# Patient Record
Sex: Female | Born: 1940
Health system: Southern US, Community
[De-identification: ages and names within clinical notes are randomized; demographics above are authoritative.]

## PROBLEM LIST (undated history)

## (undated) DIAGNOSIS — F419 Anxiety disorder, unspecified: Secondary | ICD-10-CM

## (undated) DIAGNOSIS — M109 Gout, unspecified: Secondary | ICD-10-CM

## (undated) DIAGNOSIS — R5382 Chronic fatigue, unspecified: Secondary | ICD-10-CM

## (undated) DIAGNOSIS — J449 Chronic obstructive pulmonary disease, unspecified: Secondary | ICD-10-CM

## (undated) DIAGNOSIS — M6281 Muscle weakness (generalized): Secondary | ICD-10-CM

## (undated) DIAGNOSIS — D649 Anemia, unspecified: Secondary | ICD-10-CM

## (undated) DIAGNOSIS — Z9181 History of falling: Secondary | ICD-10-CM

## (undated) DIAGNOSIS — E039 Hypothyroidism, unspecified: Secondary | ICD-10-CM

## (undated) DIAGNOSIS — M069 Rheumatoid arthritis, unspecified: Secondary | ICD-10-CM

## (undated) DIAGNOSIS — I2510 Atherosclerotic heart disease of native coronary artery without angina pectoris: Secondary | ICD-10-CM

## (undated) DIAGNOSIS — E7800 Pure hypercholesterolemia, unspecified: Secondary | ICD-10-CM

## (undated) DIAGNOSIS — M48061 Spinal stenosis, lumbar region without neurogenic claudication: Secondary | ICD-10-CM

## (undated) DIAGNOSIS — E079 Disorder of thyroid, unspecified: Secondary | ICD-10-CM

## (undated) DIAGNOSIS — R262 Difficulty in walking, not elsewhere classified: Secondary | ICD-10-CM

## (undated) DIAGNOSIS — N189 Chronic kidney disease, unspecified: Secondary | ICD-10-CM

## (undated) DIAGNOSIS — R279 Unspecified lack of coordination: Secondary | ICD-10-CM

## (undated) DIAGNOSIS — F329 Major depressive disorder, single episode, unspecified: Secondary | ICD-10-CM

## (undated) DIAGNOSIS — J849 Interstitial pulmonary disease, unspecified: Secondary | ICD-10-CM

## (undated) DIAGNOSIS — IMO0001 Reserved for inherently not codable concepts without codable children: Secondary | ICD-10-CM

## (undated) DIAGNOSIS — J189 Pneumonia, unspecified organism: Secondary | ICD-10-CM

## (undated) DIAGNOSIS — I10 Essential (primary) hypertension: Secondary | ICD-10-CM

## (undated) DIAGNOSIS — S72002A Fracture of unspecified part of neck of left femur, initial encounter for closed fracture: Secondary | ICD-10-CM

## (undated) DIAGNOSIS — M1990 Unspecified osteoarthritis, unspecified site: Secondary | ICD-10-CM

## (undated) DIAGNOSIS — I5033 Acute on chronic diastolic (congestive) heart failure: Secondary | ICD-10-CM

## (undated) DIAGNOSIS — I6529 Occlusion and stenosis of unspecified carotid artery: Secondary | ICD-10-CM

## (undated) DIAGNOSIS — G709 Myoneural disorder, unspecified: Secondary | ICD-10-CM

## (undated) DIAGNOSIS — I5189 Other ill-defined heart diseases: Secondary | ICD-10-CM

## (undated) DIAGNOSIS — S8002XA Contusion of left knee, initial encounter: Secondary | ICD-10-CM

## (undated) DIAGNOSIS — R0602 Shortness of breath: Secondary | ICD-10-CM

## (undated) DIAGNOSIS — D689 Coagulation defect, unspecified: Secondary | ICD-10-CM

## (undated) DIAGNOSIS — Z8249 Family history of ischemic heart disease and other diseases of the circulatory system: Secondary | ICD-10-CM

## (undated) DIAGNOSIS — D329 Benign neoplasm of meninges, unspecified: Secondary | ICD-10-CM

## (undated) DIAGNOSIS — J069 Acute upper respiratory infection, unspecified: Secondary | ICD-10-CM

## (undated) DIAGNOSIS — Z0389 Encounter for observation for other suspected diseases and conditions ruled out: Principal | ICD-10-CM

## (undated) DIAGNOSIS — J45909 Unspecified asthma, uncomplicated: Secondary | ICD-10-CM

## (undated) HISTORY — PX: CYST EXCISION: SHX5701

## (undated) HISTORY — PX: SHOULDER SURGERY: SHX246

## (undated) HISTORY — PX: ABDOMINAL HYSTERECTOMY: SHX81

## (undated) HISTORY — DX: Other ill-defined heart diseases: I51.89

## (undated) HISTORY — DX: Morbid (severe) obesity due to excess calories: E66.01

## (undated) HISTORY — PX: OVARY SURGERY: SHX727

## (undated) HISTORY — DX: Acute upper respiratory infection, unspecified: J06.9

## (undated) HISTORY — DX: Reserved for inherently not codable concepts without codable children: IMO0001

## (undated) HISTORY — DX: Encounter for observation for other suspected diseases and conditions ruled out: Z03.89

## (undated) HISTORY — PX: BRAIN SURGERY: SHX531

## (undated) HISTORY — DX: Chronic kidney disease, unspecified: N18.9

## (undated) HISTORY — DX: Interstitial pulmonary disease, unspecified: J84.9

## (undated) HISTORY — DX: Anxiety disorder, unspecified: F41.9

## (undated) HISTORY — PX: VAGINAL HYSTERECTOMY: SUR661

## (undated) HISTORY — DX: Shortness of breath: R06.02

## (undated) HISTORY — DX: Benign neoplasm of meninges, unspecified: D32.9

## (undated) HISTORY — DX: Pneumonia, unspecified organism: J18.9

## (undated) HISTORY — PX: TONSILLECTOMY: SUR1361

## (undated) HISTORY — DX: Family history of ischemic heart disease and other diseases of the circulatory system: Z82.49

## (undated) HISTORY — PX: CYST REMOVAL NECK: SHX6281

---

## 1998-05-19 HISTORY — PX: OTHER SURGICAL HISTORY: SHX169

## 1999-10-25 ENCOUNTER — Ambulatory Visit (HOSPITAL_COMMUNITY): Admission: RE | Admit: 1999-10-25 | Discharge: 1999-10-25 | Payer: Self-pay | Admitting: Orthopedic Surgery

## 2000-01-31 ENCOUNTER — Ambulatory Visit (HOSPITAL_BASED_OUTPATIENT_CLINIC_OR_DEPARTMENT_OTHER): Admission: RE | Admit: 2000-01-31 | Discharge: 2000-01-31 | Payer: Self-pay | Admitting: Orthopedic Surgery

## 2002-12-29 ENCOUNTER — Ambulatory Visit (HOSPITAL_COMMUNITY): Admission: RE | Admit: 2002-12-29 | Discharge: 2002-12-29 | Payer: Self-pay | Admitting: Interventional Cardiology

## 2003-06-12 ENCOUNTER — Encounter: Admission: RE | Admit: 2003-06-12 | Discharge: 2003-06-12 | Payer: Self-pay | Admitting: Internal Medicine

## 2003-06-18 ENCOUNTER — Emergency Department (HOSPITAL_COMMUNITY): Admission: EM | Admit: 2003-06-18 | Discharge: 2003-06-19 | Payer: Self-pay | Admitting: *Deleted

## 2004-01-08 ENCOUNTER — Encounter: Admission: RE | Admit: 2004-01-08 | Discharge: 2004-01-08 | Payer: Self-pay | Admitting: Family Medicine

## 2004-03-13 ENCOUNTER — Encounter (INDEPENDENT_AMBULATORY_CARE_PROVIDER_SITE_OTHER): Payer: Self-pay | Admitting: *Deleted

## 2004-03-13 ENCOUNTER — Observation Stay (HOSPITAL_COMMUNITY): Admission: EM | Admit: 2004-03-13 | Discharge: 2004-03-14 | Payer: Self-pay | Admitting: *Deleted

## 2005-08-26 ENCOUNTER — Emergency Department (HOSPITAL_COMMUNITY): Admission: EM | Admit: 2005-08-26 | Discharge: 2005-08-26 | Payer: Self-pay | Admitting: Family Medicine

## 2005-08-27 ENCOUNTER — Emergency Department (HOSPITAL_COMMUNITY): Admission: EM | Admit: 2005-08-27 | Discharge: 2005-08-27 | Payer: Self-pay | Admitting: Family Medicine

## 2005-08-28 ENCOUNTER — Emergency Department (HOSPITAL_COMMUNITY): Admission: EM | Admit: 2005-08-28 | Discharge: 2005-08-28 | Payer: Self-pay | Admitting: Family Medicine

## 2005-08-29 ENCOUNTER — Emergency Department (HOSPITAL_COMMUNITY): Admission: EM | Admit: 2005-08-29 | Discharge: 2005-08-29 | Payer: Self-pay | Admitting: Family Medicine

## 2005-11-27 ENCOUNTER — Emergency Department (HOSPITAL_COMMUNITY): Admission: EM | Admit: 2005-11-27 | Discharge: 2005-11-27 | Payer: Self-pay | Admitting: Emergency Medicine

## 2005-12-10 ENCOUNTER — Emergency Department (HOSPITAL_COMMUNITY): Admission: EM | Admit: 2005-12-10 | Discharge: 2005-12-10 | Payer: Self-pay | Admitting: Family Medicine

## 2006-02-19 ENCOUNTER — Emergency Department (HOSPITAL_COMMUNITY): Admission: EM | Admit: 2006-02-19 | Discharge: 2006-02-19 | Payer: Self-pay | Admitting: Family Medicine

## 2006-03-25 ENCOUNTER — Encounter: Admission: RE | Admit: 2006-03-25 | Discharge: 2006-03-25 | Payer: Self-pay | Admitting: Internal Medicine

## 2006-06-07 ENCOUNTER — Emergency Department (HOSPITAL_COMMUNITY): Admission: EM | Admit: 2006-06-07 | Discharge: 2006-06-07 | Payer: Self-pay | Admitting: Family Medicine

## 2006-09-25 ENCOUNTER — Emergency Department (HOSPITAL_COMMUNITY): Admission: EM | Admit: 2006-09-25 | Discharge: 2006-09-25 | Payer: Self-pay | Admitting: Emergency Medicine

## 2006-11-18 ENCOUNTER — Encounter: Admission: RE | Admit: 2006-11-18 | Discharge: 2006-11-18 | Payer: Self-pay | Admitting: Internal Medicine

## 2006-11-24 ENCOUNTER — Emergency Department (HOSPITAL_COMMUNITY): Admission: EM | Admit: 2006-11-24 | Discharge: 2006-11-24 | Payer: Self-pay | Admitting: Emergency Medicine

## 2006-12-08 ENCOUNTER — Ambulatory Visit (HOSPITAL_COMMUNITY): Admission: RE | Admit: 2006-12-08 | Discharge: 2006-12-08 | Payer: Self-pay | Admitting: Internal Medicine

## 2007-02-25 ENCOUNTER — Encounter: Admission: RE | Admit: 2007-02-25 | Discharge: 2007-02-25 | Payer: Self-pay | Admitting: Internal Medicine

## 2007-07-12 ENCOUNTER — Encounter: Admission: RE | Admit: 2007-07-12 | Discharge: 2007-08-11 | Payer: Self-pay | Admitting: Internal Medicine

## 2008-03-14 ENCOUNTER — Encounter: Admission: RE | Admit: 2008-03-14 | Discharge: 2008-03-14 | Payer: Self-pay | Admitting: Internal Medicine

## 2008-07-18 ENCOUNTER — Encounter: Admission: RE | Admit: 2008-07-18 | Discharge: 2008-07-18 | Payer: Self-pay | Admitting: Internal Medicine

## 2010-01-02 ENCOUNTER — Encounter: Admission: RE | Admit: 2010-01-02 | Discharge: 2010-01-02 | Payer: Self-pay | Admitting: Internal Medicine

## 2010-03-14 ENCOUNTER — Encounter
Admission: RE | Admit: 2010-03-14 | Discharge: 2010-03-14 | Payer: Self-pay | Admitting: Physical Medicine and Rehabilitation

## 2010-07-12 ENCOUNTER — Ambulatory Visit (INDEPENDENT_AMBULATORY_CARE_PROVIDER_SITE_OTHER): Payer: Medicare Other

## 2010-07-12 ENCOUNTER — Inpatient Hospital Stay (INDEPENDENT_AMBULATORY_CARE_PROVIDER_SITE_OTHER)
Admission: RE | Admit: 2010-07-12 | Discharge: 2010-07-12 | Disposition: A | Discharge status: Home / Self Care | Payer: Medicare Other | Source: Ambulatory Visit | Attending: Family Medicine | Admitting: Family Medicine

## 2010-07-12 DIAGNOSIS — M1990 Unspecified osteoarthritis, unspecified site: Secondary | ICD-10-CM

## 2010-10-04 NOTE — H&P (Signed)
NAMEEMY, ANGEVINE NO.:  [id number]   MEDICAL RECORD NO.:  [id number]          PATIENT TYPE:  EMS   LOCATION:  [facility]                         FACILITY:  [facility]   PHYSICIAN:  Virgilio Belling. Collman, MDDATE OF BIRTH:  [birth year]   DATE OF ADMISSION:  03/12/2004  DATE OF DISCHARGE:                                HISTORY & PHYSICAL   IDENTIFYING DATA AND CHIEF COMPLAINT:  [name] is a 70 year old white  woman who is admitted for further evaluation of chest discomfort.   HISTORY OF PRESENT ILLNESS:  The patient presented to the emergency  department with a one-week history of vague chest fullness and heaviness.  This has been present throughout this period.  There are no exacerbating or  ameliorating factors.  It appears to be unrelated to position, activity,  meals or respirations.  At one point she felt some chest congestion and felt  as if she was developing an upper respiratory infection, but these symptoms  have not progressed any further.  She reports no cough, sputum production,  fever or chills.  Her other complaint this evening is a tenderness and  fullness in her left upper arm.  There has been no injury.  The patient has  noted some slight shortness of breath during this wee.  There has been no  diaphoresis or nausea.   PAST MEDICAL HISTORY:  The patient reports that she underwent cardiac  catheterization approximately two years ago.  This, according to her  recollection, demonstrated aneurysmal coronary arteries, but no coronary  artery lesions.  The patient has a history of hypertension and dyslipidemia.  There is no history of diabetes mellitus or smoking.  There is a family  history of coronary disease; her mother suffered a myocardial infarction in  her 63s.   In addition to the aforementioned problems the patient has a history of  rheumatoid arthritis.  There is no history of congestive heart failure or  arrhythmia.   MEDICATIONS:  The  patient is on a number medicines for the aforementioned  problems.  These include prednisone, Norvasc, Toprol XL, Lipitor, folic  acid, and hydroxychloroquine.   PAST SURGICAL HISTORY:  1.  Hysterectomy.  2.  Repair of nerve impingement in her left shoulder.   FAMILY HISTORY:  The patient's father died of lung cancer.  The patient's  mother died of a myocardial infarction.   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  She lives with her husband.  She does not smoke or drink.   REVIEW OF SYSTEMS:  Review of systems reveals no new problems related to her  head, eyes, ears, nose, mouth, throat, lungs, gastrointestinal system,  genitourinary system, or extremities.  There is no history of neurologic or  psychiatric disorder.  There is no history of fever, chills or weight loss.   PHYSICAL EXAMINATION:  VITAL SIGNS: Blood pressure 146/95, pulse 96 and  regular, respirations 16, and temperature 97.0.  GENERAL APPEARANCE:  The patient is an obese older white woman in no  discomfort.  She is alert, oriented, appropriate and responsive.  HEENT:  Head, eyes, nose and mouth are normal.  NECK:  The neck is without thyromegaly or adenopathy.  Carotid pulses are  palpable bilaterally an without bruits.  HEART:  Cardiac examination reveals a normal S1 and S2.  There is no S3, S4,  murmur, rub, or click.  Cardiac rhythm is regular.  CHEST:  No chest wall tenderness is noted.  LUNGS:  The lungs are clear.  ABDOMEN:  The abdomen is soft and nontender.  There is no mass,  hepatosplenomegaly, bruits, distention, rebound, guarding, or rigidity.  Bowel sounds are normal.  BREASTS, PELVIC AND RECTAL:  Breast, pelvic and rectal examinations are not  performed as they are not pertinent to the reason for acute care  hospitalization.  EXTREMITIES:  The extremities are without deviation or deformity.  There is  mild bilateral pedal edema.  Radial and dorsalis pedal pulses are  palpable  bilaterally.  There is mild tenderness on palpation of the distal left  forearm.  There is no erythema, cords or swelling.  NEUROLOGIC EXAMINATION:  Brief screening neurologic survey is unremarkable.   LABORATORY DATA:  The electrocardiogram was normal.  The chest radiograph,  according to the radiologist, demonstrated no evidence of acute disease.  There was resolution of the prior pneumonia.  All laboratory studies were  pending at the time of this dictation.   IMPRESSION:  1.  Chest fullness, rule out coronary artery disease.  According to the patient there was no evidence of coronary artery disease,  other than aneurysmal dilatation, by cardiac catheterization approximately  two years ago.  1.  Left upper arm tenderness.  2.  Hypertension.  3.  Dyslipidemia.  4.  Rheumatoid arthritis.   PLAN:  1.  Telemetry.  2.  Serial cardiac enzymes.  3.  Aspirin.  4.  Lovenox.  5.  Nitro paste.  6.  Chest CT.  7.  Echocardiogram.  8.  Further measures per Dr. Tamala Julian.       MSC/MEDQ  D:  03/13/2004  T:  03/13/2004  Job:  [id number]   cc:   Sinclair Grooms, M.D.  [address]  [address] [place]  [place] [address]  Fax: [phone]

## 2010-10-04 NOTE — Op Note (Signed)
[facility]  Patient:    [name], [name]                     MRN: [id number] Proc. Date: 01/31/00 Adm. Date:  [id number] Disc. 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  OPERATION: 1. Debridement of undersurface rotator cuff tear within the glenohumeral    joint. 2. Debridement of subtotal bursectomy with acromioplasty and take down of    the CA ligaments and the subacromial space. 3. Partial distal clavicle excision.  SURGEON:  Alta Corning, M.D.  ASSISTANT:  Alvina Filbert. Natividad Brood.  ANESTHESIA:  General with intrascalene block.  BRIEF HISTORY:  She is 70 year old female with a long history of having had neck and shoulder pain.  She ultimately was complaining of this.  She had injections into the shoulder times three.  She had some improvement of the pain with each injection but never complete resolution. She was taken to the operating room for evaluation and arthroscopy.  DESCRIPTION OF PROCEDURE:  The patient was taken to the operating room and after adequate anesthesia was obtained with general anesthesia, the patient was placed in the beach chair position.  The patient was then prepped and draped in the usual sterile fashion.  Bony prominences were all well padded at this point.  The routine arthroscopic examination of the glenohumeral joint revealed there was obvious partial thickness rotator cuff tear.  This was debrided with a combination of motorized suction shaver and then attention was turned to the biceps tendon which also had some fray on the undersurface. This was debrided.  The posterior capsule was also debrided within the glenohumeral joint.  The cannula was left in the glenohumeral  joint at this time and attention was then turned into the subacromial space.  At this point, there was a gigantic inferior extension of the acromion.  This was debrided with a motorized bur.  The CA ligament was taken down. The distal clavicle was taken down for a length of 2 cm.  Following this attention was turned towards the rotator cuff on the upper surface.  There was clearly some fraying on the superior surface too.  Subtotal bursectomy was performed as well as removal of all bony fragments and the arm was then put through a range of motion.  No evidence of full thickness tear was identified on the rotator cuff at this point.  At this point, the shoulder was copiously irrigated and suctioned dry. The wounds were closed with a single stitch and Steri-Strips.  Sterile compressive dressing was applied and the patient was placed into a shoulder sling and taken to the recovery room.  She was noted to be in satisfactory condition.  Estimated blood loss for the procedure was none. DD:  01/31/00 TD:  02/03/00 Job: [id number]

## 2011-03-04 LAB — POCT RAPID STREP A: Streptococcus, Group A Screen (Direct): POSITIVE — AB

## 2011-04-02 ENCOUNTER — Other Ambulatory Visit: Payer: Self-pay | Admitting: Internal Medicine

## 2011-04-02 DIAGNOSIS — Z780 Asymptomatic menopausal state: Secondary | ICD-10-CM

## 2011-04-02 DIAGNOSIS — Z1231 Encounter for screening mammogram for malignant neoplasm of breast: Secondary | ICD-10-CM

## 2011-04-30 ENCOUNTER — Other Ambulatory Visit: Payer: Medicare Other

## 2011-04-30 ENCOUNTER — Ambulatory Visit: Payer: Medicare Other

## 2011-05-21 ENCOUNTER — Ambulatory Visit: Payer: Medicare Other

## 2011-05-21 ENCOUNTER — Other Ambulatory Visit: Payer: Medicare Other

## 2011-05-28 ENCOUNTER — Ambulatory Visit
Admission: RE | Admit: 2011-05-28 | Discharge: 2011-05-28 | Disposition: A | Discharge status: Home / Self Care | Payer: Medicare Other | Source: Ambulatory Visit | Attending: Internal Medicine | Admitting: Internal Medicine

## 2011-05-28 ENCOUNTER — Other Ambulatory Visit: Payer: Medicare Other

## 2011-05-28 DIAGNOSIS — Z1231 Encounter for screening mammogram for malignant neoplasm of breast: Secondary | ICD-10-CM | POA: Diagnosis not present

## 2011-06-05 ENCOUNTER — Ambulatory Visit
Admission: RE | Admit: 2011-06-05 | Discharge: 2011-06-05 | Disposition: A | Discharge status: Home / Self Care | Payer: Medicare Other | Source: Ambulatory Visit | Attending: Internal Medicine | Admitting: Internal Medicine

## 2011-06-05 DIAGNOSIS — Z780 Asymptomatic menopausal state: Secondary | ICD-10-CM | POA: Diagnosis not present

## 2011-06-12 DIAGNOSIS — Z111 Encounter for screening for respiratory tuberculosis: Secondary | ICD-10-CM | POA: Diagnosis not present

## 2011-06-12 DIAGNOSIS — M069 Rheumatoid arthritis, unspecified: Secondary | ICD-10-CM | POA: Diagnosis not present

## 2011-07-02 DIAGNOSIS — Z86010 Personal history of colonic polyps: Secondary | ICD-10-CM | POA: Diagnosis not present

## 2011-07-02 DIAGNOSIS — M069 Rheumatoid arthritis, unspecified: Secondary | ICD-10-CM | POA: Diagnosis not present

## 2011-07-15 DIAGNOSIS — Z79899 Other long term (current) drug therapy: Secondary | ICD-10-CM | POA: Diagnosis not present

## 2011-07-15 DIAGNOSIS — M069 Rheumatoid arthritis, unspecified: Secondary | ICD-10-CM | POA: Diagnosis not present

## 2011-07-22 DIAGNOSIS — K649 Unspecified hemorrhoids: Secondary | ICD-10-CM | POA: Diagnosis not present

## 2011-07-22 DIAGNOSIS — Z86010 Personal history of colonic polyps: Secondary | ICD-10-CM | POA: Diagnosis not present

## 2011-07-22 DIAGNOSIS — K5730 Diverticulosis of large intestine without perforation or abscess without bleeding: Secondary | ICD-10-CM | POA: Diagnosis not present

## 2011-07-22 DIAGNOSIS — D126 Benign neoplasm of colon, unspecified: Secondary | ICD-10-CM | POA: Diagnosis not present

## 2011-08-05 DIAGNOSIS — Z79899 Other long term (current) drug therapy: Secondary | ICD-10-CM | POA: Diagnosis not present

## 2011-08-05 DIAGNOSIS — M7989 Other specified soft tissue disorders: Secondary | ICD-10-CM | POA: Diagnosis not present

## 2011-08-05 DIAGNOSIS — M069 Rheumatoid arthritis, unspecified: Secondary | ICD-10-CM | POA: Diagnosis not present

## 2011-09-11 ENCOUNTER — Encounter (HOSPITAL_COMMUNITY): Payer: Self-pay | Admitting: *Deleted

## 2011-09-11 ENCOUNTER — Emergency Department (INDEPENDENT_AMBULATORY_CARE_PROVIDER_SITE_OTHER)
Admission: EM | Admit: 2011-09-11 | Discharge: 2011-09-11 | Disposition: A | Discharge status: Home / Self Care | Payer: Medicare Other | Source: Home / Self Care

## 2011-09-11 DIAGNOSIS — N61 Mastitis without abscess: Secondary | ICD-10-CM | POA: Diagnosis not present

## 2011-09-11 DIAGNOSIS — N611 Abscess of the breast and nipple: Secondary | ICD-10-CM

## 2011-09-11 HISTORY — DX: Unspecified osteoarthritis, unspecified site: M19.90

## 2011-09-11 HISTORY — DX: Pure hypercholesterolemia, unspecified: E78.00

## 2011-09-11 HISTORY — DX: Essential (primary) hypertension: I10

## 2011-09-11 HISTORY — DX: Disorder of thyroid, unspecified: E07.9

## 2011-09-11 NOTE — ED Provider Notes (Signed)
[name] is a 71 y.o. female who presents to Urgent Care today for breast abscess. Started 3 days ago and has worsened. Draining just a tiny day. No fevers or chills. Feels well otherwise. No history of previous breast abscess.   PMH reviewed. Significant for immunosuppression with Plaquenil ROS as above otherwise neg.  no chest pains, palpitations, fevers, chills, abdominal pain nausea or vomiting. Medications reviewed. No current facility-administered medications for this encounter.   Current Outpatient Prescriptions  Medication Sig Dispense Refill  . acetaminophen (TYLENOL) 500 MG tablet Take 500 mg by mouth 2 (two) times daily.      [name] atorvastatin (LIPITOR) 20 MG tablet Take 20 mg by mouth daily.      . colchicine 0.6 MG tablet Take 0.6 mg by mouth daily.      . hydroxychloroquine (PLAQUENIL) 200 MG tablet Take 200 mg by mouth daily.      [name] levothyroxine (SYNTHROID, LEVOTHROID) 50 MCG tablet Take 50 mcg by mouth daily.      . Methotrexate Sodium (METHOTREXATE PO) Take by mouth.      . metoprolol succinate (TOPROL-XL) 100 MG 24 hr tablet Take 100 mg by mouth daily. Take with or immediately following a meal.      . olmesartan (BENICAR) 40 MG tablet Take 40 mg by mouth daily.      . predniSONE (DELTASONE) 5 MG tablet Take 5 mg by mouth daily.      [name] PRESCRIPTION MEDICATION       . spironolactone (ALDACTONE) 50 MG tablet Take 50 mg by mouth daily.      . traMADol (ULTRAM ER) 100 MG 24 hr tablet Take 100 mg by mouth 2 (two) times daily.        Exam:  BP 134/75  Pulse 90  Temp(Src) 98.4 F (36.9 C) (Oral)  Resp 20  SpO2 96% Gen: Well NAD BREAST: Inferior aspect of right breast is erythematous and indurated with area of fluctuance approximately nickel size. Very tender. Fluctuance extends approximately 3 cm.  Procedure note:  Consent obtained and timeout performed. Area cleaned with alcohol and 5 mL of lidocaine with epinephrine were injected overlying the abscess. A scalpel  is used to cut down to the abscess and a triangle of skin was removed.  The abscess was then drained and explored with forceps.  All loculations were broken up. The abscess was then packed and a dressing was applied.  Patient tolerated the procedure well  No results found for this or any previous visit (from the past 24 hour(s)). No results found.  Assessment and Plan: 71 y.o. female with breast abscess status post incision and drainage. Plan to followup with primary care doctor on Wednesday.  Advised patient to remove packing herself over the weekend.  Discussed warning signs for worsening infection such as fevers chills worsening pain. She expresses understanding. See discharge instructions.     Gregor Hams, MD 09/11/11 2059

## 2011-09-11 NOTE — ED Notes (Signed)
Pt c/o boil to under right breast onset 3 days ago.  Has drained some.  Large open boil noted to underside of right breast with redness and warmth over most of breast.  Hx of MRSA.

## 2011-09-11 NOTE — Discharge Instructions (Signed)
Thank you for coming in today. Please follow up with your doctor in 1 week.  Go to the ER if you have high fever or worsening pain.  Remove about 2 inches of the ribbon on Friday and remove all of it on Monday.  It is ok if the ribbon just falls out.

## 2011-09-13 NOTE — ED Provider Notes (Signed)
Medical screening examination/treatment/procedure(s) were performed by a resident physician and as supervising physician I was immediately available for consultation/collaboration.  Philipp Deputy, M.D.   Harden Mo, MD 09/13/11 [phone]

## 2011-09-15 ENCOUNTER — Emergency Department (INDEPENDENT_AMBULATORY_CARE_PROVIDER_SITE_OTHER)
Admission: EM | Admit: 2011-09-15 | Discharge: 2011-09-15 | Disposition: A | Discharge status: Home / Self Care | Payer: Medicare Other | Source: Home / Self Care | Attending: Emergency Medicine | Admitting: Emergency Medicine

## 2011-09-15 ENCOUNTER — Encounter (HOSPITAL_COMMUNITY): Payer: Self-pay | Admitting: Emergency Medicine

## 2011-09-15 DIAGNOSIS — Z5189 Encounter for other specified aftercare: Secondary | ICD-10-CM | POA: Diagnosis not present

## 2011-09-15 MED ORDER — SULFAMETHOXAZOLE-TRIMETHOPRIM 800-160 MG PO TABS: 1.0000 | ORAL_TABLET | Freq: Two times a day (BID) | ORAL | Status: AC

## 2011-09-15 MED ORDER — SULFAMETHOXAZOLE-TRIMETHOPRIM 800-160 MG PO TABS: 1.0000 | ORAL_TABLET | Freq: Two times a day (BID) | ORAL | Status: DC

## 2011-09-15 NOTE — ED Notes (Addendum)
Pt here for abscess recheck for boil under right breast s/p I/D WITH PACKING ON Thursday.PT STATES SX HAS WORSENED WITH INCREASED PUS DRAINAGE,REDNESS AND SHARP PAINS.ERYTHEMIA  SEEN WITH PEELED SKIN AROUND BREAST AND MOD DRAINAGE.DENIES FEVER,CHILLS,N,V.PT WAS NOT PRESCRIBED ATB'S

## 2011-09-15 NOTE — Discharge Instructions (Signed)
Warm compress twice a day when you take the antibiotic, take all of medicine, return on thurs for recheck,

## 2011-09-15 NOTE — ED Notes (Signed)
4X4 GAUZE AND MEDI PORE APPLIED UNDER RIGHT BREAST WOUND.PT TO RETURN ON THURS FOR RECHECK

## 2011-09-15 NOTE — ED Provider Notes (Signed)
History     CSN: [id number]  Arrival date & time 09/15/11  1211   First MD Initiated Contact with Patient 09/15/11 1323      Chief Complaint  Patient presents with  . Wound Check  . Abscess    (Consider location/radiation/quality/duration/timing/severity/associated sxs/prior treatment) Patient is a 71 y.o. female presenting with wound check and abscess. The history is provided by the patient.  Wound Check  She was treated in the ED 3 to 5 days ago. Previous treatment in the ED includes I&D of abscess. There has been no treatment since the wound repair. Her temperature was unmeasured prior to arrival. There has been colored discharge from the wound. The redness has not changed. The swelling has not changed. The pain has new pain.  Abscess     Past Medical History  Diagnosis Date  . Diabetes mellitus   . Hypertension   . Thyroid disease   . Arthritis   . High cholesterol     Past Surgical History  Procedure Date  . Abdominal hysterectomy   . Ovary surgery   . Shoulder surgery     No family history on file.  History  Substance Use Topics  . Smoking status: Never Smoker   . Smokeless tobacco: Not on file  . Alcohol Use: No    OB History    Grav Para Term Preterm Abortions TAB SAB Ect Mult Living                  Review of Systems  Constitutional: Negative.   Skin: Positive for wound.    Allergies  Codeine; Remicade; and Zestril  Home Medications   Current Outpatient Rx  Name Route Sig Dispense Refill  . ACETAMINOPHEN 500 MG PO TABS Oral Take 500 mg by mouth 2 (two) times daily.    . ATORVASTATIN CALCIUM 20 MG PO TABS Oral Take 20 mg by mouth daily.    . COLCHICINE 0.6 MG PO TABS Oral Take 0.6 mg by mouth daily.    [name] HYDROXYCHLOROQUINE SULFATE 200 MG PO TABS Oral Take 200 mg by mouth daily.    [name] LEVOTHYROXINE SODIUM 50 MCG PO TABS Oral Take 50 mcg by mouth daily.    [name] METHOTREXATE PO Oral Take by mouth.    . METOPROLOL SUCCINATE ER 100 MG PO TB24  Oral Take 100 mg by mouth daily. Take with or immediately following a meal.    . OLMESARTAN MEDOXOMIL 40 MG PO TABS Oral Take 40 mg by mouth daily.    [name] PREDNISONE 5 MG PO TABS Oral Take 5 mg by mouth daily.    [name] PRESCRIPTION MEDICATION      . SPIRONOLACTONE 50 MG PO TABS Oral Take 50 mg by mouth daily.    . SULFAMETHOXAZOLE-TRIMETHOPRIM 800-160 MG PO TABS Oral Take 1 tablet by mouth 2 (two) times daily. 20 tablet 0  . TRAMADOL HCL ER 100 MG PO TB24 Oral Take 100 mg by mouth 2 (two) times daily.      BP 104/67  Pulse 88  Temp(Src) 97.6 F (36.4 C) (Oral)  Resp 20  SpO2 98%  Physical Exam  Nursing note and vitals reviewed. Constitutional: She is oriented to person, place, and time. She appears well-developed and well-nourished.  Neurological: She is alert and oriented to person, place, and time.  Skin: Skin is warm and dry.       Right breast abscess open and draining, purulent fluid drained from loculated pocket, mod erythema and tenderness.  ED Course  Procedures (including critical care time)  Labs Reviewed - No data to display No results found.   1. Encounter for wound re-check       MDM  dsg change, probed with q-tip for loculations.dsd        Billy Fischer, MD 09/15/11 1526

## 2011-09-18 ENCOUNTER — Emergency Department (INDEPENDENT_AMBULATORY_CARE_PROVIDER_SITE_OTHER)
Admission: EM | Admit: 2011-09-18 | Discharge: 2011-09-18 | Disposition: A | Discharge status: Home / Self Care | Payer: Medicare Other | Source: Home / Self Care | Attending: Family Medicine | Admitting: Family Medicine

## 2011-09-18 ENCOUNTER — Encounter (HOSPITAL_COMMUNITY): Payer: Self-pay | Admitting: Emergency Medicine

## 2011-09-18 DIAGNOSIS — N61 Mastitis without abscess: Secondary | ICD-10-CM | POA: Diagnosis not present

## 2011-09-18 DIAGNOSIS — N611 Abscess of the breast and nipple: Secondary | ICD-10-CM

## 2011-09-18 MED ORDER — MUPIROCIN CALCIUM 2 % EX CREA: TOPICAL_CREAM | Freq: Three times a day (TID) | CUTANEOUS | Status: AC

## 2011-09-18 NOTE — Discharge Instructions (Signed)
Continue your excellent care, finish antibiotic, return on tues for recheck.

## 2011-09-18 NOTE — ED Notes (Signed)
PRESSURE DRESSING APPLIED,BACITRACIN OINT AND COVERED WITH MEDIPORE TAPE S/P DEBRIDEMENT

## 2011-09-18 NOTE — ED Notes (Signed)
Pt here for recheck of wound under right breast s/p I/D AND ATB TREATMENT SEEN LAST WEEK AND Monday FOR INCREASED DRAINAGE AND CELLULITIS.PT STATES THE PAIN HAS DECREASED BUT CONT MODERATE THICK,BROWNISH DRAINAGE AND ERYTHEMA NOTED.NO FEVERS,CHILLS

## 2011-09-18 NOTE — ED Provider Notes (Signed)
History     CSN: [id number]  Arrival date & time 09/18/11  1343   First MD Initiated Contact with Patient 09/18/11 1503      Chief Complaint  Patient presents with  . Wound Check    (Consider location/radiation/quality/duration/timing/severity/associated sxs/prior treatment) Patient is a 71 y.o. female presenting with wound check. The history is provided by the patient.  Wound Check  She was treated in the ED 5 to 10 days ago. Previous treatment in the ED includes I&D of abscess and oral antibiotics. Treatments since wound repair include oral antibiotics and a wound recheck (seen 4/29 for f/u and sx much improved, taking septra.). There has been colored discharge from the wound. The redness has improved. The swelling has improved. The pain has improved.    Past Medical History  Diagnosis Date  . Diabetes mellitus   . Hypertension   . Thyroid disease   . Arthritis   . High cholesterol     Past Surgical History  Procedure Date  . Abdominal hysterectomy   . Ovary surgery   . Shoulder surgery     No family history on file.  History  Substance Use Topics  . Smoking status: Never Smoker   . Smokeless tobacco: Not on file  . Alcohol Use: No    OB History    Grav Para Term Preterm Abortions TAB SAB Ect Mult Living                  Review of Systems  Constitutional: Negative.   Skin: Positive for wound.    Allergies  Codeine; Remicade; and Zestril  Home Medications   Current Outpatient Rx  Name Route Sig Dispense Refill  . ACETAMINOPHEN 500 MG PO TABS Oral Take 500 mg by mouth 2 (two) times daily.    . ATORVASTATIN CALCIUM 20 MG PO TABS Oral Take 20 mg by mouth daily.    . COLCHICINE 0.6 MG PO TABS Oral Take 0.6 mg by mouth daily.    [name] HYDROXYCHLOROQUINE SULFATE 200 MG PO TABS Oral Take 200 mg by mouth daily.    [name] LEVOTHYROXINE SODIUM 50 MCG PO TABS Oral Take 50 mcg by mouth daily.    [name] METHOTREXATE PO Oral Take by mouth.    . METOPROLOL SUCCINATE ER 100 MG  PO TB24 Oral Take 100 mg by mouth daily. Take with or immediately following a meal.    . MUPIROCIN CALCIUM 2 % EX CREA Topical Apply topically 3 (three) times daily. 30 g 0  . OLMESARTAN MEDOXOMIL 40 MG PO TABS Oral Take 40 mg by mouth daily.    [name] PREDNISONE 5 MG PO TABS Oral Take 5 mg by mouth daily.    [name] PRESCRIPTION MEDICATION      . SPIRONOLACTONE 50 MG PO TABS Oral Take 50 mg by mouth daily.    . SULFAMETHOXAZOLE-TRIMETHOPRIM 800-160 MG PO TABS Oral Take 1 tablet by mouth 2 (two) times daily. 20 tablet 0  . TRAMADOL HCL ER 100 MG PO TB24 Oral Take 100 mg by mouth 2 (two) times daily.      BP 108/75  Pulse 80  Temp(Src) 97.8 F (36.6 C) (Oral)  Resp 20  SpO2 96%  Physical Exam  Nursing note and vitals reviewed. Constitutional: She appears well-developed and well-nourished.  Skin: Skin is warm and dry. There is erythema.       Healing secondarily right breast abscess wound, min drainage., skin edges debrided , cleansed,dsd.    ED Course  Procedures (including critical care time)  Labs Reviewed - No data to display No results found.   1. Abscess of right breast       MDM          Billy Fischer, MD 09/18/11 [id number]

## 2011-09-23 ENCOUNTER — Encounter (HOSPITAL_COMMUNITY): Payer: Self-pay | Admitting: *Deleted

## 2011-09-23 ENCOUNTER — Emergency Department (INDEPENDENT_AMBULATORY_CARE_PROVIDER_SITE_OTHER)
Admission: EM | Admit: 2011-09-23 | Discharge: 2011-09-23 | Disposition: A | Discharge status: Home / Self Care | Payer: Medicare Other | Source: Home / Self Care | Attending: Family Medicine | Admitting: Family Medicine

## 2011-09-23 DIAGNOSIS — IMO0001 Reserved for inherently not codable concepts without codable children: Secondary | ICD-10-CM

## 2011-09-23 DIAGNOSIS — N61 Mastitis without abscess: Secondary | ICD-10-CM

## 2011-09-23 MED ORDER — SILVER SULFADIAZINE 1 % EX CREA: TOPICAL_CREAM | Freq: Two times a day (BID) | CUTANEOUS | Status: DC

## 2011-09-23 NOTE — ED Notes (Signed)
Pt is here for a recheck of abscess right breast which started 2 weeks ago.  She has been taking all her bactrim

## 2011-09-23 NOTE — Discharge Instructions (Signed)
Continue your excellent care and hopefully visit in 1 week will be the last.

## 2011-09-23 NOTE — ED Notes (Signed)
Wound dressed with silvadine ointment, 4x4s and paper tape

## 2011-09-23 NOTE — ED Provider Notes (Signed)
History     CSN: [id number]  Arrival date & time 09/23/11  1150   First MD Initiated Contact with Patient 09/23/11 1156      Chief Complaint  Patient presents with  . Abscess    (Consider location/radiation/quality/duration/timing/severity/associated sxs/prior treatment) Patient is a 71 y.o. female presenting with abscess. The history is provided by the patient.  Abscess  This is a new problem. Episode onset: here for f/u of right breast abscess, stopped draining yest. The problem has been gradually improving. The problem is moderate. Recently, medical care has been given at this facility.    Past Medical History  Diagnosis Date  . Diabetes mellitus   . Hypertension   . Thyroid disease   . Arthritis   . High cholesterol     Past Surgical History  Procedure Date  . Abdominal hysterectomy   . Ovary surgery   . Shoulder surgery     Family History  Problem Relation Age of Onset  . Diabetes Mother   . Hypertension Father   . Diabetes Sister   . Diabetes Brother   . Hypertension Brother     History  Substance Use Topics  . Smoking status: Never Smoker   . Smokeless tobacco: Not on file  . Alcohol Use: No    OB History    Grav Para Term Preterm Abortions TAB SAB Ect Mult Living                  Review of Systems  Constitutional: Negative.   Skin: Positive for wound.    Allergies  Codeine; Remicade; and Zestril  Home Medications   Current Outpatient Rx  Name Route Sig Dispense Refill  . ACETAMINOPHEN 500 MG PO TABS Oral Take 500 mg by mouth 2 (two) times daily.    . ATORVASTATIN CALCIUM 20 MG PO TABS Oral Take 20 mg by mouth daily.    . COLCHICINE 0.6 MG PO TABS Oral Take 0.6 mg by mouth daily.    [name] HYDROXYCHLOROQUINE SULFATE 200 MG PO TABS Oral Take 200 mg by mouth daily.    [name] LEVOTHYROXINE SODIUM 50 MCG PO TABS Oral Take 50 mcg by mouth daily.    [name] METHOTREXATE PO Oral Take by mouth.    . METOPROLOL SUCCINATE ER 100 MG PO TB24 Oral Take 100 mg by  mouth daily. Take with or immediately following a meal.    . MUPIROCIN CALCIUM 2 % EX CREA Topical Apply topically 3 (three) times daily. 30 g 0  . OLMESARTAN MEDOXOMIL 40 MG PO TABS Oral Take 40 mg by mouth daily.    [name] PREDNISONE 5 MG PO TABS Oral Take 5 mg by mouth daily.    [name] SPIRONOLACTONE 50 MG PO TABS Oral Take 50 mg by mouth daily.    . SULFAMETHOXAZOLE-TRIMETHOPRIM 800-160 MG PO TABS Oral Take 1 tablet by mouth 2 (two) times daily. 20 tablet 0  . TRAMADOL HCL ER 100 MG PO TB24 Oral Take 100 mg by mouth 2 (two) times daily.    [name] PRESCRIPTION MEDICATION      . SILVER SULFADIAZINE 1 % EX CREA Topical Apply topically 2 (two) times daily. After washing previous cream off. 50 g 0    BP 141/100  Pulse 84  Temp(Src) 97.8 F (36.6 C) (Oral)  Resp 20  SpO2 96%  Physical Exam  Nursing note and vitals reviewed. Constitutional: She appears well-developed and well-nourished.  Pulmonary/Chest: She exhibits no tenderness.  Skin: Skin is warm and dry.  Right breast wound, sub-q tissue debrided to clean edges, no infection, has clean base, silvadene dsg applied.    ED Course  Procedures (including critical care time)  Labs Reviewed - No data to display No results found.   1. Wound abscess, subsequent encounter       MDM  Debridement and silvadene dsg.        Billy Fischer, MD 09/24/11 2102

## 2011-09-30 ENCOUNTER — Emergency Department (INDEPENDENT_AMBULATORY_CARE_PROVIDER_SITE_OTHER)
Admission: EM | Admit: 2011-09-30 | Discharge: 2011-09-30 | Disposition: A | Discharge status: Home / Self Care | Payer: Medicare Other | Source: Home / Self Care | Attending: Emergency Medicine | Admitting: Emergency Medicine

## 2011-09-30 ENCOUNTER — Encounter (HOSPITAL_COMMUNITY): Payer: Self-pay | Admitting: Emergency Medicine

## 2011-09-30 DIAGNOSIS — Z09 Encounter for follow-up examination after completed treatment for conditions other than malignant neoplasm: Secondary | ICD-10-CM | POA: Diagnosis not present

## 2011-09-30 DIAGNOSIS — N61 Mastitis without abscess: Secondary | ICD-10-CM | POA: Diagnosis not present

## 2011-09-30 DIAGNOSIS — IMO0001 Reserved for inherently not codable concepts without codable children: Secondary | ICD-10-CM

## 2011-09-30 NOTE — ED Notes (Signed)
SILVADENE APPLIED WITH TELFA DRESSING COVERED WITH MEDIPORE TAPE

## 2011-09-30 NOTE — ED Notes (Signed)
PT HERE FOR FOLLOW UP WOUND RECHECK UNDER RIGHT BREAST.WOUND INTACT WITH BEEFY RED TISSUE AND NO DRAINAGE.DENIES FEVER OR PAIN

## 2011-09-30 NOTE — Discharge Instructions (Signed)
Continue your excellent care of the wound. We will be glad to see you as needed, but not necessary at this time with the healing doing so well, Return if any concerns.

## 2011-09-30 NOTE — ED Provider Notes (Signed)
History     CSN: [id number]  Arrival date & time 09/30/11  91   First MD Initiated Contact with Patient 09/30/11 1132      Chief Complaint  Patient presents with  . Wound Check    (Consider location/radiation/quality/duration/timing/severity/associated sxs/prior treatment) Patient is a 71 y.o. female presenting with wound check. The history is provided by the patient.  Wound Check  She was treated in the ED 5 to 10 days ago. Previous treatment in the ED includes wound cleansing or irrigation. Treatments since wound repair include regular soap and water washings and a wound recheck. There has been no drainage from the wound. There is no redness present. There is no swelling present. The pain has no pain.    Past Medical History  Diagnosis Date  . Diabetes mellitus   . Hypertension   . Thyroid disease   . Arthritis   . High cholesterol     Past Surgical History  Procedure Date  . Abdominal hysterectomy   . Ovary surgery   . Shoulder surgery     Family History  Problem Relation Age of Onset  . Diabetes Mother   . Hypertension Father   . Diabetes Sister   . Diabetes Brother   . Hypertension Brother     History  Substance Use Topics  . Smoking status: Never Smoker   . Smokeless tobacco: Not on file  . Alcohol Use: No    OB History    Grav Para Term Preterm Abortions TAB SAB Ect Mult Living                  Review of Systems  Constitutional: Negative.   Skin: Positive for wound.    Allergies  Codeine; Remicade; and Zestril  Home Medications   Current Outpatient Rx  Name Route Sig Dispense Refill  . ACETAMINOPHEN 500 MG PO TABS Oral Take 500 mg by mouth 2 (two) times daily.    . ATORVASTATIN CALCIUM 20 MG PO TABS Oral Take 20 mg by mouth daily.    . COLCHICINE 0.6 MG PO TABS Oral Take 0.6 mg by mouth daily.    [name] HYDROXYCHLOROQUINE SULFATE 200 MG PO TABS Oral Take 200 mg by mouth daily.    [name] LEVOTHYROXINE SODIUM 50 MCG PO TABS Oral Take 50 mcg by  mouth daily.    [name] METHOTREXATE PO Oral Take by mouth.    . METOPROLOL SUCCINATE ER 100 MG PO TB24 Oral Take 100 mg by mouth daily. Take with or immediately following a meal.    . OLMESARTAN MEDOXOMIL 40 MG PO TABS Oral Take 40 mg by mouth daily.    [name] PREDNISONE 5 MG PO TABS Oral Take 5 mg by mouth daily.    [name] PRESCRIPTION MEDICATION      . SILVER SULFADIAZINE 1 % EX CREA Topical Apply topically 2 (two) times daily. After washing previous cream off. 50 g 0  . SPIRONOLACTONE 50 MG PO TABS Oral Take 50 mg by mouth daily.    . TRAMADOL HCL ER 100 MG PO TB24 Oral Take 100 mg by mouth 2 (two) times daily.      BP 142/91  Pulse 96  Temp(Src) 98.2 F (36.8 C) (Oral)  Resp 18  SpO2 95%  Physical Exam  Nursing note and vitals reviewed. Constitutional: She appears well-developed and well-nourished.  Skin: Skin is warm and dry.       3cm circ clean wound has completely regranulated in , no drainage, nontender, clean  borders.    ED Course  Procedures (including critical care time)  Labs Reviewed - No data to display No results found.   1. Wound abscess, subsequent encounter       MDM          Billy Fischer, MD 09/30/11 1206

## 2011-10-08 DIAGNOSIS — Y849 Medical procedure, unspecified as the cause of abnormal reaction of the patient, or of later complication, without mention of misadventure at the time of the procedure: Secondary | ICD-10-CM | POA: Diagnosis not present

## 2011-10-08 DIAGNOSIS — N3946 Mixed incontinence: Secondary | ICD-10-CM | POA: Diagnosis not present

## 2011-10-08 DIAGNOSIS — E119 Type 2 diabetes mellitus without complications: Secondary | ICD-10-CM | POA: Diagnosis not present

## 2011-10-08 DIAGNOSIS — S21009A Unspecified open wound of unspecified breast, initial encounter: Secondary | ICD-10-CM | POA: Diagnosis not present

## 2011-10-10 DIAGNOSIS — N644 Mastodynia: Secondary | ICD-10-CM | POA: Diagnosis not present

## 2011-10-10 DIAGNOSIS — Y849 Medical procedure, unspecified as the cause of abnormal reaction of the patient, or of later complication, without mention of misadventure at the time of the procedure: Secondary | ICD-10-CM | POA: Diagnosis not present

## 2011-10-10 DIAGNOSIS — S21009A Unspecified open wound of unspecified breast, initial encounter: Secondary | ICD-10-CM | POA: Diagnosis not present

## 2011-10-17 ENCOUNTER — Other Ambulatory Visit: Payer: Self-pay | Admitting: Internal Medicine

## 2011-10-17 DIAGNOSIS — S21009A Unspecified open wound of unspecified breast, initial encounter: Secondary | ICD-10-CM

## 2011-10-17 DIAGNOSIS — Z79899 Other long term (current) drug therapy: Secondary | ICD-10-CM | POA: Diagnosis not present

## 2011-10-17 DIAGNOSIS — Y849 Medical procedure, unspecified as the cause of abnormal reaction of the patient, or of later complication, without mention of misadventure at the time of the procedure: Secondary | ICD-10-CM | POA: Diagnosis not present

## 2011-10-24 ENCOUNTER — Ambulatory Visit
Admission: RE | Admit: 2011-10-24 | Discharge: 2011-10-24 | Disposition: A | Discharge status: Home / Self Care | Payer: Medicare Other | Source: Ambulatory Visit | Attending: Internal Medicine | Admitting: Internal Medicine

## 2011-10-24 DIAGNOSIS — S21009A Unspecified open wound of unspecified breast, initial encounter: Secondary | ICD-10-CM

## 2011-10-24 DIAGNOSIS — N644 Mastodynia: Secondary | ICD-10-CM | POA: Diagnosis not present

## 2011-10-24 DIAGNOSIS — N61 Mastitis without abscess: Secondary | ICD-10-CM | POA: Diagnosis not present

## 2011-11-22 DIAGNOSIS — M1710 Unilateral primary osteoarthritis, unspecified knee: Secondary | ICD-10-CM | POA: Diagnosis not present

## 2011-11-22 DIAGNOSIS — M545 Low back pain, unspecified: Secondary | ICD-10-CM | POA: Diagnosis not present

## 2011-12-04 DIAGNOSIS — Z79899 Other long term (current) drug therapy: Secondary | ICD-10-CM | POA: Diagnosis not present

## 2011-12-04 DIAGNOSIS — M7989 Other specified soft tissue disorders: Secondary | ICD-10-CM | POA: Diagnosis not present

## 2011-12-04 DIAGNOSIS — M3500 Sicca syndrome, unspecified: Secondary | ICD-10-CM | POA: Diagnosis not present

## 2011-12-04 DIAGNOSIS — M069 Rheumatoid arthritis, unspecified: Secondary | ICD-10-CM | POA: Diagnosis not present

## 2011-12-05 DIAGNOSIS — R269 Unspecified abnormalities of gait and mobility: Secondary | ICD-10-CM | POA: Diagnosis not present

## 2011-12-05 DIAGNOSIS — M6281 Muscle weakness (generalized): Secondary | ICD-10-CM | POA: Diagnosis not present

## 2011-12-05 DIAGNOSIS — M545 Low back pain, unspecified: Secondary | ICD-10-CM | POA: Diagnosis not present

## 2011-12-05 DIAGNOSIS — IMO0002 Reserved for concepts with insufficient information to code with codable children: Secondary | ICD-10-CM | POA: Diagnosis not present

## 2011-12-09 DIAGNOSIS — M545 Low back pain, unspecified: Secondary | ICD-10-CM | POA: Diagnosis not present

## 2011-12-09 DIAGNOSIS — M6281 Muscle weakness (generalized): Secondary | ICD-10-CM | POA: Diagnosis not present

## 2011-12-09 DIAGNOSIS — M069 Rheumatoid arthritis, unspecified: Secondary | ICD-10-CM | POA: Diagnosis not present

## 2011-12-09 DIAGNOSIS — IMO0002 Reserved for concepts with insufficient information to code with codable children: Secondary | ICD-10-CM | POA: Diagnosis not present

## 2011-12-09 DIAGNOSIS — Z79899 Other long term (current) drug therapy: Secondary | ICD-10-CM | POA: Diagnosis not present

## 2011-12-09 DIAGNOSIS — R269 Unspecified abnormalities of gait and mobility: Secondary | ICD-10-CM | POA: Diagnosis not present

## 2011-12-10 DIAGNOSIS — M1710 Unilateral primary osteoarthritis, unspecified knee: Secondary | ICD-10-CM | POA: Diagnosis not present

## 2011-12-15 DIAGNOSIS — IMO0002 Reserved for concepts with insufficient information to code with codable children: Secondary | ICD-10-CM | POA: Diagnosis not present

## 2011-12-15 DIAGNOSIS — M6281 Muscle weakness (generalized): Secondary | ICD-10-CM | POA: Diagnosis not present

## 2011-12-15 DIAGNOSIS — M545 Low back pain, unspecified: Secondary | ICD-10-CM | POA: Diagnosis not present

## 2011-12-15 DIAGNOSIS — R269 Unspecified abnormalities of gait and mobility: Secondary | ICD-10-CM | POA: Diagnosis not present

## 2011-12-17 DIAGNOSIS — M545 Low back pain, unspecified: Secondary | ICD-10-CM | POA: Diagnosis not present

## 2011-12-17 DIAGNOSIS — M6281 Muscle weakness (generalized): Secondary | ICD-10-CM | POA: Diagnosis not present

## 2011-12-17 DIAGNOSIS — R269 Unspecified abnormalities of gait and mobility: Secondary | ICD-10-CM | POA: Diagnosis not present

## 2011-12-17 DIAGNOSIS — IMO0002 Reserved for concepts with insufficient information to code with codable children: Secondary | ICD-10-CM | POA: Diagnosis not present

## 2011-12-22 DIAGNOSIS — M545 Low back pain, unspecified: Secondary | ICD-10-CM | POA: Diagnosis not present

## 2011-12-22 DIAGNOSIS — IMO0002 Reserved for concepts with insufficient information to code with codable children: Secondary | ICD-10-CM | POA: Diagnosis not present

## 2011-12-22 DIAGNOSIS — R269 Unspecified abnormalities of gait and mobility: Secondary | ICD-10-CM | POA: Diagnosis not present

## 2011-12-22 DIAGNOSIS — M6281 Muscle weakness (generalized): Secondary | ICD-10-CM | POA: Diagnosis not present

## 2011-12-24 DIAGNOSIS — H43819 Vitreous degeneration, unspecified eye: Secondary | ICD-10-CM | POA: Diagnosis not present

## 2011-12-24 DIAGNOSIS — H25019 Cortical age-related cataract, unspecified eye: Secondary | ICD-10-CM | POA: Diagnosis not present

## 2011-12-24 DIAGNOSIS — Z79899 Other long term (current) drug therapy: Secondary | ICD-10-CM | POA: Diagnosis not present

## 2011-12-24 DIAGNOSIS — M069 Rheumatoid arthritis, unspecified: Secondary | ICD-10-CM | POA: Diagnosis not present

## 2011-12-24 DIAGNOSIS — E119 Type 2 diabetes mellitus without complications: Secondary | ICD-10-CM | POA: Diagnosis not present

## 2011-12-30 DIAGNOSIS — M545 Low back pain, unspecified: Secondary | ICD-10-CM | POA: Diagnosis not present

## 2011-12-30 DIAGNOSIS — R269 Unspecified abnormalities of gait and mobility: Secondary | ICD-10-CM | POA: Diagnosis not present

## 2011-12-30 DIAGNOSIS — IMO0002 Reserved for concepts with insufficient information to code with codable children: Secondary | ICD-10-CM | POA: Diagnosis not present

## 2011-12-30 DIAGNOSIS — M6281 Muscle weakness (generalized): Secondary | ICD-10-CM | POA: Diagnosis not present

## 2011-12-31 DIAGNOSIS — N2581 Secondary hyperparathyroidism of renal origin: Secondary | ICD-10-CM | POA: Diagnosis not present

## 2011-12-31 DIAGNOSIS — N183 Chronic kidney disease, stage 3 unspecified: Secondary | ICD-10-CM | POA: Diagnosis not present

## 2011-12-31 DIAGNOSIS — I129 Hypertensive chronic kidney disease with stage 1 through stage 4 chronic kidney disease, or unspecified chronic kidney disease: Secondary | ICD-10-CM | POA: Diagnosis not present

## 2011-12-31 DIAGNOSIS — E119 Type 2 diabetes mellitus without complications: Secondary | ICD-10-CM | POA: Diagnosis not present

## 2012-01-01 DIAGNOSIS — M545 Low back pain, unspecified: Secondary | ICD-10-CM | POA: Diagnosis not present

## 2012-01-01 DIAGNOSIS — R269 Unspecified abnormalities of gait and mobility: Secondary | ICD-10-CM | POA: Diagnosis not present

## 2012-01-01 DIAGNOSIS — M069 Rheumatoid arthritis, unspecified: Secondary | ICD-10-CM | POA: Diagnosis not present

## 2012-01-01 DIAGNOSIS — IMO0002 Reserved for concepts with insufficient information to code with codable children: Secondary | ICD-10-CM | POA: Diagnosis not present

## 2012-01-01 DIAGNOSIS — N183 Chronic kidney disease, stage 3 unspecified: Secondary | ICD-10-CM | POA: Diagnosis not present

## 2012-01-01 DIAGNOSIS — M6281 Muscle weakness (generalized): Secondary | ICD-10-CM | POA: Diagnosis not present

## 2012-01-05 DIAGNOSIS — IMO0002 Reserved for concepts with insufficient information to code with codable children: Secondary | ICD-10-CM | POA: Diagnosis not present

## 2012-01-05 DIAGNOSIS — M545 Low back pain, unspecified: Secondary | ICD-10-CM | POA: Diagnosis not present

## 2012-01-05 DIAGNOSIS — M6281 Muscle weakness (generalized): Secondary | ICD-10-CM | POA: Diagnosis not present

## 2012-01-05 DIAGNOSIS — R269 Unspecified abnormalities of gait and mobility: Secondary | ICD-10-CM | POA: Diagnosis not present

## 2012-01-06 DIAGNOSIS — Z79899 Other long term (current) drug therapy: Secondary | ICD-10-CM | POA: Diagnosis not present

## 2012-01-06 DIAGNOSIS — M069 Rheumatoid arthritis, unspecified: Secondary | ICD-10-CM | POA: Diagnosis not present

## 2012-01-16 DIAGNOSIS — M1710 Unilateral primary osteoarthritis, unspecified knee: Secondary | ICD-10-CM | POA: Diagnosis not present

## 2012-01-27 ENCOUNTER — Emergency Department (HOSPITAL_COMMUNITY): Payer: Medicare Other

## 2012-01-27 ENCOUNTER — Encounter (HOSPITAL_COMMUNITY): Payer: Self-pay

## 2012-01-27 DIAGNOSIS — R509 Fever, unspecified: Secondary | ICD-10-CM | POA: Diagnosis not present

## 2012-01-27 DIAGNOSIS — R079 Chest pain, unspecified: Secondary | ICD-10-CM | POA: Diagnosis present

## 2012-01-27 DIAGNOSIS — R220 Localized swelling, mass and lump, head: Secondary | ICD-10-CM | POA: Diagnosis not present

## 2012-01-27 DIAGNOSIS — R5381 Other malaise: Secondary | ICD-10-CM | POA: Diagnosis present

## 2012-01-27 DIAGNOSIS — Z23 Encounter for immunization: Secondary | ICD-10-CM

## 2012-01-27 DIAGNOSIS — Z6841 Body Mass Index (BMI) 40.0 and over, adult: Secondary | ICD-10-CM

## 2012-01-27 DIAGNOSIS — I10 Essential (primary) hypertension: Secondary | ICD-10-CM | POA: Diagnosis present

## 2012-01-27 DIAGNOSIS — R51 Headache: Secondary | ICD-10-CM | POA: Diagnosis not present

## 2012-01-27 DIAGNOSIS — M1990 Unspecified osteoarthritis, unspecified site: Secondary | ICD-10-CM | POA: Diagnosis present

## 2012-01-27 DIAGNOSIS — E039 Hypothyroidism, unspecified: Secondary | ICD-10-CM | POA: Diagnosis present

## 2012-01-27 DIAGNOSIS — E119 Type 2 diabetes mellitus without complications: Secondary | ICD-10-CM | POA: Diagnosis present

## 2012-01-27 DIAGNOSIS — G939 Disorder of brain, unspecified: Principal | ICD-10-CM | POA: Diagnosis present

## 2012-01-27 DIAGNOSIS — M2669 Other specified disorders of temporomandibular joint: Secondary | ICD-10-CM | POA: Diagnosis not present

## 2012-01-27 DIAGNOSIS — I214 Non-ST elevation (NSTEMI) myocardial infarction: Secondary | ICD-10-CM | POA: Diagnosis not present

## 2012-01-27 DIAGNOSIS — R1110 Vomiting, unspecified: Secondary | ICD-10-CM | POA: Diagnosis not present

## 2012-01-27 DIAGNOSIS — R0789 Other chest pain: Secondary | ICD-10-CM | POA: Diagnosis present

## 2012-01-27 DIAGNOSIS — G9389 Other specified disorders of brain: Secondary | ICD-10-CM | POA: Diagnosis not present

## 2012-01-27 DIAGNOSIS — M069 Rheumatoid arthritis, unspecified: Secondary | ICD-10-CM | POA: Diagnosis present

## 2012-01-27 LAB — COMPREHENSIVE METABOLIC PANEL WITH GFR
ALT: 24 U/L (ref 0–35)
AST: 16 U/L (ref 0–37)
Albumin: 3.8 g/dL (ref 3.5–5.2)
Alkaline Phosphatase: 60 U/L (ref 39–117)
BUN: 19 mg/dL (ref 6–23)
CO2: 32 meq/L (ref 19–32)
Calcium: 9.4 mg/dL (ref 8.4–10.5)
Chloride: 98 meq/L (ref 96–112)
Creatinine, Ser: 1.09 mg/dL (ref 0.50–1.10)
GFR calc Af Amer: 58 mL/min — ABNORMAL LOW
GFR calc non Af Amer: 50 mL/min — ABNORMAL LOW
Glucose, Bld: 121 mg/dL — ABNORMAL HIGH (ref 70–99)
Potassium: 3.9 meq/L (ref 3.5–5.1)
Sodium: 136 meq/L (ref 135–145)
Total Bilirubin: 1.1 mg/dL (ref 0.3–1.2)
Total Protein: 6.9 g/dL (ref 6.0–8.3)

## 2012-01-27 LAB — CBC WITH DIFFERENTIAL/PLATELET
Basophils Absolute: 0 10*3/uL (ref 0.0–0.1)
Basophils Relative: 0 % (ref 0–1)
HCT: 40.8 % (ref 36.0–46.0)
Hemoglobin: 13.2 g/dL (ref 12.0–15.0)
Lymphocytes Relative: 27 % (ref 12–46)
MCHC: 32.4 g/dL (ref 30.0–36.0)
Monocytes Absolute: 0.4 10*3/uL (ref 0.1–1.0)
Monocytes Relative: 7 % (ref 3–12)
Neutro Abs: 3.2 10*3/uL (ref 1.7–7.7)
Neutrophils Relative %: 60 % (ref 43–77)
RDW: 15.2 % (ref 11.5–15.5)
WBC: 5.3 10*3/uL (ref 4.0–10.5)

## 2012-01-27 LAB — POCT I-STAT TROPONIN I: Troponin i, poc: 0.05 ng/mL (ref 0.00–0.08)

## 2012-01-27 NOTE — ED Notes (Signed)
pt reports (L) eye pain radiating to (L) nose starting yesterday, pt reports mid-sternum chest pain radiating around to (L) side, and vomiting. Pt reports taking OTC sinus medicine w/no relief. Pt denies cough, chest congestion, fever, N/D, dizziness, sob, or diaphoresis. Nad, a/o x4, ekg completed in triage

## 2012-01-28 ENCOUNTER — Emergency Department (HOSPITAL_COMMUNITY): Payer: Medicare Other

## 2012-01-28 ENCOUNTER — Encounter (HOSPITAL_COMMUNITY): Payer: Self-pay | Admitting: Radiology

## 2012-01-28 ENCOUNTER — Inpatient Hospital Stay (HOSPITAL_COMMUNITY)
Admission: EM | Admit: 2012-01-28 | Discharge: 2012-01-30 | DRG: 071 | Disposition: A | Discharge status: Home / Self Care | Payer: Medicare Other | Attending: Internal Medicine | Admitting: Internal Medicine

## 2012-01-28 DIAGNOSIS — G9389 Other specified disorders of brain: Secondary | ICD-10-CM | POA: Diagnosis not present

## 2012-01-28 DIAGNOSIS — R221 Localized swelling, mass and lump, neck: Secondary | ICD-10-CM | POA: Diagnosis not present

## 2012-01-28 DIAGNOSIS — G939 Disorder of brain, unspecified: Secondary | ICD-10-CM | POA: Diagnosis not present

## 2012-01-28 DIAGNOSIS — Z6841 Body Mass Index (BMI) 40.0 and over, adult: Secondary | ICD-10-CM | POA: Diagnosis not present

## 2012-01-28 DIAGNOSIS — R079 Chest pain, unspecified: Secondary | ICD-10-CM

## 2012-01-28 DIAGNOSIS — Z23 Encounter for immunization: Secondary | ICD-10-CM | POA: Diagnosis not present

## 2012-01-28 DIAGNOSIS — I10 Essential (primary) hypertension: Secondary | ICD-10-CM

## 2012-01-28 DIAGNOSIS — M1711 Unilateral primary osteoarthritis, right knee: Secondary | ICD-10-CM | POA: Diagnosis present

## 2012-01-28 DIAGNOSIS — I214 Non-ST elevation (NSTEMI) myocardial infarction: Secondary | ICD-10-CM

## 2012-01-28 DIAGNOSIS — E079 Disorder of thyroid, unspecified: Secondary | ICD-10-CM

## 2012-01-28 DIAGNOSIS — R1110 Vomiting, unspecified: Secondary | ICD-10-CM | POA: Diagnosis not present

## 2012-01-28 DIAGNOSIS — R220 Localized swelling, mass and lump, head: Secondary | ICD-10-CM | POA: Diagnosis not present

## 2012-01-28 DIAGNOSIS — M1990 Unspecified osteoarthritis, unspecified site: Secondary | ICD-10-CM

## 2012-01-28 DIAGNOSIS — G500 Trigeminal neuralgia: Secondary | ICD-10-CM | POA: Diagnosis not present

## 2012-01-28 DIAGNOSIS — E7800 Pure hypercholesterolemia, unspecified: Secondary | ICD-10-CM

## 2012-01-28 DIAGNOSIS — E119 Type 2 diabetes mellitus without complications: Secondary | ICD-10-CM | POA: Diagnosis present

## 2012-01-28 DIAGNOSIS — E785 Hyperlipidemia, unspecified: Secondary | ICD-10-CM | POA: Diagnosis present

## 2012-01-28 DIAGNOSIS — R5381 Other malaise: Secondary | ICD-10-CM | POA: Diagnosis present

## 2012-01-28 DIAGNOSIS — M069 Rheumatoid arthritis, unspecified: Secondary | ICD-10-CM | POA: Diagnosis not present

## 2012-01-28 DIAGNOSIS — R51 Headache: Secondary | ICD-10-CM | POA: Diagnosis not present

## 2012-01-28 DIAGNOSIS — R509 Fever, unspecified: Secondary | ICD-10-CM | POA: Diagnosis not present

## 2012-01-28 DIAGNOSIS — E039 Hypothyroidism, unspecified: Secondary | ICD-10-CM | POA: Diagnosis present

## 2012-01-28 DIAGNOSIS — M2669 Other specified disorders of temporomandibular joint: Secondary | ICD-10-CM | POA: Diagnosis not present

## 2012-01-28 DIAGNOSIS — R072 Precordial pain: Secondary | ICD-10-CM | POA: Diagnosis not present

## 2012-01-28 DIAGNOSIS — R0789 Other chest pain: Secondary | ICD-10-CM | POA: Diagnosis present

## 2012-01-28 HISTORY — DX: Chest pain, unspecified: R07.9

## 2012-01-28 LAB — CK TOTAL AND CKMB (NOT AT ARMC)
CK, MB: 1.2 ng/mL (ref 0.3–4.0)
Relative Index: INVALID (ref 0.0–2.5)

## 2012-01-28 LAB — TROPONIN I
Troponin I: <0.3 ng/mL (ref <0.30)
Troponin I: <0.3 ng/mL (ref <0.30)

## 2012-01-28 LAB — POCT I-STAT TROPONIN I: Troponin i, poc: 0.15 ng/mL (ref 0.00–0.08)

## 2012-01-28 LAB — APTT: aPTT: 25 seconds (ref 24–37)

## 2012-01-28 MED ORDER — ACETAMINOPHEN 325 MG PO TABS: 650.0000 mg | ORAL_TABLET | Freq: Four times a day (QID) | ORAL | Status: DC | PRN

## 2012-01-28 MED ORDER — SODIUM CHLORIDE 0.9 % IV SOLN: INTRAVENOUS | Status: AC

## 2012-01-28 MED ORDER — METHOTREXATE 2.5 MG PO TABS
7.5000 mg | ORAL_TABLET | ORAL | Status: DC
  Administered 2012-01-29: 7.5 mg via ORAL
  Filled 2012-01-28: qty 3

## 2012-01-28 MED ORDER — HYDROXYCHLOROQUINE SULFATE 200 MG PO TABS
200.0000 mg | ORAL_TABLET | Freq: Two times a day (BID) | ORAL | Status: DC
Start: 2012-01-28 — End: 2012-01-30
  Administered 2012-01-28 – 2012-01-30 (×4): 200 mg via ORAL
  Filled 2012-01-28 (×5): qty 1

## 2012-01-28 MED ORDER — MORPHINE SULFATE 4 MG/ML IJ SOLN
2.0000 mg | INTRAMUSCULAR | Status: DC | PRN
  Administered 2012-01-28 – 2012-01-30 (×2): 2 mg via INTRAVENOUS
  Filled 2012-01-28 (×2): qty 1

## 2012-01-28 MED ORDER — MORPHINE SULFATE 4 MG/ML IJ SOLN
4.0000 mg | Freq: Once | INTRAMUSCULAR | Status: AC
  Administered 2012-01-28: 4 mg via INTRAVENOUS
  Filled 2012-01-28: qty 1

## 2012-01-28 MED ORDER — GADOBENATE DIMEGLUMINE 529 MG/ML IV SOLN
20.0000 mL | Freq: Once | INTRAVENOUS | Status: AC
  Administered 2012-01-28: 20 mL via INTRAVENOUS

## 2012-01-28 MED ORDER — DEXAMETHASONE SODIUM PHOSPHATE 10 MG/ML IJ SOLN
10.0000 mg | Freq: Four times a day (QID) | INTRAMUSCULAR | Status: AC
  Administered 2012-01-28 – 2012-01-29 (×4): 10 mg via INTRAVENOUS
  Filled 2012-01-28 (×4): qty 1

## 2012-01-28 MED ORDER — IOHEXOL 300 MG/ML  SOLN
80.0000 mL | Freq: Once | INTRAMUSCULAR | Status: AC | PRN
  Administered 2012-01-28: 80 mL via INTRAVENOUS

## 2012-01-28 MED ORDER — ASPIRIN 81 MG PO CHEW: 324.0000 mg | CHEWABLE_TABLET | Freq: Once | ORAL | Status: DC

## 2012-01-28 MED ORDER — HYDROMORPHONE HCL PF 1 MG/ML IJ SOLN
1.0000 mg | Freq: Once | INTRAMUSCULAR | Status: AC
  Administered 2012-01-28: 1 mg via INTRAVENOUS
  Filled 2012-01-28: qty 1

## 2012-01-28 MED ORDER — HYDROMORPHONE HCL 2 MG PO TABS
2.0000 mg | ORAL_TABLET | Freq: Once | ORAL | Status: AC
  Administered 2012-01-28: 2 mg via ORAL
  Filled 2012-01-28: qty 1

## 2012-01-28 MED ORDER — ASPIRIN 81 MG PO CHEW
CHEWABLE_TABLET | ORAL | Status: AC
  Filled 2012-01-28: qty 4

## 2012-01-28 MED ORDER — NITROGLYCERIN 2 % TD OINT: 1.0000 [in_us] | TOPICAL_OINTMENT | Freq: Once | TRANSDERMAL | Status: DC

## 2012-01-28 MED ORDER — SODIUM CHLORIDE 0.9 % IV SOLN
1000.0000 mL | Freq: Once | INTRAVENOUS | Status: AC
  Administered 2012-01-28: 1000 mL via INTRAVENOUS

## 2012-01-28 MED ORDER — ATORVASTATIN CALCIUM 20 MG PO TABS
20.0000 mg | ORAL_TABLET | Freq: Every day | ORAL | Status: DC
  Administered 2012-01-29 – 2012-01-30 (×2): 20 mg via ORAL
  Filled 2012-01-28 (×2): qty 1

## 2012-01-28 MED ORDER — ADULT MULTIVITAMIN W/MINERALS CH
1.0000 | ORAL_TABLET | Freq: Every day | ORAL | Status: DC
  Administered 2012-01-29 – 2012-01-30 (×2): 1 via ORAL
  Filled 2012-01-28 (×3): qty 1

## 2012-01-28 MED ORDER — HEPARIN (PORCINE) IN NACL 100-0.45 UNIT/ML-% IJ SOLN: 12.0000 [IU]/kg/h | INTRAMUSCULAR | Status: DC

## 2012-01-28 MED ORDER — SODIUM CHLORIDE 0.9 % IV SOLN
1000.0000 mL | INTRAVENOUS | Status: DC
  Administered 2012-01-28: 1000 mL via INTRAVENOUS

## 2012-01-28 MED ORDER — ACETAMINOPHEN 650 MG RE SUPP: 650.0000 mg | Freq: Four times a day (QID) | RECTAL | Status: DC | PRN

## 2012-01-28 MED ORDER — SPIRONOLACTONE 25 MG PO TABS
25.0000 mg | ORAL_TABLET | Freq: Every day | ORAL | Status: DC
  Administered 2012-01-29 – 2012-01-30 (×2): 25 mg via ORAL
  Filled 2012-01-28 (×2): qty 1

## 2012-01-28 MED ORDER — METHOTREXATE 2.5 MG PO TABS: 7.5000 mg | ORAL_TABLET | ORAL | Status: DC

## 2012-01-28 MED ORDER — ONDANSETRON HCL 4 MG/2ML IJ SOLN: 4.0000 mg | Freq: Four times a day (QID) | INTRAMUSCULAR | Status: DC | PRN

## 2012-01-28 MED ORDER — LEVOTHYROXINE SODIUM 50 MCG PO TABS
50.0000 ug | ORAL_TABLET | Freq: Every day | ORAL | Status: DC
  Administered 2012-01-29 – 2012-01-30 (×2): 50 ug via ORAL
  Filled 2012-01-28 (×2): qty 1

## 2012-01-28 MED ORDER — ONDANSETRON HCL 4 MG/2ML IJ SOLN
4.0000 mg | Freq: Once | INTRAMUSCULAR | Status: AC
  Administered 2012-01-28: 4 mg via INTRAVENOUS
  Filled 2012-01-28: qty 2

## 2012-01-28 MED ORDER — DOCUSATE SODIUM 100 MG PO CAPS
100.0000 mg | ORAL_CAPSULE | Freq: Two times a day (BID) | ORAL | Status: DC
  Administered 2012-01-28 – 2012-01-30 (×4): 100 mg via ORAL
  Filled 2012-01-28 (×5): qty 1

## 2012-01-28 MED ORDER — TRAMADOL HCL ER 100 MG PO TB24: 100.0000 mg | ORAL_TABLET | Freq: Two times a day (BID) | ORAL | Status: DC

## 2012-01-28 MED ORDER — METHOTREXATE (ANTI-RHEUMATIC) 2.5 MG PO TABS: 7.5000 mg | ORAL_TABLET | ORAL | Status: DC

## 2012-01-28 MED ORDER — NITROGLYCERIN IN D5W 200-5 MCG/ML-% IV SOLN
2.0000 ug/min | Freq: Once | INTRAVENOUS | Status: AC
  Administered 2012-01-28: 5 ug/min via INTRAVENOUS

## 2012-01-28 MED ORDER — ASPIRIN EC 81 MG PO TBEC
81.0000 mg | DELAYED_RELEASE_TABLET | Freq: Every day | ORAL | Status: DC
  Administered 2012-01-28 – 2012-01-30 (×3): 81 mg via ORAL
  Filled 2012-01-28 (×3): qty 1

## 2012-01-28 MED ORDER — ONDANSETRON HCL 4 MG PO TABS: 4.0000 mg | ORAL_TABLET | Freq: Four times a day (QID) | ORAL | Status: DC | PRN

## 2012-01-28 MED ORDER — ALBUTEROL SULFATE (5 MG/ML) 0.5% IN NEBU: 2.5000 mg | INHALATION_SOLUTION | RESPIRATORY_TRACT | Status: DC | PRN

## 2012-01-28 MED ORDER — METOPROLOL SUCCINATE ER 100 MG PO TB24
100.0000 mg | ORAL_TABLET | Freq: Every day | ORAL | Status: DC
  Administered 2012-01-29 – 2012-01-30 (×2): 100 mg via ORAL
  Filled 2012-01-28 (×2): qty 1

## 2012-01-28 MED ORDER — NITROGLYCERIN IN D5W 200-5 MCG/ML-% IV SOLN
INTRAVENOUS | Status: AC
  Filled 2012-01-28: qty 250

## 2012-01-28 MED ORDER — ASPIRIN 81 MG PO CHEW
324.0000 mg | CHEWABLE_TABLET | Freq: Once | ORAL | Status: AC
  Administered 2012-01-28: 324 mg via ORAL

## 2012-01-28 MED ORDER — INSULIN ASPART 100 UNIT/ML ~~LOC~~ SOLN
0.0000 [IU] | Freq: Three times a day (TID) | SUBCUTANEOUS | Status: DC
  Administered 2012-01-29: 2 [IU] via SUBCUTANEOUS

## 2012-01-28 MED ORDER — INFLUENZA VIRUS VACC SPLIT PF IM SUSP
0.5000 mL | INTRAMUSCULAR | Status: AC
  Administered 2012-01-29: 0.5 mL via INTRAMUSCULAR
  Filled 2012-01-28: qty 0.5

## 2012-01-28 MED ORDER — GABAPENTIN 300 MG PO CAPS
300.0000 mg | ORAL_CAPSULE | Freq: Two times a day (BID) | ORAL | Status: DC
  Administered 2012-01-28 – 2012-01-30 (×5): 300 mg via ORAL
  Filled 2012-01-28 (×6): qty 1

## 2012-01-28 MED ORDER — TRAMADOL HCL 50 MG PO TABS
50.0000 mg | ORAL_TABLET | Freq: Four times a day (QID) | ORAL | Status: DC
  Administered 2012-01-29 – 2012-01-30 (×7): 50 mg via ORAL
  Filled 2012-01-28 (×10): qty 1

## 2012-01-28 MED ORDER — IRBESARTAN 300 MG PO TABS
300.0000 mg | ORAL_TABLET | Freq: Every day | ORAL | Status: DC
  Administered 2012-01-29 – 2012-01-30 (×2): 300 mg via ORAL
  Filled 2012-01-28 (×2): qty 1

## 2012-01-28 NOTE — ED Notes (Signed)
Pt. 's information goes to pt.s daughter Shelly Coss, phone number [phone]

## 2012-01-28 NOTE — ED Notes (Signed)
Pt now reports some nausea

## 2012-01-28 NOTE — Consult Note (Signed)
Referring Physician: ER Reason for Consultation: CP + POC troponin     HPI:  [name] is a 71 y/o woman with ho/ obesity, HTN, HL, rheumatoid arthritis and DM2.   According to the records I have available she underwent a heart cath in 2003 which showed aneurysmal arteries but no obstructive CAD. This was done by Dr. Pernell Dupre. Family now asking to see Martin due to insurance reasons.  At baseline very debilitated due to RA and struggles with even the basic ADLs. However recently has been going to PT and able to walk without a cane. No CP or SOB/  She tells me she started to have a burning pain in the left side of her face yesterday. Pain radiated down into her neck, It got very severe. Then developed substernal CP. She denied any dyspnea, but did develop nausea and vomiting. Nothing made it better. She tried taking Claritin and Ultram with no relief. She thought it was a sinus infection, but she has not had any nasal drainage or fever. She came to ER. ECG was normal. First POC troponin was 0.05 and 2nd POC troponin came back 0.15 and patient thought to have NSTEMI. She was started on NTG with some improvement in her CP but not complete relief. However follow-up cardiac markers sent to the lab have been completely - X 2.  MRI head obtained and found to have cavernous sinus mass.  Review of Systems:     Cardiac Review of Systems: {Y] = yes _0  = no  Chest Pain [ y   ]  Resting SOB [   ] Exertional SOB  [  ]  Orthopnea [  ]   Pedal Edema [   ]    Palpitations [  ] Syncope  [  ]   Presyncope [   ]  General Review of Systems: [Y] = yes [  ]=no Constitional: recent weight change [  ]; anorexia [  ]; fatigue [ y]; nausea Blue.Reese  ]; night sweats [  ]; fever [  ]; or chills [  ];                                                                                                                                          Dental: poor dentition[  ];   Eye : blurred vision [  ]; diplopia [   ]; vision  changes [  ];  Amaurosis fugax[  ]; Resp: cough [  ];  wheezing[  ];  hemoptysis[  ]; shortness of breath[  ]; paroxysmal nocturnal dyspnea[  ]; dyspnea on exertion[  ]; or orthopnea[  ];  GI:  gallstones[  ], vomiting[y  ];  dysphagia[  ]; melena[  ];  hematochezia [  ]; heartburn[  ];    GU: kidney stones [  ]; hematuria[  ];   dysuria [  ];  nocturia[  ];  history of     obstruction [  ];                 Skin: rash, swelling[  ];, hair loss[  ];  peripheral edema[  ];  or itching[  ]; Musculosketetal: myalgias[  ];  joint swelling[y  ];  joint erythema[  ];  joint pain[  y];  back pain[  ];  Heme/Lymph: bruising[  ];  bleeding[  ];  anemia[  ];  Neuro: TIA[  ];  headaches[  ];  stroke[  ];  vertigo[  ];  seizures[  ];   paresthesias[  ];  difficulty walking[  ];  Psych:depression[  ]; anxiety[  ];  Endocrine: diabetes[y  ];  thyroid dysfunction[  ];  Other:  Past Medical History  Diagnosis Date  . Diabetes mellitus   . Hypertension   . Thyroid disease   . Arthritis   . High cholesterol        . sodium chloride  1,000 mL Intravenous Once  . aspirin  324 mg Oral Once  . dexamethasone  10 mg Intravenous Q6H  . gabapentin  300 mg Oral BID  . gadobenate dimeglumine  20 mL Intravenous Once  .  HYDROmorphone (DILAUDID) injection  1 mg Intravenous Once  . HYDROmorphone  2 mg Oral Once  .  morphine injection  4 mg Intravenous Once  .  morphine injection  4 mg Intravenous Once  . nitroGLYCERIN  2-200 mcg/min Intravenous Once  . ondansetron  4 mg Intravenous Once  . DISCONTD: aspirin  324 mg Oral Once  . DISCONTD: nitroGLYCERIN  1 inch Topical Once    Infusions:    . sodium chloride 1,000 mL (01/28/12 1050)  . DISCONTD: heparin      Allergies  Allergen Reactions  . Codeine Swelling    Facial swelling  . Remicade (Infliximab)     "sent me into shock"  . Zestril (Lisinopril) Swelling    Face and neck swelling    History   Social History  . Marital Status: Married     Spouse Name: N/A    Number of Children: N/A  . Years of Education: N/A   Occupational History  . Not on file.   Social History Main Topics  . Smoking status: Never Smoker   . Smokeless tobacco: Not on file  . Alcohol Use: No  . Drug Use: No  . Sexually Active:    Other Topics Concern  . Not on file   Social History Narrative  . No narrative on file    Family History  Problem Relation Age of Onset  . Diabetes Mother   . Hypertension Father   . Diabetes Sister   . Diabetes Brother   . Hypertension Brother   . Heart attack Mother   . Lung cancer Father     PHYSICAL EXAM: Filed Vitals:   01/28/12 1600  BP: 111/72  Pulse: 74  Temp:   Resp: 14    No intake or output data in the 24 hours ending 01/28/12 1726  General:  Elderly woman. Lying in bed No respiratory difficulty HEENT: normal Neck: supple. no JVD. Carotids 2+ bilat; no bruits. No lymphadenopathy or thryomegaly appreciated. Cor: PMI nondisplaced. Regular rate & rhythm. No rubs, gallops or murmurs. Chest wall tender to palpation.  Lungs: clear Abdomen: soft, obese, nontender, nondistended.  No bruits or masses. Good bowel sounds. Extremities: no cyanosis, clubbing, rash, edema Neuro: alert & oriented  x 3, cranial nerves grossly intact. moves all 4 extremities w/o difficulty. Affect pleasant.  ECG: NSR No ST-T wave abnormalities.    Results for orders placed during the hospital encounter of 01/28/12 (from the past 24 hour(s))  CBC WITH DIFFERENTIAL     Status: Abnormal   Collection Time   01/27/12 10:25 PM      Component Value Range   WBC 5.3  4.0 - 10.5 K/uL   RBC 4.36  3.87 - 5.11 MIL/uL   Hemoglobin 13.2  12.0 - 15.0 g/dL   HCT 40.8  36.0 - 46.0 %   MCV 93.6  78.0 - 100.0 fL   MCH 30.3  26.0 - 34.0 pg   MCHC 32.4  30.0 - 36.0 g/dL   RDW 15.2  11.5 - 15.5 %   Platelets 156  150 - 400 K/uL   Neutrophils Relative 60  43 - 77 %   Neutro Abs 3.2  1.7 - 7.7 K/uL   Lymphocytes Relative 27  12 - 46 %     Lymphs Abs 1.4  0.7 - 4.0 K/uL   Monocytes Relative 7  3 - 12 %   Monocytes Absolute 0.4  0.1 - 1.0 K/uL   Eosinophils Relative 7 (*) 0 - 5 %   Eosinophils Absolute 0.4  0.0 - 0.7 K/uL   Basophils Relative 0  0 - 1 %   Basophils Absolute 0.0  0.0 - 0.1 K/uL  COMPREHENSIVE METABOLIC PANEL     Status: Abnormal   Collection Time   01/27/12 10:25 PM      Component Value Range   Sodium 136  135 - 145 mEq/L   Potassium 3.9  3.5 - 5.1 mEq/L   Chloride 98  96 - 112 mEq/L   CO2 32  19 - 32 mEq/L   Glucose, Bld 121 (*) 70 - 99 mg/dL   BUN 19  6 - 23 mg/dL   Creatinine, Ser 1.09  0.50 - 1.10 mg/dL   Calcium 9.4  8.4 - 10.5 mg/dL   Total Protein 6.9  6.0 - 8.3 g/dL   Albumin 3.8  3.5 - 5.2 g/dL   AST 16  0 - 37 U/L   ALT 24  0 - 35 U/L   Alkaline Phosphatase 60  39 - 117 U/L   Total Bilirubin 1.1  0.3 - 1.2 mg/dL   GFR calc non Af Amer 50 (*) >90 mL/min   GFR calc Af Amer 58 (*) >90 mL/min  POCT I-STAT TROPONIN I     Status: Normal   Collection Time   01/27/12 11:41 PM      Component Value Range   Troponin i, poc 0.05  0.00 - 0.08 ng/mL   Comment 3           POCT I-STAT TROPONIN I     Status: Abnormal   Collection Time   01/28/12  9:37 AM      Component Value Range   Troponin i, poc 0.15 (*) 0.00 - 0.08 ng/mL   Comment NOTIFIED PHYSICIAN     Comment 3           TROPONIN I     Status: Normal   Collection Time   01/28/12 10:31 AM      Component Value Range   Troponin I <0.30  <0.30 ng/mL  PROTIME-INR     Status: Normal   Collection Time   01/28/12  2:54 PM      Component Value Range  Prothrombin Time 14.0  11.6 - 15.2 seconds   INR 1.06  0.00 - 1.49  APTT     Status: Normal   Collection Time   01/28/12  2:54 PM      Component Value Range   aPTT 25  24 - 37 seconds  SEDIMENTATION RATE     Status: Normal   Collection Time   01/28/12  2:54 PM      Component Value Range   Sed Rate 3  0 - 22 mm/hr  CK TOTAL AND CKMB     Status: Normal   Collection Time   01/28/12  4:11 PM       Component Value Range   Total CK 87  7 - 177 U/L   CK, MB 1.2  0.3 - 4.0 ng/mL   Relative Index RELATIVE INDEX IS INVALID  0.0 - 2.5  TROPONIN I     Status: Normal   Collection Time   01/28/12  4:11 PM      Component Value Range   Troponin I <0.30  <0.30 ng/mL   Dg Chest 2 View  01/27/2012  *RADIOLOGY REPORT*  Clinical Data: Chest pain.  Headache.  Vomiting.  CHEST - 2 VIEW  Comparison: 01/02/2010.  Findings: Shallow inspiration.  Mild cardiac enlargement with increased pulmonary vascularity suggesting mild vascular congestion.  Infiltration or atelectasis in the lung bases.  No blunting of costophrenic angles.  No pneumothorax.  Tortuous aorta. Degenerative changes in the spine.  IMPRESSION: Mild cardiac enlargement and pulmonary vascular congestion. Bilateral basilar atelectasis or infiltration.   Original Report Authenticated By: Neale Burly, M.D.    [facility] Wo Contrast  01/28/2012  *RADIOLOGY REPORT*  Clinical Data:  71 year old female with left side facial pain, headache.  Left cavernous sinus region mass on face CT.  MRI HEAD WITHOUT AND WITH CONTRAST  Technique: Multiplanar, multiecho pulse sequences of the brain and surrounding structures were obtained according to standard protocol without and with intravenous contrast.  Contrast: 38m MULTIHANCE GADOBENATE DIMEGLUMINE 529 MG/ML IV SOLN  Comparison: Face CT with contrast 01/28/2012.  Findings:  Normal cerebral volume.  No ventriculomegaly. No restricted diffusion to suggest acute infarction.  No acute intracranial hemorrhage identified.  No midline shift.  Pituitary appears within normal limits.  Negative cervicomedullary junction visualized cervical spine. Major intracranial vascular flow voids are preserved, and MRA findings are below.  Abnormality at the left cavernous sinus seen on the earlier comparison correspond to abnormal soft tissue infiltrating the left sinus and projecting slightly posteriorly off of the posterior  clinoid process.  The mass shows mildly abnormal diffusion (series 411), decreased T2 signal (series 10 images 16 and 17), isointense to cortex T1 signal, postcontrast enhancement with a central nonenhancing portion (series 11 image 18) and associated dural thickening with dural tail tracking into the left middle cranial fossa (image 17).  The mass encompasses 13 x 22 x 12 mm (transverse by AP by CC) not including all of the lateral extending dural tail.  The left fifth nerve cisternal segment appears mildly deviated laterally.  There is dural thickening surrounding the left trigeminal ganglion at [employer](series 12 image 16).  No abnormal enhancement of the ganglion itself, or the cisternal nerves.  There is cavernous sinus infiltration.  No thickening or enhancement of the left V2 segment.  No associated cerebral edema.  Hypoplastic left anterior clinoid process is noted, no definite orbital apex involvement by the mass.  Elsewhere, no other abnormal enhancement.  Scattered cerebral white matter and central pons T2 and FLAIR hyperintensity, nonspecific but most commonly related to small vessel disease.  Superimposed dilated perivascular spaces including in the deep gray matter nuclei.  No cortical encephalomalacia.  Negative cerebellum.  Orbit soft tissues are within normal limits. Visualized paranasal sinuses and mastoids are clear.  Negative scalp soft tissues.  IMPRESSION:  1.  Infiltrative lesion of the left cavernous sinus with posterior extension off the posterior clinoid process as seen on the comparison CT.  Associated dural thickening and dural tail extending in the left middle cranial fossa. 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 Malignant tumor such as lymphoma is considered but felt less likely. 2.   Left cavernous sinus infiltration by the mass and dural thickening surrounding the left gasserian  ganglion.  No definite left orbital apex involvement. 3.  No other acute intracranial abnormality.  Nonspecific signal changes in the brain, favor due to chronic small vessel disease. 4.  MRA findings are below.  MRA HEAD WITHOUT CONTRAST  Technique: Angiographic images of the Circle of Willis were obtained using MRA technique without  intravenous contrast.  Findings:  Artifact at the cervicomedullary junction. Antegrade flow in the posterior circulation with codominant distal vertebral arteries.  The no distal vertebral stenosis.  Normal vertebrobasilar junction.  No basilar stenosis.  Mild basilar tortuosity.  AICA vessel origins are patent.  SCA and PCA origins are patent.  Fetal type left PCA.  Bilateral PCA branches are within normal limits.  Left posterior communicating artery diminutive or absent.  Antegrade flow in both ICA siphons.  No ICA stenosis.  In the left cavernous ICA and left ICA terminus there is no flow signal in the region of the soft tissue mass (series 9 image 67).  Normal bilateral MCA origins.  Normal left ACA origin.  Hypoplastic right ACA A1 segment with an infundibulum at its origin.  Anterior communicating artery within normal limits.  Visualized ACA branches within normal limits.  Visualized bilateral MCA branches within normal limits.  IMPRESSION: 1.  No flow signal in the region of the left cavernous sinus mass, in keeping with the favored diagnosis of meningioma. 2.  Essentially negative intracranial MRA; hypoplastic right ACA A1 segment with an infundibulum at its origin.   Original Report Authenticated By: Randall An, M.D.    [name] Wo Contrast  01/28/2012  *RADIOLOGY REPORT*  Clinical Data:  71 year old female with left side facial pain, headache.  Left cavernous sinus region mass on face CT.  MRI HEAD WITHOUT AND WITH CONTRAST  Technique: Multiplanar, multiecho pulse sequences of the brain and surrounding structures were obtained according to standard protocol without  and with intravenous contrast.  Contrast: 35m MULTIHANCE GADOBENATE DIMEGLUMINE 529 MG/ML IV SOLN  Comparison: Face CT with contrast 01/28/2012.  Findings:  Normal cerebral volume.  No ventriculomegaly. No restricted diffusion to suggest acute infarction.  No acute intracranial hemorrhage identified.  No midline shift.  Pituitary appears within normal limits.  Negative cervicomedullary junction visualized cervical spine. Major intracranial vascular flow voids are preserved, and MRA findings are below.  Abnormality at the left cavernous sinus seen on the earlier comparison correspond to abnormal soft tissue infiltrating the left sinus and projecting slightly posteriorly off of the posterior clinoid process.  The mass shows mildly abnormal diffusion (series 411), decreased T2 signal (series 10 images 16 and 17), isointense to cortex T1 signal, postcontrast  enhancement with a central nonenhancing portion (series 11 image 18) and associated dural thickening with dural tail tracking into the left middle cranial fossa (image 17).  The mass encompasses 13 x 22 x 12 mm (transverse by AP by CC) not including all of the lateral extending dural tail.  The left fifth nerve cisternal segment appears mildly deviated laterally.  There is dural thickening surrounding the left trigeminal ganglion at [employer] (series 12 image 16).  No abnormal enhancement of the ganglion itself, or the cisternal nerves.  There is cavernous sinus infiltration.  No thickening or enhancement of the left V2 segment.  No associated cerebral edema.  Hypoplastic left anterior clinoid process is noted, no definite orbital apex involvement by the mass.  Elsewhere, no other abnormal enhancement.  Scattered cerebral white matter and central pons T2 and FLAIR hyperintensity, nonspecific but most commonly related to small vessel disease.  Superimposed dilated perivascular spaces including in the deep gray matter nuclei.  No cortical encephalomalacia.   Negative cerebellum.  Orbit soft tissues are within normal limits. Visualized paranasal sinuses and mastoids are clear.  Negative scalp soft tissues.  IMPRESSION:  1.  Infiltrative lesion of the left cavernous sinus with posterior extension off the posterior clinoid process as seen on the comparison CT.  Associated dural thickening and dural tail extending in the left middle cranial fossa. 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 Malignant tumor such as lymphoma is considered but felt less likely. 2.   Left cavernous sinus infiltration by the mass and dural thickening surrounding the left gasserian ganglion.  No definite left orbital apex involvement. 3.  No other acute intracranial abnormality.  Nonspecific signal changes in the brain, favor due to chronic small vessel disease. 4.  MRA findings are below.  MRA HEAD WITHOUT CONTRAST  Technique: Angiographic images of the Circle of Willis were obtained using MRA technique without  intravenous contrast.  Findings:  Artifact at the cervicomedullary junction. Antegrade flow in the posterior circulation with codominant distal vertebral arteries.  The no distal vertebral stenosis.  Normal vertebrobasilar junction.  No basilar stenosis.  Mild basilar tortuosity.  AICA vessel origins are patent.  SCA and PCA origins are patent.  Fetal type left PCA.  Bilateral PCA branches are within normal limits.  Left posterior communicating artery diminutive or absent.  Antegrade flow in both ICA siphons.  No ICA stenosis.  In the left cavernous ICA and left ICA terminus there is no flow signal in the region of the soft tissue mass (series 9 image 67).  Normal bilateral MCA origins.  Normal left ACA origin.  Hypoplastic right ACA A1 segment with an infundibulum at its origin.  Anterior communicating artery within normal limits.  Visualized ACA branches within normal limits.  Visualized  bilateral MCA branches within normal limits.  IMPRESSION: 1.  No flow signal in the region of the left cavernous sinus mass, in keeping with the favored diagnosis of meningioma. 2.  Essentially negative intracranial MRA; hypoplastic right ACA A1 segment with an infundibulum at its origin.   Original Report Authenticated By: Randall An, M.D.    Ct Maxillofacial W/cm  01/28/2012  *RADIOLOGY REPORT*  Clinical Data: Left-sided facial pain and fever.  Headache.  CT MAXILLOFACIAL WITH CONTRAST  Technique:  Multidetector CT imaging of the maxillofacial structures was performed with intravenous contrast. Multiplanar CT image reconstructions were also generated.  Contrast: 49m OMNIPAQUE IOHEXOL  300 MG/ML  SOLN  Comparison: Cervical spine radiographs 02/25/2007.  Findings: Visualization of some portions of the scan limited due to streak artifact arising from dental hardware.  Minimal infiltration in the subcutaneous fat over the left mandibular region is demonstrated.  No discrete fluid collection or contrast enhancement to suggest abscess.  Cervical lymph nodes are not pathologically enlarged.  Salivary glands appear symmetrical. No mucosal or prevertebral mass lesions appreciated.  Visualized jugular and carotid vessels appear patent.  No displacement of vessels or fat planes.  Visualized paranasal sinuses are not opacified.  Degenerative changes in the upper cervical spine. Degenerative changes in the temporomandibular joints bilaterally with joint space narrowing, deformity of the mandibular heads, and subcortical cystic changes.  There is an expansile lesion with central hypo density laterally to the left of the pituitary fossa.  This could represent a mass lesion or aneurysm arising in the region of Meckel's cave.  The lesion measures about 11 x 12 x 7 mm.  MRI correlation is recommended.  IMPRESSION: Mass lesion or possibly aneurysm arising to the left of the pituitary fossa.  MRI recommended for further  evaluation.  Minimal soft tissue infiltration on the left without evidence of abscess. Degenerative changes in the cervical spine and temporomandibular joints.   Original Report Authenticated By: Neale Burly, M.D.      ASSESSMENT: 1. Chest pain       --positive POC troponin. Normal ECG and f/u troponin.       --normal coronaries by cath 2003 2. Cavernous sinus mass 3. Rheumatoid arthritis 4. DM2 5. HTN 6. HL  PLAN/DISCUSSION:  Her CP has typical and atypical features. My sense is this is non-cardiac with spurious POC markers. However, given her RFs I feel she needs further testing to exclude significant underlying CAD. We discussed cath vs stress testing and we will proceed with Lexiscan myoview in am. Can stop IV NTG (use topical if needed) and would not start heparin unless there is clear indication of ischemia. Ok to continue ASA. We will continue to follow. D/w Dr. [place] Pink of [facility] who is admitting.  Benay Spice 5:56 PM

## 2012-01-28 NOTE — ED Provider Notes (Signed)
3:39 PM Sign out received from [facility], PA-C, at change of shift.  Pt with NStemi and cavernous sinus mass.  To be seen by [facility] cardiology and neurology.  To be admitted by cardiology.  Vernie Shanks has spoken with neurology who will also see the patient in consult.  Pt has been seen by Dr Saintclair Halsted, neurosurgery, who states this is a nonoperative lesion.  Heparin pending neurology consult as neurosurgery raised the question of whether LP was necessary.  Dr Saintclair Halsted recommended discussing this with them prior to starting heparin.    4:12 PM Sign out also received from Dr Wyvonnia Dusky at change of shift.  Pt to be admitted to medicine.  Pt has been seen in the past by [facility] cardiology, Daneen Schick.  However, per Margarita Mail, patient specifically requested to have [place] see her.    4:56 PM I have spoken with Patria Mane, PA-C, who will admit the patient.  She agrees with holding heparin until cardiology sees and evaluates the patient. Per nurse Cecille Rubin, patient is chest pain free at this time. Pt to be admitted to step down bed, team 10.  I have placed holding orders for patient.    Dr Tempie Hoist has also seen the patient, agrees with holding heparin.    Results for orders placed during the hospital encounter of 01/28/12  CBC WITH DIFFERENTIAL      Component Value Range   WBC 5.3  4.0 - 10.5 K/uL   RBC 4.36  3.87 - 5.11 MIL/uL   Hemoglobin 13.2  12.0 - 15.0 g/dL   HCT 40.8  36.0 - 46.0 %   MCV 93.6  78.0 - 100.0 fL   MCH 30.3  26.0 - 34.0 pg   MCHC 32.4  30.0 - 36.0 g/dL   RDW 15.2  11.5 - 15.5 %   Platelets 156  150 - 400 K/uL   Neutrophils Relative 60  43 - 77 %   Neutro Abs 3.2  1.7 - 7.7 K/uL   Lymphocytes Relative 27  12 - 46 %   Lymphs Abs 1.4  0.7 - 4.0 K/uL   Monocytes Relative 7  3 - 12 %   Monocytes Absolute 0.4  0.1 - 1.0 K/uL   Eosinophils Relative 7 (*) 0 - 5 %   Eosinophils Absolute 0.4  0.0 - 0.7 K/uL   Basophils Relative 0  0 - 1 %   Basophils Absolute 0.0  0.0 - 0.1 K/uL    COMPREHENSIVE METABOLIC PANEL      Component Value Range   Sodium 136  135 - 145 mEq/L   Potassium 3.9  3.5 - 5.1 mEq/L   Chloride 98  96 - 112 mEq/L   CO2 32  19 - 32 mEq/L   Glucose, Bld 121 (*) 70 - 99 mg/dL   BUN 19  6 - 23 mg/dL   Creatinine, Ser 1.09  0.50 - 1.10 mg/dL   Calcium 9.4  8.4 - 10.5 mg/dL   Total Protein 6.9  6.0 - 8.3 g/dL   Albumin 3.8  3.5 - 5.2 g/dL   AST 16  0 - 37 U/L   ALT 24  0 - 35 U/L   Alkaline Phosphatase 60  39 - 117 U/L   Total Bilirubin 1.1  0.3 - 1.2 mg/dL   GFR calc non Af Amer 50 (*) >90 mL/min   GFR calc Af Amer 58 (*) >90 mL/min  POCT I-STAT TROPONIN I      Component Value Range  Troponin i, poc 0.05  0.00 - 0.08 ng/mL   Comment 3           POCT I-STAT TROPONIN I      Component Value Range   Troponin i, poc 0.15 (*) 0.00 - 0.08 ng/mL   Comment NOTIFIED PHYSICIAN     Comment 3           TROPONIN I      Component Value Range   Troponin I <0.30  <0.30 ng/mL  PROTIME-INR      Component Value Range   Prothrombin Time 14.0  11.6 - 15.2 seconds   INR 1.06  0.00 - 1.49  APTT      Component Value Range   aPTT 25  24 - 37 seconds  SEDIMENTATION RATE      Component Value Range   Sed Rate 3  0 - 22 mm/hr  CK TOTAL AND CKMB      Component Value Range   Total CK 87  7 - 177 U/L   CK, MB 1.2  0.3 - 4.0 ng/mL   Relative Index RELATIVE INDEX IS INVALID  0.0 - 2.5  TROPONIN I      Component Value Range   Troponin I <0.30  <0.30 ng/mL   Dg Chest 2 View  01/27/2012  *RADIOLOGY REPORT*  Clinical Data: Chest pain.  Headache.  Vomiting.  CHEST - 2 VIEW  Comparison: 01/02/2010.  Findings: Shallow inspiration.  Mild cardiac enlargement with increased pulmonary vascularity suggesting mild vascular congestion.  Infiltration or atelectasis in the lung bases.  No blunting of costophrenic angles.  No pneumothorax.  Tortuous aorta. Degenerative changes in the spine.  IMPRESSION: Mild cardiac enlargement and pulmonary vascular congestion. Bilateral basilar  atelectasis or infiltration.   Original Report Authenticated By: Neale Burly, M.D.    Mr  [facility] Wo Contrast  01/28/2012  *RADIOLOGY REPORT*  Clinical Data:  71 year old female with left side facial pain, headache.  Left cavernous sinus region mass on face CT.  MRI HEAD WITHOUT AND WITH CONTRAST  Technique: Multiplanar, multiecho pulse sequences of the brain and surrounding structures were obtained according to standard protocol without and with intravenous contrast.  Contrast: 50m MULTIHANCE GADOBENATE DIMEGLUMINE 529 MG/ML IV SOLN  Comparison: Face CT with contrast 01/28/2012.  Findings:  Normal cerebral volume.  No ventriculomegaly. No restricted diffusion to suggest acute infarction.  No acute intracranial hemorrhage identified.  No midline shift.  Pituitary appears within normal limits.  Negative cervicomedullary junction visualized cervical spine. Major intracranial vascular flow voids are preserved, and MRA findings are below.  Abnormality at the left cavernous sinus seen on the earlier comparison correspond to abnormal soft tissue infiltrating the left sinus and projecting slightly posteriorly off of the posterior clinoid process.  The mass shows mildly abnormal diffusion (series 411), decreased T2 signal (series 10 images 16 and 17), isointense to cortex T1 signal, postcontrast enhancement with a central nonenhancing portion (series 11 image 18) and associated dural thickening with dural tail tracking into the left middle cranial fossa (image 17).  The mass encompasses 13 x 22 x 12 mm (transverse by AP by CC) not including all of the lateral extending dural tail.  The left fifth nerve cisternal segment appears mildly deviated laterally.  There is dural thickening surrounding the left trigeminal ganglion at [employer]ant(series 12 image 16).  No abnormal enhancement of the ganglion itself, or the cisternal nerves.  There is cavernous sinus infiltration.  No thickening or enhancement of  the left  V2 segment.  No associated cerebral edema.  Hypoplastic left anterior clinoid process is noted, no definite orbital apex involvement by the mass.  Elsewhere, no other abnormal enhancement.  Scattered cerebral white matter and central pons T2 and FLAIR hyperintensity, nonspecific but most commonly related to small vessel disease.  Superimposed dilated perivascular spaces including in the deep gray matter nuclei.  No cortical encephalomalacia.  Negative cerebellum.  Orbit soft tissues are within normal limits. Visualized paranasal sinuses and mastoids are clear.  Negative scalp soft tissues.  IMPRESSION:  1.  Infiltrative lesion of the left cavernous sinus with posterior extension off the posterior clinoid process as seen on the comparison CT.  Associated dural thickening and dural tail extending in the left middle cranial fossa. 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 Malignant tumor such as lymphoma is considered but felt less likely. 2.   Left cavernous sinus infiltration by the mass and dural thickening surrounding the left gasserian ganglion.  No definite left orbital apex involvement. 3.  No other acute intracranial abnormality.  Nonspecific signal changes in the brain, favor due to chronic small vessel disease. 4.  MRA findings are below.  MRA HEAD WITHOUT CONTRAST  Technique: Angiographic images of the Circle of Willis were obtained using MRA technique without  intravenous contrast.  Findings:  Artifact at the cervicomedullary junction. Antegrade flow in the posterior circulation with codominant distal vertebral arteries.  The no distal vertebral stenosis.  Normal vertebrobasilar junction.  No basilar stenosis.  Mild basilar tortuosity.  AICA vessel origins are patent.  SCA and PCA origins are patent.  Fetal type left PCA.  Bilateral PCA branches are within normal limits.  Left posterior communicating artery  diminutive or absent.  Antegrade flow in both ICA siphons.  No ICA stenosis.  In the left cavernous ICA and left ICA terminus there is no flow signal in the region of the soft tissue mass (series 9 image 67).  Normal bilateral MCA origins.  Normal left ACA origin.  Hypoplastic right ACA A1 segment with an infundibulum at its origin.  Anterior communicating artery within normal limits.  Visualized ACA branches within normal limits.  Visualized bilateral MCA branches within normal limits.  IMPRESSION: 1.  No flow signal in the region of the left cavernous sinus mass, in keeping with the favored diagnosis of meningioma. 2.  Essentially negative intracranial MRA; hypoplastic right ACA A1 segment with an infundibulum at its origin.   Original Report Authenticated By: Randall An, M.D.    Mr [name] Wo Contrast  01/28/2012  *RADIOLOGY REPORT*  Clinical Data:  71 year old female with left side facial pain, headache.  Left cavernous sinus region mass on face CT.  MRI HEAD WITHOUT AND WITH CONTRAST  Technique: Multiplanar, multiecho pulse sequences of the brain and surrounding structures were obtained according to standard protocol without and with intravenous contrast.  Contrast: 49m MULTIHANCE GADOBENATE DIMEGLUMINE 529 MG/ML IV SOLN  Comparison: Face CT with contrast 01/28/2012.  Findings:  Normal cerebral volume.  No ventriculomegaly. No restricted diffusion to suggest acute infarction.  No acute intracranial hemorrhage identified.  No midline shift.  Pituitary appears within normal limits.  Negative cervicomedullary junction visualized cervical spine. Major intracranial vascular flow voids are preserved, and MRA findings are below.  Abnormality at the left cavernous sinus seen on the earlier comparison correspond to abnormal soft tissue infiltrating the left sinus and projecting  slightly posteriorly off of the posterior clinoid process.  The mass shows mildly abnormal diffusion (series 411), decreased T2 signal  (series 10 images 16 and 17), isointense to cortex T1 signal, postcontrast enhancement with a central nonenhancing portion (series 11 image 18) and associated dural thickening with dural tail tracking into the left middle cranial fossa (image 17).  The mass encompasses 13 x 22 x 12 mm (transverse by AP by CC) not including all of the lateral extending dural tail.  The left fifth nerve cisternal segment appears mildly deviated laterally.  There is dural thickening surrounding the left trigeminal ganglion at [employer] (series 12 image 16).  No abnormal enhancement of the ganglion itself, or the cisternal nerves.  There is cavernous sinus infiltration.  No thickening or enhancement of the left V2 segment.  No associated cerebral edema.  Hypoplastic left anterior clinoid process is noted, no definite orbital apex involvement by the mass.  Elsewhere, no other abnormal enhancement.  Scattered cerebral white matter and central pons T2 and FLAIR hyperintensity, nonspecific but most commonly related to small vessel disease.  Superimposed dilated perivascular spaces including in the deep gray matter nuclei.  No cortical encephalomalacia.  Negative cerebellum.  Orbit soft tissues are within normal limits. Visualized paranasal sinuses and mastoids are clear.  Negative scalp soft tissues.  IMPRESSION:  1.  Infiltrative lesion of the left cavernous sinus with posterior extension off the posterior clinoid process as seen on the comparison CT.  Associated dural thickening and dural tail extending in the left middle cranial fossa. 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 Malignant tumor such as lymphoma is considered but felt less likely. 2.   Left cavernous sinus infiltration by the mass and dural thickening surrounding the left gasserian ganglion.  No definite left orbital apex involvement. 3.  No other acute intracranial  abnormality.  Nonspecific signal changes in the brain, favor due to chronic small vessel disease. 4.  MRA findings are below.  MRA HEAD WITHOUT CONTRAST  Technique: Angiographic images of the Circle of Willis were obtained using MRA technique without  intravenous contrast.  Findings:  Artifact at the cervicomedullary junction. Antegrade flow in the posterior circulation with codominant distal vertebral arteries.  The no distal vertebral stenosis.  Normal vertebrobasilar junction.  No basilar stenosis.  Mild basilar tortuosity.  AICA vessel origins are patent.  SCA and PCA origins are patent.  Fetal type left PCA.  Bilateral PCA branches are within normal limits.  Left posterior communicating artery diminutive or absent.  Antegrade flow in both ICA siphons.  No ICA stenosis.  In the left cavernous ICA and left ICA terminus there is no flow signal in the region of the soft tissue mass (series 9 image 67).  Normal bilateral MCA origins.  Normal left ACA origin.  Hypoplastic right ACA A1 segment with an infundibulum at its origin.  Anterior communicating artery within normal limits.  Visualized ACA branches within normal limits.  Visualized bilateral MCA branches within normal limits.  IMPRESSION: 1.  No flow signal in the region of the left cavernous sinus mass, in keeping with the favored diagnosis of meningioma. 2.  Essentially negative intracranial MRA; hypoplastic right ACA A1 segment with an infundibulum at its origin.   Original Report Authenticated By: Randall An, M.D.    Ct Maxillofacial W/cm  01/28/2012  *RADIOLOGY REPORT*  Clinical Data: Left-sided facial pain and fever.  Headache.  CT MAXILLOFACIAL WITH CONTRAST  Technique:  Multidetector CT imaging of the maxillofacial structures was performed with intravenous contrast. Multiplanar CT image reconstructions were also generated.  Contrast: 69m OMNIPAQUE IOHEXOL 300 MG/ML  SOLN  Comparison: Cervical spine radiographs 02/25/2007.  Findings:  Visualization of some portions of the scan limited due to streak artifact arising from dental hardware.  Minimal infiltration in the subcutaneous fat over the left mandibular region is demonstrated.  No discrete fluid collection or contrast enhancement to suggest abscess.  Cervical lymph nodes are not pathologically enlarged.  Salivary glands appear symmetrical. No mucosal or prevertebral mass lesions appreciated.  Visualized jugular and carotid vessels appear patent.  No displacement of vessels or fat planes.  Visualized paranasal sinuses are not opacified.  Degenerative changes in the upper cervical spine. Degenerative changes in the temporomandibular joints bilaterally with joint space narrowing, deformity of the mandibular heads, and subcortical cystic changes.  There is an expansile lesion with central hypo density laterally to the left of the pituitary fossa.  This could represent a mass lesion or aneurysm arising in the region of Meckel's cave.  The lesion measures about 11 x 12 x 7 mm.  MRI correlation is recommended.  IMPRESSION: Mass lesion or possibly aneurysm arising to the left of the pituitary fossa.  MRI recommended for further evaluation.  Minimal soft tissue infiltration on the left without evidence of abscess. Degenerative changes in the cervical spine and temporomandibular joints.   Original Report Authenticated By: WNeale Burly M.D.       EEagle PUtah09/11/13 2000

## 2012-01-28 NOTE — ED Provider Notes (Addendum)
Date: 01/28/2012  Rate: 64  Rhythm: normal sinus rhythm  QRS Axis: normal  Intervals: normal  ST/T Wave abnormalities: normal  Conduction Disutrbances:none  Narrative Interpretation:   Old EKG Reviewed: unchanged  MRI findings d/w Dr. Saintclair Halsted.  He will evaluate patient but would like cardiology/medical admission.  He states with no hemorrhage on CT or MRI, anticoagulation is safe.   Ezequiel Essex, MD 01/28/12 [place], MD 01/28/12 1053

## 2012-01-28 NOTE — ED Provider Notes (Signed)
10:20 AM BP 138/84  Pulse 72  Temp 98.1 F (36.7 C) (Oral)  Resp 14  SpO2 98% . Care of patient in CDU. She presented initially with complaint of chest pain, nausea, vomiting, left-sided facial burning and left eye pain. Initial EKG and troponin were negative. MR shows lesion of the left cavernous sinus. Not definitively rule out cancer. Repeat troponin is elevated. Patient is diagnosed with an STEMI. I have started her on nitro drip and given her 325, aspirin. She is on fluid and telemetry with Dr. Carleene Overlie or and we are not going to heparinize the patient. At this time. Due to possible cancerous mass in the head, which may increase risk of bleeding in the brain. Placed a call for consult to cardiology for admission of an NSTEMI. Dr. Wyvonnia Dusky, 4 we'll consult with neurosurgery.   11:17 AM I spoke with the patient. He says that she still has chest pain. She rates her pain currently at a 6/10. With the patient for some time about her history. She has had 8 years worth of soaking night sweats. She's been evaluated for menopause and other symptoms. She's also has been extremely fatigued, including decrease in ability to maintain her level of ADLs. She has a long-standing history of rheumatoid arthritis for which she takes methotrexate, Plaquenil and tocilizumab. Denies easy bruising, bruising or bleeding. She denies any swelling in the groin, axilla or neck. Tubing patient 4 mg of morphine to control plane pain and Zofran for prophylaxis against nausea. Admitting provider for cardiology, is aware of the patient and has agreed to come and consult for admission.  3:00 PM Neurosurgery Dr. Weston Settle has seen Patient and wants Neurology to get involved with diagnosis. R/O cavernous sinus syndrome vs. Trigeminal neuralgia, states that Neuro may want LP done and afterward we may heparinize patient for her NSTEMI.    3:15  Spoke with neurology who has agreed to see patient in CDU.  I have given report to [facility],  PA-C sho has agreed to assume care of patient.  Margarita Mail, PA-C 01/31/12 1851

## 2012-01-28 NOTE — Consult Note (Signed)
Reason for Consult: Face pain possible trigeminal neuralgia with a mass lesion in her left cavernous sinus Referring Physician: Emergency room   [name] is an 71 y.o. female.  HPI: Patient is a 71 year old female who started experiencing face pain on Sunday and Monday and I would base to begin behind her left eye with involvement her left eye her left cheek in her left jaw she denies noticing any weakness any difficulty with eye movements and he denies any nausea vomiting denies any numbness tingling arms or her legs. It is a constant nonprogressive but severe for the last day or 2 she has good relief from the pain medication that she's been given in the emergency department the. Her past history is fairly unremarkable she is history rheumatoid arthritis on chronic prednisone she is to have a history of multiple pneumonias most recent of which was in May. She can immerse her today she's been worked up is possible as she is having an MI in addition to the CT MRI she had a revealed a lesion in her left cavernous sinus involving the upper cranial nerves.  Past Medical History  Diagnosis Date  . Diabetes mellitus   . Hypertension   . Thyroid disease   . Arthritis   . High cholesterol     Past Surgical History  Procedure Date  . Abdominal hysterectomy   . Ovary surgery   . Shoulder surgery     Family History  Problem Relation Age of Onset  . Diabetes Mother   . Hypertension Father   . Diabetes Sister   . Diabetes Brother   . Hypertension Brother     Social History:  reports that she has never smoked. She does not have any smokeless tobacco history on file. She reports that she does not drink alcohol or use illicit drugs.  Allergies:  Allergies  Allergen Reactions  . Codeine Swelling    Facial swelling  . Remicade (Infliximab)     "sent me into shock"  . Zestril (Lisinopril) Swelling    Face and neck swelling    Medications: I have reviewed the patient's current  medications.  Results for orders placed during the hospital encounter of 01/28/12 (from the past 48 hour(s))  CBC WITH DIFFERENTIAL     Status: Abnormal   Collection Time   01/27/12 10:25 PM      Component Value Range Comment   WBC 5.3  4.0 - 10.5 K/uL    RBC 4.36  3.87 - 5.11 MIL/uL    Hemoglobin 13.2  12.0 - 15.0 g/dL    HCT 40.8  36.0 - 46.0 %    MCV 93.6  78.0 - 100.0 fL    MCH 30.3  26.0 - 34.0 pg    MCHC 32.4  30.0 - 36.0 g/dL    RDW 15.2  11.5 - 15.5 %    Platelets 156  150 - 400 K/uL    Neutrophils Relative 60  43 - 77 %    Neutro Abs 3.2  1.7 - 7.7 K/uL    Lymphocytes Relative 27  12 - 46 %    Lymphs Abs 1.4  0.7 - 4.0 K/uL    Monocytes Relative 7  3 - 12 %    Monocytes Absolute 0.4  0.1 - 1.0 K/uL    Eosinophils Relative 7 (*) 0 - 5 %    Eosinophils Absolute 0.4  0.0 - 0.7 K/uL    Basophils Relative 0  0 - 1 %  Basophils Absolute 0.0  0.0 - 0.1 K/uL   COMPREHENSIVE METABOLIC PANEL     Status: Abnormal   Collection Time   01/27/12 10:25 PM      Component Value Range Comment   Sodium 136  135 - 145 mEq/L    Potassium 3.9  3.5 - 5.1 mEq/L    Chloride 98  96 - 112 mEq/L    CO2 32  19 - 32 mEq/L    Glucose, Bld 121 (*) 70 - 99 mg/dL    BUN 19  6 - 23 mg/dL    Creatinine, Ser 1.09  0.50 - 1.10 mg/dL    Calcium 9.4  8.4 - 10.5 mg/dL    Total Protein 6.9  6.0 - 8.3 g/dL    Albumin 3.8  3.5 - 5.2 g/dL    AST 16  0 - 37 U/L    ALT 24  0 - 35 U/L    Alkaline Phosphatase 60  39 - 117 U/L    Total Bilirubin 1.1  0.3 - 1.2 mg/dL    GFR calc non Af Amer 50 (*) >90 mL/min    GFR calc Af Amer 58 (*) >90 mL/min   POCT I-STAT TROPONIN I     Status: Normal   Collection Time   01/27/12 11:41 PM      Component Value Range Comment   Troponin i, poc 0.05  0.00 - 0.08 ng/mL    Comment 3            POCT I-STAT TROPONIN I     Status: Abnormal   Collection Time   01/28/12  9:37 AM      Component Value Range Comment   Troponin i, poc 0.15 (*) 0.00 - 0.08 ng/mL    Comment NOTIFIED  PHYSICIAN      Comment 3            TROPONIN I     Status: Normal   Collection Time   01/28/12 10:31 AM      Component Value Range Comment   Troponin I <0.30  <0.30 ng/mL     Dg Chest 2 View  01/27/2012  *RADIOLOGY REPORT*  Clinical Data: Chest pain.  Headache.  Vomiting.  CHEST - 2 VIEW  Comparison: 01/02/2010.  Findings: Shallow inspiration.  Mild cardiac enlargement with increased pulmonary vascularity suggesting mild vascular congestion.  Infiltration or atelectasis in the lung bases.  No blunting of costophrenic angles.  No pneumothorax.  Tortuous aorta. Degenerative changes in the spine.  IMPRESSION: Mild cardiac enlargement and pulmonary vascular congestion. Bilateral basilar atelectasis or infiltration.   Original Report Authenticated By: Neale Burly, M.D.    [facility] Contrast  01/28/2012  *RADIOLOGY REPORT*  Clinical Data:  71 year old female with left side facial pain, headache.  Left cavernous sinus region mass on face CT.  MRI HEAD WITHOUT AND WITH CONTRAST  Technique: Multiplanar, multiecho pulse sequences of the brain and surrounding structures were obtained according to standard protocol without and with intravenous contrast.  Contrast: 63m MULTIHANCE GADOBENATE DIMEGLUMINE 529 MG/ML IV SOLN  Comparison: Face CT with contrast 01/28/2012.  Findings:  Normal cerebral volume.  No ventriculomegaly. No restricted diffusion to suggest acute infarction.  No acute intracranial hemorrhage identified.  No midline shift.  Pituitary appears within normal limits.  Negative cervicomedullary junction visualized cervical spine. Major intracranial vascular flow voids are preserved, and MRA findings are below.  Abnormality at the left cavernous sinus seen on the earlier comparison correspond  to abnormal soft tissue infiltrating the left sinus and projecting slightly posteriorly off of the posterior clinoid process.  The mass shows mildly abnormal diffusion (series 411), decreased T2 signal  (series 10 images 16 and 17), isointense to cortex T1 signal, postcontrast enhancement with a central nonenhancing portion (series 11 image 18) and associated dural thickening with dural tail tracking into the left middle cranial fossa (image 17).  The mass encompasses 13 x 22 x 12 mm (transverse by AP by CC) not including all of the lateral extending dural tail.  The left fifth nerve cisternal segment appears mildly deviated laterally.  There is dural thickening surrounding the left trigeminal ganglion at [employer] (series 12 image 16).  No abnormal enhancement of the ganglion itself, or the cisternal nerves.  There is cavernous sinus infiltration.  No thickening or enhancement of the left V2 segment.  No associated cerebral edema.  Hypoplastic left anterior clinoid process is noted, no definite orbital apex involvement by the mass.  Elsewhere, no other abnormal enhancement.  Scattered cerebral white matter and central pons T2 and FLAIR hyperintensity, nonspecific but most commonly related to small vessel disease.  Superimposed dilated perivascular spaces including in the deep gray matter nuclei.  No cortical encephalomalacia.  Negative cerebellum.  Orbit soft tissues are within normal limits. Visualized paranasal sinuses and mastoids are clear.  Negative scalp soft tissues.  IMPRESSION:  1.  Infiltrative lesion of the left cavernous sinus with posterior extension off the posterior clinoid process as seen on the comparison CT.  Associated dural thickening and dural tail extending in the left middle cranial fossa. 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 Malignant tumor such as lymphoma is considered but felt less likely. 2.   Left cavernous sinus infiltration by the mass and dural thickening surrounding the left gasserian ganglion.  No definite left orbital apex involvement. 3.  No other acute intracranial  abnormality.  Nonspecific signal changes in the brain, favor due to chronic small vessel disease. 4.  MRA findings are below.  MRA HEAD WITHOUT CONTRAST  Technique: Angiographic images of the Circle of Willis were obtained using MRA technique without  intravenous contrast.  Findings:  Artifact at the cervicomedullary junction. Antegrade flow in the posterior circulation with codominant distal vertebral arteries.  The no distal vertebral stenosis.  Normal vertebrobasilar junction.  No basilar stenosis.  Mild basilar tortuosity.  AICA vessel origins are patent.  SCA and PCA origins are patent.  Fetal type left PCA.  Bilateral PCA branches are within normal limits.  Left posterior communicating artery diminutive or absent.  Antegrade flow in both ICA siphons.  No ICA stenosis.  In the left cavernous ICA and left ICA terminus there is no flow signal in the region of the soft tissue mass (series 9 image 67).  Normal bilateral MCA origins.  Normal left ACA origin.  Hypoplastic right ACA A1 segment with an infundibulum at its origin.  Anterior communicating artery within normal limits.  Visualized ACA branches within normal limits.  Visualized bilateral MCA branches within normal limits.  IMPRESSION: 1.  No flow signal in the region of the left cavernous sinus mass, in keeping with the favored diagnosis of meningioma. 2.  Essentially negative intracranial MRA; hypoplastic right ACA A1 segment with an infundibulum at its origin.   Original Report Authenticated By: Randall An, M.D.    [facility] Contrast  01/28/2012  *  RADIOLOGY REPORT*  Clinical Data:  71 year old female with left side facial pain, headache.  Left cavernous sinus region mass on face CT.  MRI HEAD WITHOUT AND WITH CONTRAST  Technique: Multiplanar, multiecho pulse sequences of the brain and surrounding structures were obtained according to standard protocol without and with intravenous contrast.  Contrast: 86m MULTIHANCE GADOBENATE DIMEGLUMINE 529  MG/ML IV SOLN  Comparison: Face CT with contrast 01/28/2012.  Findings:  Normal cerebral volume.  No ventriculomegaly. No restricted diffusion to suggest acute infarction.  No acute intracranial hemorrhage identified.  No midline shift.  Pituitary appears within normal limits.  Negative cervicomedullary junction visualized cervical spine. Major intracranial vascular flow voids are preserved, and MRA findings are below.  Abnormality at the left cavernous sinus seen on the earlier comparison correspond to abnormal soft tissue infiltrating the left sinus and projecting slightly posteriorly off of the posterior clinoid process.  The mass shows mildly abnormal diffusion (series 411), decreased T2 signal (series 10 images 16 and 17), isointense to cortex T1 signal, postcontrast enhancement with a central nonenhancing portion (series 11 image 18) and associated dural thickening with dural tail tracking into the left middle cranial fossa (image 17).  The mass encompasses 13 x 22 x 12 mm (transverse by AP by CC) not including all of the lateral extending dural tail.  The left fifth nerve cisternal segment appears mildly deviated laterally.  There is dural thickening surrounding the left trigeminal ganglion at [employer](series 12 image 16).  No abnormal enhancement of the ganglion itself, or the cisternal nerves.  There is cavernous sinus infiltration.  No thickening or enhancement of the left V2 segment.  No associated cerebral edema.  Hypoplastic left anterior clinoid process is noted, no definite orbital apex involvement by the mass.  Elsewhere, no other abnormal enhancement.  Scattered cerebral white matter and central pons T2 and FLAIR hyperintensity, nonspecific but most commonly related to small vessel disease.  Superimposed dilated perivascular spaces including in the deep gray matter nuclei.  No cortical encephalomalacia.  Negative cerebellum.  Orbit soft tissues are within normal limits. Visualized paranasal  sinuses and mastoids are clear.  Negative scalp soft tissues.  IMPRESSION:  1.  Infiltrative lesion of the left cavernous sinus with posterior extension off the posterior clinoid process as seen on the comparison CT.  Associated dural thickening and dural tail extending in the left middle cranial fossa. 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 Malignant tumor such as lymphoma is considered but felt less likely. 2.   Left cavernous sinus infiltration by the mass and dural thickening surrounding the left gasserian ganglion.  No definite left orbital apex involvement. 3.  No other acute intracranial abnormality.  Nonspecific signal changes in the brain, favor due to chronic small vessel disease. 4.  MRA findings are below.  MRA HEAD WITHOUT CONTRAST  Technique: Angiographic images of the Circle of Willis were obtained using MRA technique without  intravenous contrast.  Findings:  Artifact at the cervicomedullary junction. Antegrade flow in the posterior circulation with codominant distal vertebral arteries.  The no distal vertebral stenosis.  Normal vertebrobasilar junction.  No basilar stenosis.  Mild basilar tortuosity.  AICA vessel origins are patent.  SCA and PCA origins are patent.  Fetal type left PCA.  Bilateral PCA branches are within normal limits.  Left posterior communicating artery diminutive or absent.  Antegrade flow in both ICA siphons.  No ICA  stenosis.  In the left cavernous ICA and left ICA terminus there is no flow signal in the region of the soft tissue mass (series 9 image 67).  Normal bilateral MCA origins.  Normal left ACA origin.  Hypoplastic right ACA A1 segment with an infundibulum at its origin.  Anterior communicating artery within normal limits.  Visualized ACA branches within normal limits.  Visualized bilateral MCA branches within normal limits.  IMPRESSION: 1.  No flow signal in the region of  the left cavernous sinus mass, in keeping with the favored diagnosis of meningioma. 2.  Essentially negative intracranial MRA; hypoplastic right ACA A1 segment with an infundibulum at its origin.   Original Report Authenticated By: Randall An, M.D.    Ct Maxillofacial W/cm  01/28/2012  *RADIOLOGY REPORT*  Clinical Data: Left-sided facial pain and fever.  Headache.  CT MAXILLOFACIAL WITH CONTRAST  Technique:  Multidetector CT imaging of the maxillofacial structures was performed with intravenous contrast. Multiplanar CT image reconstructions were also generated.  Contrast: 26m OMNIPAQUE IOHEXOL 300 MG/ML  SOLN  Comparison: Cervical spine radiographs 02/25/2007.  Findings: Visualization of some portions of the scan limited due to streak artifact arising from dental hardware.  Minimal infiltration in the subcutaneous fat over the left mandibular region is demonstrated.  No discrete fluid collection or contrast enhancement to suggest abscess.  Cervical lymph nodes are not pathologically enlarged.  Salivary glands appear symmetrical. No mucosal or prevertebral mass lesions appreciated.  Visualized jugular and carotid vessels appear patent.  No displacement of vessels or fat planes.  Visualized paranasal sinuses are not opacified.  Degenerative changes in the upper cervical spine. Degenerative changes in the temporomandibular joints bilaterally with joint space narrowing, deformity of the mandibular heads, and subcortical cystic changes.  There is an expansile lesion with central hypo density laterally to the left of the pituitary fossa.  This could represent a mass lesion or aneurysm arising in the region of Meckel's cave.  The lesion measures about 11 x 12 x 7 mm.  MRI correlation is recommended.  IMPRESSION: Mass lesion or possibly aneurysm arising to the left of the pituitary fossa.  MRI recommended for further evaluation.  Minimal soft tissue infiltration on the left without evidence of abscess. Degenerative  changes in the cervical spine and temporomandibular joints.   Original Report Authenticated By: WNeale Burly M.D.     _0 @ Blood pressure 104/59, pulse 72, temperature 98.1 F (36.7 C), temperature source Oral, resp. rate 13, SpO2 97.00%. Patient is awake alert and oriented pupils are equal round and reactive to light I attempted to view her fundi but due to patient's noncompliance with the ophthalmologic exam I was unable to see her optic discs, extraocular movements appear to be intact cranial nerve exam appears to be otherwise intact. Strength is 5 out of 5 in her upper and lower extremities and no pathologic reflexes and no sensorimotor deficits. She has no pronator drift.   Assessment/Plan: Patient presents with possible cavernous sinus syndrome or trigeminal neuralgia she is having some right orbital pain she denies any changes in her vision per se except as related to the pain. She has a workup revealed a cavernous sinus mass probable meningioma although possibly was raised of lymphoma or think is also as very small chance this could be an infectious process I recommend neurology consult for also evaluation workup possible cavernous sinus syndrome and unexplained mass left cavernous sinus application Decadron and start her on Neurontin and ordered a C-reactive protein and  an erythrocyte sedimentation rate this is a nonsurgical lesion that once the patient stabilized and cardiac perspective I would recommend training evaluation at either at this hospital by Dr. Harrison Mons or Dr. Angelene Giovanni for possible biopsy or stereotactic radiosurgery.  [name] 01/28/2012, 2:59 PM

## 2012-01-28 NOTE — ED Notes (Signed)
Pt transported to MRI 

## 2012-01-28 NOTE — ED Notes (Signed)
Family at bedside. 

## 2012-01-28 NOTE — ED Provider Notes (Signed)
History     CSN: [id number]  Arrival date & time 01/27/12  2213   First MD Initiated Contact with Patient 01/28/12 0201      Chief Complaint  Patient presents with  . Chest Pain    (Consider location/radiation/quality/duration/timing/severity/associated sxs/prior treatment) Patient is a 71 y.o. female presenting with chest pain. The history is provided by the patient.  Chest Pain   She noted onset yesterday of a burning pain in the left side of her face. Pain radiated down into her neck and into her chest. It is worse with any kind of movement including facial movement. She denied any dyspnea, but did develop nausea and vomiting this evening. Pain is moderately severe and she rates it at 8/10. Nothing made it better. She tried taking Claritin and Ultram with no relief. She thought it was a sinus infection, but she has not had any nasal drainage or fever. She denies cough. She has noted a bumpy rash on her cheeks.  Past Medical History  Diagnosis Date  . Diabetes mellitus   . Hypertension   . Thyroid disease   . Arthritis   . High cholesterol     Past Surgical History  Procedure Date  . Abdominal hysterectomy   . Ovary surgery   . Shoulder surgery     Family History  Problem Relation Age of Onset  . Diabetes Mother   . Hypertension Father   . Diabetes Sister   . Diabetes Brother   . Hypertension Brother     History  Substance Use Topics  . Smoking status: Never Smoker   . Smokeless tobacco: Not on file  . Alcohol Use: No    OB History    Grav Para Term Preterm Abortions TAB SAB Ect Mult Living                  Review of Systems  Cardiovascular: Positive for chest pain.  All other systems reviewed and are negative.    Allergies  Codeine; Remicade; and Zestril  Home Medications   Current Outpatient Rx  Name Route Sig Dispense Refill  . ACETAMINOPHEN 500 MG PO TABS Oral Take 500 mg by mouth 2 (two) times daily. Takes with Tramadol doses Scheduled  doses    . ATORVASTATIN CALCIUM 20 MG PO TABS Oral Take 20 mg by mouth daily.    [name] HYDROXYCHLOROQUINE SULFATE 200 MG PO TABS Oral Take 200 mg by mouth 2 (two) times daily.     [name] LEVOTHYROXINE SODIUM 50 MCG PO TABS Oral Take 50 mcg by mouth daily.    [name] METHOTREXATE (ANTI-RHEUMATIC) 2.5 MG PO TABS Oral Take 7.5 mg by mouth once a week. Usually on Thursday    . METOPROLOL SUCCINATE ER 100 MG PO TB24 Oral Take 100 mg by mouth daily. Take with or immediately following a meal.    . OLMESARTAN MEDOXOMIL 40 MG PO TABS Oral Take 40 mg by mouth daily.    [name] PREDNISONE 5 MG PO TABS Oral Take 5 mg by mouth daily.    [name] SPIRONOLACTONE 25 MG PO TABS Oral Take 25 mg by mouth.    . TRAMADOL HCL ER 100 MG PO TB24 Oral Take 100 mg by mouth 2 (two) times daily. Pain Scheduled doses      There were no vitals taken for this visit.  Physical Exam  Nursing note and vitals reviewed. 71 year old female, resting comfortably and in no acute distress. Vital signs are normal. Oxygen saturation is 97%,  which is normal. Head is normocephalic and atraumatic. PERRLA, EOMI. Oropharynx is clear. There is a very faint, erythematous, papular rash in the malar areas. There is moderate tenderness to palpation over the maxillary sinuses. The examination and nasal cavity shows no purulent drainage and no edema of the turbinates. Neck is nontender and supple without adenopathy or JVD. Back is nontender and there is no CVA tenderness. Lungs are clear without rales, wheezes, or rhonchi. Chest has moderate right-sided tenderness. Heart has regular rate and rhythm without murmur. Abdomen is soft, flat, nontender without masses or hepatosplenomegaly and peristalsis is normoactive. Extremities have no cyanosis or edema, full range of motion is present. Skin is warm and dry without other rash. Neurologic: Mental status is normal, cranial nerves are intact, there are no motor or sensory deficits.   ED Course  Procedures (including  critical care time)  Results for orders placed during the hospital encounter of 01/28/12  CBC WITH DIFFERENTIAL      Component Value Range   WBC 5.3  4.0 - 10.5 K/uL   RBC 4.36  3.87 - 5.11 MIL/uL   Hemoglobin 13.2  12.0 - 15.0 g/dL   HCT 40.8  36.0 - 46.0 %   MCV 93.6  78.0 - 100.0 fL   MCH 30.3  26.0 - 34.0 pg   MCHC 32.4  30.0 - 36.0 g/dL   RDW 15.2  11.5 - 15.5 %   Platelets 156  150 - 400 K/uL   Neutrophils Relative 60  43 - 77 %   Neutro Abs 3.2  1.7 - 7.7 K/uL   Lymphocytes Relative 27  12 - 46 %   Lymphs Abs 1.4  0.7 - 4.0 K/uL   Monocytes Relative 7  3 - 12 %   Monocytes Absolute 0.4  0.1 - 1.0 K/uL   Eosinophils Relative 7 (*) 0 - 5 %   Eosinophils Absolute 0.4  0.0 - 0.7 K/uL   Basophils Relative 0  0 - 1 %   Basophils Absolute 0.0  0.0 - 0.1 K/uL  COMPREHENSIVE METABOLIC PANEL      Component Value Range   Sodium 136  135 - 145 mEq/L   Potassium 3.9  3.5 - 5.1 mEq/L   Chloride 98  96 - 112 mEq/L   CO2 32  19 - 32 mEq/L   Glucose, Bld 121 (*) 70 - 99 mg/dL   BUN 19  6 - 23 mg/dL   Creatinine, Ser 1.09  0.50 - 1.10 mg/dL   Calcium 9.4  8.4 - 10.5 mg/dL   Total Protein 6.9  6.0 - 8.3 g/dL   Albumin 3.8  3.5 - 5.2 g/dL   AST 16  0 - 37 U/L   ALT 24  0 - 35 U/L   Alkaline Phosphatase 60  39 - 117 U/L   Total Bilirubin 1.1  0.3 - 1.2 mg/dL   GFR calc non Af Amer 50 (*) >90 mL/min   GFR calc Af Amer 58 (*) >90 mL/min  POCT I-STAT TROPONIN I      Component Value Range   Troponin i, poc 0.05  0.00 - 0.08 ng/mL   Comment 3            Dg Chest 2 View  01/27/2012  *RADIOLOGY REPORT*  Clinical Data: Chest pain.  Headache.  Vomiting.  CHEST - 2 VIEW  Comparison: 01/02/2010.  Findings: Shallow inspiration.  Mild cardiac enlargement with increased pulmonary vascularity suggesting mild vascular congestion.  Infiltration  or atelectasis in the lung bases.  No blunting of costophrenic angles.  No pneumothorax.  Tortuous aorta. Degenerative changes in the spine.  IMPRESSION:  Mild cardiac enlargement and pulmonary vascular congestion. Bilateral basilar atelectasis or infiltration.   Original Report Authenticated By: Neale Burly, M.D.     ECG shows normal sinus rhythm with a rate of 73, no ectopy. Normal axis. Normal P wave. Normal QRS. Normal intervals. Normal ST and T waves. Impression: normal ECG. No prior ECG available for comparison.   No diagnosis found.    MDM  Facial pain which may be due 2 sinusitis. That could conceivably account for the facial rash noted. Alternatively, this may all be part of a viral syndrome. Facial pain might be consistent with trigeminal neuralgia, but radiation of pain to the neck and chest would not be consistent with that. He denies any evidence of cardiac pathology. CT scan will be obtained to evaluate for possible sinusitis and she will be given analgesics for pain control. Case will be endorsed to Dr. Audie Pinto.        Delora Fuel, MD 96/22/29 [id number]

## 2012-01-28 NOTE — ED Notes (Signed)
Pt. Medicated for her pain behind her eyes with Morphine 4 mg IV.

## 2012-01-28 NOTE — Progress Notes (Signed)
Disposition Note  [name], is a 71 y.o. female,   MRN: [id number]  -  DOB - 21-Jul-1940  Outpatient Primary MD for the patient is Maximino Greenland, MD   Blood pressure 111/72, pulse 74, temperature 98.1 F (36.7 C), temperature source Oral, resp. rate 14, SpO2 99.00%.  Active Problems:  Brain mass  Chest pain  High cholesterol  Arthritis  Thyroid disease  Hypertension   71 yo female presents with left sided facial burning and left eye pain as well as nausea, vomiting and chest pain. Originally patient was diagnosed with NSTEMI, started on a nitro drip and given aspirin.  Repeat troponin (not POC) came back negative.  Now the thinking is that she has not had a nstemi, but needs to be ruled out.  She continues on the nitro drip.  Heparin infusion was considered however this is pending [place] cardiology consult.  With regards to the left sided facial pain, CT maxillofacial shows a mass lesion 11x12x7 mm arising to the left of the pituitary.  Dr.  Saintclair Halsted of Neurosurgery felt the lesion was inoperable but recommended a Neurology consult.   Dr. Doy Mince has agreed to see the patient.  I have requested a step down bed given the patient is on a nitro drip (she is now chest pain free).  Cardiology consultation is pending - Heparin drip being considered.  Neurology consultation is pending.  Hopefully the patient's bed request will be able to be down graded.   Imogene Burn, PA-C [facility] Pager: [phone]

## 2012-01-28 NOTE — Consult Note (Signed)
Chief Complaint: cavernous sinus meningioma, Trigeminal Neuralgia HPI: This is a 71 y/o RH morbidly obese WF  Who has been complaining of asymmetrical left sided facial pain, with jaw claudication and an MRI was done which showed Left Cavernous Sinus mass most probably Meningioma. Patient was seen by Neurosurgeon and Neurologist was consulted. Patient seen at the bedside, she is awake, alert and follows commands. She has received Morphine earlier and the facial pain had significantly subsided.  She gives detail history and mentions that she has been having this facial pain for past few days which is exacerbated with chewing, laughing or even during swallowing. She has retro orbital pain but no history of migraines. She denies headaches, no dizziness, no vertigo, no tinnitus, no neck pain, no  Motor or sensory deficits in the extremities.  She has RA and has significant residual weakness and for which she is on chronic steroids and Methotrexate. She has chronic dysphagia and had un Upper endoscopy done and according to the daughter patient had a narrowing of the upper esophagus. She denies history of breast cancer or thyroid cancer. Or goiter Patient is afebrile and there are no other complaints.    Past Medical History  Diagnosis Date  . Diabetes mellitus   . Hypertension   . Thyroid disease   . Arthritis   . High cholesterol     Past Surgical History  Procedure Date  . Abdominal hysterectomy   . Ovary surgery   . Shoulder surgery     Family History  Problem Relation Age of Onset  . Diabetes Mother   . Hypertension Father   . Diabetes Sister   . Diabetes Brother   . Hypertension Brother   . Heart attack Mother   . Lung cancer Father    Social History:  reports that she has never smoked. She does not have any smokeless tobacco history on file. She reports that she does not drink alcohol or use illicit drugs.  Allergies:  Allergies  Allergen Reactions  . Codeine Swelling      Facial swelling  . Remicade (Infliximab)     "sent me into shock"  . Zestril (Lisinopril) Swelling    Face and neck swelling    ROS:  All the 12 point review of systems were reviewed and the pertinent ones are mentioned in the  HPI  Physical Examination: Blood pressure 109/56, pulse 74, temperature 98.1 F (36.7 C), temperature source Oral, resp. rate 14, SpO2 95.00%.  General Examination:    HEENT-  Normocephalic, no lesions, without obvious abnormality. Normal TM's bilaterally.  Normal auditory canals and external ears. Normal external nose, mucus membranes and septum.  Normal pharynx. Neck supple with no masses, nodes, nodules or enlargement.  Eyes: Please see in the Neuro section  Cardiovascular - RRR, no murmurs, rubs and or gallops Lungs - CTA Abdomen - Obese, Bowel sounds present, , non tender, non distended Extremities -  Edema , joint deformities in UE Skin: No rash  Neurologic Examination: Mental Status: Alert, oriented, thought content appropriate.   Memory: intact No hallucinations\ No delusions Mood: pleasant Able to follow 3 step commands  Speech fluent without evidence of aphasia or dysarthria  Cranial Nerves:  pupils equal, round, reactive to light and accommodation, isocoria Ptosis in the left Eye,  Unable to do the upward gaze B/L Able to adduct but has some adduction  Deficits on the left.  Patient has facial hyperesthesia on the V1 and V2  Differentiation.  However soft touch was intact Hearing; Bone conduction better than Nerve conduction Smile is symmetrical, No platysma paralysis GAG intact Tongue midline   Motor: Right :   Able to move all the 4 extremities antigravity but has significant pain secondary to RA.  Patient does not ambulate on regular basis.  Atrophy noted in the intrinsic, flexors and extensors of the hand muscles.  Sensory: Pinprick and light touch intact throughout, bilaterally  ( in the extremities).,    Deep Tendon  Reflexes: 2+ in the UE and 0 in the patellar and  Achilles Plantars: Right: downgoing   Left: downgoing Cerebellar: normal finger-to-nose, normal rapid alternating movements  Gait: Deferred   Laboratory Studies: Basic Metabolic Panel:  Lab 76/73/41 2225  NA 136  K 3.9  CL 98  CO2 32  GLUCOSE 121*  BUN 19  CREATININE 1.09  CALCIUM 9.4  MG --  PHOS --    Liver Function Tests:  Lab 01/27/12 2225  AST 16  ALT 24  ALKPHOS 60  BILITOT 1.1  PROT 6.9  ALBUMIN 3.8   No results found for this basename: LIPASE:5,AMYLASE:5 in the last 168 hours No results found for this basename: AMMONIA:3 in the last 168 hours  CBC:  Lab 01/27/12 2225  WBC 5.3  NEUTROABS 3.2  HGB 13.2  HCT 40.8  MCV 93.6  PLT 156    Cardiac Enzymes:  Lab 01/28/12 1611 01/28/12 1031  CKTOTAL 87 --  CKMB 1.2 --  CKMBINDEX -- --  TROPONINI <0.30 <0.30    BNP: No components found with this basename: POCBNP:5  CBG: No results found for this basename: GLUCAP:5 in the last 168 hours  Microbiology: No results found for this or any previous visit.  Coagulation Studies:  Basename 01/28/12 1454  LABPROT 14.0  INR 1.06    Urinalysis: No results found for this basename: COLORURINE:2,APPERANCEUR:2,LABSPEC:2,PHURINE:2,GLUCOSEU:2,HGBUR:2,BILIRUBINUR:2,KETONESUR:2,PROTEINUR:2,UROBILINOGEN:2,NITRITE:2,LEUKOCYTESUR:2 in the last 168 hours  Lipid Panel: No results found for this basename: chol, trig, hdl, cholhdl, vldl, ldlcalc    HgbA1C: No results found for this basename: HGBA1C    Urine Drug Screen:   No results found for this basename: labopia, cocainscrnur, labbenz, amphetmu, thcu, labbarb     Alcohol Level: No results found for this basename: ETH:2 in the last 168 hours  Other results: EKG: normal EKG, normal sinus rhythm, unchanged from previous tracings.  Imaging: Dg Chest 2 View  01/27/2012  *RADIOLOGY REPORT*  Clinical Data: Chest pain.  Headache.  Vomiting.  CHEST - 2 VIEW   Comparison: 01/02/2010.  Findings: Shallow inspiration.  Mild cardiac enlargement with increased pulmonary vascularity suggesting mild vascular congestion.  Infiltration or atelectasis in the lung bases.  No blunting of costophrenic angles.  No pneumothorax.  Tortuous aorta. Degenerative changes in the spine.  IMPRESSION: Mild cardiac enlargement and pulmonary vascular congestion. Bilateral basilar atelectasis or infiltration.   Original Report Authenticated By: Neale Burly, M.D.    Mr [facility] Wo Contrast  01/28/2012  *RADIOLOGY REPORT*  Clinical Data:  71 year old female with left side facial pain, headache.  Left cavernous sinus region mass on face CT.  MRI HEAD WITHOUT AND WITH CONTRAST  Technique: Multiplanar, multiecho pulse sequences of the brain and surrounding structures were obtained according to standard protocol without and with intravenous contrast.  Contrast: 68m MULTIHANCE GADOBENATE DIMEGLUMINE 529 MG/ML IV SOLN  Comparison: Face CT with contrast 01/28/2012.  Findings:  Normal cerebral volume.  No ventriculomegaly. No restricted diffusion to suggest acute infarction.  No acute intracranial hemorrhage identified.  No midline shift.  Pituitary appears within normal limits.  Negative cervicomedullary junction visualized cervical spine. Major intracranial vascular flow voids are preserved, and MRA findings are below.  Abnormality at the left cavernous sinus seen on the earlier comparison correspond to abnormal soft tissue infiltrating the left sinus and projecting slightly posteriorly off of the posterior clinoid process.  The mass shows mildly abnormal diffusion (series 411), decreased T2 signal (series 10 images 16 and 17), isointense to cortex T1 signal, postcontrast enhancement with a central nonenhancing portion (series 11 image 18) and associated dural thickening with dural tail tracking into the left middle cranial fossa (image 17).  The mass encompasses 13 x 22 x 12 mm (transverse by  AP by CC) not including all of the lateral extending dural tail.  The left fifth nerve cisternal segment appears mildly deviated laterally.  There is dural thickening surrounding the left trigeminal ganglion at [employer] (series 12 image 16).  No abnormal enhancement of the ganglion itself, or the cisternal nerves.  There is cavernous sinus infiltration.  No thickening or enhancement of the left V2 segment.  No associated cerebral edema.  Hypoplastic left anterior clinoid process is noted, no definite orbital apex involvement by the mass.  Elsewhere, no other abnormal enhancement.  Scattered cerebral white matter and central pons T2 and FLAIR hyperintensity, nonspecific but most commonly related to small vessel disease.  Superimposed dilated perivascular spaces including in the deep gray matter nuclei.  No cortical encephalomalacia.  Negative cerebellum.  Orbit soft tissues are within normal limits. Visualized paranasal sinuses and mastoids are clear.  Negative scalp soft tissues.  IMPRESSION:  1.  Infiltrative lesion of the left cavernous sinus with posterior extension off the posterior clinoid process as seen on the comparison CT.  Associated dural thickening and dural tail extending in the left middle cranial fossa. 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 Malignant tumor such as lymphoma is considered but felt less likely. 2.   Left cavernous sinus infiltration by the mass and dural thickening surrounding the left gasserian ganglion.  No definite left orbital apex involvement. 3.  No other acute intracranial abnormality.  Nonspecific signal changes in the brain, favor due to chronic small vessel disease. 4.  MRA findings are below.  MRA HEAD WITHOUT CONTRAST  Technique: Angiographic images of the Circle of Willis were obtained using MRA technique without  intravenous contrast.  Findings:  Artifact at the  cervicomedullary junction. Antegrade flow in the posterior circulation with codominant distal vertebral arteries.  The no distal vertebral stenosis.  Normal vertebrobasilar junction.  No basilar stenosis.  Mild basilar tortuosity.  AICA vessel origins are patent.  SCA and PCA origins are patent.  Fetal type left PCA.  Bilateral PCA branches are within normal limits.  Left posterior communicating artery diminutive or absent.  Antegrade flow in both ICA siphons.  No ICA stenosis.  In the left cavernous ICA and left ICA terminus there is no flow signal in the region of the soft tissue mass (series 9 image 67).  Normal bilateral MCA origins.  Normal left ACA origin.  Hypoplastic right ACA A1 segment with an infundibulum at its origin.  Anterior communicating artery within normal limits.  Visualized ACA branches within normal limits.  Visualized bilateral MCA branches within normal limits.  IMPRESSION: 1.  No flow signal in the region of the left cavernous sinus mass, in keeping with the favored  diagnosis of meningioma. 2.  Essentially negative intracranial MRA; hypoplastic right ACA A1 segment with an infundibulum at its origin.   Original Report Authenticated By: Randall An, M.D.    Mr [name] Wo Contrast  01/28/2012  *RADIOLOGY REPORT*  Clinical Data:  71 year old female with left side facial pain, headache.  Left cavernous sinus region mass on face CT.  MRI HEAD WITHOUT AND WITH CONTRAST  Technique: Multiplanar, multiecho pulse sequences of the brain and surrounding structures were obtained according to standard protocol without and with intravenous contrast.  Contrast: 54m MULTIHANCE GADOBENATE DIMEGLUMINE 529 MG/ML IV SOLN  Comparison: Face CT with contrast 01/28/2012.  Findings:  Normal cerebral volume.  No ventriculomegaly. No restricted diffusion to suggest acute infarction.  No acute intracranial hemorrhage identified.  No midline shift.  Pituitary appears within normal limits.  Negative  cervicomedullary junction visualized cervical spine. Major intracranial vascular flow voids are preserved, and MRA findings are below.  Abnormality at the left cavernous sinus seen on the earlier comparison correspond to abnormal soft tissue infiltrating the left sinus and projecting slightly posteriorly off of the posterior clinoid process.  The mass shows mildly abnormal diffusion (series 411), decreased T2 signal (series 10 images 16 and 17), isointense to cortex T1 signal, postcontrast enhancement with a central nonenhancing portion (series 11 image 18) and associated dural thickening with dural tail tracking into the left middle cranial fossa (image 17).  The mass encompasses 13 x 22 x 12 mm (transverse by AP by CC) not including all of the lateral extending dural tail.  The left fifth nerve cisternal segment appears mildly deviated laterally.  There is dural thickening surrounding the left trigeminal ganglion at [employer](series 12 image 16).  No abnormal enhancement of the ganglion itself, or the cisternal nerves.  There is cavernous sinus infiltration.  No thickening or enhancement of the left V2 segment.  No associated cerebral edema.  Hypoplastic left anterior clinoid process is noted, no definite orbital apex involvement by the mass.  Elsewhere, no other abnormal enhancement.  Scattered cerebral white matter and central pons T2 and FLAIR hyperintensity, nonspecific but most commonly related to small vessel disease.  Superimposed dilated perivascular spaces including in the deep gray matter nuclei.  No cortical encephalomalacia.  Negative cerebellum.  Orbit soft tissues are within normal limits. Visualized paranasal sinuses and mastoids are clear.  Negative scalp soft tissues.  IMPRESSION:  1.  Infiltrative lesion of the left cavernous sinus with posterior extension off the posterior clinoid process as seen on the comparison CT.  Associated dural thickening and dural tail extending in the left middle  cranial fossa. 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 Malignant tumor such as lymphoma is considered but felt less likely. 2.   Left cavernous sinus infiltration by the mass and dural thickening surrounding the left gasserian ganglion.  No definite left orbital apex involvement. 3.  No other acute intracranial abnormality.  Nonspecific signal changes in the brain, favor due to chronic small vessel disease. 4.  MRA findings are below.  MRA HEAD WITHOUT CONTRAST  Technique: Angiographic images of the Circle of Willis were obtained using MRA technique without  intravenous contrast.  Findings:  Artifact at the cervicomedullary junction. Antegrade flow in the posterior circulation with codominant distal vertebral arteries.  The no distal vertebral stenosis.  Normal vertebrobasilar junction.  No basilar stenosis.  Mild basilar tortuosity.  AICA vessel  origins are patent.  SCA and PCA origins are patent.  Fetal type left PCA.  Bilateral PCA branches are within normal limits.  Left posterior communicating artery diminutive or absent.  Antegrade flow in both ICA siphons.  No ICA stenosis.  In the left cavernous ICA and left ICA terminus there is no flow signal in the region of the soft tissue mass (series 9 image 67).  Normal bilateral MCA origins.  Normal left ACA origin.  Hypoplastic right ACA A1 segment with an infundibulum at its origin.  Anterior communicating artery within normal limits.  Visualized ACA branches within normal limits.  Visualized bilateral MCA branches within normal limits.  IMPRESSION: 1.  No flow signal in the region of the left cavernous sinus mass, in keeping with the favored diagnosis of meningioma. 2.  Essentially negative intracranial MRA; hypoplastic right ACA A1 segment with an infundibulum at its origin.   Original Report Authenticated By: Randall An, M.D.    Ct Maxillofacial  W/cm  01/28/2012  *RADIOLOGY REPORT*  Clinical Data: Left-sided facial pain and fever.  Headache.  CT MAXILLOFACIAL WITH CONTRAST  Technique:  Multidetector CT imaging of the maxillofacial structures was performed with intravenous contrast. Multiplanar CT image reconstructions were also generated.  Contrast: 60m OMNIPAQUE IOHEXOL 300 MG/ML  SOLN  Comparison: Cervical spine radiographs 02/25/2007.  Findings: Visualization of some portions of the scan limited due to streak artifact arising from dental hardware.  Minimal infiltration in the subcutaneous fat over the left mandibular region is demonstrated.  No discrete fluid collection or contrast enhancement to suggest abscess.  Cervical lymph nodes are not pathologically enlarged.  Salivary glands appear symmetrical. No mucosal or prevertebral mass lesions appreciated.  Visualized jugular and carotid vessels appear patent.  No displacement of vessels or fat planes.  Visualized paranasal sinuses are not opacified.  Degenerative changes in the upper cervical spine. Degenerative changes in the temporomandibular joints bilaterally with joint space narrowing, deformity of the mandibular heads, and subcortical cystic changes.  There is an expansile lesion with central hypo density laterally to the left of the pituitary fossa.  This could represent a mass lesion or aneurysm arising in the region of Meckel's cave.  The lesion measures about 11 x 12 x 7 mm.  MRI correlation is recommended.  IMPRESSION: Mass lesion or possibly aneurysm arising to the left of the pituitary fossa.  MRI recommended for further evaluation.  Minimal soft tissue infiltration on the left without evidence of abscess. Degenerative changes in the cervical spine and temporomandibular joints.   Original Report Authenticated By: WNeale Burly M.D.     Assessment/Plan Patient with incidental finding on the MRI of the brain - Cavernous mass.  This mass is unlikely to be infectious in nature.  Patient is already on steroids and methotrexate and therefore ESR and CRP would not significantly help with the diagnosis. Patient is showing craniopathy ( Oculomotor , Trigeminal and Trochlear and  Abducens) which might be secondary to compressive Craniopathy. Less likely to be Trigeminal neuralgia as an isolated syndrome. [facility]   *There are studies that have shown that Hydroxyurea, Mifepristone and Tamoxifen have been used for Cavernous Meningioma but results were not significant in controlling symptoms of CSM   *Mason WP, GWanda PlumpDR, et al: Stabilization of disease progression by hydroxyurea in patients with recurrent or unresectable meningioma. J Neurosurg 968:032 122, 4825*GPeyton BottomsHJ, et al: A phase II evaluation of  tamoxifen in unresectable or refractory meningiomas: a [employer] [facility] study. J Neurooncol 15:75 77, 1993   Recommenations 1) Plan per Neurosurgery. 2) No further Neurology recommendations.      Avonte Sensabaugh V-P Donnie Mesa., MD., Ph.D.,MS 01/28/2012 8:26 PM 01/28/2012, 8:26 PM

## 2012-01-28 NOTE — ED Provider Notes (Signed)
Patient will receive an MRI with IV contrast to delineate abnormality found on CT scan.  Disposition will be as per results.  Dot Lanes, MD 01/28/12 [phone]

## 2012-01-28 NOTE — ED Provider Notes (Signed)
Lab tech brought me trop which was +.  I went to eval the pt.   She still has pain.   Will start ntg, heparin and give asa.  Will consult cards for nonstemi.   Barbara Cower, MD 01/28/12 1007

## 2012-01-28 NOTE — ED Notes (Signed)
Pt. States, "I am feeling much better".  Chest pain has decreased and pain behind the eyes have decreased.   Family at the bedside.  Pt. Is resting.

## 2012-01-28 NOTE — H&P (Signed)
Rasheedah E Wallie Char is an 71 y.o. female.    PCP: Maximino Greenland, MD   Chief Complaint: Facial pain, headache, and chest pain  HPI: This is a 71 year old, African American female, who is obese, who has a history of rheumatoid arthritis, hypertension, diabetes, gout, who is somewhat debilitated due to her arthritis and who was in her usual state of health till yesterday when she started having pain and burning sensation in the left eye and behind the left eye and the left nose area. The left face area and left-sided headache. The pain was quite intense and became worse to become 10 out of 10 in intensity. The pain started radiating down her left side of the neck. And, then subsequently, she had the pressure-like chest pain in the center of her chest. The pain was 10 out of 10 in intensity and the persisted through the night and when she was given nitroglycerin in the ED, the pain has decreased to 2/10. This pain in the chest, somewhat radiated to the rib cage in to the back. She had little bit of shortness of breath. She has leg swelling on and off. She had  some nausea, vomiting, today, and, then she decided to come in for further evaluation. She tried taking Claritin-D yesterday because she thought her face pain might have been related to sinusitis. However, she did not feel any better. Denies any syncopal episode. Denies any palpitations. Denies any visual changes. She reports a stress test many years ago and had a cardiac catheterization about 10 years ago, done by Dr. Tamala Julian with [facility] cardiology.   Home Medications: Prior to Admission medications   Medication Sig Start Date End Date Taking? Authorizing Provider  acetaminophen (TYLENOL) 500 MG tablet Take 500 mg by mouth 2 (two) times daily. Takes with Tramadol doses Scheduled doses   Yes Historical Provider, MD  atorvastatin (LIPITOR) 20 MG tablet Take 20 mg by mouth daily.   Yes Historical Provider, MD  hydroxychloroquine (PLAQUENIL) 200 MG  tablet Take 200 mg by mouth 2 (two) times daily.    Yes Historical Provider, MD  levothyroxine (SYNTHROID, LEVOTHROID) 50 MCG tablet Take 50 mcg by mouth daily.   Yes Historical Provider, MD  methotrexate (RHEUMATREX) 2.5 MG tablet Take 7.5 mg by mouth once a week. Usually on Thursday   Yes Historical Provider, MD  metoprolol succinate (TOPROL-XL) 100 MG 24 hr tablet Take 100 mg by mouth daily. Take with or immediately following a meal.   Yes Historical Provider, MD  olmesartan (BENICAR) 40 MG tablet Take 40 mg by mouth daily.   Yes Historical Provider, MD  predniSONE (DELTASONE) 5 MG tablet Take 5 mg by mouth daily.   Yes Historical Provider, MD  spironolactone (ALDACTONE) 25 MG tablet Take 25 mg by mouth.   Yes Historical Provider, MD  tocilizumab (ACTEMRA) 400 MG/20ML SOLN Inject 400 mg into the vein every 30 (thirty) days. Dose:  88m/kg  (4029m20ml)   Yes Historical Provider, MD  traMADol (UVeatrice BourbonR) 100 MG 24 hr tablet Take 100 mg by mouth 2 (two) times daily. Pain Scheduled doses   Yes Historical Provider, MD    Allergies:  Allergies  Allergen Reactions  . Codeine Swelling    Facial swelling  . Remicade (Infliximab)     "sent me into shock"  . Zestril (Lisinopril) Swelling    Face and neck swelling    Past Medical History: Past Medical History  Diagnosis Date  . Diabetes mellitus   . Hypertension   .  Thyroid disease   . Arthritis   . High cholesterol     Past Surgical History  Procedure Date  . Abdominal hysterectomy   . Ovary surgery   . Shoulder surgery     Social History:  reports that she has never smoked. She does not have any smokeless tobacco history on file. She reports that she does not drink alcohol or use illicit drugs.  Family History:  Family History  Problem Relation Age of Onset  . Diabetes Mother   . Hypertension Father   . Diabetes Sister   . Diabetes Brother   . Hypertension Brother   . Heart attack Mother   . Lung cancer Father      Review of Systems - History obtained from the patient General ROS: positive for  - fatigue Psychological ROS: positive for - anxiety Ophthalmic ROS: as in hpi ENT ROS: as in hpi Allergy and Immunology ROS: negative Hematological and Lymphatic ROS: negative Endocrine ROS: negative Respiratory ROS: no cough, shortness of breath, or wheezing Cardiovascular ROS: as in hpi Gastrointestinal ROS: no abdominal pain, change in bowel habits, or black or bloody stools Genito-Urinary ROS: no dysuria, trouble voiding, or hematuria Musculoskeletal ROS: negative Neurological ROS: no TIA or stroke symptoms Dermatological ROS: negative  Physical Examination Blood pressure 114/61, pulse 71, temperature 98.1 F (36.7 C), temperature source Oral, resp. rate 15, SpO2 93.00%.  General appearance: alert, cooperative, appears stated age, no distress and moderately obese Head: Normocephalic, without obvious abnormality, atraumatic Eyes: conjunctivae/corneas clear. PERRL, EOM's intact.  Throat: lips, mucosa, and tongue normal; teeth and gums normal No frontal or maxillary sinus tenderness Neck: no adenopathy, no carotid bruit, no JVD, supple, symmetrical, trachea midline and thyroid not enlarged, symmetric, no tenderness/mass/nodules Resp: clear to auscultation bilaterally Chest wall: tenderness over center of chest Cardio: regular rate and rhythm, S1, S2 normal, no murmur, click, rub or gallop GI: soft, non-tender; bowel sounds normal; no masses,  no organomegaly Extremities: extremities normal, atraumatic, no cyanosis or edema Pulses: 2+ and symmetric Skin: Skin color, texture, turgor normal. No rashes or lesions Lymph nodes: Cervical, supraclavicular, and axillary nodes normal. Neurologic: And she is alert and oriented x3. Cranial nerves are intact. No focal deficits are present.  Laboratory Data: Results for orders placed during the hospital encounter of 01/28/12 (from the past 48 hour(s))  CBC  WITH DIFFERENTIAL     Status: Abnormal   Collection Time   01/27/12 10:25 PM      Component Value Range Comment   WBC 5.3  4.0 - 10.5 K/uL    RBC 4.36  3.87 - 5.11 MIL/uL    Hemoglobin 13.2  12.0 - 15.0 g/dL    HCT 40.8  36.0 - 46.0 %    MCV 93.6  78.0 - 100.0 fL    MCH 30.3  26.0 - 34.0 pg    MCHC 32.4  30.0 - 36.0 g/dL    RDW 15.2  11.5 - 15.5 %    Platelets 156  150 - 400 K/uL    Neutrophils Relative 60  43 - 77 %    Neutro Abs 3.2  1.7 - 7.7 K/uL    Lymphocytes Relative 27  12 - 46 %    Lymphs Abs 1.4  0.7 - 4.0 K/uL    Monocytes Relative 7  3 - 12 %    Monocytes Absolute 0.4  0.1 - 1.0 K/uL    Eosinophils Relative 7 (*) 0 - 5 %    Eosinophils  Absolute 0.4  0.0 - 0.7 K/uL    Basophils Relative 0  0 - 1 %    Basophils Absolute 0.0  0.0 - 0.1 K/uL   COMPREHENSIVE METABOLIC PANEL     Status: Abnormal   Collection Time   01/27/12 10:25 PM      Component Value Range Comment   Sodium 136  135 - 145 mEq/L    Potassium 3.9  3.5 - 5.1 mEq/L    Chloride 98  96 - 112 mEq/L    CO2 32  19 - 32 mEq/L    Glucose, Bld 121 (*) 70 - 99 mg/dL    BUN 19  6 - 23 mg/dL    Creatinine, Ser 1.09  0.50 - 1.10 mg/dL    Calcium 9.4  8.4 - 10.5 mg/dL    Total Protein 6.9  6.0 - 8.3 g/dL    Albumin 3.8  3.5 - 5.2 g/dL    AST 16  0 - 37 U/L    ALT 24  0 - 35 U/L    Alkaline Phosphatase 60  39 - 117 U/L    Total Bilirubin 1.1  0.3 - 1.2 mg/dL    GFR calc non Af Amer 50 (*) >90 mL/min    GFR calc Af Amer 58 (*) >90 mL/min   POCT I-STAT TROPONIN I     Status: Normal   Collection Time   01/27/12 11:41 PM      Component Value Range Comment   Troponin i, poc 0.05  0.00 - 0.08 ng/mL    Comment 3            POCT I-STAT TROPONIN I     Status: Abnormal   Collection Time   01/28/12  9:37 AM      Component Value Range Comment   Troponin i, poc 0.15 (*) 0.00 - 0.08 ng/mL    Comment NOTIFIED PHYSICIAN      Comment 3            TROPONIN I     Status: Normal   Collection Time   01/28/12 10:31 AM       Component Value Range Comment   Troponin I <0.30  <0.30 ng/mL   PROTIME-INR     Status: Normal   Collection Time   01/28/12  2:54 PM      Component Value Range Comment   Prothrombin Time 14.0  11.6 - 15.2 seconds    INR 1.06  0.00 - 1.49   APTT     Status: Normal   Collection Time   01/28/12  2:54 PM      Component Value Range Comment   aPTT 25  24 - 37 seconds   SEDIMENTATION RATE     Status: Normal   Collection Time   01/28/12  2:54 PM      Component Value Range Comment   Sed Rate 3  0 - 22 mm/hr   CK TOTAL AND CKMB     Status: Normal   Collection Time   01/28/12  4:11 PM      Component Value Range Comment   Total CK 87  7 - 177 U/L    CK, MB 1.2  0.3 - 4.0 ng/mL    Relative Index RELATIVE INDEX IS INVALID  0.0 - 2.5   TROPONIN I     Status: Normal   Collection Time   01/28/12  4:11 PM      Component Value Range Comment  Troponin I <0.30  <0.30 ng/mL     Radiology Reports: Dg Chest 2 View  01/27/2012  *RADIOLOGY REPORT*  Clinical Data: Chest pain.  Headache.  Vomiting.  CHEST - 2 VIEW  Comparison: 01/02/2010.  Findings: Shallow inspiration.  Mild cardiac enlargement with increased pulmonary vascularity suggesting mild vascular congestion.  Infiltration or atelectasis in the lung bases.  No blunting of costophrenic angles.  No pneumothorax.  Tortuous aorta. Degenerative changes in the spine.  IMPRESSION: Mild cardiac enlargement and pulmonary vascular congestion. Bilateral basilar atelectasis or infiltration.   Original Report Authenticated By: Neale Burly, M.D.    Mr [facility] Wo Contrast  01/28/2012  *RADIOLOGY REPORT*  Clinical Data:  71 year old female with left side facial pain, headache.  Left cavernous sinus region mass on face CT.  MRI HEAD WITHOUT AND WITH CONTRAST  Technique: Multiplanar, multiecho pulse sequences of the brain and surrounding structures were obtained according to standard protocol without and with intravenous contrast.  Contrast: 18m MULTIHANCE  GADOBENATE DIMEGLUMINE 529 MG/ML IV SOLN  Comparison: Face CT with contrast 01/28/2012.  Findings:  Normal cerebral volume.  No ventriculomegaly. No restricted diffusion to suggest acute infarction.  No acute intracranial hemorrhage identified.  No midline shift.  Pituitary appears within normal limits.  Negative cervicomedullary junction visualized cervical spine. Major intracranial vascular flow voids are preserved, and MRA findings are below.  Abnormality at the left cavernous sinus seen on the earlier comparison correspond to abnormal soft tissue infiltrating the left sinus and projecting slightly posteriorly off of the posterior clinoid process.  The mass shows mildly abnormal diffusion (series 411), decreased T2 signal (series 10 images 16 and 17), isointense to cortex T1 signal, postcontrast enhancement with a central nonenhancing portion (series 11 image 18) and associated dural thickening with dural tail tracking into the left middle cranial fossa (image 17).  The mass encompasses 13 x 22 x 12 mm (transverse by AP by CC) not including all of the lateral extending dural tail.  The left fifth nerve cisternal segment appears mildly deviated laterally.  There is dural thickening surrounding the left trigeminal ganglion at MMirant(series 12 image 16).  No abnormal enhancement of the ganglion itself, or the cisternal nerves.  There is cavernous sinus infiltration.  No thickening or enhancement of the left V2 segment.  No associated cerebral edema.  Hypoplastic left anterior clinoid process is noted, no definite orbital apex involvement by the mass.  Elsewhere, no other abnormal enhancement.  Scattered cerebral white matter and central pons T2 and FLAIR hyperintensity, nonspecific but most commonly related to small vessel disease.  Superimposed dilated perivascular spaces including in the deep gray matter nuclei.  No cortical encephalomalacia.  Negative cerebellum.  Orbit soft tissues are within normal  limits. Visualized paranasal sinuses and mastoids are clear.  Negative scalp soft tissues.  IMPRESSION:  1.  Infiltrative lesion of the left cavernous sinus with posterior extension off the posterior clinoid process as seen on the comparison CT.  Associated dural thickening and dural tail extending in the left middle cranial fossa. 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 Malignant tumor such as lymphoma is considered but felt less likely. 2.   Left cavernous sinus infiltration by the mass and dural thickening surrounding the left gasserian ganglion.  No definite left orbital apex involvement. 3.  No other acute intracranial abnormality.  Nonspecific signal changes in the brain, favor due to  chronic small vessel disease. 4.  MRA findings are below.  MRA HEAD WITHOUT CONTRAST  Technique: Angiographic images of the Circle of Willis were obtained using MRA technique without  intravenous contrast.  Findings:  Artifact at the cervicomedullary junction. Antegrade flow in the posterior circulation with codominant distal vertebral arteries.  The no distal vertebral stenosis.  Normal vertebrobasilar junction.  No basilar stenosis.  Mild basilar tortuosity.  AICA vessel origins are patent.  SCA and PCA origins are patent.  Fetal type left PCA.  Bilateral PCA branches are within normal limits.  Left posterior communicating artery diminutive or absent.  Antegrade flow in both ICA siphons.  No ICA stenosis.  In the left cavernous ICA and left ICA terminus there is no flow signal in the region of the soft tissue mass (series 9 image 67).  Normal bilateral MCA origins.  Normal left ACA origin.  Hypoplastic right ACA A1 segment with an infundibulum at its origin.  Anterior communicating artery within normal limits.  Visualized ACA branches within normal limits.  Visualized bilateral MCA branches within normal limits.  IMPRESSION: 1.  No  flow signal in the region of the left cavernous sinus mass, in keeping with the favored diagnosis of meningioma. 2.  Essentially negative intracranial MRA; hypoplastic right ACA A1 segment with an infundibulum at its origin.   Original Report Authenticated By: Randall An, M.D.    Mr [name] Wo Contrast  01/28/2012  *RADIOLOGY REPORT*  Clinical Data:  71 year old female with left side facial pain, headache.  Left cavernous sinus region mass on face CT.  MRI HEAD WITHOUT AND WITH CONTRAST  Technique: Multiplanar, multiecho pulse sequences of the brain and surrounding structures were obtained according to standard protocol without and with intravenous contrast.  Contrast: 91m MULTIHANCE GADOBENATE DIMEGLUMINE 529 MG/ML IV SOLN  Comparison: Face CT with contrast 01/28/2012.  Findings:  Normal cerebral volume.  No ventriculomegaly. No restricted diffusion to suggest acute infarction.  No acute intracranial hemorrhage identified.  No midline shift.  Pituitary appears within normal limits.  Negative cervicomedullary junction visualized cervical spine. Major intracranial vascular flow voids are preserved, and MRA findings are below.  Abnormality at the left cavernous sinus seen on the earlier comparison correspond to abnormal soft tissue infiltrating the left sinus and projecting slightly posteriorly off of the posterior clinoid process.  The mass shows mildly abnormal diffusion (series 411), decreased T2 signal (series 10 images 16 and 17), isointense to cortex T1 signal, postcontrast enhancement with a central nonenhancing portion (series 11 image 18) and associated dural thickening with dural tail tracking into the left middle cranial fossa (image 17).  The mass encompasses 13 x 22 x 12 mm (transverse by AP by CC) not including all of the lateral extending dural tail.  The left fifth nerve cisternal segment appears mildly deviated laterally.  There is dural thickening surrounding the left trigeminal ganglion at  MMirant(series 12 image 16).  No abnormal enhancement of the ganglion itself, or the cisternal nerves.  There is cavernous sinus infiltration.  No thickening or enhancement of the left V2 segment.  No associated cerebral edema.  Hypoplastic left anterior clinoid process is noted, no definite orbital apex involvement by the mass.  Elsewhere, no other abnormal enhancement.  Scattered cerebral white matter and central pons T2 and FLAIR hyperintensity, nonspecific but most commonly related to small vessel disease.  Superimposed dilated perivascular spaces including in the deep gray matter nuclei.  No cortical encephalomalacia.  Negative cerebellum.  Orbit soft tissues are within normal limits. Visualized paranasal sinuses and mastoids are clear.  Negative scalp soft tissues.  IMPRESSION:  1.  Infiltrative lesion of the left cavernous sinus with posterior extension off the posterior clinoid process as seen on the comparison CT.  Associated dural thickening and dural tail extending in the left middle cranial fossa. 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 Malignant tumor such as lymphoma is considered but felt less likely. 2.   Left cavernous sinus infiltration by the mass and dural thickening surrounding the left gasserian ganglion.  No definite left orbital apex involvement. 3.  No other acute intracranial abnormality.  Nonspecific signal changes in the brain, favor due to chronic small vessel disease. 4.  MRA findings are below.  MRA HEAD WITHOUT CONTRAST  Technique: Angiographic images of the Circle of Willis were obtained using MRA technique without  intravenous contrast.  Findings:  Artifact at the cervicomedullary junction. Antegrade flow in the posterior circulation with codominant distal vertebral arteries.  The no distal vertebral stenosis.  Normal vertebrobasilar junction.  No basilar stenosis.  Mild basilar  tortuosity.  AICA vessel origins are patent.  SCA and PCA origins are patent.  Fetal type left PCA.  Bilateral PCA branches are within normal limits.  Left posterior communicating artery diminutive or absent.  Antegrade flow in both ICA siphons.  No ICA stenosis.  In the left cavernous ICA and left ICA terminus there is no flow signal in the region of the soft tissue mass (series 9 image 67).  Normal bilateral MCA origins.  Normal left ACA origin.  Hypoplastic right ACA A1 segment with an infundibulum at its origin.  Anterior communicating artery within normal limits.  Visualized ACA branches within normal limits.  Visualized bilateral MCA branches within normal limits.  IMPRESSION: 1.  No flow signal in the region of the left cavernous sinus mass, in keeping with the favored diagnosis of meningioma. 2.  Essentially negative intracranial MRA; hypoplastic right ACA A1 segment with an infundibulum at its origin.   Original Report Authenticated By: Randall An, M.D.    Ct Maxillofacial W/cm  01/28/2012  *RADIOLOGY REPORT*  Clinical Data: Left-sided facial pain and fever.  Headache.  CT MAXILLOFACIAL WITH CONTRAST  Technique:  Multidetector CT imaging of the maxillofacial structures was performed with intravenous contrast. Multiplanar CT image reconstructions were also generated.  Contrast: 23m OMNIPAQUE IOHEXOL 300 MG/ML  SOLN  Comparison: Cervical spine radiographs 02/25/2007.  Findings: Visualization of some portions of the scan limited due to streak artifact arising from dental hardware.  Minimal infiltration in the subcutaneous fat over the left mandibular region is demonstrated.  No discrete fluid collection or contrast enhancement to suggest abscess.  Cervical lymph nodes are not pathologically enlarged.  Salivary glands appear symmetrical. No mucosal or prevertebral mass lesions appreciated.  Visualized jugular and carotid vessels appear patent.  No displacement of vessels or fat planes.  Visualized  paranasal sinuses are not opacified.  Degenerative changes in the upper cervical spine. Degenerative changes in the temporomandibular joints bilaterally with joint space narrowing, deformity of the mandibular heads, and subcortical cystic changes.  There is an expansile lesion with central hypo density laterally to the left of the pituitary fossa.  This could represent a mass lesion or aneurysm arising in the region of Meckel's cave.  The lesion measures about 11 x 12 x 7 mm.  MRI correlation is recommended.  IMPRESSION: Mass lesion or possibly aneurysm arising to the left of the pituitary fossa.  MRI recommended for further evaluation.  Minimal soft tissue infiltration on the left without evidence of abscess. Degenerative changes in the cervical spine and temporomandibular joints.   Original Report Authenticated By: Neale Burly, M.D.     Electrocardiogram: Sinus rhythm at 73 beats per minute. Left axis deviation. No Q waves. No concerning ST or T-wave changes are noted. EKG was compared to one from 2005, and no significant changes are noted.  Assessment/Plan  Principal Problem:  *Brain mass Active Problems:  High cholesterol  Thyroid disease  Hypertension  Chest pain  Rheumatoid arthritis  Osteoarthritis   #1 mass in the cavernous sinus: Etiology remains unclear. Could be lymphoma or meningioma based on MRI findings. Patient has been seen by neurosurgery and they recommend steroids and Neurontin for, now. They feel that further evaluation may be necessary at a tertiary center. We will let Neurosurgery manage this issue. They did request a neurology consultation and that is pending at this time.   #2 chest pain: The presentation was somewhat concerning for coronary angina initially. However, now it appears to be atypical for cardiac etiology. EKG does not show any ischemic changes. The point-of-care troponin was mildly elevated. However, the subsequent troponins were all normal. She does  have a history of esophageal dysmotility and that could be playing a role. EKG will be repeated in the morning. Troponins will be cycled. Dr. Haroldine Laws will evaluate this patient as well. Aspirin will be given.  #3 history of rheumatoid arthritis and osteoarthritis: Physical and occupational therapy will be asked to see the patient. Continue with her disease modifying agents. She is on chronic steroids as well. Since she will be on Decadron for #1, we will hold the prednisone for now.  #4 history of hypertension: Continue to monitor blood pressures closely. Resume her oral antihypertensive agents.  #5 history of, diabetes, type II: Her home medication list did not have any oral agents. She does tell me that she takes some medication for diabetes, but however, was unable to name, it. We will put her on a sliding scale. We'll check HbA1c.  #6 history of hypothyroidism: Continue with Synthroid.  #7 morbid obesity: She'll be mobilized.  DVT, prophylaxis with SCDs.  She is a full code.  Further management decisions will depend on results of further testing and patient's response to treatment.  [facility]  [facility] Pager [phone]  01/28/2012, 5:52 PM

## 2012-01-29 ENCOUNTER — Inpatient Hospital Stay (HOSPITAL_COMMUNITY): Payer: Medicare Other

## 2012-01-29 DIAGNOSIS — R072 Precordial pain: Secondary | ICD-10-CM

## 2012-01-29 DIAGNOSIS — I10 Essential (primary) hypertension: Secondary | ICD-10-CM

## 2012-01-29 LAB — COMPREHENSIVE METABOLIC PANEL
AST: 16 U/L (ref 0–37)
Albumin: 3.6 g/dL (ref 3.5–5.2)
Chloride: 99 mEq/L (ref 96–112)
Creatinine, Ser: 1.08 mg/dL (ref 0.50–1.10)
Total Bilirubin: 1.1 mg/dL (ref 0.3–1.2)

## 2012-01-29 LAB — GLUCOSE, CAPILLARY
Glucose-Capillary: 139 mg/dL — ABNORMAL HIGH (ref 70–99)
Glucose-Capillary: 109 mg/dL — ABNORMAL HIGH (ref 70–99)
Glucose-Capillary: 149 mg/dL — ABNORMAL HIGH (ref 70–99)

## 2012-01-29 LAB — CBC
Hemoglobin: 13.4 g/dL (ref 12.0–15.0)
MCH: 31.2 pg (ref 26.0–34.0)
MCV: 92.3 fL (ref 78.0–100.0)
Platelets: 154 10*3/uL (ref 150–400)
RBC: 4.3 MIL/uL (ref 3.87–5.11)

## 2012-01-29 LAB — HEMOGLOBIN A1C
Hgb A1c MFr Bld: 5.6 % (ref <5.7)
Mean Plasma Glucose: 114 mg/dL (ref <117)

## 2012-01-29 LAB — MRSA PCR SCREENING: MRSA by PCR: NEGATIVE

## 2012-01-29 MED ORDER — SODIUM CHLORIDE 0.9 % IV SOLN: INTRAVENOUS | Status: DC

## 2012-01-29 MED ORDER — REGADENOSON 0.4 MG/5ML IV SOLN
0.4000 mg | Freq: Once | INTRAVENOUS | Status: AC
  Administered 2012-01-29: 0.4 mg via INTRAVENOUS
  Filled 2012-01-29: qty 5

## 2012-01-29 MED ORDER — TECHNETIUM TC 99M SESTAMIBI GENERIC - CARDIOLITE
30.0000 | Freq: Once | INTRAVENOUS | Status: AC | PRN
  Administered 2012-01-29: 30 via INTRAVENOUS

## 2012-01-29 NOTE — ED Provider Notes (Signed)
Medical screening examination/treatment/procedure(s) were conducted as a shared visit with non-physician practitioner(s) and myself.  I personally evaluated the patient during the encounter   Ezequiel Essex, MD 01/29/12 [phone]

## 2012-01-29 NOTE — Evaluation (Signed)
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    OT Assessment  Patient needs continued OT Services    Follow Up Recommendations  No OT follow up (until after mass is removed or shrunk)    Barriers to Discharge None    Equipment Recommendations  None recommended by OT       Frequency  Min 2X/week    Precautions / Restrictions Precautions Precautions: Fall (due to intermittent double vison) Restrictions Weight Bearing Restrictions: No   Pertinent Vitals/Pain 3/ 10 headache (mainly behind left eye)    ADL  Eating/Feeding: Simulated;Independent Where Assessed - Eating/Feeding: Edge of bed Grooming: Simulated;Set up Where Assessed - Grooming: Unsupported sitting Upper Body Bathing: Simulated;Set up Where Assessed - Upper Body Bathing: Unsupported sitting Lower Body Bathing: Simulated;Minimal assistance (feet) Where Assessed - Lower Body Bathing: Supported sit to stand Upper Body Dressing: Simulated;Set up Where Assessed - Upper Body Dressing: Unsupported sitting Lower Body Dressing: Simulated;Moderate assistance (socks and shoes (this was PTA as well)) Where Assessed - Lower Body Dressing: Supported sit to stand Toilet Transfer: Pharmacologist Method: Sit to Loss adjuster, chartered:  (Bed to door and back to recliner) [place] and Hygiene: Simulated;Independent Where Assessed - Toileting Clothing Manipulation and Hygiene: Standing Equipment Used: Gait belt (Uni-lateral  HHA) Transfers/Ambulation Related to ADLs: Min A for both    OT Diagnosis: Generalized weakness (impaired vision)  OT Problem List: Impaired vision/perception;Decreased strength OT Treatment Interventions: Visual/perceptual remediation/compensation;Therapeutic exercise;Patient/family education;Balance training   OT Goals Acute Rehab OT Goals OT Goal Formulation: With patient Time For Goal Achievement: 02/05/12 Potential to Achieve Goals: Good ADL Goals Pt Will Perform Grooming: with supervision;Standing at sink;Unsupported (2-3 tasks) ADL Goal: Grooming - Progress: Goal set today Pt Will Transfer to Toilet: with supervision;with DME;Comfort height toilet;Grab bars;Ambulation ADL Goal: Toilet Transfer - Progress: Goal set today Pt Will Perform Toileting - Clothing Manipulation: Independently;Standing ADL Goal: Toileting - Clothing Manipulation - Progress: Goal set today Pt Will Perform Toileting - Hygiene: Independently;Sit to stand from 3-in-1/toilet ADL Goal: Toileting - Hygiene - Progress: Goal set today Miscellaneous OT Goals Miscellaneous OT Goal #1: Pt will be independent with vision exercises. OT Goal: Miscellaneous Goal #1 - Progress: Goal set today Miscellaneous OT Goal #2: Pt should be able to I'ly report when she should be wearing the patch OT Goal: Miscellaneous Goal #2 - Progress: Goal set today  Visit Information  Last OT Received On: 01/29/12 Assistance Needed: +1    Subjective Data  Subjective: I just didn't feel well this morning when the PT came by---my head really hurt Patient Stated Goal: Get this tumor in my head taken care of   Prior Functioning  Vision/Perception  Home Living Lives With: Spouse Available Help at Discharge: Family;Available 24 hours/day Type of Home: House Home Access: Stairs to enter [employer] of Steps: 1 Entrance Stairs-Rails: None Home Layout: Two level (does not go downstairs) Bathroom Shower/Tub: Clinical cytogeneticist: Standard Home Adaptive Equipment: Bedside commode/3-in-1;Straight cane;Shower chair with back Prior Function Level of Independence: Needs assistance Needs Assistance: Bathing;Dressing;Meal Prep;Light Housekeeping  Bath: Minimal Dressing: Moderate Meal Prep: Maximal Light Housekeeping: Maximal Able to Take Stairs?: No Driving: Yes (A little---informed her to stop due to double vision) Vocation: Retired 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 difficulties Dominant Hand: Right   Vision - Assessment Eye Alignment: Impaired (comment) Vision Assessment: Vision tested Ocular Range of Motion: Within Functional Limits (just tends to see double with tracking) Alignment/Gaze Preference: Within Defined Limits Tracking/Visual Pursuits: Able to track stimulus in all quads without difficulty (however does tend to go back to midline at times from both d) Visual Fields: No apparent deficits Additional Comments: Mild to moderate ptosis of left eyelid. Have asked nursing to order a patch for pt to use only when se really needs it (ie: up ambulating, reading, watching TV, other times when she really needs to be sure what she is looking at v. seeing double of it---she verbalized understanding  Cognition  Overall Cognitive Status: Appears within functional limits for tasks assessed/performed Arousal/Alertness: Awake/alert Orientation Level: Appears intact for tasks assessed Behavior During Session: [facility] for tasks performed    Extremity/Trunk Assessment Right Upper Extremity Assessment RUE ROM/Strength/Tone: Within functional levels Left Upper Extremity Assessment LUE ROM/Strength/Tone: Within functional levels   Mobility  Shoulder Instructions  Bed Mobility Bed Mobility: Rolling Left;Left Sidelying to Sit;Supine to Sit Rolling Left: 6: Modified independent (Device/Increase time);With rail Left Sidelying to Sit: 6: Modified independent (Device/Increase time);With rails;HOB  flat Transfers Transfers: Sit to Stand;Stand to Sit Sit to Stand: 4: Min assist;From bed;With upper extremity assist Stand to Sit: 4: Min assist;With upper extremity assist;With armrests;To chair/3-in-1             End of Session OT - End of Session Equipment Utilized During Treatment: Gait belt Activity Tolerance: Patient tolerated treatment well Patient left: in chair;with call bell/phone within reach;with family/visitor present (husband) Nurse Communication: Mobility status       Almon Register [phone] 01/29/2012, 4:01 PM

## 2012-01-29 NOTE — Progress Notes (Signed)
Patient Name: [name] Date of Encounter: 01/29/2012  Principal Problem:  *Brain mass Active Problems:  High cholesterol  Thyroid disease  Hypertension  Chest pain  Rheumatoid arthritis  Osteoarthritis    SUBJECTIVE: C/o 3/10 pain from her chest, all the way up to her head, including a HA. Continuous for many hours, not sure exactly how long.  OBJECTIVE Filed Vitals:   01/29/12 0600 01/29/12 0700 01/29/12 0800 01/29/12 0900  BP: 125/71 127/70 126/70 127/71  Pulse:   85   Temp:   98.6 F (37 C)   TempSrc:   Oral   Resp: _0 Height:      Weight:      SpO2:   96%     Intake/Output Summary (Last 24 hours) at 01/29/12 0953 Last data filed at 01/29/12 0900  Gross per 24 hour  Intake    280 ml  Output   1000 ml  Net   -720 ml   Filed Weights   01/28/12 2048  Weight: 253 lb 15.5 oz (115.2 kg)     PHYSICAL EXAM General: Well developed, well nourished, female in no acute distress. Head: Normocephalic, atraumatic.  Neck: Supple without bruits, JVD not elevated. Lungs:  Resp regular and unlabored, basilar rales bilaterally. Heart: RRR, S1, S2, no S3, S4, or murmur. Abdomen: Soft, non-tender, non-distended, BS + x 4.  Extremities: No clubbing, cyanosis, no edema.  Neuro: Alert and oriented X 3. Moves all extremities spontaneously. Psych: Normal affect.  LABS: CBC: Basename 01/29/12 0253 01/27/12 2225  WBC 4.0 5.3  NEUTROABS -- 3.2  HGB 13.4 13.2  HCT 39.7 40.8  MCV 92.3 93.6  PLT 154 156   INR: Basename 01/28/12 1454  INR 1.21   Basic Metabolic Panel: Basename 62/44/69 0253 01/27/12 2225  NA 136 136  K 4.4 3.9  CL 99 98  CO2 29 32  GLUCOSE 149* 121*  BUN 15 19  CREATININE 1.08 1.09  CALCIUM 9.3 9.4  MG -- --  PHOS -- --   Liver Function Tests: Basename 01/29/12 0253 01/27/12 2225  AST 16 16  ALT 22 24  ALKPHOS 60 60  BILITOT 1.1 1.1  PROT 6.7 6.9  ALBUMIN 3.6 3.8   Cardiac Enzymes: Basename 01/29/12 0252 01/28/12 2132  01/28/12 1611  CKTOTAL -- -- 87  CKMB -- -- 1.2  CKMBINDEX -- -- --  TROPONINI <0.30 <0.30 <0.30    Basename 01/28/12 0937 01/27/12 2341  TROPIPOC 0.15* 0.05   Hemoglobin A1C: Basename 01/28/12 2131  HGBA1C 5.6   Thyroid Function Tests: Basename 01/28/12 2131  TSH 3.104  T4TOTAL --  T3FREE --  THYROIDAB --   TELE:        ECG: SR  Radiology/Studies: Dg Chest 2 View 01/27/2012  *RADIOLOGY REPORT*  Clinical Data: Chest pain.  Headache.  Vomiting.  CHEST - 2 VIEW  Comparison: 01/02/2010.  Findings: Shallow inspiration.  Mild cardiac enlargement with increased pulmonary vascularity suggesting mild vascular congestion.  Infiltration or atelectasis in the lung bases.  No blunting of costophrenic angles.  No pneumothorax.  Tortuous aorta. Degenerative changes in the spine.  IMPRESSION: Mild cardiac enlargement and pulmonary vascular congestion. Bilateral basilar atelectasis or infiltration.   Original Report Authenticated By: Neale Burly, M.D.    Mr [facility] Wo Contrast 01/28/2012  *RADIOLOGY REPORT*  Clinical Data:  71 year old female with left side facial pain, headache.  Left cavernous sinus region mass on face CT.  MRI HEAD WITHOUT AND  WITH CONTRAST  Technique: Multiplanar, multiecho pulse sequences of the brain and surrounding structures were obtained according to standard protocol without and with intravenous contrast.  Contrast: 47m MULTIHANCE GADOBENATE DIMEGLUMINE 529 MG/ML IV SOLN  Comparison: Face CT with contrast 01/28/2012.  Findings:  Normal cerebral volume.  No ventriculomegaly. No restricted diffusion to suggest acute infarction.  No acute intracranial hemorrhage identified.  No midline shift.  Pituitary appears within normal limits.  Negative cervicomedullary junction visualized cervical spine. Major intracranial vascular flow voids are preserved, and MRA findings are below.  Abnormality at the left cavernous sinus seen on the earlier comparison correspond to abnormal  soft tissue infiltrating the left sinus and projecting slightly posteriorly off of the posterior clinoid process.  The mass shows mildly abnormal diffusion (series 411), decreased T2 signal (series 10 images 16 and 17), isointense to cortex T1 signal, postcontrast enhancement with a central nonenhancing portion (series 11 image 18) and associated dural thickening with dural tail tracking into the left middle cranial fossa (image 17).  The mass encompasses 13 x 22 x 12 mm (transverse by AP by CC) not including all of the lateral extending dural tail.  The left fifth nerve cisternal segment appears mildly deviated laterally.  There is dural thickening surrounding the left trigeminal ganglion at MMirant(series 12 image 16).  No abnormal enhancement of the ganglion itself, or the cisternal nerves.  There is cavernous sinus infiltration.  No thickening or enhancement of the left V2 segment.  No associated cerebral edema.  Hypoplastic left anterior clinoid process is noted, no definite orbital apex involvement by the mass.  Elsewhere, no other abnormal enhancement.  Scattered cerebral white matter and central pons T2 and FLAIR hyperintensity, nonspecific but most commonly related to small vessel disease.  Superimposed dilated perivascular spaces including in the deep gray matter nuclei.  No cortical encephalomalacia.  Negative cerebellum.  Orbit soft tissues are within normal limits. Visualized paranasal sinuses and mastoids are clear.  Negative scalp soft tissues.  IMPRESSION:  1.  Infiltrative lesion of the left cavernous sinus with posterior extension off the posterior clinoid process as seen on the comparison CT.  Associated dural thickening and dural tail extending in the left middle cranial fossa. 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 Malignant tumor such as lymphoma is considered but felt less likely. 2.   Left  cavernous sinus infiltration by the mass and dural thickening surrounding the left gasserian ganglion.  No definite left orbital apex involvement. 3.  No other acute intracranial abnormality.  Nonspecific signal changes in the brain, favor due to chronic small vessel disease. 4.  MRA findings are below.    MRA HEAD WITHOUT CONTRAST  Technique: Angiographic images of the Circle of Willis were obtained using MRA technique without  intravenous contrast.  Findings:  Artifact at the cervicomedullary junction. Antegrade flow in the posterior circulation with codominant distal vertebral arteries.  The no distal vertebral stenosis.  Normal vertebrobasilar junction.  No basilar stenosis.  Mild basilar tortuosity.  AICA vessel origins are patent.  SCA and PCA origins are patent.  Fetal type left PCA.  Bilateral PCA branches are within normal limits.  Left posterior communicating artery diminutive or absent.  Antegrade flow in both ICA siphons.  No ICA stenosis.  In the left cavernous ICA and left ICA terminus there is no flow signal in the region of the soft tissue mass (series 9  image 67).  Normal bilateral MCA origins.  Normal left ACA origin.  Hypoplastic right ACA A1 segment with an infundibulum at its origin.  Anterior communicating artery within normal limits.  Visualized ACA branches within normal limits.  Visualized bilateral MCA branches within normal limits.  IMPRESSION: 1.  No flow signal in the region of the left cavernous sinus mass, in keeping with the favored diagnosis of meningioma. 2.  Essentially negative intracranial MRA; hypoplastic right ACA A1 segment with an infundibulum at its origin.   Original Report Authenticated By: Randall An, M.D.    Ct Maxillofacial W/cm 01/28/2012  *RADIOLOGY REPORT*  Clinical Data: Left-sided facial pain and fever.  Headache.  CT MAXILLOFACIAL WITH CONTRAST  Technique:  Multidetector CT imaging of the maxillofacial structures was performed with intravenous contrast.  Multiplanar CT image reconstructions were also generated.  Contrast: 106m OMNIPAQUE IOHEXOL 300 MG/ML  SOLN  Comparison: Cervical spine radiographs 02/25/2007.  Findings: Visualization of some portions of the scan limited due to streak artifact arising from dental hardware.  Minimal infiltration in the subcutaneous fat over the left mandibular region is demonstrated.  No discrete fluid collection or contrast enhancement to suggest abscess.  Cervical lymph nodes are not pathologically enlarged.  Salivary glands appear symmetrical. No mucosal or prevertebral mass lesions appreciated.  Visualized jugular and carotid vessels appear patent.  No displacement of vessels or fat planes.  Visualized paranasal sinuses are not opacified.  Degenerative changes in the upper cervical spine. Degenerative changes in the temporomandibular joints bilaterally with joint space narrowing, deformity of the mandibular heads, and subcortical cystic changes.  There is an expansile lesion with central hypo density laterally to the left of the pituitary fossa.  This could represent a mass lesion or aneurysm arising in the region of Meckel's cave.  The lesion measures about 11 x 12 x 7 mm.  MRI correlation is recommended.  IMPRESSION: Mass lesion or possibly aneurysm arising to the left of the pituitary fossa.  MRI recommended for further evaluation.  Minimal soft tissue infiltration on the left without evidence of abscess. Degenerative changes in the cervical spine and temporomandibular joints.   Original Report Authenticated By: WNeale Burly M.D.     Current Medications:     . sodium chloride  1,000 mL Intravenous Once  . aspirin  324 mg Oral Once  . aspirin EC  81 mg Oral Daily  . atorvastatin  20 mg Oral q1800  . dexamethasone  10 mg Intravenous Q6H  . docusate sodium  100 mg Oral BID  . gabapentin  300 mg Oral BID  . hydroxychloroquine  200 mg Oral BID  . influenza  inactive virus vaccine  0.5 mL Intramuscular  Tomorrow-1000  . insulin aspart  0-15 Units Subcutaneous TID WC  . irbesartan  300 mg Oral Daily  . levothyroxine  50 mcg Oral Daily  . methotrexate  7.5 mg Oral Weekly  . metoprolol succinate  100 mg Oral Daily  .  morphine injection  4 mg Intravenous Once  .  morphine injection  4 mg Intravenous Once  . multivitamin with minerals  1 tablet Oral Daily  . nitroGLYCERIN  2-200 mcg/min Intravenous Once  . regadenoson  0.4 mg Intravenous Once  . spironolactone  25 mg Oral Daily  . traMADol  50 mg Oral Q6H   . sodium chloride    . sodium chloride      ASSESSMENT AND PLAN:  Chest pain - Ez negative for MI and ECG without  acute changes despite hours of pain. OK to do MV (2-day study) which will finish tomorrow, to assess for ischemia.  Otherwise, per primary MD/NS Principal Problem:  *Brain mass Active Problems:  High cholesterol  Thyroid disease  Hypertension  Rheumatoid arthritis  Osteoarthritis  Signed, Rosaria Ferries , PA-C 9:53 AM 01/29/2012   I have seen, examined the patient, and reviewed the above assessment and plan.  Changes to above are made where necessary.  Pt with mild chest heaviness, worse with chest wall palpation.  CMs are negative x 3 and ekg is unchanged. She has had part 1 of 2 of myoview. OK to transfer to telemetry.  If myoview is low risk, would anticipate discharge tomorrow.  If high risk then further testing may become necessary.  Co Sign: Thompson Grayer, MD 01/29/2012 11:22 AM

## 2012-01-29 NOTE — Progress Notes (Signed)
PT Cancellation Note  Treatment cancelled today due to patient's refusal to participate.  Pt with increased pain this am, and requests trying tomorrow for PT evaluation.  Will follow as able.  Cylinda Santoli M 01/29/2012, 9:09 AM  01/29/2012 Cyndia Bent, PT, DPT [phone]

## 2012-01-29 NOTE — Progress Notes (Signed)
Lexiscan MV - stress portion - performed. 2-day study, will complete tomorrow.

## 2012-01-29 NOTE — Progress Notes (Signed)
[facility] PROGRESS NOTE  [name] EXH:[id number] DOB: 02/22/1941 DOA: 01/28/2012 PCP: Maximino Greenland, MD  Assessment/Plan: Principal Problem: Cavernous Sinus tumor Highly suspicious of meningioma on imaging- have discussed with neurology and they feel that an LP will be of low yield and ESR and CPK would be altered due to h/o rheumatoid arthritis and current treatment - they have recommended a biopsy- have discussed need for biopsy with patient- pt states that she prefers to go to [facility] for further workup and treatment and will obtain a referral from her PCP. Facial pain is likely in relation to the tumor and is currently controlled with Tramadol. Cont Neurontin and steroids per neurosurgery.    Chest pain 2 day stress myoview in progress- if negative, may be discharged in AM.    Rheumatoid arthritis Cont current management    Thyroid disease Cont Synthroid   Hypertension Cont Toprol, ARB and Aldactone  Code Status: full code Family Communication: case discussed with patient Disposition Plan: transfer to telemetry DVT prophylaxis: ASA, SCDs   Brief narrative: This is a 71 year old, African American female, who is obese, who has a history of rheumatoid arthritis, hypertension, diabetes, gout, who is somewhat debilitated due to her arthritis and who was in her usual state of health till yesterday when she started having pain and burning sensation in the left eye and behind the left eye and the left nose area. The left face area and left-sided headache. The pain was quite intense and became worse to become 10 out of 10 in intensity. The pain started radiating down her left side of the neck. And, then subsequently, she had the pressure-like chest pain in the center of her chest. The pain was 10 out of 10 in intensity and the persisted through the night and when she was given nitroglycerin in the ED, the pain has decreased to 2/10. This pain in the chest, somewhat radiated to  the rib cage in to the back. She had little bit of shortness of breath. She has leg swelling on and off. She had some nausea, vomiting, today, and, then she decided to come in for further evaluation. She tried taking Claritin-D because she thought her face pain might have been related to sinusitis. However, she did not feel any better. Denies any syncopal episode. Denies any palpitations. Denies any visual changes. She reports a stress test many years ago and had a cardiac catheterization about 10 years ago, done by Dr. Tamala Julian with [facility] cardiology.   Consultants:  Cardiology- [facility]  Neurosurgery  Neurology  Procedures:  myoview stress test  Antibiotics:  none  HPI/Subjective: Patient currently notes pain in teeth and behind left eye but it is more tolerable than before. She feels the pain radiates down to her neck and upper chest.    Objective: Filed Vitals:   01/29/12 1400 01/29/12 1500 01/29/12 1542 01/29/12 1545  BP:    130/75  Pulse:      Temp:   98.5 F (36.9 C)   TempSrc:   Oral   Resp: 16 20    Height:      Weight:      SpO2:   95%     Intake/Output Summary (Last 24 hours) at 01/29/12 1656 Last data filed at 01/29/12 1300  Gross per 24 hour  Intake    520 ml  Output   1000 ml  Net   -480 ml    Exam:   General:  Alert, no distress  Cardiovascular: RRR, no  murmurs  Respiratory: CTA b/l   Abdomen: soft, NT, ND, BS+  Ext: no c/c/e  Data Reviewed: Basic Metabolic Panel:  Lab 54/62/70 0253 01/27/12 2225  NA 136 136  K 4.4 3.9  CL 99 98  CO2 29 32  GLUCOSE 149* 121*  BUN 15 19  CREATININE 1.08 1.09  CALCIUM 9.3 9.4  MG -- --  PHOS -- --   Liver Function Tests:  Lab 01/29/12 0253 01/27/12 2225  AST 16 16  ALT 22 24  ALKPHOS 60 60  BILITOT 1.1 1.1  PROT 6.7 6.9  ALBUMIN 3.6 3.8   No results found for this basename: LIPASE:5,AMYLASE:5 in the last 168 hours No results found for this basename: AMMONIA:5 in the last 168 hours CBC:  Lab  01/29/12 0253 01/27/12 2225  WBC 4.0 5.3  NEUTROABS -- 3.2  HGB 13.4 13.2  HCT 39.7 40.8  MCV 92.3 93.6  PLT 154 156   Cardiac Enzymes:  Lab 01/29/12 0923 01/29/12 0252 01/28/12 2132 01/28/12 1611 01/28/12 1031  CKTOTAL -- -- -- 87 --  CKMB -- -- -- 1.2 --  CKMBINDEX -- -- -- -- --  TROPONINI <0.30 <0.30 <0.30 <0.30 <0.30   BNP (last 3 results) No results found for this basename: PROBNP:3 in the last 8760 hours CBG:  Lab 01/29/12 1201 01/29/12 0840 01/28/12 2046  GLUCAP 109* 139* 95    Recent Results (from the past 240 hour(s))  MRSA PCR SCREENING     Status: Normal   Collection Time   01/28/12 11:49 PM      Component Value Range Status Comment   MRSA by PCR NEGATIVE  NEGATIVE Final      Studies: Dg Chest 2 View  01/27/2012  *RADIOLOGY REPORT*  Clinical Data: Chest pain.  Headache.  Vomiting.  CHEST - 2 VIEW  Comparison: 01/02/2010.  Findings: Shallow inspiration.  Mild cardiac enlargement with increased pulmonary vascularity suggesting mild vascular congestion.  Infiltration or atelectasis in the lung bases.  No blunting of costophrenic angles.  No pneumothorax.  Tortuous aorta. Degenerative changes in the spine.  IMPRESSION: Mild cardiac enlargement and pulmonary vascular congestion. Bilateral basilar atelectasis or infiltration.   Original Report Authenticated By: Neale Burly, M.D.    Mr [facility] Wo Contrast  01/28/2012  *RADIOLOGY REPORT*  Clinical Data:  71 year old female with left side facial pain, headache.  Left cavernous sinus region mass on face CT.  MRI HEAD WITHOUT AND WITH CONTRAST  Technique: Multiplanar, multiecho pulse sequences of the brain and surrounding structures were obtained according to standard protocol without and with intravenous contrast.  Contrast: 50m MULTIHANCE GADOBENATE DIMEGLUMINE 529 MG/ML IV SOLN  Comparison: Face CT with contrast 01/28/2012.  Findings:  Normal cerebral volume.  No ventriculomegaly. No restricted diffusion to suggest  acute infarction.  No acute intracranial hemorrhage identified.  No midline shift.  Pituitary appears within normal limits.  Negative cervicomedullary junction visualized cervical spine. Major intracranial vascular flow voids are preserved, and MRA findings are below.  Abnormality at the left cavernous sinus seen on the earlier comparison correspond to abnormal soft tissue infiltrating the left sinus and projecting slightly posteriorly off of the posterior clinoid process.  The mass shows mildly abnormal diffusion (series 411), decreased T2 signal (series 10 images 16 and 17), isointense to cortex T1 signal, postcontrast enhancement with a central nonenhancing portion (series 11 image 18) and associated dural thickening with dural tail tracking into the left middle cranial fossa (image 17).  The mass encompasses  13 x 22 x 12 mm (transverse by AP by CC) not including all of the lateral extending dural tail.  The left fifth nerve cisternal segment appears mildly deviated laterally.  There is dural thickening surrounding the left trigeminal ganglion at [employer] (series 12 image 16).  No abnormal enhancement of the ganglion itself, or the cisternal nerves.  There is cavernous sinus infiltration.  No thickening or enhancement of the left V2 segment.  No associated cerebral edema.  Hypoplastic left anterior clinoid process is noted, no definite orbital apex involvement by the mass.  Elsewhere, no other abnormal enhancement.  Scattered cerebral white matter and central pons T2 and FLAIR hyperintensity, nonspecific but most commonly related to small vessel disease.  Superimposed dilated perivascular spaces including in the deep gray matter nuclei.  No cortical encephalomalacia.  Negative cerebellum.  Orbit soft tissues are within normal limits. Visualized paranasal sinuses and mastoids are clear.  Negative scalp soft tissues.  IMPRESSION:  1.  Infiltrative lesion of the left cavernous sinus with posterior extension off  the posterior clinoid process as seen on the comparison CT.  Associated dural thickening and dural tail extending in the left middle cranial fossa. 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 Malignant tumor such as lymphoma is considered but felt less likely. 2.   Left cavernous sinus infiltration by the mass and dural thickening surrounding the left gasserian ganglion.  No definite left orbital apex involvement. 3.  No other acute intracranial abnormality.  Nonspecific signal changes in the brain, favor due to chronic small vessel disease. 4.  MRA findings are below.  MRA HEAD WITHOUT CONTRAST  Technique: Angiographic images of the Circle of Willis were obtained using MRA technique without  intravenous contrast.  Findings:  Artifact at the cervicomedullary junction. Antegrade flow in the posterior circulation with codominant distal vertebral arteries.  The no distal vertebral stenosis.  Normal vertebrobasilar junction.  No basilar stenosis.  Mild basilar tortuosity.  AICA vessel origins are patent.  SCA and PCA origins are patent.  Fetal type left PCA.  Bilateral PCA branches are within normal limits.  Left posterior communicating artery diminutive or absent.  Antegrade flow in both ICA siphons.  No ICA stenosis.  In the left cavernous ICA and left ICA terminus there is no flow signal in the region of the soft tissue mass (series 9 image 67).  Normal bilateral MCA origins.  Normal left ACA origin.  Hypoplastic right ACA A1 segment with an infundibulum at its origin.  Anterior communicating artery within normal limits.  Visualized ACA branches within normal limits.  Visualized bilateral MCA branches within normal limits.  IMPRESSION: 1.  No flow signal in the region of the left cavernous sinus mass, in keeping with the favored diagnosis of meningioma. 2.  Essentially negative intracranial MRA; hypoplastic right ACA A1  segment with an infundibulum at its origin.   Original Report Authenticated By: Randall An, M.D.    Mr [name] Wo Contrast  01/28/2012  *RADIOLOGY REPORT*  Clinical Data:  71 year old female with left side facial pain, headache.  Left cavernous sinus region mass on face CT.  MRI HEAD WITHOUT AND WITH CONTRAST  Technique: Multiplanar, multiecho pulse sequences of the brain and surrounding structures were obtained according to standard protocol without and with intravenous contrast.  Contrast: 75m MULTIHANCE GADOBENATE DIMEGLUMINE 529 MG/ML IV SOLN  Comparison: Face CT with contrast 01/28/2012.  Findings:  Normal cerebral volume.  No ventriculomegaly. No restricted diffusion to suggest acute infarction.  No acute intracranial hemorrhage identified.  No midline shift.  Pituitary appears within normal limits.  Negative cervicomedullary junction visualized cervical spine. Major intracranial vascular flow voids are preserved, and MRA findings are below.  Abnormality at the left cavernous sinus seen on the earlier comparison correspond to abnormal soft tissue infiltrating the left sinus and projecting slightly posteriorly off of the posterior clinoid process.  The mass shows mildly abnormal diffusion (series 411), decreased T2 signal (series 10 images 16 and 17), isointense to cortex T1 signal, postcontrast enhancement with a central nonenhancing portion (series 11 image 18) and associated dural thickening with dural tail tracking into the left middle cranial fossa (image 17).  The mass encompasses 13 x 22 x 12 mm (transverse by AP by CC) not including all of the lateral extending dural tail.  The left fifth nerve cisternal segment appears mildly deviated laterally.  There is dural thickening surrounding the left trigeminal ganglion at [employer] (series 12 image 16).  No abnormal enhancement of the ganglion itself, or the cisternal nerves.  There is cavernous sinus infiltration.  No thickening or enhancement of  the left V2 segment.  No associated cerebral edema.  Hypoplastic left anterior clinoid process is noted, no definite orbital apex involvement by the mass.  Elsewhere, no other abnormal enhancement.  Scattered cerebral white matter and central pons T2 and FLAIR hyperintensity, nonspecific but most commonly related to small vessel disease.  Superimposed dilated perivascular spaces including in the deep gray matter nuclei.  No cortical encephalomalacia.  Negative cerebellum.  Orbit soft tissues are within normal limits. Visualized paranasal sinuses and mastoids are clear.  Negative scalp soft tissues.  IMPRESSION:  1.  Infiltrative lesion of the left cavernous sinus with posterior extension off the posterior clinoid process as seen on the comparison CT.  Associated dural thickening and dural tail extending in the left middle cranial fossa. 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 Malignant tumor such as lymphoma is considered but felt less likely. 2.   Left cavernous sinus infiltration by the mass and dural thickening surrounding the left gasserian ganglion.  No definite left orbital apex involvement. 3.  No other acute intracranial abnormality.  Nonspecific signal changes in the brain, favor due to chronic small vessel disease. 4.  MRA findings are below.  MRA HEAD WITHOUT CONTRAST  Technique: Angiographic images of the Circle of Willis were obtained using MRA technique without  intravenous contrast.  Findings:  Artifact at the cervicomedullary junction. Antegrade flow in the posterior circulation with codominant distal vertebral arteries.  The no distal vertebral stenosis.  Normal vertebrobasilar junction.  No basilar stenosis.  Mild basilar tortuosity.  AICA vessel origins are patent.  SCA and PCA origins are patent.  Fetal type left PCA.  Bilateral PCA branches are within normal limits.  Left posterior communicating  artery diminutive or absent.  Antegrade flow in both ICA siphons.  No ICA stenosis.  In the left cavernous ICA and left ICA terminus there is no flow signal in the region of the soft tissue mass (series 9 image 67).  Normal bilateral MCA origins.  Normal left ACA origin.  Hypoplastic right ACA A1 segment with an infundibulum at its origin.  Anterior communicating artery within normal limits.  Visualized ACA branches within normal limits.  Visualized bilateral MCA branches within normal limits.  IMPRESSION: 1.  No flow signal in the region of the left cavernous sinus mass, in keeping with the favored diagnosis of meningioma. 2.  Essentially negative intracranial MRA; hypoplastic right ACA A1 segment with an infundibulum at its origin.   Original Report Authenticated By: Randall An, M.D.    Ct Maxillofacial W/cm  01/28/2012  *RADIOLOGY REPORT*  Clinical Data: Left-sided facial pain and fever.  Headache.  CT MAXILLOFACIAL WITH CONTRAST  Technique:  Multidetector CT imaging of the maxillofacial structures was performed with intravenous contrast. Multiplanar CT image reconstructions were also generated.  Contrast: 97m OMNIPAQUE IOHEXOL 300 MG/ML  SOLN  Comparison: Cervical spine radiographs 02/25/2007.  Findings: Visualization of some portions of the scan limited due to streak artifact arising from dental hardware.  Minimal infiltration in the subcutaneous fat over the left mandibular region is demonstrated.  No discrete fluid collection or contrast enhancement to suggest abscess.  Cervical lymph nodes are not pathologically enlarged.  Salivary glands appear symmetrical. No mucosal or prevertebral mass lesions appreciated.  Visualized jugular and carotid vessels appear patent.  No displacement of vessels or fat planes.  Visualized paranasal sinuses are not opacified.  Degenerative changes in the upper cervical spine. Degenerative changes in the temporomandibular joints bilaterally with joint space narrowing,  deformity of the mandibular heads, and subcortical cystic changes.  There is an expansile lesion with central hypo density laterally to the left of the pituitary fossa.  This could represent a mass lesion or aneurysm arising in the region of Meckel's cave.  The lesion measures about 11 x 12 x 7 mm.  MRI correlation is recommended.  IMPRESSION: Mass lesion or possibly aneurysm arising to the left of the pituitary fossa.  MRI recommended for further evaluation.  Minimal soft tissue infiltration on the left without evidence of abscess. Degenerative changes in the cervical spine and temporomandibular joints.   Original Report Authenticated By: WNeale Burly M.D.     Scheduled Meds:   . aspirin EC  81 mg Oral Daily  . atorvastatin  20 mg Oral q1800  . dexamethasone  10 mg Intravenous Q6H  . docusate sodium  100 mg Oral BID  . gabapentin  300 mg Oral BID  . hydroxychloroquine  200 mg Oral BID  . influenza  inactive virus vaccine  0.5 mL Intramuscular Tomorrow-1000  . insulin aspart  0-15 Units Subcutaneous TID WC  . irbesartan  300 mg Oral Daily  . levothyroxine  50 mcg Oral Daily  . methotrexate  7.5 mg Oral Weekly  . metoprolol succinate  100 mg Oral Daily  . multivitamin with minerals  1 tablet Oral Daily  . nitroGLYCERIN  2-200 mcg/min Intravenous Once  . regadenoson  0.4 mg Intravenous Once  . spironolactone  25 mg Oral Daily  . traMADol  50 mg Oral Q6H  . DISCONTD: methotrexate  7.5 mg Oral Weekly  . DISCONTD: methotrexate  7.5 mg Oral Weekly  . DISCONTD: traMADol  100 mg Oral BID   Continuous Infusions:   . sodium chloride    . sodium chloride    . DISCONTD: sodium chloride 1,000 mL (01/28/12 1913)    ________________________________________________________________________  Time spent: 391min    [facility] Pager [phone] 8PM-8AM, please contact night-coverage at [email or web address], password [email or web address]9/04/2012, 4:56 PM  LOS: 1 day

## 2012-01-29 NOTE — Care Management Note (Signed)
    Page 1 of 1   01/29/2012     9:25:01 AM   CARE MANAGEMENT NOTE 01/29/2012  Patient:  [name], [name]   Account Number:  [id number]  Date Initiated:  01/29/2012  Documentation initiated by:  Elissa Hefty  Subjective/Objective Assessment:   adm w chpain, headache     Action/Plan:   lives w husband, pcp dr Bailey Mech sanders   Anticipated DC Date:     Anticipated DC Plan:        DC Planning Services  CM consult      Choice offered to / List presented to:             Status of service:   Medicare Important Message given?   (If response is "NO", the following Medicare IM given date fields will be blank) Date Medicare IM given:   Date Additional Medicare IM given:    Discharge Disposition:    Per UR Regulation:  Reviewed for med. necessity/level of care/duration of stay  If discussed at [place] of Stay Meetings, dates discussed:    Comments:  9/12 9:24a debbie Joan Manfredo rn,bsn [phone]

## 2012-01-29 NOTE — Progress Notes (Signed)
Subjective: Patient reports She's feeling better the medicines that help her face pain  Objective: Vital signs in last 24 hours: Temp:  [97.7 F (36.5 C)-98.6 F (37 C)] 98.6 F (37 C) (09/12 0800) Pulse Rate:  [66-85] 85  (09/12 0800) Resp:  [10-22] 16  (09/12 0900) BP: (99-138)/(55-85) 127/71 mmHg (09/12 0900) SpO2:  [93 %-100 %] 96 % (09/12 0800) Weight:  [115.2 kg (253 lb 15.5 oz)] 115.2 kg (253 lb 15.5 oz) (09/11 2048)  Intake/Output from previous day: 09/11 0701 - 09/12 0700 In: 220 [P.O.:120; I.V.:100] Out: 600 [Urine:600] Intake/Output this shift:    She is awake alert pupils are equal extraocular movements appear to be intact although she complaint of double vision so must have a little bit of abducens nerve palsy  Lab Results:  Basename 01/29/12 0253 01/27/12 2225  WBC 4.0 5.3  HGB 13.4 13.2  HCT 39.7 40.8  PLT 154 156   BMET  Basename 01/29/12 0253 01/27/12 2225  NA 136 136  K 4.4 3.9  CL 99 98  CO2 29 32  GLUCOSE 149* 121*  BUN 15 19  CREATININE 1.08 1.09  CALCIUM 9.3 9.4    Studies/Results: Dg Chest 2 View  01/27/2012  *RADIOLOGY REPORT*  Clinical Data: Chest pain.  Headache.  Vomiting.  CHEST - 2 VIEW  Comparison: 01/02/2010.  Findings: Shallow inspiration.  Mild cardiac enlargement with increased pulmonary vascularity suggesting mild vascular congestion.  Infiltration or atelectasis in the lung bases.  No blunting of costophrenic angles.  No pneumothorax.  Tortuous aorta. Degenerative changes in the spine.  IMPRESSION: Mild cardiac enlargement and pulmonary vascular congestion. Bilateral basilar atelectasis or infiltration.   Original Report Authenticated By: Neale Burly, M.D.    Mr [facility] Wo Contrast  01/28/2012  *RADIOLOGY REPORT*  Clinical Data:  71 year old female with left side facial pain, headache.  Left cavernous sinus region mass on face CT.  MRI HEAD WITHOUT AND WITH CONTRAST  Technique: Multiplanar, multiecho pulse sequences of the  brain and surrounding structures were obtained according to standard protocol without and with intravenous contrast.  Contrast: 1m MULTIHANCE GADOBENATE DIMEGLUMINE 529 MG/ML IV SOLN  Comparison: Face CT with contrast 01/28/2012.  Findings:  Normal cerebral volume.  No ventriculomegaly. No restricted diffusion to suggest acute infarction.  No acute intracranial hemorrhage identified.  No midline shift.  Pituitary appears within normal limits.  Negative cervicomedullary junction visualized cervical spine. Major intracranial vascular flow voids are preserved, and MRA findings are below.  Abnormality at the left cavernous sinus seen on the earlier comparison correspond to abnormal soft tissue infiltrating the left sinus and projecting slightly posteriorly off of the posterior clinoid process.  The mass shows mildly abnormal diffusion (series 411), decreased T2 signal (series 10 images 16 and 17), isointense to cortex T1 signal, postcontrast enhancement with a central nonenhancing portion (series 11 image 18) and associated dural thickening with dural tail tracking into the left middle cranial fossa (image 17).  The mass encompasses 13 x 22 x 12 mm (transverse by AP by CC) not including all of the lateral extending dural tail.  The left fifth nerve cisternal segment appears mildly deviated laterally.  There is dural thickening surrounding the left trigeminal ganglion at [employer](series 12 image 16).  No abnormal enhancement of the ganglion itself, or the cisternal nerves.  There is cavernous sinus infiltration.  No thickening or enhancement of the left V2 segment.  No associated cerebral edema.  Hypoplastic left anterior clinoid process  is noted, no definite orbital apex involvement by the mass.  Elsewhere, no other abnormal enhancement.  Scattered cerebral white matter and central pons T2 and FLAIR hyperintensity, nonspecific but most commonly related to small vessel disease.  Superimposed dilated perivascular  spaces including in the deep gray matter nuclei.  No cortical encephalomalacia.  Negative cerebellum.  Orbit soft tissues are within normal limits. Visualized paranasal sinuses and mastoids are clear.  Negative scalp soft tissues.  IMPRESSION:  1.  Infiltrative lesion of the left cavernous sinus with posterior extension off the posterior clinoid process as seen on the comparison CT.  Associated dural thickening and dural tail extending in the left middle cranial fossa. 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 Malignant tumor such as lymphoma is considered but felt less likely. 2.   Left cavernous sinus infiltration by the mass and dural thickening surrounding the left gasserian ganglion.  No definite left orbital apex involvement. 3.  No other acute intracranial abnormality.  Nonspecific signal changes in the brain, favor due to chronic small vessel disease. 4.  MRA findings are below.  MRA HEAD WITHOUT CONTRAST  Technique: Angiographic images of the Circle of Willis were obtained using MRA technique without  intravenous contrast.  Findings:  Artifact at the cervicomedullary junction. Antegrade flow in the posterior circulation with codominant distal vertebral arteries.  The no distal vertebral stenosis.  Normal vertebrobasilar junction.  No basilar stenosis.  Mild basilar tortuosity.  AICA vessel origins are patent.  SCA and PCA origins are patent.  Fetal type left PCA.  Bilateral PCA branches are within normal limits.  Left posterior communicating artery diminutive or absent.  Antegrade flow in both ICA siphons.  No ICA stenosis.  In the left cavernous ICA and left ICA terminus there is no flow signal in the region of the soft tissue mass (series 9 image 67).  Normal bilateral MCA origins.  Normal left ACA origin.  Hypoplastic right ACA A1 segment with an infundibulum at its origin.  Anterior communicating artery within  normal limits.  Visualized ACA branches within normal limits.  Visualized bilateral MCA branches within normal limits.  IMPRESSION: 1.  No flow signal in the region of the left cavernous sinus mass, in keeping with the favored diagnosis of meningioma. 2.  Essentially negative intracranial MRA; hypoplastic right ACA A1 segment with an infundibulum at its origin.   Original Report Authenticated By: Randall An, M.D.    Mr [name] Wo Contrast  01/28/2012  *RADIOLOGY REPORT*  Clinical Data:  71 year old female with left side facial pain, headache.  Left cavernous sinus region mass on face CT.  MRI HEAD WITHOUT AND WITH CONTRAST  Technique: Multiplanar, multiecho pulse sequences of the brain and surrounding structures were obtained according to standard protocol without and with intravenous contrast.  Contrast: 73m MULTIHANCE GADOBENATE DIMEGLUMINE 529 MG/ML IV SOLN  Comparison: Face CT with contrast 01/28/2012.  Findings:  Normal cerebral volume.  No ventriculomegaly. No restricted diffusion to suggest acute infarction.  No acute intracranial hemorrhage identified.  No midline shift.  Pituitary appears within normal limits.  Negative cervicomedullary junction visualized cervical spine. Major intracranial vascular flow voids are preserved, and MRA findings are below.  Abnormality at the left cavernous sinus seen on the earlier comparison correspond to abnormal soft tissue infiltrating the left sinus and projecting slightly posteriorly off of the posterior clinoid process.  The mass shows mildly abnormal diffusion (  series 411), decreased T2 signal (series 10 images 16 and 17), isointense to cortex T1 signal, postcontrast enhancement with a central nonenhancing portion (series 11 image 18) and associated dural thickening with dural tail tracking into the left middle cranial fossa (image 17).  The mass encompasses 13 x 22 x 12 mm (transverse by AP by CC) not including all of the lateral extending dural tail.  The  left fifth nerve cisternal segment appears mildly deviated laterally.  There is dural thickening surrounding the left trigeminal ganglion at [employer] (series 12 image 16).  No abnormal enhancement of the ganglion itself, or the cisternal nerves.  There is cavernous sinus infiltration.  No thickening or enhancement of the left V2 segment.  No associated cerebral edema.  Hypoplastic left anterior clinoid process is noted, no definite orbital apex involvement by the mass.  Elsewhere, no other abnormal enhancement.  Scattered cerebral white matter and central pons T2 and FLAIR hyperintensity, nonspecific but most commonly related to small vessel disease.  Superimposed dilated perivascular spaces including in the deep gray matter nuclei.  No cortical encephalomalacia.  Negative cerebellum.  Orbit soft tissues are within normal limits. Visualized paranasal sinuses and mastoids are clear.  Negative scalp soft tissues.  IMPRESSION:  1.  Infiltrative lesion of the left cavernous sinus with posterior extension off the posterior clinoid process as seen on the comparison CT.  Associated dural thickening and dural tail extending in the left middle cranial fossa. 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 Malignant tumor such as lymphoma is considered but felt less likely. 2.   Left cavernous sinus infiltration by the mass and dural thickening surrounding the left gasserian ganglion.  No definite left orbital apex involvement. 3.  No other acute intracranial abnormality.  Nonspecific signal changes in the brain, favor due to chronic small vessel disease. 4.  MRA findings are below.  MRA HEAD WITHOUT CONTRAST  Technique: Angiographic images of the Circle of Willis were obtained using MRA technique without  intravenous contrast.  Findings:  Artifact at the cervicomedullary junction. Antegrade flow in the posterior circulation with  codominant distal vertebral arteries.  The no distal vertebral stenosis.  Normal vertebrobasilar junction.  No basilar stenosis.  Mild basilar tortuosity.  AICA vessel origins are patent.  SCA and PCA origins are patent.  Fetal type left PCA.  Bilateral PCA branches are within normal limits.  Left posterior communicating artery diminutive or absent.  Antegrade flow in both ICA siphons.  No ICA stenosis.  In the left cavernous ICA and left ICA terminus there is no flow signal in the region of the soft tissue mass (series 9 image 67).  Normal bilateral MCA origins.  Normal left ACA origin.  Hypoplastic right ACA A1 segment with an infundibulum at its origin.  Anterior communicating artery within normal limits.  Visualized ACA branches within normal limits.  Visualized bilateral MCA branches within normal limits.  IMPRESSION: 1.  No flow signal in the region of the left cavernous sinus mass, in keeping with the favored diagnosis of meningioma. 2.  Essentially negative intracranial MRA; hypoplastic right ACA A1 segment with an infundibulum at its origin.   Original Report Authenticated By: Randall An, M.D.    Ct Maxillofacial W/cm  01/28/2012  *RADIOLOGY REPORT*  Clinical Data: Left-sided facial pain and fever.  Headache.  CT MAXILLOFACIAL WITH CONTRAST  Technique:  Multidetector CT imaging of the maxillofacial structures  was performed with intravenous contrast. Multiplanar CT image reconstructions were also generated.  Contrast: 55m OMNIPAQUE IOHEXOL 300 MG/ML  SOLN  Comparison: Cervical spine radiographs 02/25/2007.  Findings: Visualization of some portions of the scan limited due to streak artifact arising from dental hardware.  Minimal infiltration in the subcutaneous fat over the left mandibular region is demonstrated.  No discrete fluid collection or contrast enhancement to suggest abscess.  Cervical lymph nodes are not pathologically enlarged.  Salivary glands appear symmetrical. No mucosal or  prevertebral mass lesions appreciated.  Visualized jugular and carotid vessels appear patent.  No displacement of vessels or fat planes.  Visualized paranasal sinuses are not opacified.  Degenerative changes in the upper cervical spine. Degenerative changes in the temporomandibular joints bilaterally with joint space narrowing, deformity of the mandibular heads, and subcortical cystic changes.  There is an expansile lesion with central hypo density laterally to the left of the pituitary fossa.  This could represent a mass lesion or aneurysm arising in the region of Meckel's cave.  The lesion measures about 11 x 12 x 7 mm.  MRI correlation is recommended.  IMPRESSION: Mass lesion or possibly aneurysm arising to the left of the pituitary fossa.  MRI recommended for further evaluation.  Minimal soft tissue infiltration on the left without evidence of abscess. Degenerative changes in the cervical spine and temporomandibular joints.   Original Report Authenticated By: WNeale Burly M.D.     Assessment/Plan: Hospital day 1 continue cardiac workup responding well the steroids and Neurontin patient can be discharged when cleared and cardiology [facility]  LOS: 1 day     Tanishia Lemaster P 01/29/2012, 9:43 AM

## 2012-01-30 ENCOUNTER — Inpatient Hospital Stay (HOSPITAL_COMMUNITY): Payer: Medicare Other

## 2012-01-30 LAB — GLUCOSE, CAPILLARY
Glucose-Capillary: 75 mg/dL (ref 70–99)
Glucose-Capillary: 111 mg/dL — ABNORMAL HIGH (ref 70–99)

## 2012-01-30 MED ORDER — TECHNETIUM TC 99M TETROFOSMIN IV KIT: 30.0000 | PACK | Freq: Once | INTRAVENOUS | Status: DC | PRN

## 2012-01-30 MED ORDER — TECHNETIUM TC 99M SESTAMIBI GENERIC - CARDIOLITE
30.0000 | Freq: Once | INTRAVENOUS | Status: AC | PRN
  Administered 2012-01-30: 30 via INTRAVENOUS

## 2012-01-30 MED ORDER — GABAPENTIN 300 MG PO CAPS: 300.0000 mg | ORAL_CAPSULE | Freq: Every day | ORAL | Status: DC

## 2012-01-30 MED ORDER — ASPIRIN 81 MG PO TBEC: 81.0000 mg | DELAYED_RELEASE_TABLET | Freq: Every day | ORAL | Status: DC

## 2012-01-30 MED ORDER — GABAPENTIN 300 MG PO CAPS
600.0000 mg | ORAL_CAPSULE | Freq: Every day | ORAL | Status: DC
  Filled 2012-01-30: qty 2

## 2012-01-30 MED ORDER — ALBUTEROL SULFATE (5 MG/ML) 0.5% IN NEBU: 2.5000 mg | INHALATION_SOLUTION | RESPIRATORY_TRACT | Status: DC | PRN

## 2012-01-30 MED ORDER — TRAMADOL HCL 50 MG PO TABS: 50.0000 mg | ORAL_TABLET | Freq: Four times a day (QID) | ORAL | Status: AC

## 2012-01-30 MED ORDER — GABAPENTIN 300 MG PO CAPS: 600.0000 mg | ORAL_CAPSULE | Freq: Every day | ORAL | Status: DC

## 2012-01-30 NOTE — Progress Notes (Signed)
Subjective: Patient reports she feels better less neck face pain less-double vision  Objective: Vital signs in last 24 hours: Temp:  [97.5 F (36.4 C)-99 F (37.2 C)] 97.7 F (36.5 C) (09/13 1404) Pulse Rate:  [64-80] 64  (09/13 1404) Resp:  [15-18] 18  (09/13 1404) BP: (110-140)/(67-80) 110/67 mmHg (09/13 1404) SpO2:  [95 %-100 %] 98 % (09/13 1404)  Intake/Output from previous day: 09/12 0701 - 09/13 0700 In: 780 [P.O.:770; I.V.:10] Out: 1950 [Urine:1950] Intake/Output this shift: Total I/O In: 360 [P.O.:360] Out: -   Awake alert oriented pupils equal extraocular movements appear to be intact possibly a subtle left abducens nerve palsy  Lab Results:  Basename 01/29/12 0253 01/27/12 2225  WBC 4.0 5.3  HGB 13.4 13.2  HCT 39.7 40.8  PLT 154 156   BMET  Basename 01/29/12 0253 01/27/12 2225  NA 136 136  K 4.4 3.9  CL 99 98  CO2 29 32  GLUCOSE 149* 121*  BUN 15 19  CREATININE 1.08 1.09  CALCIUM 9.3 9.4    Studies/Results: Nm Myocar Multi W/spect W/wall Motion / Ef  01/30/2012  71 year old female complaining of chest pain.  The study is performed to exclude ischemia.  This is a 2-day rest stress protocol.  30 mCi of Myoview were used for both the rest and stress images.  Lexiscan was administered in the typical fashion.  The patients resting heart rate was 86 and following infusion 88. The blood pressure at rest was 117/59 and following infusion 108/68.  The patient had chest pain both prior to this study and during the infusion.  There were no ST changes.  The study was terminated per protocol.  Scintigraphic results: the images were reconstructed in the short axis as well as the vertical and horizontal long axis.  The stress images reveal a small defect in the distal anterior wall and apex. When compared to the rest images there is mild reversibility in that distribution.  The gated ejection fraction was 84% and the wall motion was normal.  End-systolic volume - 7 ml;  end-diastolic volume - 48 ml.  T I D - 0.71.  Final interpretation:  Lexiscan Myoview with no diagnostic electrocardiographic changes.  The scintigraphic results show possible mild ischemia in the distal anterior wall and apex; the defect may also be related to shifting breast attenuation. The gated ejection fraction was 84% and the wall motion was normal.   Original Report Authenticated By: Janeth Rase     Assessment/Plan: Patient is doing fairly well continuing her cardiac workup from her cavernous sinus mass appears neurologically stable and slightly improved this will need outpatient followup she patient is now requesting a referral to [facility] we went to make that happen on Monday depending on when the patient is discharged the hospital we can help arrange her followup with Dr. Harrison Mons or Dr. Annie Main [facility] for evaluation possible biopsy versus surgical radiosurgery for this cavernous sinus mass. I do think he would be beneficial to repeat the MRI. In addition I would also feel LL lumbar puncture would be beneficial in the setting as the fact that this is a very dangerous placed operating room with significant risk of bleeding and cranial neuropathy NLP may help show either cells or cytology consistent with either metabolic infectious versus lymphomatous process as opposed to the meningioma that is being held as the primary potential diagnosis. I will defer decision on this to her neurosurgeon at [facility] and I will arrange followup with.  LOS:  2 days     Zaeda Mcferran P 01/30/2012, 5:26 PM

## 2012-01-30 NOTE — Progress Notes (Signed)
Patient Name: [name] Date of Encounter: 01/30/2012  Principal Problem:  *Brain mass Active Problems:  High cholesterol  Thyroid disease  Hypertension  Chest pain  Rheumatoid arthritis  Osteoarthritis    SUBJECTIVE: Doing well, denies CP or SOB  OBJECTIVE Filed Vitals:   01/30/12 0351 01/30/12 0730 01/30/12 1251 01/30/12 1404  BP: 124/70 136/78 140/80 110/67  Pulse: 78 71 75 64  Temp: 98.3 F (36.8 C) 98.7 F (37.1 C) 97.5 F (36.4 C) 97.7 F (36.5 C)  TempSrc: Oral Oral Oral Oral  Resp: _0 Height:      Weight:      SpO2: 95% 95% 100% 98%    Intake/Output Summary (Last 24 hours) at 01/30/12 1820 Last data filed at 01/30/12 1320  Gross per 24 hour  Intake    600 ml  Output   1550 ml  Net   -950 ml   Filed Weights   01/28/12 2048  Weight: 253 lb 15.5 oz (115.2 kg)     PHYSICAL EXAM General: Well developed, well nourished, female in no acute distress. Head: Normocephalic, atraumatic.  Neck: Supple without bruits, JVD not elevated. Lungs:  Resp regular and unlabored, basilar rales bilaterally. Heart: RRR, S1, S2, no S3, S4, or murmur. Abdomen: Soft, non-tender, non-distended, BS + x 4.  Extremities: No clubbing, cyanosis, no edema.  Neuro: Alert and oriented X 3. Moves all extremities spontaneously. Psych: Normal affect.  LABS: CBC:  Basename 01/29/12 0253 01/27/12 2225  WBC 4.0 5.3  NEUTROABS -- 3.2  HGB 13.4 13.2  HCT 39.7 40.8  MCV 92.3 93.6  PLT 154 156   INR:  Basename 01/28/12 1454  INR 1.19   Basic Metabolic Panel:  Basename 01/29/12 0253 01/27/12 2225  NA 136 136  K 4.4 3.9  CL 99 98  CO2 29 32  GLUCOSE 149* 121*  BUN 15 19  CREATININE 1.08 1.09  CALCIUM 9.3 9.4  MG -- --  PHOS -- --   Liver Function Tests:  Basename 01/29/12 0253 01/27/12 2225  AST 16 16  ALT 22 24  ALKPHOS 60 60  BILITOT 1.1 1.1  PROT 6.7 6.9  ALBUMIN 3.6 3.8   Cardiac Enzymes:  Basename 01/29/12 0923 01/29/12 0252 01/28/12  2132 01/28/12 1611  CKTOTAL -- -- -- 87  CKMB -- -- -- 1.2  CKMBINDEX -- -- -- --  TROPONINI <0.30 <0.30 <0.30 --    Basename 01/28/12 0937 01/27/12 2341  TROPIPOC 0.15* 0.05   Hemoglobin A1C:  Basename 01/28/12 2131  HGBA1C 5.6   Thyroid Function Tests:  Basename 01/28/12 2131  TSH 3.104  T4TOTAL --  T3FREE --  THYROIDAB --   TELE:        ECG: SR  Current Medications:     . sodium chloride  1,000 mL Intravenous Once  . aspirin  324 mg Oral Once  . aspirin EC  81 mg Oral Daily  . atorvastatin  20 mg Oral q1800  . dexamethasone  10 mg Intravenous Q6H  . docusate sodium  100 mg Oral BID  . gabapentin  300 mg Oral BID  . hydroxychloroquine  200 mg Oral BID  . influenza  inactive virus vaccine  0.5 mL Intramuscular Tomorrow-1000  . insulin aspart  0-15 Units Subcutaneous TID WC  . irbesartan  300 mg Oral Daily  . levothyroxine  50 mcg Oral Daily  . methotrexate  7.5 mg Oral Weekly  . metoprolol succinate  100 mg Oral  Daily  .  morphine injection  4 mg Intravenous Once  .  morphine injection  4 mg Intravenous Once  . multivitamin with minerals  1 tablet Oral Daily  . nitroGLYCERIN  2-200 mcg/min Intravenous Once  . regadenoson  0.4 mg Intravenous Once  . spironolactone  25 mg Oral Daily  . traMADol  50 mg Oral Q6H   . sodium chloride    . sodium chloride      ASSESSMENT AND PLAN: 1. Atypical chest pain Low risk myoview is reviewed No further workup recommended Continue current medicines Plans for discharge are noted  2. HTN- improved Continue current medicine  Thompson Grayer, MD 01/30/2012 6:20 PM

## 2012-01-30 NOTE — Progress Notes (Signed)
Occupational Therapy Treatment Patient Details Name: MISHAEL KRYSIAK MRN: [id number] DOB: June 21, 1940 Today's Date: 01/30/2012 Time: 7544-9201 OT Time Calculation (min): 63 min  OT Assessment / Plan / Recommendation Comments on Treatment Session Pt. reports diplopia at all times.  Pt has patch and has been instructed in wear schedule/when to use.  Lt. lens of glasses occluded temporally which reduces impact of diplopia while allowing eyes to work binocularly, and allowing peripheral vision to remain unoccluded to assist with balance.  Pt. and dtr have been instructed how to occlude glasses, and pt. has been instructed in HEP for vision.   Will see pt. again this pm to gauge how temporal occlusion is working and to determind if she is experiencing eye fatigue on Rt.     Follow Up Recommendations  No OT follow up (until after plan for mass determined)    Barriers to Discharge       Equipment Recommendations  None recommended by OT    Recommendations for Other Services    Frequency     Plan Discharge plan remains appropriate    Precautions / Restrictions Precautions Precautions: Fall Restrictions Weight Bearing Restrictions: No   Pertinent Vitals/Pain     ADL  ADL Comments: pt. had eye patch and had questions about how it fit - she reports she can still see around patch resulting in diplopia.  Patch reshaped to fit pt better.  Pt. has glasses today.   Nasal occlusion to Rt. lens initially performed and pt. reports she sees only diplopia when looking to far right (vs. constantly with no intervention).   However, after ~30 mins, pt. reports eye fatigue Lt. eye.  Nasal occlusion removed, and temporal occlusion to Lt. lens.  This was successful in eliminating diplopia except when pt. looking inferiorly.  Discussed pt. closing Lt. eye when she experiences this.  With temporal occlusion, pt. able to read without difficulty.  Pt and pt dtr were instructed how to occlude the glasses, purpose of  occlusion.  When to use patch as a back up, need to alternate the patch if necessary.   Also discussed need for supervision when ambulatory due to loss of depth perception especially when outside, or in community, and pt. aware and agrees with recommendation for no driving.  Pt. was instructed in occulomotor exercises.    OT Diagnosis:    OT Problem List:   OT Treatment Interventions:     OT Goals Miscellaneous OT Goals OT Goal: Miscellaneous Goal #1 - Progress: Progressing toward goals OT Goal: Miscellaneous Goal #2 - Progress: Progressing toward goals  Visit Information  Last OT Received On: 01/30/12 Assistance Needed: +1    Subjective Data      Prior Functioning  Home Living Lives With: Spouse Available Help at Discharge: Family;Available 24 hours/day Type of Home: House Home Access: Stairs to enter [employer] of Steps: 1 Entrance Stairs-Rails: None Home Layout: Two level;Able to live on main level with bedroom/bathroom Bathroom Shower/Tub: Multimedia programmer: Standard Home Adaptive Equipment: Bedside commode/3-in-1;Straight cane;Shower chair with back Prior Function Level of Independence: Needs assistance Needs Assistance: Bathing;Meal Prep;Light Housekeeping Bath: Minimal Meal Prep: Maximal Light Housekeeping: Maximal Able to Take Stairs?: No Driving: Yes Vocation: Retired 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 difficulties Dominant Hand: Right    Cognition  Overall Cognitive Status: Appears within functional limits for tasks assessed/performed Arousal/Alertness: Awake/alert Orientation Level: Appears intact for tasks assessed Behavior During Session: [facility] for tasks performed    Mobility  Shoulder Instructions Bed Mobility Bed  Mobility: Supine to Sit;Sitting - Scoot to Edge of Bed;Sit to Supine Rolling Left: 6: Modified independent (Device/Increase time);With rail Left Sidelying to Sit: 6: Modified independent (Device/Increase time);With  rails;HOB elevated Supine to Sit: 6: Modified independent (Device/Increase time) Sitting - Scoot to Edge of Bed: 6: Modified independent (Device/Increase time);With rail Sit to Sidelying Left: 5: Supervision;With rail;HOB elevated Details for Bed Mobility Assistance: Verbal cues for technique for ease of transitions. Transfers Transfers: Sit to Stand;Stand to Sit Sit to Stand: 5: Supervision;From bed;With upper extremity assist Stand to Sit: 5: Supervision;To bed;With upper extremity assist Details for Transfer Assistance: Cues for safety and hand placement.       Exercises      Balance     End of Session OT - End of Session Activity Tolerance: Patient tolerated treatment well Patient left: in bed;with call bell/phone within reach;with family/visitor present  [place], Ellard Artis M 01/30/2012, 4:55 PM

## 2012-01-30 NOTE — Progress Notes (Signed)
Occupational Therapy Treatment Patient Details Name: [name] MRN: [id number] DOB: Sep 23, 1940 Today's Date: 01/30/2012 Time: 1701-1720 OT Time Calculation (min): 19 min  OT Assessment / Plan / Recommendation Comments on Treatment Session Pt. tolerating temporal occlusion with no interference from diplopia    Follow Up Recommendations  No OT follow up    Barriers to Discharge       Equipment Recommendations  None recommended by OT    Recommendations for Other Services    Frequency Min 2X/week   Plan Discharge plan remains appropriate    Precautions / Restrictions Precautions Precautions: Fall Restrictions Weight Bearing Restrictions: No   Pertinent Vitals/Pain     ADL  ADL Comments: Pt tolerating temporal occlusion of glasses lt. lens.  Pt. reports no diplopia with glasses on, and reports that she is not experiencing eye fatigue Rt. eye.  Pt. is able to read, and is able to verbalize understanding of HEP, and precautions associated with visual deficits    OT Diagnosis:    OT Problem List:   OT Treatment Interventions:     OT Goals Acute Rehab OT Goals OT Goal Formulation: With patient Miscellaneous OT Goals OT Goal: Miscellaneous Goal #1 - Progress: Progressing toward goals OT Goal: Miscellaneous Goal #2 - Progress: Progressing toward goals  Visit Information  Last OT Received On: 01/30/12 Assistance Needed: +1    Subjective Data      Prior Functioning  Home Living Lives With: Spouse Available Help at Discharge: Family;Available 24 hours/day Type of Home: House Home Access: Stairs to enter [employer] of Steps: 1 Entrance Stairs-Rails: None Home Layout: Two level;Able to live on main level with bedroom/bathroom Bathroom Shower/Tub: Multimedia programmer: Standard Home Adaptive Equipment: Bedside commode/3-in-1;Straight cane;Shower chair with back Prior Function Level of Independence: Needs assistance Needs Assistance:  Bathing;Meal Prep;Light Housekeeping Bath: Minimal Meal Prep: Maximal Light Housekeeping: Maximal Able to Take Stairs?: No Driving: Yes Vocation: Retired 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 difficulties Dominant Hand: Right    Cognition  Overall Cognitive Status: Appears within functional limits for tasks assessed/performed Arousal/Alertness: Awake/alert Orientation Level: Appears intact for tasks assessed Behavior During Session: [facility] for tasks performed    Mobility  Shoulder Instructions Bed Mobility Bed Mobility: Supine to Sit;Sitting - Scoot to Edge of Bed;Sit to Supine Rolling Left: 6: Modified independent (Device/Increase time);With rail Left Sidelying to Sit: 6: Modified independent (Device/Increase time);With rails;HOB elevated Supine to Sit: 6: Modified independent (Device/Increase time) Sitting - Scoot to Edge of Bed: 6: Modified independent (Device/Increase time);With rail Sit to Sidelying Left: 5: Supervision;With rail;HOB elevated Details for Bed Mobility Assistance: Verbal cues for technique for ease of transitions. Transfers Transfers: Sit to Stand;Stand to Sit Sit to Stand: 5: Supervision;From bed;With upper extremity assist Stand to Sit: 5: Supervision;To bed;With upper extremity assist Details for Transfer Assistance: Cues for safety and hand placement.       Exercises      Balance     End of Session OT - End of Session Activity Tolerance: Patient tolerated treatment well Patient left: in bed;with call bell/phone within reach;with family/visitor present  Philip, Ellard Artis M 01/30/2012, 6:04 PM

## 2012-01-30 NOTE — Discharge Summary (Signed)
Physician Discharge Summary  [name] JOI:[id number] DOB: 11-11-1940 DOA: 01/28/2012  PCP: Maximino Greenland, MD  Admit date: 01/28/2012 Discharge date: 01/30/2012  Recommendations for Outpatient Follow-up:  1. Patient to f/u at [facility] for brain surgery as per her request   Discharge Diagnoses:  Principal Problem:  *Brain mass Active Problems:  High cholesterol  Thyroid disease  Hypertension  Chest pain  Rheumatoid arthritis  Osteoarthritis   Discharge Condition: good  Diet recommendation: regular   Filed Weights   01/28/12 2048  Weight: 115.2 kg (253 lb 15.5 oz)    History of present illness:  This is a 71 year old, African American female, who is obese, who has a history of rheumatoid arthritis, hypertension, diabetes, gout, who is somewhat debilitated due to her arthritis and who was in her usual state of health till yesterday when she started having pain and burning sensation in the left eye and behind the left eye and the left nose area. The left face area and left-sided headache. The pain was quite intense and became worse to become 10 out of 10 in intensity. The pain started radiating down her left side of the neck. And, then subsequently, she had the pressure-like chest pain in the center of her chest. The pain was 10 out of 10 in intensity and the persisted through the night and when she was given nitroglycerin in the ED, the pain has decreased to 2/10. This pain in the chest, somewhat radiated to the rib cage in to the back. She had little bit of shortness of breath. She has leg swelling on and off. She had some nausea, vomiting, today, and, then she decided to come in for further evaluation. She tried taking Claritin-D yesterday because she thought her face pain might have been related to sinusitis. However, she did not feel any better. Denies any syncopal episode. Denies any palpitations. Denies any visual changes. She reports a stress test many years ago and had a cardiac  catheterization about 10 years ago, done by Dr. Tamala Julian with [facility] cardiology.      Hospital Course:  1. Chest pain - she was placed under telemetry , observation in house. She underwent Myoview stress test at the recommendation of cardiology. She had no active ischemia during test or during hospitalization. Myoview showed breast attenuation c/w body habitus. Patinet does not require cardiology f/u 2. Brain mass - per recs from [facility] patient will need biopsy - she elected to go to [facility] to have it done.   Procedures:  Myoview stress test   Consultations:  [facility]   Kary Kos   Discharge Exam: Filed Vitals:   01/30/12 0351 01/30/12 0730 01/30/12 1251 01/30/12 1404  BP: 124/70 136/78 140/80 110/67  Pulse: 78 71 75 64  Temp: 98.3 F (36.8 C) 98.7 F (37.1 C) 97.5 F (36.4 C) 97.7 F (36.5 C)  TempSrc: Oral Oral Oral Oral  Resp: _0 Height:      Weight:      SpO2: 95% 95% 100% 98%    General: axox3 Cardiovascular: RRR Respiratory: CTAB  Discharge Instructions  Discharge Orders    Future Orders Please Complete By Expires   Diet - low sodium heart healthy      Increase activity slowly          Medication List     As of 01/30/2012  6:03 PM    TAKE these medications         acetaminophen 500 MG tablet  Commonly known as: TYLENOL   Take 500 mg by mouth 2 (two) times daily. Takes with Tramadol doses  Scheduled doses      ACTEMRA 400 MG/20ML Soln   Generic drug: tocilizumab   Inject 400 mg into the vein every 30 (thirty) days. Dose:  70m/kg  (4022m20ml)      albuterol (5 MG/ML) 0.5% nebulizer solution   Commonly known as: PROVENTIL   Take 0.5 mLs (2.5 mg total) by nebulization every 2 (two) hours as needed for wheezing or shortness of breath.      aspirin 81 MG EC tablet   Take 1 tablet (81 mg total) by mouth daily.      atorvastatin 20 MG tablet   Commonly known as: LIPITOR   Take 20 mg by mouth daily.      gabapentin 300 MG capsule    Commonly known as: NEURONTIN   Take 2 capsules (600 mg total) by mouth at bedtime.      levothyroxine 50 MCG tablet   Commonly known as: SYNTHROID, LEVOTHROID   Take 50 mcg by mouth daily.      methotrexate 2.5 MG tablet   Commonly known as: RHEUMATREX   Take 7.5 mg by mouth once a week. Usually on Thursday      metoprolol succinate 100 MG 24 hr tablet   Commonly known as: TOPROL-XL   Take 100 mg by mouth daily. Take with or immediately following a meal.      olmesartan 40 MG tablet   Commonly known as: BENICAR   Take 40 mg by mouth daily.      PLAQUENIL 200 MG tablet   Generic drug: hydroxychloroquine   Take 200 mg by mouth 2 (two) times daily.      predniSONE 5 MG tablet   Commonly known as: DELTASONE   Take 5 mg by mouth daily.      spironolactone 25 MG tablet   Commonly known as: ALDACTONE   Take 25 mg by mouth.      ULTRAM ER 100 MG 24 hr tablet   Generic drug: traMADol   Take 100 mg by mouth 2 (two) times daily. Pain  Scheduled doses           Follow-up Information    Follow up with SAMaximino GreenlandMD.   Contact information:   [id number] [phone]         The results of significant diagnostics from this hospitalization (including imaging, microbiology, ancillary and laboratory) are listed below for reference.    Significant Diagnostic Studies: Dg Chest 2 View  01/27/2012  *RADIOLOGY REPORT*  Clinical Data: Chest pain.  Headache.  Vomiting.  CHEST - 2 VIEW  Comparison: 01/02/2010.  Findings: Shallow inspiration.  Mild cardiac enlargement with increased pulmonary vascularity suggesting mild vascular congestion.  Infiltration or atelectasis in the lung bases.  No blunting of costophrenic angles.  No pneumothorax.  Tortuous aorta. Degenerative changes in the spine.  IMPRESSION: Mild cardiac enlargement and pulmonary vascular congestion. Bilateral basilar atelectasis or infiltration.   Original Report Authenticated  By: WINeale BurlyM.D.    [facility]  01/28/2012  *RADIOLOGY REPORT*  Clinical Data:  [age]ear old female with left side facial pain, headache.  Left cavernous sinus region mass on face CT.  MRI HEAD WITHOUT AND WITH CONTRAST  Technique: Multiplanar, multiecho pulse sequences of the brain and surrounding structures were obtained according to standard protocol without and with intravenous  contrast.  Contrast: 76m MULTIHANCE GADOBENATE DIMEGLUMINE 529 MG/ML IV SOLN  Comparison: Face CT with contrast 01/28/2012.  Findings:  Normal cerebral volume.  No ventriculomegaly. No restricted diffusion to suggest acute infarction.  No acute intracranial hemorrhage identified.  No midline shift.  Pituitary appears within normal limits.  Negative cervicomedullary junction visualized cervical spine. Major intracranial vascular flow voids are preserved, and MRA findings are below.  Abnormality at the left cavernous sinus seen on the earlier comparison correspond to abnormal soft tissue infiltrating the left sinus and projecting slightly posteriorly off of the posterior clinoid process.  The mass shows mildly abnormal diffusion (series 411), decreased T2 signal (series 10 images 16 and 17), isointense to cortex T1 signal, postcontrast enhancement with a central nonenhancing portion (series 11 image 18) and associated dural thickening with dural tail tracking into the left middle cranial fossa (image 17).  The mass encompasses 13 x 22 x 12 mm (transverse by AP by CC) not including all of the lateral extending dural tail.  The left fifth nerve cisternal segment appears mildly deviated laterally.  There is dural thickening surrounding the left trigeminal ganglion at MMirant(series 12 image 16).  No abnormal enhancement of the ganglion itself, or the cisternal nerves.  There is cavernous sinus infiltration.  No thickening or enhancement of the left V2 segment.  No associated cerebral edema.  Hypoplastic left  anterior clinoid process is noted, no definite orbital apex involvement by the mass.  Elsewhere, no other abnormal enhancement.  Scattered cerebral white matter and central pons T2 and FLAIR hyperintensity, nonspecific but most commonly related to small vessel disease.  Superimposed dilated perivascular spaces including in the deep gray matter nuclei.  No cortical encephalomalacia.  Negative cerebellum.  Orbit soft tissues are within normal limits. Visualized paranasal sinuses and mastoids are clear.  Negative scalp soft tissues.  IMPRESSION:  1.  Infiltrative lesion of the left cavernous sinus with posterior extension off the posterior clinoid process as seen on the comparison CT.  Associated dural thickening and dural tail extending in the left middle cranial fossa. 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 Malignant tumor such as lymphoma is considered but felt less likely. 2.   Left cavernous sinus infiltration by the mass and dural thickening surrounding the left gasserian ganglion.  No definite left orbital apex involvement. 3.  No other acute intracranial abnormality.  Nonspecific signal changes in the brain, favor due to chronic small vessel disease. 4.  MRA findings are below.  MRA HEAD WITHOUT CONTRAST  Technique: Angiographic images of the Circle of Willis were obtained using MRA technique without  intravenous contrast.  Findings:  Artifact at the cervicomedullary junction. Antegrade flow in the posterior circulation with codominant distal vertebral arteries.  The no distal vertebral stenosis.  Normal vertebrobasilar junction.  No basilar stenosis.  Mild basilar tortuosity.  AICA vessel origins are patent.  SCA and PCA origins are patent.  Fetal type left PCA.  Bilateral PCA branches are within normal limits.  Left posterior communicating artery diminutive or absent.  Antegrade flow in both ICA siphons.  No ICA  stenosis.  In the left cavernous ICA and left ICA terminus there is no flow signal in the region of the soft tissue mass (series 9 image 67).  Normal bilateral MCA origins.  Normal left ACA origin.  Hypoplastic right ACA A1 segment with an infundibulum at its origin.  Anterior  communicating artery within normal limits.  Visualized ACA branches within normal limits.  Visualized bilateral MCA branches within normal limits.  IMPRESSION: 1.  No flow signal in the region of the left cavernous sinus mass, in keeping with the favored diagnosis of meningioma. 2.  Essentially negative intracranial MRA; hypoplastic right ACA A1 segment with an infundibulum at its origin.   Original Report Authenticated By: Randall An, M.D.    Mr [name] [facility] Contrast  01/28/2012  *RADIOLOGY REPORT*  Clinical Data:  71 year old female with left side facial pain, headache.  Left cavernous sinus region mass on face CT.  MRI HEAD WITHOUT AND WITH CONTRAST  Technique: Multiplanar, multiecho pulse sequences of the brain and surrounding structures were obtained according to standard protocol without and with intravenous contrast.  Contrast: 70m MULTIHANCE GADOBENATE DIMEGLUMINE 529 MG/ML IV SOLN  Comparison: Face CT with contrast 01/28/2012.  Findings:  Normal cerebral volume.  No ventriculomegaly. No restricted diffusion to suggest acute infarction.  No acute intracranial hemorrhage identified.  No midline shift.  Pituitary appears within normal limits.  Negative cervicomedullary junction visualized cervical spine. Major intracranial vascular flow voids are preserved, and MRA findings are below.  Abnormality at the left cavernous sinus seen on the earlier comparison correspond to abnormal soft tissue infiltrating the left sinus and projecting slightly posteriorly off of the posterior clinoid process.  The mass shows mildly abnormal diffusion (series 411), decreased T2 signal (series 10 images 16 and 17), isointense to cortex T1 signal,  postcontrast enhancement with a central nonenhancing portion (series 11 image 18) and associated dural thickening with dural tail tracking into the left middle cranial fossa (image 17).  The mass encompasses 13 x 22 x 12 mm (transverse by AP by CC) not including all of the lateral extending dural tail.  The left fifth nerve cisternal segment appears mildly deviated laterally.  There is dural thickening surrounding the left trigeminal ganglion at MMirant(series 12 image 16).  No abnormal enhancement of the ganglion itself, or the cisternal nerves.  There is cavernous sinus infiltration.  No thickening or enhancement of the left V2 segment.  No associated cerebral edema.  Hypoplastic left anterior clinoid process is noted, no definite orbital apex involvement by the mass.  Elsewhere, no other abnormal enhancement.  Scattered cerebral white matter and central pons T2 and FLAIR hyperintensity, nonspecific but most commonly related to small vessel disease.  Superimposed dilated perivascular spaces including in the deep gray matter nuclei.  No cortical encephalomalacia.  Negative cerebellum.  Orbit soft tissues are within normal limits. Visualized paranasal sinuses and mastoids are clear.  Negative scalp soft tissues.  IMPRESSION:  1.  Infiltrative lesion of the left cavernous sinus with posterior extension off the posterior clinoid process as seen on the comparison CT.  Associated dural thickening and dural tail extending in the left middle cranial fossa. 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 Malignant tumor such as lymphoma is considered but felt less likely. 2.   Left cavernous sinus infiltration by the mass and dural thickening surrounding the left gasserian ganglion.  No definite left orbital apex involvement. 3.  No other acute intracranial abnormality.  Nonspecific signal changes in the brain, favor due to  chronic small vessel disease. 4.  MRA findings are below.  MRA HEAD WITHOUT CONTRAST  Technique: Angiographic images of the Circle of Willis were obtained using MRA technique without  intravenous  contrast.  Findings:  Artifact at the cervicomedullary junction. Antegrade flow in the posterior circulation with codominant distal vertebral arteries.  The no distal vertebral stenosis.  Normal vertebrobasilar junction.  No basilar stenosis.  Mild basilar tortuosity.  AICA vessel origins are patent.  SCA and PCA origins are patent.  Fetal type left PCA.  Bilateral PCA branches are within normal limits.  Left posterior communicating artery diminutive or absent.  Antegrade flow in both ICA siphons.  No ICA stenosis.  In the left cavernous ICA and left ICA terminus there is no flow signal in the region of the soft tissue mass (series 9 image 67).  Normal bilateral MCA origins.  Normal left ACA origin.  Hypoplastic right ACA A1 segment with an infundibulum at its origin.  Anterior communicating artery within normal limits.  Visualized ACA branches within normal limits.  Visualized bilateral MCA branches within normal limits.  IMPRESSION: 1.  No flow signal in the region of the left cavernous sinus mass, in keeping with the favored diagnosis of meningioma. 2.  Essentially negative intracranial MRA; hypoplastic right ACA A1 segment with an infundibulum at its origin.   Original Report Authenticated By: Randall An, M.D.    Nm Myocar Multi W/spect W/wall Motion / Ef  01/30/2012  71 year old female complaining of chest pain.  The study is performed to exclude ischemia.  This is a 2-day rest stress protocol.  30 mCi of Myoview were used for both the rest and stress images.  Lexiscan was administered in the typical fashion.  The patients resting heart rate was 86 and following infusion 88. The blood pressure at rest was 117/59 and following infusion 108/68.  The patient had chest pain both prior to this study and during the  infusion.  There were no ST changes.  The study was terminated per protocol.  Scintigraphic results: the images were reconstructed in the short axis as well as the vertical and horizontal long axis.  The stress images reveal a small defect in the distal anterior wall and apex. When compared to the rest images there is mild reversibility in that distribution.  The gated ejection fraction was 84% and the wall motion was normal.  End-systolic volume - 7 ml; end-diastolic volume - 48 ml.  T I D - 0.71.  Final interpretation:  Lexiscan Myoview with no diagnostic electrocardiographic changes.  The scintigraphic results show possible mild ischemia in the distal anterior wall and apex; the defect may also be related to shifting breast attenuation. The gated ejection fraction was 84% and the wall motion was normal.   Original Report Authenticated By: [place] [facility]  01/28/2012  *RADIOLOGY REPORT*  Clinical Data: Left-sided facial pain and fever.  Headache.  CT MAXILLOFACIAL WITH CONTRAST  Technique:  Multidetector CT imaging of the maxillofacial structures was performed with intravenous contrast. Multiplanar CT image reconstructions were also generated.  Contrast: 71m OMNIPAQUE IOHEXOL 300 MG/ML  SOLN  Comparison: Cervical spine radiographs 02/25/2007.  Findings: Visualization of some portions of the scan limited due to streak artifact arising from dental hardware.  Minimal infiltration in the subcutaneous fat over the left mandibular region is demonstrated.  No discrete fluid collection or contrast enhancement to suggest abscess.  Cervical lymph nodes are not pathologically enlarged.  Salivary glands appear symmetrical. No mucosal or prevertebral mass lesions appreciated.  Visualized jugular and carotid vessels appear patent.  No displacement of vessels or fat planes.  Visualized paranasal sinuses are not opacified.  Degenerative changes  in the upper cervical spine. Degenerative changes in the  temporomandibular joints bilaterally with joint space narrowing, deformity of the mandibular heads, and subcortical cystic changes.  There is an expansile lesion with central hypo density laterally to the left of the pituitary fossa.  This could represent a mass lesion or aneurysm arising in the region of Meckel's cave.  The lesion measures about 11 x 12 x 7 mm.  MRI correlation is recommended.  IMPRESSION: Mass lesion or possibly aneurysm arising to the left of the pituitary fossa.  MRI recommended for further evaluation.  Minimal soft tissue infiltration on the left without evidence of abscess. Degenerative changes in the cervical spine and temporomandibular joints.   Original Report Authenticated By: Neale Burly, M.D.     Microbiology: Recent Results (from the past 240 hour(s))  MRSA PCR SCREENING     Status: Normal   Collection Time   01/28/12 11:49 PM      Component Value Range Status Comment   MRSA by PCR NEGATIVE  NEGATIVE Final      Labs: Basic Metabolic Panel:  Lab 93/79/02 0253 01/27/12 2225  NA 136 136  K 4.4 3.9  CL 99 98  CO2 29 32  GLUCOSE 149* 121*  BUN 15 19  CREATININE 1.08 1.09  CALCIUM 9.3 9.4  MG -- --  PHOS -- --   Liver Function Tests:  Lab 01/29/12 0253 01/27/12 2225  AST 16 16  ALT 22 24  ALKPHOS 60 60  BILITOT 1.1 1.1  PROT 6.7 6.9  ALBUMIN 3.6 3.8   No results found for this basename: LIPASE:5,AMYLASE:5 in the last 168 hours No results found for this basename: AMMONIA:5 in the last 168 hours CBC:  Lab 01/29/12 0253 01/27/12 2225  WBC 4.0 5.3  NEUTROABS -- 3.2  HGB 13.4 13.2  HCT 39.7 40.8  MCV 92.3 93.6  PLT 154 156   Cardiac Enzymes:  Lab 01/29/12 0923 01/29/12 0252 01/28/12 2132 01/28/12 1611 01/28/12 1031  CKTOTAL -- -- -- 87 --  CKMB -- -- -- 1.2 --  CKMBINDEX -- -- -- -- --  TROPONINI <0.30 <0.30 <0.30 <0.30 <0.30   BNP: BNP (last 3 results) No results found for this basename: PROBNP:3 in the last 8760 hours CBG:  Lab  01/30/12 1636 01/30/12 1253 01/30/12 0832 01/29/12 2135 01/29/12 1702  GLUCAP 111* 75 107* 115* 149*     Signed:  Hanya Guerin  [facility] 01/30/2012, 6:03 PM

## 2012-01-30 NOTE — Evaluation (Signed)
Physical Therapy Evaluation Patient Details Name: [name] MRN: [id number] DOB: 1941/02/23 Today's Date: 01/30/2012 Time: 0141-0301 PT Time Calculation (min): 33 min  PT Assessment / Plan / Recommendation Clinical Impression  Patient is a 71 yo female admitted with headache/facial pain.  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  Patient also with history of debilitating arthritis, and was receiving OP PT prior to admission.  Patient with weakness in LLE, vision impairment, and decreased balance, all impacting fuctional mobility.  Patient will benefit from acute PT for mobility/gait training, balance training, and education.  Recommend OP PT continue at discharge.    PT Assessment  Patient needs continued PT services    Follow Up Recommendations  Outpatient PT;Supervision/Assistance - 24 hour    Barriers to Discharge None      Equipment Recommendations  None recommended by PT    Recommendations for Other Services     Frequency Min 3X/week    Precautions / Restrictions Precautions Precautions: Fall Restrictions Weight Bearing Restrictions: No         Mobility  Bed Mobility Bed Mobility: Rolling Left;Left Sidelying to Sit;Sitting - Scoot to Edge of Bed;Sit to Sidelying Left Rolling Left: 6: Modified independent (Device/Increase time);With rail Left Sidelying to Sit: 6: Modified independent (Device/Increase time);With rails;HOB elevated Sitting - Scoot to Edge of Bed: 6: Modified independent (Device/Increase time);With rail Sit to Sidelying Left: 5: Supervision;With rail;HOB elevated Details for Bed Mobility Assistance: Verbal cues for technique for ease of transitions. Transfers Transfers: Sit to Stand;Stand to Sit Sit to Stand: 4: Min assist;With upper extremity assist;From bed Stand to Sit: 4: Min assist;With upper extremity assist;To bed Details for Transfer Assistance: Cues for safety and hand placement. Ambulation/Gait Ambulation/Gait Assistance: 4: Min  assist Ambulation Distance (Feet): 92 Feet Assistive device: None Ambulation/Gait Assistance Details: Assist for balance/safety without assistive device.  Patient with decrease weight shift to left and decreased trunk rotation.  Somewhat unsteady gait.  Encouraged use of cane with gait. Gait Pattern: Step-through pattern;Decreased stance time - left;Decreased weight shift to left;Decreased trunk rotation Gait velocity: Slow gait speed Stairs: No    Exercises     PT Diagnosis: Difficulty walking;Abnormality of gait;Acute pain  PT Problem List: Decreased strength;Decreased activity tolerance;Decreased balance;Decreased mobility;Obesity;Pain (Imapired vision) PT Treatment Interventions: DME instruction;Gait training;Stair training;Functional mobility training;Therapeutic exercise;Balance training;Patient/family education   PT Goals Acute Rehab PT Goals PT Goal Formulation: With patient/family Time For Goal Achievement: 02/06/12 Potential to Achieve Goals: Good Pt will go Sit to Stand: with modified independence PT Goal: Sit to Stand - Progress: Goal set today Pt will go Stand to Sit: with modified independence PT Goal: Stand to Sit - Progress: Goal set today Pt will Stand: with modified independence;6 - 10 min;with unilateral upper extremity support PT Goal: Stand - Progress: Goal set today Pt will Ambulate: 51 - 150 feet;with modified independence;with least restrictive assistive device PT Goal: Ambulate - Progress: Goal set today Pt will Go Up / Down Stairs: 1-2 stairs;with min assist;with least restrictive assistive device PT Goal: Up/Down Stairs - Progress: Goal set today  Visit Information  Last PT Received On: 01/30/12 Assistance Needed: +1    Subjective Data  Subjective: "I've been getting PT as OP.  It's been helping alot." Patient Stated Goal: To get better   Prior Functioning  Home Living Lives With: Spouse Available Help at Discharge: Family;Available 24  hours/day Type of Home: House Home Access: Stairs to enter [employer] of Steps: 1 Entrance Stairs-Rails: None Home Layout:  Two level;Able to live on main level with bedroom/bathroom Bathroom Shower/Tub: Multimedia programmer: Standard Home Adaptive Equipment: Bedside commode/3-in-1;Straight cane;Shower chair with back Prior Function Level of Independence: Needs assistance Needs Assistance: Bathing;Meal Prep;Light Housekeeping Bath: Minimal Meal Prep: Maximal Light Housekeeping: Maximal Able to Take Stairs?: No Driving: Yes Vocation: Retired 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 difficulties Dominant Hand: Right    Cognition  Overall Cognitive Status: Appears within functional limits for tasks assessed/performed Arousal/Alertness: Awake/alert Orientation Level: Appears intact for tasks assessed Behavior During Session: [facility] for tasks performed    Extremity/Trunk Assessment Right Upper Extremity Assessment RUE ROM/Strength/Tone: Within functional levels Left Upper Extremity Assessment LUE ROM/Strength/Tone: Within functional levels (General weakness) Right Lower Extremity Assessment RLE ROM/Strength/Tone: WFL for tasks assessed RLE Sensation: WFL - Light Touch Left Lower Extremity Assessment LLE ROM/Strength/Tone: Deficits LLE ROM/Strength/Tone Deficits: Strength 4-/5; pain in hip/LE limits strength LLE Sensation: WFL - Light Touch   Balance    End of Session PT - End of Session Equipment Utilized During Treatment: Gait belt Activity Tolerance: Patient tolerated treatment well Patient left: in bed;with call bell/phone within reach;with family/visitor present Nurse Communication: Mobility status  GP     Despina Pole 01/30/2012, 4:14 PM Carita Pian. Sanjuana Kava, [place] Pager [phone]

## 2012-01-30 NOTE — Progress Notes (Signed)
Subjective: Patient reports pain is improved from her initial presentation but that she still has a lot of burning at night that prevents her from sleeping.  The only side effect she has noted from her medications is that of vivid dreams.  These are not bothersome for her due to the fact that she knows they are dreams.   Objective: Current vital signs: BP 136/78  Pulse 71  Temp 98.7 F (37.1 C) (Oral)  Resp 18  Ht _0  (1.626 m)  Wt 115.2 kg (253 lb 15.5 oz)  BMI 43.59 kg/m2  SpO2 95% Vital signs in last 24 hours: Temp:  [98.3 F (36.8 C)-99 F (37.2 C)] 98.7 F (37.1 C) (09/13 0730) Pulse Rate:  [71-80] 71  (09/13 0730) Resp:  [15-20] 18  (09/13 0730) BP: (115-136)/(68-78) 136/78 mmHg (09/13 0730) SpO2:  [95 %-96 %] 95 % (09/13 0730)  Intake/Output from previous day: 09/12 0701 - 09/13 0700 In: 780 [P.O.:770; I.V.:10] Out: 1950 [Urine:1950] Intake/Output this shift:   Nutritional status: Carb Control  Neurologic Exam: Mental Status: Alert, oriented, thought content appropriate.  Speech fluent without evidence of aphasia.  Able to follow 3 step commands without difficulty. Cranial Nerves: II: Discs flat bilaterally; Visual fields grossly normal, pupils equal, round, reactive to light and accommodation III,IV, VI: Left ptosis, mild decreased excursion laterally of the left eye.  No diplopia reported V,VII: smile symmetric, facial light touch sensation hyperesthetic on the left face, most prominent in V1, V2 VIII: hearing normal bilaterally IX,X: gag reflex present XI: bilateral shoulder shrug XII: midline tongue extension  Lab Results: Basic Metabolic Panel:  Lab 44/03/47 0253 01/27/12 2225  NA 136 136  K 4.4 3.9  CL 99 98  CO2 29 32  GLUCOSE 149* 121*  BUN 15 19  CREATININE 1.08 1.09  CALCIUM 9.3 9.4  MG -- --  PHOS -- --    Liver Function Tests:  Lab 01/29/12 0253 01/27/12 2225  AST 16 16  ALT 22 24  ALKPHOS 60 60  BILITOT 1.1 1.1  PROT 6.7 6.9    ALBUMIN 3.6 3.8   No results found for this basename: LIPASE:5,AMYLASE:5 in the last 168 hours No results found for this basename: AMMONIA:3 in the last 168 hours  CBC:  Lab 01/29/12 0253 01/27/12 2225  WBC 4.0 5.3  NEUTROABS -- 3.2  HGB 13.4 13.2  HCT 39.7 40.8  MCV 92.3 93.6  PLT 154 156    Cardiac Enzymes:  Lab 01/29/12 0923 01/29/12 0252 01/28/12 2132 01/28/12 1611 01/28/12 1031  CKTOTAL -- -- -- 87 --  CKMB -- -- -- 1.2 --  CKMBINDEX -- -- -- -- --  TROPONINI <0.30 <0.30 <0.30 <0.30 <0.30    Lipid Panel: No results found for this basename: CHOL:5,TRIG:5,HDL:5,CHOLHDL:5,VLDL:5,LDLCALC:5 in the last 168 hours  CBG:  Lab 01/30/12 1253 01/30/12 0832 01/29/12 2135 01/29/12 1702 01/29/12 1201  GLUCAP 75 107* 115* 149* 109*    Microbiology: Results for orders placed during the hospital encounter of 01/28/12  MRSA PCR SCREENING     Status: Normal   Collection Time   01/28/12 11:49 PM      Component Value Range Status Comment   MRSA by PCR NEGATIVE  NEGATIVE Final     Coagulation Studies:  Basename 01/28/12 1454  LABPROT 14.0  INR 1.06    Imaging: No results found.  Medications:  I have reviewed the patient's current medications. Scheduled:   . aspirin EC  81 mg Oral Daily  .  atorvastatin  20 mg Oral q1800  . docusate sodium  100 mg Oral BID  . gabapentin  300 mg Oral BID  . hydroxychloroquine  200 mg Oral BID  . insulin aspart  0-15 Units Subcutaneous TID WC  . irbesartan  300 mg Oral Daily  . levothyroxine  50 mcg Oral Daily  . methotrexate  7.5 mg Oral Weekly  . metoprolol succinate  100 mg Oral Daily  . multivitamin with minerals  1 tablet Oral Daily  . spironolactone  25 mg Oral Daily  . traMADol  50 mg Oral Q6H    Assessment/Plan:  Patient Active Hospital Problem List: Brain mass (01/28/2012)   Assessment: Patient is requesting further evaluation of her mass at [facility].  She does have significant night time pain.    Plan:  1. Increase night  time Neurontin to 665m.  Will continue day time dose as is-day time pain seems to be adequately controlled.  It was explained to the patient that we would likely not be able to eradicate all of her abnormal left facial sensations but make them tolerable.  2.  No further neurologic intervention is recommended at this time.  If further questions arise, please call or page at that time.  Thank you for allowing neurology to participate in the care of this patient.  [facility]    LOS: 2 days   LAlexis Goodell MD [facility] [phone]/13/2013  1:06 PM

## 2012-02-01 NOTE — ED Provider Notes (Signed)
Medical screening examination/treatment/procedure(s) were conducted as a shared visit with non-physician practitioner(s) and myself.  I personally evaluated the patient during the encounter   Ezequiel Essex, MD 02/01/12 1047

## 2012-02-02 DIAGNOSIS — D496 Neoplasm of unspecified behavior of brain: Secondary | ICD-10-CM | POA: Diagnosis not present

## 2012-02-02 DIAGNOSIS — I129 Hypertensive chronic kidney disease with stage 1 through stage 4 chronic kidney disease, or unspecified chronic kidney disease: Secondary | ICD-10-CM | POA: Diagnosis not present

## 2012-02-02 DIAGNOSIS — Z09 Encounter for follow-up examination after completed treatment for conditions other than malignant neoplasm: Secondary | ICD-10-CM | POA: Diagnosis not present

## 2012-02-02 DIAGNOSIS — E1129 Type 2 diabetes mellitus with other diabetic kidney complication: Secondary | ICD-10-CM | POA: Diagnosis not present

## 2012-02-02 DIAGNOSIS — N183 Chronic kidney disease, stage 3 unspecified: Secondary | ICD-10-CM | POA: Diagnosis not present

## 2012-02-02 NOTE — Progress Notes (Signed)
Pt d/c home with instructions, r/x, and f/u appointments. Pt, husband, and family verbalized understanding of instructions, home with husband/family.  Escorted out by Therapist, sports and NT.

## 2012-02-10 ENCOUNTER — Telehealth: Payer: Self-pay | Admitting: Oncology

## 2012-02-10 NOTE — Telephone Encounter (Signed)
C/D 02/10/12 for appt. 02/17/12

## 2012-02-12 DIAGNOSIS — D320 Benign neoplasm of cerebral meninges: Secondary | ICD-10-CM | POA: Diagnosis not present

## 2012-02-12 DIAGNOSIS — D329 Benign neoplasm of meninges, unspecified: Secondary | ICD-10-CM | POA: Insufficient documentation

## 2012-02-17 ENCOUNTER — Other Ambulatory Visit (HOSPITAL_BASED_OUTPATIENT_CLINIC_OR_DEPARTMENT_OTHER): Payer: Medicare Other | Admitting: Lab

## 2012-02-17 ENCOUNTER — Ambulatory Visit (HOSPITAL_BASED_OUTPATIENT_CLINIC_OR_DEPARTMENT_OTHER): Payer: Medicare Other | Admitting: Oncology

## 2012-02-17 ENCOUNTER — Ambulatory Visit: Payer: Medicare Other

## 2012-02-17 ENCOUNTER — Encounter: Payer: Self-pay | Admitting: Oncology

## 2012-02-17 ENCOUNTER — Other Ambulatory Visit: Payer: Self-pay | Admitting: *Deleted

## 2012-02-17 VITALS — BP 124/85 | HR 88 | Temp 98.7°F | Resp 20 | Ht 63.0 in | Wt 258.7 lb

## 2012-02-17 DIAGNOSIS — D320 Benign neoplasm of cerebral meninges: Secondary | ICD-10-CM

## 2012-02-17 DIAGNOSIS — M069 Rheumatoid arthritis, unspecified: Secondary | ICD-10-CM | POA: Diagnosis not present

## 2012-02-17 DIAGNOSIS — G939 Disorder of brain, unspecified: Secondary | ICD-10-CM

## 2012-02-17 DIAGNOSIS — D329 Benign neoplasm of meninges, unspecified: Secondary | ICD-10-CM | POA: Insufficient documentation

## 2012-02-17 HISTORY — DX: Benign neoplasm of meninges, unspecified: D32.9

## 2012-02-17 LAB — CBC WITH DIFFERENTIAL/PLATELET
BASO%: 0.5 % (ref 0.0–2.0)
LYMPH%: 18.1 % (ref 14.0–49.7)
MCHC: 33.2 g/dL (ref 31.5–36.0)
MONO#: 0.4 10*3/uL (ref 0.1–0.9)
Platelets: 175 10*3/uL (ref 145–400)
RBC: 3.95 10*6/uL (ref 3.70–5.45)
WBC: 6.4 10*3/uL (ref 3.9–10.3)

## 2012-02-17 LAB — COMPREHENSIVE METABOLIC PANEL (CC13)
ALT: 26 U/L (ref 0–55)
Alkaline Phosphatase: 59 U/L (ref 40–150)
CO2: 25 mEq/L (ref 22–29)
Sodium: 139 mEq/L (ref 136–145)
Total Bilirubin: 0.7 mg/dL (ref 0.20–1.20)
Total Protein: 6.5 g/dL (ref 6.4–8.3)

## 2012-02-17 NOTE — Progress Notes (Signed)
[name]  Telephone:([phone]) [phone] [phone]  MEDICAL ONCOLOGY - INITIAL CONSULATION    Referral MD Dr, Glendale Chard  Reason for Referral:  New diagnosis of cavernous sinus mass consistent with meningioma  Chief Complaint  Patient presents with  . New Evaluation   new diagnosis of meningioma of the left cavernous sinus.   HPI: 71 year old female with recently admitted to [facility] with left-sided facial pain and left-sided headaches as well as pain and burning sensation in the left eye and behind the left eye and left nose area there was a 10 out of 10. Because of this she was seen in the emergency room. She had MRI of the brain performed that showed infiltrative lesion of the left cavernous sinus with posterior extension off the posterior clinoid process there was associated dural thickening and dural tail extending in the left middle cranial fossa. There was no definite left orbital apex involvement. Patient also had an MRA performed that was essentially consistent with diagnosis of a meningioma. Because of this patient was seen at [facility] in radiation oncology. She is gong to undergo gamma knife treatment for the meningioma next week. She was also started on Neurontin which has helped with her symptoms. Otherwise patient is without any other complaints.     Past Medical History  Diagnosis Date  . Diabetes mellitus   . Hypertension   . Thyroid disease   . Arthritis   . High cholesterol   . 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 02/17/2012  :  Past Surgical History  Procedure Date  . Abdominal hysterectomy   . Ovary surgery   . Shoulder surgery   :  Current Outpatient Prescriptions  Medication Sig Dispense Refill  . acetaminophen (TYLENOL) 500 MG tablet Take 500 mg by mouth 2 (two) times daily. Takes with Tramadol doses Scheduled doses      . aspirin EC 81 MG EC tablet Take 1 tablet (81 mg total) by mouth  daily.      [name] atorvastatin (LIPITOR) 20 MG tablet Take 20 mg by mouth daily.      [name] gabapentin (NEURONTIN) 300 MG capsule Take 2 capsules (600 mg total) by mouth at bedtime.  60 capsule  0  . hydroxychloroquine (PLAQUENIL) 200 MG tablet Take 200 mg by mouth 2 (two) times daily.       [name] levothyroxine (SYNTHROID, LEVOTHROID) 50 MCG tablet Take 50 mcg by mouth daily.      . metoprolol succinate (TOPROL-XL) 100 MG 24 hr tablet Take 100 mg by mouth daily. Take with or immediately following a meal.      . olmesartan (BENICAR) 40 MG tablet Take 40 mg by mouth daily.      . predniSONE (DELTASONE) 5 MG tablet Take 5 mg by mouth daily.      [name] spironolactone (ALDACTONE) 25 MG tablet Take 25 mg by mouth.      [name] albuterol (PROVENTIL) (5 MG/ML) 0.5% nebulizer solution Take 0.5 mLs (2.5 mg total) by nebulization every 2 (two) hours as needed for wheezing or shortness of breath.  20 mL  0  . methotrexate (RHEUMATREX) 2.5 MG tablet Take 7.5 mg by mouth once a week. Usually on Thursday      . tocilizumab (ACTEMRA) 400 MG/20ML SOLN Inject 400 mg into the vein every 30 (thirty) days. Dose:  68m/kg  (4036m20ml)         Allergies  Allergen Reactions  . Codeine Swelling  Facial swelling  . Remicade (Infliximab)     "sent me into shock"  . Zestril (Lisinopril) Swelling    Face and neck swelling  :  Family History  Problem Relation Age of Onset  . Diabetes Mother   . Hypertension Father   . Diabetes Sister   . Diabetes Brother   . Hypertension Brother   . Heart attack Mother   . Lung cancer Father   :  History   Social History  . Marital Status: Married    Spouse Name: N/A    Number of Children: N/A  . Years of Education: N/A   Occupational History  . Not on file.   Social History Main Topics  . Smoking status: Never Smoker   . Smokeless tobacco: Not on file  . Alcohol Use: No  . Drug Use: No  . Sexually Active: Not Currently   Other Topics Concern  . Not on file   Social History  Narrative  . No narrative on file  :  patient is awake alert she denies in general fevers chills night sweats she does have some fatigue she does have aches and pains she is in no acute distress she denies any nausea or vomiting she occasionally is still getting her headaches and pressure behind the eye that radiates down to her throat. She has no abdominal pain no shortness of breath no chest pains or palpitations denies any bleeding problems or gait disturbances. Remainder of the 14 point review of systems is negative.  Exam: Filed Vitals:   02/17/12 1356  BP: 124/85  Pulse: 88  Temp: 98.7 F (37.1 C)  TempSrc: Oral  Resp: 20  Height: _0  (1.6 m)  Weight: 258 lb 11.2 oz (117.346 kg)    General:  well-nourished in no acute distress.  Eyes:  no scleral icterus.  ENT:  There were no oropharyngeal lesions.  Neck was without thyromegaly.  Lymphatics:  Negative cervical, supraclavicular or axillary adenopathy.  Respiratory: lungs were clear bilaterally without wheezing or crackles.  Cardiovascular:  Regular rate and rhythm, S1/S2, without murmur, rub or gallop.  There was no pedal edema.  GI:  abdomen was soft, flat, nontender, nondistended, without organomegaly.  Muscoloskeletal:  no spinal tenderness of palpation of vertebral spine.  Skin exam was without echymosis, petichae.  Neuro exam was nonfocal.  Patient was able to get on and off exam table without assistance.  Gait was normal.  Patient was alerted and oriented.  Attention was good.   Language was appropriate.  Mood was normal without depression.  Speech was not pressured.  Thought content was not tangential.     Lab Results  Component Value Date   WBC 6.4 02/17/2012   HGB 12.4 02/17/2012   HCT 37.2 02/17/2012   PLT 175 02/17/2012   GLUCOSE 149* 01/29/2012   ALT 22 01/29/2012   AST 16 01/29/2012   NA 136 01/29/2012   K 4.4 01/29/2012   CL 99 01/29/2012   CREATININE 1.08 01/29/2012   BUN 15 01/29/2012   CO2 29 01/29/2012   TSH 3.104  01/28/2012   INR 1.06 01/28/2012   HGBA1C 5.6 01/28/2012    Dg Chest 2 View  01/27/2012  *RADIOLOGY REPORT*  Clinical Data: Chest pain.  Headache.  Vomiting.  CHEST - 2 VIEW  Comparison: 01/02/2010.  Findings: Shallow inspiration.  Mild cardiac enlargement with increased pulmonary vascularity suggesting mild vascular congestion.  Infiltration or atelectasis in the lung bases.  No blunting of costophrenic angles.  No pneumothorax.  Tortuous aorta. Degenerative changes in the spine.  IMPRESSION: Mild cardiac enlargement and pulmonary vascular congestion. Bilateral basilar atelectasis or infiltration.   Original Report Authenticated By: Neale Burly, M.D.    Mr [facility] Wo Contrast  01/28/2012  *RADIOLOGY REPORT*  Clinical Data:  71 year old female with left side facial pain, headache.  Left cavernous sinus region mass on face CT.  MRI HEAD WITHOUT AND WITH CONTRAST  Technique: Multiplanar, multiecho pulse sequences of the brain and surrounding structures were obtained according to standard protocol without and with intravenous contrast.  Contrast: 37m MULTIHANCE GADOBENATE DIMEGLUMINE 529 MG/ML IV SOLN  Comparison: Face CT with contrast 01/28/2012.  Findings:  Normal cerebral volume.  No ventriculomegaly. No restricted diffusion to suggest acute infarction.  No acute intracranial hemorrhage identified.  No midline shift.  Pituitary appears within normal limits.  Negative cervicomedullary junction visualized cervical spine. Major intracranial vascular flow voids are preserved, and MRA findings are below.  Abnormality at the left cavernous sinus seen on the earlier comparison correspond to abnormal soft tissue infiltrating the left sinus and projecting slightly posteriorly off of the posterior clinoid process.  The mass shows mildly abnormal diffusion (series 411), decreased T2 signal (series 10 images 16 and 17), isointense to cortex T1 signal, postcontrast enhancement with a central nonenhancing portion  (series 11 image 18) and associated dural thickening with dural tail tracking into the left middle cranial fossa (image 17).  The mass encompasses 13 x 22 x 12 mm (transverse by AP by CC) not including all of the lateral extending dural tail.  The left fifth nerve cisternal segment appears mildly deviated laterally.  There is dural thickening surrounding the left trigeminal ganglion at MMirant(series 12 image 16).  No abnormal enhancement of the ganglion itself, or the cisternal nerves.  There is cavernous sinus infiltration.  No thickening or enhancement of the left V2 segment.  No associated cerebral edema.  Hypoplastic left anterior clinoid process is noted, no definite orbital apex involvement by the mass.  Elsewhere, no other abnormal enhancement.  Scattered cerebral white matter and central pons T2 and FLAIR hyperintensity, nonspecific but most commonly related to small vessel disease.  Superimposed dilated perivascular spaces including in the deep gray matter nuclei.  No cortical encephalomalacia.  Negative cerebellum.  Orbit soft tissues are within normal limits. Visualized paranasal sinuses and mastoids are clear.  Negative scalp soft tissues.  IMPRESSION:  1.  Infiltrative lesion of the left cavernous sinus with posterior extension off the posterior clinoid process as seen on the comparison CT.  Associated dural thickening and dural tail extending in the left middle cranial fossa. 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 Malignant tumor such as lymphoma is considered but felt less likely. 2.   Left cavernous sinus infiltration by the mass and dural thickening surrounding the left gasserian ganglion.  No definite left orbital apex involvement. 3.  No other acute intracranial abnormality.  Nonspecific signal changes in the brain, favor due to chronic small vessel disease. 4.  MRA findings are below.  MRA  HEAD WITHOUT CONTRAST  Technique: Angiographic images of the Circle of Willis were obtained using MRA technique without  intravenous contrast.  Findings:  Artifact at the cervicomedullary junction. Antegrade flow in the posterior circulation with codominant distal vertebral arteries.  The no distal vertebral stenosis.  Normal vertebrobasilar junction.  No basilar stenosis.  Mild basilar  tortuosity.  AICA vessel origins are patent.  SCA and PCA origins are patent.  Fetal type left PCA.  Bilateral PCA branches are within normal limits.  Left posterior communicating artery diminutive or absent.  Antegrade flow in both ICA siphons.  No ICA stenosis.  In the left cavernous ICA and left ICA terminus there is no flow signal in the region of the soft tissue mass (series 9 image 67).  Normal bilateral MCA origins.  Normal left ACA origin.  Hypoplastic right ACA A1 segment with an infundibulum at its origin.  Anterior communicating artery within normal limits.  Visualized ACA branches within normal limits.  Visualized bilateral MCA branches within normal limits.  IMPRESSION: 1.  No flow signal in the region of the left cavernous sinus mass, in keeping with the favored diagnosis of meningioma. 2.  Essentially negative intracranial MRA; hypoplastic right ACA A1 segment with an infundibulum at its origin.   Original Report Authenticated By: Randall An, M.D.    Mr [name] Wo Contrast  01/28/2012  *RADIOLOGY REPORT*  Clinical Data:  71 year old female with left side facial pain, headache.  Left cavernous sinus region mass on face CT.  MRI HEAD WITHOUT AND WITH CONTRAST  Technique: Multiplanar, multiecho pulse sequences of the brain and surrounding structures were obtained according to standard protocol without and with intravenous contrast.  Contrast: 50m MULTIHANCE GADOBENATE DIMEGLUMINE 529 MG/ML IV SOLN  Comparison: Face CT with contrast 01/28/2012.  Findings:  Normal cerebral volume.  No ventriculomegaly. No restricted  diffusion to suggest acute infarction.  No acute intracranial hemorrhage identified.  No midline shift.  Pituitary appears within normal limits.  Negative cervicomedullary junction visualized cervical spine. Major intracranial vascular flow voids are preserved, and MRA findings are below.  Abnormality at the left cavernous sinus seen on the earlier comparison correspond to abnormal soft tissue infiltrating the left sinus and projecting slightly posteriorly off of the posterior clinoid process.  The mass shows mildly abnormal diffusion (series 411), decreased T2 signal (series 10 images 16 and 17), isointense to cortex T1 signal, postcontrast enhancement with a central nonenhancing portion (series 11 image 18) and associated dural thickening with dural tail tracking into the left middle cranial fossa (image 17).  The mass encompasses 13 x 22 x 12 mm (transverse by AP by CC) not including all of the lateral extending dural tail.  The left fifth nerve cisternal segment appears mildly deviated laterally.  There is dural thickening surrounding the left trigeminal ganglion at MMirant(series 12 image 16).  No abnormal enhancement of the ganglion itself, or the cisternal nerves.  There is cavernous sinus infiltration.  No thickening or enhancement of the left V2 segment.  No associated cerebral edema.  Hypoplastic left anterior clinoid process is noted, no definite orbital apex involvement by the mass.  Elsewhere, no other abnormal enhancement.  Scattered cerebral white matter and central pons T2 and FLAIR hyperintensity, nonspecific but most commonly related to small vessel disease.  Superimposed dilated perivascular spaces including in the deep gray matter nuclei.  No cortical encephalomalacia.  Negative cerebellum.  Orbit soft tissues are within normal limits. Visualized paranasal sinuses and mastoids are clear.  Negative scalp soft tissues.  IMPRESSION:  1.  Infiltrative lesion of the left cavernous sinus with  posterior extension off the posterior clinoid process as seen on the comparison CT.  Associated dural thickening and dural tail extending in the left middle cranial fossa. In conjunction with the MRA findings (see below) and absent  any sphenoid sinus disease, favor this is a left cavernous sinus meningioma. Malignant tumor such as lymphoma is considered but felt less likely. 2.   Left cavernous sinus infiltration by the mass and dural thickening surrounding the left gasserian ganglion.  No definite left orbital apex involvement. 3.  No other acute intracranial abnormality.  Nonspecific signal changes in the brain, favor due to chronic small vessel disease. 4.  MRA findings are below.  MRA HEAD WITHOUT CONTRAST  Technique: Angiographic images of the Circle of Willis were obtained using MRA technique without  intravenous contrast.  Findings:  Artifact at the cervicomedullary junction. Antegrade flow in the posterior circulation with codominant distal vertebral arteries.  The no distal vertebral stenosis.  Normal vertebrobasilar junction.  No basilar stenosis.  Mild basilar tortuosity.  AICA vessel origins are patent.  SCA and PCA origins are patent.  Fetal type left PCA.  Bilateral PCA branches are within normal limits.  Left posterior communicating artery diminutive or absent.  Antegrade flow in both ICA siphons.  No ICA stenosis.  In the left cavernous ICA and left ICA terminus there is no flow signal in the region of the soft tissue mass (series 9 image 67).  Normal bilateral MCA origins.  Normal left ACA origin.  Hypoplastic right ACA A1 segment with an infundibulum at its origin.  Anterior communicating artery within normal limits.  Visualized ACA branches within normal limits.  Visualized bilateral MCA branches within normal limits.  IMPRESSION: 1.  No flow signal in the region of the left cavernous sinus mass, in keeping with the favored diagnosis of meningioma. 2.  Essentially negative intracranial MRA;  hypoplastic right ACA A1 segment with an infundibulum at its origin.   Original Report Authenticated By: Randall An, M.D.    Nm Myocar Multi W/spect W/wall Motion / Ef  01/30/2012  71 year old female complaining of chest pain.  The study is performed to exclude ischemia.  This is a 2-day rest stress protocol.  30 mCi of Myoview were used for both the rest and stress images.  Lexiscan was administered in the typical fashion.  The patients resting heart rate was 86 and following infusion 88. The blood pressure at rest was 117/59 and following infusion 108/68.  The patient had chest pain both prior to this study and during the infusion.  There were no ST changes.  The study was terminated per protocol.  Scintigraphic results: the images were reconstructed in the short axis as well as the vertical and horizontal long axis.  The stress images reveal a small defect in the distal anterior wall and apex. When compared to the rest images there is mild reversibility in that distribution.  The gated ejection fraction was 84% and the wall motion was normal.  End-systolic volume - 7 ml; end-diastolic volume - 48 ml.  T I D - 0.71.  Final interpretation:  Lexiscan Myoview with no diagnostic electrocardiographic changes.  The scintigraphic results show possible mild ischemia in the distal anterior wall and apex; the defect may also be related to shifting breast attenuation. The gated ejection fraction was 84% and the wall motion was normal.   Original Report Authenticated By: [place] [facility]  01/28/2012  *RADIOLOGY REPORT*  Clinical Data: Left-sided facial pain and fever.  Headache.  CT MAXILLOFACIAL WITH CONTRAST  Technique:  Multidetector CT imaging of the maxillofacial structures was performed with intravenous contrast. Multiplanar CT image reconstructions were also generated.  Contrast: 45m OMNIPAQUE IOHEXOL 300 MG/ML  SOLN  Comparison: Cervical spine radiographs 02/25/2007.  Findings:  Visualization of some portions of the scan limited due to streak artifact arising from dental hardware.  Minimal infiltration in the subcutaneous fat over the left mandibular region is demonstrated.  No discrete fluid collection or contrast enhancement to suggest abscess.  Cervical lymph nodes are not pathologically enlarged.  Salivary glands appear symmetrical. No mucosal or prevertebral mass lesions appreciated.  Visualized jugular and carotid vessels appear patent.  No displacement of vessels or fat planes.  Visualized paranasal sinuses are not opacified.  Degenerative changes in the upper cervical spine. Degenerative changes in the temporomandibular joints bilaterally with joint space narrowing, deformity of the mandibular heads, and subcortical cystic changes.  There is an expansile lesion with central hypo density laterally to the left of the pituitary fossa.  This could represent a mass lesion or aneurysm arising in the region of Meckel's cave.  The lesion measures about 11 x 12 x 7 mm.  MRI correlation is recommended.  IMPRESSION: Mass lesion or possibly aneurysm arising to the left of the pituitary fossa.  MRI recommended for further evaluation.  Minimal soft tissue infiltration on the left without evidence of abscess. Degenerative changes in the cervical spine and temporomandibular joints.   Original Report Authenticated By: Neale Burly, M.D.      Dg Chest 2 View  01/27/2012  *RADIOLOGY REPORT*  Clinical Data: Chest pain.  Headache.  Vomiting.  CHEST - 2 VIEW  Comparison: 01/02/2010.  Findings: Shallow inspiration.  Mild cardiac enlargement with increased pulmonary vascularity suggesting mild vascular congestion.  Infiltration or atelectasis in the lung bases.  No blunting of costophrenic angles.  No pneumothorax.  Tortuous aorta. Degenerative changes in the spine.  IMPRESSION: Mild cardiac enlargement and pulmonary vascular congestion. Bilateral basilar atelectasis or infiltration.   Original  Report Authenticated By: Neale Burly, M.D.    Mr [facility] Wo Contrast  01/28/2012  *RADIOLOGY REPORT*  Clinical Data:  71 year old female with left side facial pain, headache.  Left cavernous sinus region mass on face CT.  MRI HEAD WITHOUT AND WITH CONTRAST  Technique: Multiplanar, multiecho pulse sequences of the brain and surrounding structures were obtained according to standard protocol without and with intravenous contrast.  Contrast: 42m MULTIHANCE GADOBENATE DIMEGLUMINE 529 MG/ML IV SOLN  Comparison: Face CT with contrast 01/28/2012.  Findings:  Normal cerebral volume.  No ventriculomegaly. No restricted diffusion to suggest acute infarction.  No acute intracranial hemorrhage identified.  No midline shift.  Pituitary appears within normal limits.  Negative cervicomedullary junction visualized cervical spine. Major intracranial vascular flow voids are preserved, and MRA findings are below.  Abnormality at the left cavernous sinus seen on the earlier comparison correspond to abnormal soft tissue infiltrating the left sinus and projecting slightly posteriorly off of the posterior clinoid process.  The mass shows mildly abnormal diffusion (series 411), decreased T2 signal (series 10 images 16 and 17), isointense to cortex T1 signal, postcontrast enhancement with a central nonenhancing portion (series 11 image 18) and associated dural thickening with dural tail tracking into the left middle cranial fossa (image 17).  The mass encompasses 13 x 22 x 12 mm (transverse by AP by CC) not including all of the lateral extending dural tail.  The left fifth nerve cisternal segment appears mildly deviated laterally.  There is dural thickening surrounding the left trigeminal ganglion at MMirant(series 12 image 16).  No abnormal enhancement of the ganglion itself, or the cisternal nerves.  There  is cavernous sinus infiltration.  No thickening or enhancement of the left V2 segment.  No associated cerebral  edema.  Hypoplastic left anterior clinoid process is noted, no definite orbital apex involvement by the mass.  Elsewhere, no other abnormal enhancement.  Scattered cerebral white matter and central pons T2 and FLAIR hyperintensity, nonspecific but most commonly related to small vessel disease.  Superimposed dilated perivascular spaces including in the deep gray matter nuclei.  No cortical encephalomalacia.  Negative cerebellum.  Orbit soft tissues are within normal limits. Visualized paranasal sinuses and mastoids are clear.  Negative scalp soft tissues.  IMPRESSION:  1.  Infiltrative lesion of the left cavernous sinus with posterior extension off the posterior clinoid process as seen on the comparison CT.  Associated dural thickening and dural tail extending in the left middle cranial fossa. 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 Malignant tumor such as lymphoma is considered but felt less likely. 2.   Left cavernous sinus infiltration by the mass and dural thickening surrounding the left gasserian ganglion.  No definite left orbital apex involvement. 3.  No other acute intracranial abnormality.  Nonspecific signal changes in the brain, favor due to chronic small vessel disease. 4.  MRA findings are below.  MRA HEAD WITHOUT CONTRAST  Technique: Angiographic images of the Circle of Willis were obtained using MRA technique without  intravenous contrast.  Findings:  Artifact at the cervicomedullary junction. Antegrade flow in the posterior circulation with codominant distal vertebral arteries.  The no distal vertebral stenosis.  Normal vertebrobasilar junction.  No basilar stenosis.  Mild basilar tortuosity.  AICA vessel origins are patent.  SCA and PCA origins are patent.  Fetal type left PCA.  Bilateral PCA branches are within normal limits.  Left posterior communicating artery diminutive or absent.  Antegrade flow in  both ICA siphons.  No ICA stenosis.  In the left cavernous ICA and left ICA terminus there is no flow signal in the region of the soft tissue mass (series 9 image 67).  Normal bilateral MCA origins.  Normal left ACA origin.  Hypoplastic right ACA A1 segment with an infundibulum at its origin.  Anterior communicating artery within normal limits.  Visualized ACA branches within normal limits.  Visualized bilateral MCA branches within normal limits.  IMPRESSION: 1.  No flow signal in the region of the left cavernous sinus mass, in keeping with the favored diagnosis of meningioma. 2.  Essentially negative intracranial MRA; hypoplastic right ACA A1 segment with an infundibulum at its origin.   Original Report Authenticated By: Randall An, M.D.    Mr [name] Cos Wo Contrast  01/28/2012  *RADIOLOGY REPORT*  Clinical Data:  71 year old female with left side facial pain, headache.  Left cavernous sinus region mass on face CT.  MRI HEAD WITHOUT AND WITH CONTRAST  Technique: Multiplanar, multiecho pulse sequences of the brain and surrounding structures were obtained according to standard protocol without and with intravenous contrast.  Contrast: 31m MULTIHANCE GADOBENATE DIMEGLUMINE 529 MG/ML IV SOLN  Comparison: Face CT with contrast 01/28/2012.  Findings:  Normal cerebral volume.  No ventriculomegaly. No restricted diffusion to suggest acute infarction.  No acute intracranial hemorrhage identified.  No midline shift.  Pituitary appears within normal limits.  Negative cervicomedullary junction visualized cervical spine. Major intracranial vascular flow voids are preserved, and MRA findings are below.  Abnormality at the left cavernous sinus seen on the earlier comparison correspond  to abnormal soft tissue infiltrating the left sinus and projecting slightly posteriorly off of the posterior clinoid process.  The mass shows mildly abnormal diffusion (series 411), decreased T2 signal (series 10 images 16 and 17), isointense  to cortex T1 signal, postcontrast enhancement with a central nonenhancing portion (series 11 image 18) and associated dural thickening with dural tail tracking into the left middle cranial fossa (image 17).  The mass encompasses 13 x 22 x 12 mm (transverse by AP by CC) not including all of the lateral extending dural tail.  The left fifth nerve cisternal segment appears mildly deviated laterally.  There is dural thickening surrounding the left trigeminal ganglion at [employer] (series 12 image 16).  No abnormal enhancement of the ganglion itself, or the cisternal nerves.  There is cavernous sinus infiltration.  No thickening or enhancement of the left V2 segment.  No associated cerebral edema.  Hypoplastic left anterior clinoid process is noted, no definite orbital apex involvement by the mass.  Elsewhere, no other abnormal enhancement.  Scattered cerebral white matter and central pons T2 and FLAIR hyperintensity, nonspecific but most commonly related to small vessel disease.  Superimposed dilated perivascular spaces including in the deep gray matter nuclei.  No cortical encephalomalacia.  Negative cerebellum.  Orbit soft tissues are within normal limits. Visualized paranasal sinuses and mastoids are clear.  Negative scalp soft tissues.  IMPRESSION:  1.  Infiltrative lesion of the left cavernous sinus with posterior extension off the posterior clinoid process as seen on the comparison CT.  Associated dural thickening and dural tail extending in the left middle cranial fossa. 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 Malignant tumor such as lymphoma is considered but felt less likely. 2.   Left cavernous sinus infiltration by the mass and dural thickening surrounding the left gasserian ganglion.  No definite left orbital apex involvement. 3.  No other acute intracranial abnormality.  Nonspecific signal changes in the  brain, favor due to chronic small vessel disease. 4.  MRA findings are below.  MRA HEAD WITHOUT CONTRAST  Technique: Angiographic images of the Circle of Willis were obtained using MRA technique without  intravenous contrast.  Findings:  Artifact at the cervicomedullary junction. Antegrade flow in the posterior circulation with codominant distal vertebral arteries.  The no distal vertebral stenosis.  Normal vertebrobasilar junction.  No basilar stenosis.  Mild basilar tortuosity.  AICA vessel origins are patent.  SCA and PCA origins are patent.  Fetal type left PCA.  Bilateral PCA branches are within normal limits.  Left posterior communicating artery diminutive or absent.  Antegrade flow in both ICA siphons.  No ICA stenosis.  In the left cavernous ICA and left ICA terminus there is no flow signal in the region of the soft tissue mass (series 9 image 67).  Normal bilateral MCA origins.  Normal left ACA origin.  Hypoplastic right ACA A1 segment with an infundibulum at its origin.  Anterior communicating artery within normal limits.  Visualized ACA branches within normal limits.  Visualized bilateral MCA branches within normal limits.  IMPRESSION: 1.  No flow signal in the region of the left cavernous sinus mass, in keeping with the favored diagnosis of meningioma. 2.  Essentially negative intracranial MRA; hypoplastic right ACA A1 segment with an infundibulum at its origin.   Original Report Authenticated By: Randall An, M.D.    Nm Myocar Multi W/spect W/wall Motion / Ef  01/30/2012  71 year old female complaining of chest pain.  The study is performed to exclude ischemia.  This is a 2-day rest stress protocol.  30 mCi of Myoview were used for both the rest and stress images.  Lexiscan was administered in the typical fashion.  The patients resting heart rate was 86 and following infusion 88. The blood pressure at rest was 117/59 and following infusion 108/68.  The patient had chest pain both prior to this  study and during the infusion.  There were no ST changes.  The study was terminated per protocol.  Scintigraphic results: the images were reconstructed in the short axis as well as the vertical and horizontal long axis.  The stress images reveal a small defect in the distal anterior wall and apex. When compared to the rest images there is mild reversibility in that distribution.  The gated ejection fraction was 84% and the wall motion was normal.  End-systolic volume - 7 ml; end-diastolic volume - 48 ml.  T I D - 0.71.  Final interpretation:  Lexiscan Myoview with no diagnostic electrocardiographic changes.  The scintigraphic results show possible mild ischemia in the distal anterior wall and apex; the defect may also be related to shifting breast attenuation. The gated ejection fraction was 84% and the wall motion was normal.   Original Report Authenticated By: [place] [facility] W/cm  01/28/2012  *RADIOLOGY REPORT*  Clinical Data: Left-sided facial pain and fever.  Headache.  CT MAXILLOFACIAL WITH CONTRAST  Technique:  Multidetector CT imaging of the maxillofacial structures was performed with intravenous contrast. Multiplanar CT image reconstructions were also generated.  Contrast: 43m OMNIPAQUE IOHEXOL 300 MG/ML  SOLN  Comparison: Cervical spine radiographs 02/25/2007.  Findings: Visualization of some portions of the scan limited due to streak artifact arising from dental hardware.  Minimal infiltration in the subcutaneous fat over the left mandibular region is demonstrated.  No discrete fluid collection or contrast enhancement to suggest abscess.  Cervical lymph nodes are not pathologically enlarged.  Salivary glands appear symmetrical. No mucosal or prevertebral mass lesions appreciated.  Visualized jugular and carotid vessels appear patent.  No displacement of vessels or fat planes.  Visualized paranasal sinuses are not opacified.  Degenerative changes in the upper cervical spine. Degenerative  changes in the temporomandibular joints bilaterally with joint space narrowing, deformity of the mandibular heads, and subcortical cystic changes.  There is an expansile lesion with central hypo density laterally to the left of the pituitary fossa.  This could represent a mass lesion or aneurysm arising in the region of Meckel's cave.  The lesion measures about 11 x 12 x 7 mm.  MRI correlation is recommended.  IMPRESSION: Mass lesion or possibly aneurysm arising to the left of the pituitary fossa.  MRI recommended for further evaluation.  Minimal soft tissue infiltration on the left without evidence of abscess. Degenerative changes in the cervical spine and temporomandibular joints.   Original Report Authenticated By: WNeale Burly M.D.     Assessment and Plan: 89female with multiple medical problems including rheumatoid arthritis. Recently admitted to call him hospital with headaches pressure behind the eyes. MRI and MRA sheet was consistent with a meningioma of the left cavernous sinus. [facility] planning on doing Gamma knife for the meningioma. This is scheduled for next week.  Oncologic LTruman Haywardpatient has no further needs and I can certainly see her on an as-needed  basis. I have answered all of the questions that the patient and her daughter had we also reviewed her radiology.  The length of time of the face-to-face encounter was 45    minutes. More than 50% of time was spent counseling and coordination of care.  Marcy Panning, MD Medical/Oncology [facility] [phone] (beeper) [phone] (Office)  02/17/2012, 2:43 PM

## 2012-02-24 DIAGNOSIS — D320 Benign neoplasm of cerebral meninges: Secondary | ICD-10-CM | POA: Diagnosis not present

## 2012-02-26 DIAGNOSIS — N179 Acute kidney failure, unspecified: Secondary | ICD-10-CM | POA: Diagnosis not present

## 2012-03-11 DIAGNOSIS — C700 Malignant neoplasm of cerebral meninges: Secondary | ICD-10-CM | POA: Diagnosis not present

## 2012-03-11 DIAGNOSIS — D320 Benign neoplasm of cerebral meninges: Secondary | ICD-10-CM | POA: Diagnosis not present

## 2012-03-23 DIAGNOSIS — Z833 Family history of diabetes mellitus: Secondary | ICD-10-CM | POA: Diagnosis not present

## 2012-03-23 DIAGNOSIS — E1149 Type 2 diabetes mellitus with other diabetic neurological complication: Secondary | ICD-10-CM | POA: Diagnosis not present

## 2012-03-23 DIAGNOSIS — E1142 Type 2 diabetes mellitus with diabetic polyneuropathy: Secondary | ICD-10-CM | POA: Diagnosis not present

## 2012-03-23 DIAGNOSIS — M069 Rheumatoid arthritis, unspecified: Secondary | ICD-10-CM | POA: Diagnosis not present

## 2012-05-13 ENCOUNTER — Emergency Department (INDEPENDENT_AMBULATORY_CARE_PROVIDER_SITE_OTHER)
Admission: EM | Admit: 2012-05-13 | Discharge: 2012-05-13 | Disposition: A | Discharge status: Home / Self Care | Payer: Medicare Other | Source: Home / Self Care | Attending: Emergency Medicine | Admitting: Emergency Medicine

## 2012-05-13 ENCOUNTER — Emergency Department (HOSPITAL_COMMUNITY)
Admission: EM | Admit: 2012-05-13 | Discharge: 2012-05-14 | Disposition: A | Discharge status: Home / Self Care | Payer: Medicare Other | Attending: Emergency Medicine | Admitting: Emergency Medicine

## 2012-05-13 ENCOUNTER — Encounter (HOSPITAL_COMMUNITY): Payer: Self-pay | Admitting: *Deleted

## 2012-05-13 ENCOUNTER — Encounter (HOSPITAL_COMMUNITY): Payer: Self-pay | Admitting: Emergency Medicine

## 2012-05-13 DIAGNOSIS — Z79899 Other long term (current) drug therapy: Secondary | ICD-10-CM | POA: Insufficient documentation

## 2012-05-13 DIAGNOSIS — E7800 Pure hypercholesterolemia, unspecified: Secondary | ICD-10-CM | POA: Diagnosis not present

## 2012-05-13 DIAGNOSIS — E119 Type 2 diabetes mellitus without complications: Secondary | ICD-10-CM | POA: Diagnosis not present

## 2012-05-13 DIAGNOSIS — I10 Essential (primary) hypertension: Secondary | ICD-10-CM | POA: Insufficient documentation

## 2012-05-13 DIAGNOSIS — R209 Unspecified disturbances of skin sensation: Secondary | ICD-10-CM

## 2012-05-13 DIAGNOSIS — Z7982 Long term (current) use of aspirin: Secondary | ICD-10-CM | POA: Insufficient documentation

## 2012-05-13 DIAGNOSIS — M5412 Radiculopathy, cervical region: Secondary | ICD-10-CM | POA: Diagnosis not present

## 2012-05-13 DIAGNOSIS — IMO0002 Reserved for concepts with insufficient information to code with codable children: Secondary | ICD-10-CM | POA: Insufficient documentation

## 2012-05-13 DIAGNOSIS — R51 Headache: Secondary | ICD-10-CM | POA: Diagnosis not present

## 2012-05-13 DIAGNOSIS — Z8739 Personal history of other diseases of the musculoskeletal system and connective tissue: Secondary | ICD-10-CM | POA: Diagnosis not present

## 2012-05-13 DIAGNOSIS — E039 Hypothyroidism, unspecified: Secondary | ICD-10-CM | POA: Insufficient documentation

## 2012-05-13 DIAGNOSIS — R202 Paresthesia of skin: Secondary | ICD-10-CM

## 2012-05-13 DIAGNOSIS — M47812 Spondylosis without myelopathy or radiculopathy, cervical region: Secondary | ICD-10-CM | POA: Diagnosis not present

## 2012-05-13 NOTE — ED Notes (Signed)
C/o right arm numbness and burning which started yesterday.  No therapy completed.

## 2012-05-13 NOTE — ED Notes (Signed)
Pt c/o right arm pain and intermittent numbness.

## 2012-05-13 NOTE — ED Notes (Signed)
The pt has pain and numbness in her rt  Forearm from her fingers up to her elbow.  No known injury.  Worse when lying down.  No arm drift.  No facial droop

## 2012-05-13 NOTE — ED Provider Notes (Signed)
Chief Complaint  Patient presents with  . Numbness    History of Present Illness:   The patient is a 71 year old female who has had a two-day history of numbness and tingling of her right arm and hand that radiates up to the elbow. There is a burning sensation in her fingers but she denies any pain. The arm feels weak as well as numb. The patient has a headache on the left side. She was initially seen at the hospital with some neurological symptoms and diagnosed with a brain tumor. She was seen at [facility] in October and had gamma knife surgery done. They think this is been successful within a short period she hasn't had any fever or stiff neck. She denies any changes in her vision. No difficulty speaking. She denies any weakness or numbness in her legs or tingling.  Review of Systems:  Other than noted above, the patient denies any of the following symptoms: Systemic:  No fever, chills, fatigue, photophobia, stiff neck. Eye:  No redness, eye pain, discharge, blurred vision, or diplopia. ENT:  No nasal congestion, rhinorrhea, sinus pressure or pain, sneezing, earache, or sore throat.  No jaw claudication. Neuro:  No paresthesias, loss of consciousness, seizure activity, muscle weakness, trouble with coordination or gait, trouble speaking or swallowing. Psych:  No depression, anxiety or trouble sleeping.  [place]:  Past medical history, family history, social history, meds, and allergies were reviewed.  Physical Exam:   Vital signs:  BP 128/79  Pulse 89  Temp 97.7 F (36.5 C) (Oral)  Resp 18  SpO2 96% General:  Alert and oriented.  In no distress. Eye:  Lids and conjunctivas normal.  PERRL,  Full EOMs.  Fundi benign with normal discs and vessels. ENT:  No cranial or facial tenderness to palpation.  TMs and canals clear.  Nasal mucosa was normal and uncongested without any drainage. No intra oral lesions, pharynx clear, mucous membranes moist, dentition normal. Neck:   Supple, full ROM, no tenderness to palpation.  No adenopathy or mass. No carotid bruit. Lungs: Clear to auscultation. Heart: Regular rhythm, no gallop or murmur. Neuro:  Alert and orented times 3.  Speech was clear, fluent, and appropriate.  Cranial nerves intact. No pronator drift, muscle strength normal. Finger to nose normal.  DTRs were 2+ and symmetrical.Station and gait were normal.  Romberg's sign was normal.  Able to perform tandem gait well. She has intact sensation to light touch in all the fingers of her right hand but there is loss of 2 point discrimination over the little finger and ring finger the right hand. Muscle strength was normal in and equal in both hands. Psych:  Normal affect.  Assessment:  The encounter diagnosis was Paresthesia.  She has new paresthesias and a history of brain tumor and recent gamma knife surgery. I think she needs to be seen at the hospital for further evaluation. She may have extension of her tumor, or a complication of the surgery such as an intracranial bleed.  Plan:   1.  The following meds were prescribed:   New Prescriptions   No medications on file   2.  The patient was transferred to the hospital via shuttle.   Harden Mo, MD 05/13/12 2137

## 2012-05-14 ENCOUNTER — Emergency Department (HOSPITAL_COMMUNITY): Payer: Medicare Other

## 2012-05-14 ENCOUNTER — Encounter (HOSPITAL_COMMUNITY): Payer: Self-pay | Admitting: Radiology

## 2012-05-14 DIAGNOSIS — M47812 Spondylosis without myelopathy or radiculopathy, cervical region: Secondary | ICD-10-CM | POA: Diagnosis not present

## 2012-05-14 DIAGNOSIS — R51 Headache: Secondary | ICD-10-CM | POA: Diagnosis not present

## 2012-05-14 LAB — CBC
MCH: 28.3 pg (ref 26.0–34.0)
MCHC: 30.9 g/dL (ref 30.0–36.0)
MCV: 91.8 fL (ref 78.0–100.0)
Platelets: 284 10*3/uL (ref 150–400)
RDW: 13.6 % (ref 11.5–15.5)
WBC: 9.8 10*3/uL (ref 4.0–10.5)

## 2012-05-14 LAB — POCT I-STAT, CHEM 8
HCT: 38 % (ref 36.0–46.0)
Hemoglobin: 12.9 g/dL (ref 12.0–15.0)
Potassium: 4.1 mEq/L (ref 3.5–5.1)
Sodium: 142 mEq/L (ref 135–145)
TCO2: 31 mmol/L (ref 0–100)

## 2012-05-14 MED ORDER — HYDROCODONE-ACETAMINOPHEN 5-325 MG PO TABS: 1.0000 | ORAL_TABLET | Freq: Four times a day (QID) | ORAL | Status: DC | PRN

## 2012-05-14 MED ORDER — FENTANYL CITRATE 0.05 MG/ML IJ SOLN
50.0000 ug | INTRAMUSCULAR | Status: DC | PRN
  Administered 2012-05-14: 50 ug via INTRAVENOUS
  Filled 2012-05-14: qty 2

## 2012-05-14 MED ORDER — ONDANSETRON HCL 4 MG/2ML IJ SOLN
4.0000 mg | Freq: Once | INTRAMUSCULAR | Status: AC
  Administered 2012-05-14: 4 mg via INTRAVENOUS
  Filled 2012-05-14: qty 2

## 2012-05-14 NOTE — ED Provider Notes (Signed)
History     CSN: [id number]  Arrival date & time 05/13/12  1816   First MD Initiated Contact with Patient 05/13/12 2345      Chief Complaint  Patient presents with  . Arm Pain    (Consider location/radiation/quality/duration/timing/severity/associated sxs/prior treatment) HPI History provided by patient. Right arm burning pain with numbness. No associated neck pain. No trauma. No history of same. The started 2 days ago, seems to be worse when lying down. No associated weakness. Moderate in severity. Having some mild left frontal headaches similar to when she was diagnosed with meningioma requiring gamma knife procedure at [facility] 2 months ago. Patient is concerned she is having complications related to that. Past Medical History  Diagnosis Date  . Diabetes mellitus   . Hypertension   . Thyroid disease   . Arthritis   . High cholesterol   . 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 Abdominal hysterectomy   . Ovary surgery   . Shoulder surgery     Family History  Problem Relation Age of Onset  . Diabetes Mother   . Hypertension Father   . Diabetes Sister   . Diabetes Brother   . Hypertension Brother   . Heart attack Mother   . Lung cancer Father     History  Substance Use Topics  . Smoking status: Never Smoker   . Smokeless tobacco: Not on file  . Alcohol Use: No    OB History    Grav Para Term Preterm Abortions TAB SAB Ect Mult Living                  Review of Systems  Constitutional: Negative for fever and chills.  HENT: Negative for neck pain and neck stiffness.   Eyes: Negative for pain.  Respiratory: Negative for shortness of breath.   Cardiovascular: Negative for chest pain.  Gastrointestinal: Negative for abdominal pain.  Genitourinary: Negative for dysuria.  Musculoskeletal: Negative for back pain, joint swelling and gait problem.  Skin: Negative for rash.    Neurological: Positive for headaches.  All other systems reviewed and are negative.    Allergies  Codeine; Remicade; and Zestril  Home Medications   Current Outpatient Rx  Name  Route  Sig  Dispense  Refill  . ACETAMINOPHEN 500 MG PO TABS   Oral   Take 1,000 mg by mouth 2 (two) times daily. Takes with tramadol         . ASPIRIN 81 MG PO TBEC   Oral   Take 81 mg by mouth daily.         . ATORVASTATIN CALCIUM 20 MG PO TABS   Oral   Take 20 mg by mouth daily.         . DULOXETINE HCL 60 MG PO CPEP   Oral   Take 60 mg by mouth daily.         [name] GABAPENTIN 600 MG PO TABS   Oral   Take 1,200-1,800 mg by mouth 3 (three) times daily. Take 2 tablets in the morning, 2 tablets in the middle of the day, and 3 tablets at night         . HYDROXYCHLOROQUINE SULFATE 200 MG PO TABS   Oral   Take 200 mg by mouth 2 (two) times daily.          [name] LEVOTHYROXINE SODIUM 50 MCG PO TABS   Oral  Take 50 mcg by mouth daily.         [name] LINAGLIPTIN 5 MG PO TABS   Oral   Take 5 mg by mouth daily.         [name] METHOTREXATE (ANTI-RHEUMATIC) 2.5 MG PO TABS   Oral   Take 7.5 mg by mouth once a week. Takes on wednesdays         . METOPROLOL SUCCINATE ER 100 MG PO TB24   Oral   Take 100 mg by mouth daily. Take with or immediately following a meal.         . OLMESARTAN MEDOXOMIL 40 MG PO TABS   Oral   Take 40 mg by mouth daily.         [name] PREDNISONE 5 MG PO TABS   Oral   Take 5 mg by mouth daily.         [name] SPIRONOLACTONE 25 MG PO TABS   Oral   Take 25 mg by mouth.         . TRAMADOL HCL ER 100 MG PO TB24   Oral   Take 100 mg by mouth 2 (two) times daily.           BP 117/66  Pulse 86  Temp 97.5 F (36.4 C) (Oral)  Resp 20  SpO2 94%  Physical Exam  Constitutional: She is oriented to person, place, and time. She appears well-developed and well-nourished.  HENT:  Head: Normocephalic and atraumatic.  Eyes: Conjunctivae normal and EOM are normal. Pupils  are equal, round, and reactive to light.  Neck: Full passive range of motion without pain. Neck supple. No thyromegaly present.       No midline cervical tenderness or deformity. No reproducible symptoms with lateral rotation and compression of the cervical spine  Cardiovascular: Normal rate, regular rhythm, S1 normal, S2 normal and intact distal pulses.   Pulmonary/Chest: Effort normal and breath sounds normal.  Abdominal: Soft. Bowel sounds are normal. There is no tenderness. There is no CVA tenderness.  Musculoskeletal: Normal range of motion. She exhibits no edema and no tenderness.       Equal grips and biceps triceps strengths. Sensorium to light touch equal and intact x4. No measurable deficits. Equal radial pulses.  Neurological: She is alert and oriented to person, place, and time. She has normal strength and normal reflexes. No cranial nerve deficit or sensory deficit. She displays a negative Romberg sign. GCS eye subscore is 4. GCS verbal subscore is 5. GCS motor subscore is 6.       Normal Gait  Skin: Skin is warm and dry. No rash noted. No cyanosis. Nails show no clubbing.  Psychiatric: She has a normal mood and affect. Her speech is normal and behavior is normal.    ED Course  Procedures (including critical care time)  Labs Reviewed  CBC - Abnormal; Notable for the following:    Hemoglobin 11.7 (*)     All other components within normal limits  POCT I-STAT, CHEM 8 - Abnormal; Notable for the following:    Creatinine, Ser 1.60 (*)     Glucose, Bld 104 (*)     All other components within normal limits   Ct Head Wo Contrast  05/14/2012  *RADIOLOGY REPORT*  Clinical Data:  Pain and numbness in right forearm, worse when lying down.  Concern for head or cervical spine abnormality.  CT HEAD WITHOUT CONTRAST AND CT CERVICAL SPINE WITHOUT CONTRAST  Technique:  Multidetector CT imaging of  the head and cervical spine was performed following the standard protocol without intravenous  contrast.  Multiplanar CT image reconstructions of the cervical spine were also generated.  Comparison: Cervical spine radiographs performed 02/25/2007, MRI/MRA of the brain performed 01/28/2012, and CT of the maxillofacial structures performed 01/28/2012  CT HEAD  Findings: There is no evidence of acute infarction, or intra- or extra-axial hemorrhage on CT.  A 1.2 cm mass is again noted at the left cavernous sinus, with posterior extension off the posterior clinoid process.  As on prior MRI, this is most compatible with a cavernous sinus meningioma.  Scattered chronic ischemic change is noted within the basal ganglia bilaterally.  The posterior fossa, including the cerebellum, brainstem and fourth ventricle, is within normal limits.  The third and lateral ventricles are unremarkable in appearance.  The cerebral hemispheres are symmetric in appearance, with normal gray-white differentiation.  No midline shift is seen.  There is no evidence of fracture; visualized osseous structures are unremarkable in appearance.  The orbits are within normal limits. The paranasal sinuses and mastoid air cells are well-aerated.  No significant soft tissue abnormalities are seen.  IMPRESSION:  1.  No acute intracranial pathology seen on CT. 2.  Stable 1.2 cm mass at the left cavernous sinus, with posterior extension off the posterior clinoid process.  This is most compatible with a cavernous sinus meningioma, as noted on prior MRI. 3.  Scattered chronic ischemic change within the basal ganglia bilaterally.  CT CERVICAL SPINE  Findings: There is no evidence of fracture or subluxation. Vertebral bodies demonstrate normal height and alignment. Intervertebral disc spaces are preserved.  Prevertebral soft tissues are within normal limits.  Large anterior osteophytes are seen along the cervical spine, and there are scattered tiny posterior disc osteophyte complexes at multiple levels.  The thyroid gland is unremarkable in appearance.  The  visualized lung apices are clear.  No significant soft tissue abnormalities are seen.  IMPRESSION:  1.  No evidence of fracture or subluxation along the cervical spine. 2.  Mild degenerative change along the cervical spine.  The bony foramina appear intact bilaterally, and there is no evidence of narrowing of the spinal canal, though disc osteophyte complexes are not well assessed on CT.   Original Report Authenticated By: Santa Lighter, M.D.    Ct Cervical Spine Wo Contrast  05/14/2012  *RADIOLOGY REPORT*  Clinical Data:  Pain and numbness in right forearm, worse when lying down.  Concern for head or cervical spine abnormality.  CT HEAD WITHOUT CONTRAST AND CT CERVICAL SPINE WITHOUT CONTRAST  Technique:  Multidetector CT imaging of the head and cervical spine was performed following the standard protocol without intravenous contrast.  Multiplanar CT image reconstructions of the cervical spine were also generated.  Comparison: Cervical spine radiographs performed 02/25/2007, MRI/MRA of the brain performed 01/28/2012, and CT of the maxillofacial structures performed 01/28/2012  CT HEAD  Findings: There is no evidence of acute infarction, or intra- or extra-axial hemorrhage on CT.  A 1.2 cm mass is again noted at the left cavernous sinus, with posterior extension off the posterior clinoid process.  As on prior MRI, this is most compatible with a cavernous sinus meningioma.  Scattered chronic ischemic change is noted within the basal ganglia bilaterally.  The posterior fossa, including the cerebellum, brainstem and fourth ventricle, is within normal limits.  The third and lateral ventricles are unremarkable in appearance.  The cerebral hemispheres are symmetric in appearance, with normal gray-white differentiation.  No midline shift  is seen.  There is no evidence of fracture; visualized osseous structures are unremarkable in appearance.  The orbits are within normal limits. The paranasal sinuses and mastoid air  cells are well-aerated.  No significant soft tissue abnormalities are seen.  IMPRESSION:  1.  No acute intracranial pathology seen on CT. 2.  Stable 1.2 cm mass at the left cavernous sinus, with posterior extension off the posterior clinoid process.  This is most compatible with a cavernous sinus meningioma, as noted on prior MRI. 3.  Scattered chronic ischemic change within the basal ganglia bilaterally.  CT CERVICAL SPINE  Findings: There is no evidence of fracture or subluxation. Vertebral bodies demonstrate normal height and alignment. Intervertebral disc spaces are preserved.  Prevertebral soft tissues are within normal limits.  Large anterior osteophytes are seen along the cervical spine, and there are scattered tiny posterior disc osteophyte complexes at multiple levels.  The thyroid gland is unremarkable in appearance.  The visualized lung apices are clear.  No significant soft tissue abnormalities are seen.  IMPRESSION:  1.  No evidence of fracture or subluxation along the cervical spine. 2.  Mild degenerative change along the cervical spine.  The bony foramina appear intact bilaterally, and there is no evidence of narrowing of the spinal canal, though disc osteophyte complexes are not well assessed on CT.   Original Report Authenticated By: Santa Lighter, M.D.    IV fentanyl. IV Zofran.  Recheck symptoms improved. Plan followup with him on [facility] as scheduled. Pain medications provided.  MDM   Cervical radiculopathy symptoms with CT cervical spine reveals above. Patient also having mild headaches with history of meningioma and CT scan reviewed as above. CT results discussed with radiologist and shared with patient. labs are reviewed above. Symptoms improved with IV narcotics. Vital signs and nursing notes reviewed.        Teressa Lower, MD 05/14/12 [phone]

## 2012-05-14 NOTE — ED Notes (Signed)
Patient transported to CT. 

## 2012-05-14 NOTE — ED Notes (Signed)
MD at bedside. 

## 2012-05-21 ENCOUNTER — Other Ambulatory Visit: Payer: Self-pay | Admitting: Internal Medicine

## 2012-05-21 DIAGNOSIS — Z1231 Encounter for screening mammogram for malignant neoplasm of breast: Secondary | ICD-10-CM

## 2012-05-28 DIAGNOSIS — E1142 Type 2 diabetes mellitus with diabetic polyneuropathy: Secondary | ICD-10-CM | POA: Diagnosis not present

## 2012-05-28 DIAGNOSIS — Z79899 Other long term (current) drug therapy: Secondary | ICD-10-CM | POA: Diagnosis not present

## 2012-05-28 DIAGNOSIS — R609 Edema, unspecified: Secondary | ICD-10-CM | POA: Diagnosis not present

## 2012-05-28 DIAGNOSIS — R0602 Shortness of breath: Secondary | ICD-10-CM | POA: Diagnosis not present

## 2012-05-28 DIAGNOSIS — E1149 Type 2 diabetes mellitus with other diabetic neurological complication: Secondary | ICD-10-CM | POA: Diagnosis not present

## 2012-06-18 DIAGNOSIS — R5381 Other malaise: Secondary | ICD-10-CM | POA: Diagnosis not present

## 2012-06-18 DIAGNOSIS — M25579 Pain in unspecified ankle and joints of unspecified foot: Secondary | ICD-10-CM | POA: Diagnosis not present

## 2012-06-18 DIAGNOSIS — M2550 Pain in unspecified joint: Secondary | ICD-10-CM | POA: Diagnosis not present

## 2012-06-18 DIAGNOSIS — M19049 Primary osteoarthritis, unspecified hand: Secondary | ICD-10-CM | POA: Diagnosis not present

## 2012-06-18 DIAGNOSIS — M1710 Unilateral primary osteoarthritis, unspecified knee: Secondary | ICD-10-CM | POA: Diagnosis not present

## 2012-06-18 DIAGNOSIS — R300 Dysuria: Secondary | ICD-10-CM | POA: Diagnosis not present

## 2012-06-18 DIAGNOSIS — Z79899 Other long term (current) drug therapy: Secondary | ICD-10-CM | POA: Diagnosis not present

## 2012-06-18 DIAGNOSIS — Z09 Encounter for follow-up examination after completed treatment for conditions other than malignant neoplasm: Secondary | ICD-10-CM | POA: Diagnosis not present

## 2012-06-18 DIAGNOSIS — R5383 Other fatigue: Secondary | ICD-10-CM | POA: Diagnosis not present

## 2012-06-18 DIAGNOSIS — M069 Rheumatoid arthritis, unspecified: Secondary | ICD-10-CM | POA: Diagnosis not present

## 2012-06-22 ENCOUNTER — Ambulatory Visit
Admission: RE | Admit: 2012-06-22 | Discharge: 2012-06-22 | Disposition: A | Discharge status: Home / Self Care | Payer: Medicare Other | Source: Ambulatory Visit | Attending: Internal Medicine | Admitting: Internal Medicine

## 2012-06-22 DIAGNOSIS — Z1231 Encounter for screening mammogram for malignant neoplasm of breast: Secondary | ICD-10-CM | POA: Diagnosis not present

## 2012-06-23 DIAGNOSIS — E1142 Type 2 diabetes mellitus with diabetic polyneuropathy: Secondary | ICD-10-CM | POA: Diagnosis not present

## 2012-06-23 DIAGNOSIS — M2559 Pain in other specified joint: Secondary | ICD-10-CM | POA: Diagnosis not present

## 2012-06-23 DIAGNOSIS — E1149 Type 2 diabetes mellitus with other diabetic neurological complication: Secondary | ICD-10-CM | POA: Diagnosis not present

## 2012-06-23 DIAGNOSIS — S8000XA Contusion of unspecified knee, initial encounter: Secondary | ICD-10-CM | POA: Diagnosis not present

## 2012-06-28 DIAGNOSIS — R0602 Shortness of breath: Secondary | ICD-10-CM | POA: Diagnosis not present

## 2012-06-28 DIAGNOSIS — R079 Chest pain, unspecified: Secondary | ICD-10-CM | POA: Diagnosis not present

## 2012-06-28 DIAGNOSIS — I10 Essential (primary) hypertension: Secondary | ICD-10-CM | POA: Diagnosis not present

## 2012-07-07 DIAGNOSIS — R0609 Other forms of dyspnea: Secondary | ICD-10-CM | POA: Diagnosis not present

## 2012-07-07 DIAGNOSIS — R0989 Other specified symptoms and signs involving the circulatory and respiratory systems: Secondary | ICD-10-CM | POA: Diagnosis not present

## 2012-07-08 DIAGNOSIS — M6281 Muscle weakness (generalized): Secondary | ICD-10-CM | POA: Diagnosis not present

## 2012-07-08 DIAGNOSIS — M2550 Pain in unspecified joint: Secondary | ICD-10-CM | POA: Diagnosis not present

## 2012-07-09 DIAGNOSIS — R079 Chest pain, unspecified: Secondary | ICD-10-CM | POA: Diagnosis not present

## 2012-07-09 DIAGNOSIS — R0609 Other forms of dyspnea: Secondary | ICD-10-CM | POA: Diagnosis not present

## 2012-07-09 DIAGNOSIS — R0989 Other specified symptoms and signs involving the circulatory and respiratory systems: Secondary | ICD-10-CM | POA: Diagnosis not present

## 2012-07-12 ENCOUNTER — Emergency Department (HOSPITAL_COMMUNITY): Payer: Medicare Other

## 2012-07-12 ENCOUNTER — Inpatient Hospital Stay (HOSPITAL_COMMUNITY)
Admission: EM | Admit: 2012-07-12 | Discharge: 2012-07-15 | DRG: 552 | Disposition: A | Discharge status: Rehabilitation Facility | Payer: Medicare Other | Attending: Internal Medicine | Admitting: Internal Medicine

## 2012-07-12 ENCOUNTER — Encounter (HOSPITAL_COMMUNITY): Payer: Self-pay | Admitting: Emergency Medicine

## 2012-07-12 DIAGNOSIS — M48062 Spinal stenosis, lumbar region with neurogenic claudication: Principal | ICD-10-CM | POA: Diagnosis present

## 2012-07-12 DIAGNOSIS — R262 Difficulty in walking, not elsewhere classified: Secondary | ICD-10-CM | POA: Diagnosis not present

## 2012-07-12 DIAGNOSIS — M79609 Pain in unspecified limb: Secondary | ICD-10-CM | POA: Diagnosis not present

## 2012-07-12 DIAGNOSIS — R079 Chest pain, unspecified: Secondary | ICD-10-CM | POA: Diagnosis not present

## 2012-07-12 DIAGNOSIS — Z888 Allergy status to other drugs, medicaments and biological substances status: Secondary | ICD-10-CM | POA: Diagnosis not present

## 2012-07-12 DIAGNOSIS — I10 Essential (primary) hypertension: Secondary | ICD-10-CM | POA: Diagnosis not present

## 2012-07-12 DIAGNOSIS — S79919A Unspecified injury of unspecified hip, initial encounter: Secondary | ICD-10-CM | POA: Diagnosis not present

## 2012-07-12 DIAGNOSIS — Z7982 Long term (current) use of aspirin: Secondary | ICD-10-CM | POA: Diagnosis not present

## 2012-07-12 DIAGNOSIS — Z5189 Encounter for other specified aftercare: Secondary | ICD-10-CM | POA: Diagnosis not present

## 2012-07-12 DIAGNOSIS — M48061 Spinal stenosis, lumbar region without neurogenic claudication: Secondary | ICD-10-CM | POA: Diagnosis present

## 2012-07-12 DIAGNOSIS — F3289 Other specified depressive episodes: Secondary | ICD-10-CM | POA: Diagnosis present

## 2012-07-12 DIAGNOSIS — M545 Low back pain, unspecified: Secondary | ICD-10-CM | POA: Diagnosis present

## 2012-07-12 DIAGNOSIS — Z8249 Family history of ischemic heart disease and other diseases of the circulatory system: Secondary | ICD-10-CM | POA: Diagnosis not present

## 2012-07-12 DIAGNOSIS — S8990XA Unspecified injury of unspecified lower leg, initial encounter: Secondary | ICD-10-CM | POA: Diagnosis not present

## 2012-07-12 DIAGNOSIS — Z79899 Other long term (current) drug therapy: Secondary | ICD-10-CM | POA: Diagnosis not present

## 2012-07-12 DIAGNOSIS — M5137 Other intervertebral disc degeneration, lumbosacral region: Secondary | ICD-10-CM | POA: Diagnosis not present

## 2012-07-12 DIAGNOSIS — R8271 Bacteriuria: Secondary | ICD-10-CM | POA: Diagnosis present

## 2012-07-12 DIAGNOSIS — M5416 Radiculopathy, lumbar region: Secondary | ICD-10-CM | POA: Diagnosis present

## 2012-07-12 DIAGNOSIS — E7800 Pure hypercholesterolemia, unspecified: Secondary | ICD-10-CM | POA: Diagnosis present

## 2012-07-12 DIAGNOSIS — F329 Major depressive disorder, single episode, unspecified: Secondary | ICD-10-CM | POA: Diagnosis present

## 2012-07-12 DIAGNOSIS — M4310 Spondylolisthesis, site unspecified: Secondary | ICD-10-CM | POA: Diagnosis not present

## 2012-07-12 DIAGNOSIS — E119 Type 2 diabetes mellitus without complications: Secondary | ICD-10-CM | POA: Diagnosis present

## 2012-07-12 DIAGNOSIS — IMO0002 Reserved for concepts with insufficient information to code with codable children: Secondary | ICD-10-CM

## 2012-07-12 DIAGNOSIS — R82998 Other abnormal findings in urine: Secondary | ICD-10-CM | POA: Diagnosis present

## 2012-07-12 DIAGNOSIS — Z833 Family history of diabetes mellitus: Secondary | ICD-10-CM | POA: Diagnosis not present

## 2012-07-12 DIAGNOSIS — M069 Rheumatoid arthritis, unspecified: Secondary | ICD-10-CM | POA: Diagnosis present

## 2012-07-12 DIAGNOSIS — E039 Hypothyroidism, unspecified: Secondary | ICD-10-CM | POA: Diagnosis present

## 2012-07-12 DIAGNOSIS — R29898 Other symptoms and signs involving the musculoskeletal system: Secondary | ICD-10-CM | POA: Diagnosis present

## 2012-07-12 DIAGNOSIS — D320 Benign neoplasm of cerebral meninges: Secondary | ICD-10-CM | POA: Diagnosis present

## 2012-07-12 DIAGNOSIS — M25569 Pain in unspecified knee: Secondary | ICD-10-CM | POA: Diagnosis not present

## 2012-07-12 DIAGNOSIS — E1149 Type 2 diabetes mellitus with other diabetic neurological complication: Secondary | ICD-10-CM | POA: Diagnosis not present

## 2012-07-12 DIAGNOSIS — R6889 Other general symptoms and signs: Secondary | ICD-10-CM | POA: Diagnosis not present

## 2012-07-12 DIAGNOSIS — M47817 Spondylosis without myelopathy or radiculopathy, lumbosacral region: Secondary | ICD-10-CM | POA: Diagnosis not present

## 2012-07-12 DIAGNOSIS — G4700 Insomnia, unspecified: Secondary | ICD-10-CM | POA: Diagnosis present

## 2012-07-12 DIAGNOSIS — D329 Benign neoplasm of meninges, unspecified: Secondary | ICD-10-CM | POA: Diagnosis present

## 2012-07-12 DIAGNOSIS — M25559 Pain in unspecified hip: Secondary | ICD-10-CM | POA: Diagnosis not present

## 2012-07-12 HISTORY — DX: Major depressive disorder, single episode, unspecified: F32.9

## 2012-07-12 HISTORY — DX: Contusion of left knee, initial encounter: S80.02XA

## 2012-07-12 HISTORY — DX: Rheumatoid arthritis, unspecified: M06.9

## 2012-07-12 LAB — URINE MICROSCOPIC-ADD ON

## 2012-07-12 LAB — CBC WITH DIFFERENTIAL/PLATELET
Basophils Relative: 0 % (ref 0–1)
Eosinophils Absolute: 0.4 10*3/uL (ref 0.0–0.7)
Hemoglobin: 10.1 g/dL — ABNORMAL LOW (ref 12.0–15.0)
Lymphs Abs: 0.9 10*3/uL (ref 0.7–4.0)
MCH: 27.1 pg (ref 26.0–34.0)
Monocytes Relative: 10 % (ref 3–12)
Neutro Abs: 7.4 10*3/uL (ref 1.7–7.7)
Neutrophils Relative %: 76 % (ref 43–77)
Platelets: 260 10*3/uL (ref 150–400)
RBC: 3.73 MIL/uL — ABNORMAL LOW (ref 3.87–5.11)

## 2012-07-12 LAB — BASIC METABOLIC PANEL
BUN: 15 mg/dL (ref 6–23)
Chloride: 98 mEq/L (ref 96–112)
GFR calc Af Amer: 66 mL/min — ABNORMAL LOW (ref >90)
GFR calc non Af Amer: 57 mL/min — ABNORMAL LOW (ref >90)
Potassium: 3.9 mEq/L (ref 3.5–5.1)
Sodium: 136 mEq/L (ref 135–145)

## 2012-07-12 LAB — URINALYSIS, ROUTINE W REFLEX MICROSCOPIC
Leukocytes, UA: NEGATIVE
Nitrite: NEGATIVE
Specific Gravity, Urine: 1.021 (ref 1.005–1.030)
Urobilinogen, UA: 1 mg/dL (ref 0.0–1.0)
pH: 5 (ref 5.0–8.0)

## 2012-07-12 LAB — CK: Total CK: 57 U/L (ref 7–177)

## 2012-07-12 MED ORDER — MORPHINE SULFATE 2 MG/ML IJ SOLN
1.0000 mg | INTRAMUSCULAR | Status: DC | PRN
  Administered 2012-07-12 – 2012-07-13 (×3): 1 mg via INTRAVENOUS
  Filled 2012-07-12 (×4): qty 1

## 2012-07-12 MED ORDER — IBUPROFEN 800 MG PO TABS
800.0000 mg | ORAL_TABLET | Freq: Once | ORAL | Status: AC
  Administered 2012-07-12: 800 mg via ORAL
  Filled 2012-07-12: qty 1

## 2012-07-12 MED ORDER — SODIUM CHLORIDE 0.9 % IJ SOLN
3.0000 mL | INTRAMUSCULAR | Status: DC | PRN
  Administered 2012-07-15: 3 mL via INTRAVENOUS

## 2012-07-12 MED ORDER — GABAPENTIN 600 MG PO TABS: 1200.0000 mg | ORAL_TABLET | Freq: Three times a day (TID) | ORAL | Status: DC

## 2012-07-12 MED ORDER — DULOXETINE HCL 60 MG PO CPEP
60.0000 mg | ORAL_CAPSULE | Freq: Every day | ORAL | Status: DC
  Administered 2012-07-13 – 2012-07-15 (×3): 60 mg via ORAL
  Filled 2012-07-12 (×3): qty 1

## 2012-07-12 MED ORDER — SODIUM CHLORIDE 0.9 % IV SOLN: 250.0000 mL | INTRAVENOUS | Status: DC | PRN

## 2012-07-12 MED ORDER — HYDROXYCHLOROQUINE SULFATE 200 MG PO TABS
200.0000 mg | ORAL_TABLET | Freq: Two times a day (BID) | ORAL | Status: DC
  Administered 2012-07-13 – 2012-07-15 (×5): 200 mg via ORAL
  Filled 2012-07-12 (×6): qty 1

## 2012-07-12 MED ORDER — ATORVASTATIN CALCIUM 20 MG PO TABS
20.0000 mg | ORAL_TABLET | Freq: Every day | ORAL | Status: DC
  Administered 2012-07-13 – 2012-07-14 (×2): 20 mg via ORAL
  Filled 2012-07-12 (×3): qty 1

## 2012-07-12 MED ORDER — GABAPENTIN 600 MG PO TABS
1800.0000 mg | ORAL_TABLET | Freq: Every day | ORAL | Status: DC
  Administered 2012-07-12 – 2012-07-14 (×3): 1800 mg via ORAL
  Filled 2012-07-12 (×4): qty 3

## 2012-07-12 MED ORDER — SODIUM CHLORIDE 0.9 % IJ SOLN
3.0000 mL | Freq: Two times a day (BID) | INTRAMUSCULAR | Status: DC
  Administered 2012-07-13 – 2012-07-14 (×4): 3 mL via INTRAVENOUS

## 2012-07-12 MED ORDER — FUROSEMIDE 20 MG PO TABS: 20.0000 mg | ORAL_TABLET | ORAL | Status: DC

## 2012-07-12 MED ORDER — METOPROLOL SUCCINATE ER 100 MG PO TB24
100.0000 mg | ORAL_TABLET | Freq: Every day | ORAL | Status: DC
  Administered 2012-07-13 – 2012-07-15 (×3): 100 mg via ORAL
  Filled 2012-07-12 (×3): qty 1

## 2012-07-12 MED ORDER — LINAGLIPTIN 5 MG PO TABS
5.0000 mg | ORAL_TABLET | Freq: Every day | ORAL | Status: DC
  Administered 2012-07-13 – 2012-07-15 (×3): 5 mg via ORAL
  Filled 2012-07-12 (×3): qty 1

## 2012-07-12 MED ORDER — PREDNISONE 20 MG PO TABS
20.0000 mg | ORAL_TABLET | Freq: Two times a day (BID) | ORAL | Status: DC
  Administered 2012-07-13: 20 mg via ORAL
  Filled 2012-07-12 (×3): qty 1

## 2012-07-12 MED ORDER — LEVOTHYROXINE SODIUM 50 MCG PO TABS
50.0000 ug | ORAL_TABLET | Freq: Every day | ORAL | Status: DC
  Administered 2012-07-13 – 2012-07-15 (×3): 50 ug via ORAL
  Filled 2012-07-12 (×4): qty 1

## 2012-07-12 MED ORDER — OXYCODONE-ACETAMINOPHEN 5-325 MG PO TABS
1.0000 | ORAL_TABLET | Freq: Once | ORAL | Status: AC
  Administered 2012-07-12: 1 via ORAL
  Filled 2012-07-12: qty 1

## 2012-07-12 MED ORDER — FUROSEMIDE 20 MG PO TABS
20.0000 mg | ORAL_TABLET | ORAL | Status: DC
  Administered 2012-07-13 – 2012-07-15 (×2): 20 mg via ORAL
  Filled 2012-07-12 (×2): qty 1

## 2012-07-12 MED ORDER — GABAPENTIN 600 MG PO TABS
1200.0000 mg | ORAL_TABLET | Freq: Two times a day (BID) | ORAL | Status: DC
  Administered 2012-07-13 – 2012-07-15 (×5): 1200 mg via ORAL
  Filled 2012-07-12 (×8): qty 2

## 2012-07-12 MED ORDER — FUROSEMIDE 20 MG PO TABS
20.0000 mg | ORAL_TABLET | ORAL | Status: DC
  Administered 2012-07-14 (×2): 20 mg via ORAL
  Filled 2012-07-12 (×3): qty 1

## 2012-07-12 NOTE — ED Provider Notes (Signed)
Pt relates she fell and injured her left knee and was ambulatory but was favoring her left knee. She now has pain radiating from her left foot into her back and her left flank. She also states when she stands her right knee gives out on her.  PT has some mild tenderness in her lumbar spine and her Left SIJ, she has some diffuse swelling of her left knee, no swelling in her right knee.   Medical screening examination/treatment/procedure(s) were conducted as a shared visit with non-physician practitioner(s) and myself.  I personally evaluated the patient during the encounter  Rolland Porter, MD, Alanson Aly, MD 07/12/12 [id number]

## 2012-07-12 NOTE — ED Provider Notes (Signed)
History     CSN: [id number]  Arrival date & time 07/12/12  1003   First MD Initiated Contact with Patient 07/12/12 1010      Chief Complaint  Patient presents with  . Hip Pain    (Consider location/radiation/quality/duration/timing/severity/associated sxs/prior treatment) Patient is a 72 y.o. female presenting with hip pain. The history is provided by the patient and a relative.  Hip Pain The current episode started 1 to 4 weeks ago. The problem occurs constantly. Pertinent negatives include no chills or fever. Associated symptoms comments: She and daughter report pain and soreness since fall 3 weeks ago affecting left hip and left knee. Pain has been increasing steadily without numbness. oVer the last 3-4 days she has pain in the right lower leg without new injury. She presents this morning because she could not bear weight at all secondary to pain and the feeling that her legs were too weak to hold her up..    Past Medical History  Diagnosis Date  . Diabetes mellitus   . Hypertension   . Thyroid disease   . Arthritis   . High cholesterol   . 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 02/17/2012    Past Surgical History  Procedure Laterality Date  . Abdominal hysterectomy    . Ovary surgery    . Shoulder surgery      Family History  Problem Relation Age of Onset  . Diabetes Mother   . Hypertension Father   . Diabetes Sister   . Diabetes Brother   . Hypertension Brother   . Heart attack Mother   . Lung cancer Father     History  Substance Use Topics  . Smoking status: Never Smoker   . Smokeless tobacco: Not on file  . Alcohol Use: No    OB History   Grav Para Term Preterm Abortions TAB SAB Ect Mult Living                  Review of Systems  Constitutional: Negative for fever and chills.  HENT: Negative.   Respiratory: Negative.   Cardiovascular: Negative.   Gastrointestinal: Negative.   Musculoskeletal:       See HPI.  Skin:  Negative.   Neurological: Negative.     Allergies  Codeine; Remicade; and Zestril  Home Medications   Current Outpatient Rx  Name  Route  Sig  Dispense  Refill  . acetaminophen (TYLENOL) 500 MG tablet   Oral   Take 1,000 mg by mouth 2 (two) times daily. Takes with tramadol         . aspirin 81 MG EC tablet   Oral   Take 81 mg by mouth daily.         [name] atorvastatin (LIPITOR) 20 MG tablet   Oral   Take 20 mg by mouth daily.         . Calcium Carb-Cholecalciferol (CALCIUM + D3 PO)   Oral   Take 2 tablets by mouth daily.         . DULoxetine (CYMBALTA) 60 MG capsule   Oral   Take 60 mg by mouth daily.         . furosemide (LASIX) 20 MG tablet   Oral   Take 20 mg by mouth See admin instructions. Take 1 tab in the twice a day on mon, wed and fri. Take 1 tab daily the rest of the week.         . gabapentin (NEURONTIN) 600  MG tablet   Oral   Take 1,200-1,800 mg by mouth 3 (three) times daily. Take 2 tablets in the morning, 2 tablets in the middle of the day, and 3 tablets at night         . hydroxychloroquine (PLAQUENIL) 200 MG tablet   Oral   Take 200 mg by mouth 2 (two) times daily.          [name] levothyroxine (SYNTHROID, LEVOTHROID) 50 MCG tablet   Oral   Take 50 mcg by mouth daily.         [name] linagliptin (TRADJENTA) 5 MG TABS tablet   Oral   Take 5 mg by mouth daily.         . metoprolol succinate (TOPROL-XL) 100 MG 24 hr tablet   Oral   Take 100 mg by mouth daily. Take with or immediately following a meal.         . Multiple Vitamin (MULTIVITAMIN WITH MINERALS) TABS   Oral   Take 1 tablet by mouth daily.         . nitroGLYCERIN (NITROSTAT) 0.4 MG SL tablet   Sublingual   Place 0.4 mg under the tongue every 5 (five) minutes as needed for chest pain.         [name] olmesartan (BENICAR) 40 MG tablet   Oral   Take 40 mg by mouth daily.         . predniSONE (DELTASONE) 5 MG tablet   Oral   Take 5 mg by mouth daily.         [name]  spironolactone (ALDACTONE) 25 MG tablet   Oral   Take 25 mg by mouth.         . traMADol (ULTRAM) 50 MG tablet   Oral   Take 50 mg by mouth every 6 (six) hours as needed for pain.         . methotrexate (RHEUMATREX) 2.5 MG tablet   Oral   Take 7.5 mg by mouth once a week. Takes on wednesdays           BP 119/82  Pulse 87  Temp(Src) 97.3 F (36.3 C) (Oral)  Resp 16  SpO2 91%  Physical Exam  Constitutional: She is oriented to person, place, and time. She appears well-developed and well-nourished. No distress.  Neck: Normal range of motion.  Cardiovascular: Intact distal pulses.   Pulmonary/Chest: Effort normal. She exhibits no tenderness.  Abdominal: Soft. There is no tenderness.  Musculoskeletal: Normal range of motion.  Tenderness along lateral left thigh from hip to mid-shaft and posterior thigh distally. No swelling of musculature. Left knee has moderate swelling with healing bruising anteriorly. Joint stable. Right leg has tender calf circumferentially without swelling or redness. No joint tenderness, laxity or swelling. No midline lumbar or paralumbar tenderness.  Neurological: She is alert and oriented to person, place, and time. Coordination normal.    ED Course  Procedures (including critical care time)  Labs Reviewed  CBC WITH DIFFERENTIAL - Abnormal; Notable for the following:    RBC 3.73 (*)    Hemoglobin 10.1 (*)    HCT 31.5 (*)    Lymphocytes Relative 9 (*)    All other components within normal limits  BASIC METABOLIC PANEL - Abnormal; Notable for the following:    Glucose, Bld 116 (*)    GFR calc non Af Amer 57 (*)    GFR calc Af Amer 66 (*)    All other components within normal limits  URINALYSIS,  ROUTINE W REFLEX MICROSCOPIC - Abnormal; Notable for the following:    APPearance HAZY (*)    Hgb urine dipstick LARGE (*)    Bilirubin Urine SMALL (*)    All other components within normal limits  URINE MICROSCOPIC-ADD ON - Abnormal; Notable for the  following:    Squamous Epithelial / LPF MANY (*)    Bacteria, UA MANY (*)    All other components within normal limits  URINE CULTURE   No results found.   No diagnosis found.    MDM  Attempt to ambulate patient failed secondary to pain and weakness, "I feel like my legs are going to give out on me". Dr. Tomi Bamberger in to see and evaluate. X-rays ordered. If negative, plan is to get MRI of lumbar spine and admit.        Dewaine Oats, PA-C 07/12/12 1557

## 2012-07-12 NOTE — H&P (Signed)
PCP:   Maximino Greenland, MD   Chief Complaint:  Lower back pain  HPI: 72 yo female with h/o rheumatoid arthritis, meningioma, hypothyroidism comes in with less than 24 hours of acute lower back pain with shooting pains also down to both of her legs.  She has no h/o chronic back pain.  She has had no trauma except a fall 3 weeks ago which she did not suffer from any injuries then or back pain until yesterday.  Denies any fevers.  No dyruria.  Having ble weakness and could not walk today because of weakness so came to ED.  No urinary or bowel incontinence.  No saddle anesthesia.  Her typical RA pain is in her hands.  She has been off remicade and multiple other agents for her rheum arthritis for several months.  Review of Systems:  Positive and negative as per HPI otherwise all other systems are negative  Past Medical History: Past Medical History  Diagnosis Date  . Diabetes mellitus   . Hypertension   . Thyroid disease   . Arthritis   . High cholesterol   . 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 02/17/2012   Past Surgical History  Procedure Laterality Date  . Abdominal hysterectomy    . Ovary surgery    . Shoulder surgery      Medications: Prior to Admission medications   Medication Sig Start Date End Date Taking? Authorizing Provider  acetaminophen (TYLENOL) 500 MG tablet Take 1,000 mg by mouth 2 (two) times daily. Takes with tramadol   Yes Historical Provider, MD  aspirin 81 MG EC tablet Take 81 mg by mouth daily. 01/30/12 01/29/13 Yes Sorin June Leap, MD  atorvastatin (LIPITOR) 20 MG tablet Take 20 mg by mouth daily.   Yes Historical Provider, MD  Calcium Carb-Cholecalciferol (CALCIUM + D3 PO) Take 2 tablets by mouth daily.   Yes Historical Provider, MD  DULoxetine (CYMBALTA) 60 MG capsule Take 60 mg by mouth daily.   Yes Historical Provider, MD  furosemide (LASIX) 20 MG tablet Take 20 mg by mouth See admin instructions. Take 1 tab in the twice a day on mon, wed  and fri. Take 1 tab daily the rest of the week.   Yes Historical Provider, MD  gabapentin (NEURONTIN) 600 MG tablet Take 1,200-1,800 mg by mouth 3 (three) times daily. Take 2 tablets in the morning, 2 tablets in the middle of the day, and 3 tablets at night   Yes Historical Provider, MD  hydroxychloroquine (PLAQUENIL) 200 MG tablet Take 200 mg by mouth 2 (two) times daily.    Yes Historical Provider, MD  levothyroxine (SYNTHROID, LEVOTHROID) 50 MCG tablet Take 50 mcg by mouth daily.   Yes Historical Provider, MD  linagliptin (TRADJENTA) 5 MG TABS tablet Take 5 mg by mouth daily.   Yes Historical Provider, MD  metoprolol succinate (TOPROL-XL) 100 MG 24 hr tablet Take 100 mg by mouth daily. Take with or immediately following a meal.   Yes Historical Provider, MD  Multiple Vitamin (MULTIVITAMIN WITH MINERALS) TABS Take 1 tablet by mouth daily.   Yes Historical Provider, MD  nitroGLYCERIN (NITROSTAT) 0.4 MG SL tablet Place 0.4 mg under the tongue every 5 (five) minutes as needed for chest pain.   Yes Historical Provider, MD  olmesartan (BENICAR) 40 MG tablet Take 40 mg by mouth daily.   Yes Historical Provider, MD  predniSONE (DELTASONE) 5 MG tablet Take 5 mg by mouth daily.   Yes Historical Provider, MD  spironolactone (ALDACTONE) 25 MG tablet Take 25 mg by mouth.   Yes Historical Provider, MD  traMADol (ULTRAM) 50 MG tablet Take 50 mg by mouth every 6 (six) hours as needed for pain.   Yes Historical Provider, MD  methotrexate (RHEUMATREX) 2.5 MG tablet Take 7.5 mg by mouth once a week. Takes on [place] Provider, MD    Allergies:   Allergies  Allergen Reactions  . Codeine Swelling    Facial swelling  . Remicade (Infliximab)     "sent me into shock"  . Zestril (Lisinopril) Swelling    Face and neck swelling    Social History:  reports that she has never smoked. She does not have any smokeless tobacco history on file. She reports that she does not drink alcohol or use illicit  drugs.  Family History: Family History  Problem Relation Age of Onset  . Diabetes Mother   . Hypertension Father   . Diabetes Sister   . Diabetes Brother   . Hypertension Brother   . Heart attack Mother   . Lung cancer Father     Physical Exam: Filed Vitals:   07/12/12 1030 07/12/12 1156 07/12/12 1400 07/12/12 2005  BP: 115/63 136/75 119/82 139/96  Pulse: 96 88 87 92  Temp:    98.7 F (37.1 C)  TempSrc:    Oral  Resp: _0 SpO2: 90% 94% 91% 96%   General appearance: alert, cooperative and no distress Neck: no JVD and supple, symmetrical, trachea midline Back: negative, symmetric, no curvature. ROM normal. No CVA tenderness. Lungs: clear to auscultation bilaterally Heart: regular rate and rhythm, S1, S2 normal, no murmur, click, rub or gallop Abdomen: soft, non-tender; bowel sounds normal; no masses,  no organomegaly Extremities: extremities normal, atraumatic, no cyanosis or edema Pulses: 2+ and symmetric Skin: Skin color, texture, turgor normal. No rashes or lesions Neurologic: Mental status: Alert, oriented, thought content appropriate Cranial nerves: normal  Strength 4/5 ble normal bue    Labs on Admission:   Recent Labs  07/12/12 1051  NA 136  K 3.9  CL 98  CO2 28  GLUCOSE 116*  BUN 15  CREATININE 0.98  CALCIUM 9.1    Recent Labs  07/12/12 1051  WBC 9.8  NEUTROABS 7.4  HGB 10.1*  HCT 31.5*  MCV 84.5  PLT 260   Radiological Exams on Admission: Dg Lumbar Spine Complete  07/12/2012  *RADIOLOGY REPORT*  Clinical Data: Fall, hip pain.  LUMBAR SPINE - COMPLETE 4+ VIEW  Comparison: None  Findings: Diffuse degenerative facet disease throughout the lumbar spine.  Associated slight anterolisthesis of L4 on L5.  Slight disc space narrowing in the lower lumbar spine.  Diffuse osteopenia.  No acute bony abnormality.  No fracture or acute subluxation.  SI joints are symmetric and unremarkable.  IMPRESSION: Degenerative changes.  Mild osteopenia.  No  acute findings.   Original Report Authenticated By: Rolm Baptise, M.D.    Dg Hip Complete Left  07/12/2012  *RADIOLOGY REPORT*  Clinical Data: Fall, left hip pain  LEFT HIP - COMPLETE 2+ VIEW  Comparison: None.  Findings: No fracture or dislocation is seen.  Visualized bony pelvis appears intact.  Degenerative changes of the bilateral hips and lower lumbar spine.  IMPRESSION: No fracture or dislocation is seen.   Original Report Authenticated By: Julian Hy, M.D.    Mr Lumbar Spine Wo Contrast  07/12/2012  *RADIOLOGY REPORT*  Clinical Data: Increasing low back pain with any difficulty  ambulating is secondary to the pain.  MRI LUMBAR SPINE WITHOUT CONTRAST  Technique:  Multiplanar and multiecho pulse sequences of the lumbar spine were obtained without intravenous contrast.  Comparison: Plain film examination 07/12/2012.  No comparison MR.  Findings: Last fully open disc space is labeled L5-S1.  Present examination incorporates from T10-11 disc space through the lower sacrum.  Conus just below the T12-L1 disc space.  Prominent edema within the subcutaneous fat.  Endplate reactive changes most notable superior endplate L5.  Small hemangioma suspected inferior endplate of L1.  No worrisome bony abnormality.  Right renal 5 cm cyst incompletely assessed on the present exam.  T10-11:  Negative.  T11-12:  Left-sided facet joint degenerative changes and mild left- sided foraminal narrowing.  T12-L1:  Mild bulge.  L1-2:  Facet joint degenerative changes.  Mild bulge. Dorsal epidural fat. Slightly short pedicles.  Multifactorial mild spinal stenosis.  L2-3: Facet joint degenerative changes. Mild bulge.  Dorsal epidural fat.  Slightly short pedicles.  Multifactorial mild to slightly moderate spinal stenosis.  L3-4:  Facet joint degenerative changes.  Ligament flavum hypertrophy.  Short pedicles.  Minimal bulge.  Multifactorial moderate spinal stenosis.  L4-5:  Prominent bilateral facet joint degenerative changes.   2.8 mm anterior slip of L4.  Ligamentum flavum hypertrophy.  Bulge. Short pedicles.  Multifactorial moderate to marked spinal stenosis. Mild bilateral foraminal narrowing.  L5-S1: Moderate facet joint degenerative changes greater on the right.  Minimal bulge and spur greater left foraminal position without nerve root compression.  IMPRESSION:  Summary of pertinent findings include:  L4-5 multifactorial moderate to marked spinal stenosis.  Mild bilateral foraminal narrowing.  L3-4 multifactorial moderate spinal stenosis.  L2-3 multifactorial mild to slightly moderate spinal stenosis.  L1-2 multifactorial mild spinal stenosis.  L5-S1 moderate facet joint degenerative changes.  Please see above for further detail.   Original Report Authenticated By: Genia Del, M.D.    Dg Knee Complete 4 Views Left  07/12/2012  *RADIOLOGY REPORT*  Clinical Data: Hip pain, knee pain.  Remote history of fall.  LEFT KNEE - COMPLETE 4+ VIEW  Comparison: None.  Findings: Mild degenerative changes within the left knee.  No visible fracture.  Small joint effusion.  No subluxation or dislocation.  IMPRESSION: Small joint effusion.  Moderate degenerative changes.  No acute bony abnormality.   Original Report Authenticated By: Rolm Baptise, M.D.    Dg Knee Complete 4 Views Right  07/12/2012  *RADIOLOGY REPORT*  Clinical Data: Fall, knee pain.  RIGHT KNEE - COMPLETE 4+ VIEW  Comparison: 07/12/2010  Findings: Moderate tricompartment degenerative changes.  Small joint effusion.  No acute bony abnormality.  No acute fracture, subluxation or dislocation.  IMPRESSION: Moderate tricompartment degenerative changes with joint effusion. No acute findings.   Original Report Authenticated By: Rolm Baptise, M.D.     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  Place on iv morphine prn.  Neurosurgical consult.  Ck cpk level.  Increase her prednisone.    Samah Lapiana A 07/12/2012, 9:31 PM

## 2012-07-12 NOTE — ED Notes (Signed)
Myself and Heather, EMT undressed pt, placed in gown, on monitor, continuous pulse oximetry and blood pressure cuff; family at bedside

## 2012-07-12 NOTE — ED Provider Notes (Signed)
See prior note   Janice Norrie, MD 07/12/12 1630

## 2012-07-12 NOTE — ED Provider Notes (Signed)
Assumed care the patient from PA upstill Ms. Croke is a 72 year old female with a past medical history of rheumatoid arthritis, meningioma of the brain presents with chief complaint of inability to walk.  Patient states that she fell approximately 4 days ago.  The patient fell on her left side and on her left knee.  She's been using a cane at home and has had increased pain in the right knee as well.  She has a history of chronic osteo-and rheumatoid arthritis.  The patient states that last night her pain became more severe with shooting pains in the L3-L4 nerve district distribution of the right leg and severe pain in the left knee.  Today the patient was completely unable to hold her own body weight.  She states that although she has a lot of pain she felt like her legs "would not work.  On physical exam the patient is barely able to lift her legs against gravity.  She has no proximal muscle weakness in the upper extremities.  X-rays showed degenerative changes in the spine.  MRI shows the same with moderate spinal stenosis and multiple bulging discs.  The patient's pain is controlled as long as she is lying still.   ]I have spoken with Dr. Shanon Brow who wil admit the patien due to her inaility to walk.  She has asked that I consult Neurosurgery fo see the patient. The patient appears reasonably stabilized for admission considering the current resources, flow, and capabilities available in the ED at this time, and I doubt any other [facility] requiring further screening and/or treatment in the ED prior to admission.   Margarita Mail, PA-C 07/15/12 2103

## 2012-07-12 NOTE — ED Notes (Signed)
Per EMS pt has hx of arthritis bilaterally in hips. Denies fall.   Pain increased this AM.  Pt also experienced slight chest pressure on exertion and took 1 nitro.  EMS did 12 lead and pt was sinus tach.  Pt alert oriented X4. Pt is normally mobile but is not able to walk due to pain at this time.  Pt has 20g left hand SL.

## 2012-07-12 NOTE — Consult Note (Signed)
Reason for Consult:  Spinal stenosis Referring Physician:  Margarita Mail, PA-C for Dr. Loralee Pacas ([facility] hospitalist service)  [name] is an 72 y.o. female.  HPI: Patient is a 72 year old right-handed black female who presented to the emergency room at 1000, neurosurgical consultation was requested at 2040.  Patient explains that she awoke this morning with shooting pains through her lower extremities, left worse than right. She says that she "couldn't walk" because of pain, and at the right lower extremity was "giving out". She denies any low back pain. She denies significant pain to the lower extremities until this morning. She does not describe any ongoing history of back pain or neurogenic claudication.  Patient was evaluated in the emergency room, and workup included x-rays and MRI scan a lumbar spine. They show a grade 1 spondylolisthesis of L4 and 5, as well as multilevel lumbar spondylosis and degenerative disease, and there is resulting marked multifactorial lumbar stenosis at the L4-5 level.  Patient's history is notable for hypertension, Diabetes for the past year treated with oral medication, rheumatoid arthritis for the past 20 years (initially diagnosed by Dr. Sander Radon, more recently managed by a rheumatologist in [facility], but the patient is transferring her care to Dr. Forde Radon), and a meningioma recently treated with gamma knife therapy at in [facility].   Past Medical History:  Past Medical History  Diagnosis Date  . Diabetes mellitus   . Hypertension   . Thyroid disease   . Arthritis   . High cholesterol   . 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 Abdominal hysterectomy    . Ovary surgery    . Shoulder surgery      Family History:  Family History  Problem Relation Age of Onset  . Diabetes Mother   . Hypertension Father   . Diabetes Sister    . Diabetes Brother   . Hypertension Brother   . Heart attack Mother   . Lung cancer Father     Social History:  reports that she has never smoked. She does not have any smokeless tobacco history on file. She reports that she does not drink alcohol or use illicit drugs.  Allergies:  Allergies  Allergen Reactions  . Codeine Swelling    Facial swelling  . Remicade (Infliximab)     "sent me into shock"  . Zestril (Lisinopril) Swelling    Face and neck swelling    Medications: I have reviewed the patient's current medications.  Review of systems:  Notable for those difficulties described inner history of present illness and past medical history, but is otherwise unremarkable.   Physical Examination: Patient is a morbidly obese black female, in discomfort but no acute distress. BMI is 46.  Blood pressure 139/96, pulse 92, temperature 98.7 F (37.1 C), temperature source Oral, resp. rate 20, SpO2 96.00%. Lungs:   Clear to auscultation, symmetrical respiratory excursion. Heart:   Regular rate and rhythm, no murmur Abdomen:   Obese, nondistended, bowel sounds present.  Extremity:  No clubbing cyanosis or edema  Musculoskeletal:   Straight leg raising negative bilaterally. Patrick sign negative bilaterally.   Neurological Examination: Mental Status Examination:   Awake and alert, oriented.  Motor Examination:   Iliopsoas is at least 4/5 bilaterally. Quadriceps, dorsiflexor, EHL, and plantar flexor are 5/5 bilaterally.  Sensory Examination:   Intact to pinprick to the thighs, legs, and feet bilaterally.  Reflex Examination:   Absent at the quadriceps and gastrocnemius bilaterally, toes downgoing bilaterally. Gait and Stance Examination:   Not tested due to the nature the patient's condition.    Results for orders placed during the hospital encounter of 07/12/12 (from the past 48 hour(s))  CBC WITH DIFFERENTIAL     Status: Abnormal   Collection Time    07/12/12 10:51 AM      Result  Value Range   WBC 9.8  4.0 - 10.5 K/uL   RBC 3.73 (*) 3.87 - 5.11 MIL/uL   Hemoglobin 10.1 (*) 12.0 - 15.0 g/dL   HCT 31.5 (*) 36.0 - 46.0 %   MCV 84.5  78.0 - 100.0 fL   MCH 27.1  26.0 - 34.0 pg   MCHC 32.1  30.0 - 36.0 g/dL   RDW 14.8  11.5 - 15.5 %   Platelets 260  150 - 400 K/uL   Neutrophils Relative 76  43 - 77 %   Neutro Abs 7.4  1.7 - 7.7 K/uL   Lymphocytes Relative 9 (*) 12 - 46 %   Lymphs Abs 0.9  0.7 - 4.0 K/uL   Monocytes Relative 10  3 - 12 %   Monocytes Absolute 1.0  0.1 - 1.0 K/uL   Eosinophils Relative 4  0 - 5 %   Eosinophils Absolute 0.4  0.0 - 0.7 K/uL   Basophils Relative 0  0 - 1 %   Basophils Absolute 0.0  0.0 - 0.1 K/uL  BASIC METABOLIC PANEL     Status: Abnormal   Collection Time    07/12/12 10:51 AM      Result Value Range   Sodium 136  135 - 145 mEq/L   Potassium 3.9  3.5 - 5.1 mEq/L   Chloride 98  96 - 112 mEq/L   CO2 28  19 - 32 mEq/L   Glucose, Bld 116 (*) 70 - 99 mg/dL   BUN 15  6 - 23 mg/dL   Creatinine, Ser 0.98  0.50 - 1.10 mg/dL   Calcium 9.1  8.4 - 10.5 mg/dL   GFR calc non Af Amer 57 (*) >90 mL/min   GFR calc Af Amer 66 (*) >90 mL/min   Comment:            The eGFR has been calculated     using the CKD EPI equation.     This calculation has not been     validated in all clinical     situations.     eGFR's persistently     <90 mL/min signify     possible Chronic Kidney Disease.  URINALYSIS, ROUTINE W REFLEX MICROSCOPIC     Status: Abnormal   Collection Time    07/12/12 12:34 PM      Result Value Range   Color, Urine YELLOW  YELLOW   APPearance HAZY (*) CLEAR   Specific Gravity, Urine 1.021  1.005 - 1.030   pH 5.0  5.0 - 8.0   Glucose, UA NEGATIVE  NEGATIVE mg/dL   Hgb urine dipstick LARGE (*) NEGATIVE   Bilirubin Urine SMALL (*) NEGATIVE   Ketones, ur NEGATIVE  NEGATIVE mg/dL   Protein, ur NEGATIVE  NEGATIVE mg/dL   Urobilinogen, UA 1.0  0.0 - 1.0 mg/dL   Nitrite NEGATIVE  NEGATIVE   Leukocytes, UA NEGATIVE  NEGATIVE  URINE  MICROSCOPIC-ADD ON     Status: Abnormal   Collection Time    07/12/12 12:34 PM  Result Value Range   Squamous Epithelial / LPF MANY (*) RARE   WBC, UA 0-2  <3 WBC/hpf   RBC / HPF 11-20  <3 RBC/hpf   Bacteria, UA MANY (*) RARE    Dg Lumbar Spine Complete  07/12/2012  *RADIOLOGY REPORT*  Clinical Data: Fall, hip pain.  LUMBAR SPINE - COMPLETE 4+ VIEW  Comparison: None  Findings: Diffuse degenerative facet disease throughout the lumbar spine.  Associated slight anterolisthesis of L4 on L5.  Slight disc space narrowing in the lower lumbar spine.  Diffuse osteopenia.  No acute bony abnormality.  No fracture or acute subluxation.  SI joints are symmetric and unremarkable.  IMPRESSION: Degenerative changes.  Mild osteopenia.  No acute findings.   Original Report Authenticated By: Rolm Baptise, M.D.    Dg Hip Complete Left  07/12/2012  *RADIOLOGY REPORT*  Clinical Data: Fall, left hip pain  LEFT HIP - COMPLETE 2+ VIEW  Comparison: None.  Findings: No fracture or dislocation is seen.  Visualized bony pelvis appears intact.  Degenerative changes of the bilateral hips and lower lumbar spine.  IMPRESSION: No fracture or dislocation is seen.   Original Report Authenticated By: Julian Hy, M.D.    Mr Lumbar Spine Wo Contrast  07/12/2012  *RADIOLOGY REPORT*  Clinical Data: Increasing low back pain with any difficulty ambulating is secondary to the pain.  MRI LUMBAR SPINE WITHOUT CONTRAST  Technique:  Multiplanar and multiecho pulse sequences of the lumbar spine were obtained without intravenous contrast.  Comparison: Plain film examination 07/12/2012.  No comparison MR.  Findings: Last fully open disc space is labeled L5-S1.  Present examination incorporates from T10-11 disc space through the lower sacrum.  Conus just below the T12-L1 disc space.  Prominent edema within the subcutaneous fat.  Endplate reactive changes most notable superior endplate L5.  Small hemangioma suspected inferior endplate of L1.   No worrisome bony abnormality.  Right renal 5 cm cyst incompletely assessed on the present exam.  T10-11:  Negative.  T11-12:  Left-sided facet joint degenerative changes and mild left- sided foraminal narrowing.  T12-L1:  Mild bulge.  L1-2:  Facet joint degenerative changes.  Mild bulge. Dorsal epidural fat. Slightly short pedicles.  Multifactorial mild spinal stenosis.  L2-3: Facet joint degenerative changes. Mild bulge.  Dorsal epidural fat.  Slightly short pedicles.  Multifactorial mild to slightly moderate spinal stenosis.  L3-4:  Facet joint degenerative changes.  Ligament flavum hypertrophy.  Short pedicles.  Minimal bulge.  Multifactorial moderate spinal stenosis.  L4-5:  Prominent bilateral facet joint degenerative changes.  2.8 mm anterior slip of L4.  Ligamentum flavum hypertrophy.  Bulge. Short pedicles.  Multifactorial moderate to marked spinal stenosis. Mild bilateral foraminal narrowing.  L5-S1: Moderate facet joint degenerative changes greater on the right.  Minimal bulge and spur greater left foraminal position without nerve root compression.  IMPRESSION:  Summary of pertinent findings include:  L4-5 multifactorial moderate to marked spinal stenosis.  Mild bilateral foraminal narrowing.  L3-4 multifactorial moderate spinal stenosis.  L2-3 multifactorial mild to slightly moderate spinal stenosis.  L1-2 multifactorial mild spinal stenosis.  L5-S1 moderate facet joint degenerative changes.  Please see above for further detail.   Original Report Authenticated By: Genia Del, M.D.    Dg Knee Complete 4 Views Left  07/12/2012  *RADIOLOGY REPORT*  Clinical Data: Hip pain, knee pain.  Remote history of fall.  LEFT KNEE - COMPLETE 4+ VIEW  Comparison: None.  Findings: Mild degenerative changes within the left knee.  No visible  fracture.  Small joint effusion.  No subluxation or dislocation.  IMPRESSION: Small joint effusion.  Moderate degenerative changes.  No acute bony abnormality.   Original Report  Authenticated By: Rolm Baptise, M.D.    Dg Knee Complete 4 Views Right  07/12/2012  *RADIOLOGY REPORT*  Clinical Data: Fall, knee pain.  RIGHT KNEE - COMPLETE 4+ VIEW  Comparison: 07/12/2010  Findings: Moderate tricompartment degenerative changes.  Small joint effusion.  No acute bony abnormality.  No acute fracture, subluxation or dislocation.  IMPRESSION: Moderate tricompartment degenerative changes with joint effusion. No acute findings.   Original Report Authenticated By: Rolm Baptise, M.D.      Assessment/Plan: Patient with complaints of pain in the lower extremities.  X-rays and MRI scan show evidence of a grade 1 degenerative spondylolisthesis of L4 and 5, lumbar spondylosis, lumbar degenerative disc disease, and marked multifactorial lumbar stenosis at the L4-5 level. Patient does seem to have some proximal lower extremity weakness, but certainly the distal strength is full.   Patient has numerous comorbidities including hypertension, diabetes, and rheumatoid arthritis. She's been treated with prednisone for her rheumatoid arthritis which may contribute to the proximal lower extremity weakness.  I feel it is essential for the admitting triad hospitalist service to work up and rule out other etiologies of pain into the lower extremities despite the arthritic degenerative findings seen on x-ray and MRI.  I favor mechanical VTE prophylaxis (SCDs) rather than pharmacologic VTE prophylaxis. I feel that PT and OT consultations will be helpful in progressing the patient to mobilize and ambulate.  I had met with the patient, her husband, and one of her daughters (she has 6 children) and discussed my assessment and recommendations from a neurosurgical perspective. I've emphasized to the patient and her family that her overall comprehensive evaluation will be done by the triad hospitalist service physicians.  Hosie Spangle, MD 07/12/2012, 9:10 PM

## 2012-07-12 NOTE — ED Notes (Signed)
Pt states she has shooting pain bilaterally that starts from ankles and moves upward. Pt states she has never had this pain before.

## 2012-07-13 DIAGNOSIS — M069 Rheumatoid arthritis, unspecified: Secondary | ICD-10-CM | POA: Diagnosis not present

## 2012-07-13 DIAGNOSIS — R262 Difficulty in walking, not elsewhere classified: Secondary | ICD-10-CM | POA: Diagnosis not present

## 2012-07-13 DIAGNOSIS — M48061 Spinal stenosis, lumbar region without neurogenic claudication: Secondary | ICD-10-CM | POA: Diagnosis not present

## 2012-07-13 DIAGNOSIS — R82998 Other abnormal findings in urine: Secondary | ICD-10-CM | POA: Diagnosis not present

## 2012-07-13 LAB — URINE CULTURE

## 2012-07-13 LAB — BASIC METABOLIC PANEL
BUN: 15 mg/dL (ref 6–23)
GFR calc non Af Amer: 67 mL/min — ABNORMAL LOW (ref >90)
Glucose, Bld: 102 mg/dL — ABNORMAL HIGH (ref 70–99)
Potassium: 3.8 mEq/L (ref 3.5–5.1)

## 2012-07-13 LAB — CBC
Hemoglobin: 10.6 g/dL — ABNORMAL LOW (ref 12.0–15.0)
MCH: 26.9 pg (ref 26.0–34.0)
MCHC: 31.9 g/dL (ref 30.0–36.0)

## 2012-07-13 LAB — GLUCOSE, CAPILLARY: Glucose-Capillary: 225 mg/dL — ABNORMAL HIGH (ref 70–99)

## 2012-07-13 MED ORDER — INSULIN ASPART 100 UNIT/ML ~~LOC~~ SOLN
0.0000 [IU] | Freq: Three times a day (TID) | SUBCUTANEOUS | Status: DC
  Administered 2012-07-13: 5 [IU] via SUBCUTANEOUS
  Administered 2012-07-14 – 2012-07-15 (×4): 2 [IU] via SUBCUTANEOUS

## 2012-07-13 MED ORDER — METHYLPREDNISOLONE SODIUM SUCC 125 MG IJ SOLR
80.0000 mg | Freq: Three times a day (TID) | INTRAMUSCULAR | Status: DC
  Administered 2012-07-13 – 2012-07-14 (×4): 80 mg via INTRAVENOUS
  Filled 2012-07-13 (×6): qty 1.28

## 2012-07-13 MED ORDER — ACETAMINOPHEN 325 MG PO TABS
650.0000 mg | ORAL_TABLET | ORAL | Status: DC | PRN
  Administered 2012-07-14: 650 mg via ORAL
  Filled 2012-07-13: qty 2

## 2012-07-13 NOTE — Progress Notes (Signed)
Utilization Review Completed.Donne Anon T2/25/2014

## 2012-07-13 NOTE — Progress Notes (Signed)
[facility] PROGRESS NOTE  [name] HWE:[id number] DOB: [birth year] DOA: 07/12/2012 PCP: Maximino Greenland, MD  HPI/Subjective: Feels about the same, still has pain shoots down her lower extremities.  Assessment/Plan:  Spinal stenosis -Spinal stenosis of lumbar region with radiculopathy. -Secondary to rheumatoid arthritis. Control pain with narcotics. -There is no much of weakness, she's not able to ambulate likely secondary to severe pain. -Start PT, neurosurgery saw the patient and defer any surgical intervention.  Rheumatoid arthritis -Patient was on biological agent, methotrexate and prednisone. -Per her said rheumatologist to start her biological agent secondary to meningioma of the left sphenoid sinus. -I will increase her prednisone, treated with high-dose of steroids.  Asymptomatic bacteriuria -Multiple bacteria and squamous epithelial cells, no much of pus cells, LE or nitrites to suggest UTI.  Diabetes mellitus type 2 -Patient is on Linagliptin, with the introduction of the steroids blood sugar expected to be elevated. -She will be on carbohydrate modified diet and insulin sliding scale.  Code Status: Full code Family Communication: Plan discussed with patient and daughter at bedside. Disposition Plan: Inpatient   Consultants:  None  Procedures:  None  Antibiotics:  None  Objective: Filed Vitals:   07/12/12 1400 07/12/12 2005 07/12/12 2242 07/13/12 0453  BP: 119/82 139/96 135/70 132/79  Pulse: 87 92 80 56  Temp:  98.7 F (37.1 C) 98.2 F (36.8 C) 98.2 F (36.8 C)  TempSrc:  Oral Oral Oral  Resp: _0 Height:   5' 3.5" (1.613 m)   Weight:   112.8 kg (248 lb 10.9 oz)   SpO2: 91% 96% 94% 95%    Intake/Output Summary (Last 24 hours) at 07/13/12 1313 Last data filed at 07/13/12 0900  Gross per 24 hour  Intake    240 ml  Output      0 ml  Net    240 ml   Filed Weights   07/12/12 2242  Weight: 112.8 kg (248 lb 10.9 oz)     Exam:  General: Alert and awake, oriented x3, not in any acute distress. HEENT: anicteric sclera, pupils reactive to light and accommodation, EOMI CVS: S1-S2 clear, no murmur rubs or gallops Chest: clear to auscultation bilaterally, no wheezing, rales or rhonchi Abdomen: soft nontender, nondistended, normal bowel sounds, no organomegaly Extremities: no cyanosis, clubbing or edema noted bilaterally Neuro: Cranial nerves II-XII intact, no focal neurological deficits  Data Reviewed: Basic Metabolic Panel:  Recent Labs Lab 07/12/12 1051 07/13/12 0655  NA 136 139  K 3.9 3.8  CL 98 100  CO2 28 28  GLUCOSE 116* 102*  BUN 15 15  CREATININE 0.98 0.86  CALCIUM 9.1 9.1   Liver Function Tests: No results found for this basename: AST, ALT, ALKPHOS, BILITOT, PROT, ALBUMIN,  in the last 168 hours No results found for this basename: LIPASE, AMYLASE,  in the last 168 hours No results found for this basename: AMMONIA,  in the last 168 hours CBC:  Recent Labs Lab 07/12/12 1051 07/13/12 0655  WBC 9.8 8.5  NEUTROABS 7.4  --   HGB 10.1* 10.6*  HCT 31.5* 33.2*  MCV 84.5 84.3  PLT 260 287   Cardiac Enzymes:  Recent Labs Lab 07/12/12 2245  CKTOTAL 57   BNP (last 3 results) No results found for this basename: PROBNP,  in the last 8760 hours CBG: No results found for this basename: GLUCAP,  in the last 168 hours  No results found for this or any previous visit (from the past  240 hour(s)).   Studies: Dg Lumbar Spine Complete  07/12/2012  *RADIOLOGY REPORT*  Clinical Data: Fall, hip pain.  LUMBAR SPINE - COMPLETE 4+ VIEW  Comparison: None  Findings: Diffuse degenerative facet disease throughout the lumbar spine.  Associated slight anterolisthesis of L4 on L5.  Slight disc space narrowing in the lower lumbar spine.  Diffuse osteopenia.  No acute bony abnormality.  No fracture or acute subluxation.  SI joints are symmetric and unremarkable.  IMPRESSION: Degenerative changes.  Mild  osteopenia.  No acute findings.   Original Report Authenticated By: Rolm Baptise, M.D.    Dg Hip Complete Left  07/12/2012  *RADIOLOGY REPORT*  Clinical Data: Fall, left hip pain  LEFT HIP - COMPLETE 2+ VIEW  Comparison: None.  Findings: No fracture or dislocation is seen.  Visualized bony pelvis appears intact.  Degenerative changes of the bilateral hips and lower lumbar spine.  IMPRESSION: No fracture or dislocation is seen.   Original Report Authenticated By: Julian Hy, M.D.    Mr Lumbar Spine Wo Contrast  07/12/2012  *RADIOLOGY REPORT*  Clinical Data: Increasing low back pain with any difficulty ambulating is secondary to the pain.  MRI LUMBAR SPINE WITHOUT CONTRAST  Technique:  Multiplanar and multiecho pulse sequences of the lumbar spine were obtained without intravenous contrast.  Comparison: Plain film examination 07/12/2012.  No comparison MR.  Findings: Last fully open disc space is labeled L5-S1.  Present examination incorporates from T10-11 disc space through the lower sacrum.  Conus just below the T12-L1 disc space.  Prominent edema within the subcutaneous fat.  Endplate reactive changes most notable superior endplate L5.  Small hemangioma suspected inferior endplate of L1.  No worrisome bony abnormality.  Right renal 5 cm cyst incompletely assessed on the present exam.  T10-11:  Negative.  T11-12:  Left-sided facet joint degenerative changes and mild left- sided foraminal narrowing.  T12-L1:  Mild bulge.  L1-2:  Facet joint degenerative changes.  Mild bulge. Dorsal epidural fat. Slightly short pedicles.  Multifactorial mild spinal stenosis.  L2-3: Facet joint degenerative changes. Mild bulge.  Dorsal epidural fat.  Slightly short pedicles.  Multifactorial mild to slightly moderate spinal stenosis.  L3-4:  Facet joint degenerative changes.  Ligament flavum hypertrophy.  Short pedicles.  Minimal bulge.  Multifactorial moderate spinal stenosis.  L4-5:  Prominent bilateral facet joint  degenerative changes.  2.8 mm anterior slip of L4.  Ligamentum flavum hypertrophy.  Bulge. Short pedicles.  Multifactorial moderate to marked spinal stenosis. Mild bilateral foraminal narrowing.  L5-S1: Moderate facet joint degenerative changes greater on the right.  Minimal bulge and spur greater left foraminal position without nerve root compression.  IMPRESSION:  Summary of pertinent findings include:  L4-5 multifactorial moderate to marked spinal stenosis.  Mild bilateral foraminal narrowing.  L3-4 multifactorial moderate spinal stenosis.  L2-3 multifactorial mild to slightly moderate spinal stenosis.  L1-2 multifactorial mild spinal stenosis.  L5-S1 moderate facet joint degenerative changes.  Please see above for further detail.   Original Report Authenticated By: Genia Del, M.D.    Dg Knee Complete 4 Views Left  07/12/2012  *RADIOLOGY REPORT*  Clinical Data: Hip pain, knee pain.  Remote history of fall.  LEFT KNEE - COMPLETE 4+ VIEW  Comparison: None.  Findings: Mild degenerative changes within the left knee.  No visible fracture.  Small joint effusion.  No subluxation or dislocation.  IMPRESSION: Small joint effusion.  Moderate degenerative changes.  No acute bony abnormality.   Original Report Authenticated By: Rolm Baptise, M.D.  Dg Knee Complete 4 Views Right  07/12/2012  *RADIOLOGY REPORT*  Clinical Data: Fall, knee pain.  RIGHT KNEE - COMPLETE 4+ VIEW  Comparison: 07/12/2010  Findings: Moderate tricompartment degenerative changes.  Small joint effusion.  No acute bony abnormality.  No acute fracture, subluxation or dislocation.  IMPRESSION: Moderate tricompartment degenerative changes with joint effusion. No acute findings.   Original Report Authenticated By: Rolm Baptise, M.D.     Scheduled Meds: . atorvastatin  20 mg Oral q1800  . DULoxetine  60 mg Oral Daily  . furosemide  20 mg Oral Custom  . [START ON 07/14/2012] furosemide  20 mg Oral Custom  . gabapentin  1,200 mg Oral BID WC  .  gabapentin  1,800 mg Oral QHS  . hydroxychloroquine  200 mg Oral BID  . levothyroxine  50 mcg Oral QAC breakfast  . linagliptin  5 mg Oral Daily  . metoprolol succinate  100 mg Oral Daily  . predniSONE  20 mg Oral BID WC  . 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 If 8PM-8AM, please contact night-coverage at [email or web address], password [facility] 07/13/2012, 1:13 PM  LOS: 1 day

## 2012-07-13 NOTE — Care Management Note (Signed)
Page 1 of 1   07/15/2012     12:26:29 PM   CARE MANAGEMENT NOTE 07/15/2012  Patient:  [name], [name]   Account Number:  [id number]  Date Initiated:  07/13/2012  Documentation initiated by:  Tomi Bamberger  Subjective/Objective Assessment:   dx spinal stenosis of lumbar region with radiculopathy  admit as observation- lives with spouse.     Action/Plan:   pt eval- rec snf   Anticipated DC Date:  07/15/2012   Anticipated DC Plan:  IP REHAB FACILITY  In-house referral  Clinical Social Worker      DC Planning Services  CM consult      Choice offered to / List presented to:             Status of service:  Completed, signed off Medicare Important Message given?   (If response is "NO", the following Medicare IM given date fields will be blank) Date Medicare IM given:   Date Additional Medicare IM given:    Discharge Disposition:  IP REHAB FACILITY  Per UR Regulation:  Reviewed for med. necessity/level of care/duration of stay  If discussed at [place] of Stay Meetings, dates discussed:    Comments:  07/15/12 12:25 Tomi Bamberger RN, BSN [phone] patient for dc to CIR today.  07/14/12 10:00 Tomi Bamberger RN,BSN CIR consulted.  07/13/12 17:03 Tomi Bamberger RN, BSN [phone] patient lives with spouse, uses a cane, daughter has been helping her at home.  Patient is recieving iv solumedrol, and morphine iv,patient can not ambulate.  CSW referral.

## 2012-07-13 NOTE — Evaluation (Signed)
Physical Therapy Evaluation Patient Details Name: [name] MRN: [id number] DOB: 1940/10/16 Today's Date: 07/13/2012 Time: 1220-1300 PT Time Calculation (min): 40 min  PT Assessment / Plan / Recommendation Clinical Impression  Pt adm with back and leg pain.  Pt very motivated to participate.  With pt's current status pt may need ST-SNF.  If pt improves rapidly pt may be able to return home with family.    PT Assessment  Patient needs continued PT services    Follow Up Recommendations  SNF    Does the patient have the potential to tolerate intense rehabilitation      Barriers to Discharge        Equipment Recommendations  None recommended by PT (if pt receives her walker in the mail)    Recommendations for Other Services     Frequency Min 3X/week    Precautions / Restrictions Precautions Precautions: Fall   Pertinent Vitals/Pain Pt c/o lt knee pain primarily as well as some lt groin/thigh pain.      Mobility  Bed Mobility Bed Mobility: Supine to Sit;Sitting - Scoot to Edge of Bed Supine to Sit: 4: Min assist Sitting - Scoot to [employer] of Bed: 4: Min assist Details for Bed Mobility Assistance: Assist to bring trunk up and hips to EOB. Transfers Transfers: Sit to Stand;Stand to Sit;Stand Pivot Transfers Sit to Stand: 1: +2 Total assist;With upper extremity assist;From bed;From chair/3-in-1 Sit to Stand: Patient Percentage: 60% Stand to Sit: 1: +2 Total assist;With upper extremity assist;To bed;To chair/3-in-1 Stand to Sit: Patient Percentage: 60% Stand Pivot Transfers: 1: +1 Total assist (and Clarise Cruz plus) Transfer via Lift Equipment: Marketing executive Details for [employer]: Stood from bed with 2 person assist and rolling walker but unable to fully extend hips and knees.  Stood with Clarise Cruz Plus and able to achieve full extension with [employer]. Transferred from bed to [facility] to bed with [place] [place].    Exercises     PT Diagnosis: Difficulty walking;Generalized  weakness;Acute pain  PT Problem List: Decreased strength;Decreased activity tolerance;Decreased mobility;Pain PT Treatment Interventions: DME instruction;Gait training;Patient/family education;Functional mobility training;Therapeutic activities;Therapeutic exercise   PT Goals Acute Rehab PT Goals PT Goal Formulation: With patient Pt will go Supine/Side to Sit: with supervision PT Goal: Supine/Side to Sit - Progress: Goal set today Pt will go Sit to Supine/Side: with supervision PT Goal: Sit to Supine/Side - Progress: Goal set today Pt will go Sit to Stand: with min assist PT Goal: Sit to Stand - Progress: Goal set today Pt will go Stand to Sit: with min assist PT Goal: Stand to Sit - Progress: Goal set today Pt will Ambulate: 1 - 15 feet;with min assist;with least restrictive assistive device PT Goal: Ambulate - Progress: Goal set today  Visit Information  Last PT Received On: 07/13/12 Assistance Needed: +2    Subjective Data  Subjective: Pt states she fatigues quickly with activity. Patient Stated Goal: Return to prior level of function.   Prior Functioning  Home Living Lives With: Spouse Available Help at Discharge: Family;Available 24 hours/day Type of Home: House Home Access: Stairs to enter [employer] of Steps: 1 Home Layout: Two level;Able to live on main level with bedroom/bathroom Bathroom Shower/Tub: Walk-in shower Home Adaptive Equipment: Bedside commode/3-in-1;Straight cane;Shower chair with back Additional Comments: Has ordered rolling walker from [place] and it is supposed to have been mailed to the pt. last week but she hasn't received it yet. Prior Function Level of Independence: Independent with assistive device(s)  Vocation: Retired Comments: Used cane to [employer] and was waiting for walker to arrive. Communication Communication: No difficulties    Cognition  Cognition Overall Cognitive Status: Appears within functional limits for tasks  assessed/performed Arousal/Alertness: Awake/alert Orientation Level: Appears intact for tasks assessed Behavior During Session: [facility] for tasks performed    Extremity/Trunk Assessment Right Lower Extremity Assessment RLE ROM/Strength/Tone: Deficits RLE ROM/Strength/Tone Deficits: grossly 3-/5 Left Lower Extremity Assessment LLE ROM/Strength/Tone: Deficits LLE ROM/Strength/Tone Deficits: grossly 3-/5   Balance    End of Session PT - End of Session Equipment Utilized During Treatment: Other (comment) Clarise Cruz Plus) Activity Tolerance: Patient limited by fatigue Patient left: in bed;with call bell/phone within reach;with family/visitor present (sitting EOB) Nurse Communication: Mobility status;Need for lift equipment  GP Functional Assessment Tool Used: clincal judgement Functional Limitation: Mobility: Walking and moving around Mobility: Walking and Moving Around Current Status [phone]): At least 60 percent but less than 80 percent impaired, limited or restricted Mobility: Walking and Moving Around Goal Status [phone]): At least 1 percent but less than 20 percent impaired, limited or restricted   [facility] 07/13/2012, 2:10 PM  [facility] PT [phone]

## 2012-07-14 DIAGNOSIS — R82998 Other abnormal findings in urine: Secondary | ICD-10-CM | POA: Diagnosis not present

## 2012-07-14 DIAGNOSIS — M5137 Other intervertebral disc degeneration, lumbosacral region: Secondary | ICD-10-CM | POA: Diagnosis not present

## 2012-07-14 DIAGNOSIS — M4310 Spondylolisthesis, site unspecified: Secondary | ICD-10-CM | POA: Diagnosis not present

## 2012-07-14 DIAGNOSIS — M48061 Spinal stenosis, lumbar region without neurogenic claudication: Secondary | ICD-10-CM | POA: Diagnosis not present

## 2012-07-14 DIAGNOSIS — M47817 Spondylosis without myelopathy or radiculopathy, lumbosacral region: Secondary | ICD-10-CM | POA: Diagnosis not present

## 2012-07-14 DIAGNOSIS — M069 Rheumatoid arthritis, unspecified: Secondary | ICD-10-CM | POA: Diagnosis not present

## 2012-07-14 DIAGNOSIS — I10 Essential (primary) hypertension: Secondary | ICD-10-CM | POA: Diagnosis not present

## 2012-07-14 LAB — CBC
HCT: 34.1 % — ABNORMAL LOW (ref 36.0–46.0)
Hemoglobin: 11.1 g/dL — ABNORMAL LOW (ref 12.0–15.0)
MCH: 27.5 pg (ref 26.0–34.0)
MCHC: 32.6 g/dL (ref 30.0–36.0)
MCV: 84.4 fL (ref 78.0–100.0)
Platelets: 348 10*3/uL (ref 150–400)
RBC: 4.04 MIL/uL (ref 3.87–5.11)
RDW: 14.5 % (ref 11.5–15.5)
WBC: 7.8 10*3/uL (ref 4.0–10.5)

## 2012-07-14 LAB — GLUCOSE, CAPILLARY
Glucose-Capillary: 148 mg/dL — ABNORMAL HIGH (ref 70–99)
Glucose-Capillary: 133 mg/dL — ABNORMAL HIGH (ref 70–99)
Glucose-Capillary: 144 mg/dL — ABNORMAL HIGH (ref 70–99)
Glucose-Capillary: 162 mg/dL — ABNORMAL HIGH (ref 70–99)

## 2012-07-14 LAB — BASIC METABOLIC PANEL
BUN: 21 mg/dL (ref 6–23)
CO2: 29 mEq/L (ref 19–32)
Calcium: 9.3 mg/dL (ref 8.4–10.5)
Chloride: 97 mEq/L (ref 96–112)
Creatinine, Ser: 0.9 mg/dL (ref 0.50–1.10)
GFR calc Af Amer: 73 mL/min — ABNORMAL LOW (ref >90)
GFR calc non Af Amer: 63 mL/min — ABNORMAL LOW (ref >90)
Glucose, Bld: 155 mg/dL — ABNORMAL HIGH (ref 70–99)
Potassium: 4.1 mEq/L (ref 3.5–5.1)
Sodium: 136 mEq/L (ref 135–145)

## 2012-07-14 MED ORDER — INSULIN DETEMIR 100 UNIT/ML ~~LOC~~ SOLN
5.0000 [IU] | Freq: Every day | SUBCUTANEOUS | Status: DC
  Administered 2012-07-14: 5 [IU] via SUBCUTANEOUS
  Filled 2012-07-14: qty 10

## 2012-07-14 MED ORDER — TRAMADOL HCL 50 MG PO TABS
50.0000 mg | ORAL_TABLET | Freq: Four times a day (QID) | ORAL | Status: DC | PRN
  Administered 2012-07-15: 50 mg via ORAL
  Filled 2012-07-14: qty 2

## 2012-07-14 MED ORDER — METHYLPREDNISOLONE SODIUM SUCC 125 MG IJ SOLR
60.0000 mg | Freq: Three times a day (TID) | INTRAMUSCULAR | Status: DC
  Administered 2012-07-14 – 2012-07-15 (×2): 60 mg via INTRAVENOUS
  Filled 2012-07-14 (×5): qty 0.96

## 2012-07-14 NOTE — Progress Notes (Signed)
[facility] PROGRESS NOTE  [name] KGU:[id number] DOB: Jul 28, 1940 DOA: 07/12/2012 PCP: Maximino Greenland, MD  HPI/Subjective: Feels improved regarding leg pain.  Fighting head aches that she blames on meningioma.  Assessment/Plan:  Spinal stenosis -Spinal stenosis of lumbar region with radiculopathy. -Secondary to rheumatoid arthritis. Control pain with narcotics. -There is not much weakness, she's not able to ambulate likely secondary to severe pain. -Start PT, neurosurgery recommending non-surgical interventions for now.   -patient appears to be a good candidate for CIR.  Will ask case management for CIR eval..  Rheumatoid arthritis -Patient was on biological agent, methotrexate and prednisone. -Per her said rheumatologist to start her biological agent secondary to meningioma of the left sphenoid sinus. - increased prednisone, treated with high-dose of steroids.  Asymptomatic bacteriuria -Multiple bacteria and squamous epithelial cells, no much of pus cells, LE or nitrites to suggest UTI.  Diabetes mellitus type 2 -Patient is on Linagliptin, with the introduction of the steroids blood sugar expected to be elevated. -She will be on carbohydrate modified diet and insulin sliding scale and a small dose of levemir.  Code Status: Full code Family Communication: Plan discussed with patient and daughter at bedside. Disposition Plan: Inpatient   Consultants:  Neurosurgery  Procedures:  None  Antibiotics:  None  Objective: Filed Vitals:   07/13/12 2018 07/13/12 2202 07/14/12 0507 07/14/12 1416  BP: 151/72 132/76 137/78 172/87  Pulse: 96 90 87 84  Temp: 97.8 F (36.6 C) 99 F (37.2 C) 97.8 F (36.6 C) 97.6 F (36.4 C)  TempSrc: Oral Oral Oral Oral  Resp: _0 Height:      Weight:      SpO2: 92% 93% 93% 92%    Intake/Output Summary (Last 24 hours) at 07/14/12 1430 Last data filed at 07/13/12 2000  Gross per 24 hour  Intake    120 ml   Output      0 ml  Net    120 ml   Filed Weights   07/12/12 2242  Weight: 112.8 kg (248 lb 10.9 oz)    Exam:  General: Alert and awake, oriented x3, not in any acute distress. Several family members at bedside. HEENT: anicteric sclera, pupils reactive to light and accommodation, EOMI CVS: S1-S2 clear, no murmur rubs or gallops Chest: clear to auscultation bilaterally, no wheezing, rales or rhonchi Abdomen: obese, soft nontender, nondistended, normal bowel sounds, no organomegaly Extremities: no cyanosis, clubbing or edema noted bilaterally.  Able to move all 4.  Has decreased ROM in right knee compared to left.    Data Reviewed: Basic Metabolic Panel:  Recent Labs Lab 07/12/12 1051 07/13/12 0655 07/14/12 0700  NA 136 139 136  K 3.9 3.8 4.1  CL 98 100 97  CO2 _1 GLUCOSE 116* 102* 155*  BUN _2 CREATININE 0.98 0.86 0.90  CALCIUM 9.1 9.1 9.3   CBC:  Recent Labs Lab 07/12/12 1051 07/13/12 0655 07/14/12 0700  WBC 9.8 8.5 7.8  NEUTROABS 7.4  --   --   HGB 10.1* 10.6* 11.1*  HCT 31.5* 33.2* 34.1*  MCV 84.5 84.3 84.4  PLT 260 287 348   Cardiac Enzymes:  Recent Labs Lab 07/12/12 2245  CKTOTAL 57   CBG:  Recent Labs Lab 07/13/12 1702 07/13/12 2219 07/14/12 0749 07/14/12 1141  GLUCAP 206* 225* 148* 133*    Recent Results (from the past 240 hour(s))  URINE CULTURE     Status: None   Collection  Time    07/12/12 12:34 PM      Result Value Range Status   Specimen Description URINE, RANDOM   Final   Special Requests ADDED [id number]   Final   Culture  Setup Time 07/12/2012 18:07   Final   Colony Count 60,000 COLONIES/ML   Final   Culture     Final   Value: Culture reincubated for better growth Multiple bacterial morphotypes present, none predominant. Suggest appropriate recollection if clinically indicated.   Report Status 07/13/2012 FINAL   Final     Studies: Dg Lumbar Spine Complete  07/12/2012  *RADIOLOGY REPORT*  Clinical Data:  Fall, hip pain.  LUMBAR SPINE - COMPLETE 4+ VIEW  Comparison: None  Findings: Diffuse degenerative facet disease throughout the lumbar spine.  Associated slight anterolisthesis of L4 on L5.  Slight disc space narrowing in the lower lumbar spine.  Diffuse osteopenia.  No acute bony abnormality.  No fracture or acute subluxation.  SI joints are symmetric and unremarkable.  IMPRESSION: Degenerative changes.  Mild osteopenia.  No acute findings.   Original Report Authenticated By: Rolm Baptise, M.D.    Dg Hip Complete Left  07/12/2012  *RADIOLOGY REPORT*  Clinical Data: Fall, left hip pain  LEFT HIP - COMPLETE 2+ VIEW  Comparison: None.  Findings: No fracture or dislocation is seen.  Visualized bony pelvis appears intact.  Degenerative changes of the bilateral hips and lower lumbar spine.  IMPRESSION: No fracture or dislocation is seen.   Original Report Authenticated By: Julian Hy, M.D.    Mr Lumbar Spine Wo Contrast  07/12/2012  *RADIOLOGY REPORT*  Clinical Data: Increasing low back pain with any difficulty ambulating is secondary to the pain.  MRI LUMBAR SPINE WITHOUT CONTRAST  Technique:  Multiplanar and multiecho pulse sequences of the lumbar spine were obtained without intravenous contrast.  Comparison: Plain film examination 07/12/2012.  No comparison MR.  Findings: Last fully open disc space is labeled L5-S1.  Present examination incorporates from T10-11 disc space through the lower sacrum.  Conus just below the T12-L1 disc space.  Prominent edema within the subcutaneous fat.  Endplate reactive changes most notable superior endplate L5.  Small hemangioma suspected inferior endplate of L1.  No worrisome bony abnormality.  Right renal 5 cm cyst incompletely assessed on the present exam.  T10-11:  Negative.  T11-12:  Left-sided facet joint degenerative changes and mild left- sided foraminal narrowing.  T12-L1:  Mild bulge.  L1-2:  Facet joint degenerative changes.  Mild bulge. Dorsal epidural fat.  Slightly short pedicles.  Multifactorial mild spinal stenosis.  L2-3: Facet joint degenerative changes. Mild bulge.  Dorsal epidural fat.  Slightly short pedicles.  Multifactorial mild to slightly moderate spinal stenosis.  L3-4:  Facet joint degenerative changes.  Ligament flavum hypertrophy.  Short pedicles.  Minimal bulge.  Multifactorial moderate spinal stenosis.  L4-5:  Prominent bilateral facet joint degenerative changes.  2.8 mm anterior slip of L4.  Ligamentum flavum hypertrophy.  Bulge. Short pedicles.  Multifactorial moderate to marked spinal stenosis. Mild bilateral foraminal narrowing.  L5-S1: Moderate facet joint degenerative changes greater on the right.  Minimal bulge and spur greater left foraminal position without nerve root compression.  IMPRESSION:  Summary of pertinent findings include:  L4-5 multifactorial moderate to marked spinal stenosis.  Mild bilateral foraminal narrowing.  L3-4 multifactorial moderate spinal stenosis.  L2-3 multifactorial mild to slightly moderate spinal stenosis.  L1-2 multifactorial mild spinal stenosis.  L5-S1 moderate facet joint degenerative changes.  Please see above for  further detail.   Original Report Authenticated By: Genia Del, M.D.    Dg Knee Complete 4 Views Left  07/12/2012  *RADIOLOGY REPORT*  Clinical Data: Hip pain, knee pain.  Remote history of fall.  LEFT KNEE - COMPLETE 4+ VIEW  Comparison: None.  Findings: Mild degenerative changes within the left knee.  No visible fracture.  Small joint effusion.  No subluxation or dislocation.  IMPRESSION: Small joint effusion.  Moderate degenerative changes.  No acute bony abnormality.   Original Report Authenticated By: Rolm Baptise, M.D.    Dg Knee Complete 4 Views Right  07/12/2012  *RADIOLOGY REPORT*  Clinical Data: Fall, knee pain.  RIGHT KNEE - COMPLETE 4+ VIEW  Comparison: 07/12/2010  Findings: Moderate tricompartment degenerative changes.  Small joint effusion.  No acute bony abnormality.  No acute  fracture, subluxation or dislocation.  IMPRESSION: Moderate tricompartment degenerative changes with joint effusion. No acute findings.   Original Report Authenticated By: Rolm Baptise, M.D.     Scheduled Meds: . atorvastatin  20 mg Oral q1800  . DULoxetine  60 mg Oral Daily  . furosemide  20 mg Oral Custom  . furosemide  20 mg Oral Custom  . gabapentin  1,200 mg Oral BID WC  . gabapentin  1,800 mg Oral QHS  . hydroxychloroquine  200 mg Oral BID  . insulin aspart  0-15 Units Subcutaneous TID WC  . levothyroxine  50 mcg Oral QAC breakfast  . linagliptin  5 mg Oral Daily  . methylPREDNISolone (SOLU-MEDROL) injection  80 mg Intravenous Q8H  . metoprolol succinate  100 mg Oral Daily  . 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 If 8PM-8AM, please contact night-coverage at [email or web address], password [facility] 07/14/2012, 2:30 PM  LOS: 2 days   Attending Patient seen and examined, agree with the assessment and plan. Pain much better today-will get CIR eval,if not a candidate for CIR-will need SNF. Taper steroids gradually  S Chanti Golubski

## 2012-07-14 NOTE — Progress Notes (Signed)
Rehab Admissions Coordinator Note:  Patient was screened by Cleatrice Burke for appropriateness for an Inpatient Acute Rehab Consult. Noted much improvement with therapy today. Rehab consult is pending today. Karene Fry will be following this pt as our admissions coordinator [phone].  Cleatrice Burke 07/14/2012, 2:37 PM  I can be reached at [phone].

## 2012-07-14 NOTE — Consult Note (Signed)
Physical Medicine and Rehabilitation Consult Reason for Consult: Back pain radiating to LE, weakness with difficulty walking Referring Physician:  Dr. Sloan Leiter   HPI: [name] is a 72 y.o. female with history of RA, DM, with diabetic neuropathy,  meningioma resection; who was admitted on 07/12/12 with shooting pain in BLE with RLE instability and inability to walk. Patient with reports of fall three weeks PTA with soreness left hip and left knee. MRI lumbar spine revealed moderate to marked spinal stenosis  L4-5, moderate stenosis L3-4 and mild to moderate stenosis L2-3. She was evaluated by Dr. Rita Ohara who recommended PT/OT for proximal LE weakness as patient with multiple cormorbidities.  Was started on IV steroids to help with pain management. PT evaluation done and patient limited by pain and endurance. PT, MD recommending CIR.   Review of Systems  Constitutional: Positive for malaise/fatigue (due to back pain past few weeks. ).  HENT: Positive for hearing loss.   Eyes: Positive for double vision (almost resolved. Still with left field cut from meningioma. ).  Respiratory: Negative for cough and shortness of breath.   Cardiovascular: Negative for chest pain and palpitations.  Gastrointestinal: Negative for heartburn, nausea and constipation.  Genitourinary: Positive for urgency and frequency.  Musculoskeletal: Positive for joint pain (Right shoulder pain/weakness due to pinced nerve.).  Neurological: Positive for dizziness (occasionally), sensory change (neuropathy bilateral feet and hands. ), weakness and headaches.  Psychiatric/Behavioral: Positive for depression (mood improving). The patient has insomnia (due to pain).    Past Medical History  Diagnosis Date  . Diabetes mellitus   . Hypertension   . Thyroid disease   . Arthritis   . High cholesterol   . 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 02/17/2012   Past Surgical History  Procedure Laterality Date   . Abdominal hysterectomy    . Ovary surgery    . Shoulder surgery     Family History  Problem Relation Age of Onset  . Diabetes Mother   . Hypertension Father   . Diabetes Sister   . Diabetes Brother   . Hypertension Brother   . Heart attack Mother   . Lung cancer Father    Social History:  Married. Retired [employer]. Used cane/walker PTA due to balance/vision issues from meningioma. Supportive family-daughters have been assisting with 24 hours care since 10/13. She reports that she has never smoked. She does not have any smokeless tobacco history on file. She reports that she does not drink alcohol or use illicit drugs.  Allergies  Allergen Reactions  . Codeine Swelling    Facial swelling  . Remicade (Infliximab)     "sent me into shock"  . Zestril (Lisinopril) Swelling    Face and neck swelling   Medications Prior to Admission  Medication Sig Dispense Refill  . acetaminophen (TYLENOL) 500 MG tablet Take 1,000 mg by mouth 2 (two) times daily. Takes with tramadol      . aspirin 81 MG EC tablet Take 81 mg by mouth daily.      [name] atorvastatin (LIPITOR) 20 MG tablet Take 20 mg by mouth daily.      . Calcium Carb-Cholecalciferol (CALCIUM + D3 PO) Take 2 tablets by mouth daily.      . DULoxetine (CYMBALTA) 60 MG capsule Take 60 mg by mouth daily.      . furosemide (LASIX) 20 MG tablet Take 20 mg by mouth See admin instructions. Take 1 tab in the twice a day on mon, wed and  fri. Take 1 tab daily the rest of the week.      . gabapentin (NEURONTIN) 600 MG tablet Take 1,200-1,800 mg by mouth 3 (three) times daily. Take 2 tablets in the morning, 2 tablets in the middle of the day, and 3 tablets at night      . hydroxychloroquine (PLAQUENIL) 200 MG tablet Take 200 mg by mouth 2 (two) times daily.       [name] levothyroxine (SYNTHROID, LEVOTHROID) 50 MCG tablet Take 50 mcg by mouth daily.      [name] linagliptin (TRADJENTA) 5 MG TABS tablet Take 5 mg by mouth daily.      . metoprolol succinate (TOPROL-XL)  100 MG 24 hr tablet Take 100 mg by mouth daily. Take with or immediately following a meal.      . Multiple Vitamin (MULTIVITAMIN WITH MINERALS) TABS Take 1 tablet by mouth daily.      . nitroGLYCERIN (NITROSTAT) 0.4 MG SL tablet Place 0.4 mg under the tongue every 5 (five) minutes as needed for chest pain.      [name] olmesartan (BENICAR) 40 MG tablet Take 40 mg by mouth daily.      . predniSONE (DELTASONE) 5 MG tablet Take 5 mg by mouth daily.      [name] spironolactone (ALDACTONE) 25 MG tablet Take 25 mg by mouth.      . traMADol (ULTRAM) 50 MG tablet Take 50 mg by mouth every 6 (six) hours as needed for pain.      . methotrexate (RHEUMATREX) 2.5 MG tablet Take 7.5 mg by mouth once a week. Takes on [place]: Home Living Lives With: Spouse Available Help at Discharge: Family;Available 24 hours/day Type of Home: House Home Access: Stairs to enter [employer] of Steps: 1 Home Layout: Two level;Able to live on main level with bedroom/bathroom Bathroom Shower/Tub: Walk-in shower Home Adaptive Equipment: Bedside commode/3-in-1;Straight cane;Shower chair with back Additional Comments: Has ordered rolling walker from [place] and it is supposed to have been mailed to the pt. last week but she hasn't received it yet.  Functional History: Prior Function Vocation: Retired Comments: Used cane to [employer] and was waiting for walker to arrive. Functional Status:  Mobility: Bed Mobility Bed Mobility: Supine to Sit;Sitting - Scoot to Edge of Bed Supine to Sit: 4: Min assist Sitting - Scoot to [employer] of Bed: 4: Min assist Transfers Transfers: Sit to Stand;Stand to [employer] Transfers Sit to Stand: 1: +2 Total assist;With upper extremity assist;From bed;From chair/3-in-1 Sit to Stand: Patient Percentage: 60% Stand to Sit: 1: +2 Total assist;With upper extremity assist;To bed;To chair/3-in-1 Stand to Sit: Patient Percentage: 60% Stand Pivot Transfers: 1: +1 Total assist  (and Clarise Cruz plus) Transfer via Lift Equipment: Marketing executive      ADL:    Cognition: Cognition Arousal/Alertness: Awake/alert Orientation Level: Oriented X4 Cognition Overall Cognitive Status: Appears within functional limits for tasks assessed/performed Arousal/Alertness: Awake/alert Orientation Level: Appears intact for tasks assessed Behavior During Session: [facility] for tasks performed  Blood pressure 137/78, pulse 87, temperature 97.8 F (36.6 C), temperature source Oral, resp. rate 20, height 5' 3.5" (1.613 m), weight 112.8 kg (248 lb 10.9 oz), SpO2 93.00%. Physical Exam  Nursing note and vitals reviewed. Constitutional: She is oriented to person, place, and time. She appears well-developed and well-nourished.  Obese female  HENT:  Head: Normocephalic and atraumatic.  Eyes: EOM are normal. Pupils are equal, round, and reactive to light.  Neck: Normal range  of motion. Neck supple.  Cardiovascular: Normal rate and regular rhythm.   Pulmonary/Chest: Effort normal and breath sounds normal.  Abdominal: Soft. Bowel sounds are normal.  Musculoskeletal: She exhibits no edema.  No gross joint abnormalities on visual exam. Knees slightly tender with PROM as are shoulders  Neurological: She is alert and oriented to person, place, and time. She displays normal reflexes. No cranial nerve deficit. Coordination normal.  UES 4+/5. HF 2, KE 3, ADF/APF 4. Stocking glove sensory loss from mid calf distally.   Skin: Skin is warm and dry.  Psychiatric: She has a normal mood and affect. Her behavior is normal. Judgment and thought content normal.    Results for orders placed during the hospital encounter of 07/12/12 (from the past 24 hour(s))  GLUCOSE, CAPILLARY     Status: Abnormal   Collection Time    07/13/12  5:02 PM      Result Value Range   Glucose-Capillary 206 (*) 70 - 99 mg/dL  GLUCOSE, CAPILLARY     Status: Abnormal   Collection Time    07/13/12 10:19 PM      Result Value Range    Glucose-Capillary 225 (*) 70 - 99 mg/dL  BASIC METABOLIC PANEL     Status: Abnormal   Collection Time    07/14/12  7:00 AM      Result Value Range   Sodium 136  135 - 145 mEq/L   Potassium 4.1  3.5 - 5.1 mEq/L   Chloride 97  96 - 112 mEq/L   CO2 29  19 - 32 mEq/L   Glucose, Bld 155 (*) 70 - 99 mg/dL   BUN 21  6 - 23 mg/dL   Creatinine, Ser 0.90  0.50 - 1.10 mg/dL   Calcium 9.3  8.4 - 10.5 mg/dL   GFR calc non Af Amer 63 (*) >90 mL/min   GFR calc Af Amer 73 (*) >90 mL/min  CBC     Status: Abnormal   Collection Time    07/14/12  7:00 AM      Result Value Range   WBC 7.8  4.0 - 10.5 K/uL   RBC 4.04  3.87 - 5.11 MIL/uL   Hemoglobin 11.1 (*) 12.0 - 15.0 g/dL   HCT 34.1 (*) 36.0 - 46.0 %   MCV 84.4  78.0 - 100.0 fL   MCH 27.5  26.0 - 34.0 pg   MCHC 32.6  30.0 - 36.0 g/dL   RDW 14.5  11.5 - 15.5 %   Platelets 348  150 - 400 K/uL  GLUCOSE, CAPILLARY     Status: Abnormal   Collection Time    07/14/12  7:49 AM      Result Value Range   Glucose-Capillary 148 (*) 70 - 99 mg/dL  GLUCOSE, CAPILLARY     Status: Abnormal   Collection Time    07/14/12 11:41 AM      Result Value Range   Glucose-Capillary 133 (*) 70 - 99 mg/dL   Dg Lumbar Spine Complete  07/12/2012  *RADIOLOGY REPORT*  Clinical Data: Fall, hip pain.  LUMBAR SPINE - COMPLETE 4+ VIEW  Comparison: None  Findings: Diffuse degenerative facet disease throughout the lumbar spine.  Associated slight anterolisthesis of L4 on L5.  Slight disc space narrowing in the lower lumbar spine.  Diffuse osteopenia.  No acute bony abnormality.  No fracture or acute subluxation.  SI joints are symmetric and unremarkable.  IMPRESSION: Degenerative changes.  Mild osteopenia.  No acute findings.  Original Report Authenticated By: Rolm Baptise, M.D.    Dg Hip Complete Left  07/12/2012  *RADIOLOGY REPORT*  Clinical Data: Fall, left hip pain  LEFT HIP - COMPLETE 2+ VIEW  Comparison: None.  Findings: No fracture or dislocation is seen.  Visualized bony  pelvis appears intact.  Degenerative changes of the bilateral hips and lower lumbar spine.  IMPRESSION: No fracture or dislocation is seen.   Original Report Authenticated By: Julian Hy, M.D.    Mr Lumbar Spine Wo Contrast  07/12/2012  *RADIOLOGY REPORT*  Clinical Data: Increasing low back pain with any difficulty ambulating is secondary to the pain.  MRI LUMBAR SPINE WITHOUT CONTRAST  Technique:  Multiplanar and multiecho pulse sequences of the lumbar spine were obtained without intravenous contrast.  Comparison: Plain film examination 07/12/2012.  No comparison MR.  Findings: Last fully open disc space is labeled L5-S1.  Present examination incorporates from T10-11 disc space through the lower sacrum.  Conus just below the T12-L1 disc space.  Prominent edema within the subcutaneous fat.  Endplate reactive changes most notable superior endplate L5.  Small hemangioma suspected inferior endplate of L1.  No worrisome bony abnormality.  Right renal 5 cm cyst incompletely assessed on the present exam.  T10-11:  Negative.  T11-12:  Left-sided facet joint degenerative changes and mild left- sided foraminal narrowing.  T12-L1:  Mild bulge.  L1-2:  Facet joint degenerative changes.  Mild bulge. Dorsal epidural fat. Slightly short pedicles.  Multifactorial mild spinal stenosis.  L2-3: Facet joint degenerative changes. Mild bulge.  Dorsal epidural fat.  Slightly short pedicles.  Multifactorial mild to slightly moderate spinal stenosis.  L3-4:  Facet joint degenerative changes.  Ligament flavum hypertrophy.  Short pedicles.  Minimal bulge.  Multifactorial moderate spinal stenosis.  L4-5:  Prominent bilateral facet joint degenerative changes.  2.8 mm anterior slip of L4.  Ligamentum flavum hypertrophy.  Bulge. Short pedicles.  Multifactorial moderate to marked spinal stenosis. Mild bilateral foraminal narrowing.  L5-S1: Moderate facet joint degenerative changes greater on the right.  Minimal bulge and spur greater  left foraminal position without nerve root compression.  IMPRESSION:  Summary of pertinent findings include:  L4-5 multifactorial moderate to marked spinal stenosis.  Mild bilateral foraminal narrowing.  L3-4 multifactorial moderate spinal stenosis.  L2-3 multifactorial mild to slightly moderate spinal stenosis.  L1-2 multifactorial mild spinal stenosis.  L5-S1 moderate facet joint degenerative changes.  Please see above for further detail.   Original Report Authenticated By: Genia Del, M.D.    Dg Knee Complete 4 Views Left  07/12/2012  *RADIOLOGY REPORT*  Clinical Data: Hip pain, knee pain.  Remote history of fall.  LEFT KNEE - COMPLETE 4+ VIEW  Comparison: None.  Findings: Mild degenerative changes within the left knee.  No visible fracture.  Small joint effusion.  No subluxation or dislocation.  IMPRESSION: Small joint effusion.  Moderate degenerative changes.  No acute bony abnormality.   Original Report Authenticated By: Rolm Baptise, M.D.    Dg Knee Complete 4 Views Right  07/12/2012  *RADIOLOGY REPORT*  Clinical Data: Fall, knee pain.  RIGHT KNEE - COMPLETE 4+ VIEW  Comparison: 07/12/2010  Findings: Moderate tricompartment degenerative changes.  Small joint effusion.  No acute bony abnormality.  No acute fracture, subluxation or dislocation.  IMPRESSION: Moderate tricompartment degenerative changes with joint effusion. No acute findings.   Original Report Authenticated By: Rolm Baptise, M.D.     Assessment/Plan: Diagnosis: lumbar spondylosis with multifactorial/multilevel stenosis with neurogenic claudication/substantial proximal leg weakness 1. Does  the need for close, 24 hr/day medical supervision in concert with the patient's rehab needs make it unreasonable for this patient to be served in a less intensive setting? Yes 2. Co-Morbidities requiring supervision/potential complications: RA, DPN, HTN, obesity 3. Due to bladder management, bowel management, safety, skin/wound care, disease  management, medication administration, pain management and patient education, does the patient require 24 hr/day rehab nursing? Yes 4. Does the patient require coordinated care of a physician, rehab nurse, PT (1-2 hrs/day, 5 days/week) and OT (1-2 hrs/day, 5 days/week) to address physical and functional deficits in the context of the above medical diagnosis(es)? Yes Addressing deficits in the following areas: balance, endurance, locomotion, strength, transferring, bowel/bladder control, bathing, dressing, feeding, grooming, toileting and psychosocial support 5. Can the patient actively participate in an intensive therapy program of at least 3 hrs of therapy per day at least 5 days per week? Yes 6. The potential for patient to make measurable gains while on inpatient rehab is excellent 7. Anticipated functional outcomes upon discharge from inpatient rehab are supervision to mod I with PT, supervision to min assist with OT, n/a with SLP. 8. Estimated rehab length of stay to reach the above functional goals is: 7-10 days 9. Does the patient have adequate social supports to accommodate these discharge functional goals? Yes 10. Anticipated D/C setting: Home 11. Anticipated post D/C treatments: [place] therapy 12. Overall Rehab/Functional Prognosis: excellent  RECOMMENDATIONS: This patient's condition is appropriate for continued rehabilitative care in the following setting: CIR Patient has agreed to participate in recommended program. Yes Note that insurance prior authorization may be required for reimbursement for recommended care.  Comment: Pt is highly motivated to improve her mobility and strength. Rehab RN to follow up.   Oval Linsey, MD     07/14/2012

## 2012-07-14 NOTE — Progress Notes (Signed)
Physical Therapy Treatment Patient Details Name: [name] MRN: [id number] DOB: 1940/07/07 Today's Date: 07/14/2012 Time: 2440-1027 PT Time Calculation (min): 37 min  PT Assessment / Plan / Recommendation Comments on Treatment Session  Pt. has improved significantly today, had no R leg pain and was able to ambulate a short distance in the room near the bed. Pt. is very pleased with her progress. Pt. will benmefit from post acute  rehab.     Follow Up Recommendations  CIR;SNF     Does the patient have the potential to tolerate intense rehabilitation     Barriers to Discharge        Equipment Recommendations  None recommended by PT    Recommendations for Other Services    Frequency Min 3X/week   Plan Discharge plan needs to be updated;Frequency remains appropriate    Precautions / Restrictions Precautions Precautions: Fall Precaution Comments: R knee may buckle   Pertinent Vitals/Pain No pain today reported.    Mobility  Bed Mobility Supine to Sit: 4: Min assist;HOB elevated;With rails Details for Bed Mobility Assistance: Assist to bring trunk up to up right Transfers Sit to Stand: 3: Mod assist;From bed;With upper extremity assist Stand to Sit: 4: Min assist Stand Pivot Transfers: 3: Mod assist Details for Transfer Assistance: Pt stood at RW x 2 with mod assist.  support of R knee provided to prevent buckling. first trial pt could not fully stand, second trial pt was able to stand and took 2 steps forward and backward. Pt then pivoted back to bed from [facility] with RW Ambulation/Gait Ambulation/Gait Assistance: 3: Mod assist(+ 1 present for safety) Ambulation Distance (Feet): 15 Feet Assistive device: Rolling walker Ambulation/Gait Assistance Details: Pt took several steps away from the bed, made a circle back to the bed, sat on [facility] after it was brought up. Gait Pattern: Step-to pattern;Wide base of support Gait velocity: Pt then ambulated ina circle from the bed ,  then sat on [facility] after brought beside bed. R leg did not buckle.    Exercises     PT Diagnosis:    PT Problem List:   PT Treatment Interventions:     PT Goals Acute Rehab PT Goals Pt will go Supine/Side to Sit: with supervision PT Goal: Supine/Side to Sit - Progress: Progressing toward goal Pt will go Sit to Stand: with min assist PT Goal: Sit to Stand - Progress: Progressing toward goal Pt will go Stand to Sit: with supervision PT Goal: Stand to Sit - Progress: Updated due to goals met Pt will Ambulate: 1 - 15 feet;with min assist;with least restrictive assistive device PT Goal: Ambulate - Progress: Progressing toward goal  Visit Information  Last PT Received On: 07/14/12 Assistance Needed: +2    Subjective Data  Subjective: I am so happy that i can get up   Cognition  Cognition Overall Cognitive Status: Appears within functional limits for tasks assessed/performed Arousal/Alertness: Awake/alert    Balance     End of Session PT - End of Session Equipment Utilized During Treatment: Gait belt Activity Tolerance: Patient tolerated treatment well Patient left: with call bell/phone within reach (sitting on edge for lunch) Nurse Communication: Mobility status   GP     [name] 07/14/2012, 2:25 [id number]

## 2012-07-14 NOTE — Progress Notes (Signed)
Subjective: Patient reports that she is becoming gradually more comfortable, less pain into the lower extremities. Mobility does still severely limited to. She was seen by PT yesterday and mobilize with a lift, PT is to see her again today.  Objective: Vital signs in last 24 hours: Filed Vitals:   07/13/12 1327 07/13/12 2018 07/13/12 2202 07/14/12 0507  BP: 133/83 151/72 132/76 137/78  Pulse: 100 96 90 87  Temp: 98.3 F (36.8 C) 97.8 F (36.6 C) 99 F (37.2 C) 97.8 F (36.6 C)  TempSrc: Oral Oral Oral Oral  Resp: _0 Height:      Weight:      SpO2: 91% 92% 93% 93%    Intake/Output from previous day: 02/25 0701 - 02/26 0700 In: 600 [P.O.:600] Out: -  Intake/Output this shift:    Physical Exam:  Motor examination shows 5/5 strength in the iliopsoas, quadriceps, dorsiflexor, and plantar flexor bilaterally. Patient is able to turn in bed more easily than 2 nights ago.  CBC  Recent Labs  07/13/12 0655 07/14/12 0700  WBC 8.5 7.8  HGB 10.6* 11.1*  HCT 33.2* 34.1*  PLT 287 348   BMET  Recent Labs  07/13/12 0655 07/14/12 0700  NA 139 136  K 3.8 4.1  CL 100 97  CO2 28 29  GLUCOSE 102* 155*  BUN 15 21  CREATININE 0.86 0.90  CALCIUM 9.1 9.3    Assessment/Plan: Some limited improvement, favor continuing physical therapy and current nonsurgical management. Patient does understand that if she does not make sufficient progress, surgery might be necessary, but it would be quite extensive, and entail significant risks particularly in consideration of her multiple comorbidities.  Discussed case with today's physical therapist, Concepcion Elk.   Hosie Spangle, MD 07/14/2012, 1:10 PM

## 2012-07-15 ENCOUNTER — Encounter (HOSPITAL_COMMUNITY): Payer: Self-pay | Admitting: *Deleted

## 2012-07-15 ENCOUNTER — Inpatient Hospital Stay (HOSPITAL_COMMUNITY)
Admission: RE | Admit: 2012-07-15 | Discharge: 2012-07-24 | DRG: 946 | Disposition: A | Discharge status: Home / Self Care | Payer: Medicare Other | Source: Intra-hospital | Attending: Physical Medicine & Rehabilitation | Admitting: Physical Medicine & Rehabilitation

## 2012-07-15 ENCOUNTER — Encounter (HOSPITAL_COMMUNITY): Payer: Self-pay | Admitting: Physical Medicine and Rehabilitation

## 2012-07-15 DIAGNOSIS — E1142 Type 2 diabetes mellitus with diabetic polyneuropathy: Secondary | ICD-10-CM | POA: Diagnosis not present

## 2012-07-15 DIAGNOSIS — M069 Rheumatoid arthritis, unspecified: Secondary | ICD-10-CM | POA: Diagnosis present

## 2012-07-15 DIAGNOSIS — E7800 Pure hypercholesterolemia, unspecified: Secondary | ICD-10-CM | POA: Diagnosis not present

## 2012-07-15 DIAGNOSIS — E1149 Type 2 diabetes mellitus with other diabetic neurological complication: Secondary | ICD-10-CM | POA: Diagnosis not present

## 2012-07-15 DIAGNOSIS — M5416 Radiculopathy, lumbar region: Secondary | ICD-10-CM | POA: Diagnosis present

## 2012-07-15 DIAGNOSIS — E039 Hypothyroidism, unspecified: Secondary | ICD-10-CM | POA: Diagnosis present

## 2012-07-15 DIAGNOSIS — M48061 Spinal stenosis, lumbar region without neurogenic claudication: Secondary | ICD-10-CM

## 2012-07-15 DIAGNOSIS — M545 Low back pain, unspecified: Secondary | ICD-10-CM | POA: Diagnosis present

## 2012-07-15 DIAGNOSIS — F329 Major depressive disorder, single episode, unspecified: Secondary | ICD-10-CM | POA: Diagnosis not present

## 2012-07-15 DIAGNOSIS — F3289 Other specified depressive episodes: Secondary | ICD-10-CM | POA: Diagnosis not present

## 2012-07-15 DIAGNOSIS — M48062 Spinal stenosis, lumbar region with neurogenic claudication: Secondary | ICD-10-CM | POA: Diagnosis present

## 2012-07-15 DIAGNOSIS — E669 Obesity, unspecified: Secondary | ICD-10-CM | POA: Diagnosis present

## 2012-07-15 DIAGNOSIS — I10 Essential (primary) hypertension: Secondary | ICD-10-CM | POA: Diagnosis not present

## 2012-07-15 DIAGNOSIS — Z5189 Encounter for other specified aftercare: Secondary | ICD-10-CM | POA: Diagnosis not present

## 2012-07-15 LAB — GLUCOSE, CAPILLARY
Glucose-Capillary: 127 mg/dL — ABNORMAL HIGH (ref 70–99)
Glucose-Capillary: 121 mg/dL — ABNORMAL HIGH (ref 70–99)

## 2012-07-15 MED ORDER — BISACODYL 10 MG RE SUPP: 10.0000 mg | Freq: Every day | RECTAL | Status: DC | PRN

## 2012-07-15 MED ORDER — LEVOTHYROXINE SODIUM 50 MCG PO TABS
50.0000 ug | ORAL_TABLET | Freq: Every day | ORAL | Status: DC
  Administered 2012-07-16 – 2012-07-24 (×9): 50 ug via ORAL
  Filled 2012-07-15 (×10): qty 1

## 2012-07-15 MED ORDER — METOPROLOL SUCCINATE ER 100 MG PO TB24
100.0000 mg | ORAL_TABLET | Freq: Every day | ORAL | Status: DC
  Administered 2012-07-16 – 2012-07-24 (×9): 100 mg via ORAL
  Filled 2012-07-15 (×10): qty 1

## 2012-07-15 MED ORDER — PREDNISONE 50 MG PO TABS: 60.0000 mg | ORAL_TABLET | Freq: Every day | ORAL | Status: DC

## 2012-07-15 MED ORDER — FLEET ENEMA 7-19 GM/118ML RE ENEM: 1.0000 | ENEMA | Freq: Once | RECTAL | Status: AC | PRN

## 2012-07-15 MED ORDER — PREDNISONE 50 MG PO TABS: 50.0000 mg | ORAL_TABLET | Freq: Every day | ORAL | Status: DC

## 2012-07-15 MED ORDER — INSULIN ASPART 100 UNIT/ML ~~LOC~~ SOLN: 0.0000 [IU] | Freq: Every day | SUBCUTANEOUS | Status: DC

## 2012-07-15 MED ORDER — PREDNISONE 20 MG PO TABS: 20.0000 mg | ORAL_TABLET | Freq: Every day | ORAL | Status: DC

## 2012-07-15 MED ORDER — PREDNISONE 50 MG PO TABS
60.0000 mg | ORAL_TABLET | Freq: Every day | ORAL | Status: AC
  Administered 2012-07-15: 60 mg via ORAL
  Filled 2012-07-15 (×2): qty 1

## 2012-07-15 MED ORDER — FUROSEMIDE 20 MG PO TABS
20.0000 mg | ORAL_TABLET | ORAL | Status: DC
Start: 2012-07-17 — End: 2012-07-23
  Administered 2012-07-17 – 2012-07-22 (×4): 20 mg via ORAL
  Filled 2012-07-15 (×5): qty 1

## 2012-07-15 MED ORDER — ACETAMINOPHEN 325 MG PO TABS
325.0000 mg | ORAL_TABLET | ORAL | Status: DC | PRN
  Administered 2012-07-19 – 2012-07-22 (×3): 650 mg via ORAL
  Filled 2012-07-15 (×4): qty 2

## 2012-07-15 MED ORDER — TRAMADOL HCL 50 MG PO TABS
50.0000 mg | ORAL_TABLET | Freq: Four times a day (QID) | ORAL | Status: DC | PRN
  Administered 2012-07-16: 50 mg via ORAL
  Administered 2012-07-16: 100 mg via ORAL
  Administered 2012-07-17: 50 mg via ORAL
  Administered 2012-07-17 – 2012-07-22 (×4): 100 mg via ORAL
  Filled 2012-07-15 (×5): qty 2
  Filled 2012-07-15: qty 1
  Filled 2012-07-15 (×2): qty 2

## 2012-07-15 MED ORDER — ALUM & MAG HYDROXIDE-SIMETH 200-200-20 MG/5ML PO SUSP
30.0000 mL | ORAL | Status: DC | PRN
  Administered 2012-07-18: 30 mL via ORAL
  Filled 2012-07-15 (×2): qty 30

## 2012-07-15 MED ORDER — PREDNISONE 20 MG PO TABS
40.0000 mg | ORAL_TABLET | Freq: Every day | ORAL | Status: AC
  Administered 2012-07-17: 40 mg via ORAL
  Filled 2012-07-15: qty 2

## 2012-07-15 MED ORDER — FUROSEMIDE 20 MG PO TABS
20.0000 mg | ORAL_TABLET | ORAL | Status: DC
  Administered 2012-07-16 – 2012-07-23 (×7): 20 mg via ORAL
  Filled 2012-07-15 (×8): qty 1

## 2012-07-15 MED ORDER — LINAGLIPTIN 5 MG PO TABS
5.0000 mg | ORAL_TABLET | Freq: Every day | ORAL | Status: DC
  Administered 2012-07-16 – 2012-07-24 (×9): 5 mg via ORAL
  Filled 2012-07-15 (×10): qty 1

## 2012-07-15 MED ORDER — PROCHLORPERAZINE 25 MG RE SUPP
12.5000 mg | Freq: Four times a day (QID) | RECTAL | Status: DC | PRN
  Filled 2012-07-15: qty 1

## 2012-07-15 MED ORDER — INSULIN DETEMIR 100 UNIT/ML ~~LOC~~ SOLN
5.0000 [IU] | Freq: Every day | SUBCUTANEOUS | Status: DC
  Administered 2012-07-15 – 2012-07-20 (×6): 5 [IU] via SUBCUTANEOUS
  Filled 2012-07-15: qty 10

## 2012-07-15 MED ORDER — PROCHLORPERAZINE MALEATE 5 MG PO TABS
5.0000 mg | ORAL_TABLET | Freq: Four times a day (QID) | ORAL | Status: DC | PRN
  Filled 2012-07-15: qty 2

## 2012-07-15 MED ORDER — PREDNISONE 20 MG PO TABS
30.0000 mg | ORAL_TABLET | Freq: Every day | ORAL | Status: AC
  Administered 2012-07-18: 30 mg via ORAL
  Filled 2012-07-15: qty 1

## 2012-07-15 MED ORDER — MORPHINE SULFATE 15 MG PO TABS
15.0000 mg | ORAL_TABLET | ORAL | Status: DC | PRN
  Administered 2012-07-15 – 2012-07-23 (×8): 15 mg via ORAL
  Filled 2012-07-15 (×8): qty 1

## 2012-07-15 MED ORDER — PREDNISONE 10 MG PO TABS: 10.0000 mg | ORAL_TABLET | Freq: Every day | ORAL | Status: DC

## 2012-07-15 MED ORDER — PREDNISONE 5 MG PO TABS: 5.0000 mg | ORAL_TABLET | Freq: Every day | ORAL | Status: DC

## 2012-07-15 MED ORDER — GABAPENTIN 600 MG PO TABS
1800.0000 mg | ORAL_TABLET | Freq: Every day | ORAL | Status: DC
  Administered 2012-07-15 – 2012-07-23 (×9): 1800 mg via ORAL
  Filled 2012-07-15 (×10): qty 3

## 2012-07-15 MED ORDER — ENOXAPARIN SODIUM 30 MG/0.3ML ~~LOC~~ SOLN
40.0000 mg | SUBCUTANEOUS | Status: DC
  Administered 2012-07-15 – 2012-07-19 (×5): 40 mg via SUBCUTANEOUS
  Filled 2012-07-15 (×6): qty 0.4

## 2012-07-15 MED ORDER — PREDNISONE 20 MG PO TABS
20.0000 mg | ORAL_TABLET | Freq: Every day | ORAL | Status: AC
  Administered 2012-07-19: 20 mg via ORAL
  Filled 2012-07-15: qty 1

## 2012-07-15 MED ORDER — PREDNISONE 50 MG PO TABS
50.0000 mg | ORAL_TABLET | Freq: Every day | ORAL | Status: AC
  Administered 2012-07-16: 50 mg via ORAL
  Filled 2012-07-15: qty 1

## 2012-07-15 MED ORDER — PREDNISONE 10 MG PO TABS
10.0000 mg | ORAL_TABLET | Freq: Every day | ORAL | Status: AC
  Administered 2012-07-20: 10 mg via ORAL
  Filled 2012-07-15 (×2): qty 1

## 2012-07-15 MED ORDER — PREDNISONE 5 MG PO TABS
5.0000 mg | ORAL_TABLET | Freq: Every day | ORAL | Status: DC
  Administered 2012-07-21 – 2012-07-24 (×4): 5 mg via ORAL
  Filled 2012-07-15 (×7): qty 1

## 2012-07-15 MED ORDER — PROCHLORPERAZINE EDISYLATE 5 MG/ML IJ SOLN
5.0000 mg | Freq: Four times a day (QID) | INTRAMUSCULAR | Status: DC | PRN
  Filled 2012-07-15: qty 2

## 2012-07-15 MED ORDER — GABAPENTIN 600 MG PO TABS
1200.0000 mg | ORAL_TABLET | Freq: Two times a day (BID) | ORAL | Status: DC
  Administered 2012-07-15 – 2012-07-24 (×18): 1200 mg via ORAL
  Filled 2012-07-15 (×20): qty 2

## 2012-07-15 MED ORDER — DIPHENHYDRAMINE HCL 12.5 MG/5ML PO ELIX: 12.5000 mg | ORAL_SOLUTION | Freq: Four times a day (QID) | ORAL | Status: DC | PRN

## 2012-07-15 MED ORDER — TRAZODONE HCL 50 MG PO TABS: 25.0000 mg | ORAL_TABLET | Freq: Every evening | ORAL | Status: DC | PRN

## 2012-07-15 MED ORDER — INSULIN ASPART 100 UNIT/ML ~~LOC~~ SOLN
0.0000 [IU] | Freq: Three times a day (TID) | SUBCUTANEOUS | Status: DC
  Administered 2012-07-15 – 2012-07-19 (×5): 2 [IU] via SUBCUTANEOUS

## 2012-07-15 MED ORDER — METHOCARBAMOL 500 MG PO TABS: 500.0000 mg | ORAL_TABLET | Freq: Four times a day (QID) | ORAL | Status: DC | PRN

## 2012-07-15 MED ORDER — POLYETHYLENE GLYCOL 3350 17 G PO PACK
17.0000 g | PACK | Freq: Every day | ORAL | Status: DC | PRN
  Filled 2012-07-15: qty 1

## 2012-07-15 MED ORDER — GUAIFENESIN-DM 100-10 MG/5ML PO SYRP: 5.0000 mL | ORAL_SOLUTION | Freq: Four times a day (QID) | ORAL | Status: DC | PRN

## 2012-07-15 MED ORDER — HYDROXYCHLOROQUINE SULFATE 200 MG PO TABS
200.0000 mg | ORAL_TABLET | Freq: Two times a day (BID) | ORAL | Status: DC
  Administered 2012-07-15 – 2012-07-24 (×18): 200 mg via ORAL
  Filled 2012-07-15 (×20): qty 1

## 2012-07-15 MED ORDER — PREDNISONE 20 MG PO TABS: 30.0000 mg | ORAL_TABLET | Freq: Every day | ORAL | Status: DC

## 2012-07-15 MED ORDER — PREDNISONE 20 MG PO TABS: 40.0000 mg | ORAL_TABLET | Freq: Every day | ORAL | Status: DC

## 2012-07-15 MED ORDER — ATORVASTATIN CALCIUM 20 MG PO TABS
20.0000 mg | ORAL_TABLET | Freq: Every day | ORAL | Status: DC
  Administered 2012-07-15 – 2012-07-23 (×9): 20 mg via ORAL
  Filled 2012-07-15 (×10): qty 1

## 2012-07-15 MED ORDER — DULOXETINE HCL 60 MG PO CPEP
60.0000 mg | ORAL_CAPSULE | Freq: Every day | ORAL | Status: DC
  Administered 2012-07-16 – 2012-07-24 (×9): 60 mg via ORAL
  Filled 2012-07-15 (×10): qty 1

## 2012-07-15 NOTE — Plan of Care (Addendum)
Overall Plan of Care [facility]) Patient Details Name: [name] MRN: [id number] DOB: [birth year]  Diagnosis:  Rehabilitation for lumbar spinal stenosis with neurogenic claudication  Co-morbidities: Diabetes mellitus  .  Hypertension  .  Thyroid disease  .  Arthritis  endstage changes bilateral knees/bilateral ankles.  .  High cholesterol  .  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  .  Depression, reactive  .  RA (rheumatoid arthritis)  has been off methotreaxte since 10/13.  .  Contusion of left knee    Functional Problem List  Patient demonstrates impairments in the following areas: Balance, Bladder, Bowel, Edema, Endurance, Medication Management, Motor, Pain, Perception, Safety, Sensory , Skin Integrity and Vision  Basic ADL's: grooming, bathing, dressing and toileting Advanced ADL's: simple meal preparation  Transfers:  bed mobility, bed to chair, toilet, tub/shower, car and furniture Locomotion:  ambulation and stairs  Additional Impairments:  Other  Anticipated Outcomes Item Anticipated Outcome  Eating/Swallowing    Basic self-care  Mod I  Tolieting  Mod I  Bowel/Bladder  Remain continent of bowel and bladder  Transfers  Mod I basic; S for car  Locomotion  Mod I gait; S for stairs  Communication    Cognition    Pain  2 or less on scale 0-10  Safety/Judgment    Other     Therapy Plan: PT Intensity: Minimum of 1-2 x/day ,45 to 90 minutes PT Frequency: 5 out of 7 days PT Duration Estimated Length of Stay: 7-10 days OT Intensity: Minimum of 1-2 x/day, 45 to 90 minutes OT Frequency: 5 out of 7 days OT Duration/Estimated Length of Stay: 10-12 days      Team Interventions: Item RN PT OT SLP SW TR Other  Self Care/Advanced ADL Retraining  x x      Neuromuscular Re-Education  x x      Therapeutic Activities  x x   x   UE/LE Strength Training/ROM  x x   x    UE/LE Coordination Activities  x x   x   Visual/Perceptual Remediation/Compensation  x x      DME/Adaptive Equipment Instruction  x x   x   Therapeutic Exercise  x x   x   Balance/Vestibular Training  x x   x   Patient/Family Education x x x   x   Cognitive Remediation/Compensation         Functional Mobility Training  x x   x   Ambulation/Gait Training  x x      Stair Training  x       Wheelchair Propulsion/Positioning  x x   x   Functional Electrical Stimulation  x       Community Reintegration  x x   x   Dysphagia/Aspiration Environmental consultant         Bladder Management x        Bowel Management x        Disease Management/Prevention  x x      Pain Management x x x      Medication Management x        Skin Care/Wound Management x x x      Splinting/Orthotics  x       Discharge Planning x x x   x   Psychosocial Support  x x   x  Team Discharge Planning: Destination: PT-Home ,OT- Home , SLP-  Projected Follow-up: PT-Home health PT, OT-  None, SLP-  Projected Equipment Needs: PT-None recommended by PT (already has RW and cane), OT-  , SLP-  Patient/family involved in discharge planning: PT- Patient,  OT-Patient, SLP-   MD ELOS: 7-10 days Medical Rehab Prognosis:  Excellent Assessment: 72 year old female with lumbar spinal stenosis as well as rheumatoid arthritis admitted for severe exacerbation of back pain with neurogenic claudication. Now requires CIR level PT, OT as well as 24 7 rehabilitation RN and M.D. Treatment team will concentrate on mobility, lower tremor he strength and range of motion. Goals are to achieve modified independent for mobility and ADL    See Team Conference Notes for weekly updates to the plan of care

## 2012-07-15 NOTE — Progress Notes (Signed)
Rehab admissions - Evaluated for possible admission.  I spoke with patient and she is very excited about coming to inpatient rehab.  Her daughter has been staying with her to assist.  Bed available on rehab and can admit to inpatient rehab today.  Call me for questions.  #[phone]

## 2012-07-15 NOTE — Progress Notes (Signed)
NURSING PROGRESS NOTE  [name] [id number] Discharge Data: 07/15/2012 11:37 AM Attending Provider: No att. providers found ALP:[id number],XBDZH N, MD     Lache E Schriefer to be D/C'd in pt rehab room 4025 per MD order. Report called to receiving RN (Ty).  All IV's discontinued with no bleeding noted. All belongings returned to patient for patient to take to 4025 meds tubed to unit.   Last Vital Signs:  Blood pressure 131/82, pulse 88, temperature 97.9 F (36.6 C), temperature source Oral, resp. rate 18, height 5' 3.5" (1.613 m), weight 112.8 kg (248 lb 10.9 oz), SpO2 92.00%.  Discharge Medication List   Medication List    STOP taking these medications       predniSONE 5 MG tablet  Commonly known as:  DELTASONE      TAKE these medications       acetaminophen 500 MG tablet  Commonly known as:  TYLENOL  Take 1,000 mg by mouth 2 (two) times daily. Takes with tramadol     aspirin 81 MG EC tablet  Take 81 mg by mouth daily.     atorvastatin 20 MG tablet  Commonly known as:  LIPITOR  Take 20 mg by mouth daily.     CALCIUM + D3 PO  Take 2 tablets by mouth daily.     DULoxetine 60 MG capsule  Commonly known as:  CYMBALTA  Take 60 mg by mouth daily.     furosemide 20 MG tablet  Commonly known as:  LASIX  Take 20 mg by mouth See admin instructions. Take 1 tab in the twice a day on mon, wed and fri. Take 1 tab daily the rest of the week.     gabapentin 600 MG tablet  Commonly known as:  NEURONTIN  Take 1,200-1,800 mg by mouth 3 (three) times daily. Take 2 tablets in the morning, 2 tablets in the middle of the day, and 3 tablets at night     levothyroxine 50 MCG tablet  Commonly known as:  SYNTHROID, LEVOTHROID  Take 50 mcg by mouth daily.     linagliptin 5 MG Tabs tablet  Commonly known as:  TRADJENTA  Take 5 mg by mouth daily.     methotrexate 2.5 MG tablet  Commonly known as:  RHEUMATREX  Take 7.5 mg by mouth once a week. Takes on wednesdays     metoprolol  succinate 100 MG 24 hr tablet  Commonly known as:  TOPROL-XL  Take 100 mg by mouth daily. Take with or immediately following a meal.     multivitamin with minerals Tabs  Take 1 tablet by mouth daily.     nitroGLYCERIN 0.4 MG SL tablet  Commonly known as:  NITROSTAT  Place 0.4 mg under the tongue every 5 (five) minutes as needed for chest pain.     olmesartan 40 MG tablet  Commonly known as:  BENICAR  Take 40 mg by mouth daily.     PLAQUENIL 200 MG tablet  Generic drug:  hydroxychloroquine  Take 200 mg by mouth 2 (two) times daily.     spironolactone 25 MG tablet  Commonly known as:  ALDACTONE  Take 25 mg by mouth.     traMADol 50 MG tablet  Commonly known as:  ULTRAM  Take 50 mg by mouth every 6 (six) hours as needed for pain.        Deatra Ina, RN

## 2012-07-15 NOTE — Clinical Social Work Note (Signed)
CSW received consult for SNF. Per CIR notes, patient will go to CIR today, 07/15/2012. CSW signing off, no other psychosocial concerns identified. Please re-consult as needed.  Janean Sark, [place] Worker Contact #: [phone]

## 2012-07-15 NOTE — PMR Pre-admission (Signed)
PMR Admission Coordinator Pre-Admission Assessment  Patient: [name] is an 72 y.o., female MRN: [id number] DOB: May 27, 1940 Height: 5' 3.5" (161.3 cm) Weight: 112.8 kg (248 lb 10.9 oz)              Insurance Information HMO:      PPO:       PCP:       IPA:       80/20:       OTHER:   PRIMARY: Medicare A/B      Policy#: [id number]      Subscriber: [place] CM Name:        Phone#:       Fax#:   Pre-Cert#:        Employer: Retired Benefits:  Phone #:       Name: Economist. Date: 11/17/06     Deduct: $1216      Out of Pocket Max: none      Life Max: unlimited CIR: 100%      SNF: 100 days Outpatient: 80%     Co-Pay: 20% Home Health: 100%      Co-Pay: none DME: 80%     Co-Pay: 20% Providers: patient's choice  SECONDARY: [place]      Policy#: [id number]      Subscriber: Asa Lente CM Name:        Phone#:       Fax#:   Pre-Cert#:        Employer: Retired Dealer:  Phone #: [phone]     Name:   Eff. Date:       Deduct:        Out of Pocket Max:        Life Max:   CIR:        SNF:   Outpatient:       Co-Pay:   Home Health:        Co-Pay:   DME:       Co-Pay:    Emergency Contact Information Contact Information   Name Relation Home Work [place] [phone]  [phone]   Fonnie Jarvis Daughter   [phone]   Marina Gravel [phone]       Current Medical History  Patient Admitting Diagnosis: lumbar spondylosis with multifactorial/multilevel stenosis with neurogenic claudication/substantial proximal leg weakness   History of Present Illness: A 72 y.o. female with history of RA, DM, with diabetic neuropathy, meningioma resection; who was admitted on 07/12/12 with shooting pain in BLE with RLE instability and inability to walk. Patient with reports of fall three weeks PTA with soreness left hip and left knee. MRI lumbar spine revealed moderate to marked spinal stenosis L4-5, moderate stenosis L3-4 and mild to moderate stenosis L2-3.  She was evaluated by Dr. Rita Ohara who recommended PT/OT for proximal LE weakness as patient with multiple cormorbidities. Was started on IV steroids to help with pain management. PT evaluation done and patient limited by pain and endurance. PT, MD recommending CIR.   Past Medical History  Past Medical History  Diagnosis Date  . Diabetes mellitus   . Hypertension   . Thyroid disease   . Arthritis   . High cholesterol   . 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  Prior Rehab/Hospitalizations: Had outpatient therapy last year.  Current Medications  Current facility-administered medications:0.9 %  sodium chloride infusion, 250 mL, Intravenous, PRN, Derrill Kay, MD;  acetaminophen (TYLENOL) tablet 650 mg, 650 mg, Oral, Q4H PRN, Rhetta Mura Schorr, NP, 650 mg at 07/14/12 0503;  atorvastatin (LIPITOR) tablet 20 mg, 20 mg, Oral, q1800, Derrill Kay, MD, 20 mg at 07/14/12 1812;  DULoxetine (CYMBALTA) DR capsule 60 mg, 60 mg, Oral, Daily, Derrill Kay, MD, 60 mg at 07/15/12 0919 furosemide (LASIX) tablet 20 mg, 20 mg, Oral, Custom, Derrill Kay, MD, 20 mg at 07/15/12 4656;  furosemide (LASIX) tablet 20 mg, 20 mg, Oral, Custom, Derrill Kay, MD, 20 mg at 07/14/12 1811;  gabapentin (NEURONTIN) tablet 1,200 mg, 1,200 mg, Oral, BID WC, Derrill Kay, MD, 1,200 mg at 07/15/12 0805;  gabapentin (NEURONTIN) tablet 1,800 mg, 1,800 mg, Oral, QHS, Derrill Kay, MD, 1,800 mg at 07/14/12 2206 hydroxychloroquine (PLAQUENIL) tablet 200 mg, 200 mg, Oral, BID, Derrill Kay, MD, 200 mg at 07/15/12 0918;  insulin aspart (novoLOG) injection 0-15 Units, 0-15 Units, Subcutaneous, TID WC, Verlee Monte, MD, 2 Units at 07/15/12 0806;  insulin detemir (LEVEMIR) injection 5 Units, 5 Units, Subcutaneous, QHS, Melton Alar, PA, 5 Units at 07/14/12 2206 levothyroxine (SYNTHROID, LEVOTHROID) tablet 50 mcg, 50 mcg, Oral, QAC breakfast, Derrill Kay, MD, 50 mcg at 07/15/12 0805;  linagliptin (TRADJENTA) tablet 5 mg, 5 mg, Oral, Daily, Derrill Kay, MD, 5 mg at 07/15/12 8127;  methylPREDNISolone sodium succinate (SOLU-MEDROL) 125 mg/2 mL injection 60 mg, 60 mg, Intravenous, Q8H, Jonetta Osgood, MD, 60 mg at 07/15/12 0533 metoprolol succinate (TOPROL-XL) 24 hr tablet 100 mg, 100 mg, Oral, Daily, Derrill Kay, MD, 100 mg at 07/15/12 5170;  morphine 2 MG/ML injection 1 mg, 1 mg, Intravenous, Q2H PRN, Derrill Kay, MD, 1 mg at 07/13/12 1930;  sodium chloride 0.9 % injection 3 mL, 3 mL, Intravenous, Q12H, Derrill Kay, MD, 3 mL at 07/14/12 2206;  sodium chloride 0.9 % injection 3 mL, 3 mL, Intravenous, PRN, Derrill Kay, MD, 3 mL at 07/15/12 0920 traMADol (ULTRAM) tablet 50-100 mg, 50-100 mg, Oral, Q6H PRN, Jonetta Osgood, MD, 50 mg at 07/15/12 0174  Patients Current Diet: Carb Control  Precautions / Restrictions Precautions Precautions: Fall Precaution Comments: R knee may buckle Restrictions Weight Bearing Restrictions: No   Prior Activity Level Household: Has been homebound since October.  Goes out for MD appts.  Not Driving.  Home Assistive Devices / Equipment Home Assistive Devices/Equipment: Eyeglasses;Walker (specify type) Home Adaptive Equipment: Bedside commode/3-in-1;Straight cane;Shower chair with back  Prior Functional Level Prior Function Level of Independence: Independent with assistive device(s) Driving: No Vocation: Retired Comments: Used cane to [employer] and was waiting for walker to arrive.  Current Functional Level Cognition  Arousal/Alertness: Awake/alert Overall Cognitive Status: Appears within functional limits for tasks assessed/performed Orientation Level: Oriented X4    Extremity Assessment (includes Sensation/Coordination)     RLE ROM/Strength/Tone: Deficits RLE  ROM/Strength/Tone Deficits: grossly 3-/5    ADLs       Mobility  Bed Mobility: Supine to Sit;Sitting - Scoot to Edge of Bed Supine to Sit: 4: Min assist;HOB elevated;With rails Sitting - Scoot to Edge of Bed: 4: Min assist    Transfers  Transfers: Sit to Stand;Stand to [employer] Transfers Sit to Stand: 4: Min assist;3: Mod assist Sit to Stand: Patient Percentage: 60% Stand to Sit: 4: Min guard Stand to Sit: Patient Percentage: 60% Stand Pivot Transfers: 3: Mod assist Transfer via Lift Equipment: Geneticist, molecular / Gait /  Stairs / Emergency planning/management officer  Ambulation/Gait Ambulation/Gait Assistance: 4: Min assist;3: Mod assist Ambulation Distance (Feet): 45 Feet (then 90 ft) Assistive device: Rolling walker Ambulation/Gait Assistance Details: pt stated incr. pain LLE on second walk, noted to slow speed and walk more cautiosly. Gait Pattern: Step-through pattern;Antalgic Gait velocity: Pt then ambulated ina circle from the bed , then sat on [facility] after brought beside bed. R leg did not buckle.    Posture / Balance      Special needs/care consideration BiPAP/CPAP No CPM No Continuous Drip IV No Dialysis No         Life Vest No Oxygen No Special Bed No Trach Size No Wound Vac (area) No      Skin Swelling at times.                             Bowel mgmt: Had BM X 2 on 07/14/12 Bladder mgmt: Voiding on BSC without difficulty Diabetic mgmt Yes.  Managed with oral medications at home.     Previous Home Environment Living Arrangements: Spouse/significant other Lives With: Spouse Available Help at Discharge: Family;Available 24 hours/day Type of Home: House Home Layout: Two level;Able to live on main level with bedroom/bathroom Home Access: Stairs to enter Entrance Stairs-Number of Steps: 1 Bathroom Shower/Tub: Walk-in shower Home Care Services: No Additional Comments: Has ordered rolling walker from [place] and it is supposed to have been mailed to the  pt. last week but she hasn't received it yet.  Discharge Living Setting Plans for Discharge Living Setting: Patient's home;House;Lives with (comment) (Lives with husband.  Dtr staying with her now.) Type of Home at Discharge: House Discharge Home Layout: Two level;Able to live on main level with bedroom/bathroom Discharge Home Access: Stairs to enter Entrance Stairs-Number of Steps: 1/2 small step garage and front entry. Do you have any problems obtaining your medications?: No  Social/Family/Support Systems Patient Roles: Spouse;Parent (Has 6 children.) Contact Information: Abeera Flannery - spouse; Fonnie Jarvis - Dtr; Rosita Fire - son Anticipated Caregiver: Dtr - [name] Anticipated Caregiver's Contact Information: Alcide Evener (c) [phone] Ability/Limitations of Caregiver: Dtr has taken leave from work. Caregiver Availability: 24/7 Discharge Plan Discussed with Primary Caregiver: Yes Is Caregiver In Agreement with Plan?: Yes Does Caregiver/Family have Issues with Lodging/Transportation while Pt is in Rehab?: No  Goals/Additional Needs Patient/Family Goal for Rehab: PT mod I, OT S/Min A, no ST needs Expected length of stay: 7-10 days Cultural Considerations: None Dietary Needs: Carb Mod Med Cal diabetic diet. Equipment Needs: TBD Pt/Family Agrees to Admission and willing to participate: Yes Program Orientation Provided & Reviewed with Pt/Caregiver Including Roles  & Responsibilities: Yes  Decrease burden of Care through IP rehab admission:   Not applicable  Possible need for SNF placement upon discharge: Very unlikely.  Patient Condition: This patient's condition remains as documented in the Consult dated 07/15/12, in which the Rehabilitation Physician determined and documented that the patient's condition is appropriate for intensive rehabilitative care in an inpatient rehabilitation facility.  Preadmission Screen Completed By:  Retta Diones, 07/15/2012 9:53  AM ______________________________________________________________________   Discussed status with Dr. Naaman Plummer on 07/15/12 at 68 and received telephone approval for admission today.  Admission Coordinator:  Retta Diones, time1003/Date02/27/14

## 2012-07-15 NOTE — Progress Notes (Signed)
Patient coming from 5500 admit to inpatient rehab at 11:47am.  Patient alert and oriented x4, orient patient to staffs and unit.  Discussed safety plan and teach back demonstration.  Admission packet provided, side rails up x3, call bell within reach.

## 2012-07-15 NOTE — Clinical Social Work Psychosocial (Signed)
Clinical Social Work Department BRIEF PSYCHOSOCIAL ASSESSMENT 07/15/2012  Patient:  [name]     Account Number:  [id number]     Admit date:  07/12/2012  Clinical Social Worker:  Trudie Buckler  Date/Time:  07/14/2012 11:49 AM  Referred by:  Physician  Date Referred:  07/13/2012 Referred for  SNF Placement   Other Referral:   Interview type:  Patient Other interview type:    PSYCHOSOCIAL DATA Living Status:  HUSBAND Admitted from facility:   Level of care:   Primary support name:  Mikeal Hawthorne Primary support relationship to patient:  SPOUSE Degree of support available:   Adequate. At patient's bedside.    CURRENT CONCERNS Current Concerns  Post-Acute Placement   Other Concerns:    SOCIAL WORK ASSESSMENT / PLAN CSW received consult re: SNF placement. CSW met with patient, spouse and daughter at bedside. CSW explained her role as CSW and provided support on hospital admission. Patient stated she wanted to go to CIR for rehab but was agreeable to SNF as a back up. Patient stated she is in significant pain, but has notified her beside Therapist, sports. CSW confirmed patient received Tylenol. CSW will continue to follow patient for disposition needs as a back up to CIR.   Assessment/plan status:  Information/Referral to [employer] Other assessment/ plan:   Information/referral to community resources:   SNF List    PATIENT'S/FAMILY'S RESPONSE TO PLAN OF CARE: Patient and family thanked CSW for support and assisting in d/c needs.   Janean Sark, [place] Worker Contact #: [phone]

## 2012-07-15 NOTE — Discharge Summary (Signed)
Physician Discharge Summary  [name] ZOX:[id number] DOB: 10-09-1940 DOA: 07/12/2012  PCP: Maximino Greenland, MD  Admit date: 07/12/2012 Discharge date: 07/15/2012  Time spent: 40  minutes  Recommendations for Outpatient Follow-up:  Discharge to [facility] for intensive therapy.   Follow up with PCP in 2 weeks re:  DM, HTN Follow up at [facility] as previously scheduled regarding meningioma.    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  Still unable to walk  Diet recommendation: heart healthy, carb modified  Filed Weights   07/12/12 2242  Weight: 112.8 kg (248 lb 10.9 oz)    History of present illness at the time of admission:  72 yo female with h/o rheumatoid arthritis, meningioma, hypothyroidism comes in with less than 24 hours of acute lower back pain with shooting pains also down to both of her legs. She has no h/o chronic back pain. She has had no trauma except a fall 3 weeks ago which she did not suffer from any injuries then or back pain until yesterday. Denies any fevers. No dyruria. Having bilateral lower extremity weakness and could not walk today because of weakness so came to ED. No urinary or bowel incontinence. No saddle anesthesia. Her typical RA pain is in her hands. She has been off remicade and multiple other agents for her rheum arthritis for several months.   Hospital Course:   MRI lumbar spine revealed moderate to marked spinal stenosis L4-5, moderate stenosis L3-4 and mild to moderate stenosis L2-3. She was evaluated by Dr. Rita Ohara who recommended PT/OT for proximal LE weakness as patient with multiple cormorbidities. Was started on IV steroids to help  with pain management. PT evaluation done and patient limited by pain and endurance. PT, MD recommending CIR  Spinal stenosis  MRI lumbar spine revealed moderate to marked spinal stenosis L4-5, moderate stenosis L3-4 and mild to moderate stenosis L2-3. She was evaluated by Dr. Rita Ohara, Neurosurgeon, who recommended PT/OT for proximal LE weakness as the patient has multiple comorbidities and it would likely be best to avoid surgery.  She was started on IV steroids and tramadol to help with pain management. Her steroids have been tapered to prednisone.  Her pain has decreased significantly.  PT evaluation done and patient limited by pain and endurance. Physical therapy recommended CIR.  She was evaluated by CIR and accepted.  She is medically stable for discharge to CIR.  Rheumatoid arthritis  Patient was on biological agent, methotrexate and prednisone.  Methotrexate currently being held by outpatient physician.  Steroids were increased temporarily inpatient.  She is now on a prednisone taper and will return to taking 5 mg daily in 1 week.    Asymptomatic bacteriuria  Multiple bacteria and squamous epithelial cells, no pus cells, LE or nitrites to suggest UTI. Culture shows 60,000 colonies of multiple morphotypes.  Diabetes mellitus type 2  Patient is on Linagliptin, with the increase in steroids, blood sugar expected to be elevated.    Consultations:  Neurosurgery  Discharge Exam: Filed Vitals:   07/14/12 2120 07/14/12 2206 07/15/12 0544 07/15/12 0918  BP: 164/81 152/86 152/72 131/82  Pulse: 83  81 88  Temp: 98.6 F (37 C)  97.9 F (36.6 C)  TempSrc: Oral  Oral   Resp: 18  18   Height:      Weight:      SpO2: 91%  92%    General: Alert and awake, oriented x3, not in any acute distress. Very pleasant HEENT: anicteric sclera, pupils reactive to light and accommodation, EOMI  CVS: S1-S2 clear, no murmur rubs or gallops  Chest: clear to auscultation bilaterally, no wheezing, rales or  rhonchi  Abdomen: obese, soft nontender, nondistended, normal bowel sounds, no organomegaly  Extremities: no cyanosis, clubbing or edema noted bilaterally. Able to move all 4.   Discharge Instructions     Medication List    STOP taking these medications       predniSONE 5 MG tablet  Commonly known as:  DELTASONE      TAKE these medications       acetaminophen 500 MG tablet  Commonly known as:  TYLENOL  Take 1,000 mg by mouth 2 (two) times daily. Takes with tramadol     aspirin 81 MG EC tablet  Take 81 mg by mouth daily.     atorvastatin 20 MG tablet  Commonly known as:  LIPITOR  Take 20 mg by mouth daily.     CALCIUM + D3 PO  Take 2 tablets by mouth daily.     DULoxetine 60 MG capsule  Commonly known as:  CYMBALTA  Take 60 mg by mouth daily.     furosemide 20 MG tablet  Commonly known as:  LASIX  Take 20 mg by mouth See admin instructions. Take 1 tab in the twice a day on mon, wed and fri. Take 1 tab daily the rest of the week.     gabapentin 600 MG tablet  Commonly known as:  NEURONTIN  Take 1,200-1,800 mg by mouth 3 (three) times daily. Take 2 tablets in the morning, 2 tablets in the middle of the day, and 3 tablets at night     levothyroxine 50 MCG tablet  Commonly known as:  SYNTHROID, LEVOTHROID  Take 50 mcg by mouth daily.     linagliptin 5 MG Tabs tablet  Commonly known as:  TRADJENTA  Take 5 mg by mouth daily.     methotrexate 2.5 MG tablet  Commonly known as:  RHEUMATREX  Take 7.5 mg by mouth once a week. Takes on wednesdays     metoprolol succinate 100 MG 24 hr tablet  Commonly known as:  TOPROL-XL  Take 100 mg by mouth daily. Take with or immediately following a meal.     multivitamin with minerals Tabs  Take 1 tablet by mouth daily.     nitroGLYCERIN 0.4 MG SL tablet  Commonly known as:  NITROSTAT  Place 0.4 mg under the tongue every 5 (five) minutes as needed for chest pain.     olmesartan 40 MG tablet  Commonly known as:  BENICAR   Take 40 mg by mouth daily.     PLAQUENIL 200 MG tablet  Generic drug:  hydroxychloroquine  Take 200 mg by mouth 2 (two) times daily.     spironolactone 25 MG tablet  Commonly known as:  ALDACTONE  Take 25 mg by mouth.     traMADol 50 MG tablet  Commonly known as:  ULTRAM  Take 50 mg by mouth every 6 (six) hours as needed for pain.          The results of significant diagnostics from this hospitalization (including imaging, microbiology, ancillary and laboratory) are listed below for reference.  Significant Diagnostic Studies: Dg Lumbar Spine Complete  07/12/2012  *RADIOLOGY REPORT*  Clinical Data: Fall, hip pain.  LUMBAR SPINE - COMPLETE 4+ VIEW  Comparison: None  Findings: Diffuse degenerative facet disease throughout the lumbar spine.  Associated slight anterolisthesis of L4 on L5.  Slight disc space narrowing in the lower lumbar spine.  Diffuse osteopenia.  No acute bony abnormality.  No fracture or acute subluxation.  SI joints are symmetric and unremarkable.  IMPRESSION: Degenerative changes.  Mild osteopenia.  No acute findings.   Original Report Authenticated By: Rolm Baptise, M.D.    Dg Hip Complete Left  07/12/2012  *RADIOLOGY REPORT*  Clinical Data: Fall, left hip pain  LEFT HIP - COMPLETE 2+ VIEW  Comparison: None.  Findings: No fracture or dislocation is seen.  Visualized bony pelvis appears intact.  Degenerative changes of the bilateral hips and lower lumbar spine.  IMPRESSION: No fracture or dislocation is seen.   Original Report Authenticated By: Julian Hy, M.D.    Mr Lumbar Spine Wo Contrast  07/12/2012  *RADIOLOGY REPORT*  Clinical Data: Increasing low back pain with any difficulty ambulating is secondary to the pain.  MRI LUMBAR SPINE WITHOUT CONTRAST  Technique:  Multiplanar and multiecho pulse sequences of the lumbar spine were obtained without intravenous contrast.  Comparison: Plain film examination 07/12/2012.  No comparison MR.  Findings: Last fully open  disc space is labeled L5-S1.  Present examination incorporates from T10-11 disc space through the lower sacrum.  Conus just below the T12-L1 disc space.  Prominent edema within the subcutaneous fat.  Endplate reactive changes most notable superior endplate L5.  Small hemangioma suspected inferior endplate of L1.  No worrisome bony abnormality.  Right renal 5 cm cyst incompletely assessed on the present exam.  T10-11:  Negative.  T11-12:  Left-sided facet joint degenerative changes and mild left- sided foraminal narrowing.  T12-L1:  Mild bulge.  L1-2:  Facet joint degenerative changes.  Mild bulge. Dorsal epidural fat. Slightly short pedicles.  Multifactorial mild spinal stenosis.  L2-3: Facet joint degenerative changes. Mild bulge.  Dorsal epidural fat.  Slightly short pedicles.  Multifactorial mild to slightly moderate spinal stenosis.  L3-4:  Facet joint degenerative changes.  Ligament flavum hypertrophy.  Short pedicles.  Minimal bulge.  Multifactorial moderate spinal stenosis.  L4-5:  Prominent bilateral facet joint degenerative changes.  2.8 mm anterior slip of L4.  Ligamentum flavum hypertrophy.  Bulge. Short pedicles.  Multifactorial moderate to marked spinal stenosis. Mild bilateral foraminal narrowing.  L5-S1: Moderate facet joint degenerative changes greater on the right.  Minimal bulge and spur greater left foraminal position without nerve root compression.  IMPRESSION:  Summary of pertinent findings include:  L4-5 multifactorial moderate to marked spinal stenosis.  Mild bilateral foraminal narrowing.  L3-4 multifactorial moderate spinal stenosis.  L2-3 multifactorial mild to slightly moderate spinal stenosis.  L1-2 multifactorial mild spinal stenosis.  L5-S1 moderate facet joint degenerative changes.  Please see above for further detail.   Original Report Authenticated By: Genia Del, M.D.    Dg Knee Complete 4 Views Left  07/12/2012  *RADIOLOGY REPORT*  Clinical Data: Hip pain, knee pain.  Remote  history of fall.  LEFT KNEE - COMPLETE 4+ VIEW  Comparison: None.  Findings: Mild degenerative changes within the left knee.  No visible fracture.  Small joint effusion.  No subluxation or dislocation.  IMPRESSION: Small joint effusion.  Moderate degenerative changes.  No acute bony abnormality.   Original Report Authenticated By: Rolm Baptise, M.D.  Dg Knee Complete 4 Views Right  07/12/2012  *RADIOLOGY REPORT*  Clinical Data: Fall, knee pain.  RIGHT KNEE - COMPLETE 4+ VIEW  Comparison: 07/12/2010  Findings: Moderate tricompartment degenerative changes.  Small joint effusion.  No acute bony abnormality.  No acute fracture, subluxation or dislocation.  IMPRESSION: Moderate tricompartment degenerative changes with joint effusion. No acute findings.   Original Report Authenticated By: Rolm Baptise, M.D.       Microbiology: Recent Results (from the past 240 hour(s))  URINE CULTURE     Status: None   Collection Time    07/12/12 12:34 PM      Result Value Range Status   Specimen Description URINE, RANDOM   Final   Special Requests ADDED [id number]   Final   Culture  Setup Time 07/12/2012 18:07   Final   Colony Count 60,000 COLONIES/ML   Final   Culture     Final   Value: Culture reincubated for better growth Multiple bacterial morphotypes present, none predominant. Suggest appropriate recollection if clinically indicated.   Report Status 07/13/2012 FINAL   Final     Labs: Basic Metabolic Panel:  Recent Labs Lab 07/12/12 1051 07/13/12 0655 07/14/12 0700  NA 136 139 136  K 3.9 3.8 4.1  CL 98 100 97  CO2 _0 GLUCOSE 116* 102* 155*  BUN _1 CREATININE 0.98 0.86 0.90  CALCIUM 9.1 9.1 9.3   CBC:  Recent Labs Lab 07/12/12 1051 07/13/12 0655 07/14/12 0700  WBC 9.8 8.5 7.8  NEUTROABS 7.4  --   --   HGB 10.1* 10.6* 11.1*  HCT 31.5* 33.2* 34.1*  MCV 84.5 84.3 84.4  PLT 260 287 348   Cardiac Enzymes:  Recent Labs Lab 07/12/12 2245  CKTOTAL 57   CBG:  Recent  Labs Lab 07/14/12 0749 07/14/12 1141 07/14/12 1718 07/14/12 2127 07/15/12 0735  GLUCAP 148* 133* 144* 162* 147*     Signed:  Karen Kitchens [phone]  [facility] 07/15/2012, 10:41 AM   Attending Patient seen and examined, agree with the assessment and plan as outlined above. Significantly better, less back pain, more ambulatory-being transferred to CIR today.  S Remiel Corti

## 2012-07-15 NOTE — Progress Notes (Signed)
Physical Therapy Treatment Patient Details Name: [name] MRN: [id number] DOB: 1941-04-04 Today's Date: 07/15/2012 Time: 8343-7357 PT Time Calculation (min): 28 min  PT Assessment / Plan / Recommendation Comments on Treatment Session  Pt. much improved in sit/stand, ambulated x 90 ft. Pt. c/o increase in L leg pain with increase in activity but stated tolerable. Pt. plans for CIR today.    Follow Up Recommendations  CIR     Does the patient have the potential to tolerate intense rehabilitation     Barriers to Discharge        Equipment Recommendations  None recommended by PT    Recommendations for Other Services    Frequency Min 3X/week   Plan Discharge plan remains appropriate    Precautions / Restrictions Precautions Precautions: Fall   Pertinent Vitals/Pain     Mobility  Transfers Sit to Stand: 4: Min assist;3: Mod assist Stand to Sit: 4: Min guard Details for Transfer Assistance: cues for use of UE's to push up form surfaces, use armrests when available. Pt practiced x 3 for built up seat of straight chair as pt reports difficulty from low seat and incr. knee pain.  Ambulation/Gait Ambulation/Gait Assistance: 4: Min assist;3: Mod assist Ambulation Distance (Feet): 45 Feet (then 90 ft) Assistive device: Rolling walker Ambulation/Gait Assistance Details: pt stated incr. pain LLE on second walk, noted to slow speed and walk more cautiously. Gait Pattern: Step-through pattern;Antalgic    Exercises General Exercises - Lower Extremity Quad Sets: AROM;Both;10 reps Long Arc Quad: AROM;Both;10 reps Hip Flexion/Marching: AROM;Both;10 reps   PT Diagnosis:    PT Problem List:   PT Treatment Interventions:     PT Goals Acute Rehab PT Goals Pt will go Sit to Stand: with min assist PT Goal: Sit to Stand - Progress: Progressing toward goal Pt will go Stand to Sit: with supervision PT Goal: Stand to Sit - Progress: Progressing toward goal Pt will Ambulate: 51 -  150 feet;with supervision;with rolling walker PT Goal: Ambulate - Progress: Updated due to goal met  Visit Information  Last PT Received On: 07/15/12 Assistance Needed: +1    Subjective Data  Subjective: I feel some pain in my L foot down my leg now that I have been walking  more.   Cognition  Cognition Overall Cognitive Status: Appears within functional limits for tasks assessed/performed    Balance     End of Session PT - End of Session Equipment Utilized During Treatment: Gait belt Activity Tolerance: Patient tolerated treatment well Patient left: with call bell/phone within reach   GP     [name] 07/15/2012, 9:25 AM [phone]

## 2012-07-15 NOTE — H&P (Addendum)
Chief Complaint   Patient presents with   .  Multilevel spinal stenosis with neurogenic claudication and LE weakness.   :  HPI: [name] is a 72 y.o. female with history of RA, DM, with diabetic neuropathy, meningioma resection; who was admitted on 07/12/12 with shooting pain in BLE with RLE instability and inability to walk. Patient with reports of fall three weeks PTA with soreness left hip and left knee. MRI lumbar spine revealed moderate to marked spinal stenosis L4-5, moderate stenosis L3-4 and mild to moderate stenosis L2-3. She was evaluated by Dr. Rita Ohara who recommended PT/OT for proximal LE weakness as patient with multiple cormorbidities. Was started on IV steroids which has helped with pain management and overall mobility. Therapy initiated and patient noted to be limited by LE weakness and pain. Therapy team recommended CIR  Worse pain is in the left lower extremity from the back to the ankle area Overall feels better since admission but feels weak. Review of Systems  Eyes: Positive for double vision (improved) and pain (due to meningioma).  Gastrointestinal: Negative for heartburn, nausea and constipation.  Genitourinary: Positive for urgency.  Musculoskeletal: Positive for joint pain (Endstage DJD bilateral knees and ankles-needs replacement. ).  Neurological: Positive for headaches.  Psychiatric/Behavioral: Positive for depression (mood slowly improving). The patient has insomnia (due to pain).   Past Medical History   Diagnosis  Date   .  Diabetes mellitus    .  Hypertension    .  Thyroid disease    .  Arthritis      endstage changes bilateral knees/bilateral ankles.   .  High cholesterol    .  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  02/17/2012     Continue diplopia, left eye pain and left headaches.   .  Depression, reactive    .  RA (rheumatoid arthritis)      has been off methotreaxte since 10/13.   .  Contusion of left knee      due to  fall 1/14.    Past Surgical History   Procedure  Laterality  Date   .  Abdominal hysterectomy     .  Ovary surgery     .  Shoulder surgery     .  Brain surgery       Gamma knife 10/13. Needs repeat spring '14    Family History   Problem  Relation  Age of Onset   .  Diabetes  Mother    .  Hypertension  Father    .  Diabetes  Sister    .  Diabetes  Brother    .  Hypertension  Brother    .  Heart attack  Mother    .  Lung cancer  Father     Social History: Married. Retired [employer]. Used cane/walker PTA due to balance/vision issues from meningioma. Has been sedentary and inactive since her surgery. Husband 46 yrs old and daughters have been assisting with 24 hours care since 10/13. She reports that she has never smoked. She does not have any smokeless tobacco history on file. She reports that she does not drink alcohol or use illicit drugs  Allergies   Allergen  Reactions   .  Codeine  Swelling     Facial swelling   .  Remicade (Infliximab)      "sent me into shock"   .  Zestril (Lisinopril)  Swelling     Face and neck swelling  Medications Prior to Admission   Medication  Sig  Dispense  Refill   .  acetaminophen (TYLENOL) 500 MG tablet  Take 1,000 mg by mouth 2 (two) times daily. Takes with tramadol     .  aspirin 81 MG EC tablet  Take 81 mg by mouth daily.     [name]  atorvastatin (LIPITOR) 20 MG tablet  Take 20 mg by mouth daily.     .  Calcium Carb-Cholecalciferol (CALCIUM + D3 PO)  Take 2 tablets by mouth daily.     .  DULoxetine (CYMBALTA) 60 MG capsule  Take 60 mg by mouth daily.     .  furosemide (LASIX) 20 MG tablet  Take 20 mg by mouth See admin instructions. Take 1 tab in the twice a day on mon, wed and fri. Take 1 tab daily the rest of the week.     .  gabapentin (NEURONTIN) 600 MG tablet  Take 1,200-1,800 mg by mouth 3 (three) times daily. Take 2 tablets in the morning, 2 tablets in the middle of the day, and 3 tablets at night     .  hydroxychloroquine (PLAQUENIL) 200 MG  tablet  Take 200 mg by mouth 2 (two) times daily.     [name]  levothyroxine (SYNTHROID, LEVOTHROID) 50 MCG tablet  Take 50 mcg by mouth daily.     [name]  linagliptin (TRADJENTA) 5 MG TABS tablet  Take 5 mg by mouth daily.     .  methotrexate (RHEUMATREX) 2.5 MG tablet  Take 7.5 mg by mouth once a week. Takes on wednesdays     .  metoprolol succinate (TOPROL-XL) 100 MG 24 hr tablet  Take 100 mg by mouth daily. Take with or immediately following a meal.     .  Multiple Vitamin (MULTIVITAMIN WITH MINERALS) TABS  Take 1 tablet by mouth daily.     .  nitroGLYCERIN (NITROSTAT) 0.4 MG SL tablet  Place 0.4 mg under the tongue every 5 (five) minutes as needed for chest pain.     [name]  olmesartan (BENICAR) 40 MG tablet  Take 40 mg by mouth daily.     [name]  spironolactone (ALDACTONE) 25 MG tablet  Take 25 mg by mouth.     .  traMADol (ULTRAM) 50 MG tablet  Take 50 mg by mouth every 6 (six) hours as needed for pain.      Home:  Home Living  Lives With: Spouse  Available Help at Discharge: Family;Available 24 hours/day  Type of Home: House  Home Access: Stairs to enter  [employer] of Steps: 1  Home Layout: Two level;Able to live on main level with bedroom/bathroom  Bathroom Shower/Tub: Walk-in shower  Home Adaptive Equipment: Bedside commode/3-in-1;Straight cane;Shower chair with back  Additional Comments: Has ordered rolling walker from [place] and it is supposed to have been mailed to the pt. last week but she hasn't received it yet.  Functional History:  Prior Function  Driving: No  Vocation: Retired  Comments: Used cane to [employer] and was waiting for walker to arrive.  Functional Status:  Mobility:  Bed Mobility  Bed Mobility: Supine to Sit;Sitting - Scoot to Edge of Bed  Supine to Sit: 4: Min assist;HOB elevated;With rails  Sitting - Scoot to Edge of Bed: 4: Min assist  Transfers  Transfers: Sit to Stand;Stand to [employer] Transfers  Sit to Stand: 4: Min assist;3: Mod assist   Sit to Stand: Patient Percentage: 60%  Stand to Sit:  4: Min guard  Stand to Sit: Patient Percentage: 60%  Stand Pivot Transfers: 3: Mod assist  Transfer via Lift Equipment: Marketing executive  Ambulation/Gait  Ambulation/Gait Assistance: 4: Min assist;3: Mod assist  Ambulation Distance (Feet): 45 Feet (then 90 ft)  Assistive device: Rolling walker  Ambulation/Gait Assistance Details: pt stated incr. pain LLE on second walk, noted to slow speed and walk more cautiosly.  Gait Pattern: Step-through pattern;Antalgic  Gait velocity: Pt then ambulated ina circle from the bed , then sat on [facility] after brought beside bed. R leg did not buckle.   ADL:   Cognition:  Cognition  Arousal/Alertness: Awake/alert  Orientation Level: Oriented X4  Cognition  Overall Cognitive Status: Appears within functional limits for tasks assessed/performed  Arousal/Alertness: Awake/alert  Orientation Level: Appears intact for tasks assessed  Behavior During Session: [facility] for tasks performed  Physical Exam:  Blood pressure 131/82, pulse 88, temperature 97.9 F (36.6 C), temperature source Oral, resp. rate 18, height 5' 3.5" (1.613 m), weight 112.8 kg (248 lb 10.9 oz), SpO2 92.00%.   HEENT: normal Cardio: RRR and no murmur Resp: CTA B/L and No Labored breathing GI: BS positive and obese Extremity:  Pulses positive and No Edema Skin:   Intact Neuro: Alert/Oriented, Cranial Nerve II-XII normal, Abnormal Sensory parasthesia in Legs, Abnormal Motor 4/5 in Bilateral quads TA and Gastroc, 3-/5 HF 5/5 in UEs and Tone:  Within Normal Limits Musc/Skel:  Other No MCP or PIP tenderness no RA deformities Gen NAD Results for orders placed during the hospital encounter of 07/12/12 (from the past 48 hour(s))   GLUCOSE, CAPILLARY Status: Abnormal    Collection Time    07/13/12 5:02 PM   Result  Value  Range    Glucose-Capillary  206 (*)  70 - 99 mg/dL   GLUCOSE, CAPILLARY Status: Abnormal    Collection Time    07/13/12 10:19 PM    Result  Value  Range    Glucose-Capillary  225 (*)  70 - 99 mg/dL   BASIC METABOLIC PANEL Status: Abnormal    Collection Time    07/14/12 7:00 AM   Result  Value  Range    Sodium  136  135 - 145 mEq/L    Potassium  4.1  3.5 - 5.1 mEq/L    Chloride  97  96 - 112 mEq/L    CO2  29  19 - 32 mEq/L    Glucose, Bld  155 (*)  70 - 99 mg/dL    BUN  21  6 - 23 mg/dL    Creatinine, Ser  0.90  0.50 - 1.10 mg/dL    Calcium  9.3  8.4 - 10.5 mg/dL    GFR calc non Af Amer  63 (*)  >90 mL/min    GFR calc Af Amer  73 (*)  >90 mL/min    Comment:      The eGFR has been calculated     using the CKD EPI equation.     This calculation has not been     validated in all clinical     situations.     eGFR's persistently     <90 mL/min signify     possible Chronic Kidney Disease.   CBC Status: Abnormal    Collection Time    07/14/12 7:00 AM   Result  Value  Range    WBC  7.8  4.0 - 10.5 K/uL    RBC  4.04  3.87 - 5.11 MIL/uL  Hemoglobin  11.1 (*)  12.0 - 15.0 g/dL    HCT  34.1 (*)  36.0 - 46.0 %    MCV  84.4  78.0 - 100.0 fL    MCH  27.5  26.0 - 34.0 pg    MCHC  32.6  30.0 - 36.0 g/dL    RDW  14.5  11.5 - 15.5 %    Platelets  348  150 - 400 K/uL   GLUCOSE, CAPILLARY Status: Abnormal    Collection Time    07/14/12 7:49 AM   Result  Value  Range    Glucose-Capillary  148 (*)  70 - 99 mg/dL   GLUCOSE, CAPILLARY Status: Abnormal    Collection Time    07/14/12 11:41 AM   Result  Value  Range    Glucose-Capillary  133 (*)  70 - 99 mg/dL   GLUCOSE, CAPILLARY Status: Abnormal    Collection Time    07/14/12 5:18 PM   Result  Value  Range    Glucose-Capillary  144 (*)  70 - 99 mg/dL   GLUCOSE, CAPILLARY Status: Abnormal    Collection Time    07/14/12 9:27 PM   Result  Value  Range    Glucose-Capillary  162 (*)  70 - 99 mg/dL    Comment 1  Documented in Chart     Comment 2  Notify RN    GLUCOSE, CAPILLARY Status: Abnormal    Collection Time    07/15/12 7:35 AM   Result  Value  Range     Glucose-Capillary  147 (*)  70 - 99 mg/dL    Comment 1  Documented in Chart     Comment 2  Notify RN     No results found.  Post Admission Physician Evaluation:  1. Functional deficits secondary to Lumbar spinal stenosis with neurogenic claudication 2. Patient is admitted to receive collaborative, interdisciplinary care between the physiatrist, rehab nursing staff, and therapy team. 3. Patient's level of medical complexity and substantial therapy needs in context of that medical necessity cannot be provided at a lesser intensity of care such as a SNF. 4. Patient has experienced substantial functional loss from his/her baseline which was documented above under the "Functional History" and "Functional Status" headings. Judging by the patient's diagnosis, physical exam, and functional history, the patient has potential for functional progress which will result in measurable gains while on inpatient rehab. These gains will be of substantial and practical use upon discharge in facilitating mobility and self-care at the household level. 5. Physiatrist will provide 24 hour management of medical needs as well as oversight of the therapy plan/treatment and provide guidance as appropriate regarding the interaction of the two. 6. 24 hour rehab nursing will assist with bladder management, bowel management, safety, skin/wound care, disease management, medication administration, pain management and patient education and help integrate therapy concepts, techniques,education, etc. 7. PT will assess and treat for/with: Pre-gait training, gait training, neuromuscular reeducation, endurance, safety, equipment, Goals are modified independent for all 8. OT will assess and treat for/with: ADLs, cognitive perceptual skills, safety, endurance, equipment, neuromuscular reeducation, Goals are modified independent for all ADLs 9. SLP will assess and treat for/with: Not applicable. 10. Case Management and Social Worker will  assess and treat for psychological issues and discharge planning. 11. Team conference will be held weekly to assess progress toward goals and to determine barriers to discharge. 12. Patient will receive at least 3 hours of therapy per day at least 5 days per  week. 13. ELOS: 7-10days Potential for functional improvement is excellent Medical Problem List and Plan:  1. DVT Prophylaxis/Anticoagulation: Pharmaceutical: Lovenox  2. Pain Management: Continue neurontin for Severe headaches and left eye pain due to facial nerve irritation. Will change morphine to po route. Use prn severe pain. Symptoms already improving with steroids.  3. Depression: Continue cymbalta as has been feeling helpless and depressed for a period of time now. She is feeling better about her situation and hopes that CIR will get her back to premorbid level of activity (prior to gamma knife procedure)  4. Neuropsych: This patient is capable of making decisions on his/her own behalf.  5. DM type 2: will continue to monitor BS with ac/hs cbg checks. Anticipate BS getting higher due to steriods. Was started on levemir yesterday. Will continue while she's on higher dose steroids. Continue home medications of trajenta daily.  6. RA: on plaquenil daily.  7. Hypothyroid: continue to supplement.  8. HTN: monitor with bid checks. Continue toprol XL and lasix. Not on benicar or aldactone at this time but may need to resume as intermittent elevations noted. Will monitor for now.

## 2012-07-16 ENCOUNTER — Inpatient Hospital Stay (HOSPITAL_COMMUNITY): Payer: Medicare Other

## 2012-07-16 ENCOUNTER — Inpatient Hospital Stay (HOSPITAL_COMMUNITY): Payer: Medicare Other | Admitting: Occupational Therapy

## 2012-07-16 DIAGNOSIS — M48061 Spinal stenosis, lumbar region without neurogenic claudication: Secondary | ICD-10-CM

## 2012-07-16 LAB — COMPREHENSIVE METABOLIC PANEL
ALT: 16 U/L (ref 0–35)
BUN: 39 mg/dL — ABNORMAL HIGH (ref 6–23)
Calcium: 9.3 mg/dL (ref 8.4–10.5)
Creatinine, Ser: 1.27 mg/dL — ABNORMAL HIGH (ref 0.50–1.10)
GFR calc Af Amer: 48 mL/min — ABNORMAL LOW (ref >90)
GFR calc non Af Amer: 42 mL/min — ABNORMAL LOW (ref >90)
Glucose, Bld: 165 mg/dL — ABNORMAL HIGH (ref 70–99)
Sodium: 136 mEq/L (ref 135–145)
Total Protein: 8.1 g/dL (ref 6.0–8.3)

## 2012-07-16 LAB — CBC WITH DIFFERENTIAL/PLATELET
Eosinophils Absolute: 0 10*3/uL (ref 0.0–0.7)
Eosinophils Relative: 0 % (ref 0–5)
HCT: 37 % (ref 36.0–46.0)
Lymphs Abs: 1.6 10*3/uL (ref 0.7–4.0)
MCH: 27.5 pg (ref 26.0–34.0)
MCV: 83.5 fL (ref 78.0–100.0)
Monocytes Absolute: 0.7 10*3/uL (ref 0.1–1.0)
Platelets: 418 10*3/uL — ABNORMAL HIGH (ref 150–400)
RBC: 4.43 MIL/uL (ref 3.87–5.11)

## 2012-07-16 LAB — GLUCOSE, CAPILLARY: Glucose-Capillary: 104 mg/dL — ABNORMAL HIGH (ref 70–99)

## 2012-07-16 NOTE — Evaluation (Signed)
Occupational Therapy Assessment and Plan & Session Notes  Patient Details  Name: EVANGELIA WHITAKER MRN: [id number] Date of Birth: November 21, 1940  OT Diagnosis: acute pain, lumbago (low back pain) and muscle weakness (generalized) Rehab Potential: Rehab Potential: Good ELOS: 10-12 days   Today's Date: 07/16/2012  ASSESSMENT AND PLAN  Problem List:  Patient Active Problem List  Diagnosis  . High cholesterol  . Thyroid disease  . Hypertension  . Brain mass  . Chest pain  . Rheumatoid arthritis  . Osteoarthritis  . 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  . Spinal stenosis of lumbar region with radiculopathy  . Inability to walk  . Acute lumbar back pain  . Asymptomatic bacteriuria    Past Medical History:  Past Medical History  Diagnosis Date  . Diabetes mellitus   . Hypertension   . Thyroid disease   . Arthritis     endstage changes bilateral knees/bilateral ankles.   . High cholesterol   . 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 02/17/2012    Continue diplopia, left eye pain and left headaches.    . Depression, reactive   . RA (rheumatoid arthritis)     has been off methotreaxte since 10/13.  . Contusion of left knee     due to fall 1/14.   Past Surgical History:  Past Surgical History  Procedure Laterality Date  . Abdominal hysterectomy    . Ovary surgery    . Shoulder surgery    . Brain surgery      Gamma knife 10/13. Needs repeat spring  '14    Clinical Impression: Malai E Pfahler is a 72 y.o. female with history of RA, DM, with diabetic neuropathy, meningioma resection; who was admitted on 07/12/12 with shooting pain in BLE with RLE instability and inability to walk. Patient with reports of fall three weeks PTA with soreness left hip and left knee. MRI lumbar spine revealed moderate to marked spinal stenosis L4-5, moderate stenosis L3-4 and mild to moderate stenosis L2-3. She was evaluated by Dr. Rita Ohara who recommended  PT/OT for proximal LE weakness as patient with multiple cormorbidities. Was started on IV steroids which has helped with pain management and overall mobility. Therapy initiated and patient noted to be limited by LE weakness and pain. Therapy team recommended CIR  Worse pain is in the left lower extremity from the back to the ankle area  Overall feels better since admission but feels weak. Patient transferred to CIR on 07/15/2012 .    Patient currently requires min-total assist with basic self-care skills and IADL secondary to muscle weakness and muscle joint tightness and decreased standing balance, decreased postural control and decreased balance strategies.  Prior to hospitalization, patient could complete ADLs and basic IADLs at a modified independent level, using cane for assistance.  Patient will benefit from skilled intervention to increase independence with basic self-care skills prior to discharge home with care partner.  Anticipate patient will require intermittent supervision and no further OT follow recommended.  OT - End of Session Activity Tolerance: Tolerates 10 - 20 min activity with multiple rests Endurance Deficit: Yes OT Assessment Rehab Potential: Good Barriers to Discharge: None (none known at this time) OT Plan OT Intensity: Minimum of 1-2 x/day, 45 to 90 minutes OT Frequency: 5 out of 7 days OT Duration/Estimated Length of Stay: 10-12 days OT Treatment/Interventions: Medical illustrator training;Community reintegration;Discharge planning;DME/adaptive equipment instruction;Functional mobility training;Neuromuscular re-education;Pain management;Patient/family education;Psychosocial support;Self Care/advanced ADL retraining;Skin care/wound managment;Splinting/orthotics;Therapeutic Activities;Therapeutic Exercise;UE/LE Strength taining/ROM;UE/LE Coordination  activities;Wheelchair propulsion/positioning OT Recommendation Patient destination: Home Follow Up Recommendations:  None Equipment Details: patient has needed OT equipment  Precautions/Restrictions  Precautions Precautions: Fall Restrictions Weight Bearing Restrictions: No  General Chart Reviewed: Yes Family/Caregiver Present: No  Pain Pain Assessment Pain Assessment: 0-10 Pain Score:   3 Pain Type: Chronic pain Pain Location: Knee Pain Orientation: Right Pain Descriptors: Aching Pain Intervention(s): RN made aware  Home Living/Prior Functioning Home Living Lives With: Spouse Available Help at Discharge: Family;Available 24 hours/day Type of Home: House Home Access: Stairs to enter [employer] of Steps: small threshold step Entrance Stairs-Rails: None Home Layout: Two level;Able to live on main level with bedroom/bathroom Bathroom Shower/Tub: Walk-in shower;Curtain Bathroom Toilet: Handicapped height Bathroom Accessibility: Yes Home Adaptive Equipment: Bedside commode/3-in-1;Straight cane;Shower chair with back;Walker - rolling IADL History Homemaking Responsibilities: Yes Meal Prep Responsibility: Primary Laundry Responsibility: No Cleaning Responsibility: No Bill Paying/Finance Responsibility: Secondary Shopping Responsibility: No Child Care Responsibility: No Current License: Yes Mode of Transportation: Car Occupation: Retired Type of Occupation: Marine scientist at [employer] Prior Function Driving: No (not since surgery) Vocation: Retired  ADL - See FIM  Vision/Perception  Vision - History Baseline Vision: Wears glasses all the time Visual History: Brain injury Patient Visual Report: Diplopia (diploia has improved since recent brain surgery) Perception Perception: Within Functional Limits Praxis Praxis: Intact   Cognition Overall Cognitive Status: Appears within functional limits for tasks assessed Arousal/Alertness: Awake/alert Orientation Level: Oriented X4 Memory: Appears intact Awareness: Appears intact Problem Solving: Appears intact Safety/Judgment:  Appears intact Comments: pt verbalized having some word finding issues and slower processing at times since her brain surgery  Sensation Sensation Light Touch: Appears Intact (reports diminished sensation intermittently BLE) Additional Comments: patient with complaints of numbness, stiffness, and weakness throughout bilateral hands Coordination Gross Motor Movements are Fluid and Coordinated: Yes (decreased strength) Fine Motor Movements are Fluid and Coordinated: Yes (decreased strength)  Motor  Motor Motor: Other (comment) (weakness in RLE)  Trunk/Postural Assessment  Cervical Assessment Cervical Assessment: Within Functional Limits Thoracic Assessment Thoracic Assessment: Exceptions to [facility] (slightly flexed posture seated EOB and standing) Lumbar Assessment Lumbar Assessment: Exceptions to WFL (stiffness; decreased mobilit)   Balance Static Sitting Balance Static Sitting - Level of Assistance: 6: Modified independent (Device/Increase time) Dynamic Sitting Balance Dynamic Sitting - Level of Assistance: 5: Stand by assistance Static Standing Balance Static Standing - Level of Assistance: 4: Min assist Dynamic Standing Balance Dynamic Standing - Level of Assistance: 4: Min assist  Extremity/Trunk Assessment RUE Assessment RUE Assessment: Exceptions to [facility] RUE AROM (degrees) RUE Overall AROM Comments: Patient's external rotation is decreased, making it difficult for functional tasks such as washing hair, brushing hair, and donning of shirt RUE Strength RUE Overall Strength Comments: overall strength =4/5 LUE Assessment LUE Assessment: Exceptions to WFL LUE AROM (degrees) LUE Overall AROM Comments: Patients AROM is decreased, especially for external rotation, making it difficult for functional tasks such as washing hair, brushing hair, and donning shirt LUE Strength LUE Overall Strength Comments: overall strength =3+/5  FIM:  FIM - Eating Eating Activity: 7: Complete  independence:no helper FIM - Grooming Grooming Steps: Wash, rinse, dry face;Wash, rinse, dry hands;Oral care, brush teeth, clean dentures;Shave or apply make-up Grooming: 5: Set-up assist to obtain items (needs assistance with hair care) FIM - Bathing Bathing Steps Patient Completed: Chest;Right Arm;Left Arm;Abdomen;Front perineal area;Right upper leg;Left upper leg Bathing: 3: Mod-Patient completes 5-7 1f10 parts or 50-74% FIM - Upper Body Dressing/Undressing Upper body dressing/undressing steps patient completed: Thread/unthread right bra  strap;Thread/unthread left bra strap;Hook/unhook bra;Thread/unthread right sleeve of pullover shirt/dresss;Put head through opening of pull over shirt/dress;Thread/unthread left sleeve of pullover shirt/dress;Pull shirt over trunk Upper body dressing/undressing: 5: Set-up assist to: Obtain clothing/put away FIM - Lower Body Dressing/Undressing Lower body dressing/undressing steps patient completed: Thread/unthread right underwear leg;Thread/unthread left underwear leg;Thread/unthread right pants leg;Thread/unthread left pants leg;Pull pants up/down Lower body dressing/undressing: 2: Max-Patient completed 25-49% of tasks FIM - Toileting Toileting: 0: Activity did not occur FIM - Bed/Chair Transfer Bed/Chair Transfer: 4: Supine > Sit: Min A (steadying Pt. > 75%/lift 1 leg);5: Sit > Supine: Supervision (verbal cues/safety issues);4: Bed > Chair or W/C: Min A (steadying Pt. > 75%);3: Chair or W/C > Bed: Mod A (lift or lower assist) FIM - Air cabin crew Transfers: 0-Activity did not occur FIM - Systems developer Devices: Tub transfer bench;Grab bars;Walk in shower;Walker Tub/shower Transfers: 4-Into Tub/Shower: Min A (steadying Pt. > 75%/lift 1 leg);4-Out of Tub/Shower: Min A (steadying Pt. > 75%/lift 1 leg)   Refer to Care Plan for Long Term Goals  Recommendations for other services: None  Discharge Criteria: Patient will  be discharged from OT if patient refuses treatment 3 consecutive times without medical reason, if treatment goals not met, if there is a change in medical status, if patient makes no progress towards goals or if patient is discharged from hospital.  The above assessment, treatment plan, treatment alternatives and goals were discussed and mutually agreed upon: by patient  ------------------------------------------------------------------------------------------------------------  SESSION NOTES  Session #1 0930-1030 - 60 Minutes Individual Therapy Patient with 3/10 pain in right knee, no medication needed at this time Initial 1:1 occupational therapy evaluation completed. Focused skilled intervention on grooming tasks seated at sink in w/c, functional ambulation/mobility using rolling walker throughout room, shower stall transfer on/off tub transfer bench, UB/LB bathing at shower level, UB/LB dressing in sit<>stand position, dynamic standing balance/tolerance/endurance, and overall activity tolerance/endurance. Left patient seated in w/c at end of session to maneuver around room prn.  Session #2 [id number] - 64 Minutes Individual Therapy  Patient with complaints of pain in right knee, no rate given; pain medication recently administered per patient Patient found seated in w/c. Patient propelled self up hallway, ~halfway to gym. Therapist propelled patient -> ADL apartment for functional mobility/ambulation, bed transfer, furniture transfer, and shower stall transfer on/off shower seat (like patient has at home). Focused skilled intervention on body mechanics during sit<>stands to increase independence, functional mobility with rolling walker, safety & effectiveness of shower stall transfers, and overall activity tolerance/endurance. Patient with complaints of being out-of-sorts, "miscombobulated". Therapist propelled patient back to room and patient teary-eyed. Patient stated she didn't need anything,  just needed some time to re-group. Left patient seated in w/c with call bell & phone within reach.   Natalyah Cummiskey 07/16/2012, 10:50 AM

## 2012-07-16 NOTE — Progress Notes (Addendum)
Patient ID: [name], female   DOB: [birth year], 72 y.o.   MRN: [id number] Subjective/Complaints: 72 y.o. female with history of RA, DM, with diabetic neuropathy, meningioma resection; who was admitted on 07/12/12 with shooting pain in BLE with RLE instability and inability to walk. Patient with reports of fall three weeks PTA with soreness left hip and left knee. MRI lumbar spine revealed moderate to marked spinal stenosis L4-5, moderate stenosis L3-4 and mild to moderate stenosis L2-3. She was evaluated by Dr. Rita Ohara who recommended PT/OT for proximal LE weakness as patient with multiple cormorbidities. Was started on IV steroids which has helped with pain management and overall mobility  Review of Systems  Musculoskeletal: Positive for myalgias and joint pain.  Neurological: Positive for sensory change and focal weakness.  All other systems reviewed and are negative.    Objective: Vital Signs: Blood pressure 150/90, pulse 81, temperature 97.8 F (36.6 C), temperature source Oral, resp. rate 18, SpO2 92.00%. No results found. Results for orders placed during the hospital encounter of 07/15/12 (from the past 72 hour(s))  GLUCOSE, CAPILLARY     Status: Abnormal   Collection Time    07/15/12 12:02 PM      Result Value Range   Glucose-Capillary 128 (*) 70 - 99 mg/dL   Comment 1 Notify RN    GLUCOSE, CAPILLARY     Status: Abnormal   Collection Time    07/15/12  4:54 PM      Result Value Range   Glucose-Capillary 127 (*) 70 - 99 mg/dL   Comment 1 Notify RN    GLUCOSE, CAPILLARY     Status: Abnormal   Collection Time    07/15/12  8:58 PM      Result Value Range   Glucose-Capillary 121 (*) 70 - 99 mg/dL   Comment 1 Notify RN    GLUCOSE, CAPILLARY     Status: Abnormal   Collection Time    07/16/12  7:13 AM      Result Value Range   Glucose-Capillary 110 (*) 70 - 99 mg/dL   Comment 1 Notify RN       HEENT: normal Cardio: RRR and no murmur Resp: CTA B/L and No Labored  breathing GI: BS positive and obese Extremity:  Pulses positive and No Edema Skin:   Intact Neuro: Alert/Oriented, Cranial Nerve II-XII normal, Abnormal Sensory parasthesia in Legs, Abnormal Motor 4/5 in Bilateral quads TA and Gastroc, 3-/5 HF 5/5 in UEs and Tone:  Within Normal Limits Musc/Skel:  Other No MCP or PIP tenderness no RA deformities Gen NAD   Assessment/Plan: 1. Functional deficits secondary to Lumbar spinal stenosis with neurogenic claudication which require 3+ hours per day of interdisciplinary therapy in a comprehensive inpatient rehab setting. Physiatrist is providing close team supervision and 24 hour management of active medical problems listed below. Physiatrist and rehab team continue to assess barriers to discharge/monitor patient progress toward functional and medical goals. FIM:                   Comprehension Comprehension Mode: Auditory Comprehension: 6-Follows complex conversation/direction: With extra time/assistive device  Expression Expression Mode: Verbal Expression: 6-Expresses complex ideas: With extra time/assistive device  Social Interaction Social Interaction: 6-Interacts appropriately with others with medication or extra time (anti-anxiety, antidepressant).  Problem Solving Problem Solving: 5-Solves basic 90% of the time/requires cueing < 10% of the time  Memory Memory: 6-More than reasonable amt of time  Medical Problem List and Plan:  1. DVT Prophylaxis/Anticoagulation:  Pharmaceutical: Lovenox  2. Pain Management: Continue neurontin for Severe headaches and left eye pain due to facial nerve irritation. Will change morphine to po route. Use prn severe pain. Symptoms already improving with steroids. Max dose gabapentin, cymbalta 6m 3. Depression: Continue cymbalta as has been feeling helpless and depressed for a period of time now. She is feeling better about her situation and hopes that CIR will get her back to premorbid level of  activity (prior to gamma knife procedure)  4. Neuropsych: This patient is capable of making decisions on his/her own behalf.  5. DM type 2: will continue to monitor BS with ac/hs cbg checks. Anticipate BS getting higher due to steriods. Was started on levemir yesterday. Will continue while she's on higher dose steroids. Continue home medications of trajenta daily.  6. RA: on plaquenil daily.  7. Hypothyroid: continue to supplement.  8. HTN: monitor with bid checks. Continue toprol XL and lasix. Not on benicar or aldactone at this time but may need to resume as intermittent elevations noted. Will monitor for now.        LOS (Days) 1 A FACE TO FACE EVALUATION WAS PERFORMED  [name] E 07/16/2012, 7:52 AM

## 2012-07-16 NOTE — Evaluation (Signed)
Physical Therapy Assessment and Plan  Patient Details  Name: [name] MRN: [id number] Date of Birth: March 09, 1941  PT Diagnosis: Abnormality of gait, Difficulty walking, Impaired sensation, Low back pain, Muscle weakness and Pain in LE's Rehab Potential: Good ELOS: 7-10 days   Today's Date: 07/16/2012 Time: 9379-0240 Time Calculation (min): 60 min  Problem List:  Patient Active Problem List  Diagnosis  . High cholesterol  . Thyroid disease  . Hypertension  . Brain mass  . Chest pain  . Rheumatoid arthritis  . Osteoarthritis  . 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  . Spinal stenosis of lumbar region with radiculopathy  . Inability to walk  . Acute lumbar back pain  . Asymptomatic bacteriuria    Past Medical History:  Past Medical History  Diagnosis Date  . Diabetes mellitus   . Hypertension   . Thyroid disease   . Arthritis     endstage changes bilateral knees/bilateral ankles.   . High cholesterol   . 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 02/17/2012    Continue diplopia, left eye pain and left headaches.    . Depression, reactive   . RA (rheumatoid arthritis)     has been off methotreaxte since 10/13.  . Contusion of left knee     due to fall 1/14.   Past Surgical History:  Past Surgical History  Procedure Laterality Date  . Abdominal hysterectomy    . Ovary surgery    . Shoulder surgery    . Brain surgery      Gamma knife 10/13. Needs repeat spring  '14    Assessment & Plan Clinical Impression: Patient is a 72 y.o. year old female with recent admission to the hospital with history of RA, DM, with diabetic neuropathy, meningioma resection; who was admitted on 07/12/12 with shooting pain in BLE with RLE instability and inability to walk. Patient with reports of fall three weeks PTA with soreness left hip and left knee. MRI lumbar spine revealed moderate to marked spinal stenosis L4-5, moderate stenosis L3-4 and  mild to moderate stenosis L2-3. She was evaluated by Dr. Rita Ohara who recommended PT/OT for proximal LE weakness as patient with multiple cormorbidities. Was started on IV steroids which has helped with pain management and overall mobility. Therapy initiated and patient noted to be limited by LE weakness and pain.  Patient transferred to CIR on 07/15/2012 .   Patient currently requires min assist with mobility secondary to muscle weakness and muscle joint tightness, decreased cardiorespiratoy endurance, unbalanced muscle activation and decreased standing balance and decreased balance strategies.  Prior to hospitalization, patient was supervision with mobility (homebound since October) and lived with Spouse (daughter will be staying with her for a little while) in a House.  Home access is small threshold step to enter.  Patient will benefit from skilled PT intervention to maximize safe functional mobility, minimize fall risk and decrease caregiver burden for planned discharge home with 24 hour supervision.  Anticipate patient will benefit from follow up [place] at discharge.  PT Assessment Rehab Potential: Good Barriers to Discharge: None PT Plan PT Intensity: Minimum of 1-2 x/day ,45 to 90 minutes PT Frequency: 5 out of 7 days PT Duration Estimated Length of Stay: 7-10 days PT Treatment/Interventions: Ambulation/gait training;Balance/vestibular training;Community reintegration;Discharge planning;Disease management/prevention;DME/adaptive equipment instruction;Functional mobility training;Neuromuscular re-education;Pain management;Patient/family education;Functional electrical stimulation;Psychosocial support;Skin care/wound management;Splinting/orthotics;Stair training;Therapeutic Activities;Therapeutic Exercise;UE/LE Strength taining/ROM;UE/LE Coordination activities;Wheelchair propulsion/positioning PT Recommendation Follow Up Recommendations: Home health PT Patient destination: Home Equipment  Recommended: None recommended by PT (already has RW and cane)  Skilled Therapeutic Intervention Treatment initiated with sit to stand techniques, education on bed mobility technique and positioning using pillows, stair training in preparation for home entry, and gait with use of RW (cues for posture, slow cadence noted, and reports weakness in RLE (no episode of knee buckling during evaluation)).   PT Evaluation Precautions/Restrictions Precautions Precautions: Fall Restrictions Weight Bearing Restrictions: No Pain  Premedicated - pain in LE's and head/eye Home Living/Prior Functioning Home Living Lives With: Spouse (daughter will be staying with her for a little while) Available Help at Discharge: Family;Available 24 hours/day Type of Home: House Home Access: Stairs to enter [employer] of Steps: small threshold step Entrance Stairs-Rails: None Home Layout: Two level;Able to live on main level with bedroom/bathroom Home Adaptive Equipment: Bedside commode/3-in-1;Straight cane;Shower chair with back;Walker - rolling Vision/Perception  Perception Perception: Within Functional Limits Praxis Praxis: Intact  Pt reports having had double vision, but it is improving. Pt also wears glasses. Cognition Overall Cognitive Status: Appears within functional limits for tasks assessed Safety/Judgment: Appears intact Comments: pt verbalized having some word finding issues and slower processing at times since her brain surgery Sensation Sensation Light Touch: Appears Intact (reports diminished sensation intermittently BLE) Coordination Gross Motor Movements are Fluid and Coordinated: Yes Motor  Motor Motor: Other (comment) (weakness in RLE)  Locomotion  Ambulation Ambulation/Gait Assistance: 4: Min assist  Trunk/Postural Assessment  Cervical Assessment Cervical Assessment: Within Functional Limits Thoracic Assessment Thoracic Assessment: Exceptions to [facility] (slightly flexed  posture seated EOB and standing) Lumbar Assessment Lumbar Assessment: Exceptions to WFL (stiffness; decreased mobilit)  Balance Static Sitting Balance Static Sitting - Level of Assistance: 6: Modified independent (Device/Increase time) Dynamic Sitting Balance Dynamic Sitting - Level of Assistance: 5: Stand by assistance Static Standing Balance Static Standing - Level of Assistance: 4: Min assist Dynamic Standing Balance Dynamic Standing - Level of Assistance: 4: Min assist Extremity Assessment  RLE Assessment RLE Assessment: Exceptions to [facility] (grossly 3+/5 with MMT; decreased muscular endurance) LLE Assessment LLE Assessment: Exceptions to [facility] (grossly 3+ to 4-/5; decreased muscular endurance)  FIM:  FIM - Bed/Chair Transfer Bed/Chair Transfer: 4: Supine > Sit: Min A (steadying Pt. > 75%/lift 1 leg);5: Sit > Supine: Supervision (verbal cues/safety issues);4: Bed > Chair or W/C: Min A (steadying Pt. > 75%);3: Chair or W/C > Bed: Mod A (lift or lower assist) FIM - Locomotion: Wheelchair Locomotion: Wheelchair: 1: Total Assistance/staff pushes wheelchair (Pt<25%) (gait to be primary means of mobility) FIM - Locomotion: Ambulation Ambulation/Gait Assistance: 4: Min assist Locomotion: Ambulation: 1: Travels less than 50 ft with minimal assistance (Pt.>75%) FIM - Locomotion: Stairs Locomotion: Scientist, physiological: Hand rail - 2 Locomotion: Stairs: 1: Up and Down < 4 stairs with minimal assistance (Pt.>75%) (4" step)   Refer to Care Plan for Long Term Goals  Recommendations for other services: None  Discharge Criteria: Patient will be discharged from PT if patient refuses treatment 3 consecutive times without medical reason, if treatment goals not met, if there is a change in medical status, if patient makes no progress towards goals or if patient is discharged from hospital.  The above assessment, treatment plan, treatment alternatives and goals were discussed and mutually agreed  upon: by patient  Allayne Gitelman 07/16/2012, 8:43 AM

## 2012-07-16 NOTE — Progress Notes (Signed)
Patient information reviewed and entered into eRehab system by Diesel Lina, RN, CRRN, PPS Coordinator.  Information including medical coding and functional independence measure will be reviewed and updated through discharge.     Per nursing patient was given "Data Collection Information Summary for Patients in Inpatient Rehabilitation Facilities with attached "Privacy Act Statement-Health Care Records" upon admission.  

## 2012-07-16 NOTE — Progress Notes (Signed)
Social Work  Social Work Assessment and Plan  Patient Details  Name: [name] MRN: [id number] Date of Birth: [birth year]  Today's Date: 07/16/2012  Problem List:  Patient Active Problem List  Diagnosis  . High cholesterol  . Thyroid disease  . Hypertension  . Brain mass  . Chest pain  . Rheumatoid arthritis  . Osteoarthritis  . 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  . Spinal stenosis of lumbar region with radiculopathy  . Inability to walk  . Acute lumbar back pain  . Asymptomatic bacteriuria   Past Medical History:  Past Medical History  Diagnosis Date  . Diabetes mellitus   . Hypertension   . Thyroid disease   . Arthritis     endstage changes bilateral knees/bilateral ankles.   . High cholesterol   . 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 02/17/2012    Continue diplopia, left eye pain and left headaches.    . Depression, reactive   . RA (rheumatoid arthritis)     has been off methotreaxte since 10/13.  . Contusion of left knee     due to fall 1/14.   Past Surgical History:  Past Surgical History  Procedure Laterality Date  . Abdominal hysterectomy    . Ovary surgery    . Shoulder surgery    . Brain surgery      Gamma knife 10/13. Needs repeat spring  '14   Social History:  reports that she has never smoked. She does not have any smokeless tobacco history on file. She reports that she does not drink alcohol or use illicit drugs.  Family / Support Systems Marital Status: Married How Long?: 3 yrs. (pt's second marriage) Patient Roles: Spouse;Parent (Has 6 children.) Spouse/Significant Other: spouse, Marlow Hendrie @ [phone] or [phone]) [phone] Children: Pt has 6 adult children and all are living locally.  Two contacts are daughter, Fonnie Jarvis @ [phone] and son, Rosita Fire @ [phone] Anticipated Caregiver: Dtr [name] and pt's husband Ability/Limitations of Caregiver: Dtr has taken leave from  work. Caregiver Availability: 24/7 Family Dynamics: Pt reports good relationship between her husband and all her children.  Children "all want to be the one in charge", however, they have split taking time off to provide her with assist since her meningioma finding/ treatment in Oct. 2013  Social History Preferred language: [place] Religion: Apostolic Cultural Background: NA Education: college degree Read: Yes Write: Yes Employment Status: Retired Date Retired/Disabled/Unemployed: 2 years ([place] with [employer]) Age Retired: 16 Freight forwarder Issues: none Guardian/Conservator: none   Abuse/Neglect Physical Abuse: Denies Verbal Abuse: Denies Sexual Abuse: Denies Exploitation of patient/patient's resources: Denies Self-Neglect: Denies  Emotional Status Pt's affect, behavior adn adjustment status: Pt very pleasant, talkative and "thrilled" that she got her first "full-nights sleep last night since she has been at hospital".  Very pleased with gains she is making with mobility and decreasing pain issues.  Optimistic about her ability to reach mod i goals by d/c.  Denies any significant emotional distress - will monitor. Recent Psychosocial Issues: dianosed with meningioma Oct. 2013 and underwent gamma knife procedure - notes family and friends have been extremely supportive through this Pyschiatric History: none Substance Abuse History: none  Patient / Family Perceptions, Expectations & Goals Pt/Family understanding of illness & functional limitations: Pt and family with good understanding of her lumbar stenosis and reasons she is not considered a surgical candidate. Premorbid pt/family roles/activities: Pt has been fairly limited  with mobility and community level activities since gamma knife procedure - children have assisted as needed. Anticipated changes in roles/activities/participation: little change anticipated except that her mobility may be much improved from  level just PTA Pt/family expectations/goals: "I want to be able to do things and get out of the house!"  [employer]: None Premorbid Home Care/DME Agencies: Other (Comment) (recently received rolling walker from [facility]) Transportation available at discharge: yes  Discharge Planning Living Arrangements: Spouse/significant other;Children Support Systems: Children;Spouse/significant other Type of Residence: Private residence Insurance Resources: [employer] Financial Resources: [place] Referred: No Living Expenses: Rent Money Management: Spouse Do you have any problems obtaining your medications?: No Home Management: family assists Patient/Family Preliminary Plans: Pt plans to return home with her spouse and daugther to assist Social Work Anticipated Follow Up Needs: HH/OP Expected length of stay: 7-10 days  Clinical Impression Very pleasant, motivated woman here after finding of lumbar stenosis (and recent gamma knife procedure Oct. 2013 for meningioma).  Supportive family.  Team anticipates mod i goals with 7-10 day LOS. No emotional distress noted or reported.  Will monitor and follow for d/c planning and support.  Shelsie Tijerino 07/16/2012, 3:32 PM

## 2012-07-16 NOTE — ED Provider Notes (Signed)
Medical screening examination/treatment/procedure(s) were performed by non-physician practitioner and as supervising physician I was immediately available for consultation/collaboration.   Mirna Mires, MD 07/16/12 1030

## 2012-07-16 NOTE — Progress Notes (Signed)
Physical Therapy Session Note  Patient Details  Name: [name] MRN: [id number] Date of Birth: 03/18/1941  Today's Date: 07/16/2012 Time: 1400-1430 Time Calculation (min): 30 min  Short Term Goals: Week 1:  PT Short Term Goal 1 (Week 1): = LTGs due to LOS  Skilled Therapeutic Interventions/Progress Updates:   Pt initiated discussion of her head feeling "discombobulated" and determined this to mean that pt felt like she was having a hard time processing information, difficulty with memory, and difficulty with expressing herself. She reports she also had an emotional break down earlier and explained that she would find herself crying for no reason in the past before this admission (today was the first time in awhile per pt report). Discussed and educated pt on available resources on our floor including Neuro Psych and Speech therapy to help pt with some of these complaints, and pt expressed desire to meet with Neuro Psych (referral to be filled out). Also may recommend Recreational Therapist evaluation for stress management techniques and leisure activities during admission.  Gait on unit > 120' with steady A/close S using RW for endurance and strengthening.  Therapy Documentation Precautions:  Precautions Precautions: Fall Restrictions Weight Bearing Restrictions: No   Pain: R knee pain - premedicated.   See FIM for current functional status  Therapy/Group: Individual Therapy  Canary Brim [facility] 07/16/2012, 2:35 PM

## 2012-07-17 ENCOUNTER — Inpatient Hospital Stay (HOSPITAL_COMMUNITY): Payer: Medicare Other | Admitting: Occupational Therapy

## 2012-07-17 ENCOUNTER — Inpatient Hospital Stay (HOSPITAL_COMMUNITY): Payer: Medicare Other | Admitting: Physical Therapy

## 2012-07-17 DIAGNOSIS — M48061 Spinal stenosis, lumbar region without neurogenic claudication: Secondary | ICD-10-CM

## 2012-07-17 LAB — GLUCOSE, CAPILLARY
Glucose-Capillary: 92 mg/dL (ref 70–99)
Glucose-Capillary: 95 mg/dL (ref 70–99)
Glucose-Capillary: 109 mg/dL — ABNORMAL HIGH (ref 70–99)

## 2012-07-17 NOTE — Progress Notes (Signed)
Physical Therapy Session Note  Patient Details  Name: MARQUEL SPOTO MRN: [id number] Date of Birth: July 19, 1940  Today's Date: 07/17/2012 Time: 1100-1145 Time Calculation (min): 45 min  Short Term Goals: Week 1:  PT Short Term Goal 1 (Week 1): = LTGs due to LOS  Therapy Documentation Precautions:  Precautions: Fall Restrictions Weight Bearing Restrictions: No Pain: No/denies pain at rest  Gait Training:(15') using RW able to tolerate 3 x 120' before R antero-medial knee pain affecting gait and requesting seated rest break Therapeutic Exercise:(15') B LE's in sitting Therapeutic Activity:(15') Transfer training sit<->stand with multiple attempts to stand form low/regular seating height (~2-3x) but able to perform into RW with Mod-I.     Therapy/Group: Individual Therapy  Tannor Pyon J 07/17/2012, 11:04 AM

## 2012-07-17 NOTE — Progress Notes (Signed)
Occupational Therapy Session Note  Patient Details  Name: SUHEY RADFORD MRN: [id number] Date of Birth: Mar 26, 1941  Today's Date: 07/17/2012 Time: 0940-1040 and  Time Calculation (min): 60 min and   Skilled Therapeutic Interventions/Progress Updates: 1st session:    toileting with distant S,  utilizing RW for functional mobility.  Patient positive and motivated for completing therapy as Independently as possible.   No c/os visual issues during therapy.  When asked how her vision was, she stated, "My left peripheral vision is missing a little sometimes when I am tired.  It came from the brain stuff."  Patient required assist to don/doff socks and stated her husband completed that task at home but was interested in learning to use the sock aide during her rehab stay.  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  2nd session:  Patient able to return demonstration and don socks with distant S.  As well, she completed endurance activities and functional mobility walker level to further increase her independence.     Therapy Documentation Precautions:  Precautions Precautions: Fall Restrictions Weight Bearing Restrictions: No   Pain: Pain Assessment Pain Assessment: No/denies pain   See FIM for current functional status  Therapy/Group: Individual Therapy  Alfredia Ferguson [facility] 07/17/2012, 12:26 PM

## 2012-07-17 NOTE — Progress Notes (Signed)
Patient ID: [name], female   DOB: [birth year], 72 y.o.   MRN: [id number] Patient ID: SARIKA BALDINI, female   DOB: 14-Aug-1940, 72 y.o.   MRN: [id number]  23/50.  72 year old patient who has a history of diabetes complicated by neuropathy and also has a history of prior meningioma resection. Patient admitted hospital with severe lumbar pain associated with bilateral lower extremity pain and inability to walk. Lumbar MRI confirms severe spinal stenosis. Patient has improved with the physical therapy and is comfortable with very little pain. She is ambulatory  with a walker.  BP Readings from Last 3 Encounters:  07/17/12 153/84  07/15/12 131/82  05/14/12 117/66    CBG (last 3)   Recent Labs  07/16/12 1615 07/16/12 2050 07/17/12 0727  GLUCAP 138* 104* 92    General exam. Obese comfortable no complaints ENT negative Chest clear Cardio vascular normal S1-S2 no murmurs Abdomen obese soft nontender Extremities +1 edema  Impr- severe lumbar spinal stenosis Diabetes mellitus well controlled Hypertension stable  Subjective/Complaints: 72 y.o. female with history of RA, DM, with diabetic neuropathy, meningioma resection; who was admitted on 07/12/12 with shooting pain in BLE with RLE instability and inability to walk. Patient with reports of fall three weeks PTA with soreness left hip and left knee. MRI lumbar spine revealed moderate to marked spinal stenosis L4-5, moderate stenosis L3-4 and mild to moderate stenosis L2-3. She was evaluated by Dr. Rita Ohara who recommended PT/OT for proximal LE weakness as patient with multiple cormorbidities. Was started on IV steroids which has helped with pain management and overall mobility  Review of Systems  Musculoskeletal: Positive for myalgias and joint pain.  Neurological: Positive for sensory change and focal weakness.  All other systems reviewed and are negative.    Objective: Vital Signs: Blood pressure 153/84, pulse 78, temperature  97.5 F (36.4 C), temperature source Oral, resp. rate 20, SpO2 95.00%. No results found. Results for orders placed during the hospital encounter of 07/15/12 (from the past 72 hour(s))  GLUCOSE, CAPILLARY     Status: Abnormal   Collection Time    07/15/12 12:02 PM      Result Value Range   Glucose-Capillary 128 (*) 70 - 99 mg/dL   Comment 1 Notify RN    GLUCOSE, CAPILLARY     Status: Abnormal   Collection Time    07/15/12  4:54 PM      Result Value Range   Glucose-Capillary 127 (*) 70 - 99 mg/dL   Comment 1 Notify RN    GLUCOSE, CAPILLARY     Status: Abnormal   Collection Time    07/15/12  8:58 PM      Result Value Range   Glucose-Capillary 121 (*) 70 - 99 mg/dL   Comment 1 Notify RN    GLUCOSE, CAPILLARY     Status: Abnormal   Collection Time    07/16/12  7:13 AM      Result Value Range   Glucose-Capillary 110 (*) 70 - 99 mg/dL   Comment 1 Notify RN    CBC WITH DIFFERENTIAL     Status: Abnormal   Collection Time    07/16/12  8:50 AM      Result Value Range   WBC 11.2 (*) 4.0 - 10.5 K/uL   RBC 4.43  3.87 - 5.11 MIL/uL   Hemoglobin 12.2  12.0 - 15.0 g/dL   HCT 37.0  36.0 - 46.0 %   MCV 83.5  78.0 - 100.0 fL  MCH 27.5  26.0 - 34.0 pg   MCHC 33.0  30.0 - 36.0 g/dL   RDW 14.6  11.5 - 15.5 %   Platelets 418 (*) 150 - 400 K/uL   Neutrophils Relative 80 (*) 43 - 77 %   Neutro Abs 8.9 (*) 1.7 - 7.7 K/uL   Lymphocytes Relative 14  12 - 46 %   Lymphs Abs 1.6  0.7 - 4.0 K/uL   Monocytes Relative 6  3 - 12 %   Monocytes Absolute 0.7  0.1 - 1.0 K/uL   Eosinophils Relative 0  0 - 5 %   Eosinophils Absolute 0.0  0.0 - 0.7 K/uL   Basophils Relative 0  0 - 1 %   Basophils Absolute 0.0  0.0 - 0.1 K/uL  COMPREHENSIVE METABOLIC PANEL     Status: Abnormal   Collection Time    07/16/12  8:50 AM      Result Value Range   Sodium 136  135 - 145 mEq/L   Potassium 4.0  3.5 - 5.1 mEq/L   Chloride 96  96 - 112 mEq/L   CO2 31  19 - 32 mEq/L   Glucose, Bld 165 (*) 70 - 99 mg/dL   BUN 39  (*) 6 - 23 mg/dL   Creatinine, Ser 1.27 (*) 0.50 - 1.10 mg/dL   Calcium 9.3  8.4 - 10.5 mg/dL   Total Protein 8.1  6.0 - 8.3 g/dL   Albumin 2.9 (*) 3.5 - 5.2 g/dL   AST 13  0 - 37 U/L   ALT 16  0 - 35 U/L   Alkaline Phosphatase 57  39 - 117 U/L   Total Bilirubin 0.3  0.3 - 1.2 mg/dL   GFR calc non Af Amer 42 (*) >90 mL/min   GFR calc Af Amer 48 (*) >90 mL/min   Comment:            The eGFR has been calculated     using the CKD EPI equation.     This calculation has not been     validated in all clinical     situations.     eGFR's persistently     <90 mL/min signify     possible Chronic Kidney Disease.  GLUCOSE, CAPILLARY     Status: None   Collection Time    07/16/12 11:31 AM      Result Value Range   Glucose-Capillary 92  70 - 99 mg/dL   Comment 1 Notify RN    GLUCOSE, CAPILLARY     Status: Abnormal   Collection Time    07/16/12  4:15 PM      Result Value Range   Glucose-Capillary 138 (*) 70 - 99 mg/dL   Comment 1 Notify RN    GLUCOSE, CAPILLARY     Status: Abnormal   Collection Time    07/16/12  8:50 PM      Result Value Range   Glucose-Capillary 104 (*) 70 - 99 mg/dL   Comment 1 Notify RN    GLUCOSE, CAPILLARY     Status: None   Collection Time    07/17/12  7:27 AM      Result Value Range   Glucose-Capillary 92  70 - 99 mg/dL   Comment 1 Notify RN       HEENT: normal Cardio: RRR and no murmur Resp: CTA B/L and No Labored breathing GI: BS positive and obese Extremity:  Pulses positive and No Edema Skin:   Intact  Neuro: Alert/Oriented, Cranial Nerve II-XII normal, Abnormal Sensory parasthesia in Legs, Abnormal Motor 4/5 in Bilateral quads TA and Gastroc, 3-/5 HF 5/5 in UEs and Tone:  Within Normal Limits Musc/Skel:  Other No MCP or PIP tenderness no RA deformities Gen NAD   Assessment/Plan: 1. Functional deficits secondary to Lumbar spinal stenosis with neurogenic claudication which require 3+ hours per day of interdisciplinary therapy in a comprehensive  inpatient rehab setting. Physiatrist is providing close team supervision and 24 hour management of active medical problems listed below. Physiatrist and rehab team continue to assess barriers to discharge/monitor patient progress toward functional and medical goals. FIM: FIM - Bathing Bathing Steps Patient Completed: Chest;Right Arm;Left Arm;Abdomen;Front perineal area;Right upper leg;Left upper leg Bathing: 3: Mod-Patient completes 5-7 52f10 parts or 50-74%  FIM - Upper Body Dressing/Undressing Upper body dressing/undressing steps patient completed: Thread/unthread right bra strap;Thread/unthread left bra strap;Hook/unhook bra;Thread/unthread right sleeve of pullover shirt/dresss;Put head through opening of pull over shirt/dress;Thread/unthread left sleeve of pullover shirt/dress;Pull shirt over trunk Upper body dressing/undressing: 5: Set-up assist to: Obtain clothing/put away FIM - Lower Body Dressing/Undressing Lower body dressing/undressing steps patient completed: Thread/unthread right underwear leg;Thread/unthread left underwear leg;Thread/unthread right pants leg;Thread/unthread left pants leg;Pull pants up/down Lower body dressing/undressing: 2: Max-Patient completed 25-49% of tasks  FIM - Toileting Toileting: 0: Activity did not occur  FIM - TAir cabin crewTransfers: 0-Activity did not occur  FIM - BIT sales professionalTransfer: 4: Supine > Sit: Min A (steadying Pt. > 75%/lift 1 leg);5: Sit > Supine: Supervision (verbal cues/safety issues);4: Bed > Chair or W/C: Min A (steadying Pt. > 75%);3: Chair or W/C > Bed: Mod A (lift or lower assist)  FIM - Locomotion: Wheelchair Locomotion: Wheelchair: 1: Total Assistance/staff pushes wheelchair (Pt<25%) (gait to be primary means of mobility) FIM - Locomotion: Ambulation Ambulation/Gait Assistance: 4: Min assist Locomotion: Ambulation: 1: Travels less than 50 ft with minimal assistance  (Pt.>75%)  Comprehension Comprehension Mode: Auditory Comprehension: 7-Follows complex conversation/direction: With no assist  Expression Expression Mode: Verbal Expression: 7-Expresses complex ideas: With no assist  Social Interaction Social Interaction: 6-Interacts appropriately with others with medication or extra time (anti-anxiety, antidepressant). (antidepressant)  Problem Solving Problem Solving: 7-Solves complex problems: Recognizes & self-corrects  Memory Memory: 7-Complete Independence: No helper  Medical Problem List and Plan:  1. DVT Prophylaxis/Anticoagulation: Pharmaceutical: Lovenox  2. Pain Management: Continue neurontin for Severe headaches and left eye pain due to facial nerve irritation. Will change morphine to po route. Use prn severe pain. Symptoms already improving with steroids. Max dose gabapentin, cymbalta 676m3. Depression: Continue cymbalta as has been feeling helpless and depressed for a period of time now. She is feeling better about her situation and hopes that CIR will get her back to premorbid level of activity (prior to gamma knife procedure)  4. Neuropsych: This patient is capable of making decisions on his/her own behalf.  5. DM type 2: will continue to monitor BS with ac/hs cbg checks. Anticipate BS getting higher due to steriods. Was started on levemir yesterday. Will continue while she's on higher dose steroids. Continue home medications of trajenta daily.  6. RA: on plaquenil daily.  7. Hypothyroid: continue to supplement.  8. HTN: monitor with bid checks. Continue toprol XL and lasix. Not on benicar or aldactone at this time but may need to resume as intermittent elevations noted. Will monitor for now.        LOS (Days) 2 A FAGloria Glens ParkVALUATION WAS PERFORMED  KWNyoka Cowden  07/17/2012, 8:53 AM

## 2012-07-17 NOTE — Progress Notes (Signed)
Physical Therapy Session Note  Patient Details  Name: [name] MRN: [id number] Date of Birth: 1940/09/02  Today's Date: 07/17/2012 Time: 0730-0830 Time Calculation (min): 60 min  Short Term Goals: Week 1:  PT Short Term Goal 1 (Week 1): = LTGs due to LOS  Therapy Documentation Precautions:  Precautions: Fall Restrictions Weight Bearing Restrictions: No Pain: Pain Assessment: 0-10 Pain Score:   4 Pain Type: Acute pain Pain Location: Head Pain Orientation: Left Pain Descriptors: Aching Pain Frequency: Occasional Pain Onset: Unable to tell Pain Intervention(s): Medication (See eMAR) Multiple Pain Sites: No  Therapeutic Activity:(30') Transfer training with verbal cues for safety, bed mobility S/Mod-I using bedrail. Transfers sit<->stand with bed and seat surfaces elevated slightly S/Mod-I with multiple attempts (2-3x) before being able to stand upright.  Patient needs a minute of initial standing to                                             allow knee and ankle joints to "settle" before ambulating. Therapeutic Exercise:(15') B LE's in supine with ankle pumps and in sitting/standing Gait Training:(15') 2 x 200' using RW with S/Mod-I, slow and patient noting mild pain and weakness into R knee by end of distance.   Therapy/Group: Individual Therapy  Clearence Ped 07/17/2012, 7:34 AM

## 2012-07-18 ENCOUNTER — Inpatient Hospital Stay (HOSPITAL_COMMUNITY): Payer: Medicare Other | Admitting: Occupational Therapy

## 2012-07-18 LAB — GLUCOSE, CAPILLARY
Glucose-Capillary: 98 mg/dL (ref 70–99)
Glucose-Capillary: 140 mg/dL — ABNORMAL HIGH (ref 70–99)
Glucose-Capillary: 140 mg/dL — ABNORMAL HIGH (ref 70–99)

## 2012-07-18 NOTE — Progress Notes (Signed)
Occupational Therapy Session Note  Patient Details  Name: DULCEY RIEDERER MRN: [id number] Date of Birth: 08-26-1940  Today's Date: 07/18/2012 Time: 1200-1225 and 0450-1050 Time Calculation (min): 25 minn and 60=85 total  Skilled Therapeutic Interventions/Progress Updates: ADL in w/c at sink with focus on LB b/dressing and functional  Mobility to gather items and for toileting.  Patient will benefit from use of  LH shoe horn and sock aide in case husband is not available to help upon d/c.     Therapy Documentation Precautions:  Precautions Precautions: Fall Restrictions Weight Bearing Restrictions: No Pain:denied     See FIM for current functional status  Therapy/Group: Individual Therapy  Alfredia Ferguson [facility] 07/18/2012, 2:39 PM

## 2012-07-18 NOTE — Progress Notes (Signed)
Patient ID: [name], female   DOB: 03-12-1941, 72 y.o.   MRN: [id number] Patient ID: SHALLON YAKLIN, female   DOB: 03/02/1941, 72 y.o.   MRN: [id number] Patient ID: LARAE CAISON, female   DOB: 03-Feb-1941, 72 y.o.   MRN: [id number]  95/73.  72 year old patient who has a history of diabetes complicated by neuropathy and also has a history of prior meningioma resection. Patient admitted hospital with severe lumbar pain associated with bilateral lower extremity pain and inability to walk. Lumbar MRI confirms severe spinal stenosis. Patient has improved with the physical therapy and is comfortable with very little pain. She is ambulatory  with a walker.  Walked  quite a bit yesterday and is quite pleased with her rapid progress  BP Readings from Last 3 Encounters:  07/18/12 133/71  07/15/12 131/82  05/14/12 117/66    CBG (last 3)   Recent Labs  07/17/12 2058 07/18/12 0727 07/18/12 0748  GLUCAP 133* 69* 99    General exam. Obese comfortable no complaints ENT negative Chest clear Cardio vascular normal S1-S2 no murmurs Abdomen obese soft nontender Extremities +1 edema  Impr- severe lumbar spinal stenosis; symptomatically much improved Diabetes mellitus well controlled Hypertension stable  Subjective/Complaints: 72 y.o. female with history of RA, DM, with diabetic neuropathy, meningioma resection; who was admitted on 07/12/12 with shooting pain in BLE with RLE instability and inability to walk. Patient with reports of fall three weeks PTA with soreness left hip and left knee. MRI lumbar spine revealed moderate to marked spinal stenosis L4-5, moderate stenosis L3-4 and mild to moderate stenosis L2-3. She was evaluated by Dr. Rita Ohara who recommended PT/OT for proximal LE weakness as patient with multiple cormorbidities. Was started on IV steroids which has helped with pain management and overall mobility  Review of Systems  Musculoskeletal: Positive for myalgias and joint pain.   Neurological: Positive for sensory change and focal weakness.  All other systems reviewed and are negative.    Objective: Vital Signs: Blood pressure 133/71, pulse 84, temperature 98 F (36.7 C), temperature source Oral, resp. rate 18, SpO2 98.00%. No results found. Results for orders placed during the hospital encounter of 07/15/12 (from the past 72 hour(s))  GLUCOSE, CAPILLARY     Status: Abnormal   Collection Time    07/15/12 12:02 PM      Result Value Range   Glucose-Capillary 128 (*) 70 - 99 mg/dL   Comment 1 Notify RN    GLUCOSE, CAPILLARY     Status: Abnormal   Collection Time    07/15/12  4:54 PM      Result Value Range   Glucose-Capillary 127 (*) 70 - 99 mg/dL   Comment 1 Notify RN    GLUCOSE, CAPILLARY     Status: Abnormal   Collection Time    07/15/12  8:58 PM      Result Value Range   Glucose-Capillary 121 (*) 70 - 99 mg/dL   Comment 1 Notify RN    GLUCOSE, CAPILLARY     Status: Abnormal   Collection Time    07/16/12  7:13 AM      Result Value Range   Glucose-Capillary 110 (*) 70 - 99 mg/dL   Comment 1 Notify RN    CBC WITH DIFFERENTIAL     Status: Abnormal   Collection Time    07/16/12  8:50 AM      Result Value Range   WBC 11.2 (*) 4.0 - 10.5 K/uL   RBC 4.43  3.87 - 5.11 MIL/uL   Hemoglobin 12.2  12.0 - 15.0 g/dL   HCT 37.0  36.0 - 46.0 %   MCV 83.5  78.0 - 100.0 fL   MCH 27.5  26.0 - 34.0 pg   MCHC 33.0  30.0 - 36.0 g/dL   RDW 14.6  11.5 - 15.5 %   Platelets 418 (*) 150 - 400 K/uL   Neutrophils Relative 80 (*) 43 - 77 %   Neutro Abs 8.9 (*) 1.7 - 7.7 K/uL   Lymphocytes Relative 14  12 - 46 %   Lymphs Abs 1.6  0.7 - 4.0 K/uL   Monocytes Relative 6  3 - 12 %   Monocytes Absolute 0.7  0.1 - 1.0 K/uL   Eosinophils Relative 0  0 - 5 %   Eosinophils Absolute 0.0  0.0 - 0.7 K/uL   Basophils Relative 0  0 - 1 %   Basophils Absolute 0.0  0.0 - 0.1 K/uL  COMPREHENSIVE METABOLIC PANEL     Status: Abnormal   Collection Time    07/16/12  8:50 AM       Result Value Range   Sodium 136  135 - 145 mEq/L   Potassium 4.0  3.5 - 5.1 mEq/L   Chloride 96  96 - 112 mEq/L   CO2 31  19 - 32 mEq/L   Glucose, Bld 165 (*) 70 - 99 mg/dL   BUN 39 (*) 6 - 23 mg/dL   Creatinine, Ser 1.27 (*) 0.50 - 1.10 mg/dL   Calcium 9.3  8.4 - 10.5 mg/dL   Total Protein 8.1  6.0 - 8.3 g/dL   Albumin 2.9 (*) 3.5 - 5.2 g/dL   AST 13  0 - 37 U/L   ALT 16  0 - 35 U/L   Alkaline Phosphatase 57  39 - 117 U/L   Total Bilirubin 0.3  0.3 - 1.2 mg/dL   GFR calc non Af Amer 42 (*) >90 mL/min   GFR calc Af Amer 48 (*) >90 mL/min   Comment:            The eGFR has been calculated     using the CKD EPI equation.     This calculation has not been     validated in all clinical     situations.     eGFR's persistently     <90 mL/min signify     possible Chronic Kidney Disease.  GLUCOSE, CAPILLARY     Status: None   Collection Time    07/16/12 11:31 AM      Result Value Range   Glucose-Capillary 92  70 - 99 mg/dL   Comment 1 Notify RN    GLUCOSE, CAPILLARY     Status: Abnormal   Collection Time    07/16/12  4:15 PM      Result Value Range   Glucose-Capillary 138 (*) 70 - 99 mg/dL   Comment 1 Notify RN    GLUCOSE, CAPILLARY     Status: Abnormal   Collection Time    07/16/12  8:50 PM      Result Value Range   Glucose-Capillary 104 (*) 70 - 99 mg/dL   Comment 1 Notify RN    GLUCOSE, CAPILLARY     Status: None   Collection Time    07/17/12  7:27 AM      Result Value Range   Glucose-Capillary 92  70 - 99 mg/dL   Comment 1 Notify RN  GLUCOSE, CAPILLARY     Status: None   Collection Time    07/17/12 11:33 AM      Result Value Range   Glucose-Capillary 95  70 - 99 mg/dL   Comment 1 Notify RN    GLUCOSE, CAPILLARY     Status: Abnormal   Collection Time    07/17/12  4:52 PM      Result Value Range   Glucose-Capillary 109 (*) 70 - 99 mg/dL   Comment 1 Notify RN    GLUCOSE, CAPILLARY     Status: Abnormal   Collection Time    07/17/12  8:58 PM      Result Value  Range   Glucose-Capillary 133 (*) 70 - 99 mg/dL  GLUCOSE, CAPILLARY     Status: Abnormal   Collection Time    07/18/12  7:27 AM      Result Value Range   Glucose-Capillary 69 (*) 70 - 99 mg/dL   Comment 1 Notify RN    GLUCOSE, CAPILLARY     Status: None   Collection Time    07/18/12  7:48 AM      Result Value Range   Glucose-Capillary 99  70 - 99 mg/dL   Comment 1 Notify RN       HEENT: normal Cardio: RRR and no murmur Resp: CTA B/L and No Labored breathing GI: BS positive and obese Extremity:  Pulses positive and No Edema Skin:   Intact Neuro: Alert/Oriented, Cranial Nerve II-XII normal, Abnormal Sensory parasthesia in Legs, Abnormal Motor 4/5 in Bilateral quads TA and Gastroc, 3-/5 HF 5/5 in UEs and Tone:  Within Normal Limits Musc/Skel:  Other No MCP or PIP tenderness no RA deformities Gen NAD   Assessment/Plan: 1. Functional deficits secondary to Lumbar spinal stenosis with neurogenic claudication which require 3+ hours per day of interdisciplinary therapy in a comprehensive inpatient rehab setting. Physiatrist is providing close team supervision and 24 hour management of active medical problems listed below. Physiatrist and rehab team continue to assess barriers to discharge/monitor patient progress toward functional and medical goals. FIM: FIM - Bathing Bathing Steps Patient Completed: Chest;Right Arm;Left Arm;Abdomen;Front perineal area;Right upper leg;Left upper leg Bathing: 3: Mod-Patient completes 5-7 35f10 parts or 50-74%  FIM - Upper Body Dressing/Undressing Upper body dressing/undressing steps patient completed: Thread/unthread right bra strap;Thread/unthread left bra strap;Hook/unhook bra;Thread/unthread right sleeve of pullover shirt/dresss;Put head through opening of pull over shirt/dress;Thread/unthread left sleeve of pullover shirt/dress;Pull shirt over trunk Upper body dressing/undressing: 5: Set-up assist to: Obtain clothing/put away FIM - Lower Body  Dressing/Undressing Lower body dressing/undressing steps patient completed: Thread/unthread right underwear leg;Thread/unthread left underwear leg;Thread/unthread right pants leg;Thread/unthread left pants leg;Pull pants up/down Lower body dressing/undressing: 2: Max-Patient completed 25-49% of tasks  FIM - Toileting Toileting steps completed by patient: Adjust clothing prior to toileting;Performs perineal hygiene;Adjust clothing after toileting Toileting Assistive Devices: Grab bar or rail for support Toileting: 5: Supervision: Safety issues/verbal cues  FIM - TRadio producerDevices: WInsurance account managerTransfers: 6-Assistive device: No helper  FIM - BControl and instrumentation engineerDevices: WCopy 6: More than reasonable amt of time  FIM - Locomotion: Wheelchair Locomotion: Wheelchair: 1: Total Assistance/staff pushes wheelchair (Pt<25%) (gait to be primary means of mobility) FIM - Locomotion: Ambulation Ambulation/Gait Assistance: 4: Min assist Locomotion: Ambulation: 1: Travels less than 50 ft with minimal assistance (Pt.>75%)  Comprehension Comprehension Mode: Auditory Comprehension: 7-Follows complex conversation/direction: With no assist  Expression Expression Mode: Verbal Expression: 7-Expresses complex ideas:  With no assist  Social Interaction Social Interaction: 7-Interacts appropriately with others - No medications needed.  Problem Solving Problem Solving: 7-Solves complex problems: Recognizes & self-corrects  Memory Memory: 7-Complete Independence: No helper  Medical Problem List and Plan:  1. DVT Prophylaxis/Anticoagulation: Pharmaceutical: Lovenox  2. Pain Management: Continue neurontin for Severe headaches and left eye pain due to facial nerve irritation. Will change morphine to po route. Use prn severe pain. Symptoms already improving with steroids. Max dose gabapentin, cymbalta 12m 3. Depression:  Continue cymbalta as has been feeling helpless and depressed for a period of time now. She is feeling better about her situation and hopes that CIR will get her back to premorbid level of activity (prior to gamma knife procedure)  4. Neuropsych: This patient is capable of making decisions on his/her own behalf.  5. DM type 2: will continue to monitor BS with ac/hs cbg checks. Anticipate BS getting higher due to steriods. Was started on levemir yesterday. Will continue while she's on higher dose steroids. Continue home medications of trajenta daily.  6. RA: on plaquenil daily.  7. Hypothyroid: continue to supplement.  8. HTN: monitor with bid checks. Continue toprol XL and lasix. Not on benicar or aldactone at this time but may need to resume as intermittent elevations noted. Will monitor for now.        LOS (Days) 3 A FACE TO FACE EVALUATION WAS PERFORMED  KNyoka Cowden3/06/2012, 8:29 AM

## 2012-07-18 NOTE — Progress Notes (Signed)
Hypoglycemic Event  CBG:69  Treatment: 15 GM carbohydrate snack  Symptoms: None  Follow-up CBG: Time:0748 CBG Result:99  Possible Reasons for Event: Unknown  Comments/MD notified:yes    [name], Buyer, retail  Remember to initiate Hypoglycemia Order Set & complete

## 2012-07-19 ENCOUNTER — Inpatient Hospital Stay (HOSPITAL_COMMUNITY): Payer: Medicare Other

## 2012-07-19 ENCOUNTER — Inpatient Hospital Stay (HOSPITAL_COMMUNITY): Payer: Medicare Other | Admitting: Occupational Therapy

## 2012-07-19 DIAGNOSIS — M48061 Spinal stenosis, lumbar region without neurogenic claudication: Secondary | ICD-10-CM

## 2012-07-19 LAB — GLUCOSE, CAPILLARY
Glucose-Capillary: 115 mg/dL — ABNORMAL HIGH (ref 70–99)
Glucose-Capillary: 130 mg/dL — ABNORMAL HIGH (ref 70–99)

## 2012-07-19 NOTE — Progress Notes (Signed)
Physical Therapy Session Note  Patient Details  Name: [name] MRN: [id number] Date of Birth: 05/22/1940  Today's Date: 07/19/2012 Time: 1445-1530 Time Calculation (min): 45 min  Short Term Goals: Week 1:  PT Short Term Goal 1 (Week 1): = LTGs due to LOS  Skilled Therapeutic Interventions/Progress Updates:   Focused on gait training with RW on unit for endurance and strengthening with S; cues for posture. Administered Berg Balance Test (see results below) and discussed results with pt who verbalized understanding.   Therapy Documentation Precautions:  Precautions Precautions: Fall Precaution Comments: R knee may buckle Restrictions Weight Bearing Restrictions: No   Pain: Pain Assessment Pain Score:   3 Pain Location: Knee Pain Orientation: Right Premedicated.    Balance: Berg Balance Test Sit to Stand: Able to stand  independently using hands Standing Unsupported: Able to stand 2 minutes with supervision Sitting with Back Unsupported but Feet Supported on Floor or Stool: Able to sit safely and securely 2 minutes Stand to Sit: Sits safely with minimal use of hands Transfers: Able to transfer safely, definite need of hands Standing Unsupported with Eyes Closed: Able to stand 10 seconds safely Standing Ubsupported with Feet Together: Able to place feet together independently and stand 1 minute safely ((knees touch first)) From Standing, Reach Forward with Outstretched Arm: Reaches forward but needs supervision From Standing Position, Pick up Object from Floor: Unable to try/needs assist to keep balance (min A and able to pick up shoe) From Standing Position, Turn to Look Behind Over each Shoulder: Turn sideways only but maintains balance Turn 360 Degrees: Needs close supervision or verbal cueing Standing Unsupported, Alternately Place Feet on Step/Stool: Needs assistance to keep from falling or unable to try Standing Unsupported, One Foot in Front: Able to take small  step independently and hold 30 seconds Standing on One Leg: Tries to lift leg/unable to hold 3 seconds but remains standing independently Total Score: 32  See FIM for current functional status  Therapy/Group: Individual Therapy  Canary Brim [facility] 07/19/2012, 3:35 PM

## 2012-07-19 NOTE — Progress Notes (Signed)
Physical Therapy Session Note  Patient Details  Name: CHAKITA MCGRAW MRN: [id number] Date of Birth: 1940-09-23  Today's Date: 07/19/2012 Time: 0830-0930 Time Calculation (min): 60 min  Short Term Goals: Week 1:  PT Short Term Goal 1 (Week 1): = LTGs due to LOS  Skilled Therapeutic Interventions/Progress Updates:    Functional mobility in pt room to gait with RW and to use bathroom with overall S. Focused session on [place] Level A HEP including standing hip abduction, hamstring curls, heel/toe raises, mini squats, and LAQ x 10 reps each x 2 sets bilaterally with seated rest breaks. Good carryover noted of transfer techniques, completing sit to stands with S. Gait with RW for endurance and strengthening to/from therapy gym with S; cues for posture intermittently. Left in family room in w/c at computer and nurse tech notified.  Therapy Documentation Precautions:  Precautions Precautions: Fall Restrictions Weight Bearing Restrictions: No    Pain: Pain Assessment Pain Assessment: 0-10 Pain Score:   4 Pain Intervention(s): Medication (See eMAR)  See FIM for current functional status  Therapy/Group: Individual Therapy  Canary Brim [facility] 07/19/2012, 9:04 AM

## 2012-07-19 NOTE — Progress Notes (Signed)
Patient ID: [name], female   DOB: 01/28/1941, 72 y.o.   MRN: [id number] Subjective/Complaints: 72 y.o. female with history of RA, DM, with diabetic neuropathy, meningioma resection; who was admitted on 07/12/12 with shooting pain in BLE with RLE instability and inability to walk. Patient with reports of fall three weeks PTA with soreness left hip and left knee. MRI lumbar spine revealed moderate to marked spinal stenosis L4-5, moderate stenosis L3-4 and mild to moderate stenosis L2-3. She was evaluated by Dr. Rita Ohara who recommended PT/OT for proximal LE weakness as patient with multiple cormorbidities. Was started on IV steroids which has helped with pain management and overall mobility  Review of Systems  Musculoskeletal: Positive for myalgias and joint pain.  Neurological: Positive for sensory change and focal weakness.  All other systems reviewed and are negative.    Objective: Vital Signs: Blood pressure 150/95, pulse 78, temperature 97.6 F (36.4 C), temperature source Oral, resp. rate 20, SpO2 97.00%. No results found. Results for orders placed during the hospital encounter of 07/15/12 (from the past 72 hour(s))  CBC WITH DIFFERENTIAL     Status: Abnormal   Collection Time    07/16/12  8:50 AM      Result Value Range   WBC 11.2 (*) 4.0 - 10.5 K/uL   RBC 4.43  3.87 - 5.11 MIL/uL   Hemoglobin 12.2  12.0 - 15.0 g/dL   HCT 37.0  36.0 - 46.0 %   MCV 83.5  78.0 - 100.0 fL   MCH 27.5  26.0 - 34.0 pg   MCHC 33.0  30.0 - 36.0 g/dL   RDW 14.6  11.5 - 15.5 %   Platelets 418 (*) 150 - 400 K/uL   Neutrophils Relative 80 (*) 43 - 77 %   Neutro Abs 8.9 (*) 1.7 - 7.7 K/uL   Lymphocytes Relative 14  12 - 46 %   Lymphs Abs 1.6  0.7 - 4.0 K/uL   Monocytes Relative 6  3 - 12 %   Monocytes Absolute 0.7  0.1 - 1.0 K/uL   Eosinophils Relative 0  0 - 5 %   Eosinophils Absolute 0.0  0.0 - 0.7 K/uL   Basophils Relative 0  0 - 1 %   Basophils Absolute 0.0  0.0 - 0.1 K/uL  COMPREHENSIVE  METABOLIC PANEL     Status: Abnormal   Collection Time    07/16/12  8:50 AM      Result Value Range   Sodium 136  135 - 145 mEq/L   Potassium 4.0  3.5 - 5.1 mEq/L   Chloride 96  96 - 112 mEq/L   CO2 31  19 - 32 mEq/L   Glucose, Bld 165 (*) 70 - 99 mg/dL   BUN 39 (*) 6 - 23 mg/dL   Creatinine, Ser 1.27 (*) 0.50 - 1.10 mg/dL   Calcium 9.3  8.4 - 10.5 mg/dL   Total Protein 8.1  6.0 - 8.3 g/dL   Albumin 2.9 (*) 3.5 - 5.2 g/dL   AST 13  0 - 37 U/L   ALT 16  0 - 35 U/L   Alkaline Phosphatase 57  39 - 117 U/L   Total Bilirubin 0.3  0.3 - 1.2 mg/dL   GFR calc non Af Amer 42 (*) >90 mL/min   GFR calc Af Amer 48 (*) >90 mL/min   Comment:            The eGFR has been calculated     using  the CKD EPI equation.     This calculation has not been     validated in all clinical     situations.     eGFR's persistently     <90 mL/min signify     possible Chronic Kidney Disease.  GLUCOSE, CAPILLARY     Status: None   Collection Time    07/16/12 11:31 AM      Result Value Range   Glucose-Capillary 92  70 - 99 mg/dL   Comment 1 Notify RN    GLUCOSE, CAPILLARY     Status: Abnormal   Collection Time    07/16/12  4:15 PM      Result Value Range   Glucose-Capillary 138 (*) 70 - 99 mg/dL   Comment 1 Notify RN    GLUCOSE, CAPILLARY     Status: Abnormal   Collection Time    07/16/12  8:50 PM      Result Value Range   Glucose-Capillary 104 (*) 70 - 99 mg/dL   Comment 1 Notify RN    GLUCOSE, CAPILLARY     Status: None   Collection Time    07/17/12  7:27 AM      Result Value Range   Glucose-Capillary 92  70 - 99 mg/dL   Comment 1 Notify RN    GLUCOSE, CAPILLARY     Status: None   Collection Time    07/17/12 11:33 AM      Result Value Range   Glucose-Capillary 95  70 - 99 mg/dL   Comment 1 Notify RN    GLUCOSE, CAPILLARY     Status: Abnormal   Collection Time    07/17/12  4:52 PM      Result Value Range   Glucose-Capillary 109 (*) 70 - 99 mg/dL   Comment 1 Notify RN    GLUCOSE,  CAPILLARY     Status: Abnormal   Collection Time    07/17/12  8:58 PM      Result Value Range   Glucose-Capillary 133 (*) 70 - 99 mg/dL  GLUCOSE, CAPILLARY     Status: Abnormal   Collection Time    07/18/12  7:27 AM      Result Value Range   Glucose-Capillary 69 (*) 70 - 99 mg/dL   Comment 1 Notify RN    GLUCOSE, CAPILLARY     Status: None   Collection Time    07/18/12  7:48 AM      Result Value Range   Glucose-Capillary 99  70 - 99 mg/dL   Comment 1 Notify RN    GLUCOSE, CAPILLARY     Status: None   Collection Time    07/18/12 11:53 AM      Result Value Range   Glucose-Capillary 98  70 - 99 mg/dL   Comment 1 Notify RN    GLUCOSE, CAPILLARY     Status: Abnormal   Collection Time    07/18/12  4:46 PM      Result Value Range   Glucose-Capillary 140 (*) 70 - 99 mg/dL   Comment 1 Notify RN    GLUCOSE, CAPILLARY     Status: Abnormal   Collection Time    07/18/12  9:12 PM      Result Value Range   Glucose-Capillary 140 (*) 70 - 99 mg/dL     HEENT: normal Cardio: RRR and no murmur Resp: CTA B/L and No Labored breathing GI: BS positive and obese Extremity:  Pulses positive and No Edema Skin:  Intact Neuro: Alert/Oriented, Cranial Nerve II-XII normal, Abnormal Sensory parasthesia in Legs, Abnormal Motor 4/5 in Bilateral quads TA and Gastroc, 3-/5 HF 5/5 in UEs and Tone:  Within Normal Limits Musc/Skel:  Other No MCP or PIP tenderness no RA deformities Gen NAD   Assessment/Plan: 1. Functional deficits secondary to Lumbar spinal stenosis with neurogenic claudication which require 3+ hours per day of interdisciplinary therapy in a comprehensive inpatient rehab setting. Physiatrist is providing close team supervision and 24 hour management of active medical problems listed below. Physiatrist and rehab team continue to assess barriers to discharge/monitor patient progress toward functional and medical goals. FIM: FIM - Bathing Bathing Steps Patient Completed: Chest;Right  Arm;Left Arm;Abdomen;Front perineal area;Buttocks;Right upper leg;Left upper leg Bathing: 4: Min-Patient completes 8-9 19f10 parts or 75+ percent  FIM - Upper Body Dressing/Undressing Upper body dressing/undressing steps patient completed: Thread/unthread right bra strap;Thread/unthread left bra strap;Thread/unthread right sleeve of pullover shirt/dresss;Thread/unthread left sleeve of pullover shirt/dress;Put head through opening of pull over shirt/dress;Pull shirt over trunk Upper body dressing/undressing: 4: Min-Patient completed 75 plus % of tasks FIM - Lower Body Dressing/Undressing Lower body dressing/undressing steps patient completed: Thread/unthread right underwear leg;Thread/unthread left underwear leg;Pull underwear up/down;Thread/unthread right pants leg;Thread/unthread left pants leg;Don/Doff left sock;Don/Doff right shoe;Don/Doff left shoe;Pull pants up/down Lower body dressing/undressing: 4: Min-Patient completed 75 plus % of tasks  FIM - Toileting Toileting steps completed by patient: Adjust clothing prior to toileting;Performs perineal hygiene;Adjust clothing after toileting Toileting Assistive Devices: Grab bar or rail for support Toileting: 5: Supervision: Safety issues/verbal cues  FIM - TRadio producerDevices: WMining engineerTransfers: 5-To toilet/BSC: Supervision (verbal cues/safety issues)  FIM - BControl and instrumentation engineerDevices: WCopy 6: Sit > Supine: No assist;6: Supine > Sit: No assist;5: Bed > Chair or W/C: Supervision (verbal cues/safety issues);5: Chair or W/C > Bed: Supervision (verbal cues/safety issues)  FIM - Locomotion: Wheelchair Locomotion: Wheelchair: 1: Total Assistance/staff pushes wheelchair (Pt<25%) (gait to be primary means of mobility) FIM - Locomotion: Ambulation Ambulation/Gait Assistance: 4: Min assist Locomotion: Ambulation: 1: Travels less than 50 ft with minimal  assistance (Pt.>75%)  Comprehension Comprehension Mode: Auditory Comprehension: 7-Follows complex conversation/direction: With no assist  Expression Expression Mode: Verbal Expression: 7-Expresses complex ideas: With no assist  Social Interaction Social Interaction: 7-Interacts appropriately with others - No medications needed.  Problem Solving Problem Solving: 7-Solves complex problems: Recognizes & self-corrects  Memory Memory: 7-Complete Independence: No helper  Medical Problem List and Plan:  1. DVT Prophylaxis/Anticoagulation: Pharmaceutical: Lovenox  2. Pain Management: Continue neurontin for Severe headaches and left eye pain due to facial nerve irritation. Will change morphine to po route. Use prn severe pain. Symptoms already improving with steroids. Max dose gabapentin, cymbalta 653m3. Depression: Continue cymbalta as has been feeling helpless and depressed for a period of time now. She is feeling better about her situation and hopes that CIR will get her back to premorbid level of activity (prior to gamma knife procedure for meningioma)  4. Neuropsych: This patient is capable of making decisions on his/her own behalf.  5. DM type 2: will continue to monitor BS with ac/hs cbg checks. Anticipate BS getting higher due to steriods. Was started on levemir yesterday. Will continue while she's on higher dose steroids. Continue home medications of trajenta daily.  6. RA: on plaquenil daily.  7. Hypothyroid: continue to supplement.  8. HTN: monitor with bid checks. Continue toprol XL and lasix. Not on benicar or aldactone at  this time but may need to resume as intermittent elevations noted. Will monitor for now.        LOS (Days) 4 A FACE TO FACE EVALUATION WAS PERFORMED  KIRSTEINS,ANDREW E 07/19/2012, 7:13 AM

## 2012-07-19 NOTE — Progress Notes (Signed)
Occupational Therapy Session Note  Patient Details  Name: [name] MRN: [id number] Date of Birth: 1941-01-10  Today's Date: 07/19/2012 Time: 1105-1200 Time Calculation (min): 55 min  Short Term Goals: Week 1:  OT Short Term Goal 1 (Week 1): Patient will perform shower transfer with supervision OT Short Term Goal 2 (Week 1): Patient will perform toilet transfer with supervision OT Short Term Goal 3 (Week 1): Patient will perform LB dressing with min assist using AE prn OT Short Term Goal 4 (Week 1): Patient will perform UB/LB bathing with minimal assistance  Skilled Therapeutic Interventions/Progress Updates:  Patient found seated in bed side chair with no complaints of pain. Patient engaged in functional mobility throughout room using rolling walker in order to gather necessary items for bathing & dressing. Patient then transferred onto tub transfer bench in walk-in shower for UB/LB bathing in sit->stand position. Patient then ambulated out of bathroom -> bed side chair for UB/LB dressing using AE prn to assist with socks and shoes. Patient then ambulated throughout room with rolling walker to clean up after self using reacher prn and stood at sink for grooming tasks. Focused skilled intervention on UB/LB bathing & dressing, functional mobility/ambulation throughout room using rolling walker, sit<>stands, dynamic standing balance/tolerance/endurance, safety with rolling walker (gave patient walker bag), and overall activity tolerance/endurance. At end of session left patient seated in bed side chair with call bell & phone within reach.   Precautions:  Precautions Precautions: Fall Restrictions Weight Bearing Restrictions: No  See FIM for current functional status  Therapy/Group: Individual Therapy  Feleica Fulmore 07/19/2012, 12:10 PM

## 2012-07-19 NOTE — Evaluation (Signed)
Recreational Therapy Assessment and Plan  Patient Details  Name: [name] MRN: [id number] Date of Birth: December 21, 1940 Today's Date: 07/19/2012  Rehab Potential: Good ELOS: 10 days   Assessment Clinical Impression: Problem List:  Patient Active Problem List   Diagnosis   .  High cholesterol   .  Thyroid disease   .  Hypertension   .  Brain mass   .  Chest pain   .  Rheumatoid arthritis   .  Osteoarthritis   .  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   .  Spinal stenosis of lumbar region with radiculopathy   .  Inability to walk   .  Acute lumbar back pain   .  Asymptomatic bacteriuria    Past Medical History:  Past Medical History   Diagnosis  Date   .  Diabetes mellitus    .  Hypertension    .  Thyroid disease    .  Arthritis      endstage changes bilateral knees/bilateral ankles.   .  High cholesterol    .  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  02/17/2012     Continue diplopia, left eye pain and left headaches.   .  Depression, reactive    .  RA (rheumatoid arthritis)      has been off methotreaxte since 10/13.   .  Contusion of left knee      due to fall 1/14.    Past Surgical History:  Past Surgical History   Procedure  Laterality  Date   .  Abdominal hysterectomy     .  Ovary surgery     .  Shoulder surgery     .  Brain surgery       Gamma knife 10/13. Needs repeat spring '14    Assessment & Plan  Clinical Impression: Patient is a 72 y.o. year old female with recent admission to the hospital with history of RA, DM, with diabetic neuropathy, meningioma resection; who was admitted on 07/12/12 with shooting pain in BLE with RLE instability and inability to walk. Patient with reports of fall three weeks PTA with soreness left hip and left knee. MRI lumbar spine revealed moderate to marked spinal stenosis L4-5, moderate stenosis L3-4 and mild to moderate stenosis L2-3. She was evaluated by Dr. Rita Ohara who recommended  PT/OT for proximal LE weakness as patient with multiple cormorbidities. Was started on IV steroids which has helped with pain management and overall mobility. Therapy initiated and patient noted to be limited by LE weakness and pain. Patient transferred to CIR on 07/15/2012.   Pt presents with decreased activity tolerance, decreased functional mobility, decreased balance Limiting pt's independence with leisure/community pursuits.   Leisure History/Participation Premorbid leisure interest/current participation: Medical laboratory scientific officer - Building control surveyor - Doctor, hospital - Travel (Comment) Other Leisure Interests: Cooking/Baking;Television;Reading Leisure Participation Style: Alone;With Family/Friends Awareness of Community Resources: Good-identify 3 post discharge leisure resources Psychosocial / Spiritual Spiritual Interests: Church;Womens'Men's Groups Social interaction - Mood/Behavior: Cooperative Engineer, drilling for Education?: Yes Patient Agreeable to [employer]?: Yes Recreational Therapy Orientation Orientation -Reviewed with patient: Available activity resources Strengths/Weaknesses Patient Strengths/Abilities: Willingness to participate Patient weaknesses: Physical limitations  Plan Rec Therapy Plan Is patient appropriate for Therapeutic Recreation?: Yes Rehab Potential: Good Treatment times per week: Min 2 x week >20 minutes Estimated Length of Stay: 10 days TR Treatment/Interventions: Adaptive equipment instruction;1:1 session;Balance/vestibular training;Functional mobility training;Community reintegration;Patient/family education;Therapeutic activities;Recreation/leisure participation;Therapeutic exercise;UE/LE Coordination activities  Recommendations for other services:  None  Discharge Criteria: Patient will be discharged from TR if patient refuses treatment 3 consecutive times without medical reason.  If treatment goals not met, if there is a change in  medical status, if patient makes no progress towards goals or if patient is discharged from hospital.  The above assessment, treatment plan, treatment alternatives and goals were discussed and mutually agreed upon: by patient  [place] 07/19/2012, 12:02 PM

## 2012-07-19 NOTE — Progress Notes (Signed)
Physical Therapy Session Note  Patient Details  Name: [name] MRN: [id number] Date of Birth: Dec 30, 1940  Today's Date: 07/19/2012 Time: 9198-0221 Time Calculation (min): 33 min  Short Term Goals: Week 1:  PT Short Term Goal 1 (Week 1): = LTGs due to LOS  Skilled Therapeutic Interventions/Progress Updates:  Gait room to/from gym x 120' With RW , supervision.  Kinetron for functional strengthening: in sitting x 40/cm/sec x 20 cycles , in standing with bil UE support on handles at 20 cm/sec x 20, in standing with bil UE support on therapist x 16 cycles.  Pt required min assist for wt shift to RLE, and required rest due to increased R knee pain/feeling of instability.  Trunk strengthening/balance in sitting with feet unsupported: "walking" hips forward/backward without use of UEs, with difficulty. Trunk shortening/lengthening with wt shifting, feet slightly supported, reaching out of BOS to R or L with ipsilateral hand, without LOB, demonstrating trunk activation on L and R.    Therapy Documentation Precautions:  Precautions Precautions: Fall Precaution Comments: R knee may buckle Restrictions Weight Bearing Restrictions: No     Pain: Pain Assessment Pain Score:   3 Pain Location: Knee Pain Orientation: Right Pain Intervention(s): Medication (See eMAR)     See FIM for current functional status  Therapy/Group: Individual Therapy  COOK,CAROLINE 07/19/2012, 2:59 PM

## 2012-07-20 ENCOUNTER — Inpatient Hospital Stay (HOSPITAL_COMMUNITY): Payer: Medicare Other

## 2012-07-20 ENCOUNTER — Inpatient Hospital Stay (HOSPITAL_COMMUNITY): Payer: Medicare Other | Admitting: Occupational Therapy

## 2012-07-20 ENCOUNTER — Inpatient Hospital Stay (HOSPITAL_COMMUNITY): Payer: Medicare Other | Admitting: *Deleted

## 2012-07-20 DIAGNOSIS — M48061 Spinal stenosis, lumbar region without neurogenic claudication: Secondary | ICD-10-CM

## 2012-07-20 LAB — GLUCOSE, CAPILLARY
Glucose-Capillary: 74 mg/dL (ref 70–99)
Glucose-Capillary: 99 mg/dL (ref 70–99)
Glucose-Capillary: 107 mg/dL — ABNORMAL HIGH (ref 70–99)
Glucose-Capillary: 141 mg/dL — ABNORMAL HIGH (ref 70–99)

## 2012-07-20 MED ORDER — ENOXAPARIN SODIUM 40 MG/0.4ML ~~LOC~~ SOLN
40.0000 mg | SUBCUTANEOUS | Status: DC
  Administered 2012-07-20 – 2012-07-23 (×4): 40 mg via SUBCUTANEOUS
  Filled 2012-07-20 (×5): qty 0.4

## 2012-07-20 MED ORDER — IRBESARTAN 75 MG PO TABS
75.0000 mg | ORAL_TABLET | Freq: Every day | ORAL | Status: DC
  Administered 2012-07-20 – 2012-07-24 (×5): 75 mg via ORAL
  Filled 2012-07-20 (×6): qty 1

## 2012-07-20 NOTE — Progress Notes (Signed)
Physical Therapy Note  Patient Details  Name: [name] MRN: [id number] Date of Birth: [birth year] Today's Date: 07/20/2012  9:30 - 10:00 30 minutes Individual session Patient denies pain. Patient reports she has had pain in her back and occasionally her knees.  Patient ambulated 150+ feet x 2 with rolling walker and supervision. Patient performed car transfer with min assist to lift left leg into car. Patient practiced sit to stand from sofa and requires mod lifting assist from low surface. Patient left in chair with all items within reach.   Elder Love M 07/20/2012, 11:11 AM

## 2012-07-20 NOTE — Progress Notes (Signed)
Physical Therapy Session Note  Patient Details  Name: [name] MRN: [id number] Date of Birth: 05-31-1940  Today's Date: 07/20/2012 Time: 0825-0925 Time Calculation (min): 60 min  Short Term Goals: Week 1:  PT Short Term Goal 1 (Week 1): = LTGs due to LOS  Skilled Therapeutic Interventions/Progress Updates:    Functional gait in pt room with RW to put away clothing items. Gait down to therapy gym with S; decreased cadence but less cues needed for upright posture for endurance and strengthening. Practiced curb step with RW for home entry with min A; cues for which foot to lead with and increased anxiety when stepping down. Standing mini squats x 10 reps for functional strengthening without UE support. Progressed activity to include balance and body awareness using therapy ball while completing sit to stands then in standing completed PNF diagonals to both sides x 10 reps each with S. Pt with discomfort in L eye and burning sensation which she reports occurs at home; supine rest break until symptoms resolved after a few minutes. Nustep for functional strengthening and endurance x 10 min on level 4. S with transfers and gait in therapy gym.  Therapy Documentation Precautions:  Precautions Precautions: Fall Precaution Comments: R knee may buckle Restrictions Weight Bearing Restrictions: No  Pain:  Reports a little pain in R leg but premedicated.  See FIM for current functional status  Therapy/Group: Individual Therapy  Canary Brim [facility] 07/20/2012, 9:26 AM

## 2012-07-20 NOTE — Progress Notes (Signed)
Physical Therapy Session Note  Patient Details  Name: [name] MRN: [id number] Date of Birth: Oct 09, 1940  Today's Date: 07/20/2012 Time: 1445-1530 Time Calculation (min): 45 min  Short Term Goals: Week 1:  PT Short Term Goal 1 (Week 1): = LTGs due to LOS  Skilled Therapeutic Interventions/Progress Updates:   Pt somewhat lethargic this session and presents with decreased safety awareness and problem solving. Therapeutic activity with recreational therapy for functional mobility with RW in the gift shop (including navigating obstacles, getting items off of shelf, and managing money at register with overall close S). Practiced furniture transfer to low armchair in the solarium and education on home adaptation/recommendations. Pt reports family already ordering her a lift chair and educated pt on not using the lift chair function to continue to build her strength and not regress when she goes home. Pt attempted to stand and walk without using RW; requiring cues for memory and overall safety with mobility. Notified RN of changes in mental status at this time.  Therapy Documentation Precautions:  Precautions Precautions: Fall Precaution Comments: R knee may buckle Restrictions Weight Bearing Restrictions: No   Pain:  No complaints of pain.  See FIM for current functional status  Therapy/Group: Individual Therapy  Canary Brim [facility] 07/20/2012, 3:53 PM

## 2012-07-20 NOTE — Progress Notes (Signed)
Patient ID: [name], female   DOB: 11-Jul-1940, 72 y.o.   MRN: [id number] Subjective/Complaints: Very pleased with progress.  Review of Systems  Musculoskeletal: Positive for myalgias and joint pain.  Neurological: Positive for sensory change and focal weakness.  All other systems reviewed and are negative.    Objective: Vital Signs: Blood pressure 142/82, pulse 80, temperature 98.1 F (36.7 C), temperature source Oral, resp. rate 16, SpO2 97.00%. No results found. Results for orders placed during the hospital encounter of 07/15/12 (from the past 72 hour(s))  GLUCOSE, CAPILLARY     Status: None   Collection Time    07/17/12 11:33 AM      Result Value Range   Glucose-Capillary 95  70 - 99 mg/dL   Comment 1 Notify RN    GLUCOSE, CAPILLARY     Status: Abnormal   Collection Time    07/17/12  4:52 PM      Result Value Range   Glucose-Capillary 109 (*) 70 - 99 mg/dL   Comment 1 Notify RN    GLUCOSE, CAPILLARY     Status: Abnormal   Collection Time    07/17/12  8:58 PM      Result Value Range   Glucose-Capillary 133 (*) 70 - 99 mg/dL  GLUCOSE, CAPILLARY     Status: Abnormal   Collection Time    07/18/12  7:27 AM      Result Value Range   Glucose-Capillary 69 (*) 70 - 99 mg/dL   Comment 1 Notify RN    GLUCOSE, CAPILLARY     Status: None   Collection Time    07/18/12  7:48 AM      Result Value Range   Glucose-Capillary 99  70 - 99 mg/dL   Comment 1 Notify RN    GLUCOSE, CAPILLARY     Status: None   Collection Time    07/18/12 11:53 AM      Result Value Range   Glucose-Capillary 98  70 - 99 mg/dL   Comment 1 Notify RN    GLUCOSE, CAPILLARY     Status: Abnormal   Collection Time    07/18/12  4:46 PM      Result Value Range   Glucose-Capillary 140 (*) 70 - 99 mg/dL   Comment 1 Notify RN    GLUCOSE, CAPILLARY     Status: Abnormal   Collection Time    07/18/12  9:12 PM      Result Value Range   Glucose-Capillary 140 (*) 70 - 99 mg/dL  GLUCOSE, CAPILLARY      Status: None   Collection Time    07/19/12  7:11 AM      Result Value Range   Glucose-Capillary 80  70 - 99 mg/dL   Comment 1 Notify RN    GLUCOSE, CAPILLARY     Status: Abnormal   Collection Time    07/19/12 11:41 AM      Result Value Range   Glucose-Capillary 115 (*) 70 - 99 mg/dL   Comment 1 Notify RN    GLUCOSE, CAPILLARY     Status: Abnormal   Collection Time    07/19/12  4:31 PM      Result Value Range   Glucose-Capillary 145 (*) 70 - 99 mg/dL   Comment 1 Notify RN    GLUCOSE, CAPILLARY     Status: Abnormal   Collection Time    07/19/12  8:18 PM      Result Value Range   Glucose-Capillary  130 (*) 70 - 99 mg/dL  GLUCOSE, CAPILLARY     Status: None   Collection Time    07/20/12  7:25 AM      Result Value Range   Glucose-Capillary 74  70 - 99 mg/dL   Comment 1 Notify RN       HEENT: normal Cardio: RRR and no murmur Resp: CTA B/L and No Labored breathing GI: BS positive and obese Extremity:  Pulses positive and No Edema Skin:   Intact Neuro: Alert/Oriented, Cranial Nerve II-XII normal, Abnormal Sensory parasthesia in Legs, Abnormal Motor 4/5 in Bilateral quads TA and Gastroc, 3 to 4-/5 HF 5/5 in UEs and Tone:  Within Normal Limits Musc/Skel:  Other No MCP or PIP tenderness no RA deformities Gen NAD   Assessment/Plan: 1. Functional deficits secondary to Lumbar spinal stenosis with neurogenic claudication which require 3+ hours per day of interdisciplinary therapy in a comprehensive inpatient rehab setting. Physiatrist is providing close team supervision and 24 hour management of active medical problems listed below. Physiatrist and rehab team continue to assess barriers to discharge/monitor patient progress toward functional and medical goals. FIM: FIM - Bathing Bathing Steps Patient Completed: Chest;Right Arm;Left Arm;Abdomen;Front perineal area;Buttocks;Right upper leg;Left upper leg;Right lower leg (including foot);Left lower leg (including foot) Bathing: 6:  Assistive device (Comment)  FIM - Upper Body Dressing/Undressing Upper body dressing/undressing steps patient completed: Thread/unthread right bra strap;Thread/unthread left bra strap;Hook/unhook bra;Thread/unthread right sleeve of pullover shirt/dresss;Thread/unthread left sleeve of pullover shirt/dress;Put head through opening of pull over shirt/dress;Pull shirt over trunk Upper body dressing/undressing: 5: Supervision: Safety issues/verbal cues FIM - Lower Body Dressing/Undressing Lower body dressing/undressing steps patient completed: Thread/unthread right underwear leg;Thread/unthread left underwear leg;Pull underwear up/down;Thread/unthread right pants leg;Thread/unthread left pants leg;Pull pants up/down;Don/Doff right sock;Don/Doff left sock;Don/Doff right shoe;Fasten/unfasten right shoe Lower body dressing/undressing: 5: Supervision: Safety issues/verbal cues  FIM - Toileting Toileting steps completed by patient: Adjust clothing prior to toileting;Performs perineal hygiene;Adjust clothing after toileting Toileting Assistive Devices: Grab bar or rail for support Toileting: 0: Activity did not occur  FIM - Radio producer Devices: Mining engineer Transfers: 0-Activity did not occur  FIM - Control and instrumentation engineer Devices: Environmental consultant;Arm rests Bed/Chair Transfer: 6: Supine > Sit: No assist;5: Bed > Chair or W/C: Supervision (verbal cues/safety issues)  FIM - Locomotion: Wheelchair Locomotion: Wheelchair: 0: Activity did not occur FIM - Locomotion: Ambulation Locomotion: Ambulation Assistive Devices: Administrator Ambulation/Gait Assistance: 5: Supervision Locomotion: Ambulation: 2: Travels 50 - 149 ft with supervision/safety issues  Comprehension Comprehension Mode: Auditory Comprehension: 7-Follows complex conversation/direction: With no assist  Expression Expression Mode: Verbal Expression: 7-Expresses complex ideas:  With no assist  Social Interaction Social Interaction: 7-Interacts appropriately with others - No medications needed.  Problem Solving Problem Solving: 7-Solves complex problems: Recognizes & self-corrects  Memory Memory: 7-Complete Independence: No helper  Medical Problem List and Plan:  1. DVT Prophylaxis/Anticoagulation: Pharmaceutical: Lovenox  2. Pain Management: Continue neurontin for Severe headaches and left eye pain due to facial nerve irritation. Will changed morphine to po route. Use prn severe pain. Symptoms already improving with steroids. Max dose gabapentin, cymbalta 63m 3. Depression: Continue cymbalta as has been feeling helpless and depressed for a period of time now. She is feeling better about her situation and hopes that CIR will get her back to premorbid level of activity (prior to gamma knife procedure for meningioma)  4. Neuropsych: This patient is capable of making decisions on his/her own behalf.  5. DM  type 2: will continue to monitor BS with ac/hs cbg checks. Anticipate BS getting higher due to steriods. Was started on levemir yesterday. Will continue while she's on higher dose steroids. Continue home medications of trajenta daily. Sugars reasonable at present 6. RA: on plaquenil daily.  7. Hypothyroid: continue to supplement.  8. HTN: monitor with bid checks. Continue toprol XL and lasix. Not on benicar or aldactone at this time but will resume benicar.        LOS (Days) 5 A FACE TO FACE EVALUATION WAS PERFORMED  SWARTZ,ZACHARY T 07/20/2012, 8:18 AM

## 2012-07-20 NOTE — Progress Notes (Signed)
[facility] Individual Statement of Services  Patient Name:  [name]  Date:  07/20/2012  Welcome to the [facility].  Our goal is to provide you with an individualized program based on your diagnosis and situation, designed to meet your specific needs.  With this comprehensive rehabilitation program, you will be expected to participate in at least 3 hours of rehabilitation therapies Monday-Friday, with modified therapy programming on the weekends.  Your rehabilitation program will include the following services:  Physical Therapy (PT), Occupational Therapy (OT), 24 hour per day rehabilitation nursing, Therapeutic Recreaction (TR), Case Management (Social Worker), Rehabilitation Medicine and Nutrition Services  Weekly team conferences will be held on Tuesdays to discuss your progress.  Your  Social Worker will talk with you frequently to get your input and to update you on team discussions.  Team conferences with you and your family in attendance may also be held.  Expected length of stay: 7-10 days  Overall anticipated outcome: modified independent  Depending on your progress and recovery, your program may change.  Your  Social Worker will coordinate services and will keep you informed of any changes.  Your  Social Worker's name and contact numbers are listed  below.  The following services may also be recommended but are not provided by the [facility] will be made to provide these services after discharge if needed.  Arrangements include referral to agencies that provide these services.  Your insurance has been verified to be:  Medicare and [employer] Your primary doctor is:  Dr. Glendale Chard  Pertinent information will be shared with your doctor and your insurance company.   Social Worker:  [place],  [facility] or ([phone]  Information discussed with and copy given to patient by: Lennart Pall, 07/20/2012, 9:00 AM

## 2012-07-20 NOTE — Progress Notes (Signed)
Recreational Therapy Session Note  Patient Details  Name: [name] MRN: [id number] Date of Birth: [birth year] Today's Date: 07/20/2012 Time: 1445-1530 Pain: no c/o Skilled Therapeutic Interventions/Progress Updates: Pt somewhat lethargic this session and presents with decreased safety awareness and problem solving. Session focused on activity tolerance, indoor community type mobility using RW in hospital gift shop.  Pt at close supervision level for ambulation with RW in gifts shop on carpeted surfaces negotiating obstacles, retrieving items from various heights of shelves, and performing money management.  Pt performed furniture transfer to low armchair in the solarium with supervision.  Educated pt on adaptations for raising surface heights of furniture at home to allow more independence.  Pt reports family already ordering her a lift chair, educated pt on not using the lift chair function to continue to build her strength and not regress when she goes home. Pt attempted to stand and walk without using RW; requiring cues for memory and overall safety with mobility. Notified RN of changes in mental status at this time.   Therapy/Group: Co-Treatment  SIMPSON,LISA 07/20/2012, 4:10 PM

## 2012-07-20 NOTE — Progress Notes (Signed)
Occupational Therapy Session Note  Patient Details  Name: [name] MRN: [id number] Date of Birth: 05-23-1940  Today's Date: 07/20/2012 Time: 1105-1205 Time Calculation (min): 60 min  Short Term Goals: Week 1:  OT Short Term Goal 1 (Week 1): Patient will perform shower transfer with supervision OT Short Term Goal 2 (Week 1): Patient will perform toilet transfer with supervision OT Short Term Goal 3 (Week 1): Patient will perform LB dressing with min assist using AE prn OT Short Term Goal 4 (Week 1): Patient will perform UB/LB bathing with minimal assistance  Skilled Therapeutic Interventions/Progress Updates:  Patient found seated in w/c maneuvering around room upon entering room. Once therapist present patient ambulated throughout room with rolling walker to gather all necessary items for ADL. Patient then ambulated into bathroom for shower stall transfer onto tub transfer bench for UB/LB bathing at shower level using long handled sponge to increase independence. Patient then ambulated back to bed side chair for UB/LB dressing in sit<>stand position. Patient functioning at an overall supervision level for BADLs and functional mobility/ambulation with use of rolling walker. Focused skilled intervention on UB/LB bathing & dressing, sit<>stands, dynamic standing, functional mobility with rolling walker, and overall activity tolerance/endurance. Patient takes more than reasonable amount of time to complete tasks, but completes tasks safely and effectively and takes breaks prn. At end of session left patient seated in bed side chair with lunch tray, call bell and phone within reach.   Precautions:  Precautions Precautions: Fall Precaution Comments: R knee may buckle Restrictions Weight Bearing Restrictions: No  See FIM for current functional status  Therapy/Group: Individual Therapy  CLAY,PATRICIA 07/20/2012, 12:09 PM

## 2012-07-21 ENCOUNTER — Inpatient Hospital Stay (HOSPITAL_COMMUNITY): Payer: Medicare Other

## 2012-07-21 ENCOUNTER — Inpatient Hospital Stay (HOSPITAL_COMMUNITY): Payer: Medicare Other | Admitting: *Deleted

## 2012-07-21 DIAGNOSIS — M48061 Spinal stenosis, lumbar region without neurogenic claudication: Secondary | ICD-10-CM

## 2012-07-21 LAB — GLUCOSE, CAPILLARY
Glucose-Capillary: 78 mg/dL (ref 70–99)
Glucose-Capillary: 88 mg/dL (ref 70–99)

## 2012-07-21 MED ORDER — SIMETHICONE 80 MG PO CHEW
40.0000 mg | CHEWABLE_TABLET | Freq: Four times a day (QID) | ORAL | Status: DC
  Administered 2012-07-21 (×3): 40 mg via ORAL
  Filled 2012-07-21 (×9): qty 1

## 2012-07-21 MED ORDER — SIMETHICONE 40 MG/0.6ML PO SUSP: 40.0000 mg | Freq: Four times a day (QID) | ORAL | Status: DC

## 2012-07-21 NOTE — Progress Notes (Signed)
Occupational Therapy Note  Patient Details  Name: [name] MRN: [id number] Date of Birth: 1941/03/30 Today's Date: 07/21/2012  Time: 9am-10:10am (51mn.)  Pt seen for 1:1 OT session with COTA focusing on BADL's, transfers, standing balance and activity tolerance. Pt supine in bed upon arrival and c/o 3/10 pain in lower back into leg. Pt reports she had all her medication about a half hour ago. Pt used RW with increased time to ambulate around room to gather items needed for bathing and dressing before going in to shower. Pt completed bathing from tub bench, using LH sponge when necessary. Pt reports her dtr will be obtaining a reacher and sock aide for her before going home later this week. Pt sat in chair beside bed to dress and stood at sink to complete grooming. Pt able to use sock aide to donn socks, after TED hose was donned by therapist. Pt used RW to return items in room. Pt fatigues easily and takes rest breaks when needed. Pt able to stand at sink to complete oral hygiene x 4-5 min. Sounds as if pt has strong support system at home with husband and 2 daughter's that will be helping her out upon d/c. Pt reported less pain at end of session, at 1/10 in low back.   Cristen M Fogle 07/21/2012, 10:20 AM

## 2012-07-21 NOTE — Progress Notes (Signed)
Patient ID: [name], female   DOB: 1940/12/08, 72 y.o.   MRN: [id number] Subjective/Complaints: Slept well. Has more energy today. Pain is controlled.  Review of Systems  Musculoskeletal: Positive for myalgias and joint pain.  Neurological: Positive for sensory change and focal weakness.  All other systems reviewed and are negative.    Objective: Vital Signs: Blood pressure 130/82, pulse 90, temperature 99.3 F (37.4 C), temperature source Oral, resp. rate 18, height 5' 3.5" (1.613 m), weight 112.801 kg (248 lb 10.9 oz), SpO2 93.00%. No results found. Results for orders placed during the hospital encounter of 07/15/12 (from the past 72 hour(s))  GLUCOSE, CAPILLARY     Status: None   Collection Time    07/18/12  7:48 AM      Result Value Range   Glucose-Capillary 99  70 - 99 mg/dL   Comment 1 Notify RN    GLUCOSE, CAPILLARY     Status: None   Collection Time    07/18/12 11:53 AM      Result Value Range   Glucose-Capillary 98  70 - 99 mg/dL   Comment 1 Notify RN    GLUCOSE, CAPILLARY     Status: Abnormal   Collection Time    07/18/12  4:46 PM      Result Value Range   Glucose-Capillary 140 (*) 70 - 99 mg/dL   Comment 1 Notify RN    GLUCOSE, CAPILLARY     Status: Abnormal   Collection Time    07/18/12  9:12 PM      Result Value Range   Glucose-Capillary 140 (*) 70 - 99 mg/dL  GLUCOSE, CAPILLARY     Status: None   Collection Time    07/19/12  7:11 AM      Result Value Range   Glucose-Capillary 80  70 - 99 mg/dL   Comment 1 Notify RN    GLUCOSE, CAPILLARY     Status: Abnormal   Collection Time    07/19/12 11:41 AM      Result Value Range   Glucose-Capillary 115 (*) 70 - 99 mg/dL   Comment 1 Notify RN    GLUCOSE, CAPILLARY     Status: Abnormal   Collection Time    07/19/12  4:31 PM      Result Value Range   Glucose-Capillary 145 (*) 70 - 99 mg/dL   Comment 1 Notify RN    GLUCOSE, CAPILLARY     Status: Abnormal   Collection Time    07/19/12  8:18 PM   Result Value Range   Glucose-Capillary 130 (*) 70 - 99 mg/dL  GLUCOSE, CAPILLARY     Status: None   Collection Time    07/20/12  7:25 AM      Result Value Range   Glucose-Capillary 74  70 - 99 mg/dL   Comment 1 Notify RN    GLUCOSE, CAPILLARY     Status: None   Collection Time    07/20/12 11:22 AM      Result Value Range   Glucose-Capillary 99  70 - 99 mg/dL   Comment 1 Notify RN    GLUCOSE, CAPILLARY     Status: Abnormal   Collection Time    07/20/12  2:26 PM      Result Value Range   Glucose-Capillary 107 (*) 70 - 99 mg/dL   Comment 1 Notify RN    GLUCOSE, CAPILLARY     Status: Abnormal   Collection Time    07/20/12  4:15 PM      Result Value Range   Glucose-Capillary 107 (*) 70 - 99 mg/dL  GLUCOSE, CAPILLARY     Status: Abnormal   Collection Time    07/20/12  8:54 PM      Result Value Range   Glucose-Capillary 141 (*) 70 - 99 mg/dL   Comment 1 Notify RN    GLUCOSE, CAPILLARY     Status: None   Collection Time    07/21/12  7:10 AM      Result Value Range   Glucose-Capillary 78  70 - 99 mg/dL     HEENT: normal Cardio: RRR and no murmur Resp: CTA B/L and No Labored breathing GI: BS positive and obese Extremity:  Pulses positive and No Edema Skin:   Intact Neuro: Alert/Oriented, Cranial Nerve II-XII normal, Abnormal Sensory parasthesia in Legs, Abnormal Motor 4/5 in Bilateral quads TA and Gastroc, 3 to 4-/5 HF 5/5 in UEs. Good sitting balance. Tone:  Within Normal Limits Musc/Skel:  Other No MCP or PIP tenderness no RA deformities Gen NAD   Assessment/Plan: 1. Functional deficits secondary to Lumbar spinal stenosis with neurogenic claudication which require 3+ hours per day of interdisciplinary therapy in a comprehensive inpatient rehab setting. Physiatrist is providing close team supervision and 24 hour management of active medical problems listed below. Physiatrist and rehab team continue to assess barriers to discharge/monitor patient progress toward functional  and medical goals. FIM: FIM - Bathing Bathing Steps Patient Completed: Chest;Right Arm;Left Arm;Abdomen;Front perineal area;Buttocks;Right upper leg;Left upper leg;Right lower leg (including foot);Left lower leg (including foot) Bathing: 6: Assistive device (Comment)  FIM - Upper Body Dressing/Undressing Upper body dressing/undressing steps patient completed: Thread/unthread right bra strap;Thread/unthread left bra strap;Hook/unhook bra;Thread/unthread right sleeve of pullover shirt/dresss;Thread/unthread left sleeve of pullover shirt/dress;Put head through opening of pull over shirt/dress;Pull shirt over trunk Upper body dressing/undressing: 5: Supervision: Safety issues/verbal cues FIM - Lower Body Dressing/Undressing Lower body dressing/undressing steps patient completed: Thread/unthread right underwear leg;Thread/unthread left underwear leg;Pull underwear up/down;Thread/unthread right pants leg;Thread/unthread left pants leg;Pull pants up/down;Don/Doff right sock;Don/Doff left sock;Don/Doff right shoe;Don/Doff left shoe Lower body dressing/undressing: 5: Supervision: Safety issues/verbal cues  FIM - Toileting Toileting steps completed by patient: Adjust clothing prior to toileting;Performs perineal hygiene;Adjust clothing after toileting Toileting Assistive Devices: Grab bar or rail for support Toileting: 0: Activity did not occur  FIM - Radio producer Devices: Mining engineer Transfers: 0-Activity did not occur  FIM - Control and instrumentation engineer Devices: Environmental consultant;Arm rests Bed/Chair Transfer: 6: Supine > Sit: No assist;6: Sit > Supine: No assist;5: Bed > Chair or W/C: Supervision (verbal cues/safety issues);5: Chair or W/C > Bed: Supervision (verbal cues/safety issues)  FIM - Locomotion: Wheelchair Locomotion: Wheelchair: 0: Activity did not occur FIM - Locomotion: Ambulation Locomotion: Ambulation Assistive Devices: Astronomer Ambulation/Gait Assistance: 5: Supervision Locomotion: Ambulation: 5: Travels 150 ft or more with supervision/safety issues  Comprehension Comprehension Mode: Auditory Comprehension: 7-Follows complex conversation/direction: With no assist  Expression Expression Mode: Verbal Expression: 7-Expresses complex ideas: With no assist  Social Interaction Social Interaction: 7-Interacts appropriately with others - No medications needed.  Problem Solving Problem Solving: 7-Solves complex problems: Recognizes & self-corrects  Memory Memory: 7-Complete Independence: No helper  Medical Problem List and Plan:  1. DVT Prophylaxis/Anticoagulation: Pharmaceutical: Lovenox  2. Pain Management: Continue neurontin for Severe headaches and left eye pain due to facial nerve irritation. Will changed morphine to po route. Use prn severe pain. Symptoms already improving with steroids.  Max dose gabapentin, cymbalta 38m. Weaning steroids 3. Depression: Continue cymbalta as has been feeling helpless and depressed for a period of time now. She is feeling better about her situation and hopes that CIR will get her back to premorbid level of activity (prior to gamma knife procedure for meningioma)  4. Neuropsych: This patient is capable of making decisions on his/her own behalf.  5. DM type 2: will continue to monitor BS with ac/hs cbg checks.DisContinue levemir.   Continue home medications of trajenta daily. Sugars reasonable at present 6. RA: on plaquenil daily.  7. Hypothyroid: continue to supplement.  8. HTN: monitor with bid checks. Continue toprol XL and lasix. Not on   aldactone at this time but   benicar resumed.        LOS (Days) 6 A FACE TO FACE EVALUATION WAS PERFORMED  SWARTZ,ZACHARY T 07/21/2012, 7:29 AM

## 2012-07-21 NOTE — Progress Notes (Signed)
Physical Therapy Session Note  Patient Details  Name: [name] MRN: [id number] Date of Birth: 1941-01-24  Today's Date: 07/21/2012 Time: 1300-1327 Time Calculation (min): 27 min  Short Term Goals: Week 1:  PT Short Term Goal 1 (Week 1): = LTGs due to LOS  Skilled Therapeutic Interventions/Progress Updates:    Pt asleep upon therapist entering the room and continued to be lethargic during conversation in regards to medication and difficulty with her mental processing. She states she has been sleep since before lunch and had been napping in between a phone call. Clarified medication schedule with RN and notified PA and discussed with her pt's increased lethargy and decreased safety awareness due to this since yesterday afternoon. Education provided to pt on recommendation at this time for S due to varying levels of lethargy due to medication dosage and noted decreased safety awareness (see note from PM PT session yesterday) and energy conservation as being important to d/c plan. Will discuss change in plan with SW as well and recommend family education prior to d/c.   Pt request to return to bed to rest; required multiple attempts for sit to stand from chair and S to transfer back to the bed and assisted with repositioning for comfort. Pt missed rest of session - will make up if able.  Therapy Documentation Precautions:  Precautions Precautions: Fall Precaution Comments: R knee may buckle Restrictions Weight Bearing Restrictions: No General: Amount of Missed PT Time (min): 33 Minutes Missed Time Reason: Patient fatigue   Pain:  Denies current pain - reports having medication around lunchtime and confirmed with RN.  See FIM for current functional status  Therapy/Group: Individual Therapy  Canary Brim [facility] 07/21/2012, 1:32 PM

## 2012-07-21 NOTE — Progress Notes (Signed)
Occupational Therapy Session Note  Patient Details  Name: DEB LOUDIN MRN: [id number] Date of Birth: Nov 18, 1940  Today's Date: 07/21/2012 Time: 1100-1200 Time Calculation (min): 60 min  Short Term Goals: Week 1:  OT Short Term Goal 1 (Week 1): Patient will perform shower transfer with supervision OT Short Term Goal 2 (Week 1): Patient will perform toilet transfer with supervision OT Short Term Goal 3 (Week 1): Patient will perform LB dressing with min assist using AE prn OT Short Term Goal 4 (Week 1): Patient will perform UB/LB bathing with minimal assistance  Skilled Therapeutic Interventions/Progress Updates:  No complaints of pain Co-Treatment with recreational therapist focusing on functional ambulation/mobility, safety with rolling walker throughout room/hallway/ADL apartment/kitchen, overall activity tolerance/endurance, simple meal preparation in kitchen, overall activity tolerance/endurance, applying energy conservation techniques prn, functional use of bilateral UEs, sit<>stands, and dynamic standing balance/tolerance/endurance. Patient ambulated -> kitchen without a seated rest break, performed simple meal prep with 4 seated rest breaks, and therapist propelled patient back to room at end of session. Patient transferred into bed side chair and therapist left patient seated in chair with call bell, phone and lunch tray within reach.   Precautions:  Precautions Precautions: Fall Precaution Comments: R knee may buckle Restrictions Weight Bearing Restrictions: No  See FIM for current functional status  CLAY,PATRICIA 07/21/2012, 12:13 PM

## 2012-07-21 NOTE — Progress Notes (Signed)
Recreational Therapy Session Note  Patient Details  Name: KEALA DRUM MRN: [id number] Date of Birth: [birth year] Today's Date: 07/21/2012 Time:  11-12 Pain: no c/o Skilled Therapeutic Interventions/Progress Updates: Session focused on activity tolerance, ambulation with RW, kitchen safety during simple meal prep in the kitchen at supervision level.  Discussion with pt on overall safety & energy conservation techniques to utilize at home.  Pt did require multiple seated rest breaks on bar stool throughout session due to low activity tolerance.  Therapy/Group: Co-Treatment   SIMPSON,LISA 07/21/2012, 3:42 PM

## 2012-07-22 ENCOUNTER — Inpatient Hospital Stay (HOSPITAL_COMMUNITY): Payer: Medicare Other | Admitting: Occupational Therapy

## 2012-07-22 ENCOUNTER — Inpatient Hospital Stay (HOSPITAL_COMMUNITY): Payer: Medicare Other | Admitting: Physical Therapy

## 2012-07-22 ENCOUNTER — Inpatient Hospital Stay (HOSPITAL_COMMUNITY): Payer: Medicare Other

## 2012-07-22 LAB — GLUCOSE, CAPILLARY: Glucose-Capillary: 83 mg/dL (ref 70–99)

## 2012-07-22 LAB — CREATININE, SERUM
Creatinine, Ser: 1.31 mg/dL — ABNORMAL HIGH (ref 0.50–1.10)
GFR calc non Af Amer: 40 mL/min — ABNORMAL LOW (ref >90)

## 2012-07-22 MED ORDER — LOPERAMIDE HCL 2 MG PO CAPS
2.0000 mg | ORAL_CAPSULE | ORAL | Status: DC | PRN
  Filled 2012-07-22: qty 1

## 2012-07-22 NOTE — Progress Notes (Signed)
Occupational Therapy Session Note  Patient Details  Name: [name] MRN: [id number] Date of Birth: 24-Jul-1940  Today's Date: 07/22/2012  Short Term Goals: Week 1:  OT Short Term Goal 1 (Week 1): Patient will perform shower transfer with supervision OT Short Term Goal 2 (Week 1): Patient will perform toilet transfer with supervision OT Short Term Goal 3 (Week 1): Patient will perform LB dressing with min assist using AE prn OT Short Term Goal 4 (Week 1): Patient will perform UB/LB bathing with minimal assistance  Skilled Therapeutic Interventions/Progress Updates:   Session #1 4932-4199 - 55 Minutes Individual Therapy No complaints of pain Patient found seated in w/c. Patient engaged in functional ambulation to gather necessary items for ADL, patient then ambulated into bathroom for toilet transfer, toileting, shower stall transfer, and UB/LB bathing at shower level. Patient ambulated out of bathroom for grooming tasks standing at sink then to sit in w/c for UB/LB dressing. Patient functioning at an overall mod I-> supervision level for BADLs. Patients daughter and husband were present during session and have verbalized and demonstrated understanding to provide the necessary supervision assistance prn. Patient independently uses AE (sock aid, reacher, long handled sponge, and long handled shoe horn) prn to increase independence with BADLs. Recommend patient's family purchase a hip kit for home use, family aware of recommendation. At end of session patient left seated in w/c with call bell & phone within reach, daughter and husband still present in room. Notified SW that family wanted to discuss discharge criteria/information with her.   Session #2 1405-1430 - 25 Minutes Individual Therapy No complaints of pain Patient found seated in bed side chair, sleepy. Patient willing to work with therapy. Patient ambulated from room -> ADL apartment for furniture transfer and simulated shower  stall transfer using simulated shower block and shower seat. Recommend patient have shower seat adjusted to second to last setting and step into shower backwards using rolling walker. Patient ambulated from ADL apartment -> therapy gym and therapist left patient seated edge of mat for next therapy session.   Precautions:  Precautions Precautions: Fall Precaution Comments: R knee may buckle Restrictions Weight Bearing Restrictions: No  See FIM for current functional status  CLAY,PATRICIA 07/22/2012, 7:32 AM

## 2012-07-22 NOTE — Progress Notes (Signed)
Physical Therapy Session Note  Patient Details  Name: [name] MRN: [id number] Date of Birth: Apr 15, 1941  Today's Date: 07/22/2012 Time: 5093-2671 Time Calculation (min): 28 min  Short Term Goals: Week 1:  PT Short Term Goal 1 (Week 1): = LTGs due to LOS  Skilled Therapeutic Interventions/Progress Updates:    Session focused on practicing long distance gait and step up to increase pt's level of comfort and safety with return home. Pt performed step up/down with RW to simulate home entry/exit, pt needed supervision for safe proximity of feet with first attempt, second attempt pt did not require verbal cues. Ambulation x 160' with RW and supervision for safety. Pt weak and sleepy today, needed to take 2 standing rest breaks during ambulation. Pt reports she has no further questions about return home and is excited to get back home.   Therapy Documentation Precautions:  Precautions Precautions: Fall Precaution Comments: R knee may buckle Restrictions Weight Bearing Restrictions: No Pain:  no c/o pain  See FIM for current functional status  Therapy/Group: Individual Therapy  [name] 07/22/2012, 3:41 PM

## 2012-07-22 NOTE — Progress Notes (Signed)
Physical Therapy Session Note  Patient Details  Name: [name] MRN: [id number] Date of Birth: 24-May-1940  Today's Date: 07/22/2012 Time: 0830-0925 Time Calculation (min): 55 min  Short Term Goals: Week 1:  PT Short Term Goal 1 (Week 1): = LTGs due to LOS  Skilled Therapeutic Interventions/Progress Updates:    Pt with c/o nausea this AM - notified RN and ginger ale given. Family not present, so called them and they plan to come in for family education at 56 with OT. Focused on functional transfers, gait, curb step for home entry with RW, and overall activity tolerance. Pt required S for all mobility due to cues needed for safety and memory. Worked on a written list for pt and family to refer to to help with safety recommendations. Pt requiring extra time and cues for functional tasks during session and continues to be lethargic this AM despite stating she felt more rested when first getting up this morning. Returned to bed at end of session to rest for a few minutes.  Therapy Documentation Precautions:  Precautions Precautions: Fall Precaution Comments: R knee may buckle Restrictions Weight Bearing Restrictions: No    Pain: Pain Assessment Pain Score: 0-No pain    Locomotion : Ambulation Ambulation/Gait Assistance: 5: Supervision   See FIM for current functional status  Therapy/Group: Individual Therapy  Canary Brim [facility] 07/22/2012, 9:42 AM

## 2012-07-22 NOTE — Progress Notes (Signed)
Patient ID: [name], female   DOB: 29-Apr-1941, 72 y.o.   MRN: [id number] Subjective/Complaints: Complains of diarrhea. Happy with progress. Still some low back pain controlled by meds.  Review of Systems  Musculoskeletal: Positive for myalgias and joint pain.  Neurological: Positive for sensory change and focal weakness.  All other systems reviewed and are negative.    Objective: Vital Signs: Blood pressure 108/68, pulse 96, temperature 98.8 F (37.1 C), temperature source Oral, resp. rate 20, height 5' 3.5" (1.613 m), weight 112.801 kg (248 lb 10.9 oz), SpO2 94.00%. No results found. Results for orders placed during the hospital encounter of 07/15/12 (from the past 72 hour(s))  GLUCOSE, CAPILLARY     Status: Abnormal   Collection Time    07/19/12 11:41 AM      Result Value Range   Glucose-Capillary 115 (*) 70 - 99 mg/dL   Comment 1 Notify RN    GLUCOSE, CAPILLARY     Status: Abnormal   Collection Time    07/19/12  4:31 PM      Result Value Range   Glucose-Capillary 145 (*) 70 - 99 mg/dL   Comment 1 Notify RN    GLUCOSE, CAPILLARY     Status: Abnormal   Collection Time    07/19/12  8:18 PM      Result Value Range   Glucose-Capillary 130 (*) 70 - 99 mg/dL  GLUCOSE, CAPILLARY     Status: None   Collection Time    07/20/12  7:25 AM      Result Value Range   Glucose-Capillary 74  70 - 99 mg/dL   Comment 1 Notify RN    GLUCOSE, CAPILLARY     Status: None   Collection Time    07/20/12 11:22 AM      Result Value Range   Glucose-Capillary 99  70 - 99 mg/dL   Comment 1 Notify RN    GLUCOSE, CAPILLARY     Status: Abnormal   Collection Time    07/20/12  2:26 PM      Result Value Range   Glucose-Capillary 107 (*) 70 - 99 mg/dL   Comment 1 Notify RN    GLUCOSE, CAPILLARY     Status: Abnormal   Collection Time    07/20/12  4:15 PM      Result Value Range   Glucose-Capillary 107 (*) 70 - 99 mg/dL  GLUCOSE, CAPILLARY     Status: Abnormal   Collection Time   07/20/12  8:54 PM      Result Value Range   Glucose-Capillary 141 (*) 70 - 99 mg/dL   Comment 1 Notify RN    GLUCOSE, CAPILLARY     Status: None   Collection Time    07/21/12  7:10 AM      Result Value Range   Glucose-Capillary 78  70 - 99 mg/dL  GLUCOSE, CAPILLARY     Status: Abnormal   Collection Time    07/21/12 11:06 AM      Result Value Range   Glucose-Capillary 115 (*) 70 - 99 mg/dL  GLUCOSE, CAPILLARY     Status: None   Collection Time    07/21/12  4:33 PM      Result Value Range   Glucose-Capillary 97  70 - 99 mg/dL   Comment 1 Notify RN    GLUCOSE, CAPILLARY     Status: None   Collection Time    07/21/12  8:50 PM      Result Value  Range   Glucose-Capillary 88  70 - 99 mg/dL   Comment 1 Notify RN    CREATININE, SERUM     Status: Abnormal   Collection Time    07/22/12  5:55 AM      Result Value Range   Creatinine, Ser 1.31 (*) 0.50 - 1.10 mg/dL   GFR calc non Af Amer 40 (*) >90 mL/min   GFR calc Af Amer 47 (*) >90 mL/min   Comment:            The eGFR has been calculated     using the CKD EPI equation.     This calculation has not been     validated in all clinical     situations.     eGFR's persistently     <90 mL/min signify     possible Chronic Kidney Disease.     HEENT: normal Cardio: RRR and no murmur Resp: CTA B/L and No Labored breathing GI: BS positive and obese Extremity:  Pulses positive and No Edema Skin:   Intact Neuro: Alert/Oriented, Cranial Nerve II-XII normal, Abnormal Sensory parasthesia in Legs, Abnormal Motor 4/5 in Bilateral quads TA and Gastroc, 4-/5 HF 5/5 in UEs. Good sitting balance. Tone:  Within Normal Limits Musc/Skel:  Other No MCP or PIP tenderness no RA deformities Gen NAD   Assessment/Plan: 1. Functional deficits secondary to Lumbar spinal stenosis with neurogenic claudication which require 3+ hours per day of interdisciplinary therapy in a comprehensive inpatient rehab setting. Physiatrist is providing close team  supervision and 24 hour management of active medical problems listed below. Physiatrist and rehab team continue to assess barriers to discharge/monitor patient progress toward functional and medical goals. FIM: FIM - Bathing Bathing Steps Patient Completed: Chest;Right Arm;Left Arm;Abdomen;Front perineal area;Buttocks;Right upper leg;Left upper leg;Right lower leg (including foot);Left lower leg (including foot) (uses LH sponge for back, lower legs and feet) Bathing: 5: Supervision: Safety issues/verbal cues  FIM - Upper Body Dressing/Undressing Upper body dressing/undressing steps patient completed: Thread/unthread right bra strap;Thread/unthread left bra strap;Hook/unhook bra;Thread/unthread right sleeve of pullover shirt/dresss;Thread/unthread left sleeve of pullover shirt/dress;Put head through opening of pull over shirt/dress;Pull shirt over trunk Upper body dressing/undressing: 6: More than reasonable amount of time FIM - Lower Body Dressing/Undressing Lower body dressing/undressing steps patient completed: Thread/unthread right underwear leg;Thread/unthread left underwear leg;Pull underwear up/down;Thread/unthread right pants leg;Thread/unthread left pants leg;Pull pants up/down;Don/Doff right sock;Don/Doff left sock;Don/Doff right shoe;Don/Doff left shoe (uses sock aide to donn socks. Max A to donn TED hose) Lower body dressing/undressing: 6: More than reasonable amount of time  FIM - Toileting Toileting steps completed by patient: Adjust clothing prior to toileting;Performs perineal hygiene;Adjust clothing after toileting Toileting Assistive Devices: Grab bar or rail for support Toileting: 0: Activity did not occur  FIM - Radio producer Devices: Mining engineer Transfers: 0-Activity did not occur  FIM - Control and instrumentation engineer Devices: Copy: 5: Sit > Supine: Supervision (verbal cues/safety issues);5:  Chair or W/C > Bed: Supervision (verbal cues/safety issues)  FIM - Locomotion: Wheelchair Locomotion: Wheelchair: 0: Activity did not occur FIM - Locomotion: Ambulation Locomotion: Ambulation Assistive Devices: Administrator Ambulation/Gait Assistance: 5: Supervision Locomotion: Ambulation: 5: Travels 150 ft or more with supervision/safety issues  Comprehension Comprehension Mode: Auditory Comprehension: 7-Follows complex conversation/direction: With no assist  Expression Expression Mode: Verbal Expression: 7-Expresses complex ideas: With no assist  Social Interaction Social Interaction: 7-Interacts appropriately with others - No medications needed.  Problem Solving Problem Solving:  7-Solves complex problems: Recognizes & self-corrects  Memory Memory: 7-Complete Independence: No helper  Medical Problem List and Plan:  1. DVT Prophylaxis/Anticoagulation: Pharmaceutical: Lovenox  2. Pain Management: Continue neurontin for Severe headaches and left eye pain due to facial nerve irritation. Will changed morphine to po route. Use prn severe pain. Symptoms already improving with steroids. Max dose gabapentin, cymbalta 37m. Weaning steroids 3. Depression: Continue cymbalta as has been feeling helpless and depressed for a period of time now. She is feeling better about her situation and hopes that CIR will get her back to premorbid level of activity (prior to gamma knife procedure for meningioma)  4. Neuropsych: This patient is capable of making decisions on his/her own behalf.  5. DM type 2: will continue to monitor BS with ac/hs cbg checks.DisContinue levemir.   Continue home medications of trajenta daily. Sugars remain under control 6. RA: on plaquenil daily.  7. Hypothyroid: continue to supplement.  8. HTN: monitor with bid checks. Continue toprol XL and lasix. Not on   aldactone at this time but   benicar resumed. 9. Diarrhea: doesn't appear infections.   -stop simethicone,  laxatives,  -prn imodium        LOS (Days) 7 A FACE TO FACE EVALUATION WAS PERFORMED  SWARTZ,ZACHARY T 07/22/2012, 7:25 AM

## 2012-07-22 NOTE — Progress Notes (Signed)
Physical Therapy Discharge Summary  Patient Details  Name: [name] MRN: [id number] Date of Birth: 1940-12-05  Today's Date: 07/22/2012 Time: 1302-1330 Time Calculation (min): 28 min Denies pain. Individual treatment. Pt finishing up in bathroom with S using RW. Discussed practicing car transfer again, but pt reporting that when doing it with prior therapist (required min A), unable to completely set up in way her car is, but feels confident with technique educated on. Stair training for community mobility up/down steps with rails with close S; able to recall going up with "strong" leg and down with the "weaker" leg.  Patient has met 7 of 8 long term goals due to improved activity tolerance, improved balance and decreased pain.  Patient to discharge at a household ambulatory level Supervision to modified independent with RW. Pt's cognition and level of alertness fluctuates causing pt to require S for safety. Patient's family (daughter and husband) is independent to provide the necessary supervision assistance at discharge.  Reasons goals not met: Car transfer goal not met; unable to simulate exact set up and required min A. Pt educated on safe technique.  Recommendation:  Patient will benefit from ongoing skilled PT services in outpatient setting to continue to advance safe functional mobility, address ongoing impairments in gait, balance, cognition, muscular endurance, activity tolerance/endurance, and minimize fall risk.  Equipment: 20x18 w/c with basic cushion. Pt already owns RW.  Reasons for discharge: treatment goals met and discharge from hospital  Patient/family agrees with progress made and goals achieved: Yes  PT Discharge Precautions/Restrictions Precautions Precautions: Fall Restrictions Weight Bearing Restrictions: No Pain Pain Assessment Pain Assessment: No/denies pain Pain Score: 0-No pain Vision/Perception  Vision - History Baseline Vision: Wears glasses all  the time Visual History: Brain injury Patient Visual Report: Diplopia (same as admission) Perception Perception: Within Functional Limits Praxis Praxis: Intact  Cognition Overall Cognitive Status: Appears within functional limits for tasks assessed Arousal/Alertness: Lethargic Orientation Level: Oriented X4 Memory: Impaired Awareness: Impaired (slightly impaired ) Problem Solving: Appears intact Safety/Judgment: Impaired (fluctuates with fatigue/medication schedule) Comments: Due to medication schedule and level of fatigue pt requires increased supervision and cueing for safety overall with mobility. Decreased memory noted and often presents with lethargy, slow processing, and word finding difficulties. Sensation Sensation Light Touch:  (same as admission) Additional Comments: patient with complaints of numbness, stiffness, and weakness throughout bilateral hands Coordination Gross Motor Movements are Fluid and Coordinated: Yes Fine Motor Movements are Fluid and Coordinated: Yes Motor  Motor Motor: Within Functional Limits  Locomotion  Ambulation Ambulation/Gait Assistance: 5: Supervision  Trunk/Postural Assessment  Cervical Assessment Cervical Assessment: Within Functional Limits Thoracic Assessment Thoracic Assessment:  (same as admission) Lumbar Assessment Lumbar Assessment:  (same as admission)  Balance Static Sitting Balance Static Sitting - Level of Assistance: 6: Modified independent (Device/Increase time) Dynamic Sitting Balance Dynamic Sitting - Level of Assistance: 6: Modified independent (Device/Increase time) Static Standing Balance Static Standing - Level of Assistance: 5: Stand by assistance Dynamic Standing Balance Dynamic Standing - Level of Assistance: 5: Stand by assistance;6: Modified independent (Device/Increase time) (fluctuates) Extremity Assessment  RUE Assessment RUE Assessment: Exceptions to [facility] RUE AROM (degrees) RUE Overall AROM Comments:  Patient's external rotation is decreased, making it difficult for functional tasks such as washing hair, brushing hair, and donning of shirt RUE Strength RUE Overall Strength Comments: overall strength =4/5 LUE Assessment LUE Assessment: Exceptions to WFL LUE AROM (degrees) LUE Overall AROM Comments: Patients AROM is decreased, especially for external rotation, making it difficult for functional  tasks such as washing hair, brushing hair, and donning shirt LUE Strength LUE Overall Strength Comments: overall strength =3+/5 RLE Assessment RLE Assessment:  (decreased muscular endurance; 3+ to 4-/5 grossly) LLE Assessment LLE Assessment:  (decreased muscular endurance; 3+ to 4-/5 grossly)  See FIM for current functional status  Canary Brim [facility] 07/22/2012, 12:28 PM

## 2012-07-22 NOTE — Progress Notes (Signed)
Occupational Therapy Discharge Summary  Patient Details  Name: [name] MRN: [id number] Date of Birth: May 29, 1940  Today's Date: 07/22/2012  Patient has met 65 of 11 long term goals due to improved activity tolerance, improved balance, postural control, ability to compensate for deficits, functional use of  RIGHT upper and LEFT upper extremity, improved attention, improved awareness and improved coordination.  Patient to discharge at overall supervision -> mod I level.  Patient's care partner (daughter and husband) are independent to provide the necessary supervision assistance prn at discharge.    Reasons goals not met: n/a, all goals met at this time.   Recommendation: No additional occupational therapy recommended at this time.   Equipment: No equipment provided, patient has a BSC and shower seat.  Reasons for discharge: treatment goals met and discharge from hospital  Patient/family agrees with progress made and goals achieved: Yes  Precautions/Restrictions  Precautions Precautions: Fall Restrictions Weight Bearing Restrictions: No  Pain Pain Assessment Pain Assessment: No/denies pain Pain Score: 0-No pain  ADL - See FIM  Vision/Perception  Vision - History Baseline Vision: Wears glasses all the time Visual History: Brain injury Patient Visual Report: Diplopia (same as admission) Perception Perception: Within Functional Limits Praxis Praxis: Intact   Cognition Overall Cognitive Status: Appears within functional limits for tasks assessed Arousal/Alertness: Lethargic Orientation Level: Oriented X4 Memory: Impaired (slightly impaired) Awareness: Impaired (slightly impaired ) Problem Solving: Appears intact Safety/Judgment: Impaired (slightly impaired) Comments: Post brain surgery patient with complaints of some word finding issues, slower processing, decreased memory, and sleepiness  Sensation Sensation Additional Comments: patient with complaints of  numbness, stiffness, and weakness throughout bilateral hands Coordination Gross Motor Movements are Fluid and Coordinated: Yes Fine Motor Movements are Fluid and Coordinated: Yes  Motor - See Discharge Navigator  Mobility - See Discharge Navigator  Trunk/Postural Assessment - See Discharge Navigator  Balance- See Discharge Navigator  Extremity/Trunk Assessment RUE Assessment RUE Assessment: Exceptions to [facility] RUE AROM (degrees) RUE Overall AROM Comments: Patient's external rotation is decreased, making it difficult for functional tasks such as washing hair, brushing hair, and donning of shirt RUE Strength RUE Overall Strength Comments: overall strength =4/5 LUE Assessment LUE Assessment: Exceptions to WFL LUE AROM (degrees) LUE Overall AROM Comments: Patients AROM is decreased, especially for external rotation, making it difficult for functional tasks such as washing hair, brushing hair, and donning shirt LUE Strength LUE Overall Strength Comments: overall strength =3+/5  See FIM for current functional status  CLAY,PATRICIA 07/22/2012, 10:08 AM

## 2012-07-22 NOTE — Plan of Care (Signed)
Problem: RH Car Transfers Goal: LTG Patient will perform car transfers with assist (PT) LTG: Patient will perform car transfers with assistance (PT).  Outcome: Adequate for Discharge Unable to fully simulate with our simulated car; Pt and family comfortable with assist as needed. Educated on safe technique.

## 2012-07-23 LAB — GLUCOSE, CAPILLARY
Glucose-Capillary: 99 mg/dL (ref 70–99)
Glucose-Capillary: 117 mg/dL — ABNORMAL HIGH (ref 70–99)
Glucose-Capillary: 145 mg/dL — ABNORMAL HIGH (ref 70–99)

## 2012-07-23 MED ORDER — MORPHINE SULFATE 15 MG PO TABS: 15.0000 mg | ORAL_TABLET | ORAL | Status: DC | PRN

## 2012-07-23 MED ORDER — LEVOTHYROXINE SODIUM 50 MCG PO TABS: 50.0000 ug | ORAL_TABLET | Freq: Every day | ORAL | Status: DC

## 2012-07-23 MED ORDER — PREDNISONE 5 MG PO TABS: 5.0000 mg | ORAL_TABLET | Freq: Every day | ORAL | Status: DC

## 2012-07-23 MED ORDER — FUROSEMIDE 20 MG PO TABS: ORAL_TABLET | ORAL | Status: DC

## 2012-07-23 MED ORDER — FUROSEMIDE 20 MG PO TABS
20.0000 mg | ORAL_TABLET | Freq: Every day | ORAL | Status: DC
  Administered 2012-07-24: 20 mg via ORAL
  Filled 2012-07-23 (×2): qty 1

## 2012-07-23 NOTE — Progress Notes (Signed)
Patient ID: [name], female   DOB: 1940-10-23, 72 y.o.   MRN: [id number] Subjective/Complaints: Diarrhea resolved. Scheduled to go home today but may be sidetracked due to weather.  Review of Systems  Musculoskeletal: Positive for myalgias and joint pain.  Neurological: Positive for sensory change and focal weakness.  All other systems reviewed and are negative.    Objective: Vital Signs: Blood pressure 119/72, pulse 89, temperature 97.8 F (36.6 C), temperature source Oral, resp. rate 18, height 5' 3.5" (1.613 m), weight 112.801 kg (248 lb 10.9 oz), SpO2 91.00%. No results found. Results for orders placed during the hospital encounter of 07/15/12 (from the past 72 hour(s))  GLUCOSE, CAPILLARY     Status: None   Collection Time    07/20/12  7:25 AM      Result Value Range   Glucose-Capillary 74  70 - 99 mg/dL   Comment 1 Notify RN    GLUCOSE, CAPILLARY     Status: None   Collection Time    07/20/12 11:22 AM      Result Value Range   Glucose-Capillary 99  70 - 99 mg/dL   Comment 1 Notify RN    GLUCOSE, CAPILLARY     Status: Abnormal   Collection Time    07/20/12  2:26 PM      Result Value Range   Glucose-Capillary 107 (*) 70 - 99 mg/dL   Comment 1 Notify RN    GLUCOSE, CAPILLARY     Status: Abnormal   Collection Time    07/20/12  4:15 PM      Result Value Range   Glucose-Capillary 107 (*) 70 - 99 mg/dL  GLUCOSE, CAPILLARY     Status: Abnormal   Collection Time    07/20/12  8:54 PM      Result Value Range   Glucose-Capillary 141 (*) 70 - 99 mg/dL   Comment 1 Notify RN    GLUCOSE, CAPILLARY     Status: None   Collection Time    07/21/12  7:10 AM      Result Value Range   Glucose-Capillary 78  70 - 99 mg/dL  GLUCOSE, CAPILLARY     Status: Abnormal   Collection Time    07/21/12 11:06 AM      Result Value Range   Glucose-Capillary 115 (*) 70 - 99 mg/dL  GLUCOSE, CAPILLARY     Status: None   Collection Time    07/21/12  4:33 PM      Result Value Range   Glucose-Capillary 97  70 - 99 mg/dL   Comment 1 Notify RN    GLUCOSE, CAPILLARY     Status: None   Collection Time    07/21/12  8:50 PM      Result Value Range   Glucose-Capillary 88  70 - 99 mg/dL   Comment 1 Notify RN    CREATININE, SERUM     Status: Abnormal   Collection Time    07/22/12  5:55 AM      Result Value Range   Creatinine, Ser 1.31 (*) 0.50 - 1.10 mg/dL   GFR calc non Af Amer 40 (*) >90 mL/min   GFR calc Af Amer 47 (*) >90 mL/min   Comment:            The eGFR has been calculated     using the CKD EPI equation.     This calculation has not been     validated in all clinical  situations.     eGFR's persistently     <90 mL/min signify     possible Chronic Kidney Disease.  GLUCOSE, CAPILLARY     Status: None   Collection Time    07/22/12  7:23 AM      Result Value Range   Glucose-Capillary 83  70 - 99 mg/dL   Comment 1 Notify RN    GLUCOSE, CAPILLARY     Status: Abnormal   Collection Time    07/22/12 11:20 AM      Result Value Range   Glucose-Capillary 100 (*) 70 - 99 mg/dL  GLUCOSE, CAPILLARY     Status: Abnormal   Collection Time    07/22/12  4:39 PM      Result Value Range   Glucose-Capillary 112 (*) 70 - 99 mg/dL   Comment 1 Notify RN    GLUCOSE, CAPILLARY     Status: Abnormal   Collection Time    07/22/12  8:35 PM      Result Value Range   Glucose-Capillary 125 (*) 70 - 99 mg/dL     HEENT: normal Cardio: RRR and no murmur Resp: CTA B/L and No Labored breathing GI: BS positive and obese Extremity:  Pulses positive and No Edema Skin:   Intact Neuro: Alert/Oriented, Cranial Nerve II-XII normal, Abnormal Sensory parasthesia in Legs, Abnormal Motor 4/5 in Bilateral quads TA and Gastroc, 4-/5 HF 5/5 in UEs. Good sitting balance. Tone:  Within Normal Limits Musc/Skel:  Other No MCP or PIP tenderness no RA deformities Gen NAD   Assessment/Plan: 1. Functional deficits secondary to Lumbar spinal stenosis with neurogenic claudication which require  3+ hours per day of interdisciplinary therapy in a comprehensive inpatient rehab setting. Physiatrist is providing close team supervision and 24 hour management of active medical problems listed below. Physiatrist and rehab team continue to assess barriers to discharge/monitor patient progress toward functional and medical goals.  Potential dc today if it's safe for her to go home. I will see her back for pain/rehab follow up later this month.  FIM: FIM - Bathing Bathing Steps Patient Completed: Chest;Right Arm;Left Arm;Abdomen;Front perineal area;Buttocks;Right upper leg;Left upper leg;Right lower leg (including foot);Left lower leg (including foot) Bathing: 6: Assistive device (Comment)  FIM - Upper Body Dressing/Undressing Upper body dressing/undressing steps patient completed: Thread/unthread right bra strap;Thread/unthread left bra strap;Hook/unhook bra;Thread/unthread right sleeve of pullover shirt/dresss;Thread/unthread left sleeve of pullover shirt/dress;Put head through opening of pull over shirt/dress;Pull shirt over trunk Upper body dressing/undressing: 7: Complete Independence: No helper FIM - Lower Body Dressing/Undressing Lower body dressing/undressing steps patient completed: Thread/unthread right underwear leg;Thread/unthread left underwear leg;Thread/unthread right pants leg;Pull underwear up/down;Thread/unthread left pants leg;Pull pants up/down;Fasten/unfasten pants;Don/Doff right sock;Don/Doff left sock;Don/Doff right shoe;Don/Doff left shoe;Fasten/unfasten right shoe;Fasten/unfasten left shoe Lower body dressing/undressing: 5: Supervision: Safety issues/verbal cues  FIM - Toileting Toileting steps completed by patient: Adjust clothing prior to toileting;Performs perineal hygiene;Adjust clothing after toileting Toileting Assistive Devices: Grab bar or rail for support Toileting: 6: Assistive device: No helper  FIM - Radio producer Devices:  Elevated toilet seat;Walker;Grab bars Toilet Transfers: 6-Assistive device: No helper  FIM - Control and instrumentation engineer Devices: Copy: 6: Supine > Sit: No assist;6: Sit > Supine: No assist;5: Chair or W/C > Bed: Supervision (verbal cues/safety issues);5: Bed > Chair or W/C: Supervision (verbal cues/safety issues)  FIM - Locomotion: Wheelchair Locomotion: Wheelchair: 1: Total Assistance/staff pushes wheelchair (Pt<25%) FIM - Locomotion: Ambulation Locomotion: Ambulation Assistive Devices: Administrator Ambulation/Gait  Assistance: 5: Supervision Locomotion: Ambulation: 5: Travels 150 ft or more with supervision/safety issues  Comprehension Comprehension Mode: Auditory Comprehension: 7-Follows complex conversation/direction: With no assist  Expression Expression Mode: Verbal Expression: 7-Expresses complex ideas: With no assist  Social Interaction Social Interaction: 6-Interacts appropriately with others with medication or extra time (anti-anxiety, antidepressant).  Problem Solving Problem Solving: 5-Solves basic 90% of the time/requires cueing < 10% of the time  Memory Memory: 5-Recognizes or recalls 90% of the time/requires cueing < 10% of the time  Medical Problem List and Plan:  1. DVT Prophylaxis/Anticoagulation: Pharmaceutical: Lovenox  2. Pain Management: Continue neurontin for Severe headaches and left eye pain due to facial nerve irritation. Will changed morphine to po route. Use prn severe pain. Symptoms already improving with steroids. Max dose gabapentin, cymbalta 33m. Weaning steroids 3. Depression: Continue cymbalta as has been feeling helpless and depressed for a period of time now. She is feeling better about her situation and hopes that CIR will get her back to premorbid level of activity (prior to gamma knife procedure for meningioma)  4. Neuropsych: This patient is capable of making decisions on his/her own behalf.  5.  DM type 2: will continue to monitor BS with ac/hs cbg checks. DisContinued levemir.   Continue home medications of trajenta daily. Sugars remain under control. Follow up with PCP 6. RA: on plaquenil daily.  7. Hypothyroid: continue to supplement.  8. HTN: monitor with bid checks. Continue toprol XL and lasix. Not on   aldactone at this time but   benicar resumed. 9. Diarrhea: bowels didn't move yesterday  -stopped simethicone, laxatives,  -prn imodium        LOS (Days) 8 A FACE TO FACE EVALUATION WAS PERFORMED  SWARTZ,ZACHARY T 07/23/2012, 7:14 AM

## 2012-07-23 NOTE — Progress Notes (Signed)
Social Work  Discharge Note  The overall goal for the admission was met for:   Discharge location: Yes - home with husband and daughter to assist  Length of Stay: Yes - extended by one day due to weather - LOS 9 days  Discharge activity level: Yes  Home/community participation: Yes  Services provided included: MD, RD, PT, OT, SLP, RN, TR, Pharmacy and SW  Financial Services: Medicare and Private Insurance: [employer]  Follow-up services arranged: Outpatient: PT and Neuropsych Consult via [facility], DME: 20x18 [employer] w/c, basic cushion via [facility] and Patient/Family has no preference for HH/DME agencies  Comments (or additional information):  Patient/Family verbalized understanding of follow-up arrangements: Yes  Individual responsible for coordination of the follow-up plan: patient  Confirmed correct DME delivered: HOYLE, LUCY 07/23/2012    HOYLE, LUCY

## 2012-07-24 DIAGNOSIS — M48061 Spinal stenosis, lumbar region without neurogenic claudication: Secondary | ICD-10-CM

## 2012-07-24 NOTE — Progress Notes (Signed)
Patient discharged with family and d.c instructions to home about 1215. Patient denied any questions regarding D/C instructions given via Reesa Chew PA prior. Patient denied any pain. No complaints noted.

## 2012-07-24 NOTE — Plan of Care (Signed)
Problem: SCI BLADDER ELIMINATION Goal: RH STG MANAGE BLADDER WITH ASSISTANCE STG Manage Bladder -Mod I  Outcome: Not Met (add Reason) Still requires supervisiobn

## 2012-07-24 NOTE — Progress Notes (Signed)
Patient ID: [name], female   DOB: Sep 16, 1940, 72 y.o.   MRN: [id number] Subjective/Complaints: Family coming in today  Review of Systems  Musculoskeletal: Positive for myalgias and joint pain.  Neurological: Positive for sensory change and focal weakness.  All other systems reviewed and are negative.    Objective: Vital Signs: Blood pressure 115/79, pulse 93, temperature 98.8 F (37.1 C), temperature source Oral, resp. rate 18, height 5' 3.5" (1.613 m), weight 112.801 kg (248 lb 10.9 oz), SpO2 95.00%. No results found. Results for orders placed during the hospital encounter of 07/15/12 (from the past 72 hour(s))  GLUCOSE, CAPILLARY     Status: Abnormal   Collection Time    07/21/12 11:06 AM      Result Value Range   Glucose-Capillary 115 (*) 70 - 99 mg/dL  GLUCOSE, CAPILLARY     Status: None   Collection Time    07/21/12  4:33 PM      Result Value Range   Glucose-Capillary 97  70 - 99 mg/dL   Comment 1 Notify RN    GLUCOSE, CAPILLARY     Status: None   Collection Time    07/21/12  8:50 PM      Result Value Range   Glucose-Capillary 88  70 - 99 mg/dL   Comment 1 Notify RN    CREATININE, SERUM     Status: Abnormal   Collection Time    07/22/12  5:55 AM      Result Value Range   Creatinine, Ser 1.31 (*) 0.50 - 1.10 mg/dL   GFR calc non Af Amer 40 (*) >90 mL/min   GFR calc Af Amer 47 (*) >90 mL/min   Comment:            The eGFR has been calculated     using the CKD EPI equation.     This calculation has not been     validated in all clinical     situations.     eGFR's persistently     <90 mL/min signify     possible Chronic Kidney Disease.  GLUCOSE, CAPILLARY     Status: None   Collection Time    07/22/12  7:23 AM      Result Value Range   Glucose-Capillary 83  70 - 99 mg/dL   Comment 1 Notify RN    GLUCOSE, CAPILLARY     Status: Abnormal   Collection Time    07/22/12 11:20 AM      Result Value Range   Glucose-Capillary 100 (*) 70 - 99 mg/dL   GLUCOSE, CAPILLARY     Status: Abnormal   Collection Time    07/22/12  4:39 PM      Result Value Range   Glucose-Capillary 112 (*) 70 - 99 mg/dL   Comment 1 Notify RN    GLUCOSE, CAPILLARY     Status: Abnormal   Collection Time    07/22/12  8:35 PM      Result Value Range   Glucose-Capillary 125 (*) 70 - 99 mg/dL  GLUCOSE, CAPILLARY     Status: None   Collection Time    07/23/12  7:10 AM      Result Value Range   Glucose-Capillary 99  70 - 99 mg/dL   Comment 1 Notify RN    GLUCOSE, CAPILLARY     Status: Abnormal   Collection Time    07/23/12 11:16 AM      Result Value Range   Glucose-Capillary 104 (*)  70 - 99 mg/dL   Comment 1 Notify RN    GLUCOSE, CAPILLARY     Status: Abnormal   Collection Time    07/23/12  4:40 PM      Result Value Range   Glucose-Capillary 117 (*) 70 - 99 mg/dL  GLUCOSE, CAPILLARY     Status: Abnormal   Collection Time    07/23/12  9:09 PM      Result Value Range   Glucose-Capillary 145 (*) 70 - 99 mg/dL   Comment 1 Notify RN       HEENT: normal Cardio: RRR and no murmur Resp: CTA B/L and No Labored breathing GI: BS positive and obese Extremity:  Pulses positive and No Edema Skin:   Intact Neuro: Alert/Oriented, Cranial Nerve II-XII normal, Abnormal Sensory parasthesia in Legs, Abnormal Motor 4/5 in Bilateral quads TA and Gastroc, 4-/5 HF 5/5 in UEs. Good sitting balance. Tone:  Within Normal Limits Musc/Skel:  Other No MCP or PIP tenderness no RA deformities Gen NAD   Assessment/Plan: 1. Functional deficits secondary to Lumbar spinal stenosis with neurogenic claudication which require 3+ hours per day of interdisciplinary therapy in a comprehensive inpatient rehab setting. Physiatrist is providing close team supervision and 24 hour management of active medical problems listed below. Physiatrist and rehab team continue to assess barriers to discharge/monitor patient progress toward functional and medical goals.  Potential dc today if it's  safe for her to go home. I will see her back for pain/rehab follow up later this month.  FIM: FIM - Bathing Bathing Steps Patient Completed: Chest;Right Arm;Left Arm;Abdomen;Front perineal area;Buttocks;Right upper leg;Left upper leg;Right lower leg (including foot);Left lower leg (including foot) Bathing: 6: Assistive device (Comment)  FIM - Upper Body Dressing/Undressing Upper body dressing/undressing steps patient completed: Thread/unthread right bra strap;Thread/unthread left bra strap;Hook/unhook bra;Thread/unthread right sleeve of pullover shirt/dresss;Thread/unthread left sleeve of pullover shirt/dress;Put head through opening of pull over shirt/dress;Pull shirt over trunk Upper body dressing/undressing: 7: Complete Independence: No helper FIM - Lower Body Dressing/Undressing Lower body dressing/undressing steps patient completed: Thread/unthread right underwear leg;Thread/unthread left underwear leg;Thread/unthread right pants leg;Pull underwear up/down;Thread/unthread left pants leg;Pull pants up/down;Fasten/unfasten pants;Don/Doff right sock;Don/Doff left sock;Don/Doff right shoe;Don/Doff left shoe;Fasten/unfasten right shoe;Fasten/unfasten left shoe Lower body dressing/undressing: 5: Supervision: Safety issues/verbal cues  FIM - Toileting Toileting steps completed by patient: Adjust clothing prior to toileting;Performs perineal hygiene;Adjust clothing after toileting Toileting Assistive Devices: Grab bar or rail for support Toileting: 6: More than reasonable amount of time  FIM - Radio producer Devices: Elevated toilet seat;Walker;Grab bars Toilet Transfers: 5-To toilet/BSC: Supervision (verbal cues/safety issues)  FIM - Control and instrumentation engineer Devices: Copy: 6: Sit > Supine: No assist;6: Bed > Chair or W/C: No assist;4: Bed > Chair or W/C: Min A (steadying Pt. > 75%);4: Chair or W/C > Bed: Min A (steadying  Pt. > 75%);4: Sit > Supine: Min A (steadying pt. > 75%/lift 1 leg)  FIM - Locomotion: Wheelchair Locomotion: Wheelchair: 1: Total Assistance/staff pushes wheelchair (Pt<25%) FIM - Locomotion: Ambulation Locomotion: Ambulation Assistive Devices: Administrator Ambulation/Gait Assistance: 5: Supervision Locomotion: Ambulation: 5: Travels 150 ft or more with supervision/safety issues  Comprehension Comprehension Mode: Auditory Comprehension: 7-Follows complex conversation/direction: With no assist  Expression Expression Mode: Verbal Expression: 7-Expresses complex ideas: With no assist  Social Interaction Social Interaction: 6-Interacts appropriately with others with medication or extra time (anti-anxiety, antidepressant).  Problem Solving Problem Solving: 5-Solves complex 90% of the time/cues < 10% of the time  Memory Memory: 5-Recognizes or recalls 90% of the time/requires cueing < 10% of the time  Medical Problem List and Plan:  1. DVT Prophylaxis/Anticoagulation: Pharmaceutical: Lovenox  2. Pain Management: Continue neurontin for Severe headaches and left eye pain due to facial nerve irritation. Will changed morphine to po route. Use prn severe pain. Symptoms already improving with steroids. Max dose gabapentin, cymbalta 3m. Weaning steroids 3. Depression: Continue cymbalta as has been feeling helpless and depressed for a period of time now. She is feeling better about her situation and hopes that CIR will get her back to premorbid level of activity (prior to gamma knife procedure for meningioma)  4. Neuropsych: This patient is capable of making decisions on his/her own behalf.  5. DM type 2: will continue to monitor BS with ac/hs cbg checks. DisContinued levemir.   Continue home medications of trajenta daily. Sugars remain under control. Follow up with PCP 6. RA: on plaquenil daily.  7. Hypothyroid: continue to supplement.  8. HTN: monitor with bid checks. Continue toprol XL  and lasix. Not on   aldactone at this time but   benicar resumed. 9. Diarrhea: bowels didn't move yesterday  -stopped simethicone, laxatives,  -prn imodium        LOS (Days) 9 A FACE TO FACE EVALUATION WAS PERFORMED  KIRSTEINS,ANDREW E 07/24/2012, 7:32 AM

## 2012-07-26 ENCOUNTER — Ambulatory Visit: Payer: Medicare Other | Admitting: Physical Therapy

## 2012-07-27 ENCOUNTER — Telehealth: Payer: Self-pay | Admitting: Physical Medicine & Rehabilitation

## 2012-07-27 ENCOUNTER — Ambulatory Visit: Payer: Medicare Other | Attending: Physical Medicine & Rehabilitation | Admitting: Physical Therapy

## 2012-07-27 DIAGNOSIS — IMO0001 Reserved for inherently not codable concepts without codable children: Secondary | ICD-10-CM | POA: Insufficient documentation

## 2012-07-27 DIAGNOSIS — M6281 Muscle weakness (generalized): Secondary | ICD-10-CM | POA: Diagnosis not present

## 2012-07-27 DIAGNOSIS — R269 Unspecified abnormalities of gait and mobility: Secondary | ICD-10-CM | POA: Insufficient documentation

## 2012-07-27 NOTE — Telephone Encounter (Signed)
Neuro Rehab called and said that patient thought besides PT, she was supposed to be seen by Dr. Migdalia Dk.  There is no referral in system for that.  Please advise.

## 2012-07-27 NOTE — Telephone Encounter (Signed)
Disregard message.

## 2012-07-28 DIAGNOSIS — M4800 Spinal stenosis, site unspecified: Secondary | ICD-10-CM | POA: Diagnosis not present

## 2012-07-28 DIAGNOSIS — E039 Hypothyroidism, unspecified: Secondary | ICD-10-CM | POA: Diagnosis not present

## 2012-07-28 DIAGNOSIS — I10 Essential (primary) hypertension: Secondary | ICD-10-CM | POA: Diagnosis not present

## 2012-07-28 DIAGNOSIS — F3289 Other specified depressive episodes: Secondary | ICD-10-CM | POA: Diagnosis not present

## 2012-07-28 DIAGNOSIS — F329 Major depressive disorder, single episode, unspecified: Secondary | ICD-10-CM | POA: Diagnosis not present

## 2012-07-29 NOTE — Patient Care Conference (Signed)
Inpatient RehabilitationTeam Conference and Plan of Care Update Date: 07/20/2012   Time: 2:00 PM    Patient Name: [name]      Medical Record Number: [id number]  Date of Birth: 08-16-1940 Sex: Female         Room/Bed: 4004/4004-01 Payor Info: Payor: MEDICARE  Plan: MEDICARE PART A AND B  Product Type: *No Product type*     Admitting Diagnosis: spinal stenosis  Admit Date/Time:  07/15/2012 11:43 AM Admission Comments: No comment available   Primary Diagnosis:  <principal problem not specified> Principal Problem: <principal problem not specified>  Patient Active Problem List   Diagnosis Date Noted  . Spinal stenosis of lumbar region with radiculopathy 07/12/2012  . Inability to walk 07/12/2012  . Acute lumbar back pain 07/12/2012  . Asymptomatic bacteriuria 07/12/2012  . 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 Brain mass 01/28/2012  . Chest pain 01/28/2012  . Rheumatoid arthritis 01/28/2012  . Osteoarthritis 01/28/2012  . High cholesterol   . Thyroid disease   . Hypertension     Expected Discharge Date: Expected Discharge Date: 07/23/12  Team Members Present: Physician leading conference: Dr. Alger Simons Social Worker Present: Lennart Pall, LCSW Nurse Present: Janyth Pupa, RN PT Present: Canary Brim, PT OT Present: Forde Radon, OT;Patricia Lissa Hoard, OT     Current Status/Progress Goal Weekly Team Focus  Medical    severe lumbar stenosis with neurogenic claudication/ radiculopathy    control pain while maximizing strength, stability     Bowel/Bladder    Continent of bowel and bladder  Continent of bowel and bladder    Remain Continent of bowel and bladder   Swallow/Nutrition/ Hydration             ADL's    overall supervision  overall mod I  ADL retraining, functional ambulation with RW, IASLs, activity tolerance/endurance, use of AE prn to increase independence   Mobility   S to Min A overall  mod I overall; S stairs   gait, balance, strengthening, endurance, stairs   Communication             Safety/Cognition/ Behavioral Observations            Pain   prn pain meds: morphine 15 mg q3, tramadol 50-100 mg q6  Pain level less than or equal to 4  monitor   Skin   non skin issues          Rehab Goals Patient on target to meet rehab goals: Yes *See Interdisciplinary Assessment and Plan and progress notes for long and short-term goals  Barriers to Discharge: non-operative candidate, promorbid deficits    Possible Resolutions to Barriers:  education and training on proper posture, technique, pacing, etc    Discharge Planning/Teaching Needs:  Home with family who can provide assistance/ supervision      Team Discussion:  Making good gains and anticipate ready for d/c end of week.  Revisions to Treatment Plan:  None   Continued Need for Acute Rehabilitation Level of Care: The patient requires daily medical management by a physician with specialized training in physical medicine and rehabilitation for the following conditions: Daily direction of a multidisciplinary physical rehabilitation program to ensure safe treatment while eliciting the highest outcome that is of practical value to the patient.: Yes Daily medical management of patient stability for increased activity during participation in an intensive rehabilitation regime.: Yes Daily analysis of laboratory values and/or radiology reports with any subsequent need for medication adjustment  of medical intervention for : Neurological problems (dm, htn, morbid obesity)  HOYLE, LUCY 07/29/2012, 11:53 AM

## 2012-07-30 DIAGNOSIS — F4321 Adjustment disorder with depressed mood: Secondary | ICD-10-CM | POA: Diagnosis not present

## 2012-07-30 NOTE — Progress Notes (Signed)
Discharge summary # [phone]

## 2012-07-31 NOTE — Discharge Summary (Signed)
NAMENEVE, BRANSCOMB NO.:  [id number]  MEDICAL RECORD NO.:  [id number]  LOCATION:  7253                         FACILITY:  [facility]  PHYSICIAN:  Meredith Staggers, M.D.DATE OF BIRTH:  02-24-1941  DATE OF ADMISSION:  07/15/2012 DATE OF DISCHARGE:  07/24/2012                              DISCHARGE SUMMARY   DISCHARGE DIAGNOSES: 1. Lumbar spinal stenosis with neurogenic claudication. 2. Depression. 3. Diabetes mellitus type 2. 4. Rheumatoid arthritis. 5. Hypertension.  HISTORY OF PRESENT ILLNESS:  [name] is a 72 year old female with history of RA, DM with diabetic neuropathy, meningioma with resection, who was admitted on July 12, 2012, with shooting pains, bilateral lower extremity, with right lower extremity instability and difficulty walking.  The patient reported a fall 3 weeks prior to admission with soreness of left hip and left knee.  MRI of lumbar spine showed moderate-to-marked spinal stenosis at L4-L5, moderate stenosis at L3-L4, and mild-to-moderate stenosis at L2-L3.  She was evaluated by Dr. Sherwood Gambler, who recommended PT/OT for proximal lower extremity weakness as the patient with multiple comorbidities and she is not felt to be surgical candidate.  She was started on IV steroids which has helped with pain management and her overall mobility.  She continues to be limited by pain and deconditioning and Therapy Team recommended CIR for progressive therapies.  PAST MEDICAL HISTORY: 1. Diabetes mellitus. 2. Hypertension. 3. Thyroid disease. 4. RA, has been off methotrexate since October of last year. 5. Depression. 6. Meningioma resection with continued diplopia. 7. Left eye pain. 8. Headaches as well as balance deficits.  FUNCTIONAL HISTORY:  The patient was independent; however, was needing cane walker prior to admission due to balance and vision issues.  Has been sedentary, became inactive since her surgery last  fall.  FUNCTIONAL STATUS:  The patient required min-to-mod assist for transfers, min-to-mod assist for ambulating 45+ 90-feet with rolling walker, limited by pain in left lower extremity.  LABORATORY TEST:  Check of lytes from February 28th reveals sodium 136, potassium 4.0, chloride 96, CO2 of 31, BUN 39, creatinine 1.27, glucose 165.  CBC reveals hemoglobin 12.3, hematocrit 37.0, white count 11.2, platelets 418.  HOSPITAL COURSE:  [name] was admitted to Rehab on July 15, 2012, for inpatient therapies to consist of PT/OT at least 3 hours 5 days a week.  Past-admission, physiatrist, rehab RN, and therapy team have worked together to provide customized collaborative interdisciplinary care.  Rehab RN has worked with the patient on bowel and bladder program as well as assisted with med administration.  The patient's blood pressures have been monitored on b.i.d. basis and these were noted to be reasonably controlled.  Diabetes was monitored on before meal and at bedtime basis and Levemir was initially maintained due to the patient being on steroids.  She did develop GI symptoms with diarrhea as well as bloating and dyspepsia; therefore, steroids were discontinued.  The patient's blood sugars were greatly improved by time of discharge.  These were ranging at 95-120 range.  P.o. intake has been good.  Due to no evidence of fluid overload and renal insufficiency, her Aldactone and Benicar were held throughout her stay.  During the patient's stay  in Rehab, weekly team conferences were held to monitor the patient's progress, set goals, as well as discuss barriers to discharge.  The patient has made great progress during her stay.  She is currently at supervision to modified independent for household ambulation.  She does require close supervision for stair navigation. Supervision is recommended due to the patient's cognition and level of alertness that continues to fluctuate  due to her meds.  Family education was done with daughter as well as husband.  Further followup home health therapies to continue past discharge.  OT has worked with the patient on self-care tasks.  The patient is at modified independent to supervision level for ADLs.  She is showing improvement in functional use of right upper and left upper extremities as well as improvement in attention awareness as well as coordination.  The patient is discharged to home in improved condition.  DISCHARGE MEDICATIONS: 1. MSIR 15 mg p.o. q.3 hours p.r.n. pain, #30 Rx. 2. Prednisone 5 mg p.o. per day. 3. Furosemide 20 mg p.o. per day. 4. Tylenol 1000 mg p.o. b.i.d. 5. Coated aspirin 81 mg p.o. per day. 6. Lipitor 20 mg p.o. per day. 7. Cymbalta 60 mg p.o. per day. 8. Neurontin 1200 mg p.o. b.i.d. and 1800 mg p.o. at bedtime. 9. Levothyroxine 50 mcg p.o. per day. 10.Tradjenta 5 mg p.o. per day. 11.Toprol-XL 100 mg p.o. per day. 12.Multivitamin 1 p.o. per day. 13.Plaquenil 200 mg p.o. per day. 14.Benicar 40 mg p.o. per day. 15.Tramadol 50 mg p.o. q.6 hours.  DIET:  Carb modified medium with salt restrictions.  ACTIVITY:  Activity level is as tolerated with supervision.  SPECIAL INSTRUCTIONS:  Drink plenty of fluids.  Neuro outpatient rehab to begin on March 11th.  FOLLOWUP:  Follow up with Dr. Valentina Shaggy, neuropsychologist at outpatient rehab for counseling and therapy.     Reesa Chew, P.A.   ______________________________ Meredith Staggers, M.D.    PL/MEDQ  D:  07/30/2012  T:  07/30/2012  Job:  [id number]  cc:   Theda Belfast. Baird Cancer, M.D. Bo Merino, M.D.

## 2012-08-02 DIAGNOSIS — I10 Essential (primary) hypertension: Secondary | ICD-10-CM | POA: Diagnosis not present

## 2012-08-02 DIAGNOSIS — R0602 Shortness of breath: Secondary | ICD-10-CM | POA: Diagnosis not present

## 2012-08-03 ENCOUNTER — Ambulatory Visit: Payer: Medicare Other | Admitting: Physical Therapy

## 2012-08-03 DIAGNOSIS — IMO0001 Reserved for inherently not codable concepts without codable children: Secondary | ICD-10-CM | POA: Diagnosis not present

## 2012-08-03 DIAGNOSIS — M6281 Muscle weakness (generalized): Secondary | ICD-10-CM | POA: Diagnosis not present

## 2012-08-03 DIAGNOSIS — R269 Unspecified abnormalities of gait and mobility: Secondary | ICD-10-CM | POA: Diagnosis not present

## 2012-08-05 ENCOUNTER — Ambulatory Visit: Payer: Medicare Other | Admitting: Physical Therapy

## 2012-08-05 DIAGNOSIS — R269 Unspecified abnormalities of gait and mobility: Secondary | ICD-10-CM | POA: Diagnosis not present

## 2012-08-05 DIAGNOSIS — IMO0001 Reserved for inherently not codable concepts without codable children: Secondary | ICD-10-CM | POA: Diagnosis not present

## 2012-08-05 DIAGNOSIS — M6281 Muscle weakness (generalized): Secondary | ICD-10-CM | POA: Diagnosis not present

## 2012-08-09 ENCOUNTER — Ambulatory Visit: Payer: Medicare Other | Admitting: Physical Therapy

## 2012-08-09 DIAGNOSIS — M6281 Muscle weakness (generalized): Secondary | ICD-10-CM | POA: Diagnosis not present

## 2012-08-09 DIAGNOSIS — IMO0001 Reserved for inherently not codable concepts without codable children: Secondary | ICD-10-CM | POA: Diagnosis not present

## 2012-08-09 DIAGNOSIS — R269 Unspecified abnormalities of gait and mobility: Secondary | ICD-10-CM | POA: Diagnosis not present

## 2012-08-11 ENCOUNTER — Ambulatory Visit: Payer: Medicare Other | Admitting: Physical Therapy

## 2012-08-11 DIAGNOSIS — M6281 Muscle weakness (generalized): Secondary | ICD-10-CM | POA: Diagnosis not present

## 2012-08-11 DIAGNOSIS — IMO0001 Reserved for inherently not codable concepts without codable children: Secondary | ICD-10-CM | POA: Diagnosis not present

## 2012-08-11 DIAGNOSIS — R269 Unspecified abnormalities of gait and mobility: Secondary | ICD-10-CM | POA: Diagnosis not present

## 2012-08-13 ENCOUNTER — Encounter: Payer: Self-pay | Admitting: Physical Medicine & Rehabilitation

## 2012-08-13 ENCOUNTER — Encounter: Payer: Medicare Other | Attending: Physical Medicine & Rehabilitation | Admitting: Physical Medicine & Rehabilitation

## 2012-08-13 VITALS — BP 132/74 | HR 85 | Resp 16 | Ht 63.0 in | Wt 262.0 lb

## 2012-08-13 DIAGNOSIS — F329 Major depressive disorder, single episode, unspecified: Secondary | ICD-10-CM | POA: Insufficient documentation

## 2012-08-13 DIAGNOSIS — F3289 Other specified depressive episodes: Secondary | ICD-10-CM | POA: Insufficient documentation

## 2012-08-13 DIAGNOSIS — M545 Low back pain, unspecified: Secondary | ICD-10-CM | POA: Insufficient documentation

## 2012-08-13 DIAGNOSIS — IMO0002 Reserved for concepts with insufficient information to code with codable children: Secondary | ICD-10-CM

## 2012-08-13 DIAGNOSIS — I10 Essential (primary) hypertension: Secondary | ICD-10-CM | POA: Insufficient documentation

## 2012-08-13 DIAGNOSIS — R269 Unspecified abnormalities of gait and mobility: Secondary | ICD-10-CM | POA: Diagnosis not present

## 2012-08-13 DIAGNOSIS — M069 Rheumatoid arthritis, unspecified: Secondary | ICD-10-CM | POA: Insufficient documentation

## 2012-08-13 DIAGNOSIS — E119 Type 2 diabetes mellitus without complications: Secondary | ICD-10-CM | POA: Insufficient documentation

## 2012-08-13 DIAGNOSIS — M48061 Spinal stenosis, lumbar region without neurogenic claudication: Secondary | ICD-10-CM

## 2012-08-13 DIAGNOSIS — M48062 Spinal stenosis, lumbar region with neurogenic claudication: Secondary | ICD-10-CM | POA: Diagnosis not present

## 2012-08-13 DIAGNOSIS — M76899 Other specified enthesopathies of unspecified lower limb, excluding foot: Secondary | ICD-10-CM | POA: Insufficient documentation

## 2012-08-13 DIAGNOSIS — M7062 Trochanteric bursitis, left hip: Secondary | ICD-10-CM | POA: Insufficient documentation

## 2012-08-13 MED ORDER — MORPHINE SULFATE 15 MG PO TABS: 15.0000 mg | ORAL_TABLET | Freq: Four times a day (QID) | ORAL | Status: DC | PRN

## 2012-08-13 NOTE — Progress Notes (Signed)
Subjective:    Patient ID: [name], female    DOB: 09/25/1940, 72 y.o.   MRN: [id number]  HPI  [name] is back regarding her gait disorder and low back pain. She was discharged about a month ago from inpatient rehab. She is at the neuro rehab working on ROM, gait, back pain, posture, etc. Her pain remains a limiting factor at times. She finds particulary that she has pain with HF from supine and when she's in bed particularly in the left low back and leg (to knee). She just advanced to a cane yesterday.   She finds that the morphine and tramadol helps her pain the most. The gabapentin doesn't seem to do much. She takes the morphine at most twice daily---usually just once. She is taking tramadol every 6 hours. Ice helps her knees. She often sleeps on her left side at night and finds it quite difficult to get comfortable.   Pain Inventory Average Pain 5 Pain Right Now 4 My pain is sharp, stabbing and aching  In the last 24 hours, has pain interfered with the following? General activity 7 Relation with others 8 Enjoyment of life 8 What TIME of day is your pain at its worst? evening Sleep (in general) Fair  Pain is worse with: walking Pain improves with: rest, heat/ice and medication Relief from Meds: 9  Mobility use a cane use a walker how many minutes can you walk? 4 ability to climb steps?  no do you drive?  yes use a wheelchair needs help with transfers  Function retired I need assistance with the following:  meal prep, household duties and shopping Do you have any goals in this area?  yes  Neuro/Psych bladder control problems weakness numbness tingling trouble walking spasms depression  Prior Studies Any changes since last visit?  no  Physicians involved in your care Dr Baird Cancer, Dr Marquita Palms?   Family History  Problem Relation Age of Onset  . Diabetes Mother   . Hypertension Father   . Diabetes Sister   . Diabetes Brother   . Hypertension  Brother   . Heart attack Mother   . Lung cancer Father    History   Social History  . Marital Status: Married    Spouse Name: N/A    Number of Children: N/A  . Years of Education: N/A   Social History Main Topics  . Smoking status: Never Smoker   . Smokeless tobacco: None  . Alcohol Use: No  . Drug Use: No  . Sexually Active: Not Currently   Other Topics Concern  . None   Social History Narrative  . None   Past Surgical History  Procedure Laterality Date  . Abdominal hysterectomy    . Ovary surgery    . Shoulder surgery    . Brain surgery      Gamma knife 10/13. Needs repeat spring  '14   Past Medical History  Diagnosis Date  . Diabetes mellitus   . Hypertension   . Thyroid disease   . Arthritis     endstage changes bilateral knees/bilateral ankles.   . High cholesterol   . 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 02/17/2012    Continue diplopia, left eye pain and left headaches.    . Depression, reactive   . RA (rheumatoid arthritis)     has been off methotreaxte since 10/13.  . Contusion of left knee     due to fall 1/14.   BP 132/74  Pulse  85  Resp 16  Ht 5' 3" (1.6 m)  Wt 262 lb (118.842 kg)  BMI 46.42 kg/m2  SpO2 96%     Review of Systems  Constitutional: Positive for unexpected weight change.  Cardiovascular: Positive for leg swelling.  Gastrointestinal: Positive for constipation.  Genitourinary: Positive for difficulty urinating.  Musculoskeletal: Positive for gait problem.  Neurological: Positive for weakness and numbness.  Psychiatric/Behavioral: Positive for dysphoric mood.  All other systems reviewed and are negative.       Objective:   Physical Exam  General: Alert and oriented x 3, No apparent distress HEENT: Head is normocephalic, atraumatic, PERRLA, EOMI, sclera anicteric, oral mucosa pink and moist, dentition intact, ext ear canals clear,  Neck: Supple without JVD or lymphadenopathy Heart: Reg rate and  rhythm. No murmurs rubs or gallops Chest: CTA bilaterally without wheezes, rales, or rhonchi; no distress Abdomen: Soft, non-tender, non-distended, bowel sounds positive. Extremities: No clubbing, cyanosis, or edema. Pulses are 2+ Skin: Clean and intact without signs of breakdown Neuro: Pt is cognitively appropriate with normal insight, memory, and awareness. Cranial nerves 2-12 are intact. Sensory exam is normal may be slightly diminished in the left leg-but inconsistent.. Reflexes are 2+ in all 4's. Fine motor coordination is intact. No tremors. Motor function is grossly 4 to 5/5 with pain inhbition. SLR was equivocal.  Musculoskeletal: able to bend at waist to 90 degrees with a lot of effort. Left lumbar paraspinals are tender and feel tight. Extension and right bending seemed to cause the most pain today. She had pain with palpatin of the left greater troch. Cross leg maneuver was positive. She is antalgic with gait on the left more than right. Crepitus in both knees. No gross knee instability. Knee effusions noted. Psych: Pt's affect is appropriate. Pt is cooperative         Assessment & Plan:  Assessment: 1. Lumbar spinal stenosis with neurogenic claudication.  2. Depression.  3. Diabetes mellitus type 2.  4. Rheumatoid arthritis. 5. Left greater troch bursitis   Plan: 1. Continue with outpt PT to increase strength and rom as well as gait. Needs to increase flexibility. Weight loss will be important for her also. I am very pleased that she has advanced to a cane. She seems genuinely motivated to improve! 2. After informed consent and preparation of the skin with betadine, I injected 28m kenalog and 3cc of 1% lidocaine around the left greater trochanter. The patient tolerated well, and no complications were encountered. Afterward the area was cleaned and dressed. Post- injection instructions were provided. 3. Refilled MSO4 for breakthrough pain 4. Discussed the use of ice for both  her hip and her knees.  5. Follow up with me in about 2 months. 30 minutes of face to face patient care time were spent during this visit. All questions were encouraged and answered.

## 2012-08-13 NOTE — Patient Instructions (Signed)
Trochanteric Bursitis You have hip pain due to trochanteric bursitis. Bursitis means that the sack near the outside of the hip is filled with fluid and inflamed. This sack is made up of protective soft tissue. The pain from trochanteric bursitis can be severe and keep you from sleep. It can radiate to the buttocks or down the outside of the thigh to the knee. The pain is almost always worse when rising from the seated or lying position and with walking. Pain can improve after you take a few steps. It happens more often in people with hip joint and lumbar spine problems, such as arthritis or previous surgery. Very rarely the trochanteric bursa can become infected, and antibiotics and/or surgery may be needed. Treatment often includes an injection of local anesthetic mixed with cortisone medicine. This medicine is injected into the area where it is most tender over the hip. Repeat injections may be necessary if the response to treatment is slow. You can apply ice packs over the tender area for 30 minutes every 2 hours for the next few days. Anti-inflammatory and/or narcotic pain medicine may also be helpful. Limit your activity for the next few days if the pain continues. See your caregiver in 5-10 days if you are not greatly improved.  SEEK IMMEDIATE MEDICAL CARE IF:  You develop severe pain, fever, or increased redness.  You have pain that radiates below the knee. EXERCISES STRETCHING EXERCISES - Trochantic Bursitis  These exercises may help you when beginning to rehabilitate your injury. Your symptoms may resolve with or without further involvement from your physician, physical therapist or athletic trainer. While completing these exercises, remember:   Restoring tissue flexibility helps normal motion to return to the joints. This allows healthier, less painful movement and activity.  An effective stretch should be held for at least 30 seconds.  A stretch should never be painful. You should only  feel a gentle lengthening or release in the stretched tissue. STRETCH  Iliotibial Band  On the floor or bed, lie on your side so your injured leg is on top. Bend your knee and grab your ankle.  Slowly bring your knee back so that your thigh is in line with your trunk. Keep your heel at your buttocks and gently arch your back so your head, shoulders and hips line up.  Slowly lower your leg so that your knee approaches the floor/bed until you feel a gentle stretch on the outside of your thigh. If you do not feel a stretch and your knee will not fall farther, place the heel of your opposite foot on top of your knee and pull your thigh down farther.  Hold this stretch for __________ seconds.  Repeat __________ times. Complete this exercise __________ times per day. STRETCH Hamstrings, Supine   Lie on your back. Loop a belt or towel over the ball of your foot as shown.  Straighten your knee and slowly pull on the belt to raise your injured leg. Do not allow the knee to bend. Keep your opposite leg flat on the floor.  Raise the leg until you feel a gentle stretch behind your knee or thigh. Hold this position for __________ seconds.  Repeat __________ times. Complete this stretch __________ times per day. STRETCH - Quadriceps, Prone   Lie on your stomach on a firm surface, such as a bed or padded floor.  Bend your knee and grasp your ankle. If you are unable to reach, your ankle or pant leg, use a belt around  your foot to lengthen your reach.  Gently pull your heel toward your buttocks. Your knee should not slide out to the side. You should feel a stretch in the front of your thigh and/or knee.  Hold this position for __________ seconds.  Repeat __________ times. Complete this stretch __________ times per day. STRETCHING - Hip Flexors, Lunge Half kneel with your knee on the floor and your opposite knee bent and directly over your ankle.  Keep good posture with your head over your  shoulders. Tighten your buttocks to point your tailbone downward; this will prevent your back from arching too much.  You should feel a gentle stretch in the front of your thigh and/or hip. If you do not feel any resistance, slightly slide your opposite foot forward and then slowly lunge forward so your knee once again lines up over your ankle. Be sure your tailbone remains pointed downward.  Hold this stretch for __________ seconds.  Repeat __________ times. Complete this stretch __________ times per day. STRETCH - Adductors, Lunge  While standing, spread your legs  Lean away from your injured leg by bending your opposite knee. You may rest your hands on your thigh for balance.  You should feel a stretch in your inner thigh. Hold for __________ seconds.  Repeat __________ times. Complete this exercise __________ times per day. Document Released: 06/12/2004 Document Revised: 07/28/2011 Document Reviewed: 08/17/2008 [facility] Patient Information 2013 [place].

## 2012-08-16 ENCOUNTER — Ambulatory Visit: Payer: Medicare Other | Admitting: Physical Therapy

## 2012-08-16 DIAGNOSIS — IMO0001 Reserved for inherently not codable concepts without codable children: Secondary | ICD-10-CM | POA: Diagnosis not present

## 2012-08-16 DIAGNOSIS — R269 Unspecified abnormalities of gait and mobility: Secondary | ICD-10-CM | POA: Diagnosis not present

## 2012-08-16 DIAGNOSIS — M6281 Muscle weakness (generalized): Secondary | ICD-10-CM | POA: Diagnosis not present

## 2012-08-18 ENCOUNTER — Ambulatory Visit: Payer: Medicare Other | Attending: Physical Medicine & Rehabilitation | Admitting: Physical Therapy

## 2012-08-18 DIAGNOSIS — M6281 Muscle weakness (generalized): Secondary | ICD-10-CM | POA: Insufficient documentation

## 2012-08-18 DIAGNOSIS — IMO0001 Reserved for inherently not codable concepts without codable children: Secondary | ICD-10-CM | POA: Diagnosis not present

## 2012-08-18 DIAGNOSIS — R269 Unspecified abnormalities of gait and mobility: Secondary | ICD-10-CM | POA: Insufficient documentation

## 2012-08-23 ENCOUNTER — Ambulatory Visit: Payer: Medicare Other | Admitting: Physical Therapy

## 2012-08-25 ENCOUNTER — Ambulatory Visit: Payer: Medicare Other | Admitting: Physical Therapy

## 2012-08-26 ENCOUNTER — Ambulatory Visit: Payer: Medicare Other | Admitting: Physical Therapy

## 2012-08-30 ENCOUNTER — Ambulatory Visit: Payer: Medicare Other | Admitting: Physical Therapy

## 2012-09-01 ENCOUNTER — Ambulatory Visit: Payer: Medicare Other | Admitting: Physical Therapy

## 2012-09-06 ENCOUNTER — Ambulatory Visit: Payer: Medicare Other | Admitting: Physical Therapy

## 2012-09-06 DIAGNOSIS — M4800 Spinal stenosis, site unspecified: Secondary | ICD-10-CM | POA: Diagnosis not present

## 2012-09-06 DIAGNOSIS — G894 Chronic pain syndrome: Secondary | ICD-10-CM | POA: Diagnosis not present

## 2012-09-06 DIAGNOSIS — N183 Chronic kidney disease, stage 3 unspecified: Secondary | ICD-10-CM | POA: Diagnosis not present

## 2012-09-06 DIAGNOSIS — M069 Rheumatoid arthritis, unspecified: Secondary | ICD-10-CM | POA: Diagnosis not present

## 2012-09-06 DIAGNOSIS — Z0000 Encounter for general adult medical examination without abnormal findings: Secondary | ICD-10-CM | POA: Diagnosis not present

## 2012-09-06 DIAGNOSIS — F329 Major depressive disorder, single episode, unspecified: Secondary | ICD-10-CM | POA: Diagnosis not present

## 2012-09-06 DIAGNOSIS — I129 Hypertensive chronic kidney disease with stage 1 through stage 4 chronic kidney disease, or unspecified chronic kidney disease: Secondary | ICD-10-CM | POA: Diagnosis not present

## 2012-09-06 DIAGNOSIS — N3946 Mixed incontinence: Secondary | ICD-10-CM | POA: Diagnosis not present

## 2012-09-06 DIAGNOSIS — E1165 Type 2 diabetes mellitus with hyperglycemia: Secondary | ICD-10-CM | POA: Diagnosis not present

## 2012-09-06 DIAGNOSIS — F3289 Other specified depressive episodes: Secondary | ICD-10-CM | POA: Diagnosis not present

## 2012-09-06 DIAGNOSIS — E1129 Type 2 diabetes mellitus with other diabetic kidney complication: Secondary | ICD-10-CM | POA: Diagnosis not present

## 2012-09-06 DIAGNOSIS — E7800 Pure hypercholesterolemia, unspecified: Secondary | ICD-10-CM | POA: Diagnosis not present

## 2012-09-06 DIAGNOSIS — E119 Type 2 diabetes mellitus without complications: Secondary | ICD-10-CM | POA: Diagnosis not present

## 2012-09-07 DIAGNOSIS — M25569 Pain in unspecified knee: Secondary | ICD-10-CM | POA: Diagnosis not present

## 2012-09-07 DIAGNOSIS — M25579 Pain in unspecified ankle and joints of unspecified foot: Secondary | ICD-10-CM | POA: Diagnosis not present

## 2012-09-07 DIAGNOSIS — M069 Rheumatoid arthritis, unspecified: Secondary | ICD-10-CM | POA: Diagnosis not present

## 2012-09-07 DIAGNOSIS — M25549 Pain in joints of unspecified hand: Secondary | ICD-10-CM | POA: Diagnosis not present

## 2012-09-08 ENCOUNTER — Ambulatory Visit: Payer: Medicare Other | Admitting: Physical Therapy

## 2012-09-13 ENCOUNTER — Ambulatory Visit: Payer: Medicare Other | Admitting: Physical Therapy

## 2012-09-13 DIAGNOSIS — D320 Benign neoplasm of cerebral meninges: Secondary | ICD-10-CM | POA: Diagnosis not present

## 2012-09-13 DIAGNOSIS — G939 Disorder of brain, unspecified: Secondary | ICD-10-CM | POA: Diagnosis not present

## 2012-09-15 ENCOUNTER — Ambulatory Visit: Payer: Medicare Other | Admitting: Physical Therapy

## 2012-09-20 ENCOUNTER — Ambulatory Visit: Payer: Medicare Other | Attending: Physical Medicine & Rehabilitation | Admitting: Physical Therapy

## 2012-09-20 DIAGNOSIS — R269 Unspecified abnormalities of gait and mobility: Secondary | ICD-10-CM | POA: Diagnosis not present

## 2012-09-20 DIAGNOSIS — IMO0001 Reserved for inherently not codable concepts without codable children: Secondary | ICD-10-CM | POA: Diagnosis not present

## 2012-09-20 DIAGNOSIS — M6281 Muscle weakness (generalized): Secondary | ICD-10-CM | POA: Diagnosis not present

## 2012-09-23 ENCOUNTER — Ambulatory Visit: Payer: Medicare Other | Admitting: Physical Therapy

## 2012-09-23 DIAGNOSIS — IMO0001 Reserved for inherently not codable concepts without codable children: Secondary | ICD-10-CM | POA: Diagnosis not present

## 2012-09-23 DIAGNOSIS — R269 Unspecified abnormalities of gait and mobility: Secondary | ICD-10-CM | POA: Diagnosis not present

## 2012-09-23 DIAGNOSIS — M6281 Muscle weakness (generalized): Secondary | ICD-10-CM | POA: Diagnosis not present

## 2012-09-27 ENCOUNTER — Ambulatory Visit: Payer: Medicare Other | Admitting: Physical Therapy

## 2012-09-27 DIAGNOSIS — M6281 Muscle weakness (generalized): Secondary | ICD-10-CM | POA: Diagnosis not present

## 2012-09-27 DIAGNOSIS — IMO0001 Reserved for inherently not codable concepts without codable children: Secondary | ICD-10-CM | POA: Diagnosis not present

## 2012-09-27 DIAGNOSIS — R269 Unspecified abnormalities of gait and mobility: Secondary | ICD-10-CM | POA: Diagnosis not present

## 2012-09-29 ENCOUNTER — Ambulatory Visit: Payer: Medicare Other | Admitting: Physical Therapy

## 2012-09-29 DIAGNOSIS — R269 Unspecified abnormalities of gait and mobility: Secondary | ICD-10-CM | POA: Diagnosis not present

## 2012-09-29 DIAGNOSIS — IMO0001 Reserved for inherently not codable concepts without codable children: Secondary | ICD-10-CM | POA: Diagnosis not present

## 2012-09-29 DIAGNOSIS — M6281 Muscle weakness (generalized): Secondary | ICD-10-CM | POA: Diagnosis not present

## 2012-10-05 ENCOUNTER — Ambulatory Visit
Admission: RE | Admit: 2012-10-05 | Discharge: 2012-10-05 | Disposition: A | Discharge status: Home / Self Care | Payer: Medicare Other | Source: Ambulatory Visit | Attending: Nurse Practitioner | Admitting: Nurse Practitioner

## 2012-10-05 ENCOUNTER — Other Ambulatory Visit: Payer: Self-pay | Admitting: Nurse Practitioner

## 2012-10-05 ENCOUNTER — Ambulatory Visit: Payer: Medicare Other | Admitting: Physical Therapy

## 2012-10-05 DIAGNOSIS — R0989 Other specified symptoms and signs involving the circulatory and respiratory systems: Secondary | ICD-10-CM | POA: Diagnosis not present

## 2012-10-05 DIAGNOSIS — E559 Vitamin D deficiency, unspecified: Secondary | ICD-10-CM | POA: Diagnosis not present

## 2012-10-05 DIAGNOSIS — R5383 Other fatigue: Secondary | ICD-10-CM

## 2012-10-05 DIAGNOSIS — R05 Cough: Secondary | ICD-10-CM | POA: Diagnosis not present

## 2012-10-05 DIAGNOSIS — J309 Allergic rhinitis, unspecified: Secondary | ICD-10-CM | POA: Diagnosis not present

## 2012-10-05 DIAGNOSIS — R509 Fever, unspecified: Secondary | ICD-10-CM

## 2012-10-05 DIAGNOSIS — M545 Low back pain, unspecified: Secondary | ICD-10-CM | POA: Diagnosis not present

## 2012-10-05 DIAGNOSIS — I129 Hypertensive chronic kidney disease with stage 1 through stage 4 chronic kidney disease, or unspecified chronic kidney disease: Secondary | ICD-10-CM | POA: Diagnosis not present

## 2012-10-05 DIAGNOSIS — E1129 Type 2 diabetes mellitus with other diabetic kidney complication: Secondary | ICD-10-CM | POA: Diagnosis not present

## 2012-10-05 DIAGNOSIS — R059 Cough, unspecified: Secondary | ICD-10-CM | POA: Diagnosis not present

## 2012-10-05 DIAGNOSIS — E1165 Type 2 diabetes mellitus with hyperglycemia: Secondary | ICD-10-CM | POA: Diagnosis not present

## 2012-10-05 DIAGNOSIS — R0609 Other forms of dyspnea: Secondary | ICD-10-CM | POA: Diagnosis not present

## 2012-10-05 DIAGNOSIS — J40 Bronchitis, not specified as acute or chronic: Secondary | ICD-10-CM | POA: Diagnosis not present

## 2012-10-05 DIAGNOSIS — J069 Acute upper respiratory infection, unspecified: Secondary | ICD-10-CM | POA: Diagnosis not present

## 2012-10-07 ENCOUNTER — Ambulatory Visit: Payer: Medicare Other | Admitting: Physical Therapy

## 2012-10-08 DIAGNOSIS — R0609 Other forms of dyspnea: Secondary | ICD-10-CM | POA: Diagnosis not present

## 2012-10-08 DIAGNOSIS — E1129 Type 2 diabetes mellitus with other diabetic kidney complication: Secondary | ICD-10-CM | POA: Diagnosis not present

## 2012-10-08 DIAGNOSIS — E1165 Type 2 diabetes mellitus with hyperglycemia: Secondary | ICD-10-CM | POA: Diagnosis not present

## 2012-10-08 DIAGNOSIS — M545 Low back pain, unspecified: Secondary | ICD-10-CM | POA: Diagnosis not present

## 2012-10-08 DIAGNOSIS — I129 Hypertensive chronic kidney disease with stage 1 through stage 4 chronic kidney disease, or unspecified chronic kidney disease: Secondary | ICD-10-CM | POA: Diagnosis not present

## 2012-10-08 DIAGNOSIS — J40 Bronchitis, not specified as acute or chronic: Secondary | ICD-10-CM | POA: Diagnosis not present

## 2012-10-08 DIAGNOSIS — R0989 Other specified symptoms and signs involving the circulatory and respiratory systems: Secondary | ICD-10-CM | POA: Diagnosis not present

## 2012-10-12 ENCOUNTER — Encounter: Payer: Medicare Other | Admitting: Physical Medicine & Rehabilitation

## 2012-10-15 DIAGNOSIS — W010XXA Fall on same level from slipping, tripping and stumbling without subsequent striking against object, initial encounter: Secondary | ICD-10-CM | POA: Diagnosis not present

## 2012-10-15 DIAGNOSIS — J069 Acute upper respiratory infection, unspecified: Secondary | ICD-10-CM | POA: Diagnosis not present

## 2012-10-15 DIAGNOSIS — J209 Acute bronchitis, unspecified: Secondary | ICD-10-CM | POA: Diagnosis not present

## 2012-10-15 DIAGNOSIS — Z79899 Other long term (current) drug therapy: Secondary | ICD-10-CM | POA: Diagnosis not present

## 2012-10-25 ENCOUNTER — Ambulatory Visit: Payer: Medicare Other | Admitting: Physical Medicine & Rehabilitation

## 2012-10-26 ENCOUNTER — Ambulatory Visit: Payer: Medicare Other | Attending: Physical Medicine & Rehabilitation | Admitting: Physical Therapy

## 2012-10-26 DIAGNOSIS — M6281 Muscle weakness (generalized): Secondary | ICD-10-CM | POA: Insufficient documentation

## 2012-10-26 DIAGNOSIS — IMO0001 Reserved for inherently not codable concepts without codable children: Secondary | ICD-10-CM | POA: Diagnosis not present

## 2012-10-26 DIAGNOSIS — R269 Unspecified abnormalities of gait and mobility: Secondary | ICD-10-CM | POA: Diagnosis not present

## 2012-10-27 ENCOUNTER — Ambulatory Visit: Payer: Medicare Other | Admitting: Physical Therapy

## 2012-11-02 ENCOUNTER — Ambulatory Visit: Payer: Medicare Other | Admitting: Physical Therapy

## 2012-11-02 DIAGNOSIS — M949 Disorder of cartilage, unspecified: Secondary | ICD-10-CM | POA: Diagnosis not present

## 2012-11-02 DIAGNOSIS — I129 Hypertensive chronic kidney disease with stage 1 through stage 4 chronic kidney disease, or unspecified chronic kidney disease: Secondary | ICD-10-CM | POA: Diagnosis not present

## 2012-11-02 DIAGNOSIS — N182 Chronic kidney disease, stage 2 (mild): Secondary | ICD-10-CM | POA: Diagnosis not present

## 2012-11-02 DIAGNOSIS — N958 Other specified menopausal and perimenopausal disorders: Secondary | ICD-10-CM | POA: Diagnosis not present

## 2012-11-02 DIAGNOSIS — M1990 Unspecified osteoarthritis, unspecified site: Secondary | ICD-10-CM | POA: Diagnosis not present

## 2012-11-02 DIAGNOSIS — M069 Rheumatoid arthritis, unspecified: Secondary | ICD-10-CM | POA: Diagnosis not present

## 2012-11-02 DIAGNOSIS — M899 Disorder of bone, unspecified: Secondary | ICD-10-CM | POA: Diagnosis not present

## 2012-11-02 DIAGNOSIS — Z6841 Body Mass Index (BMI) 40.0 and over, adult: Secondary | ICD-10-CM | POA: Diagnosis not present

## 2012-11-04 ENCOUNTER — Ambulatory Visit: Payer: Medicare Other | Admitting: Physical Therapy

## 2012-11-04 DIAGNOSIS — M069 Rheumatoid arthritis, unspecified: Secondary | ICD-10-CM | POA: Diagnosis not present

## 2012-11-04 DIAGNOSIS — Z79899 Other long term (current) drug therapy: Secondary | ICD-10-CM | POA: Diagnosis not present

## 2012-11-09 ENCOUNTER — Ambulatory Visit: Payer: Medicare Other | Admitting: Physical Therapy

## 2012-11-09 DIAGNOSIS — M5137 Other intervertebral disc degeneration, lumbosacral region: Secondary | ICD-10-CM | POA: Diagnosis not present

## 2012-11-09 DIAGNOSIS — M069 Rheumatoid arthritis, unspecified: Secondary | ICD-10-CM | POA: Diagnosis not present

## 2012-11-09 DIAGNOSIS — M109 Gout, unspecified: Secondary | ICD-10-CM | POA: Diagnosis not present

## 2012-11-09 DIAGNOSIS — N189 Chronic kidney disease, unspecified: Secondary | ICD-10-CM | POA: Diagnosis not present

## 2012-11-10 ENCOUNTER — Encounter: Payer: Medicare Other | Attending: Physical Medicine & Rehabilitation | Admitting: Physical Medicine & Rehabilitation

## 2012-11-10 ENCOUNTER — Encounter: Payer: Self-pay | Admitting: Physical Medicine & Rehabilitation

## 2012-11-10 VITALS — BP 150/79 | HR 93 | Resp 18 | Ht 64.0 in | Wt 263.0 lb

## 2012-11-10 DIAGNOSIS — I10 Essential (primary) hypertension: Secondary | ICD-10-CM | POA: Diagnosis not present

## 2012-11-10 DIAGNOSIS — M48061 Spinal stenosis, lumbar region without neurogenic claudication: Secondary | ICD-10-CM | POA: Diagnosis not present

## 2012-11-10 DIAGNOSIS — Z5181 Encounter for therapeutic drug level monitoring: Secondary | ICD-10-CM | POA: Diagnosis not present

## 2012-11-10 DIAGNOSIS — F3289 Other specified depressive episodes: Secondary | ICD-10-CM | POA: Diagnosis not present

## 2012-11-10 DIAGNOSIS — M48062 Spinal stenosis, lumbar region with neurogenic claudication: Secondary | ICD-10-CM | POA: Diagnosis not present

## 2012-11-10 DIAGNOSIS — E119 Type 2 diabetes mellitus without complications: Secondary | ICD-10-CM | POA: Insufficient documentation

## 2012-11-10 DIAGNOSIS — F329 Major depressive disorder, single episode, unspecified: Secondary | ICD-10-CM | POA: Diagnosis not present

## 2012-11-10 DIAGNOSIS — M545 Low back pain, unspecified: Secondary | ICD-10-CM | POA: Insufficient documentation

## 2012-11-10 DIAGNOSIS — Z79899 Other long term (current) drug therapy: Secondary | ICD-10-CM | POA: Diagnosis not present

## 2012-11-10 DIAGNOSIS — M76899 Other specified enthesopathies of unspecified lower limb, excluding foot: Secondary | ICD-10-CM | POA: Insufficient documentation

## 2012-11-10 DIAGNOSIS — M069 Rheumatoid arthritis, unspecified: Secondary | ICD-10-CM | POA: Insufficient documentation

## 2012-11-10 DIAGNOSIS — IMO0002 Reserved for concepts with insufficient information to code with codable children: Secondary | ICD-10-CM

## 2012-11-10 DIAGNOSIS — M7062 Trochanteric bursitis, left hip: Secondary | ICD-10-CM

## 2012-11-10 MED ORDER — DULOXETINE HCL 60 MG PO CPEP: 60.0000 mg | ORAL_CAPSULE | Freq: Two times a day (BID) | ORAL | Status: DC

## 2012-11-10 MED ORDER — MORPHINE SULFATE 15 MG PO TABS: 15.0000 mg | ORAL_TABLET | Freq: Four times a day (QID) | ORAL | Status: DC | PRN

## 2012-11-10 NOTE — Patient Instructions (Signed)
CONTACT DR. Sherwood Gambler REGARDING A FOLLOW UP APPT/  [facility]

## 2012-11-10 NOTE — Progress Notes (Signed)
Subjective:    Patient ID: [name], female    DOB: 03/29/1941, 72 y.o.   MRN: [id number]  HPI  Mrs. Femia is back regarding her back pain. The left troch injection was very helpful but she has continued to have low back pain with radiation inter her legs. Her right knee is also giving her problems now. She wants to know if there is anything else which can be done to help this pain. She was seen by Dr. Sherwood Gambler in the hospital who felt that she was a non operative candidate.    She is taking tramadol every 6 hours. She is using MS04 on a limited basis 1-2 x per day.She is afraid to "overuse " the morphine.   She continues on high dose gabapentin tid.    She just began a new infusion for her RA--which she will receive monthly.     Pain Inventory Average Pain 6 Pain Right Now 5 My pain is intermittent and sharp  In the last 24 hours, has pain interfered with the following? General activity 3 Relation with others 3 Enjoyment of life 3 What TIME of day is your pain at its worst? morning Sleep (in general) Fair  Pain is worse with: walking, standing and some activites Pain improves with: na Relief from Meds: na  Mobility use a cane use a walker how many minutes can you walk? 5 ability to climb steps?  no do you drive?  no use a wheelchair Do you have any goals in this area?  yes  Function not employed: date last employed na I need assistance with the following:  meal prep, household duties and shopping  Neuro/Psych weakness numbness tingling trouble walking anxiety  Prior Studies Any changes since last visit?  no  Physicians involved in your care Any changes since last visit?  no   Family History  Problem Relation Age of Onset  . Diabetes Mother   . Hypertension Father   . Diabetes Sister   . Diabetes Brother   . Hypertension Brother   . Heart attack Mother   . Lung cancer Father    History   Social History  . Marital Status: Married   Spouse Name: N/A    Number of Children: N/A  . Years of Education: N/A   Social History Main Topics  . Smoking status: Never Smoker   . Smokeless tobacco: None  . Alcohol Use: No  . Drug Use: No  . Sexually Active: Not Currently   Other Topics Concern  . None   Social History Narrative  . None   Past Surgical History  Procedure Laterality Date  . Abdominal hysterectomy    . Ovary surgery    . Shoulder surgery    . Brain surgery      Gamma knife 10/13. Needs repeat spring  '14   Past Medical History  Diagnosis Date  . Diabetes mellitus   . Hypertension   . Thyroid disease   . Arthritis     endstage changes bilateral knees/bilateral ankles.   . High cholesterol   . 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 02/17/2012    Continue diplopia, left eye pain and left headaches.    . Depression, reactive   . RA (rheumatoid arthritis)     has been off methotreaxte since 10/13.  . Contusion of left knee     due to fall 1/14.   BP 150/79  Pulse 93  Resp 18  Ht _0  (  1.626 m)  Wt 263 lb (119.296 kg)  BMI 45.12 kg/m2  SpO2 93%     Review of Systems  Constitutional: Positive for unexpected weight change.  Respiratory: Positive for cough, shortness of breath and wheezing.   Cardiovascular: Positive for leg swelling.  Gastrointestinal: Positive for constipation.  Musculoskeletal: Positive for gait problem.  Neurological: Positive for weakness and numbness.       Tingling  Psychiatric/Behavioral: Positive for agitation.  All other systems reviewed and are negative.       Objective:   Physical Exam General: Alert and oriented x 3, No apparent distress  HEENT: Head is normocephalic, atraumatic, PERRLA, EOMI, sclera anicteric, oral mucosa pink and moist, dentition intact, ext ear canals clear,  Neck: Supple without JVD or lymphadenopathy  Heart: Reg rate and rhythm. No murmurs rubs or gallops  Chest: CTA bilaterally without wheezes, rales, or  rhonchi; no distress  Abdomen: Soft, non-tender, non-distended, bowel sounds positive.  Extremities: No clubbing, cyanosis, or edema. Pulses are 2+  Skin: Clean and intact without signs of breakdown  Neuro: Pt is cognitively appropriate with normal insight, memory, and awareness. Cranial nerves 2-12 are intact. Sensory exam is normal may be slightly diminished in the left leg-but inconsistent.. Reflexes are 2+ in all 4's. Fine motor coordination is intact. No tremors. Motor function is grossly 4 to 5/5 with pain inhbition. SLR was equivocal.  Musculoskeletal: able to bend at waist to 90 degrees with a lot of effort. Left lumbar paraspinals are tender and feel tight. Extension and right bending remain painful. . She had less pain with palpatin of the left greater troch. Cross leg maneuver was positive. She is antalgic with gait on the right. Mild joint effusion noted right knee.  Crepitus in both knees. No gross knee instability. Psych: Pt's affect is appropriate. Pt is cooperative   Assessment & Plan:   Assessment:  1. Lumbar spinal stenosis with neurogenic claudication.  2. Depression.  3. Diabetes mellitus type 2.  4. Rheumatoid arthritis. Multiple joint involvement including right knee 5. Left greater troch bursitis--improved.    Plan:  1. Complete PT (this finishes Thursday)---maintain a HEP. She mentioned water activities at the [facility] and I'm in total support of that. Maintaining posture and lower extremity strength is extremely important for her.  2. After informed consent and preparation of the skin with betadine, I injected 62m kenalog and 3cc of 1% lidocaine into the right knee via lateral approach. The patient tolerated well, and no complications were encountered. Afterward the area was cleaned and dressed. Post- injection instructions were provided.  3. Refilled MSO4 for breakthrough pain--asked her to be a little more liberal with this. 4. I feel that she should follow up with Dr.  NSherwood Gamblerfor at least a follow up opinion in regard to her back pain. If her pain worsens, the next step would be from our standpoint would be a long acting narcotic.  5. Increase cymbalta to 659mbid for neuropathic and axial spine pain. Asked her to watch for her any mood changes, tachycardia, etc.  5. Follow up with me in about 2 months. 30 minutes of face to face patient care time were spent during this visit. All questions were encouraged and answered.

## 2012-11-10 NOTE — Addendum Note (Signed)
Addended by: Caro Hight on: 11/10/2012 11:45 AM   Modules accepted: Orders

## 2012-11-11 ENCOUNTER — Ambulatory Visit: Payer: Medicare Other | Admitting: Physical Therapy

## 2012-11-15 ENCOUNTER — Ambulatory Visit: Payer: Medicare Other | Admitting: Physical Therapy

## 2012-11-16 ENCOUNTER — Ambulatory Visit: Payer: Medicare Other | Admitting: Physical Therapy

## 2012-11-22 ENCOUNTER — Ambulatory Visit: Payer: Medicare Other | Admitting: Physical Therapy

## 2012-11-24 ENCOUNTER — Ambulatory Visit: Payer: Medicare Other | Admitting: Physical Therapy

## 2012-11-24 DIAGNOSIS — Z79899 Other long term (current) drug therapy: Secondary | ICD-10-CM | POA: Diagnosis not present

## 2012-12-02 DIAGNOSIS — M069 Rheumatoid arthritis, unspecified: Secondary | ICD-10-CM | POA: Diagnosis not present

## 2012-12-02 DIAGNOSIS — Z79899 Other long term (current) drug therapy: Secondary | ICD-10-CM | POA: Diagnosis not present

## 2012-12-14 DIAGNOSIS — I129 Hypertensive chronic kidney disease with stage 1 through stage 4 chronic kidney disease, or unspecified chronic kidney disease: Secondary | ICD-10-CM | POA: Diagnosis not present

## 2012-12-14 DIAGNOSIS — N182 Chronic kidney disease, stage 2 (mild): Secondary | ICD-10-CM | POA: Diagnosis not present

## 2012-12-14 DIAGNOSIS — E1129 Type 2 diabetes mellitus with other diabetic kidney complication: Secondary | ICD-10-CM | POA: Diagnosis not present

## 2012-12-20 DIAGNOSIS — K625 Hemorrhage of anus and rectum: Secondary | ICD-10-CM | POA: Diagnosis not present

## 2012-12-20 DIAGNOSIS — K649 Unspecified hemorrhoids: Secondary | ICD-10-CM | POA: Diagnosis not present

## 2012-12-27 DIAGNOSIS — H43819 Vitreous degeneration, unspecified eye: Secondary | ICD-10-CM | POA: Diagnosis not present

## 2012-12-27 DIAGNOSIS — Z79899 Other long term (current) drug therapy: Secondary | ICD-10-CM | POA: Diagnosis not present

## 2012-12-27 DIAGNOSIS — M069 Rheumatoid arthritis, unspecified: Secondary | ICD-10-CM | POA: Diagnosis not present

## 2012-12-27 DIAGNOSIS — H25019 Cortical age-related cataract, unspecified eye: Secondary | ICD-10-CM | POA: Diagnosis not present

## 2012-12-27 DIAGNOSIS — E119 Type 2 diabetes mellitus without complications: Secondary | ICD-10-CM | POA: Diagnosis not present

## 2012-12-28 DIAGNOSIS — M545 Low back pain, unspecified: Secondary | ICD-10-CM | POA: Diagnosis not present

## 2012-12-28 DIAGNOSIS — Z6841 Body Mass Index (BMI) 40.0 and over, adult: Secondary | ICD-10-CM | POA: Diagnosis not present

## 2012-12-28 DIAGNOSIS — M4310 Spondylolisthesis, site unspecified: Secondary | ICD-10-CM | POA: Diagnosis not present

## 2012-12-30 DIAGNOSIS — M069 Rheumatoid arthritis, unspecified: Secondary | ICD-10-CM | POA: Diagnosis not present

## 2012-12-30 DIAGNOSIS — Z79899 Other long term (current) drug therapy: Secondary | ICD-10-CM | POA: Diagnosis not present

## 2013-01-06 DIAGNOSIS — N189 Chronic kidney disease, unspecified: Secondary | ICD-10-CM | POA: Diagnosis not present

## 2013-01-06 DIAGNOSIS — M069 Rheumatoid arthritis, unspecified: Secondary | ICD-10-CM | POA: Diagnosis not present

## 2013-01-06 DIAGNOSIS — M1A00X0 Idiopathic chronic gout, unspecified site, without tophus (tophi): Secondary | ICD-10-CM | POA: Diagnosis not present

## 2013-01-06 DIAGNOSIS — M25569 Pain in unspecified knee: Secondary | ICD-10-CM | POA: Diagnosis not present

## 2013-01-07 DIAGNOSIS — M1A00X0 Idiopathic chronic gout, unspecified site, without tophus (tophi): Secondary | ICD-10-CM | POA: Diagnosis not present

## 2013-01-07 DIAGNOSIS — Z79899 Other long term (current) drug therapy: Secondary | ICD-10-CM | POA: Diagnosis not present

## 2013-01-07 DIAGNOSIS — M1A9XX0 Chronic gout, unspecified, without tophus (tophi): Secondary | ICD-10-CM | POA: Diagnosis not present

## 2013-01-18 DIAGNOSIS — T148XXA Other injury of unspecified body region, initial encounter: Secondary | ICD-10-CM | POA: Diagnosis not present

## 2013-01-18 DIAGNOSIS — R5381 Other malaise: Secondary | ICD-10-CM | POA: Diagnosis not present

## 2013-01-18 DIAGNOSIS — E559 Vitamin D deficiency, unspecified: Secondary | ICD-10-CM | POA: Diagnosis not present

## 2013-01-18 DIAGNOSIS — S40029A Contusion of unspecified upper arm, initial encounter: Secondary | ICD-10-CM | POA: Diagnosis not present

## 2013-01-18 DIAGNOSIS — R209 Unspecified disturbances of skin sensation: Secondary | ICD-10-CM | POA: Diagnosis not present

## 2013-01-18 DIAGNOSIS — E119 Type 2 diabetes mellitus without complications: Secondary | ICD-10-CM | POA: Diagnosis not present

## 2013-01-18 DIAGNOSIS — N182 Chronic kidney disease, stage 2 (mild): Secondary | ICD-10-CM | POA: Diagnosis not present

## 2013-01-18 DIAGNOSIS — E1129 Type 2 diabetes mellitus with other diabetic kidney complication: Secondary | ICD-10-CM | POA: Diagnosis not present

## 2013-01-21 DIAGNOSIS — Z79899 Other long term (current) drug therapy: Secondary | ICD-10-CM | POA: Diagnosis not present

## 2013-01-27 DIAGNOSIS — Z79899 Other long term (current) drug therapy: Secondary | ICD-10-CM | POA: Diagnosis not present

## 2013-01-27 DIAGNOSIS — M069 Rheumatoid arthritis, unspecified: Secondary | ICD-10-CM | POA: Diagnosis not present

## 2013-02-02 ENCOUNTER — Encounter: Payer: Medicare Other | Attending: Physical Medicine & Rehabilitation | Admitting: Physical Medicine & Rehabilitation

## 2013-02-02 ENCOUNTER — Encounter: Payer: Self-pay | Admitting: Physical Medicine & Rehabilitation

## 2013-02-02 VITALS — BP 142/73 | HR 92 | Resp 16 | Ht 64.0 in | Wt 274.0 lb

## 2013-02-02 DIAGNOSIS — M069 Rheumatoid arthritis, unspecified: Secondary | ICD-10-CM

## 2013-02-02 DIAGNOSIS — I10 Essential (primary) hypertension: Secondary | ICD-10-CM | POA: Diagnosis not present

## 2013-02-02 DIAGNOSIS — M48061 Spinal stenosis, lumbar region without neurogenic claudication: Secondary | ICD-10-CM | POA: Diagnosis not present

## 2013-02-02 DIAGNOSIS — IMO0002 Reserved for concepts with insufficient information to code with codable children: Secondary | ICD-10-CM

## 2013-02-02 DIAGNOSIS — M7062 Trochanteric bursitis, left hip: Secondary | ICD-10-CM

## 2013-02-02 DIAGNOSIS — M76899 Other specified enthesopathies of unspecified lower limb, excluding foot: Secondary | ICD-10-CM

## 2013-02-02 MED ORDER — NORTRIPTYLINE HCL 10 MG PO CAPS: 10.0000 mg | ORAL_CAPSULE | Freq: Every day | ORAL | Status: DC

## 2013-02-02 MED ORDER — MORPHINE SULFATE 15 MG PO TABS: 15.0000 mg | ORAL_TABLET | Freq: Four times a day (QID) | ORAL | Status: DC | PRN

## 2013-02-02 NOTE — Patient Instructions (Signed)
CALL ME WITH ANY PROBLEMS OR QUESTIONS (#[phone]).  HAVE A GOOD DAY  

## 2013-02-02 NOTE — Progress Notes (Signed)
Subjective:    Patient ID: [name], female    DOB: 1941/05/01, 72 y.o.   MRN: [id number]  HPI  Mrs. Girdner is back regarding her chronic pain. She has been moving around more, but it tends to cause her back more pain. She has pain running down the legs with tingling and burning. NS has discussed surgery but only wants to proceed if it's emergent.   Her gabapentin has helped the symptoms, and she's tolerating the medication well. She is still taking the MS IR for breakthrough pain, typically only once or twice per day. Her pain typically is worst in the morning when she wakes up and if she does a whole lot of walking. She denies consistent numbness and weakness in the legs. The leg pain is often from the left hip into the lateral leg,knee.  She is till on her DMARD's for RA.    Pain Inventory Average Pain 8 Pain Right Now 8 My pain is intermittent, sharp and stabbing  In the last 24 hours, has pain interfered with the following? General activity 7 Relation with others 5 Enjoyment of life 8 What TIME of day is your pain at its worst? varies Sleep (in general) Fair  Pain is worse with: walking, bending, standing and some activites Pain improves with: medication Relief from Meds: 8  Mobility use a cane use a walker how many minutes can you walk? 5 do you drive?  yes transfers alone  Function retired I need assistance with the following:  meal prep, household duties and shopping  Neuro/Psych bladder control problems weakness numbness trouble walking depression anxiety  Prior Studies x-rays CT/MRI  Physicians involved in your care Primary care . Neurologist . Rheumatologist . Neurosurgeon .   Family History  Problem Relation Age of Onset  . Diabetes Mother   . Hypertension Father   . Diabetes Sister   . Diabetes Brother   . Hypertension Brother   . Heart attack Mother   . Lung cancer Father    History   Social History  . Marital Status:  Married    Spouse Name: N/A    Number of Children: N/A  . Years of Education: N/A   Social History Main Topics  . Smoking status: Never Smoker   . Smokeless tobacco: None  . Alcohol Use: No  . Drug Use: No  . Sexual Activity: Not Currently   Other Topics Concern  . None   Social History Narrative  . None   Past Surgical History  Procedure Laterality Date  . Abdominal hysterectomy    . Ovary surgery    . Shoulder surgery    . Brain surgery      Gamma knife 10/13. Needs repeat spring  '14   Past Medical History  Diagnosis Date  . Diabetes mellitus   . Hypertension   . Thyroid disease   . Arthritis     endstage changes bilateral knees/bilateral ankles.   . High cholesterol   . 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 02/17/2012    Continue diplopia, left eye pain and left headaches.    . Depression, reactive   . RA (rheumatoid arthritis)     has been off methotreaxte since 10/13.  . Contusion of left knee     due to fall 1/14.  [name] URI (upper respiratory infection)    BP 142/73  Pulse 92  Resp 16  Ht _0  (1.626 m)  Wt 274 lb (124.286 kg)  BMI  47.01 kg/m2  SpO2 94%     Review of Systems  Constitutional: Positive for diaphoresis and unexpected weight change.  Respiratory: Positive for cough and shortness of breath.   Cardiovascular: Positive for leg swelling.  Genitourinary: Positive for difficulty urinating.  Musculoskeletal: Positive for myalgias, arthralgias and gait problem.  Neurological: Positive for weakness and numbness.  Psychiatric/Behavioral: Positive for dysphoric mood. The patient is nervous/anxious.   All other systems reviewed and are negative.       Objective:   Physical Exam  General: Alert and oriented x 3, No apparent distress  HEENT: Head is normocephalic, atraumatic, PERRLA, EOMI, sclera anicteric, oral mucosa pink and moist, dentition intact, ext ear canals clear,  Neck: Supple without JVD or lymphadenopathy   Heart: Reg rate and rhythm. No murmurs rubs or gallops  Chest: CTA bilaterally without wheezes, rales, or rhonchi; no distress  Abdomen: Soft, non-tender, non-distended, bowel sounds positive.  Extremities: No clubbing, cyanosis, or edema. Pulses are 2+  Skin: Clean and intact without signs of breakdown  Neuro: Pt is cognitively appropriate with normal insight, memory, and awareness. Cranial nerves 2-12 are intact. Sensory exam is near normal in the legs... Reflexes are 2+ in all 4's. Fine motor coordination is intact. No tremors. Motor function is grossly 4 to 5/5 with pain inhbition. SLR was equivocal.  Musculoskeletal: able to bend at waist to 70 degrees with a lot of effort. Left lumbar paraspinals are tender and feel tight. Extension and right bending remain painful. . She had   pain with palpation of the left greater troch and increase pain with cross leg maneuver.  She is antalgic with gait on the right. Mild joint effusion noted right knee. Crepitus in both knees. No gross knee instability. She needed to extra time to stand from sitting.  Psych: Pt's affect is appropriate. Pt is cooperative  Assessment & Plan:   Assessment:  1. Lumbar spinal stenosis with neurogenic claudication.  2. Depression.  3. Diabetes mellitus type 2.  4. Rheumatoid arthritis. Multiple joint involvement including right knee  5. Left greater troch bursitis  Plan:  1. Maintain activity and ROM as possible. This is crucial.  2. After informed consent and preparation of the skin with betadine, I injected 53m and 3cc of 1% lidocaine around the left greater troch via lateral approach. The patient tolerated well, and no complications were encountered. Afterward the area was cleaned and dressed. Post- injection instructions were provided.   3. Refilled MSO4 for breakthrough pain--she is using this appropriately.  4.Continue per NS recs for back pain. She is only a candidate if things should substantially wrsen. 5.  Continue  cymbalta to 633mbid for neuropathic and axial spine pain.  5. Will begin a trial of pamelor 10-2053mhs for neuropathic pain and sleep. 6. Follow up with me in about 2 months. 30 minutes of face to face patient care time were spent during this visit. All questions were encouraged and answered.

## 2013-02-23 DIAGNOSIS — D332 Benign neoplasm of brain, unspecified: Secondary | ICD-10-CM | POA: Diagnosis not present

## 2013-02-23 DIAGNOSIS — R0602 Shortness of breath: Secondary | ICD-10-CM | POA: Diagnosis not present

## 2013-02-23 DIAGNOSIS — J9819 Other pulmonary collapse: Secondary | ICD-10-CM | POA: Diagnosis not present

## 2013-02-23 DIAGNOSIS — R0789 Other chest pain: Secondary | ICD-10-CM | POA: Diagnosis not present

## 2013-02-25 DIAGNOSIS — M069 Rheumatoid arthritis, unspecified: Secondary | ICD-10-CM | POA: Diagnosis not present

## 2013-02-25 DIAGNOSIS — Z79899 Other long term (current) drug therapy: Secondary | ICD-10-CM | POA: Diagnosis not present

## 2013-03-10 DIAGNOSIS — M25569 Pain in unspecified knee: Secondary | ICD-10-CM | POA: Diagnosis not present

## 2013-03-10 DIAGNOSIS — Z79899 Other long term (current) drug therapy: Secondary | ICD-10-CM | POA: Diagnosis not present

## 2013-03-10 DIAGNOSIS — M069 Rheumatoid arthritis, unspecified: Secondary | ICD-10-CM | POA: Diagnosis not present

## 2013-03-10 DIAGNOSIS — M5137 Other intervertebral disc degeneration, lumbosacral region: Secondary | ICD-10-CM | POA: Diagnosis not present

## 2013-03-14 DIAGNOSIS — M2550 Pain in unspecified joint: Secondary | ICD-10-CM | POA: Diagnosis not present

## 2013-03-14 DIAGNOSIS — Z79899 Other long term (current) drug therapy: Secondary | ICD-10-CM | POA: Diagnosis not present

## 2013-03-20 ENCOUNTER — Encounter (HOSPITAL_COMMUNITY): Payer: Self-pay | Admitting: Emergency Medicine

## 2013-03-20 ENCOUNTER — Other Ambulatory Visit: Payer: Self-pay

## 2013-03-20 ENCOUNTER — Emergency Department (INDEPENDENT_AMBULATORY_CARE_PROVIDER_SITE_OTHER): Payer: Medicare Other

## 2013-03-20 ENCOUNTER — Emergency Department (HOSPITAL_COMMUNITY)
Admission: EM | Admit: 2013-03-20 | Discharge: 2013-03-20 | Disposition: A | Discharge status: Home / Self Care | Payer: Medicare Other | Attending: Emergency Medicine | Admitting: Emergency Medicine

## 2013-03-20 ENCOUNTER — Emergency Department (INDEPENDENT_AMBULATORY_CARE_PROVIDER_SITE_OTHER)
Admission: EM | Admit: 2013-03-20 | Discharge: 2013-03-20 | Disposition: A | Discharge status: Nursing Home (Includes ICF) | Payer: Medicare Other | Source: Home / Self Care | Attending: Emergency Medicine | Admitting: Emergency Medicine

## 2013-03-20 DIAGNOSIS — E079 Disorder of thyroid, unspecified: Secondary | ICD-10-CM | POA: Insufficient documentation

## 2013-03-20 DIAGNOSIS — R0600 Dyspnea, unspecified: Secondary | ICD-10-CM

## 2013-03-20 DIAGNOSIS — R062 Wheezing: Secondary | ICD-10-CM | POA: Diagnosis not present

## 2013-03-20 DIAGNOSIS — D320 Benign neoplasm of cerebral meninges: Secondary | ICD-10-CM | POA: Insufficient documentation

## 2013-03-20 DIAGNOSIS — R0989 Other specified symptoms and signs involving the circulatory and respiratory systems: Secondary | ICD-10-CM | POA: Diagnosis not present

## 2013-03-20 DIAGNOSIS — M129 Arthropathy, unspecified: Secondary | ICD-10-CM | POA: Diagnosis not present

## 2013-03-20 DIAGNOSIS — I10 Essential (primary) hypertension: Secondary | ICD-10-CM | POA: Insufficient documentation

## 2013-03-20 DIAGNOSIS — J9801 Acute bronchospasm: Secondary | ICD-10-CM

## 2013-03-20 DIAGNOSIS — R609 Edema, unspecified: Secondary | ICD-10-CM

## 2013-03-20 DIAGNOSIS — Z7982 Long term (current) use of aspirin: Secondary | ICD-10-CM | POA: Diagnosis not present

## 2013-03-20 DIAGNOSIS — J811 Chronic pulmonary edema: Secondary | ICD-10-CM | POA: Diagnosis not present

## 2013-03-20 DIAGNOSIS — R0609 Other forms of dyspnea: Secondary | ICD-10-CM

## 2013-03-20 DIAGNOSIS — M069 Rheumatoid arthritis, unspecified: Secondary | ICD-10-CM | POA: Insufficient documentation

## 2013-03-20 DIAGNOSIS — Z885 Allergy status to narcotic agent status: Secondary | ICD-10-CM | POA: Insufficient documentation

## 2013-03-20 DIAGNOSIS — E669 Obesity, unspecified: Secondary | ICD-10-CM | POA: Diagnosis not present

## 2013-03-20 DIAGNOSIS — E7800 Pure hypercholesterolemia, unspecified: Secondary | ICD-10-CM | POA: Insufficient documentation

## 2013-03-20 DIAGNOSIS — Z888 Allergy status to other drugs, medicaments and biological substances status: Secondary | ICD-10-CM | POA: Diagnosis not present

## 2013-03-20 DIAGNOSIS — Z79899 Other long term (current) drug therapy: Secondary | ICD-10-CM | POA: Diagnosis not present

## 2013-03-20 DIAGNOSIS — R0602 Shortness of breath: Secondary | ICD-10-CM | POA: Diagnosis not present

## 2013-03-20 DIAGNOSIS — E119 Type 2 diabetes mellitus without complications: Secondary | ICD-10-CM | POA: Diagnosis not present

## 2013-03-20 DIAGNOSIS — J069 Acute upper respiratory infection, unspecified: Secondary | ICD-10-CM | POA: Diagnosis not present

## 2013-03-20 DIAGNOSIS — F341 Dysthymic disorder: Secondary | ICD-10-CM | POA: Diagnosis not present

## 2013-03-20 LAB — BASIC METABOLIC PANEL
BUN: 11 mg/dL (ref 6–23)
CO2: 28 mEq/L (ref 19–32)
Calcium: 8.9 mg/dL (ref 8.4–10.5)
Chloride: 103 mEq/L (ref 96–112)
Creatinine, Ser: 0.94 mg/dL (ref 0.50–1.10)
Glucose, Bld: 104 mg/dL — ABNORMAL HIGH (ref 70–99)

## 2013-03-20 LAB — CBC
HCT: 39 % (ref 36.0–46.0)
Hemoglobin: 13 g/dL (ref 12.0–15.0)
MCH: 32.7 pg (ref 26.0–34.0)
MCV: 98 fL (ref 78.0–100.0)
Platelets: 130 10*3/uL — ABNORMAL LOW (ref 150–400)
RBC: 3.98 MIL/uL (ref 3.87–5.11)

## 2013-03-20 MED ORDER — TRAMADOL HCL 50 MG PO TABS
50.0000 mg | ORAL_TABLET | Freq: Once | ORAL | Status: AC
  Administered 2013-03-20: 50 mg via ORAL
  Filled 2013-03-20: qty 1

## 2013-03-20 MED ORDER — IPRATROPIUM BROMIDE 0.02 % IN SOLN
RESPIRATORY_TRACT | Status: AC
  Filled 2013-03-20: qty 2.5

## 2013-03-20 MED ORDER — FUROSEMIDE 10 MG/ML IJ SOLN
40.0000 mg | Freq: Once | INTRAMUSCULAR | Status: AC
  Administered 2013-03-20: 40 mg via INTRAVENOUS
  Filled 2013-03-20: qty 4

## 2013-03-20 MED ORDER — IPRATROPIUM BROMIDE 0.02 % IN SOLN
0.5000 mg | Freq: Once | RESPIRATORY_TRACT | Status: AC
  Administered 2013-03-20: 0.5 mg via RESPIRATORY_TRACT

## 2013-03-20 MED ORDER — AZITHROMYCIN 250 MG PO TABS: ORAL_TABLET | ORAL | Status: DC

## 2013-03-20 MED ORDER — POTASSIUM CHLORIDE 20 MEQ/15ML (10%) PO LIQD
60.0000 meq | Freq: Once | ORAL | Status: AC
  Administered 2013-03-20: 60 meq via ORAL
  Filled 2013-03-20: qty 45

## 2013-03-20 MED ORDER — ALBUTEROL SULFATE (5 MG/ML) 0.5% IN NEBU
INHALATION_SOLUTION | RESPIRATORY_TRACT | Status: AC
  Filled 2013-03-20: qty 1

## 2013-03-20 MED ORDER — ALBUTEROL SULFATE HFA 108 (90 BASE) MCG/ACT IN AERS
6.0000 | INHALATION_SPRAY | Freq: Once | RESPIRATORY_TRACT | Status: AC
  Administered 2013-03-20: 6 via RESPIRATORY_TRACT
  Filled 2013-03-20: qty 6.7

## 2013-03-20 MED ORDER — ALBUTEROL SULFATE (5 MG/ML) 0.5% IN NEBU
5.0000 mg | INHALATION_SOLUTION | Freq: Once | RESPIRATORY_TRACT | Status: AC
  Administered 2013-03-20: 5 mg via RESPIRATORY_TRACT

## 2013-03-20 NOTE — ED Provider Notes (Signed)
CSN: [id number]     Arrival date & time 03/20/13  1226 History   None    Chief Complaint  Patient presents with  . Cough   (Consider location/radiation/quality/duration/timing/severity/associated sxs/prior Treatment) HPI Comments: 72 year old obese female complaining of productive cough with shortness of breath progressive over the past 3 days. She states it is gradually getting worse. She has been taking Mucinex to help loosen some of the phlegm. She has no known history of lung disease however her cardiologist told her that she did have a a diminished pulmonary function. Denies fever. She has a history of bronchitis and pneumonia for which she has had to take "strong and double ABX for cure"   Past Medical History  Diagnosis Date  . Diabetes mellitus   . Hypertension   . Thyroid disease   . Arthritis     endstage changes bilateral knees/bilateral ankles.   . High cholesterol   . 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 02/17/2012    Continue diplopia, left eye pain and left headaches.    . Depression, reactive   . RA (rheumatoid arthritis)     has been off methotreaxte since 10/13.  . Contusion of left knee     due to fall 1/14.  [name] URI (upper respiratory infection)    Past Surgical History  Procedure Laterality Date  . Abdominal hysterectomy    . Ovary surgery    . Shoulder surgery    . Brain surgery      Gamma knife 10/13. Needs repeat spring  '14   Family History  Problem Relation Age of Onset  . Diabetes Mother   . Hypertension Father   . Diabetes Sister   . Diabetes Brother   . Hypertension Brother   . Heart attack Mother   . Lung cancer Father    History  Substance Use Topics  . Smoking status: Never Smoker   . Smokeless tobacco: Not on file  . Alcohol Use: No   OB History   Grav Para Term Preterm Abortions TAB SAB Ect Mult Living                 Review of Systems  Constitutional: Positive for activity change and fatigue. Negative for  fever and diaphoresis.  HENT: Positive for postnasal drip and rhinorrhea.   Respiratory: Positive for cough, shortness of breath and wheezing. Negative for chest tightness.   Cardiovascular: Positive for leg swelling. Negative for chest pain and palpitations.  Genitourinary: Negative.   Musculoskeletal:       Mild increase in lower extremity edema in the past 3-4 days.  Skin: Positive for rash.    Allergies  Codeine; Remicade; and Zestril  Home Medications   Current Outpatient Rx  Name  Route  Sig  Dispense  Refill  . acetaminophen (TYLENOL) 500 MG tablet   Oral   Take 1,000 mg by mouth 2 (two) times daily. Takes with tramadol         . atorvastatin (LIPITOR) 20 MG tablet   Oral   Take 20 mg by mouth daily.         . Calcium Carb-Cholecalciferol (CALCIUM + D3 PO)   Oral   Take 2 tablets by mouth daily.         . DULoxetine (CYMBALTA) 60 MG capsule   Oral   Take 1 capsule (60 mg total) by mouth 2 (two) times daily.   60 capsule   4   . gabapentin (NEURONTIN) 600 MG  tablet   Oral   Take 1,200-1,800 mg by mouth 3 (three) times daily. Take 2 tablets in the morning, 2 tablets in the middle of the day, and 3 tablets at night         . hydroxychloroquine (PLAQUENIL) 200 MG tablet   Oral   Take 200 mg by mouth 2 (two) times daily.          [name] levothyroxine (SYNTHROID, LEVOTHROID) 50 MCG tablet   Oral   Take 1 tablet (50 mcg total) by mouth daily.   30 tablet   1   . linagliptin (TRADJENTA) 5 MG TABS tablet   Oral   Take 5 mg by mouth daily.         . metoprolol succinate (TOPROL-XL) 100 MG 24 hr tablet   Oral   Take 100 mg by mouth daily. Take with or immediately following a meal.         . morphine (MSIR) 15 MG tablet   Oral   Take 1 tablet (15 mg total) by mouth every 6 (six) hours as needed.   60 tablet   0   . Multiple Vitamin (MULTIVITAMIN WITH MINERALS) TABS   Oral   Take 1 tablet by mouth daily.         . nitroGLYCERIN (NITROSTAT) 0.4  MG SL tablet   Sublingual   Place 0.4 mg under the tongue every 5 (five) minutes as needed for chest pain.         . nortriptyline (PAMELOR) 10 MG capsule   Oral   Take 1-2 capsules (10-20 mg total) by mouth at bedtime. For sleep, leg pain   60 capsule   3   . olmesartan (BENICAR) 40 MG tablet   Oral   Take 40 mg by mouth daily.         . predniSONE (DELTASONE) 5 MG tablet   Oral   Take 1 tablet (5 mg total) by mouth daily with breakfast.         . traMADol (ULTRAM) 50 MG tablet   Oral   Take 50 mg by mouth every 6 (six) hours as needed for pain.         [name] UNABLE TO FIND      Atemira-IV solution monthly          BP 177/90  Pulse 86  Temp(Src) 97.7 F (36.5 C) (Oral)  Resp 25  SpO2 93% Physical Exam  Nursing note and vitals reviewed. Constitutional: She is oriented to person, place, and time. She appears well-developed and well-nourished. No distress.  HENT:  Right Ear: External ear normal.  Left Ear: External ear normal.  Mouth/Throat: Oropharynx is clear and moist. No oropharyngeal exudate.  Eyes: Conjunctivae and EOM are normal.  Neck: Normal range of motion. Neck supple.  Cardiovascular: Normal rate, regular rhythm and normal heart sounds.   Pulmonary/Chest: She has wheezes. She has no rales.  Mildly increased respiratory effort. Prolonged expiratory phase. Diffuse, scattered wheezes bilaterally with diminished air flow and breath sounds in the lower fields.  Lymphadenopathy:    She has no cervical adenopathy.  Neurological: She is alert and oriented to person, place, and time. She exhibits normal muscle tone.  Skin: Skin is warm and dry.  Psychiatric: She has a normal mood and affect.    ED Course  Procedures (including critical care time) Labs Review Labs Reviewed - No data to display Imaging Review Dg Chest 2 View  03/20/2013   CLINICAL DATA:  Shortness of breath  EXAM: CHEST  2 VIEW  COMPARISON:  10/05/2012  FINDINGS: Cardiac shadow is mildly  prominent when compare with the prior exam. Interstitial changes are seen consistent with mild congestive failure. No focal confluent infiltrate is seen. No sizable effusion is noted.  IMPRESSION: Interstitial changes consistent with mild edema.   Electronically Signed   By: Inez Catalina M.D.   On: 03/20/2013 14:32      MDM   1. Dyspnea   2. Bronchospasm   3. Pulmonary edema   4. Peripheral edema       Patient states she is breathing better after the DuoNeb. She notes that she has also gained approximately 3-4 pounds this week, that in addition to increasing peripheral edema and the chest x-ray as above showing mild pulmonary edema she will be sent to the emergency department for further evaluation. She is stable and may go by shuttle.  Janne Napoleon, NP 03/20/13 [phone]

## 2013-03-20 NOTE — ED Notes (Signed)
[facility]. Having productive cough and sob x 3 days. Reports swelling and edema to extremities. Chest xray done at [facility], showed pulmonary edema. Airway intact, ekg done at triage.

## 2013-03-20 NOTE — ED Provider Notes (Signed)
CSN: [id number]     Arrival date & time 03/20/13  1527 History   First MD Initiated Contact with Patient 03/20/13 1733     Chief Complaint  Patient presents with  . Shortness of Breath  . Cough   (Consider location/radiation/quality/duration/timing/severity/associated sxs/prior Treatment) Patient is a 72 y.o. female presenting with shortness of breath and URI. The history is provided by the patient. No language interpreter was used.  Shortness of Breath Severity:  Moderate Onset quality:  Gradual Duration:  3 days Timing:  Constant Progression:  Worsening Chronicity:  New Relieved by:  Rest Worsened by:  Exertion and movement Ineffective treatments: mucinex. Associated symptoms: cough, sore throat and sputum production   Associated symptoms: no abdominal pain, no chest pain, no claudication, no diaphoresis, no fever, no headaches, no neck pain, no syncope, no swollen glands, no vomiting and no wheezing   Cough:    Cough characteristics:  Productive   Sputum characteristics:  Clear   Severity:  Moderate   Onset quality:  Gradual   Duration:  3 days   Timing:  Constant   Progression:  Worsening   Chronicity:  New Risk factors: obesity   Risk factors: no recent alcohol use, no family hx of DVT, no hx of cancer, no hx of PE/DVT, no oral contraceptive use, no prolonged immobilization, no recent surgery and no tobacco use   URI Presenting symptoms: congestion, cough and sore throat   Presenting symptoms: no fatigue, no fever and no rhinorrhea   Severity:  Moderate Onset quality:  Gradual Duration:  3 days Timing:  Constant Progression:  Worsening Chronicity:  New Associated symptoms: no arthralgias, no headaches, no myalgias, no neck pain, no sinus pain, no sneezing, no swollen glands and no wheezing     Past Medical History  Diagnosis Date  . Diabetes mellitus   . Hypertension   . Thyroid disease   . Arthritis     endstage changes bilateral knees/bilateral ankles.   .  High cholesterol   . 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 02/17/2012    Continue diplopia, left eye pain and left headaches.    . Depression, reactive   . RA (rheumatoid arthritis)     has been off methotreaxte since 10/13.  . Contusion of left knee     due to fall 1/14.  [name] URI (upper respiratory infection)    Past Surgical History  Procedure Laterality Date  . Abdominal hysterectomy    . Ovary surgery    . Shoulder surgery    . Brain surgery      Gamma knife 10/13. Needs repeat spring  '14   Family History  Problem Relation Age of Onset  . Diabetes Mother   . Hypertension Father   . Diabetes Sister   . Diabetes Brother   . Hypertension Brother   . Heart attack Mother   . Lung cancer Father    History  Substance Use Topics  . Smoking status: Never Smoker   . Smokeless tobacco: Not on file  . Alcohol Use: No   OB History   Grav Para Term Preterm Abortions TAB SAB Ect Mult Living                 Review of Systems  Constitutional: Negative for fever, chills, diaphoresis, activity change, appetite change and fatigue.  HENT: Positive for congestion and sore throat. Negative for facial swelling, rhinorrhea and sneezing.   Eyes: Negative for photophobia and discharge.  Respiratory: Positive for  cough, sputum production and shortness of breath. Negative for chest tightness and wheezing.   Cardiovascular: Negative for chest pain, palpitations, claudication, leg swelling and syncope.  Gastrointestinal: Negative for nausea, vomiting, abdominal pain and diarrhea.  Endocrine: Negative for polydipsia and polyuria.  Genitourinary: Negative for dysuria, frequency, difficulty urinating and pelvic pain.  Musculoskeletal: Negative for arthralgias, back pain, myalgias, neck pain and neck stiffness.  Skin: Negative for color change and wound.  Allergic/Immunologic: Negative for immunocompromised state.  Neurological: Negative for facial asymmetry, weakness, numbness  and headaches.  Hematological: Does not bruise/bleed easily.  Psychiatric/Behavioral: Negative for confusion and agitation.    Allergies  Codeine; Remicade; and Zestril  Home Medications   Current Outpatient Rx  Name  Route  Sig  Dispense  Refill  . acetaminophen (TYLENOL) 500 MG tablet   Oral   Take 1,000 mg by mouth 2 (two) times daily as needed for pain. Takes with tramadol         . allopurinol (ZYLOPRIM) 100 MG tablet   Oral   Take 200 mg by mouth daily.         [name] aspirin EC 81 MG tablet   Oral   Take 81 mg by mouth daily.         [name] atorvastatin (LIPITOR) 20 MG tablet   Oral   Take 20 mg by mouth daily.         . Calcium Carb-Cholecalciferol (CALCIUM + D3 PO)   Oral   Take 2 tablets by mouth daily.         . colchicine 0.6 MG tablet   Oral   Take 0.6 mg by mouth daily as needed (GOUT).         . DULoxetine (CYMBALTA) 60 MG capsule   Oral   Take 120 mg by mouth daily.         [name] gabapentin (NEURONTIN) 600 MG tablet   Oral   Take 1,200-1,800 mg by mouth 3 (three) times daily. Take 2 tablets in the morning, 2 tablets in the middle of the day, and 3 tablets at night         . hydroxychloroquine (PLAQUENIL) 200 MG tablet   Oral   Take 200 mg by mouth 2 (two) times daily.          [name] levothyroxine (SYNTHROID, LEVOTHROID) 50 MCG tablet   Oral   Take 1 tablet (50 mcg total) by mouth daily.   30 tablet   1   . linagliptin (TRADJENTA) 5 MG TABS tablet   Oral   Take 5 mg by mouth daily.         . metoprolol succinate (TOPROL-XL) 100 MG 24 hr tablet   Oral   Take 100 mg by mouth daily. Take with or immediately following a meal.         . morphine (MSIR) 15 MG tablet   Oral   Take 1 tablet (15 mg total) by mouth every 6 (six) hours as needed.   60 tablet   0   . Multiple Vitamin (MULTIVITAMIN WITH MINERALS) TABS   Oral   Take 1 tablet by mouth daily.         . nitroGLYCERIN (NITROSTAT) 0.4 MG SL tablet   Sublingual   Place 0.4 mg  under the tongue every 5 (five) minutes as needed for chest pain.         [name] olmesartan (BENICAR) 40 MG tablet   Oral   Take 40 mg by mouth  daily.         . predniSONE (DELTASONE) 1 MG tablet   Oral   Take 7 mg by mouth daily.         . traMADol (ULTRAM) 50 MG tablet   Oral   Take 50 mg by mouth every 4 (four) hours as needed for pain.          [name] UNABLE TO FIND   Injection   Inject 400 mLs as directed every 30 (thirty) days. Atemira-IV solution monthly         . azithromycin (ZITHROMAX Z-PAK) 250 MG tablet      2 po day one, then 1 daily x 4 days   5 tablet   0    BP 171/92  Pulse 74  Temp(Src) 97.5 F (36.4 C) (Oral)  Resp 20  SpO2 99% Physical Exam  Constitutional: She is oriented to person, place, and time. She appears well-developed and well-nourished. No distress.  HENT:  Head: Normocephalic and atraumatic.  Mouth/Throat: No oropharyngeal exudate.  Eyes: Pupils are equal, round, and reactive to light.  Neck: Normal range of motion. Neck supple.  Cardiovascular: Normal rate, regular rhythm and normal heart sounds.  Exam reveals no gallop and no friction rub.   No murmur heard. Pulmonary/Chest: Effort normal. No respiratory distress. She has wheezes in the left lower field. She has no rales.  Abdominal: Soft. Bowel sounds are normal. She exhibits no distension and no mass. There is no tenderness. There is no rebound and no guarding.  Musculoskeletal: Normal range of motion. She exhibits no edema and no tenderness.  Neurological: She is alert and oriented to person, place, and time.  Skin: Skin is warm and dry.  Psychiatric: She has a normal mood and affect.    ED Course  Procedures (including critical care time) Labs Review Labs Reviewed  CBC - Abnormal; Notable for the following:    Platelets 130 (*)    All other components within normal limits  BASIC METABOLIC PANEL - Abnormal; Notable for the following:    Potassium 3.1 (*)    Glucose, Bld 104  (*)    GFR calc non Af Amer 60 (*)    GFR calc Af Amer 69 (*)    All other components within normal limits  PRO B NATRIURETIC PEPTIDE - Abnormal; Notable for the following:    Pro B Natriuretic peptide (BNP) 196.5 (*)    All other components within normal limits  POCT I-STAT TROPONIN I   Imaging Review Dg Chest 2 View  03/20/2013   CLINICAL DATA:  Shortness of breath  EXAM: CHEST  2 VIEW  COMPARISON:  10/05/2012  FINDINGS: Cardiac shadow is mildly prominent when compare with the prior exam. Interstitial changes are seen consistent with mild congestive failure. No focal confluent infiltrate is seen. No sizable effusion is noted.  IMPRESSION: Interstitial changes consistent with mild edema.   Electronically Signed   By: Inez Catalina M.D.   On: 03/20/2013 14:32    EKG Interpretation     Ventricular Rate:  83 PR Interval:  158 QRS Duration: 84 QT Interval:  394 QTC Calculation: 462 R Axis:   -7 Text Interpretation:  Normal sinus rhythm Possible Left atrial enlargement Borderline ECG            MDM   1. URI (upper respiratory infection)   2. Dyspnea    Pt is a 71 y.o. female with Pmhx as above who presents with several days of productive  cough, congestion, SOB.  Pt send from [facility] with CXR w/ mild edema.  Here, 100% on RA, no desp distress, mild localized wheezing LLL.  Symmetric BLLE edema, which is inc from baseline edema per pt.  Trop negative, EKG as above, BNP with mild elevation on 196.5.  Clinical picture more c/w acute respiratory infection, although pt may have element of mild CHF.  Doubt ACS, PE, PNA.  Will treat with dose IV lasix, albuterol, will do ambulatory pulse Ox.   Pt feeling improved, ambulated w/o difficulty. Will d/c home w/ rx for azithromycin, albuterol.  She can f/u closely w/ PCP and cardiology.  Return precautions given for new or worsening symptoms including CP, worsening SOB, fever.           Neta Ehlers, MD 03/21/13 [phone]

## 2013-03-20 NOTE — ED Notes (Signed)
C/o sob due to coughing. States she has been congested for a couple of days now.  OTC medications taken but no relief.

## 2013-03-20 NOTE — ED Notes (Signed)
Pt assessed for shortness of breath. Pt reports UR congestion with productive cough. VS obtained; spO2 is 93% at this time. Pt roomed for further evaluation by provider.

## 2013-03-20 NOTE — ED Provider Notes (Signed)
Medical screening examination/treatment/procedure(s) were performed by non-physician practitioner and as supervising physician I was immediately available for consultation/collaboration.  Philipp Deputy, M.D.  Harden Mo, MD 03/20/13 [phone]

## 2013-03-30 ENCOUNTER — Telehealth: Payer: Self-pay | Admitting: *Deleted

## 2013-03-30 ENCOUNTER — Encounter: Payer: Self-pay | Admitting: Physical Medicine & Rehabilitation

## 2013-03-30 ENCOUNTER — Encounter: Payer: Medicare Other | Attending: Physical Medicine & Rehabilitation | Admitting: Physical Medicine & Rehabilitation

## 2013-03-30 VITALS — BP 187/95 | HR 105 | Resp 16 | Ht 64.0 in | Wt 275.0 lb

## 2013-03-30 DIAGNOSIS — M48061 Spinal stenosis, lumbar region without neurogenic claudication: Secondary | ICD-10-CM | POA: Insufficient documentation

## 2013-03-30 DIAGNOSIS — IMO0002 Reserved for concepts with insufficient information to code with codable children: Secondary | ICD-10-CM | POA: Insufficient documentation

## 2013-03-30 DIAGNOSIS — M069 Rheumatoid arthritis, unspecified: Secondary | ICD-10-CM | POA: Diagnosis not present

## 2013-03-30 DIAGNOSIS — M7062 Trochanteric bursitis, left hip: Secondary | ICD-10-CM

## 2013-03-30 DIAGNOSIS — M76899 Other specified enthesopathies of unspecified lower limb, excluding foot: Secondary | ICD-10-CM | POA: Diagnosis not present

## 2013-03-30 MED ORDER — TRAMADOL HCL 50 MG PO TABS: 50.0000 mg | ORAL_TABLET | ORAL | Status: DC | PRN

## 2013-03-30 MED ORDER — GABAPENTIN 600 MG PO TABS: 1200.0000 mg | ORAL_TABLET | Freq: Three times a day (TID) | ORAL | Status: DC

## 2013-03-30 NOTE — Telephone Encounter (Signed)
[name] called and she just fill her tramadol and she only got #30 and she was taking it q 6 hrs #60 before.  Was there a mistake?

## 2013-03-30 NOTE — Patient Instructions (Signed)
CALL ME WITH ANY PROBLEMS OR QUESTIONS (#[phone]).  HAVE A GOOD DAY  

## 2013-03-30 NOTE — Telephone Encounter (Signed)
Yes must have been a mistake. May call in #120. My apologies

## 2013-03-30 NOTE — Progress Notes (Signed)
Subjective:    Patient ID: [name], female    DOB: May 10, 1941, 72 y.o.   MRN: [id number]  HPI  [name] is back regarding her chronic pain. She had been doing pretty well until a week or two ago when she devleloped severe coughing. The repeated movements seemed to trigger pain in her left low back and left leg. The pain radiates down to the side of her left leg.   She was treated for the ER for cough, and perhaps mild fluid overload, and then she was discharged home. She is set up to see a pulmonologist next week to rule out pulmonary causes for her dyspnea.   Currently her left leg bothers her when she sits for a prolonged period of time. She has shooting pains going upward from her left buttocks now.   Prior to the flare she was using the morphine once to twice daily at most.   I had injected the left hip in September, and she had done very well with this until the respiratory issues. The pain is in a similar pattern once again.  She had to stop the pamelor because even the 90m made her very sleepy.    Pain Inventory Average Pain 8 Pain Right Now 8 My pain is intermittent, sharp and burning  In the last 24 hours, has pain interfered with the following? General activity 7 Relation with others 6 Enjoyment of life 7 What TIME of day is your pain at its worst? n/a Sleep (in general) Fair  Pain is worse with: walking, bending and inactivity Pain improves with: medication Relief from Meds: 5  Mobility use a cane how many minutes can you walk? 10 ability to climb steps?  yes do you drive?  yes Do you have any goals in this area?  yes  Function retired I need assistance with the following:  dressing, meal prep, household duties and shopping  Neuro/Psych bladder control problems weakness numbness tingling trouble walking depression anxiety  Prior Studies x-rays EKG  Physicians involved in your care Rheumatologist .   Family History   Problem Relation Age of Onset  . Diabetes Mother   . Hypertension Father   . Diabetes Sister   . Diabetes Brother   . Hypertension Brother   . Heart attack Mother   . Lung cancer Father    History   Social History  . Marital Status: Married    Spouse Name: N/A    Number of Children: N/A  . Years of Education: N/A   Social History Main Topics  . Smoking status: Never Smoker   . Smokeless tobacco: None  . Alcohol Use: No  . Drug Use: No  . Sexual Activity: Not Currently   Other Topics Concern  . None   Social History Narrative  . None   Past Surgical History  Procedure Laterality Date  . Abdominal hysterectomy    . Ovary surgery    . Shoulder surgery    . Brain surgery      Gamma knife 10/13. Needs repeat spring  '14   Past Medical History  Diagnosis Date  . Diabetes mellitus   . Hypertension   . Thyroid disease   . Arthritis     endstage changes bilateral knees/bilateral ankles.   . High cholesterol   . 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 02/17/2012    Continue diplopia, left eye pain and left headaches.    . Depression, reactive   . RA (  rheumatoid arthritis)     has been off methotreaxte since 10/13.  . Contusion of left knee     due to fall 1/14.  [name] URI (upper respiratory infection)    BP 187/95  Pulse 105  Resp 16  Ht _0  (1.626 m)  Wt 275 lb (124.739 kg)  BMI 47.18 kg/m2  SpO2 87%     Review of Systems  Constitutional: Positive for unexpected weight change.  Respiratory: Positive for cough, shortness of breath and wheezing.   Musculoskeletal: Positive for gait problem.  Neurological: Positive for weakness and numbness.  Psychiatric/Behavioral: Positive for dysphoric mood. The patient is nervous/anxious.   All other systems reviewed and are negative.       Objective:   Physical Exam  General: Alert and oriented x 3, No apparent distress  HEENT: Head is normocephalic, atraumatic, PERRLA, EOMI, sclera  anicteric, oral mucosa pink and moist, dentition intact, ext ear canals clear,  Neck: Supple without JVD or lymphadenopathy  Heart: Reg rate and rhythm. No murmurs rubs or gallops  Chest: CTA bilaterally without wheezes, rales, or rhonchi; no distress  Abdomen: Soft, non-tender, non-distended, bowel sounds positive.  Extremities: No clubbing, cyanosis, or edema. Pulses are 2+  Skin: Clean and intact without signs of breakdown  Neuro: Pt is cognitively appropriate with normal insight, memory, and awareness. Cranial nerves 2-12 are intact. Sensory exam is near normal in the legs... Reflexes are 2+ in all 4's. Fine motor coordination is intact. No tremors. Motor function is grossly 4 to 5/5 with pain inhbition. SLR was equivocal.  Musculoskeletal: able to bend at waist to 60 degrees with a lot of effort. Left lumbar paraspinals are tender and feel tight. Extension and right bending remain painful. . She had pain again with palpation of the left greater troch and increase pain with cross leg maneuver. She struggles with transfers. She is antalgic with gait on the right. Mild joint effusion noted right knee. Crepitus in both knees. No gross knee instability. She needed to extra time to stand from sitting.  Psych: Pt's affect is appropriate. Pt is cooperative   Assessment & Plan:   Assessment:  1. Lumbar spinal stenosis with neurogenic claudication.  2. Depression.  3. Diabetes mellitus type 2.  4. Rheumatoid arthritis. Multiple joint involvement including right knee  5. Left greater troch bursitis  6. ?recent URI  Plan:  1. Follow up with pulmonary as planned. She sounded clear to me today. She has some residual chest wall and upper back soreness from repeated coughing.  2. After informed consent and preparation of the skin with betadine, I again injected 54m of celestone and 3cc of 1% lidocaine around the left greater troch via lateral approach. The patient tolerated well, and no complications were  encountered. Afterward the area was cleaned and dressed. Post- injection instructions were provided. We discussed regular ice and stretching at home as well. 3.   MSO4 for breakthrough pain--she is using this appropriately. RF wasn't required today.  4. Continue per NS recs for back pain. She is only a candidate if things should substantially wrsen.  5. Continue cymbalta to 663mbid for neuropathic and axial spine pain.  5. Pamelor DC'ed  6. Follow up with me in about 2 months. 30 minutes of face to face patient care time were spent during this visit. All questions were encouraged and answered.

## 2013-04-01 DIAGNOSIS — Z23 Encounter for immunization: Secondary | ICD-10-CM | POA: Diagnosis not present

## 2013-04-01 DIAGNOSIS — E785 Hyperlipidemia, unspecified: Secondary | ICD-10-CM | POA: Diagnosis not present

## 2013-04-01 DIAGNOSIS — I129 Hypertensive chronic kidney disease with stage 1 through stage 4 chronic kidney disease, or unspecified chronic kidney disease: Secondary | ICD-10-CM | POA: Diagnosis not present

## 2013-04-01 DIAGNOSIS — N058 Unspecified nephritic syndrome with other morphologic changes: Secondary | ICD-10-CM | POA: Diagnosis not present

## 2013-04-01 DIAGNOSIS — N183 Chronic kidney disease, stage 3 unspecified: Secondary | ICD-10-CM | POA: Diagnosis not present

## 2013-04-01 DIAGNOSIS — E1129 Type 2 diabetes mellitus with other diabetic kidney complication: Secondary | ICD-10-CM | POA: Diagnosis not present

## 2013-04-01 MED ORDER — TRAMADOL HCL 50 MG PO TABS: 50.0000 mg | ORAL_TABLET | ORAL | Status: DC | PRN

## 2013-04-01 NOTE — Telephone Encounter (Signed)
Called new order in for tramadol.  Left message informing patient.

## 2013-04-06 ENCOUNTER — Encounter: Payer: Self-pay | Admitting: Internal Medicine

## 2013-04-06 ENCOUNTER — Ambulatory Visit (INDEPENDENT_AMBULATORY_CARE_PROVIDER_SITE_OTHER): Payer: Medicare Other | Admitting: Internal Medicine

## 2013-04-06 VITALS — BP 140/86 | HR 84 | Ht 64.0 in | Wt 276.2 lb

## 2013-04-06 DIAGNOSIS — J8410 Pulmonary fibrosis, unspecified: Secondary | ICD-10-CM | POA: Diagnosis not present

## 2013-04-06 DIAGNOSIS — J849 Interstitial pulmonary disease, unspecified: Secondary | ICD-10-CM

## 2013-04-06 NOTE — Progress Notes (Signed)
Subjective:    Patient ID: [name], female    DOB: Mar 18, 1941, 72 y.o.   MRN: [id number] PCP Maximino Greenland, MD Rehum - Dr Estanislado Pandy Cards - Dr Woody Seller at [facility]  HPI  IOV 04/06/2013  Chief Complaint  Patient presents with  . Pulmonary Consult    for SOb x 2 months.    72 year-old morbidly obese [name] female, nonsmoker. History of rheumatoid arthritis for several years. Referred for dyspnea  Background rheumatoid arthritis history  - Noted arthritis present for several years. Mostly affecting the joints. Follows with rheumatologist. SHe used to be on remicade with MTx 5-6 years ago. Then on Orencia with methotrexate. Last few years MTXaline and finally due to worsening jt disesae MTx stopped and she is on actmera with prednisone, colchicine, Plaquenil along with other pain medications of Neurontin, Cymbalta, morphine and Tylenol    Current history  - She is now reporting 2 months of insidious onset of shortness of breath. Several months ago she was seen by cardiologist for edema and was apparently told her heart was fine. Current dyspnea is not progressive but is of moderate to severe in intensity. Worsened by exertion relieved by rest but all the present even at rest. Changing clothes makes her dyspneic. Improved by staying still. No suicide orthopnea, chest pain. Currently the edema status is improved she says.   Dyspnea relevant Hx  - Walk test 185 feet x3 laps on room air today 04/06/2013. She only completed 2 laps and got very dyspneic and fatigued. The heart rate was 111. She desaturated to 88%  - Recollects history bronchoscopy several years ago by Dr. Asencion Noble I do not have these results with him he   -  reports that she has never smoked. She does not have any smokeless tobacco history on file.   - Body mass index is 47.39 kg/(m^2).  - Lab is rheumatologist 03/14/2013 shows a CBC white cell count of 3.7-10% used to flows.  Creatinine of 0.99 mg percent  CT 03/13/04  IMPRESSION  1. No evidence of pulmonary embolism or other acute disease.  2. Chronic bibasilar interstitial changes and shotty hilar and mediastinal lymph nodes, which are  nonspecific.  Provider: [name]   Past Medical History  Diagnosis Date  . Diabetes mellitus   . Hypertension   . Thyroid disease   . Arthritis     endstage changes bilateral knees/bilateral ankles.   . High cholesterol   . 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 02/17/2012    Continue diplopia, left eye pain and left headaches.    . Depression, reactive   . RA (rheumatoid arthritis)     has been off methotreaxte since 10/13.  . Contusion of left knee     due to fall 1/14.  [name] URI (upper respiratory infection)      Family History  Problem Relation Age of Onset  . Diabetes Mother   . Hypertension Father   . Diabetes Sister   . Diabetes Brother   . Hypertension Brother   . Heart attack Mother   . Lung cancer Father      History   Social History  . Marital Status: Married    Spouse Name: N/A    Number of Children: N/A  . Years of Education: N/A   Occupational History  . Not on file.   Social History Main Topics  . Smoking status: Never Smoker   . Smokeless tobacco: Not on  file  . Alcohol Use: No  . Drug Use: No  . Sexual Activity: Not Currently   Other Topics Concern  . Not on file   Social History Narrative  . No narrative on file     Allergies  Allergen Reactions  . Codeine Swelling    Facial swelling  . Remicade [Infliximab]     "sent me into shock"  . Zestril [Lisinopril] Swelling    Face and neck swelling     Outpatient Prescriptions Prior to Visit  Medication Sig Dispense Refill  . acetaminophen (TYLENOL) 500 MG tablet Take 1,000 mg by mouth 2 (two) times daily as needed for pain. Takes with tramadol      . allopurinol (ZYLOPRIM) 100 MG tablet Take 200 mg by mouth daily.      [name] aspirin EC 81 MG tablet  Take 81 mg by mouth daily.      [name] atorvastatin (LIPITOR) 20 MG tablet Take 20 mg by mouth daily.      . Calcium Carb-Cholecalciferol (CALCIUM + D3 PO) Take 2 tablets by mouth daily.      . colchicine 0.6 MG tablet Take 0.6 mg by mouth daily as needed (GOUT).      . DULoxetine (CYMBALTA) 60 MG capsule Take 120 mg by mouth daily.      [name] gabapentin (NEURONTIN) 600 MG tablet Take 2-3 tablets (1,200-1,800 mg total) by mouth 3 (three) times daily. Take 2 tablets in the morning, 2 tablets in the middle of the day, and 3 tablets at night  210 tablet  3  . hydroxychloroquine (PLAQUENIL) 200 MG tablet Take 200 mg by mouth 2 (two) times daily.       [name] levothyroxine (SYNTHROID, LEVOTHROID) 50 MCG tablet Take 1 tablet (50 mcg total) by mouth daily.  30 tablet  1  . linagliptin (TRADJENTA) 5 MG TABS tablet Take 5 mg by mouth daily.      . metoprolol succinate (TOPROL-XL) 100 MG 24 hr tablet Take 100 mg by mouth daily. Take with or immediately following a meal.      . morphine (MSIR) 15 MG tablet Take 1 tablet (15 mg total) by mouth every 6 (six) hours as needed.  60 tablet  0  . Multiple Vitamin (MULTIVITAMIN WITH MINERALS) TABS Take 1 tablet by mouth daily.      . nitroGLYCERIN (NITROSTAT) 0.4 MG SL tablet Place 0.4 mg under the tongue every 5 (five) minutes as needed for chest pain.      [name] olmesartan (BENICAR) 40 MG tablet Take 40 mg by mouth daily.      . predniSONE (DELTASONE) 1 MG tablet Take 7 mg by mouth daily.      . traMADol (ULTRAM) 50 MG tablet Take 1 tablet (50 mg total) by mouth every 4 (four) hours as needed.  120 tablet  3  . UNABLE TO FIND Inject 400 mLs as directed every 30 (thirty) days. Atemira-IV solution monthly      . azithromycin (ZITHROMAX Z-PAK) 250 MG tablet 2 po day one, then 1 daily x 4 days  5 tablet  0   No facility-administered medications prior to visit.      Review of Systems  Constitutional: Negative for fever and unexpected weight change.  HENT: Negative for congestion,  dental problem, ear pain, nosebleeds, postnasal drip, rhinorrhea, sinus pressure, sneezing, sore throat and trouble swallowing.   Eyes: Negative for redness and itching.  Respiratory: Positive for shortness of breath. Negative for cough, chest tightness and  wheezing.   Cardiovascular: Negative for palpitations and leg swelling.  Gastrointestinal: Negative for nausea and vomiting.  Genitourinary: Negative for dysuria.  Musculoskeletal: Positive for joint swelling.  Skin: Negative for rash.  Neurological: Negative for headaches.  Hematological: Does not bruise/bleed easily.  Psychiatric/Behavioral: Negative for dysphoric mood. The patient is not nervous/anxious.        Objective:   Physical Exam  Vitals reviewed. Constitutional: She is oriented to person, place, and time. She appears well-developed and well-nourished. No distress.  Body mass index is 47.39 kg/(m^2).   HENT:  Head: Normocephalic and atraumatic.  Right Ear: External ear normal.  Left Ear: External ear normal.  Mouth/Throat: Oropharynx is clear and moist. No oropharyngeal exudate.  Eyes: Conjunctivae and EOM are normal. Pupils are equal, round, and reactive to light. Right eye exhibits no discharge. Left eye exhibits no discharge. No scleral icterus.  Neck: Normal range of motion. Neck supple. No JVD present. No tracheal deviation present. No thyromegaly present.  Cardiovascular: Normal rate, regular rhythm, normal heart sounds and intact distal pulses.  Exam reveals no gallop and no friction rub.   No murmur heard. Pulmonary/Chest: Effort normal. No respiratory distress. She has no wheezes. She has rales. She exhibits no tenderness.  crackeles bottom 1/3  Abdominal: Soft. Bowel sounds are normal. She exhibits no distension and no mass. There is no tenderness. There is no rebound and no guarding.  Musculoskeletal: Normal range of motion. She exhibits no edema and no tenderness.  Uses cane Antalgic gait  Lymphadenopathy:     She has no cervical adenopathy.  Neurological: She is alert and oriented to person, place, and time. She has normal reflexes. No cranial nerve deficit. She exhibits normal muscle tone. Coordination normal.  Skin: Skin is warm and dry. No rash noted. She is not diaphoretic. No erythema. No pallor.  Psychiatric: Judgment and thought content normal.  Somewhat flat affect          Assessment & Plan:

## 2013-04-06 NOTE — Patient Instructions (Signed)
Am concerned that he might have interstitial lung disease [ILD] due to rheumatoid arthritis  - Please do a CT scan of the chest at [facility] - Please do full pulmonary function test - Please do walk test on room air - Please do overnight oxygen study at home  Followup  - Return to see me or nurse practitioner within one month but after you finished the above test

## 2013-04-08 ENCOUNTER — Ambulatory Visit (INDEPENDENT_AMBULATORY_CARE_PROVIDER_SITE_OTHER)
Admission: RE | Admit: 2013-04-08 | Discharge: 2013-04-08 | Disposition: A | Discharge status: Home / Self Care | Payer: Medicare Other | Source: Ambulatory Visit | Attending: Internal Medicine | Admitting: Internal Medicine

## 2013-04-08 DIAGNOSIS — J8410 Pulmonary fibrosis, unspecified: Secondary | ICD-10-CM

## 2013-04-08 DIAGNOSIS — J849 Interstitial pulmonary disease, unspecified: Secondary | ICD-10-CM

## 2013-04-10 ENCOUNTER — Telehealth: Payer: Self-pay | Admitting: Internal Medicine

## 2013-04-10 DIAGNOSIS — J849 Interstitial pulmonary disease, unspecified: Secondary | ICD-10-CM | POA: Insufficient documentation

## 2013-04-10 NOTE — Telephone Encounter (Signed)
CT shows ILD. WTIh her hx of RA and  being on iimmuniosuppressive drugs: ddx is this is RA or opportunistic infection. SHe will need bronch with BAL.[name] So need quicker followup than 05/11/13 preferred with me in dec 2015 by mid-dec 2014. IF that is not poissioble, have her see TP the NP, so I Can set bronch through her. She needs to finish her other tests as well   Dr. Brand Males, M.D., [facility].C.P Pulmonary and Critical Care Medicine Staff Physician [facility] Pager: [phone], If no answer or between  15:00h - 7:00h: call [phone]  04/10/2013 3:56 PM

## 2013-04-10 NOTE — Assessment & Plan Note (Signed)
Am concerned that he might have interstitial lung disease [ILD] due to rheumatoid arthritis  - Please do a CT scan of the chest at [facility] - Please do full pulmonary function test - Please do walk test on room air - Please do overnight oxygen study at home  Followup  - Return to see me or nurse practitioner within one month but after you finished the above tes

## 2013-04-11 DIAGNOSIS — R0902 Hypoxemia: Secondary | ICD-10-CM | POA: Diagnosis not present

## 2013-04-13 NOTE — Telephone Encounter (Signed)
I spoke with the pt and appt has been changed to 04-28-13 with PFT prior at [facility]. Pt is aware. [place] Bing, CMA

## 2013-04-19 ENCOUNTER — Telehealth: Payer: Self-pay | Admitting: Internal Medicine

## 2013-04-19 DIAGNOSIS — J810 Acute pulmonary edema: Secondary | ICD-10-CM | POA: Diagnosis not present

## 2013-04-19 DIAGNOSIS — M069 Rheumatoid arthritis, unspecified: Secondary | ICD-10-CM | POA: Diagnosis not present

## 2013-04-19 DIAGNOSIS — N058 Unspecified nephritic syndrome with other morphologic changes: Secondary | ICD-10-CM | POA: Diagnosis not present

## 2013-04-19 DIAGNOSIS — I129 Hypertensive chronic kidney disease with stage 1 through stage 4 chronic kidney disease, or unspecified chronic kidney disease: Secondary | ICD-10-CM | POA: Diagnosis not present

## 2013-04-19 DIAGNOSIS — Z79899 Other long term (current) drug therapy: Secondary | ICD-10-CM | POA: Diagnosis not present

## 2013-04-19 DIAGNOSIS — N182 Chronic kidney disease, stage 2 (mild): Secondary | ICD-10-CM | POA: Diagnosis not present

## 2013-04-19 DIAGNOSIS — M545 Low back pain, unspecified: Secondary | ICD-10-CM | POA: Diagnosis not present

## 2013-04-19 DIAGNOSIS — E559 Vitamin D deficiency, unspecified: Secondary | ICD-10-CM | POA: Diagnosis not present

## 2013-04-19 DIAGNOSIS — J209 Acute bronchitis, unspecified: Secondary | ICD-10-CM | POA: Diagnosis not present

## 2013-04-19 DIAGNOSIS — E1129 Type 2 diabetes mellitus with other diabetic kidney complication: Secondary | ICD-10-CM | POA: Diagnosis not present

## 2013-04-19 NOTE — Telephone Encounter (Signed)
ono 04/11/13 shows lowerst pulse ox 79% and total time </= 89% at 6 min and </= 88% at 3 min. She has very mild desat. Does not qualify for overnight oxygen  Dr. Brand Males, M.D., [facility].C.P Pulmonary and Critical Care Medicine Staff Physician [place] Pager: [phone], If no answer or between  15:00h - 7:00h: call [phone]  04/19/2013 11:13 PM

## 2013-04-20 DIAGNOSIS — Z006 Encounter for examination for normal comparison and control in clinical research program: Secondary | ICD-10-CM | POA: Diagnosis not present

## 2013-04-20 DIAGNOSIS — N182 Chronic kidney disease, stage 2 (mild): Secondary | ICD-10-CM | POA: Diagnosis not present

## 2013-04-20 DIAGNOSIS — M069 Rheumatoid arthritis, unspecified: Secondary | ICD-10-CM | POA: Diagnosis not present

## 2013-04-20 DIAGNOSIS — E1129 Type 2 diabetes mellitus with other diabetic kidney complication: Secondary | ICD-10-CM | POA: Diagnosis not present

## 2013-04-21 NOTE — Telephone Encounter (Signed)
Pt advised. Jennifer Castillo, CMA  

## 2013-04-22 DIAGNOSIS — Z79899 Other long term (current) drug therapy: Secondary | ICD-10-CM | POA: Diagnosis not present

## 2013-04-27 DIAGNOSIS — R0602 Shortness of breath: Secondary | ICD-10-CM | POA: Diagnosis not present

## 2013-04-27 DIAGNOSIS — E119 Type 2 diabetes mellitus without complications: Secondary | ICD-10-CM | POA: Diagnosis not present

## 2013-04-27 DIAGNOSIS — M069 Rheumatoid arthritis, unspecified: Secondary | ICD-10-CM | POA: Diagnosis not present

## 2013-04-27 DIAGNOSIS — J8410 Pulmonary fibrosis, unspecified: Secondary | ICD-10-CM | POA: Diagnosis not present

## 2013-04-28 ENCOUNTER — Ambulatory Visit (HOSPITAL_COMMUNITY)
Admission: RE | Admit: 2013-04-28 | Discharge: 2013-04-28 | Disposition: A | Discharge status: Home / Self Care | Payer: Medicare Other | Source: Ambulatory Visit | Attending: Internal Medicine | Admitting: Internal Medicine

## 2013-04-28 ENCOUNTER — Encounter: Payer: Self-pay | Admitting: Internal Medicine

## 2013-04-28 ENCOUNTER — Ambulatory Visit (INDEPENDENT_AMBULATORY_CARE_PROVIDER_SITE_OTHER): Payer: Medicare Other | Admitting: Internal Medicine

## 2013-04-28 VITALS — BP 136/92 | HR 90 | Ht 64.0 in | Wt 279.0 lb

## 2013-04-28 DIAGNOSIS — J8410 Pulmonary fibrosis, unspecified: Secondary | ICD-10-CM

## 2013-04-28 DIAGNOSIS — J849 Interstitial pulmonary disease, unspecified: Secondary | ICD-10-CM

## 2013-04-28 LAB — PULMONARY FUNCTION TEST
FEF 25-75 Pre: 2.36 L/sec
FEF2575-%Pred-Pre: 143 %
FEV1-%Pred-Post: 63 %
FEV1-Post: 1.15 L
FEV1FVC-%Change-Post: 14 %
FEV6-%Pred-Post: 53 %
FEV6-%Pred-Pre: 76 %
FEV6-Post: 1.19 L
FEV6-Pre: 1.72 L
FEV6FVC-%Change-Post: 0 %
FEV6FVC-%Pred-Post: 104 %
FEV6FVC-%Pred-Pre: 103 %
FVC-%Change-Post: -31 %
FVC-%Pred-Post: 51 %
FVC-%Pred-Pre: 74 %
FVC-Post: 1.19 L
FVC-Pre: 1.73 L
Post FEV6/FVC ratio: 100 %
Pre FEV1/FVC ratio: 84 %
Pre FEV6/FVC Ratio: 99 %
RV % pred: 62 %
TLC % pred: 58 %

## 2013-04-28 MED ORDER — ALBUTEROL SULFATE (5 MG/ML) 0.5% IN NEBU
2.5000 mg | INHALATION_SOLUTION | Freq: Once | RESPIRATORY_TRACT | Status: AC
  Administered 2013-04-28: 2.5 mg via RESPIRATORY_TRACT

## 2013-04-28 NOTE — Progress Notes (Signed)
Subjective:    Patient ID: [name], female    DOB: 02/18/1941, 72 y.o.   MRN: [id number]  HPI  PCP Maximino Greenland, MD Re[facility] - Dr Estanislado Pandy Cards - Dr Woody Seller at [facility]  HPI  IOV 04/06/2013  Chief Complaint  Patient presents with  . Pulmonary Consult    for SOb x 2 months.    71 year-old morbidly obese [name] female, nonsmoker. History of rheumatoid arthritis for several years. Referred for dyspnea  Background rheumatoid arthritis history  - Noted arthritis present for several years. Mostly affecting the joints. Follows with rheumatologist. SHe used to be on remicade with MTx 5-6 years ago. Then on Orencia with methotrexate. Last few years MTXaline and finally due to worsening jt disesae MTx stopped and she is on actmera with prednisone, colchicine, Plaquenil along with other pain medications of Neurontin, Cymbalta, morphine and Tylenol    Current history  - She is now reporting 2 months of insidious onset of shortness of breath. Several months ago she was seen by cardiologist for edema and was apparently told her heart was fine. Current dyspnea is not progressive but is of moderate to severe in intensity. Worsened by exertion relieved by rest but all the present even at rest. Changing clothes makes her dyspneic. Improved by staying still. No suicide orthopnea, chest pain. Currently the edema status is improved she says.   Dyspnea relevant Hx  - Walk test 185 feet x3 laps on room air today 04/06/2013. She only completed 2 laps and got very dyspneic and fatigued. The heart rate was 111. She desaturated to 88%  - Recollects history bronchoscopy several years ago by Dr. Asencion Noble I do not have these results with him he   -  reports that she has never smoked. She does not have any smokeless tobacco history on file.   - Body mass index is 47.39 kg/(m^2).  - Lab is rheumatologist 03/14/2013 shows a CBC white cell count of 3.7-10% used to  flows. Creatinine of 0.99 mg percent  CT 03/13/04  IMPRESSION  1. No evidence of pulmonary embolism or other acute disease.  2. Chronic bibasilar interstitial changes and shotty hilar and mediastinal lymph nodes, which are  nonspecific.  Provider: [name]  OV 04/28/2013   Chief Complaint  Patient presents with  . Follow-up    Pt c/o SOB with exertion, hoarseness, wheezing.  Pt denies any congestion.     CT shows ILD. WTIh her hx of RA and  being on iimmuniosuppressive drugs: ddx is this is RA or opportunistic infection. SHe will need bronch with BAL.[name] So need quicker followup than 05/11/13 preferred with me in dec 2015 by mid-dec 2014. IF that is not poissioble, have her see TP the NP, so I Can set bronch through her. She needs to finish her other tests as well   ono 04/11/13 shows lowerst pulse ox 79% and total time </= 89% at 6 min and </= 88% at 3 min. She has very mild desat. Does not qualify for overnight oxygen  Pulmonary function test 04/28/2013 shows FEV1 1.7 L/74%. FEV1 11.46 L/81%. Ratio to 84/109%. Suggestive of restriction. Total lung capacity is 2.9 L/58% and consistent with restriction.  To discuss results: does not want portable o2. Stable dyspnea. No change in health. She is agreeable to bronch for BAL - ddx opportunistic infetin v RA-ILD. Too high risk for transbronchial bx + low yield  Review of Systems  Constitutional: Negative for fever  and unexpected weight change.  HENT: Positive for trouble swallowing. Negative for congestion, dental problem, ear pain, nosebleeds, postnasal drip, rhinorrhea, sinus pressure, sneezing and sore throat.   Eyes: Negative for redness and itching.  Respiratory: Positive for cough and shortness of breath. Negative for chest tightness and wheezing.   Cardiovascular: Negative for palpitations and leg swelling.  Gastrointestinal: Negative for nausea and vomiting.  Genitourinary: Negative for dysuria.  Musculoskeletal: Negative  for joint swelling.  Skin: Negative for rash.  Neurological: Negative for headaches.  Hematological: Does not bruise/bleed easily.  Psychiatric/Behavioral: Negative for dysphoric mood. The patient is not nervous/anxious.        Objective:   Physical Exam  Filed Vitals:   04/28/13 1205  BP: 136/92  Pulse: 90  Height: _0  (1.626 m)  Weight: 279 lb (126.554 kg)  SpO2: 93%     Discussion only visit      Assessment & Plan:

## 2013-04-28 NOTE — Assessment & Plan Note (Addendum)
Lab work clearly shows restriction on pulmonary function test with inflammatory interstitial lung disease CT scan of the chest and expected exertional desaturations. She does not want oxygen therapy. Differential diagnosis is opportunistic infections versus rheumatoid arthritis related interstitial lung disease. At this point she will need bronchoscopy with bronchoalveolar lavage to rule out opportunistic infections. Given her obesity and exertional desaturation she will be high-risk for transbronchial biopsies and so I will avoid that  Risks of pneumothorax, hemothorax, sedation/anesthesia complications such as cardiac or respiratory arrest or hypotension, stroke and bleeding all explained. Benefits of diagnosis but limitations of non-diagnosis also explained. Patient verbalized understanding and wished to proceed.   > 50% of this > 25 min visit spent in face to face counseling (15 min visit converted to 25 min)

## 2013-04-28 NOTE — Patient Instructions (Signed)
You will need bronchoscopy with lavage    -let us plan for 05/03/13 tue or 05/04/13,  7.30am at [facility]   - small scope will be requested, No TB risk

## 2013-05-03 ENCOUNTER — Telehealth: Payer: Self-pay | Admitting: *Deleted

## 2013-05-03 DIAGNOSIS — J8410 Pulmonary fibrosis, unspecified: Secondary | ICD-10-CM | POA: Diagnosis not present

## 2013-05-03 DIAGNOSIS — R0902 Hypoxemia: Secondary | ICD-10-CM | POA: Diagnosis not present

## 2013-05-03 DIAGNOSIS — I5033 Acute on chronic diastolic (congestive) heart failure: Secondary | ICD-10-CM | POA: Diagnosis not present

## 2013-05-03 DIAGNOSIS — J810 Acute pulmonary edema: Secondary | ICD-10-CM | POA: Diagnosis not present

## 2013-05-03 NOTE — Telephone Encounter (Signed)
Here is the phone note for you. [place] Bing, CMA

## 2013-05-04 ENCOUNTER — Inpatient Hospital Stay (HOSPITAL_COMMUNITY): Admission: RE | Admit: 2013-05-04 | Payer: Medicare Other | Source: Ambulatory Visit

## 2013-05-04 ENCOUNTER — Ambulatory Visit (INDEPENDENT_AMBULATORY_CARE_PROVIDER_SITE_OTHER): Payer: Medicare Other | Admitting: Internal Medicine

## 2013-05-04 ENCOUNTER — Encounter: Payer: Self-pay | Admitting: Internal Medicine

## 2013-05-04 ENCOUNTER — Encounter (HOSPITAL_COMMUNITY): Admission: RE | Payer: Self-pay | Source: Ambulatory Visit

## 2013-05-04 ENCOUNTER — Telehealth: Payer: Self-pay | Admitting: Internal Medicine

## 2013-05-04 ENCOUNTER — Ambulatory Visit (HOSPITAL_COMMUNITY): Admission: RE | Admit: 2013-05-04 | Payer: Medicare Other | Source: Ambulatory Visit | Admitting: Internal Medicine

## 2013-05-04 VITALS — BP 124/62 | HR 90

## 2013-05-04 DIAGNOSIS — J8410 Pulmonary fibrosis, unspecified: Secondary | ICD-10-CM

## 2013-05-04 DIAGNOSIS — J849 Interstitial pulmonary disease, unspecified: Secondary | ICD-10-CM

## 2013-05-04 SURGERY — VIDEO BRONCHOSCOPY WITHOUT FLUORO
Anesthesia: Moderate Sedation | Laterality: Bilateral

## 2013-05-04 MED ORDER — PREDNISONE 10 MG PO TABS: ORAL_TABLET | ORAL | Status: DC

## 2013-05-04 MED ORDER — PANTOPRAZOLE SODIUM 40 MG PO TBEC: 40.0000 mg | DELAYED_RELEASE_TABLET | Freq: Every day | ORAL | Status: DC

## 2013-05-04 MED ORDER — FAMOTIDINE 20 MG PO TABS: ORAL_TABLET | ORAL | Status: DC

## 2013-05-04 NOTE — Progress Notes (Signed)
Subjective:    Patient ID: [name], female    DOB: [birth year], 72 y.o.   MRN: [id number]  HPI  PCP Maximino Greenland, MD [place] - Dr Estanislado Pandy Cards - Dr Woody Seller at [facility] Card[facility]  HPI  IOV 04/06/2013  Chief Complaint  Patient presents with  . Pulmonary Consult    for SOb x 2 months.    72 year-old morbidly obese [name] female, nonsmoker. History of rheumatoid arthritis for several years. Referred for dyspnea  Background rheumatoid arthritis history  - Noted arthritis present for several years. Mostly affecting the joints. Follows with rheumatologist. SHe used to be on remicade with MTx 5-6 years ago. Then on Orencia with methotrexate. Last few years MTXaline and finally due to worsening jt disesae MTx stopped and she is on actmera with prednisone, colchicine, Plaquenil along with other pain medications of Neurontin, Cymbalta, morphine and Tylenol    Current history  - She is now reporting 2 months of insidious onset of shortness of breath. Several months ago she was seen by cardiologist for edema and was apparently told her heart was fine. Current dyspnea is not progressive but is of moderate to severe in intensity. Worsened by exertion relieved by rest but all the present even at rest. Changing clothes makes her dyspneic. Improved by staying still. No suicide orthopnea, chest pain. Currently the edema status is improved she says.   Dyspnea relevant Hx  - Walk test 185 feet x3 laps on room air today 04/06/2013. She only completed 2 laps and got very dyspneic and fatigued. The heart rate was 111. She desaturated to 88%  - Recollects history bronchoscopy several years ago by Dr. Asencion Noble I do not have these results with him he   -  reports that she has never smoked. She does not have any smokeless tobacco history on file.   - Body mass index is 47.39 kg/(m^2).  - Lab is rheumatologist 03/14/2013 shows a CBC white cell count of 3.7-10% used to  flows. Creatinine of 0.99 mg percent  CT 03/13/04  IMPRESSION  1. No evidence of pulmonary embolism or other acute disease.  2. Chronic bibasilar interstitial changes and shotty hilar and mediastinal lymph nodes, which are  nonspecific.  Provider: [name]  OV 04/28/2013   Chief Complaint  Patient presents with  . Follow-up    Pt c/o SOB with exertion, hoarseness, wheezing.  Pt denies any congestion.     CT shows ILD. WTIh her hx of RA and  being on iimmuniosuppressive drugs: ddx is this is RA or opportunistic infection. SHe will need bronch with BAL.[name] So need quicker followup than 05/11/13 preferred with me in dec 2015 by mid-dec 2014. IF that is not poissioble, have her see TP the NP, so I Can set bronch through her. She needs to finish her other tests as well   ono 04/11/13 shows lowerst pulse ox 79% and total time </= 89% at 6 min and </= 88% at 3 min. She has very mild desat. Does not qualify for overnight oxygen  Pulmonary function test 04/28/2013 shows FEV1 1.7 L/74%. FEV1 11.46 L/81%. Ratio to 84/109%. Suggestive of restriction. Total lung capacity is 2.9 L/58% and consistent with restriction.  To discuss results: does not want portable o2. Stable dyspnea. No change in health. She is agreeable to bronch for BAL - ddx opportunistic infetin v RA-ILD. Too high risk for transbronchial bx + low yield   05/04/2013  Acute  ov/[name] re:  ILD/ acute sob x 2 days and RA worse  Chief Complaint  Patient presents with  . Acute Visit    Pt c/o increased SOB and non prod cough x 2 days. She is using rescue inhaler at least 2-3 times per day.     Inhaler really helps Reduced pred to 5 mg daily x sev weeks prior to flare and arthritis worse when breathing worse  No real change in cough freq/ intensity since sob worse   No obvious day to day or daytime variabilty or assoc chronic cough or cp or chest tightness, subjective wheeze overt sinus or hb symptoms. No unusual exp hx or h/o  childhood pna/ asthma or knowledge of premature birth.  Sleeping ok without nocturnal  or early am exacerbation  of respiratory  c/o's or need for noct saba. Also denies any obvious fluctuation of symptoms with weather or environmental changes or other aggravating or alleviating factors except as outlined above   Current Medications, Allergies, Complete Past Medical History, Past Surgical History, Family History, and Social History were reviewed in [employer] record.  ROS  The following are not active complaints unless bolded sore throat, dysphagia, dental problems, itching, sneezing,  nasal congestion or excess/ purulent secretions, ear ache,   fever, chills, sweats, unintended wt loss, pleuritic or exertional cp, hemoptysis,  orthopnea pnd or leg swelling, presyncope, palpitations, heartburn, abdominal pain, anorexia, nausea, vomiting, diarrhea  or change in bowel or urinary habits, change in stools or urine, dysuria,hematuria,  rash, arthralgias, visual complaints, headache, numbness weakness or ataxia or problems with walking or coordination,  change in mood/affect or memory.           Objective:   Physical Exam  Wt Readings from Last 3 Encounters:  04/28/13 279 lb (126.554 kg)  04/06/13 276 lb 3.2 oz (125.283 kg)  03/30/13 275 lb (124.739 kg)      HEENT: nl dentition, turbinates, and orophanx. Nl external ear canals without cough reflex   NECK :  without JVD/Nodes/TM/ nl carotid upstrokes bilaterally   LUNGS: no acc muscle use, clear to A and P bilaterally without cough on insp or exp maneuvers   CV:  RRR  no s3 or murmur or increase in P2, no edema   ABD:  soft and nontender with nl excursion in the supine position. No bruits or organomegaly, bowel sounds nl  MS:  warm without deformities, calf tenderness, cyanosis or clubbing  SKIN: warm and dry without lesions    NEURO:  alert, approp, no deficits        Assessment & Plan:

## 2013-05-04 NOTE — Patient Instructions (Addendum)
Prednisone 47m until better, then reduce to 10 mg per day  Pantoprazole (protonix) 40 mg   Take 30-60 min before first meal of the day and Pepcid 20 mg one bedtime until return to office - this is the best way to tell whether stomach acid is contributing to your problem.    Take delsym two tsp every 12 hours and supplement if needed with  tramadol 50 mg up to 1 every 4 hours to suppress the urge to cough. Swallowing water or using ice chips/non mint and menthol containing candies (such as lifesavers or sugarless jolly ranchers) are also effective.  You should rest your voice and avoid activities that you know make you cough.    GERD (REFLUX)  is an extremely common cause of respiratory symptoms, many times with no significant heartburn at all.    It can be treated with medication, but also with lifestyle changes including avoidance of late meals, excessive alcohol, smoking cessation, and avoid fatty foods, chocolate, peppermint, colas, red wine, and acidic juices such as orange juice.  NO MINT OR MENTHOL PRODUCTS SO NO COUGH DROPS  USE SUGARLESS CANDY INSTEAD (jolley ranchers or Stover's)  NO OIL BASED VITAMINS - use powdered substitutes.  Keep follow up appts Late add :  Consider change toprol to bisoprolol since pt reports her inhalers help her cough and breathing, (no obst on pft's on I would have done this today).

## 2013-05-04 NOTE — Telephone Encounter (Signed)
I called and spoke with pt. She reports she has been having a lot of increase SOB. She is scheduled to come in and see MW this afternoon for acute visit. Nothing further needed

## 2013-05-05 ENCOUNTER — Ambulatory Visit: Payer: Medicare Other | Admitting: Adult Health

## 2013-05-05 NOTE — Assessment & Plan Note (Signed)
Flare of dyspnea with decrease in prednisone dose and no real change in chronic cough strongly points to ILD related to RA  The goal with a chronic steroid dependent illness is always arriving at the lowest effective dose that controls the disease/symptoms and not accepting a set "formula" which is based on statistics or guidelines that don't always take into account patient  variability or the natural hx of the dz in every individual patient, which may well vary over time.  For now therefore I recommend the patient maintain  A ceiling of 20 mg per day and a new "raised" floor of 10 mg per day for now  Also, Use of PPI is associated with improved survival time and with decreased radiologic fibrosis per King's study published in AJRCCM vol 184 p1390.  Dec 2011  This may not be cause and effect, but given how universally unhelpful all the otherstudy drugs have been for pf,   rec start  rx max acid suppression / diet/ lifestyle modification > See instructions for specific recommendations which were reviewed directly with the patient who was given a copy with highlighter outlining the key components.

## 2013-05-11 ENCOUNTER — Ambulatory Visit: Payer: Medicare Other

## 2013-05-11 ENCOUNTER — Ambulatory Visit: Payer: Medicare Other | Admitting: Internal Medicine

## 2013-05-12 ENCOUNTER — Telehealth: Payer: Self-pay | Admitting: Internal Medicine

## 2013-05-12 NOTE — Telephone Encounter (Signed)
Please see if they can schedule her for bronch 05/24/13 Tuesday morning 7.30am at [place] - for video bronch, flexible scope, no tib risk, small scope, no fluro. Do BAL  If not, [place] in the afternoon   I prefer the former   Dr. Brand Males, M.D., [facility].C.P Pulmonary and Critical Care Medicine Staff Physician [place] [facility] Pager: [phone], If no answer or between  15:00h - 7:00h: call [phone]  05/12/2013 8:57 PM

## 2013-05-17 NOTE — Telephone Encounter (Signed)
Spoke with Sharyn Lull. On 05/24/13 a bronch is already scheduled at 7:30 AM and then they have trach clinic that afternoon in that room. Please advise MR thanks

## 2013-05-23 ENCOUNTER — Telehealth: Payer: Self-pay | Admitting: Internal Medicine

## 2013-05-23 NOTE — Telephone Encounter (Signed)
I spoke with Abigail Butts. She reports pt is due for infusion tomorrow for RA. Per Dr. Syliva Overman if we are going to treat pt for possible infection then he wants to hold off on the infusion. I advised her we do not know what Calvin will do until she is seen in the office. She will cancel the infusion for now. Nothing further needed

## 2013-05-23 NOTE — Telephone Encounter (Signed)
Spoke with pt. She reports she is still coughing a lot. She wants to be seen. She is scheduled to see [facility] tomorrow afternoon since MR had no openings. Nothing further needed

## 2013-05-24 ENCOUNTER — Ambulatory Visit (INDEPENDENT_AMBULATORY_CARE_PROVIDER_SITE_OTHER): Payer: Medicare Other | Admitting: Pulmonary Disease

## 2013-05-24 ENCOUNTER — Encounter: Payer: Self-pay | Admitting: Pulmonary Disease

## 2013-05-24 VITALS — BP 132/84 | HR 90 | Temp 97.8°F | Ht 64.0 in | Wt 284.2 lb

## 2013-05-24 DIAGNOSIS — R059 Cough, unspecified: Secondary | ICD-10-CM

## 2013-05-24 DIAGNOSIS — J849 Interstitial pulmonary disease, unspecified: Secondary | ICD-10-CM

## 2013-05-24 DIAGNOSIS — J8410 Pulmonary fibrosis, unspecified: Secondary | ICD-10-CM

## 2013-05-24 DIAGNOSIS — R05 Cough: Secondary | ICD-10-CM | POA: Diagnosis not present

## 2013-05-24 DIAGNOSIS — R053 Chronic cough: Secondary | ICD-10-CM

## 2013-05-24 MED ORDER — HYDROCOD POLST-CHLORPHEN POLST 10-8 MG/5ML PO LQCR: 5.0000 mL | Freq: Two times a day (BID) | ORAL | Status: DC | PRN

## 2013-05-24 MED ORDER — LEVOFLOXACIN 750 MG PO TABS: 750.0000 mg | ORAL_TABLET | Freq: Every day | ORAL | Status: DC

## 2013-05-24 NOTE — Progress Notes (Signed)
   Subjective:    Patient ID: [name], female    DOB: 16-Jul-1940, 73 y.o.   MRN: [id number]  HPI Patient comes in today for an acute sick visit. She has had significant issues with cough, and was started on a reflux regimen at the last visit with no improvement. She has known interstitial lung disease. She has been on immunosuppressive therapy, and the question has been raised whether this may be an opportunistic infection. Her cough was initially totally dry, but the last one week it has become more congested and rattling in nature. She is producing purulent mucus each time she coughs, and feels the congestion in her "bronchial tubes". She is also had some increased shortness of breath, primarily related to cough paroxysms. She has an allergy listed to codeine, however the patient tells me she has taken hydrocodone cough syrup in the past with no issues.   Review of Systems  Constitutional: Negative for fever and unexpected weight change.  HENT: Positive for congestion. Negative for dental problem, ear pain, nosebleeds, postnasal drip, rhinorrhea, sinus pressure, sneezing, sore throat and trouble swallowing.   Eyes: Negative for redness and itching.  Respiratory: Positive for cough, shortness of breath and wheezing. Negative for chest tightness.   Cardiovascular: Negative for palpitations and leg swelling.  Gastrointestinal: Negative for nausea and vomiting.  Genitourinary: Negative for dysuria.  Musculoskeletal: Negative for joint swelling.  Skin: Negative for rash.  Neurological: Negative for headaches.  Hematological: Does not bruise/bleed easily.  Psychiatric/Behavioral: Negative for dysphoric mood. The patient is not nervous/anxious.        Objective:   Physical Exam Obese female in no acute distress Nose without purulence or discharge noted Neck without lymphadenopathy or thyromegaly Chest with crackles two thirds of the way up bilaterally, as well as coarse  rhonchi and rattling throughout Cardiac exam with regular rate and rhythm  Lower extremities with 3+ edema, no cyanosis Alert and oriented, moves all 4 extremities.       Assessment & Plan:

## 2013-05-24 NOTE — Patient Instructions (Signed)
Will treat with levaquin 762m one each day for 5 days for possible bronchitis tussionex one teaspoon every 12 hours if needed for cough. Dr. RChase Callerwill be contacting you to arrange for bronchoscopy and followup visit.

## 2013-05-24 NOTE — Assessment & Plan Note (Signed)
The patient has chronic cough but is probably multifactorial. She has known interstitial lung disease, and the question has been raised whether she may have an opportunistic process. She now has been having increased purulent mucus and rattling congestion in her chest, suggesting and acute bronchitis. She is going to have bronchoscopy next week, and I am somewhat hesitant to treat her with an antibiotic that may affect the cultures, but she is clearly having a difficult time and I think we should go ahead and treat for bacterial processes. I will also give her a cough syrup to help with cough paroxysms.

## 2013-05-26 NOTE — Telephone Encounter (Signed)
Please try for 7am or 7./30am Jan 13, 14, 15 (tue, wed thu,) at [facility] for bronch and bal, SMall scope , no TB risk, no fluoro  Thanks  Dr. Brand Males, M.D., [facility].C.P Pulmonary and Critical Care Medicine Staff Physician [place] [facility] and Critical [facility] Pager: [phone], If no answer or between  15:00h - 7:00h: call [phone]  05/26/2013 11:27 AM

## 2013-05-26 NOTE — Telephone Encounter (Signed)
I talked with Sharyn Lull. Bronch has been scheduled for 05/31/13 at 7:30am at [facility].

## 2013-05-26 NOTE — Telephone Encounter (Signed)
Left message with Sharyn Lull to call us back to schedule this.

## 2013-05-30 ENCOUNTER — Encounter: Payer: Medicare Other | Attending: Physical Medicine & Rehabilitation | Admitting: Physical Medicine & Rehabilitation

## 2013-05-30 ENCOUNTER — Encounter: Payer: Self-pay | Admitting: Physical Medicine & Rehabilitation

## 2013-05-30 VITALS — BP 158/82 | HR 98 | Resp 14 | Ht 64.0 in | Wt 281.8 lb

## 2013-05-30 DIAGNOSIS — I10 Essential (primary) hypertension: Secondary | ICD-10-CM | POA: Diagnosis not present

## 2013-05-30 DIAGNOSIS — J8410 Pulmonary fibrosis, unspecified: Secondary | ICD-10-CM | POA: Diagnosis not present

## 2013-05-30 DIAGNOSIS — IMO0002 Reserved for concepts with insufficient information to code with codable children: Secondary | ICD-10-CM | POA: Insufficient documentation

## 2013-05-30 DIAGNOSIS — R262 Difficulty in walking, not elsewhere classified: Secondary | ICD-10-CM | POA: Diagnosis not present

## 2013-05-30 DIAGNOSIS — M7062 Trochanteric bursitis, left hip: Secondary | ICD-10-CM

## 2013-05-30 DIAGNOSIS — M069 Rheumatoid arthritis, unspecified: Secondary | ICD-10-CM | POA: Insufficient documentation

## 2013-05-30 DIAGNOSIS — J849 Interstitial pulmonary disease, unspecified: Secondary | ICD-10-CM

## 2013-05-30 DIAGNOSIS — M5416 Radiculopathy, lumbar region: Secondary | ICD-10-CM

## 2013-05-30 DIAGNOSIS — M48061 Spinal stenosis, lumbar region without neurogenic claudication: Secondary | ICD-10-CM | POA: Insufficient documentation

## 2013-05-30 DIAGNOSIS — M76899 Other specified enthesopathies of unspecified lower limb, excluding foot: Secondary | ICD-10-CM | POA: Insufficient documentation

## 2013-05-30 DIAGNOSIS — R609 Edema, unspecified: Secondary | ICD-10-CM | POA: Insufficient documentation

## 2013-05-30 DIAGNOSIS — R600 Localized edema: Secondary | ICD-10-CM | POA: Insufficient documentation

## 2013-05-30 MED ORDER — MORPHINE SULFATE 15 MG PO TABS: 15.0000 mg | ORAL_TABLET | Freq: Four times a day (QID) | ORAL | Status: DC | PRN

## 2013-05-30 NOTE — Progress Notes (Signed)
Subjective:    Patient ID: [name], female    DOB: 04-15-1941, 73 y.o.   MRN: [id number]  HPI  [name] is back regarding her chronic pain. She has continued to struggle with her pulmonary issues. Interstitial lung disease was found on CT in November. She continues to have SOB, dysphonia, cough.   She has been on numerous meds and treatments for this issues. She will potentially have a bronch soon apparently.   The coughing increased her pain in general, even her lumbar spine. She feels that the left leg has stiffened as well. She has gained weight while on the prednisone.  For pain, her morphine still is helpful as well as her tramadol. She is also taking cymbalta and gabapentin.   Her left knee feels tighter and she has more edema in both legs.   Pain Inventory Average Pain 6 Pain Right Now 6 My pain is intermittent and sharp  In the last 24 hours, has pain interfered with the following? General activity 8 Relation with others 8 Enjoyment of life 8 What TIME of day is your pain at its worst? morning, night Sleep (in general) Fair  Pain is worse with: walking, bending and standing Pain improves with: rest, heat/ice and other Relief from Meds: 8  Mobility walk with assistance use a cane how many minutes can you walk? 8 ability to climb steps?  yes transfers alone  Function retired I need assistance with the following:  meal prep, household duties and shopping  Neuro/Psych numbness tingling trouble walking anxiety  Prior Studies Any changes since last visit?  yes x-rays CT/MRI  Physicians involved in your care Any changes since last visit?  yes   Family History  Problem Relation Age of Onset  . Diabetes Mother   . Hypertension Father   . Diabetes Sister   . Diabetes Brother   . Hypertension Brother   . Heart attack Mother   . Lung cancer Father    History   Social History  . Marital Status: Married    Spouse Name: N/A      Number of Children: N/A  . Years of Education: N/A   Social History Main Topics  . Smoking status: Never Smoker   . Smokeless tobacco: None  . Alcohol Use: No  . Drug Use: No  . Sexual Activity: Not Currently   Other Topics Concern  . None   Social History Narrative  . None   Past Surgical History  Procedure Laterality Date  . Abdominal hysterectomy    . Ovary surgery    . Shoulder surgery    . Brain surgery      Gamma knife 10/13. Needs repeat spring  '14   Past Medical History  Diagnosis Date  . Diabetes mellitus   . Hypertension   . Thyroid disease   . Arthritis     endstage changes bilateral knees/bilateral ankles.   . High cholesterol   . 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 02/17/2012    Continue diplopia, left eye pain and left headaches.    . Depression, reactive   . RA (rheumatoid arthritis)     has been off methotreaxte since 10/13.  . Contusion of left knee     due to fall 1/14.  [name] URI (upper respiratory infection)    BP 158/82  Pulse 98  Resp 14  Ht 5' 4" (1.626 m)  Wt 281 lb 12.8 oz (127.824 kg)  BMI 48.35 kg/m2  SpO2 95%     Review of Systems  Constitutional: Positive for fever, chills, diaphoresis and unexpected weight change.  Respiratory: Positive for cough, shortness of breath and wheezing.   Cardiovascular: Positive for leg swelling.  Gastrointestinal: Positive for nausea.  Genitourinary:       Bladder control problems  Musculoskeletal: Positive for back pain and gait problem.  Neurological: Positive for weakness and numbness.       Tingling  Psychiatric/Behavioral: The patient is nervous/anxious.   All other systems reviewed and are negative.       Objective:   Physical Exam  General: Alert and oriented x 3, No apparent distress  HEENT: Head is normocephalic, atraumatic, PERRLA, EOMI, sclera anicteric, oral mucosa pink and moist, dentition intact, ext ear canals clear,  Neck: Supple without JVD or  lymphadenopathy  Heart: Reg rate and rhythm. No murmurs rubs or gallops  Chest: CTA bilaterally without wheezes, rales, or rhonchi; no distress  Abdomen: Soft, non-tender, non-distended, bowel sounds positive.  Extremities: No clubbing, cyanosis, 2+ edema. Pulses are 2+  Skin: Clean and intact without signs of breakdown  Neuro: Pt is cognitively appropriate with normal insight, memory, and awareness. Cranial nerves 2-12 are intact. Sensory exam is near normal in the legs... Reflexes are 2+ in all 4's. Fine motor coordination is intact. No tremors. Motor function is grossly 4 to 5/5 with pain inhbition. SLR was equivocal.  Musculoskeletal: able to bend at waist to 60 degrees with a lot of effort. Left lumbar paraspinals are tender and feel tight. Extension and right bending remain painful. . She had pain again with palpation of the left greater troch and increase pain with cross leg maneuver. She struggles with transfers. She is antalgic with gait on the right. Mild joint effusion noted right knee. Crepitus in both knees. No gross knee instability. She needed to extra time to stand from sitting.  Psych: Pt's affect is appropriate. Pt is cooperative  Assessment & Plan:   Assessment:  1. Lumbar spinal stenosis with neurogenic claudication.  2. Depression.  3. Diabetes mellitus type 2.  4. Rheumatoid arthritis. Multiple joint involvement including right knee  5. Left greater troch bursitis  6. Interstitial lung disease 7. Bilateral lower extremity edema with associated pain around left calf and knee greater than right today. This certainly could be related to her weight gain, prednisone, and her RA and associated pain dx's but I would hate to miss a DVT given her history.    Plan:  1. Follow up with pulmonary as planned for ? bronch 2. Will check LE venous dopplers given her calf pain, increased edema and respiratory issues 3. MSO4 for breakthrough pain--she is using this appropriately. She may  take this a little more if needed. #60 were filled today 4.Will make a referral for home health PT to address gait, strength, stamina.  5. Continue cymbalta to 17m bid for neuropathic and axial spine pain.   6. Follow up with me in about 2 months. 30 minutes of face to face patient care time were spent during this visit. All questions were encouraged and answered.

## 2013-05-30 NOTE — Patient Instructions (Signed)
PLEASE CALL ME WITH ANY PROBLEMS OR QUESTIONS (#[phone]).      

## 2013-05-31 ENCOUNTER — Ambulatory Visit (HOSPITAL_COMMUNITY)
Admission: RE | Admit: 2013-05-31 | Discharge: 2013-05-31 | Disposition: A | Discharge status: Home / Self Care | Payer: Medicare Other | Source: Ambulatory Visit | Attending: Internal Medicine | Admitting: Internal Medicine

## 2013-05-31 ENCOUNTER — Encounter (HOSPITAL_COMMUNITY)
Admission: RE | Disposition: A | Discharge status: Home / Self Care | Payer: Self-pay | Source: Ambulatory Visit | Attending: Internal Medicine

## 2013-05-31 DIAGNOSIS — J8410 Pulmonary fibrosis, unspecified: Secondary | ICD-10-CM | POA: Diagnosis not present

## 2013-05-31 DIAGNOSIS — M069 Rheumatoid arthritis, unspecified: Secondary | ICD-10-CM | POA: Insufficient documentation

## 2013-05-31 DIAGNOSIS — J40 Bronchitis, not specified as acute or chronic: Secondary | ICD-10-CM | POA: Diagnosis not present

## 2013-05-31 DIAGNOSIS — J849 Interstitial pulmonary disease, unspecified: Secondary | ICD-10-CM

## 2013-05-31 HISTORY — PX: VIDEO BRONCHOSCOPY: SHX5072

## 2013-05-31 LAB — BODY FLUID CELL COUNT WITH DIFFERENTIAL
Eos, Fluid: 8 %
Lymphs, Fluid: 1 %
MONOCYTE-MACROPHAGE-SEROUS FLUID: 5 % — AB (ref 50–90)
Neutrophil Count, Fluid: 86 % — ABNORMAL HIGH (ref 0–25)
Total Nucleated Cell Count, Fluid: 610 cu mm (ref 0–1000)

## 2013-05-31 LAB — PNEUMOCYSTIS JIROVECI SMEAR BY DFA: Pneumocystis jiroveci Ag: NEGATIVE

## 2013-05-31 SURGERY — VIDEO BRONCHOSCOPY WITHOUT FLUORO
Anesthesia: Moderate Sedation | Laterality: Bilateral

## 2013-05-31 MED ORDER — LIDOCAINE HCL (PF) 1 % IJ SOLN
INTRAMUSCULAR | Status: DC | PRN
  Administered 2013-05-31: 6 mL

## 2013-05-31 MED ORDER — FENTANYL CITRATE 0.05 MG/ML IJ SOLN
INTRAMUSCULAR | Status: DC | PRN
  Administered 2013-05-31: 25 ug via INTRAVENOUS
  Administered 2013-05-31: 12.5 ug via INTRAVENOUS

## 2013-05-31 MED ORDER — MIDAZOLAM HCL 5 MG/ML IJ SOLN
INTRAMUSCULAR | Status: AC
  Filled 2013-05-31: qty 2

## 2013-05-31 MED ORDER — LIDOCAINE HCL 2 % EX GEL
CUTANEOUS | Status: DC | PRN
  Administered 2013-05-31: 1

## 2013-05-31 MED ORDER — MIDAZOLAM HCL 10 MG/2ML IJ SOLN
INTRAMUSCULAR | Status: DC | PRN
  Administered 2013-05-31: 1 mg via INTRAVENOUS

## 2013-05-31 MED ORDER — PHENYLEPHRINE HCL 0.25 % NA SOLN
NASAL | Status: DC | PRN
  Administered 2013-05-31: 2 via NASAL

## 2013-05-31 MED ORDER — FENTANYL CITRATE 0.05 MG/ML IJ SOLN
INTRAMUSCULAR | Status: AC
  Filled 2013-05-31: qty 4

## 2013-05-31 NOTE — Progress Notes (Signed)
Video bronchoscopy procedure performed. Bronchial washings intervention performed

## 2013-05-31 NOTE — Op Note (Signed)
Name:  [name] MRN:  [id number] DOB:  02/04/1941  PROCEDURE NOTE  Procedure(s): Flexible bronchoscopy [phone]) Bronchial alveolar lavage [phone]) of the Right Lower Lobe   Indications: ILD, rheumatoid arthritis, immunocompromised  Consent:  Procedure, benefits, risks and alternatives discussed.  Questions answered.  Consent obtained.  Anesthesia:  Moderate Sedation  Procedure summary:  Appropriate equipment was assembled.  The patient was brought to the [place] identified as [name].  Safety timeout was performed. The patient was placed supine on the operating table,  sedation established    After the appropriate level of sedation was assured, flexible video bronchoscope was lubricated and inserted through the endotracheal tube.  Total of > 4m of 1% Lidocaine were administered through the bronchoscope to augment general anesthesia.  Airway examination was performed bilaterally to subsegmental level on the right. Left side was not examined due to cough.  Minimal clear secretions were noted, mucosa appeared normal and no endobronchial lesions were identified.  Bronchial alveolar lavage of the right lower lboe lobe was performed with 120 mL of normal saline and return of 75 mL of fluid, after which the bronchoscope was withdrawn. Returns wwere cloudy grey,. After hemostasis was assure, the bronchoscope was withdrawn.  The patient was extubated in operating room and transferred to recovery room  Impression Normal airway exam on right RLL BAL  Specimens sent: Bronchial alveolar lavage specimen of the  Right lower lobe for cell count , microbiology and cytology.  Complications:  No immediate complications were noted.  Hemodynamic parameters and oxygenation remained stable throughout the procedure.  Estimated blood loss:  None   Dr. MBrand Males M.D., [facility] HospitalC.P Pulmonary and Critical Care Medicine Staff Physician [facility]ulmonary and Critical Care Pager: [phone] If no answer or between  15:00h - 7:00h: call [phone]  05/31/2013 8:26 AM

## 2013-05-31 NOTE — Discharge Instructions (Signed)
Please have someone to drive you home Please be careful with activities for next 24 hours You can eat 2-4 hours after getting home provided you are fully alert, able to cough, and are not nauseated or vomiting and     feel well You are expected to have low grade fever or cough some amount of blood for next 24-48 hours; if this worsens call us IF you are very short of breath or coughing blood or chest pain or not feeling well, call us [phone] anytime or go to emergency room Please call [phone] for followup appointment or we will call you and fix one

## 2013-05-31 NOTE — H&P (Signed)
  I last saw her early dec 2014 for dyspnea wit ILD findings and on immunomodulators. Saw Dr Melvyn Novas 05/04/13 and started PPI and then saw Dr Gwenette Greet 05/24/13 an dgiven abx. Currently well  Risks of pneumothorax, hemothorax, sedation/anesthesia complications such as cardiac or respiratory arrest or hypotension, stroke and bleeding all explained. Benefits of diagnosis but limitations of non-diagnosis also explained. Patient verbalized understanding and wished to proceed.   Phyical exam Non focal   Ok to proceed with bronch   Dr. Brand Males, M.D., [facility].C.P Pulmonary and Critical Care Medicine Staff Physician [place] [facility] and Critical Care Pager: [phone], If no answer or between  15:00h - 7:00h: call [phone]  05/31/2013 8:06 AM

## 2013-06-01 ENCOUNTER — Encounter (HOSPITAL_COMMUNITY): Payer: Self-pay | Admitting: Internal Medicine

## 2013-06-02 LAB — CULTURE, BAL-QUANTITATIVE

## 2013-06-02 LAB — CULTURE, BAL-QUANTITATIVE W GRAM STAIN: Colony Count: 10000

## 2013-06-03 ENCOUNTER — Telehealth: Payer: Self-pay | Admitting: Internal Medicine

## 2013-06-03 DIAGNOSIS — J8410 Pulmonary fibrosis, unspecified: Secondary | ICD-10-CM | POA: Diagnosis not present

## 2013-06-03 DIAGNOSIS — E1129 Type 2 diabetes mellitus with other diabetic kidney complication: Secondary | ICD-10-CM | POA: Diagnosis not present

## 2013-06-03 DIAGNOSIS — N182 Chronic kidney disease, stage 2 (mild): Secondary | ICD-10-CM | POA: Diagnosis not present

## 2013-06-03 NOTE — Telephone Encounter (Signed)
Spoke with pt. She is scheduled to see TP 06/10/13 at 10:30.

## 2013-06-03 NOTE — Progress Notes (Signed)
Dr. Brand Males, M.D., [facility].C.P Pulmonary and Critical Care Medicine Staff Physician [place] [facility] Pager: [phone], If no answer or between  15:00h - 7:00h: call [phone]  06/03/2013 6:22 PM

## 2013-06-03 NOTE — Telephone Encounter (Signed)
Give her fu with me or Tammy next 1-2 weeks to discuss bronch bal results; ; let me know followup date  Dr. Brand Males, M.D., [facility].C.P Pulmonary and Critical Care Medicine Staff Physician [place] [facility] Pager: [phone], If no answer or between  15:00h - 7:00h: call [phone]  06/03/2013 12:50 PM

## 2013-06-05 LAB — LEGIONELLA CULTURE

## 2013-06-10 ENCOUNTER — Telehealth: Payer: Self-pay | Admitting: Internal Medicine

## 2013-06-10 ENCOUNTER — Ambulatory Visit (INDEPENDENT_AMBULATORY_CARE_PROVIDER_SITE_OTHER): Payer: Medicare Other | Admitting: Adult Health

## 2013-06-10 ENCOUNTER — Encounter: Payer: Self-pay | Admitting: Adult Health

## 2013-06-10 VITALS — BP 124/76 | HR 96 | Temp 98.5°F | Ht 64.0 in | Wt 283.8 lb

## 2013-06-10 DIAGNOSIS — J849 Interstitial pulmonary disease, unspecified: Secondary | ICD-10-CM

## 2013-06-10 DIAGNOSIS — J8410 Pulmonary fibrosis, unspecified: Secondary | ICD-10-CM | POA: Diagnosis not present

## 2013-06-10 MED ORDER — AEROCHAMBER MV MISC: Status: DC

## 2013-06-10 NOTE — Progress Notes (Signed)
Subjective:    Patient ID: [name], female    DOB: 1940-10-07, 73 y.o.   MRN: [id number]  HPI  PCP Maximino Greenland, MD Rehum - Dr Estanislado Pandy Cards - Dr Woody Seller at [facility] Cardiology  HPI  IOV 04/06/2013  Chief Complaint  Patient presents with  . Pulmonary Consult    for SOb x 2 months.    73 year-old morbidly obese [name] female, nonsmoker. History of rheumatoid arthritis for several years. Referred for dyspnea  Background rheumatoid arthritis history  - Noted arthritis present for several years. Mostly affecting the joints. Follows with rheumatologist. SHe used to be on remicade with MTx 5-6 years ago. Then on Orencia with methotrexate. Last few years MTXaline and finally due to worsening jt disesae MTx stopped and she is on actmera with prednisone, colchicine, Plaquenil along with other pain medications of Neurontin, Cymbalta, morphine and Tylenol    Current history  - She is now reporting 2 months of insidious onset of shortness of breath. Several months ago she was seen by cardiologist for edema and was apparently told her heart was fine. Current dyspnea is not progressive but is of moderate to severe in intensity. Worsened by exertion relieved by rest but all the present even at rest. Changing clothes makes her dyspneic. Improved by staying still. No suicide orthopnea, chest pain. Currently the edema status is improved she says.   Dyspnea relevant Hx  - Walk test 185 feet x3 laps on room air today 04/06/2013. She only completed 2 laps and got very dyspneic and fatigued. The heart rate was 111. She desaturated to 88%  - Recollects history bronchoscopy several years ago by Dr. Asencion Noble I do not have these results with him he   -  reports that she has never smoked. She does not have any smokeless tobacco history on file.   - Body mass index is 47.39 kg/(m^2).  - Lab is rheumatologist 03/14/2013 shows a CBC white cell count of 3.7-10% used to  flows. Creatinine of 0.99 mg percent  CT 03/13/04  IMPRESSION  1. No evidence of pulmonary embolism or other acute disease.  2. Chronic bibasilar interstitial changes and shotty hilar and mediastinal lymph nodes, which are  nonspecific.  Provider: [name]  OV 04/28/2013   Chief Complaint  Patient presents with  . Follow-up    Pt c/o SOB with exertion, hoarseness, wheezing.  Pt denies any congestion.  CT shows ILD. WTIh her hx of RA and  being on iimmuniosuppressive drugs: ddx is this is RA or opportunistic infection. SHe will need bronch with BAL.[name] So need quicker followup than 05/11/13 preferred with me in dec 2015 by mid-dec 2014. IF that is not poissioble, have her see TP the NP, so I Can set bronch through her. She needs to finish her other tests as well   ono 04/11/13 shows lowerst pulse ox 79% and total time </= 89% at 6 min and </= 88% at 3 min. She has very mild desat. Does not qualify for overnight oxygen  Pulmonary function test 04/28/2013 shows FEV1 1.7 L/74%. FEV1 11.46 L/81%. Ratio to 84/109%. Suggestive of restriction. Total lung capacity is 2.9 L/58% and consistent with restriction.  To discuss results: does not want portable o2. Stable dyspnea. No change in health. She is agreeable to bronch for BAL - ddx opportunistic infetin v RA-ILD. Too high risk for transbronchial bx + low yield   05/04/2013  Acute  ov/[name] re:  ILD/ acute sob  x 2 days and RA worse  Chief Complaint  Patient presents with  . Acute Visit    Pt c/o increased SOB and non prod cough x 2 days. She is using rescue inhaler at least 2-3 times per day.     Inhaler really helps Reduced pred to 5 mg daily x sev weeks prior to flare and arthritis worse when breathing worse  No real change in cough freq/ intensity since sob worse   >>pred burst   06/10/2013 Follow up  Returns for follow up  Had recent bronchitic flare 2 weeks ago , tx w/ Levaquin . Feeling better w/ resolved cough  Had FOB on  05/31/13  that showed inflammatory cells, no malignant cells. Cx non path flora .  On prednisone 72m daily .  She is feeling better , nearing her baseline.  No chest pain, orthopnea, hemoptysis, or increased edema.      Current Medications, Allergies, Complete Past Medical History, Past Surgical History, Family History, and Social History were reviewed in [employer].  ROS  The following are not active complaints unless bolded sore throat, dysphagia, dental problems, itching, sneezing,  nasal congestion or excess/ purulent secretions, ear ache,   fever, chills, sweats, unintended wt loss, pleuritic or exertional cp, hemoptysis,  orthopnea pnd or leg swelling, presyncope, palpitations, heartburn, abdominal pain, anorexia, nausea, vomiting, diarrhea  or change in bowel or urinary habits, change in stools or urine, dysuria,hematuria,  rash,  visual complaints, headache, numbness weakness or ataxia or problems with walking or coordination,  change in mood/affect or memory.           Objective:   Physical Exam    HEENT: nl dentition, turbinates, and orophanx. Nl external ear canals without cough reflex   NECK :  without JVD/Nodes/TM/ nl carotid upstrokes bilaterally   LUNGS: no acc muscle use, clear to A and P bilaterally without cough on insp or exp maneuvers   CV:  RRR  no s3 or murmur or increase in P2, tr  edema   ABD:  soft and nontender with nl excursion in the supine position. No bruits or organomegaly, bowel sounds nl  MS:  warm without deformities, calf tenderness, cyanosis or clubbing  SKIN: warm and dry without lesions    NEURO:  alert, approp, no deficits        Assessment & Plan:

## 2013-06-10 NOTE — Patient Instructions (Addendum)
May decrease Prednisone 16m daily  We are referring you to pulmonary rehab.  May restart RA infusions as directed by Rheumatology.  Follow up Dr. RChase Callerin 6 months with PFT  Please contact office for sooner follow up if symptoms do not improve or worsen or seek emergency care.

## 2013-06-10 NOTE — Telephone Encounter (Signed)
[name] are seeing her today at 10.30am. 06/10/2013. BAL shows lot of neutrophils and ddx is acut viral/bacterial infection ([place] had just completed abx Rx) or aspiration (please check on this). Main thing is no opportunisitc infection given her extensive immunosuppressive regimen. AT this time I wil not recommend change to her immunosuppressive regiment but would recommend rehab for ILD, and weight loss. Fu with me with PFT in 6 months  Thanks  Dr. Brand Males, M.D., [facility].C.P Pulmonary and Critical Care Medicine Staff Physician [place] [facility] Pager: [phone], If no answer or between  15:00h - 7:00h: call [phone]  06/10/2013 10:08 AM

## 2013-06-10 NOTE — Assessment & Plan Note (Signed)
Recent flare w/ bronchitis now resolved.  FOB w/ no malignant cells or opportunistic germs noted.  Will follow lung fxn and serial CT  Would like for her to join pulmonary rehab to help with deconditioning and obesity.   Plan  May decrease Prednisone 30m daily  We are referring you to pulmonary rehab.  May restart RA infusions as directed by Rheumatology.  Follow up Dr. RChase Callerin 6 months with PFT  Please contact office for sooner follow up if symptoms do not improve or worsen or seek emergency care.

## 2013-06-13 DIAGNOSIS — Z09 Encounter for follow-up examination after completed treatment for conditions other than malignant neoplasm: Secondary | ICD-10-CM | POA: Diagnosis not present

## 2013-06-13 DIAGNOSIS — M109 Gout, unspecified: Secondary | ICD-10-CM | POA: Diagnosis not present

## 2013-06-13 DIAGNOSIS — J84115 Respiratory bronchiolitis interstitial lung disease: Secondary | ICD-10-CM | POA: Diagnosis not present

## 2013-06-13 DIAGNOSIS — M069 Rheumatoid arthritis, unspecified: Secondary | ICD-10-CM | POA: Diagnosis not present

## 2013-06-17 ENCOUNTER — Ambulatory Visit: Payer: Medicare Other | Admitting: Internal Medicine

## 2013-06-21 DIAGNOSIS — M069 Rheumatoid arthritis, unspecified: Secondary | ICD-10-CM | POA: Diagnosis not present

## 2013-06-21 DIAGNOSIS — Z79899 Other long term (current) drug therapy: Secondary | ICD-10-CM | POA: Diagnosis not present

## 2013-06-28 DIAGNOSIS — Z79899 Other long term (current) drug therapy: Secondary | ICD-10-CM | POA: Diagnosis not present

## 2013-06-28 LAB — FUNGUS CULTURE W SMEAR: Fungal Smear: NONE SEEN

## 2013-06-29 DIAGNOSIS — H04129 Dry eye syndrome of unspecified lacrimal gland: Secondary | ICD-10-CM | POA: Diagnosis not present

## 2013-06-29 DIAGNOSIS — E119 Type 2 diabetes mellitus without complications: Secondary | ICD-10-CM | POA: Diagnosis not present

## 2013-06-29 DIAGNOSIS — Z79899 Other long term (current) drug therapy: Secondary | ICD-10-CM | POA: Diagnosis not present

## 2013-06-29 DIAGNOSIS — H2510 Age-related nuclear cataract, unspecified eye: Secondary | ICD-10-CM | POA: Diagnosis not present

## 2013-06-29 DIAGNOSIS — H43819 Vitreous degeneration, unspecified eye: Secondary | ICD-10-CM | POA: Diagnosis not present

## 2013-06-29 DIAGNOSIS — H25019 Cortical age-related cataract, unspecified eye: Secondary | ICD-10-CM | POA: Diagnosis not present

## 2013-06-29 DIAGNOSIS — H524 Presbyopia: Secondary | ICD-10-CM | POA: Diagnosis not present

## 2013-06-29 DIAGNOSIS — M069 Rheumatoid arthritis, unspecified: Secondary | ICD-10-CM | POA: Diagnosis not present

## 2013-07-13 LAB — AFB CULTURE WITH SMEAR (NOT AT ARMC): ACID FAST SMEAR: NONE SEEN

## 2013-07-15 DIAGNOSIS — J40 Bronchitis, not specified as acute or chronic: Secondary | ICD-10-CM | POA: Diagnosis not present

## 2013-07-15 DIAGNOSIS — J8410 Pulmonary fibrosis, unspecified: Secondary | ICD-10-CM | POA: Diagnosis not present

## 2013-07-15 DIAGNOSIS — Z79899 Other long term (current) drug therapy: Secondary | ICD-10-CM | POA: Diagnosis not present

## 2013-07-18 ENCOUNTER — Ambulatory Visit (INDEPENDENT_AMBULATORY_CARE_PROVIDER_SITE_OTHER): Payer: Medicare Other | Admitting: Internal Medicine

## 2013-07-18 ENCOUNTER — Encounter: Payer: Self-pay | Admitting: Internal Medicine

## 2013-07-18 VITALS — BP 130/86 | HR 98 | Ht 64.0 in | Wt 290.4 lb

## 2013-07-18 DIAGNOSIS — J209 Acute bronchitis, unspecified: Secondary | ICD-10-CM

## 2013-07-18 DIAGNOSIS — J8410 Pulmonary fibrosis, unspecified: Secondary | ICD-10-CM

## 2013-07-18 DIAGNOSIS — J849 Interstitial pulmonary disease, unspecified: Secondary | ICD-10-CM

## 2013-07-18 MED ORDER — DOXYCYCLINE HYCLATE 100 MG PO TABS: 100.0000 mg | ORAL_TABLET | Freq: Two times a day (BID) | ORAL | Status: DC

## 2013-07-18 MED ORDER — PREDNISONE 10 MG PO TABS: ORAL_TABLET | ORAL | Status: DC

## 2013-07-18 NOTE — Patient Instructions (Addendum)
#  Acute bronchitis Stop levaquin  Take doxycycline 100mg po twice daily x 5 days; take after meals and avoid sunlight Take prednisone 40 mg daily x 2 days, then 30mg daily x 2 days, then 20mg daily x 2 days, and then to continue at 10mg per day IF not better, come sooner - might need cxr or scan given usage of Actmera we need to be concerned about opportunisitc infection  #Chest tightness  - stress test 2013 for heart was normal; need to keep an eye on this  #Followup  2 months with full PFT (instead of 6 months from Jan 2015 as previously stated) Consider bactrim for pcp prophylaxis at followup 

## 2013-07-18 NOTE — Progress Notes (Signed)
Subjective:    Patient ID: [name], female    DOB: 02-Aug-1940, 73 y.o.   MRN: [id number]  HPI   PCP Joan Greenland, MD [facility] - Dr Estanislado Pandy Cards - Dr Woody Seller at [facility] Cardiology  HPI  IOV 04/06/2013  Chief Complaint  Patient presents with  . Pulmonary Consult    for SOb x 2 months.    73 year-old morbidly obese [name] female, nonsmoker. History of rheumatoid arthritis for several years. Referred for dyspnea  Background rheumatoid arthritis history  - Noted arthritis present for several years. Mostly affecting the joints. Follows with rheumatologist. SHe used to be on remicade with MTx 5-6 years ago. Then on Orencia with methotrexate. Last few years MTXaline and finally due to worsening jt disesae MTx stopped and she is on actmera with prednisone, colchicine, Plaquenil along with other pain medications of Neurontin, Cymbalta, morphine and Tylenol    Current history  - She is now reporting 2 months of insidious onset of shortness of breath. Several months ago she was seen by cardiologist for edema and was apparently told her heart was fine. Current dyspnea is not progressive but is of moderate to severe in intensity. Worsened by exertion relieved by rest but all the present even at rest. Changing clothes makes her dyspneic. Improved by staying still. No suicide orthopnea, chest pain. Currently the edema status is improved she says.   Dyspnea relevant Hx  - Walk test 185 feet x3 laps on room air today 04/06/2013. She only completed 2 laps and got very dyspneic and fatigued. The heart rate was 111. She desaturated to 88%  - Recollects history bronchoscopy several years ago by Dr. Asencion Noble I do not have these results with him he   -  reports that she has never smoked. She does not have any smokeless tobacco history on file.   - Body mass index is 47.39 kg/(m^2).  - Lab is rheumatologist 03/14/2013 shows a CBC white cell count of 3.7-10% used to  flows. Creatinine of 0.99 mg percent  CT 03/13/04  IMPRESSION  1. No evidence of pulmonary embolism or other acute disease.  2. Chronic bibasilar interstitial changes and shotty hilar and mediastinal lymph nodes, which are  nonspecific.  Provider: [name]  OV 04/28/2013   Chief Complaint  Patient presents with  . Follow-up    Pt c/o SOB with exertion, hoarseness, wheezing.  Pt denies any congestion.     CT shows ILD. WTIh her hx of RA and  being on iimmuniosuppressive drugs: ddx is this is RA or opportunistic infection. SHe will need bronch with BAL.[name] So need quicker followup than 05/11/13 preferred with me in dec 2015 by mid-dec 2014. IF that is not poissioble, have her see TP the NP, so I Can set bronch through her. She needs to finish her other tests as well   ono 04/11/13 shows lowerst pulse ox 79% and total time </= 89% at 6 min and </= 88% at 3 min. She has very mild desat. Does not qualify for overnight oxygen  Pulmonary function test 04/28/2013 shows FEV1 1.7 L/74%. FEV1 11.46 L/81%. Ratio to 84/109%. Suggestive of restriction. Total lung capacity is 2.9 L/58% and consistent with restriction.  To discuss results: does not want portable o2. Stable dyspnea. No change in health. She is agreeable to bronch for BAL - ddx opportunistic infetin v RA-ILD. Too high risk for transbronchial bx + low yield   05/04/2013  Acute  ov/[name] re:  ILD/ acute sob x 2 days and RA worse  Chief Complaint  Patient presents with  . Acute Visit    Pt c/o increased SOB and non prod cough x 2 days. She is using rescue inhaler at least 2-3 times per day.     Inhaler really helps Reduced pred to 5 mg daily x sev weeks prior to flare and arthritis worse when breathing worse  No real change in cough freq/ intensity since sob worse   No obvious day to day or daytime variabilty or assoc chronic cough or cp or chest tightness, subjective wheeze overt sinus or hb symptoms. No unusual exp hx or h/o  childhood pna/ asthma or knowledge of premature birth.  Sleeping ok without nocturnal  or early am exacerbation  of respiratory  c/o's or need for noct saba. Also denies any obvious fluctuation of symptoms with weather or environmental changes or other aggravating or alleviating factors except as outlined above   Telep[ehone call 06/10/13  BAL shows lot of neutrophils and ddx is acut viral/bacterial infection ([place] had just completed abx Rx) or aspiration (please check on this). Main thing is no opportunisitc infection given her extensive immunosuppressive regimen. AT this time I wil not recommend change to her immunosuppressive regiment but would recommend rehab for ILD, and weight loss. Fu with me with PFT in 6 months  06/10/2013 Follow up  Returns for follow up  Had recent bronchitic flare 2 weeks ago , tx w/ Levaquin . Feeling better w/ resolved cough  Had FOB on 05/31/13  that showed inflammatory cells, no malignant cells. Cx non path flora .  On prednisone 34m daily .  She is feeling better , nearing her baseline.  No chest pain, orthopnea, hemoptysis, or increased edema.   REC May decrease Prednisone 541mdaily  We are referring you to pulmonary rehab.  May restart RA infusions as directed by Rheumatology.  Follow up Dr. RaChase Callern 6 months with PFT  Please contact office for sooner follow up if symptoms do not improve or worsen or seek emergency care.   OV 07/18/2013  Chief Complaint  Patient presents with  . Acute Visit    Pt c/o having SOB, chest tightness, wheezing, productive cough wiht yellow phlegm x 2 weeks. Pt states she was given a rochephin injection and levaquin rx by PCP.    Now on prednisone 1053mlaquenil and Actermia infusion for RA  Past 1 week having head cold, chest tighness, cough, yellow sputum, some malaise. Given rocephin at [facility] PCP [facility] office and now on levaquin but no better. SO, came here. DEnies fever, n, v, d. HAs chronic ches tighness  with exertion that bother her  But stress test 2013 was negative   Review of Systems  Constitutional: Negative for fever and unexpected weight change.  HENT: Negative for congestion, dental problem, ear pain, nosebleeds, postnasal drip, rhinorrhea, sinus pressure, sneezing, sore throat and trouble swallowing.   Eyes: Negative for redness and itching.  Respiratory: Positive for cough, choking, chest tightness and wheezing. Negative for shortness of breath.   Cardiovascular: Negative for palpitations and leg swelling.  Gastrointestinal: Negative for nausea and vomiting.  Genitourinary: Negative for dysuria.  Musculoskeletal: Negative for joint swelling.  Skin: Negative for rash.  Neurological: Negative for headaches.  Hematological: Does not bruise/bleed easily.  Psychiatric/Behavioral: Negative for dysphoric mood. The patient is not nervous/anxious.        Objective:   Physical Exam  Filed Vitals:   07/18/13 1411  BP: 130/86  Pulse: 98  Height: 5' 4" (1.626 m)  Weight: 131.725 kg (290 lb 6.4 oz)  SpO2: 90%    chrnically ill looking, obese  HEENT: nl dentition, turbinates, and orophanx. Nl external ear canals without cough reflex   NECK :  without JVD/Nodes/TM/ nl carotid upstrokes bilaterally   LUNGS: no acc muscle use, clear to A and P bilaterally without cough on insp or exp maneuvers   CV:  RRR  no s3 or murmur or increase in P2, tr  edema   ABD:  soft and nontender with nl excursion in the supine position. No bruits or organomegaly, bowel sounds nl  MS:  warm without deformities, calf tenderness, cyanosis or clubbing  SKIN: warm and dry without lesions    NEURO:  alert, approp, no deficits        Assessment & Plan:

## 2013-07-24 DIAGNOSIS — J209 Acute bronchitis, unspecified: Secondary | ICD-10-CM | POA: Insufficient documentation

## 2013-07-24 NOTE — Assessment & Plan Note (Signed)
Given infection, better to reassess ild status sooner than originally planned. Will see in 2 months with PFT

## 2013-07-24 NOTE — Assessment & Plan Note (Signed)
#  Acute bronchitis Stop levaquin  Take doxycycline 154m po twice daily x 5 days; take after meals and avoid sunlight Take prednisone 40 mg daily x 2 days, then 393mdaily x 2 days, then 2030maily x 2 days, and then to continue at 8m21mr day IF not better, come sooner - might need cxr or scan given usage of Actmera we need to be concerned about opportunisitc infection  #Chest tightness  - stress test 2013 for heart was normal; need to keep an eye on this  #Followup  2 months with full PFT (instead of 6 months from Jan 2015 as previously stated) Consider bactrim for pcp prophylaxis at followup

## 2013-07-27 ENCOUNTER — Encounter: Payer: Self-pay | Admitting: Physical Medicine & Rehabilitation

## 2013-07-27 ENCOUNTER — Encounter: Payer: Medicare Other | Attending: Physical Medicine & Rehabilitation | Admitting: Physical Medicine & Rehabilitation

## 2013-07-27 VITALS — BP 164/85 | HR 90 | Resp 14 | Ht 64.0 in | Wt 284.0 lb

## 2013-07-27 DIAGNOSIS — M5416 Radiculopathy, lumbar region: Secondary | ICD-10-CM

## 2013-07-27 DIAGNOSIS — M76899 Other specified enthesopathies of unspecified lower limb, excluding foot: Secondary | ICD-10-CM | POA: Diagnosis not present

## 2013-07-27 DIAGNOSIS — M069 Rheumatoid arthritis, unspecified: Secondary | ICD-10-CM | POA: Insufficient documentation

## 2013-07-27 DIAGNOSIS — M7062 Trochanteric bursitis, left hip: Secondary | ICD-10-CM

## 2013-07-27 DIAGNOSIS — I10 Essential (primary) hypertension: Secondary | ICD-10-CM | POA: Insufficient documentation

## 2013-07-27 DIAGNOSIS — IMO0002 Reserved for concepts with insufficient information to code with codable children: Secondary | ICD-10-CM | POA: Diagnosis not present

## 2013-07-27 DIAGNOSIS — M48061 Spinal stenosis, lumbar region without neurogenic claudication: Secondary | ICD-10-CM | POA: Insufficient documentation

## 2013-07-27 MED ORDER — TRAMADOL HCL 50 MG PO TABS: 50.0000 mg | ORAL_TABLET | ORAL | Status: DC | PRN

## 2013-07-27 MED ORDER — MORPHINE SULFATE 15 MG PO TABS: 15.0000 mg | ORAL_TABLET | Freq: Four times a day (QID) | ORAL | Status: DC | PRN

## 2013-07-27 NOTE — Patient Instructions (Signed)
Low Back Strain with Rehab A strain is an injury in which a tendon or muscle is torn. The muscles and tendons of the lower back are vulnerable to strains. However, these muscles and tendons are very strong and require a great force to be injured. Strains are classified into three categories. Grade 1 strains cause pain, but the tendon is not lengthened. Grade 2 strains include a lengthened ligament, due to the ligament being stretched or partially ruptured. With grade 2 strains there is still function, although the function may be decreased. Grade 3 strains involve a complete tear of the tendon or muscle, and function is usually impaired. SYMPTOMS   Pain in the lower back.  Pain that affects one side more than the other.  Pain that gets worse with movement and may be felt in the hip, buttocks, or back of the thigh.  Muscle spasms of the muscles in the back.  Swelling along the muscles of the back.  Loss of strength of the back muscles.  Crackling sound (crepitation) when the muscles are touched. CAUSES  Lower back strains occur when a force is placed on the muscles or tendons that is greater than they can handle. Common causes of injury include:  Prolonged overuse of the muscle-tendon units in the lower back, usually from incorrect posture.  A single violent injury or force applied to the back. RISK INCREASES WITH:  Sports that involve twisting forces on the spine or a lot of bending at the waist (football, rugby, weightlifting, bowling, golf, tennis, speed skating, racquetball, swimming, running, gymnastics, diving).  Poor strength and flexibility.  Failure to warm up properly before activity.  Family history of lower back pain or disk disorders.  Previous back injury or surgery (especially fusion).  Poor posture with lifting, especially heavy objects.  Prolonged sitting, especially with poor posture. PREVENTION   Learn and use proper posture when sitting or lifting (maintain  proper posture when sitting, lift using the knees and legs, not at the waist).  Warm up and stretch properly before activity.  Allow for adequate recovery between workouts.  Maintain physical fitness:  Strength, flexibility, and endurance.  Cardiovascular fitness. PROGNOSIS  If treated properly, lower back strains usually heal within 6 weeks. RELATED COMPLICATIONS   Recurring symptoms, resulting in a chronic problem.  Chronic inflammation, scarring, and partial muscle-tendon tear.  Delayed healing or resolution of symptoms.  Prolonged disability. TREATMENT  Treatment first involves the use of ice and medicine, to reduce pain and inflammation. The use of strengthening and stretching exercises may help reduce pain with activity. These exercises may be performed at home or with a therapist. Severe injuries may require referral to a therapist for further evaluation and treatment, such as ultrasound. Your caregiver may advise that you wear a back brace or corset, to help reduce pain and discomfort. Often, prolonged bed rest results in greater harm then benefit. Corticosteroid injections may be recommended. However, these should be reserved for the most serious cases. It is important to avoid using your back when lifting objects. At night, sleep on your back on a firm mattress with a pillow placed under your knees. If non-surgical treatment is unsuccessful, surgery may be needed.  MEDICATION   If pain medicine is needed, nonsteroidal anti-inflammatory medicines (aspirin and ibuprofen), or other minor pain relievers (acetaminophen), are often advised.  Do not take pain medicine for 7 days before surgery.  Prescription pain relievers may be given, if your caregiver thinks they are needed. Use only as  directed and only as much as you need.  Ointments applied to the skin may be helpful.  Corticosteroid injections may be given by your caregiver. These injections should be reserved for the most  serious cases, because they may only be given a certain number of times. HEAT AND COLD  Cold treatment (icing) should be applied for 10 to 15 minutes every 2 to 3 hours for inflammation and pain, and immediately after activity that aggravates your symptoms. Use ice packs or an ice massage.  Heat treatment may be used before performing stretching and strengthening activities prescribed by your caregiver, physical therapist, or athletic trainer. Use a heat pack or a warm water soak. SEEK MEDICAL CARE IF:   Symptoms get worse or do not improve in 2 to 4 weeks, despite treatment.  You develop numbness, weakness, or loss of bowel or bladder function.  New, unexplained symptoms develop. (Drugs used in treatment may produce side effects.) EXERCISES  RANGE OF MOTION (ROM) AND STRETCHING EXERCISES - Low Back Strain Most people with lower back pain will find that their symptoms get worse with excessive bending forward (flexion) or arching at the lower back (extension). The exercises which will help resolve your symptoms will focus on the opposite motion.  Your physician, physical therapist or athletic trainer will help you determine which exercises will be most helpful to resolve your lower back pain. Do not complete any exercises without first consulting with your caregiver. Discontinue any exercises which make your symptoms worse until you speak to your caregiver.  If you have pain, numbness or tingling which travels down into your buttocks, leg or foot, the goal of the therapy is for these symptoms to move closer to your back and eventually resolve. Sometimes, these leg symptoms will get better, but your lower back pain may worsen. This is typically an indication of progress in your rehabilitation. Be very alert to any changes in your symptoms and the activities in which you participated in the 24 hours prior to the change. Sharing this information with your caregiver will allow him/her to most efficiently  treat your condition.  These exercises may help you when beginning to rehabilitate your injury. Your symptoms may resolve with or without further involvement from your physician, physical therapist or athletic trainer. While completing these exercises, remember:  Restoring tissue flexibility helps normal motion to return to the joints. This allows healthier, less painful movement and activity.  An effective stretch should be held for at least 30 seconds.  A stretch should never be painful. You should only feel a gentle lengthening or release in the stretched tissue. FLEXION RANGE OF MOTION AND STRETCHING EXERCISES: STRETCH  Flexion, Single Knee to Chest   Lie on a firm bed or floor with both legs extended in front of you.  Keeping one leg in contact with the floor, bring your opposite knee to your chest. Hold your leg in place by either grabbing behind your thigh or at your knee.  Pull until you feel a gentle stretch in your lower back. Hold __________ seconds.  Slowly release your grasp and repeat the exercise with the opposite side. Repeat __________ times. Complete this exercise __________ times per day.  STRETCH  Flexion, Double Knee to Chest   Lie on a firm bed or floor with both legs extended in front of you.  Keeping one leg in contact with the floor, bring your opposite knee to your chest.  Tense your stomach muscles to support your back and then  lift your other knee to your chest. Hold your legs in place by either grabbing behind your thighs or at your knees.  Pull both knees toward your chest until you feel a gentle stretch in your lower back. Hold __________ seconds.  Tense your stomach muscles and slowly return one leg at a time to the floor. Repeat __________ times. Complete this exercise __________ times per day.  STRETCH  Low Trunk Rotation  Lie on a firm bed or floor. Keeping your legs in front of you, bend your knees so they are both pointed toward the ceiling and  your feet are flat on the floor.  Extend your arms out to the side. This will stabilize your upper body by keeping your shoulders in contact with the floor.  Gently and slowly drop both knees together to one side until you feel a gentle stretch in your lower back. Hold for __________ seconds.  Tense your stomach muscles to support your lower back as you bring your knees back to the starting position. Repeat the exercise to the other side. Repeat __________ times. Complete this exercise __________ times per day  EXTENSION RANGE OF MOTION AND FLEXIBILITY EXERCISES: STRETCH  Extension, Prone on Elbows   Lie on your stomach on the floor, a bed will be too soft. Place your palms about shoulder width apart and at the height of your head.  Place your elbows under your shoulders. If this is too painful, stack pillows under your chest.  Allow your body to relax so that your hips drop lower and make contact more completely with the floor.  Hold this position for __________ seconds.  Slowly return to lying flat on the floor. Repeat __________ times. Complete this exercise __________ times per day.  RANGE OF MOTION  Extension, Prone Press Ups  Lie on your stomach on the floor, a bed will be too soft. Place your palms about shoulder width apart and at the height of your head.  Keeping your back as relaxed as possible, slowly straighten your elbows while keeping your hips on the floor. You may adjust the placement of your hands to maximize your comfort. As you gain motion, your hands will come more underneath your shoulders.  Hold this position __________ seconds.  Slowly return to lying flat on the floor. Repeat __________ times. Complete this exercise __________ times per day.  RANGE OF MOTION- Quadruped, Neutral Spine   Assume a hands and knees position on a firm surface. Keep your hands under your shoulders and your knees under your hips. You may place padding under your knees for  comfort.  Drop your head and point your tail bone toward the ground below you. This will round out your lower back like an angry cat. Hold this position for __________ seconds.  Slowly lift your head and release your tail bone so that your back sags into a large arch, like an old horse.  Hold this position for __________ seconds.  Repeat this until you feel limber in your lower back.  Now, find your "sweet spot." This will be the most comfortable position somewhere between the two previous positions. This is your neutral spine. Once you have found this position, tense your stomach muscles to support your lower back.  Hold this position for __________ seconds. Repeat __________ times. Complete this exercise __________ times per day.  STRENGTHENING EXERCISES - Low Back Strain These exercises may help you when beginning to rehabilitate your injury. These exercises should be done near your "sweet  spot." This is the neutral, low-back arch, somewhere between fully rounded and fully arched, that is your least painful position. When performed in this safe range of motion, these exercises can be used for people who have either a flexion or extension based injury. These exercises may resolve your symptoms with or without further involvement from your physician, physical therapist or athletic trainer. While completing these exercises, remember:   Muscles can gain both the endurance and the strength needed for everyday activities through controlled exercises.  Complete these exercises as instructed by your physician, physical therapist or athletic trainer. Increase the resistance and repetitions only as guided.  You may experience muscle soreness or fatigue, but the pain or discomfort you are trying to eliminate should never worsen during these exercises. If this pain does worsen, stop and make certain you are following the directions exactly. If the pain is still present after adjustments, discontinue the  exercise until you can discuss the trouble with your caregiver. STRENGTHENING Deep Abdominals, Pelvic Tilt  Lie on a firm bed or floor. Keeping your legs in front of you, bend your knees so they are both pointed toward the ceiling and your feet are flat on the floor.  Tense your lower abdominal muscles to press your lower back into the floor. This motion will rotate your pelvis so that your tail bone is scooping upwards rather than pointing at your feet or into the floor.  With a gentle tension and even breathing, hold this position for __________ seconds. Repeat __________ times. Complete this exercise __________ times per day.  STRENGTHENING  Abdominals, Crunches   Lie on a firm bed or floor. Keeping your legs in front of you, bend your knees so they are both pointed toward the ceiling and your feet are flat on the floor. Cross your arms over your chest.  Slightly tip your chin down without bending your neck.  Tense your abdominals and slowly lift your trunk high enough to just clear your shoulder blades. Lifting higher can put excessive stress on the lower back and does not further strengthen your abdominal muscles.  Control your return to the starting position. Repeat __________ times. Complete this exercise __________ times per day.  STRENGTHENING  Quadruped, Opposite UE/LE Lift   Assume a hands and knees position on a firm surface. Keep your hands under your shoulders and your knees under your hips. You may place padding under your knees for comfort.  Find your neutral spine and gently tense your abdominal muscles so that you can maintain this position. Your shoulders and hips should form a rectangle that is parallel with the floor and is not twisted.  Keeping your trunk steady, lift your right hand no higher than your shoulder and then your left leg no higher than your hip. Make sure you are not holding your breath. Hold this position __________ seconds.  Continuing to keep your  abdominal muscles tense and your back steady, slowly return to your starting position. Repeat with the opposite arm and leg. Repeat __________ times. Complete this exercise __________ times per day.  STRENGTHENING  Lower Abdominals, Double Knee Lift  Lie on a firm bed or floor. Keeping your legs in front of you, bend your knees so they are both pointed toward the ceiling and your feet are flat on the floor.  Tense your abdominal muscles to brace your lower back and slowly lift both of your knees until they come over your hips. Be certain not to hold your breath.  Hold __________ seconds. Using your abdominal muscles, return to the starting position in a slow and controlled manner. Repeat __________ times. Complete this exercise __________ times per day.  POSTURE AND BODY MECHANICS CONSIDERATIONS - Low Back Strain Keeping correct posture when sitting, standing or completing your activities will reduce the stress put on different body tissues, allowing injured tissues a chance to heal and limiting painful experiences. The following are general guidelines for improved posture. Your physician or physical therapist will provide you with any instructions specific to your needs. While reading these guidelines, remember:  The exercises prescribed by your provider will help you have the flexibility and strength to maintain correct postures.  The correct posture provides the best environment for your joints to work. All of your joints have less wear and tear when properly supported by a spine with good posture. This means you will experience a healthier, less painful body.  Correct posture must be practiced with all of your activities, especially prolonged sitting and standing. Correct posture is as important when doing repetitive low-stress activities (typing) as it is when doing a single heavy-load activity (lifting). RESTING POSITIONS Consider which positions are most painful for you when choosing a  resting position. If you have pain with flexion-based activities (sitting, bending, stooping, squatting), choose a position that allows you to rest in a less flexed posture. You would want to avoid curling into a fetal position on your side. If your pain worsens with extension-based activities (prolonged standing, working overhead), avoid resting in an extended position such as sleeping on your stomach. Most people will find more comfort when they rest with their spine in a more neutral position, neither too rounded nor too arched. Lying on a non-sagging bed on your side with a pillow between your knees, or on your back with a pillow under your knees will often provide some relief. Keep in mind, being in any one position for a prolonged period of time, no matter how correct your posture, can still lead to stiffness. PROPER SITTING POSTURE In order to minimize stress and discomfort on your spine, you must sit with correct posture. Sitting with good posture should be effortless for a healthy body. Returning to good posture is a gradual process. Many people can work toward this most comfortably by using various supports until they have the flexibility and strength to maintain this posture on their own. When sitting with proper posture, your ears will fall over your shoulders and your shoulders will fall over your hips. You should use the back of the chair to support your upper back. Your lower back will be in a neutral position, just slightly arched. You may place a small pillow or folded towel at the base of your lower back for support.  When working at a desk, create an environment that supports good, upright posture. Without extra support, muscles tire, which leads to excessive strain on joints and other tissues. Keep these recommendations in mind: CHAIR:  A chair should be able to slide under your desk when your back makes contact with the back of the chair. This allows you to work closely.  The chair's  height should allow your eyes to be level with the upper part of your monitor and your hands to be slightly lower than your elbows. BODY POSITION  Your feet should make contact with the floor. If this is not possible, use a foot rest.  Keep your ears over your shoulders. This will reduce stress on your neck and  lower back. INCORRECT SITTING POSTURES  If you are feeling tired and unable to assume a healthy sitting posture, do not slouch or slump. This puts excessive strain on your back tissues, causing more damage and pain. Healthier options include:  Using more support, like a lumbar pillow.  Switching tasks to something that requires you to be upright or walking.  Talking a brief walk.  Lying down to rest in a neutral-spine position. PROLONGED STANDING WHILE SLIGHTLY LEANING FORWARD  When completing a task that requires you to lean forward while standing in one place for a long time, place either foot up on a stationary 2-4 inch high object to help maintain the best posture. When both feet are on the ground, the lower back tends to lose its slight inward curve. If this curve flattens (or becomes too large), then the back and your other joints will experience too much stress, tire more quickly, and can cause pain. CORRECT STANDING POSTURES Proper standing posture should be assumed with all daily activities, even if they only take a few moments, like when brushing your teeth. As in sitting, your ears should fall over your shoulders and your shoulders should fall over your hips. You should keep a slight tension in your abdominal muscles to brace your spine. Your tailbone should point down to the ground, not behind your body, resulting in an over-extended swayback posture.  INCORRECT STANDING POSTURES  Common incorrect standing postures include a forward head, locked knees and/or an excessive swayback. WALKING Walk with an upright posture. Your ears, shoulders and hips should all  line-up. PROLONGED ACTIVITY IN A FLEXED POSITION When completing a task that requires you to bend forward at your waist or lean over a low surface, try to find a way to stabilize 3 out of 4 of your limbs. You can place a hand or elbow on your thigh or rest a knee on the surface you are reaching across. This will provide you more stability so that your muscles do not fatigue as quickly. By keeping your knees relaxed, or slightly bent, you will also reduce stress across your lower back. CORRECT LIFTING TECHNIQUES DO :   Assume a wide stance. This will provide you more stability and the opportunity to get as close as possible to the object which you are lifting.  Tense your abdominals to brace your spine. Bend at the knees and hips. Keeping your back locked in a neutral-spine position, lift using your leg muscles. Lift with your legs, keeping your back straight.  Test the weight of unknown objects before attempting to lift them.  Try to keep your elbows locked down at your sides in order get the best strength from your shoulders when carrying an object.  Always ask for help when lifting heavy or awkward objects. INCORRECT LIFTING TECHNIQUES DO NOT:   Lock your knees when lifting, even if it is a small object.  Bend and twist. Pivot at your feet or move your feet when needing to change directions.  Assume that you can safely pick up even a paper clip without proper posture. Document Released: 05/05/2005 Document Revised: 07/28/2011 Document Reviewed: 08/17/2008 [facility] Patient Information 2014 [place], [place].

## 2013-07-27 NOTE — Progress Notes (Signed)
Subjective:    Patient ID: [name], female    DOB: 01/11/1941, 73 y.o.   MRN: [id number]  HPI  [name] is back regarding her chronic pain. She has had further issues with her breathing. She is followed by pulmonology for her interstitial lung disease. She is suffering acutely from bronchitis as well. She is just on prednisone for her lungs and RA.   For pain she's using the tramadol 3x per day. Her morphine is 1 to 2 x per day. Before her bronchitis she had gotten to the point where she was almost off the morphine.   She has been limited to walking at home given the weather and her recent infection. She does do a few stretches at home. [name] wants to get back in the pool eventually.  Her back has been more tender with all of the coughing she's doing and with the lack of exercise. She also feels that her left hip is becoming more painful.      Pain Inventory Average Pain 7 Pain Right Now 7 My pain is intermittent, sharp, burning and stabbing  In the last 24 hours, has pain interfered with the following? General activity 8 Relation with others 8 Enjoyment of life 8 What TIME of day is your pain at its worst? morning, daytime Sleep (in general) Fair  Pain is worse with: walking, bending, standing and some activites Pain improves with: rest and medication Relief from Meds: 7  Mobility walk with assistance use a cane how many minutes can you walk? 10 ability to climb steps?  no use a wheelchair  Function retired I need assistance with the following:  dressing, meal prep, household duties and shopping  Neuro/Psych bladder control problems weakness numbness tingling trouble walking spasms anxiety  Prior Studies Any changes since last visit?  yes CT/MRI  Physicians involved in your care Any changes since last visit?  no   Family History  Problem Relation Age of Onset  . Diabetes Mother   . Hypertension Father   . Diabetes Sister   .  Diabetes Brother   . Hypertension Brother   . Heart attack Mother   . Lung cancer Father    History   Social History  . Marital Status: Married    Spouse Name: N/A    Number of Children: N/A  . Years of Education: N/A   Social History Main Topics  . Smoking status: Never Smoker   . Smokeless tobacco: None  . Alcohol Use: No  . Drug Use: No  . Sexual Activity: Not Currently   Other Topics Concern  . None   Social History Narrative  . None   Past Surgical History  Procedure Laterality Date  . Abdominal hysterectomy    . Ovary surgery    . Shoulder surgery    . Brain surgery      Gamma knife 10/13. Needs repeat spring  '14  . Video bronchoscopy Bilateral 05/31/2013    Procedure: VIDEO BRONCHOSCOPY WITHOUT FLUORO;  Surgeon: Brand Males, MD;  Location: [facility];  Service: Cardiopulmonary;  Laterality: Bilateral;   Past Medical History  Diagnosis Date  . Diabetes mellitus   . Hypertension   . Thyroid disease   . Arthritis     endstage changes bilateral knees/bilateral ankles.   . High cholesterol   . 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 02/17/2012    Continue diplopia, left eye pain and left headaches.    . Depression, reactive   .  RA (rheumatoid arthritis)     has been off methotreaxte since 10/13.  . Contusion of left knee     due to fall 1/14.  [name] URI (upper respiratory infection)    BP 164/85  Pulse 90  Resp 14  Ht _0  (1.626 m)  Wt 284 lb (128.822 kg)  BMI 48.72 kg/m2  SpO2 95%  Opioid Risk Score:   Fall Risk Score: High Fall Risk (>13 points) (pt educated on fall risk, pt declined brochure)    Review of Systems  Constitutional: Positive for unexpected weight change.  Respiratory: Positive for cough, shortness of breath and wheezing.        Respiratory infection  Cardiovascular: Positive for leg swelling.  Gastrointestinal: Positive for constipation.  Genitourinary:       Bladder control problems  Musculoskeletal:  Positive for back pain and gait problem.  Neurological: Positive for weakness and numbness.       Tingling, spasms  Psychiatric/Behavioral: The patient is nervous/anxious.   All other systems reviewed and are negative.       Objective:   Physical Exam  General: Alert and oriented x 3, No apparent distress  HEENT: Head is normocephalic, atraumatic, PERRLA, EOMI, sclera anicteric, oral mucosa pink and moist, dentition intact, ext ear canals clear,  Neck: Supple without JVD or lymphadenopathy  Heart: Reg rate and rhythm. No murmurs rubs or gallops  Chest: CTA bilaterally without wheezes, rales, or rhonchi; no distress  Abdomen: Soft, non-tender, non-distended, bowel sounds positive.  Extremities: No clubbing, cyanosis, 2+ edema. Pulses are 2+  Skin: Clean and intact without signs of breakdown  Neuro: Pt is cognitively appropriate with normal insight, memory, and awareness. Cranial nerves 2-12 are intact. Sensory exam is near normal in the legs... Reflexes are 2+ in all 4's. Fine motor coordination is intact. No tremors. Motor function is grossly 4 to 5/5 with pain inhbition. SLR was equivocal.  Musculoskeletal: able to bend at waist to 50 degrees with a lot of effort. Left lumbar paraspinals are tender and feel tight. Extension and right bending remain painful. . She is slightly elevated in the left pelvis. She is antalgic with gait on the left. Mild joint effusion noted right knee. Crepitus in both knees. No gross knee instability. She needed to extra time to stand from sitting.  Psych: Pt's affect is appropriate. Pt is cooperative    Assessment & Plan:   Assessment:  1. Lumbar spinal stenosis with neurogenic claudication.  2. Depression.  3. Diabetes mellitus type 2.  4. Rheumatoid arthritis. Multiple joint involvement including knees.   5. Left greater troch bursitis  6. Interstitial lung disease      Plan:  1. Pulmonary rehab pending 2. Pulmonary mgt of acute bronchitis in the  setting of her ILD.   3. MSO4 for breakthrough pain-- 1-2 per day, tramadol for less severe pain up to 3x per day.  4. Needs to work on pelvic and LB ROM at home.   5. Continue cymbalta to 41m bid for neuropathic and axial spine pain.  6. Follow up with me in about 2 months. 30 minutes of face to face patient care time were spent during this visit. All questions were encouraged and answered. Consider xray of left hip at next follow up if no improvement

## 2013-07-30 ENCOUNTER — Other Ambulatory Visit: Payer: Self-pay | Admitting: Internal Medicine

## 2013-08-01 ENCOUNTER — Telehealth: Payer: Self-pay

## 2013-08-01 MED ORDER — GABAPENTIN 600 MG PO TABS: 600.0000 mg | ORAL_TABLET | ORAL | Status: DC

## 2013-08-01 NOTE — Telephone Encounter (Signed)
Gabapentin refilled.

## 2013-08-01 NOTE — Telephone Encounter (Signed)
Yes, that's fine

## 2013-08-01 NOTE — Telephone Encounter (Signed)
Refill request from [facility] for gabapentin 671m 2 tablets in the am, 2 in the middle of the day and 3 at bedtime.  Please advise.

## 2013-08-02 ENCOUNTER — Other Ambulatory Visit: Payer: Self-pay | Admitting: Internal Medicine

## 2013-08-02 DIAGNOSIS — J849 Interstitial pulmonary disease, unspecified: Secondary | ICD-10-CM

## 2013-08-03 ENCOUNTER — Telehealth: Payer: Self-pay | Admitting: Internal Medicine

## 2013-08-03 ENCOUNTER — Inpatient Hospital Stay (HOSPITAL_COMMUNITY): Admission: RE | Admit: 2013-08-03 | Payer: Medicare Other | Source: Ambulatory Visit

## 2013-08-03 DIAGNOSIS — J849 Interstitial pulmonary disease, unspecified: Secondary | ICD-10-CM

## 2013-08-03 NOTE — Telephone Encounter (Signed)
Spoke with [name]. No order was placed for pt to go to pulm rehab as advised by MR in Jan. I have put order in EPIC. Nothing further needed

## 2013-08-10 ENCOUNTER — Encounter (HOSPITAL_COMMUNITY)
Admission: RE | Admit: 2013-08-10 | Discharge: 2013-08-10 | Disposition: A | Discharge status: Home / Self Care | Payer: Medicare Other | Source: Ambulatory Visit | Attending: Internal Medicine | Admitting: Internal Medicine

## 2013-08-10 ENCOUNTER — Encounter (HOSPITAL_COMMUNITY): Payer: Self-pay

## 2013-08-10 ENCOUNTER — Encounter (HOSPITAL_COMMUNITY): Payer: Medicare Other

## 2013-08-10 DIAGNOSIS — M545 Low back pain, unspecified: Secondary | ICD-10-CM | POA: Diagnosis not present

## 2013-08-10 DIAGNOSIS — M76899 Other specified enthesopathies of unspecified lower limb, excluding foot: Secondary | ICD-10-CM | POA: Insufficient documentation

## 2013-08-10 DIAGNOSIS — I10 Essential (primary) hypertension: Secondary | ICD-10-CM | POA: Insufficient documentation

## 2013-08-10 DIAGNOSIS — J8410 Pulmonary fibrosis, unspecified: Secondary | ICD-10-CM | POA: Diagnosis not present

## 2013-08-10 DIAGNOSIS — Z5189 Encounter for other specified aftercare: Secondary | ICD-10-CM | POA: Diagnosis not present

## 2013-08-10 DIAGNOSIS — R262 Difficulty in walking, not elsewhere classified: Secondary | ICD-10-CM | POA: Diagnosis not present

## 2013-08-10 DIAGNOSIS — E669 Obesity, unspecified: Secondary | ICD-10-CM | POA: Insufficient documentation

## 2013-08-10 DIAGNOSIS — R079 Chest pain, unspecified: Secondary | ICD-10-CM | POA: Insufficient documentation

## 2013-08-10 HISTORY — DX: Unspecified asthma, uncomplicated: J45.909

## 2013-08-10 HISTORY — DX: Coagulation defect, unspecified: D68.9

## 2013-08-10 HISTORY — DX: Occlusion and stenosis of unspecified carotid artery: I65.29

## 2013-08-10 NOTE — Progress Notes (Signed)
[name] arrived today in Pulmonary Rehab via wheelchair for orientation.  She is accompanied by her husband.  She is alert, and oriented, a well dressed obese lady appearing her stated age.  Very pleasant.   Program expectations and guidelines reviewed with patient and her husband.  Medical history and medications reviewed with patient.  She states she is chronically fatigued, falls asleep easily.  We discussed possibilities of sleep apnea and she will discuss this with her physicians.  Heart rate is regular, chest is clear.  Grips are equal and strong.  She does have lower extremity non pitting edema.  Faint distal pulses.  Demonstration and practice of PLB using pulse oximeter.  Patient able to return demonstration satisfactorily. Safety and hand hygiene in the exercise area reviewed with patient.  Patient voices understanding. She is instructed to bring glucometer for pre and post blood sugar check when she starts exercise. She is scheduled for walk test on 09/11/2013 and begin exercise next week.  We look forward to working with this nice lady to improve her quality of life.   1:00 pm to 2:40 pm Leverne Humbles RN

## 2013-08-11 ENCOUNTER — Encounter (HOSPITAL_COMMUNITY): Payer: Self-pay

## 2013-08-11 ENCOUNTER — Encounter (HOSPITAL_COMMUNITY)
Admission: RE | Admit: 2013-08-11 | Discharge: 2013-08-11 | Disposition: A | Discharge status: Home / Self Care | Payer: Medicare Other | Source: Ambulatory Visit | Attending: Internal Medicine | Admitting: Internal Medicine

## 2013-08-11 DIAGNOSIS — M76899 Other specified enthesopathies of unspecified lower limb, excluding foot: Secondary | ICD-10-CM | POA: Diagnosis not present

## 2013-08-11 DIAGNOSIS — R079 Chest pain, unspecified: Secondary | ICD-10-CM | POA: Diagnosis not present

## 2013-08-11 DIAGNOSIS — E669 Obesity, unspecified: Secondary | ICD-10-CM | POA: Diagnosis not present

## 2013-08-11 DIAGNOSIS — I10 Essential (primary) hypertension: Secondary | ICD-10-CM | POA: Diagnosis not present

## 2013-08-11 DIAGNOSIS — J8410 Pulmonary fibrosis, unspecified: Secondary | ICD-10-CM | POA: Diagnosis not present

## 2013-08-11 DIAGNOSIS — Z5189 Encounter for other specified aftercare: Secondary | ICD-10-CM | POA: Diagnosis not present

## 2013-08-11 NOTE — Progress Notes (Signed)
[name] completed a Six-Minute Walk Test on 08/11/13 . [name] walked 436 feet with 2 breaks equaling out to be 2 minutes and 15 seconds.  The patient's lowest oxygen saturation was 85 , highest heart rate was 115 , and highest blood pressure was 142/64. The patient was on room air to begin with and then placed on 2 liters. [name] stated that shortness of breath hindered their walk test.

## 2013-08-12 ENCOUNTER — Other Ambulatory Visit (HOSPITAL_COMMUNITY): Payer: Self-pay | Admitting: *Deleted

## 2013-08-15 ENCOUNTER — Encounter (HOSPITAL_COMMUNITY)
Admission: RE | Admit: 2013-08-15 | Discharge: 2013-08-15 | Disposition: A | Discharge status: Home / Self Care | Payer: Medicare Other | Source: Ambulatory Visit | Attending: Rheumatology | Admitting: Rheumatology

## 2013-08-15 DIAGNOSIS — E669 Obesity, unspecified: Secondary | ICD-10-CM | POA: Diagnosis not present

## 2013-08-15 DIAGNOSIS — I10 Essential (primary) hypertension: Secondary | ICD-10-CM | POA: Diagnosis not present

## 2013-08-15 DIAGNOSIS — Z5189 Encounter for other specified aftercare: Secondary | ICD-10-CM | POA: Diagnosis not present

## 2013-08-15 DIAGNOSIS — R079 Chest pain, unspecified: Secondary | ICD-10-CM | POA: Diagnosis not present

## 2013-08-15 DIAGNOSIS — M76899 Other specified enthesopathies of unspecified lower limb, excluding foot: Secondary | ICD-10-CM | POA: Diagnosis not present

## 2013-08-15 DIAGNOSIS — J8410 Pulmonary fibrosis, unspecified: Secondary | ICD-10-CM | POA: Diagnosis not present

## 2013-08-15 MED ORDER — TOCILIZUMAB 400 MG/20ML IV SOLN
480.0000 mg | INTRAVENOUS | Status: DC
  Administered 2013-08-15: 480 mg via INTRAVENOUS
  Filled 2013-08-15: qty 24

## 2013-08-15 MED ORDER — SODIUM CHLORIDE 0.9 % IV SOLN
INTRAVENOUS | Status: DC
  Administered 2013-08-15: 250 mL via INTRAVENOUS

## 2013-08-15 MED ORDER — ACETAMINOPHEN 325 MG PO TABS: 650.0000 mg | ORAL_TABLET | ORAL | Status: DC

## 2013-08-15 MED ORDER — DIPHENHYDRAMINE HCL 25 MG PO TABS
25.0000 mg | ORAL_TABLET | ORAL | Status: DC
  Filled 2013-08-15: qty 1

## 2013-08-16 ENCOUNTER — Encounter (HOSPITAL_COMMUNITY): Payer: Self-pay

## 2013-08-16 ENCOUNTER — Encounter (HOSPITAL_COMMUNITY)
Admission: RE | Admit: 2013-08-16 | Discharge: 2013-08-16 | Disposition: A | Discharge status: Home / Self Care | Payer: Medicare Other | Source: Ambulatory Visit | Attending: Internal Medicine | Admitting: Internal Medicine

## 2013-08-16 DIAGNOSIS — R079 Chest pain, unspecified: Secondary | ICD-10-CM | POA: Diagnosis not present

## 2013-08-16 DIAGNOSIS — Z5189 Encounter for other specified aftercare: Secondary | ICD-10-CM | POA: Diagnosis not present

## 2013-08-16 DIAGNOSIS — M76899 Other specified enthesopathies of unspecified lower limb, excluding foot: Secondary | ICD-10-CM | POA: Diagnosis not present

## 2013-08-16 DIAGNOSIS — E669 Obesity, unspecified: Secondary | ICD-10-CM | POA: Diagnosis not present

## 2013-08-16 DIAGNOSIS — J8410 Pulmonary fibrosis, unspecified: Secondary | ICD-10-CM | POA: Diagnosis not present

## 2013-08-16 DIAGNOSIS — I10 Essential (primary) hypertension: Secondary | ICD-10-CM | POA: Diagnosis not present

## 2013-08-16 NOTE — Progress Notes (Signed)
Today, [name] exercised at [facility]. [facility]. Service time was from 10:30 to 12:30.  The patient exercised for more than 31 minutes performing aerobic, strengthening, and stretching exercises. Oxygen saturation, heart rate, blood pressure, rate of perceived exertion, and shortness of breath were all monitored before, during, and after exercise. [name] presented with no problems at today's exercise session.   Pre-exercise vitals:   Weight kg: 131.9   Liters of O2: 0   SpO2: 95   HR: 85   BP: 100/60   CBG: 115  Exercise vitals:   Highest heartrate:  92   Lowest oxygen saturation: 95   Highest blood pressure: 120/60   Liters of 02: 0  Post-exercise vitals:   SpO2: 95   HR: 89   BP: 102/64   Liters of O2: 0   CBG: 83  Dr. Brand Males, Medical Director Dr. Ree Kida is immediately available during today's Pulmonary Rehab session for [name] on 08/16/13 at 10:30am class time.

## 2013-08-18 ENCOUNTER — Encounter (HOSPITAL_COMMUNITY)
Admission: RE | Admit: 2013-08-18 | Discharge: 2013-08-18 | Disposition: A | Discharge status: Home / Self Care | Payer: Medicare Other | Source: Ambulatory Visit | Attending: Internal Medicine | Admitting: Internal Medicine

## 2013-08-18 DIAGNOSIS — I10 Essential (primary) hypertension: Secondary | ICD-10-CM | POA: Insufficient documentation

## 2013-08-18 DIAGNOSIS — J84115 Respiratory bronchiolitis interstitial lung disease: Secondary | ICD-10-CM | POA: Diagnosis not present

## 2013-08-18 DIAGNOSIS — M545 Low back pain, unspecified: Secondary | ICD-10-CM | POA: Insufficient documentation

## 2013-08-18 DIAGNOSIS — Z79899 Other long term (current) drug therapy: Secondary | ICD-10-CM | POA: Diagnosis not present

## 2013-08-18 DIAGNOSIS — J8410 Pulmonary fibrosis, unspecified: Secondary | ICD-10-CM | POA: Insufficient documentation

## 2013-08-18 DIAGNOSIS — Z09 Encounter for follow-up examination after completed treatment for conditions other than malignant neoplasm: Secondary | ICD-10-CM | POA: Diagnosis not present

## 2013-08-18 DIAGNOSIS — R079 Chest pain, unspecified: Secondary | ICD-10-CM | POA: Insufficient documentation

## 2013-08-18 DIAGNOSIS — R262 Difficulty in walking, not elsewhere classified: Secondary | ICD-10-CM | POA: Insufficient documentation

## 2013-08-18 DIAGNOSIS — M069 Rheumatoid arthritis, unspecified: Secondary | ICD-10-CM | POA: Diagnosis not present

## 2013-08-18 DIAGNOSIS — E669 Obesity, unspecified: Secondary | ICD-10-CM | POA: Insufficient documentation

## 2013-08-18 DIAGNOSIS — M76899 Other specified enthesopathies of unspecified lower limb, excluding foot: Secondary | ICD-10-CM | POA: Insufficient documentation

## 2013-08-18 DIAGNOSIS — Z5189 Encounter for other specified aftercare: Secondary | ICD-10-CM | POA: Insufficient documentation

## 2013-08-18 NOTE — Progress Notes (Signed)
Nutrition Note Spoke with pt per Rosebud Poles, RN request. Pt had several dietary questions re: DM diet and exercise. Pt ate 2 eggs, 2 sausages, 2 slices of bacon, 1/2 grapefruit, 1/2 of a [place] roll with butter and jam, and coffee around 6 am, which pt reports "is a big breakfast for me." Pt ate cheddar cheese and crackers around 8 am prior to exercise. Pre-exercise CBG 107 mg/dL. Pt states she typically eats eggs, fruit, and coffee. Alternative pre-exercise breakfast discussed including the need to avoid grapefruit while taking Lipitor. Pt states she was aware of the need to limit grapefruit while on a statin and "I don't have grapefruit very often." Pt c/o 30 lb wt gain over the past 3 months due to prednisone. Pt has been avoiding many carbs due to prednisone and blood sugars. Moderate consumption of whole grain carbs, fruits, starchy vegetables, and dairy discussed. Pt taking Tradjenta. How Tradjenta works to help control CBG's discussed. Pt reports fear of kidney failure/dialysis due to "both my brothers have been on dialysis." Pt's fear of dialysis, including limiting some high potassium foods, discussed. Per discussion, pt typically does not eat lunch. Rational for eating frequently reviewed. Pt feels she sometimes does not "know what to eat." Pt given a handout for 1800 kcal, 5-day menu ideas. Continue client-centered nutrition education by RD as part of interdisciplinary care.  Monitor and evaluate progress toward nutrition goal with team.  Derek Mound, M.Ed, RD, LDN, CDE 08/18/2013 12:42 PM

## 2013-08-23 ENCOUNTER — Encounter (HOSPITAL_COMMUNITY)
Admission: RE | Admit: 2013-08-23 | Discharge: 2013-08-23 | Disposition: A | Discharge status: Home / Self Care | Payer: Medicare Other | Source: Ambulatory Visit | Attending: Internal Medicine | Admitting: Internal Medicine

## 2013-08-23 ENCOUNTER — Encounter (HOSPITAL_COMMUNITY): Payer: Self-pay

## 2013-08-23 DIAGNOSIS — I10 Essential (primary) hypertension: Secondary | ICD-10-CM | POA: Diagnosis not present

## 2013-08-23 DIAGNOSIS — M76899 Other specified enthesopathies of unspecified lower limb, excluding foot: Secondary | ICD-10-CM | POA: Diagnosis not present

## 2013-08-23 DIAGNOSIS — M545 Low back pain, unspecified: Secondary | ICD-10-CM | POA: Diagnosis not present

## 2013-08-23 DIAGNOSIS — R262 Difficulty in walking, not elsewhere classified: Secondary | ICD-10-CM | POA: Diagnosis not present

## 2013-08-23 DIAGNOSIS — J8410 Pulmonary fibrosis, unspecified: Secondary | ICD-10-CM | POA: Diagnosis not present

## 2013-08-23 DIAGNOSIS — E669 Obesity, unspecified: Secondary | ICD-10-CM | POA: Diagnosis not present

## 2013-08-23 DIAGNOSIS — R079 Chest pain, unspecified: Secondary | ICD-10-CM | POA: Diagnosis not present

## 2013-08-23 DIAGNOSIS — Z5189 Encounter for other specified aftercare: Secondary | ICD-10-CM | POA: Diagnosis not present

## 2013-08-23 NOTE — Progress Notes (Signed)
Today, Lounette exercised at [facility]. [facility]. Service time was from 10:30 to 12:30.  The patient exercised for more than 31 minutes performing aerobic, strengthening, and stretching exercises. Oxygen saturation, heart rate, blood pressure, rate of perceived exertion, and shortness of breath were all monitored before, during, and after exercise. Zephyra presented with no problems at today's exercise session.   There was no workload change during today's exercise session.  Pre-exercise vitals:   Weight kg: 134.7   Liters of O2: 0   SpO2: 94   HR: 92   BP: 128/62   CBG: 145  Exercise vitals:   Highest heartrate:  107   Lowest oxygen saturation: 94   Highest blood pressure: 132/80   Liters of 02: 2  Post-exercise vitals:   SpO2: 96   HR: 93   BP: 104/60   Liters of O2: 2   CBG: 107  Dr. Brand Males, Medical Director Dr. Aileen Fass is immediately available during today's Pulmonary Rehab session for Taquanna E Bohle on 08/23/13 at 10:30 class time.

## 2013-08-25 ENCOUNTER — Encounter (HOSPITAL_COMMUNITY): Payer: Self-pay

## 2013-08-25 ENCOUNTER — Encounter (HOSPITAL_COMMUNITY)
Admission: RE | Admit: 2013-08-25 | Discharge: 2013-08-25 | Disposition: A | Discharge status: Home / Self Care | Payer: Medicare Other | Source: Ambulatory Visit | Attending: Internal Medicine | Admitting: Internal Medicine

## 2013-08-25 NOTE — Progress Notes (Signed)
Today, [name] exercised at [facility]. [facility]. Service time was from 10:30 to 12:30.  The patient exercised for more than 31 minutes performing aerobic, strengthening, and stretching exercises. Oxygen saturation, heart rate, blood pressure, rate of perceived exertion, and shortness of breath were all monitored before, during, and after exercise. [name] presented with no problems at today's exercise session. The patient attended the education class "Advanced Directives" with Jeanella Craze today.  There was no workload change during today's exercise session.  Pre-exercise vitals:   Weight kg: 134.7   Liters of O2: 2   SpO2: 98   HR: 89   BP: 120/60   CBG: 137  Exercise vitals:   Highest heartrate:  86   Lowest oxygen saturation: 96   Highest blood pressure: 122/70   Liters of 02: 2  Post-exercise vitals:   SpO2: 98   HR: 93   BP: 108/62   Liters of O2: 2   CBG: 87  Dr. Brand Males, Medical Director Dr. Dyann Kief is immediately available during today's Pulmonary Rehab session for [name] on 08/25/13 at 1030 class time.

## 2013-08-30 ENCOUNTER — Encounter (HOSPITAL_COMMUNITY)
Admission: RE | Admit: 2013-08-30 | Discharge: 2013-08-30 | Disposition: A | Discharge status: Home / Self Care | Payer: Medicare Other | Source: Ambulatory Visit | Attending: Internal Medicine | Admitting: Internal Medicine

## 2013-08-30 ENCOUNTER — Encounter (HOSPITAL_COMMUNITY): Payer: Self-pay | Admitting: *Deleted

## 2013-08-30 NOTE — Progress Notes (Signed)
Today, [name] exercised at [facility]. [facility]. Service time was from 1030 to 1230.  The patient exercised for more than 31 minutes performing aerobic, strengthening, and stretching exercises. Oxygen saturation, heart rate, blood pressure, rate of perceived exertion, and shortness of breath were all monitored before, during, and after exercise. [name] presented with no problems at today's exercise session.   There was no workload change during today's exercise session.  Pre-exercise vitals:   Weight kg: 133.7   Liters of O2: 2   SpO2: 98   HR: 92   BP: 142/70   CBG: 131  Exercise vitals:   Highest heartrate:  98   Lowest oxygen saturation: 95   Highest blood pressure: 128/70   Liters of 02: 2  Post-exercise vitals:   SpO2: 96   HR: 95   BP: 122/80   Liters of O2: 2   CBG: 112 Dr. Brand Males, Medical Director Dr. Dyann Kief is immediately available during today's Pulmonary Rehab session for [name] on 08/30/2013 at 10:30 class time.

## 2013-08-31 DIAGNOSIS — M1710 Unilateral primary osteoarthritis, unspecified knee: Secondary | ICD-10-CM | POA: Diagnosis not present

## 2013-09-01 ENCOUNTER — Encounter (HOSPITAL_COMMUNITY): Payer: Medicare Other

## 2013-09-06 ENCOUNTER — Encounter (HOSPITAL_COMMUNITY)
Admission: RE | Admit: 2013-09-06 | Discharge: 2013-09-06 | Disposition: A | Discharge status: Home / Self Care | Payer: Medicare Other | Source: Ambulatory Visit | Attending: Internal Medicine | Admitting: Internal Medicine

## 2013-09-06 NOTE — Progress Notes (Signed)
Today, [name] exercised at [facility]. [facility]. Service time was from 10:30 to 12:30.  The patient exercised for more than 31 minutes performing aerobic, strengthening, and stretching exercises. Oxygen saturation, heart rate, blood pressure, rate of perceived exertion, and shortness of breath were all monitored before, during, and after exercise. [name] presented with no problems at today's exercise session.   There was NO workload change during today's exercise session.  Pre-exercise vitals:   Weight kg: 133.1   Liters of O2: RA   SpO2: 99   HR: 92   BP: 118/64   CBG: 124  Exercise vitals:   Highest heartrate:  107   Lowest oxygen saturation: 97   Highest blood pressure: 140/82   Liters of 02: 2  Post-exercise vitals:   SpO2: 98   HR: 86   BP: 104/62   Liters of O2: 2   CBG: 112  Dr. Brand Males, Medical Director Dr. Aileen Fass is immediately available during today's Pulmonary Rehab session for [name] on 09/06/13 at 10:30 class time.

## 2013-09-07 DIAGNOSIS — M1710 Unilateral primary osteoarthritis, unspecified knee: Secondary | ICD-10-CM | POA: Diagnosis not present

## 2013-09-08 ENCOUNTER — Encounter (HOSPITAL_COMMUNITY)
Admission: RE | Admit: 2013-09-08 | Discharge: 2013-09-08 | Disposition: A | Discharge status: Home / Self Care | Payer: Medicare Other | Source: Ambulatory Visit | Attending: Internal Medicine | Admitting: Internal Medicine

## 2013-09-08 NOTE — Progress Notes (Signed)
Today, [name] exercised at [facility]. [facility]. Service time was from 1100 to 1230.  The patient exercised for more than 31 minutes performing aerobic, strengthening, and stretching exercises. Oxygen saturation, heart rate, blood pressure, rate of perceived exertion, and shortness of breath were all monitored before, during, and after exercise. [name] presented with a CBG of 95, at recheck 15 minutes later after eating a protein bar and lemonade it was 86 at today's exercise session. Consulted with diabetes expert/nutritionist and patient had held [place] last night and this morning and states her blood sugar is never above 100.  She was allowed to exercise, no complaints by patient.  Nutritional consult done, patient states she understands information given to her re: eating choices for her diabetes.    There was no workload change during today's exercise session.  Pre-exercise vitals:   Weight kg: 134.2   Liters of O2: 2   SpO2: 100   HR: 92   BP: 124/72   CBG: 95 and recheck 86  Exercise vitals:   Highest heartrate:  92   Lowest oxygen saturation: 99   Highest blood pressure: 124/72   Liters of 02: 2  Post-exercise vitals:   SpO2: 99   HR: 86   BP: 120/64   Liters of O2: 2   CBG: 109 Dr. Brand Males, Medical Director Dr. Sheran Fava is immediately available during today's Pulmonary Rehab session for [name] on 09/08/2013 at 1030 class time.

## 2013-09-08 NOTE — Progress Notes (Signed)
[name] 73 y.o. female Nutrition Note Spoke with pt. Pt is obese. Pt wants to lose weight. She is trying to lose weight by watching her portion sizes and trying to exercise/move around more. Pt eats 3 meals a day. Pt usually eats eggs, yogurt, toast, and a small piece of sausage for breakfast, tuna salad or canned soup with crackers for lunch, grilled/broiled/baked meat and vegetables for dinner, and fresh fruits as snacks throughout the day. Pt reports most meals are prepared at home, but reports she eats out for dinner ~3 times a week as she gets tired and has no stamina while cooking dinner. Pt reports she wants to get back her stamina to cook food. Pt was educated on ways she can cook/prepare food without getting tired easily.Pt is making healthy food choices the majority of the time. Pt reports when she does go out for dinner she usually picks healthy options, such as grilled/broiled fish and vegetables. Pt's Rate Your Plate results reviewed with pt. Pt was educated on monitoring her sodium intake, such as watching out for salt content in canned soups and when eating out. Pt expressed understanding. Pt is diabetic. Pt checks CBG's at least 1 time a day (before breakfast). CBG's reportedly 90-93 mg/dL before meals. Prior to class, pt's blood sugar was 95 mg/dL. Pt reported that she consumed a cup of yogurt, honey bun, and orange juice before coming in to help raise her blood sugar. Pt also drank a cup of lemonade and ate a granola protein bar while waiting to start exercising, blood sugar was check again and it was 85 mg/dL. She reports it is very difficult to raise her blood sugar and can not eat/drink anything else as she is "stuffed". Pt reports her blood glucose very rarely gets above 100 mg/dL. Noted pt is on Tradjenta, which can cause blood glucose to not rise quickly. Pt reports she did not take her Tradjenta last night and this morning because her blood sugar was around 90 mg/dL. Pt was given  verbal permission that is was fine for her to exercise given her blood sugar and to watch closely for signs of hypoglycemia. Pt expressed understanding.   Nutrition Diagnosis   Food-and nutrition-related knowledge deficit related to lack of exposure to information as related to diagnosis of pulmonary disease   Obesity related to excessive energy intake as evidenced by a BMI of 48.8     Nutrition Rx/Est. Daily Nutrition Needs for: ? wt loss 1700-2200 Kcal, 100-110 gm protein, 1500-2000 mg or less sodium, 250-325 gm CHO  Nutrition Intervention   Pt's individual nutrition plan and goals reviewed with pt.   Benefits of adopting healthy eating habits discussed when pt's Rate Your Plate reviewed.   Pt to attend the Nutrition and Lung Disease class   Continual client-centered nutrition education by RD, as part of interdisciplinary care. Goal(s) 1. Pt to identify and limit food sources of sodium. 2. Identify food quantities necessary to achieve wt loss of  -2# per week to a goal wt of 120-129 kg (265-283 lb) at graduation from pulmonary rehab. 3. Describe the benefit of including fruits, vegetables, whole grains, and low-fat dairy products in a healthy meal plan.  Monitor and Evaluate progress toward nutrition goal with team.   [place] Intern   09/08/2013 12:24 PM  Derek Mound, M.Ed, RD, LDN, CDE 09/08/2013 2:23 PM

## 2013-09-12 ENCOUNTER — Ambulatory Visit (INDEPENDENT_AMBULATORY_CARE_PROVIDER_SITE_OTHER): Payer: Medicare Other | Admitting: Internal Medicine

## 2013-09-12 ENCOUNTER — Encounter: Payer: Self-pay | Admitting: Internal Medicine

## 2013-09-12 VITALS — BP 160/96 | HR 101 | Ht 64.0 in | Wt 291.0 lb

## 2013-09-12 DIAGNOSIS — J849 Interstitial pulmonary disease, unspecified: Secondary | ICD-10-CM

## 2013-09-12 DIAGNOSIS — J8410 Pulmonary fibrosis, unspecified: Secondary | ICD-10-CM | POA: Diagnosis not present

## 2013-09-12 LAB — PULMONARY FUNCTION TEST
DL/VA % PRED: 91 %
DL/VA: 4.38 ml/min/mmHg/L
DLCO UNC: 12.5 ml/min/mmHg
DLCO unc % pred: 51 %
FEF 25-75 POST: 1.15 L/s
FEF 25-75 Pre: 3.38 L/sec
FEF2575-%CHANGE-POST: -66 %
FEF2575-%PRED-PRE: 208 %
FEF2575-%Pred-Post: 70 %
FEV1-%Change-Post: -16 %
FEV1-%Pred-Post: 76 %
FEV1-%Pred-Pre: 90 %
FEV1-PRE: 1.63 L
FEV1-Post: 1.36 L
FEV1FVC-%CHANGE-POST: -2 %
FEV1FVC-%Pred-Pre: 118 %
FEV6-%CHANGE-POST: -14 %
FEV6-%Pred-Post: 68 %
FEV6-%Pred-Pre: 80 %
FEV6-Post: 1.52 L
FEV6-Pre: 1.78 L
FEV6FVC-%Change-Post: -1 %
FEV6FVC-%PRED-POST: 102 %
FEV6FVC-%Pred-Pre: 104 %
FVC-%CHANGE-POST: -13 %
FVC-%PRED-POST: 66 %
FVC-%Pred-Pre: 77 %
FVC-Post: 1.54 L
FVC-Pre: 1.78 L
POST FEV1/FVC RATIO: 89 %
PRE FEV1/FVC RATIO: 91 %
Post FEV6/FVC ratio: 99 %
Pre FEV6/FVC Ratio: 100 %
RV % pred: 66 %
RV: 1.48 L
TLC % pred: 70 %
TLC: 3.57 L

## 2013-09-12 NOTE — Progress Notes (Signed)
Subjective:    Patient ID: [name], female    DOB: [birth year], 73 y.o.   MRN: [id number]  HPI    PCP Maximino Greenland, MD [place] - Dr Estanislado Pandy Cards - Dr Woody Seller at [facility]  HPI  IOV 04/06/2013  Chief Complaint  Patient presents with  . Pulmonary Consult    for SOb x 2 months.    73 year-old morbidly obese [name] female, nonsmoker. History of rheumatoid arthritis for several years. Referred for dyspnea  Background rheumatoid arthritis history  - Noted arthritis present for several years. Mostly affecting the joints. Follows with rheumatologist. SHe used to be on remicade with MTx 5-6 years ago. Then on Orencia with methotrexate. Last few years MTXaline and finally due to worsening jt disesae MTx stopped and she is on actmera with prednisone, colchicine, Plaquenil along with other pain medications of Neurontin, Cymbalta, morphine and Tylenol    Current history  - She is now reporting 2 months of insidious onset of shortness of breath. Several months ago she was seen by cardiologist for edema and was apparently told her heart was fine. Current dyspnea is not progressive but is of moderate to severe in intensity. Worsened by exertion relieved by rest but all the present even at rest. Changing clothes makes her dyspneic. Improved by staying still. No suicide orthopnea, chest pain. Currently the edema status is improved she says.   Dyspnea relevant Hx  - Walk test 185 feet x3 laps on room air today 04/06/2013. She only completed 2 laps and got very dyspneic and fatigued. The heart rate was 111. She desaturated to 88%  - Recollects history bronchoscopy several years ago by Dr. Asencion Noble I do not have these results with him he   -  reports that she has never smoked. She does not have any smokeless tobacco history on file.   - Body mass index is 47.39 kg/(m^2).  - Lab is rheumatologist 03/14/2013 shows a CBC white cell count of 3.7-10% used to  flows. Creatinine of 0.99 mg percent  CT 03/13/04  IMPRESSION  1. No evidence of pulmonary embolism or other acute disease.  2. Chronic bibasilar interstitial changes and shotty hilar and mediastinal lymph nodes, which are  nonspecific.  Provider: [name]  OV 04/28/2013   Chief Complaint  Patient presents with  . Follow-up    Pt c/o SOB with exertion, hoarseness, wheezing.  Pt denies any congestion.     CT shows ILD. WTIh her hx of RA and  being on iimmuniosuppressive drugs: ddx is this is RA or opportunistic infection. SHe will need bronch with BAL.[name] So need quicker followup than 05/11/13 preferred with me in dec 2015 by mid-dec 2014. IF that is not poissioble, have her see TP the NP, so I Can set bronch through her. She needs to finish her other tests as well   ono 04/11/13 shows lowerst pulse ox 79% and total time </= 89% at 6 min and </= 88% at 3 min. She has very mild desat. Does not qualify for overnight oxygen  Pulmonary function test 04/28/2013 shows FEV1 1.7 L/72%. FEV1 1.46 L/81%. Ratio to 84/109%. Suggestive of restriction. Total lung capacity is 2.9 L/58% and consistent with restriction.  To discuss results: does not want portable o2. Stable dyspnea. No change in health. She is agreeable to bronch for BAL - ddx opportunistic infetin v RA-ILD. Too high risk for transbronchial bx + low yield   05/04/2013  Acute  ov/[name] re:  ILD/ acute sob x 2 days and RA worse  Chief Complaint  Patient presents with  . Acute Visit    Pt c/o increased SOB and non prod cough x 2 days. She is using rescue inhaler at least 2-3 times per day.     Inhaler really helps Reduced pred to 5 mg daily x sev weeks prior to flare and arthritis worse when breathing worse  No real change in cough freq/ intensity since sob worse   No obvious day to day or daytime variabilty or assoc chronic cough or cp or chest tightness, subjective wheeze overt sinus or hb symptoms. No unusual exp hx or h/o  childhood pna/ asthma or knowledge of premature birth.  Sleeping ok without nocturnal  or early am exacerbation  of respiratory  c/o's or need for noct saba. Also denies any obvious fluctuation of symptoms with weather or environmental changes or other aggravating or alleviating factors except as outlined above   Telep[ehone call 06/10/13  BAL shows lot of neutrophils and ddx is acut viral/bacterial infection ([place] had just completed abx Rx) or aspiration (please check on this). Main thing is no opportunisitc infection given her extensive immunosuppressive regimen. AT this time I wil not recommend change to her immunosuppressive regiment but would recommend rehab for ILD, and weight loss. Fu with me with PFT in 6 months  06/10/2013 Follow up  Returns for follow up  Had recent bronchitic flare 2 weeks ago , tx w/ Levaquin . Feeling better w/ resolved cough  Had FOB on 05/31/13  that showed inflammatory cells, no malignant cells. Cx non path flora .  On prednisone 62m daily .  She is feeling better , nearing her baseline.  No chest pain, orthopnea, hemoptysis, or increased edema.   REC May decrease Prednisone 559mdaily  We are referring you to pulmonary rehab.  May restart RA infusions as directed by Rheumatology.  Follow up Dr. RaChase Callern 6 months with PFT  Please contact office for sooner follow up if symptoms do not improve or worsen or seek emergency care.   OV 07/18/2013  Chief Complaint  Patient presents with  . Acute Visit    Pt c/o having SOB, chest tightness, wheezing, productive cough wiht yellow phlegm x 2 weeks. Pt states she was given a rochephin injection and levaquin rx by PCP.    Now on prednisone 1057mlaquenil and Actermia infusion for RA  Past 1 week having head cold, chest tighness, cough, yellow sputum, some malaise. Given rocephin at MD PCP [facility] Green[facility] office and now on levaquin but no better. SO, came here. DEnies fever, n, v, d. HAs chronic ches tighness  with exertion that bother her  But stress test 2013 was negative  #Acute bronchitis Stop levaquin  Take doxycycline 100m4m twice daily x 5 days; take after meals and avoid sunlight Take prednisone 40 mg daily x 2 days, then 30mg84mly x 2 days, then 20mg 27my x 2 days, and then to continue at 10mg p82may IF not better, come sooner - might need cxr or scan given usage of Actmera we need to be concerned about opportunisitc infection  #Chest tightness  - stress test 2013 for heart was normal; need to keep an eye on this  #Followup  2 months with full PFT (instead of 6 months from Jan 2015 as previously stated) Consider bactrim for pcp prophylaxis at followup    OV 09/12/2013  Chief Complaint  Patient presents with  . Follow-up  after PFT for ILD. Pt states she is having SOB that she feels at time is worse. Pt states she feels a heaviness in her chest as well.     Followup dyspnea in the setting of morbid obesity, a normal cardiac stress test 2013 and rheumatoid arthritis related interstitial lung disease. Currently on prednisone, Plaquenil and actmera  She continues to have problems with dyspnea and exertion relieved by rest. Class III in severity. There is associated chest tightness. She feels that she might actually be worse than last visit. However pulmonary function test shows that she is either stable or slightly improved in terms of FEV1 and total lung capacity. She started pulmonary rehabilitation recently and has had only marginal benefit so far. She is frustrated by the lack of improvement. She's willing to undergo pulmonary stress test on a bike even though I see her walking with a cane here because she thinks he can ride the  bike  Pulmonary function test 09/08/2013: Show stability. FVC 1.78 L/77%. FEV1 1.6 L/90%. Pressure of 91/118%. Still notices restriction. Total lung capacity 3.57/70% and confirms restriction. DLCO 12.5/51% and consistent with restrictive interstitial  lung disease. If at all Fev1 and TLC are improved compared to Dec 2014    PasT: had RA flare recently; jsut finished a pred burst Review of Systems  Constitutional: Negative for fever and unexpected weight change.  HENT: Negative for congestion, dental problem, ear pain, nosebleeds, postnasal drip, rhinorrhea, sinus pressure, sneezing, sore throat and trouble swallowing.   Eyes: Negative for redness and itching.  Respiratory: Positive for shortness of breath. Negative for cough, chest tightness and wheezing.   Cardiovascular: Negative for palpitations and leg swelling.  Gastrointestinal: Negative for nausea and vomiting.  Genitourinary: Negative for dysuria.  Musculoskeletal: Negative for joint swelling.  Skin: Negative for rash.  Neurological: Negative for headaches.  Hematological: Does not bruise/bleed easily.  Psychiatric/Behavioral: Negative for dysphoric mood. The patient is not nervous/anxious.        Objective:   Physical Exam  Filed Vitals:   09/12/13 1107  Height: _0  (1.626 m)  Weight: 291 lb (131.997 kg)    Filed Vitals:   09/12/13 1107  BP: 160/96  Pulse: 101  Height: _1  (1.626 m)  Weight: 291 lb (131.997 kg)  SpO2: 93%    chrnically ill looking, obese  HEENT: nl dentition, turbinates, and orophanx. Nl external ear canals without cough reflex   NECK :  without JVD/Nodes/TM/ nl carotid upstrokes bilaterally   LUNGS: no acc muscle use, clear to A and P bilaterally without cough on insp or exp maneuvers   CV:  RRR  no s3 or murmur or increase in P2, tr  edema   ABD:  soft and nontender with nl excursion in the supine position. No bruits or organomegaly, bowel sounds nl  MS:  warm without deformities, calf tenderness, cyanosis or clubbing. USES CANE  SKIN: warm and dry without lesions    NEURO:  alert, approp, no deficits         Assessment & Plan:

## 2013-09-12 NOTE — Patient Instructions (Addendum)
Do Pulmonary STress test with EIB challenge by Mr Cheri Rous at [facility] rEturn to see me or my NP Tammy after that

## 2013-09-12 NOTE — Progress Notes (Signed)
PFT done today.

## 2013-09-13 ENCOUNTER — Encounter (HOSPITAL_COMMUNITY)
Admission: RE | Admit: 2013-09-13 | Discharge: 2013-09-13 | Disposition: A | Discharge status: Home / Self Care | Payer: Medicare Other | Source: Ambulatory Visit | Attending: Internal Medicine | Admitting: Internal Medicine

## 2013-09-13 ENCOUNTER — Encounter (HOSPITAL_COMMUNITY)
Admission: RE | Admit: 2013-09-13 | Discharge: 2013-09-13 | Disposition: A | Discharge status: Home / Self Care | Payer: Medicare Other | Source: Ambulatory Visit | Attending: Rheumatology | Admitting: Rheumatology

## 2013-09-13 MED ORDER — ACETAMINOPHEN 325 MG PO TABS
650.0000 mg | ORAL_TABLET | ORAL | Status: DC
  Administered 2013-09-13: 650 mg via ORAL

## 2013-09-13 MED ORDER — TOCILIZUMAB 400 MG/20ML IV SOLN
480.0000 mg | INTRAVENOUS | Status: DC
  Administered 2013-09-13: 480 mg via INTRAVENOUS
  Filled 2013-09-13: qty 24

## 2013-09-13 MED ORDER — SODIUM CHLORIDE 0.9 % IV SOLN
INTRAVENOUS | Status: DC
Start: 2013-09-13 — End: 2013-09-14
  Administered 2013-09-13: 13:00:00 via INTRAVENOUS

## 2013-09-13 MED ORDER — DIPHENHYDRAMINE HCL 25 MG PO CAPS
25.0000 mg | ORAL_CAPSULE | ORAL | Status: DC
  Administered 2013-09-13: 25 mg via ORAL

## 2013-09-13 NOTE — Progress Notes (Signed)
Today, [name] exercised at [facility]. [facility]. Service time was from 1030 to 1215.  The patient exercised for more than 31 minutes performing aerobic, strengthening, and stretching exercises. Oxygen saturation, heart rate, blood pressure, rate of perceived exertion, and shortness of breath were all monitored before, during, and after exercise. [name] presented with no problems at today's exercise session.  See nutrition consult note regarding patient stopping Tradjenta.    There was no workload change during today's exercise session.  Pre-exercise vitals:   Weight kg: 133.6   Liters of O2: RA   SpO2: 91   HR: 106   BP: 124/62   CBG: 100  Exercise vitals:   Highest heartrate:  110   Lowest oxygen saturation: 95   Highest blood pressure: 130/62   Liters of 02: 2  Post-exercise vitals:   SpO2: 98   HR: 98   BP: 126/74   Liters of O2: 2   CBG: N/A  Patient stopped Tradjenta 3 days ago Dr. Brand Males, Medical Director Dr. Sheran Fava is immediately available during today's Pulmonary Rehab session for [name] on 09/13/2013 at 1030 class time.

## 2013-09-13 NOTE — Progress Notes (Signed)
Naidelin E [name] 73 y.o. female  Brief Nutrition Note  Spoke with Rosebud Poles, RN regarding that pt has stopped taking her Tradjenta medication 5 days ago. Pt reports she has stopped Tradjenta 3 days ago due to trying to keep her blood sugar at a range to exercise at [facility]. [facility]. Pt reports her fasting blood sugar over the past couple of days have been 100 mg/dL. Pt reports on 09/08/13 when she tried to increase her blood sugar in order to exercise in class caused her blood sugar to be 150 mg/dL at the end of the day where she started to feel and become sick. Pt reports she does not want to try to keep "stuffing" herself with food to help increase her blood sugar before class as she reports it never gets above 100 mg/dL, which is why she has stopped taking her Tradjenta. Pt reports she plans to continue to stop taking her Tradjenta up until her next doctors visit in May with her PCP Dr. Glendale Chard where her A1c will be checked. Will notify her PCP about this situation. Pt was encouraged to keep checking her blood sugar often to make sure blood sugar is still within normal range and to notifiy her doctor if a problem occurs. Pt expressed understanding.   Kallie Locks Dietetic Intern  Derek Mound, M.Ed, RD, LDN, CDE 09/13/2013 11:59 AM

## 2013-09-14 DIAGNOSIS — M1710 Unilateral primary osteoarthritis, unspecified knee: Secondary | ICD-10-CM | POA: Diagnosis not present

## 2013-09-14 NOTE — Assessment & Plan Note (Signed)
She has dyspnea beyond what I can account for her by her ILD. I think obesity and undiagnosed diast0lic dysfunction might be playing a role. To sort this out, will do CPST bike test. She is willing to try undergoing the test  (> 50% of this 15 min visit spent in face to face counseling)

## 2013-09-15 ENCOUNTER — Encounter (HOSPITAL_COMMUNITY)
Admission: RE | Admit: 2013-09-15 | Discharge: 2013-09-15 | Disposition: A | Discharge status: Home / Self Care | Payer: Medicare Other | Source: Ambulatory Visit | Attending: Internal Medicine | Admitting: Internal Medicine

## 2013-09-15 NOTE — Progress Notes (Signed)
Today, [name] exercised at [facility]. [facility]. Service time was from 10:30 to 12:15.  The patient exercised for more than 31 minutes performing aerobic, strengthening, and stretching exercises. Oxygen saturation, heart rate, blood pressure, rate of perceived exertion, and shortness of breath were all monitored before, during, and after exercise. [place] presented with no problems at today's exercise session. The patient attended the education class "Exercise for the Pulmonary Patient" today.  There was an increase in workload change during today's exercise session.  Pre-exercise vitals:   Weight kg: 134.0   Liters of O2: 2   SpO2: 97   HR: 93   BP: 122/70   CBG: 102  Exercise vitals:   Highest heartrate:  96   Lowest oxygen saturation: 97   Highest blood pressure: 130/60   Liters of 02: 2  Post-exercise vitals:   SpO2: 99   HR: 87   BP: 106/60   Liters of O2: 2   CBG: Did not take  Dr. Brand Males, Medical Director Dr. Aileen Fass is immediately available during today's Pulmonary Rehab session for [name] on 09/15/13 at 10:30am class time.

## 2013-09-20 ENCOUNTER — Encounter (HOSPITAL_COMMUNITY)
Admission: RE | Admit: 2013-09-20 | Discharge: 2013-09-20 | Disposition: A | Discharge status: Home / Self Care | Payer: Medicare Other | Source: Ambulatory Visit | Attending: Internal Medicine | Admitting: Internal Medicine

## 2013-09-20 DIAGNOSIS — I10 Essential (primary) hypertension: Secondary | ICD-10-CM | POA: Diagnosis not present

## 2013-09-20 DIAGNOSIS — Z5189 Encounter for other specified aftercare: Secondary | ICD-10-CM | POA: Insufficient documentation

## 2013-09-20 DIAGNOSIS — M545 Low back pain, unspecified: Secondary | ICD-10-CM | POA: Insufficient documentation

## 2013-09-20 DIAGNOSIS — R262 Difficulty in walking, not elsewhere classified: Secondary | ICD-10-CM | POA: Insufficient documentation

## 2013-09-20 DIAGNOSIS — E669 Obesity, unspecified: Secondary | ICD-10-CM | POA: Diagnosis not present

## 2013-09-20 DIAGNOSIS — R079 Chest pain, unspecified: Secondary | ICD-10-CM | POA: Diagnosis not present

## 2013-09-20 DIAGNOSIS — J8410 Pulmonary fibrosis, unspecified: Secondary | ICD-10-CM | POA: Insufficient documentation

## 2013-09-20 DIAGNOSIS — M76899 Other specified enthesopathies of unspecified lower limb, excluding foot: Secondary | ICD-10-CM | POA: Diagnosis not present

## 2013-09-20 NOTE — Progress Notes (Signed)
Today, [name] exercised at [facility]. Service time was from 1030 to 1200.  The patient exercised for more than 31 minutes performing aerobic, strengthening, and stretching exercises. Oxygen saturation, heart rate, blood pressure, rate of perceived exertion, and shortness of breath were all monitored before, during, and after exercise. [name] presented with no problems at today's exercise session.   There was a workload change during today's exercise session.  Pre-exercise vitals:   Weight kg: 133.6   Liters of O2: 2   SpO2: 98   HR: 96   BP: 122/74   CBG: 110  Reported from home this morning.  She is not taking her Tradjenta.  Exercise vitals:   Highest heartrate:  107   Lowest oxygen saturation: 95   Highest blood pressure: 124/62   Liters of 02: 2  Post-exercise vitals:   SpO2: 98   HR: 80   BP: 106/60   Liters of O2: 2   CBG: NA Dr. Brand Males, Medical Director Dr. Aileen Fass is immediately available during today's Pulmonary Rehab session for [name] on 09/20/2013 at 1030 class time.

## 2013-09-21 ENCOUNTER — Ambulatory Visit (HOSPITAL_COMMUNITY): Payer: Medicare Other | Attending: Internal Medicine

## 2013-09-21 DIAGNOSIS — J849 Interstitial pulmonary disease, unspecified: Secondary | ICD-10-CM

## 2013-09-21 DIAGNOSIS — M1710 Unilateral primary osteoarthritis, unspecified knee: Secondary | ICD-10-CM | POA: Diagnosis not present

## 2013-09-22 ENCOUNTER — Encounter (HOSPITAL_COMMUNITY): Payer: Medicare Other

## 2013-09-23 ENCOUNTER — Encounter: Payer: Self-pay | Admitting: Physical Medicine & Rehabilitation

## 2013-09-23 ENCOUNTER — Encounter: Payer: Medicare Other | Attending: Physical Medicine & Rehabilitation | Admitting: Physical Medicine & Rehabilitation

## 2013-09-23 VITALS — BP 155/75 | HR 97 | Resp 14 | Ht 64.0 in | Wt 290.0 lb

## 2013-09-23 DIAGNOSIS — M069 Rheumatoid arthritis, unspecified: Secondary | ICD-10-CM | POA: Diagnosis not present

## 2013-09-23 DIAGNOSIS — J8410 Pulmonary fibrosis, unspecified: Secondary | ICD-10-CM | POA: Diagnosis not present

## 2013-09-23 DIAGNOSIS — IMO0002 Reserved for concepts with insufficient information to code with codable children: Secondary | ICD-10-CM | POA: Insufficient documentation

## 2013-09-23 DIAGNOSIS — M76899 Other specified enthesopathies of unspecified lower limb, excluding foot: Secondary | ICD-10-CM | POA: Diagnosis not present

## 2013-09-23 DIAGNOSIS — I10 Essential (primary) hypertension: Secondary | ICD-10-CM | POA: Diagnosis not present

## 2013-09-23 DIAGNOSIS — M5416 Radiculopathy, lumbar region: Secondary | ICD-10-CM

## 2013-09-23 DIAGNOSIS — M7062 Trochanteric bursitis, left hip: Secondary | ICD-10-CM

## 2013-09-23 DIAGNOSIS — M48061 Spinal stenosis, lumbar region without neurogenic claudication: Secondary | ICD-10-CM | POA: Diagnosis not present

## 2013-09-23 DIAGNOSIS — J849 Interstitial pulmonary disease, unspecified: Secondary | ICD-10-CM

## 2013-09-23 MED ORDER — MORPHINE SULFATE 15 MG PO TABS: 15.0000 mg | ORAL_TABLET | Freq: Four times a day (QID) | ORAL | Status: DC | PRN

## 2013-09-23 NOTE — Patient Instructions (Signed)
PLEASE CALL ME WITH ANY PROBLEMS OR QUESTIONS (#[phone]).      

## 2013-09-23 NOTE — Progress Notes (Signed)
Subjective:    Patient ID: [name], female    DOB: 01/19/1941, 73 y.o.   MRN: [id number]  HPI  Mrs. Broxton is back regarding her chronic pain. Her biggest problem has been her lungs. She continues to be sob. Pulmonology continues to work her up for the interstitial lung disease. She had a pulmonary stress test last week. She is also in pulmonary rehab and doing her exercises at home. She is using breathing strategies and pacing as well. She might need to go on oxygen with activity.   Her pain levels have been generally tolerable. She finds that her pain medications have been effective including the morphine and tramadol. She typically takes the morphine twicedaily and tramadol closer to 4x per day.  Pain Inventory Average Pain 7 Pain Right Now 7 My pain is burning  In the last 24 hours, has pain interfered with the following? General activity 8 Relation with others 8 Enjoyment of life 8 What TIME of day is your pain at its worst? varies Sleep (in general) Good  Pain is worse with: walking, bending and standing Pain improves with: rest and medication Relief from Meds: 6  Mobility walk with assistance use a cane use a walker  Function retired I need assistance with the following:  dressing, meal prep, household duties and shopping Do you have any goals in this area?  yes  Neuro/Psych bladder control problems weakness numbness tingling trouble walking depression anxiety  Prior Studies Any changes since last visit?  no  Physicians involved in your care Any changes since last visit?  no   Family History  Problem Relation Age of Onset  . Diabetes Mother   . Hypertension Father   . Diabetes Sister   . Diabetes Brother   . Hypertension Brother   . Heart attack Mother   . Lung cancer Father    History   Social History  . Marital Status: Married    Spouse Name: N/A    Number of Children: N/A  . Years of Education: N/A   Social History Main  Topics  . Smoking status: Never Smoker   . Smokeless tobacco: None  . Alcohol Use: No  . Drug Use: No  . Sexual Activity: Not Currently   Other Topics Concern  . None   Social History Narrative  . None   Past Surgical History  Procedure Laterality Date  . Abdominal hysterectomy    . Ovary surgery    . Shoulder surgery    . Brain surgery      Gamma knife 10/13. Needs repeat spring  '14  . Video bronchoscopy Bilateral 05/31/2013    Procedure: VIDEO BRONCHOSCOPY WITHOUT FLUORO;  Surgeon: Brand Males, MD;  Location: [facility];  Service: Cardiopulmonary;  Laterality: Bilateral;   Past Medical History  Diagnosis Date  . Diabetes mellitus   . Hypertension   . Thyroid disease   . Arthritis     endstage changes bilateral knees/bilateral ankles.   . High cholesterol   . 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 02/17/2012    Continue diplopia, left eye pain and left headaches.    . Depression, reactive   . RA (rheumatoid arthritis)     has been off methotreaxte since 10/13.  . Contusion of left knee     due to fall 1/14.  [name] URI (upper respiratory infection)   . Clotting disorder   . Asthma   . Carotid artery occlusion    BP 155/75  Pulse 97  Resp 14  Ht _0  (1.626 m)  Wt 290 lb (131.543 kg)  BMI 49.75 kg/m2  SpO2 94%  Opioid Risk Score:   Fall Risk Score: High Fall Risk (>13 points) (pt educated on fall risk, brochure given to pt previously)    Review of Systems  Constitutional: Positive for unexpected weight change.  Respiratory: Positive for cough, shortness of breath and wheezing.   Cardiovascular: Positive for leg swelling.  Genitourinary:       Bladder control problems  Musculoskeletal: Positive for back pain and gait problem.  Neurological: Positive for weakness and numbness.       Tingling  Psychiatric/Behavioral: The patient is nervous/anxious.        Depression  All other systems reviewed and are negative.      Objective:     Physical Exam General: Alert and oriented x 3, No apparent distress  HEENT: Head is normocephalic, atraumatic, PERRLA, EOMI, sclera anicteric, oral mucosa pink and moist, dentition intact, ext ear canals clear,  Neck: Supple without JVD or lymphadenopathy  Heart: Reg rate and rhythm. No murmurs rubs or gallops  Chest: sob with gait, sometimes at rest. Phonates well Abdomen: Soft, non-tender, non-distended, bowel sounds positive.  Extremities: No clubbing, cyanosis, 2+ edema. Pulses are 2+  Skin: Clean and intact without signs of breakdown  Neuro: Pt is cognitively appropriate with normal insight, memory, and awareness. Cranial nerves 2-12 are intact. Sensory exam is near normal in the legs... Reflexes are 2+ in all 4's. Fine motor coordination is intact. No tremors. Motor function is grossly 4 to 5/5 with pain inhbition. SLR was equivocal.  Musculoskeletal: low back and left hip still tender. . She is slightly elevated in the left pelvis. She is antalgic with gait on the left. Mild joint effusion noted right knee. Crepitus in both knees. No gross knee instability. She needed to extra time to stand from sitting.  Psych: Pt's affect is appropriate. Pt is cooperative   Assessment & Plan:   Assessment:  1. Lumbar spinal stenosis with neurogenic claudication.  2. Depression.  3. Diabetes mellitus type 2.  4. Rheumatoid arthritis. Multiple joint involvement including knees.  5. Left greater troch bursitis  6. Interstitial lung disease   Plan:  1. Pulmonary rehab has been helpful. She continues with home exercise also. It might be an issue where she needs to maintain her pulmonary status as opposed to actually gaining respiratory capacity.  2. Reviewed healthy eating.   3. MSO4 for breakthrough pain-- 1-2 per day, tramadol for less severe pain up to 3-4x per day.  4. Needs to work on pelvic and LB ROM at home.  5. Continue cymbalta to 45m bid for neuropathic and axial spine pain.  6. Follow  up with me in about 2 months. 30 minutes of face to face patient care time were spent during this visit. All questions were encouraged and answered.

## 2013-09-27 ENCOUNTER — Encounter (HOSPITAL_COMMUNITY)
Admission: RE | Admit: 2013-09-27 | Discharge: 2013-09-27 | Disposition: A | Discharge status: Home / Self Care | Payer: Medicare Other | Source: Ambulatory Visit | Attending: Internal Medicine | Admitting: Internal Medicine

## 2013-09-27 NOTE — Progress Notes (Signed)
Today, [name] exercised at [facility]. [facility]. Service time was from 10:30am to 12:00pm.  The patient exercised for more than 31 minutes performing aerobic, strengthening, and stretching exercises. Oxygen saturation, heart rate, blood pressure, rate of perceived exertion, and shortness of breath were all monitored before, during, and after exercise. [name] presented with no problems at today's exercise session.   There was no workload change during today's exercise session.  Pre-exercise vitals:   Weight kg: 134.4   Liters of O2: ra   SpO2: 95   HR: 95   BP: 118/60   CBG: 115  Exercise vitals:   Highest heartrate:  107   Lowest oxygen saturation: 95   Highest blood pressure: 130/80   Liters of 02: 2  Post-exercise vitals:   SpO2: 97   HR: 88   BP: 104/70   Liters of O2: 2   CBG: na  Dr. Brand Males, Medical Director Dr. Coralyn Pear is immediately available during today's Pulmonary Rehab session for [name] on 09/27/13 at 10:30am class time.

## 2013-09-28 DIAGNOSIS — M1710 Unilateral primary osteoarthritis, unspecified knee: Secondary | ICD-10-CM | POA: Diagnosis not present

## 2013-09-29 ENCOUNTER — Encounter (HOSPITAL_COMMUNITY)
Admission: RE | Admit: 2013-09-29 | Discharge: 2013-09-29 | Disposition: A | Discharge status: Home / Self Care | Payer: Medicare Other | Source: Ambulatory Visit | Attending: Internal Medicine | Admitting: Internal Medicine

## 2013-09-29 NOTE — Progress Notes (Signed)
Today, [name] exercised at [facility]. Service time was from 10:30 to 12:30.  The patient exercised for more than 31 minutes performing aerobic, strengthening, and stretching exercises. Oxygen saturation, heart rate, blood pressure, rate of perceived exertion, and shortness of breath were all monitored before, during, and after exercise. [name] presented with no problems at today's exercise session. The patient attended "Nutrition for the Pulmonary Patient" education class with Derek Mound.  There was no workload change during today's exercise session.  Pre-exercise vitals:   Weight kg: 134.9   Liters of O2: 2   SpO2: 99   HR: 89   BP: 104/60   CBG: 112  Exercise vitals:   Highest heartrate:  101   Lowest oxygen saturation: 94   Highest blood pressure: 124/60   Liters of 02: 2l  Post-exercise vitals:   SpO2: 99   HR: 88   BP: 102/68   Liters of O2: 2   CBG: na  Dr. Brand Males, Medical Director Dr. Aileen Fass is immediately available during today's Pulmonary Rehab session for [name] on 09/29/13 at 10:30am class time.

## 2013-10-03 DIAGNOSIS — E1129 Type 2 diabetes mellitus with other diabetic kidney complication: Secondary | ICD-10-CM | POA: Diagnosis not present

## 2013-10-03 DIAGNOSIS — I10 Essential (primary) hypertension: Secondary | ICD-10-CM | POA: Diagnosis not present

## 2013-10-03 DIAGNOSIS — J8410 Pulmonary fibrosis, unspecified: Secondary | ICD-10-CM | POA: Diagnosis not present

## 2013-10-03 DIAGNOSIS — M949 Disorder of cartilage, unspecified: Secondary | ICD-10-CM | POA: Diagnosis not present

## 2013-10-03 DIAGNOSIS — M2550 Pain in unspecified joint: Secondary | ICD-10-CM | POA: Diagnosis not present

## 2013-10-03 DIAGNOSIS — Z79899 Other long term (current) drug therapy: Secondary | ICD-10-CM | POA: Diagnosis not present

## 2013-10-03 DIAGNOSIS — E1165 Type 2 diabetes mellitus with hyperglycemia: Secondary | ICD-10-CM | POA: Diagnosis not present

## 2013-10-03 DIAGNOSIS — M899 Disorder of bone, unspecified: Secondary | ICD-10-CM | POA: Diagnosis not present

## 2013-10-03 DIAGNOSIS — I129 Hypertensive chronic kidney disease with stage 1 through stage 4 chronic kidney disease, or unspecified chronic kidney disease: Secondary | ICD-10-CM | POA: Diagnosis not present

## 2013-10-04 ENCOUNTER — Encounter (HOSPITAL_COMMUNITY)
Admission: RE | Admit: 2013-10-04 | Discharge: 2013-10-04 | Disposition: A | Discharge status: Home / Self Care | Payer: Medicare Other | Source: Ambulatory Visit | Attending: Internal Medicine | Admitting: Internal Medicine

## 2013-10-04 NOTE — Progress Notes (Signed)
Today, [name] exercised at [facility]. [facility]. Service time was from 10:30am to 12:00pm.  The patient exercised for more than 31 minutes performing aerobic, strengthening, and stretching exercises. Oxygen saturation, heart rate, blood pressure, rate of perceived exertion, and shortness of breath were all monitored before, during, and after exercise. [name] presented with no problems at today's exercise session.   There was no workload change during today's exercise session.  Pre-exercise vitals:   Weight kg: 134.3   Liters of O2: ra   SpO2: 90   HR: 102   BP: 114/60   CBG: 108  Exercise vitals:   Highest heartrate:  111   Lowest oxygen saturation: 98   Highest blood pressure: 124/70   Liters of 02: 2  Post-exercise vitals:   SpO2: 97   HR: 90   BP: 104/68   Liters of O2: 2   CBG: na  Dr. Brand Males, Medical Director Dr. Aileen Fass is immediately available during today's Pulmonary Rehab session for [name] on 10/04/13 at 10:30am class time.

## 2013-10-05 ENCOUNTER — Ambulatory Visit (INDEPENDENT_AMBULATORY_CARE_PROVIDER_SITE_OTHER)
Admission: RE | Admit: 2013-10-05 | Discharge: 2013-10-05 | Disposition: A | Discharge status: Home / Self Care | Payer: Medicare Other | Source: Ambulatory Visit | Attending: Internal Medicine | Admitting: Internal Medicine

## 2013-10-05 ENCOUNTER — Telehealth: Payer: Self-pay | Admitting: Emergency Medicine

## 2013-10-05 ENCOUNTER — Ambulatory Visit (INDEPENDENT_AMBULATORY_CARE_PROVIDER_SITE_OTHER): Payer: Medicare Other | Admitting: Internal Medicine

## 2013-10-05 ENCOUNTER — Encounter: Payer: Self-pay | Admitting: Internal Medicine

## 2013-10-05 VITALS — BP 112/80 | HR 86 | Ht 64.5 in | Wt 297.4 lb

## 2013-10-05 DIAGNOSIS — J8410 Pulmonary fibrosis, unspecified: Secondary | ICD-10-CM

## 2013-10-05 DIAGNOSIS — R0602 Shortness of breath: Secondary | ICD-10-CM

## 2013-10-05 DIAGNOSIS — J849 Interstitial pulmonary disease, unspecified: Secondary | ICD-10-CM

## 2013-10-05 DIAGNOSIS — J479 Bronchiectasis, uncomplicated: Secondary | ICD-10-CM | POA: Diagnosis not present

## 2013-10-05 NOTE — Patient Instructions (Addendum)
#  ILD and worsening shortness of breath  - not sure why you are more short of breath; weight, heart, lung, RA medication could all be playing a role  - Because we could not do bike test we need  To move to other tests - Do CT chest for worsening ILD anytime now  High Resolution CT chest without contrast on ILD protocol. Only  Dr Lorin Picket or Dr. Vinnie Langton to read -Will call with results and decide on cardiology referral for right heart cath - Do ONO test  -Start 2L oxygen at day time with exertion; portable system  #Followup  - 2 months

## 2013-10-05 NOTE — Telephone Encounter (Signed)
initial 6MWT on 08/11/13 she de-saturated to 85% on room air. THis happened at rehb. IF you want more let me know and I will ask [name] at rehab

## 2013-10-05 NOTE — Telephone Encounter (Signed)
[name] [facility] informed me to enter in [name] qualifying sats for the portable O2. If you could send the values from pulmonary rehab that they already qualified her with then I can enter them in so they can get [name]'s O2. Thanks.

## 2013-10-05 NOTE — Progress Notes (Signed)
Subjective:    Patient ID: [name], female    DOB: Jan 19, 1941, 73 y.o.   MRN: [id number]  HPI     PCP Joan Greenland, MD [facility] - [name] Cards - [name] at [facility]  HPI  IOV 04/06/2013  Chief Complaint  Patient presents with  . Pulmonary Consult    for SOb x 2 months.    73 year-old morbidly obese [name] female, nonsmoker. History of rheumatoid arthritis for several years. Referred for dyspnea  Background rheumatoid arthritis history  - Noted arthritis present for several years. Mostly affecting the joints. Follows with rheumatologist. SHe used to be on remicade with MTx 5-6 years ago. Then on Orencia with methotrexate. Last few years MTXaline and finally due to worsening jt disesae MTx stopped and she is on actmera with prednisone, colchicine, Plaquenil along with other pain medications of Neurontin, Cymbalta, morphine and Tylenol    Current history  - She is now reporting 2 months of insidious onset of shortness of breath. Several months ago she was seen by cardiologist for edema and was apparently told her heart was fine. Current dyspnea is not progressive but is of moderate to severe in intensity. Worsened by exertion relieved by rest but all the present even at rest. Changing clothes makes her dyspneic. Improved by staying still. No suicide orthopnea, chest pain. Currently the edema status is improved she says.   Dyspnea relevant Hx  - Walk test 185 feet x3 laps on room air today 04/06/2013. She only completed 2 laps and got very dyspneic and fatigued. The heart rate was 111. She desaturated to 88%  - Recollects history bronchoscopy several years ago by Joan. [name] I do not have these results with him he   -  reports that she has never smoked. She does not have any smokeless tobacco history on file.   - Body mass index is 47.39 kg/(m^2).  - Lab is rheumatologist 03/14/2013 shows a CBC white cell count of 3.7-10% used to  flows. Creatinine of 0.99 mg percent  CT 03/13/04  IMPRESSION  1. No evidence of pulmonary embolism or other acute disease.  2. Chronic bibasilar interstitial changes and shotty hilar and mediastinal lymph nodes, which are  nonspecific.  Provider: Janit Mcdaniel  OV 04/28/2013   Chief Complaint  Patient presents with  . Follow-up    Pt c/o SOB with exertion, hoarseness, wheezing.  Pt denies any congestion.     CT shows ILD. WTIh her hx of RA and  being on iimmuniosuppressive drugs: ddx is this is RA or opportunistic infection. SHe will need bronch with BAL.[name] So need quicker followup than 05/11/13 preferred with me in dec 2015 by mid-dec 2014. IF that is not poissioble, have her see TP the NP, so I Can set bronch through her. She needs to finish her other tests as well   ono 04/11/13 shows lowerst pulse ox 79% and total time </= 89% at 6 min and </= 88% at 3 min. She has very mild desat. Does not qualify for overnight oxygen  Pulmonary function test 04/28/2013 shows FEV1 1.7 L/74%. FEV1 1.46 L/81%. Ratio to 84/109%. Suggestive of restriction. Total lung capacity is 2.9 L/58% and consistent with restriction.  To discuss results: does not want portable o2. Stable dyspnea. No change in health. She is agreeable to bronch for BAL - ddx opportunistic infetin v RA-ILD. Too high risk for transbronchial bx + low yield   05/04/2013  Acute  ov/[name]  re:  ILD/ acute sob x 2 days and RA worse  Chief Complaint  Patient presents with  . Acute Visit    Pt c/o increased SOB and non prod cough x 2 days. She is using rescue inhaler at least 2-3 times per day.     Inhaler really helps Reduced pred to 5 mg daily x sev weeks prior to flare and arthritis worse when breathing worse  No real change in cough freq/ intensity since sob worse   No obvious day to day or daytime variabilty or assoc chronic cough or cp or chest tightness, subjective wheeze overt sinus or hb symptoms. No unusual exp hx or h/o  childhood pna/ asthma or knowledge of premature birth.  Sleeping ok without nocturnal  or early am exacerbation  of respiratory  c/o's or need for noct saba. Also denies any obvious fluctuation of symptoms with weather or environmental changes or other aggravating or alleviating factors except as outlined above   Telep[ehone call 06/10/13  BAL shows lot of neutrophils and ddx is acut viral/bacterial infection (Pulpotio Bareas had just completed abx Rx) or aspiration (please check on this). Main thing is no opportunisitc infection given her extensive immunosuppressive regimen. AT this time I wil not recommend change to her immunosuppressive regiment but would recommend rehab for ILD, and weight loss. Fu with me with PFT in 6 months  06/10/2013 Follow up  Returns for follow up  Had recent bronchitic flare 2 weeks ago , tx w/ Levaquin . Feeling better w/ resolved cough  Had FOB on 05/31/13  that showed inflammatory cells, no malignant cells. Cx non path flora .  On prednisone 16m daily .  She is feeling better , nearing her baseline.  No chest pain, orthopnea, hemoptysis, or increased edema.   REC May decrease Prednisone 575mdaily  We are referring you to pulmonary rehab.  May restart RA infusions as directed by Rheumatology.  Follow up Joan. RaChase Callern 6 months with PFT  Please contact office for sooner follow up if symptoms do not improve or worsen or seek emergency care.   OV 07/18/2013  Chief Complaint  Patient presents with  . Acute Visit    Pt c/o having SOB, chest tightness, wheezing, productive cough wiht yellow phlegm x 2 weeks. Pt states she was given a rochephin injection and levaquin rx by PCP.    Now on prednisone 1047mlaquenil and Actermia infusion for RA  Past 1 week having head cold, chest tighness, cough, yellow sputum, some malaise. Given rocephin at MD PCP [facility] office and now on levaquin but no better. SO, came here. DEnies fever, n, v, Mcdaniel. HAs chronic ches tighness  with exertion that bother her  But stress test 2013 was negative  #Acute bronchitis Stop levaquin  Take doxycycline 100m33m twice daily x 5 days; take after meals and avoid sunlight Take prednisone 40 mg daily x 2 days, then 30mg67mly x 2 days, then 20mg 40my x 2 days, and then to continue at 10mg p61may IF not better, come sooner - might need cxr or scan given usage of Actmera we need to be concerned about opportunisitc infection  #Chest tightness  - stress test 2013 for heart was normal; need to keep an eye on this  #Followup  2 months with full PFT (instead of 6 months from Jan 2015 as previously stated) Consider bactrim for pcp prophylaxis at followup    OV 09/12/2013  Chief Complaint  Patient presents with  .  Follow-up    after PFT for ILD. Pt states she is having SOB that she feels at time is worse. Pt states she feels a heaviness in her chest as well.     Followup dyspnea in the setting of morbid obesity, a normal cardiac stress test 2013 and rheumatoid arthritis related interstitial lung disease. Currently on prednisone, Plaquenil and actmera  She continues to have problems with dyspnea and exertion relieved by rest. Class III in severity. There is associated chest tightness. She feels that she might actually be worse than last visit. However pulmonary function test shows that she is either stable or slightly improved in terms of FEV1 and total lung capacity. She started pulmonary rehabilitation recently and has had only marginal benefit so far. She is frustrated by the lack of improvement. She's willing to undergo pulmonary stress test on a bike even though I see her walking with a cane here because she thinks he can ride the  bike  Pulmonary function test 09/08/2013: Show stability. FVC 1.78 L/77%. FEV1 1.6 L/90%. Pressure of 91/118%. Still notices restriction. Total lung capacity 3.57/70% and confirms restriction. DLCO 12.5/51% and consistent with restrictive interstitial  lung disease. If at all Fev1 and TLC are improved compared to Dec 2014   REC Do Pulmonary STress test with EIB challenge by [name] at [facility] rEturn to see me or my NP [name] after that  [place] 10/05/2013  Chief Complaint  Patient presents with  . Follow-up    Pt states breathing is unchanged. C/o SOB and chest tightness with all activity. Denies cough.   This was a followup of her cardiopulmonary stress test. \  However, could not do CPST to due to low Fev1 (see below) on day of test. She denies any worsening around that time. SHe only attests to slow steady decline in dyspnea for several months now. She has done rehab for 4 weeks without much improvement. She notices chronic desats at rehab; not worse during her time there per her hx.  She is now resigned to fact she would like to try o2 which I had advised many months ago. No new issues. She continues to have weight gain )Body mass index is 50.28 kg/(m^2). and attributes it to prednisone   Note from [place] 09/23/13 Performed PFT on Mrs. Lantis, and found her FVC to be 1.08 and FEV1 to be 1.01. This was a significant decline from just a week ago. She reported feeling more dyspnea and chest tightness this morning and took 2 puffs of albuterol when she got up this morning. SVC was also reduced 1.39 L and MVV was 42 L/min. She gave a good effort on everything. I tried to call your cell, but was sent to directly to voicemail, which has not been set up. I tried to page you twice, but did not hear back from you. I canceled the CPX test in light of a significant decrease in lung function and increased symptoms. If you would like her to have the CPX, please have your office call and reschedule.    Email 10/04/13 from [name] of rehab Your patien, [name] DOB: 1941/04/25 is currently on 2 liters of oxygen at Pulmonary Rehab.  During her initial 6MWT on 08/11/13 she de-saturated to 85% on room air.  2 liters kept her above  90%.  Review of Systems  Constitutional: Negative for fever and unexpected weight change.  HENT: Negative for congestion, dental problem, ear pain, nosebleeds, postnasal drip,  rhinorrhea, sinus pressure, sneezing, sore throat and trouble swallowing.   Eyes: Negative for redness and itching.  Respiratory: Positive for chest tightness and shortness of breath. Negative for cough and wheezing.   Cardiovascular: Negative for palpitations and leg swelling.  Gastrointestinal: Negative for nausea and vomiting.  Genitourinary: Negative for dysuria.  Musculoskeletal: Negative for joint swelling.  Skin: Negative for rash.  Neurological: Negative for headaches.  Hematological: Does not bruise/bleed easily.  Psychiatric/Behavioral: Negative for dysphoric mood. The patient is not nervous/anxious.        Objective:   Physical Exam   Filed Vitals:   10/05/13 1041  BP: 112/80  Pulse: 86  Height: 5' 4.5" (1.638 m)  Weight: 297 lb 6.4 oz (134.9 kg)  SpO2: 95%    chrnically ill looking, obese  HEENT: nl dentition, turbinates, and orophanx. Nl external ear canals without cough reflex   NECK :  without JVD/Nodes/TM/ nl carotid upstrokes bilaterally   LUNGS: no acc muscle use, clear to A and P bilaterally without cough on insp or exp maneuvers   CV:  RRR  no s3 or murmur or increase in P2, tr  edema   ABD:  soft and nontender with nl excursion in the supine position. No bruits or organomegaly, bowel sounds nl  MS:  warm without deformities, calf tenderness, cyanosis or clubbing. USES CANE  SKIN: warm and dry without lesions    NEURO:  alert, approp, no deficits          Assessment & Plan:

## 2013-10-06 ENCOUNTER — Encounter (HOSPITAL_COMMUNITY)
Admission: RE | Admit: 2013-10-06 | Discharge: 2013-10-06 | Disposition: A | Discharge status: Home / Self Care | Payer: Medicare Other | Source: Ambulatory Visit | Attending: Internal Medicine | Admitting: Internal Medicine

## 2013-10-06 DIAGNOSIS — R0902 Hypoxemia: Secondary | ICD-10-CM | POA: Diagnosis not present

## 2013-10-06 NOTE — Progress Notes (Signed)
Patient performed a 6 minute walk test that was performed on 08/11/2013 @ 3:30 pm.   Patient walked 436 feet in 6 minutes. At rest on room air heart rate 103, oxygen saturation 93%. After walking 2 minutes on room air she desaturated to 85%, heart rate 110. She was then placed on 2 liters of oxygen and on exertion peakheart rate was 115 and oxygen saturation was 96%. At rest on 2 liters heart rate 102 and saturation 98%.

## 2013-10-06 NOTE — Progress Notes (Signed)
6 minute walk test was performed.  Patient was heart rate 89 , oxygen saturation 98% on room air at rest.  At the 3 minute mark patient desaturated to 87% on room air with heart rate 112, but patient began purse lip breathing on her own and saturation increased to 91% and then back down to 89% with a heart rate of 126.   Patient was able to walk a total distance of 800 feet.  She was never placed on oxygen and after 2 minutes of rest heart rate decreased to 107 and saturations increased to 98%.

## 2013-10-06 NOTE — Progress Notes (Signed)
Another 6 minute walk test was performed this time we placed patient on oxygen when she desaturated below 88%.  Resting heart rate 85, blood pressure 104/60, oxygen saturation 93% on room air.  She was able to walk a total of 475 feet and at the 4 minute mark she desaturated to 86% with heart rate of 116, at this time she was placed on 2 liters of oxygen and saturations increased to a peak of 96% with heart rate of 98.  After 2 minutes of rest on 2 liters heart rate decreased to 94 and saturation increased to 98%.

## 2013-10-07 ENCOUNTER — Other Ambulatory Visit (HOSPITAL_COMMUNITY): Payer: Self-pay | Admitting: *Deleted

## 2013-10-07 NOTE — Telephone Encounter (Signed)
New qualifying sats are to follow:   RA resting: 93% RA ambulating: 85% 2L ambulating: 96%  Will inform [facility] and enter information into patient care coordination notes.

## 2013-10-10 ENCOUNTER — Telehealth: Payer: Self-pay | Admitting: Internal Medicine

## 2013-10-10 DIAGNOSIS — R0600 Dyspnea, unspecified: Secondary | ICD-10-CM

## 2013-10-10 NOTE — Assessment & Plan Note (Signed)
#  ILD and worsening shortness of breath  - not sure why you are more short of breath; weight, heart, lung, RA medication could all be playing a role - Becoming very difficult to sort out etiology due to obesity and inability to perform CPST  PLAN  - Because we could not do bike test we need  To move to other tests - Do CT chest for worsening ILD anytime now  High Resolution CT chest without contrast on ILD protocol. Only  Dr Lorin Picket or Dr. Vinnie Langton to read -Will call with results and decide on cardiology referral for right heart cath - Do ONO test  -Start 2L oxygen at day time with exertion; portable system  #Followup  - 2 months; might have to consider right heart cath depending on CT result  > 50% of this > 25 min visit spent in face to face counseling (15 min visit converted to 25 min)

## 2013-10-10 NOTE — Telephone Encounter (Addendum)
Elise  1, Check her walking desats results from rehab on Epic and order home o2 portable  2. Also CT chest 10/05/13 - ILD is stable. Let her know that and that I would like her to see cardiology to rule out cardiac cause for dyspnea (I did order)  Thanks  Dr. Brand Males, M.D., [facility].C.P Pulmonary and Critical Care Medicine Staff Physician [place] [facility] Pager: [phone], If no answer or between  15:00h - 7:00h: call [phone]  10/10/2013 3:34 AM

## 2013-10-11 ENCOUNTER — Ambulatory Visit (HOSPITAL_COMMUNITY)
Admission: RE | Admit: 2013-10-11 | Discharge: 2013-10-11 | Disposition: A | Discharge status: Home / Self Care | Payer: Medicare Other | Source: Ambulatory Visit | Attending: Rheumatology | Admitting: Rheumatology

## 2013-10-11 ENCOUNTER — Encounter (HOSPITAL_COMMUNITY)
Admission: RE | Admit: 2013-10-11 | Discharge: 2013-10-11 | Disposition: A | Discharge status: Home / Self Care | Payer: Medicare Other | Source: Ambulatory Visit | Attending: Internal Medicine | Admitting: Internal Medicine

## 2013-10-11 DIAGNOSIS — M1990 Unspecified osteoarthritis, unspecified site: Secondary | ICD-10-CM | POA: Diagnosis not present

## 2013-10-11 DIAGNOSIS — Z5181 Encounter for therapeutic drug level monitoring: Secondary | ICD-10-CM | POA: Insufficient documentation

## 2013-10-11 DIAGNOSIS — M069 Rheumatoid arthritis, unspecified: Secondary | ICD-10-CM | POA: Diagnosis not present

## 2013-10-11 MED ORDER — TOCILIZUMAB 400 MG/20ML IV SOLN
480.0000 mg | INTRAVENOUS | Status: DC
  Filled 2013-10-11: qty 24

## 2013-10-11 MED ORDER — SODIUM CHLORIDE 0.9 % IV SOLN: INTRAVENOUS | Status: DC

## 2013-10-11 MED ORDER — ACETAMINOPHEN 325 MG PO TABS: 650.0000 mg | ORAL_TABLET | ORAL | Status: DC

## 2013-10-11 MED ORDER — DIPHENHYDRAMINE HCL 25 MG PO CAPS: 25.0000 mg | ORAL_CAPSULE | ORAL | Status: DC

## 2013-10-11 NOTE — Telephone Encounter (Signed)
Pt returned call, spoke to pt regarding recs per MR. Pt verbalized understanding and denied any further questions or concerns at this time.

## 2013-10-11 NOTE — Progress Notes (Signed)
Today, [name] exercised at [facility]. [facility]. Service time was from 1030 to 1200.  The patient exercised for more than 31 minutes performing aerobic, strengthening, and stretching exercises. Oxygen saturation, heart rate, blood pressure, rate of perceived exertion, and shortness of breath were all monitored before, during, and after exercise. [name] presented with no problems at today's exercise session.   There was no workload change during today's exercise session.  Pre-exercise vitals:   Weight kg: 134.9   Liters of O2: 2   SpO2: 96   HR: 90   BP: 124/70   CBG: NA  Exercise vitals:   Highest heartrate:  95   Lowest oxygen saturation: 97   Highest blood pressure: 122/84   Liters of 02: 2  Post-exercise vitals:   SpO2: 99   HR: 85   BP: 132/60   Liters of O2: 2   CBG: NA Dr. Brand Males, Medical Director Dr. Dyann Kief is immediately available during today's Pulmonary Rehab session for Fannie E Hinostroza on 10/11/2013 at 1030 class time.

## 2013-10-11 NOTE — Telephone Encounter (Signed)
lmtcb

## 2013-10-12 ENCOUNTER — Encounter (HOSPITAL_COMMUNITY)
Admission: RE | Admit: 2013-10-12 | Discharge: 2013-10-12 | Disposition: A | Discharge status: Home / Self Care | Payer: Medicare Other | Source: Ambulatory Visit | Attending: Rheumatology | Admitting: Rheumatology

## 2013-10-12 ENCOUNTER — Telehealth: Payer: Self-pay | Admitting: Internal Medicine

## 2013-10-12 DIAGNOSIS — J849 Interstitial pulmonary disease, unspecified: Secondary | ICD-10-CM

## 2013-10-12 MED ORDER — DIPHENHYDRAMINE HCL 25 MG PO CAPS: 25.0000 mg | ORAL_CAPSULE | ORAL | Status: DC

## 2013-10-12 MED ORDER — SODIUM CHLORIDE 0.9 % IV SOLN
INTRAVENOUS | Status: DC
  Administered 2013-10-12: 12:00:00 via INTRAVENOUS

## 2013-10-12 MED ORDER — TOCILIZUMAB 400 MG/20ML IV SOLN
480.0000 mg | INTRAVENOUS | Status: DC
  Administered 2013-10-12: 480 mg via INTRAVENOUS
  Filled 2013-10-12: qty 24

## 2013-10-12 MED ORDER — SODIUM CHLORIDE 0.9 % IV SOLN: INTRAVENOUS | Status: DC

## 2013-10-12 MED ORDER — ACETAMINOPHEN 325 MG PO TABS: 650.0000 mg | ORAL_TABLET | ORAL | Status: DC

## 2013-10-12 MED ORDER — TOCILIZUMAB 400 MG/20ML IV SOLN: 480.0000 mg | INTRAVENOUS | Status: DC

## 2013-10-12 NOTE — Telephone Encounter (Signed)
[name] - cannot remember if she is on nocturnal o2 . Her ONO on RA 10/06/13 show desatruation. Mean pulse ox 82.7%, time < 88% at 3h and 14 sec.   So, start 2L [place] at night  Dr. Brand Males, M.D., [facility].C.P Pulmonary and Critical Care Medicine Staff Physician [place] [facility] Pager: [phone], If no answer or between  15:00h - 7:00h: call [phone]  10/12/2013 1:37 PM

## 2013-10-13 ENCOUNTER — Encounter (HOSPITAL_COMMUNITY)
Admission: RE | Admit: 2013-10-13 | Discharge: 2013-10-13 | Disposition: A | Discharge status: Home / Self Care | Payer: Medicare Other | Source: Ambulatory Visit | Attending: Internal Medicine | Admitting: Internal Medicine

## 2013-10-13 NOTE — Progress Notes (Signed)
I have reviewed a Home Exercise Prescription with Satara E Navarra . Idabell is currently stretching and using her resistance bands at home.  The patient has just recently gotten oxygen for home.  The patient was advised to walk 2 days a week for 20 minutes inside her home.  Nyaisha and I discussed how to progress their exercise prescription.  The patient stated that their goals were to be able to run errands easier, build stamina, be able to cook without becoming o shprt of breath, and be able to do chores easier.  The patient stated that they understand the exercise prescription.  We reviewed exercise guidelines, target heart rate during exercise, oxygen use, weather, home pulse oximeter, endpoints for exercise, and goals.  Patient is encouraged to come to me with any questions. I will continue to follow up with the patient to assist them with progression and safety.

## 2013-10-13 NOTE — Progress Notes (Signed)
Today, [name] exercised at [facility]. [facility]. Service time was from 1030 to 1215.  The patient exercised for more than 31 minutes performing aerobic, strengthening, and stretching exercises. Oxygen saturation, heart rate, blood pressure, rate of perceived exertion, and shortness of breath were all monitored before, during, and after exercise. [name] presented with no problems at today's exercise session. Patient attended the class on signs and symptoms of infection.    There was no workload change during today's exercise session.  Pre-exercise vitals:   Weight kg: 135.1   Liters of O2: 2   SpO2: 98   HR: 91   BP: 114/60   CBG: NA  Exercise vitals:   Highest heartrate:  110   Lowest oxygen saturation: 93   Highest blood pressure: 130/70   Liters of 02: 2  Post-exercise vitals:   SpO2: 99   HR: 90   BP: 114/62   Liters of O2: 2   CBG: NA Dr. Brand Males, Medical Director Dr. Aileen Fass is immediately available during today's Pulmonary Rehab session for [name] on 10/13/2013 at 1030 class time.

## 2013-10-14 NOTE — Telephone Encounter (Signed)
Unable to leave vm. WCB.  Order has been placed for nocturnal O2 for 2L continuous through [facility].

## 2013-10-18 ENCOUNTER — Encounter (HOSPITAL_COMMUNITY)
Admission: RE | Admit: 2013-10-18 | Discharge: 2013-10-18 | Disposition: A | Discharge status: Home / Self Care | Payer: Medicare Other | Source: Ambulatory Visit | Attending: Internal Medicine | Admitting: Internal Medicine

## 2013-10-18 DIAGNOSIS — E669 Obesity, unspecified: Secondary | ICD-10-CM | POA: Diagnosis not present

## 2013-10-18 DIAGNOSIS — R262 Difficulty in walking, not elsewhere classified: Secondary | ICD-10-CM | POA: Diagnosis not present

## 2013-10-18 DIAGNOSIS — M545 Low back pain, unspecified: Secondary | ICD-10-CM | POA: Insufficient documentation

## 2013-10-18 DIAGNOSIS — I10 Essential (primary) hypertension: Secondary | ICD-10-CM | POA: Diagnosis not present

## 2013-10-18 DIAGNOSIS — M76899 Other specified enthesopathies of unspecified lower limb, excluding foot: Secondary | ICD-10-CM | POA: Insufficient documentation

## 2013-10-18 DIAGNOSIS — Z5189 Encounter for other specified aftercare: Secondary | ICD-10-CM | POA: Insufficient documentation

## 2013-10-18 DIAGNOSIS — J8410 Pulmonary fibrosis, unspecified: Secondary | ICD-10-CM | POA: Diagnosis not present

## 2013-10-18 DIAGNOSIS — R079 Chest pain, unspecified: Secondary | ICD-10-CM | POA: Diagnosis not present

## 2013-10-18 NOTE — Progress Notes (Signed)
Today, [name] exercised at [facility]. [facility]. Service time was from 1030 to 1215.  The patient exercised for more than 31 minutes performing aerobic, strengthening, and stretching exercises. Oxygen saturation, heart rate, blood pressure, rate of perceived exertion, and shortness of breath were all monitored before, during, and after exercise. [name] presented with no problems at today's exercise session.   There was a workload change during today's exercise session.  Pre-exercise vitals:   Weight kg: 135   Liters of O2: ra   SpO2: 92   HR: 95   BP: 110/60   CBG: na  Exercise vitals:   Highest heartrate:  111   Lowest oxygen saturation: 95   Highest blood pressure: 100/60   Liters of 02: 2L  Post-exercise vitals:   SpO2: 96   HR: 93   BP: 98/70   Liters of O2: 2L   CBG: na  Dr. Brand Males, Medical Director Dr. Aileen Fass is immediately available during today's Pulmonary Rehab session for Carleen E Kurt on 10/18/2013 at 1030 class time.

## 2013-10-19 NOTE — Telephone Encounter (Signed)
Called Mr. Hansman to speak to [facility]. Spoke to pt regarding results and nocturnal O2. Pt states she is already set up with the O2 and is wearing with exertion and at night. Pt verbalized understanding and denied any further questions or concerns at this time.

## 2013-10-20 ENCOUNTER — Encounter (HOSPITAL_COMMUNITY)
Admission: RE | Admit: 2013-10-20 | Discharge: 2013-10-20 | Disposition: A | Discharge status: Home / Self Care | Payer: Medicare Other | Source: Ambulatory Visit | Attending: Internal Medicine | Admitting: Internal Medicine

## 2013-10-20 NOTE — Progress Notes (Signed)
Today, Bonnita exercised at [facility]. [facility]. Service time was from 1030 to 1215.  The patient exercised for more than 31 minutes performing aerobic, strengthening, and stretching exercises. Oxygen saturation, heart rate, blood pressure, rate of perceived exertion, and shortness of breath were all monitored before, during, and after exercise. Denee presented with no problems at today's exercise session. Reina also attended an eduction session on exercise for the pulmonary patient.  There was a workload change during today's exercise session.  Pre-exercise vitals:   Weight kg: 135.9   Liters of O2: 2L   SpO2: 99   HR: 84   BP: 108/78   CBG: na  Exercise vitals:   Highest heartrate:  104   Lowest oxygen saturation: 95   Highest blood pressure: 122/76   Liters of 02: 2L  Post-exercise vitals:   SpO2: 98   HR: 85   BP: 114/60   Liters of O2: 2L   CBG: na  Dr. Brand Males, Medical Director Dr. Dyann Kief is immediately available during today's Pulmonary Rehab session for Eloise E Britain on 10/20/2013 at 1030 class time.

## 2013-10-24 ENCOUNTER — Telehealth: Payer: Self-pay | Admitting: Cardiovascular Disease

## 2013-10-24 NOTE — Telephone Encounter (Signed)
Closed encounter °

## 2013-10-25 ENCOUNTER — Encounter (HOSPITAL_COMMUNITY)
Admission: RE | Admit: 2013-10-25 | Discharge: 2013-10-25 | Disposition: A | Discharge status: Home / Self Care | Payer: Medicare Other | Source: Ambulatory Visit | Attending: Internal Medicine | Admitting: Internal Medicine

## 2013-10-25 ENCOUNTER — Encounter: Payer: Self-pay | Admitting: Cardiovascular Disease

## 2013-10-25 ENCOUNTER — Ambulatory Visit (INDEPENDENT_AMBULATORY_CARE_PROVIDER_SITE_OTHER): Payer: Medicare Other | Admitting: Cardiovascular Disease

## 2013-10-25 ENCOUNTER — Encounter (HOSPITAL_COMMUNITY): Payer: Self-pay | Admitting: *Deleted

## 2013-10-25 VITALS — BP 147/90 | HR 92 | Ht 64.0 in | Wt 300.5 lb

## 2013-10-25 DIAGNOSIS — R0609 Other forms of dyspnea: Secondary | ICD-10-CM

## 2013-10-25 DIAGNOSIS — R0600 Dyspnea, unspecified: Secondary | ICD-10-CM

## 2013-10-25 DIAGNOSIS — R0989 Other specified symptoms and signs involving the circulatory and respiratory systems: Secondary | ICD-10-CM | POA: Diagnosis not present

## 2013-10-25 DIAGNOSIS — I10 Essential (primary) hypertension: Secondary | ICD-10-CM

## 2013-10-25 DIAGNOSIS — R079 Chest pain, unspecified: Secondary | ICD-10-CM | POA: Diagnosis not present

## 2013-10-25 NOTE — Assessment & Plan Note (Signed)
Patient was referred by [name] for evaluation of chest pain or shortness of breath. She has a history of hypertension and a strong family history of heart disease with a mother who died of an MI at age 72 and 2 brothers who have coronary artery disease. She is not diabetic. She never smoked. She does have interstitial lung disease and rheumatoid arthritis who has had progressive dyspnea on exertion now and each bedtime O2. She's had chest pain over the last year or. The pain is somewhat atypical but occurs on a daily basis. She saw Dr. Einar Gip year ago who did a 2-D echo and a Myoview stress test although the results aren't available to me. Given her risk factor profile and the progressive nature of her symptoms as well as the fact that [name] does not believe that her shortness of breath his explainable by her lung disease I would repeat a 2-D echo and a pharmacologic Myoview stress test (dobutamine). I will see her back after that for further evaluation

## 2013-10-25 NOTE — Assessment & Plan Note (Signed)
Currently not on statin therapy

## 2013-10-25 NOTE — Assessment & Plan Note (Addendum)
Controlled on current medications

## 2013-10-25 NOTE — Progress Notes (Signed)
10/25/2013 [name]   1940-12-05  [id number]  Primary Physician Joan Greenland, MD Primary Cardiologist: Joan Harp MD [name]   HPI:  [name] is a 73 year old moderately overweight married African American female mother of 6 children, grandmother of a grandchildren referred by Dr. Chase Mcdaniel for cardiovascular evaluation because of progressive dyspnea on exertion and chest pain. Er cardiac cytopathologist notable for a strong family history of heart disease with her mother and 51 brothers old with an ischemic heart disease. She has never had a heart attack or stroke. She does have interstitial lung disease as well as rheumatoid arthritis. She is on home oxygen. She also has a brain tumor/meningioma 2 with gamma knife therapy She was evaluated by a cardiologist either go with a limited 2-D echocardiogram and astress test and was told that these were normal. She's had progressive disabling dyspnea as well as daily chest pain.   Current Outpatient Prescriptions  Medication Sig Dispense Refill  . acetaminophen (TYLENOL) 500 MG tablet Take 1,000 mg by mouth 2 (two) times daily as needed for pain. Takes with tramadol      . albuterol (PROVENTIL HFA;VENTOLIN HFA) 108 (90 BASE) MCG/ACT inhaler Inhale 2 puffs into the lungs every 6 (six) hours as needed for wheezing or shortness of breath.      . allopurinol (ZYLOPRIM) 100 MG tablet Take 200 mg by mouth daily.      [name] aspirin EC 81 MG tablet Take 81 mg by mouth daily.      [name] atorvastatin (LIPITOR) 20 MG tablet Take 20 mg by mouth daily.      . Calcium Carb-Cholecalciferol (CALCIUM + D3 PO) Take 2 tablets by mouth daily.      . chlorpheniramine-HYDROcodone (TUSSIONEX PENNKINETIC ER) 10-8 MG/5ML LQCR Take 5 mLs by mouth every 12 (twelve) hours as needed for cough.  120 mL  0  . colchicine 0.6 MG tablet Take 0.6 mg by mouth daily as needed (GOUT).      [name] dextromethorphan (DELSYM) 30 MG/5ML liquid Take by mouth as needed  for cough.      . DULoxetine (CYMBALTA) 60 MG capsule Take 120 mg by mouth daily.      . famotidine (PEPCID) 20 MG tablet take 1 tablet by mouth at bedtime  30 tablet  5  . gabapentin (NEURONTIN) 600 MG tablet Take 1 tablet (600 mg total) by mouth as directed. Take 2 tablet in the morning, 2 tablet in the middle of the day, and 3 tablets at night  210 tablet  1  . hydroxychloroquine (PLAQUENIL) 200 MG tablet Take 200 mg by mouth 2 (two) times daily.       [name] levothyroxine (SYNTHROID, LEVOTHROID) 50 MCG tablet Take 1 tablet (50 mcg total) by mouth daily.  30 tablet  1  . linagliptin (TRADJENTA) 5 MG TABS tablet Take 5 mg by mouth daily.      . metoprolol succinate (TOPROL-XL) 100 MG 24 hr tablet Take 100 mg by mouth daily. Take with or immediately following a meal.      . morphine (MSIR) 15 MG tablet Take 1 tablet (15 mg total) by mouth every 6 (six) hours as needed.  60 tablet  0  . Multiple Vitamin (MULTIVITAMIN WITH MINERALS) TABS Take 1 tablet by mouth daily.      [name] olmesartan (BENICAR) 40 MG tablet Take 40 mg by mouth daily.      . OXYGEN-HELIUM IN Inhale 2 L into the lungs at  bedtime.      . pantoprazole (PROTONIX) 40 MG tablet take 1 tablet by mouth once daily 30 TO 60 MINUTES BEFORE 1ST MEAL OF THE DAY  30 tablet  5  . predniSONE (DELTASONE) 10 MG tablet Take 10 mg by mouth daily with breakfast.      . Spacer/Aero-Holding Chambers (AEROCHAMBER MV) inhaler Use as instructed  1 each  0  . traMADol (ULTRAM) 50 MG tablet Take 1 tablet (50 mg total) by mouth every 4 (four) hours as needed.  120 tablet  3  . UNABLE TO FIND Inject 400 mLs as directed every 30 (thirty) days. Atemira-IV solution monthly      . Vitamin D, Ergocalciferol, (DRISDOL) 50000 UNITS CAPS capsule Take 50,000 Units by mouth every 7 (seven) days.       No current facility-administered medications for this visit.    Allergies  Allergen Reactions  . Codeine Swelling    Facial swelling  . Remicade [Infliximab]     "sent me  into shock"  . Zestril [Lisinopril] Swelling    Face and neck swelling    History   Social History  . Marital Status: Married    Spouse Name: N/A    Number of Children: N/A  . Years of Education: N/A   Occupational History  . Not on file.   Social History Main Topics  . Smoking status: Never Smoker   . Smokeless tobacco: Not on file  . Alcohol Use: No  . Drug Use: No  . Sexual Activity: Not Currently   Other Topics Concern  . Not on file   Social History Narrative  . No narrative on file     Review of Systems: General: negative for chills, fever, night sweats or weight changes.  Cardiovascular: negative for chest pain, dyspnea on exertion, edema, orthopnea, palpitations, paroxysmal nocturnal dyspnea or shortness of breath Dermatological: negative for rash Respiratory: negative for cough or wheezing Urologic: negative for hematuria Abdominal: negative for nausea, vomiting, diarrhea, bright red blood per rectum, melena, or hematemesis Neurologic: negative for visual changes, syncope, or dizziness All other systems reviewed and are otherwise negative except as noted above.    Blood pressure 147/90, pulse 92, height _0  (1.626 m), weight 300 lb 8 oz (136.306 kg), SpO2 89.00%.  General appearance: alert and no distress Neck: no adenopathy, no carotid bruit, no JVD, supple, symmetrical, trachea midline and thyroid not enlarged, symmetric, no tenderness/mass/nodules Lungs: clear to auscultation bilaterally Heart: regular rate and rhythm, S1, S2 normal, no murmur, click, rub or gallop Extremities: 3+ pitting edema bilaterally probably related to venous insufficiency  EKG normal sinus rhythm and 92 without ST or T wave changes  ASSESSMENT AND PLAN:   Hypertension Controlled on current medications  High cholesterol Currently not on statin therapy  Chest pain Patient was referred by Dr. Chase Mcdaniel for evaluation of chest pain or shortness of breath. She has a history  of hypertension and a strong family history of heart disease with a mother who died of an MI at age 72 and 2 brothers who have coronary artery disease. She is not diabetic. She never smoked. She does have interstitial lung disease and rheumatoid arthritis who has had progressive dyspnea on exertion now and each bedtime O2. She's had chest pain over the last year or. The pain is somewhat atypical but occurs on a daily basis. She saw Dr. Einar Gip year ago who did a 2-D echo and a Myoview stress test although the results aren't available  to me. Given her risk factor profile and the progressive nature of her symptoms as well as the fact that Dr. Chase Mcdaniel does not believe that her shortness of breath his explainable by her lung disease I would repeat a 2-D echo and a pharmacologic Myoview stress test (dobutamine). I will see her back after that for further evaluation      Joan Harp MD [facility], [facility] 10/25/2013 3:31 PM

## 2013-10-25 NOTE — Patient Instructions (Signed)
Your physician has requested that you have a dobutamine myoview. For furth information please visit [email or web address]. Please follow instruction sheet, as given.  Your physician has requested that you have an echocardiogram. Echocardiography is a painless test that uses sound waves to create images of your heart. It provides your doctor with information about the size and shape of your heart and how well your heart's chambers and valves are working. This procedure takes approximately one hour. There are no restrictions for this procedure.  Your physician recommends that you schedule a follow-up appointment after your tests have been completed.

## 2013-10-25 NOTE — Progress Notes (Signed)
Today, [name] exercised at [facility]. [facility]. Service time was from 1030 to 1200.  The patient exercised for more than 31 minutes performing aerobic, strengthening, and stretching exercises. Oxygen saturation, heart rate, blood pressure, rate of perceived exertion, and shortness of breath were all monitored before, during, and after exercise. [name] presented with no problems at today's exercise session.   There was no workload change during today's exercise session.  Pre-exercise vitals:   Weight kg: 136.7   Liters of O2: 2   SpO2: 95   HR: 91   BP: 134/62   CBG: NA  Exercise vitals:   Highest heartrate:  102   Lowest oxygen saturation: 94   Highest blood pressure: 148/80   Liters of 02: 2  Post-exercise vitals:   SpO2: 97   HR: 86   BP: 104/62   Liters of O2: 2 CBG: NA Dr. Brand Males, Medical Director   Dr. Dyann Kief is immediately available during today's Pulmonary Rehab session for [name] on 10/25/2013 at 1030 class time.

## 2013-10-27 ENCOUNTER — Telehealth (HOSPITAL_COMMUNITY): Payer: Self-pay | Admitting: *Deleted

## 2013-10-27 ENCOUNTER — Encounter (HOSPITAL_COMMUNITY)
Admission: RE | Admit: 2013-10-27 | Discharge: 2013-10-27 | Disposition: A | Discharge status: Home / Self Care | Payer: Medicare Other | Source: Ambulatory Visit | Attending: Internal Medicine | Admitting: Internal Medicine

## 2013-10-27 NOTE — Progress Notes (Signed)
Today, [name] exercised at [facility]. [facility]. Service time was from 1030 to 1230.  The patient exercised for more than 31 minutes performing aerobic, strengthening, and stretching exercises. Oxygen saturation, heart rate, blood pressure, rate of perceived exertion, and shortness of breath were all monitored before, during, and after exercise. [name] presented with no problems at today's exercise session. [name] attended the education session on advanced directives.  There was no workload change during today's exercise session.  Pre-exercise vitals:   Weight kg: 136.2   Liters of O2: 2L   SpO2: 96   HR: 91   BP: 118/72   CBG: na  Exercise vitals:   Highest heartrate:  97   Lowest oxygen saturation: 96   Highest blood pressure: 110/72   Liters of 02: 2L  Post-exercise vitals:   SpO2: 99   HR: 84   BP: 124/80   Liters of O2: 2L   CBG: na  Dr. Brand Males, Medical Director Dr. Aileen Fass is immediately available during today's Pulmonary Rehab session for [name] on 10/27/2013 at 1030 class time.

## 2013-10-28 ENCOUNTER — Ambulatory Visit: Payer: Medicare Other | Admitting: Cardiovascular Disease

## 2013-10-28 ENCOUNTER — Encounter (HOSPITAL_COMMUNITY): Payer: Medicare Other

## 2013-11-01 ENCOUNTER — Encounter (HOSPITAL_COMMUNITY)
Admission: RE | Admit: 2013-11-01 | Discharge: 2013-11-01 | Disposition: A | Discharge status: Home / Self Care | Payer: Medicare Other | Source: Ambulatory Visit | Attending: Internal Medicine | Admitting: Internal Medicine

## 2013-11-01 DIAGNOSIS — M069 Rheumatoid arthritis, unspecified: Secondary | ICD-10-CM | POA: Diagnosis not present

## 2013-11-01 DIAGNOSIS — M109 Gout, unspecified: Secondary | ICD-10-CM | POA: Diagnosis not present

## 2013-11-01 DIAGNOSIS — M1710 Unilateral primary osteoarthritis, unspecified knee: Secondary | ICD-10-CM | POA: Diagnosis not present

## 2013-11-01 DIAGNOSIS — Z79899 Other long term (current) drug therapy: Secondary | ICD-10-CM | POA: Diagnosis not present

## 2013-11-01 NOTE — Progress Notes (Signed)
Today, [name] exercised at [facility]. [facility]. Service time was from 1030 to 1215.  The patient exercised for more than 31 minutes performing aerobic, strengthening, and stretching exercises. Oxygen saturation, heart rate, blood pressure, rate of perceived exertion, and shortness of breath were all monitored before, during, and after exercise. [name] presented with no problems at today's exercise session.   There was no workload change during today's exercise session.  Pre-exercise vitals:   Weight kg: 136   Liters of O2: 2L   SpO2: 98   HR: 91   BP: 128/68   CBG: na  Exercise vitals:   Highest heartrate:  98   Lowest oxygen saturation: 95   Highest blood pressure: 130/80   Liters of 02: 2L  Post-exercise vitals:   SpO2: 96   HR: 93   BP: 122/70   Liters of O2: 2L   CBG: na Dr. Brand Males, Medical Director Dr. Aileen Fass is immediately available during today's Pulmonary Rehab session for [name] on 11/01/2013 at 1030 class time.

## 2013-11-02 ENCOUNTER — Ambulatory Visit (HOSPITAL_COMMUNITY)
Admission: RE | Admit: 2013-11-02 | Discharge: 2013-11-02 | Disposition: A | Discharge status: Home / Self Care | Payer: Medicare Other | Source: Ambulatory Visit | Attending: Cardiology | Admitting: Cardiology

## 2013-11-02 ENCOUNTER — Encounter: Payer: Self-pay | Admitting: *Deleted

## 2013-11-02 DIAGNOSIS — I519 Heart disease, unspecified: Secondary | ICD-10-CM | POA: Diagnosis not present

## 2013-11-02 DIAGNOSIS — R0600 Dyspnea, unspecified: Secondary | ICD-10-CM

## 2013-11-02 DIAGNOSIS — R0609 Other forms of dyspnea: Secondary | ICD-10-CM | POA: Diagnosis not present

## 2013-11-02 DIAGNOSIS — R079 Chest pain, unspecified: Secondary | ICD-10-CM | POA: Diagnosis not present

## 2013-11-02 DIAGNOSIS — I10 Essential (primary) hypertension: Secondary | ICD-10-CM

## 2013-11-02 DIAGNOSIS — R0989 Other specified symptoms and signs involving the circulatory and respiratory systems: Secondary | ICD-10-CM | POA: Insufficient documentation

## 2013-11-02 DIAGNOSIS — Z79899 Other long term (current) drug therapy: Secondary | ICD-10-CM | POA: Diagnosis not present

## 2013-11-02 DIAGNOSIS — E782 Mixed hyperlipidemia: Secondary | ICD-10-CM | POA: Diagnosis not present

## 2013-11-02 DIAGNOSIS — M2550 Pain in unspecified joint: Secondary | ICD-10-CM | POA: Diagnosis not present

## 2013-11-02 NOTE — Progress Notes (Signed)
2D Echocardiogram Complete.  11/02/2013   [name], [place]

## 2013-11-03 ENCOUNTER — Encounter (HOSPITAL_COMMUNITY)
Admission: RE | Admit: 2013-11-03 | Discharge: 2013-11-03 | Disposition: A | Discharge status: Home / Self Care | Payer: Medicare Other | Source: Ambulatory Visit | Attending: Internal Medicine | Admitting: Internal Medicine

## 2013-11-03 NOTE — Progress Notes (Signed)
Today, [name] exercised at [facility]. [facility]. Service time was from 10:30am to 12:30pm.  The patient exercised for more than 31 minutes performing aerobic, strengthening, and stretching exercises. Oxygen saturation, heart rate, blood pressure, rate of perceived exertion, and shortness of breath were all monitored before, during, and after exercise. [name] presented with no problems at today's exercise session. The patient attended Pulmonary Education Class today on the topic of Medications with [facility].    There was no workload change during today's exercise session.  Pre-exercise vitals:   Weight kg: 135.8   Liters of O2: 2   SpO2: 96   HR: 89   BP: 100/60   CBG: na  Exercise vitals:   Highest heartrate:  104   Lowest oxygen saturation: 96   Highest blood pressure: 138/80   Liters of 02: 2  Post-exercise vitals:   SpO2: 96   HR: 89   BP: 114/60   Liters of O2: 2   CBG: na  Dr. Brand Males, Medical Director Dr. Dyann Kief is immediately available during today's Pulmonary Rehab session for [name] on 11/03/13 at 10:30am class time.

## 2013-11-04 ENCOUNTER — Ambulatory Visit (HOSPITAL_COMMUNITY)
Admission: RE | Admit: 2013-11-04 | Discharge: 2013-11-04 | Disposition: A | Discharge status: Home / Self Care | Payer: Medicare Other | Source: Ambulatory Visit | Attending: Cardiovascular Disease | Admitting: Cardiovascular Disease

## 2013-11-04 VITALS — Ht 64.0 in | Wt 300.0 lb

## 2013-11-04 DIAGNOSIS — R0989 Other specified symptoms and signs involving the circulatory and respiratory systems: Secondary | ICD-10-CM | POA: Insufficient documentation

## 2013-11-04 DIAGNOSIS — J45909 Unspecified asthma, uncomplicated: Secondary | ICD-10-CM | POA: Insufficient documentation

## 2013-11-04 DIAGNOSIS — Z8249 Family history of ischemic heart disease and other diseases of the circulatory system: Secondary | ICD-10-CM | POA: Diagnosis not present

## 2013-11-04 DIAGNOSIS — I779 Disorder of arteries and arterioles, unspecified: Secondary | ICD-10-CM | POA: Diagnosis not present

## 2013-11-04 DIAGNOSIS — I10 Essential (primary) hypertension: Secondary | ICD-10-CM | POA: Diagnosis not present

## 2013-11-04 DIAGNOSIS — E669 Obesity, unspecified: Secondary | ICD-10-CM | POA: Diagnosis not present

## 2013-11-04 DIAGNOSIS — E119 Type 2 diabetes mellitus without complications: Secondary | ICD-10-CM | POA: Diagnosis not present

## 2013-11-04 DIAGNOSIS — R0600 Dyspnea, unspecified: Secondary | ICD-10-CM

## 2013-11-04 DIAGNOSIS — R0609 Other forms of dyspnea: Secondary | ICD-10-CM

## 2013-11-04 DIAGNOSIS — R0602 Shortness of breath: Secondary | ICD-10-CM | POA: Diagnosis not present

## 2013-11-04 DIAGNOSIS — R079 Chest pain, unspecified: Secondary | ICD-10-CM | POA: Diagnosis not present

## 2013-11-04 MED ORDER — DOBUTAMINE INFUSION FOR EP/ECHO/NUC (1000 MCG/ML)
40.0000 ug/kg/min | Freq: Once | INTRAVENOUS | Status: AC
  Administered 2013-11-04: 40 ug/kg/min via INTRAVENOUS

## 2013-11-04 MED ORDER — TECHNETIUM TC 99M SESTAMIBI GENERIC - CARDIOLITE
10.3000 | Freq: Once | INTRAVENOUS | Status: AC | PRN
  Administered 2013-11-04: 10 via INTRAVENOUS

## 2013-11-04 MED ORDER — TECHNETIUM TC 99M SESTAMIBI GENERIC - CARDIOLITE
30.9000 | Freq: Once | INTRAVENOUS | Status: AC | PRN
  Administered 2013-11-04: 30.9 via INTRAVENOUS

## 2013-11-04 NOTE — Procedures (Addendum)
[place] [address] [place] [address] [phone]  Cardiology Nuclear Med Study  [name] is a 73 y.o. female     MRN : [id number]     DOB: [birth year]  Procedure Date: 11/04/2013  Nuclear Med Background Indication for Stress Test:  Evaluation for Ischemia History:  Asthma and Interstitial lung disease;acute bronchitis;chronic cough;Last NUC MPI on 01/30/2012-possible ischemia;EF=84% Cardiac Risk Factors: Carotid Disease, Family History - CAD, Hypertension, Lipids, NIDDM, Obesity and Right carotid artery oclusion  Symptoms:  Chest Pain, DOE, Fatigue and SOB   Nuclear Pre-Procedure Caffeine/Decaff Intake:  1:00am NPO After: 11am   IV Site: R Hand  IV 0.9% NS with Angio Cath:  22g  Chest Size (in):  n/a IV Started by: Rolene Course, RN  Height: _0  (1.626 m)  Cup Size: DD  BMI:  Body mass index is 51.47 kg/(m^2). Weight:  300 lb (136.079 kg)   Tech Comments:  n/a    Nuclear Med Study 1 or 2 day study: 1 day  Stress Test Type:  Dobutamine  Order Authorizing Provider:  Quay Burow, MD   Resting Radionuclide: Technetium 74mSestamibi  Resting Radionuclide Dose: 10.3 mCi   Stress Radionuclide:  Technetium 975mestamibi  Stress Radionuclide Dose: 30.9 mCi           Stress Protocol Rest HR:96 Stress HR: 134  Rest BP: 115/87 Stress BP: 134/47  Exercise Time (min): n/a METS: n/a          Dose of Adenosine (mg):  n/a Dose of Lexiscan: n/a mg  Dose of Atropine (mg): n/a Dose of Dobutamine: 40 mcg/kg/min (at max HR)  Stress Test Technologist: GwMellody MemosCCT Nuclear Technologist: PaImagene RichesCNMT   Rest Procedure:  Myocardial perfusion imaging was performed at rest 45 minutes following the intravenous administration of Technetium 9976mstamibi. Stress Procedure:  The patient received IV dobutamine and no IV atropine.  There were no significant changes with infusion.  Technetium 83m84mtamibi was  injected IV at peak heart rate and quantitative spect images were obtained after a 45 minute delay.  Transient Ischemic Dilatation (Normal <1.22):  1.10  QGS EDV:  62 ml QGS ESV:  15 ml LV Ejection Fraction: 75%      Rest ECG: NSR - Normal EKG  Stress ECG: No significant change from baseline ECG  QPS Raw Data Images:  Normal; no motion artifact; normal heart/lung ratio. Stress Images:  Mild inferior attenuation with otherwise normal homogeneous uptake in all areas of the myocardium. Rest Images:  Normal homogeneous uptake in all areas of the myocardium. Subtraction (SDS):  No significant scar or ischemia.  Impression Exercise Capacity:  Dobutamine study with no exercise. BP Response:  Normal blood pressure response. Clinical Symptoms:  No significant symptoms noted. ECG Impression:  No significant ST segment change suggestive of ischemia. Comparison with Prior Nuclear Study: No images to compare  Overall Impression:  Low risk stress nuclear study demonstrating mild inferior attenuation without ischemia.  LV Wall Motion:  NL LV Function,EF 75%; NL Wall Motion   KELLY,THOMAS A, MD  11/04/2013 4:52 PM

## 2013-11-08 ENCOUNTER — Encounter (HOSPITAL_COMMUNITY)
Admission: RE | Admit: 2013-11-08 | Discharge: 2013-11-08 | Disposition: A | Discharge status: Home / Self Care | Payer: Medicare Other | Source: Ambulatory Visit | Attending: Rheumatology | Admitting: Rheumatology

## 2013-11-08 ENCOUNTER — Encounter: Payer: Self-pay | Admitting: *Deleted

## 2013-11-08 ENCOUNTER — Encounter (HOSPITAL_COMMUNITY)
Admission: RE | Admit: 2013-11-08 | Discharge: 2013-11-08 | Disposition: A | Discharge status: Home / Self Care | Payer: Medicare Other | Source: Ambulatory Visit | Attending: Internal Medicine | Admitting: Internal Medicine

## 2013-11-08 MED ORDER — DIPHENHYDRAMINE HCL 25 MG PO CAPS: 25.0000 mg | ORAL_CAPSULE | ORAL | Status: DC

## 2013-11-08 MED ORDER — SODIUM CHLORIDE 0.9 % IV SOLN
INTRAVENOUS | Status: DC
  Administered 2013-11-08: 13:00:00 via INTRAVENOUS

## 2013-11-08 MED ORDER — ACETAMINOPHEN 325 MG PO TABS: 650.0000 mg | ORAL_TABLET | ORAL | Status: DC

## 2013-11-08 MED ORDER — TOCILIZUMAB 400 MG/20ML IV SOLN
480.0000 mg | INTRAVENOUS | Status: DC
  Administered 2013-11-08: 480 mg via INTRAVENOUS
  Filled 2013-11-08: qty 24

## 2013-11-08 NOTE — Progress Notes (Signed)
Today, [name] exercised at [facility]. [facility]. Service time was from 10:30 to 12:30.  The patient exercised for more than 31 minutes performing aerobic, strengthening, and stretching exercises. Oxygen saturation, heart rate, blood pressure, rate of perceived exertion, and shortness of breath were all monitored before, during, and after exercise. [name] presented with no problems at today's exercise session.   There was no workload change during today's exercise session.  Pre-exercise vitals:   Weight kg: 136.8   Liters of O2: 2   SpO2: 100   HR: 92   BP: 106/66   CBG: na  Exercise vitals:   Highest heartrate:  102   Lowest oxygen saturation: 95   Highest blood pressure: 122/70   Liters of 02: 2  Post-exercise vitals:   SpO2: 96   HR: 90   BP: 106/64   Liters of O2: 2   CBG: na  Dr. Brand Males, Medical Director Dr. Dyann Kief is immediately available during today's Pulmonary Rehab session for Averlee E Ebright on 11/08/13 at 10:30am class time.

## 2013-11-09 ENCOUNTER — Telehealth: Payer: Self-pay | Admitting: Internal Medicine

## 2013-11-09 DIAGNOSIS — M899 Disorder of bone, unspecified: Secondary | ICD-10-CM | POA: Diagnosis not present

## 2013-11-09 DIAGNOSIS — E1129 Type 2 diabetes mellitus with other diabetic kidney complication: Secondary | ICD-10-CM | POA: Diagnosis not present

## 2013-11-09 DIAGNOSIS — N183 Chronic kidney disease, stage 3 unspecified: Secondary | ICD-10-CM | POA: Diagnosis not present

## 2013-11-09 DIAGNOSIS — E669 Obesity, unspecified: Secondary | ICD-10-CM | POA: Diagnosis not present

## 2013-11-09 DIAGNOSIS — M2550 Pain in unspecified joint: Secondary | ICD-10-CM | POA: Diagnosis not present

## 2013-11-09 DIAGNOSIS — Z79899 Other long term (current) drug therapy: Secondary | ICD-10-CM | POA: Diagnosis not present

## 2013-11-09 DIAGNOSIS — R0602 Shortness of breath: Secondary | ICD-10-CM | POA: Diagnosis not present

## 2013-11-09 DIAGNOSIS — I129 Hypertensive chronic kidney disease with stage 1 through stage 4 chronic kidney disease, or unspecified chronic kidney disease: Secondary | ICD-10-CM | POA: Diagnosis not present

## 2013-11-09 DIAGNOSIS — R0989 Other specified symptoms and signs involving the circulatory and respiratory systems: Secondary | ICD-10-CM | POA: Diagnosis not present

## 2013-11-09 DIAGNOSIS — R0609 Other forms of dyspnea: Secondary | ICD-10-CM | POA: Diagnosis not present

## 2013-11-09 DIAGNOSIS — Z6841 Body Mass Index (BMI) 40.0 and over, adult: Secondary | ICD-10-CM | POA: Diagnosis not present

## 2013-11-09 DIAGNOSIS — N058 Unspecified nephritic syndrome with other morphologic changes: Secondary | ICD-10-CM | POA: Diagnosis not present

## 2013-11-09 NOTE — Telephone Encounter (Signed)
LMTCB

## 2013-11-10 ENCOUNTER — Encounter (HOSPITAL_COMMUNITY): Payer: Medicare Other

## 2013-11-10 NOTE — Telephone Encounter (Signed)
LMTCB

## 2013-11-11 NOTE — Telephone Encounter (Signed)
I caleld # left to call pt. Was advised I had the wrong #. I confirmed the phone number with gentleman on the phone. Called pt home # in pt chart and  lMTCB X1

## 2013-11-14 NOTE — Telephone Encounter (Signed)
Called and spoke to pt. Informed her that MR has not yet reviewed the test but once he dose we should be giving her a call. Pt verbalized understanding and denied any further questions or concerns at this time.

## 2013-11-15 ENCOUNTER — Encounter (HOSPITAL_COMMUNITY): Payer: Medicare Other

## 2013-11-17 ENCOUNTER — Encounter (HOSPITAL_COMMUNITY): Payer: Medicare Other

## 2013-11-22 ENCOUNTER — Encounter: Payer: Self-pay | Admitting: Cardiovascular Disease

## 2013-11-22 ENCOUNTER — Encounter (HOSPITAL_COMMUNITY): Payer: Medicare Other

## 2013-11-22 ENCOUNTER — Ambulatory Visit (INDEPENDENT_AMBULATORY_CARE_PROVIDER_SITE_OTHER): Payer: Medicare Other | Admitting: Cardiovascular Disease

## 2013-11-22 VITALS — BP 130/74 | HR 96 | Ht 64.5 in | Wt 298.4 lb

## 2013-11-22 DIAGNOSIS — R079 Chest pain, unspecified: Secondary | ICD-10-CM

## 2013-11-22 NOTE — Patient Instructions (Signed)
Your physician recommends that you schedule a follow-up appointment in: [place] with an extender, and ONE YEAR with Dr. Gwenlyn Found.

## 2013-11-22 NOTE — Assessment & Plan Note (Signed)
[name] had a 2-D echo which was normal and a Myoview stress test that showed diaphragmatic attenuation without ischemia. I reassured her that the likelihood that her symptoms are cardiovascular in nature are small but not 0 and I suggested [name] continue her pulmonary evaluation for shortness of breath.

## 2013-11-22 NOTE — Progress Notes (Signed)
Ms. Herter returns today for followup of her 2-D echo and Myoview stress test both of which were essentially normal. I reassured her that the likelihood that her symptoms are cardiac in nature are small and suggested [name] continue his pulmonary evaluation of her shortness of breath.  Lorretta Harp, M.D., [place], [facility], [place] [address] [place], [place]  [address]  [phone] 11/22/2013 10:04 AM

## 2013-11-24 ENCOUNTER — Encounter (HOSPITAL_COMMUNITY): Payer: Medicare Other

## 2013-11-25 ENCOUNTER — Encounter: Payer: Medicare Other | Admitting: Physical Medicine & Rehabilitation

## 2013-11-28 ENCOUNTER — Ambulatory Visit: Payer: Medicare Other | Admitting: Internal Medicine

## 2013-11-29 ENCOUNTER — Encounter (HOSPITAL_COMMUNITY)
Admission: RE | Admit: 2013-11-29 | Discharge: 2013-11-29 | Disposition: A | Discharge status: Home / Self Care | Payer: Medicare Other | Source: Ambulatory Visit | Attending: Internal Medicine | Admitting: Internal Medicine

## 2013-11-29 DIAGNOSIS — J8410 Pulmonary fibrosis, unspecified: Secondary | ICD-10-CM | POA: Insufficient documentation

## 2013-11-29 DIAGNOSIS — M545 Low back pain, unspecified: Secondary | ICD-10-CM | POA: Diagnosis not present

## 2013-11-29 DIAGNOSIS — M76899 Other specified enthesopathies of unspecified lower limb, excluding foot: Secondary | ICD-10-CM | POA: Insufficient documentation

## 2013-11-29 DIAGNOSIS — Z5189 Encounter for other specified aftercare: Secondary | ICD-10-CM | POA: Diagnosis not present

## 2013-11-29 DIAGNOSIS — R262 Difficulty in walking, not elsewhere classified: Secondary | ICD-10-CM | POA: Diagnosis not present

## 2013-11-29 DIAGNOSIS — I10 Essential (primary) hypertension: Secondary | ICD-10-CM | POA: Diagnosis not present

## 2013-11-29 DIAGNOSIS — R079 Chest pain, unspecified: Secondary | ICD-10-CM | POA: Insufficient documentation

## 2013-11-29 DIAGNOSIS — E669 Obesity, unspecified: Secondary | ICD-10-CM | POA: Diagnosis not present

## 2013-11-29 NOTE — Progress Notes (Signed)
Today, Antanisha exercised at [facility]. [facility]. Service time was from 1030 to 1230.  The patient exercised for more than 31 minutes performing aerobic, strengthening, and stretching exercises. Oxygen saturation, heart rate, blood pressure, rate of perceived exertion, and shortness of breath were all monitored before, during, and after exercise. Lashuna presented with no problems at today's exercise session.   There was no workload change during today's exercise session.  Pre-exercise vitals:   Weight kg: 135.3   Liters of O2: 2   SpO2: 96   HR: 99   BP: 110/60   CBG: NA  Exercise vitals:   Highest heartrate:  105   Lowest oxygen saturation: 96   Highest blood pressure: 106/60   Liters of 02: 2  Post-exercise vitals:   SpO2: 97   HR: 87   BP: 126/74   Liters of O2: 2   CBG: NA Dr. Brand Males, Medical Director Dr. Aileen Fass is immediately available during today's Pulmonary Rehab session for Maurianna E Kallal on 11/29/2013 at 1030 class time.

## 2013-11-30 DIAGNOSIS — R7309 Other abnormal glucose: Secondary | ICD-10-CM | POA: Diagnosis not present

## 2013-11-30 DIAGNOSIS — Z0000 Encounter for general adult medical examination without abnormal findings: Secondary | ICD-10-CM | POA: Diagnosis not present

## 2013-11-30 DIAGNOSIS — E8881 Metabolic syndrome: Secondary | ICD-10-CM | POA: Diagnosis not present

## 2013-11-30 DIAGNOSIS — N390 Urinary tract infection, site not specified: Secondary | ICD-10-CM | POA: Diagnosis not present

## 2013-11-30 DIAGNOSIS — I129 Hypertensive chronic kidney disease with stage 1 through stage 4 chronic kidney disease, or unspecified chronic kidney disease: Secondary | ICD-10-CM | POA: Diagnosis not present

## 2013-11-30 DIAGNOSIS — E039 Hypothyroidism, unspecified: Secondary | ICD-10-CM | POA: Diagnosis not present

## 2013-11-30 DIAGNOSIS — R0602 Shortness of breath: Secondary | ICD-10-CM | POA: Diagnosis not present

## 2013-11-30 DIAGNOSIS — M069 Rheumatoid arthritis, unspecified: Secondary | ICD-10-CM | POA: Diagnosis not present

## 2013-11-30 DIAGNOSIS — N183 Chronic kidney disease, stage 3 unspecified: Secondary | ICD-10-CM | POA: Diagnosis not present

## 2013-12-01 ENCOUNTER — Other Ambulatory Visit: Payer: Self-pay

## 2013-12-01 ENCOUNTER — Encounter (HOSPITAL_COMMUNITY)
Admission: RE | Admit: 2013-12-01 | Discharge: 2013-12-01 | Disposition: A | Discharge status: Home / Self Care | Payer: Medicare Other | Source: Ambulatory Visit | Attending: Internal Medicine | Admitting: Internal Medicine

## 2013-12-01 DIAGNOSIS — Z5189 Encounter for other specified aftercare: Secondary | ICD-10-CM | POA: Diagnosis not present

## 2013-12-01 DIAGNOSIS — Z1231 Encounter for screening mammogram for malignant neoplasm of breast: Secondary | ICD-10-CM

## 2013-12-01 NOTE — Progress Notes (Signed)
Today, [name] exercised at [facility]. [facility]. Service time was from 1030 to 1230.  The patient exercised for more than 31 minutes performing aerobic, strengthening, and stretching exercises. Oxygen saturation, heart rate, blood pressure, rate of perceived exertion, and shortness of breath were all monitored before, during, and after exercise. [name] presented with no problems at today's exercise session. [name] also attended an education session on nutrition for the pulmonary patient.  There was no workload change during today's exercise session.  Pre-exercise vitals:   Weight kg: 135.4   Liters of O2: 2L   SpO2: 96   HR: 93   BP: 122/64   CBG: na  Exercise vitals:   Highest heartrate:  94   Lowest oxygen saturation: 96   Highest blood pressure: 124/64   Liters of 02: 2L  Post-exercise vitals:   SpO2: 95   HR: 87   BP: 106/58   Liters of O2: 2L   CBG: na  Dr. Brand Males, Medical Director Dr. Aileen Fass is immediately available during today's Pulmonary Rehab session for [name] on 12/01/2013 at 1030 class time.

## 2013-12-05 DIAGNOSIS — Z79899 Other long term (current) drug therapy: Secondary | ICD-10-CM | POA: Diagnosis not present

## 2013-12-06 ENCOUNTER — Inpatient Hospital Stay (HOSPITAL_COMMUNITY): Admission: RE | Admit: 2013-12-06 | Payer: Medicare Other | Source: Ambulatory Visit

## 2013-12-07 ENCOUNTER — Encounter: Payer: Self-pay | Admitting: Internal Medicine

## 2013-12-07 DIAGNOSIS — M109 Gout, unspecified: Secondary | ICD-10-CM | POA: Diagnosis not present

## 2013-12-07 DIAGNOSIS — M1710 Unilateral primary osteoarthritis, unspecified knee: Secondary | ICD-10-CM | POA: Diagnosis not present

## 2013-12-07 DIAGNOSIS — M069 Rheumatoid arthritis, unspecified: Secondary | ICD-10-CM | POA: Diagnosis not present

## 2013-12-07 DIAGNOSIS — Z79899 Other long term (current) drug therapy: Secondary | ICD-10-CM | POA: Diagnosis not present

## 2013-12-12 DIAGNOSIS — E1129 Type 2 diabetes mellitus with other diabetic kidney complication: Secondary | ICD-10-CM | POA: Diagnosis not present

## 2013-12-12 DIAGNOSIS — Z111 Encounter for screening for respiratory tuberculosis: Secondary | ICD-10-CM | POA: Diagnosis not present

## 2013-12-12 DIAGNOSIS — I129 Hypertensive chronic kidney disease with stage 1 through stage 4 chronic kidney disease, or unspecified chronic kidney disease: Secondary | ICD-10-CM | POA: Diagnosis not present

## 2013-12-12 DIAGNOSIS — N183 Chronic kidney disease, stage 3 unspecified: Secondary | ICD-10-CM | POA: Diagnosis not present

## 2013-12-14 ENCOUNTER — Ambulatory Visit: Payer: Medicare Other

## 2013-12-14 ENCOUNTER — Ambulatory Visit
Admission: RE | Admit: 2013-12-14 | Discharge: 2013-12-14 | Disposition: A | Discharge status: Home / Self Care | Payer: Medicare Other | Source: Ambulatory Visit

## 2013-12-14 DIAGNOSIS — Z1231 Encounter for screening mammogram for malignant neoplasm of breast: Secondary | ICD-10-CM

## 2013-12-23 ENCOUNTER — Telehealth (HOSPITAL_COMMUNITY): Payer: Self-pay

## 2013-12-23 NOTE — Telephone Encounter (Signed)
Patient has had extended absence from Pulmonary Rehab due to pulled muscle.  Patient states she is ready to return and will be coming back on 12/27/13/.

## 2013-12-27 ENCOUNTER — Encounter (HOSPITAL_COMMUNITY)
Admission: RE | Admit: 2013-12-27 | Discharge: 2013-12-27 | Disposition: A | Discharge status: Home / Self Care | Payer: Medicare Other | Source: Ambulatory Visit | Attending: Internal Medicine | Admitting: Internal Medicine

## 2013-12-27 DIAGNOSIS — E669 Obesity, unspecified: Secondary | ICD-10-CM | POA: Insufficient documentation

## 2013-12-27 DIAGNOSIS — R262 Difficulty in walking, not elsewhere classified: Secondary | ICD-10-CM | POA: Insufficient documentation

## 2013-12-27 DIAGNOSIS — I10 Essential (primary) hypertension: Secondary | ICD-10-CM | POA: Diagnosis not present

## 2013-12-27 DIAGNOSIS — M76899 Other specified enthesopathies of unspecified lower limb, excluding foot: Secondary | ICD-10-CM | POA: Diagnosis not present

## 2013-12-27 DIAGNOSIS — M545 Low back pain, unspecified: Secondary | ICD-10-CM | POA: Diagnosis not present

## 2013-12-27 DIAGNOSIS — J8410 Pulmonary fibrosis, unspecified: Secondary | ICD-10-CM | POA: Insufficient documentation

## 2013-12-27 DIAGNOSIS — Z5189 Encounter for other specified aftercare: Secondary | ICD-10-CM | POA: Insufficient documentation

## 2013-12-27 DIAGNOSIS — R079 Chest pain, unspecified: Secondary | ICD-10-CM | POA: Diagnosis not present

## 2013-12-27 NOTE — Progress Notes (Signed)
Today, [name] exercised at [facility]. [facility]. Service time was from 10:30am to 12:15pm.  The patient exercised for more than 31 minutes performing aerobic, strengthening, and stretching exercises. Oxygen saturation, heart rate, blood pressure, rate of perceived exertion, and shortness of breath were all monitored before, during, and after exercise. [name] presented with no problems at today's exercise session.   There was an increase in workload change during today's exercise session.  Pre-exercise vitals:   Weight kg: 131.5   Liters of O2: 2l   SpO2: 97   HR: 86   BP: 128/78   CBG: na  Exercise vitals:   Highest heartrate:  99   Lowest oxygen saturation: 97   Highest blood pressure: 134/66   Liters of 02: 2  Post-exercise vitals:   SpO2: 94   HR: 80   BP: 100/58   Liters of O2: 2   CBG: na  Dr. Brand Males, Medical Director Dr. Frederic Jericho is immediately available during today's Pulmonary Rehab session for [name] on 12/27/13 at 10:30am class time.

## 2013-12-28 ENCOUNTER — Encounter (HOSPITAL_COMMUNITY)
Admission: RE | Admit: 2013-12-28 | Discharge: 2013-12-28 | Disposition: A | Discharge status: Home / Self Care | Payer: Medicare Other | Source: Ambulatory Visit | Attending: Rheumatology | Admitting: Rheumatology

## 2013-12-28 DIAGNOSIS — Z5189 Encounter for other specified aftercare: Secondary | ICD-10-CM | POA: Diagnosis not present

## 2013-12-28 DIAGNOSIS — E669 Obesity, unspecified: Secondary | ICD-10-CM | POA: Diagnosis not present

## 2013-12-28 DIAGNOSIS — R079 Chest pain, unspecified: Secondary | ICD-10-CM | POA: Diagnosis not present

## 2013-12-28 DIAGNOSIS — M76899 Other specified enthesopathies of unspecified lower limb, excluding foot: Secondary | ICD-10-CM | POA: Diagnosis not present

## 2013-12-28 DIAGNOSIS — J8410 Pulmonary fibrosis, unspecified: Secondary | ICD-10-CM | POA: Diagnosis not present

## 2013-12-28 DIAGNOSIS — I10 Essential (primary) hypertension: Secondary | ICD-10-CM | POA: Diagnosis not present

## 2013-12-28 MED ORDER — SODIUM CHLORIDE 0.9 % IV SOLN
INTRAVENOUS | Status: DC
  Administered 2013-12-28: 11:00:00 via INTRAVENOUS

## 2013-12-28 MED ORDER — TOCILIZUMAB 400 MG/20ML IV SOLN
480.0000 mg | INTRAVENOUS | Status: DC
  Administered 2013-12-28: 480 mg via INTRAVENOUS
  Filled 2013-12-28: qty 24

## 2013-12-28 MED ORDER — ACETAMINOPHEN 325 MG PO TABS: 650.0000 mg | ORAL_TABLET | ORAL | Status: DC

## 2013-12-28 MED ORDER — DIPHENHYDRAMINE HCL 25 MG PO CAPS: 25.0000 mg | ORAL_CAPSULE | ORAL | Status: DC

## 2013-12-29 ENCOUNTER — Encounter (HOSPITAL_COMMUNITY)
Admission: RE | Admit: 2013-12-29 | Discharge: 2013-12-29 | Disposition: A | Discharge status: Home / Self Care | Payer: Medicare Other | Source: Ambulatory Visit | Attending: Internal Medicine | Admitting: Internal Medicine

## 2013-12-29 DIAGNOSIS — I10 Essential (primary) hypertension: Secondary | ICD-10-CM | POA: Diagnosis not present

## 2013-12-29 DIAGNOSIS — E669 Obesity, unspecified: Secondary | ICD-10-CM | POA: Diagnosis not present

## 2013-12-29 DIAGNOSIS — J8410 Pulmonary fibrosis, unspecified: Secondary | ICD-10-CM | POA: Diagnosis not present

## 2013-12-29 DIAGNOSIS — M76899 Other specified enthesopathies of unspecified lower limb, excluding foot: Secondary | ICD-10-CM | POA: Diagnosis not present

## 2013-12-29 DIAGNOSIS — R079 Chest pain, unspecified: Secondary | ICD-10-CM | POA: Diagnosis not present

## 2013-12-29 DIAGNOSIS — Z5189 Encounter for other specified aftercare: Secondary | ICD-10-CM | POA: Diagnosis not present

## 2013-12-29 NOTE — Progress Notes (Signed)
Today, Roniya exercised at [facility]. [facility]. Service time was from 10:30am to 12:30pm.  The patient exercised for more than 31 minutes performing aerobic, strengthening, and stretching exercises. Oxygen saturation, heart rate, blood pressure, rate of perceived exertion, and shortness of breath were all monitored before, during, and after exercise. Dalesha presented with no problems at today's exercise session. The patient attended Pulmonary Medications education class today with [facility].  There was no workload change during today's exercise session.  Pre-exercise vitals:   Weight kg: 135.0   Liters of O2: 2   SpO2: 98   HR: 82   BP: 118/62   CBG: na  Exercise vitals:   Highest heartrate:  99   Lowest oxygen saturation: 98   Highest blood pressure: 120/74   Liters of 02: 2  Post-exercise vitals:   SpO2: 95   HR: 84   BP: 122/70   Liters of O2: 2   CBG: na  Dr. Brand Males, Medical Director Dr. [place] Pink is immediately available during today's Pulmonary Rehab session for Dajanae E Lemanski on 12/29/13 at 10:30am class time.

## 2014-01-03 ENCOUNTER — Encounter (HOSPITAL_COMMUNITY): Payer: Medicare Other

## 2014-01-04 DIAGNOSIS — D696 Thrombocytopenia, unspecified: Secondary | ICD-10-CM | POA: Diagnosis not present

## 2014-01-04 DIAGNOSIS — E039 Hypothyroidism, unspecified: Secondary | ICD-10-CM | POA: Diagnosis not present

## 2014-01-05 ENCOUNTER — Encounter (HOSPITAL_COMMUNITY)
Admission: RE | Admit: 2014-01-05 | Discharge: 2014-01-05 | Disposition: A | Discharge status: Home / Self Care | Payer: Medicare Other | Source: Ambulatory Visit | Attending: Internal Medicine | Admitting: Internal Medicine

## 2014-01-05 DIAGNOSIS — M76899 Other specified enthesopathies of unspecified lower limb, excluding foot: Secondary | ICD-10-CM | POA: Diagnosis not present

## 2014-01-05 DIAGNOSIS — I10 Essential (primary) hypertension: Secondary | ICD-10-CM | POA: Diagnosis not present

## 2014-01-05 DIAGNOSIS — R079 Chest pain, unspecified: Secondary | ICD-10-CM | POA: Diagnosis not present

## 2014-01-05 DIAGNOSIS — Z5189 Encounter for other specified aftercare: Secondary | ICD-10-CM | POA: Diagnosis not present

## 2014-01-05 DIAGNOSIS — J8410 Pulmonary fibrosis, unspecified: Secondary | ICD-10-CM | POA: Diagnosis not present

## 2014-01-05 DIAGNOSIS — E669 Obesity, unspecified: Secondary | ICD-10-CM | POA: Diagnosis not present

## 2014-01-05 NOTE — Progress Notes (Signed)
Today, Kristinia exercised at [facility]. [facility]. Service time was from 1030 to 1230.  The patient exercised for more than 31 minutes performing aerobic, strengthening, and stretching exercises. Oxygen saturation, heart rate, blood pressure, rate of perceived exertion, and shortness of breath were all monitored before, during, and after exercise. Daje presented with no problems at today's exercise session. Patient attended the home oxygen class.  There was one workload change during today's exercise session.  Pre-exercise vitals:   Weight kg: 133.5   Liters of O2: 2   SpO2: 100   HR: 83   BP: 100/54   CBG: NA  Exercise vitals:   Highest heartrate:  124   Lowest oxygen saturation: 94   Highest blood pressure: 110/80   Liters of 02: 2  Post-exercise vitals:   SpO2: 97   HR: 86   BP: 118/56   Liters of O2: 2   CBG: NA Dr. Brand Males, Medical Director Dr. Algis Liming is immediately available during today's Pulmonary Rehab session for Helen E Hoak on 01/05/2014 at 1030 class time.

## 2014-01-10 ENCOUNTER — Encounter (HOSPITAL_COMMUNITY)
Admission: RE | Admit: 2014-01-10 | Discharge: 2014-01-10 | Disposition: A | Discharge status: Home / Self Care | Payer: Medicare Other | Source: Ambulatory Visit | Attending: Internal Medicine | Admitting: Internal Medicine

## 2014-01-10 DIAGNOSIS — Z5189 Encounter for other specified aftercare: Secondary | ICD-10-CM | POA: Diagnosis not present

## 2014-01-10 DIAGNOSIS — I10 Essential (primary) hypertension: Secondary | ICD-10-CM | POA: Diagnosis not present

## 2014-01-10 DIAGNOSIS — J8410 Pulmonary fibrosis, unspecified: Secondary | ICD-10-CM | POA: Diagnosis not present

## 2014-01-10 DIAGNOSIS — R079 Chest pain, unspecified: Secondary | ICD-10-CM | POA: Diagnosis not present

## 2014-01-10 DIAGNOSIS — M76899 Other specified enthesopathies of unspecified lower limb, excluding foot: Secondary | ICD-10-CM | POA: Diagnosis not present

## 2014-01-10 DIAGNOSIS — E669 Obesity, unspecified: Secondary | ICD-10-CM | POA: Diagnosis not present

## 2014-01-10 NOTE — Progress Notes (Signed)
Today, [name] exercised at [facility]. [facility]. Service time was from 10:30am to 12:15pm.  The patient exercised for more than 31 minutes performing aerobic, strengthening, and stretching exercises. Oxygen saturation, heart rate, blood pressure, rate of perceived exertion, and shortness of breath were all monitored before, during, and after exercise. [name] presented with no problems at today's exercise session.   There was no workload change during today's exercise session.  Pre-exercise vitals:   Weight kg: 133.8   Liters of O2: 2   SpO2: 96   HR: 93   BP: 100/80   CBG: na  Exercise vitals:   Highest heartrate:  101   Lowest oxygen saturation: 96   Highest blood pressure: 112/60   Liters of 02: 2  Post-exercise vitals:   SpO2: 98   HR: 89   BP: 122/64   Liters of O2: 2   CBG: na  Dr. Brand Males, Medical Director Dr. Algis Liming is immediately available during today's Pulmonary Rehab session for [name] on 01/10/14 at 10:30am class time.

## 2014-01-12 ENCOUNTER — Ambulatory Visit (INDEPENDENT_AMBULATORY_CARE_PROVIDER_SITE_OTHER): Payer: Medicare Other | Admitting: Internal Medicine

## 2014-01-12 ENCOUNTER — Encounter (HOSPITAL_COMMUNITY): Payer: Medicare Other

## 2014-01-12 ENCOUNTER — Encounter: Payer: Self-pay | Admitting: Internal Medicine

## 2014-01-12 VITALS — BP 116/70 | HR 87 | Ht 62.0 in | Wt 290.0 lb

## 2014-01-12 DIAGNOSIS — R0989 Other specified symptoms and signs involving the circulatory and respiratory systems: Secondary | ICD-10-CM

## 2014-01-12 DIAGNOSIS — R0609 Other forms of dyspnea: Secondary | ICD-10-CM | POA: Diagnosis not present

## 2014-01-12 DIAGNOSIS — J849 Interstitial pulmonary disease, unspecified: Secondary | ICD-10-CM

## 2014-01-12 DIAGNOSIS — R0600 Dyspnea, unspecified: Secondary | ICD-10-CM

## 2014-01-12 DIAGNOSIS — J8410 Pulmonary fibrosis, unspecified: Secondary | ICD-10-CM

## 2014-01-12 LAB — PULMONARY FUNCTION TEST
DL/VA % pred: 95 %
DL/VA: 4.33 ml/min/mmHg/L
DLCO unc % pred: 77 %
DLCO unc: 16.69 ml/min/mmHg
FEF 25-75 Post: 2.42 L/s
FEF 25-75 Pre: 3.58 L/s
FEF2575-%Change-Post: -32 %
FEF2575-%Pred-Post: 164 %
FEF2575-%Pred-Pre: 243 %
FEV1-%Change-Post: -6 %
FEV1-%Pred-Post: 84 %
FEV1-%Pred-Pre: 90 %
FEV1-Post: 1.36 L
FEV1-Pre: 1.45 L
FEV1FVC-%Change-Post: 1 %
FEV1FVC-%Pred-Pre: 118 %
FEV6-%Change-Post: -5 %
FEV6-%Pred-Post: 73 %
FEV6-%Pred-Pre: 77 %
FEV6-Post: 1.46 L
FEV6-Pre: 1.54 L
FEV6FVC-%Pred-Post: 104 %
FEV6FVC-%Pred-Pre: 104 %
FVC-%Change-Post: -8 %
FVC-%Pred-Post: 70 %
FVC-%Pred-Pre: 76 %
FVC-Post: 1.46 L
FVC-Pre: 1.59 L
Post FEV1/FVC ratio: 93 %
Post FEV6/FVC ratio: 100 %
Pre FEV1/FVC ratio: 91 %
Pre FEV6/FVC Ratio: 100 %
RV % pred: 62 %
RV: 1.34 L
TLC % pred: 74 %
TLC: 3.55 L

## 2014-01-12 NOTE — Progress Notes (Signed)
Subjective:    Patient ID: [name], female    DOB: [birth year], 73 y.o.   MRN: [id number]  HPI   PCP Maximino Greenland, MD [place] - Dr Estanislado Pandy Cards - Dr Woody Seller at [facility]  #Rheumatoid Arthritis  [facility] used to be on remicade with MTx around 2009. go. Then on Orencia with methotrexate. Last few years MTXaline and finally due to worsening jt disesae MTx stopped and she is on actmera with prednisone, colchicine, Plaquenil along with other pain medications of Neurontin, Cymbalta, morphine and Tylenol    - #Morbid OBesity - BMI 47.4 - nov 2014  - Body mass index is 53.03 kg/(m^2).     #Dyspnea since fall 2014, class 3 - major life limiting symptoms - cPST - could not preform May 2015    #ILD  - - Walk test 185 feet x3 laps on room air today 04/06/2013. She only completed 2 laps and got very dyspneic and fatigued. The heart rate was 111. She desaturated to 88%  - ono 04/11/13 shows lowerst pulse ox 79% and total time </= 89% at 6 min and </= 88% at 3 min. She has very mild desat. Does not qualify for overnight oxygen  Pulmonary function test 04/28/2013 shows FEV1 1.7 L/74%. FEV1 1.46 L/81%. Ratio to 84/109%. Suggestive of restriction. Total lung capacity is 2.9 L/58% and consistent with restriction.  - CT Nov 2014: NSIP Pattern  - Bronch BAL early 2015: BAL shows lot of neutrophils and ddx is acut viral/bacterial infection (Florien had just completed abx Rx) or aspiration (please check on this). Main thing is no opportunisitc infection given her extensive immunosuppressive regimen.   Pulmonary function test 09/08/2013: Show stability. FVC 1.78 L/77%. FEV1 1.6 L/90%. Ratio of 91/118%. Still notices restriction. Total lung capacity 3.57/70% and confirms restriction. DLCO 12.5/51% and consistent with restrictive interstitial lung disease. If at all Fev1 and TLC are improved compared to Dec 2014  Walk test 10/04/13 is currently on 2 liters of oxygen at Pulmonary Rehab.   During her initial 6MWT on 08/11/13 she de-saturated to 85% on room air.  2 liters kept her above 90%.     #Flare ups OV 07/18/2013 - #Acute bronchitis, RX with doxy and pred    OV 01/12/2014  Chief Complaint  Patient presents with  . Follow-up    Pt here after PFT's, CT and ONO. Pt states her breathing is unchanged. Pt states she is still doing pulmonary rehab. Pt states she gets SOB and CP at times when active. Pt denies cough.      Followup dyspnea in the setting of morbid obesity,  and rheumatoid arthritis related interstitial lung disease. Currently on prednisone, Plaquenil and actmera  This visit is to followup with series of tests that she underwent to evaluate for multifactorial dyspnea because she could not go cardiopulmonary stress testing in may 2015 due to low Fev1. The combination of pulmonary function tests, CT scan of the chest, walking desaturation test and overnight desaturation test all she'll presence of exertional hypoxemia , restriction with low diffusion and presence of interstitial lung disease which is not any worse compared to last year 2014. In other words her interstitial lung disease is nonprogressive.  She also underwent cardiac testing with Dr. Gwenlyn Found. She had echocardiogram that was normal except for grade 1 diastolic dysfunction and had a normal stress Myoview test.  She continues to be dyspneic but it appears now that pulmonary rehabilitation is helping and may be her dyspnea  somewhat better but overall she still has class-3 exertional dyspnea is relieved by rest without associated chest pain  Of note, in terms of her rheumatoid arthritis she says her current cocktail of medications are not helping as much and the joint pain is worse. I suggested that she spike speak to a rheumatologist Dr Bronson Curb about therapy modificatoos   Test review:    CT chest 10/05/13 - ILD is stable.   Walking desat test 10/05/13:  RA resting: 93% RA ambulating: 85% 2L  ambulating: 96%   ONO on RA 10/06/13 sho desatruation. Mean pulse ox 82.7%, time < 88% at 3h and 14 sec.     Pulmonary function tests 01/12/14:  shows post bronchodilator FVC of 1.46 L or 70%. Total lung capacity of 2.6 L or 72%. DLCO 16.7/77%. PFTs are consistent with restriction and interstitial lung disease. PFT show decline in lungFVC and TLC but also coressponds to weight gain. DLCO is the same  Review of Systems  Constitutional: Negative for fever and unexpected weight change.  HENT: Negative for congestion, dental problem, ear pain, nosebleeds, postnasal drip, rhinorrhea, sinus pressure, sneezing, sore throat and trouble swallowing.   Eyes: Negative for redness and itching.  Respiratory: Positive for shortness of breath. Negative for cough, chest tightness and wheezing.   Cardiovascular: Positive for chest pain and leg swelling. Negative for palpitations.  Gastrointestinal: Negative for nausea and vomiting.  Genitourinary: Negative for dysuria.  Musculoskeletal: Negative for joint swelling.  Skin: Negative for rash.  Neurological: Negative for headaches.  Hematological: Does not bruise/bleed easily.  Psychiatric/Behavioral: Negative for dysphoric mood. The patient is not nervous/anxious.        Objective:   Physical Exam  Filed Vitals:   01/12/14 1409  BP: 116/70  Pulse: 87  Height: _0  (1.575 m)  Weight: 290 lb (131.543 kg)  SpO2: 95%     chrnically ill looking, obese  HEENT: nl dentition, turbinates, and orophanx. Nl external ear canals without cough reflex   NECK :  without JVD/Nodes/TM/ nl carotid upstrokes bilaterally   LUNGS: no acc muscle use, clear to A and P bilaterally without cough on insp or exp maneuvers. CRACKLES at base +   CV:  RRR  no s3 or murmur or increase in P2, tr  edema   ABD:  soft and nontender with nl excursion in the supine position. No bruits or organomegaly, bowel sounds nl  MS:  warm without deformities, calf tenderness,  cyanosis or clubbing. USES CANE  SKIN: warm and dry without lesions    NEURO:  alert, approp, no deficits          Assessment & Plan:  #I shortness of breath  - this is due to Pulmonary Fibrosis, weight (obesity) and heart muscle not relaxing well when walking (called diastolic dysfunction) - use o2 with exertion and sleep  - aim to lose weight - continue pulmonary rehab  #Intersitial Lung Disease (ILD, aka pulmonary fibros) - this appears stable based on CT scans and breathing test - this could get worse in the future - there is no cure for this  - to prevent it from getting worse, Dr Estanislado Pandy and you  could consider a medicine called immuran or cellcept but you need to discuss with her about risks v benefits; perhaps due to stability continue current RA treatment without change   #FOllowup  3 months iwht office spirometry (not by TammyWilson) during followup   > 50% of this > 25 min visit spent in  face to face counseling (15 min visit converted to 25 min)

## 2014-01-12 NOTE — Progress Notes (Signed)
PFT done today. 

## 2014-01-12 NOTE — Patient Instructions (Addendum)
#  I shortness of breath  - this is due to Pulmonary Fibrosis, weight and heart muscle not relaxing well when walking (called diastolic dysfunction) - use o2 with exertion and sleep  - aim to lose weight - continue pulmonary rehab  #Intersitial Lung Disease (ILD, aka pulmonary fibros) - this appears stable based on CT scans and breathing test - this could get worse in the future - there is no cure for this  - to prevent it from getting worse, Dr Estanislado Pandy and you  could consider a medicine called immuran or cellcept but you need to discuss with her about risks v benefits   #FOllowup  3 months iwht office spirometry (not by TammyWilson) during followup

## 2014-01-16 DIAGNOSIS — H04129 Dry eye syndrome of unspecified lacrimal gland: Secondary | ICD-10-CM | POA: Diagnosis not present

## 2014-01-16 DIAGNOSIS — H524 Presbyopia: Secondary | ICD-10-CM | POA: Diagnosis not present

## 2014-01-16 DIAGNOSIS — E119 Type 2 diabetes mellitus without complications: Secondary | ICD-10-CM | POA: Diagnosis not present

## 2014-01-16 DIAGNOSIS — H25019 Cortical age-related cataract, unspecified eye: Secondary | ICD-10-CM | POA: Diagnosis not present

## 2014-01-16 DIAGNOSIS — Z79899 Other long term (current) drug therapy: Secondary | ICD-10-CM | POA: Diagnosis not present

## 2014-01-16 DIAGNOSIS — M069 Rheumatoid arthritis, unspecified: Secondary | ICD-10-CM | POA: Diagnosis not present

## 2014-01-16 DIAGNOSIS — H2510 Age-related nuclear cataract, unspecified eye: Secondary | ICD-10-CM | POA: Diagnosis not present

## 2014-01-17 ENCOUNTER — Encounter (HOSPITAL_COMMUNITY)
Admission: RE | Admit: 2014-01-17 | Discharge: 2014-01-17 | Disposition: A | Discharge status: Home / Self Care | Payer: Medicare Other | Source: Ambulatory Visit | Attending: Internal Medicine | Admitting: Internal Medicine

## 2014-01-17 DIAGNOSIS — Z5189 Encounter for other specified aftercare: Secondary | ICD-10-CM | POA: Insufficient documentation

## 2014-01-17 DIAGNOSIS — E669 Obesity, unspecified: Secondary | ICD-10-CM | POA: Diagnosis not present

## 2014-01-17 DIAGNOSIS — R079 Chest pain, unspecified: Secondary | ICD-10-CM | POA: Insufficient documentation

## 2014-01-17 DIAGNOSIS — R262 Difficulty in walking, not elsewhere classified: Secondary | ICD-10-CM | POA: Insufficient documentation

## 2014-01-17 DIAGNOSIS — M76899 Other specified enthesopathies of unspecified lower limb, excluding foot: Secondary | ICD-10-CM | POA: Insufficient documentation

## 2014-01-17 DIAGNOSIS — I10 Essential (primary) hypertension: Secondary | ICD-10-CM | POA: Diagnosis not present

## 2014-01-17 DIAGNOSIS — M545 Low back pain, unspecified: Secondary | ICD-10-CM | POA: Insufficient documentation

## 2014-01-17 DIAGNOSIS — J8410 Pulmonary fibrosis, unspecified: Secondary | ICD-10-CM | POA: Diagnosis not present

## 2014-01-17 NOTE — Progress Notes (Signed)
Today, [name] exercised at [facility]. [facility]. Service time was from 1030 to 1215.  The patient exercised for more than 31 minutes performing aerobic, strengthening, and stretching exercises. Oxygen saturation, heart rate, blood pressure, rate of perceived exertion, and shortness of breath were all monitored before, during, and after exercise. [name] presented with no problems at today's exercise session.   There was no workload change during today's exercise session.  Pre-exercise vitals:   Weight kg: 132.8   Liters of O2: 2L   SpO2: 96   HR: 82   BP: 124/70   CBG: na  Exercise vitals:   Highest heartrate:  96   Lowest oxygen saturation: 96   Highest blood pressure: 126/70   Liters of 02: 2L  Post-exercise vitals:   SpO2: 97   HR: 82   BP: 112/64   Liters of O2: 2L   CBG: na  Dr. Brand Males, Medical Director Dr. Broadus John is immediately available during today's Pulmonary Rehab session for [name] on 01/17/2014 at 1030 class time.

## 2014-01-18 ENCOUNTER — Encounter: Payer: Self-pay | Admitting: Physical Medicine & Rehabilitation

## 2014-01-18 ENCOUNTER — Encounter: Payer: Medicare Other | Attending: Physical Medicine & Rehabilitation | Admitting: Physical Medicine & Rehabilitation

## 2014-01-18 VITALS — BP 128/62 | HR 98 | Resp 16 | Wt 290.6 lb

## 2014-01-18 DIAGNOSIS — E1142 Type 2 diabetes mellitus with diabetic polyneuropathy: Secondary | ICD-10-CM | POA: Diagnosis not present

## 2014-01-18 DIAGNOSIS — M069 Rheumatoid arthritis, unspecified: Secondary | ICD-10-CM | POA: Diagnosis not present

## 2014-01-18 DIAGNOSIS — E1149 Type 2 diabetes mellitus with other diabetic neurological complication: Secondary | ICD-10-CM | POA: Diagnosis not present

## 2014-01-18 DIAGNOSIS — F329 Major depressive disorder, single episode, unspecified: Secondary | ICD-10-CM | POA: Diagnosis not present

## 2014-01-18 DIAGNOSIS — M5416 Radiculopathy, lumbar region: Secondary | ICD-10-CM

## 2014-01-18 DIAGNOSIS — M48061 Spinal stenosis, lumbar region without neurogenic claudication: Secondary | ICD-10-CM

## 2014-01-18 DIAGNOSIS — IMO0002 Reserved for concepts with insufficient information to code with codable children: Secondary | ICD-10-CM

## 2014-01-18 DIAGNOSIS — J8410 Pulmonary fibrosis, unspecified: Secondary | ICD-10-CM | POA: Diagnosis not present

## 2014-01-18 DIAGNOSIS — M7062 Trochanteric bursitis, left hip: Secondary | ICD-10-CM

## 2014-01-18 DIAGNOSIS — F3289 Other specified depressive episodes: Secondary | ICD-10-CM | POA: Diagnosis not present

## 2014-01-18 DIAGNOSIS — J849 Interstitial pulmonary disease, unspecified: Secondary | ICD-10-CM

## 2014-01-18 DIAGNOSIS — M48062 Spinal stenosis, lumbar region with neurogenic claudication: Secondary | ICD-10-CM | POA: Diagnosis not present

## 2014-01-18 DIAGNOSIS — M47817 Spondylosis without myelopathy or radiculopathy, lumbosacral region: Secondary | ICD-10-CM

## 2014-01-18 DIAGNOSIS — M76899 Other specified enthesopathies of unspecified lower limb, excluding foot: Secondary | ICD-10-CM

## 2014-01-18 DIAGNOSIS — E1140 Type 2 diabetes mellitus with diabetic neuropathy, unspecified: Secondary | ICD-10-CM

## 2014-01-18 DIAGNOSIS — M47816 Spondylosis without myelopathy or radiculopathy, lumbar region: Secondary | ICD-10-CM

## 2014-01-18 MED ORDER — DICLOFENAC SODIUM 1 % TD GEL: 1.0000 "application " | Freq: Three times a day (TID) | TRANSDERMAL | Status: DC

## 2014-01-18 MED ORDER — MORPHINE SULFATE 15 MG PO TABS: 15.0000 mg | ORAL_TABLET | Freq: Four times a day (QID) | ORAL | Status: DC | PRN

## 2014-01-18 NOTE — Progress Notes (Signed)
Subjective:    Patient ID: [name], female    DOB: Aug 05, 1940, 73 y.o.   MRN: [id number]  HPI [name] is back regarding her chronic pain.  She has diagnosed with diastolic heart failure in addition to her interstitial lung disease. She continues in pulmonary rehab. Her interstitial lung disease has not progressed.   Her pain is in her low back predominantly. It sometimes shoots down her back into her legs.  She has more pain if she stands or walks for a prolonged period of time.  The tingling in her legs has been better. She is now having numbness and tingling in her finger tips however. Her right knee is tender and sometimes swells when she's up on it frequently.   We reviewed her MRI from 2/14 which revealed: L4-5 multifactorial moderate to marked spinal stenosis. Mild  bilateral foraminal narrowing.  L3-4 multifactorial moderate spinal stenosis.  L2-3 multifactorial mild to slightly moderate spinal stenosis.  L1-2 multifactorial mild spinal stenosis.  L5-S1 moderate facet joint degenerative changes.   All of these levels revealed substantial facet changes as well.  Her right knee xr from the same time shows moderate tricompartmental change   Pain Inventory Average Pain 7 Pain Right Now 5 My pain is intermittent, stabbing and aching  In the last 24 hours, has pain interfered with the following? General activity 0 Relation with others 0 Enjoyment of life 0 What TIME of day is your pain at its worst? morning Sleep (in general) Good  Pain is worse with: walking, bending and some activites Pain improves with: medication Relief from Meds: 8  Mobility walk with assistance use a cane use a walker do you drive?  yes  Function I need assistance with the following:  meal prep, household duties and shopping  Neuro/Psych bladder control problems numbness tingling trouble walking anxiety  Prior Studies Any changes since last visit?  no  Physicians  involved in your care Any changes since last visit?  no   Family History  Problem Relation Age of Onset  . Diabetes Mother   . Hypertension Father   . Diabetes Sister   . Diabetes Brother   . Hypertension Brother   . Heart attack Mother   . Lung cancer Father    History   Social History  . Marital Status: Married    Spouse Name: N/A    Number of Children: N/A  . Years of Education: N/A   Social History Main Topics  . Smoking status: Never Smoker   . Smokeless tobacco: None  . Alcohol Use: No  . Drug Use: No  . Sexual Activity: Not Currently   Other Topics Concern  . None   Social History Narrative  . None   Past Surgical History  Procedure Laterality Date  . Abdominal hysterectomy    . Ovary surgery    . Shoulder surgery    . Brain surgery      Gamma knife 10/13. Needs repeat spring  '14  . Video bronchoscopy Bilateral 05/31/2013    Procedure: VIDEO BRONCHOSCOPY WITHOUT FLUORO;  Surgeon: Brand Males, MD;  Location: [facility];  Service: Cardiopulmonary;  Laterality: Bilateral;   Past Medical History  Diagnosis Date  . Diabetes mellitus   . Hypertension   . Thyroid disease   . Arthritis     endstage changes bilateral knees/bilateral ankles.   . High cholesterol   . 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 02/17/2012    Continue diplopia,  left eye pain and left headaches.    . Depression, reactive   . RA (rheumatoid arthritis)     has been off methotreaxte since 10/13.  . Contusion of left knee     due to fall 1/14.  [name] URI (upper respiratory infection)   . Clotting disorder   . Asthma   . Carotid artery occlusion   . Interstitial lung disease   . Morbid obesity   . Shortness of breath   . Family history of heart disease    BP 128/62  Pulse 98  Resp 16  Wt 290 lb 9.6 oz (131.815 kg)  SpO2 92%  Opioid Risk Score:   Fall Risk Score: High Fall Risk (>13 points) (previously educated and given handout) Review of Systems    Respiratory: Positive for shortness of breath.        Uses o2 at hs and if doing things around house  Genitourinary:       Bladder control problems  Musculoskeletal: Positive for gait problem.  Neurological:       Tingling  Psychiatric/Behavioral: The patient is nervous/anxious.   All other systems reviewed and are negative.      Objective:   Physical Exam  General: Alert and oriented x 3, No apparent distress  HEENT: Head is normocephalic, atraumatic, PERRLA, EOMI, sclera anicteric, oral mucosa pink and moist, dentition intact, ext ear canals clear,  Neck: Supple without JVD or lymphadenopathy  Heart: Reg rate and rhythm. No murmurs rubs or gallops  Chest: sob with gait, sometimes at rest. Phonates well  Abdomen: Soft, non-tender, non-distended, bowel sounds positive.  Extremities: No clubbing, cyanosis, 2+ edema. Pulses are 2+  Skin: Clean and intact without signs of breakdown  Neuro: Pt is cognitively appropriate with normal insight, memory, and awareness. Cranial nerves 2-12 are intact. Sensory exam is near normal in the legs... Reflexes are 2+ in all 4's. Fine motor coordination is intact. No tremors. Motor function is grossly 4 to 5/5 with pain inhbition. SLR was equivocal.  Musculoskeletal: low back and left hip still tender. . She is slightly elevated in the left pelvis. She is antalgic with gait on the left. Mild joint effusion noted right knee. Crepitus in both knees. No gross knee instability. She needed to extra time to stand from sitting.  Psych: Pt's affect is appropriate. Pt is cooperative   Assessment & Plan:   Assessment:  1. Lumbar spinal stenosis with neurogenic claudication. Associated facet arthropathy 2. Depression.  3. Diabetes mellitus type 2 with polyneuropathy 4. Rheumatoid arthritis. Multiple joint involvement including knees.  5. Left greater troch bursitis  6. Interstitial lung disease    Plan:  1. Ongoing pulmonary rehab through october 2. Will  send to PT for facet based therapy. Consider MBB's. 3. MSO4 for breakthrough pain, one q8 prn #75---refilled today  -continue tramadol for less severe pain up to 3-4x per day (did not need refills) 4.  Discussed weight loss, sugar control as well. She is working on this 5. Continue cymbalta to 36m bid for neuropathic and axial spine pain.  6. Follow up with me or NP in about 1 months. 30 minutes of face to face patient care time were spent during this visit. All questions were encouraged and answered.

## 2014-01-18 NOTE — Patient Instructions (Signed)
PLEASE CALL ME WITH ANY PROBLEMS OR QUESTIONS (#[phone]).      

## 2014-01-19 ENCOUNTER — Encounter (HOSPITAL_COMMUNITY)
Admission: RE | Admit: 2014-01-19 | Discharge: 2014-01-19 | Disposition: A | Discharge status: Home / Self Care | Payer: Medicare Other | Source: Ambulatory Visit | Attending: Internal Medicine | Admitting: Internal Medicine

## 2014-01-19 DIAGNOSIS — Z5189 Encounter for other specified aftercare: Secondary | ICD-10-CM | POA: Diagnosis not present

## 2014-01-19 NOTE — Progress Notes (Signed)
Today, Chinara exercised at [facility]. [facility]. Service time was from 1030 to 1230.  The patient exercised for more than 31 minutes performing aerobic, strengthening, and stretching exercises. Oxygen saturation, heart rate, blood pressure, rate of perceived exertion, and shortness of breath were all monitored before, during, and after exercise. Florean presented with no problems at today's exercise session. Patient attended the oxygen safety class today.  There was no workload change during today's exercise session.  Pre-exercise vitals:   Weight kg: 131.9   Liters of O2: 2   SpO2: 96   HR: 83   BP: 102/64   CBG: NA  Exercise vitals:   Highest heartrate:  93   Lowest oxygen saturation: 96   Highest blood pressure: 122/66   Liters of 02: 2  Post-exercise vitals:   SpO2: 96   HR: 80   BP: 108/70   Liters of O2: 2   CBG: NA Dr. Brand Males, Medical Director Dr. Wendee Beavers is immediately available during today's Pulmonary Rehab session for Cariann E Heidelberg on 01/19/2014 at 1030 class time.

## 2014-01-24 ENCOUNTER — Encounter (HOSPITAL_COMMUNITY): Payer: Medicare Other

## 2014-01-25 ENCOUNTER — Encounter (HOSPITAL_COMMUNITY): Payer: Medicare Other

## 2014-01-26 ENCOUNTER — Encounter (HOSPITAL_COMMUNITY)
Admission: RE | Admit: 2014-01-26 | Discharge: 2014-01-26 | Disposition: A | Discharge status: Home / Self Care | Payer: Medicare Other | Source: Ambulatory Visit | Attending: Internal Medicine | Admitting: Internal Medicine

## 2014-01-26 DIAGNOSIS — Z5189 Encounter for other specified aftercare: Secondary | ICD-10-CM | POA: Diagnosis not present

## 2014-01-26 NOTE — Progress Notes (Signed)
Today, Saydee exercised at [facility]. Service time was from 10:30 to 12:30.  The patient exercised for more than 31 minutes performing aerobic, strengthening, and stretching exercises. Oxygen saturation, heart rate, blood pressure, rate of perceived exertion, and shortness of breath were all monitored before, during, and after exercise. [name] presented with no problems at today's exercise session.  The patient attended education class on Nutrition with Derek Mound.  There was no workload change during today's exercise session.  Pre-exercise vitals:   Weight kg: 132.1   Liters of O2: 2   SpO2: 100   HR: 90   BP: 128/64   CBG: na  Exercise vitals:   Highest heartrate:  101   Lowest oxygen saturation: 95   Highest blood pressure: 126/82   Liters of 02: 2  Post-exercise vitals:   SpO2: 97   HR: 87   BP: 116/64   Liters of O2: 2   CBG: na  Dr. Brand Males, Medical Director Dr. Coralyn Pear is immediately available during today's Pulmonary Rehab session for Collin E Berrett on 01/26/14 at 10:30am class time.

## 2014-01-31 ENCOUNTER — Encounter (HOSPITAL_COMMUNITY)
Admission: RE | Admit: 2014-01-31 | Discharge: 2014-01-31 | Disposition: A | Discharge status: Home / Self Care | Payer: Medicare Other | Source: Ambulatory Visit | Attending: Internal Medicine | Admitting: Internal Medicine

## 2014-01-31 DIAGNOSIS — Z5189 Encounter for other specified aftercare: Secondary | ICD-10-CM | POA: Diagnosis not present

## 2014-01-31 NOTE — Progress Notes (Signed)
Today, Britiany exercised at [facility]. [facility]. Service time was from 1030 to 1215.  The patient exercised for more than 31 minutes performing aerobic, strengthening, and stretching exercises. Oxygen saturation, heart rate, blood pressure, rate of perceived exertion, and shortness of breath were all monitored before, during, and after exercise. Eknoor presented with no problems at today's exercise session.   There was no workload change during today's exercise session.  Pre-exercise vitals:   Weight kg: 133.0   Liters of O2: 2L   SpO2: 98   HR: 89   BP: 100/60   CBG: na  Exercise vitals:   Highest heartrate:  105   Lowest oxygen saturation: 95   Highest blood pressure: 136/60   Liters of 02: 2L  Post-exercise vitals:   SpO2: 98   HR: 87   BP: 98/60   Liters of O2: 2L   CBG: na  Dr. Brand Males, Medical Director Dr. Coralyn Pear is immediately available during today's Pulmonary Rehab session for Donnajean E Blust on 01/31/2014 at 1030 class time.

## 2014-02-02 ENCOUNTER — Encounter (HOSPITAL_COMMUNITY)
Admission: RE | Admit: 2014-02-02 | Discharge: 2014-02-02 | Disposition: A | Discharge status: Home / Self Care | Payer: Medicare Other | Source: Ambulatory Visit | Attending: Internal Medicine | Admitting: Internal Medicine

## 2014-02-02 DIAGNOSIS — I519 Heart disease, unspecified: Secondary | ICD-10-CM | POA: Diagnosis not present

## 2014-02-02 DIAGNOSIS — R0902 Hypoxemia: Secondary | ICD-10-CM | POA: Diagnosis not present

## 2014-02-02 DIAGNOSIS — M069 Rheumatoid arthritis, unspecified: Secondary | ICD-10-CM | POA: Diagnosis not present

## 2014-02-02 DIAGNOSIS — Z5189 Encounter for other specified aftercare: Secondary | ICD-10-CM | POA: Diagnosis not present

## 2014-02-02 DIAGNOSIS — Q934 Deletion of short arm of chromosome 5: Secondary | ICD-10-CM | POA: Diagnosis not present

## 2014-02-02 NOTE — Progress Notes (Signed)
Today, Khylee exercised at [facility]. [facility]. Service time was from 1030 to 1230.  The patient exercised for more than 31 minutes performing aerobic, strengthening, and stretching exercises. Oxygen saturation, heart rate, blood pressure, rate of perceived exertion, and shortness of breath were all monitored before, during, and after exercise. Heidi presented with no problems at today's exercise session. Britlee also attended an education session on exercise for the pulmonary patient.  There was no workload change during today's exercise session.  Pre-exercise vitals:   Weight kg: 133.5   Liters of O2: 2L   SpO2: 99   HR: 93   BP: 122/74   CBG: na  Exercise vitals:   Highest heartrate:  112   Lowest oxygen saturation: 95   Highest blood pressure: 120/72   Liters of 02: 2L  Post-exercise vitals:   SpO2: 97   HR: 95   BP: 114/60   Liters of O2: 2L   CBG: na  Dr. Brand Males, Medical Director Dr. Wyline Copas is immediately available during today's Pulmonary Rehab session for Avrie E Dues on 02/02/2014 at 1030 class time.

## 2014-02-06 ENCOUNTER — Ambulatory Visit: Payer: Medicare Other | Attending: Physical Medicine & Rehabilitation | Admitting: Physical Therapy

## 2014-02-06 DIAGNOSIS — M25559 Pain in unspecified hip: Secondary | ICD-10-CM | POA: Diagnosis not present

## 2014-02-06 DIAGNOSIS — R262 Difficulty in walking, not elsewhere classified: Secondary | ICD-10-CM | POA: Insufficient documentation

## 2014-02-06 DIAGNOSIS — IMO0001 Reserved for inherently not codable concepts without codable children: Secondary | ICD-10-CM | POA: Diagnosis not present

## 2014-02-06 DIAGNOSIS — M545 Low back pain, unspecified: Secondary | ICD-10-CM | POA: Insufficient documentation

## 2014-02-07 ENCOUNTER — Encounter (HOSPITAL_COMMUNITY)
Admission: RE | Admit: 2014-02-07 | Discharge: 2014-02-07 | Disposition: A | Discharge status: Home / Self Care | Payer: Medicare Other | Source: Ambulatory Visit | Attending: Internal Medicine | Admitting: Internal Medicine

## 2014-02-07 DIAGNOSIS — Z5189 Encounter for other specified aftercare: Secondary | ICD-10-CM | POA: Diagnosis not present

## 2014-02-07 NOTE — Progress Notes (Signed)
Today, [name] exercised at [facility]. [facility]. Service time was from 10:30am to 12:15pm.  The patient exercised for more than 31 minutes performing aerobic, strengthening, and stretching exercises. Oxygen saturation, heart rate, blood pressure, rate of perceived exertion, and shortness of breath were all monitored before, during, and after exercise. [name] presented with no problems at today's exercise session.   There was an increase in workload change during today's exercise session.  Pre-exercise vitals:   Weight kg: 131.2   Liters of O2: 2   SpO2: 95   HR: 87   BP: 110/60   CBG: na  Exercise vitals:   Highest heartrate:  127   Lowest oxygen saturation: 97   Highest blood pressure: 144/74   Liters of 02: 2  Post-exercise vitals:   SpO2: 99   HR: 83   BP: 106/64   Liters of O2: 2   CBG: na  Dr. Brand Males, Medical Director Dr. Wyline Copas is immediately available during today's Pulmonary Rehab session for Sarah-Jane E Fraticelli on 02/07/14 at 10:30am class time.

## 2014-02-08 DIAGNOSIS — Z23 Encounter for immunization: Secondary | ICD-10-CM | POA: Diagnosis not present

## 2014-02-09 ENCOUNTER — Encounter (HOSPITAL_COMMUNITY)
Admission: RE | Admit: 2014-02-09 | Discharge: 2014-02-09 | Disposition: A | Discharge status: Home / Self Care | Payer: Medicare Other | Source: Ambulatory Visit | Attending: Internal Medicine | Admitting: Internal Medicine

## 2014-02-09 ENCOUNTER — Encounter: Payer: Self-pay | Admitting: *Deleted

## 2014-02-09 DIAGNOSIS — Z5189 Encounter for other specified aftercare: Secondary | ICD-10-CM | POA: Diagnosis not present

## 2014-02-10 ENCOUNTER — Encounter: Payer: Self-pay | Admitting: Neurology

## 2014-02-10 ENCOUNTER — Ambulatory Visit (INDEPENDENT_AMBULATORY_CARE_PROVIDER_SITE_OTHER): Payer: Medicare Other | Admitting: Neurology

## 2014-02-10 VITALS — BP 118/94 | HR 64 | Wt 293.0 lb

## 2014-02-10 DIAGNOSIS — R4701 Aphasia: Secondary | ICD-10-CM

## 2014-02-10 DIAGNOSIS — D320 Benign neoplasm of cerebral meninges: Secondary | ICD-10-CM | POA: Diagnosis not present

## 2014-02-10 DIAGNOSIS — D329 Benign neoplasm of meninges, unspecified: Secondary | ICD-10-CM

## 2014-02-10 NOTE — Progress Notes (Signed)
Discharge note:  Jahniyah is being discharged from pulmonary rehab after completing all of her 36 exercise sessions. Valta made significant progress in her stamina and stregnth as evidanced by being able to tolerate increases in her work loads and increasig the number of laps she was able to walk. Shanera had goals of being able to do more around her home independently and to travel more with her husband. Unfortunately, her husband has had to have back surgery and has not been able to travel. During his recovery phase, Amity stated she was able to continue to care for the home. She has a significant amount of pain from her spinal stenosis which also limits her ability to be active. She states she has definitely benefited from the pulmonary rehab program and will now begin physical therapy so she can remain active and be able to continue an exercise regimen at home. Upon discharge Zailyn's PHQ2 was 0.

## 2014-02-10 NOTE — Patient Instructions (Signed)
Overall you are doing fairly well but I do want to suggest a few things today:   Remember to drink plenty of fluid, eat healthy meals and do not skip any meals. Try to eat protein with a every meal and eat a healthy snack such as fruit or nuts in between meals. Try to keep a regular sleep-wake schedule and try to exercise daily, particularly in the form of walking, 20-30 minutes a day, if you can.   As far as diagnostic testing: MRI of the brain  I would like to see you back in 3 months, sooner if we need to. Please call us with any interim questions, concerns, problems, updates or refill requests.   Please also call us for any test results so we can go over those with you on the phone.  My clinical assistant and will answer any of your questions and relay your messages to me and also relay most of my messages to you.   Our phone number is [phone]. We also have an after hours call service for urgent matters and there is a physician on-call for urgent questions. For any emergencies you know to call 911 or go to the nearest emergency room

## 2014-02-10 NOTE — Progress Notes (Addendum)
[facility]  Provider:  Dr Jaynee Eagles Referring Provider: Glendale Chard, MD Primary Care Physician:  Maximino Greenland, MD  CC:  Difficulty speaking  HPI:  [name] is a 73 y.o. female here as a consult from Dr. Baird Cancer for trouble speaking. Patient has a PMHx of cavernous sinus meningioma, morbid obesity, RA on cellcept, gout, interstitial lung disease, osteoarthritis, diastolic dysfunction, chronic renal insufficiency, hypoxemia on O2, DM. Patient reports difficulty speaking for the last month. She knows what she is going to say but can't get it out. She has to focus real hard to get it out. She means to say something and another word comes out. Happening for a month and worsening. Her family is noticing it. Occurs "quite frequently" daily. The word that comes out is completely different. Can't give an example. Not slurred speech. No other neurologic symptoms. No vision changes, no facial drooping, no focal weakness, no new numbness or tingling. She understands everything people is saying. Sometimes she does not know that she made the mistake but family tells her. Really affecting her life, she is aware and worried. Having difficulty swallowing for years, she has  Diagnosed "dysfunctional mobility" which is stable. Symptoms are stable all day, not worse or better and no triggers. She is taking aspirin every day for stroke prevention.   Reviewed notes, labs and imaging from outside physicians, which showed labs significant for CMP with estimated GFR 43(creatinine 1.26), CBC with platelets 136. Notes from rheumatology refer patient for trouble talking but no details or specific symtpoms documented in their notes.   CT of the head: 2013 IMPRESSION:  1. No acute intracranial pathology seen on CT.  2. Stable 1.2 cm mass at the left cavernous sinus, with posterior  extension off the posterior clinoid process. This is most  compatible with a cavernous sinus meningioma, as  noted on prior  MRI.  3. Scattered chronic ischemic change within the basal ganglia  Bilaterally.  MRI of the brain 2013:  IMPRESSION:  1. Infiltrative lesion of the left cavernous sinus with posterior  extension off the posterior clinoid process as seen on the  comparison CT. Associated dural thickening and dural tail  extending in the left middle cranial fossa.  In conjunction with the MRA findings (see below) and absent any  sphenoid sinus disease, favor this is a left cavernous sinus  meningioma.  Malignant tumor such as lymphoma is considered but felt less  likely.  2. Left cavernous sinus infiltration by the mass and dural  thickening surrounding the left gasserian ganglion. No definite  left orbital apex involvement.  3. No other acute intracranial abnormality. Nonspecific signal  changes in the brain, favor due to chronic small vessel disease.  4. MRA findings are below.  MRA HEAD WITHOUT CONTRAST  Technique: Angiographic images of the Circle of Willis were  obtained using MRA technique without intravenous contrast.  Findings: Artifact at the cervicomedullary junction.  Antegrade flow in the posterior circulation with codominant distal  vertebral arteries. The no distal vertebral stenosis. Normal  vertebrobasilar junction. No basilar stenosis. Mild basilar  tortuosity. AICA vessel origins are patent. SCA and PCA origins  are patent. Fetal type left PCA. Bilateral PCA branches are  within normal limits. Left posterior communicating artery  diminutive or absent.  Antegrade flow in both ICA siphons. No ICA stenosis.  In the left cavernous ICA and left ICA terminus there is no flow  signal in the region of the soft tissue mass (series  9 image 67).  Normal bilateral MCA origins. Normal left ACA origin. Hypoplastic  right ACA A1 segment with an infundibulum at its origin. Anterior  communicating artery within normal limits. Visualized ACA branches  within normal limits.  Visualized bilateral MCA branches within  normal limits.  IMPRESSION:  1. No flow signal in the region of the left cavernous sinus mass,  in keeping with the favored diagnosis of meningioma.  2. Essentially negative intracranial MRA; hypoplastic right ACA A1  segment with an infundibulum at its origin.  Review of Systems: Patient complains of symptoms per HPI as well as the following symptoms weight gain, easy bruising, chest pain, swelling in legs, SOB, feeling hot and cold, trouble swallowing, incontinence, diarrhea, aching muscles, moles, headache, numbness, weakness, too much sleep. Pertinent negatives per HPI. All others negative.   History   Social History  . Marital Status: Married    Spouse Name: N/A    Number of Children: 6  . Years of Education: college   Occupational History  . retired    Social History Main Topics  . Smoking status: Never Smoker   . Smokeless tobacco: Never Used  . Alcohol Use: No  . Drug Use: No  . Sexual Activity: Not Currently   Other Topics Concern  . Not on file   Social History Narrative   Patient consumes 2-3 cups coffee per day.    Family History  Problem Relation Age of Onset  . Diabetes Mother   . Heart attack Mother   . Hypertension Father   . Lung cancer Father   . Diabetes Sister   . Diabetes Brother   . Hypertension Brother     Past Medical History  Diagnosis Date  . Diabetes mellitus   . Hypertension   . Thyroid disease   . Arthritis     endstage changes bilateral knees/bilateral ankles.   . High cholesterol   . 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 02/17/2012    Continue diplopia, left eye pain and left headaches.    . Depression, reactive   . RA (rheumatoid arthritis)     has been off methotreaxte since 10/13.  . Contusion of left knee     due to fall 1/14.  [name] URI (upper respiratory infection)   . Clotting disorder   . Asthma   . Carotid artery occlusion   . Interstitial lung disease     . Morbid obesity   . Shortness of breath   . Family history of heart disease     Past Surgical History  Procedure Laterality Date  . Abdominal hysterectomy    . Ovary surgery    . Shoulder surgery    . Brain surgery      Gamma knife 10/13. Needs repeat spring  '14  . Video bronchoscopy Bilateral 05/31/2013    Procedure: VIDEO BRONCHOSCOPY WITHOUT FLUORO;  Surgeon: Brand Males, MD;  Location: [facility];  Service: Cardiopulmonary;  Laterality: Bilateral;    Current Outpatient Prescriptions  Medication Sig Dispense Refill  . acetaminophen (TYLENOL) 500 MG tablet Take 1,000 mg by mouth 2 (two) times daily as needed for pain. Takes with tramadol      . albuterol (PROVENTIL HFA;VENTOLIN HFA) 108 (90 BASE) MCG/ACT inhaler Inhale 2 puffs into the lungs every 6 (six) hours as needed for wheezing or shortness of breath.      . allopurinol (ZYLOPRIM) 100 MG tablet Take 200 mg by mouth daily.      [name] aspirin EC  81 MG tablet Take 81 mg by mouth daily.      [name] atorvastatin (LIPITOR) 20 MG tablet Take 20 mg by mouth daily.      . Calcium Carb-Cholecalciferol (CALCIUM + D3 PO) Take 2 tablets by mouth daily.      . colchicine 0.6 MG tablet Take 0.6 mg by mouth daily as needed (GOUT).      [name] diclofenac sodium (VOLTAREN) 1 % GEL Apply 1 application topically 3 (three) times daily. To right knee  3 Tube  4  . DULoxetine (CYMBALTA) 60 MG capsule Take 120 mg by mouth daily.      . furosemide (LASIX) 20 MG tablet Take 20 mg by mouth daily.      [name] gabapentin (NEURONTIN) 600 MG tablet Take 1 tablet (600 mg total) by mouth as directed. Take 2 tablet in the morning, 2 tablet in the middle of the day, and 3 tablets at night  210 tablet  1  . hydroxychloroquine (PLAQUENIL) 200 MG tablet Take 200 mg by mouth 2 (two) times daily.       [name] levothyroxine (SYNTHROID, LEVOTHROID) 50 MCG tablet Take 1 tablet (50 mcg total) by mouth daily.  30 tablet  1  . linagliptin (TRADJENTA) 5 MG TABS tablet Take 5 mg by mouth  daily.      . metoprolol succinate (TOPROL-XL) 100 MG 24 hr tablet Take 100 mg by mouth daily. Take with or immediately following a meal.      . morphine (MSIR) 15 MG tablet Take 1 tablet (15 mg total) by mouth every 6 (six) hours as needed.  75 tablet  0  . Multiple Vitamin (MULTIVITAMIN WITH MINERALS) TABS Take 1 tablet by mouth daily.      [name] olmesartan (BENICAR) 40 MG tablet Take 40 mg by mouth daily.      . OXYGEN-HELIUM IN Inhale 2 L into the lungs at bedtime.      . predniSONE (DELTASONE) 10 MG tablet Take 7.5 mg by mouth daily with breakfast.       . Spacer/Aero-Holding Chambers (AEROCHAMBER MV) inhaler Use as instructed  1 each  0  . traMADol (ULTRAM) 50 MG tablet Take 1 tablet (50 mg total) by mouth every 4 (four) hours as needed.  120 tablet  3  . Vitamin D, Ergocalciferol, (DRISDOL) 50000 UNITS CAPS capsule Take 50,000 Units by mouth every 7 (seven) days.      [name] dextromethorphan (DELSYM) 30 MG/5ML liquid Take by mouth as needed for cough.      . pantoprazole (PROTONIX) 40 MG tablet take 1 tablet by mouth once daily 30 TO 60 MINUTES BEFORE 1ST MEAL OF THE DAY  30 tablet  5   No current facility-administered medications for this visit.    Allergies as of 02/10/2014 - Review Complete 02/10/2014  Allergen Reaction Noted  . Codeine Swelling 09/11/2011  . Remicade [infliximab]  09/11/2011  . Zestril [lisinopril] Swelling 09/11/2011    Vitals: BP 118/94  Pulse 64  Wt 293 lb (132.904 kg) Last Weight:  Wt Readings from Last 1 Encounters:  02/10/14 293 lb (132.904 kg)   Last Height:   Ht Readings from Last 1 Encounters:  01/12/14 _0  (1.575 m)     Physical exam: Exam: Gen: NAD, conversant, well nourised, morbidly obese, well groomed, on oxygen                 CV: Distant heart sounds, No audible carotid bruits. Peripheral edema to the calfs, warm,  nontender Eyes: Conjunctivae clear without exudates or hemorrhage  Neuro: Detailed Neurologic Exam  Speech:    Speech is  normal; fluent and spontaneous with normal comprehension. Repetition, naming intact. Follows 3-step command. Can write an intact sentence. Follows written command. Cognition:    The patient is oriented to person, place, and time;     recent and remote memory intact;     language fluent;     normal attention, concentration,    fund of knowledge Cranial Nerves:    The pupils are equal, round, and reactive to light. The fundi are flat. Visual fields are full to finger confrontation. Extraocular movements are intact. Trigeminal sensation is intact and the muscles of mastication are normal. The face is symmetric. The palate elevates in the midline. Voice is normal. Shoulder shrug is normal. The tongue has normal motion without fasciculations.   Coordination:    Normal finger to nose, Can't do HTS secondary to body habitus  Gait:    Walks with cane, wide based  Motor Observation:    No asymmetry, no atrophy, and no involuntary movements noted. Tone:    Normal muscle tone.    Posture:    Posture is normal    Strength:    Weakness left hip flexion > right possibly impaired due to body habitus. Otherwise strength appears intact.      Sensation:     Intact to light touch Reflex Exam:  DTR's:    Deep tendon reflexes are hyporeflexic in the uppers; Absent lower extremity possibly due to body habitus and/or pitting edema Toes:    The toes are downgoing bilaterally.   Clonus:    Clonus is absent.   Assessment/Plan:  [name] is a 73 y.o. female here as a consult from Dr. Baird Cancer for trouble speaking. Patient has a PMHx of cavernous sinus meningioma, morbid obesity, RA on cellcept, gout, interstitial lung disease, osteoarthritis, diastolic dysfunction, chronic renal insufficiency, hypoxemia on O2, diabetes. Patient reports difficulty speaking for the last month. She knows what she is going to say but can't get it out. She has to focus real hard to get it out. She means to say  something and another word comes out. Today on neuro exam there is no aphasia or dysarthria; repetition, naming, writing, reading, following complex commands all intact. Will repeat MRI of the brain to follow her meningioma and evaluate for ischemic events or other lesions to account for her reported paraphasic errors such as stroke.   MRI w/wo contrast (contrast to eval meningioma). Last renal labs 11/2013 BUN: 22, Creatinine: 1.26, Est GFR AA 49 mL/min  04/02/2014: Received follow up from [place] at [facility]. Dr. Angelene Giovanni, MD. Patient had stereotactic radiosurgery for meningioma performed on 03/11/2012. She will follow up in his office in 2 years for repeat MRI. MRI of the brain showed no substantial change in the enhancing lesion within the left cavernous sinus likely a meningioma, similar appearance of white matter disease likely chronic microvascular ischemia.Small foci of low signal on the susceptibility weighted sequence in the temporal lobes likely represent small remote microhemorrhages.   Sarina Ill, MD  [facility] [address] [place], [place] [address]  Phone [phone] Fax [phone]

## 2014-02-12 ENCOUNTER — Encounter: Payer: Self-pay | Admitting: Neurology

## 2014-02-12 DIAGNOSIS — R4701 Aphasia: Secondary | ICD-10-CM | POA: Insufficient documentation

## 2014-02-14 ENCOUNTER — Encounter: Payer: Medicare Other | Admitting: Physical Therapy

## 2014-02-14 ENCOUNTER — Encounter (HOSPITAL_COMMUNITY): Payer: Medicare Other

## 2014-02-16 ENCOUNTER — Encounter (HOSPITAL_COMMUNITY): Payer: Medicare Other

## 2014-02-18 ENCOUNTER — Ambulatory Visit
Admission: RE | Admit: 2014-02-18 | Discharge: 2014-02-18 | Disposition: A | Discharge status: Home / Self Care | Payer: Medicare Other | Source: Ambulatory Visit | Attending: Neurology | Admitting: Neurology

## 2014-02-18 DIAGNOSIS — R4701 Aphasia: Secondary | ICD-10-CM

## 2014-02-18 DIAGNOSIS — D329 Benign neoplasm of meninges, unspecified: Secondary | ICD-10-CM | POA: Diagnosis not present

## 2014-02-18 MED ORDER — GADOBENATE DIMEGLUMINE 529 MG/ML IV SOLN
20.0000 mL | Freq: Once | INTRAVENOUS | Status: AC | PRN
  Administered 2014-02-18: 20 mL via INTRAVENOUS

## 2014-02-20 ENCOUNTER — Ambulatory Visit: Payer: Medicare Other | Attending: Physical Medicine & Rehabilitation | Admitting: Rehabilitation

## 2014-02-20 DIAGNOSIS — M25552 Pain in left hip: Secondary | ICD-10-CM | POA: Insufficient documentation

## 2014-02-20 DIAGNOSIS — R262 Difficulty in walking, not elsewhere classified: Secondary | ICD-10-CM | POA: Insufficient documentation

## 2014-02-20 DIAGNOSIS — Z5189 Encounter for other specified aftercare: Secondary | ICD-10-CM | POA: Insufficient documentation

## 2014-02-20 DIAGNOSIS — M545 Low back pain: Secondary | ICD-10-CM | POA: Insufficient documentation

## 2014-02-21 ENCOUNTER — Encounter (HOSPITAL_COMMUNITY): Payer: Medicare Other

## 2014-02-21 DIAGNOSIS — J849 Interstitial pulmonary disease, unspecified: Secondary | ICD-10-CM | POA: Insufficient documentation

## 2014-02-21 DIAGNOSIS — M25559 Pain in unspecified hip: Secondary | ICD-10-CM | POA: Insufficient documentation

## 2014-02-21 DIAGNOSIS — R220 Localized swelling, mass and lump, head: Secondary | ICD-10-CM | POA: Insufficient documentation

## 2014-02-21 DIAGNOSIS — R262 Difficulty in walking, not elsewhere classified: Secondary | ICD-10-CM | POA: Insufficient documentation

## 2014-02-21 DIAGNOSIS — M545 Low back pain: Secondary | ICD-10-CM | POA: Insufficient documentation

## 2014-02-21 DIAGNOSIS — I509 Heart failure, unspecified: Secondary | ICD-10-CM | POA: Insufficient documentation

## 2014-02-21 DIAGNOSIS — M069 Rheumatoid arthritis, unspecified: Secondary | ICD-10-CM | POA: Insufficient documentation

## 2014-02-21 DIAGNOSIS — E1140 Type 2 diabetes mellitus with diabetic neuropathy, unspecified: Secondary | ICD-10-CM | POA: Insufficient documentation

## 2014-02-21 DIAGNOSIS — M4806 Spinal stenosis, lumbar region: Secondary | ICD-10-CM | POA: Insufficient documentation

## 2014-02-22 ENCOUNTER — Ambulatory Visit: Payer: Medicare Other | Admitting: Rehabilitation

## 2014-02-22 DIAGNOSIS — M545 Low back pain: Secondary | ICD-10-CM | POA: Diagnosis not present

## 2014-02-22 DIAGNOSIS — M25552 Pain in left hip: Secondary | ICD-10-CM | POA: Diagnosis not present

## 2014-02-22 DIAGNOSIS — Z5189 Encounter for other specified aftercare: Secondary | ICD-10-CM | POA: Diagnosis not present

## 2014-02-22 DIAGNOSIS — R262 Difficulty in walking, not elsewhere classified: Secondary | ICD-10-CM | POA: Diagnosis not present

## 2014-02-23 ENCOUNTER — Encounter (HOSPITAL_COMMUNITY): Payer: Medicare Other

## 2014-02-27 ENCOUNTER — Telehealth: Payer: Self-pay | Admitting: Neurology

## 2014-02-27 ENCOUNTER — Ambulatory Visit: Payer: Medicare Other | Admitting: Physical Therapy

## 2014-02-27 NOTE — Telephone Encounter (Signed)
Spoke to patient regarding mri of the brain. Will fax MRi results ti her Neurosurgeon Dr, Salomon Fick at [facility]

## 2014-02-28 ENCOUNTER — Other Ambulatory Visit: Payer: Medicare Other

## 2014-02-28 ENCOUNTER — Encounter (HOSPITAL_COMMUNITY): Payer: Medicare Other

## 2014-03-01 ENCOUNTER — Telehealth: Payer: Self-pay | Admitting: Neurology

## 2014-03-01 NOTE — Telephone Encounter (Signed)
Diane from Dr. Othelia Pulling office calling to state that she received referral for patient but wants to know why she needs to be seen at their office, needs more information, please return call and advise.

## 2014-03-01 NOTE — Telephone Encounter (Signed)
Returned Diane's call. She says she is not showing that the patient is a patient of Dr. Salomon Fick office. I will speak w/ Dr. Jaynee Eagles or patient tomorrow.

## 2014-03-02 ENCOUNTER — Encounter (HOSPITAL_COMMUNITY): Payer: Medicare Other

## 2014-03-03 ENCOUNTER — Ambulatory Visit: Payer: Medicare Other | Admitting: Physical Therapy

## 2014-03-03 DIAGNOSIS — Z5189 Encounter for other specified aftercare: Secondary | ICD-10-CM | POA: Diagnosis not present

## 2014-03-03 DIAGNOSIS — M545 Low back pain: Secondary | ICD-10-CM | POA: Diagnosis not present

## 2014-03-03 DIAGNOSIS — M25552 Pain in left hip: Secondary | ICD-10-CM | POA: Diagnosis not present

## 2014-03-03 DIAGNOSIS — R262 Difficulty in walking, not elsewhere classified: Secondary | ICD-10-CM | POA: Diagnosis not present

## 2014-03-06 ENCOUNTER — Ambulatory Visit: Payer: Medicare Other | Admitting: Physical Therapy

## 2014-03-06 DIAGNOSIS — R300 Dysuria: Secondary | ICD-10-CM | POA: Diagnosis not present

## 2014-03-06 DIAGNOSIS — Z79899 Other long term (current) drug therapy: Secondary | ICD-10-CM | POA: Diagnosis not present

## 2014-03-06 NOTE — Telephone Encounter (Signed)
Diane calling again to check on the status of this message, please return call and advise.

## 2014-03-06 NOTE — Telephone Encounter (Signed)
Spoke to patient. She says Dr. Marygrace Drought office has her maiden name "Wallie Char". She has an appt scheduled with them on 03/16/14. Called Diane and gave her the info. She said she would attach the notes for patient's upcoming visit.

## 2014-03-07 ENCOUNTER — Encounter (HOSPITAL_COMMUNITY): Payer: Medicare Other

## 2014-03-08 ENCOUNTER — Ambulatory Visit: Payer: Medicare Other | Admitting: Rehabilitation

## 2014-03-08 DIAGNOSIS — Z5189 Encounter for other specified aftercare: Secondary | ICD-10-CM | POA: Diagnosis not present

## 2014-03-08 DIAGNOSIS — M25552 Pain in left hip: Secondary | ICD-10-CM | POA: Diagnosis not present

## 2014-03-08 DIAGNOSIS — R262 Difficulty in walking, not elsewhere classified: Secondary | ICD-10-CM | POA: Diagnosis not present

## 2014-03-08 DIAGNOSIS — M545 Low back pain: Secondary | ICD-10-CM | POA: Diagnosis not present

## 2014-03-10 ENCOUNTER — Telehealth: Payer: Self-pay | Admitting: *Deleted

## 2014-03-10 NOTE — Telephone Encounter (Signed)
[name] called stating that she failed to get her tramadol rx refilled. She attempted to get the refill 01/26/2014, 3 days after the Rx expired....Marland KitchenMarland KitchenI called her pharmacy and had them renew the prescription. I attempted to contact her. She did not answer and I could not leave a message. Please advise Pt. When she calls back.Marland KitchenMarland KitchenI reached the patient and informed her that the prescription is being filled

## 2014-03-14 ENCOUNTER — Ambulatory Visit: Payer: Medicare Other | Admitting: Physical Therapy

## 2014-03-14 DIAGNOSIS — M25552 Pain in left hip: Secondary | ICD-10-CM | POA: Diagnosis not present

## 2014-03-14 DIAGNOSIS — Z5189 Encounter for other specified aftercare: Secondary | ICD-10-CM | POA: Diagnosis not present

## 2014-03-14 DIAGNOSIS — R262 Difficulty in walking, not elsewhere classified: Secondary | ICD-10-CM | POA: Diagnosis not present

## 2014-03-14 DIAGNOSIS — M545 Low back pain: Secondary | ICD-10-CM | POA: Diagnosis not present

## 2014-03-16 ENCOUNTER — Encounter: Payer: Medicare Other | Admitting: Physical Therapy

## 2014-03-16 ENCOUNTER — Other Ambulatory Visit: Payer: Self-pay | Admitting: *Deleted

## 2014-03-16 DIAGNOSIS — G939 Disorder of brain, unspecified: Secondary | ICD-10-CM | POA: Diagnosis not present

## 2014-03-16 DIAGNOSIS — D320 Benign neoplasm of cerebral meninges: Secondary | ICD-10-CM | POA: Diagnosis not present

## 2014-03-16 DIAGNOSIS — Z9889 Other specified postprocedural states: Secondary | ICD-10-CM | POA: Diagnosis not present

## 2014-03-16 DIAGNOSIS — D329 Benign neoplasm of meninges, unspecified: Secondary | ICD-10-CM | POA: Diagnosis not present

## 2014-03-22 ENCOUNTER — Encounter: Payer: Self-pay | Admitting: Physical Medicine & Rehabilitation

## 2014-03-22 ENCOUNTER — Encounter: Payer: Medicare Other | Attending: Physical Medicine & Rehabilitation | Admitting: Physical Medicine & Rehabilitation

## 2014-03-22 ENCOUNTER — Other Ambulatory Visit: Payer: Self-pay | Admitting: Physical Medicine & Rehabilitation

## 2014-03-22 VITALS — BP 120/68 | HR 96 | Resp 14 | Ht 62.0 in | Wt 285.0 lb

## 2014-03-22 DIAGNOSIS — M47816 Spondylosis without myelopathy or radiculopathy, lumbar region: Secondary | ICD-10-CM

## 2014-03-22 DIAGNOSIS — Z5181 Encounter for therapeutic drug level monitoring: Secondary | ICD-10-CM | POA: Insufficient documentation

## 2014-03-22 DIAGNOSIS — G894 Chronic pain syndrome: Secondary | ICD-10-CM | POA: Insufficient documentation

## 2014-03-22 DIAGNOSIS — Z79899 Other long term (current) drug therapy: Secondary | ICD-10-CM | POA: Insufficient documentation

## 2014-03-22 DIAGNOSIS — M48061 Spinal stenosis, lumbar region without neurogenic claudication: Secondary | ICD-10-CM

## 2014-03-22 DIAGNOSIS — M7062 Trochanteric bursitis, left hip: Secondary | ICD-10-CM | POA: Diagnosis not present

## 2014-03-22 DIAGNOSIS — E1140 Type 2 diabetes mellitus with diabetic neuropathy, unspecified: Secondary | ICD-10-CM

## 2014-03-22 DIAGNOSIS — M4806 Spinal stenosis, lumbar region: Secondary | ICD-10-CM | POA: Diagnosis not present

## 2014-03-22 DIAGNOSIS — R0600 Dyspnea, unspecified: Secondary | ICD-10-CM

## 2014-03-22 DIAGNOSIS — M5416 Radiculopathy, lumbar region: Secondary | ICD-10-CM | POA: Insufficient documentation

## 2014-03-22 DIAGNOSIS — M069 Rheumatoid arthritis, unspecified: Secondary | ICD-10-CM | POA: Insufficient documentation

## 2014-03-22 MED ORDER — DULOXETINE HCL 60 MG PO CPEP: 120.0000 mg | ORAL_CAPSULE | Freq: Every day | ORAL | Status: DC

## 2014-03-22 MED ORDER — TRAMADOL HCL 50 MG PO TABS: 50.0000 mg | ORAL_TABLET | ORAL | Status: DC | PRN

## 2014-03-22 NOTE — Patient Instructions (Signed)
Continue to work on [employer] and regular exercise. Stretch your hamstrings!!

## 2014-03-22 NOTE — Progress Notes (Signed)
Subjective:    Patient ID: [name], female    DOB: 1940-07-16, 73 y.o.   MRN: [id number]  HPI   Mrs. Portugal is back regarding her chronic pain. She has really felt that the therapy has helped her back. She is having some tightness in her low back and upper thigh. It will occasionally spasm, especially when she bends over.   She takes tramadol for breakthrough pain and is rarely using her morphine at this point.  She is still having some general fatigue.  Her pulmonary condition is generally and she remains on 2L of oxygen.    Pain Inventory Average Pain 5 Pain Right Now 3 My pain is intermittent, sharp and tingling  In the last 24 hours, has pain interfered with the following? General activity 5 Relation with others 5 Enjoyment of life 5 What TIME of day is your pain at its worst? evening Sleep (in general) Poor  Pain is worse with: walking, standing and some activites Pain improves with: rest and medication Relief from Meds: 6  Mobility walk with assistance use a walker ability to climb steps?  no do you drive?  yes  Function disabled: date disabled .  Neuro/Psych bladder control problems weakness numbness tingling trouble walking spasms dizziness anxiety  Prior Studies Any changes since last visit?  no  Physicians involved in your care Any changes since last visit?  no   Family History  Problem Relation Age of Onset  . Diabetes Mother   . Heart attack Mother   . Hypertension Father   . Lung cancer Father   . Diabetes Sister   . Diabetes Brother   . Hypertension Brother    History   Social History  . Marital Status: Married    Spouse Name: N/A    Number of Children: 6  . Years of Education: college   Occupational History  . retired    Social History Main Topics  . Smoking status: Never Smoker   . Smokeless tobacco: Never Used  . Alcohol Use: No  . Drug Use: No  . Sexual Activity: Not Currently   Other Topics Concern  .  None   Social History Narrative   Patient consumes 2-3 cups coffee per day.   Past Surgical History  Procedure Laterality Date  . Abdominal hysterectomy    . Ovary surgery    . Shoulder surgery    . Brain surgery      Gamma knife 10/13. Needs repeat spring  '14  . Video bronchoscopy Bilateral 05/31/2013    Procedure: VIDEO BRONCHOSCOPY WITHOUT FLUORO;  Surgeon: Brand Males, MD;  Location: [facility];  Service: Cardiopulmonary;  Laterality: Bilateral;   Past Medical History  Diagnosis Date  . Diabetes mellitus   . Hypertension   . Thyroid disease   . Arthritis     endstage changes bilateral knees/bilateral ankles.   . High cholesterol   . 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 02/17/2012    Continue diplopia, left eye pain and left headaches.    . Depression, reactive   . RA (rheumatoid arthritis)     has been off methotreaxte since 10/13.  . Contusion of left knee     due to fall 1/14.  [name] URI (upper respiratory infection)   . Clotting disorder   . Asthma   . Carotid artery occlusion   . Interstitial lung disease   . Morbid obesity   . Shortness of breath   . Family history  of heart disease   . Chronic kidney disease    BP 120/68 mmHg  Pulse 96  Resp 14  Ht _0  (1.575 m)  Wt 285 lb (129.275 kg)  BMI 52.11 kg/m2  SpO2 97%  Opioid Risk Score:   Fall Risk Score: Moderate Fall Risk (6-13 points)  Review of Systems     Objective:   Physical Exam  General: Alert and oriented x 3, No apparent distress  HEENT: Head is normocephalic, atraumatic, PERRLA, EOMI, sclera anicteric, oral mucosa pink and moist, dentition intact, ext ear canals clear,  Neck: Supple without JVD or lymphadenopathy  Heart: Reg rate and rhythm. No murmurs rubs or gallops  Chest: sob with gait, sometimes at rest. Phonates well  Abdomen: Soft, non-tender, non-distended, bowel sounds positive.  Extremities: No clubbing, cyanosis, 2+ edema. Pulses are 2+  Skin: Clean  and intact without signs of breakdown  Neuro: Pt is cognitively appropriate with normal insight, memory, and awareness. Cranial nerves 2-12 are intact. Sensory exam is near normal in the legs... Reflexes are 2+ in all 4's. Fine motor coordination is intact. No tremors. Motor function is grossly 4 to 5/5 with pain inhbition. Musculoskeletal: low back and left hip still tender.  She is slightly elevated in the left pelvis. She is antalgic with gait on the left. Crepitus in both knees. No gross knee instability. She stood easier today. She can bend to about 60 degrees in the waist. Her hamstrings are tight bilaterally, left more than right. Pain is more prominent at the IT on the left.  Psych: Pt's affect is appropriate. Pt is cooperative   Assessment & Plan:   Assessment:  1. Lumbar spinal stenosis with neurogenic claudication. Associated facet arthropathy  2. Depression.  3. Diabetes mellitus type 2 with polyneuropathy  4. Rheumatoid arthritis. Multiple joint involvement including knees.  5. Left greater troch bursitis  6. Interstitial lung disease    Plan:  1. Continue with [facility] for pulmonary rehab  2. Continue PT for facet related issues as well. We discussed hamstring stretching.  3. MSO4 for breakthrough pain, one q8 prn #75---none needed today. She is requiring less -continue tramadol for less severe pain up to 3-4x per day (did not need refills)  4. She understands the importance of weight loss in managing her pain, bp, and CV issues.  5. Continue cymbalta to 61m bid for neuropathic and axial spine pain. Refill today 6. Follow up with me in 3 months. 30 minutes of face to face patient care time were spent during this visit. All questions were encouraged and answered.

## 2014-03-23 ENCOUNTER — Ambulatory Visit: Payer: Medicare Other | Attending: Physical Medicine & Rehabilitation | Admitting: Physical Therapy

## 2014-03-23 ENCOUNTER — Telehealth: Payer: Self-pay | Admitting: *Deleted

## 2014-03-23 ENCOUNTER — Encounter: Payer: Self-pay | Admitting: Physical Therapy

## 2014-03-23 DIAGNOSIS — R262 Difficulty in walking, not elsewhere classified: Secondary | ICD-10-CM | POA: Diagnosis not present

## 2014-03-23 DIAGNOSIS — Z5189 Encounter for other specified aftercare: Secondary | ICD-10-CM | POA: Insufficient documentation

## 2014-03-23 DIAGNOSIS — M545 Low back pain: Secondary | ICD-10-CM | POA: Diagnosis not present

## 2014-03-23 DIAGNOSIS — M25552 Pain in left hip: Secondary | ICD-10-CM | POA: Diagnosis not present

## 2014-03-23 DIAGNOSIS — M47816 Spondylosis without myelopathy or radiculopathy, lumbar region: Secondary | ICD-10-CM

## 2014-03-23 DIAGNOSIS — M48061 Spinal stenosis, lumbar region without neurogenic claudication: Secondary | ICD-10-CM

## 2014-03-23 NOTE — Patient Instructions (Addendum)
.  Knee Extension (Hamstring Stretch) With Pelvis Neutral   Slowly and gently bring foot onto the ground heel touching the floor.  Lift toes and lean forward at the waist.  Can also prop foot on stool.  Be sure pelvis does not tip backward (flex) or rotate. Hold __30_ seconds. Do ___3 times, each leg, ___2times per day.  http://ss.exer.us/89   Copyright  VHI. All rights reserved.

## 2014-03-23 NOTE — Telephone Encounter (Signed)
appts made and printed...td 

## 2014-03-23 NOTE — Therapy (Signed)
Physical Therapy Treatment  Patient Details  Name: [name] MRN: [id number] Date of Birth: 1940-12-22  Encounter Date: 03/23/2014      PT End of Session - 03/23/14 1253    Visit Number 9   Number of Visits 12   Date for PT Re-Evaluation 03/23/14   PT Start Time 1025   PT Stop Time 1110   PT Time Calculation (min) 45 min   Activity Tolerance Patient limited by fatigue      Past Medical History  Diagnosis Date  . Diabetes mellitus   . Hypertension   . Thyroid disease   . Arthritis     endstage changes bilateral knees/bilateral ankles.   . High cholesterol   . 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 02/17/2012    Continue diplopia, left eye pain and left headaches.    . Depression, reactive   . RA (rheumatoid arthritis)     has been off methotreaxte since 10/13.  . Contusion of left knee     due to fall 1/14.  [name] URI (upper respiratory infection)   . Clotting disorder   . Asthma   . Carotid artery occlusion   . Interstitial lung disease   . Morbid obesity   . Shortness of breath   . Family history of heart disease   . Chronic kidney disease     Past Surgical History  Procedure Laterality Date  . Abdominal hysterectomy    . Ovary surgery    . Shoulder surgery    . Brain surgery      Gamma knife 10/13. Needs repeat spring  '14  . Video bronchoscopy Bilateral 05/31/2013    Procedure: VIDEO BRONCHOSCOPY WITHOUT FLUORO;  Surgeon: Brand Males, MD;  Location: [facility];  Service: Cardiopulmonary;  Laterality: Bilateral;    There were no vitals taken for this visit.  Visit Diagnosis:  Spinal stenosis of lumbar region  Lumbar facet arthropathy      Subjective Assessment - 03/23/14 1033    Symptoms No complaint of pain today.  Has occasional spasms in each hamstrings.  MD was pleased with progress.     Limitations Standing;House hold activities;Walking   How long can you stand comfortably? 5-10 min   How long can you walk  comfortably? 10 min without O2   Currently in Pain? No/denies   Multiple Pain Sites No          [facility] PT Assessment - 03/23/14 1248    AROM   Overall AROM Comments --  Extension limited by >50 % with pain   Lumbar Flexion 25% limited (60-70 deg)   Lumbar Extension 50%  with pain   Lumbar - Right Side Bend WNL pain on Lt.   Lumbar - Left Side Bend WNL pain on Lt.   Lumbar - Right Rotation 50% no pain   Lumbar - Left Rotation 50% no pain   Strength   Right Hip Flexion 4/5   Right Hip ABduction 3-/5   Left Hip Flexion 3-/5   Left Hip ABduction 2+/5   Right Knee Flexion 4/5   Right Knee Extension 4/5   Left Knee Flexion 4/5   Left Knee Extension 4/5          [facility] Adult PT Treatment/Exercise - 03/23/14 1043    Lumbar Exercises: Machines for Strengthening   Other Lumbar Machine Exercise --  NuStep 10 min level 2   Lumbar Exercises: Supine   Clam 10 reps;2 seconds  unilat, bilat  Bent Knee Raise 10 reps;2 seconds  each LE   Straight Leg Raise 10 reps;2 seconds  each LE   Other Supine Lumbar Exercises --  lower trunk rotation x5   Knee/Hip Exercises: Stretches   Active Hamstring Stretch 3 reps;30 seconds  each leg   Active Hamstring Stretch Limitations --  done in sitting as well x2 each LE          Education - 03/23/14 1253    Education provided Yes   Education Details HEP, cont. PT   Education Details Patient   Methods Explanation;Demonstration;Handout   Comprehension Verbalized understanding;Returned demonstration          PT Short Term Goals - 03/23/14 1048    PT SHORT TERM GOAL #1   Title complete 10 min of Cardio with normal Sa O2   Baseline done today but pulse ox not working   Time 2   Period Weeks   Status On-going          PT Long Term Goals - 03/23/14 1302    PT LONG TERM GOAL #1   Title Pt. will demo/verbalize techniques to reduce risk of re-injury related to posture, body mechanics.   Time 6   Period Weeks   Status On-going   PT  LONG TERM GOAL #2   Title Pt will be I with advanced HEP   Time 6   Period Weeks   Status On-going   PT LONG TERM GOAL #3   Title Patient will be able to stand to do light housework for 15 min without rest breaks   Time 6   Period Weeks   Status On-going   PT LONG TERM GOAL #4   Title Pt. will demo Berg score 53/56 to demo decreased balance   Time 6   Period Weeks   Status On-going   PT LONG TERM GOAL #5   Title Pt. will be able to walk on a short community errand with device and report mod fatigue overall   Time 6   Period Weeks   Status On-going          Plan - 03/23/14 1254    Clinical Impression Statement Pt. is limited by LE and core weakness, endurance and pain with changing positions on the mat.  She has reduced her pain overall and is making strides in endurance.  No increased pain after session, just fatiigue.    Pt will benefit from skilled therapeutic intervention in order to improve on the following deficits Cardiopulmonary status limiting activity;Decreased mobility;Decreased strength;Decreased range of motion;Decreased activity tolerance;Pain;Decreased balance   Rehab Potential Excellent   PT Frequency Min 2X/week   PT Duration 6 weeks   PT Treatment/Interventions Therapeutic exercise;Manual techniques;Balance training;Stair training;Neuromuscular re-education;Modalities;Functional mobility training;Therapeutic activities;Patient/family education   PT Plan Renewed POC, advance as tolerated and manage pain as needed.         Problem List Patient Active Problem List   Diagnosis Date Noted  . Aphasia 02/12/2014  . 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 01/18/2014  . Lumbar facet arthropathy 01/18/2014  . Dyspnea 01/12/2014  . Acute bronchitis 07/24/2013  . Bilateral edema of lower extremity 05/30/2013  . Chronic cough 05/24/2013  . ILD (interstitial lung disease) 04/10/2013  . Greater trochanteric bursitis of left hip 08/13/2012  . Spinal  stenosis of lumbar region with radiculopathy 07/12/2012  . Inability to walk 07/12/2012  . Acute lumbar back pain 07/12/2012  . Asymptomatic bacteriuria 07/12/2012  . Meningioma of left sphenoid  wing involving cavernous sinus 02/17/2012  . Brain mass 01/28/2012  . Chest pain 01/28/2012  . Rheumatoid arthritis 01/28/2012  . Osteoarthritis 01/28/2012  . High cholesterol   . Thyroid disease   . Hypertension                                             Maniyah Moller 03/23/2014, 1:05 PM

## 2014-03-27 DIAGNOSIS — Z79899 Other long term (current) drug therapy: Secondary | ICD-10-CM | POA: Diagnosis not present

## 2014-03-27 DIAGNOSIS — R6889 Other general symptoms and signs: Secondary | ICD-10-CM | POA: Diagnosis not present

## 2014-03-28 ENCOUNTER — Ambulatory Visit: Payer: Medicare Other | Admitting: Physical Therapy

## 2014-03-28 DIAGNOSIS — M48061 Spinal stenosis, lumbar region without neurogenic claudication: Secondary | ICD-10-CM

## 2014-03-28 DIAGNOSIS — M545 Low back pain: Secondary | ICD-10-CM | POA: Diagnosis not present

## 2014-03-28 DIAGNOSIS — R262 Difficulty in walking, not elsewhere classified: Secondary | ICD-10-CM | POA: Diagnosis not present

## 2014-03-28 DIAGNOSIS — Z5189 Encounter for other specified aftercare: Secondary | ICD-10-CM | POA: Diagnosis not present

## 2014-03-28 DIAGNOSIS — M25552 Pain in left hip: Secondary | ICD-10-CM | POA: Diagnosis not present

## 2014-03-28 NOTE — Therapy (Signed)
Physical Therapy Treatment  Patient Details  Name: [name] MRN: [id number] Date of Birth: [birth year]  Encounter Date: 03/28/2014      PT End of Session - 03/28/14 1430    Visit Number 10   Number of Visits 24   Date for PT Re-Evaluation 05/04/14   PT Start Time 1336   PT Stop Time 1421   PT Time Calculation (min) 45 min   Activity Tolerance Patient limited by fatigue;Patient limited by pain      Past Medical History  Diagnosis Date  . Diabetes mellitus   . Hypertension   . Thyroid disease   . Arthritis     endstage changes bilateral knees/bilateral ankles.   . High cholesterol   . 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 02/17/2012    Continue diplopia, left eye pain and left headaches.    . Depression, reactive   . RA (rheumatoid arthritis)     has been off methotreaxte since 10/13.  . Contusion of left knee     due to fall 1/14.  [name] URI (upper respiratory infection)   . Clotting disorder   . Asthma   . Carotid artery occlusion   . Interstitial lung disease   . Morbid obesity   . Shortness of breath   . Family history of heart disease   . Chronic kidney disease     Past Surgical History  Procedure Laterality Date  . Abdominal hysterectomy    . Ovary surgery    . Shoulder surgery    . Brain surgery      Gamma knife 10/13. Needs repeat spring  '14  . Video bronchoscopy Bilateral 05/31/2013    Procedure: VIDEO BRONCHOSCOPY WITHOUT FLUORO;  Surgeon: Brand Males, MD;  Location: [facility];  Service: Cardiopulmonary;  Laterality: Bilateral;    There were no vitals taken for this visit.  Visit Diagnosis:  Spinal stenosis of lumbar region      Subjective Assessment - 03/28/14 1339    Symptoms Feeling sick today, nausea, had chest pressure yesterday.  O2 was fine.  Sees cardiologist next month, will call to make appt. sooner.    Pertinent History Multiple medical issues, RA, COPD   Currently in Pain? Yes   Pain Score 3    Pain Location Leg   Pain Orientation Left   Pain Descriptors / Indicators Aching;Burning   Pain Type Chronic pain   Pain Radiating Towards towards lower leg   Pain Onset Other (comment)  increased over last 2 days   Pain Frequency Intermittent   Aggravating Factors  standing, walking   Pain Relieving Factors lying supine, sitting (pressure off low back)   Multiple Pain Sites No            [facility] Adult PT Treatment/Exercise - 03/28/14 0001    Lumbar Exercises: Supine   Bridge 10 reps;3 seconds   Other Supine Lumbar Exercises Decompression ex x5 each   Other Supine Lumbar Exercises Post. pelvis   Knee/Hip Exercises: Stretches   Active Hamstring Stretch 3 reps;30 seconds  seated   Modalities   Modalities Ultrasound   Ultrasound   Ultrasound Location --  Lt. L5 paraspinals and sup. gluteals   Ultrasound Parameters --  1.4 W/cm and 50% duty, 1.0 MHz   Ultrasound Goals Pain   Manual Therapy   Manual Therapy Massage   Massage to sup gluteal and L5 paraspinals          PT Education - 03/28/14 1429  Education provided Yes   Education Details ultrasound, cardiac symptoms that warrant call to MD   Person(s) Educated Patient   Methods Explanation   Comprehension Verbalized understanding          PT Short Term Goals - 03/28/14 1435    PT SHORT TERM GOAL #1   Title complete 10 min of Cardio with normal Sa O2   Status Achieved            Plan - 03/28/14 1433    Clinical Impression Statement Patient is limited by fatigue and nausea today.  Focused session on pain relief.    PT Plan assess [place] effect, progress ther ex as tolerated. DO FOTO and G CODES!!        Problem List Patient Active Problem List   Diagnosis Date Noted  . Aphasia 02/12/2014  . 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 01/18/2014  . Lumbar facet arthropathy 01/18/2014  . Dyspnea 01/12/2014  . Acute bronchitis 07/24/2013  . Bilateral edema of lower extremity 05/30/2013  .  Chronic cough 05/24/2013  . ILD (interstitial lung disease) 04/10/2013  . Greater trochanteric bursitis of left hip 08/13/2012  . Spinal stenosis of lumbar region with radiculopathy 07/12/2012  . Inability to walk 07/12/2012  . Acute lumbar back pain 07/12/2012  . Asymptomatic bacteriuria 07/12/2012  . 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 Brain mass 01/28/2012  . Chest pain 01/28/2012  . Rheumatoid arthritis 01/28/2012  . Osteoarthritis 01/28/2012  . High cholesterol   . Thyroid disease   . Hypertension                                               Laurieann Friddle 03/28/2014, 2:39 PM

## 2014-03-30 ENCOUNTER — Encounter: Payer: Self-pay | Admitting: Physical Therapy

## 2014-03-30 ENCOUNTER — Ambulatory Visit: Payer: Medicare Other | Admitting: Physical Therapy

## 2014-03-30 DIAGNOSIS — M25552 Pain in left hip: Secondary | ICD-10-CM | POA: Diagnosis not present

## 2014-03-30 DIAGNOSIS — M48061 Spinal stenosis, lumbar region without neurogenic claudication: Secondary | ICD-10-CM

## 2014-03-30 DIAGNOSIS — M47816 Spondylosis without myelopathy or radiculopathy, lumbar region: Secondary | ICD-10-CM

## 2014-03-30 DIAGNOSIS — R262 Difficulty in walking, not elsewhere classified: Secondary | ICD-10-CM | POA: Diagnosis not present

## 2014-03-30 DIAGNOSIS — M545 Low back pain: Secondary | ICD-10-CM | POA: Diagnosis not present

## 2014-03-30 DIAGNOSIS — Z5189 Encounter for other specified aftercare: Secondary | ICD-10-CM | POA: Diagnosis not present

## 2014-03-30 NOTE — Therapy (Signed)
Physical Therapy Treatment  Patient Details  Name: [name] MRN: [id number] Date of Birth: Nov 04, 1940  Encounter Date: 03/30/2014      PT End of Session - 03/30/14 1157    Visit Number 11   Number of Visits 24   PT Start Time 6301   PT Stop Time 1150   PT Time Calculation (min) 45 min   Activity Tolerance Patient limited by fatigue      Past Medical History  Diagnosis Date  . Diabetes mellitus   . Hypertension   . Thyroid disease   . Arthritis     endstage changes bilateral knees/bilateral ankles.   . High cholesterol   . 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 02/17/2012    Continue diplopia, left eye pain and left headaches.    . Depression, reactive   . RA (rheumatoid arthritis)     has been off methotreaxte since 10/13.  . Contusion of left knee     due to fall 1/14.  [name] URI (upper respiratory infection)   . Clotting disorder   . Asthma   . Carotid artery occlusion   . Interstitial lung disease   . Morbid obesity   . Shortness of breath   . Family history of heart disease   . Chronic kidney disease     Past Surgical History  Procedure Laterality Date  . Abdominal hysterectomy    . Ovary surgery    . Shoulder surgery    . Brain surgery      Gamma knife 10/13. Needs repeat spring  '14  . Video bronchoscopy Bilateral 05/31/2013    Procedure: VIDEO BRONCHOSCOPY WITHOUT FLUORO;  Surgeon: Brand Males, MD;  Location: [facility];  Service: Cardiopulmonary;  Laterality: Bilateral;    There were no vitals taken for this visit.  Visit Diagnosis:  Spinal stenosis of lumbar region  Lumbar facet arthropathy      Subjective Assessment - 03/30/14 1105    Symptoms [place] really helped. Has a littel bit of pain today.   Currently in Pain? Yes   Pain Score 2   was higher this AM   Pain Location Back   Pain Orientation Left   Pain Descriptors / Indicators Aching   Pain Type Chronic pain   Pain Onset More than a month ago   Pain  Frequency Intermittent   Multiple Pain Sites No            [facility] Adult PT Treatment/Exercise - 03/30/14 1116    Lumbar Exercises: Supine   Ab Set 10 reps;5 seconds  ball squeeze   Clam 20 reps   Bent Knee Raise 10 reps   Straight Leg Raise 10 reps  Needed rest break after 5 reps on Lt. side   Other Supine Lumbar Exercises --  post pelvic tilt x10   Lumbar Exercises: Sidelying   Other Sidelying Lumbar Exercises --  Sidelying trunk stretch with pillow under waist to Lt. side   Modalities   Modalities Ultrasound   Ultrasound   Ultrasound Location --  Lt LS spine   Ultrasound Parameters --  50% duty, 1.4 W/cm2 and 1.0MHz   Ultrasound Goals Pain                Plan - 03/30/14 1158    Clinical Impression Statement Patient is able to report less pain overall and has improved strength in LEs as demo'd by ther ex observation.  She is still limited by endurance primarily.  PT Plan Progress as tolerated, try more standing ther ex.          G-Codes - Apr 03, 2014 1201    Functional Assessment Tool Used FOTO   Functional Limitation Mobility: Walking and moving around   Mobility: Walking and Moving Around Current Status [phone]) At least 60 percent but less than 80 percent impaired, limited or restricted   Mobility: Walking and Moving Around Goal Status [phone]) At least 40 percent but less than 60 percent impaired, limited or restricted      Problem List Patient Active Problem List   Diagnosis Date Noted  . Aphasia 02/12/2014  . 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 01/18/2014  . Lumbar facet arthropathy 01/18/2014  . Dyspnea 01/12/2014  . Acute bronchitis 07/24/2013  . Bilateral edema of lower extremity 05/30/2013  . Chronic cough 05/24/2013  . ILD (interstitial lung disease) 04/10/2013  . Greater trochanteric bursitis of left hip 08/13/2012  . Spinal stenosis of lumbar region with radiculopathy 07/12/2012  . Inability to walk 07/12/2012  . Acute  lumbar back pain 07/12/2012  . Asymptomatic bacteriuria 07/12/2012  . 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 Brain mass 01/28/2012  . Chest pain 01/28/2012  . Rheumatoid arthritis 01/28/2012  . Osteoarthritis 01/28/2012  . High cholesterol   . Thyroid disease   . Hypertension                                               PAA,JENNIFER Apr 03, 2014, 12:04 PM

## 2014-04-03 ENCOUNTER — Ambulatory Visit: Payer: Medicare Other | Admitting: Rehabilitation

## 2014-04-03 DIAGNOSIS — R262 Difficulty in walking, not elsewhere classified: Secondary | ICD-10-CM | POA: Diagnosis not present

## 2014-04-03 DIAGNOSIS — M545 Low back pain: Secondary | ICD-10-CM | POA: Diagnosis not present

## 2014-04-03 DIAGNOSIS — Z5189 Encounter for other specified aftercare: Secondary | ICD-10-CM | POA: Diagnosis not present

## 2014-04-03 DIAGNOSIS — M47816 Spondylosis without myelopathy or radiculopathy, lumbar region: Secondary | ICD-10-CM

## 2014-04-03 DIAGNOSIS — M25552 Pain in left hip: Secondary | ICD-10-CM | POA: Diagnosis not present

## 2014-04-03 DIAGNOSIS — M48061 Spinal stenosis, lumbar region without neurogenic claudication: Secondary | ICD-10-CM

## 2014-04-03 NOTE — Therapy (Signed)
Physical Therapy Treatment  Patient Details  Name: [name] MRN: [id number] Date of Birth: 1941/03/05  Encounter Date: 04/03/2014      PT End of Session - 04/03/14 1430    Visit Number 12   Number of Visits 24   Date for PT Re-Evaluation 05/04/14   PT Start Time 0225   PT Stop Time 0310   PT Time Calculation (min) 45 min   Activity Tolerance Patient limited by fatigue      Past Medical History  Diagnosis Date  . Diabetes mellitus   . Hypertension   . Thyroid disease   . Arthritis     endstage changes bilateral knees/bilateral ankles.   . High cholesterol   . 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 02/17/2012    Continue diplopia, left eye pain and left headaches.    . Depression, reactive   . RA (rheumatoid arthritis)     has been off methotreaxte since 10/13.  . Contusion of left knee     due to fall 1/14.  [name] URI (upper respiratory infection)   . Clotting disorder   . Asthma   . Carotid artery occlusion   . Interstitial lung disease   . Morbid obesity   . Shortness of breath   . Family history of heart disease   . Chronic kidney disease     Past Surgical History  Procedure Laterality Date  . Abdominal hysterectomy    . Ovary surgery    . Shoulder surgery    . Brain surgery      Gamma knife 10/13. Needs repeat spring  '14  . Video bronchoscopy Bilateral 05/31/2013    Procedure: VIDEO BRONCHOSCOPY WITHOUT FLUORO;  Surgeon: Brand Males, MD;  Location: [facility];  Service: Cardiopulmonary;  Laterality: Bilateral;    There were no vitals taken for this visit.  Visit Diagnosis:  Lumbar facet arthropathy  Spinal stenosis of lumbar region      Subjective Assessment - 04/03/14 1427    Symptoms My joints are aching   Currently in Pain? Yes   Pain Score 4    Pain Location --  Right knee and ankle to heel   Aggravating Factors  standing, walking   Pain Relieving Factors pain meds   Multiple Pain Sites Yes   Pain Score 7    Pain Location Back   Pain Descriptors / Indicators Constant;Throbbing   Pain Frequency Intermittent            [facility] Adult PT Treatment/Exercise - 04/03/14 1445    Exercises   Exercises --  Nustep Level 4 10 minutes UE/LE   Knee/Hip Exercises: Standing   SLS with Vectors --  SLS with UE support on chair 3 way hip   Other Standing Knee Exercises --  Sit-stand x 10   Balance Exercises   SLS Eyes open;3 reps;10 secs  bilateral with 2 finger support   Tandem Stance --  10-20 seconds with each foot forward, no UE support              PT Long Term Goals - 04/03/14 1442    PT LONG TERM GOAL #1   Title Pt. will demo/verbalize techniques to reduce risk of re-injury related to posture, body mechanics.   Status Achieved   PT LONG TERM GOAL #3   Title Patient will be able to stand to do light housework for 15 min without rest breaks   Status Achieved  Plan - 04/03/14 1439    Clinical Impression Statement patient reports she cooked breakfast this morning for the first time in 2 years, she was able to remain standing for 10-15 minutes without increased pain. She also reports she dust mopped her bedroom  using good body mechanics which helped keep her pain down.    PT Plan Progress as tolerated, try more standing ther ex.        Problem List Patient Active Problem List   Diagnosis Date Noted  . Aphasia 02/12/2014  . 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 01/18/2014  . Lumbar facet arthropathy 01/18/2014  . Dyspnea 01/12/2014  . Acute bronchitis 07/24/2013  . Bilateral edema of lower extremity 05/30/2013  . Chronic cough 05/24/2013  . ILD (interstitial lung disease) 04/10/2013  . Greater trochanteric bursitis of left hip 08/13/2012  . Spinal stenosis of lumbar region with radiculopathy 07/12/2012  . Inability to walk 07/12/2012  . Acute lumbar back pain 07/12/2012  . Asymptomatic bacteriuria 07/12/2012  . Meningioma of left sphenoid wing  involving cavernous sinus 02/17/2012  . Brain mass 01/28/2012  . Chest pain 01/28/2012  . Rheumatoid arthritis 01/28/2012  . Osteoarthritis 01/28/2012  . High cholesterol   . Thyroid disease   . Hypertension                                               Dorene Ar, PTA 04/03/2014, 2:59 PM

## 2014-04-05 DIAGNOSIS — N183 Chronic kidney disease, stage 3 (moderate): Secondary | ICD-10-CM | POA: Diagnosis not present

## 2014-04-05 DIAGNOSIS — I129 Hypertensive chronic kidney disease with stage 1 through stage 4 chronic kidney disease, or unspecified chronic kidney disease: Secondary | ICD-10-CM | POA: Diagnosis not present

## 2014-04-05 DIAGNOSIS — E559 Vitamin D deficiency, unspecified: Secondary | ICD-10-CM | POA: Diagnosis not present

## 2014-04-05 DIAGNOSIS — E1121 Type 2 diabetes mellitus with diabetic nephropathy: Secondary | ICD-10-CM | POA: Diagnosis not present

## 2014-04-05 DIAGNOSIS — J849 Interstitial pulmonary disease, unspecified: Secondary | ICD-10-CM | POA: Diagnosis not present

## 2014-04-06 ENCOUNTER — Ambulatory Visit: Payer: Medicare Other | Admitting: Rehabilitation

## 2014-04-06 DIAGNOSIS — Z5189 Encounter for other specified aftercare: Secondary | ICD-10-CM | POA: Diagnosis not present

## 2014-04-06 DIAGNOSIS — M48061 Spinal stenosis, lumbar region without neurogenic claudication: Secondary | ICD-10-CM

## 2014-04-06 DIAGNOSIS — R262 Difficulty in walking, not elsewhere classified: Secondary | ICD-10-CM | POA: Diagnosis not present

## 2014-04-06 DIAGNOSIS — M25552 Pain in left hip: Secondary | ICD-10-CM | POA: Diagnosis not present

## 2014-04-06 DIAGNOSIS — M545 Low back pain: Secondary | ICD-10-CM | POA: Diagnosis not present

## 2014-04-06 DIAGNOSIS — M47816 Spondylosis without myelopathy or radiculopathy, lumbar region: Secondary | ICD-10-CM

## 2014-04-06 NOTE — Patient Instructions (Signed)
Hip (Front)   Begin sitting tall, both feet flat on floor. Inhale, then exhale while lifting knee as high as is comfortable, keeping upper body straight and still. Slowly return to starting position. Repeat __10__ times each leg. Do _1-2___ sets per session. Do __1-2_ sessions per day. Do with green band around thighs. ABDUCTION: Sitting - Resistance Band (Active)   Sit with feet flat. Lift right leg slightly and, against green  resistance band, draw one or both out to the side Complete _1-2__ sets of __10_ repetitions. Perform _1-2_ sessions per day.  Copyright  VHI. All rights reserved.    Copyright  VHI. All rights reserved.

## 2014-04-06 NOTE — Therapy (Signed)
Physical Therapy Treatment  Patient Details  Name: [name] MRN: [id number] Date of Birth: 1940/10/30  Encounter Date: 04/06/2014      PT End of Session - 04/06/14 1429    Visit Number 13   Number of Visits 24   Date for PT Re-Evaluation 05/04/14   PT Start Time 0225   PT Stop Time 0255   PT Time Calculation (min) 30 min      Past Medical History  Diagnosis Date  . Diabetes mellitus   . Hypertension   . Thyroid disease   . Arthritis     endstage changes bilateral knees/bilateral ankles.   . High cholesterol   . 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 02/17/2012    Continue diplopia, left eye pain and left headaches.    . Depression, reactive   . RA (rheumatoid arthritis)     has been off methotreaxte since 10/13.  . Contusion of left knee     due to fall 1/14.  [name] URI (upper respiratory infection)   . Clotting disorder   . Asthma   . Carotid artery occlusion   . Interstitial lung disease   . Morbid obesity   . Shortness of breath   . Family history of heart disease   . Chronic kidney disease     Past Surgical History  Procedure Laterality Date  . Abdominal hysterectomy    . Ovary surgery    . Shoulder surgery    . Brain surgery      Gamma knife 10/13. Needs repeat spring  '14  . Video bronchoscopy Bilateral 05/31/2013    Procedure: VIDEO BRONCHOSCOPY WITHOUT FLUORO;  Surgeon: Brand Males, MD;  Location: [facility];  Service: Cardiopulmonary;  Laterality: Bilateral;    There were no vitals taken for this visit.  Visit Diagnosis:  Lumbar facet arthropathy  Spinal stenosis of lumbar region      Subjective Assessment - 04/06/14 1429    Symptoms no pain right now   Currently in Pain? No/denies            [facility] Adult PT Treatment/Exercise - 04/06/14 1436    Exercises   Exercises --   Knee/Hip Exercises: Standing   SLS with Vectors --  SLS with UE support on chair 3 way hip x 20 each   Other Standing Knee  Exercises --  Sit-stand x 14, increaed SOB do Dc   Knee/Hip Exercises: Seated   Other Seated Knee Exercises --  Greenband clams x15   Other Seated Knee Exercises --  Greenband around thighs, march x20   Balance Exercises: Standing   Tandem Stance --  10-20 seconds with each foot forward, no UE support   SLS Eyes open;3 reps;10 secs  bilateral with 2 finger support          PT Education - 04/06/14 1518    Education provided Yes   Person(s) Educated Patient   Methods Explanation;Handout   Comprehension Verbalized understanding;Returned demonstration              Plan - 04/06/14 1519    Clinical Impression Statement Patient is limited by fatigue today, request to end session early and declines Nustpe exercise today   PT Plan Progress as tolerated, try more standing ther ex. review seated theraband exercises        Problem List Patient Active Problem List   Diagnosis Date Noted  . Aphasia 02/12/2014  . 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 01/18/2014  .  Lumbar facet arthropathy 01/18/2014  . Dyspnea 01/12/2014  . Acute bronchitis 07/24/2013  . Bilateral edema of lower extremity 05/30/2013  . Chronic cough 05/24/2013  . ILD (interstitial lung disease) 04/10/2013  . Greater trochanteric bursitis of left hip 08/13/2012  . Spinal stenosis of lumbar region with radiculopathy 07/12/2012  . Inability to walk 07/12/2012  . Acute lumbar back pain 07/12/2012  . Asymptomatic bacteriuria 07/12/2012  . 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 Brain mass 01/28/2012  . Chest pain 01/28/2012  . Rheumatoid arthritis 01/28/2012  . Osteoarthritis 01/28/2012  . High cholesterol   . Thyroid disease   . Hypertension                                               Dorene Ar, PTA 04/06/2014, 3:21 PM

## 2014-04-07 DIAGNOSIS — R5381 Other malaise: Secondary | ICD-10-CM | POA: Diagnosis not present

## 2014-04-07 DIAGNOSIS — M1000 Idiopathic gout, unspecified site: Secondary | ICD-10-CM | POA: Diagnosis not present

## 2014-04-07 DIAGNOSIS — N189 Chronic kidney disease, unspecified: Secondary | ICD-10-CM | POA: Diagnosis not present

## 2014-04-07 DIAGNOSIS — M069 Rheumatoid arthritis, unspecified: Secondary | ICD-10-CM | POA: Diagnosis not present

## 2014-04-10 ENCOUNTER — Ambulatory Visit: Payer: Medicare Other | Admitting: Physical Therapy

## 2014-04-10 DIAGNOSIS — M545 Low back pain: Secondary | ICD-10-CM | POA: Diagnosis not present

## 2014-04-10 DIAGNOSIS — M48061 Spinal stenosis, lumbar region without neurogenic claudication: Secondary | ICD-10-CM

## 2014-04-10 DIAGNOSIS — M47816 Spondylosis without myelopathy or radiculopathy, lumbar region: Secondary | ICD-10-CM

## 2014-04-10 DIAGNOSIS — R262 Difficulty in walking, not elsewhere classified: Secondary | ICD-10-CM | POA: Diagnosis not present

## 2014-04-10 DIAGNOSIS — M25552 Pain in left hip: Secondary | ICD-10-CM | POA: Diagnosis not present

## 2014-04-10 DIAGNOSIS — Z5189 Encounter for other specified aftercare: Secondary | ICD-10-CM | POA: Diagnosis not present

## 2014-04-10 NOTE — Therapy (Signed)
Physical Therapy Treatment  Patient Details  Name: [name] MRN: [id number] Date of Birth: 07-Apr-1941  Encounter Date: 04/10/2014      PT End of Session - 04/10/14 1056    Visit Number 14   Number of Visits 24   Date for PT Re-Evaluation 05/04/14   PT Start Time 1022   PT Stop Time 1113   PT Time Calculation (min) 51 min   Activity Tolerance Patient limited by fatigue      Past Medical History  Diagnosis Date  . Diabetes mellitus   . Hypertension   . Thyroid disease   . Arthritis     endstage changes bilateral knees/bilateral ankles.   . High cholesterol   . 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 02/17/2012    Continue diplopia, left eye pain and left headaches.    . Depression, reactive   . RA (rheumatoid arthritis)     has been off methotreaxte since 10/13.  . Contusion of left knee     due to fall 1/14.  [name] URI (upper respiratory infection)   . Clotting disorder   . Asthma   . Carotid artery occlusion   . Interstitial lung disease   . Morbid obesity   . Shortness of breath   . Family history of heart disease   . Chronic kidney disease     Past Surgical History  Procedure Laterality Date  . Abdominal hysterectomy    . Ovary surgery    . Shoulder surgery    . Brain surgery      Gamma knife 10/13. Needs repeat spring  '14  . Video bronchoscopy Bilateral 05/31/2013    Procedure: VIDEO BRONCHOSCOPY WITHOUT FLUORO;  Surgeon: Brand Males, MD;  Location: [place];  Service: Cardiopulmonary;  Laterality: Bilateral;    There were no vitals taken for this visit.  Visit Diagnosis:  Lumbar facet arthropathy  Spinal stenosis of lumbar region      Subjective Assessment - 04/10/14 1026    Symptoms Pain in joints,ankles, knees and hands. Low back pain increased today too.  She attributes this to cold weather.  She may go back to Pulmonary Rehab after PT.    Pertinent History Multiple medical issues, RA, COPD   How long can you  stand comfortably? 10-15 min (better than walking) limited mostly by endurance   How long can you walk comfortably? 5-10 min with cane and no O2.    Currently in Pain? Yes   Pain Score 3    Pain Location Back   Pain Orientation Left   Pain Type Chronic pain   Pain Onset More than a month ago   Pain Frequency Intermittent   Aggravating Factors  cold, standing, walking   Pain Relieving Factors pain meds, rest, stretching   Multiple Pain Sites Yes   Pain Score 3   Pain Type Chronic pain   Pain Location Knee   Pain Orientation Right          [facility] PT Assessment - 04/10/14 1148    Strength   Left Hip Flexion 3-/5   Right Knee Extension --  painful today          [facility] Adult PT Treatment/Exercise - 04/10/14 1035    Knee/Hip Exercises: Stretches   Active Hamstring Stretch 2 reps;30 seconds   Knee/Hip Exercises: Standing   Lateral Step Up Right;Left;1 set;20 reps;Hand Hold: 1   Lateral Step Up Limitations 95 SaO2 HR 108   Forward Step Up --  toe taps for hip flex strength x20 each   Other Standing Knee Exercises heel raises x20   Knee/Hip Exercises: Seated   Long Arc Quad Strengthening;Both;1 set   [employer] Weight 5 lbs.   Other Seated Knee Exercises Clam with green back x22   Other Seated Knee Exercises Marching no band x10 each side   Modalities   Modalities Moist Heat   Moist Heat Therapy   Number Minutes Moist Heat 15 Minutes   Moist Heat Location --  lumbar                Problem List Patient Active Problem List   Diagnosis Date Noted  . Aphasia 02/12/2014  . 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 01/18/2014  . Lumbar facet arthropathy 01/18/2014  . Dyspnea 01/12/2014  . Acute bronchitis 07/24/2013  . Bilateral edema of lower extremity 05/30/2013  . Chronic cough 05/24/2013  . ILD (interstitial lung disease) 04/10/2013  . Greater trochanteric bursitis of left hip 08/13/2012  . Spinal stenosis of lumbar region with radiculopathy  07/12/2012  . Inability to walk 07/12/2012  . Acute lumbar back pain 07/12/2012  . Asymptomatic bacteriuria 07/12/2012  . 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 Brain mass 01/28/2012  . Chest pain 01/28/2012  . Rheumatoid arthritis 01/28/2012  . Osteoarthritis 01/28/2012  . High cholesterol   . Thyroid disease   . Hypertension        Raeford Razor, PT 04/10/2014 11:52 AM Phone: [phone] Fax: [phone]  04/10/2014, 11:52 AM

## 2014-04-12 ENCOUNTER — Encounter: Payer: Medicare Other | Admitting: Rehabilitation

## 2014-04-17 ENCOUNTER — Encounter: Payer: Self-pay | Admitting: Internal Medicine

## 2014-04-17 ENCOUNTER — Ambulatory Visit (INDEPENDENT_AMBULATORY_CARE_PROVIDER_SITE_OTHER): Payer: Medicare Other | Admitting: Internal Medicine

## 2014-04-17 VITALS — BP 128/86 | HR 92 | Ht 62.0 in | Wt 282.0 lb

## 2014-04-17 DIAGNOSIS — R0600 Dyspnea, unspecified: Secondary | ICD-10-CM

## 2014-04-17 DIAGNOSIS — J849 Interstitial pulmonary disease, unspecified: Secondary | ICD-10-CM | POA: Diagnosis not present

## 2014-04-17 DIAGNOSIS — Z79899 Other long term (current) drug therapy: Secondary | ICD-10-CM | POA: Insufficient documentation

## 2014-04-17 NOTE — Progress Notes (Signed)
Subjective:    Patient ID: [name], female    DOB: 03-26-1941, 73 y.o.   MRN: [id number]  HPI  PCP Maximino Greenland, MD [place] - Dr Estanislado Pandy Cards - Dr Woody Seller at [facility]  #Rheumatoid Arthritis  [facility] used to be on remicade with MTx around 2009. go. Then on Orencia with methotrexate. Last few years MTXaline and finally due to worsening jt disesae MTx stopped and she is on actmera with prednisone, colchicine, Plaquenil along with other pain medications of Neurontin, Cymbalta, morphine and Tylenol    - #Morbid OBesity - BMI 47.4 - nov 2014  - Body mass index is 53.03 kg/(m^2).     #Dyspnea since fall 2014, class 3 - major life limiting symptoms - cPST - could not preform May 2015    #ILD  - - Walk test 185 feet x3 laps on room air today 04/06/2013. She only completed 2 laps and got very dyspneic and fatigued. The heart rate was 111. She desaturated to 88%  - ono 04/11/13 shows lowerst pulse ox 79% and total time </= 89% at 6 min and </= 88% at 3 min. She has very mild desat. Does not qualify for overnight oxygen  Pulmonary function test 04/28/2013 shows FEV1 1.7 L/74%. FEV1 1.46 L/81%. Ratio to 84/109%. Suggestive of restriction. Total lung capacity is 2.9 L/58% and consistent with restriction.  - CT Nov 2014: NSIP Pattern  - Bronch BAL early 2015: BAL shows lot of neutrophils and ddx is acut viral/bacterial infection ([place] had just completed abx Rx) or aspiration (please check on this). Main thing is no opportunisitc infection given her extensive immunosuppressive regimen.   Pulmonary function test 09/08/2013: Show stability. FVC 1.78 L/77%. FEV1 1.6 L/90%. Ratio of 91/118%. Still notices restriction. Total lung capacity 3.57/70% and confirms restriction. DLCO 12.5/51% and consistent with restrictive interstitial lung disease. If at all Fev1 and TLC are improved compared to Dec 2014  Walk test 10/04/13 is currently on 2 liters of oxygen at Pulmonary Rehab.   During her initial 6MWT on 08/11/13 she de-saturated to 85% on room air.  2 liters kept her above 90%.     #Flare ups OV 07/18/2013 - #Acute bronchitis, RX with doxy and pred    OV 01/12/2014  Chief Complaint  Patient presents with  . Follow-up    Pt here after PFT's, CT and ONO. Pt states her breathing is unchanged. Pt states she is still doing pulmonary rehab. Pt states she gets SOB and CP at times when active. Pt denies cough.      Followup dyspnea in the setting of morbid obesity,  and rheumatoid arthritis related interstitial lung disease. Currently on prednisone, Plaquenil and actmera  This visit is to followup with series of tests that she underwent to evaluate for multifactorial dyspnea because she could not go cardiopulmonary stress testing in may 2015 due to low Fev1. The combination of pulmonary function tests, CT scan of the chest, walking desaturation test and overnight desaturation test all she'll presence of exertional hypoxemia , restriction with low diffusion and presence of interstitial lung disease which is not any worse compared to last year 2014. In other words her interstitial lung disease is nonprogressive.  She also underwent cardiac testing with Dr. Gwenlyn Found. She had echocardiogram that was normal except for grade 1 diastolic dysfunction and had a normal stress Myoview test.  She continues to be dyspneic but it appears now that pulmonary rehabilitation is helping and may be her dyspnea somewhat  better but overall she still has class-3 exertional dyspnea is relieved by rest without associated chest pain  Of note, in terms of her rheumatoid arthritis she says her current cocktail of medications are not helping as much and the joint pain is worse. I suggested that she spike speak to a rheumatologist Dr Bronson Curb about therapy modificatoos   Test review:    CT chest 10/05/13 - ILD is stable.   Walking desat test 10/05/13:  RA resting: 93% RA ambulating: 85% 2L  ambulating: 96%   ONO on RA 10/06/13 sho desatruation. Mean pulse ox 82.7%, time < 88% at 3h and 14 sec.     Pulmonary function tests 01/12/14:  shows post bronchodilator FVC of 1.46 L or 70%. Total lung capacity of 2.6 L or 72%. DLCO 16.7/77%. PFTs are consistent with restriction and interstitial lung disease. PFT show decline in lungFVC and TLC but also coressponds to weight gain. DLCO is the same   #shortness of breath  - this is due to Pulmonary Fibrosis, weight and heart muscle not relaxing well when walking (called diastolic dysfunction) - use o2 with exertion and sleep  - aim to lose weight - continue pulmonary rehab  #Intersitial Lung Disease (ILD, aka pulmonary fibros) - this appears stable based on CT scans and breathing test - this could get worse in the future - there is no cure for this  - to prevent it from getting worse, Dr Estanislado Pandy and you  could consider a medicine called immuran or cellcept but you need to discuss with her about risks v benefits   #FOllowup  3 months iwht office spirometry (not by TammyWilson) during followup   OV 04/17/2014  Chief Complaint  Patient presents with  . Follow-up    Pt states her breathing is unchanged since last OV. Pt states she is more activte than before and feels she is using her O2 more only cause of an increase in activity. Pt c/o chest pressure.      Follow-up multifactorial dyspnea in the setting of obesity and interstitial lung disease, interstitial lung disease in the setting of rheumatoid arthritis  Last visit was 3 months ago. This is a routine 3 month follow-up. Some 2 months ago she saw a rheumatologist and has been started on CellCept along with her prednisone and Plaquenil. For the last 4 weeks she is noticing an improvement significant in her dyspnea which is now moderate in severity. It is still brought on by exertion and relieved by rest. There is associated fatigue. The most significant problem right now is  fatigue. This a generalized fatigue and present mostly when the day but also worsened by exertion and relieved by rest. She is tolerating a CellCept and prednisone well. No side effects directly related to these medications. In terms of obesity: She says she's lost 20 pounds of weight through diet and she aims to continue this progress  Walk test 185 feet 3 laps on room air using a walker:   Review of Systems  Constitutional: Negative for fever and unexpected weight change.  HENT: Negative for congestion, dental problem, ear pain, nosebleeds, postnasal drip, rhinorrhea, sinus pressure, sneezing, sore throat and trouble swallowing.   Eyes: Negative for redness and itching.  Respiratory: Positive for chest tightness and shortness of breath. Negative for cough and wheezing.   Cardiovascular: Negative for palpitations and leg swelling.  Gastrointestinal: Negative for nausea and vomiting.  Genitourinary: Negative for dysuria.  Musculoskeletal: Negative for joint swelling.  Skin: Negative for  rash.  Neurological: Negative for headaches.  Hematological: Does not bruise/bleed easily.  Psychiatric/Behavioral: Negative for dysphoric mood. The patient is not nervous/anxious.    Current outpatient prescriptions: acetaminophen (TYLENOL) 500 MG tablet, Take 1,000 mg by mouth 2 (two) times daily as needed for pain. Takes with tramadol, Disp: , Rfl: ;  albuterol (PROVENTIL HFA;VENTOLIN HFA) 108 (90 BASE) MCG/ACT inhaler, Inhale 2 puffs into the lungs every 6 (six) hours as needed for wheezing or shortness of breath., Disp: , Rfl: ;  allopurinol (ZYLOPRIM) 100 MG tablet, Take 200 mg by mouth daily., Disp: , Rfl:  aspirin EC 81 MG tablet, Take 81 mg by mouth daily., Disp: , Rfl: ;  atorvastatin (LIPITOR) 20 MG tablet, Take 10 mg by mouth daily. , Disp: , Rfl: ;  Calcium Carb-Cholecalciferol (CALCIUM + D3 PO), Take 2 tablets by mouth daily., Disp: , Rfl: ;  colchicine 0.6 MG tablet, Take 0.6 mg by mouth daily as  needed (GOUT)., Disp: , Rfl: ;  dextromethorphan (DELSYM) 30 MG/5ML liquid, Take by mouth as needed for cough., Disp: , Rfl:  diclofenac sodium (VOLTAREN) 1 % GEL, Apply 1 application topically 3 (three) times daily. To right knee, Disp: 3 Tube, Rfl: 4;  DULoxetine (CYMBALTA) 60 MG capsule, Take 2 capsules (120 mg total) by mouth daily., Disp: 60 capsule, Rfl: 4;  furosemide (LASIX) 20 MG tablet, Take 20 mg by mouth daily., Disp: , Rfl:  gabapentin (NEURONTIN) 600 MG tablet, Take 1 tablet (600 mg total) by mouth as directed. Take 2 tablet in the morning, 2 tablet in the middle of the day, and 3 tablets at night, Disp: 210 tablet, Rfl: 1;  hydroxychloroquine (PLAQUENIL) 200 MG tablet, Take 200 mg by mouth 2 (two) times daily. , Disp: , Rfl: ;  levothyroxine (SYNTHROID, LEVOTHROID) 50 MCG tablet, Take 1 tablet (50 mcg total) by mouth daily., Disp: 30 tablet, Rfl: 1 linagliptin (TRADJENTA) 5 MG TABS tablet, Take 5 mg by mouth daily., Disp: , Rfl: ;  metoprolol succinate (TOPROL-XL) 100 MG 24 hr tablet, Take 100 mg by mouth daily. Take with or immediately following a meal., Disp: , Rfl: ;  morphine (MSIR) 15 MG tablet, Take 1 tablet (15 mg total) by mouth every 6 (six) hours as needed., Disp: 75 tablet, Rfl: 0;  Multiple Vitamin (MULTIVITAMIN WITH MINERALS) TABS, Take 1 tablet by mouth daily., Disp: , Rfl:  mycophenolate (CELLCEPT) 500 MG tablet, Take 500 mg by mouth 2 (two) times daily. , Disp: , Rfl: ;  olmesartan (BENICAR) 40 MG tablet, Take 40 mg by mouth daily., Disp: , Rfl: ;  OXYGEN-HELIUM IN, Inhale 2 L into the lungs at bedtime., Disp: , Rfl: ;  pantoprazole (PROTONIX) 40 MG tablet, take 1 tablet by mouth once daily 30 TO 60 MINUTES BEFORE 1ST MEAL OF THE DAY, Disp: 30 tablet, Rfl: 5 predniSONE (DELTASONE) 10 MG tablet, Take 7.5 mg by mouth daily with breakfast. , Disp: , Rfl: ;  Spacer/Aero-Holding Chambers (AEROCHAMBER MV) inhaler, Use as instructed, Disp: 1 each, Rfl: 0;  sulfamethoxazole-trimethoprim  (BACTRIM DS,SEPTRA DS) 800-160 MG per tablet, Take 1 tablet by mouth 3 (three) times a week., Disp: , Rfl:  traMADol (ULTRAM) 50 MG tablet, Take 1 tablet (50 mg total) by mouth every 4 (four) hours as needed., Disp: 120 tablet, Rfl: 3;  Vitamin D, Ergocalciferol, (DRISDOL) 50000 UNITS CAPS capsule, Take 50,000 Units by mouth every 7 (seven) days., Disp: , Rfl:       Objective:   Physical Exam  Filed Vitals:   04/17/14 1053  BP: 128/86  Pulse: 92   Filed Vitals:   04/17/14 1053  BP: 128/86  Pulse: 92  Height: _0  (1.575 m)  Weight: 282 lb (127.914 kg)  SpO2: 94%    chrnically ill looking, obese  HEENT: nl dentition, turbinates, and orophanx. Nl external ear canals without cough reflex   NECK :  without JVD/Nodes/TM/ nl carotid upstrokes bilaterally   LUNGS: no acc muscle use, clear to A and P bilaterally without cough on insp or exp maneuvers. CRACKLES at base + seems improved   CV:  RRR  no s3 or murmur or increase in P2, tr  edema   ABD:  soft and nontender with nl excursion in the supine position. No bruits or organomegaly, bowel sounds nl  MS:  warm without deformities, calf tenderness, cyanosis or clubbing. USES CANE  SKIN: warm and dry without lesions    NEURO:  alert, approp, no deficits        Assessment & Plan:     ICD-9-CM ICD-10-CM   1. ILD (interstitial lung disease) 515 J84.9   2. Dyspnea 786.09 R06.00   3. High risk medication use V58.69 Z79.899    #I shortness of breath  - this is due to Pulmonary Fibrosis, weight and heart muscle not relaxing well when walking (called diastolic dysfunction) - glad you are feeling better after some weight loss - you will only feel full normal when you reduce to normal weight - use o2 with exertion and sleep  - aim to lose weight more - continue pulmonary rehab  #Intersitial Lung Disease (ILD, aka pulmonary fibros) - this appears stable based on CT scans and breathing test as of aug 2015 and based on  history Nov 2015 - In fact your o2 levels are better and this is due to cellcept - I am glad so far you are tolerating cellcept and prednisone; continue this per Dr Estanislado Pandy because this medicine is supposed to prevent this from getting worse  #FOllowup  3 months with office spirometry (not by TammyWilson) during followup

## 2014-04-17 NOTE — Patient Instructions (Addendum)
#  I shortness of breath  - this is due to Pulmonary Fibrosis, weight and heart muscle not relaxing well when walking (called diastolic dysfunction) - glad you are feeling better after some weight loss - you will only feel full normal when you reduce to normal weight - use o2 with exertion and sleep  - aim to lose weight more - continue pulmonary rehab  #Intersitial Lung Disease (ILD, aka pulmonary fibros) - this appears stable based on CT scans and breathing test as of aug 2015 and based on history Nov 2015 - In fact your o2 levels are better and this is due to cellcept - I am glad so far you are tolerating cellcept and prednisone; continue this per Dr Estanislado Pandy because this medicine is supposed to prevent this from getting worse  #FOllowup  3 months with office spirometry (not by TammyWilson) during followup

## 2014-04-18 ENCOUNTER — Ambulatory Visit: Payer: Medicare Other | Attending: Physical Medicine & Rehabilitation | Admitting: Rehabilitation

## 2014-04-18 ENCOUNTER — Ambulatory Visit: Payer: Medicare Other | Admitting: Rehabilitation

## 2014-04-18 DIAGNOSIS — Z5189 Encounter for other specified aftercare: Secondary | ICD-10-CM | POA: Diagnosis not present

## 2014-04-18 DIAGNOSIS — M545 Low back pain: Secondary | ICD-10-CM | POA: Insufficient documentation

## 2014-04-18 DIAGNOSIS — M48061 Spinal stenosis, lumbar region without neurogenic claudication: Secondary | ICD-10-CM

## 2014-04-18 DIAGNOSIS — M47816 Spondylosis without myelopathy or radiculopathy, lumbar region: Secondary | ICD-10-CM

## 2014-04-18 DIAGNOSIS — M25552 Pain in left hip: Secondary | ICD-10-CM | POA: Diagnosis not present

## 2014-04-18 DIAGNOSIS — R262 Difficulty in walking, not elsewhere classified: Secondary | ICD-10-CM | POA: Diagnosis not present

## 2014-04-18 NOTE — Therapy (Signed)
Physical Therapy Treatment  Patient Details  Name: [name] MRN: [id number] Date of Birth: 15-Oct-1940  Encounter Date: 04/18/2014      PT End of Session - 04/18/14 1455    Visit Number 15   Number of Visits 24   Date for PT Re-Evaluation 05/04/14   PT Start Time 0220   PT Stop Time 0310   PT Time Calculation (min) 50 min      Past Medical History  Diagnosis Date  . Diabetes mellitus   . Hypertension   . Thyroid disease   . Arthritis     endstage changes bilateral knees/bilateral ankles.   . High cholesterol   . 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 02/17/2012    Continue diplopia, left eye pain and left headaches.    . Depression, reactive   . RA (rheumatoid arthritis)     has been off methotreaxte since 10/13.  . Contusion of left knee     due to fall 1/14.  [name] URI (upper respiratory infection)   . Clotting disorder   . Asthma   . Carotid artery occlusion   . Interstitial lung disease   . Morbid obesity   . Shortness of breath   . Family history of heart disease   . Chronic kidney disease     Past Surgical History  Procedure Laterality Date  . Abdominal hysterectomy    . Ovary surgery    . Shoulder surgery    . Brain surgery      Gamma knife 10/13. Needs repeat spring  '14  . Video bronchoscopy Bilateral 05/31/2013    Procedure: VIDEO BRONCHOSCOPY WITHOUT FLUORO;  Surgeon: Brand Males, MD;  Location: [place];  Service: Cardiopulmonary;  Laterality: Bilateral;    There were no vitals taken for this visit.  Visit Diagnosis:  Lumbar facet arthropathy  Spinal stenosis of lumbar region      Subjective Assessment - 04/18/14 1423    Symptoms Pain is very mild, no more than a 2/10 today, low back, MD thinks I am stable, performed walk test, scored much better.   Limitations Standing;House hold activities;Walking   Currently in Pain? Yes   Pain Score 2    Pain Location Back   Pain Orientation Lower   Pain Descriptors /  Indicators Aching   Pain Type Chronic pain   Aggravating Factors  standing, walking, bend over   Pain Relieving Factors rest,   Multiple Pain Sites No            [facility] Adult PT Treatment/Exercise - 04/18/14 1431    Lumbar Exercises: Machines for Strengthening   Other Lumbar Machine Exercise --  NuStep 10 min level 3   Knee/Hip Exercises: Seated   Long Arc Quad Strengthening;Both;10 reps;2 sets   [employer] Weight 5 lbs.   Other Seated Knee Exercises Clam with green back x30   Other Seated Knee Exercises Marching with and without band x10 each side   Ankle Exercises: Standing   Toe Raise 10 reps  2 sets   Balance Exercises: Standing   SLS --  with 3 way hip bilateral x 10 each   Heel Raises 10 reps  2 sets              PT Long Term Goals - 04/18/14 1456    PT LONG TERM GOAL #1   Title Pt. will demo/verbalize techniques to reduce risk of re-injury related to posture, body mechanics.   Time 6  Period Weeks   Status Achieved   PT LONG TERM GOAL #2   Title Pt will be I with advanced HEP   Time 6   Period Weeks   Status On-going   PT LONG TERM GOAL #3   Title Patient will be able to stand to do light housework for 15 min without rest breaks   Time 6   Period Weeks   Status Achieved   PT LONG TERM GOAL #4   Title Pt. will demo Berg score 53/56 to demo decreased balance   Time 6   Period Weeks   Status On-going   PT LONG TERM GOAL #5   Title Pt. will be able to walk on a short community errand with device and report mod fatigue overall   Time 6   Period Weeks   Status On-going          Plan - 04/18/14 1457    Clinical Impression Statement Pt reports improved endurance to attending doctor's visit, was less fatigued, did not use oxygen, was able to maintain standing for 10 min of therex today with SP02 >/= 93%   PT Next Visit Plan Advance endurance and overall mobility. Check trunk flexibility        Problem List Patient Active Problem List    Diagnosis Date Noted  . High risk medication use 04/17/2014  . Aphasia 02/12/2014  . 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 01/18/2014  . Lumbar facet arthropathy 01/18/2014  . Dyspnea 01/12/2014  . Acute bronchitis 07/24/2013  . Bilateral edema of lower extremity 05/30/2013  . Chronic cough 05/24/2013  . ILD (interstitial lung disease) 04/10/2013  . Greater trochanteric bursitis of left hip 08/13/2012  . Spinal stenosis of lumbar region with radiculopathy 07/12/2012  . Inability to walk 07/12/2012  . Acute lumbar back pain 07/12/2012  . Asymptomatic bacteriuria 07/12/2012  . 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 Brain mass 01/28/2012  . Chest pain 01/28/2012  . Rheumatoid arthritis 01/28/2012  . Osteoarthritis 01/28/2012  . High cholesterol   . Thyroid disease   . Hypertension    Dorene Ar, PTA 04/18/2014, 3:00 PM

## 2014-04-21 ENCOUNTER — Ambulatory Visit: Payer: Medicare Other | Admitting: Physical Therapy

## 2014-04-21 ENCOUNTER — Telehealth: Payer: Self-pay | Admitting: *Deleted

## 2014-04-21 NOTE — Telephone Encounter (Signed)
Patient called and left message - stating that the pain in her right leg has intensified greatly and is now shoot into her heal, making it difficult to walk.  She is asking about an injection... Please advise

## 2014-04-21 NOTE — Telephone Encounter (Signed)
Moved up appointment - Dr. Naaman Plummer will re-evaluate right knee pain and pain in right foot.  Patient was called and appt confirmed

## 2014-04-21 NOTE — Telephone Encounter (Signed)
I would like to see her back in the office to re-eval. i was under the impression she was doing a little better when we last met. I had referred her to PT as well

## 2014-04-26 ENCOUNTER — Encounter: Payer: Self-pay | Admitting: Physical Medicine & Rehabilitation

## 2014-04-26 ENCOUNTER — Encounter: Payer: Medicare Other | Attending: Physical Medicine & Rehabilitation | Admitting: Physical Medicine & Rehabilitation

## 2014-04-26 VITALS — BP 135/73 | HR 98 | Resp 14 | Ht 62.0 in | Wt 286.0 lb

## 2014-04-26 DIAGNOSIS — Z5181 Encounter for therapeutic drug level monitoring: Secondary | ICD-10-CM | POA: Insufficient documentation

## 2014-04-26 DIAGNOSIS — M4806 Spinal stenosis, lumbar region: Secondary | ICD-10-CM | POA: Diagnosis not present

## 2014-04-26 DIAGNOSIS — M069 Rheumatoid arthritis, unspecified: Secondary | ICD-10-CM | POA: Diagnosis not present

## 2014-04-26 DIAGNOSIS — Z79899 Other long term (current) drug therapy: Secondary | ICD-10-CM | POA: Insufficient documentation

## 2014-04-26 DIAGNOSIS — M7062 Trochanteric bursitis, left hip: Secondary | ICD-10-CM | POA: Insufficient documentation

## 2014-04-26 DIAGNOSIS — M5416 Radiculopathy, lumbar region: Secondary | ICD-10-CM | POA: Insufficient documentation

## 2014-04-26 DIAGNOSIS — G894 Chronic pain syndrome: Secondary | ICD-10-CM | POA: Diagnosis not present

## 2014-04-26 DIAGNOSIS — M1711 Unilateral primary osteoarthritis, right knee: Secondary | ICD-10-CM | POA: Diagnosis not present

## 2014-04-26 DIAGNOSIS — M1712 Unilateral primary osteoarthritis, left knee: Secondary | ICD-10-CM | POA: Insufficient documentation

## 2014-04-26 NOTE — Patient Instructions (Signed)
PLEASE CALL ME WITH ANY PROBLEMS OR QUESTIONS (#[phone]).      

## 2014-04-26 NOTE — Progress Notes (Signed)
Subjective:    Patient ID: [name], female    DOB: Aug 02, 1940, 73 y.o.   MRN: [id number]  HPI   Mrs. Guerrieri is back regarding her chronic pain. About 2 weeks ago, she "over did" it while working out and the right knee started to swell and become painful. The left knee swelled as well but not as badly as the right. She wrapped the knee and used some ice but the pain wouldn't go away. She has swelling additionally in both legs.   Her HGB A1C is 6.4---it was elevated a bit to higher dose of prednisone    Pain Inventory Average Pain 7 Pain Right Now 7 My pain is intermittent, sharp, burning and aching  In the last 24 hours, has pain interfered with the following? General activity 7 Relation with others 7 Enjoyment of life 7 What TIME of day is your pain at its worst? morning, evening, night Sleep (in general) Fair  Pain is worse with: bending, standing and some activites Pain improves with: medication Relief from Meds: 4  Mobility walk with assistance use a walker how many minutes can you walk? 10 ability to climb steps?  yes do you drive?  yes use a wheelchair transfers alone  Function not employed: date last employed . I need assistance with the following:  dressing, household duties and shopping  Neuro/Psych bladder control problems weakness numbness tingling trouble walking anxiety  Prior Studies Any changes since last visit?  yes  Physicians involved in your care Any changes since last visit?  no   Family History  Problem Relation Age of Onset  . Diabetes Mother   . Heart attack Mother   . Hypertension Father   . Lung cancer Father   . Diabetes Sister   . Diabetes Brother   . Hypertension Brother    History   Social History  . Marital Status: Married    Spouse Name: N/A    Number of Children: 6  . Years of Education: college   Occupational History  . retired    Social History Main Topics  . Smoking status: Never Smoker   .  Smokeless tobacco: Never Used  . Alcohol Use: No  . Drug Use: No  . Sexual Activity: Not Currently   Other Topics Concern  . None   Social History Narrative   Patient consumes 2-3 cups coffee per day.   Past Surgical History  Procedure Laterality Date  . Abdominal hysterectomy    . Ovary surgery    . Shoulder surgery    . Brain surgery      Gamma knife 10/13. Needs repeat spring  '14  . Video bronchoscopy Bilateral 05/31/2013    Procedure: VIDEO BRONCHOSCOPY WITHOUT FLUORO;  Surgeon: Brand Males, MD;  Location: [facility];  Service: Cardiopulmonary;  Laterality: Bilateral;   Past Medical History  Diagnosis Date  . Diabetes mellitus   . Hypertension   . Thyroid disease   . Arthritis     endstage changes bilateral knees/bilateral ankles.   . High cholesterol   . 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 02/17/2012    Continue diplopia, left eye pain and left headaches.    . Depression, reactive   . RA (rheumatoid arthritis)     has been off methotreaxte since 10/13.  . Contusion of left knee     due to fall 1/14.  [name] URI (upper respiratory infection)   . Clotting disorder   . Asthma   .  Carotid artery occlusion   . Interstitial lung disease   . Morbid obesity   . Shortness of breath   . Family history of heart disease   . Chronic kidney disease    BP 135/73 mmHg  Pulse 98  Resp 14  Ht _0  (1.575 m)  Wt 286 lb (129.729 kg)  BMI 52.30 kg/m2  SpO2 96%  Opioid Risk Score:   Fall Risk Score: Moderate Fall Risk (6-13 points) Review of Systems  Constitutional: Positive for unexpected weight change.       Weight gain   Respiratory: Positive for shortness of breath.        Respiratory infections  Cardiovascular: Positive for leg swelling.  Gastrointestinal: Positive for nausea.  Genitourinary:       Incontinence  Musculoskeletal: Positive for gait problem.  Neurological: Positive for weakness and numbness.       Tingling    Psychiatric/Behavioral: The patient is nervous/anxious.   All other systems reviewed and are negative.      Objective:   Physical Exam  General: Alert and oriented x 3, No apparent distress  HEENT: Head is normocephalic, atraumatic, PERRLA, EOMI, sclera anicteric, oral mucosa pink and moist, dentition intact, ext ear canals clear,  Neck: Supple without JVD or lymphadenopathy  Heart: Reg rate and rhythm. No murmurs rubs or gallops  Chest: sob with gait, sometimes at rest. Phonates well  Abdomen: Soft, non-tender, non-distended, bowel sounds positive.  Extremities: No clubbing, cyanosis, 2+ edema. Pulses are 2+  Skin: Clean and intact without signs of breakdown  Neuro: Pt is cognitively appropriate with normal insight, memory, and awareness. Cranial nerves 2-12 are intact. Sensory exam is near normal in the legs... Reflexes are 2+ in all 4's. Fine motor coordination is intact. No tremors. Motor function is grossly 4 to 5/5 with pain inhbition. Musculoskeletal: low back and left hip still tender. She is slightly elevated in the left pelvis. She is antalgic with gait on the left. Crepitus in both knees with medial joint line pain bilaterally. Mild joint effusion.  No gross knee instability. Gait is antalgic.   Pain is more prominent at the IT on the left.  Psych: Pt's affect is appropriate. Pt is cooperative   Assessment & Plan:   Assessment:  1. Lumbar spinal stenosis with neurogenic claudication. Associated facet arthropathy  2. Depression.  3. Diabetes mellitus type 2 with polyneuropathy  4. Rheumatoid arthritis and osteoarthritis. Multiple joint involvement including knees.  5. Left greater troch bursitis  6. Interstitial lung disease    Plan:  1. Continue with HEP for pulmonary rehab  2. After informed consent and preparation of the skin with betadine and isopropyl alcohol, I injected 76m (1cc) of celestone and 4cc of 1% lidocaine into the bilateral knees via anterior-lateral  approach. Additionally, aspiration was performed prior to injection. The patient tolerated well, and no complications were encountered. Afterward the area was cleaned and dressed. Post- injection instructions were provided.    3. MSO4 for breakthrough pain, one q8 prn #75---none needed today. She is requiring less  -continue tramadol for less severe pain up to 3-4x per day (did not need refills either)  4. She understands the importance of weight loss in managing her pain, bp, and CV issues.  5. Continue cymbalta to 632mbid for neuropathic and axial spine pain. Refill today  6. Follow up with me in 3 months. 30 minutes of face to face patient care time were spent during this visit. All questions were  encouraged and answered.

## 2014-05-03 DIAGNOSIS — I129 Hypertensive chronic kidney disease with stage 1 through stage 4 chronic kidney disease, or unspecified chronic kidney disease: Secondary | ICD-10-CM | POA: Diagnosis not present

## 2014-05-03 DIAGNOSIS — N183 Chronic kidney disease, stage 3 (moderate): Secondary | ICD-10-CM | POA: Diagnosis not present

## 2014-05-03 DIAGNOSIS — E785 Hyperlipidemia, unspecified: Secondary | ICD-10-CM | POA: Diagnosis not present

## 2014-05-03 DIAGNOSIS — N2581 Secondary hyperparathyroidism of renal origin: Secondary | ICD-10-CM | POA: Diagnosis not present

## 2014-05-03 DIAGNOSIS — E1129 Type 2 diabetes mellitus with other diabetic kidney complication: Secondary | ICD-10-CM | POA: Diagnosis not present

## 2014-05-08 ENCOUNTER — Encounter: Payer: Self-pay | Admitting: Neurology

## 2014-05-15 ENCOUNTER — Ambulatory Visit: Payer: Medicare Other | Admitting: Neurology

## 2014-05-22 ENCOUNTER — Encounter: Payer: Medicare Other | Admitting: Physical Therapy

## 2014-05-22 NOTE — Therapy (Signed)
[facility] [place], [place], [address] Phone: [phone]   Fax:  [phone]  Patient Details  Name: [name] MRN: [id number] Date of Birth: 10/16/1940  Encounter Date: 05/22/2014   PHYSICAL THERAPY DISCHARGE SUMMARY  Visits from Start of Care: 15  Current functional level related to goals / functional outcomes: See above for goals met  Remaining deficits: Endurance, ROM and strength   Education / Equipment:HEP, posture and general exercise Plan: Patient agrees to discharge.  Patient goals were partially met. Patient is being discharged due to being pleased with the current functional level.  ?????      PT LONG TERM GOAL #1    Title  Pt. will demo/verbalize techniques to reduce risk of re-injury related to posture, body mechanics.    Time  6    Period  Weeks    Status  Achieved    PT LONG TERM GOAL #2    Title  Pt will be I with advanced HEP    Time  6    Period  Weeks    Status  On-going    PT LONG TERM GOAL #3    Title  Patient will be able to stand to do light housework for 15 min without rest breaks    Time  6    Period  Weeks    Status  Achieved    PT LONG TERM GOAL #4    Title  Pt. will demo Berg score 53/56 to demo decreased balance    Time  6    Period  Weeks    Status  On-going    PT LONG TERM GOAL #5    Title  Pt. will be able to walk on a short community errand with device and report mod fatigue overall    Time  6    Period  Weeks    Status  On-going    Raeford Razor, [place] 05/22/2014 12:27 PM Phone: [phone] Fax: [phone]   Aleta Manternach 05/22/2014, 12:26 PM  [facility] [address] [place], [place], [address] Phone: [phone]   Fax:  [phone]

## 2014-05-24 ENCOUNTER — Encounter: Payer: Self-pay | Admitting: Cardiology

## 2014-05-24 ENCOUNTER — Ambulatory Visit (INDEPENDENT_AMBULATORY_CARE_PROVIDER_SITE_OTHER): Payer: Medicare Other | Admitting: Cardiology

## 2014-05-24 VITALS — BP 112/78 | HR 91 | Ht 62.0 in | Wt 289.2 lb

## 2014-05-24 DIAGNOSIS — R0609 Other forms of dyspnea: Secondary | ICD-10-CM | POA: Diagnosis not present

## 2014-05-24 DIAGNOSIS — E7800 Pure hypercholesterolemia, unspecified: Secondary | ICD-10-CM

## 2014-05-24 DIAGNOSIS — I10 Essential (primary) hypertension: Secondary | ICD-10-CM | POA: Diagnosis not present

## 2014-05-24 DIAGNOSIS — R0600 Dyspnea, unspecified: Secondary | ICD-10-CM | POA: Insufficient documentation

## 2014-05-24 NOTE — Assessment & Plan Note (Signed)
Felt to be Pulmonary fibrosis.  Her echo with only grade 1 diastolic dysfunction and BP well controlled.  Continue with exercise, she may need to wear oxygen more frequently - she will discuss with Pulmonary.  Will notify Dr. Adora Fridge of continued DOE to see if any recommendations.

## 2014-05-24 NOTE — Patient Instructions (Signed)
Your physician wants you to follow-up in: 6 Months You will receive a reminder letter in the mail two months in advance. If you don't receive a letter, please call our office to schedule the follow-up appointment.

## 2014-05-24 NOTE — Progress Notes (Signed)
05/24/2014   PCP: Maximino Greenland, MD   Chief Complaint  Patient presents with  . Follow-up    6 month follow up, Pt. complaining of chest pressure,tightness, SOB, and ankle/leg swelling.    Primary Cardiologist: Dr. Adora Fridge   HPI: 74 year old moderately overweight married African American female mother of 6 children, grandmother of a grandchildren referred by [name] for cardiovascular evaluation because of progressive dyspnea on exertion and chest pain.  She does have a strong family history of heart disease with her mother and 54 brothers old with an ischemic heart disease. She has never had a heart attack or stroke. She does have interstitial lung disease as well as rheumatoid arthritis. She is on home oxygen. She also has a brain tumor/meningioma 2 with gamma knife therapy She was evaluated by a cardiologist either go with a limited 2-D echocardiogram and astress test and was told that these were normal. She's had progressive disabling dyspnea as well as daily chest pain.  She was evaluated by Dr. Adora Fridge in June of this year and had normal Echo except for grade 1 diastolic dysfunction and Normal stress test.   Pt back today for follow up.  She continues with chest tightness at times with exertion and mostly SOB.  She feels better lying down most likely to improved chest expansion due to body habitus.  She has been exercising, she is watching her diet, though over the holidays not so much.  She has completed PT rehab for spinal stenosis, and has done Pulmonary rehab.  She saw Pulmonary and he noted Dr. Kennon Holter results.  Pulmonary fibrosis is cause of her DOE.    She is using her oxygen at night and at times during the day.    Allergies  Allergen Reactions  . Codeine Swelling    Facial swelling  . Remicade [Infliximab]     "sent me into shock"  . Zestril [Lisinopril] Swelling    Face and neck swelling    Current Outpatient Prescriptions  Medication Sig Dispense  Refill  . acetaminophen (TYLENOL) 500 MG tablet Take 1,000 mg by mouth 2 (two) times daily as needed for pain. Takes with tramadol    . acetaminophen (TYLENOL) 500 MG tablet Take 500 mg by mouth.    [name] albuterol (PROVENTIL HFA;VENTOLIN HFA) 108 (90 BASE) MCG/ACT inhaler Inhale 2 puffs into the lungs every 6 (six) hours as needed for wheezing or shortness of breath.    . allopurinol (ZYLOPRIM) 100 MG tablet Take 200 mg by mouth daily.    [name] aspirin 81 MG chewable tablet Chew 81 mg by mouth.    [name] aspirin EC 81 MG tablet Take 81 mg by mouth daily.    [name] atorvastatin (LIPITOR) 20 MG tablet Take 10 mg by mouth daily.     . Calcium Carb-Cholecalciferol (CALCIUM + D3 PO) Take 2 tablets by mouth daily.    . colchicine 0.6 MG tablet Take 0.6 mg by mouth daily as needed (GOUT).    [name] dextromethorphan (DELSYM) 30 MG/5ML liquid Take by mouth as needed for cough.    . diclofenac sodium (VOLTAREN) 1 % GEL Apply 1 application topically 3 (three) times daily. To right knee 3 Tube 4  . DULoxetine (CYMBALTA) 60 MG capsule Take 2 capsules (120 mg total) by mouth daily. 60 capsule 4  . furosemide (LASIX) 20 MG tablet Take 20 mg by mouth daily.    [name] gabapentin (NEURONTIN) 600 MG tablet  Take 1 tablet (600 mg total) by mouth as directed. Take 2 tablet in the morning, 2 tablet in the middle of the day, and 3 tablets at night 210 tablet 1  . hydroxychloroquine (PLAQUENIL) 200 MG tablet Take 200 mg by mouth 2 (two) times daily.     [name] levothyroxine (SYNTHROID, LEVOTHROID) 50 MCG tablet Take 1 tablet (50 mcg total) by mouth daily. 30 tablet 1  . linagliptin (TRADJENTA) 5 MG TABS tablet Take 5 mg by mouth daily.    . metoprolol succinate (TOPROL-XL) 100 MG 24 hr tablet Take 100 mg by mouth daily. Take with or immediately following a meal.    . morphine (MSIR) 15 MG tablet Take 1 tablet (15 mg total) by mouth every 6 (six) hours as needed. 75 tablet 0  . Multiple Vitamin (MULTIVITAMIN WITH MINERALS) TABS Take 1 tablet by mouth  daily.    . mycophenolate (CELLCEPT) 500 MG tablet Take 500 mg by mouth 2 (two) times daily.     [name] olmesartan (BENICAR) 40 MG tablet Take 40 mg by mouth daily.    . OXYGEN-HELIUM IN Inhale 2 L into the lungs at bedtime.    . pantoprazole (PROTONIX) 40 MG tablet take 1 tablet by mouth once daily 30 TO 60 MINUTES BEFORE 1ST MEAL OF THE DAY 30 tablet 5  . predniSONE (DELTASONE) 10 MG tablet Take 7.5 mg by mouth daily with breakfast.     . Spacer/Aero-Holding Chambers (AEROCHAMBER MV) inhaler Use as instructed 1 each 0  . sulfamethoxazole-trimethoprim (BACTRIM DS,SEPTRA DS) 800-160 MG per tablet Take 1 tablet by mouth 3 (three) times a week.    . traMADol (ULTRAM) 50 MG tablet Take 1 tablet (50 mg total) by mouth every 4 (four) hours as needed. 120 tablet 3  . Vitamin D, Ergocalciferol, (DRISDOL) 50000 UNITS CAPS capsule Take 50,000 Units by mouth every 7 (seven) days.    [name] ACCU-CHEK SMARTVIEW test strip   1  . BENICAR HCT 40-25 MG per tablet   0   No current facility-administered medications for this visit.    Past Medical History  Diagnosis Date  . Diabetes mellitus   . Hypertension   . Thyroid disease   . Arthritis     endstage changes bilateral knees/bilateral ankles.   . High cholesterol   . 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 02/17/2012    Continue diplopia, left eye pain and left headaches.    . Depression, reactive   . RA (rheumatoid arthritis)     has been off methotreaxte since 10/13.  . Contusion of left knee     due to fall 1/14.  [name] URI (upper respiratory infection)   . Clotting disorder   . Asthma   . Carotid artery occlusion   . Interstitial lung disease   . Morbid obesity   . Shortness of breath   . Family history of heart disease   . Chronic kidney disease     Past Surgical History  Procedure Laterality Date  . Abdominal hysterectomy    . Ovary surgery    . Shoulder surgery    . Brain surgery      Gamma knife 10/13. Needs repeat spring   '14  . Video bronchoscopy Bilateral 05/31/2013    Procedure: VIDEO BRONCHOSCOPY WITHOUT FLUORO;  Surgeon: Brand Males, MD;  Location: [facility];  Service: Cardiopulmonary;  Laterality: Bilateral;    GMW:NUUVOZD:GU colds or fevers, mild weight increase Skin:no rashes or ulcers HEENT:no blurred vision, no  congestion CV:see HPI PUL:see HPI GI:no diarrhea constipation or melena, no indigestion GU:no hematuria, no dysuria MS:no joint pain, no claudication, + lower ext edema improves overnight but back by end of day. Neuro:no syncope, no lightheadedness Endo:+  Diabetes stable, no thyroid disease  Wt Readings from Last 3 Encounters:  05/24/14 289 lb 3.2 oz (131.18 kg)  04/26/14 286 lb (129.729 kg)  04/17/14 282 lb (127.914 kg)    PHYSICAL EXAM BP 112/78 mmHg  Pulse 91  Ht _0  (1.575 m)  Wt 289 lb 3.2 oz (131.18 kg)  BMI 52.88 kg/m2 General:Pleasant affect, NAD, though with any exertion she is SOB Skin:Warm and dry, brisk capillary refill HEENT:normocephalic, sclera clear, mucus membranes moist Neck:supple, no JVD, no bruits  Heart:S1S2 RRR without murmur, gallup, rub or click Lungs:clear but diminished breath sounds without rales, rhonchi, or wheezes HMC:NOBSJ, soft, non tender, + BS, do not palpate liver spleen or masses Ext:1+ lower ext edema,  2+ radial pulses Neuro:alert and oriented X 3, MAE, follows commands, + facial symmetry  EKG:SR, normal EKG and no change since June EKG  ASSESSMENT AND PLAN Hypertension Controlled BP  High cholesterol treated   DOE (dyspnea on exertion) Felt to be Pulmonary fibrosis.  Her echo with only grade 1 diastolic dysfunction and BP well controlled.  Continue with exercise, she may need to wear oxygen more frequently - she will discuss with Pulmonary.  Will notify Dr. Adora Fridge of continued DOE to see if any recommendations.     She will follow up with Dr. Adora Fridge in 6 months.

## 2014-05-24 NOTE — Assessment & Plan Note (Signed)
Controlled BP

## 2014-05-24 NOTE — Assessment & Plan Note (Signed)
treated

## 2014-05-31 ENCOUNTER — Ambulatory Visit: Payer: Self-pay | Admitting: Neurology

## 2014-06-06 ENCOUNTER — Ambulatory Visit (INDEPENDENT_AMBULATORY_CARE_PROVIDER_SITE_OTHER)
Admission: RE | Admit: 2014-06-06 | Discharge: 2014-06-06 | Disposition: A | Discharge status: Home / Self Care | Payer: Medicare Other | Source: Ambulatory Visit | Attending: Adult Health | Admitting: Adult Health

## 2014-06-06 ENCOUNTER — Ambulatory Visit (INDEPENDENT_AMBULATORY_CARE_PROVIDER_SITE_OTHER): Payer: Medicare Other | Admitting: Adult Health

## 2014-06-06 ENCOUNTER — Encounter: Payer: Self-pay | Admitting: Adult Health

## 2014-06-06 ENCOUNTER — Other Ambulatory Visit (INDEPENDENT_AMBULATORY_CARE_PROVIDER_SITE_OTHER): Payer: Medicare Other

## 2014-06-06 VITALS — HR 105 | Temp 98.9°F | Ht 62.0 in | Wt 291.0 lb

## 2014-06-06 DIAGNOSIS — R05 Cough: Secondary | ICD-10-CM

## 2014-06-06 DIAGNOSIS — R0600 Dyspnea, unspecified: Secondary | ICD-10-CM

## 2014-06-06 DIAGNOSIS — I10 Essential (primary) hypertension: Secondary | ICD-10-CM

## 2014-06-06 DIAGNOSIS — J849 Interstitial pulmonary disease, unspecified: Secondary | ICD-10-CM | POA: Diagnosis not present

## 2014-06-06 DIAGNOSIS — R0989 Other specified symptoms and signs involving the circulatory and respiratory systems: Secondary | ICD-10-CM | POA: Diagnosis not present

## 2014-06-06 DIAGNOSIS — R053 Chronic cough: Secondary | ICD-10-CM

## 2014-06-06 DIAGNOSIS — J209 Acute bronchitis, unspecified: Secondary | ICD-10-CM

## 2014-06-06 LAB — BASIC METABOLIC PANEL
BUN: 18 mg/dL (ref 6–23)
CHLORIDE: 102 meq/L (ref 96–112)
CO2: 32 mEq/L (ref 19–32)
CREATININE: 1.07 mg/dL (ref 0.40–1.20)
Calcium: 8.8 mg/dL (ref 8.4–10.5)
GFR: 64.73 mL/min (ref >60.00)
Glucose, Bld: 121 mg/dL — ABNORMAL HIGH (ref 70–99)
Potassium: 3.9 mEq/L (ref 3.5–5.1)
SODIUM: 140 meq/L (ref 135–145)

## 2014-06-06 LAB — BRAIN NATRIURETIC PEPTIDE: Pro B Natriuretic peptide (BNP): 18 pg/mL (ref 0.0–100.0)

## 2014-06-06 MED ORDER — LEVALBUTEROL HCL 0.63 MG/3ML IN NEBU
0.6300 mg | INHALATION_SOLUTION | Freq: Once | RESPIRATORY_TRACT | Status: AC
  Administered 2014-06-06: 0.63 mg via RESPIRATORY_TRACT

## 2014-06-06 MED ORDER — PREDNISONE 10 MG PO TABS: ORAL_TABLET | ORAL | Status: DC

## 2014-06-06 NOTE — Assessment & Plan Note (Signed)
Flare   Plan  Increase Prednisone 11m daily for 1 week then 17mdaily for week then back to 7.76m88maily  Take Extra Lasix 11m56mily for 3 days  Labs and xray today .  Mucinex DM Twice daily  As needed  Cough/congestion .  Follow up Dr. RamaChase Caller6 weeks and As needed   Please contact office for sooner follow up if symptoms do not improve or worsen or seek emergency care

## 2014-06-06 NOTE — Assessment & Plan Note (Signed)
Flare  Check cxr  Plan  Increase Prednisone 36m daily for 1 week then 132mdaily for week then back to 7.83m53maily  Take Extra Lasix 42m683mily for 3 days  Labs and xray today .  Mucinex DM Twice daily  As needed  Cough/congestion .  Follow up Dr. RamaChase Caller6 weeks and As needed   Please contact office for sooner follow up if symptoms do not improve or worsen or seek emergency care

## 2014-06-06 NOTE — Patient Instructions (Addendum)
Increase Prednisone 3m daily for 1 week then 160mdaily for week then back to 7.7m83maily  Take Extra Lasix 74m47mily for 3 days  Labs and xray today .  Mucinex DM Twice daily  As needed  Cough/congestion .  Follow up Dr. RamaChase Caller6 weeks and As needed   Please contact office for sooner follow up if symptoms do not improve or worsen or seek emergency care

## 2014-06-06 NOTE — Assessment & Plan Note (Signed)
?  flare  Check cxr   Plan  Increase Prednisone 48m daily for 1 week then 164mdaily for week then back to 7.40m37maily   Labs and xray today .  Mucinex DM Twice daily  As needed  Cough/congestion .  Follow up Dr. RamChase Caller 6 weeks and As needed   Please contact office for sooner follow up if symptoms do not improve or worsen or seek emergency care

## 2014-06-06 NOTE — Progress Notes (Signed)
Subjective:    Patient ID: [name], female    DOB: 12-Jul-1940, 74 y.o.   MRN: [id number]  HPI  PCP Maximino Greenland, MD [place] - Dr Estanislado Pandy Cards - Dr Woody Seller at [facility]  #Rheumatoid Arthritis  [facility] used to be on remicade with MTx around 2009. go. Then on Orencia with methotrexate. Last few years MTXaline and finally due to worsening jt disesae MTx stopped and she is on actmera with prednisone, colchicine, Plaquenil along with other pain medications of Neurontin, Cymbalta, morphine and Tylenol    - #Morbid OBesity - BMI 47.4 - nov 2014  - Body mass index is 53.03 kg/(m^2).     #Dyspnea since fall 2014, class 3 - major life limiting symptoms - cPST - could not preform May 2015    #ILD  - - Walk test 185 feet x3 laps on room air today 04/06/2013. She only completed 2 laps and got very dyspneic and fatigued. The heart rate was 111. She desaturated to 88%  - ono 04/11/13 shows lowerst pulse ox 79% and total time </= 89% at 6 min and </= 88% at 3 min. She has very mild desat. Does not qualify for overnight oxygen  Pulmonary function test 04/28/2013 shows FEV1 1.7 L/74%. FEV1 1.46 L/81%. Ratio to 84/109%. Suggestive of restriction. Total lung capacity is 2.9 L/58% and consistent with restriction.  - CT Nov 2014: NSIP Pattern  - Bronch BAL early 2015: BAL shows lot of neutrophils and ddx is acut viral/bacterial infection (Dargan had just completed abx Rx) or aspiration (please check on this). Main thing is no opportunisitc infection given her extensive immunosuppressive regimen.   Pulmonary function test 09/08/2013: Show stability. FVC 1.78 L/77%. FEV1 1.6 L/90%. Ratio of 91/118%. Still notices restriction. Total lung capacity 3.57/70% and confirms restriction. DLCO 12.5/51% and consistent with restrictive interstitial lung disease. If at all Fev1 and TLC are improved compared to Dec 2014  Walk test 10/04/13 is currently on 2 liters of oxygen at [facility].   During her initial 6MWT on 08/11/13 she de-saturated to 85% on room air.  2 liters kept her above 90%.     #Flare ups OV 07/18/2013 - #Acute bronchitis, RX with doxy and pred    OV 01/12/2014  Chief Complaint  Patient presents with  . Follow-up    Pt here after PFT's, CT and ONO. Pt states her breathing is unchanged. Pt states she is still doing pulmonary rehab. Pt states she gets SOB and CP at times when active. Pt denies cough.      Followup dyspnea in the setting of morbid obesity,  and rheumatoid arthritis related interstitial lung disease. Currently on prednisone, Plaquenil and actmera  This visit is to followup with series of tests that she underwent to evaluate for multifactorial dyspnea because she could not go cardiopulmonary stress testing in may 2015 due to low Fev1. The combination of pulmonary function tests, CT scan of the chest, walking desaturation test and overnight desaturation test all she'll presence of exertional hypoxemia , restriction with low diffusion and presence of interstitial lung disease which is not any worse compared to last year 2014. In other words her interstitial lung disease is nonprogressive.  She also underwent cardiac testing with Dr. Gwenlyn Found. She had echocardiogram that was normal except for grade 1 diastolic dysfunction and had a normal stress Myoview test.  She continues to be dyspneic but it appears now that pulmonary rehabilitation is helping and may be her dyspnea somewhat  better but overall she still has class-3 exertional dyspnea is relieved by rest without associated chest pain  Of note, in terms of her rheumatoid arthritis she says her current cocktail of medications are not helping as much and the joint pain is worse. I suggested that she spike speak to a rheumatologist Dr Bronson Curb about therapy modificatoos   Test review:    CT chest 10/05/13 - ILD is stable.   Walking desat test 10/05/13:  RA resting: 93% RA ambulating: 85% 2L  ambulating: 96%   ONO on RA 10/06/13 sho desatruation. Mean pulse ox 82.7%, time < 88% at 3h and 14 sec.     Pulmonary function tests 01/12/14:  shows post bronchodilator FVC of 1.46 L or 70%. Total lung capacity of 2.6 L or 72%. DLCO 16.7/77%. PFTs are consistent with restriction and interstitial lung disease. PFT show decline in lungFVC and TLC but also coressponds to weight gain. DLCO is the same   #shortness of breath  - this is due to Pulmonary Fibrosis, weight and heart muscle not relaxing well when walking (called diastolic dysfunction) - use o2 with exertion and sleep  - aim to lose weight - continue pulmonary rehab  #Intersitial Lung Disease (ILD, aka pulmonary fibros) - this appears stable based on CT scans and breathing test - this could get worse in the future - there is no cure for this  - to prevent it from getting worse, Dr Estanislado Pandy and you  could consider a medicine called immuran or cellcept but you need to discuss with her about risks v benefits   #FOllowup  3 months iwht office spirometry (not by TammyWilson) during followup   OV 04/17/2014  Chief Complaint  Patient presents with  . Follow-up    Pt states her breathing is unchanged since last OV. Pt states she is more activte than before and feels she is using her O2 more only cause of an increase in activity. Pt c/o chest pressure.      Follow-up multifactorial dyspnea in the setting of obesity and interstitial lung disease, interstitial lung disease in the setting of rheumatoid arthritis  Last visit was 3 months ago. This is a routine 3 month follow-up. Some 2 months ago she saw a rheumatologist and has been started on CellCept along with her prednisone and Plaquenil. For the last 4 weeks she is noticing an improvement significant in her dyspnea which is now moderate in severity. It is still brought on by exertion and relieved by rest. There is associated fatigue. The most significant problem right now is  fatigue. This a generalized fatigue and present mostly when the day but also worsened by exertion and relieved by rest. She is tolerating a CellCept and prednisone well. No side effects directly related to these medications. In terms of obesity: She says she's lost 20 pounds of weight through diet and she aims to continue this progress  Walk test 185 feet 3 laps on room air using a walker:  06/06/2014 Acute OV  Complains of cough, increased SOB, some wheezing x for 2-3 weeks . Worse for last few days. No discolored mucus.  Denies f/c/s, n/v/d, purulent sputum, hemoptysis, PND, leg swelling.  Complains of back pain with radiation around to ribs.  On chronic narcotics for back pain-on MSIR  On prednisone 7.50m for RA On Cellcept. Seen by cards last week says that EKG was nml . Had stress test last summer that was neg.    Review of Systems  Constitutional: Negative  for fever and unexpected weight change.  HENT: Negative for congestion, dental problem, ear pain, nosebleeds, postnasal drip, rhinorrhea, sinus pressure, sneezing, sore throat and trouble swallowing.   Eyes: Negative for redness and itching.  Respiratory: no hemoptysis  Cardiovascular: Negative for palpitations and leg swelling.  Gastrointestinal: Negative for nausea and vomiting.  Genitourinary: Negative for dysuria.  Musculoskeletal: Negative for joint swelling.  Skin: Negative for rash.  Neurological: Negative for headaches.  Hematological: Does not bruise/bleed easily.  Psychiatric/Behavioral: Negative for dysphoric mood. The patient is not nervous/anxious.         Objective:   Physical Exam    chrnically ill looking, obese  HEENT: nl dentition, turbinates, and orophanx. Nl external ear canals without cough reflex   NECK :  without JVD/Nodes/TM/ nl carotid upstrokes bilaterally   LUNGS: no acc muscle use,  . CRACKLES at base +   CV:  RRR  no s3 or murmur or increase in P2, 1+ edema   ABD:  soft and  nontender with nl excursion in the supine position. No bruits or organomegaly, bowel sounds nl  MS:  warm without deformities, calf tenderness, cyanosis or clubbing. USES CANE  SKIN: warm and dry without lesions    NEURO:  alert, approp, no deficits        Assessment & Plan:

## 2014-06-06 NOTE — Addendum Note (Signed)
Addended by: Parke Poisson E on: 06/06/2014 12:41 PM   Modules accepted: Orders

## 2014-06-08 ENCOUNTER — Telehealth: Payer: Self-pay | Admitting: Internal Medicine

## 2014-06-08 DIAGNOSIS — J84115 Respiratory bronchiolitis interstitial lung disease: Secondary | ICD-10-CM | POA: Diagnosis not present

## 2014-06-08 DIAGNOSIS — M0570 Rheumatoid arthritis with rheumatoid factor of unspecified site without organ or systems involvement: Secondary | ICD-10-CM | POA: Diagnosis not present

## 2014-06-08 DIAGNOSIS — M170 Bilateral primary osteoarthritis of knee: Secondary | ICD-10-CM | POA: Diagnosis not present

## 2014-06-08 DIAGNOSIS — R300 Dysuria: Secondary | ICD-10-CM | POA: Diagnosis not present

## 2014-06-08 DIAGNOSIS — M5137 Other intervertebral disc degeneration, lumbosacral region: Secondary | ICD-10-CM | POA: Diagnosis not present

## 2014-06-08 DIAGNOSIS — Z79899 Other long term (current) drug therapy: Secondary | ICD-10-CM | POA: Diagnosis not present

## 2014-06-08 NOTE — Telephone Encounter (Signed)
Notes Recorded by Melvenia Needles, NP on 06/06/2014 at 3:50 PM Labs are ok  Cont on current regimen  Please contact office for sooner follow up if symptoms do not improve or worsen or seek emergency care  Result Note     Chronic changes    No sign of PNA     Please contact office for sooner follow up if symptoms do not improve or worsen or seek emergency care   --  I spoke with patient about results and she verbalized understanding and had no questions

## 2014-06-16 DIAGNOSIS — M549 Dorsalgia, unspecified: Secondary | ICD-10-CM | POA: Diagnosis not present

## 2014-06-19 ENCOUNTER — Other Ambulatory Visit: Payer: Self-pay | Admitting: Physical Medicine and Rehabilitation

## 2014-06-19 DIAGNOSIS — M5489 Other dorsalgia: Secondary | ICD-10-CM

## 2014-06-20 ENCOUNTER — Telehealth: Payer: Self-pay | Admitting: Internal Medicine

## 2014-06-20 NOTE — Telephone Encounter (Signed)
Spoke with pt. 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 States she will contact her airline and get the paperwork and bring to our office. Nothing further was needed.

## 2014-06-21 ENCOUNTER — Encounter: Payer: Medicare Other | Attending: Physical Medicine & Rehabilitation | Admitting: Physical Medicine & Rehabilitation

## 2014-06-21 ENCOUNTER — Encounter: Payer: Self-pay | Admitting: Physical Medicine & Rehabilitation

## 2014-06-21 VITALS — BP 120/67 | HR 90 | Resp 16

## 2014-06-21 DIAGNOSIS — M5416 Radiculopathy, lumbar region: Secondary | ICD-10-CM | POA: Diagnosis not present

## 2014-06-21 DIAGNOSIS — M069 Rheumatoid arthritis, unspecified: Secondary | ICD-10-CM | POA: Diagnosis not present

## 2014-06-21 DIAGNOSIS — M48061 Spinal stenosis, lumbar region without neurogenic claudication: Secondary | ICD-10-CM

## 2014-06-21 DIAGNOSIS — G894 Chronic pain syndrome: Secondary | ICD-10-CM | POA: Diagnosis not present

## 2014-06-21 DIAGNOSIS — M4806 Spinal stenosis, lumbar region: Secondary | ICD-10-CM | POA: Insufficient documentation

## 2014-06-21 DIAGNOSIS — Z79899 Other long term (current) drug therapy: Secondary | ICD-10-CM | POA: Diagnosis not present

## 2014-06-21 DIAGNOSIS — Z5181 Encounter for therapeutic drug level monitoring: Secondary | ICD-10-CM | POA: Insufficient documentation

## 2014-06-21 DIAGNOSIS — R5382 Chronic fatigue, unspecified: Secondary | ICD-10-CM | POA: Diagnosis not present

## 2014-06-21 DIAGNOSIS — M47816 Spondylosis without myelopathy or radiculopathy, lumbar region: Secondary | ICD-10-CM | POA: Diagnosis not present

## 2014-06-21 DIAGNOSIS — R5381 Other malaise: Secondary | ICD-10-CM | POA: Insufficient documentation

## 2014-06-21 DIAGNOSIS — M7062 Trochanteric bursitis, left hip: Secondary | ICD-10-CM | POA: Diagnosis not present

## 2014-06-21 DIAGNOSIS — E079 Disorder of thyroid, unspecified: Secondary | ICD-10-CM

## 2014-06-21 LAB — T4: T4, Total: 6.9 ug/dL (ref 4.5–12.0)

## 2014-06-21 LAB — CBC
HCT: 39.2 % (ref 36.0–46.0)
Hemoglobin: 12.7 g/dL (ref 12.0–15.0)
MCH: 30 pg (ref 26.0–34.0)
MCHC: 32.4 g/dL (ref 30.0–36.0)
MCV: 92.5 fL (ref 78.0–100.0)
MPV: 10.2 fL (ref 8.6–12.4)
Platelets: 183 10*3/uL (ref 150–400)
RBC: 4.24 MIL/uL (ref 3.87–5.11)
RDW: 14.7 % (ref 11.5–15.5)
WBC: 8.4 10*3/uL (ref 4.0–10.5)

## 2014-06-21 LAB — T4, FREE: FREE T4: 1.22 ng/dL (ref 0.80–1.80)

## 2014-06-21 LAB — BASIC METABOLIC PANEL
BUN: 23 mg/dL (ref 6–23)
CO2: 29 meq/L (ref 19–32)
Calcium: 8.9 mg/dL (ref 8.4–10.5)
Chloride: 100 mEq/L (ref 96–112)
Creat: 1.22 mg/dL — ABNORMAL HIGH (ref 0.50–1.10)
GLUCOSE: 61 mg/dL — AB (ref 70–99)
POTASSIUM: 3.9 meq/L (ref 3.5–5.3)
SODIUM: 143 meq/L (ref 135–145)

## 2014-06-21 LAB — T3: T3 TOTAL: 84.2 ng/dL (ref 80.0–204.0)

## 2014-06-21 LAB — T3, FREE: T3, Free: 2.9 pg/mL (ref 2.3–4.2)

## 2014-06-21 MED ORDER — DULOXETINE HCL 60 MG PO CPEP: 120.0000 mg | ORAL_CAPSULE | Freq: Every day | ORAL | Status: DC

## 2014-06-21 MED ORDER — ALLOPURINOL 100 MG PO TABS: 200.0000 mg | ORAL_TABLET | Freq: Every day | ORAL | Status: DC

## 2014-06-21 MED ORDER — MORPHINE SULFATE 15 MG PO TABS: 15.0000 mg | ORAL_TABLET | Freq: Four times a day (QID) | ORAL | Status: DC | PRN

## 2014-06-21 NOTE — Patient Instructions (Signed)
PLEASE CALL ME WITH ANY PROBLEMS OR QUESTIONS (#[phone]).

## 2014-06-21 NOTE — Progress Notes (Signed)
Subjective:    Patient ID: [name], female    DOB: January 24, 1941, 74 y.o.   MRN: [id number]  HPI   [name] is back regarding her chronic pain. Her knees have been much better since her injections. She is struggling with her lungs again. She would like to travel to [place] to see her daughter but is hesitant due to her breathing.   Her other complaint today is related to fatigue. She just doesn't feel that she has any energy. Her sleep is fair.   She maintains regular follow up with rheumatology and pulmonary medicine.    Pain Inventory Average Pain 5 Pain Right Now 3 My pain is intermittent, sharp, stabbing and aching  In the last 24 hours, has pain interfered with the following? General activity 7 Relation with others 7 Enjoyment of life 7 What TIME of day is your pain at its worst? evening Sleep (in general) Fair  Pain is worse with: walking, bending, standing and some activites Pain improves with: rest, heat/ice, therapy/exercise and medication Relief from Meds: 8  Mobility walk with assistance use a cane use a walker how many minutes can you walk? 5-10 ability to climb steps?  yes do you drive?  yes Do you have any goals in this area?  yes  Function disabled: date disabled . I need assistance with the following:  dressing, meal prep, household duties and shopping  Neuro/Psych bladder control problems weakness numbness tingling trouble walking spasms depression  Prior Studies Any changes since last visit?  yes  Physicians involved in your care Any changes since last visit?  no   Family History  Problem Relation Age of Onset  . Diabetes Mother   . Heart attack Mother   . Hypertension Father   . Lung cancer Father   . Diabetes Sister   . Diabetes Brother   . Hypertension Brother    History   Social History  . Marital Status: Married    Spouse Name: N/A    Number of Children: 6  . Years of Education: college   Occupational History    . retired    Social History Main Topics  . Smoking status: Never Smoker   . Smokeless tobacco: Never Used  . Alcohol Use: No  . Drug Use: No  . Sexual Activity: Not Currently   Other Topics Concern  . None   Social History Narrative   Patient consumes 2-3 cups coffee per day.   Past Surgical History  Procedure Laterality Date  . Abdominal hysterectomy    . Ovary surgery    . Shoulder surgery    . Brain surgery      Gamma knife 10/13. Needs repeat spring  '14  . Video bronchoscopy Bilateral 05/31/2013    Procedure: VIDEO BRONCHOSCOPY WITHOUT FLUORO;  Surgeon: Brand Males, MD;  Location: [facility];  Service: Cardiopulmonary;  Laterality: Bilateral;   Past Medical History  Diagnosis Date  . Diabetes mellitus   . Hypertension   . Thyroid disease   . Arthritis     endstage changes bilateral knees/bilateral ankles.   . High cholesterol   . 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 02/17/2012    Continue diplopia, left eye pain and left headaches.    . Depression, reactive   . RA (rheumatoid arthritis)     has been off methotreaxte since 10/13.  . Contusion of left knee     due to fall 1/14.  [name] URI (upper respiratory infection)   .  Clotting disorder   . Asthma   . Carotid artery occlusion   . Interstitial lung disease   . Morbid obesity   . Shortness of breath   . Family history of heart disease   . Chronic kidney disease    BP 120/67 mmHg  Pulse 90  Resp 16  SpO2 95%  Opioid Risk Score:   Fall Risk Score: Moderate Fall Risk (6-13 points) (pt previously educated through Physical therapy)  Review of Systems  Constitutional:       Weight gain  Respiratory: Positive for cough, shortness of breath and wheezing.        Respiratory infection  Cardiovascular: Positive for leg swelling.  Gastrointestinal: Positive for constipation.  Genitourinary: Positive for urgency.       Incontinence  Musculoskeletal: Positive for joint swelling and  gait problem.  Neurological: Positive for weakness and numbness.       Tingling Spasms   Psychiatric/Behavioral: Positive for dysphoric mood. The patient is nervous/anxious.   All other systems reviewed and are negative.      Objective:   Physical Exam  General: Alert and oriented x 3, No apparent distress. obese HEENT: Head is normocephalic, atraumatic, PERRLA, EOMI, sclera anicteric, oral mucosa pink and moist, dentition intact, ext ear canals clear,  Neck: Supple without JVD or lymphadenopathy  Heart: Reg rate and rhythm. No murmurs rubs or gallops  Chest: sob with gait, sometimes at rest. Phonates well  Abdomen: Soft, non-tender, non-distended, bowel sounds positive.  Extremities: No clubbing, cyanosis, 2+ edema. Pulses are 2+  Skin: Clean and intact without signs of breakdown  Neuro: Pt is cognitively appropriate with normal insight, memory, and awareness. Cranial nerves 2-12 are intact. Sensory exam is near normal in the legs... Reflexes are 2+ in all 4's. Fine motor coordination is intact. No tremors. Motor function is grossly 4 to 5/5 with pain inhbition. Musculoskeletal: low back and left hip less tender. She is slightly elevated in the left pelvis. She is antalgic with gait on the left. Crepitus in both knees. Knees less painful.  Gait is generally antalgic. Fingers lack flexion especially at DIP's. No catches or nodules palpated along volar aspects of fingers.  Psych: Pt's affect is appropriate. Pt is cooperative    Assessment & Plan:   Assessment:  1. Lumbar spinal stenosis with neurogenic claudication. Associated facet arthropathy  2. Depression.  3. Diabetes mellitus type 2 with polyneuropathy  4. Rheumatoid arthritis and osteoarthritis. Multiple joint involvement including knees.  5. Left greater troch bursitis  6. Interstitial lung disease  7. Multifactorial fatigue  Plan:  1. Continue with HEP for pulmonary rehab  2. Recommended observation of intermittent  sensory symptoms. Voltaren gel for fingers. Follow up with rheum as well.  3. MSO4 for breakthrough pain, one q8 prn #75---none needed today. She is requiring less  -continue tramadol for less severe pain up to 3-4x per day (did not need refills either)  4. Recommend follow up of thyroid testing, cbc, cmet for a basic fatigue work up---drawing today in the office.   5. Continue cymbalta to 25m bid for neuropathic and axial spine pain. Refill today  6. Follow up with me in 3 months. 30 minutes of face to face patient care time were spent during this visit. All questions were encouraged and answered.

## 2014-06-23 ENCOUNTER — Telehealth: Payer: Self-pay | Admitting: Physical Medicine & Rehabilitation

## 2014-06-23 NOTE — Telephone Encounter (Signed)
All labs are acceptable. Please let her know.  Thanks!

## 2014-06-23 NOTE — Telephone Encounter (Signed)
Notifed

## 2014-07-11 ENCOUNTER — Telehealth: Payer: Self-pay | Admitting: *Deleted

## 2014-07-11 NOTE — Telephone Encounter (Signed)
i am open to making that change but she should check to see if insurance will cover the ER form

## 2014-07-11 NOTE — Telephone Encounter (Signed)
Pt is wondering if her tramadol rx can be changed to an extended release version. She feels she needs something that will last through the night. As of know she says it takes her till around 11:00 am to get moving around because of her pain.

## 2014-07-12 MED ORDER — TRAMADOL HCL ER 100 MG PO TB24: 100.0000 mg | ORAL_TABLET | Freq: Two times a day (BID) | ORAL | Status: DC

## 2014-07-12 NOTE — Telephone Encounter (Signed)
She says that they have covered it in the past so she would like to make the switch.  We will have to call in what you prescribe.

## 2014-07-12 NOTE — Telephone Encounter (Signed)
See new rx

## 2014-07-12 NOTE — Telephone Encounter (Signed)
Ultram ER called to the pharmacy as prescribed. Notified Mrs [name] and instructed her to not take the short acting tramadol any longer with the long acting.  She verbalized understanding

## 2014-07-18 ENCOUNTER — Ambulatory Visit (INDEPENDENT_AMBULATORY_CARE_PROVIDER_SITE_OTHER): Payer: Medicare Other | Admitting: Internal Medicine

## 2014-07-18 ENCOUNTER — Encounter: Payer: Self-pay | Admitting: Internal Medicine

## 2014-07-18 VITALS — BP 134/90 | HR 100 | Temp 97.9°F | Ht 64.0 in | Wt 289.4 lb

## 2014-07-18 DIAGNOSIS — J029 Acute pharyngitis, unspecified: Secondary | ICD-10-CM | POA: Diagnosis not present

## 2014-07-18 DIAGNOSIS — J849 Interstitial pulmonary disease, unspecified: Secondary | ICD-10-CM | POA: Diagnosis not present

## 2014-07-18 DIAGNOSIS — R0600 Dyspnea, unspecified: Secondary | ICD-10-CM | POA: Diagnosis not present

## 2014-07-18 NOTE — Patient Instructions (Addendum)
#  I shortness of breath  - this is due to Pulmonary Fibrosis, weight and heart muscle not relaxing well when walking (called diastolic dysfunction), and chronic pain, obesity and physical deconditioning - unclear why you are worse - need to ensure ILD not worse  - but strongly suspect physical deconditioning is playing prominent role (easy tachycardia without desats on walking) - you will only feel full normal when you reduce to normal weight - use o2 with exertion and sleep  - aim to lose weight more - continue pulmonary rehab   #Sore throat   - ? Due to rheumatoid arthritis  - refer ENT  #Intersitial Lung Disease (ILD, aka pulmonary fibros) - this appears stable based on CT scans and breathing test as of aug 2015   - need to know current status  - repeat High Res CT chest -- I am glad so far you are tolerating cellcept and prednisone; continue this per Dr Estanislado Pandy because this medicine is supposed to prevent this from getting worse  #FOllowup  after CT chest and after ENT eval  1 month with NP Tammy

## 2014-07-18 NOTE — Progress Notes (Signed)
Subjective:    Patient ID: [name], female    DOB: 03-Jul-1940, 74 y.o.   MRN: [id number]  HPI   PCP Maximino Greenland, MD [place] - Dr Estanislado Pandy Cards - Dr Woody Seller at [facility]  #Rheumatoid Arthritis  [facility] used to be on remicade with MTx around 2009. go. Then on Orencia with methotrexate. Last few years MTXaline and finally due to worsening jt disesae MTx stopped and she is on actmera with prednisone, colchicine, Plaquenil along with other pain medications of Neurontin, Cymbalta, morphine and Tylenol    - #Morbid OBesity - BMI 47.4 - nov 2014  - Body mass index is 53.03 kg/(m^2).     #Dyspnea since fall 2014, class 3 - major life limiting symptoms - cPST - could not preform May 2015    #ILD  - - Walk test 185 feet x3 laps on room air today 04/06/2013. She only completed 2 laps and got very dyspneic and fatigued. The heart rate was 111. She desaturated to 88%  - ono 04/11/13 shows lowerst pulse ox 79% and total time </= 89% at 6 min and </= 88% at 3 min. She has very mild desat. Does not qualify for overnight oxygen  Pulmonary function test 04/28/2013 shows FEV1 1.7 L/74%. FEV1 1.46 L/81%. Ratio to 84/109%. Suggestive of restriction. Total lung capacity is 2.9 L/58% and consistent with restriction.   - CT Nov 2014: NSIP Pattern  - Bronch BAL early 2015: BAL shows lot of neutrophils and ddx is acut viral/bacterial infection ([place] had just completed abx Rx) or aspiration (please check on this). Main thing is no opportunisitc infection given her extensive immunosuppressive regimen.   Pulmonary function test 09/08/2013: Show stability. FVC 1.78 L/77%. FEV1 1.6 L/90%. Ratio of 91/118%. Still notices restriction. Total lung capacity 3.57/70% and confirms restriction. DLCO 12.5/51% and consistent with restrictive interstitial lung disease. If at all Fev1 and TLC are improved compared to Dec 2014  Walk test 10/04/13 is currently on 2 liters of oxygen at [facility].   During her initial 6MWT on 08/11/13 she de-saturated to 85% on room air.  2 liters kept her above 90%.   Walking desat test 10/05/13:  RA resting: 93% RA ambulating: 85% 2L ambulating: 96%    #Flare ups OV 07/18/2013 - #Acute bronchitis, RX with doxy and pred    OV 01/12/2014  Chief Complaint  Patient presents with  . Follow-up    Pt here after PFT's, CT and ONO. Pt states her breathing is unchanged. Pt states she is still doing pulmonary rehab. Pt states she gets SOB and CP at times when active. Pt denies cough.      Followup dyspnea in the setting of morbid obesity,  and rheumatoid arthritis related interstitial lung disease. Currently on prednisone, Plaquenil and actmera  This visit is to followup with series of tests that she underwent to evaluate for multifactorial dyspnea because she could not go cardiopulmonary stress testing in may 2015 due to low Fev1. The combination of pulmonary function tests, CT scan of the chest, walking desaturation test and overnight desaturation test all she'll presence of exertional hypoxemia , restriction with low diffusion and presence of interstitial lung disease which is not any worse compared to last year 2014. In other words her interstitial lung disease is nonprogressive.  She also underwent cardiac testing with Dr. Gwenlyn Found. She had echocardiogram that was normal except for grade 1 diastolic dysfunction and had a normal stress Myoview test.  She continues to  be dyspneic but it appears now that pulmonary rehabilitation is helping and may be her dyspnea somewhat better but overall she still has class-3 exertional dyspnea is relieved by rest without associated chest pain  Of note, in terms of her rheumatoid arthritis she says her current cocktail of medications are not helping as much and the joint pain is worse. I suggested that she spike speak to a rheumatologist Dr Bronson Curb about therapy modificatoos   Test review:    CT chest 10/05/13 - ILD is  stable.   Walking desat test 10/05/13:  RA resting: 93% RA ambulating: 85% 2L ambulating: 96%   ONO on RA 10/06/13 sho desatruation. Mean pulse ox 82.7%, time < 88% at 3h and 14 sec.     Pulmonary function tests 01/12/14:  shows post bronchodilator FVC of 1.46 L or 70%. Total lung capacity of 2.6 L or 72%. DLCO 16.7/77%. PFTs are consistent with restriction and interstitial lung disease. PFT show decline in lungFVC and TLC but also coressponds to weight gain. DLCO is the same   #shortness of breath  - this is due to Pulmonary Fibrosis, weight and heart muscle not relaxing well when walking (called diastolic dysfunction) - use o2 with exertion and sleep  - aim to lose weight - continue pulmonary rehab  #Intersitial Lung Disease (ILD, aka pulmonary fibros) - this appears stable based on CT scans and breathing test - this could get worse in the future - there is no cure for this  - to prevent it from getting worse, Dr Estanislado Pandy and you  could consider a medicine called immuran or cellcept but you need to discuss with her about risks v benefits   #FOllowup  3 months iwht office spirometry (not by TammyWilson) during followup   OV 04/17/2014  Chief Complaint  Patient presents with  . Follow-up    Pt states her breathing is unchanged since last OV. Pt states she is more activte than before and feels she is using her O2 more only cause of an increase in activity. Pt c/o chest pressure.      Follow-up multifactorial dyspnea in the setting of obesity and interstitial lung disease, interstitial lung disease in the setting of rheumatoid arthritis  Last visit was 3 months ago. This is a routine 3 month follow-up. Some 2 months ago she saw a rheumatologist and has been started on CellCept along with her prednisone and Plaquenil. For the last 4 weeks she is noticing an improvement significant in her dyspnea which is now moderate in severity. It is still brought on by exertion and relieved by  rest. There is associated fatigue. The most significant problem right now is fatigue. This a generalized fatigue and present mostly when the day but also worsened by exertion and relieved by rest. She is tolerating a CellCept and prednisone well. No side effects directly related to these medications. In terms of obesity: She says she's lost 20 pounds of weight through diet and she aims to continue this progress    06/06/2014 Acute OV  Complains of cough, increased SOB, some wheezing x for 2-3 weeks . Worse for last few days. No discolored mucus.  Denies f/c/s, n/v/d, purulent sputum, hemoptysis, PND, leg swelling.  Complains of back pain with radiation around to ribs.  On chronic narcotics for back pain-on MSIR  On prednisone 7.37m for RA On Cellcept. Seen by cards last week says that EKG was nml . Had stress test last summer that was neg.  OV 07/18/2014  Chief Complaint  Patient presents with  . Follow-up    Pt saw TP in 05/2014 for an acute visit. Pt stated she hasnt gotten back to baseline yet. Pt still c/o flight increase in SOB, chest congestion, dry cough, mild hoarseness.     Follow-up multifactorial dyspnea in the setting of obesity and interstitial lung disease nos in the setting of rheumatoid arthritis   - 74 year old. obese female on multiple pain medications, immune modulators along with Bactrim prophylaxis. She last saw me in November 2015 and at that time her dyspnea and improved somewhat coincidently after being started on immunomodulators of CellCept. Then she saw my nurse practitioner mid January 2016 with acute respiratory viral syndrome. This was associated with some atypical chest pain. Her prednisone was increased for a week as well as her Lasix. She reports for follow-up now. She tells me that she is overall no better. She has a lot of fatigue associated with joint pain and back pain. In addition she's having sore throat and heartburn and significant dyspnea. She feels  that her dyspnea has gotten worse again. This no fever or chills or hemoptysis. She is very poor historian and it's hard to sort out what exactly is going on  Off note, it is obvious that for the last week or so she says that her rheumatoid arthritis is acting up and in fact her hands are swollen compared to baseline     Walk test 185 feet 3 laps on room air using a cane: Did only 1 lap and then got very dyspneic. And she stopped. However did not desaturate below 91%. Peak heart rate is 121/min   Recent labs reviewed hemoglobin 12.7 g percent every third 2016 with a creatinine of 1.22 mg percent [baseline creatinine in January was 1.07 mg percent]  Chest x-ray January 2016 just showed chronic ILD changes without any significant change compared to baseline    Review of Systems  Constitutional: Negative for fever and unexpected weight change.  HENT: Negative for congestion, dental problem, ear pain, nosebleeds, postnasal drip, rhinorrhea, sinus pressure, sneezing, sore throat and trouble swallowing.   Eyes: Negative for redness and itching.  Respiratory: Positive for cough, chest tightness and shortness of breath. Negative for wheezing.   Cardiovascular: Negative for palpitations and leg swelling.  Gastrointestinal: Negative for nausea and vomiting.  Genitourinary: Negative for dysuria.  Musculoskeletal: Negative for joint swelling.  Skin: Negative for rash.  Neurological: Negative for headaches.  Hematological: Does not bruise/bleed easily.  Psychiatric/Behavioral: Negative for dysphoric mood. The patient is not nervous/anxious.     Current outpatient prescriptions:  .  ACCU-CHEK SMARTVIEW test strip, , Disp: , Rfl: 1 .  acetaminophen (TYLENOL) 500 MG tablet, Take 1,000 mg by mouth 2 (two) times daily as needed for pain. Takes with tramadol, Disp: , Rfl:  .  albuterol (PROVENTIL HFA;VENTOLIN HFA) 108 (90 BASE) MCG/ACT inhaler, Inhale 2 puffs into the lungs every 6 (six) hours as  needed for wheezing or shortness of breath., Disp: , Rfl:  .  allopurinol (ZYLOPRIM) 100 MG tablet, Take 2 tablets (200 mg total) by mouth daily., Disp: 60 tablet, Rfl: 4 .  aspirin EC 81 MG tablet, Take 81 mg by mouth daily., Disp: , Rfl:  .  atorvastatin (LIPITOR) 20 MG tablet, Take 10 mg by mouth daily. , Disp: , Rfl:  .  BENICAR HCT 40-25 MG per tablet, Take 1 tablet by mouth daily. , Disp: , Rfl: 0 .  Calcium Carb-Cholecalciferol (  CALCIUM + D3 PO), Take 2 tablets by mouth daily., Disp: , Rfl:  .  cholecalciferol (VITAMIN D) 1000 UNITS tablet, Take 2,000 Units by mouth daily., Disp: , Rfl:  .  colchicine 0.6 MG tablet, Take 0.6 mg by mouth daily as needed (GOUT)., Disp: , Rfl:  .  dextromethorphan (DELSYM) 30 MG/5ML liquid, Take by mouth as needed for cough., Disp: , Rfl:  .  diclofenac sodium (VOLTAREN) 1 % GEL, Apply 1 application topically 3 (three) times daily. To right knee, Disp: 3 Tube, Rfl: 4 .  DULoxetine (CYMBALTA) 60 MG capsule, Take 2 capsules (120 mg total) by mouth daily., Disp: 60 capsule, Rfl: 4 .  furosemide (LASIX) 20 MG tablet, Take 20 mg by mouth daily., Disp: , Rfl:  .  gabapentin (NEURONTIN) 600 MG tablet, Take 1 tablet (600 mg total) by mouth as directed. Take 2 tablet in the morning, 2 tablet in the middle of the day, and 3 tablets at night, Disp: 210 tablet, Rfl: 1 .  levothyroxine (SYNTHROID, LEVOTHROID) 50 MCG tablet, Take 1 tablet (50 mcg total) by mouth daily., Disp: 30 tablet, Rfl: 1 .  linagliptin (TRADJENTA) 5 MG TABS tablet, Take 5 mg by mouth daily., Disp: , Rfl:  .  metoprolol succinate (TOPROL-XL) 100 MG 24 hr tablet, Take 100 mg by mouth daily. Take with or immediately following a meal., Disp: , Rfl:  .  morphine (MSIR) 15 MG tablet, Take 1 tablet (15 mg total) by mouth every 6 (six) hours as needed., Disp: 75 tablet, Rfl: 0 .  Multiple Vitamin (MULTIVITAMIN WITH MINERALS) TABS, Take 1 tablet by mouth daily., Disp: , Rfl:   .  mycophenolate (CELLCEPT) 500  MG tablet, Take 500 mg by mouth 2 (two) times daily. , Disp: , Rfl:  .  OXYGEN-HELIUM IN, Inhale 2 L into the lungs at bedtime., Disp: , Rfl:  .  pantoprazole (PROTONIX) 40 MG tablet, take 1 tablet by mouth once daily 30 TO 60 MINUTES BEFORE 1ST MEAL OF THE DAY, Disp: 30 tablet, Rfl: 5 .  predniSONE (DELTASONE) 10 MG tablet, Take 7.5 mg by mouth daily with breakfast. , Disp: , Rfl:  .  Spacer/Aero-Holding Chambers (AEROCHAMBER MV) inhaler, Use as instructed, Disp: 1 each, Rfl: 0 .  sulfamethoxazole-trimethoprim (BACTRIM DS,SEPTRA DS) 800-160 MG per tablet, Take 1 tablet by mouth 3 (three) times a week., Disp: , Rfl:  .  traMADol (ULTRAM-ER) 100 MG 24 hr tablet, Take 1 tablet (100 mg total) by mouth 2 times daily at 12 noon and 4 pm., Disp: 60 tablet, Rfl: 1      Objective:   Physical Exam  Filed Vitals:   07/18/14 1321  BP: 134/90  Pulse: 100  Temp: 97.9 F (36.6 C)  TempSrc: Oral  Height: _0  (1.626 m)  Weight: 289 lb 6.4 oz (131.271 kg)  SpO2: 94%   Birth weight not on file Filed Weights   07/18/14 1321  Weight: 289 lb 6.4 oz (131.271 kg)      chrnically ill looking, obese  HEENT: nl dentition, turbinates, and orophanx. Nl external ear canals without cough reflex her voice appears hoarse    NECK :  without JVD/Nodes/TM/ nl carotid upstrokes bilaterally   LUNGS: no acc muscle use,  . CRACKLES at base +   CV:  RRR  no s3 or murmur or increase in P2, 1+ edema   ABD:  soft and nontender with nl excursion in the supine position. No bruits or organomegaly, bowel sounds nl  MS:  warm without deformities, calf tenderness, cyanosis or clubbing. USES CANE. Her hand joints are warm and more swollen than baseline   SKIN: warm and dry without lesions    NEURO:  alert, approp, no deficits         Assessment & Plan:     ICD-9-CM ICD-10-CM   1. Dyspnea 786.09 R06.00 CT Chest High Resolution  2. Sore throat 462 J02.9 Ambulatory referral to ENT  3. ILD (interstitial  lung disease) 515 J84.9 CT Chest High Resolution  #I shortness of breath  - this is due to Pulmonary Fibrosis, weight and heart muscle not relaxing well when walking (called diastolic dysfunction), and chronic pain, obesity and physical deconditioning - unclear why you are worse - need to ensure ILD not worse  - but strongly suspect physical deconditioning is playing prominent role (easy tachycardia without desats on walking) - you will only feel full normal when you reduce to normal weight - use o2 with exertion and sleep  - aim to lose weight more - continue pulmonary rehab   #Sore throat   - ? Due to rheumatoid arthritis  - refer ENT  #Intersitial Lung Disease (ILD, aka pulmonary fibros) - this appears stable based on CT scans and breathing test as of aug 2015   - need to know current status  - repeat High Res CT chest -- I am glad so far you are tolerating cellcept and prednisone; continue this per Dr Estanislado Pandy because this medicine is supposed to prevent this from getting worse  #FOllowup  after CT chest and after ENT eval  1 month with NP Tammy     Dr. Brand Males, M.D., [facility].C.P Pulmonary and Critical Care Medicine Staff Physician [place] [facility] Pager: [phone], If no answer or between  15:00h - 7:00h: call [phone]  07/18/2014 1:53 PM

## 2014-07-20 ENCOUNTER — Ambulatory Visit (INDEPENDENT_AMBULATORY_CARE_PROVIDER_SITE_OTHER): Payer: Medicare Other

## 2014-07-20 ENCOUNTER — Encounter: Payer: Self-pay | Admitting: Podiatry

## 2014-07-20 ENCOUNTER — Ambulatory Visit (INDEPENDENT_AMBULATORY_CARE_PROVIDER_SITE_OTHER): Payer: Medicare Other | Admitting: Podiatry

## 2014-07-20 ENCOUNTER — Ambulatory Visit (INDEPENDENT_AMBULATORY_CARE_PROVIDER_SITE_OTHER)
Admission: RE | Admit: 2014-07-20 | Discharge: 2014-07-20 | Disposition: A | Discharge status: Home / Self Care | Payer: Medicare Other | Source: Ambulatory Visit | Attending: Internal Medicine | Admitting: Internal Medicine

## 2014-07-20 ENCOUNTER — Ambulatory Visit: Payer: Medicare Other

## 2014-07-20 VITALS — BP 138/48 | HR 77 | Resp 16 | Ht 64.0 in | Wt 285.0 lb

## 2014-07-20 DIAGNOSIS — R52 Pain, unspecified: Secondary | ICD-10-CM | POA: Diagnosis not present

## 2014-07-20 DIAGNOSIS — M722 Plantar fascial fibromatosis: Secondary | ICD-10-CM | POA: Diagnosis not present

## 2014-07-20 DIAGNOSIS — J849 Interstitial pulmonary disease, unspecified: Secondary | ICD-10-CM

## 2014-07-20 DIAGNOSIS — S93401A Sprain of unspecified ligament of right ankle, initial encounter: Secondary | ICD-10-CM | POA: Diagnosis not present

## 2014-07-20 DIAGNOSIS — I2510 Atherosclerotic heart disease of native coronary artery without angina pectoris: Secondary | ICD-10-CM | POA: Diagnosis not present

## 2014-07-20 DIAGNOSIS — R0600 Dyspnea, unspecified: Secondary | ICD-10-CM

## 2014-07-20 DIAGNOSIS — J479 Bronchiectasis, uncomplicated: Secondary | ICD-10-CM | POA: Diagnosis not present

## 2014-07-20 MED ORDER — TRIAMCINOLONE ACETONIDE 10 MG/ML IJ SUSP
10.0000 mg | Freq: Once | INTRAMUSCULAR | Status: AC
  Administered 2014-07-20: 10 mg

## 2014-07-20 NOTE — Progress Notes (Signed)
Subjective:     Patient ID: [name], female   DOB: May 28, 1940, 74 y.o.   MRN: [id number]  HPI patient presents stating she's been getting some pain in both feet left over right and she was just concerned because she's a diabetic. Points the left plantar heel and in the right heel but a more moderate way on the right one. States it's been very intense on the bottom of the left heel for the last several weeks   Review of Systems  All other systems reviewed and are negative.      Objective:   Physical Exam  Constitutional: She is oriented to person, place, and time.  Cardiovascular: Intact distal pulses.   Musculoskeletal: Normal range of motion.  Neurological: She is oriented to person, place, and time.  Skin: Skin is warm.  Nursing note and vitals reviewed.  neurovascular status found to be intact with muscle strength adequate range of motion within normal limits and noted to have good vibratory and sharp [place]. Moderate depression of the arch bilateral with varicosities in the ankle and noted to have obesity which is certainly a complicating factor. Discomfort on the plantar aspect heel left over right with intense discomfort when palpated on the left heel     Assessment:     Diabetic who has flatfoot deformity and pain in the plantar heel left over right with inflammation and fluid buildup    Plan:     H&P and x-rays reviewed. Injected the plantar fascial left 3 mg Kenalog 5 mg Xylocaine and applied fascial brace given instructions on usage. Instructed on physical therapy and reappoint in the next 2 weeks

## 2014-07-20 NOTE — Progress Notes (Signed)
Subjective:    Patient ID: [name], female    DOB: Aug 13, 1940, 74 y.o.   MRN: [id number]  HPI Comments: Pt complains of foot pain in both feet left greater then right, for over 1 week.  Foot Pain  Diabetes      Review of Systems  Constitutional: Positive for unexpected weight change.       Pt states due to the breathing problems she has been on steroids which have increased her weight.  Respiratory: Positive for chest tightness.        Currently being evaluated for breathing difficulties.  All other systems reviewed and are negative.      Objective:   Physical Exam        Assessment & Plan:

## 2014-07-25 DIAGNOSIS — J029 Acute pharyngitis, unspecified: Secondary | ICD-10-CM | POA: Diagnosis not present

## 2014-07-25 DIAGNOSIS — K210 Gastro-esophageal reflux disease with esophagitis: Secondary | ICD-10-CM | POA: Diagnosis not present

## 2014-07-25 DIAGNOSIS — R499 Unspecified voice and resonance disorder: Secondary | ICD-10-CM | POA: Diagnosis not present

## 2014-07-27 DIAGNOSIS — H5203 Hypermetropia, bilateral: Secondary | ICD-10-CM | POA: Diagnosis not present

## 2014-07-27 DIAGNOSIS — H25013 Cortical age-related cataract, bilateral: Secondary | ICD-10-CM | POA: Diagnosis not present

## 2014-07-27 DIAGNOSIS — M1711 Unilateral primary osteoarthritis, right knee: Secondary | ICD-10-CM | POA: Diagnosis not present

## 2014-07-27 DIAGNOSIS — M069 Rheumatoid arthritis, unspecified: Secondary | ICD-10-CM | POA: Diagnosis not present

## 2014-07-27 DIAGNOSIS — H2513 Age-related nuclear cataract, bilateral: Secondary | ICD-10-CM | POA: Diagnosis not present

## 2014-07-27 DIAGNOSIS — H04123 Dry eye syndrome of bilateral lacrimal glands: Secondary | ICD-10-CM | POA: Diagnosis not present

## 2014-08-01 ENCOUNTER — Telehealth: Payer: Self-pay | Admitting: Internal Medicine

## 2014-08-01 NOTE — Telephone Encounter (Signed)
[name]  You are seeing her 08/21/14 she has Obesity, diast dyshn  , deconditionuing and RA relatd ILD. Tough problem. IN past I have bronched her without positive yield for opportunisitc infection. I think she desaturted easy too. Now more dyspneic. CT shows worsening ILD. We do not have a good path forward with her. Unable to lose weight. I think all you can do is to share prognosis. Wil need repeat HRCT in 3 months

## 2014-08-03 ENCOUNTER — Encounter: Payer: Self-pay | Admitting: Podiatry

## 2014-08-03 ENCOUNTER — Ambulatory Visit (INDEPENDENT_AMBULATORY_CARE_PROVIDER_SITE_OTHER): Payer: Medicare Other | Admitting: Podiatry

## 2014-08-03 VITALS — BP 142/88 | HR 69 | Resp 12

## 2014-08-03 DIAGNOSIS — M722 Plantar fascial fibromatosis: Secondary | ICD-10-CM | POA: Diagnosis not present

## 2014-08-03 MED ORDER — TRIAMCINOLONE ACETONIDE 10 MG/ML IJ SUSP
10.0000 mg | Freq: Once | INTRAMUSCULAR | Status: AC
  Administered 2014-08-03: 10 mg

## 2014-08-03 NOTE — Progress Notes (Signed)
Subjective:     Patient ID: [name], female   DOB: 1941-03-13, 74 y.o.   MRN: [id number]  HPI patient states my left heel is improved but still painful when palpated and it's still making it difficult for me to walk comfortably. States though she is very happy with where she's had   Review of Systems     Objective:   Physical Exam Neurovascular status intact with discomfort left plantar heel still present but improved from previous    Assessment:     Plantar fasciitis left still present and painful but improved    Plan:     Advised on physical therapy stretching exercises and reinjected the plantar fascia 3 mg Kenalog 5 mill grams Xylocaine and we'll see back as needed

## 2014-08-04 ENCOUNTER — Other Ambulatory Visit: Payer: Self-pay | Admitting: Internal Medicine

## 2014-08-04 DIAGNOSIS — M1712 Unilateral primary osteoarthritis, left knee: Secondary | ICD-10-CM | POA: Diagnosis not present

## 2014-08-09 DIAGNOSIS — M2550 Pain in unspecified joint: Secondary | ICD-10-CM | POA: Diagnosis not present

## 2014-08-09 DIAGNOSIS — N183 Chronic kidney disease, stage 3 (moderate): Secondary | ICD-10-CM | POA: Diagnosis not present

## 2014-08-09 DIAGNOSIS — E1122 Type 2 diabetes mellitus with diabetic chronic kidney disease: Secondary | ICD-10-CM | POA: Diagnosis not present

## 2014-08-09 DIAGNOSIS — I129 Hypertensive chronic kidney disease with stage 1 through stage 4 chronic kidney disease, or unspecified chronic kidney disease: Secondary | ICD-10-CM | POA: Diagnosis not present

## 2014-08-09 DIAGNOSIS — Z5181 Encounter for therapeutic drug level monitoring: Secondary | ICD-10-CM | POA: Diagnosis not present

## 2014-08-09 DIAGNOSIS — Z79899 Other long term (current) drug therapy: Secondary | ICD-10-CM | POA: Diagnosis not present

## 2014-08-09 DIAGNOSIS — N08 Glomerular disorders in diseases classified elsewhere: Secondary | ICD-10-CM | POA: Diagnosis not present

## 2014-08-09 DIAGNOSIS — R000 Tachycardia, unspecified: Secondary | ICD-10-CM | POA: Diagnosis not present

## 2014-08-21 ENCOUNTER — Ambulatory Visit (INDEPENDENT_AMBULATORY_CARE_PROVIDER_SITE_OTHER): Payer: Medicare Other | Admitting: Adult Health

## 2014-08-21 ENCOUNTER — Encounter: Payer: Self-pay | Admitting: Adult Health

## 2014-08-21 VITALS — BP 136/84 | HR 96 | Temp 97.9°F | Ht 64.0 in | Wt 288.8 lb

## 2014-08-21 DIAGNOSIS — J849 Interstitial pulmonary disease, unspecified: Secondary | ICD-10-CM

## 2014-08-21 DIAGNOSIS — Z23 Encounter for immunization: Secondary | ICD-10-CM

## 2014-08-21 DIAGNOSIS — R1310 Dysphagia, unspecified: Secondary | ICD-10-CM

## 2014-08-21 DIAGNOSIS — R0600 Dyspnea, unspecified: Secondary | ICD-10-CM

## 2014-08-21 MED ORDER — SULFAMETHOXAZOLE-TRIMETHOPRIM 800-160 MG PO TABS: 1.0000 | ORAL_TABLET | ORAL | Status: DC

## 2014-08-21 NOTE — Assessment & Plan Note (Signed)
Refer to GI  Cont on PPI  GERD diet

## 2014-08-21 NOTE — Patient Instructions (Addendum)
Follow up with [name] in 3 months  Repeat HRCT in 3 months .  Will refer to GI for swallow issues.  Order for [facility] with seat.  Prevnar vaccine today .  Refill for Septra sent to pharmacy, get future refills through rheumatology.  Wear Oxygen 2l/m with activiity and At bedtime   Please contact office for sooner follow up if symptoms do not improve or worsen or seek emergency care

## 2014-08-21 NOTE — Progress Notes (Signed)
Subjective:    Patient ID: [name], female    DOB: 03-Jul-1940, 74 y.o.   MRN: [id number]  HPI   PCP Maximino Greenland, MD [place] - Dr Estanislado Pandy Cards - Dr Woody Seller at [facility]  #Rheumatoid Arthritis  [facility] used to be on remicade with MTx around 2009. go. Then on Orencia with methotrexate. Last few years MTXaline and finally due to worsening jt disesae MTx stopped and she is on actmera with prednisone, colchicine, Plaquenil along with other pain medications of Neurontin, Cymbalta, morphine and Tylenol    - #Morbid OBesity - BMI 47.4 - nov 2014  - Body mass index is 53.03 kg/(m^2).     #Dyspnea since fall 2014, class 3 - major life limiting symptoms - cPST - could not preform May 2015    #ILD  - - Walk test 185 feet x3 laps on room air today 04/06/2013. She only completed 2 laps and got very dyspneic and fatigued. The heart rate was 111. She desaturated to 88%  - ono 04/11/13 shows lowerst pulse ox 79% and total time </= 89% at 6 min and </= 88% at 3 min. She has very mild desat. Does not qualify for overnight oxygen  Pulmonary function test 04/28/2013 shows FEV1 1.7 L/74%. FEV1 1.46 L/81%. Ratio to 84/109%. Suggestive of restriction. Total lung capacity is 2.9 L/58% and consistent with restriction.   - CT Nov 2014: NSIP Pattern  - Bronch BAL early 2015: BAL shows lot of neutrophils and ddx is acut viral/bacterial infection ([place] had just completed abx Rx) or aspiration (please check on this). Main thing is no opportunisitc infection given her extensive immunosuppressive regimen.   Pulmonary function test 09/08/2013: Show stability. FVC 1.78 L/77%. FEV1 1.6 L/90%. Ratio of 91/118%. Still notices restriction. Total lung capacity 3.57/70% and confirms restriction. DLCO 12.5/51% and consistent with restrictive interstitial lung disease. If at all Fev1 and TLC are improved compared to Dec 2014  Walk test 10/04/13 is currently on 2 liters of oxygen at [facility].   During her initial 6MWT on 08/11/13 she de-saturated to 85% on room air.  2 liters kept her above 90%.   Walking desat test 10/05/13:  RA resting: 93% RA ambulating: 85% 2L ambulating: 96%    #Flare ups OV 07/18/2013 - #Acute bronchitis, RX with doxy and pred    OV 01/12/2014  Chief Complaint  Patient presents with  . Follow-up    Pt here after PFT's, CT and ONO. Pt states her breathing is unchanged. Pt states she is still doing pulmonary rehab. Pt states she gets SOB and CP at times when active. Pt denies cough.      Followup dyspnea in the setting of morbid obesity,  and rheumatoid arthritis related interstitial lung disease. Currently on prednisone, Plaquenil and actmera  This visit is to followup with series of tests that she underwent to evaluate for multifactorial dyspnea because she could not go cardiopulmonary stress testing in may 2015 due to low Fev1. The combination of pulmonary function tests, CT scan of the chest, walking desaturation test and overnight desaturation test all she'll presence of exertional hypoxemia , restriction with low diffusion and presence of interstitial lung disease which is not any worse compared to last year 2014. In other words her interstitial lung disease is nonprogressive.  She also underwent cardiac testing with Dr. Gwenlyn Found. She had echocardiogram that was normal except for grade 1 diastolic dysfunction and had a normal stress Myoview test.  She continues to  be dyspneic but it appears now that pulmonary rehabilitation is helping and may be her dyspnea somewhat better but overall she still has class-3 exertional dyspnea is relieved by rest without associated chest pain  Of note, in terms of her rheumatoid arthritis she says her current cocktail of medications are not helping as much and the joint pain is worse. I suggested that she spike speak to a rheumatologist Dr Bronson Curb about therapy modificatoos   Test review:    CT chest 10/05/13 - ILD is  stable.   Walking desat test 10/05/13:  RA resting: 93% RA ambulating: 85% 2L ambulating: 96%   ONO on RA 10/06/13 sho desatruation. Mean pulse ox 82.7%, time < 88% at 3h and 14 sec.     Pulmonary function tests 01/12/14:  shows post bronchodilator FVC of 1.46 L or 70%. Total lung capacity of 2.6 L or 72%. DLCO 16.7/77%. PFTs are consistent with restriction and interstitial lung disease. PFT show decline in lungFVC and TLC but also coressponds to weight gain. DLCO is the same   #shortness of breath  - this is due to Pulmonary Fibrosis, weight and heart muscle not relaxing well when walking (called diastolic dysfunction) - use o2 with exertion and sleep  - aim to lose weight - continue pulmonary rehab  #Intersitial Lung Disease (ILD, aka pulmonary fibros) - this appears stable based on CT scans and breathing test - this could get worse in the future - there is no cure for this  - to prevent it from getting worse, Dr Estanislado Pandy and you  could consider a medicine called immuran or cellcept but you need to discuss with her about risks v benefits   #FOllowup  3 months iwht office spirometry (not by TammyWilson) during followup   OV 04/17/2014  Chief Complaint  Patient presents with  . Follow-up    Pt states her breathing is unchanged since last OV. Pt states she is more activte than before and feels she is using her O2 more only cause of an increase in activity. Pt c/o chest pressure.      Follow-up multifactorial dyspnea in the setting of obesity and interstitial lung disease, interstitial lung disease in the setting of rheumatoid arthritis  Last visit was 3 months ago. This is a routine 3 month follow-up. Some 2 months ago she saw a rheumatologist and has been started on CellCept along with her prednisone and Plaquenil. For the last 4 weeks she is noticing an improvement significant in her dyspnea which is now moderate in severity. It is still brought on by exertion and relieved by  rest. There is associated fatigue. The most significant problem right now is fatigue. This a generalized fatigue and present mostly when the day but also worsened by exertion and relieved by rest. She is tolerating a CellCept and prednisone well. No side effects directly related to these medications. In terms of obesity: She says she's lost 20 pounds of weight through diet and she aims to continue this progress    06/06/2014 Acute OV  Complains of cough, increased SOB, some wheezing x for 2-3 weeks . Worse for last few days. No discolored mucus.  Denies f/c/s, n/v/d, purulent sputum, hemoptysis, PND, leg swelling.  Complains of back pain with radiation around to ribs.  On chronic narcotics for back pain-on MSIR  On prednisone 7.37m for RA On Cellcept. Seen by cards last week says that EKG was nml . Had stress test last summer that was neg.  OV 07/18/2014  Chief Complaint  Patient presents with  . Follow-up    Pt saw TP in 05/2014 for an acute visit. Pt stated she hasnt gotten back to baseline yet. Pt still c/o flight increase in SOB, chest congestion, dry cough, mild hoarseness.     Follow-up multifactorial dyspnea in the setting of obesity and interstitial lung disease nos in the setting of rheumatoid arthritis   - 74 year old. obese female on multiple pain medications, immune modulators along with Bactrim prophylaxis. She last saw me in November 2015 and at that time her dyspnea and improved somewhat coincidently after being started on immunomodulators of CellCept. Then she saw my nurse practitioner mid January 2016 with acute respiratory viral syndrome. This was associated with some atypical chest pain. Her prednisone was increased for a week as well as her Lasix. She reports for follow-up now. She tells me that she is overall no better. She has a lot of fatigue associated with joint pain and back pain. In addition she's having sore throat and heartburn and significant dyspnea. She feels  that her dyspnea has gotten worse again. This no fever or chills or hemoptysis. She is very poor historian and it's hard to sort out what exactly is going on  Off note, it is obvious that for the last week or so she says that her rheumatoid arthritis is acting up and in fact her hands are swollen compared to baseline     Walk test 185 feet 3 laps on room air using a cane: Did only 1 lap and then got very dyspneic. And she stopped. However did not desaturate below 91%. Peak heart rate is 121/min   Recent labs reviewed hemoglobin 12.7 g percent every third 2016 with a creatinine of 1.22 mg percent [baseline creatinine in January was 1.07 mg percent]  Chest x-ray January 2016 just showed chronic ILD changes without any significant change compared to baseline   08/21/2014 Follow up : ILD related RA  Follow-up multifactorial dyspnea in the setting of obesity and interstitial lung disease nos in the setting of rheumatoid arthritis Pt returns for 1 month follow up . She continues to get winded easily . Wears out from joint pain , fatigue and dyspnea.  Last ov she was set up for HRCT and referral to ENT for sore throat.  Reports breathing is improved since last ov but report some increased throat congestion since last ov.  Did see ENT on 3/8 > protonix increased to BID.  On O2 with activity and At bedtime  -seems to help but hard to carry portable tank.  We discussed walker with seat/basket . She is not able to carry tank due to joint pain .  HRCT chest shows worsening ILD . We discussed these results.  She is on cellcept and pred through Rheumatology . She is suppose to be on Septra -ran out needs refill.  No hemoptysis, chest pain, orthopnea, increased edema or fever.  Still has trouble swallowing at times, feels food gets stuck despite PPI Twice daily       Review of Systems  Constitutional: Negative for fever and unexpected weight change.  HENT: Negative for congestion, dental problem, ear  pain, nosebleeds, postnasal drip, rhinorrhea, sinus pressure, sneezing, sore throat and trouble swallowing.   Eyes: Negative for redness and itching.  Respiratory: Positive for cough,  and shortness of breath. Negative for wheezing.   Cardiovascular: Negative for palpitations and leg swelling.  Gastrointestinal: Negative for nausea and vomiting.  Genitourinary: Negative  for dysuria.  Musculoskeletal: Negative for joint swelling.  Skin: Negative for rash.  Neurological: Negative for headaches.  Hematological: Does not bruise/bleed easily.  Psychiatric/Behavioral: Negative for dysphoric mood. The patient is not nervous/anxious.        Objective:   Physical Exam   chrnically ill looking, obese with cane   HEENT: nl dentition, turbinates, and orophanx. Nl external ear canals without cough reflex her voice appears hoarse    NECK :  without JVD/Nodes/TM/ nl carotid upstrokes bilaterally   LUNGS: no acc muscle use,  Faint CRACKLES at base +   CV:  RRR  no s3 or murmur or increase in P2, 1+ edema   ABD:  soft and nontender with nl excursion in the supine position. No bruits or organomegaly, bowel sounds nl  MS:  warm without deformities, calf tenderness, cyanosis or clubbing. USES CANE. Her hand joints are warm and more swollen than baseline   SKIN: warm and dry without lesions    NEURO:  alert, approp, no deficits         Assessment & Plan:

## 2014-08-21 NOTE — Assessment & Plan Note (Signed)
CT chest showed worsening ILD r/t RA  Dyspnea suspect is multifactoral with obesity , RA/joint issues, deconditioning and ILD .  Will continue to follow  Cont w/ O2 at 2l/m w/ act and At bedtime   Repeat CT in 3 months  Check swallow issues with GI  Plan  Follow up with [name] in 3 months  Repeat HRCT in 3 months .  Will refer to GI for swallow issues.  Order for [facility] with seat.  Prevnar vaccine today .  Refill for Septra sent to pharmacy, get future refills through rheumatology.  Wear Oxygen 2l/m with activiity and At bedtime   Please contact office for sooner follow up if symptoms do not improve or worsen or seek emergency care

## 2014-08-22 DIAGNOSIS — L84 Corns and callosities: Secondary | ICD-10-CM | POA: Diagnosis not present

## 2014-08-22 DIAGNOSIS — E119 Type 2 diabetes mellitus without complications: Secondary | ICD-10-CM | POA: Diagnosis not present

## 2014-08-22 DIAGNOSIS — M2012 Hallux valgus (acquired), left foot: Secondary | ICD-10-CM | POA: Diagnosis not present

## 2014-08-22 DIAGNOSIS — L602 Onychogryphosis: Secondary | ICD-10-CM | POA: Diagnosis not present

## 2014-08-23 ENCOUNTER — Encounter: Payer: Self-pay | Admitting: Gastroenterology

## 2014-08-31 ENCOUNTER — Encounter: Payer: Self-pay | Admitting: Podiatry

## 2014-08-31 ENCOUNTER — Ambulatory Visit: Payer: Medicare Other | Admitting: Gastroenterology

## 2014-08-31 ENCOUNTER — Ambulatory Visit (INDEPENDENT_AMBULATORY_CARE_PROVIDER_SITE_OTHER): Payer: Medicare Other | Admitting: Podiatry

## 2014-08-31 VITALS — BP 115/61 | HR 97 | Resp 16

## 2014-08-31 DIAGNOSIS — E1151 Type 2 diabetes mellitus with diabetic peripheral angiopathy without gangrene: Secondary | ICD-10-CM

## 2014-08-31 DIAGNOSIS — S93401A Sprain of unspecified ligament of right ankle, initial encounter: Secondary | ICD-10-CM

## 2014-08-31 DIAGNOSIS — M2010 Hallux valgus (acquired), unspecified foot: Secondary | ICD-10-CM | POA: Diagnosis not present

## 2014-08-31 NOTE — Progress Notes (Signed)
Subjective:     Patient ID: [name], female   DOB: 02-Dec-1940, 74 y.o.   MRN: [id number]  HPI patient presents stating she needs diabetic shoes due to her swelling and pain in her feet and her heel is feeling some better   Review of Systems     Objective:   Physical Exam Neurovascular status unchanged with quite a bit of edema in the ankle region bilateral +2 with varicosities noted and structural bunion deformity noted with long-term diabetes and diminished neurological sensation and mild diminishment in circulatory    Assessment:     At risk diabetic with abnormal foot structure and history of plantar fasciitis    Plan:     Scan for new diabetic shoes and we will get those back to her as soon as we can     `

## 2014-09-01 ENCOUNTER — Ambulatory Visit (INDEPENDENT_AMBULATORY_CARE_PROVIDER_SITE_OTHER): Payer: Medicare Other | Admitting: Gastroenterology

## 2014-09-01 ENCOUNTER — Encounter: Payer: Self-pay | Admitting: Gastroenterology

## 2014-09-01 VITALS — BP 140/90 | HR 88 | Ht 62.0 in | Wt 293.0 lb

## 2014-09-01 DIAGNOSIS — R1314 Dysphagia, pharyngoesophageal phase: Secondary | ICD-10-CM | POA: Diagnosis not present

## 2014-09-01 DIAGNOSIS — R1310 Dysphagia, unspecified: Secondary | ICD-10-CM

## 2014-09-01 NOTE — Addendum Note (Signed)
Addended by: Oda Kilts on: 09/01/2014 11:18 AM   Modules accepted: Orders

## 2014-09-01 NOTE — Patient Instructions (Signed)
We have scheduled you for a Endoscopy at [facility] on 09/14/2014 at 12pm Separate instructions have been given Cc Robyn Sanders,MD

## 2014-09-01 NOTE — Assessment & Plan Note (Signed)
Dysphasia it may be due to a fixed esophageal stricture. A non- Pacific motility disorder is also a consideration.  Finally, she could have a disorder in her swallowing mechanism.  Recommendations #1  Upper endoscopy with dilation as indicated #2 to consider swallowing study pending results of #1

## 2014-09-01 NOTE — Progress Notes (Signed)
  _                                                                                                                History of Present Illness:  [name]  Is a 73-year-old Afro-American female with morbid obesity ,  Diabetes, oxygen-dependent interstitial lung disease , referred at the request of Dr. Sanders for evaluation of dysphagia. She has a sensation of food getting stuck in her throat.  She claims she has had to have a Heimlich  Procedure for food impactions. At ENT examination she apparently had a large amount of phlegm in her throat and para- laryngeal area.   She denies pyrosis, per se.  She suffers from a chronic nonproductive cough.  She apparently underwent colonoscopy a couple of years ago and had small polyps.   Past Medical History  Diagnosis Date  . Diabetes mellitus   . Hypertension   . Thyroid disease   . Arthritis     endstage changes bilateral knees/bilateral ankles.   . High cholesterol   . 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 02/17/2012    Continue diplopia, left eye pain and left headaches.    . Depression, reactive   . RA (rheumatoid arthritis)     has been off methotreaxte since 10/13.  . Contusion of left knee     due to fall 1/14.  . URI (upper respiratory infection)   . Clotting disorder   . Asthma   . Carotid artery occlusion   . Interstitial lung disease   . Morbid obesity   . Shortness of breath   . Family history of heart disease   . Chronic kidney disease    Past Surgical History  Procedure Laterality Date  . Abdominal hysterectomy    . Ovary surgery    . Shoulder surgery Left   . Brain surgery      Gamma knife 10/13. Needs repeat spring  '14  . Video bronchoscopy Bilateral 05/31/2013    Procedure: VIDEO BRONCHOSCOPY WITHOUT FLUORO;  Surgeon: Murali Ramaswamy, MD;  Location: [facility];  Service: Cardiopulmonary;  Laterality: Bilateral;   family history includes Breast cancer in her sister; Diabetes  in her brother, mother, and sister; Gout in her brother; Heart attack in her mother; Hypertension in her brother and father; Kidney cancer in her brother; Kidney failure in her brother; Lung cancer in her father; Rheum arthritis in her maternal uncle; Uterine cancer in her daughter. Current Outpatient Prescriptions  Medication Sig Dispense Refill  . ACCU-CHEK SMARTVIEW test strip   1  . acetaminophen (TYLENOL) 500 MG tablet Take 1,000 mg by mouth 2 (two) times daily as needed for pain. Takes with tramadol    . albuterol (PROVENTIL HFA;VENTOLIN HFA) 108 (90 BASE) MCG/ACT inhaler Inhale 2 puffs into the lungs every 6 (six) hours as needed for wheezing or shortness of breath.    . allopurinol (ZYLOPRIM) 100 MG tablet Take 2 tablets (200 mg total) by mouth daily. 60 tablet 4  . aspirin   EC 81 MG tablet Take 81 mg by mouth daily.    . atorvastatin (LIPITOR) 20 MG tablet Take 10 mg by mouth daily.     . BENICAR HCT 40-25 MG per tablet Take 1 tablet by mouth daily.   0  . Calcium Carb-Cholecalciferol (CALCIUM + D3 PO) Take 2 tablets by mouth daily.    . cholecalciferol (VITAMIN D) 1000 UNITS tablet Take 2,000 Units by mouth daily.    . colchicine 0.6 MG tablet Take 0.6 mg by mouth daily as needed (GOUT).    . dextromethorphan (DELSYM) 30 MG/5ML liquid Take by mouth as needed for cough.    . diclofenac sodium (VOLTAREN) 1 % GEL Apply 1 application topically 3 (three) times daily. To right knee 3 Tube 4  . DULoxetine (CYMBALTA) 60 MG capsule Take 2 capsules (120 mg total) by mouth daily. 60 capsule 4  . furosemide (LASIX) 20 MG tablet Take 20 mg by mouth daily.    . gabapentin (NEURONTIN) 600 MG tablet Take 1 tablet (600 mg total) by mouth as directed. Take 2 tablet in the morning, 2 tablet in the middle of the day, and 3 tablets at night 210 tablet 1  . levothyroxine (SYNTHROID, LEVOTHROID) 50 MCG tablet Take 1 tablet (50 mcg total) by mouth daily. 30 tablet 1  . linagliptin (TRADJENTA) 5 MG TABS tablet  Take 5 mg by mouth daily.    . metoprolol succinate (TOPROL-XL) 100 MG 24 hr tablet Take 100 mg by mouth daily. Take with or immediately following a meal.    . morphine (MSIR) 15 MG tablet Take 1 tablet (15 mg total) by mouth every 6 (six) hours as needed. 75 tablet 0  . Multiple Vitamin (MULTIVITAMIN WITH MINERALS) TABS Take 1 tablet by mouth daily.    . mycophenolate (CELLCEPT) 500 MG tablet Take 500 mg by mouth 2 (two) times daily.     . OXYGEN-HELIUM IN Inhale 2 L into the lungs at bedtime. And on exertion    . pantoprazole (PROTONIX) 40 MG tablet Take 40 mg by mouth 2 (two) times daily before a meal.    . predniSONE (DELTASONE) 10 MG tablet Take 10 mg by mouth daily with breakfast.     . Spacer/Aero-Holding Chambers (AEROCHAMBER MV) inhaler Use as instructed 1 each 0  . sulfamethoxazole-trimethoprim (BACTRIM DS,SEPTRA DS) 800-160 MG per tablet Take 1 tablet by mouth 3 (three) times a week. 15 tablet 0  . traMADol (ULTRAM-ER) 100 MG 24 hr tablet Take 1 tablet (100 mg total) by mouth 2 times daily at 12 noon and 4 pm. 60 tablet 1   No current facility-administered medications for this visit.   Allergies as of 09/01/2014 - Review Complete 09/01/2014  Allergen Reaction Noted  . Codeine Swelling 09/11/2011  . Remicade [infliximab]  09/11/2011  . Zestril [lisinopril] Swelling 09/11/2011    reports that she has never smoked. She has never used smokeless tobacco. She reports that she does not drink alcohol or use illicit drugs.   Review of Systems:  She has dyspnea on exertion and limitations to ambulation because of arthritisPertinent positive and negative review of systems were noted in the above HPI section. All other review of systems were otherwise negative.  Vital signs were reviewed in today's medical record Physical Exam: General:  Obese female inno acute distress Skin: anicteric Head: Normocephalic and atraumatic Eyes:  sclerae anicteric, EOMI Ears: Normal auditory acuity Mouth:  No deformity or lesions Neck: Supple, no masses or thyromegaly Lungs: Clear   throughout to auscultation Heart: Regular rate and rhythm; no murmurs, rubs or bruits Abdomen: Soft, non tender and non distended. No masses, hepatosplenomegaly or hernias noted. Normal Bowel sounds Rectal:deferred Musculoskeletal: Symmetrical with no gross deformities  Skin: No lesions on visible extremities Pulses:  Normal pulses noted Extremities: No clubbing, cyanosis, edema or deformities noted Neurological: Alert oriented x 4, grossly nonfocal Cervical Nodes:  No significant cervical adenopathy Inguinal Nodes: No significant inguinal adenopathy Psychological:  Alert and cooperative. Normal mood and affect  See Assessment and Plan under Problem List

## 2014-09-06 DIAGNOSIS — R300 Dysuria: Secondary | ICD-10-CM | POA: Diagnosis not present

## 2014-09-06 DIAGNOSIS — Z79899 Other long term (current) drug therapy: Secondary | ICD-10-CM | POA: Diagnosis not present

## 2014-09-08 ENCOUNTER — Encounter (HOSPITAL_COMMUNITY): Payer: Self-pay | Admitting: *Deleted

## 2014-09-13 DIAGNOSIS — R499 Unspecified voice and resonance disorder: Secondary | ICD-10-CM | POA: Diagnosis not present

## 2014-09-13 DIAGNOSIS — K210 Gastro-esophageal reflux disease with esophagitis: Secondary | ICD-10-CM | POA: Diagnosis not present

## 2014-09-14 ENCOUNTER — Encounter (HOSPITAL_COMMUNITY): Payer: Self-pay | Admitting: *Deleted

## 2014-09-14 ENCOUNTER — Ambulatory Visit (HOSPITAL_COMMUNITY): Payer: Medicare Other | Admitting: Anesthesiology

## 2014-09-14 ENCOUNTER — Encounter (HOSPITAL_COMMUNITY)
Admission: RE | Disposition: A | Discharge status: Home / Self Care | Payer: Self-pay | Source: Ambulatory Visit | Attending: Gastroenterology

## 2014-09-14 ENCOUNTER — Ambulatory Visit (HOSPITAL_COMMUNITY)
Admission: RE | Admit: 2014-09-14 | Discharge: 2014-09-14 | Disposition: A | Discharge status: Home / Self Care | Payer: Medicare Other | Source: Ambulatory Visit | Attending: Gastroenterology | Admitting: Gastroenterology

## 2014-09-14 DIAGNOSIS — R1314 Dysphagia, pharyngoesophageal phase: Secondary | ICD-10-CM

## 2014-09-14 DIAGNOSIS — Z79899 Other long term (current) drug therapy: Secondary | ICD-10-CM | POA: Insufficient documentation

## 2014-09-14 DIAGNOSIS — I129 Hypertensive chronic kidney disease with stage 1 through stage 4 chronic kidney disease, or unspecified chronic kidney disease: Secondary | ICD-10-CM | POA: Diagnosis not present

## 2014-09-14 DIAGNOSIS — I6529 Occlusion and stenosis of unspecified carotid artery: Secondary | ICD-10-CM | POA: Diagnosis not present

## 2014-09-14 DIAGNOSIS — N189 Chronic kidney disease, unspecified: Secondary | ICD-10-CM | POA: Insufficient documentation

## 2014-09-14 DIAGNOSIS — Z6841 Body Mass Index (BMI) 40.0 and over, adult: Secondary | ICD-10-CM | POA: Insufficient documentation

## 2014-09-14 DIAGNOSIS — J849 Interstitial pulmonary disease, unspecified: Secondary | ICD-10-CM | POA: Insufficient documentation

## 2014-09-14 DIAGNOSIS — Z79891 Long term (current) use of opiate analgesic: Secondary | ICD-10-CM | POA: Diagnosis not present

## 2014-09-14 DIAGNOSIS — E119 Type 2 diabetes mellitus without complications: Secondary | ICD-10-CM | POA: Insufficient documentation

## 2014-09-14 DIAGNOSIS — K2970 Gastritis, unspecified, without bleeding: Secondary | ICD-10-CM | POA: Insufficient documentation

## 2014-09-14 DIAGNOSIS — J8410 Pulmonary fibrosis, unspecified: Secondary | ICD-10-CM | POA: Insufficient documentation

## 2014-09-14 DIAGNOSIS — F329 Major depressive disorder, single episode, unspecified: Secondary | ICD-10-CM | POA: Diagnosis not present

## 2014-09-14 DIAGNOSIS — J449 Chronic obstructive pulmonary disease, unspecified: Secondary | ICD-10-CM | POA: Insufficient documentation

## 2014-09-14 DIAGNOSIS — R1310 Dysphagia, unspecified: Secondary | ICD-10-CM

## 2014-09-14 DIAGNOSIS — E780 Pure hypercholesterolemia: Secondary | ICD-10-CM | POA: Diagnosis not present

## 2014-09-14 DIAGNOSIS — E079 Disorder of thyroid, unspecified: Secondary | ICD-10-CM | POA: Diagnosis not present

## 2014-09-14 DIAGNOSIS — Z9981 Dependence on supplemental oxygen: Secondary | ICD-10-CM | POA: Insufficient documentation

## 2014-09-14 DIAGNOSIS — M069 Rheumatoid arthritis, unspecified: Secondary | ICD-10-CM | POA: Diagnosis not present

## 2014-09-14 DIAGNOSIS — J45909 Unspecified asthma, uncomplicated: Secondary | ICD-10-CM | POA: Insufficient documentation

## 2014-09-14 DIAGNOSIS — Z7982 Long term (current) use of aspirin: Secondary | ICD-10-CM | POA: Diagnosis not present

## 2014-09-14 DIAGNOSIS — K219 Gastro-esophageal reflux disease without esophagitis: Secondary | ICD-10-CM | POA: Diagnosis not present

## 2014-09-14 DIAGNOSIS — R109 Unspecified abdominal pain: Secondary | ICD-10-CM | POA: Diagnosis not present

## 2014-09-14 HISTORY — PX: ESOPHAGOGASTRODUODENOSCOPY (EGD) WITH PROPOFOL: SHX5813

## 2014-09-14 HISTORY — DX: Chronic obstructive pulmonary disease, unspecified: J44.9

## 2014-09-14 LAB — GLUCOSE, CAPILLARY: Glucose-Capillary: 116 mg/dL — ABNORMAL HIGH (ref 70–99)

## 2014-09-14 SURGERY — ESOPHAGOGASTRODUODENOSCOPY (EGD) WITH PROPOFOL
Anesthesia: Monitor Anesthesia Care

## 2014-09-14 MED ORDER — SODIUM CHLORIDE 0.9 % IV SOLN: INTRAVENOUS | Status: DC

## 2014-09-14 MED ORDER — LIDOCAINE HCL 1 % IJ SOLN
INTRAMUSCULAR | Status: DC | PRN
  Administered 2014-09-14: 50 mg via INTRADERMAL

## 2014-09-14 MED ORDER — PROPOFOL INFUSION 10 MG/ML OPTIME
INTRAVENOUS | Status: DC | PRN
  Administered 2014-09-14: 140 ug/kg/min via INTRAVENOUS

## 2014-09-14 MED ORDER — LACTATED RINGERS IV SOLN
INTRAVENOUS | Status: DC
  Administered 2014-09-14: 1000 mL via INTRAVENOUS

## 2014-09-14 MED ORDER — PROPOFOL 10 MG/ML IV BOLUS
INTRAVENOUS | Status: AC
  Filled 2014-09-14: qty 20

## 2014-09-14 SURGICAL SUPPLY — 14 items

## 2014-09-14 NOTE — Transfer of Care (Signed)
Immediate Anesthesia Transfer of Care Note  Patient: [name]  Procedure(s) Performed: Procedure(s): ESOPHAGOGASTRODUODENOSCOPY (EGD) WITH PROPOFOL (N/A)  Patient Location: PACU and Endoscopy Unit  Anesthesia Type:MAC  Level of Consciousness: awake, oriented and patient cooperative  Airway & Oxygen Therapy: Patient Spontanous Breathing and Patient connected to nasal cannula oxygen  Post-op Assessment: Report given to RN and Post -op Vital signs reviewed and stable  Post vital signs: Reviewed and stable  Last Vitals:  Filed Vitals:   09/14/14 1235  BP:   Pulse: 89  Temp: 36.6 C  Resp: 16    Complications: No apparent anesthesia complications

## 2014-09-14 NOTE — Anesthesia Postprocedure Evaluation (Signed)
  Anesthesia Post-op Note  Patient: [name]  Procedure(s) Performed: Procedure(s) (LRB): ESOPHAGOGASTRODUODENOSCOPY (EGD) WITH PROPOFOL (N/A)  Patient Location: PACU  Anesthesia Type: MAC  Level of Consciousness: awake and alert   Airway and Oxygen Therapy: Patient Spontanous Breathing  Post-op Pain: mild  Post-op Assessment: Post-op Vital signs reviewed, Patient's Cardiovascular Status Stable, Respiratory Function Stable, Patent Airway and No signs of Nausea or vomiting  Last Vitals:  Filed Vitals:   09/14/14 1235  BP:   Pulse: 89  Temp: 36.6 C  Resp: 16    Post-op Vital Signs: stable   Complications: No apparent anesthesia complications

## 2014-09-14 NOTE — Anesthesia Preprocedure Evaluation (Signed)
Anesthesia Evaluation  Patient identified by MRN, date of birth, ID band Patient awake    Reviewed: Allergy & Precautions, NPO status , Patient's Chart, lab work & pertinent test results  Airway Mallampati: II  TM Distance: >3 FB Neck ROM: Full    Dental no notable dental hx. (+) Partial Lower, Partial Upper   Pulmonary asthma , COPD Pulmonary fibrosis breath sounds clear to auscultation  Pulmonary exam normal       Cardiovascular hypertension, Pt. on medications Rhythm:Regular Rate:Normal     Neuro/Psych negative neurological ROS  negative psych ROS   GI/Hepatic negative GI ROS, Neg liver ROS,   Endo/Other  diabetes, Type 2, Oral Hypoglycemic AgentsMorbid obesity  Renal/GU negative Renal ROS  negative genitourinary   Musculoskeletal  (+) Arthritis -, Rheumatoid disorders,    Abdominal   Peds negative pediatric ROS (+)  Hematology negative hematology ROS (+)   Anesthesia Other Findings   Reproductive/Obstetrics negative OB ROS                             Anesthesia Physical Anesthesia Plan  ASA: III  Anesthesia Plan: MAC   Post-op Pain Management:    Induction:   Airway Management Planned: Nasal Cannula  Additional Equipment:   Intra-op Plan:   Post-operative Plan:   Informed Consent: I have reviewed the patients History and Physical, chart, labs and discussed the procedure including the risks, benefits and alternatives for the proposed anesthesia with the patient or authorized representative who has indicated his/her understanding and acceptance.   Dental advisory given  Plan Discussed with: CRNA  Anesthesia Plan Comments:         Anesthesia Quick Evaluation

## 2014-09-14 NOTE — H&P (View-Only) (Signed)
_                                                                                                                History of Present Illness:  [name]  Is a 74 year old Afro-American female with morbid obesity ,  Diabetes, oxygen-dependent interstitial lung disease , referred at the request of Dr. Baird Cancer for evaluation of dysphagia. She has a sensation of food getting stuck in her throat.  She claims she has had to have a Heimlich  Procedure for food impactions. At ENT examination she apparently had a large amount of phlegm in her throat and para- laryngeal area.   She denies pyrosis, per se.  She suffers from a chronic nonproductive cough.  She apparently underwent colonoscopy a couple of years ago and had small polyps.   Past Medical History  Diagnosis Date  . Diabetes mellitus   . Hypertension   . Thyroid disease   . Arthritis     endstage changes bilateral knees/bilateral ankles.   . High cholesterol   . 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 02/17/2012    Continue diplopia, left eye pain and left headaches.    . Depression, reactive   . RA (rheumatoid arthritis)     has been off methotreaxte since 10/13.  . Contusion of left knee     due to fall 1/14.  [name] URI (upper respiratory infection)   . Clotting disorder   . Asthma   . Carotid artery occlusion   . Interstitial lung disease   . Morbid obesity   . Shortness of breath   . Family history of heart disease   . Chronic kidney disease    Past Surgical History  Procedure Laterality Date  . Abdominal hysterectomy    . Ovary surgery    . Shoulder surgery Left   . Brain surgery      Gamma knife 10/13. Needs repeat spring  '14  . Video bronchoscopy Bilateral 05/31/2013    Procedure: VIDEO BRONCHOSCOPY WITHOUT FLUORO;  Surgeon: Brand Males, MD;  Location: [facility];  Service: Cardiopulmonary;  Laterality: Bilateral;   family history includes Breast cancer in her sister; Diabetes  in her brother, mother, and sister; Gout in her brother; Heart attack in her mother; Hypertension in her brother and father; Kidney cancer in her brother; Kidney failure in her brother; Lung cancer in her father; Rheum arthritis in her maternal uncle; Uterine cancer in her daughter. Current Outpatient Prescriptions  Medication Sig Dispense Refill  . ACCU-CHEK SMARTVIEW test strip   1  . acetaminophen (TYLENOL) 500 MG tablet Take 1,000 mg by mouth 2 (two) times daily as needed for pain. Takes with tramadol    . albuterol (PROVENTIL HFA;VENTOLIN HFA) 108 (90 BASE) MCG/ACT inhaler Inhale 2 puffs into the lungs every 6 (six) hours as needed for wheezing or shortness of breath.    . allopurinol (ZYLOPRIM) 100 MG tablet Take 2 tablets (200 mg total) by mouth daily. 60 tablet 4  . aspirin  EC 81 MG tablet Take 81 mg by mouth daily.    [name] atorvastatin (LIPITOR) 20 MG tablet Take 10 mg by mouth daily.     [name] BENICAR HCT 40-25 MG per tablet Take 1 tablet by mouth daily.   0  . Calcium Carb-Cholecalciferol (CALCIUM + D3 PO) Take 2 tablets by mouth daily.    . cholecalciferol (VITAMIN D) 1000 UNITS tablet Take 2,000 Units by mouth daily.    . colchicine 0.6 MG tablet Take 0.6 mg by mouth daily as needed (GOUT).    [name] dextromethorphan (DELSYM) 30 MG/5ML liquid Take by mouth as needed for cough.    . diclofenac sodium (VOLTAREN) 1 % GEL Apply 1 application topically 3 (three) times daily. To right knee 3 Tube 4  . DULoxetine (CYMBALTA) 60 MG capsule Take 2 capsules (120 mg total) by mouth daily. 60 capsule 4  . furosemide (LASIX) 20 MG tablet Take 20 mg by mouth daily.    [name] gabapentin (NEURONTIN) 600 MG tablet Take 1 tablet (600 mg total) by mouth as directed. Take 2 tablet in the morning, 2 tablet in the middle of the day, and 3 tablets at night 210 tablet 1  . levothyroxine (SYNTHROID, LEVOTHROID) 50 MCG tablet Take 1 tablet (50 mcg total) by mouth daily. 30 tablet 1  . linagliptin (TRADJENTA) 5 MG TABS tablet  Take 5 mg by mouth daily.    . metoprolol succinate (TOPROL-XL) 100 MG 24 hr tablet Take 100 mg by mouth daily. Take with or immediately following a meal.    . morphine (MSIR) 15 MG tablet Take 1 tablet (15 mg total) by mouth every 6 (six) hours as needed. 75 tablet 0  . Multiple Vitamin (MULTIVITAMIN WITH MINERALS) TABS Take 1 tablet by mouth daily.    . mycophenolate (CELLCEPT) 500 MG tablet Take 500 mg by mouth 2 (two) times daily.     . OXYGEN-HELIUM IN Inhale 2 L into the lungs at bedtime. And on exertion    . pantoprazole (PROTONIX) 40 MG tablet Take 40 mg by mouth 2 (two) times daily before a meal.    . predniSONE (DELTASONE) 10 MG tablet Take 10 mg by mouth daily with breakfast.     . Spacer/Aero-Holding Chambers (AEROCHAMBER MV) inhaler Use as instructed 1 each 0  . sulfamethoxazole-trimethoprim (BACTRIM DS,SEPTRA DS) 800-160 MG per tablet Take 1 tablet by mouth 3 (three) times a week. 15 tablet 0  . traMADol (ULTRAM-ER) 100 MG 24 hr tablet Take 1 tablet (100 mg total) by mouth 2 times daily at 12 noon and 4 pm. 60 tablet 1   No current facility-administered medications for this visit.   Allergies as of 09/01/2014 - Review Complete 09/01/2014  Allergen Reaction Noted  . Codeine Swelling 09/11/2011  . Remicade [infliximab]  09/11/2011  . Zestril [lisinopril] Swelling 09/11/2011    reports that she has never smoked. She has never used smokeless tobacco. She reports that she does not drink alcohol or use illicit drugs.   Review of Systems:  She has dyspnea on exertion and limitations to ambulation because of arthritisPertinent positive and negative review of systems were noted in the above HPI section. All other review of systems were otherwise negative.  Vital signs were reviewed in today's medical record Physical Exam: General:  Obese female inno acute distress Skin: anicteric Head: Normocephalic and atraumatic Eyes:  sclerae anicteric, EOMI Ears: Normal auditory acuity Mouth:  No deformity or lesions Neck: Supple, no masses or thyromegaly Lungs: Clear  throughout to auscultation Heart: Regular rate and rhythm; no murmurs, rubs or bruits Abdomen: Soft, non tender and non distended. No masses, hepatosplenomegaly or hernias noted. Normal Bowel sounds Rectal:deferred Musculoskeletal: Symmetrical with no gross deformities  Skin: No lesions on visible extremities Pulses:  Normal pulses noted Extremities: No clubbing, cyanosis, edema or deformities noted Neurological: Alert oriented x 4, grossly nonfocal Cervical Nodes:  No significant cervical adenopathy Inguinal Nodes: No significant inguinal adenopathy Psychological:  Alert and cooperative. Normal mood and affect  See Assessment and Plan under Problem List

## 2014-09-14 NOTE — Discharge Instructions (Signed)
Gastrointestinal Endoscopy, Care After Refer to this sheet in the next few weeks. These instructions provide you with information on caring for yourself after your procedure. Your caregiver may also give you more specific instructions. Your treatment has been planned according to current medical practices, but problems sometimes occur. Call your caregiver if you have any problems or questions after your procedure. HOME CARE INSTRUCTIONS  If you were given medicine to help you relax (sedative), do not drive, operate machinery, or sign important documents for 24 hours.  Avoid alcohol and hot or warm beverages for the first 24 hours after the procedure.  Only take over-the-counter or prescription medicines for pain, discomfort, or fever as directed by your caregiver. You may resume taking your normal medicines unless your caregiver tells you otherwise. Ask your caregiver when you may resume taking medicines that may cause bleeding, such as aspirin, clopidogrel, or warfarin.  You may return to your normal diet and activities on the day after your procedure, or as directed by your caregiver. Walking may help to reduce any bloated feeling in your abdomen.  Drink enough fluids to keep your urine clear or pale yellow.  You may gargle with salt water if you have a sore throat. SEEK IMMEDIATE MEDICAL CARE IF:  You have severe nausea or vomiting.  You have severe abdominal pain, abdominal cramps that last longer than 6 hours, or abdominal swelling (distention).  You have severe shoulder or back pain.  You have trouble swallowing.  You have shortness of breath, your breathing is shallow, or you are breathing faster than normal.  You have a fever or a rapid heartbeat.  You vomit blood or material that looks like coffee grounds.  You have bloody, black, or tarry stools. MAKE SURE YOU:  Understand these instructions.  Will watch your condition.  Will get help right away if you are not doing  well or get worse. Document Released: 12/18/2003 Document Revised: 09/19/2013 Document Reviewed: 08/05/2011 [facility] Patient Information 2015 [place], [place]. This information is not intended to replace advice given to you by your health care provider. Make sure you discuss any questions you have with your health care provider.

## 2014-09-14 NOTE — Interval H&P Note (Signed)
History and Physical Interval Note:  09/14/2014 12:00 PM  [name]  has presented today for surgery, with the diagnosis of abdominal pain  The various methods of treatment have been discussed with the patient and family. After consideration of risks, benefits and other options for treatment, the patient has consented to  Procedure(s): ESOPHAGOGASTRODUODENOSCOPY (EGD) WITH PROPOFOL (N/A) as a surgical intervention .  The patient's history has been reviewed, patient examined, no change in status, stable for surgery.  I have reviewed the patient's chart and labs.  Questions were answered to the patient's satisfaction.    The recent H&P (dated *09/01/14**) was reviewed, the patient was examined and there is no change in the patients condition since that H&P was completed.   Erskine Emery  09/14/2014, 12:00 PM    Erskine Emery

## 2014-09-15 ENCOUNTER — Encounter (HOSPITAL_COMMUNITY): Payer: Self-pay | Admitting: Gastroenterology

## 2014-09-15 NOTE — Op Note (Signed)
[facility] [place], [address]   ENDOSCOPY PROCEDURE REPORT  PATIENT: Joan, Mcdaniel  MR#: [id number] BIRTHDATE: 10-Jun-1940 , 73  yrs. old GENDER: female ENDOSCOPIST: [name], MD REFERRED BY:  Glendale Chard, M.D. PROCEDURE DATE:  09/14/2014 PROCEDURE:  EGD w/ biopsy ASA CLASS:     Class III INDICATIONS:  dysphagia. MEDICATIONS: Monitored anesthesia care TOPICAL ANESTHETIC:  DESCRIPTION OF PROCEDURE: After the risks benefits and alternatives of the procedure were thoroughly explained, informed consent was obtained.  The Dixon [id number] endoscope was introduced through the mouth and advanced to the second portion of the duodenum , Without limitations.  The instrument was slowly withdrawn as the mucosa was fully examined.    The GE junction was irregular with several islands of gastric appearing mucosa extending about 1 cm proximally.  Biopsies were taken to rule out Barrett's esophagus. There was streaky gastritis in the gastric antrum.   Except for the findings listed, the EGD was otherwise normal.  Retroflexed views revealed no abnormalities.     The scope was then withdrawn from the patient and the procedure completed.  COMPLICATIONS: There were no immediate complications.  ENDOSCOPIC IMPRESSION: 1.   The GE junction was irregular with several islands of gastric appearing mucosa extending about 1 cm proximally.  Biopsies were taken to rule out Barrett's esophagus. There was streaky gastritis in the gastric antrum 2.   EGD was otherwise normal  RECOMMENDATIONS: Await pathology results modified barium swallow to evaluate swallowing function  REPEAT EXAM:  eSigned:  [name], MD 09/14/2014 12:39 PM    CC:

## 2014-09-19 ENCOUNTER — Encounter: Payer: Self-pay | Admitting: Gastroenterology

## 2014-09-20 ENCOUNTER — Other Ambulatory Visit: Payer: Self-pay | Admitting: Physical Medicine & Rehabilitation

## 2014-09-20 ENCOUNTER — Other Ambulatory Visit: Payer: Self-pay

## 2014-09-20 ENCOUNTER — Encounter: Payer: Self-pay | Admitting: Physical Medicine & Rehabilitation

## 2014-09-20 ENCOUNTER — Encounter: Payer: Medicare Other | Attending: Physical Medicine & Rehabilitation | Admitting: Physical Medicine & Rehabilitation

## 2014-09-20 DIAGNOSIS — M4806 Spinal stenosis, lumbar region: Secondary | ICD-10-CM | POA: Insufficient documentation

## 2014-09-20 DIAGNOSIS — M47816 Spondylosis without myelopathy or radiculopathy, lumbar region: Secondary | ICD-10-CM | POA: Diagnosis not present

## 2014-09-20 DIAGNOSIS — E1140 Type 2 diabetes mellitus with diabetic neuropathy, unspecified: Secondary | ICD-10-CM

## 2014-09-20 DIAGNOSIS — R5382 Chronic fatigue, unspecified: Secondary | ICD-10-CM

## 2014-09-20 DIAGNOSIS — Z5181 Encounter for therapeutic drug level monitoring: Secondary | ICD-10-CM

## 2014-09-20 DIAGNOSIS — M1712 Unilateral primary osteoarthritis, left knee: Secondary | ICD-10-CM

## 2014-09-20 DIAGNOSIS — M1711 Unilateral primary osteoarthritis, right knee: Secondary | ICD-10-CM

## 2014-09-20 DIAGNOSIS — M5416 Radiculopathy, lumbar region: Secondary | ICD-10-CM | POA: Diagnosis not present

## 2014-09-20 DIAGNOSIS — G894 Chronic pain syndrome: Secondary | ICD-10-CM | POA: Insufficient documentation

## 2014-09-20 DIAGNOSIS — M069 Rheumatoid arthritis, unspecified: Secondary | ICD-10-CM | POA: Diagnosis not present

## 2014-09-20 DIAGNOSIS — Z79899 Other long term (current) drug therapy: Secondary | ICD-10-CM

## 2014-09-20 DIAGNOSIS — M48061 Spinal stenosis, lumbar region without neurogenic claudication: Secondary | ICD-10-CM

## 2014-09-20 DIAGNOSIS — M7062 Trochanteric bursitis, left hip: Secondary | ICD-10-CM | POA: Diagnosis not present

## 2014-09-20 DIAGNOSIS — J849 Interstitial pulmonary disease, unspecified: Secondary | ICD-10-CM

## 2014-09-20 DIAGNOSIS — E079 Disorder of thyroid, unspecified: Secondary | ICD-10-CM

## 2014-09-20 MED ORDER — TRAMADOL HCL ER 100 MG PO TB24: 100.0000 mg | ORAL_TABLET | Freq: Two times a day (BID) | ORAL | Status: DC

## 2014-09-20 MED ORDER — MORPHINE SULFATE 15 MG PO TABS: 15.0000 mg | ORAL_TABLET | Freq: Four times a day (QID) | ORAL | Status: DC | PRN

## 2014-09-20 NOTE — Progress Notes (Signed)
Subjective:    Patient ID: [name], female    DOB: 10-14-1940, 74 y.o.   MRN: [id number]  HPI   Doloris is here in follow up of her chronic pain. She has been staying fairly steady with her pain until recently when her pain increased. She is taking the tramadol ER 112m bid with prn mso4 for breakthrough pain up to 2x daily. Her knees in particular have bothered her. More recently, the left knee has been worse. She has been using her voltaren gel, a knee sleeve, ice, etc. Her exercises have been curtailed due to increased pain.   She's had further issues with her kidneys and decline in her renal function. SHe doesn't feel that the cellcept is working, but she hasn't discussed with rheum.  Everything  has been weighing heavily on her from an emotional standtpoint asa well.     Pain Inventory Average Pain 6 Pain Right Now 4 My pain is sharp, burning and aching  In the last 24 hours, has pain interfered with the following? General activity 7 Relation with others 7 Enjoyment of life 7 What TIME of day is your pain at its worst? morning Sleep (in general) Poor  Pain is worse with: walking, bending, sitting, standing and some activites Pain improves with: rest, heat/ice and medication Relief from Meds: 5  Mobility use a walker use a wheelchair  Function not employed: date last employed . I need assistance with the following:  meal prep, household duties and shopping  Neuro/Psych bladder control problems weakness numbness tingling trouble walking depression  Prior Studies Any changes since last visit?  no  Physicians involved in your care Any changes since last visit?  no   Family History  Problem Relation Age of Onset  . Diabetes Mother   . Heart attack Mother   . Hypertension Father   . Lung cancer Father   . Diabetes Sister   . Diabetes Brother   . Hypertension Brother   . Kidney cancer Brother   . Uterine cancer Daughter   . Breast cancer  Sister   . Rheum arthritis Maternal Uncle   . Gout Brother   . Kidney failure Brother     x 5   History   Social History  . Marital Status: Married    Spouse Name: N/A  . Number of Children: 6  . Years of Education: college   Occupational History  . 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 Smoking status: Never Smoker   . Smokeless tobacco: Never Used  . Alcohol Use: No  . Drug Use: No  . Sexual Activity: Not Currently   Other Topics Concern  . None   Social History Narrative   Patient consumes 2-3 cups coffee per day.   Past Surgical History  Procedure Laterality Date  . Abdominal hysterectomy    . Ovary surgery    . Shoulder surgery Left   . Brain surgery      Gamma knife 10/13. Needs repeat spring  '14  . Video bronchoscopy Bilateral 05/31/2013    Procedure: VIDEO BRONCHOSCOPY WITHOUT FLUORO;  Surgeon: MBrand Males MD;  [facility]  Service: Cardiopulmonary;  Laterality: Bilateral;  . Video bronscoscopy  2000    lung  . Tonsillectomy  age 74 . Esophagogastroduodenoscopy (egd) with propofol N/A 09/14/2014    Procedure: ESOPHAGOGASTRODUODENOSCOPY (EGD) WITH PROPOFOL;  Surgeon: RInda Castle MD;  Location: [facility];  Service: Endoscopy;  Laterality: N/A;  Past Medical History  Diagnosis Date  . Diabetes mellitus   . Hypertension   . Thyroid disease   . Arthritis     endstage changes bilateral knees/bilateral ankles.   . High cholesterol   . 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 02/17/2012    Continue diplopia, left eye pain and left headaches.    . Depression, reactive   . RA (rheumatoid arthritis)     has been off methotreaxte since 10/13.  . Contusion of left knee     due to fall 1/14.  [name] URI (upper respiratory infection)   . Clotting disorder     pt denies this  . Asthma   . Carotid artery occlusion   . Interstitial lung disease   . Morbid obesity   . Shortness of breath   . Family history of  heart disease   . Chronic kidney disease   . COPD (chronic obstructive pulmonary disease)     pulmonary fibrosis   There were no vitals taken for this visit.  Opioid Risk Score:   Fall Risk Score: Moderate Fall Risk (6-13 points) (previously educated and given handout)`1  Depression screen PHQ 2/9  Depression screen [facility] 2/9 09/20/2014 08/10/2013  Decreased Interest 3 -  Down, Depressed, Hopeless 1 1  PHQ - 2 Score 4 1  Altered sleeping 0 -  Tired, decreased energy 3 -  Change in appetite 3 -  Feeling bad or failure about yourself  3 -  Trouble concentrating 1 -  Moving slowly or fidgety/restless 2 -  Suicidal thoughts 0 -  PHQ-9 Score 16 -    Review of Systems  Genitourinary:       Bladder control problems  Musculoskeletal: Positive for arthralgias and gait problem.  Neurological: Positive for weakness and numbness.       Tingling  Psychiatric/Behavioral: Positive for dysphoric mood.  All other systems reviewed and are negative.      Objective:   Physical Exam  General: Alert and oriented x 3, No apparent distress. obese  HEENT: Head is normocephalic, atraumatic, PERRLA, EOMI, sclera anicteric, oral mucosa pink and moist, dentition intact, ext ear canals clear,  Neck: Supple without JVD or lymphadenopathy  Heart: Reg rate and rhythm. No murmurs rubs or gallops  Chest: sob with gait, sometimes at rest. Phonates well  Abdomen: Soft, non-tender, non-distended, bowel sounds positive.  Extremities: No clubbing, cyanosis, 2+ edema. Pulses are 2+  Skin: Clean and intact without signs of breakdown  Neuro: Pt is cognitively appropriate with normal insight, memory, and awareness. Cranial nerves 2-12 are intact. Sensory exam is near normal in the legs... Reflexes are 2+ in all 4's. Fine motor coordination is intact. No tremors. Motor function is grossly 4 to 5/5 with pain inhbition. Musculoskeletal: low back and left hip less tender. She is slightly elevated in the left pelvis. She  is antalgic with gait on the left. Crepitus in both knees with more pain in left knee. Edema 1++ LLE. . Gait is generally antalgic. Fingers lack flexion especially at DIP's. No catches or nodules palpated along volar aspects of fingers.  Psych: Pt's affect is appropriate. Pt is cooperative   Assessment & Plan:   Assessment:  1. Lumbar spinal stenosis with neurogenic claudication. Associated facet arthropathy  2. Depression.  3. Diabetes mellitus type 2 with polyneuropathy  4. Rheumatoid arthritis and osteoarthritis. Multiple joint involvement including knees.  5. Left greater troch bursitis  6. Interstitial lung disease  7. Multifactorial fatigue  Plan:  1. After informed consent and preparation of the skin with betadine and isopropyl alcohol, I injected 45m (1cc) of celestone and 4cc of 1% lidocaine into the left knee via ant-lat approach. Additionally, aspiration was performed prior to injection. The patient tolerated well, and no complications were encountered. Afterward the area was cleaned and dressed. Post- injection instructions were provided.    2.  . Voltaren gel for fingers/knees.  Suggested furtherollow up with rheum as well regarding her cellcept, RA mgt.  3. MSO4 for breakthrough pain, one q8 prn #75---encouraged her to use if needed.   -tramadol 1080mER bid   4 . Discussed leisure activities, stress relief as well.  5. Continue cymbalta to 6050mid for neuropathic and axial spine pain.    6. Follow up with me in 2 months. 30 minutes of face to face patient care time were spent during this visit. All questions were encouraged and answered.

## 2014-09-20 NOTE — Patient Instructions (Signed)
PLEASE CALL ME WITH ANY PROBLEMS OR QUESTIONS (#[phone]).      

## 2014-09-21 LAB — PMP ALCOHOL METABOLITE (ETG): ETGU: NEGATIVE ng/mL

## 2014-09-24 LAB — OPIATES/OPIOIDS (LC/MS-MS)
CODEINE URINE: NEGATIVE ng/mL (ref <50)
HYDROCODONE: NEGATIVE ng/mL (ref <50)
Hydromorphone: NEGATIVE ng/mL — AB (ref <50)
Morphine Urine: 4320 ng/mL (ref <50)
Norhydrocodone, Ur: NEGATIVE ng/mL (ref <50)
Noroxycodone, Ur: NEGATIVE ng/mL (ref <50)
OXYMORPHONE, URINE: NEGATIVE ng/mL (ref <50)
Oxycodone, ur: NEGATIVE ng/mL (ref <50)

## 2014-09-24 LAB — TRAMADOL, URINE
N-DESMETHYL-CIS-TRAMADOL: 5442 ng/mL (ref <100)
TRAMADOL, URINE: 35951 ng/mL (ref <100)

## 2014-09-26 ENCOUNTER — Ambulatory Visit: Payer: Medicare Other | Admitting: *Deleted

## 2014-09-26 DIAGNOSIS — E1151 Type 2 diabetes mellitus with diabetic peripheral angiopathy without gangrene: Secondary | ICD-10-CM

## 2014-09-26 LAB — PRESCRIPTION MONITORING PROFILE (SOLSTAS)
Amphetamine/Meth: NEGATIVE ng/mL
Barbiturate Screen, Urine: NEGATIVE ng/mL
Benzodiazepine Screen, Urine: NEGATIVE ng/mL
Buprenorphine, Urine: NEGATIVE ng/mL
CANNABINOID SCRN UR: NEGATIVE ng/mL
CARISOPRODOL, URINE: NEGATIVE ng/mL
COCAINE METABOLITES: NEGATIVE ng/mL
CREATININE, URINE: 85.52 mg/dL (ref >20.0)
Fentanyl, Ur: NEGATIVE ng/mL
MDMA URINE: NEGATIVE ng/mL
MEPERIDINE UR: NEGATIVE ng/mL
Methadone Screen, Urine: NEGATIVE ng/mL
Nitrites, Initial: NEGATIVE ug/mL
OXYCODONE SCRN UR: NEGATIVE ng/mL
Propoxyphene: NEGATIVE ng/mL
Tapentadol, urine: NEGATIVE ng/mL
Zolpidem, Urine: NEGATIVE ng/mL
pH, Initial: 5 pH (ref 4.5–8.9)

## 2014-09-26 NOTE — Patient Instructions (Signed)

## 2014-09-27 DIAGNOSIS — Z9981 Dependence on supplemental oxygen: Secondary | ICD-10-CM | POA: Diagnosis not present

## 2014-09-27 DIAGNOSIS — J849 Interstitial pulmonary disease, unspecified: Secondary | ICD-10-CM | POA: Diagnosis not present

## 2014-09-27 DIAGNOSIS — M069 Rheumatoid arthritis, unspecified: Secondary | ICD-10-CM | POA: Diagnosis not present

## 2014-09-28 ENCOUNTER — Other Ambulatory Visit: Payer: Self-pay

## 2014-09-28 ENCOUNTER — Other Ambulatory Visit (HOSPITAL_COMMUNITY): Payer: Self-pay | Admitting: Gastroenterology

## 2014-09-28 DIAGNOSIS — R1314 Dysphagia, pharyngoesophageal phase: Secondary | ICD-10-CM

## 2014-10-04 DIAGNOSIS — J849 Interstitial pulmonary disease, unspecified: Secondary | ICD-10-CM | POA: Diagnosis not present

## 2014-10-04 DIAGNOSIS — M1A00X0 Idiopathic chronic gout, unspecified site, without tophus (tophi): Secondary | ICD-10-CM | POA: Diagnosis not present

## 2014-10-04 DIAGNOSIS — M0579 Rheumatoid arthritis with rheumatoid factor of multiple sites without organ or systems involvement: Secondary | ICD-10-CM | POA: Diagnosis not present

## 2014-10-04 DIAGNOSIS — M170 Bilateral primary osteoarthritis of knee: Secondary | ICD-10-CM | POA: Diagnosis not present

## 2014-10-04 NOTE — Progress Notes (Signed)
Urine drug screen for this encounter is consistent for prescribed medication 

## 2014-10-06 ENCOUNTER — Ambulatory Visit (INDEPENDENT_AMBULATORY_CARE_PROVIDER_SITE_OTHER): Payer: Medicare Other | Admitting: Pulmonary Disease

## 2014-10-06 ENCOUNTER — Encounter: Payer: Self-pay | Admitting: Pulmonary Disease

## 2014-10-06 VITALS — BP 124/82 | HR 93 | Temp 97.4°F | Ht 64.0 in | Wt 289.0 lb

## 2014-10-06 DIAGNOSIS — J849 Interstitial pulmonary disease, unspecified: Secondary | ICD-10-CM | POA: Diagnosis not present

## 2014-10-06 DIAGNOSIS — R0609 Other forms of dyspnea: Secondary | ICD-10-CM

## 2014-10-06 DIAGNOSIS — R0600 Dyspnea, unspecified: Secondary | ICD-10-CM

## 2014-10-06 NOTE — Patient Instructions (Signed)
Take an extra 7m of lasix this afternoon when you get home, then take 468msat am, Sunday am, and Monday am. Please call you cardiologist on Monday and let them know about your increased fluid.  They can give you more direction on your fluid medication.

## 2014-10-06 NOTE — Progress Notes (Signed)
Subjective:    Patient ID: [name], female    DOB: [birth year], 74 y.o.   MRN: [id number]  HPI The patient comes in today for an acute sick visit. She has known interstitial lung disease that is felt related to her rheumatoid arthritis, and is being treated with CellCept and prednisone with Bactrim prophylaxis. She has noticed increasing shortness of breath over the last week or so, and has been having worsening lower extremity edema. He tells me that she is also had increasing abdominal girth, and her weight has been going up. She has a known history of diastolic dysfunction. She denies any chest congestion, cough, or purulent mucus. She has not had any pleuritic chest pain.   Review of Systems  Constitutional: Positive for unexpected weight change. Negative for fever.  HENT: Negative for congestion, dental problem, ear pain, nosebleeds, postnasal drip, rhinorrhea, sinus pressure, sneezing, sore throat and trouble swallowing.   Eyes: Negative for redness and itching.  Respiratory: Positive for cough, chest tightness, shortness of breath and wheezing.   Cardiovascular: Positive for leg swelling. Negative for palpitations.  Gastrointestinal: Positive for nausea. Negative for vomiting.  Genitourinary: Negative for dysuria.  Musculoskeletal: Positive for joint swelling.  Skin: Negative for rash.  Neurological: Positive for headaches.  Hematological: Bruises/bleeds easily.  Psychiatric/Behavioral: Negative for dysphoric mood. The patient is nervous/anxious.        Objective:   Physical Exam Morbidly obese female in no acute distress Nose without purulence or discharge noted Neck without lymphadenopathy or thyromegaly Chest with crackles in the bases bilaterally, no wheezing noted Cardiac exam with distant heart sounds, regular, and a 2/6 systolic murmur Lower extremities with 3+ pitting edema, no cyanosis Alert and oriented, moves all 4 extremities.       Assessment &  Plan:

## 2014-10-06 NOTE — Assessment & Plan Note (Signed)
The patient has worsening dyspnea on exertion over the last few weeks, and tells me that she has had worsening lower extremity edema and also increasing weight.  I suspect that she is taking on fluid related to her diastolic dysfunction, and surprisingly her oxygen saturations are excellent today. I would like for her to increase her Lasix dose through the weekend, and then contact her cardiologist the first of the week for further direction. There is nothing to suggest a pulmonary infection, and acute flare of her lung disease, or a pulmonary embolus.

## 2014-10-09 ENCOUNTER — Telehealth: Payer: Self-pay | Admitting: Cardiovascular Disease

## 2014-10-09 NOTE — Telephone Encounter (Signed)
Patient of Dr. Kennon Holter, seen for pulmonology sick visit on 5/20 for increased dyspnea, abdominal fluid gain. Cleared for acute illness/pulmonary problems, lasix increased. Instructed to f/u w/ [place] for further instructions.   Patient Instructions     Take an extra 70m of lasix this afternoon when you get home, then take 410msat am, Sunday am, and Monday am. Please call you cardiologist on Monday and let them know about your increased fluid. They can give you more direction on your fluid medication.     Called and spoke to patient, she reports compliance w/ increased lasix dose, reports feeling better today. Still some dyspnea, but notes this is improved. Reports she still has swelling in ankles.  She has lost 4 lbs over weekend - weight 285lbs this AM. Advised to continue 4037mf furosemide daily until o/w instructed. Pt may benefit from PA or NP visit, will defer to [facility] for advice.

## 2014-10-09 NOTE — Telephone Encounter (Signed)
Agree with continuing lasix 40 mg daily. Will need follow-up with Dr. Gwenlyn Found.  Dr. Lemmie Evens

## 2014-10-09 NOTE — Telephone Encounter (Signed)
Pt called in stating that her pulmonologist requested that she call and speak with Dr. Gwenlyn Found about the Lasix she has been prescribed because she was retaining fluid in her abdomin. Please call  Thanks

## 2014-10-09 NOTE — Telephone Encounter (Signed)
Instructions given to patient. She verbalized understanding. She has visit w/ Cecilie Kicks in 3 weeks, patient elected to wait on scheduling visit w/ Dr. Gwenlyn Found until seen.

## 2014-10-11 ENCOUNTER — Ambulatory Visit (HOSPITAL_COMMUNITY)
Admission: RE | Admit: 2014-10-11 | Discharge: 2014-10-11 | Disposition: A | Discharge status: Home / Self Care | Payer: Medicare Other | Source: Ambulatory Visit | Attending: Gastroenterology | Admitting: Gastroenterology

## 2014-10-11 DIAGNOSIS — M4812 Ankylosing hyperostosis [Forestier], cervical region: Secondary | ICD-10-CM | POA: Diagnosis not present

## 2014-10-11 DIAGNOSIS — R1310 Dysphagia, unspecified: Secondary | ICD-10-CM | POA: Diagnosis not present

## 2014-10-11 DIAGNOSIS — R1314 Dysphagia, pharyngoesophageal phase: Secondary | ICD-10-CM | POA: Diagnosis not present

## 2014-10-11 DIAGNOSIS — R1313 Dysphagia, pharyngeal phase: Secondary | ICD-10-CM | POA: Insufficient documentation

## 2014-10-11 NOTE — Procedures (Signed)
Objective Swallowing Evaluation: Other (Comment) (MBS)  Patient Details  Name: [name] MRN: [id number] Date of Birth: February 12, 1941  Today's Date: 10/11/2014 Time: SLP Start Time (ACUTE ONLY): 1310-SLP Stop Time (ACUTE ONLY): 1335 SLP Time Calculation (min) (ACUTE ONLY): 25 min  Past Medical History:  Past Medical History  Diagnosis Date  . Diabetes mellitus   . Hypertension   . Thyroid disease   . Arthritis     endstage changes bilateral knees/bilateral ankles.   . High cholesterol   . 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 02/17/2012    Continue diplopia, left eye pain and left headaches.    . Depression, reactive   . RA (rheumatoid arthritis)     has been off methotreaxte since 10/13.  . Contusion of left knee     due to fall 1/14.  [name] URI (upper respiratory infection)   . Clotting disorder     pt denies this  . Asthma   . Carotid artery occlusion   . Interstitial lung disease   . Morbid obesity   . Shortness of breath   . Family history of heart disease   . Chronic kidney disease   . COPD (chronic obstructive pulmonary disease)     pulmonary fibrosis   Past Surgical History:  Past Surgical History  Procedure Laterality Date  . Abdominal hysterectomy    . Ovary surgery    . Shoulder surgery Left   . Brain surgery      Gamma knife 10/13. Needs repeat spring  '14  . Video bronchoscopy Bilateral 05/31/2013    Procedure: VIDEO BRONCHOSCOPY WITHOUT FLUORO;  Surgeon: Brand Males, MD;  Location: [facility];  Service: Cardiopulmonary;  Laterality: Bilateral;  . Video bronscoscopy  2000    lung  . Tonsillectomy  age 86  . Esophagogastroduodenoscopy (egd) with propofol N/A 09/14/2014    Procedure: ESOPHAGOGASTRODUODENOSCOPY (EGD) WITH PROPOFOL;  Surgeon: [name], MD;  Location: [facility];  Service: Endoscopy;  Laterality: N/A;   HPI:  Other Pertinent Information: 74 yo female with h/o ILD, COPD, brain mass s/p gamma knife, DOE,  fatigue, meningioma, decreased renal function, DM2, HTN, RA- recently seen by MD for increased dyspnea x1 week prior to visit.  Pt also reports recent h/o sore throat and voice "coming and going".  She reports having seen ENT and GI - GI MD completed EGD 4/28 with results as follows "irregular GE with several islands of gastric appearing mucosa extending approximately 1 cm proximally = Biposies taken to rule out Barrett's esophagus.  Streaky gastritis in the gastric antrum. otherwise normal."  Pt reports difficulties swallowing and requiring heimlich manuever x2 with pills *approx 4 and 6 years ago.  She reports sensation of foods/pills lodging in throat requiring extra liquids to clear.  Pt also reports being placed on Protonix recently and has not yet noted symptom changes/improvement.    Subjective: pt alert, sitting upright in chair  Assessment / Plan / Recommendation CHL IP CLINICAL IMPRESSIONS 10/11/2014  Therapy Diagnosis Mild pharyngeal phase dysphagia  Clinical Impression  Pt presents with minimal oral and mild pharyngeal dysphagia.  Xerostomia contributes to oral deficits and piecemeal deglutition apparent with solids/pudding.  Second swallow of solid cracker was mildly delayed.  Pharyngeal residuals of solid/pudding noted in pyriform sinus region with pt sensation.  SLP questions if appearance of anterior cervical osteophytes impinge into lower pharynx/proximal esophagus contributing to residuals.  Liquid wash faciliates clearance.  Trace upper laryngeal penetration of thin liquids noted x2  of approximately 5 boluses.    Using live video, provided pt with education to strategies to mitigate her dysphagia symptoms.  Xerostomia compensations also provided.   Recommend regular/thin with precautions.  SLP advised pt to modifications to use if indicated give her ILD.  Thanks for this referral.       CHL IP TREATMENT RECOMMENDATION 10/11/2014  Treatment Recommendations No treatment recommended at  this time     CHL IP DIET RECOMMENDATION 10/11/2014  SLP Diet Recommendations Age appropriate regular solids;Thin  Liquid Administration via Cup, straw  Medication Administration Whole meds with liquid  Compensations Small sips/bites;Slow rate;Follow solids with liquid; intermittent dry swallow  Postural Changes and/or Swallow Maneuvers Xerostomia precautions, start meal with liquids, extra gravies/sauces, etc     CHL IP OTHER RECOMMENDATIONS 10/11/2014  Recommended Consults (None)  Oral Care Recommendations Oral care BID  Other Recommendations (None)         CHL IP REASON FOR REFERRAL 10/11/2014  Reason for Referral Objectively evaluate swallowing function         CHL IP PHARYNGEAL PHASE 10/11/2014  Pharyngeal Phase Impaired  Pharyngeal Comment liquid swallows facilitate clearance of solid/pudding stasis at pyriform sinus      CHL IP CERVICAL ESOPHAGEAL PHASE 10/11/2014  Cervical Esophageal Phase WFL    CHL IP GO 10/11/2014  Functional Assessment Tool Used MBS, clinical judgement  Functional Limitations Swallowing  Swallow Current Status (Q5956) CI  Swallow Goal Status (L8756) CI  Swallow Discharge Status (E3329) [place], [place] [facility] SLP [phone]

## 2014-10-30 ENCOUNTER — Encounter: Payer: Self-pay | Admitting: Cardiology

## 2014-10-30 ENCOUNTER — Inpatient Hospital Stay (HOSPITAL_COMMUNITY)
Admission: AD | Admit: 2014-10-30 | Discharge: 2014-11-02 | DRG: 287 | Disposition: A | Discharge status: Home / Self Care | Payer: Medicare Other | Source: Ambulatory Visit | Attending: Internal Medicine | Admitting: Internal Medicine

## 2014-10-30 ENCOUNTER — Inpatient Hospital Stay (HOSPITAL_COMMUNITY): Payer: Medicare Other

## 2014-10-30 ENCOUNTER — Ambulatory Visit (INDEPENDENT_AMBULATORY_CARE_PROVIDER_SITE_OTHER): Payer: Medicare Other | Admitting: Internal Medicine

## 2014-10-30 VITALS — BP 110/72 | HR 92 | Ht 64.0 in | Wt 285.3 lb

## 2014-10-30 DIAGNOSIS — E079 Disorder of thyroid, unspecified: Secondary | ICD-10-CM

## 2014-10-30 DIAGNOSIS — R0609 Other forms of dyspnea: Secondary | ICD-10-CM | POA: Diagnosis present

## 2014-10-30 DIAGNOSIS — W19XXXA Unspecified fall, initial encounter: Secondary | ICD-10-CM | POA: Diagnosis present

## 2014-10-30 DIAGNOSIS — E1140 Type 2 diabetes mellitus with diabetic neuropathy, unspecified: Secondary | ICD-10-CM | POA: Diagnosis present

## 2014-10-30 DIAGNOSIS — N289 Disorder of kidney and ureter, unspecified: Secondary | ICD-10-CM

## 2014-10-30 DIAGNOSIS — R079 Chest pain, unspecified: Secondary | ICD-10-CM | POA: Diagnosis present

## 2014-10-30 DIAGNOSIS — R0600 Dyspnea, unspecified: Secondary | ICD-10-CM

## 2014-10-30 DIAGNOSIS — J449 Chronic obstructive pulmonary disease, unspecified: Secondary | ICD-10-CM | POA: Diagnosis present

## 2014-10-30 DIAGNOSIS — I5032 Chronic diastolic (congestive) heart failure: Secondary | ICD-10-CM | POA: Diagnosis not present

## 2014-10-30 DIAGNOSIS — R0602 Shortness of breath: Secondary | ICD-10-CM | POA: Diagnosis not present

## 2014-10-30 DIAGNOSIS — IMO0001 Reserved for inherently not codable concepts without codable children: Secondary | ICD-10-CM

## 2014-10-30 DIAGNOSIS — R5382 Chronic fatigue, unspecified: Secondary | ICD-10-CM

## 2014-10-30 DIAGNOSIS — E785 Hyperlipidemia, unspecified: Secondary | ICD-10-CM | POA: Diagnosis present

## 2014-10-30 DIAGNOSIS — I10 Essential (primary) hypertension: Secondary | ICD-10-CM | POA: Diagnosis present

## 2014-10-30 DIAGNOSIS — Y92009 Unspecified place in unspecified non-institutional (private) residence as the place of occurrence of the external cause: Secondary | ICD-10-CM

## 2014-10-30 DIAGNOSIS — J849 Interstitial pulmonary disease, unspecified: Secondary | ICD-10-CM | POA: Diagnosis present

## 2014-10-30 DIAGNOSIS — R262 Difficulty in walking, not elsewhere classified: Secondary | ICD-10-CM | POA: Diagnosis not present

## 2014-10-30 DIAGNOSIS — Z0389 Encounter for observation for other suspected diseases and conditions ruled out: Secondary | ICD-10-CM

## 2014-10-30 DIAGNOSIS — I272 Other secondary pulmonary hypertension: Secondary | ICD-10-CM | POA: Diagnosis present

## 2014-10-30 DIAGNOSIS — I25110 Atherosclerotic heart disease of native coronary artery with unstable angina pectoris: Principal | ICD-10-CM | POA: Diagnosis present

## 2014-10-30 DIAGNOSIS — I200 Unstable angina: Secondary | ICD-10-CM

## 2014-10-30 DIAGNOSIS — N183 Chronic kidney disease, stage 3 (moderate): Secondary | ICD-10-CM | POA: Diagnosis present

## 2014-10-30 DIAGNOSIS — J8410 Pulmonary fibrosis, unspecified: Secondary | ICD-10-CM | POA: Diagnosis present

## 2014-10-30 DIAGNOSIS — E119 Type 2 diabetes mellitus without complications: Secondary | ICD-10-CM | POA: Diagnosis present

## 2014-10-30 DIAGNOSIS — R0789 Other chest pain: Secondary | ICD-10-CM

## 2014-10-30 DIAGNOSIS — Z9981 Dependence on supplemental oxygen: Secondary | ICD-10-CM

## 2014-10-30 DIAGNOSIS — Z6841 Body Mass Index (BMI) 40.0 and over, adult: Secondary | ICD-10-CM | POA: Diagnosis not present

## 2014-10-30 DIAGNOSIS — E7800 Pure hypercholesterolemia, unspecified: Secondary | ICD-10-CM

## 2014-10-30 DIAGNOSIS — I129 Hypertensive chronic kidney disease with stage 1 through stage 4 chronic kidney disease, or unspecified chronic kidney disease: Secondary | ICD-10-CM | POA: Diagnosis present

## 2014-10-30 DIAGNOSIS — M069 Rheumatoid arthritis, unspecified: Secondary | ICD-10-CM | POA: Diagnosis present

## 2014-10-30 LAB — COMPREHENSIVE METABOLIC PANEL
ALBUMIN: 3.2 g/dL — AB (ref 3.5–5.0)
ALK PHOS: 70 U/L (ref 38–126)
ALT: 28 U/L (ref 14–54)
AST: 21 U/L (ref 15–41)
Anion gap: 8 (ref 5–15)
BUN: 31 mg/dL — ABNORMAL HIGH (ref 6–20)
CHLORIDE: 97 mmol/L — AB (ref 101–111)
CO2: 34 mmol/L — ABNORMAL HIGH (ref 22–32)
Calcium: 8.9 mg/dL (ref 8.9–10.3)
Creatinine, Ser: 1.65 mg/dL — ABNORMAL HIGH (ref 0.44–1.00)
GFR calc Af Amer: 35 mL/min — ABNORMAL LOW (ref >60)
GFR calc non Af Amer: 30 mL/min — ABNORMAL LOW (ref >60)
Glucose, Bld: 226 mg/dL — ABNORMAL HIGH (ref 65–99)
Potassium: 3 mmol/L — ABNORMAL LOW (ref 3.5–5.1)
Sodium: 139 mmol/L (ref 135–145)
Total Bilirubin: 0.6 mg/dL (ref 0.3–1.2)
Total Protein: 5.9 g/dL — ABNORMAL LOW (ref 6.5–8.1)

## 2014-10-30 LAB — GLUCOSE, CAPILLARY
GLUCOSE-CAPILLARY: 216 mg/dL — AB (ref 65–99)
Glucose-Capillary: 113 mg/dL — ABNORMAL HIGH (ref 65–99)

## 2014-10-30 LAB — BRAIN NATRIURETIC PEPTIDE: B NATRIURETIC PEPTIDE 5: 4.6 pg/mL (ref 0.0–100.0)

## 2014-10-30 LAB — PROTIME-INR
INR: 1.14 (ref 0.00–1.49)
PROTHROMBIN TIME: 14.8 s (ref 11.6–15.2)

## 2014-10-30 LAB — TROPONIN I
Troponin I: <0.03 ng/mL (ref <0.031)
Troponin I: <0.03 ng/mL (ref <0.031)

## 2014-10-30 LAB — TSH: TSH: 3.783 u[IU]/mL (ref 0.350–4.500)

## 2014-10-30 LAB — APTT: aPTT: 25 seconds (ref 24–37)

## 2014-10-30 LAB — T4, FREE: Free T4: 0.91 ng/dL (ref 0.61–1.12)

## 2014-10-30 LAB — MAGNESIUM: Magnesium: 1.7 mg/dL (ref 1.7–2.4)

## 2014-10-30 MED ORDER — GABAPENTIN 600 MG PO TABS
1200.0000 mg | ORAL_TABLET | Freq: Every day | ORAL | Status: DC
  Administered 2014-10-31 – 2014-11-02 (×2): 1200 mg via ORAL
  Filled 2014-10-30 (×5): qty 2

## 2014-10-30 MED ORDER — ASPIRIN EC 81 MG PO TBEC: 81.0000 mg | DELAYED_RELEASE_TABLET | Freq: Every morning | ORAL | Status: DC

## 2014-10-30 MED ORDER — PANTOPRAZOLE SODIUM 40 MG PO TBEC: 40.0000 mg | DELAYED_RELEASE_TABLET | Freq: Two times a day (BID) | ORAL | Status: DC

## 2014-10-30 MED ORDER — ACETAMINOPHEN 325 MG PO TABS: 650.0000 mg | ORAL_TABLET | ORAL | Status: DC | PRN

## 2014-10-30 MED ORDER — SODIUM CHLORIDE 0.9 % IJ SOLN
3.0000 mL | Freq: Two times a day (BID) | INTRAMUSCULAR | Status: DC
  Administered 2014-10-31: 3 mL via INTRAVENOUS

## 2014-10-30 MED ORDER — POTASSIUM CHLORIDE CRYS ER 20 MEQ PO TBCR
40.0000 meq | EXTENDED_RELEASE_TABLET | Freq: Once | ORAL | Status: AC
  Administered 2014-10-30: 40 meq via ORAL
  Filled 2014-10-30: qty 2

## 2014-10-30 MED ORDER — ALBUTEROL SULFATE (2.5 MG/3ML) 0.083% IN NEBU
3.0000 mL | INHALATION_SOLUTION | Freq: Four times a day (QID) | RESPIRATORY_TRACT | Status: DC | PRN
Start: 2014-10-30 — End: 2014-11-02

## 2014-10-30 MED ORDER — CALCIUM + D3 600-200 MG-UNIT PO TABS
2.0000 | ORAL_TABLET | Freq: Every day | ORAL | Status: DC
  Filled 2014-10-30 (×2): qty 1

## 2014-10-30 MED ORDER — MORPHINE SULFATE 15 MG PO TABS
15.0000 mg | ORAL_TABLET | Freq: Four times a day (QID) | ORAL | Status: DC | PRN
  Administered 2014-11-01 (×2): 15 mg via ORAL
  Filled 2014-10-30 (×2): qty 1

## 2014-10-30 MED ORDER — GABAPENTIN 600 MG PO TABS: 600.0000 mg | ORAL_TABLET | ORAL | Status: DC

## 2014-10-30 MED ORDER — HEPARIN (PORCINE) IN NACL 100-0.45 UNIT/ML-% IJ SOLN
1400.0000 [IU]/h | INTRAMUSCULAR | Status: DC
  Administered 2014-10-30: 1200 [IU]/h via INTRAVENOUS
  Filled 2014-10-30 (×3): qty 250

## 2014-10-30 MED ORDER — PREDNISONE 10 MG PO TABS
10.0000 mg | ORAL_TABLET | Freq: Every day | ORAL | Status: DC
  Administered 2014-10-31 – 2014-11-02 (×3): 10 mg via ORAL
  Filled 2014-10-30 (×4): qty 1

## 2014-10-30 MED ORDER — ATORVASTATIN CALCIUM 10 MG PO TABS
10.0000 mg | ORAL_TABLET | Freq: Every evening | ORAL | Status: DC
  Administered 2014-10-30 – 2014-11-02 (×4): 10 mg via ORAL
  Filled 2014-10-30 (×4): qty 1

## 2014-10-30 MED ORDER — PANTOPRAZOLE SODIUM 40 MG PO TBEC
40.0000 mg | DELAYED_RELEASE_TABLET | Freq: Every day | ORAL | Status: DC
  Administered 2014-10-31 – 2014-11-02 (×3): 40 mg via ORAL
  Filled 2014-10-30 (×3): qty 1

## 2014-10-30 MED ORDER — METOPROLOL SUCCINATE ER 100 MG PO TB24
100.0000 mg | ORAL_TABLET | Freq: Every day | ORAL | Status: DC
  Administered 2014-10-31 – 2014-11-02 (×3): 100 mg via ORAL
  Filled 2014-10-30 (×4): qty 1

## 2014-10-30 MED ORDER — ALLOPURINOL 100 MG PO TABS
200.0000 mg | ORAL_TABLET | Freq: Every day | ORAL | Status: DC
  Administered 2014-10-31 – 2014-11-02 (×3): 200 mg via ORAL
  Filled 2014-10-30 (×3): qty 2

## 2014-10-30 MED ORDER — ADULT MULTIVITAMIN W/MINERALS CH
1.0000 | ORAL_TABLET | Freq: Every day | ORAL | Status: DC
  Administered 2014-10-31 – 2014-11-02 (×3): 1 via ORAL
  Filled 2014-10-30 (×3): qty 1

## 2014-10-30 MED ORDER — GABAPENTIN 600 MG PO TABS
1200.0000 mg | ORAL_TABLET | Freq: Every day | ORAL | Status: DC
  Administered 2014-10-31 – 2014-11-02 (×4): 1200 mg via ORAL
  Filled 2014-10-30 (×3): qty 2

## 2014-10-30 MED ORDER — GABAPENTIN 600 MG PO TABS
1800.0000 mg | ORAL_TABLET | Freq: Every evening | ORAL | Status: DC
  Administered 2014-10-30 – 2014-11-02 (×4): 1800 mg via ORAL
  Filled 2014-10-30 (×4): qty 3

## 2014-10-30 MED ORDER — VITAMIN D3 25 MCG (1000 UNIT) PO TABS
2000.0000 [IU] | ORAL_TABLET | Freq: Every day | ORAL | Status: DC
  Administered 2014-10-31 – 2014-11-02 (×3): 2000 [IU] via ORAL
  Filled 2014-10-30 (×4): qty 2

## 2014-10-30 MED ORDER — IRBESARTAN 300 MG PO TABS: 300.0000 mg | ORAL_TABLET | Freq: Every day | ORAL | Status: DC

## 2014-10-30 MED ORDER — HEPARIN BOLUS VIA INFUSION
4000.0000 [IU] | Freq: Once | INTRAVENOUS | Status: DC
  Filled 2014-10-30: qty 4000

## 2014-10-30 MED ORDER — SODIUM CHLORIDE 0.9 % IV SOLN: 250.0000 mL | INTRAVENOUS | Status: DC | PRN

## 2014-10-30 MED ORDER — ONDANSETRON HCL 4 MG/2ML IJ SOLN: 4.0000 mg | Freq: Four times a day (QID) | INTRAMUSCULAR | Status: DC | PRN

## 2014-10-30 MED ORDER — SODIUM CHLORIDE 0.9 % IV SOLN
INTRAVENOUS | Status: DC
Start: 2014-10-30 — End: 2014-11-02

## 2014-10-30 MED ORDER — NITROGLYCERIN 0.4 MG SL SUBL: 0.4000 mg | SUBLINGUAL_TABLET | SUBLINGUAL | Status: DC | PRN

## 2014-10-30 MED ORDER — LINAGLIPTIN 5 MG PO TABS
5.0000 mg | ORAL_TABLET | Freq: Every morning | ORAL | Status: DC
Start: 2014-10-31 — End: 2014-11-02
  Administered 2014-11-01 – 2014-11-02 (×2): 5 mg via ORAL
  Filled 2014-10-30 (×3): qty 1

## 2014-10-30 MED ORDER — ALPRAZOLAM 0.25 MG PO TABS
0.2500 mg | ORAL_TABLET | Freq: Two times a day (BID) | ORAL | Status: DC | PRN
  Administered 2014-11-01: 0.25 mg via ORAL
  Filled 2014-10-30: qty 1

## 2014-10-30 MED ORDER — HYDROCHLOROTHIAZIDE 25 MG PO TABS: 25.0000 mg | ORAL_TABLET | Freq: Every day | ORAL | Status: DC

## 2014-10-30 MED ORDER — ASPIRIN 81 MG PO CHEW
324.0000 mg | CHEWABLE_TABLET | ORAL | Status: AC
  Administered 2014-10-30: 324 mg via ORAL
  Filled 2014-10-30: qty 4

## 2014-10-30 MED ORDER — ASPIRIN 300 MG RE SUPP
300.0000 mg | RECTAL | Status: AC
  Filled 2014-10-30: qty 1

## 2014-10-30 MED ORDER — DULOXETINE HCL 60 MG PO CPEP
120.0000 mg | ORAL_CAPSULE | Freq: Every evening | ORAL | Status: DC
  Administered 2014-10-30 – 2014-11-02 (×4): 120 mg via ORAL
  Filled 2014-10-30 (×4): qty 2

## 2014-10-30 MED ORDER — ASPIRIN EC 81 MG PO TBEC
81.0000 mg | DELAYED_RELEASE_TABLET | Freq: Every day | ORAL | Status: DC
Start: 2014-10-31 — End: 2014-11-02
  Administered 2014-11-01 – 2014-11-02 (×2): 81 mg via ORAL
  Filled 2014-10-30 (×3): qty 1

## 2014-10-30 MED ORDER — INSULIN ASPART 100 UNIT/ML ~~LOC~~ SOLN
0.0000 [IU] | Freq: Three times a day (TID) | SUBCUTANEOUS | Status: DC
  Administered 2014-10-31 – 2014-11-01 (×2): 2 [IU] via SUBCUTANEOUS
  Administered 2014-11-01: 1 [IU] via SUBCUTANEOUS
  Administered 2014-11-01 – 2014-11-02 (×2): 2 [IU] via SUBCUTANEOUS
  Administered 2014-11-02: 1 [IU] via SUBCUTANEOUS
  Administered 2014-11-02: 2 [IU] via SUBCUTANEOUS

## 2014-10-30 MED ORDER — OLMESARTAN MEDOXOMIL-HCTZ 40-25 MG PO TABS: 1.0000 | ORAL_TABLET | Freq: Every morning | ORAL | Status: DC

## 2014-10-30 MED ORDER — SODIUM CHLORIDE 0.9 % IV SOLN
INTRAVENOUS | Status: DC
  Administered 2014-10-30: 18:00:00 via INTRAVENOUS

## 2014-10-30 MED ORDER — ACETAMINOPHEN 500 MG PO TABS: 1000.0000 mg | ORAL_TABLET | Freq: Two times a day (BID) | ORAL | Status: DC | PRN

## 2014-10-30 MED ORDER — SULFAMETHOXAZOLE-TRIMETHOPRIM 800-160 MG PO TABS
1.0000 | ORAL_TABLET | ORAL | Status: DC
  Administered 2014-10-30 – 2014-11-01 (×2): 1 via ORAL
  Filled 2014-10-30 (×4): qty 1

## 2014-10-30 MED ORDER — MYCOPHENOLATE MOFETIL 250 MG PO CAPS
500.0000 mg | ORAL_CAPSULE | Freq: Two times a day (BID) | ORAL | Status: DC
  Administered 2014-10-30 – 2014-11-02 (×6): 500 mg via ORAL
  Filled 2014-10-30 (×7): qty 2

## 2014-10-30 MED ORDER — TRAMADOL HCL 50 MG PO TABS
100.0000 mg | ORAL_TABLET | Freq: Two times a day (BID) | ORAL | Status: DC
  Administered 2014-10-30 – 2014-11-02 (×7): 100 mg via ORAL
  Filled 2014-10-30 (×7): qty 2

## 2014-10-30 MED ORDER — LEVOTHYROXINE SODIUM 50 MCG PO TABS
50.0000 ug | ORAL_TABLET | Freq: Every day | ORAL | Status: DC
  Administered 2014-10-31 – 2014-11-02 (×3): 50 ug via ORAL
  Filled 2014-10-30 (×4): qty 1

## 2014-10-30 MED ORDER — SODIUM CHLORIDE 0.9 % IJ SOLN: 3.0000 mL | INTRAMUSCULAR | Status: DC | PRN

## 2014-10-30 MED ORDER — DEXTROMETHORPHAN POLISTIREX 30 MG/5ML PO LQCR: 30.0000 mg | Freq: Every day | ORAL | Status: DC | PRN

## 2014-10-30 MED ORDER — ASPIRIN 81 MG PO CHEW
81.0000 mg | CHEWABLE_TABLET | ORAL | Status: AC
  Administered 2014-10-31: 81 mg via ORAL
  Filled 2014-10-30: qty 1

## 2014-10-30 NOTE — Progress Notes (Signed)
ANTICOAGULATION CONSULT NOTE - Initial Consult  Pharmacy Consult for Heparin Indication: chest pain/ACS  Allergies  Allergen Reactions  . Remicade [Infliximab] Anaphylaxis    "sent me into shock"  . Codeine Swelling    Facial swelling  . Zestril [Lisinopril] Swelling    Face and neck swelling    Patient Measurements: Height: _0  (162.6 cm) Weight: 285 lb 6.4 oz (129.457 kg) IBW/kg (Calculated) : 54.7 Heparin Dosing Weight: 86 kg  Vital Signs: Temp: 98.9 F (37.2 C) (06/13 1530) Temp Source: Oral (06/13 1530) BP: 113/82 mmHg (06/13 1530) Pulse Rate: 100 (06/13 1530)  Labs: No results for input(s): HGB, HCT, PLT, APTT, LABPROT, INR, HEPARINUNFRC, CREATININE, CKTOTAL, CKMB, TROPONINI in the last 72 hours.  CrCl cannot be calculated (Patient has no serum creatinine result on file.).   Medical History: Past Medical History  Diagnosis Date  . Diabetes mellitus   . Hypertension   . Thyroid disease   . Arthritis     endstage changes bilateral knees/bilateral ankles.   . High cholesterol   . 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 02/17/2012    Continue diplopia, left eye pain and left headaches.    . Depression, reactive   . RA (rheumatoid arthritis)     has been off methotreaxte since 10/13.  . Contusion of left knee     due to fall 1/14.  [name] URI (upper respiratory infection)   . Clotting disorder     pt denies this  . Asthma   . Carotid artery occlusion   . Interstitial lung disease   . Morbid obesity   . Shortness of breath   . Family history of heart disease   . Chronic kidney disease   . COPD (chronic obstructive pulmonary disease)     pulmonary fibrosis    Medications:  Prescriptions prior to admission  Medication Sig Dispense Refill Last Dose  . ACCU-CHEK SMARTVIEW test strip 1 each by Other route every morning.   1 Taking  . acetaminophen (TYLENOL) 500 MG tablet Take 1,000 mg by mouth 2 (two) times daily as needed for pain. Takes  with tramadol   Taking  . albuterol (PROVENTIL HFA;VENTOLIN HFA) 108 (90 BASE) MCG/ACT inhaler Inhale 2 puffs into the lungs every 6 (six) hours as needed for wheezing or shortness of breath.   Taking  . allopurinol (ZYLOPRIM) 100 MG tablet Take 2 tablets (200 mg total) by mouth daily. 60 tablet 4 Taking  . aspirin EC 81 MG tablet Take 81 mg by mouth every morning.    Taking  . atorvastatin (LIPITOR) 20 MG tablet Take 10 mg by mouth every evening.    Taking  . BENICAR HCT 40-25 MG per tablet Take 1 tablet by mouth every morning.   0 Taking  . Calcium Carb-Cholecalciferol (CALCIUM + D3 PO) Take 2 tablets by mouth daily with lunch.    Taking  . cholecalciferol (VITAMIN D) 1000 UNITS tablet Take 2,000 Units by mouth daily with lunch.    Taking  . colchicine 0.6 MG tablet Take 0.6 mg by mouth daily as needed (GOUT).   Taking  . dextromethorphan (DELSYM) 30 MG/5ML liquid Take 30 mg by mouth daily as needed for cough.    Taking  . diclofenac sodium (VOLTAREN) 1 % GEL Apply 1 application topically 3 (three) times daily. To right knee 3 Tube 4 Taking  . DULoxetine (CYMBALTA) 60 MG capsule Take 2 capsules (120 mg total) by mouth daily. (Patient taking differently: Take 120  mg by mouth every evening. ) 60 capsule 4 Taking  . furosemide (LASIX) 20 MG tablet Take 20 mg by mouth every morning.    Taking  . gabapentin (NEURONTIN) 600 MG tablet Take 1 tablet (600 mg total) by mouth as directed. Take 2 tablet in the morning, 2 tablet in the middle of the day, and 3 tablets at night 210 tablet 1 Taking  . levothyroxine (SYNTHROID, LEVOTHROID) 50 MCG tablet Take 1 tablet (50 mcg total) by mouth daily. 30 tablet 1 Taking  . linagliptin (TRADJENTA) 5 MG TABS tablet Take 5 mg by mouth every morning.    Taking  . metoprolol succinate (TOPROL-XL) 100 MG 24 hr tablet Take 100 mg by mouth every morning. Take with or immediately following a meal.   Taking  . morphine (MSIR) 15 MG tablet Take 1 tablet (15 mg total) by mouth  every 6 (six) hours as needed for moderate pain. 75 tablet 0 Taking  . Multiple Vitamin (MULTIVITAMIN WITH MINERALS) TABS Take 1 tablet by mouth daily with lunch.    Taking  . mycophenolate (CELLCEPT) 500 MG tablet Take 500 mg by mouth 2 (two) times daily.    Taking  . OXYGEN-HELIUM IN Inhale 2 L into the lungs at bedtime. And on exertion   Taking  . pantoprazole (PROTONIX) 40 MG tablet Take 40 mg by mouth 2 (two) times daily before a meal.   Taking  . predniSONE (DELTASONE) 10 MG tablet Take 10 mg by mouth daily with breakfast.    Taking  . Spacer/Aero-Holding Chambers (AEROCHAMBER MV) inhaler Use as instructed 1 each 0 Taking  . sulfamethoxazole-trimethoprim (BACTRIM DS,SEPTRA DS) 800-160 MG per tablet Take 1 tablet by mouth 3 (three) times a week. (Patient taking differently: Take 1 tablet by mouth 3 (three) times a week. Mon, Wed, Fri) 15 tablet 0 Taking  . traMADol (ULTRAM-ER) 100 MG 24 hr tablet Take 1 tablet (100 mg total) by mouth 2 times daily at 12 noon and 4 pm. 60 tablet 1 Taking   Scheduled:  . allopurinol  200 mg Oral Daily  . aspirin  324 mg Oral NOW   Or  . aspirin  300 mg Rectal NOW  . [START ON 10/31/2014] aspirin  81 mg Oral Pre-Cath  . [START ON 10/31/2014] aspirin EC  81 mg Oral q morning - 10a  . [START ON 10/31/2014] aspirin EC  81 mg Oral Daily  . atorvastatin  10 mg Oral QPM  . [START ON 10/31/2014] Calcium + D3  2 tablet Oral Q lunch  . [START ON 10/31/2014] cholecalciferol  2,000 Units Oral Q lunch  . DULoxetine  120 mg Oral QPM  . gabapentin  600 mg Oral UD  . [START ON 10/31/2014] insulin aspart  0-9 Units Subcutaneous TID WC  . levothyroxine  50 mcg Oral Daily  . [START ON 10/31/2014] linagliptin  5 mg Oral q morning - 10a  . [START ON 10/31/2014] metoprolol succinate  100 mg Oral q morning - 10a  . [START ON 10/31/2014] multivitamin with minerals  1 tablet Oral Q lunch  . mycophenolate  500 mg Oral BID  . [START ON 10/31/2014] olmesartan-hydrochlorothiazide  1 tablet  Oral q morning - 10a  . pantoprazole  40 mg Oral BID AC  . [START ON 10/31/2014] predniSONE  10 mg Oral Q breakfast  . sodium chloride  3 mL Intravenous Q12H  . sulfamethoxazole-trimethoprim  1 tablet Oral Once per day on Mon Wed Fri  . traMADol  100 mg Oral q12n4p   Infusions:  . sodium chloride    . sodium chloride      Assessment: 74yo female with history of HTN, DM2, CKD, COPD and morbid obesity presents with edema, progressive SOB, and chest pressure. Pharmacy is consulted to dose heparin for ACS/chest pain. CBC is wnl, sCr 1.22, Trop neg x1.  Goal of Therapy:  Heparin level 0.3-0.7 units/ml Monitor platelets by anticoagulation protocol: Yes   Plan:  Give 4000 units bolus x 1 Start heparin infusion at 1200 units/hr Check anti-Xa level in 8 hours and daily while on heparin Continue to monitor H&H and platelets  Andrey Cota. Diona Foley, PharmD Clinical Pharmacist Pager [phone] 10/30/2014,4:35 PM

## 2014-10-30 NOTE — H&P (Signed)
Admission History and Physical.   Date:  10/30/2014   ID:  [name], DOB 12-07-1940, MRN [id number]  PCP:  Maximino Greenland, MD  Cardiologist:  Dr. Gwenlyn Found    CC: Progressive shortness of breath and chest pressure, unable to speak in full sentences   History of Present Illness: [name] is a 74 y.o. female who presents for edema. She was seen by her pulmonologist and found to have volume overload,  Our ofice was notified and she too extra lasix for 3 days.  Then was increased to 40 mg daily.  She has chest pressure that comes and goes.  Very fatigued, DOE. She had to stay in bed 3 days to make it to this appt.  Her lower ext edema is improved with higher dose of lasix.  She has CKD stage 3.  She is tearful today because of her symptoms. She understands she has interstital lung disease and is on steroids.    She does have a strong family history of heart disease with her mother and 46 brothers old with an ischemic heart disease. She has never had a heart attack or stroke. She does have interstitial lung disease as well as rheumatoid arthritis. She is on home oxygen.  She also has a brain tumor/meningioma 2 with gamma knife therapy.   Previously she was evaluated by a cardiologist  with a limited 2-D echocardiogram and astress test and was told that these were normal. She's had progressive disabling dyspnea as well as daily chest pain. She was evaluated by Dr. Adora Fridge in June of this year and had normal Echo except for grade 1 diastolic dysfunction and Normal stress test.     Her symptoms have progressed. Her CT angio of the chest does reveal LAD disease.    With on going and worsening symptoms and calcification of LAD and strong family hx. We will proceed with lt and rt heart cath.  Dr. Debara Pickett has seen and discussed with the pt.  EKG without acute changes.  Will admit today and give gentle hydation.  This will give more info to better manage her symptoms.    Past Medical  History  Diagnosis Date  . Diabetes mellitus   . Hypertension   . Thyroid disease   . Arthritis     endstage changes bilateral knees/bilateral ankles.   . High cholesterol   . 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 02/17/2012    Continue diplopia, left eye pain and left headaches.    . Depression, reactive   . RA (rheumatoid arthritis)     has been off methotreaxte since 10/13.  . Contusion of left knee     due to fall 1/14.  [name] URI (upper respiratory infection)   . Clotting disorder     pt denies this  . Asthma   . Carotid artery occlusion   . Interstitial lung disease   . Morbid obesity   . Shortness of breath   . Family history of heart disease   . Chronic kidney disease   . COPD (chronic obstructive pulmonary disease)     pulmonary fibrosis    Past Surgical History  Procedure Laterality Date  . Abdominal hysterectomy    . Ovary surgery    . Shoulder surgery Left   . Brain surgery      Gamma knife 10/13. Needs repeat spring  '14  . Video bronchoscopy Bilateral 05/31/2013    Procedure: VIDEO BRONCHOSCOPY WITHOUT FLUORO;  Surgeon:  Brand Males, MD;  Location: [facility];  Service: Cardiopulmonary;  Laterality: Bilateral;  . Video bronscoscopy  2000    lung  . Tonsillectomy  age 57  . Esophagogastroduodenoscopy (egd) with propofol N/A 09/14/2014    Procedure: ESOPHAGOGASTRODUODENOSCOPY (EGD) WITH PROPOFOL;  Surgeon: [name], MD;  Location: [facility];  Service: Endoscopy;  Laterality: N/A;     No current facility-administered medications for this encounter.    Allergies:   Remicade; Codeine; and Zestril    Social History:  The patient  reports that she has never smoked. She has never used smokeless tobacco. She reports that she does not drink alcohol or use illicit drugs.   Family History:  The patient's family history includes Breast cancer in her sister; Diabetes in her brother, mother, and sister; Gout in her brother; Heart  attack in her brother and mother; Heart disease in her brother; Hypertension in her brother and father; Kidney cancer in her brother; Kidney failure in her brother; Lung cancer in her father; Rheum arthritis in her maternal uncle; Uterine cancer in her daughter.    ROS:  General:no colds or fevers, weight decreasing with increased dose of lasix Skin:no rashes or ulcers HEENT:no blurred vision, no congestion CV:see HPI PUL:see HPI GI:no diarrhea constipation or melena, no indigestion GU:no hematuria, no dysuria MS:no joint pain, + claudication vs. Just neuropathy, will check lower ext dopplers arterial.  Neuro:no syncope, no lightheadedness Endo:+ diabetes controlled, + thyroid disease  Wt Readings from Last 3 Encounters:  10/30/14 285 lb 6.4 oz (129.457 kg)  10/30/14 285 lb 4.8 oz (129.411 kg)  10/06/14 289 lb (131.09 kg)     PHYSICAL EXAM: VS:  BP 113/82 mmHg  Pulse 100  Temp(Src) 98.9 F (37.2 C) (Oral)  Resp 18  Ht _0  (1.626 m)  Wt 285 lb 6.4 oz (129.457 kg)  BMI 48.96 kg/m2  SpO2 97% , BMI Body mass index is 48.96 kg/(m^2). General:Pleasant affect, though tearful,  NAD Skin:Warm and dry, brisk capillary refill HEENT:normocephalic, sclera clear, mucus membranes moist Neck:supple, no JVD, no bruits  Heart:S1S2 RRR without murmur, gallup, rub or click Lungs:clear without rales, rhonchi, or wheezes ZOX:WRUE, non tender, + BS, do not palpate liver spleen or masses Ext:+1 lower ext edema,  2+ radial pulses Neuro:alert and oriented, MAE, follows commands, + facial symmetry    EKG:  EKG is ordered today. The ekg ordered today demonstrates SR no acute changes.   Recent Labs: 06/06/2014: Pro B Natriuretic peptide (BNP) 18.0 06/21/2014: BUN 23; Creat 1.22*; Hemoglobin 12.7; Platelets 183; Potassium 3.9; Sodium 143    Lipid Panel No results found for: CHOL, TRIG, HDL, CHOLHDL, VLDL, LDLCALC, LDLDIRECT     Other studies Reviewed: Additional studies/ records that were  reviewed today include: pulmonary notes labs, tests.   ASSESSMENT AND PLAN:  1.  [place] with chest pressure and DOE.  Increasing edema and fatigue with strong family hx CAD -Dr. Debara Pickett saw the pt and discussed cardiac cath both Rt and Lt.  She is agreeable and would like to have done ASAP.  We will admit and gently hydrate with her hx of CKD.  Plan for cath in AM. (previous neg. Nuc, Echo with diastolic HF.)  + LAD calcification on CT angio.  2. Diastolic HF, with + edema and DOE.  Will hold lasix until after cath.  3. DM on medications   4.HTN controlled today  5. Hyperlipidemia on lipitor 20  6.RA on cell cept and steroids.   7.  Obesity   Current medicines are reviewed with the patient today.  The patient Has no concerns regarding medicines.  The following changes have been made:  See above Labs/ tests ordered today include:see above  Disposition:   FU:  see above  Pt. Seen and examined. Agree with the NP/PA-C note as written. [name] presented in the office today for follow-up. Although she had a recent negative nuclear stress test, she's had significant progression of her shortness of breath. She is known to have interstitial lung disease and probably has some pulmonary hypertension. She has numerous cardiac risk factors including rheumatoid arthritis and diabetes. Her CT scan of the chest did demonstrate LAD calcium and other sites of coronary artery disease. I'm concerned about possible underlying coronary disease that is progressed to cause some acute worsening of her shortness of breath. High-grade LAD or multivessel coronary disease can be potentially missed on a stress test due to balanced ischemia. I think it would be important to exclude significant obstructive coronary disease as a possible cause of her worsening shortness of breath and chest pressure. I agree with recommendations for left and right heart catheterization. In addition will get some information regarding her  pulmonary pressures, although likely she has some secondary pulmonary hypertension. Her echo showed no tricuspid regurgitation therefore her pulmonary pressures could not be estimated. I discussed the risks and benefits of cardiac catheterization at length in the office today with her, including her increased risk of nephrotoxicity due to some mild renal insufficiency. Also the increased risk of pulmonary complications due to her pulmonary fibrosis, and a higher bleeding risk given her morbid obesity. She understands these risks and is willing to proceed with catheterization. We'll try to arrange that shortly. She will need to be admitted the night before for gentle hydration.  Pixie Casino, MD, [facility] Attending Cardiologist [place], MD  10/30/2014 3:54 PM

## 2014-10-30 NOTE — Progress Notes (Signed)
Cardiology Office Note   Date:  10/30/2014   ID:  [name], [name] 02/16/1941, MRN [id number]  PCP:  Maximino Greenland, MD  Cardiologist:  Dr. Gwenlyn Found    Chief Complaint  Patient presents with  . Follow-up    followup-not doing well and feeling bad-feeling tired last few days, fell this morning; had a little chest pain, has a lot of shortness of breath, has severe edema, has pain in legs, has cramping in legs, has lightheadedness, has dizziness, and is starting to get headaches       History of Present Illness: [name] is a 74 y.o. female who presents for edema. She was seen by her pulmonologist and found to have volume overload,  Our ofice was notified and she too extra lasix for 3 days.  Then was increased to 40 mg daily.  She has chest pressure that comes and goes.  Very fatigued, DOE. She had to stay in bed 3 days to make it to this appt.  Her lower ext edema is improved with higher dose of lasix.  She has CKD stage 3.  She is tearful today because of her symptoms. She understands she has interstital lung disease and is on steroids.    She does have a strong family history of heart disease with her mother and 79 brothers old with an ischemic heart disease. She has never had a heart attack or stroke. She does have interstitial lung disease as well as rheumatoid arthritis. She is on home oxygen.  She also has a brain tumor/meningioma 2 with gamma knife therapy.   Previously she was evaluated by a cardiologist  with a limited 2-D echocardiogram and astress test and was told that these were normal. She's had progressive disabling dyspnea as well as daily chest pain. She was evaluated by Dr. Adora Fridge in June of this year and had normal Echo except for grade 1 diastolic dysfunction and Normal stress test.     Her symptoms have progressed. Her CT angio of the chest does reveal LAD disease.    With on going and worsening symptoms and calcification of LAD and strong family hx. We  will proceed with lt and rt heart cath.  Dr. Debara Pickett has seen and discussed with the pt.  EKG without acute changes.  Will admit today and give gentle hydation.  This will give more info to better manage her symptoms.       Past Medical History  Diagnosis Date  . Diabetes mellitus   . Hypertension   . Thyroid disease   . Arthritis     endstage changes bilateral knees/bilateral ankles.   . High cholesterol   . 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 02/17/2012    Continue diplopia, left eye pain and left headaches.    . Depression, reactive   . RA (rheumatoid arthritis)     has been off methotreaxte since 10/13.  . Contusion of left knee     due to fall 1/14.  [name] URI (upper respiratory infection)   . Clotting disorder     pt denies this  . Asthma   . Carotid artery occlusion   . Interstitial lung disease   . Morbid obesity   . Shortness of breath   . Family history of heart disease   . Chronic kidney disease   . COPD (chronic obstructive pulmonary disease)     pulmonary fibrosis    Past Surgical History  Procedure Laterality Date  .  Abdominal hysterectomy    . Ovary surgery    . Shoulder surgery Left   . Brain surgery      Gamma knife 10/13. Needs repeat spring  '14  . Video bronchoscopy Bilateral 05/31/2013    Procedure: VIDEO BRONCHOSCOPY WITHOUT FLUORO;  Surgeon: Brand Males, MD;  Location: [facility];  Service: Cardiopulmonary;  Laterality: Bilateral;  . Video bronscoscopy  2000    lung  . Tonsillectomy  age 4  . Esophagogastroduodenoscopy (egd) with propofol N/A 09/14/2014    Procedure: ESOPHAGOGASTRODUODENOSCOPY (EGD) WITH PROPOFOL;  Surgeon: [name], MD;  Location: [facility];  Service: Endoscopy;  Laterality: N/A;     Current Outpatient Prescriptions  Medication Sig Dispense Refill  . ACCU-CHEK SMARTVIEW test strip 1 each by Other route every morning.   1  . acetaminophen (TYLENOL) 500 MG tablet Take 1,000 mg by mouth 2  (two) times daily as needed for pain. Takes with tramadol    . albuterol (PROVENTIL HFA;VENTOLIN HFA) 108 (90 BASE) MCG/ACT inhaler Inhale 2 puffs into the lungs every 6 (six) hours as needed for wheezing or shortness of breath.    . allopurinol (ZYLOPRIM) 100 MG tablet Take 2 tablets (200 mg total) by mouth daily. 60 tablet 4  . aspirin EC 81 MG tablet Take 81 mg by mouth every morning.     [name] atorvastatin (LIPITOR) 20 MG tablet Take 10 mg by mouth every evening.     [name] BENICAR HCT 40-25 MG per tablet Take 1 tablet by mouth every morning.   0  . Calcium Carb-Cholecalciferol (CALCIUM + D3 PO) Take 2 tablets by mouth daily with lunch.     . cholecalciferol (VITAMIN D) 1000 UNITS tablet Take 2,000 Units by mouth daily with lunch.     . colchicine 0.6 MG tablet Take 0.6 mg by mouth daily as needed (GOUT).    [name] dextromethorphan (DELSYM) 30 MG/5ML liquid Take 30 mg by mouth daily as needed for cough.     . diclofenac sodium (VOLTAREN) 1 % GEL Apply 1 application topically 3 (three) times daily. To right knee 3 Tube 4  . DULoxetine (CYMBALTA) 60 MG capsule Take 2 capsules (120 mg total) by mouth daily. (Patient taking differently: Take 120 mg by mouth every evening. ) 60 capsule 4  . furosemide (LASIX) 20 MG tablet Take 20 mg by mouth every morning.     . gabapentin (NEURONTIN) 600 MG tablet Take 1 tablet (600 mg total) by mouth as directed. Take 2 tablet in the morning, 2 tablet in the middle of the day, and 3 tablets at night 210 tablet 1  . levothyroxine (SYNTHROID, LEVOTHROID) 50 MCG tablet Take 1 tablet (50 mcg total) by mouth daily. 30 tablet 1  . linagliptin (TRADJENTA) 5 MG TABS tablet Take 5 mg by mouth every morning.     . metoprolol succinate (TOPROL-XL) 100 MG 24 hr tablet Take 100 mg by mouth every morning. Take with or immediately following a meal.    . morphine (MSIR) 15 MG tablet Take 1 tablet (15 mg total) by mouth every 6 (six) hours as needed for moderate pain. 75 tablet 0  . Multiple  Vitamin (MULTIVITAMIN WITH MINERALS) TABS Take 1 tablet by mouth daily with lunch.     . mycophenolate (CELLCEPT) 500 MG tablet Take 500 mg by mouth 2 (two) times daily.     . OXYGEN-HELIUM IN Inhale 2 L into the lungs at bedtime. And on exertion    . pantoprazole (PROTONIX)  40 MG tablet Take 40 mg by mouth 2 (two) times daily before a meal.    . predniSONE (DELTASONE) 10 MG tablet Take 10 mg by mouth daily with breakfast.     . Spacer/Aero-Holding Chambers (AEROCHAMBER MV) inhaler Use as instructed 1 each 0  . sulfamethoxazole-trimethoprim (BACTRIM DS,SEPTRA DS) 800-160 MG per tablet Take 1 tablet by mouth 3 (three) times a week. (Patient taking differently: Take 1 tablet by mouth 3 (three) times a week. Mon, Wed, Fri) 15 tablet 0  . traMADol (ULTRAM-ER) 100 MG 24 hr tablet Take 1 tablet (100 mg total) by mouth 2 times daily at 12 noon and 4 pm. 60 tablet 1   No current facility-administered medications for this visit.    Allergies:   Remicade; Codeine; and Zestril    Social History:  The patient  reports that she has never smoked. She has never used smokeless tobacco. She reports that she does not drink alcohol or use illicit drugs.   Family History:  The patient's family history includes Breast cancer in her sister; Diabetes in her brother, mother, and sister; Gout in her brother; Heart attack in her mother; Hypertension in her brother and father; Kidney cancer in her brother; Kidney failure in her brother; Lung cancer in her father; Rheum arthritis in her maternal uncle; Uterine cancer in her daughter.    ROS:  General:no colds or fevers, weight decreasing with increased dose of lasix Skin:no rashes or ulcers HEENT:no blurred vision, no congestion CV:see HPI PUL:see HPI GI:no diarrhea constipation or melena, no indigestion GU:no hematuria, no dysuria MS:no joint pain, + claudication vs. Just neuropathy, will check lower ext dopplers arterial.  Neuro:no syncope, no  lightheadedness Endo:+ diabetes controlled, + thyroid disease  Wt Readings from Last 3 Encounters:  10/30/14 285 lb 4.8 oz (129.411 kg)  10/06/14 289 lb (131.09 kg)  09/14/14 293 lb (132.904 kg)     PHYSICAL EXAM: VS:  BP 110/72 mmHg  Pulse 92  Ht _0  (1.626 m)  Wt 285 lb 4.8 oz (129.411 kg)  BMI 48.95 kg/m2 , BMI Body mass index is 48.95 kg/(m^2). General:Pleasant affect, though tearful,  NAD Skin:Warm and dry, brisk capillary refill HEENT:normocephalic, sclera clear, mucus membranes moist Neck:supple, no JVD, no bruits  Heart:S1S2 RRR without murmur, gallup, rub or click Lungs:clear without rales, rhonchi, or wheezes ZHY:QMVH, non tender, + BS, do not palpate liver spleen or masses Ext:+1 lower ext edema,  2+ radial pulses Neuro:alert and oriented, MAE, follows commands, + facial symmetry    EKG:  EKG is ordered today. The ekg ordered today demonstrates SR no acute changes.   Recent Labs: 06/06/2014: Pro B Natriuretic peptide (BNP) 18.0 06/21/2014: BUN 23; Creat 1.22*; Hemoglobin 12.7; Platelets 183; Potassium 3.9; Sodium 143    Lipid Panel No results found for: CHOL, TRIG, HDL, CHOLHDL, VLDL, LDLCALC, LDLDIRECT     Other studies Reviewed: Additional studies/ records that were reviewed today include: pulmonary notes labs, tests.   ASSESSMENT AND PLAN:  1.  [place] with chest pressure and DOE.  Increasing edema and fatigue with strong family hx CAD -Dr. Debara Pickett saw the pt and discussed cardiac cath both Rt and Lt.  She is agreeable and would like to have done ASAP.  We will admit and gently hydrate with her hx of CKD.  Plan for cath in AM. (previous neg. Nuc, Echo with diastolic HF.)  + LAD calcification on CT angio.  2. Diastolic HF, with + edema and DOE.  Will hold  lasix until after cath.  3. DM on medications   4.HTN controlled today  5. Hyperlipidemia on lipitor 20  6.RA on cell cept and steroids.   7. Obesity      Current medicines are reviewed with the  patient today.  The patient Has no concerns regarding medicines.  The following changes have been made:  See above Labs/ tests ordered today include:see above  Disposition:   FU:  see above  Lennie Muckle, NP  10/30/2014 12:10 PM    [facility] [place], [place], [place] [place], [place] Phone: [phone]; Fax: [phone]  Pt. Seen and examined. Agree with the NP/PA-C note as written. Mrs. Nissan presented in the office today for follow-up. Although she had a recent negative nuclear stress test, she's had significant progression of her shortness of breath. She is known to have interstitial lung disease and probably has some pulmonary hypertension. She has numerous cardiac risk factors including rheumatoid arthritis and diabetes. Her CT scan of the chest did demonstrate LAD calcium and other sites of coronary artery disease. I'm concerned about possible underlying coronary disease that is progressed to cause some acute worsening of her shortness of breath. High-grade LAD or multivessel coronary disease can be potentially missed on a stress test due to balanced ischemia. I think it would be important to exclude significant obstructive coronary disease as a possible cause of her worsening shortness of breath and chest pressure. I agree with recommendations for left and right heart catheterization. In addition will get some information regarding her pulmonary pressures, although likely she has some secondary pulmonary hypertension. Her echo showed no tricuspid regurgitation therefore her pulmonary pressures could not be estimated. I discussed the risks and benefits of cardiac catheterization at length in the office today with her, including her increased risk of nephrotoxicity due to some mild renal insufficiency. Also the increased risk of pulmonary complications due to her pulmonary fibrosis, and a higher  bleeding risk given her morbid obesity. She understands these risks and is willing to proceed with catheterization. We'll try to arrange that shortly. She will need to be admitted the night before for gentle hydration.  Pixie Casino, MD, [facility] Attending Cardiologist [place]

## 2014-10-30 NOTE — Patient Instructions (Signed)
Go to hospital.

## 2014-10-31 ENCOUNTER — Ambulatory Visit (HOSPITAL_COMMUNITY)
Admission: RE | Admit: 2014-10-31 | Payer: Medicare Other | Source: Ambulatory Visit | Admitting: Interventional Cardiology

## 2014-10-31 ENCOUNTER — Encounter (HOSPITAL_COMMUNITY)
Admission: AD | Disposition: A | Discharge status: Home / Self Care | Payer: Medicare Other | Source: Ambulatory Visit | Attending: Internal Medicine

## 2014-10-31 ENCOUNTER — Other Ambulatory Visit: Payer: Self-pay

## 2014-10-31 ENCOUNTER — Encounter (HOSPITAL_COMMUNITY): Payer: Self-pay | Admitting: Interventional Cardiology

## 2014-10-31 DIAGNOSIS — R072 Precordial pain: Secondary | ICD-10-CM

## 2014-10-31 DIAGNOSIS — R0602 Shortness of breath: Secondary | ICD-10-CM

## 2014-10-31 HISTORY — PX: CARDIAC CATHETERIZATION: SHX172

## 2014-10-31 LAB — POCT I-STAT 3, VENOUS BLOOD GAS (G3P V)
Acid-Base Excess: 5 mmol/L — ABNORMAL HIGH (ref 0.0–2.0)
BICARBONATE: 30.9 meq/L — AB (ref 20.0–24.0)
O2 SAT: 63 %
PCO2 VEN: 50.1 mmHg — AB (ref 45.0–50.0)
PH VEN: 7.398 — AB (ref 7.250–7.300)
PO2 VEN: 34 mmHg (ref 30.0–45.0)
TCO2: 32 mmol/L (ref 0–100)

## 2014-10-31 LAB — CBC
HCT: 38.7 % (ref 36.0–46.0)
Hemoglobin: 12.1 g/dL (ref 12.0–15.0)
MCH: 29.7 pg (ref 26.0–34.0)
MCHC: 31.3 g/dL (ref 30.0–36.0)
MCV: 94.9 fL (ref 78.0–100.0)
PLATELETS: 176 10*3/uL (ref 150–400)
RBC: 4.08 MIL/uL (ref 3.87–5.11)
RDW: 14.7 % (ref 11.5–15.5)
WBC: 7.5 10*3/uL (ref 4.0–10.5)

## 2014-10-31 LAB — POCT I-STAT 3, ART BLOOD GAS (G3+)
ACID-BASE EXCESS: 4 mmol/L — AB (ref 0.0–2.0)
Acid-Base Excess: 4 mmol/L — ABNORMAL HIGH (ref 0.0–2.0)
BICARBONATE: 29.5 meq/L — AB (ref 20.0–24.0)
Bicarbonate: 29.1 mEq/L — ABNORMAL HIGH (ref 20.0–24.0)
O2 SAT: 87 %
O2 Saturation: 86 %
PCO2 ART: 45.4 mmHg — AB (ref 35.0–45.0)
PH ART: 7.405 (ref 7.350–7.450)
TCO2: 30 mmol/L (ref 0–100)
TCO2: 31 mmol/L (ref 0–100)
pCO2 arterial: 47.1 mmHg — ABNORMAL HIGH (ref 35.0–45.0)
pH, Arterial: 7.416 (ref 7.350–7.450)
pO2, Arterial: 53 mmHg — ABNORMAL LOW (ref 80.0–100.0)
pO2, Arterial: 52 mmHg — ABNORMAL LOW (ref 80.0–100.0)

## 2014-10-31 LAB — HEMOGLOBIN A1C
HEMOGLOBIN A1C: 7 % — AB (ref 4.8–5.6)
MEAN PLASMA GLUCOSE: 154 mg/dL

## 2014-10-31 LAB — GLUCOSE, CAPILLARY
GLUCOSE-CAPILLARY: 168 mg/dL — AB (ref 65–99)
GLUCOSE-CAPILLARY: 188 mg/dL — AB (ref 65–99)
Glucose-Capillary: 146 mg/dL — ABNORMAL HIGH (ref 65–99)
Glucose-Capillary: 149 mg/dL — ABNORMAL HIGH (ref 65–99)

## 2014-10-31 LAB — BASIC METABOLIC PANEL
Anion gap: 9 (ref 5–15)
BUN: 31 mg/dL — ABNORMAL HIGH (ref 6–20)
CO2: 34 mmol/L — ABNORMAL HIGH (ref 22–32)
Calcium: 8.8 mg/dL — ABNORMAL LOW (ref 8.9–10.3)
Chloride: 99 mmol/L — ABNORMAL LOW (ref 101–111)
Creatinine, Ser: 1.36 mg/dL — ABNORMAL HIGH (ref 0.44–1.00)
GFR calc non Af Amer: 38 mL/min — ABNORMAL LOW (ref >60)
GFR, EST AFRICAN AMERICAN: 44 mL/min — AB (ref >60)
GLUCOSE: 149 mg/dL — AB (ref 65–99)
POTASSIUM: 3.7 mmol/L (ref 3.5–5.1)
SODIUM: 142 mmol/L (ref 135–145)

## 2014-10-31 LAB — LIPID PANEL
Cholesterol: 168 mg/dL (ref 0–200)
HDL: 49 mg/dL (ref >40)
LDL CALC: 102 mg/dL — AB (ref 0–99)
Total CHOL/HDL Ratio: 3.4 RATIO
Triglycerides: 87 mg/dL (ref <150)
VLDL: 17 mg/dL (ref 0–40)

## 2014-10-31 LAB — HEPARIN LEVEL (UNFRACTIONATED): Heparin Unfractionated: 0.16 IU/mL — ABNORMAL LOW (ref 0.30–0.70)

## 2014-10-31 LAB — TROPONIN I: Troponin I: <0.03 ng/mL (ref <0.031)

## 2014-10-31 SURGERY — RIGHT/LEFT HEART CATH AND CORONARY ANGIOGRAPHY
Anesthesia: LOCAL

## 2014-10-31 MED ORDER — HEPARIN BOLUS VIA INFUSION
2000.0000 [IU] | Freq: Once | INTRAVENOUS | Status: AC
  Administered 2014-10-31: 2000 [IU] via INTRAVENOUS
  Filled 2014-10-31: qty 2000

## 2014-10-31 MED ORDER — SODIUM CHLORIDE 0.9 % WEIGHT BASED INFUSION
1.0000 mL/kg/h | INTRAVENOUS | Status: AC
  Administered 2014-10-31 (×2): 1 mL/kg/h via INTRAVENOUS

## 2014-10-31 MED ORDER — VERAPAMIL HCL 2.5 MG/ML IV SOLN
INTRAVENOUS | Status: DC | PRN
  Administered 2014-10-31: 11:00:00 via INTRA_ARTERIAL

## 2014-10-31 MED ORDER — IOHEXOL 350 MG/ML SOLN
INTRAVENOUS | Status: DC | PRN
  Administered 2014-10-31: 40 mL via INTRA_ARTERIAL

## 2014-10-31 MED ORDER — SODIUM CHLORIDE 0.9 % IJ SOLN: 3.0000 mL | INTRAMUSCULAR | Status: DC | PRN

## 2014-10-31 MED ORDER — CALCIUM CARBONATE-VITAMIN D 500-200 MG-UNIT PO TABS
2.0000 | ORAL_TABLET | ORAL | Status: DC
  Administered 2014-11-01 – 2014-11-02 (×2): 2 via ORAL
  Filled 2014-10-31 (×2): qty 2

## 2014-10-31 MED ORDER — SODIUM CHLORIDE 0.9 % IJ SOLN
3.0000 mL | Freq: Two times a day (BID) | INTRAMUSCULAR | Status: DC
  Administered 2014-10-31 – 2014-11-02 (×4): 3 mL via INTRAVENOUS

## 2014-10-31 MED ORDER — SODIUM CHLORIDE 0.9 % IV SOLN: 250.0000 mL | INTRAVENOUS | Status: DC | PRN

## 2014-10-31 MED ORDER — ONDANSETRON HCL 4 MG/2ML IJ SOLN: 4.0000 mg | Freq: Four times a day (QID) | INTRAMUSCULAR | Status: DC | PRN

## 2014-10-31 MED ORDER — ACETAMINOPHEN 325 MG PO TABS
650.0000 mg | ORAL_TABLET | ORAL | Status: DC | PRN
  Administered 2014-10-31 – 2014-11-02 (×2): 650 mg via ORAL
  Filled 2014-10-31 (×2): qty 2

## 2014-10-31 MED ORDER — LIDOCAINE HCL (PF) 1 % IJ SOLN
INTRAMUSCULAR | Status: DC | PRN
  Administered 2014-10-31: 10 mL via INTRADERMAL
  Administered 2014-10-31: 5 mL via INTRADERMAL

## 2014-10-31 MED ORDER — HEPARIN SODIUM (PORCINE) 1000 UNIT/ML IJ SOLN
INTRAMUSCULAR | Status: DC | PRN
  Administered 2014-10-31: 6000 [IU] via INTRAVENOUS

## 2014-10-31 SURGICAL SUPPLY — 15 items
CATH BALLN WEDGE 5F 110CM (CATHETERS) ×2 IMPLANT
CATH INFINITI 5 FR JL3.5 (CATHETERS) ×2 IMPLANT
CATH INFINITI 5FR ANG PIGTAIL (CATHETERS) ×2 IMPLANT
CATH INFINITI JR4 5F (CATHETERS) ×2 IMPLANT
DEVICE RAD COMP TR BAND LRG (VASCULAR PRODUCTS) ×2 IMPLANT
GLIDESHEATH SLEND SS 6F .021 (SHEATH) ×2 IMPLANT
HOVERMATT SINGLE USE (MISCELLANEOUS) ×2 IMPLANT
KIT HEART LEFT (KITS) ×2 IMPLANT
KIT HEART RIGHT NAMIC (KITS) ×2 IMPLANT
PACK CARDIAC CATHETERIZATION (CUSTOM PROCEDURE TRAY) ×2 IMPLANT
SHEATH FAST CATH BRACH 5F 5CM (SHEATH) ×2 IMPLANT
SYR MEDRAD MARK V 150ML (SYRINGE) ×2 IMPLANT
TRANSDUCER W/STOPCOCK (MISCELLANEOUS) ×4 IMPLANT
TUBING CIL FLEX 10 FLL-RA (TUBING) ×2 IMPLANT
WIRE SAFE-T 1.5MM-J .035X260CM (WIRE) ×2 IMPLANT

## 2014-10-31 NOTE — Progress Notes (Signed)
Pt arrived on unit post cardiac cath procedure.  Removed 17cc air from TR Band without incident or complications.  At 1600, removed TR Band and placed Tegaderm, at Level 0.

## 2014-10-31 NOTE — Progress Notes (Addendum)
Called CCMD to verify and confirmed that pt was showing on telemetry after returning to unit following cardiac cath procedure.

## 2014-10-31 NOTE — Interval H&P Note (Signed)
History and Physical Interval Note:Cath Lab Visit (complete for each Cath Lab visit)  Clinical Evaluation Leading to the Procedure:   ACS: Yes.    Non-ACS:    Anginal Classification: CCS IV  Anti-ischemic medical therapy: Minimal Therapy (1 class of medications)  Non-Invasive Test Results: No non-invasive testing performed  Prior CABG: No previous CABG   TIMI SCORE  Patient Information:  TIMI Score is 3  UA/NSTEMI and intermediate-risk features (e.g., TIMI score 3?4) for short-term risk of death or nonfatal MI  Revascularization of the presumed culprit artery   A (9)  Indication: 10; Score: 9   SHOB with known lung disease.    10/31/2014 10:45 AM  [name]  has presented today for surgery, with the diagnosis of cp  The various methods of treatment have been discussed with the patient and family. After consideration of risks, benefits and other options for treatment, the patient has consented to  Procedure(s): Right/Left Heart Cath and Coronary Angiography (N/A) as a surgical intervention .  The patient's history has been reviewed, patient examined, no change in status, stable for surgery.  I have reviewed the patient's chart and labs.  Questions were answered to the patient's satisfaction.     Ashayla Subia S.

## 2014-10-31 NOTE — Progress Notes (Signed)
Patient Name: [name] Date of Encounter: 10/31/2014  Cardiologist: Dr. 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 Denies significant CP, describe it as chest pressure at rest with some SOB.   CURRENT MEDS . allopurinol  200 mg Oral Daily  . aspirin EC  81 mg Oral Daily  . atorvastatin  10 mg Oral QPM  . Calcium + D3  2 tablet Oral Q lunch  . cholecalciferol  2,000 Units Oral Q lunch  . DULoxetine  120 mg Oral QPM  . gabapentin  1,200 mg Oral Daily   And  . gabapentin  1,200 mg Oral Daily   And  . gabapentin  1,800 mg Oral QPM  . heparin  4,000 Units Intravenous Once  . insulin aspart  0-9 Units Subcutaneous TID WC  . levothyroxine  50 mcg Oral QAC breakfast  . linagliptin  5 mg Oral q morning - 10a  . metoprolol succinate  100 mg Oral QPC breakfast  . multivitamin with minerals  1 tablet Oral Q lunch  . mycophenolate  500 mg Oral BID  . pantoprazole  40 mg Oral QAC breakfast  . predniSONE  10 mg Oral Q breakfast  . sodium chloride  3 mL Intravenous Q12H  . sulfamethoxazole-trimethoprim  1 tablet Oral Once per day on Mon Wed Fri  . traMADol  100 mg Oral q12n4p    OBJECTIVE  Filed Vitals:   10/30/14 1821 10/30/14 2100 10/31/14 0531 10/31/14 0842  BP: 127/73 92/55 108/72 103/49  Pulse: 95 93 87 90  Temp: 97.6 F (36.4 C) 98.4 F (36.9 C) 98.5 F (36.9 C)   TempSrc: Oral Oral Oral   Resp: _0 Height:      Weight:   286 lb 4.8 oz (129.865 kg)   SpO2: 100% 99% 98%     Intake/Output Summary (Last 24 hours) at 10/31/14 0926 Last data filed at 10/31/14 0842  Gross per 24 hour  Intake 662.87 ml  Output    470 ml  Net 192.87 ml     Filed Weights   10/30/14 1530 10/31/14 0531  Weight: 285 lb 6.4 oz (129.457 kg) 286 lb 4.8 oz (129.865 kg)    PHYSICAL EXAM  General: Pleasant, NAD. Morbid obesisty Neuro: Alert and oriented X 3. Moves all extremities spontaneously. Psych: Normal affect. HEENT:  Normal  Neck: Supple without bruits or JVD. Lungs:  Resp regular and unlabored, CTA. No obvious rale. Heart: RRR no s3, s4, or murmurs. Abdomen: Soft, non-tender, non-distended, BS + x 4.  Extremities: No clubbing, cyanosis or edema. DP/PT/Radials 2+ and equal bilaterally.  Accessory Clinical Findings  CBC  Recent Labs  10/31/14 0415  WBC 7.5  HGB 12.1  HCT 38.7  MCV 94.9  PLT 119   Basic Metabolic Panel  Recent Labs  10/30/14 1725 10/31/14 0415  NA 139 142  K 3.0* 3.7  CL 97* 99*  CO2 34* 34*  GLUCOSE 226* 149*  BUN 31* 31*  CREATININE 1.65* 1.36*  CALCIUM 8.9 8.8*  MG 1.7  --    Liver Function Tests  Recent Labs  10/30/14 1725  AST 21  ALT 28  ALKPHOS 70  BILITOT 0.6  PROT 5.9*  ALBUMIN 3.2*   Cardiac Enzymes  Recent Labs  10/30/14 1725 10/30/14 2245 10/31/14 0415  TROPONINI <0.03 <0.03 <0.03   Hemoglobin A1C  Recent Labs  10/30/14 1725  HGBA1C 7.0*   Fasting Lipid Panel  Recent Labs  10/31/14 0415  CHOL 168  HDL 49  LDLCALC 102*  TRIG 87  CHOLHDL 3.4   Thyroid Function Tests  Recent Labs  10/30/14 1725  TSH 3.783    TELE NSR without significant ventricular ectopy    ECG  NSR without significant ST-T wave changes  Echocardiogram 11/02/2013  LV EF: 55% -  60%  ------------------------------------------------------------------- Indications:   786.05 Dyspnea.  ------------------------------------------------------------------- History:  PMH: Edema, Asthma, Interstitial Lung Disease. Chest pain. Risk factors: Family history of coronary artery disease. Hypertension. Diabetes mellitus.  Dyslipidemia.  ------------------------------------------------------------------- Study Conclusions  - Left ventricle: The cavity size was normal. Systolic function was normal. The estimated ejection fraction was in the range of 55% to 60%. Although no diagnostic regional wall motion abnormality was identified, this possibility cannot be completely excluded on the basis of this study. Doppler parameters are consistent with abnormal left ventricular relaxation (grade 1 diastolic dysfunction).    Radiology/Studies  Dg Op Swallowing Func-medicare/speech Path  10/11/2014   CLINICAL DATA:  Dysphagia. Requiring Heimlich maneuver x2 after swallowing pills. Initial encounter.  EXAM: MODIFIED BARIUM SWALLOW  TECHNIQUE: Different consistencies of barium were administered orally to the patient by the Speech Pathologist. Imaging of the pharynx was performed in the lateral projection.  FLUOROSCOPY TIME:  Radiation Exposure Index (as provided by the fluoroscopic device):  If the device does not provide the exposure index:  Fluoroscopy Time:  1 minutes and 49 seconds.  Number of Acquired Images:  COMPARISON:  Chest CT 07/20/2014.  FINDINGS: Thin liquid- flash penetration with large boluses. No aspiration or other significant abnormality.  Nectar thick liquid- single episode of flash penetration. No aspiration or other significant abnormality.  Honey- not administered  Puree- mild pooling in the valleculae and piriform sinuses. No penetration or aspiration.  Puree with cracker- mild pooling in the valleculae and piriform sinuses. No penetration or aspiration.  Barium tablet -  taken with thin barium, this passed without delay.  Of note, the patient has prominent anterior osteophytes in the lower cervical spine consistent with diffuse idiopathic skeletal hyperostosis. This may contribute to extrinsic mass effect on the proximal esophagus.  IMPRESSION: 1. Flash penetration with thin and nectar thick  barium. No aspiration. 2. Mild pooling in the valleculae and piriform sinuses with solids. 3. Diffuse idiopathic skeletal hyperostosis.  Please refer to the Speech Pathologists report for complete details and recommendations.   Electronically Signed   By: Richardean Sale M.D.   On: 10/11/2014 13:48   Dg Chest Port 1 View  10/30/2014   CLINICAL DATA:  Chest pressure and shortness of breath for 2-3 days.  EXAM: PORTABLE CHEST - 1 VIEW  COMPARISON:  CT chest 07/20/2014. PA and lateral chest 06/06/2014 and 03/20/2013.  FINDINGS: The lungs are grossly clear. Heart size is normal. No pneumothorax or pleural effusion. The study is somewhat limited by the patient's size and portable technique.  IMPRESSION: No acute disease.   Electronically Signed   By: Inge Rise M.D.   On: 10/30/2014 19:33    ASSESSMENT AND PLAN  1. [place] with chest pressure and DOE  - negative nuc on 11/04/2013, however continue to be symptomatic  -  CT of chest 07/20/2014 consistent with bronchiectasis and interstial lung dx/pulm fibrosis, calcification noted in LAD distribution. Unclear if symptom due to lung dx vs ischemia which may be missed on nuc  - pending L&RHC today, risk and benefit explained by Dr. Debara Pickett  2. Chronic diastolic HF   - difficult to assess fluid level given morbid obesity, no obvious rale on exam   - holding lasix pending RHC result  3. HTN 4. HLD 5. DM 6. RA on cell cept and steroids.  7. Morbid Obesity   Signed, Woodward Ku Pager: [phone]  I have seen and examined the patient along with Almyra Deforest PA-C.  I have reviewed the chart, notes and new data.  I agree with PA's note.  Cardiac cath showed mild nonobstructive CAD and minimally elevated filling pressures (PAWP mean 16 mm Hg, LVEDP 12-20 mm Hg).  PLAN: She describes problems at rest. Doubt symptoms are cardiac in etiology, minimal evidence for CHF (at least at rest). While pressures may increase significantly with activity, it is more likely  symptoms are related to pulmonary disease and obesity.   Sanda Klein, MD, [place] [phone] 10/31/2014, 5:04 PM

## 2014-10-31 NOTE — Progress Notes (Signed)
ANTICOAGULATION CONSULT NOTE - Follow Up Consult  Pharmacy Consult for Heparin  Indication: chest pain/ACS  Allergies  Allergen Reactions  . Remicade [Infliximab] Anaphylaxis    "sent me into shock"  . Codeine Swelling    Facial swelling  . Zestril [Lisinopril] Swelling    Face and neck swelling    Patient Measurements: Height: 5' 4" (162.6 cm) Weight: 285 lb 6.4 oz (129.457 kg) IBW/kg (Calculated) : 54.7  Vital Signs: Temp: 98.4 F (36.9 C) (06/13 2100) Temp Source: Oral (06/13 2100) BP: 92/55 mmHg (06/13 2100) Pulse Rate: 93 (06/13 2100)  Labs:  Recent Labs  10/30/14 1725 10/30/14 2245 10/31/14 0106  APTT 25  --   --   LABPROT 14.8  --   --   INR 1.14  --   --   HEPARINUNFRC  --   --  0.16*  CREATININE 1.65*  --   --   TROPONINI <0.03 <0.03  --     Estimated Creatinine Clearance: 41.2 mL/min (by C-G formula based on Cr of 1.65).    Assessment: Sub-therapeutic heparin level, no issues per RN.   Goal of Therapy:  Heparin level 0.3-0.7 units/ml Monitor platelets by anticoagulation protocol: Yes   Plan:  -Heparin 2000 units BOLUS -Increase heparin to 1400 units/hr -1000 HL -Daily CBC/HL -Monitor for bleeding  Narda Bonds 10/31/2014,2:10 AM

## 2014-11-01 DIAGNOSIS — R079 Chest pain, unspecified: Secondary | ICD-10-CM

## 2014-11-01 LAB — CBC
HCT: 36.7 % (ref 36.0–46.0)
Hemoglobin: 11.6 g/dL — ABNORMAL LOW (ref 12.0–15.0)
MCH: 30 pg (ref 26.0–34.0)
MCHC: 31.6 g/dL (ref 30.0–36.0)
MCV: 94.8 fL (ref 78.0–100.0)
Platelets: 161 10*3/uL (ref 150–400)
RBC: 3.87 MIL/uL (ref 3.87–5.11)
RDW: 14.6 % (ref 11.5–15.5)
WBC: 6.9 10*3/uL (ref 4.0–10.5)

## 2014-11-01 LAB — BASIC METABOLIC PANEL
Anion gap: 7 (ref 5–15)
BUN: 24 mg/dL — ABNORMAL HIGH (ref 6–20)
CO2: 32 mmol/L (ref 22–32)
Calcium: 8.5 mg/dL — ABNORMAL LOW (ref 8.9–10.3)
Chloride: 99 mmol/L — ABNORMAL LOW (ref 101–111)
Creatinine, Ser: 1.24 mg/dL — ABNORMAL HIGH (ref 0.44–1.00)
GFR calc Af Amer: 49 mL/min — ABNORMAL LOW (ref >60)
GFR, EST NON AFRICAN AMERICAN: 42 mL/min — AB (ref >60)
GLUCOSE: 161 mg/dL — AB (ref 65–99)
Potassium: 4 mmol/L (ref 3.5–5.1)
Sodium: 138 mmol/L (ref 135–145)

## 2014-11-01 LAB — GLUCOSE, CAPILLARY
GLUCOSE-CAPILLARY: 150 mg/dL — AB (ref 65–99)
Glucose-Capillary: 180 mg/dL — ABNORMAL HIGH (ref 65–99)
Glucose-Capillary: 176 mg/dL — ABNORMAL HIGH (ref 65–99)
Glucose-Capillary: 146 mg/dL — ABNORMAL HIGH (ref 65–99)

## 2014-11-01 MED ORDER — FUROSEMIDE 10 MG/ML IJ SOLN
40.0000 mg | Freq: Two times a day (BID) | INTRAMUSCULAR | Status: DC
  Administered 2014-11-01 – 2014-11-02 (×4): 40 mg via INTRAVENOUS
  Filled 2014-11-01 (×4): qty 4

## 2014-11-01 MED FILL — Heparin Sodium (Porcine) 2 Unit/ML in Sodium Chloride 0.9%: INTRAMUSCULAR | Qty: 1500 | Status: AC

## 2014-11-01 NOTE — Progress Notes (Signed)
Subjective: She reports intermittent tightness which comes projects around from her back and around her lower ribs/chest.   Objective: Vital signs in last 24 hours: Temp:  [97.4 F (36.3 C)-98.3 F (36.8 C)] 97.4 F (36.3 C) (06/15 1000) Pulse Rate:  [82-99] 93 (06/15 1000) Resp:  [8-18] 16 (06/15 0635) BP: (112-145)/(66-84) 125/74 mmHg (06/15 1000) SpO2:  [89 %-100 %] 97 % (06/15 1000) Weight:  [289 lb 8 oz (131.316 kg)] 289 lb 8 oz (131.316 kg) (06/15 0635) Last BM Date: 10/30/14  Intake/Output from previous day: 06/14 0701 - 06/15 0700 In: 2384.2 [P.O.:907; I.V.:1477.2] Out: 1300 [Urine:1300] Intake/Output this shift: Total I/O In: 480 [P.O.:480] Out: -   Medications Scheduled Meds: . allopurinol  200 mg Oral Daily  . aspirin EC  81 mg Oral Daily  . atorvastatin  10 mg Oral QPM  . calcium-vitamin D  2 tablet Oral Q24H  . cholecalciferol  2,000 Units Oral Q lunch  . DULoxetine  120 mg Oral QPM  . gabapentin  1,200 mg Oral Daily   And  . gabapentin  1,200 mg Oral Daily   And  . gabapentin  1,800 mg Oral QPM  . insulin aspart  0-9 Units Subcutaneous TID WC  . levothyroxine  50 mcg Oral QAC breakfast  . linagliptin  5 mg Oral q morning - 10a  . metoprolol succinate  100 mg Oral QPC breakfast  . multivitamin with minerals  1 tablet Oral Q lunch  . mycophenolate  500 mg Oral BID  . pantoprazole  40 mg Oral QAC breakfast  . predniSONE  10 mg Oral Q breakfast  . sodium chloride  3 mL Intravenous Q12H  . sulfamethoxazole-trimethoprim  1 tablet Oral Once per day on Mon Wed Fri  . traMADol  100 mg Oral q12n4p   Continuous Infusions: . sodium chloride     PRN Meds:.sodium chloride, acetaminophen, albuterol, ALPRAZolam, morphine, nitroGLYCERIN, ondansetron (ZOFRAN) IV, sodium chloride  PE: General appearance: alert, cooperative and no distress Lungs: Decreased BS throughout but no wheeze or rales Heart: regular rate and rhythm, S1, S2 normal, no murmur, click,  rub or gallop Abdomen: soft, non-tender; bowel sounds normal; no masses,  no organomegaly and +BS, uncomfortable with palpation centrally Extremities: 1-2+ LEE Pulses: 2+ and symmetric Skin: Warm and dry.  Mild ecchymosis at right radial cath site.  Neurologic: Grossly normal  Lab Results:   Recent Labs  10/31/14 0415 11/01/14 0246  WBC 7.5 6.9  HGB 12.1 11.6*  HCT 38.7 36.7  PLT 176 161   BMET  Recent Labs  10/30/14 1725 10/31/14 0415 11/01/14 0246  NA 139 142 138  K 3.0* 3.7 4.0  CL 97* 99* 99*  CO2 34* 34* 32  GLUCOSE 226* 149* 161*  BUN 31* 31* 24*  CREATININE 1.65* 1.36* 1.24*  CALCIUM 8.9 8.8* 8.5*   PT/INR  Recent Labs  10/30/14 1725  LABPROT 14.8  INR 1.14   Cholesterol  Recent Labs  10/31/14 0415  CHOL 168   Lipid Panel     Component Value Date/Time   CHOL 168 10/31/2014 0415   TRIG 87 10/31/2014 0415   HDL 49 10/31/2014 0415   CHOLHDL 3.4 10/31/2014 0415   VLDL 17 10/31/2014 0415   LDLCALC 102* 10/31/2014 0415   Cardiac Panel (last 3 results)  Recent Labs  10/30/14 1725 10/30/14 2245 10/31/14 0415  TROPONINI <0.03 <0.03 <0.03     Assessment/Plan  1. [name] with chest pressure and DOE SP R?L  heart caths.  Mild CAD.  Ectasia proximally in all three major vessels.  Mild Pulm HTN, Aortic saturation 87%. Pulmonary artery saturation 60%. Cardiac output 7.6 L. Cardiac index 3.3.  LVEDP 20 - negative nuc on 11/04/2013, however continue to be symptomatic - CT of chest 07/20/2014 consistent with bronchiectasis and interstial lung dx/pulm fibrosis, calcification noted in LAD distribution. Unclear if symptom due to lung dx vs ischemia which may be missed on nuc   2. Chronic diastolic HF -Net fluids: +9.3A.  Weight up.  Elevated LVEDP.  Giving IV lasix40 mg BID.  SCr improved     No rales or wheezing on exam.   I do not think she needs a pulmonary consult.    3. HTN  Stable. Toprol 100,  4.  HLD  Statin 5. DM 6. RA on cell cept and steroids.  7. Morbid Obesity    LOS: 2 days    [name], BRYAN PA-C 11/01/2014 10:45 AM    Agree with note written by Luisa Dago PAC  S/P R/L heart cath. Min CAD. Mild Pulm HTN with slightly elevated filling pressures. Non Cardiac CP. Agree with short course IV lasix. Transition to PO 40 mg/d on D/C (was 20 mg at home). Labs OK. Nl Scr. Prob home AM. Can follow up with Dr Chase Caller as OP.  Quay Burow 11/01/2014 1:01 PM

## 2014-11-01 NOTE — Progress Notes (Signed)
UR COMPLETED

## 2014-11-02 ENCOUNTER — Other Ambulatory Visit: Payer: Self-pay | Admitting: Cardiology

## 2014-11-02 DIAGNOSIS — N289 Disorder of kidney and ureter, unspecified: Secondary | ICD-10-CM

## 2014-11-02 DIAGNOSIS — I10 Essential (primary) hypertension: Secondary | ICD-10-CM

## 2014-11-02 DIAGNOSIS — J849 Interstitial pulmonary disease, unspecified: Secondary | ICD-10-CM

## 2014-11-02 DIAGNOSIS — Z0389 Encounter for observation for other suspected diseases and conditions ruled out: Secondary | ICD-10-CM

## 2014-11-02 DIAGNOSIS — R0609 Other forms of dyspnea: Secondary | ICD-10-CM

## 2014-11-02 DIAGNOSIS — IMO0001 Reserved for inherently not codable concepts without codable children: Secondary | ICD-10-CM

## 2014-11-02 LAB — GLUCOSE, CAPILLARY
GLUCOSE-CAPILLARY: 123 mg/dL — AB (ref 65–99)
Glucose-Capillary: 160 mg/dL — ABNORMAL HIGH (ref 65–99)
Glucose-Capillary: 182 mg/dL — ABNORMAL HIGH (ref 65–99)

## 2014-11-02 LAB — CBC
HCT: 37.6 % (ref 36.0–46.0)
Hemoglobin: 11.4 g/dL — ABNORMAL LOW (ref 12.0–15.0)
MCH: 28.8 pg (ref 26.0–34.0)
MCHC: 30.3 g/dL (ref 30.0–36.0)
MCV: 94.9 fL (ref 78.0–100.0)
PLATELETS: 157 10*3/uL (ref 150–400)
RBC: 3.96 MIL/uL (ref 3.87–5.11)
RDW: 14.4 % (ref 11.5–15.5)
WBC: 8.1 10*3/uL (ref 4.0–10.5)

## 2014-11-02 MED ORDER — FUROSEMIDE 40 MG PO TABS: 40.0000 mg | ORAL_TABLET | Freq: Two times a day (BID) | ORAL | Status: DC

## 2014-11-02 MED ORDER — FUROSEMIDE 40 MG PO TABS: 40.0000 mg | ORAL_TABLET | Freq: Every day | ORAL | Status: DC

## 2014-11-02 MED ORDER — SULFAMETHOXAZOLE-TRIMETHOPRIM 800-160 MG PO TABS: 1.0000 | ORAL_TABLET | ORAL | Status: DC

## 2014-11-02 MED ORDER — PREDNISONE 10 MG PO TABS: 20.0000 mg | ORAL_TABLET | Freq: Every day | ORAL | Status: DC

## 2014-11-02 MED ORDER — FAMOTIDINE 20 MG PO TABS
20.0000 mg | ORAL_TABLET | Freq: Every day | ORAL | Status: DC
  Filled 2014-11-02: qty 1

## 2014-11-02 MED ORDER — PREDNISONE 20 MG PO TABS
20.0000 mg | ORAL_TABLET | Freq: Every day | ORAL | Status: DC
  Filled 2014-11-02: qty 1

## 2014-11-02 MED ORDER — FAMOTIDINE 20 MG PO TABS: 20.0000 mg | ORAL_TABLET | Freq: Every day | ORAL | Status: DC

## 2014-11-02 MED ORDER — DULOXETINE HCL 60 MG PO CPEP: 120.0000 mg | ORAL_CAPSULE | Freq: Every evening | ORAL | Status: DC

## 2014-11-02 NOTE — Discharge Summary (Signed)
Patient ID: [name],  MRN: [id number], DOB/AGE: 73-Oct-1942 73 y.o.  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 Morbidly obese, AA female who was seen by Dr Debara Pickett 10/30/14 for edema. She was seen by her pulmonologist and found to have volume overload, Our ofice was notified and she took extra lasix for 3 days. She also has chest pressure that comes and goes. Very fatigued, DOE. She had to stay in bed 3 days to make it to this appt. Her lower ext edema is improved with higher dose of lasix. She has CKD stage 3. She is tearful today because of her symptoms. She understands she has interstital lung disease and is on steroids.   She does have a strong family history of heart disease. Her mother and 4 brothers have a history of ischemic heart disease. She has never had a heart attack or stroke. She does have interstitial lung disease as well as rheumatoid arthritis. She is on home oxygen. She also has a brain tumor/meningioma s/p gamma knife therapy.  Previously she was evaluated by a cardiologist with a limited 2-D echocardiogram and astress test and was told that these were normal. She's had progressive disabling dyspnea as well as daily chest pain. She was evaluated by Dr. Adora Fridge in June of this year and had normal Echo except for grade 1 diastolic dysfunction and Normal stress test.   Her symptoms have progressed. Her CT angio of the chest does reveal LAD  disease.She was admitted for IV diuresis and coronary angiogram. This was done 11/01/14 and revealed minor CAD. Dr Gwenlyn Found saw her on the 21 th and feels she can be discharged. He increased her daily Lasix. She will need a BMP next week and a TOC f/u in 7-10 days. The pt's daughter asked that her mother be seen by pulmonary prior to discharge and the pt was seen by Dr Lake Bells. He suggested increasing her steroids and adding QHs Pepcid. They will follow up as an OP as well.   Discharge Vitals:  Blood pressure 128/90, pulse 93, temperature 98 F (36.7 C), temperature source Oral, resp. rate 18, height _0  (1.626 m), weight 290 lb 11.2 oz (131.861 kg), SpO2 96 %.    Labs: Results for orders placed or performed during the hospital encounter of 10/30/14 (from the past 24 hour(s))  Glucose, capillary     Status: Abnormal   Collection Time: 11/01/14  4:19 PM  Result Value Ref Range   Glucose-Capillary 176 (H) 65 - 99 mg/dL   Comment 1 Notify RN   Glucose, capillary     Status: Abnormal   Collection Time: 11/01/14  9:44 PM  Result Value Ref Range   Glucose-Capillary 146 (H) 65 - 99 mg/dL   Comment 1 Notify RN    Comment 2 Document in Chart   CBC     Status: Abnormal   Collection Time: 11/02/14  5:34 AM  Result Value Ref Range   WBC 8.1 4.0 -  10.5 K/uL   RBC 3.96 3.87 - 5.11 MIL/uL   Hemoglobin 11.4 (L) 12.0 - 15.0 g/dL   HCT 37.6 36.0 - 46.0 %   MCV 94.9 78.0 - 100.0 fL   MCH 28.8 26.0 - 34.0 pg   MCHC 30.3 30.0 - 36.0 g/dL   RDW 14.4 11.5 - 15.5 %   Platelets 157 150 - 400 K/uL  Glucose, capillary     Status: Abnormal   Collection Time: 11/02/14  6:11 AM  Result Value Ref Range   Glucose-Capillary 123 (H) 65 - 99 mg/dL  Glucose, capillary     Status: Abnormal   Collection Time: 11/02/14 11:06 AM  Result Value Ref Range   Glucose-Capillary 160 (H) 65 - 99 mg/dL   Comment 1 Notify RN     Disposition:  Follow-up Information    Follow up with [facility], [place].   Specialty:   Pulmonary Disease   Why:  call for an appointment   Contact information:   [place] [address] [phone]       Follow up with Quay Burow, MD.   Specialties:  Cardiology, Radiology   Why:  offcie will call you   Contact information:   [address] [place]  [address] [phone]       Discharge Medications:    Medication List    TAKE these medications        ACCU-CHEK SMARTVIEW test strip  Generic drug:  glucose blood  1 each by Other route every morning.     acetaminophen 500 MG tablet  Commonly known as:  TYLENOL  Take 1,000 mg by mouth 2 (two) times daily as needed for pain. Takes with tramadol     AEROCHAMBER MV inhaler  Use as instructed     albuterol 108 (90 BASE) MCG/ACT inhaler  Commonly known as:  PROVENTIL HFA;VENTOLIN HFA  Inhale 2 puffs into the lungs every 6 (six) hours as needed for wheezing or shortness of breath.     allopurinol 100 MG tablet  Commonly known as:  ZYLOPRIM  Take 2 tablets (200 mg total) by mouth daily.     aspirin EC 81 MG tablet  Take 81 mg by mouth every morning.     atorvastatin 20 MG tablet  Commonly known as:  LIPITOR  Take 10 mg by mouth every evening.     BENICAR HCT 40-25 MG per tablet  Generic drug:  olmesartan-hydrochlorothiazide  Take 1 tablet by mouth every morning.     CALCIUM + D3 PO  Take 2 tablets by mouth daily with lunch.     cholecalciferol 1000 UNITS tablet  Commonly known as:  VITAMIN D  Take 2,000 Units by mouth daily with lunch.     colchicine 0.6 MG tablet  Take 0.6 mg by mouth daily as needed (GOUT).     dextromethorphan 30 MG/5ML liquid  Commonly known as:  DELSYM  Take 30 mg by mouth daily as needed for cough.     diclofenac sodium 1 % Gel  Commonly known as:  VOLTAREN  Apply 1 application topically 3 (three) times daily. To right knee     DULoxetine 60 MG capsule  Commonly known as:  CYMBALTA  Take 2 capsules (120 mg total) by mouth every  evening.     furosemide 40 MG tablet  Commonly known as:  LASIX  Take 1 tablet (40 mg total) by mouth 2 (two) times daily.     gabapentin 600 MG  tablet  Commonly known as:  NEURONTIN  Take 1 tablet (600 mg total) by mouth as directed. Take 2 tablet in the morning, 2 tablet in the middle of the day, and 3 tablets at night     levothyroxine 50 MCG tablet  Commonly known as:  SYNTHROID, LEVOTHROID  Take 1 tablet (50 mcg total) by mouth daily.     linagliptin 5 MG Tabs tablet  Commonly known as:  TRADJENTA  Take 5 mg by mouth every morning.     metoprolol succinate 100 MG 24 hr tablet  Commonly known as:  TOPROL-XL  Take 100 mg by mouth every morning. Take with or immediately following a meal.     morphine 15 MG tablet  Commonly known as:  MSIR  Take 1 tablet (15 mg total) by mouth every 6 (six) hours as needed for moderate pain.     multivitamin with minerals Tabs tablet  Take 1 tablet by mouth daily with lunch.     mycophenolate 500 MG tablet  Commonly known as:  CELLCEPT  Take 500 mg by mouth 2 (two) times daily.     OXYGEN  Inhale 2 L into the lungs at bedtime. And on exertion     pantoprazole 40 MG tablet  Commonly known as:  PROTONIX  Take 40 mg by mouth daily before breakfast.     predniSONE 10 MG tablet  Commonly known as:  DELTASONE  Take 10 mg by mouth daily with breakfast.     sulfamethoxazole-trimethoprim 800-160 MG per tablet  Commonly known as:  BACTRIM DS,SEPTRA DS  Take 1 tablet by mouth 3 (three) times a week. Mon, Wed, Fri     traMADol 100 MG 24 hr tablet  Commonly known as:  ULTRAM-ER  Take 1 tablet (100 mg total) by mouth 2 times daily at 12 noon and 4 pm.         Duration of Discharge Encounter: Greater than 30 minutes including physician time.  Angelena Form PA-C 11/02/2014 2:12 PM

## 2014-11-02 NOTE — Consult Note (Signed)
Name: [name] MRN: [id number] DOB: Jul 04, 1940    ADMISSION DATE:  10/30/2014 CONSULTATION DATE:  11/02/14  REFERRING MD :  [facility]   CHIEF COMPLAINT:  Dyspnea   BRIEF PATIENT DESCRIPTION: 74 y/o F with a past medical history significant for rheumatoid arthritis associated interstitial lung disease as well as diastolic heart failure was admitted on 10/30/2014 with increasing leg swelling and shortness of breath.  SIGNIFICANT EVENTS    STUDIES:     HISTORY OF PRESENT ILLNESS:  This is a patient of [name] who has rheumatoid arthritis associated interstitial lung disease who was admitted on 10/30/2014 with increasing shortness of breath and leg swelling. She was admitted to the cardiology service and she has been receiving diuresis as an inpatient. Her shortness of breath has improved slightly but she remains dyspneic. Pulmonary and critical care medicine was consulted on 11/02/2014 for further evaluation of dyspnea. She states that for the last year her dyspnea has been slowly progressive. During this time she has had a decline in functional symptoms. About one year ago she was able to walk through grocery store with a boggy, however about 6-8 months ago she started to have to use the motorized scooter. In the last 6 months she has remained largely bedbound throughout the course of the day with very minimal exercise. Just walking 10-15 feet causes very severe s shortness of breath.  She has participated in pulmonary rehabilitation in the past but it has been several years. Currently she says that she has mucus which hangs up in the top of her throat but she has not had increasing wheezing. She has experienced increasing chest pain and tightness which is like a band around the middle of her chest and she has undergone a stress test to evaluate this. Also, she had an endoscopy several months ago which showed evidence of gastroesophageal reflux disease. In April 2016 she had a  high resolution CT chest which showed worsening interstitial lung disease.       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  has past surgical history that includes Abdominal hysterectomy; Ovary surgery; Shoulder surgery (Left); Brain surgery; Video bronchoscopy (Bilateral, 05/31/2013); video bronscoscopy (2000); Tonsillectomy (age 87); Esophagogastroduodenoscopy (egd) with propofol (N/A, 09/14/2014); and Cardiac catheterization (N/A, 10/31/2014).   Prior to Admission medications   Medication Sig Start Date End Date Taking? Authorizing Provider  ACCU-CHEK SMARTVIEW test strip 1 each by Other route every morning.  03/18/14  Yes Historical Provider, MD  acetaminophen (TYLENOL) 500 MG tablet Take 1,000 mg by mouth 2 (two) times daily as needed for pain. Takes with tramadol   Yes Historical Provider, MD  albuterol (PROVENTIL HFA;VENTOLIN HFA) 108 (90 BASE) MCG/ACT inhaler Inhale 2 puffs into the lungs every 6 (six) hours as needed for wheezing or shortness of breath.   Yes Historical Provider, MD  allopurinol (ZYLOPRIM) 100 MG tablet Take 2 tablets (200 mg total) by mouth daily. 06/21/14  Yes Meredith Staggers, MD  aspirin EC 81 MG tablet Take 81 mg by mouth every morning.    Yes Historical Provider, MD  atorvastatin (LIPITOR) 20 MG tablet  Take 10 mg by mouth every evening.    Yes Historical Provider, MD  BENICAR HCT 40-25 MG per tablet Take 1 tablet by mouth every morning.  05/04/14  Yes Historical Provider, MD  Calcium  Carb-Cholecalciferol (CALCIUM + D3 PO) Take 2 tablets by mouth daily with lunch.    Yes Historical Provider, MD  cholecalciferol (VITAMIN D) 1000 UNITS tablet Take 2,000 Units by mouth daily with lunch.    Yes Historical Provider, MD  colchicine 0.6 MG tablet Take 0.6 mg by mouth daily as needed (GOUT).   Yes Historical Provider, MD  dextromethorphan (DELSYM) 30 MG/5ML liquid Take 30 mg by mouth daily as needed for cough.    Yes Historical Provider, MD  diclofenac sodium (VOLTAREN) 1 % GEL Apply 1 application topically 3 (three) times daily. To right knee 01/18/14  Yes Meredith Staggers, MD  furosemide (LASIX) 20 MG tablet Take 20 mg by mouth every morning.    Yes Historical Provider, MD  gabapentin (NEURONTIN) 600 MG tablet Take 1 tablet (600 mg total) by mouth as directed. Take 2 tablet in the morning, 2 tablet in the middle of the day, and 3 tablets at night 08/01/13  Yes Meredith Staggers, MD  levothyroxine (SYNTHROID, LEVOTHROID) 50 MCG tablet Take 1 tablet (50 mcg total) by mouth daily. 07/23/12  Yes Ivan Anchors Love, PA-C  linagliptin (TRADJENTA) 5 MG TABS tablet Take 5 mg by mouth every morning.    Yes Historical Provider, MD  metoprolol succinate (TOPROL-XL) 100 MG 24 hr tablet Take 100 mg by mouth every morning. Take with or immediately following a meal.   Yes Historical Provider, MD  morphine (MSIR) 15 MG tablet Take 1 tablet (15 mg total) by mouth every 6 (six) hours as needed for moderate pain. 09/20/14  Yes Meredith Staggers, MD  Multiple Vitamin (MULTIVITAMIN WITH MINERALS) TABS Take 1 tablet by mouth daily with lunch.    Yes Historical Provider, MD  mycophenolate (CELLCEPT) 500 MG tablet Take 500 mg by mouth 2 (two) times daily.    Yes Historical Provider, MD  OXYGEN-HELIUM IN Inhale 2 L into the lungs at bedtime. And on exertion   Yes Historical Provider, MD  pantoprazole (PROTONIX) 40 MG tablet Take 40 mg by mouth daily before breakfast.    Yes Historical Provider, MD  predniSONE (DELTASONE) 10  MG tablet Take 10 mg by mouth daily with breakfast.  05/04/13  Yes Tanda Rockers, MD  Spacer/Aero-Holding Chambers (AEROCHAMBER MV) inhaler Use as instructed 06/10/13  Yes Tammy S Parrett, NP  traMADol (ULTRAM-ER) 100 MG 24 hr tablet Take 1 tablet (100 mg total) by mouth 2 times daily at 12 noon and 4 pm. 09/20/14  Yes Meredith Staggers, MD  DULoxetine (CYMBALTA) 60 MG capsule Take 2 capsules (120 mg total) by mouth every evening. 11/02/14   Erlene Quan, PA-C  furosemide (LASIX) 40 MG tablet Take 1 tablet (40 mg total) by mouth 2 (two) times daily. 11/02/14   Erlene Quan, PA-C  sulfamethoxazole-trimethoprim (BACTRIM DS,SEPTRA DS) 800-160 MG per tablet Take 1 tablet by mouth 3 (three) times a week. Jory Sims, Fri 11/02/14   Erlene Quan, PA-C   Allergies  Allergen Reactions  . Remicade [Infliximab] Anaphylaxis    "sent me into shock"  . Codeine Swelling    Facial swelling  . Zestril [Lisinopril] Swelling    Face and neck swelling    FAMILY HISTORY:  family history includes Breast cancer in her sister; Diabetes  in her brother, mother, and sister; Gout in her brother; Heart attack in her brother and mother; Heart disease in her brother; Hypertension in her brother and father; Kidney cancer in her brother; Kidney failure in her brother; Lung cancer in her father; Rheum arthritis in her maternal uncle; Uterine cancer in her daughter.   SOCIAL HISTORY:  reports that she has never smoked. She has never used smokeless tobacco. She reports that she does not drink alcohol or use illicit drugs.  REVIEW OF SYSTEMS:   Constitutional: Negative for fever, chills, weight loss, malaise/fatigue and diaphoresis.  HENT: Negative for hearing loss, ear pain, nosebleeds, congestion, sore throat, neck pain, tinnitus and ear discharge.   Eyes: Negative for blurred vision, double vision, photophobia, pain, discharge and redness.  Pulmonary and cardiology : Per history of present illness    Gastrointestinal:  Negative for heartburn, nausea, vomiting, abdominal pain, diarrhea, constipation, blood in stool and melena.  Genitourinary: Negative for dysuria, urgency, frequency, hematuria and flank pain.  Musculoskeletal: Negative for myalgias, back pain, joint pain and falls.  Skin: Negative for itching and rash.  Neurological: Negative for dizziness, tingling, tremors, sensory change, speech change, focal weakness, seizures, loss of consciousness, weakness and headaches.  Endo/Heme/Allergies: Negative for environmental allergies and polydipsia. Does not bruise/bleed easily.  SUBJECTIVE:   VITAL SIGNS: Temp:  [98 F (36.7 C)-98.4 F (36.9 C)] 98 F (36.7 C) (06/16 0555) Pulse Rate:  [87-93] 93 (06/16 0806) Resp:  [18] 18 (06/16 0555) BP: (115-129)/(67-90) 128/90 mmHg (06/16 0806) SpO2:  [96 %-99 %] 96 % (06/16 0806) Weight:  [290 lb 11.2 oz (131.861 kg)] 290 lb 11.2 oz (131.861 kg) (06/16 0555)  PHYSICAL EXAMINATION: General:  Morbidly obese no distress in bed Neuro: Alert and oriented 4, moves all extremities well HEENT:  NCAT, EOMI, oropharynx clear Cardiovascular:  Regular rate and rhythm no murmurs gallops or rubs Lungs:  Inspiratory crackles primarily right base, some left base, normal effort, no wheezing, good air movement GI:  Soft, nontender, nondistended Musculoskeletal:  Normal bulk and tone Skin:  No rash or skin breakdown   Recent Labs Lab 10/30/14 1725 10/31/14 0415 11/01/14 0246  NA 139 142 138  K 3.0* 3.7 4.0  CL 97* 99* 99*  CO2 34* 34* 32  BUN 31* 31* 24*  CREATININE 1.65* 1.36* 1.24*  GLUCOSE 226* 149* 161*    Recent Labs Lab 10/31/14 0415 11/01/14 0246 11/02/14 0534  HGB 12.1 11.6* 11.4*  HCT 38.7 36.7 37.6  WBC 7.5 6.9 8.1  PLT 176 161 157   No results found.   10/30/2014 chest x-ray reviewed with chronic changes bilaterally but no marketed worsening in airspace disease April 2016 CT chest reviewed with predominantly bronchiectasis in the lower  lobe distribution as well as mild interstitial changes in the right lower lobe, comment from radiologist notes that it had worsened  ASSESSMENT / PLAN: Rheumatoid arthritis associated interstitial lung disease Diastolic heart failure Gastroesophageal reflux disease Laryngeal irritation from gastroesophageal reflux disease Morbid obesity Deconditioning  Shortness of breath: This is a very complicated problem considering her multiple comorbid illnesses. Primarily her significant weight gain of over 50 pounds in the last year as well as progressive deconditioning is the #1 cause for her worsening shortness of breath. She does have interstitial lung disease associated with bronchiectasis which based on the most recent CT scan showed some slight worsening. However, on my review of the images this is only a mild progression and she still has significantly well preserved  lung tissue. I explained to her that her shortness of breath comes from her lung disease, her obesity, her deconditioning, and her diastolic heart failure.   It will be very difficult to sort out if they worsening interstitial lung disease is contributing to her shortness of breath because of these comorbid illnesses. Increasing her immunosuppression with prednisone may give benefit to the interstitial lung disease, but this comes at the cost of worsening weight gain, muscle weakness, and fluid retention.  Her ongoing gastroesophageal reflux disease is contributing to laryngeal irritation which also contributes to her frustration from respiratory symptoms including cough and chest tightness.   Plan: Increase prednisone slightly to 20 mg and repeat pulmonary function test in our office in 2 weeks Go back to pulmonary rehabilitation after hospital discharge Continue fluid management per cardiology Start Pepcid at night in addition to daily pantoprazole to help with reflux which is contributing to laryngeal irritation  Okay for discharge  from our perspective  Greater than 80 minutes were spent with the patient in her family in direct consultation in addition to the time taken to review records and old images.  Follow-up in our clinic in 2 weeks  Roselie Awkward, MD [place] Pager: [phone] Cell: [phone] After 3pm or if no response, call [phone]   11/02/2014, 3:11 PM

## 2014-11-02 NOTE — Discharge Instructions (Signed)

## 2014-11-02 NOTE — Progress Notes (Signed)
1300 Pt and daughter expressed desire to speak with Md or associated for updates . PA [employer] back . DR.Gwenlyn Found will update the pt. Pt informed .  [place] Dr Gwenlyn Found and PA Arvilla Meres came in and spoke with pt. With orders

## 2014-11-02 NOTE — Progress Notes (Signed)
Subjective:  She says SOB is improved  Objective:  Vital Signs in the last 24 hours: Temp:  [97.2 F (36.2 C)-98.4 F (36.9 C)] 98 F (36.7 C) (06/16 0555) Pulse Rate:  [87-93] 93 (06/16 0806) Resp:  [18] 18 (06/16 0555) BP: (115-137)/(67-90) 128/90 mmHg (06/16 0806) SpO2:  [96 %-99 %] 96 % (06/16 0806) Weight:  [290 lb 11.2 oz (131.861 kg)] 290 lb 11.2 oz (131.861 kg) (06/16 0555)  Intake/Output from previous day:  Intake/Output Summary (Last 24 hours) at 11/02/14 1115 Last data filed at 11/02/14 1109  Gross per 24 hour  Intake    980 ml  Output   2700 ml  Net  -1720 ml    Physical Exam: General appearance: alert, cooperative, no distress and morbidly obese Lungs: decreased breath sounds Heart: regular rate and rhythm Extremities: Rt Radial site with ecchymosis, no hematoma   Rate: 90  Rhythm: normal sinus rhythm  Lab Results:  Recent Labs  11/01/14 0246 11/02/14 0534  WBC 6.9 8.1  HGB 11.6* 11.4*  PLT 161 157    Recent Labs  10/31/14 0415 11/01/14 0246  NA 142 138  K 3.7 4.0  CL 99* 99*  CO2 34* 32  GLUCOSE 149* 161*  BUN 31* 24*  CREATININE 1.36* 1.24*    Recent Labs  10/30/14 2245 10/31/14 0415  TROPONINI <0.03 <0.03    Recent Labs  10/30/14 1725  INR 1.14    Scheduled Meds: . allopurinol  200 mg Oral Daily  . aspirin EC  81 mg Oral Daily  . atorvastatin  10 mg Oral QPM  . calcium-vitamin D  2 tablet Oral Q24H  . cholecalciferol  2,000 Units Oral Q lunch  . DULoxetine  120 mg Oral QPM  . furosemide  40 mg Intravenous BID  . gabapentin  1,200 mg Oral Daily   And  . gabapentin  1,200 mg Oral Daily   And  . gabapentin  1,800 mg Oral QPM  . insulin aspart  0-9 Units Subcutaneous TID WC  . levothyroxine  50 mcg Oral QAC breakfast  . linagliptin  5 mg Oral q morning - 10a  . metoprolol succinate  100 mg Oral QPC breakfast  . multivitamin with minerals  1 tablet Oral Q lunch  . mycophenolate  500 mg Oral BID  . pantoprazole   40 mg Oral QAC breakfast  . predniSONE  10 mg Oral Q breakfast  . sodium chloride  3 mL Intravenous Q12H  . sulfamethoxazole-trimethoprim  1 tablet Oral Once per day on Mon Wed Fri  . traMADol  100 mg Oral q12n4p   Continuous Infusions: . sodium chloride     PRN Meds:.sodium chloride, acetaminophen, albuterol, ALPRAZolam, morphine, nitroGLYCERIN, ondansetron (ZOFRAN) IV, sodium chloride   Imaging: Imaging results have been reviewed  Cardiac Studies:  Assessment/Plan:  74 y.o. Morbidly obese AA female with a history of ILD followed by Dr Chase Caller, referred to Dr Debara Pickett for volume overload and chest pain. Pt had diagnostic cath 11/01/14 and this revealed minor CAD. She was treated with IV diuretics and improved symptomatically. Her discharge wgt is listed at 290 but accuracy is questionable (adm wgt 285).   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 OK to resume Benicar (SCr 1.65 prior to admission- on Benicar / HCTZ at home).   Kerin Ransom PA-C 11/02/2014, 11:15 AM [phone]  Agree with note written by Kerin Ransom [facility]  OK for DC.I/O neg.SCr OK.Lungs clear. Minimal periph edema . D/C on Lasix 40 mgPO BID. Restart Benicar /HCTZ. [place] [facility] . Bmet next week .  Quay Burow 11/02/2014 1:42 PM

## 2014-11-02 NOTE — Progress Notes (Signed)
1824 Discharge instructions given to pt and family. Verbalized under No apparent distressstanding . Wheeled to lobby by NT

## 2014-11-06 DIAGNOSIS — E1122 Type 2 diabetes mellitus with diabetic chronic kidney disease: Secondary | ICD-10-CM | POA: Diagnosis not present

## 2014-11-06 DIAGNOSIS — J305 Allergic rhinitis due to food: Secondary | ICD-10-CM | POA: Diagnosis not present

## 2014-11-08 ENCOUNTER — Ambulatory Visit (INDEPENDENT_AMBULATORY_CARE_PROVIDER_SITE_OTHER): Payer: Medicare Other | Admitting: Cardiology

## 2014-11-08 ENCOUNTER — Encounter: Payer: Self-pay | Admitting: Cardiology

## 2014-11-08 VITALS — BP 86/58 | HR 83 | Ht 64.0 in | Wt 291.8 lb

## 2014-11-08 DIAGNOSIS — I5032 Chronic diastolic (congestive) heart failure: Secondary | ICD-10-CM | POA: Diagnosis not present

## 2014-11-08 DIAGNOSIS — I10 Essential (primary) hypertension: Secondary | ICD-10-CM

## 2014-11-08 DIAGNOSIS — I200 Unstable angina: Secondary | ICD-10-CM | POA: Diagnosis not present

## 2014-11-08 DIAGNOSIS — R0602 Shortness of breath: Secondary | ICD-10-CM

## 2014-11-08 DIAGNOSIS — I2510 Atherosclerotic heart disease of native coronary artery without angina pectoris: Secondary | ICD-10-CM

## 2014-11-08 DIAGNOSIS — I951 Orthostatic hypotension: Secondary | ICD-10-CM

## 2014-11-08 LAB — BASIC METABOLIC PANEL
BUN: 54 mg/dL — AB (ref 6–23)
CHLORIDE: 93 meq/L — AB (ref 96–112)
CO2: 37 mEq/L — ABNORMAL HIGH (ref 19–32)
Calcium: 9.2 mg/dL (ref 8.4–10.5)
Creatinine, Ser: 1.95 mg/dL — ABNORMAL HIGH (ref 0.40–1.20)
GFR: 32.34 mL/min — ABNORMAL LOW (ref >60.00)
GLUCOSE: 115 mg/dL — AB (ref 70–99)
POTASSIUM: 3.9 meq/L (ref 3.5–5.1)
Sodium: 138 mEq/L (ref 135–145)

## 2014-11-08 MED ORDER — FUROSEMIDE 40 MG PO TABS: 40.0000 mg | ORAL_TABLET | Freq: Every day | ORAL | Status: DC

## 2014-11-08 NOTE — Patient Instructions (Addendum)
Medication Instructions:  Your physician has recommended you make the following change in your medication:  Do not take furosemide today.  Resume tomorrow at 40 mg once daily   Labwork: Lab work to be done today-BMP   Testing/Procedures: None   Follow-Up: Keep scheduled follow up appt with Dr. Gwenlyn Found on December 01, 2014  Please wear support stockings.  You have a prescription for this.  You can get these at a medical supply store

## 2014-11-08 NOTE — Progress Notes (Signed)
Cardiology Office Note   Date:  11/08/2014   ID:  Joan, [name] [birth year] Mcdaniel, MRN [id number]  PCP:  Maximino Greenland, MD  Cardiologist:  Dr. Gwenlyn Found  Chief Complaint  Patient presents with  . Hospitalization Follow-up    for CHF, no chest pain, no SOB      History of Present Illness: [name] is a 74 y.o. female who presents for follow up of hospitalization 11/02/2014-11/02/2014 for edema and intermittent chest pressure. [name] has a significant family history of cardiovascular disease in her mother and 4 brothers. Ms [name] has CKD stage 3 and interstitial lung disease requiring home oxygen, rheumatoid arthritis and a brain tumor/meningioma s/p gamma knife surgery 10/13. She has had progressive disabling dyspnea and chest pain, but no heart attack or stroke. During her hosptalization she was diuresed gently for edema. Her troponins were negative X3. On 10/30/14 BNP was 4.6. A nuclear med study on 11/05/14 showed low risk with mild inferior attenuation and no ischemia, normal LV Function,EF 75%.  A left and right heart cath was done 10/31/14 that showed minor CAD with no more than 20% stenosis of the vessels and hemodynamic finding consistent with mild pulmonary hypertension.   She states that she felt much better upon discharge than at admission. She still feels better, has had no further chest pain or pressure and her breathing is at baseline. Her edema has not changed since discharge and her weight has gone from 293 to 288 pounds per home scale. She does however, report 2 falls and dizziness. On 11/06/14 in the morning she fell while trying to get up from her bed and in the evening while getting out of her car to hold onto a shopping cart she became lightheaded and went down to the ground. She felt better once she was down and was helped up be 3 people. She denies any injuries. This morning she has not felt dizzy and she has not taken her lasix yet.  She is concerned about an  on and off headache for the last 1-2 months with no pattern. The pain is 3-4/10 and occurs in the left frontal/facial area. She thinks this may be sinus related. She has an appointment with her pulmonologist in 2 days.    Past Medical History  Diagnosis Date  . Diabetes mellitus   . Hypertension   . Thyroid disease   . Arthritis     endstage changes bilateral knees/bilateral ankles.   . High cholesterol   . 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 02/17/2012    Continue diplopia, left eye pain and left headaches.    . Depression, reactive   . RA (rheumatoid arthritis)     has been off methotreaxte since 10/13.  . Contusion of left knee     due to fall 1/14.  [name] URI (upper respiratory infection)   . Clotting disorder     pt denies this  . Asthma   . Carotid artery occlusion   . Interstitial lung disease   . Morbid obesity   . Shortness of breath   . Family history of heart disease   . Chronic kidney disease   . COPD (chronic obstructive pulmonary disease)     pulmonary fibrosis    Past Surgical History  Procedure Laterality Date  . Abdominal hysterectomy    . Ovary surgery    . Shoulder surgery Left   . Brain surgery      Gamma knife 10/13. Needs  repeat spring  '14  . Video bronchoscopy Bilateral 05/31/2013    Procedure: VIDEO BRONCHOSCOPY WITHOUT FLUORO;  Surgeon: Brand Males, MD;  Location: [facility];  Service: Cardiopulmonary;  Laterality: Bilateral;  . Video bronscoscopy  2000    lung  . Tonsillectomy  age 33  . Esophagogastroduodenoscopy (egd) with propofol N/A 09/14/2014    Procedure: ESOPHAGOGASTRODUODENOSCOPY (EGD) WITH PROPOFOL;  Surgeon: [name], MD;  Location: [facility];  Service: Endoscopy;  Laterality: N/A;  . Cardiac catheterization N/A 10/31/2014    Procedure: Right/Left Heart Cath and Coronary Angiography;  Surgeon: [name], MD;  Location: [facility];  Service: Cardiovascular;  Laterality: N/A;      Current Outpatient Prescriptions  Medication Sig Dispense Refill  . ACCU-CHEK SMARTVIEW test strip 1 each by Other route every morning.   1  . acetaminophen (TYLENOL) 500 MG tablet Take 1,000 mg by mouth 2 (two) times daily as needed for pain. Takes with tramadol    . albuterol (PROVENTIL HFA;VENTOLIN HFA) 108 (90 BASE) MCG/ACT inhaler Inhale 2 puffs into the lungs every 6 (six) hours as needed for wheezing or shortness of breath.    . allopurinol (ZYLOPRIM) 100 MG tablet Take 2 tablets (200 mg total) by mouth daily. 60 tablet 4  . aspirin EC 81 MG tablet Take 81 mg by mouth every morning.     [name] atorvastatin (LIPITOR) 20 MG tablet Take 10 mg by mouth every evening.     [name] BENICAR HCT 40-25 MG per tablet Take 1 tablet by mouth every morning.   0  . Calcium Carb-Cholecalciferol (CALCIUM + D3 PO) Take 2 tablets by mouth daily with lunch.     . cholecalciferol (VITAMIN D) 1000 UNITS tablet Take 2,000 Units by mouth daily with lunch.     . colchicine 0.6 MG tablet Take 0.6 mg by mouth daily as needed (GOUT).    [name] dextromethorphan (DELSYM) 30 MG/5ML liquid Take 30 mg by mouth daily as needed for cough.     . diclofenac sodium (VOLTAREN) 1 % GEL Apply 1 application topically 3 (three) times daily. To right knee 3 Tube 4  . DULoxetine (CYMBALTA) 60 MG capsule Take 2 capsules (120 mg total) by mouth every evening. 60 capsule 4  . famotidine (PEPCID) 20 MG tablet Take 1 tablet (20 mg total) by mouth at bedtime. 30 tablet 11  . furosemide (LASIX) 40 MG tablet Take 40 mg by mouth 2 (two) times daily.    [name] gabapentin (NEURONTIN) 600 MG tablet Take 1 tablet (600 mg total) by mouth as directed. Take 2 tablet in the morning, 2 tablet in the middle of the day, and 3 tablets at night 210 tablet 1  . levothyroxine (SYNTHROID, LEVOTHROID) 50 MCG tablet Take 1 tablet (50 mcg total) by mouth daily. 30 tablet 1  . linagliptin (TRADJENTA) 5 MG TABS tablet Take 5 mg by mouth every morning.     . metoprolol  succinate (TOPROL-XL) 100 MG 24 hr tablet Take 100 mg by mouth every morning. Take with or immediately following a meal.    . morphine (MSIR) 15 MG tablet Take 1 tablet (15 mg total) by mouth every 6 (six) hours as needed for moderate pain. 75 tablet 0  . Multiple Vitamin (MULTIVITAMIN WITH MINERALS) TABS Take 1 tablet by mouth daily with lunch.     . mycophenolate (CELLCEPT) 500 MG tablet Take 500 mg by mouth 2 (two) times daily.     Donell Sievert IN Inhale  2 L into the lungs at bedtime. And on exertion    . pantoprazole (PROTONIX) 40 MG tablet Take 40 mg by mouth daily before breakfast.     . predniSONE (DELTASONE) 10 MG tablet Take 2 tablets (20 mg total) by mouth daily with breakfast. 60 tablet 5  . Spacer/Aero-Holding Chambers (AEROCHAMBER MV) inhaler Use as instructed 1 each 0  . sulfamethoxazole-trimethoprim (BACTRIM DS,SEPTRA DS) 800-160 MG per tablet Take 1 tablet by mouth 3 (three) times a week. Mon, Wed, Fri 15 tablet 0  . traMADol (ULTRAM-ER) 100 MG 24 hr tablet Take 1 tablet (100 mg total) by mouth 2 times daily at 12 noon and 4 pm. 60 tablet 1   No current facility-administered medications for this visit.    Allergies:   Codeine; Remicade; and Zestril    Social History:  The patient  reports that she has never smoked. She has never used smokeless tobacco. She reports that she does not drink alcohol or use illicit drugs.   Family History:  The patient's family history includes Breast cancer in her sister; Diabetes in her brother, mother, and sister; Gout in her brother; Heart attack in her brother and mother; Heart disease in her brother; Hypertension in her brother and father; Kidney cancer in her brother; Kidney failure in her brother; Lung cancer in her father; Rheum arthritis in her maternal uncle; Uterine cancer in her daughter.    ROS:  General:no colds or fevers Skin:no rashes or ulcers HEENT:no blurred vision, no congestion CV:see HPI PUL:see HPI GI:no diarrhea  constipation or melena, no indigestion GU:no hematuria, no dysuria YN:WGNFAOZH joint and back pain that limits her mobility Neuro:+lightheadedness as above with fall Endo:no diabetes, + thyroid disease stable followed by PCP  Wt Readings from Last 3 Encounters:  11/08/14 291 lb 12.8 oz (132.36 kg)  11/02/14 290 lb 11.2 oz (131.861 kg)  10/30/14 285 lb 4.8 oz (129.411 kg)    Weights are all on different scales   PHYSICAL EXAM: VS:  BP 122/72 mmHg  Pulse 90  Ht _0  (1.626 m)  Wt 291 lb 12.8 oz (132.36 kg)  BMI 50.06 kg/m2 , BMI Body mass index is 50.06 kg/(m^2). General:Pleasant affect, NAD Skin:Warm and dry, brisk capillary refill HEENT:normocephalic, sclera clear, mucus membranes moist Neck:supple, no JVD, no bruits  Heart:S1S2 RRR without murmur, gallup, rub or click Lungs:clear but dimished in bases, without rales, rhonchi, or wheezes Abd: obese, soft, non tender, + BS,  Ext: 2+ lower ext edema, 1+ pedal pulses, 2+ radial pulses Neuro:alert and oriented, MAE, follows commands, + facial symmetry    EKG:  EKG is ordered today. The ekg ordered today demonstrates normal sinus rhythm with rate of 90   Recent Labs: 06/06/2014: Pro B Natriuretic peptide (BNP) 18.0 10/30/2014: ALT 28; B Natriuretic Peptide 4.6; Magnesium 1.7; TSH 3.783 11/01/2014: BUN 24*; Creatinine, Ser 1.24*; Potassium 4.0; Sodium 138 11/02/2014: Hemoglobin 11.4*; Platelets 157    Lipid Panel    Component Value Date/Time   CHOL 168 10/31/2014 0415   TRIG 87 10/31/2014 0415   HDL 49 10/31/2014 0415   CHOLHDL 3.4 10/31/2014 0415   VLDL 17 10/31/2014 0415   LDLCALC 102* 10/31/2014 0415       Other studies Reviewed: Additional studies/ records that were reviewed today include:  0/86/5784- 2D echo: Systolic function was normal. The estimated ejection fraction was in the range of 55% to 60%. Although no diagnostic regional wall motion abnormality was identified, this possibility cannot be completely  excluded. Doppler parameters are consistent with abnormal left ventricular relaxation (grade 1 diastolic Dysfunction). 10/30/13- Dobutamine stress test: Low risk stress nuclear study demonstrating mild inferior attenuation without ischemia.  NL LV Function,EF 75%; NL Wall Motion 10/31/14 Left and right heart cath:  Mild CAD without significant obstructive disease. Ectasia in all three major proximal vessels.  Mild pulmonary hypertension. Aortic saturation 87%. Pulmonary artery saturation 60%. Cardiac output 7.6 L. Cardiac index 3.3.    ASSESSMENT AND PLAN:  1.  Heart failure with grade 1 diastolic dysfunction, LVEF 55-60%. Improved now with orthostatic hypotension will decrease diuretic.   2. Edema with multifactorial components including diastolic dysfunction, chronic steroids, CKD and decreased mobility. Add compression stockings.  3. CAD with mild coronary artery disease and negative troponins.  4.  Hypotension and orthostatic. possibly related to diuresis. Her inpatient BNP was 18. Hold Lasix for today and reduce daily dosage to 40 mg once/day. Add compression stockings.  5. Dyspnea on exertion with multifactorial origins, but likely more related to pulmonary disease.   6. Headache and sinus complaints to follow up with PCP   Current medicines are reviewed with the patient today.  The patient Has no concerns regarding medicines.  The following changes have been made:  See above Labs/ tests ordered today include:see above  Disposition:   FU:  see above  Signed, [name], RN, NP student [facility] Examination and notes entered under the direct supervision and in the presence of Cecilie Kicks, FNP-C 11/08/2014 9:07 AM     I have seen the pt along with Ms. Loye Reininger, NP-Student and have examined the pt. Independently.  We discussed exam and plan.  I agree with orders and plan.  Pt was TOC but no phone call.   Cecilie Kicks, FNP-C [facility].      [place], [place] [place], [place] Phone: [phone]; Fax: [phone]

## 2014-11-09 ENCOUNTER — Telehealth: Payer: Self-pay | Admitting: *Deleted

## 2014-11-09 ENCOUNTER — Encounter: Payer: Self-pay | Admitting: Cardiology

## 2014-11-09 DIAGNOSIS — Z79899 Other long term (current) drug therapy: Secondary | ICD-10-CM

## 2014-11-09 NOTE — Telephone Encounter (Signed)
-----  Message from Isaiah Serge, NP sent at 11/08/2014 10:24 PM EDT ----- We decreased lasix today, but let's recheck BMP in 1 week on lasix 40 once a day. thanks

## 2014-11-09 NOTE — Telephone Encounter (Signed)
Patient notified of results Repeat lab slip mailed to patient

## 2014-11-10 ENCOUNTER — Ambulatory Visit (INDEPENDENT_AMBULATORY_CARE_PROVIDER_SITE_OTHER): Payer: Medicare Other | Admitting: Adult Health

## 2014-11-10 ENCOUNTER — Encounter: Payer: Self-pay | Admitting: Adult Health

## 2014-11-10 VITALS — BP 96/62 | HR 72 | Temp 97.5°F | Ht 64.0 in | Wt 292.4 lb

## 2014-11-10 DIAGNOSIS — I10 Essential (primary) hypertension: Secondary | ICD-10-CM

## 2014-11-10 DIAGNOSIS — J849 Interstitial pulmonary disease, unspecified: Secondary | ICD-10-CM | POA: Diagnosis not present

## 2014-11-10 DIAGNOSIS — I200 Unstable angina: Secondary | ICD-10-CM | POA: Diagnosis not present

## 2014-11-10 NOTE — Patient Instructions (Addendum)
Hold Benicar HCT today .  Restart tomorrow at [employer] HCT 40/75m 1/2 daily .  Refer to Pulmonary Rehab.  Continue on Prednisone 270mdaily until 11/12/14 then begin Prednisone 1058maily  Continue follow up with Rheumatology .  Continue on Cellcept, Bactrim as directed.  Saline nasal rinses As needed   Follow up Dr. RamChase Caller 4 weeks  Follow up for CT chest next month .  Please contact office for sooner follow up if symptoms do not improve or worsen or seek emergency care

## 2014-11-10 NOTE — Assessment & Plan Note (Signed)
B/p overcompensated with orthostatic symptoms   Plan  Hold Benicar HCT today .  Restart tomorrow at [employer] HCT 40/9m 1/2 daily .  follow up with cards with labs next week as planned

## 2014-11-10 NOTE — Assessment & Plan Note (Addendum)
Recent flare now resolving -check spirometry next ov  Plan  Refer to Pulmonary Rehab.  Continue on Prednisone 70m daily until 11/12/14 then begin Prednisone 141mdaily  Continue follow up with Rheumatology .  Continue on Cellcept, Bactrim as directed.  Saline nasal rinses As needed   Follow up Dr. RaChase Callern 4 weeks  Follow up for CT chest next month .

## 2014-11-10 NOTE — Progress Notes (Signed)
Subjective:    Patient ID: [name], female    DOB: 01-02-1941, 74 y.o.   MRN: [id number]  74 year old female with rheumatoid arthritis associated interstitial lung disease as well as diastolic heart failure Chronic hypoxic respiratory failure on oxygen with activity and at bedtime at 2 L.  TEST  #ILD  Pulmonary function test 04/28/2013 shows FEV1 1.7 L/74%. FEV1 1.46 L/81%. Ratio to 84/109%. Suggestive of restriction. Total lung capacity is 2.9 L/58% and consistent with restriction.  - CT Nov 2014: NSIP Pattern  - Bronch BAL early 2015: BAL shows lot of neutrophils and ddx is acut viral/bacterial infection (Tamarac had just completed abx Rx) or aspiration (please check on this). Main thing is no opportunisitc infection given her extensive immunosuppressive regimen.   Pulmonary function test 09/08/2013: Show stability. FVC 1.78 L/77%. FEV1 1.6 L/90%. Ratio of 91/118%. Still notices restriction. Total lung capacity 3.57/70% and confirms restriction. DLCO 12.5/51% and consistent with restrictive interstitial lung disease. If at all Fev1 and TLC are improved compared to Dec 2014   11/10/2014 post hospital follow-up Patient presents for a post hospital follow-up Patient was admitted June 13 of June 16 for acute on chronic diastolic congestive heart failure . She was treated with aggressive IV diuresis. She did undergo right and left heart catheter  that showed minor coronary artery disease and mild pulmonary hypertension.  At discharge, her Lasix was increased to 40 mg twice daily.  Prednisone was increased 64m for 2 weeks at discharge ILD.  No flare in cough . DOE persists , getting some better.  Remains on O2 2l/m with act.  She remains on CellCept and prophylactic Bactrim for rheumatoid arthritis. Has CT chest planned for next month  Since discharge. Patient was having some orthostatic changes with dizziness, lightheaded and falls. Her Lasix was decreased to 40 mg daily. She is  feeling some better but b/p still running on low normal 96/62.  Feels washed out. Labs showed scr tr up ~1.9 .  Legs swelling is down.   Denies chest pain, orthopnea, edema , fever or discolored mucus.    Review of Systems  Constitutional:   No  weight loss, night sweats,  Fevers, chills,  +fatigue, or  lassitude.  HEENT:   No headaches,  Difficulty swallowing,  Tooth/dental problems, or  Sore throat,                No sneezing, itching, ear ache,  +nasal congestion, post nasal drip,   CV:  No chest pain,  Orthopnea, PND, swelling in lower extremities, anasarca, dizziness, palpitations, syncope.   GI  No heartburn, indigestion, abdominal pain, nausea, vomiting, diarrhea, change in bowel habits, loss of appetite, bloody stools.   Resp: .  No excess mucus, no productive cough,  No non-productive cough,  No coughing up of blood.  No change in color of mucus.  No wheezing.  No chest wall deformity  Skin: no rash or lesions.  GU: no dysuria, change in color of urine, no urgency or frequency.  No flank pain, no hematuria   MS:  No joint pain or swelling.  No decreased range of motion.  + back pain.  Psych:  No change in mood or affect. No depression or anxiety.  No memory loss.          Objective:   Physical Exam Morbidly obese female in no acute distress, walker  Nose without purulence or discharge noted Neck without lymphadenopathy or thyromegaly Chest with crackles in the  bases bilaterally, no wheezing noted Cardiac exam with distant heart sounds, regular, and a 2/6 systolic murmur Lower extremities with 1-2+ pitting edema, no cyanosis Alert and oriented, moves all 4 extremities.       Assessment & Plan:

## 2014-11-13 DIAGNOSIS — K210 Gastro-esophageal reflux disease with esophagitis: Secondary | ICD-10-CM | POA: Diagnosis not present

## 2014-11-13 DIAGNOSIS — R499 Unspecified voice and resonance disorder: Secondary | ICD-10-CM | POA: Diagnosis not present

## 2014-11-13 DIAGNOSIS — R490 Dysphonia: Secondary | ICD-10-CM | POA: Diagnosis not present

## 2014-11-15 ENCOUNTER — Encounter: Payer: Self-pay | Admitting: Physical Medicine & Rehabilitation

## 2014-11-15 ENCOUNTER — Encounter: Payer: Medicare Other | Attending: Physical Medicine & Rehabilitation | Admitting: Physical Medicine & Rehabilitation

## 2014-11-15 VITALS — BP 142/82 | HR 86 | Resp 14

## 2014-11-15 DIAGNOSIS — I200 Unstable angina: Secondary | ICD-10-CM | POA: Diagnosis not present

## 2014-11-15 DIAGNOSIS — G894 Chronic pain syndrome: Secondary | ICD-10-CM | POA: Insufficient documentation

## 2014-11-15 DIAGNOSIS — M069 Rheumatoid arthritis, unspecified: Secondary | ICD-10-CM | POA: Diagnosis not present

## 2014-11-15 DIAGNOSIS — R5382 Chronic fatigue, unspecified: Secondary | ICD-10-CM | POA: Diagnosis not present

## 2014-11-15 DIAGNOSIS — M4806 Spinal stenosis, lumbar region: Secondary | ICD-10-CM | POA: Diagnosis not present

## 2014-11-15 DIAGNOSIS — Z79899 Other long term (current) drug therapy: Secondary | ICD-10-CM | POA: Insufficient documentation

## 2014-11-15 DIAGNOSIS — M7062 Trochanteric bursitis, left hip: Secondary | ICD-10-CM | POA: Diagnosis not present

## 2014-11-15 DIAGNOSIS — E079 Disorder of thyroid, unspecified: Secondary | ICD-10-CM | POA: Diagnosis not present

## 2014-11-15 DIAGNOSIS — M5416 Radiculopathy, lumbar region: Secondary | ICD-10-CM | POA: Diagnosis not present

## 2014-11-15 DIAGNOSIS — M170 Bilateral primary osteoarthritis of knee: Secondary | ICD-10-CM | POA: Diagnosis not present

## 2014-11-15 DIAGNOSIS — Z5181 Encounter for therapeutic drug level monitoring: Secondary | ICD-10-CM | POA: Diagnosis not present

## 2014-11-15 MED ORDER — DULOXETINE HCL 60 MG PO CPEP: 60.0000 mg | ORAL_CAPSULE | Freq: Every day | ORAL | Status: DC

## 2014-11-15 MED ORDER — TRAMADOL HCL ER 100 MG PO TB24: 100.0000 mg | ORAL_TABLET | Freq: Two times a day (BID) | ORAL | Status: DC

## 2014-11-15 NOTE — Patient Instructions (Signed)
CONTINUE TO WORK ON INCREASING YOUR WALKING AND PHYSICAL ACTIVITY.

## 2014-11-15 NOTE — Progress Notes (Signed)
Subjective:    Patient ID: [name], female    DOB: [birth year], 74 y.o.   MRN: [id number]  HPI  [name] is here in follow up of her chronic pain. She was admitted earlier this month with fluid overload. She is followed by cardiology for volume mgt. She's had some struggles with orthostatic hypotension and her benicar/hctz and lasix were cut in half. Her BP's have recovered since the adjustments. She's wearing TED stockings.   Her pain levels have been better since losing some of the volume/weight which is a good thing for her. She just started trying to walk a bit in the house since she's felt better. She looks to expand on the distances she's ambulating.   Her mood has been fairly stable over the last couple months. Her family has been very supportive and protective of her over the last month since she's been home.     Pain Inventory Average Pain 4 Pain Right Now 3 My pain is sharp, dull and aching  In the last 24 hours, has pain interfered with the following? General activity 7 Relation with others 7 Enjoyment of life 7 What TIME of day is your pain at its worst? morning Sleep (in general) Fair  Pain is worse with: standing and some activites Pain improves with: medication Relief from Meds: 8  Mobility walk with assistance use a walker how many minutes can you walk? 10 ability to climb steps?  no do you drive?  no  Function retired I need assistance with the following:  meal prep, household duties and shopping  Neuro/Psych bladder control problems weakness numbness trouble walking dizziness anxiety  Prior Studies Any changes since last visit?  yes x-rays CT/MRI  Physicians involved in your care Any changes since last visit?  yes   Family History  Problem Relation Age of Onset  . Diabetes Mother   . Heart attack Mother   . Hypertension Father   . Lung cancer Father   . Diabetes Sister   . Diabetes Brother   . Hypertension Brother   .  Kidney cancer Brother   . Uterine cancer Daughter   . Breast cancer Sister   . Rheum arthritis Maternal Uncle   . Gout Brother   . Kidney failure Brother     x 5  . Heart disease Brother   . Heart attack Brother    History   Social History  . Marital Status: Married    Spouse Name: N/A  . Number of Children: 6  . Years of Education: college   Occupational History  . retired Marine scientist    Social History Main Topics  . Smoking status: Never Smoker   . Smokeless tobacco: Never Used  . Alcohol Use: No  . Drug Use: No  . Sexual Activity: Not Currently   Other Topics Concern  . None   Social History Narrative   Patient consumes 2-3 cups coffee per day.   Past Surgical History  Procedure Laterality Date  . Abdominal hysterectomy    . Ovary surgery    . Shoulder surgery Left   . Brain surgery      Gamma knife 10/13. Needs repeat spring  '14  . Video bronchoscopy Bilateral 05/31/2013    Procedure: VIDEO BRONCHOSCOPY WITHOUT FLUORO;  Surgeon: Brand Males, MD;  Location: [facility];  Service: Cardiopulmonary;  Laterality: Bilateral;  . Video bronscoscopy  2000    lung  . Tonsillectomy  age 28  . Esophagogastroduodenoscopy (egd) with  propofol N/A 09/14/2014    Procedure: ESOPHAGOGASTRODUODENOSCOPY (EGD) WITH PROPOFOL;  Surgeon: [name], MD;  Location: [facility];  Service: Endoscopy;  Laterality: N/A;  . Cardiac catheterization N/A 10/31/2014    Procedure: Right/Left Heart Cath and Coronary Angiography;  Surgeon: [name], MD;  Location: [facility];  Service: Cardiovascular;  Laterality: N/A;   Past Medical History  Diagnosis Date  . Diabetes mellitus   . Hypertension   . Thyroid disease   . Arthritis     endstage changes bilateral knees/bilateral ankles.   . High cholesterol   . 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 02/17/2012    Continue diplopia, left eye pain and left headaches.    . Depression, reactive   . RA  (rheumatoid arthritis)     has been off methotreaxte since 10/13.  . Contusion of left knee     due to fall 1/14.  [name] URI (upper respiratory infection)   . Clotting disorder     pt denies this  . Asthma   . Carotid artery occlusion   . Interstitial lung disease   . Morbid obesity   . Shortness of breath   . Family history of heart disease   . Chronic kidney disease   . COPD (chronic obstructive pulmonary disease)     pulmonary fibrosis   BP 142/82 mmHg  Pulse 86  Resp 14  SpO2 97%  Opioid Risk Score:   Fall Risk Score:  `1  Depression screen PHQ 2/9  Depression screen [facility] 2/9 09/20/2014 08/10/2013  Decreased Interest 3 -  Down, Depressed, Hopeless 1 1  PHQ - 2 Score 4 1  Altered sleeping 0 -  Tired, decreased energy 3 -  Change in appetite 3 -  Feeling bad or failure about yourself  3 -  Trouble concentrating 1 -  Moving slowly or fidgety/restless 2 -  Suicidal thoughts 0 -  PHQ-9 Score 16 -     Review of Systems  Constitutional:       Weight gain  Respiratory: Positive for cough, shortness of breath and wheezing.   Cardiovascular: Positive for leg swelling.  Endocrine:       High blood sugar   Musculoskeletal: Positive for arthralgias and gait problem.  Neurological: Positive for dizziness, weakness and numbness.  Psychiatric/Behavioral: Positive for dysphoric mood. The patient is nervous/anxious.   All other systems reviewed and are negative.      Objective:   Physical Exam  General: Alert and oriented x 3, No apparent distress. obese  HEENT: Head is normocephalic, atraumatic, PERRLA, EOMI, sclera anicteric, oral mucosa pink and moist, dentition intact, ext ear canals clear,  Neck: Supple without JVD or lymphadenopathy  Heart: Reg rate and rhythm. No murmurs rubs or gallops  Chest: sob with gait, sometimes at rest. Phonates well  Abdomen: Soft, non-tender, non-distended, bowel sounds positive.  Extremities: No clubbing, cyanosis, 2+ edema. Pulses are 2+   Skin: Clean and intact without signs of breakdown  Neuro: Pt is cognitively appropriate with normal insight, memory, and awareness. Cranial nerves 2-12 are intact. Sensory exam is near normal in the legs... Reflexes are 2+ in all 4's. Fine motor coordination is intact. No tremors. Motor function is grossly 4 to 5/5 with pain inhbition. Musculoskeletal: low back and left hip less tender. She is slightly elevated in the left pelvis. She is antalgic with gait on the left. Crepitus in both knees with more pain in left knee. Edema 1++ LLE. [name]  Gait is generally antalgic. Fingers lack flexion especially at DIP's. No catches or nodules palpated along volar aspects of fingers.  Psych: Pt's affect is appropriate. Pt is cooperative   Assessment & Plan:   Assessment:  1. Lumbar spinal stenosis with neurogenic claudication. Associated facet arthropathy  2. Depression.  3. Diabetes mellitus type 2 with polyneuropathy  4. Rheumatoid arthritis and osteoarthritis. Multiple joint involvement including knees.  5. Left greater troch bursitis  6. Interstitial lung disease  7. Multifactorial fatigue---Likely related to her volume/cbg's  Plan:  1. Volume management per cardiology 2. . Voltaren gel for fingers/knees.  3. MSO4 for breakthrough pain, one q8 prn #75---encouraged her to use if needed.  -tramadol 150m ER bid with 1RF 4 . Discussed leisure activities, stress relief as well.---increase physical activity 5. Continue cymbalta but decrease to 677mdaily. Consider decreasing gabapentin slighlty also. 6. Follow up with me in 2 months. 30 minutes of face to face patient care time were spent during this visit. All questions were encouraged and answered.

## 2014-11-16 DIAGNOSIS — R300 Dysuria: Secondary | ICD-10-CM | POA: Diagnosis not present

## 2014-11-16 DIAGNOSIS — Z79899 Other long term (current) drug therapy: Secondary | ICD-10-CM | POA: Diagnosis not present

## 2014-11-17 LAB — BASIC METABOLIC PANEL
BUN: 27 mg/dL — ABNORMAL HIGH (ref 6–23)
CO2: 31 mEq/L (ref 19–32)
Calcium: 8.6 mg/dL (ref 8.4–10.5)
Chloride: 100 mEq/L (ref 96–112)
Creat: 1.39 mg/dL — ABNORMAL HIGH (ref 0.50–1.10)
Glucose, Bld: 80 mg/dL (ref 70–99)
Potassium: 3.9 mEq/L (ref 3.5–5.3)
Sodium: 143 mEq/L (ref 135–145)

## 2014-11-22 DIAGNOSIS — M170 Bilateral primary osteoarthritis of knee: Secondary | ICD-10-CM | POA: Diagnosis not present

## 2014-11-29 ENCOUNTER — Ambulatory Visit (INDEPENDENT_AMBULATORY_CARE_PROVIDER_SITE_OTHER)
Admission: RE | Admit: 2014-11-29 | Discharge: 2014-11-29 | Disposition: A | Discharge status: Home / Self Care | Payer: Medicare Other | Source: Ambulatory Visit | Attending: Internal Medicine | Admitting: Internal Medicine

## 2014-11-29 DIAGNOSIS — R0600 Dyspnea, unspecified: Secondary | ICD-10-CM

## 2014-11-29 DIAGNOSIS — M170 Bilateral primary osteoarthritis of knee: Secondary | ICD-10-CM | POA: Diagnosis not present

## 2014-11-30 DIAGNOSIS — L602 Onychogryphosis: Secondary | ICD-10-CM | POA: Diagnosis not present

## 2014-11-30 DIAGNOSIS — L84 Corns and callosities: Secondary | ICD-10-CM | POA: Diagnosis not present

## 2014-11-30 DIAGNOSIS — E1151 Type 2 diabetes mellitus with diabetic peripheral angiopathy without gangrene: Secondary | ICD-10-CM | POA: Diagnosis not present

## 2014-12-01 ENCOUNTER — Ambulatory Visit: Payer: Medicare Other | Admitting: Cardiovascular Disease

## 2014-12-02 DIAGNOSIS — N183 Chronic kidney disease, stage 3 (moderate): Secondary | ICD-10-CM | POA: Diagnosis not present

## 2014-12-02 DIAGNOSIS — I5020 Unspecified systolic (congestive) heart failure: Secondary | ICD-10-CM | POA: Diagnosis not present

## 2014-12-02 DIAGNOSIS — J8489 Other specified interstitial pulmonary diseases: Secondary | ICD-10-CM | POA: Diagnosis not present

## 2014-12-02 DIAGNOSIS — I130 Hypertensive heart and chronic kidney disease with heart failure and stage 1 through stage 4 chronic kidney disease, or unspecified chronic kidney disease: Secondary | ICD-10-CM | POA: Diagnosis not present

## 2014-12-06 DIAGNOSIS — M170 Bilateral primary osteoarthritis of knee: Secondary | ICD-10-CM | POA: Diagnosis not present

## 2014-12-12 DIAGNOSIS — R51 Headache: Secondary | ICD-10-CM | POA: Diagnosis not present

## 2014-12-12 DIAGNOSIS — Z9981 Dependence on supplemental oxygen: Secondary | ICD-10-CM | POA: Diagnosis not present

## 2014-12-13 DIAGNOSIS — M170 Bilateral primary osteoarthritis of knee: Secondary | ICD-10-CM | POA: Diagnosis not present

## 2014-12-15 ENCOUNTER — Telehealth (HOSPITAL_COMMUNITY): Payer: Self-pay

## 2014-12-22 ENCOUNTER — Encounter: Payer: Self-pay | Admitting: Cardiovascular Disease

## 2014-12-22 ENCOUNTER — Ambulatory Visit (INDEPENDENT_AMBULATORY_CARE_PROVIDER_SITE_OTHER): Payer: Medicare Other | Admitting: Cardiovascular Disease

## 2014-12-22 VITALS — BP 140/76 | HR 101 | Ht 64.0 in | Wt 297.5 lb

## 2014-12-22 DIAGNOSIS — Z0389 Encounter for observation for other suspected diseases and conditions ruled out: Secondary | ICD-10-CM | POA: Diagnosis not present

## 2014-12-22 DIAGNOSIS — R0609 Other forms of dyspnea: Secondary | ICD-10-CM

## 2014-12-22 DIAGNOSIS — I10 Essential (primary) hypertension: Secondary | ICD-10-CM

## 2014-12-22 DIAGNOSIS — IMO0001 Reserved for inherently not codable concepts without codable children: Secondary | ICD-10-CM

## 2014-12-22 DIAGNOSIS — R0600 Dyspnea, unspecified: Secondary | ICD-10-CM

## 2014-12-22 DIAGNOSIS — I200 Unstable angina: Secondary | ICD-10-CM | POA: Diagnosis not present

## 2014-12-22 DIAGNOSIS — E785 Hyperlipidemia, unspecified: Secondary | ICD-10-CM | POA: Diagnosis not present

## 2014-12-22 NOTE — Progress Notes (Signed)
12/22/2014 [name]   1940/05/24  [id number]  Primary Physician Maximino Greenland, MD Primary Cardiologist: Lorretta Harp MD Renae Gloss   HPI:  [name] is a 74 year old moderately overweight married African American female mother of 6 children, grandmother of a grandchildren referred by [name] for cardiovascular evaluation because of progressive dyspnea on exertion and chest pain. I last saw her in the  office 11/22/13. Her cardiovascular risk factor are  notable for a strong family history of heart disease with her mother and 33 brothers old with an ischemic heart disease. She has never had a heart attack or stroke. She does have interstitial lung disease as well as rheumatoid arthritis. She is on home oxygen. She also has a brain tumor/meningioma 2 with gamma knife therapy  She's had progressive disabling dyspnea as well as daily chest pain. She underwent cardiac catheterization 10/31/14 by Dr. Irish Lack revealing normal coronary arteries and normal LV function. She did have mild pulmonary hypertension. 2-D echo showed normal LV function with grade 1 diastolic dysfunction and mild pulmonary hypertension. Her symptoms aren't improved with additional history relates in diabetics.   Current Outpatient Prescriptions  Medication Sig Dispense Refill  . ACCU-CHEK SMARTVIEW test strip 1 each by Other route every morning.   1  . acetaminophen (TYLENOL) 500 MG tablet Take 1,000 mg by mouth 2 (two) times daily as needed for pain. Takes with tramadol    . albuterol (PROVENTIL HFA;VENTOLIN HFA) 108 (90 BASE) MCG/ACT inhaler Inhale 2 puffs into the lungs every 6 (six) hours as needed for wheezing or shortness of breath.    . allopurinol (ZYLOPRIM) 100 MG tablet Take 2 tablets (200 mg total) by mouth daily. 60 tablet 4  . aspirin EC 81 MG tablet Take 81 mg by mouth every morning.     [name] atorvastatin (LIPITOR) 20 MG tablet Take 10 mg by mouth every evening.     [name] BENICAR HCT  40-25 MG per tablet Take 1 tablet by mouth every morning.   0  . Calcium Carb-Cholecalciferol (CALCIUM + D3 PO) Take 2 tablets by mouth daily with lunch.     . cholecalciferol (VITAMIN D) 1000 UNITS tablet Take 2,000 Units by mouth daily with lunch.     . colchicine 0.6 MG tablet Take 0.6 mg by mouth daily as needed (GOUT).    [name] diclofenac sodium (VOLTAREN) 1 % GEL Apply 1 application topically 3 (three) times daily. To right knee 3 Tube 4  . DULoxetine (CYMBALTA) 60 MG capsule Take 1 capsule (60 mg total) by mouth daily. 30 capsule 4  . famotidine (PEPCID) 20 MG tablet Take 1 tablet (20 mg total) by mouth at bedtime. 30 tablet 11  . furosemide (LASIX) 40 MG tablet Take 1 tablet (40 mg total) by mouth daily. 90 tablet 3  . gabapentin (NEURONTIN) 600 MG tablet Take 1 tablet (600 mg total) by mouth as directed. Take 2 tablet in the morning, 2 tablet in the middle of the day, and 3 tablets at night 210 tablet 1  . levothyroxine (SYNTHROID, LEVOTHROID) 50 MCG tablet Take 1 tablet (50 mcg total) by mouth daily. 30 tablet 1  . linagliptin (TRADJENTA) 5 MG TABS tablet Take 5 mg by mouth every morning.     . metoprolol succinate (TOPROL-XL) 100 MG 24 hr tablet Take 100 mg by mouth every morning. Take with or immediately following a meal.    . morphine (MSIR) 15 MG tablet Take 1 tablet (15 mg  total) by mouth every 6 (six) hours as needed for moderate pain. 75 tablet 0  . Multiple Vitamin (MULTIVITAMIN WITH MINERALS) TABS Take 1 tablet by mouth daily with lunch.     . mycophenolate (CELLCEPT) 500 MG tablet Take 500 mg by mouth 2 (two) times daily.     . OXYGEN-HELIUM IN Inhale 2 L into the lungs at bedtime. And on exertion    . pantoprazole (PROTONIX) 40 MG tablet Take 40 mg by mouth daily before breakfast.     . predniSONE (DELTASONE) 10 MG tablet Take 2 tablets (20 mg total) by mouth daily with breakfast. 60 tablet 5  . Spacer/Aero-Holding Chambers (AEROCHAMBER MV) inhaler Use as instructed 1 each 0  .  sulfamethoxazole-trimethoprim (BACTRIM DS,SEPTRA DS) 800-160 MG per tablet Take 1 tablet by mouth 3 (three) times a week. Mon, Wed, Fri 15 tablet 0  . traMADol (ULTRAM-ER) 100 MG 24 hr tablet Take 1 tablet (100 mg total) by mouth 2 times daily at 12 noon and 4 pm. 60 tablet 1   No current facility-administered medications for this visit.    Allergies  Allergen Reactions  . Codeine Swelling    Facial swelling  . Remicade [Infliximab] Anaphylaxis    "sent me into shock"  . Zestril [Lisinopril] Swelling    Face and neck swelling    History   Social History  . Marital Status: Married    Spouse Name: N/A  . Number of Children: 6  . Years of Education: college   Occupational History  . retired Marine scientist    Social History Main Topics  . Smoking status: Never Smoker   . Smokeless tobacco: Never Used  . Alcohol Use: No  . Drug Use: No  . Sexual Activity: Not Currently   Other Topics Concern  . Not on file   Social History Narrative   Patient consumes 2-3 cups coffee per day.     Review of Systems: General: negative for chills, fever, night sweats or weight changes.  Cardiovascular: negative for chest pain, dyspnea on exertion, edema, orthopnea, palpitations, paroxysmal nocturnal dyspnea or shortness of breath Dermatological: negative for rash Respiratory: negative for cough or wheezing Urologic: negative for hematuria Abdominal: negative for nausea, vomiting, diarrhea, bright red blood per rectum, melena, or hematemesis Neurologic: negative for visual changes, syncope, or dizziness All other systems reviewed and are otherwise negative except as noted above.    Blood pressure 140/76, pulse 101, height _0  (1.626 m), weight 297 lb 8 oz (134.945 kg).  General appearance: alert and no distress Neck: no adenopathy, no carotid bruit, no JVD, supple, symmetrical, trachea midline and thyroid not enlarged, symmetric, no tenderness/mass/nodules Lungs: clear to auscultation  bilaterally Heart: regular rate and rhythm, S1, S2 normal, no murmur, click, rub or gallop Extremities: extremities normal, atraumatic, no cyanosis or edema  EKG sinus tachycardia 101 without ST or T-wave changes. I personally reviewed this EKG  ASSESSMENT AND PLAN:   Normal coronary arteries History of normal coronary arteries by cardiac catheterization performed by Dr. Irish Lack 10/31/14.  Essential hypertension History of hypertension blood pressure measured at 140/76. She is on Benicar, hydrochlorothiazide, and metoprolol. Continue current meds at current dosing.  Dyslipidemia History of hyperlipidemia with her most recent lipid profile performed 10/31/14 revealing a total cholesterol 168, LDL 102 and HDL 49. She is on atorvastatin 20 mg a day.  DOE (dyspnea on exertion) History of dyspnea on exertion with known interstitial lung disease. Recent carotid catheterization demonstrated normal renal arteries. She has  normal lethargy with function by 2-D echo with mild pulmonary hypertension and grade 1 diastolic dysfunction. Her symptoms somewhat improved with increasing oral steroid-dependent diabetics. She has appointment to see [name] back in 2 weeks.      Lorretta Harp MD FACP,FACC,FAHA, [facility] 12/22/2014 10:04 AM

## 2014-12-22 NOTE — Assessment & Plan Note (Signed)
History of hyperlipidemia with her most recent lipid profile performed 10/31/14 revealing a total cholesterol 168, LDL 102 and HDL 49. She is on atorvastatin 20 mg a day.

## 2014-12-22 NOTE — Patient Instructions (Signed)
We request that you follow-up in: 6 months with an extender and in 1 year with Dr Andria Rhein will receive a reminder letter in the mail two months in advance. If you don't receive a letter, please call our office to schedule the follow-up appointment.

## 2014-12-22 NOTE — Assessment & Plan Note (Signed)
History of hypertension blood pressure measured at 140/76. She is on Benicar, hydrochlorothiazide, and metoprolol. Continue current meds at current dosing.

## 2014-12-22 NOTE — Assessment & Plan Note (Signed)
History of dyspnea on exertion with known interstitial lung disease. Recent carotid catheterization demonstrated normal renal arteries. She has normal lethargy with function by 2-D echo with mild pulmonary hypertension and grade 1 diastolic dysfunction. Her symptoms somewhat improved with increasing oral steroid-dependent diabetics. She has appointment to see [name] back in 2 weeks.

## 2014-12-22 NOTE — Assessment & Plan Note (Signed)
History of normal coronary arteries by cardiac catheterization performed by Dr. Irish Lack 10/31/14.

## 2014-12-28 DIAGNOSIS — M170 Bilateral primary osteoarthritis of knee: Secondary | ICD-10-CM | POA: Diagnosis not present

## 2014-12-28 DIAGNOSIS — M25561 Pain in right knee: Secondary | ICD-10-CM | POA: Diagnosis not present

## 2015-01-03 ENCOUNTER — Ambulatory Visit (INDEPENDENT_AMBULATORY_CARE_PROVIDER_SITE_OTHER): Payer: Medicare Other | Admitting: Internal Medicine

## 2015-01-03 ENCOUNTER — Encounter: Payer: Self-pay | Admitting: Internal Medicine

## 2015-01-03 ENCOUNTER — Telehealth: Payer: Self-pay | Admitting: Internal Medicine

## 2015-01-03 VITALS — BP 122/64 | HR 103 | Ht 64.0 in | Wt 297.0 lb

## 2015-01-03 DIAGNOSIS — R0600 Dyspnea, unspecified: Secondary | ICD-10-CM | POA: Diagnosis not present

## 2015-01-03 DIAGNOSIS — J849 Interstitial pulmonary disease, unspecified: Secondary | ICD-10-CM | POA: Diagnosis not present

## 2015-01-03 DIAGNOSIS — Z79899 Other long term (current) drug therapy: Secondary | ICD-10-CM

## 2015-01-03 DIAGNOSIS — I200 Unstable angina: Secondary | ICD-10-CM | POA: Diagnosis not present

## 2015-01-03 DIAGNOSIS — R0689 Other abnormalities of breathing: Secondary | ICD-10-CM | POA: Diagnosis not present

## 2015-01-03 DIAGNOSIS — R0602 Shortness of breath: Secondary | ICD-10-CM | POA: Insufficient documentation

## 2015-01-03 NOTE — Telephone Encounter (Signed)
Let Saia E Bruyere know to sto bactrim cold [place] along with cellcept

## 2015-01-03 NOTE — Patient Instructions (Addendum)
ICD-9-CM ICD-10-CM   1. ILD (interstitial lung disease) 515 J84.9   2. High risk medication use V58.69 Z79.899   3. Dyspnea and respiratory abnormality 786.09 R06.00     R06.89     Stop cellcept  Cut down prednisone from 410m per day to 7.576mper day  - stay on this dose till you see usKorear DEveshwar next  Followup  1 month to see how you are doing - can see Tammy  - if doing stable will cut prednisone down to 10m50mer day -- ultimate goal to stop it  ...... Update: will call her and let her know to stop bactrim

## 2015-01-03 NOTE — Progress Notes (Signed)
Subjective:    Patient ID: [name], female    DOB: 1941/05/08, 74 y.o.   MRN: [id number]  HPI    OV 01/03/2015  Chief Complaint  Patient presents with  . Follow-up    Pt c/o DOE, resolving cough that started over the weekend, chest discomfort when SOB and when lying down. Pt c/o increasing fatigue.    She has severe class 3 dyspnea with hypoxemia on account of BMI 50, diaast dsyfn, chronic pain, deconditioning and ILD due to RA on celcept/pred/bactrim   I personally last saw her in march 2-016. Since then has seen other provicers in this office and most recently in June 2016. In June 2016 admitted for acute CHF - cath showed mild pulm htn only with PA mean 24 and minimal CAD (reviewed chart of procedures June 2016). Dx as acute diast chf. Since then better. SHe had fu HRCT 11/29/14 that shows progression in ILD since march 2016 and now with UIP pattern (personally visualized image). She is finally accepting of fact that ILD is not makin cause of dyspnea and obesity is main problem. She is wondering about dc cellcept/pred/bactrim so as to minimize polypharmacy and side effect profile     Current outpatient prescriptions:  .  ACCU-CHEK SMARTVIEW test strip, 1 each by Other route every morning. , Disp: , Rfl: 1 .  acetaminophen (TYLENOL) 500 MG tablet, Take 1,000 mg by mouth 2 (two) times daily as needed for pain. Takes with tramadol, Disp: , Rfl:  .  albuterol (PROVENTIL HFA;VENTOLIN HFA) 108 (90 BASE) MCG/ACT inhaler, Inhale 2 puffs into the lungs every 6 (six) hours as needed for wheezing or shortness of breath., Disp: , Rfl:  .  allopurinol (ZYLOPRIM) 100 MG tablet, Take 2 tablets (200 mg total) by mouth daily., Disp: 60 tablet, Rfl: 4 .  aspirin EC 81 MG tablet, Take 81 mg by mouth every morning. , Disp: , Rfl:  .  atorvastatin (LIPITOR) 20 MG tablet, Take 10 mg by mouth every evening. , Disp: , Rfl:  .  BENICAR HCT 40-25 MG per tablet, Take 0.5 tablets by mouth every  morning. , Disp: , Rfl: 0 .  Calcium Carb-Cholecalciferol (CALCIUM + D3 PO), Take 2 tablets by mouth daily with lunch. , Disp: , Rfl:  .  cholecalciferol (VITAMIN D) 1000 UNITS tablet, Take 2,000 Units by mouth daily with lunch. , Disp: , Rfl:  .  colchicine 0.6 MG tablet, Take 0.6 mg by mouth daily as needed (GOUT)., Disp: , Rfl:  .  diclofenac sodium (VOLTAREN) 1 % GEL, Apply 1 application topically 3 (three) times daily. To right knee, Disp: 3 Tube, Rfl: 4 .  DULoxetine (CYMBALTA) 60 MG capsule, Take 1 capsule (60 mg total) by mouth daily., Disp: 30 capsule, Rfl: 4 .  famotidine (PEPCID) 20 MG tablet, Take 1 tablet (20 mg total) by mouth at bedtime., Disp: 30 tablet, Rfl: 11 .  furosemide (LASIX) 40 MG tablet, Take 1 tablet (40 mg total) by mouth daily., Disp: 90 tablet, Rfl: 3 .  gabapentin (NEURONTIN) 600 MG tablet, Take 1 tablet (600 mg total) by mouth as directed. Take 2 tablet in the morning, 2 tablet in the middle of the day, and 3 tablets at night, Disp: 210 tablet, Rfl: 1 .  levothyroxine (SYNTHROID, LEVOTHROID) 50 MCG tablet, Take 1 tablet (50 mcg total) by mouth daily., Disp: 30 tablet, Rfl: 1 .  linagliptin (TRADJENTA) 5 MG TABS tablet, Take 5 mg by mouth every morning. ,  Disp: , Rfl:  .  metoprolol succinate (TOPROL-XL) 100 MG 24 hr tablet, Take 100 mg by mouth every morning. Take with or immediately following a meal., Disp: , Rfl:  .  morphine (MSIR) 15 MG tablet, Take 1 tablet (15 mg total) by mouth every 6 (six) hours as needed for moderate pain., Disp: 75 tablet, Rfl: 0 .  Multiple Vitamin (MULTIVITAMIN WITH MINERALS) TABS, Take 1 tablet by mouth daily with lunch. , Disp: , Rfl:  .  mycophenolate (CELLCEPT) 500 MG tablet, Take 500 mg by mouth 2 (two) times daily. , Disp: , Rfl:  .  OXYGEN-HELIUM IN, Inhale 2 L into the lungs at bedtime. And on exertion, Disp: , Rfl:  .  pantoprazole (PROTONIX) 40 MG tablet, Take 40 mg by mouth daily before breakfast. , Disp: , Rfl:  .  predniSONE  (DELTASONE) 10 MG tablet, Take 2 tablets (20 mg total) by mouth daily with breakfast. (Patient taking differently: Take 10 mg by mouth daily with breakfast. ), Disp: 60 tablet, Rfl: 5 .  Spacer/Aero-Holding Chambers (AEROCHAMBER MV) inhaler, Use as instructed, Disp: 1 each, Rfl: 0 .  sulfamethoxazole-trimethoprim (BACTRIM DS,SEPTRA DS) 800-160 MG per tablet, Take 1 tablet by mouth 3 (three) times a week. Mon, Wed, Fri, Disp: 15 tablet, Rfl: 0 .  traMADol (ULTRAM-ER) 100 MG 24 hr tablet, Take 1 tablet (100 mg total) by mouth 2 times daily at 12 noon and 4 pm., Disp: 60 tablet, Rfl: 1   Review of Systems  Constitutional: Negative for fever and unexpected weight change.  HENT: Negative for congestion, dental problem, ear pain, nosebleeds, postnasal drip, rhinorrhea, sinus pressure, sneezing, sore throat and trouble swallowing.   Eyes: Negative for redness and itching.  Respiratory: Positive for cough, chest tightness and shortness of breath. Negative for wheezing.   Cardiovascular: Positive for leg swelling. Negative for palpitations.  Gastrointestinal: Negative for nausea and vomiting.  Genitourinary: Negative for dysuria.  Musculoskeletal: Positive for arthralgias. Negative for joint swelling.  Skin: Negative for rash.  Neurological: Negative for headaches.  Hematological: Does not bruise/bleed easily.  Psychiatric/Behavioral: Negative for dysphoric mood. The patient is not nervous/anxious.        Objective:   Physical Exam  Constitutional: She is oriented to person, place, and time. She appears well-developed and well-nourished. No distress.  Body mass index is 50.95 kg/(m^2). Morbidly obese Looks more conditioned ironically  HENT:  Head: Normocephalic and atraumatic.  Right Ear: External ear normal.  Left Ear: External ear normal.  Mouth/Throat: Oropharynx is clear and moist. No oropharyngeal exudate.  Eyes: Conjunctivae and EOM are normal. Pupils are equal, round, and reactive to  light. Right eye exhibits no discharge. Left eye exhibits no discharge. No scleral icterus.  Neck: Normal range of motion. Neck supple. No JVD present. No tracheal deviation present. No thyromegaly present.  Cardiovascular: Normal rate, regular rhythm, normal heart sounds and intact distal pulses.  Exam reveals no Mcdaniel and no friction rub.   No murmur heard. Pulmonary/Chest: Effort normal. No respiratory distress. She has no wheezes. She has rales. She exhibits no tenderness.  Abdominal: Soft. Bowel sounds are normal. She exhibits no distension and no mass. There is no tenderness. There is no rebound and no guarding.  Musculoskeletal: Normal range of motion. She exhibits edema. She exhibits no tenderness.  Has walker Chronic 3+ edema  Lymphadenopathy:    She has no cervical adenopathy.  Neurological: She is alert and oriented to person, place, and time. She has normal reflexes. No  cranial nerve deficit. She exhibits normal muscle tone. Coordination normal.  Skin: Skin is warm and dry. No rash noted. She is not diaphoretic. No erythema. No pallor.  Psychiatric: She has a normal mood and affect. Her behavior is normal. Judgment and thought content normal.  Vitals reviewed.   Filed Vitals:   01/03/15 1633  BP: 122/64  Pulse: 103  Height: _0  (1.626 m)  Weight: 297 lb (134.718 kg)  SpO2: 93%         Assessment & Plan:     ICD-9-CM ICD-10-CM   1. ILD (interstitial lung disease) 515 J84.9   2. High risk medication use V58.69 Z79.899   3. Dyspnea and respiratory abnormality 786.09 R06.00     R06.89    Deteriorated  Explained progression of ILD despite cellcept/pred/bactrim. We never know if she would have progressed more without cellcept. Nevertheless explained risk of cellcept/pred/bactrim. She feels best to minimize polypharmacy and try to lose weight would be best next step so as to improve immediate quality of life. HEr appt with rheum is months away. So, I will start process  of weaning her off   Stop cellcept  Cut down prednisone from 656m per day to 7.592mper day  - stay on this dose till you see usKorear DEveshwar next  Followup  1 month to see how you are doing - can see Tammy  - if doing stable will cut prednisone down to 56m101mer day -- ultimate goal to stop it  ...... Update: will call her and let her know to stop bactrim    Dr. MurBrand Males.D., F.CEastside Endoscopy Center LLCP Pulmonary and Critical Care Medicine Staff Physician [facility] Pager: [phone] no answer or between  15:00h - 7:00h: call [phone]  01/03/2015 9:12 PM

## 2015-01-04 NOTE — Telephone Encounter (Signed)
LMTCB x 1 

## 2015-01-05 ENCOUNTER — Encounter (HOSPITAL_COMMUNITY)
Admission: RE | Admit: 2015-01-05 | Discharge: 2015-01-05 | Disposition: A | Discharge status: Home / Self Care | Payer: Medicare Other | Source: Ambulatory Visit | Attending: Internal Medicine | Admitting: Internal Medicine

## 2015-01-05 ENCOUNTER — Encounter (HOSPITAL_COMMUNITY): Payer: Self-pay

## 2015-01-05 VITALS — BP 138/76 | HR 104 | Resp 20 | Ht 63.0 in | Wt 298.3 lb

## 2015-01-05 DIAGNOSIS — J849 Interstitial pulmonary disease, unspecified: Secondary | ICD-10-CM | POA: Diagnosis not present

## 2015-01-05 NOTE — Telephone Encounter (Signed)
Called and spoke to pt. Informed her of the recs per MR. Pt stated she has already stopped both medications. Nothing further needed at this time.

## 2015-01-05 NOTE — Progress Notes (Signed)
[name] 74 y.o. female Pulmonary Rehab Orientation Note [phone] Patient arrived today in Cardiac and Pulmonary Rehab for orientation to Pulmonary Rehab. She was transported from [employer] via wheel chair. She does carry portable oxygen for exertion, but decided to leave her O2 in the car since she was being transported via wheelchair.. Per pt, she uses oxygen intermittently. Color good, skin warm and dry. Patient is oriented to time and place. Patient's medical history and medications reviewed. Heart rate is tachycardic, S1S2 present. Breath sounds clear to auscultation, no wheezes, rales, or rhonchi, diminished throughout related to body habitus. Grip strength equal, strong. Distal pulses faint. Mild pitting edema noted to feet and ankles. Patient reports she does take medications as prescribed. Patient states she tries to follows a Diabetic diet. The patient reports no specific efforts to gain or lose weight. She has gained a significant amount of weight over the past year related to her immobility and steroid use. Patient states she walks very little and utilizes her wheelchair more than she ever has. She also utilizes a walker most of the time when ambulating in her home. She presents walking with her cane today, only able to take a minimal amount of steps without pain in her knees. Her physician has referred her to the bariatric weight loss clinic, but the patient is not interested in surgery. She is hoping to find a physician supported weight loss diet that she can tolerate. Patient's weight will be monitored closely. Demonstration and practice of PLB using pulse oximeter. Patient able to return demonstration satisfactorily. Safety and hand hygiene in the exercise area reviewed with patient. Patient voices understanding of the information reviewed. Department expectations discussed with patient and achievable goals were set. The patient shows enthusiasm about attending the program and we  look forward to working with this nice lady. The patient is scheduled for a 6 min walk test on Thursday 8/25 and to begin exercise on Tuesday 8/30 in the 10:30 class.   45 minutes was spent on a variety of activities such as assessment of the patient, obtaining baseline data including height, weight, BMI, and grip strength, verifying medical history, allergies, and current medications, and teaching patient strategies for performing tasks with less respiratory effort with emphasis on pursed lip breathing.

## 2015-01-11 ENCOUNTER — Encounter (HOSPITAL_COMMUNITY)
Admission: RE | Admit: 2015-01-11 | Discharge: 2015-01-11 | Disposition: A | Discharge status: Home / Self Care | Payer: Medicare Other | Source: Ambulatory Visit | Attending: Internal Medicine | Admitting: Internal Medicine

## 2015-01-11 DIAGNOSIS — I130 Hypertensive heart and chronic kidney disease with heart failure and stage 1 through stage 4 chronic kidney disease, or unspecified chronic kidney disease: Secondary | ICD-10-CM | POA: Diagnosis not present

## 2015-01-11 DIAGNOSIS — E1122 Type 2 diabetes mellitus with diabetic chronic kidney disease: Secondary | ICD-10-CM | POA: Diagnosis not present

## 2015-01-11 DIAGNOSIS — E559 Vitamin D deficiency, unspecified: Secondary | ICD-10-CM | POA: Diagnosis not present

## 2015-01-11 DIAGNOSIS — Z0000 Encounter for general adult medical examination without abnormal findings: Secondary | ICD-10-CM | POA: Diagnosis not present

## 2015-01-11 DIAGNOSIS — I5020 Unspecified systolic (congestive) heart failure: Secondary | ICD-10-CM | POA: Diagnosis not present

## 2015-01-11 DIAGNOSIS — N183 Chronic kidney disease, stage 3 (moderate): Secondary | ICD-10-CM | POA: Diagnosis not present

## 2015-01-11 DIAGNOSIS — Z1389 Encounter for screening for other disorder: Secondary | ICD-10-CM | POA: Diagnosis not present

## 2015-01-11 DIAGNOSIS — J849 Interstitial pulmonary disease, unspecified: Secondary | ICD-10-CM | POA: Diagnosis not present

## 2015-01-11 NOTE — Progress Notes (Signed)
[name] completed a Six-Minute Walk Test on 01/11/15 . Lalisa walked 268 feet with 2 seated breaks lasting 2 minutes and 40 seconds.  The patient's lowest oxygen saturation was 89 %, highest heart rate was 120 bpm , and highest blood pressure was 118/74. The patient was on 4 liters of oxygen with a nasal cannula. Patient stated that fatigue and chest tightness hindered their walk test.

## 2015-01-16 ENCOUNTER — Encounter (HOSPITAL_COMMUNITY): Payer: Medicare Other

## 2015-01-16 DIAGNOSIS — R2231 Localized swelling, mass and lump, right upper limb: Secondary | ICD-10-CM | POA: Diagnosis not present

## 2015-01-16 DIAGNOSIS — M799 Soft tissue disorder, unspecified: Secondary | ICD-10-CM | POA: Diagnosis not present

## 2015-01-17 ENCOUNTER — Encounter: Payer: Medicare Other | Attending: Physical Medicine & Rehabilitation | Admitting: Physical Medicine & Rehabilitation

## 2015-01-17 ENCOUNTER — Encounter: Payer: Self-pay | Admitting: Physical Medicine & Rehabilitation

## 2015-01-17 VITALS — BP 120/69 | HR 91 | Resp 14

## 2015-01-17 DIAGNOSIS — M7062 Trochanteric bursitis, left hip: Secondary | ICD-10-CM

## 2015-01-17 DIAGNOSIS — R5382 Chronic fatigue, unspecified: Secondary | ICD-10-CM

## 2015-01-17 DIAGNOSIS — M4806 Spinal stenosis, lumbar region: Secondary | ICD-10-CM | POA: Diagnosis not present

## 2015-01-17 DIAGNOSIS — M5416 Radiculopathy, lumbar region: Secondary | ICD-10-CM | POA: Insufficient documentation

## 2015-01-17 DIAGNOSIS — M1711 Unilateral primary osteoarthritis, right knee: Secondary | ICD-10-CM | POA: Diagnosis not present

## 2015-01-17 DIAGNOSIS — Z79899 Other long term (current) drug therapy: Secondary | ICD-10-CM | POA: Insufficient documentation

## 2015-01-17 DIAGNOSIS — M069 Rheumatoid arthritis, unspecified: Secondary | ICD-10-CM

## 2015-01-17 DIAGNOSIS — G894 Chronic pain syndrome: Secondary | ICD-10-CM | POA: Diagnosis not present

## 2015-01-17 DIAGNOSIS — I200 Unstable angina: Secondary | ICD-10-CM | POA: Diagnosis not present

## 2015-01-17 DIAGNOSIS — E079 Disorder of thyroid, unspecified: Secondary | ICD-10-CM

## 2015-01-17 DIAGNOSIS — M47816 Spondylosis without myelopathy or radiculopathy, lumbar region: Secondary | ICD-10-CM

## 2015-01-17 DIAGNOSIS — M48061 Spinal stenosis, lumbar region without neurogenic claudication: Secondary | ICD-10-CM

## 2015-01-17 DIAGNOSIS — Z5181 Encounter for therapeutic drug level monitoring: Secondary | ICD-10-CM | POA: Insufficient documentation

## 2015-01-17 MED ORDER — MORPHINE SULFATE 15 MG PO TABS: 7.5000 mg | ORAL_TABLET | Freq: Four times a day (QID) | ORAL | Status: DC | PRN

## 2015-01-17 MED ORDER — MORPHINE SULFATE ER 15 MG PO TBCR: 15.0000 mg | EXTENDED_RELEASE_TABLET | Freq: Two times a day (BID) | ORAL | Status: DC

## 2015-01-17 NOTE — Progress Notes (Signed)
Subjective:    Patient ID: [name], female    DOB: 1940-07-19, 74 y.o.   MRN: [id number]  HPI   [name] is here in follow up of her chronic pain. She reports increased swelling and pain in the right leg particularly over the last couple weeks. The pain starts in her right hip and radiates down to the right ankle. The leg feels like it's on fire. She describes sharp pains at her knee and throbbing in the hip and ankle. The leg feels hot also.   She has enrolled in pulmonary rehab but has struggled given the physical activity of the rehab, particularly the weight bearing it requires.   She has been on tramadol er and MS IR for pain control. The two have helped somewhat--she has been out of the morphine for a couple weeks which has coincided with her increased pain.     Pain Inventory Average Pain 8 Pain Right Now 8 My pain is sharp, burning, tingling and aching  In the last 24 hours, has pain interfered with the following? General activity 8 Relation with others 8 Enjoyment of life 8 What TIME of day is your pain at its worst? morning, daytime  Sleep (in general) Good  Pain is worse with: walking, bending, sitting, inactivity and standing Pain improves with: rest and medication Relief from Meds: 5  Mobility use a cane use a walker how many minutes can you walk? 1 ability to climb steps?  no do you drive?  no use a wheelchair Do you have any goals in this area?  yes  Function retired I need assistance with the following:  dressing, meal prep, household duties and shopping Do you have any goals in this area?  yes  Neuro/Psych bladder control problems numbness tingling trouble walking spasms dizziness confusion depression  Prior Studies Any changes since last visit?  no  Physicians involved in your care Any changes since last visit?  no   Family History  Problem Relation Age of Onset  . Diabetes Mother   . Heart attack Mother   .  Hypertension Father   . Lung cancer Father   . Diabetes Sister   . Diabetes Brother   . Hypertension Brother   . Kidney cancer Brother   . Uterine cancer Daughter   . Breast cancer Sister   . Rheum arthritis Maternal Uncle   . Gout Brother   . Kidney failure Brother     x 5  . Heart disease Brother   . Heart attack Brother    Social History   Social History  . Marital Status: Married    Spouse Name: N/A  . Number of Children: 6  . Years of Education: college   Occupational History  . retired Marine scientist    Social History Main Topics  . Smoking status: Never Smoker   . Smokeless tobacco: Never Used  . Alcohol Use: No  . Drug Use: No  . Sexual Activity: Not Currently   Other Topics Concern  . None   Social History Narrative   Patient consumes 2-3 cups coffee per day.   Past Surgical History  Procedure Laterality Date  . Abdominal hysterectomy    . Ovary surgery    . Shoulder surgery Left   . Brain surgery      Gamma knife 10/13. Needs repeat spring  '14  . Video bronchoscopy Bilateral 05/31/2013    Procedure: VIDEO BRONCHOSCOPY WITHOUT FLUORO;  Surgeon: Brand Males, MD;  Location:  [facility];  Service: Cardiopulmonary;  Laterality: Bilateral;  . Video bronscoscopy  2000    lung  . Tonsillectomy  age 63  . Esophagogastroduodenoscopy (egd) with propofol N/A 09/14/2014    Procedure: ESOPHAGOGASTRODUODENOSCOPY (EGD) WITH PROPOFOL;  Surgeon: [name], MD;  Location: [facility];  Service: Endoscopy;  Laterality: N/A;  . Cardiac catheterization N/A 10/31/2014    Procedure: Right/Left Heart Cath and Coronary Angiography;  Surgeon: [name], MD;  Location: [facility];  Service: Cardiovascular;  Laterality: N/A;   Past Medical History  Diagnosis Date  . Diabetes mellitus   . Hypertension   . Thyroid disease   . Arthritis     endstage changes bilateral knees/bilateral ankles.   . High cholesterol   . Meningioma of left sphenoid wing  involving cavernous sinus 02/17/2012    Continue diplopia, left eye pain and left headaches.    . Depression, reactive   . RA (rheumatoid arthritis)     has been off methotreaxte since 10/13.  . Contusion of left knee     due to fall 1/14.  [name] URI (upper respiratory infection)   . Clotting disorder     pt denies this  . Asthma   . Carotid artery occlusion   . Interstitial lung disease   . Morbid obesity   . Shortness of breath   . Family history of heart disease   . Chronic kidney disease   . COPD (chronic obstructive pulmonary disease)     pulmonary fibrosis  . Normal coronary arteries     cardiac catheterization performed  10/31/14  . Diastolic dysfunction    BP 120/69 mmHg  Pulse 91  Resp 14  SpO2 95%  Opioid Risk Score:   Fall Risk Score:  `1  Depression screen PHQ 2/9  Depression screen [facility] 2/9 01/05/2015 09/20/2014 08/10/2013  Decreased Interest 2 3 -  Down, Depressed, Hopeless _0 PHQ - 2 Score _1 Altered sleeping 2 0 -  Tired, decreased energy 2 3 -  Change in appetite 2 3 -  Feeling bad or failure about yourself  0 3 -  Trouble concentrating 1 1 -  Moving slowly or fidgety/restless 0 2 -  Suicidal thoughts 0 0 -  PHQ-9 Score 10 16 -  Difficult doing work/chores Somewhat difficult - -     Review of Systems  Constitutional: Positive for appetite change and unexpected weight change.  Respiratory: Positive for cough, shortness of breath and wheezing.   Cardiovascular: Positive for leg swelling.  Endocrine:       High blood sugar  Musculoskeletal: Positive for gait problem.  Neurological: Positive for dizziness and numbness.       Tingling Spasms   Psychiatric/Behavioral: Positive for confusion and dysphoric mood.  All other systems reviewed and are negative.      Objective:   Physical Exam   General: Alert and oriented x 3, No apparent distress. obese  HEENT: Head is normocephalic, atraumatic, PERRLA, EOMI, sclera anicteric, oral mucosa pink  and moist, dentition intact, ext ear canals clear,  Neck: Supple without JVD or lymphadenopathy  Heart: Reg rate and rhythm. No murmurs rubs or gallops  Chest: sob with gait, sometimes at rest. Phonates well  Abdomen: Soft, non-tender, non-distended, bowel sounds positive.  Extremities: No clubbing, cyanosis, 2+ edema, more so at the right ankle. Measured calf diameter and bother were the same 49.5 cm. Pulses are 2+. Legs are warm,normal temperature Skin: Clean and  intact without signs of breakdown  Neuro: Pt is cognitively appropriate with normal insight, memory, and awareness. Cranial nerves 2-12 are intact. Sensory exam is near normal in the legs... Reflexes are 2+ in all 4's. Fine motor coordination is intact. No tremors. Motor function is grossly 4 to 5/5 with pain inhbition. Musculoskeletal: low back and left hip less tender. She is slightly elevated in the left pelvis. She is antalgic with gait on the left and the right. Right side much more affected today. Right hip is tender also at the greater troch. She stands with pelvis rotated clockwiseas she tries to offload the right leg somewhat during stance. Crepitus in both knees  . Fingers lack flexion especially at DIP's. No catches or nodules palpated along volar aspects of fingers.  Psych: Pt's affect is appropriate. Pt is cooperative  Assessment & Plan:   Assessment:  1. Lumbar spinal stenosis with neurogenic claudication. Associated facet arthropathy  2. Depression.  3. Diabetes mellitus type 2 with polyneuropathy  4. Rheumatoid arthritis and osteoarthritis. Multiple joint involvement including knees.  5. Left and right greater troch bursitis  6. Interstitial lung disease  7. Multifactorial fatigue---Likely related to her volume/cbg's   Plan:  1. Volume management per cardiology  2. . Voltaren gel for fingers/knees.  3. Narcotics:.  -begin MS CR 13m q12 and stop ultram ER -will use MS IR for breakthrough pain, 175m 1/2 to 1 q12  prn, #60  -asked pt and caregiver to be especially attentive to any sedative effects of the MS CR. 4 . Discussed leisure activities, stress relief as well.---increase physical activity  5. Continue cymbalta  6069maily. Consider decreasing gabapentin slighlty also.  6. Follow up with me or NP in 1 month. 30 minutes of face to face patient care time were spent during this visit. All questions were encouraged and answered.

## 2015-01-17 NOTE — Patient Instructions (Addendum)
PLEASE CALL ME WITH ANY PROBLEMS OR QUESTIONS (#[phone]).  HAVE A GOOD DAY!   AGGRESSIVE ICE TO YOUR RIGHT KNEE

## 2015-01-18 ENCOUNTER — Encounter (HOSPITAL_COMMUNITY)
Admission: RE | Admit: 2015-01-18 | Discharge: 2015-01-18 | Disposition: A | Discharge status: Home / Self Care | Payer: Medicare Other | Source: Ambulatory Visit | Attending: Internal Medicine | Admitting: Internal Medicine

## 2015-01-18 DIAGNOSIS — J849 Interstitial pulmonary disease, unspecified: Secondary | ICD-10-CM | POA: Insufficient documentation

## 2015-01-18 NOTE — Progress Notes (Signed)
Today, [name] exercised at [facility]. [facility]. Service time was from 10:30am to 12:15pm.  The patient exercised by performing aerobic, strengthening, and stretching exercises. Oxygen saturation, heart rate, blood pressure, rate of perceived exertion, and shortness of breath were all monitored before, during, and after exercise. [name] presented with no problems at today's exercise session. The patient attended education today with Respiratory Therapy on Pursed Lip and Diaphragmatic Breathing.  The patient did not have an increase in workload intensity during today's exercise session.  Pre-exercise vitals: . Weight kg: 136.3 . Liters of O2: 2 . SpO2: 97 . HR: 95 . BP: 110/60 . CBG: 184  Exercise vitals: . Highest heartrate:  101 . Lowest oxygen saturation: 97 . Highest blood pressure: 124/84 . Liters of 02: 2  Post-exercise vitals: . SpO2: 96 . HR: 91 . BP: 108/64 . Liters of O2: 2 . CBG: 138  Dr. Brand Males, Medical Director Dr. Carles Collet is immediately available during today's Pulmonary Rehab session for [name] on 01/18/15 at 10:30am class time.

## 2015-01-18 NOTE — Progress Notes (Signed)
[name] 74 y.o. female Nutrition Note Spoke with pt. Pt well-known to this Probation officer from previous admission. Pt's primary goal for pulmonary rehab is to lose wt in order to be a candidate for TKR. [facility] weight loss program discussed with pt. Pt is diabetic. Last A1c indicates blood glucose fairly well-controlled. Pt expressed understanding of the information reviewed.  Lab Results  Component Value Date   HGBA1C 7.0* 10/30/2014    Nutrition Diagnosis ? Food-and nutrition-related knowledge deficit related to lack of exposure to information as related to diagnosis of pulmonary disease ? Obesity related to excessive energy intake as evidenced by a BMI of 52.9 Nutrition Rx/Est. Daily Nutrition Needs for: ? wt loss 1600-2100 Kcal  110-120 gm protein   1500 mg or less sodium     175-250 gm CHO Nutrition Intervention ? Pt's individual nutrition plan and goals reviewed with pt. ? Benefits of adopting healthy eating habits discussed when pt's Rate Your Plate reviewed. ? Pt to attend the Nutrition and Lung Disease class ? Handout for [employer] programs near [place] given ? Continual client-centered nutrition education by RD, as part of interdisciplinary care. Goal(s) 1. Identify food quantities necessary to achieve wt loss of  -2# per week to a goal wt of 124.4-132.6 kg (274-292 lb) at graduation from pulmonary rehab. 2. Use pre-/post meal and/or exercise CBG's and A1c to determine whether adjustments in food/meal planning will be beneficial or if any meds need to be combined with nutrition therapy. Monitor and Evaluate progress toward nutrition goal with team.   Derek Mound, M.Ed, RD, LDN, CDE 01/18/2015 12:00 PM

## 2015-01-22 ENCOUNTER — Other Ambulatory Visit: Payer: Self-pay | Admitting: Internal Medicine

## 2015-01-23 ENCOUNTER — Encounter (HOSPITAL_COMMUNITY): Payer: Medicare Other

## 2015-01-25 ENCOUNTER — Encounter (HOSPITAL_COMMUNITY): Payer: Medicare Other

## 2015-01-25 DIAGNOSIS — I130 Hypertensive heart and chronic kidney disease with heart failure and stage 1 through stage 4 chronic kidney disease, or unspecified chronic kidney disease: Secondary | ICD-10-CM | POA: Diagnosis not present

## 2015-01-25 DIAGNOSIS — E1122 Type 2 diabetes mellitus with diabetic chronic kidney disease: Secondary | ICD-10-CM | POA: Diagnosis not present

## 2015-01-25 DIAGNOSIS — N08 Glomerular disorders in diseases classified elsewhere: Secondary | ICD-10-CM | POA: Diagnosis not present

## 2015-01-25 DIAGNOSIS — N183 Chronic kidney disease, stage 3 (moderate): Secondary | ICD-10-CM | POA: Diagnosis not present

## 2015-01-27 ENCOUNTER — Emergency Department (HOSPITAL_COMMUNITY)
Admission: EM | Admit: 2015-01-27 | Discharge: 2015-01-27 | Disposition: A | Discharge status: Home / Self Care | Payer: Medicare Other | Attending: Emergency Medicine | Admitting: Emergency Medicine

## 2015-01-27 ENCOUNTER — Emergency Department (HOSPITAL_COMMUNITY): Payer: Medicare Other

## 2015-01-27 ENCOUNTER — Encounter (HOSPITAL_COMMUNITY): Payer: Self-pay | Admitting: *Deleted

## 2015-01-27 DIAGNOSIS — R0789 Other chest pain: Secondary | ICD-10-CM | POA: Diagnosis not present

## 2015-01-27 DIAGNOSIS — I129 Hypertensive chronic kidney disease with stage 1 through stage 4 chronic kidney disease, or unspecified chronic kidney disease: Secondary | ICD-10-CM | POA: Insufficient documentation

## 2015-01-27 DIAGNOSIS — J441 Chronic obstructive pulmonary disease with (acute) exacerbation: Secondary | ICD-10-CM | POA: Diagnosis not present

## 2015-01-27 DIAGNOSIS — R079 Chest pain, unspecified: Secondary | ICD-10-CM | POA: Insufficient documentation

## 2015-01-27 DIAGNOSIS — Z79899 Other long term (current) drug therapy: Secondary | ICD-10-CM | POA: Diagnosis not present

## 2015-01-27 DIAGNOSIS — R05 Cough: Secondary | ICD-10-CM | POA: Diagnosis not present

## 2015-01-27 DIAGNOSIS — R0602 Shortness of breath: Secondary | ICD-10-CM | POA: Diagnosis not present

## 2015-01-27 DIAGNOSIS — F329 Major depressive disorder, single episode, unspecified: Secondary | ICD-10-CM | POA: Diagnosis not present

## 2015-01-27 DIAGNOSIS — E119 Type 2 diabetes mellitus without complications: Secondary | ICD-10-CM | POA: Insufficient documentation

## 2015-01-27 DIAGNOSIS — M1990 Unspecified osteoarthritis, unspecified site: Secondary | ICD-10-CM | POA: Insufficient documentation

## 2015-01-27 DIAGNOSIS — E782 Mixed hyperlipidemia: Secondary | ICD-10-CM | POA: Diagnosis not present

## 2015-01-27 DIAGNOSIS — E669 Obesity, unspecified: Secondary | ICD-10-CM | POA: Diagnosis not present

## 2015-01-27 DIAGNOSIS — E079 Disorder of thyroid, unspecified: Secondary | ICD-10-CM | POA: Diagnosis not present

## 2015-01-27 DIAGNOSIS — N189 Chronic kidney disease, unspecified: Secondary | ICD-10-CM | POA: Diagnosis not present

## 2015-01-27 DIAGNOSIS — Z7982 Long term (current) use of aspirin: Secondary | ICD-10-CM | POA: Insufficient documentation

## 2015-01-27 DIAGNOSIS — R51 Headache: Secondary | ICD-10-CM | POA: Diagnosis not present

## 2015-01-27 DIAGNOSIS — R6884 Jaw pain: Secondary | ICD-10-CM | POA: Diagnosis not present

## 2015-01-27 LAB — CBC
HCT: 40.1 % (ref 36.0–46.0)
HEMOGLOBIN: 12.4 g/dL (ref 12.0–15.0)
MCH: 30.3 pg (ref 26.0–34.0)
MCHC: 30.9 g/dL (ref 30.0–36.0)
MCV: 98 fL (ref 78.0–100.0)
PLATELETS: 222 10*3/uL (ref 150–400)
RBC: 4.09 MIL/uL (ref 3.87–5.11)
RDW: 13.6 % (ref 11.5–15.5)
WBC: 7.9 10*3/uL (ref 4.0–10.5)

## 2015-01-27 LAB — BASIC METABOLIC PANEL
ANION GAP: 11 (ref 5–15)
BUN: 22 mg/dL — ABNORMAL HIGH (ref 6–20)
CALCIUM: 8.8 mg/dL — AB (ref 8.9–10.3)
CO2: 33 mmol/L — ABNORMAL HIGH (ref 22–32)
CREATININE: 1.58 mg/dL — AB (ref 0.44–1.00)
Chloride: 96 mmol/L — ABNORMAL LOW (ref 101–111)
GFR calc non Af Amer: 31 mL/min — ABNORMAL LOW (ref >60)
GFR, EST AFRICAN AMERICAN: 36 mL/min — AB (ref >60)
Glucose, Bld: 121 mg/dL — ABNORMAL HIGH (ref 65–99)
Potassium: 3.3 mmol/L — ABNORMAL LOW (ref 3.5–5.1)
Sodium: 140 mmol/L (ref 135–145)

## 2015-01-27 LAB — I-STAT ARTERIAL BLOOD GAS, ED
ACID-BASE EXCESS: 8 mmol/L — AB (ref 0.0–2.0)
Bicarbonate: 34 mEq/L — ABNORMAL HIGH (ref 20.0–24.0)
O2 SAT: 96 %
PH ART: 7.422 (ref 7.350–7.450)
Patient temperature: 99
TCO2: 36 mmol/L (ref 0–100)
pCO2 arterial: 52.3 mmHg — ABNORMAL HIGH (ref 35.0–45.0)
pO2, Arterial: 86 mmHg (ref 80.0–100.0)

## 2015-01-27 LAB — I-STAT TROPONIN, ED
Troponin i, poc: 0 ng/mL (ref 0.00–0.08)
Troponin i, poc: 0 ng/mL (ref 0.00–0.08)

## 2015-01-27 LAB — BRAIN NATRIURETIC PEPTIDE: B NATRIURETIC PEPTIDE 5: 6.1 pg/mL (ref 0.0–100.0)

## 2015-01-27 MED ORDER — PREDNISONE 20 MG PO TABS: 60.0000 mg | ORAL_TABLET | Freq: Every day | ORAL | Status: DC

## 2015-01-27 MED ORDER — PREDNISONE 20 MG PO TABS
60.0000 mg | ORAL_TABLET | Freq: Once | ORAL | Status: AC
  Administered 2015-01-27: 60 mg via ORAL
  Filled 2015-01-27: qty 3

## 2015-01-27 MED ORDER — POTASSIUM CHLORIDE CRYS ER 20 MEQ PO TBCR
40.0000 meq | EXTENDED_RELEASE_TABLET | Freq: Once | ORAL | Status: AC
  Administered 2015-01-27: 40 meq via ORAL
  Filled 2015-01-27: qty 2

## 2015-01-27 NOTE — ED Provider Notes (Signed)
CSN: [id number]     Arrival date & time 01/27/15  8657 History   First MD Initiated Contact with Patient 01/27/15 1101     Chief Complaint  Patient presents with  . Chest Pain    HPI   [name] is a 74 y.o. female with a PMH of DM, HTN, HLD, meningioma, COPD on 2L home O2 who presents to the ED with chest pain. She reports she typically sleeps on 3 pillows, and states when she woke up this morning she rolled off of her pillows and experienced sharp pain in the center of her chest and in her left jaw. She reports her chest pain lasted for 10-15 minutes, but states she is still experiencing pressure in her chest and aching in her left jaw. She reports she was diagnosed with a meningioma, and has experienced left jaw pain in the past, which she attributes to her meningioma. She has not tried anything for symptom relief. She reports HA, which she states is unchanged from the headaches she has experienced in the past. She denies lightheadedness, dizziness, syncope, palpitations. She reports shortness of breath, and states she uses oxygen at night and when she is exerting herself, but is supposed to be on oxygen at all times. She has not had an increase in her oxygen requirement. She reports cough on the way to the ED this morning that was productive of yellow-green sputum. She denies congestion or recent illness. She denies abdominal pain, nausea, vomiting, diarrhea, constipation, dysuria, urgency, frequency.   Past Medical History  Diagnosis Date  . Diabetes mellitus   . Hypertension   . Thyroid disease   . Arthritis     endstage changes bilateral knees/bilateral ankles.   . High cholesterol   . 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 02/17/2012    Continue diplopia, left eye pain and left headaches.    . Depression, reactive   . RA (rheumatoid arthritis)     has been off methotreaxte since 10/13.  . Contusion of left knee     due to fall 1/14.  [name] URI (upper  respiratory infection)   . Clotting disorder     pt denies this  . Asthma   . Carotid artery occlusion   . Interstitial lung disease   . Morbid obesity   . Shortness of breath   . Family history of heart disease   . Chronic kidney disease   . COPD (chronic obstructive pulmonary disease)     pulmonary fibrosis  . Normal coronary arteries     cardiac catheterization performed  10/31/14  . Diastolic dysfunction    Past Surgical History  Procedure Laterality Date  . Abdominal hysterectomy    . Ovary surgery    . Shoulder surgery Left   . Brain surgery      Gamma knife 10/13. Needs repeat spring  '14  . Video bronchoscopy Bilateral 05/31/2013    Procedure: VIDEO BRONCHOSCOPY WITHOUT FLUORO;  Surgeon: Brand Males, MD;  Location: [facility];  Service: Cardiopulmonary;  Laterality: Bilateral;  . Video bronscoscopy  2000    lung  . Tonsillectomy  age 24  . Esophagogastroduodenoscopy (egd) with propofol N/A 09/14/2014    Procedure: ESOPHAGOGASTRODUODENOSCOPY (EGD) WITH PROPOFOL;  Surgeon: [name], MD;  Location: [facility];  Service: Endoscopy;  Laterality: N/A;  . Cardiac catheterization N/A 10/31/2014    Procedure: Right/Left Heart Cath and Coronary Angiography;  Surgeon: [name], MD;  Location: [facility];  Service: Cardiovascular;  Laterality: N/A;   Family History  Problem Relation Age of Onset  . Diabetes Mother   . Heart attack Mother   . Hypertension Father   . Lung cancer Father   . Diabetes Sister   . Diabetes Brother   . Hypertension Brother   . Kidney cancer Brother   . Uterine cancer Daughter   . Breast cancer Sister   . Rheum arthritis Maternal Uncle   . Gout Brother   . Kidney failure Brother     x 5  . Heart disease Brother   . Heart attack Brother    Social History  Substance Use Topics  . Smoking status: Never Smoker   . Smokeless tobacco: Never Used  . Alcohol Use: No   OB History    No data available      Review  of Systems  Constitutional: Negative for fever, chills, activity change, appetite change and fatigue.  HENT: Negative for congestion.   Respiratory: Positive for cough and shortness of breath.   Cardiovascular: Positive for chest pain and leg swelling. Negative for palpitations.  Gastrointestinal: Negative for nausea, vomiting, abdominal pain, diarrhea, constipation and abdominal distention.  Genitourinary: Negative for dysuria, urgency and frequency.  Musculoskeletal: Positive for arthralgias.       Reports left jaw pain.  Neurological: Positive for headaches. Negative for dizziness, syncope, weakness, light-headedness and numbness.  All other systems reviewed and are negative.     Allergies  Codeine; Remicade; and Zestril  Home Medications   Prior to Admission medications   Medication Sig Start Date End Date Taking? Authorizing Provider  ACCU-CHEK SMARTVIEW test strip 1 each by Other route every morning.  03/18/14   Historical Provider, MD  acetaminophen (TYLENOL) 500 MG tablet Take 1,000 mg by mouth 2 (two) times daily as needed for pain. Takes with tramadol    Historical Provider, MD  albuterol (PROVENTIL HFA;VENTOLIN HFA) 108 (90 BASE) MCG/ACT inhaler Inhale 2 puffs into the lungs every 6 (six) hours as needed for wheezing or shortness of breath.    Historical Provider, MD  allopurinol (ZYLOPRIM) 100 MG tablet Take 2 tablets (200 mg total) by mouth daily. 06/21/14   Meredith Staggers, MD  aspirin EC 81 MG tablet Take 81 mg by mouth every morning.     Historical Provider, MD  atorvastatin (LIPITOR) 20 MG tablet Take 10 mg by mouth every evening.     Historical Provider, MD  BENICAR HCT 40-25 MG per tablet Take 0.5 tablets by mouth every morning.  05/04/14   Historical Provider, MD  Calcium Carb-Cholecalciferol (CALCIUM + D3 PO) Take 2 tablets by mouth daily with lunch.     Historical Provider, MD  cholecalciferol (VITAMIN D) 1000 UNITS tablet Take 2,000 Units by mouth daily with lunch.      Historical Provider, MD  colchicine 0.6 MG tablet Take 0.6 mg by mouth daily as needed (GOUT).    Historical Provider, MD  diclofenac sodium (VOLTAREN) 1 % GEL Apply 1 application topically 3 (three) times daily. To right knee 01/18/14   Meredith Staggers, MD  DULoxetine (CYMBALTA) 60 MG capsule Take 1 capsule (60 mg total) by mouth daily. 11/15/14   Meredith Staggers, MD  famotidine (PEPCID) 20 MG tablet Take 1 tablet (20 mg total) by mouth at bedtime. 11/02/14   Erlene Quan, PA-C  furosemide (LASIX) 40 MG tablet Take 1 tablet (40 mg total) by mouth daily. 11/08/14   Isaiah Serge, NP  gabapentin (  NEURONTIN) 600 MG tablet Take 1 tablet (600 mg total) by mouth as directed. Take 2 tablet in the morning, 2 tablet in the middle of the day, and 3 tablets at night 08/01/13   Meredith Staggers, MD  levothyroxine (SYNTHROID, LEVOTHROID) 50 MCG tablet Take 1 tablet (50 mcg total) by mouth daily. 07/23/12   Ivan Anchors Love, PA-C  linagliptin (TRADJENTA) 5 MG TABS tablet Take 5 mg by mouth every morning.     Historical Provider, MD  metoprolol succinate (TOPROL-XL) 100 MG 24 hr tablet Take 100 mg by mouth every morning. Take with or immediately following a meal.    Historical Provider, MD  morphine (MS CONTIN) 15 MG 12 hr tablet Take 1 tablet (15 mg total) by mouth every 12 (twelve) hours. 01/17/15   Meredith Staggers, MD  morphine (MSIR) 15 MG tablet Take 0.5-1 tablets (7.5-15 mg total) by mouth every 6 (six) hours as needed for moderate pain. 01/17/15   Meredith Staggers, MD  Multiple Vitamin (MULTIVITAMIN WITH MINERALS) TABS Take 1 tablet by mouth daily with lunch.     Historical Provider, MD  OXYGEN-HELIUM IN Inhale 2 L into the lungs at bedtime. And on exertion    Historical Provider, MD  pantoprazole (PROTONIX) 40 MG tablet Take 40 mg by mouth daily before breakfast.     Historical Provider, MD  pantoprazole (PROTONIX) 40 MG tablet TAKE 1 TABLET BY MOUTH DAILY, MINUTES BEFORE IST MEAL  OF THE DAY 01/23/15   Brand Males, MD  predniSONE (DELTASONE) 10 MG tablet Take 2 tablets (20 mg total) by mouth daily with breakfast. Patient taking differently: Take 7.5 mg by mouth daily with breakfast.  11/02/14   Erlene Quan, PA-C  Spacer/Aero-Holding Chambers (AEROCHAMBER MV) inhaler Use as instructed 06/10/13   Tammy S Parrett, NP    BP 137/81 mmHg  Pulse 99  Temp(Src) 99 F (37.2 C) (Oral)  Resp 18  SpO2 96% Physical Exam  Constitutional: She is oriented to person, place, and time. She appears well-developed and well-nourished. No distress.  Obese female in no acute distress.  HENT:  Head: Normocephalic and atraumatic.  Right Ear: External ear normal.  Left Ear: External ear normal.  Nose: Nose normal.  Mouth/Throat: Uvula is midline, oropharynx is clear and moist and mucous membranes are normal.  Eyes: Conjunctivae, EOM and lids are normal. Pupils are equal, round, and reactive to light. Right eye exhibits no discharge. Left eye exhibits no discharge. No scleral icterus.  Neck: Normal range of motion. Neck supple.  Cardiovascular: Normal rate, regular rhythm, normal heart sounds, intact distal pulses and normal pulses.   Pulmonary/Chest: Effort normal and breath sounds normal. No respiratory distress. She has no wheezes. She has no rales. She exhibits tenderness.  On 2L O2. Anterior chest wall TTP over sternum.  Abdominal: Soft. Normal appearance and bowel sounds are normal. She exhibits no distension and no mass. There is no tenderness. There is no rigidity, no rebound and no guarding.  Musculoskeletal: Normal range of motion. She exhibits edema. She exhibits no tenderness.  1+ pitting edema to lower extremities bilaterally.  Neurological: She is alert and oriented to person, place, and time. She has normal strength. No cranial nerve deficit or sensory deficit.  Skin: Skin is warm, dry and intact. No rash noted. She is not diaphoretic. No erythema. No pallor.  Psychiatric: She has a normal mood and  affect. Her speech is normal and behavior is normal. Judgment and thought content normal.  Nursing note and vitals reviewed.   ED Course  Procedures (including critical care time)  Labs Review Labs Reviewed  BASIC METABOLIC PANEL - Abnormal; Notable for the following:    Potassium 3.3 (*)    Chloride 96 (*)    CO2 33 (*)    Glucose, Bld 121 (*)    BUN 22 (*)    Creatinine, Ser 1.58 (*)    Calcium 8.8 (*)    GFR calc non Af Amer 31 (*)    GFR calc Af Amer 36 (*)    All other components within normal limits  I-STAT ARTERIAL BLOOD GAS, ED - Abnormal; Notable for the following:    pCO2 arterial 52.3 (*)    Bicarbonate 34.0 (*)    Acid-Base Excess 8.0 (*)    All other components within normal limits  CBC  BRAIN NATRIURETIC PEPTIDE  BLOOD GAS, ARTERIAL  I-STAT TROPOININ, ED  I-STAT TROPOININ, ED    Imaging Review Dg Chest 2 View  01/27/2015   CLINICAL DATA:  Chest pain and productive cough and shortness of breath  EXAM: CHEST  2 VIEW  COMPARISON:  10/30/2014, chest CT 11/29/2014  FINDINGS: Skin folds overlie the lung bases. No focal pulmonary opacity. Diffusely prominent interstitial reticular opacities are noted. Heart size is normal. No pleural effusion. No acute osseous abnormality.  IMPRESSION: No acute cardiopulmonary process.   Electronically Signed   By: Conchita Paris M.D.   On: 01/27/2015 11:08   I have personally reviewed and evaluated these images and lab results as part of my medical decision-making.   EKG Interpretation   Date/Time:  Saturday January 27 2015 09:21:43 EDT Ventricular Rate:  98 PR Interval:  140 QRS Duration: 80 QT Interval:  368 QTC Calculation: 469 R Axis:   -17 Text Interpretation:  Normal sinus rhythm Low voltage QRS Borderline ECG  No significant change since last tracing Confirmed by YAO  MD, DAVID  ([id number]) on 01/27/2015 1:45:11 PM      MDM   Final diagnoses:  Chest pain, unspecified chest pain type   74 year old female presents  with chest pain and left sided jaw pain, which began this morning after waking up and "rolling over." Reports productive cough en route to the ED, no hemoptysis. Reports shortness of breath, on 2L home O2 with no increased oxygen requirement. Reports headache, unchanged from typical headache.  Patient is afebrile. Vital signs stable. Regular rate and rhythm, lungs clear to auscultation bilaterally. Abdomen soft, non-tender, non-distended. 1+ pitting edema to lower extremities. Normal neuro exam with no focal neuro deficit. Strength and sensation intact.  Cath in June 2016 revealed mild CAD without significant obstructive disease, mild pulmonary HTN. Nuclear stress in June 2015 low risk. Echo in June 2015 with EF 72-62%, grade I diastolic dysfunction.  EKG negative for acute ischemia. Troponin negative x 2. CBC negative for leukocytosis, BMP with potassium 3.3, repleted in the ED, bicarb 33. ABG with bicarb 34, likely due to chronic respiratory acidosis with chronic metabolic compensation. Chest x-ray demonstrates diffusely prominent interstitial reticular opacities, no focal pulmonary opacity, no acute cardiopulmonary process. No evidence of ACS or pneumonia. Symptoms most likely consistent with patient's history of interstitial lung disease. Reports she has taken prednisone since 1999, and has been on various doses and tapers since that time. Was recently transitioned from 10 to 7.5 mg daily. Given prednisone in the ED and 5 day course of prednisone. Patient to follow-up with PCP to determine taper. Return precautions discussed.  Marella Chimes, PA-C 01/27/15 1714  Wandra Arthurs, MD 01/28/15 [phone]

## 2015-01-27 NOTE — ED Notes (Signed)
Pt reports mid chest pain that radiates into jaw and neck this am. ekg done at triage. No resp distress noted.

## 2015-01-27 NOTE — Discharge Instructions (Signed)
1. Medications: prednisone, usual home medications 2. Treatment: rest, drink plenty of fluids 3. Follow Up: please followup with your primary doctor for discussion of your diagnoses and further evaluation after today's visit; please return to the ER for severe chest pain, shortness of breath, dizziness, loss of consciousness, fever, new or worsening symptoms   Chest Pain (Nonspecific) It is often hard to give a specific diagnosis for the cause of chest pain. There is always a chance that your pain could be related to something serious, such as a heart attack or a blood clot in the lungs. You need to follow up with your health care provider for further evaluation. CAUSES   Heartburn.  Pneumonia or bronchitis.  Anxiety or stress.  Inflammation around your heart (pericarditis) or lung (pleuritis or pleurisy).  A blood clot in the lung.  A collapsed lung (pneumothorax). It can develop suddenly on its own (spontaneous pneumothorax) or from trauma to the chest.  Shingles infection (herpes zoster virus). The chest wall is composed of bones, muscles, and cartilage. Any of these can be the source of the pain.  The bones can be bruised by injury.  The muscles or cartilage can be strained by coughing or overwork.  The cartilage can be affected by inflammation and become sore (costochondritis). DIAGNOSIS  Lab tests or other studies may be needed to find the cause of your pain. Your health care provider may have you take a test called an ambulatory electrocardiogram (ECG). An ECG records your heartbeat patterns over a 24-hour period. You may also have other tests, such as:  Transthoracic echocardiogram (TTE). During echocardiography, sound waves are used to evaluate how blood flows through your heart.  Transesophageal echocardiogram (TEE).  Cardiac monitoring. This allows your health care provider to monitor your heart rate and rhythm in real time.  Holter monitor. This is a portable device  that records your heartbeat and can help diagnose heart arrhythmias. It allows your health care provider to track your heart activity for several days, if needed.  Stress tests by exercise or by giving medicine that makes the heart beat faster. TREATMENT   Treatment depends on what may be causing your chest pain. Treatment may include:  Acid blockers for heartburn.  Anti-inflammatory medicine.  Pain medicine for inflammatory conditions.  Antibiotics if an infection is present.  You may be advised to change lifestyle habits. This includes stopping smoking and avoiding alcohol, caffeine, and chocolate.  You may be advised to keep your head raised (elevated) when sleeping. This reduces the chance of acid going backward from your stomach into your esophagus. Most of the time, nonspecific chest pain will improve within 2-3 days with rest and mild pain medicine.  HOME CARE INSTRUCTIONS   If antibiotics were prescribed, take them as directed. Finish them even if you start to feel better.  For the next few days, avoid physical activities that bring on chest pain. Continue physical activities as directed.  Do not use any tobacco products, including cigarettes, chewing tobacco, or electronic cigarettes.  Avoid drinking alcohol.  Only take medicine as directed by your health care provider.  Follow your health care provider's suggestions for further testing if your chest pain does not go away.  Keep any follow-up appointments you made. If you do not go to an appointment, you could develop lasting (chronic) problems with pain. If there is any problem keeping an appointment, call to reschedule. SEEK MEDICAL CARE IF:   Your chest pain does not go away, even  after treatment.  You have a rash with blisters on your chest.  You have a fever. SEEK IMMEDIATE MEDICAL CARE IF:   You have increased chest pain or pain that spreads to your arm, neck, jaw, back, or abdomen.  You have shortness of  breath.  You have an increasing cough, or you cough up blood.  You have severe back or abdominal pain.  You feel nauseous or vomit.  You have severe weakness.  You faint.  You have chills. This is an emergency. Do not wait to see if the pain will go away. Get medical help at once. Call your local emergency services (911 in [place] Do not drive yourself to the hospital. MAKE SURE YOU:   Understand these instructions.  Will watch your condition.  Will get help right away if you are not doing well or get worse. Document Released: 02/12/2005 Document Revised: 05/10/2013 Document Reviewed: 12/09/2007 [facility] Patient Information 2015 [place], [place]. This information is not intended to replace advice given to you by your health care provider. Make sure you discuss any questions you have with your health care provider.

## 2015-01-29 DIAGNOSIS — M170 Bilateral primary osteoarthritis of knee: Secondary | ICD-10-CM | POA: Diagnosis not present

## 2015-01-29 DIAGNOSIS — M2550 Pain in unspecified joint: Secondary | ICD-10-CM | POA: Diagnosis not present

## 2015-01-29 DIAGNOSIS — M316 Other giant cell arteritis: Secondary | ICD-10-CM | POA: Diagnosis not present

## 2015-01-29 DIAGNOSIS — Z79899 Other long term (current) drug therapy: Secondary | ICD-10-CM | POA: Diagnosis not present

## 2015-01-29 DIAGNOSIS — M1A00X0 Idiopathic chronic gout, unspecified site, without tophus (tophi): Secondary | ICD-10-CM | POA: Diagnosis not present

## 2015-01-29 DIAGNOSIS — M5137 Other intervertebral disc degeneration, lumbosacral region: Secondary | ICD-10-CM | POA: Diagnosis not present

## 2015-01-30 ENCOUNTER — Encounter (HOSPITAL_COMMUNITY): Payer: Medicare Other

## 2015-02-01 ENCOUNTER — Encounter (HOSPITAL_COMMUNITY): Payer: Medicare Other

## 2015-02-01 DIAGNOSIS — J309 Allergic rhinitis, unspecified: Secondary | ICD-10-CM | POA: Diagnosis not present

## 2015-02-01 DIAGNOSIS — R51 Headache: Secondary | ICD-10-CM | POA: Diagnosis not present

## 2015-02-01 DIAGNOSIS — Z09 Encounter for follow-up examination after completed treatment for conditions other than malignant neoplasm: Secondary | ICD-10-CM | POA: Diagnosis not present

## 2015-02-06 ENCOUNTER — Encounter (HOSPITAL_COMMUNITY): Payer: Medicare Other

## 2015-02-06 NOTE — Progress Notes (Signed)
Pulmonary Rehab Discharge Note: [name] has been discharged from pulmonary rehab after her first and only exercise session. [name] suffers with chronic leg and knee pain. She is prescribed pain medication for daily use. After her first exercise session [name] presented to the ED for pain 10/10. She has decided to request discharge from the program until she is able to get her pain under control. According to [name], her morbid obesity plays a great part in her knee pain. She was provided information about a medically supervised weight loss program by our Dietitian at her exercise appointment. She has been encouraged to remain as active as possible and to follow thru with contacting the weight loss program. We will be happy to readmit her in the future.

## 2015-02-08 ENCOUNTER — Encounter (HOSPITAL_COMMUNITY): Payer: Medicare Other

## 2015-02-09 ENCOUNTER — Encounter: Payer: Self-pay | Admitting: Internal Medicine

## 2015-02-09 ENCOUNTER — Ambulatory Visit (INDEPENDENT_AMBULATORY_CARE_PROVIDER_SITE_OTHER): Payer: Medicare Other | Admitting: Internal Medicine

## 2015-02-09 VITALS — BP 132/86 | HR 94 | Ht 64.0 in | Wt 293.0 lb

## 2015-02-09 DIAGNOSIS — Z23 Encounter for immunization: Secondary | ICD-10-CM | POA: Diagnosis not present

## 2015-02-09 DIAGNOSIS — J9611 Chronic respiratory failure with hypoxia: Secondary | ICD-10-CM | POA: Diagnosis not present

## 2015-02-09 DIAGNOSIS — J849 Interstitial pulmonary disease, unspecified: Secondary | ICD-10-CM

## 2015-02-09 DIAGNOSIS — R0689 Other abnormalities of breathing: Secondary | ICD-10-CM

## 2015-02-09 DIAGNOSIS — I200 Unstable angina: Secondary | ICD-10-CM

## 2015-02-09 DIAGNOSIS — R0600 Dyspnea, unspecified: Secondary | ICD-10-CM | POA: Diagnosis not present

## 2015-02-09 NOTE — Progress Notes (Signed)
Subjective:    Patient ID: [name], female    DOB: [birth year], 74 y.o.   MRN: [id number]  HPI   OV 02/09/2015  Chief Complaint  Patient presents with  . Follow-up    Pt states her breathing is doing well the past few days. Pt recently went to [facility] ED for CP. Pt c/o prod cough with tan mucus. Pt denies CP/tightness    She has severe class 3 dyspnea with hypoxemia on account of BMI 50, diaast dsyfn, chronic pain, deconditioning and ILD due to RA. She is having progressive ILD. But given her miserable quality of life and polypharmacy and other side effects we decided to stop the CellCept and Bactrim. We also advise prednisone taper. This was all in the end of August 2016. But on 01/27/2015 she ended up in the emergency room with some respiratory difficulty. Her prednisone was bumped up. She subsequently saw her rheumatologist [name] was now tapering her prednisone again. There is also some concerns of headaches and possibly temporal arteritis based on her description and apparently some kind of a biopsy scheduled. She feels that since stopping her CellCept and Bactrim her cough is returned. Nevertheless weight and knee pain and back pain continued to be major issues for her along with polypharmacy. Her Polysorb was includes MS Contin  [name]     Current outpatient prescriptions:  .  ACCU-CHEK SMARTVIEW test strip, 1 each by Other route every morning. , Disp: , Rfl: 1 .  acetaminophen (TYLENOL) 500 MG tablet, Take 500-1,000 mg by mouth 2 (two) times daily as needed for pain. Takes with tramadol, Disp: , Rfl:  .  albuterol (PROVENTIL HFA;VENTOLIN HFA) 108 (90 BASE) MCG/ACT inhaler, Inhale 2 puffs into the lungs every 6 (six) hours as needed for wheezing or shortness of breath., Disp: , Rfl:  .  allopurinol (ZYLOPRIM) 100 MG tablet, Take 2 tablets (200 mg total) by mouth daily., Disp: 60 tablet, Rfl: 4 .  aspirin EC 81 MG tablet, Take 81 mg by mouth every  morning. , Disp: , Rfl:  .  atorvastatin (LIPITOR) 20 MG tablet, Take 10 mg by mouth every evening. , Disp: , Rfl:  .  BENICAR HCT 40-25 MG per tablet, Take 0.5 tablets by mouth every morning. , Disp: , Rfl: 0 .  Calcium Carb-Cholecalciferol (CALCIUM + D3 PO), Take 2 tablets by mouth daily with lunch. , Disp: , Rfl:  .  cholecalciferol (VITAMIN D) 1000 UNITS tablet, Take 2,000 Units by mouth daily with lunch. , Disp: , Rfl:  .  colchicine 0.6 MG tablet, Take 0.6 mg by mouth daily as needed (GOUT)., Disp: , Rfl:  .  diclofenac sodium (VOLTAREN) 1 % GEL, Apply 1 application topically 3 (three) times daily. To right knee, Disp: 3 Tube, Rfl: 4 .  DULoxetine (CYMBALTA) 60 MG capsule, Take 1 capsule (60 mg total) by mouth daily., Disp: 30 capsule, Rfl: 4 .  famotidine (PEPCID) 20 MG tablet, Take 1 tablet (20 mg total) by mouth at bedtime., Disp: 30 tablet, Rfl: 11 .  fluticasone (FLONASE) 50 MCG/ACT nasal spray, Place 2 sprays into both nostrils daily., Disp: , Rfl:  .  furosemide (LASIX) 40 MG tablet, Take 1 tablet (40 mg total) by mouth daily., Disp: 90 tablet, Rfl: 3 .  gabapentin (NEURONTIN) 600 MG tablet, Take 1 tablet (600 mg total) by mouth as directed. Take 2 tablet in the morning, 2 tablet in the middle of the  day, and 3 tablets at night, Disp: 210 tablet, Rfl: 1 .  guaiFENesin (MUCINEX) 600 MG 12 hr tablet, Take 1,200 mg by mouth 2 (two) times daily., Disp: , Rfl:  .  levothyroxine (SYNTHROID, LEVOTHROID) 50 MCG tablet, Take 1 tablet (50 mcg total) by mouth daily., Disp: 30 tablet, Rfl: 1 .  metoprolol succinate (TOPROL-XL) 100 MG 24 hr tablet, Take 100 mg by mouth every morning. Take with or immediately following a meal., Disp: , Rfl:  .  morphine (MS CONTIN) 15 MG 12 hr tablet, Take 1 tablet (15 mg total) by mouth every 12 (twelve) hours., Disp: 60 tablet, Rfl: 0 .  morphine (MSIR) 15 MG tablet, Take 0.5-1 tablets (7.5-15 mg total) by mouth every 6 (six) hours as needed for moderate pain.,  Disp: 60 tablet, Rfl: 0 .  Multiple Vitamin (MULTIVITAMIN WITH MINERALS) TABS, Take 1 tablet by mouth daily with lunch. , Disp: , Rfl:  .  OXYGEN-HELIUM IN, Inhale 2 L into the lungs at bedtime. And on exertion, Disp: , Rfl:  .  pantoprazole (PROTONIX) 40 MG tablet, Take 40 mg by mouth daily before breakfast. , Disp: , Rfl:  .  pantoprazole (PROTONIX) 40 MG tablet, TAKE 1 TABLET BY MOUTH DAILY, MINUTES BEFORE IST MEAL  OF THE DAY, Disp: 30 tablet, Rfl: 5 .  predniSONE (DELTASONE) 10 MG tablet, Take 2 tablets (20 mg total) by mouth daily with breakfast. (Patient taking differently: Take 10 mg by mouth daily with breakfast. ), Disp: 60 tablet, Rfl: 5 .  predniSONE (DELTASONE) 20 MG tablet, Take 3 tablets (60 mg total) by mouth daily., Disp: 15 tablet, Rfl: 0 .  sodium chloride (OCEAN) 0.65 % SOLN nasal spray, Place 1 spray into both nostrils as needed for congestion., Disp: , Rfl:  .  Spacer/Aero-Holding Chambers (AEROCHAMBER MV) inhaler, Use as instructed, Disp: 1 each, Rfl: 0 .  VICTOZA 18 MG/3ML SOPN, Inject 1.8 mg as directed daily. , Disp: , Rfl: 0   Immunization History  Administered Date(s) Administered  . Influenza Split 01/29/2012, 03/30/2013, 02/10/2014  . Pneumococcal Conjugate-13 08/21/2014  . Pneumococcal Polysaccharide-23 02/17/2012      Review of Systems  Constitutional: Negative for fever and unexpected weight change.  HENT: Negative for congestion, dental problem, ear pain, nosebleeds, postnasal drip, rhinorrhea, sinus pressure, sneezing, sore throat and trouble swallowing.   Eyes: Negative for redness and itching.  Respiratory: Positive for cough and shortness of breath. Negative for chest tightness and wheezing.   Cardiovascular: Negative for palpitations and leg swelling.  Gastrointestinal: Negative for nausea and vomiting.  Genitourinary: Negative for dysuria.  Musculoskeletal: Negative for joint swelling.  Skin: Negative for rash.  Neurological: Negative for  headaches.  Hematological: Does not bruise/bleed easily.  Psychiatric/Behavioral: Negative for dysphoric mood. The patient is not nervous/anxious.        Objective:   Physical Exam  Constitutional: She is oriented to person, place, and time. She appears well-developed and well-nourished. No distress.  Cheerful  HENT:  Head: Normocephalic and atraumatic.  Right Ear: External ear normal.  Left Ear: External ear normal.  Mouth/Throat: Oropharynx is clear and moist. No oropharyngeal exudate.  Morbidly obese Mallampati class IV Oxygen on  Eyes: Conjunctivae and EOM are normal. Pupils are equal, round, and reactive to light. Right eye exhibits no discharge. Left eye exhibits no discharge. No scleral icterus.  Neck: Normal range of motion. Neck supple. No JVD present. No tracheal deviation present. No thyromegaly present.  Cardiovascular: Normal rate, regular rhythm, normal  heart sounds and intact distal pulses.  Exam reveals no Mcdaniel and no friction rub.   No murmur heard. Pulmonary/Chest: Effort normal. No respiratory distress. She has no wheezes. She has rales. She exhibits no tenderness.  Abdominal: Soft. Bowel sounds are normal. She exhibits no distension and no mass. There is no tenderness. There is no rebound and no guarding.  Musculoskeletal: Normal range of motion. She exhibits edema. She exhibits no tenderness.  Chronic venous stasis edema  Lymphadenopathy:    She has no cervical adenopathy.  Neurological: She is alert and oriented to person, place, and time. She has normal reflexes. No cranial nerve deficit. She exhibits normal muscle tone. Coordination normal.  Skin: Skin is warm and dry. No rash noted. She is not diaphoretic. No erythema. No pallor.  Psychiatric: She has a normal mood and affect. Her behavior is normal. Judgment and thought content normal.  Vitals reviewed.   Filed Vitals:   02/09/15 1143  BP: 132/86  Pulse: 94  Height: _0  (1.626 m)  Weight: 293 lb  (132.904 kg)  SpO2: 97%         Assessment & Plan:     ICD-9-CM ICD-10-CM   1. ILD (interstitial lung disease) 515 J84.9   2. Dyspnea and respiratory abnormality 786.09 R06.00     R06.89   3. Chronic respiratory failure with hypoxia 518.83 J96.11    799.02      Clinically her ILD is stable although symptomatically she is worse after coming off CellCept because her cough is worse. At this point in time I do not want to start CellCept again just on the basis of some cough for which Mucinex is helping. I will leave the prednisone taper to rheumatologist because of possible other issues going on such as temporal arteritis possibly. I again counseled her to focus heavily on weight loss. Possibly follow an Atkin's diet. This is her only hope for better quality of life and longer life. The prognosis is actually pretty bad otherwise   (> 50% of this 15 min visit spent in face to face counseling or/and coordination of care)   Dr. Brand Males, M.D., [facility].C.P Pulmonary and Critical Care Medicine Staff Physician [facility] Pager: [phone], If no answer or between  15:00h - 7:00h: call [phone]  02/09/2015 12:09 PM

## 2015-02-09 NOTE — Patient Instructions (Signed)
ICD-9-CM ICD-10-CM   1. ILD (interstitial lung disease) 515 J84.9   2. Dyspnea and respiratory abnormality 786.09 R06.00     R06.89   3. Chronic respiratory failure with hypoxia 518.83 J96.11    799.02     Stay off cellcept and bactrim - if cuoigh worsens we can consider restart Continue prednisone - further taper by Dr Estanislado Pandy Focus on weight loss  - try Atkins diet Flu shot 02/09/2015  Followup 2 months to see me

## 2015-02-13 ENCOUNTER — Encounter (HOSPITAL_COMMUNITY): Payer: Medicare Other

## 2015-02-15 ENCOUNTER — Encounter (HOSPITAL_COMMUNITY): Payer: Medicare Other

## 2015-02-16 ENCOUNTER — Encounter: Payer: Medicare Other | Admitting: Registered Nurse

## 2015-02-20 ENCOUNTER — Encounter (HOSPITAL_COMMUNITY): Payer: Medicare Other

## 2015-02-21 ENCOUNTER — Encounter: Payer: Self-pay | Admitting: Internal Medicine

## 2015-02-21 ENCOUNTER — Encounter: Payer: Self-pay | Admitting: Physical Medicine & Rehabilitation

## 2015-02-21 ENCOUNTER — Ambulatory Visit (INDEPENDENT_AMBULATORY_CARE_PROVIDER_SITE_OTHER): Payer: Medicare Other | Admitting: Internal Medicine

## 2015-02-21 ENCOUNTER — Encounter: Payer: Medicare Other | Attending: Physical Medicine & Rehabilitation | Admitting: Physical Medicine & Rehabilitation

## 2015-02-21 VITALS — BP 114/70 | HR 93 | Resp 14

## 2015-02-21 VITALS — BP 138/68 | HR 100 | Ht 64.0 in | Wt 300.0 lb

## 2015-02-21 DIAGNOSIS — G894 Chronic pain syndrome: Secondary | ICD-10-CM | POA: Diagnosis not present

## 2015-02-21 DIAGNOSIS — M5416 Radiculopathy, lumbar region: Secondary | ICD-10-CM | POA: Diagnosis not present

## 2015-02-21 DIAGNOSIS — I200 Unstable angina: Secondary | ICD-10-CM

## 2015-02-21 DIAGNOSIS — J209 Acute bronchitis, unspecified: Secondary | ICD-10-CM

## 2015-02-21 DIAGNOSIS — Z5181 Encounter for therapeutic drug level monitoring: Secondary | ICD-10-CM | POA: Insufficient documentation

## 2015-02-21 DIAGNOSIS — M4806 Spinal stenosis, lumbar region: Secondary | ICD-10-CM | POA: Insufficient documentation

## 2015-02-21 DIAGNOSIS — E1140 Type 2 diabetes mellitus with diabetic neuropathy, unspecified: Secondary | ICD-10-CM

## 2015-02-21 DIAGNOSIS — M7062 Trochanteric bursitis, left hip: Secondary | ICD-10-CM | POA: Diagnosis not present

## 2015-02-21 DIAGNOSIS — M47816 Spondylosis without myelopathy or radiculopathy, lumbar region: Secondary | ICD-10-CM | POA: Diagnosis not present

## 2015-02-21 DIAGNOSIS — R5382 Chronic fatigue, unspecified: Secondary | ICD-10-CM

## 2015-02-21 DIAGNOSIS — M05761 Rheumatoid arthritis with rheumatoid factor of right knee without organ or systems involvement: Secondary | ICD-10-CM

## 2015-02-21 DIAGNOSIS — M1712 Unilateral primary osteoarthritis, left knee: Secondary | ICD-10-CM | POA: Diagnosis not present

## 2015-02-21 DIAGNOSIS — M069 Rheumatoid arthritis, unspecified: Secondary | ICD-10-CM | POA: Diagnosis not present

## 2015-02-21 DIAGNOSIS — M48061 Spinal stenosis, lumbar region without neurogenic claudication: Secondary | ICD-10-CM

## 2015-02-21 DIAGNOSIS — Z79899 Other long term (current) drug therapy: Secondary | ICD-10-CM | POA: Insufficient documentation

## 2015-02-21 DIAGNOSIS — M1711 Unilateral primary osteoarthritis, right knee: Secondary | ICD-10-CM | POA: Diagnosis not present

## 2015-02-21 DIAGNOSIS — E079 Disorder of thyroid, unspecified: Secondary | ICD-10-CM

## 2015-02-21 MED ORDER — DOXYCYCLINE HYCLATE 100 MG PO TABS
ORAL_TABLET | ORAL | Status: DC
Start: 2015-02-21 — End: 2015-03-23

## 2015-02-21 MED ORDER — PREDNISONE 10 MG PO TABS
ORAL_TABLET | ORAL | Status: DC
Start: 2015-02-21 — End: 2015-03-23

## 2015-02-21 MED ORDER — MORPHINE SULFATE ER 30 MG PO TBCR: 15.0000 mg | EXTENDED_RELEASE_TABLET | Freq: Two times a day (BID) | ORAL | Status: DC

## 2015-02-21 MED ORDER — MORPHINE SULFATE 15 MG PO TABS: 7.5000 mg | ORAL_TABLET | Freq: Four times a day (QID) | ORAL | Status: DC | PRN

## 2015-02-21 NOTE — Progress Notes (Signed)
Subjective:    Patient ID: [name], female    DOB: 1940/07/01, 74 y.o.   MRN: [id number]  HPI  [name] is here in follow up of her pain. She has had progressively more pain in her knees and legs. She feels that her legs are "tight" and that the "muscles hurt". She has been increasingly limited with her exercise capacity due to her breathing and continued pain.   She wears her compression stockings daily. They do cause some discomfort by the end of the day.  We started MS CR 76m at last visit, but she really doesn't feel that it helped much. She conitnues on ms IR for breakthrough pain as well as gabapentin, cymbalta, and voltaren gel.   Pain Inventory Average Pain 6 Pain Right Now 4 My pain is sharp  In the last 24 hours, has pain interfered with the following? General activity 6 Relation with others 6 Enjoyment of life 7 What TIME of day is your pain at its worst? daytime Sleep (in general) Fair  Pain is worse with: walking and bending Pain improves with: rest and medication Relief from Meds: 7  Mobility walk with assistance use a cane use a walker how many minutes can you walk? 3-5 ability to climb steps?  no do you drive?  no use a wheelchair Do you have any goals in this area?  yes  Function retired I need assistance with the following:  meal prep, household duties and shopping  Neuro/Psych bladder control problems numbness tingling dizziness  Prior Studies Any changes since last visit?  no  Physicians involved in your care Any changes since last visit?  no   Family History  Problem Relation Age of Onset  . Diabetes Mother   . Heart attack Mother   . Hypertension Father   . Lung cancer Father   . Diabetes Sister   . Diabetes Brother   . Hypertension Brother   . Kidney cancer Brother   . Uterine cancer Daughter   . Breast cancer Sister   . Rheum arthritis Maternal Uncle   . Gout Brother   . Kidney failure Brother     x 5  .  Heart disease Brother   . Heart attack Brother    Social History   Social History  . Marital Status: Married    Spouse Name: N/A  . Number of Children: 6  . Years of Education: college   Occupational History  . 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 Smoking status: Never Smoker   . Smokeless tobacco: Never Used  . Alcohol Use: No  . Drug Use: No  . Sexual Activity: Not Currently   Other Topics Concern  . None   Social History Narrative   Patient consumes 2-3 cups coffee per day.   Past Surgical History  Procedure Laterality Date  . Abdominal hysterectomy    . Ovary surgery    . Shoulder surgery Left   . Brain surgery      Gamma knife 10/13. Needs repeat spring  '14  . Video bronchoscopy Bilateral 05/31/2013    Procedure: VIDEO BRONCHOSCOPY WITHOUT FLUORO;  Surgeon: MBrand Males MD;  Location: [facility]  Service: Cardiopulmonary;  Laterality: Bilateral;  . Video bronscoscopy  2000    lung  . Tonsillectomy  age 74 . Esophagogastroduodenoscopy (egd) with propofol N/A 09/14/2014    Procedure: ESOPHAGOGASTRODUODENOSCOPY (EGD) WITH PROPOFOL;  Surgeon: RInda Castle MD;  Location: [facility];  Service: Endoscopy;  Laterality: N/A;  . Cardiac catheterization N/A 10/31/2014    Procedure: Right/Left Heart Cath and Coronary Angiography;  Surgeon: [name], MD;  Location: [facility];  Service: Cardiovascular;  Laterality: N/A;   Past Medical History  Diagnosis Date  . Diabetes mellitus   . Hypertension   . Thyroid disease   . Arthritis     endstage changes bilateral knees/bilateral ankles.   . High cholesterol   . 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 (HCC) 02/17/2012    Continue diplopia, left eye pain and left headaches.    . Depression, reactive   . RA (rheumatoid arthritis) ([place])     has been off methotreaxte since 10/13.  . Contusion of left knee     due to fall 1/14.  [name] URI (upper respiratory infection)     . Clotting disorder ([place])     pt denies this  . Asthma   . Carotid artery occlusion   . Interstitial lung disease ([place])   . Morbid obesity ([place])   . Shortness of breath   . Family history of heart disease   . Chronic kidney disease   . COPD (chronic obstructive pulmonary disease) (HCC)     pulmonary fibrosis  . Normal coronary arteries     cardiac catheterization performed  10/31/14  . Diastolic dysfunction    BP 114/70 mmHg  Pulse 93  Resp 14  SpO2 94%  Opioid Risk Score:   Fall Risk Score:  `1  Depression screen PHQ 2/9  Depression screen [facility] 2/9 01/05/2015 09/20/2014 08/10/2013  Decreased Interest 2 3 -  Down, Depressed, Hopeless _0 PHQ - 2 Score _1 Altered sleeping 2 0 -  Tired, decreased energy 2 3 -  Change in appetite 2 3 -  Feeling bad or failure about yourself  0 3 -  Trouble concentrating 1 1 -  Moving slowly or fidgety/restless 0 2 -  Suicidal thoughts 0 0 -  PHQ-9 Score 10 16 -  Difficult doing work/chores Somewhat difficult - -     Review of Systems  Constitutional: Positive for unexpected weight change.  Respiratory: Positive for cough and shortness of breath.   Cardiovascular: Positive for leg swelling.  Musculoskeletal: Positive for gait problem.  Neurological: Positive for dizziness and numbness.       Tingling  All other systems reviewed and are negative.      Objective:   Physical Exam   General: Alert and oriented x 3, No apparent distress. obese  HEENT: Head is normocephalic, atraumatic, PERRLA, EOMI, sclera anicteric, oral mucosa pink and moist, dentition intact, ext ear canals clear,  Neck: Supple without JVD or lymphadenopathy  Heart: Reg rate and rhythm. No murmurs rubs or gallops  Chest: sob with gait, sometimes at rest. Phonates well  Abdomen: Soft, non-tender, non-distended, bowel sounds positive.  Extremities: No clubbing, cyanosis, 2+ edema still in bilateral legs, negative homan's, no warmth or erythema. Pulses intact..  Measured calf diameter and bother were the same 49.5 cm. Pulses are 2+. Legs are warm,normal temperature  Skin: Clean and intact without signs of breakdown  Neuro: Pt is cognitively appropriate with normal insight, memory, and awareness. Cranial nerves 2-12 are intact. Sensory exam decreased to LT in both legs below the knees.... Reflexes are 2+ in all 4's. Fine motor coordination is intact. No tremors. Motor function is grossly 4 to 5/5 with pain inhbition. Musculoskeletal: low back and left hip  less tender. She is slightly elevated in the left pelvis. She has difficulty standing today, comes to office in w/c in fact. She is tender to touch at both knees in medial and lateral compartments. Shins are sensitive as well.  Crepitus in both knees with AROM . Fingers lack flexion especially at DIP's. No catches or nodules palpated along volar aspects of fingers.  Psych: Pt's affect is appropriate. Pt is cooperative  Assessment & Plan:   Assessment:  1. Lumbar spinal stenosis with neurogenic claudication. Associated facet arthropathy  2. Depression.  3. Diabetes mellitus type 2 with polyneuropathy  4. Rheumatoid arthritis and osteoarthritis. Multiple joint involvement including knees- I have to believe that coming off her immunosuppressive medications has increased her pain levels.  5. Left and right greater troch bursitis  6. Interstitial lung disease  7. Multifactorial fatigue---Likely related to her volume/cbg's    Plan:  1. Volume management per cardiology. Minimization of lower extremity edema should help with her leg pain as well. 2. . Voltaren gel for fingers/knees.  Will also need to increase physical activity as possible. Additionally, we discussed massage, edema mgt, desensitization of her legs.  3. Narcotics:.  -increase MS CR 74m q12. #60 -  MS IR for breakthrough pain, 149m 1/2 to 1 q12 prn, #60   4 . Consider lyrica trial. Continue gabapentin for now.  5. Continue cymbalta 6044maily.   .  6. Follow up with me or NP in 1 month. 30 minutes of face to face patient care time were spent during this visit. All questions were encouraged and answered.

## 2015-02-21 NOTE — Patient Instructions (Addendum)
PLEASE CALL ME WITH ANY PROBLEMS OR QUESTIONS (#[phone]).  HAVE A GOOD DAY!   FIND A WAY TO MOVE A BIT MORE, WORK ON MASSAGE, LEG ELEVATION, EDEMA CONTROL, SUGAR CONTROL, DESENSITIZATION OF YOUR LEGS.

## 2015-02-21 NOTE — Progress Notes (Signed)
Subjective:    Patient ID: [name], female    DOB: 1941-03-27, 74 y.o.   MRN: [id number]  HPI     OV 02/09/2015  Chief Complaint  Patient presents with  . Follow-up    Pt states her breathing is doing well the past few days. Pt recently went to [facility] ED for CP. Pt c/o prod cough with tan mucus. Pt denies CP/tightness    She has severe class 3 dyspnea with hypoxemia on account of BMI 50, diaast dsyfn, chronic pain, deconditioning and ILD due to RA. She is having progressive ILD. But given her miserable quality of life and polypharmacy and other side effects we decided to stop the CellCept and Bactrim. We also advise prednisone taper. This was all in the end of August 2016. But on 01/27/2015 she ended up in the emergency room with some respiratory difficulty. Her prednisone was bumped up. She subsequently saw her rheumatologist [name] was now tapering her prednisone again. There is also some concerns of headaches and possibly temporal arteritis based on her description and apparently some kind of a biopsy scheduled. She feels that since stopping her CellCept and Bactrim her cough is returned. Nevertheless weight and knee pain and back pain continued to be major issues for her along with polypharmacy. Her Polysorb was includes MS Contin  [name]    OV 02/21/2015  - a ACUTE VISIT  Chief Complaint  Patient presents with  . Acute Visit    Pt c/o of chest tightness, productive cough with white/yellow mucus, and wheezing. Pt also c/o of feeling abdominal swelling/distention. Pt also c/o of constant fatigue. Pt states symptoms have been present x1 week.     Acute visit for this morbidly obese, rheumatoid arthritis patient with ILD who is now off CellCept  I saw her as recently as 02/09/2015. She reports that for the past 1 week she's having increased chest congestion, chest tightness, wheezing, shortness of breath, cough and yellow  sputum more than baseline.  Symptoms are rated as moderate in severity. Relieved by inhaler. She has tried Mucinex for 1 week with some initial partial relief but symptoms progressed. So she made an acute visit.  She continues to stay off CellCept. She is not on prednisone 10 mg per day. She tells me [name] has held her prednisone at the dose because of concerns of temporal arteritis but apparently now that his diagnoses being eliminated in the differential diagnosis. She will see [name] in the next few weeks to decide on further prednisone taper.   Immunization History  Administered Date(s) Administered  . Influenza Split 01/29/2012, 03/30/2013, 02/10/2014  . Influenza,inj,Quad PF,36+ Mos 02/09/2015  . Pneumococcal Conjugate-13 08/21/2014  . Pneumococcal Polysaccharide-23 02/17/2012      Current outpatient prescriptions:  .  ACCU-CHEK SMARTVIEW test strip, 1 each by Other route every morning. , Disp: , Rfl: 1 .  acetaminophen (TYLENOL) 500 MG tablet, Take 500-1,000 mg by mouth 2 (two) times daily as needed for pain. Takes with tramadol, Disp: , Rfl:  .  albuterol (PROVENTIL HFA;VENTOLIN HFA) 108 (90 BASE) MCG/ACT inhaler, Inhale 2 puffs into the lungs every 6 (six) hours as needed for wheezing or shortness of breath., Disp: , Rfl:  .  allopurinol (ZYLOPRIM) 100 MG tablet, Take 2 tablets (200 mg total) by mouth daily., Disp: 60 tablet, Rfl: 4 .  aspirin EC 81 MG tablet, Take 81 mg by mouth every morning. , Disp: ,  Rfl:  .  atorvastatin (LIPITOR) 20 MG tablet, Take 10 mg by mouth every evening. , Disp: , Rfl:  .  BENICAR HCT 40-25 MG per tablet, Take 0.5 tablets by mouth every morning. , Disp: , Rfl: 0 .  Calcium Carb-Cholecalciferol (CALCIUM + D3 PO), Take 2 tablets by mouth daily with lunch. , Disp: , Rfl:  .  cholecalciferol (VITAMIN D) 1000 UNITS tablet, Take 2,000 Units by mouth daily with lunch. , Disp: , Rfl:  .  colchicine 0.6 MG tablet, Take 0.6 mg by mouth daily as needed (GOUT)., Disp: , Rfl:  .  diclofenac  sodium (VOLTAREN) 1 % GEL, Apply 1 application topically 3 (three) times daily. To right knee, Disp: 3 Tube, Rfl: 4 .  DULoxetine (CYMBALTA) 60 MG capsule, Take 1 capsule (60 mg total) by mouth daily., Disp: 30 capsule, Rfl: 4 .  famotidine (PEPCID) 20 MG tablet, Take 1 tablet (20 mg total) by mouth at bedtime., Disp: 30 tablet, Rfl: 11 .  fluticasone (FLONASE) 50 MCG/ACT nasal spray, Place 2 sprays into both nostrils daily., Disp: , Rfl:  .  furosemide (LASIX) 40 MG tablet, Take 1 tablet (40 mg total) by mouth daily., Disp: 90 tablet, Rfl: 3 .  gabapentin (NEURONTIN) 600 MG tablet, Take 1 tablet (600 mg total) by mouth as directed. Take 2 tablet in the morning, 2 tablet in the middle of the day, and 3 tablets at night, Disp: 210 tablet, Rfl: 1 .  guaiFENesin (MUCINEX) 600 MG 12 hr tablet, Take 1,200 mg by mouth 2 (two) times daily., Disp: , Rfl:  .  levothyroxine (SYNTHROID, LEVOTHROID) 50 MCG tablet, Take 1 tablet (50 mcg total) by mouth daily., Disp: 30 tablet, Rfl: 1 .  metoprolol succinate (TOPROL-XL) 100 MG 24 hr tablet, Take 100 mg by mouth every morning. Take with or immediately following a meal., Disp: , Rfl:  .  morphine (MS CONTIN) 30 MG 12 hr tablet, Take 1 tablet (30 mg total) by mouth every 12 (twelve) hours., Disp: 60 tablet, Rfl: 0 .  Multiple Vitamin (MULTIVITAMIN WITH MINERALS) TABS, Take 1 tablet by mouth daily with lunch. , Disp: , Rfl:  .  OXYGEN-HELIUM IN, Inhale 2 L into the lungs at bedtime. And on exertion, Disp: , Rfl:  .  pantoprazole (PROTONIX) 40 MG tablet, TAKE 1 TABLET BY MOUTH DAILY, MINUTES BEFORE IST MEAL  OF THE DAY, Disp: 30 tablet, Rfl: 5 .  predniSONE (DELTASONE) 10 MG tablet, Take 2 tablets (20 mg total) by mouth daily with breakfast. (Patient taking differently: Take 10 mg by mouth daily with breakfast. ), Disp: 60 tablet, Rfl: 5 .  sodium chloride (OCEAN) 0.65 % SOLN nasal spray, Place 1 spray into both nostrils as needed for congestion., Disp: , Rfl:  .   Spacer/Aero-Holding Chambers (AEROCHAMBER MV) inhaler, Use as instructed, Disp: 1 each, Rfl: 0 .  VICTOZA 18 MG/3ML SOPN, Inject 1.8 mg as directed daily. , Disp: , Rfl: 0   Review of Systems   history of present illness otherwise 11 point review of system is negative.     Objective:   Physical Exam  Constitutional: She is oriented to person, place, and time. She appears well-developed and well-nourished. No distress.  Morbidly obese  HENT:  Head: Normocephalic and atraumatic.  Right Ear: External ear normal.  Left Ear: External ear normal.  Mouth/Throat: Oropharynx is clear and moist. No oropharyngeal exudate.  Mallampati class IV Mildly inflamed hypopharynx  Eyes: Conjunctivae and EOM are normal. Pupils  are equal, round, and reactive to light. Right eye exhibits no discharge. Left eye exhibits no discharge. No scleral icterus.  Neck: Normal range of motion. Neck supple. No JVD present. No tracheal deviation present. No thyromegaly present.  Cardiovascular: Normal rate, regular rhythm, normal heart sounds and intact distal pulses.  Exam reveals no gallop and no friction rub.   No murmur heard. Pulmonary/Chest: Effort normal and breath sounds normal. No respiratory distress. She has no wheezes. She has no rales. She exhibits no tenderness.  Occasional scattered wheeze  Abdominal: Soft. Bowel sounds are normal. She exhibits no distension and no mass. There is no tenderness. There is no rebound and no guarding.  Musculoskeletal: Normal range of motion. She exhibits edema. She exhibits no tenderness.  Using walker  Lymphadenopathy:    She has no cervical adenopathy.  Neurological: She is alert and oriented to person, place, and time. She has normal reflexes. No cranial nerve deficit. She exhibits normal muscle tone. Coordination normal.  Skin: Skin is warm and dry. No rash noted. She is not diaphoretic. No erythema. No pallor.  Psychiatric: She has a normal mood and affect. Her behavior  is normal. Judgment and thought content normal.  Vitals reviewed.   Filed Vitals:   02/21/15 1440  BP: 138/68  Pulse: 100  Height: _0  (1.626 m)  Weight: 300 lb (136.079 kg)  SpO2: 95%         Assessment & Plan:     ICD-9-CM ICD-10-CM   1. Acute bronchitis, unspecified organism 466.0 J20.9     Xopenex nebulizer in the office  Take doxycycline 176m po twice daily x 5 days; take after meals and avoid sunlight  - Please make sure take this after meals otherwise it'll cause nausea and vomiting  Please take Take prednisone 482monce daily x 3 days, then 3028mnce daily x 3 days, then 84m42mce daily x 3 days, then prednisone 10mg77me daily to continue baseline dose   Followup  - Change follow-up to January 2017     Dr. MuralBrand Males., F.C.C.P Pulmonary and Critical Care Medicine Staff Physician [facility] Pager: [phone] answer or between  15:00h - 7:00h: call [phone]  02/21/2015 3:12 PM

## 2015-02-21 NOTE — Patient Instructions (Addendum)
ICD-9-CM ICD-10-CM   1. Acute bronchitis, unspecified organism 466.0 J20.9    Xopenex nebulizer in the office  Take doxycycline 132m po twice daily x 5 days; take after meals and avoid sunlight  - Please make sure take this after meals otherwise it'll cause nausea and vomiting  Please take Take prednisone 453monce daily x 3 days, then 3069mnce daily x 3 days, then 12m70mce daily x 3 days, then prednisone 10mg38me daily to continue baseline dose   Followup  - Change follow-up to January 2017

## 2015-02-21 NOTE — Addendum Note (Signed)
Addended by: [name] March on: 02/21/2015 03:19 PM   Modules accepted: Orders

## 2015-02-22 ENCOUNTER — Encounter (HOSPITAL_COMMUNITY): Payer: Medicare Other

## 2015-02-22 DIAGNOSIS — I129 Hypertensive chronic kidney disease with stage 1 through stage 4 chronic kidney disease, or unspecified chronic kidney disease: Secondary | ICD-10-CM | POA: Diagnosis not present

## 2015-02-22 DIAGNOSIS — J40 Bronchitis, not specified as acute or chronic: Secondary | ICD-10-CM | POA: Diagnosis not present

## 2015-02-22 DIAGNOSIS — N183 Chronic kidney disease, stage 3 (moderate): Secondary | ICD-10-CM | POA: Diagnosis not present

## 2015-02-22 DIAGNOSIS — N08 Glomerular disorders in diseases classified elsewhere: Secondary | ICD-10-CM | POA: Diagnosis not present

## 2015-02-22 DIAGNOSIS — E1122 Type 2 diabetes mellitus with diabetic chronic kidney disease: Secondary | ICD-10-CM | POA: Diagnosis not present

## 2015-02-27 ENCOUNTER — Encounter (HOSPITAL_COMMUNITY): Payer: Medicare Other

## 2015-02-28 ENCOUNTER — Ambulatory Visit: Payer: Medicare Other | Admitting: Physical Medicine & Rehabilitation

## 2015-03-01 ENCOUNTER — Encounter (HOSPITAL_COMMUNITY): Payer: Medicare Other

## 2015-03-06 ENCOUNTER — Encounter (HOSPITAL_COMMUNITY): Payer: Medicare Other

## 2015-03-08 ENCOUNTER — Encounter (HOSPITAL_COMMUNITY): Payer: Medicare Other

## 2015-03-13 ENCOUNTER — Encounter (HOSPITAL_COMMUNITY): Payer: Medicare Other

## 2015-03-15 ENCOUNTER — Encounter (HOSPITAL_COMMUNITY): Payer: Medicare Other

## 2015-03-19 ENCOUNTER — Telehealth: Payer: Self-pay | Admitting: Internal Medicine

## 2015-03-19 DIAGNOSIS — J849 Interstitial pulmonary disease, unspecified: Secondary | ICD-10-CM

## 2015-03-19 MED ORDER — PREDNISONE 10 MG PO TABS: ORAL_TABLET | ORAL | Status: DC

## 2015-03-19 MED ORDER — DOXYCYCLINE HYCLATE 100 MG PO TABS: 100.0000 mg | ORAL_TABLET | Freq: Two times a day (BID) | ORAL | Status: DC

## 2015-03-19 NOTE — Telephone Encounter (Signed)
Per Dr. Ashok Cordia:  Doxy 133m BID x 10 days. Take with full glass of water, stay upright for an hour after taking, no dairy products 1 hour after taking.  Prednisone 142m take 4 tabs x 3 days, then 3 tabs x 3 days, then 2 tabs x 3 days, then 1 tab daily and continue at baseline dose.  Provide sputum in AM (AFB, fungus, and bacteria).  If signs or symptoms worsen then go to ED.   Called and spoke to pt. Informed her of the recs per JN. Rx sent to preferred pharmacy. Orders placed for sputum culture, pt aware to come and pick up sputum cup in morning. Pt verbalized understanding and denied any further questions or concerns at this time.

## 2015-03-19 NOTE — Telephone Encounter (Signed)
Spoke with pt. Reports increased wheezing, coughing with production of yellow mucus and SOB. Has been taking Mucinex with minimal relief. Denies fever, chest tightness. Onset was 2 weeks ago. Would like MR's recommendations.  MR - please advise. Thanks.

## 2015-03-20 ENCOUNTER — Encounter (HOSPITAL_COMMUNITY): Payer: Medicare Other

## 2015-03-21 ENCOUNTER — Other Ambulatory Visit: Payer: Medicare Other

## 2015-03-21 DIAGNOSIS — J849 Interstitial pulmonary disease, unspecified: Secondary | ICD-10-CM | POA: Diagnosis not present

## 2015-03-22 ENCOUNTER — Other Ambulatory Visit: Payer: Self-pay

## 2015-03-22 ENCOUNTER — Encounter (HOSPITAL_COMMUNITY): Payer: Medicare Other

## 2015-03-22 DIAGNOSIS — M47816 Spondylosis without myelopathy or radiculopathy, lumbar region: Secondary | ICD-10-CM

## 2015-03-22 DIAGNOSIS — M069 Rheumatoid arthritis, unspecified: Secondary | ICD-10-CM

## 2015-03-22 MED ORDER — DICLOFENAC SODIUM 1 % TD GEL: 1.0000 "application " | Freq: Three times a day (TID) | TRANSDERMAL | Status: DC

## 2015-03-23 ENCOUNTER — Other Ambulatory Visit: Payer: Self-pay | Admitting: Registered Nurse

## 2015-03-23 ENCOUNTER — Encounter: Payer: Medicare Other | Attending: Physical Medicine & Rehabilitation | Admitting: Registered Nurse

## 2015-03-23 ENCOUNTER — Encounter: Payer: Self-pay | Admitting: Registered Nurse

## 2015-03-23 VITALS — BP 117/79 | HR 101

## 2015-03-23 DIAGNOSIS — Z5181 Encounter for therapeutic drug level monitoring: Secondary | ICD-10-CM

## 2015-03-23 DIAGNOSIS — I200 Unstable angina: Secondary | ICD-10-CM | POA: Diagnosis not present

## 2015-03-23 DIAGNOSIS — M7062 Trochanteric bursitis, left hip: Secondary | ICD-10-CM

## 2015-03-23 DIAGNOSIS — M4806 Spinal stenosis, lumbar region: Secondary | ICD-10-CM | POA: Diagnosis not present

## 2015-03-23 DIAGNOSIS — G894 Chronic pain syndrome: Secondary | ICD-10-CM

## 2015-03-23 DIAGNOSIS — M47816 Spondylosis without myelopathy or radiculopathy, lumbar region: Secondary | ICD-10-CM

## 2015-03-23 DIAGNOSIS — R5382 Chronic fatigue, unspecified: Secondary | ICD-10-CM

## 2015-03-23 DIAGNOSIS — M069 Rheumatoid arthritis, unspecified: Secondary | ICD-10-CM

## 2015-03-23 DIAGNOSIS — M1712 Unilateral primary osteoarthritis, left knee: Secondary | ICD-10-CM

## 2015-03-23 DIAGNOSIS — M48061 Spinal stenosis, lumbar region without neurogenic claudication: Secondary | ICD-10-CM

## 2015-03-23 DIAGNOSIS — M1711 Unilateral primary osteoarthritis, right knee: Secondary | ICD-10-CM

## 2015-03-23 DIAGNOSIS — M5416 Radiculopathy, lumbar region: Secondary | ICD-10-CM | POA: Insufficient documentation

## 2015-03-23 DIAGNOSIS — E079 Disorder of thyroid, unspecified: Secondary | ICD-10-CM

## 2015-03-23 DIAGNOSIS — Z79899 Other long term (current) drug therapy: Secondary | ICD-10-CM

## 2015-03-23 DIAGNOSIS — E1140 Type 2 diabetes mellitus with diabetic neuropathy, unspecified: Secondary | ICD-10-CM

## 2015-03-23 LAB — RESPIRATORY CULTURE OR RESPIRATORY AND SPUTUM CULTURE
Culture: NORMAL
ORGANISM ID, BACTERIA: NORMAL

## 2015-03-23 MED ORDER — MORPHINE SULFATE ER 30 MG PO TBCR: 30.0000 mg | EXTENDED_RELEASE_TABLET | Freq: Two times a day (BID) | ORAL | Status: DC

## 2015-03-23 NOTE — Progress Notes (Signed)
Subjective:    Patient ID: [name], female    DOB: August 25, 1940, 74 y.o.   MRN: [id number]  HPI: [name] is a 74 year old female who returns for follow up appointment and medication refill. She says her pain is located in her lower back radiating into her left hip and left lower extremity laterally. She did not rate her pain. Her current exercise regime is walking in her home with cane or walker and performing chair exercises. Husband in room.  Pain Inventory Average Pain 5 Pain Right Now NA My pain is sharp, burning and stabbing  In the last 24 hours, has pain interfered with the following? General activity 7 Relation with others 4 Enjoyment of life 7 What TIME of day is your pain at its worst? morning Sleep (in general) Fair  Pain is worse with: walking, bending, standing and some activites Pain improves with: rest, heat/ice, pacing activities and medication Relief from Meds: 6  Mobility walk with assistance use a cane use a walker how many minutes can you walk? 1-2 ability to climb steps?  no do you drive?  no use a wheelchair Do you have any goals in this area?  yes  Function retired I need assistance with the following:  meal prep, household duties and shopping Do you have any goals in this area?  yes  Neuro/Psych bladder control problems weakness numbness tingling trouble walking spasms  Prior Studies Any changes since last visit?  no  Physicians involved in your care Any changes since last visit?  yes   Family History  Problem Relation Age of Onset  . Diabetes Mother   . Heart attack Mother   . Hypertension Father   . Lung cancer Father   . Diabetes Sister   . Diabetes Brother   . Hypertension Brother   . Kidney cancer Brother   . Uterine cancer Daughter   . Breast cancer Sister   . Rheum arthritis Maternal Uncle   . Gout Brother   . Kidney failure Brother     x 5  . Heart disease Brother   . Heart attack Brother     Social History   Social History  . Marital Status: Married    Spouse Name: N/A  . Number of Children: 6  . Years of Education: college   Occupational History  . retired Marine scientist    Social History Main Topics  . Smoking status: Never Smoker   . Smokeless tobacco: Never Used  . Alcohol Use: No  . Drug Use: No  . Sexual Activity: Not Currently   Other Topics Concern  . None   Social History Narrative   Patient consumes 2-3 cups coffee per day.   Past Surgical History  Procedure Laterality Date  . Abdominal hysterectomy    . Ovary surgery    . Shoulder surgery Left   . Brain surgery      Gamma knife 10/13. Needs repeat spring  '14  . Video bronchoscopy Bilateral 05/31/2013    Procedure: VIDEO BRONCHOSCOPY WITHOUT FLUORO;  Surgeon: Brand Males, MD;  Location: [place];  Service: Cardiopulmonary;  Laterality: Bilateral;  . Video bronscoscopy  2000    lung  . Tonsillectomy  age 23  . Esophagogastroduodenoscopy (egd) with propofol N/A 09/14/2014    Procedure: ESOPHAGOGASTRODUODENOSCOPY (EGD) WITH PROPOFOL;  Surgeon: [name], MD;  Location: [facility];  Service: Endoscopy;  Laterality: N/A;  . Cardiac catheterization N/A 10/31/2014    Procedure: Right/Left Heart  Cath and Coronary Angiography;  Surgeon: [name], MD;  Location: [place] [facility];  Service: Cardiovascular;  Laterality: N/A;   Past Medical History  Diagnosis Date  . Diabetes mellitus   . Hypertension   . Thyroid disease   . Arthritis     endstage changes bilateral knees/bilateral ankles.   . High cholesterol   . 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 (HCC) 02/17/2012    Continue diplopia, left eye pain and left headaches.    . Depression, reactive   . RA (rheumatoid arthritis) ([place])     has been off methotreaxte since 10/13.  . Contusion of left knee     due to fall 1/14.  [name] URI (upper respiratory infection)   . Clotting disorder ([place])     pt denies this   . Asthma   . Carotid artery occlusion   . Interstitial lung disease ([place])   . Morbid obesity ([place])   . Shortness of breath   . Family history of heart disease   . Chronic kidney disease   . COPD (chronic obstructive pulmonary disease) (HCC)     pulmonary fibrosis  . Normal coronary arteries     cardiac catheterization performed  10/31/14  . Diastolic dysfunction    BP 117/79 mmHg  Pulse 101  SpO2 99%  Opioid Risk Score:   Fall Risk Score:  `1  Depression screen PHQ 2/9  Depression screen [facility] 2/9 01/05/2015 09/20/2014 08/10/2013  Decreased Interest 2 3 -  Down, Depressed, Hopeless _0 PHQ - 2 Score _1 Altered sleeping 2 0 -  Tired, decreased energy 2 3 -  Change in appetite 2 3 -  Feeling bad or failure about yourself  0 3 -  Trouble concentrating 1 1 -  Moving slowly or fidgety/restless 0 2 -  Suicidal thoughts 0 0 -  PHQ-9 Score 10 16 -  Difficult doing work/chores Somewhat difficult - -     Review of Systems  Constitutional: Positive for unexpected weight change.  Respiratory: Positive for cough, shortness of breath and wheezing.   Cardiovascular: Positive for leg swelling.  Musculoskeletal:       Spasms  Neurological: Positive for weakness and numbness.       Tingling Gait Instability  All other systems reviewed and are negative.      Objective:   Physical Exam  Constitutional: She is oriented to person, place, and time. She appears well-developed and well-nourished.  HENT:  Head: Normocephalic and atraumatic.  Neck: Normal range of motion. Neck supple.  Cardiovascular: Normal rate and regular rhythm.   Pulmonary/Chest: Effort normal. She has wheezes.  Continuous Oxygen at 3 Liters nasal cannula Coarse Rhonchi  Musculoskeletal:  Normal Muscle Bulk and Muscle Testing Reveals: Upper Extremities: Full ROM and Muscle Strength 5/5 Right Rhomboid Tenderness Thoracic Paraspinal Tenderness: T-10 - T-12 Lumbar Paraspinal Tenderness: L-3- L-5 Lower  Extremities: Full ROM and Muscle Strength 5/5 Anasarca to Lower Extremities Arises from chair slowly/ using walker for support Transfer to wheelchair  Neurological: She is alert and oriented to person, place, and time.  Skin: Skin is warm and dry.  Psychiatric: She has a normal mood and affect.  Nursing note and vitals reviewed.         Assessment & Plan:  1. Lumbar spinal stenosis with neurogenic claudication. Associated facet arthropathy: Continue Gabapentin and HEP as tolerated. Refilled: MS Contin 30 mg one tablet every 12 hours #60. Continue MSIR 15  mg 1/2 to one tablet every 6 hours as needed. No script given she has 91 tablets.  2. Depression: Continue Cymbalta 3. Diabetes mellitus type 2 with polyneuropathy: Continue Gabapentin 4. Rheumatoid arthritis and osteoarthritis. Contnue Voltaren Gel 5. Interstitial lung disease: Continuous Oxygen and Pulmonology Following.     30 minutes of face to face patient care time was spent during this visit. All questions were encouraged and answered.  F/U in 1 month

## 2015-03-24 LAB — PMP ALCOHOL METABOLITE (ETG)

## 2015-03-27 ENCOUNTER — Encounter (HOSPITAL_COMMUNITY): Payer: Medicare Other

## 2015-03-28 ENCOUNTER — Telehealth: Payer: Self-pay | Admitting: Internal Medicine

## 2015-03-28 NOTE — Telephone Encounter (Signed)
Patient calling to get results of Sputum culture.  Sample was collected on 03/21/15, do not see that all of the results have been received.  Advised patient that we have not received all of the results and that it takes some time to get cultures back so they can grow.  Patient advised that we will call her as soon as we receive the results.  Patient wanted [name] to know that she is still coughing up a lot of mucus.

## 2015-03-29 ENCOUNTER — Encounter (HOSPITAL_COMMUNITY): Payer: Medicare Other

## 2015-03-29 LAB — OPIATES/OPIOIDS (LC/MS-MS)
CODEINE URINE: NEGATIVE ng/mL (ref <50)
HYDROCODONE: NEGATIVE ng/mL (ref <50)
Hydromorphone: 134 ng/mL (ref <50)
Morphine Urine: 19267 ng/mL (ref <50)
NORHYDROCODONE, UR: NEGATIVE ng/mL (ref <50)
Noroxycodone, Ur: NEGATIVE ng/mL (ref <50)
Oxycodone, ur: NEGATIVE ng/mL (ref <50)
Oxymorphone: NEGATIVE ng/mL (ref <50)

## 2015-03-29 LAB — ETHYL GLUCURONIDE, URINE
ETGU: 703 ng/mL — AB (ref <500)
ETHYL SULFATE (ETS): 598 ng/mL — AB (ref <100)

## 2015-03-30 LAB — PRESCRIPTION MONITORING PROFILE (SOLSTAS)
Amphetamine/Meth: NEGATIVE ng/mL
BUPRENORPHINE, URINE: NEGATIVE ng/mL
Barbiturate Screen, Urine: NEGATIVE ng/mL
Benzodiazepine Screen, Urine: NEGATIVE ng/mL
CREATININE, URINE: 85.69 mg/dL (ref >20.0)
Cannabinoid Scrn, Ur: NEGATIVE ng/mL
Carisoprodol, Urine: NEGATIVE ng/mL
Cocaine Metabolites: NEGATIVE ng/mL
Fentanyl, Ur: NEGATIVE ng/mL
MDMA URINE: NEGATIVE ng/mL
MEPERIDINE UR: NEGATIVE ng/mL
METHADONE SCREEN, URINE: NEGATIVE ng/mL
NITRITES URINE, INITIAL: NEGATIVE ug/mL
OXYCODONE SCRN UR: NEGATIVE ng/mL
PH URINE, INITIAL: 5.1 pH (ref 4.5–8.9)
Propoxyphene: NEGATIVE ng/mL
Tapentadol, urine: NEGATIVE ng/mL
Tramadol Scrn, Ur: NEGATIVE ng/mL
Zolpidem, Urine: NEGATIVE ng/mL

## 2015-03-30 NOTE — Telephone Encounter (Signed)
Culture has not been resulted yet Will call pt once results are in

## 2015-04-02 ENCOUNTER — Telehealth: Payer: Self-pay | Admitting: Internal Medicine

## 2015-04-02 MED ORDER — PREDNISONE 10 MG PO TABS: ORAL_TABLET | ORAL | Status: DC

## 2015-04-02 MED ORDER — LEVOFLOXACIN 500 MG PO TABS: 500.0000 mg | ORAL_TABLET | Freq: Every day | ORAL | Status: DC

## 2015-04-02 NOTE — Telephone Encounter (Signed)
Not sure whaty is going on   Plan Sputum culture 03/21/15 is negative  I have explained toher that her problems will be increasingly difficult to Rx  Can try 2nd course abx/pred   take levaquin 524m once daily  X 6 days  Take prednisone 40 mg daily x 2 days, then 234mdaily x 2 days, then 1072maily x 2 days, then 5mg69mily x 2 days and stop or continue baseline dose as case maybe  If worse, ER    Allergies  Allergen Reactions  . Codeine Swelling    Facial swelling  . Remicade [Infliximab] Anaphylaxis    "sent me into shock"  . Zestril [Lisinopril] Swelling    Face and neck swelling    Current outpatient prescriptions:  .  ACCU-CHEK SMARTVIEW test strip, 1 each by Other route every morning. , Disp: , Rfl: 1 .  acetaminophen (TYLENOL) 500 MG tablet, Take 500-1,000 mg by mouth 2 (two) times daily as needed for pain. Takes with tramadol, Disp: , Rfl:  .  albuterol (PROVENTIL HFA;VENTOLIN HFA) 108 (90 BASE) MCG/ACT inhaler, Inhale 2 puffs into the lungs every 6 (six) hours as needed for wheezing or shortness of breath., Disp: , Rfl:  .  allopurinol (ZYLOPRIM) 100 MG tablet, Take 2 tablets (200 mg total) by mouth daily., Disp: 60 tablet, Rfl: 4 .  aspirin EC 81 MG tablet, Take 81 mg by mouth every morning. , Disp: , Rfl:  .  atorvastatin (LIPITOR) 20 MG tablet, Take 10 mg by mouth every evening. , Disp: , Rfl:  .  BENICAR HCT 40-25 MG per tablet, Take 0.5 tablets by mouth every morning. , Disp: , Rfl: 0 .  Calcium Carb-Cholecalciferol (CALCIUM + D3 PO), Take 2 tablets by mouth daily with lunch. , Disp: , Rfl:  .  cholecalciferol (VITAMIN D) 1000 UNITS tablet, Take 2,000 Units by mouth daily with lunch. , Disp: , Rfl:  .  colchicine 0.6 MG tablet, Take 0.6 mg by mouth daily as needed (GOUT)., Disp: , Rfl:  .  diclofenac sodium (VOLTAREN) 1 % GEL, Apply 1 application topically 3 (three) times daily., Disp: 3 Tube, Rfl: 4 .  doxycycline (VIBRA-TABS) 100 MG tablet, Take 1 tablet (100 mg  total) by mouth 2 (two) times daily., Disp: 20 tablet, Rfl: 0 .  DULoxetine (CYMBALTA) 60 MG capsule, Take 1 capsule (60 mg total) by mouth daily., Disp: 30 capsule, Rfl: 4 .  famotidine (PEPCID) 20 MG tablet, Take 1 tablet (20 mg total) by mouth at bedtime., Disp: 30 tablet, Rfl: 11 .  fluticasone (FLONASE) 50 MCG/ACT nasal spray, Place 2 sprays into both nostrils daily., Disp: , Rfl:  .  furosemide (LASIX) 40 MG tablet, Take 1 tablet (40 mg total) by mouth daily., Disp: 90 tablet, Rfl: 3 .  gabapentin (NEURONTIN) 600 MG tablet, Take 1 tablet (600 mg total) by mouth as directed. Take 2 tablet in the morning, 2 tablet in the middle of the day, and 3 tablets at night, Disp: 210 tablet, Rfl: 1 .  guaiFENesin (MUCINEX) 600 MG 12 hr tablet, Take 1,200 mg by mouth 2 (two) times daily., Disp: , Rfl:  .  levothyroxine (SYNTHROID, LEVOTHROID) 50 MCG tablet, Take 1 tablet (50 mcg total) by mouth daily., Disp: 30 tablet, Rfl: 1 .  metoprolol succinate (TOPROL-XL) 100 MG 24 hr tablet, Take 100 mg by mouth every morning. Take with or immediately following a meal., Disp: , Rfl:  .  morphine (MS CONTIN) 30 MG 12  hr tablet, Take 1 tablet (30 mg total) by mouth every 12 (twelve) hours., Disp: 60 tablet, Rfl: 0 .  morphine (MSIR) 15 MG tablet, TAKE 1/2 TO 1 TABLET BY MOUTH EVERY 6 HOURS AS NEEDED FOR MODERATE PAIN, Disp: , Rfl: 0 .  Multiple Vitamin (MULTIVITAMIN WITH MINERALS) TABS, Take 1 tablet by mouth daily with lunch. , Disp: , Rfl:  .  OXYGEN-HELIUM IN, Inhale 2 L into the lungs at bedtime. And on exertion, Disp: , Rfl:  .  pantoprazole (PROTONIX) 40 MG tablet, TAKE 1 TABLET BY MOUTH DAILY, MINUTES BEFORE IST MEAL  OF THE DAY, Disp: 30 tablet, Rfl: 5 .  predniSONE (DELTASONE) 10 MG tablet, Take 2 tablets (20 mg total) by mouth daily with breakfast. (Patient taking differently: Take 10 mg by mouth daily with breakfast. ), Disp: 60 tablet, Rfl: 5 .  predniSONE (DELTASONE) 10 MG tablet, Take 53m for 3 days, then  332mfor 3 days, 2030mor 3 days, 18m21md continue at baseline dose, Disp: 27 tablet, Rfl: 0 .  sodium chloride (OCEAN) 0.65 % SOLN nasal spray, Place 1 spray into both nostrils as needed for congestion., Disp: , Rfl:  .  Spacer/Aero-Holding Chambers (AEROCHAMBER MV) inhaler, Use as instructed, Disp: 1 each, Rfl: 0 .  VICTOZA 18 MG/3ML SOPN, Inject 1.8 mg as directed daily. , Disp: , Rfl: 0

## 2015-04-02 NOTE — Telephone Encounter (Signed)
Patient notified of Dr. Golden Pop recommendations. Rx sent to pharmacy. Nothing further needed. Closing encounter

## 2015-04-02 NOTE — Telephone Encounter (Signed)
Called and spoke to pt. Pt states she does not feel any improvement since abx and pred that she started taking on 03/19/15. Pt stated the only improvement is the color of her mucus. Pt stated she is still having a prod cough with yellow mucus but it is not as yellow/green as it was before. Pt also c/o fatigue and chest tightness. Pt denies worsening in SOB. Pt is requesting recs.   [name], please advise. Thanks.

## 2015-04-03 ENCOUNTER — Encounter (HOSPITAL_COMMUNITY): Payer: Medicare Other

## 2015-04-04 ENCOUNTER — Telehealth: Payer: Self-pay

## 2015-04-04 NOTE — Telephone Encounter (Signed)
Culture results still not back yet. Awaiting results.

## 2015-04-04 NOTE — Telephone Encounter (Signed)
Patient is positive for Alcohol in UDS.

## 2015-04-05 ENCOUNTER — Encounter (HOSPITAL_COMMUNITY): Payer: Medicare Other

## 2015-04-06 NOTE — Telephone Encounter (Signed)
Pt is requesting sputum results from 03/21/15. Please advise MR thanks

## 2015-04-06 NOTE — Telephone Encounter (Signed)
Normal flora. Is sputum culture. Please let her know that if her lung problems persist I would like her to have a 2nd opinion at [facility]

## 2015-04-06 NOTE — Telephone Encounter (Signed)
Left message to call back  

## 2015-04-06 NOTE — Telephone Encounter (Signed)
Patient returned call, can be reached at [phone].

## 2015-04-06 NOTE — Telephone Encounter (Signed)
Results have been explained to patient along with rec's per MR, pt expressed understanding. Nothing further needed.

## 2015-04-10 ENCOUNTER — Encounter (HOSPITAL_COMMUNITY): Payer: Medicare Other

## 2015-04-10 DIAGNOSIS — Z09 Encounter for follow-up examination after completed treatment for conditions other than malignant neoplasm: Secondary | ICD-10-CM | POA: Diagnosis not present

## 2015-04-10 DIAGNOSIS — R5381 Other malaise: Secondary | ICD-10-CM | POA: Diagnosis not present

## 2015-04-10 DIAGNOSIS — M1A00X0 Idiopathic chronic gout, unspecified site, without tophus (tophi): Secondary | ICD-10-CM | POA: Diagnosis not present

## 2015-04-10 DIAGNOSIS — M0579 Rheumatoid arthritis with rheumatoid factor of multiple sites without organ or systems involvement: Secondary | ICD-10-CM | POA: Diagnosis not present

## 2015-04-10 NOTE — Telephone Encounter (Signed)
Please review with patient. Prior uds ok. Ask her if she has been drinking. My index of suspicion is quite low with her.

## 2015-04-10 NOTE — Telephone Encounter (Signed)
Spoke with pt. Pt. States that she hasn't had a drink in over 30 years. She wanted to know if this could be a by product of being on several rounds of steroids and antibiotics from her pulmonologist. She seemed very shocked and even asked me if I had the right patient.

## 2015-04-10 NOTE — Telephone Encounter (Signed)
Please advise 

## 2015-04-10 NOTE — Telephone Encounter (Signed)
This type of result makes me question the lab.  As i said my index of suspicion is quite low with her. It might be worthwhile to discuss with the lab how this might have been positive in her case.

## 2015-04-12 ENCOUNTER — Encounter (HOSPITAL_COMMUNITY): Payer: Medicare Other

## 2015-04-16 ENCOUNTER — Ambulatory Visit (INDEPENDENT_AMBULATORY_CARE_PROVIDER_SITE_OTHER): Payer: Medicare Other | Admitting: Internal Medicine

## 2015-04-16 ENCOUNTER — Encounter: Payer: Self-pay | Admitting: Internal Medicine

## 2015-04-16 VITALS — BP 118/72 | HR 114 | Ht 64.0 in | Wt 292.0 lb

## 2015-04-16 DIAGNOSIS — J9611 Chronic respiratory failure with hypoxia: Secondary | ICD-10-CM

## 2015-04-16 DIAGNOSIS — J849 Interstitial pulmonary disease, unspecified: Secondary | ICD-10-CM | POA: Diagnosis not present

## 2015-04-16 DIAGNOSIS — I200 Unstable angina: Secondary | ICD-10-CM | POA: Diagnosis not present

## 2015-04-16 LAB — FUNGUS CULTURE W SMEAR: SMEAR RESULT: NONE SEEN

## 2015-04-16 NOTE — Telephone Encounter (Signed)
Johnette could you follow up with the lab about this? I will lay UDS on your desk. Thanks.

## 2015-04-16 NOTE — Patient Instructions (Signed)
ICD-9-CM ICD-10-CM   1. ILD (interstitial lung disease) ([place]) 515 J84.9   2. Chronic respiratory failure with hypoxia ([facility]) 518.83 J96.11    799.02     Glad you are better from recent flare up Continue oxygen Looks like you are unable to get off prednisone - will leave baseline dose decision to Dr Wyman Songster can consider immuran after discussion with Dr Estanislado Pandy  Refer [facility] for 2nd opinion Weight loss will be key to improving your quality of life  Followup  3 months or sooner if needed

## 2015-04-16 NOTE — Progress Notes (Signed)
Subjective:     Patient ID: [name], female   DOB: 1940-08-05, 74 y.o.   MRN: [id number]  HPI  OV 01/03/2015  Chief Complaint  Patient presents with  . Follow-up    Pt c/o DOE, resolving cough that started over the weekend, chest discomfort when SOB and when lying down. Pt c/o increasing fatigue.    She has severe class 3 dyspnea with hypoxemia on account of BMI 50, diaast dsyfn, chronic pain, deconditioning and ILD due to RA on celcept/pred/bactrim   I personally last saw her in march 2-016. Since then has seen other provicers in this office and most recently in June 2016. In June 2016 admitted for acute CHF - cath showed mild pulm htn only with PA mean 24 and minimal CAD (reviewed chart of procedures June 2016). Dx as acute diast chf. Since then better. SHe had fu HRCT 11/29/14 that shows progression in ILD since march 2016 and now with UIP pattern (personally visualized image). She is finally accepting of fact that ILD is not makin cause of dyspnea and obesity is main problem. She is wondering about dc cellcept/pred/bactrim so as to minimize polypharmacy and side effect profile    OV 02/09/2015  Chief Complaint  Patient presents with  . Follow-up    Pt states her breathing is doing well the past few days. Pt recently went to [facility] ED for CP. Pt c/o prod cough with tan mucus. Pt denies CP/tightness    She has severe class 3 dyspnea with hypoxemia on account of BMI 50, diaast dsyfn, chronic pain, deconditioning and ILD due to RA. She is having progressive ILD. But given her miserable quality of life and polypharmacy and other side effects we decided to stop the CellCept and Bactrim. We also advise prednisone taper. This was all in the end of August 2016. But on 01/27/2015 she ended up in the emergency room with some respiratory difficulty. Her prednisone was bumped up. She subsequently saw her rheumatologist [name] was now tapering her prednisone again. There is also some concerns of  headaches and possibly temporal arteritis based on her description and apparently some kind of a biopsy scheduled. She feels that since stopping her CellCept and Bactrim her cough is returned. Nevertheless weight and knee pain and back pain continued to be major issues for her along with polypharmacy. Her Polysorb was includes MS Contin  [name]    OV 02/21/2015  - a ACUTE VISIT  Chief Complaint  Patient presents with  . Acute Visit    Pt c/o of chest tightness, productive cough with white/yellow mucus, and wheezing. Pt also c/o of feeling abdominal swelling/distention. Pt also c/o of constant fatigue. Pt states symptoms have been present x1 week.     Acute visit for this morbidly obese, rheumatoid arthritis patient with ILD who is now off CellCept  I saw her as recently as 02/09/2015. She reports that for the past 1 week she's having increased chest congestion, chest tightness, wheezing, shortness of breath, cough and yellow  sputum more than baseline. Symptoms are rated as moderate in severity. Relieved by inhaler. She has tried Mucinex for 1 week with some initial partial relief but symptoms progressed. So she made an acute visit.  She continues to stay off CellCept. She is not on prednisone 10 mg per day. She tells me [name] has held her prednisone at the dose because of concerns of temporal arteritis but apparently now that his  diagnoses being eliminated in the differential diagnosis. She will see [name] in the next few weeks to decide on further prednisone taper.    OV 04/16/2015  Chief Complaint  Patient presents with  . Follow-up    Pt states she felt much better after completing the abx and pred. Pt states her SOB is at basline, prod cough with yellow mucus at times, pt c/o chest tightness.     Morbidly obese female with rheumatoid arthritis with interstitial lung disease UIP pattern with associated deconditioning and diastolic dysfunction  She  continues to remain off CellCept. Last seen in October 2016. At that time I discharged her from the office with doxycycline and prednisone taper. After that early November 2016 she was given another course of Doxy and prednisone burst. This did not help. Sputum culture at this time showed normal flora. She called again with the middle of November was given Levaquin and prednisone burst. She feels Levaquin helped her immensely. At this point in time she is reporting progressive dyspnea and also worsening arthritis. She wants to go back on immunomodulators again. She has discussed Imuran with [name]. She is due to see [name] tomorrow. She also feels that she is unable to come off prednisone. She feels at a minimum she needs 10 mg of prednisone per day. She will discuss this with Dr. D her rheumatologist tomorrow. She's also interested in a second opinion at [facility] interstitial lung disease clinic which I did discuss with her most recently. She continues to use oxygen.    Current outpatient prescriptions:  .  ACCU-CHEK SMARTVIEW test strip, 1 each by Other route every morning. , Disp: , Rfl: 1 .  acetaminophen (TYLENOL) 500 MG tablet, Take 500-1,000 mg by mouth 2 (two) times daily as needed for pain. Takes with tramadol, Disp: , Rfl:  .  albuterol (PROVENTIL HFA;VENTOLIN HFA) 108 (90 BASE) MCG/ACT inhaler, Inhale 2 puffs into the lungs every 6 (six) hours as needed for wheezing or shortness of breath., Disp: , Rfl:  .  allopurinol (ZYLOPRIM) 100 MG tablet, Take 2 tablets (200 mg total) by mouth daily., Disp: 60 tablet, Rfl: 4 .  aspirin EC 81 MG tablet, Take 81 mg by mouth every morning. , Disp: , Rfl:  .  atorvastatin (LIPITOR) 20 MG tablet, Take 10 mg by mouth every evening. , Disp: , Rfl:  .  BENICAR HCT 40-25 MG per tablet, Take 0.5 tablets by mouth every morning. , Disp: , Rfl: 0 .  Calcium Carb-Cholecalciferol (CALCIUM + D3 PO), Take 2 tablets by mouth daily with lunch. , Disp: , Rfl:  .  cholecalciferol  (VITAMIN D) 1000 UNITS tablet, Take 2,000 Units by mouth daily with lunch. , Disp: , Rfl:  .  colchicine 0.6 MG tablet, Take 0.6 mg by mouth daily as needed (GOUT)., Disp: , Rfl:  .  diclofenac sodium (VOLTAREN) 1 % GEL, Apply 1 application topically 3 (three) times daily., Disp: 3 Tube, Rfl: 4 .  DULoxetine (CYMBALTA) 60 MG capsule, Take 1 capsule (60 mg total) by mouth daily., Disp: 30 capsule, Rfl: 4 .  famotidine (PEPCID) 20 MG tablet, Take 1 tablet (20 mg total) by mouth at bedtime., Disp: 30 tablet, Rfl: 11 .  fluticasone (FLONASE) 50 MCG/ACT nasal spray, Place 2 sprays into both nostrils daily., Disp: , Rfl:  .  furosemide (LASIX) 40 MG tablet, Take 1 tablet (40 mg total) by mouth daily., Disp: 90 tablet, Rfl: 3 .  gabapentin (NEURONTIN) 600 MG  tablet, Take 1 tablet (600 mg total) by mouth as directed. Take 2 tablet in the morning, 2 tablet in the middle of the day, and 3 tablets at night, Disp: 210 tablet, Rfl: 1 .  guaiFENesin (MUCINEX) 600 MG 12 hr tablet, Take 1,200 mg by mouth 2 (two) times daily., Disp: , Rfl:  .  levothyroxine (SYNTHROID, LEVOTHROID) 50 MCG tablet, Take 1 tablet (50 mcg total) by mouth daily., Disp: 30 tablet, Rfl: 1 .  metoprolol succinate (TOPROL-XL) 100 MG 24 hr tablet, Take 100 mg by mouth every morning. Take with or immediately following a meal., Disp: , Rfl:  .  morphine (MS CONTIN) 30 MG 12 hr tablet, Take 1 tablet (30 mg total) by mouth every 12 (twelve) hours., Disp: 60 tablet, Rfl: 0 .  morphine (MSIR) 15 MG tablet, TAKE 1/2 TO 1 TABLET BY MOUTH EVERY 6 HOURS AS NEEDED FOR MODERATE PAIN, Disp: , Rfl: 0 .  Multiple Vitamin (MULTIVITAMIN WITH MINERALS) TABS, Take 1 tablet by mouth daily with lunch. , Disp: , Rfl:  .  OXYGEN-HELIUM IN, Inhale 2 L into the lungs at bedtime. And on exertion, Disp: , Rfl:  .  pantoprazole (PROTONIX) 40 MG tablet, TAKE 1 TABLET BY MOUTH DAILY, MINUTES BEFORE IST MEAL  OF THE DAY, Disp: 30 tablet, Rfl: 5 .  predniSONE (DELTASONE) 10  MG tablet, Take 2 tablets (20 mg total) by mouth daily with breakfast. (Patient taking differently: Take 5 mg by mouth daily with breakfast. ), Disp: 60 tablet, Rfl: 5 .  sodium chloride (OCEAN) 0.65 % SOLN nasal spray, Place 1 spray into both nostrils as needed for congestion., Disp: , Rfl:  .  Spacer/Aero-Holding Chambers (AEROCHAMBER MV) inhaler, Use as instructed, Disp: 1 each, Rfl: 0 .  VICTOZA 18 MG/3ML SOPN, Inject 1.8 mg as directed daily. , Disp: , Rfl: 0   Review of Systems Per HPI    Objective:   Physical Exam  Constitutional: She is oriented to person, place, and time. She appears well-developed and well-nourished. No distress.  Morbidly obesity on wheelchair Oxygen on  HENT:  Head: Normocephalic and atraumatic.  Right Ear: External ear normal.  Left Ear: External ear normal.  Mouth/Throat: Oropharynx is clear and moist. No oropharyngeal exudate.  Eyes: Conjunctivae and EOM are normal. Pupils are equal, round, and reactive to light. Right eye exhibits no discharge. Left eye exhibits no discharge. No scleral icterus.  Neck: Normal range of motion. Neck supple. No JVD present. No tracheal deviation present. No thyromegaly present.  Cardiovascular: Normal rate, regular rhythm, normal heart sounds and intact distal pulses.  Exam reveals no Mcdaniel and no friction rub.   No murmur heard. Pulmonary/Chest: Effort normal. No respiratory distress. She has no wheezes. She has rales. She exhibits no tenderness.  Abdominal: Soft. Bowel sounds are normal. She exhibits no distension and no mass. There is no tenderness. There is no rebound and no guarding.  Musculoskeletal: Normal range of motion. She exhibits no edema or tenderness.  Diffuse arthralgia  Lymphadenopathy:    She has no cervical adenopathy.  Neurological: She is alert and oriented to person, place, and time. She has normal reflexes. No cranial nerve deficit. She exhibits normal muscle tone. Coordination normal.  Skin: Skin is  warm and dry. No rash noted. She is not diaphoretic. No erythema. No pallor.  Psychiatric: She has a normal mood and affect. Her behavior is normal. Judgment and thought content normal.  Vitals reviewed.  Filed Vitals:   04/16/15 1117  BP: 118/72  Pulse: 114  Height: _0  (1.626 m)  Weight: 292 lb (132.45 kg)  SpO2: 93%  93% on 2Pulse at rest          Assessment:       ICD-9-CM ICD-10-CM   1. ILD (interstitial lung disease) ([place]) 515 J84.9   2. Chronic respiratory failure with hypoxia (HCC) 518.83 J96.11    799.02         Plan:      Glad you are better from recent flare up Continue oxygen Looks like you are unable to get off prednisone - will leave baseline dose decision to Dr Wyman Songster can consider immuran after discussion with Dr Estanislado Pandy  Refer [facility] for 2nd opinion Weight loss will be key to improving your quality of life  Followup  3 months or sooner if needed    Dr. Brand Males, M.D., [facility].C.P Pulmonary and Critical Care Medicine Staff Physician [facility] Pager: [phone], If no answer or between  15:00h - 7:00h: call [phone]  04/16/2015 11:40 AM

## 2015-04-17 ENCOUNTER — Encounter (HOSPITAL_COMMUNITY): Payer: Self-pay | Admitting: Emergency Medicine

## 2015-04-17 ENCOUNTER — Emergency Department (HOSPITAL_COMMUNITY): Payer: Medicare Other

## 2015-04-17 ENCOUNTER — Encounter (HOSPITAL_COMMUNITY): Payer: Medicare Other

## 2015-04-17 ENCOUNTER — Inpatient Hospital Stay (HOSPITAL_COMMUNITY)
Admission: EM | Admit: 2015-04-17 | Discharge: 2015-04-22 | DRG: 871 | Disposition: A | Discharge status: Skilled Nursing Facility | Payer: Medicare Other | Attending: Internal Medicine | Admitting: Internal Medicine

## 2015-04-17 DIAGNOSIS — Z79899 Other long term (current) drug therapy: Secondary | ICD-10-CM

## 2015-04-17 DIAGNOSIS — Z90710 Acquired absence of both cervix and uterus: Secondary | ICD-10-CM | POA: Diagnosis not present

## 2015-04-17 DIAGNOSIS — N183 Chronic kidney disease, stage 3 (moderate): Secondary | ICD-10-CM | POA: Diagnosis present

## 2015-04-17 DIAGNOSIS — N189 Chronic kidney disease, unspecified: Secondary | ICD-10-CM

## 2015-04-17 DIAGNOSIS — R1110 Vomiting, unspecified: Secondary | ICD-10-CM | POA: Diagnosis present

## 2015-04-17 DIAGNOSIS — E1122 Type 2 diabetes mellitus with diabetic chronic kidney disease: Secondary | ICD-10-CM | POA: Diagnosis present

## 2015-04-17 DIAGNOSIS — I5032 Chronic diastolic (congestive) heart failure: Secondary | ICD-10-CM | POA: Diagnosis present

## 2015-04-17 DIAGNOSIS — R278 Other lack of coordination: Secondary | ICD-10-CM | POA: Diagnosis not present

## 2015-04-17 DIAGNOSIS — I5033 Acute on chronic diastolic (congestive) heart failure: Secondary | ICD-10-CM | POA: Diagnosis not present

## 2015-04-17 DIAGNOSIS — E662 Morbid (severe) obesity with alveolar hypoventilation: Secondary | ICD-10-CM | POA: Diagnosis present

## 2015-04-17 DIAGNOSIS — M47816 Spondylosis without myelopathy or radiculopathy, lumbar region: Secondary | ICD-10-CM

## 2015-04-17 DIAGNOSIS — E039 Hypothyroidism, unspecified: Secondary | ICD-10-CM | POA: Diagnosis present

## 2015-04-17 DIAGNOSIS — M7989 Other specified soft tissue disorders: Secondary | ICD-10-CM | POA: Diagnosis not present

## 2015-04-17 DIAGNOSIS — Z9981 Dependence on supplemental oxygen: Secondary | ICD-10-CM

## 2015-04-17 DIAGNOSIS — J440 Chronic obstructive pulmonary disease with acute lower respiratory infection: Secondary | ICD-10-CM | POA: Diagnosis present

## 2015-04-17 DIAGNOSIS — R600 Localized edema: Secondary | ICD-10-CM | POA: Diagnosis present

## 2015-04-17 DIAGNOSIS — M79609 Pain in unspecified limb: Secondary | ICD-10-CM | POA: Diagnosis not present

## 2015-04-17 DIAGNOSIS — M7062 Trochanteric bursitis, left hip: Secondary | ICD-10-CM

## 2015-04-17 DIAGNOSIS — M5416 Radiculopathy, lumbar region: Secondary | ICD-10-CM

## 2015-04-17 DIAGNOSIS — J9621 Acute and chronic respiratory failure with hypoxia: Secondary | ICD-10-CM | POA: Diagnosis not present

## 2015-04-17 DIAGNOSIS — E785 Hyperlipidemia, unspecified: Secondary | ICD-10-CM | POA: Diagnosis present

## 2015-04-17 DIAGNOSIS — I959 Hypotension, unspecified: Secondary | ICD-10-CM | POA: Diagnosis not present

## 2015-04-17 DIAGNOSIS — M6281 Muscle weakness (generalized): Secondary | ICD-10-CM | POA: Diagnosis not present

## 2015-04-17 DIAGNOSIS — M069 Rheumatoid arthritis, unspecified: Secondary | ICD-10-CM | POA: Diagnosis present

## 2015-04-17 DIAGNOSIS — Z9181 History of falling: Secondary | ICD-10-CM | POA: Diagnosis not present

## 2015-04-17 DIAGNOSIS — Z888 Allergy status to other drugs, medicaments and biological substances status: Secondary | ICD-10-CM | POA: Diagnosis not present

## 2015-04-17 DIAGNOSIS — E2740 Unspecified adrenocortical insufficiency: Secondary | ICD-10-CM | POA: Diagnosis present

## 2015-04-17 DIAGNOSIS — K5900 Constipation, unspecified: Secondary | ICD-10-CM | POA: Diagnosis present

## 2015-04-17 DIAGNOSIS — R0600 Dyspnea, unspecified: Secondary | ICD-10-CM | POA: Diagnosis not present

## 2015-04-17 DIAGNOSIS — R0602 Shortness of breath: Secondary | ICD-10-CM | POA: Diagnosis not present

## 2015-04-17 DIAGNOSIS — A419 Sepsis, unspecified organism: Principal | ICD-10-CM | POA: Diagnosis present

## 2015-04-17 DIAGNOSIS — R2681 Unsteadiness on feet: Secondary | ICD-10-CM | POA: Diagnosis not present

## 2015-04-17 DIAGNOSIS — Z6841 Body Mass Index (BMI) 40.0 and over, adult: Secondary | ICD-10-CM

## 2015-04-17 DIAGNOSIS — R0902 Hypoxemia: Secondary | ICD-10-CM | POA: Diagnosis not present

## 2015-04-17 DIAGNOSIS — J45909 Unspecified asthma, uncomplicated: Secondary | ICD-10-CM | POA: Diagnosis present

## 2015-04-17 DIAGNOSIS — I129 Hypertensive chronic kidney disease with stage 1 through stage 4 chronic kidney disease, or unspecified chronic kidney disease: Secondary | ICD-10-CM | POA: Diagnosis present

## 2015-04-17 DIAGNOSIS — J189 Pneumonia, unspecified organism: Secondary | ICD-10-CM | POA: Diagnosis present

## 2015-04-17 DIAGNOSIS — E1140 Type 2 diabetes mellitus with diabetic neuropathy, unspecified: Secondary | ICD-10-CM | POA: Diagnosis present

## 2015-04-17 DIAGNOSIS — M1711 Unilateral primary osteoarthritis, right knee: Secondary | ICD-10-CM

## 2015-04-17 DIAGNOSIS — N179 Acute kidney failure, unspecified: Secondary | ICD-10-CM

## 2015-04-17 DIAGNOSIS — E079 Disorder of thyroid, unspecified: Secondary | ICD-10-CM

## 2015-04-17 DIAGNOSIS — I10 Essential (primary) hypertension: Secondary | ICD-10-CM | POA: Diagnosis present

## 2015-04-17 DIAGNOSIS — Z7952 Long term (current) use of systemic steroids: Secondary | ICD-10-CM

## 2015-04-17 DIAGNOSIS — Z7982 Long term (current) use of aspirin: Secondary | ICD-10-CM

## 2015-04-17 DIAGNOSIS — Z79891 Long term (current) use of opiate analgesic: Secondary | ICD-10-CM

## 2015-04-17 DIAGNOSIS — R5383 Other fatigue: Secondary | ICD-10-CM | POA: Diagnosis not present

## 2015-04-17 DIAGNOSIS — J9611 Chronic respiratory failure with hypoxia: Secondary | ICD-10-CM | POA: Diagnosis not present

## 2015-04-17 DIAGNOSIS — R05 Cough: Secondary | ICD-10-CM | POA: Diagnosis not present

## 2015-04-17 DIAGNOSIS — M48061 Spinal stenosis, lumbar region without neurogenic claudication: Secondary | ICD-10-CM

## 2015-04-17 DIAGNOSIS — R609 Edema, unspecified: Secondary | ICD-10-CM

## 2015-04-17 DIAGNOSIS — I9589 Other hypotension: Secondary | ICD-10-CM | POA: Diagnosis not present

## 2015-04-17 DIAGNOSIS — R5382 Chronic fatigue, unspecified: Secondary | ICD-10-CM

## 2015-04-17 DIAGNOSIS — R509 Fever, unspecified: Secondary | ICD-10-CM | POA: Diagnosis not present

## 2015-04-17 DIAGNOSIS — R1312 Dysphagia, oropharyngeal phase: Secondary | ICD-10-CM | POA: Diagnosis not present

## 2015-04-17 DIAGNOSIS — R404 Transient alteration of awareness: Secondary | ICD-10-CM | POA: Diagnosis not present

## 2015-04-17 DIAGNOSIS — J810 Acute pulmonary edema: Secondary | ICD-10-CM | POA: Diagnosis not present

## 2015-04-17 DIAGNOSIS — Z885 Allergy status to narcotic agent status: Secondary | ICD-10-CM

## 2015-04-17 DIAGNOSIS — J849 Interstitial pulmonary disease, unspecified: Secondary | ICD-10-CM | POA: Diagnosis not present

## 2015-04-17 DIAGNOSIS — J188 Other pneumonia, unspecified organism: Secondary | ICD-10-CM | POA: Diagnosis not present

## 2015-04-17 DIAGNOSIS — R531 Weakness: Secondary | ICD-10-CM | POA: Diagnosis not present

## 2015-04-17 HISTORY — DX: Chronic kidney disease, unspecified: N17.9

## 2015-04-17 LAB — I-STAT TROPONIN, ED: Troponin i, poc: 0.01 ng/mL (ref 0.00–0.08)

## 2015-04-17 LAB — CBC
HCT: 39.3 % (ref 36.0–46.0)
HEMOGLOBIN: 11.9 g/dL — AB (ref 12.0–15.0)
MCH: 29.6 pg (ref 26.0–34.0)
MCHC: 30.3 g/dL (ref 30.0–36.0)
MCV: 97.8 fL (ref 78.0–100.0)
Platelets: 154 10*3/uL (ref 150–400)
RBC: 4.02 MIL/uL (ref 3.87–5.11)
RDW: 15.9 % — ABNORMAL HIGH (ref 11.5–15.5)
WBC: 9.3 10*3/uL (ref 4.0–10.5)

## 2015-04-17 LAB — I-STAT ARTERIAL BLOOD GAS, ED
Acid-Base Excess: 5 mmol/L — ABNORMAL HIGH (ref 0.0–2.0)
Bicarbonate: 30.4 mEq/L — ABNORMAL HIGH (ref 20.0–24.0)
O2 Saturation: 96 %
PCO2 ART: 48.4 mmHg — AB (ref 35.0–45.0)
PH ART: 7.405 (ref 7.350–7.450)
TCO2: 32 mmol/L (ref 0–100)
pO2, Arterial: 79 mmHg — ABNORMAL LOW (ref 80.0–100.0)

## 2015-04-17 LAB — COMPREHENSIVE METABOLIC PANEL
ALK PHOS: 51 U/L (ref 38–126)
ALT: 28 U/L (ref 14–54)
ANION GAP: 8 (ref 5–15)
AST: 17 U/L (ref 15–41)
Albumin: 3.3 g/dL — ABNORMAL LOW (ref 3.5–5.0)
BILIRUBIN TOTAL: 0.8 mg/dL (ref 0.3–1.2)
BUN: 25 mg/dL — ABNORMAL HIGH (ref 6–20)
CALCIUM: 8.8 mg/dL — AB (ref 8.9–10.3)
CO2: 32 mmol/L (ref 22–32)
Chloride: 101 mmol/L (ref 101–111)
Creatinine, Ser: 2.84 mg/dL — ABNORMAL HIGH (ref 0.44–1.00)
GFR calc non Af Amer: 15 mL/min — ABNORMAL LOW (ref >60)
GFR, EST AFRICAN AMERICAN: 18 mL/min — AB (ref >60)
Glucose, Bld: 139 mg/dL — ABNORMAL HIGH (ref 65–99)
Potassium: 3.3 mmol/L — ABNORMAL LOW (ref 3.5–5.1)
SODIUM: 141 mmol/L (ref 135–145)
TOTAL PROTEIN: 6 g/dL — AB (ref 6.5–8.1)

## 2015-04-17 LAB — I-STAT CG4 LACTIC ACID, ED: Lactic Acid, Venous: 1.57 mmol/L (ref 0.5–2.0)

## 2015-04-17 LAB — D-DIMER, QUANTITATIVE (NOT AT ARMC): D DIMER QUANT: 2.57 ug{FEU}/mL — AB (ref 0.00–0.50)

## 2015-04-17 MED ORDER — COLCHICINE 0.6 MG PO TABS
0.6000 mg | ORAL_TABLET | Freq: Every day | ORAL | Status: DC | PRN
  Filled 2015-04-17: qty 1

## 2015-04-17 MED ORDER — ALBUTEROL SULFATE (2.5 MG/3ML) 0.083% IN NEBU: 2.5000 mg | INHALATION_SOLUTION | Freq: Four times a day (QID) | RESPIRATORY_TRACT | Status: DC | PRN

## 2015-04-17 MED ORDER — MORPHINE SULFATE ER 30 MG PO TBCR
30.0000 mg | EXTENDED_RELEASE_TABLET | Freq: Two times a day (BID) | ORAL | Status: DC
  Administered 2015-04-17 – 2015-04-22 (×9): 30 mg via ORAL
  Filled 2015-04-17 (×4): qty 1
  Filled 2015-04-17 (×2): qty 2
  Filled 2015-04-17 (×3): qty 1

## 2015-04-17 MED ORDER — ALLOPURINOL 100 MG PO TABS
200.0000 mg | ORAL_TABLET | Freq: Every day | ORAL | Status: DC
  Administered 2015-04-17 – 2015-04-22 (×6): 200 mg via ORAL
  Filled 2015-04-17 (×6): qty 2

## 2015-04-17 MED ORDER — HYDROCORTISONE NA SUCCINATE PF 100 MG IJ SOLR
100.0000 mg | INTRAMUSCULAR | Status: AC
  Administered 2015-04-17: 100 mg via INTRAVENOUS
  Filled 2015-04-17: qty 2

## 2015-04-17 MED ORDER — HEPARIN BOLUS VIA INFUSION
5000.0000 [IU] | Freq: Once | INTRAVENOUS | Status: AC
  Administered 2015-04-17: 5000 [IU] via INTRAVENOUS
  Filled 2015-04-17: qty 5000

## 2015-04-17 MED ORDER — ATORVASTATIN CALCIUM 10 MG PO TABS
10.0000 mg | ORAL_TABLET | Freq: Every evening | ORAL | Status: DC
  Administered 2015-04-17 – 2015-04-21 (×5): 10 mg via ORAL
  Filled 2015-04-17 (×5): qty 1

## 2015-04-17 MED ORDER — MORPHINE SULFATE 15 MG PO TABS: 7.5000 mg | ORAL_TABLET | Freq: Four times a day (QID) | ORAL | Status: DC | PRN

## 2015-04-17 MED ORDER — ALBUTEROL SULFATE HFA 108 (90 BASE) MCG/ACT IN AERS: 2.0000 | INHALATION_SPRAY | Freq: Four times a day (QID) | RESPIRATORY_TRACT | Status: DC | PRN

## 2015-04-17 MED ORDER — HEPARIN (PORCINE) IN NACL 100-0.45 UNIT/ML-% IJ SOLN
1500.0000 [IU]/h | INTRAMUSCULAR | Status: DC
  Administered 2015-04-17: 1500 [IU]/h via INTRAVENOUS
  Filled 2015-04-17: qty 250

## 2015-04-17 MED ORDER — LEVOFLOXACIN IN D5W 750 MG/150ML IV SOLN
750.0000 mg | Freq: Once | INTRAVENOUS | Status: AC
  Administered 2015-04-17: 750 mg via INTRAVENOUS
  Filled 2015-04-17: qty 150

## 2015-04-17 MED ORDER — SODIUM CHLORIDE 0.9 % IV BOLUS (SEPSIS)
500.0000 mL | Freq: Once | INTRAVENOUS | Status: AC
  Administered 2015-04-17: 500 mL via INTRAVENOUS

## 2015-04-17 MED ORDER — LEVOFLOXACIN IN D5W 750 MG/150ML IV SOLN: 750.0000 mg | INTRAVENOUS | Status: DC

## 2015-04-17 MED ORDER — INSULIN ASPART 100 UNIT/ML ~~LOC~~ SOLN
0.0000 [IU] | Freq: Three times a day (TID) | SUBCUTANEOUS | Status: DC
  Administered 2015-04-18 (×2): 3 [IU] via SUBCUTANEOUS
  Administered 2015-04-18: 2 [IU] via SUBCUTANEOUS
  Administered 2015-04-19 – 2015-04-20 (×4): 3 [IU] via SUBCUTANEOUS
  Administered 2015-04-20: 5 [IU] via SUBCUTANEOUS
  Administered 2015-04-20: 3 [IU] via SUBCUTANEOUS
  Administered 2015-04-21: 5 [IU] via SUBCUTANEOUS
  Administered 2015-04-21: 2 [IU] via SUBCUTANEOUS
  Administered 2015-04-21: 5 [IU] via SUBCUTANEOUS
  Administered 2015-04-22: 2 [IU] via SUBCUTANEOUS
  Administered 2015-04-22: 3 [IU] via SUBCUTANEOUS

## 2015-04-17 MED ORDER — HYDROCORTISONE NA SUCCINATE PF 100 MG IJ SOLR
50.0000 mg | Freq: Three times a day (TID) | INTRAMUSCULAR | Status: DC
  Administered 2015-04-18 – 2015-04-20 (×7): 50 mg via INTRAVENOUS
  Filled 2015-04-17 (×7): qty 2

## 2015-04-17 MED ORDER — GUAIFENESIN ER 600 MG PO TB12
1200.0000 mg | ORAL_TABLET | Freq: Two times a day (BID) | ORAL | Status: DC
  Administered 2015-04-17 – 2015-04-22 (×10): 1200 mg via ORAL
  Filled 2015-04-17 (×10): qty 2

## 2015-04-17 MED ORDER — HEPARIN SODIUM (PORCINE) 5000 UNIT/ML IJ SOLN: 5000.0000 [IU] | Freq: Three times a day (TID) | INTRAMUSCULAR | Status: DC

## 2015-04-17 MED ORDER — PANTOPRAZOLE SODIUM 40 MG PO TBEC
40.0000 mg | DELAYED_RELEASE_TABLET | Freq: Every day | ORAL | Status: DC
  Administered 2015-04-17 – 2015-04-22 (×6): 40 mg via ORAL
  Filled 2015-04-17 (×6): qty 1

## 2015-04-17 MED ORDER — LEVOTHYROXINE SODIUM 50 MCG PO TABS
50.0000 ug | ORAL_TABLET | Freq: Every day | ORAL | Status: DC
  Administered 2015-04-18 – 2015-04-22 (×5): 50 ug via ORAL
  Filled 2015-04-17 (×5): qty 1

## 2015-04-17 MED ORDER — SODIUM CHLORIDE 0.9 % IV SOLN
INTRAVENOUS | Status: DC
  Administered 2015-04-17: 125 mL/h via INTRAVENOUS
  Administered 2015-04-18 (×2): via INTRAVENOUS

## 2015-04-17 MED ORDER — ASPIRIN EC 81 MG PO TBEC
81.0000 mg | DELAYED_RELEASE_TABLET | Freq: Every morning | ORAL | Status: DC
  Administered 2015-04-18 – 2015-04-22 (×5): 81 mg via ORAL
  Filled 2015-04-17 (×5): qty 1

## 2015-04-17 MED ORDER — GABAPENTIN 600 MG PO TABS
600.0000 mg | ORAL_TABLET | Freq: Every evening | ORAL | Status: DC
  Administered 2015-04-17: 600 mg via ORAL
  Filled 2015-04-17 (×2): qty 1

## 2015-04-17 MED ORDER — FAMOTIDINE 20 MG PO TABS
20.0000 mg | ORAL_TABLET | Freq: Every day | ORAL | Status: DC
  Administered 2015-04-17 – 2015-04-21 (×5): 20 mg via ORAL
  Filled 2015-04-17 (×5): qty 1

## 2015-04-17 MED ORDER — DULOXETINE HCL 60 MG PO CPEP
60.0000 mg | ORAL_CAPSULE | Freq: Every day | ORAL | Status: DC
  Administered 2015-04-17 – 2015-04-22 (×6): 60 mg via ORAL
  Filled 2015-04-17 (×6): qty 1

## 2015-04-17 NOTE — ED Provider Notes (Signed)
CSN: [id number]     Arrival date & time 04/17/15  1524 History   First MD Initiated Contact with Patient 04/17/15 1601     Chief Complaint  Patient presents with  . Weakness  . Fatigue    HPI   [name] is a 74 y.o. female with a PMH of DM, HTN, HLD, obesity, CKD, COPD who presents to the ED with fatigue x 2 days. She states she has not been able to stay awake and feels "sleepy all the time." She denies exacerbating or alleviating factors. She reports intermittent central chest pain, shortness of breath, and cough productive of dark yellow sputum. She also states she experienced 1 episode of emesis earlier today. She denies abdominal pain or hematemesis. She reports constipation, which is unchanged from baseline. She denies fever, chills, numbness, weakness, paresthesia.   Past Medical History  Diagnosis Date  . Diabetes mellitus   . Hypertension   . Thyroid disease   . Arthritis     endstage changes bilateral knees/bilateral ankles.   . High cholesterol   . 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 (HCC) 02/17/2012    Continue diplopia, left eye pain and left headaches.    . Depression, reactive   . RA (rheumatoid arthritis) ([place])     has been off methotreaxte since 10/13.  . Contusion of left knee     due to fall 1/14.  [name] URI (upper respiratory infection)   . Clotting disorder ([place])     pt denies this  . Asthma   . Carotid artery occlusion   . Interstitial lung disease ([place])   . Morbid obesity ([place])   . Shortness of breath   . Family history of heart disease   . Chronic kidney disease   . COPD (chronic obstructive pulmonary disease) (HCC)     pulmonary fibrosis  . Normal coronary arteries     cardiac catheterization performed  10/31/14  . Diastolic dysfunction    Past Surgical History  Procedure Laterality Date  . Abdominal hysterectomy    . Ovary surgery    . Shoulder surgery Left   . Brain surgery      Gamma knife 10/13. Needs repeat  spring  '14  . Video bronchoscopy Bilateral 05/31/2013    Procedure: VIDEO BRONCHOSCOPY WITHOUT FLUORO;  Surgeon: Brand Males, MD;  Location: [facility];  Service: Cardiopulmonary;  Laterality: Bilateral;  . Video bronscoscopy  2000    lung  . Tonsillectomy  age 48  . Esophagogastroduodenoscopy (egd) with propofol N/A 09/14/2014    Procedure: ESOPHAGOGASTRODUODENOSCOPY (EGD) WITH PROPOFOL;  Surgeon: [name], MD;  Location: [facility];  Service: Endoscopy;  Laterality: N/A;  . Cardiac catheterization N/A 10/31/2014    Procedure: Right/Left Heart Cath and Coronary Angiography;  Surgeon: [name], MD;  Location: [facility];  Service: Cardiovascular;  Laterality: N/A;   Family History  Problem Relation Age of Onset  . Diabetes Mother   . Heart attack Mother   . Hypertension Father   . Lung cancer Father   . Diabetes Sister   . Diabetes Brother   . Hypertension Brother   . Kidney cancer Brother   . Uterine cancer Daughter   . Breast cancer Sister   . Rheum arthritis Maternal Uncle   . Gout Brother   . Kidney failure Brother     x 5  . Heart disease Brother   . Heart attack Brother    Social History  Substance Use Topics  . Smoking status: Never Smoker   . Smokeless tobacco: Never Used  . Alcohol Use: No   OB History    No data available      Review of Systems  Constitutional: Positive for fatigue. Negative for fever and chills.  Respiratory: Positive for cough and shortness of breath.   Cardiovascular: Positive for chest pain.  Gastrointestinal: Positive for nausea, vomiting and constipation. Negative for abdominal pain and diarrhea.  Genitourinary: Negative for dysuria, urgency and frequency.  Neurological: Negative for dizziness, syncope, weakness, light-headedness, numbness and headaches.  All other systems reviewed and are negative.     Allergies  Codeine; Remicade; and Zestril  Home Medications   Prior to Admission medications    Medication Sig Start Date End Date Taking? Authorizing Provider  ACCU-CHEK SMARTVIEW test strip 1 each by Other route every morning.  03/18/14   Historical Provider, MD  acetaminophen (TYLENOL) 500 MG tablet Take 500-1,000 mg by mouth 2 (two) times daily as needed for pain. Takes with tramadol    Historical Provider, MD  albuterol (PROVENTIL HFA;VENTOLIN HFA) 108 (90 BASE) MCG/ACT inhaler Inhale 2 puffs into the lungs every 6 (six) hours as needed for wheezing or shortness of breath.    Historical Provider, MD  allopurinol (ZYLOPRIM) 100 MG tablet Take 2 tablets (200 mg total) by mouth daily. 06/21/14   Meredith Staggers, MD  aspirin EC 81 MG tablet Take 81 mg by mouth every morning.     Historical Provider, MD  atorvastatin (LIPITOR) 20 MG tablet Take 10 mg by mouth every evening.     Historical Provider, MD  BENICAR HCT 40-25 MG per tablet Take 0.5 tablets by mouth every morning.  05/04/14   Historical Provider, MD  Calcium Carb-Cholecalciferol (CALCIUM + D3 PO) Take 2 tablets by mouth daily with lunch.     Historical Provider, MD  cholecalciferol (VITAMIN D) 1000 UNITS tablet Take 2,000 Units by mouth daily with lunch.     Historical Provider, MD  colchicine 0.6 MG tablet Take 0.6 mg by mouth daily as needed (GOUT).    Historical Provider, MD  diclofenac sodium (VOLTAREN) 1 % GEL Apply 1 application topically 3 (three) times daily. 03/22/15   Meredith Staggers, MD  DULoxetine (CYMBALTA) 60 MG capsule Take 1 capsule (60 mg total) by mouth daily. 11/15/14   Meredith Staggers, MD  famotidine (PEPCID) 20 MG tablet Take 1 tablet (20 mg total) by mouth at bedtime. 11/02/14   Erlene Quan, PA-C  fluticasone (FLONASE) 50 MCG/ACT nasal spray Place 2 sprays into both nostrils daily.    Historical Provider, MD  furosemide (LASIX) 40 MG tablet Take 1 tablet (40 mg total) by mouth daily. 11/08/14   Isaiah Serge, NP  gabapentin (NEURONTIN) 600 MG tablet Take 1 tablet (600 mg total) by mouth as directed. Take 2  tablet in the morning, 2 tablet in the middle of the day, and 3 tablets at night 08/01/13   Meredith Staggers, MD  guaiFENesin (MUCINEX) 600 MG 12 hr tablet Take 1,200 mg by mouth 2 (two) times daily.    Historical Provider, MD  levothyroxine (SYNTHROID, LEVOTHROID) 50 MCG tablet Take 1 tablet (50 mcg total) by mouth daily. 07/23/12   Bary Leriche, PA-C  metoprolol succinate (TOPROL-XL) 100 MG 24 hr tablet Take 100 mg by mouth every morning. Take with or immediately following a meal.    Historical Provider, MD  morphine (MS CONTIN) 30 MG 12 hr  tablet Take 1 tablet (30 mg total) by mouth every 12 (twelve) hours. 03/23/15   [name], NP  morphine (MSIR) 15 MG tablet TAKE 1/2 TO 1 TABLET BY MOUTH EVERY 6 HOURS AS NEEDED FOR MODERATE PAIN 02/21/15   Historical Provider, MD  Multiple Vitamin (MULTIVITAMIN WITH MINERALS) TABS Take 1 tablet by mouth daily with lunch.     Historical Provider, MD  OXYGEN-HELIUM IN Inhale 2 L into the lungs at bedtime. And on exertion    Historical Provider, MD  pantoprazole (PROTONIX) 40 MG tablet TAKE 1 TABLET BY MOUTH DAILY, MINUTES BEFORE IST MEAL  OF THE DAY 01/23/15   Brand Males, MD  predniSONE (DELTASONE) 10 MG tablet Take 2 tablets (20 mg total) by mouth daily with breakfast. Patient taking differently: Take 5 mg by mouth daily with breakfast.  11/02/14   Erlene Quan, PA-C  sodium chloride (OCEAN) 0.65 % SOLN nasal spray Place 1 spray into both nostrils as needed for congestion.    Historical Provider, MD  Spacer/Aero-Holding Chambers (AEROCHAMBER MV) inhaler Use as instructed 06/10/13   Tammy S Parrett, NP  VICTOZA 18 MG/3ML SOPN Inject 1.8 mg as directed daily.  01/26/15   Historical Provider, MD    BP 103/69 mmHg  Pulse 116  Temp(Src) 98 F (36.7 C) (Oral)  Resp 22  Wt 132.45 kg  SpO2 100% Physical Exam  Constitutional: She is oriented to person, place, and time. No distress.  Obese female in no acute distress.  HENT:  Head: Normocephalic and  atraumatic.  Right Ear: External ear normal.  Left Ear: External ear normal.  Nose: Nose normal.  Mouth/Throat: Uvula is midline, oropharynx is clear and moist and mucous membranes are normal.  Eyes: Conjunctivae, EOM and lids are normal. Pupils are equal, round, and reactive to light. Right eye exhibits no discharge. Left eye exhibits no discharge. No scleral icterus.  Neck: Normal range of motion. Neck supple.  Cardiovascular: Regular rhythm, normal heart sounds, intact distal pulses and normal pulses.  Tachycardia present.   Pulmonary/Chest: Effort normal and breath sounds normal. No respiratory distress. She has no wheezes. She has no rales.  Decreased air movement bilaterally.  Abdominal: Soft. Normal appearance and bowel sounds are normal. She exhibits no distension and no mass. There is no tenderness. There is no rigidity, no rebound and no guarding.  Musculoskeletal: Normal range of motion. She exhibits edema. She exhibits no tenderness.  2+ pitting lower extremity edema.  Neurological: She is alert and oriented to person, place, and time. She has normal strength. No cranial nerve deficit or sensory deficit.  Skin: Skin is warm, dry and intact. No rash noted. She is not diaphoretic. No erythema. No pallor.  Psychiatric: She has a normal mood and affect. Her speech is normal and behavior is normal.  Nursing note and vitals reviewed.   ED Course  Procedures (including critical care time)  Labs Review Labs Reviewed  COMPREHENSIVE METABOLIC PANEL - Abnormal; Notable for the following:    Potassium 3.3 (*)    Glucose, Bld 139 (*)    BUN 25 (*)    Creatinine, Ser 2.84 (*)    Calcium 8.8 (*)    Total Protein 6.0 (*)    Albumin 3.3 (*)    GFR calc non Af Amer 15 (*)    GFR calc Af Amer 18 (*)    All other components within normal limits  CBC - Abnormal; Notable for the following:    Hemoglobin 11.9 (*)  RDW 15.9 (*)    All other components within normal limits  D-DIMER,  QUANTITATIVE (NOT AT [facility]) - Abnormal; Notable for the following:    D-Dimer, Quant 2.57 (*)    All other components within normal limits  I-STAT ARTERIAL BLOOD GAS, ED - Abnormal; Notable for the following:    pCO2 arterial 48.4 (*)    pO2, Arterial 79.0 (*)    Bicarbonate 30.4 (*)    Acid-Base Excess 5.0 (*)    All other components within normal limits  URINALYSIS, ROUTINE W REFLEX MICROSCOPIC (NOT AT [facility])  I-STAT CG4 LACTIC ACID, ED  I-STAT TROPOININ, ED  I-STAT CG4 LACTIC ACID, ED    Imaging Review Dg Chest 2 View  04/17/2015  CLINICAL DATA:  Chest pain shortness of breath weakness 1 day, cough EXAM: CHEST  2 VIEW COMPARISON:  01/27/2015 FINDINGS: Study limited by body habitus. Cardiac silhouette exaggerated by lordotic positioning likely within normal limits. Mild vascular congestion. Very limited inspiratory effect. Increased opacity at both lung bases. No effusions. IMPRESSION: Bibasilar opacities with very limited lung volumes and limited overall by body habitus. Possibilities include lower lobe atelectasis or pneumonia. Electronically Signed   By: Skipper Cliche M.D.   On: 04/17/2015 16:32     I have personally reviewed and evaluated these images and lab results as part of my medical decision-making.   EKG Interpretation   Date/Time:  Tuesday April 17 2015 15:49:38 EST Ventricular Rate:  125 PR Interval:  136 QRS Duration: 80 QT Interval:  324 QTC Calculation: 467 R Axis:   -32 Text Interpretation:  Sinus tachycardia Left axis deviation Abnormal ECG  since last tracing no significant change Confirmed by Eulis Foster  MD, Vira Agar  ([id number]) on 04/17/2015 5:11:30 PM      MDM   Final diagnoses:  Febrile illness  AKI (acute kidney injury) [facility])  Peripheral edema    74 year old female presents with fatigue, which she states has been worsening over the past 2 days. Reports intermittent central chest pain, shortness of breath, and cough productive of dark yellow sputum.  Also states she experienced 1 episode of emesis earlier today. Denies abdominal pain or hematemesis. Reports constipation, which is unchanged from baseline. Denies fever, chills, numbness, weakness, paresthesia.  Patient is afebrile. Tachycardic to 110s. Blood pressure 90 systolic. Heart regular rhythm. Decreased air movement to lung fields bilaterally. Abdomen soft, nontender, nondistended. 2+ pitting lower extremity edema. Normal neuro exam with no focal deficit. Patient moves all extremities and ambulates without difficulty.  EKG remarkable for sinus tachycardia, left axis deviation, heart rate 125. Troponin negative. CBC negative for leukocytosis, hemoglobin 11.9. CMP with potassium 3.3, creatinine 2.84. Lactic acid within normal limits. Chest x-ray remarkable for bibasilar opacities possibilities include lower lobe atelectasis or pneumonia.  Will give levaquin and fluids. D-dimer pending.  D-dimer elevated at 2.57. Patient unable to undergo CT PE given renal function. ABG remarkable for pH 7.405, PCO2 48.4, PO2 79, bicarbonate 30.4. Hospitalist (Dr. Alcario Drought) consulted for admission. Patient to be admitted for further evaluation and management. Will obtain US of left lower extremity given edema and mild erythema on exam in the setting of elevated d-dimer.  BP 103/69 mmHg  Pulse 116  Temp(Src) 98 F (36.7 C) (Oral)  Resp 22  Wt 132.45 kg  SpO2 100%   Marella Chimes, PA-C 04/18/15 [place], MD 04/18/15 1036

## 2015-04-17 NOTE — ED Notes (Signed)
Family at bedside.

## 2015-04-17 NOTE — ED Notes (Signed)
Family at bedside. 

## 2015-04-17 NOTE — H&P (Signed)
[facility] History and Physical  [name] ZOX:[id number] DOB: 1940/06/16 DOA: 04/17/2015  Referring physician: EDP PCP: Maximino Greenland, MD   Chief Complaint: Fatigue   HPI: [name] is a 74 y.o. female with PMH including RA with ILD, obesity with BMI of 50, deconditioning.  Patient presents to the ED with fatigue, SOB that suddenly got worse today.  She has been having trouble with bronchitis / PNA for the past couple of weeks, but this was getting better recently after they switched her outpatient ABx to levaquin.  Her cough and congestion is no worse today than it had been she states.  She has had 1 episode of vomiting, has had some nausea as well.  She states she took none of her home meds today (including her 49m prednisone, she is steroid dependent long term from the RA).  For EMS she was initially hypoxic and her O2 was increased to 4L via [place] from her baseline 2L that she is usually on at home.  In the ED she was noted to initially be hypotensive with BPs as low as the 881Xsystolic (at baseline she is hypertensive and on multiple HTN meds), tachycardic to the 120s.  She has AKI with creatinine of 2.8 (baseline 1.5).  She also reports BLE edema which is chronic, redness and pain especially in the LLE which is new.  Review of Systems: Systems reviewed.  As above, otherwise negative  Past Medical History  Diagnosis Date  . Diabetes mellitus   . Hypertension   . Thyroid disease   . Arthritis     endstage changes bilateral knees/bilateral ankles.   . High cholesterol   . 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 (HCC) 02/17/2012    Continue diplopia, left eye pain and left headaches.    . Depression, reactive   . RA (rheumatoid arthritis) ([facility]     has been off methotreaxte since 10/13.  . Contusion of left knee     due to fall 1/14.  .Marland KitchenURI (upper respiratory infection)   . Clotting disorder ([facility]     pt denies this  . Asthma   .  Carotid artery occlusion   . Interstitial lung disease ([place]   . Morbid obesity ([facility]   . Shortness of breath   . Family history of heart disease   . Chronic kidney disease   . COPD (chronic obstructive pulmonary disease) (HCC)     pulmonary fibrosis  . Normal coronary arteries     cardiac catheterization performed  10/31/14  . Diastolic dysfunction    Past Surgical History  Procedure Laterality Date  . Abdominal hysterectomy    . [place] surgery    . Shoulder surgery Left   . Brain surgery      Gamma knife 10/13. Needs repeat spring  '14  . Video bronchoscopy Bilateral 05/31/2013    Procedure: VIDEO BRONCHOSCOPY WITHOUT FLUORO;  Surgeon: MBrand Males MD;  [facility]  Service: Cardiopulmonary;  Laterality: Bilateral;  . Video bronscoscopy  2000    lung  . Tonsillectomy  age 74 . Esophagogastroduodenoscopy (egd) with propofol N/A 09/14/2014    Procedure: ESOPHAGOGASTRODUODENOSCOPY (EGD) WITH PROPOFOL;  Surgeon: RInda Castle MD;  Location[facility];  Service: Endoscopy;  Laterality: N/A;  . Cardiac catheterization N/A 10/31/2014    Procedure: Right/Left Heart Cath and Coronary Angiography;  Surgeon: JJettie Booze MD;  [facility];  Service: Cardiovascular;  Laterality: N/A;   Social  History:  reports that she has never smoked. She has never used smokeless tobacco. She reports that she does not drink alcohol or use illicit drugs.  Allergies  Allergen Reactions  . Codeine Swelling    Facial swelling  . Remicade [Infliximab] Anaphylaxis    "sent me into shock"  . Zestril [Lisinopril] Swelling    Face and neck swelling    Family History  Problem Relation Age of Onset  . Diabetes Mother   . Heart attack Mother   . Hypertension Father   . Lung cancer Father   . Diabetes Sister   . Diabetes Brother   . Hypertension Brother   . Kidney cancer Brother   . Uterine cancer Daughter   . Breast cancer Sister   . Rheum arthritis Maternal  Uncle   . Gout Brother   . Kidney failure Brother     x 5  . Heart disease Brother   . Heart attack Brother      Prior to Admission medications   Medication Sig Start Date End Date Taking? Authorizing Provider  ACCU-CHEK SMARTVIEW test strip 1 each by Other route every morning.  03/18/14   Historical Provider, MD  acetaminophen (TYLENOL) 500 MG tablet Take 500-1,000 mg by mouth 2 (two) times daily as needed for pain. Takes with tramadol    Historical Provider, MD  albuterol (PROVENTIL HFA;VENTOLIN HFA) 108 (90 BASE) MCG/ACT inhaler Inhale 2 puffs into the lungs every 6 (six) hours as needed for wheezing or shortness of breath.    Historical Provider, MD  allopurinol (ZYLOPRIM) 100 MG tablet Take 2 tablets (200 mg total) by mouth daily. 06/21/14   Meredith Staggers, MD  aspirin EC 81 MG tablet Take 81 mg by mouth every morning.     Historical Provider, MD  atorvastatin (LIPITOR) 20 MG tablet Take 10 mg by mouth every evening.     Historical Provider, MD  BENICAR HCT 40-25 MG per tablet Take 0.5 tablets by mouth every morning.  05/04/14   Historical Provider, MD  Calcium Carb-Cholecalciferol (CALCIUM + D3 PO) Take 2 tablets by mouth daily with lunch.     Historical Provider, MD  cholecalciferol (VITAMIN D) 1000 UNITS tablet Take 2,000 Units by mouth daily with lunch.     Historical Provider, MD  colchicine 0.6 MG tablet Take 0.6 mg by mouth daily as needed (GOUT).    Historical Provider, MD  diclofenac sodium (VOLTAREN) 1 % GEL Apply 1 application topically 3 (three) times daily. 03/22/15   Meredith Staggers, MD  DULoxetine (CYMBALTA) 60 MG capsule Take 1 capsule (60 mg total) by mouth daily. 11/15/14   Meredith Staggers, MD  famotidine (PEPCID) 20 MG tablet Take 1 tablet (20 mg total) by mouth at bedtime. 11/02/14   Erlene Quan, PA-C  fluticasone (FLONASE) 50 MCG/ACT nasal spray Place 2 sprays into both nostrils daily.    Historical Provider, MD  furosemide (LASIX) 40 MG tablet Take 1 tablet (40 mg  total) by mouth daily. 11/08/14   Isaiah Serge, NP  gabapentin (NEURONTIN) 600 MG tablet Take 1 tablet (600 mg total) by mouth as directed. Take 2 tablet in the morning, 2 tablet in the middle of the day, and 3 tablets at night 08/01/13   Meredith Staggers, MD  guaiFENesin (MUCINEX) 600 MG 12 hr tablet Take 1,200 mg by mouth 2 (two) times daily.    Historical Provider, MD  levothyroxine (SYNTHROID, LEVOTHROID) 50 MCG tablet Take 1 tablet (50 mcg  total) by mouth daily. 07/23/12   Bary Leriche, PA-C  metoprolol succinate (TOPROL-XL) 100 MG 24 hr tablet Take 100 mg by mouth every morning. Take with or immediately following a meal.    Historical Provider, MD  morphine (MS CONTIN) 30 MG 12 hr tablet Take 1 tablet (30 mg total) by mouth every 12 (twelve) hours. 03/23/15   [name], NP  morphine (MSIR) 15 MG tablet TAKE 1/2 TO 1 TABLET BY MOUTH EVERY 6 HOURS AS NEEDED FOR MODERATE PAIN 02/21/15   Historical Provider, MD  Multiple Vitamin (MULTIVITAMIN WITH MINERALS) TABS Take 1 tablet by mouth daily with lunch.     Historical Provider, MD  OXYGEN-HELIUM IN Inhale 2 L into the lungs at bedtime. And on exertion    Historical Provider, MD  pantoprazole (PROTONIX) 40 MG tablet TAKE 1 TABLET BY MOUTH DAILY, MINUTES BEFORE IST MEAL  OF THE DAY 01/23/15   Brand Males, MD  predniSONE (DELTASONE) 10 MG tablet Take 2 tablets (20 mg total) by mouth daily with breakfast. Patient taking differently: Take 5 mg by mouth daily with breakfast.  11/02/14   Erlene Quan, PA-C  sodium chloride (OCEAN) 0.65 % SOLN nasal spray Place 1 spray into both nostrils as needed for congestion.    Historical Provider, MD  Spacer/Aero-Holding Chambers (AEROCHAMBER MV) inhaler Use as instructed 06/10/13   Tammy S Parrett, NP  VICTOZA 18 MG/3ML SOPN Inject 1.8 mg as directed daily.  01/26/15   Historical Provider, MD   Physical Exam: Filed Vitals:   04/17/15 1930 04/17/15 1938  BP: 76/62 103/69  Pulse:  116  Temp:    Resp: 16 22     BP 103/69 mmHg  Pulse 116  Temp(Src) 98 F (36.7 C) (Oral)  Resp 22  Wt 132.45 kg (292 lb)  SpO2 100%  General Appearance:    Alert, oriented, no distress, appears stated age  Head:    Normocephalic, atraumatic  Eyes:    PERRL, EOMI, sclera non-icteric        Nose:   Nares without drainage or epistaxis. Mucosa, turbinates normal  Throat:   Moist mucous membranes. Oropharynx without erythema or exudate.  Neck:   Supple. No carotid bruits.  No thyromegaly.  No lymphadenopathy.   Back:     No CVA tenderness, no spinal tenderness  Lungs:     Clear to auscultation bilaterally, without wheezes, rhonchi or rales  Chest wall:    No tenderness to palpitation  Heart:    Regular rate and rhythm without murmurs, gallops, rubs  Abdomen:     Soft, non-tender, nondistended, normal bowel sounds, no organomegaly  Genitalia:    deferred  Rectal:    deferred  Extremities:   BLE edema, tenderness to the LLE with erythema.  Pulses:   2+ and symmetric all extremities  Skin:   Skin color, texture, turgor normal, no rashes or lesions  Lymph nodes:   Cervical, supraclavicular, and axillary nodes normal  Neurologic:   CNII-XII intact. Normal strength, sensation and reflexes      throughout    Labs on Admission:  Basic Metabolic Panel:  Recent Labs Lab 04/17/15 1557  NA 141  K 3.3*  CL 101  CO2 32  GLUCOSE 139*  BUN 25*  CREATININE 2.84*  CALCIUM 8.8*   Liver Function Tests:  Recent Labs Lab 04/17/15 1557  AST 17  ALT 28  ALKPHOS 51  BILITOT 0.8  PROT 6.0*  ALBUMIN 3.3*   No results  for input(s): LIPASE, AMYLASE in the last 168 hours. No results for input(s): AMMONIA in the last 168 hours. CBC:  Recent Labs Lab 04/17/15 1557  WBC 9.3  HGB 11.9*  HCT 39.3  MCV 97.8  PLT 154   Cardiac Enzymes: No results for input(s): CKTOTAL, CKMB, CKMBINDEX, TROPONINI in the last 168 hours.  BNP (last 3 results)  Recent Labs  06/06/14 1250  PROBNP 18.0   CBG: No results for  input(s): GLUCAP in the last 168 hours.  Radiological Exams on Admission: Dg Chest 2 View  04/17/2015  CLINICAL DATA:  Chest pain shortness of breath weakness 1 day, cough EXAM: CHEST  2 VIEW COMPARISON:  01/27/2015 FINDINGS: Study limited by body habitus. Cardiac silhouette exaggerated by lordotic positioning likely within normal limits. Mild vascular congestion. Very limited inspiratory effect. Increased opacity at both lung bases. No effusions. IMPRESSION: Bibasilar opacities with very limited lung volumes and limited overall by body habitus. Possibilities include lower lobe atelectasis or pneumonia. Electronically Signed   By: Skipper Cliche M.D.   On: 04/17/2015 16:32    EKG: Independently reviewed.  Assessment/Plan Principal Problem:   Hypotension Active Problems:   Essential hypertension   Rheumatoid arthritis (HCC)   ILD (interstitial lung disease) (HCC)   Bilateral edema of lower extremity   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)   Acute on chronic respiratory failure with hypoxia (HCC)   CAP (community acquired pneumonia)   Acute kidney injury superimposed on chronic kidney disease ([place])   1. Hypotension - 1. DDx includes: 1. Adrenal insufficiency - very possible especially given the N/V, known steroid dependency, missing steroids today at home.  Will put patient on stress dose steroids and see how she responds 2. Pulmonary embolism - Also possible given the leg swelling, elevated D.Dimer, checking [place] now of her LLE.  I have a high enough suspicion here to empirically start heparin gtt in the mean time.  Spoke with radiologist: because of suspected CAP and her known chronic ILD, VQ scan will likely be less useful.  He says they can do [place] leg stat tonight though (have ordered this). 3. Sepsis - although her symptoms of congestion, cough, have been improving on the levaquin and hasnt worsened at all today.  She does have new O2 requirement, possible bilateral PNA  on CXR  Also she has BLE edema and if anything seems on the fluid overloaded side.  Got IVF bolus in ED.  Will switch patient to zosyn / vanc if [place] of leg is negative for DVT. 2. Acute on chronic resp failure with hypoxia - increased O2 requirement, see above. 3. DM2 - SSI AC/HS while on stress dose steroids 4. HTN - holding BP meds due to hypotension 5. CAP - continue Levaquin as IV for now as this appears to have been responding to treatment from HPI.    Code Status: Full  Family Communication: Family at bedside Disposition Plan: Admit to inpatient   Time spent: 76 min  GARDNER, JARED M. [facility] Pager [phone]  If 7AM-7PM, please contact the day team taking care of the patient [email or web address] Password [facility] 04/17/2015, 8:29 PM

## 2015-04-17 NOTE — ED Notes (Signed)
Phlebotomy at the bedside

## 2015-04-17 NOTE — ED Notes (Signed)
Per EMS: pt from home c/o generalized weakness and fatigue x 2 days; pt on home O2 at 2L all the time

## 2015-04-17 NOTE — ED Notes (Signed)
Informed Jessica - RN of pt's BP

## 2015-04-17 NOTE — ED Provider Notes (Signed)
  Face-to-face evaluation   History: Patient with malaise, tiredness, achiness for several days. Decreased appetite today. She has decreased urinary output as well. She is using her usual medications without relief.  Physical exam: Obese, alert, elderly female. Somewhat lethargic. Heart regular rate and rhythm without murmur. Lungs clear anteriorly. Abdomen soft, mild epigastric tenderness. No palpable suprapubic mass.  Medical screening examination/treatment/procedure(s) were conducted as a shared visit with non-physician practitioner(s) and myself. I personally evaluated the patient during the encounter  Daleen Bo, MD 04/18/15 1036

## 2015-04-17 NOTE — Progress Notes (Signed)
ANTICOAGULATION CONSULT NOTE - Initial Consult  Pharmacy Consult for Heparin Indication: suspected VTE  Allergies  Allergen Reactions  . Codeine Swelling    Facial swelling  . Remicade [Infliximab] Anaphylaxis    "sent me into shock"  . Zestril [Lisinopril] Swelling    Face and neck swelling    Patient Measurements: Height: _0  (162.6 cm) Weight: 292 lb (132.45 kg) IBW/kg (Calculated) : 54.7 Heparin Dosing Weight: 87 kg  Vital Signs: Temp: 98 F (36.7 C) (11/29 1541) Temp Source: Oral (11/29 1541) BP: 108/60 mmHg (11/29 2000) Pulse Rate: 116 (11/29 1938)  Labs:  Recent Labs  04/17/15 1557  HGB 11.9*  HCT 39.3  PLT 154  CREATININE 2.84*    Estimated Creatinine Clearance: 23.9 mL/min (by C-G formula based on Cr of 2.84).   Medical History: Past Medical History  Diagnosis Date  . Diabetes mellitus   . Hypertension   . Thyroid disease   . Arthritis     endstage changes bilateral knees/bilateral ankles.   . High cholesterol   . 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 (HCC) 02/17/2012    Continue diplopia, left eye pain and left headaches.    . Depression, reactive   . RA (rheumatoid arthritis) ([place])     has been off methotreaxte since 10/13.  . Contusion of left knee     due to fall 1/14.  [name] URI (upper respiratory infection)   . Clotting disorder ([place])     pt denies this  . Asthma   . Carotid artery occlusion   . Interstitial lung disease ([place])   . Morbid obesity ([place])   . Shortness of breath   . Family history of heart disease   . Chronic kidney disease   . COPD (chronic obstructive pulmonary disease) (HCC)     pulmonary fibrosis  . Normal coronary arteries     cardiac catheterization performed  10/31/14  . Diastolic dysfunction     Medications:   (Not in a hospital admission) Scheduled:  . allopurinol  200 mg Oral Daily  . [START ON 04/18/2015] aspirin EC  81 mg Oral q morning - 10a  . atorvastatin  10 mg Oral QPM  .  DULoxetine  60 mg Oral Daily  . famotidine  20 mg Oral QHS  . gabapentin  600 mg Oral UD  . guaiFENesin  1,200 mg Oral BID  . heparin  5,000 Units Subcutaneous 3 times per day  . hydrocortisone sod succinate (SOLU-CORTEF) inj  100 mg Intravenous STAT  . [START ON 04/18/2015] hydrocortisone sod succinate (SOLU-CORTEF) inj  50 mg Intravenous Q8H  . [START ON 04/18/2015] insulin aspart  0-15 Units Subcutaneous TID WC  . levothyroxine  50 mcg Oral Daily  . morphine  30 mg Oral Q12H  . pantoprazole  40 mg Oral Daily   Infusions:  . sodium chloride 125 mL/hr (04/17/15 1749)  . [START ON 04/18/2015] levofloxacin (LEVAQUIN) IV      Assessment: 74yo female with history of DM2, HTN, HLD, CKD and COPD presents with weakness and fatigue. Pharmacy is consulted to dose heparin for suspected VTE. Hgb 11.9, Plt 154, sCr 2.84, D-dimer 2.57.  Goal of Therapy:  Heparin level 0.3-0.7 units/ml Monitor platelets by anticoagulation protocol: Yes   Plan:  Give 5000 units bolus x 1 Start heparin infusion at 1500 units/hr Check anti-Xa level in 8 hours and daily while on heparin Continue to monitor H&H and platelets  Andrey Cota. Diona Foley, PharmD, [place] Clinical Pharmacist Pager  [phone] 04/17/2015,9:02 PM

## 2015-04-17 NOTE — ED Notes (Signed)
Admitting MD at the bedside.  

## 2015-04-17 NOTE — ED Notes (Signed)
Called RT to draw ABG

## 2015-04-17 NOTE — ED Notes (Signed)
PA at the bedside

## 2015-04-18 ENCOUNTER — Inpatient Hospital Stay (HOSPITAL_COMMUNITY): Payer: Medicare Other

## 2015-04-18 ENCOUNTER — Encounter (HOSPITAL_COMMUNITY): Payer: Self-pay | Admitting: *Deleted

## 2015-04-18 DIAGNOSIS — I959 Hypotension, unspecified: Secondary | ICD-10-CM

## 2015-04-18 DIAGNOSIS — R0902 Hypoxemia: Secondary | ICD-10-CM

## 2015-04-18 DIAGNOSIS — J849 Interstitial pulmonary disease, unspecified: Secondary | ICD-10-CM

## 2015-04-18 DIAGNOSIS — R600 Localized edema: Secondary | ICD-10-CM | POA: Insufficient documentation

## 2015-04-18 DIAGNOSIS — M79609 Pain in unspecified limb: Secondary | ICD-10-CM

## 2015-04-18 DIAGNOSIS — E1140 Type 2 diabetes mellitus with diabetic neuropathy, unspecified: Secondary | ICD-10-CM

## 2015-04-18 DIAGNOSIS — J9621 Acute and chronic respiratory failure with hypoxia: Secondary | ICD-10-CM

## 2015-04-18 DIAGNOSIS — M7989 Other specified soft tissue disorders: Secondary | ICD-10-CM

## 2015-04-18 DIAGNOSIS — I5033 Acute on chronic diastolic (congestive) heart failure: Secondary | ICD-10-CM

## 2015-04-18 DIAGNOSIS — J810 Acute pulmonary edema: Secondary | ICD-10-CM | POA: Insufficient documentation

## 2015-04-18 DIAGNOSIS — J189 Pneumonia, unspecified organism: Secondary | ICD-10-CM

## 2015-04-18 DIAGNOSIS — R609 Edema, unspecified: Secondary | ICD-10-CM | POA: Insufficient documentation

## 2015-04-18 DIAGNOSIS — N179 Acute kidney failure, unspecified: Secondary | ICD-10-CM | POA: Insufficient documentation

## 2015-04-18 LAB — BASIC METABOLIC PANEL
Anion gap: 8 (ref 5–15)
Anion gap: 8 (ref 5–15)
BUN: 22 mg/dL — ABNORMAL HIGH (ref 6–20)
BUN: 21 mg/dL — ABNORMAL HIGH (ref 6–20)
CALCIUM: 8 mg/dL — AB (ref 8.9–10.3)
CHLORIDE: 101 mmol/L (ref 101–111)
CHLORIDE: 102 mmol/L (ref 101–111)
CO2: 30 mmol/L (ref 22–32)
CO2: 28 mmol/L (ref 22–32)
CREATININE: 1.69 mg/dL — AB (ref 0.44–1.00)
Calcium: 8.2 mg/dL — ABNORMAL LOW (ref 8.9–10.3)
Creatinine, Ser: 2.01 mg/dL — ABNORMAL HIGH (ref 0.44–1.00)
GFR calc Af Amer: 34 mL/min — ABNORMAL LOW (ref >60)
GFR calc non Af Amer: 23 mL/min — ABNORMAL LOW (ref >60)
GFR calc non Af Amer: 29 mL/min — ABNORMAL LOW (ref >60)
GFR, EST AFRICAN AMERICAN: 27 mL/min — AB (ref >60)
GLUCOSE: 142 mg/dL — AB (ref 65–99)
Glucose, Bld: 199 mg/dL — ABNORMAL HIGH (ref 65–99)
POTASSIUM: 3.7 mmol/L (ref 3.5–5.1)
Potassium: 3.6 mmol/L (ref 3.5–5.1)
SODIUM: 139 mmol/L (ref 135–145)
Sodium: 138 mmol/L (ref 135–145)

## 2015-04-18 LAB — CBC
HEMATOCRIT: 34.9 % — AB (ref 36.0–46.0)
HEMOGLOBIN: 10.7 g/dL — AB (ref 12.0–15.0)
MCH: 29.6 pg (ref 26.0–34.0)
MCHC: 30.7 g/dL (ref 30.0–36.0)
MCV: 96.4 fL (ref 78.0–100.0)
Platelets: 147 10*3/uL — ABNORMAL LOW (ref 150–400)
RBC: 3.62 MIL/uL — AB (ref 3.87–5.11)
RDW: 15.7 % — ABNORMAL HIGH (ref 11.5–15.5)
WBC: 7.1 10*3/uL (ref 4.0–10.5)

## 2015-04-18 LAB — GLUCOSE, CAPILLARY
GLUCOSE-CAPILLARY: 150 mg/dL — AB (ref 65–99)
Glucose-Capillary: 204 mg/dL — ABNORMAL HIGH (ref 65–99)
Glucose-Capillary: 176 mg/dL — ABNORMAL HIGH (ref 65–99)
Glucose-Capillary: 148 mg/dL — ABNORMAL HIGH (ref 65–99)

## 2015-04-18 LAB — URINE MICROSCOPIC-ADD ON: WBC UA: NONE SEEN WBC/hpf (ref 0–5)

## 2015-04-18 LAB — URINALYSIS, ROUTINE W REFLEX MICROSCOPIC
Bilirubin Urine: NEGATIVE
GLUCOSE, UA: NEGATIVE mg/dL
KETONES UR: NEGATIVE mg/dL
Leukocytes, UA: NEGATIVE
Nitrite: NEGATIVE
PH: 5 (ref 5.0–8.0)
PROTEIN: NEGATIVE mg/dL
Specific Gravity, Urine: 1.017 (ref 1.005–1.030)

## 2015-04-18 LAB — BRAIN NATRIURETIC PEPTIDE: B NATRIURETIC PEPTIDE 5: 9.9 pg/mL (ref 0.0–100.0)

## 2015-04-18 LAB — HEPARIN LEVEL (UNFRACTIONATED)
HEPARIN UNFRACTIONATED: 0.61 [IU]/mL (ref 0.30–0.70)
Heparin Unfractionated: 1 IU/mL — ABNORMAL HIGH (ref 0.30–0.70)

## 2015-04-18 LAB — MRSA PCR SCREENING: MRSA by PCR: NEGATIVE

## 2015-04-18 LAB — D-DIMER, QUANTITATIVE (NOT AT ARMC): D DIMER QUANT: 2.54 ug{FEU}/mL — AB (ref 0.00–0.50)

## 2015-04-18 MED ORDER — HEPARIN (PORCINE) IN NACL 100-0.45 UNIT/ML-% IJ SOLN
1250.0000 [IU]/h | INTRAMUSCULAR | Status: DC
  Administered 2015-04-18 – 2015-04-19 (×3): 1250 [IU]/h via INTRAVENOUS
  Filled 2015-04-18 (×2): qty 250

## 2015-04-18 MED ORDER — LEVOFLOXACIN IN D5W 750 MG/150ML IV SOLN
750.0000 mg | INTRAVENOUS | Status: DC
  Administered 2015-04-19: 750 mg via INTRAVENOUS
  Filled 2015-04-18 (×2): qty 150

## 2015-04-18 MED ORDER — GABAPENTIN 600 MG PO TABS
600.0000 mg | ORAL_TABLET | Freq: Three times a day (TID) | ORAL | Status: DC
  Administered 2015-04-18 – 2015-04-19 (×3): 600 mg via ORAL
  Filled 2015-04-18 (×5): qty 1

## 2015-04-18 NOTE — Consult Note (Signed)
PULMONARY / CRITICAL CARE MEDICINE   Name: [name] MRN: [id number] DOB: 1940/10/21    ADMISSION DATE:  04/17/2015 CONSULTATION DATE:  11/30 REFERRING MD : [facility]  CHIEF COMPLAINT:  AMS  HISTORY OF PRESENT ILLNESS:   74 MO(291 lbs) AAF with known ILD/RA followed by Dr. Brantley Persons (last visit 04/16/15) who has a known meningoma (sterotatic gamma knife 2013) and followed at [facility] last seen 2015. She is on chronic steroids for RA /ILD and was noted to be somnolent and difficult to arouse. Transported to [facility] and treated for hypoxia and hypotension and suspected PE treated with empiric heparin. She is awake and alert, O2 sats 99% on 4 l [place]. She is in no real distress.  I suspect her MSIR may be contributing to her decreased LOC.  Doubt she has PE.  PAST MEDICAL HISTORY :  She  has a past medical history of Diabetes mellitus; Hypertension; Thyroid disease; Arthritis; High cholesterol; 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 ([place]) (02/17/2012); Depression, reactive; RA (rheumatoid arthritis) ([place]); Contusion of left knee; URI (upper respiratory infection); Clotting disorder ([place]); Asthma; Carotid artery occlusion; Interstitial lung disease ([place]); Morbid obesity ([place]); Shortness of breath; Family history of heart disease; Chronic kidney disease; COPD (chronic obstructive pulmonary disease) ([place]); Normal coronary arteries; and Diastolic dysfunction.  PAST SURGICAL HISTORY: She  has past surgical history that includes Abdominal hysterectomy; [place] surgery; Shoulder surgery (Left); Brain surgery; Video bronchoscopy (Bilateral, 05/31/2013); video bronscoscopy (2000); Tonsillectomy (age 89); Esophagogastroduodenoscopy (egd) with propofol (N/A, 09/14/2014); and Cardiac catheterization (N/A, 10/31/2014).  Allergies  Allergen Reactions  . Codeine Swelling    Facial swelling  . Remicade [Infliximab] Anaphylaxis    "sent me into shock"  . Zestril [Lisinopril] Swelling    Face and neck  swelling    No current facility-administered medications on file prior to encounter.   Current Outpatient Prescriptions on File Prior to Encounter  Medication Sig  . acetaminophen (TYLENOL) 500 MG tablet Take 500-1,000 mg by mouth 2 (two) times daily as needed for pain. Takes with tramadol  . albuterol (PROVENTIL HFA;VENTOLIN HFA) 108 (90 BASE) MCG/ACT inhaler Inhale 2 puffs into the lungs every 6 (six) hours as needed for wheezing or shortness of breath.  . allopurinol (ZYLOPRIM) 100 MG tablet Take 2 tablets (200 mg total) by mouth daily.  [name] aspirin EC 81 MG tablet Take 81 mg by mouth every morning.   [name] atorvastatin (LIPITOR) 20 MG tablet Take 10 mg by mouth every evening.   [name] BENICAR HCT 40-25 MG per tablet Take 0.5 tablets by mouth every morning.   . Calcium Carb-Cholecalciferol (CALCIUM + D3 PO) Take 2 tablets by mouth daily with lunch.   . cholecalciferol (VITAMIN D) 1000 UNITS tablet Take 2,000 Units by mouth daily with lunch.   . colchicine 0.6 MG tablet Take 0.6 mg by mouth daily as needed (GOUT).  [name] diclofenac sodium (VOLTAREN) 1 % GEL Apply 1 application topically 3 (three) times daily.  . DULoxetine (CYMBALTA) 60 MG capsule Take 1 capsule (60 mg total) by mouth daily.  . famotidine (PEPCID) 20 MG tablet Take 1 tablet (20 mg total) by mouth at bedtime.  . fluticasone (FLONASE) 50 MCG/ACT nasal spray Place 2 sprays into both nostrils daily.  . furosemide (LASIX) 40 MG tablet Take 1 tablet (40 mg total) by mouth daily.  [name] gabapentin (NEURONTIN) 600 MG tablet Take 1 tablet (600 mg total) by mouth as directed. Take 2 tablet in the morning, 2 tablet in the  middle of the day, and 3 tablets at night  . guaiFENesin (MUCINEX) 600 MG 12 hr tablet Take 1,200 mg by mouth 2 (two) times daily.  [name] levothyroxine (SYNTHROID, LEVOTHROID) 50 MCG tablet Take 1 tablet (50 mcg total) by mouth daily.  . metoprolol succinate (TOPROL-XL) 100 MG 24 hr tablet Take 100 mg by mouth every morning. Take with or  immediately following a meal.  . morphine (MS CONTIN) 30 MG 12 hr tablet Take 1 tablet (30 mg total) by mouth every 12 (twelve) hours.  [name] morphine (MSIR) 15 MG tablet TAKE 1/2 TO 1 TABLET BY MOUTH EVERY 6 HOURS AS NEEDED FOR MODERATE PAIN  . Multiple Vitamin (MULTIVITAMIN WITH MINERALS) TABS Take 1 tablet by mouth daily with lunch.   . OXYGEN-HELIUM IN Inhale 2 L into the lungs at bedtime. And on exertion  . pantoprazole (PROTONIX) 40 MG tablet TAKE 1 TABLET BY MOUTH DAILY, MINUTES BEFORE IST MEAL  OF THE DAY  . sodium chloride (OCEAN) 0.65 % SOLN nasal spray Place 1 spray into both nostrils as needed for congestion.  [name] Spacer/Aero-Holding Chambers (AEROCHAMBER MV) inhaler Use as instructed  . VICTOZA 18 MG/3ML SOPN Inject 1.8 mg as directed daily.   [name] ACCU-CHEK SMARTVIEW test strip 1 each by Other route every morning.   . predniSONE (DELTASONE) 10 MG tablet Take 2 tablets (20 mg total) by mouth daily with breakfast. (Patient not taking: Reported on 04/17/2015)    FAMILY HISTORY:  Her indicated that her mother is deceased. She indicated that her father is deceased. She indicated that her sister is alive. She indicated that only one of her two brothers is alive.   SOCIAL HISTORY: She  reports that she has never smoked. She has never used smokeless tobacco. She reports that she does not drink alcohol or use illicit drugs.  REVIEW OF SYSTEMS:   10 point review of system taken, please see HPI for positives and negatives.   SUBJECTIVE:   VITAL SIGNS: BP 124/84 mmHg  Pulse 102  Temp(Src) 98 F (36.7 C) (Oral)  Resp 25  Ht _0  (1.626 m)  Wt 291 lb 14.2 oz (132.4 kg)  BMI 50.08 kg/m2  SpO2 96%  HEMODYNAMICS:    VENTILATOR SETTINGS:    INTAKE / OUTPUT: I/O last 3 completed shifts: In: 1636.9 [I.V.:1636.9] Out: -   PHYSICAL EXAMINATION: General:  MOAAF NAD at rest Neuro:  Intact, follows commands, speech clear HEENT: No jvd, no neck Cardiovascular:  HSD RRR Lungs:   Decreased bs bases Abdomen:obese Musculoskeletal: intact Skin:  Warm and dry  LABS:  CBC  Recent Labs Lab 04/17/15 1557 04/18/15 0530  WBC 9.3 7.1  HGB 11.9* 10.7*  HCT 39.3 34.9*  PLT 154 147*   Coag's No results for input(s): APTT, INR in the last 168 hours. BMET  Recent Labs Lab 04/17/15 1557 04/18/15 0530  NA 141 139  K 3.3* 3.7  CL 101 101  CO2 32 30  BUN 25* 22*  CREATININE 2.84* 2.01*  GLUCOSE 139* 199*   Electrolytes  Recent Labs Lab 04/17/15 1557 04/18/15 0530  CALCIUM 8.8* 8.2*   Sepsis Markers  Recent Labs Lab 04/17/15 1606  LATICACIDVEN 1.57   ABG  Recent Labs Lab 04/17/15 1819  PHART 7.405  PCO2ART 48.4*  PO2ART 79.0*   Liver Enzymes  Recent Labs Lab 04/17/15 1557  AST 17  ALT 28  ALKPHOS 51  BILITOT 0.8  ALBUMIN 3.3*   Cardiac Enzymes No results for input(s): TROPONINI,  PROBNP in the last 168 hours. Glucose  Recent Labs Lab 04/18/15 0754  GLUCAP 204*    Imaging Dg Chest 2 View  04/17/2015  CLINICAL DATA:  Chest pain shortness of breath weakness 1 day, cough EXAM: CHEST  2 VIEW COMPARISON:  01/27/2015 FINDINGS: Study limited by body habitus. Cardiac silhouette exaggerated by lordotic positioning likely within normal limits. Mild vascular congestion. Very limited inspiratory effect. Increased opacity at both lung bases. No effusions. IMPRESSION: Bibasilar opacities with very limited lung volumes and limited overall by body habitus. Possibilities include lower lobe atelectasis or pneumonia. Electronically Signed   By: Skipper Cliche M.D.   On: 04/17/2015 16:32     STUDIES:    CULTURES:   ANTIBIOTICS: 11/29 levaquin>>  SIGNIFICANT EVENTS:   LINES/TUBES:   DISCUSSION: 74 MO(291 lbs) AAF with known ILD/RA followed by Dr. Brantley Persons (last visit 04/16/15) who has a known meningoma (sterotatic gamma knife 2013) and followed at [facility] last seen 2015. She is on chronic steroids for RA /ILD and was noted to be  somnolent and difficult to arouse. Transported to [facility] and treated for hypoxia and hypotension and suspected PE treated with empiric heparin. She is awake and alert, O2 sats 99% on 4 l [place]. She is in no real distress.  I suspect her MSIR may be contributing to her decreased LOC.  Doubt she has PE.    ASSESSMENT / PLAN:  PULMONARY A: ILD secondary to RA treatment Suspected OSA Chronic hypoxia  P:   Consider nocturnal Cpap Out patient sleep study Decreased narcotics  CARDIOVASCULAR A:  Episode of hypotension P:  Hold anthypertensives  RENAL Lab Results  Component Value Date   CREATININE 2.01* 04/18/2015   CREATININE 2.84* 04/17/2015   CREATININE 1.58* 01/27/2015   CREATININE 1.39* 11/16/2014   CREATININE 1.22* 06/21/2014   CREATININE 1.7* 02/17/2012    A:   CRI P:   No CT angio  GASTROINTESTINAL A:   GI protection P:   PPI  HEMATOLOGIC A:    meningioma P:  Per IM  INFECTIOUS A:   Chronic bronchitis in setting ILD, MO, poor cough mechanics P:   Abx per IM  ENDOCRINE A:   DM  Chronic steroids use P:   Note she is on chronic steroids for ILD SSI Stress steroids for episode of hypotension  NEUROLOGIC A:   Langstaff is a 73 year-old female status post Gamma Knife stereotactic radiosurgery with Dr. Angelene Giovanni and Dr. Morrison Old on March 11, 2012, (12.5 Gy) for left cavernous sinus mass consistent with meningioma. Followed at [facility]. CC was somnolence. P:   RASS goal:1 Currently awake and alert. Interactive. Note she is on MSIR for pain  ? nuero consult for source of somnolence Consider decrease MSIR   FAMILY  - Updates: Daughter updated at bedside  - Inter-disciplinary family meet or Palliative Care meeting due by:  day 7    [facility] Minor ACNP Maryanna Shape PCCM Pager [phone] till 3 pm If no answer page [phone] 04/18/2015, 10:58 AM  Attending Note:  74 year old female with PMH of ILD chronically on steroids for RA who presents to the  hospital with fatigue and SOB. She is usually on 2L [place] at home and now is requiring 4L  for sat of 99%. PCCM was called for hypoxemia and concern for a PE for patient is on heparin. He patient's hypoxemia is less than impressive and can easily be explained by her weight and likely heart failure. On exam, decreased  BS diffusely. I reviewed CXR myself, ?pulmonary edema, difficult to tell with patient's weight. Discussed with TRH-MD and PCCM-NP.  Hypoxemia: likely pulmonary edema and weight related. - Titrate O2 for sat of 88-92%. - Target O2 of 2L. - Continue steroids.  ILD by history, doubt active at this time. - Prednisone at home dose. - Monitor clinically.  ?PE: highly doubtful. - D-dimer. - If positive will consider a d-dimer. - Continue heparin for now.  ?CHF: pulmonary edema on exam and high JVD. - 2D echo. - Check BNP. - Diureses.  Patient seen and examined, agree with above note. I dictated the care and orders written for this patient under my direction.  Rush Farmer, MD [phone]

## 2015-04-18 NOTE — Progress Notes (Signed)
PROGRESS NOTE  [name] JDB:[id number] DOB: [birth year] DOA: 04/17/2015 PCP: Maximino Greenland, MD   HPI: 74 y.o. female with PMH including RA with ILD, obesity with BMI of 50, deconditioning. Patient presents to the ED with fatigue, SOB that suddenly got worse.  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  - LE DVT US pending - empiric heparin - low clinical suspicion, will discuss with PCCM re d/c anticoagulation of DVT US is negative  Sepsis  - although her symptoms of congestion, cough, have been improving on the levaquin and hasnt worsened at all today.  - continue Levaquin  AKI on CKD III - perhaps related to hypotension, improving with gentle hydration on admission - continue to monitor  Acute on chronic resp failure with hypoxia  - increased O2 requirement, see above, likely multifactorial due to perhaps diastolic dysfunction (echo pending), known ILD, obesity hypoventilation - agree with echo  DM2  - SSI AC/HS while on stress dose steroids  HTN  - holding BP meds due to hypotension  CAP  - continue Levaquin as IV for now as this appears to have been responding to treatment from HPI.  Morbid obesity -  encouraged weight loss   Diet: Diet Carb Modified Fluid consistency:: Thin; Room service appropriate?: Yes Fluids: none  DVT Prophylaxis: heparin  Code Status: Full Code Family Communication: no family bedside  Disposition Plan: remain inpatient  Barriers to discharge: hypoxia  Consultants:  PCCM  Procedures:  None    Antibiotics Levaquin 11/29 >>   Studies  Dg Chest 2 View  04/17/2015  CLINICAL DATA:  Chest pain shortness of breath weakness 1 day, cough EXAM: CHEST  2 VIEW COMPARISON:  01/27/2015 FINDINGS: Study limited by body habitus. Cardiac silhouette exaggerated by lordotic positioning likely within normal limits. Mild vascular congestion. Very limited inspiratory effect. Increased opacity at both lung bases. No effusions. IMPRESSION: Bibasilar opacities with very limited lung volumes and limited overall by body habitus. Possibilities include lower lobe atelectasis or pneumonia. Electronically Signed   By: Skipper Cliche M.D.   On: 04/17/2015 16:32    Objective  Filed Vitals:   04/18/15 0755 04/18/15 0800 04/18/15 0900 04/18/15 1000  BP: 124/84 124/76 112/62 107/68  Pulse: 102 102 99 108  Temp: 98 F (36.7 C)     TempSrc: Oral     Resp: _0 Height:      Weight:      SpO2: 96% 95% 97% 98%    Intake/Output Summary (Last 24 hours) at 04/18/15 1108 Last data filed at 04/18/15 0600  Gross per 24 hour  Intake 1636.92 ml  Output      0 ml  Net 1636.92 ml   Filed Weights   04/17/15 1541 04/17/15 2231  Weight: 132.45 kg (292 lb) 132.4 kg (291 lb 14.2 oz)    Exam:  GENERAL: NAD, morbidly obese  HEENT: PERRL, no scleral icterus  NECK: obese  LUNGS: no wheezing, poor exam due to body habitus  HEART: RRR, no murmurs  ABDOMEN: obese, non tender  EXTREMITIES: trace LE edema  NEUROLOGIC: non focal   Data Reviewed: Basic Metabolic Panel:  Recent Labs Lab 04/17/15 1557 04/18/15 0530  NA 141 139  K 3.3* 3.7  CL 101 101  CO2 32 30    GLUCOSE 139* 199*  BUN 25* 22*  CREATININE 2.84* 2.01*  CALCIUM 8.8* 8.2*   Liver Function Tests:  Recent Labs Lab 04/17/15 1557  AST 17  ALT 28  ALKPHOS 51  BILITOT 0.8  PROT 6.0*  ALBUMIN 3.3*   No results for input(s): LIPASE, AMYLASE in the last 168 hours. No results for input(s): AMMONIA in the last 168 hours. CBC:  Recent Labs Lab 04/17/15 1557 04/18/15 0530  WBC 9.3 7.1  HGB 11.9* 10.7*  HCT 39.3 34.9*  MCV 97.8 96.4  PLT 154 147*   Cardiac Enzymes: No results for input(s): CKTOTAL, CKMB, CKMBINDEX, TROPONINI in the last 168 hours. BNP (last 3 results)  Recent Labs  10/30/14 1725 01/27/15 1012  BNP 4.6 6.1    ProBNP (last 3 results)  Recent Labs  06/06/14 1250  PROBNP 18.0    CBG:  Recent Labs Lab 04/18/15 0754  GLUCAP 204*    Recent Results (from the past 240 hour(s))  MRSA PCR Screening     Status: None   Collection Time: 04/17/15 11:35 PM  Result Value Ref Range Status   MRSA by PCR NEGATIVE NEGATIVE Final    Comment:        The GeneXpert MRSA Assay (FDA approved for NASAL specimens only), is one component of a comprehensive MRSA colonization surveillance program. It is not intended to diagnose MRSA infection nor to guide or monitor treatment for MRSA infections.      Scheduled Meds: . allopurinol  200 mg Oral Daily  . aspirin EC  81 mg Oral q morning - 10a  . atorvastatin  10 mg Oral QPM  . DULoxetine  60 mg Oral Daily  . famotidine  20 mg Oral QHS  . gabapentin  600 mg Oral TID  . guaiFENesin  1,200 mg Oral BID  . hydrocortisone sod succinate (SOLU-CORTEF) inj  50 mg Intravenous Q8H  . insulin aspart  0-15 Units Subcutaneous TID WC  . [START ON 04/19/2015] levofloxacin (LEVAQUIN) IV  750 mg Intravenous Q48H  . levothyroxine  50 mcg Oral QAC breakfast  . morphine  30 mg Oral Q12H  . pantoprazole  40 mg Oral Daily   Continuous Infusions: . sodium chloride 125 mL/hr at 04/18/15 0419  . heparin 1,250 Units/hr  (04/18/15 0810)   Marzetta Board, MD [facility] Pager [phone]. If 7 PM - 7 AM, please contact night-coverage at [email or web address], password [email or web address] [place] 04/18/2015, 11:08 AM  LOS: 1 day

## 2015-04-18 NOTE — Progress Notes (Signed)
ANTICOAGULATION CONSULT NOTE - Follow-up Consult  Pharmacy Consult for Heparin Indication: suspected VTE  Allergies  Allergen Reactions  . Codeine Swelling    Facial swelling  . Remicade [Infliximab] Anaphylaxis    "sent me into shock"  . Zestril [Lisinopril] Swelling    Face and neck swelling    Patient Measurements: Height: _0  (162.6 cm) Weight: 291 lb 14.2 oz (132.4 kg) IBW/kg (Calculated) : 54.7 Heparin Dosing Weight: 87 kg  Vital Signs: Temp: 97.8 F (36.6 C) (11/30 1248) Temp Source: Oral (11/30 1248) BP: 123/77 mmHg (11/30 1300) Pulse Rate: 99 (11/30 1300)  Labs:  Recent Labs  04/17/15 1557 04/18/15 0530 04/18/15 1330 04/18/15 1435  HGB 11.9* 10.7*  --   --   HCT 39.3 34.9*  --   --   PLT 154 147*  --   --   HEPARINUNFRC  --  1.00*  --  0.61  CREATININE 2.84* 2.01* 1.69*  --     Estimated Creatinine Clearance: 40.2 mL/min (by C-G formula based on Cr of 1.69).   Assessment: 74yo female on heparin for suspected VTE. Heparin level therapeutic on 1250 units/hr. Hgb down a bit, plt stable. No bleeding noted.   Goal of Therapy:  Heparin level 0.3-0.7 units/ml Monitor platelets by anticoagulation protocol: Yes   Plan:  Continue heparin at 1250 units/hr Daily HL, CBC   Hughes Better, PharmD, BCPS Clinical Pharmacist Pager: [phone] 04/18/2015 3:52 PM

## 2015-04-18 NOTE — Progress Notes (Signed)
Preliminary results by tech - Left Lower Ext. Venous Duplex Completed. No evidence of deep and superficial vein thrombosis in the leg. Oda Cogan, BS, RDMS, RVT

## 2015-04-18 NOTE — Progress Notes (Signed)
ANTICOAGULATION CONSULT NOTE - Follow-up Consult  Pharmacy Consult for Heparin Indication: suspected VTE  Allergies  Allergen Reactions  . Codeine Swelling    Facial swelling  . Remicade [Infliximab] Anaphylaxis    "sent me into shock"  . Zestril [Lisinopril] Swelling    Face and neck swelling    Patient Measurements: Height: _0  (162.6 cm) Weight: 291 lb 14.2 oz (132.4 kg) IBW/kg (Calculated) : 54.7 Heparin Dosing Weight: 87 kg  Vital Signs: Temp: 98.5 F (36.9 C) (11/30 0325) Temp Source: Oral (11/30 0325) BP: 107/72 mmHg (11/30 0325) Pulse Rate: 107 (11/30 0325)  Labs:  Recent Labs  04/17/15 1557 04/18/15 0530  HGB 11.9* 10.7*  HCT 39.3 34.9*  PLT 154 147*  HEPARINUNFRC  --  1.00*  CREATININE 2.84* 2.01*    Estimated Creatinine Clearance: 33.8 mL/min (by C-G formula based on Cr of 2.01).   Assessment: 74yo female on heparin for suspected VTE. Heparin level supratherapeutic on 1500 units/hr. Hgb down a bit, plt stable. No bleeding noted.   Goal of Therapy:  Heparin level 0.3-0.7 units/ml Monitor platelets by anticoagulation protocol: Yes   Plan:  Hold heparin for 1 hour Decrease heparin to 1250 units/hr Will f/u heparin level 8 hours post restart  Sherlon Handing, PharmD, BCPS Clinical pharmacist, pager [phone] 04/18/2015,6:48 AM

## 2015-04-18 NOTE — Progress Notes (Signed)
Attending Note:  74 year old female with PMH of ILD chronically on steroids for RA who presents to the hospital with fatigue and SOB.  She is usually on 2L [place] at home and now is requiring 4L [place] for sat of 99%.  PCCM was called for hypoxemia and concern for a PE for patient is on heparin.  He patient's hypoxemia is less than impressive and can easily be explained by her weight and likely heart failure.  On exam, decreased BS diffusely.  I reviewed CXR myself, ?pulmonary edema, difficult to tell with patient's weight.  Discussed with TRH-MD and PCCM-NP.  Hypoxemia: likely pulmonary edema and weight related.  - Titrate O2 for sat of 88-92%.  - Target O2 of 2L.  - Continue steroids.  ILD by history, doubt active at this time.  - Prednisone at home dose.  - Monitor clinically.  ?PE: highly doubtful.  - D-dimer.  - If positive will consider a d-dimer.  - Continue heparin for now.  ?CHF: pulmonary edema on exam and high JVD.  - 2D echo.  - Check BNP.  - Diureses.  Patient seen and examined, agree with above note.  I dictated the care and orders written for this patient under my direction.  Rush Farmer, MD [phone]

## 2015-04-18 NOTE — Progress Notes (Signed)
Utilization Review Completed.Donne Anon T11/30/2016

## 2015-04-19 ENCOUNTER — Encounter (HOSPITAL_COMMUNITY): Payer: Medicare Other

## 2015-04-19 ENCOUNTER — Ambulatory Visit (HOSPITAL_COMMUNITY): Payer: Medicare Other

## 2015-04-19 DIAGNOSIS — R0600 Dyspnea, unspecified: Secondary | ICD-10-CM

## 2015-04-19 DIAGNOSIS — N189 Chronic kidney disease, unspecified: Secondary | ICD-10-CM

## 2015-04-19 DIAGNOSIS — N179 Acute kidney failure, unspecified: Secondary | ICD-10-CM

## 2015-04-19 DIAGNOSIS — I9589 Other hypotension: Secondary | ICD-10-CM

## 2015-04-19 LAB — BASIC METABOLIC PANEL
ANION GAP: 8 (ref 5–15)
BUN: 23 mg/dL — ABNORMAL HIGH (ref 6–20)
CHLORIDE: 100 mmol/L — AB (ref 101–111)
CO2: 28 mmol/L (ref 22–32)
CREATININE: 1.35 mg/dL — AB (ref 0.44–1.00)
Calcium: 8.2 mg/dL — ABNORMAL LOW (ref 8.9–10.3)
GFR calc non Af Amer: 38 mL/min — ABNORMAL LOW (ref >60)
GFR, EST AFRICAN AMERICAN: 44 mL/min — AB (ref >60)
Glucose, Bld: 201 mg/dL — ABNORMAL HIGH (ref 65–99)
POTASSIUM: 3.7 mmol/L (ref 3.5–5.1)
SODIUM: 136 mmol/L (ref 135–145)

## 2015-04-19 LAB — CBC
HEMATOCRIT: 34.6 % — AB (ref 36.0–46.0)
Hemoglobin: 11 g/dL — ABNORMAL LOW (ref 12.0–15.0)
MCH: 30.2 pg (ref 26.0–34.0)
MCHC: 31.8 g/dL (ref 30.0–36.0)
MCV: 95.1 fL (ref 78.0–100.0)
PLATELETS: 149 10*3/uL — AB (ref 150–400)
RBC: 3.64 MIL/uL — ABNORMAL LOW (ref 3.87–5.11)
RDW: 15 % (ref 11.5–15.5)
WBC: 7.5 10*3/uL (ref 4.0–10.5)

## 2015-04-19 LAB — HEPARIN LEVEL (UNFRACTIONATED): Heparin Unfractionated: 0.56 IU/mL (ref 0.30–0.70)

## 2015-04-19 LAB — GLUCOSE, CAPILLARY
GLUCOSE-CAPILLARY: 170 mg/dL — AB (ref 65–99)
GLUCOSE-CAPILLARY: 152 mg/dL — AB (ref 65–99)
Glucose-Capillary: 198 mg/dL — ABNORMAL HIGH (ref 65–99)

## 2015-04-19 LAB — SEDIMENTATION RATE: SED RATE: 41 mm/h — AB (ref 0–22)

## 2015-04-19 MED ORDER — GABAPENTIN 600 MG PO TABS
1800.0000 mg | ORAL_TABLET | Freq: Every day | ORAL | Status: DC
  Administered 2015-04-19 – 2015-04-21 (×3): 1800 mg via ORAL
  Filled 2015-04-19 (×3): qty 3

## 2015-04-19 MED ORDER — FUROSEMIDE 10 MG/ML IJ SOLN
40.0000 mg | Freq: Two times a day (BID) | INTRAMUSCULAR | Status: AC
  Administered 2015-04-19 – 2015-04-20 (×4): 40 mg via INTRAVENOUS
  Filled 2015-04-19 (×3): qty 4

## 2015-04-19 MED ORDER — PERFLUTREN PROTEIN A MICROSPH IV SUSP: 0.5000 mL | Freq: Once | INTRAVENOUS | Status: DC

## 2015-04-19 MED ORDER — PERFLUTREN LIPID MICROSPHERE
INTRAVENOUS | Status: AC
  Filled 2015-04-19: qty 10

## 2015-04-19 MED ORDER — PERFLUTREN LIPID MICROSPHERE
1.0000 mL | INTRAVENOUS | Status: AC | PRN
  Administered 2015-04-19: 4 mL via INTRAVENOUS
  Filled 2015-04-19: qty 10

## 2015-04-19 MED ORDER — FUROSEMIDE 10 MG/ML IJ SOLN
INTRAMUSCULAR | Status: AC
  Filled 2015-04-19: qty 4

## 2015-04-19 MED ORDER — GABAPENTIN 600 MG PO TABS
1200.0000 mg | ORAL_TABLET | Freq: Two times a day (BID) | ORAL | Status: DC
  Administered 2015-04-19 – 2015-04-22 (×7): 1200 mg via ORAL
  Filled 2015-04-19 (×8): qty 2

## 2015-04-19 MED ORDER — GABAPENTIN 600 MG PO TABS: 1200.0000 mg | ORAL_TABLET | Freq: Two times a day (BID) | ORAL | Status: DC

## 2015-04-19 NOTE — Progress Notes (Signed)
Report received from [place], [place] for transfer to [phone]

## 2015-04-19 NOTE — Care Management Note (Signed)
Case Management Note  Patient Details  Name: DETRICE CALES MRN: [id number] Date of Birth: 05-31-1940  Subjective/Objective:   Pt lives with spouse, has cane, walker, rollator, BSC, and shower chair.  State she has been to Inpatient Rehab previously and feels she may need some rehab before returning home but feels she may tolerate rehab @ SNF as she feels more deconditioned than before.  PT eval pending.                              Expected Discharge Plan:  Skilled Nursing Facility  In-House Referral:  Clinical Social Work  Discharge planning Services  CM Consult  Status of Service:  In process, will continue to follow  Girard Cooter, RN 04/19/2015, 12:36 PM

## 2015-04-19 NOTE — Progress Notes (Addendum)
NURSING PROGRESS NOTE  [name] [id number] Transfer Data: 04/19/2015 4:39 PM Attending Provider: Caren Griffins, MD YQM:GNOIBBC,WUGQB N, MD Code Status: FULL  [name] is a 74 y.o. female patient transferred from [place] -No acute distress noted.  -No complaints of shortness of breath.  -No complaints of chest pain.   Cardiac Monitoring: Box # 08 in place. Cardiac monitor yields:normal sinus rhythm.  Blood pressure 146/80, pulse 98, temperature 97.4 F (36.3 C), temperature source Oral, resp. rate 16, height _0  (1.626 m), weight 132.4 kg (291 lb 14.2 oz), SpO2 96 %.   IV Fluids:  IV in place, occlusive dsg intact without redness, IV cath forearm right, condition patent and no redness none.   Allergies:  Codeine; Remicade; and Zestril  Past Medical History:   has a past medical history of Diabetes mellitus; Hypertension; Thyroid disease; Arthritis; High cholesterol; 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 ([place]) (02/17/2012); Depression, reactive; RA (rheumatoid arthritis) ([place]); Contusion of left knee; URI (upper respiratory infection); Clotting disorder ([place]); Asthma; Carotid artery occlusion; Interstitial lung disease ([place]); Morbid obesity ([place]); Shortness of breath; Family history of heart disease; Chronic kidney disease; COPD (chronic obstructive pulmonary disease) ([place]); Normal coronary arteries; and Diastolic dysfunction.  Past Surgical History:   has past surgical history that includes Abdominal hysterectomy; [place] surgery; Shoulder surgery (Left); Brain surgery; Video bronchoscopy (Bilateral, 05/31/2013); video bronscoscopy (2000); Tonsillectomy (age 69); Esophagogastroduodenoscopy (egd) with propofol (N/A, 09/14/2014); and Cardiac catheterization (N/A, 10/31/2014).  Social History:   reports that she has never smoked. She has never used smokeless tobacco. She reports that she does not drink alcohol or use illicit drugs.  Skin:  intact  Patient/Family orientated to room. Information packet given to patient/family. Admission inpatient armband information verified with patient/family to include name and date of birth and placed on patient arm. Side rails up x 2, fall assessment and education completed with patient/family. Patient/family able to verbalize understanding of risk associated with falls and verbalized understanding to call for assistance before getting out of bed. Call light within reach. Patient/family able to voice and demonstrate understanding of unit orientation instructions.    Dr. Renne Crigler paged to clarify patient's dosing of gabapentin. Patient states she takes 1268m in am, 1200 mg in afternoon and 18017mbefore bedtime. Orders placed

## 2015-04-19 NOTE — Consult Note (Signed)
PULMONARY / CRITICAL CARE MEDICINE   Name: [name] MRN: [id number] DOB: 05-07-1941    ADMISSION DATE:  04/17/2015 CONSULTATION DATE:  11/30 REFERRING MD : [facility]  CHIEF COMPLAINT:  AMS  HISTORY OF PRESENT ILLNESS:   74 MO(291 lbs) AAF with known ILD/RA followed by Dr. Brantley Persons (last visit 04/16/15) who has a known meningoma (sterotatic gamma knife 2013) and followed at [facility] last seen 2015. She is on chronic steroids for RA /ILD and was noted to be somnolent and difficult to arouse. Transported to [facility] and treated for hypoxia and hypotension and suspected PE treated with empiric heparin. She is awake and alert, O2 sats 99% on 4 l [place]. She is in no real distress.  I suspect her MSIR may be contributing to her decreased LOC.  Doubt she has PE.  SUBJECTIVE: neg 800 cc  VITAL SIGNS: BP 138/86 mmHg  Pulse 93  Temp(Src) 98 F (36.7 C) (Oral)  Resp 14  Ht _0  (1.626 m)  Wt 132.4 kg (291 lb 14.2 oz)  BMI 50.08 kg/m2  SpO2 100%  HEMODYNAMICS:    VENTILATOR SETTINGS:    INTAKE / OUTPUT: I/O last 3 completed shifts: In: 2104.8 [I.V.:2104.8] Out: 1450 [Urine:1450]  PHYSICAL EXAMINATION: General:  MOAAF NAD at rest Neuro:  Intact, follows commands, speech clear HEENT: No jvd, no neck Cardiovascular:  HSD RRR Lungs:  Decreased bs bases Abdomen:obese Musculoskeletal: intact Skin:  Warm and dry  LABS:  CBC  Recent Labs Lab 04/17/15 1557 04/18/15 0530 04/19/15 0320  WBC 9.3 7.1 7.5  HGB 11.9* 10.7* 11.0*  HCT 39.3 34.9* 34.6*  PLT 154 147* 149*   Coag's No results for input(s): APTT, INR in the last 168 hours. BMET  Recent Labs Lab 04/18/15 0530 04/18/15 1330 04/19/15 0320  NA 139 138 136  K 3.7 3.6 3.7  CL 101 102 100*  CO2 _1 BUN 22* 21* 23*  CREATININE 2.01* 1.69* 1.35*  GLUCOSE 199* 142* 201*   Electrolytes  Recent Labs Lab 04/18/15 0530 04/18/15 1330 04/19/15 0320  CALCIUM 8.2* 8.0* 8.2*   Sepsis Markers  Recent Labs Lab  04/17/15 1606  LATICACIDVEN 1.57   ABG  Recent Labs Lab 04/17/15 1819  PHART 7.405  PCO2ART 48.4*  PO2ART 79.0*   Liver Enzymes  Recent Labs Lab 04/17/15 1557  AST 17  ALT 28  ALKPHOS 51  BILITOT 0.8  ALBUMIN 3.3*   Cardiac Enzymes No results for input(s): TROPONINI, PROBNP in the last 168 hours. Glucose  Recent Labs Lab 04/18/15 0754 04/18/15 1240 04/18/15 1700 04/18/15 2233 04/19/15 0809  GLUCAP 204* 150* 176* 148* 170*    Imaging No results found.   STUDIES: \ Doppler neg pre lim   CULTURES:   ANTIBIOTICS: 11/29 levaquin>>  SIGNIFICANT EVENTS:   LINES/TUBES:A/P  74 year old female with PMH of ILD chronically on steroids for RA who presents to the hospital with fatigue and SOB.  She is usually on 2L [place] at home and now is requiring 4L [place] for sat of 99%.  PCCM was called for hypoxemia and concern for a PE for patient is on heparin.  He patient's hypoxemia is less than impressive and can easily be explained by her weight and likely heart failure.    Hypoxemia: likely pulmonary edema  ILD by history, doubt active at this time.  Appearing likely as edema, add lasix q12h IV to neg balance further unlikely ILD exacerbation however will assess esr, c3 c4, rf,  and auto immune activity With O2 needs increased and remains on home O2, i wonder as outpt if she is a candidate for open lung bx Would assess echo and assess pa pressures which were normal in last echo She feels better, would likely continues to feel better with further lasix She reports ineffective lasix dosing in past Low clinical suspicion pe, and doppler neg, dc heparin iv Move to tele May need [place] abdo, reports some ruq pain  D/w [facility]  Lavon Paganini. Titus Mould, MD, FACP Pgr: [place]. Titus Mould, MD, [place] Pgr: [facility]

## 2015-04-19 NOTE — Progress Notes (Signed)
Attempted to call [place] to give report. Nurse will call back to receive report.

## 2015-04-19 NOTE — Progress Notes (Signed)
PROGRESS NOTE  [name] XTG:[id number] DOB: 04/28/1941 DOA: 04/17/2015 PCP: Maximino Greenland, MD   HPI: 74 y.o. female with PMH including RA with ILD, obesity with BMI of 50, deconditioning. Patient presents to the ED with fatigue, SOB that suddenly got worse.  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 Discussed with PCCM. LE DVT US negative  Hypotension  - on admission, concern for adrenal insufficiency given missed steroids dose and chronic use - continue IV steroids, transition to Prednisone in am   Sepsis  - although her symptoms of congestion, cough, have been improving on the levaquin and hasnt worsened at all today.  - continue Levaquin  AKI on CKD III - perhaps related to hypotension, improved with gentle hydration on admission - continue to monitor, watch closely while getting Lasix  DM2  - SSI AC/HS while on steroids  HTN  - holding BP meds due to hypotension  CAP  - continue Levaquin as IV for now as this appears to have been responding to treatment from HPI.  Morbid obesity - encouraged weight loss   Diet: Diet  Carb Modified Fluid consistency:: Thin; Room service appropriate?: Yes Fluids: none  DVT Prophylaxis: heparin  Code Status: Full Code Family Communication: no family bedside  Disposition Plan: remain inpatient  Barriers to discharge: hypoxia  Consultants:  PCCM  Procedures:  None    Antibiotics Levaquin 11/29 >>   Studies  No results found.  Objective  Filed Vitals:   04/19/15 0043 04/19/15 0401 04/19/15 0800 04/19/15 1300  BP: 140/83 141/84 138/86 146/80  Pulse: 104 100 93 98  Temp: 97.6 F (36.4 C) 97.3 F (36.3 C) 98 F (36.7 C) 97.4 F (36.3 C)  TempSrc: Oral Axillary Oral Oral  Resp: _0 Height:      Weight:      SpO2: 99% 100% 100% 96%    Intake/Output Summary (Last 24 hours) at 04/19/15 1638 Last data filed at 04/19/15 1607  Gross per 24 hour  Intake 1155.42 ml  Output   2900 ml  Net -1744.58 ml   Filed Weights   04/17/15 1541 04/17/15 2231  Weight: 132.45 kg (292 lb) 132.4 kg (291 lb 14.2 oz)    Exam:  GENERAL: NAD, morbidly obese  HEENT: PERRL, no scleral icterus  NECK: obese  LUNGS: no wheezing, poor exam due to body habitus  HEART: RRR, no murmurs  ABDOMEN: obese, non tender  EXTREMITIES: trace LE edema  NEUROLOGIC: non focal   Data Reviewed: Basic Metabolic Panel:  Recent Labs Lab 04/17/15 1557 04/18/15 0530 04/18/15 1330 04/19/15 0320  NA 141 139 138 136  K 3.3* 3.7 3.6 3.7  CL 101 101 102 100*  CO2 32 _0 GLUCOSE 139* 199* 142* 201*  BUN 25* 22* 21* 23*  CREATININE 2.84* 2.01* 1.69* 1.35*  CALCIUM 8.8* 8.2* 8.0* 8.2*   Liver Function Tests:  Recent Labs Lab 04/17/15 1557  AST 17  ALT 28  ALKPHOS 51  BILITOT 0.8  PROT 6.0*  ALBUMIN 3.3*   No results for input(s): LIPASE, AMYLASE in the last 168 hours. No results for input(s): AMMONIA in the last 168 hours. CBC:  Recent Labs Lab 04/17/15 1557 04/18/15 0530 04/19/15 0320  WBC 9.3 7.1 7.5  HGB 11.9* 10.7* 11.0*  HCT 39.3 34.9*  34.6*  MCV 97.8 96.4 95.1  PLT 154 147* 149*   Cardiac Enzymes: No results for input(s): CKTOTAL, CKMB, CKMBINDEX, TROPONINI in the last 168 hours. BNP (last 3 results)  Recent Labs  10/30/14 1725 01/27/15 1012 04/18/15 1330  BNP 4.6 6.1 9.9    ProBNP (last 3 results)  Recent Labs  06/06/14 1250  PROBNP 18.0    CBG:  Recent Labs Lab 04/18/15 1240 04/18/15 1700 04/18/15 2233 04/19/15 0809 04/19/15 1237  GLUCAP 150* 176* 148* 170* 152*    Recent Results (from the past 240 hour(s))  MRSA PCR Screening     Status: None   Collection Time: 04/17/15 11:35 PM  Result Value Ref Range Status   MRSA by PCR NEGATIVE NEGATIVE Final    Comment:        The GeneXpert MRSA Assay (FDA approved for NASAL specimens only), is one component of a comprehensive MRSA colonization surveillance program. It is not intended to diagnose MRSA infection nor to guide or monitor treatment for MRSA infections.      Scheduled Meds: . allopurinol  200 mg Oral Daily  . aspirin EC  81 mg Oral q morning - 10a  . atorvastatin  10 mg Oral QPM  . DULoxetine  60 mg Oral Daily  . famotidine  20 mg Oral QHS  . furosemide      . furosemide  40 mg Intravenous BID  . [START ON 04/20/2015] gabapentin  1,200 mg Oral BID  . gabapentin  1,800 mg Oral QHS  . guaiFENesin  1,200 mg Oral BID  . hydrocortisone sod succinate (SOLU-CORTEF) inj  50 mg Intravenous Q8H  . insulin aspart  0-15 Units Subcutaneous TID WC  . levofloxacin (LEVAQUIN) IV  750 mg Intravenous Q48H  . levothyroxine  50 mcg Oral QAC breakfast  . morphine  30 mg Oral Q12H  . pantoprazole  40 mg Oral Daily   Continuous Infusions:   Marzetta Board, MD [facility] Pager [phone]. If 7 PM - 7 AM, please contact night-coverage at [email or web address], password [facility] 04/19/2015, 4:38 PM  LOS: 2 days

## 2015-04-19 NOTE — Progress Notes (Signed)
  Echocardiogram Echocardiogram Transesophageal has been performed.  [name] 04/19/2015, 3:55 PM

## 2015-04-19 NOTE — Progress Notes (Signed)
ANTICOAGULATION CONSULT NOTE - Follow-up Consult  Pharmacy Consult for Heparin Indication: suspected VTE  Allergies  Allergen Reactions  . Codeine Swelling    Facial swelling  . Remicade [Infliximab] Anaphylaxis    "sent me into shock"  . Zestril [Lisinopril] Swelling    Face and neck swelling    Patient Measurements: Height: 5' 4" (162.6 cm) Weight: 291 lb 14.2 oz (132.4 kg) IBW/kg (Calculated) : 54.7 Heparin Dosing Weight: 87 kg  Vital Signs: Temp: 98 F (36.7 C) (12/01 0800) Temp Source: Oral (12/01 0800) BP: 138/86 mmHg (12/01 0800) Pulse Rate: 93 (12/01 0800)  Labs:  Recent Labs  04/17/15 1557 04/18/15 0530 04/18/15 1330 04/18/15 1435 04/19/15 0320  HGB 11.9* 10.7*  --   --  11.0*  HCT 39.3 34.9*  --   --  34.6*  PLT 154 147*  --   --  149*  HEPARINUNFRC  --  1.00*  --  0.61 0.56  CREATININE 2.84* 2.01* 1.69*  --  1.35*    Estimated Creatinine Clearance: 50.3 mL/min (by C-G formula based on Cr of 1.35).   Assessment: 74 yo female on heparin for suspected VTE. Heparin level therapeutic on 1250 units/hr. CBC stable. Lower extremity dopplers negative for DVT. D-dimer elevated.   Goal of Therapy:  Heparin level 0.3-0.7 units/ml Monitor platelets by anticoagulation protocol: Yes   Plan:  Continue heparin at 1250 units/hr Daily HL, CBC Monitor s/sx bleeding   Hughes Better, PharmD, BCPS Clinical Pharmacist Pager: [phone] 04/19/2015 8:34 AM

## 2015-04-20 LAB — PHOSPHORUS: PHOSPHORUS: 2.7 mg/dL (ref 2.5–4.6)

## 2015-04-20 LAB — BASIC METABOLIC PANEL
Anion gap: 11 (ref 5–15)
BUN: 18 mg/dL (ref 6–20)
CHLORIDE: 96 mmol/L — AB (ref 101–111)
CO2: 32 mmol/L (ref 22–32)
CREATININE: 1.23 mg/dL — AB (ref 0.44–1.00)
Calcium: 8.3 mg/dL — ABNORMAL LOW (ref 8.9–10.3)
GFR calc Af Amer: 49 mL/min — ABNORMAL LOW (ref >60)
GFR calc non Af Amer: 42 mL/min — ABNORMAL LOW (ref >60)
Glucose, Bld: 132 mg/dL — ABNORMAL HIGH (ref 65–99)
Potassium: 3.2 mmol/L — ABNORMAL LOW (ref 3.5–5.1)
SODIUM: 139 mmol/L (ref 135–145)

## 2015-04-20 LAB — RHEUMATOID FACTOR: RHEUMATOID FACTOR: 422.8 [IU]/mL — AB (ref 0.0–13.9)

## 2015-04-20 LAB — C3 COMPLEMENT: C3 COMPLEMENT: 163 mg/dL (ref 82–167)

## 2015-04-20 LAB — CBC
HCT: 34.4 % — ABNORMAL LOW (ref 36.0–46.0)
HEMOGLOBIN: 10.6 g/dL — AB (ref 12.0–15.0)
MCH: 29.4 pg (ref 26.0–34.0)
MCHC: 30.8 g/dL (ref 30.0–36.0)
MCV: 95.3 fL (ref 78.0–100.0)
PLATELETS: 159 10*3/uL (ref 150–400)
RBC: 3.61 MIL/uL — ABNORMAL LOW (ref 3.87–5.11)
RDW: 15.3 % (ref 11.5–15.5)
WBC: 7.3 10*3/uL (ref 4.0–10.5)

## 2015-04-20 LAB — MAGNESIUM: Magnesium: 1.5 mg/dL — ABNORMAL LOW (ref 1.7–2.4)

## 2015-04-20 LAB — GLUCOSE, CAPILLARY
GLUCOSE-CAPILLARY: 174 mg/dL — AB (ref 65–99)
GLUCOSE-CAPILLARY: 201 mg/dL — AB (ref 65–99)
GLUCOSE-CAPILLARY: 193 mg/dL — AB (ref 65–99)
Glucose-Capillary: 170 mg/dL — ABNORMAL HIGH (ref 65–99)

## 2015-04-20 LAB — C4 COMPLEMENT: Complement C4, Body Fluid: 32 mg/dL (ref 14–44)

## 2015-04-20 MED ORDER — LEVOFLOXACIN 750 MG PO TABS
750.0000 mg | ORAL_TABLET | Freq: Every evening | ORAL | Status: DC
  Administered 2015-04-20 – 2015-04-21 (×2): 750 mg via ORAL
  Filled 2015-04-20 (×2): qty 1

## 2015-04-20 MED ORDER — PREDNISONE 20 MG PO TABS
40.0000 mg | ORAL_TABLET | Freq: Every day | ORAL | Status: DC
  Administered 2015-04-20 – 2015-04-22 (×3): 40 mg via ORAL
  Filled 2015-04-20 (×3): qty 2

## 2015-04-20 NOTE — Progress Notes (Signed)
Pt had a brief episode of VTach.  Pt c/o of midsternal chest discomfort.  Did not appear to be in distress.  VSS.  Paged on-call team.  Will continue to monitor.

## 2015-04-20 NOTE — Clinical Social Work Note (Signed)
Clinical Social Work Assessment  Patient Details  Name: [name] MRN: [id number] Date of Birth: 06/07/1940  Date of referral:  04/20/15               Reason for consult:  Facility Placement                Permission sought to share information with:  Facility Sport and exercise psychologist, Family Supports Permission granted to share information::  Yes, Verbal Permission Granted  Name::     Mikeal Hawthorne  Agency::  [facility] SNF's  Relationship::  Husband  Contact Information:  [phone]  Housing/Transportation Living arrangements for the past 2 months:  Single Family Home Source of Information:  Patient Patient Interpreter Needed:  None Criminal Activity/Legal Involvement Pertinent to Current Situation/Hospitalization:  No - Comment as needed Significant Relationships:  Adult Children, Spouse Lives with:  Spouse Do you feel safe going back to the place where you live?  No Need for family participation in patient care:  Yes (Comment)  Care giving concerns:  CSW received referral for possible SNF placement at time of discharge. CSW met with patient and patient's husband at bedside regarding PT recommendation of SNF placement at time of discharge. Per patient, patient is currently afraid to go home given patient's current medical needs. Patient and patient's husband expressed understanding of PT recommendation and are agreeable to SNF placement at time of discharge. In the event that the patient feels she can go home w/ home health, she would rather be at home than a SNF. CSW to continue to follow and assist with discharge planning needs.   Social Worker assessment / plan:  CSW spoke with patient and patient's husband concerning possibility of rehab at [facility] before returning home. Patient provided CSW w/ permission to send out FL2 in the event patient decides to go to SNF.   Employment status:  Retired Forensic scientist:  Medicare PT Recommendations:  [place] / Referral to community resources:  [place]  Patient/Family's Response to care:  Patient and patient's husband recognize need for rehab before returning home and may be agreeable to a SNF in [facility] if the patient is still feeling weak upon discharge. Patient would rather go home w/ home health but is currently unsure.  Patient/Family's Understanding of and Emotional Response to Diagnosis, Current Treatment, and Prognosis:  Patient is realistic regarding therapy needs. No questions/concerns about plan or treatment.    Emotional Assessment Appearance:  Appears stated age Attitude/Demeanor/Rapport:    Affect (typically observed):  Accepting, Appropriate, Anxious Orientation:  Oriented to Self, Oriented to Place, Oriented to  Time, Oriented to Situation Alcohol / Substance use:  Not Applicable Psych involvement (Current and /or in the community):  No (Comment)  Discharge Needs  Concerns to be addressed:  Care Coordination Readmission within the last 30 days:  No Current discharge risk:  None Barriers to Discharge:  Continued Medical Work up   [employer], LCSW 04/20/2015, 5:25 PM

## 2015-04-20 NOTE — NC FL2 (Signed)
[place] MEDICAID FL2 LEVEL OF CARE SCREENING TOOL     IDENTIFICATION  Patient Name: [name] Birthdate: 1941/05/07 Sex: female Admission Date (Current Location): 04/17/2015  [facility] [place] Number: Herbalist and Address:  The . [facility], [place] [address] [place], [place] [address]      Provider Number: [id number]  Attending Physician Name and Address:  Caren Griffins, MD  Relative Name and Phone Number:  Kristine Linea [phone]    Current Level of Care: Hospital Recommended Level of Care: [place] Prior Approval Number:    Date Approved/Denied:   PASRR Number: [id number]  Discharge Plan: SNF    Current Diagnoses: Patient Active Problem List   Diagnosis Date Noted  . Acute pulmonary edema (HCC)   . AKI (acute kidney injury) ([place])   . Peripheral edema   . Acute on chronic respiratory failure with hypoxia ([place]) 04/17/2015  . CAP (community acquired pneumonia) 04/17/2015  . Acute kidney injury superimposed on chronic kidney disease ([place]) 04/17/2015  . Hypotension 04/17/2015  . Chronic respiratory failure with hypoxia ([place]) 02/09/2015  . Dyspnea and respiratory abnormality 01/03/2015  . Normal coronary arteries 11/02/2014  . Morbid obesity ([place]) 10/30/2014  . Dysphagia 08/21/2014  . Sore throat 07/18/2014  . Chronic fatigue 06/21/2014  . DOE (dyspnea on exertion) 05/24/2014  . Primary osteoarthritis of left knee 04/26/2014  . High risk medication use 04/17/2014  . Aphasia 02/12/2014  . 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 Lumbar facet arthropathy 01/18/2014  . Acute bronchitis 07/24/2013  . Bilateral edema of lower extremity 05/30/2013  . Chronic cough 05/24/2013  . ILD (interstitial lung disease) ([place]) 04/10/2013  . Greater trochanteric bursitis of left hip 08/13/2012  . Spinal stenosis of lumbar region with radiculopathy 07/12/2012  . Inability to walk  07/12/2012  . Acute lumbar back pain 07/12/2012  . Asymptomatic bacteriuria 07/12/2012  . 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 Benign neoplasm of meninges ([place]) 02/12/2012  . Brain mass 01/28/2012  . Chest pain with moderate risk of acute coronary syndrome 01/28/2012  . Rheumatoid arthritis ([place]) 01/28/2012  . Primary osteoarthritis of right knee 01/28/2012  . Dyslipidemia   . Thyroid disease   . Essential hypertension     Orientation ACTIVITIES/SOCIAL BLADDER RESPIRATION    Self, Time, Situation, Place  Family supportive Continent, Indwelling catheter O2 (As needed) (Nasal Cannula, 2L/min)  BEHAVIORAL SYMPTOMS/MOOD NEUROLOGICAL BOWEL NUTRITION STATUS      Continent  (Carb modified. See DC summary.)  PHYSICIAN VISITS COMMUNICATION OF NEEDS Height & Weight Skin    Verbally _0  (162.6 cm) 302 lbs. Normal          AMBULATORY STATUS RESPIRATION    Assist extensive O2 (As needed) (Nasal Cannula, 2L/min)      Personal Care Assistance Level of Assistance  Bathing, Feeding, Dressing Bathing Assistance: Limited assistance Feeding assistance: Independent Dressing Assistance: Limited assistance      Functional Limitations Info                SPECIAL CARE FACTORS FREQUENCY  PT (By licensed PT)     PT Frequency: 5x/week             Additional Factors Info  Code Status, Allergies Code Status Info: Full Allergies Info: Codeine, Remicade, Zestril           Current Medications (04/20/2015):  This is the current hospital active medication list  Current Facility-Administered Medications  Medication Dose Route Frequency Provider Last Rate Last Dose  . albuterol (PROVENTIL) (2.5 MG/3ML) 0.083% nebulizer solution 2.5 mg  2.5 mg Nebulization Q6H PRN Etta Quill, DO      . allopurinol (ZYLOPRIM) tablet 200 mg  200 mg Oral Daily Etta Quill, DO   200 mg at 04/20/15 1059  . aspirin EC tablet 81 mg  81 mg Oral q morning - 10a Etta Quill, DO   81 mg at 04/20/15 1058  . atorvastatin (LIPITOR) tablet 10 mg  10 mg Oral QPM Etta Quill, DO   10 mg at 04/19/15 1730  . colchicine tablet 0.6 mg  0.6 mg Oral Daily PRN Etta Quill, DO      . DULoxetine (CYMBALTA) DR capsule 60 mg  60 mg Oral Daily Etta Quill, DO   60 mg at 04/20/15 1059  . famotidine (PEPCID) tablet 20 mg  20 mg Oral QHS Etta Quill, DO   20 mg at 04/19/15 2123  . furosemide (LASIX) injection 40 mg  40 mg Intravenous BID Raylene Miyamoto, MD   40 mg at 04/20/15 0836  . gabapentin (NEURONTIN) tablet 1,200 mg  1,200 mg Oral BID Caren Griffins, MD   1,200 mg at 04/20/15 1058  . gabapentin (NEURONTIN) tablet 1,800 mg  1,800 mg Oral QHS Caren Griffins, MD   1,800 mg at 04/19/15 2123  . guaiFENesin (MUCINEX) 12 hr tablet 1,200 mg  1,200 mg Oral BID Etta Quill, DO   1,200 mg at 04/20/15 1331  . insulin aspart (novoLOG) injection 0-15 Units  0-15 Units Subcutaneous TID WC Etta Quill, DO   5 Units at 04/20/15 1332  . levofloxacin (LEVAQUIN) tablet 750 mg  750 mg Oral QPM Donalynn [place], RPH      . levothyroxine (SYNTHROID, LEVOTHROID) tablet 50 mcg  50 mcg Oral QAC breakfast Etta Quill, DO   50 mcg at 04/20/15 0805  . morphine (MS CONTIN) 12 hr tablet 30 mg  30 mg Oral Q12H Etta Quill, DO   30 mg at 04/20/15 1059  . morphine (MSIR) tablet 7.5-15 mg  7.5-15 mg Oral Q6H PRN Etta Quill, DO      . pantoprazole (PROTONIX) EC tablet 40 mg  40 mg Oral Daily Etta Quill, DO   40 mg at 04/20/15 1059  . predniSONE (DELTASONE) tablet 40 mg  40 mg Oral Q breakfast Caren Griffins, MD   40 mg at 04/20/15 1331     Discharge Medications: Please see discharge summary for a list of discharge medications.  Relevant Imaging Results:  Relevant Lab Results:  Recent Labs    Additional [place], LCSW

## 2015-04-20 NOTE — Care Management Important Message (Signed)
Important Message  Patient Details  Name: [name] MRN: [id number] Date of Birth: Nov 28, 1940   Medicare Important Message Given:  Yes    Kierre Deines P Starlene Consuegra 04/20/2015, 2:17 PM

## 2015-04-20 NOTE — Progress Notes (Signed)
PROGRESS NOTE  [name] MAU:[id number] DOB: Sep 09, 1940 DOA: 04/17/2015 PCP: Maximino Greenland, MD   HPI: 74 y.o. female with PMH including RA with ILD, obesity with BMI of 50, deconditioning. Patient presents to the ED with fatigue, SOB that suddenly got worse.  Subjective / 24 H Interval events - feels better with her breathing   Assessment/Plan: Principal Problem:   Hypotension Active Problems:   Essential hypertension   Rheumatoid arthritis (HCC)   ILD (interstitial lung disease) (HCC)   Bilateral edema of lower extremity   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)   Acute on chronic respiratory failure with hypoxia (HCC)   CAP (community acquired pneumonia)   Acute kidney injury superimposed on chronic kidney disease ([place])   Acute pulmonary edema (HCC)   AKI (acute kidney injury) ([place])   Peripheral edema  Acute on chronic resp failure with hypoxia  - increased O2 requirement, see above, likely multifactorial due to diastolic dysfunction, known ILD, obesity hypoventilation - echo with normal EF and diastolic dysfunction - IV lasix for now - low clinical suspicion for PE, d/cd heparin. Discussed with PCCM. LE DVT US negative - SNF placement pending, discussed with SW  Hypotension  - on admission, concern for adrenal insufficiency given missed steroids dose and chronic use - transitioned to Prednisone  Sepsis  - although her symptoms of congestion, cough, have been improving on the levaquin and hasnt worsened at all today.  - continue Levaquin, no changes today   AKI on CKD III - perhaps related to hypotension, improved with gentle hydration on admission - continue to monitor, watch closely while getting Lasix - renal function improving to 1.23 today, continue to monitor.   DM2  - SSI AC/HS while on steroids - fasting CBG 172 this morning, on steroids, suspect improvement with taper, will not change insulin regimen.   HTN  - holding BP  meds due to hypotension  CAP  - continue Levaquin as IV for now as this appears to have been responding to treatment from HPI.  Morbid obesity - encouraged weight loss   Diet: Diet Carb Modified Fluid consistency:: Thin; Room service appropriate?: Yes Fluids: none  DVT Prophylaxis: heparin  Code Status: Full Code Family Communication: no family bedside  Disposition Plan: remain inpatient  Barriers to discharge: hypoxia  Consultants:  PCCM  Procedures:  None    Antibiotics Levaquin 11/29 >>   Studies  No results found.  Objective  Filed Vitals:   04/19/15 1300 04/19/15 1649 04/19/15 2243 04/20/15 0548  BP: 146/80 138/107 103/54 133/78  Pulse: 98 98 103 108  Temp: 97.4 F (36.3 C)  98.6 F (37 C) 98.3 F (36.8 C)  TempSrc: Oral  Oral Oral  Resp: _0 Height:  _1  (1.626 m)    Weight:  137.122 kg (302 lb 4.8 oz)    SpO2: 96% 100% 98% 99%    Intake/Output Summary (Last 24 hours) at 04/20/15 1336 Last data filed at 04/20/15 0548  Gross per 24 hour  Intake  132.5 ml  Output   2400 ml  Net -2267.5 ml   Filed Weights   04/17/15 1541 04/17/15 2231 04/19/15 1649  Weight: 132.45 kg (292 lb) 132.4 kg (291 lb 14.2 oz) 137.122 kg (302 lb 4.8 oz)    Exam:  GENERAL: NAD, morbidly obese  HEENT: PERRL, no scleral icterus  NECK: obese  LUNGS: no wheezing, poor exam due to body habitus  HEART: RRR, no murmurs  ABDOMEN: obese, non tender  EXTREMITIES: trace LE edema  NEUROLOGIC: non focal   Data Reviewed: Basic Metabolic Panel:  Recent Labs Lab 04/17/15 1557 04/18/15 0530 04/18/15 1330 04/19/15 0320 04/20/15 0620  NA 141 139 138 136 139  K 3.3* 3.7 3.6 3.7 3.2*  CL 101 101 102 100* 96*  CO2 32 _0 32  GLUCOSE 139* 199* 142* 201* 132*  BUN 25* 22* 21* 23* 18  CREATININE 2.84* 2.01* 1.69* 1.35* 1.23*  CALCIUM 8.8* 8.2* 8.0* 8.2* 8.3*  MG  --   --   --   --  1.5*  PHOS  --   --   --   --  2.7   Liver Function  Tests:  Recent Labs Lab 04/17/15 1557  AST 17  ALT 28  ALKPHOS 51  BILITOT 0.8  PROT 6.0*  ALBUMIN 3.3*   No results for input(s): LIPASE, AMYLASE in the last 168 hours. No results for input(s): AMMONIA in the last 168 hours. CBC:  Recent Labs Lab 04/17/15 1557 04/18/15 0530 04/19/15 0320 04/20/15 0620  WBC 9.3 7.1 7.5 7.3  HGB 11.9* 10.7* 11.0* 10.6*  HCT 39.3 34.9* 34.6* 34.4*  MCV 97.8 96.4 95.1 95.3  PLT 154 147* 149* 159   Cardiac Enzymes: No results for input(s): CKTOTAL, CKMB, CKMBINDEX, TROPONINI in the last 168 hours. BNP (last 3 results)  Recent Labs  10/30/14 1725 01/27/15 1012 04/18/15 1330  BNP 4.6 6.1 9.9    ProBNP (last 3 results)  Recent Labs  06/06/14 1250  PROBNP 18.0    CBG:  Recent Labs Lab 04/19/15 0809 04/19/15 1237 04/19/15 1712 04/20/15 0803 04/20/15 1242  GLUCAP 170* 152* 198* 174* 201*    Recent Results (from the past 240 hour(s))  MRSA PCR Screening     Status: None   Collection Time: 04/17/15 11:35 PM  Result Value Ref Range Status   MRSA by PCR NEGATIVE NEGATIVE Final    Comment:        The GeneXpert MRSA Assay (FDA approved for NASAL specimens only), is one component of a comprehensive MRSA colonization surveillance program. It is not intended to diagnose MRSA infection nor to guide or monitor treatment for MRSA infections.      Scheduled Meds: . allopurinol  200 mg Oral Daily  . aspirin EC  81 mg Oral q morning - 10a  . atorvastatin  10 mg Oral QPM  . DULoxetine  60 mg Oral Daily  . famotidine  20 mg Oral QHS  . furosemide  40 mg Intravenous BID  . gabapentin  1,200 mg Oral BID  . gabapentin  1,800 mg Oral QHS  . guaiFENesin  1,200 mg Oral BID  . insulin aspart  0-15 Units Subcutaneous TID WC  . levofloxacin  750 mg Oral QPM  . levothyroxine  50 mcg Oral QAC breakfast  . morphine  30 mg Oral Q12H  . pantoprazole  40 mg Oral Daily  . predniSONE  40 mg Oral Q breakfast   Continuous Infusions:    Marzetta Board, MD [facility] Pager [phone]. If 7 PM - 7 AM, please contact night-coverage at [email or web address], password [facility] 04/20/2015, 1:36 PM  LOS: 3 days

## 2015-04-20 NOTE — Consult Note (Signed)
PULMONARY / CRITICAL CARE MEDICINE   Name: [name] MRN: [id number] DOB: [birth year]    ADMISSION DATE:  04/17/2015 CONSULTATION DATE:  11/30 REFERRING MD : [facility]  CHIEF COMPLAINT:  AMS  HISTORY OF PRESENT ILLNESS:   74 MO(291 lbs) AAF with known ILD/RA followed by Dr. Brantley Persons (last visit 04/16/15) who has a known meningoma (sterotatic gamma knife 2013) and followed at [facility] last seen 2015. She is on chronic steroids for RA /ILD and was noted to be somnolent and difficult to arouse. Transported to [facility] and treated for hypoxia and hypotension and suspected PE treated with empiric heparin. She is awake and alert, O2 sats 99% on 4 l [place]. She is in no real distress.  I suspect her MSIR may be contributing to her decreased LOC.  Doubt she has PE.  SUBJECTIVE: Feels better. Now O2 requirements down to her baseline of 2 L. -2.2 L yesterday  VITAL SIGNS: BP 133/78 mmHg  Pulse 108  Temp(Src) 98.3 F (36.8 C) (Oral)  Resp 15  Ht _0  (1.626 m)  Wt 302 lb 4.8 oz (137.122 kg)  BMI 51.86 kg/m2  SpO2 99%  HEMODYNAMICS:    VENTILATOR SETTINGS:    INTAKE / OUTPUT: I/O last 3 completed shifts: In: 010 [P.O.:600; I.V.:225] Out: 3700 [Urine:3700]  PHYSICAL EXAMINATION: General:  Sitting in the chair, no distress Neuro: No gross neuro deficits HEENT: Moist mucous membranes no thyromegaly. Cardiovascular:  S1 and S2, regular rate and rhythm Lungs:  Clear, no wheezes, crackles Abdomen: Soft, non-tender nondistended, obese Musculoskeletal: intact Skin:  Warm and dry  LABS:  CBC  Recent Labs Lab 04/18/15 0530 04/19/15 0320 04/20/15 0620  WBC 7.1 7.5 7.3  HGB 10.7* 11.0* 10.6*  HCT 34.9* 34.6* 34.4*  PLT 147* 149* 159   Coag's No results for input(s): APTT, INR in the last 168 hours. BMET  Recent Labs Lab 04/18/15 1330 04/19/15 0320 04/20/15 0620  NA 138 136 139  K 3.6 3.7 3.2*  CL 102 100* 96*  CO2 28 28 32  BUN 21* 23* 18  CREATININE 1.69* 1.35* 1.23*   GLUCOSE 142* 201* 132*   Electrolytes  Recent Labs Lab 04/18/15 1330 04/19/15 0320 04/20/15 0620  CALCIUM 8.0* 8.2* 8.3*  MG  --   --  1.5*  PHOS  --   --  2.7   Sepsis Markers  Recent Labs Lab 04/17/15 1606  LATICACIDVEN 1.57   ABG  Recent Labs Lab 04/17/15 1819  PHART 7.405  PCO2ART 48.4*  PO2ART 79.0*   Liver Enzymes  Recent Labs Lab 04/17/15 1557  AST 17  ALT 28  ALKPHOS 51  BILITOT 0.8  ALBUMIN 3.3*   Cardiac Enzymes No results for input(s): TROPONINI, PROBNP in the last 168 hours. Glucose  Recent Labs Lab 04/18/15 1700 04/18/15 2233 04/19/15 0809 04/19/15 1237 04/19/15 1712 04/20/15 0803  GLUCAP 176* 148* 170* 152* 198* 174*    Imaging No results found.   STUDIES: \ Doppler neg pre lim Echo 12/1- normal LV function, diastolic dysfunction, dilated IVC suggestive of volume overload   CULTURES:  ANTIBIOTICS: 11/29 levaquin>>  SIGNIFICANT EVENTS:   LINES/TUBES:A/P  74 year old female with PMH of ILD chronically on steroids for RA who presents to the hospital with fatigue and SOB.  She is usually on 2L [place] at home and now is requiring 4L [place] for sat of 99%.  PCCM was called for hypoxemia and concern for a PE for patient is on heparin.  He  patient's hypoxemia is less than impressive and can easily be explained by her weight and likely heart failure.    Presentation likely due to edema. She is responding well to Lasix. She is now back at her baseline O2 requirements. Doubt this is an ILD flare. Sedimentation rate mildly elevated, rheumatoid factor elevated consistent with RA. Echo still shows dilated IVC indicating that she requires further diuresis. AKI is improving  Recommendations: - Continue diureses with Lasix. - Continue Levaquin for empiric treatment of pneumonia - Supplemental oxygen at 2 L - Physical therapy and ambulation - Transition steroids to prednisone today.  Marshell Garfinkel MD [facility] Pager [phone] If no answer or after 3pm call: [phone] 04/20/2015, 9:05 AM

## 2015-04-20 NOTE — Care Management Note (Addendum)
Case Management Note  Patient Details  Name: [name] MRN: [id number] Date of Birth: [birth year]  Subjective/Objective:                 Spoke with patient and husband and son at bedside. Patient states that she receives home oxygen through [facility]. Patient states that her husband drives her to MD appointments and she denies needing any assistance with her medications. She states that she has a wheelchair, a walker, bedside commode, and a cane at home and denies any other needs for DME.   Action/Plan:  PT recommends HH PT  But also stated that patient would benefit from SNF,  referral placed to [facility] in event of home discharge. CSW notified. Awaiting MD order.  Expected Discharge Date:                  Expected Discharge Plan:  [place]  In-House Referral:  Clinical Social Work  Discharge planning Services  CM Consult  Post Acute Care Choice:    Choice offered to:     DME Arranged:    DME Agency:     HH Arranged:  PT [place]:  [place]  Status of Service:  Completed, signed off  Medicare Important Message Given:  Yes Date Medicare IM Given:    Medicare IM give by:    Date Additional Medicare IM Given:    Additional Medicare Important Message give by:     If discussed at [place] of Stay Meetings, dates discussed:    Additional Comments:  Carles Collet, RN 04/20/2015, 2:56 PM

## 2015-04-20 NOTE — Evaluation (Signed)
Physical Therapy Evaluation Patient Details Name: [name] MRN: [id number] DOB: 1941-01-29 Today's Date: 04/20/2015   History of Present Illness  Patient is a 74 y/o female with PMH of ILD chronically on steroids for RA who presents to the hospital with fatigue and SOB. In ED, noted to initially be hypotensive with BPs as low as the 91Y systolic with ARF.  Clinical Impression  Patient presents with impaired endurance, weakness, deconditioning and DOE impacting safe mobility. Tolerated ambulation with Min guard assist for safety due to instability in bil knees and abnormal elevation in HR. Pt lives with spouse but will likely need to help at home due to deconditioned state. Highly motivated to return to independence. Would benefit from [facility] SNF to maximize independence and mobility prior to return home.    Follow Up Recommendations Home health PT;Supervision - Intermittent    Equipment Recommendations  None recommended by PT    Recommendations for Other Services OT consult     Precautions / Restrictions Precautions Precautions: Fall Precaution Comments: Monitor HR Restrictions Weight Bearing Restrictions: No      Mobility  Bed Mobility               General bed mobility comments: Sitting in chair upon PT arrival.   Transfers Overall transfer level: Needs assistance Equipment used: Rolling walker (2 wheeled) Transfers: Sit to/from Stand Sit to Stand: Min guard         General transfer comment: Min guard for safety. Cues for hand placement/technique.  Ambulation/Gait Ambulation/Gait assistance: Min guard Ambulation Distance (Feet): 75 Feet Assistive device: Rolling walker (2 wheeled) Gait Pattern/deviations: Step-through pattern;Decreased stride length;Wide base of support   Gait velocity interpretation: <1.8 ft/sec, indicative of risk for recurrent falls General Gait Details: SLow, mildly unsteady gait with close Min guard due to bil knee instability. HR  increased to 146 bpm. Sp02 >93% on 2L/min 02. DOE 3/4.  Stairs            Wheelchair Mobility    Modified Rankin (Stroke Patients Only)       Balance Overall balance assessment: Needs assistance Sitting-balance support: Feet supported;No upper extremity supported Sitting balance-Leahy Scale: Good     Standing balance support: During functional activity Standing balance-Leahy Scale: Poor Standing balance comment: Relient on RW for support.                             Pertinent Vitals/Pain Pain Assessment: Faces Faces Pain Scale: Hurts little more Pain Location: BLEs andback Pain Descriptors / Indicators: Sore;Aching Pain Intervention(s): Monitored during session;Repositioned    Home Living Family/patient expects to be discharged to:: Private residence Living Arrangements: Spouse/significant other Available Help at Discharge: Family;Available PRN/intermittently Type of Home: House Home Access: Stairs to enter Entrance Stairs-Rails: None Entrance Stairs-Number of Steps: small threshold step Home Layout: Two level;Able to live on main level with bedroom/bathroom Home Equipment: Gilford Rile - 2 wheels;Walker - 4 wheels;Cane - single point;Wheelchair - [employer];Shower seat Additional Comments: wears 02 at home 2L/min 02    Prior Function Level of Independence: Independent with assistive device(s);Needs assistance      ADL's / Homemaking Assistance Needed: Daughter assists with IADLs - laundry.  Comments: Uses SPC vs RW for ambulation.     Hand Dominance   Dominant Hand: Right    Extremity/Trunk Assessment   Upper Extremity Assessment: Defer to OT evaluation           Lower Extremity Assessment:  Generalized weakness         Communication   Communication: No difficulties  Cognition Arousal/Alertness: Awake/alert Behavior During Therapy: WFL for tasks assessed/performed Overall Cognitive Status: Within Functional Limits for  tasks assessed                      General Comments General comments (skin integrity, edema, etc.): Lengthy discussion re:' energy conservation techniques, activity levels, breathing techniques, positioning etc.    Exercises        Assessment/Plan    PT Assessment Patient needs continued PT services  PT Diagnosis Difficulty walking;Generalized weakness   PT Problem List Decreased strength;Cardiopulmonary status limiting activity;Pain;Decreased activity tolerance;Decreased balance;Decreased mobility  PT Treatment Interventions Balance training;Gait training;Functional mobility training;Therapeutic activities;Therapeutic exercise;Patient/family education   PT Goals (Current goals can be found in the Care Plan section) Acute Rehab PT Goals Patient Stated Goal: to be independent PT Goal Formulation: With patient Time For Goal Achievement: 05/04/15 Potential to Achieve Goals: Good    Frequency Min 3X/week   Barriers to discharge Decreased caregiver support Lack of support at home    Co-evaluation               End of Session Equipment Utilized During Treatment: Gait belt;Oxygen Activity Tolerance: Patient tolerated treatment well;Treatment limited secondary to medical complications (Comment) (elevated HR) Patient left: in chair;with call bell/phone within reach;with chair alarm set Nurse Communication: Mobility status         Time: 5631-4970 PT Time Calculation (min) (ACUTE ONLY): 33 min   Charges:   PT Evaluation $Initial PT Evaluation Tier I: 1 Procedure PT Treatments $Gait Training: 8-22 mins   PT G Codes:        [name] 04/20/2015, 10:36 AM  Wray Kearns, PT, DPT [phone]

## 2015-04-21 DIAGNOSIS — I5032 Chronic diastolic (congestive) heart failure: Secondary | ICD-10-CM | POA: Diagnosis present

## 2015-04-21 DIAGNOSIS — I5033 Acute on chronic diastolic (congestive) heart failure: Secondary | ICD-10-CM | POA: Diagnosis present

## 2015-04-21 LAB — BASIC METABOLIC PANEL
Anion gap: 7 (ref 5–15)
BUN: 21 mg/dL — ABNORMAL HIGH (ref 6–20)
CO2: 34 mmol/L — ABNORMAL HIGH (ref 22–32)
Calcium: 8.4 mg/dL — ABNORMAL LOW (ref 8.9–10.3)
Chloride: 97 mmol/L — ABNORMAL LOW (ref 101–111)
Creatinine, Ser: 1.17 mg/dL — ABNORMAL HIGH (ref 0.44–1.00)
GFR calc Af Amer: 52 mL/min — ABNORMAL LOW (ref >60)
GFR calc non Af Amer: 45 mL/min — ABNORMAL LOW (ref >60)
Glucose, Bld: 257 mg/dL — ABNORMAL HIGH (ref 65–99)
Potassium: 3.6 mmol/L (ref 3.5–5.1)
Sodium: 138 mmol/L (ref 135–145)

## 2015-04-21 LAB — GLUCOSE, CAPILLARY
GLUCOSE-CAPILLARY: 148 mg/dL — AB (ref 65–99)
GLUCOSE-CAPILLARY: 208 mg/dL — AB (ref 65–99)
Glucose-Capillary: 204 mg/dL — ABNORMAL HIGH (ref 65–99)
Glucose-Capillary: 156 mg/dL — ABNORMAL HIGH (ref 65–99)

## 2015-04-21 MED ORDER — SALINE SPRAY 0.65 % NA SOLN
1.0000 | NASAL | Status: DC | PRN
  Administered 2015-04-21: 1 via NASAL
  Filled 2015-04-21: qty 44

## 2015-04-21 MED ORDER — FUROSEMIDE 40 MG PO TABS
40.0000 mg | ORAL_TABLET | Freq: Two times a day (BID) | ORAL | Status: DC
  Administered 2015-04-21 – 2015-04-22 (×2): 40 mg via ORAL
  Filled 2015-04-21 (×2): qty 1

## 2015-04-21 NOTE — Progress Notes (Signed)
Pt was tachycardic in the 140s after episode of Vtach. MD paged. EKG obtained. Pt has no complaints. VSS. Will continue to monitor.

## 2015-04-21 NOTE — Progress Notes (Signed)
Met with pt and her two daughters to discuss SNF placement and give bed offer.  At this time, pt has only one bed offer (pt faxed out later in the day 12/2) and family has adamantly refused that facility.  Daughters are making arrangements to visit several facilities this am and CSW hopeful that pt can d/c to one of her preferred facilities.  Daughters to f/u with CSW this afternoon.  CSW will continue to follow.  Creta Levin LCSW (Saturday Coverage) [phone]

## 2015-04-21 NOTE — Progress Notes (Signed)
PROGRESS NOTE  [name] GBE:[id number] DOB: Apr 12, 1941 DOA: 04/17/2015 PCP: Maximino Greenland, MD   HPI: 74 y.o. female with PMH including RA with ILD, obesity with BMI of 50, deconditioning. Patient presents to the ED with fatigue, SOB that suddenly got worse.  Subjective / 24 H Interval events - feels better, able to ambulate with improved dyspnea. 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 Pulmpnology was consulted and have followed patient while hospitalized. She underwent a 2D echo which showed normal EF and grade I diastolic dysfunction.  Acute on chronic diastolic CHF - patient has been having difficulties at home with fluid overload, on 40 mg BID Lasix she became dehydrated and on 40 mg daily she is prone to fluid overload. Change regimen to 80 mg daily alternating with 40 mg daily, monitor daily weights and further adjust if needed. Repeat BMP in 3-4 days after discharge. Hypotension - on admission, concern for adrenal insufficiency given missed steroids dose and chronic use, she was initially on IV steroids which were transitioned to Prednisone, and will need a gradual taper over 3 weeks Sepsis - although her symptoms of congestion, cough, have been improving on the levaquin  and hasnt worsened at all. She was continued on Levaquin while hospitalized.  AKI on CKD III - perhaps related to hypotension, improved with gentle hydration on admission, she was started on lasix with subsequent improvement in her renal function now better than baseline. DM2 - SSI AC/HS while on steroids HTN - holding BP meds due to hypotension, at home she was on Benicar HCT 40-25 which was held. Continue to hold this medication for now. CAP - continue Levaquin as IV for now as this appears to have been responding to treatment from HPI. Hypothyroidism - resume synthroid Morbid obesity - encouraged weight loss   Diet: Diet Carb Modified Fluid consistency:: Thin; Room service appropriate?: Yes Fluids: none  DVT Prophylaxis: heparin  Code Status: Full Code Family Communication: d/w husband bedside Disposition Plan: remain inpatient  Barriers to discharge: SNF placement  Consultants:  PCCM  Procedures:  None    Antibiotics Levaquin 11/29 >>   Studies  No results found.  Objective  Filed Vitals:   04/20/15 1917 04/20/15 2034 04/21/15 0610 04/21/15 1426  BP: 140/90 136/81 125/83 141/82  Pulse: 113 107 96 94  Temp: 98.8 F (37.1 C) 98.2 F (36.8 C) 98.4 F (36.9 C) 98.3 F (36.8 C)  TempSrc: Oral Oral Oral Oral  Resp:  22 20   Height:      Weight:      SpO2: 99% 98% 99% 96%    Intake/Output Summary (Last 24 hours) at 04/21/15 1454 Last data filed at 04/21/15 1428  Gross per 24 hour  Intake    456 ml  Output   1550 ml  Net  -1094  ml   Filed Weights   04/17/15 1541 04/17/15 2231 04/19/15 1649  Weight: 132.45 kg (292 lb) 132.4 kg (291 lb 14.2 oz) 137.122 kg (302 lb 4.8 oz)    Exam:  GENERAL: NAD, morbidly obese  HEENT: PERRL, no scleral icterus  NECK: obese  LUNGS: no wheezing, poor exam due to body habitus  HEART: RRR, no murmurs  ABDOMEN: obese, non tender  EXTREMITIES: trace LE edema  NEUROLOGIC: non focal   Data Reviewed: Basic Metabolic  Panel:  Recent Labs Lab 04/18/15 0530 04/18/15 1330 04/19/15 0320 04/20/15 0620 04/21/15 0955  NA 139 138 136 139 138  K 3.7 3.6 3.7 3.2* 3.6  CL 101 102 100* 96* 97*  CO2 _0 32 34*  GLUCOSE 199* 142* 201* 132* 257*  BUN 22* 21* 23* 18 21*  CREATININE 2.01* 1.69* 1.35* 1.23* 1.17*  CALCIUM 8.2* 8.0* 8.2* 8.3* 8.4*  MG  --   --   --  1.5*  --   PHOS  --   --   --  2.7  --    Liver Function Tests:  Recent Labs Lab 04/17/15 1557  AST 17  ALT 28  ALKPHOS 51  BILITOT 0.8  PROT 6.0*  ALBUMIN 3.3*   No results for input(s): LIPASE, AMYLASE in the last 168 hours. No results for input(s): AMMONIA in the last 168 hours. CBC:  Recent Labs Lab 04/17/15 1557 04/18/15 0530 04/19/15 0320 04/20/15 0620  WBC 9.3 7.1 7.5 7.3  HGB 11.9* 10.7* 11.0* 10.6*  HCT 39.3 34.9* 34.6* 34.4*  MCV 97.8 96.4 95.1 95.3  PLT 154 147* 149* 159   Cardiac Enzymes: No results for input(s): CKTOTAL, CKMB, CKMBINDEX, TROPONINI in the last 168 hours. BNP (last 3 results)  Recent Labs  10/30/14 1725 01/27/15 1012 04/18/15 1330  BNP 4.6 6.1 9.9    ProBNP (last 3 results)  Recent Labs  06/06/14 1250  PROBNP 18.0    CBG:  Recent Labs Lab 04/20/15 1242 04/20/15 1726 04/20/15 2042 04/21/15 0741 04/21/15 1141  GLUCAP 201* 170* 193* 148* 208*    Recent Results (from the past 240 hour(s))  MRSA PCR Screening     Status: None   Collection Time: 04/17/15 11:35 PM  Result Value Ref Range Status   MRSA by PCR NEGATIVE NEGATIVE Final    Comment:        The GeneXpert MRSA Assay (FDA approved for NASAL specimens only), is one component of a comprehensive MRSA colonization surveillance program. It is not intended to diagnose MRSA infection nor to guide or monitor treatment for MRSA infections.      Scheduled Meds: . allopurinol  200 mg Oral Daily  . aspirin EC  81 mg Oral q morning - 10a  . atorvastatin  10 mg Oral QPM  . DULoxetine  60 mg Oral Daily  . famotidine   20 mg Oral QHS  . gabapentin  1,200 mg Oral BID  . gabapentin  1,800 mg Oral QHS  . guaiFENesin  1,200 mg Oral BID  . insulin aspart  0-15 Units Subcutaneous TID WC  . levofloxacin  750 mg Oral QPM  . levothyroxine  50 mcg Oral QAC breakfast  . morphine  30 mg Oral Q12H  . pantoprazole  40 mg Oral Daily  . predniSONE  40 mg Oral Q breakfast   Continuous Infusions:   Marzetta Board, MD [facility] Pager [phone]. If 7 PM - 7 AM, please contact night-coverage at [email or web address], password [facility]  04/21/2015, 2:54 PM  LOS: 4 days

## 2015-04-21 NOTE — Progress Notes (Signed)
Spoke with pt's daughter's who met with Admissions Coordinator at [facility] today and want pt to go there at d/c.  CSW has attempted to reach out to [facility], but has not received a return call as of yet.  CSW will continue to follow for disposition and will discuss pt's pending d/c with Sunday coverage.  Creta Levin, LCSW Saturday Coverage [phone]

## 2015-04-22 DIAGNOSIS — M1A9XX0 Chronic gout, unspecified, without tophus (tophi): Secondary | ICD-10-CM | POA: Diagnosis not present

## 2015-04-22 DIAGNOSIS — E079 Disorder of thyroid, unspecified: Secondary | ICD-10-CM | POA: Diagnosis not present

## 2015-04-22 DIAGNOSIS — E785 Hyperlipidemia, unspecified: Secondary | ICD-10-CM | POA: Diagnosis not present

## 2015-04-22 DIAGNOSIS — J9621 Acute and chronic respiratory failure with hypoxia: Secondary | ICD-10-CM | POA: Diagnosis not present

## 2015-04-22 DIAGNOSIS — R531 Weakness: Secondary | ICD-10-CM | POA: Diagnosis not present

## 2015-04-22 DIAGNOSIS — F329 Major depressive disorder, single episode, unspecified: Secondary | ICD-10-CM | POA: Diagnosis not present

## 2015-04-22 DIAGNOSIS — M4806 Spinal stenosis, lumbar region: Secondary | ICD-10-CM | POA: Diagnosis not present

## 2015-04-22 DIAGNOSIS — D638 Anemia in other chronic diseases classified elsewhere: Secondary | ICD-10-CM | POA: Diagnosis not present

## 2015-04-22 DIAGNOSIS — J9611 Chronic respiratory failure with hypoxia: Secondary | ICD-10-CM | POA: Diagnosis not present

## 2015-04-22 DIAGNOSIS — M069 Rheumatoid arthritis, unspecified: Secondary | ICD-10-CM | POA: Diagnosis not present

## 2015-04-22 DIAGNOSIS — J189 Pneumonia, unspecified organism: Secondary | ICD-10-CM | POA: Diagnosis not present

## 2015-04-22 DIAGNOSIS — M5416 Radiculopathy, lumbar region: Secondary | ICD-10-CM | POA: Diagnosis not present

## 2015-04-22 DIAGNOSIS — M6281 Muscle weakness (generalized): Secondary | ICD-10-CM | POA: Diagnosis not present

## 2015-04-22 DIAGNOSIS — J849 Interstitial pulmonary disease, unspecified: Secondary | ICD-10-CM | POA: Diagnosis not present

## 2015-04-22 DIAGNOSIS — R1312 Dysphagia, oropharyngeal phase: Secondary | ICD-10-CM | POA: Diagnosis not present

## 2015-04-22 DIAGNOSIS — R2681 Unsteadiness on feet: Secondary | ICD-10-CM | POA: Diagnosis not present

## 2015-04-22 DIAGNOSIS — N179 Acute kidney failure, unspecified: Secondary | ICD-10-CM | POA: Diagnosis not present

## 2015-04-22 DIAGNOSIS — I5033 Acute on chronic diastolic (congestive) heart failure: Secondary | ICD-10-CM | POA: Diagnosis not present

## 2015-04-22 DIAGNOSIS — R278 Other lack of coordination: Secondary | ICD-10-CM | POA: Diagnosis not present

## 2015-04-22 DIAGNOSIS — K5900 Constipation, unspecified: Secondary | ICD-10-CM | POA: Diagnosis not present

## 2015-04-22 DIAGNOSIS — J188 Other pneumonia, unspecified organism: Secondary | ICD-10-CM | POA: Diagnosis not present

## 2015-04-22 DIAGNOSIS — Z9181 History of falling: Secondary | ICD-10-CM | POA: Diagnosis not present

## 2015-04-22 DIAGNOSIS — E1140 Type 2 diabetes mellitus with diabetic neuropathy, unspecified: Secondary | ICD-10-CM | POA: Diagnosis not present

## 2015-04-22 LAB — GLUCOSE, CAPILLARY
GLUCOSE-CAPILLARY: 189 mg/dL — AB (ref 65–99)
Glucose-Capillary: 143 mg/dL — ABNORMAL HIGH (ref 65–99)

## 2015-04-22 MED ORDER — MORPHINE SULFATE ER 30 MG PO TBCR: 30.0000 mg | EXTENDED_RELEASE_TABLET | Freq: Two times a day (BID) | ORAL | Status: DC

## 2015-04-22 MED ORDER — FUROSEMIDE 40 MG PO TABS: 40.0000 mg | ORAL_TABLET | Freq: Every day | ORAL | Status: DC

## 2015-04-22 MED ORDER — MORPHINE SULFATE 15 MG PO TABS: ORAL_TABLET | ORAL | Status: DC

## 2015-04-22 MED ORDER — PREDNISONE 20 MG PO TABS: 40.0000 mg | ORAL_TABLET | Freq: Every day | ORAL | Status: DC

## 2015-04-22 NOTE — Discharge Summary (Signed)
Physician Discharge Summary  GRACE VALLEY VHQ:[id number] DOB: 06/12/1940 DOA: 04/17/2015  PCP: Maximino Greenland, MD  Admit date: 04/17/2015 Discharge date: 04/22/2015  Time spent: > 30 minutes  Recommendations for Outpatient Follow-up:  1. Follow up with Dr. [name] in 3-4 weeks 2. Follow up with Dr. Baird Cancer in 1-2 weeks 3. Lasix dose changed to 80 mg and 40 mg in alternating days 4. Regular weights 5. Please repeat BMP in 3-4 days 6. 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)   Acute on chronic respiratory failure with hypoxia (HCC)   CAP (community acquired pneumonia)   Acute kidney injury superimposed on chronic kidney disease ([place])   Acute pulmonary edema (HCC)   AKI (acute kidney injury) ([place])   Peripheral edema   Acute on chronic diastolic heart failure ([place])  Discharge Condition: stable  Diet recommendation: heart healthy  Filed Weights   04/17/15 1541 04/17/15 2231 04/19/15 1649  Weight: 132.45 kg (292 lb) 132.4 kg (291 lb 14.2 oz) 137.122 kg (302 lb 4.8 oz)    History of present illness:  See H&P, Labs, Consult and Test reports for all details in brief, patient is a 74 y.o. female with PMH including RA with ILD, obesity with BMI of 50, deconditioning. Patient presents to the ED with fatigue, SOB that suddenly got worse.  Hospital Course:  Acute on chronic resp failure with hypoxia - patient admitted with increased O2 requirement, likely multifactorial due to diastolic dysfunction, known ILD, obesity hypoventilation. Pulmpnology was consulted and have followed patient while hospitalized. She underwent a 2D echo which showed normal EF and grade I diastolic dysfunction. She is to further follow up  with [name] as an outpatient.  Acute on chronic diastolic CHF - patient has been having difficulties at home with fluid overload, on 40 mg BID Lasix she became dehydrated and on 40 mg daily she is prone to fluid overload. Changed regimen to 80 mg daily alternating with 40 mg daily, monitor daily weights and further adjust if needed. Repeat BMP in 3-4 days after discharge. Hypotension - on admission, concern for adrenal insufficiency given missed steroids dose and chronic use, she was initially on IV steroids which were transitioned to Prednisone, and will need a gradual taper over 3 weeks, to remain on prednisone until seen by pulmonology  Sepsis - although her symptoms of congestion, cough, have been improving on the levaquin and hasnt worsened at all. She was continued on Levaquin while hospitalized and completed course. AKI on CKD III - perhaps related to hypotension, improved with gentle hydration on admission, she was started on lasix with subsequent improvement in her renal function now better than baseline. DM2 - SSI AC/HS while on steroids HTN - holding BP meds due to hypotension, at home she was on Benicar HCT 40-25 which was held. Continue to hold this medication for now. CAP - s/p course with levaquin Hypothyroidism - resume synthroid Morbid obesity - encouraged weight loss   Procedures:  None    Consultations:  PCCM  Discharge Exam: Filed Vitals:   04/21/15 0610 04/21/15 1426 04/21/15 2230 04/22/15 0611  BP: 125/83 141/82 136/78 144/98  Pulse: 96 94 94 98  Temp: 98.4 F (36.9 C) 98.3 F (36.8 C) 98.5  F (36.9 C) 97.7 F (36.5 C)  TempSrc: Oral Oral Oral Oral  Resp: 20   18  Height:      Weight:      SpO2: 99% 96% 98% 98%    General: NAD Cardiovascular: RRR Respiratory: CTA biL  Discharge Instructions Activity:  As tolerated   Get Medicines reviewed and adjusted: Please take all your medications with you for your next visit with your Primary  MD  Please request your Primary MD to go over all hospital tests and procedure/radiological results at the follow up, please ask your Primary MD to get all Hospital records sent to his/her office.  If you experience worsening of your admission symptoms, develop shortness of breath, life threatening emergency, suicidal or homicidal thoughts you must seek medical attention immediately by calling 911 or calling your MD immediately if symptoms less severe.  You must read complete instructions/literature along with all the possible adverse reactions/side effects for all the Medicines you take and that have been prescribed to you. Take any new Medicines after you have completely understood and accpet all the possible adverse reactions/side effects.   Do not drive when taking Pain medications.   Do not take more than prescribed Pain, Sleep and Anxiety Medications  Special Instructions: If you have smoked or chewed Tobacco in the last 2 yrs please stop smoking, stop any regular Alcohol and or any Recreational drug use.  Wear Seat belts while driving.  Please note  You were cared for by a hospitalist during your hospital stay. Once you are discharged, your primary care physician will handle any further medical issues. Please note that NO REFILLS for any discharge medications will be authorized once you are discharged, as it is imperative that you return to your primary care physician (or establish a relationship with a primary care physician if you do not have one) for your aftercare needs so that they can reassess your need for medications and monitor your lab values.    Medication List    STOP taking these medications        BENICAR HCT 40-25 MG tablet  Generic drug:  olmesartan-hydrochlorothiazide      TAKE these medications        ACCU-CHEK SMARTVIEW test strip  Generic drug:  glucose blood  1 each by Other route every morning.     acetaminophen 500 MG tablet  Commonly known as:   TYLENOL  Take 500-1,000 mg by mouth 2 (two) times daily as needed for pain. Takes with tramadol     AEROCHAMBER MV inhaler  Use as instructed     albuterol 108 (90 BASE) MCG/ACT inhaler  Commonly known as:  PROVENTIL HFA;VENTOLIN HFA  Inhale 2 puffs into the lungs every 6 (six) hours as needed for wheezing or shortness of breath.     allopurinol 100 MG tablet  Commonly known as:  ZYLOPRIM  Take 2 tablets (200 mg total) by mouth daily.     aspirin EC 81 MG tablet  Take 81 mg by mouth every morning.     atorvastatin 20 MG tablet  Commonly known as:  LIPITOR  Take 10 mg by mouth every evening.     CALCIUM + D3 PO  Take 2 tablets by mouth daily with lunch.     cholecalciferol 1000 UNITS tablet  Commonly known as:  VITAMIN D  Take 2,000 Units by mouth daily with lunch.     colchicine 0.6 MG tablet  Take 0.6 mg by mouth daily as  needed (GOUT).     diclofenac sodium 1 % Gel  Commonly known as:  VOLTAREN  Apply 1 application topically 3 (three) times daily.     DULoxetine 60 MG capsule  Commonly known as:  CYMBALTA  Take 1 capsule (60 mg total) by mouth daily.     famotidine 20 MG tablet  Commonly known as:  PEPCID  Take 1 tablet (20 mg total) by mouth at bedtime.     fluticasone 50 MCG/ACT nasal spray  Commonly known as:  FLONASE  Place 2 sprays into both nostrils daily.     furosemide 40 MG tablet  Commonly known as:  LASIX  Take 1-2 tablets (40-80 mg total) by mouth daily. Alternate doses, take 80 mg one day then 40 mg the next.     gabapentin 600 MG tablet  Commonly known as:  NEURONTIN  Take 1 tablet (600 mg total) by mouth as directed. Take 2 tablet in the morning, 2 tablet in the middle of the day, and 3 tablets at night     guaiFENesin 600 MG 12 hr tablet  Commonly known as:  MUCINEX  Take 1,200 mg by mouth 2 (two) times daily.     levothyroxine 50 MCG tablet  Commonly known as:  SYNTHROID, LEVOTHROID  Take 1 tablet (50 mcg total) by mouth daily.      metoprolol succinate 100 MG 24 hr tablet  Commonly known as:  TOPROL-XL  Take 100 mg by mouth every morning. Take with or immediately following a meal.     morphine 30 MG 12 hr tablet  Commonly known as:  MS CONTIN  Take 1 tablet (30 mg total) by mouth every 12 (twelve) hours.     morphine 15 MG tablet  Commonly known as:  MSIR  TAKE 1/2 TO 1 TABLET BY MOUTH EVERY 6 HOURS AS NEEDED FOR MODERATE PAIN     multivitamin with minerals Tabs tablet  Take 1 tablet by mouth daily with lunch.     OXYGEN  Inhale 2 L into the lungs at bedtime. And on exertion     pantoprazole 40 MG tablet  Commonly known as:  PROTONIX  TAKE 1 TABLET BY MOUTH DAILY, MINUTES BEFORE IST MEAL  OF THE DAY     predniSONE 20 MG tablet  Commonly known as:  DELTASONE  Take 2 tablets (40 mg total) by mouth daily with breakfast. Take 40 mg daily for 1 week then take 20 mg daily for 1 week then take 10 mg daily until seen by Dr. [name]     sodium chloride 0.65 % Soln nasal spray  Commonly known as:  OCEAN  Place 1 spray into both nostrils as needed for congestion.     VICTOZA 18 MG/3ML Sopn  Generic drug:  Liraglutide  Inject 1.8 mg as directed daily.         The results of significant diagnostics from this hospitalization (including imaging, microbiology, ancillary and laboratory) are listed below for reference.    Significant Diagnostic Studies: Dg Chest 2 View  04/17/2015  CLINICAL DATA:  Chest pain shortness of breath weakness 1 day, cough EXAM: CHEST  2 VIEW COMPARISON:  01/27/2015 FINDINGS: Study limited by body habitus. Cardiac silhouette exaggerated by lordotic positioning likely within normal limits. Mild vascular congestion. Very limited inspiratory effect. Increased opacity at both lung bases. No effusions. IMPRESSION: Bibasilar opacities with very limited lung volumes and limited overall by body habitus. Possibilities include lower lobe atelectasis or pneumonia. Electronically Signed  By: Skipper Cliche M.D.   On: 04/17/2015 16:32    Microbiology: Recent Results (from the past 240 hour(s))  MRSA PCR Screening     Status: None   Collection Time: 04/17/15 11:35 PM  Result Value Ref Range Status   MRSA by PCR NEGATIVE NEGATIVE Final    Comment:        The GeneXpert MRSA Assay (FDA approved for NASAL specimens only), is one component of a comprehensive MRSA colonization surveillance program. It is not intended to diagnose MRSA infection nor to guide or monitor treatment for MRSA infections.      Labs: Basic Metabolic Panel:  Recent Labs Lab 04/17/15 1557 04/18/15 0530 04/18/15 1330 04/19/15 0320 04/20/15 0620 04/21/15 0955  NA 141 139 138 136 139 138  K 3.3* 3.7 3.6 3.7 3.2* 3.6  CL 101 101 102 100* 96* 97*  CO2 32 _0 32 34*  GLUCOSE 139* 199* 142* 201* 132* 257*  BUN 25* 22* 21* 23* 18 21*  CREATININE 2.84* 2.01* 1.69* 1.35* 1.23* 1.17*  CALCIUM 8.8* 8.2* 8.0* 8.2* 8.3* 8.4*  MG  --   --   --   --  1.5*  --   PHOS  --   --   --   --  2.7  --    Liver Function Tests:  Recent Labs Lab 04/17/15 1557  AST 17  ALT 28  ALKPHOS 51  BILITOT 0.8  PROT 6.0*  ALBUMIN 3.3*   CBC:  Recent Labs Lab 04/17/15 1557 04/18/15 0530 04/19/15 0320 04/20/15 0620  WBC 9.3 7.1 7.5 7.3  HGB 11.9* 10.7* 11.0* 10.6*  HCT 39.3 34.9* 34.6* 34.4*  MCV 97.8 96.4 95.1 95.3  PLT 154 147* 149* 159   BNP: BNP (last 3 results)  Recent Labs  10/30/14 1725 01/27/15 1012 04/18/15 1330  BNP 4.6 6.1 9.9    ProBNP (last 3 results)  Recent Labs  06/06/14 1250  PROBNP 18.0    CBG:  Recent Labs Lab 04/21/15 0741 04/21/15 1141 04/21/15 1633 04/21/15 2228 04/22/15 0803  GLUCAP 148* 208* 204* 156* 143*    Signed:  Lasheka Kempner  [facility] 04/22/2015, 10:53 AM

## 2015-04-22 NOTE — Progress Notes (Addendum)
Shift progressed uneventfully. Patient slept well overnight and denied any pain. Prophylactic analgesic therapy with scheduled dosing of Extended-Release Morphine provided stable pain relief for chronic back and leg pain, obviating need for breakthrough pain management. Patient made occasional ambulation attempts to the bathroom, reliant on a walker for support. No evidence of gait or balance impairment noted. Vital signs remained stable overnight.

## 2015-04-24 ENCOUNTER — Encounter: Payer: Self-pay | Admitting: Internal Medicine

## 2015-04-24 ENCOUNTER — Other Ambulatory Visit: Payer: Self-pay | Admitting: *Deleted

## 2015-04-24 ENCOUNTER — Encounter (HOSPITAL_COMMUNITY): Payer: Medicare Other

## 2015-04-24 ENCOUNTER — Non-Acute Institutional Stay (SKILLED_NURSING_FACILITY): Payer: Medicare Other | Admitting: Internal Medicine

## 2015-04-24 DIAGNOSIS — M1711 Unilateral primary osteoarthritis, right knee: Secondary | ICD-10-CM

## 2015-04-24 DIAGNOSIS — J189 Pneumonia, unspecified organism: Secondary | ICD-10-CM

## 2015-04-24 DIAGNOSIS — F329 Major depressive disorder, single episode, unspecified: Secondary | ICD-10-CM | POA: Diagnosis not present

## 2015-04-24 DIAGNOSIS — J9611 Chronic respiratory failure with hypoxia: Secondary | ICD-10-CM | POA: Diagnosis not present

## 2015-04-24 DIAGNOSIS — R5382 Chronic fatigue, unspecified: Secondary | ICD-10-CM

## 2015-04-24 DIAGNOSIS — E079 Disorder of thyroid, unspecified: Secondary | ICD-10-CM | POA: Diagnosis not present

## 2015-04-24 DIAGNOSIS — M5416 Radiculopathy, lumbar region: Secondary | ICD-10-CM

## 2015-04-24 DIAGNOSIS — E785 Hyperlipidemia, unspecified: Secondary | ICD-10-CM

## 2015-04-24 DIAGNOSIS — M4806 Spinal stenosis, lumbar region: Secondary | ICD-10-CM

## 2015-04-24 DIAGNOSIS — M47816 Spondylosis without myelopathy or radiculopathy, lumbar region: Secondary | ICD-10-CM

## 2015-04-24 DIAGNOSIS — M7062 Trochanteric bursitis, left hip: Secondary | ICD-10-CM

## 2015-04-24 DIAGNOSIS — J849 Interstitial pulmonary disease, unspecified: Secondary | ICD-10-CM | POA: Diagnosis not present

## 2015-04-24 DIAGNOSIS — K219 Gastro-esophageal reflux disease without esophagitis: Secondary | ICD-10-CM

## 2015-04-24 DIAGNOSIS — D638 Anemia in other chronic diseases classified elsewhere: Secondary | ICD-10-CM

## 2015-04-24 DIAGNOSIS — K5900 Constipation, unspecified: Secondary | ICD-10-CM | POA: Diagnosis not present

## 2015-04-24 DIAGNOSIS — M48061 Spinal stenosis, lumbar region without neurogenic claudication: Secondary | ICD-10-CM

## 2015-04-24 DIAGNOSIS — R531 Weakness: Secondary | ICD-10-CM | POA: Diagnosis not present

## 2015-04-24 DIAGNOSIS — I5033 Acute on chronic diastolic (congestive) heart failure: Secondary | ICD-10-CM | POA: Diagnosis not present

## 2015-04-24 DIAGNOSIS — M069 Rheumatoid arthritis, unspecified: Secondary | ICD-10-CM

## 2015-04-24 DIAGNOSIS — E1140 Type 2 diabetes mellitus with diabetic neuropathy, unspecified: Secondary | ICD-10-CM | POA: Diagnosis not present

## 2015-04-24 DIAGNOSIS — F32A Depression, unspecified: Secondary | ICD-10-CM

## 2015-04-24 DIAGNOSIS — M1A9XX0 Chronic gout, unspecified, without tophus (tophi): Secondary | ICD-10-CM

## 2015-04-24 MED ORDER — MORPHINE SULFATE ER 30 MG PO TBCR: 30.0000 mg | EXTENDED_RELEASE_TABLET | Freq: Two times a day (BID) | ORAL | Status: DC

## 2015-04-24 MED ORDER — MORPHINE SULFATE 15 MG PO TABS: ORAL_TABLET | ORAL | Status: DC

## 2015-04-24 NOTE — Telephone Encounter (Signed)
[facility]

## 2015-04-24 NOTE — Progress Notes (Signed)
Patient ID: [name], female   DOB: 1941-02-26, 74 y.o.   MRN: [id number]     [facility]  PCP: Maximino Greenland, MD  Code Status: Full Code   Allergies  Allergen Reactions  . Codeine Swelling    Facial swelling  . Remicade [Infliximab] Anaphylaxis    "sent me into shock"  . Zestril [Lisinopril] Swelling    Face and neck swelling    Chief Complaint  Patient presents with  . New Admit To SNF    New Admission      HPI:  74 y.o. patient is here for short term rehabilitation post hospital admission from 04/17/15-04/22/15 with hypotension and dyspnea. She was treated for acute respiratory failure, Acute diastolic heart failure and possible adrenal insufficiency. She required oxygen, diuresis, iv steroids and antibiotics along with hydration. She has PMH of obesity, ILD, RA, CKD 3, DM, HTN among others. She is seen in her room today. Her breathing is improved per patient. She feels tired. Denies any other concerns this morning.    Review of Systems:  Constitutional: Negative for fever, chills, diaphoresis.  HENT: Negative for headache, congestion, nasal discharge.   Eyes: Negative for eye pain, blurred vision, double vision and discharge.  Respiratory: positive for cough and dyspnea with exertion. On oxygen. Negative for wheezing.   Cardiovascular: Negative for chest pain, palpitations, leg swelling.  Gastrointestinal: Negative for heartburn, nausea, vomiting, abdominal pain. Had bowel movement this am. Had to strain.  Genitourinary: Negative for dysuria, flank pain.  Musculoskeletal: Negative for back pain, falls. Skin: Negative for itching, rash.  Neurological: Negative for dizziness, tingling, focal weakness Psychiatric/Behavioral: Negative for depression.   Past Medical History  Diagnosis Date  . Diabetes mellitus   . Hypertension   . Thyroid disease   . Arthritis     endstage changes bilateral knees/bilateral ankles.   . High cholesterol   .  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 (HCC) 02/17/2012    Continue diplopia, left eye pain and left headaches.    . Depression, reactive   . RA (rheumatoid arthritis) ([place])     has been off methotreaxte since 10/13.  . Contusion of left knee     due to fall 1/14.  [name] URI (upper respiratory infection)   . Clotting disorder ([place])     pt denies this  . Asthma   . Carotid artery occlusion   . Interstitial lung disease ([place])   . Morbid obesity ([place])   . Shortness of breath   . Family history of heart disease   . Chronic kidney disease   . COPD (chronic obstructive pulmonary disease) (HCC)     pulmonary fibrosis  . Normal coronary arteries     cardiac catheterization performed  10/31/14  . Diastolic dysfunction    Past Surgical History  Procedure Laterality Date  . Abdominal hysterectomy    . [place]vary surgery    . Shoulder surgery Left   . Brain surgery      Gamma knife 10/13. Needs repeat spring  '14  . Video bronchoscopy Bilateral 05/31/2013    Procedure: VIDEO BRONCHOSCOPY WITHOUT FLUORO;  Surgeon: Brand Males, MD;  Location: [place];  Service: Cardiopulmonary;  Laterality: Bilateral;  . Video bronscoscopy  2000    lung  . Tonsillectomy  age 89  . Esophagogastroduodenoscopy (egd) with propofol N/A 09/14/2014    Procedure: ESOPHAGOGASTRODUODENOSCOPY (EGD) WITH PROPOFOL;  Surgeon: [name], MD;  Location: [facility];  Service: Endoscopy;  Laterality: N/A;  . Cardiac catheterization N/A 10/31/2014    Procedure: Right/Left Heart Cath and Coronary Angiography;  Surgeon: [name], MD;  Location: [facility];  Service: Cardiovascular;  Laterality: N/A;   Social History:   reports that she has never smoked. She has never used smokeless tobacco. She reports that she does not drink alcohol or use illicit drugs.  Family History  Problem Relation Age of Onset  . Diabetes Mother   . Heart attack Mother   . Hypertension Father   . Lung  cancer Father   . Diabetes Sister   . Diabetes Brother   . Hypertension Brother   . Kidney cancer Brother   . Uterine cancer Daughter   . Breast cancer Sister   . Rheum arthritis Maternal Uncle   . Gout Brother   . Kidney failure Brother     x 5  . Heart disease Brother   . Heart attack Brother     Medications:   Medication List       This list is accurate as of: 04/24/15 10:17 AM.  Always use your most recent med list.               ACCU-CHEK SMARTVIEW test strip  Generic drug:  glucose blood  1 each by Other route every morning.     acetaminophen 500 MG tablet  Commonly known as:  TYLENOL  2 by mouth twice daily for mild pain, 2 by mouth twice daily as needed for moderate painTakes with tramadol     AEROCHAMBER MV inhaler  Use as instructed     albuterol 108 (90 BASE) MCG/ACT inhaler  Commonly known as:  PROVENTIL HFA;VENTOLIN HFA  Inhale 2 puffs into the lungs every 6 (six) hours as needed for wheezing or shortness of breath.     allopurinol 100 MG tablet  Commonly known as:  ZYLOPRIM  Take 2 tablets (200 mg total) by mouth daily.     aspirin EC 81 MG tablet  Take 81 mg by mouth every morning.     atorvastatin 20 MG tablet  Commonly known as:  LIPITOR  Take 10 mg by mouth every evening.     CALCIUM + D3 PO  Take 2 tablets by mouth daily with lunch.     cholecalciferol 1000 UNITS tablet  Commonly known as:  VITAMIN D  Take 2,000 Units by mouth daily with lunch.     colchicine 0.6 MG tablet  Take 0.6 mg by mouth daily as needed (GOUT).     diclofenac sodium 1 % Gel  Commonly known as:  VOLTAREN  Apply 1 application topically 3 (three) times daily.     DULoxetine 60 MG capsule  Commonly known as:  CYMBALTA  Take 1 capsule (60 mg total) by mouth daily.     famotidine 20 MG tablet  Commonly known as:  PEPCID  Take 1 tablet (20 mg total) by mouth at bedtime.     fluticasone 50 MCG/ACT nasal spray  Commonly known as:  FLONASE  Place 2 sprays into  both nostrils daily.     furosemide 40 MG tablet  Commonly known as:  LASIX  Take 1-2 tablets (40-80 mg total) by mouth daily. Alternate doses, take 80 mg one day then 40 mg the next.     gabapentin 600 MG tablet  Commonly known as:  NEURONTIN  Take 1 tablet (600 mg total) by mouth as directed. Take 2 tablet in the morning, 2 tablet  in the middle of the day, and 3 tablets at night     guaiFENesin 600 MG 12 hr tablet  Commonly known as:  MUCINEX  Take 1,200 mg by mouth 2 (two) times daily.     levothyroxine 50 MCG tablet  Commonly known as:  SYNTHROID, LEVOTHROID  Take 1 tablet (50 mcg total) by mouth daily.     metoprolol succinate 100 MG 24 hr tablet  Commonly known as:  TOPROL-XL  Take 100 mg by mouth every morning. Take with or immediately following a meal.     morphine 30 MG 12 hr tablet  Commonly known as:  MS CONTIN  Take 1 tablet (30 mg total) by mouth every 12 (twelve) hours.     morphine 15 MG tablet  Commonly known as:  MSIR  TAKE 1/2 TO 1 TABLET BY MOUTH EVERY 6 HOURS AS NEEDED FOR MODERATE PAIN     multivitamin with minerals Tabs tablet  Take 1 tablet by mouth daily with lunch.     OXYGEN  Inhale 2 L into the lungs at bedtime. And on exertion     pantoprazole 40 MG tablet  Commonly known as:  PROTONIX  TAKE 1 TABLET BY MOUTH DAILY, MINUTES BEFORE IST MEAL  OF THE DAY     predniSONE 20 MG tablet  Commonly known as:  DELTASONE  Take 2 tablets (40 mg total) by mouth daily with breakfast. Take 40 mg daily for 1 week then take 20 mg daily for 1 week then take 10 mg daily until seen by Dr. [name]     sodium chloride 0.65 % Soln nasal spray  Commonly known as:  OCEAN  Place 1 spray into both nostrils as needed for congestion.     VICTOZA 18 MG/3ML Sopn  Generic drug:  Liraglutide  Inject 1.8 mg as directed daily.         Physical Exam: Filed Vitals:   04/24/15 0953  BP: 119/73  Pulse: 88  Temp: 97.1 F (36.2 C)  TempSrc: Oral  Resp: 20  Height:  _0  (1.626 m)  Weight: 302 lb (136.986 kg)  SpO2: 97%    General- elderly female, obese, in no acute distress Head- normocephalic, atraumatic Nose- normal nasal mucosa, no maxillary or frontal sinus tenderness, no nasal discharge Throat- moist mucus membrane  Eyes- PERRLA, EOMI, no pallor, no icterus, no discharge, normal conjunctiva, normal sclera Neck- no cervical lymphadenopathy Cardiovascular- normal s1,s2, 1+ leg edema Respiratory- bilateral poor air entry, no wheeze, no rhonchi, no crackles, no use of accessory muscles, on 3l/min o2 Abdomen- bowel sounds present, soft, non tender Musculoskeletal- able to move all 4 extremities, generalized weakness Neurological- no focal deficit, alert and oriented to person, place and time Skin- warm and dry Psychiatry- normal mood and affect    Labs reviewed: Basic Metabolic Panel:  Recent Labs  10/30/14 1725  04/19/15 0320 04/20/15 0620 04/21/15 0955  NA 139  < > 136 139 138  K 3.0*  < > 3.7 3.2* 3.6  CL 97*  < > 100* 96* 97*  CO2 34*  < > 28 32 34*  GLUCOSE 226*  < > 201* 132* 257*  BUN 31*  < > 23* 18 21*  CREATININE 1.65*  < > 1.35* 1.23* 1.17*  CALCIUM 8.9  < > 8.2* 8.3* 8.4*  MG 1.7  --   --  1.5*  --   PHOS  --   --   --  2.7  --   < > =  values in this interval not displayed. Liver Function Tests:  Recent Labs  10/30/14 1725 04/17/15 1557  AST 21 17  ALT 28 28  ALKPHOS 70 51  BILITOT 0.6 0.8  PROT 5.9* 6.0*  ALBUMIN 3.2* 3.3*   No results for input(s): LIPASE, AMYLASE in the last 8760 hours. No results for input(s): AMMONIA in the last 8760 hours. CBC:  Recent Labs  04/18/15 0530 04/19/15 0320 04/20/15 0620  WBC 7.1 7.5 7.3  HGB 10.7* 11.0* 10.6*  HCT 34.9* 34.6* 34.4*  MCV 96.4 95.1 95.3  PLT 147* 149* 159   Cardiac Enzymes:  Recent Labs  10/30/14 1725 10/30/14 2245 10/31/14 0415  TROPONINI <0.03 <0.03 <0.03   BNP: Invalid input(s): POCBNP CBG:  Recent Labs  04/21/15 2228  04/22/15 0803 04/22/15 1146  GLUCAP 156* 143* 189*    Radiological Exams: Dg Chest 2 View  04/17/2015  CLINICAL DATA:  Chest pain shortness of breath weakness 1 day, cough EXAM: CHEST  2 VIEW COMPARISON:  01/27/2015 FINDINGS: Study limited by body habitus. Cardiac silhouette exaggerated by lordotic positioning likely within normal limits. Mild vascular congestion. Very limited inspiratory effect. Increased opacity at both lung bases. No effusions. IMPRESSION: Bibasilar opacities with very limited lung volumes and limited overall by body habitus. Possibilities include lower lobe atelectasis or pneumonia. Electronically Signed   By: Skipper Cliche M.D.   On: 04/17/2015 16:32    Assessment/Plan  Generalized weakness Will have her work with physical therapy and occupational therapy team to help with gait training and muscle strengthening exercises.fall precautions. Skin care. Encourage to be out of bed.   ILD Continue o2 and wean as tolerated to 2l/min. Continue prednisone tapering course. Continue her breathing treatment, has f/u with pulmonary. Continue mucinex.  Chronic respiratory failure With recent acute worsening from ILD, CHF and CAP. Continue o2 and breathing rx for now and monitor  CHF Recent exacerbation. Monitor weight, continue toprol xl 100 mg daily, lasix 40 mg and 80 mg every other day dosing, check bmp  Neuropathy Continue neurontin 600 mg 2 tab in morning and noon and 3 tab at night time  Spinal stenosis with radiculopathy Continue ms contin 30 mg bid with MS IR 7.5 to 15 mg q6h prn for breakthrough pain. Continue neurontin for neuropathic pain.  CAP Has completed her antibiotic course, monitor clinically  Constipation Add senna s 2 tab qhs and miralax daily. monitor  DM type 2 with neuropathy Monitor cbg. Continue victoza  Lab Results  Component Value Date   HGBA1C 7.0* 10/30/2014   HLD Continue lipitor 10 mg daily  gerd Stable, continue pepcid 20 mg  daily and protonix 40 mg daily  Hypothyroidism Continue synthroid 50 mcg daily  Gout No recent flares, continue allopurinol and prn colchicine for now  Chronic depression Continue duloxetine 60 mg daily  Anemia of chronic disease Monitor cbc  Goals of care: short term rehabilitation   Labs/tests ordered: cbc with diff, cmp 04/30/15  Family/ staff Communication: reviewed care plan with patient and nursing supervisor    Blanchie Serve, MD  [facility] [phone] (Monday-Friday 8 am - 5 pm) [phone] (afterhours)

## 2015-04-26 ENCOUNTER — Encounter (HOSPITAL_COMMUNITY): Payer: Medicare Other

## 2015-04-30 LAB — CBC AND DIFFERENTIAL
HCT: 42 % (ref 36–46)
HEMOGLOBIN: 13 g/dL (ref 12.0–16.0)
Platelets: 216 10*3/uL (ref 150–399)
WBC: 11.9 10*3/mL

## 2015-04-30 LAB — BASIC METABOLIC PANEL
BUN: 20 mg/dL (ref 4–21)
CREATININE: 1.2 mg/dL — AB (ref 0.5–1.1)
GLUCOSE: 84 mg/dL
POTASSIUM: 3.9 mmol/L (ref 3.4–5.3)
SODIUM: 145 mmol/L (ref 137–147)

## 2015-05-01 ENCOUNTER — Encounter (HOSPITAL_COMMUNITY): Payer: Medicare Other

## 2015-05-03 ENCOUNTER — Encounter (HOSPITAL_COMMUNITY): Payer: Medicare Other

## 2015-05-03 LAB — AFB CULTURE WITH SMEAR (NOT AT ARMC): ACID FAST SMEAR: NONE SEEN

## 2015-05-04 ENCOUNTER — Encounter: Payer: Medicare Other | Attending: Physical Medicine & Rehabilitation | Admitting: Registered Nurse

## 2015-05-04 DIAGNOSIS — M4806 Spinal stenosis, lumbar region: Secondary | ICD-10-CM | POA: Insufficient documentation

## 2015-05-04 DIAGNOSIS — Z5181 Encounter for therapeutic drug level monitoring: Secondary | ICD-10-CM | POA: Insufficient documentation

## 2015-05-04 DIAGNOSIS — Z79899 Other long term (current) drug therapy: Secondary | ICD-10-CM | POA: Insufficient documentation

## 2015-05-04 DIAGNOSIS — M069 Rheumatoid arthritis, unspecified: Secondary | ICD-10-CM | POA: Insufficient documentation

## 2015-05-04 DIAGNOSIS — M7062 Trochanteric bursitis, left hip: Secondary | ICD-10-CM | POA: Insufficient documentation

## 2015-05-04 DIAGNOSIS — G894 Chronic pain syndrome: Secondary | ICD-10-CM | POA: Insufficient documentation

## 2015-05-04 DIAGNOSIS — M5416 Radiculopathy, lumbar region: Secondary | ICD-10-CM | POA: Insufficient documentation

## 2015-05-07 ENCOUNTER — Non-Acute Institutional Stay (SKILLED_NURSING_FACILITY): Payer: Medicare Other | Admitting: Nurse Practitioner

## 2015-05-07 DIAGNOSIS — J849 Interstitial pulmonary disease, unspecified: Secondary | ICD-10-CM

## 2015-05-07 DIAGNOSIS — M48061 Spinal stenosis, lumbar region without neurogenic claudication: Secondary | ICD-10-CM

## 2015-05-07 DIAGNOSIS — I5033 Acute on chronic diastolic (congestive) heart failure: Secondary | ICD-10-CM | POA: Diagnosis not present

## 2015-05-07 DIAGNOSIS — E079 Disorder of thyroid, unspecified: Secondary | ICD-10-CM

## 2015-05-07 DIAGNOSIS — D638 Anemia in other chronic diseases classified elsewhere: Secondary | ICD-10-CM

## 2015-05-07 DIAGNOSIS — M4806 Spinal stenosis, lumbar region: Secondary | ICD-10-CM

## 2015-05-07 DIAGNOSIS — E1140 Type 2 diabetes mellitus with diabetic neuropathy, unspecified: Secondary | ICD-10-CM | POA: Diagnosis not present

## 2015-05-07 DIAGNOSIS — M5416 Radiculopathy, lumbar region: Secondary | ICD-10-CM | POA: Diagnosis not present

## 2015-05-07 DIAGNOSIS — M1A9XX0 Chronic gout, unspecified, without tophus (tophi): Secondary | ICD-10-CM

## 2015-05-07 DIAGNOSIS — K5900 Constipation, unspecified: Secondary | ICD-10-CM | POA: Diagnosis not present

## 2015-05-07 DIAGNOSIS — J9611 Chronic respiratory failure with hypoxia: Secondary | ICD-10-CM

## 2015-05-07 NOTE — Progress Notes (Signed)
Patient ID: [name], female   DOB: Jan 31, 1941, 74 y.o.   MRN: [id number]    Nursing Home Location:  [place] of Service: SNF (31)  PCP: Maximino Greenland, MD  Allergies  Allergen Reactions  . Codeine Swelling    Facial swelling  . Remicade [Infliximab] Anaphylaxis    "sent me into shock"  . Zestril [Lisinopril] Swelling    Face and neck swelling    Chief Complaint  Patient presents with  . Discharge Note    HPI:  Patient is a 74 y.o. female seen today at [facility] for discharge home. She has PMH of obesity, ILD, RA, CKD 3, DM, and HTN.  At [facility] for short term rehabilitation after hospitalization from 04/17/15-04/22/15 with hypotension and dyspnea. She was treated for acute respiratory failure, acute diastolic heart failure and possible adrenal insufficiency. She improved with oxygen, diuresis, IV steroids and antibiotics along with hydration.  Patient currently doing well with therapy, now stable to discharge home with home health.   Review of Systems:  Review of Systems  Constitutional: Negative for activity change, appetite change, fatigue and unexpected weight change.  HENT: Negative for congestion and hearing loss.   Eyes: Negative.   Respiratory: Positive for shortness of breath (with exertion). Negative for cough.   Cardiovascular: Negative for chest pain, palpitations and leg swelling.  Gastrointestinal: Negative for abdominal pain, diarrhea and constipation.  Genitourinary: Negative for dysuria and difficulty urinating.  Musculoskeletal: Negative for myalgias and arthralgias.  Skin: Negative for color change and wound.  Neurological: Negative for dizziness and weakness.  Psychiatric/Behavioral: Negative for behavioral problems, confusion and agitation.    Past Medical History  Diagnosis Date  . Diabetes mellitus   . Hypertension   . Thyroid disease   . Arthritis     endstage changes bilateral  knees/bilateral ankles.   . High cholesterol   . 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 (HCC) 02/17/2012    Continue diplopia, left eye pain and left headaches.    . Depression, reactive   . RA (rheumatoid arthritis) ([place])     has been off methotreaxte since 10/13.  . Contusion of left knee     due to fall 1/14.  [name] URI (upper respiratory infection)   . Clotting disorder ([place])     pt denies this  . Asthma   . Carotid artery occlusion   . Interstitial lung disease ([place])   . Morbid obesity ([place])   . Shortness of breath   . Family history of heart disease   . Chronic kidney disease   . COPD (chronic obstructive pulmonary disease) (HCC)     pulmonary fibrosis  . Normal coronary arteries     cardiac catheterization performed  10/31/14  . Diastolic dysfunction    Past Surgical History  Procedure Laterality Date  . Abdominal hysterectomy    . Ovary surgery    . Shoulder surgery Left   . Brain surgery      Gamma knife 10/13. Needs repeat spring  '14  . Video bronchoscopy Bilateral 05/31/2013    Procedure: VIDEO BRONCHOSCOPY WITHOUT FLUORO;  Surgeon: Brand Males, MD;  Location: [place];  Service: Cardiopulmonary;  Laterality: Bilateral;  . Video bronscoscopy  2000    lung  . Tonsillectomy  age 16  . Esophagogastroduodenoscopy (egd) with propofol N/A 09/14/2014    Procedure: ESOPHAGOGASTRODUODENOSCOPY (EGD) WITH PROPOFOL;  Surgeon: [name], MD;  Location: [facility];  Service: Endoscopy;  Laterality: N/A;  . Cardiac catheterization N/A 10/31/2014    Procedure: Right/Left Heart Cath and Coronary Angiography;  Surgeon: [name], MD;  Location: [place] [facility];  Service: Cardiovascular;  Laterality: N/A;   Social History:   reports that she has never smoked. She has never used smokeless tobacco. She reports that she does not drink alcohol or use illicit drugs.  Family History  Problem Relation Age of Onset  . Diabetes Mother   . Heart  attack Mother   . Hypertension Father   . Lung cancer Father   . Diabetes Sister   . Diabetes Brother   . Hypertension Brother   . Kidney cancer Brother   . Uterine cancer Daughter   . Breast cancer Sister   . Rheum arthritis Maternal Uncle   . Gout Brother   . Kidney failure Brother     x 5  . Heart disease Brother   . Heart attack Brother     Medications: Patient's Medications  New Prescriptions   No medications on file  Previous Medications   ACCU-CHEK SMARTVIEW TEST STRIP    1 each by Other route every morning.    ACETAMINOPHEN (TYLENOL) 500 MG TABLET    2 by mouth twice daily for mild pain, 2 by mouth twice daily as needed for moderate painTakes with tramadol   ALBUTEROL (PROVENTIL HFA;VENTOLIN HFA) 108 (90 BASE) MCG/ACT INHALER    Inhale 2 puffs into the lungs every 6 (six) hours as needed for wheezing or shortness of breath.   ALLOPURINOL (ZYLOPRIM) 100 MG TABLET    Take 2 tablets (200 mg total) by mouth daily.   ASPIRIN EC 81 MG TABLET    Take 81 mg by mouth every morning.    ATORVASTATIN (LIPITOR) 20 MG TABLET    Take 10 mg by mouth every evening.    CALCIUM CARB-CHOLECALCIFEROL (CALCIUM + D3 PO)    Take 2 tablets by mouth daily with lunch.    CHOLECALCIFEROL (VITAMIN D) 1000 UNITS TABLET    Take 2,000 Units by mouth daily with lunch.    COLCHICINE 0.6 MG TABLET    Take 0.6 mg by mouth daily as needed (GOUT).   DICLOFENAC SODIUM (VOLTAREN) 1 % GEL    Apply 1 application topically 3 (three) times daily.   DULOXETINE (CYMBALTA) 60 MG CAPSULE    Take 1 capsule (60 mg total) by mouth daily.   FAMOTIDINE (PEPCID) 20 MG TABLET    Take 1 tablet (20 mg total) by mouth at bedtime.   FLUTICASONE (FLONASE) 50 MCG/ACT NASAL SPRAY    Place 2 sprays into both nostrils daily.   FUROSEMIDE (LASIX) 40 MG TABLET    Take 1-2 tablets (40-80 mg total) by mouth daily. Alternate doses, take 80 mg one day then 40 mg the next.   GABAPENTIN (NEURONTIN) 600 MG TABLET    Take 1 tablet (600 mg total)  by mouth as directed. Take 2 tablet in the morning, 2 tablet in the middle of the day, and 3 tablets at night   GUAIFENESIN (MUCINEX) 600 MG 12 HR TABLET    Take 1,200 mg by mouth 2 (two) times daily.   LEVOTHYROXINE (SYNTHROID, LEVOTHROID) 50 MCG TABLET    Take 1 tablet (50 mcg total) by mouth daily.   METOPROLOL SUCCINATE (TOPROL-XL) 100 MG 24 HR TABLET    Take 100 mg by mouth every morning. Take with or immediately following a meal.   MORPHINE (MS CONTIN) 30  MG 12 HR TABLET    Take 1 tablet (30 mg total) by mouth every 12 (twelve) hours.   MORPHINE (MSIR) 15 MG TABLET    Take 1/2 to 1 tablet by mouth every 6 hours as needed for pain.   MULTIPLE VITAMIN (MULTIVITAMIN WITH MINERALS) TABS    Take 1 tablet by mouth daily with lunch.    OXYGEN-HELIUM IN    Inhale 2 L into the lungs at bedtime. And on exertion   PANTOPRAZOLE (PROTONIX) 40 MG TABLET    TAKE 1 TABLET BY MOUTH DAILY, MINUTES BEFORE IST MEAL  OF THE DAY   PREDNISONE (DELTASONE) 20 MG TABLET    Take 2 tablets (40 mg total) by mouth daily with breakfast. Take 40 mg daily for 1 week then take 20 mg daily for 1 week then take 10 mg daily until seen by Dr. [name]   SODIUM CHLORIDE (OCEAN) 0.65 % SOLN NASAL SPRAY    Place 1 spray into both nostrils as needed for congestion.   SPACER/AERO-HOLDING CHAMBERS (AEROCHAMBER MV) INHALER    Use as instructed   VICTOZA 18 MG/3ML SOPN    Inject 1.8 mg as directed daily.   Modified Medications   No medications on file  Discontinued Medications   No medications on file     Physical Exam: Filed Vitals:   05/07/15 1631  BP: 138/98  Pulse: 99  Temp: 97.8 F (36.6 C)  Resp: 20  Weight: 294 lb (133.358 kg)    Physical Exam  Constitutional: She is oriented to person, place, and time. She appears well-developed and well-nourished. No distress.  HENT:  Head: Normocephalic and atraumatic.  Mouth/Throat: Oropharynx is clear and moist. No oropharyngeal exudate.  Eyes: Conjunctivae are normal.  Pupils are equal, round, and reactive to light.  Neck: Normal range of motion. Neck supple.  Cardiovascular: Normal rate, regular rhythm and normal heart sounds.   Pulmonary/Chest: Effort normal and breath sounds normal.  On O2, diminished breath sounds throughout  Abdominal: Soft. Bowel sounds are normal.  Musculoskeletal: She exhibits no edema or tenderness.  Neurological: She is alert and oriented to person, place, and time.  Skin: Skin is warm and dry. She is not diaphoretic.  Psychiatric: She has a normal mood and affect.    Labs reviewed: Basic Metabolic Panel:  Recent Labs  10/30/14 1725  04/19/15 0320 04/20/15 0620 04/21/15 0955 04/30/15  NA 139  < > 136 139 138 145  K 3.0*  < > 3.7 3.2* 3.6 3.9  CL 97*  < > 100* 96* 97*  --   CO2 34*  < > 28 32 34*  --   GLUCOSE 226*  < > 201* 132* 257*  --   BUN 31*  < > 23* 18 21* 20  CREATININE 1.65*  < > 1.35* 1.23* 1.17* 1.2*  CALCIUM 8.9  < > 8.2* 8.3* 8.4*  --   MG 1.7  --   --  1.5*  --   --   PHOS  --   --   --  2.7  --   --   < > = values in this interval not displayed. Liver Function Tests:  Recent Labs  10/30/14 1725 04/17/15 1557  AST 21 17  ALT 28 28  ALKPHOS 70 51  BILITOT 0.6 0.8  PROT 5.9* 6.0*  ALBUMIN 3.2* 3.3*   No results for input(s): LIPASE, AMYLASE in the last 8760 hours. No results for input(s): AMMONIA in the last 8760 hours.  CBC:  Recent Labs  04/18/15 0530 04/19/15 0320 04/20/15 0620 04/30/15  WBC 7.1 7.5 7.3 11.9  HGB 10.7* 11.0* 10.6* 13.0  HCT 34.9* 34.6* 34.4* 42  MCV 96.4 95.1 95.3  --   PLT 147* 149* 159 216   TSH:  Recent Labs  10/30/14 1725  TSH 3.783   A1C: Lab Results  Component Value Date   HGBA1C 7.0* 10/30/2014   Lipid Panel:  Recent Labs  10/31/14 0415  CHOL 168  HDL 49  LDLCALC 102*  TRIG 87  CHOLHDL 3.4      Assessment/Plan 1. ILD (interstitial lung disease) (HCC) Stable, Continues on o2 2l/min. Continue prednisone tapering course. Continue her  mucinex and neb treatments.  2. Chronic respiratory failure with hypoxia (HCC) Recent exacerbation due to COPD, CHF and CAP, stable at this time  3. Acute on chronic diastolic heart failure (HCC) Acute CHF improved after exercerbation. conts on toprol xl 100 mg daily, lasix 40 mg and 80 mg every other day dosing  4. Constipation, unspecified constipation type Stable on current regimen, cont senna S and miralax  5. Anemia of chronic disease hgb remains stable, 13.0 on recent lab  6. 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 Thyroid disease - conts on synthroid 50 mcg daily   8. Spinal stenosis of lumbar region with radiculopathy Stable, conts ms contin 30 mg bid with MS IR 7.5 to 15 mg q6h prn for breakthrough pain. Continue neurontin for neuropathic pain.  9. Chronic gout without tophus, unspecified cause, unspecified site Stable, without recent flare, conts on allopurinol    pt is stable for discharge-will need PT/OT/HHA per home health. No DME needed. Rx written.  will need to follow up with PCP within 2 weeks.   Carlos American. Harle Battiest  [facility] [phone] 8 am - 5 pm) [phone] (after hours)

## 2015-05-08 ENCOUNTER — Encounter (HOSPITAL_COMMUNITY): Payer: Medicare Other

## 2015-05-10 ENCOUNTER — Encounter (HOSPITAL_COMMUNITY): Payer: Medicare Other

## 2015-05-15 ENCOUNTER — Encounter (HOSPITAL_COMMUNITY): Payer: Medicare Other

## 2015-05-16 DIAGNOSIS — M069 Rheumatoid arthritis, unspecified: Secondary | ICD-10-CM | POA: Diagnosis not present

## 2015-05-16 DIAGNOSIS — Z7982 Long term (current) use of aspirin: Secondary | ICD-10-CM | POA: Diagnosis not present

## 2015-05-16 DIAGNOSIS — M1000 Idiopathic gout, unspecified site: Secondary | ICD-10-CM | POA: Diagnosis not present

## 2015-05-16 DIAGNOSIS — Z8701 Personal history of pneumonia (recurrent): Secondary | ICD-10-CM | POA: Diagnosis not present

## 2015-05-16 DIAGNOSIS — J449 Chronic obstructive pulmonary disease, unspecified: Secondary | ICD-10-CM | POA: Diagnosis not present

## 2015-05-16 DIAGNOSIS — I509 Heart failure, unspecified: Secondary | ICD-10-CM | POA: Diagnosis not present

## 2015-05-17 ENCOUNTER — Encounter (HOSPITAL_BASED_OUTPATIENT_CLINIC_OR_DEPARTMENT_OTHER): Payer: Medicare Other | Admitting: Registered Nurse

## 2015-05-17 ENCOUNTER — Encounter: Payer: Self-pay | Admitting: Registered Nurse

## 2015-05-17 ENCOUNTER — Encounter (HOSPITAL_COMMUNITY): Payer: Medicare Other

## 2015-05-17 VITALS — BP 114/64 | HR 100 | Resp 16

## 2015-05-17 DIAGNOSIS — M1711 Unilateral primary osteoarthritis, right knee: Secondary | ICD-10-CM

## 2015-05-17 DIAGNOSIS — Z5181 Encounter for therapeutic drug level monitoring: Secondary | ICD-10-CM

## 2015-05-17 DIAGNOSIS — M47816 Spondylosis without myelopathy or radiculopathy, lumbar region: Secondary | ICD-10-CM | POA: Diagnosis not present

## 2015-05-17 DIAGNOSIS — M7062 Trochanteric bursitis, left hip: Secondary | ICD-10-CM | POA: Diagnosis not present

## 2015-05-17 DIAGNOSIS — M5416 Radiculopathy, lumbar region: Secondary | ICD-10-CM | POA: Diagnosis not present

## 2015-05-17 DIAGNOSIS — I200 Unstable angina: Secondary | ICD-10-CM | POA: Diagnosis not present

## 2015-05-17 DIAGNOSIS — M1712 Unilateral primary osteoarthritis, left knee: Secondary | ICD-10-CM | POA: Diagnosis not present

## 2015-05-17 DIAGNOSIS — Z79899 Other long term (current) drug therapy: Secondary | ICD-10-CM | POA: Diagnosis not present

## 2015-05-17 DIAGNOSIS — M4806 Spinal stenosis, lumbar region: Secondary | ICD-10-CM | POA: Diagnosis not present

## 2015-05-17 DIAGNOSIS — M069 Rheumatoid arthritis, unspecified: Secondary | ICD-10-CM | POA: Diagnosis not present

## 2015-05-17 DIAGNOSIS — G894 Chronic pain syndrome: Secondary | ICD-10-CM | POA: Diagnosis not present

## 2015-05-17 MED ORDER — MORPHINE SULFATE ER 15 MG PO TBCR: EXTENDED_RELEASE_TABLET | ORAL | Status: DC

## 2015-05-17 MED ORDER — MORPHINE SULFATE ER 15 MG PO TBCR: 15.0000 mg | EXTENDED_RELEASE_TABLET | Freq: Two times a day (BID) | ORAL | Status: DC

## 2015-05-17 NOTE — Progress Notes (Signed)
Subjective:    Patient ID: [name], female    DOB: April 08, 1941, 74 y.o.   MRN: [id number]  HPI: [name] is a 74 year old female who returns for follow up appointment and medication refill. She says her pain is located in her lower back radiating into her left lower extremity anteriorly, bilateral knees and lower extremities. She  rates her pain 4. Her current exercise regime is home physical therapy twice a week and  walking in her home with cane or walker.  [name] was admitted to [facility] 04/17/2015 and discharged 04/22/2015 she was transferred to [facility] and discharged 05/10/2015. She was admitted for Acute/Chronic Respiratory Failure Hypoxia and Acute/Chronic Diastolic CHF. [name] asked to weaned off her MS Contin, she's currently on MS Contin 30 mg one tablet every 12 hours, we will decrease her to MS Contin 15 mg one tablet every 8 hours for a week starting on 05/19/2015. On 05/26/2015 MS Contin 15 mg one tablet every 12 hours. This was discussed with Dr. Posey Pronto he agrees with plan. Reviewed with [name] she verbalizes understanding. MSIR not ordered she's not using medication as frequent.   Pain Inventory Average Pain 6 Pain Right Now 4 My pain is sharp  In the last 24 hours, has pain interfered with the following? General activity 7 Relation with others 7 Enjoyment of life 7 What TIME of day is your pain at its worst? morning Sleep (in general) Fair  Pain is worse with: walking, bending, sitting, standing and some activites Pain improves with: rest and therapy/exercise Relief from Meds: NA  Mobility use a walker use a wheelchair transfers alone  Function retired  Neuro/Psych bladder control problems weakness numbness tingling trouble walking dizziness anxiety  Prior Studies x-rays  Physicians involved in your care Primary care . Rheumatologist .   Family History  Problem Relation Age of  Onset  . Diabetes Mother   . Heart attack Mother   . Hypertension Father   . Lung cancer Father   . Diabetes Sister   . Diabetes Brother   . Hypertension Brother   . Kidney cancer Brother   . Uterine cancer Daughter   . Breast cancer Sister   . Rheum arthritis Maternal Uncle   . Gout Brother   . Kidney failure Brother     x 5  . Heart disease Brother   . Heart attack Brother    Social History   Social History  . Marital Status: Married    Spouse Name: N/A  . Number of Children: 6  . Years of Education: college   Occupational History  . retired Marine scientist    Social History Main Topics  . Smoking status: Never Smoker   . Smokeless tobacco: Never Used  . Alcohol Use: No  . Drug Use: No  . Sexual Activity: Not Currently   Other Topics Concern  . None   Social History Narrative   Patient consumes 2-3 cups coffee per day.   Past Surgical History  Procedure Laterality Date  . Abdominal hysterectomy    . Ovary surgery    . Shoulder surgery Left   . Brain surgery      Gamma knife 10/13. Needs repeat spring  '14  . Video bronchoscopy Bilateral 05/31/2013    Procedure: VIDEO BRONCHOSCOPY WITHOUT FLUORO;  Surgeon: Brand Males, MD;  Location: [place];  Service: Cardiopulmonary;  Laterality: Bilateral;  . Video bronscoscopy  2000  lung  . Tonsillectomy  age 46  . Esophagogastroduodenoscopy (egd) with propofol N/A 09/14/2014    Procedure: ESOPHAGOGASTRODUODENOSCOPY (EGD) WITH PROPOFOL;  Surgeon: [name], MD;  [facility];  Service: Endoscopy;  Laterality: N/A;  . Cardiac catheterization N/A 10/31/2014    Procedure: Right/Left Heart Cath and Coronary Angiography;  Surgeon: [name], MD;  Location: [place] [facility];  Service: Cardiovascular;  Laterality: N/A;   Past Medical History  Diagnosis Date  . Diabetes mellitus   . Hypertension   . Thyroid disease   . Arthritis     endstage changes bilateral knees/bilateral ankles.   . High  cholesterol   . 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 (HCC) 02/17/2012    Continue diplopia, left eye pain and left headaches.    . Depression, reactive   . RA (rheumatoid arthritis) ([place])     has been off methotreaxte since 10/13.  . Contusion of left knee     due to fall 1/14.  [name] URI (upper respiratory infection)   . Clotting disorder ([place])     pt denies this  . Asthma   . Carotid artery occlusion   . Interstitial lung disease ([place])   . Morbid obesity ([place])   . Shortness of breath   . Family history of heart disease   . Chronic kidney disease   . COPD (chronic obstructive pulmonary disease) (HCC)     pulmonary fibrosis  . Normal coronary arteries     cardiac catheterization performed  10/31/14  . Diastolic dysfunction    BP 114/64 mmHg  Pulse 105  Resp 16  SpO2 92%  Opioid Risk Score:   Fall Risk Score:  `1  Depression screen PHQ 2/9  Depression screen [facility] 2/9 01/05/2015 09/20/2014 08/10/2013  Decreased Interest 2 3 -  Down, Depressed, Hopeless _0 PHQ - 2 Score _1 Altered sleeping 2 0 -  Tired, decreased energy 2 3 -  Change in appetite 2 3 -  Feeling bad or failure about yourself  0 3 -  Trouble concentrating 1 1 -  Moving slowly or fidgety/restless 0 2 -  Suicidal thoughts 0 0 -  PHQ-9 Score 10 16 -  Difficult doing work/chores Somewhat difficult - -      Review of Systems  Constitutional:       Bladder control problems  Respiratory: Positive for cough, shortness of breath and wheezing.        Respiratory infections  Cardiovascular:       Limb swelling  Genitourinary: Positive for difficulty urinating.  Musculoskeletal: Positive for gait problem.  Neurological: Positive for dizziness, weakness and numbness.       Tingling  Psychiatric/Behavioral: The patient is nervous/anxious.   All other systems reviewed and are negative.      Objective:   Physical Exam  Constitutional: She is oriented to person, place, and time. She  appears well-developed and well-nourished.  HENT:  Head: Normocephalic and atraumatic.  Neck: Normal range of motion. Neck supple.  Cardiovascular: Normal rate and regular rhythm.   Pulmonary/Chest: Effort normal and breath sounds normal.  Musculoskeletal:  Normal Muscle Bulk and Muscle Testing Reveals: Upper Extremities: Full ROM and Muscle Strength 5/5 Thoracic and Lumbar Hypersensitivity Lower Extremities: Full ROM and Muscle Strength 5/5 Bilateral Lower Extremities Flexion Produces Pain into Patella's Arises from chair slolwy/ using walker for support Narrow based Gait  Neurological: She is alert and oriented to person, place, and  time.  Skin: Skin is warm and dry.  Psychiatric: She has a normal mood and affect.  Nursing note and vitals reviewed.         Assessment & Plan:  1. Lumbar spinal stenosis with neurogenic claudication. Associated facet arthropathy: Continue Gabapentin and HEP as tolerated. RX : MS Contin Decreased to  15 mg one tablet every 8 hours  For a week then MS Contin 15 mg one tablet every 12 hours. No script given MSIR 15 mg 1/2 to one tablet every 6 hours as needed. 2. Depression: Continue Cymbalta 3. Diabetes mellitus type 2 with polyneuropathy: Continue Gabapentin 4. Rheumatoid arthritis and osteoarthritis. Contnue Voltaren Gel 5. Interstitial lung disease: Continuous Oxygen and Pulmonology Following.    30 minutes of face to face patient care time was spent during this visit. All questions were encouraged and answered.  F/U in 1 month

## 2015-05-17 NOTE — Patient Instructions (Signed)
Here's Your Schedule for Morphine( MS Contin)  Start Medication  On Saturday  05/19/2015   You will take the MS Contin 15 mg Tablets one tablet  every 8 hours for a week  On 05/26/2015    You will Take MS Contin 51m tablets : One Tablet every 12 hours    You will stay on this medication schedule until you see Dr. SNaaman Plummeron 06/13/2015

## 2015-05-22 DIAGNOSIS — I509 Heart failure, unspecified: Secondary | ICD-10-CM | POA: Diagnosis not present

## 2015-05-22 DIAGNOSIS — M069 Rheumatoid arthritis, unspecified: Secondary | ICD-10-CM | POA: Diagnosis not present

## 2015-05-22 DIAGNOSIS — M1000 Idiopathic gout, unspecified site: Secondary | ICD-10-CM | POA: Diagnosis not present

## 2015-05-22 DIAGNOSIS — J449 Chronic obstructive pulmonary disease, unspecified: Secondary | ICD-10-CM | POA: Diagnosis not present

## 2015-05-22 DIAGNOSIS — Z7982 Long term (current) use of aspirin: Secondary | ICD-10-CM | POA: Diagnosis not present

## 2015-05-22 DIAGNOSIS — Z8701 Personal history of pneumonia (recurrent): Secondary | ICD-10-CM | POA: Diagnosis not present

## 2015-05-23 ENCOUNTER — Encounter: Payer: Self-pay | Admitting: Cardiovascular Disease

## 2015-05-23 ENCOUNTER — Ambulatory Visit (INDEPENDENT_AMBULATORY_CARE_PROVIDER_SITE_OTHER): Payer: Medicare Other | Admitting: Cardiovascular Disease

## 2015-05-23 VITALS — BP 116/78 | HR 96 | Ht 64.0 in | Wt 287.0 lb

## 2015-05-23 DIAGNOSIS — E785 Hyperlipidemia, unspecified: Secondary | ICD-10-CM | POA: Diagnosis not present

## 2015-05-23 DIAGNOSIS — Z7982 Long term (current) use of aspirin: Secondary | ICD-10-CM | POA: Diagnosis not present

## 2015-05-23 DIAGNOSIS — M1000 Idiopathic gout, unspecified site: Secondary | ICD-10-CM | POA: Diagnosis not present

## 2015-05-23 DIAGNOSIS — J449 Chronic obstructive pulmonary disease, unspecified: Secondary | ICD-10-CM | POA: Diagnosis not present

## 2015-05-23 DIAGNOSIS — R0600 Dyspnea, unspecified: Secondary | ICD-10-CM

## 2015-05-23 DIAGNOSIS — Z8701 Personal history of pneumonia (recurrent): Secondary | ICD-10-CM | POA: Diagnosis not present

## 2015-05-23 DIAGNOSIS — R0609 Other forms of dyspnea: Secondary | ICD-10-CM

## 2015-05-23 DIAGNOSIS — I10 Essential (primary) hypertension: Secondary | ICD-10-CM

## 2015-05-23 DIAGNOSIS — M069 Rheumatoid arthritis, unspecified: Secondary | ICD-10-CM | POA: Diagnosis not present

## 2015-05-23 DIAGNOSIS — I509 Heart failure, unspecified: Secondary | ICD-10-CM | POA: Diagnosis not present

## 2015-05-23 NOTE — Patient Instructions (Signed)
Follow up with Dr Berry as needed.  

## 2015-05-23 NOTE — Assessment & Plan Note (Signed)
Ms Bradway  continues to have limiting dyspnea on exertion. 8) left heart cath performed by Dr. Irish Lack 10/31/14 revealed normal coronary arteries, normal LV function and mild pulmonary hypertension. Her echo does show grade 1 diastolic dysfunction. She is on by mouth furosemide in addition to prednisone for rheumatoid arthritis.I do not think that there are any other therapeutic options from a cardiovascular point of view.

## 2015-05-23 NOTE — Assessment & Plan Note (Signed)
History of hypertension with blood pressure measured at 116/78. She is on Toprol. Continue current meds current dosing

## 2015-05-23 NOTE — Progress Notes (Signed)
05/23/2015 [name]   1940-06-21  [id number]  Primary Physician Maximino Greenland, MD Primary Cardiologist: Lorretta Harp MD Renae Gloss   HPI:  [name] is a 75 year old moderately overweight married African American female mother of 6 children, grandmother of a grandchildren referred by [name] for cardiovascular evaluation because of progressive dyspnea on exertion and chest pain. I last saw her in the office 12/22/14.[name] Her cardiovascular risk factor are notable for a strong family history of heart disease with her mother and 55 brothers old with an ischemic heart disease. She has never had a heart attack or stroke. She does have interstitial lung disease as well as rheumatoid arthritis. She is on home oxygen. She also has a brain tumor/meningioma 2 with gamma knife therapy She's had progressive disabling dyspnea as well as daily chest pain. She underwent cardiac catheterization 10/31/14 by Dr. Irish Lack revealing normal coronary arteries and normal LV function. She did have mild pulmonary hypertension. 2-D echo showed normal LV function with grade 1 diastolic dysfunction and mild pulmonary hypertension. Her symptoms really have not changed on by mouth diuretics.   Current Outpatient Prescriptions  Medication Sig Dispense Refill  . ACCU-CHEK SMARTVIEW test strip 1 each by Other route every morning.   1  . acetaminophen (TYLENOL) 500 MG tablet 2 by mouth twice daily for mild pain, 2 by mouth twice daily as needed for moderate painTakes with tramadol    . albuterol (PROVENTIL HFA;VENTOLIN HFA) 108 (90 BASE) MCG/ACT inhaler Inhale 2 puffs into the lungs every 6 (six) hours as needed for wheezing or shortness of breath.    . allopurinol (ZYLOPRIM) 100 MG tablet Take 2 tablets (200 mg total) by mouth daily. 60 tablet 4  . aspirin EC 81 MG tablet Take 81 mg by mouth every morning.     [name] atorvastatin (LIPITOR) 20 MG tablet Take 10 mg by mouth every evening.     .  Calcium Carb-Cholecalciferol (CALCIUM + D3 PO) Take 2 tablets by mouth daily with lunch.     . cholecalciferol (VITAMIN D) 1000 UNITS tablet Take 2,000 Units by mouth daily with lunch.     . colchicine 0.6 MG tablet Take 0.6 mg by mouth daily as needed (GOUT).    [name] diclofenac sodium (VOLTAREN) 1 % GEL Apply 1 application topically 3 (three) times daily. 3 Tube 4  . DULoxetine (CYMBALTA) 60 MG capsule Take 1 capsule (60 mg total) by mouth daily. 30 capsule 4  . famotidine (PEPCID) 20 MG tablet Take 1 tablet (20 mg total) by mouth at bedtime. 30 tablet 11  . fluticasone (FLONASE) 50 MCG/ACT nasal spray Place 2 sprays into both nostrils daily.    . furosemide (LASIX) 40 MG tablet Take 1-2 tablets (40-80 mg total) by mouth daily. Alternate doses, take 80 mg one day then 40 mg the next. 90 tablet 3  . gabapentin (NEURONTIN) 600 MG tablet Take 1 tablet (600 mg total) by mouth as directed. Take 2 tablet in the morning, 2 tablet in the middle of the day, and 3 tablets at night 210 tablet 1  . guaiFENesin (MUCINEX) 600 MG 12 hr tablet Take 1,200 mg by mouth 2 (two) times daily.    [name] levothyroxine (SYNTHROID, LEVOTHROID) 50 MCG tablet Take 1 tablet (50 mcg total) by mouth daily. 30 tablet 1  . metoprolol succinate (TOPROL-XL) 100 MG 24 hr tablet Take 100 mg by mouth every morning. Take with or immediately following a meal.    .  morphine (MS CONTIN) 15 MG 12 hr tablet Take MS Contin 15 mg  (12 hr tablet) one tablet every 8 hours for a week: Start 05/19/2015 On 05/26/2015 Take MS Contin 15 mg (12 hr tablet) One tablet every 12 hours . 62 tablet 0  . morphine (MSIR) 15 MG tablet Take 1/2 to 1 tablet by mouth every 6 hours as needed for pain. 120 tablet 0  . Multiple Vitamin (MULTIVITAMIN WITH MINERALS) TABS Take 1 tablet by mouth daily with lunch.     . OXYGEN-HELIUM IN Inhale 2 L into the lungs at bedtime. And on exertion    . pantoprazole (PROTONIX) 40 MG tablet TAKE 1 TABLET BY MOUTH DAILY, MINUTES BEFORE IST  MEAL  OF THE DAY 30 tablet 5  . predniSONE (DELTASONE) 20 MG tablet Take 2 tablets (40 mg total) by mouth daily with breakfast. Take 40 mg daily for 1 week then take 20 mg daily for 1 week then take 10 mg daily until seen by Dr. [name]    . sodium chloride (OCEAN) 0.65 % SOLN nasal spray Place 1 spray into both nostrils as needed for congestion.    [name] Spacer/Aero-Holding Chambers (AEROCHAMBER MV) inhaler Use as instructed 1 each 0  . VICTOZA 18 MG/3ML SOPN Inject 1.8 mg as directed daily.   0   No current facility-administered medications for this visit.    Allergies  Allergen Reactions  . Codeine Swelling    Facial swelling  . Remicade [Infliximab] Anaphylaxis    "sent me into shock"  . Zestril [Lisinopril] Swelling    Face and neck swelling    Social History   Social History  . Marital Status: Married    Spouse Name: N/A  . Number of Children: 6  . Years of Education: college   Occupational History  . retired Marine scientist    Social History Main Topics  . Smoking status: Never Smoker   . Smokeless tobacco: Never Used  . Alcohol Use: No  . Drug Use: No  . Sexual Activity: Not Currently   Other Topics Concern  . Not on file   Social History Narrative   Patient consumes 2-3 cups coffee per day.     Review of Systems: General: negative for chills, fever, night sweats or weight changes.  Cardiovascular: negative for chest pain, dyspnea on exertion, edema, orthopnea, palpitations, paroxysmal nocturnal dyspnea or shortness of breath Dermatological: negative for rash Respiratory: negative for cough or wheezing Urologic: negative for hematuria Abdominal: negative for nausea, vomiting, diarrhea, bright red blood per rectum, melena, or hematemesis Neurologic: negative for visual changes, syncope, or dizziness All other systems reviewed and are otherwise negative except as noted above.    Blood pressure 116/78, pulse 96, height _0  (1.626 m), weight 287 lb (130.182 kg).    General appearance: alert and no distress Neck: no adenopathy, no carotid bruit, no JVD, supple, symmetrical, trachea midline and thyroid not enlarged, symmetric, no tenderness/mass/nodules Lungs: clear to auscultation bilaterally Heart: regular rate and rhythm, S1, S2 normal, no murmur, click, rub or gallop and mildly tachycardic Extremities: 2-3+ pitting edema bilaterally  EKG not performed today  ASSESSMENT AND PLAN:   Essential hypertension History of hypertension with blood pressure measured at 116/78. She is on Toprol. Continue current meds current dosing  Dyslipidemia History of hyperlipidemia on statin therapy with recent lipid profile performed 10/31/58 Total cholesterol 168, LDL 102 and HDL 49  DOE (dyspnea on exertion) Ms Catalina  continues to have limiting dyspnea on exertion.  8) left heart cath performed by Dr. Irish Lack 10/31/14 revealed normal coronary arteries, normal LV function and mild pulmonary hypertension. Her echo does show grade 1 diastolic dysfunction. She is on by mouth furosemide in addition to prednisone for rheumatoid arthritis.I do not think that there are any other therapeutic options from a cardiovascular point of view.      Lorretta Harp MD FACP,FACC,FAHA, [facility] 05/23/2015 12:03 PM

## 2015-05-23 NOTE — Assessment & Plan Note (Signed)
History of hyperlipidemia on statin therapy with recent lipid profile performed 10/31/58 Total cholesterol 168, LDL 102 and HDL 49

## 2015-05-24 DIAGNOSIS — M069 Rheumatoid arthritis, unspecified: Secondary | ICD-10-CM | POA: Diagnosis not present

## 2015-05-24 DIAGNOSIS — Z7982 Long term (current) use of aspirin: Secondary | ICD-10-CM | POA: Diagnosis not present

## 2015-05-24 DIAGNOSIS — I509 Heart failure, unspecified: Secondary | ICD-10-CM | POA: Diagnosis not present

## 2015-05-24 DIAGNOSIS — Z8701 Personal history of pneumonia (recurrent): Secondary | ICD-10-CM | POA: Diagnosis not present

## 2015-05-24 DIAGNOSIS — M1000 Idiopathic gout, unspecified site: Secondary | ICD-10-CM | POA: Diagnosis not present

## 2015-05-24 DIAGNOSIS — J449 Chronic obstructive pulmonary disease, unspecified: Secondary | ICD-10-CM | POA: Diagnosis not present

## 2015-05-25 DIAGNOSIS — A419 Sepsis, unspecified organism: Secondary | ICD-10-CM | POA: Diagnosis not present

## 2015-05-25 DIAGNOSIS — J189 Pneumonia, unspecified organism: Secondary | ICD-10-CM | POA: Diagnosis not present

## 2015-05-25 DIAGNOSIS — Z09 Encounter for follow-up examination after completed treatment for conditions other than malignant neoplasm: Secondary | ICD-10-CM | POA: Diagnosis not present

## 2015-05-30 DIAGNOSIS — J449 Chronic obstructive pulmonary disease, unspecified: Secondary | ICD-10-CM | POA: Diagnosis not present

## 2015-05-30 DIAGNOSIS — I509 Heart failure, unspecified: Secondary | ICD-10-CM | POA: Diagnosis not present

## 2015-05-30 DIAGNOSIS — Z8701 Personal history of pneumonia (recurrent): Secondary | ICD-10-CM | POA: Diagnosis not present

## 2015-05-30 DIAGNOSIS — M069 Rheumatoid arthritis, unspecified: Secondary | ICD-10-CM | POA: Diagnosis not present

## 2015-05-30 DIAGNOSIS — Z7982 Long term (current) use of aspirin: Secondary | ICD-10-CM | POA: Diagnosis not present

## 2015-05-30 DIAGNOSIS — M1000 Idiopathic gout, unspecified site: Secondary | ICD-10-CM | POA: Diagnosis not present

## 2015-05-31 DIAGNOSIS — J449 Chronic obstructive pulmonary disease, unspecified: Secondary | ICD-10-CM | POA: Diagnosis not present

## 2015-05-31 DIAGNOSIS — M1000 Idiopathic gout, unspecified site: Secondary | ICD-10-CM | POA: Diagnosis not present

## 2015-05-31 DIAGNOSIS — M069 Rheumatoid arthritis, unspecified: Secondary | ICD-10-CM | POA: Diagnosis not present

## 2015-05-31 DIAGNOSIS — Z7982 Long term (current) use of aspirin: Secondary | ICD-10-CM | POA: Diagnosis not present

## 2015-05-31 DIAGNOSIS — H5203 Hypermetropia, bilateral: Secondary | ICD-10-CM | POA: Diagnosis not present

## 2015-05-31 DIAGNOSIS — H2513 Age-related nuclear cataract, bilateral: Secondary | ICD-10-CM | POA: Diagnosis not present

## 2015-05-31 DIAGNOSIS — Z8701 Personal history of pneumonia (recurrent): Secondary | ICD-10-CM | POA: Diagnosis not present

## 2015-05-31 DIAGNOSIS — H04123 Dry eye syndrome of bilateral lacrimal glands: Secondary | ICD-10-CM | POA: Diagnosis not present

## 2015-05-31 DIAGNOSIS — H25013 Cortical age-related cataract, bilateral: Secondary | ICD-10-CM | POA: Diagnosis not present

## 2015-05-31 DIAGNOSIS — I509 Heart failure, unspecified: Secondary | ICD-10-CM | POA: Diagnosis not present

## 2015-05-31 DIAGNOSIS — Z83511 Family history of glaucoma: Secondary | ICD-10-CM | POA: Diagnosis not present

## 2015-06-01 DIAGNOSIS — M069 Rheumatoid arthritis, unspecified: Secondary | ICD-10-CM | POA: Diagnosis not present

## 2015-06-01 DIAGNOSIS — Z7982 Long term (current) use of aspirin: Secondary | ICD-10-CM | POA: Diagnosis not present

## 2015-06-01 DIAGNOSIS — Z8701 Personal history of pneumonia (recurrent): Secondary | ICD-10-CM | POA: Diagnosis not present

## 2015-06-01 DIAGNOSIS — J449 Chronic obstructive pulmonary disease, unspecified: Secondary | ICD-10-CM | POA: Diagnosis not present

## 2015-06-01 DIAGNOSIS — M1000 Idiopathic gout, unspecified site: Secondary | ICD-10-CM | POA: Diagnosis not present

## 2015-06-01 DIAGNOSIS — I509 Heart failure, unspecified: Secondary | ICD-10-CM | POA: Diagnosis not present

## 2015-06-04 DIAGNOSIS — I509 Heart failure, unspecified: Secondary | ICD-10-CM | POA: Diagnosis not present

## 2015-06-04 DIAGNOSIS — Z7982 Long term (current) use of aspirin: Secondary | ICD-10-CM | POA: Diagnosis not present

## 2015-06-04 DIAGNOSIS — J449 Chronic obstructive pulmonary disease, unspecified: Secondary | ICD-10-CM | POA: Diagnosis not present

## 2015-06-04 DIAGNOSIS — M069 Rheumatoid arthritis, unspecified: Secondary | ICD-10-CM | POA: Diagnosis not present

## 2015-06-04 DIAGNOSIS — Z8701 Personal history of pneumonia (recurrent): Secondary | ICD-10-CM | POA: Diagnosis not present

## 2015-06-04 DIAGNOSIS — M1000 Idiopathic gout, unspecified site: Secondary | ICD-10-CM | POA: Diagnosis not present

## 2015-06-05 DIAGNOSIS — M069 Rheumatoid arthritis, unspecified: Secondary | ICD-10-CM | POA: Diagnosis not present

## 2015-06-05 DIAGNOSIS — Z7982 Long term (current) use of aspirin: Secondary | ICD-10-CM | POA: Diagnosis not present

## 2015-06-05 DIAGNOSIS — M1000 Idiopathic gout, unspecified site: Secondary | ICD-10-CM | POA: Diagnosis not present

## 2015-06-05 DIAGNOSIS — J449 Chronic obstructive pulmonary disease, unspecified: Secondary | ICD-10-CM | POA: Diagnosis not present

## 2015-06-05 DIAGNOSIS — I509 Heart failure, unspecified: Secondary | ICD-10-CM | POA: Diagnosis not present

## 2015-06-05 DIAGNOSIS — Z8701 Personal history of pneumonia (recurrent): Secondary | ICD-10-CM | POA: Diagnosis not present

## 2015-06-06 DIAGNOSIS — I509 Heart failure, unspecified: Secondary | ICD-10-CM | POA: Diagnosis not present

## 2015-06-06 DIAGNOSIS — Z7982 Long term (current) use of aspirin: Secondary | ICD-10-CM | POA: Diagnosis not present

## 2015-06-06 DIAGNOSIS — M069 Rheumatoid arthritis, unspecified: Secondary | ICD-10-CM | POA: Diagnosis not present

## 2015-06-06 DIAGNOSIS — J449 Chronic obstructive pulmonary disease, unspecified: Secondary | ICD-10-CM | POA: Diagnosis not present

## 2015-06-06 DIAGNOSIS — M1000 Idiopathic gout, unspecified site: Secondary | ICD-10-CM | POA: Diagnosis not present

## 2015-06-06 DIAGNOSIS — Z8701 Personal history of pneumonia (recurrent): Secondary | ICD-10-CM | POA: Diagnosis not present

## 2015-06-07 DIAGNOSIS — Z7982 Long term (current) use of aspirin: Secondary | ICD-10-CM | POA: Diagnosis not present

## 2015-06-07 DIAGNOSIS — I509 Heart failure, unspecified: Secondary | ICD-10-CM | POA: Diagnosis not present

## 2015-06-07 DIAGNOSIS — M1000 Idiopathic gout, unspecified site: Secondary | ICD-10-CM | POA: Diagnosis not present

## 2015-06-07 DIAGNOSIS — Z8701 Personal history of pneumonia (recurrent): Secondary | ICD-10-CM | POA: Diagnosis not present

## 2015-06-07 DIAGNOSIS — M069 Rheumatoid arthritis, unspecified: Secondary | ICD-10-CM | POA: Diagnosis not present

## 2015-06-07 DIAGNOSIS — J449 Chronic obstructive pulmonary disease, unspecified: Secondary | ICD-10-CM | POA: Diagnosis not present

## 2015-06-11 DIAGNOSIS — M1000 Idiopathic gout, unspecified site: Secondary | ICD-10-CM | POA: Diagnosis not present

## 2015-06-11 DIAGNOSIS — J449 Chronic obstructive pulmonary disease, unspecified: Secondary | ICD-10-CM | POA: Diagnosis not present

## 2015-06-11 DIAGNOSIS — Z7982 Long term (current) use of aspirin: Secondary | ICD-10-CM | POA: Diagnosis not present

## 2015-06-11 DIAGNOSIS — M069 Rheumatoid arthritis, unspecified: Secondary | ICD-10-CM | POA: Diagnosis not present

## 2015-06-11 DIAGNOSIS — Z8701 Personal history of pneumonia (recurrent): Secondary | ICD-10-CM | POA: Diagnosis not present

## 2015-06-11 DIAGNOSIS — I509 Heart failure, unspecified: Secondary | ICD-10-CM | POA: Diagnosis not present

## 2015-06-12 DIAGNOSIS — J449 Chronic obstructive pulmonary disease, unspecified: Secondary | ICD-10-CM | POA: Diagnosis not present

## 2015-06-12 DIAGNOSIS — Z8701 Personal history of pneumonia (recurrent): Secondary | ICD-10-CM | POA: Diagnosis not present

## 2015-06-12 DIAGNOSIS — J849 Interstitial pulmonary disease, unspecified: Secondary | ICD-10-CM | POA: Diagnosis not present

## 2015-06-12 DIAGNOSIS — J479 Bronchiectasis, uncomplicated: Secondary | ICD-10-CM | POA: Diagnosis not present

## 2015-06-12 DIAGNOSIS — M069 Rheumatoid arthritis, unspecified: Secondary | ICD-10-CM | POA: Diagnosis not present

## 2015-06-12 DIAGNOSIS — M1000 Idiopathic gout, unspecified site: Secondary | ICD-10-CM | POA: Diagnosis not present

## 2015-06-12 DIAGNOSIS — R918 Other nonspecific abnormal finding of lung field: Secondary | ICD-10-CM | POA: Diagnosis not present

## 2015-06-12 DIAGNOSIS — I509 Heart failure, unspecified: Secondary | ICD-10-CM | POA: Diagnosis not present

## 2015-06-12 DIAGNOSIS — Z7982 Long term (current) use of aspirin: Secondary | ICD-10-CM | POA: Diagnosis not present

## 2015-06-13 ENCOUNTER — Encounter: Payer: Medicare Other | Attending: Physical Medicine & Rehabilitation | Admitting: Physical Medicine & Rehabilitation

## 2015-06-13 ENCOUNTER — Encounter: Payer: Self-pay | Admitting: Physical Medicine & Rehabilitation

## 2015-06-13 VITALS — BP 100/51 | HR 97 | Resp 14

## 2015-06-13 DIAGNOSIS — M7582 Other shoulder lesions, left shoulder: Secondary | ICD-10-CM | POA: Diagnosis not present

## 2015-06-13 DIAGNOSIS — M47816 Spondylosis without myelopathy or radiculopathy, lumbar region: Secondary | ICD-10-CM

## 2015-06-13 DIAGNOSIS — M5416 Radiculopathy, lumbar region: Secondary | ICD-10-CM | POA: Diagnosis not present

## 2015-06-13 DIAGNOSIS — M1000 Idiopathic gout, unspecified site: Secondary | ICD-10-CM | POA: Diagnosis not present

## 2015-06-13 DIAGNOSIS — M7062 Trochanteric bursitis, left hip: Secondary | ICD-10-CM | POA: Insufficient documentation

## 2015-06-13 DIAGNOSIS — Z8701 Personal history of pneumonia (recurrent): Secondary | ICD-10-CM | POA: Diagnosis not present

## 2015-06-13 DIAGNOSIS — M1712 Unilateral primary osteoarthritis, left knee: Secondary | ICD-10-CM

## 2015-06-13 DIAGNOSIS — Z7982 Long term (current) use of aspirin: Secondary | ICD-10-CM | POA: Diagnosis not present

## 2015-06-13 DIAGNOSIS — Z5181 Encounter for therapeutic drug level monitoring: Secondary | ICD-10-CM | POA: Diagnosis not present

## 2015-06-13 DIAGNOSIS — G894 Chronic pain syndrome: Secondary | ICD-10-CM | POA: Insufficient documentation

## 2015-06-13 DIAGNOSIS — M069 Rheumatoid arthritis, unspecified: Secondary | ICD-10-CM | POA: Insufficient documentation

## 2015-06-13 DIAGNOSIS — M05721 Rheumatoid arthritis with rheumatoid factor of right elbow without organ or systems involvement: Secondary | ICD-10-CM

## 2015-06-13 DIAGNOSIS — M4806 Spinal stenosis, lumbar region: Secondary | ICD-10-CM | POA: Diagnosis not present

## 2015-06-13 DIAGNOSIS — Z79899 Other long term (current) drug therapy: Secondary | ICD-10-CM | POA: Diagnosis not present

## 2015-06-13 DIAGNOSIS — I509 Heart failure, unspecified: Secondary | ICD-10-CM | POA: Diagnosis not present

## 2015-06-13 DIAGNOSIS — J449 Chronic obstructive pulmonary disease, unspecified: Secondary | ICD-10-CM | POA: Diagnosis not present

## 2015-06-13 MED ORDER — MORPHINE SULFATE ER 15 MG PO TBCR: EXTENDED_RELEASE_TABLET | ORAL | Status: DC

## 2015-06-13 MED ORDER — GABAPENTIN 600 MG PO TABS: 600.0000 mg | ORAL_TABLET | Freq: Three times a day (TID) | ORAL | Status: DC

## 2015-06-13 MED ORDER — MORPHINE SULFATE 15 MG PO TABS: ORAL_TABLET | ORAL | Status: DC

## 2015-06-13 MED ORDER — PREGABALIN 50 MG PO CAPS: 50.0000 mg | ORAL_CAPSULE | Freq: Three times a day (TID) | ORAL | Status: DC

## 2015-06-13 NOTE — Patient Instructions (Addendum)
GABAPENTIN :  DECREASE TO 600MG DURING AM AND PM AND 1200MG AT NIGHT FOR 3 DAYS, THEN DECREASE TO 600MG THREE X DAILY THEREAFTER.  LYRICA: START AT 50MG AT BED FOR 3 DAYS THEN 50MG TWICE DAILY FOR 3 DAYS THEN 50MG THREE X DAILY THEREAFTER.    PLEASE CALL ME WITH ANY PROBLEMS OR QUESTIONS (#[phone]).

## 2015-06-13 NOTE — Progress Notes (Signed)
Subjective:    Patient ID: [name], female    DOB: [birth year], 75 y.o.   MRN: [id number]  HPI   [name] is here regarding her chronic pain. Her neuropathic pain is worsening and she is quite frustrated by this. She states that her sugars are under control.  She remains on prednisone however.  She sees pulmonology at [facility] for further treatment options in the near future.   We had discussed options at last visit which included lyrica. Swelling has been an ongoing issue we didn't want to exacerbate. She didn't tolerate the morphine increase i initiated so we changed her to ms cr 36m q8 which has been more tolerable  Additionally she's had increasing pain in her left shoulder and knees---she's been quite limited with any activity she can pursue due to the pain. Her left shoulder pain inhibits her from performing self-care tasks also.    Pain Inventory Average Pain 5 Pain Right Now 6 My pain is intermittent, sharp, burning and aching  In the last 24 hours, has pain interfered with the following? General activity 6 Relation with others 2 Enjoyment of life 2 What TIME of day is your pain at its worst? all Sleep (in general) Fair  Pain is worse with: walking, bending and standing Pain improves with: rest, heat/ice, therapy/exercise, pacing activities and TENS Relief from Meds: 5  Mobility walk with assistance use a walker ability to climb steps?  no do you drive?  no use a wheelchair  Function retired I need assistance with the following:  bathing, meal prep, household duties and shopping  Neuro/Psych bladder control problems weakness numbness dizziness  Prior Studies Any changes since last visit?  no  Physicians involved in your care Any changes since last visit?  no   Family History  Problem Relation Age of Onset  . Diabetes Mother   . Heart attack Mother   . Hypertension Father   . Lung cancer Father   . Diabetes Sister   . Diabetes Brother     . Hypertension Brother   . Kidney cancer Brother   . Uterine cancer Daughter   . Breast cancer Sister   . Rheum arthritis Maternal Uncle   . Gout Brother   . Kidney failure Brother     x 5  . Heart disease Brother   . Heart attack Brother    Social History   Social History  . Marital Status: Married    Spouse Name: N/A  . Number of Children: 6  . Years of Education: college   Occupational History  . 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 Smoking status: Never Smoker   . Smokeless tobacco: Never Used  . Alcohol Use: No  . Drug Use: No  . Sexual Activity: Not Currently   Other Topics Concern  . None   Social History Narrative   Patient consumes 2-3 cups coffee per day.   Past Surgical History  Procedure Laterality Date  . Abdominal hysterectomy    . Ovary surgery    . Shoulder surgery Left   . Brain surgery      Gamma knife 10/13. Needs repeat spring  '14  . Video bronchoscopy Bilateral 05/31/2013    Procedure: VIDEO BRONCHOSCOPY WITHOUT FLUORO;  Surgeon: MBrand Males MD;  [place] [facility]  Service: Cardiopulmonary;  Laterality: Bilateral;  . Video bronscoscopy  2000    lung  . Tonsillectomy  age 75 . Esophagogastroduodenoscopy (egd) with propofol  N/A 09/14/2014    Procedure: ESOPHAGOGASTRODUODENOSCOPY (EGD) WITH PROPOFOL;  Surgeon: [name], MD;  Location: [facility];  Service: Endoscopy;  Laterality: N/A;  . Cardiac catheterization N/A 10/31/2014    Procedure: Right/Left Heart Cath and Coronary Angiography;  Surgeon: [name], MD;  Location: [facility];  Service: Cardiovascular;  Laterality: N/A;   Past Medical History  Diagnosis Date  . Diabetes mellitus   . Hypertension   . Thyroid disease   . Arthritis     endstage changes bilateral knees/bilateral ankles.   . High cholesterol   . 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 (HCC) 02/17/2012    Continue diplopia, left eye pain and left  headaches.    . Depression, reactive   . RA (rheumatoid arthritis) ([place])     has been off methotreaxte since 10/13.  . Contusion of left knee     due to fall 1/14.  [name] URI (upper respiratory infection)   . Clotting disorder ([place])     pt denies this  . Asthma   . Carotid artery occlusion   . Interstitial lung disease ([place])   . Morbid obesity ([place])   . Shortness of breath   . Family history of heart disease   . Chronic kidney disease   . COPD (chronic obstructive pulmonary disease) (HCC)     pulmonary fibrosis  . Normal coronary arteries     cardiac catheterization performed  10/31/14  . Diastolic dysfunction    BP 100/51 mmHg  Pulse 97  Resp 14  SpO2 94%  Opioid Risk Score:   Fall Risk Score:  `1  Depression screen PHQ 2/9  Depression screen [facility] 2/9 01/05/2015 09/20/2014 08/10/2013  Decreased Interest 2 3 -  Down, Depressed, Hopeless _0 PHQ - 2 Score _1 Altered sleeping 2 0 -  Tired, decreased energy 2 3 -  Change in appetite 2 3 -  Feeling bad or failure about yourself  0 3 -  Trouble concentrating 1 1 -  Moving slowly or fidgety/restless 0 2 -  Suicidal thoughts 0 0 -  PHQ-9 Score 10 16 -  Difficult doing work/chores Somewhat difficult - -      Review of Systems  All other systems reviewed and are negative.      Objective:   Physical Exam  General: Alert and oriented x 3, No apparent distress. obese  HEENT: Head is normocephalic, atraumatic, PERRLA, EOMI, sclera anicteric, oral mucosa pink and moist, dentition intact, ext ear canals clear,  Neck: Supple without JVD or lymphadenopathy  Heart: Reg rate and rhythm. No murmurs rubs or gallops  Chest: sob with gait, sometimes at rest. Phonates well  Abdomen: Soft, non-tender, non-distended, bowel sounds positive.  Extremities: No clubbing, cyanosis, 2+ edema still in bilateral legs, negative homan's, no warmth or erythema. Pulses intact.. Measured calf diameter and bother were the same 49.5 cm. Pulses are  2+. Legs are warm,normal temperature  Skin: Clean and intact without signs of breakdown  Neuro: Pt is cognitively appropriate with normal insight, memory, and awareness. Cranial nerves 2-12 are intact. Sensory exam decreased to LT in both legs below the knees.... Reflexes are 2+ in all 4's. Fine motor coordination is intact. No tremors. Motor function is grossly 4 to 5/5 with pain inhbition. Musculoskeletal: low back and left hip less tender. She is slightly elevated in the left pelvis. She has difficulty standing today, comes to office in w/c in fact. She  is tender to touch at both knees in medial and lateral compartments.---left knee is particulalry tender today. Left shoulder tender with IR/ER and impingement maneuvers.  Shins are sensitive as well. Crepitus in both knees with AROM . Fingers lack flexion especially at DIP's. No catches or nodules palpated along volar aspects of fingers.  Psych: Pt's affect is appropriate. Pt is cooperative    Assessment & Plan:   Assessment:  1. Lumbar spinal stenosis with neurogenic claudication. Associated facet arthropathy  2. Depression.  3. Diabetes mellitus type 2 with polyneuropathy  4. Rheumatoid arthritis and osteoarthritis. Multiple joint involvement including knees 5. Left and right greater troch bursitis  6. Interstitial lung disease  7. Multifactorial fatigue---Likely related to her volume/cbg's    Plan:  1. After informed consent and preparation of the skin with betadine and isopropyl alcohol, I injected 38m (1cc) of celestone and 4cc of 1% lidocaine into the left knee via lateral-anterior approach. Additionally, aspiration was performed prior to injection. The patient tolerated well, and no complications were encountered. Afterward the area was cleaned and dressed. Post- injection instructions were provided.   After informed consent and preparation of the skin with betadine and isopropyl alcohol, I injected 649m(1cc) of celestone and 4cc of 1%  lidocaine into the left subacromial space via lateral approach. Additionally, aspiration was performed prior to injection. The patient tolerated well, and no complications were encountered. Afterward the area was cleaned and dressed. Post- injection instructions were provided.    2. . Voltaren gel for fingers/knees. Will also need to increase physical activity as possible. Additionally, we discussed massage, edema mgt, desensitization of her legs.  3. Narcotics:.  -continue MS CR [id number] #90  - MS IR for breakthrough pain, 56m74m/2 to 1 q12 prn, #60  4 . Lyrica trial. Wean gabapentin as we initiate the lyrica. Initially will titrate to 50mg87m and gabapentin down to 300mg 33m 5. Continue cymbalta 60mg d48m. .  6. Follow up with me or NP in 1 month. 30 minutes of face to face patient care time were spent during this visit. All questions were encouraged and answered.

## 2015-06-14 DIAGNOSIS — Z8701 Personal history of pneumonia (recurrent): Secondary | ICD-10-CM | POA: Diagnosis not present

## 2015-06-14 DIAGNOSIS — Z7982 Long term (current) use of aspirin: Secondary | ICD-10-CM | POA: Diagnosis not present

## 2015-06-14 DIAGNOSIS — M069 Rheumatoid arthritis, unspecified: Secondary | ICD-10-CM | POA: Diagnosis not present

## 2015-06-14 DIAGNOSIS — J449 Chronic obstructive pulmonary disease, unspecified: Secondary | ICD-10-CM | POA: Diagnosis not present

## 2015-06-14 DIAGNOSIS — I509 Heart failure, unspecified: Secondary | ICD-10-CM | POA: Diagnosis not present

## 2015-06-14 DIAGNOSIS — M1000 Idiopathic gout, unspecified site: Secondary | ICD-10-CM | POA: Diagnosis not present

## 2015-06-15 DIAGNOSIS — J449 Chronic obstructive pulmonary disease, unspecified: Secondary | ICD-10-CM | POA: Diagnosis not present

## 2015-06-15 DIAGNOSIS — Z8701 Personal history of pneumonia (recurrent): Secondary | ICD-10-CM | POA: Diagnosis not present

## 2015-06-15 DIAGNOSIS — Z7982 Long term (current) use of aspirin: Secondary | ICD-10-CM | POA: Diagnosis not present

## 2015-06-15 DIAGNOSIS — I509 Heart failure, unspecified: Secondary | ICD-10-CM | POA: Diagnosis not present

## 2015-06-15 DIAGNOSIS — M1000 Idiopathic gout, unspecified site: Secondary | ICD-10-CM | POA: Diagnosis not present

## 2015-06-15 DIAGNOSIS — M069 Rheumatoid arthritis, unspecified: Secondary | ICD-10-CM | POA: Diagnosis not present

## 2015-06-16 ENCOUNTER — Telehealth: Payer: Self-pay

## 2015-06-16 NOTE — Telephone Encounter (Signed)
Pt called requesting her Lyrica rx to be faxed to the [facility] medical mail order pharmacy at [phone].

## 2015-06-18 DIAGNOSIS — M1000 Idiopathic gout, unspecified site: Secondary | ICD-10-CM | POA: Diagnosis not present

## 2015-06-18 DIAGNOSIS — M069 Rheumatoid arthritis, unspecified: Secondary | ICD-10-CM | POA: Diagnosis not present

## 2015-06-18 DIAGNOSIS — Z7982 Long term (current) use of aspirin: Secondary | ICD-10-CM | POA: Diagnosis not present

## 2015-06-18 DIAGNOSIS — I509 Heart failure, unspecified: Secondary | ICD-10-CM | POA: Diagnosis not present

## 2015-06-18 DIAGNOSIS — Z8701 Personal history of pneumonia (recurrent): Secondary | ICD-10-CM | POA: Diagnosis not present

## 2015-06-18 DIAGNOSIS — J449 Chronic obstructive pulmonary disease, unspecified: Secondary | ICD-10-CM | POA: Diagnosis not present

## 2015-06-18 DIAGNOSIS — Z79899 Other long term (current) drug therapy: Secondary | ICD-10-CM | POA: Diagnosis not present

## 2015-06-19 DIAGNOSIS — J449 Chronic obstructive pulmonary disease, unspecified: Secondary | ICD-10-CM | POA: Diagnosis not present

## 2015-06-19 DIAGNOSIS — M069 Rheumatoid arthritis, unspecified: Secondary | ICD-10-CM | POA: Diagnosis not present

## 2015-06-19 DIAGNOSIS — Z7982 Long term (current) use of aspirin: Secondary | ICD-10-CM | POA: Diagnosis not present

## 2015-06-19 DIAGNOSIS — M1000 Idiopathic gout, unspecified site: Secondary | ICD-10-CM | POA: Diagnosis not present

## 2015-06-19 DIAGNOSIS — I509 Heart failure, unspecified: Secondary | ICD-10-CM | POA: Diagnosis not present

## 2015-06-19 DIAGNOSIS — Z8701 Personal history of pneumonia (recurrent): Secondary | ICD-10-CM | POA: Diagnosis not present

## 2015-06-20 DIAGNOSIS — M0579 Rheumatoid arthritis with rheumatoid factor of multiple sites without organ or systems involvement: Secondary | ICD-10-CM | POA: Diagnosis not present

## 2015-06-20 DIAGNOSIS — M4056 Lordosis, unspecified, lumbar region: Secondary | ICD-10-CM | POA: Diagnosis not present

## 2015-06-20 DIAGNOSIS — Z79899 Other long term (current) drug therapy: Secondary | ICD-10-CM | POA: Diagnosis not present

## 2015-06-20 DIAGNOSIS — M170 Bilateral primary osteoarthritis of knee: Secondary | ICD-10-CM | POA: Diagnosis not present

## 2015-06-21 DIAGNOSIS — Z7982 Long term (current) use of aspirin: Secondary | ICD-10-CM | POA: Diagnosis not present

## 2015-06-21 DIAGNOSIS — J449 Chronic obstructive pulmonary disease, unspecified: Secondary | ICD-10-CM | POA: Diagnosis not present

## 2015-06-21 DIAGNOSIS — M069 Rheumatoid arthritis, unspecified: Secondary | ICD-10-CM | POA: Diagnosis not present

## 2015-06-21 DIAGNOSIS — M1000 Idiopathic gout, unspecified site: Secondary | ICD-10-CM | POA: Diagnosis not present

## 2015-06-21 DIAGNOSIS — Z8701 Personal history of pneumonia (recurrent): Secondary | ICD-10-CM | POA: Diagnosis not present

## 2015-06-21 DIAGNOSIS — I509 Heart failure, unspecified: Secondary | ICD-10-CM | POA: Diagnosis not present

## 2015-06-21 NOTE — Telephone Encounter (Signed)
Kimmora is asking for her lyrica to be sent to the [place].  By reading the note it looks like her lyrica was started as a trial.What dose should be sent?  She says she is pleased with the lyrica (1 tid) and is taking the gabapentin 1 tid.

## 2015-06-22 DIAGNOSIS — M069 Rheumatoid arthritis, unspecified: Secondary | ICD-10-CM | POA: Diagnosis not present

## 2015-06-22 DIAGNOSIS — Z8701 Personal history of pneumonia (recurrent): Secondary | ICD-10-CM | POA: Diagnosis not present

## 2015-06-22 DIAGNOSIS — Z7982 Long term (current) use of aspirin: Secondary | ICD-10-CM | POA: Diagnosis not present

## 2015-06-22 DIAGNOSIS — J449 Chronic obstructive pulmonary disease, unspecified: Secondary | ICD-10-CM | POA: Diagnosis not present

## 2015-06-22 DIAGNOSIS — M1000 Idiopathic gout, unspecified site: Secondary | ICD-10-CM | POA: Diagnosis not present

## 2015-06-22 DIAGNOSIS — I509 Heart failure, unspecified: Secondary | ICD-10-CM | POA: Diagnosis not present

## 2015-06-22 NOTE — Telephone Encounter (Signed)
Can rx the lyrica 64m TID  #90, 3RF

## 2015-06-25 DIAGNOSIS — Z8701 Personal history of pneumonia (recurrent): Secondary | ICD-10-CM | POA: Diagnosis not present

## 2015-06-25 DIAGNOSIS — J449 Chronic obstructive pulmonary disease, unspecified: Secondary | ICD-10-CM | POA: Diagnosis not present

## 2015-06-25 DIAGNOSIS — I509 Heart failure, unspecified: Secondary | ICD-10-CM | POA: Diagnosis not present

## 2015-06-25 DIAGNOSIS — Z7982 Long term (current) use of aspirin: Secondary | ICD-10-CM | POA: Diagnosis not present

## 2015-06-25 DIAGNOSIS — M1000 Idiopathic gout, unspecified site: Secondary | ICD-10-CM | POA: Diagnosis not present

## 2015-06-25 DIAGNOSIS — M069 Rheumatoid arthritis, unspecified: Secondary | ICD-10-CM | POA: Diagnosis not present

## 2015-06-26 DIAGNOSIS — Z8701 Personal history of pneumonia (recurrent): Secondary | ICD-10-CM | POA: Diagnosis not present

## 2015-06-26 DIAGNOSIS — Z7982 Long term (current) use of aspirin: Secondary | ICD-10-CM | POA: Diagnosis not present

## 2015-06-26 DIAGNOSIS — J449 Chronic obstructive pulmonary disease, unspecified: Secondary | ICD-10-CM | POA: Diagnosis not present

## 2015-06-26 DIAGNOSIS — I509 Heart failure, unspecified: Secondary | ICD-10-CM | POA: Diagnosis not present

## 2015-06-26 DIAGNOSIS — M069 Rheumatoid arthritis, unspecified: Secondary | ICD-10-CM | POA: Diagnosis not present

## 2015-06-26 DIAGNOSIS — M1000 Idiopathic gout, unspecified site: Secondary | ICD-10-CM | POA: Diagnosis not present

## 2015-06-26 MED ORDER — PREGABALIN 50 MG PO CAPS: 50.0000 mg | ORAL_CAPSULE | Freq: Three times a day (TID) | ORAL | Status: DC

## 2015-06-26 NOTE — Telephone Encounter (Signed)
Called the [place] mail order pharmacy, rx's can only be faxed. Rx printed, awaiting signature so it can be faxed

## 2015-06-27 DIAGNOSIS — Z8701 Personal history of pneumonia (recurrent): Secondary | ICD-10-CM | POA: Diagnosis not present

## 2015-06-27 DIAGNOSIS — I509 Heart failure, unspecified: Secondary | ICD-10-CM | POA: Diagnosis not present

## 2015-06-27 DIAGNOSIS — Z7982 Long term (current) use of aspirin: Secondary | ICD-10-CM | POA: Diagnosis not present

## 2015-06-27 DIAGNOSIS — J449 Chronic obstructive pulmonary disease, unspecified: Secondary | ICD-10-CM | POA: Diagnosis not present

## 2015-06-27 DIAGNOSIS — M1000 Idiopathic gout, unspecified site: Secondary | ICD-10-CM | POA: Diagnosis not present

## 2015-06-27 DIAGNOSIS — M069 Rheumatoid arthritis, unspecified: Secondary | ICD-10-CM | POA: Diagnosis not present

## 2015-06-27 NOTE — Telephone Encounter (Signed)
rx signed and transmitted via fax

## 2015-06-28 DIAGNOSIS — J449 Chronic obstructive pulmonary disease, unspecified: Secondary | ICD-10-CM | POA: Diagnosis not present

## 2015-06-28 DIAGNOSIS — Z7982 Long term (current) use of aspirin: Secondary | ICD-10-CM | POA: Diagnosis not present

## 2015-06-28 DIAGNOSIS — M069 Rheumatoid arthritis, unspecified: Secondary | ICD-10-CM | POA: Diagnosis not present

## 2015-06-28 DIAGNOSIS — Z8701 Personal history of pneumonia (recurrent): Secondary | ICD-10-CM | POA: Diagnosis not present

## 2015-06-28 DIAGNOSIS — M1000 Idiopathic gout, unspecified site: Secondary | ICD-10-CM | POA: Diagnosis not present

## 2015-06-28 DIAGNOSIS — I509 Heart failure, unspecified: Secondary | ICD-10-CM | POA: Diagnosis not present

## 2015-06-29 DIAGNOSIS — J449 Chronic obstructive pulmonary disease, unspecified: Secondary | ICD-10-CM | POA: Diagnosis not present

## 2015-06-29 DIAGNOSIS — Z8701 Personal history of pneumonia (recurrent): Secondary | ICD-10-CM | POA: Diagnosis not present

## 2015-06-29 DIAGNOSIS — Z7982 Long term (current) use of aspirin: Secondary | ICD-10-CM | POA: Diagnosis not present

## 2015-06-29 DIAGNOSIS — M1000 Idiopathic gout, unspecified site: Secondary | ICD-10-CM | POA: Diagnosis not present

## 2015-06-29 DIAGNOSIS — M069 Rheumatoid arthritis, unspecified: Secondary | ICD-10-CM | POA: Diagnosis not present

## 2015-06-29 DIAGNOSIS — I509 Heart failure, unspecified: Secondary | ICD-10-CM | POA: Diagnosis not present

## 2015-07-02 DIAGNOSIS — M059 Rheumatoid arthritis with rheumatoid factor, unspecified: Secondary | ICD-10-CM | POA: Diagnosis not present

## 2015-07-02 DIAGNOSIS — J8410 Pulmonary fibrosis, unspecified: Secondary | ICD-10-CM | POA: Diagnosis not present

## 2015-07-02 DIAGNOSIS — Z6841 Body Mass Index (BMI) 40.0 and over, adult: Secondary | ICD-10-CM | POA: Diagnosis not present

## 2015-07-02 DIAGNOSIS — R918 Other nonspecific abnormal finding of lung field: Secondary | ICD-10-CM | POA: Diagnosis not present

## 2015-07-02 DIAGNOSIS — J479 Bronchiectasis, uncomplicated: Secondary | ICD-10-CM | POA: Diagnosis not present

## 2015-07-03 DIAGNOSIS — Z8701 Personal history of pneumonia (recurrent): Secondary | ICD-10-CM | POA: Diagnosis not present

## 2015-07-03 DIAGNOSIS — I509 Heart failure, unspecified: Secondary | ICD-10-CM | POA: Diagnosis not present

## 2015-07-03 DIAGNOSIS — M069 Rheumatoid arthritis, unspecified: Secondary | ICD-10-CM | POA: Diagnosis not present

## 2015-07-03 DIAGNOSIS — M1000 Idiopathic gout, unspecified site: Secondary | ICD-10-CM | POA: Diagnosis not present

## 2015-07-03 DIAGNOSIS — Z7982 Long term (current) use of aspirin: Secondary | ICD-10-CM | POA: Diagnosis not present

## 2015-07-03 DIAGNOSIS — J449 Chronic obstructive pulmonary disease, unspecified: Secondary | ICD-10-CM | POA: Diagnosis not present

## 2015-07-04 DIAGNOSIS — J449 Chronic obstructive pulmonary disease, unspecified: Secondary | ICD-10-CM | POA: Diagnosis not present

## 2015-07-04 DIAGNOSIS — M069 Rheumatoid arthritis, unspecified: Secondary | ICD-10-CM | POA: Diagnosis not present

## 2015-07-04 DIAGNOSIS — I509 Heart failure, unspecified: Secondary | ICD-10-CM | POA: Diagnosis not present

## 2015-07-04 DIAGNOSIS — Z7982 Long term (current) use of aspirin: Secondary | ICD-10-CM | POA: Diagnosis not present

## 2015-07-04 DIAGNOSIS — Z8701 Personal history of pneumonia (recurrent): Secondary | ICD-10-CM | POA: Diagnosis not present

## 2015-07-04 DIAGNOSIS — M1000 Idiopathic gout, unspecified site: Secondary | ICD-10-CM | POA: Diagnosis not present

## 2015-07-05 DIAGNOSIS — Z8701 Personal history of pneumonia (recurrent): Secondary | ICD-10-CM | POA: Diagnosis not present

## 2015-07-05 DIAGNOSIS — Z7982 Long term (current) use of aspirin: Secondary | ICD-10-CM | POA: Diagnosis not present

## 2015-07-05 DIAGNOSIS — M1000 Idiopathic gout, unspecified site: Secondary | ICD-10-CM | POA: Diagnosis not present

## 2015-07-05 DIAGNOSIS — J449 Chronic obstructive pulmonary disease, unspecified: Secondary | ICD-10-CM | POA: Diagnosis not present

## 2015-07-05 DIAGNOSIS — M069 Rheumatoid arthritis, unspecified: Secondary | ICD-10-CM | POA: Diagnosis not present

## 2015-07-05 DIAGNOSIS — I509 Heart failure, unspecified: Secondary | ICD-10-CM | POA: Diagnosis not present

## 2015-07-06 DIAGNOSIS — M1000 Idiopathic gout, unspecified site: Secondary | ICD-10-CM | POA: Diagnosis not present

## 2015-07-06 DIAGNOSIS — E039 Hypothyroidism, unspecified: Secondary | ICD-10-CM | POA: Diagnosis not present

## 2015-07-06 DIAGNOSIS — I509 Heart failure, unspecified: Secondary | ICD-10-CM | POA: Diagnosis not present

## 2015-07-06 DIAGNOSIS — E785 Hyperlipidemia, unspecified: Secondary | ICD-10-CM | POA: Diagnosis not present

## 2015-07-06 DIAGNOSIS — D329 Benign neoplasm of meninges, unspecified: Secondary | ICD-10-CM | POA: Diagnosis not present

## 2015-07-06 DIAGNOSIS — N2581 Secondary hyperparathyroidism of renal origin: Secondary | ICD-10-CM | POA: Diagnosis not present

## 2015-07-06 DIAGNOSIS — N183 Chronic kidney disease, stage 3 (moderate): Secondary | ICD-10-CM | POA: Diagnosis not present

## 2015-07-06 DIAGNOSIS — Z8701 Personal history of pneumonia (recurrent): Secondary | ICD-10-CM | POA: Diagnosis not present

## 2015-07-06 DIAGNOSIS — I129 Hypertensive chronic kidney disease with stage 1 through stage 4 chronic kidney disease, or unspecified chronic kidney disease: Secondary | ICD-10-CM | POA: Diagnosis not present

## 2015-07-06 DIAGNOSIS — Z7982 Long term (current) use of aspirin: Secondary | ICD-10-CM | POA: Diagnosis not present

## 2015-07-06 DIAGNOSIS — M069 Rheumatoid arthritis, unspecified: Secondary | ICD-10-CM | POA: Diagnosis not present

## 2015-07-06 DIAGNOSIS — J449 Chronic obstructive pulmonary disease, unspecified: Secondary | ICD-10-CM | POA: Diagnosis not present

## 2015-07-06 DIAGNOSIS — E1129 Type 2 diabetes mellitus with other diabetic kidney complication: Secondary | ICD-10-CM | POA: Diagnosis not present

## 2015-07-09 DIAGNOSIS — J8489 Other specified interstitial pulmonary diseases: Secondary | ICD-10-CM | POA: Diagnosis not present

## 2015-07-09 DIAGNOSIS — R531 Weakness: Secondary | ICD-10-CM | POA: Diagnosis not present

## 2015-07-09 DIAGNOSIS — Z9981 Dependence on supplemental oxygen: Secondary | ICD-10-CM | POA: Diagnosis not present

## 2015-07-09 DIAGNOSIS — Z4689 Encounter for fitting and adjustment of other specified devices: Secondary | ICD-10-CM | POA: Diagnosis not present

## 2015-07-10 DIAGNOSIS — I509 Heart failure, unspecified: Secondary | ICD-10-CM | POA: Diagnosis not present

## 2015-07-10 DIAGNOSIS — M069 Rheumatoid arthritis, unspecified: Secondary | ICD-10-CM | POA: Diagnosis not present

## 2015-07-10 DIAGNOSIS — J449 Chronic obstructive pulmonary disease, unspecified: Secondary | ICD-10-CM | POA: Diagnosis not present

## 2015-07-10 DIAGNOSIS — Z8701 Personal history of pneumonia (recurrent): Secondary | ICD-10-CM | POA: Diagnosis not present

## 2015-07-10 DIAGNOSIS — M1000 Idiopathic gout, unspecified site: Secondary | ICD-10-CM | POA: Diagnosis not present

## 2015-07-10 DIAGNOSIS — Z7982 Long term (current) use of aspirin: Secondary | ICD-10-CM | POA: Diagnosis not present

## 2015-07-11 DIAGNOSIS — Z7982 Long term (current) use of aspirin: Secondary | ICD-10-CM | POA: Diagnosis not present

## 2015-07-11 DIAGNOSIS — M069 Rheumatoid arthritis, unspecified: Secondary | ICD-10-CM | POA: Diagnosis not present

## 2015-07-11 DIAGNOSIS — M1000 Idiopathic gout, unspecified site: Secondary | ICD-10-CM | POA: Diagnosis not present

## 2015-07-11 DIAGNOSIS — J449 Chronic obstructive pulmonary disease, unspecified: Secondary | ICD-10-CM | POA: Diagnosis not present

## 2015-07-11 DIAGNOSIS — Z8701 Personal history of pneumonia (recurrent): Secondary | ICD-10-CM | POA: Diagnosis not present

## 2015-07-11 DIAGNOSIS — I509 Heart failure, unspecified: Secondary | ICD-10-CM | POA: Diagnosis not present

## 2015-07-12 ENCOUNTER — Encounter: Payer: Medicare Other | Attending: Physical Medicine & Rehabilitation | Admitting: Registered Nurse

## 2015-07-12 ENCOUNTER — Encounter: Payer: Self-pay | Admitting: Registered Nurse

## 2015-07-12 VITALS — BP 100/63 | HR 94 | Resp 14

## 2015-07-12 DIAGNOSIS — M069 Rheumatoid arthritis, unspecified: Secondary | ICD-10-CM | POA: Insufficient documentation

## 2015-07-12 DIAGNOSIS — M5416 Radiculopathy, lumbar region: Secondary | ICD-10-CM | POA: Diagnosis not present

## 2015-07-12 DIAGNOSIS — M1711 Unilateral primary osteoarthritis, right knee: Secondary | ICD-10-CM

## 2015-07-12 DIAGNOSIS — M47816 Spondylosis without myelopathy or radiculopathy, lumbar region: Secondary | ICD-10-CM | POA: Diagnosis not present

## 2015-07-12 DIAGNOSIS — Z79899 Other long term (current) drug therapy: Secondary | ICD-10-CM | POA: Insufficient documentation

## 2015-07-12 DIAGNOSIS — G894 Chronic pain syndrome: Secondary | ICD-10-CM | POA: Insufficient documentation

## 2015-07-12 DIAGNOSIS — Z5181 Encounter for therapeutic drug level monitoring: Secondary | ICD-10-CM | POA: Insufficient documentation

## 2015-07-12 DIAGNOSIS — I509 Heart failure, unspecified: Secondary | ICD-10-CM | POA: Diagnosis not present

## 2015-07-12 DIAGNOSIS — M4806 Spinal stenosis, lumbar region: Secondary | ICD-10-CM | POA: Insufficient documentation

## 2015-07-12 DIAGNOSIS — M7062 Trochanteric bursitis, left hip: Secondary | ICD-10-CM | POA: Insufficient documentation

## 2015-07-12 DIAGNOSIS — M1000 Idiopathic gout, unspecified site: Secondary | ICD-10-CM | POA: Diagnosis not present

## 2015-07-12 DIAGNOSIS — Z8701 Personal history of pneumonia (recurrent): Secondary | ICD-10-CM | POA: Diagnosis not present

## 2015-07-12 DIAGNOSIS — M1712 Unilateral primary osteoarthritis, left knee: Secondary | ICD-10-CM

## 2015-07-12 DIAGNOSIS — J449 Chronic obstructive pulmonary disease, unspecified: Secondary | ICD-10-CM | POA: Diagnosis not present

## 2015-07-12 DIAGNOSIS — Z7982 Long term (current) use of aspirin: Secondary | ICD-10-CM | POA: Diagnosis not present

## 2015-07-12 NOTE — Patient Instructions (Signed)
Take your Morphine 30 mg one tablet every 12 hours  6 am and 6 pm  Take your Morphine 15 Immediate release one half to one tablet  Every 6 hours as needed for break through pain.   Call the Office on  March 2 nd leave me message how you are tolerating the medication. Or leave me a message to return your call  [phone]

## 2015-07-12 NOTE — Progress Notes (Signed)
Subjective:    Patient ID: [name], female    DOB: Mar 02, 1941, 75 y.o.   MRN: [id number]  HPI: [name] is a 75 year old female who returns for follow up appointment and medication refill. She states her pain is located in her lower back  and bilateral knees. Also states her pain has intensified. In December she was given instruction to wean off her MS Contin as she requested. She seen Dr. Naaman Plummer last month and was given MS Contin 15 mg and MSIR 15 mg for breakthrough pain. Ms. Cummings arrived to office with MS Contin 30 mg she has 49 tablets, she realizes she has not been taking her medication as prescribed. Reviewed her medications and schedule, instructions were given. She did not want to destroy her MS Contin 30 mg tablets. She will follow the instructions given, also instructed to call office in a week for medication evaluation. She verbalizes understanding. Husband in room and reviewed the instructions with him also. I asked Ms. Vandevender to ask her daughter to help her and to purchase a pill container. She verbalizes understanding. No prescriptions were given today. She rates her pain 4. Her current exercise regime is  walking in her home with cane or walker. She arrived in wheelchair.  Pain Inventory Average Pain 5 Pain Right Now 4 My pain is intermittent, sharp, burning, stabbing and aching  In the last 24 hours, has pain interfered with the following? General activity 10 Relation with others 7 Enjoyment of life 7 What TIME of day is your pain at its worst? morning Sleep (in general) NA  Pain is worse with: walking, bending, sitting, inactivity and standing Pain improves with: NA Relief from Meds: 7  Mobility use a cane ability to climb steps?  no do you drive?  no  Function retired  Neuro/Psych weakness numbness tingling  Prior Studies CT/MRI  Physicians involved in your care Primary care .   Family History  Problem Relation Age of Onset    . Diabetes Mother   . Heart attack Mother   . Hypertension Father   . Lung cancer Father   . Diabetes Sister   . Diabetes Brother   . Hypertension Brother   . Kidney cancer Brother   . Uterine cancer Daughter   . Breast cancer Sister   . Rheum arthritis Maternal Uncle   . Gout Brother   . Kidney failure Brother     x 5  . Heart disease Brother   . Heart attack Brother    Social History   Social History  . Marital Status: Married    Spouse Name: N/A  . Number of Children: 6  . Years of Education: college   Occupational History  . retired Marine scientist    Social History Main Topics  . Smoking status: Never Smoker   . Smokeless tobacco: Never Used  . Alcohol Use: No  . Drug Use: No  . Sexual Activity: Not Currently   Other Topics Concern  . None   Social History Narrative   Patient consumes 2-3 cups coffee per day.   Past Surgical History  Procedure Laterality Date  . Abdominal hysterectomy    . Ovary surgery    . Shoulder surgery Left   . Brain surgery      Gamma knife 10/13. Needs repeat spring  '14  . Video bronchoscopy Bilateral 05/31/2013    Procedure: VIDEO BRONCHOSCOPY WITHOUT FLUORO;  Surgeon: Brand Males, MD;  Location: [facility] ENDOSCOPY;  Service: Cardiopulmonary;  Laterality: Bilateral;  . Video bronscoscopy  2000    lung  . Tonsillectomy  age 55  . Esophagogastroduodenoscopy (egd) with propofol N/A 09/14/2014    Procedure: ESOPHAGOGASTRODUODENOSCOPY (EGD) WITH PROPOFOL;  Surgeon: [name], MD;  Location: [facility] ENDOSCOPY;  Service: Endoscopy;  Laterality: N/A;  . Cardiac catheterization N/A 10/31/2014    Procedure: Right/Left Heart Cath and Coronary Angiography;  Surgeon: [name], MD;  Location: [facility];  Service: Cardiovascular;  Laterality: N/A;   Past Medical History  Diagnosis Date  . Diabetes mellitus   . Hypertension   . Thyroid disease   . Arthritis     endstage changes bilateral knees/bilateral ankles.   . High  cholesterol   . 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 (HCC) 02/17/2012    Continue diplopia, left eye pain and left headaches.    . Depression, reactive   . RA (rheumatoid arthritis) ([place])     has been off methotreaxte since 10/13.  . Contusion of left knee     due to fall 1/14.  [name] URI (upper respiratory infection)   . Clotting disorder ([place])     pt denies this  . Asthma   . Carotid artery occlusion   . Interstitial lung disease ([place])   . Morbid obesity ([place])   . Shortness of breath   . Family history of heart disease   . Chronic kidney disease   . COPD (chronic obstructive pulmonary disease) (HCC)     pulmonary fibrosis  . Normal coronary arteries     cardiac catheterization performed  10/31/14  . Diastolic dysfunction    BP 100/63 mmHg  Pulse 94  Resp 14  SpO2 93%  Opioid Risk Score:   Fall Risk Score:  `1  Depression screen PHQ 2/9  Depression screen [facility] 2/9 01/05/2015 09/20/2014 08/10/2013  Decreased Interest 2 3 -  Down, Depressed, Hopeless _0 PHQ - 2 Score _1 Altered sleeping 2 0 -  Tired, decreased energy 2 3 -  Change in appetite 2 3 -  Feeling bad or failure about yourself  0 3 -  Trouble concentrating 1 1 -  Moving slowly or fidgety/restless 0 2 -  Suicidal thoughts 0 0 -  PHQ-9 Score 10 16 -  Difficult doing work/chores Somewhat difficult - -     Review of Systems  Respiratory: Positive for cough, shortness of breath and wheezing.   Cardiovascular:       Limb swelling   Gastrointestinal: Positive for constipation.  Neurological: Positive for weakness and numbness.       Tingling   All other systems reviewed and are negative.      Objective:   Physical Exam  Constitutional: She is oriented to person, place, and time. She appears well-developed and well-nourished.  Morbid Obesity  HENT:  Head: Normocephalic and atraumatic.  Neck: Normal range of motion. Neck supple.  Cardiovascular: Normal rate and regular rhythm.     Pulmonary/Chest: Breath sounds normal.  Musculoskeletal: She exhibits edema.  Normal Muscle Bulk and Muscle Testing Reveals: Upper Extremities: Full ROM and Muscle Strength 5/5 Lumbar Hypersensitivity Lowe Extremities: Decreased ROM and Muscle Strength 5/5 Bilateral Lower Extremity Flexion Produces Pain into Patella Arrived in wheelchair  Neurological: She is alert and oriented to person, place, and time.  Skin: Skin is warm and dry.  Psychiatric: She has a normal mood and affect.  Nursing note and vitals reviewed.  Assessment & Plan:  1. Lumbar spinal stenosis with neurogenic claudication. Associated facet arthropathy: Continue Gabapentin and HEP as tolerated. Continue : MS Contin 30 mg one tablet every 12 hoursand  MSIR 15 mg 1/2 to one tablet every 6 hours as needed. No Prescriptions Given 2. Depression: Continue Cymbalta 3. Diabetes mellitus type 2 with polyneuropathy: Continue Gabapentin 4. Rheumatoid arthritis and osteoarthritis. Contnue Voltaren Gel 5. Interstitial lung disease: Continuous Oxygen and Pulmonology Following.    60 minutes of face to face patient care time was spent during this visit. All questions were encouraged and answered.  F/U in 1 month

## 2015-07-17 ENCOUNTER — Ambulatory Visit (INDEPENDENT_AMBULATORY_CARE_PROVIDER_SITE_OTHER): Payer: Medicare Other | Admitting: Internal Medicine

## 2015-07-17 ENCOUNTER — Encounter: Payer: Self-pay | Admitting: Internal Medicine

## 2015-07-17 VITALS — BP 128/72 | HR 98 | Ht 64.0 in | Wt 300.0 lb

## 2015-07-17 DIAGNOSIS — J9611 Chronic respiratory failure with hypoxia: Secondary | ICD-10-CM | POA: Diagnosis not present

## 2015-07-17 DIAGNOSIS — R0600 Dyspnea, unspecified: Secondary | ICD-10-CM | POA: Diagnosis not present

## 2015-07-17 DIAGNOSIS — Z79899 Other long term (current) drug therapy: Secondary | ICD-10-CM

## 2015-07-17 DIAGNOSIS — J849 Interstitial pulmonary disease, unspecified: Secondary | ICD-10-CM | POA: Diagnosis not present

## 2015-07-17 DIAGNOSIS — R0689 Other abnormalities of breathing: Secondary | ICD-10-CM

## 2015-07-17 NOTE — Patient Instructions (Signed)
ICD-9-CM ICD-10-CM   1. ILD (interstitial lung disease) ([place]) 515 J84.9   2. Chronic respiratory failure with hypoxia (HCC) 518.83 J96.11    799.02    3. High risk medication use V58.69 Z79.899   4. Dyspnea and respiratory abnormality 786.09 R06.00     R06.89     Please await recommendations from [facility] Till then continue prednisone and oxygen therapy as before You are in a very difficult position with her health - my best recommendation is to focus on her quality of life and symptoms  - Refer to home palliative care program  Follow-up - 3 months or sooner if needed

## 2015-07-17 NOTE — Progress Notes (Signed)
OV 01/03/2015  Chief Complaint  Patient presents with  . Follow-up    Pt c/o DOE, resolving cough that started over the weekend, chest discomfort when SOB and when lying down. Pt c/o increasing fatigue.    She has severe class 3 dyspnea with hypoxemia on account of BMI 50, diaast dsyfn, chronic pain, deconditioning and ILD due to RA on celcept/pred/bactrim   I personally last saw her in march 2-016. Since then has seen other provicers in this office and most recently in June 2016. In June 2016 admitted for acute CHF - cath showed mild pulm htn only with PA mean 24 and minimal CAD (reviewed chart of procedures June 2016). Dx as acute diast chf. Since then better. SHe had fu HRCT 11/29/14 that shows progression in ILD since march 2016 and now with UIP pattern (personally visualized image). She is finally accepting of fact that ILD is not makin cause of dyspnea and obesity is main problem. She is wondering about dc cellcept/pred/bactrim so as to minimize polypharmacy and side effect profile    OV 02/09/2015  Chief Complaint  Patient presents with  . Follow-up    Pt states her breathing is doing well the past few days. Pt recently went to [facility] ED for CP. Pt c/o prod cough with tan mucus. Pt denies CP/tightness    She has severe class 3 dyspnea with hypoxemia on account of BMI 50, diaast dsyfn, chronic pain, deconditioning and ILD due to RA. She is having progressive ILD. But given her miserable quality of life and polypharmacy and other side effects we decided to stop the CellCept and Bactrim. We also advise prednisone taper. This was all in the end of August 2016. But on 01/27/2015 she ended up in the emergency room with some respiratory difficulty. Her prednisone was bumped up. She subsequently saw her rheumatologist [name] was now tapering her prednisone again. There is also some concerns of headaches and possibly temporal arteritis based on her description and apparently some kind of a biopsy  scheduled. She feels that since stopping her CellCept and Bactrim her cough is returned. Nevertheless weight and knee pain and back pain continued to be major issues for her along with polypharmacy. Her Polysorb was includes MS Contin  [name]    OV 02/21/2015  - a ACUTE VISIT  Chief Complaint  Patient presents with  . Acute Visit    Pt c/o of chest tightness, productive cough with white/yellow mucus, and wheezing. Pt also c/o of feeling abdominal swelling/distention. Pt also c/o of constant fatigue. Pt states symptoms have been present x1 week.     Acute visit for this morbidly obese, rheumatoid arthritis patient with ILD who is now off CellCept  I saw her as recently as 02/09/2015. She reports that for the past 1 week she's having increased chest congestion, chest tightness, wheezing, shortness of breath, cough and yellow  sputum more than baseline. Symptoms are rated as moderate in severity. Relieved by inhaler. She has tried Mucinex for 1 week with some initial partial relief but symptoms progressed. So she made an acute visit.  She continues to stay off CellCept. She is not on prednisone 10 mg per day. She tells me [name] has held her prednisone at the dose because of concerns of temporal arteritis but apparently now that his diagnoses being eliminated in the differential diagnosis. She will see [name] in the next few weeks to decide on further prednisone taper.  OV 04/16/2015  Chief Complaint  Patient presents with  . Follow-up    Pt states she felt much better after completing the abx and pred. Pt states her SOB is at basline, prod cough with yellow mucus at times, pt c/o chest tightness.     Morbidly obese female with rheumatoid arthritis with interstitial lung disease UIP pattern with associated deconditioning and diastolic dysfunction  She continues to remain off CellCept. Last seen in October 2016. At that time I discharged her from the office  with doxycycline and prednisone taper. After that early November 2016 she was given another course of Doxy and prednisone burst. This did not help. Sputum culture at this time showed normal flora. She called again with the middle of November was given Levaquin and prednisone burst. She feels Levaquin helped her immensely. At this point in time she is reporting progressive dyspnea and also worsening arthritis. She wants to go back on immunomodulators again. She has discussed Imuran with [name]. She is due to see [name] tomorrow. She also feels that she is unable to come off prednisone. She feels at a minimum she needs 10 mg of prednisone per day. She will discuss this with Dr. D her rheumatologist tomorrow. She's also interested in a second opinion at [facility] interstitial lung disease clinic which I did discuss with her most recently. She continues to use oxygen.     OV 07/17/2015  Chief Complaint  Patient presents with  . Follow-up    Pt states that she has improved. Pt c/o continued SOB with exertion, some ocugh and wheeze, and some intermittent chest tightness. Pt states that she does sleep a lot. Pt states that her heart rate remains high and that cardiology said it was due to her lungs. Pt is currently on Lasix, 51m, but still seems to retain a lot of fluid and is not seeming to go to the bathroom as frequently.    Follow-up interstitial lung disease secondary to rheumatoid arthritis in the setting of morbid obesity, poor quality of life and heavy chronic pain opioid use and depressive symptoms  Last seen November 2016. Since then she has seen [name] rheumatology clinic. She was given instructions on Imuran which she has red but she has not decided whether she should take it or not. She subsequently has followed up with [facility] interstitial lung disease clinic. CT scan of the chest reviewed result shows possible UIP pattern 06/12/2015. She had autoimmune profile which was strongly positive for both  rheumatoid factor and cyclic citrulline peptide both markers for rheumatoid arthritis. She did see Dr. CMargreta JourneyB on 07/02/2015. It appears that the note is not complete but according to the patient was supposed to be a multidisciplinary conference and they will get back to her. It is possible a second immunomodulators agent and is being contemplated but patient is leery of this given prior issues with opportunistic infection not otherwise specified and admissions to the hospital. In the interim she is also seen cardiology Janr 2017 Dr. BGwenlyn Foundwho did not feel dyspnea was cardiac related. I reviewed his note Current outpatient prescriptions:  .  ACCU-CHEK SMARTVIEW test strip, 1 each by Other route every morning. , Disp: , Rfl: 1 .  acetaminophen (TYLENOL) 500 MG tablet, 2 by mouth twice daily for mild pain, 2 by mouth twice daily as needed for moderate painTakes with tramadol, Disp: , Rfl:  .  albuterol (PROVENTIL HFA;VENTOLIN HFA) 108 (90 BASE) MCG/ACT inhaler, Inhale 2 puffs into  the lungs every 6 (six) hours as needed for wheezing or shortness of breath., Disp: , Rfl:  .  allopurinol (ZYLOPRIM) 100 MG tablet, Take 2 tablets (200 mg total) by mouth daily., Disp: 60 tablet, Rfl: 4 .  aspirin EC 81 MG tablet, Take 81 mg by mouth every morning. , Disp: , Rfl:  .  atorvastatin (LIPITOR) 20 MG tablet, Take 10 mg by mouth every evening. , Disp: , Rfl:  .  Calcium Carb-Cholecalciferol (CALCIUM + D3 PO), Take 2 tablets by mouth daily with lunch. , Disp: , Rfl:  .  cholecalciferol (VITAMIN D) 1000 UNITS tablet, Take 2,000 Units by mouth daily with lunch. , Disp: , Rfl:  .  diclofenac sodium (VOLTAREN) 1 % GEL, Apply 1 application topically 3 (three) times daily., Disp: 3 Tube, Rfl: 4 .  DULoxetine (CYMBALTA) 60 MG capsule, Take 1 capsule (60 mg total) by mouth daily., Disp: 30 capsule, Rfl: 4 .  famotidine (PEPCID) 20 MG tablet, Take 1 tablet (20 mg total) by mouth at bedtime., Disp: 30 tablet, Rfl: 11 .   fluticasone (FLONASE) 50 MCG/ACT nasal spray, Place 2 sprays into both nostrils daily., Disp: , Rfl:  .  furosemide (LASIX) 40 MG tablet, Take 1-2 tablets (40-80 mg total) by mouth daily. Alternate doses, take 80 mg one day then 40 mg the next. (Patient taking differently: Take 80 mg by mouth daily. Alternate doses, take 80 mg one day then 40 mg the next.), Disp: 90 tablet, Rfl: 3 .  gabapentin (NEURONTIN) 600 MG tablet, Take 1 tablet (600 mg total) by mouth 3 (three) times daily. Take 2 tablet in the morning, 2 tablet in the middle of the day, and 3 tablets at night (Patient taking differently: Take 600 mg by mouth 3 (three) times daily. 1 tablet TID), Disp: 90 tablet, Rfl: 1 .  guaiFENesin (MUCINEX) 600 MG 12 hr tablet, Take 1,200 mg by mouth 2 (two) times daily., Disp: , Rfl:  .  levothyroxine (SYNTHROID, LEVOTHROID) 50 MCG tablet, Take 1 tablet (50 mcg total) by mouth daily., Disp: 30 tablet, Rfl: 1 .  metoprolol succinate (TOPROL-XL) 100 MG 24 hr tablet, Take 100 mg by mouth every morning. Take with or immediately following a meal., Disp: , Rfl:  .  morphine (MS CONTIN) 15 MG 12 hr tablet, Take MS Contin 15 mg  (12 hr tablet) one tablet every 8 hours for a week: Start 05/19/2015 On 05/26/2015 Take MS Contin 15 mg (12 hr tablet) One tablet every 12 hours ., Disp: 62 tablet, Rfl: 0 .  morphine (MSIR) 15 MG tablet, Take 1/2 to 1 tablet by mouth every 6 hours as needed for pain., Disp: 120 tablet, Rfl: 0 .  Multiple Vitamin (MULTIVITAMIN WITH MINERALS) TABS, Take 1 tablet by mouth daily with lunch. , Disp: , Rfl:  .  olmesartan-hydrochlorothiazide (BENICAR HCT) 40-25 MG tablet, Take by mouth., Disp: , Rfl:  .  OXYGEN-HELIUM IN, Inhale 2 L into the lungs at bedtime. And on exertion, Disp: , Rfl:  .  pantoprazole (PROTONIX) 40 MG tablet, TAKE 1 TABLET BY MOUTH DAILY, MINUTES BEFORE IST MEAL  OF THE DAY, Disp: 30 tablet, Rfl: 5 .  predniSONE (DELTASONE) 10 MG tablet, Take 10 mg by mouth every morning.,  Disp: , Rfl: 0 .  pregabalin (LYRICA) 50 MG capsule, Take 1 capsule (50 mg total) by mouth 3 (three) times daily., Disp: 90 capsule, Rfl: 3 .  sodium chloride (OCEAN) 0.65 % SOLN nasal spray, Place 1 spray  into both nostrils as needed for congestion., Disp: , Rfl:  .  Spacer/Aero-Holding Chambers (AEROCHAMBER MV) inhaler, Use as instructed, Disp: 1 each, Rfl: 0 .  VICTOZA 18 MG/3ML SOPN, Inject 1.8 mg as directed daily. , Disp: , Rfl: 0  Immunization History  Administered Date(s) Administered  . Influenza Split 01/29/2012, 03/30/2013, 02/10/2014  . Influenza,inj,Quad PF,36+ Mos 02/09/2015  . PPD Test 04/23/2015  . Pneumococcal Conjugate-13 08/21/2014  . Pneumococcal Polysaccharide-23 02/17/2012    Allergies  Allergen Reactions  . Codeine Swelling    Facial swelling  . Remicade [Infliximab] Anaphylaxis    "sent me into shock"  . Zestril [Lisinopril] Swelling    Face and neck swelling     Objective Filed Vitals:   07/17/15 1409  BP: 128/72  Pulse: 98  Height: 5' 4" (1.626 m)  Weight: 300 lb (136.079 kg)  SpO2: 93%   morbidly obese female sitting in the wheelchair. Flat affect. An into overall diminished. Bilateral bibasal crackles present. Chronic pedal edema present.   ASESSMENT/PLAN   ICD-9-CM ICD-10-CM   1. ILD (interstitial lung disease) ([place]) 515 J84.9   2. Chronic respiratory failure with hypoxia (HCC) 518.83 J96.11    799.02    3. High risk medication use V58.69 Z79.899   4. Dyspnea and respiratory abnormality 786.09 R06.00     R06.89    . Patient is constantly complaining about her poor quality of life, dyspnea and chronic pain. She is seeking a pathway forward but I have repeatedly told her that it is very difficult because more immunosuppression means more complications. She is not too keen on this. At the same time she has very severe disease of rheumatoid arthritis with interstitial lung disease complicated by chronic pain and morbid obesity. In my opinion the  best path forward for this lady would be home palliative care focusing on quality of life issues. I discussed this in detail with then she is open to this idea.    Please await recommendations from [facility] Till then continue prednisone and oxygen therapy as before You are in a very difficult position with her health - my best recommendation is to focus on her quality of life and symptoms  - Refer to home palliative care program  Follow-up - 3 months or sooner if needed    > 50% of this > 25 min visit spent in face to face counseling or coordination of care    Dr. Brand Males, M.D., [facility].C.P Pulmonary and Critical Care Medicine Staff Physician [facility] Pager: [phone], If no answer or between  15:00h - 7:00h: call [phone]  07/17/2015 2:42 PM

## 2015-07-20 DIAGNOSIS — R531 Weakness: Secondary | ICD-10-CM | POA: Diagnosis not present

## 2015-07-23 ENCOUNTER — Telehealth: Payer: Self-pay | Admitting: *Deleted

## 2015-07-23 ENCOUNTER — Telehealth: Payer: Self-pay | Admitting: Registered Nurse

## 2015-07-23 NOTE — Telephone Encounter (Addendum)
[name] called stating she was having increase drowsiness, she has been sleeping for long hours. She has been taking the Ms. Contin 30 mg one tablet every 12 hours.  She had her MS Contin 30 mg at 9:00 am, tonight at   ( 9:00 p.m.)she will take MS Contin 15 mg. Starting on 07/24/2015 she will take  MS Contin  15 mg every 8 hours for a week.  On March 13 th she will take MS Contin 15 mg every 12 hours. I spoke to [name] son [name] he will assist his mother with the medication changed. She is aware not to take the MS Contin 30 mg and to bring medication to the office to be destroyed. She verbalizes understanding.

## 2015-07-23 NOTE — Telephone Encounter (Signed)
Return [name] call, her husband states she was sleeping. He will have her to return the call when she wakes up.

## 2015-07-23 NOTE — Telephone Encounter (Signed)
Pt left a message asking for [name] to call her back. She states she is having medication problems, says her skin is jumping.

## 2015-07-31 DIAGNOSIS — J8410 Pulmonary fibrosis, unspecified: Secondary | ICD-10-CM | POA: Insufficient documentation

## 2015-07-31 DIAGNOSIS — R9431 Abnormal electrocardiogram [ECG] [EKG]: Secondary | ICD-10-CM | POA: Diagnosis not present

## 2015-07-31 DIAGNOSIS — J479 Bronchiectasis, uncomplicated: Secondary | ICD-10-CM | POA: Diagnosis not present

## 2015-07-31 DIAGNOSIS — R5381 Other malaise: Secondary | ICD-10-CM | POA: Diagnosis not present

## 2015-07-31 DIAGNOSIS — Z6841 Body Mass Index (BMI) 40.0 and over, adult: Secondary | ICD-10-CM | POA: Diagnosis not present

## 2015-07-31 DIAGNOSIS — M059 Rheumatoid arthritis with rheumatoid factor, unspecified: Secondary | ICD-10-CM | POA: Diagnosis not present

## 2015-08-07 DIAGNOSIS — Z79899 Other long term (current) drug therapy: Secondary | ICD-10-CM | POA: Diagnosis not present

## 2015-08-07 DIAGNOSIS — M2550 Pain in unspecified joint: Secondary | ICD-10-CM | POA: Diagnosis not present

## 2015-08-07 DIAGNOSIS — M25531 Pain in right wrist: Secondary | ICD-10-CM | POA: Diagnosis not present

## 2015-08-07 DIAGNOSIS — M0579 Rheumatoid arthritis with rheumatoid factor of multiple sites without organ or systems involvement: Secondary | ICD-10-CM | POA: Diagnosis not present

## 2015-08-07 DIAGNOSIS — Z09 Encounter for follow-up examination after completed treatment for conditions other than malignant neoplasm: Secondary | ICD-10-CM | POA: Diagnosis not present

## 2015-08-07 DIAGNOSIS — M170 Bilateral primary osteoarthritis of knee: Secondary | ICD-10-CM | POA: Diagnosis not present

## 2015-08-09 ENCOUNTER — Encounter: Payer: Medicare Other | Admitting: Registered Nurse

## 2015-08-09 ENCOUNTER — Encounter: Payer: Medicare Other | Attending: Physical Medicine & Rehabilitation | Admitting: Registered Nurse

## 2015-08-09 ENCOUNTER — Encounter: Payer: Self-pay | Admitting: Registered Nurse

## 2015-08-09 VITALS — BP 118/73 | HR 99

## 2015-08-09 DIAGNOSIS — M069 Rheumatoid arthritis, unspecified: Secondary | ICD-10-CM | POA: Insufficient documentation

## 2015-08-09 DIAGNOSIS — M1711 Unilateral primary osteoarthritis, right knee: Secondary | ICD-10-CM

## 2015-08-09 DIAGNOSIS — M1712 Unilateral primary osteoarthritis, left knee: Secondary | ICD-10-CM

## 2015-08-09 DIAGNOSIS — M5416 Radiculopathy, lumbar region: Secondary | ICD-10-CM | POA: Diagnosis not present

## 2015-08-09 DIAGNOSIS — Z79899 Other long term (current) drug therapy: Secondary | ICD-10-CM | POA: Insufficient documentation

## 2015-08-09 DIAGNOSIS — M7062 Trochanteric bursitis, left hip: Secondary | ICD-10-CM | POA: Insufficient documentation

## 2015-08-09 DIAGNOSIS — M4806 Spinal stenosis, lumbar region: Secondary | ICD-10-CM | POA: Diagnosis not present

## 2015-08-09 DIAGNOSIS — G894 Chronic pain syndrome: Secondary | ICD-10-CM | POA: Insufficient documentation

## 2015-08-09 DIAGNOSIS — Z5181 Encounter for therapeutic drug level monitoring: Secondary | ICD-10-CM | POA: Insufficient documentation

## 2015-08-09 DIAGNOSIS — M47816 Spondylosis without myelopathy or radiculopathy, lumbar region: Secondary | ICD-10-CM | POA: Diagnosis not present

## 2015-08-09 MED ORDER — MORPHINE SULFATE ER 15 MG PO TBCR: 15.0000 mg | EXTENDED_RELEASE_TABLET | Freq: Two times a day (BID) | ORAL | Status: DC

## 2015-08-09 NOTE — Progress Notes (Signed)
Subjective:    Patient ID: [name], female    DOB: Apr 17, 1941, 75 y.o.   MRN: [id number]  HPI: [name] is a 75 year old female who returns for follow up appointment and medication refill. She states her pain is located in her bilateral wrist L>R, lower back and bilateral knees.She rates her pain 6. Her current exercise regime is walking in her home with cane or walker, performing stretching exercises with bands. Also states her daughter is setting up her medications for her and her pain is controlled. She arrived in wheelchair.  Pain Inventory Average Pain 6 Pain Right Now 6 My pain is intermittent and sharp  In the last 24 hours, has pain interfered with the following? General activity 7 Relation with others 7 Enjoyment of life 7 What TIME of day is your pain at its worst? morning Sleep (in general) NA  Pain is worse with: walking, bending, standing and some activites Pain improves with: rest, heat/ice and medication Relief from Meds: 7  Mobility use a walker ability to climb steps?  no do you drive?  no use a wheelchair needs help with transfers  Function retired  Neuro/Psych bowel control problems numbness tingling trouble walking  Prior Studies Any changes since last visit?  no  Physicians involved in your care Any changes since last visit?  no   Family History  Problem Relation Age of Onset  . Diabetes Mother   . Heart attack Mother   . Hypertension Father   . Lung cancer Father   . Diabetes Sister   . Diabetes Brother   . Hypertension Brother   . Kidney cancer Brother   . Uterine cancer Daughter   . Breast cancer Sister   . Rheum arthritis Maternal Uncle   . Gout Brother   . Kidney failure Brother     x 5  . Heart disease Brother   . Heart attack Brother    Social History   Social History  . Marital Status: Married    Spouse Name: N/A  . Number of Children: 6  . Years of Education: college   Occupational  History  . retired Marine scientist    Social History Main Topics  . Smoking status: Never Smoker   . Smokeless tobacco: Never Used  . Alcohol Use: No  . Drug Use: No  . Sexual Activity: Not Currently   Other Topics Concern  . None   Social History Narrative   Patient consumes 2-3 cups coffee per day.   Past Surgical History  Procedure Laterality Date  . Abdominal hysterectomy    . Ovary surgery    . Shoulder surgery Left   . Brain surgery      Gamma knife 10/13. Needs repeat spring  '14  . Video bronchoscopy Bilateral 05/31/2013    Procedure: VIDEO BRONCHOSCOPY WITHOUT FLUORO;  Surgeon: Brand Males, MD;  Location: [facility];  Service: Cardiopulmonary;  Laterality: Bilateral;  . Video bronscoscopy  2000    lung  . Tonsillectomy  age 49  . Esophagogastroduodenoscopy (egd) with propofol N/A 09/14/2014    Procedure: ESOPHAGOGASTRODUODENOSCOPY (EGD) WITH PROPOFOL;  Surgeon: [name], MD;  Location: [facility];  Service: Endoscopy;  Laterality: N/A;  . Cardiac catheterization N/A 10/31/2014    Procedure: Right/Left Heart Cath and Coronary Angiography;  Surgeon: [name], MD;  Location: [facility];  Service: Cardiovascular;  Laterality: N/A;   Past Medical History  Diagnosis Date  . Diabetes mellitus   .  Hypertension   . Thyroid disease   . Arthritis     endstage changes bilateral knees/bilateral ankles.   . High cholesterol   . 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 (HCC) 02/17/2012    Continue diplopia, left eye pain and left headaches.    . Depression, reactive   . RA (rheumatoid arthritis) ([place])     has been off methotreaxte since 10/13.  . Contusion of left knee     due to fall 1/14.  [name] URI (upper respiratory infection)   . Clotting disorder ([place])     pt denies this  . Asthma   . Carotid artery occlusion   . Interstitial lung disease ([place])   . Morbid obesity ([place])   . Shortness of breath   . Family history of heart  disease   . Chronic kidney disease   . COPD (chronic obstructive pulmonary disease) (HCC)     pulmonary fibrosis  . Normal coronary arteries     cardiac catheterization performed  10/31/14  . Diastolic dysfunction     Opioid Risk Score:   Fall Risk Score:  `1  Depression screen PHQ 2/9  Depression screen [facility] 2/9 08/09/2015 01/05/2015 09/20/2014 08/10/2013  Decreased Interest _0 -  Down, Depressed, Hopeless _1 PHQ - 2 Score _2 Altered sleeping - 2 0 -  Tired, decreased energy - 2 3 -  Change in appetite - 2 3 -  Feeling bad or failure about yourself  - 0 3 -  Trouble concentrating - 1 1 -  Moving slowly or fidgety/restless - 0 2 -  Suicidal thoughts - 0 0 -  PHQ-9 Score - 10 16 -  Difficult doing work/chores - Somewhat difficult - -     Review of Systems  All other systems reviewed and are negative.      Objective:   Physical Exam  Constitutional: She is oriented to person, place, and time. She appears well-developed and well-nourished.  HENT:  Head: Normocephalic and atraumatic.  Neck: Normal range of motion. Neck supple.  Cervical Paraspinal Tenderness: C-5- C-6  Cardiovascular: Normal rate and regular rhythm.   Pulmonary/Chest: Effort normal and breath sounds normal.  Musculoskeletal: She exhibits edema.  Normal Muscle Bulk and Muscle Testing Reveals: Upper Extremities: Full ROM and Muscle Strength 5/5 Thoracic Paraspinal Tenderness: T-1- T-3 Lumbar Paraspinal Tenderness: L-3- L-5 Lower Extremities: Decreased ROM and Muscle Strength 4/5 Arrived in Wheelchair   Neurological: She is alert and oriented to person, place, and time.  Skin: Skin is warm and dry.  Psychiatric: She has a normal mood and affect.  Nursing note and vitals reviewed.         Assessment & Plan:  1. Lumbar spinal stenosis with neurogenic claudication. Associated facet arthropathy: Continue Gabapentin and HEP as tolerated. Refilled : MS Contin 15 mg one tablet every 12 hours  #60 and Continue MSIR 15 mg 1/2 to one tablet every 6 hours as needed. 2. Depression: Continue Cymbalta 3. Diabetes mellitus type 2 with polyneuropathy: Continue Gabapentin 4. Rheumatoid arthritis and osteoarthritis. Contnue Voltaren Gel 5. Interstitial lung disease: Continuous Oxygen and Pulmonology Following.    30 minutes of face to face patient care time was spent during this visit. All questions were encouraged and answered.  F/U in 1 month

## 2015-08-15 ENCOUNTER — Telehealth: Payer: Self-pay | Admitting: Internal Medicine

## 2015-08-15 LAB — 6-ACETYLMORPHINE,TOXASSURE ADD
6-ACETYLMORPHINE: NEGATIVE
6-acetylmorphine: NOT DETECTED ng/mg creat

## 2015-08-15 LAB — TOXASSURE SELECT,+ANTIDEPR,UR: PDF: 0

## 2015-08-15 NOTE — Telephone Encounter (Addendum)
Yes, this was faxed today 08/15/2015. This has been re-faxed to the requested number on form [phone]), confirmation received. Will sign off.

## 2015-08-15 NOTE — Telephone Encounter (Signed)
I have checked MR's look at and up front and this fax is not there. Daneil Dan do you have this form or should we request for it to be faxed again?

## 2015-08-16 NOTE — Progress Notes (Signed)
Urine drug screen for this encounter is consistent for prescribed medication 

## 2015-08-23 DIAGNOSIS — E1122 Type 2 diabetes mellitus with diabetic chronic kidney disease: Secondary | ICD-10-CM | POA: Diagnosis not present

## 2015-08-23 DIAGNOSIS — E039 Hypothyroidism, unspecified: Secondary | ICD-10-CM | POA: Diagnosis not present

## 2015-08-23 DIAGNOSIS — I130 Hypertensive heart and chronic kidney disease with heart failure and stage 1 through stage 4 chronic kidney disease, or unspecified chronic kidney disease: Secondary | ICD-10-CM | POA: Diagnosis not present

## 2015-08-23 DIAGNOSIS — N08 Glomerular disorders in diseases classified elsewhere: Secondary | ICD-10-CM | POA: Diagnosis not present

## 2015-08-23 DIAGNOSIS — Z79899 Other long term (current) drug therapy: Secondary | ICD-10-CM | POA: Diagnosis not present

## 2015-08-23 DIAGNOSIS — N183 Chronic kidney disease, stage 3 (moderate): Secondary | ICD-10-CM | POA: Diagnosis not present

## 2015-09-05 ENCOUNTER — Encounter: Payer: Medicare Other | Admitting: Physical Medicine & Rehabilitation

## 2015-09-12 ENCOUNTER — Other Ambulatory Visit: Payer: Self-pay | Admitting: *Deleted

## 2015-09-12 MED ORDER — PREGABALIN 50 MG PO CAPS: 50.0000 mg | ORAL_CAPSULE | Freq: Three times a day (TID) | ORAL | Status: DC

## 2015-09-18 DIAGNOSIS — J849 Interstitial pulmonary disease, unspecified: Secondary | ICD-10-CM | POA: Diagnosis not present

## 2015-09-18 DIAGNOSIS — R2681 Unsteadiness on feet: Secondary | ICD-10-CM | POA: Diagnosis not present

## 2015-09-18 DIAGNOSIS — Z6841 Body Mass Index (BMI) 40.0 and over, adult: Secondary | ICD-10-CM | POA: Diagnosis not present

## 2015-09-18 DIAGNOSIS — R531 Weakness: Secondary | ICD-10-CM | POA: Diagnosis not present

## 2015-09-18 DIAGNOSIS — Z7982 Long term (current) use of aspirin: Secondary | ICD-10-CM | POA: Diagnosis not present

## 2015-09-18 DIAGNOSIS — Z9981 Dependence on supplemental oxygen: Secondary | ICD-10-CM | POA: Diagnosis not present

## 2015-09-21 DIAGNOSIS — Z9981 Dependence on supplemental oxygen: Secondary | ICD-10-CM | POA: Diagnosis not present

## 2015-09-21 DIAGNOSIS — R2681 Unsteadiness on feet: Secondary | ICD-10-CM | POA: Diagnosis not present

## 2015-09-21 DIAGNOSIS — R531 Weakness: Secondary | ICD-10-CM | POA: Diagnosis not present

## 2015-09-21 DIAGNOSIS — Z6841 Body Mass Index (BMI) 40.0 and over, adult: Secondary | ICD-10-CM | POA: Diagnosis not present

## 2015-09-21 DIAGNOSIS — J849 Interstitial pulmonary disease, unspecified: Secondary | ICD-10-CM | POA: Diagnosis not present

## 2015-09-21 DIAGNOSIS — Z7982 Long term (current) use of aspirin: Secondary | ICD-10-CM | POA: Diagnosis not present

## 2015-09-24 ENCOUNTER — Encounter: Payer: Medicare Other | Attending: Physical Medicine & Rehabilitation | Admitting: Physical Medicine & Rehabilitation

## 2015-09-24 ENCOUNTER — Encounter: Payer: Self-pay | Admitting: Physical Medicine & Rehabilitation

## 2015-09-24 VITALS — BP 94/55 | HR 97 | Resp 16

## 2015-09-24 DIAGNOSIS — M47816 Spondylosis without myelopathy or radiculopathy, lumbar region: Secondary | ICD-10-CM | POA: Diagnosis not present

## 2015-09-24 DIAGNOSIS — M7062 Trochanteric bursitis, left hip: Secondary | ICD-10-CM | POA: Diagnosis not present

## 2015-09-24 DIAGNOSIS — Z79899 Other long term (current) drug therapy: Secondary | ICD-10-CM | POA: Diagnosis not present

## 2015-09-24 DIAGNOSIS — Z5181 Encounter for therapeutic drug level monitoring: Secondary | ICD-10-CM | POA: Diagnosis not present

## 2015-09-24 DIAGNOSIS — M069 Rheumatoid arthritis, unspecified: Secondary | ICD-10-CM | POA: Diagnosis not present

## 2015-09-24 DIAGNOSIS — E1140 Type 2 diabetes mellitus with diabetic neuropathy, unspecified: Secondary | ICD-10-CM

## 2015-09-24 DIAGNOSIS — M4806 Spinal stenosis, lumbar region: Secondary | ICD-10-CM | POA: Insufficient documentation

## 2015-09-24 DIAGNOSIS — G894 Chronic pain syndrome: Secondary | ICD-10-CM | POA: Diagnosis not present

## 2015-09-24 DIAGNOSIS — M5416 Radiculopathy, lumbar region: Secondary | ICD-10-CM | POA: Diagnosis not present

## 2015-09-24 DIAGNOSIS — M1712 Unilateral primary osteoarthritis, left knee: Secondary | ICD-10-CM

## 2015-09-24 DIAGNOSIS — M1711 Unilateral primary osteoarthritis, right knee: Secondary | ICD-10-CM

## 2015-09-24 MED ORDER — PREGABALIN 100 MG PO CAPS: 100.0000 mg | ORAL_CAPSULE | Freq: Three times a day (TID) | ORAL | Status: DC

## 2015-09-24 MED ORDER — MORPHINE SULFATE ER 15 MG PO TBCR: 15.0000 mg | EXTENDED_RELEASE_TABLET | Freq: Two times a day (BID) | ORAL | Status: DC

## 2015-09-24 MED ORDER — MORPHINE SULFATE 15 MG PO TABS: ORAL_TABLET | ORAL | Status: DC

## 2015-09-24 NOTE — Patient Instructions (Addendum)
PLEASE CALL ME WITH ANY PROBLEMS OR QUESTIONS (#[phone]).     DECREASE THE GABAPENTIN TO 300MG THREE X DAILY FOR 1 WEEK THEN 300MG TWO X DAILY FOR ONE WEEK THEN STOP.

## 2015-09-24 NOTE — Progress Notes (Signed)
Subjective:    Patient ID: [name], female    DOB: 11-Nov-1940, 75 y.o.   MRN: [id number]  HPI  [name] is back regarding her chronic pain. I last saw her in January. The left shoulder injeciton proved quite helpful whereas the left knee injection did not provide long term relief.  The change to lyrica seemed to really help her neuropathic pain. She hasn't come off the gabapentin all the way and is still taking 697m TID.   She continues to struggle form a pulmonary standpoint and is limited with her exercise by her lungs as well as her multiple arthridities. [facility] She is only on prednisone currently.   She remains on ms contin and ms IR per our clinic. These provide some relief in her pain.     Pain Inventory Average Pain 7 Pain Right Now 5 My pain is intermittent, sharp, burning, tingling and aching  In the last 24 hours, has pain interfered with the following? General activity 7 Relation with others 7 Enjoyment of life 7 What TIME of day is your pain at its worst? morning Sleep (in general) Fair  Pain is worse with: walking, bending, standing and unsure Pain improves with: rest, heat/ice, pacing activities and medication Relief from Meds: 8  Mobility walk with assistance use a cane use a walker ability to climb steps?  no do you drive?  no use a wheelchair  Function retired I need assistance with the following:  meal prep, household duties and shopping Do you have any goals in this area?  no  Neuro/Psych bladder control problems weakness  Prior Studies Any changes since last visit?  no  Physicians involved in your care Any changes since last visit?  no   Family History  Problem Relation Age of Onset  . Diabetes Mother   . Heart attack Mother   . Hypertension Father   . Lung cancer Father   . Diabetes Sister   . Diabetes Brother   . Hypertension Brother   .  Kidney cancer Brother   . Uterine cancer Daughter   . Breast cancer Sister   . Rheum arthritis Maternal Uncle   . Gout Brother   . Kidney failure Brother     x 5  . Heart disease Brother   . Heart attack Brother    Social History   Social History  . Marital Status: Married    Spouse Name: N/A  . Number of Children: 6  . Years of Education: college   Occupational History  . 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 Smoking status: Never Smoker   . Smokeless tobacco: Never Used  . Alcohol Use: No  . Drug Use: No  . Sexual Activity: Not Currently   Other Topics Concern  . None   Social History Narrative   Patient consumes 2-3 cups coffee per day.   Past Surgical History  Procedure Laterality Date  . Abdominal hysterectomy    . Ovary surgery    . Shoulder surgery Left   . Brain surgery      Gamma knife 10/13. Needs repeat spring  '14  . Video bronchoscopy Bilateral 05/31/2013    Procedure: VIDEO BRONCHOSCOPY WITHOUT FLUORO;  Surgeon: MBrand Males MD;  [place] [facility]  Service: Cardiopulmonary;  Laterality: Bilateral;  . Video bronscoscopy  2000    lung  . Tonsillectomy  age [age] . Esophagogastroduodenoscopy (egd)  with propofol N/A 09/14/2014    Procedure: ESOPHAGOGASTRODUODENOSCOPY (EGD) WITH PROPOFOL;  Surgeon: [name], MD;  Location[facility];  Service: Endoscopy;  Laterality: N/A;  . Cardiac catheterization N/A 10/31/2014    Procedure: Right/Left Heart Cath and Coronary Angiography;  Surgeon: [name], MD;  Location: [facility];  Service: Cardiovascular;  Laterality: N/A;   Past Medical History  Diagnosis Date  . Diabetes mellitus   . Hypertension   . Thyroid disease   . Arthritis     endstage changes bilateral knees/bilateral ankles.   . High cholesterol   . 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 (HCC) 02/17/2012    Continue diplopia, left eye pain and left headaches.    . Depression, reactive    . RA (rheumatoid arthritis) ([place])     has been off methotreaxte since 10/13.  . Contusion of left knee     due to fall 1/14.  [name] URI (upper respiratory infection)   . Clotting disorder ([place])     pt denies this  . Asthma   . Carotid artery occlusion   . Interstitial lung disease ([place])   . Morbid obesity ([place])   . Shortness of breath   . Family history of heart disease   . Chronic kidney disease   . COPD (chronic obstructive pulmonary disease) (HCC)     pulmonary fibrosis  . Normal coronary arteries     cardiac catheterization performed  10/31/14  . Diastolic dysfunction    BP 94/55 mmHg  Pulse 97  Resp 16  SpO2 94%  Opioid Risk Score:   Fall Risk Score:  `1  Depression screen PHQ 2/9  Depression screen [facility] 2/9 08/09/2015 01/05/2015 09/20/2014 08/10/2013  Decreased Interest _0 -  Down, Depressed, Hopeless _1 PHQ - 2 Score _2 Altered sleeping - 2 0 -  Tired, decreased energy - 2 3 -  Change in appetite - 2 3 -  Feeling bad or failure about yourself  - 0 3 -  Trouble concentrating - 1 1 -  Moving slowly or fidgety/restless - 0 2 -  Suicidal thoughts - 0 0 -  PHQ-9 Score - 10 16 -  Difficult doing work/chores - Somewhat difficult - -     Review of Systems  Constitutional: Positive for unexpected weight change.  Respiratory: Positive for shortness of breath.   Cardiovascular: Positive for leg swelling.  Gastrointestinal: Positive for constipation.  All other systems reviewed and are negative.      Objective:   Physical Exam  General: Alert and oriented x 3, No apparent distress. obese  HEENT: Head is normocephalic, atraumatic, PERRLA, EOMI, sclera anicteric, oral mucosa pink and moist, dentition intact, ext ear canals clear,  Neck: Supple without JVD or lymphadenopathy  Heart: Reg rate and rhythm. No murmurs rubs or gallops  Chest: sob with gait, sometimes at rest. Phonates well  Abdomen: Soft, non-tender, non-distended, bowel sounds positive.    Extremities: No clubbing, cyanosis, 2+ edema still in bilateral legs, negative homan's, no warmth or erythema. Pulses intact.. Measured calf diameter and bother were the same 49.5 cm. Pulses are 2+. Legs are warm,normal temperature  Skin: Clean and intact without signs of breakdown  Neuro: Pt is cognitively appropriate with normal insight, memory, and awareness. Cranial nerves 2-12 are intact. Sensory exam decreased to LT in both legs below the knees.... Reflexes are 2+ in all 4's. Fine motor coordination is intact.  No tremors. Motor function is grossly 4 to 5/5 with pain inhbition. Musculoskeletal:  She is slightly elevated in the left pelvis. She has difficulty standing today, comes to office in w/c in fact. She is tender to touch at both knees in medial and lateral compartments.--- Shins are sensitive as well. Crepitus in both knees with AROM . Fingers lack flexion especially at DIP's. No catches or nodules palpated along volar aspects of fingers.  Psych: Pt's affect is appropriate. Pt is cooperative  Assessment & Plan:   Assessment:  1. Lumbar spinal stenosis with neurogenic claudication. Associated facet arthropathy  2. Depression.  3. Diabetes mellitus type 2 with polyneuropathy  4. Rheumatoid arthritis and osteoarthritis. Multiple joint involvement including knees  5. Left and right greater troch bursitis  6. Interstitial lung disease  7. Multifactorial fatigue---Likely related to her volume/cbg's     Plan:  1. After informed consent and preparation of the skin with betadine and isopropyl alcohol, I injected 26m (1cc) of celestone and 4cc of 1% lidocaine into the left and right knees each via lateral-anterior approach. Additionally, aspiration was performed prior to injection. The patient tolerated well; however needle placement was difficult today. Afterward the area was cleaned and dressed. Post- injection instructions were provided.   2. . Voltaren gel for fingers/knees. Physical  activity to tolerance 3. Narcotics:.  -continue MS CR [id number] #90  - MS IR for breakthrough pain, 1524m1/2 to 1 q12 prn, #60  4 . Lyrica trial---increase to 100m11mD.  Wean gabapentin off. Instructions were given.   5. Continue cymbalta 60mg28mly. .  6. Follow up with me or NP in 1 month. 30 minutes of face to face patient care time were spent during this visit. All questions were encouraged and answered.

## 2015-09-25 DIAGNOSIS — Z9981 Dependence on supplemental oxygen: Secondary | ICD-10-CM | POA: Diagnosis not present

## 2015-09-25 DIAGNOSIS — Z6841 Body Mass Index (BMI) 40.0 and over, adult: Secondary | ICD-10-CM | POA: Diagnosis not present

## 2015-09-25 DIAGNOSIS — J849 Interstitial pulmonary disease, unspecified: Secondary | ICD-10-CM | POA: Diagnosis not present

## 2015-09-25 DIAGNOSIS — R531 Weakness: Secondary | ICD-10-CM | POA: Diagnosis not present

## 2015-09-25 DIAGNOSIS — Z7982 Long term (current) use of aspirin: Secondary | ICD-10-CM | POA: Diagnosis not present

## 2015-09-25 DIAGNOSIS — R2681 Unsteadiness on feet: Secondary | ICD-10-CM | POA: Diagnosis not present

## 2015-09-27 DIAGNOSIS — J849 Interstitial pulmonary disease, unspecified: Secondary | ICD-10-CM | POA: Diagnosis not present

## 2015-09-27 DIAGNOSIS — Z9981 Dependence on supplemental oxygen: Secondary | ICD-10-CM | POA: Diagnosis not present

## 2015-09-27 DIAGNOSIS — Z6841 Body Mass Index (BMI) 40.0 and over, adult: Secondary | ICD-10-CM | POA: Diagnosis not present

## 2015-09-27 DIAGNOSIS — R531 Weakness: Secondary | ICD-10-CM | POA: Diagnosis not present

## 2015-09-27 DIAGNOSIS — Z7982 Long term (current) use of aspirin: Secondary | ICD-10-CM | POA: Diagnosis not present

## 2015-09-27 DIAGNOSIS — R2681 Unsteadiness on feet: Secondary | ICD-10-CM | POA: Diagnosis not present

## 2015-10-02 DIAGNOSIS — R531 Weakness: Secondary | ICD-10-CM | POA: Diagnosis not present

## 2015-10-02 DIAGNOSIS — R2681 Unsteadiness on feet: Secondary | ICD-10-CM | POA: Diagnosis not present

## 2015-10-02 DIAGNOSIS — Z6841 Body Mass Index (BMI) 40.0 and over, adult: Secondary | ICD-10-CM | POA: Diagnosis not present

## 2015-10-02 DIAGNOSIS — Z9981 Dependence on supplemental oxygen: Secondary | ICD-10-CM | POA: Diagnosis not present

## 2015-10-02 DIAGNOSIS — J849 Interstitial pulmonary disease, unspecified: Secondary | ICD-10-CM | POA: Diagnosis not present

## 2015-10-02 DIAGNOSIS — Z7982 Long term (current) use of aspirin: Secondary | ICD-10-CM | POA: Diagnosis not present

## 2015-10-04 DIAGNOSIS — R531 Weakness: Secondary | ICD-10-CM | POA: Diagnosis not present

## 2015-10-04 DIAGNOSIS — Z7982 Long term (current) use of aspirin: Secondary | ICD-10-CM | POA: Diagnosis not present

## 2015-10-04 DIAGNOSIS — Z9981 Dependence on supplemental oxygen: Secondary | ICD-10-CM | POA: Diagnosis not present

## 2015-10-04 DIAGNOSIS — J849 Interstitial pulmonary disease, unspecified: Secondary | ICD-10-CM | POA: Diagnosis not present

## 2015-10-04 DIAGNOSIS — Z6841 Body Mass Index (BMI) 40.0 and over, adult: Secondary | ICD-10-CM | POA: Diagnosis not present

## 2015-10-04 DIAGNOSIS — R2681 Unsteadiness on feet: Secondary | ICD-10-CM | POA: Diagnosis not present

## 2015-10-16 DIAGNOSIS — Z6841 Body Mass Index (BMI) 40.0 and over, adult: Secondary | ICD-10-CM | POA: Diagnosis not present

## 2015-10-16 DIAGNOSIS — J849 Interstitial pulmonary disease, unspecified: Secondary | ICD-10-CM | POA: Diagnosis not present

## 2015-10-16 DIAGNOSIS — R531 Weakness: Secondary | ICD-10-CM | POA: Diagnosis not present

## 2015-10-16 DIAGNOSIS — Z9981 Dependence on supplemental oxygen: Secondary | ICD-10-CM | POA: Diagnosis not present

## 2015-10-16 DIAGNOSIS — Z7982 Long term (current) use of aspirin: Secondary | ICD-10-CM | POA: Diagnosis not present

## 2015-10-16 DIAGNOSIS — R2681 Unsteadiness on feet: Secondary | ICD-10-CM | POA: Diagnosis not present

## 2015-10-17 ENCOUNTER — Encounter: Payer: Self-pay | Admitting: Internal Medicine

## 2015-10-17 ENCOUNTER — Ambulatory Visit (INDEPENDENT_AMBULATORY_CARE_PROVIDER_SITE_OTHER): Payer: Medicare Other | Admitting: Internal Medicine

## 2015-10-17 VITALS — BP 108/78 | HR 99 | Ht 64.0 in | Wt 295.0 lb

## 2015-10-17 DIAGNOSIS — J479 Bronchiectasis, uncomplicated: Secondary | ICD-10-CM | POA: Diagnosis not present

## 2015-10-17 DIAGNOSIS — J849 Interstitial pulmonary disease, unspecified: Secondary | ICD-10-CM | POA: Diagnosis not present

## 2015-10-17 NOTE — Patient Instructions (Signed)
ICD-9-CM ICD-10-CM   1. ILD (interstitial lung disease) ([place]) 515 J84.9   2. Bronchiectasis without complication (HCC) 449.2 J47.9   3. Morbid obesity, unspecified obesity type ([place]) 278.01 E66.01     Glad you are better with home PT and daily azithromycin from [facility] Re-refer to pulmonary rehab at [place] Refer to [facility] for weight loss Immunosuppressants through rheumatology - noted you are going to [facility] for 2nd opinion  followup 4 months or sooner if needed

## 2015-10-17 NOTE — Progress Notes (Signed)
Subjective:     Patient ID: [name], female   DOB: [birth year], 75 y.o.   MRN: [id number]  HPI    OV 01/03/2015  Chief Complaint  Patient presents with  . Follow-up    Pt c/o DOE, resolving cough that started over the weekend, chest discomfort when SOB and when lying down. Pt c/o increasing fatigue.    She has severe class 3 dyspnea with hypoxemia on account of BMI 50, diaast dsyfn, chronic pain, deconditioning and ILD due to RA on celcept/pred/bactrim   I personally last saw her in march 2-016. Since then has seen other provicers in this office and most recently in June 2016. In June 2016 admitted for acute CHF - cath showed mild pulm htn only with PA mean 24 and minimal CAD (reviewed chart of procedures June 2016). Dx as acute diast chf. Since then better. SHe had fu HRCT 11/29/14 that shows progression in ILD since march 2016 and now with UIP pattern (personally visualized image). She is finally accepting of fact that ILD is not makin cause of dyspnea and obesity is main problem. She is wondering about dc cellcept/pred/bactrim so as to minimize polypharmacy and side effect profile    OV 02/09/2015  Chief Complaint  Patient presents with  . Follow-up    Pt states her breathing is doing well the past few days. Pt recently went to [facility] ED for CP. Pt c/o prod cough with tan mucus. Pt denies CP/tightness    She has severe class 3 dyspnea with hypoxemia on account of BMI 50, diaast dsyfn, chronic pain, deconditioning and ILD due to RA. She is having progressive ILD. But given her miserable quality of life and polypharmacy and other side effects we decided to stop the CellCept and Bactrim. We also advise prednisone taper. This was all in the end of August 2016. But on 01/27/2015 she ended up in the emergency room with some respiratory difficulty. Her prednisone was bumped up. She subsequently saw her rheumatologist [name] was now tapering her prednisone again. There is also some concerns  of headaches and possibly temporal arteritis based on her description and apparently some kind of a biopsy scheduled. She feels that since stopping her CellCept and Bactrim her cough is returned. Nevertheless weight and knee pain and back pain continued to be major issues for her along with polypharmacy. Her Polysorb was includes MS Contin  [name]    OV 02/21/2015  - a ACUTE VISIT  Chief Complaint  Patient presents with  . Acute Visit    Pt c/o of chest tightness, productive cough with white/yellow mucus, and wheezing. Pt also c/o of feeling abdominal swelling/distention. Pt also c/o of constant fatigue. Pt states symptoms have been present x1 week.     Acute visit for this morbidly obese, rheumatoid arthritis patient with ILD who is now off CellCept  I saw her as recently as 02/09/2015. She reports that for the past 1 week she's having increased chest congestion, chest tightness, wheezing, shortness of breath, cough and yellow  sputum more than baseline. Symptoms are rated as moderate in severity. Relieved by inhaler. She has tried Mucinex for 1 week with some initial partial relief but symptoms progressed. So she made an acute visit.  She continues to stay off CellCept. She is not on prednisone 10 mg per day. She tells me [name] has held her prednisone at the dose because of concerns of temporal arteritis but apparently now  that his diagnoses being eliminated in the differential diagnosis. She will see [name] in the next few weeks to decide on further prednisone taper.    OV 04/16/2015  Chief Complaint  Patient presents with  . Follow-up    Pt states she felt much better after completing the abx and pred. Pt states her SOB is at basline, prod cough with yellow mucus at times, pt c/o chest tightness.     Morbidly obese female with rheumatoid arthritis with interstitial lung disease UIP pattern with associated deconditioning and diastolic dysfunction  She  continues to remain off CellCept. Last seen in October 2016. At that time I discharged her from the office with doxycycline and prednisone taper. After that early November 2016 she was given another course of Doxy and prednisone burst. This did not help. Sputum culture at this time showed normal flora. She called again with the middle of November was given Levaquin and prednisone burst. She feels Levaquin helped her immensely. At this point in time she is reporting progressive dyspnea and also worsening arthritis. She wants to go back on immunomodulators again. She has discussed Imuran with [name]. She is due to see [name] tomorrow. She also feels that she is unable to come off prednisone. She feels at a minimum she needs 10 mg of prednisone per day. She will discuss this with Dr. D her rheumatologist tomorrow. She's also interested in a second opinion at [facility] interstitial lung disease clinic which I did discuss with her most recently. She continues to use oxygen.     OV 07/17/2015  Chief Complaint  Patient presents with  . Follow-up    Pt states that she has improved. Pt c/o continued SOB with exertion, some ocugh and wheeze, and some intermittent chest tightness. Pt states that she does sleep a lot. Pt states that her heart rate remains high and that cardiology said it was due to her lungs. Pt is currently on Lasix, 97m, but still seems to retain a lot of fluid and is not seeming to go to the bathroom as frequently.    Follow-up interstitial lung disease secondary to rheumatoid arthritis in the setting of morbid obesity, poor quality of life and heavy chronic pain opioid use and depressive symptoms  Last seen November 2016. Since then she has seen [name] rheumatology clinic. She was given instructions on Imuran which she has red but she has not decided whether she should take it or not. She subsequently has followed up with [facility] interstitial lung disease clinic. CT scan of the chest reviewed result  shows possible UIP pattern 06/12/2015. She had autoimmune profile which was strongly positive for both rheumatoid factor and cyclic citrulline peptide both markers for rheumatoid arthritis. She did see Dr. CMargreta JourneyB on 07/02/2015. It appears that the note is not complete but according to the patient was supposed to be a multidisciplinary conference and they will get back to her. It is possible a second immunomodulators agent and is being contemplated but patient is leery of this given prior issues with opportunistic infection not otherwise specified and admissions to the hospital. In the interim she is also seen cardiology Janr 2017 Dr. BGwenlyn Foundwho did not feel dyspnea was cardiac related. I reviewed his note  OV 10/17/2015  Chief Complaint  Patient presents with  . Follow-up    Pt reports she feels improved. Pt c/o continued SOB with exertion, some wheeze and occasional cough. Pt denies CP/tightness.     Follow-up  interstitial lung disease secondary to rheumatoid arthritis in the setting of morbid obesity, poor quality of life and heavy chronic pain opioid use and depressive symptoms   Since seeing me last in February 2017. She followed up at [facility] pulmonary clinic in March 2017. I reviewed those notes. She saw Dr. Margreta Journey B on 07/31/2015. Due to the presence of bronchiectasis she's been started on  azithromycin 3 times a week. She says this has helped. In addition due to physical deconditioning and obesity she is getting home physical therapy is also help. She is interested in pulmonary rehabilitation although given obesity and back pain can be challenging. She nevertheless wants to try. She is on daily prednisone 10 mg a day. She met with [name] in the rheumatology clinic and has been referred now to [facility] rheumatology clinic for second opinion for a second immunomodulators. She continues to be morbidly obese and has been losing weight. She is somewhat interested in visiting with a  [facility] for weight loss.      has a past medical history of Diabetes mellitus; Hypertension; Thyroid disease; Arthritis; High cholesterol; 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 ([place]) (02/17/2012); Depression, reactive; RA (rheumatoid arthritis) ([place]); Contusion of left knee; URI (upper respiratory infection); Clotting disorder ([place]); Asthma; Carotid artery occlusion; Interstitial lung disease ([place]); Morbid obesity ([place]); Shortness of breath; Family history of heart disease; Chronic kidney disease; COPD (chronic obstructive pulmonary disease) ([place]); Normal coronary arteries; and Diastolic dysfunction.   reports that she has never smoked. She has never used smokeless tobacco.  Past Surgical History  Procedure Laterality Date  . Abdominal hysterectomy    . Ovary surgery    . Shoulder surgery Left   . Brain surgery      Gamma knife 10/13. Needs repeat spring  '14  . Video bronchoscopy Bilateral 05/31/2013    Procedure: VIDEO BRONCHOSCOPY WITHOUT FLUORO;  Surgeon: Brand Males, MD;  Location: [place];  Service: Cardiopulmonary;  Laterality: Bilateral;  . Video bronscoscopy  2000    lung  . Tonsillectomy  age 36  . Esophagogastroduodenoscopy (egd) with propofol N/A 09/14/2014    Procedure: ESOPHAGOGASTRODUODENOSCOPY (EGD) WITH PROPOFOL;  Surgeon: [name], MD;  Location: [facility];  Service: Endoscopy;  Laterality: N/A;  . Cardiac catheterization N/A 10/31/2014    Procedure: Right/Left Heart Cath and Coronary Angiography;  Surgeon: [name], MD;  Location: [place] [facility];  Service: Cardiovascular;  Laterality: N/A;    Allergies  Allergen Reactions  . Codeine Swelling    Facial swelling  . Remicade [Infliximab] Anaphylaxis    "sent me into shock"  . Zestril [Lisinopril] Swelling    Face and neck swelling    Immunization History  Administered Date(s) Administered  . Influenza Split 01/29/2012, 03/30/2013, 02/10/2014  .  Influenza,inj,Quad PF,36+ Mos 02/09/2015  . PPD Test 04/23/2015  . Pneumococcal Conjugate-13 08/21/2014  . Pneumococcal Polysaccharide-23 02/17/2012    Family History  Problem Relation Age of Onset  . Diabetes Mother   . Heart attack Mother   . Hypertension Father   . Lung cancer Father   . Diabetes Sister   . Diabetes Brother   . Hypertension Brother   . Kidney cancer Brother   . Uterine cancer Daughter   . Breast cancer Sister   . Rheum arthritis Maternal Uncle   . Gout Brother   . Kidney failure Brother     x 5  . Heart disease Brother   . Heart  attack Brother      Current outpatient prescriptions:  .  ACCU-CHEK SMARTVIEW test strip, 1 each by Other route every morning. , Disp: , Rfl: 1 .  acetaminophen (TYLENOL) 500 MG tablet, 2 by mouth twice daily for mild pain, 2 by mouth twice daily as needed for moderate painTakes with tramadol, Disp: , Rfl:  .  albuterol (PROVENTIL HFA;VENTOLIN HFA) 108 (90 BASE) MCG/ACT inhaler, Inhale 2 puffs into the lungs every 6 (six) hours as needed for wheezing or shortness of breath., Disp: , Rfl:  .  allopurinol (ZYLOPRIM) 100 MG tablet, Take 2 tablets (200 mg total) by mouth daily., Disp: 60 tablet, Rfl: 4 .  aspirin EC 81 MG tablet, Take 81 mg by mouth every morning. , Disp: , Rfl:  .  atorvastatin (LIPITOR) 20 MG tablet, Take 10 mg by mouth every evening. , Disp: , Rfl:  .  azithromycin (ZITHROMAX) 500 MG tablet, Take 1 tablet by mouth daily. TAKE ON MON, WED & FRI, Disp: , Rfl: 0 .  Calcium Carb-Cholecalciferol (CALCIUM + D3 PO), Take 2 tablets by mouth daily with lunch. , Disp: , Rfl:  .  cholecalciferol (VITAMIN D) 1000 UNITS tablet, Take 2,000 Units by mouth daily with lunch. , Disp: , Rfl:  .  diclofenac sodium (VOLTAREN) 1 % GEL, Apply 1 application topically 3 (three) times daily., Disp: 3 Tube, Rfl: 4 .  DULoxetine (CYMBALTA) 60 MG capsule, Take 1 capsule (60 mg total) by mouth daily., Disp: 30 capsule, Rfl: 4 .  famotidine  (PEPCID) 20 MG tablet, Take 1 tablet (20 mg total) by mouth at bedtime., Disp: 30 tablet, Rfl: 11 .  fluticasone (FLONASE) 50 MCG/ACT nasal spray, Place 2 sprays into both nostrils daily., Disp: , Rfl:  .  furosemide (LASIX) 40 MG tablet, Take 1-2 tablets (40-80 mg total) by mouth daily. Alternate doses, take 80 mg one day then 40 mg the next. (Patient taking differently: Take 80 mg by mouth daily. Alternate doses, take 80 mg one day then 40 mg the next.), Disp: 90 tablet, Rfl: 3 .  guaiFENesin (MUCINEX) 600 MG 12 hr tablet, Take 1,200 mg by mouth 2 (two) times daily., Disp: , Rfl:  .  levothyroxine (SYNTHROID, LEVOTHROID) 50 MCG tablet, Take 1 tablet (50 mcg total) by mouth daily., Disp: 30 tablet, Rfl: 1 .  metoprolol succinate (TOPROL-XL) 100 MG 24 hr tablet, Take 100 mg by mouth every morning. Take with or immediately following a meal., Disp: , Rfl:  .  morphine (MS CONTIN) 15 MG 12 hr tablet, Take 1 tablet (15 mg total) by mouth every 12 (twelve) hours., Disp: 60 tablet, Rfl: 0 .  morphine (MSIR) 15 MG tablet, Take 1/2 to 1 tablet by mouth every 6 hours as needed for pain., Disp: 120 tablet, Rfl: 0 .  Multiple Vitamin (MULTIVITAMIN WITH MINERALS) TABS, Take 1 tablet by mouth daily with lunch. , Disp: , Rfl:  .  olmesartan-hydrochlorothiazide (BENICAR HCT) 40-25 MG tablet, Take by mouth., Disp: , Rfl:  .  OXYGEN-HELIUM IN, Inhale 2 L into the lungs at bedtime. And on exertion, Disp: , Rfl:  .  predniSONE (DELTASONE) 10 MG tablet, Take 10 mg by mouth every morning., Disp: , Rfl: 0 .  pregabalin (LYRICA) 100 MG capsule, Take 1 capsule (100 mg total) by mouth 3 (three) times daily., Disp: 90 capsule, Rfl: 3 .  sodium chloride (OCEAN) 0.65 % SOLN nasal spray, Place 1 spray into both nostrils as needed for congestion., Disp: , Rfl:  .  Spacer/Aero-Holding Chambers (AEROCHAMBER MV) inhaler, Use as instructed, Disp: 1 each, Rfl: 0 .  VICTOZA 18 MG/3ML SOPN, Inject 1.8 mg as directed daily. , Disp: , Rfl:  0 .  pantoprazole (PROTONIX) 40 MG tablet, TAKE 1 TABLET BY MOUTH DAILY, MINUTES BEFORE IST MEAL  OF THE DAY, Disp: 30 tablet, Rfl: 5     Review of Systems     Objective:   Physical Exam  Constitutional: She is oriented to person, place, and time. She appears well-developed and well-nourished. No distress.  Morbidly obese  HENT:  Head: Normocephalic and atraumatic.  Right Ear: External ear normal.  Left Ear: External ear normal.  Mouth/Throat: Oropharynx is clear and moist. No oropharyngeal exudate.  Eyes: Conjunctivae and EOM are normal. Pupils are equal, round, and reactive to light. Right eye exhibits no discharge. Left eye exhibits no discharge. No scleral icterus.  Neck: Normal range of motion. Neck supple. No JVD present. No tracheal deviation present. No thyromegaly present.  Cardiovascular: Normal rate, regular rhythm, normal heart sounds and intact distal pulses.  Exam reveals no Mcdaniel and no friction rub.   No murmur heard. Pulmonary/Chest: Effort normal and breath sounds normal. No respiratory distress. She has no wheezes. She has no rales. She exhibits no tenderness.  Basal crackles +  Abdominal: Soft. Bowel sounds are normal. She exhibits no distension and no mass. There is no tenderness. There is no rebound and no guarding.  Musculoskeletal: Normal range of motion. She exhibits no edema or tenderness.  Has a walker. Looks a little bit stronger  Lymphadenopathy:    She has no cervical adenopathy.  Neurological: She is alert and oriented to person, place, and time. She has normal reflexes. No cranial nerve deficit. She exhibits normal muscle tone. Coordination normal.  Skin: Skin is warm and dry. No rash noted. She is not diaphoretic. No erythema. No pallor.  Psychiatric: She has a normal mood and affect. Her behavior is normal. Judgment and thought content normal.  Vitals reviewed.   Filed Vitals:   10/17/15 1349  BP: 108/78  Pulse: 99  Height: _0  (1.626 m)   Weight: 295 lb (133.811 kg)  SpO2: 89%   89% on RA       Assessment:       ICD-9-CM ICD-10-CM   1. ILD (interstitial lung disease) ([place]) 515 J84.9   2. Bronchiectasis without complication ([facility]) 829.9 J47.9   3. Morbid obesity, unspecified obesity type ([place]) 278.01 E66.01        Plan:       Glad you are better with home PT and daily azithromycin from [facility] Re-refer to pulmonary rehab at [name] Refer to [facility] for weight loss Immunosuppressants through rheumatology - noted you are going to [place] for 2nd opinion  followup 4 months or sooner if needed   (> 50% of this 15 min visit spent in face to face counseling or/and coordination of care)   Dr. Brand Males, M.D., [facility].C.P Pulmonary and Critical Care Medicine Staff Physician [facility] Pager: [phone], If no answer or between  15:00h - 7:00h: call [phone]  10/17/2015 2:27 PM

## 2015-10-18 ENCOUNTER — Telehealth: Payer: Self-pay | Admitting: Internal Medicine

## 2015-10-18 DIAGNOSIS — Z7982 Long term (current) use of aspirin: Secondary | ICD-10-CM | POA: Diagnosis not present

## 2015-10-18 DIAGNOSIS — R531 Weakness: Secondary | ICD-10-CM | POA: Diagnosis not present

## 2015-10-18 DIAGNOSIS — Z9981 Dependence on supplemental oxygen: Secondary | ICD-10-CM | POA: Diagnosis not present

## 2015-10-18 DIAGNOSIS — Z6841 Body Mass Index (BMI) 40.0 and over, adult: Secondary | ICD-10-CM | POA: Diagnosis not present

## 2015-10-18 DIAGNOSIS — J849 Interstitial pulmonary disease, unspecified: Secondary | ICD-10-CM | POA: Diagnosis not present

## 2015-10-18 DIAGNOSIS — R2681 Unsteadiness on feet: Secondary | ICD-10-CM | POA: Diagnosis not present

## 2015-10-18 NOTE — Telephone Encounter (Signed)
Spoke with Velva Harman at [facility]. She states that the order that we placed is incorrect. They are needing a specific order entered. The order they are needing is built into our Epic system. Velva Harman will contact pt and make appointment based on the order we sent them. Nothing further was needed.

## 2015-10-22 ENCOUNTER — Other Ambulatory Visit: Payer: Self-pay

## 2015-10-22 DIAGNOSIS — J849 Interstitial pulmonary disease, unspecified: Secondary | ICD-10-CM

## 2015-10-23 ENCOUNTER — Telehealth: Payer: Self-pay | Admitting: Internal Medicine

## 2015-10-23 DIAGNOSIS — Z7982 Long term (current) use of aspirin: Secondary | ICD-10-CM | POA: Diagnosis not present

## 2015-10-23 DIAGNOSIS — J849 Interstitial pulmonary disease, unspecified: Secondary | ICD-10-CM | POA: Diagnosis not present

## 2015-10-23 DIAGNOSIS — R531 Weakness: Secondary | ICD-10-CM | POA: Diagnosis not present

## 2015-10-23 DIAGNOSIS — R2681 Unsteadiness on feet: Secondary | ICD-10-CM | POA: Diagnosis not present

## 2015-10-23 DIAGNOSIS — Z6841 Body Mass Index (BMI) 40.0 and over, adult: Secondary | ICD-10-CM | POA: Diagnosis not present

## 2015-10-23 DIAGNOSIS — Z9981 Dependence on supplemental oxygen: Secondary | ICD-10-CM | POA: Diagnosis not present

## 2015-10-23 MED ORDER — PREDNISONE 10 MG PO TABS: ORAL_TABLET | ORAL | Status: DC

## 2015-10-23 MED ORDER — CEPHALEXIN 500 MG PO CAPS: 500.0000 mg | ORAL_CAPSULE | Freq: Three times a day (TID) | ORAL | Status: DC

## 2015-10-23 NOTE — Telephone Encounter (Signed)
  Cephalexin 523m tid x 5 days  Please take prednisone 40 mg x1 day, then 30 mg x1 day, then 20 mg x1 day, then 10 mg x1 day, and then 5 mg x1 day and stop v go back to baseline dose     Allergies  Allergen Reactions  . Codeine Swelling    Facial swelling  . Remicade [Infliximab] Anaphylaxis    "sent me into shock"  . Zestril [Lisinopril] Swelling    Face and neck swelling

## 2015-10-23 NOTE — Telephone Encounter (Signed)
Spoke with pt. She is aware of MR's recommendations. Rxs have been called in. Nothing further was needed.

## 2015-10-23 NOTE — Telephone Encounter (Signed)
Spoke with pt. States that she has a chest cold. Reports cough, wheezing, chest tightness. Cough is producing yellow mucus. Denies SOB or fever. Symptoms started 4 days ago. Has been taking Mucinex and Mucinex cough syrup. Would like to have something sent in.  MR - please advise. Thanks.

## 2015-10-24 ENCOUNTER — Encounter: Payer: Medicare Other | Attending: Physical Medicine & Rehabilitation | Admitting: Registered Nurse

## 2015-10-24 ENCOUNTER — Encounter: Payer: Self-pay | Admitting: Registered Nurse

## 2015-10-24 VITALS — BP 120/74 | HR 88

## 2015-10-24 DIAGNOSIS — M4806 Spinal stenosis, lumbar region: Secondary | ICD-10-CM | POA: Diagnosis not present

## 2015-10-24 DIAGNOSIS — M47816 Spondylosis without myelopathy or radiculopathy, lumbar region: Secondary | ICD-10-CM

## 2015-10-24 DIAGNOSIS — M7062 Trochanteric bursitis, left hip: Secondary | ICD-10-CM | POA: Insufficient documentation

## 2015-10-24 DIAGNOSIS — Z79899 Other long term (current) drug therapy: Secondary | ICD-10-CM | POA: Diagnosis not present

## 2015-10-24 DIAGNOSIS — M5416 Radiculopathy, lumbar region: Secondary | ICD-10-CM | POA: Insufficient documentation

## 2015-10-24 DIAGNOSIS — J849 Interstitial pulmonary disease, unspecified: Secondary | ICD-10-CM

## 2015-10-24 DIAGNOSIS — M069 Rheumatoid arthritis, unspecified: Secondary | ICD-10-CM | POA: Insufficient documentation

## 2015-10-24 DIAGNOSIS — G894 Chronic pain syndrome: Secondary | ICD-10-CM | POA: Diagnosis not present

## 2015-10-24 DIAGNOSIS — M1711 Unilateral primary osteoarthritis, right knee: Secondary | ICD-10-CM

## 2015-10-24 DIAGNOSIS — E1140 Type 2 diabetes mellitus with diabetic neuropathy, unspecified: Secondary | ICD-10-CM | POA: Diagnosis not present

## 2015-10-24 DIAGNOSIS — Z5181 Encounter for therapeutic drug level monitoring: Secondary | ICD-10-CM | POA: Insufficient documentation

## 2015-10-24 DIAGNOSIS — M1712 Unilateral primary osteoarthritis, left knee: Secondary | ICD-10-CM

## 2015-10-24 MED ORDER — MORPHINE SULFATE ER 15 MG PO TBCR: 15.0000 mg | EXTENDED_RELEASE_TABLET | Freq: Two times a day (BID) | ORAL | Status: DC

## 2015-10-24 NOTE — Progress Notes (Signed)
Subjective:    Patient ID: [name], female    DOB: 1940-10-11, 75 y.o.   MRN: [id number]  HPI: [name] is a 75 year old female who returns for follow up appointment and medication refill. She states her pain is located in her lower back radiating into her left leg laterallyand left knee.She rates her pain 6. Her current exercise regime is receiving home physical therapy twice a week, walking in her home with cane or walker.   Pain Inventory Average Pain 6 Pain Right Now 6 My pain is sharp, burning and stabbing  In the last 24 hours, has pain interfered with the following? General activity 7 Relation with others 5 Enjoyment of life 5 What TIME of day is your pain at its worst? morning Sleep (in general) Fair  Pain is worse with: walking and bending Pain improves with: rest, therapy/exercise, pacing activities and medication Relief from Meds: 6  Mobility use a cane use a walker how many minutes can you walk? 5 min use a wheelchair  Function retired  Neuro/Psych bladder control problems numbness  Prior Studies Any changes since last visit?  no  Physicians involved in your care Any changes since last visit?  no   Family History  Problem Relation Age of Onset  . Diabetes Mother   . Heart attack Mother   . Hypertension Father   . Lung cancer Father   . Diabetes Sister   . Diabetes Brother   . Hypertension Brother   . Kidney cancer Brother   . Uterine cancer Daughter   . Breast cancer Sister   . Rheum arthritis Maternal Uncle   . Gout Brother   . Kidney failure Brother     x 5  . Heart disease Brother   . Heart attack Brother    Social History   Social History  . Marital Status: Married    Spouse Name: N/A  . Number of Children: 6  . Years of Education: college   Occupational History  . retired Marine scientist    Social History Main Topics  . Smoking status: Never Smoker   . Smokeless tobacco: Never Used  . Alcohol Use: No  . Drug  Use: No  . Sexual Activity: Not Currently   Other Topics Concern  . None   Social History Narrative   Patient consumes 2-3 cups coffee per day.   Past Surgical History  Procedure Laterality Date  . Abdominal hysterectomy    . Ovary surgery    . Shoulder surgery Left   . Brain surgery      Gamma knife 10/13. Needs repeat spring  '14  . Video bronchoscopy Bilateral 05/31/2013    Procedure: VIDEO BRONCHOSCOPY WITHOUT FLUORO;  Surgeon: Brand Males, MD;  Location: [facility];  Service: Cardiopulmonary;  Laterality: Bilateral;  . Video bronscoscopy  2000    lung  . Tonsillectomy  age 61  . Esophagogastroduodenoscopy (egd) with propofol N/A 09/14/2014    Procedure: ESOPHAGOGASTRODUODENOSCOPY (EGD) WITH PROPOFOL;  Surgeon: [name], MD;  Location: [facility];  Service: Endoscopy;  Laterality: N/A;  . Cardiac catheterization N/A 10/31/2014    Procedure: Right/Left Heart Cath and Coronary Angiography;  Surgeon: [name], MD;  Location: [facility];  Service: Cardiovascular;  Laterality: N/A;   Past Medical History  Diagnosis Date  . Diabetes mellitus   . Hypertension   . Thyroid disease   . Arthritis     endstage changes bilateral knees/bilateral ankles.   [name]  High cholesterol   . 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 (HCC) 02/17/2012    Continue diplopia, left eye pain and left headaches.    . Depression, reactive   . RA (rheumatoid arthritis) ([place])     has been off methotreaxte since 10/13.  . Contusion of left knee     due to fall 1/14.  [name] URI (upper respiratory infection)   . Clotting disorder ([place])     pt denies this  . Asthma   . Carotid artery occlusion   . Interstitial lung disease ([place])   . Morbid obesity ([place])   . Shortness of breath   . Family history of heart disease   . Chronic kidney disease   . COPD (chronic obstructive pulmonary disease) (HCC)     pulmonary fibrosis  . Normal coronary arteries     cardiac  catheterization performed  10/31/14  . Diastolic dysfunction    BP 120/74 mmHg  Pulse 88  SpO2 94%  Opioid Risk Score:   Fall Risk Score:  `1  Depression screen PHQ 2/9  Depression screen [facility] 2/9 08/09/2015 01/05/2015 09/20/2014 08/10/2013  Decreased Interest _0 -  Down, Depressed, Hopeless _1 PHQ - 2 Score _2 Altered sleeping - 2 0 -  Tired, decreased energy - 2 3 -  Change in appetite - 2 3 -  Feeling bad or failure about yourself  - 0 3 -  Trouble concentrating - 1 1 -  Moving slowly or fidgety/restless - 0 2 -  Suicidal thoughts - 0 0 -  PHQ-9 Score - 10 16 -  Difficult doing work/chores - Somewhat difficult - -     Review of Systems     Objective:   Physical Exam  Constitutional: She is oriented to person, place, and time. She appears well-developed and well-nourished.  HENT:  Head: Normocephalic and atraumatic.  Neck: Normal range of motion. Neck supple.  Cardiovascular: Normal rate and regular rhythm.   Pulmonary/Chest: Effort normal and breath sounds normal.  Musculoskeletal:  Normal Muscle Bulk and Muscle Testing Reveals: Upper Extremities: Full ROM and Muscle Strength 5/5 Thoracic Paraspinal Tenderness: T-3- T-5 Lumbar Paraspinal Tenderness: L-4- L-5 Lower Extremities: Decreased ROM and Muscle Strength 4/5 Arises from chair slowly using 4 prong cane for support Narrow based Gait  Neurological: She is alert and oriented to person, place, and time.  Skin: Skin is warm and dry.  Psychiatric: She has a normal mood and affect.  Nursing note and vitals reviewed.         Assessment & Plan:  1. Lumbar spinal stenosis with neurogenic claudication. Associated facet arthropathy: Continue Gabapentin and HEP as tolerated. Refilled : MS Contin 15 mg one tablet every 12 hours #60 and Continue MSIR 15 mg 1/2 to one tablet every 6 hours as needed. We will continue the opioid monitoring program, this consists of regular clinic visits, examinations,  urine drug screen, pill counts as well as use of [place] Controlled Substance reporting System. 2. Depression: Continue Cymbalta 3. Diabetes mellitus type 2 with polyneuropathy: Continue Gabapentin 4. Rheumatoid arthritis and osteoarthritis. Contnue Voltaren Gel 5. Interstitial lung disease: Continuous Oxygen and Pulmonology Following.    30 minutes of face to face patient care time was spent during this visit. All questions were encouraged and answered.  F/U in 1 month

## 2015-10-30 DIAGNOSIS — Z6841 Body Mass Index (BMI) 40.0 and over, adult: Secondary | ICD-10-CM | POA: Diagnosis not present

## 2015-10-30 DIAGNOSIS — R2681 Unsteadiness on feet: Secondary | ICD-10-CM | POA: Diagnosis not present

## 2015-10-30 DIAGNOSIS — Z7982 Long term (current) use of aspirin: Secondary | ICD-10-CM | POA: Diagnosis not present

## 2015-10-30 DIAGNOSIS — J849 Interstitial pulmonary disease, unspecified: Secondary | ICD-10-CM | POA: Diagnosis not present

## 2015-10-30 DIAGNOSIS — Z9981 Dependence on supplemental oxygen: Secondary | ICD-10-CM | POA: Diagnosis not present

## 2015-10-30 DIAGNOSIS — R531 Weakness: Secondary | ICD-10-CM | POA: Diagnosis not present

## 2015-11-01 DIAGNOSIS — Z9981 Dependence on supplemental oxygen: Secondary | ICD-10-CM | POA: Diagnosis not present

## 2015-11-01 DIAGNOSIS — Z6841 Body Mass Index (BMI) 40.0 and over, adult: Secondary | ICD-10-CM | POA: Diagnosis not present

## 2015-11-01 DIAGNOSIS — R2681 Unsteadiness on feet: Secondary | ICD-10-CM | POA: Diagnosis not present

## 2015-11-01 DIAGNOSIS — Z7982 Long term (current) use of aspirin: Secondary | ICD-10-CM | POA: Diagnosis not present

## 2015-11-01 DIAGNOSIS — R531 Weakness: Secondary | ICD-10-CM | POA: Diagnosis not present

## 2015-11-01 DIAGNOSIS — J849 Interstitial pulmonary disease, unspecified: Secondary | ICD-10-CM | POA: Diagnosis not present

## 2015-11-05 DIAGNOSIS — I272 Other secondary pulmonary hypertension: Secondary | ICD-10-CM | POA: Diagnosis not present

## 2015-11-05 DIAGNOSIS — E8770 Fluid overload, unspecified: Secondary | ICD-10-CM | POA: Diagnosis not present

## 2015-11-06 DIAGNOSIS — Z7982 Long term (current) use of aspirin: Secondary | ICD-10-CM | POA: Diagnosis not present

## 2015-11-06 DIAGNOSIS — R2681 Unsteadiness on feet: Secondary | ICD-10-CM | POA: Diagnosis not present

## 2015-11-06 DIAGNOSIS — Z6841 Body Mass Index (BMI) 40.0 and over, adult: Secondary | ICD-10-CM | POA: Diagnosis not present

## 2015-11-06 DIAGNOSIS — J849 Interstitial pulmonary disease, unspecified: Secondary | ICD-10-CM | POA: Diagnosis not present

## 2015-11-06 DIAGNOSIS — Z9981 Dependence on supplemental oxygen: Secondary | ICD-10-CM | POA: Diagnosis not present

## 2015-11-06 DIAGNOSIS — R531 Weakness: Secondary | ICD-10-CM | POA: Diagnosis not present

## 2015-11-07 ENCOUNTER — Other Ambulatory Visit: Payer: Self-pay | Admitting: Internal Medicine

## 2015-11-07 DIAGNOSIS — Z1231 Encounter for screening mammogram for malignant neoplasm of breast: Secondary | ICD-10-CM

## 2015-11-09 DIAGNOSIS — M1A00X0 Idiopathic chronic gout, unspecified site, without tophus (tophi): Secondary | ICD-10-CM | POA: Diagnosis not present

## 2015-11-09 DIAGNOSIS — M25561 Pain in right knee: Secondary | ICD-10-CM | POA: Diagnosis not present

## 2015-11-09 DIAGNOSIS — M0579 Rheumatoid arthritis with rheumatoid factor of multiple sites without organ or systems involvement: Secondary | ICD-10-CM | POA: Diagnosis not present

## 2015-11-09 DIAGNOSIS — I9589 Other hypotension: Secondary | ICD-10-CM | POA: Diagnosis not present

## 2015-11-12 DIAGNOSIS — I959 Hypotension, unspecified: Secondary | ICD-10-CM | POA: Diagnosis not present

## 2015-11-12 DIAGNOSIS — E1122 Type 2 diabetes mellitus with diabetic chronic kidney disease: Secondary | ICD-10-CM | POA: Diagnosis not present

## 2015-11-13 ENCOUNTER — Other Ambulatory Visit: Payer: Self-pay | Admitting: Internal Medicine

## 2015-11-13 DIAGNOSIS — J849 Interstitial pulmonary disease, unspecified: Secondary | ICD-10-CM | POA: Diagnosis not present

## 2015-11-13 DIAGNOSIS — Z7982 Long term (current) use of aspirin: Secondary | ICD-10-CM | POA: Diagnosis not present

## 2015-11-13 DIAGNOSIS — R2681 Unsteadiness on feet: Secondary | ICD-10-CM | POA: Diagnosis not present

## 2015-11-13 DIAGNOSIS — Z9981 Dependence on supplemental oxygen: Secondary | ICD-10-CM | POA: Diagnosis not present

## 2015-11-13 DIAGNOSIS — R531 Weakness: Secondary | ICD-10-CM | POA: Diagnosis not present

## 2015-11-13 DIAGNOSIS — Z6841 Body Mass Index (BMI) 40.0 and over, adult: Secondary | ICD-10-CM | POA: Diagnosis not present

## 2015-11-21 DIAGNOSIS — M19042 Primary osteoarthritis, left hand: Secondary | ICD-10-CM | POA: Diagnosis not present

## 2015-11-21 DIAGNOSIS — M79641 Pain in right hand: Secondary | ICD-10-CM | POA: Diagnosis not present

## 2015-11-21 DIAGNOSIS — M180 Bilateral primary osteoarthritis of first carpometacarpal joints: Secondary | ICD-10-CM | POA: Diagnosis not present

## 2015-11-21 DIAGNOSIS — M059 Rheumatoid arthritis with rheumatoid factor, unspecified: Secondary | ICD-10-CM | POA: Diagnosis not present

## 2015-11-21 DIAGNOSIS — M19041 Primary osteoarthritis, right hand: Secondary | ICD-10-CM | POA: Diagnosis not present

## 2015-11-21 DIAGNOSIS — I361 Nonrheumatic tricuspid (valve) insufficiency: Secondary | ICD-10-CM | POA: Diagnosis not present

## 2015-11-21 DIAGNOSIS — Z79899 Other long term (current) drug therapy: Secondary | ICD-10-CM | POA: Diagnosis not present

## 2015-11-21 DIAGNOSIS — M1009 Idiopathic gout, multiple sites: Secondary | ICD-10-CM | POA: Diagnosis not present

## 2015-11-21 DIAGNOSIS — I272 Other secondary pulmonary hypertension: Secondary | ICD-10-CM | POA: Diagnosis not present

## 2015-11-21 DIAGNOSIS — Z7982 Long term (current) use of aspirin: Secondary | ICD-10-CM | POA: Diagnosis not present

## 2015-11-21 DIAGNOSIS — M109 Gout, unspecified: Secondary | ICD-10-CM | POA: Diagnosis not present

## 2015-11-21 DIAGNOSIS — M79642 Pain in left hand: Secondary | ICD-10-CM | POA: Diagnosis not present

## 2015-11-21 DIAGNOSIS — M0589 Other rheumatoid arthritis with rheumatoid factor of multiple sites: Secondary | ICD-10-CM | POA: Diagnosis not present

## 2015-11-23 ENCOUNTER — Ambulatory Visit
Admission: RE | Admit: 2015-11-23 | Discharge: 2015-11-23 | Disposition: A | Discharge status: Home / Self Care | Payer: Medicare Other | Source: Ambulatory Visit | Attending: Internal Medicine | Admitting: Internal Medicine

## 2015-11-23 DIAGNOSIS — Z1231 Encounter for screening mammogram for malignant neoplasm of breast: Secondary | ICD-10-CM | POA: Diagnosis not present

## 2015-11-27 ENCOUNTER — Encounter: Payer: Medicare Other | Admitting: Registered Nurse

## 2015-11-30 ENCOUNTER — Encounter: Payer: Self-pay | Admitting: Registered Nurse

## 2015-11-30 ENCOUNTER — Encounter: Payer: Medicare Other | Attending: Physical Medicine & Rehabilitation | Admitting: Registered Nurse

## 2015-11-30 VITALS — BP 125/72 | HR 90 | Resp 17

## 2015-11-30 DIAGNOSIS — M1711 Unilateral primary osteoarthritis, right knee: Secondary | ICD-10-CM

## 2015-11-30 DIAGNOSIS — M069 Rheumatoid arthritis, unspecified: Secondary | ICD-10-CM | POA: Diagnosis not present

## 2015-11-30 DIAGNOSIS — M5416 Radiculopathy, lumbar region: Secondary | ICD-10-CM | POA: Diagnosis not present

## 2015-11-30 DIAGNOSIS — Z79899 Other long term (current) drug therapy: Secondary | ICD-10-CM | POA: Diagnosis not present

## 2015-11-30 DIAGNOSIS — M05761 Rheumatoid arthritis with rheumatoid factor of right knee without organ or systems involvement: Secondary | ICD-10-CM

## 2015-11-30 DIAGNOSIS — M4806 Spinal stenosis, lumbar region: Secondary | ICD-10-CM | POA: Insufficient documentation

## 2015-11-30 DIAGNOSIS — M1712 Unilateral primary osteoarthritis, left knee: Secondary | ICD-10-CM | POA: Diagnosis not present

## 2015-11-30 DIAGNOSIS — M7061 Trochanteric bursitis, right hip: Secondary | ICD-10-CM

## 2015-11-30 DIAGNOSIS — M7062 Trochanteric bursitis, left hip: Secondary | ICD-10-CM

## 2015-11-30 DIAGNOSIS — Z5181 Encounter for therapeutic drug level monitoring: Secondary | ICD-10-CM

## 2015-11-30 DIAGNOSIS — G894 Chronic pain syndrome: Secondary | ICD-10-CM | POA: Diagnosis not present

## 2015-11-30 DIAGNOSIS — M47816 Spondylosis without myelopathy or radiculopathy, lumbar region: Secondary | ICD-10-CM

## 2015-11-30 MED ORDER — MORPHINE SULFATE ER 15 MG PO TBCR: 15.0000 mg | EXTENDED_RELEASE_TABLET | Freq: Two times a day (BID) | ORAL | Status: DC

## 2015-11-30 NOTE — Progress Notes (Signed)
Subjective:    Patient ID: [name], female    DOB: 08-06-1940, 75 y.o.   MRN: [id number]  HPI: [name] is a 75 year old female who returns for follow up appointment and medication refill. She states her pain is located in her lower back, bilateral knees and bilateral ankles. She did not rate her pain today. Her current exercise regime is performing stretching exercises and  walking in her home with cane or walker.   [name] daughter was diagnosed with Leiomyosarcoma stage 4, she states.  Pain Inventory Average Pain 6 Pain Right Now NA My pain is stabbing  In the last 24 hours, has pain interfered with the following? General activity 5 Relation with others 6 Enjoyment of life 8 What TIME of day is your pain at its worst? morning, daytime  Sleep (in general) Fair  Pain is worse with: walking, bending, inactivity and standing Pain improves with: rest, heat/ice, therapy/exercise, pacing activities and medication Relief from Meds: 7  Mobility use a cane use a walker ability to climb steps?  no do you drive?  no use a wheelchair Do you have any goals in this area?  yes  Function retired I need assistance with the following:  household duties and shopping  Neuro/Psych bladder control problems numbness trouble walking spasms  Prior Studies Any changes since last visit?  no  Physicians involved in your care Any changes since last visit?  no   Family History  Problem Relation Age of Onset  . Diabetes Mother   . Heart attack Mother   . Hypertension Father   . Lung cancer Father   . Diabetes Sister   . Diabetes Brother   . Hypertension Brother   . Kidney cancer Brother   . Uterine cancer Daughter   . Breast cancer Sister   . Rheum arthritis Maternal Uncle   . Gout Brother   . Kidney failure Brother     x 5  . Heart disease Brother   . Heart attack Brother    Social History   Social History  . Marital Status: Married    Spouse  Name: N/A  . Number of Children: 6  . Years of Education: college   Occupational History  . retired Marine scientist    Social History Main Topics  . Smoking status: Never Smoker   . Smokeless tobacco: Never Used  . Alcohol Use: No  . Drug Use: No  . Sexual Activity: Not Currently   Other Topics Concern  . None   Social History Narrative   Patient consumes 2-3 cups coffee per day.   Past Surgical History  Procedure Laterality Date  . Abdominal hysterectomy    . Ovary surgery    . Shoulder surgery Left   . Brain surgery      Gamma knife 10/13. Needs repeat spring  '14  . Video bronchoscopy Bilateral 05/31/2013    Procedure: VIDEO BRONCHOSCOPY WITHOUT FLUORO;  Surgeon: Brand Males, MD;  Location: [facility] [place];  Service: Cardiopulmonary;  Laterality: Bilateral;  . Video bronscoscopy  2000    lung  . Tonsillectomy  age 56  . Esophagogastroduodenoscopy (egd) with propofol N/A 09/14/2014    Procedure: ESOPHAGOGASTRODUODENOSCOPY (EGD) WITH PROPOFOL;  Surgeon: [name], MD;  Location[facility];  Service: Endoscopy;  Laterality: N/A;  . Cardiac catheterization N/A 10/31/2014    Procedure: Right/Left Heart Cath and Coronary Angiography;  Surgeon: [name], MD;  Location: [facility];  Service:  Cardiovascular;  Laterality: N/A;   Past Medical History  Diagnosis Date  . Diabetes mellitus   . Hypertension   . Thyroid disease   . Arthritis     endstage changes bilateral knees/bilateral ankles.   . High cholesterol   . 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 (HCC) 02/17/2012    Continue diplopia, left eye pain and left headaches.    . Depression, reactive   . RA (rheumatoid arthritis) ([place])     has been off methotreaxte since 10/13.  . Contusion of left knee     due to fall 1/14.  [name] URI (upper respiratory infection)   . Clotting disorder ([place])     pt denies this  . Asthma   . Carotid artery occlusion   . Interstitial lung disease ([place])     . Morbid obesity ([place])   . Shortness of breath   . Family history of heart disease   . Chronic kidney disease   . COPD (chronic obstructive pulmonary disease) (HCC)     pulmonary fibrosis  . Normal coronary arteries     cardiac catheterization performed  10/31/14  . Diastolic dysfunction    BP 125/72 mmHg  Pulse 90  Resp 17  SpO2 91%  Opioid Risk Score:   Fall Risk Score:  `1  Depression screen PHQ 2/9  Depression screen [facility] 2/9 08/09/2015 01/05/2015 09/20/2014 08/10/2013  Decreased Interest _0 -  Down, Depressed, Hopeless _1 PHQ - 2 Score _2 Altered sleeping - 2 0 -  Tired, decreased energy - 2 3 -  Change in appetite - 2 3 -  Feeling bad or failure about yourself  - 0 3 -  Trouble concentrating - 1 1 -  Moving slowly or fidgety/restless - 0 2 -  Suicidal thoughts - 0 0 -  PHQ-9 Score - 10 16 -  Difficult doing work/chores - Somewhat difficult - -     Review of Systems  Constitutional:       Bladder control problems  Respiratory: Positive for shortness of breath and wheezing.        Respiratory infections   Cardiovascular:       Limb swelling   Musculoskeletal: Positive for gait problem.  Neurological: Positive for numbness.       Spasms   All other systems reviewed and are negative.      Objective:   Physical Exam  Constitutional: She is oriented to person, place, and time. She appears well-developed and well-nourished.  HENT:  Head: Normocephalic and atraumatic.  Neck: Normal range of motion. Neck supple.  Cardiovascular: Normal rate and regular rhythm.   Pulmonary/Chest: Effort normal and breath sounds normal.  Musculoskeletal:  Normal Muscle Bulk and Muscle Testing Reveals: Upper Extremities: Full ROM and Muscle Strength 5/5 Thoracic Paraspinal Tenderness: T-10- T-12 Lower Extremities: Full ROM and Muscle Strength 5/5 Arises from Table slowly using walker for support Narrow Based Gait  Neurological: She is alert and oriented to person,  place, and time.  Skin: Skin is warm and dry.  Psychiatric: She has a normal mood and affect.  Nursing note and vitals reviewed.         Assessment & Plan:  1. Lumbar spinal stenosis with neurogenic claudication. Associated facet arthropathy: Continue Lyrica and HEP as tolerated. Refilled : MS Contin 15 mg one tablet every 12 hours #60 and Continue MSIR 15 mg 1/2 to one tablet every 6 hours as needed.No  script given MSIR. We will continue the opioid monitoring program, this consists of regular clinic visits, examinations, urine drug screen, pill counts as well as use of [place] Controlled Substance reporting System. 2. Depression: Continue Cymbalta 3. Diabetes mellitus type 2 with polyneuropathy: Continue Gabapentin 4. Rheumatoid arthritis and osteoarthritis. Contnue Voltaren Gel 5. Interstitial lung disease: Continuous Oxygen and Pulmonology Following.    30 minutes of face to face patient care time was spent during this visit. All questions were encouraged and answered.  F/U in 1 month

## 2015-12-03 ENCOUNTER — Ambulatory Visit (HOSPITAL_COMMUNITY): Payer: Medicare Other

## 2015-12-03 ENCOUNTER — Telehealth (HOSPITAL_COMMUNITY): Payer: Self-pay | Admitting: *Deleted

## 2015-12-08 LAB — 6-ACETYLMORPHINE,TOXASSURE ADD
6-ACETYLMORPHINE: NEGATIVE
6-ACETYLMORPHINE: NOT DETECTED ng/mg{creat}

## 2015-12-08 LAB — TOXASSURE SELECT,+ANTIDEPR,UR: PDF: 0

## 2015-12-11 NOTE — Progress Notes (Signed)
Urine drug screen for this encounter is consistent for prescribed medications.

## 2015-12-13 DIAGNOSIS — H25013 Cortical age-related cataract, bilateral: Secondary | ICD-10-CM | POA: Diagnosis not present

## 2015-12-13 DIAGNOSIS — Z79899 Other long term (current) drug therapy: Secondary | ICD-10-CM | POA: Diagnosis not present

## 2015-12-13 DIAGNOSIS — H5203 Hypermetropia, bilateral: Secondary | ICD-10-CM | POA: Diagnosis not present

## 2015-12-13 DIAGNOSIS — M069 Rheumatoid arthritis, unspecified: Secondary | ICD-10-CM | POA: Diagnosis not present

## 2015-12-13 DIAGNOSIS — H2513 Age-related nuclear cataract, bilateral: Secondary | ICD-10-CM | POA: Diagnosis not present

## 2015-12-27 ENCOUNTER — Encounter (HOSPITAL_COMMUNITY): Payer: Self-pay | Admitting: Emergency Medicine

## 2015-12-27 ENCOUNTER — Emergency Department (HOSPITAL_COMMUNITY): Payer: Medicare Other

## 2015-12-27 ENCOUNTER — Emergency Department (HOSPITAL_COMMUNITY)
Admission: EM | Admit: 2015-12-27 | Discharge: 2015-12-27 | Disposition: A | Discharge status: Home / Self Care | Payer: Medicare Other | Attending: Emergency Medicine | Admitting: Emergency Medicine

## 2015-12-27 DIAGNOSIS — Z23 Encounter for immunization: Secondary | ICD-10-CM | POA: Insufficient documentation

## 2015-12-27 DIAGNOSIS — J449 Chronic obstructive pulmonary disease, unspecified: Secondary | ICD-10-CM | POA: Insufficient documentation

## 2015-12-27 DIAGNOSIS — Y929 Unspecified place or not applicable: Secondary | ICD-10-CM | POA: Insufficient documentation

## 2015-12-27 DIAGNOSIS — Z7982 Long term (current) use of aspirin: Secondary | ICD-10-CM | POA: Diagnosis not present

## 2015-12-27 DIAGNOSIS — Z79899 Other long term (current) drug therapy: Secondary | ICD-10-CM | POA: Insufficient documentation

## 2015-12-27 DIAGNOSIS — Y939 Activity, unspecified: Secondary | ICD-10-CM | POA: Insufficient documentation

## 2015-12-27 DIAGNOSIS — E1122 Type 2 diabetes mellitus with diabetic chronic kidney disease: Secondary | ICD-10-CM | POA: Diagnosis not present

## 2015-12-27 DIAGNOSIS — I130 Hypertensive heart and chronic kidney disease with heart failure and stage 1 through stage 4 chronic kidney disease, or unspecified chronic kidney disease: Secondary | ICD-10-CM | POA: Diagnosis not present

## 2015-12-27 DIAGNOSIS — S81812A Laceration without foreign body, left lower leg, initial encounter: Secondary | ICD-10-CM | POA: Diagnosis not present

## 2015-12-27 DIAGNOSIS — W230XXA Caught, crushed, jammed, or pinched between moving objects, initial encounter: Secondary | ICD-10-CM | POA: Insufficient documentation

## 2015-12-27 DIAGNOSIS — J45909 Unspecified asthma, uncomplicated: Secondary | ICD-10-CM | POA: Diagnosis not present

## 2015-12-27 DIAGNOSIS — Y999 Unspecified external cause status: Secondary | ICD-10-CM | POA: Insufficient documentation

## 2015-12-27 DIAGNOSIS — S81819A Laceration without foreign body, unspecified lower leg, initial encounter: Secondary | ICD-10-CM | POA: Diagnosis not present

## 2015-12-27 DIAGNOSIS — IMO0002 Reserved for concepts with insufficient information to code with codable children: Secondary | ICD-10-CM

## 2015-12-27 DIAGNOSIS — N189 Chronic kidney disease, unspecified: Secondary | ICD-10-CM | POA: Diagnosis not present

## 2015-12-27 DIAGNOSIS — S8992XA Unspecified injury of left lower leg, initial encounter: Secondary | ICD-10-CM | POA: Diagnosis present

## 2015-12-27 DIAGNOSIS — I5033 Acute on chronic diastolic (congestive) heart failure: Secondary | ICD-10-CM | POA: Diagnosis not present

## 2015-12-27 DIAGNOSIS — T148 Other injury of unspecified body region: Secondary | ICD-10-CM | POA: Diagnosis not present

## 2015-12-27 MED ORDER — TETANUS-DIPHTH-ACELL PERTUSSIS 5-2.5-18.5 LF-MCG/0.5 IM SUSP
0.5000 mL | Freq: Once | INTRAMUSCULAR | Status: AC
  Administered 2015-12-27: 0.5 mL via INTRAMUSCULAR
  Filled 2015-12-27: qty 0.5

## 2015-12-27 MED ORDER — LIDOCAINE-EPINEPHRINE 2 %-1:100000 IJ SOLN
10.0000 mL | Freq: Once | INTRAMUSCULAR | Status: AC
  Administered 2015-12-27: 10 mL
  Filled 2015-12-27: qty 1

## 2015-12-27 MED ORDER — CEPHALEXIN 500 MG PO CAPS: 500.0000 mg | ORAL_CAPSULE | Freq: Two times a day (BID) | ORAL | 0 refills | Status: DC

## 2015-12-27 NOTE — ED Notes (Signed)
Pt given discharge instructions including prescriptions and f/u appointments. Pt states understanding. Pt states receipt of all belongings.

## 2015-12-27 NOTE — Discharge Instructions (Signed)
Take your antibiotics as prescribed until he completed the dose. He may also take Tylenol as prescribed over-the-counter as needed for pain relief. I recommend resting, elevating and applying ice to her leg for 15-20 minutes 3-4 times daily to help with pain and swelling. Follow-up with your family doctor in 2 days for wound recheck. Return to the ED or be seen by her family doctor in 7 days for suture removal. Return to the emergency department if symptoms worsen or new onset of fever, redness, swelling, warmth, bleeding, drainage, numbness, tingling.

## 2015-12-27 NOTE — ED Provider Notes (Signed)
[facility] DEPT Provider Note   CSN: [id number] Arrival date & time: 12/27/15  1756  First Provider Contact:   First MD Initiated Contact with Patient 12/27/15 1827      By signing my name below, I, [name], attest that this documentation has been prepared under the direction and in the presence of [name], PA-C.  Electronically Signed: Julien Mcdaniel, ED Scribe. 12/27/15. 6:37 PM.    History   Chief Complaint Chief Complaint  Patient presents with  . Laceration    laceration from edge of car door  . Leg Pain   The history is provided by the patient and the EMS personnel. No language interpreter was used.   HPI Comments: [name] is a 75 y.o. female who has a PMhx of CKD, COPD, DM, HTN, and RA presents to the Emergency Department via [facility] presenting with a laceration to her left calf. Pt says she was getting out of her car when she cut her left calf on the corner of the car door. She has an approx. 12 cm laceration. EMS was called and had a bandage applied. Bleeding is currently controlled. Pt denies pain, numbness or tingling. She is not up to ate on her tetanus shot. Denies use of anticoagulants.  Past Medical History:  Diagnosis Date  . Arthritis    endstage changes bilateral knees/bilateral ankles.   . Asthma   . Carotid artery occlusion   . Chronic kidney disease   . Clotting disorder ([place])    pt denies this  . Contusion of left knee    due to fall 1/14.  [name] COPD (chronic obstructive pulmonary disease) (HCC)    pulmonary fibrosis  . Depression, reactive   . Diabetes mellitus   . Diastolic dysfunction   . Family history of heart disease   . High cholesterol   . Hypertension   . Interstitial lung disease ([place])   . 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    . Morbid obesity ([place])   . Normal coronary arteries    cardiac catheterization performed   10/31/14  . RA (rheumatoid arthritis) ([place])    has been off methotreaxte since 10/13.  [name] Shortness of breath   . Thyroid disease   . URI (upper respiratory infection)     Patient Active Problem List   Diagnosis Date Noted  . Tendinitis of left rotator cuff 06/13/2015  . Acute on chronic diastolic heart failure ([place]) 04/21/2015  . Acute pulmonary edema (HCC)   . AKI (acute kidney injury) ([place])   . Peripheral edema   . Acute on chronic respiratory failure with hypoxia ([place]) 04/17/2015  . CAP (community acquired pneumonia) 04/17/2015  . Acute kidney injury superimposed on chronic kidney disease ([place]) 04/17/2015  . Hypotension 04/17/2015  . Chronic respiratory failure with hypoxia ([place]) 02/09/2015  . Dyspnea and respiratory abnormality 01/03/2015  . Normal coronary arteries 11/02/2014  . Morbid obesity ([place]) 10/30/2014  . Dysphagia 08/21/2014  . Sore throat 07/18/2014  . Chronic fatigue 06/21/2014  . DOE (dyspnea on exertion) 05/24/2014  . Primary osteoarthritis of left knee 04/26/2014  . High risk medication use 04/17/2014  . Aphasia 02/12/2014  . 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 ([place]) 01/18/2014  . Lumbar facet arthropathy 01/18/2014  . Acute bronchitis 07/24/2013  . Bilateral edema of lower extremity 05/30/2013  . Chronic cough 05/24/2013  . ILD (interstitial lung disease) ([place]) 04/10/2013  .  Greater trochanteric bursitis of left hip 08/13/2012  . Spinal stenosis of lumbar region with radiculopathy 07/12/2012  . Inability to walk 07/12/2012  . Acute lumbar back pain 07/12/2012  . Asymptomatic bacteriuria 07/12/2012  . 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 Benign neoplasm of meninges ([place]) 02/12/2012  . Brain mass 01/28/2012  . Chest pain with moderate risk of acute coronary syndrome 01/28/2012  . Rheumatoid arthritis ([place]) 01/28/2012  . Primary osteoarthritis of right knee 01/28/2012  . Dyslipidemia   . Thyroid disease     . Essential hypertension     Past Surgical History:  Procedure Laterality Date  . ABDOMINAL HYSTERECTOMY    . BRAIN SURGERY     Gamma knife 10/13. Needs repeat spring  '14  . CARDIAC CATHETERIZATION N/A 10/31/2014   Procedure: Right/Left Heart Cath and Coronary Angiography;  Surgeon: [name], MD;  Location: [facility];  Service: Cardiovascular;  Laterality: N/A;  . ESOPHAGOGASTRODUODENOSCOPY (EGD) WITH PROPOFOL N/A 09/14/2014   Procedure: ESOPHAGOGASTRODUODENOSCOPY (EGD) WITH PROPOFOL;  Surgeon: [name], MD;  Location: [facility];  Service: Endoscopy;  Laterality: N/A;  . OVARY SURGERY    . SHOULDER SURGERY Left   . TONSILLECTOMY  age 82  . VIDEO BRONCHOSCOPY Bilateral 05/31/2013   Procedure: VIDEO BRONCHOSCOPY WITHOUT FLUORO;  Surgeon: Brand Males, MD;  Location: [facility];  Service: Cardiopulmonary;  Laterality: Bilateral;  . video bronscoscopy  2000   lung    OB History    No data available       Home Medications    Prior to Admission medications   Medication Sig Start Date End Date Taking? Authorizing Provider  ACCU-CHEK SMARTVIEW test strip 1 each by Other route every morning.  03/18/14   Historical Provider, MD  acetaminophen (TYLENOL) 500 MG tablet 2 by mouth twice daily for mild pain, 2 by mouth twice daily as needed for moderate painTakes with tramadol    Historical Provider, MD  albuterol (PROVENTIL HFA;VENTOLIN HFA) 108 (90 BASE) MCG/ACT inhaler Inhale 2 puffs into the lungs every 6 (six) hours as needed for wheezing or shortness of breath.    Historical Provider, MD  allopurinol (ZYLOPRIM) 100 MG tablet Take 2 tablets (200 mg total) by mouth daily. 06/21/14   Meredith Staggers, MD  aspirin EC 81 MG tablet Take 81 mg by mouth every morning.     Historical Provider, MD  atorvastatin (LIPITOR) 20 MG tablet Take 10 mg by mouth every evening.     Historical Provider, MD  azithromycin (ZITHROMAX) 500 MG tablet Take 1 tablet by mouth daily.  TAKE ON MON, WED & FRI 09/20/15   Historical Provider, MD  Calcium Carb-Cholecalciferol (CALCIUM + D3 PO) Take 2 tablets by mouth daily with lunch.     Historical Provider, MD  cephALEXin (KEFLEX) 500 MG capsule Take 1 capsule (500 mg total) by mouth 2 (two) times daily. 12/27/15   Nona Dell, PA-C  cholecalciferol (VITAMIN D) 1000 UNITS tablet Take 2,000 Units by mouth daily with lunch.     Historical Provider, MD  diclofenac sodium (VOLTAREN) 1 % GEL Apply 1 application topically 3 (three) times daily. 03/22/15   Meredith Staggers, MD  DULoxetine (CYMBALTA) 60 MG capsule Take 1 capsule (60 mg total) by mouth daily. 11/15/14   Meredith Staggers, MD  famotidine (PEPCID) 20 MG tablet Take 1 tablet (20 mg total) by mouth at bedtime. 11/02/14   Erlene Quan, PA-C  fluticasone [facility])  50 MCG/ACT nasal spray Place 2 sprays into both nostrils daily.    Historical Provider, MD  furosemide (LASIX) 40 MG tablet Take 1-2 tablets (40-80 mg total) by mouth daily. Alternate doses, take 80 mg one day then 40 mg the next. Patient taking differently: Take 80 mg by mouth daily. Alternate doses, take 80 mg one day then 40 mg the next. 04/22/15   Costin Karlyne Greenspan, MD  guaiFENesin (MUCINEX) 600 MG 12 hr tablet Take 1,200 mg by mouth 2 (two) times daily.    Historical Provider, MD  levothyroxine (SYNTHROID, LEVOTHROID) 50 MCG tablet Take 1 tablet (50 mcg total) by mouth daily. 07/23/12   Bary Leriche, PA-C  metoprolol succinate (TOPROL-XL) 100 MG 24 hr tablet Take 100 mg by mouth every morning. Take with or immediately following a meal.    Historical Provider, MD  morphine (MS CONTIN) 15 MG 12 hr tablet Take 1 tablet (15 mg total) by mouth every 12 (twelve) hours. 11/30/15   [name], NP  morphine (MSIR) 15 MG tablet Take 1/2 to 1 tablet by mouth every 6 hours as needed for pain. 09/24/15   Meredith Staggers, MD  Multiple Vitamin (MULTIVITAMIN WITH MINERALS) TABS Take 1 tablet by mouth daily with lunch.      Historical Provider, MD  olmesartan-hydrochlorothiazide (BENICAR HCT) 40-25 MG tablet Take by mouth. 05/04/14   Historical Provider, MD  OXYGEN-HELIUM IN Inhale 2 L into the lungs at bedtime. And on exertion    Historical Provider, MD  pantoprazole (PROTONIX) 40 MG tablet take 1 tablet by mouth once daily MINUTES BEFORE 1ST MEAL OF THE DAY 11/15/15   Brand Males, MD  predniSONE (DELTASONE) 10 MG tablet Take 10 mg by mouth every morning. 06/20/15   Historical Provider, MD  predniSONE (DELTASONE) 10 MG tablet 40 mg x1 day, then 30 mg x1 day, then 20 mg x1 day, then 10 mg x1 day, and then 5 mg x1 day and stop 10/23/15   Brand Males, MD  pregabalin (LYRICA) 100 MG capsule Take 1 capsule (100 mg total) by mouth 3 (three) times daily. 09/24/15   Meredith Staggers, MD  sodium chloride (OCEAN) 0.65 % SOLN nasal spray Place 1 spray into both nostrils as needed for congestion.    Historical Provider, MD  Spacer/Aero-Holding Chambers (AEROCHAMBER MV) inhaler Use as instructed 06/10/13   Tammy S Parrett, NP  VICTOZA 18 MG/3ML SOPN Inject 1.8 mg as directed daily.  01/26/15   Historical Provider, MD    Family History Family History  Problem Relation Age of Onset  . Diabetes Mother   . Heart attack Mother   . Hypertension Father   . Lung cancer Father   . Diabetes Sister   . Diabetes Brother   . Hypertension Brother   . Heart disease Brother   . Heart attack Brother   . Kidney cancer Brother   . Uterine cancer Daughter   . Breast cancer Sister   . Rheum arthritis Maternal Uncle   . Gout Brother   . Kidney failure Brother     x 5    Social History Social History  Substance Use Topics  . Smoking status: Never Smoker  . Smokeless tobacco: Never Used  . Alcohol use No     Allergies   Codeine; Remicade [infliximab]; and Zestril [lisinopril]   Review of Systems Review of Systems  Skin: Positive for wound.  Neurological: Negative for weakness and numbness.     Physical Exam Updated  Vital Signs BP 115/73 (BP Location: Left Arm)   Pulse 104   Resp 20   Wt 129.3 kg   SpO2 95%   BMI 48.92 kg/m   Physical Exam  Constitutional: She is oriented to person, place, and time. She appears well-developed and well-nourished.  Morbidly obese  HENT:  Head: Normocephalic and atraumatic.  Right Ear: External ear normal.  Left Ear: External ear normal.  Eyes: Conjunctivae are normal. No scleral icterus.  Neck: No tracheal deviation present.  Pulmonary/Chest: Effort normal. No respiratory distress.  Abdominal: She exhibits no distension.  Musculoskeletal: Normal range of motion.  Tenderness over left anterior shin around laceration. Full ROM of left foot, ankle and knee. Sensation grossly intact, 2+ DP pulse.  Neurological: She is alert and oriented to person, place, and time.  Skin: Skin is warm and dry.  12 cm gaping laceration noted to left anterior tib/fib. Subcutaneous fat visible. No visible muscle, tendons or bones. No obvious foreign bodies seen.  Psychiatric: She has a normal mood and affect. Her behavior is normal.  Nursing note and vitals reviewed.    ED Treatments / Results  COORDINATION OF CARE:  6:32 PM Discussed treatment plan which includes suture the laceration and update tetanus shot with pt at bedside and pt agreed to plan.  Labs (all labs ordered are listed, but only abnormal results are displayed) Labs Reviewed - No data to display  EKG  EKG Interpretation None       Radiology Dg Tibia/fibula Left  Result Date: 12/27/2015 CLINICAL DATA:  Pt says she was getting out of her car when she cut her left calf on the car door. She has an approx. 12 cm laceration. EXAM: LEFT TIBIA AND FIBULA - 2 VIEW COMPARISON:  None. FINDINGS: No fracture.  No bone lesion. Knee and ankle joints are normally aligned. There are osteoarthritic changes at the knee. There is diffuse subcutaneous soft tissue edema. No radiopaque foreign body. Soft tissue injury is seen over  the lateral mid to lower leg. IMPRESSION: 1. No fracture, bone lesion or dislocation. No radiopaque foreign body. Electronically Signed   By: Lajean Manes M.D.   On: 12/27/2015 19:11    Procedures .Marland KitchenLaceration Repair Date/Time: 12/27/2015 9:20 PM Performed by: Nona Dell Authorized by: Nona Dell   Consent:    Consent obtained:  Verbal   Consent given by:  Patient Anesthesia (see MAR for exact dosages):    Anesthesia method:  Local infiltration   Local anesthetic:  Lidocaine 2% WITH epi Laceration details:    Location:  Leg   Leg location:  L lower leg   Length (cm):  12 Repair type:    Repair type:  Simple Pre-procedure details:    Preparation:  Patient was prepped and draped in usual sterile fashion and imaging obtained to evaluate for foreign bodies Exploration:    Wound exploration: wound explored through full range of motion and entire depth of wound probed and visualized   Treatment:    Area cleansed with:  Betadine and saline   Amount of cleaning:  Extensive   Irrigation solution:  Sterile saline   Irrigation method:  Syringe   Visualized foreign bodies/material removed: no   Skin repair:    Repair method:  Sutures, Steri-Strips and tissue adhesive   Suture size:  4-0   Suture material:  Prolene   Suture technique:  Simple interrupted   Number of sutures:  14 Approximation:    Approximation:  Close  [place] border: well-aligned   Post-procedure details:    Dressing:  Non-adherent dressing   Patient tolerance of procedure:  Tolerated well, no immediate complications   (including critical care time)  Medications Ordered in ED Medications  lidocaine-EPINEPHrine (XYLOCAINE W/EPI) 2 %-1:100000 (with pres) injection 10 mL (10 mLs Infiltration Given 12/27/15 1918)  Tdap (BOOSTRIX) injection 0.5 mL (0.5 mLs Intramuscular Given 12/27/15 1918)     Initial Impression / Assessment and Plan / ED Course  I have reviewed the triage vital  signs and the nursing notes.  Pertinent labs & imaging results that were available during my care of the patient were reviewed by me and considered in my medical decision making (see chart for details).  Clinical Course    Pressure irrigation performed. Wound explored and base of wound visualized in a bloodless field without evidence of foreign body.  Laceration occurred < 8 hours prior to repair which was well tolerated. Leg xray negative. Tdap updated.  Pt has with comorbidity of DM which may effect normal wound healing. Pt discharged with antibiotics.  Discussed suture home care with patient and answered questions. Pt to follow-up for wound check in 2 days and suture removal in 7 days; they are to return to the ED sooner for signs of infection. Pt is hemodynamically stable with no complaints prior to dc.    Final Clinical Impressions(s) / ED Diagnoses   Final diagnoses:  Laceration    New Prescriptions New Prescriptions   CEPHALEXIN (KEFLEX) 500 MG CAPSULE    Take 1 capsule (500 mg total) by mouth 2 (two) times daily.   I personally performed the services described in this documentation, which was scribed in my presence. The recorded information has been reviewed and is accurate.    Chesley Noon [place], [place] 12/27/15 2123    Jola Schmidt, MD 12/27/15 [phone]

## 2015-12-27 NOTE — ED Triage Notes (Addendum)
Per EMS- family called EMS with c/o laceration to l/calf. Pt cut self on edge of car door. Pt is alert, oriented and ambulated 2 steps to stretcher. Family with pt. Bandage applied by EMS-stated that approx. 5 inch  gash is an open wound with poorly approximated edges

## 2015-12-31 DIAGNOSIS — Z09 Encounter for follow-up examination after completed treatment for conditions other than malignant neoplasm: Secondary | ICD-10-CM | POA: Diagnosis not present

## 2015-12-31 DIAGNOSIS — W228XXD Striking against or struck by other objects, subsequent encounter: Secondary | ICD-10-CM | POA: Diagnosis not present

## 2015-12-31 DIAGNOSIS — S81812D Laceration without foreign body, left lower leg, subsequent encounter: Secondary | ICD-10-CM | POA: Diagnosis not present

## 2016-01-02 ENCOUNTER — Encounter: Payer: Self-pay | Admitting: Cardiovascular Disease

## 2016-01-02 ENCOUNTER — Ambulatory Visit (INDEPENDENT_AMBULATORY_CARE_PROVIDER_SITE_OTHER): Payer: Medicare Other | Admitting: Cardiovascular Disease

## 2016-01-02 VITALS — BP 121/79 | HR 98 | Ht 64.0 in | Wt 286.2 lb

## 2016-01-02 DIAGNOSIS — E785 Hyperlipidemia, unspecified: Secondary | ICD-10-CM

## 2016-01-02 DIAGNOSIS — IMO0001 Reserved for inherently not codable concepts without codable children: Secondary | ICD-10-CM

## 2016-01-02 DIAGNOSIS — Z0389 Encounter for observation for other suspected diseases and conditions ruled out: Secondary | ICD-10-CM

## 2016-01-02 DIAGNOSIS — I10 Essential (primary) hypertension: Secondary | ICD-10-CM

## 2016-01-02 NOTE — Assessment & Plan Note (Signed)
History of hyperlipidemia on statin therapy with her last lipid profile performed 10/31/14 revealing total cholesterol 168, LDL 102 and HDL of 49.

## 2016-01-02 NOTE — Assessment & Plan Note (Signed)
History of normal coronary arteries and normal left ventricular function by cardiac catheterization performed by Dr. Irish Lack 10/31/14.

## 2016-01-02 NOTE — Progress Notes (Signed)
01/02/2016 [name]   September 13, 1940  [id number]  Primary Physician Maximino Greenland, MD Primary Cardiologist: Lorretta Harp MD Renae Gloss  HPI:  Ms. Wigal is a 75 year old moderately overweight married African American female mother of 6 children, grandmother of a grandchildren referred by [name] for cardiovascular evaluation because of progressive dyspnea on exertion and chest pain. I last saw her in the office 05/23/15.[name] Her cardiovascular risk factor are notable for a strong family history of heart disease with her mother and 91 brothers old with an ischemic heart disease. She has never had a heart attack or stroke. She does have interstitial lung disease as well as rheumatoid arthritis. She is on home oxygen. She also has a brain tumor/meningioma 2 with gamma knife therapy She's had progressive disabling dyspnea as well as daily chest pain. She underwent cardiac catheterization 10/31/14 by Dr. Irish Lack revealing normal coronary arteries and normal LV function. She did have mild pulmonary hypertension. 2-D echo showed normal LV function with grade 1 diastolic dysfunction and mild pulmonary hypertension. Her symptoms really have not changed on by mouth diuretics. She was referred to [facility] for further evaluation and treatment of interstitial lung disease. Since beginning an antibiotic and an inhaled bronchodilator her shortness of breath is somewhat improved.   Current Outpatient Prescriptions  Medication Sig Dispense Refill  . ACCU-CHEK SMARTVIEW test strip 1 each by Other route every morning.   1  . acetaminophen (TYLENOL) 500 MG tablet 2 by mouth twice daily for mild pain, 2 by mouth twice daily as needed for moderate painTakes with tramadol    . albuterol (PROVENTIL HFA;VENTOLIN HFA) 108 (90 BASE) MCG/ACT inhaler Inhale 2 puffs into the lungs every 6 (six) hours as needed for wheezing or shortness of breath.    . allopurinol (ZYLOPRIM)  100 MG tablet Take 2 tablets (200 mg total) by mouth daily. 60 tablet 4  . aspirin EC 81 MG tablet Take 81 mg by mouth every morning.     [name] atorvastatin (LIPITOR) 20 MG tablet Take 10 mg by mouth every evening.     [name] azithromycin (ZITHROMAX) 500 MG tablet Take 1 tablet by mouth daily. TAKE ON MON, WED & FRI  0  . Calcium Carb-Cholecalciferol (CALCIUM + D3 PO) Take 2 tablets by mouth daily with lunch.     . cephALEXin (KEFLEX) 500 MG capsule Take 1 capsule (500 mg total) by mouth 2 (two) times daily. 14 capsule 0  . cholecalciferol (VITAMIN D) 1000 UNITS tablet Take 2,000 Units by mouth daily with lunch.     . colchicine 0.6 MG tablet Take 0.6 mg by mouth daily.    . diclofenac sodium (VOLTAREN) 1 % GEL Apply 1 application topically 3 (three) times daily. 3 Tube 4  . DULoxetine (CYMBALTA) 60 MG capsule Take 1 capsule (60 mg total) by mouth daily. 30 capsule 4  . famotidine (PEPCID) 20 MG tablet Take 1 tablet (20 mg total) by mouth at bedtime. 30 tablet 11  . fluticasone (FLONASE) 50 MCG/ACT nasal spray Place 2 sprays into both nostrils daily.    . furosemide (LASIX) 40 MG tablet Take 40 mg by mouth daily.    [name] guaiFENesin (MUCINEX) 600 MG 12 hr tablet Take 1,200 mg by mouth 2 (two) times daily.    [name] levothyroxine (SYNTHROID, LEVOTHROID) 50 MCG tablet Take 1 tablet (50 mcg total) by mouth daily. 30 tablet 1  . metoprolol succinate (TOPROL-XL) 100 MG 24 hr  tablet Take 100 mg by mouth every morning. Take with or immediately following a meal.    . morphine (MS CONTIN) 15 MG 12 hr tablet Take 1 tablet (15 mg total) by mouth every 12 (twelve) hours. 60 tablet 0  . morphine (MSIR) 15 MG tablet Take 1/2 to 1 tablet by mouth every 6 hours as needed for pain. 120 tablet 0  . Multiple Vitamin (MULTIVITAMIN WITH MINERALS) TABS Take 1 tablet by mouth daily with lunch.     . olmesartan-hydrochlorothiazide (BENICAR HCT) 40-25 MG tablet Take 1 tablet by mouth daily.     . OXYGEN-HELIUM IN Inhale 2 L into the  lungs at bedtime. And on exertion    . pantoprazole (PROTONIX) 40 MG tablet take 1 tablet by mouth once daily MINUTES BEFORE 1ST MEAL OF THE DAY 30 tablet 5  . predniSONE (DELTASONE) 10 MG tablet Take 10 mg by mouth every morning.  0  . pregabalin (LYRICA) 100 MG capsule Take 1 capsule (100 mg total) by mouth 3 (three) times daily. 90 capsule 3  . sodium chloride (OCEAN) 0.65 % SOLN nasal spray Place 1 spray into both nostrils as needed for congestion.    [name] Spacer/Aero-Holding Chambers (AEROCHAMBER MV) inhaler Use as instructed 1 each 0  . VICTOZA 18 MG/3ML SOPN Inject 1.8 mg as directed daily.   0   No current facility-administered medications for this visit.     Allergies  Allergen Reactions  . Codeine Swelling    Facial swelling  . Remicade [Infliximab] Anaphylaxis    "sent me into shock"  . Zestril [Lisinopril] Swelling    Face and neck swelling    Social History   Social History  . Marital status: Married    Spouse name: N/A  . Number of children: 6  . Years of education: college   Occupational History  . retired Marine scientist    Social History Main Topics  . Smoking status: Never Smoker  . Smokeless tobacco: Never Used  . Alcohol use No  . Drug use: No  . Sexual activity: Not Currently   Other Topics Concern  . Not on file   Social History Narrative   Patient consumes 2-3 cups coffee per day.     Review of Systems: General: negative for chills, fever, night sweats or weight changes.  Cardiovascular: negative for chest pain, dyspnea on exertion, edema, orthopnea, palpitations, paroxysmal nocturnal dyspnea or shortness of breath Dermatological: negative for rash Respiratory: negative for cough or wheezing Urologic: negative for hematuria Abdominal: negative for nausea, vomiting, diarrhea, bright red blood per rectum, melena, or hematemesis Neurologic: negative for visual changes, syncope, or dizziness All other systems reviewed and are otherwise negative except as  noted above.    Blood pressure 121/79, pulse 98, height _0  (1.626 m), weight 286 lb 3.2 oz (129.8 kg).  General appearance: alert and no distress Neck: no adenopathy, no carotid bruit, no JVD, supple, symmetrical, trachea midline and thyroid not enlarged, symmetric, no tenderness/mass/nodules Lungs: clear to auscultation bilaterally Heart: regular rate and rhythm, S1, S2 normal, no murmur, click, rub or gallop Extremities: extremities normal, atraumatic, no cyanosis or edema  EKG normal sinus rhythm at 98 left axis deviation. Procedure reviewed this EKG  ASSESSMENT AND PLAN:   Dyslipidemia History of hyperlipidemia on statin therapy with her last lipid profile performed 10/31/14 revealing total cholesterol 168, LDL 102 and HDL of 49.  Essential hypertension History of hypertension blood pressure measured at 121/79. She is on metoprolol, Benicar and  hydrochlorothiazide. Continue current meds at current dosing  Normal coronary arteries History of normal coronary arteries and normal left ventricular function by cardiac catheterization performed by Dr. Irish Lack 10/31/14.      Lorretta Harp MD FACP,FACC,FAHA, [facility] 01/02/2016 3:33 PM

## 2016-01-02 NOTE — Patient Instructions (Signed)
Medication Instructions:  Your physician recommends that you continue on your current medications as directed. Please refer to the Current Medication list given to you today.   Follow-Up: Your physician recommends that you schedule a follow-up appointment ON AN AS NEEDED BASIS.  If you need a refill on your cardiac medications before your next appointment, please call your pharmacy.

## 2016-01-02 NOTE — Assessment & Plan Note (Signed)
History of hypertension blood pressure measured at 121/79. She is on metoprolol, Benicar and hydrochlorothiazide. Continue current meds at current dosing

## 2016-01-03 ENCOUNTER — Other Ambulatory Visit: Payer: Self-pay | Admitting: Cardiology

## 2016-01-03 ENCOUNTER — Emergency Department (HOSPITAL_COMMUNITY)
Admission: EM | Admit: 2016-01-03 | Discharge: 2016-01-03 | Disposition: A | Discharge status: Home / Self Care | Payer: Medicare Other | Attending: Emergency Medicine | Admitting: Emergency Medicine

## 2016-01-03 DIAGNOSIS — N189 Chronic kidney disease, unspecified: Secondary | ICD-10-CM | POA: Diagnosis not present

## 2016-01-03 DIAGNOSIS — Z4802 Encounter for removal of sutures: Secondary | ICD-10-CM | POA: Diagnosis not present

## 2016-01-03 DIAGNOSIS — J45909 Unspecified asthma, uncomplicated: Secondary | ICD-10-CM | POA: Diagnosis not present

## 2016-01-03 DIAGNOSIS — J449 Chronic obstructive pulmonary disease, unspecified: Secondary | ICD-10-CM | POA: Diagnosis not present

## 2016-01-03 DIAGNOSIS — Z4889 Encounter for other specified surgical aftercare: Secondary | ICD-10-CM

## 2016-01-03 DIAGNOSIS — Z791 Long term (current) use of non-steroidal anti-inflammatories (NSAID): Secondary | ICD-10-CM | POA: Insufficient documentation

## 2016-01-03 DIAGNOSIS — Z7982 Long term (current) use of aspirin: Secondary | ICD-10-CM | POA: Insufficient documentation

## 2016-01-03 DIAGNOSIS — L03116 Cellulitis of left lower limb: Secondary | ICD-10-CM | POA: Insufficient documentation

## 2016-01-03 DIAGNOSIS — I130 Hypertensive heart and chronic kidney disease with heart failure and stage 1 through stage 4 chronic kidney disease, or unspecified chronic kidney disease: Secondary | ICD-10-CM | POA: Diagnosis not present

## 2016-01-03 DIAGNOSIS — E1122 Type 2 diabetes mellitus with diabetic chronic kidney disease: Secondary | ICD-10-CM | POA: Diagnosis not present

## 2016-01-03 DIAGNOSIS — Z79899 Other long term (current) drug therapy: Secondary | ICD-10-CM | POA: Diagnosis not present

## 2016-01-03 DIAGNOSIS — I509 Heart failure, unspecified: Secondary | ICD-10-CM | POA: Insufficient documentation

## 2016-01-03 MED ORDER — SULFAMETHOXAZOLE-TRIMETHOPRIM 800-160 MG PO TABS: 1.0000 | ORAL_TABLET | Freq: Two times a day (BID) | ORAL | 0 refills | Status: AC

## 2016-01-03 MED ORDER — CEPHALEXIN 500 MG PO CAPS: 500.0000 mg | ORAL_CAPSULE | Freq: Four times a day (QID) | ORAL | 0 refills | Status: DC

## 2016-01-03 NOTE — ED Provider Notes (Deleted)
   Pt returns to the ED for wound repair. Pt discharged after laceration to her scalp today, she reports after showering she noted laceration extended into area that was previously closed due to matted hair keeping it in place. Remaining wound was repaired, pt denied any addition or further complaints than noted in previous providers note. Please see previous note for full H and P.  LACERATION REPAIR Performed by: Elmer Ramp Authorized by: Elmer Ramp Consent: Verbal consent obtained. Risks and benefits: risks, benefits and alternatives were discussed Consent given by: patient Patient identity confirmed: provided demographic data  Wound explored  Laceration Location: scalp  Laceration Length: 13 cm  No Foreign Bodies seen or palpated  Anesthesia: local infiltration  Local anesthetic:none  Anesthetic total: none  Irrigation method: syringe Amount of cleaning: standard  Skin closure: simple  Number of staples 10  Technique: staple  Patient tolerance: Patient tolerated the procedure well with no immediate complications.   Okey Regal, PA-C 01/03/16 1718

## 2016-01-03 NOTE — Telephone Encounter (Signed)
REFILL 

## 2016-01-03 NOTE — Discharge Instructions (Signed)
Please use antibiotics as directed, please follow-up with your primary care provider or the emergency room in 2-3 days for reevaluation. If new or worsening symptoms or symptoms present please return for immediate evaluation.

## 2016-01-03 NOTE — ED Provider Notes (Signed)
[facility] DEPT Provider Note   CSN: [id number] Arrival date & time: 01/03/16  1559   By signing my name below, I, Shanna Cisco, attest that this documentation has been prepared under the direction and in the presence of [employer], PA-C. Electronically signed by: Shanna Cisco, ED Scribe. 01/03/16. 4:39 PM.   History   Chief Complaint Chief Complaint  Patient presents with  . Suture / Staple Removal    The history is provided by the patient. No language interpreter was used.   HPI Comments:  [name] is a 75 y.o. female with a history of DMII who presents to the Emergency Department for suture removal. Pt reports that she had an accident last Thursday and had a 12 cm laceration to her left calf. Associated symptoms include occasional bleeding from site, some redness and pain with palpation. Pt reports that she has finished her course of Keflex. Denies fever, presence of pus, nausea or vomiting. Pt has history of MRSA and staph infection. Patient reports that originally she had redness to the site of the wound, notes that this had improved since initial onset. She reports that she followed up with her primary care provider who stated that she would likely need more antibiotics.   Past Medical History:  Diagnosis Date  . Arthritis    endstage changes bilateral knees/bilateral ankles.   . Asthma   . Carotid artery occlusion   . Chronic kidney disease   . Clotting disorder ([place])    pt denies this  . Contusion of left knee    due to fall 1/14.  [name] COPD (chronic obstructive pulmonary disease) (HCC)    pulmonary fibrosis  . Depression, reactive   . Diabetes mellitus   . Diastolic dysfunction   . Family history of heart disease   . High cholesterol   . Hypertension   . Interstitial lung disease ([place])   . 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    . Morbid obesity ([place])   .  Normal coronary arteries    cardiac catheterization performed  10/31/14  . RA (rheumatoid arthritis) ([place])    has been off methotreaxte since 10/13.  [name] Shortness of breath   . Thyroid disease   . URI (upper respiratory infection)     Patient Active Problem List   Diagnosis Date Noted  . Tendinitis of left rotator cuff 06/13/2015  . Acute on chronic diastolic heart failure ([place]) 04/21/2015  . Acute pulmonary edema (HCC)   . AKI (acute kidney injury) ([place])   . Peripheral edema   . Acute on chronic respiratory failure with hypoxia ([place]) 04/17/2015  . CAP (community acquired pneumonia) 04/17/2015  . Acute kidney injury superimposed on chronic kidney disease ([place]) 04/17/2015  . Hypotension 04/17/2015  . Chronic respiratory failure with hypoxia ([place]) 02/09/2015  . Dyspnea and respiratory abnormality 01/03/2015  . Normal coronary arteries 11/02/2014  . Morbid obesity ([place]) 10/30/2014  . Dysphagia 08/21/2014  . Sore throat 07/18/2014  . Chronic fatigue 06/21/2014  . DOE (dyspnea on exertion) 05/24/2014  . Primary osteoarthritis of left knee 04/26/2014  . High risk medication use 04/17/2014  . Aphasia 02/12/2014  . 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 Lumbar facet arthropathy 01/18/2014  . Acute bronchitis 07/24/2013  . Bilateral edema of lower extremity 05/30/2013  . Chronic cough 05/24/2013  . ILD (interstitial lung disease) ([place]) 04/10/2013  . Greater  trochanteric bursitis of left hip 08/13/2012  . Spinal stenosis of lumbar region with radiculopathy 07/12/2012  . Inability to walk 07/12/2012  . Acute lumbar back pain 07/12/2012  . Asymptomatic bacteriuria 07/12/2012  . 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 Benign neoplasm of meninges ([place]) 02/12/2012  . Brain mass 01/28/2012  . Chest pain with moderate risk of acute coronary syndrome 01/28/2012  . Rheumatoid arthritis ([place]) 01/28/2012  . Primary osteoarthritis  of right knee 01/28/2012  . Dyslipidemia   . Thyroid disease   . Essential hypertension     Past Surgical History:  Procedure Laterality Date  . ABDOMINAL HYSTERECTOMY    . BRAIN SURGERY     Gamma knife 10/13. Needs repeat spring  '14  . CARDIAC CATHETERIZATION N/A 10/31/2014   Procedure: Right/Left Heart Cath and Coronary Angiography;  Surgeon: [name], MD;  Location: [facility];  Service: Cardiovascular;  Laterality: N/A;  . ESOPHAGOGASTRODUODENOSCOPY (EGD) WITH PROPOFOL N/A 09/14/2014   Procedure: ESOPHAGOGASTRODUODENOSCOPY (EGD) WITH PROPOFOL;  Surgeon: [name], MD;  Location: [facility];  Service: Endoscopy;  Laterality: N/A;  . OVARY SURGERY    . SHOULDER SURGERY Left   . TONSILLECTOMY  age 32  . VIDEO BRONCHOSCOPY Bilateral 05/31/2013   Procedure: VIDEO BRONCHOSCOPY WITHOUT FLUORO;  Surgeon: Brand Males, MD;  Location: [facility];  Service: Cardiopulmonary;  Laterality: Bilateral;  . video bronscoscopy  2000   lung    OB History    No data available       Home Medications    Prior to Admission medications   Medication Sig Start Date End Date Taking? Authorizing Provider  ACCU-CHEK SMARTVIEW test strip 1 each by Other route every morning.  03/18/14  Yes Historical Provider, MD  acetaminophen (TYLENOL) 500 MG tablet 2 by mouth twice daily for mild pain, 2 by mouth twice daily as needed for moderate painTakes with tramadol   Yes Historical Provider, MD  albuterol (PROVENTIL HFA;VENTOLIN HFA) 108 (90 BASE) MCG/ACT inhaler Inhale 2 puffs into the lungs every 6 (six) hours as needed for wheezing or shortness of breath.   Yes Historical Provider, MD  allopurinol (ZYLOPRIM) 100 MG tablet Take 2 tablets (200 mg total) by mouth daily. 06/21/14  Yes Meredith Staggers, MD  aspirin EC 81 MG tablet Take 81 mg by mouth every morning.    Yes Historical Provider, MD  atorvastatin (LIPITOR) 20 MG tablet Take 10 mg by mouth every evening.    Yes Historical  Provider, MD  azithromycin (ZITHROMAX) 500 MG tablet Take 1 tablet by mouth daily. TAKE ON MON, WED & FRI 09/20/15  Yes Historical Provider, MD  Calcium Carb-Cholecalciferol (CALCIUM + D3 PO) Take 2 tablets by mouth daily with lunch.    Yes Historical Provider, MD  cholecalciferol (VITAMIN D) 1000 UNITS tablet Take 2,000 Units by mouth daily with lunch.    Yes Historical Provider, MD  colchicine 0.6 MG tablet Take 0.6 mg by mouth daily. 12/10/15 02/08/16 Yes Historical Provider, MD  diclofenac sodium (VOLTAREN) 1 % GEL Apply 1 application topically 3 (three) times daily. 03/22/15  Yes Meredith Staggers, MD  DULoxetine (CYMBALTA) 60 MG capsule Take 1 capsule (60 mg total) by mouth daily. 11/15/14  Yes Meredith Staggers, MD  famotidine (PEPCID) 20 MG tablet Take 1 tablet (20 mg total) by mouth at bedtime. 11/02/14  Yes Luke K Kilroy, PA-C  fluticasone (FLONASE) 50 MCG/ACT nasal spray Place 2 sprays into both nostrils  daily.   Yes Historical Provider, MD  furosemide (LASIX) 40 MG tablet Take 1 tablet (40 mg total) by mouth 2 (two) times daily. 01/03/16  Yes Luke K Kilroy, PA-C  guaiFENesin (MUCINEX) 600 MG 12 hr tablet Take 1,200 mg by mouth 2 (two) times daily.   Yes Historical Provider, MD  levothyroxine (SYNTHROID, LEVOTHROID) 50 MCG tablet Take 1 tablet (50 mcg total) by mouth daily. 07/23/12  Yes Ivan Anchors Love, PA-C  metoprolol succinate (TOPROL-XL) 100 MG 24 hr tablet Take 100 mg by mouth every morning. Take with or immediately following a meal.   Yes Historical Provider, MD  morphine (MS CONTIN) 15 MG 12 hr tablet Take 1 tablet (15 mg total) by mouth every 12 (twelve) hours. 11/30/15  Yes [name], NP  morphine (MSIR) 15 MG tablet Take 1/2 to 1 tablet by mouth every 6 hours as needed for pain. 09/24/15  Yes Meredith Staggers, MD  Multiple Vitamin (MULTIVITAMIN WITH MINERALS) TABS Take 1 tablet by mouth daily with lunch.    Yes Historical Provider, MD  olmesartan-hydrochlorothiazide (BENICAR HCT) 40-25 MG  tablet Take 1 tablet by mouth daily.  05/04/14  Yes Historical Provider, MD  OXYGEN-HELIUM IN Inhale 2 L into the lungs at bedtime. And on exertion   Yes Historical Provider, MD  pantoprazole (PROTONIX) 40 MG tablet take 1 tablet by mouth once daily MINUTES BEFORE 1ST MEAL OF THE DAY 11/15/15  Yes Brand Males, MD  predniSONE (DELTASONE) 10 MG tablet Take 10 mg by mouth every morning. 06/20/15  Yes Historical Provider, MD  pregabalin (LYRICA) 100 MG capsule Take 1 capsule (100 mg total) by mouth 3 (three) times daily. 09/24/15  Yes Meredith Staggers, MD  sodium chloride (OCEAN) 0.65 % SOLN nasal spray Place 1 spray into both nostrils as needed for congestion.   Yes Historical Provider, MD  Spacer/Aero-Holding Chambers (AEROCHAMBER MV) inhaler Use as instructed 06/10/13  Yes Tammy S Parrett, NP  VICTOZA 18 MG/3ML SOPN Inject 1.8 mg as directed daily.  01/26/15  Yes Historical Provider, MD  cephALEXin (KEFLEX) 500 MG capsule Take 1 capsule (500 mg total) by mouth 4 (four) times daily. 01/03/16   Okey Regal, PA-C  sulfamethoxazole-trimethoprim (BACTRIM DS,SEPTRA DS) 800-160 MG tablet Take 1 tablet by mouth 2 (two) times daily. 01/03/16 01/10/16  Okey Regal, PA-C    Family History Family History  Problem Relation Age of Onset  . Diabetes Mother   . Heart attack Mother   . Hypertension Father   . Lung cancer Father   . Diabetes Sister   . Diabetes Brother   . Hypertension Brother   . Heart disease Brother   . Heart attack Brother   . Kidney cancer Brother   . Uterine cancer Daughter   . Breast cancer Sister   . Rheum arthritis Maternal Uncle   . Gout Brother   . Kidney failure Brother     x 5    Social History Social History  Substance Use Topics  . Smoking status: Never Smoker  . Smokeless tobacco: Never Used  . Alcohol use No     Allergies   Codeine; Remicade [infliximab]; and Zestril [lisinopril]   Review of Systems Review of Systems  Constitutional: Negative for fever.    Gastrointestinal: Negative for nausea and vomiting.  Skin: Positive for wound.       Redness surrounding laceration.  All other systems reviewed and are negative.    Physical Exam Updated Vital Signs BP 110/69 (BP  Location: Left Arm)   Pulse 87   Temp 98.1 F (36.7 C) (Oral)   Ht _0  (1.626 m)   Wt 129.7 kg   SpO2 97%   BMI 49.09 kg/m   Physical Exam  Constitutional: She is oriented to person, place, and time. She appears well-developed and well-nourished. No distress.  HENT:  Head: Normocephalic and atraumatic.  Eyes: Conjunctivae are normal. Right eye exhibits no discharge. Left eye exhibits no discharge. No scleral icterus.  Cardiovascular: Normal rate.   Pulmonary/Chest: Effort normal.  Neurological: She is alert and oriented to person, place, and time. Coordination normal.  Skin: Skin is warm and dry. No rash noted. She is not diaphoretic. No erythema. No pallor.  Laceration with sutures in place, delayed wound healing noted. Surrounding redness to the site of trauma, warmth to touch on the right side of the laceration, no warmth to touch to the remainder of the leg, no discharge  Psychiatric: She has a normal mood and affect. Her behavior is normal.  Nursing note and vitals reviewed.    ED Treatments / Results  DIAGNOSTIC STUDIES:  Oxygen Saturation is 97% on room air, normal by my interpretation.    COORDINATION OF CARE:  4:34 PM Did not remove sutures today. Discussed treatment plan with pt at bedside, which include prescription of Keflex and Bactrim, and pt agreed to plan. Advised to see PCP in 3 days.  Labs (all labs ordered are listed, but only abnormal results are displayed) Labs Reviewed - No data to display  EKG  EKG Interpretation None       Radiology No results found.  Procedures Procedures (including critical care time)  Medications Ordered in ED Medications - No data to display   Initial Impression / Assessment and Plan / ED Course   I have reviewed the triage vital signs and the nursing notes.  Pertinent labs & imaging results that were available during my care of the patient were reviewed by me and considered in my medical decision making (see chart for details).  Clinical Course    Labs:   Imaging:   Consults:   Therapeutics:   Discharge Meds: Bactrim and Keflex for 7 days.  Assessment/Plan:  75 year old female presents today for suture removal. Patient has very small amount of cellulitis surrounding the wound. The wound is still approximated, but does not appear to be completely healed. Patient does have a very small amount of cellulitis surrounding the wound. She was initially started on Keflex to prevent infection as she is a diabetic. With cellulitic type picture patient will need Keflex 4 times daily with the addition of Bactrim to cover MRSA. Patient will need close monitoring, she is encouraged follow-up to primary care provider in 2-3 days, or the emergency room if she is unable to be seen. Patient has no signs of systemic illness, reassuring vital signs, afebrile. Patient given strict return precautions, she verbalized understanding and agreement today's plan had no further questions or concerns at time of discharge.   Final Clinical Impressions(s) / ED Diagnoses   Final diagnoses:  Cellulitis of left lower extremity  Suture check    New Prescriptions Discharge Medication List as of 01/03/2016  4:49 PM    START taking these medications   Details  sulfamethoxazole-trimethoprim (BACTRIM DS,SEPTRA DS) 800-160 MG tablet Take 1 tablet by mouth 2 (two) times daily., Starting Thu 01/03/2016, Until Thu 01/10/2016, Print       I personally performed the services described in this documentation, which  was scribed in my presence. The recorded information has been reviewed and is accurate.    Okey Regal, PA-C 01/03/16 1740    Charlesetta Shanks, MD 02/05/16 [phone]

## 2016-01-03 NOTE — ED Triage Notes (Addendum)
Pt is here for suture removal of her left shin. Pt had an accident last Thursday and told to come to the Er today.Left side of lower leg dressed with 4X4's and with kling.

## 2016-01-05 ENCOUNTER — Encounter (HOSPITAL_COMMUNITY): Payer: Self-pay | Admitting: Emergency Medicine

## 2016-01-05 ENCOUNTER — Emergency Department (HOSPITAL_COMMUNITY)
Admission: EM | Admit: 2016-01-05 | Discharge: 2016-01-05 | Disposition: A | Discharge status: Home / Self Care | Payer: Medicare Other | Attending: Emergency Medicine | Admitting: Emergency Medicine

## 2016-01-05 DIAGNOSIS — I5033 Acute on chronic diastolic (congestive) heart failure: Secondary | ICD-10-CM | POA: Insufficient documentation

## 2016-01-05 DIAGNOSIS — Z4802 Encounter for removal of sutures: Secondary | ICD-10-CM | POA: Diagnosis not present

## 2016-01-05 DIAGNOSIS — Z7982 Long term (current) use of aspirin: Secondary | ICD-10-CM | POA: Insufficient documentation

## 2016-01-05 DIAGNOSIS — Z7952 Long term (current) use of systemic steroids: Secondary | ICD-10-CM | POA: Diagnosis not present

## 2016-01-05 DIAGNOSIS — Z791 Long term (current) use of non-steroidal anti-inflammatories (NSAID): Secondary | ICD-10-CM | POA: Insufficient documentation

## 2016-01-05 DIAGNOSIS — W228XXD Striking against or struck by other objects, subsequent encounter: Secondary | ICD-10-CM | POA: Diagnosis not present

## 2016-01-05 DIAGNOSIS — N189 Chronic kidney disease, unspecified: Secondary | ICD-10-CM | POA: Insufficient documentation

## 2016-01-05 DIAGNOSIS — T148XXD Other injury of unspecified body region, subsequent encounter: Secondary | ICD-10-CM

## 2016-01-05 DIAGNOSIS — I130 Hypertensive heart and chronic kidney disease with heart failure and stage 1 through stage 4 chronic kidney disease, or unspecified chronic kidney disease: Secondary | ICD-10-CM | POA: Diagnosis not present

## 2016-01-05 DIAGNOSIS — E1122 Type 2 diabetes mellitus with diabetic chronic kidney disease: Secondary | ICD-10-CM | POA: Diagnosis not present

## 2016-01-05 DIAGNOSIS — Z79899 Other long term (current) drug therapy: Secondary | ICD-10-CM | POA: Insufficient documentation

## 2016-01-05 DIAGNOSIS — Z792 Long term (current) use of antibiotics: Secondary | ICD-10-CM | POA: Diagnosis not present

## 2016-01-05 DIAGNOSIS — J449 Chronic obstructive pulmonary disease, unspecified: Secondary | ICD-10-CM | POA: Insufficient documentation

## 2016-01-05 DIAGNOSIS — J45909 Unspecified asthma, uncomplicated: Secondary | ICD-10-CM | POA: Diagnosis not present

## 2016-01-05 DIAGNOSIS — S81812D Laceration without foreign body, left lower leg, subsequent encounter: Secondary | ICD-10-CM | POA: Insufficient documentation

## 2016-01-05 NOTE — ED Triage Notes (Signed)
Pt here for suture removal of sutures to L LE. She was told to come back today for suture removal. She is taking abx. States she is still having some drainage from wound. Rates pain 3/10. She denies fever.

## 2016-01-05 NOTE — ED Provider Notes (Signed)
[facility] DEPT Provider Note   CSN: [id number] Arrival date & time: 01/05/16  [place]     History   Chief Complaint Chief Complaint  Patient presents with  . Suture / Staple Removal    HPI   [name] is an 75 y.o. female with history of DM, CKD, COPD, pulmonary fibrosis, who presents to the ED for wound check and possible suture removal. She was originally seen on 8/10 after kicking the car door by accident and was found to have a 12cm laceration to her left shin which was repaired with sutures and steristrips. She came back on 8/17 for wound check and suture removal. The wound appeared to be healing slowly with some developing cellulitis despite completing initial keflex therapy. She was discharged home with keflex and bactrim and instructed to return in another 2-3 days for wound check. She states since her appt on 8/17 she feels the redness has improved. She states she had a bruised area where the skin peeled off on its own. She does note there continues to be some scant serosanguinous drainage and the area is tender to palpation. Denies fever or chills. In addition to her above listed medical conditions it should be noted that she is on chronic prednisone therapy.  Past Medical History:  Diagnosis Date  . Arthritis    endstage changes bilateral knees/bilateral ankles.   . Asthma   . Carotid artery occlusion   . Chronic kidney disease   . Clotting disorder ([place])    pt denies this  . Contusion of left knee    due to fall 1/14.  [name] COPD (chronic obstructive pulmonary disease) (HCC)    pulmonary fibrosis  . Depression, reactive   . Diabetes mellitus   . Diastolic dysfunction   . Family history of heart disease   . High cholesterol   . Hypertension   . Interstitial lung disease ([place])   . 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    . Morbid obesity ([place])   . Normal coronary  arteries    cardiac catheterization performed  10/31/14  . RA (rheumatoid arthritis) ([place])    has been off methotreaxte since 10/13.  [name] Shortness of breath   . Thyroid disease   . URI (upper respiratory infection)     Patient Active Problem List   Diagnosis Date Noted  . Tendinitis of left rotator cuff 06/13/2015  . Acute on chronic diastolic heart failure ([place]) 04/21/2015  . Acute pulmonary edema (HCC)   . AKI (acute kidney injury) ([place])   . Peripheral edema   . Acute on chronic respiratory failure with hypoxia ([place]) 04/17/2015  . CAP (community acquired pneumonia) 04/17/2015  . Acute kidney injury superimposed on chronic kidney disease ([place]) 04/17/2015  . Hypotension 04/17/2015  . Chronic respiratory failure with hypoxia ([place]) 02/09/2015  . Dyspnea and respiratory abnormality 01/03/2015  . Normal coronary arteries 11/02/2014  . Morbid obesity ([place]) 10/30/2014  . Dysphagia 08/21/2014  . Sore throat 07/18/2014  . Chronic fatigue 06/21/2014  . DOE (dyspnea on exertion) 05/24/2014  . Primary osteoarthritis of left knee 04/26/2014  . High risk medication use 04/17/2014  . Aphasia 02/12/2014  . 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 Lumbar facet arthropathy 01/18/2014  . Acute bronchitis 07/24/2013  . Bilateral edema of lower extremity 05/30/2013  . Chronic cough 05/24/2013  . ILD (interstitial lung disease) ([place]) 04/10/2013  .  Greater trochanteric bursitis of left hip 08/13/2012  . Spinal stenosis of lumbar region with radiculopathy 07/12/2012  . Inability to walk 07/12/2012  . Acute lumbar back pain 07/12/2012  . Asymptomatic bacteriuria 07/12/2012  . 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 Benign neoplasm of meninges ([place]) 02/12/2012  . Brain mass 01/28/2012  . Chest pain with moderate risk of acute coronary syndrome 01/28/2012  . Rheumatoid arthritis ([place]) 01/28/2012  . Primary osteoarthritis of right knee  01/28/2012  . Dyslipidemia   . Thyroid disease   . Essential hypertension     Past Surgical History:  Procedure Laterality Date  . ABDOMINAL HYSTERECTOMY    . BRAIN SURGERY     Gamma knife 10/13. Needs repeat spring  '14  . CARDIAC CATHETERIZATION N/A 10/31/2014   Procedure: Right/Left Heart Cath and Coronary Angiography;  Surgeon: [name], MD;  Location: [facility];  Service: Cardiovascular;  Laterality: N/A;  . ESOPHAGOGASTRODUODENOSCOPY (EGD) WITH PROPOFOL N/A 09/14/2014   Procedure: ESOPHAGOGASTRODUODENOSCOPY (EGD) WITH PROPOFOL;  Surgeon: [name], MD;  Location: [facility];  Service: Endoscopy;  Laterality: N/A;  . OVARY SURGERY    . SHOULDER SURGERY Left   . TONSILLECTOMY  age 21  . VIDEO BRONCHOSCOPY Bilateral 05/31/2013   Procedure: VIDEO BRONCHOSCOPY WITHOUT FLUORO;  Surgeon: Brand Males, MD;  Location: [facility];  Service: Cardiopulmonary;  Laterality: Bilateral;  . video bronscoscopy  2000   lung    OB History    No data available       Home Medications    Prior to Admission medications   Medication Sig Start Date End Date Taking? Authorizing Provider  ACCU-CHEK SMARTVIEW test strip 1 each by Other route every morning.  03/18/14   Historical Provider, MD  acetaminophen (TYLENOL) 500 MG tablet 2 by mouth twice daily for mild pain, 2 by mouth twice daily as needed for moderate painTakes with tramadol    Historical Provider, MD  albuterol (PROVENTIL HFA;VENTOLIN HFA) 108 (90 BASE) MCG/ACT inhaler Inhale 2 puffs into the lungs every 6 (six) hours as needed for wheezing or shortness of breath.    Historical Provider, MD  allopurinol (ZYLOPRIM) 100 MG tablet Take 2 tablets (200 mg total) by mouth daily. 06/21/14   Meredith Staggers, MD  aspirin EC 81 MG tablet Take 81 mg by mouth every morning.     Historical Provider, MD  atorvastatin (LIPITOR) 20 MG tablet Take 10 mg by mouth every evening.     Historical Provider, MD  azithromycin  (ZITHROMAX) 500 MG tablet Take 1 tablet by mouth daily. TAKE ON MON, WED & FRI 09/20/15   Historical Provider, MD  Calcium Carb-Cholecalciferol (CALCIUM + D3 PO) Take 2 tablets by mouth daily with lunch.     Historical Provider, MD  cephALEXin (KEFLEX) 500 MG capsule Take 1 capsule (500 mg total) by mouth 4 (four) times daily. 01/03/16   Okey Regal, PA-C  cholecalciferol (VITAMIN D) 1000 UNITS tablet Take 2,000 Units by mouth daily with lunch.     Historical Provider, MD  colchicine 0.6 MG tablet Take 0.6 mg by mouth daily. 12/10/15 02/08/16  Historical Provider, MD  diclofenac sodium (VOLTAREN) 1 % GEL Apply 1 application topically 3 (three) times daily. 03/22/15   Meredith Staggers, MD  DULoxetine (CYMBALTA) 60 MG capsule Take 1 capsule (60 mg total) by mouth daily. 11/15/14   Meredith Staggers, MD  famotidine (PEPCID) 20 MG tablet Take 1 tablet (20 mg  total) by mouth at bedtime. 11/02/14   Erlene Quan, PA-C  fluticasone (FLONASE) 50 MCG/ACT nasal spray Place 2 sprays into both nostrils daily.    Historical Provider, MD  furosemide (LASIX) 40 MG tablet Take 1 tablet (40 mg total) by mouth 2 (two) times daily. 01/03/16   Erlene Quan, PA-C  guaiFENesin (MUCINEX) 600 MG 12 hr tablet Take 1,200 mg by mouth 2 (two) times daily.    Historical Provider, MD  levothyroxine (SYNTHROID, LEVOTHROID) 50 MCG tablet Take 1 tablet (50 mcg total) by mouth daily. 07/23/12   Bary Leriche, PA-C  metoprolol succinate (TOPROL-XL) 100 MG 24 hr tablet Take 100 mg by mouth every morning. Take with or immediately following a meal.    Historical Provider, MD  morphine (MS CONTIN) 15 MG 12 hr tablet Take 1 tablet (15 mg total) by mouth every 12 (twelve) hours. 11/30/15   [name], NP  morphine (MSIR) 15 MG tablet Take 1/2 to 1 tablet by mouth every 6 hours as needed for pain. 09/24/15   Meredith Staggers, MD  Multiple Vitamin (MULTIVITAMIN WITH MINERALS) TABS Take 1 tablet by mouth daily with lunch.     Historical Provider, MD    olmesartan-hydrochlorothiazide (BENICAR HCT) 40-25 MG tablet Take 1 tablet by mouth daily.  05/04/14   Historical Provider, MD  OXYGEN-HELIUM IN Inhale 2 L into the lungs at bedtime. And on exertion    Historical Provider, MD  pantoprazole (PROTONIX) 40 MG tablet take 1 tablet by mouth once daily MINUTES BEFORE 1ST MEAL OF THE DAY 11/15/15   Brand Males, MD  predniSONE (DELTASONE) 10 MG tablet Take 10 mg by mouth every morning. 06/20/15   Historical Provider, MD  pregabalin (LYRICA) 100 MG capsule Take 1 capsule (100 mg total) by mouth 3 (three) times daily. 09/24/15   Meredith Staggers, MD  sodium chloride (OCEAN) 0.65 % SOLN nasal spray Place 1 spray into both nostrils as needed for congestion.    Historical Provider, MD  Spacer/Aero-Holding Chambers (AEROCHAMBER MV) inhaler Use as instructed 06/10/13   Tammy S Parrett, NP  sulfamethoxazole-trimethoprim (BACTRIM DS,SEPTRA DS) 800-160 MG tablet Take 1 tablet by mouth 2 (two) times daily. 01/03/16 01/10/16  Jeffrey Hedges, PA-C  VICTOZA 18 MG/3ML SOPN Inject 1.8 mg as directed daily.  01/26/15   Historical Provider, MD    Family History Family History  Problem Relation Age of Onset  . Diabetes Mother   . Heart attack Mother   . Hypertension Father   . Lung cancer Father   . Diabetes Sister   . Diabetes Brother   . Hypertension Brother   . Heart disease Brother   . Heart attack Brother   . Kidney cancer Brother   . Uterine cancer Daughter   . Breast cancer Sister   . Rheum arthritis Maternal Uncle   . Gout Brother   . Kidney failure Brother     x 5    Social History Social History  Substance Use Topics  . Smoking status: Never Smoker  . Smokeless tobacco: Never Used  . Alcohol use No     Allergies   Codeine; Remicade [infliximab]; and Zestril [lisinopril]   Review of Systems Review of Systems  All other systems reviewed and are negative.    Physical Exam Updated Vital Signs There were no vitals taken for this  visit.  Physical Exam  Constitutional: She is oriented to person, place, and time. No distress.  Obese, NAD  HENT:  Head: Atraumatic.  Right Ear: External ear normal.  Left Ear: External ear normal.  Nose: Nose normal.  Eyes: Conjunctivae are normal. No scleral icterus.  Cardiovascular: Normal rate and regular rhythm.   Pulmonary/Chest: Effort normal. No respiratory distress.  Abdominal: She exhibits no distension.  Neurological: She is alert and oriented to person, place, and time.  Skin: Skin is warm and dry. She is not diaphoretic.  Left shin with 12cm L-shaped laceration. There is a 3 cm area just lateral to the laceration where skin has peeled away and exposes some ecchymotic blood. The laceration itself appears to be healing fairly well however continues to have some areas of delayed healing, some scabbed and granulated areas. Sutures are in place. There continues to be surrounding erythema that pt states is improved and less stark than when she was seen two days ago  Psychiatric: She has a normal mood and affect. Her behavior is normal.  Nursing note and vitals reviewed.    ED Treatments / Results  Labs (all labs ordered are listed, but only abnormal results are displayed) Labs Reviewed - No data to display  EKG  EKG Interpretation None       Radiology No results found.  Procedures Procedures (including critical care time)  SUTURE REMOVAL Performed by: Delrae Rend  Consent: Verbal consent obtained. Patient identity confirmed: provided demographic data Time out: Immediately prior to procedure a "time out" was called to verify the correct patient, procedure, equipment, support staff and site/side marked as required.  Location details: left shin  Wound Appearance: clean  Sutures/Staples Removed: 2  Facility: sutures placed in this facility Patient tolerance: Patient tolerated the procedure well with no immediate complications.     Medications Ordered in  ED Medications - No data to display   Initial Impression / Assessment and Plan / ED Course  I have reviewed the triage vital signs and the nursing notes.  Pertinent labs & imaging results that were available during my care of the patient were reviewed by me and considered in my medical decision making (see chart for details).  Clinical Course   Pt evaluated by attending physician Dr. Kathrynn Humble as well. Agree that wound continues to heal slowly. We will plan to remove select sutures and assess wound integrity but ultimately most of the sutures need to be left in for longer.   After removal of just two sutures clear to see wound is not healed enough for removal of any more sutures at this time. Instructed pt to return in two more days to re-check. In the meantime I re-dressed wound with xeroform and sterile dressing. Instructed to continue keflex and bactrim. ER return precautions given.  Final Clinical Impressions(s) / ED Diagnoses   Final diagnoses:  Encounter for removal of sutures  Delayed wound healing    New Prescriptions Discharge Medication List as of 01/05/2016  7:45 PM        Anne Ng, PA-C 01/05/16 2022    Varney Biles, MD 01/06/16 1416

## 2016-01-05 NOTE — Discharge Instructions (Signed)
Continue your antibiotics as prescribed. Return to the ER in two days for a re-check and we will assess how your laceration is healing then. We only removed two sutures today. Please also call your primary care provider to schedule a follow up appointment as soon as possible. Return to the ER sooner for new or worsening symptoms.

## 2016-01-05 NOTE — ED Notes (Signed)
MD at bedside. Dr. Kathrynn Humble at bedside.

## 2016-01-07 ENCOUNTER — Emergency Department (HOSPITAL_COMMUNITY)
Admission: EM | Admit: 2016-01-07 | Discharge: 2016-01-07 | Disposition: A | Discharge status: Home / Self Care | Payer: Medicare Other | Attending: Physician Assistant | Admitting: Physician Assistant

## 2016-01-07 ENCOUNTER — Encounter (HOSPITAL_COMMUNITY): Payer: Self-pay

## 2016-01-07 DIAGNOSIS — N189 Chronic kidney disease, unspecified: Secondary | ICD-10-CM | POA: Diagnosis not present

## 2016-01-07 DIAGNOSIS — E1122 Type 2 diabetes mellitus with diabetic chronic kidney disease: Secondary | ICD-10-CM | POA: Insufficient documentation

## 2016-01-07 DIAGNOSIS — J449 Chronic obstructive pulmonary disease, unspecified: Secondary | ICD-10-CM | POA: Diagnosis not present

## 2016-01-07 DIAGNOSIS — Z791 Long term (current) use of non-steroidal anti-inflammatories (NSAID): Secondary | ICD-10-CM | POA: Diagnosis not present

## 2016-01-07 DIAGNOSIS — Z79899 Other long term (current) drug therapy: Secondary | ICD-10-CM | POA: Diagnosis not present

## 2016-01-07 DIAGNOSIS — Z4802 Encounter for removal of sutures: Secondary | ICD-10-CM | POA: Diagnosis not present

## 2016-01-07 DIAGNOSIS — I130 Hypertensive heart and chronic kidney disease with heart failure and stage 1 through stage 4 chronic kidney disease, or unspecified chronic kidney disease: Secondary | ICD-10-CM | POA: Diagnosis not present

## 2016-01-07 DIAGNOSIS — Z7982 Long term (current) use of aspirin: Secondary | ICD-10-CM | POA: Diagnosis not present

## 2016-01-07 DIAGNOSIS — I509 Heart failure, unspecified: Secondary | ICD-10-CM | POA: Diagnosis not present

## 2016-01-07 DIAGNOSIS — J45909 Unspecified asthma, uncomplicated: Secondary | ICD-10-CM | POA: Diagnosis not present

## 2016-01-07 DIAGNOSIS — Z5189 Encounter for other specified aftercare: Secondary | ICD-10-CM

## 2016-01-07 DIAGNOSIS — Z4800 Encounter for change or removal of nonsurgical wound dressing: Secondary | ICD-10-CM | POA: Diagnosis not present

## 2016-01-07 NOTE — Discharge Instructions (Signed)
Return here as needed.  Follow-up with your primary care doctor

## 2016-01-07 NOTE — ED Notes (Signed)
PT DISCHARGED. INSTRUCTIONS GIVEN. AAOX4. PT IN NO APPARENT DISTRESS OR PAIN. THE OPPORTUNITY TO ASK QUESTIONS WAS PROVIDED.

## 2016-01-07 NOTE — ED Triage Notes (Signed)
Pt had sutures in leg.  Pt went to have them removed by [place] 2 days ago and only 2 were able to remove d/t cellulitis.  Here for wound recheck and possible suture removal.  Wound is improving.

## 2016-01-09 ENCOUNTER — Ambulatory Visit: Payer: Medicare Other | Admitting: Registered Nurse

## 2016-01-09 NOTE — ED Provider Notes (Signed)
[facility] DEPT [facility] Provider Note   CSN: [id number] Arrival date & time: 01/07/16  1632     History   Chief Complaint Chief Complaint  Patient presents with  . Wound Check  . Suture / Staple Removal    HPI [name] is a 75 y.o. female.  HPI Patient presents to the emergency department for recheck of laceration.  The patient was seen twice since having sutures placed and placed on antibiotics due to possible cellulitis sutures were left in place for further healing.  Patient is now presenting for reevaluation of this area and states she has not had any fever, nausea, vomiting, weakness, dizziness, numbness, near syncope or syncope.  The patient states that the area does appear to be vastly improving versus the original visit for cellulitis.  The patient states she has had no drainage from the area at this time Past Medical History:  Diagnosis Date  . Arthritis    endstage changes bilateral knees/bilateral ankles.   . Asthma   . Carotid artery occlusion   . Chronic kidney disease   . Clotting disorder ([place])    pt denies this  . Contusion of left knee    due to fall 1/14.  [name] COPD (chronic obstructive pulmonary disease) (HCC)    pulmonary fibrosis  . Depression, reactive   . Diabetes mellitus   . Diastolic dysfunction   . Family history of heart disease   . High cholesterol   . Hypertension   . Interstitial lung disease ([place])   . 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    . Morbid obesity ([place])   . Normal coronary arteries    cardiac catheterization performed  10/31/14  . RA (rheumatoid arthritis) ([place])    has been off methotreaxte since 10/13.  [name] Shortness of breath   . Thyroid disease   . URI (upper respiratory infection)     Patient Active Problem List   Diagnosis Date Noted  . Tendinitis of left rotator cuff 06/13/2015  . Acute on chronic diastolic heart  failure ([place]) 04/21/2015  . Acute pulmonary edema (HCC)   . AKI (acute kidney injury) ([place])   . Peripheral edema   . Acute on chronic respiratory failure with hypoxia ([place]) 04/17/2015  . CAP (community acquired pneumonia) 04/17/2015  . Acute kidney injury superimposed on chronic kidney disease ([place]) 04/17/2015  . Hypotension 04/17/2015  . Chronic respiratory failure with hypoxia ([place]) 02/09/2015  . Dyspnea and respiratory abnormality 01/03/2015  . Normal coronary arteries 11/02/2014  . Morbid obesity ([place]) 10/30/2014  . Dysphagia 08/21/2014  . Sore throat 07/18/2014  . Chronic fatigue 06/21/2014  . DOE (dyspnea on exertion) 05/24/2014  . Primary osteoarthritis of left knee 04/26/2014  . High risk medication use 04/17/2014  . Aphasia 02/12/2014  . 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 ([place]) 01/18/2014  . Lumbar facet arthropathy 01/18/2014  . Acute bronchitis 07/24/2013  . Bilateral edema of lower extremity 05/30/2013  . Chronic cough 05/24/2013  . ILD (interstitial lung disease) ([place]) 04/10/2013  . Greater trochanteric bursitis of left hip 08/13/2012  . Spinal stenosis of lumbar region with radiculopathy 07/12/2012  . Inability to walk 07/12/2012  . Acute lumbar back pain 07/12/2012  . Asymptomatic bacteriuria 07/12/2012  . 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 Benign neoplasm of meninges ([place]) 02/12/2012  . Brain mass 01/28/2012  . Chest pain with  moderate risk of acute coronary syndrome 01/28/2012  . Rheumatoid arthritis ([place]) 01/28/2012  . Primary osteoarthritis of right knee 01/28/2012  . Dyslipidemia   . Thyroid disease   . Essential hypertension     Past Surgical History:  Procedure Laterality Date  . ABDOMINAL HYSTERECTOMY    . BRAIN SURGERY     Gamma knife 10/13. Needs repeat spring  '14  . CARDIAC CATHETERIZATION N/A 10/31/2014   Procedure: Right/Left Heart Cath and Coronary Angiography;  Surgeon: [name], MD;  Location: [facility];  Service: Cardiovascular;  Laterality: N/A;  . ESOPHAGOGASTRODUODENOSCOPY (EGD) WITH PROPOFOL N/A 09/14/2014   Procedure: ESOPHAGOGASTRODUODENOSCOPY (EGD) WITH PROPOFOL;  Surgeon: [name], MD;  Location: [facility];  Service: Endoscopy;  Laterality: N/A;  . OVARY SURGERY    . SHOULDER SURGERY Left   . TONSILLECTOMY  age 24  . VIDEO BRONCHOSCOPY Bilateral 05/31/2013   Procedure: VIDEO BRONCHOSCOPY WITHOUT FLUORO;  Surgeon: Brand Males, MD;  Location: [facility];  Service: Cardiopulmonary;  Laterality: Bilateral;  . video bronscoscopy  2000   lung    OB History    No data available       Home Medications    Prior to Admission medications   Medication Sig Start Date End Date Taking? Authorizing Provider  ACCU-CHEK SMARTVIEW test strip 1 each by Other route every morning.  03/18/14   Historical Provider, MD  acetaminophen (TYLENOL) 500 MG tablet 2 by mouth twice daily for mild pain, 2 by mouth twice daily as needed for moderate painTakes with tramadol    Historical Provider, MD  albuterol (PROVENTIL HFA;VENTOLIN HFA) 108 (90 BASE) MCG/ACT inhaler Inhale 2 puffs into the lungs every 6 (six) hours as needed for wheezing or shortness of breath.    Historical Provider, MD  allopurinol (ZYLOPRIM) 100 MG tablet Take 2 tablets (200 mg total) by mouth daily. 06/21/14   Meredith Staggers, MD  aspirin EC 81 MG tablet Take 81 mg by mouth every morning.     Historical Provider, MD  atorvastatin (LIPITOR) 20 MG tablet Take 10 mg by mouth every evening.     Historical Provider, MD  azithromycin (ZITHROMAX) 500 MG tablet Take 1 tablet by mouth daily. TAKE ON MON, WED & FRI 09/20/15   Historical Provider, MD  Calcium Carb-Cholecalciferol (CALCIUM + D3 PO) Take 2 tablets by mouth daily with lunch.     Historical Provider, MD  cephALEXin (KEFLEX) 500 MG capsule Take 1 capsule (500 mg total) by mouth 4 (four) times daily. 01/03/16   Okey Regal, PA-C    cholecalciferol (VITAMIN D) 1000 UNITS tablet Take 2,000 Units by mouth daily with lunch.     Historical Provider, MD  colchicine 0.6 MG tablet Take 0.6 mg by mouth daily. 12/10/15 02/08/16  Historical Provider, MD  diclofenac sodium (VOLTAREN) 1 % GEL Apply 1 application topically 3 (three) times daily. 03/22/15   Meredith Staggers, MD  DULoxetine (CYMBALTA) 60 MG capsule Take 1 capsule (60 mg total) by mouth daily. 11/15/14   Meredith Staggers, MD  famotidine (PEPCID) 20 MG tablet Take 1 tablet (20 mg total) by mouth at bedtime. 11/02/14   Erlene Quan, PA-C  fluticasone (FLONASE) 50 MCG/ACT nasal spray Place 2 sprays into both nostrils daily.    Historical Provider, MD  furosemide (LASIX) 40 MG tablet Take 1 tablet (40 mg total) by mouth 2 (two) times daily. 01/03/16   Erlene Quan, PA-C  guaiFENesin (MUCINEX) 600 MG 12  hr tablet Take 1,200 mg by mouth 2 (two) times daily.    Historical Provider, MD  levothyroxine (SYNTHROID, LEVOTHROID) 50 MCG tablet Take 1 tablet (50 mcg total) by mouth daily. 07/23/12   Bary Leriche, PA-C  metoprolol succinate (TOPROL-XL) 100 MG 24 hr tablet Take 100 mg by mouth every morning. Take with or immediately following a meal.    Historical Provider, MD  morphine (MS CONTIN) 15 MG 12 hr tablet Take 1 tablet (15 mg total) by mouth every 12 (twelve) hours. 11/30/15   [name], NP  morphine (MSIR) 15 MG tablet Take 1/2 to 1 tablet by mouth every 6 hours as needed for pain. 09/24/15   Meredith Staggers, MD  Multiple Vitamin (MULTIVITAMIN WITH MINERALS) TABS Take 1 tablet by mouth daily with lunch.     Historical Provider, MD  olmesartan-hydrochlorothiazide (BENICAR HCT) 40-25 MG tablet Take 1 tablet by mouth daily.  05/04/14   Historical Provider, MD  OXYGEN-HELIUM IN Inhale 2 L into the lungs at bedtime. And on exertion    Historical Provider, MD  pantoprazole (PROTONIX) 40 MG tablet take 1 tablet by mouth once daily MINUTES BEFORE 1ST MEAL OF THE DAY 11/15/15   Brand Males, MD  predniSONE (DELTASONE) 10 MG tablet Take 10 mg by mouth every morning. 06/20/15   Historical Provider, MD  pregabalin (LYRICA) 100 MG capsule Take 1 capsule (100 mg total) by mouth 3 (three) times daily. 09/24/15   Meredith Staggers, MD  sodium chloride (OCEAN) 0.65 % SOLN nasal spray Place 1 spray into both nostrils as needed for congestion.    Historical Provider, MD  Spacer/Aero-Holding Chambers (AEROCHAMBER MV) inhaler Use as instructed 06/10/13   Tammy S Parrett, NP  sulfamethoxazole-trimethoprim (BACTRIM DS,SEPTRA DS) 800-160 MG tablet Take 1 tablet by mouth 2 (two) times daily. 01/03/16 01/10/16  Jeffrey Hedges, PA-C  VICTOZA 18 MG/3ML SOPN Inject 1.8 mg as directed daily.  01/26/15   Historical Provider, MD    Family History Family History  Problem Relation Age of Onset  . Diabetes Mother   . Heart attack Mother   . Hypertension Father   . Lung cancer Father   . Diabetes Sister   . Diabetes Brother   . Hypertension Brother   . Heart disease Brother   . Heart attack Brother   . Kidney cancer Brother   . Uterine cancer Daughter   . Breast cancer Sister   . Rheum arthritis Maternal Uncle   . Gout Brother   . Kidney failure Brother     x 5    Social History Social History  Substance Use Topics  . Smoking status: Never Smoker  . Smokeless tobacco: Never Used  . Alcohol use No     Allergies   Codeine; Remicade [infliximab]; and Zestril [lisinopril]   Review of Systems Review of Systems All other systems negative except as documented in the HPI. All pertinent positives and negatives as reviewed in the HPI.  Physical Exam Updated Vital Signs BP 119/68 (BP Location: Left Arm)   Pulse 84   Temp 98.4 F (36.9 C) (Oral)   Resp 20   Ht _0  (1.626 m)   Wt 129.7 kg   SpO2 95%   BMI 49.09 kg/m   Physical Exam  Constitutional: She is oriented to person, place, and time. She appears well-developed and well-nourished.  HENT:  Head: Normocephalic and  atraumatic.  Neurological: She is alert and oriented to person, place, and time.  Skin:        ED Treatments / Results  Labs (all labs ordered are listed, but only abnormal results are displayed) Labs Reviewed - No data to display  EKG  EKG Interpretation None       Radiology No results found.  Procedures Procedures (including critical care time)  Medications Ordered in ED Medications - No data to display   Initial Impression / Assessment and Plan / ED Course  I have reviewed the triage vital signs and the nursing notes.  Pertinent labs & imaging results that were available during my care of the patient were reviewed by me and considered in my medical decision making (see chart for details).  Clinical Course    Sutures were removed and Steri-Strips were put in place due to permit further wound healing.  The wound did separate but it was separating with the sutures in place.  I did advise the patient follow-up with her primary doctor and she may need referral to the wound care center due to the fact she has diabetes, is on chronic steroids.  Patient agrees the plan and all questions were answered  Final Clinical Impressions(s) / ED Diagnoses   Final diagnoses:  Visit for wound check  Visit for suture removal    New Prescriptions Discharge Medication List as of 01/07/2016  9:01 PM       Dalia Heading, PA-C 01/09/16 [place], MD 01/12/16 [phone]

## 2016-01-11 ENCOUNTER — Emergency Department (HOSPITAL_COMMUNITY)
Admission: EM | Admit: 2016-01-11 | Discharge: 2016-01-11 | Disposition: A | Discharge status: Home / Self Care | Payer: Medicare Other | Attending: Emergency Medicine | Admitting: Emergency Medicine

## 2016-01-11 ENCOUNTER — Encounter (HOSPITAL_COMMUNITY): Payer: Self-pay | Admitting: *Deleted

## 2016-01-11 DIAGNOSIS — I5033 Acute on chronic diastolic (congestive) heart failure: Secondary | ICD-10-CM | POA: Insufficient documentation

## 2016-01-11 DIAGNOSIS — Z5189 Encounter for other specified aftercare: Secondary | ICD-10-CM

## 2016-01-11 DIAGNOSIS — J45909 Unspecified asthma, uncomplicated: Secondary | ICD-10-CM | POA: Insufficient documentation

## 2016-01-11 DIAGNOSIS — N189 Chronic kidney disease, unspecified: Secondary | ICD-10-CM | POA: Insufficient documentation

## 2016-01-11 DIAGNOSIS — Z09 Encounter for follow-up examination after completed treatment for conditions other than malignant neoplasm: Secondary | ICD-10-CM | POA: Diagnosis not present

## 2016-01-11 DIAGNOSIS — J449 Chronic obstructive pulmonary disease, unspecified: Secondary | ICD-10-CM | POA: Diagnosis not present

## 2016-01-11 DIAGNOSIS — Z79899 Other long term (current) drug therapy: Secondary | ICD-10-CM | POA: Insufficient documentation

## 2016-01-11 DIAGNOSIS — E1122 Type 2 diabetes mellitus with diabetic chronic kidney disease: Secondary | ICD-10-CM | POA: Insufficient documentation

## 2016-01-11 DIAGNOSIS — S81812D Laceration without foreign body, left lower leg, subsequent encounter: Secondary | ICD-10-CM | POA: Diagnosis not present

## 2016-01-11 DIAGNOSIS — Z86011 Personal history of benign neoplasm of the brain: Secondary | ICD-10-CM | POA: Diagnosis not present

## 2016-01-11 DIAGNOSIS — I130 Hypertensive heart and chronic kidney disease with heart failure and stage 1 through stage 4 chronic kidney disease, or unspecified chronic kidney disease: Secondary | ICD-10-CM | POA: Insufficient documentation

## 2016-01-11 DIAGNOSIS — W228XXD Striking against or struck by other objects, subsequent encounter: Secondary | ICD-10-CM | POA: Diagnosis not present

## 2016-01-11 DIAGNOSIS — Z4801 Encounter for change or removal of surgical wound dressing: Secondary | ICD-10-CM | POA: Insufficient documentation

## 2016-01-11 DIAGNOSIS — Z7982 Long term (current) use of aspirin: Secondary | ICD-10-CM | POA: Diagnosis not present

## 2016-01-11 NOTE — ED Provider Notes (Signed)
[facility] DEPT Provider Note   CSN: [id number] Arrival date & time: 01/11/16  2004  By signing my name below, I, Reola Mosher, attest that this documentation has been prepared under the direction and in the presence of Shary Decamp, PA-C.  Electronically Signed: Reola Mosher, ED Scribe. 01/11/16. 9:07 PM.  History   Chief Complaint Chief Complaint  Patient presents with  . Wound Check   Patient gave verbal permission to utilize photo for medical documentation only The image was not stored on any personal device  The history is provided by the patient. No language interpreter was used.   HPI Comments: [name] is a 75 y.o. female with a PMHx significant of DM2, interstitial lung disease and RA (on Prednisone), who presents to the Emergency Department for a wound check s/p 12cm laceration to her left calf that occurred ~15 days ago that was repaired w/ steristrips and sutures. She has been seen for similar wound checks in the ED since the incident, had her sutures removed on 01/07/16, and has been placed on several abx therapies due to poor healing since initial encounter. She reports that she placed Neosporin on the site today PTA, and saw that it was opening, prompting her to come into the ED. She notes associated bloody drainage, 3/10 pain, and blackening skin surrounding the area. She states that her pain to the area is exacerbated with ambulation and palpation, and describes it as shooting. Pt is currently on a course of Keflex for three more days, and was on a course of Bactrim which she has finished. She has been compliant with all abx therapies. Pt has been seen by her PCP earlier today, who has plans to place her into a wound care center. She notes that they currently do not have a specific timeframe for her to get into wound care. Denies fever, or any other associated symptoms.   Past Medical History:  Diagnosis Date  . Arthritis    endstage changes  bilateral knees/bilateral ankles.   . Asthma   . Carotid artery occlusion   . Chronic kidney disease   . Clotting disorder ([place])    pt denies this  . Contusion of left knee    due to fall 1/14.  [name] COPD (chronic obstructive pulmonary disease) (HCC)    pulmonary fibrosis  . Depression, reactive   . Diabetes mellitus   . Diastolic dysfunction   . Family history of heart disease   . High cholesterol   . Hypertension   . Interstitial lung disease ([place])   . 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    . Morbid obesity ([place])   . Normal coronary arteries    cardiac catheterization performed  10/31/14  . RA (rheumatoid arthritis) ([place])    has been off methotreaxte since 10/13.  [name] Shortness of breath   . Thyroid disease   . URI (upper respiratory infection)    Patient Active Problem List   Diagnosis Date Noted  . Tendinitis of left rotator cuff 06/13/2015  . Acute on chronic diastolic heart failure ([place]) 04/21/2015  . Acute pulmonary edema (HCC)   . AKI (acute kidney injury) ([place])   . Peripheral edema   . Acute on chronic respiratory failure with hypoxia ([place]) 04/17/2015  . CAP (community acquired pneumonia) 04/17/2015  . Acute kidney injury superimposed on chronic kidney disease ([place]) 04/17/2015  . Hypotension 04/17/2015  .  Chronic respiratory failure with hypoxia ([place]) 02/09/2015  . Dyspnea and respiratory abnormality 01/03/2015  . Normal coronary arteries 11/02/2014  . Morbid obesity ([place]) 10/30/2014  . Dysphagia 08/21/2014  . Sore throat 07/18/2014  . Chronic fatigue 06/21/2014  . DOE (dyspnea on exertion) 05/24/2014  . Primary osteoarthritis of left knee 04/26/2014  . High risk medication use 04/17/2014  . Aphasia 02/12/2014  . 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 Lumbar facet arthropathy 01/18/2014  . Acute bronchitis 07/24/2013  . Bilateral edema of  lower extremity 05/30/2013  . Chronic cough 05/24/2013  . ILD (interstitial lung disease) ([place]) 04/10/2013  . Greater trochanteric bursitis of left hip 08/13/2012  . Spinal stenosis of lumbar region with radiculopathy 07/12/2012  . Inability to walk 07/12/2012  . Acute lumbar back pain 07/12/2012  . Asymptomatic bacteriuria 07/12/2012  . 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 Benign neoplasm of meninges ([place]) 02/12/2012  . Brain mass 01/28/2012  . Chest pain with moderate risk of acute coronary syndrome 01/28/2012  . Rheumatoid arthritis ([place]) 01/28/2012  . Primary osteoarthritis of right knee 01/28/2012  . Dyslipidemia   . Thyroid disease   . Essential hypertension     Past Surgical History:  Procedure Laterality Date  . ABDOMINAL HYSTERECTOMY    . BRAIN SURGERY     Gamma knife 10/13. Needs repeat spring  '14  . CARDIAC CATHETERIZATION N/A 10/31/2014   Procedure: Right/Left Heart Cath and Coronary Angiography;  Surgeon: [name], MD;  Location: [facility];  Service: Cardiovascular;  Laterality: N/A;  . ESOPHAGOGASTRODUODENOSCOPY (EGD) WITH PROPOFOL N/A 09/14/2014   Procedure: ESOPHAGOGASTRODUODENOSCOPY (EGD) WITH PROPOFOL;  Surgeon: [name], MD;  Location: [facility];  Service: Endoscopy;  Laterality: N/A;  . OVARY SURGERY    . SHOULDER SURGERY Left   . TONSILLECTOMY  age 60  . VIDEO BRONCHOSCOPY Bilateral 05/31/2013   Procedure: VIDEO BRONCHOSCOPY WITHOUT FLUORO;  Surgeon: Brand Males, MD;  Location: [facility];  Service: Cardiopulmonary;  Laterality: Bilateral;  . video bronscoscopy  2000   lung   OB History    No data available     Home Medications    Prior to Admission medications   Medication Sig Start Date End Date Taking? Authorizing Provider  acetaminophen (TYLENOL) 500 MG tablet Take 1,000 mg by mouth every 6 (six) hours as needed for moderate pain.    Yes Historical Provider, MD  albuterol  (PROVENTIL HFA;VENTOLIN HFA) 108 (90 BASE) MCG/ACT inhaler Inhale 2 puffs into the lungs every 6 (six) hours as needed for wheezing or shortness of breath.   Yes Historical Provider, MD  allopurinol (ZYLOPRIM) 100 MG tablet Take 2 tablets (200 mg total) by mouth daily. 06/21/14  Yes Meredith Staggers, MD  aspirin EC 81 MG tablet Take 81 mg by mouth every morning.    Yes Historical Provider, MD  atorvastatin (LIPITOR) 20 MG tablet Take 10 mg by mouth every evening.    Yes Historical Provider, MD  azithromycin (ZITHROMAX) 500 MG tablet Take 1 tablet by mouth daily. TAKE ON MON, WED & FRI 09/20/15  Yes Historical Provider, MD  Calcium Carb-Cholecalciferol (CALCIUM + D3 PO) Take 2 tablets by mouth daily with lunch.    Yes Historical Provider, MD  cephALEXin (KEFLEX) 500 MG capsule Take 1 capsule (500 mg total) by mouth 4 (four) times daily. 01/03/16  Yes Jeffrey Hedges, PA-C  cholecalciferol (VITAMIN D) 1000 UNITS tablet Take 2,000 Units  by mouth daily with lunch.    Yes Historical Provider, MD  colchicine 0.6 MG tablet Take 0.6 mg by mouth daily. 12/10/15 02/08/16 Yes Historical Provider, MD  diclofenac sodium (VOLTAREN) 1 % GEL Apply 1 application topically 3 (three) times daily. 03/22/15  Yes Meredith Staggers, MD  DULoxetine (CYMBALTA) 60 MG capsule Take 1 capsule (60 mg total) by mouth daily. 11/15/14  Yes Meredith Staggers, MD  famotidine (PEPCID) 20 MG tablet Take 1 tablet (20 mg total) by mouth at bedtime. 11/02/14  Yes Luke K Kilroy, PA-C  fluticasone (FLONASE) 50 MCG/ACT nasal spray Place 2 sprays into both nostrils daily.   Yes Historical Provider, MD  furosemide (LASIX) 40 MG tablet Take 1 tablet (40 mg total) by mouth 2 (two) times daily. Patient taking differently: Take 40 mg by mouth daily.  01/03/16  Yes Luke K Kilroy, PA-C  guaiFENesin (MUCINEX) 600 MG 12 hr tablet Take 1,200 mg by mouth 2 (two) times daily.   Yes Historical Provider, MD  levothyroxine (SYNTHROID, LEVOTHROID) 50 MCG tablet Take 1  tablet (50 mcg total) by mouth daily. 07/23/12  Yes Ivan Anchors Love, PA-C  metoprolol succinate (TOPROL-XL) 100 MG 24 hr tablet Take 100 mg by mouth every morning. Take with or immediately following a meal.   Yes Historical Provider, MD  morphine (MS CONTIN) 15 MG 12 hr tablet Take 1 tablet (15 mg total) by mouth every 12 (twelve) hours. 11/30/15  Yes [name], NP  morphine (MSIR) 15 MG tablet Take 1/2 to 1 tablet by mouth every 6 hours as needed for pain. 09/24/15  Yes Meredith Staggers, MD  Multiple Vitamin (MULTIVITAMIN WITH MINERALS) TABS Take 1 tablet by mouth daily with lunch.    Yes Historical Provider, MD  olmesartan-hydrochlorothiazide (BENICAR HCT) 40-25 MG tablet Take 1 tablet by mouth daily.  05/04/14  Yes Historical Provider, MD  OXYGEN-HELIUM IN Inhale 2 L into the lungs at bedtime. And on exertion   Yes Historical Provider, MD  pantoprazole (PROTONIX) 40 MG tablet take 1 tablet by mouth once daily MINUTES BEFORE 1ST MEAL OF THE DAY 11/15/15  Yes Brand Males, MD  predniSONE (DELTASONE) 10 MG tablet Take 10 mg by mouth every morning. 06/20/15  Yes Historical Provider, MD  pregabalin (LYRICA) 100 MG capsule Take 1 capsule (100 mg total) by mouth 3 (three) times daily. 09/24/15  Yes Meredith Staggers, MD  sodium chloride (OCEAN) 0.65 % SOLN nasal spray Place 1 spray into both nostrils as needed for congestion.   Yes Historical Provider, MD  VICTOZA 18 MG/3ML SOPN Inject 1.8 mg as directed daily.  01/26/15  Yes Historical Provider, MD  ACCU-CHEK SMARTVIEW test strip 1 each by Other route every morning.  03/18/14   Historical Provider, MD  Spacer/Aero-Holding Chambers (AEROCHAMBER MV) inhaler Use as instructed 06/10/13   Melvenia Needles, NP   Family History Family History  Problem Relation Age of Onset  . Diabetes Mother   . Heart attack Mother   . Hypertension Father   . Lung cancer Father   . Diabetes Sister   . Diabetes Brother   . Hypertension Brother   . Heart disease Brother   .  Heart attack Brother   . Kidney cancer Brother   . Uterine cancer Daughter   . Breast cancer Sister   . Rheum arthritis Maternal Uncle   . Gout Brother   . Kidney failure Brother     x 5   Social History Social  History  Substance Use Topics  . Smoking status: Never Smoker  . Smokeless tobacco: Never Used  . Alcohol use No   Allergies   Codeine; Remicade [infliximab]; and Zestril [lisinopril]  Review of Systems Review of Systems A complete 10 system review of systems was obtained and all systems are negative except as noted in the HPI and PMH.   Physical Exam Updated Vital Signs BP 131/80 (BP Location: Left Arm)   Pulse 81   Temp 97.9 F (36.6 C) (Oral)   Resp 18   Ht _0  (1.626 m)   Wt 286 lb (129.7 kg)   SpO2 96%   BMI 49.09 kg/m   Physical Exam  Constitutional: She appears well-developed and well-nourished.  HENT:  Head: Normocephalic.  Eyes: Conjunctivae are normal.  Cardiovascular: Normal rate.   Pulmonary/Chest: Effort normal. No respiratory distress.  Abdominal: She exhibits no distension.  Musculoskeletal: Normal range of motion.  Neurological: She is alert.  Skin: Skin is warm and dry. There is erythema.  See picture. NV intact. Surrounding erythema to the area, but no purulence in the wound.   Psychiatric: She has a normal mood and affect. Her behavior is normal.  Nursing note and vitals reviewed.    ED Treatments / Results  DIAGNOSTIC STUDIES: Oxygen Saturation is 96% on RA, normal by my interpretation.   COORDINATION OF CARE: 9:07 PM-Discussed next steps with pt. Pt verbalized understanding and is agreeable with the plan.   Labs (all labs ordered are listed, but only abnormal results are displayed) Labs Reviewed - No data to display  EKG  EKG Interpretation None      Radiology No results found.  Procedures Procedures (including critical care time)  Medications Ordered in ED Medications - No data to display  Initial  Impression / Assessment and Plan / ED Course  I have reviewed the triage vital signs and the nursing notes.  Pertinent labs & imaging results that were available during my care of the patient were reviewed by me and considered in my medical decision making (see chart for details).  Clinical Course   Final Clinical Impressions(s) / ED Diagnoses  I have reviewed the relevant previous healthcare records. I obtained HPI from historian. Patient discussed with supervising physician  ED Course:  Assessment: Patient returns for check of wound. Pt saw PCP today for same. Concerned for widening laceration. Pt currently on ABX and scheduled to see wound care clinic. The region appears to be well-healing and infection appears to be resolving. Patient symptoms improved from prior visit. Afebrile and hemodynamically stable. Placed steri strips on wound. Bandaged. Pt is instructed to continue with home care or antibiotics. Pt has a good understanding of return precautions and is safe for discharge at this time.  Disposition/Plan:  DC Home Additional Verbal discharge instructions given and discussed with patient.  Pt Instructed to f/u with PCP in the next week for evaluation and treatment of symptoms. Return precautions given Pt acknowledges and agrees with plan  Supervising Physician Lacretia Leigh, MD   Final diagnoses:  Visit for wound check    New Prescriptions New Prescriptions   No medications on file    I personally performed the services described in this documentation, which was scribed in my presence. The recorded information has been reviewed and is accurate.    Shary Decamp, PA-C 01/11/16 2124    Lacretia Leigh, MD 01/13/16 [phone]

## 2016-01-11 NOTE — Discharge Instructions (Signed)
Please read and follow all provided instructions.  Your diagnoses today include:  1. Visit for wound check     Tests performed today include: Vital signs. See below for your results today.   Medications prescribed:  Take as prescribed   Home care instructions:  Follow any educational materials contained in this packet.  Follow-up instructions: Please follow-up with your primary care provider for further evaluation of symptoms and treatment   Return instructions:  Please return to the Emergency Department if you do not get better, if you get worse, or new symptoms OR  - Fever (temperature greater than 101.74F)  - Bleeding that does not stop with holding pressure to the area    -Severe pain (please note that you may be more sore the day after your accident)  - Chest Pain  - Difficulty breathing  - Severe nausea or vomiting  - Inability to tolerate food and liquids  - Passing out  - Skin becoming red around your wounds  - Change in mental status (confusion or lethargy)  - New numbness or weakness    Please return if you have any other emergent concerns.  Additional Information:  Your vital signs today were: BP 131/80 (BP Location: Left Arm)    Pulse 81    Temp 97.9 F (36.6 C) (Oral)    Resp 18    Ht _0  (1.626 m)    Wt 129.7 kg    SpO2 96%    BMI 49.09 kg/m  If your blood pressure (BP) was elevated above 135/85 this visit, please have this repeated by your doctor within one month. ---------------

## 2016-01-11 NOTE — ED Triage Notes (Signed)
Pt complains of delayed healing and opening up of sutured area. Pt had stitches removed on 8/21. Pt states she may have to go wound care clinic but has not heard back.

## 2016-01-15 ENCOUNTER — Ambulatory Visit (HOSPITAL_COMMUNITY)
Admission: EM | Admit: 2016-01-15 | Discharge: 2016-01-15 | Disposition: A | Discharge status: Home / Self Care | Payer: Medicare Other

## 2016-01-15 ENCOUNTER — Encounter (HOSPITAL_COMMUNITY): Payer: Self-pay | Admitting: Emergency Medicine

## 2016-01-15 DIAGNOSIS — L03116 Cellulitis of left lower limb: Secondary | ICD-10-CM | POA: Diagnosis not present

## 2016-01-15 DIAGNOSIS — Z5189 Encounter for other specified aftercare: Secondary | ICD-10-CM

## 2016-01-15 NOTE — ED Triage Notes (Signed)
The patient presented to the [facility] with a complaint of a wound to her left leg that has not healed since the initial laceration on 12/27/2015. The patient has had multiple trips to the ED as well as an appointment at the wound care center in later September but is not comfortably waiting that long.

## 2016-01-15 NOTE — ED Provider Notes (Signed)
CSN: [id number]     Arrival date & time 01/15/16  1243 History   None    Chief Complaint  Patient presents with  . Wound Check   (Consider location/radiation/quality/duration/timing/severity/associated sxs/prior Treatment) Patient has wound on her left lower extremity from laceration wound that dehisced and turned into cellulitis.  She just finished keflex abx's.  She has appointment that was made by her PCP in September to see wound clinic but it  Is a month away.   The history is provided by the patient.  Wound Check  This is a new problem. The current episode started more than 1 week ago. The problem occurs constantly. The problem has not changed since onset.The symptoms are aggravated by walking. Nothing relieves the symptoms. She has tried nothing for the symptoms.    Past Medical History:  Diagnosis Date  . Arthritis    endstage changes bilateral knees/bilateral ankles.   . Asthma   . Carotid artery occlusion   . Chronic kidney disease   . Clotting disorder ([place])    pt denies this  . Contusion of left knee    due to fall 1/14.  [name] COPD (chronic obstructive pulmonary disease) (HCC)    pulmonary fibrosis  . Depression, reactive   . Diabetes mellitus   . Diastolic dysfunction   . Family history of heart disease   . High cholesterol   . Hypertension   . Interstitial lung disease ([place])   . 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    . Morbid obesity ([place])   . Normal coronary arteries    cardiac catheterization performed  10/31/14  . RA (rheumatoid arthritis) ([place])    has been off methotreaxte since 10/13.  [name] Shortness of breath   . Thyroid disease   . URI (upper respiratory infection)    Past Surgical History:  Procedure Laterality Date  . ABDOMINAL HYSTERECTOMY    . BRAIN SURGERY     Gamma knife 10/13. Needs repeat spring  '14  . CARDIAC CATHETERIZATION N/A 10/31/2014   Procedure: Right/Left Heart Cath and Coronary Angiography;  Surgeon: [name], MD;  Location: [facility];  Service: Cardiovascular;  Laterality: N/A;  . ESOPHAGOGASTRODUODENOSCOPY (EGD) WITH PROPOFOL N/A 09/14/2014   Procedure: ESOPHAGOGASTRODUODENOSCOPY (EGD) WITH PROPOFOL;  Surgeon: [name], MD;  Location: [facility];  Service: Endoscopy;  Laterality: N/A;  . OVARY SURGERY    . SHOULDER SURGERY Left   . TONSILLECTOMY  age 23  . VIDEO BRONCHOSCOPY Bilateral 05/31/2013   Procedure: VIDEO BRONCHOSCOPY WITHOUT FLUORO;  Surgeon: Brand Males, MD;  Location: [facility];  Service: Cardiopulmonary;  Laterality: Bilateral;  . video bronscoscopy  2000   lung   Family History  Problem Relation Age of Onset  . Diabetes Mother   . Heart attack Mother   . Hypertension Father   . Lung cancer Father   . Diabetes Sister   . Diabetes Brother   . Hypertension Brother   . Heart disease Brother   . Heart attack Brother   . Kidney cancer Brother   . Uterine cancer Daughter   . Breast cancer Sister   . Rheum arthritis Maternal Uncle   . Gout Brother   . Kidney failure Brother     x 5   Social History  Substance Use Topics  . Smoking status: Never Smoker  . Smokeless tobacco: Never Used  . Alcohol use No  OB History    No data available     Review of Systems  Constitutional: Negative.   HENT: Negative.   Eyes: Negative.   Respiratory: Negative.   Cardiovascular: Negative.   Gastrointestinal: Negative.   Endocrine: Negative.   Genitourinary: Negative.   Musculoskeletal: Positive for joint swelling.  Skin: Negative.   Neurological: Negative.   Psychiatric/Behavioral: Negative.     Allergies  Codeine; Remicade [infliximab]; and Zestril [lisinopril]  Home Medications   Prior to Admission medications   Medication Sig Start Date End Date Taking? Authorizing Provider  ACCU-CHEK SMARTVIEW test strip 1 each by Other route every morning.  03/18/14    Historical Provider, MD  acetaminophen (TYLENOL) 500 MG tablet Take 1,000 mg by mouth every 6 (six) hours as needed for moderate pain.     Historical Provider, MD  albuterol (PROVENTIL HFA;VENTOLIN HFA) 108 (90 BASE) MCG/ACT inhaler Inhale 2 puffs into the lungs every 6 (six) hours as needed for wheezing or shortness of breath.    Historical Provider, MD  allopurinol (ZYLOPRIM) 100 MG tablet Take 2 tablets (200 mg total) by mouth daily. 06/21/14   Meredith Staggers, MD  aspirin EC 81 MG tablet Take 81 mg by mouth every morning.     Historical Provider, MD  atorvastatin (LIPITOR) 20 MG tablet Take 10 mg by mouth every evening.     Historical Provider, MD  azithromycin (ZITHROMAX) 500 MG tablet Take 1 tablet by mouth daily. TAKE ON MON, WED & FRI 09/20/15   Historical Provider, MD  Calcium Carb-Cholecalciferol (CALCIUM + D3 PO) Take 2 tablets by mouth daily with lunch.     Historical Provider, MD  cephALEXin (KEFLEX) 500 MG capsule Take 1 capsule (500 mg total) by mouth 4 (four) times daily. 01/03/16   Okey Regal, PA-C  cholecalciferol (VITAMIN D) 1000 UNITS tablet Take 2,000 Units by mouth daily with lunch.     Historical Provider, MD  colchicine 0.6 MG tablet Take 0.6 mg by mouth daily. 12/10/15 02/08/16  Historical Provider, MD  diclofenac sodium (VOLTAREN) 1 % GEL Apply 1 application topically 3 (three) times daily. 03/22/15   Meredith Staggers, MD  DULoxetine (CYMBALTA) 60 MG capsule Take 1 capsule (60 mg total) by mouth daily. 11/15/14   Meredith Staggers, MD  famotidine (PEPCID) 20 MG tablet Take 1 tablet (20 mg total) by mouth at bedtime. 11/02/14   Erlene Quan, PA-C  fluticasone (FLONASE) 50 MCG/ACT nasal spray Place 2 sprays into both nostrils daily.    Historical Provider, MD  furosemide (LASIX) 40 MG tablet Take 1 tablet (40 mg total) by mouth 2 (two) times daily. Patient taking differently: Take 40 mg by mouth daily.  01/03/16   Erlene Quan, PA-C  guaiFENesin (MUCINEX) 600 MG 12 hr tablet Take  1,200 mg by mouth 2 (two) times daily.    Historical Provider, MD  levothyroxine (SYNTHROID, LEVOTHROID) 50 MCG tablet Take 1 tablet (50 mcg total) by mouth daily. 07/23/12   Bary Leriche, PA-C  metoprolol succinate (TOPROL-XL) 100 MG 24 hr tablet Take 100 mg by mouth every morning. Take with or immediately following a meal.    Historical Provider, MD  morphine (MS CONTIN) 15 MG 12 hr tablet Take 1 tablet (15 mg total) by mouth every 12 (twelve) hours. 11/30/15   [name], NP  morphine (MSIR) 15 MG tablet Take 1/2 to 1 tablet by mouth every 6 hours as needed for pain. 09/24/15   Meredith Staggers,  MD  Multiple Vitamin (MULTIVITAMIN WITH MINERALS) TABS Take 1 tablet by mouth daily with lunch.     Historical Provider, MD  olmesartan-hydrochlorothiazide (BENICAR HCT) 40-25 MG tablet Take 1 tablet by mouth daily.  05/04/14   Historical Provider, MD  OXYGEN-HELIUM IN Inhale 2 L into the lungs at bedtime. And on exertion    Historical Provider, MD  pantoprazole (PROTONIX) 40 MG tablet take 1 tablet by mouth once daily MINUTES BEFORE 1ST MEAL OF THE DAY 11/15/15   Brand Males, MD  predniSONE (DELTASONE) 10 MG tablet Take 10 mg by mouth every morning. 06/20/15   Historical Provider, MD  pregabalin (LYRICA) 100 MG capsule Take 1 capsule (100 mg total) by mouth 3 (three) times daily. 09/24/15   Meredith Staggers, MD  sodium chloride (OCEAN) 0.65 % SOLN nasal spray Place 1 spray into both nostrils as needed for congestion.    Historical Provider, MD  Spacer/Aero-Holding Chambers (AEROCHAMBER MV) inhaler Use as instructed 06/10/13   Tammy S Parrett, NP  VICTOZA 18 MG/3ML SOPN Inject 1.8 mg as directed daily.  01/26/15   Historical Provider, MD   Meds Ordered and Administered this Visit  Medications - No data to display  BP 105/72 (BP Location: Left Wrist)   Pulse 101   Temp 97.6 F (36.4 C) (Oral)   Resp 20   SpO2 95%  No data found.   Physical Exam  Constitutional: She appears well-developed and  well-nourished.  HENT:  Head: Normocephalic and atraumatic.  Eyes: Conjunctivae and EOM are normal. Pupils are equal, round, and reactive to light.  Neck: Normal range of motion. Neck supple.  Cardiovascular: Normal rate, regular rhythm and normal heart sounds.   Pulmonary/Chest: Effort normal and breath sounds normal.  Abdominal: Soft. Bowel sounds are normal.  Skin: There is erythema.  Left lower extremity with dehiscence of laceration and cellulitis with erythema surrounding wound.  The steri strips are removed and silvadene ointment is applied.  Nursing note and vitals reviewed.   Urgent Care Course   Clinical Course    Procedures (including critical care time)  Labs Review Labs Reviewed - No data to display  Imaging Review No results found.   Visual Acuity Review  Right Eye Distance:   Left Eye Distance:   Bilateral Distance:    Right Eye Near:   Left Eye Near:    Bilateral Near:         MDM  Left leg cellulitis - Silvadene ointment applied to left lower leg wound and telfa pad applied and then coban bandage applied.  Patient advised to follow up tomorrow for wound care and dressing change.     Lysbeth Penner, FNP 01/15/16 1348

## 2016-01-15 NOTE — Discharge Instructions (Signed)
Follow up tomorrow for wound care

## 2016-01-16 ENCOUNTER — Encounter (HOSPITAL_COMMUNITY): Payer: Self-pay | Admitting: Emergency Medicine

## 2016-01-16 ENCOUNTER — Encounter: Payer: Medicare Other | Attending: Physical Medicine & Rehabilitation | Admitting: Registered Nurse

## 2016-01-16 ENCOUNTER — Ambulatory Visit (HOSPITAL_COMMUNITY)
Admission: EM | Admit: 2016-01-16 | Discharge: 2016-01-16 | Disposition: A | Discharge status: Home / Self Care | Payer: Medicare Other

## 2016-01-16 ENCOUNTER — Telehealth: Payer: Self-pay | Admitting: Registered Nurse

## 2016-01-16 ENCOUNTER — Encounter: Payer: Self-pay | Admitting: Registered Nurse

## 2016-01-16 VITALS — BP 134/83 | HR 103 | Resp 17

## 2016-01-16 DIAGNOSIS — M1712 Unilateral primary osteoarthritis, left knee: Secondary | ICD-10-CM

## 2016-01-16 DIAGNOSIS — M7062 Trochanteric bursitis, left hip: Secondary | ICD-10-CM | POA: Diagnosis not present

## 2016-01-16 DIAGNOSIS — Z5181 Encounter for therapeutic drug level monitoring: Secondary | ICD-10-CM | POA: Diagnosis not present

## 2016-01-16 DIAGNOSIS — M4806 Spinal stenosis, lumbar region: Secondary | ICD-10-CM | POA: Diagnosis not present

## 2016-01-16 DIAGNOSIS — M47816 Spondylosis without myelopathy or radiculopathy, lumbar region: Secondary | ICD-10-CM

## 2016-01-16 DIAGNOSIS — Z5189 Encounter for other specified aftercare: Secondary | ICD-10-CM | POA: Diagnosis not present

## 2016-01-16 DIAGNOSIS — M069 Rheumatoid arthritis, unspecified: Secondary | ICD-10-CM | POA: Insufficient documentation

## 2016-01-16 DIAGNOSIS — M7582 Other shoulder lesions, left shoulder: Secondary | ICD-10-CM

## 2016-01-16 DIAGNOSIS — G894 Chronic pain syndrome: Secondary | ICD-10-CM | POA: Insufficient documentation

## 2016-01-16 DIAGNOSIS — L03116 Cellulitis of left lower limb: Secondary | ICD-10-CM | POA: Diagnosis not present

## 2016-01-16 DIAGNOSIS — M5416 Radiculopathy, lumbar region: Secondary | ICD-10-CM | POA: Insufficient documentation

## 2016-01-16 DIAGNOSIS — M1711 Unilateral primary osteoarthritis, right knee: Secondary | ICD-10-CM

## 2016-01-16 DIAGNOSIS — Z79899 Other long term (current) drug therapy: Secondary | ICD-10-CM | POA: Diagnosis not present

## 2016-01-16 DIAGNOSIS — E1140 Type 2 diabetes mellitus with diabetic neuropathy, unspecified: Secondary | ICD-10-CM

## 2016-01-16 MED ORDER — MORPHINE SULFATE ER 15 MG PO TBCR: 15.0000 mg | EXTENDED_RELEASE_TABLET | Freq: Two times a day (BID) | ORAL | 0 refills | Status: DC

## 2016-01-16 NOTE — ED Triage Notes (Signed)
Patient here today for a wound check of left lower leg.  Reports feeling slightly better since seen last.

## 2016-01-16 NOTE — Progress Notes (Signed)
Subjective:    Patient ID: [name], female    DOB: November 27, 1940, 75 y.o.   MRN: [id number]  HPI: [name] is a 75 year old female who returns for follow up appointment and medication refill. She states her pain is located in her lower back radiating into her left hip and left lower extremity posteriorly and bilateral knees. She rates her pain 5. Also states she has double vision occassionally we will taper her Lyrica off and will re institute Gabapentin, she verbalizes understanding. This was discuss with Dr. Naaman Plummer and he agrees with plan.  Her current exercise regime is performing stretching exercises and  walking in her home with cane or walker.  Also states on August 10th she was getting out of her [place] and bumped her leg against the car door, she went to [facility] ED. She has been going to urgent Care for Wound care she states.   Pain Inventory Average Pain 5 Pain Right Now 5 My pain is sharp, burning and stabbing  In the last 24 hours, has pain interfered with the following? General activity 4 Relation with others 4 Enjoyment of life 4 What TIME of day is your pain at its worst? morning Sleep (in general) Fair  Pain is worse with: walking, bending and some activites Pain improves with: rest, therapy/exercise and pacing activities Relief from Meds: 5  Mobility use a cane use a walker use a wheelchair  Function retired  Neuro/Psych bladder control problems dizziness  Prior Studies Any changes since last visit?  no  Physicians involved in your care Any changes since last visit?  no   Family History  Problem Relation Age of Onset  . Diabetes Mother   . Heart attack Mother   . Hypertension Father   . Lung cancer Father   . Diabetes Sister   . Diabetes Brother   . Hypertension Brother   . Heart disease Brother   . Heart attack Brother   . Kidney cancer Brother   . Uterine cancer Daughter   . Breast cancer Sister   . Rheum arthritis  Maternal Uncle   . Gout Brother   . Kidney failure Brother     x 5   Social History   Social History  . Marital status: Married    Spouse name: N/A  . Number of children: 6  . Years of education: college   Occupational History  . retired Marine scientist    Social History Main Topics  . Smoking status: Never Smoker  . Smokeless tobacco: Never Used  . Alcohol use No  . Drug use: No  . Sexual activity: Not Currently   Other Topics Concern  . None   Social History Narrative   Patient consumes 2-3 cups coffee per day.   Past Surgical History:  Procedure Laterality Date  . ABDOMINAL HYSTERECTOMY    . BRAIN SURGERY     Gamma knife 10/13. Needs repeat spring  '14  . CARDIAC CATHETERIZATION N/A 10/31/2014   Procedure: Right/Left Heart Cath and Coronary Angiography;  Surgeon: [name], MD;  Location: [facility];  Service: Cardiovascular;  Laterality: N/A;  . ESOPHAGOGASTRODUODENOSCOPY (EGD) WITH PROPOFOL N/A 09/14/2014   Procedure: ESOPHAGOGASTRODUODENOSCOPY (EGD) WITH PROPOFOL;  Surgeon: [name], MD;  Location: [facility];  Service: Endoscopy;  Laterality: N/A;  . OVARY SURGERY    . SHOULDER SURGERY Left   . TONSILLECTOMY  age 50  . VIDEO BRONCHOSCOPY Bilateral 05/31/2013   Procedure:  VIDEO BRONCHOSCOPY WITHOUT FLUORO;  Surgeon: Brand Males, MD;  Location: [facility];  Service: Cardiopulmonary;  Laterality: Bilateral;  . video bronscoscopy  2000   lung   Past Medical History:  Diagnosis Date  . Arthritis    endstage changes bilateral knees/bilateral ankles.   . Asthma   . Carotid artery occlusion   . Chronic kidney disease   . Clotting disorder ([place])    pt denies this  . Contusion of left knee    due to fall 1/14.  [name] COPD (chronic obstructive pulmonary disease) (HCC)    pulmonary fibrosis  . Depression, reactive   . Diabetes mellitus   . Diastolic dysfunction   . Family history of heart disease   . High cholesterol   . Hypertension   .  Interstitial lung disease ([place])   . 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    . Morbid obesity ([place])   . Normal coronary arteries    cardiac catheterization performed  10/31/14  . RA (rheumatoid arthritis) ([place])    has been off methotreaxte since 10/13.  [name] Shortness of breath   . Thyroid disease   . URI (upper respiratory infection)    There were no vitals taken for this visit.  Opioid Risk Score:   Fall Risk Score:  `1  Depression screen PHQ 2/9  Depression screen [facility] 2/9 08/09/2015 01/05/2015 09/20/2014 08/10/2013  Decreased Interest _0 -  Down, Depressed, Hopeless _1 PHQ - 2 Score _2 Altered sleeping - 2 0 -  Tired, decreased energy - 2 3 -  Change in appetite - 2 3 -  Feeling bad or failure about yourself  - 0 3 -  Trouble concentrating - 1 1 -  Moving slowly or fidgety/restless - 0 2 -  Suicidal thoughts - 0 0 -  PHQ-9 Score - 10 16 -  Difficult doing work/chores - Somewhat difficult - -  Some recent data might be hidden    Review of Systems  Constitutional:       Bladder control problems   Neurological: Positive for dizziness.  All other systems reviewed and are negative.      Objective:   Physical Exam  Constitutional: She is oriented to person, place, and time. She appears well-developed and well-nourished.  HENT:  Head: Normocephalic and atraumatic.  Neck: Normal range of motion. Neck supple.  Cardiovascular: Normal rate and regular rhythm.   Pulmonary/Chest: Effort normal and breath sounds normal.  Musculoskeletal:  Normal Muscle Bulk and Muscle Testing Reveals: Upper Extremities: Full ROM and Muscle Strength 5/5 Thoracic and Lumbar Hypersensitivity Left Greater Trochanteric Tenderness Lower Extremities: Full ROM and Muscle Strength 5/5 Arises from Table slowly using straight cane for support Narrow Based Gait   Neurological: She is alert and  oriented to person, place, and time.  Skin: Skin is warm and dry.  Psychiatric: She has a normal mood and affect.  Nursing note and vitals reviewed.         Assessment & Plan:  1. Lumbar spinal stenosis with neurogenic claudication. Associated facet arthropathy: Continue Lyrica and HEP as tolerated. Refilled : MS Contin 15 mg one tablet every 12 hours #60 and Continue MSIR 15 mg 1/2 to one tablet every 6 hours as needed.No script given MSIR. We will continue the opioid monitoring program, this consists of regular clinic visits, examinations, urine drug screen,  pill counts as well as use of [place] Controlled Substance reporting System. 2. Depression: Continue Cymbalta 3. Diabetes mellitus type 2 with polyneuropathy: Continue to Monitor 4. Rheumatoid arthritis and osteoarthritis. Contnue Voltaren Gel 5. Interstitial lung disease: Continuous Oxygen and Pulmonology Following.    30 minutes of face to face patient care time was spent during this visit. All questions were encouraged and answered.  F/U in 1 month

## 2016-01-16 NOTE — ED Provider Notes (Signed)
CSN: [id number]     Arrival date & time 01/16/16  1332 History   None    Chief Complaint  Patient presents with  . Wound Check   (Consider location/radiation/quality/duration/timing/severity/associated sxs/prior Treatment) Patient had laceration to left Lower Extremity and the wound dehisced and she has followed up for wound care/cellulitis of left lower extremity.  She is feeling better today.  She is awaiting a wound care appointment in 3 weeks.  She has finished several courses of abx's.  She has hx of LE edema. The wound clinic was called yesterday and unable to get patient an earlier appointment.     The history is provided by the patient.  Wound Check  This is a recurrent problem. The current episode started more than 1 week ago. The problem occurs constantly. The problem has been gradually improving. Nothing aggravates the symptoms. Nothing relieves the symptoms. She has tried nothing for the symptoms.    Past Medical History:  Diagnosis Date  . Arthritis    endstage changes bilateral knees/bilateral ankles.   . Asthma   . Carotid artery occlusion   . Chronic kidney disease   . Clotting disorder ([place])    pt denies this  . Contusion of left knee    due to fall 1/14.  [name] COPD (chronic obstructive pulmonary disease) (HCC)    pulmonary fibrosis  . Depression, reactive   . Diabetes mellitus   . Diastolic dysfunction   . Family history of heart disease   . High cholesterol   . Hypertension   . Interstitial lung disease ([place])   . 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    . Morbid obesity ([place])   . Normal coronary arteries    cardiac catheterization performed  10/31/14  . RA (rheumatoid arthritis) ([place])    has been off methotreaxte since 10/13.  [name] Shortness of breath   . Thyroid disease   . URI (upper respiratory infection)    Past Surgical History:  Procedure Laterality Date  .  ABDOMINAL HYSTERECTOMY    . BRAIN SURGERY     Gamma knife 10/13. Needs repeat spring  '14  . CARDIAC CATHETERIZATION N/A 10/31/2014   Procedure: Right/Left Heart Cath and Coronary Angiography;  Surgeon: [name], MD;  Location: [place] [facility];  Service: Cardiovascular;  Laterality: N/A;  . ESOPHAGOGASTRODUODENOSCOPY (EGD) WITH PROPOFOL N/A 09/14/2014   Procedure: ESOPHAGOGASTRODUODENOSCOPY (EGD) WITH PROPOFOL;  Surgeon: [name], MD;  Location: [facility];  Service: Endoscopy;  Laterality: N/A;  . OVARY SURGERY    . SHOULDER SURGERY Left   . TONSILLECTOMY  age 5  . VIDEO BRONCHOSCOPY Bilateral 05/31/2013   Procedure: VIDEO BRONCHOSCOPY WITHOUT FLUORO;  Surgeon: Brand Males, MD;  Location: [place];  Service: Cardiopulmonary;  Laterality: Bilateral;  . video bronscoscopy  2000   lung   Family History  Problem Relation Age of Onset  . Diabetes Mother   . Heart attack Mother   . Hypertension Father   . Lung cancer Father   . Diabetes Sister   . Diabetes Brother   . Hypertension Brother   . Heart disease Brother   . Heart attack Brother   . Kidney cancer Brother   . Uterine cancer Daughter   . Breast cancer Sister   . Rheum arthritis Maternal Uncle   . Gout Brother   . Kidney failure Brother     x 5  Social History  Substance Use Topics  . Smoking status: Never Smoker  . Smokeless tobacco: Never Used  . Alcohol use No   OB History    No data available     Review of Systems  Constitutional: Negative.   HENT: Negative.   Eyes: Negative.   Respiratory: Negative.   Cardiovascular: Negative.   Gastrointestinal: Negative.   Endocrine: Negative.   Genitourinary: Negative.   Musculoskeletal: Negative.   Skin: Positive for wound.  Allergic/Immunologic: Negative.   Neurological: Negative.   Hematological: Negative.   Psychiatric/Behavioral: Negative.     Allergies  Codeine; Remicade [infliximab]; and Zestril [lisinopril]  Home  Medications   Prior to Admission medications   Medication Sig Start Date End Date Taking? Authorizing Provider  ACCU-CHEK SMARTVIEW test strip 1 each by Other route every morning.  03/18/14   Historical Provider, MD  acetaminophen (TYLENOL) 500 MG tablet Take 1,000 mg by mouth every 6 (six) hours as needed for moderate pain.     Historical Provider, MD  albuterol (PROVENTIL HFA;VENTOLIN HFA) 108 (90 BASE) MCG/ACT inhaler Inhale 2 puffs into the lungs every 6 (six) hours as needed for wheezing or shortness of breath.    Historical Provider, MD  allopurinol (ZYLOPRIM) 100 MG tablet Take 2 tablets (200 mg total) by mouth daily. 06/21/14   Meredith Staggers, MD  aspirin EC 81 MG tablet Take 81 mg by mouth every morning.     Historical Provider, MD  atorvastatin (LIPITOR) 20 MG tablet Take 10 mg by mouth every evening.     Historical Provider, MD  azithromycin (ZITHROMAX) 500 MG tablet Take 1 tablet by mouth daily. TAKE ON MON, WED & FRI 09/20/15   Historical Provider, MD  Calcium Carb-Cholecalciferol (CALCIUM + D3 PO) Take 2 tablets by mouth daily with lunch.     Historical Provider, MD  cephALEXin (KEFLEX) 500 MG capsule Take 1 capsule (500 mg total) by mouth 4 (four) times daily. 01/03/16   Okey Regal, PA-C  cholecalciferol (VITAMIN D) 1000 UNITS tablet Take 2,000 Units by mouth daily with lunch.     Historical Provider, MD  colchicine 0.6 MG tablet Take 0.6 mg by mouth daily. 12/10/15 02/08/16  Historical Provider, MD  diclofenac sodium (VOLTAREN) 1 % GEL Apply 1 application topically 3 (three) times daily. 03/22/15   Meredith Staggers, MD  DULoxetine (CYMBALTA) 60 MG capsule Take 1 capsule (60 mg total) by mouth daily. 11/15/14   Meredith Staggers, MD  famotidine (PEPCID) 20 MG tablet Take 1 tablet (20 mg total) by mouth at bedtime. 11/02/14   Erlene Quan, PA-C  fluticasone (FLONASE) 50 MCG/ACT nasal spray Place 2 sprays into both nostrils daily.    Historical Provider, MD  furosemide (LASIX) 40 MG  tablet Take 1 tablet (40 mg total) by mouth 2 (two) times daily. Patient taking differently: Take 40 mg by mouth daily.  01/03/16   Erlene Quan, PA-C  guaiFENesin (MUCINEX) 600 MG 12 hr tablet Take 1,200 mg by mouth 2 (two) times daily.    Historical Provider, MD  levothyroxine (SYNTHROID, LEVOTHROID) 50 MCG tablet Take 1 tablet (50 mcg total) by mouth daily. 07/23/12   Bary Leriche, PA-C  metoprolol succinate (TOPROL-XL) 100 MG 24 hr tablet Take 100 mg by mouth every morning. Take with or immediately following a meal.    Historical Provider, MD  morphine (MS CONTIN) 15 MG 12 hr tablet Take 1 tablet (15 mg total) by mouth every 12 (twelve) hours. 01/16/16  [name], NP  morphine (MSIR) 15 MG tablet Take 1/2 to 1 tablet by mouth every 6 hours as needed for pain. 09/24/15   Meredith Staggers, MD  Multiple Vitamin (MULTIVITAMIN WITH MINERALS) TABS Take 1 tablet by mouth daily with lunch.     Historical Provider, MD  olmesartan-hydrochlorothiazide (BENICAR HCT) 40-25 MG tablet Take 1 tablet by mouth daily.  05/04/14   Historical Provider, MD  OXYGEN-HELIUM IN Inhale 2 L into the lungs at bedtime. And on exertion    Historical Provider, MD  pantoprazole (PROTONIX) 40 MG tablet take 1 tablet by mouth once daily MINUTES BEFORE 1ST MEAL OF THE DAY 11/15/15   Brand Males, MD  predniSONE (DELTASONE) 10 MG tablet Take 10 mg by mouth every morning. 06/20/15   Historical Provider, MD  sodium chloride (OCEAN) 0.65 % SOLN nasal spray Place 1 spray into both nostrils as needed for congestion.    Historical Provider, MD  Spacer/Aero-Holding Chambers (AEROCHAMBER MV) inhaler Use as instructed 06/10/13   Tammy S Parrett, NP  VICTOZA 18 MG/3ML SOPN Inject 1.8 mg as directed daily.  01/26/15   Historical Provider, MD   Meds Ordered and Administered this Visit  Medications - No data to display  BP 98/67 (BP Location: Left Arm)   Pulse 99   Temp 98 F (36.7 C) (Oral)   Resp 18   SpO2 99%  No data  found.   Physical Exam  Constitutional: She appears well-developed and well-nourished.  Eyes: EOM are normal. Pupils are equal, round, and reactive to light.  Cardiovascular: Normal rate, regular rhythm and normal heart sounds.   Pulmonary/Chest: Effort normal and breath sounds normal.  Musculoskeletal: She exhibits edema.  2 plus pitting edema left lower extremity and pretibial area and pedal edema.  Skin:  Left leg wound is better today and less erythematous. 12 cm laceration dehiscence wound without any purulence or drainage.  The wound edges are granulating and no eschar tissue noted.    Nursing note and vitals reviewed.   Urgent Care Course   Clinical Course    Procedures (including critical care time)  Labs Review Labs Reviewed - No data to display  Imaging Review No results found.   Visual Acuity Review  Right Eye Distance:   Left Eye Distance:   Bilateral Distance:    Right Eye Near:   Left Eye Near:    Bilateral Near:         MDM   1. Cellulitis of left lower extremity   2. Wound check, abscess    Silvadene cream applied to left leg wound and then 4x4 applied.  Consider changing to wet to dry dressing and or unnas boot tomorrow visit. Follow up for wound check tomorrow.     Lysbeth Penner, FNP 01/16/16 1416

## 2016-01-16 NOTE — Patient Instructions (Addendum)
Decreased Lyrica to twice a day, will call with further instructions.   Call office on 01/23/2016

## 2016-01-16 NOTE — Telephone Encounter (Signed)
Placed a call to Ms. Legendre regarding weaning of Lyrica.  Lyrica has been decreased to BID due to visual disturbance ( double vision).  Spoke with Dr. Naaman Plummer regarding the above.  On 01/23/16: we will prescribe Gabapentin 100 mg QHS for 4 days.   On 01/27/16 Gabapentin will be increased to 100 mg BID and Lyrica will be decreased to QHS for a week On 02/03/16 Gabapentin will be increased to 100 mg TID  She was instructed to call office on 01/23/2015

## 2016-01-17 ENCOUNTER — Encounter (HOSPITAL_COMMUNITY): Payer: Self-pay | Admitting: Emergency Medicine

## 2016-01-17 ENCOUNTER — Ambulatory Visit (HOSPITAL_COMMUNITY)
Admission: EM | Admit: 2016-01-17 | Discharge: 2016-01-17 | Disposition: A | Discharge status: Home / Self Care | Payer: Medicare Other | Attending: Family Medicine | Admitting: Family Medicine

## 2016-01-17 DIAGNOSIS — L03116 Cellulitis of left lower limb: Secondary | ICD-10-CM

## 2016-01-17 MED ORDER — DOXYCYCLINE HYCLATE 100 MG PO CAPS: 100.0000 mg | ORAL_CAPSULE | Freq: Two times a day (BID) | ORAL | 0 refills | Status: DC

## 2016-01-17 MED ORDER — CEFTRIAXONE SODIUM 1 G IJ SOLR
INTRAMUSCULAR | Status: AC
  Filled 2016-01-17: qty 10

## 2016-01-17 MED ORDER — LIDOCAINE HCL (PF) 1 % IJ SOLN
INTRAMUSCULAR | Status: AC
  Filled 2016-01-17: qty 2

## 2016-01-17 MED ORDER — CEFTRIAXONE SODIUM 1 G IJ SOLR
1.0000 g | Freq: Once | INTRAMUSCULAR | Status: AC
  Administered 2016-01-17: 1 g via INTRAMUSCULAR

## 2016-01-17 NOTE — ED Provider Notes (Signed)
CSN: [id number]     Arrival date & time 01/17/16  1706 History   None    Chief Complaint  Patient presents with  . Wound Check   (Consider location/radiation/quality/duration/timing/severity/associated sxs/prior Treatment) Patient is here for evaluation of wound left lower extremity.  She lacerated her leg over a week ago and she has hx of LEE and the wound dehisced and she has been treated with keflex abx's which she is out of. She reports some discomfort and swelling in the left leg.      Wound Check     Past Medical History:  Diagnosis Date  . Arthritis    endstage changes bilateral knees/bilateral ankles.   . Asthma   . Carotid artery occlusion   . Chronic kidney disease   . Clotting disorder ([place])    pt denies this  . Contusion of left knee    due to fall 1/14.  [name] COPD (chronic obstructive pulmonary disease) (HCC)    pulmonary fibrosis  . Depression, reactive   . Diabetes mellitus   . Diastolic dysfunction   . Family history of heart disease   . High cholesterol   . Hypertension   . Interstitial lung disease ([place])   . 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    . Morbid obesity ([place])   . Normal coronary arteries    cardiac catheterization performed  10/31/14  . RA (rheumatoid arthritis) ([place])    has been off methotreaxte since 10/13.  [name] Shortness of breath   . Thyroid disease   . URI (upper respiratory infection)    Past Surgical History:  Procedure Laterality Date  . ABDOMINAL HYSTERECTOMY    . BRAIN SURGERY     Gamma knife 10/13. Needs repeat spring  '14  . CARDIAC CATHETERIZATION N/A 10/31/2014   Procedure: Right/Left Heart Cath and Coronary Angiography;  Surgeon: [name], MD;  Location: [place] [facility];  Service: Cardiovascular;  Laterality: N/A;  . ESOPHAGOGASTRODUODENOSCOPY (EGD) WITH PROPOFOL N/A 09/14/2014   Procedure: ESOPHAGOGASTRODUODENOSCOPY  (EGD) WITH PROPOFOL;  Surgeon: [name], MD;  Location[facility];  Service: Endoscopy;  Laterality: N/A;  . OVARY SURGERY    . SHOULDER SURGERY Left   . TONSILLECTOMY  age 53  . VIDEO BRONCHOSCOPY Bilateral 05/31/2013   Procedure: VIDEO BRONCHOSCOPY WITHOUT FLUORO;  Surgeon: Brand Males, MD;  Location: [place];  Service: Cardiopulmonary;  Laterality: Bilateral;  . video bronscoscopy  2000   lung   Family History  Problem Relation Age of Onset  . Diabetes Mother   . Heart attack Mother   . Hypertension Father   . Lung cancer Father   . Diabetes Sister   . Diabetes Brother   . Hypertension Brother   . Heart disease Brother   . Heart attack Brother   . Kidney cancer Brother   . Uterine cancer Daughter   . Breast cancer Sister   . Rheum arthritis Maternal Uncle   . Gout Brother   . Kidney failure Brother     x 5   Social History  Substance Use Topics  . Smoking status: Never Smoker  . Smokeless tobacco: Never Used  . Alcohol use No   OB History    No data available     Review of Systems  Constitutional: Negative.   HENT: Negative.   Eyes: Negative.   Respiratory: Negative.   Cardiovascular: Negative.   Gastrointestinal:  Negative.   Endocrine: Negative.   Genitourinary: Negative.   Musculoskeletal: Negative.   Skin: Positive for wound.  Allergic/Immunologic: Negative.   Neurological: Negative.   Hematological: Negative.   Psychiatric/Behavioral: Negative.     Allergies  Codeine; Remicade [infliximab]; and Zestril [lisinopril]  Home Medications   Prior to Admission medications   Medication Sig Start Date End Date Taking? Authorizing Provider  ACCU-CHEK SMARTVIEW test strip 1 each by Other route every morning.  03/18/14   Historical Provider, MD  acetaminophen (TYLENOL) 500 MG tablet Take 1,000 mg by mouth every 6 (six) hours as needed for moderate pain.     Historical Provider, MD  albuterol (PROVENTIL HFA;VENTOLIN HFA) 108 (90 BASE)  MCG/ACT inhaler Inhale 2 puffs into the lungs every 6 (six) hours as needed for wheezing or shortness of breath.    Historical Provider, MD  allopurinol (ZYLOPRIM) 100 MG tablet Take 2 tablets (200 mg total) by mouth daily. 06/21/14   Meredith Staggers, MD  aspirin EC 81 MG tablet Take 81 mg by mouth every morning.     Historical Provider, MD  atorvastatin (LIPITOR) 20 MG tablet Take 10 mg by mouth every evening.     Historical Provider, MD  azithromycin (ZITHROMAX) 500 MG tablet Take 1 tablet by mouth daily. TAKE ON MON, WED & FRI 09/20/15   Historical Provider, MD  Calcium Carb-Cholecalciferol (CALCIUM + D3 PO) Take 2 tablets by mouth daily with lunch.     Historical Provider, MD  cephALEXin (KEFLEX) 500 MG capsule Take 1 capsule (500 mg total) by mouth 4 (four) times daily. 01/03/16   Okey Regal, PA-C  cholecalciferol (VITAMIN D) 1000 UNITS tablet Take 2,000 Units by mouth daily with lunch.     Historical Provider, MD  colchicine 0.6 MG tablet Take 0.6 mg by mouth daily. 12/10/15 02/08/16  Historical Provider, MD  diclofenac sodium (VOLTAREN) 1 % GEL Apply 1 application topically 3 (three) times daily. 03/22/15   Meredith Staggers, MD  doxycycline (VIBRAMYCIN) 100 MG capsule Take 1 capsule (100 mg total) by mouth 2 (two) times daily. 01/17/16   Lysbeth Penner, FNP  DULoxetine (CYMBALTA) 60 MG capsule Take 1 capsule (60 mg total) by mouth daily. 11/15/14   Meredith Staggers, MD  famotidine (PEPCID) 20 MG tablet Take 1 tablet (20 mg total) by mouth at bedtime. 11/02/14   Erlene Quan, PA-C  fluticasone (FLONASE) 50 MCG/ACT nasal spray Place 2 sprays into both nostrils daily.    Historical Provider, MD  furosemide (LASIX) 40 MG tablet Take 1 tablet (40 mg total) by mouth 2 (two) times daily. Patient taking differently: Take 40 mg by mouth daily.  01/03/16   Erlene Quan, PA-C  guaiFENesin (MUCINEX) 600 MG 12 hr tablet Take 1,200 mg by mouth 2 (two) times daily.    Historical Provider, MD  levothyroxine  (SYNTHROID, LEVOTHROID) 50 MCG tablet Take 1 tablet (50 mcg total) by mouth daily. 07/23/12   Bary Leriche, PA-C  metoprolol succinate (TOPROL-XL) 100 MG 24 hr tablet Take 100 mg by mouth every morning. Take with or immediately following a meal.    Historical Provider, MD  morphine (MS CONTIN) 15 MG 12 hr tablet Take 1 tablet (15 mg total) by mouth every 12 (twelve) hours. 01/16/16   [name], NP  morphine (MSIR) 15 MG tablet Take 1/2 to 1 tablet by mouth every 6 hours as needed for pain. 09/24/15   Meredith Staggers, MD  Multiple Vitamin (MULTIVITAMIN  WITH MINERALS) TABS Take 1 tablet by mouth daily with lunch.     Historical Provider, MD  olmesartan-hydrochlorothiazide (BENICAR HCT) 40-25 MG tablet Take 1 tablet by mouth daily.  05/04/14   Historical Provider, MD  OXYGEN-HELIUM IN Inhale 2 L into the lungs at bedtime. And on exertion    Historical Provider, MD  pantoprazole (PROTONIX) 40 MG tablet take 1 tablet by mouth once daily MINUTES BEFORE 1ST MEAL OF THE DAY 11/15/15   Brand Males, MD  predniSONE (DELTASONE) 10 MG tablet Take 10 mg by mouth every morning. 06/20/15   Historical Provider, MD  sodium chloride (OCEAN) 0.65 % SOLN nasal spray Place 1 spray into both nostrils as needed for congestion.    Historical Provider, MD  Spacer/Aero-Holding Chambers (AEROCHAMBER MV) inhaler Use as instructed 06/10/13   Tammy S Parrett, NP  VICTOZA 18 MG/3ML SOPN Inject 1.8 mg as directed daily.  01/26/15   Historical Provider, MD   Meds Ordered and Administered this Visit   Medications  cefTRIAXone (ROCEPHIN) injection 1 g (not administered)    There were no vitals taken for this visit. No data found.   Physical Exam  Constitutional: She is oriented to person, place, and time. She appears well-developed and well-nourished.  HENT:  Head: Normocephalic and atraumatic.  Eyes: EOM are normal. Pupils are equal, round, and reactive to light.  Neck: Normal range of motion. Neck supple.   Cardiovascular: Normal rate, regular rhythm and normal heart sounds.   Pulmonary/Chest: Effort normal and breath sounds normal.  Neurological: She is alert and oriented to person, place, and time.  Skin:  12 cm wound left lower extremity lateral pre-tibial area. Wound is clean with clear exudate.  Wet to dry dressing is applied.  Nursing note and vitals reviewed.   Urgent Care Course   Clinical Course    Wound packing Date/Time: 01/17/2016 6:13 PM Performed by: Lysbeth Penner Authorized by: Lysbeth Penner  Consent: Verbal consent obtained. Written consent not obtained. Consent given by: patient Patient understanding: patient states understanding of the procedure being performed Patient consent: the patient's understanding of the procedure matches consent given Patient identity confirmed: verbally with patient Time out: Immediately prior to procedure a "time out" was called to verify the correct patient, procedure, equipment, support staff and site/side marked as required. Preparation: Patient was prepped and draped in the usual sterile fashion. Local anesthesia used: no  Anesthesia: Local anesthesia used: no  Sedation: Patient sedated: no Patient tolerance: Patient tolerated the procedure well with no immediate complications Comments: Wet to dry dressing with 2 saline soaked 4x4's and 2 dry 4x4 's placed over this and then coban used as bandage and then bandage taped.  Patient advised to do dressing changes daily.    (including critical care time)  Labs Review Labs Reviewed - No data to display  Imaging Review No results found.   Visual Acuity Review  Right Eye Distance:   Left Eye Distance:   Bilateral Distance:    Right Eye Near:   Left Eye Near:    Bilateral Near:         MDM   1. Cellulitis of left lower extremity    Doxycycline 157m one po bid x 10 days #20 Rocephin 1 gram IM  Wet to dry dressing left lower extremity wound and then coban  applied and taped. Advised to do wet to dry dressings qd and follow up with PCP and with wound care when appointment is available.  Lysbeth Penner, FNP 01/17/16 [phone]

## 2016-01-17 NOTE — ED Notes (Addendum)
Pt wound was undressed by me on arrival.  Wound had some drainage, but hard to determine color and clarity with silvademe cream in the wound.  Pt reports shoot pains still, but thinks the wound looks better.    Wound assessed and redressed by Stevan Born, FNP.

## 2016-01-17 NOTE — ED Triage Notes (Signed)
Pt here for wound check of the left lower leg.  Pt states she is feeling a little better than yesterday but is still having the shooting pains.

## 2016-01-19 DIAGNOSIS — I5032 Chronic diastolic (congestive) heart failure: Secondary | ICD-10-CM | POA: Diagnosis not present

## 2016-01-19 DIAGNOSIS — E1140 Type 2 diabetes mellitus with diabetic neuropathy, unspecified: Secondary | ICD-10-CM | POA: Diagnosis not present

## 2016-01-19 DIAGNOSIS — I130 Hypertensive heart and chronic kidney disease with heart failure and stage 1 through stage 4 chronic kidney disease, or unspecified chronic kidney disease: Secondary | ICD-10-CM | POA: Diagnosis not present

## 2016-01-19 DIAGNOSIS — S81812D Laceration without foreign body, left lower leg, subsequent encounter: Secondary | ICD-10-CM | POA: Diagnosis not present

## 2016-01-19 DIAGNOSIS — T798XXD Other early complications of trauma, subsequent encounter: Secondary | ICD-10-CM | POA: Diagnosis not present

## 2016-01-19 DIAGNOSIS — E1129 Type 2 diabetes mellitus with other diabetic kidney complication: Secondary | ICD-10-CM | POA: Diagnosis not present

## 2016-01-23 DIAGNOSIS — I5032 Chronic diastolic (congestive) heart failure: Secondary | ICD-10-CM | POA: Diagnosis not present

## 2016-01-23 DIAGNOSIS — E1140 Type 2 diabetes mellitus with diabetic neuropathy, unspecified: Secondary | ICD-10-CM | POA: Diagnosis not present

## 2016-01-23 DIAGNOSIS — E1129 Type 2 diabetes mellitus with other diabetic kidney complication: Secondary | ICD-10-CM | POA: Diagnosis not present

## 2016-01-23 DIAGNOSIS — T798XXD Other early complications of trauma, subsequent encounter: Secondary | ICD-10-CM | POA: Diagnosis not present

## 2016-01-23 DIAGNOSIS — S81812D Laceration without foreign body, left lower leg, subsequent encounter: Secondary | ICD-10-CM | POA: Diagnosis not present

## 2016-01-23 DIAGNOSIS — I130 Hypertensive heart and chronic kidney disease with heart failure and stage 1 through stage 4 chronic kidney disease, or unspecified chronic kidney disease: Secondary | ICD-10-CM | POA: Diagnosis not present

## 2016-01-25 ENCOUNTER — Telehealth: Payer: Self-pay | Admitting: Registered Nurse

## 2016-01-25 DIAGNOSIS — E1140 Type 2 diabetes mellitus with diabetic neuropathy, unspecified: Secondary | ICD-10-CM | POA: Diagnosis not present

## 2016-01-25 DIAGNOSIS — T798XXD Other early complications of trauma, subsequent encounter: Secondary | ICD-10-CM | POA: Diagnosis not present

## 2016-01-25 DIAGNOSIS — E1129 Type 2 diabetes mellitus with other diabetic kidney complication: Secondary | ICD-10-CM | POA: Diagnosis not present

## 2016-01-25 DIAGNOSIS — I5032 Chronic diastolic (congestive) heart failure: Secondary | ICD-10-CM | POA: Diagnosis not present

## 2016-01-25 DIAGNOSIS — I130 Hypertensive heart and chronic kidney disease with heart failure and stage 1 through stage 4 chronic kidney disease, or unspecified chronic kidney disease: Secondary | ICD-10-CM | POA: Diagnosis not present

## 2016-01-25 DIAGNOSIS — S81812D Laceration without foreign body, left lower leg, subsequent encounter: Secondary | ICD-10-CM | POA: Diagnosis not present

## 2016-01-25 NOTE — Telephone Encounter (Signed)
Return Ms. Blakeley call, we reviewed her Gabapentin schedule she verbalizes understanding. Instructed to call with any questions she verbalizes understanding.

## 2016-01-28 DIAGNOSIS — G5621 Lesion of ulnar nerve, right upper limb: Secondary | ICD-10-CM | POA: Diagnosis not present

## 2016-01-28 DIAGNOSIS — S81802A Unspecified open wound, left lower leg, initial encounter: Secondary | ICD-10-CM | POA: Diagnosis not present

## 2016-01-28 DIAGNOSIS — M0589 Other rheumatoid arthritis with rheumatoid factor of multiple sites: Secondary | ICD-10-CM | POA: Diagnosis not present

## 2016-01-28 DIAGNOSIS — M1000 Idiopathic gout, unspecified site: Secondary | ICD-10-CM | POA: Diagnosis not present

## 2016-01-28 DIAGNOSIS — M0579 Rheumatoid arthritis with rheumatoid factor of multiple sites without organ or systems involvement: Secondary | ICD-10-CM | POA: Diagnosis not present

## 2016-01-28 DIAGNOSIS — M1A00X0 Idiopathic chronic gout, unspecified site, without tophus (tophi): Secondary | ICD-10-CM | POA: Diagnosis not present

## 2016-01-29 DIAGNOSIS — S81812D Laceration without foreign body, left lower leg, subsequent encounter: Secondary | ICD-10-CM | POA: Diagnosis not present

## 2016-01-29 DIAGNOSIS — E1129 Type 2 diabetes mellitus with other diabetic kidney complication: Secondary | ICD-10-CM | POA: Diagnosis not present

## 2016-01-29 DIAGNOSIS — I5032 Chronic diastolic (congestive) heart failure: Secondary | ICD-10-CM | POA: Diagnosis not present

## 2016-01-29 DIAGNOSIS — E1140 Type 2 diabetes mellitus with diabetic neuropathy, unspecified: Secondary | ICD-10-CM | POA: Diagnosis not present

## 2016-01-29 DIAGNOSIS — T798XXD Other early complications of trauma, subsequent encounter: Secondary | ICD-10-CM | POA: Diagnosis not present

## 2016-01-29 DIAGNOSIS — I130 Hypertensive heart and chronic kidney disease with heart failure and stage 1 through stage 4 chronic kidney disease, or unspecified chronic kidney disease: Secondary | ICD-10-CM | POA: Diagnosis not present

## 2016-01-31 DIAGNOSIS — S81812D Laceration without foreign body, left lower leg, subsequent encounter: Secondary | ICD-10-CM | POA: Diagnosis not present

## 2016-01-31 DIAGNOSIS — T798XXD Other early complications of trauma, subsequent encounter: Secondary | ICD-10-CM | POA: Diagnosis not present

## 2016-01-31 DIAGNOSIS — I130 Hypertensive heart and chronic kidney disease with heart failure and stage 1 through stage 4 chronic kidney disease, or unspecified chronic kidney disease: Secondary | ICD-10-CM | POA: Diagnosis not present

## 2016-01-31 DIAGNOSIS — E1129 Type 2 diabetes mellitus with other diabetic kidney complication: Secondary | ICD-10-CM | POA: Diagnosis not present

## 2016-01-31 DIAGNOSIS — I5032 Chronic diastolic (congestive) heart failure: Secondary | ICD-10-CM | POA: Diagnosis not present

## 2016-01-31 DIAGNOSIS — E1140 Type 2 diabetes mellitus with diabetic neuropathy, unspecified: Secondary | ICD-10-CM | POA: Diagnosis not present

## 2016-02-01 ENCOUNTER — Encounter: Payer: Medicare Other | Attending: Surgery | Admitting: Surgery

## 2016-02-01 DIAGNOSIS — L97222 Non-pressure chronic ulcer of left calf with fat layer exposed: Secondary | ICD-10-CM | POA: Insufficient documentation

## 2016-02-01 DIAGNOSIS — Z79899 Other long term (current) drug therapy: Secondary | ICD-10-CM | POA: Insufficient documentation

## 2016-02-01 DIAGNOSIS — M069 Rheumatoid arthritis, unspecified: Secondary | ICD-10-CM | POA: Diagnosis not present

## 2016-02-01 DIAGNOSIS — N189 Chronic kidney disease, unspecified: Secondary | ICD-10-CM | POA: Diagnosis not present

## 2016-02-01 DIAGNOSIS — I129 Hypertensive chronic kidney disease with stage 1 through stage 4 chronic kidney disease, or unspecified chronic kidney disease: Secondary | ICD-10-CM | POA: Insufficient documentation

## 2016-02-01 DIAGNOSIS — E1122 Type 2 diabetes mellitus with diabetic chronic kidney disease: Secondary | ICD-10-CM | POA: Diagnosis not present

## 2016-02-01 DIAGNOSIS — L97821 Non-pressure chronic ulcer of other part of left lower leg limited to breakdown of skin: Secondary | ICD-10-CM | POA: Diagnosis not present

## 2016-02-01 DIAGNOSIS — J449 Chronic obstructive pulmonary disease, unspecified: Secondary | ICD-10-CM | POA: Diagnosis not present

## 2016-02-01 DIAGNOSIS — E1140 Type 2 diabetes mellitus with diabetic neuropathy, unspecified: Secondary | ICD-10-CM | POA: Diagnosis not present

## 2016-02-01 DIAGNOSIS — E11622 Type 2 diabetes mellitus with other skin ulcer: Secondary | ICD-10-CM | POA: Diagnosis not present

## 2016-02-01 DIAGNOSIS — I890 Lymphedema, not elsewhere classified: Secondary | ICD-10-CM | POA: Insufficient documentation

## 2016-02-04 DIAGNOSIS — I130 Hypertensive heart and chronic kidney disease with heart failure and stage 1 through stage 4 chronic kidney disease, or unspecified chronic kidney disease: Secondary | ICD-10-CM | POA: Diagnosis not present

## 2016-02-04 DIAGNOSIS — I5032 Chronic diastolic (congestive) heart failure: Secondary | ICD-10-CM | POA: Diagnosis not present

## 2016-02-04 DIAGNOSIS — E1129 Type 2 diabetes mellitus with other diabetic kidney complication: Secondary | ICD-10-CM | POA: Diagnosis not present

## 2016-02-04 DIAGNOSIS — S81812D Laceration without foreign body, left lower leg, subsequent encounter: Secondary | ICD-10-CM | POA: Diagnosis not present

## 2016-02-04 DIAGNOSIS — E1140 Type 2 diabetes mellitus with diabetic neuropathy, unspecified: Secondary | ICD-10-CM | POA: Diagnosis not present

## 2016-02-04 DIAGNOSIS — T798XXD Other early complications of trauma, subsequent encounter: Secondary | ICD-10-CM | POA: Diagnosis not present

## 2016-02-04 NOTE — Progress Notes (Signed)
DABNEY, DEVER ([id number]) Visit Report for 02/01/2016 Chief Complaint Document Details Patient Name: DAMESHA, [name]. Date of Service: 02/01/2016 8:00 AM Medical Record Number: [id number] Patient Account Number: [id number] Date of Birth/Sex: 02-Feb-1941 (75 y.o. Female) Treating RN: Cornell Barman Primary Care Physician: Glendale Chard Other Clinician: Referring Physician: Glendale Chard Treating Physician/Extender: Frann Rider in Treatment: 0 Information Obtained from: Patient Chief Complaint Patients presents for treatment of an open diabetic ulcer the left lower extremity which she's had for about 4 weeks Electronic Signature(s) Signed: 02/01/2016 9:20:38 AM By: Christin Fudge MD, FACS Entered By: Christin Fudge on 02/01/2016 09:20:38 [name] ([id number]) -------------------------------------------------------------------------------- Debridement Details Patient Name: [name]. Date of Service: 02/01/2016 8:00 AM Medical Record Number: [id number] Patient Account Number: [id number] Date of Birth/Sex: [birth year] (75 y.o. Female) Treating RN: Cornell Barman Primary Care Physician: Glendale Chard Other Clinician: Referring Physician: Glendale Chard Treating Physician/Extender: Frann Rider in Treatment: 0 Debridement Performed for Wound #1 Left,Lateral Lower Leg Assessment: Performed By: Physician Christin Fudge, MD Debridement: Debridement Pre-procedure Yes - 09:03 Verification/Time Out Taken: Start Time: 09:04 Pain Control: Lidocaine 4% Topical Solution Level: Skin/Subcutaneous Tissue Total Area Debrided (L x 6 (cm) x 4.2 (cm) = 25.2 (cm) W): Tissue and other Viable, Non-Viable, Eschar, Fibrin/Slough, Subcutaneous material debrided: Instrument: Forceps, Scissors Bleeding: Minimum Hemostasis Achieved: Silver Nitrate End Time: 09:09 Procedural Pain: 0 Post Procedural Pain: 0 Response to Treatment: Procedure was tolerated well Post  Debridement Measurements of Total Wound Length: (cm) 6 Width: (cm) 4.2 Depth: (cm) 0.2 Volume: (cm) 3.958 Character of Wound/Ulcer Post Improved Debridement: Severity of Tissue Post Debridement: Fat layer exposed Post Procedure Diagnosis Same as Pre-procedure Electronic Signature(s) Signed: 02/01/2016 9:20:21 AM By: Christin Fudge MD, FACS Signed: 02/04/2016 10:02:59 AM By: Gretta Cool, RN, BSN, Kim RN, BSN Entered By: Christin Fudge on 02/01/2016 09:20:20 [name], [name] ([name], Edwena Felty ([id number]) -------------------------------------------------------------------------------- HPI Details Patient Name: [name], [name]. Date of Service: 02/01/2016 8:00 AM Medical Record Number: [id number] Patient Account Number: [id number] Date of Birth/Sex: [birth year] (75 y.o. Female) Treating RN: Cornell Barman Primary Care Physician: Glendale Chard Other Clinician: Referring Physician: Glendale Chard Treating Physician/Extender: Frann Rider in Treatment: 0 History of Present Illness Location: left lower extremity lateral calf Quality: Patient reports experiencing a dull pain to affected area(s). Severity: Patient states wound are getting worse. Duration: Patient has had the wound for < 4 weeks prior to presenting for treatment Timing: Pain in wound is Intermittent (comes and goes Context: The wound occurred when the patient was injured with a sharp corner for car door Modifying Factors: Other treatment(s) tried include:local care and 3 courses of antibiotics including Keflex, Bactrim and doxycycline Associated Signs and Symptoms: Patient reports having increase swelling. HPI Description: 75 year old patient with a past medical history of diabetes mellitus, chronic kidney disease, COPD, hypertension, rheumatoid arthritis had a lacerated wound to her left calf when she injured it on the corner of a car door and it was a long 12 cm laceration. x-ray done at the time revealed  that there was no fracture or radiopaque foreign body. the wound was sutured with 4-0 Prolene simple interrupted sutures and a total of 14 sutures were applied. on August 31 she was seen in the ER and it was noted that the wound had dehisced and she was treated with Keflex. She has completed a course of doxycycline recently Medical history also significant for status post abdominal hysterectomy, brain surgery, cardiac catheterization, EGD and bronchoscopy. of late she was  back in the ER and was seen last on August 31 and they tried Immunologist) Signed: 02/01/2016 9:17:39 AM By: Christin Fudge MD, FACS Previous Signature: 02/01/2016 8:57:33 AM Version By: Christin Fudge MD, FACS Entered By: Christin Fudge on 02/01/2016 09:17:39 [name] ([id number]) -------------------------------------------------------------------------------- Physical Exam Details Patient Name: [name]. Date of Service: 02/01/2016 8:00 AM Medical Record Number: [id number] Patient Account Number: [id number] Date of Birth/Sex: 1941/04/05 (75 y.o. Female) Treating RN: Cornell Barman Primary Care Physician: Glendale Chard Other Clinician: Referring Physician: Glendale Chard Treating Physician/Extender: Frann Rider in Treatment: 0 Constitutional . Pulse regular. Respirations normal and unlabored. Afebrile. . Eyes Nonicteric. Reactive to light. Ears, Nose, Mouth, and Throat Lips, teeth, and gums WNL.[name] Moist mucosa without lesions. Neck supple and nontender. No palpable supraclavicular or cervical adenopathy. Normal sized without goiter. Respiratory WNL. No retractions.. Cardiovascular Pedal Pulses WNL. on the left is 1.16 on the right is 1.08. as stage I lymphedema both lower extremities. Gastrointestinal (GI) Abdomen without masses or tenderness.. No liver or spleen enlargement or tenderness.. Lymphatic No adneopathy. No adenopathy. No adenopathy. Musculoskeletal Adexa  without tenderness or enlargement.. Digits and nails w/o clubbing, cyanosis, infection, petechiae, ischemia, or inflammatory conditions.. Integumentary (Hair, Skin) No suspicious lesions. No crepitus or fluctuance. No peri-wound warmth or erythema. No masses.[name] Psychiatric Judgement and insight Intact.. No evidence of depression, anxiety, or agitation.. Notes is a large lacerated wound on the left lateral calf with a significant amount of necrotic skin and subcutaneous tissue and this was sharply removed with a forcep and scissors and a bleeding point cauterized with silver nitrate. Electronic Signature(s) Signed: 02/01/2016 9:39:31 AM By: Christin Fudge MD, FACS Entered By: Christin Fudge on 02/01/2016 09:39:30 [name] ([id number]) -------------------------------------------------------------------------------- Physician Orders Details Patient Name: [name] Date of Service: 02/01/2016 8:00 AM Medical Record Number: [id number] Patient Account Number: [id number] Date of Birth/Sex: Jun 12, 1940 (75 y.o. Female) Treating RN: Montey Hora Primary Care Physician: Glendale Chard Other Clinician: Referring Physician: Glendale Chard Treating Physician/Extender: Frann Rider in Treatment: 0 Verbal / Phone Orders: Yes Clinician: Montey Hora Read Back and Verified: Yes Diagnosis Coding Wound Cleansing Wound #1 Left,Lateral Lower Leg o Clean wound with Normal Saline. o May Shower, gently pat wound dry prior to applying new dressing. Anesthetic Wound #1 Left,Lateral Lower Leg o Topical Lidocaine 4% cream applied to wound bed prior to debridement Skin Barriers/Peri-Wound Care Wound #1 Left,Lateral Lower Leg o Skin Prep Primary Wound Dressing Wound #1 Left,Lateral Lower Leg o Santyl Ointment Secondary Dressing Wound #1 Left,Lateral Lower Leg o Non-adherent pad - t[place]a [place] Dressing Change Frequency Wound #1 Left,Lateral Lower Leg o Change  dressing every day. Follow-up Appointments Wound #1 Left,Lateral Lower Leg o Return Appointment in 1 week. Edema Control Wound #1 Left,Lateral Lower Leg o Patient to wear own compression stockings [place] ([id number]) Wound #1 Left,Lateral Lower Leg o [place] Nurse may visit PRN to address patientos wound care needs. o FACE TO FACE ENCOUNTER: MEDICARE and MEDICAID PATIENTS: I certify that this patient is under my care and that I had a face-to-face encounter that meets the physician face-to-face encounter requirements with this patient on this date. The encounter with the patient was in whole or in part for the following MEDICAL CONDITION: (primary reason for [place]) MEDICAL NECESSITY: I certify, that based on my findings, NURSING services are a medically necessary home health service. HOME BOUND STATUS: I certify that my clinical  findings support that this patient is homebound (i.e., Due to illness or injury, pt requires aid of supportive devices such as crutches, cane, wheelchairs, walkers, the use of special transportation or the assistance of another person to leave their place of residence. There is a normal inability to leave the home and doing so requires considerable and taxing effort. Other absences are for medical reasons / religious services and are infrequent or of short duration when for other reasons). o If current dressing causes regression in wound condition, may D/C ordered dressing product/s and apply Normal Saline Moist Dressing daily until next [place] / Other MD appointment. [place] of regression in wound condition at [phone]. o Please direct any NON-WOUND related issues/requests for orders to patient's Primary Care Physician Medications-please add to medication list. Wound #1 Left,Lateral Lower Leg o Santyl Enzymatic Ointment Patient  Medications Allergies: Remicade, codeine, Zestril Notifications Medication Indication Start End Santyl 02/01/2016 DOSE topical 250 unit/gram ointment - ointment topical as directed Electronic Signature(s) Signed: 02/01/2016 9:18:55 AM By: Christin Fudge MD, FACS Entered By: Christin Fudge on 02/01/2016 09:18:55 [name] ([id number]) -------------------------------------------------------------------------------- Problem List Details Patient Name: [name]. Date of Service: 02/01/2016 8:00 AM Medical Record Number: [id number] Patient Account Number: [id number] Date of Birth/Sex: 1940-07-14 (75 y.o. Female) Treating RN: Cornell Barman Primary Care Physician: Glendale Chard Other Clinician: Referring Physician: Glendale Chard Treating Physician/Extender: Frann Rider in Treatment: 0 Active Problems ICD-10 Encounter Code Description Active Date Diagnosis E11.622 Type 2 diabetes mellitus with other skin ulcer 02/01/2016 Yes L97.222 Non-pressure chronic ulcer of left calf with fat layer 02/01/2016 Yes exposed I89.0 Lymphedema, not elsewhere classified 02/01/2016 Yes Inactive Problems Resolved Problems Electronic Signature(s) Signed: 02/01/2016 9:19:47 AM By: Christin Fudge MD, FACS Entered By: Christin Fudge on 02/01/2016 09:19:46 [name] ([id number]) -------------------------------------------------------------------------------- Progress Note Details Patient Name: [name]. Date of Service: 02/01/2016 8:00 AM Medical Record Number: [id number] Patient Account Number: [id number] Date of Birth/Sex: 1941-01-10 (75 y.o. Female) Treating RN: Cornell Barman Primary Care Physician: Glendale Chard Other Clinician: Referring Physician: Glendale Chard Treating Physician/Extender: Frann Rider in Treatment: 0 Subjective Chief Complaint Information obtained from Patient Patients presents for treatment of an open diabetic ulcer the left lower extremity  which she's had for about 4 weeks History of Present Illness (HPI) The following HPI elements were documented for the patient's wound: Location: left lower extremity lateral calf Quality: Patient reports experiencing a dull pain to affected area(s). Severity: Patient states wound are getting worse. Duration: Patient has had the wound for < 4 weeks prior to presenting for treatment Timing: Pain in wound is Intermittent (comes and goes Context: The wound occurred when the patient was injured with a sharp corner for car door Modifying Factors: Other treatment(s) tried include:local care and 3 courses of antibiotics including Keflex, Bactrim and doxycycline Associated Signs and Symptoms: Patient reports having increase swelling. 75 year old patient with a past medical history of diabetes mellitus, chronic kidney disease, COPD, hypertension, rheumatoid arthritis had a lacerated wound to her left calf when she injured it on the corner of a car door and it was a long 12 cm laceration. x-ray done at the time revealed that there was no fracture or radiopaque foreign body. the wound was sutured with 4-0 Prolene simple interrupted sutures and a total of 14 sutures were applied. on August 31 she was seen in the ER and it was noted that the wound had dehisced and she was treated with Keflex. She  has completed a course of doxycycline recently Medical history also significant for status post abdominal hysterectomy, brain surgery, cardiac catheterization, EGD and bronchoscopy. of late she was back in the ER and was seen last on August 31 and they tried Silvadene Wound History Patient presents with 1 open wound that has been present for approximately 5 weeks. Patient has been treating wound in the following manner: calcium alginate. Laboratory tests have not been performed in the last month. Patient reportedly has not tested positive for an antibiotic resistant organism. Patient reportedly has not tested  positive for osteomyelitis. Patient reportedly has had testing performed to evaluate circulation in the legs. Patient experiences the following problems associated with their wounds: infection, swelling. Patient History Information obtained from Patient, Chart. MADDALYN, LUTZE ([id number]) Allergies Remicade, codeine, Zestril Family History Cancer - Child, Father, Siblings, Diabetes - Mother, Siblings, Heart Disease - Siblings, Hypertension - Father, Siblings, Kidney Disease - Siblings, Lung Disease - Father, No family history of Seizures, Stroke, Thyroid Problems, Tuberculosis. Social History Never smoker, Marital Status - Married, Alcohol Use - Never, Drug Use - No History, Caffeine Use - Daily. Medical History Eyes Patient has history of Cataracts Denies history of Glaucoma, Optic Neuritis Ear/Nose/Mouth/Throat Denies history of Chronic sinus problems/congestion, Middle ear problems Hematologic/Lymphatic Patient has history of Lymphedema Denies history of Anemia, Hemophilia, Human Immunodeficiency Virus, Sickle Cell Disease Respiratory Denies history of Aspiration, Asthma, Chronic Obstructive Pulmonary Disease (COPD), Pneumothorax, Sleep Apnea, Tuberculosis Cardiovascular Patient has history of Hypertension Denies history of Angina, Arrhythmia, Congestive Heart Failure, Coronary Artery Disease, Deep Vein Thrombosis, Hypotension, Myocardial Infarction, Peripheral Arterial Disease, Peripheral Venous Disease, Phlebitis, Vasculitis Gastrointestinal Denies history of Cirrhosis , Colitis, Crohn s, Hepatitis A, Hepatitis B, Hepatitis C Endocrine Patient has history of Type II Diabetes Denies history of Type I Diabetes Genitourinary Denies history of End Stage Renal Disease Immunological Denies history of Lupus Erythematosus, Raynaud s, Scleroderma Integumentary (Skin) Denies history of History of Burn, History of pressure wounds Musculoskeletal Patient has history of Gout,  Rheumatoid Arthritis, Osteoarthritis Denies history of Osteomyelitis Neurologic Patient has history of Neuropathy - right leg and foot Denies history of Dementia, Quadriplegia, Paraplegia, Seizure Disorder Oncologic Denies history of Received Chemotherapy, Received Radiation Psychiatric Denies history of Anorexia/bulimia, Confinement Anxiety [name], [name] ([id number]) Patient is treated with Oral Agents. Blood sugar is tested. Blood sugar results noted at the following times: Breakfast - 108. Medical And Surgical History Notes Constitutional Symptoms (General Health) RA (prednisone); Intersitual Lung Disease; HTN; O2 at night; ABX Arithomycin (Lungs); Brain tumor (2013) gamma treatments; hypo throid; CKD; Gout; Neuropathy Genitourinary CKD Review of Systems (ROS) Constitutional Symptoms (General Health) Complains or has symptoms of Fatigue. Denies complaints or symptoms of Fever, Chills, Marked Weight Change. Eyes Complains or has symptoms of Glasses / Contacts. Denies complaints or symptoms of Dry Eyes, Vision Changes. Ear/Nose/Mouth/Throat The patient has no complaints or symptoms. Hematologic/Lymphatic The patient has no complaints or symptoms. Respiratory Complains or has symptoms of Shortness of Breath. Denies complaints or symptoms of Chronic or frequent coughs. Cardiovascular Complains or has symptoms of Chest pain, LE edema. Gastrointestinal Denies complaints or symptoms of Frequent diarrhea, Nausea, Vomiting. Endocrine Complains or has symptoms of Thyroid disease. Denies complaints or symptoms of Hepatitis, Polydypsia (Excessive Thirst). Genitourinary Denies complaints or symptoms of Kidney failure/ Dialysis, Incontinence/dribbling. Immunological The patient has no complaints or symptoms. Integumentary (Skin) Complains or has symptoms of Wounds, Bleeding or bruising tendency. Denies complaints or symptoms of Breakdown. Musculoskeletal The patient has no  complaints or symptoms. Neurologic The patient has no complaints or symptoms. Oncologic The patient has no complaints or symptoms. Psychiatric Complains or has symptoms of Anxiety. Denies complaints or symptoms of Claustrophobia. [name], [name] ([id number]) Medications acetaminophen 500 mg tablet oral 2 2 tablet oral two times daily MS Contin 15 mg tablet,extended release oral 1 1 tablet extended release oral every 12 hours Benicar HCT 40 mg-25 mg tablet oral tablet oral gabapentin 600 mg tablet oral 1 1 tablet oral three times daily Victoza 2-Pak 0.6 mg/0.1 mL (18 mg/3 mL) subcutaneous pen injector subcutaneous pen injector subcutaneous 1.8 mg/3 mL daily atorvastatin 20 mg tablet oral one half tablet oral (38m.) every evening albuterol sulfate HFA 90 mcg/actuation aerosol inhaler inhalation 2 2 HFA aerosol inhaler inhalations every six hours as needed metoprolol succinate ER 100 mg tablet,extended release 24 hr oral 1 1 tablet extended release 24 hr oral daily Calcium 500 mg (1,250 mg) + D3 125 unit tablet oral 2 2 tablet oral daily cephalexin 500 mg capsule oral 1 1 capsule oral two times daily colchicine 0.6 mg tablet oral 1 1 tablet oral daily guaifenesin ER 600 mg tablet, extended release 12 hr oral 2 2 tablet extended release 12hr oral two times daily prednisone 10 mg tablet oral 1 1 tablet oral every other day famotidine 20 mg tablet oral 1 1 tablet oral nightly allopurinol 100 mg tablet oral 1 1 tablet oral furosemide 40 mg tablet oral 1 1 tablet oral daily azithromycin 500 mg tablet oral 1 1 tablet oral daily multivitamin tablet oral 1 1 tablet oral daily fluticasone 50 mcg/actuation nasal spray,suspension nasal 2 2 spray,suspension nasal daily in both nostrils sodium chloride 0.65 % nasal spray aerosol nasal 1 1 aerosol,spray nasal as needed toboth nostrils aspirin 81 mg tablet,delayed release oral 1 1 tablet,delayed release (DR/EC) oral pantoprazole 40 mg  tablet,delayed release oral 1 1 tablet,delayed release (DR/EC) oral daily duloxetine 60 mg capsule,delayed release oral 1 1 capsule,delayed release(DR/EC) oral daily levothyroxine 50 mcg tablet oral 1 1 tablet oral daily diclofenac 1 % topical gel topical gel topical three times daily Santyl 250 unit/gram topical ointment topical ointment topical as directed cholecalciferol (vitamin D3) 1,000 unit tablet oral 2 2 tablet oral daily Objective Constitutional Pulse regular. Respirations normal and unlabored. Afebrile. Vitals Time Taken: 8:17 AM, Temperature: 97.8 F, Pulse: 88 bpm, Respiratory Rate: 18 breaths/min, Blood Pressure: 138/78 mmHg. Eyes Nonicteric. Reactive to light. [name], [name]. ([id number] Ears, Nose, Mouth, and Throat Lips, teeth, and gums WNL..Marland KitchenMoist mucosa without lesions. Neck supple and nontender. No palpable supraclavicular or cervical adenopathy. Normal sized without goiter. Respiratory WNL. No retractions.. Cardiovascular Pedal Pulses WNL. on the left is 1.16 on the right is 1.08. as stage I lymphedema both lower extremities. Gastrointestinal (GI) Abdomen without masses or tenderness.. No liver or spleen enlargement or tenderness.. Lymphatic No adneopathy. No adenopathy. No adenopathy. Musculoskeletal Adexa without tenderness or enlargement.. Digits and nails w/o clubbing, cyanosis, infection, petechiae, ischemia, or inflammatory conditions..Marland KitchenPsychiatric Judgement and insight Intact.. No evidence of depression, anxiety, or agitation.. General Notes: is a large lacerated wound on the left lateral calf with a significant amount of necrotic skin and subcutaneous tissue and this was sharply removed with a forcep and scissors and a bleeding point cauterized with silver nitrate. Integumentary (Hair, Skin) No suspicious lesions. No crepitus or fluctuance. No peri-wound warmth or erythema. No masses.. Wound #1 status is Open. Original cause of wound was Trauma.  The wound is located on the Left,Lateral  Lower Leg. The wound measures 6cm length x 4.2cm width x 0.2cm depth; 19.792cm^2 area and 3.958cm^3 volume. The wound is limited to skin breakdown. There is no tunneling or undermining noted. There is a medium amount of sanguinous drainage noted. The wound margin is distinct with the outline attached to the wound base. There is small (1-33%) red, pink granulation within the wound bed. There is a large (67-100%) amount of necrotic tissue within the wound bed including Eschar and Adherent Slough. The periwound skin appearance exhibited: Induration, Localized Edema, Erythema. The periwound skin appearance did not exhibit: Callus, Crepitus, Excoriation, Fluctuance, Friable, Rash, Scarring, Dry/Scaly, Maceration, Moist, Atrophie Blanche, Cyanosis, Ecchymosis, Hemosiderin Staining, Mottled, Pallor, Rubor. The surrounding wound skin color is noted with erythema which is circumferential. Periwound temperature was noted as No Abnormality. The periwound has tenderness on palpation. [name], [name] ([id number]) Assessment Active Problems ICD-10 E11.622 - Type 2 diabetes mellitus with other skin ulcer L97.222 - Non-pressure chronic ulcer of left calf with fat layer exposed I89.0 - Lymphedema, not elsewhere classified 75 year old diabetic who has a large lacerated wound on her left lower extremity, in the setting of chronic lymphedema, immunocompromised status and inadequate local care has been reviewed and I have recommended: 1. Santyl appointment to be applied daily with a bordered foam. 2. elevation and exercise. 3. good control for diabetes mellitus 4. Adequate intake of protein, vitamin A, vitamin C and zinc 5. Regular visits to the wound center Procedures Wound #1 Wound #1 is a Diabetic Wound/Ulcer of the Lower Extremity located on the Left,Lateral Lower Leg . There was a Skin/Subcutaneous Tissue Debridement ([id number]) debridement with total area  of 25.2 sq cm performed by Christin Fudge, MD. with the following instrument(s): Forceps and Scissors to remove Viable and Non-Viable tissue/material including Fibrin/Slough, Eschar, and Subcutaneous after achieving pain control using Lidocaine 4% Topical Solution. A time out was conducted at 09:03, prior to the start of the procedure. A Minimum amount of bleeding was controlled with Silver Nitrate. The procedure was tolerated well with a pain level of 0 throughout and a pain level of 0 following the procedure. Post Debridement Measurements: 6cm length x 4.2cm width x 0.2cm depth; 3.958cm^3 volume. Character of Wound/Ulcer Post Debridement is improved. Severity of Tissue Post Debridement is: Fat layer exposed. Post procedure Diagnosis Wound #1: Same as Pre-Procedure Plan Wound Cleansing: Wound #1 Left,Lateral Lower Leg: Clean wound with Normal Saline. [name], [name] ([id number]) May Shower, gently pat wound dry prior to applying new dressing. Anesthetic: Wound #1 Left,Lateral Lower Leg: Topical Lidocaine 4% cream applied to wound bed prior to debridement Skin Barriers/Peri-Wound Care: Wound #1 Left,Lateral Lower Leg: Skin Prep Primary Wound Dressing: Wound #1 Left,Lateral Lower Leg: Santyl Ointment Secondary Dressing: Wound #1 Left,Lateral Lower Leg: Non-adherent pad - telfa island Dressing Change Frequency: Wound #1 Left,Lateral Lower Leg: Change dressing every day. Follow-up Appointments: Wound #1 Left,Lateral Lower Leg: Return Appointment in 1 week. Edema Control: Wound #1 Left,Lateral Lower Leg: Patient to wear own compression stockings Home Health: Wound #1 Left,Lateral Lower Leg: [place] Nurse may visit PRN to address patient s wound care needs. FACE TO FACE ENCOUNTER: MEDICARE and MEDICAID PATIENTS: I certify that this patient is under my care and that I had a face-to-face encounter that meets the physician face-to-face  encounter requirements with this patient on this date. The encounter with the patient was in whole or in part for the following MEDICAL CONDITION: (primary reason for [place]) MEDICAL NECESSITY: I  certify, that based on my findings, NURSING services are a medically necessary home health service. HOME BOUND STATUS: I certify that my clinical findings support that this patient is homebound (i.e., Due to illness or injury, pt requires aid of supportive devices such as crutches, cane, wheelchairs, walkers, the use of special transportation or the assistance of another person to leave their place of residence. There is a normal inability to leave the home and doing so requires considerable and taxing effort. Other absences are for medical reasons / religious services and are infrequent or of short duration when for other reasons). If current dressing causes regression in wound condition, may D/C ordered dressing product/s and apply Normal Saline Moist Dressing daily until next [place] / Other MD appointment. [place] of regression in wound condition at [phone]. Please direct any NON-WOUND related issues/requests for orders to patient's Primary Care Physician Medications-please add to medication list.: Wound #1 Left,Lateral Lower Leg: Santyl Enzymatic Ointment The following medication(s) was prescribed: Santyl topical 250 unit/gram ointment ointment topical as directed starting 02/01/2016 [name], [name] ([id number]) 75 year old diabetic who has a large lacerated wound on her left lower extremity, in the setting of chronic lymphedema, immunocompromised status and inadequate local care has been reviewed and I have recommended: 1. Santyl appointment to be applied daily with a bordered foam. 2. elevation and exercise. 3. good control for diabetes mellitus 4. Adequate intake of protein, vitamin A, vitamin C and zinc 5. Regular visits to the wound  center Electronic Signature(s) Signed: 02/01/2016 9:42:27 AM By: Christin Fudge MD, FACS Entered By: Christin Fudge on 02/01/2016 09:42:27 [name] ([id number]) -------------------------------------------------------------------------------- ROS/PFSH Details Patient Name: [name]. Date of Service: 02/01/2016 8:00 AM Medical Record Number: [id number] Patient Account Number: [id number] Date of Birth/Sex: 1940/12/22 (75 y.o. Female) Treating RN: Cornell Barman Primary Care Physician: Glendale Chard Other Clinician: Referring Physician: Glendale Chard Treating Physician/Extender: Frann Rider in Treatment: 0 Information Obtained From Patient Chart Wound History Do you currently have one or more open woundso Yes How many open wounds do you currently haveo 1 Approximately how long have you had your woundso 5 weeks How have you been treating your wound(s) until nowo calcium alginate Has your wound(s) ever healed and then re-openedo No Have you had any lab work done in the past montho No Have you tested positive for an antibiotic resistant organism (MRSA, VRE)o No Have you tested positive for osteomyelitis (bone infection)o No Have you had any tests for circulation on your legso Yes Have you had other problems associated with your woundso Infection, Swelling Constitutional Symptoms (General Health) Complaints and Symptoms: Positive for: Fatigue Negative for: Fever; Chills; Marked Weight Change Medical History: Past Medical History Notes: RA (prednisone); Intersitual Lung Disease; HTN; O2 at night; ABX Arithomycin (Lungs); Brain tumor (2013) gamma treatments; hypo throid; CKD; Gout; Neuropathy Eyes Complaints and Symptoms: Positive for: Glasses / Contacts Negative for: Dry Eyes; Vision Changes Medical History: Positive for: Cataracts Negative for: Glaucoma; Optic Neuritis Respiratory Complaints and Symptoms: Positive for: Shortness of Breath Negative for:  Chronic or frequent coughs Medical HistoryARLETHA, [name] ([id number]) Negative for: Aspiration; Asthma; Chronic Obstructive Pulmonary Disease (COPD); Pneumothorax; Sleep Apnea; Tuberculosis Cardiovascular Complaints and Symptoms: Positive for: Chest pain; LE edema Medical History: Positive for: Hypertension Negative for: Angina; Arrhythmia; Congestive Heart Failure; Coronary Artery Disease; Deep Vein Thrombosis; Hypotension; Myocardial Infarction; Peripheral Arterial Disease; Peripheral Venous Disease; Phlebitis; Vasculitis Gastrointestinal Complaints and Symptoms: Negative for: Frequent diarrhea; Nausea; Vomiting Medical  History: Negative for: Cirrhosis ; Colitis; Crohnos; Hepatitis A; Hepatitis B; Hepatitis C Endocrine Complaints and Symptoms: Positive for: Thyroid disease Negative for: Hepatitis; Polydypsia (Excessive Thirst) Medical History: Positive for: Type II Diabetes Negative for: Type I Diabetes Time with diabetes: 4 years Treated with: Oral agents Blood sugar tested every day: Yes Tested : Blood sugar testing results: Breakfast: 108 Genitourinary Complaints and Symptoms: Negative for: Kidney failure/ Dialysis; Incontinence/dribbling Medical History: Negative for: End Stage Renal Disease Past Medical History Notes: CKD Integumentary (Skin) Complaints and Symptoms: Positive for: Wounds; Bleeding or bruising tendency [name], [name] ([id number]) Negative for: Breakdown Medical History: Negative for: History of Burn; History of pressure wounds Musculoskeletal Complaints and Symptoms: No Complaints or Symptoms Complaints and Symptoms: Negative for: Muscle Pain; Muscle Weakness Medical History: Positive for: Gout; Rheumatoid Arthritis; Osteoarthritis Negative for: Osteomyelitis Psychiatric Complaints and Symptoms: Positive for: Anxiety Negative for: Claustrophobia Medical History: Negative for: Anorexia/bulimia; Confinement  Anxiety Ear/Nose/Mouth/Throat Complaints and Symptoms: No Complaints or Symptoms Medical History: Negative for: Chronic sinus problems/congestion; Middle ear problems Hematologic/Lymphatic Complaints and Symptoms: No Complaints or Symptoms Medical History: Positive for: Lymphedema Negative for: Anemia; Hemophilia; Human Immunodeficiency Virus; Sickle Cell Disease Immunological Complaints and Symptoms: No Complaints or Symptoms Medical History: Negative for: Lupus Erythematosus; Raynaudos; Scleroderma [name], Rielyn E. ([id number]) Neurologic Complaints and Symptoms: No Complaints or Symptoms Medical History: Positive for: Neuropathy - right leg and foot Negative for: Dementia; Quadriplegia; Paraplegia; Seizure Disorder Oncologic Complaints and Symptoms: No Complaints or Symptoms Medical History: Negative for: Received Chemotherapy; Received Radiation HBO Extended History Items Eyes: Cataracts Immunizations Pneumococcal Vaccine: Received Pneumococcal Vaccination: Yes Tetanus Vaccine: Last tetanus shot: 12/27/2015 Family and Social History Cancer: Yes - Child, Father, Siblings; Diabetes: Yes - Mother, Siblings; Heart Disease: Yes - Siblings; Hypertension: Yes - Father, Siblings; Kidney Disease: Yes - Siblings; Lung Disease: Yes - Father; Seizures: No; Stroke: No; Thyroid Problems: No; Tuberculosis: No; Never smoker; Marital Status - Married; Alcohol Use: Never; Drug Use: No History; Caffeine Use: Daily; Advanced Directives: Yes (Not Provided); Patient does not want information on Advanced Directives; Do not resuscitate: No; Living Will: Yes (Not Provided); Medical Power of Attorney: Yes (Not Provided) Physician Affirmation I have reviewed and agree with the above information. Electronic Signature(s) Signed: 02/01/2016 12:32:55 PM By: Christin Fudge MD, FACS Signed: 02/04/2016 10:02:59 AM By: Gretta Cool RN, BSN, Kim RN, BSN Entered By: Christin Fudge on 02/01/2016  08:50:38 [name] ([id number]) -------------------------------------------------------------------------------- SuperBill Details Patient Name: TAYJAH, LOBDELL. Date of Service: 02/01/2016 Medical Record Number: [id number] Patient Account Number: [id number] Date of Birth/Sex: 1941-02-19 (75 y.o. Female) Treating RN: Cornell Barman Primary Care Physician: Glendale Chard Other Clinician: Referring Physician: Glendale Chard Treating Physician/Extender: Frann Rider in Treatment: 0 Diagnosis Coding ICD-10 Codes Code Description E11.622 Type 2 diabetes mellitus with other skin ulcer L97.222 Non-pressure chronic ulcer of left calf with fat layer exposed I89.0 Lymphedema, not elsewhere classified Facility Procedures CPT4 Code: 97588325 Description: 99213 - WOUND CARE VISIT-LEV 3 EST PT Modifier: Quantity: 1 CPT4 Code: 49826415 Description: 11042 - DEB SUBQ TISSUE 20 SQ CM/< ICD-10 Description Diagnosis E11.622 Type 2 diabetes mellitus with other skin ulcer L97.222 Non-pressure chronic ulcer of left calf with fat l I89.0 Lymphedema, not elsewhere classified Modifier: ayer exposed Quantity: 1 CPT4 Code: 83094076 Description: 11045 - DEB SUBQ TISS EA ADDL 20CM ICD-10 Description Diagnosis E11.622 Type 2 diabetes mellitus with other skin ulcer L97.222 Non-pressure chronic ulcer of left calf with fat l I89.0 Lymphedema, not elsewhere classified Modifier: ayer exposed  Quantity: 1 Physician Procedures CPT4 Code: 7494496 Description: 75916 - WC PHYS LEVEL 4 - NEW PT ICD-10 Description Diagnosis E11.622 Type 2 diabetes mellitus with other skin ulcer L97.222 Non-pressure chronic ulcer of left calf with fat la I89.0 Lymphedema, not elsewhere classified Modifier: 37 yer exposed Quantity: 1 CPT4 Code: 3846659 [name], DELO Description: 11042 - WC PHYS SUBQ TISS 20 SQ CM RIS E. ([id number]) Modifier: Quantity: 1 Electronic Signature(s) Signed: 02/01/2016 9:42:59 AM By: Christin Fudge  MD, FACS Entered By: Christin Fudge on 02/01/2016 09:42:59

## 2016-02-04 NOTE — Progress Notes (Signed)
[name], [name] ([id number]) Visit Report for 02/01/2016 Abuse/Suicide Risk Screen Details Patient Name: [name], [name]. Date of Service: 02/01/2016 8:00 AM Medical Record Number: [id number] Patient Account Number: [id number] Date of Birth/Sex: 26-Jul-1940 (75 y.o. Female) Treating RN: Cornell Barman Primary Care Physician: Glendale Chard Other Clinician: Referring Physician: Glendale Chard Treating Physician/Extender: Frann Rider in Treatment: 0 Abuse/Suicide Risk Screen Items Answer ABUSE/SUICIDE RISK SCREEN: Has anyone close to you tried to hurt or harm you recentlyo No Do you feel uncomfortable with anyone in your familyo No Has anyone forced you do things that you didnot want to doo No Do you have any thoughts of harming yourselfo No Patient displays signs or symptoms of abuse and/or neglect. No Electronic Signature(s) Signed: 02/04/2016 10:02:59 AM By: Gretta Cool, RN, BSN, Kim RN, BSN Entered By: Gretta Cool, RN, BSN, Kim on 02/01/2016 08:32:07 [name] ([id number]) -------------------------------------------------------------------------------- Activities of Daily Living Details Patient Name: [name]. Date of Service: 02/01/2016 8:00 AM Medical Record Number: [id number] Patient Account Number: [id number] Date of Birth/Sex: 01/09/1941 (75 y.o. Female) Treating RN: Cornell Barman Primary Care Physician: Glendale Chard Other Clinician: Referring Physician: Glendale Chard Treating Physician/Extender: Frann Rider in Treatment: 0 Activities of Daily Living Items Answer Activities of Daily Living (Please select one for each item) Drive Automobile Need Assistance Take Medications Completely Able Use Telephone Completely Able Care for Appearance Completely Able Use Toilet Completely Able Bath / Shower Completely Able Dress Self Completely Able Feed Self Completely Able Walk Completely Able Get In / Out Bed Completely Able Housework Need  Assistance Prepare Meals Need Assistance Handle Money Need Assistance Shop for Self Need Assistance Electronic Signature(s) Signed: 02/04/2016 10:02:59 AM By: Gretta Cool, RN, BSN, Kim RN, BSN Entered By: Gretta Cool, RN, BSN, Kim on 02/01/2016 08:32:52 [name] ([id number]) -------------------------------------------------------------------------------- Education Assessment Details Patient Name: [name] Date of Service: 02/01/2016 8:00 AM Medical Record Number: [id number] Patient Account Number: [id number] Date of Birth/Sex: December 06, 1940 (75 y.o. Female) Treating RN: Cornell Barman Primary Care Physician: Glendale Chard Other Clinician: Referring Physician: Glendale Chard Treating Physician/Extender: Frann Rider in Treatment: 0 Primary Learner Assessed: Patient Learning Preferences/Education Level/Primary Language Learning Preference: Explanation, Demonstration Highest Education Level: College or Above Preferred Language: English Cognitive Barrier Assessment/Beliefs Language Barrier: No Translator Needed: No Memory Deficit: No Emotional Barrier: No Cultural/Religious Beliefs Affecting Medical No Care: Physical Barrier Assessment Impaired Vision: Yes Glasses Impaired Hearing: No Decreased Hand dexterity: No Knowledge/Comprehension Assessment Knowledge Level: High Comprehension Level: High Ability to understand written High instructions: Ability to understand verbal High instructions: Motivation Assessment Anxiety Level: Calm Cooperation: Cooperative Education Importance: Acknowledges Need Interest in Health Problems: Asks Questions Perception: Coherent Willingness to Engage in Self- High Management Activities: Readiness to Engage in Self- High Management Activities: Electronic Signature(s) DONA, WALBY ([id number]) Signed: 02/04/2016 10:02:59 AM By: Gretta Cool, RN, BSN, Kim RN, BSN Entered By: Gretta Cool, RN, BSN, Kim on 02/01/2016 08:33:28 [name] ([id number]) -------------------------------------------------------------------------------- Fall Risk Assessment Details Patient Name: [name]. Date of Service: 02/01/2016 8:00 AM Medical Record Number: [id number] Patient Account Number: [id number] Date of Birth/Sex: Oct 15, 1940 (75 y.o. Female) Treating RN: Cornell Barman Primary Care Physician: Glendale Chard Other Clinician: Referring Physician: Glendale Chard Treating Physician/Extender: Frann Rider in Treatment: 0 Fall Risk Assessment Items Have you had 2 or more falls in the last 12 monthso 0 No Have you had any fall that resulted in injury in the last 12 monthso 0 No FALL RISK ASSESSMENT: History of falling - immediate or  within 3 months 0 No Secondary diagnosis 0 No Ambulatory aid None/bed rest/wheelchair/nurse 0 No Crutches/cane/walker 15 Yes Furniture 0 No IV Access/Saline Lock 0 No Gait/Training Normal/bed rest/immobile 0 No Weak 10 Yes Impaired 0 No Mental Status Oriented to own ability 0 Yes Electronic Signature(s) Signed: 02/04/2016 10:02:59 AM By: Gretta Cool, RN, BSN, Kim RN, BSN Entered By: Gretta Cool, RN, BSN, Kim on 02/01/2016 08:33:54 [name] ([id number]) -------------------------------------------------------------------------------- Foot Assessment Details Patient Name: [name]. Date of Service: 02/01/2016 8:00 AM Medical Record Number: [id number] Patient Account Number: [id number] Date of Birth/Sex: 1940/07/31 (75 y.o. Female) Treating RN: Cornell Barman Primary Care Physician: Glendale Chard Other Clinician: Referring Physician: Glendale Chard Treating Physician/Extender: Frann Rider in Treatment: 0 Foot Assessment Items Site Locations + = Sensation present, - = Sensation absent, C = Callus, U = Ulcer R = Redness, W = Warmth, M = Maceration, PU = Pre-ulcerative lesion F = Fissure, S = Swelling, D = Dryness Assessment Right: Left: Other Deformity: No  No Prior Foot Ulcer: No No Prior Amputation: No No Charcot Joint: No No Ambulatory Status: Ambulatory With Help Assistance Device: Walker GaitEnergy manager) Signed: 02/04/2016 10:02:59 AM By: Gretta Cool, RN, BSN, Kim RN, BSN Entered By: Gretta Cool, RN, BSN, Kim on 02/01/2016 08:35:12 [name] ([id number]) -------------------------------------------------------------------------------- Nutrition Risk Assessment Details Patient Name: [name]. Date of Service: 02/01/2016 8:00 AM Medical Record Number: [id number] Patient Account Number: [id number] Date of Birth/Sex: [birth year] (75 y.o. Female) Treating RN: Cornell Barman Primary Care Physician: Glendale Chard Other Clinician: Referring Physician: Glendale Chard Treating Physician/Extender: Frann Rider in Treatment: 0 Height (in): Weight (lbs): Body Mass Index (BMI): Nutrition Risk Assessment Items NUTRITION RISK SCREEN: I have an illness or condition that made me change the kind and/or 0 No amount of food I eat I eat fewer than two meals per day 0 No I eat few fruits and vegetables, or milk products 0 No I have three or more drinks of beer, liquor or wine almost every day 0 No I have tooth or mouth problems that make it hard for me to eat 0 No I don't always have enough money to buy the food I need 0 No I eat alone most of the time 0 No I take three or more different prescribed or over-the-counter drugs a 1 Yes day Without wanting to, I have lost or gained 10 pounds in the last six 0 No months I am not always physically able to shop, cook and/or feed myself 0 No Nutrition Protocols Good Risk Protocol 0 No interventions needed Moderate Risk Protocol Electronic Signature(s) Signed: 02/04/2016 10:02:59 AM By: Gretta Cool, RN, BSN, Kim RN, BSN Entered By: Gretta Cool, RN, BSN, Kim on 02/01/2016 08:34:04

## 2016-02-04 NOTE — Progress Notes (Signed)
NEOSHA, SWITALSKI ([id number]) Visit Report for 02/01/2016 Allergy List Details Patient Name: [name], [name]. Date of Service: 02/01/2016 8:00 AM Medical Record Number: [id number] Patient Account Number: [id number] Date of Birth/Sex: 02-14-1941 (75 y.o. Female) Treating RN: Cornell Barman Primary Care Physician: Glendale Chard Other Clinician: Referring Physician: Glendale Chard Treating Physician/Extender: Frann Rider in Treatment: 0 Allergies Active Allergies Remicade codeine Zestril Allergy Notes Electronic Signature(s) Signed: 02/04/2016 10:02:59 AM By: Gretta Cool, RN, BSN, Kim RN, BSN Entered By: Gretta Cool, RN, BSN, Kim on 02/01/2016 08:35:51 [name] ([id number]) -------------------------------------------------------------------------------- Arrival Information Details Patient Name: [name] Date of Service: 02/01/2016 8:00 AM Medical Record Number: [id number] Patient Account Number: [id number] Date of Birth/Sex: 1940-09-30 (75 y.o. Female) Treating RN: Cornell Barman Primary Care Physician: Glendale Chard Other Clinician: Referring Physician: Glendale Chard Treating Physician/Extender: Frann Rider in Treatment: 0 Visit Information Patient Arrived: [name] Time: 08:15 Accompanied By: self Transfer Assistance: None Patient Identification Verified: Yes Secondary Verification Process Completed: Yes Patient Has Alerts: Yes Patient Alerts: DM II Electronic Signature(s) Signed: 02/04/2016 10:02:59 AM By: Gretta Cool, RN, BSN, Kim RN, BSN Entered By: Gretta Cool, RN, BSN, Kim on 02/01/2016 08:16:54 [name] ([id number]) -------------------------------------------------------------------------------- Clinic Level of Care Assessment Details Patient Name: Joan, Mcdaniel. Date of Service: 02/01/2016 8:00 AM Medical Record Number: [id number] Patient Account Number: [id number] Date of Birth/Sex: [birth year] (75 y.o. Female) Treating RN: Montey Hora Primary Care Physician: Glendale Chard Other Clinician: Referring Physician: Glendale Chard Treating Physician/Extender: Frann Rider in Treatment: 0 Clinic Level of Care Assessment Items TOOL 1 Quantity Score _0  - Use when EandM and Procedure is performed on INITIAL visit 0 ASSESSMENTS - Nursing Assessment / Reassessment X - General Physical Exam (combine w/ comprehensive assessment (listed just 1 20 below) when performed on new pt. evals) X - Comprehensive Assessment (HX, ROS, Risk Assessments, Wounds Hx, etc.) 1 25 ASSESSMENTS - Wound and Skin Assessment / Reassessment _1  - Dermatologic / Skin Assessment (not related to wound area) 0 ASSESSMENTS - Ostomy and/or Continence Assessment and Care _2  - Incontinence Assessment and Management 0 _3  - Ostomy Care Assessment and Management (repouching, etc.) 0 PROCESS - Coordination of Care X - Simple Patient / Family Education for ongoing care 1 15 _4  - Complex (extensive) Patient / Family Education for ongoing care 0 X - Staff obtains Programmer, systems, Records, Test Results / Process Orders 1 10 _5  - Staff telephones HHA, Nursing Homes / Clarify orders / etc 0 _6  - Routine Transfer to another Facility (non-emergent condition) 0 _7  - Routine Hospital Admission (non-emergent condition) 0 X - New Admissions / Biomedical engineer / Ordering NPWT, Apligraf, etc. 1 15 _8  - Emergency Hospital Admission (emergent condition) 0 PROCESS - Special Needs _9  - Pediatric / Minor Patient Management 0 _10  - Isolation Patient Management 0 [name], [name] ([id number]) _11  - Hearing / Language / Visual special needs 0 _12  - Assessment of Community assistance (transportation, D/C planning, etc.) 0 _13  - Additional assistance / Altered mentation 0 _14  - Support Surface(s) Assessment (bed, cushion, seat, etc.) 0 INTERVENTIONS - Miscellaneous _15  - External ear exam 0 _16  - Patient Transfer (multiple staff / Civil Service fast streamer / Similar devices) 0 _17  -  Simple Staple / Suture removal (25 or less) 0 _18  - Complex Staple / Suture removal (26 or more) 0 _19  - Hypo/Hyperglycemic Management (do not check if billed separately) 0 X - Ankle / Brachial Index (ABI) - do not check if billed separately 1 15 Has the patient been  seen at the hospital within the last three years: Yes Total Score: 100 Level Of Care: New/Established - Level 3 Electronic Signature(s) Signed: 02/01/2016 5:02:11 PM By: Montey Hora Entered By: Montey Hora on 02/01/2016 09:09:34 [name] ([id number]) -------------------------------------------------------------------------------- Encounter Discharge Information Details Patient Name: [name]. Date of Service: 02/01/2016 8:00 AM Medical Record Number: [id number] Patient Account Number: [id number] Date of Birth/Sex: 02-08-1941 (75 y.o. Female) Treating RN: Cornell Barman Primary Care Physician: Glendale Chard Other Clinician: Referring Physician: Glendale Chard Treating Physician/Extender: Frann Rider in Treatment: 0 Encounter Discharge Information Items Discharge Pain Level: 0 Discharge Condition: Stable Ambulatory Status: Ambulatory Discharge Destination: Home Transportation: Private Auto Accompanied By: daughter Schedule Follow-up Appointment: Yes Medication Reconciliation completed and provided to Patient/Care Yes [name]: Provided on Clinical Summary of Care: 02/01/2016 Form Type Recipient Paper Patient DG Electronic Signature(s) Signed: 02/04/2016 10:02:59 AM By: Gretta Cool, RN, BSN, Kim RN, BSN Previous Signature: 02/01/2016 9:26:37 AM Version By: Ruthine Dose Entered By: Gretta Cool RN, BSN, Kim on 02/01/2016 42:68:34 [name] ([id number]) -------------------------------------------------------------------------------- Lower Extremity Assessment Details Patient Name: [name]. Date of Service: 02/01/2016 8:00 AM Medical Record Number: [id number] Patient Account Number:  [id number] Date of Birth/Sex: 10-Jan-1941 (75 y.o. Female) Treating RN: Cornell Barman Primary Care Physician: Glendale Chard Other Clinician: Referring Physician: Glendale Chard Treating Physician/Extender: Frann Rider in Treatment: 0 Edema Assessment Assessed: [Left: No] [Right: No] Edema: [Left: Yes] [Right: Yes] Calf Left: Right: Point of Measurement: 34 cm From Medial Instep 49.5 cm 50 cm Ankle Left: Right: Point of Measurement: 11 cm From Medial Instep 32 cm 34.5 cm Vascular Assessment Pulses: Posterior Tibial Palpable: [Left:No] [Right:No] Doppler: [Left:Inaudible] [Right:Inaudible] Dorsalis Pedis Palpable: [Left:Yes] [Right:Yes] Doppler: [Right:Multiphasic] Extremity colors, hair growth, and conditions: Extremity Color: [Left:Hyperpigmented] [Right:Normal] Hair Growth on Extremity: [Left:No] [Right:No] Temperature of Extremity: [Left:Cool] [Right:Cool] Capillary Refill: [Left:< 3 seconds] [Right:< 3 seconds] Dependent Rubor: [Left:No] [Right:No] Blanched when Elevated: [Left:No] [Right:No] Lipodermatosclerosis: [Left:No] [Right:No] Blood Pressure: Brachial: [Left:120] [Right:128] Dorsalis Pedis: 148 [Left:Dorsalis Pedis: 408] Ankle: Posterior Tibial: [Left:Posterior Tibial: 1.16] [Right:1.08] Toe Nail Assessment Left: Right: Thick: No No [name], [name] Kitchen ([id number]) Discolored: No No Deformed: No No Improper Length and Hygiene: No No Electronic Signature(s) Signed: 02/04/2016 10:02:59 AM By: Gretta Cool, RN, BSN, Kim RN, BSN Entered By: Gretta Cool, RN, BSN, Kim on 02/01/2016 08:48:48 [name] ([id number]) -------------------------------------------------------------------------------- Multi Wound Chart Details Patient Name: [name]. Date of Service: 02/01/2016 8:00 AM Medical Record Number: [id number] Patient Account Number: [id number] Date of Birth/Sex: 29-Dec-1940 (75 y.o. 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 N/A N/A Comorbid History: Cataracts, Lymphedema, N/A N/A Hypertension, Type II Diabetes, Gout, Rheumatoid Arthritis, Osteoarthritis, Neuropathy Date Acquired: 12/27/2015 N/A N/A Weeks of Treatment: 0 N/A N/A Wound Status: Open N/A N/A Measurements L x W x D 6x4.2x0.2 N/A N/A (cm) Area (cm) : 19.792 N/A N/A Volume (cm) : 3.958 N/A N/A Classification: Grade 2 N/A N/A Exudate Amount: Medium N/A N/A Exudate Type: Sanguinous N/A N/A Exudate Color: red N/A N/A Wound Margin: Distinct, outline attached N/A N/A Granulation Amount: Small (1-33%) N/A N/A Granulation Quality: Red, Pink N/A N/A [name],  [name] Kitchen ([id number]) Necrotic Amount: Large (67-100%) N/A N/A Necrotic Tissue: Eschar, Adherent Slough N/A N/A Exposed Structures: Fascia: No N/A N/A Fat: No Tendon: No Muscle: No Joint: No Bone: No Limited to Skin Breakdown Epithelialization: Small (1-33%) N/A N/A Periwound Skin Texture: Edema: Yes N/A N/A Induration: Yes Excoriation: No Callus: No Crepitus: No Fluctuance: No Friable: No Rash: No Scarring: No Periwound Skin Maceration: No N/A N/A Moisture: Moist: No Dry/Scaly: No Periwound Skin Color: Erythema: Yes N/A N/A Atrophie Blanche: No Cyanosis: No Ecchymosis: No Hemosiderin Staining: No Mottled: No Pallor: No Rubor: No Erythema Location: Circumferential N/A N/A Temperature: No Abnormality N/A N/A Tenderness on Yes N/A N/A Palpation: Wound Preparation: Ulcer  Cleansing: N/A N/A Rinsed/Irrigated with Saline Topical Anesthetic Applied: Other: lidocaine 4% Treatment Notes Electronic Signature(s) Signed: 02/01/2016 5:02:11 PM By: Montey Hora Entered By: Montey Hora on 02/01/2016 09:02:57 [name] ([id number]) [name] ([id number]) -------------------------------------------------------------------------------- [place] Details Patient Name: CANNA, NICKELSON. Date of Service: 02/01/2016 8:00 AM Medical Record Number: [id number] Patient Account Number: [id number] Date of Birth/Sex: 08-28-1940 (75 y.o. Female) Treating RN: Montey Hora Primary Care Physician: Glendale Chard Other Clinician: Referring Physician: Glendale Chard Treating Physician/Extender: Frann Rider in Treatment: 0 Active Inactive Abuse / Safety / Falls / Self Care Management Nursing Diagnoses: Impaired physical mobility Potential for falls Goals: Patient will remain injury free Date Initiated: 02/01/2016 Goal Status: Active Interventions: Assess fall risk on admission and as needed Notes: Orientation to the Wound Care Program Nursing Diagnoses: Knowledge deficit related to the wound healing center program Goals: Patient/caregiver will verbalize understanding of the [place] Program Date Initiated: 02/01/2016 Goal Status: Active Interventions: Provide education on orientation to the wound center Notes: Wound/Skin Impairment Nursing Diagnoses: Impaired tissue integrity Goals: Patient/caregiver will verbalize understanding of skin care regimen [name], [name] ([id number]) Date Initiated: 02/01/2016 Goal Status: Active Ulcer/skin breakdown will have a volume reduction of 30% by week 4 Date Initiated: 02/01/2016 Goal Status: Active Ulcer/skin breakdown will have a volume reduction of 50% by week 8 Date Initiated: 02/01/2016 Goal Status: Active Ulcer/skin breakdown will have a volume reduction  of 80% by week 12 Date Initiated: 02/01/2016 Goal Status: Active Ulcer/skin breakdown will heal within 14 weeks Date Initiated: 02/01/2016 Goal Status: Active Interventions: Assess patient/caregiver ability to obtain necessary supplies Assess patient/caregiver ability to perform ulcer/skin care regimen upon admission and as needed Assess ulceration(s) every visit Notes: Electronic Signature(s) Signed: 02/01/2016 5:02:11 PM By: Montey Hora Entered By: Montey Hora on 02/01/2016 09:02:01 [name] ([id number]) -------------------------------------------------------------------------------- Pain Assessment Details Patient Name: [name]. Date of Service: 02/01/2016 8:00 AM Medical Record Number: [id number] Patient Account Number: [id number] Date of Birth/Sex: [birth year] (75 y.o. Female) Treating RN: Cornell Barman Primary Care Physician: Glendale Chard Other Clinician: Referring Physician: Glendale Chard Treating Physician/Extender: Frann Rider in Treatment: 0 Active Problems Location of Pain Severity and Description of Pain Patient Has Paino No Site Locations With Dressing Change: No Pain Management and Medication Current Pain Management: Electronic Signature(s) Signed: 02/04/2016 10:02:59 AM By: Gretta Cool, RN, BSN, Kim RN, BSN Entered By: Gretta Cool, RN, BSN, Kim on 02/01/2016 08:17:04 [name] ([id number]) -------------------------------------------------------------------------------- Patient/Caregiver Education Details Patient Name: [name] Date of Service: 02/01/2016 8:00 AM Medical Record Number: [id number] Patient Account Number: [id number] Date of Birth/Gender: 1940-07-29 (75 y.o. Female) Treating RN: Cornell Barman Primary Care Physician: Glendale Chard Other Clinician: Referring Physician: Glendale Chard Treating Physician/Extender: Frann Rider in Treatment: 0 Education Assessment Education Provided To: Patient  Education  Topics Provided Venous: Handouts: Controlling Swelling with Compression Stockings , Other: elevate Methods: Demonstration Responses: State content correctly Wound/Skin Impairment: Handouts: Caring for Your Ulcer, Other: wound care as prescribed Methods: Demonstration, Explain/Verbal Responses: State content correctly Electronic Signature(s) Signed: 02/04/2016 10:02:59 AM By: Gretta Cool, RN, BSN, Kim RN, BSN Entered By: Gretta Cool, RN, BSN, Kim on 02/01/2016 09:29:01 [name] ([id number]) -------------------------------------------------------------------------------- Wound Assessment Details Patient Name: [name]. Date of Service: 02/01/2016 8:00 AM Medical Record Number: [id number] Patient Account Number: [id number] Date of Birth/Sex: 1940-07-12 (75 y.o. Female) Treating RN: Cornell Barman Primary Care Physician: Glendale Chard Other Clinician: Referring Physician: Glendale Chard Treating Physician/Extender: Frann Rider in Treatment: 0 Wound Status Wound Number: 1 Primary Diabetic Wound/Ulcer of the Lower Etiology: Extremity Wound Location: Left Lower Leg - Lateral Secondary Auto-immune Wounding Event: Trauma Etiology: Date Acquired: 12/27/2015 Wound Open Weeks Of Treatment: 0 Status: Clustered Wound: No Comorbid Cataracts, Lymphedema, History: Hypertension, Type II Diabetes, Gout, Rheumatoid Arthritis, Osteoarthritis, Neuropathy Photos Wound Measurements Length: (cm) 6 Width: (cm) 4.2 Depth: (cm) 0.2 Area: (cm) 19.792 Volume: (cm) 3.958 % Reduction in Area: % Reduction in Volume: Epithelialization: Small (1-33%) Tunneling: No Undermining: No Wound Description Classification: Grade 2 Wound Margin: Distinct, outline attached Exudate Amount: Medium Exudate Type: Sanguinous Exudate Color: red Wound Bed Granulation Amount: Small (1-33%) Exposed Structure Granulation Quality: Red, Pink Fascia Exposed: No Necrotic Amount: Large (67-100%) Fat  Layer Exposed: No Necrotic Quality: Eschar, Adherent Slough Tendon Exposed: No [name], [name] ([id number]) Muscle Exposed: No Joint Exposed: No Bone Exposed: No Limited to Skin Breakdown Periwound Skin Texture Texture Color No Abnormalities Noted: No No Abnormalities Noted: No Callus: No Atrophie Blanche: No Crepitus: No Cyanosis: No Excoriation: No Ecchymosis: No Fluctuance: No Erythema: Yes Friable: No Erythema Location: Circumferential Induration: Yes Hemosiderin Staining: No Localized Edema: Yes Mottled: No Rash: No Pallor: No Scarring: No Rubor: No Moisture Temperature / Pain No Abnormalities Noted: No Temperature: No Abnormality Dry / Scaly: No Tenderness on Palpation: Yes Maceration: No Moist: No Wound Preparation Ulcer Cleansing: Rinsed/Irrigated with Saline Topical Anesthetic Applied: Other: lidocaine 4%, Treatment Notes Wound #1 (Left, Lateral Lower Leg) 1. Cleansed with: Clean wound with Normal Saline 2. Anesthetic Topical Lidocaine 4% cream to wound bed prior to debridement 4. Dressing Applied: Santyl Ointment 5. Secondary [place] Signature(s) Signed: 02/04/2016 10:02:59 AM By: Gretta Cool, RN, BSN, Kim RN, BSN Entered By: Gretta Cool, RN, BSN, Kim on 02/01/2016 08:52:11 [name] ([id number]) -------------------------------------------------------------------------------- Vitals Details Patient Name: [name] Date of Service: 02/01/2016 8:00 AM Medical Record Number: [id number] Patient Account Number: [id number] Date of Birth/Sex: 1940-09-15 (75 y.o. Female) Treating RN: Cornell Barman Primary Care Physician: Glendale Chard Other Clinician: Referring Physician: Glendale Chard Treating Physician/Extender: Frann Rider in Treatment: 0 Vital Signs Time Taken: 08:17 Temperature (F): 97.8 Pulse (bpm): 88 Respiratory Rate (breaths/min): 18 Blood Pressure (mmHg): 138/78 Reference Range: 80 -  120 mg / dl Electronic Signature(s) Signed: 02/04/2016 10:02:59 AM By: Gretta Cool, RN, BSN, Kim RN, BSN Entered By: Gretta Cool, RN, BSN, Kim on 02/01/2016 08:17:59

## 2016-02-05 DIAGNOSIS — E1129 Type 2 diabetes mellitus with other diabetic kidney complication: Secondary | ICD-10-CM | POA: Diagnosis not present

## 2016-02-05 DIAGNOSIS — T798XXD Other early complications of trauma, subsequent encounter: Secondary | ICD-10-CM | POA: Diagnosis not present

## 2016-02-05 DIAGNOSIS — I130 Hypertensive heart and chronic kidney disease with heart failure and stage 1 through stage 4 chronic kidney disease, or unspecified chronic kidney disease: Secondary | ICD-10-CM | POA: Diagnosis not present

## 2016-02-05 DIAGNOSIS — I5032 Chronic diastolic (congestive) heart failure: Secondary | ICD-10-CM | POA: Diagnosis not present

## 2016-02-05 DIAGNOSIS — E1140 Type 2 diabetes mellitus with diabetic neuropathy, unspecified: Secondary | ICD-10-CM | POA: Diagnosis not present

## 2016-02-05 DIAGNOSIS — S81812D Laceration without foreign body, left lower leg, subsequent encounter: Secondary | ICD-10-CM | POA: Diagnosis not present

## 2016-02-06 ENCOUNTER — Encounter: Payer: Medicare Other | Attending: Physical Medicine & Rehabilitation | Admitting: Registered Nurse

## 2016-02-06 ENCOUNTER — Encounter: Payer: Self-pay | Admitting: Registered Nurse

## 2016-02-06 VITALS — BP 112/73 | HR 99 | Resp 17

## 2016-02-06 DIAGNOSIS — Z79899 Other long term (current) drug therapy: Secondary | ICD-10-CM | POA: Diagnosis not present

## 2016-02-06 DIAGNOSIS — I5032 Chronic diastolic (congestive) heart failure: Secondary | ICD-10-CM | POA: Diagnosis not present

## 2016-02-06 DIAGNOSIS — M4806 Spinal stenosis, lumbar region: Secondary | ICD-10-CM | POA: Insufficient documentation

## 2016-02-06 DIAGNOSIS — M47816 Spondylosis without myelopathy or radiculopathy, lumbar region: Secondary | ICD-10-CM

## 2016-02-06 DIAGNOSIS — M5416 Radiculopathy, lumbar region: Secondary | ICD-10-CM | POA: Diagnosis not present

## 2016-02-06 DIAGNOSIS — M069 Rheumatoid arthritis, unspecified: Secondary | ICD-10-CM | POA: Insufficient documentation

## 2016-02-06 DIAGNOSIS — E1140 Type 2 diabetes mellitus with diabetic neuropathy, unspecified: Secondary | ICD-10-CM

## 2016-02-06 DIAGNOSIS — M7062 Trochanteric bursitis, left hip: Secondary | ICD-10-CM | POA: Diagnosis not present

## 2016-02-06 DIAGNOSIS — T798XXD Other early complications of trauma, subsequent encounter: Secondary | ICD-10-CM | POA: Diagnosis not present

## 2016-02-06 DIAGNOSIS — M62838 Other muscle spasm: Secondary | ICD-10-CM

## 2016-02-06 DIAGNOSIS — G894 Chronic pain syndrome: Secondary | ICD-10-CM | POA: Diagnosis not present

## 2016-02-06 DIAGNOSIS — Z5181 Encounter for therapeutic drug level monitoring: Secondary | ICD-10-CM | POA: Insufficient documentation

## 2016-02-06 DIAGNOSIS — E1129 Type 2 diabetes mellitus with other diabetic kidney complication: Secondary | ICD-10-CM | POA: Diagnosis not present

## 2016-02-06 DIAGNOSIS — M1711 Unilateral primary osteoarthritis, right knee: Secondary | ICD-10-CM | POA: Diagnosis not present

## 2016-02-06 DIAGNOSIS — I130 Hypertensive heart and chronic kidney disease with heart failure and stage 1 through stage 4 chronic kidney disease, or unspecified chronic kidney disease: Secondary | ICD-10-CM | POA: Diagnosis not present

## 2016-02-06 DIAGNOSIS — M1712 Unilateral primary osteoarthritis, left knee: Secondary | ICD-10-CM | POA: Diagnosis not present

## 2016-02-06 DIAGNOSIS — S81812D Laceration without foreign body, left lower leg, subsequent encounter: Secondary | ICD-10-CM | POA: Diagnosis not present

## 2016-02-06 MED ORDER — MORPHINE SULFATE 15 MG PO TABS: 15.0000 mg | ORAL_TABLET | Freq: Three times a day (TID) | ORAL | 0 refills | Status: DC | PRN

## 2016-02-06 MED ORDER — CYCLOBENZAPRINE HCL 5 MG PO TABS: 5.0000 mg | ORAL_TABLET | Freq: Every evening | ORAL | 1 refills | Status: DC | PRN

## 2016-02-06 MED ORDER — GABAPENTIN 600 MG PO TABS: ORAL_TABLET | ORAL | 0 refills | Status: DC

## 2016-02-06 MED ORDER — MORPHINE SULFATE ER 15 MG PO TBCR: 15.0000 mg | EXTENDED_RELEASE_TABLET | Freq: Two times a day (BID) | ORAL | 0 refills | Status: DC

## 2016-02-06 NOTE — Progress Notes (Signed)
Subjective:    Patient ID: [name], female    DOB: 04-Jan-1941, 75 y.o.   MRN: [id number]  HPI: [name] is a 75 year old female who returns for follow up appointment and medication refill. She states her pain is located in her lower back radiating into her left hip and bilateral knees. She did not rate her pain today.  Receiving Wound care from the [facility] changing her left lower extremity dressing daily.   Pain Inventory Average Pain 6 Pain Right Now NA My pain is NA  In the last 24 hours, has pain interfered with the following? General activity 8 Relation with others 8 Enjoyment of life NA What TIME of day is your pain at its worst? morning Sleep (in general) NA  Pain is worse with: walking, bending, sitting and standing Pain improves with: rest, heat/ice and medication Relief from Meds: NA  Mobility use a walker ability to climb steps?  no  Function retired I need assistance with the following:  household duties and shopping  Neuro/Psych bladder control problems  Prior Studies Any changes since last visit?  no  Physicians involved in your care Any changes since last visit?  no   Family History  Problem Relation Age of Onset  . Diabetes Mother   . Heart attack Mother   . Hypertension Father   . Lung cancer Father   . Diabetes Sister   . Diabetes Brother   . Hypertension Brother   . Heart disease Brother   . Heart attack Brother   . Kidney cancer Brother   . Uterine cancer Daughter   . Breast cancer Sister   . Rheum arthritis Maternal Uncle   . Gout Brother   . Kidney failure Brother     x 5   Social History   Social History  . Marital status: Married    Spouse name: N/A  . Number of children: 6  . Years of education: college   Occupational History  . retired Marine scientist    Social History Main Topics  . Smoking status: Never Smoker  . Smokeless tobacco: Never Used  . Alcohol  use No  . Drug use: No  . Sexual activity: Not Currently   Other Topics Concern  . None   Social History Narrative   Patient consumes 2-3 cups coffee per day.   Past Surgical History:  Procedure Laterality Date  . ABDOMINAL HYSTERECTOMY    . BRAIN SURGERY     Gamma knife 10/13. Needs repeat spring  '14  . CARDIAC CATHETERIZATION N/A 10/31/2014   Procedure: Right/Left Heart Cath and Coronary Angiography;  Surgeon: [name], MD;  Location: [facility];  Service: Cardiovascular;  Laterality: N/A;  . ESOPHAGOGASTRODUODENOSCOPY (EGD) WITH PROPOFOL N/A 09/14/2014   Procedure: ESOPHAGOGASTRODUODENOSCOPY (EGD) WITH PROPOFOL;  Surgeon: [name], MD;  Location: [facility];  Service: Endoscopy;  Laterality: N/A;  . OVARY SURGERY    . SHOULDER SURGERY Left   . TONSILLECTOMY  age 60  . VIDEO BRONCHOSCOPY Bilateral 05/31/2013   Procedure: VIDEO BRONCHOSCOPY WITHOUT FLUORO;  Surgeon: Brand Males, MD;  Location: [facility];  Service: Cardiopulmonary;  Laterality: Bilateral;  . video bronscoscopy  2000   lung   Past Medical History:  Diagnosis Date  . Arthritis    endstage changes bilateral knees/bilateral ankles.   . Asthma   . Carotid artery occlusion   . Chronic kidney disease   .  Clotting disorder ([place])    pt denies this  . Contusion of left knee    due to fall 1/14.  [name] COPD (chronic obstructive pulmonary disease) (HCC)    pulmonary fibrosis  . Depression, reactive   . Diabetes mellitus   . Diastolic dysfunction   . Family history of heart disease   . High cholesterol   . Hypertension   . Interstitial lung disease ([place])   . 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    . Morbid obesity ([place])   . Normal coronary arteries    cardiac catheterization performed  10/31/14  . RA (rheumatoid arthritis) ([place])    has been off methotreaxte since 10/13.  [name] Shortness of  breath   . Thyroid disease   . URI (upper respiratory infection)    BP 112/73   Pulse 99   Resp 17   SpO2 94%   Opioid Risk Score:   Fall Risk Score:  `1  Depression screen PHQ 2/9  Depression screen [facility] 2/9 08/09/2015 01/05/2015 09/20/2014 08/10/2013  Decreased Interest _0 -  Down, Depressed, Hopeless _1 PHQ - 2 Score _2 Altered sleeping - 2 0 -  Tired, decreased energy - 2 3 -  Change in appetite - 2 3 -  Feeling bad or failure about yourself  - 0 3 -  Trouble concentrating - 1 1 -  Moving slowly or fidgety/restless - 0 2 -  Suicidal thoughts - 0 0 -  PHQ-9 Score - 10 16 -  Difficult doing work/chores - Somewhat difficult - -  Some recent data might be hidden    Review of Systems  Constitutional:       Bladder control problems   Respiratory: Positive for shortness of breath.   Cardiovascular:       Limb swelling   Gastrointestinal: Positive for constipation and nausea.  Skin: Positive for rash.  All other systems reviewed and are negative.      Objective:   Physical Exam  Constitutional: She is oriented to person, place, and time. She appears well-developed and well-nourished.  HENT:  Head: Normocephalic and atraumatic.  Neck: Normal range of motion. Neck supple.  Cardiovascular: Normal rate and regular rhythm.   Pulmonary/Chest: Effort normal and breath sounds normal.  Musculoskeletal:  Normal Muscle Bulk and Muscle Testing Reveals: Upper Extremities: Full ROM and Muscle Strength 5/5 Back without spinal tenderness Lower Extremities: Full ROM and Muscle Strength 5/5 Arises from table slowly using walker for support Narrow Based Gait   Neurological: She is alert and oriented to person, place, and time.  Skin: Skin is warm and dry.  Psychiatric: She has a normal mood and affect.  Nursing note and vitals reviewed.         Assessment & Plan:  1. Lumbar spinal stenosis with neurogenic claudication. Associated facet arthropathy: Continue  Gabapentin and HEP as tolerated. Refilled : MS Contin 15 mg one tablet every 12 hours #60 and Decreased:  MSIR 15 mg 1 one tablet every 8 hours as needed #90( Post Dated) We will continue the opioid monitoring program, this consists of regular clinic visits, examinations, urine drug screen, pill counts as well as use of [place] Controlled Substance reporting System. 2. Depression: Continue Cymbalta 3. Diabetes mellitus type 2 with polyneuropathy: Continue Gabapentin 4. Rheumatoid arthritis and osteoarthritis. Contnue Voltaren Gel 5. Interstitial lung disease:  Continuous Oxygen and Pulmonology Following.   6. Bilateral Osteoarthritis Knee's: Continue Voltaren Gel 7. Left Greater Trochanteric Bursitis: Continue Voltaren Gel, Heat and Ice Therapy 8. Muscle Spasm: RX: Flexeril  30 minutes of face to face patient care time was spent during this visit. All questions were encouraged and answered.  F/U in 1 month

## 2016-02-08 ENCOUNTER — Encounter: Payer: Medicare Other | Admitting: Surgery

## 2016-02-08 DIAGNOSIS — E11622 Type 2 diabetes mellitus with other skin ulcer: Secondary | ICD-10-CM | POA: Diagnosis not present

## 2016-02-08 DIAGNOSIS — L97821 Non-pressure chronic ulcer of other part of left lower leg limited to breakdown of skin: Secondary | ICD-10-CM | POA: Diagnosis not present

## 2016-02-08 DIAGNOSIS — J449 Chronic obstructive pulmonary disease, unspecified: Secondary | ICD-10-CM | POA: Diagnosis not present

## 2016-02-08 DIAGNOSIS — L97222 Non-pressure chronic ulcer of left calf with fat layer exposed: Secondary | ICD-10-CM | POA: Diagnosis not present

## 2016-02-08 DIAGNOSIS — E1122 Type 2 diabetes mellitus with diabetic chronic kidney disease: Secondary | ICD-10-CM | POA: Diagnosis not present

## 2016-02-08 DIAGNOSIS — N189 Chronic kidney disease, unspecified: Secondary | ICD-10-CM | POA: Diagnosis not present

## 2016-02-08 DIAGNOSIS — I890 Lymphedema, not elsewhere classified: Secondary | ICD-10-CM | POA: Diagnosis not present

## 2016-02-09 DIAGNOSIS — E1140 Type 2 diabetes mellitus with diabetic neuropathy, unspecified: Secondary | ICD-10-CM | POA: Diagnosis not present

## 2016-02-09 DIAGNOSIS — I5032 Chronic diastolic (congestive) heart failure: Secondary | ICD-10-CM | POA: Diagnosis not present

## 2016-02-09 DIAGNOSIS — T798XXD Other early complications of trauma, subsequent encounter: Secondary | ICD-10-CM | POA: Diagnosis not present

## 2016-02-09 DIAGNOSIS — E1129 Type 2 diabetes mellitus with other diabetic kidney complication: Secondary | ICD-10-CM | POA: Diagnosis not present

## 2016-02-09 DIAGNOSIS — I130 Hypertensive heart and chronic kidney disease with heart failure and stage 1 through stage 4 chronic kidney disease, or unspecified chronic kidney disease: Secondary | ICD-10-CM | POA: Diagnosis not present

## 2016-02-09 DIAGNOSIS — S81812D Laceration without foreign body, left lower leg, subsequent encounter: Secondary | ICD-10-CM | POA: Diagnosis not present

## 2016-02-09 NOTE — Progress Notes (Signed)
MARLIN, BRYS ([id number]) Visit Report for 02/08/2016 Chief Complaint Document Details Patient Name: [name], [name]. Date of Service: 02/08/2016 1:30 PM Medical Record Number: [id number] Patient Account Number: [id number] Date of Birth/Sex: 02-09-1941 (75 y.o. Female) Treating RN: Cornell Barman Primary Care Physician: Glendale Chard Other Clinician: Referring Physician: Glendale Chard Treating Physician/Extender: Frann Rider in Treatment: 1 Information Obtained from: Patient Chief Complaint Patients presents for treatment of an open diabetic ulcer the left lower extremity which she's had for about 4 weeks Electronic Signature(s) Signed: 02/08/2016 2:29:38 PM By: Christin Fudge MD, FACS Entered By: Christin Fudge on 02/08/2016 14:29:38 [name] ([id number]) -------------------------------------------------------------------------------- Debridement Details Patient Name: [name]. Date of Service: 02/08/2016 1:30 PM Medical Record Number: [id number] Patient Account Number: [id number] Date of Birth/Sex: 1940/08/11 (75 y.o. Female) Treating RN: Cornell Barman Primary Care Physician: Glendale Chard Other Clinician: Referring Physician: Glendale Chard Treating Physician/Extender: Frann Rider in Treatment: 1 Debridement Performed for Wound #1 Left,Lateral Lower Leg Assessment: Performed By: Physician Christin Fudge, MD Debridement: Debridement Pre-procedure Yes - 14:02 Verification/Time Out Taken: Start Time: 14:03 Pain Control: Other : lidocaine 4% Level: Skin/Subcutaneous Tissue Total Area Debrided (L x 6 (cm) x 5 (cm) = 30 (cm) W): Tissue and other Viable, Non-Viable, Eschar, Exudate, Fibrin/Slough, Subcutaneous material debrided: Instrument: Curette Bleeding: Minimum Hemostasis Achieved: Pressure End Time: 14:12 Procedural Pain: 0 Post Procedural Pain: 0 Response to Treatment: Procedure was tolerated well Post Debridement Measurements  of Total Wound Length: (cm) 6 Width: (cm) 5 Depth: (cm) 0.6 Volume: (cm) 14.137 Character of Wound/Ulcer Post Requires Further Debridement Debridement: Severity of Tissue Post Debridement: Fat layer exposed Post Procedure Diagnosis Same as Pre-procedure Electronic Signature(s) Signed: 02/08/2016 2:29:32 PM By: Christin Fudge MD, FACS Signed: 02/08/2016 4:13:53 PM By: Gretta Cool RN, BSN, Kim RN, BSN Entered By: Christin Fudge on 02/08/2016 14:29:32 [name], [name] ([id number] Mcdaniel, [name] ([id number]) -------------------------------------------------------------------------------- HPI Details Patient Name: [name]. Date of Service: 02/08/2016 1:30 PM Medical Record Number: [id number] Patient Account Number: [id number] Date of Birth/Sex: 02-Aug-1940 (75 y.o. Female) Treating RN: Cornell Barman Primary Care Physician: Glendale Chard Other Clinician: Referring Physician: Glendale Chard Treating Physician/Extender: Frann Rider in Treatment: 1 History of Present Illness Location: left lower extremity lateral calf Quality: Patient reports experiencing a dull pain to affected area(s). Severity: Patient states wound are getting worse. Duration: Patient has had the wound for < 4 weeks prior to presenting for treatment Timing: Pain in wound is Intermittent (comes and goes Context: The wound occurred when the patient was injured with a sharp corner for car door Modifying Factors: Other treatment(s) tried include:local care and 3 courses of antibiotics including Keflex, Bactrim and doxycycline Associated Signs and Symptoms: Patient reports having increase swelling. HPI Description: 75 year old patient with a past medical history of diabetes mellitus, chronic kidney disease, COPD, hypertension, rheumatoid arthritis had a lacerated wound to her left calf when she injured it on the corner of a car door and it was a long 12 cm laceration. x-ray done at the time revealed that  there was no fracture or radiopaque foreign body. the wound was sutured with 4-0 Prolene simple interrupted sutures and a total of 14 sutures were applied. on August 31 she was seen in the ER and it was noted that the wound had dehisced and she was treated with Keflex. She has completed a course of doxycycline recently Medical history also significant for status post abdominal hysterectomy, brain surgery, cardiac catheterization, EGD and bronchoscopy. of late she  was back in the ER and was seen last on August 31 and they tried Immunologist) Signed: 02/08/2016 2:29:47 PM By: Christin Fudge MD, FACS Entered By: Christin Fudge on 02/08/2016 14:29:47 [name] ([id number]) -------------------------------------------------------------------------------- Physical Exam Details Patient Name: [name]. Date of Service: 02/08/2016 1:30 PM Medical Record Number: [id number] Patient Account Number: [id number] Date of Birth/Sex: 1940/10/20 (75 y.o. Female) Treating RN: Cornell Barman Primary Care Physician: Glendale Chard Other Clinician: Referring Physician: Glendale Chard Treating Physician/Extender: Frann Rider in Treatment: 1 Constitutional . Pulse regular. Respirations normal and unlabored. Afebrile. . Eyes Nonicteric. Reactive to light. Ears, Nose, Mouth, and Throat Lips, teeth, and gums WNL.[name] Moist mucosa without lesions. Neck supple and nontender. No palpable supraclavicular or cervical adenopathy. Normal sized without goiter. Respiratory WNL. No retractions.. Cardiovascular Pedal Pulses WNL. No clubbing, cyanosis or edema. Lymphatic No adneopathy. No adenopathy. No adenopathy. Musculoskeletal Adexa without tenderness or enlargement.. Digits and nails w/o clubbing, cyanosis, infection, petechiae, ischemia, or inflammatory conditions.. Integumentary (Hair, Skin) No suspicious lesions. No crepitus or fluctuance. No peri-wound warmth or erythema.  No masses.[name] Psychiatric Judgement and insight Intact.. No evidence of depression, anxiety, or agitation.. Notes the lacerated wound on the left lateral leg continues to have a lot of necrotic debris and using a sharp dissection with a #3 curet I was able to debride a lot of this. Bleeding controlled with pressure. Electronic Signature(s) Signed: 02/08/2016 2:30:33 PM By: Christin Fudge MD, FACS Entered By: Christin Fudge on 02/08/2016 14:30:32 [name] ([id number]) -------------------------------------------------------------------------------- Physician Orders Details Patient Name: [name]. Date of Service: 02/08/2016 1:30 PM Medical Record Number: [id number] Patient Account Number: [id number] Date of Birth/Sex: 1941/04/28 (75 y.o. Female) Treating RN: Cornell Barman Primary Care Physician: Glendale Chard Other Clinician: Referring Physician: Glendale Chard Treating Physician/Extender: Frann Rider in Treatment: 1 Verbal / Phone Orders: Yes Clinician: Cornell Barman Read Back and Verified: Yes Diagnosis Coding Wound Cleansing Wound #1 Left,Lateral Lower Leg o Clean wound with Normal Saline. o May Shower, gently pat wound dry prior to applying new dressing. Anesthetic Wound #1 Left,Lateral Lower Leg o Topical Lidocaine 4% cream applied to wound bed prior to debridement Skin Barriers/Peri-Wound Care Wound #1 Left,Lateral Lower Leg o Skin Prep Primary Wound Dressing Wound #1 Left,Lateral Lower Leg o Santyl Ointment Secondary Dressing Wound #1 Left,Lateral Lower Leg o Boardered Foam Dressing Dressing Change Frequency Wound #1 Left,Lateral Lower Leg o Change dressing every day. Follow-up Appointments Wound #1 Left,Lateral Lower Leg o Return Appointment in 1 week. Edema Control Wound #1 Left,Lateral Lower Leg o Patient to wear own compression stockings [place] ([id number]) Wound #1 Left,Lateral Lower Leg o  [place] Nurse may visit PRN to address patientos wound care needs. o FACE TO FACE ENCOUNTER: MEDICARE and MEDICAID PATIENTS: I certify that this patient is under my care and that I had a face-to-face encounter that meets the physician face-to-face encounter requirements with this patient on this date. The encounter with the patient was in whole or in part for the following MEDICAL CONDITION: (primary reason for [place]) MEDICAL NECESSITY: I certify, that based on my findings, NURSING services are a medically necessary home health service. HOME BOUND STATUS: I certify that my clinical findings support that this patient is homebound (i.e., Due to illness or injury, pt requires aid of supportive devices such as crutches, cane, wheelchairs, walkers, the use of special transportation or the assistance of another person to leave  their place of residence. There is a normal inability to leave the home and doing so requires considerable and taxing effort. Other absences are for medical reasons / religious services and are infrequent or of short duration when for other reasons). o If current dressing causes regression in wound condition, may D/C ordered dressing product/s and apply Normal Saline Moist Dressing daily until next [place] / Other MD appointment. [place] of regression in wound condition at [phone]. o Please direct any NON-WOUND related issues/requests for orders to patient's Primary Care Physician Medications-please add to medication list. Wound #1 Left,Lateral Lower Leg o Santyl Enzymatic Ointment Electronic Signature(s) Signed: 02/08/2016 3:52:17 PM By: Christin Fudge MD, FACS Signed: 02/08/2016 4:13:53 PM By: Gretta Cool RN, BSN, Kim RN, BSN Entered By: Gretta Cool, RN, BSN, Kim on 02/08/2016 14:14:24 [name]  ([id number]) -------------------------------------------------------------------------------- Problem List Details Patient Name: [name], [name]. Date of Service: 02/08/2016 1:30 PM Medical Record Number: [id number] Patient Account Number: [id number] Date of Birth/Sex: 19-Feb-1941 (75 y.o. Female) Treating RN: Cornell Barman Primary Care Physician: Glendale Chard Other Clinician: Referring Physician: Glendale Chard Treating Physician/Extender: Frann Rider in Treatment: 1 Active Problems ICD-10 Encounter Code Description Active Date Diagnosis E11.622 Type 2 diabetes mellitus with other skin ulcer 02/01/2016 Yes L97.222 Non-pressure chronic ulcer of left calf with fat layer 02/01/2016 Yes exposed I89.0 Lymphedema, not elsewhere classified 02/01/2016 Yes Inactive Problems Resolved Problems Electronic Signature(s) Signed: 02/08/2016 2:29:17 PM By: Christin Fudge MD, FACS Entered By: Christin Fudge on 02/08/2016 14:29:17 [name] ([id number]) -------------------------------------------------------------------------------- Progress Note Details Patient Name: [name]. Date of Service: 02/08/2016 1:30 PM Medical Record Number: [id number] Patient Account Number: [id number] Date of Birth/Sex: [birth year] (75 y.o. Female) Treating RN: Cornell Barman Primary Care Physician: Glendale Chard Other Clinician: Referring Physician: Glendale Chard Treating Physician/Extender: Frann Rider in Treatment: 1 Subjective Chief Complaint Information obtained from Patient Patients presents for treatment of an open diabetic ulcer the left lower extremity which she's had for about 4 weeks History of Present Illness (HPI) The following HPI elements were documented for the patient's wound: Location: left lower extremity lateral calf Quality: Patient reports experiencing a dull pain to affected area(s). Severity: Patient states wound are getting worse. Duration: Patient has had the  wound for < 4 weeks prior to presenting for treatment Timing: Pain in wound is Intermittent (comes and goes Context: The wound occurred when the patient was injured with a sharp corner for car door Modifying Factors: Other treatment(s) tried include:local care and 3 courses of antibiotics including Keflex, Bactrim and doxycycline Associated Signs and Symptoms: Patient reports having increase swelling. 75 year old patient with a past medical history of diabetes mellitus, chronic kidney disease, COPD, hypertension, rheumatoid arthritis had a lacerated wound to her left calf when she injured it on the corner of a car door and it was a long 12 cm laceration. x-ray done at the time revealed that there was no fracture or radiopaque foreign body. the wound was sutured with 4-0 Prolene simple interrupted sutures and a total of 14 sutures were applied. on August 31 she was seen in the ER and it was noted that the wound had dehisced and she was treated with Keflex. She has completed a course of doxycycline recently Medical history also significant for status post abdominal hysterectomy, brain surgery, cardiac catheterization, EGD and bronchoscopy. of late she was back in the ER and was seen last on August 31 and they tried Silvadene Objective Constitutional Pulse regular. Respirations normal  and unlabored. Afebrile. [name], [name] ([id number]) Vitals Time Taken: 1:47 PM, Temperature: 97.8 F, Pulse: 103 bpm, Respiratory Rate: 18 breaths/min, Blood Pressure: 116/66 mmHg. Eyes Nonicteric. Reactive to light. Ears, Nose, Mouth, and Throat Lips, teeth, and gums WNL.[name] Moist mucosa without lesions. Neck supple and nontender. No palpable supraclavicular or cervical adenopathy. Normal sized without goiter. Respiratory WNL. No retractions.. Cardiovascular Pedal Pulses WNL. No clubbing, cyanosis or edema. Lymphatic No adneopathy. No adenopathy. No adenopathy. Musculoskeletal Adexa without  tenderness or enlargement.. Digits and nails w/o clubbing, cyanosis, infection, petechiae, ischemia, or inflammatory conditions.[name] Psychiatric Judgement and insight Intact.. No evidence of depression, anxiety, or agitation.. General Notes: the lacerated wound on the left lateral leg continues to have a lot of necrotic debris and using a sharp dissection with a #3 curet I was able to debride a lot of this. Bleeding controlled with pressure. Integumentary (Hair, Skin) No suspicious lesions. No crepitus or fluctuance. No peri-wound warmth or erythema. No masses.. Wound #1 status is Open. Original cause of wound was Trauma. The wound is located on the Left,Lateral Lower Leg. The wound measures 6cm length x 5cm width x 0.3cm depth; 23.562cm^2 area and 7.069cm^3 volume. The wound is limited to skin breakdown. There is no tunneling or undermining noted. There is a medium amount of sanguinous drainage noted. The wound margin is distinct with the outline attached to the wound base. There is small (1-33%) red granulation within the wound bed. There is a large (67-100%) amount of necrotic tissue within the wound bed including Eschar and Adherent Slough. The periwound skin appearance exhibited: Induration, Localized Edema, Scarring, Ecchymosis, Hemosiderin Staining, Erythema. The periwound skin appearance did not exhibit: Callus, Crepitus, Excoriation, Fluctuance, Friable, Rash, Dry/Scaly, Maceration, Moist, Atrophie Blanche, Cyanosis, Mottled, Pallor, Rubor. The surrounding wound skin color is noted with erythema. Periwound temperature was noted as No Abnormality. The periwound has tenderness on palpation. [name], [name] ([id number]) Assessment Active Problems ICD-10 E11.622 - Type 2 diabetes mellitus with other skin ulcer L97.222 - Non-pressure chronic ulcer of left calf with fat layer exposed I89.0 - Lymphedema, not elsewhere classified Procedures Wound #1 Wound #1 is a Diabetic Wound/Ulcer of  the Lower Extremity located on the Left,Lateral Lower Leg . There was a Skin/Subcutaneous Tissue Debridement ([id number]) debridement with total area of 30 sq cm performed by Christin Fudge, MD. with the following instrument(s): Curette to remove Viable and Non-Viable tissue/material including Exudate, Fibrin/Slough, Eschar, and Subcutaneous after achieving pain control using Other (lidocaine 4%). A time out was conducted at 14:02, prior to the start of the procedure. A Minimum amount of bleeding was controlled with Pressure. The procedure was tolerated well with a pain level of 0 throughout and a pain level of 0 following the procedure. Post Debridement Measurements: 6cm length x 5cm width x 0.6cm depth; 14.137cm^3 volume. Character of Wound/Ulcer Post Debridement requires further debridement. Severity of Tissue Post Debridement is: Fat layer exposed. Post procedure Diagnosis Wound #1: Same as Pre-Procedure Plan Wound Cleansing: Wound #1 Left,Lateral Lower Leg: Clean wound with Normal Saline. May Shower, gently pat wound dry prior to applying new dressing. Anesthetic: Wound #1 Left,Lateral Lower Leg: Topical Lidocaine 4% cream applied to wound bed prior to debridement Skin Barriers/Peri-Wound Care: Wound #1 Left,Lateral Lower Leg: Skin Prep Primary Wound Dressing: Wound #1 Left,Lateral Lower Leg: Santyl Ointment [name], [name] ([id number]) Secondary Dressing: Wound #1 Left,Lateral Lower Leg: Boardered Foam Dressing Dressing Change Frequency: Wound #1 Left,Lateral Lower Leg: Change dressing every day. Follow-up Appointments: Wound #1 Left,Lateral  Lower Leg: Return Appointment in 1 week. Edema Control: Wound #1 Left,Lateral Lower Leg: Patient to wear own compression stockings Home Health: Wound #1 Left,Lateral Lower Leg: [place] Nurse may visit PRN to address patient s wound care needs. FACE TO FACE ENCOUNTER: MEDICARE and MEDICAID  PATIENTS: I certify that this patient is under my care and that I had a face-to-face encounter that meets the physician face-to-face encounter requirements with this patient on this date. The encounter with the patient was in whole or in part for the following MEDICAL CONDITION: (primary reason for [place]) MEDICAL NECESSITY: I certify, that based on my findings, NURSING services are a medically necessary home health service. HOME BOUND STATUS: I certify that my clinical findings support that this patient is homebound (i.e., Due to illness or injury, pt requires aid of supportive devices such as crutches, cane, wheelchairs, walkers, the use of special transportation or the assistance of another person to leave their place of residence. There is a normal inability to leave the home and doing so requires considerable and taxing effort. Other absences are for medical reasons / religious services and are infrequent or of short duration when for other reasons). If current dressing causes regression in wound condition, may D/C ordered dressing product/s and apply Normal Saline Moist Dressing daily until next [place] / Other MD appointment. [place] of regression in wound condition at [phone]. Please direct any NON-WOUND related issues/requests for orders to patient's Primary Care Physician Medications-please add to medication list.: Wound #1 Left,Lateral Lower Leg: Santyl Enzymatic Ointment I have recommended: 1. Santyl appointment to be applied daily with a bordered foam. 2. elevation and exercise. 3. good control for diabetes mellitus 4. Adequate intake of protein, vitamin A, vitamin C and zinc 5. Regular visits to the wound center Electronic Signature(s) Signed: 02/08/2016 2:31:46 PM By: Christin Fudge MD, FACS [name] ([id number]) Entered By: Christin Fudge on 02/08/2016 14:31:45 [name]  ([id number]) -------------------------------------------------------------------------------- SuperBill Details Patient Name: [name]. Date of Service: 02/08/2016 Medical Record Number: [id number] Patient Account Number: [id number] Date of Birth/Sex: [birth year] (75 y.o. Female) Treating RN: Cornell Barman Primary Care Physician: Glendale Chard Other Clinician: Referring Physician: Glendale Chard Treating Physician/Extender: Frann Rider in Treatment: 1 Diagnosis Coding ICD-10 Codes Code Description E11.622 Type 2 diabetes mellitus with other skin ulcer L97.222 Non-pressure chronic ulcer of left calf with fat layer exposed I89.0 Lymphedema, not elsewhere classified Facility Procedures CPT4 Code: 44034742 Description: 11042 - DEB SUBQ TISSUE 20 SQ CM/< ICD-10 Description Diagnosis E11.622 Type 2 diabetes mellitus with other skin ulcer L97.222 Non-pressure chronic ulcer of left calf with fat l I89.0 Lymphedema, not elsewhere classified Modifier: ayer exposed Quantity: 1 CPT4 Code: [id number] Description: 11045 - DEB SUBQ TISS EA ADDL 20CM ICD-10 Description Diagnosis E11.622 Type 2 diabetes mellitus with other skin ulcer L97.222 Non-pressure chronic ulcer of left calf with fat l I89.0 Lymphedema, not elsewhere classified Modifier: ayer exposed Quantity: 1 Physician Procedures CPT4 Code: [id number] Description: 11042 - WC PHYS SUBQ TISS 20 SQ CM ICD-10 Description Diagnosis E11.622 Type 2 diabetes mellitus with other skin ulcer L97.222 Non-pressure chronic ulcer of left calf with fat la I89.0 Lymphedema, not elsewhere classified Modifier: yer exposed Quantity: 1 CPT4 Code: 1884166 Ang, DELO Description: 11045 - WC PHYS SUBQ TISS EA ADDL 20 CM ICD-10 Description Diagnosis RIS E. ([id number]) Modifier: Quantity: 1 Electronic Signature(s) Signed: 02/08/2016 2:32:24 PM By: Christin Fudge MD, FACS Entered By: Con Memos  Edmonia Gonser on 02/08/2016 14:32:24

## 2016-02-09 NOTE — Progress Notes (Signed)
[name], [name] ([id number]) Visit Report for 02/08/2016 Arrival Information Details Patient Name: [name], [name]. Date of Service: 02/08/2016 1:30 PM Medical Record Number: [id number] Patient Account Number: [id number] Date of Birth/Sex: January 03, 1941 (75 y.o. Female) Treating RN: Cornell Barman Primary Care Physician: Glendale Chard Other Clinician: Referring Physician: Glendale Chard Treating Physician/Extender: Frann Rider in Treatment: 1 Visit Information History Since Last Visit Added or deleted any medications: No Patient Arrived: [place] Any new allergies or adverse reactions: No Arrival Time: 13:42 Had a fall or experienced change in No Accompanied By: daughter activities of daily living that may affect Transfer Assistance: None risk of falls: Patient Identification Verified: Yes Signs or symptoms of abuse/neglect since last No Secondary Verification Process Yes visito Completed: Hospitalized since last visit: No Patient Has Alerts: Yes Has Dressing in Place as Prescribed: Yes Patient Alerts: DM II Pain Present Now: No Electronic Signature(s) Signed: 02/08/2016 4:13:53 PM By: Gretta Cool, RN, BSN, Kim RN, BSN Entered By: Gretta Cool, RN, BSN, Kim on 02/08/2016 13:44:59 [name] ([id number]) -------------------------------------------------------------------------------- Encounter Discharge Information Details Patient Name: [name]. Date of Service: 02/08/2016 1:30 PM Medical Record Number: [id number] Patient Account Number: [id number] Date of Birth/Sex: May 27, 1940 (75 y.o. Female) Treating RN: Cornell Barman Primary Care Physician: Glendale Chard Other Clinician: Referring Physician: Glendale Chard Treating Physician/Extender: Frann Rider in Treatment: 1 Encounter Discharge Information Items Discharge Pain Level: 0 Discharge Condition: Stable Ambulatory Status: Ambulatory Discharge Destination: Home Transportation: Private Auto Accompanied  By: daughter Schedule Follow-up Appointment: Yes Medication Reconciliation completed and provided to Patient/Care Yes Synethia Endicott: Provided on Clinical Summary of Care: 02/08/2016 Form Type Recipient Paper Patient DG Electronic Signature(s) Signed: 02/08/2016 2:20:11 PM By: Ruthine Dose Entered By: Ruthine Dose on 02/08/2016 14:20:11 [name] ([id number]) -------------------------------------------------------------------------------- Lower Extremity Assessment Details Patient Name: [name]. Date of Service: 02/08/2016 1:30 PM Medical Record Number: [id number] Patient Account Number: [id number] Date of Birth/Sex: 1940-06-14 (75 y.o. Female) Treating RN: Cornell Barman Primary Care Physician: Glendale Chard Other Clinician: Referring Physician: Glendale Chard Treating Physician/Extender: Frann Rider in Treatment: 1 Edema Assessment Assessed: [Left: No] [Right: No] E[Left: dema] [Right: :] Calf Left: Right: Point of Measurement: 34 cm From Medial Instep 48.5 cm cm Ankle Left: Right: Point of Measurement: 11 cm From Medial Instep 32.9 cm cm Vascular Assessment Pulses: Posterior Tibial Palpable: [Left:Yes] Dorsalis Pedis Palpable: [Left:Yes] Extremity colors, hair growth, and conditions: Extremity Color: [Left:Hyperpigmented] Hair Growth on Extremity: [Left:No] Temperature of Extremity: [Left:Warm] Capillary Refill: [Left:< 3 seconds] Dependent Rubor: [Left:No] Blanched when Elevated: [Left:No] Lipodermatosclerosis: [Left:No] Toe Nail Assessment Left: Right: Thick: Yes Discolored: No Deformed: No Improper Length and Hygiene: No Electronic Signature(sMAYGEN, SIRICO ([id number]) Signed: 02/08/2016 4:13:53 PM By: Gretta Cool, RN, BSN, Kim RN, BSN Entered By: Gretta Cool, RN, BSN, Kim on 02/08/2016 13:50:13 [name] ([id number]) -------------------------------------------------------------------------------- Multi Wound Chart Details Patient  Name: [name]. Date of Service: 02/08/2016 1:30 PM Medical Record Number: [id number] Patient Account Number: [id number] Date of Birth/Sex: 29-May-1940 (75 y.o. 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 N/A N/A Comorbid History: Cataracts, Lymphedema, N/A N/A Hypertension, Type II Diabetes, Gout, Rheumatoid  Arthritis, Osteoarthritis, Neuropathy Date Acquired: 12/27/2015 N/A N/A Weeks of Treatment: 1 N/A N/A Wound Status: Open N/A N/A Measurements L x W x D 6x5x0.3 N/A N/A (cm) Area (cm) : 23.562 N/A N/A Volume (cm) : 7.069 N/A N/A % Reduction in Area: -19.00% N/A N/A % Reduction in Volume: -78.60% N/A N/A Classification: Grade 2 N/A N/A Exudate Amount: Medium N/A N/A Exudate Type: Sanguinous N/A N/A Exudate Color: red N/A N/A Wound Margin: Distinct, outline attached N/A N/A Umali, Blakeley E. ([id number]) Granulation Amount: Small (1-33%) N/A N/A Granulation Quality: Red, Hyper-granulation N/A N/A Necrotic Amount: Large (67-100%) N/A N/A Necrotic Tissue: Eschar, Adherent Slough N/A N/A Exposed Structures: Fascia: No N/A N/A Fat: No Tendon: No Muscle: No Joint: No Bone: No Limited to Skin Breakdown Epithelialization: Small (1-33%) N/A N/A Periwound Skin Texture: Edema: Yes N/A N/A Induration: Yes Scarring: Yes Excoriation: No Callus: No Crepitus: No Fluctuance: No Friable: No Rash: No Periwound Skin Maceration: No N/A  N/A Moisture: Moist: No Dry/Scaly: No Periwound Skin Color: Ecchymosis: Yes N/A N/A Erythema: Yes Hemosiderin Staining: Yes Atrophie Blanche: No Cyanosis: No Mottled: No Pallor: No Rubor: No Temperature: No Abnormality N/A N/A Tenderness on Yes N/A N/A Palpation: Wound Preparation: Ulcer Cleansing: N/A N/A Rinsed/Irrigated with Saline Topical Anesthetic Applied: Other: lidocaine 4% Treatment Notes Electronic Signature(s) Signed: 02/08/2016 4:13:53 PM By: Gretta Cool, RN, BSN, Kim RN, BSN Entered By: Gretta Cool, RN, BSN, Kim on 02/08/2016 13:54:36 [name], [name] ([id number]) [name], [name] ([id number]) -------------------------------------------------------------------------------- Multi-Disciplinary Care Plan Details Patient Name: [name], [name]. Date of Service: 02/08/2016 1:30 PM Medical Record Number: [id number] Patient Account Number: [id number] Date of Birth/Sex: [birth year] (75 y.o. Female) Treating RN: Cornell Barman Primary Care Physician: Glendale Chard Other Clinician: Referring Physician: Glendale Chard Treating Physician/Extender: Frann Rider in Treatment: 1 Active Inactive Abuse / Safety / Falls / Self Care Management Nursing Diagnoses: Impaired physical mobility Potential for falls Goals: Patient will remain injury free Date Initiated: 02/01/2016 Goal Status: Active Interventions: Assess fall risk on admission and as needed Notes: Orientation to the Wound Care Program Nursing Diagnoses: Knowledge deficit related to the wound healing center program Goals: Patient/caregiver will verbalize understanding of the [place] Program Date Initiated: 02/01/2016 Goal Status: Active Interventions: Provide education on orientation to the wound center Notes: Wound/Skin Impairment Nursing Diagnoses: Impaired tissue integrity Goals: Patient/caregiver will verbalize understanding of skin care regimen [name], [name] ([id number]) Date  Initiated: 02/01/2016 Goal Status: Active Ulcer/skin breakdown will have a volume reduction of 30% by week 4 Date Initiated: 02/01/2016 Goal Status: Active Ulcer/skin breakdown will have a volume reduction of 50% by week 8 Date Initiated: 02/01/2016 Goal Status: Active Ulcer/skin breakdown will have a volume reduction of 80% by week 12 Date Initiated: 02/01/2016 Goal Status: Active Ulcer/skin breakdown will heal within 14 weeks Date Initiated: 02/01/2016 Goal Status: Active Interventions: Assess patient/caregiver ability to obtain necessary supplies Assess patient/caregiver ability to perform ulcer/skin care regimen upon admission and as needed Assess ulceration(s) every visit Notes: Electronic Signature(s) Signed: 02/08/2016 4:13:53 PM By: Gretta Cool, RN, BSN, Kim RN, BSN Entered By: Gretta Cool, RN, BSN, Kim on 02/08/2016 13:54:28 [name] ([id number]) -------------------------------------------------------------------------------- Pain Assessment Details Patient Name: [name]. Date of Service: 02/08/2016 1:30 PM Medical Record Number: [id number] Patient Account Number: [id number] Date of Birth/Sex: 07/28/1940 (75 y.o. Female) Treating RN: Cornell Barman Primary Care Physician: Glendale Chard Other Clinician: Referring Physician: Glendale Chard Treating Physician/Extender: Frann Rider in Treatment: 1 Active Problems Location of Pain Severity and Description of Pain  Patient Has Paino No Site Locations With Dressing Change: No Pain Management and Medication Current Pain Management: Electronic Signature(s) Signed: 02/08/2016 4:13:53 PM By: Gretta Cool, RN, BSN, Kim RN, BSN Entered By: Gretta Cool, RN, BSN, Kim on 02/08/2016 13:47:49 [name] ([id number]) -------------------------------------------------------------------------------- Patient/Caregiver Education Details Patient Name: [name] Date of Service: 02/08/2016 1:30 PM Medical Record Number:  [id number] Patient Account Number: [id number] Date of Birth/Gender: 10-21-1940 (75 y.o. Female) Treating RN: Cornell Barman Primary Care Physician: Glendale Chard Other Clinician: Referring Physician: Glendale Chard Treating Physician/Extender: Frann Rider in Treatment: 1 Education Assessment Education Provided To: Patient Education Topics Provided Wound/Skin Impairment: Handouts: Caring for Your Ulcer, Other: wound care as prescribed Methods: Demonstration Responses: State content correctly Electronic Signature(s) Signed: 02/08/2016 4:13:53 PM By: Gretta Cool, RN, BSN, Kim RN, BSN Entered By: Gretta Cool, RN, BSN, Kim on 02/08/2016 14:13:12 [name] ([id number]) -------------------------------------------------------------------------------- Wound Assessment Details Patient Name: [name]. Date of Service: 02/08/2016 1:30 PM Medical Record Number: [id number] Patient Account Number: [id number] Date of Birth/Sex: August 27, 1940 (75 y.o. Female) Treating RN: Cornell Barman Primary Care Physician: Glendale Chard Other Clinician: Referring Physician: Glendale Chard Treating Physician/Extender: Frann Rider in Treatment: 1 Wound Status Wound Number: 1 Primary Diabetic Wound/Ulcer of the Lower Etiology: Extremity Wound Location: Left Lower Leg - Lateral Secondary Auto-immune Wounding Event: Trauma Etiology: Date Acquired: 12/27/2015 Wound Open Weeks Of Treatment: 1 Status: Clustered Wound: No Comorbid Cataracts, Lymphedema, History: Hypertension, Type II Diabetes, Gout, Rheumatoid Arthritis, Osteoarthritis, Neuropathy Photos Wound Measurements Length: (cm) 6 Width: (cm) 5 Depth: (cm) 0.3 Area: (cm) 23.562 Volume: (cm) 7.069 % Reduction in Area: -19% % Reduction in Volume: -78.6% Epithelialization: Small (1-33%) Tunneling: No Undermining: No Wound Description Classification: Grade 2 Wound Margin: Distinct, outline attached Exudate Amount:  Medium Exudate Type: Sanguinous Exudate Color: red Wound Bed Granulation Amount: Small (1-33%) Exposed Structure Granulation Quality: Red, Hyper-granulation Fascia Exposed: No Necrotic Amount: Large (67-100%) Fat Layer Exposed: No Necrotic Quality: Eschar, Adherent Slough Tendon Exposed: No [name], [name] ([id number]) Muscle Exposed: No Joint Exposed: No Bone Exposed: No Limited to Skin Breakdown Periwound Skin Texture Texture Color No Abnormalities Noted: No No Abnormalities Noted: No Callus: No Atrophie Blanche: No Crepitus: No Cyanosis: No Excoriation: No Ecchymosis: Yes Fluctuance: No Erythema: Yes Friable: No Hemosiderin Staining: Yes Induration: Yes Mottled: No Localized Edema: Yes Pallor: No Rash: No Rubor: No Scarring: Yes Temperature / Pain Moisture Temperature: No Abnormality No Abnormalities Noted: No Tenderness on Palpation: Yes Dry / Scaly: No Maceration: No Moist: No Wound Preparation Ulcer Cleansing: Rinsed/Irrigated with Saline Topical Anesthetic Applied: Other: lidocaine 4%, Treatment Notes Wound #1 (Left, Lateral Lower Leg) 1. Cleansed with: Clean wound with Normal Saline 2. Anesthetic Topical Lidocaine 4% cream to wound bed prior to debridement 4. Dressing Applied: Santyl Ointment 5. Secondary [place] 7. Secured with Patient to wear own compression stockings Electronic Signature(s) Signed: 02/08/2016 4:13:53 PM By: Gretta Cool, RN, BSN, Kim RN, BSN Entered By: Gretta Cool, RN, BSN, Kim on 02/08/2016 13:53:41 [name] ([id number]) -------------------------------------------------------------------------------- [place] Details Patient Name: [name] Date of Service: 02/08/2016 1:30 PM Medical Record Number: [id number] Patient Account Number: [id number] Date of Birth/Sex: [birth year] (75 y.o. Female) Treating RN: Cornell Barman Primary Care Physician: Glendale Chard Other Clinician: Referring Physician:  Glendale Chard Treating Physician/Extender: Frann Rider in Treatment: 1 Vital Signs Time Taken: 13:47 Temperature (F): 97.8 Pulse (bpm): 103 Respiratory Rate (breaths/min): 18 Blood Pressure (mmHg): 116/66 Reference Range: 80 - 120 mg / dl Electronic Signature(s)  Signed: 02/08/2016 4:13:53 PM By: Gretta Cool, RN, BSN, Kim RN, BSN Entered By: Gretta Cool, RN, BSN, Kim on 02/08/2016 13:48:08

## 2016-02-10 DIAGNOSIS — I5032 Chronic diastolic (congestive) heart failure: Secondary | ICD-10-CM | POA: Diagnosis not present

## 2016-02-10 DIAGNOSIS — T798XXD Other early complications of trauma, subsequent encounter: Secondary | ICD-10-CM | POA: Diagnosis not present

## 2016-02-10 DIAGNOSIS — S81812D Laceration without foreign body, left lower leg, subsequent encounter: Secondary | ICD-10-CM | POA: Diagnosis not present

## 2016-02-10 DIAGNOSIS — E1140 Type 2 diabetes mellitus with diabetic neuropathy, unspecified: Secondary | ICD-10-CM | POA: Diagnosis not present

## 2016-02-10 DIAGNOSIS — E1129 Type 2 diabetes mellitus with other diabetic kidney complication: Secondary | ICD-10-CM | POA: Diagnosis not present

## 2016-02-10 DIAGNOSIS — I130 Hypertensive heart and chronic kidney disease with heart failure and stage 1 through stage 4 chronic kidney disease, or unspecified chronic kidney disease: Secondary | ICD-10-CM | POA: Diagnosis not present

## 2016-02-11 DIAGNOSIS — T798XXD Other early complications of trauma, subsequent encounter: Secondary | ICD-10-CM | POA: Diagnosis not present

## 2016-02-11 DIAGNOSIS — E1140 Type 2 diabetes mellitus with diabetic neuropathy, unspecified: Secondary | ICD-10-CM | POA: Diagnosis not present

## 2016-02-11 DIAGNOSIS — S81812D Laceration without foreign body, left lower leg, subsequent encounter: Secondary | ICD-10-CM | POA: Diagnosis not present

## 2016-02-11 DIAGNOSIS — I130 Hypertensive heart and chronic kidney disease with heart failure and stage 1 through stage 4 chronic kidney disease, or unspecified chronic kidney disease: Secondary | ICD-10-CM | POA: Diagnosis not present

## 2016-02-11 DIAGNOSIS — E1129 Type 2 diabetes mellitus with other diabetic kidney complication: Secondary | ICD-10-CM | POA: Diagnosis not present

## 2016-02-11 DIAGNOSIS — I5032 Chronic diastolic (congestive) heart failure: Secondary | ICD-10-CM | POA: Diagnosis not present

## 2016-02-12 DIAGNOSIS — E1129 Type 2 diabetes mellitus with other diabetic kidney complication: Secondary | ICD-10-CM | POA: Diagnosis not present

## 2016-02-12 DIAGNOSIS — S81812D Laceration without foreign body, left lower leg, subsequent encounter: Secondary | ICD-10-CM | POA: Diagnosis not present

## 2016-02-12 DIAGNOSIS — E1140 Type 2 diabetes mellitus with diabetic neuropathy, unspecified: Secondary | ICD-10-CM | POA: Diagnosis not present

## 2016-02-12 DIAGNOSIS — I5032 Chronic diastolic (congestive) heart failure: Secondary | ICD-10-CM | POA: Diagnosis not present

## 2016-02-12 DIAGNOSIS — I130 Hypertensive heart and chronic kidney disease with heart failure and stage 1 through stage 4 chronic kidney disease, or unspecified chronic kidney disease: Secondary | ICD-10-CM | POA: Diagnosis not present

## 2016-02-12 DIAGNOSIS — T798XXD Other early complications of trauma, subsequent encounter: Secondary | ICD-10-CM | POA: Diagnosis not present

## 2016-02-15 ENCOUNTER — Encounter: Payer: Medicare Other | Admitting: Surgery

## 2016-02-15 DIAGNOSIS — J449 Chronic obstructive pulmonary disease, unspecified: Secondary | ICD-10-CM | POA: Diagnosis not present

## 2016-02-15 DIAGNOSIS — L97822 Non-pressure chronic ulcer of other part of left lower leg with fat layer exposed: Secondary | ICD-10-CM | POA: Diagnosis not present

## 2016-02-15 DIAGNOSIS — I890 Lymphedema, not elsewhere classified: Secondary | ICD-10-CM | POA: Diagnosis not present

## 2016-02-15 DIAGNOSIS — E11622 Type 2 diabetes mellitus with other skin ulcer: Secondary | ICD-10-CM | POA: Diagnosis not present

## 2016-02-15 DIAGNOSIS — E1122 Type 2 diabetes mellitus with diabetic chronic kidney disease: Secondary | ICD-10-CM | POA: Diagnosis not present

## 2016-02-15 DIAGNOSIS — L97222 Non-pressure chronic ulcer of left calf with fat layer exposed: Secondary | ICD-10-CM | POA: Diagnosis not present

## 2016-02-15 DIAGNOSIS — N189 Chronic kidney disease, unspecified: Secondary | ICD-10-CM | POA: Diagnosis not present

## 2016-02-16 NOTE — Progress Notes (Signed)
MARAKI, MACQUARRIE ([id number]) Visit Report for 02/15/2016 Chief Complaint Document Details Patient Name: [name], [name]. Date of Service: 02/15/2016 1:30 PM Medical Record Number: [id number] Patient Account Number: [id number] Date of Birth/Sex: 01-Aug-1940 (75 y.o. Female) Treating RN: Cornell Barman Primary Care Physician: Glendale Chard Other Clinician: Referring Physician: Glendale Chard Treating Physician/Extender: Frann Rider in Treatment: 2 Information Obtained from: Patient Chief Complaint Patients presents for treatment of an open diabetic ulcer the left lower extremity which she's had for about 4 weeks Electronic Signature(s) Signed: 02/15/2016 2:21:31 PM By: Christin Fudge MD, FACS Entered By: Christin Fudge on 02/15/2016 14:21:31 [name] ([id number]) -------------------------------------------------------------------------------- Debridement Details Patient Name: [name]. Date of Service: 02/15/2016 1:30 PM Medical Record Number: [id number] Patient Account Number: [id number] Date of Birth/Sex: 08-Aug-1940 (75 y.o. Female) Treating RN: Cornell Barman Primary Care Physician: Glendale Chard Other Clinician: Referring Physician: Glendale Chard Treating Physician/Extender: Frann Rider in Treatment: 2 Debridement Performed for Wound #1 Left,Lateral Lower Leg Assessment: Performed By: Physician Christin Fudge, MD Debridement: Debridement Pre-procedure Yes - 14:11 Verification/Time Out Taken: Start Time: 14:11 Pain Control: Lidocaine 4% Topical Solution Level: Skin/Subcutaneous Tissue Total Area Debrided (L x 5 (cm) x 4.6 (cm) = 23 (cm) W): Tissue and other Viable, Non-Viable, Exudate, Fibrin/Slough, Subcutaneous material debrided: Instrument: Curette Bleeding: Minimum Hemostasis Achieved: Pressure End Time: 14:14 Procedural Pain: 0 Post Procedural Pain: 0 Response to Treatment: Procedure was tolerated well Post Debridement  Measurements of Total Wound Length: (cm) 5 Width: (cm) 4.6 Depth: (cm) 0.3 Volume: (cm) 5.419 Character of Wound/Ulcer Post Improved Debridement: Severity of Tissue Post Debridement: Fat layer exposed Post Procedure Diagnosis Same as Pre-procedure Electronic Signature(s) Signed: 02/15/2016 2:21:23 PM By: Christin Fudge MD, FACS Signed: 02/15/2016 4:17:19 PM By: Gretta Cool RN, BSN, Kim RN, BSN Entered By: Christin Fudge on 02/15/2016 14:21:22 MELIAH, APPLEMAN ([name], Edwena Felty ([id number]) -------------------------------------------------------------------------------- HPI Details Patient Name: [name]. Date of Service: 02/15/2016 1:30 PM Medical Record Number: [id number] Patient Account Number: [id number] Date of Birth/Sex: 11/14/1940 (75 y.o. Female) Treating RN: Cornell Barman Primary Care Physician: Glendale Chard Other Clinician: Referring Physician: Glendale Chard Treating Physician/Extender: Frann Rider in Treatment: 2 History of Present Illness Location: left lower extremity lateral calf Quality: Patient reports experiencing a dull pain to affected area(s). Severity: Patient states wound are getting worse. Duration: Patient has had the wound for < 4 weeks prior to presenting for treatment Timing: Pain in wound is Intermittent (comes and goes Context: The wound occurred when the patient was injured with a sharp corner for car door Modifying Factors: Other treatment(s) tried include:local care and 3 courses of antibiotics including Keflex, Bactrim and doxycycline Associated Signs and Symptoms: Patient reports having increase swelling. HPI Description: 75 year old patient with a past medical history of diabetes mellitus, chronic kidney disease, COPD, hypertension, rheumatoid arthritis had a lacerated wound to her left calf when she injured it on the corner of a car door and it was a long 12 cm laceration. x-ray done at the time revealed that there  was no fracture or radiopaque foreign body. the wound was sutured with 4-0 Prolene simple interrupted sutures and a total of 14 sutures were applied. on August 31 she was seen in the ER and it was noted that the wound had dehisced and she was treated with Keflex. She has completed a course of doxycycline recently Medical history also significant for status post abdominal hysterectomy, brain surgery, cardiac catheterization, EGD and bronchoscopy. of late she was back in  the ER and was seen last on August 31 and they tried Immunologist) Signed: 02/15/2016 2:21:36 PM By: Christin Fudge MD, FACS Entered By: Christin Fudge on 02/15/2016 14:21:35 [name] ([id number]) -------------------------------------------------------------------------------- Physical Exam Details Patient Name: [name] Date of Service: 02/15/2016 1:30 PM Medical Record Number: [id number] Patient Account Number: [id number] Date of Birth/Sex: 03-02-1941 (75 y.o. Female) Treating RN: Cornell Barman Primary Care Physician: Glendale Chard Other Clinician: Referring Physician: Glendale Chard Treating Physician/Extender: Frann Rider in Treatment: 2 Constitutional . Pulse regular. Respirations normal and unlabored. Afebrile. . Eyes Nonicteric. Reactive to light. Ears, Nose, Mouth, and Throat Lips, teeth, and gums WNL.[name] Moist mucosa without lesions. Neck supple and nontender. No palpable supraclavicular or cervical adenopathy. Normal sized without goiter. Respiratory WNL. No retractions.. Cardiovascular Pedal Pulses WNL. No clubbing, cyanosis or edema. Lymphatic No adneopathy. No adenopathy. No adenopathy. Musculoskeletal Adexa without tenderness or enlargement.. Digits and nails w/o clubbing, cyanosis, infection, petechiae, ischemia, or inflammatory conditions.. Integumentary (Hair, Skin) No suspicious lesions. No crepitus or fluctuance. No peri-wound warmth or erythema. No  masses.[name] Psychiatric Judgement and insight Intact.. No evidence of depression, anxiety, or agitation.. Notes the wound continues to have subcutaneous debris which is quite significant and I have sharply removed this with a #3 curet and bleeding controlled with pressure Electronic Signature(s) Signed: 02/15/2016 2:22:16 PM By: Christin Fudge MD, FACS Entered By: Christin Fudge on 02/15/2016 14:22:15 [name] ([id number]) -------------------------------------------------------------------------------- Physician Orders Details Patient Name: [name]. Date of Service: 02/15/2016 1:30 PM Medical Record Number: [id number] Patient Account Number: [id number] Date of Birth/Sex: Jan 30, 1941 (75 y.o. Female) Treating RN: Montey Hora Primary Care Physician: Glendale Chard Other Clinician: Referring Physician: Glendale Chard Treating Physician/Extender: Frann Rider in Treatment: 2 Verbal / Phone Orders: Yes Clinician: Montey Hora Read Back and Verified: Yes Diagnosis Coding Wound Cleansing Wound #1 Left,Lateral Lower Leg o Clean wound with Normal Saline. o May Shower, gently pat wound dry prior to applying new dressing. Anesthetic Wound #1 Left,Lateral Lower Leg o Topical Lidocaine 4% cream applied to wound bed prior to debridement Skin Barriers/Peri-Wound Care Wound #1 Left,Lateral Lower Leg o Skin Prep Primary Wound Dressing Wound #1 Left,Lateral Lower Leg o Santyl Ointment Secondary Dressing Wound #1 Left,Lateral Lower Leg o Dry Gauze o Boardered Foam Dressing Dressing Change Frequency Wound #1 Left,Lateral Lower Leg o Change dressing every day. Follow-up Appointments Wound #1 Left,Lateral Lower Leg o Return Appointment in 1 week. Edema Control Wound #1 Left,Lateral Lower Leg o Patient to wear own compression stockings ENCARNACION, BOLE ([id number]) Home Health Wound #1 [place] Visits  [facility] to visit patient 2 times weekly, family to change dressing all other days o Home Health Nurse may visit PRN to address patientos wound care needs. o FACE TO FACE ENCOUNTER: MEDICARE and MEDICAID PATIENTS: I certify that this patient is under my care and that I had a face-to-face encounter that meets the physician face-to-face encounter requirements with this patient on this date. The encounter with the patient was in whole or in part for the following MEDICAL CONDITION: (primary reason for [place]) MEDICAL NECESSITY: I certify, that based on my findings, NURSING services are a medically necessary home health service. HOME BOUND STATUS: I certify that my clinical findings support that this patient is homebound (i.e., Due to illness or injury, pt requires aid of supportive devices such as crutches, cane, wheelchairs, walkers, the use of special transportation or the assistance  of another person to leave their place of residence. There is a normal inability to leave the home and doing so requires considerable and taxing effort. Other absences are for medical reasons / religious services and are infrequent or of short duration when for other reasons). o If current dressing causes regression in wound condition, may D/C ordered dressing product/s and apply Normal Saline Moist Dressing daily until next [place] / Other MD appointment. [place] of regression in wound condition at [phone]. o Please direct any NON-WOUND related issues/requests for orders to patient's Primary Care Physician Medications-please add to medication list. Wound #1 Left,Lateral Lower Leg o Santyl Enzymatic Ointment Electronic Signature(s) Signed: 02/15/2016 3:47:29 PM By: Christin Fudge MD, FACS Signed: 02/15/2016 4:28:24 PM By: Montey Hora Entered By: Montey Hora on 02/15/2016 14:59:08 [name]  ([id number]) -------------------------------------------------------------------------------- Problem List Details Patient Name: LAILIE, SMEAD. Date of Service: 02/15/2016 1:30 PM Medical Record Number: [id number] Patient Account Number: [id number] Date of Birth/Sex: 1940-06-13 (75 y.o. Female) Treating RN: Cornell Barman Primary Care Physician: Glendale Chard Other Clinician: Referring Physician: Glendale Chard Treating Physician/Extender: Frann Rider in Treatment: 2 Active Problems ICD-10 Encounter Code Description Active Date Diagnosis E11.622 Type 2 diabetes mellitus with other skin ulcer 02/01/2016 Yes L97.222 Non-pressure chronic ulcer of left calf with fat layer 02/01/2016 Yes exposed I89.0 Lymphedema, not elsewhere classified 02/01/2016 Yes Inactive Problems Resolved Problems Electronic Signature(s) Signed: 02/15/2016 2:20:47 PM By: Christin Fudge MD, FACS Entered By: Christin Fudge on 02/15/2016 14:20:47 [name] ([id number]) -------------------------------------------------------------------------------- Progress Note Details Patient Name: [name]. Date of Service: 02/15/2016 1:30 PM Medical Record Number: [id number] Patient Account Number: [id number] Date of Birth/Sex: 05-29-1940 (75 y.o. Female) Treating RN: Cornell Barman Primary Care Physician: Glendale Chard Other Clinician: Referring Physician: Glendale Chard Treating Physician/Extender: Frann Rider in Treatment: 2 Subjective Chief Complaint Information obtained from Patient Patients presents for treatment of an open diabetic ulcer the left lower extremity which she's had for about 4 weeks History of Present Illness (HPI) The following HPI elements were documented for the patient's wound: Location: left lower extremity lateral calf Quality: Patient reports experiencing a dull pain to affected area(s). Severity: Patient states wound are getting worse. Duration: Patient has had the  wound for < 4 weeks prior to presenting for treatment Timing: Pain in wound is Intermittent (comes and goes Context: The wound occurred when the patient was injured with a sharp corner for car door Modifying Factors: Other treatment(s) tried include:local care and 3 courses of antibiotics including Keflex, Bactrim and doxycycline Associated Signs and Symptoms: Patient reports having increase swelling. 75 year old patient with a past medical history of diabetes mellitus, chronic kidney disease, COPD, hypertension, rheumatoid arthritis had a lacerated wound to her left calf when she injured it on the corner of a car door and it was a long 12 cm laceration. x-ray done at the time revealed that there was no fracture or radiopaque foreign body. the wound was sutured with 4-0 Prolene simple interrupted sutures and a total of 14 sutures were applied. on August 31 she was seen in the ER and it was noted that the wound had dehisced and she was treated with Keflex. She has completed a course of doxycycline recently Medical history also significant for status post abdominal hysterectomy, brain surgery, cardiac catheterization, EGD and bronchoscopy. of late she was back in the ER and was seen last on August 31 and they tried Silvadene Objective Constitutional Pulse regular. Respirations normal and  unlabored. Afebrile. SKYRA, CRICHLOW ([id number]) Vitals Time Taken: 1:57 PM, Temperature: 97.9 F, Pulse: 103 bpm, Respiratory Rate: 18 breaths/min, Blood Pressure: 135/72 mmHg. Eyes Nonicteric. Reactive to light. Ears, Nose, Mouth, and Throat Lips, teeth, and gums WNL.[name] Moist mucosa without lesions. Neck supple and nontender. No palpable supraclavicular or cervical adenopathy. Normal sized without goiter. Respiratory WNL. No retractions.. Cardiovascular Pedal Pulses WNL. No clubbing, cyanosis or edema. Lymphatic No adneopathy. No adenopathy. No adenopathy. Musculoskeletal Adexa without  tenderness or enlargement.. Digits and nails w/o clubbing, cyanosis, infection, petechiae, ischemia, or inflammatory conditions.[name] Psychiatric Judgement and insight Intact.. No evidence of depression, anxiety, or agitation.. General Notes: the wound continues to have subcutaneous debris which is quite significant and I have sharply removed this with a #3 curet and bleeding controlled with pressure Integumentary (Hair, Skin) No suspicious lesions. No crepitus or fluctuance. No peri-wound warmth or erythema. No masses.. Wound #1 status is Open. Original cause of wound was Trauma. The wound is located on the Left,Lateral Lower Leg. The wound measures 5cm length x 4.6cm width x 0.3cm depth; 18.064cm^2 area and 5.419cm^3 volume. The wound is limited to skin breakdown. There is no tunneling or undermining noted. There is a medium amount of sanguinous drainage noted. The wound margin is distinct with the outline attached to the wound base. There is small (1-33%) red granulation within the wound bed. There is a large (67-100%) amount of necrotic tissue within the wound bed including Eschar and Adherent Slough. The periwound skin appearance exhibited: Induration, Localized Edema, Scarring, Ecchymosis, Hemosiderin Staining, Erythema. The periwound skin appearance did not exhibit: Callus, Crepitus, Excoriation, Fluctuance, Friable, Rash, Dry/Scaly, Maceration, Moist, Atrophie Blanche, Cyanosis, Mottled, Pallor, Rubor. The surrounding wound skin color is noted with erythema. Periwound temperature was noted as No Abnormality. The periwound has tenderness on palpation. SREYA, FROIO ([id number]) Assessment Active Problems ICD-10 E11.622 - Type 2 diabetes mellitus with other skin ulcer L97.222 - Non-pressure chronic ulcer of left calf with fat layer exposed I89.0 - Lymphedema, not elsewhere classified Procedures Wound #1 Wound #1 is a Diabetic Wound/Ulcer of the Lower Extremity located on the  Left,Lateral Lower Leg . There was a Skin/Subcutaneous Tissue Debridement ([id number]) debridement with total area of 23 sq cm performed by Christin Fudge, MD. with the following instrument(s): Curette to remove Viable and Non-Viable tissue/material including Exudate, Fibrin/Slough, and Subcutaneous after achieving pain control using Lidocaine 4% Topical Solution. A time out was conducted at 14:11, prior to the start of the procedure. A Minimum amount of bleeding was controlled with Pressure. The procedure was tolerated well with a pain level of 0 throughout and a pain level of 0 following the procedure. Post Debridement Measurements: 5cm length x 4.6cm width x 0.3cm depth; 5.419cm^3 volume. Character of Wound/Ulcer Post Debridement is improved. Severity of Tissue Post Debridement is: Fat layer exposed. Post procedure Diagnosis Wound #1: Same as Pre-Procedure Plan Wound Cleansing: Wound #1 Left,Lateral Lower Leg: Clean wound with Normal Saline. May Shower, gently pat wound dry prior to applying new dressing. Anesthetic: Wound #1 Left,Lateral Lower Leg: Topical Lidocaine 4% cream applied to wound bed prior to debridement Skin Barriers/Peri-Wound Care: Wound #1 Left,Lateral Lower Leg: Skin Prep Primary Wound Dressing: Wound #1 Left,Lateral Lower Leg: Santyl Ointment Peer, Gretchen E. ([id number]) Secondary Dressing: Wound #1 Left,Lateral Lower Leg: Dry Gauze Boardered Foam Dressing Dressing Change Frequency: Wound #1 Left,Lateral Lower Leg: Change dressing every day. Follow-up Appointments: Wound #1 Left,Lateral Lower Leg: Return Appointment in 1 week. Edema Control: Wound #1 Left,Lateral  Lower Leg: Patient to wear own compression stockings Home Health: Wound #1 Left,Lateral Lower Leg: Continue Home Health Visits - [place] Go [facility] to visit patient 2 times weekly, family to change dressing all other days Home Health Nurse may visit PRN to address patient s wound care  needs. FACE TO FACE ENCOUNTER: MEDICARE and MEDICAID PATIENTS: I certify that this patient is under my care and that I had a face-to-face encounter that meets the physician face-to-face encounter requirements with this patient on this date. The encounter with the patient was in whole or in part for the following MEDICAL CONDITION: (primary reason for [place]) MEDICAL NECESSITY: I certify, that based on my findings, NURSING services are a medically necessary home health service. HOME BOUND STATUS: I certify that my clinical findings support that this patient is homebound (i.e., Due to illness or injury, pt requires aid of supportive devices such as crutches, cane, wheelchairs, walkers, the use of special transportation or the assistance of another person to leave their place of residence. There is a normal inability to leave the home and doing so requires considerable and taxing effort. Other absences are for medical reasons / religious services and are infrequent or of short duration when for other reasons). If current dressing causes regression in wound condition, may D/C ordered dressing product/s and apply Normal Saline Moist Dressing daily until next [place] / Other MD appointment. [place] of regression in wound condition at [phone]. Please direct any NON-WOUND related issues/requests for orders to patient's Primary Care Physician Medications-please add to medication list.: Wound #1 Left,Lateral Lower Leg: Santyl Enzymatic Ointment I have recommended: 1. Santyl appointment to be applied daily with a bordered foam. 2. elevation and exercise. 3. good control for diabetes mellitus 4. Adequate intake of protein, vitamin A, vitamin C and zinc 5. Regular visits to the wound center KENSI, KARR ([id number]) Electronic Signature(s) Signed: 02/15/2016 3:49:28 PM By: Christin Fudge MD, FACS Previous Signature: 02/15/2016 3:49:09 PM Version By:  Christin Fudge MD, FACS Previous Signature: 02/15/2016 2:23:01 PM Version By: Christin Fudge MD, FACS Entered By: Christin Fudge on 02/15/2016 15:49:28 [name] ([id number]) -------------------------------------------------------------------------------- SuperBill Details Patient Name: [name]. Date of Service: 02/15/2016 Medical Record Number: [id number] Patient Account Number: [id number] Date of Birth/Sex: May 30, 1940 (75 y.o. Female) Treating RN: Cornell Barman Primary Care Physician: Glendale Chard Other Clinician: Referring Physician: Glendale Chard Treating Physician/Extender: Frann Rider in Treatment: 2 Diagnosis Coding ICD-10 Codes Code Description E11.622 Type 2 diabetes mellitus with other skin ulcer L97.222 Non-pressure chronic ulcer of left calf with fat layer exposed I89.0 Lymphedema, not elsewhere classified Facility Procedures CPT4 Code: 30160109 Description: 32355 - DEB SUBQ TISSUE 20 SQ CM/< ICD-10 Description Diagnosis E11.622 Type 2 diabetes mellitus with other skin ulcer L97.222 Non-pressure chronic ulcer of left calf with fat l I89.0 Lymphedema, not elsewhere classified Modifier: ayer exposed Quantity: 1 CPT4 Code: [id number] Description: 11045 - DEB SUBQ TISS EA ADDL 20CM ICD-10 Description Diagnosis E11.622 Type 2 diabetes mellitus with other skin ulcer L97.222 Non-pressure chronic ulcer of left calf with fat l I89.0 Lymphedema, not elsewhere classified Modifier: ayer exposed Quantity: 1 Physician Procedures CPT4 Code: 2706237 Description: 11042 - WC PHYS SUBQ TISS 20 SQ CM ICD-10 Description Diagnosis E11.622 Type 2 diabetes mellitus with other skin ulcer L97.222 Non-pressure chronic ulcer of left calf with fat la I89.0 Lymphedema, not elsewhere classified Modifier: yer exposed Quantity: 1 CPT4 Code: [id number] [place], [place]O Description: 11045 - WC PHYS SUBQ TISS  EA ADDL 20 CM ICD-10 Description Diagnosis RIS E.  ([id number]) Modifier: Quantity: 1 Electronic Signature(s) Signed: 02/15/2016 2:23:17 PM By: Christin Fudge MD, FACS Entered By: Christin Fudge on 02/15/2016 14:23:17

## 2016-02-16 NOTE — Progress Notes (Signed)
JOZLIN, BENTLY ([id number]) Visit Report for 02/15/2016 Arrival Information Details Patient Name: [name], [name]. Date of Service: 02/15/2016 1:30 PM Medical Record Number: [id number] Patient Account Number: [id number] Date of Birth/Sex: 03-23-1941 (75 y.o. Female) Treating RN: Cornell Barman Primary Care Physician: Glendale Chard Other Clinician: Referring Physician: Glendale Chard Treating Physician/Extender: Frann Rider in Treatment: 2 Visit Information History Since Last Visit Added or deleted any medications: No Patient Arrived: Ambulatory Any new allergies or adverse reactions: No Arrival Time: 13:56 Had a fall or experienced change in No Accompanied By: daughter activities of daily living that may affect Transfer Assistance: None risk of falls: Patient Identification Verified: Yes Signs or symptoms of abuse/neglect since last No Secondary Verification Process Yes visito Completed: Hospitalized since last visit: No Patient Has Alerts: Yes Has Dressing in Place as Prescribed: Yes Patient Alerts: DM II Has Compression in Place as Prescribed: Yes Pain Present Now: No Electronic Signature(s) Signed: 02/15/2016 4:17:19 PM By: Gretta Cool, RN, BSN, Kim RN, BSN Entered By: Gretta Cool, RN, BSN, Kim on 02/15/2016 13:57:00 [name] ([id number]) -------------------------------------------------------------------------------- Encounter Discharge Information Details Patient Name: [name]. Date of Service: 02/15/2016 1:30 PM Medical Record Number: [id number] Patient Account Number: [id number] Date of Birth/Sex: Nov 15, 1940 (75 y.o. Female) Treating RN: Cornell Barman Primary Care Physician: Glendale Chard Other Clinician: Referring Physician: Glendale Chard Treating Physician/Extender: Frann Rider in Treatment: 2 Encounter Discharge Information Items Discharge Pain Level: 0 Discharge Condition: Stable Ambulatory Status: Walker Discharge Destination:  Home Transportation: Private Auto Accompanied By: daughter Schedule Follow-up Appointment: Yes Medication Reconciliation completed Yes and provided to Patient/Care Braydyn Schultes: Provided on Clinical Summary of Care: 02/15/2016 Form Type Recipient Paper Patient DG Electronic Signature(s) Signed: 02/15/2016 4:17:19 PM By: Gretta Cool, RN, BSN, Kim RN, BSN Previous Signature: 02/15/2016 2:19:45 PM Version By: Ruthine Dose Entered By: Gretta Cool RN, BSN, Kim on 02/15/2016 14:20:24 [name] ([id number]) -------------------------------------------------------------------------------- Lower Extremity Assessment Details Patient Name: [name], [name]. Date of Service: 02/15/2016 1:30 PM Medical Record Number: [id number] Patient Account Number: [id number] Date of Birth/Sex: 08-20-1940 (75 y.o. Female) Treating RN: Cornell Barman Primary Care Physician: Glendale Chard Other Clinician: Referring Physician: Glendale Chard Treating Physician/Extender: Frann Rider in Treatment: 2 Edema Assessment Assessed: [Left: No] [Right: No] E[Left: dema] [Right: :] Calf Left: Right: Point of Measurement: 34 cm From Medial Instep 48 cm cm Ankle Left: Right: Point of Measurement: 11 cm From Medial Instep 32.5 cm cm Vascular Assessment Pulses: Posterior Tibial Dorsalis Pedis Palpable: [Left:Yes] Extremity colors, hair growth, and conditions: Extremity Color: [Left:Hyperpigmented] Hair Growth on Extremity: [Left:No] Temperature of Extremity: [Left:Warm] Capillary Refill: [Left:< 3 seconds] Dependent Rubor: [Left:No] Blanched when Elevated: [Left:No] Lipodermatosclerosis: [Left:No] Toe Nail Assessment Left: Right: Thick: No Discolored: No Deformed: No Improper Length and Hygiene: No Electronic Signature(s) Signed: 02/15/2016 4:17:19 PM By: Gretta Cool, RN, BSN, Kim RN, BSN [address] [place] EMarland Kitchen ([id number]) Entered By: Gretta Cool, RN, BSN, Kim on 02/15/2016 14:01:31 [name]  ([id number]) -------------------------------------------------------------------------------- Multi Wound Chart Details Patient Name: [name]. Date of Service: 02/15/2016 1:30 PM Medical Record Number: [id number] Patient Account Number: [id number] Date of Birth/Sex: 12-Dec-1940 (75 y.o. 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 ([id number]) Wound Margin: Distinct, outline attached N/A N/A Granulation Amount: Small (1-33%) N/A N/A Granulation Quality: Red, Hyper-granulation N/A N/A Necrotic Amount: Large (67-100%) N/A N/A Necrotic Tissue: Eschar, Adherent Slough N/A N/A Exposed Structures: Fascia: No N/A N/A Fat: No Tendon: No Muscle: No Joint: No Bone: No Limited to Skin Breakdown Epithelialization: Small (1-33%) N/A N/A Periwound Skin Texture: Edema: Yes N/A N/A Induration: Yes Scarring:  Yes Excoriation: No Callus: No Crepitus: No Fluctuance: No Friable: No Rash: No Periwound Skin Maceration: No N/A N/A Moisture: Moist: No Dry/Scaly: No Periwound Skin Color: Ecchymosis: Yes N/A N/A Erythema: Yes Hemosiderin Staining: Yes Atrophie Blanche: No Cyanosis: No Mottled: No Pallor: No Rubor: No Temperature: No Abnormality N/A N/A Tenderness on Yes N/A N/A Palpation: Wound Preparation: Ulcer Cleansing: N/A N/A Rinsed/Irrigated with Saline Topical Anesthetic Applied: Other: lidocaine 4% Treatment Notes Electronic Signature(s) Signed: 02/15/2016 4:28:24 PM By: Melody Haver, Edwena Felty ([id number]) Entered By: Montey Hora on 02/15/2016 14:10:49 [name] ([id number]) -------------------------------------------------------------------------------- [place] Details Patient Name: [name], [name]. Date of Service: 02/15/2016 1:30 PM Medical Record Number: [id number] Patient Account Number: [id number] Date of Birth/Sex: May 06, 1941 (75 y.o. Female) Treating RN: Montey Hora Primary Care Physician: Glendale Chard Other Clinician: Referring Physician: Glendale Chard Treating Physician/Extender: Frann Rider in Treatment: 2 Active Inactive Abuse / Safety / Falls / Self Care Management Nursing Diagnoses: Impaired physical mobility Potential for falls Goals: Patient will remain injury free Date Initiated: 02/01/2016 Goal Status: Active Interventions: Assess fall risk on admission and as needed Notes: Orientation to the Wound Care Program Nursing Diagnoses: Knowledge deficit related to the wound healing center program Goals: Patient/caregiver will verbalize understanding of the [facility] Date Initiated: 02/01/2016 Goal Status: Active Interventions: Provide education on orientation to the wound center Notes: Wound/Skin Impairment Nursing Diagnoses: Impaired tissue  integrity Goals: Patient/caregiver will verbalize understanding of skin care regimen [name], [name] ([id number]) Date Initiated: 02/01/2016 Goal Status: Active Ulcer/skin breakdown will have a volume reduction of 30% by week 4 Date Initiated: 02/01/2016 Goal Status: Active Ulcer/skin breakdown will have a volume reduction of 50% by week 8 Date Initiated: 02/01/2016 Goal Status: Active Ulcer/skin breakdown will have a volume reduction of 80% by week 12 Date Initiated: 02/01/2016 Goal Status: Active Ulcer/skin breakdown will heal within 14 weeks Date Initiated: 02/01/2016 Goal Status: Active Interventions: Assess patient/caregiver ability to obtain necessary supplies Assess patient/caregiver ability to perform ulcer/skin care regimen upon admission and as needed Assess ulceration(s) every visit Notes: Electronic Signature(s) Signed: 02/15/2016 4:28:24 PM By: Montey Hora Entered By: Montey Hora on 02/15/2016 14:10:29 [name] ([id number]) -------------------------------------------------------------------------------- Pain Assessment Details Patient Name: [name]. Date of Service: 02/15/2016 1:30 PM Medical Record Number: [id number] Patient Account Number: [id number] Date of Birth/Sex: May 19, 1941 (75 y.o. Female) Treating RN: Cornell Barman Primary Care Physician: Glendale Chard Other Clinician: Referring Physician: Glendale Chard Treating Physician/Extender: Frann Rider in Treatment: 2 Active Problems  Location of Pain Severity and Description of Pain Patient Has Paino No Site Locations With Dressing Change: No Pain Management and Medication Current Pain Management: Electronic Signature(s) Signed: 02/15/2016 4:17:19 PM By: Gretta Cool, RN, BSN, Kim RN, BSN Entered By: Gretta Cool, RN, BSN, Kim on 02/15/2016 13:57:26 [name] ([id number]) -------------------------------------------------------------------------------- Patient/Caregiver Education  Details Patient Name: [name] Date of Service: 02/15/2016 1:30 PM Medical Record Number: [id number] Patient Account Number: [id number] Date of Birth/Gender: Oct 05, 1940 (75 y.o. Female) Treating RN: Cornell Barman Primary Care Physician: Glendale Chard Other Clinician: Referring Physician: Glendale Chard Treating Physician/Extender: Frann Rider in Treatment: 2 Education Assessment Education Provided To: Patient Education Topics Provided Wound/Skin Impairment: Handouts: Caring for Your Ulcer, Other: continue daily wound care as prescribed Methods: Demonstration Responses: State content correctly Electronic Signature(s) Signed: 02/15/2016 4:17:19 PM By: Gretta Cool, RN, BSN, Kim RN, BSN Entered By: Gretta Cool, RN, BSN, Kim on 02/15/2016 14:20:51 [name] ([id number]) -------------------------------------------------------------------------------- Wound Assessment Details Patient Name: [name]. Date of Service: 02/15/2016 1:30 PM Medical Record Number: [id number] Patient Account Number: [id number] Date of Birth/Sex: 1940/12/23 (75 y.o. Female) Treating RN: Cornell Barman Primary Care Physician: Glendale Chard Other Clinician: Referring Physician: Glendale Chard Treating Physician/Extender: Frann Rider in Treatment: 2 Wound Status Wound Number: 1 Primary Diabetic Wound/Ulcer of the Lower Etiology: Extremity Wound Location: Left Lower Leg - Lateral Secondary Auto-immune Wounding Event: Trauma Etiology: Date Acquired: 12/27/2015 Wound Open Weeks Of Treatment: 2 Status: Clustered Wound: No Comorbid Cataracts, Lymphedema, History: Hypertension, Type II Diabetes, Gout, Rheumatoid Arthritis, Osteoarthritis, Neuropathy Photos Wound Measurements Length: (cm) 5 Width: (cm) 4.6 Depth: (cm) 0.3 Area: (cm) 18.064 Volume: (cm) 5.419 % Reduction in Area: 8.7% % Reduction in Volume: -36.9% Epithelialization: Small (1-33%) Tunneling: No Undermining:  No Wound Description Classification: Grade 2 Wound Margin: Distinct, outline attached Exudate Amount: Medium Exudate Type: Sanguinous Exudate Color: red Wound Bed Granulation Amount: Small (1-33%) Exposed Structure Granulation Quality: Red, Hyper-granulation Fascia Exposed: No Kerekes, [name] ([id number]) Necrotic Amount: Large (67-100%) Fat Layer Exposed: No Necrotic Quality: Eschar, Adherent Slough Tendon Exposed: No Muscle Exposed: No Joint Exposed: No Bone Exposed: No Limited to Skin Breakdown Periwound Skin Texture Texture Color No Abnormalities Noted: No No Abnormalities Noted: No Callus: No Atrophie Blanche: No Crepitus: No Cyanosis: No Excoriation: No Ecchymosis: Yes Fluctuance: No Erythema: Yes Friable: No Hemosiderin Staining: Yes Induration: Yes Mottled: No Localized Edema: Yes Pallor: No Rash: No Rubor: No Scarring: Yes Temperature / Pain Moisture Temperature: No Abnormality No Abnormalities Noted: No Tenderness on Palpation: Yes Dry / Scaly: No Maceration: No Moist: No Wound Preparation Ulcer Cleansing: Rinsed/Irrigated with Saline Topical Anesthetic Applied: Other: lidocaine 4%, Treatment Notes Wound #1 (Left, Lateral Lower Leg) 1. Cleansed with: Clean wound with Normal Saline 2. Anesthetic Topical Lidocaine 4% cream to wound bed prior to debridement 4. Dressing Applied: Santyl Ointment 5. Secondary Dressing Applied Bordered Foam Dressing Electronic Signature(s) Signed: 02/15/2016 4:17:19 PM By: Gretta Cool, RN, BSN, Kim RN, BSN Entered By: Gretta Cool, RN, BSN, Kim on 02/15/2016 14:02:34 [name] ([id number]) -------------------------------------------------------------------------------- [place] Details Patient Name: [name] Date of Service: 02/15/2016 1:30 PM Medical Record Number: [id number] Patient Account Number: [id number] Date of Birth/Sex: 1940-11-03 (75 y.o. Female) Treating RN: Cornell Barman Primary Care Physician:  Glendale Chard Other Clinician: Referring Physician: Glendale Chard Treating Physician/Extender: Frann Rider in Treatment: 2 Vital Signs Time Taken: 13:57 Temperature (F): 97.9 Pulse (bpm): 103 Respiratory Rate (breaths/min): 18 Blood Pressure (mmHg): 135/72 Reference Range: 80 - 120 mg / dl  Electronic Signature(s) Signed: 02/15/2016 4:17:19 PM By: Gretta Cool, RN, BSN, Kim RN, BSN Entered By: Gretta Cool, RN, BSN, Kim on 02/15/2016 13:57:53

## 2016-02-18 ENCOUNTER — Ambulatory Visit (INDEPENDENT_AMBULATORY_CARE_PROVIDER_SITE_OTHER): Payer: Medicare Other | Admitting: Internal Medicine

## 2016-02-18 ENCOUNTER — Encounter: Payer: Self-pay | Admitting: Internal Medicine

## 2016-02-18 VITALS — BP 108/68 | HR 89 | Ht 64.0 in | Wt 293.0 lb

## 2016-02-18 DIAGNOSIS — J849 Interstitial pulmonary disease, unspecified: Secondary | ICD-10-CM | POA: Diagnosis not present

## 2016-02-18 DIAGNOSIS — J479 Bronchiectasis, uncomplicated: Secondary | ICD-10-CM | POA: Diagnosis not present

## 2016-02-18 MED ORDER — FLUTICASONE PROPIONATE 50 MCG/ACT NA SUSP: 2.0000 | Freq: Every day | NASAL | 3 refills | Status: DC

## 2016-02-18 NOTE — Patient Instructions (Addendum)
ICD-9-CM ICD-10-CM   1. ILD (interstitial lung disease) ([place]) 515 J84.9   2. Bronchiectasis without complication ([facility]) 448.1 J47.9   3. Morbid obesity, unspecified obesity type ([place]) 278.01 E66.01     Glad you are better with home PT and  Azithromycin 3 times per week from [facility] Too bad you could not do pulmonary rehab at [facility] due to knee pain Glad you are going to  [facility] Immunosuppressants through rheumatology at [facility] (plaquenil, allopurino, prednisonel) Will send script for flonase Glad you had flu shot  followup 3 months or sooner if needed with Dr Chase Caller

## 2016-02-18 NOTE — Progress Notes (Signed)
Subjective:     Patient ID: [name], female   DOB: 1941/02/21, 75 y.o.   MRN: [id number]  HPI     OV 01/03/2015  Chief Complaint  Patient presents with  . Follow-up    Pt c/o DOE, resolving cough that started over the weekend, chest discomfort when SOB and when lying down. Pt c/o increasing fatigue.    She has severe class 3 dyspnea with hypoxemia on account of BMI 50, diaast dsyfn, chronic pain, deconditioning and ILD due to RA on celcept/pred/bactrim   I personally last saw her in march 2-016. Since then has seen other provicers in this office and most recently in June 2016. In June 2016 admitted for acute CHF - cath showed mild pulm htn only with PA mean 24 and minimal CAD (reviewed chart of procedures June 2016). Dx as acute diast chf. Since then better. SHe had fu HRCT 11/29/14 that shows progression in ILD since march 2016 and now with UIP pattern (personally visualized image). She is finally accepting of fact that ILD is not makin cause of dyspnea and obesity is main problem. She is wondering about dc cellcept/pred/bactrim so as to minimize polypharmacy and side effect profile    OV 02/09/2015  Chief Complaint  Patient presents with  . Follow-up    Pt states her breathing is doing well the past few days. Pt recently went to [facility] ED for CP. Pt c/o prod cough with tan mucus. Pt denies CP/tightness    She has severe class 3 dyspnea with hypoxemia on account of BMI 50, diaast dsyfn, chronic pain, deconditioning and ILD due to RA. She is having progressive ILD. But given her miserable quality of life and polypharmacy and other side effects we decided to stop the CellCept and Bactrim. We also advise prednisone taper. This was all in the end of August 2016. But on 01/27/2015 she ended up in the emergency room with some respiratory difficulty. Her prednisone was bumped up. She subsequently saw her rheumatologist [name] was now tapering her prednisone again. There is also some concerns  of headaches and possibly temporal arteritis based on her description and apparently some kind of a biopsy scheduled. She feels that since stopping her CellCept and Bactrim her cough is returned. Nevertheless weight and knee pain and back pain continued to be major issues for her along with polypharmacy. Her Polysorb was includes MS Contin  [name]    OV 02/21/2015  - a ACUTE VISIT  Chief Complaint  Patient presents with  . Acute Visit    Pt c/o of chest tightness, productive cough with white/yellow mucus, and wheezing. Pt also c/o of feeling abdominal swelling/distention. Pt also c/o of constant fatigue. Pt states symptoms have been present x1 week.     Acute visit for this morbidly obese, rheumatoid arthritis patient with ILD who is now off CellCept  I saw her as recently as 02/09/2015. She reports that for the past 1 week she's having increased chest congestion, chest tightness, wheezing, shortness of breath, cough and yellow  sputum more than baseline. Symptoms are rated as moderate in severity. Relieved by inhaler. She has tried Mucinex for 1 week with some initial partial relief but symptoms progressed. So she made an acute visit.  She continues to stay off CellCept. She is not on prednisone 10 mg per day. She tells me [name] has held her prednisone at the dose because of concerns of temporal arteritis but apparently  now that his diagnoses being eliminated in the differential diagnosis. She will see [name] in the next few weeks to decide on further prednisone taper.    OV 04/16/2015  Chief Complaint  Patient presents with  . Follow-up    Pt states she felt much better after completing the abx and pred. Pt states her SOB is at basline, prod cough with yellow mucus at times, pt c/o chest tightness.     Morbidly obese female with rheumatoid arthritis with interstitial lung disease UIP pattern with associated deconditioning and diastolic dysfunction  She  continues to remain off CellCept. Last seen in October 2016. At that time I discharged her from the office with doxycycline and prednisone taper. After that early November 2016 she was given another course of Doxy and prednisone burst. This did not help. Sputum culture at this time showed normal flora. She called again with the middle of November was given Levaquin and prednisone burst. She feels Levaquin helped her immensely. At this point in time she is reporting progressive dyspnea and also worsening arthritis. She wants to go back on immunomodulators again. She has discussed Imuran with [name]. She is due to see [name] tomorrow. She also feels that she is unable to come off prednisone. She feels at a minimum she needs 10 mg of prednisone per day. She will discuss this with Dr. D her rheumatologist tomorrow. She's also interested in a second opinion at [facility] interstitial lung disease clinic which I did discuss with her most recently. She continues to use oxygen.     OV 07/17/2015  Chief Complaint  Patient presents with  . Follow-up    Pt states that she has improved. Pt c/o continued SOB with exertion, some ocugh and wheeze, and some intermittent chest tightness. Pt states that she does sleep a lot. Pt states that her heart rate remains high and that cardiology said it was due to her lungs. Pt is currently on Lasix, 67m, but still seems to retain a lot of fluid and is not seeming to go to the bathroom as frequently.    Follow-up interstitial lung disease secondary to rheumatoid arthritis in the setting of morbid obesity, poor quality of life and heavy chronic pain opioid use and depressive symptoms  Last seen November 2016. Since then she has seen [name] rheumatology clinic. She was given instructions on Imuran which she has red but she has not decided whether she should take it or not. She subsequently has followed up with [facility] interstitial lung disease clinic. CT scan of the chest reviewed result  shows possible UIP pattern 06/12/2015. She had autoimmune profile which was strongly positive for both rheumatoid factor and cyclic citrulline peptide both markers for rheumatoid arthritis. She did see Dr. CMargreta JourneyB on 07/02/2015. It appears that the note is not complete but according to the patient was supposed to be a multidisciplinary conference and they will get back to her. It is possible a second immunomodulators agent and is being contemplated but patient is leery of this given prior issues with opportunistic infection not otherwise specified and admissions to the hospital. In the interim she is also seen cardiology Janr 2017 Dr. BGwenlyn Foundwho did not feel dyspnea was cardiac related. I reviewed his note  OV 10/17/2015  Chief Complaint  Patient presents with  . Follow-up    Pt reports she feels improved. Pt c/o continued SOB with exertion, some wheeze and occasional cough. Pt denies CP/tightness.  Follow-up interstitial lung disease secondary to rheumatoid arthritis in the setting of morbid obesity, poor quality of life and heavy chronic pain opioid use and depressive symptoms   Since seeing me last in February 2017. She followed up at [facility] pulmonary clinic in March 2017. I reviewed those notes. She saw Dr. Margreta Journey B on 07/31/2015. Due to the presence of bronchiectasis she's been started on  azithromycin 3 times a week. She says this has helped. In addition due to physical deconditioning and obesity she is getting home physical therapy is also help. She is interested in pulmonary rehabilitation although given obesity and back pain can be challenging. She nevertheless wants to try. She is on daily prednisone 10 mg a day. She met with [name] in the rheumatology clinic and has been referred now to [facility] rheumatology clinic for second opinion for a second immunomodulators. She continues to be morbidly obese and has been losing weight. She is somewhat interested in visiting with a  [facility].   OV 02/18/2016  Chief Complaint  Patient presents with  . Follow-up    Pt states she did not go to pulm rehab d/t BIL knee pain. Pt denies change in SOB, significant cough, CP/tightness and f/c/s. Pt states she is exercising at home. pt states she is doing well overall.    Problem List: 1. Extensive bronchiectasis - cylindrical vs. Traction - has been present and not significantly changed from 2014 to 2017 - has history of hospitalization for what sounds like pneumonia vs. Bronchiectasis flare as far back as 1999; underwent bronchoscopy which made her feel "like a new woman" in 1999 2. Pulmonary fibrosis - basilar predominant reticular opacities that have not change from 2014 to 2017 3. Rheumatoid arthritis: RF 959 ; CCP >300 - has been treated with methotrexate, biologics, etc in the past - none in the past several years - currently on 7.5/18m alter day 4. Morbid obesity with deconditioning - has had several hospitalizations in recent months; very deconditioned at present time  - [facility]   Followup for above  - [facility]. [facility] Review of the notes suggest that they have her on a slightly lower dose of prednisone with intent to taper to 5 mg per day. He also have her on hydroxychloroquine. No further disease modifying agents are being considered for rheumatoid arthritis. In terms of her interstitial lung disease and bronchiectasis she's on conservative therapy along with Activella device. This is helping and she feels better. Today she came in walking with her walker. This the happiest I have f seen her. She's not lost any weight but [facility]. She tried to go to pulmonary rehabilitation  but could not because of knee pain. She is up-to-date with pneumonia vaccine. She's had a flu shot this season.      has a past medical history of Arthritis; Asthma; Carotid artery occlusion; Chronic kidney disease; Clotting disorder ([place]; Contusion of left knee; COPD (chronic obstructive pulmonary disease) ([place]; Depression, reactive; Diabetes mellitus; Diastolic dysfunction; Family history of heart disease; High cholesterol; Hypertension; Interstitial lung disease ([place]; 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 ([place] (02/17/2012); Morbid obesity ([place]; Normal coronary arteries; RA (rheumatoid arthritis) ([place]; Shortness of breath; Thyroid disease; and URI (upper respiratory infection).   reports that she has never smoked. She has  never used smokeless tobacco.  Past Surgical History:  Procedure Laterality Date  . ABDOMINAL HYSTERECTOMY    . BRAIN SURGERY     Gamma knife 10/13. Needs repeat spring  '14  . CARDIAC CATHETERIZATION N/A 10/31/2014   Procedure: Right/Left Heart Cath and Coronary Angiography;  Surgeon: [name], MD;  Location: [facility];  Service: Cardiovascular;  Laterality: N/A;  . ESOPHAGOGASTRODUODENOSCOPY (EGD) WITH PROPOFOL N/A 09/14/2014   Procedure: ESOPHAGOGASTRODUODENOSCOPY (EGD) WITH PROPOFOL;  Surgeon: [name], MD;  Location: [facility];  Service: Endoscopy;  Laterality: N/A;  . OVARY SURGERY    . SHOULDER SURGERY Left   . TONSILLECTOMY  age 51  . VIDEO BRONCHOSCOPY Bilateral 05/31/2013   Procedure: VIDEO BRONCHOSCOPY WITHOUT FLUORO;  Surgeon: Brand Males, MD;  Location: [place];  Service: Cardiopulmonary;  Laterality: Bilateral;  . video bronscoscopy  2000   lung    Allergies  Allergen Reactions  . Codeine Swelling    Facial swelling  . Remicade [Infliximab] Anaphylaxis    "sent me into shock"  . Zestril [Lisinopril] Swelling    Face and neck swelling    Immunization History  Administered Date(s)  Administered  . Influenza Split 01/29/2012, 03/30/2013, 02/10/2014  . Influenza,inj,Quad PF,36+ Mos 02/09/2015, 12/18/2015  . PPD Test 04/23/2015  . Pneumococcal Conjugate-13 08/21/2014  . Pneumococcal Polysaccharide-23 02/17/2012  . Tdap 12/27/2015    Family History  Problem Relation Age of Onset  . Diabetes Mother   . Heart attack Mother   . Hypertension Father   . Lung cancer Father   . Diabetes Sister   . Diabetes Brother   . Hypertension Brother   . Heart disease Brother   . Heart attack Brother   . Kidney cancer Brother   . Uterine cancer Daughter   . Breast cancer Sister   . Rheum arthritis Maternal Uncle   . Gout Brother   . Kidney failure Brother     x 5     Current Outpatient Prescriptions:  .  ACCU-CHEK SMARTVIEW test strip, 1 each by Other route every morning. , Disp: , Rfl: 1 .  acetaminophen (TYLENOL) 500 MG tablet, Take 1,000 mg by mouth every 6 (six) hours as needed for moderate pain. , Disp: , Rfl:  .  albuterol (PROVENTIL HFA;VENTOLIN HFA) 108 (90 BASE) MCG/ACT inhaler, Inhale 2 puffs into the lungs every 6 (six) hours as needed for wheezing or shortness of breath., Disp: , Rfl:  .  allopurinol (ZYLOPRIM) 300 MG tablet, Take by mouth., Disp: , Rfl:  .  aspirin EC 81 MG tablet, Take 81 mg by mouth every morning. , Disp: , Rfl:  .  atorvastatin (LIPITOR) 20 MG tablet, Take 10 mg by mouth every evening. , Disp: , Rfl:  .  azithromycin (ZITHROMAX) 500 MG tablet, Take 1 tablet by mouth daily. TAKE ON MON, WED & FRI, Disp: , Rfl: 0 .  Calcium Carb-Cholecalciferol (CALCIUM + D3 PO), Take 2 tablets by mouth daily with lunch. , Disp: , Rfl:  .  cholecalciferol (VITAMIN D) 1000 UNITS tablet, Take 2,000 Units by mouth daily with lunch. , Disp: , Rfl:  .  collagenase (SANTYL) ointment, Apply 1 application topically daily., Disp: , Rfl:  .  cyclobenzaprine (FLEXERIL) 5 MG tablet, Take 1 tablet (5 mg total) by mouth at bedtime as needed for muscle spasms., Disp: 30  tablet, Rfl: 1 .  diclofenac sodium (VOLTAREN) 1 % GEL, Apply 1 application topically 3 (three) times daily., Disp: 3 Tube,  Rfl: 4 .  DULoxetine (CYMBALTA) 60 MG capsule, Take 1 capsule (60 mg total) by mouth daily., Disp: 30 capsule, Rfl: 4 .  famotidine (PEPCID) 20 MG tablet, Take 1 tablet (20 mg total) by mouth at bedtime., Disp: 30 tablet, Rfl: 11 .  fluticasone (FLONASE) 50 MCG/ACT nasal spray, Place 2 sprays into both nostrils daily., Disp: , Rfl:  .  furosemide (LASIX) 40 MG tablet, Take 1 tablet (40 mg total) by mouth 2 (two) times daily. (Patient taking differently: Take 20 mg by mouth daily. ), Disp: 60 tablet, Rfl: 11 .  gabapentin (NEURONTIN) 600 MG tablet, Take two in the morning , Two mid- afternoon and two at bedtime, Disp: 540 tablet, Rfl: 0 .  guaiFENesin (MUCINEX) 600 MG 12 hr tablet, Take 1,200 mg by mouth 2 (two) times daily., Disp: , Rfl:  .  hydroxychloroquine (PLAQUENIL) 200 MG tablet, Take 200 mg by mouth daily., Disp: , Rfl:  .  levothyroxine (SYNTHROID, LEVOTHROID) 50 MCG tablet, Take 1 tablet (50 mcg total) by mouth daily., Disp: 30 tablet, Rfl: 1 .  metoprolol succinate (TOPROL-XL) 100 MG 24 hr tablet, Take 100 mg by mouth every morning. Take with or immediately following a meal., Disp: , Rfl:  .  morphine (MS CONTIN) 15 MG 12 hr tablet, Take 1 tablet (15 mg total) by mouth every 12 (twelve) hours., Disp: 60 tablet, Rfl: 0 .  morphine (MSIR) 15 MG tablet, Take 1 tablet (15 mg total) by mouth every 8 (eight) hours as needed for severe pain., Disp: 90 tablet, Rfl: 0 .  Multiple Vitamin (MULTIVITAMIN WITH MINERALS) TABS, Take 1 tablet by mouth daily with lunch. , Disp: , Rfl:  .  olmesartan-hydrochlorothiazide (BENICAR HCT) 40-25 MG tablet, Take 1 tablet by mouth daily. , Disp: , Rfl:  .  OXYGEN-HELIUM IN, Inhale 2 L into the lungs at bedtime. And on exertion, Disp: , Rfl:  .  pantoprazole (PROTONIX) 40 MG tablet, take 1 tablet by mouth once daily MINUTES BEFORE 1ST MEAL OF  THE DAY, Disp: 30 tablet, Rfl: 5 .  predniSONE (DELTASONE) 10 MG tablet, Take 7.5 mg by mouth every morning. Alternating 53m and 7.592m Disp: , Rfl: 0 .  sodium chloride (OCEAN) 0.65 % SOLN nasal spray, Place 1 spray into both nostrils as needed for congestion., Disp: , Rfl:  .  Spacer/Aero-Holding Chambers (AEROCHAMBER MV) inhaler, Use as instructed, Disp: 1 each, Rfl: 0 .  VICTOZA 18 MG/3ML SOPN, Inject 1.8 mg as directed daily. , Disp: , Rfl: 0    Review of Systems     Objective:   Physical Exam  Constitutional: She is oriented to person, place, and time. She appears well-developed and well-nourished. No distress.  Body mass index is 50.29 kg/m.   HENT:  Head: Normocephalic and atraumatic.  Right Ear: External ear normal.  Left Ear: External ear normal.  Mouth/Throat: Oropharynx is clear and moist. No oropharyngeal exudate.  Eyes: Conjunctivae and EOM are normal. Pupils are equal, round, and reactive to light. Right eye exhibits no discharge. Left eye exhibits no discharge. No scleral icterus.  Neck: Normal range of motion. Neck supple. No JVD present. No tracheal deviation present. No thyromegaly present.  Cardiovascular: Normal rate, regular rhythm, normal heart sounds and intact distal pulses.  Exam reveals no Mcdaniel and no friction rub.   No murmur heard. Pulmonary/Chest: Effort normal. No respiratory distress. She has no wheezes. She has rales. She exhibits no tenderness.  Abdominal: Soft. Bowel sounds are normal. She exhibits  no distension and no mass. There is no tenderness. There is no rebound and no guarding.  Musculoskeletal: Normal range of motion. She exhibits no edema or tenderness.  Uses walker  Lymphadenopathy:    She has no cervical adenopathy.  Neurological: She is alert and oriented to person, place, and time. She has normal reflexes. No cranial nerve deficit. She exhibits normal muscle tone. Coordination normal.  Skin: Skin is warm and dry. No rash noted. She is  not diaphoretic. No erythema. No pallor.  Psychiatric: She has a normal mood and affect. Her behavior is normal. Judgment and thought content normal.  Vitals reviewed.   Vitals:   02/18/16 1417  BP: 108/68  Pulse: 89  SpO2: 95%  Weight: 293 lb (132.9 kg)  Height: _0  (1.626 m)   Estimated body mass index is 50.29 kg/m as calculated from the following:   Height as of this encounter: _1  (1.626 m).   Weight as of this encounter: 293 lb (132.9 kg).      Assessment:       ICD-9-CM ICD-10-CM   1. ILD (interstitial lung disease) ([place]) 515 J84.9   2. Bronchiectasis without complication (HCC) 982.8 J47.9   3. Morbid obesity, unspecified obesity type ([place]) 278.01 E66.01        Plan:      Glad you are better with home PT and  [facility] Too bad you could not do pulmonary rehab at [place] due to knee pain Glad you are going to  [place] [facility] for weight loss Immunosuppressants through rheumatology at [place] (plaquenil, allopurino, prednisonel) Will send script for flonase Glad you had flu shot  followup 3 months or sooner if needed with Dr Chase Caller After this we will alternate pulmonary visits at [facility] every 6 months such that she is seeing some pulmonologist every 3 months   She verbalizes understanding and is agreeable with the plan  Dr. Brand Males, M.D., [facility].C.P Pulmonary and Critical Care Medicine Staff Physician [facility] Pager: [phone], If no answer or between  15:00h - 7:00h: call [phone]  02/18/2016 2:49 PM

## 2016-02-18 NOTE — Addendum Note (Signed)
Addended by: Virl Cagey on: 02/18/2016 03:17 PM   Modules accepted: Orders

## 2016-02-19 DIAGNOSIS — E1140 Type 2 diabetes mellitus with diabetic neuropathy, unspecified: Secondary | ICD-10-CM | POA: Diagnosis not present

## 2016-02-19 DIAGNOSIS — I130 Hypertensive heart and chronic kidney disease with heart failure and stage 1 through stage 4 chronic kidney disease, or unspecified chronic kidney disease: Secondary | ICD-10-CM | POA: Diagnosis not present

## 2016-02-19 DIAGNOSIS — I5032 Chronic diastolic (congestive) heart failure: Secondary | ICD-10-CM | POA: Diagnosis not present

## 2016-02-19 DIAGNOSIS — E1129 Type 2 diabetes mellitus with other diabetic kidney complication: Secondary | ICD-10-CM | POA: Diagnosis not present

## 2016-02-19 DIAGNOSIS — T798XXD Other early complications of trauma, subsequent encounter: Secondary | ICD-10-CM | POA: Diagnosis not present

## 2016-02-19 DIAGNOSIS — S81812D Laceration without foreign body, left lower leg, subsequent encounter: Secondary | ICD-10-CM | POA: Diagnosis not present

## 2016-02-21 DIAGNOSIS — I5032 Chronic diastolic (congestive) heart failure: Secondary | ICD-10-CM | POA: Diagnosis not present

## 2016-02-21 DIAGNOSIS — T798XXD Other early complications of trauma, subsequent encounter: Secondary | ICD-10-CM | POA: Diagnosis not present

## 2016-02-21 DIAGNOSIS — I130 Hypertensive heart and chronic kidney disease with heart failure and stage 1 through stage 4 chronic kidney disease, or unspecified chronic kidney disease: Secondary | ICD-10-CM | POA: Diagnosis not present

## 2016-02-21 DIAGNOSIS — E1129 Type 2 diabetes mellitus with other diabetic kidney complication: Secondary | ICD-10-CM | POA: Diagnosis not present

## 2016-02-21 DIAGNOSIS — E1140 Type 2 diabetes mellitus with diabetic neuropathy, unspecified: Secondary | ICD-10-CM | POA: Diagnosis not present

## 2016-02-21 DIAGNOSIS — S81812D Laceration without foreign body, left lower leg, subsequent encounter: Secondary | ICD-10-CM | POA: Diagnosis not present

## 2016-02-22 ENCOUNTER — Encounter: Payer: Medicare Other | Attending: Surgery | Admitting: Nurse Practitioner

## 2016-02-22 DIAGNOSIS — E11622 Type 2 diabetes mellitus with other skin ulcer: Secondary | ICD-10-CM | POA: Insufficient documentation

## 2016-02-22 DIAGNOSIS — J449 Chronic obstructive pulmonary disease, unspecified: Secondary | ICD-10-CM | POA: Insufficient documentation

## 2016-02-22 DIAGNOSIS — E1122 Type 2 diabetes mellitus with diabetic chronic kidney disease: Secondary | ICD-10-CM | POA: Diagnosis not present

## 2016-02-22 DIAGNOSIS — M069 Rheumatoid arthritis, unspecified: Secondary | ICD-10-CM | POA: Insufficient documentation

## 2016-02-22 DIAGNOSIS — L97222 Non-pressure chronic ulcer of left calf with fat layer exposed: Secondary | ICD-10-CM | POA: Diagnosis not present

## 2016-02-22 DIAGNOSIS — N189 Chronic kidney disease, unspecified: Secondary | ICD-10-CM | POA: Diagnosis not present

## 2016-02-22 DIAGNOSIS — I129 Hypertensive chronic kidney disease with stage 1 through stage 4 chronic kidney disease, or unspecified chronic kidney disease: Secondary | ICD-10-CM | POA: Insufficient documentation

## 2016-02-22 DIAGNOSIS — I890 Lymphedema, not elsewhere classified: Secondary | ICD-10-CM | POA: Insufficient documentation

## 2016-02-22 DIAGNOSIS — E1140 Type 2 diabetes mellitus with diabetic neuropathy, unspecified: Secondary | ICD-10-CM | POA: Insufficient documentation

## 2016-02-22 DIAGNOSIS — Z79899 Other long term (current) drug therapy: Secondary | ICD-10-CM | POA: Insufficient documentation

## 2016-02-23 NOTE — Progress Notes (Addendum)
[name], [name] ([id number]) Visit Report for 02/22/2016 Chief Complaint Document Details Patient Name: [name], [name] 02/22/2016 12:45 Date of Service: PM Medical Record [id number] Number: Patient Account Number: [id number] Jul 13, 1940 (75 y.o. Treating RN: Cornell Barman Date of Birth/Sex: Female) Other Clinician: Primary Care Physician: Glendale Chard Treating Londell Moh Referring Physician: Glendale Chard Physician/Extender: Suella Grove in Treatment: 3 Information Obtained from: Patient Chief Complaint Patients presents for treatment of an open diabetic ulcer the left lower extremity which she's had for about 4 weeks Electronic Signature(s) Signed: 02/22/2016 4:21:25 PM By: Londell Moh FNP Entered By: Londell Moh on 02/22/2016 13:54:08 [name] ([id number]) -------------------------------------------------------------------------------- Debridement Details Patient Name: [name]. 02/22/2016 12:45 Date of Service: PM Medical Record [id number] Number: Patient Account Number: [id number] Sep 19, 1940 (75 y.o. Treating RN: Montey Hora Date of Birth/Sex: Female) Other Clinician: Primary Care Physician: Glendale Chard Treating Londell Moh Referring Physician: Glendale Chard Physician/Extender: Suella Grove in Treatment: 3 Debridement Performed for Wound #1 Left,Lateral Lower Leg Assessment: Performed By: Physician Londell Moh, NP Debridement: Debridement Pre-procedure Yes - 13:24 Verification/Time Out Taken: Start Time: 13:24 Pain Control: Lidocaine 4% Topical Solution Level: Skin/Subcutaneous Tissue Total Area Debrided (L x 5.3 (cm) x 4.4 (cm) = 23.32 (cm) W): Tissue and other Viable, Non-Viable, Eschar, Fibrin/Slough, Subcutaneous material debrided: Instrument: Curette Bleeding: Minimum Hemostasis Achieved: Pressure End Time: 13:29 Procedural Pain: 0 Post Procedural Pain: 0 Response to Treatment: Procedure was tolerated  well Post Debridement Measurements of Total Wound Length: (cm) 5.3 Width: (cm) 4.4 Depth: (cm) 0.5 Volume: (cm) 9.158 Character of Wound/Ulcer Post Improved Debridement: Severity of Tissue Post Debridement: Fat layer exposed Post Procedure Diagnosis Same as Pre-procedure Electronic Signature(s) Signed: 02/22/2016 4:21:25 PM By: Londell Moh FNP Signed: 02/22/2016 5:08:51 PM By: Durwin Nora ([id number]) Entered By: Montey Hora on 02/22/2016 13:28:47 [name] ([id number]) -------------------------------------------------------------------------------- HPI Details Patient Name: [name] 02/22/2016 12:45 Date of Service: PM Medical Record [id number] Number: Patient Account Number: [id number] 05/06/1941 (75 y.o. Treating RN: Cornell Barman Date of Birth/Sex: Female) Other Clinician: Primary Care Physician: Glendale Chard Treating Londell Moh Referring Physician: Glendale Chard Physician/Extender: Suella Grove in Treatment: 3 History of Present Illness Location: left lower extremity lateral calf Quality: Patient reports experiencing a dull pain to affected area(s). Severity: Patient states wound are getting worse. Duration: Patient has had the wound for < 4 weeks prior to presenting for treatment Timing: Pain in wound is Intermittent (comes and goes Context: The wound occurred when the patient was injured with a sharp corner for car door Modifying Factors: Other treatment(s) tried include:local care and 3 courses of antibiotics including Keflex, Bactrim and doxycycline Associated Signs and Symptoms: Patient reports having increase swelling. HPI Description: 75 year old patient with a past medical history of diabetes mellitus, chronic kidney disease, COPD, hypertension, rheumatoid arthritis had a lacerated wound to her left calf when she injured it on the corner of a car door and it was a long 12 cm laceration. x-ray done at the time  revealed that there was no fracture or radiopaque foreign body. the wound was sutured with 4-0 Prolene simple interrupted sutures and a total of 14 sutures were applied. on August 31 she was seen in the ER and it was noted that the wound had dehisced and she was treated with Keflex. She has completed a course of doxycycline recently Medical history also significant for status post abdominal hysterectomy, brain surgery, cardiac catheterization, EGD and bronchoscopy. of late she was back in the ER and was seen last  on August 31 and they tried Silvadene 02/22/16: pt reports discomfort and mild pain just below her wound. however, she admits on legs more than usual. no redness, foul odor or purulence. the wound is measuring slightly larger today, but likely secondary to poor edema control. nursing staff gave information to pt today regarding purchase of her compression stockings. she was encouraged to purchase soon. Electronic Signature(s) Signed: 02/22/2016 4:21:25 PM By: Londell Moh FNP Entered By: Londell Moh on 02/22/2016 13:55:21 [name] ([id number]) -------------------------------------------------------------------------------- Physical Exam Details Patient Name: [name], [name] 02/22/2016 12:45 Date of Service: PM Medical Record [id number] Number: Patient Account Number: [id number] 1940/09/26 (75 y.o. Treating RN: Cornell Barman Date of Birth/Sex: Female) Other Clinician: Primary Care Physician: Glendale Chard Treating Londell Moh Referring Physician: Glendale Chard Physician/Extender: Suella Grove in Treatment: 3 Constitutional morbidly obese. NAD. Ears, Nose, Mouth, and Throat Patient can hear normal speaking tones without difficulty.[name] Respiratory Respiratory effort is easy and symmetric bilaterally. Rate is normal at rest and on room air.. Cardiovascular increased edema in the left lower leg.[name] Psychiatric good insight, poor judgment recently. Alert and  oriented times 3.. Short and long term memory intact.. No evidence of depression, anxiety, or agitation. Calm, cooperative, and communicative. Appropriate interactions and affect.. Electronic Signature(s) Signed: 02/22/2016 4:21:25 PM By: Londell Moh FNP Entered By: Londell Moh on 02/22/2016 13:56:14 [name] ([id number]) -------------------------------------------------------------------------------- Physician Orders Details Patient Name: [name], [name] 02/22/2016 12:45 Date of Service: PM Medical Record [id number] Number: Patient Account Number: [id number] 1940-07-14 (75 y.o. Treating RN: Montey Hora Date of Birth/Sex: Female) Other Clinician: Primary Care Physician: Glendale Chard Treating Londell Moh Referring Physician: Glendale Chard Physician/Extender: Suella Grove in Treatment: 3 Verbal / Phone Orders: Yes Clinician: Montey Hora Read Back and Verified: Yes Diagnosis Coding Wound Cleansing Wound #1 Left,Lateral Lower Leg o Clean wound with Normal Saline. o May Shower, gently pat wound dry prior to applying new dressing. Anesthetic Wound #1 Left,Lateral Lower Leg o Topical Lidocaine 4% cream applied to wound bed prior to debridement Skin Barriers/Peri-Wound Care Wound #1 Left,Lateral Lower Leg o Skin Prep Primary Wound Dressing Wound #1 Left,Lateral Lower Leg o Santyl Ointment Secondary Dressing Wound #1 Left,Lateral Lower Leg o Dry Gauze o Boardered Foam Dressing Dressing Change Frequency Wound #1 Left,Lateral Lower Leg o Change dressing every day. Follow-up Appointments Wound #1 Left,Lateral Lower Leg o Return Appointment in 1 week. Edema Control Wound #1 Left,Lateral Lower Leg o Patient to wear own compression stockings [name], [name] ([id number]) Home Health Wound #1 [place] Visits [place] [facility] to visit patient 2 times weekly, family to change dressing all  other days o Home Health Nurse may visit PRN to address patientos wound care needs. o FACE TO FACE ENCOUNTER: MEDICARE and MEDICAID PATIENTS: I certify that this patient is under my care and that I had a face-to-face encounter that meets the physician face-to-face encounter requirements with this patient on this date. The encounter with the patient was in whole or in part for the following MEDICAL CONDITION: (primary reason for [place]) MEDICAL NECESSITY: I certify, that based on my findings, NURSING services are a medically necessary home health service. HOME BOUND STATUS: I certify that my clinical findings support that this patient is homebound (i.e., Due to illness or injury, pt requires aid of supportive devices such as crutches, cane, wheelchairs, walkers, the use of special transportation or the assistance of another person to leave their place of residence. There is a normal  inability to leave the home and doing so requires considerable and taxing effort. Other absences are for medical reasons / religious services and are infrequent or of short duration when for other reasons). o If current dressing causes regression in wound condition, may D/C ordered dressing product/s and apply Normal Saline Moist Dressing daily until next [place] / Other MD appointment. [place] of regression in wound condition at [phone]. o Please direct any NON-WOUND related issues/requests for orders to patient's Primary Care Physician Medications-please add to medication list. Wound #1 Left,Lateral Lower Leg o Santyl Enzymatic Ointment Electronic Signature(s) Signed: 02/22/2016 4:21:25 PM By: Londell Moh FNP Signed: 02/22/2016 5:08:51 PM By: Montey Hora Entered By: Montey Hora on 02/22/2016 13:29:24 [name] ([id number]) -------------------------------------------------------------------------------- Problem List Details Patient  Name: CAMBRYN, CHARTERS 02/22/2016 12:45 Date of Service: PM Medical Record [id number] Number: Patient Account Number: [id number] Mar 01, 1941 (75 y.o. Treating RN: Cornell Barman Date of Birth/Sex: Female) Other Clinician: Primary Care Physician: Glendale Chard Treating Londell Moh Referring Physician: Glendale Chard Physician/Extender: Suella Grove in Treatment: 3 Active Problems ICD-10 Encounter Code Description Active Date Diagnosis E11.622 Type 2 diabetes mellitus with other skin ulcer 02/01/2016 Yes L97.222 Non-pressure chronic ulcer of left calf with fat layer 02/01/2016 Yes exposed I89.0 Lymphedema, not elsewhere classified 02/01/2016 Yes Inactive Problems Resolved Problems Electronic Signature(s) Signed: 02/22/2016 4:21:25 PM By: Londell Moh FNP Entered By: Londell Moh on 02/22/2016 13:54:00 [name] ([id number]) -------------------------------------------------------------------------------- Progress Note Details Patient Name: [name] 02/22/2016 12:45 Date of Service: PM Medical Record [id number] Number: Patient Account Number: [id number] [birth year] (75 y.o. Treating RN: Cornell Barman Date of Birth/Sex: Female) Other Clinician: Primary Care Physician: Glendale Chard Treating Londell Moh Referring Physician: Glendale Chard Physician/Extender: Suella Grove in Treatment: 3 Subjective Chief Complaint Information obtained from Patient Patients presents for treatment of an open diabetic ulcer the left lower extremity which she's had for about 4 weeks History of Present Illness (HPI) The following HPI elements were documented for the patient's wound: Location: left lower extremity lateral calf Quality: Patient reports experiencing a dull pain to affected area(s). Severity: Patient states wound are getting worse. Duration: Patient has had the wound for < 4 weeks prior to presenting for treatment Timing: Pain in wound is Intermittent (comes and  goes Context: The wound occurred when the patient was injured with a sharp corner for car door Modifying Factors: Other treatment(s) tried include:local care and 3 courses of antibiotics including Keflex, Bactrim and doxycycline Associated Signs and Symptoms: Patient reports having increase swelling. 75 year old patient with a past medical history of diabetes mellitus, chronic kidney disease, COPD, hypertension, rheumatoid arthritis had a lacerated wound to her left calf when she injured it on the corner of a car door and it was a long 12 cm laceration. x-ray done at the time revealed that there was no fracture or radiopaque foreign body. the wound was sutured with 4-0 Prolene simple interrupted sutures and a total of 14 sutures were applied. on August 31 she was seen in the ER and it was noted that the wound had dehisced and she was treated with Keflex. She has completed a course of doxycycline recently Medical history also significant for status post abdominal hysterectomy, brain surgery, cardiac catheterization, EGD and bronchoscopy. of late she was back in the ER and was seen last on August 31 and they tried Silvadene 02/22/16: pt reports discomfort and mild pain just below her wound. however, she admits on legs more than usual. no redness, foul  odor or purulence. the wound is measuring slightly larger today, but likely secondary to poor edema control. nursing staff gave information to pt today regarding purchase of her compression stockings. she was encouraged to purchase soon. [name], [name] ([id number]) Objective Constitutional morbidly obese. NAD. Vitals Time Taken: 1:03 PM, Temperature: 98.2 F, Pulse: 87 bpm, Respiratory Rate: 18 breaths/min, Blood Pressure: 127/78 mmHg. Ears, Nose, Mouth, and Throat Patient can hear normal speaking tones without difficulty.[name] Respiratory Respiratory effort is easy and symmetric bilaterally. Rate is normal at rest and on room  air.. Cardiovascular increased edema in the left lower leg.[name] Psychiatric good insight, poor judgment recently. Alert and oriented times 3.. Short and long term memory intact.. No evidence of depression, anxiety, or agitation. Calm, cooperative, and communicative. Appropriate interactions and affect.. Integumentary (Hair, Skin) Wound #1 status is Open. Original cause of wound was Trauma. The wound is located on the Left,Lateral Lower Leg. The wound measures 5.3cm length x 4.4cm width x 0.3cm depth; 18.315cm^2 area and 5.495cm^3 volume. The wound is limited to skin breakdown. There is no tunneling or undermining noted. There is a medium amount of sanguinous drainage noted. The wound margin is distinct with the outline attached to the wound base. There is small (1-33%) red granulation within the wound bed. There is a large (67-100%) amount of necrotic tissue within the wound bed including Eschar and Adherent Slough. The periwound skin appearance exhibited: Induration, Localized Edema, Scarring, Ecchymosis, Hemosiderin Staining, Erythema. The periwound skin appearance did not exhibit: Callus, Crepitus, Excoriation, Fluctuance, Friable, Rash, Dry/Scaly, Maceration, Moist, Atrophie Blanche, Cyanosis, Mottled, Pallor, Rubor. The surrounding wound skin color is noted with erythema. Periwound temperature was noted as No Abnormality. The periwound has tenderness on palpation. Assessment Active Problems ICD-10 E11.622 - Type 2 diabetes mellitus with other skin ulcer L97.222 - Non-pressure chronic ulcer of left calf with fat layer exposed I89.0 - Lymphedema, not elsewhere classified [name], [name] ([id number]) Diagnoses ICD-10 E11.622: Type 2 diabetes mellitus with other skin ulcer L97.222: Non-pressure chronic ulcer of left calf with fat layer exposed I89.0: Lymphedema, not elsewhere classified Procedures Wound #1 Wound #1 is a Diabetic Wound/Ulcer of the Lower Extremity located on the  Left,Lateral Lower Leg . There was a Skin/Subcutaneous Tissue Debridement ([id number]) debridement with total area of 23.32 sq cm performed by Londell Moh, NP. with the following instrument(s): Curette to remove Viable and Non- Viable tissue/material including Fibrin/Slough, Eschar, and Subcutaneous after achieving pain control using Lidocaine 4% Topical Solution. A time out was conducted at 13:24, prior to the start of the procedure. A Minimum amount of bleeding was controlled with Pressure. The procedure was tolerated well with a pain level of 0 throughout and a pain level of 0 following the procedure. Post Debridement Measurements: 5.3cm length x 4.4cm width x 0.5cm depth; 9.158cm^3 volume. Character of Wound/Ulcer Post Debridement is improved. Severity of Tissue Post Debridement is: Fat layer exposed. Post procedure Diagnosis Wound #1: Same as Pre-Procedure Plan Wound Cleansing: Wound #1 Left,Lateral Lower Leg: Clean wound with Normal Saline. May Shower, gently pat wound dry prior to applying new dressing. Anesthetic: Wound #1 Left,Lateral Lower Leg: Topical Lidocaine 4% cream applied to wound bed prior to debridement Skin Barriers/Peri-Wound Care: Wound #1 Left,Lateral Lower Leg: Skin Prep Primary Wound Dressing: Wound #1 Left,Lateral Lower Leg: Santyl Ointment Secondary Dressing: Wound #1 Left,Lateral Lower Leg: Dry Gauze Mcdaniel, [name] ([id number]) Boardered Foam Dressing Dressing Change Frequency: Wound #1 Left,Lateral Lower Leg: Change dressing every day. Follow-up Appointments: Wound #1 Left,Lateral  Lower Leg: Return Appointment in 1 week. Edema Control: Wound #1 Left,Lateral Lower Leg: Patient to wear own compression stockings Home Health: Wound #1 Left,Lateral Lower Leg: Continue Home Health Visits - [place] [facility] to visit patient 2 times weekly, family to change dressing all other days Home Health Nurse may visit PRN to address patient s wound care  needs. FACE TO FACE ENCOUNTER: MEDICARE and MEDICAID PATIENTS: I certify that this patient is under my care and that I had a face-to-face encounter that meets the physician face-to-face encounter requirements with this patient on this date. The encounter with the patient was in whole or in part for the following MEDICAL CONDITION: (primary reason for [place]) MEDICAL NECESSITY: I certify, that based on my findings, NURSING services are a medically necessary home health service. HOME BOUND STATUS: I certify that my clinical findings support that this patient is homebound (i.e., Due to illness or injury, pt requires aid of supportive devices such as crutches, cane, wheelchairs, walkers, the use of special transportation or the assistance of another person to leave their place of residence. There is a normal inability to leave the home and doing so requires considerable and taxing effort. Other absences are for medical reasons / religious services and are infrequent or of short duration when for other reasons). If current dressing causes regression in wound condition, may D/C ordered dressing product/s and apply Normal Saline Moist Dressing daily until next [place] / Other MD appointment. [place] of regression in wound condition at [phone]. Please direct any NON-WOUND related issues/requests for orders to patient's Primary Care Physician Medications-please add to medication list.: Wound #1 Left,Lateral Lower Leg: Santyl Enzymatic Ointment Follow-Up Appointments: A follow-up appointment should be scheduled. A Patient Clinical Summary of Care was provided to dg 1. discussed clinical findings with pt and daughter today. all questions were answered. 2. discussed and advised pt regarding good edema control. verbalized understanding and wishes to proceed with adherence. 3. Level 2 debridement performed today. see orders and procedure notes above. [name], [name] ([id number]) Electronic Signature(s) Signed: 03/12/2016 3:43:17 PM By: Londell Moh FNP Previous Signature: 02/22/2016 4:21:25 PM Version By: Londell Moh FNP Entered By: Londell Moh on 03/12/2016 15:43:17 [place] Mcdaniel, [name] ([id number]) -------------------------------------------------------------------------------- SuperBill Details Patient Name: [name]. Date of Service: 02/22/2016 Medical Record Number: [id number] Patient Account Number: [id number] Date of Birth/Sex: [birth year] (75 y.o. Female) Treating RN: Cornell Barman Primary Care Physician: Glendale Chard Other Clinician: Referring Physician: Glendale Chard Treating Physician/Extender: Loistine Chance in Treatment: 3 Diagnosis Coding ICD-10 Codes Code Description E11.622 Type 2 diabetes mellitus with other skin ulcer L97.222 Non-pressure chronic ulcer of left calf with fat layer exposed I89.0 Lymphedema, not elsewhere classified Facility Procedures CPT4 Code: 13244010 Description: 11042 - DEB SUBQ TISSUE 20 SQ CM/< ICD-10 Description Diagnosis E11.622 Type 2 diabetes mellitus with other skin ulcer L97.222 Non-pressure chronic ulcer of left calf with fat l Modifier: ayer exposed Quantity: 1 CPT4 Code: [id number] Description: 11045 - DEB SUBQ TISS EA ADDL 20CM ICD-10 Description Diagnosis E11.622 Type 2 diabetes mellitus with other skin ulcer L97.222 Non-pressure chronic ulcer of left calf with fat l Modifier: ayer exposed Quantity: 1 Physician Procedures CPT4 Code: [id number] Description: 11042 - WC PHYS SUBQ TISS 20 SQ CM ICD-10 Description Diagnosis E11.622 Type 2 diabetes mellitus with other skin ulcer L97.222 Non-pressure chronic ulcer of left calf with fat la Modifier: yer exposed Quantity: 1 CPT4 Code: [id number] Mcdaniel, Joan Description: 11045 - WC PHYS  SUBQ TISS EA ADDL 20 CM ICD-10 Description Diagnosis E11.622 Type 2 diabetes mellitus with other skin ulcer L97.222 Non-pressure chronic  ulcer of left calf with fat la RIS E. ([id number]) Modifier: yer exposed Quantity: 1 Electronic Signature(s) Signed: 02/22/2016 4:21:25 PM By: Londell Moh FNP Entered By: Londell Moh on 02/22/2016 13:57:44

## 2016-02-23 NOTE — Progress Notes (Signed)
LILIANNE, DELAIR ([id number]) Visit Report for 02/22/2016 Arrival Information Details Patient Name: SONAL, DORWART. Date of Service: 02/22/2016 12:45 PM Medical Record Number: [id number] Patient Account Number: [id number] Date of Birth/Sex: Oct 01, 1940 (75 y.o. Female) Treating RN: Montey Hora Primary Care Physician: Glendale Chard Other Clinician: Referring Physician: Glendale Chard Treating Physician/Extender: Loistine Chance in Treatment: 3 Visit Information History Since Last Visit Added or deleted any medications: No Patient Arrived: Walker Any new allergies or adverse reactions: No Arrival Time: 12:55 Had a fall or experienced change in No Accompanied By: dtr activities of daily living that may affect Transfer Assistance: None risk of falls: Patient Identification Verified: Yes Signs or symptoms of abuse/neglect since last No Secondary Verification Process Completed: Yes visito Patient Has Alerts: Yes Hospitalized since last visit: No Patient Alerts: DM II Pain Present Now: No Electronic Signature(s) Signed: 02/22/2016 5:08:51 PM By: Montey Hora Entered By: Montey Hora on 02/22/2016 12:56:03 [name] ([id number]) -------------------------------------------------------------------------------- Encounter Discharge Information Details Patient Name: [name]. Date of Service: 02/22/2016 12:45 PM Medical Record Number: [id number] Patient Account Number: [id number] Date of Birth/Sex: 1940/11/04 (75 y.o. Female) Treating RN: Montey Hora Primary Care Physician: Glendale Chard Other Clinician: Referring Physician: Glendale Chard Treating Physician/Extender: Loistine Chance in Treatment: 3 Encounter Discharge Information Items Discharge Pain Level: 0 Discharge Condition: Stable Ambulatory Status: Ambulatory Discharge Destination: Home Transportation: Private Auto Accompanied By: dtr Schedule Follow-up Appointment:  Yes Medication Reconciliation completed and provided to Patient/Care No [employer]: Provided on Clinical Summary of Care: 02/22/2016 Form Type Recipient Paper Patient dg Electronic Signature(s) Signed: 02/22/2016 1:40:02 PM By: Lorine Bears RCP, RRT, CHT Entered By: Lorine Bears on 02/22/2016 13:40:02 [name] ([id number]) -------------------------------------------------------------------------------- Lower Extremity Assessment Details Patient Name: [name]. Date of Service: 02/22/2016 12:45 PM Medical Record Number: [id number] Patient Account Number: [id number] Date of Birth/Sex: Jun 27, 1940 (75 y.o. Female) Treating RN: Montey Hora Primary Care Physician: Glendale Chard Other Clinician: Referring Physician: Glendale Chard Treating Physician/Extender: Loistine Chance in Treatment: 3 Edema Assessment Assessed: [Left: No] [Right: No] Edema: [Left: Ye] [Right: s] Calf Left: Right: Point of Measurement: 34 cm From Medial Instep 47 cm cm Ankle Left: Right: Point of Measurement: 11 cm From Medial Instep 32.3 cm cm Vascular Assessment Pulses: Posterior Tibial Dorsalis Pedis Palpable: [Left:Yes] Extremity colors, hair growth, and conditions: Extremity Color: [Left:Hyperpigmented] Hair Growth on Extremity: [Left:No] Temperature of Extremity: [Left:Warm] Electronic Signature(s) Signed: 02/22/2016 5:08:51 PM By: Montey Hora Entered By: Montey Hora on 02/22/2016 13:25:26 [name] ([id number]) -------------------------------------------------------------------------------- Multi Wound Chart Details Patient Name: [name]. Date of Service: 02/22/2016 12:45 PM Medical Record Number: [id number] Patient Account Number: [id number] Date of Birth/Sex: [birth year] (75 y.o. 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 ([id number]) Wound Margin: Distinct, outline attached N/A N/A Granulation Amount: Small (1-33%) N/A N/A Granulation Quality: Red, Hyper-granulation N/A N/A Necrotic Amount: Large (67-100%) N/A N/A Necrotic Tissue: Eschar, Adherent Slough N/A N/A Exposed Structures: Fascia: No N/A N/A Fat: No Tendon: No Muscle: No Joint: No Bone: No Limited to Skin Breakdown Epithelialization: Small (1-33%) N/A N/A Periwound Skin Texture: Edema: Yes N/A N/A Induration: Yes Scarring: Yes Excoriation: No Callus: No Crepitus: No Fluctuance: No Friable: No Rash: No Periwound Skin Maceration: No N/A N/A Moisture: Moist: No Dry/Scaly: No Periwound Skin Color: Ecchymosis: Yes N/A N/A Erythema: Yes Hemosiderin Staining: Yes Atrophie Blanche: No Cyanosis: No Mottled: No Pallor: No Rubor: No Temperature: No Abnormality N/A N/A Tenderness on Yes N/A N/A Palpation: Wound Preparation: Ulcer  Cleansing: N/A N/A Rinsed/Irrigated with Saline Topical Anesthetic Applied: Other: lidocaine 4% Treatment Notes Electronic Signature(s) Signed: 02/22/2016 5:08:51 PM By: Melody Haver, Edwena Felty ([id number]) Entered By: Montey Hora on 02/22/2016 13:27:20 [name] ([id number]) -------------------------------------------------------------------------------- [place] Details Patient Name: EMAGENE, MERFELD. Date of Service: 02/22/2016 12:45 PM Medical Record Number: [id number] Patient Account Number: [id number] Date of Birth/Sex: 06/01/1940 (75 y.o. Female) Treating RN: Montey Hora Primary Care Physician: Glendale Chard Other Clinician: Referring Physician: Glendale Chard Treating Physician/Extender: Loistine Chance in Treatment: 3 Active Inactive Abuse / Safety / Falls / Self Care Management Nursing Diagnoses: Impaired physical mobility Potential for falls Goals: Patient will remain injury free Date Initiated: 02/01/2016 Goal Status: Active Interventions: Assess fall risk on admission and as needed Notes: Orientation to the Wound Care Program Nursing Diagnoses: Knowledge deficit related to the wound healing center program Goals: Patient/caregiver will verbalize understanding of the [facility] Program Date Initiated: 02/01/2016 Goal Status: Active Interventions: Provide education on orientation to the wound center Notes: Wound/Skin Impairment Nursing Diagnoses: Impaired tissue integrity Goals: Patient/caregiver will verbalize understanding of skin care regimen RAEGEN, TARPLEY ([id number]) Date Initiated: 02/01/2016 Goal Status: Active Ulcer/skin breakdown will have a volume reduction of 30% by week 4 Date Initiated: 02/01/2016 Goal Status: Active Ulcer/skin breakdown will have a volume reduction of 50% by week 8 Date Initiated: 02/01/2016 Goal Status: Active Ulcer/skin breakdown will have a volume  reduction of 80% by week 12 Date Initiated: 02/01/2016 Goal Status: Active Ulcer/skin breakdown will heal within 14 weeks Date Initiated: 02/01/2016 Goal Status: Active Interventions: Assess patient/caregiver ability to obtain necessary supplies Assess patient/caregiver ability to perform ulcer/skin care regimen upon admission and as needed Assess ulceration(s) every visit Notes: Electronic Signature(s) Signed: 02/22/2016 5:08:51 PM By: Montey Hora Entered By: Montey Hora on 02/22/2016 13:26:02 [name] ([id number]) -------------------------------------------------------------------------------- Pain Assessment Details Patient Name: [name]. Date of Service: 02/22/2016 12:45 PM Medical Record Number: [id number] Patient Account Number: [id number] Date of Birth/Sex: [birth year] (75 y.o. Female) Treating RN: Montey Hora Primary Care Physician: Glendale Chard Other Clinician: Referring Physician: Glendale Chard Treating Physician/Extender: Loistine Chance in Treatment: 3 Active Problems Location of Pain Severity and Description of Pain Patient Has Paino Yes Site Locations Pain Location: Pain in Ulcers With Dressing Change: Yes Duration of the Pain. Constant / Intermittento Constant Pain Management and Medication Current Pain Management: Notes Topical or injectable lidocaine is offered to patient for acute pain when surgical debridement is performed. If needed, Patient is instructed to use over the counter pain medication for the  following 24-48 hours after debridement. Wound care MDs do not prescribed pain medications. Patient has chronic pain or uncontrolled pain. Patient has been instructed to make an appointment with their Primary Care Physician for pain management. Electronic Signature(s) Signed: 02/22/2016 5:08:51 PM By: Montey Hora Entered By: Montey Hora on 02/22/2016 12:56:34 [name]  ([id number]) -------------------------------------------------------------------------------- Patient/Caregiver Education Details Patient Name: [name] Date of Service: 02/22/2016 12:45 PM Medical Record Number: [id number] Patient Account Number: [id number] Date of Birth/Gender: 1941/01/28 (75 y.o. Female) Treating RN: Montey Hora Primary Care Physician: Glendale Chard Other Clinician: Referring Physician: Glendale Chard Treating Physician/Extender: Loistine Chance in Treatment: 3 Education Assessment Education Provided To: Patient Education Topics Provided Venous: Handouts: Other: leg elevation Methods: Explain/Verbal Responses: State content correctly Wound/Skin Impairment: Handouts: Other: wound care as ordered Methods: Demonstration, Explain/Verbal Responses: State content correctly Electronic Signature(s) Signed: 02/22/2016 5:08:51 PM By: Montey Hora Entered By: Montey Hora on 02/22/2016 13:31:45 [name] ([id number]) -------------------------------------------------------------------------------- Wound Assessment Details Patient Name: [name]. Date of Service: 02/22/2016 12:45 PM Medical Record Number: [id number] Patient Account Number: [id number] Date of Birth/Sex: 03-15-1941 (76 y.o. Female) Treating RN: Montey Hora Primary Care Physician: Glendale Chard Other Clinician: Referring Physician: Glendale Chard Treating Physician/Extender: Loistine Chance in Treatment: 3 Wound Status Wound Number: 1 Primary Diabetic Wound/Ulcer of the Lower Etiology: Extremity Wound Location: Left Lower Leg - Lateral Secondary Auto-immune Wounding Event: Trauma Etiology: Date Acquired: 12/27/2015 Wound Open Weeks Of Treatment: 3 Status: Clustered Wound: No Comorbid Cataracts, Lymphedema, History: Hypertension, Type II Diabetes, Gout, Rheumatoid Arthritis, Osteoarthritis, Neuropathy Photos Wound Measurements Length: (cm)  5.3 Width: (cm) 4.4 Depth: (cm) 0.3 Area: (cm) 18.315 Volume: (cm) 5.495 % Reduction in Area: 7.5% % Reduction in Volume: -38.8% Epithelialization: Small (1-33%) Tunneling: No Undermining: No Wound Description Classification: Grade 2 Wound Margin: Distinct, outline attached Exudate Amount: Medium Exudate Type: Sanguinous Exudate Color: red Wound Bed Granulation Amount: Small (1-33%) Exposed Structure Granulation Quality: Red, Hyper-granulation Fascia Exposed: No Zettlemoyer, Avalin E. ([id number]) Necrotic Amount: Large (67-100%) Fat Layer Exposed: No Necrotic Quality: Eschar, Adherent Slough Tendon Exposed: No Muscle Exposed: No Joint Exposed: No Bone Exposed: No Limited to Skin Breakdown Periwound Skin Texture Texture Color No Abnormalities Noted: No No Abnormalities Noted: No Callus: No Atrophie Blanche: No Crepitus: No Cyanosis: No Excoriation: No Ecchymosis: Yes Fluctuance: No Erythema: Yes Friable: No Hemosiderin Staining: Yes Induration: Yes Mottled: No Localized Edema: Yes Pallor: No Rash: No Rubor: No Scarring: Yes Temperature / Pain Moisture Temperature: No Abnormality No Abnormalities Noted: No Tenderness on Palpation: Yes Dry / Scaly: No Maceration: No Moist: No Wound Preparation Ulcer Cleansing: Rinsed/Irrigated with Saline Topical Anesthetic Applied: Other: lidocaine 4%, Treatment Notes Wound #1 (Left, Lateral Lower Leg) 1. Cleansed with: Clean wound with Normal Saline 2. Anesthetic Topical Lidocaine 4% cream to wound bed prior to debridement 4. Dressing Applied: Santyl Ointment 5. Secondary Dressing Applied Bordered Foam Dressing Dry Gauze 7. Secured with Patient to wear own compression stockings Electronic Signature(s) Signed: 02/22/2016 5:08:51 PM By: Montey Hora Entered By: Montey Hora on 02/22/2016 13:27:00 [name] ([id number]) [name], Edwena Felty  ([id number]) -------------------------------------------------------------------------------- Vitals Details Patient Name: [name]. Date of Service: 02/22/2016 12:45 PM Medical Record Number: [id number] Patient Account Number: [id number] Date of Birth/Sex: 01-Dec-1940 (75 y.o. Female) Treating RN: Montey Hora Primary Care Physician: Glendale Chard Other Clinician: Referring Physician: Glendale Chard Treating Physician/Extender: Loistine Chance in Treatment: 3 Vital Signs Time Taken: 13:03 Temperature (F): 98.2 Pulse (bpm): 87 Respiratory Rate (  breaths/min): 18 Blood Pressure (mmHg): 127/78 Reference Range: 80 - 120 mg / dl Electronic Signature(s) Signed: 02/22/2016 5:08:51 PM By: Montey Hora Entered By: Montey Hora on 02/22/2016 13:05:47

## 2016-02-25 DIAGNOSIS — E1140 Type 2 diabetes mellitus with diabetic neuropathy, unspecified: Secondary | ICD-10-CM | POA: Diagnosis not present

## 2016-02-25 DIAGNOSIS — S81812D Laceration without foreign body, left lower leg, subsequent encounter: Secondary | ICD-10-CM | POA: Diagnosis not present

## 2016-02-25 DIAGNOSIS — T798XXD Other early complications of trauma, subsequent encounter: Secondary | ICD-10-CM | POA: Diagnosis not present

## 2016-02-25 DIAGNOSIS — I5032 Chronic diastolic (congestive) heart failure: Secondary | ICD-10-CM | POA: Diagnosis not present

## 2016-02-25 DIAGNOSIS — I130 Hypertensive heart and chronic kidney disease with heart failure and stage 1 through stage 4 chronic kidney disease, or unspecified chronic kidney disease: Secondary | ICD-10-CM | POA: Diagnosis not present

## 2016-02-25 DIAGNOSIS — E1129 Type 2 diabetes mellitus with other diabetic kidney complication: Secondary | ICD-10-CM | POA: Diagnosis not present

## 2016-02-28 DIAGNOSIS — S81812D Laceration without foreign body, left lower leg, subsequent encounter: Secondary | ICD-10-CM | POA: Diagnosis not present

## 2016-02-28 DIAGNOSIS — T798XXD Other early complications of trauma, subsequent encounter: Secondary | ICD-10-CM | POA: Diagnosis not present

## 2016-02-28 DIAGNOSIS — E1140 Type 2 diabetes mellitus with diabetic neuropathy, unspecified: Secondary | ICD-10-CM | POA: Diagnosis not present

## 2016-02-28 DIAGNOSIS — I5032 Chronic diastolic (congestive) heart failure: Secondary | ICD-10-CM | POA: Diagnosis not present

## 2016-02-28 DIAGNOSIS — E1129 Type 2 diabetes mellitus with other diabetic kidney complication: Secondary | ICD-10-CM | POA: Diagnosis not present

## 2016-02-28 DIAGNOSIS — I130 Hypertensive heart and chronic kidney disease with heart failure and stage 1 through stage 4 chronic kidney disease, or unspecified chronic kidney disease: Secondary | ICD-10-CM | POA: Diagnosis not present

## 2016-02-29 ENCOUNTER — Encounter: Payer: Medicare Other | Admitting: Nurse Practitioner

## 2016-02-29 DIAGNOSIS — E1122 Type 2 diabetes mellitus with diabetic chronic kidney disease: Secondary | ICD-10-CM | POA: Diagnosis not present

## 2016-02-29 DIAGNOSIS — L97222 Non-pressure chronic ulcer of left calf with fat layer exposed: Secondary | ICD-10-CM | POA: Diagnosis not present

## 2016-02-29 DIAGNOSIS — N189 Chronic kidney disease, unspecified: Secondary | ICD-10-CM | POA: Diagnosis not present

## 2016-02-29 DIAGNOSIS — I890 Lymphedema, not elsewhere classified: Secondary | ICD-10-CM | POA: Diagnosis not present

## 2016-02-29 DIAGNOSIS — E11622 Type 2 diabetes mellitus with other skin ulcer: Secondary | ICD-10-CM | POA: Diagnosis not present

## 2016-02-29 DIAGNOSIS — J449 Chronic obstructive pulmonary disease, unspecified: Secondary | ICD-10-CM | POA: Diagnosis not present

## 2016-03-01 NOTE — Progress Notes (Signed)
NEKA, BISE ([id number]) Visit Report for 02/29/2016 Arrival Information Details Patient Name: [name], [name]. Date of Service: 02/29/2016 12:45 PM Medical Record Number: [id number] Patient Account Number: [id number] Date of Birth/Sex: Jan 12, 1941 (75 y.o. Female) Treating RN: Montey Hora Primary Care Physician: Glendale Chard Other Clinician: Referring Physician: Glendale Chard Treating Physician/Extender: Loistine Chance in Treatment: 4 Visit Information History Since Last Visit Added or deleted any medications: No Patient Arrived: Walker Any new allergies or adverse reactions: No Arrival Time: 12:56 Had a fall or experienced change in No Accompanied By: self activities of daily living that may affect Transfer Assistance: None risk of falls: Patient Identification Verified: Yes Signs or symptoms of abuse/neglect since last No Secondary Verification Process Completed: Yes visito Patient Has Alerts: Yes Hospitalized since last visit: No Patient Alerts: DM II Pain Present Now: No Electronic Signature(s) Signed: 02/29/2016 5:50:35 PM By: Montey Hora Entered By: Montey Hora on 02/29/2016 12:58:58 [name] ([id number]) -------------------------------------------------------------------------------- Encounter Discharge Information Details Patient Name: [name]. Date of Service: 02/29/2016 12:45 PM Medical Record Number: [id number] Patient Account Number: [id number] Date of Birth/Sex: 1940-06-11 (75 y.o. Female) Treating RN: Montey Hora Primary Care Physician: Glendale Chard Other Clinician: Referring Physician: Glendale Chard Treating Physician/Extender: Loistine Chance in Treatment: 4 Encounter Discharge Information Items Discharge Pain Level: 0 Discharge Condition: Stable Ambulatory Status: Walker Discharge Destination: Home Transportation: Private Auto Accompanied By: self Schedule Follow-up Appointment:  Yes Medication Reconciliation completed and provided to Patient/Care No Orry Sigl: Provided on Clinical Summary of Care: 02/29/2016 Form Type Recipient Paper Patient DG Electronic Signature(s) Signed: 02/29/2016 1:48:15 PM By: Ruthine Dose Entered By: Ruthine Dose on 02/29/2016 13:48:15 [name], Afia E. ([id number]) -------------------------------------------------------------------------------- Lower Extremity Assessment Details Patient Name: [name]. Date of Service: 02/29/2016 12:45 PM Medical Record Number: [id number] Patient Account Number: [id number] Date of Birth/Sex: Feb 22, 1941 (75 y.o. Female) Treating RN: Montey Hora Primary Care Physician: Glendale Chard Other Clinician: Referring Physician: Glendale Chard Treating Physician/Extender: Loistine Chance in Treatment: 4 Edema Assessment Assessed: [Left: No] [Right: No] E[Left: dema] [Right: :] Calf Left: Right: Point of Measurement: 34 cm From Medial Instep 47.5 cm cm Ankle Left: Right: Point of Measurement: 11 cm From Medial Instep 33.3 cm cm Vascular Assessment Pulses: Posterior Tibial Dorsalis Pedis Palpable: [Left:Yes] Extremity colors, hair growth, and conditions: Extremity Color: [Left:Hyperpigmented] Hair Growth on Extremity: [Left:No] Temperature of Extremity: [Left:Warm] Capillary Refill: [Left:< 3 seconds] Electronic Signature(s) Signed: 02/29/2016 5:50:35 PM By: Montey Hora Entered By: Montey Hora on 02/29/2016 13:31:11 [name] ([id number]) -------------------------------------------------------------------------------- Multi Wound Chart Details Patient Name: [name]. Date of Service: 02/29/2016 12:45 PM Medical Record Number: [id number] Patient Account Number: [id number] Date of Birth/Sex: 1940-11-05 (75 y.o. 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 ([id number]) Wound Margin: Distinct, outline attached N/A N/A Granulation Amount: Medium (34-66%) N/A N/A Granulation Quality: Red, Hyper-granulation N/A N/A Necrotic Amount: Medium (34-66%) N/A N/A Necrotic Tissue: Eschar, Adherent Slough N/A N/A Exposed Structures: Fascia: No N/A N/A Fat: No Tendon: No Muscle: No Joint: No Bone: No Limited to Skin Breakdown Epithelialization: Small (1-33%) N/A N/A Periwound Skin Texture: Edema: Yes N/A N/A Induration: Yes Scarring: Yes Excoriation: No Callus: No Crepitus: No Fluctuance: No Friable: No Rash: No Periwound Skin Moist: Yes N/A N/A Moisture: Maceration: No Dry/Scaly: No Periwound Skin Color: Ecchymosis: Yes N/A N/A Erythema: Yes Hemosiderin Staining: Yes Atrophie Blanche: No Cyanosis: No Mottled: No Pallor: No Rubor: No Temperature: No Abnormality N/A N/A Tenderness on Yes N/A N/A Palpation: Wound Preparation: Ulcer Cleansing: N/A  N/A Rinsed/Irrigated with Saline Topical Anesthetic Applied: Other: lidocaine 4% Treatment Notes Electronic Signature(s) Signed: 02/29/2016 5:50:35 PM By: Melody Haver, Edwena Felty ([id number]) Entered By: Montey Hora on 02/29/2016 13:31:25 [name] ([id number]) -------------------------------------------------------------------------------- [place] Details Patient Name: [name], [name]. Date of Service: 02/29/2016 12:45 PM Medical Record Number: [id number] Patient Account Number: [id number] Date of Birth/Sex: Sep 03, 1940 (75 y.o. Female) Treating RN: Montey Hora Primary Care Physician: Glendale Chard Other Clinician: Referring Physician: Glendale Chard Treating Physician/Extender: Loistine Chance in Treatment: 4 Active Inactive Abuse / Safety / Falls / Self Care Management Nursing Diagnoses: Impaired physical mobility Potential for falls Goals: Patient will remain injury free Date Initiated: 02/01/2016 Goal Status: Active Interventions: Assess fall risk on admission and as needed Notes: Orientation to the Wound Care Program Nursing Diagnoses: Knowledge deficit related to the wound healing center program Goals: Patient/caregiver will verbalize understanding of the [facility] Program Date Initiated: 02/01/2016 Goal Status: Active Interventions: Provide education on orientation to the wound center Notes: Wound/Skin Impairment Nursing Diagnoses: Impaired tissue integrity Goals: Patient/caregiver will verbalize understanding of skin care regimen [name], [name] ([id number]) Date Initiated: 02/01/2016 Goal Status: Active Ulcer/skin breakdown will have a volume reduction of 30% by week 4 Date Initiated: 02/01/2016 Goal Status: Active Ulcer/skin breakdown will have a volume reduction of 50% by week 8 Date Initiated: 02/01/2016 Goal Status: Active Ulcer/skin breakdown will have a volume reduction of 80% by  week 12 Date Initiated: 02/01/2016 Goal Status: Active Ulcer/skin breakdown will heal within 14 weeks Date Initiated: 02/01/2016 Goal Status: Active Interventions: Assess patient/caregiver ability to obtain necessary supplies Assess patient/caregiver ability to perform ulcer/skin care regimen upon admission and as needed Assess ulceration(s) every visit Notes: Electronic Signature(s) Signed: 02/29/2016 5:50:35 PM By: Montey Hora Entered By: Montey Hora on 02/29/2016 13:31:17 [name] ([id number]) -------------------------------------------------------------------------------- Pain Assessment Details Patient Name: [name]. Date of Service: 02/29/2016 12:45 PM Medical Record Number: [id number] Patient Account Number: [id number] Date of Birth/Sex: [birth year] (75 y.o. Female) Treating RN: Montey Hora Primary Care Physician: Glendale Chard Other Clinician: Referring Physician: Glendale Chard Treating Physician/Extender: Loistine Chance in Treatment: 4 Active Problems Location of Pain Severity and Description of Pain Patient Has Paino No Site Locations Pain Management and Medication Current Pain Management: Notes Topical or injectable lidocaine is offered to patient for acute pain when surgical debridement is performed. If needed, Patient is instructed to use over the counter pain medication for the following 24-48 hours after debridement. Wound care MDs do not prescribed pain medications. Patient has chronic pain or  uncontrolled pain. Patient has been instructed to make an appointment with their Primary Care Physician for pain management. Electronic Signature(s) Signed: 02/29/2016 5:50:35 PM By: Montey Hora Entered By: Montey Hora on 02/29/2016 12:59:28 [name] ([id number]) -------------------------------------------------------------------------------- Patient/Caregiver Education Details Patient Name: [name] Date  of Service: 02/29/2016 12:45 PM Medical Record Number: [id number] Patient Account Number: [id number] Date of Birth/Gender: [birth year] (75 y.o. Female) Treating RN: Montey Hora Primary Care Physician: Glendale Chard Other Clinician: Referring Physician: Glendale Chard Treating Physician/Extender: Loistine Chance in Treatment: 4 Education Assessment Education Provided To: Patient Education Topics Provided Wound/Skin Impairment: Handouts: Other: wound care as ordered Methods: Demonstration, Explain/Verbal Responses: State content correctly Electronic Signature(s) Signed: 02/29/2016 5:50:35 PM By: Montey Hora Entered By: Montey Hora on 02/29/2016 13:47:16 Scollard, Neshia E. ([id number]) -------------------------------------------------------------------------------- Wound Assessment Details Patient Name: [name]. Date of Service: 02/29/2016 12:45 PM Medical Record Number: [id number] Patient Account Number: [id number] Date of Birth/Sex: 05-17-1941 (75 y.o. Female) Treating RN: Montey Hora Primary Care Physician: Glendale Chard Other Clinician: Referring Physician: Glendale Chard Treating Physician/Extender: Loistine Chance in Treatment: 4 Wound Status Wound Number: 1 Primary Diabetic Wound/Ulcer of the Lower Etiology: Extremity Wound Location: Left Lower Leg - Lateral Secondary Auto-immune Wounding Event: Trauma Etiology: Date Acquired: 12/27/2015 Wound Open Weeks Of Treatment: 4 Status: Clustered Wound: No Comorbid Cataracts, Lymphedema, History: Hypertension, Type II Diabetes, Gout, Rheumatoid Arthritis, Osteoarthritis, Neuropathy Photos Wound Measurements Length: (cm) 5 Width: (cm) 4.4 Depth: (cm) 0.3 Area: (cm) 17.279 Volume: (cm) 5.184 % Reduction in Area: 12.7% % Reduction in Volume: -31% Epithelialization: Small (1-33%) Tunneling: No Undermining: No Wound Description Classification: Grade 2 Wound Margin: Distinct,  outline attached Exudate Amount: Medium Exudate Type: Sanguinous Exudate Color: red Wound Bed Granulation Amount: Medium (34-66%) Exposed Structure Granulation Quality: Red, Hyper-granulation Fascia Exposed: No Hantz, Mathilde E. ([id number]) Necrotic Amount: Medium (34-66%) Fat Layer Exposed: No Necrotic Quality: Eschar, Adherent Slough Tendon Exposed: No Muscle Exposed: No Joint Exposed: No Bone Exposed: No Limited to Skin Breakdown Periwound Skin Texture Texture Color No Abnormalities Noted: No No Abnormalities Noted: No Callus: No Atrophie Blanche: No Crepitus: No Cyanosis: No Excoriation: No Ecchymosis: Yes Fluctuance: No Erythema: Yes Friable: No Hemosiderin Staining: Yes Induration: Yes Mottled: No Localized Edema: Yes Pallor: No Rash: No Rubor: No Scarring: Yes Temperature / Pain Moisture Temperature: No Abnormality No Abnormalities Noted: No Tenderness on Palpation: Yes Dry / Scaly: No Maceration: No Moist: Yes Wound Preparation Ulcer Cleansing: Rinsed/Irrigated with Saline Topical Anesthetic Applied: Other: lidocaine 4%, Treatment Notes Wound #1 (Left, Lateral Lower Leg) 1. Cleansed with: Clean wound with Normal Saline 2. Anesthetic Topical Lidocaine 4% cream to wound bed prior to debridement 3. Peri-wound Care: Barrier cream 4. Dressing Applied: Iodoflex 5. Secondary Dressing Applied Bordered Foam Dressing Dry Gauze 7. Secured with Patient to wear own compression stockings Electronic Signature(s) Signed: 02/29/2016 5:50:35 PM By: Melody Haver, Edwena Felty ([id number]) Entered By: Montey Hora on 02/29/2016 13:30:09 [name] ([id number]) -------------------------------------------------------------------------------- Vitals Details Patient Name: [name]. Date of Service: 02/29/2016 12:45 PM Medical Record Number: [id number] Patient Account Number: [id number] Date of Birth/Sex: 02/13/1941 (75 y.o.  Female) Treating RN: Montey Hora Primary Care Physician: Glendale Chard Other Clinician: Referring Physician: Glendale Chard Treating Physician/Extender: Loistine Chance in Treatment: 4 Vital Signs Time Taken: 13:00 Temperature (F): 98.4 Pulse (bpm): 93 Respiratory Rate (breaths/min): 18 Blood Pressure (mmHg): 115/68 Reference Range: 80 - 120 mg / dl Electronic Signature(s) Signed: 02/29/2016 5:50:35 PM By: Montey Hora Entered By:  Montey Hora on 02/29/2016 13:00:59

## 2016-03-01 NOTE — Progress Notes (Signed)
ZUHA, DEJONGE ([id number]) Visit Report for 02/29/2016 Chief Complaint Document Details Patient Name: [name], [name] 02/29/2016 12:45 Date of Service: PM Medical Record [id number] Number: Patient Account Number: [id number] 12-Oct-1940 (75 y.o. Treating RN: Montey Hora Date of Birth/Sex: Female) Other Clinician: Primary Care Physician: Glendale Chard Treating Londell Moh Referring Physician: Glendale Chard Physician/Extender: Suella Grove in Treatment: 4 Information Obtained from: Patient Chief Complaint Patients presents for treatment of an open diabetic ulcer the left lower extremity which she's had for about 4 weeks Electronic Signature(s) Signed: 02/29/2016 5:31:30 PM By: Londell Moh FNP Entered By: Londell Moh on 02/29/2016 13:39:04 [name] ([id number]) -------------------------------------------------------------------------------- Debridement Details Patient Name: [name] 02/29/2016 12:45 Date of Service: PM Medical Record [id number] Number: Patient Account Number: [id number] Feb 28, 1941 (75 y.o. Treating RN: Montey Hora Date of Birth/Sex: Female) Other Clinician: Primary Care Physician: Glendale Chard Treating Londell Moh Referring Physician: Glendale Chard Physician/Extender: Suella Grove in Treatment: 4 Debridement Performed for Wound #1 Left,Lateral Lower Leg Assessment: Performed By: Physician Londell Moh, NP Debridement: Debridement Pre-procedure Yes - 13:28 Verification/Time Out Taken: Start Time: 13:28 Pain Control: Lidocaine 4% Topical Solution Level: Skin/Subcutaneous Tissue Total Area Debrided (L x 5 (cm) x 4.4 (cm) = 22 (cm) W): Tissue and other Viable, Non-Viable, Eschar, Fibrin/Slough, Subcutaneous material debrided: Instrument: Curette Bleeding: Minimum Hemostasis Achieved: Silver Nitrate End Time: 13:31 Procedural Pain: 0 Post Procedural Pain: 0 Response to Treatment: Procedure was  tolerated well Post Debridement Measurements of Total Wound Length: (cm) 5 Width: (cm) 4.4 Depth: (cm) 0.5 Volume: (cm) 8.639 Character of Wound/Ulcer Post Improved Debridement: Severity of Tissue Post Debridement: Fat layer exposed Post Procedure Diagnosis Same as Pre-procedure Electronic Signature(s) Signed: 02/29/2016 5:31:30 PM By: Londell Moh FNP Signed: 02/29/2016 5:50:35 PM By: Durwin Nora ([id number]) Entered By: Montey Hora on 02/29/2016 13:32:30 [name] ([id number]) -------------------------------------------------------------------------------- HPI Details Patient Name: [name] 02/29/2016 12:45 Date of Service: PM Medical Record [id number] Number: Patient Account Number: [id number] Feb 19, 1941 (75 y.o. Treating RN: Montey Hora Date of Birth/Sex: Female) Other Clinician: Primary Care Physician: Glendale Chard Treating Londell Moh Referring Physician: Glendale Chard Physician/Extender: Suella Grove in Treatment: 4 History of Present Illness Location: left lower extremity lateral calf Quality: Patient reports experiencing a dull pain to affected area(s). Severity: Patient states wound are getting worse. Duration: Patient has had the wound for < 4 weeks prior to presenting for treatment Timing: Pain in wound is Intermittent (comes and goes Context: The wound occurred when the patient was injured with a sharp corner for car door Modifying Factors: Other treatment(s) tried include:local care and 3 courses of antibiotics including Keflex, Bactrim and doxycycline Associated Signs and Symptoms: Patient reports having increase swelling. HPI Description: 75 year old patient with a past medical history of diabetes mellitus, chronic kidney disease, COPD, hypertension, rheumatoid arthritis had a lacerated wound to her left calf when she injured it on the corner of a car door and it was a long 12 cm laceration. x-ray done  at the time revealed that there was no fracture or radiopaque foreign body. the wound was sutured with 4-0 Prolene simple interrupted sutures and a total of 14 sutures were applied. on August 31 she was seen in the ER and it was noted that the wound had dehisced and she was treated with Keflex. She has completed a course of doxycycline recently Medical history also significant for status post abdominal hysterectomy, brain surgery, cardiac catheterization, EGD and bronchoscopy. of late she was back in the ER and was seen  last on August 31 and they tried Silvadene 02/22/16: pt reports discomfort and mild pain just below her wound. however, she admits on legs more than usual. no redness, foul odor or purulence. the wound is measuring slightly larger today, but likely secondary to poor edema control. nursing staff gave information to pt today regarding purchase of her compression stockings. she was encouraged to purchase soon. 02/29/16: returns today for f/u. she denies systemic s/s of infection. she brought her new compression stockings today. Electronic Signature(s) Signed: 02/29/2016 5:31:30 PM By: Londell Moh FNP Entered By: Londell Moh on 02/29/2016 13:39:49 [name] ([id number]) -------------------------------------------------------------------------------- Physical Exam Details Patient Name: [name] 02/29/2016 12:45 Date of Service: PM Medical Record [id number] Number: Patient Account Number: [id number] Aug 28, 1940 (75 y.o. Treating RN: Montey Hora Date of Birth/Sex: Female) Other Clinician: Primary Care Physician: Glendale Chard Treating Londell Moh Referring Physician: Glendale Chard Physician/Extender: Suella Grove in Treatment: 4 Electronic Signature(s) Signed: 02/29/2016 5:31:30 PM By: Londell Moh FNP Entered By: Londell Moh on 02/29/2016 13:40:00 [name]  ([id number]) -------------------------------------------------------------------------------- Physician Orders Details Patient Name: [name] 02/29/2016 12:45 Date of Service: PM Medical Record [id number] Number: Patient Account Number: [id number] 1941/03/13 (75 y.o. Treating RN: Montey Hora Date of Birth/Sex: Female) Other Clinician: Primary Care Physician: Glendale Chard Treating Londell Moh Referring Physician: Glendale Chard Physician/Extender: Suella Grove in Treatment: 4 Verbal / Phone Orders: Yes Clinician: Montey Hora Read Back and Verified: Yes Diagnosis Coding Wound Cleansing Wound #1 Left,Lateral Lower Leg o Clean wound with Normal Saline. o May Shower, gently pat wound dry prior to applying new dressing. Anesthetic Wound #1 Left,Lateral Lower Leg o Topical Lidocaine 4% cream applied to wound bed prior to debridement Skin Barriers/Peri-Wound Care Wound #1 Left,Lateral Lower Leg o Skin Prep Primary Wound Dressing Wound #1 Left,Lateral Lower Leg o Iodoflex Secondary Dressing Wound #1 Left,Lateral Lower Leg o Dry Gauze o Boardered Foam Dressing Dressing Change Frequency Wound #1 Left,Lateral Lower Leg o Change dressing every day. Follow-up Appointments Wound #1 Left,Lateral Lower Leg o Return Appointment in 1 week. Edema Control Wound #1 Left,Lateral Lower Leg o Patient to wear own compression stockings ARIYANNAH, PAULING ([id number]) Home Health Wound #1 [place] Visits [place] [facility] to visit patient 2 times weekly, family to change dressing all other days o Home Health Nurse may visit PRN to address patientos wound care needs. o FACE TO FACE ENCOUNTER: MEDICARE and MEDICAID PATIENTS: I certify that this patient is under my care and that I had a face-to-face encounter that meets the physician face-to-face encounter requirements with this patient on this date. The encounter  with the patient was in whole or in part for the following MEDICAL CONDITION: (primary reason for [place]) MEDICAL NECESSITY: I certify, that based on my findings, NURSING services are a medically necessary home health service. HOME BOUND STATUS: I certify that my clinical findings support that this patient is homebound (i.e., Due to illness or injury, pt requires aid of supportive devices such as crutches, cane, wheelchairs, walkers, the use of special transportation or the assistance of another person to leave their place of residence. There is a normal inability to leave the home and doing so requires considerable and taxing effort. Other absences are for medical reasons / religious services and are infrequent or of short duration when for other reasons). o If current dressing causes regression in wound condition, may D/C ordered dressing product/s and apply Normal Saline Moist Dressing daily until next  [facility] / Other MD appointment. [place] of regression in wound condition at [phone]. o Please direct any NON-WOUND related issues/requests for orders to patient's Primary Care Physician Electronic Signature(s) Signed: 02/29/2016 5:31:30 PM By: Londell Moh FNP Signed: 02/29/2016 5:50:35 PM By: Montey Hora Entered By: Montey Hora on 02/29/2016 13:33:15 [name] ([id number]) -------------------------------------------------------------------------------- Problem List Details Patient Name: JOHARA, LODWICK 02/29/2016 12:45 Date of Service: PM Medical Record [id number] Number: Patient Account Number: [id number] 1940-06-04 (75 y.o. Treating RN: Montey Hora Date of Birth/Sex: Female) Other Clinician: Primary Care Physician: Glendale Chard Treating Londell Moh Referring Physician: Glendale Chard Physician/Extender: Suella Grove in Treatment: 4 Active Problems ICD-10 Encounter Code Description Active  Date Diagnosis E11.622 Type 2 diabetes mellitus with other skin ulcer 02/01/2016 Yes L97.222 Non-pressure chronic ulcer of left calf with fat layer 02/01/2016 Yes exposed I89.0 Lymphedema, not elsewhere classified 02/01/2016 Yes Inactive Problems Resolved Problems Electronic Signature(s) Signed: 02/29/2016 5:31:30 PM By: Londell Moh FNP Entered By: Londell Moh on 02/29/2016 13:38:55 [name] ([id number]) -------------------------------------------------------------------------------- Progress Note Details Patient Name: [name] 02/29/2016 12:45 Date of Service: PM Medical Record [id number] Number: Patient Account Number: [id number] 08-27-1940 (75 y.o. Treating RN: Montey Hora Date of Birth/Sex: Female) Other Clinician: Primary Care Physician: Glendale Chard Treating Londell Moh Referring Physician: Glendale Chard Physician/Extender: Suella Grove in Treatment: 4 Subjective Chief Complaint Information obtained from Patient Patients presents for treatment of an open diabetic ulcer the left lower extremity which she's had for about 4 weeks History of Present Illness (HPI) The following HPI elements were documented for the patient's wound: Location: left lower extremity lateral calf Quality: Patient reports experiencing a dull pain to affected area(s). Severity: Patient states wound are getting worse. Duration: Patient has had the wound for < 4 weeks prior to presenting for treatment Timing: Pain in wound is Intermittent (comes and goes Context: The wound occurred when the patient was injured with a sharp corner for car door Modifying Factors: Other treatment(s) tried include:local care and 3 courses of antibiotics including Keflex, Bactrim and doxycycline Associated Signs and Symptoms: Patient reports having increase swelling. 75 year old patient with a past medical history of diabetes mellitus, chronic kidney disease, COPD, hypertension, rheumatoid  arthritis had a lacerated wound to her left calf when she injured it on the corner of a car door and it was a long 12 cm laceration. x-ray done at the time revealed that there was no fracture or radiopaque foreign body. the wound was sutured with 4-0 Prolene simple interrupted sutures and a total of 14 sutures were applied. on August 31 she was seen in the ER and it was noted that the wound had dehisced and she was treated with Keflex. She has completed a course of doxycycline recently Medical history also significant for status post abdominal hysterectomy, brain surgery, cardiac catheterization, EGD and bronchoscopy. of late she was back in the ER and was seen last on August 31 and they tried Silvadene 02/22/16: pt reports discomfort and mild pain just below her wound. however, she admits on legs more than usual. no redness, foul odor or purulence. the wound is measuring slightly larger today, but likely secondary to poor edema control. nursing staff gave information to pt today regarding purchase of her compression stockings. she was encouraged to purchase soon. 02/29/16: returns today for f/u. she denies systemic s/s of infection. she brought her new compression stockings today. MANESSA, BULEY ([id number]) Objective Constitutional Vitals Time Taken: 1:00 PM, Temperature: 98.4 F, Pulse: 93  bpm, Respiratory Rate: 18 breaths/min, Blood Pressure: 115/68 mmHg. Integumentary (Hair, Skin) Wound #1 status is Open. Original cause of wound was Trauma. The wound is located on the Left,Lateral Lower Leg. The wound measures 5cm length x 4.4cm width x 0.3cm depth; 17.279cm^2 area and 5.184cm^3 volume. The wound is limited to skin breakdown. There is no tunneling or undermining noted. There is a medium amount of sanguinous drainage noted. The wound margin is distinct with the outline attached to the wound base. There is medium (34-66%) red granulation within the wound bed. There is a medium  (34-66%) amount of necrotic tissue within the wound bed including Eschar and Adherent Slough. The periwound skin appearance exhibited: Induration, Localized Edema, Scarring, Moist, Ecchymosis, Hemosiderin Staining, Erythema. The periwound skin appearance did not exhibit: Callus, Crepitus, Excoriation, Fluctuance, Friable, Rash, Dry/Scaly, Maceration, Atrophie Blanche, Cyanosis, Mottled, Pallor, Rubor. The surrounding wound skin color is noted with erythema. Periwound temperature was noted as No Abnormality. The periwound has tenderness on palpation. Assessment Active Problems ICD-10 E11.622 - Type 2 diabetes mellitus with other skin ulcer L97.222 - Non-pressure chronic ulcer of left calf with fat layer exposed I89.0 - Lymphedema, not elsewhere classified Diagnoses ICD-10 E11.622: Type 2 diabetes mellitus with other skin ulcer L97.222: Non-pressure chronic ulcer of left calf with fat layer exposed I89.0: Lymphedema, not elsewhere classified Procedures Wound #1 [place], [place] E. ([id number]) Wound #1 is a Diabetic Wound/Ulcer of the Lower Extremity located on the Left,Lateral Lower Leg . There was a Skin/Subcutaneous Tissue Debridement (60630-16010) debridement with total area of 22 sq cm performed by Londell Moh, NP. with the following instrument(s): Curette to remove Viable and Non- Viable tissue/material including Fibrin/Slough, Eschar, and Subcutaneous after achieving pain control using Lidocaine 4% Topical Solution. A time out was conducted at 13:28, prior to the start of the procedure. A Minimum amount of bleeding was controlled with Silver Nitrate. The procedure was tolerated well with a pain level of 0 throughout and a pain level of 0 following the procedure. Post Debridement Measurements: 5cm length x 4.4cm width x 0.5cm depth; 8.639cm^3 volume. Character of Wound/Ulcer Post Debridement is improved. Severity of Tissue Post Debridement is: Fat layer exposed. Post procedure  Diagnosis Wound #1: Same as Pre-Procedure Hypergranulation tissue noted over the wound bed. chemical cauterization with silver nitrate x 2 for management. discussed the wound bed wound be darkened due to the silver. pt verbalized understanding. Plan Wound Cleansing: Wound #1 Left,Lateral Lower Leg: Clean wound with Normal Saline. May Shower, gently pat wound dry prior to applying new dressing. Anesthetic: Wound #1 Left,Lateral Lower Leg: Topical Lidocaine 4% cream applied to wound bed prior to debridement Skin Barriers/Peri-Wound Care: Wound #1 Left,Lateral Lower Leg: Skin Prep Primary Wound Dressing: Wound #1 Left,Lateral Lower Leg: Iodoflex Secondary Dressing: Wound #1 Left,Lateral Lower Leg: Dry Gauze Boardered Foam Dressing Dressing Change Frequency: Wound #1 Left,Lateral Lower Leg: Change dressing every day. Follow-up Appointments: Wound #1 Left,Lateral Lower Leg: Return Appointment in 1 week. Edema Control: Wound #1 Left,Lateral Lower Leg: Patient to wear own compression stockings Home Health: LAURALYN, SHADOWENS ([id number]) Wound #1 Left,Lateral Lower Leg: [place] Visits - [place] [facility] to visit patient 2 times weekly, family to change dressing all other days Home Health Nurse may visit PRN to address patient s wound care needs. FACE TO FACE ENCOUNTER: MEDICARE and MEDICAID PATIENTS: I certify that this patient is under my care and that I had a face-to-face encounter that meets the physician face-to-face encounter requirements with this patient  on this date. The encounter with the patient was in whole or in part for the following MEDICAL CONDITION: (primary reason for [place]) MEDICAL NECESSITY: I certify, that based on my findings, NURSING services are a medically necessary home health service. HOME BOUND STATUS: I certify that my clinical findings support that this patient is homebound (i.e., Due to illness or injury, pt requires aid of  supportive devices such as crutches, cane, wheelchairs, walkers, the use of special transportation or the assistance of another person to leave their place of residence. There is a normal inability to leave the home and doing so requires considerable and taxing effort. Other absences are for medical reasons / religious services and are infrequent or of short duration when for other reasons). If current dressing causes regression in wound condition, may D/C ordered dressing product/s and apply Normal Saline Moist Dressing daily until next [place] / Other MD appointment. [place] of regression in wound condition at [phone]. Please direct any NON-WOUND related issues/requests for orders to patient's Primary Care Physician 1. discussed clincal findings and implications with pt. all questions answered. 2. d/c santyl. wound progress appears to be stalled at this point. 3. initiate idoflex to be applied qod and prn. see orders above. 4. pt advised to apply compression stockings each am and remove at pm. [place], this will manage her edema which will also promote wound healing. Electronic Signature(s) Signed: 02/29/2016 5:31:30 PM By: Londell Moh FNP Entered By: Londell Moh on 02/29/2016 13:44:13 [name] ([id number]) -------------------------------------------------------------------------------- SuperBill Details Patient Name: [name]. Date of Service: 02/29/2016 Medical Record Number: [id number] Patient Account Number: [id number] Date of Birth/Sex: December 21, 1940 (75 y.o. Female) Treating RN: Montey Hora Primary Care Physician: Glendale Chard Other Clinician: Referring Physician: Glendale Chard Treating Physician/Extender: Loistine Chance in Treatment: 4 Diagnosis Coding ICD-10 Codes Code Description E11.622 Type 2 diabetes mellitus with other skin ulcer L97.222 Non-pressure chronic ulcer of left calf with fat layer  exposed I89.0 Lymphedema, not elsewhere classified Facility Procedures CPT4 Code: 79558316 Description: 74255 - DEB SUBQ TISSUE 20 SQ CM/< ICD-10 Description Diagnosis E11.622 Type 2 diabetes mellitus with other skin ulcer L97.222 Non-pressure chronic ulcer of left calf with fat l Modifier: ayer exposed Quantity: 1 CPT4 Code: [id number] Description: 75830 - DEB SUBQ TISS EA ADDL 20CM ICD-10 Description Diagnosis E11.622 Type 2 diabetes mellitus with other skin ulcer L97.222 Non-pressure chronic ulcer of left calf with fat l Modifier: ayer exposed Quantity: 1 Physician Procedures CPT4 Code: 7460029 Description: 11042 - WC PHYS SUBQ TISS 20 SQ CM ICD-10 Description Diagnosis E11.622 Type 2 diabetes mellitus with other skin ulcer L97.222 Non-pressure chronic ulcer of left calf with fat la Modifier: yer exposed Quantity: 1 CPT4 Code: [id number] Kenan, DELO Description: 11045 - WC PHYS SUBQ TISS EA ADDL 20 CM ICD-10 Description Diagnosis E11.622 Type 2 diabetes mellitus with other skin ulcer L97.222 Non-pressure chronic ulcer of left calf with fat la RIS E. ([id number]) Modifier: yer exposed Quantity: 1 Electronic Signature(s) Signed: 02/29/2016 5:31:30 PM By: Londell Moh FNP Entered By: Londell Moh on 02/29/2016 13:48:02

## 2016-03-03 DIAGNOSIS — S81812D Laceration without foreign body, left lower leg, subsequent encounter: Secondary | ICD-10-CM | POA: Diagnosis not present

## 2016-03-03 DIAGNOSIS — E1129 Type 2 diabetes mellitus with other diabetic kidney complication: Secondary | ICD-10-CM | POA: Diagnosis not present

## 2016-03-03 DIAGNOSIS — T798XXD Other early complications of trauma, subsequent encounter: Secondary | ICD-10-CM | POA: Diagnosis not present

## 2016-03-03 DIAGNOSIS — I5032 Chronic diastolic (congestive) heart failure: Secondary | ICD-10-CM | POA: Diagnosis not present

## 2016-03-03 DIAGNOSIS — E1140 Type 2 diabetes mellitus with diabetic neuropathy, unspecified: Secondary | ICD-10-CM | POA: Diagnosis not present

## 2016-03-03 DIAGNOSIS — I130 Hypertensive heart and chronic kidney disease with heart failure and stage 1 through stage 4 chronic kidney disease, or unspecified chronic kidney disease: Secondary | ICD-10-CM | POA: Diagnosis not present

## 2016-03-05 DIAGNOSIS — E1140 Type 2 diabetes mellitus with diabetic neuropathy, unspecified: Secondary | ICD-10-CM | POA: Diagnosis not present

## 2016-03-05 DIAGNOSIS — I5032 Chronic diastolic (congestive) heart failure: Secondary | ICD-10-CM | POA: Diagnosis not present

## 2016-03-05 DIAGNOSIS — E1129 Type 2 diabetes mellitus with other diabetic kidney complication: Secondary | ICD-10-CM | POA: Diagnosis not present

## 2016-03-05 DIAGNOSIS — I130 Hypertensive heart and chronic kidney disease with heart failure and stage 1 through stage 4 chronic kidney disease, or unspecified chronic kidney disease: Secondary | ICD-10-CM | POA: Diagnosis not present

## 2016-03-05 DIAGNOSIS — S81812D Laceration without foreign body, left lower leg, subsequent encounter: Secondary | ICD-10-CM | POA: Diagnosis not present

## 2016-03-05 DIAGNOSIS — T798XXD Other early complications of trauma, subsequent encounter: Secondary | ICD-10-CM | POA: Diagnosis not present

## 2016-03-06 ENCOUNTER — Encounter: Payer: Self-pay | Admitting: Registered Nurse

## 2016-03-06 ENCOUNTER — Encounter: Payer: Medicare Other | Attending: Physical Medicine & Rehabilitation | Admitting: Registered Nurse

## 2016-03-06 VITALS — BP 121/71 | HR 88 | Resp 14

## 2016-03-06 DIAGNOSIS — M7062 Trochanteric bursitis, left hip: Secondary | ICD-10-CM | POA: Diagnosis not present

## 2016-03-06 DIAGNOSIS — J449 Chronic obstructive pulmonary disease, unspecified: Secondary | ICD-10-CM | POA: Insufficient documentation

## 2016-03-06 DIAGNOSIS — Z808 Family history of malignant neoplasm of other organs or systems: Secondary | ICD-10-CM | POA: Diagnosis not present

## 2016-03-06 DIAGNOSIS — Z79899 Other long term (current) drug therapy: Secondary | ICD-10-CM

## 2016-03-06 DIAGNOSIS — E1142 Type 2 diabetes mellitus with diabetic polyneuropathy: Secondary | ICD-10-CM | POA: Diagnosis not present

## 2016-03-06 DIAGNOSIS — M545 Low back pain: Secondary | ICD-10-CM | POA: Insufficient documentation

## 2016-03-06 DIAGNOSIS — F329 Major depressive disorder, single episode, unspecified: Secondary | ICD-10-CM | POA: Diagnosis not present

## 2016-03-06 DIAGNOSIS — M1288 Other specific arthropathies, not elsewhere classified, other specified site: Secondary | ICD-10-CM

## 2016-03-06 DIAGNOSIS — E079 Disorder of thyroid, unspecified: Secondary | ICD-10-CM | POA: Insufficient documentation

## 2016-03-06 DIAGNOSIS — J8410 Pulmonary fibrosis, unspecified: Secondary | ICD-10-CM | POA: Diagnosis not present

## 2016-03-06 DIAGNOSIS — M1711 Unilateral primary osteoarthritis, right knee: Secondary | ICD-10-CM

## 2016-03-06 DIAGNOSIS — Z801 Family history of malignant neoplasm of trachea, bronchus and lung: Secondary | ICD-10-CM | POA: Diagnosis not present

## 2016-03-06 DIAGNOSIS — E1140 Type 2 diabetes mellitus with diabetic neuropathy, unspecified: Secondary | ICD-10-CM

## 2016-03-06 DIAGNOSIS — Z803 Family history of malignant neoplasm of breast: Secondary | ICD-10-CM | POA: Insufficient documentation

## 2016-03-06 DIAGNOSIS — D689 Coagulation defect, unspecified: Secondary | ICD-10-CM | POA: Diagnosis not present

## 2016-03-06 DIAGNOSIS — M1712 Unilateral primary osteoarthritis, left knee: Secondary | ICD-10-CM | POA: Diagnosis not present

## 2016-03-06 DIAGNOSIS — G894 Chronic pain syndrome: Secondary | ICD-10-CM

## 2016-03-06 DIAGNOSIS — M069 Rheumatoid arthritis, unspecified: Secondary | ICD-10-CM | POA: Diagnosis not present

## 2016-03-06 DIAGNOSIS — Z9889 Other specified postprocedural states: Secondary | ICD-10-CM | POA: Diagnosis not present

## 2016-03-06 DIAGNOSIS — I129 Hypertensive chronic kidney disease with stage 1 through stage 4 chronic kidney disease, or unspecified chronic kidney disease: Secondary | ICD-10-CM | POA: Diagnosis not present

## 2016-03-06 DIAGNOSIS — R200 Anesthesia of skin: Secondary | ICD-10-CM | POA: Diagnosis not present

## 2016-03-06 DIAGNOSIS — M48062 Spinal stenosis, lumbar region with neurogenic claudication: Secondary | ICD-10-CM | POA: Diagnosis not present

## 2016-03-06 DIAGNOSIS — E7800 Pure hypercholesterolemia, unspecified: Secondary | ICD-10-CM | POA: Insufficient documentation

## 2016-03-06 DIAGNOSIS — E1122 Type 2 diabetes mellitus with diabetic chronic kidney disease: Secondary | ICD-10-CM | POA: Diagnosis not present

## 2016-03-06 DIAGNOSIS — Z833 Family history of diabetes mellitus: Secondary | ICD-10-CM | POA: Insufficient documentation

## 2016-03-06 DIAGNOSIS — N189 Chronic kidney disease, unspecified: Secondary | ICD-10-CM | POA: Diagnosis not present

## 2016-03-06 DIAGNOSIS — M170 Bilateral primary osteoarthritis of knee: Secondary | ICD-10-CM | POA: Diagnosis not present

## 2016-03-06 DIAGNOSIS — M62838 Other muscle spasm: Secondary | ICD-10-CM | POA: Diagnosis not present

## 2016-03-06 DIAGNOSIS — M47816 Spondylosis without myelopathy or radiculopathy, lumbar region: Secondary | ICD-10-CM

## 2016-03-06 DIAGNOSIS — Z8249 Family history of ischemic heart disease and other diseases of the circulatory system: Secondary | ICD-10-CM | POA: Insufficient documentation

## 2016-03-06 DIAGNOSIS — Z8051 Family history of malignant neoplasm of kidney: Secondary | ICD-10-CM | POA: Diagnosis not present

## 2016-03-06 DIAGNOSIS — Z5181 Encounter for therapeutic drug level monitoring: Secondary | ICD-10-CM

## 2016-03-06 MED ORDER — MORPHINE SULFATE ER 15 MG PO TBCR: 15.0000 mg | EXTENDED_RELEASE_TABLET | Freq: Two times a day (BID) | ORAL | 0 refills | Status: DC

## 2016-03-06 NOTE — Progress Notes (Signed)
Subjective:    Patient ID: [name], female    DOB: 1941/04/15, 75 y.o.   MRN: [id number]  HPI: [name] is a 75 year old female who returns for follow up appointment and medication refill. She states her pain is located in her lower back radiating into her left hip and bilateral knee pain. She  ratesher pain 5. Her current exercise regime is walking.   Receiving Wound care from the [facility] changing her left lower extremity dressing daily.   Pain Inventory Average Pain 5 Pain Right Now 5 My pain is sharp, burning, dull and stabbing  In the last 24 hours, has pain interfered with the following? General activity 7 Relation with others 7 Enjoyment of life 7 What TIME of day is your pain at its worst? morning Sleep (in general) Fair  Pain is worse with: walking, bending and standing Pain improves with: rest, heat/ice and medication Relief from Meds: 7  Mobility use a cane use a walker how many minutes can you walk? 7  ability to climb steps?  no do you drive?  no use a wheelchair Do you have any goals in this area?  no  Function retired I need assistance with the following:  meal prep, household duties and shopping Do you have any goals in this area?  yes  Neuro/Psych bladder control problems weakness numbness trouble walking spasms anxiety  Prior Studies Any changes since last visit?  no  Physicians involved in your care Any changes since last visit?  no   Family History  Problem Relation Age of Onset  . Diabetes Mother   . Heart attack Mother   . Hypertension Father   . Lung cancer Father   . Diabetes Sister   . Diabetes Brother   . Hypertension Brother   . Heart disease Brother   . Heart attack Brother   . Kidney cancer Brother   . Uterine cancer Daughter   . Breast cancer Sister   . Rheum arthritis Maternal Uncle   . Gout Brother   . Kidney failure Brother     x 5   Social  History   Social History  . Marital status: Married    Spouse name: N/A  . Number of children: 6  . Years of education: college   Occupational History  . retired Marine scientist    Social History Main Topics  . Smoking status: Never Smoker  . Smokeless tobacco: Never Used  . Alcohol use No  . Drug use: No  . Sexual activity: Not Currently   Other Topics Concern  . None   Social History Narrative   Patient consumes 2-3 cups coffee per day.   Past Surgical History:  Procedure Laterality Date  . ABDOMINAL HYSTERECTOMY    . BRAIN SURGERY     Gamma knife 10/13. Needs repeat spring  '14  . CARDIAC CATHETERIZATION N/A 10/31/2014   Procedure: Right/Left Heart Cath and Coronary Angiography;  Surgeon: [name], MD;  Location: [facility];  Service: Cardiovascular;  Laterality: N/A;  . ESOPHAGOGASTRODUODENOSCOPY (EGD) WITH PROPOFOL N/A 09/14/2014   Procedure: ESOPHAGOGASTRODUODENOSCOPY (EGD) WITH PROPOFOL;  Surgeon: [name], MD;  Location: [facility];  Service: Endoscopy;  Laterality: N/A;  . OVARY SURGERY    . SHOULDER SURGERY Left   . TONSILLECTOMY  age 79  . VIDEO BRONCHOSCOPY Bilateral 05/31/2013   Procedure: VIDEO BRONCHOSCOPY WITHOUT FLUORO;  Surgeon: Brand Males, MD;  Location: [facility];  Service: Cardiopulmonary;  Laterality: Bilateral;  . video bronscoscopy  2000   lung   Past Medical History:  Diagnosis Date  . Arthritis    endstage changes bilateral knees/bilateral ankles.   . Asthma   . Carotid artery occlusion   . Chronic kidney disease   . Clotting disorder ([place])    pt denies this  . Contusion of left knee    due to fall 1/14.  [name] COPD (chronic obstructive pulmonary disease) (HCC)    pulmonary fibrosis  . Depression, reactive   . Diabetes mellitus   . Diastolic dysfunction   . Family history of heart disease   . High cholesterol   . Hypertension   . Interstitial lung disease ([place])   . Meningioma of left sphenoid wing involving  cavernous sinus (HCC) 02/17/2012   Continue diplopia, left eye pain and left headaches.    . Morbid obesity ([place])   . Normal coronary arteries    cardiac catheterization performed  10/31/14  . RA (rheumatoid arthritis) ([place])    has been off methotreaxte since 10/13.  [name] Shortness of breath   . Thyroid disease   . URI (upper respiratory infection)    BP 91/61   Pulse (!) 103   Resp 14   SpO2 (!) 86%   Opioid Risk Score:   Fall Risk Score:  `1  Depression screen PHQ 2/9  Depression screen [facility] 2/9 08/09/2015 01/05/2015 09/20/2014 08/10/2013  Decreased Interest _0 -  Down, Depressed, Hopeless _1 PHQ - 2 Score _2 Altered sleeping - 2 0 -  Tired, decreased energy - 2 3 -  Change in appetite - 2 3 -  Feeling bad or failure about yourself  - 0 3 -  Trouble concentrating - 1 1 -  Moving slowly or fidgety/restless - 0 2 -  Suicidal thoughts - 0 0 -  PHQ-9 Score - 10 16 -  Difficult doing work/chores - Somewhat difficult - -  Some recent data might be hidden    Review of Systems  Constitutional: Positive for unexpected weight change.  Respiratory: Positive for cough, shortness of breath and wheezing.        Respiratory infection  Endocrine:       High blood sugar  Musculoskeletal:       Limb swelling       Objective:   Physical Exam  Constitutional: She is oriented to person, place, and time. She appears well-developed and well-nourished.  HENT:  Head: Normocephalic and atraumatic.  Neck: Normal range of motion. Neck supple.  Cardiovascular: Normal rate and regular rhythm.   Pulmonary/Chest: Effort normal and breath sounds normal.  Neurological: She is alert and oriented to person, place, and time.  Skin: Skin is warm and dry.  Psychiatric: She has a normal mood and affect.  Nursing note and vitals reviewed.         Assessment & Plan:  1. Lumbar spinal stenosis with neurogenic claudication. Associated facet arthropathy: Continue Gabapentin and HEP as  tolerated. Refilled : MS Contin 15 mg one tablet every 12 hours #60 and continue  MSIR 15 mg 1 one tablet every 8 hours as needed #90 no script given. We will continue the opioid monitoring program, this consists of regular clinic visits, examinations, urine drug screen, pill counts as well as use of [place] Controlled Substance reporting System. 2. Depression: Continue Cymbalta 3. Diabetes mellitus type 2 with polyneuropathy:  Continue Gabapentin 4. Rheumatoid arthritis and osteoarthritis. Contnue Voltaren Gel 5. Interstitial lung disease: Continuous Oxygen and Pulmonology Following.   6. Bilateral Osteoarthritis Knee's: Continue Voltaren Gel 7. Left Greater Trochanteric Bursitis: Continue Voltaren Gel, Heat and Ice Therapy 8. Muscle Spasm: Continue : Flexeril  30 minutes of face to face patient care time was spent during this visit. All questions were encouraged and answered.  F/U in 1 month

## 2016-03-07 ENCOUNTER — Encounter: Payer: Medicare Other | Admitting: Surgery

## 2016-03-07 DIAGNOSIS — E11622 Type 2 diabetes mellitus with other skin ulcer: Secondary | ICD-10-CM | POA: Diagnosis not present

## 2016-03-07 DIAGNOSIS — N189 Chronic kidney disease, unspecified: Secondary | ICD-10-CM | POA: Diagnosis not present

## 2016-03-07 DIAGNOSIS — L97222 Non-pressure chronic ulcer of left calf with fat layer exposed: Secondary | ICD-10-CM | POA: Diagnosis not present

## 2016-03-07 DIAGNOSIS — E1122 Type 2 diabetes mellitus with diabetic chronic kidney disease: Secondary | ICD-10-CM | POA: Diagnosis not present

## 2016-03-07 DIAGNOSIS — J449 Chronic obstructive pulmonary disease, unspecified: Secondary | ICD-10-CM | POA: Diagnosis not present

## 2016-03-07 DIAGNOSIS — I890 Lymphedema, not elsewhere classified: Secondary | ICD-10-CM | POA: Diagnosis not present

## 2016-03-08 NOTE — Progress Notes (Signed)
TEANNA, ELEM ([id number]) Visit Report for 03/07/2016 Chief Complaint Document Details Patient Name: [name], [name] 03/07/2016 1:30 Date of Service: PM Medical Record [id number] Number: Patient Account Number: [id number] [birth year] (75 y.o. Treating RN: Montey Hora Date of Birth/Sex: Female) Other Clinician: Primary Care Physician: Glendale Chard Treating Christin Fudge Referring Physician: Glendale Chard Physician/Extender: Suella Grove in Treatment: 5 Information Obtained from: Patient Chief Complaint Patients presents for treatment of an open diabetic ulcer the left lower extremity which she's had for about 4 weeks Electronic Signature(s) Signed: 03/07/2016 2:23:02 PM By: Christin Fudge MD, FACS Entered By: Christin Fudge on 03/07/2016 14:23:02 [name] ([id number]) -------------------------------------------------------------------------------- Debridement Details Patient Name: [name]. 03/07/2016 1:30 Date of Service: PM Medical Record [id number] Number: Patient Account Number: [id number] [birth year] (75 y.o. Treating RN: Montey Hora Date of Birth/Sex: Female) Other Clinician: Primary Care Physician: Glendale Chard Treating Renton Berkley Referring Physician: Glendale Chard Physician/Extender: Suella Grove in Treatment: 5 Debridement Performed for Wound #1 Left,Lateral Lower Leg Assessment: Performed By: Physician Christin Fudge, MD Debridement: Debridement Pre-procedure Yes - 14:11 Verification/Time Out Taken: Start Time: 14:11 Pain Control: Lidocaine 4% Topical Solution Level: Skin/Subcutaneous Tissue Total Area Debrided (L x 3 (cm) x 3 (cm) = 9 (cm) W): Tissue and other Viable, Non-Viable, Fibrin/Slough, Subcutaneous material debrided: Instrument: Curette Bleeding: Minimum Hemostasis Achieved: Pressure End Time: 14:14 Procedural Pain: 0 Post Procedural Pain: 0 Response to Treatment: Procedure was tolerated well Post Debridement  Measurements of Total Wound Length: (cm) 5 Width: (cm) 4.5 Depth: (cm) 1 Volume: (cm) 17.671 Character of Wound/Ulcer Post Improved Debridement: Severity of Tissue Post Debridement: Fat layer exposed Post Procedure Diagnosis Same as Pre-procedure Electronic Signature(s) Signed: 03/07/2016 2:22:56 PM By: Christin Fudge MD, FACS Signed: 03/07/2016 4:59:11 PM By: Durwin Nora ([id number]) Entered By: Christin Fudge on 03/07/2016 14:22:55 [name] ([id number]) -------------------------------------------------------------------------------- HPI Details Patient Name: [name], [name] 03/07/2016 1:30 Date of Service: PM Medical Record [id number] Number: Patient Account Number: [id number] 1941/01/04 (75 y.o. Treating RN: Montey Hora Date of Birth/Sex: Female) Other Clinician: Primary Care Physician: Glendale Chard Treating Pacey Altizer Referring Physician: Glendale Chard Physician/Extender: Suella Grove in Treatment: 5 History of Present Illness Location: left lower extremity lateral calf Quality: Patient reports experiencing a dull pain to affected area(s). Severity: Patient states wound are getting worse. Duration: Patient has had the wound for < 4 weeks prior to presenting for treatment Timing: Pain in wound is Intermittent (comes and goes Context: The wound occurred when the patient was injured with a sharp corner for car door Modifying Factors: Other treatment(s) tried include:local care and 3 courses of antibiotics including Keflex, Bactrim and doxycycline Associated Signs and Symptoms: Patient reports having increase swelling. HPI Description: 75 year old patient with a past medical history of diabetes mellitus, chronic kidney disease, COPD, hypertension, rheumatoid arthritis had a lacerated wound to her left calf when she injured it on the corner of a car door and it was a long 12 cm laceration. x-ray done at the time revealed that there was  no fracture or radiopaque foreign body. the wound was sutured with 4-0 Prolene simple interrupted sutures and a total of 14 sutures were applied. on August 31 she was seen in the ER and it was noted that the wound had dehisced and she was treated with Keflex. She has completed a course of doxycycline recently Medical history also significant for status post abdominal hysterectomy, brain surgery, cardiac catheterization, EGD and bronchoscopy. of late she was back in the ER and was seen  last on August 31 and they tried Silvadene 02/22/16: pt reports discomfort and mild pain just below her wound. however, she admits on legs more than usual. no redness, foul odor or purulence. the wound is measuring slightly larger today, but likely secondary to poor edema control. nursing staff gave information to pt today regarding purchase of her compression stockings. she was encouraged to purchase soon. 02/29/16: returns today for f/u. she denies systemic s/s of infection. she brought her new compression stockings today. Electronic Signature(s) Signed: 03/07/2016 2:23:09 PM By: Christin Fudge MD, FACS Entered By: Christin Fudge on 03/07/2016 14:23:09 [name] ([id number]) -------------------------------------------------------------------------------- Physical Exam Details Patient Name: [name], [name] 03/07/2016 1:30 Date of Service: PM Medical Record [id number] Number: Patient Account Number: [id number] Jan 25, 1941 (75 y.o. Treating RN: Montey Hora Date of Birth/Sex: Female) Other Clinician: Primary Care Physician: Glendale Chard Treating Sidharth Leverette Referring Physician: Glendale Chard Physician/Extender: Weeks in Treatment: 5 Constitutional . Pulse regular. Respirations normal and unlabored. Afebrile. . Eyes Nonicteric. Reactive to light. Ears, Nose, Mouth, and Throat Lips, teeth, and gums WNL.[name] Moist mucosa without lesions. Neck supple and nontender. No palpable  supraclavicular or cervical adenopathy. Normal sized without goiter. Respiratory WNL. No retractions.. Cardiovascular Pedal Pulses WNL. No clubbing, cyanosis or edema. Lymphatic No adneopathy. No adenopathy. No adenopathy. Musculoskeletal Adexa without tenderness or enlargement.. Digits and nails w/o clubbing, cyanosis, infection, petechiae, ischemia, or inflammatory conditions.. Integumentary (Hair, Skin) No suspicious lesions. No crepitus or fluctuance. No peri-wound warmth or erythema. No masses.[name] Psychiatric Judgement and insight Intact.. No evidence of depression, anxiety, or agitation.. Notes the patient's wound looks much cleaner and there is minimal debris at the depths of 2 areas were Sharply removed with a #3 curet and bleeding controlled with pressure. At the anterior aspect of her lower extremity edema has increased much more also just below the knee. I believe this is the cause for her tenderness and shiny skin and I do not believe there is cellulitis. Electronic Signature(s) Signed: 03/07/2016 2:24:01 PM By: Christin Fudge MD, FACS Entered By: Christin Fudge on 03/07/2016 14:24:01 [name] ([id number]) -------------------------------------------------------------------------------- Physician Orders Details Patient Name: [name], [name] 03/07/2016 1:30 Date of Service: PM Medical Record [id number] Number: Patient Account Number: [id number] 03-22-1941 (74 y.o. Treating RN: Montey Hora Date of Birth/Sex: Female) Other Clinician: Primary Care Physician: Glendale Chard Treating Brunetta Newingham Referring Physician: Glendale Chard Physician/Extender: Suella Grove in Treatment: 5 Verbal / Phone Orders: Yes Clinician: Montey Hora Read Back and Verified: Yes Diagnosis Coding Wound Cleansing Wound #1 Left,Lateral Lower Leg o Clean wound with Normal Saline. o May Shower, gently pat wound dry prior to applying new dressing. Anesthetic Wound #1 Left,Lateral  Lower Leg o Topical Lidocaine 4% cream applied to wound bed prior to debridement Skin Barriers/Peri-Wound Care Wound #1 Left,Lateral Lower Leg o Skin Prep Primary Wound Dressing Wound #1 Left,Lateral Lower Leg o Hydrafera Blue Secondary Dressing Wound #1 Left,Lateral Lower Leg o Dry Gauze o Boardered Foam Dressing Dressing Change Frequency Wound #1 Left,Lateral Lower Leg o Change dressing every day. Follow-up Appointments Wound #1 Left,Lateral Lower Leg o Return Appointment in 1 week. Edema Control Wound #1 Left,Lateral Lower Leg o Patient to wear own compression stockings ROCKELLE, HEUERMAN ([id number]) Home Health Wound #1 [place] Visits [facility] to visit patient 2 times weekly, family to change dressing all other days o Home Health Nurse may visit PRN to address patientos wound care needs. o FACE TO FACE ENCOUNTER: MEDICARE  and MEDICAID PATIENTS: I certify that this patient is under my care and that I had a face-to-face encounter that meets the physician face-to-face encounter requirements with this patient on this date. The encounter with the patient was in whole or in part for the following MEDICAL CONDITION: (primary reason for [place]) MEDICAL NECESSITY: I certify, that based on my findings, NURSING services are a medically necessary home health service. HOME BOUND STATUS: I certify that my clinical findings support that this patient is homebound (i.e., Due to illness or injury, pt requires aid of supportive devices such as crutches, cane, wheelchairs, walkers, the use of special transportation or the assistance of another person to leave their place of residence. There is a normal inability to leave the home and doing so requires considerable and taxing effort. Other absences are for medical reasons / religious services and are infrequent or of short duration when for other reasons). o If  current dressing causes regression in wound condition, may D/C ordered dressing product/s and apply Normal Saline Moist Dressing daily until next [place] / Other MD appointment. [place] of regression in wound condition at [phone]. o Please direct any NON-WOUND related issues/requests for orders to patient's Primary Care Physician Electronic Signature(s) Signed: 03/07/2016 4:40:13 PM By: Christin Fudge MD, FACS Signed: 03/07/2016 4:59:11 PM By: Montey Hora Entered By: Montey Hora on 03/07/2016 14:13:58 [name] ([id number]) -------------------------------------------------------------------------------- Problem List Details Patient Name: [name], [name] 03/07/2016 1:30 Date of Service: PM Medical Record [id number] Number: Patient Account Number: [id number] [birth year] (75 y.o. Treating RN: Montey Hora Date of Birth/Sex: Female) Other Clinician: Primary Care Physician: Glendale Chard Treating Christin Fudge Referring Physician: Glendale Chard Physician/Extender: Weeks in Treatment: 5 Active Problems ICD-10 Encounter Code Description Active Date Diagnosis E11.622 Type 2 diabetes mellitus with other skin ulcer 02/01/2016 Yes L97.222 Non-pressure chronic ulcer of left calf with fat layer 02/01/2016 Yes exposed I89.0 Lymphedema, not elsewhere classified 02/01/2016 Yes Inactive Problems Resolved Problems Electronic Signature(s) Signed: 03/07/2016 2:22:32 PM By: Christin Fudge MD, FACS Entered By: Christin Fudge on 03/07/2016 14:22:32 [name] ([id number]) -------------------------------------------------------------------------------- Progress Note Details Patient Name: [name]. 03/07/2016 1:30 Date of Service: PM Medical Record [id number] Number: Patient Account Number: [id number] May 16, 1941 (75 y.o. Treating RN: Montey Hora Date of Birth/Sex: Female) Other Clinician: Primary Care Physician:  Glendale Chard Treating Eduar Kumpf Referring Physician: Glendale Chard Physician/Extender: Suella Grove in Treatment: 5 Subjective Chief Complaint Information obtained from Patient Patients presents for treatment of an open diabetic ulcer the left lower extremity which she's had for about 4 weeks History of Present Illness (HPI) The following HPI elements were documented for the patient's wound: Location: left lower extremity lateral calf Quality: Patient reports experiencing a dull pain to affected area(s). Severity: Patient states wound are getting worse. Duration: Patient has had the wound for < 4 weeks prior to presenting for treatment Timing: Pain in wound is Intermittent (comes and goes Context: The wound occurred when the patient was injured with a sharp corner for car door Modifying Factors: Other treatment(s) tried include:local care and 3 courses of antibiotics including Keflex, Bactrim and doxycycline Associated Signs and Symptoms: Patient reports having increase swelling. 75 year old patient with a past medical history of diabetes mellitus, chronic kidney disease, COPD, hypertension, rheumatoid arthritis had a lacerated wound to her left calf when she injured it on the corner of a car door and it was a long 12 cm laceration. x-ray done at the time revealed that there was  no fracture or radiopaque foreign body. the wound was sutured with 4-0 Prolene simple interrupted sutures and a total of 14 sutures were applied. on August 31 she was seen in the ER and it was noted that the wound had dehisced and she was treated with Keflex. She has completed a course of doxycycline recently Medical history also significant for status post abdominal hysterectomy, brain surgery, cardiac catheterization, EGD and bronchoscopy. of late she was back in the ER and was seen last on August 31 and they tried Silvadene 02/22/16: pt reports discomfort and mild pain just below her wound. however, she  admits on legs more than usual. no redness, foul odor or purulence. the wound is measuring slightly larger today, but likely secondary to poor edema control. nursing staff gave information to pt today regarding purchase of her compression stockings. she was encouraged to purchase soon. 02/29/16: returns today for f/u. she denies systemic s/s of infection. she brought her new compression stockings today. [name], [name] ([id number]) Objective Constitutional Pulse regular. Respirations normal and unlabored. Afebrile. Vitals Time Taken: 1:57 PM, Temperature: 98.1 F, Pulse: 108 bpm, Respiratory Rate: 16 breaths/min, Blood Pressure: 122/64 mmHg. Eyes Nonicteric. Reactive to light. Ears, Nose, Mouth, and Throat Lips, teeth, and gums WNL.[name] Moist mucosa without lesions. Neck supple and nontender. No palpable supraclavicular or cervical adenopathy. Normal sized without goiter. Respiratory WNL. No retractions.. Cardiovascular Pedal Pulses WNL. No clubbing, cyanosis or edema. Lymphatic No adneopathy. No adenopathy. No adenopathy. Musculoskeletal Adexa without tenderness or enlargement.. Digits and nails w/o clubbing, cyanosis, infection, petechiae, ischemia, or inflammatory conditions.[name] Psychiatric Judgement and insight Intact.. No evidence of depression, anxiety, or agitation.. General Notes: the patient's wound looks much cleaner and there is minimal debris at the depths of 2 areas were Sharply removed with a #3 curet and bleeding controlled with pressure. At the anterior aspect of her lower extremity edema has increased much more also just below the knee. I believe this is the cause for her tenderness and shiny skin and I do not believe there is cellulitis. Integumentary (Hair, Skin) No suspicious lesions. No crepitus or fluctuance. No peri-wound warmth or erythema. No masses.. Wound #1 status is Open. Original cause of wound was Trauma. The wound is located on the Left,Lateral Lower  Leg. The wound measures 5cm length x 4.5cm width x 0.9cm depth; 17.671cm^2 area and 15.904cm^3 volume. The wound is limited to skin breakdown. There is no tunneling or undermining noted. There is a medium amount of sanguinous drainage noted. The wound margin is distinct with the outline [name], [name] ([id number]) attached to the wound base. There is medium (34-66%) red granulation within the wound bed. There is a medium (34-66%) amount of necrotic tissue within the wound bed including Eschar and Adherent Slough. The periwound skin appearance exhibited: Induration, Localized Edema, Scarring, Moist, Ecchymosis, Hemosiderin Staining, Erythema. The periwound skin appearance did not exhibit: Callus, Crepitus, Excoriation, Fluctuance, Friable, Rash, Dry/Scaly, Maceration, Atrophie Blanche, Cyanosis, Mottled, Pallor, Rubor. The surrounding wound skin color is noted with erythema. Periwound temperature was noted as No Abnormality. The periwound has tenderness on palpation. Assessment Active Problems ICD-10 E11.622 - Type 2 diabetes mellitus with other skin ulcer L97.222 - Non-pressure chronic ulcer of left calf with fat layer exposed I89.0 - Lymphedema, not elsewhere classified I have recommended we change over to [facility] and we should continue her local compression. At the present time I do not believe she needs antibiotics but I have told her to aggressively elevate and exercise  to reduce her lymphedema. If She does develop systemic symptoms she should report to the ER. Procedures Wound #1 Wound #1 is a Diabetic Wound/Ulcer of the Lower Extremity located on the Left,Lateral Lower Leg . There was a Skin/Subcutaneous Tissue Debridement ([id number]) debridement with total area of 9 sq cm performed by Christin Fudge, MD. with the following instrument(s): Curette to remove Viable and Non-Viable tissue/material including Fibrin/Slough and Subcutaneous after achieving pain control using  Lidocaine 4% Topical Solution. A time out was conducted at 14:11, prior to the start of the procedure. A Minimum amount of bleeding was controlled with Pressure. The procedure was tolerated well with a pain level of 0 throughout and a pain level of 0 following the procedure. Post Debridement Measurements: 5cm length x 4.5cm width x 1cm depth; 17.671cm^3 volume. Character of Wound/Ulcer Post Debridement is improved. Severity of Tissue Post Debridement is: Fat layer exposed. Post procedure Diagnosis Wound #1: Same as Pre-Procedure [name], [name]. ([id number]) Plan Wound Cleansing: Wound #1 Left,Lateral Lower Leg: Clean wound with Normal Saline. May Shower, gently pat wound dry prior to applying new dressing. Anesthetic: Wound #1 Left,Lateral Lower Leg: Topical Lidocaine 4% cream applied to wound bed prior to debridement Skin Barriers/Peri-Wound Care: Wound #1 Left,Lateral Lower Leg: Skin Prep Primary Wound Dressing: Wound #1 Left,Lateral Lower Leg: Hydrafera Blue Secondary Dressing: Wound #1 Left,Lateral Lower Leg: Dry Gauze Boardered Foam Dressing Dressing Change Frequency: Wound #1 Left,Lateral Lower Leg: Change dressing every day. Follow-up Appointments: Wound #1 Left,Lateral Lower Leg: Return Appointment in 1 week. Edema Control: Wound #1 Left,Lateral Lower Leg: Patient to wear own compression stockings Home Health: Wound #1 Left,Lateral Lower Leg: Continue Home Health Visits - [facility] to visit patient 2 times weekly, family to change dressing all other days Home Health Nurse may visit PRN to address patient s wound care needs. FACE TO FACE ENCOUNTER: MEDICARE and MEDICAID PATIENTS: I certify that this patient is under my care and that I had a face-to-face encounter that meets the physician face-to-face encounter requirements with this patient on this date. The encounter with the patient was in whole or in part for the following MEDICAL CONDITION: (primary  reason for [place]) MEDICAL NECESSITY: I certify, that based on my findings, NURSING services are a medically necessary home health service. HOME BOUND STATUS: I certify that my clinical findings support that this patient is homebound (i.e., Due to illness or injury, pt requires aid of supportive devices such as crutches, cane, wheelchairs, walkers, the use of special transportation or the assistance of another person to leave their place of residence. There is a normal inability to leave the home and doing so requires considerable and taxing effort. Other absences are for medical reasons / religious services and are infrequent or of short duration when for other reasons). If current dressing causes regression in wound condition, may D/C ordered dressing product/s and apply [name], [name]. ([id number]) Normal Saline Moist Dressing daily until next [place] / Other MD appointment. [place] of regression in wound condition at [phone]. Please direct any NON-WOUND related issues/requests for orders to patient's Primary Care Physician I have recommended we change over to [facility] and we should continue her local compression. At the present time I do not believe she needs antibiotics but I have told her to aggressively elevate and exercise to reduce her lymphedema. If She does develop systemic symptoms she should report to the ER. Electronic Signature(s) Signed: 03/07/2016 2:25:31 PM By: Christin Fudge  MD, FACS Entered By: Christin Fudge on 03/07/2016 14:25:31 [name] ([id number]) -------------------------------------------------------------------------------- SuperBill Details Patient Name: [name]. Date of Service: 03/07/2016 Medical Record Number: [id number] Patient Account Number: [id number] Date of Birth/Sex: 04/08/1941 (75 y.o. Female) Treating RN: Montey Hora Primary Care Physician: Glendale Chard Other  Clinician: Referring Physician: Glendale Chard Treating Physician/Extender: Frann Rider in Treatment: 5 Diagnosis Coding ICD-10 Codes Code Description E11.622 Type 2 diabetes mellitus with other skin ulcer L97.222 Non-pressure chronic ulcer of left calf with fat layer exposed I89.0 Lymphedema, not elsewhere classified Facility Procedures CPT4 Code: [id number] Description: 54270 - DEB SUBQ TISSUE 20 SQ CM/< ICD-10 Description Diagnosis E11.622 Type 2 diabetes mellitus with other skin ulcer L97.222 Non-pressure chronic ulcer of left calf with fat l I89.0 Lymphedema, not elsewhere classified Modifier: ayer exposed Quantity: 1 Physician Procedures CPT4 Code: [id number] Description: 31517 - WC PHYS SUBQ TISS 20 SQ CM ICD-10 Description Diagnosis E11.622 Type 2 diabetes mellitus with other skin ulcer L97.222 Non-pressure chronic ulcer of left calf with fat l I89.0 Lymphedema, not elsewhere classified Modifier: ayer exposed Quantity: 1 Electronic Signature(s) Signed: 03/07/2016 2:25:57 PM By: Christin Fudge MD, FACS Entered By: Christin Fudge on 03/07/2016 14:25:56

## 2016-03-08 NOTE — Progress Notes (Signed)
KELLIS, TOPETE ([id number]) Visit Report for 03/07/2016 Arrival Information Details Patient Name: [name], [name]. Date of Service: 03/07/2016 1:30 PM Medical Record Number: [id number] Patient Account Number: [id number] Date of Birth/Sex: 10/08/1940 (75 y.o. Female) Treating RN: Cornell Barman Primary Care Physician: Glendale Chard Other Clinician: Referring Physician: Glendale Chard Treating Physician/Extender: Frann Rider in Treatment: 5 Visit Information History Since Last Visit Added or deleted any medications: No Patient Arrived: Gilford Rile Any new allergies or adverse reactions: No Arrival Time: 13:54 Had a fall or experienced change in No Accompanied By: self activities of daily living that may affect Transfer Assistance: None risk of falls: Patient Identification Verified: Yes Signs or symptoms of abuse/neglect since last No Secondary Verification Process Completed: Yes visito Patient Has Alerts: Yes Hospitalized since last visit: No Patient Alerts: DM II Has Dressing in Place as Prescribed: Yes Pain Present Now: No Electronic Signature(s) Signed: 03/07/2016 3:22:56 PM By: Gretta Cool, RN, BSN, Kim RN, BSN Entered By: Gretta Cool, RN, BSN, Kim on 03/07/2016 13:57:51 [name] ([id number]) -------------------------------------------------------------------------------- Encounter Discharge Information Details Patient Name: [name]. Date of Service: 03/07/2016 1:30 PM Medical Record Number: [id number] Patient Account Number: [id number] Date of Birth/Sex: 12/29/1940 (75 y.o. Female) Treating RN: Cornell Barman Primary Care Physician: Glendale Chard Other Clinician: Referring Physician: Glendale Chard Treating Physician/Extender: Frann Rider in Treatment: 5 Encounter Discharge Information Items Discharge Pain Level: 0 Discharge Condition: Stable Ambulatory Status: Walker Discharge Destination: Home Transportation: Private Auto Accompanied By:  self Schedule Follow-up Appointment: Yes Medication Reconciliation completed and provided to Patient/Care Yes Damoney Julia: Provided on Clinical Summary of Care: 03/07/2016 Form Type Recipient Paper Patient DG Electronic Signature(s) Signed: 03/07/2016 2:22:43 PM By: Ruthine Dose Entered By: Ruthine Dose on 03/07/2016 14:22:43 [name] ([id number]) -------------------------------------------------------------------------------- Lower Extremity Assessment Details Patient Name: [name]. Date of Service: 03/07/2016 1:30 PM Medical Record Number: [id number] Patient Account Number: [id number] Date of Birth/Sex: March 13, 1941 (75 y.o. Female) Treating RN: Cornell Barman Primary Care Physician: Glendale Chard Other Clinician: Referring Physician: Glendale Chard Treating Physician/Extender: Frann Rider in Treatment: 5 Edema Assessment Assessed: [Left: No] [Right: No] E[Left: dema] [Right: :] Calf Left: Right: Point of Measurement: 34 cm From Medial Instep 50.5 cm cm Ankle Left: Right: Point of Measurement: 11 cm From Medial Instep 33 cm cm Vascular Assessment Pulses: Posterior Tibial Dorsalis Pedis Palpable: [Left:Yes] Doppler: [Left:Multiphasic] Extremity colors, hair growth, and conditions: Extremity Color: [Left:Red] Hair Growth on Extremity: [Left:No] Temperature of Extremity: [Left:Warm] Capillary Refill: [Left:> 3 seconds] Dependent Rubor: [Left:No] Blanched when Elevated: [Left:No] Lipodermatosclerosis: [Left:No] Toe Nail Assessment Left: Right: Thick: No Discolored: No Deformed: No Improper Length and Hygiene: No Electronic Signature(sSWATI, GRANBERRY ([id number]) Signed: 03/07/2016 3:22:56 PM By: Gretta Cool, RN, BSN, Kim RN, BSN Entered By: Gretta Cool, RN, BSN, Kim on 03/07/2016 14:04:09 [name] ([id number]) -------------------------------------------------------------------------------- Multi Wound Chart Details Patient Name:  [name]. Date of Service: 03/07/2016 1:30 PM Medical Record Number: [id number] Patient Account Number: [id number] Date of Birth/Sex: 1941-03-22 (75 y.o. 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 ([id number]) Wound Margin: Distinct, outline attached N/A N/A Granulation Amount: Medium (34-66%) N/A N/A Granulation Quality: Red, Hyper-granulation N/A N/A Necrotic Amount: Medium (34-66%) N/A N/A Necrotic Tissue: Eschar, Adherent Slough N/A N/A Exposed Structures: Fascia: No N/A N/A Fat: No Tendon: No Muscle: No Joint: No Bone: No Limited to Skin Breakdown Epithelialization: Small (1-33%) N/A N/A Periwound Skin Texture: Edema: Yes N/A N/A Induration: Yes Scarring: Yes Excoriation: No Callus: No Crepitus: No Fluctuance: No Friable: No Rash: No Periwound Skin Moist: Yes N/A  N/A Moisture: Maceration: No Dry/Scaly: No Periwound Skin Color: Ecchymosis: Yes N/A N/A Erythema: Yes Hemosiderin Staining: Yes Atrophie Blanche: No Cyanosis: No Mottled: No Pallor: No Rubor: No Erythema Change: Increased N/A N/A Temperature: No Abnormality N/A N/A Tenderness on Yes N/A N/A Palpation: Wound Preparation: Ulcer Cleansing: N/A N/A Rinsed/Irrigated with Saline Topical Anesthetic Applied: Other: lidocaine 4% Treatment Notes Electronic Signature(s) OTHELIA, RIEDERER ([id number]) Signed: 03/07/2016 4:59:11 PM By: Montey Hora Entered By: Montey Hora on 03/07/2016 14:13:29 [name] ([id number]) -------------------------------------------------------------------------------- Cando Details Patient Name: [name] Date of Service: 03/07/2016 1:30 PM Medical Record Number: [id number] Patient Account Number: [id number] Date of Birth/Sex: 03-Apr-1941 (75 y.o. Female) Treating RN: Montey Hora Primary Care Physician: Glendale Chard Other Clinician: Referring Physician: Glendale Chard Treating Physician/Extender: Frann Rider in Treatment: 5 Active Inactive Abuse / Safety / Falls / Self Care Management Nursing Diagnoses: Impaired physical mobility Potential for falls Goals: Patient will remain injury free Date Initiated: 02/01/2016 Goal Status: Active Interventions: Assess fall risk on admission and as needed Notes: Orientation to the Wound Care Program Nursing Diagnoses: Knowledge deficit related to the wound healing center program Goals: Patient/caregiver will verbalize understanding of the [facility] Date Initiated: 02/01/2016 Goal Status: Active Interventions: Provide education on orientation to the wound center Notes: Wound/Skin Impairment Nursing Diagnoses: Impaired tissue integrity Goals: Patient/caregiver will verbalize understanding of skin care regimen MYLEE, FALIN ([id number]) Date Initiated: 02/01/2016 Goal Status: Active Ulcer/skin breakdown will have a volume reduction of 30% by week 4 Date Initiated: 02/01/2016 Goal Status: Active Ulcer/skin breakdown will have a volume reduction of 50% by week 8 Date Initiated: 02/01/2016 Goal Status: Active Ulcer/skin breakdown will have a volume reduction of 80% by week 12 Date Initiated: 02/01/2016 Goal Status: Active Ulcer/skin breakdown will heal within 14 weeks Date Initiated: 02/01/2016 Goal Status: Active Interventions: Assess patient/caregiver ability to obtain necessary supplies Assess patient/caregiver ability to perform ulcer/skin care regimen upon admission and as needed Assess ulceration(s) every visit Notes: Electronic Signature(s) Signed: 03/07/2016 4:59:11 PM By: Montey Hora Entered By: Montey Hora on 03/07/2016 14:12:02 [name] ([id number]) -------------------------------------------------------------------------------- Pain Assessment Details Patient Name: [name]. Date of Service: 03/07/2016 1:30 PM Medical Record Number: [id number] Patient Account Number: [id number] Date of Birth/Sex: 08-08-1940 (75 y.o. Female) Treating RN: Cornell Barman Primary Care Physician: Glendale Chard Other Clinician: Referring Physician: Glendale Chard Treating Physician/Extender: Frann Rider in Treatment: 5 Active Problems Location of Pain Severity and Description of Pain Patient Has Paino No Site Locations With  Dressing Change: No Pain Management and Medication Current Pain Management: Electronic Signature(s) Signed: 03/07/2016 3:22:56 PM By: Gretta Cool, RN, BSN, Kim RN, BSN Entered By: Gretta Cool, RN, BSN, Kim on 03/07/2016 13:57:57 [name] ([id number]) -------------------------------------------------------------------------------- Patient/Caregiver Education Details Patient Name: [name] Date of Service: 03/07/2016 1:30 PM Medical Record  Number: [id number] Patient Account Number: [id number] Date of Birth/Gender: Nov 24, 1940 (75 y.o. Female) Treating RN: Cornell Barman Primary Care Physician: Glendale Chard Other Clinician: Referring Physician: Glendale Chard Treating Physician/Extender: Frann Rider in Treatment: 5 Education Assessment Education Provided To: Patient Education Topics Provided Wound/Skin Impairment: Handouts: Caring for Your Ulcer, Other: wound care as prescribed Methods: Demonstration, Explain/Verbal Responses: State content correctly Electronic Signature(s) Signed: 03/07/2016 3:22:56 PM By: Gretta Cool, RN, BSN, Kim RN, BSN Entered By: Gretta Cool, RN, BSN, Kim on 03/07/2016 14:21:52 [name] ([id number]) -------------------------------------------------------------------------------- Wound Assessment Details Patient Name: [name]. Date of Service: 03/07/2016 1:30 PM Medical Record Number: [id number] Patient Account Number: [id number] Date of Birth/Sex: 12/29/1940 (75 y.o. Female) Treating RN: Cornell Barman Primary Care Physician: Glendale Chard Other Clinician: Referring Physician: Glendale Chard Treating Physician/Extender: Frann Rider in Treatment: 5 Wound Status Wound Number: 1 Primary Diabetic Wound/Ulcer of the Lower Etiology: Extremity Wound Location: Left Lower Leg - Lateral Secondary Auto-immune Wounding Event: Trauma Etiology: Date Acquired: 12/27/2015 Wound Open Weeks Of Treatment: 5 Status: Clustered Wound: No Comorbid Cataracts, Lymphedema, History: Hypertension, Type II Diabetes, Gout, Rheumatoid Arthritis, Osteoarthritis, Neuropathy Photos Wound Measurements Length: (cm) 5 Width: (cm) 4.5 Depth: (cm) 0.9 Area: (cm) 17.671 Volume: (cm) 15.904 % Reduction in Area: 10.7% % Reduction in Volume: -301.8% Epithelialization: Small (1-33%) Tunneling: No Undermining: No Wound Description Classification: Grade 2 Wound Margin: Distinct, outline  attached Exudate Amount: Medium Exudate Type: Sanguinous Exudate Color: red Wound Bed Granulation Amount: Medium (34-66%) Exposed Structure Granulation Quality: Red, Hyper-granulation Fascia Exposed: No Behney, [name] E. ([id number]) Necrotic Amount: Medium (34-66%) Fat Layer Exposed: No Necrotic Quality: Eschar, Adherent Slough Tendon Exposed: No Muscle Exposed: No Joint Exposed: No Bone Exposed: No Limited to Skin Breakdown Periwound Skin Texture Texture Color No Abnormalities Noted: No No Abnormalities Noted: No Callus: No Atrophie Blanche: No Crepitus: No Cyanosis: No Excoriation: No Ecchymosis: Yes Fluctuance: No Erythema: Yes Friable: No Erythema Change: Increased Induration: Yes Hemosiderin Staining: Yes Localized Edema: Yes Mottled: No Rash: No Pallor: No Scarring: Yes Rubor: No Moisture Temperature / Pain No Abnormalities Noted: No Temperature: No Abnormality Dry / Scaly: No Tenderness on Palpation: Yes Maceration: No Moist: Yes Wound Preparation Ulcer Cleansing: Rinsed/Irrigated with Saline Topical Anesthetic Applied: Other: lidocaine 4%, Treatment Notes Wound #1 (Left, Lateral Lower Leg) 1. Cleansed with: Clean wound with Normal Saline 2. Anesthetic Topical Lidocaine 4% cream to wound bed prior to debridement 4. Dressing Applied: Hydrafera Blue 5. Secondary Dressing Applied Bordered Foam Dressing Electronic Signature(s) Signed: 03/07/2016 3:22:56 PM By: Gretta Cool, RN, BSN, Kim RN, BSN Entered By: Gretta Cool, RN, BSN, Kim on 03/07/2016 14:05:01 [name] ([id number]) -------------------------------------------------------------------------------- Vitals Details Patient Name: [name] Date of Service: 03/07/2016 1:30 PM Medical Record Number: [id number] Patient Account Number: [id number] Date of Birth/Sex: [birth year] (75 y.o. Female) Treating RN: Cornell Barman Primary Care Physician: Glendale Chard Other Clinician: Referring  Physician: Glendale Chard Treating Physician/Extender: Frann Rider in Treatment: 5 Vital Signs Time Taken: 13:57 Temperature (F): 98.1 Pulse (bpm): 108 Respiratory Rate (breaths/min): 16 Blood Pressure (mmHg): 122/64 Reference Range: 80 - 120 mg / dl Electronic Signature(s) Signed: 03/07/2016 3:22:56 PM By: Gretta Cool, RN, BSN, Kim RN, BSN  Entered By: Gretta Cool, RN, BSN, Kim on 03/07/2016 13:58:16

## 2016-03-10 DIAGNOSIS — E1129 Type 2 diabetes mellitus with other diabetic kidney complication: Secondary | ICD-10-CM | POA: Diagnosis not present

## 2016-03-10 DIAGNOSIS — I130 Hypertensive heart and chronic kidney disease with heart failure and stage 1 through stage 4 chronic kidney disease, or unspecified chronic kidney disease: Secondary | ICD-10-CM | POA: Diagnosis not present

## 2016-03-10 DIAGNOSIS — T798XXD Other early complications of trauma, subsequent encounter: Secondary | ICD-10-CM | POA: Diagnosis not present

## 2016-03-10 DIAGNOSIS — S81812D Laceration without foreign body, left lower leg, subsequent encounter: Secondary | ICD-10-CM | POA: Diagnosis not present

## 2016-03-10 DIAGNOSIS — I5032 Chronic diastolic (congestive) heart failure: Secondary | ICD-10-CM | POA: Diagnosis not present

## 2016-03-10 DIAGNOSIS — E1140 Type 2 diabetes mellitus with diabetic neuropathy, unspecified: Secondary | ICD-10-CM | POA: Diagnosis not present

## 2016-03-11 ENCOUNTER — Ambulatory Visit: Payer: Medicare Other | Admitting: Registered Nurse

## 2016-03-11 DIAGNOSIS — T798XXD Other early complications of trauma, subsequent encounter: Secondary | ICD-10-CM | POA: Diagnosis not present

## 2016-03-11 DIAGNOSIS — I5032 Chronic diastolic (congestive) heart failure: Secondary | ICD-10-CM | POA: Diagnosis not present

## 2016-03-11 DIAGNOSIS — S81812D Laceration without foreign body, left lower leg, subsequent encounter: Secondary | ICD-10-CM | POA: Diagnosis not present

## 2016-03-11 DIAGNOSIS — E1140 Type 2 diabetes mellitus with diabetic neuropathy, unspecified: Secondary | ICD-10-CM | POA: Diagnosis not present

## 2016-03-11 DIAGNOSIS — E1129 Type 2 diabetes mellitus with other diabetic kidney complication: Secondary | ICD-10-CM | POA: Diagnosis not present

## 2016-03-11 DIAGNOSIS — I130 Hypertensive heart and chronic kidney disease with heart failure and stage 1 through stage 4 chronic kidney disease, or unspecified chronic kidney disease: Secondary | ICD-10-CM | POA: Diagnosis not present

## 2016-03-11 DIAGNOSIS — I10 Essential (primary) hypertension: Secondary | ICD-10-CM | POA: Diagnosis not present

## 2016-03-12 DIAGNOSIS — E1140 Type 2 diabetes mellitus with diabetic neuropathy, unspecified: Secondary | ICD-10-CM | POA: Diagnosis not present

## 2016-03-12 DIAGNOSIS — S81812D Laceration without foreign body, left lower leg, subsequent encounter: Secondary | ICD-10-CM | POA: Diagnosis not present

## 2016-03-12 DIAGNOSIS — I5032 Chronic diastolic (congestive) heart failure: Secondary | ICD-10-CM | POA: Diagnosis not present

## 2016-03-12 DIAGNOSIS — E1129 Type 2 diabetes mellitus with other diabetic kidney complication: Secondary | ICD-10-CM | POA: Diagnosis not present

## 2016-03-12 DIAGNOSIS — I130 Hypertensive heart and chronic kidney disease with heart failure and stage 1 through stage 4 chronic kidney disease, or unspecified chronic kidney disease: Secondary | ICD-10-CM | POA: Diagnosis not present

## 2016-03-12 DIAGNOSIS — T798XXD Other early complications of trauma, subsequent encounter: Secondary | ICD-10-CM | POA: Diagnosis not present

## 2016-03-14 ENCOUNTER — Encounter: Payer: Medicare Other | Admitting: Surgery

## 2016-03-14 DIAGNOSIS — E11622 Type 2 diabetes mellitus with other skin ulcer: Secondary | ICD-10-CM | POA: Diagnosis not present

## 2016-03-14 DIAGNOSIS — J449 Chronic obstructive pulmonary disease, unspecified: Secondary | ICD-10-CM | POA: Diagnosis not present

## 2016-03-14 DIAGNOSIS — E1122 Type 2 diabetes mellitus with diabetic chronic kidney disease: Secondary | ICD-10-CM | POA: Diagnosis not present

## 2016-03-14 DIAGNOSIS — I890 Lymphedema, not elsewhere classified: Secondary | ICD-10-CM | POA: Diagnosis not present

## 2016-03-14 DIAGNOSIS — N189 Chronic kidney disease, unspecified: Secondary | ICD-10-CM | POA: Diagnosis not present

## 2016-03-14 DIAGNOSIS — L97822 Non-pressure chronic ulcer of other part of left lower leg with fat layer exposed: Secondary | ICD-10-CM | POA: Diagnosis not present

## 2016-03-14 DIAGNOSIS — L97222 Non-pressure chronic ulcer of left calf with fat layer exposed: Secondary | ICD-10-CM | POA: Diagnosis not present

## 2016-03-17 ENCOUNTER — Telehealth: Payer: Self-pay | Admitting: Internal Medicine

## 2016-03-17 DIAGNOSIS — T798XXD Other early complications of trauma, subsequent encounter: Secondary | ICD-10-CM | POA: Diagnosis not present

## 2016-03-17 DIAGNOSIS — E1129 Type 2 diabetes mellitus with other diabetic kidney complication: Secondary | ICD-10-CM | POA: Diagnosis not present

## 2016-03-17 DIAGNOSIS — S81812D Laceration without foreign body, left lower leg, subsequent encounter: Secondary | ICD-10-CM | POA: Diagnosis not present

## 2016-03-17 DIAGNOSIS — E1140 Type 2 diabetes mellitus with diabetic neuropathy, unspecified: Secondary | ICD-10-CM | POA: Diagnosis not present

## 2016-03-17 DIAGNOSIS — I130 Hypertensive heart and chronic kidney disease with heart failure and stage 1 through stage 4 chronic kidney disease, or unspecified chronic kidney disease: Secondary | ICD-10-CM | POA: Diagnosis not present

## 2016-03-17 DIAGNOSIS — I5032 Chronic diastolic (congestive) heart failure: Secondary | ICD-10-CM | POA: Diagnosis not present

## 2016-03-17 NOTE — Progress Notes (Signed)
[name], [name] ([id number]) Visit Report for 03/14/2016 Arrival Information Details Patient Name: [name], [name]. Date of Service: 03/14/2016 2:15 PM Medical Record Number: [id number] Patient Account Number: [id number] Date of Birth/Sex: 01-02-1941 (75 y.o. Female) Treating RN: Cornell Barman Primary Care Physician: Glendale Chard Other Clinician: Referring Physician: Glendale Chard Treating Physician/Extender: Frann Rider in Treatment: 6 Visit Information History Since Last Visit Added or deleted any medications: No Patient Arrived: Walker Any new allergies or adverse reactions: No Arrival Time: 13:58 Had a fall or experienced change in No Accompanied By: daughter activities of daily living that may affect Transfer Assistance: None risk of falls: Patient Identification Verified: Yes Signs or symptoms of abuse/neglect since last No Secondary Verification Process Yes visito Completed: Hospitalized since last visit: No Patient Has Alerts: Yes Has Dressing in Place as Prescribed: Yes Patient Alerts: DM II Pain Present Now: No Electronic Signature(s) Signed: 03/17/2016 3:33:39 PM By: Gretta Cool, RN, BSN, Kim RN, BSN Entered By: Gretta Cool, RN, BSN, Kim on 03/14/2016 14:09:31 [name] ([id number]) -------------------------------------------------------------------------------- Encounter Discharge Information Details Patient Name: [name]. Date of Service: 03/14/2016 2:15 PM Medical Record Number: [id number] Patient Account Number: [id number] Date of Birth/Sex: Oct 17, 1940 (75 y.o. Female) Treating RN: Cornell Barman Primary Care Physician: Glendale Chard Other Clinician: Referring Physician: Glendale Chard Treating Physician/Extender: Frann Rider in Treatment: 6 Encounter Discharge Information Items Discharge Pain Level: 0 Discharge Condition: Stable Ambulatory Status: Walker Discharge Destination: Home Transportation: Private Auto Accompanied  By: daughter Schedule Follow-up Appointment: Yes Medication Reconciliation completed and provided to Patient/Care Yes [name]: Provided on Clinical Summary of Care: 03/14/2016 Form Type Recipient Paper Patient DG Electronic Signature(s) Signed: 03/14/2016 2:37:54 PM By: Ruthine Dose Entered By: Ruthine Dose on 03/14/2016 14:37:54 [name], [name] ([id number]) -------------------------------------------------------------------------------- Lower Extremity Assessment Details Patient Name: [name]. Date of Service: 03/14/2016 2:15 PM Medical Record Number: [id number] Patient Account Number: [id number] Date of Birth/Sex: 02/17/1941 (75 y.o. Female) Treating RN: Cornell Barman Primary Care Physician: Glendale Chard Other Clinician: Referring Physician: Glendale Chard Treating Physician/Extender: Frann Rider in Treatment: 6 Edema Assessment Assessed: [Left: No] [Right: No] E[Left: dema] [Right: :] Calf Left: Right: Point of Measurement: 34 cm From Medial Instep 47 cm cm Ankle Left: Right: Point of Measurement: 11 cm From Medial Instep 30 cm cm Vascular Assessment Pulses: Posterior Tibial Dorsalis Pedis Palpable: [Left:Yes] Extremity colors, hair growth, and conditions: Extremity Color: [Left:Mottled] Hair Growth on Extremity: [Left:No] Temperature of Extremity: [Left:Warm] Capillary Refill: [Left:< 3 seconds] Dependent Rubor: [Left:No] Blanched when Elevated: [Left:No] Lipodermatosclerosis: [Left:No] Toe Nail Assessment Left: Right: Thick: No Discolored: No Deformed: No Improper Length and Hygiene: No Electronic Signature(s) Signed: 03/17/2016 3:33:39 PM By: Gretta Cool, RN, BSN, Kim RN, BSN [address] [place] ([id number]) Entered By: Gretta Cool, RN, BSN, Kim on 03/14/2016 14:14:54 [name] ([id number]) -------------------------------------------------------------------------------- Multi Wound Chart Details Patient Name: [name]. Date of Service: 03/14/2016 2:15 PM Medical Record Number: [id number] Patient Account Number: [id number] Date of Birth/Sex: 11/19/1940 (75 y.o. 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] [N/A:N/A] Comorbid History: [1:Cataracts, Lymphedema, N/A Hypertension, Type II Diabetes, Gout, Rheumatoid Arthritis, Osteoarthritis, Neuropathy] Date Acquired: [  1:12/27/2015] [N/A:N/A] Weeks of Treatment: [1:6] [N/A:N/A] Wound Status: [1:Open] [N/A:N/A] Measurements L x W x D 4x4x0.6 [N/A:N/A] (cm) Area (cm) : [1:12.566] [N/A:N/A] Volume (cm) : [1:7.54] [N/A:N/A] % Reduction in Area: [1:36.50%] [N/A:N/A] % Reduction in Volume: -90.50% [N/A:N/A] Classification: [1:Grade 2] [N/A:N/A] Exudate Amount: [1:Medium] [N/A:N/A] Exudate Type: [1:Sanguinous] [N/A:N/A] Exudate Color: [1:red] [N/A:N/A] Wound Margin: [1:Distinct, outline attached N/A] Granulation Amount: [1:Large (67-100%)] [N/A:N/A] Granulation Quality: [1:Red, Hyper-granulation] [N/A:N/A] Necrotic Amount: [1:Small (1-33%)] [N/A:N/A] Exposed Structures: [1:Fascia: No Fat: No] [N/A:N/A] Tendon: No Muscle: No Joint: No Bone: No Limited to Skin Breakdown Epithelialization: Medium (34-66%) N/A N/A Periwound Skin Texture: Edema: Yes N/A N/A Scarring: Yes Excoriation: No Induration: No Callus: No Crepitus: No Fluctuance: No Friable: No Rash: No Periwound Skin Moist: Yes N/A  N/A Moisture: Maceration: No Dry/Scaly: No Periwound Skin Color: Ecchymosis: Yes N/A N/A Hemosiderin Staining: Yes Atrophie Blanche: No Cyanosis: No Erythema: No Mottled: No Pallor: No Rubor: No Temperature: No Abnormality N/A N/A Tenderness on Yes N/A N/A Palpation: Wound Preparation: Ulcer Cleansing: N/A N/A Rinsed/Irrigated with Saline Topical Anesthetic Applied: Other: lidocaine 4% Treatment Notes Electronic Signature(s) Signed: 03/17/2016 3:33:39 PM By: Gretta Cool, RN, BSN, Kim RN, BSN Entered By: Gretta Cool, RN, BSN, Kim on 03/14/2016 14:25:07 [name] ([id number]) -------------------------------------------------------------------------------- [place] Details Patient Name: [name], [name]. Date of Service: 03/14/2016 2:15 PM Medical Record Number: [id number] Patient Account Number: [id number] Date of Birth/Sex: [birth year] (75 y.o. Female) Treating RN: Cornell Barman Primary Care Physician: Glendale Chard Other Clinician: Referring Physician: Glendale Chard Treating Physician/Extender: Frann Rider in Treatment: 6 Active Inactive Abuse / Safety / Falls / Self Care Management Nursing Diagnoses: Impaired physical mobility Potential for falls Goals: Patient will remain injury free Date Initiated: 02/01/2016 Goal Status: Active Interventions: Assess fall risk on admission and as needed Notes: Orientation to the Wound Care Program Nursing Diagnoses: Knowledge deficit related to the wound healing center program Goals: Patient/caregiver will verbalize understanding of the [place] Program Date Initiated: 02/01/2016 Goal Status: Active Interventions: Provide education on orientation to the wound center Notes: Wound/Skin Impairment Nursing Diagnoses: Impaired tissue integrity Goals: Patient/caregiver will verbalize understanding of skin care regimen [name], [name] ([id number]) Date Initiated: 02/01/2016 Goal  Status: Active Ulcer/skin breakdown will have a volume reduction of 30% by week 4 Date Initiated: 02/01/2016 Goal Status: Active Ulcer/skin breakdown will have a volume reduction of 50% by week 8 Date Initiated: 02/01/2016 Goal Status: Active Ulcer/skin breakdown will have a volume reduction of 80% by week 12 Date Initiated: 02/01/2016 Goal Status: Active Ulcer/skin breakdown will heal within 14 weeks Date Initiated: 02/01/2016 Goal Status: Active Interventions: Assess patient/caregiver ability to obtain necessary supplies Assess patient/caregiver ability to perform ulcer/skin care regimen upon admission and as needed Assess ulceration(s) every visit Notes: Electronic Signature(s) Signed: 03/17/2016 3:33:39 PM By: Gretta Cool, RN, BSN, Kim RN, BSN Entered By: Gretta Cool, RN, BSN, Kim on 03/14/2016 14:24:49 [name] ([id number]) -------------------------------------------------------------------------------- Pain Assessment Details Patient Name: [name]. Date of Service: 03/14/2016 2:15 PM Medical Record Number: [id number] Patient Account Number: [id number] Date of Birth/Sex: Feb 25, 1941 (75 y.o. Female) Treating RN: Cornell Barman Primary Care Physician: Glendale Chard Other Clinician: Referring Physician: Glendale Chard Treating Physician/Extender: Frann Rider in Treatment: 6 Active Problems Location of Pain Severity and Description of Pain Patient Has Paino Yes Site Locations Pain Location: Generalized Pain With Dressing Change: No Rate the pain. Current Pain Level: 8 Worst Pain Level: 9 Character of Pain Describe the Pain: Aching Pain Management and Medication Current  Pain Management: Medication: Yes Goals for Pain Management patient currently has bad headache Electronic Signature(s) Signed: 03/17/2016 3:33:39 PM By: Gretta Cool, RN, BSN, Kim RN, BSN Entered By: Gretta Cool, RN, BSN, Kim on 03/14/2016 14:10:05 [name]  ([id number]) -------------------------------------------------------------------------------- Patient/Caregiver Education Details Patient Name: [name] Date of Service: 03/14/2016 2:15 PM Medical Record Number: [id number] Patient Account Number: [id number] Date of Birth/Gender: February 10, 1941 (75 y.o. Female) Treating RN: Cornell Barman Primary Care Physician: Glendale Chard Other Clinician: Referring Physician: Glendale Chard Treating Physician/Extender: Frann Rider in Treatment: 6 Education Assessment Education Provided To: Patient Education Topics Provided Wound/Skin Impairment: Handouts: Caring for Your Ulcer Methods: Demonstration, Explain/Verbal Responses: State content correctly Electronic Signature(s) Signed: 03/17/2016 3:33:39 PM By: Gretta Cool, RN, BSN, Kim RN, BSN Entered By: Gretta Cool, RN, BSN, Kim on 03/14/2016 14:28:52 [name] ([id number]) -------------------------------------------------------------------------------- Wound Assessment Details Patient Name: [name]. Date of Service: 03/14/2016 2:15 PM Medical Record Number: [id number] Patient Account Number: [id number] Date of Birth/Sex: [birth year] (75 y.o. Female) Treating RN: Cornell Barman Primary Care Physician: Glendale Chard Other Clinician: Referring Physician: Glendale Chard Treating Physician/Extender: Frann Rider in Treatment: 6 Wound Status Wound Number: 1 Primary Diabetic Wound/Ulcer of the Lower Etiology: Extremity Wound Location: Left Lower Leg - Lateral Secondary Auto-immune Wounding Event: Trauma Etiology: Date Acquired: 12/27/2015 Wound Open Weeks Of Treatment: 6 Status: Clustered Wound: No Comorbid Cataracts, Lymphedema, History: Hypertension, Type II Diabetes, Gout, Rheumatoid Arthritis, Osteoarthritis, Neuropathy Wound Measurements Length: (cm) 4 Width: (cm) 4 Depth: (cm) 0.6 Area: (cm) 12.566 Volume: (cm) 7.54 % Reduction in Area: 36.5% %  Reduction in Volume: -90.5% Epithelialization: Medium (34-66%) Tunneling: No Undermining: No Wound Description Classification: Grade 2 Wound Margin: Distinct, outline attached Exudate Amount: Medium Exudate Type: Sanguinous Exudate Color: red Wound Bed Granulation Amount: Large (67-100%) Exposed Structure Granulation Quality: Red, Hyper-granulation Fascia Exposed: No Necrotic Amount: Small (1-33%) Fat Layer Exposed: No Necrotic Quality: Adherent Slough Tendon Exposed: No Muscle Exposed: No Joint Exposed: No Bone Exposed: No Limited to Skin Breakdown Periwound Skin Texture Texture Color No Abnormalities Noted: No No Abnormalities Noted: No Mcdaniel, [name] ([id number]) Callus: No Atrophie Blanche: No Crepitus: No Cyanosis: No Excoriation: No Ecchymosis: Yes Fluctuance: No Erythema: No Friable: No Hemosiderin Staining: Yes Induration: No Mottled: No Localized Edema: Yes Pallor: No Rash: No Rubor: No Scarring: Yes Temperature / Pain Moisture Temperature: No Abnormality No Abnormalities Noted: No Tenderness on Palpation: Yes Dry / Scaly: No Maceration: No Moist: Yes Wound Preparation Ulcer Cleansing: Rinsed/Irrigated with Saline Topical Anesthetic Applied: Other: lidocaine 4%, Treatment Notes Wound #1 (Left, Lateral Lower Leg) 1. Cleansed with: Clean wound with Normal Saline 2. Anesthetic Topical Lidocaine 4% cream to wound bed prior to debridement 3. Peri-wound Care: Skin Prep 4. Dressing Applied: Hydrafera Blue 5. Secondary Dressing Applied Bordered Foam Dressing Dry Gauze 7. Secured with Patient to wear own compression stockings Electronic Signature(s) Signed: 03/17/2016 3:33:39 PM By: Gretta Cool, RN, BSN, Kim RN, BSN Entered By: Gretta Cool, RN, BSN, Kim on 03/14/2016 14:16:53 [name] ([id number]) -------------------------------------------------------------------------------- [place] Details Patient Name: [name] Date of  Service: 03/14/2016 2:15 PM Medical Record Number: [id number] Patient Account Number: [id number] Date of Birth/Sex: 18-Sep-1940 (75 y.o. Female) Treating RN: Cornell Barman Primary Care Physician: Glendale Chard Other Clinician: Referring Physician: Glendale Chard Treating Physician/Extender: Frann Rider in Treatment: 6 Vital Signs Time Taken: 14:10 Temperature (F): 98.0 Pulse (bpm): 95 Respiratory Rate (breaths/min): 16 Blood Pressure (mmHg): 113/67 Reference Range: 80 - 120 mg / dl Electronic Signature(s) Signed: 03/17/2016  3:33:39 PM By: Gretta Cool, RN, BSN, Kim RN, BSN Entered By: Gretta Cool, RN, BSN, Kim on 03/14/2016 14:10:32

## 2016-03-17 NOTE — Telephone Encounter (Signed)
Pt c/o prod cough with white mucus, sinus congestion, PND.  Denies fever, chills, vomiting S/s present X3 days.  Has been taking mucinex and flonase- has not helped s/s.  Pt uses [facility] on [address].    Sending to DOD as MR is unavailable today.  RB please advise on recs.  Thanks!

## 2016-03-17 NOTE — Telephone Encounter (Signed)
Spoke with pt, aware of recs. Scheduled to see MR tomorrow morning.  Nothing further needed.

## 2016-03-17 NOTE — Progress Notes (Signed)
SKYLEY, GRANDMAISON ([id number]) Visit Report for 03/14/2016 Chief Complaint Document Details Patient Name: [name], [name] 03/14/2016 2:15 Date of Service: PM Medical Record [id number] Number: Patient Account Number: [id number] Oct 15, 1940 (75 y.o. Treating RN: Montey Hora Date of Birth/Sex: Female) Other Clinician: Primary Care Physician: Glendale Chard Treating Christin Fudge Referring Physician: Glendale Chard Physician/Extender: Suella Grove in Treatment: 6 Information Obtained from: Patient Chief Complaint Patients presents for treatment of an open diabetic ulcer the left lower extremity which she's had for about 4 weeks Electronic Signature(s) Signed: 03/14/2016 2:33:35 PM By: Christin Fudge MD, FACS Entered By: Christin Fudge on 03/14/2016 14:33:34 [name] ([id number]) -------------------------------------------------------------------------------- Debridement Details Patient Name: [name] 03/14/2016 2:15 Date of Service: PM Medical Record [id number] Number: Patient Account Number: [id number] Sep 22, 1940 (75 y.o. Treating RN: Montey Hora Date of Birth/Sex: Female) Other Clinician: Primary Care Physician: Glendale Chard Treating Palmira Stickle Referring Physician: Glendale Chard Physician/Extender: Suella Grove in Treatment: 6 Debridement Performed for Wound #1 Left,Lateral Lower Leg Assessment: Performed By: Physician Christin Fudge, MD Debridement: Debridement Pre-procedure Yes - 14:25 Verification/Time Out Taken: Start Time: 14:25 Pain Control: Other : LIDOCAINE 4% Level: Skin/Subcutaneous Tissue Total Area Debrided (L x 4 (cm) x 4 (cm) = 16 (cm) W): Tissue and other Viable, Non-Viable, Exudate, Fat, Fibrin/Slough, Subcutaneous material debrided: Instrument: Curette Bleeding: Minimum Hemostasis Achieved: Pressure End Time: 14:26 Procedural Pain: 0 Post Procedural Pain: 0 Response to Treatment: Procedure was tolerated well Post  Debridement Measurements of Total Wound Length: (cm) 4 Width: (cm) 4 Depth: (cm) 0.6 Volume: (cm) 7.54 Character of Wound/Ulcer Post Requires Further Debridement Debridement: Severity of Tissue Post Debridement: Fat layer exposed Post Procedure Diagnosis Same as Pre-procedure Electronic Signature(s) Signed: 03/14/2016 2:33:26 PM By: Christin Fudge MD, FACS Signed: 03/14/2016 5:04:12 PM By: Durwin Nora ([id number]) Entered By: Christin Fudge on 03/14/2016 14:33:26 [name] ([id number]) -------------------------------------------------------------------------------- HPI Details Patient Name: [name] 03/14/2016 2:15 Date of Service: PM Medical Record [id number] Number: Patient Account Number: [id number] November 22, 1940 (75 y.o. Treating RN: Montey Hora Date of Birth/Sex: Female) Other Clinician: Primary Care Physician: Glendale Chard Treating Zayd Bonet Referring Physician: Glendale Chard Physician/Extender: Suella Grove in Treatment: 6 History of Present Illness Location: left lower extremity lateral calf Quality: Patient reports experiencing a dull pain to affected area(s). Severity: Patient states wound are getting worse. Duration: Patient has had the wound for < 4 weeks prior to presenting for treatment Timing: Pain in wound is Intermittent (comes and goes Context: The wound occurred when the patient was injured with a sharp corner for car door Modifying Factors: Other treatment(s) tried include:local care and 3 courses of antibiotics including Keflex, Bactrim and doxycycline Associated Signs and Symptoms: Patient reports having increase swelling. HPI Description: 75 year old patient with a past medical history of diabetes mellitus, chronic kidney disease, COPD, hypertension, rheumatoid arthritis had a lacerated wound to her left calf when she injured it on the corner of a car door and it was a long 12 cm laceration. x-ray done at the  time revealed that there was no fracture or radiopaque foreign body. the wound was sutured with 4-0 Prolene simple interrupted sutures and a total of 14 sutures were applied. on August 31 she was seen in the ER and it was noted that the wound had dehisced and she was treated with Keflex. She has completed a course of doxycycline recently Medical history also significant for status post abdominal hysterectomy, brain surgery, cardiac catheterization, EGD and bronchoscopy. of late she was back in the  ER and was seen last on August 31 and they tried Silvadene 02/22/16: pt reports discomfort and mild pain just below her wound. however, she admits on legs more than usual. no redness, foul odor or purulence. the wound is measuring slightly larger today, but likely secondary to poor edema control. nursing staff gave information to pt today regarding purchase of her compression stockings. she was encouraged to purchase soon. 02/29/16: returns today for f/u. she denies systemic s/s of infection. she brought her new compression stockings today. Electronic Signature(s) Signed: 03/14/2016 2:33:38 PM By: Christin Fudge MD, FACS Entered By: Christin Fudge on 03/14/2016 14:33:38 [name] ([id number]) -------------------------------------------------------------------------------- Physical Exam Details Patient Name: [name] 03/14/2016 2:15 Date of Service: PM Medical Record [id number] Number: Patient Account Number: [id number] 28-Sep-1940 (75 y.o. Treating RN: Montey Hora Date of Birth/Sex: Female) Other Clinician: Primary Care Physician: Glendale Chard Treating Maybree Riling Referring Physician: Glendale Chard Physician/Extender: Weeks in Treatment: 6 Constitutional . Pulse regular. Respirations normal and unlabored. Afebrile. . Eyes Nonicteric. Reactive to light. Ears, Nose, Mouth, and Throat Lips, teeth, and gums WNL.[name] Moist mucosa without lesions. Neck supple and  nontender. No palpable supraclavicular or cervical adenopathy. Normal sized without goiter. Respiratory WNL. No retractions.. Breath sounds WNL, No rubs, rales, rhonchi, or wheeze.. Cardiovascular Heart rhythm and rate regular, no murmur or Mcdaniel.. Pedal Pulses WNL. No clubbing, cyanosis or edema. Chest Breasts symmetical and no nipple discharge.. Breast tissue WNL, no masses, lumps, or tenderness.. Gastrointestinal (GI) Abdomen without masses or tenderness.. No liver or spleen enlargement or tenderness.. Genitourinary (GU) No hydrocele, spermatocele, tenderness of the cord, or testicular mass.[name] Penis without lesions.Lowella Fairy without lesions. No cystocele, or rectocele. Pelvic support intact, no discharge.[name] Urethra without masses, tenderness or scarring.[name] Lymphatic No adneopathy. No adenopathy. No adenopathy. Musculoskeletal Adexa without tenderness or enlargement.. Digits and nails w/o clubbing, cyanosis, infection, petechiae, ischemia, or inflammatory conditions.. Integumentary (Hair, Skin) No suspicious lesions. No crepitus or fluctuance. No peri-wound warmth or erythema. No masses.[name] Psychiatric Judgement and insight Intact.. No evidence of depression, anxiety, or agitation.. Notes the patient's wound continues to look better and she has areas with necrotic debris which are sharply removed with a #3 curet. Minimal bleeding controlled with pressure. CHASTA, DESHPANDE ([id number]) Electronic Signature(s) Signed: 03/14/2016 2:38:15 PM By: Christin Fudge MD, FACS Entered By: Christin Fudge on 03/14/2016 14:38:15 [name] ([id number]) -------------------------------------------------------------------------------- Physician Orders Details Patient Name: TRENISE, TURAY 03/14/2016 2:15 Date of Service: PM Medical Record [id number] Number: Patient Account Number: [id number] Dec 31, 1940 (75 y.o. Treating RN: Cornell Barman Date of Birth/Sex: Female) Other  Clinician: Primary Care Physician: Glendale Chard Treating Draden Cottingham Referring Physician: Glendale Chard Physician/Extender: Suella Grove in Treatment: 6 Verbal / Phone Orders: Yes Clinician: Cornell Barman Read Back and Verified: Yes Diagnosis Coding Wound Cleansing Wound #1 Left,Lateral Lower Leg o Clean wound with Normal Saline. o May Shower, gently pat wound dry prior to applying new dressing. Anesthetic Wound #1 Left,Lateral Lower Leg o Topical Lidocaine 4% cream applied to wound bed prior to debridement Skin Barriers/Peri-Wound Care Wound #1 Left,Lateral Lower Leg o Skin Prep Primary Wound Dressing Wound #1 Left,Lateral Lower Leg o Hydrafera Blue Secondary Dressing Wound #1 Left,Lateral Lower Leg o Dry Gauze o Boardered Foam Dressing Dressing Change Frequency Wound #1 Left,Lateral Lower Leg o Change dressing every day. Follow-up Appointments Wound #1 Left,Lateral Lower Leg o Return Appointment in 1 week. Edema Control Wound #1 Left,Lateral Lower Leg o Patient to wear own compression stockings Koke, Niurka E. ([id number])  Home Health Wound #1 [place] Visits - [place] Go [facility] to visit patient 2 times weekly, family to change dressing all other days o Home Health Nurse may visit PRN to address patientos wound care needs. o FACE TO FACE ENCOUNTER: MEDICARE and MEDICAID PATIENTS: I certify that this patient is under my care and that I had a face-to-face encounter that meets the physician face-to-face encounter requirements with this patient on this date. The encounter with the patient was in whole or in part for the following MEDICAL CONDITION: (primary reason for [place]) MEDICAL NECESSITY: I certify, that based on my findings, NURSING services are a medically necessary home health service. HOME BOUND STATUS: I certify that my clinical findings support that this patient is homebound (i.e., Due to  illness or injury, pt requires aid of supportive devices such as crutches, cane, wheelchairs, walkers, the use of special transportation or the assistance of another person to leave their place of residence. There is a normal inability to leave the home and doing so requires considerable and taxing effort. Other absences are for medical reasons / religious services and are infrequent or of short duration when for other reasons). o If current dressing causes regression in wound condition, may D/C ordered dressing product/s and apply Normal Saline Moist Dressing daily until next [place] / Other MD appointment. [place] of regression in wound condition at [phone]. o Please direct any NON-WOUND related issues/requests for orders to patient's Primary Care Physician Electronic Signature(s) Signed: 03/14/2016 4:11:36 PM By: Christin Fudge MD, FACS Signed: 03/17/2016 3:33:39 PM By: Gretta Cool RN, BSN, Kim RN, BSN Entered By: Gretta Cool, RN, BSN, Kim on 03/14/2016 14:27:33 [name] ([id number]) -------------------------------------------------------------------------------- Problem List Details Patient Name: REFUGIA, LANEVE 03/14/2016 2:15 Date of Service: PM Medical Record [id number] Number: Patient Account Number: [id number] 19-Feb-1941 (75 y.o. Treating RN: Montey Hora Date of Birth/Sex: Female) Other Clinician: Primary Care Physician: Glendale Chard Treating Christin Fudge Referring Physician: Glendale Chard Physician/Extender: Weeks in Treatment: 6 Active Problems ICD-10 Encounter Code Description Active Date Diagnosis E11.622 Type 2 diabetes mellitus with other skin ulcer 02/01/2016 Yes L97.222 Non-pressure chronic ulcer of left calf with fat layer 02/01/2016 Yes exposed I89.0 Lymphedema, not elsewhere classified 02/01/2016 Yes Inactive Problems Resolved Problems Electronic Signature(s) Signed: 03/14/2016 2:33:17 PM By: Christin Fudge  MD, FACS Entered By: Christin Fudge on 03/14/2016 14:33:16 [name] ([id number]) -------------------------------------------------------------------------------- Progress Note Details Patient Name: [name] 03/14/2016 2:15 Date of Service: PM Medical Record [id number] Number: Patient Account Number: [id number] [birth year] (75 y.o. Treating RN: Montey Hora Date of Birth/Sex: Female) Other Clinician: Primary Care Physician: Glendale Chard Treating Tiaja Hagan Referring Physician: Glendale Chard Physician/Extender: Suella Grove in Treatment: 6 Subjective Chief Complaint Information obtained from Patient Patients presents for treatment of an open diabetic ulcer the left lower extremity which she's had for about 4 weeks History of Present Illness (HPI) The following HPI elements were documented for the patient's wound: Location: left lower extremity lateral calf Quality: Patient reports experiencing a dull pain to affected area(s). Severity: Patient states wound are getting worse. Duration: Patient has had the wound for < 4 weeks prior to presenting for treatment Timing: Pain in wound is Intermittent (comes and goes Context: The wound occurred when the patient was injured with a sharp corner for car door Modifying Factors: Other treatment(s) tried include:local care and 3 courses of antibiotics including Keflex, Bactrim and doxycycline Associated Signs and Symptoms: Patient reports having increase  swelling. 74 year old patient with a past medical history of diabetes mellitus, chronic kidney disease, COPD, hypertension, rheumatoid arthritis had a lacerated wound to her left calf when she injured it on the corner of a car door and it was a long 12 cm laceration. x-ray done at the time revealed that there was no fracture or radiopaque foreign body. the wound was sutured with 4-0 Prolene simple interrupted sutures and a total of 14 sutures were applied. on August 31 she  was seen in the ER and it was noted that the wound had dehisced and she was treated with Keflex. She has completed a course of doxycycline recently Medical history also significant for status post abdominal hysterectomy, brain surgery, cardiac catheterization, EGD and bronchoscopy. of late she was back in the ER and was seen last on August 31 and they tried Silvadene 02/22/16: pt reports discomfort and mild pain just below her wound. however, she admits on legs more than usual. no redness, foul odor or purulence. the wound is measuring slightly larger today, but likely secondary to poor edema control. nursing staff gave information to pt today regarding purchase of her compression stockings. she was encouraged to purchase soon. 02/29/16: returns today for f/u. she denies systemic s/s of infection. she brought her new compression stockings today. ROMY, IPOCK ([id number]) Objective Constitutional Pulse regular. Respirations normal and unlabored. Afebrile. Vitals Time Taken: 2:10 PM, Temperature: 98.0 F, Pulse: 95 bpm, Respiratory Rate: 16 breaths/min, Blood Pressure: 113/67 mmHg. Eyes Nonicteric. Reactive to light. Ears, Nose, Mouth, and Throat Lips, teeth, and gums WNL.[name] Moist mucosa without lesions. Neck supple and nontender. No palpable supraclavicular or cervical adenopathy. Normal sized without goiter. Respiratory WNL. No retractions.. Breath sounds WNL, No rubs, rales, rhonchi, or wheeze.. Cardiovascular Heart rhythm and rate regular, no murmur or Mcdaniel.. Pedal Pulses WNL. No clubbing, cyanosis or edema. Chest Breasts symmetical and no nipple discharge.. Breast tissue WNL, no masses, lumps, or tenderness.. Gastrointestinal (GI) Abdomen without masses or tenderness.. No liver or spleen enlargement or tenderness.. Genitourinary (GU) No hydrocele, spermatocele, tenderness of the cord, or testicular mass.[name] Penis without lesions.Lowella Fairy without lesions. No cystocele, or  rectocele. Pelvic support intact, no discharge.[name] Urethra without masses, tenderness or scarring.[name] Lymphatic No adneopathy. No adenopathy. No adenopathy. Musculoskeletal Adexa without tenderness or enlargement.. Digits and nails w/o clubbing, cyanosis, infection, petechiae, ischemia, or inflammatory conditions.[name] Psychiatric Judgement and insight Intact.. No evidence of depression, anxiety, or agitation.. General Notes: the patient's wound continues to look better and she has areas with necrotic debris which Bennett, Carrington E. ([id number]) are sharply removed with a #3 curet. Minimal bleeding controlled with pressure. Integumentary (Hair, Skin) No suspicious lesions. No crepitus or fluctuance. No peri-wound warmth or erythema. No masses.. Wound #1 status is Open. Original cause of wound was Trauma. The wound is located on the Left,Lateral Lower Leg. The wound measures 4cm length x 4cm width x 0.6cm depth; 12.566cm^2 area and 7.54cm^3 volume. The wound is limited to skin breakdown. There is no tunneling or undermining noted. There is a medium amount of sanguinous drainage noted. The wound margin is distinct with the outline attached to the wound base. There is large (67-100%) red granulation within the wound bed. There is a small (1-33%) amount of necrotic tissue within the wound bed including Adherent Slough. The periwound skin appearance exhibited: Localized Edema, Scarring, Moist, Ecchymosis, Hemosiderin Staining. The periwound skin appearance did not exhibit: Callus, Crepitus, Excoriation, Fluctuance, Friable, Induration, Rash, Dry/Scaly, Maceration, Atrophie Blanche, Cyanosis, Mottled, Pallor, Rubor,  Erythema. Periwound temperature was noted as No Abnormality. The periwound has tenderness on palpation. Assessment Active Problems ICD-10 E11.622 - Type 2 diabetes mellitus with other skin ulcer L97.222 - Non-pressure chronic ulcer of left calf with fat layer exposed I89.0 - Lymphedema,  not elsewhere classified Procedures Wound #1 Wound #1 is a Diabetic Wound/Ulcer of the Lower Extremity located on the Left,Lateral Lower Leg . There was a Skin/Subcutaneous Tissue Debridement ([id number]) debridement with total area of 16 sq cm performed by Christin Fudge, MD. with the following instrument(s): Curette to remove Viable and Non-Viable tissue/material including Exudate, Fat, Fibrin/Slough, and Subcutaneous after achieving pain control using Other (LIDOCAINE 4%). A time out was conducted at 14:25, prior to the start of the procedure. A Minimum amount of bleeding was controlled with Pressure. The procedure was tolerated well with a pain level of 0 throughout and a pain level of 0 following the procedure. Post Debridement Measurements: 4cm length x 4cm width x 0.6cm depth; 7.54cm^3 volume. Character of Wound/Ulcer Post Debridement requires further debridement. Severity of Tissue Post Debridement is: Fat layer exposed. Post procedure Diagnosis Wound #1: Same as Pre-Procedure ATHANASIA, STANWOOD. ([id number]) Plan Wound Cleansing: Wound #1 Left,Lateral Lower Leg: Clean wound with Normal Saline. May Shower, gently pat wound dry prior to applying new dressing. Anesthetic: Wound #1 Left,Lateral Lower Leg: Topical Lidocaine 4% cream applied to wound bed prior to debridement Skin Barriers/Peri-Wound Care: Wound #1 Left,Lateral Lower Leg: Skin Prep Primary Wound Dressing: Wound #1 Left,Lateral Lower Leg: Hydrafera Blue Secondary Dressing: Wound #1 Left,Lateral Lower Leg: Dry Gauze Boardered Foam Dressing Dressing Change Frequency: Wound #1 Left,Lateral Lower Leg: Change dressing every day. Follow-up Appointments: Wound #1 Left,Lateral Lower Leg: Return Appointment in 1 week. Edema Control: Wound #1 Left,Lateral Lower Leg: Patient to wear own compression stockings Home Health: Wound #1 Left,Lateral Lower Leg: Continue Home Health Visits - [place] Go [facility] to visit patient 2  times weekly, family to change dressing all other days Home Health Nurse may visit PRN to address patient s wound care needs. FACE TO FACE ENCOUNTER: MEDICARE and MEDICAID PATIENTS: I certify that this patient is under my care and that I had a face-to-face encounter that meets the physician face-to-face encounter requirements with this patient on this date. The encounter with the patient was in whole or in part for the following MEDICAL CONDITION: (primary reason for [place]) MEDICAL NECESSITY: I certify, that based on my findings, NURSING services are a medically necessary home health service. HOME BOUND STATUS: I certify that my clinical findings support that this patient is homebound (i.e., Due to illness or injury, pt requires aid of supportive devices such as crutches, cane, wheelchairs, walkers, the use of special transportation or the assistance of another person to leave their place of residence. There is a normal inability to leave the home and doing so requires considerable and taxing effort. Other absences are for medical reasons / religious services and are infrequent or of short duration when for other reasons). If current dressing causes regression in wound condition, may D/C ordered dressing product/s and apply Normal Saline Moist Dressing daily until next [place] / Other MD appointment. [place] of regression in wound condition at [phone]. Please direct any NON-WOUND related issues/requests for orders to patient's Primary Care Physician MARLEENA, SHUBERT ([id number]) I have recommended we change over to [facility] and we should continue her local compression. I have told her to aggressively elevate and exercise to reduce her lymphedema. Electronic  Signature(s) Signed: 03/14/2016 2:40:01 PM By: Christin Fudge MD, FACS Entered By: Christin Fudge on 03/14/2016 14:40:01 [name]  ([id number]) -------------------------------------------------------------------------------- SuperBill Details Patient Name: [name]. Date of Service: 03/14/2016 Medical Record Number: [id number] Patient Account Number: [id number] Date of Birth/Sex: 01/25/1941 (75 y.o. Female) Treating RN: Montey Hora Primary Care Physician: Glendale Chard Other Clinician: Referring Physician: Glendale Chard Treating Physician/Extender: Frann Rider in Treatment: 6 Diagnosis Coding ICD-10 Codes Code Description E11.622 Type 2 diabetes mellitus with other skin ulcer L97.222 Non-pressure chronic ulcer of left calf with fat layer exposed I89.0 Lymphedema, not elsewhere classified Facility Procedures CPT4 Code: [id number] Description: 39030 - DEB SUBQ TISSUE 20 SQ CM/< ICD-10 Description Diagnosis E11.622 Type 2 diabetes mellitus with other skin ulcer L97.222 Non-pressure chronic ulcer of left calf with fat l I89.0 Lymphedema, not elsewhere classified Modifier: ayer exposed Quantity: 1 Physician Procedures CPT4 Code: 0923300 Description: 76226 - WC PHYS SUBQ TISS 20 SQ CM ICD-10 Description Diagnosis E11.622 Type 2 diabetes mellitus with other skin ulcer L97.222 Non-pressure chronic ulcer of left calf with fat l I89.0 Lymphedema, not elsewhere classified Modifier: ayer exposed Quantity: 1 Electronic Signature(s) Signed: 03/14/2016 2:40:49 PM By: Christin Fudge MD, FACS Entered By: Christin Fudge on 03/14/2016 14:40:49

## 2016-03-17 NOTE — Telephone Encounter (Signed)
I believe she needs to be seen in office, probably have a flu test, CXR. In the meantime have her start symptomatic relief with OTC decongestant like Thera-flu or Tylenol Cold and Flu. Have her seen in office as Acute OV. If she declines then seek care immediately in ED

## 2016-03-18 ENCOUNTER — Ambulatory Visit (INDEPENDENT_AMBULATORY_CARE_PROVIDER_SITE_OTHER): Payer: Medicare Other | Admitting: Internal Medicine

## 2016-03-18 ENCOUNTER — Encounter: Payer: Self-pay | Admitting: Internal Medicine

## 2016-03-18 VITALS — BP 126/72 | HR 100 | Ht 64.0 in | Wt 288.8 lb

## 2016-03-18 DIAGNOSIS — J849 Interstitial pulmonary disease, unspecified: Secondary | ICD-10-CM | POA: Diagnosis not present

## 2016-03-18 DIAGNOSIS — J479 Bronchiectasis, uncomplicated: Secondary | ICD-10-CM

## 2016-03-18 DIAGNOSIS — J209 Acute bronchitis, unspecified: Secondary | ICD-10-CM

## 2016-03-18 MED ORDER — DOXYCYCLINE HYCLATE 100 MG PO TABS: ORAL_TABLET | ORAL | 0 refills | Status: DC

## 2016-03-18 MED ORDER — PREDNISONE 10 MG PO TABS: ORAL_TABLET | ORAL | 0 refills | Status: DC

## 2016-03-18 NOTE — Patient Instructions (Addendum)
ICD-9-CM ICD-10-CM   1. Acute bronchitis, unspecified organism 466.0 J20.9   2. Bronchiectasis without complication (H[facility]) 945.8 J47.9   3. ILD (interstitial lung disease) ([place]) 515 J84.9   4. Morbid obesity, unspecified obesity type ([facility]) 278.01 E66.01     Suspend azithromycin this week  And till you complete doxy below - then restart Take doxycycline 192m po twice daily x 5 days; take after meals and avoid sunlight Please take prednisone 40 mg x1 day, then 30 mg x1 day, then 20 mg x1 day, then 10 mg x1 day, and then back to regular baseline use  Glad you are going to  [facility]  Immunosuppressants through rheumatology at [facility](plaquenil, allopurino, prednisonel)  followup 6 months or sooner if needed with Dr RChase Caller

## 2016-03-18 NOTE — Progress Notes (Signed)
Subjective:     Patient ID: [name], female   DOB: 12-Mar-1941, 75 y.o.   MRN: [id number]  PCP Maximino Greenland, MD  HPI    OV 01/03/2015  Chief Complaint  Patient presents with  . Follow-up    Pt c/o DOE, resolving cough that started over the weekend, chest discomfort when SOB and when lying down. Pt c/o increasing fatigue.    She has severe class 3 dyspnea with hypoxemia on account of BMI 50, diaast dsyfn, chronic pain, deconditioning and ILD due to RA on celcept/pred/bactrim   I personally last saw her in march 2-016. Since then has seen other provicers in this office and most recently in June 2016. In June 2016 admitted for acute CHF - cath showed mild pulm htn only with PA mean 24 and minimal CAD (reviewed chart of procedures June 2016). Dx as acute diast chf. Since then better. SHe had fu HRCT 11/29/14 that shows progression in ILD since march 2016 and now with UIP pattern (personally visualized image). She is finally accepting of fact that ILD is not makin cause of dyspnea and obesity is main problem. She is wondering about dc cellcept/pred/bactrim so as to minimize polypharmacy and side effect profile    OV 02/09/2015  Chief Complaint  Patient presents with  . Follow-up    Pt states her breathing is doing well the past few days. Pt recently went to [facility] ED for CP. Pt c/o prod cough with tan mucus. Pt denies CP/tightness    She has severe class 3 dyspnea with hypoxemia on account of BMI 50, diaast dsyfn, chronic pain, deconditioning and ILD due to RA. She is having progressive ILD. But given her miserable quality of life and polypharmacy and other side effects we decided to stop the CellCept and Bactrim. We also advise prednisone taper. This was all in the end of August 2016. But on 01/27/2015 she ended up in the emergency room with some respiratory difficulty. Her prednisone was bumped up. She subsequently saw her rheumatologist [name] was now tapering her prednisone again.  There is also some concerns of headaches and possibly temporal arteritis based on her description and apparently some kind of a biopsy scheduled. She feels that since stopping her CellCept and Bactrim her cough is returned. Nevertheless weight and knee pain and back pain continued to be major issues for her along with polypharmacy. Her Polysorb was includes MS Contin  [name]    OV 02/21/2015  - a ACUTE VISIT  Chief Complaint  Patient presents with  . Acute Visit    Pt c/o of chest tightness, productive cough with white/yellow mucus, and wheezing. Pt also c/o of feeling abdominal swelling/distention. Pt also c/o of constant fatigue. Pt states symptoms have been present x1 week.     Acute visit for this morbidly obese, rheumatoid arthritis patient with ILD who is now off CellCept  I saw her as recently as 02/09/2015. She reports that for the past 1 week she's having increased chest congestion, chest tightness, wheezing, shortness of breath, cough and yellow  sputum more than baseline. Symptoms are rated as moderate in severity. Relieved by inhaler. She has tried Mucinex for 1 week with some initial partial relief but symptoms progressed. So she made an acute visit.  She continues to stay off CellCept. She is not on prednisone 10 mg per day. She tells me [name] has held her prednisone at the dose because of concerns of  temporal arteritis but apparently now that his diagnoses being eliminated in the differential diagnosis. She will see [name] in the next few weeks to decide on further prednisone taper.    OV 04/16/2015  Chief Complaint  Patient presents with  . Follow-up    Pt states she felt much better after completing the abx and pred. Pt states her SOB is at basline, prod cough with yellow mucus at times, pt c/o chest tightness.     Morbidly obese female with rheumatoid arthritis with interstitial lung disease UIP pattern with associated deconditioning and  diastolic dysfunction  She continues to remain off CellCept. Last seen in October 2016. At that time I discharged her from the office with doxycycline and prednisone taper. After that early November 2016 she was given another course of Doxy and prednisone burst. This did not help. Sputum culture at this time showed normal flora. She called again with the middle of November was given Levaquin and prednisone burst. She feels Levaquin helped her immensely. At this point in time she is reporting progressive dyspnea and also worsening arthritis. She wants to go back on immunomodulators again. She has discussed Imuran with [name]. She is due to see [name] tomorrow. She also feels that she is unable to come off prednisone. She feels at a minimum she needs 10 mg of prednisone per day. She will discuss this with Dr. D her rheumatologist tomorrow. She's also interested in a second opinion at [facility] interstitial lung disease clinic which I did discuss with her most recently. She continues to use oxygen.     OV 07/17/2015  Chief Complaint  Patient presents with  . Follow-up    Pt states that she has improved. Pt c/o continued SOB with exertion, some ocugh and wheeze, and some intermittent chest tightness. Pt states that she does sleep a lot. Pt states that her heart rate remains high and that cardiology said it was due to her lungs. Pt is currently on Lasix, 38m, but still seems to retain a lot of fluid and is not seeming to go to the bathroom as frequently.    Follow-up interstitial lung disease secondary to rheumatoid arthritis in the setting of morbid obesity, poor quality of life and heavy chronic pain opioid use and depressive symptoms  Last seen November 2016. Since then she has seen [name] rheumatology clinic. She was given instructions on Imuran which she has red but she has not decided whether she should take it or not. She subsequently has followed up with [facility] interstitial lung disease clinic. CT scan  of the chest reviewed result shows possible UIP pattern 06/12/2015. She had autoimmune profile which was strongly positive for both rheumatoid factor and cyclic citrulline peptide both markers for rheumatoid arthritis. She did see Dr. CMargreta JourneyB on 07/02/2015. It appears that the note is not complete but according to the patient was supposed to be a multidisciplinary conference and they will get back to her. It is possible a second immunomodulators agent and is being contemplated but patient is leery of this given prior issues with opportunistic infection not otherwise specified and admissions to the hospital. In the interim she is also seen cardiology Janr 2017 Dr. BGwenlyn Foundwho did not feel dyspnea was cardiac related. I reviewed his note  OV 10/17/2015  Chief Complaint  Patient presents with  . Follow-up    Pt reports she feels improved. Pt c/o continued SOB with exertion, some wheeze and occasional cough. Pt denies CP/tightness.  Follow-up interstitial lung disease secondary to rheumatoid arthritis in the setting of morbid obesity, poor quality of life and heavy chronic pain opioid use and depressive symptoms   Since seeing me last in February 2017. She followed up at [facility] again pulmonary clinic in March 2017. I reviewed those notes. She saw Dr. Margreta Journey B on 07/31/2015. Due to the presence of bronchiectasis she's been started on  azithromycin 3 times a week. She says this has helped. In addition due to physical deconditioning and obesity she is getting home physical therapy is also help. She is interested in pulmonary rehabilitation although given obesity and back pain can be challenging. She nevertheless wants to try. She is on daily prednisone 10 mg a day. She met with [name] in the rheumatology clinic and has been referred now to [facility] rheumatology clinic for second opinion for a second immunomodulators. She continues to be morbidly obese and has been losing weight. She is somewhat  interested in visiting with a [facility].   OV 02/18/2016  Chief Complaint  Patient presents with  . Follow-up    Pt states she did not go to pulm rehab d/t BIL knee pain. Pt denies change in SOB, significant cough, CP/tightness and f/c/s. Pt states she is exercising at home. pt states she is doing well overall.    Problem List: 1. Extensive bronchiectasis - cylindrical vs. Traction - has been present and not significantly changed from 2014 to 2017 - has history of hospitalization for what sounds like pneumonia vs. Bronchiectasis flare as far back as 1999; underwent bronchoscopy which made her feel "like a new woman" in 1999 2. Pulmonary fibrosis - basilar predominant reticular opacities that have not change from 2014 to 2017 3. Rheumatoid arthritis: RF 959 ; CCP >300 - has been treated with methotrexate, biologics, etc in the past - none in the past several years - currently on 7.5/35m alter day 4. Morbid obesity with deconditioning - has had several hospitalizations in recent months; very deconditioned at present time  - [facility]   Followup for above  - [facility]. [facility] Review of the notes suggest that they have her on a slightly lower dose of prednisone with intent to taper to 5 mg per day. He also have her on hydroxychloroquine. No further disease modifying agents are being considered for rheumatoid arthritis. In terms of her interstitial lung disease and bronchiectasis she's on conservative therapy along with Activella device. This is helping and she feels better. Today she came in walking with her walker. This the happiest I have f seen her. She's not lost any weight but [facility] She tried to go  to pulmonary rehabilitation but could not because of knee pain. She is up-to-date with pneumonia vaccine. She's had a flu shot this season.    OV 03/18/2016  Chief Complaint  Patient presents with  . Acute Visit    Pt states breathing is not that bad today. Pt states she has cogestion build up, no tightness, SOB w/o excertion. Pt states breathing has been better since last visit except the mucus. No cough feels the need to cough but can not.      follow-up bronchiectasis and immunosppessive thrapy. Visit 3 month olow-up. Body mssindex is stll very high but she says she has lost weight. Overal well.  Last few days increasd symptoms of mucus, cough and subjective unwellness. No fever or colored sputum. But she feells she needs antibiotic and prednisone     has a past medical history of Arthritis; Asthma; Carotid artery occlusion; Chronic kidney disease; Clotting disorder ([place]); Contusion of left knee; COPD (chronic obstructive pulmonary disease) ([place]); Depression, reactive; Diabetes mellitus; Diastolic dysfunction; Family history of heart disease; High cholesterol; Hypertension; Interstitial lung disease ([place]); 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 ([place]) (02/17/2012); Morbid obesity ([place]); Normal coronary arteries; RA (rheumatoid arthritis) ([place]); Shortness of breath; Thyroid disease; and URI (upper respiratory infection).   reports that she has never smoked. She has never used smokeless tobacco.  Past Surgical History:  Procedure Laterality Date  . ABDOMINAL HYSTERECTOMY    . BRAIN SURGERY     Gamma knife 10/13. Needs repeat spring  '14  . CARDIAC CATHETERIZATION N/A 10/31/2014   Procedure: Right/Left Heart Cath and Coronary Angiography;  Surgeon: [name], MD;  Location: [place] [facility];  Service: Cardiovascular;  Laterality: N/A;  . ESOPHAGOGASTRODUODENOSCOPY (EGD) WITH PROPOFOL N/A 09/14/2014   Procedure: ESOPHAGOGASTRODUODENOSCOPY (EGD) WITH PROPOFOL;   Surgeon: [name], MD;  Location: [facility];  Service: Endoscopy;  Laterality: N/A;  . OVARY SURGERY    . SHOULDER SURGERY Left   . TONSILLECTOMY  age 48  . VIDEO BRONCHOSCOPY Bilateral 05/31/2013   Procedure: VIDEO BRONCHOSCOPY WITHOUT FLUORO;  Surgeon: Brand Males, MD;  Location: [employer];  Service: Cardiopulmonary;  Laterality: Bilateral;  . video bronscoscopy  2000   lung    Allergies  Allergen Reactions  . Codeine Swelling    Facial swelling  . Remicade [Infliximab] Anaphylaxis    "sent me into shock"  . Zestril [Lisinopril] Swelling    Face and neck swelling    Immunization History  Administered Date(s) Administered  . Influenza Split 01/29/2012, 03/30/2013, 02/10/2014  . Influenza,inj,Quad PF,36+ Mos 02/09/2015, 12/18/2015  . PPD Test 04/23/2015  . Pneumococcal Conjugate-13 08/21/2014  . Pneumococcal Polysaccharide-23 02/17/2012  . Tdap 12/27/2015    Family History  Problem Relation Age of Onset  . Diabetes Mother   . Heart attack Mother   . Hypertension Father   . Lung cancer Father   . Diabetes Sister   . Diabetes Brother   . Hypertension Brother   . Heart disease Brother   . Heart attack Brother   . Kidney cancer Brother   . Uterine cancer Daughter   . Breast cancer Sister   . Rheum arthritis Maternal Uncle   . Gout Brother   . Kidney failure Brother     x 5     Current Outpatient Prescriptions:  .  ACCU-CHEK SMARTVIEW test strip, 1 each by Other route every morning. , Disp: , Rfl: 1 .  acetaminophen (TYLENOL) 500 MG tablet, Take 1,000 mg by mouth every 6 (six) hours as needed for moderate pain. , Disp: , Rfl:  .  albuterol (PROVENTIL HFA;VENTOLIN HFA) 108 (90 BASE) MCG/ACT inhaler, Inhale 2 puffs into the lungs every 6 (six) hours as needed for wheezing or shortness of breath., Disp: , Rfl:  .  allopurinol (ZYLOPRIM) 300 MG tablet, Take by mouth., Disp: , Rfl:  .  aspirin EC 81 MG tablet, Take 81 mg by mouth every morning. , Disp: ,  Rfl:  .  atorvastatin (LIPITOR) 20 MG tablet, Take 10 mg by mouth every evening. , Disp: , Rfl:  .  azithromycin (ZITHROMAX) 500 MG tablet, Take 1 tablet by mouth  daily. TAKE ON MON, WED & FRI, Disp: , Rfl: 0 .  Calcium Carb-Cholecalciferol (CALCIUM + D3 PO), Take 2 tablets by mouth daily with lunch. , Disp: , Rfl:  .  cholecalciferol (VITAMIN D) 1000 UNITS tablet, Take 2,000 Units by mouth daily with lunch. , Disp: , Rfl:  .  cyclobenzaprine (FLEXERIL) 5 MG tablet, Take 1 tablet (5 mg total) by mouth at bedtime as needed for muscle spasms., Disp: 30 tablet, Rfl: 1 .  diclofenac sodium (VOLTAREN) 1 % GEL, Apply 1 application topically 3 (three) times daily., Disp: 3 Tube, Rfl: 4 .  DULoxetine (CYMBALTA) 60 MG capsule, Take 1 capsule (60 mg total) by mouth daily., Disp: 30 capsule, Rfl: 4 .  fluticasone (FLONASE) 50 MCG/ACT nasal spray, Place 2 sprays into both nostrils daily., Disp: 16 g, Rfl: 3 .  furosemide (LASIX) 40 MG tablet, Take 1 tablet (40 mg total) by mouth 2 (two) times daily. (Patient taking differently: Take 20 mg by mouth daily. ), Disp: 60 tablet, Rfl: 11 .  gabapentin (NEURONTIN) 600 MG tablet, Take two in the morning , Two mid- afternoon and two at bedtime, Disp: 540 tablet, Rfl: 0 .  guaiFENesin (MUCINEX) 600 MG 12 hr tablet, Take 1,200 mg by mouth 2 (two) times daily., Disp: , Rfl:  .  hydroxychloroquine (PLAQUENIL) 200 MG tablet, Take 200 mg by mouth daily., Disp: , Rfl:  .  levothyroxine (SYNTHROID, LEVOTHROID) 50 MCG tablet, Take 1 tablet (50 mcg total) by mouth daily., Disp: 30 tablet, Rfl: 1 .  metoprolol succinate (TOPROL-XL) 100 MG 24 hr tablet, Take 100 mg by mouth every morning. Take with or immediately following a meal., Disp: , Rfl:  .  morphine (MS CONTIN) 15 MG 12 hr tablet, Take 1 tablet (15 mg total) by mouth every 12 (twelve) hours., Disp: 60 tablet, Rfl: 0 .  morphine (MSIR) 15 MG tablet, Take 1 tablet (15 mg total) by mouth every 8 (eight) hours as needed for  severe pain., Disp: 90 tablet, Rfl: 0 .  Multiple Vitamin (MULTIVITAMIN WITH MINERALS) TABS, Take 1 tablet by mouth daily with lunch. , Disp: , Rfl:  .  olmesartan-hydrochlorothiazide (BENICAR HCT) 40-25 MG tablet, Take 1 tablet by mouth daily. , Disp: , Rfl:  .  OXYGEN-HELIUM IN, Inhale 2 L into the lungs at bedtime. And on exertion, Disp: , Rfl:  .  pantoprazole (PROTONIX) 40 MG tablet, take 1 tablet by mouth once daily MINUTES BEFORE 1ST MEAL OF THE DAY, Disp: 30 tablet, Rfl: 5 .  predniSONE (DELTASONE) 10 MG tablet, Take 7.5 mg by mouth every morning. Alternating 72m and 7.547m Disp: , Rfl: 0 .  sodium chloride (OCEAN) 0.65 % SOLN nasal spray, Place 1 spray into both nostrils as needed for congestion., Disp: , Rfl:  .  Spacer/Aero-Holding Chambers (AEROCHAMBER MV) inhaler, Use as instructed, Disp: 1 each, Rfl: 0 .  VICTOZA 18 MG/3ML SOPN, Inject 1.8 mg as directed daily. , Disp: , Rfl: 0 .  collagenase (SANTYL) ointment, Apply 1 application topically daily., Disp: , Rfl:  .  famotidine (PEPCID) 20 MG tablet, Take 1 tablet (20 mg total) by mouth at bedtime. (Patient not taking: Reported on 03/18/2016), Disp: 30 tablet, Rfl: 11    Review of Systems     Objective:   Physical Exam  Constitutional: She is oriented to person, place, and time. She appears well-developed and well-nourished. No distress.  HENT:  Head: Normocephalic and atraumatic.  Right Ear: External ear normal.  Left Ear:  External ear normal.  Mouth/Throat: Oropharynx is clear and moist. No oropharyngeal exudate.  Eyes: Conjunctivae and EOM are normal. Pupils are equal, round, and reactive to light. Right eye exhibits no discharge. Left eye exhibits no discharge. No scleral icterus.  Neck: Normal range of motion. Neck supple. No JVD present. No tracheal deviation present. No thyromegaly present.  Cardiovascular: Normal rate, regular rhythm, normal heart sounds and intact distal pulses.  Exam reveals no Mcdaniel and no friction  rub.   No murmur heard. Pulmonary/Chest: Effort normal and breath sounds normal. No respiratory distress. She has no wheezes. She has no rales. She exhibits no tenderness.  Abdominal: Soft. Bowel sounds are normal. She exhibits no distension and no mass. There is no tenderness. There is no rebound and no guarding.  Musculoskeletal: Normal range of motion. She exhibits no edema or tenderness.  walker  Lymphadenopathy:    She has no cervical adenopathy.  Neurological: She is alert and oriented to person, place, and time. She has normal reflexes. No cranial nerve deficit. She exhibits normal muscle tone. Coordination normal.  Skin: Skin is warm and dry. No rash noted. She is not diaphoretic. No erythema. No pallor.  Left leg lateral aspect - bandaid from recent skin tear  Psychiatric: She has a normal mood and affect. Her behavior is normal. Judgment and thought content normal.  Vitals reviewed.    Vitals:   03/18/16 1050  BP: 126/72  Pulse: 100  SpO2: 90%  Weight: 288 lb 12.8 oz (131 kg)  Height: _0  (1.626 m)   Estimated body mass index is 49.57 kg/m as calculated from the following:   Height as of this encounter: _1  (1.626 m).   Weight as of this encounter: 288 lb 12.8 oz (131 kg).       Assessment:       ICD-9-CM ICD-10-CM   1. Acute bronchitis, unspecified organism 466.0 J20.9   2. Bronchiectasis without complication (HCC) 188.4 J47.9   3. ILD (interstitial lung disease) ([place]) 515 J84.9   4. Morbid obesity, unspecified obesity type ([place]) 278.01 E66.01        Plan:      Suspend azithromycin this week  And till you complete doxy below - then restart Take doxycycline 122m po twice daily x 5 days; take after meals and avoid sunlight Please take prednisone 40 mg x1 day, then 30 mg x1 day, then 20 mg x1 day, then 10 mg x1 day, and then back to regular baseline use  [facility]  Immunosuppressants through  rheumatology at [facility] (plaquenil, allopurino, prednisonel)  followup 6 months or sooner if needed with Dr RChase Caller   Dr. MBrand Males M.D., FSurgical Elite Of AvondaleC.P Pulmonary and Critical Care Medicine Staff Physician CRowleyPulmonary and Critical Care Pager: [phone] If no answer or between  15:00h - 7:00h: call [phone]  03/18/2016 11:15 AM

## 2016-03-19 DIAGNOSIS — S81812D Laceration without foreign body, left lower leg, subsequent encounter: Secondary | ICD-10-CM | POA: Diagnosis not present

## 2016-03-19 DIAGNOSIS — I5032 Chronic diastolic (congestive) heart failure: Secondary | ICD-10-CM | POA: Diagnosis not present

## 2016-03-19 DIAGNOSIS — N182 Chronic kidney disease, stage 2 (mild): Secondary | ICD-10-CM | POA: Diagnosis not present

## 2016-03-19 DIAGNOSIS — T798XXD Other early complications of trauma, subsequent encounter: Secondary | ICD-10-CM | POA: Diagnosis not present

## 2016-03-19 DIAGNOSIS — I130 Hypertensive heart and chronic kidney disease with heart failure and stage 1 through stage 4 chronic kidney disease, or unspecified chronic kidney disease: Secondary | ICD-10-CM | POA: Diagnosis not present

## 2016-03-19 DIAGNOSIS — E1122 Type 2 diabetes mellitus with diabetic chronic kidney disease: Secondary | ICD-10-CM | POA: Diagnosis not present

## 2016-03-21 ENCOUNTER — Ambulatory Visit: Payer: Medicare Other | Admitting: Surgery

## 2016-03-21 ENCOUNTER — Encounter: Payer: Medicare Other | Attending: Surgery | Admitting: Surgery

## 2016-03-21 DIAGNOSIS — E11622 Type 2 diabetes mellitus with other skin ulcer: Secondary | ICD-10-CM | POA: Insufficient documentation

## 2016-03-21 DIAGNOSIS — L97222 Non-pressure chronic ulcer of left calf with fat layer exposed: Secondary | ICD-10-CM | POA: Diagnosis not present

## 2016-03-21 DIAGNOSIS — M069 Rheumatoid arthritis, unspecified: Secondary | ICD-10-CM | POA: Diagnosis not present

## 2016-03-21 DIAGNOSIS — Z79899 Other long term (current) drug therapy: Secondary | ICD-10-CM | POA: Insufficient documentation

## 2016-03-21 DIAGNOSIS — Z6841 Body Mass Index (BMI) 40.0 and over, adult: Secondary | ICD-10-CM | POA: Insufficient documentation

## 2016-03-21 DIAGNOSIS — I129 Hypertensive chronic kidney disease with stage 1 through stage 4 chronic kidney disease, or unspecified chronic kidney disease: Secondary | ICD-10-CM | POA: Insufficient documentation

## 2016-03-21 DIAGNOSIS — J449 Chronic obstructive pulmonary disease, unspecified: Secondary | ICD-10-CM | POA: Diagnosis not present

## 2016-03-21 DIAGNOSIS — E1140 Type 2 diabetes mellitus with diabetic neuropathy, unspecified: Secondary | ICD-10-CM | POA: Insufficient documentation

## 2016-03-21 DIAGNOSIS — I890 Lymphedema, not elsewhere classified: Secondary | ICD-10-CM | POA: Insufficient documentation

## 2016-03-21 DIAGNOSIS — N189 Chronic kidney disease, unspecified: Secondary | ICD-10-CM | POA: Diagnosis not present

## 2016-03-21 DIAGNOSIS — E1122 Type 2 diabetes mellitus with diabetic chronic kidney disease: Secondary | ICD-10-CM | POA: Insufficient documentation

## 2016-03-22 NOTE — Progress Notes (Signed)
SARABELLE, GENSON ([id number]) Visit Report for 03/21/2016 Arrival Information Details Patient Name: [name], [name]. Date of Service: 03/21/2016 10:00 AM Medical Record Number: [id number] Patient Account Number: [id number] Date of Birth/Sex: Jun 26, 1940 (75 y.o. Female) Treating RN: Cornell Barman Primary Care Physician: Glendale Chard Other Clinician: Referring Physician: Glendale Chard Treating Physician/Extender: Frann Rider in Treatment: 7 Visit Information History Since Last Visit Added or deleted any medications: No Patient Arrived: Walker Any new allergies or adverse reactions: No Arrival Time: 10:01 Had a fall or experienced change in No Accompanied By: daughter activities of daily living that may affect Transfer Assistance: None risk of falls: Patient Identification Verified: Yes Signs or symptoms of abuse/neglect since last No Secondary Verification Process Yes visito Completed: Hospitalized since last visit: No Patient Has Alerts: Yes Has Dressing in Place as Prescribed: Yes Patient Alerts: DM II Pain Present Now: No Electronic Signature(s) Signed: 03/21/2016 10:50:42 AM By: Gretta Cool, RN, BSN, Kim RN, BSN Entered By: Gretta Cool, RN, BSN, Kim on 03/21/2016 10:01:27 [name] ([id number]) -------------------------------------------------------------------------------- Encounter Discharge Information Details Patient Name: [name]. Date of Service: 03/21/2016 10:00 AM Medical Record Number: [id number] Patient Account Number: [id number] Date of Birth/Sex: 11-Feb-1941 (75 y.o. Female) Treating RN: Cornell Barman Primary Care Physician: Glendale Chard Other Clinician: Referring Physician: Glendale Chard Treating Physician/Extender: Frann Rider in Treatment: 7 Encounter Discharge Information Items Discharge Pain Level: 0 Discharge Condition: Stable Ambulatory Status: Ambulatory Discharge Destination: Home Transportation: Private  Auto Accompanied By: daughter Schedule Follow-up Appointment: Yes Medication Reconciliation completed and provided to Patient/Care Yes Presleigh Feldstein: Provided on Clinical Summary of Care: 03/21/2016 Form Type Recipient Paper Patient DG Electronic Signature(s) Signed: 03/21/2016 10:50:42 AM By: Gretta Cool RN, BSN, Kim RN, BSN Previous Signature: 03/21/2016 10:25:30 AM Version By: Ruthine Dose Entered By: Gretta Cool RN, BSN, Kim on 03/21/2016 10:26:21 [name] ([id number]) -------------------------------------------------------------------------------- Lower Extremity Assessment Details Patient Name: [name]. Date of Service: 03/21/2016 10:00 AM Medical Record Number: [id number] Patient Account Number: [id number] Date of Birth/Sex: 17-Nov-1940 (75 y.o. Female) Treating RN: Cornell Barman Primary Care Physician: Glendale Chard Other Clinician: Referring Physician: Glendale Chard Treating Physician/Extender: Frann Rider in Treatment: 7 Edema Assessment Assessed: [Left: No] [Right: No] Edema: [Left: Ye] [Right: s] Calf Left: Right: Point of Measurement: 34 cm From Medial Instep 48 cm cm Ankle Left: Right: Point of Measurement: 11 cm From Medial Instep 31.5 cm cm Vascular Assessment Claudication: Claudication Assessment [Left:None] Pulses: Posterior Tibial Palpable: [Left:Yes] Dorsalis Pedis Palpable: [Left:Yes] Extremity colors, hair growth, and conditions: Extremity Color: [Left:Hyperpigmented] Hair Growth on Extremity: [Left:No] Temperature of Extremity: [Left:Warm] Capillary Refill: [Left:< 3 seconds] Dependent Rubor: [Left:No] Blanched when Elevated: [Left:No] Lipodermatosclerosis: [Left:No] Toe Nail Assessment Left: Right: Thick: Yes Discolored: No Improper Length and Hygiene: No FERNANDO, TORRY ([id number]) Electronic Signature(s) Signed: 03/21/2016 10:50:42 AM By: Gretta Cool, RN, BSN, Kim RN, BSN Entered By: Gretta Cool, RN, BSN, Kim on 03/21/2016  10:08:06 [name] ([id number]) -------------------------------------------------------------------------------- Multi Wound Chart Details Patient Name: [name]. Date of Service: 03/21/2016 10:00 AM Medical Record Number: [id number] Patient Account Number: [id number] Date of Birth/Sex: 01/12/1941 (75 y.o. 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 ([id number]) Wound Margin: Distinct, outline attached N/A N/A Granulation Amount: Large (67-100%) N/A N/A Granulation Quality: Red N/A N/A Necrotic Amount: Small (1-33%) N/A N/A Exposed Structures: Fascia: No N/A N/A Fat: No Tendon: No Muscle: No Joint: No Bone: No Limited to Skin Breakdown Epithelialization: Medium (34-66%) N/A N/A Periwound Skin Texture: Edema: Yes N/A N/A Scarring: Yes Excoriation: No Induration: No Callus:  No Crepitus: No Fluctuance: No Friable: No Rash: No Periwound Skin Moist: Yes N/A N/A Moisture: Maceration: No Dry/Scaly: No Periwound Skin Color: Ecchymosis: Yes N/A N/A Hemosiderin Staining: Yes Atrophie Blanche: No Cyanosis: No Erythema: No Mottled: No Pallor: No Rubor: No Temperature: No Abnormality N/A N/A Tenderness on Yes N/A N/A Palpation: Wound Preparation: Ulcer Cleansing: N/A N/A Rinsed/Irrigated with Saline Topical Anesthetic Applied: Other: lidocaine 4% Treatment Notes Electronic Signature(s) Signed: 03/21/2016 10:50:42 AM By: Gretta Cool, RN, BSN, Kim RN, BSN Entered By: Gretta Cool, RN, BSN, Kim on 03/21/2016 10:13:40 [name] ([id number]) KELLYE, MIZNER ([id number]) -------------------------------------------------------------------------------- Multi-Disciplinary Care Plan Details Patient Name: LAINI, URICK. Date of Service: 03/21/2016 10:00 AM Medical Record Number: [id number] Patient Account Number: [id number] Date of Birth/Sex: 1941-03-25 (75 y.o. Female) Treating RN: Cornell Barman Primary Care Physician: Glendale Chard Other Clinician: Referring Physician: Glendale Chard Treating Physician/Extender: Frann Rider in Treatment: 7 Active Inactive Abuse / Safety / Falls / Self Care Management Nursing Diagnoses: Impaired physical mobility Potential for falls Goals: Patient will remain injury free Date Initiated: 02/01/2016 Goal Status: Active Interventions: Assess fall risk on admission and as needed Notes: Orientation to the Wound Care Program Nursing Diagnoses: Knowledge deficit related to the wound healing center program Goals: Patient/caregiver will verbalize understanding of the [facility] Date Initiated: 02/01/2016 Goal Status: Active Interventions: Provide education on orientation to the wound center Notes: Wound/Skin Impairment Nursing Diagnoses: Impaired tissue integrity Goals: Patient/caregiver  will verbalize understanding of skin care regimen IRVING, LUBBERS ([id number]) Date Initiated: 02/01/2016 Goal Status: Active Ulcer/skin breakdown will have a volume reduction of 30% by week 4 Date Initiated: 02/01/2016 Goal Status: Active Ulcer/skin breakdown will have a volume reduction of 50% by week 8 Date Initiated: 02/01/2016 Goal Status: Active Ulcer/skin breakdown will have a volume reduction of 80% by week 12 Date Initiated: 02/01/2016 Goal Status: Active Ulcer/skin breakdown will heal within 14 weeks Date Initiated: 02/01/2016 Goal Status: Active Interventions: Assess patient/caregiver ability to obtain necessary supplies Assess patient/caregiver ability to perform ulcer/skin care regimen upon admission and as needed Assess ulceration(s) every visit Notes: Electronic Signature(s) Signed: 03/21/2016 10:50:42 AM By: Gretta Cool, RN, BSN, Kim RN, BSN Entered By: Gretta Cool, RN, BSN, Kim on 03/21/2016 10:13:33 [name] ([id number]) -------------------------------------------------------------------------------- Pain Assessment Details Patient Name: [name]. Date of Service: 03/21/2016 10:00 AM Medical Record Number: [id number] Patient Account Number: [id number] Date of Birth/Sex: 05/20/1940 (75 y.o. Female) Treating RN: Cornell Barman Primary Care Physician: Glendale Chard Other Clinician: Referring Physician: Glendale Chard Treating Physician/Extender: Frann Rider  in Treatment: 7 Active Problems Location of Pain Severity and Description of Pain Patient Has Paino No Site Locations With Dressing Change: No Pain Management and Medication Current Pain Management: Electronic Signature(s) Signed: 03/21/2016 10:50:42 AM By: Gretta Cool, RN, BSN, Kim RN, BSN Entered By: Gretta Cool, RN, BSN, Kim on 03/21/2016 10:02:35 [name] ([id number]) -------------------------------------------------------------------------------- Patient/Caregiver Education Details Patient  Name: [name] Date of Service: 03/21/2016 10:00 AM Medical Record Number: [id number] Patient Account Number: [id number] Date of Birth/Gender: 07-May-1941 (75 y.o. Female) Treating RN: Cornell Barman Primary Care Physician: Glendale Chard Other Clinician: Referring Physician: Glendale Chard Treating Physician/Extender: Frann Rider in Treatment: 7 Education Assessment Education Provided To: Patient Education Topics Provided Wound/Skin Impairment: Handouts: Caring for Your Ulcer, Other: HHRn and continue wound care as prescribed Methods: Demonstration Responses: State content correctly Electronic Signature(s) Signed: 03/21/2016 10:50:42 AM By: Gretta Cool, RN, BSN, Kim RN, BSN Entered By: Gretta Cool, RN, BSN, Kim on 03/21/2016 10:26:45 [name] ([id number]) -------------------------------------------------------------------------------- Wound Assessment Details Patient Name: [name]. Date of Service: 03/21/2016 10:00 AM Medical Record Number: [id number] Patient Account Number: [id number] Date of Birth/Sex: May 06, 1941 (75 y.o. Female) Treating RN: Cornell Barman Primary Care Physician: Glendale Chard Other Clinician: Referring Physician: Glendale Chard Treating Physician/Extender: Frann Rider in Treatment: 7 Wound Status Wound Number: 1 Primary Diabetic Wound/Ulcer of the Lower Etiology: Extremity Wound Location: Left Lower Leg - Lateral Secondary Auto-immune Wounding Event: Trauma Etiology: Date Acquired: 12/27/2015 Wound Open Weeks Of Treatment: 7 Status: Clustered Wound: No Comorbid Cataracts, Lymphedema, History: Hypertension, Type II Diabetes, Gout, Rheumatoid Arthritis, Osteoarthritis, Neuropathy Photos Wound Measurements Length: (cm) 3 Width: (cm) 2.8 Depth: (cm) 0.4 Area: (cm) 6.597 Volume: (cm) 2.639 % Reduction in Area: 66.7% % Reduction in Volume: 33.3% Epithelialization: Medium (34-66%) Tunneling: No Undermining: No Wound  Description Classification: Grade 2 Wound Margin: Distinct, outline attached Exudate Amount: Medium Exudate Type: Sanguinous Exudate Color: red Wound Bed Granulation Amount: Large (67-100%) Exposed Structure Granulation Quality: Red Fascia Exposed: No Tashiro, Janifer E. ([id number]) Necrotic Amount: Small (1-33%) Fat Layer Exposed: No Necrotic Quality: Adherent Slough Tendon Exposed: No Muscle Exposed: No Joint Exposed: No Bone Exposed: No Limited to Skin Breakdown Periwound Skin Texture Texture Color No Abnormalities Noted: No No Abnormalities Noted: No Callus: No Atrophie Blanche: No Crepitus: No Cyanosis: No Excoriation: No Ecchymosis: Yes Fluctuance: No Erythema: No Friable: No Hemosiderin Staining: Yes Induration: No Mottled: No Localized Edema: Yes Pallor: No Rash: No Rubor: No Scarring: Yes Temperature / Pain Moisture Temperature: No Abnormality No Abnormalities Noted: No Tenderness on Palpation: Yes Dry / Scaly: No Maceration: No Moist: Yes Wound Preparation Ulcer Cleansing: Rinsed/Irrigated with Saline Topical Anesthetic Applied: Other: lidocaine 4%, Treatment Notes Wound #1 (Left, Lateral Lower Leg) 1. Cleansed with: Clean wound with Normal Saline 2. Anesthetic Topical Lidocaine 4% cream to wound bed prior to debridement 4. Dressing Applied: Hydrafera Blue 5. Secondary Dressing Applied Bordered Foam Dressing Electronic Signature(s) Signed: 03/21/2016 10:50:42 AM By: Gretta Cool, RN, BSN, Kim RN, BSN Entered By: Gretta Cool, RN, BSN, Kim on 03/21/2016 10:09:54 [name] ([id number]) -------------------------------------------------------------------------------- Vitals Details Patient Name: [name] Date of Service: 03/21/2016 10:00 AM Medical Record Number: [id number] Patient Account Number: [id number] Date of Birth/Sex: 11-06-1940 (75 y.o. Female) Treating RN: Cornell Barman Primary Care Physician: Glendale Chard Other  Clinician: Referring Physician: Glendale Chard Treating Physician/Extender: Frann Rider in Treatment: 7 Vital Signs Time Taken: 10:02 Temperature (F): 97.6 Height (in): 64 Pulse (bpm): 84 Weight (lbs): 283 Respiratory Rate (breaths/min): 18 Body Mass  Index (BMI): 48.6 Blood Pressure (mmHg): 112/64 Reference Range: 80 - 120 mg / dl Electronic Signature(s) Signed: 03/21/2016 10:50:42 AM By: Gretta Cool, RN, BSN, Kim RN, BSN Entered By: Gretta Cool, RN, BSN, Kim on 03/21/2016 10:03:13

## 2016-03-22 NOTE — Progress Notes (Addendum)
GENESI, STEFANKO ([id number]) Visit Report for 03/21/2016 Chief Complaint Document Details Patient Name: [name], [name] 03/21/2016 10:00 Date of Service: AM Medical Record [id number] Number: Patient Account Number: [id number] [birth year] (75 y.o. Treating RN: Cornell Barman Date of Birth/Sex: Female) Other Clinician: Primary Care Physician: Glendale Chard Treating Christin Fudge Referring Physician: Glendale Chard Physician/Extender: Suella Grove in Treatment: 7 Information Obtained from: Patient Chief Complaint Patients presents for treatment of an open diabetic ulcer the left lower extremity which she's had for about 4 weeks Electronic Signature(s) Signed: 03/21/2016 10:22:19 AM By: Christin Fudge MD, FACS Entered By: Christin Fudge on 03/21/2016 10:22:19 [name] ([id number]) -------------------------------------------------------------------------------- Debridement Details Patient Name: [name]. 03/21/2016 10:00 Date of Service: AM Medical Record [id number] Number: Patient Account Number: [id number] February 27, 1941 (75 y.o. Treating RN: Cornell Barman Date of Birth/Sex: Female) Other Clinician: Primary Care Physician: Glendale Chard Treating Migel Hannis Referring Physician: Glendale Chard Physician/Extender: Suella Grove in Treatment: 7 Debridement Performed for Wound #1 Left,Lateral Lower Leg Assessment: Performed By: Physician Christin Fudge, MD Debridement: Debridement Pre-procedure Yes - 10:15 Verification/Time Out Taken: Start Time: 10:15 Pain Control: Lidocaine 5% topical ointment Level: Skin/Subcutaneous Tissue Total Area Debrided (Mcdaniel x 3 (cm) x 2 (cm) = 6 (cm) W): Tissue and other Viable, Non-Viable, Fibrin/Slough, Subcutaneous material debrided: Instrument: Forceps, Other : saline gauze Bleeding: Minimum Hemostasis Achieved: Pressure End Time: 10:18 Procedural Pain: 0 Post Procedural Pain: 0 Response to Treatment: Procedure was tolerated well Post  Debridement Measurements of Total Wound Length: (cm) 3 Width: (cm) 2.8 Depth: (cm) 0.5 Volume: (cm) 3.299 Character of Wound/Ulcer Post Requires Further Debridement Debridement: Severity of Tissue Post Debridement: Fat layer exposed Post Procedure Diagnosis Same as Pre-procedure Electronic Signature(s) Signed: 03/21/2016 10:22:08 AM By: Christin Fudge MD, FACS Signed: 03/21/2016 10:50:42 AM By: Gretta Cool RN, BSN, Kim RN, BSN Martucci, Brandelyn E. ([id number]) Entered By: Christin Fudge on 03/21/2016 10:22:08 [name], [name] ([id number]) -------------------------------------------------------------------------------- HPI Details Patient Name: [name], [name] 03/21/2016 10:00 Date of Service: AM Medical Record [id number] Number: Patient Account Number: [id number] 08/04/1940 (75 y.o. Treating RN: Cornell Barman Date of Birth/Sex: Female) Other Clinician: Primary Care Physician: Glendale Chard Treating Christin Fudge Referring Physician: Glendale Chard Physician/Extender: Suella Grove in Treatment: 7 History of Present Illness Location: left lower extremity lateral calf Quality: Patient reports experiencing a dull pain to affected area(s). Severity: Patient states wound are getting worse. Duration: Patient has had the wound for < 4 weeks prior to presenting for treatment Timing: Pain in wound is Intermittent (comes and goes Context: The wound occurred when the patient was injured with a sharp corner for car door Modifying Factors: Other treatment(s) tried include:local care and 3 courses of antibiotics including Keflex, Bactrim and doxycycline Associated Signs and Symptoms: Patient reports having increase swelling. HPI Description: 75 year old patient with a past medical history of diabetes mellitus, chronic kidney disease, COPD, hypertension, rheumatoid arthritis had a lacerated wound to her left calf when she injured it on the corner of a car door and it was a long 12 cm laceration. x-ray  done at the time revealed that there was no fracture or radiopaque foreign body. the wound was sutured with 4-0 Prolene simple interrupted sutures and a total of 14 sutures were applied. on August 31 she was seen in the ER and it was noted that the wound had dehisced and she was treated with Keflex. She has completed a course of doxycycline recently Medical history also significant for status post abdominal hysterectomy, brain surgery, cardiac catheterization, EGD and bronchoscopy. of  late she was back in the ER and was seen last on August 31 and they tried Silvadene 02/22/16: pt reports discomfort and mild pain just below her wound. however, she admits on legs more than usual. no redness, foul odor or purulence. the wound is measuring slightly larger today, but likely secondary to poor edema control. nursing staff gave information to pt today regarding purchase of her compression stockings. she was encouraged to purchase soon. 02/29/16: returns today for f/u. she denies systemic s/s of infection. she brought her new compression stockings today. 03/21/2016 -- he has been put on an antibiotic and prednisone for a bronchitis by her pulmonologist. Electronic Signature(s) Signed: 03/21/2016 10:22:55 AM By: Christin Fudge MD, FACS Entered By: Christin Fudge on 03/21/2016 10:22:55 [name] ([id number]) -------------------------------------------------------------------------------- Physical Exam Details Patient Name: [name], [name] 03/21/2016 10:00 Date of Service: AM Medical Record [id number] Number: Patient Account Number: [id number] 1940/10/22 (75 y.o. Treating RN: Cornell Barman Date of Birth/Sex: Female) Other Clinician: Primary Care Physician: Glendale Chard Treating Christin Fudge Referring Physician: Glendale Chard Physician/Extender: Weeks in Treatment: 7 Constitutional . Pulse regular. Respirations normal and unlabored. Afebrile. . Eyes Nonicteric. Reactive to  light. Ears, Nose, Mouth, and Throat Lips, teeth, and gums WNL.[name] Moist mucosa without lesions. Neck supple and nontender. No palpable supraclavicular or cervical adenopathy. Normal sized without goiter. Respiratory WNL. No retractions.. Cardiovascular Pedal Pulses WNL. No clubbing, cyanosis or edema. Lymphatic No adneopathy. No adenopathy. No adenopathy. Musculoskeletal Adexa without tenderness or enlargement.. Digits and nails w/o clubbing, cyanosis, infection, petechiae, ischemia, or inflammatory conditions.. Integumentary (Hair, Skin) No suspicious lesions. No crepitus or fluctuance. No peri-wound warmth or erythema. No masses.[name] Psychiatric Judgement and insight Intact.. No evidence of depression, anxiety, or agitation.. Notes the wound is looking much better today and the hyper granulation tissue has gone down significantly. There are pockets of necrotic debris in the subcutaneous area which I have removed with a toothed forcep and moist saline gauze and overall that is great improvement. Electronic Signature(s) Signed: 03/21/2016 10:23:36 AM By: Christin Fudge MD, FACS Entered By: Christin Fudge on 03/21/2016 10:23:36 [name] ([id number]) -------------------------------------------------------------------------------- Physician Orders Details Patient Name: [name], [name] 03/21/2016 10:00 Date of Service: AM Medical Record [id number] Number: Patient Account Number: [id number] 1940/06/14 (75 y.o. Treating RN: Cornell Barman Date of Birth/Sex: Female) Other Clinician: Primary Care Physician: Glendale Chard Treating Leland Staszewski Referring Physician: Glendale Chard Physician/Extender: Suella Grove in Treatment: 7 Verbal / Phone Orders: Yes Clinician: Cornell Barman Read Back and Verified: Yes Diagnosis Coding ICD-10 Coding Code Description E11.622 Type 2 diabetes mellitus with other skin ulcer L97.222 Non-pressure chronic ulcer of left calf with fat layer exposed I89.0  Lymphedema, not elsewhere classified Wound Cleansing Wound #1 Left,Lateral Lower Leg o Clean wound with Normal Saline. o May Shower, gently pat wound dry prior to applying new dressing. Anesthetic Wound #1 Left,Lateral Lower Leg o Topical Lidocaine 4% cream applied to wound bed prior to debridement Skin Barriers/Peri-Wound Care Wound #1 Left,Lateral Lower Leg o Skin Prep Primary Wound Dressing Wound #1 Left,Lateral Lower Leg o Hydrafera Blue Secondary Dressing Wound #1 Left,Lateral Lower Leg o Dry Gauze o Boardered Foam Dressing Dressing Change Frequency Wound #1 Left,Lateral Lower Leg o Change dressing every day. Follow-up Appointments [name], [name] ([id number]) Wound #1 Left,Lateral Lower Leg o Return Appointment in 1 week. Edema Control Wound #1 Left,Lateral Lower Leg o Patient to wear own compression stockings Home Health Wound #1 Left,Lateral Lower Leg o [facility] Visits - [name] -  [facility] to visit patient 2 times weekly, family to change dressing all other days o Home Health Nurse may visit PRN to address patientos wound care needs. o FACE TO FACE ENCOUNTER: MEDICARE and MEDICAID PATIENTS: I certify that this patient is under my care and that I had a face-to-face encounter that meets the physician face-to-face encounter requirements with this patient on this date. The encounter with the patient was in whole or in part for the following MEDICAL CONDITION: (primary reason for [place]) MEDICAL NECESSITY: I certify, that based on my findings, NURSING services are a medically necessary home health service. HOME BOUND STATUS: I certify that my clinical findings support that this patient is homebound (i.e., Due to illness or injury, pt requires aid of supportive devices such as crutches, cane, wheelchairs, walkers, the use of special transportation or the assistance of another person to leave their place of residence. There is  a normal inability to leave the home and doing so requires considerable and taxing effort. Other absences are for medical reasons / religious services and are infrequent or of short duration when for other reasons). o If current dressing causes regression in wound condition, may D/C ordered dressing product/s and apply Normal Saline Moist Dressing daily until next [place] / Other MD appointment. Painter of regression in wound condition at [phone]. o Please direct any NON-WOUND related issues/requests for orders to patient's Primary Care Physician Electronic Signature(s) Signed: 03/21/2016 10:50:42 AM By: Gretta Cool RN, BSN, Kim RN, BSN Signed: 03/21/2016 4:03:12 PM By: Christin Fudge MD, FACS Entered By: Gretta Cool RN, BSN, Kim on 03/21/2016 10:25:10 [name] ([id number]) -------------------------------------------------------------------------------- Problem List Details Patient Name: [name], [name] 03/21/2016 10:00 Date of Service: AM Medical Record [id number] Number: Patient Account Number: [id number] [birth year] (75 y.o. Treating RN: Cornell Barman Date of Birth/Sex: Female) Other Clinician: Primary Care Physician: Glendale Chard Treating Christin Fudge Referring Physician: Glendale Chard Physician/Extender: Suella Grove in Treatment: 7 Active Problems ICD-10 Encounter Code Description Active Date Diagnosis E11.622 Type 2 diabetes mellitus with other skin ulcer 02/01/2016 Yes L97.222 Non-pressure chronic ulcer of left calf with fat layer 02/01/2016 Yes exposed I89.0 Lymphedema, not elsewhere classified 02/01/2016 Yes Inactive Problems Resolved Problems Electronic Signature(s) Signed: 03/21/2016 10:20:43 AM By: Christin Fudge MD, FACS Entered By: Christin Fudge on 03/21/2016 10:20:43 [name] ([id number]) -------------------------------------------------------------------------------- Progress Note Details Patient Name: [name]. 03/21/2016 10:00 Date of Service: AM Medical Record [id number] Number: Patient Account Number: [id number] 01-02-1941 (75 y.o. Treating RN: Cornell Barman Date of Birth/Sex: Female) Other Clinician: Primary Care Physician: Glendale Chard Treating Latifa Noble Referring Physician: Glendale Chard Physician/Extender: Suella Grove in Treatment: 7 Subjective Chief Complaint Information obtained from Patient Patients presents for treatment of an open diabetic ulcer the left lower extremity which she's had for about 4 weeks History of Present Illness (HPI) The following HPI elements were documented for the patient's wound: Location: left lower extremity lateral calf Quality: Patient reports experiencing a dull pain to affected area(s). Severity: Patient states wound are getting worse. Duration: Patient has had the wound for < 4 weeks prior to presenting for treatment Timing: Pain in wound is Intermittent (comes and goes Context: The wound occurred when the patient was injured with a sharp corner for car door Modifying Factors: Other treatment(s) tried include:local care and 3 courses of antibiotics including Keflex, Bactrim and doxycycline Associated Signs and Symptoms: Patient reports having increase swelling. 75 year old patient with a past medical history of diabetes mellitus, chronic kidney disease, COPD,  hypertension, rheumatoid arthritis had a lacerated wound to her left calf when she injured it on the corner of a car door and it was a long 12 cm laceration. x-ray done at the time revealed that there was no fracture or radiopaque foreign body. the wound was sutured with 4-0 Prolene simple interrupted sutures and a total of 14 sutures were applied. on August 31 she was seen in the ER and it was noted that the wound had dehisced and she was treated with Keflex. She has completed a course of doxycycline recently Medical history also significant for status post abdominal hysterectomy,  brain surgery, cardiac catheterization, EGD and bronchoscopy. of late she was back in the ER and was seen last on August 31 and they tried Silvadene 02/22/16: pt reports discomfort and mild pain just below her wound. however, she admits on legs more than usual. no redness, foul odor or purulence. the wound is measuring slightly larger today, but likely secondary to poor edema control. nursing staff gave information to pt today regarding purchase of her compression stockings. she was encouraged to purchase soon. 02/29/16: returns today for f/u. she denies systemic s/s of infection. she brought her new compression stockings today. 03/21/2016 -- he has been put on an antibiotic and prednisone for a bronchitis by her pulmonologist. [name] ([id number]) Objective Constitutional Pulse regular. Respirations normal and unlabored. Afebrile. Vitals Time Taken: 10:02 AM, Height: 64 in, Weight: 283 lbs, BMI: 48.6, Temperature: 97.6 F, Pulse: 84 bpm, Respiratory Rate: 18 breaths/min, Blood Pressure: 112/64 mmHg. Eyes Nonicteric. Reactive to light. Ears, Nose, Mouth, and Throat Lips, teeth, and gums WNL.[name] Moist mucosa without lesions. Neck supple and nontender. No palpable supraclavicular or cervical adenopathy. Normal sized without goiter. Respiratory WNL. No retractions.. Cardiovascular Pedal Pulses WNL. No clubbing, cyanosis or edema. Lymphatic No adneopathy. No adenopathy. No adenopathy. Musculoskeletal Adexa without tenderness or enlargement.. Digits and nails w/o clubbing, cyanosis, infection, petechiae, ischemia, or inflammatory conditions.[name] Psychiatric Judgement and insight Intact.. No evidence of depression, anxiety, or agitation.. General Notes: the wound is looking much better today and the hyper granulation tissue has gone down significantly. There are pockets of necrotic debris in the subcutaneous area which I have removed with a toothed forcep and moist saline gauze and  overall that is great improvement. Integumentary (Hair, Skin) No suspicious lesions. No crepitus or fluctuance. No peri-wound warmth or erythema. No masses.. Wound #1 status is Open. Original cause of wound was Trauma. The wound is located on the Left,Lateral Lower Leg. The wound measures 3cm length x 2.8cm width x 0.4cm depth; 6.597cm^2 area and Frenkel, [name] ([id number]) 2.639cm^3 volume. The wound is limited to skin breakdown. There is no tunneling or undermining noted. There is a medium amount of sanguinous drainage noted. The wound margin is distinct with the outline attached to the wound base. There is large (67-100%) red granulation within the wound bed. There is a small (1-33%) amount of necrotic tissue within the wound bed including Adherent Slough. The periwound skin appearance exhibited: Localized Edema, Scarring, Moist, Ecchymosis, Hemosiderin Staining. The periwound skin appearance did not exhibit: Callus, Crepitus, Excoriation, Fluctuance, Friable, Induration, Rash, Dry/Scaly, Maceration, Atrophie Blanche, Cyanosis, Mottled, Pallor, Rubor, Erythema. Periwound temperature was noted as No Abnormality. The periwound has tenderness on palpation. Assessment Active Problems ICD-10 E11.622 - Type 2 diabetes mellitus with other skin ulcer L97.222 - Non-pressure chronic ulcer of left calf with fat layer exposed I89.0 - Lymphedema, not elsewhere classified Procedures Wound #1 Wound #1 is a  Diabetic Wound/Ulcer of the Lower Extremity located on the Left,Lateral Lower Leg . There was a Skin/Subcutaneous Tissue Debridement ([id number]) debridement with total area of 6 sq cm performed by Christin Fudge, MD. with the following instrument(s): Forceps and saline gauze to remove Viable and Non-Viable tissue/material including Fibrin/Slough and Subcutaneous after achieving pain control using Lidocaine 5% topical ointment. A time out was conducted at 10:15, prior to the start of the  procedure. A Minimum amount of bleeding was controlled with Pressure. The procedure was tolerated well with a pain level of 0 throughout and a pain level of 0 following the procedure. Post Debridement Measurements: 3cm length x 2.8cm width x 0.5cm depth; 3.299cm^3 volume. Character of Wound/Ulcer Post Debridement requires further debridement. Severity of Tissue Post Debridement is: Fat layer exposed. Post procedure Diagnosis Wound #1: Same as Pre-Procedure Plan Wound Cleansing: [name], [name]. ([id number]) Wound #1 Left,Lateral Lower Leg: Clean wound with Normal Saline. May Shower, gently pat wound dry prior to applying new dressing. Anesthetic: Wound #1 Left,Lateral Lower Leg: Topical Lidocaine 4% cream applied to wound bed prior to debridement Skin Barriers/Peri-Wound Care: Wound #1 Left,Lateral Lower Leg: Skin Prep Primary Wound Dressing: Wound #1 Left,Lateral Lower Leg: Hydrafera Blue Secondary Dressing: Wound #1 Left,Lateral Lower Leg: Dry Gauze Boardered Foam Dressing Dressing Change Frequency: Wound #1 Left,Lateral Lower Leg: Change dressing every day. Follow-up Appointments: Wound #1 Left,Lateral Lower Leg: Return Appointment in 1 week. Edema Control: Wound #1 Left,Lateral Lower Leg: Patient to wear own compression stockings Home Health: Wound #1 Left,Lateral Lower Leg: Continue Home Health Visits - [name] [facility] to visit patient 2 times weekly, family to change dressing all other days Home Health Nurse may visit PRN to address patient s wound care needs. FACE TO FACE ENCOUNTER: MEDICARE and MEDICAID PATIENTS: I certify that this patient is under my care and that I had a face-to-face encounter that meets the physician face-to-face encounter requirements with this patient on this date. The encounter with the patient was in whole or in part for the following MEDICAL CONDITION: (primary reason for [place]) MEDICAL NECESSITY: I certify, that based on my  findings, NURSING services are a medically necessary home health service. HOME BOUND STATUS: I certify that my clinical findings support that this patient is homebound (i.e., Due to illness or injury, pt requires aid of supportive devices such as crutches, cane, wheelchairs, walkers, the use of special transportation or the assistance of another person to leave their place of residence. There is a normal inability to leave the home and doing so requires considerable and taxing effort. Other absences are for medical reasons / religious services and are infrequent or of short duration when for other reasons). If current dressing causes regression in wound condition, may D/C ordered dressing product/s and apply Normal Saline Moist Dressing daily until next [place] / Other MD appointment. Pierce of regression in wound condition at [phone]. Please direct any NON-WOUND related issues/requests for orders to patient's Primary Care Physician [name], [name] ([id number]) I have recommended we continue with [facility] and we should continue her local compression. I have told her to aggressively elevate and exercise to reduce her lymphedema. Electronic Signature(s) Signed: 03/21/2016 10:49:39 AM By: Gretta Cool RN, BSN, Kim RN, BSN Signed: 03/21/2016 4:03:12 PM By: Christin Fudge MD, FACS Previous Signature: 03/21/2016 10:24:19 AM Version By: Christin Fudge MD, FACS Entered By: Gretta Cool RN, BSN, Kim on 03/21/2016 10:49:38 [name] ([id number]) -------------------------------------------------------------------------------- SuperBill Details Patient Name: Joan Lente  E. Date of Service: 03/21/2016 Medical Record Number: [id number] Patient Account Number: [id number] Date of Birth/Sex: 1940-07-11 (75 y.o. Female) Treating RN: Cornell Barman Primary Care Physician: Glendale Chard Other Clinician: Referring Physician: Glendale Chard Treating Physician/Extender:  Frann Rider in Treatment: 7 Diagnosis Coding ICD-10 Codes Code Description E11.622 Type 2 diabetes mellitus with other skin ulcer L97.222 Non-pressure chronic ulcer of left calf with fat layer exposed I89.0 Lymphedema, not elsewhere classified Facility Procedures CPT4 Code: [id number] Description: 53317 - DEB SUBQ TISSUE 20 SQ CM/< ICD-10 Description Diagnosis E11.622 Type 2 diabetes mellitus with other skin ulcer L97.222 Non-pressure chronic ulcer of left calf with fat Mcdaniel I89.0 Lymphedema, not elsewhere classified Modifier: ayer exposed Quantity: 1 Physician Procedures CPT4 Code: [id number] Description: 00447 - WC PHYS SUBQ TISS 20 SQ CM ICD-10 Description Diagnosis E11.622 Type 2 diabetes mellitus with other skin ulcer L97.222 Non-pressure chronic ulcer of left calf with fat Mcdaniel I89.0 Lymphedema, not elsewhere classified Modifier: ayer exposed Quantity: 1 Electronic Signature(s) Signed: 03/21/2016 10:24:40 AM By: Christin Fudge MD, FACS Entered By: Christin Fudge on 03/21/2016 10:24:40

## 2016-03-24 DIAGNOSIS — E1122 Type 2 diabetes mellitus with diabetic chronic kidney disease: Secondary | ICD-10-CM | POA: Diagnosis not present

## 2016-03-24 DIAGNOSIS — S81812D Laceration without foreign body, left lower leg, subsequent encounter: Secondary | ICD-10-CM | POA: Diagnosis not present

## 2016-03-24 DIAGNOSIS — I5032 Chronic diastolic (congestive) heart failure: Secondary | ICD-10-CM | POA: Diagnosis not present

## 2016-03-24 DIAGNOSIS — T798XXD Other early complications of trauma, subsequent encounter: Secondary | ICD-10-CM | POA: Diagnosis not present

## 2016-03-24 DIAGNOSIS — I130 Hypertensive heart and chronic kidney disease with heart failure and stage 1 through stage 4 chronic kidney disease, or unspecified chronic kidney disease: Secondary | ICD-10-CM | POA: Diagnosis not present

## 2016-03-24 DIAGNOSIS — N182 Chronic kidney disease, stage 2 (mild): Secondary | ICD-10-CM | POA: Diagnosis not present

## 2016-03-26 DIAGNOSIS — E669 Obesity, unspecified: Secondary | ICD-10-CM | POA: Diagnosis not present

## 2016-03-26 DIAGNOSIS — Z0000 Encounter for general adult medical examination without abnormal findings: Secondary | ICD-10-CM | POA: Diagnosis not present

## 2016-03-26 DIAGNOSIS — N183 Chronic kidney disease, stage 3 (moderate): Secondary | ICD-10-CM | POA: Diagnosis not present

## 2016-03-26 DIAGNOSIS — E1122 Type 2 diabetes mellitus with diabetic chronic kidney disease: Secondary | ICD-10-CM | POA: Diagnosis not present

## 2016-03-26 DIAGNOSIS — E039 Hypothyroidism, unspecified: Secondary | ICD-10-CM | POA: Diagnosis not present

## 2016-03-26 DIAGNOSIS — J8410 Pulmonary fibrosis, unspecified: Secondary | ICD-10-CM | POA: Diagnosis not present

## 2016-03-26 DIAGNOSIS — S81802D Unspecified open wound, left lower leg, subsequent encounter: Secondary | ICD-10-CM | POA: Diagnosis not present

## 2016-03-26 DIAGNOSIS — M069 Rheumatoid arthritis, unspecified: Secondary | ICD-10-CM | POA: Diagnosis not present

## 2016-03-27 ENCOUNTER — Ambulatory Visit: Payer: Medicare Other | Admitting: Surgery

## 2016-03-27 DIAGNOSIS — I5032 Chronic diastolic (congestive) heart failure: Secondary | ICD-10-CM | POA: Diagnosis not present

## 2016-03-27 DIAGNOSIS — S81812D Laceration without foreign body, left lower leg, subsequent encounter: Secondary | ICD-10-CM | POA: Diagnosis not present

## 2016-03-27 DIAGNOSIS — I130 Hypertensive heart and chronic kidney disease with heart failure and stage 1 through stage 4 chronic kidney disease, or unspecified chronic kidney disease: Secondary | ICD-10-CM | POA: Diagnosis not present

## 2016-03-27 DIAGNOSIS — T798XXD Other early complications of trauma, subsequent encounter: Secondary | ICD-10-CM | POA: Diagnosis not present

## 2016-03-27 DIAGNOSIS — N182 Chronic kidney disease, stage 2 (mild): Secondary | ICD-10-CM | POA: Diagnosis not present

## 2016-03-27 DIAGNOSIS — E1122 Type 2 diabetes mellitus with diabetic chronic kidney disease: Secondary | ICD-10-CM | POA: Diagnosis not present

## 2016-03-28 ENCOUNTER — Encounter: Payer: Medicare Other | Admitting: Surgery

## 2016-03-28 DIAGNOSIS — L97222 Non-pressure chronic ulcer of left calf with fat layer exposed: Secondary | ICD-10-CM | POA: Diagnosis not present

## 2016-03-28 DIAGNOSIS — L97821 Non-pressure chronic ulcer of other part of left lower leg limited to breakdown of skin: Secondary | ICD-10-CM | POA: Diagnosis not present

## 2016-03-28 DIAGNOSIS — N189 Chronic kidney disease, unspecified: Secondary | ICD-10-CM | POA: Diagnosis not present

## 2016-03-28 DIAGNOSIS — J449 Chronic obstructive pulmonary disease, unspecified: Secondary | ICD-10-CM | POA: Diagnosis not present

## 2016-03-28 DIAGNOSIS — E1122 Type 2 diabetes mellitus with diabetic chronic kidney disease: Secondary | ICD-10-CM | POA: Diagnosis not present

## 2016-03-28 DIAGNOSIS — I890 Lymphedema, not elsewhere classified: Secondary | ICD-10-CM | POA: Diagnosis not present

## 2016-03-28 DIAGNOSIS — E11622 Type 2 diabetes mellitus with other skin ulcer: Secondary | ICD-10-CM | POA: Diagnosis not present

## 2016-03-31 DIAGNOSIS — I130 Hypertensive heart and chronic kidney disease with heart failure and stage 1 through stage 4 chronic kidney disease, or unspecified chronic kidney disease: Secondary | ICD-10-CM | POA: Diagnosis not present

## 2016-03-31 DIAGNOSIS — T798XXD Other early complications of trauma, subsequent encounter: Secondary | ICD-10-CM | POA: Diagnosis not present

## 2016-03-31 DIAGNOSIS — E1122 Type 2 diabetes mellitus with diabetic chronic kidney disease: Secondary | ICD-10-CM | POA: Diagnosis not present

## 2016-03-31 DIAGNOSIS — R531 Weakness: Secondary | ICD-10-CM | POA: Diagnosis not present

## 2016-03-31 DIAGNOSIS — I5032 Chronic diastolic (congestive) heart failure: Secondary | ICD-10-CM | POA: Diagnosis not present

## 2016-03-31 DIAGNOSIS — N182 Chronic kidney disease, stage 2 (mild): Secondary | ICD-10-CM | POA: Diagnosis not present

## 2016-03-31 DIAGNOSIS — S81812D Laceration without foreign body, left lower leg, subsequent encounter: Secondary | ICD-10-CM | POA: Diagnosis not present

## 2016-03-31 NOTE — Progress Notes (Signed)
TANIAYA, RUDDER ([id number]) Visit Report for 03/28/2016 Arrival Information Details Patient Name: [name], [name]. Date of Service: 03/28/2016 2:15 PM Medical Record Number: [id number] Patient Account Number: [id number] Date of Birth/Sex: 04-12-1941 (75 y.o. Female) Treating RN: Montey Hora Primary Care Physician: Glendale Chard Other Clinician: Referring Physician: Glendale Chard Treating Physician/Extender: Frann Rider in Treatment: 8 Visit Information History Since Last Visit Added or deleted any medications: No Patient Arrived: Walker Any new allergies or adverse reactions: No Arrival Time: 14:13 Had a fall or experienced change in No Accompanied By: dtr activities of daily living that may affect Transfer Assistance: None risk of falls: Patient Identification Verified: Yes Signs or symptoms of abuse/neglect since last No Secondary Verification Process Completed: Yes visito Patient Has Alerts: Yes Hospitalized since last visit: No Patient Alerts: DM II Pain Present Now: No Electronic Signature(s) Signed: 03/31/2016 4:15:03 PM By: Montey Hora Entered By: Montey Hora on 03/28/2016 14:16:08 [name] ([id number]) -------------------------------------------------------------------------------- Encounter Discharge Information Details Patient Name: [name]. Date of Service: 03/28/2016 2:15 PM Medical Record Number: [id number] Patient Account Number: [id number] Date of Birth/Sex: 1941-02-08 (75 y.o. Female) Treating RN: Montey Hora Primary Care Physician: Glendale Chard Other Clinician: Referring Physician: Glendale Chard Treating Physician/Extender: Frann Rider in Treatment: 8 Encounter Discharge Information Items Discharge Pain Level: 0 Discharge Condition: Stable Ambulatory Status: Walker Discharge Destination: Home Transportation: Private Auto Accompanied By: dtr Schedule Follow-up Appointment: Yes Medication  Reconciliation completed and provided to Patient/Care No Quinterious Walraven: Provided on Clinical Summary of Care: 03/28/2016 Form Type Recipient Paper Patient DG Electronic Signature(s) Signed: 03/28/2016 3:13:00 PM By: Ruthine Dose Entered By: Ruthine Dose on 03/28/2016 15:13:00 [name] ([id number]) -------------------------------------------------------------------------------- Multi Wound Chart Details Patient Name: [name]. Date of Service: 03/28/2016 2:15 PM Medical Record Number: [id number] Patient Account Number: [id number] Date of Birth/Sex: 03/31/1941 (75 y.o. 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 ([id number]) Wound Margin: Distinct, outline attached N/A N/A Granulation Amount: Large (67-100%) N/A N/A Granulation Quality: Red N/A N/A Necrotic Amount: Small (1-33%) N/A  N/A Exposed Structures: Fascia: No N/A N/A Fat: No Tendon: No Muscle: No Joint: No Bone: No Limited to Skin Breakdown Epithelialization: Medium (34-66%) N/A N/A Periwound Skin Texture: Edema: Yes N/A N/A Scarring: Yes Excoriation: No Induration: No Callus: No Crepitus: No Fluctuance: No Friable: No Rash: No Periwound Skin Moist: Yes N/A N/A Moisture: Maceration: No Dry/Scaly: No Periwound Skin Color: Ecchymosis: Yes N/A N/A Hemosiderin Staining: Yes Atrophie Blanche: No Cyanosis: No Erythema: No Mottled: No Pallor: No Rubor: No Temperature: No Abnormality N/A N/A Tenderness on  Yes N/A N/A Palpation: Wound Preparation: Ulcer Cleansing: N/A N/A Rinsed/Irrigated with Saline Topical Anesthetic Applied: Other: lidocaine 4% Treatment Notes Electronic Signature(s) Signed: 03/31/2016 4:15:03 PM By: Montey Hora Entered By: Montey Hora on 03/28/2016 14:30:07 [name], [name] ([id number]) [name] ([id number]) -------------------------------------------------------------------------------- Multi-Disciplinary Care Plan Details Patient Name: [name], [name]. Date of Service: 03/28/2016 2:15 PM Medical Record Number: [id number] Patient Account Number: [id number] Date of Birth/Sex: 31-Oct-1940 (75 y.o. Female) Treating RN: Montey Hora Primary Care Physician: Glendale Chard Other Clinician: Referring Physician: Glendale Chard Treating Physician/Extender: Frann Rider in Treatment: 8 Active Inactive Abuse / Safety / Falls / Self Care Management Nursing Diagnoses: Impaired physical mobility Potential for falls Goals: Patient will remain injury free Date Initiated: 02/01/2016 Goal Status: Active Interventions: Assess fall risk on admission and as needed Notes: Orientation to the Wound Care Program Nursing Diagnoses: Knowledge deficit related to the wound healing center program Goals: Patient/caregiver will verbalize understanding of  the [facility] Program Date Initiated: 02/01/2016 Goal Status: Active Interventions: Provide education on orientation to the wound center Notes: Wound/Skin Impairment Nursing Diagnoses: Impaired tissue integrity Goals: Patient/caregiver will verbalize understanding of skin care regimen [name], [name] ([id number]) Date Initiated: 02/01/2016 Goal Status: Active Ulcer/skin breakdown will have a volume reduction of 30% by week 4 Date Initiated: 02/01/2016 Goal Status: Active Ulcer/skin breakdown will have a volume reduction of 50% by week 8 Date Initiated: 02/01/2016 Goal Status: Active Ulcer/skin breakdown will have a volume reduction of 80% by week 12 Date Initiated: 02/01/2016 Goal Status: Active Ulcer/skin breakdown will heal within 14 weeks Date Initiated: 02/01/2016 Goal Status: Active Interventions: Assess patient/caregiver ability to obtain necessary supplies Assess patient/caregiver ability to perform ulcer/skin care regimen upon admission and as needed Assess ulceration(s) every visit Notes: Electronic Signature(s) Signed: 03/31/2016 4:15:03 PM By: Montey Hora Entered By: Montey Hora on 03/28/2016 14:29:46 [name], [name] ([id number]) -------------------------------------------------------------------------------- Pain Assessment Details Patient Name: [name]. Date of Service: 03/28/2016 2:15 PM Medical Record Number: [id number] Patient Account Number: [id number] Date of Birth/Sex: [birth year] (75 y.o. Female) Treating RN: Montey Hora Primary Care Physician: Glendale Chard Other Clinician: Referring Physician: Glendale Chard Treating Physician/Extender: Frann Rider in Treatment: 8 Active Problems Location of Pain Severity and Description of Pain Patient Has Paino No Site Locations Pain Management and Medication Current Pain Management: Notes Topical or injectable lidocaine is offered to patient for acute pain when  surgical debridement is performed. If needed, Patient is instructed to use over the counter pain medication for the following 24-48 hours after debridement. Wound care MDs do not prescribed pain medications. Patient has chronic pain or uncontrolled pain. Patient has been instructed to make an appointment with their Primary Care Physician for pain management. Electronic Signature(s) Signed: 03/31/2016 4:15:03 PM By: Montey Hora Entered By: Montey Hora on 03/28/2016 14:16:16 [name], Joan EMarland Kitchen ([id number]) -------------------------------------------------------------------------------- Wound Assessment Details Patient Name: [name]. Date of Service: 03/28/2016 2:15 PM Medical Record Number: [id number] Patient Account Number: [id number] Date of Birth/Sex: 10-Jun-1940 (75 y.o. Female) Treating RN: Montey Hora Primary Care Physician: Glendale Chard Other Clinician: Referring Physician: Glendale Chard Treating Physician/Extender: Frann Rider in Treatment: 8 Wound Status Wound Number: 1 Primary Diabetic Wound/Ulcer of the Lower Etiology: Extremity Wound Location: Left Lower Leg - Lateral Secondary Auto-immune Wounding Event: Trauma Etiology: Date Acquired: 12/27/2015 Wound Open Weeks Of Treatment: 8 Status: Clustered Wound: No Comorbid Cataracts, Lymphedema, History: Hypertension, Type II Diabetes, Gout, Rheumatoid Arthritis, Osteoarthritis, Neuropathy Photos Wound Measurements Length: (cm) 3.2 Width: (  cm) 2.6 Depth: (cm) 0.4 Area: (cm) 6.535 Volume: (cm) 2.614 % Reduction in Area: 67% % Reduction in Volume: 34% Epithelialization: Medium (34-66%) Tunneling: No Undermining: No Wound Description Classification: Grade 2 Wound Margin: Distinct, outline attached Exudate Amount: Medium Exudate Type: Sanguinous Exudate Color: red Wound Bed Granulation Amount: Large (67-100%) Exposed Structure Granulation Quality: Red Fascia Exposed: No [name],  [name] ([id number]) Necrotic Amount: Small (1-33%) Fat Layer Exposed: No Necrotic Quality: Adherent Slough Tendon Exposed: No Muscle Exposed: No Joint Exposed: No Bone Exposed: No Limited to Skin Breakdown Periwound Skin Texture Texture Color No Abnormalities Noted: No No Abnormalities Noted: No Callus: No Atrophie Blanche: No Crepitus: No Cyanosis: No Excoriation: No Ecchymosis: Yes Fluctuance: No Erythema: No Friable: No Hemosiderin Staining: Yes Induration: No Mottled: No Localized Edema: Yes Pallor: No Rash: No Rubor: No Scarring: Yes Temperature / Pain Moisture Temperature: No Abnormality No Abnormalities Noted: No Tenderness on Palpation: Yes Dry / Scaly: No Maceration: No Moist: Yes Wound Preparation Ulcer Cleansing: Rinsed/Irrigated with Saline Topical Anesthetic Applied: Other: lidocaine 4%, Treatment Notes Wound #1 (Left, Lateral Lower Leg) 1. Cleansed with: Clean wound with Normal Saline 2. Anesthetic Topical Lidocaine 4% cream to wound bed prior to debridement 3. Peri-wound Care: Skin Prep 4. Dressing Applied: Hydrafera Blue 5. Secondary Dressing Applied Bordered Foam Dressing Dry Gauze Electronic Signature(s) Signed: 03/31/2016 4:15:03 PM By: Montey Hora Entered By: Montey Hora on 03/28/2016 14:29:34 [name] ([id number]) [name], [name] ([id number]) -------------------------------------------------------------------------------- Vitals Details Patient Name: [name]. Date of Service: 03/28/2016 2:15 PM Medical Record Number: [id number] Patient Account Number: [id number] Date of Birth/Sex: 04-07-1941 (75 y.o. Female) Treating RN: Montey Hora Primary Care Physician: Glendale Chard Other Clinician: Referring Physician: Glendale Chard Treating Physician/Extender: Frann Rider in Treatment: 8 Vital Signs Time Taken: 14:16 Temperature (F): 98.3 Height (in): 64 Pulse (bpm): 93 Weight (lbs):  283 Respiratory Rate (breaths/min): 18 Body Mass Index (BMI): 48.6 Blood Pressure (mmHg): 112/56 Reference Range: 80 - 120 mg / dl Electronic Signature(s) Signed: 03/31/2016 4:15:03 PM By: Montey Hora Entered By: Montey Hora on 03/28/2016 14:17:09

## 2016-03-31 NOTE — Progress Notes (Signed)
[name], [name] ([id number]) Visit Report for 03/28/2016 Chief Complaint Document Details Patient Name: [name], [name] 03/28/2016 2:15 Date of Service: PM Medical Record [id number] Number: Patient Account Number: [id number] September 02, 1940 (75 y.o. Treating RN: Montey Hora Date of Birth/Sex: Female) Other Clinician: Primary Care Physician: Glendale Chard Treating Christin Fudge Referring Physician: Glendale Chard Physician/Extender: Suella Grove [name]: 8 Information Obtained from: Patient Chief Complaint Patients presents for Mcdaniel of an open diabetic ulcer the left lower extremity which she's had for about 4 [name] Electronic Signature(s) Signed: 03/28/2016 3:08:24 PM By: Christin Fudge MD, FACS Entered By: Christin Fudge on 03/28/2016 15:08:23 [name] ([id number]) -------------------------------------------------------------------------------- Debridement Details Patient Name: [name]. 03/28/2016 2:15 Date of Service: PM Medical Record [id number] Number: Patient Account Number: [id number] [birth year] (75 y.o. Treating RN: Montey Hora Date of Birth/Sex: Female) Other Clinician: Primary Care Physician: Glendale Chard Treating Yoshiharu Brassell Referring Physician: Glendale Chard Physician/Extender: Suella Grove [name]: 8 Debridement Performed for Wound #1 Left,Lateral Lower Leg Assessment: Performed By: Physician Christin Fudge, MD Debridement: Debridement Pre-procedure Yes - 14:30 Verification/Time Out Taken: Start Time: 14:30 Pain Control: Lidocaine 4% Topical Solution Level: Skin/Subcutaneous Tissue Total Area Debrided (L x 3.2 (cm) x 2.6 (cm) = 8.32 (cm) W): Tissue and other Viable, Non-Viable, Fibrin/Slough, Subcutaneous material debrided: Instrument: Forceps Bleeding: Minimum End Time: 14:32 Procedural Pain: 0 Post Procedural Pain: 0 Response to Mcdaniel: Procedure was tolerated well Post Debridement Measurements of Total  Wound Length: (cm) 3.2 Width: (cm) 2.6 Depth: (cm) 0.4 Volume: (cm) 2.614 Character of Wound/Ulcer Post Improved Debridement: Severity of Tissue Post Debridement: Fat layer exposed Post Procedure Diagnosis Same as Pre-procedure Electronic Signature(s) Signed: 03/28/2016 3:08:18 PM By: Christin Fudge MD, FACS Signed: 03/31/2016 4:15:03 PM By: Montey Hora Entered By: Christin Fudge on 03/28/2016 15:08:18 [name] ([name], Edwena Felty ([id number]) -------------------------------------------------------------------------------- HPI Details Patient Name: [name], [name] 03/28/2016 2:15 Date of Service: PM Medical Record [id number] Number: Patient Account Number: [id number] [birth year] (75 y.o. Treating RN: Montey Hora Date of Birth/Sex: Female) Other Clinician: Primary Care Physician: Glendale Chard Treating Yadriel Kerrigan Referring Physician: Glendale Chard Physician/Extender: Suella Grove [name]: 8 History of Present Illness Location: left lower extremity lateral calf Quality: Patient reports experiencing a dull pain to affected area(s). Severity: Patient states wound are getting worse. Duration: Patient has had the wound for < 4 [name] prior to presenting for Mcdaniel Timing: Pain Joan wound is Intermittent (comes and goes Context: The wound occurred when the patient was injured with a sharp corner for car door Modifying Factors: Other Mcdaniel(s) tried include:local care and 3 courses of antibiotics including Keflex, Bactrim and doxycycline Associated Signs and Symptoms: Patient reports having increase swelling. HPI Description: 75 year old patient with a past medical history of diabetes mellitus, chronic kidney disease, COPD, hypertension, rheumatoid arthritis had a lacerated wound to her left calf when she injured it on the corner of a car door and it was a long 12 cm laceration. x-ray done at the time revealed that there was no fracture or  radiopaque foreign body. the wound was sutured with 4-0 Prolene simple interrupted sutures and a total of 14 sutures were applied. on August 31 she was seen Joan the ER and it was noted that the wound had dehisced and she was treated with Keflex. She has completed a course of doxycycline recently Medical history also significant for status post abdominal hysterectomy, brain surgery, cardiac catheterization, EGD and bronchoscopy. of late she was back Joan the ER and was seen last on August  31 and they tried Silvadene 02/22/16: pt reports discomfort and mild pain just below her wound. however, she admits on legs more than usual. no redness, foul odor or purulence. the wound is measuring slightly larger today, but likely secondary to poor edema control. nursing staff gave information to pt today regarding purchase of her compression stockings. she was encouraged to purchase soon. 02/29/16: returns today for f/u. she denies systemic s/s of infection. she brought her new compression stockings today. 03/21/2016 -- She has been put on an antibiotic and prednisone for a bronchitis by her pulmonologist. Electronic Signature(s) Signed: 03/28/2016 3:08:34 PM By: Christin Fudge MD, FACS Entered By: Christin Fudge on 03/28/2016 15:08:34 [name] ([id number]) -------------------------------------------------------------------------------- Physical Exam Details Patient Name: [name], [name] 03/28/2016 2:15 Date of Service: PM Medical Record [id number] Number: Patient Account Number: [id number] 02-11-1941 (75 y.o. Treating RN: Montey Hora Date of Birth/Sex: Female) Other Clinician: Primary Care Physician: Glendale Chard Treating Vernor Monnig Referring Physician: Glendale Chard Physician/Extender: [name] [name]: 8 Constitutional . Pulse regular. Respirations normal and unlabored. Afebrile. . Eyes Nonicteric. Reactive to light. Ears, Nose, Mouth, and Throat Lips, teeth, and gums  WNL.[name] Moist mucosa without lesions. Neck supple and nontender. No palpable supraclavicular or cervical adenopathy. Normal sized without goiter. Respiratory WNL. No retractions.. Breath sounds WNL, No rubs, rales, rhonchi, or wheeze.. Cardiovascular Heart rhythm and rate regular, no murmur or Mcdaniel.. Pedal Pulses WNL. No clubbing, cyanosis or edema. Chest Breasts symmetical and no nipple discharge.. Breast tissue WNL, no masses, lumps, or tenderness.. Lymphatic No adneopathy. No adenopathy. No adenopathy. Musculoskeletal Adexa without tenderness or enlargement.. Digits and nails w/o clubbing, cyanosis, infection, petechiae, ischemia, or inflammatory conditions.. Integumentary (Hair, Skin) No suspicious lesions. No crepitus or fluctuance. No peri-wound warmth or erythema. No masses.[name] Psychiatric Judgement and insight Intact.. No evidence of depression, anxiety, or agitation.. Notes the wound is overall looking much better and the hyperventilation tissue has has decreased and the bases looking healthy. There are a few areas of necrotic debris with depth and I have sharply removed it with a toothed forcep and moist saline gauze. Minimal bleeding. Electronic Signature(s) Signed: 03/28/2016 3:09:13 PM By: Christin Fudge MD, FACS Entered By: Christin Fudge on 03/28/2016 15:09:12 [name] ([id number]) -------------------------------------------------------------------------------- Physician Orders Details Patient Name: [name], [name] 03/28/2016 2:15 Date of Service: PM Medical Record [id number] Number: Patient Account Number: [id number] [birth year] (75 y.o. Treating RN: Montey Hora Date of Birth/Sex: Female) Other Clinician: Primary Care Physician: Glendale Chard Treating Aailyah Dunbar Referring Physician: Glendale Chard Physician/Extender: Suella Grove [name]: 8 Verbal / Phone Orders: Yes Clinician: Montey Hora Read Back and Verified: Yes Diagnosis Coding Wound  Cleansing Wound #1 Left,Lateral Lower Leg o Clean wound with Normal Saline. o May Shower, gently pat wound dry prior to applying new dressing. Anesthetic Wound #1 Left,Lateral Lower Leg o Topical Lidocaine 4% cream applied to wound bed prior to debridement Skin Barriers/Peri-Wound Care Wound #1 Left,Lateral Lower Leg o Skin Prep Primary Wound Dressing Wound #1 Left,Lateral Lower Leg o Hydrafera Blue Secondary Dressing Wound #1 Left,Lateral Lower Leg o Dry Gauze o Boardered Foam Dressing Dressing Change Frequency Wound #1 Left,Lateral Lower Leg o Change dressing every day. Follow-up Appointments Wound #1 Left,Lateral Lower Leg o Return Appointment Joan 1 week. Edema Control Wound #1 Left,Lateral Lower Leg o Patient to wear own compression stockings [name], [name] ([id number]) Home Health Wound #1 Left,Lateral Lower Leg o [place] Visits - [place] [facility] - [facility] to visit patient 2 times weekly, family  to change dressing all other days o Home Health Nurse may visit PRN to address patientos wound care needs. o FACE TO FACE ENCOUNTER: MEDICARE and MEDICAID PATIENTS: I certify that this patient is under my care and that I had a face-to-face encounter that meets the physician face-to-face encounter requirements with this patient on this date. The encounter with the patient was Joan whole or Joan part for the following MEDICAL CONDITION: (primary reason for [place]) MEDICAL NECESSITY: I certify, that based on my findings, NURSING services are a medically necessary home health service. HOME BOUND STATUS: I certify that my clinical findings support that this patient is homebound (i.e., Due to illness or injury, pt requires aid of supportive devices such as crutches, cane, wheelchairs, walkers, the use of special transportation or the assistance of another person to leave their place of residence. There is a normal inability to leave the home and  doing so requires considerable and taxing effort. Other absences are for medical reasons / religious services and are infrequent or of short duration when for other reasons). o If current dressing causes regression Joan wound condition, may D/C ordered dressing product/s and apply Normal Saline Moist Dressing daily until next [place] / Other MD appointment. [place] of regression Joan wound condition at [phone]. o Please direct any NON-WOUND related issues/requests for orders to patient's Primary Care Physician Electronic Signature(s) Signed: 03/28/2016 4:13:44 PM By: Christin Fudge MD, FACS Signed: 03/31/2016 4:15:03 PM By: Montey Hora Entered By: Montey Hora on 03/28/2016 14:32:34 [name] ([id number]) -------------------------------------------------------------------------------- Problem List Details Patient Name: [name], [name] 03/28/2016 2:15 Date of Service: PM Medical Record [id number] Number: Patient Account Number: [id number] [birth year] (75 y.o. Treating RN: Montey Hora Date of Birth/Sex: Female) Other Clinician: Primary Care Physician: Glendale Chard Treating Christin Fudge Referring Physician: Glendale Chard Physician/Extender: [name] [name]: 8 Active Problems ICD-10 Encounter Code Description Active Date Diagnosis E11.622 Type 2 diabetes mellitus with other skin ulcer 02/01/2016 Yes L97.222 Non-pressure chronic ulcer of left calf with fat layer 02/01/2016 Yes exposed I89.0 Lymphedema, not elsewhere classified 02/01/2016 Yes Inactive Problems Resolved Problems Electronic Signature(s) Signed: 03/28/2016 3:08:10 PM By: Christin Fudge MD, FACS Entered By: Christin Fudge on 03/28/2016 15:08:10 [name] ([id number]) -------------------------------------------------------------------------------- Progress Note Details Patient Name: [name] 03/28/2016 2:15 Date of Service: PM Medical  Record [id number] Number: Patient Account Number: [id number] 1941-02-17 (75 y.o. Treating RN: Montey Hora Date of Birth/Sex: Female) Other Clinician: Primary Care Physician: Glendale Chard Treating Amonie Wisser Referring Physician: Glendale Chard Physician/Extender: Suella Grove [name]: 8 Subjective Chief Complaint Information obtained from Patient Patients presents for Mcdaniel of an open diabetic ulcer the left lower extremity which she's had for about 4 [name] History of Present Illness (HPI) The following HPI elements were documented for the patient's wound: Location: left lower extremity lateral calf Quality: Patient reports experiencing a dull pain to affected area(s). Severity: Patient states wound are getting worse. Duration: Patient has had the wound for < 4 [name] prior to presenting for Mcdaniel Timing: Pain Joan wound is Intermittent (comes and goes Context: The wound occurred when the patient was injured with a sharp corner for car door Modifying Factors: Other Mcdaniel(s) tried include:local care and 3 courses of antibiotics including Keflex, Bactrim and doxycycline Associated Signs and Symptoms: Patient reports having increase swelling. 75 year old patient with a past medical history of diabetes mellitus, chronic kidney disease, COPD, hypertension, rheumatoid arthritis had a lacerated wound to her left calf when she injured  it on the corner of a car door and it was a long 12 cm laceration. x-ray done at the time revealed that there was no fracture or radiopaque foreign body. the wound was sutured with 4-0 Prolene simple interrupted sutures and a total of 14 sutures were applied. on August 31 she was seen Joan the ER and it was noted that the wound had dehisced and she was treated with Keflex. She has completed a course of doxycycline recently Medical history also significant for status post abdominal hysterectomy, brain surgery, cardiac catheterization, EGD and  bronchoscopy. of late she was back Joan the ER and was seen last on August 31 and they tried Silvadene 02/22/16: pt reports discomfort and mild pain just below her wound. however, she admits on legs more than usual. no redness, foul odor or purulence. the wound is measuring slightly larger today, but likely secondary to poor edema control. nursing staff gave information to pt today regarding purchase of her compression stockings. she was encouraged to purchase soon. 02/29/16: returns today for f/u. she denies systemic s/s of infection. she brought her new compression stockings today. 03/21/2016 -- She has been put on an antibiotic and prednisone for a bronchitis by her pulmonologist. [name] ([id number]) Objective Constitutional Pulse regular. Respirations normal and unlabored. Afebrile. Vitals Time Taken: 2:16 PM, Height: 64 Joan, Weight: 283 lbs, BMI: 48.6, Temperature: 98.3 F, Pulse: 93 bpm, Respiratory Rate: 18 breaths/min, Blood Pressure: 112/56 mmHg. Eyes Nonicteric. Reactive to light. Ears, Nose, Mouth, and Throat Lips, teeth, and gums WNL.[name] Moist mucosa without lesions. Neck supple and nontender. No palpable supraclavicular or cervical adenopathy. Normal sized without goiter. Respiratory WNL. No retractions.. Breath sounds WNL, No rubs, rales, rhonchi, or wheeze.. Cardiovascular Heart rhythm and rate regular, no murmur or Mcdaniel.. Pedal Pulses WNL. No clubbing, cyanosis or edema. Chest Breasts symmetical and no nipple discharge.. Breast tissue WNL, no masses, lumps, or tenderness.. Lymphatic No adneopathy. No adenopathy. No adenopathy. Musculoskeletal Adexa without tenderness or enlargement.. Digits and nails w/o clubbing, cyanosis, infection, petechiae, ischemia, or inflammatory conditions.[name] Psychiatric Judgement and insight Intact.. No evidence of depression, anxiety, or agitation.. General Notes: the wound is overall looking much better and the hyperventilation  tissue has has decreased and the bases looking healthy. There are a few areas of necrotic debris with depth and I have sharply removed it with a toothed forcep and moist saline gauze. Minimal bleeding. Integumentary (Hair, Skin) No suspicious lesions. No crepitus or fluctuance. No peri-wound warmth or erythema. No masses.[name] [name], [name] ([id number]) Wound #1 status is Open. Original cause of wound was Trauma. The wound is located on the Left,Lateral Lower Leg. The wound measures 3.2cm length x 2.6cm width x 0.4cm depth; 6.535cm^2 area and 2.614cm^3 volume. The wound is limited to skin breakdown. There is no tunneling or undermining noted. There is a medium amount of sanguinous drainage noted. The wound margin is distinct with the outline attached to the wound base. There is large (67-100%) red granulation within the wound bed. There is a small (1-33%) amount of necrotic tissue within the wound bed including Adherent Slough. The periwound skin appearance exhibited: Localized Edema, Scarring, Moist, Ecchymosis, Hemosiderin Staining. The periwound skin appearance did not exhibit: Callus, Crepitus, Excoriation, Fluctuance, Friable, Induration, Rash, Dry/Scaly, Maceration, Atrophie Blanche, Cyanosis, Mottled, Pallor, Rubor, Erythema. Periwound temperature was noted as No Abnormality. The periwound has tenderness on palpation. Assessment Active Problems ICD-10 E11.622 - Type 2 diabetes mellitus with other skin ulcer L97.222 - Non-pressure chronic ulcer  of left calf with fat layer exposed I89.0 - Lymphedema, not elsewhere classified Procedures Wound #1 Wound #1 is a Diabetic Wound/Ulcer of the Lower Extremity located on the Left,Lateral Lower Leg . There was a Skin/Subcutaneous Tissue Debridement ([id number]) debridement with total area of 8.32 sq cm performed by Christin Fudge, MD. with the following instrument(s): Forceps to remove Viable and Non-Viable tissue/material including  Fibrin/Slough and Subcutaneous after achieving pain control using Lidocaine 4% Topical Solution. A time out was conducted at 14:30, prior to the start of the procedure. There was a Minimum amount of bleeding. The procedure was tolerated well with a pain level of 0 throughout and a pain level of 0 following the procedure. Post Debridement Measurements: 3.2cm length x 2.6cm width x 0.4cm depth; 2.614cm^3 volume. Character of Wound/Ulcer Post Debridement is improved. Severity of Tissue Post Debridement is: Fat layer exposed. Post procedure Diagnosis Wound #1: Same as Pre-Procedure [name], [name]. ([id number]) Plan Wound Cleansing: Wound #1 Left,Lateral Lower Leg: Clean wound with Normal Saline. May Shower, gently pat wound dry prior to applying new dressing. Anesthetic: Wound #1 Left,Lateral Lower Leg: Topical Lidocaine 4% cream applied to wound bed prior to debridement Skin Barriers/Peri-Wound Care: Wound #1 Left,Lateral Lower Leg: Skin Prep Primary Wound Dressing: Wound #1 Left,Lateral Lower Leg: Hydrafera Blue Secondary Dressing: Wound #1 Left,Lateral Lower Leg: Dry Gauze Boardered Foam Dressing Dressing Change Frequency: Wound #1 Left,Lateral Lower Leg: Change dressing every day. Follow-up Appointments: Wound #1 Left,Lateral Lower Leg: Return Appointment Joan 1 week. Edema Control: Wound #1 Left,Lateral Lower Leg: Patient to wear own compression stockings Home Health: Wound #1 Left,Lateral Lower Leg: Continue Home Health Visits - [facility] Go [facility] to visit patient 2 times weekly, family to change dressing all other days Home Health Nurse may visit PRN to address patient s wound care needs. FACE TO FACE ENCOUNTER: MEDICARE and MEDICAID PATIENTS: I certify that this patient is under my care and that I had a face-to-face encounter that meets the physician face-to-face encounter requirements with this patient on this date. The encounter with the patient was Joan whole or Joan part  for the following MEDICAL CONDITION: (primary reason for [place]) MEDICAL NECESSITY: I certify, that based on my findings, NURSING services are a medically necessary home health service. HOME BOUND STATUS: I certify that my clinical findings support that this patient is homebound (i.e., Due to illness or injury, pt requires aid of supportive devices such as crutches, cane, wheelchairs, walkers, the use of special transportation or the assistance of another person to leave their place of residence. There is a normal inability to leave the home and doing so requires considerable and taxing effort. Other absences are for medical reasons / religious services and are infrequent or of short duration when for other reasons). If current dressing causes regression Joan wound condition, may D/C ordered dressing product/s and apply Normal Saline Moist Dressing daily until next [place] / Other MD appointment. [place] of regression Joan wound condition at [phone]. Please direct any NON-WOUND related issues/requests for orders to patient's Primary Care Physician [name], POPESCU ([id number]) I have recommended we continue with [facility] and we should continue her local compression. I have told her to aggressively elevate and exercise to reduce her lymphedema. Electronic Signature(s) Signed: 03/28/2016 3:09:47 PM By: Christin Fudge MD, FACS Entered By: Christin Fudge on 03/28/2016 15:09:47 [name] ([id number]) -------------------------------------------------------------------------------- SuperBill Details Patient Name: [name]. Date of Service: 03/28/2016 Medical Record Number: [id number] Patient  Account Number: [id number] Date of Birth/Sex: Jun 20, 1940 (75 y.o. Female) Treating RN: Montey Hora Primary Care Physician: Glendale Chard Other Clinician: Referring Physician: Glendale Chard Treating Physician/Extender: Frann Rider [name]: 8 Diagnosis Coding ICD-10 Codes Code Description E11.622 Type 2 diabetes mellitus with other skin ulcer L97.222 Non-pressure chronic ulcer of left calf with fat layer exposed I89.0 Lymphedema, not elsewhere classified Facility Procedures CPT4 Code: [id number] Description: 95621 - DEB SUBQ TISSUE 20 SQ CM/< ICD-10 Description Diagnosis E11.622 Type 2 diabetes mellitus with other skin ulcer L97.222 Non-pressure chronic ulcer of left calf with fat l I89.0 Lymphedema, not elsewhere classified Modifier: ayer exposed Quantity: 1 Physician Procedures CPT4 Code: 3086578 Description: 46962 - WC PHYS SUBQ TISS 20 SQ CM ICD-10 Description Diagnosis E11.622 Type 2 diabetes mellitus with other skin ulcer L97.222 Non-pressure chronic ulcer of left calf with fat l I89.0 Lymphedema, not elsewhere classified Modifier: ayer exposed Quantity: 1 Electronic Signature(s) Signed: 03/28/2016 3:09:56 PM By: Christin Fudge MD, FACS Entered By: Christin Fudge on 03/28/2016 15:09:56

## 2016-04-01 ENCOUNTER — Encounter: Payer: Medicare Other | Attending: Physical Medicine & Rehabilitation | Admitting: Registered Nurse

## 2016-04-01 ENCOUNTER — Encounter: Payer: Self-pay | Admitting: Registered Nurse

## 2016-04-01 ENCOUNTER — Ambulatory Visit: Payer: Medicare Other | Admitting: Physical Medicine & Rehabilitation

## 2016-04-01 VITALS — BP 95/64 | HR 94 | Resp 14

## 2016-04-01 DIAGNOSIS — M069 Rheumatoid arthritis, unspecified: Secondary | ICD-10-CM | POA: Diagnosis not present

## 2016-04-01 DIAGNOSIS — Z9889 Other specified postprocedural states: Secondary | ICD-10-CM | POA: Insufficient documentation

## 2016-04-01 DIAGNOSIS — E079 Disorder of thyroid, unspecified: Secondary | ICD-10-CM | POA: Insufficient documentation

## 2016-04-01 DIAGNOSIS — Z79899 Other long term (current) drug therapy: Secondary | ICD-10-CM

## 2016-04-01 DIAGNOSIS — M62838 Other muscle spasm: Secondary | ICD-10-CM | POA: Insufficient documentation

## 2016-04-01 DIAGNOSIS — E1140 Type 2 diabetes mellitus with diabetic neuropathy, unspecified: Secondary | ICD-10-CM | POA: Diagnosis not present

## 2016-04-01 DIAGNOSIS — J8410 Pulmonary fibrosis, unspecified: Secondary | ICD-10-CM | POA: Insufficient documentation

## 2016-04-01 DIAGNOSIS — M1711 Unilateral primary osteoarthritis, right knee: Secondary | ICD-10-CM | POA: Diagnosis not present

## 2016-04-01 DIAGNOSIS — Z833 Family history of diabetes mellitus: Secondary | ICD-10-CM | POA: Diagnosis not present

## 2016-04-01 DIAGNOSIS — M1712 Unilateral primary osteoarthritis, left knee: Secondary | ICD-10-CM | POA: Diagnosis not present

## 2016-04-01 DIAGNOSIS — F329 Major depressive disorder, single episode, unspecified: Secondary | ICD-10-CM | POA: Diagnosis not present

## 2016-04-01 DIAGNOSIS — E1142 Type 2 diabetes mellitus with diabetic polyneuropathy: Secondary | ICD-10-CM | POA: Diagnosis not present

## 2016-04-01 DIAGNOSIS — Z8051 Family history of malignant neoplasm of kidney: Secondary | ICD-10-CM | POA: Insufficient documentation

## 2016-04-01 DIAGNOSIS — I129 Hypertensive chronic kidney disease with stage 1 through stage 4 chronic kidney disease, or unspecified chronic kidney disease: Secondary | ICD-10-CM | POA: Insufficient documentation

## 2016-04-01 DIAGNOSIS — M545 Low back pain: Secondary | ICD-10-CM | POA: Diagnosis not present

## 2016-04-01 DIAGNOSIS — E7800 Pure hypercholesterolemia, unspecified: Secondary | ICD-10-CM | POA: Diagnosis not present

## 2016-04-01 DIAGNOSIS — G894 Chronic pain syndrome: Secondary | ICD-10-CM | POA: Diagnosis not present

## 2016-04-01 DIAGNOSIS — J449 Chronic obstructive pulmonary disease, unspecified: Secondary | ICD-10-CM | POA: Insufficient documentation

## 2016-04-01 DIAGNOSIS — Z808 Family history of malignant neoplasm of other organs or systems: Secondary | ICD-10-CM | POA: Insufficient documentation

## 2016-04-01 DIAGNOSIS — Z801 Family history of malignant neoplasm of trachea, bronchus and lung: Secondary | ICD-10-CM | POA: Diagnosis not present

## 2016-04-01 DIAGNOSIS — Z8249 Family history of ischemic heart disease and other diseases of the circulatory system: Secondary | ICD-10-CM | POA: Diagnosis not present

## 2016-04-01 DIAGNOSIS — M1288 Other specific arthropathies, not elsewhere classified, other specified site: Secondary | ICD-10-CM

## 2016-04-01 DIAGNOSIS — D689 Coagulation defect, unspecified: Secondary | ICD-10-CM | POA: Insufficient documentation

## 2016-04-01 DIAGNOSIS — Z5181 Encounter for therapeutic drug level monitoring: Secondary | ICD-10-CM

## 2016-04-01 DIAGNOSIS — E1122 Type 2 diabetes mellitus with diabetic chronic kidney disease: Secondary | ICD-10-CM | POA: Diagnosis not present

## 2016-04-01 DIAGNOSIS — M170 Bilateral primary osteoarthritis of knee: Secondary | ICD-10-CM | POA: Insufficient documentation

## 2016-04-01 DIAGNOSIS — N189 Chronic kidney disease, unspecified: Secondary | ICD-10-CM | POA: Insufficient documentation

## 2016-04-01 DIAGNOSIS — M7062 Trochanteric bursitis, left hip: Secondary | ICD-10-CM | POA: Insufficient documentation

## 2016-04-01 DIAGNOSIS — R200 Anesthesia of skin: Secondary | ICD-10-CM | POA: Diagnosis not present

## 2016-04-01 DIAGNOSIS — M47816 Spondylosis without myelopathy or radiculopathy, lumbar region: Secondary | ICD-10-CM

## 2016-04-01 DIAGNOSIS — M48062 Spinal stenosis, lumbar region with neurogenic claudication: Secondary | ICD-10-CM | POA: Diagnosis not present

## 2016-04-01 DIAGNOSIS — Z803 Family history of malignant neoplasm of breast: Secondary | ICD-10-CM | POA: Insufficient documentation

## 2016-04-01 MED ORDER — MORPHINE SULFATE ER 15 MG PO TBCR: 15.0000 mg | EXTENDED_RELEASE_TABLET | Freq: Two times a day (BID) | ORAL | 0 refills | Status: DC

## 2016-04-01 MED ORDER — DICLOFENAC SODIUM 1 % TD GEL: 1.0000 "application " | Freq: Three times a day (TID) | TRANSDERMAL | 4 refills | Status: DC

## 2016-04-01 NOTE — Progress Notes (Signed)
Subjective:    Patient ID: [name], female    DOB: [birth year], 75 y.o.   MRN: [id number]  HPI: [name] is a 75 year old female who returns for follow up appointment and medication refill. She states her pain is located in her bilateral shoulder's, upper- lower back and bilateral knee pain. She ratesher pain 4. Her current exercise regime is performing chair exercises and walking.  Daughter in room.  Receiving Wound care from the [facility] changing her left lower extremity dressing daily.   Pain Inventory Average Pain 6 Pain Right Now 4 My pain is sharp and stabbing  In the last 24 hours, has pain interfered with the following? General activity 8 Relation with others 8 Enjoyment of life 8 What TIME of day is your pain at its worst? morning Sleep (in general) Fair  Pain is worse with: walking, bending and standing Pain improves with: heat/ice Relief from Meds: n/a  Mobility use a cane use a walker ability to climb steps?  no do you drive?  no transfers alone Do you have any goals in this area?  no  Function retired Do you have any goals in this area?  no  Neuro/Psych bladder control problems  Prior Studies Any changes since last visit?  yes  Physicians involved in your care Any changes since last visit?  no   Family History  Problem Relation Age of Onset  . Diabetes Mother   . Heart attack Mother   . Hypertension Father   . Lung cancer Father   . Diabetes Sister   . Diabetes Brother   . Hypertension Brother   . Heart disease Brother   . Heart attack Brother   . Kidney cancer Brother   . Uterine cancer Daughter   . Breast cancer Sister   . Rheum arthritis Maternal Uncle   . Gout Brother   . Kidney failure Brother     x 5   Social History   Social History  . Marital status: Married    Spouse name: N/A  . Number of children: 6  . Years of education: college   Occupational  History  . retired Marine scientist    Social History Main Topics  . Smoking status: Never Smoker  . Smokeless tobacco: Never Used  . Alcohol use No  . Drug use: No  . Sexual activity: Not Currently   Other Topics Concern  . None   Social History Narrative   Patient consumes 2-3 cups coffee per day.   Past Surgical History:  Procedure Laterality Date  . ABDOMINAL HYSTERECTOMY    . BRAIN SURGERY     Gamma knife 10/13. Needs repeat spring  '14  . CARDIAC CATHETERIZATION N/A 10/31/2014   Procedure: Right/Left Heart Cath and Coronary Angiography;  Surgeon: [name], MD;  Location: [facility];  Service: Cardiovascular;  Laterality: N/A;  . ESOPHAGOGASTRODUODENOSCOPY (EGD) WITH PROPOFOL N/A 09/14/2014   Procedure: ESOPHAGOGASTRODUODENOSCOPY (EGD) WITH PROPOFOL;  Surgeon: [name], MD;  Location: [facility];  Service: Endoscopy;  Laterality: N/A;  . OVARY SURGERY    . SHOULDER SURGERY Left   . TONSILLECTOMY  age 77  . VIDEO BRONCHOSCOPY Bilateral 05/31/2013   Procedure: VIDEO BRONCHOSCOPY WITHOUT FLUORO;  Surgeon: Brand Males, MD;  Location: [facility];  Service: Cardiopulmonary;  Laterality: Bilateral;  . video bronscoscopy  2000   lung   Past Medical History:  Diagnosis Date  .  Arthritis    endstage changes bilateral knees/bilateral ankles.   . Asthma   . Carotid artery occlusion   . Chronic kidney disease   . Clotting disorder ([place])    pt denies this  . Contusion of left knee    due to fall 1/14.  [name] COPD (chronic obstructive pulmonary disease) (HCC)    pulmonary fibrosis  . Depression, reactive   . Diabetes mellitus   . Diastolic dysfunction   . Family history of heart disease   . High cholesterol   . Hypertension   . Interstitial lung disease ([place])   . 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    . Morbid obesity ([place])   . Normal coronary arteries     cardiac catheterization performed  10/31/14  . RA (rheumatoid arthritis) ([place])    has been off methotreaxte since 10/13.  [name] Shortness of breath   . Thyroid disease   . URI (upper respiratory infection)    BP 95/64   Pulse 94   Resp 14   SpO2 95%   Opioid Risk Score:   Fall Risk Score:  `1  Depression screen PHQ 2/9  Depression screen [facility] 2/9 08/09/2015 01/05/2015 09/20/2014 08/10/2013  Decreased Interest _0 -  Down, Depressed, Hopeless _1 PHQ - 2 Score _2 Altered sleeping - 2 0 -  Tired, decreased energy - 2 3 -  Change in appetite - 2 3 -  Feeling bad or failure about yourself  - 0 3 -  Trouble concentrating - 1 1 -  Moving slowly or fidgety/restless - 0 2 -  Suicidal thoughts - 0 0 -  PHQ-9 Score - 10 16 -  Difficult doing work/chores - Somewhat difficult - -  Some recent data might be hidden     Review of Systems  Constitutional: Negative.   HENT: Negative.   Eyes: Negative.   Respiratory:       Respiratory infection   Cardiovascular: Negative.   Gastrointestinal: Negative.   Endocrine: Negative.   Genitourinary: Negative.   Musculoskeletal: Negative.   Skin: Negative.   Allergic/Immunologic: Negative.   Neurological: Negative.   Hematological: Negative.   Psychiatric/Behavioral: Negative.   All other systems reviewed and are negative.      Objective:   Physical Exam  Constitutional: She is oriented to person, place, and time.  HENT:  Head: Normocephalic and atraumatic.  Neck: Normal range of motion. Neck supple.  Cardiovascular: Normal rate and regular rhythm.   Pulmonary/Chest: Effort normal and breath sounds normal.  Musculoskeletal:  Normal Muscle Bulk and Muscle Testing Reveals: Upper Extremities: Full ROM and Muscle Strength 5/5 Bilateral AC Joint Tenderness R>L Thoracic Paraspinal Tenderness: T-1-T-3 T-7-T-9 Lumbar Paraspinal Tenderness: L-4-L-5 Lower Extremities: Full ROM and Muscle Strength 5/5 Left Lower Extremity Flexion  Produces Pain into Patella Arises from Table slowly using walker for support Narrow Based Gait    Neurological: She is alert and oriented to person, place, and time.  Skin: Skin is warm and dry.  Psychiatric: She has a normal mood and affect.  Nursing note and vitals reviewed.         Assessment & Plan:  1. Lumbar spinal stenosis with neurogenic claudication. Associated facet arthropathy: Continue Gabapentinand HEP as tolerated. Refilled : MS Contin 15 mg one tablet every 12 hours #60 andcontinueMSIR 15 mg 1 one tablet every 8hours as needed #  90 no script given. We will continue the opioid monitoring program, this consists of regular clinic visits, examinations, urine drug screen, pill counts as well as use of [place] Controlled Substance reporting System. 2. Depression: Continue Cymbalta 3. Diabetes mellitus type 2 with polyneuropathy: Continue Gabapentin 4. Rheumatoid arthritis and osteoarthritis. Contnue Voltaren Gel 5. Interstitial lung disease: Continuous Oxygen and Pulmonology Following.   6. Bilateral Osteoarthritis Knee's: Continue Voltaren Gel 7. Muscle Spasm: Continue : Flexeril  30 minutes of face to face patient care time was spent during this visit. All questions were encouraged and answered.  F/U in 1 month

## 2016-04-02 DIAGNOSIS — N182 Chronic kidney disease, stage 2 (mild): Secondary | ICD-10-CM | POA: Diagnosis not present

## 2016-04-02 DIAGNOSIS — S81812D Laceration without foreign body, left lower leg, subsequent encounter: Secondary | ICD-10-CM | POA: Diagnosis not present

## 2016-04-02 DIAGNOSIS — I130 Hypertensive heart and chronic kidney disease with heart failure and stage 1 through stage 4 chronic kidney disease, or unspecified chronic kidney disease: Secondary | ICD-10-CM | POA: Diagnosis not present

## 2016-04-02 DIAGNOSIS — T798XXD Other early complications of trauma, subsequent encounter: Secondary | ICD-10-CM | POA: Diagnosis not present

## 2016-04-02 DIAGNOSIS — E1122 Type 2 diabetes mellitus with diabetic chronic kidney disease: Secondary | ICD-10-CM | POA: Diagnosis not present

## 2016-04-02 DIAGNOSIS — I5032 Chronic diastolic (congestive) heart failure: Secondary | ICD-10-CM | POA: Diagnosis not present

## 2016-04-04 ENCOUNTER — Encounter: Payer: Medicare Other | Admitting: Surgery

## 2016-04-04 DIAGNOSIS — I890 Lymphedema, not elsewhere classified: Secondary | ICD-10-CM | POA: Diagnosis not present

## 2016-04-04 DIAGNOSIS — L97222 Non-pressure chronic ulcer of left calf with fat layer exposed: Secondary | ICD-10-CM | POA: Diagnosis not present

## 2016-04-04 DIAGNOSIS — J449 Chronic obstructive pulmonary disease, unspecified: Secondary | ICD-10-CM | POA: Diagnosis not present

## 2016-04-04 DIAGNOSIS — E11622 Type 2 diabetes mellitus with other skin ulcer: Secondary | ICD-10-CM | POA: Diagnosis not present

## 2016-04-04 DIAGNOSIS — N189 Chronic kidney disease, unspecified: Secondary | ICD-10-CM | POA: Diagnosis not present

## 2016-04-04 DIAGNOSIS — E1122 Type 2 diabetes mellitus with diabetic chronic kidney disease: Secondary | ICD-10-CM | POA: Diagnosis not present

## 2016-04-05 NOTE — Progress Notes (Addendum)
[name], [name] ([id number]) Visit Report for 04/04/2016 Chief Complaint Document Details Patient Name: [name] 04/04/2016 1:30 Date of Service: PM Medical Record [id number] Number: Patient Account Number: [id number] 07-14-1940 (75 y.o. Treating RN: Montey Hora Date of Birth/Sex: Female) Other Clinician: Primary Care Physician: Glendale Chard Treating Christin Fudge Referring Physician: Glendale Chard Physician/Extender: Suella Grove in Treatment: 9 Information Obtained from: Patient Chief Complaint Patients presents for treatment of an open diabetic ulcer the left lower extremity which she's had for about 4 weeks Electronic Signature(s) Signed: 04/04/2016 2:18:32 PM By: Christin Fudge MD, FACS Entered By: Christin Fudge on 04/04/2016 14:18:32 [name] ([id number]) -------------------------------------------------------------------------------- HPI Details Patient Name: [name]. 04/04/2016 1:30 Date of Service: PM Medical Record [id number] Number: Patient Account Number: [id number] 08-Nov-1940 (75 y.o. Treating RN: Montey Hora Date of Birth/Sex: Female) Other Clinician: Primary Care Physician: Glendale Chard Treating Catrice Zuleta Referring Physician: Glendale Chard Physician/Extender: Weeks in Treatment: 9 History of Present Illness Location: left lower extremity lateral calf Quality: Patient reports experiencing a dull pain to affected area(s). Severity: Patient states wound are getting worse. Duration: Patient has had the wound for < 4 weeks prior to presenting for treatment Timing: Pain in wound is Intermittent (comes and goes Context: The wound occurred when the patient was injured with a sharp corner for car door Modifying Factors: Other treatment(s) tried include:local care and 3 courses of antibiotics including Keflex, Bactrim and doxycycline Associated Signs and Symptoms: Patient reports having increase swelling. HPI Description:  75 year old patient with a past medical history of diabetes mellitus, chronic kidney disease, COPD, hypertension, rheumatoid arthritis had a lacerated wound to her left calf when she injured it on the corner of a car door and it was a long 12 cm laceration. x-ray done at the time revealed that there was no fracture or radiopaque foreign body. the wound was sutured with 4-0 Prolene simple interrupted sutures and a total of 14 sutures were applied. on August 31 she was seen in the ER and it was noted that the wound had dehisced and she was treated with Keflex. She has completed a course of doxycycline recently Medical history also significant for status post abdominal hysterectomy, brain surgery, cardiac catheterization, EGD and bronchoscopy. of late she was back in the ER and was seen last on August 31 and they tried Silvadene 02/22/16: pt reports discomfort and mild pain just below her wound. however, she admits on legs more than usual. no redness, foul odor or purulence. the wound is measuring slightly larger today, but likely secondary to poor edema control. nursing staff gave information to pt today regarding purchase of her compression stockings. she was encouraged to purchase soon. 02/29/16: returns today for f/u. she denies systemic s/s of infection. she brought her new compression stockings today. 03/21/2016 -- She has been put on an antibiotic and prednisone for a bronchitis by her pulmonologist. 04/04/2016 -- she continues to be on prednisone. Electronic Signature(s) Signed: 04/04/2016 2:19:10 PM By: Christin Fudge MD, FACS Entered By: Christin Fudge on 04/04/2016 14:19:10 [name] ([id number]) -------------------------------------------------------------------------------- Physical Exam Details Patient Name: [name], [name] 04/04/2016 1:30 Date of Service: PM Medical Record [id number] Number: Patient Account Number: [id number] 1940/06/06 (75 y.o. Treating RN: Montey Hora Date of Birth/Sex: Female) Other Clinician: Primary Care Physician: Glendale Chard Treating Eran [name] Referring Physician: Glendale Chard Physician/Extender: Weeks in Treatment: 9 Constitutional . Pulse regular. Respirations normal and unlabored. Afebrile. . Eyes Nonicteric. Reactive to light. Ears, Nose, Mouth, and Throat Lips, teeth,  and gums WNL.[name] Moist mucosa without lesions. Neck supple and nontender. No palpable supraclavicular or cervical adenopathy. Normal sized without goiter. Respiratory WNL. No retractions.. Cardiovascular Pedal Pulses WNL. No clubbing, cyanosis or edema. Lymphatic No adneopathy. No adenopathy. No adenopathy. Musculoskeletal Adexa without tenderness or enlargement.. Digits and nails w/o clubbing, cyanosis, infection, petechiae, ischemia, or inflammatory conditions.. Integumentary (Hair, Skin) No suspicious lesions. No crepitus or fluctuance. No peri-wound warmth or erythema. No masses.[name] Psychiatric Judgement and insight Intact.. No evidence of depression, anxiety, or agitation.. Notes the patient has not had much change in her wound size and no sharp debridement was required today. Electronic Signature(s) Signed: 04/04/2016 2:19:39 PM By: Christin Fudge MD, FACS Entered By: Christin Fudge on 04/04/2016 14:19:38 [name] ([id number]) -------------------------------------------------------------------------------- Physician Orders Details Patient Name: [name], [name] 04/04/2016 1:30 Date of Service: PM Medical Record [id number] Number: Patient Account Number: [id number] Mar 24, 1941 (75 y.o. Treating RN: Montey Hora Date of Birth/Sex: Female) Other Clinician: Primary Care Physician: Glendale Chard Treating Dekker Verga Referring Physician: Glendale Chard Physician/Extender: Suella Grove in Treatment: 9 Verbal / Phone Orders: Yes Clinician: Montey Hora Read Back and Verified: Yes Diagnosis Coding Wound  Cleansing Wound #1 Left,Lateral Lower Leg o Clean wound with Normal Saline. o May Shower, gently pat wound dry prior to applying new dressing. Anesthetic Wound #1 Left,Lateral Lower Leg o Topical Lidocaine 4% cream applied to wound bed prior to debridement Skin Barriers/Peri-Wound Care Wound #1 Left,Lateral Lower Leg o Skin Prep Primary Wound Dressing Wound #1 Left,Lateral Lower Leg o Hydrafera Blue - after you run out of polymem with silver o Other: - polymem with silver - HHRN please try to get this product for patient to use Secondary Dressing Wound #1 Left,Lateral Lower Leg o Dry Gauze o Boardered Foam Dressing Dressing Change Frequency Wound #1 Left,Lateral Lower Leg o Change dressing every other day. Follow-up Appointments Wound #1 Left,Lateral Lower Leg o Return Appointment in 2 weeks. Edema Control Wound #1 Left,Lateral Lower Leg [name], [name] ([id number]) o Patient to wear own compression stockings Home Health Wound #1 Left,Lateral Lower Leg o [place] Visits - [place] Go [facility] to visit patient 2 times weekly, family to change dressing all other days - [facility] please try to get polymem with silver for patient to use o Home Health Nurse may visit PRN to address patientos wound care needs. o FACE TO FACE ENCOUNTER: MEDICARE and MEDICAID PATIENTS: I certify that this patient is under my care and that I had a face-to-face encounter that meets the physician face-to-face encounter requirements with this patient on this date. The encounter with the patient was in whole or in part for the following MEDICAL CONDITION: (primary reason for [place]) MEDICAL NECESSITY: I certify, that based on my findings, NURSING services are a medically necessary home health service. HOME BOUND STATUS: I certify that my clinical findings support that this patient is homebound (i.e., Due to illness or injury, pt requires aid of supportive devices  such as crutches, cane, wheelchairs, walkers, the use of special transportation or the assistance of another person to leave their place of residence. There is a normal inability to leave the home and doing so requires considerable and taxing effort. Other absences are for medical reasons / religious services and are infrequent or of short duration when for other reasons). o If current dressing causes regression in wound condition, may D/C ordered dressing product/s and apply Normal Saline Moist Dressing daily until next [place] / Other MD appointment. [facility]ify  [facility] of regression in wound condition at [phone]. o Please direct any NON-WOUND related issues/requests for orders to patient's Primary Care Physician Electronic Signature(s) Signed: 04/04/2016 4:17:50 PM By: Christin Fudge MD, FACS Signed: 04/04/2016 5:09:17 PM By: Montey Hora Entered By: Montey Hora on 04/04/2016 14:19:39 [name] ([id number]) -------------------------------------------------------------------------------- Problem List Details Patient Name: [name], [name] 04/04/2016 1:30 Date of Service: PM Medical Record [id number] Number: Patient Account Number: [id number] [birth year] (75 y.o. Treating RN: Montey Hora Date of Birth/Sex: Female) Other Clinician: Primary Care Physician: Glendale Chard Treating Christin Fudge Referring Physician: Glendale Chard Physician/Extender: Weeks in Treatment: 9 Active Problems ICD-10 Encounter Code Description Active Date Diagnosis E11.622 Type 2 diabetes mellitus with other skin ulcer 02/01/2016 Yes L97.222 Non-pressure chronic ulcer of left calf with fat layer 02/01/2016 Yes exposed I89.0 Lymphedema, not elsewhere classified 02/01/2016 Yes Inactive Problems Resolved Problems Electronic Signature(s) Signed: 04/04/2016 2:18:27 PM By: Christin Fudge MD, FACS Entered By: Christin Fudge on 04/04/2016 14:18:27 [name] ([id number]) -------------------------------------------------------------------------------- Progress Note Details Patient Name: [name]. 04/04/2016 1:30 Date of Service: PM Medical Record [id number] Number: Patient Account Number: [id number] Oct 21, 1940 (75 y.o. Treating RN: Montey Hora Date of Birth/Sex: Female) Other Clinician: Primary Care Physician: Glendale Chard Treating Shelby Peltz Referring Physician: Glendale Chard Physician/Extender: Suella Grove in Treatment: 9 Subjective Chief Complaint Information obtained from Patient Patients presents for treatment of an open diabetic ulcer the left lower extremity which she's had for about 4 weeks History of Present Illness (HPI) The following HPI elements were documented for the patient's wound: Location: left lower extremity lateral calf Quality: Patient reports experiencing a dull pain to affected area(s). Severity: Patient states wound are getting worse. Duration: Patient has had the wound for < 4 weeks prior to presenting for treatment Timing: Pain in wound is Intermittent (comes and goes Context: The wound occurred when the patient was injured with a sharp corner for car door Modifying Factors: Other treatment(s) tried include:local care and 3 courses of antibiotics including Keflex, Bactrim and doxycycline Associated Signs and Symptoms: Patient reports having increase swelling. 75 year old patient with a past medical history of diabetes mellitus, chronic kidney disease, COPD, hypertension, rheumatoid arthritis had a lacerated wound to her left calf when she injured it on the corner of a car door and it was a long 12 cm laceration. x-ray done at the time revealed that there was no fracture or radiopaque foreign body. the wound was sutured with 4-0 Prolene simple interrupted sutures and a total of 14 sutures were applied. on August 31 she was seen in the ER and it was noted that the wound had dehisced and she was  treated with Keflex. She has completed a course of doxycycline recently Medical history also significant for status post abdominal hysterectomy, brain surgery, cardiac catheterization, EGD and bronchoscopy. of late she was back in the ER and was seen last on August 31 and they tried Silvadene 02/22/16: pt reports discomfort and mild pain just below her wound. however, she admits on legs more than usual. no redness, foul odor or purulence. the wound is measuring slightly larger today, but likely secondary to poor edema control. nursing staff gave information to pt today regarding purchase of her compression stockings. she was encouraged to purchase soon. 02/29/16: returns today for f/u. she denies systemic s/s of infection. she brought her new compression stockings today. 03/21/2016 -- She has been put on an antibiotic and prednisone for a bronchitis by her pulmonologist. 04/04/2016 -- she continues to  be on prednisone. [name], [name] ([id number]) Objective Constitutional Pulse regular. Respirations normal and unlabored. Afebrile. Vitals Time Taken: 1:47 PM, Height: 64 in, Weight: 283 lbs, BMI: 48.6, Temperature: 98.3 F, Pulse: 93 bpm, Respiratory Rate: 20 breaths/min, Blood Pressure: 123/63 mmHg. Eyes Nonicteric. Reactive to light. Ears, Nose, Mouth, and Throat Lips, teeth, and gums WNL.[name] Moist mucosa without lesions. Neck supple and nontender. No palpable supraclavicular or cervical adenopathy. Normal sized without goiter. Respiratory WNL. No retractions.. Cardiovascular Pedal Pulses WNL. No clubbing, cyanosis or edema. Lymphatic No adneopathy. No adenopathy. No adenopathy. Musculoskeletal Adexa without tenderness or enlargement.. Digits and nails w/o clubbing, cyanosis, infection, petechiae, ischemia, or inflammatory conditions.[name] Psychiatric Judgement and insight Intact.. No evidence of depression, anxiety, or agitation.. General Notes: the patient has not had much change  in her wound size and no sharp debridement was required today. Integumentary (Hair, Skin) No suspicious lesions. No crepitus or fluctuance. No peri-wound warmth or erythema. No masses.. Wound #1 status is Open. Original cause of wound was Trauma. The wound is located on the Left,Lateral Lower Leg. The wound measures 3.2cm length x 2.6cm width x 0.4cm depth; 6.535cm^2 area and 2.614cm^3 volume. The wound is limited to skin breakdown. There is no tunneling or undermining noted. [name], [name] ([id number]) There is a medium amount of sanguinous drainage noted. The wound margin is distinct with the outline attached to the wound base. There is large (67-100%) red granulation within the wound bed. There is a small (1-33%) amount of necrotic tissue within the wound bed including Adherent Slough. The periwound skin appearance exhibited: Localized Edema, Scarring, Moist, Ecchymosis, Hemosiderin Staining. The periwound skin appearance did not exhibit: Callus, Crepitus, Excoriation, Fluctuance, Friable, Induration, Rash, Dry/Scaly, Maceration, Atrophie Blanche, Cyanosis, Mottled, Pallor, Rubor, Erythema. Periwound temperature was noted as No Abnormality. The periwound has tenderness on palpation. Assessment Active Problems ICD-10 E11.622 - Type 2 diabetes mellitus with other skin ulcer L97.222 - Non-pressure chronic ulcer of left calf with fat layer exposed I89.0 - Lymphedema, not elsewhere classified Plan Wound Cleansing: Wound #1 Left,Lateral Lower Leg: Clean wound with Normal Saline. May Shower, gently pat wound dry prior to applying new dressing. Anesthetic: Wound #1 Left,Lateral Lower Leg: Topical Lidocaine 4% cream applied to wound bed prior to debridement Skin Barriers/Peri-Wound Care: Wound #1 Left,Lateral Lower Leg: Skin Prep Primary Wound Dressing: Wound #1 Left,Lateral Lower Leg: Hydrafera Blue - after you run out of polymem with silver Other: - polymem with silver - HHRN  please try to get this product for patient to use Secondary Dressing: Wound #1 Left,Lateral Lower Leg: Dry Gauze Boardered Foam Dressing Dressing Change Frequency: Wound #1 Left,Lateral Lower Leg: Change dressing every other day. Follow-up Appointments: Wound #1 Left,Lateral Lower Leg: [name], [name] ([id number]) Return Appointment in 2 weeks. Edema Control: Wound #1 Left,Lateral Lower Leg: Patient to wear own compression stockings Home Health: Wound #1 Left,Lateral Lower Leg: Continue Home Health Visits - [place] Go [facility] to visit patient 2 times weekly, family to change dressing all other days - [facility] please try to get polymem with silver for patient to use Home Health Nurse may visit PRN to address patient s wound care needs. FACE TO FACE ENCOUNTER: MEDICARE and MEDICAID PATIENTS: I certify that this patient is under my care and that I had a face-to-face encounter that meets the physician face-to-face encounter requirements with this patient on this date. The encounter with the patient was in whole or in part for the following MEDICAL CONDITION: (primary reason for [place])  MEDICAL NECESSITY: I certify, that based on my findings, NURSING services are a medically necessary home health service. HOME BOUND STATUS: I certify that my clinical findings support that this patient is homebound (i.e., Due to illness or injury, pt requires aid of supportive devices such as crutches, cane, wheelchairs, walkers, the use of special transportation or the assistance of another person to leave their place of residence. There is a normal inability to leave the home and doing so requires considerable and taxing effort. Other absences are for medical reasons / religious services and are infrequent or of short duration when for other reasons). If current dressing causes regression in wound condition, may D/C ordered dressing product/s and apply Normal Saline Moist Dressing daily until next  [place] / Other MD appointment. [place] of regression in wound condition at [phone]. Please direct any NON-WOUND related issues/requests for orders to patient's Primary Care Physician I have recommended we use PolyMem Silver for about a week and then continue with [facility] and we should continue her local compression. I have told her to aggressively elevate and exercise to reduce her lymphedema. Because of a holiday we'll see her back in 2 weeks Electronic Signature(s) Signed: 04/04/2016 4:22:54 PM By: Christin Fudge MD, FACS Previous Signature: 04/04/2016 2:20:28 PM Version By: Christin Fudge MD, FACS Entered By: Christin Fudge on 04/04/2016 16:22:54 [name] ([id number]) -------------------------------------------------------------------------------- SuperBill Details Patient Name: [name]. Date of Service: 04/04/2016 Medical Record Number: [id number] Patient Account Number: [id number] Date of Birth/Sex: [birth year] (75 y.o. Female) Treating RN: Montey Hora Primary Care Physician: Glendale Chard Other Clinician: Referring Physician: Glendale Chard Treating Physician/Extender: Frann Rider in Treatment: 9 Diagnosis Coding ICD-10 Codes Code Description E11.622 Type 2 diabetes mellitus with other skin ulcer L97.222 Non-pressure chronic ulcer of left calf with fat layer exposed I89.0 Lymphedema, not elsewhere classified Facility Procedures CPT4 Code: 56701410 Description: 99213 - WOUND CARE VISIT-LEV 3 EST PT Modifier: Quantity: 1 Physician Procedures CPT4 Code: 3013143 Description: 88875 - WC PHYS LEVEL 3 - EST PT ICD-10 Description Diagnosis E11.622 Type 2 diabetes mellitus with other skin ulcer L97.222 Non-pressure chronic ulcer of left calf with fat I89.0 Lymphedema, not elsewhere classified Modifier: layer exposed Quantity: 1 Electronic Signature(s) Signed: 04/04/2016 4:30:42 PM By: Montey Hora Previous Signature: 04/04/2016 2:20:40 PM Version By: Christin Fudge MD, FACS Entered By: Montey Hora on 04/04/2016 16:30:41

## 2016-04-06 NOTE — Progress Notes (Signed)
[name], [name] ([id number]) Visit Report for 04/04/2016 Arrival Information Details Patient Name: [name], [name]. Date of Service: 04/04/2016 1:30 PM Medical Record Number: [id number] Patient Account Number: [id number] Date of Birth/Sex: 10-07-1940 (75 y.o. Female) Treating RN: Montey Hora Primary Care Physician: Glendale Chard Other Clinician: Referring Physician: Glendale Chard Treating Physician/Extender: Frann Rider in Treatment: 9 Visit Information History Since Last Visit Added or deleted any medications: No Patient Arrived: Walker Any new allergies or adverse reactions: No Arrival Time: 13:45 Had a fall or experienced change in No Accompanied By: dtr activities of daily living that may affect Transfer Assistance: None risk of falls: Patient Identification Verified: Yes Signs or symptoms of abuse/neglect since last No Secondary Verification Process Completed: Yes visito Patient Has Alerts: Yes Hospitalized since last visit: No Patient Alerts: DM II Pain Present Now: No Notes patient arrived at 1338 Electronic Signature(s) Signed: 04/04/2016 5:09:17 PM By: Montey Hora Entered By: Montey Hora on 04/04/2016 13:47:12 [name] ([id number]) -------------------------------------------------------------------------------- Clinic Level of Care Assessment Details Patient Name: [name] Date of Service: 04/04/2016 1:30 PM Medical Record Number: [id number] Patient Account Number: [id number] Date of Birth/Sex: 02-Jul-1940 (75 y.o. Female) Treating RN: Montey Hora Primary Care Physician: Glendale Chard Other Clinician: Referring Physician: Glendale Chard Treating Physician/Extender: Frann Rider in Treatment: 9 Clinic Level of Care Assessment Items TOOL 4 Quantity Score _0  - Use when only an EandM is performed on FOLLOW-UP visit 0 ASSESSMENTS - Nursing Assessment / Reassessment X - Reassessment of Co-morbidities (includes  updates in patient status) 1 10 X - Reassessment of Adherence to Treatment Plan 1 5 ASSESSMENTS - Wound and Skin Assessment / Reassessment X - Simple Wound Assessment / Reassessment - one wound 1 5 _1  - Complex Wound Assessment / Reassessment - multiple wounds 0 _2  - Dermatologic / Skin Assessment (not related to wound area) 0 ASSESSMENTS - Focused Assessment _3  - Circumferential Edema Measurements - multi extremities 0 _4  - Nutritional Assessment / Counseling / Intervention 0 X - Lower Extremity Assessment (monofilament, tuning fork, pulses) 1 5 _5  - Peripheral Arterial Disease Assessment (using hand held doppler) 0 ASSESSMENTS - Ostomy and/or Continence Assessment and Care _6  - Incontinence Assessment and Management 0 _7  - Ostomy Care Assessment and Management (repouching, etc.) 0 PROCESS - Coordination of Care X - Simple Patient / Family Education for ongoing care 1 15 _8  - Complex (extensive) Patient / Family Education for ongoing care 0 _9  - Staff obtains Programmer, systems, Records, Test Results / Process Orders 0 _10  - Staff telephones HHA, Nursing Homes / Clarify orders / etc 0 _11  - Routine Transfer to another Facility (non-emergent condition) 0 [name], [name] ([id number]) _12  - Routine Hospital Admission (non-emergent condition) 0 _13  - New Admissions / Biomedical engineer / Ordering NPWT, Apligraf, etc. 0 _14  - Emergency Hospital Admission (emergent condition) 0 X - Simple Discharge Coordination 1 10 _15  - Complex (extensive) Discharge Coordination 0 PROCESS - Special Needs _16  - Pediatric / Minor Patient Management 0 _17  - Isolation Patient Management 0 _18  - Hearing / Language / Visual special needs 0 _19  - Assessment of Community assistance (transportation, D/C planning, etc.) 0 _20  - Additional assistance / Altered mentation 0 _21  - Support Surface(s) Assessment (bed, cushion, seat, etc.) 0 INTERVENTIONS - Wound Cleansing / Measurement X - Simple Wound Cleansing - one wound 1  5 _22  - Complex Wound Cleansing - multiple wounds 0 X - Wound Imaging (photographs - any number of wounds) 1 5 _23  - Wound Tracing (instead  of photographs) 0 X - Simple Wound Measurement - one wound 1 5 _0  - Complex Wound Measurement - multiple wounds 0 INTERVENTIONS - Wound Dressings X - Small Wound Dressing one or multiple wounds 1 10 _1  - Medium Wound Dressing one or multiple wounds 0 _2  - Large Wound Dressing one or multiple wounds 0 <TKPTWSFKCLEXNTZG>_0<\/FVCBSWHQPRFFMBWG>_6  - Application of Medications - topical 0 <KZLDJTTSVXBLTJQZ>_0<\/SPQZRAQTMAUQJFHL>_4  - Application of Medications - injection 0 INTERVENTIONS - Miscellaneous _5  - External ear exam 0 [name], [name] ([id number]) _6  - Specimen Collection (cultures, biopsies, blood, body fluids, etc.) 0 _7  - Specimen(s) / Culture(s) sent or taken to Lab for analysis 0 _8  - Patient Transfer (multiple staff / Harrel Lemon Lift / Similar devices) 0 _9  - Simple Staple / Suture removal (25 or less) 0 _10  - Complex Staple / Suture removal (26 or more) 0 _11  - Hypo / Hyperglycemic Management (close monitor of Blood Glucose) 0 _12  - Ankle / Brachial Index (ABI) - do not check if billed separately 0 X - Vital Signs 1 5 Has the patient been seen at the hospital within the last three years: Yes Total Score: 80 Level Of Care: New/Established - Level 3 Electronic Signature(s) Signed: 04/04/2016 5:09:17 PM By: Montey Hora Entered By: Montey Hora on 04/04/2016 16:30:33 [name] ([id number]) -------------------------------------------------------------------------------- Encounter Discharge Information Details Patient Name: [name]. Date of Service: 04/04/2016 1:30 PM Medical Record Number: [id number] Patient Account Number: [id number] Date of Birth/Sex: 1941-05-04 (75 y.o. Female) Treating RN: Montey Hora Primary Care Physician: Glendale Chard Other Clinician: Referring Physician: Glendale Chard Treating Physician/Extender: Frann Rider in Treatment: 9 Encounter Discharge Information  Items Discharge Pain Level: 0 Discharge Condition: Stable Ambulatory Status: Walker Discharge Destination: Home Transportation: Private Auto Accompanied By: dtr Schedule Follow-up Appointment: Yes Medication Reconciliation completed and provided to Patient/Care No [name]: Provided on Clinical Summary of Care: 04/04/2016 Form Type Recipient Paper Patient DG Electronic Signature(s) Signed: 04/04/2016 4:31:45 PM By: Montey Hora Previous Signature: 04/04/2016 2:30:21 PM Version By: Ruthine Dose Entered By: Montey Hora on 04/04/2016 16:31:44 [name], Jozette E. ([id number]) -------------------------------------------------------------------------------- Lower Extremity Assessment Details Patient Name: [name]. Date of Service: 04/04/2016 1:30 PM Medical Record Number: [id number] Patient Account Number: [id number] Date of Birth/Sex: [birth year] (75 y.o. Female) Treating RN: Montey Hora Primary Care Physician: Glendale Chard Other Clinician: Referring Physician: Glendale Chard Treating Physician/Extender: Frann Rider in Treatment: 9 Edema Assessment Assessed: [Left: No] [Right: No] E[Left: dema] [Right: :] Calf Left: Right: Point of Measurement: 34 cm From Medial Instep 48.3 cm cm Ankle Left: Right: Point of Measurement: 11 cm From Medial Instep 32 cm cm Vascular Assessment Pulses: Posterior Tibial Dorsalis Pedis Palpable: [Left:Yes] Extremity colors, hair growth, and conditions: Extremity Color: [Left:Hyperpigmented] Hair Growth on Extremity: [Left:No] Temperature of Extremity: [Left:Warm] Capillary Refill: [Left:< 3 seconds] Electronic Signature(s) Signed: 04/04/2016 5:09:17 PM By: Montey Hora Entered By: Montey Hora on 04/04/2016 13:54:56 Aikman, [name] ([id number]) -------------------------------------------------------------------------------- Multi Wound Chart Details Patient Name: [name]. Date of Service:  04/04/2016 1:30 PM Medical Record Number: [id number] Patient Account Number: [id number] Date of Birth/Sex: 1941/01/06 (75 y.o. 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] [N/A:N/A] Comorbid History: [1:Cataracts, Lymphedema, N/A Hypertension, Type II Diabetes, Gout, Rheumatoid Arthritis, Osteoarthritis, Neuropathy] Date Acquired: [1:12/27/2015] [N/A:N/A] Weeks of Treatment: [1:9] [N/A:N/A] Wound Status: [1:Open] [N/A:N/A] Measurements L x W x D 3.2x2.6x0.4 [N/A:N/A] (cm) Area (cm) : [1:6.535] [N/A:N/A] Volume (cm) : [1:2.614] [N/A:N/A] % Reduction in Area: [1:67.00%] [N/A:N/A] % Reduction in Volume: 34.00% [N/A:N/A] Classification: [1:Grade 2] [N/A:N/A] Exudate Amount: [1:Medium] [N/A:N/A] Exudate Type: [1:Sanguinous] [N/A:N/A] Exudate Color: [1:red] [N/A:N/A] Wound Margin: [1:Distinct, outline attached N/A] Granulation Amount: [1:Large (67-100%)] [N/A:N/A] Granulation Quality: [1:Red] [N/A:N/A] Necrotic Amount: [1:Small (1-33%)] [N/A:N/A] Exposed Structures: [1:Fascia: No Fat: No] [N/A:N/A] Tendon: No Muscle: No Joint: No Bone: No Limited to Skin Breakdown Epithelialization: Medium (34-66%) N/A N/A Periwound Skin Texture: Edema: Yes N/A N/A Scarring: Yes Excoriation: No Induration: No Callus: No Crepitus: No Fluctuance: No Friable: No Rash: No Periwound Skin Moist: Yes N/A N/A Moisture: Maceration: No Dry/Scaly:  No Periwound Skin Color: Ecchymosis: Yes N/A N/A Hemosiderin Staining: Yes Atrophie Blanche: No Cyanosis: No Erythema: No Mottled: No Pallor: No Rubor: No Temperature: No Abnormality N/A N/A Tenderness on Yes N/A N/A Palpation: Wound Preparation: Ulcer Cleansing: N/A N/A Rinsed/Irrigated with Saline Topical Anesthetic Applied: Other: lidocaine 4% Treatment Notes Electronic Signature(s) Signed: 04/04/2016 5:09:17 PM By: Montey Hora Entered By: Montey Hora on 04/04/2016 13:55:08 [name] ([id number]) -------------------------------------------------------------------------------- [place] Details Patient Name: [name] Date of Service: 04/04/2016 1:30 PM Medical Record Number: [id number] Patient Account Number: [id number] Date of Birth/Sex: September 04, 1940 (75 y.o. Female) Treating RN: Montey Hora Primary Care Physician: Glendale Chard Other Clinician: Referring Physician: Glendale Chard Treating Physician/Extender: Frann Rider in Treatment: 9 Active Inactive Abuse / Safety / Falls / Self Care Management Nursing Diagnoses: Impaired physical mobility Potential for falls Goals: Patient will remain injury free Date Initiated: 02/01/2016 Goal Status: Active Interventions: Assess fall risk on admission and as needed Notes: Orientation to the Wound Care Program Nursing Diagnoses: Knowledge deficit related to the wound healing center program Goals: Patient/caregiver will verbalize understanding of the [place] [facility] Date Initiated: 02/01/2016 Goal Status: Active Interventions: Provide education on orientation to the wound center Notes: Wound/Skin Impairment Nursing Diagnoses: Impaired tissue integrity Goals: Patient/caregiver will verbalize understanding of skin care regimen [name], [name] ([id number]) Date Initiated: 02/01/2016 Goal Status: Active Ulcer/skin breakdown will have a volume  reduction of 30% by week 4 Date Initiated: 02/01/2016 Goal Status: Active Ulcer/skin breakdown will have a volume reduction of 50% by week 8 Date Initiated: 02/01/2016 Goal Status: Active Ulcer/skin breakdown will have a volume reduction of 80% by week 12 Date Initiated: 02/01/2016 Goal Status: Active Ulcer/skin breakdown will heal within 14 weeks Date Initiated: 02/01/2016 Goal Status: Active Interventions: Assess patient/caregiver ability to obtain necessary supplies Assess patient/caregiver ability to perform ulcer/skin care regimen upon admission and as needed Assess ulceration(s) every visit Notes: Electronic Signature(s) Signed: 04/04/2016 5:09:17 PM By: Montey Hora Entered By: Montey Hora on 04/04/2016 13:55:01 [name], [name] ([id number]) -------------------------------------------------------------------------------- Pain Assessment Details Patient Name: [name]. Date of Service: 04/04/2016 1:30 PM Medical Record Number: [id number] Patient Account Number: [id number] Date of Birth/Sex: [birth year] (75 y.o. Female) Treating RN: Montey Hora Primary Care Physician: Glendale Chard Other Clinician: Referring Physician: Glendale Chard Treating Physician/Extender: Frann Rider in Treatment: 9 Active Problems Location of Pain Severity and Description of Pain Patient Has Paino No Site Locations Pain Management and Medication Current Pain Management: Notes Topical or injectable lidocaine is offered to patient for acute pain when surgical debridement  is performed. If needed, Patient is instructed to use over the counter pain medication for the following 24-48 hours after debridement. Wound care MDs do not prescribed pain medications. Patient has chronic pain or uncontrolled pain. Patient has been instructed to make an appointment with their Primary Care Physician for pain management. Electronic Signature(s) Signed: 04/04/2016 5:09:17 PM By: Montey Hora Entered By: Montey Hora on 04/04/2016 13:47:29 [name] ([id number]) -------------------------------------------------------------------------------- Patient/Caregiver Education Details Patient Name: [name] Date of Service: 04/04/2016 1:30 PM Medical Record Number: [id number] Patient Account Number: [id number] Date of Birth/Gender: 1940-07-10 (75 y.o. Female) Treating RN: Montey Hora Primary Care Physician: Glendale Chard Other Clinician: Referring Physician: Glendale Chard Treating Physician/Extender: Frann Rider in Treatment: 9 Education Assessment Education Provided To: Patient and Caregiver Education Topics Provided Venous: Handouts: Controlling Swelling with Compression Stockings Methods: Explain/Verbal Responses: State content correctly Electronic Signature(s) Signed: 04/04/2016 5:09:17 PM By: Montey Hora Entered By: Montey Hora on 04/04/2016 16:32:02 [name] ([id number]) -------------------------------------------------------------------------------- Wound Assessment Details Patient Name: [name]. Date of Service: 04/04/2016 1:30 PM Medical Record Number: [id number] Patient Account Number: [id number] Date of Birth/Sex: 1940-11-24 (75 y.o. Female) Treating RN: Montey Hora Primary Care Physician: Glendale Chard Other Clinician: Referring Physician: Glendale Chard Treating Physician/Extender: Frann Rider in Treatment: 9 Wound Status Wound Number: 1 Primary Diabetic Wound/Ulcer of the Lower Etiology: Extremity Wound Location: Left Lower Leg - Lateral Secondary Auto-immune Wounding Event: Trauma Etiology: Date Acquired: 12/27/2015 Wound Open Weeks Of Treatment: 9 Status: Clustered Wound: No Comorbid Cataracts, Lymphedema, History: Hypertension, Type II Diabetes, Gout, Rheumatoid Arthritis, Osteoarthritis, Neuropathy Photos Wound Measurements Length: (cm) 3.2 Width: (cm)  2.6 Depth: (cm) 0.4 Area: (cm) 6.535 Volume: (cm) 2.614 % Reduction in Area: 67% % Reduction in Volume: 34% Epithelialization: Medium (34-66%) Tunneling: No Undermining: No Wound Description Classification: Grade 2 Wound Margin: Distinct, outline attached Exudate Amount: Medium Exudate Type: Sanguinous Exudate Color: red Wound Bed Granulation Amount: Large (67-100%) Exposed Structure Granulation Quality: Red Fascia Exposed: No Gatton, [name] ([id number]) Necrotic Amount: Small (1-33%) Fat Layer Exposed: No Necrotic Quality: Adherent Slough Tendon Exposed: No Muscle Exposed: No Joint Exposed: No Bone Exposed: No Limited to Skin Breakdown Periwound Skin Texture Texture Color No Abnormalities Noted: No No Abnormalities Noted: No Callus: No Atrophie Blanche: No Crepitus: No Cyanosis: No Excoriation: No Ecchymosis: Yes Fluctuance: No Erythema: No Friable: No Hemosiderin Staining: Yes Induration: No Mottled: No Localized Edema: Yes Pallor: No Rash: No Rubor: No Scarring: Yes Temperature / Pain Moisture Temperature: No Abnormality No Abnormalities Noted: No Tenderness on Palpation: Yes Dry / Scaly: No Maceration: No Moist: Yes Wound Preparation Ulcer Cleansing: Rinsed/Irrigated with Saline Topical Anesthetic Applied: Other: lidocaine 4%, Treatment Notes Wound #1 (Left, Lateral Lower Leg) 1. Cleansed with: Clean wound with Normal Saline 2. Anesthetic Topical Lidocaine 4% cream to wound bed prior to debridement 3. Peri-wound Care: Skin Prep 4. Dressing Applied: Other dressing (specify in notes) 5. Secondary Dressing Applied Bordered Foam Dressing Notes polymem with silver Electronic Signature(s) Signed: 04/04/2016 5:09:17 PM By: Melody Haver, [name] ([id number]) Entered By: Montey Hora on 04/04/2016 14:35:37 [name]  ([id number]) -------------------------------------------------------------------------------- Vitals Details Patient Name: [name]. Date of Service: 04/04/2016 1:30 PM Medical Record Number: [id number] Patient Account Number: [id number] Date of Birth/Sex: 12/09/1940 (75 y.o. Female) Treating RN: Montey Hora Primary Care Physician: Glendale Chard Other Clinician: Referring Physician: Glendale Chard Treating Physician/Extender: Frann Rider in Treatment: 9 Vital Signs Time Taken: 13:47 Temperature (F): 98.3 Height (  in): 64 Pulse (bpm): 93 Weight (lbs): 283 Respiratory Rate (breaths/min): 20 Body Mass Index (BMI): 48.6 Blood Pressure (mmHg): 123/63 Reference Range: 80 - 120 mg / dl Electronic Signature(s) Signed: 04/04/2016 5:09:17 PM By: Montey Hora Entered By: Montey Hora on 04/04/2016 13:48:23

## 2016-04-07 ENCOUNTER — Telehealth: Payer: Self-pay | Admitting: Internal Medicine

## 2016-04-07 DIAGNOSIS — I5032 Chronic diastolic (congestive) heart failure: Secondary | ICD-10-CM | POA: Diagnosis not present

## 2016-04-07 DIAGNOSIS — I130 Hypertensive heart and chronic kidney disease with heart failure and stage 1 through stage 4 chronic kidney disease, or unspecified chronic kidney disease: Secondary | ICD-10-CM | POA: Diagnosis not present

## 2016-04-07 DIAGNOSIS — N182 Chronic kidney disease, stage 2 (mild): Secondary | ICD-10-CM | POA: Diagnosis not present

## 2016-04-07 DIAGNOSIS — T798XXD Other early complications of trauma, subsequent encounter: Secondary | ICD-10-CM | POA: Diagnosis not present

## 2016-04-07 DIAGNOSIS — E1122 Type 2 diabetes mellitus with diabetic chronic kidney disease: Secondary | ICD-10-CM | POA: Diagnosis not present

## 2016-04-07 DIAGNOSIS — S81812D Laceration without foreign body, left lower leg, subsequent encounter: Secondary | ICD-10-CM | POA: Diagnosis not present

## 2016-04-07 NOTE — Telephone Encounter (Signed)
Called and spoke to pt. Pt states she is not back to baseline after getting the abx and pred on 10.31.17. Pt states she has been improving since completing the abx and pred and is feeling much better. I advised pt to continue to allow time for her to get back to baseline and to call back if she were to start feeling worse. Pt verbalized understanding and denied any further questions or concerns at this time.   Will send to MR as FYI.

## 2016-04-08 LAB — 6-ACETYLMORPHINE,TOXASSURE ADD
6-ACETYLMORPHINE: NEGATIVE
6-ACETYLMORPHINE: NOT DETECTED ng/mg{creat}

## 2016-04-08 LAB — TOXASSURE SELECT,+ANTIDEPR,UR

## 2016-04-09 DIAGNOSIS — N182 Chronic kidney disease, stage 2 (mild): Secondary | ICD-10-CM | POA: Diagnosis not present

## 2016-04-09 DIAGNOSIS — T798XXD Other early complications of trauma, subsequent encounter: Secondary | ICD-10-CM | POA: Diagnosis not present

## 2016-04-09 DIAGNOSIS — E1122 Type 2 diabetes mellitus with diabetic chronic kidney disease: Secondary | ICD-10-CM | POA: Diagnosis not present

## 2016-04-09 DIAGNOSIS — I130 Hypertensive heart and chronic kidney disease with heart failure and stage 1 through stage 4 chronic kidney disease, or unspecified chronic kidney disease: Secondary | ICD-10-CM | POA: Diagnosis not present

## 2016-04-09 DIAGNOSIS — I5032 Chronic diastolic (congestive) heart failure: Secondary | ICD-10-CM | POA: Diagnosis not present

## 2016-04-09 DIAGNOSIS — S81812D Laceration without foreign body, left lower leg, subsequent encounter: Secondary | ICD-10-CM | POA: Diagnosis not present

## 2016-04-14 ENCOUNTER — Encounter: Payer: Self-pay | Admitting: Rheumatology

## 2016-04-14 ENCOUNTER — Ambulatory Visit (INDEPENDENT_AMBULATORY_CARE_PROVIDER_SITE_OTHER): Payer: Medicare Other | Admitting: Rheumatology

## 2016-04-14 VITALS — BP 107/65 | HR 89 | Resp 14 | Ht 64.0 in | Wt 292.0 lb

## 2016-04-14 DIAGNOSIS — M05741 Rheumatoid arthritis with rheumatoid factor of right hand without organ or systems involvement: Secondary | ICD-10-CM | POA: Diagnosis not present

## 2016-04-14 DIAGNOSIS — M069 Rheumatoid arthritis, unspecified: Secondary | ICD-10-CM

## 2016-04-14 DIAGNOSIS — M25562 Pain in left knee: Secondary | ICD-10-CM

## 2016-04-14 DIAGNOSIS — M1288 Other specific arthropathies, not elsewhere classified, other specified site: Secondary | ICD-10-CM | POA: Diagnosis not present

## 2016-04-14 DIAGNOSIS — G8929 Other chronic pain: Secondary | ICD-10-CM | POA: Diagnosis not present

## 2016-04-14 DIAGNOSIS — M05742 Rheumatoid arthritis with rheumatoid factor of left hand without organ or systems involvement: Secondary | ICD-10-CM

## 2016-04-14 DIAGNOSIS — Z79899 Other long term (current) drug therapy: Secondary | ICD-10-CM | POA: Diagnosis not present

## 2016-04-14 DIAGNOSIS — R5381 Other malaise: Secondary | ICD-10-CM | POA: Diagnosis not present

## 2016-04-14 DIAGNOSIS — E1140 Type 2 diabetes mellitus with diabetic neuropathy, unspecified: Secondary | ICD-10-CM

## 2016-04-14 DIAGNOSIS — E559 Vitamin D deficiency, unspecified: Secondary | ICD-10-CM | POA: Diagnosis not present

## 2016-04-14 DIAGNOSIS — M1711 Unilateral primary osteoarthritis, right knee: Secondary | ICD-10-CM

## 2016-04-14 DIAGNOSIS — J849 Interstitial pulmonary disease, unspecified: Secondary | ICD-10-CM

## 2016-04-14 DIAGNOSIS — S81812D Laceration without foreign body, left lower leg, subsequent encounter: Secondary | ICD-10-CM | POA: Diagnosis not present

## 2016-04-14 DIAGNOSIS — I130 Hypertensive heart and chronic kidney disease with heart failure and stage 1 through stage 4 chronic kidney disease, or unspecified chronic kidney disease: Secondary | ICD-10-CM | POA: Diagnosis not present

## 2016-04-14 DIAGNOSIS — M47816 Spondylosis without myelopathy or radiculopathy, lumbar region: Secondary | ICD-10-CM

## 2016-04-14 DIAGNOSIS — E1122 Type 2 diabetes mellitus with diabetic chronic kidney disease: Secondary | ICD-10-CM | POA: Diagnosis not present

## 2016-04-14 DIAGNOSIS — I5032 Chronic diastolic (congestive) heart failure: Secondary | ICD-10-CM | POA: Diagnosis not present

## 2016-04-14 DIAGNOSIS — T798XXD Other early complications of trauma, subsequent encounter: Secondary | ICD-10-CM | POA: Diagnosis not present

## 2016-04-14 DIAGNOSIS — N182 Chronic kidney disease, stage 2 (mild): Secondary | ICD-10-CM | POA: Diagnosis not present

## 2016-04-14 LAB — CBC WITH DIFFERENTIAL/PLATELET
Basophils Absolute: 0 cells/uL (ref 0–200)
Basophils Relative: 0 %
Eosinophils Absolute: 228 cells/uL (ref 15–500)
Eosinophils Relative: 3 %
HEMATOCRIT: 34.6 % — AB (ref 35.0–45.0)
Hemoglobin: 10.8 g/dL — ABNORMAL LOW (ref 11.7–15.5)
LYMPHS PCT: 18 %
Lymphs Abs: 1368 cells/uL (ref 850–3900)
MCH: 27.9 pg (ref 27.0–33.0)
MCHC: 31.2 g/dL — AB (ref 32.0–36.0)
MCV: 89.4 fL (ref 80.0–100.0)
MONO ABS: 532 {cells}/uL (ref 200–950)
MONOS PCT: 7 %
MPV: 10.1 fL (ref 7.5–12.5)
NEUTROS PCT: 72 %
Neutro Abs: 5472 cells/uL (ref 1500–7800)
PLATELETS: 224 10*3/uL (ref 140–400)
RBC: 3.87 MIL/uL (ref 3.80–5.10)
RDW: 16.3 % — AB (ref 11.0–15.0)
WBC: 7.6 10*3/uL (ref 3.8–10.8)

## 2016-04-14 MED ORDER — TRIAMCINOLONE ACETONIDE 40 MG/ML IJ SUSP
40.0000 mg | INTRAMUSCULAR | Status: AC | PRN
  Administered 2016-04-14: 40 mg via INTRA_ARTICULAR

## 2016-04-14 MED ORDER — DICLOFENAC SODIUM 1 % TD GEL: 4.0000 g | Freq: Four times a day (QID) | TRANSDERMAL | 3 refills | Status: AC

## 2016-04-14 MED ORDER — LIDOCAINE HCL 1 % IJ SOLN
1.5000 mL | INTRAMUSCULAR | Status: AC | PRN
  Administered 2016-04-14: 1.5 mL

## 2016-04-14 NOTE — Progress Notes (Signed)
Office Visit Note  Patient: [name]             Date of Birth: December 25, 1940           MRN: [id number]             PCP: Maximino Greenland, MD Referring: Glendale Chard, MD Visit Date: 04/14/2016 Occupation: _0 @    Subjective:  Follow-up Follow-up on RA and gout and OA of the knee joint.  History of Present Illness: [name] is a 75 y.o. female   Last seen in our office on 11/09/2015. At that visit patient was doing well with her RA. She states that she had no joint pain swelling or stiffness. At that visit she was also doing well with her gout. She is also seeing, Criscione-Schreiber, Lattie Haw, MD at [facility] rheumatology Department Patient was started on Plaquenil by their office. According to the notes from her last visit at [facility], patient has shown improvement in her rheumatoid arthritis based on 20 mg twice a day.  They've also increased her allopurinol to 300 mg daily. However at the last visit to [facility] she was only on 20 mg and was still doing well with her gout at that point.   Today ===> Patient is having swelling in her knees and having a lot of pain in her knees especially the left one  Otherwise her RA is doing well today as well as her gout.  She is on Plaquenil 20 mg twice a day  She is on allopurinol 3 mg every day  She is on prednisone 5 mg one day and then 7.5 mg the next day. She alternates between the 2. The goal is for her to decrease her prednisone gradually down to 5 mg when she can tolerate it.  Patient is currently seeing wound care specialist because she struck the corner of the car door on her left lateral lower leg in August 2017. Since that point she has been getting care from wound care center.  She is a known diabetic. Patient did have an infection in that wound initially and was placed on antibiotics for treatment of that. Currently she is having it well bandage and seeing the wound care specialist on a regular  basis until it's all fully healed.  Both of her lower legs or having a fair amount of swelling according to the patient that is preventing her from really worrying her compression stockings.  Activities of Daily Living:  Patient reports morning stiffness for 15 minutes.   Patient Denies nocturnal pain.  Difficulty dressing/grooming: Denies Difficulty climbing stairs: Denies Difficulty getting out of chair: Denies Difficulty using hands for taps, buttons, cutlery, and/or writing: Denies   Review of Systems  Constitutional: Negative for fatigue.  HENT: Negative for mouth sores and mouth dryness.   Eyes: Negative for dryness.  Respiratory: Negative for shortness of breath.   Gastrointestinal: Negative for constipation and diarrhea.  Musculoskeletal: Negative for myalgias and myalgias.  Skin: Negative for sensitivity to sunlight.  Psychiatric/Behavioral: Negative for decreased concentration and sleep disturbance.    PMFS History:  Patient Active Problem List   Diagnosis Date Noted  . Tendinitis of left rotator cuff 06/13/2015  . Acute on chronic diastolic heart failure ([place]) 04/21/2015  . Acute pulmonary edema (HCC)   . AKI (acute kidney injury) ([place])   . Peripheral edema   . Acute on chronic respiratory failure with hypoxia ([place]) 04/17/2015  . CAP (community acquired pneumonia) 04/17/2015  .  Acute kidney injury superimposed on chronic kidney disease ([place]) 04/17/2015  . Hypotension 04/17/2015  . Chronic respiratory failure with hypoxia ([place]) 02/09/2015  . Dyspnea and respiratory abnormality 01/03/2015  . Normal coronary arteries 11/02/2014  . Morbid obesity ([place]) 10/30/2014  . Dysphagia 08/21/2014  . Sore throat 07/18/2014  . Chronic fatigue 06/21/2014  . DOE (dyspnea on exertion) 05/24/2014  . Primary osteoarthritis of left knee 04/26/2014  . High risk medication use 04/17/2014  . Aphasia 02/12/2014  . 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  Lumbar facet arthropathy 01/18/2014  . Acute bronchitis 07/24/2013  . Bilateral edema of lower extremity 05/30/2013  . Chronic cough 05/24/2013  . ILD (interstitial lung disease) ([place]) 04/10/2013  . Greater trochanteric bursitis of left hip 08/13/2012  . Spinal stenosis of lumbar region with radiculopathy 07/12/2012  . Inability to walk 07/12/2012  . Acute lumbar back pain 07/12/2012  . Asymptomatic bacteriuria 07/12/2012  . 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 Benign neoplasm of meninges ([place]) 02/12/2012  . Brain mass 01/28/2012  . Chest pain with moderate risk of acute coronary syndrome 01/28/2012  . Rheumatoid arthritis ([place]) 01/28/2012  . Primary osteoarthritis of right knee 01/28/2012  . Dyslipidemia   . Thyroid disease   . Essential hypertension     Past Medical History:  Diagnosis Date  . Arthritis    endstage changes bilateral knees/bilateral ankles.   . Asthma   . Carotid artery occlusion   . Chronic kidney disease   . Clotting disorder ([place])    pt denies this  . Contusion of left knee    due to fall 1/14.  [name] COPD (chronic obstructive pulmonary disease) (HCC)    pulmonary fibrosis  . Depression, reactive   . Diabetes mellitus   . Diastolic dysfunction   . Family history of heart disease   . High cholesterol   . Hypertension   . Interstitial lung disease ([place])   . 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    . Morbid obesity ([place])   . Normal coronary arteries    cardiac catheterization performed  10/31/14  . RA (rheumatoid arthritis) ([place])    has been off methotreaxte since 10/13.  [name] Shortness of breath   . Thyroid disease   . URI (upper respiratory infection)     Family History  Problem Relation Age of Onset  . Diabetes Mother   . Heart attack Mother   . Hypertension Father   . Lung cancer Father   . Diabetes Sister   . Diabetes  Brother   . Hypertension Brother   . Heart disease Brother   . Heart attack Brother   . Kidney cancer Brother   . Uterine cancer Daughter   . Breast cancer Sister   . Rheum arthritis Maternal Uncle   . Gout Brother   . Kidney failure Brother     x 5   Past Surgical History:  Procedure Laterality Date  . ABDOMINAL HYSTERECTOMY    . BRAIN SURGERY     Gamma knife 10/13. Needs repeat spring  '14  . CARDIAC CATHETERIZATION N/A 10/31/2014   Procedure: Right/Left Heart Cath and Coronary Angiography;  Surgeon: [name], MD;  Location: [facility];  Service: Cardiovascular;  Laterality: N/A;  . ESOPHAGOGASTRODUODENOSCOPY (EGD) WITH PROPOFOL N/A 09/14/2014   Procedure: ESOPHAGOGASTRODUODENOSCOPY (EGD) WITH PROPOFOL;  Surgeon: Herbie Baltimore  Shaaron Adler, MD;  Location: [facility];  Service: Endoscopy;  Laterality: N/A;  . OVARY SURGERY    . SHOULDER SURGERY Left   . TONSILLECTOMY  age 64  . VIDEO BRONCHOSCOPY Bilateral 05/31/2013   Procedure: VIDEO BRONCHOSCOPY WITHOUT FLUORO;  Surgeon: Brand Males, MD;  Location: [facility];  Service: Cardiopulmonary;  Laterality: Bilateral;  . video bronscoscopy  2000   lung   Social History   Social History Narrative   Patient consumes 2-3 cups coffee per day.     Objective: Vital Signs: BP 107/65 (BP Location: Right Wrist, Patient Position: Sitting, Cuff Size: Large)   Pulse 89   Resp 14   Ht _0  (1.626 m)   Wt 292 lb (132.5 kg)   BMI 50.12 kg/m    Physical Exam  Constitutional: She is oriented to person, place, and time. She appears well-developed and well-nourished.  HENT:  Head: Normocephalic and atraumatic.  Eyes: EOM are normal. Pupils are equal, round, and reactive to light.  Cardiovascular: Normal rate, regular rhythm and normal heart sounds.  Exam reveals no gallop and no friction rub.   No murmur heard. Pulmonary/Chest: Effort normal and breath sounds normal. She has no wheezes. She has no rales.  Abdominal: Soft.  Bowel sounds are normal. She exhibits no distension. There is no tenderness. There is no guarding. No hernia.  Musculoskeletal: Normal range of motion. She exhibits no edema, tenderness or deformity.  Lymphadenopathy:    She has no cervical adenopathy.  Neurological: She is alert and oriented to person, place, and time. Coordination normal.  Skin: Skin is warm and dry. Capillary refill takes less than 2 seconds. No rash noted.  Psychiatric: She has a normal mood and affect. Her behavior is normal.     Musculoskeletal Exam:  Full range of motion of all joints Grip strength is equal and strong bilaterally For myalgia tender points are all absent  CDAI Exam: CDAI Homunculus Exam:   Joint Counts:  CDAI Tender Joint count: 0 CDAI Swollen Joint count: 0     Investigation: No additional findings.  Labs reviewed from September 2017 done at [facility]. 01/28/2016 shows CMP with GFR normal except for creatinine slightly elevated to 1.2 (we'll monitor).; GFR is normal at 53 Uric acid is normal at 5.0 Sedimentation rate is normal at 13 History of positive rheumatoid factor positive CCP positive ANA   Last CBC was done February 2017 which was within normal limits except except for slightly low WBCs    Imaging: No results found.  Speciality Comments: No specialty comments available.    Procedures:  Large Joint Inj Date/Time: 04/14/2016 2:08 PM Performed by: Eliezer Lofts Authorized by: Eliezer Lofts   Consent Given by:  Patient Site marked: the procedure site was marked   Timeout: prior to procedure the correct patient, procedure, and site was verified   Indications:  Pain Location:  Knee Site:  L knee Prep: patient was prepped and draped in usual sterile fashion   Needle Size:  27 G Needle Length:  1.5 inches Approach:  Medial Ultrasound Guidance: No   Fluoroscopic Guidance: No   Arthrogram: No   Medications:  1.5 mL lidocaine 1 %; 40 mg triamcinolone  acetonide 40 MG/ML Aspiration Attempted: Yes   Patient tolerance:  Patient tolerated the procedure well with no immediate complications  5 minutes after the injection patient's left knee pain was about 80% improved. She was able to bear weight on the left knee without much difficulty.  Note  that she has bone-on-bone but she is not eligible for surgery. She has to not only lose weight but according to the patient she cannot undergo surgery due to other medical issues.   Allergies: Codeine; Remicade [infliximab]; and Zestril [lisinopril]   Assessment / Plan:     Visit Diagnoses: Rheumatoid arthritis involving both hands with positive rheumatoid factor (HCC)  ILD (interstitial lung disease) ([place])  Primary osteoarthritis of right knee - Plan: diclofenac sodium (VOLTAREN) 1 % GEL  Morbid obesity (HCC)  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)  Chronic pain of left knee - Plan: diclofenac sodium (VOLTAREN) 1 % GEL  Lumbar facet arthropathy  Rheumatoid arthritis involving multiple sites, unspecified rheumatoid factor presence (HCC)  High risk medications (not anticoagulants) long-term use - Plan: CBC with Differential/Platelet, COMPLETE METABOLIC PANEL WITH GFR, Uric acid, VITAMIN D 25 Hydroxy (Vit-D Deficiency, Fractures)   Patient is complaining of significant left knee pain which she rates as 10 on a scale of 0-10. She is requesting a cortisone injection. She has a history of a warty taking prednisone which is affecting her sugars and she is keeping the sugar levels at a good rate. After informed consent was obtained the site was prepped in usual sterile fashion and injected with 40 mg of Kenalog mixed with one half mL to 1% lidocaine. Patient tolerated procedure well. There no complications. 5 minutes after the injection patient's left knee pain was about 80% improved. She was able to bear weight on the left knee without much difficulty. Note that she has bone-on-bone but  she is not eligible for surgery. She has to not only lose weight but according to the patient she cannot undergo surgery due to other medical issues.  For the OA of the knee joint, she uses Voltaren gel when necessary per medical advice. She is requesting refill in am happy to provide that for her.  She gets her allopurinol and Plaquenil from the tube doctor. She recently saw them September 2017 and she has adequate amount of those medications.  Patient is having lower extremity swelling consistent with peripheral edema which she needs to have addressed with her PCP. Patient has plans to go see them soon. In the meanwhile I've encouraged the patient to elevate the legs & continue to wear compression stockings.  Orders: Orders Placed This Encounter  Procedures  . CBC with Differential/Platelet  . COMPLETE METABOLIC PANEL WITH GFR  . Uric acid  . VITAMIN D 25 Hydroxy (Vit-D Deficiency, Fractures)   Meds ordered this encounter  Medications  . diclofenac sodium (VOLTAREN) 1 % GEL    Sig: Apply 4 g topically 4 (four) times daily. Voltaren Gel 3 grams to 3 large joints upto TID 3 TUBES with 3 refills    Dispense:  3 Tube    Refill:  3    Voltaren Gel 3 grams to 3 large joints upto TID 3 TUBES with 3 refills    Order Specific Question:   Supervising Provider    Answer:   Bo Merino [id number]    Face-to-face time spent with patient was 30 minutes. 50% of time was spent in counseling and coordination of care.  Follow-Up Instructions: Return in about 5 months (around 09/12/2016) for RA, [place], gout, OAKJ, .   Eliezer Lofts, PA-C

## 2016-04-15 LAB — COMPLETE METABOLIC PANEL WITH GFR
ALT: 19 U/L (ref 6–29)
AST: 17 U/L (ref 10–35)
Albumin: 3.6 g/dL (ref 3.6–5.1)
Alkaline Phosphatase: 72 U/L (ref 33–130)
BUN: 20 mg/dL (ref 7–25)
CALCIUM: 8.4 mg/dL — AB (ref 8.6–10.4)
CHLORIDE: 102 mmol/L (ref 98–110)
CO2: 32 mmol/L — ABNORMAL HIGH (ref 20–31)
Creat: 1.25 mg/dL — ABNORMAL HIGH (ref 0.60–0.93)
GFR, EST AFRICAN AMERICAN: 49 mL/min — AB (ref >=60)
GFR, Est Non African American: 42 mL/min — ABNORMAL LOW (ref >=60)
Glucose, Bld: 125 mg/dL — ABNORMAL HIGH (ref 65–99)
POTASSIUM: 4.2 mmol/L (ref 3.5–5.3)
Sodium: 142 mmol/L (ref 135–146)
Total Bilirubin: 0.4 mg/dL (ref 0.2–1.2)
Total Protein: 6.1 g/dL (ref 6.1–8.1)

## 2016-04-15 LAB — URIC ACID: URIC ACID, SERUM: 4.8 mg/dL (ref 2.5–7.0)

## 2016-04-15 LAB — VITAMIN D 25 HYDROXY (VIT D DEFICIENCY, FRACTURES): VIT D 25 HYDROXY: 31 ng/mL (ref 30–100)

## 2016-04-16 DIAGNOSIS — T798XXD Other early complications of trauma, subsequent encounter: Secondary | ICD-10-CM | POA: Diagnosis not present

## 2016-04-16 DIAGNOSIS — I5032 Chronic diastolic (congestive) heart failure: Secondary | ICD-10-CM | POA: Diagnosis not present

## 2016-04-16 DIAGNOSIS — S81812D Laceration without foreign body, left lower leg, subsequent encounter: Secondary | ICD-10-CM | POA: Diagnosis not present

## 2016-04-16 DIAGNOSIS — I130 Hypertensive heart and chronic kidney disease with heart failure and stage 1 through stage 4 chronic kidney disease, or unspecified chronic kidney disease: Secondary | ICD-10-CM | POA: Diagnosis not present

## 2016-04-16 DIAGNOSIS — N182 Chronic kidney disease, stage 2 (mild): Secondary | ICD-10-CM | POA: Diagnosis not present

## 2016-04-16 DIAGNOSIS — E1122 Type 2 diabetes mellitus with diabetic chronic kidney disease: Secondary | ICD-10-CM | POA: Diagnosis not present

## 2016-04-17 ENCOUNTER — Telehealth: Payer: Self-pay | Admitting: Radiology

## 2016-04-17 DIAGNOSIS — I130 Hypertensive heart and chronic kidney disease with heart failure and stage 1 through stage 4 chronic kidney disease, or unspecified chronic kidney disease: Secondary | ICD-10-CM | POA: Diagnosis not present

## 2016-04-17 DIAGNOSIS — E1122 Type 2 diabetes mellitus with diabetic chronic kidney disease: Secondary | ICD-10-CM | POA: Diagnosis not present

## 2016-04-17 DIAGNOSIS — I5032 Chronic diastolic (congestive) heart failure: Secondary | ICD-10-CM | POA: Diagnosis not present

## 2016-04-17 DIAGNOSIS — N182 Chronic kidney disease, stage 2 (mild): Secondary | ICD-10-CM | POA: Diagnosis not present

## 2016-04-17 DIAGNOSIS — S81812D Laceration without foreign body, left lower leg, subsequent encounter: Secondary | ICD-10-CM | POA: Diagnosis not present

## 2016-04-17 DIAGNOSIS — T798XXD Other early complications of trauma, subsequent encounter: Secondary | ICD-10-CM | POA: Diagnosis not present

## 2016-04-17 NOTE — Telephone Encounter (Signed)
I have called patient to advise labs are normal

## 2016-04-17 NOTE — Progress Notes (Signed)
Tell patient:  #1: Creatinine is 1.25 consistent with past labs and GFR is 49.  #2 CBC with differential is normal except for mild anemia. Consistent with past labs.Vitamin D is normal.No change in treatment.

## 2016-04-17 NOTE — Telephone Encounter (Signed)
-----  Message from Eliezer Lofts, [place] sent at 04/17/2016  4:27 PM EST ----- Tell patient:  #1: Creatinine is 1.25 consistent with past labs and GFR is 49.  #2 CBC with differential is normal except for mild anemia. Consistent with past labs.Vitamin D is normal.No change in treatment.

## 2016-04-18 ENCOUNTER — Encounter: Payer: Medicare Other | Attending: Nurse Practitioner | Admitting: Nurse Practitioner

## 2016-04-18 DIAGNOSIS — L97222 Non-pressure chronic ulcer of left calf with fat layer exposed: Secondary | ICD-10-CM | POA: Diagnosis not present

## 2016-04-18 DIAGNOSIS — N189 Chronic kidney disease, unspecified: Secondary | ICD-10-CM | POA: Insufficient documentation

## 2016-04-18 DIAGNOSIS — L97221 Non-pressure chronic ulcer of left calf limited to breakdown of skin: Secondary | ICD-10-CM | POA: Diagnosis not present

## 2016-04-18 DIAGNOSIS — Z79899 Other long term (current) drug therapy: Secondary | ICD-10-CM | POA: Insufficient documentation

## 2016-04-18 DIAGNOSIS — E1122 Type 2 diabetes mellitus with diabetic chronic kidney disease: Secondary | ICD-10-CM | POA: Diagnosis not present

## 2016-04-18 DIAGNOSIS — I890 Lymphedema, not elsewhere classified: Secondary | ICD-10-CM | POA: Insufficient documentation

## 2016-04-18 DIAGNOSIS — E11622 Type 2 diabetes mellitus with other skin ulcer: Secondary | ICD-10-CM | POA: Insufficient documentation

## 2016-04-18 DIAGNOSIS — J449 Chronic obstructive pulmonary disease, unspecified: Secondary | ICD-10-CM | POA: Insufficient documentation

## 2016-04-18 DIAGNOSIS — Z6841 Body Mass Index (BMI) 40.0 and over, adult: Secondary | ICD-10-CM | POA: Diagnosis not present

## 2016-04-18 DIAGNOSIS — I129 Hypertensive chronic kidney disease with stage 1 through stage 4 chronic kidney disease, or unspecified chronic kidney disease: Secondary | ICD-10-CM | POA: Diagnosis not present

## 2016-04-18 DIAGNOSIS — E1140 Type 2 diabetes mellitus with diabetic neuropathy, unspecified: Secondary | ICD-10-CM | POA: Insufficient documentation

## 2016-04-18 DIAGNOSIS — M069 Rheumatoid arthritis, unspecified: Secondary | ICD-10-CM | POA: Insufficient documentation

## 2016-04-20 NOTE — Progress Notes (Signed)
CLAUDENE, GATLIFF ([id number]) Visit Report for 04/18/2016 Arrival Information Details Patient Name: [name], [name]. Date of Service: 04/18/2016 12:30 PM Medical Record Number: [id number] Patient Account Number: [id number] Date of Birth/Sex: 10-23-1940 (75 y.o. Female) Treating RN: Montey Hora Primary Care Physician: Glendale Chard Other Clinician: Referring Physician: Glendale Chard Treating Physician/Extender: Cathie Olden in Treatment: 11 Visit Information History Since Last Visit Added or deleted any medications: No Patient Arrived: Walker Any new allergies or adverse reactions: No Arrival Time: 12:42 Had a fall or experienced change in No Accompanied By: self activities of daily living that may affect Transfer Assistance: None risk of falls: Patient Identification Verified: Yes Signs or symptoms of abuse/neglect since last No Secondary Verification Process Completed: Yes visito Patient Has Alerts: Yes Hospitalized since last visit: No Patient Alerts: DM II Pain Present Now: No Electronic Signature(s) Signed: 04/18/2016 5:08:45 PM By: Montey Hora Entered By: Montey Hora on 04/18/2016 12:42:30 [name] ([id number]) -------------------------------------------------------------------------------- Encounter Discharge Information Details Patient Name: [name]. Date of Service: 04/18/2016 12:30 PM Medical Record Number: [id number] Patient Account Number: [id number] Date of Birth/Sex: 12/16/1940 (75 y.o. Female) Treating RN: Montey Hora Primary Care Physician: Glendale Chard Other Clinician: Referring Physician: Glendale Chard Treating Physician/Extender: Cathie Olden in Treatment: 11 Encounter Discharge Information Items Discharge Pain Level: 0 Discharge Condition: Stable Ambulatory Status: Walker Discharge Destination: Home Transportation: Private Auto Accompanied By: self Schedule Follow-up Appointment: Yes Medication  Reconciliation completed No and provided to Patient/Care Heath Tesler: Provided on Clinical Summary of Care: 04/18/2016 Form Type Recipient Paper Patient DG Electronic Signature(s) Signed: 04/18/2016 2:55:11 PM By: Montey Hora Previous Signature: 04/18/2016 1:13:04 PM Version By: Sharon Mt Entered By: Montey Hora on 04/18/2016 14:55:11 [name], Umaiza E. ([id number]) -------------------------------------------------------------------------------- Lower Extremity Assessment Details Patient Name: [name]. Date of Service: 04/18/2016 12:30 PM Medical Record Number: [id number] Patient Account Number: [id number] Date of Birth/Sex: 23-Jul-1940 (75 y.o. Female) Treating RN: Montey Hora Primary Care Physician: Glendale Chard Other Clinician: Referring Physician: Glendale Chard Treating Physician/Extender: Cathie Olden in Treatment: 11 Edema Assessment Assessed: [Left: No] [Right: No] Edema: [Left: Ye] [Right: s] Calf Left: Right: Point of Measurement: 34 cm From Medial Instep 52.8 cm cm Ankle Left: Right: Point of Measurement: 11 cm From Medial Instep 35 cm cm Vascular Assessment Pulses: Posterior Tibial Dorsalis Pedis Palpable: [Left:Yes] Extremity colors, hair growth, and conditions: Extremity Color: [Left:Hyperpigmented] Hair Growth on Extremity: [Left:No] Temperature of Extremity: [Left:Warm] Capillary Refill: [Left:< 3 seconds] Electronic Signature(s) Signed: 04/18/2016 5:08:45 PM By: Montey Hora Entered By: Montey Hora on 04/18/2016 12:52:28 [name] ([id number]) -------------------------------------------------------------------------------- Multi Wound Chart Details Patient Name: [name]. Date of Service: 04/18/2016 12:30 PM Medical Record Number: [id number] Patient Account Number: [id number] Date of Birth/Sex: May 20, 1940 (75 y.o. 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 N/A N/A Comorbid History: Cataracts, Lymphedema, N/A N/A Hypertension, Type II Diabetes, Gout, Rheumatoid Arthritis, Osteoarthritis, Neuropathy Date Acquired: 12/27/2015 N/A N/A Weeks of Treatment: 11 N/A N/A Wound Status: Open N/A N/A Measurements L x W x D 2.6x2.4x0.3 N/A N/A (cm) Area (cm) : 4.901 N/A N/A Volume (cm) :  1.47 N/A N/A % Reduction in Area: 75.20% N/A N/A % Reduction in Volume: 62.90% N/A N/A Classification: Grade 2 N/A N/A Exudate Amount: Medium N/A N/A Exudate Type: Sanguinous N/A N/A Exudate Color: red N/A N/A Wound Margin: Distinct, outline attached N/A N/A Falwell, Crislyn E. ([id number]) Granulation Amount: Large (67-100%) N/A N/A Granulation Quality: Red N/A N/A Necrotic Amount: Small (1-33%) N/A N/A Exposed Structures: Fascia: No N/A N/A Fat: No Tendon: No Muscle: No Joint: No Bone: No Limited to Skin Breakdown Epithelialization: Medium (34-66%) N/A N/A Periwound Skin Texture: Edema: Yes N/A N/A Scarring: Yes Excoriation: No Induration: No Callus: No Crepitus: No Fluctuance: No Friable: No Rash: No Periwound Skin Moist: Yes N/A N/A Moisture: Maceration: No Dry/Scaly: No Periwound Skin Color: Ecchymosis: Yes N/A N/A Hemosiderin Staining: Yes Atrophie Blanche: No Cyanosis: No Erythema: No Mottled: No Pallor: No Rubor: No Temperature: No Abnormality N/A N/A Tenderness on Yes N/A N/A Palpation: Wound Preparation: Ulcer Cleansing: N/A  N/A Rinsed/Irrigated with Saline Topical Anesthetic Applied: Other: lidocaine 4% Treatment Notes Electronic Signature(s) Signed: 04/18/2016 5:08:45 PM By: Montey Hora Entered By: Montey Hora on 04/18/2016 12:53:07 [name] ([id number]) [name] ([id number]) -------------------------------------------------------------------------------- [place] Details Patient Name: [name] Date of Service: 04/18/2016 12:30 PM Medical Record Number: [id number] Patient Account Number: [id number] Date of Birth/Sex: [birth year] (75 y.o. Female) Treating RN: Montey Hora Primary Care Physician: Glendale Chard Other Clinician: Referring Physician: Glendale Chard Treating Physician/Extender: Cathie Olden in Treatment: 11 Active Inactive Abuse / Safety / Falls / Self Care Management Nursing Diagnoses: Impaired physical mobility Potential for falls Goals: Patient will remain injury free Date Initiated: 02/01/2016 Goal Status: Active Interventions: Assess fall risk on admission and as needed Notes: Orientation to the Wound Care Program Nursing Diagnoses: Knowledge deficit related to the wound healing center program Goals: Patient/caregiver will verbalize understanding of the [name] Date Initiated: 02/01/2016 Goal Status: Active Interventions: Provide education on orientation to the wound center Notes: Wound/Skin Impairment Nursing Diagnoses: Impaired tissue integrity Goals: Patient/caregiver will verbalize understanding of skin care regimen NAKYLA, BRACCO ([id number]) Date Initiated: 02/01/2016 Goal Status: Active Ulcer/skin breakdown will have a volume reduction of 30% by week 4 Date Initiated: 02/01/2016 Goal Status: Active Ulcer/skin breakdown will have a volume reduction of 50% by week 8 Date Initiated: 02/01/2016 Goal Status: Active Ulcer/skin breakdown will have a volume reduction of 80% by  week 12 Date Initiated: 02/01/2016 Goal Status: Active Ulcer/skin breakdown will heal within 14 weeks Date Initiated: 02/01/2016 Goal Status: Active Interventions: Assess patient/caregiver ability to obtain necessary supplies Assess patient/caregiver ability to perform ulcer/skin care regimen upon admission and as needed Assess ulceration(s) every visit Notes: Electronic Signature(s) Signed: 04/18/2016 5:08:45 PM By: Montey Hora Entered By: Montey Hora on 04/18/2016 12:52:58 [name] ([id number]) -------------------------------------------------------------------------------- Pain Assessment Details Patient Name: [name]. Date of Service: 04/18/2016 12:30 PM Medical Record Number: [id number] Patient Account Number: [id number] Date of Birth/Sex: 12-Apr-1941 (75 y.o. Female) Treating RN: Montey Hora Primary Care Physician: Glendale Chard Other Clinician: Referring Physician: Glendale Chard Treating Physician/Extender: Cathie Olden in Treatment: 11 Active Problems Location of Pain Severity and Description of Pain Patient Has Paino No Site Locations Pain Management and Medication Current Pain Management: Notes Topical or injectable lidocaine is offered to patient for acute pain when surgical debridement is performed. If needed, Patient is instructed to use over the counter pain medication for the following 24-48 hours after debridement. Wound care MDs do not prescribed pain medications. Patient  has chronic pain or uncontrolled pain. Patient has been instructed to make an appointment with their Primary Care Physician for pain management. Electronic Signature(s) Signed: 04/18/2016 5:08:45 PM By: Montey Hora Entered By: Montey Hora on 04/18/2016 12:42:42 [name] ([id number]) -------------------------------------------------------------------------------- Patient/Caregiver Education Details Patient Name: [name] Date of  Service: 04/18/2016 12:30 PM Medical Record Number: [id number] Patient Account Number: [id number] Date of Birth/Gender: 1941-05-04 (75 y.o. Female) Treating RN: Montey Hora Primary Care Physician: Glendale Chard Other Clinician: Referring Physician: Glendale Chard Treating Physician/Extender: Cathie Olden in Treatment: 11 Education Assessment Education Provided To: Patient Education Topics Provided Venous: Handouts: Controlling Swelling with Multilayered Compression Wraps, Other: wrap precautions Methods: Explain/Verbal, Printed Responses: State content correctly Electronic Signature(s) Signed: 04/18/2016 5:08:45 PM By: Montey Hora Entered By: Montey Hora on 04/18/2016 14:55:33 Ziska, Maryem E. ([id number]) -------------------------------------------------------------------------------- Wound Assessment Details Patient Name: [name]. Date of Service: 04/18/2016 12:30 PM Medical Record Number: [id number] Patient Account Number: [id number] Date of Birth/Sex: 1940-06-15 (75 y.o. Female) Treating RN: Montey Hora Primary Care Physician: Glendale Chard Other Clinician: Referring Physician: Glendale Chard Treating Physician/Extender: Cathie Olden in Treatment: 11 Wound Status Wound Number: 1 Primary Diabetic Wound/Ulcer of the Lower Etiology: Extremity Wound Location: Left Lower Leg - Lateral Secondary Auto-immune Wounding Event: Trauma Etiology: Date Acquired: 12/27/2015 Wound Open Weeks Of Treatment: 11 Status: Clustered Wound: No Comorbid Cataracts, Lymphedema, History: Hypertension, Type II Diabetes, Gout, Rheumatoid Arthritis, Osteoarthritis, Neuropathy Photos Wound Measurements Length: (cm) 2.6 Width: (cm) 2.4 Depth: (cm) 0.3 Area: (cm) 4.901 Volume: (cm) 1.47 % Reduction in Area: 75.2% % Reduction in Volume: 62.9% Epithelialization: Medium (34-66%) Tunneling: No Undermining: No Wound Description Classification: Grade  2 Wound Margin: Distinct, outline attached Exudate Amount: Medium Exudate Type: Sanguinous Exudate Color: red Wound Bed Granulation Amount: Large (67-100%) Exposed Structure Granulation Quality: Red Fascia Exposed: No Necrotic Amount: Small (1-33%) Fat Layer Exposed: No Necrotic Quality: Adherent Slough Tendon Exposed: No Beltran, Deysha E. ([id number]) Muscle Exposed: No Joint Exposed: No Bone Exposed: No Limited to Skin Breakdown Periwound Skin Texture Texture Color No Abnormalities Noted: No No Abnormalities Noted: No Callus: No Atrophie Blanche: No Crepitus: No Cyanosis: No Excoriation: No Ecchymosis: Yes Fluctuance: No Erythema: No Friable: No Hemosiderin Staining: Yes Induration: No Mottled: No Localized Edema: Yes Pallor: No Rash: No Rubor: No Scarring: Yes Temperature / Pain Moisture Temperature: No Abnormality No Abnormalities Noted: No Tenderness on Palpation: Yes Dry / Scaly: No Maceration: No Moist: Yes Wound Preparation Ulcer Cleansing: Rinsed/Irrigated with Saline Topical Anesthetic Applied: Other: lidocaine 4%, Treatment Notes Wound #1 (Left, Lateral Lower Leg) 1. Cleansed with: Clean wound with Normal Saline 2. Anesthetic Topical Lidocaine 4% cream to wound bed prior to debridement 4. Dressing Applied: Other dressing (specify in notes) 5. Secondary Dressing Applied ABD Pad 7. Secured with 3 Layer Compression System - Left Lower Extremity Notes polymem with silver Electronic Signature(s) Signed: 04/18/2016 5:08:45 PM By: Montey Hora Entered By: Montey Hora on 04/18/2016 12:52:50 [name] ([id number]) [name], Edwena Felty ([id number]) -------------------------------------------------------------------------------- Vitals Details Patient Name: [name]. Date of Service: 04/18/2016 12:30 PM Medical Record Number: [id number] Patient Account Number: [id number] Date of Birth/Sex: 1940-12-24 (75 y.o.  Female) Treating RN: Montey Hora Primary Care Physician: Glendale Chard Other Clinician: Referring Physician: Glendale Chard Treating Physician/Extender: Cathie Olden in Treatment: 11 Vital Signs Time Taken: 12:42 Temperature (F): 98.1 Height (in): 64 Pulse (bpm): 77 Weight (lbs): 283 Respiratory Rate (breaths/min): 20 Body Mass Index (BMI): 48.6 Blood Pressure (mmHg): 112/87 Reference  Range: 80 - 120 mg / dl Electronic Signature(s) Signed: 04/18/2016 5:08:45 PM By: Montey Hora Entered By: Montey Hora on 04/18/2016 12:48:28

## 2016-04-20 NOTE — Progress Notes (Addendum)
[name], [name] ([id number]) Visit Report for 04/18/2016 Chief Complaint Document Details Patient Name: [name], [name] 04/18/2016 12:30 Date of Service: PM Medical Record [id number] Number: Patient Account Number: [id number] [birth year] (75 y.o. Treating RN: Montey Hora Date of Birth/Sex: Female) Other Clinician: Primary Care Physician: Glendale Chard Treating Josue Kass Referring Physician: Glendale Chard Physician/Extender: Suella Grove in Treatment: 11 Information Obtained from: Patient Chief Complaint Ms. Lau presents for evaluation of her left bilateral leg ulcer Electronic Signature(s) Signed: 04/18/2016 12:57:56 PM By: Rene Kocher, NP, Abeera Flannery Entered By: Rene Kocher, NP, Cortana Vanderford on 04/18/2016 12:57:56 [name] ([id number]) -------------------------------------------------------------------------------- HPI Details Patient Name: [name] 04/18/2016 12:30 Date of Service: PM Medical Record [id number] Number: Patient Account Number: [id number] September 04, 1940 (75 y.o. Treating RN: Montey Hora Date of Birth/Sex: Female) Other Clinician: Primary Care Physician: Glendale Chard Treating Eliezer Khawaja Referring Physician: Glendale Chard Physician/Extender: Weeks in Treatment: 11 History of Present Illness Location: left lateral lower extremity Quality: denies pain Severity: denies pain Duration: ulcer has been present for several months Timing: denies pain Context: traumatic injury HPI Description: 75 year old patient with a past medical history of diabetes mellitus, chronic kidney disease, COPD, hypertension, rheumatoid arthritis had a lacerated wound to her left calf when she injured it on the corner of a car door and it was a long 12 cm laceration. x-ray done at the time revealed that there was no fracture or radiopaque foreign body. the wound was sutured with 4-0 Prolene simple interrupted sutures and a total of 14 sutures were applied. on August 31 she  was seen in the ER and it was noted that the wound had dehisced and she was treated with Keflex. She has completed a course of doxycycline recently Medical history also significant for status post abdominal hysterectomy, brain surgery, cardiac catheterization, EGD and bronchoscopy. of late she was back in the ER and was seen last on August 31 and they tried Silvadene 02/22/16: pt reports discomfort and mild pain just below her wound. however, she admits on legs more than usual. no redness, foul odor or purulence. the wound is measuring slightly larger today, but likely secondary to poor edema control. nursing staff gave information to pt today regarding purchase of her compression stockings. she was encouraged to purchase soon. 02/29/16: returns today for f/u. she denies systemic s/s of infection. she brought her new compression stockings today. 03/21/2016 -- She has been put on an antibiotic and prednisone for a bronchitis by her pulmonologist. 04/04/2016 -- she continues to be on prednisone. 04-18-2016 Ms. Keinath is for evaluation of her left lateral leg ulcer. She states that she is still on a titrating dose of prednisone. She also states that she recently received a steroid injection to her left knee. She states that her blood sugars have elevated slightly with the max reading in the 130s. She denies any pain or discomfort to her left leg ulcer. She states that she has been unable to wear her compression stockings and as a result has ankle increased edema to the left leg. Electronic Signature(s) Signed: 04/18/2016 1:00:42 PM By: Rene Kocher, NP, Guido Comp Entered By: Rene Kocher, NP, Kenise Barraco on 04/18/2016 13:00:42 [name], [name] ([id number]) [name], [name] ([id number]) -------------------------------------------------------------------------------- Physical Exam Details Patient Name: [name], [name] 04/18/2016 12:30 Date of Service: PM Medical Record [id number] Number: Patient Account  Number: [id number] [birth year] (75 y.o. Treating RN: Montey Hora Date of Birth/Sex: Female) Other Clinician: Primary Care Physician: Glendale Chard Treating Tanisia Yokley Referring Physician: Glendale Chard Physician/Extender: Weeks in Treatment: 11 Constitutional BP  within normal limits. afebrile. well nourished; well developed; appears stated age;[name] Respiratory non-labored respiratory effort. Cardiovascular palpable DP, non-palpable PT. LLE- hemosiderin staining, edema. Musculoskeletal ambulates with assistive device. Integumentary (Hair, Skin) no periwound erythema, no tissue necrosis, 100% granulation tissue. no induration, no fluctuance, denies pain. Psychiatric appears to make sound judgement and have accurate insight regarding healthcare. oriented to time, place, person and situation. calm, pleasant, conversive. Electronic Signature(s) Signed: 04/18/2016 1:02:42 PM By: Rene Kocher, NP, Jana Hakim By: Rene Kocher, NP, Amerika Nourse on 04/18/2016 13:02:42 BROCK, MOKRY ([id number]) -------------------------------------------------------------------------------- Physician Orders Details Patient Name: ANEIRA, CAVITT 04/18/2016 12:30 Date of Service: PM Medical Record [id number] Number: Patient Account Number: [id number] 12-30-1940 (75 y.o. Treating RN: Montey Hora Date of Birth/Sex: Female) Other Clinician: Primary Care Physician: Glendale Chard Treating Jalesia Loudenslager Referring Physician: Glendale Chard Physician/Extender: Suella Grove in Treatment: 11 Verbal / Phone Orders: Yes Clinician: Montey Hora Read Back and Verified: Yes Diagnosis Coding Wound Cleansing Wound #1 Left,Lateral Lower Leg o Clean wound with Normal Saline. o May Shower, gently pat wound dry prior to applying new dressing. Anesthetic Wound #1 Left,Lateral Lower Leg o Topical Lidocaine 4% cream applied to wound bed prior to debridement Skin Barriers/Peri-Wound Care Wound #1 Left,Lateral Lower  Leg o Skin Prep Primary Wound Dressing Wound #1 Left,Lateral Lower Leg o Other: - polymem with silver - HHRN please try to get this product for patient to use Secondary Dressing Wound #1 Left,Lateral Lower Leg o ABD pad o Dry Gauze Dressing Change Frequency Wound #1 Left,Lateral Lower Leg o Other: - twice weekly - once by [facility] and once at [place] Follow-up Appointments Wound #1 Left,Lateral Lower Leg o Return Appointment in 1 week. Edema Control Wound #1 Left,Lateral Lower Leg o 3 Layer Compression System - Left Lower Extremity DERHONDA, EASTLICK ([id number]) Home Health Wound #1 Left,Lateral Lower Leg o [place] Visits - [place] Nurse may visit PRN to address patientos wound care needs. o FACE TO FACE ENCOUNTER: MEDICARE and MEDICAID PATIENTS: I certify that this patient is under my care and that I had a face-to-face encounter that meets the physician face-to-face encounter requirements with this patient on this date. The encounter with the patient was in whole or in part for the following MEDICAL CONDITION: (primary reason for [place]) MEDICAL NECESSITY: I certify, that based on my findings, NURSING services are a medically necessary home health service. HOME BOUND STATUS: I certify that my clinical findings support that this patient is homebound (i.e., Due to illness or injury, pt requires aid of supportive devices such as crutches, cane, wheelchairs, walkers, the use of special transportation or the assistance of another person to leave their place of residence. There is a normal inability to leave the home and doing so requires considerable and taxing effort. Other absences are for medical reasons / religious services and are infrequent or of short duration when for other reasons). o If current dressing causes regression in wound condition, may D/C ordered dressing product/s and apply Normal Saline  Moist Dressing daily until next [facility] / Other MD appointment. [place] of regression in wound condition at [phone]. o Please direct any NON-WOUND related issues/requests for orders to patient's Primary Care Physician Electronic Signature(s) Signed: 04/18/2016 5:08:45 PM By: Montey Hora Signed: 04/18/2016 8:21:50 PM By: Rene Kocher, NP, [place] Entered By: Montey Hora on 04/18/2016 12:57:04 [name] ([id number]) -------------------------------------------------------------------------------- Problem List Details Patient Name: [name], [name] 04/18/2016 12:30 Date of Service: PM  Medical Record [id number] Number: Patient Account Number: [id number] [birth year] (75 y.o. 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 Treating RN: Montey Hora Date of Birth/Sex: Female) Other Clinician: Primary Care Physician: Glendale Chard Treating Alixandria Friedt Referring Physician: Glendale Chard Physician/Extender: Suella Grove in Treatment:  11 Subjective Chief Complaint Information obtained from Patient Ms. Lobello presents for evaluation of her left bilateral leg ulcer History of Present Illness (HPI) The following HPI elements were documented for the patient's wound: Location: left lateral lower extremity Quality: denies pain Severity: denies pain Duration: ulcer has been present for several months Timing: denies pain Context: traumatic injury 75 year old patient with a past medical history of diabetes mellitus, chronic kidney disease, COPD, hypertension, rheumatoid arthritis had a lacerated wound to her left calf when she injured it on the corner of a car door and it was a long 12 cm laceration. x-ray done at the time revealed that there was no fracture or radiopaque foreign body. the wound was sutured with 4-0 Prolene simple interrupted sutures and a total of 14 sutures were applied. on August 31 she was seen in the ER and it was noted that the wound had dehisced and she was treated with Keflex. She has completed a course of doxycycline recently Medical history also significant for status post abdominal hysterectomy, brain surgery, cardiac catheterization, EGD and bronchoscopy. of late she was back in the ER and was seen last on August 31 and they tried Silvadene 02/22/16: pt reports discomfort and mild pain just below her wound. however, she admits on legs more than usual. no redness, foul odor or purulence. the wound is measuring slightly larger today, but likely secondary to poor edema control. nursing staff gave information to pt today regarding purchase of her compression stockings. she was encouraged to purchase soon. 02/29/16: returns today for f/u. she denies systemic s/s of infection. she brought her new compression stockings today. 03/21/2016 -- She has been put on an antibiotic and prednisone for a bronchitis by her pulmonologist. 04/04/2016 -- she continues to be on prednisone. 04-18-2016 Ms. Tyndall is for  evaluation of her left lateral leg ulcer. She states that she is still on a titrating dose of prednisone. She also states that she recently received a steroid injection to her left knee. She states that her blood sugars have elevated slightly with the max reading in the 130s. She denies any pain or [name], [name] ([id number]) discomfort to her left leg ulcer. She states that she has been unable to wear her compression stockings and as a result has ankle increased edema to the left leg. Objective Constitutional BP within normal limits. afebrile. well nourished; well developed; appears stated age;[name] Vitals Time Taken: 12:42 PM, Height: 64 in, Weight: 283 lbs, BMI: 48.6, Temperature: 98.1 F, Pulse: 77 bpm, Respiratory Rate: 20 breaths/min, Blood Pressure: 112/87 mmHg. Respiratory non-labored respiratory effort.  Cardiovascular palpable DP, non-palpable PT. LLE- hemosiderin staining, edema. Musculoskeletal ambulates with assistive device. Psychiatric appears to make sound judgement and have accurate insight regarding healthcare. oriented to time, place, person and situation. calm, pleasant, conversive. Integumentary (Hair, Skin) no periwound erythema, no tissue necrosis, 100% granulation tissue. no induration, no fluctuance, denies pain. Wound #1 status is Open. Original cause of wound was Trauma. The wound is located on the Left,Lateral Lower Leg. The wound measures 2.6cm length x 2.4cm width x 0.3cm depth; 4.901cm^2 area and 1.47cm^3 volume. The wound is limited to skin breakdown. There is no tunneling or undermining noted. There is a medium amount of sanguinous drainage noted. The wound margin is distinct with the outline attached to the wound base. There is large (67-100%) red granulation within the wound bed. There is a small (1-33%) amount of necrotic tissue within the wound bed including Adherent Slough. The periwound skin appearance exhibited: Localized Edema, Scarring, Moist,  Ecchymosis, Hemosiderin Staining. The periwound skin appearance did not exhibit: Callus, Crepitus, Excoriation, Fluctuance, Friable, Induration, Rash, Dry/Scaly, Maceration, Atrophie Blanche, Cyanosis, Mottled, Pallor, Rubor, Erythema. Periwound temperature was noted as No Abnormality. The periwound has tenderness on palpation. [name], [name] ([id number]) Assessment Active Problems ICD-10 E11.622 - Type 2 diabetes mellitus with other skin ulcer L97.222 - Non-pressure chronic ulcer of left calf with fat layer exposed I89.0 - Lymphedema, not elsewhere classified Plan Wound Cleansing: Wound #1 Left,Lateral Lower Leg: Clean wound with Normal Saline. May Shower, gently pat wound dry prior to applying new dressing. Anesthetic: Wound #1 Left,Lateral Lower Leg: Topical Lidocaine 4% cream applied to wound bed prior to debridement Skin Barriers/Peri-Wound Care: Wound #1 Left,Lateral Lower Leg: Skin Prep Primary Wound Dressing: Wound #1 Left,Lateral Lower Leg: Other: - polymem with silver - HHRN please try to get this product for patient to use Secondary Dressing: Wound #1 Left,Lateral Lower Leg: ABD pad Dry Gauze Dressing Change Frequency: Wound #1 Left,Lateral Lower Leg: Other: - twice weekly - once by [facility] and once at [place] Follow-up Appointments: Wound #1 Left,Lateral Lower Leg: Return Appointment in 1 week. Edema Control: Wound #1 Left,Lateral Lower Leg: 3 Layer Compression System - Left Lower Extremity Home Health: Wound #1 Left,Lateral Lower Leg: Continue Home Health Visits - [facility] Nurse may visit PRN to address patient s wound care needs. FACE TO FACE ENCOUNTER: MEDICARE and MEDICAID PATIENTS: I certify that this patient is under my care and that I had a face-to-face encounter that meets the physician face-to-face encounter requirements with this patient on this date. The encounter with the patient was in whole or in part for  the following MEDICAL CONDITION: (primary reason for [place]) MEDICAL NECESSITY: I certify, [name], [name] ([id number]) that based on my findings, NURSING services are a medically necessary home health service. HOME BOUND STATUS: I certify that my clinical findings support that this patient is homebound (i.e., Due to illness or injury, pt requires aid of supportive devices such as crutches, cane, wheelchairs, walkers, the use of special transportation or the assistance of another person to leave their place of residence. There is a normal inability to leave the home and doing so requires considerable and taxing effort. Other absences are for medical reasons / religious services and are infrequent or of short duration when for other reasons). If current dressing causes regression in wound condition, may D/C ordered dressing product/s and apply Normal Saline Moist Dressing daily until next [place] / Other MD appointment. Notify [facility] of regression in wound condition at [phone]. Please direct any NON-WOUND related issues/requests for orders to patient's Primary Care Physician Follow-Up Appointments: A follow-up appointment should be scheduled. A Patient Clinical Summary of Care was provided to [facility] 1. Will continue with PolyMem 2. Will apply Profore light compression (ABI on record is 1.16) 3. Will follow-up next week Electronic Signature(s) Signed: 04/18/2016 8:25:50 PM By: Rene Kocher, NP, Tyrrell Stephens Previous Signature: 04/18/2016 1:03:58 PM Version By: Rene Kocher, NP, Dantavious Snowball Previous Signature: 04/18/2016 1:03:07 PM Version By: Rene Kocher, NP, Countess Biebel Entered By: Rene Kocher, NP, Reet Scharrer on 04/18/2016 20:25:50 [name] ([id number]) -------------------------------------------------------------------------------- SuperBill Details Patient Name: [name]. Date of Service: 04/18/2016 Medical Record Number: [id number] Patient Account Number: [id number] Date of  Birth/Sex: December 05, 1940 (75 y.o. Female) Treating RN: Montey Hora Primary Care Physician: Glendale Chard Other Clinician: Referring Physician: Glendale Chard Treating Physician/Extender: Cathie Olden in Treatment: 11 Diagnosis Coding ICD-10 Codes Code Description E11.622 Type 2 diabetes mellitus with other skin ulcer L97.222 Non-pressure chronic ulcer of left calf with fat layer exposed I89.0 Lymphedema, not elsewhere classified Facility Procedures CPT4: Description Modifier Quantity Code [id number] (Facility Use Only) [phone] - APPLY [place] [facility] 1 LEG Physician Procedures CPT4 Code: [id number] Description: 45364 - WC PHYS LEVEL 3 - EST PT ICD-10 Description Diagnosis E11.622 Type 2 diabetes mellitus with other skin ulcer L97.222 Non-pressure chronic ulcer of left calf with fat I89.0 Lymphedema, not elsewhere classified Modifier: layer exposed Quantity: 1 Electronic Signature(s) Signed: 04/18/2016 2:54:18 PM By: Montey Hora Signed: 04/18/2016 8:21:50 PM By: Rene Kocher, NP, Kevin Space Previous Signature: 04/18/2016 1:04:11 PM Version By: Rene Kocher, NP, Aundreya Souffrant Previous Signature: 04/18/2016 1:03:26 PM Version By: Rene Kocher, NP, Takahiro Godinho Entered By: Montey Hora on 04/18/2016 14:54:17

## 2016-04-22 DIAGNOSIS — S81812D Laceration without foreign body, left lower leg, subsequent encounter: Secondary | ICD-10-CM | POA: Diagnosis not present

## 2016-04-22 DIAGNOSIS — I130 Hypertensive heart and chronic kidney disease with heart failure and stage 1 through stage 4 chronic kidney disease, or unspecified chronic kidney disease: Secondary | ICD-10-CM | POA: Diagnosis not present

## 2016-04-22 DIAGNOSIS — E1122 Type 2 diabetes mellitus with diabetic chronic kidney disease: Secondary | ICD-10-CM | POA: Diagnosis not present

## 2016-04-22 DIAGNOSIS — T798XXD Other early complications of trauma, subsequent encounter: Secondary | ICD-10-CM | POA: Diagnosis not present

## 2016-04-22 DIAGNOSIS — I5032 Chronic diastolic (congestive) heart failure: Secondary | ICD-10-CM | POA: Diagnosis not present

## 2016-04-22 DIAGNOSIS — N182 Chronic kidney disease, stage 2 (mild): Secondary | ICD-10-CM | POA: Diagnosis not present

## 2016-04-24 DIAGNOSIS — T798XXD Other early complications of trauma, subsequent encounter: Secondary | ICD-10-CM | POA: Diagnosis not present

## 2016-04-24 DIAGNOSIS — I5032 Chronic diastolic (congestive) heart failure: Secondary | ICD-10-CM | POA: Diagnosis not present

## 2016-04-24 DIAGNOSIS — E1122 Type 2 diabetes mellitus with diabetic chronic kidney disease: Secondary | ICD-10-CM | POA: Diagnosis not present

## 2016-04-24 DIAGNOSIS — N182 Chronic kidney disease, stage 2 (mild): Secondary | ICD-10-CM | POA: Diagnosis not present

## 2016-04-24 DIAGNOSIS — S81812D Laceration without foreign body, left lower leg, subsequent encounter: Secondary | ICD-10-CM | POA: Diagnosis not present

## 2016-04-24 DIAGNOSIS — I130 Hypertensive heart and chronic kidney disease with heart failure and stage 1 through stage 4 chronic kidney disease, or unspecified chronic kidney disease: Secondary | ICD-10-CM | POA: Diagnosis not present

## 2016-04-25 ENCOUNTER — Encounter: Payer: Medicare Other | Admitting: Surgery

## 2016-04-25 DIAGNOSIS — E1122 Type 2 diabetes mellitus with diabetic chronic kidney disease: Secondary | ICD-10-CM | POA: Diagnosis not present

## 2016-04-25 DIAGNOSIS — E11622 Type 2 diabetes mellitus with other skin ulcer: Secondary | ICD-10-CM | POA: Diagnosis not present

## 2016-04-25 DIAGNOSIS — L97222 Non-pressure chronic ulcer of left calf with fat layer exposed: Secondary | ICD-10-CM | POA: Diagnosis not present

## 2016-04-25 DIAGNOSIS — L928 Other granulomatous disorders of the skin and subcutaneous tissue: Secondary | ICD-10-CM | POA: Diagnosis not present

## 2016-04-25 DIAGNOSIS — J449 Chronic obstructive pulmonary disease, unspecified: Secondary | ICD-10-CM | POA: Diagnosis not present

## 2016-04-25 DIAGNOSIS — N189 Chronic kidney disease, unspecified: Secondary | ICD-10-CM | POA: Diagnosis not present

## 2016-04-25 DIAGNOSIS — I890 Lymphedema, not elsewhere classified: Secondary | ICD-10-CM | POA: Diagnosis not present

## 2016-04-26 NOTE — Progress Notes (Signed)
ZONIA, CAPLIN ([id number]) Visit Report for 04/25/2016 Chief Complaint Document Details Patient Name: [name], [name] 04/25/2016 10:45 Date of Service: AM Medical Record [id number] Number: Patient Account Number: [id number] 05-Oct-1940 (75 y.o. Treating RN: Montey Hora Date of Birth/Sex: Female) Other Clinician: Primary Care Physician: Glendale Chard Treating Christin Fudge Referring Physician: Glendale Chard Physician/Extender: Suella Grove in Treatment: 12 Information Obtained from: Patient Chief Complaint Ms. Larsen presents for evaluation of her left bilateral leg ulcer Electronic Signature(s) Signed: 04/25/2016 11:30:37 AM By: Christin Fudge MD, FACS Entered By: Christin Fudge on 04/25/2016 11:30:37 [name] ([id number]) -------------------------------------------------------------------------------- HPI Details Patient Name: [name] 04/25/2016 10:45 Date of Service: AM Medical Record [id number] Number: Patient Account Number: [id number] 07-Jun-1940 (75 y.o. Treating RN: Montey Hora Date of Birth/Sex: Female) Other Clinician: Primary Care Physician: Glendale Chard Treating Fauna Neuner Referring Physician: Glendale Chard Physician/Extender: Weeks in Treatment: 12 History of Present Illness Location: left lateral lower extremity Quality: denies pain Severity: denies pain Duration: ulcer has been present for several months Timing: denies pain Context: traumatic injury HPI Description: 75 year old patient with a past medical history of diabetes mellitus, chronic kidney disease, COPD, hypertension, rheumatoid arthritis had a lacerated wound to her left calf when she injured it on the corner of a car door and it was a long 12 cm laceration. x-ray done at the time revealed that there was no fracture or radiopaque foreign body. the wound was sutured with 4-0 Prolene simple interrupted sutures and a total of 14 sutures were applied. on August 31 she  was seen in the ER and it was noted that the wound had dehisced and she was treated with Keflex. She has completed a course of doxycycline recently Medical history also significant for status post abdominal hysterectomy, brain surgery, cardiac catheterization, EGD and bronchoscopy. of late she was back in the ER and was seen last on August 31 and they tried Silvadene 02/22/16: pt reports discomfort and mild pain just below her wound. however, she admits on legs more than usual. no redness, foul odor or purulence. the wound is measuring slightly larger today, but likely secondary to poor edema control. nursing staff gave information to pt today regarding purchase of her compression stockings. she was encouraged to purchase soon. 02/29/16: returns today for f/u. she denies systemic s/s of infection. she brought her new compression stockings today. 03/21/2016 -- She has been put on an antibiotic and prednisone for a bronchitis by her pulmonologist. 04/04/2016 -- she continues to be on prednisone. 04-18-2016 Ms. Evon is for evaluation of her left lateral leg ulcer. She states that she is still on a titrating dose of prednisone. She also states that she recently received a steroid injection to her left knee. She states that her blood sugars have elevated slightly with the max reading in the 130s. She denies any pain or discomfort to her left leg ulcer. She states that she has been unable to wear her compression stockings and as a result has ankle increased edema to the left leg. Electronic Signature(s) Signed: 04/25/2016 11:30:43 AM By: Christin Fudge MD, FACS Entered By: Christin Fudge on 04/25/2016 11:30:43 AMORE, ACKMAN ([id number]) BEAULAH, ROMANEK ([id number]) -------------------------------------------------------------------------------- Otelia Sergeant TISS Details Patient Name: [name] 04/25/2016 10:45 Date of Service: AM Medical  Record [id number] Number: Patient Account Number: [id number] 1941/03/07 (75 y.o. Treating RN: Montey Hora Date of Birth/Sex: Female) Other Clinician: Primary Care Physician: Glendale Chard Treating Christin Fudge Referring Physician: Glendale Chard Physician/Extender: Weeks in Treatment: 12  Procedure Performed for: Wound #1 Left,Lateral Lower Leg Performed By: Physician Christin Fudge, MD Post Procedure Diagnosis Same as Pre-procedure Notes after vigorously scrubbing some of the debris of the wound I used some silver nitrate cauterization for appointment which were bleeding. Electronic Signature(s) Signed: 04/25/2016 11:30:30 AM By: Christin Fudge MD, FACS Entered By: Christin Fudge on 04/25/2016 11:30:30 [name] ([id number]) -------------------------------------------------------------------------------- Physical Exam Details Patient Name: [name] 04/25/2016 10:45 Date of Service: AM Medical Record [id number] Number: Patient Account Number: [id number] [birth year] (75 y.o. Treating RN: Montey Hora Date of Birth/Sex: Female) Other Clinician: Primary Care Physician: Glendale Chard Treating [place] Referring Physician: Glendale Chard Physician/Extender: Weeks in Treatment: 12 Constitutional . Pulse regular. Respirations normal and unlabored. Afebrile. . Eyes Nonicteric. Reactive to light. Ears, Nose, Mouth, and Throat Lips, teeth, and gums WNL.[name] Moist mucosa without lesions. Neck supple and nontender. No palpable supraclavicular or cervical adenopathy. Normal sized without goiter. Respiratory WNL. No retractions.. Breath sounds WNL, No rubs, rales, rhonchi, or wheeze.. Cardiovascular Heart rhythm and rate regular, no murmur or Mcdaniel.. Pedal Pulses WNL. No clubbing, cyanosis or edema. Chest Breasts symmetical and no nipple discharge.. Breast tissue WNL, no masses, lumps, or tenderness.. Lymphatic No adneopathy. No adenopathy. No  adenopathy. Musculoskeletal Adexa without tenderness or enlargement.. Digits and nails w/o clubbing, cyanosis, infection, petechiae, ischemia, or inflammatory conditions.. Integumentary (Hair, Skin) No suspicious lesions. No crepitus or fluctuance. No peri-wound warmth or erythema. No masses.[name] Psychiatric Judgement and insight Intact.. No evidence of depression, anxiety, or agitation.. Notes the wound is looking fairly good but has some moist debris at some edges which I have stopped out vigorously with moist saline gauze and minimal bleeding controlled with Silver nitrate stick Electronic Signature(s) Signed: 04/25/2016 11:31:15 AM By: Christin Fudge MD, FACS Entered By: Christin Fudge on 04/25/2016 11:31:15 [name] ([id number]) -------------------------------------------------------------------------------- Physician Orders Details Patient Name: [name] 04/25/2016 10:45 Date of Service: AM Medical Record [id number] Number: Patient Account Number: [id number] [birth year] (75 y.o. Treating RN: Montey Hora Date of Birth/Sex: Female) Other Clinician: Primary Care Physician: Glendale Chard Treating Allison Silva Referring Physician: Glendale Chard Physician/Extender: Suella Grove in Treatment: 12 Verbal / Phone Orders: Yes Clinician: Montey Hora Read Back and Verified: Yes Diagnosis Coding Wound Cleansing Wound #1 Left,Lateral Lower Leg o Clean wound with Normal Saline. o May Shower, gently pat wound dry prior to applying new dressing. Anesthetic Wound #1 Left,Lateral Lower Leg o Topical Lidocaine 4% cream applied to wound bed prior to debridement Skin Barriers/Peri-Wound Care Wound #1 Left,Lateral Lower Leg o Skin Prep Primary Wound Dressing Wound #1 Left,Lateral Lower Leg o Other: - polymem with silver - HHRN please try to get this product for patient to use Secondary Dressing Wound #1 Left,Lateral Lower Leg o ABD pad - DO NOT PUT BORDERED  FOAM ADHESIVE BANDAGE UNDER WRAP o Dry Gauze Dressing Change Frequency Wound #1 Left,Lateral Lower Leg o Other: - twice weekly - once by [facility] and once at [place] Follow-up Appointments Wound #1 Left,Lateral Lower Leg o Return Appointment in 1 week. Edema Control Wound #1 Left,Lateral Lower Leg Mcauliffe, Citlali E. ([id number]) o 3 Layer Compression System - Left Lower Extremity - PLEASE USE 3 LAYER COMPRESSION AND NOT [place] Wound #1 Left,Lateral Lower Leg o [place]  Visits - [place] Nurse may visit PRN to address patientos wound care needs. o FACE TO FACE ENCOUNTER: MEDICARE and MEDICAID PATIENTS: I certify that this patient is under my care  and that I had a face-to-face encounter that meets the physician face-to-face encounter requirements with this patient on this date. The encounter with the patient was in whole or in part for the following MEDICAL CONDITION: (primary reason for [place]) MEDICAL NECESSITY: I certify, that based on my findings, NURSING services are a medically necessary home health service. HOME BOUND STATUS: I certify that my clinical findings support that this patient is homebound (i.e., Due to illness or injury, pt requires aid of supportive devices such as crutches, cane, wheelchairs, walkers, the use of special transportation or the assistance of another person to leave their place of residence. There is a normal inability to leave the home and doing so requires considerable and taxing effort. Other absences are for medical reasons / religious services and are infrequent or of short duration when for other reasons). o If current dressing causes regression in wound condition, may D/C ordered dressing product/s and apply Normal Saline Moist Dressing daily until next [place] / Other MD appointment. [place] of regression in wound condition at  [phone]. o Please direct any NON-WOUND related issues/requests for orders to patient's Primary Care Physician Electronic Signature(s) Signed: 04/25/2016 3:38:57 PM By: Montey Hora Signed: 04/25/2016 3:45:07 PM By: Christin Fudge MD, FACS Entered By: Montey Hora on 04/25/2016 11:16:25 [name] ([id number]) -------------------------------------------------------------------------------- Problem List Details Patient Name: AMITA, ATAYDE 04/25/2016 10:45 Date of Service: AM Medical Record [id number] Number: Patient Account Number: [id number] 06-08-1940 (75 y.o. Treating RN: Montey Hora Date of Birth/Sex: Female) Other Clinician: Primary Care Physician: Glendale Chard Treating Christin Fudge Referring Physician: Glendale Chard Physician/Extender: Weeks in Treatment: 12 Active Problems ICD-10 Encounter Code Description Active Date Diagnosis E11.622 Type 2 diabetes mellitus with other skin ulcer 02/01/2016 Yes L97.222 Non-pressure chronic ulcer of left calf with fat layer 02/01/2016 Yes exposed I89.0 Lymphedema, not elsewhere classified 02/01/2016 Yes Inactive Problems Resolved Problems Electronic Signature(s) Signed: 04/25/2016 11:30:00 AM By: Christin Fudge MD, FACS Entered By: Christin Fudge on 04/25/2016 11:30:00 [name] ([id number]) -------------------------------------------------------------------------------- Progress Note Details Patient Name: [name] 04/25/2016 10:45 Date of Service: AM Medical Record [id number] Number: Patient Account Number: [id number] 20-Dec-1940 (75 y.o. Treating RN: Montey Hora Date of Birth/Sex: Female) Other Clinician: Primary Care Physician: Glendale Chard Treating Bryla Burek Referring Physician: Glendale Chard Physician/Extender: Suella Grove in Treatment: 12 Subjective Chief Complaint Information obtained from Patient Ms. Schulte presents for evaluation of her left bilateral leg ulcer History  of Present Illness (HPI) The following HPI elements were documented for the patient's wound: Location: left lateral lower extremity Quality: denies pain Severity: denies pain Duration: ulcer has been present for several months Timing: denies pain Context: traumatic injury 75 year old patient with a past medical history of diabetes mellitus, chronic kidney disease, COPD, hypertension, rheumatoid arthritis had a lacerated wound to her left calf when she injured it on the corner of a car door and it was a long 12 cm laceration. x-ray done at the time revealed that there was no fracture or radiopaque foreign body. the wound was sutured with 4-0 Prolene simple interrupted sutures and a total of 14 sutures were applied. on August 31 she was seen in the ER and it was noted that the wound had dehisced and she was treated with Keflex. She has completed a course of doxycycline recently Medical history also significant for status post abdominal hysterectomy, brain surgery, cardiac catheterization, EGD and bronchoscopy. of late she was back in the ER and was seen last on  August 31 and they tried Silvadene 02/22/16: pt reports discomfort and mild pain just below her wound. however, she admits on legs more than usual. no redness, foul odor or purulence. the wound is measuring slightly larger today, but likely secondary to poor edema control. nursing staff gave information to pt today regarding purchase of her compression stockings. she was encouraged to purchase soon. 02/29/16: returns today for f/u. she denies systemic s/s of infection. she brought her new compression stockings today. 03/21/2016 -- She has been put on an antibiotic and prednisone for a bronchitis by her pulmonologist. 04/04/2016 -- she continues to be on prednisone. 04-18-2016 Ms. Hotard is for evaluation of her left lateral leg ulcer. She states that she is still on a titrating dose of prednisone. She also states that she recently  received a steroid injection to her left knee. She states that her blood sugars have elevated slightly with the max reading in the 130s. She denies any pain or Farinas, Brittnay E. ([id number]) discomfort to her left leg ulcer. She states that she has been unable to wear her compression stockings and as a result has ankle increased edema to the left leg. Objective Constitutional Pulse regular. Respirations normal and unlabored. Afebrile. Vitals Time Taken: 10:54 AM, Height: 64 in, Weight: 283 lbs, BMI: 48.6, Temperature: 98.4 F, Pulse: 83 bpm, Respiratory Rate: 20 breaths/min, Blood Pressure: 123/69 mmHg. Eyes Nonicteric. Reactive to light. Ears, Nose, Mouth, and Throat Lips, teeth, and gums WNL.[name] Moist mucosa without lesions. Neck supple and nontender. No palpable supraclavicular or cervical adenopathy. Normal sized without goiter. Respiratory WNL. No retractions.. Breath sounds WNL, No rubs, rales, rhonchi, or wheeze.. Cardiovascular Heart rhythm and rate regular, no murmur or Mcdaniel.. Pedal Pulses WNL. No clubbing, cyanosis or edema. Chest Breasts symmetical and no nipple discharge.. Breast tissue WNL, no masses, lumps, or tenderness.. Lymphatic No adneopathy. No adenopathy. No adenopathy. Musculoskeletal Adexa without tenderness or enlargement.. Digits and nails w/o clubbing, cyanosis, infection, petechiae, ischemia, or inflammatory conditions.[name] Psychiatric Judgement and insight Intact.. No evidence of depression, anxiety, or agitation.. General Notes: the wound is looking fairly good but has some moist debris at some edges which I have stopped out vigorously with moist saline gauze and minimal bleeding controlled with Silver nitrate stick Integumentary (Hair, Skin) Copado, Cicley E. ([id number]) No suspicious lesions. No crepitus or fluctuance. No peri-wound warmth or erythema. No masses.. Wound #1 status is Open. Original cause of wound was Trauma. The wound is located on the  Left,Lateral Lower Leg. The wound measures 2.4cm length x 2cm width x 0.1cm depth; 3.77cm^2 area and 0.377cm^3 volume. The wound is limited to skin breakdown. There is no tunneling or undermining noted. There is a medium amount of sanguinous drainage noted. The wound margin is distinct with the outline attached to the wound base. There is large (67-100%) red granulation within the wound bed. There is a small (1-33%) amount of necrotic tissue within the wound bed including Adherent Slough. The periwound skin appearance exhibited: Localized Edema, Scarring, Moist, Ecchymosis, Hemosiderin Staining. The periwound skin appearance did not exhibit: Callus, Crepitus, Excoriation, Fluctuance, Friable, Induration, Rash, Dry/Scaly, Maceration, Atrophie Blanche, Cyanosis, Mottled, Pallor, Rubor, Erythema. Periwound temperature was noted as No Abnormality. The periwound has tenderness on palpation. Assessment Active Problems ICD-10 E11.622 - Type 2 diabetes mellitus with other skin ulcer L97.222 - Non-pressure chronic ulcer of left calf with fat layer exposed I89.0 - Lymphedema, not elsewhere classified Procedures Wound #1 Wound #1 is a Diabetic Wound/Ulcer of the Lower Extremity  located on the Left,Lateral Lower Leg . An CHEM CAUT GRANULATION TISS procedure was performed by Christin Fudge, MD. Post procedure Diagnosis Wound #1: Same as Pre-Procedure Notes: after vigorously scrubbing some of the debris of the wound I used some silver nitrate cauterization for appointment which were bleeding. Plan Wound Cleansing: Wound #1 Left,Lateral Lower Leg: Clean wound with Normal Saline. May Shower, gently pat wound dry prior to applying new dressing. JENNIGER, FIGIEL ([id number]) Anesthetic: Wound #1 Left,Lateral Lower Leg: Topical Lidocaine 4% cream applied to wound bed prior to debridement Skin Barriers/Peri-Wound Care: Wound #1 Left,Lateral Lower Leg: Skin Prep Primary Wound Dressing: Wound #1  Left,Lateral Lower Leg: Other: - polymem with silver - HHRN please try to get this product for patient to use Secondary Dressing: Wound #1 Left,Lateral Lower Leg: ABD pad - DO NOT PUT BORDERED FOAM ADHESIVE BANDAGE UNDER WRAP Dry Gauze Dressing Change Frequency: Wound #1 Left,Lateral Lower Leg: Other: - twice weekly - once by [facility] and once at [place] Follow-up Appointments: Wound #1 Left,Lateral Lower Leg: Return Appointment in 1 week. Edema Control: Wound #1 Left,Lateral Lower Leg: 3 Layer Compression System - Left Lower Extremity - PLEASE USE 3 LAYER COMPRESSION AND NOT [place]: Wound #1 Left,Lateral Lower Leg: Continue Home Health Visits - [facility] Nurse may visit PRN to address patient s wound care needs. FACE TO FACE ENCOUNTER: MEDICARE and MEDICAID PATIENTS: I certify that this patient is under my care and that I had a face-to-face encounter that meets the physician face-to-face encounter requirements with this patient on this date. The encounter with the patient was in whole or in part for the following MEDICAL CONDITION: (primary reason for [place]) MEDICAL NECESSITY: I certify, that based on my findings, NURSING services are a medically necessary home health service. HOME BOUND STATUS: I certify that my clinical findings support that this patient is homebound (i.e., Due to illness or injury, pt requires aid of supportive devices such as crutches, cane, wheelchairs, walkers, the use of special transportation or the assistance of another person to leave their place of residence. There is a normal inability to leave the home and doing so requires considerable and taxing effort. Other absences are for medical reasons / religious services and are infrequent or of short duration when for other reasons). If current dressing causes regression in wound condition, may D/C ordered dressing product/s and apply Normal Saline Moist Dressing  daily until next [place] / Other MD appointment. [place] of regression in wound condition at [phone]. Please direct any NON-WOUND related issues/requests for orders to patient's Primary Care Physician I have recommended we continue with PolyMem silver foam and Profore lites to be changed twice a week which should be adequate for her. AMBRIELLE, KINGTON ([id number]) Elevation and exercise have been discussed with her in detail Electronic Signature(s) Signed: 04/25/2016 11:32:00 AM By: Christin Fudge MD, FACS Entered By: Christin Fudge on 04/25/2016 11:32:00 [name] ([id number]) -------------------------------------------------------------------------------- SuperBill Details Patient Name: [name]. Date of Service: 04/25/2016 Medical Record Number: [id number] Patient Account Number: [id number] Date of Birth/Sex: 12-Sep-1940 (75 y.o. Female) Treating RN: Montey Hora Primary Care Physician: Glendale Chard Other Clinician: Referring Physician: Glendale Chard Treating Physician/Extender: Frann Rider in Treatment: 12 Diagnosis Coding ICD-10 Codes Code Description E11.622 Type 2 diabetes mellitus with other skin ulcer L97.222 Non-pressure chronic ulcer of left calf with fat layer exposed I89.0 Lymphedema, not elsewhere classified Facility Procedures CPT4 Code: [id number]  Description: 46286 - CHEM CAUT GRANULATION TISS ICD-10 Description Diagnosis E11.622 Type 2 diabetes mellitus with other skin ulcer L97.222 Non-pressure chronic ulcer of left calf with fat l I89.0 Lymphedema, not elsewhere classified Modifier: ayer exposed Quantity: 1 Physician Procedures CPT4 Code: [id number] Description: 65790 - WC PHYS CHEM CAUT GRAN TISSUE ICD-10 Description Diagnosis E11.622 Type 2 diabetes mellitus with other skin ulcer L97.222 Non-pressure chronic ulcer of left calf with fat la I89.0 Lymphedema, not elsewhere classified Modifier: yer  exposed Quantity: 1 Electronic Signature(s) Signed: 04/25/2016 11:32:14 AM By: Christin Fudge MD, FACS Entered By: Christin Fudge on 04/25/2016 11:32:13

## 2016-04-26 NOTE — Progress Notes (Signed)
BAILLIE, MOHAMMAD ([id number]) Visit Report for 04/25/2016 Arrival Information Details Patient Name: [name], [name]. Date of Service: 04/25/2016 10:45 AM Medical Record Number: [id number] Patient Account Number: [id number] Date of Birth/Sex: 13-Aug-1940 (75 y.o. Female) Treating RN: Montey Hora Primary Care Physician: Glendale Chard Other Clinician: Referring Physician: Glendale Chard Treating Physician/Extender: Frann Rider in Treatment: 12 Visit Information History Since Last Visit Added or deleted any medications: No Patient Arrived: Walker Any new allergies or adverse reactions: No Arrival Time: 10:51 Had a fall or experienced change in No Accompanied By: self activities of daily living that may affect Transfer Assistance: None risk of falls: Patient Identification Verified: Yes Signs or symptoms of abuse/neglect since last No Secondary Verification Process Completed: Yes visito Patient Has Alerts: Yes Hospitalized since last visit: No Patient Alerts: DM II Pain Present Now: No Electronic Signature(s) Signed: 04/25/2016 3:38:57 PM By: Montey Hora Entered By: Montey Hora on 04/25/2016 10:53:32 [name] ([id number]) -------------------------------------------------------------------------------- Encounter Discharge Information Details Patient Name: [name]. Date of Service: 04/25/2016 10:45 AM Medical Record Number: [id number] Patient Account Number: [id number] Date of Birth/Sex: September 03, 1940 (75 y.o. Female) Treating RN: Montey Hora Primary Care Physician: Glendale Chard Other Clinician: Referring Physician: Glendale Chard Treating Physician/Extender: Frann Rider in Treatment: 12 Encounter Discharge Information Items Discharge Pain Level: 0 Discharge Condition: Stable Ambulatory Status: Walker Discharge Destination: Home Transportation: Private Auto Accompanied By: self Schedule Follow-up Appointment: Yes Medication  Reconciliation completed No and provided to Patient/Care Alyaan Budzynski: Provided on Clinical Summary of Care: 04/25/2016 Form Type Recipient Paper Patient DG Electronic Signature(s) Signed: 04/25/2016 11:30:37 AM By: Ruthine Dose Entered By: Ruthine Dose on 04/25/2016 11:30:37 [name] ([id number]) -------------------------------------------------------------------------------- Lower Extremity Assessment Details Patient Name: [name]. Date of Service: 04/25/2016 10:45 AM Medical Record Number: [id number] Patient Account Number: [id number] Date of Birth/Sex: 1941-04-19 (75 y.o. Female) Treating RN: Montey Hora Primary Care Physician: Glendale Chard Other Clinician: Referring Physician: Glendale Chard Treating Physician/Extender: Frann Rider in Treatment: 12 Edema Assessment Assessed: [Left: No] [Right: No] Edema: [Left: Ye] [Right: s] Calf Left: Right: Point of Measurement: 34 cm From Medial Instep 48 cm cm Ankle Left: Right: Point of Measurement: 11 cm From Medial Instep 30.4 cm cm Vascular Assessment Pulses: Posterior Tibial Dorsalis Pedis Palpable: [Left:Yes] Extremity colors, hair growth, and conditions: Extremity Color: [Left:Hyperpigmented] Hair Growth on Extremity: [Left:No] Temperature of Extremity: [Left:Warm] Capillary Refill: [Left:< 3 seconds] Electronic Signature(s) Signed: 04/25/2016 3:38:57 PM By: Montey Hora Entered By: Montey Hora on 04/25/2016 11:02:19 [name] ([id number]) -------------------------------------------------------------------------------- Multi Wound Chart Details Patient Name: [name]. Date of Service: 04/25/2016 10:45 AM Medical Record Number: [id number] Patient Account Number: [id number] Date of Birth/Sex: [birth year] (75 y.o. 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 ([id number]) Wound Margin: Distinct, outline attached N/A N/A Granulation Amount: Large (67-100%) N/A N/A Granulation Quality: Red N/A N/A Necrotic Amount: Small (1-33%) N/A N/A Exposed Structures: Fascia: No N/A N/A Fat: No Tendon: No Muscle: No Joint: No Bone: No Limited to Skin Breakdown Epithelialization: Medium (34-66%) N/A N/A Periwound Skin Texture: Edema: Yes N/A N/A Scarring: Yes Excoriation: No Induration: No Callus: No Crepitus: No Fluctuance: No Friable: No Rash: No Periwound Skin Moist: Yes N/A N/A Moisture: Maceration: No Dry/Scaly: No Periwound Skin Color: Ecchymosis: Yes N/A N/A Hemosiderin Staining: Yes Atrophie Blanche: No Cyanosis: No Erythema: No Mottled: No Pallor: No Rubor: No Temperature: No Abnormality N/A N/A Tenderness on Yes N/A N/A Palpation: Wound Preparation: Ulcer Cleansing: N/A N/A Rinsed/Irrigated with Saline Topical Anesthetic Applied: Other:  lidocaine 4% Treatment Notes Electronic Signature(s) Signed: 04/25/2016 3:38:57 PM By: Montey Hora Entered By: Montey Hora on 04/25/2016 11:10:36 [name], [name] ([id number]) [name] ([id number]) -------------------------------------------------------------------------------- [place] Details Patient Name: [name], [name]. Date of Service: 04/25/2016 10:45 AM Medical Record Number: [id number] Patient Account Number: [id number] Date of Birth/Sex: 09-Mar-1941 (75 y.o. Female) Treating RN: Montey Hora Primary Care Physician: Glendale Chard Other Clinician: Referring Physician: Glendale Chard Treating Physician/Extender: Frann Rider in Treatment: 12 Active Inactive Abuse / Safety / Falls / Self Care Management Nursing Diagnoses: Impaired physical mobility Potential for falls Goals: Patient will remain injury free Date Initiated: 02/01/2016 Goal Status: Active Interventions: Assess fall risk on admission and as needed Notes: Orientation to the Wound Care Program Nursing Diagnoses: Knowledge deficit related to the wound healing center program Goals: Patient/caregiver will verbalize understanding of the [place] Program Date Initiated: 02/01/2016 Goal Status: Active Interventions: Provide education on orientation to the wound center Notes: Wound/Skin Impairment Nursing Diagnoses: Impaired tissue integrity Goals: Patient/caregiver will verbalize understanding of skin care regimen [name], [name] ([id number]) Date Initiated: 02/01/2016 Goal Status: Active Ulcer/skin breakdown will have a volume reduction of 30% by week 4 Date Initiated: 02/01/2016 Goal Status: Active Ulcer/skin breakdown will have a volume reduction of 50% by week 8 Date Initiated: 02/01/2016 Goal Status: Active Ulcer/skin breakdown will have a volume reduction of 80% by week 12 Date Initiated: 02/01/2016 Goal Status: Active Ulcer/skin  breakdown will heal within 14 weeks Date Initiated: 02/01/2016 Goal Status: Active Interventions: Assess patient/caregiver ability to obtain necessary supplies Assess patient/caregiver ability to perform ulcer/skin care regimen upon admission and as needed Assess ulceration(s) every visit Notes: Electronic Signature(s) Signed: 04/25/2016 3:38:57 PM By: Montey Hora Entered By: Montey Hora on 04/25/2016 11:10:21 [name] ([id number]) -------------------------------------------------------------------------------- Pain Assessment Details Patient Name: [name]. Date of Service: 04/25/2016 10:45 AM Medical Record Number: [id number] Patient Account Number: [id number] Date of Birth/Sex: 1941-03-18 (75 y.o. Female) Treating RN: Montey Hora Primary Care Physician: Glendale Chard Other Clinician: Referring Physician: Glendale Chard Treating Physician/Extender: Frann Rider in Treatment: 12 Active Problems Location of Pain Severity and Description of Pain Patient Has Paino No Site Locations Pain Management and Medication Current Pain Management: Notes Topical or injectable lidocaine is offered to patient for acute pain when surgical debridement is performed. If needed, Patient is instructed to use over the counter pain medication for the following 24-48 hours after debridement. Wound care MDs do not prescribed pain medications. Patient has chronic pain or uncontrolled pain. Patient has  been instructed to make an appointment with their Primary Care Physician for pain management. Electronic Signature(s) Signed: 04/25/2016 3:38:57 PM By: Montey Hora Entered By: Montey Hora on 04/25/2016 10:53:39 [name] ([id number]) -------------------------------------------------------------------------------- Patient/Caregiver Education Details Patient Name: [name] Date of Service: 04/25/2016 10:45 AM Medical Record Number:  [id number] Patient Account Number: [id number] Date of Birth/Gender: 09-14-1940 (75 y.o. Female) Treating RN: Montey Hora Primary Care Physician: Glendale Chard Other Clinician: Referring Physician: Glendale Chard Treating Physician/Extender: Frann Rider in Treatment: 12 Education Assessment Education Provided To: Patient Education Topics Provided Venous: Handouts: Other: leg elevation Methods: Explain/Verbal Responses: State content correctly Electronic Signature(s) Signed: 04/25/2016 3:38:57 PM By: Montey Hora Entered By: Montey Hora on 04/25/2016 11:12:54 [name] ([id number]) -------------------------------------------------------------------------------- Wound Assessment Details Patient Name: [name]. Date of Service: 04/25/2016 10:45 AM Medical Record Number: [id number] Patient Account Number: [id number] Date of Birth/Sex: 1941/02/06 (75 y.o. Female) Treating RN: Montey Hora Primary Care Physician: Glendale Chard Other Clinician: Referring Physician: Glendale Chard Treating Physician/Extender: Frann Rider in Treatment: 12 Wound Status Wound Number: 1 Primary Diabetic Wound/Ulcer of the Lower Etiology: Extremity Wound Location: Left Lower Leg - Lateral Secondary Auto-immune Wounding Event: Trauma Etiology: Date Acquired: 12/27/2015 Wound Open Weeks Of Treatment: 12 Status: Clustered Wound: No Comorbid Cataracts, Lymphedema, History: Hypertension, Type II Diabetes, Gout, Rheumatoid Arthritis, Osteoarthritis, Neuropathy Photos Wound Measurements Length: (cm) 2.4 Width: (cm) 2 Depth: (cm) 0.1 Area: (cm) 3.77 Volume: (cm) 0.377 % Reduction in Area: 81% % Reduction in Volume: 90.5% Epithelialization: Medium (34-66%) Tunneling: No Undermining: No Wound Description Classification: Grade 2 Wound Margin: Distinct, outline attached Exudate Amount: Medium Exudate Type: Sanguinous Exudate Color: red Wound  Bed Granulation Amount: Large (67-100%) Exposed Structure Granulation Quality: Red Fascia Exposed: No Nilsen, Treacy E. ([id number]) Necrotic Amount: Small (1-33%) Fat Layer Exposed: No Necrotic Quality: Adherent Slough Tendon Exposed: No Muscle Exposed: No Joint Exposed: No Bone Exposed: No Limited to Skin Breakdown Periwound Skin Texture Texture Color No Abnormalities Noted: No No Abnormalities Noted: No Callus: No Atrophie Blanche: No Crepitus: No Cyanosis: No Excoriation: No Ecchymosis: Yes Fluctuance: No Erythema: No Friable: No Hemosiderin Staining: Yes Induration: No Mottled: No Localized Edema: Yes Pallor: No Rash: No Rubor: No Scarring: Yes Temperature / Pain Moisture Temperature: No Abnormality No Abnormalities Noted: No Tenderness on Palpation: Yes Dry / Scaly: No Maceration: No Moist: Yes Wound Preparation Ulcer Cleansing: Rinsed/Irrigated with Saline Topical Anesthetic Applied: Other: lidocaine 4%, Treatment Notes Wound #1 (Left, Lateral Lower Leg) 1. Cleansed with: Cleanse wound with antibacterial soap and water 4. Dressing Applied: Other dressing (specify in notes) 5. Secondary Dressing Applied ABD Pad 7. Secured with 3 Layer Compression System - Left Lower Extremity Notes polymem with silver Electronic Signature(s) Signed: 04/25/2016 3:38:57 PM By: Montey Hora Entered By: Montey Hora on 04/25/2016 11:03:54 [name] ([id number]) [name], [name] ([id number]) -------------------------------------------------------------------------------- Vitals Details Patient Name: [name]. Date of Service: 04/25/2016 10:45 AM Medical Record Number: [id number] Patient Account Number: [id number] Date of Birth/Sex: [birth year] (75 y.o. Female) Treating RN: Montey Hora Primary Care Physician: Glendale Chard Other Clinician: Referring Physician: Glendale Chard Treating Physician/Extender: Frann Rider in Treatment:  12 Vital Signs Time Taken: 10:54 Temperature (F): 98.4 Height (in): 64 Pulse (bpm): 83 Weight (lbs): 283 Respiratory Rate (breaths/min): 20 Body Mass Index (BMI): 48.6 Blood Pressure (mmHg): 123/69 Reference Range: 80 - 120 mg / dl Electronic Signature(s) Signed: 04/25/2016 3:38:57 PM By: Montey Hora Entered By: Montey Hora on 04/25/2016 10:55:32

## 2016-04-28 DIAGNOSIS — S81812D Laceration without foreign body, left lower leg, subsequent encounter: Secondary | ICD-10-CM | POA: Diagnosis not present

## 2016-04-28 DIAGNOSIS — T798XXD Other early complications of trauma, subsequent encounter: Secondary | ICD-10-CM | POA: Diagnosis not present

## 2016-04-28 DIAGNOSIS — N182 Chronic kidney disease, stage 2 (mild): Secondary | ICD-10-CM | POA: Diagnosis not present

## 2016-04-28 DIAGNOSIS — I130 Hypertensive heart and chronic kidney disease with heart failure and stage 1 through stage 4 chronic kidney disease, or unspecified chronic kidney disease: Secondary | ICD-10-CM | POA: Diagnosis not present

## 2016-04-28 DIAGNOSIS — E1122 Type 2 diabetes mellitus with diabetic chronic kidney disease: Secondary | ICD-10-CM | POA: Diagnosis not present

## 2016-04-28 DIAGNOSIS — I5032 Chronic diastolic (congestive) heart failure: Secondary | ICD-10-CM | POA: Diagnosis not present

## 2016-04-29 ENCOUNTER — Encounter: Payer: Self-pay | Admitting: Physical Medicine & Rehabilitation

## 2016-04-29 ENCOUNTER — Encounter: Payer: Medicare Other | Attending: Physical Medicine & Rehabilitation | Admitting: Physical Medicine & Rehabilitation

## 2016-04-29 DIAGNOSIS — Z808 Family history of malignant neoplasm of other organs or systems: Secondary | ICD-10-CM | POA: Insufficient documentation

## 2016-04-29 DIAGNOSIS — M48062 Spinal stenosis, lumbar region with neurogenic claudication: Secondary | ICD-10-CM | POA: Diagnosis not present

## 2016-04-29 DIAGNOSIS — M069 Rheumatoid arthritis, unspecified: Secondary | ICD-10-CM | POA: Insufficient documentation

## 2016-04-29 DIAGNOSIS — M1712 Unilateral primary osteoarthritis, left knee: Secondary | ICD-10-CM | POA: Diagnosis not present

## 2016-04-29 DIAGNOSIS — R200 Anesthesia of skin: Secondary | ICD-10-CM | POA: Diagnosis not present

## 2016-04-29 DIAGNOSIS — J449 Chronic obstructive pulmonary disease, unspecified: Secondary | ICD-10-CM | POA: Diagnosis not present

## 2016-04-29 DIAGNOSIS — N182 Chronic kidney disease, stage 2 (mild): Secondary | ICD-10-CM | POA: Diagnosis not present

## 2016-04-29 DIAGNOSIS — M1288 Other specific arthropathies, not elsewhere classified, other specified site: Secondary | ICD-10-CM

## 2016-04-29 DIAGNOSIS — M545 Low back pain: Secondary | ICD-10-CM | POA: Diagnosis not present

## 2016-04-29 DIAGNOSIS — E1142 Type 2 diabetes mellitus with diabetic polyneuropathy: Secondary | ICD-10-CM | POA: Insufficient documentation

## 2016-04-29 DIAGNOSIS — Z8051 Family history of malignant neoplasm of kidney: Secondary | ICD-10-CM | POA: Diagnosis not present

## 2016-04-29 DIAGNOSIS — E079 Disorder of thyroid, unspecified: Secondary | ICD-10-CM | POA: Insufficient documentation

## 2016-04-29 DIAGNOSIS — T798XXD Other early complications of trauma, subsequent encounter: Secondary | ICD-10-CM | POA: Diagnosis not present

## 2016-04-29 DIAGNOSIS — E1122 Type 2 diabetes mellitus with diabetic chronic kidney disease: Secondary | ICD-10-CM | POA: Diagnosis not present

## 2016-04-29 DIAGNOSIS — J8410 Pulmonary fibrosis, unspecified: Secondary | ICD-10-CM | POA: Insufficient documentation

## 2016-04-29 DIAGNOSIS — Z803 Family history of malignant neoplasm of breast: Secondary | ICD-10-CM | POA: Insufficient documentation

## 2016-04-29 DIAGNOSIS — E7800 Pure hypercholesterolemia, unspecified: Secondary | ICD-10-CM | POA: Insufficient documentation

## 2016-04-29 DIAGNOSIS — I130 Hypertensive heart and chronic kidney disease with heart failure and stage 1 through stage 4 chronic kidney disease, or unspecified chronic kidney disease: Secondary | ICD-10-CM | POA: Diagnosis not present

## 2016-04-29 DIAGNOSIS — I5032 Chronic diastolic (congestive) heart failure: Secondary | ICD-10-CM | POA: Diagnosis not present

## 2016-04-29 DIAGNOSIS — M62838 Other muscle spasm: Secondary | ICD-10-CM | POA: Insufficient documentation

## 2016-04-29 DIAGNOSIS — S81812D Laceration without foreign body, left lower leg, subsequent encounter: Secondary | ICD-10-CM | POA: Diagnosis not present

## 2016-04-29 DIAGNOSIS — Z9889 Other specified postprocedural states: Secondary | ICD-10-CM | POA: Insufficient documentation

## 2016-04-29 DIAGNOSIS — D689 Coagulation defect, unspecified: Secondary | ICD-10-CM | POA: Diagnosis not present

## 2016-04-29 DIAGNOSIS — Z833 Family history of diabetes mellitus: Secondary | ICD-10-CM | POA: Insufficient documentation

## 2016-04-29 DIAGNOSIS — N189 Chronic kidney disease, unspecified: Secondary | ICD-10-CM | POA: Insufficient documentation

## 2016-04-29 DIAGNOSIS — Z8249 Family history of ischemic heart disease and other diseases of the circulatory system: Secondary | ICD-10-CM | POA: Diagnosis not present

## 2016-04-29 DIAGNOSIS — M7062 Trochanteric bursitis, left hip: Secondary | ICD-10-CM | POA: Diagnosis not present

## 2016-04-29 DIAGNOSIS — M1711 Unilateral primary osteoarthritis, right knee: Secondary | ICD-10-CM

## 2016-04-29 DIAGNOSIS — F329 Major depressive disorder, single episode, unspecified: Secondary | ICD-10-CM | POA: Diagnosis not present

## 2016-04-29 DIAGNOSIS — M47816 Spondylosis without myelopathy or radiculopathy, lumbar region: Secondary | ICD-10-CM

## 2016-04-29 DIAGNOSIS — I129 Hypertensive chronic kidney disease with stage 1 through stage 4 chronic kidney disease, or unspecified chronic kidney disease: Secondary | ICD-10-CM | POA: Diagnosis not present

## 2016-04-29 DIAGNOSIS — Z801 Family history of malignant neoplasm of trachea, bronchus and lung: Secondary | ICD-10-CM | POA: Diagnosis not present

## 2016-04-29 DIAGNOSIS — M170 Bilateral primary osteoarthritis of knee: Secondary | ICD-10-CM | POA: Insufficient documentation

## 2016-04-29 MED ORDER — MORPHINE SULFATE 15 MG PO TABS: 15.0000 mg | ORAL_TABLET | Freq: Three times a day (TID) | ORAL | 0 refills | Status: DC | PRN

## 2016-04-29 MED ORDER — MORPHINE SULFATE ER 15 MG PO TBCR: 15.0000 mg | EXTENDED_RELEASE_TABLET | Freq: Two times a day (BID) | ORAL | 0 refills | Status: DC

## 2016-04-29 NOTE — Progress Notes (Signed)
Subjective:    Patient ID: [name], female    DOB: [birth year], 75 y.o.   MRN: [id number]  HPI   [name] is here in follow up of her pain. She reports that her knee pain has increased over the last few months. She continues to struggle with the wound on her left lower extremity. She is followed weekly t the wound care clinic currently. She has had a compressive dressing on the leg for edema and wound rx. THe left knee feels that it's going to give out when she's up on it for longer period of time. She reports pain along the joint as well as more dstally along the patella and proximal lower leg. The right knee doesn't hurt as much but she's noticed more "turning in" of the knee when she stands.   She also reports some tightness and spasms in her left low back. She received a muscle relaxant recently which provided some relief.  Pain Inventory Average Pain 6 Pain Right Now 5 My pain is sharp and aching  In the last 24 hours, has pain interfered with the following? General activity 7 Relation with others 5 Enjoyment of life 5 What TIME of day is your pain at its worst? morning Sleep (in general) Good  Pain is worse with: walking, bending, standing and some activites Pain improves with: rest, heat/ice and medication Relief from Meds: 6  Mobility use a cane use a walker ability to climb steps?  no use a wheelchair  Function retired  Neuro/Psych bladder control problems numbness tingling spasms  Prior Studies Any changes since last visit?  no  Physicians involved in your care Any changes since last visit?  no   Family History  Problem Relation Age of Onset  . Diabetes Mother   . Heart attack Mother   . Hypertension Father   . Lung cancer Father   . Diabetes Sister   . Diabetes Brother   . Hypertension Brother   . Heart disease Brother   . Heart attack Brother   . Kidney cancer Brother   . Uterine cancer Daughter   . Breast cancer Sister   . Rheum  arthritis Maternal Uncle   . Gout Brother   . Kidney failure Brother     x 5   Social History   Social History  . Marital status: Married    Spouse name: N/A  . Number of children: 6  . Years of education: college   Occupational History  . retired Marine scientist    Social History Main Topics  . Smoking status: Never Smoker  . Smokeless tobacco: Never Used  . Alcohol use No  . Drug use: No  . Sexual activity: Not Currently   Other Topics Concern  . None   Social History Narrative   Patient consumes 2-3 cups coffee per day.   Past Surgical History:  Procedure Laterality Date  . ABDOMINAL HYSTERECTOMY    . BRAIN SURGERY     Gamma knife 10/13. Needs repeat spring  '14  . CARDIAC CATHETERIZATION N/A 10/31/2014   Procedure: Right/Left Heart Cath and Coronary Angiography;  Surgeon: [name], MD;  Location: [facility];  Service: Cardiovascular;  Laterality: N/A;  . ESOPHAGOGASTRODUODENOSCOPY (EGD) WITH PROPOFOL N/A 09/14/2014   Procedure: ESOPHAGOGASTRODUODENOSCOPY (EGD) WITH PROPOFOL;  Surgeon: [name], MD;  Location: [facility];  Service: Endoscopy;  Laterality: N/A;  . OVARY SURGERY    . SHOULDER SURGERY Left   . TONSILLECTOMY  age 30  . VIDEO BRONCHOSCOPY Bilateral 05/31/2013   Procedure: VIDEO BRONCHOSCOPY WITHOUT FLUORO;  Surgeon: Brand Males, MD;  Location: [facility];  Service: Cardiopulmonary;  Laterality: Bilateral;  . video bronscoscopy  2000   lung   Past Medical History:  Diagnosis Date  . Arthritis    endstage changes bilateral knees/bilateral ankles.   . Asthma   . Carotid artery occlusion   . Chronic kidney disease   . Clotting disorder ([place])    pt denies this  . Contusion of left knee    due to fall 1/14.  [name] COPD (chronic obstructive pulmonary disease) (HCC)    pulmonary fibrosis  . Depression, reactive   . Diabetes mellitus   . Diastolic dysfunction   . Family history of heart disease   . High cholesterol   .  Hypertension   . Interstitial lung disease ([place])   . 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    . Morbid obesity ([place])   . Normal coronary arteries    cardiac catheterization performed  10/31/14  . RA (rheumatoid arthritis) ([place])    has been off methotreaxte since 10/13.  [name] Shortness of breath   . Thyroid disease   . URI (upper respiratory infection)    BP (!) 103/57   Pulse 89   Resp 14   SpO2 90%   Opioid Risk Score:   Fall Risk Score:  `1  Depression screen PHQ 2/9  Depression screen [facility] 2/9 08/09/2015 01/05/2015 09/20/2014 08/10/2013  Decreased Interest _0 -  Down, Depressed, Hopeless _1 PHQ - 2 Score _2 Altered sleeping - 2 0 -  Tired, decreased energy - 2 3 -  Change in appetite - 2 3 -  Feeling bad or failure about yourself  - 0 3 -  Trouble concentrating - 1 1 -  Moving slowly or fidgety/restless - 0 2 -  Suicidal thoughts - 0 0 -  PHQ-9 Score - 10 16 -  Difficult doing work/chores - Somewhat difficult - -  Some recent data might be hidden     Review of Systems  Constitutional: Negative.   HENT: Negative.   Eyes: Negative.   Respiratory: Positive for shortness of breath.        Respiratory  Infections  Cardiovascular: Negative.   Gastrointestinal: Negative.   Endocrine: Negative.   Genitourinary: Negative.   Musculoskeletal: Negative.   Skin: Negative.   Allergic/Immunologic: Negative.   Neurological: Negative.   Hematological: Negative.   Psychiatric/Behavioral: Negative.   All other systems reviewed and are negative.      Objective:   Physical Exam  General: Alert and oriented x 3, No apparent distress.remains obese  HEENT: Head is normocephalic, atraumatic, PERRLA, EOMI, sclera anicteric, oral mucosa pink and moist, dentition intact, ext ear canals clear,  Neck: Supple without JVD or lymphadenopathy  Heart: RRR Chest: Chest clear  Abdomen:  Soft, non-tender, non-distended, bowel sounds positive.  Extremities: No clubbing, cyanosis, 2+ edema. Pulses are 2+  Skin: wound about 3cm in diameter left shin.  Neuro: Pt is cognitively appropriate with normal insight, memory, and awareness. Cranial nerves 2-12 are intact. Sensory exam is near normal in both legs... Reflexes are 2+ in all 4's. Fine motor coordination is intact. No tremors. Motor function is grossly 4 to 5/5 with pain inhbition.  Musculoskeletal: left knee tender.  She is slightly  elevated in the left pelvis. She is antalgic with gait on the left. Crepitus in both knees with more pain in left knee. Edema 1++ LLE around knee and distally in leg. Full ROM at left knee. Valgus deformity in standing right knee. Gait is generally antalgic. Fingers lack flexion especially at DIP's. No catches or nodules palpated along volar aspects of fingers.  Psych: Pt's affect is appropriate. Pt is cooperative   Assessment & Plan:   Assessment:  1. Lumbar spinal stenosis with neurogenic claudication. Associated facet arthropathy  2. Depression.  3. Diabetes mellitus type 2 with polyneuropathy  4. Rheumatoid arthritis and osteoarthritis. Multiple joint involvement including knees.  5. Left greater troch bursitis  6. Interstitial lung disease  7. Multifactorial fatigue--     Plan:  1. Ordered Hinged neoprene knee brace for support of left knee, May help with swelling also. We can consider a brace for right knee also but it won't change some of the developing deformity there.  2. Voltaren gel for fingers/knees. Consider injection right knee. Had recent steroid injection by [place]   3. MSO4 for breakthrough pain, one q8 prn #90--  -MS Contin 37m CR q12 #60.  4 . Continue with wound care per wound care clinic. 5. Continue cymbalta but decrease to 657mdaily. Consider decreasing gabapentin slighlty also. 6. Follow up with NP in 1 months. 15 minutes of face to face patient care time were  spent during this visit. All questions were encouraged and answered.

## 2016-04-29 NOTE — Patient Instructions (Signed)
PLEASE CALL ME WITH ANY PROBLEMS OR QUESTIONS ([phone])   HAPPY HOLIDAYS!!!!                    *                * *             *   *   *         *  *   *  *  *     *  *  *  *  *  *  * *  *  *  *  *  *  *  *  *  * *               *  *               *  *               *  *

## 2016-05-02 ENCOUNTER — Encounter: Payer: Medicare Other | Admitting: Surgery

## 2016-05-02 DIAGNOSIS — E11622 Type 2 diabetes mellitus with other skin ulcer: Secondary | ICD-10-CM | POA: Diagnosis not present

## 2016-05-02 DIAGNOSIS — J449 Chronic obstructive pulmonary disease, unspecified: Secondary | ICD-10-CM | POA: Diagnosis not present

## 2016-05-02 DIAGNOSIS — N189 Chronic kidney disease, unspecified: Secondary | ICD-10-CM | POA: Diagnosis not present

## 2016-05-02 DIAGNOSIS — L928 Other granulomatous disorders of the skin and subcutaneous tissue: Secondary | ICD-10-CM | POA: Diagnosis not present

## 2016-05-02 DIAGNOSIS — E1122 Type 2 diabetes mellitus with diabetic chronic kidney disease: Secondary | ICD-10-CM | POA: Diagnosis not present

## 2016-05-02 DIAGNOSIS — I890 Lymphedema, not elsewhere classified: Secondary | ICD-10-CM | POA: Diagnosis not present

## 2016-05-02 DIAGNOSIS — L97222 Non-pressure chronic ulcer of left calf with fat layer exposed: Secondary | ICD-10-CM | POA: Diagnosis not present

## 2016-05-04 NOTE — Progress Notes (Signed)
LILEY, RAKE ([id number]) Visit Report for 05/02/2016 Chief Complaint Document Details Patient Name: [name], [name] 05/02/2016 12:45 Date of Service: PM Medical Record [id number] Number: Patient Account Number: [id number] Nov 14, 1940 (75 y.o. Treating RN: Montey Hora Date of Birth/Sex: Female) Other Clinician: Primary Care Physician: Glendale Chard Treating Christin Fudge Referring Physician: Glendale Chard Physician/Extender: Suella Grove in Treatment: 13 Information Obtained from: Patient Chief Complaint Ms. Halperin presents for evaluation of her left bilateral leg ulcer Electronic Signature(s) Signed: 05/02/2016 1:27:19 PM By: Christin Fudge MD, FACS Entered By: Christin Fudge on 05/02/2016 13:27:19 [name] ([id number]) -------------------------------------------------------------------------------- HPI Details Patient Name: [name] 05/02/2016 12:45 Date of Service: PM Medical Record [id number] Number: Patient Account Number: [id number] [birth year] (75 y.o. Treating RN: Montey Hora Date of Birth/Sex: Female) Other Clinician: Primary Care Physician: Glendale Chard Treating Jermar Colter Referring Physician: Glendale Chard Physician/Extender: Weeks in Treatment: 13 History of Present Illness Location: left lateral lower extremity Quality: denies pain Severity: denies pain Duration: ulcer has been present for several months Timing: denies pain Context: traumatic injury HPI Description: 75 year old patient with a past medical history of diabetes mellitus, chronic kidney disease, COPD, hypertension, rheumatoid arthritis had a lacerated wound to her left calf when she injured it on the corner of a car door and it was a long 12 cm laceration. x-ray done at the time revealed that there was no fracture or radiopaque foreign body. the wound was sutured with 4-0 Prolene simple interrupted sutures and a total of 14 sutures were applied. on August  31 she was seen in the ER and it was noted that the wound had dehisced and she was treated with Keflex. She has completed a course of doxycycline recently Medical history also significant for status post abdominal hysterectomy, brain surgery, cardiac catheterization, EGD and bronchoscopy. of late she was back in the ER and was seen last on August 31 and they tried Silvadene 02/22/16: pt reports discomfort and mild pain just below her wound. however, she admits on legs more than usual. no redness, foul odor or purulence. the wound is measuring slightly larger today, but likely secondary to poor edema control. nursing staff gave information to pt today regarding purchase of her compression stockings. she was encouraged to purchase soon. 02/29/16: returns today for f/u. she denies systemic s/s of infection. she brought her new compression stockings today. 03/21/2016 -- She has been put on an antibiotic and prednisone for a bronchitis by her pulmonologist. 04/04/2016 -- she continues to be on prednisone. 04-18-2016 Ms. Dykes is for evaluation of her left lateral leg ulcer. She states that she is still on a titrating dose of prednisone. She also states that she recently received a steroid injection to her left knee. She states that her blood sugars have elevated slightly with the max reading in the 130s. She denies any pain or discomfort to her left leg ulcer. She states that she has been unable to wear her compression stockings and as a result has ankle increased edema to the left leg. Electronic Signature(s) Signed: 05/02/2016 1:27:45 PM By: Christin Fudge MD, FACS Entered By: Christin Fudge on 05/02/2016 13:27:45 TANNA, LOEFFLER ([id number]) MAKAILA, WINDLE ([id number]) -------------------------------------------------------------------------------- Otelia Sergeant TISS Details Patient Name: [name] 05/02/2016 12:45 Date of Service: PM Medical  Record [id number] Number: Patient Account Number: [id number] 05/08/1941 (75 y.o. Treating RN: Montey Hora Date of Birth/Sex: Female) Other Clinician: Primary Care Physician: Glendale Chard Treating Christin Fudge Referring Physician: Glendale Chard Physician/Extender: Weeks in Treatment: 13  Procedure Performed for: Wound #1 Left,Lateral Lower Leg Performed By: Physician Christin Fudge, MD Post Procedure Diagnosis Same as Pre-procedure Notes patient has some hyper granulation tissue which are cauterized with silver nitrate stick Electronic Signature(s) Signed: 05/02/2016 1:27:13 PM By: Christin Fudge MD, FACS Entered By: Christin Fudge on 05/02/2016 13:27:13 [name] ([id number]) -------------------------------------------------------------------------------- Physical Exam Details Patient Name: [name] 05/02/2016 12:45 Date of Service: PM Medical Record [id number] Number: Patient Account Number: [id number] January 22, 1941 (75 y.o. Treating RN: Montey Hora Date of Birth/Sex: Female) Other Clinician: Primary Care Physician: Glendale Chard Treating Yessika Otte Referring Physician: Glendale Chard Physician/Extender: Weeks in Treatment: 13 Constitutional . Pulse regular. Respirations normal and unlabored. Afebrile. . Eyes Nonicteric. Reactive to light. Ears, Nose, Mouth, and Throat Lips, teeth, and gums WNL.[name] Moist mucosa without lesions. Neck supple and nontender. No palpable supraclavicular or cervical adenopathy. Normal sized without goiter. Respiratory WNL. No retractions.. Breath sounds WNL, No rubs, rales, rhonchi, or wheeze.. Cardiovascular Heart rhythm and rate regular, no murmur or Mcdaniel.. Pedal Pulses WNL. No clubbing, cyanosis or edema. Lymphatic No adneopathy. No adenopathy. No adenopathy. Musculoskeletal Adexa without tenderness or enlargement.. Digits and nails w/o clubbing, cyanosis, infection, petechiae, ischemia, or inflammatory  conditions.. Integumentary (Hair, Skin) No suspicious lesions. No crepitus or fluctuance. No peri-wound warmth or erythema. No masses.[name] Psychiatric Judgement and insight Intact.. No evidence of depression, anxiety, or agitation.. Notes the wound again has hyperventilation tissue and I have used a silver nitrate stick to cauterize this Electronic Signature(s) Signed: 05/02/2016 1:28:05 PM By: Christin Fudge MD, FACS Entered By: Christin Fudge on 05/02/2016 13:28:04 [name] ([id number]) -------------------------------------------------------------------------------- Physician Orders Details Patient Name: TERIN, DIEROLF 05/02/2016 12:45 Date of Service: PM Medical Record [id number] Number: Patient Account Number: [id number] 29-Sep-1940 (75 y.o. Treating RN: Montey Hora Date of Birth/Sex: Female) Other Clinician: Primary Care Physician: Glendale Chard Treating Jesson Foskey Referring Physician: Glendale Chard Physician/Extender: Suella Grove in Treatment: 13 Verbal / Phone Orders: Yes Clinician: Montey Hora Read Back and Verified: Yes Diagnosis Coding Wound Cleansing Wound #1 Left,Lateral Lower Leg o Clean wound with Normal Saline. o May Shower, gently pat wound dry prior to applying new dressing. Anesthetic Wound #1 Left,Lateral Lower Leg o Topical Lidocaine 4% cream applied to wound bed prior to debridement Skin Barriers/Peri-Wound Care Wound #1 Left,Lateral Lower Leg o Skin Prep Primary Wound Dressing Wound #1 Left,Lateral Lower Leg o Hydrafera Blue Secondary Dressing Wound #1 Left,Lateral Lower Leg o Dry Gauze o Boardered Foam Dressing Dressing Change Frequency Wound #1 Left,Lateral Lower Leg o Change dressing every other day. Follow-up Appointments Wound #1 Left,Lateral Lower Leg o Return Appointment in 1 week. Edema Control Wound #1 Left,Lateral Lower Leg o Patient to wear own compression stockings JAYLAA, GALLION  ([id number]) Home Health Wound #1 [name] Visits [place] Go [facility] to visit patient 2 times weekly, family to change dressing all other days o Home Health Nurse may visit PRN to address patientos wound care needs. o FACE TO FACE ENCOUNTER: MEDICARE and MEDICAID PATIENTS: I certify that this patient is under my care and that I had a face-to-face encounter that meets the physician face-to-face encounter requirements with this patient on this date. The encounter with the patient was in whole or in part for the following MEDICAL CONDITION: (primary reason for [place]) MEDICAL NECESSITY: I certify, that based on my findings, NURSING services are a medically necessary home health service. HOME BOUND STATUS: I certify that my clinical findings support  that this patient is homebound (i.e., Due to illness or injury, pt requires aid of supportive devices such as crutches, cane, wheelchairs, walkers, the use of special transportation or the assistance of another person to leave their place of residence. There is a normal inability to leave the home and doing so requires considerable and taxing effort. Other absences are for medical reasons / religious services and are infrequent or of short duration when for other reasons). o If current dressing causes regression in wound condition, may D/C ordered dressing product/s and apply Normal Saline Moist Dressing daily until next [place] / Other MD appointment. [place] of regression in wound condition at [phone]. o Please direct any NON-WOUND related issues/requests for orders to patient's Primary Care Physician Services and Therapies o Venous Studies -Bilateral Electronic Signature(s) Signed: 05/02/2016 3:52:57 PM By: Christin Fudge MD, FACS Signed: 05/02/2016 4:48:44 PM By: Montey Hora Entered By: Montey Hora on 05/02/2016 13:36:25 [name]  ([id number]) -------------------------------------------------------------------------------- Problem List Details Patient Name: KEISI, ECKFORD 05/02/2016 12:45 Date of Service: PM Medical Record [id number] Number: Patient Account Number: [id number] 09-Feb-1941 (75 y.o. Treating RN: Montey Hora Date of Birth/Sex: Female) Other Clinician: Primary Care Physician: Glendale Chard Treating Christin Fudge Referring Physician: Glendale Chard Physician/Extender: Weeks in Treatment: 13 Active Problems ICD-10 Encounter Code Description Active Date Diagnosis E11.622 Type 2 diabetes mellitus with other skin ulcer 02/01/2016 Yes L97.222 Non-pressure chronic ulcer of left calf with fat layer 02/01/2016 Yes exposed I89.0 Lymphedema, not elsewhere classified 02/01/2016 Yes Inactive Problems Resolved Problems Electronic Signature(s) Signed: 05/02/2016 1:26:58 PM By: Christin Fudge MD, FACS Entered By: Christin Fudge on 05/02/2016 13:26:58 [name] ([id number]) -------------------------------------------------------------------------------- Progress Note Details Patient Name: [name] 05/02/2016 12:45 Date of Service: PM Medical Record [id number] Number: Patient Account Number: [id number] 1940-11-12 (75 y.o. Treating RN: Montey Hora Date of Birth/Sex: Female) Other Clinician: Primary Care Physician: Glendale Chard Treating Luisdavid Hamblin Referring Physician: Glendale Chard Physician/Extender: Suella Grove in Treatment: 13 Subjective Chief Complaint Information obtained from Patient Ms. Lizer presents for evaluation of her left bilateral leg ulcer History of Present Illness (HPI) The following HPI elements were documented for the patient's wound: Location: left lateral lower extremity Quality: denies pain Severity: denies pain Duration: ulcer has been present for several months Timing: denies pain Context: traumatic injury 75 year old patient with a past medical  history of diabetes mellitus, chronic kidney disease, COPD, hypertension, rheumatoid arthritis had a lacerated wound to her left calf when she injured it on the corner of a car door and it was a long 12 cm laceration. x-ray done at the time revealed that there was no fracture or radiopaque foreign body. the wound was sutured with 4-0 Prolene simple interrupted sutures and a total of 14 sutures were applied. on August 31 she was seen in the ER and it was noted that the wound had dehisced and she was treated with Keflex. She has completed a course of doxycycline recently Medical history also significant for status post abdominal hysterectomy, brain surgery, cardiac catheterization, EGD and bronchoscopy. of late she was back in the ER and was seen last on August 31 and they tried Silvadene 02/22/16: pt reports discomfort and mild pain just below her wound. however, she admits on legs more than usual. no redness, foul odor or purulence. the wound is measuring slightly larger today, but likely secondary to poor edema control. nursing staff gave information to pt today regarding purchase of her compression stockings. she was encouraged to  purchase soon. 02/29/16: returns today for f/u. she denies systemic s/s of infection. she brought her new compression stockings today. 03/21/2016 -- She has been put on an antibiotic and prednisone for a bronchitis by her pulmonologist. 04/04/2016 -- she continues to be on prednisone. 04-18-2016 Ms. Gauntt is for evaluation of her left lateral leg ulcer. She states that she is still on a titrating dose of prednisone. She also states that she recently received a steroid injection to her left knee. She states that her blood sugars have elevated slightly with the max reading in the 130s. She denies any pain or Goral, Pansy E. ([id number]) discomfort to her left leg ulcer. She states that she has been unable to wear her compression stockings and as a result has ankle  increased edema to the left leg. Objective Constitutional Pulse regular. Respirations normal and unlabored. Afebrile. Vitals Time Taken: 12:59 PM, Height: 64 in, Weight: 283 lbs, BMI: 48.6, Temperature: 98.1 F, Pulse: 89 bpm, Respiratory Rate: 20 breaths/min, Blood Pressure: 132/73 mmHg. Eyes Nonicteric. Reactive to light. Ears, Nose, Mouth, and Throat Lips, teeth, and gums WNL.[name] Moist mucosa without lesions. Neck supple and nontender. No palpable supraclavicular or cervical adenopathy. Normal sized without goiter. Respiratory WNL. No retractions.. Breath sounds WNL, No rubs, rales, rhonchi, or wheeze.. Cardiovascular Heart rhythm and rate regular, no murmur or Mcdaniel.. Pedal Pulses WNL. No clubbing, cyanosis or edema. Lymphatic No adneopathy. No adenopathy. No adenopathy. Musculoskeletal Adexa without tenderness or enlargement.. Digits and nails w/o clubbing, cyanosis, infection, petechiae, ischemia, or inflammatory conditions.[name] Psychiatric Judgement and insight Intact.. No evidence of depression, anxiety, or agitation.. General Notes: the wound again has hyperventilation tissue and I have used a silver nitrate stick to cauterize this Integumentary (Hair, Skin) No suspicious lesions. No crepitus or fluctuance. No peri-wound warmth or erythema. No masses.. Wound #1 status is Open. Original cause of wound was Trauma. The wound is located on the [place], Kadiatou E. ([id number]) Lower Leg. The wound measures 2cm length x 1.7cm width x 0.1cm depth; 2.67cm^2 area and 0.267cm^3 volume. The wound is limited to skin breakdown. There is no tunneling or undermining noted. There is a medium amount of sanguinous drainage noted. The wound margin is distinct with the outline attached to the wound base. There is large (67-100%) red granulation within the wound bed. There is a small (1-33%) amount of necrotic tissue within the wound bed including Adherent Slough. The periwound skin  appearance exhibited: Localized Edema, Scarring, Moist, Ecchymosis, Hemosiderin Staining. The periwound skin appearance did not exhibit: Callus, Crepitus, Excoriation, Fluctuance, Friable, Induration, Rash, Dry/Scaly, Maceration, Atrophie Blanche, Cyanosis, Mottled, Pallor, Rubor, Erythema. Periwound temperature was noted as No Abnormality. The periwound has tenderness on palpation. Assessment Active Problems ICD-10 E11.622 - Type 2 diabetes mellitus with other skin ulcer L97.222 - Non-pressure chronic ulcer of left calf with fat layer exposed I89.0 - Lymphedema, not elsewhere classified Procedures Wound #1 Wound #1 is a Diabetic Wound/Ulcer of the Lower Extremity located on the Left,Lateral Lower Leg . An CHEM CAUT GRANULATION TISS procedure was performed by Christin Fudge, MD. Post procedure Diagnosis Wound #1: Same as Pre-Procedure Notes: patient has some hyper granulation tissue which are cauterized with silver nitrate stick Plan Wound Cleansing: Wound #1 Left,Lateral Lower Leg: Clean wound with Normal Saline. May Shower, gently pat wound dry prior to applying new dressing. Anesthetic: Wound #1 Left,Lateral Lower Leg: Topical Lidocaine 4% cream applied to wound bed prior to debridement Skin Barriers/Peri-Wound Care: ZAKARA, PARKEY ([id number]) Wound #1 Left,Lateral  Lower Leg: Skin Prep Primary Wound Dressing: Wound #1 Left,Lateral Lower Leg: Hydrafera Blue Secondary Dressing: Wound #1 Left,Lateral Lower Leg: Dry Gauze Boardered Foam Dressing Dressing Change Frequency: Wound #1 Left,Lateral Lower Leg: Change dressing every other day. Follow-up Appointments: Wound #1 Left,Lateral Lower Leg: Return Appointment in 1 week. Edema Control: Wound #1 Left,Lateral Lower Leg: Patient to wear own compression stockings Home Health: Wound #1 Left,Lateral Lower Leg: Continue Home Health Visits - [facility] Go [facility] to visit patient 2 times weekly, family to change dressing all  other days Home Health Nurse may visit PRN to address patient s wound care needs. FACE TO FACE ENCOUNTER: MEDICARE and MEDICAID PATIENTS: I certify that this patient is under my care and that I had a face-to-face encounter that meets the physician face-to-face encounter requirements with this patient on this date. The encounter with the patient was in whole or in part for the following MEDICAL CONDITION: (primary reason for [place]) MEDICAL NECESSITY: I certify, that based on my findings, NURSING services are a medically necessary home health service. HOME BOUND STATUS: I certify that my clinical findings support that this patient is homebound (i.e., Due to illness or injury, pt requires aid of supportive devices such as crutches, cane, wheelchairs, walkers, the use of special transportation or the assistance of another person to leave their place of residence. There is a normal inability to leave the home and doing so requires considerable and taxing effort. Other absences are for medical reasons / religious services and are infrequent or of short duration when for other reasons). If current dressing causes regression in wound condition, may D/C ordered dressing product/s and apply Normal Saline Moist Dressing daily until next [place] / Other MD appointment. [place] of regression in wound condition at [phone]. Please direct any NON-WOUND related issues/requests for orders to patient's Primary Care Physician Services and Therapies ordered were: Venous Studies -Bilateral I have recommended we use Hydrofera Blue and we should continue her local compression. Since it's been over 12 weeks with conservative therapy at this stage I believe she may have an element of venous hypertension and it may be worth doing a formal venous reflux study and I have discussed this with XANDRIA, GALLAGA E. ([id number]) her and she is agreeable about getting the test done.  We will get this done at vein and vascular at [facility] I have told her to aggressively elevate and exercise to reduce her lymphedema. Electronic Signature(s) Signed: 05/02/2016 3:59:09 PM By: Christin Fudge MD, FACS Previous Signature: 05/02/2016 1:33:17 PM Version By: Christin Fudge MD, FACS Previous Signature: 05/02/2016 1:30:33 PM Version By: Christin Fudge MD, FACS Entered By: Christin Fudge on 05/02/2016 15:59:09 [name] ([id number]) -------------------------------------------------------------------------------- SuperBill Details Patient Name: [name]. Date of Service: 05/02/2016 Medical Record Number: [id number] Patient Account Number: [id number] Date of Birth/Sex: 08-24-1940 (75 y.o. Female) Treating RN: Montey Hora Primary Care Physician: Glendale Chard Other Clinician: Referring Physician: Glendale Chard Treating Physician/Extender: Frann Rider in Treatment: 13 Diagnosis Coding ICD-10 Codes Code Description E11.622 Type 2 diabetes mellitus with other skin ulcer L97.222 Non-pressure chronic ulcer of left calf with fat layer exposed I89.0 Lymphedema, not elsewhere classified Facility Procedures CPT4 Code: [id number] Description: 83779 - CHEM CAUT GRANULATION TISS ICD-10 Description Diagnosis E11.622 Type 2 diabetes mellitus with other skin ulcer L97.222 Non-pressure chronic ulcer of left calf with fat l I89.0 Lymphedema, not elsewhere classified Modifier: ayer exposed Quantity: 1 Physician Procedures CPT4 Code: [id number] Description: 84720 - WC  PHYS CHEM CAUT GRAN TISSUE ICD-10 Description Diagnosis E11.622 Type 2 diabetes mellitus with other skin ulcer L97.222 Non-pressure chronic ulcer of left calf with fat la I89.0 Lymphedema, not elsewhere classified Modifier: yer exposed Quantity: 1 Electronic Signature(s) Signed: 05/02/2016 1:33:28 PM By: Christin Fudge MD, FACS Previous Signature: 05/02/2016 1:30:43 PM Version By: Christin Fudge MD,  FACS Entered By: Christin Fudge on 05/02/2016 13:33:28

## 2016-05-04 NOTE — Progress Notes (Signed)
LATARSHIA, JERSEY ([id number]) Visit Report for 05/02/2016 Arrival Information Details Patient Name: [name], [name]. Date of Service: 05/02/2016 12:45 PM Medical Record Number: [id number] Patient Account Number: [id number] Date of Birth/Sex: 1940/06/28 (75 y.o. Female) Treating RN: Montey Hora Primary Care Physician: Glendale Chard Other Clinician: Referring Physician: Glendale Chard Treating Physician/Extender: Frann Rider in Treatment: 13 Visit Information History Since Last Visit Added or deleted any medications: No Patient Arrived: Walker Any new allergies or adverse reactions: No Arrival Time: 12:48 Had a fall or experienced change in No Accompanied By: self activities of daily living that may affect Transfer Assistance: None risk of falls: Patient Identification Verified: Yes Signs or symptoms of abuse/neglect since last No Secondary Verification Process Completed: Yes visito Patient Has Alerts: Yes Hospitalized since last visit: No Patient Alerts: DM II Has Dressing in Place as Prescribed: Yes Has Compression in Place as Prescribed: Yes Pain Present Now: No Electronic Signature(s) Signed: 05/02/2016 4:48:44 PM By: Montey Hora Entered By: Montey Hora on 05/02/2016 12:59:01 [name] ([id number]) -------------------------------------------------------------------------------- Encounter Discharge Information Details Patient Name: [name]. Date of Service: 05/02/2016 12:45 PM Medical Record Number: [id number] Patient Account Number: [id number] Date of Birth/Sex: 11/28/1940 (75 y.o. Female) Treating RN: Montey Hora Primary Care Physician: Glendale Chard Other Clinician: Referring Physician: Glendale Chard Treating Physician/Extender: Frann Rider in Treatment: 13 Encounter Discharge Information Items Discharge Pain Level: 0 Discharge Condition: Stable Ambulatory Status: Walker Discharge Destination:  Home Transportation: Private Auto Accompanied By: self Schedule Follow-up Appointment: Yes Medication Reconciliation completed and provided to Patient/Care No Wyndell Cardiff: Provided on Clinical Summary of Care: 05/02/2016 Form Type Recipient Paper Patient DG Electronic Signature(s) Signed: 05/02/2016 1:37:16 PM By: Ruthine Dose Entered By: Ruthine Dose on 05/02/2016 13:37:15 [name] ([id number]) -------------------------------------------------------------------------------- Lower Extremity Assessment Details Patient Name: [name]. Date of Service: 05/02/2016 12:45 PM Medical Record Number: [id number] Patient Account Number: [id number] Date of Birth/Sex: Oct 04, 1940 (75 y.o. Female) Treating RN: Montey Hora Primary Care Physician: Glendale Chard Other Clinician: Referring Physician: Glendale Chard Treating Physician/Extender: Frann Rider in Treatment: 13 Edema Assessment Assessed: [Left: No] [Right: No] Edema: [Left: Ye] [Right: s] Calf Left: Right: Point of Measurement: 34 cm From Medial Instep 48 cm cm Ankle Left: Right: Point of Measurement: 11 cm From Medial Instep 29.4 cm cm Vascular Assessment Pulses: Posterior Tibial Dorsalis Pedis Palpable: [Left:Yes] Extremity colors, hair growth, and conditions: Extremity Color: [Left:Hyperpigmented] Hair Growth on Extremity: [Left:No] Temperature of Extremity: [Left:Warm] Capillary Refill: [Left:< 3 seconds] Electronic Signature(s) Signed: 05/02/2016 4:48:44 PM By: Montey Hora Entered By: Montey Hora on 05/02/2016 13:01:55 Mcfetridge, Maybree E. ([id number]) -------------------------------------------------------------------------------- Multi Wound Chart Details Patient Name: [name]. Date of Service: 05/02/2016 12:45 PM Medical Record Number: [id number] Patient Account Number: [id number] Date of Birth/Sex: December 09, 1940 (75 y.o. 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 ([id number]) Wound Margin: Distinct, outline attached N/A N/A Granulation Amount: Large (67-100%) N/A N/A Granulation Quality: Red N/A N/A Necrotic Amount: Small (1-33%) N/A N/A Exposed Structures: Fascia: No N/A N/A Fat: No Tendon: No Muscle: No Joint: No Bone: No Limited to Skin Breakdown Epithelialization: Medium (34-66%) N/A N/A Periwound Skin Texture: Edema: Yes N/A N/A Scarring: Yes Excoriation: No Induration: No Callus: No Crepitus: No Fluctuance: No Friable: No Rash: No Periwound Skin Moist: Yes N/A N/A Moisture: Maceration: No Dry/Scaly: No Periwound Skin Color: Ecchymosis: Yes N/A N/A Hemosiderin Staining: Yes Atrophie Blanche: No Cyanosis: No Erythema: No Mottled: No Pallor: No Rubor: No Temperature: No Abnormality N/A N/A Tenderness on Yes N/A N/A Palpation: Wound  Preparation: Ulcer Cleansing: N/A N/A Rinsed/Irrigated with Saline Topical Anesthetic Applied: Other: lidocaine 4% Treatment Notes Electronic Signature(s) Signed: 05/02/2016 4:48:44 PM By: Montey Hora Entered By: Montey Hora on 05/02/2016 13:25:34 [name] ([id number]) [name] ([id number]) -------------------------------------------------------------------------------- Multi-Disciplinary Care Plan Details Patient Name: SAOIRSE, LEGERE. Date of Service: 05/02/2016 12:45 PM Medical Record Number: [id number] Patient Account Number: [id number] Date of Birth/Sex: 05-04-1941 (75 y.o. Female) Treating RN: Montey Hora Primary Care Physician: Glendale Chard Other Clinician: Referring Physician: Glendale Chard Treating Physician/Extender: Frann Rider in Treatment: 23 Active Inactive Abuse / Safety / Falls / Self Care Management Nursing Diagnoses: Impaired physical mobility Potential for falls Goals: Patient will remain injury free Date Initiated: 02/01/2016 Goal Status: Active Interventions: Assess fall risk on admission and as needed Notes: Orientation to the Wound Care Program Nursing Diagnoses: Knowledge deficit related to the wound healing center program Goals: Patient/caregiver will verbalize understanding of the [facility] Date Initiated: 02/01/2016 Goal Status: Active Interventions: Provide education on orientation to the wound center Notes: Wound/Skin Impairment Nursing Diagnoses: Impaired tissue integrity Goals: Patient/caregiver will verbalize understanding of skin care regimen FINNLEE, GUARNIERI ([id number]) Date Initiated: 02/01/2016 Goal Status: Active Ulcer/skin breakdown will have a volume reduction of 30% by week 4 Date Initiated: 02/01/2016 Goal Status: Active Ulcer/skin breakdown will have a volume reduction of 50% by week 8 Date Initiated: 02/01/2016 Goal Status: Active Ulcer/skin breakdown will  have a volume reduction of 80% by week 12 Date Initiated: 02/01/2016 Goal Status: Active Ulcer/skin breakdown will heal within 14 weeks Date Initiated: 02/01/2016 Goal Status: Active Interventions: Assess patient/caregiver ability to obtain necessary supplies Assess patient/caregiver ability to perform ulcer/skin care regimen upon admission and as needed Assess ulceration(s) every visit Notes: Electronic Signature(s) Signed: 05/02/2016 4:48:44 PM By: Montey Hora Entered By: Montey Hora on 05/02/2016 13:25:27 [name] ([id number]) -------------------------------------------------------------------------------- Pain Assessment Details Patient Name: [name]. Date of Service: 05/02/2016 12:45 PM Medical Record Number: [id number] Patient Account Number: [id number] Date of Birth/Sex: 28-Apr-1941 (75 y.o. Female) Treating RN: Montey Hora Primary Care Physician: Glendale Chard Other Clinician: Referring Physician: Glendale Chard Treating Physician/Extender: Frann Rider in Treatment: 13 Active Problems Location of Pain Severity and Description of Pain Patient Has Paino No Site Locations Pain Management and Medication Current Pain Management: Notes Topical or injectable lidocaine is offered to patient for acute pain when surgical debridement is performed. If needed, Patient is instructed to use over the counter pain medication for the following 24-48 hours after debridement. Wound care MDs  do not prescribed pain medications. Patient has chronic pain or uncontrolled pain. Patient has been instructed to make an appointment with their Primary Care Physician for pain management. Electronic Signature(s) Signed: 05/02/2016 4:48:44 PM By: Montey Hora Entered By: Montey Hora on 05/02/2016 12:59:24 [name] ([id number]) -------------------------------------------------------------------------------- Patient/Caregiver Education Details Patient  Name: [name] Date of Service: 05/02/2016 12:45 PM Medical Record Number: [id number] Patient Account Number: [id number] Date of Birth/Gender: 16-Feb-1941 (75 y.o. Female) Treating RN: Montey Hora Primary Care Physician: Glendale Chard Other Clinician: Referring Physician: Glendale Chard Treating Physician/Extender: Frann Rider in Treatment: 13 Education Assessment Education Provided To: Patient Education Topics Provided Wound/Skin Impairment: Handouts: Other: wound care as ordered Methods: Demonstration, Explain/Verbal Responses: State content correctly Electronic Signature(s) Signed: 05/02/2016 4:48:44 PM By: Montey Hora Entered By: Montey Hora on 05/02/2016 13:36:02 [name] ([id number]) -------------------------------------------------------------------------------- Wound Assessment Details Patient Name: [name]. Date of Service: 05/02/2016 12:45 PM Medical Record Number: [id number] Patient Account Number: [id number] Date of Birth/Sex: 1940-09-28 (75 y.o. Female) Treating RN: Montey Hora Primary Care Physician: Glendale Chard Other Clinician: Referring Physician: Glendale Chard Treating Physician/Extender: Frann Rider in Treatment: 13 Wound Status Wound Number: 1 Primary Diabetic Wound/Ulcer of the Lower Etiology: Extremity Wound Location: Left Lower Leg - Lateral Secondary Auto-immune Wounding Event: Trauma Etiology: Date Acquired: 12/27/2015 Wound Open Weeks Of Treatment: 13 Status: Clustered Wound: No Comorbid Cataracts, Lymphedema, History: Hypertension, Type II Diabetes, Gout, Rheumatoid Arthritis, Osteoarthritis, Neuropathy Photos Wound Measurements Length: (cm) 2 Width: (cm) 1.7 Depth: (cm) 0.1 Area: (cm) 2.67 Volume: (cm) 0.267 % Reduction in Area: 86.5% % Reduction in Volume: 93.3% Epithelialization: Medium (34-66%) Tunneling: No Undermining: No Wound Description Classification:  Grade 2 Wound Margin: Distinct, outline attached Exudate Amount: Medium Exudate Type: Sanguinous Exudate Color: red Wound Bed Granulation Amount: Large (67-100%) Exposed Structure Granulation Quality: Red Fascia Exposed: No Garczynski, Sharian E. ([id number]) Necrotic Amount: Small (1-33%) Fat Layer Exposed: No Necrotic Quality: Adherent Slough Tendon Exposed: No Muscle Exposed: No Joint Exposed: No Bone Exposed: No Limited to Skin Breakdown Periwound Skin Texture Texture Color No Abnormalities Noted: No No Abnormalities Noted: No Callus: No Atrophie Blanche: No Crepitus: No Cyanosis: No Excoriation: No Ecchymosis: Yes Fluctuance: No Erythema: No Friable: No Hemosiderin Staining: Yes Induration: No Mottled: No Localized Edema: Yes Pallor: No Rash: No Rubor: No Scarring: Yes Temperature / Pain Moisture Temperature: No Abnormality No Abnormalities Noted: No Tenderness on Palpation: Yes Dry / Scaly: No Maceration: No Moist: Yes Wound Preparation Ulcer Cleansing: Rinsed/Irrigated with Saline Topical Anesthetic Applied: Other: lidocaine 4%, Treatment Notes Wound #1 (Left, Lateral Lower Leg) 1. Cleansed with: Clean wound with Normal Saline 2. Anesthetic Topical Lidocaine 4% cream to wound bed prior to debridement 4. Dressing Applied: Hydrafera Blue 5. Secondary Dressing Applied Bordered Foam Dressing Dry Gauze 7. Secured with Patient to wear own compression stockings Electronic Signature(s) Signed: 05/02/2016 4:48:44 PM By: Montey Hora Entered By: Montey Hora on 05/02/2016 13:25:15 [name] ([id number]) [name], Edwena Felty ([id number]) -------------------------------------------------------------------------------- Vitals Details Patient Name: [name]. Date of Service: 05/02/2016 12:45 PM Medical Record Number: [id number] Patient Account Number: [id number] Date of Birth/Sex: 1940-11-06 (75 y.o. Female) Treating RN: Montey Hora Primary Care Physician: Glendale Chard Other Clinician: Referring Physician: Glendale Chard Treating Physician/Extender: Frann Rider in Treatment: 13 Vital Signs Time Taken: 12:59 Temperature (F): 98.1 Height (in): 64 Pulse (bpm): 89 Weight (lbs): 283 Respiratory Rate (breaths/min): 20 Body Mass Index (BMI): 48.6 Blood Pressure (mmHg): 132/73 Reference Range: 80 -  120 mg / dl Electronic Signature(s) Signed: 05/02/2016 4:48:44 PM By: Montey Hora Entered By: Montey Hora on 05/02/2016 12:59:42

## 2016-05-06 DIAGNOSIS — I5032 Chronic diastolic (congestive) heart failure: Secondary | ICD-10-CM | POA: Diagnosis not present

## 2016-05-06 DIAGNOSIS — I130 Hypertensive heart and chronic kidney disease with heart failure and stage 1 through stage 4 chronic kidney disease, or unspecified chronic kidney disease: Secondary | ICD-10-CM | POA: Diagnosis not present

## 2016-05-06 DIAGNOSIS — T798XXD Other early complications of trauma, subsequent encounter: Secondary | ICD-10-CM | POA: Diagnosis not present

## 2016-05-06 DIAGNOSIS — E1122 Type 2 diabetes mellitus with diabetic chronic kidney disease: Secondary | ICD-10-CM | POA: Diagnosis not present

## 2016-05-06 DIAGNOSIS — S81812D Laceration without foreign body, left lower leg, subsequent encounter: Secondary | ICD-10-CM | POA: Diagnosis not present

## 2016-05-06 DIAGNOSIS — N182 Chronic kidney disease, stage 2 (mild): Secondary | ICD-10-CM | POA: Diagnosis not present

## 2016-05-08 ENCOUNTER — Encounter: Payer: Medicare Other | Admitting: Nurse Practitioner

## 2016-05-08 DIAGNOSIS — I890 Lymphedema, not elsewhere classified: Secondary | ICD-10-CM | POA: Diagnosis not present

## 2016-05-08 DIAGNOSIS — E1122 Type 2 diabetes mellitus with diabetic chronic kidney disease: Secondary | ICD-10-CM | POA: Diagnosis not present

## 2016-05-08 DIAGNOSIS — J449 Chronic obstructive pulmonary disease, unspecified: Secondary | ICD-10-CM | POA: Diagnosis not present

## 2016-05-08 DIAGNOSIS — N189 Chronic kidney disease, unspecified: Secondary | ICD-10-CM | POA: Diagnosis not present

## 2016-05-08 DIAGNOSIS — L97222 Non-pressure chronic ulcer of left calf with fat layer exposed: Secondary | ICD-10-CM | POA: Diagnosis not present

## 2016-05-08 DIAGNOSIS — E11622 Type 2 diabetes mellitus with other skin ulcer: Secondary | ICD-10-CM | POA: Diagnosis not present

## 2016-05-08 DIAGNOSIS — L928 Other granulomatous disorders of the skin and subcutaneous tissue: Secondary | ICD-10-CM | POA: Diagnosis not present

## 2016-05-09 ENCOUNTER — Other Ambulatory Visit: Payer: Self-pay | Admitting: Internal Medicine

## 2016-05-09 NOTE — Progress Notes (Signed)
[name], [name] ([id number]) Visit Report for 05/08/2016 Chief Complaint Document Details Patient Name: [name], [name] 05/08/2016 3:00 Date of Service: PM Medical Record [id number] Number: Patient Account Number: [id number] 06-28-1940 (75 y.o. Treating RN: Montey Hora Date of Birth/Sex: Female) Other Clinician: Primary Care Physician: Glendale Chard Treating Makalyn Lennox Referring Physician: Glendale Chard Physician/Extender: Suella Grove in Treatment: 13 Information Obtained from: Patient Chief Complaint Ms. Dowe presents for evaluation of her left leg ulcer Electronic Signature(s) Signed: 05/08/2016 5:24:39 PM By: Rene Kocher, NP, Marlicia Sroka Entered By: Rene Kocher, NP, Brittan Butterbaugh Mcdaniel 05/08/2016 17:24:39 [name] ([id number]) -------------------------------------------------------------------------------- HPI Details Patient Name: [name] 05/08/2016 3:00 Date of Service: PM Medical Record [id number] Number: Patient Account Number: [id number] 1940-05-27 (75 y.o. Treating RN: Montey Hora Date of Birth/Sex: Female) Other Clinician: Primary Care Physician: Glendale Chard Treating Maximum Reiland Referring Physician: Glendale Chard Physician/Extender: Weeks in Treatment: 13 History of Present Illness Location: left lateral lower extremity Quality: denies pain Severity: denies pain Duration: ulcer has been present for several months Timing: denies pain Context: traumatic injury HPI Description: 75 year old patient with a past medical history of diabetes mellitus, chronic kidney disease, COPD, hypertension, rheumatoid arthritis had a lacerated wound to her left calf when she injured it Mcdaniel the corner of a car door and it was a long 12 cm laceration. x-ray done at the time revealed that there was no fracture or radiopaque foreign body. the wound was sutured with 4-0 Prolene simple interrupted sutures and a total of 14 sutures were applied. Mcdaniel August 31 she was seen  in the ER and it was noted that the wound had dehisced and she was treated with Keflex. She has completed a course of doxycycline recently Medical history also significant for status post abdominal hysterectomy, brain surgery, cardiac catheterization, EGD and bronchoscopy. of late she was back in the ER and was seen last Mcdaniel August 31 and they tried Silvadene 02/22/16: pt reports discomfort and mild pain just below her wound. however, she admits Mcdaniel legs more than usual. no redness, foul odor or purulence. the wound is measuring slightly larger today, but likely secondary to poor edema control. nursing staff gave information to pt today regarding purchase of her compression stockings. she was encouraged to purchase soon. 02/29/16: returns today for f/u. she denies systemic s/s of infection. she brought her new compression stockings today. 03/21/2016 -- She has been put Mcdaniel an antibiotic and prednisone for a bronchitis by her pulmonologist. 04/04/2016 -- she continues to be Mcdaniel prednisone. 04-18-2016 Ms. Eichler is for evaluation of her left lateral leg ulcer. She states that she is still Mcdaniel a titrating dose of prednisone. She also states that she recently received a steroid injection to her left knee. She states that her blood sugars have elevated slightly with the max reading in the 130s. She denies any pain or discomfort to her left leg ulcer. She states that she has been unable to wear her compression stockings and as a result has ankle increased edema to the left leg. 05/08/2016 - Ms. Corter returns today for evaluation of her left lateral leg ulcer. She states that she has not been able to wear her compression hose due to her arthritis and pain in her hands. She denies any issues with her wound. [name], [name] ([id number]) Electronic Signature(s) Signed: 05/08/2016 5:30:23 PM By: Rene Kocher, NP, Jana Hakim By: Rene Kocher, NP, Corde Antonini Mcdaniel 05/08/2016 17:30:23 [name]  ([id number]) -------------------------------------------------------------------------------- Otelia Sergeant TISS Details Patient Name: [name], [name] 05/08/2016 3:00  Date of Service: PM Medical Record [id number] Number: Patient Account Number: [id number] 1941-03-15 (75 y.o. Treating RN: Montey Hora Date of Birth/Sex: Female) Other Clinician: Primary Care Physician: Glendale Chard Treating Mariena Meares Referring Physician: Glendale Chard Physician/Extender: Suella Grove in Treatment: 13 Procedure Performed for: Wound #1 Left,Lateral Lower Leg Performed By: Physician Lawanda Cousins, NP Post Procedure Diagnosis Same as Pre-procedure Electronic Signature(s) Signed: 05/08/2016 5:33:27 PM By: Montey Hora Entered By: Montey Hora Mcdaniel 05/08/2016 16:08:32 [name] ([id number]) -------------------------------------------------------------------------------- Physical Exam Details Patient Name: [name], [name] 05/08/2016 3:00 Date of Service: PM Medical Record [id number] Number: Patient Account Number: [id number] 06-20-1940 (75 y.o. Treating RN: Montey Hora Date of Birth/Sex: Female) Other Clinician: Primary Care Physician: Glendale Chard Treating Kaileena Obi Referring Physician: Glendale Chard Physician/Extender: Weeks in Treatment: 13 Constitutional BP within normal limits. afebrile. well nourished; well developed; appears fatigued. Respiratory non-labored respiratory effort. Cardiovascular pitting edema. Musculoskeletal ambulates with cane. Integumentary (Hair, Skin) no periwound erythema, no tissue necrosis, red hyper granulation tissue throughout. no induration, no fluctuance, denies pain. Psychiatric oriented to time, place, person and situation. calm, pleasant, conversive. Electronic Signature(s) Signed: 05/08/2016 5:33:42 PM By: Rene Kocher, NP, Jana Hakim By: Rene Kocher, NP, Olumide Dolinger Mcdaniel 05/08/2016 17:33:42 [name]  ([id number]) -------------------------------------------------------------------------------- Physician Orders Details Patient Name: [name], [name] 05/08/2016 3:00 Date of Service: PM Medical Record [id number] Number: Patient Account Number: [id number] April 02, 1941 (75 y.o. Treating RN: Montey Hora Date of Birth/Sex: Female) Other Clinician: Primary Care Physician: Glendale Chard Treating Dayln Tugwell Referring Physician: Glendale Chard Physician/Extender: Suella Grove in Treatment: 13 Verbal / Phone Orders: Yes Clinician: Montey Hora Read Back and Verified: Yes Diagnosis Coding Wound Cleansing Wound #1 Left,Lateral Lower Leg o Clean wound with Normal Saline. o May Shower, gently pat wound dry prior to applying new dressing. Anesthetic Wound #1 Left,Lateral Lower Leg o Topical Lidocaine 4% cream applied to wound bed prior to debridement Skin Barriers/Peri-Wound Care Wound #1 Left,Lateral Lower Leg o Skin Prep Primary Wound Dressing Wound #1 Left,Lateral Lower Leg o Hydrafera Blue Secondary Dressing Wound #1 Left,Lateral Lower Leg o ABD pad o Dry Gauze Dressing Change Frequency Wound #1 Left,Lateral Lower Leg o Other: - twice weekly - once by [facility] and once at [place] Follow-up Appointments Wound #1 Left,Lateral Lower Leg o Return Appointment in 1 week. Edema Control Wound #1 Left,Lateral Lower Leg o 3 Layer Compression System - Left Lower Extremity AKAYLAH, LALLEY ([id number]) Home Health Wound #1 Left,Lateral Lower Leg o [facility] Visits - [facility] Nurse may visit PRN to address patientos wound care needs. o FACE TO FACE ENCOUNTER: MEDICARE and MEDICAID PATIENTS: I certify that this patient is under my care and that I had a face-to-face encounter that meets the physician face-to-face encounter requirements with this patient Mcdaniel this date. The encounter with the patient was in whole or in part  for the following MEDICAL CONDITION: (primary reason for [place]) MEDICAL NECESSITY: I certify, that based Mcdaniel my findings, NURSING services are a medically necessary home health service. HOME BOUND STATUS: I certify that my clinical findings support that this patient is homebound (i.e., Due to illness or injury, pt requires aid of supportive devices such as crutches, cane, wheelchairs, walkers, the use of special transportation or the assistance of another person to leave their place of residence. There is a normal inability to leave the home and doing so requires considerable and taxing effort. Other absences are for medical reasons / religious services and are infrequent or  of short duration when for other reasons). o If current dressing causes regression in wound condition, may D/C ordered dressing product/s and apply Normal Saline Moist Dressing daily until next [address] / Other MD appointment. [place] of regression in wound condition at [phone]. o Please direct any NON-WOUND related issues/requests for orders to patient's Primary Care Physician Electronic Signature(s) Signed: 05/08/2016 5:35:41 PM By: Rene Kocher, NP, Ramsay Bognar Previous Signature: 05/08/2016 5:33:27 PM Version By: Montey Hora Entered By: Rene Kocher, NP, Quin Mcpherson Mcdaniel 05/08/2016 17:35:41 [name] ([id number]) -------------------------------------------------------------------------------- Problem List Details Patient Name: [name], [name] 05/08/2016 3:00 Date of Service: PM Medical Record [id number] Number: Patient Account Number: [id number] 1940/07/18 (75 y.o. Treating RN: Montey Hora Date of Birth/Sex: Female) Other Clinician: Primary Care Physician: Glendale Chard Treating Tiffany Talarico Referring Physician: Glendale Chard Physician/Extender: Weeks in Treatment: 13 Active Problems ICD-10 Encounter Code Description Active Date Diagnosis E11.622 Type 2  diabetes mellitus with other skin ulcer 02/01/2016 Yes L97.222 Non-pressure chronic ulcer of left calf with fat layer 02/01/2016 Yes exposed I89.0 Lymphedema, not elsewhere classified 02/01/2016 Yes Inactive Problems Resolved Problems Electronic Signature(s) Signed: 05/08/2016 5:24:17 PM By: Rene Kocher, NP, Svetlana Bagby Entered By: Rene Kocher, NP, Desman Polak Mcdaniel 05/08/2016 17:24:17 [name] ([id number]) -------------------------------------------------------------------------------- Progress Note Details Patient Name: [name]. 05/08/2016 3:00 Date of Service: PM Medical Record [id number] Number: Patient Account Number: [id number] 1940/10/14 (74 y.o. Treating RN: Montey Hora Date of Birth/Sex: Female) Other Clinician: Primary Care Physician: Glendale Chard Treating Jamarius Saha Referring Physician: Glendale Chard Physician/Extender: Suella Grove in Treatment: 13 Subjective Chief Complaint Information obtained from Patient Ms. Lull presents for evaluation of her left leg ulcer History of Present Illness (HPI) The following HPI elements were documented for the patient's wound: Location: left lateral lower extremity Quality: denies pain Severity: denies pain Duration: ulcer has been present for several months Timing: denies pain Context: traumatic injury 75 year old patient with a past medical history of diabetes mellitus, chronic kidney disease, COPD, hypertension, rheumatoid arthritis had a lacerated wound to her left calf when she injured it Mcdaniel the corner of a car door and it was a long 12 cm laceration. x-ray done at the time revealed that there was no fracture or radiopaque foreign body. the wound was sutured with 4-0 Prolene simple interrupted sutures and a total of 14 sutures were applied. Mcdaniel August 31 she was seen in the ER and it was noted that the wound had dehisced and she was treated with Keflex. She has completed a course of doxycycline recently Medical history also  significant for status post abdominal hysterectomy, brain surgery, cardiac catheterization, EGD and bronchoscopy. of late she was back in the ER and was seen last Mcdaniel August 31 and they tried Silvadene 02/22/16: pt reports discomfort and mild pain just below her wound. however, she admits Mcdaniel legs more than usual. no redness, foul odor or purulence. the wound is measuring slightly larger today, but likely secondary to poor edema control. nursing staff gave information to pt today regarding purchase of her compression stockings. she was encouraged to purchase soon. 02/29/16: returns today for f/u. she denies systemic s/s of infection. she brought her new compression stockings today. 03/21/2016 -- She has been put Mcdaniel an antibiotic and prednisone for a bronchitis by her pulmonologist. 04/04/2016 -- she continues to be Mcdaniel prednisone. 04-18-2016 Ms. Yonan is for evaluation of her left lateral leg ulcer. She states that she is still Mcdaniel a titrating dose of prednisone. She also states that she recently received  a steroid injection to her left knee. She states that her blood sugars have elevated slightly with the max reading in the 130s. She denies any pain or [name], [name] ([id number]) discomfort to her left leg ulcer. She states that she has been unable to wear her compression stockings and as a result has ankle increased edema to the left leg. 05/08/2016 - Ms. Sweaney returns today for evaluation of her left lateral leg ulcer. She states that she has not been able to wear her compression hose due to her arthritis and pain in her hands. She denies any issues with her wound. Objective Constitutional BP within normal limits. afebrile. well nourished; well developed; appears fatigued. Vitals Time Taken: 3:41 PM, Height: 64 in, Weight: 283 lbs, BMI: 48.6, Temperature: 98.3 F, Pulse: 89 bpm, Respiratory Rate: 20 breaths/min, Blood Pressure: 124/73 mmHg. Respiratory non-labored respiratory  effort. Cardiovascular pitting edema. Musculoskeletal ambulates with cane. Psychiatric oriented to time, place, person and situation. calm, pleasant, conversive. Integumentary (Hair, Skin) no periwound erythema, no tissue necrosis, red hyper granulation tissue throughout. no induration, no fluctuance, denies pain. Wound #1 status is Open. Original cause of wound was Trauma. The wound is located Mcdaniel the Left,Lateral Lower Leg. The wound measures 1.5cm length x 1.3cm width x 0.1cm depth; 1.532cm^2 area and 0.153cm^3 volume. The wound is limited to skin breakdown. There is no tunneling or undermining noted. There is a medium amount of sanguinous drainage noted. The wound margin is distinct with the outline attached to the wound base. There is large (67-100%) red granulation within the wound bed. There is a small (1-33%) amount of necrotic tissue within the wound bed including Adherent Slough. The periwound skin appearance exhibited: Localized Edema, Scarring, Moist, Ecchymosis, Hemosiderin Staining. The periwound skin appearance did not exhibit: Callus, Crepitus, Excoriation, Fluctuance, Friable, Induration, Rash, Dry/Scaly, Maceration, Atrophie Blanche, Cyanosis, Mottled, Pallor, Rubor, Erythema. Periwound temperature was noted as No Abnormality. The periwound has tenderness Mcdaniel palpation. [name], [name] ([id number]) Assessment Active Problems ICD-10 E11.622 - Type 2 diabetes mellitus with other skin ulcer L97.222 - Non-pressure chronic ulcer of left calf with fat layer exposed I89.0 - Lymphedema, not elsewhere classified Procedures Wound #1 Wound #1 is a Diabetic Wound/Ulcer of the Lower Extremity located Mcdaniel the Left,Lateral Lower Leg . An CHEM CAUT GRANULATION TISS procedure was performed by Lawanda Cousins, NP. Post procedure Diagnosis Wound #1: Same as Pre-Procedure Plan Wound Cleansing: Wound #1 Left,Lateral Lower Leg: Clean wound with Normal Saline. May Shower, gently pat  wound dry prior to applying new dressing. Anesthetic: Wound #1 Left,Lateral Lower Leg: Topical Lidocaine 4% cream applied to wound bed prior to debridement Skin Barriers/Peri-Wound Care: Wound #1 Left,Lateral Lower Leg: Skin Prep Primary Wound Dressing: Wound #1 Left,Lateral Lower Leg: Hydrafera Blue Secondary Dressing: Wound #1 Left,Lateral Lower Leg: ABD pad Dry Gauze Dressing Change Frequency: Wound #1 Left,Lateral Lower Leg: [name], [name] ([id number]) Other: - twice weekly - once by [facility] and once at [place] Follow-up Appointments: Wound #1 Left,Lateral Lower Leg: Return Appointment in 1 week. Edema Control: Wound #1 Left,Lateral Lower Leg: 3 Layer Compression System - Left Lower Extremity Home Health: Wound #1 Left,Lateral Lower Leg: Continue [facility] Health Visits - [facility] Nurse may visit PRN to address patient s wound care needs. FACE TO FACE ENCOUNTER: MEDICARE and MEDICAID PATIENTS: I certify that this patient is under my care and that I had a face-to-face encounter that meets the physician face-to-face encounter requirements with this patient Mcdaniel this date.  The encounter with the patient was in whole or in part for the following MEDICAL CONDITION: (primary reason for [place]) MEDICAL NECESSITY: I certify, that based Mcdaniel my findings, NURSING services are a medically necessary home health service. HOME BOUND STATUS: I certify that my clinical findings support that this patient is homebound (i.e., Due to illness or injury, pt requires aid of supportive devices such as crutches, cane, wheelchairs, walkers, the use of special transportation or the assistance of another person to leave their place of residence. There is a normal inability to leave the home and doing so requires considerable and taxing effort. Other absences are for medical reasons / religious services and are infrequent or of short duration when for other reasons). If  current dressing causes regression in wound condition, may D/C ordered dressing product/s and apply Normal Saline Moist Dressing daily until next [place] / Other MD appointment. [place] of regression in wound condition at [phone]. Please direct any NON-WOUND related issues/requests for orders to patient's Primary Care Physician Follow-Up Appointments: A follow-up appointment should be scheduled. A Patient Clinical Summary of Care was provided to [facility] 1. Chemical cautery for hypergranular tissue 2. Continue with Hydrofera Blue 3. Will put 3 layer compression Mcdaniel in light of patient's inability to apply compression hose 4. Follow-up next week Electronic Signature(s) Signed: 05/08/2016 5:36:52 PM By: Rene Kocher, NP, Maiana Hennigan Entered By: Rene Kocher, NP, Ernestina Joe Mcdaniel 05/08/2016 17:36:52 [place]REE, [name] ([name], Beda Johnette Abraham ([id number]) -------------------------------------------------------------------------------- SuperBill Details Patient Name: [name] Date of Service: 05/08/2016 Medical Record Number: [id number] Patient Account Number: [id number] Date of Birth/Sex: 1941-02-12 (75 y.o. Female) Treating RN: Montey Hora Primary Care Physician: Glendale Chard Other Clinician: Referring Physician: Glendale Chard Treating Physician/Extender: Cathie Olden in Treatment: 13 Diagnosis Coding ICD-10 Codes Code Description E11.622 Type 2 diabetes mellitus with other skin ulcer L97.222 Non-pressure chronic ulcer of left calf with fat layer exposed I89.0 Lymphedema, not elsewhere classified Facility Procedures CPT4 Code: [id number] Description: 03888 - CHEM CAUT GRANULATION TISS ICD-10 Description Diagnosis E11.622 Type 2 diabetes mellitus with other skin ulcer L97.222 Non-pressure chronic ulcer of left calf with fat l Modifier: ayer exposed Quantity: 1 Physician Procedures CPT4 Code: [id number] Description: 17915 - WC PHYS CHEM CAUT GRAN  TISSUE ICD-10 Description Diagnosis E11.622 Type 2 diabetes mellitus with other skin ulcer L97.222 Non-pressure chronic ulcer of left calf with fat la Modifier: yer exposed Quantity: 1 Electronic Signature(s) Signed: 05/08/2016 5:37:05 PM By: Rene Kocher, NP, Estellar Cadena Entered By: Rene Kocher, NP, Amandajo Gonder Mcdaniel 05/08/2016 17:37:05

## 2016-05-09 NOTE — Progress Notes (Signed)
SHELSIE, TIJERINO ([id number]) Visit Report for 05/08/2016 Arrival Information Details Patient Name: [name], [name]. Date of Service: 05/08/2016 3:00 PM Medical Record Number: [id number] Patient Account Number: [id number] Date of Birth/Sex: June 27, 1940 (75 y.o. Female) Treating RN: Montey Hora Primary Care Physician: Glendale Chard Other Clinician: Referring Physician: Glendale Chard Treating Physician/Extender: Cathie Olden in Treatment: 13 Visit Information History Since Last Visit Added or deleted any medications: No Patient Arrived: Walker Any new allergies or adverse reactions: No Arrival Time: 15:38 Had a fall or experienced change in No Accompanied By: self activities of daily living that may affect Transfer Assistance: None risk of falls: Patient Identification Verified: Yes Signs or symptoms of abuse/neglect since last No Secondary Verification Process Completed: Yes visito Patient Has Alerts: Yes Hospitalized since last visit: No Patient Alerts: DM II Pain Present Now: No Electronic Signature(s) Signed: 05/08/2016 5:33:27 PM By: Montey Hora Entered By: Montey Hora on 05/08/2016 15:39:17 [name] ([id number]) -------------------------------------------------------------------------------- Encounter Discharge Information Details Patient Name: [name]. Date of Service: 05/08/2016 3:00 PM Medical Record Number: [id number] Patient Account Number: [id number] Date of Birth/Sex: [birth year] (75 y.o. Female) Treating RN: Montey Hora Primary Care Physician: Glendale Chard Other Clinician: Referring Physician: Glendale Chard Treating Physician/Extender: Cathie Olden in Treatment: 13 Encounter Discharge Information Items Discharge Pain Level: 0 Discharge Condition: Stable Ambulatory Status: Walker Discharge Destination: Home Transportation: Private Auto Accompanied By: self Schedule Follow-up Appointment:  Yes Medication Reconciliation completed and provided to Patient/Care No [name]: Provided on Clinical Summary of Care: 05/08/2016 Form Type Recipient Paper Patient DG Electronic Signature(s) Signed: 05/08/2016 4:43:51 PM By: Montey Hora Previous Signature: 05/08/2016 4:26:03 PM Version By: Ruthine Dose Entered By: Montey Hora on 05/08/2016 16:43:51 [name], [name] -------------------------------------------------------------------------------- Lower Extremity Assessment Details Patient Name: [name]. Date of Service: 05/08/2016 3:00 PM Medical Record Number: [id number] Patient Account Number: [id number] Date of Birth/Sex: Jun 24, 1940 (75 y.o. Female) Treating RN: Montey Hora Primary Care Physician: Glendale Chard Other Clinician: Referring Physician: Glendale Chard Treating Physician/Extender: Cathie Olden in Treatment: 13 Edema Assessment Assessed: [Left: No] [Right: No] Edema: [Left: Ye] [Right: s] Calf Left: Right: Point of Measurement: 34 cm From Medial Instep 48 cm cm Ankle Left: Right: Point of Measurement: 11 cm From Medial Instep 31 cm cm Vascular Assessment Pulses: Dorsalis Pedis Palpable: [Left:Yes] Posterior Tibial Extremity colors, hair growth, and conditions: Extremity Color: [Left:Hyperpigmented] Hair Growth on Extremity: [Left:No] Temperature of Extremity: [Left:Warm] Capillary Refill: [Left:< 3 seconds] Electronic Signature(s) Signed: 05/08/2016 5:33:27 PM By: Montey Hora Entered By: Montey Hora on 05/08/2016 15:46:57 [name], [name] -------------------------------------------------------------------------------- Multi Wound Chart Details Patient Name: [name]. Date of Service: 05/08/2016 3:00 PM Medical Record Number: [id number] Patient Account Number: [id number] Date of Birth/Sex: [birth year] (75 y.o. 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 Wound Margin: Distinct, outline attached N/A N/A Granulation Amount: Large (67-100%) N/A N/A Granulation Quality: Red N/A N/A Necrotic Amount: Small (1-33%) N/A N/A Exposed Structures: Fascia: No N/A N/A Fat: No Tendon: No Muscle: No Joint: No Bone: No Limited to Skin Breakdown Epithelialization: Medium (34-66%) N/A N/A Periwound Skin Texture: Edema: Yes N/A N/A Scarring: Yes Excoriation: No Induration: No Callus: No Crepitus: No Fluctuance: No Friable: No Rash: No Periwound Skin Moist: Yes N/A N/A Moisture: Maceration: No Dry/Scaly: No Periwound Skin Color: Ecchymosis: Yes N/A N/A Hemosiderin Staining: Yes Atrophie Blanche: No Cyanosis: No Erythema: No Mottled: No Pallor: No Rubor: No Temperature: No Abnormality N/A N/A Tenderness on Yes N/A N/A Palpation: Wound Preparation: Ulcer  Cleansing: N/A N/A Rinsed/Irrigated with Saline Topical Anesthetic Applied: Other: lidocaine 4% Procedures Performed: CHEM CAUT N/A N/A GRANULATION TISS Treatment Notes Wound #1 (Left, Lateral Lower Leg) 1. Cleansed with: Clean wound with Normal Saline [name], [name] 2. Anesthetic Topical Lidocaine 4% cream to wound bed prior to debridement 4. Dressing Applied: Hydrafera Blue 5. Secondary Dressing Applied ABD Pad 7. Secured with 3 Layer Compression System - Left Lower Extremity Electronic Signature(s) Signed: 05/08/2016 5:24:24 PM By: Rene Kocher, NP, [name] Entered By: Rene Kocher, NP, [name] on 05/08/2016 17:24:24 [name] ([id number]) -------------------------------------------------------------------------------- [place] Details Patient Name: IPEK, WESTRA. Date of Service: 05/08/2016 3:00 PM Medical Record Number: [id number] Patient Account Number: [id number] Date of Birth/Sex: 1940/08/30 (75 y.o. Female) Treating RN: Montey Hora Primary Care Physician: Glendale Chard Other Clinician: Referring Physician: Glendale Chard Treating Physician/Extender: Cathie Olden in Treatment: 80 Active Inactive Abuse / Safety / Falls / Self Care Management Nursing Diagnoses: Impaired physical mobility Potential for falls Goals: Patient will remain injury free Date Initiated: 02/01/2016 Goal Status: Active Interventions: Assess fall risk on admission and as needed Notes: Orientation to the Wound Care Program Nursing Diagnoses: Knowledge deficit related to the wound healing center program Goals: Patient/caregiver will verbalize understanding of the [place] Program Date Initiated: 02/01/2016 Goal Status: Active Interventions: Provide education on orientation to the wound center Notes: Wound/Skin Impairment Nursing Diagnoses: Impaired tissue integrity Goals: Patient/caregiver will verbalize understanding of skin  care regimen ALANII, RAMER ([id number]) Date Initiated: 02/01/2016 Goal Status: Active Ulcer/skin breakdown will have a volume reduction of 30% by week 4 Date Initiated: 02/01/2016 Goal Status: Active Ulcer/skin breakdown will have a volume reduction of 50% by week 8 Date Initiated: 02/01/2016 Goal Status: Active Ulcer/skin breakdown will have a volume reduction of 80% by week 12 Date Initiated: 02/01/2016 Goal Status: Active Ulcer/skin breakdown will heal within 14 weeks Date Initiated: 02/01/2016 Goal Status: Active Interventions: Assess patient/caregiver ability to obtain necessary supplies Assess patient/caregiver ability to perform ulcer/skin care regimen upon admission and as needed Assess ulceration(s) every visit Notes: Electronic Signature(s) Signed: 05/08/2016 5:33:27 PM By: Montey Hora Entered By: Montey Hora on 05/08/2016 Mancelona, Lonsdale. ([id number]) -------------------------------------------------------------------------------- Pain Assessment Details Patient Name: [name]. Date of Service: 05/08/2016 3:00 PM Medical Record Number: [id number] Patient Account Number: [id number] Date of Birth/Sex: 1940/08/09 (75 y.o. Female) Treating RN: Montey Hora Primary Care Physician: Glendale Chard Other Clinician: Referring Physician: Glendale Chard Treating Physician/Extender: Cathie Olden in Treatment: 13 Active Problems Location of Pain Severity and Description of Pain  Patient Has Paino No Site Locations Pain Management and Medication Current Pain Management: Notes Topical or injectable lidocaine is offered to patient for acute pain when surgical debridement is performed. If needed, Patient is instructed to use over the counter pain medication for the following 24-48 hours after debridement. Wound care MDs do not prescribed pain medications. Patient has chronic pain or uncontrolled pain. Patient has been instructed to make an  appointment with their Primary Care Physician for pain management. Electronic Signature(s) Signed: 05/08/2016 5:33:27 PM By: Montey Hora Entered By: Montey Hora on 05/08/2016 15:39:38 [name] ([id number]) -------------------------------------------------------------------------------- Patient/Caregiver Education Details Patient Name: [name] Date of Service: 05/08/2016 3:00 PM Medical Record Number: [id number] Patient Account Number: [id number] Date of Birth/Gender: 1940/10/10 (75 y.o. Female) Treating RN: Montey Hora Primary Care Physician: Glendale Chard Other Clinician: Referring Physician: Glendale Chard Treating Physician/Extender: Cathie Olden in Treatment: 13 Education Assessment Education Provided To: Patient Education Topics Provided Venous: Handouts: Other: leg elevation Methods: Explain/Verbal Responses: State content correctly Electronic Signature(s) Signed: 05/08/2016 5:33:27 PM By: Montey Hora Entered By: Montey Hora on 05/08/2016 16:57:04 Molder, [name] ([id number]) -------------------------------------------------------------------------------- Wound Assessment Details Patient Name: [name]. Date of Service: 05/08/2016 3:00 PM Medical Record Number: [id number] Patient Account Number: [id number] Date of Birth/Sex: [birth year] (75 y.o. Female) Treating RN: Montey Hora Primary Care Physician: Glendale Chard Other Clinician: Referring Physician: Glendale Chard Treating Physician/Extender: Cathie Olden in Treatment: 13 Wound Status Wound Number: 1 Primary Diabetic Wound/Ulcer of the Lower Etiology: Extremity Wound Location: Left Lower Leg - Lateral Secondary Auto-immune Wounding Event: Trauma Etiology: Date Acquired: 12/27/2015 Wound Open Weeks Of Treatment: 13 Status: Clustered Wound: No Comorbid Cataracts, Lymphedema, History: Hypertension, Type II Diabetes, Gout, Rheumatoid  Arthritis, Osteoarthritis, Neuropathy Photos Wound Measurements Length: (cm) 1.5 Width: (cm) 1.3 Depth: (cm) 0.1 Area: (cm) 1.532 Volume: (cm) 0.153 % Reduction in Area: 92.3% % Reduction in Volume: 96.1% Epithelialization: Medium (34-66%) Tunneling: No Undermining: No Wound Description Classification: Grade 2 Wound Margin: Distinct, outline attached Exudate Amount: Medium Exudate Type: Sanguinous Exudate Color: red Wound Bed Granulation Amount: Large (67-100%) Exposed Structure Granulation Quality: Red Fascia Exposed: No Cham, [name] ([id number]) Necrotic Amount: Small (1-33%) Fat Layer Exposed: No Necrotic Quality: Adherent Slough Tendon Exposed: No Muscle Exposed: No Joint Exposed: No Bone Exposed: No Limited to Skin Breakdown Periwound Skin Texture Texture Color No Abnormalities Noted: No No Abnormalities Noted: No Callus: No Atrophie Blanche: No Crepitus: No Cyanosis: No Excoriation: No Ecchymosis: Yes Fluctuance: No Erythema: No Friable: No Hemosiderin Staining: Yes Induration: No Mottled: No Localized Edema: Yes Pallor: No Rash: No Rubor: No Scarring: Yes Temperature / Pain Moisture Temperature: No Abnormality No Abnormalities Noted: No Tenderness on Palpation: Yes Dry / Scaly: No Maceration: No Moist: Yes Wound Preparation Ulcer Cleansing: Rinsed/Irrigated with Saline Topical Anesthetic Applied: Other: lidocaine 4%, Treatment Notes Wound #1 (Left, Lateral Lower Leg) 1. Cleansed with: Clean wound with Normal Saline 2. Anesthetic Topical Lidocaine 4% cream to wound bed prior to debridement 4. Dressing Applied: Hydrafera Blue 5. Secondary Dressing Applied ABD Pad 7. Secured with 3 Layer Compression System - Left Lower Extremity Electronic Signature(s) Signed: 05/08/2016 5:33:27 PM By: Montey Hora Entered By: Montey Hora on 05/08/2016 15:57:09 [name] ([id number]) [name], [name]  ([id number]) -------------------------------------------------------------------------------- Vitals Details Patient Name: [name]. Date of Service: 05/08/2016 3:00 PM Medical Record Number: [id number] Patient Account Number: [id number] Date of Birth/Sex: Sep 13, 1940 (75 y.o. Female) Treating RN: Montey Hora Primary Care Physician: Glendale Chard Other  Clinician: Referring Physician: Glendale Chard Treating Physician/Extender: Cathie Olden in Treatment: 13 Vital Signs Time Taken: 15:41 Temperature (F): 98.3 Height (in): 64 Pulse (bpm): 89 Weight (lbs): 283 Respiratory Rate (breaths/min): 20 Body Mass Index (BMI): 48.6 Blood Pressure (mmHg): 124/73 Reference Range: 80 - 120 mg / dl Electronic Signature(s) Signed: 05/08/2016 5:33:27 PM By: Montey Hora Entered By: Montey Hora on 05/08/2016 15:42:41

## 2016-05-13 DIAGNOSIS — I130 Hypertensive heart and chronic kidney disease with heart failure and stage 1 through stage 4 chronic kidney disease, or unspecified chronic kidney disease: Secondary | ICD-10-CM | POA: Diagnosis not present

## 2016-05-13 DIAGNOSIS — I5032 Chronic diastolic (congestive) heart failure: Secondary | ICD-10-CM | POA: Diagnosis not present

## 2016-05-13 DIAGNOSIS — E1122 Type 2 diabetes mellitus with diabetic chronic kidney disease: Secondary | ICD-10-CM | POA: Diagnosis not present

## 2016-05-13 DIAGNOSIS — S81812D Laceration without foreign body, left lower leg, subsequent encounter: Secondary | ICD-10-CM | POA: Diagnosis not present

## 2016-05-13 DIAGNOSIS — N182 Chronic kidney disease, stage 2 (mild): Secondary | ICD-10-CM | POA: Diagnosis not present

## 2016-05-13 DIAGNOSIS — T798XXD Other early complications of trauma, subsequent encounter: Secondary | ICD-10-CM | POA: Diagnosis not present

## 2016-05-16 ENCOUNTER — Encounter: Payer: Medicare Other | Admitting: Surgery

## 2016-05-16 DIAGNOSIS — L97222 Non-pressure chronic ulcer of left calf with fat layer exposed: Secondary | ICD-10-CM | POA: Diagnosis not present

## 2016-05-16 DIAGNOSIS — N189 Chronic kidney disease, unspecified: Secondary | ICD-10-CM | POA: Diagnosis not present

## 2016-05-16 DIAGNOSIS — I890 Lymphedema, not elsewhere classified: Secondary | ICD-10-CM | POA: Diagnosis not present

## 2016-05-16 DIAGNOSIS — E11622 Type 2 diabetes mellitus with other skin ulcer: Secondary | ICD-10-CM | POA: Diagnosis not present

## 2016-05-16 DIAGNOSIS — J449 Chronic obstructive pulmonary disease, unspecified: Secondary | ICD-10-CM | POA: Diagnosis not present

## 2016-05-16 DIAGNOSIS — L97221 Non-pressure chronic ulcer of left calf limited to breakdown of skin: Secondary | ICD-10-CM | POA: Diagnosis not present

## 2016-05-16 DIAGNOSIS — E1122 Type 2 diabetes mellitus with diabetic chronic kidney disease: Secondary | ICD-10-CM | POA: Diagnosis not present

## 2016-05-17 NOTE — Progress Notes (Signed)
[name] ([id number]) Visit Report for 05/16/2016 Arrival Information Details Patient Name: [name], [name]. Date of Service: 05/16/2016 1:15 PM Medical Record Number: [id number] Patient Account Number: [id number] Date of Birth/Sex: January 01, 1941 (75 y.o. Female) Treating Mcdaniel: [name] Primary Care Physician: [name] Other Clinician: Referring Physician: [place] Mcdaniel Treating Physician/Extender: [name] in Treatment: 15 Visit Information History Since Last Visit Added or deleted any medications: No Patient Arrived: Walker Any new allergies or adverse reactions: No Arrival Time: 13:39 Had a fall or experienced change in No Accompanied By: self activities of daily living that may affect Transfer Assistance: None risk of falls: Patient Identification Verified: Yes Signs or symptoms of abuse/neglect since last No Secondary Verification Process Completed: Yes visito Patient Has Alerts: Yes Hospitalized since last visit: No Patient Alerts: DM II Has Dressing in Place as Prescribed: Yes Pain Present Now: No Electronic Signature(s) Signed: 05/16/2016 5:31:46 PM By: [name] Entered By: [name], Mcdaniel, Joan on 05/16/2016 13:39:31 [name] ([id number]) -------------------------------------------------------------------------------- Clinic Level of Care Assessment Details Patient Name: [name] Date of Service: 05/16/2016 1:15 PM Medical Record Number: [id number] Patient Account Number: [id number] Date of Birth/Sex: [birth year] (75 y.o. Female) Treating Mcdaniel: [name] Primary Care Physician: [name] Other Clinician: Referring Physician: [place] Mcdaniel Treating Physician/Extender: [name] in Treatment: 15 Clinic Level of Care Assessment Items TOOL 4 Quantity Score _0  - Use when only an EandM is performed on FOLLOW-UP visit 0 ASSESSMENTS - Nursing Assessment / Reassessment _1  - Reassessment of  Co-morbidities (includes updates in patient status) 0 X - Reassessment of Adherence to Treatment Plan 1 5 ASSESSMENTS - Wound and Skin Assessment / Reassessment X - Simple Wound Assessment / Reassessment - one wound 1 5 _2  - Complex Wound Assessment / Reassessment - multiple wounds 0 _3  - Dermatologic / Skin Assessment (not related to wound area) 0 ASSESSMENTS - Focused Assessment _4  - Circumferential Edema Measurements - multi extremities 0 _5  - Nutritional Assessment / Counseling / Intervention 0 _6  - Lower Extremity Assessment (monofilament, tuning fork, pulses) 0 _7  - Peripheral Arterial Disease Assessment (using hand held doppler) 0 ASSESSMENTS - Ostomy and/or Continence Assessment and Care _8  - Incontinence Assessment and Management 0 _9  - Ostomy Care Assessment and Management (repouching, etc.) 0 PROCESS - Coordination of Care X - Simple Patient / Family Education for ongoing care 1 15 _10  - Complex (extensive) Patient / Family Education for ongoing care 0 X - Staff obtains Programmer, systems, Records, Test Results / Process Orders 1 10 _11  - Staff telephones HHA, Nursing Homes / Clarify orders / etc 0 _12  - Routine Transfer to another Facility (non-emergent condition) 0 [name] E. ([id number]) _13  - Routine Hospital Admission (non-emergent condition) 0 _14  - New Admissions / Biomedical engineer / Ordering NPWT, Apligraf, etc. 0 _15  - Emergency Hospital Admission (emergent condition) 0 X - Simple Discharge Coordination 1 10 _16  - Complex (extensive) Discharge Coordination 0 PROCESS - Special Needs _17  - Pediatric / Minor Patient Management 0 _18  - Isolation Patient Management 0 _19  - Hearing / Language / Visual special needs 0 _20  - Assessment of Community assistance (transportation, D/C planning, etc.) 0 _21  - Additional assistance / Altered mentation 0 _22  - Support Surface(s) Assessment (bed, cushion, seat, etc.) 0 INTERVENTIONS - Wound Cleansing / Measurement X - Simple Wound  Cleansing - one wound 1 5 _23  - Complex Wound Cleansing - multiple wounds 0 X - Wound Imaging (photographs - any number of wounds)  1 5 _0  - Wound Tracing (instead of photographs) 0 X - Simple Wound Measurement - one wound 1 5 _1  - Complex Wound Measurement - multiple wounds 0 INTERVENTIONS - Wound Dressings X - Small Wound Dressing one or multiple wounds 1 10 _2  - Medium Wound Dressing one or multiple wounds 0 _3  - Large Wound Dressing one or multiple wounds 0 <[id number]>_1<\/HYQMVHQIONGEXBMW>_4  - Application of Medications - topical 0 <XLKGMWNUUVOZDGUY>_4<\/IHKVQQVZDGLOVFIE>_3  - Application of Medications - injection 0 INTERVENTIONS - Miscellaneous _6  - External ear exam 0 Schild, Krystle E. ([id number]) _7  - Specimen Collection (cultures, biopsies, blood, body fluids, etc.) 0 _8  - Specimen(s) / Culture(s) sent or taken to Lab for analysis 0 _9  - Patient Transfer (multiple staff / Civil Service fast streamer / Similar devices) 0 _10  - Simple Staple / Suture removal (25 or less) 0 _11  - Complex Staple / Suture removal (26 or more) 0 _12  - Hypo / Hyperglycemic Management (close monitor of Blood Glucose) 0 _13  - Ankle / Brachial Index (ABI) - do not check if billed separately 0 X - Vital Signs 1 5 Has the patient been seen at the hospital within the last three years: Yes Total Score: 75 Level Of Care: New/Established - Level 2 Electronic Signature(s) Signed: 05/16/2016 5:31:46 PM By: [name] Entered By: [name], Mcdaniel, Joan on 05/16/2016 13:56:09 [name] ([id number]) -------------------------------------------------------------------------------- Encounter Discharge Information Details Patient Name: [name]. Date of Service: 05/16/2016 1:15 PM Medical Record Number: [id number] Patient Account Number: [id number] Date of Birth/Sex: [birth year] (75 y.o. Female) Treating Mcdaniel: [name] Primary Care Physician: [name] Other Clinician: Referring Physician: [place] Mcdaniel Treating Physician/Extender: [name] in Treatment:  15 Encounter Discharge Information Items Discharge Pain Level: 0 Discharge Condition: Stable Ambulatory Status: Walker Discharge Destination: Home Transportation: Private Auto Accompanied By: self Schedule Follow-up Appointment: Yes Medication Reconciliation completed and provided to Patient/Care Yes [place]: Provided on Clinical Summary of Care: 05/16/2016 Form Type Recipient Paper Patient DG Electronic Signature(s) Signed: 05/16/2016 2:06:02 PM By: Ruthine Dose Entered By: Ruthine Dose on 05/16/2016 14:06:01 [name] ([id number]) -------------------------------------------------------------------------------- Lower Extremity Assessment Details Patient Name: [name]. Date of Service: 05/16/2016 1:15 PM Medical Record Number: [id number] Patient Account Number: [id number] Date of Birth/Sex: 12-Jan-1941 (75 y.o. Female) Treating Mcdaniel: [name] Primary Care Physician: [name] Other Clinician: Referring Physician: [place] Mcdaniel Treating Physician/Extender: [name] in Treatment: 15 Edema Assessment Assessed: [Left: No] [Right: No] E[Left: dema] [Right: :] Calf Left: Right: Point of Measurement: 34 cm From Medial Instep 47.5 cm cm Ankle Left: Right: Point of Measurement: 11 cm From Medial Instep 29.8 cm cm Vascular Assessment Pulses: Dorsalis Pedis Palpable: [Left:Yes] Posterior Tibial Extremity colors, hair growth, and conditions: Extremity Color: [Left:Hyperpigmented] Hair Growth on Extremity: [Left:No] Temperature of Extremity: [Left:Warm] Capillary Refill: [Left:< 3 seconds] Dependent Rubor: [Left:No] Blanched when Elevated: [Left:No] Lipodermatosclerosis: [Left:No] Toe Nail Assessment Left: Right: Thick: Yes Discolored: No Deformed: No Improper Length and Hygiene: No Electronic Signature(s) Signed: 05/16/2016 5:31:46 PM By: [name] [address] [place] EMarland Kitchen ([id number]) Entered By: [name], Mcdaniel, Joan  on 05/16/2016 13:43:09 [name] ([id number]) -------------------------------------------------------------------------------- Multi Wound Chart Details Patient Name: [name]. Date of Service: 05/16/2016 1:15 PM Medical Record Number: [id number] Patient Account Number: [id number] Date of Birth/Sex: 1941/03/23 (75 y.o. 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 N/A N/A Comorbid History: Cataracts, Lymphedema, N/A N/A Hypertension, Type II Diabetes, Gout, Rheumatoid Arthritis, Osteoarthritis, Neuropathy Date Acquired: 12/27/2015 N/A N/A Weeks of Treatment: 15 N/A N/A Wound Status: Open N/A N/A Measurements L x W x D 1.2x0.9x0.1 N/A N/A (cm) Area (cm) : 0.848 N/A N/A Volume (cm) : 0.085 N/A N/A % Reduction in Area: 95.70% N/A N/A % Reduction in Volume: 97.90% N/A N/A Classification: Grade 2 N/A N/A Exudate Amount: Medium N/A N/A Exudate Type: Sanguinous N/A N/A Exudate Color: red N/A N/A Wound Margin: Distinct, outline attached N/A N/A Cartmell, Sidrah E. ([id number]) Granulation Amount: Large (67-100%) N/A N/A Granulation Quality: Red N/A N/A Necrotic Amount: Small (1-33%) N/A N/A Exposed Structures: Fascia: No N/A N/A Fat: No Tendon: No Muscle: No Joint: No Bone: No Limited to Skin Breakdown Epithelialization: Medium (34-66%) N/A N/A Periwound Skin Texture: Edema: Yes N/A N/A Scarring: Yes Excoriation: No Induration:  No Callus: No Crepitus: No Fluctuance: No Friable: No Rash: No Periwound Skin Moist: Yes N/A N/A Moisture: Maceration: No Dry/Scaly: No Periwound Skin Color: Ecchymosis: Yes N/A N/A Hemosiderin Staining: Yes Atrophie Blanche: No Cyanosis: No Erythema: No Mottled: No Pallor: No Rubor: No Temperature: No Abnormality N/A N/A Tenderness on Yes N/A N/A Palpation: Wound Preparation: Ulcer Cleansing: N/A N/A Rinsed/Irrigated with Saline Topical Anesthetic Applied: Other: lidocaine 4% Treatment Notes Wound #1 (Left, Lateral Lower Leg) 1. Cleansed with: Clean wound with Normal Saline 2. Anesthetic Topical Lidocaine 4% cream to wound bed prior to debridement Donelan, Lakeishia E. ([id number]) 4. Dressing Applied: Aquacel Ag 5. Secondary [place] 7. Secured with Patient to wear own compression stockings Electronic Signature(s) Signed: 05/16/2016 2:05:21 PM By: Christin Fudge MD, FACS Entered By: Christin Fudge on 05/16/2016 14:05:20 [name] ([id number]) -------------------------------------------------------------------------------- [place] Details Patient Name: [name] Date of Service: 05/16/2016 1:15 PM Medical Record Number: [id number] Patient Account Number: [id number] Date of Birth/Sex: 1941-04-03 (75 y.o. Female) Treating Mcdaniel: [name] Primary Care Physician: [name] Other Clinician: Referring Physician: [place] Mcdaniel Treating Physician/Extender: [name] in Treatment: 15 Active Inactive Abuse / Safety / Falls / Self Care Management Nursing Diagnoses: Impaired physical mobility Potential for falls Goals: Patient will remain injury free Date Initiated: 02/01/2016 Goal Status: Active Interventions: Assess fall risk on admission and as needed Notes: Orientation to the Wound Care Program Nursing Diagnoses: Knowledge deficit related to the wound healing center  program Goals: Patient/caregiver will verbalize understanding of the [facility] Program Date Initiated: 02/01/2016 Goal Status: Active Interventions: Provide education on orientation to the wound center Notes: Wound/Skin Impairment Nursing Diagnoses: Impaired tissue integrity Goals: Patient/caregiver will verbalize understanding of skin care regimen KEARY, HANAK ([id number]) Date Initiated: 02/01/2016 Goal Status: Active Ulcer/skin breakdown will have a volume reduction of 30% by week 4 Date Initiated: 02/01/2016 Goal Status: Active Ulcer/skin breakdown will have a volume reduction of 50% by week 8 Date Initiated: 02/01/2016 Goal Status: Active Ulcer/skin breakdown will have a volume reduction of 80% by week 12 Date Initiated: 02/01/2016 Goal Status: Active Ulcer/skin breakdown will heal within 14 weeks Date Initiated: 02/01/2016 Goal Status: Active Interventions: Assess patient/caregiver ability to obtain necessary supplies Assess patient/caregiver ability to perform ulcer/skin care regimen upon admission and as needed Assess  ulceration(s) every visit Notes: Electronic Signature(s) Signed: 05/16/2016 5:31:46 PM By: [name] Entered By: [name], Mcdaniel, Joan on 05/16/2016 13:53:31 [name] ([id number]) -------------------------------------------------------------------------------- Pain Assessment Details Patient Name: SECILY, WALTHOUR. Date of Service: 05/16/2016 1:15 PM Medical Record Number: [id number] Patient Account Number: [id number] Date of Birth/Sex: [birth year] (75 y.o. Female) Treating Mcdaniel: [name] Primary Care Physician: [name] Other Clinician: Referring Physician: [place] Mcdaniel Treating Physician/Extender: [name] in Treatment: 15 Active Problems Location of Pain Severity and Description of Pain Patient Has Paino No Site Locations With Dressing Change: No Pain Management and  Medication Current Pain Management: Electronic Signature(s) Signed: 05/16/2016 5:31:46 PM By: [name] Entered By: [name], Mcdaniel, Joan on 05/16/2016 13:39:52 [name] ([id number]) -------------------------------------------------------------------------------- Patient/Caregiver Education Details Patient Name: [name] Date of Service: 05/16/2016 1:15 PM Medical Record Number: [id number] Patient Account Number: [id number] Date of Birth/Gender: [birth year] (75 y.o. Female) Treating Mcdaniel: [name] Primary Care Physician: [name] Other Clinician: Referring Physician: [place] Mcdaniel Treating Physician/Extender: [name] in Treatment: 15 Education Assessment Education Provided To: Patient Education Topics Provided Venous: Handouts: Controlling Swelling with Compression Stockings Methods: Demonstration, Explain/Verbal Responses: State content correctly Wound/Skin Impairment: Handouts: Caring for Your Ulcer Methods: Demonstration, Explain/Verbal Responses: State content correctly Electronic Signature(s) Signed: 05/16/2016 5:31:46 PM By: [name] Entered By: [name], Mcdaniel, Joan on 05/16/2016 14:07:10 [name] ([id number]) -------------------------------------------------------------------------------- Wound Assessment Details Patient Name: [name]. Date of Service: 05/16/2016 1:15 PM Medical Record Number: [id number] Patient Account Number: [id number] Date of Birth/Sex: 1940/09/03 (75 y.o. Female) Treating Mcdaniel: [name] Primary Care Physician: [name] Other Clinician: Referring Physician: [place] Mcdaniel Treating Physician/Extender: [name] in Treatment: 15 Wound Status Wound Number: 1 Primary Diabetic Wound/Ulcer of the Lower Etiology: Extremity Wound Location: Left Lower Leg - Lateral Secondary Auto-immune Wounding Event: Trauma Etiology: Date Acquired:  12/27/2015 Wound Open Weeks Of Treatment: 15 Status: Clustered Wound: No Comorbid Cataracts, Lymphedema, History: Hypertension, Type II Diabetes, Gout, Rheumatoid Arthritis, Osteoarthritis, Neuropathy Photos Wound Measurements Length: (cm) 1.2 Width: (cm) 0.9 Depth: (cm) 0.1 Area: (cm) 0.848 Volume: (cm) 0.085 % Reduction in Area: 95.7% % Reduction in Volume: 97.9% Epithelialization: Medium (34-66%) Tunneling: No Undermining: No Wound Description Classification: Grade 2 Wound Margin: Distinct, outline attached Exudate Amount: Medium Exudate Type: Sanguinous Exudate Color: red Wound Bed Granulation Amount: Large (67-100%) Exposed Structure Granulation Quality: Red Fascia Exposed: No Necrotic Amount: Small (1-33%) Fat Layer Exposed: No Necrotic Quality: Adherent Slough Tendon Exposed: No Gherardi, Marshawn E. ([id number]) Muscle Exposed: No Joint Exposed: No Bone Exposed: No Limited to Skin Breakdown Periwound Skin Texture Texture Color No Abnormalities Noted: No No Abnormalities Noted: No Callus: No Atrophie Blanche: No Crepitus: No Cyanosis: No Excoriation: No Ecchymosis: Yes Fluctuance: No Erythema: No Friable: No Hemosiderin Staining: Yes Induration: No Mottled: No Localized Edema: Yes Pallor: No Rash: No Rubor: No Scarring: Yes Temperature / Pain Moisture Temperature: No Abnormality No Abnormalities Noted: No Tenderness on Palpation: Yes Dry / Scaly: No Maceration: No Moist: Yes Wound Preparation Ulcer Cleansing: Rinsed/Irrigated with Saline Topical Anesthetic Applied: Other: lidocaine 4%, Treatment Notes Wound #1 (Left, Lateral Lower Leg) 1. Cleansed with: Clean wound with Normal Saline 2. Anesthetic Topical Lidocaine 4% cream to wound bed prior to debridement 4. Dressing Applied: Aquacel Ag 5. Secondary [place] 7. Secured with Patient to wear own compression stockings Electronic Signature(s) Signed:  05/16/2016 5:31:46 PM  By: [name] Entered By: [name], Mcdaniel, Joan on 05/16/2016 13:45:44 [name] ([id number]) -------------------------------------------------------------------------------- [place] Details Patient Name: [name] Date of Service: 05/16/2016 1:15 PM Medical Record Number: [id number] Patient Account Number: [id number] Date of Birth/Sex: [birth year] (75 y.o. Female) Treating Mcdaniel: [name] Primary Care Physician: [name] Other Clinician: Referring Physician: [place] Mcdaniel Treating Physician/Extender: [name] in Treatment: 15 Vital Signs Time Taken: 13:39 Temperature (F): 97.8 Height (in): 64 Pulse (bpm): 101 Weight (lbs): 283 Respiratory Rate (breaths/min): 16 Body Mass Index (BMI): 48.6 Blood Pressure (mmHg): 131/67 Reference Range: 80 - 120 mg / dl Electronic Signature(s) Signed: 05/16/2016 5:31:46 PM By: [name] Entered By: [name], Mcdaniel, Joan on 05/16/2016 13:40:17

## 2016-05-17 NOTE — Progress Notes (Signed)
MAIZE, BRITTINGHAM ([id number]) Visit Report for 05/16/2016 Chief Complaint Document Details Patient Name: [name], [name] 05/16/2016 1:15 Date of Service: PM Medical Record [id number] Number: Patient Account Number: [id number] Feb 03, 1941 (75 y.o. Treating RN: Cornell Barman Date of Birth/Sex: Female) Other Clinician: Primary Care Physician: Glendale Chard Treating Christin Fudge Referring Physician: Glendale Chard Physician/Extender: Suella Grove in Treatment: 15 Information Obtained from: Patient Chief Complaint Ms. Fontenot presents for evaluation of her left leg ulcer Electronic Signature(s) Signed: 05/16/2016 2:05:27 PM By: Christin Fudge MD, FACS Entered By: Christin Fudge on 05/16/2016 14:05:27 [name] ([id number]) -------------------------------------------------------------------------------- HPI Details Patient Name: [name], [name] 05/16/2016 1:15 Date of Service: PM Medical Record [id number] Number: Patient Account Number: [id number] 1940/07/21 (75 y.o. Treating RN: Cornell Barman Date of Birth/Sex: Female) Other Clinician: Primary Care Physician: Glendale Chard Treating Jaimes Eckert Referring Physician: Glendale Chard Physician/Extender: Suella Grove in Treatment: 15 History of Present Illness Location: left lateral lower extremity Quality: denies pain Severity: denies pain Duration: ulcer has been present for several months Timing: denies pain Context: traumatic injury HPI Description: 75 year old patient with a past medical history of diabetes mellitus, chronic kidney disease, COPD, hypertension, rheumatoid arthritis had a lacerated wound to her left calf when she injured it on the corner of a car door and it was a long 12 cm laceration. x-ray done at the time revealed that there was no fracture or radiopaque foreign body. the wound was sutured with 4-0 Prolene simple interrupted sutures and a total of 14 sutures were applied. on August 31 she was seen in the  ER and it was noted that the wound had dehisced and she was treated with Keflex. She has completed a course of doxycycline recently Medical history also significant for status post abdominal hysterectomy, brain surgery, cardiac catheterization, EGD and bronchoscopy. of late she was back in the ER and was seen last on August 31 and they tried Silvadene 02/22/16: pt reports discomfort and mild pain just below her wound. however, she admits on legs more than usual. no redness, foul odor or purulence. the wound is measuring slightly larger today, but likely secondary to poor edema control. nursing staff gave information to pt today regarding purchase of her compression stockings. she was encouraged to purchase soon. 02/29/16: returns today for f/u. she denies systemic s/s of infection. she brought her new compression stockings today. 03/21/2016 -- She has been put on an antibiotic and prednisone for a bronchitis by her pulmonologist. 04/04/2016 -- she continues to be on prednisone. 04-18-2016 Ms. Willaims is for evaluation of her left lateral leg ulcer. She states that she is still on a titrating dose of prednisone. She also states that she recently received a steroid injection to her left knee. She states that her blood sugars have elevated slightly with the max reading in the 130s. She denies any pain or discomfort to her left leg ulcer. She states that she has been unable to wear her compression stockings and as a result has ankle increased edema to the left leg. 05/08/2016 - Ms. Blunck returns today for evaluation of her left lateral leg ulcer. She states that she has not been able to wear her compression hose due to her arthritis and pain in her hands. She denies any issues with her wound. [name], [name] ([id number]) 05/16/2016 -- venous duplex studies which I had ordered have not been done yet and I have asked her to get in touch with the office Electronic Signature(s) Signed: 05/16/2016  2:05:49 PM By: Christin Fudge  MD, FACS Entered By: Christin Fudge on 05/16/2016 14:05:48 [name] ([id number]) -------------------------------------------------------------------------------- Physical Exam Details Patient Name: [name], [name] 05/16/2016 1:15 Date of Service: PM Medical Record [id number] Number: Patient Account Number: [id number] March 01, 1941 (75 y.o. Treating RN: Cornell Barman Date of Birth/Sex: Female) Other Clinician: Primary Care Physician: Glendale Chard Treating Christin Fudge Referring Physician: Glendale Chard Physician/Extender: Weeks in Treatment: 15 Constitutional . Pulse regular. Respirations normal and unlabored. Afebrile. . Eyes Nonicteric. Reactive to light. Ears, Nose, Mouth, and Throat Lips, teeth, and gums WNL.[name] Moist mucosa without lesions. Neck supple and nontender. No palpable supraclavicular or cervical adenopathy. Normal sized without goiter. Respiratory WNL. No retractions.. Breath sounds WNL, No rubs, rales, rhonchi, or wheeze.. Cardiovascular Heart rhythm and rate regular, no murmur or Mcdaniel.. Pedal Pulses WNL. No clubbing, cyanosis or edema. Chest Breasts symmetical and no nipple discharge.. Breast tissue WNL, no masses, lumps, or tenderness.. Lymphatic No adneopathy. No adenopathy. No adenopathy. Musculoskeletal Adexa without tenderness or enlargement.. Digits and nails w/o clubbing, cyanosis, infection, petechiae, ischemia, or inflammatory conditions.. Integumentary (Hair, Skin) No suspicious lesions. No crepitus or fluctuance. No peri-wound warmth or erythema. No masses.[name] Psychiatric Judgement and insight Intact.. No evidence of depression, anxiety, or agitation.. Notes the wound overall looks much better and we did not need to do any sharp debridement Electronic Signature(s) Signed: 05/16/2016 2:06:04 PM By: Christin Fudge MD, FACS Entered By: Christin Fudge on 05/16/2016 14:06:04 [name]  ([id number]) -------------------------------------------------------------------------------- Physician Orders Details Patient Name: [name], [name] 05/16/2016 1:15 Date of Service: PM Medical Record [id number] Number: Patient Account Number: [id number] [birth year] (75 y.o. Treating RN: Cornell Barman Date of Birth/Sex: Female) Other Clinician: Primary Care Physician: Glendale Chard Treating Dannie Hattabaugh Referring Physician: Glendale Chard Physician/Extender: Suella Grove in Treatment: 15 Verbal / Phone Orders: Yes Clinician: Cornell Barman Read Back and Verified: Yes Diagnosis Coding Wound Cleansing Wound #1 Left,Lateral Lower Leg o Clean wound with Normal Saline. o May Shower, gently pat wound dry prior to applying new dressing. Anesthetic Wound #1 Left,Lateral Lower Leg o Topical Lidocaine 4% cream applied to wound bed prior to debridement Primary Wound Dressing Wound #1 Left,Lateral Lower Leg o Hydrafera Blue Secondary Dressing Wound #1 Left,Lateral Lower Leg o Other - telfa island Dressing Change Frequency Wound #1 Left,Lateral Lower Leg o Three times weekly - Friday in Clinic Follow-up Appointments Wound #1 Left,Lateral Lower Leg o Return Appointment in 1 week. Edema Control Wound #1 Left,Lateral Lower Leg o Patient to wear own compression stockings Home Health Wound #1 Left,Lateral Lower Leg o [facility] Visits - [place] Nurse may visit PRN to address patientos wound care needs. [name], [name] ([id number]) o FACE TO FACE ENCOUNTER: MEDICARE and MEDICAID PATIENTS: I certify that this patient is under my care and that I had a face-to-face encounter that meets the physician face-to-face encounter requirements with this patient on this date. The encounter with the patient was in whole or in part for the following MEDICAL CONDITION: (primary reason for [place]) MEDICAL NECESSITY: I certify, that based on my findings,  NURSING services are a medically necessary home health service. HOME BOUND STATUS: I certify that my clinical findings support that this patient is homebound (i.e., Due to illness or injury, pt requires aid of supportive devices such as crutches, cane, wheelchairs, walkers, the use of special transportation or the assistance of another person to leave their place of residence. There is a normal inability to leave the home and doing so requires considerable and  taxing effort. Other absences are for medical reasons / religious services and are infrequent or of short duration when for other reasons). o If current dressing causes regression in wound condition, may D/C ordered dressing product/s and apply Normal Saline Moist Dressing daily until next [place] / Other MD appointment. [place] of regression in wound condition at [phone]. o Please direct any NON-WOUND related issues/requests for orders to patient's Primary Care Physician Services and Therapies o Venous Studies -Unilateral - left Electronic Signature(s) Signed: 05/16/2016 4:27:48 PM By: Christin Fudge MD, FACS Signed: 05/16/2016 5:31:46 PM By: Gretta Cool RN, BSN, Kim RN, BSN Entered By: Gretta Cool, RN, BSN, Kim on 05/16/2016 13:57:20 [name] ([id number]) -------------------------------------------------------------------------------- Problem List Details Patient Name: [name], [name] 05/16/2016 1:15 Date of Service: PM Medical Record [id number] Number: Patient Account Number: [id number] 14-Jul-1940 (75 y.o. Treating RN: Cornell Barman Date of Birth/Sex: Female) Other Clinician: Primary Care Physician: Glendale Chard Treating Christin Fudge Referring Physician: Glendale Chard Physician/Extender: Suella Grove in Treatment: 15 Active Problems ICD-10 Encounter Code Description Active Date Diagnosis E11.622 Type 2 diabetes mellitus with other skin ulcer 02/01/2016 Yes L97.222 Non-pressure  chronic ulcer of left calf with fat layer 02/01/2016 Yes exposed I89.0 Lymphedema, not elsewhere classified 02/01/2016 Yes Inactive Problems Resolved Problems Electronic Signature(s) Signed: 05/16/2016 2:05:16 PM By: Christin Fudge MD, FACS Entered By: Christin Fudge on 05/16/2016 14:05:16 [name] ([id number]) -------------------------------------------------------------------------------- Progress Note Details Patient Name: [name] 05/16/2016 1:15 Date of Service: PM Medical Record [id number] Number: Patient Account Number: [id number] 17-Jul-1940 (75 y.o. Treating RN: Cornell Barman Date of Birth/Sex: Female) Other Clinician: Primary Care Physician: Glendale Chard Treating Edinson Domeier Referring Physician: Glendale Chard Physician/Extender: Suella Grove in Treatment: 15 Subjective Chief Complaint Information obtained from Patient Ms. Malachi presents for evaluation of her left leg ulcer History of Present Illness (HPI) The following HPI elements were documented for the patient's wound: Location: left lateral lower extremity Quality: denies pain Severity: denies pain Duration: ulcer has been present for several months Timing: denies pain Context: traumatic injury 75 year old patient with a past medical history of diabetes mellitus, chronic kidney disease, COPD, hypertension, rheumatoid arthritis had a lacerated wound to her left calf when she injured it on the corner of a car door and it was a long 12 cm laceration. x-ray done at the time revealed that there was no fracture or radiopaque foreign body. the wound was sutured with 4-0 Prolene simple interrupted sutures and a total of 14 sutures were applied. on August 31 she was seen in the ER and it was noted that the wound had dehisced and she was treated with Keflex. She has completed a course of doxycycline recently Medical history also significant for status post abdominal hysterectomy, brain surgery,  cardiac catheterization, EGD and bronchoscopy. of late she was back in the ER and was seen last on August 31 and they tried Silvadene 02/22/16: pt reports discomfort and mild pain just below her wound. however, she admits on legs more than usual. no redness, foul odor or purulence. the wound is measuring slightly larger today, but likely secondary to poor edema control. nursing staff gave information to pt today regarding purchase of her compression stockings. she was encouraged to purchase soon. 02/29/16: returns today for f/u. she denies systemic s/s of infection. she brought her new compression stockings today. 03/21/2016 -- She has been put on an antibiotic and prednisone for a bronchitis by her pulmonologist. 04/04/2016 -- she continues to be on prednisone. 04-18-2016 [name]  is for evaluation of her left lateral leg ulcer. She states that she is still on a titrating dose of prednisone. She also states that she recently received a steroid injection to her left knee. She states that her blood sugars have elevated slightly with the max reading in the 130s. She denies any pain or Rosenberg, Angee E. ([id number]) discomfort to her left leg ulcer. She states that she has been unable to wear her compression stockings and as a result has ankle increased edema to the left leg. 05/08/2016 - Ms. Kanady returns today for evaluation of her left lateral leg ulcer. She states that she has not been able to wear her compression hose due to her arthritis and pain in her hands. She denies any issues with her wound. 05/16/2016 -- venous duplex studies which I had ordered have not been done yet and I have asked her to get in touch with the office Objective Constitutional Pulse regular. Respirations normal and unlabored. Afebrile. Vitals Time Taken: 1:39 PM, Height: 64 in, Weight: 283 lbs, BMI: 48.6, Temperature: 97.8 F, Pulse: 101 bpm, Respiratory Rate: 16 breaths/min, Blood Pressure: 131/67  mmHg. Eyes Nonicteric. Reactive to light. Ears, Nose, Mouth, and Throat Lips, teeth, and gums WNL.[name] Moist mucosa without lesions. Neck supple and nontender. No palpable supraclavicular or cervical adenopathy. Normal sized without goiter. Respiratory WNL. No retractions.. Breath sounds WNL, No rubs, rales, rhonchi, or wheeze.. Cardiovascular Heart rhythm and rate regular, no murmur or Mcdaniel.. Pedal Pulses WNL. No clubbing, cyanosis or edema. Chest Breasts symmetical and no nipple discharge.. Breast tissue WNL, no masses, lumps, or tenderness.. Lymphatic No adneopathy. No adenopathy. No adenopathy. Musculoskeletal Adexa without tenderness or enlargement.. Digits and nails w/o clubbing, cyanosis, infection, petechiae, ischemia, or inflammatory conditions.[name] Psychiatric [name], [name] ([id number]) Judgement and insight Intact.. No evidence of depression, anxiety, or agitation.. General Notes: the wound overall looks much better and we did not need to do any sharp debridement Integumentary (Hair, Skin) No suspicious lesions. No crepitus or fluctuance. No peri-wound warmth or erythema. No masses.. Wound #1 status is Open. Original cause of wound was Trauma. The wound is located on the Left,Lateral Lower Leg. The wound measures 1.2cm length x 0.9cm width x 0.1cm depth; 0.848cm^2 area and 0.085cm^3 volume. The wound is limited to skin breakdown. There is no tunneling or undermining noted. There is a medium amount of sanguinous drainage noted. The wound margin is distinct with the outline attached to the wound base. There is large (67-100%) red granulation within the wound bed. There is a small (1-33%) amount of necrotic tissue within the wound bed including Adherent Slough. The periwound skin appearance exhibited: Localized Edema, Scarring, Moist, Ecchymosis, Hemosiderin Staining. The periwound skin appearance did not exhibit: Callus, Crepitus, Excoriation, Fluctuance, Friable,  Induration, Rash, Dry/Scaly, Maceration, Atrophie Blanche, Cyanosis, Mottled, Pallor, Rubor, Erythema. Periwound temperature was noted as No Abnormality. The periwound has tenderness on palpation. Assessment Active Problems ICD-10 E11.622 - Type 2 diabetes mellitus with other skin ulcer L97.222 - Non-pressure chronic ulcer of left calf with fat layer exposed I89.0 - Lymphedema, not elsewhere classified Plan Wound Cleansing: Wound #1 Left,Lateral Lower Leg: Clean wound with Normal Saline. May Shower, gently pat wound dry prior to applying new dressing. Anesthetic: Wound #1 Left,Lateral Lower Leg: Topical Lidocaine 4% cream applied to wound bed prior to debridement Primary Wound Dressing: Wound #1 Left,Lateral Lower Leg: Hydrafera Blue Secondary Dressing: Wound #1 Left,Lateral Lower Leg: [name], [name] ([id number]) Other - telfa island Dressing Change Frequency: Wound #  1 Left,Lateral Lower Leg: Three times weekly - Friday in Clinic Follow-up Appointments: Wound #1 Left,Lateral Lower Leg: Return Appointment in 1 week. Edema Control: Wound #1 Left,Lateral Lower Leg: Patient to wear own compression stockings Home Health: Wound #1 Left,Lateral Lower Leg: Continue Home Health Visits - [facility] Nurse may visit PRN to address patient s wound care needs. FACE TO FACE ENCOUNTER: MEDICARE and MEDICAID PATIENTS: I certify that this patient is under my care and that I had a face-to-face encounter that meets the physician face-to-face encounter requirements with this patient on this date. The encounter with the patient was in whole or in part for the following MEDICAL CONDITION: (primary reason for [place]) MEDICAL NECESSITY: I certify, that based on my findings, NURSING services are a medically necessary home health service. HOME BOUND STATUS: I certify that my clinical findings support that this patient is homebound (i.e., Due to illness or injury, pt requires aid  of supportive devices such as crutches, cane, wheelchairs, walkers, the use of special transportation or the assistance of another person to leave their place of residence. There is a normal inability to leave the home and doing so requires considerable and taxing effort. Other absences are for medical reasons / religious services and are infrequent or of short duration when for other reasons). If current dressing causes regression in wound condition, may D/C ordered dressing product/s and apply Normal Saline Moist Dressing daily until next [place] / Other MD appointment. [place] of regression in wound condition at [phone]. Please direct any NON-WOUND related issues/requests for orders to patient's Primary Care Physician Services and Therapies ordered were: Venous Studies -Unilateral - left I have recommended we use Hydrofera Blue and we should continue her local compression stockings. I had ordered a formal venous reflux study and I have discussed this with her and she is agreeable about getting the test done. We will get this done at vein [facility] vascular at [facility] -- appointment still pending I have told her to aggressively elevate and exercise to reduce her lymphedema. Electronic Signature(s) Signed: 05/16/2016 2:07:25 PM By: Christin Fudge MD, FACS Entered By: Christin Fudge on 05/16/2016 14:07:25 [name] ([id number] Mcdaniel, [name] ([id number]) -------------------------------------------------------------------------------- SuperBill Details Patient Name: [name] Date of Service: 05/16/2016 Medical Record Number: [id number] Patient Account Number: [id number] Date of Birth/Sex: 06-Apr-1941 (75 y.o. Female) Treating RN: Cornell Barman Primary Care Physician: Glendale Chard Other Clinician: Referring Physician: Glendale Chard Treating Physician/Extender: Frann Rider in Treatment: 15 Diagnosis Coding ICD-10 Codes Code  Description E11.622 Type 2 diabetes mellitus with other skin ulcer L97.222 Non-pressure chronic ulcer of left calf with fat layer exposed I89.0 Lymphedema, not elsewhere classified Facility Procedures CPT4 Code: 29528413 Description: [id number] - WOUND CARE VISIT-LEV 2 EST PT Modifier: Quantity: 1 Physician Procedures CPT4 Code: [id number] Description: 64403 - WC PHYS LEVEL 3 - EST PT ICD-10 Description Diagnosis E11.622 Type 2 diabetes mellitus with other skin ulcer L97.222 Non-pressure chronic ulcer of left calf with fat I89.0 Lymphedema, not elsewhere classified Modifier: layer exposed Quantity: 1 Electronic Signature(s) Signed: 05/16/2016 2:07:46 PM By: Christin Fudge MD, FACS Entered By: Christin Fudge on 05/16/2016 14:07:46

## 2016-05-18 DIAGNOSIS — I5032 Chronic diastolic (congestive) heart failure: Secondary | ICD-10-CM | POA: Diagnosis not present

## 2016-05-18 DIAGNOSIS — T798XXD Other early complications of trauma, subsequent encounter: Secondary | ICD-10-CM | POA: Diagnosis not present

## 2016-05-18 DIAGNOSIS — S81812D Laceration without foreign body, left lower leg, subsequent encounter: Secondary | ICD-10-CM | POA: Diagnosis not present

## 2016-05-18 DIAGNOSIS — E1122 Type 2 diabetes mellitus with diabetic chronic kidney disease: Secondary | ICD-10-CM | POA: Diagnosis not present

## 2016-05-18 DIAGNOSIS — I130 Hypertensive heart and chronic kidney disease with heart failure and stage 1 through stage 4 chronic kidney disease, or unspecified chronic kidney disease: Secondary | ICD-10-CM | POA: Diagnosis not present

## 2016-05-18 DIAGNOSIS — N182 Chronic kidney disease, stage 2 (mild): Secondary | ICD-10-CM | POA: Diagnosis not present

## 2016-05-19 DIAGNOSIS — E1122 Type 2 diabetes mellitus with diabetic chronic kidney disease: Secondary | ICD-10-CM | POA: Diagnosis not present

## 2016-05-19 DIAGNOSIS — N182 Chronic kidney disease, stage 2 (mild): Secondary | ICD-10-CM | POA: Diagnosis not present

## 2016-05-19 DIAGNOSIS — I5032 Chronic diastolic (congestive) heart failure: Secondary | ICD-10-CM | POA: Diagnosis not present

## 2016-05-19 DIAGNOSIS — T798XXD Other early complications of trauma, subsequent encounter: Secondary | ICD-10-CM | POA: Diagnosis not present

## 2016-05-19 DIAGNOSIS — S81812D Laceration without foreign body, left lower leg, subsequent encounter: Secondary | ICD-10-CM | POA: Diagnosis not present

## 2016-05-19 DIAGNOSIS — I130 Hypertensive heart and chronic kidney disease with heart failure and stage 1 through stage 4 chronic kidney disease, or unspecified chronic kidney disease: Secondary | ICD-10-CM | POA: Diagnosis not present

## 2016-05-21 DIAGNOSIS — T798XXD Other early complications of trauma, subsequent encounter: Secondary | ICD-10-CM | POA: Diagnosis not present

## 2016-05-21 DIAGNOSIS — E1122 Type 2 diabetes mellitus with diabetic chronic kidney disease: Secondary | ICD-10-CM | POA: Diagnosis not present

## 2016-05-21 DIAGNOSIS — N182 Chronic kidney disease, stage 2 (mild): Secondary | ICD-10-CM | POA: Diagnosis not present

## 2016-05-21 DIAGNOSIS — I5032 Chronic diastolic (congestive) heart failure: Secondary | ICD-10-CM | POA: Diagnosis not present

## 2016-05-21 DIAGNOSIS — S81812D Laceration without foreign body, left lower leg, subsequent encounter: Secondary | ICD-10-CM | POA: Diagnosis not present

## 2016-05-21 DIAGNOSIS — I130 Hypertensive heart and chronic kidney disease with heart failure and stage 1 through stage 4 chronic kidney disease, or unspecified chronic kidney disease: Secondary | ICD-10-CM | POA: Diagnosis not present

## 2016-05-22 ENCOUNTER — Other Ambulatory Visit: Payer: Self-pay | Admitting: Surgery

## 2016-05-22 DIAGNOSIS — R600 Localized edema: Secondary | ICD-10-CM

## 2016-05-23 ENCOUNTER — Encounter: Payer: Medicare Other | Attending: Surgery | Admitting: Surgery

## 2016-05-23 ENCOUNTER — Ambulatory Visit (HOSPITAL_COMMUNITY)
Admission: RE | Admit: 2016-05-23 | Discharge: 2016-05-23 | Disposition: A | Discharge status: Home / Self Care | Payer: Medicare Other | Source: Ambulatory Visit | Attending: Vascular Surgery | Admitting: Vascular Surgery

## 2016-05-23 DIAGNOSIS — R600 Localized edema: Secondary | ICD-10-CM

## 2016-05-23 DIAGNOSIS — J449 Chronic obstructive pulmonary disease, unspecified: Secondary | ICD-10-CM | POA: Insufficient documentation

## 2016-05-23 DIAGNOSIS — E1140 Type 2 diabetes mellitus with diabetic neuropathy, unspecified: Secondary | ICD-10-CM | POA: Diagnosis not present

## 2016-05-23 DIAGNOSIS — L97222 Non-pressure chronic ulcer of left calf with fat layer exposed: Secondary | ICD-10-CM | POA: Diagnosis not present

## 2016-05-23 DIAGNOSIS — Z6841 Body Mass Index (BMI) 40.0 and over, adult: Secondary | ICD-10-CM | POA: Insufficient documentation

## 2016-05-23 DIAGNOSIS — E11622 Type 2 diabetes mellitus with other skin ulcer: Secondary | ICD-10-CM | POA: Diagnosis not present

## 2016-05-23 DIAGNOSIS — M069 Rheumatoid arthritis, unspecified: Secondary | ICD-10-CM | POA: Diagnosis not present

## 2016-05-23 DIAGNOSIS — N189 Chronic kidney disease, unspecified: Secondary | ICD-10-CM | POA: Insufficient documentation

## 2016-05-23 DIAGNOSIS — I129 Hypertensive chronic kidney disease with stage 1 through stage 4 chronic kidney disease, or unspecified chronic kidney disease: Secondary | ICD-10-CM | POA: Insufficient documentation

## 2016-05-23 DIAGNOSIS — E1122 Type 2 diabetes mellitus with diabetic chronic kidney disease: Secondary | ICD-10-CM | POA: Diagnosis not present

## 2016-05-23 DIAGNOSIS — Z79899 Other long term (current) drug therapy: Secondary | ICD-10-CM | POA: Diagnosis not present

## 2016-05-23 DIAGNOSIS — I872 Venous insufficiency (chronic) (peripheral): Secondary | ICD-10-CM | POA: Diagnosis not present

## 2016-05-23 DIAGNOSIS — L97221 Non-pressure chronic ulcer of left calf limited to breakdown of skin: Secondary | ICD-10-CM | POA: Diagnosis not present

## 2016-05-23 DIAGNOSIS — I890 Lymphedema, not elsewhere classified: Secondary | ICD-10-CM | POA: Insufficient documentation

## 2016-05-23 NOTE — Progress Notes (Addendum)
[name], [name] ([id number]) Visit Report for 05/23/2016 Chief Complaint Document Details Patient Name: [name], [name]. Date of Service: 05/23/2016 12:30 PM Medical Record Number: [id number] Patient Account Number: [id number] Date of Birth/Sex: Oct 19, 1940 (76 y.o. Female) Treating RN: Montey Hora Primary Care Physician: Glendale Chard Other Clinician: Referring Physician: Glendale Chard Treating Physician/Extender: Frann Rider in Treatment: 16 Information Obtained from: Patient Chief Complaint Ms. Steelman presents for evaluation of her left leg ulcer Electronic Signature(s) Signed: 05/23/2016 1:04:03 PM By: Christin Fudge MD, FACS Entered By: Christin Fudge on 05/23/2016 13:04:02 [name] ([id number]) -------------------------------------------------------------------------------- HPI Details Patient Name: [name]. Date of Service: 05/23/2016 12:30 PM Medical Record Number: [id number] Patient Account Number: [id number] Date of Birth/Sex: 1941-05-09 (76 y.o. Female) Treating RN: Montey Hora Primary Care Physician: Glendale Chard Other Clinician: Referring Physician: Glendale Chard Treating Physician/Extender: Frann Rider in Treatment: 16 History of Present Illness Location: left lateral lower extremity Quality: denies pain Severity: denies pain Duration: ulcer has been present for several months Timing: denies pain Context: traumatic injury HPI Description: 76 year old patient with a past medical history of diabetes mellitus, chronic kidney disease, COPD, hypertension, rheumatoid arthritis had a lacerated wound to her left calf when she injured it on the corner of a car door and it was a long 12 cm laceration. x-ray done at the time revealed that there was no fracture or radiopaque foreign body. the wound was sutured with 4-0 Prolene simple interrupted sutures and a total of 14 sutures were applied. on August 31 she was seen in the ER and  it was noted that the wound had dehisced and she was treated with Keflex. She has completed a course of doxycycline recently Medical history also significant for status post abdominal hysterectomy, brain surgery, cardiac catheterization, EGD and bronchoscopy. of late she was back in the ER and was seen last on August 31 and they tried Silvadene 02/22/16: pt reports discomfort and mild pain just below her wound. however, she admits on legs more than usual. no redness, foul odor or purulence. the wound is measuring slightly larger today, but likely secondary to poor edema control. nursing staff gave information to pt today regarding purchase of her compression stockings. she was encouraged to purchase soon. 02/29/16: returns today for f/u. she denies systemic s/s of infection. she brought her new compression stockings today. 03/21/2016 -- She has been put on an antibiotic and prednisone for a bronchitis by her pulmonologist. 04/04/2016 -- she continues to be on prednisone. 04-18-2016 Ms. Lender is for evaluation of her left lateral leg ulcer. She states that she is still on a titrating dose of prednisone. She also states that she recently received a steroid injection to her left knee. She states that her blood sugars have elevated slightly with the max reading in the 130s. She denies any pain or discomfort to her left leg ulcer. She states that she has been unable to wear her compression stockings and as a result has ankle increased edema to the left leg. 05/08/2016 - Ms. Musolino returns today for evaluation of her left lateral leg ulcer. She states that she has not been able to wear her compression hose due to her arthritis and pain in her hands. She denies any issues with her wound. 05/16/2016 -- venous duplex studies which I had ordered have not been done yet and I have asked her to get in touch with the office. 05/23/2016 -- venous duplex studies was done today and we will await the  report ZAKARIA, FROMER ([id number]) Electronic Signature(s) Signed: 05/23/2016 1:04:50 PM By: Christin Fudge MD, FACS Entered By: Christin Fudge on 05/23/2016 13:04:50 [name] ([id number]) -------------------------------------------------------------------------------- Physical Exam Details Patient Name: [name] Date of Service: 05/23/2016 12:30 PM Medical Record Number: [id number] Patient Account Number: [id number] Date of Birth/Sex: 1941-03-09 (76 y.o. Female) Treating RN: Montey Hora Primary Care Physician: Glendale Chard Other Clinician: Referring Physician: Glendale Chard Treating Physician/Extender: Frann Rider in Treatment: 16 Constitutional . Pulse regular. Respirations normal and unlabored. Afebrile. . Eyes Nonicteric. Reactive to light. Ears, Nose, Mouth, and Throat Lips, teeth, and gums WNL.[name] Moist mucosa without lesions. Neck supple and nontender. No palpable supraclavicular or cervical adenopathy. Normal sized without goiter. Respiratory WNL. No retractions.. Cardiovascular Pedal Pulses WNL. No clubbing, cyanosis or edema. Lymphatic No adneopathy. No adenopathy. No adenopathy. Musculoskeletal Adexa without tenderness or enlargement.. Digits and nails w/o clubbing, cyanosis, infection, petechiae, ischemia, or inflammatory conditions.. Integumentary (Hair, Skin) No suspicious lesions. No crepitus or fluctuance. No peri-wound warmth or erythema. No masses.[name] Psychiatric Judgement and insight Intact.. No evidence of depression, anxiety, or agitation.. Notes lymphedema is less but persists. Her ulceration is looking very good and there is no hyper granulation tissue Electronic Signature(s) Signed: 05/23/2016 1:06:06 PM By: Christin Fudge MD, FACS Entered By: Christin Fudge on 05/23/2016 13:06:05 [name] ([id number]) -------------------------------------------------------------------------------- Physician Orders  Details Patient Name: [name]. Date of Service: 05/23/2016 12:30 PM Medical Record Number: [id number] Patient Account Number: [id number] Date of Birth/Sex: 07-Jan-1941 (75 y.o. Female) Treating RN: Montey Hora Primary Care Physician: Glendale Chard Other Clinician: Referring Physician: Glendale Chard Treating Physician/Extender: Frann Rider in Treatment: 53 Verbal / Phone Orders: Yes Clinician: Montey Hora Read Back and Verified: Yes Diagnosis Coding ICD-10 Coding Code Description E11.622 Type 2 diabetes mellitus with other skin ulcer L97.222 Non-pressure chronic ulcer of left calf with fat layer exposed I89.0 Lymphedema, not elsewhere classified Wound Cleansing Wound #1 Left,Lateral Lower Leg o Clean wound with Normal Saline. o May Shower, gently pat wound dry prior to applying new dressing. Anesthetic Wound #1 Left,Lateral Lower Leg o Topical Lidocaine 4% cream applied to wound bed prior to debridement Primary Wound Dressing Wound #1 Left,Lateral Lower Leg o Hydrafera Blue Secondary Dressing Wound #1 Left,Lateral Lower Leg o Other - telfa island Dressing Change Frequency Wound #1 Left,Lateral Lower Leg o Three times weekly - Friday in Clinic Follow-up Appointments Wound #1 Left,Lateral Lower Leg o Return Appointment in 1 week. Edema Control Wound #1 Left,Lateral Lower Leg o Patient to wear own compression stockings ELOUISE, DIVELBISS ([id number]) Home Health Wound #1 Left,Lateral Lower Leg o Colon Visits - [place] Nurse may visit PRN to address patientos wound care needs. o FACE TO FACE ENCOUNTER: MEDICARE and MEDICAID PATIENTS: I certify that this patient is under my care and that I had a face-to-face encounter that meets the physician face-to-face encounter requirements with this patient on this date. The encounter with the patient was in whole or in part for the following MEDICAL CONDITION:  (primary reason for [place]) MEDICAL NECESSITY: I certify, that based on my findings, NURSING services are a medically necessary home health service. HOME BOUND STATUS: I certify that my clinical findings support that this patient is homebound (i.e., Due to illness or injury, pt requires aid of supportive devices such as crutches, cane, wheelchairs, walkers, the use of special transportation or the assistance of another person to leave their place of residence. There is a  normal inability to leave the home and doing so requires considerable and taxing effort. Other absences are for medical reasons / religious services and are infrequent or of short duration when for other reasons). o If current dressing causes regression in wound condition, may D/C ordered dressing product/s and apply Normal Saline Moist Dressing daily until next [place] / Other MD appointment. [place] of regression in wound condition at [phone]. o Please direct any NON-WOUND related issues/requests for orders to patient's Primary Care Physician Electronic Signature(s) Signed: 05/23/2016 4:52:44 PM By: Christin Fudge MD, FACS Signed: 05/23/2016 5:21:18 PM By: Montey Hora Entered By: Montey Hora on 05/23/2016 13:05:37 [name] ([id number]) -------------------------------------------------------------------------------- Problem List Details Patient Name: VOLLIE, AARON. Date of Service: 05/23/2016 12:30 PM Medical Record Number: [id number] Patient Account Number: [id number] Date of Birth/Sex: Sep 21, 1940 (76 y.o. Female) Treating RN: Montey Hora Primary Care Physician: Glendale Chard Other Clinician: Referring Physician: Glendale Chard Treating Physician/Extender: Frann Rider in Treatment: 16 Active Problems ICD-10 Encounter Code Description Active Date Diagnosis E11.622 Type 2 diabetes mellitus with other skin ulcer 02/01/2016 Yes L97.222  Non-pressure chronic ulcer of left calf with fat layer 02/01/2016 Yes exposed I89.0 Lymphedema, not elsewhere classified 02/01/2016 Yes Inactive Problems Resolved Problems Electronic Signature(s) Signed: 05/23/2016 1:03:47 PM By: Christin Fudge MD, FACS Entered By: Christin Fudge on 05/23/2016 13:03:47 [name] ([id number]) -------------------------------------------------------------------------------- Progress Note Details Patient Name: [name]. Date of Service: 05/23/2016 12:30 PM Medical Record Number: [id number] Patient Account Number: [id number] Date of Birth/Sex: [birth year] (76 y.o. Female) Treating RN: Montey Hora Primary Care Physician: Glendale Chard Other Clinician: Referring Physician: Glendale Chard Treating Physician/Extender: Frann Rider in Treatment: 16 Subjective Chief Complaint Information obtained from Patient Ms. Pincock presents for evaluation of her left leg ulcer History of Present Illness (HPI) The following HPI elements were documented for the patient's wound: Location: left lateral lower extremity Quality: denies pain Severity: denies pain Duration: ulcer has been present for several months Timing: denies pain Context: traumatic injury 76 year old patient with a past medical history of diabetes mellitus, chronic kidney disease, COPD, hypertension, rheumatoid arthritis had a lacerated wound to her left calf when she injured it on the corner of a car door and it was a long 12 cm laceration. x-ray done at the time revealed that there was no fracture or radiopaque foreign body. the wound was sutured with 4-0 Prolene simple interrupted sutures and a total of 14 sutures were applied. on August 31 she was seen in the ER and it was noted that the wound had dehisced and she was treated with Keflex. She has completed a course of doxycycline recently Medical history also significant for status post abdominal hysterectomy, brain surgery,  cardiac catheterization, EGD and bronchoscopy. of late she was back in the ER and was seen last on August 31 and they tried Silvadene 02/22/16: pt reports discomfort and mild pain just below her wound. however, she admits on legs more than usual. no redness, foul odor or purulence. the wound is measuring slightly larger today, but likely secondary to poor edema control. nursing staff gave information to pt today regarding purchase of her compression stockings. she was encouraged to purchase soon. 02/29/16: returns today for f/u. she denies systemic s/s of infection. she brought her new compression stockings today. 03/21/2016 -- She has been put on an antibiotic and prednisone for a bronchitis by her pulmonologist. 04/04/2016 -- she continues to be on prednisone. 04-18-2016 Ms. Abbey is for evaluation  of her left lateral leg ulcer. She states that she is still on a titrating dose of prednisone. She also states that she recently received a steroid injection to her left knee. She states that her blood sugars have elevated slightly with the max reading in the 130s. She denies any pain or discomfort to her left leg ulcer. She states that she has been unable to wear her compression stockings and as a result has ankle increased edema to the left leg. SALIHA, SALTS ([id number]) 05/08/2016 - Ms. Battaglini returns today for evaluation of her left lateral leg ulcer. She states that she has not been able to wear her compression hose due to her arthritis and pain in her hands. She denies any issues with her wound. 05/16/2016 -- venous duplex studies which I had ordered have not been done yet and I have asked her to get in touch with the office. 05/23/2016 -- venous duplex studies was done today and we will await the report Objective Constitutional Pulse regular. Respirations normal and unlabored. Afebrile. Vitals Time Taken: 12:49 PM, Height: 64 in, Weight: 283 lbs, BMI: 48.6, Temperature: 97.9 F,  Pulse: 97 bpm, Respiratory Rate: 16 breaths/min, Blood Pressure: 133/78 mmHg. Eyes Nonicteric. Reactive to light. Ears, Nose, Mouth, and Throat Lips, teeth, and gums WNL.[name] Moist mucosa without lesions. Neck supple and nontender. No palpable supraclavicular or cervical adenopathy. Normal sized without goiter. Respiratory WNL. No retractions.. Cardiovascular Pedal Pulses WNL. No clubbing, cyanosis or edema. Lymphatic No adneopathy. No adenopathy. No adenopathy. Musculoskeletal Adexa without tenderness or enlargement.. Digits and nails w/o clubbing, cyanosis, infection, petechiae, ischemia, or inflammatory conditions.[name] Psychiatric Judgement and insight Intact.. No evidence of depression, anxiety, or agitation.. General Notes: lymphedema is less but persists. Her ulceration is looking very good and there is no hyper Carmer, Amreen E. ([id number]) granulation tissue Integumentary (Hair, Skin) No suspicious lesions. No crepitus or fluctuance. No peri-wound warmth or erythema. No masses.. Wound #1 status is Open. Original cause of wound was Trauma. The wound is located on the Left,Lateral Lower Leg. The wound measures 0.9cm length x 0.7cm width x 0.1cm depth; 0.495cm^2 area and 0.049cm^3 volume. The wound is limited to skin breakdown. There is no tunneling or undermining noted. There is a medium amount of sanguinous drainage noted. The wound margin is distinct with the outline attached to the wound base. There is large (67-100%) red granulation within the wound bed. There is a small (1-33%) amount of necrotic tissue within the wound bed including Adherent Slough. The periwound skin appearance exhibited: Localized Edema, Scarring, Moist, Ecchymosis, Hemosiderin Staining. The periwound skin appearance did not exhibit: Callus, Crepitus, Excoriation, Fluctuance, Friable, Induration, Rash, Dry/Scaly, Maceration, Atrophie Blanche, Cyanosis, Mottled, Pallor, Rubor, Erythema. Periwound temperature  was noted as No Abnormality. The periwound has tenderness on palpation. Assessment Active Problems ICD-10 E11.622 - Type 2 diabetes mellitus with other skin ulcer L97.222 - Non-pressure chronic ulcer of left calf with fat layer exposed I89.0 - Lymphedema, not elsewhere classified Plan Wound Cleansing: Wound #1 Left,Lateral Lower Leg: Clean wound with Normal Saline. May Shower, gently pat wound dry prior to applying new dressing. Anesthetic: Wound #1 Left,Lateral Lower Leg: Topical Lidocaine 4% cream applied to wound bed prior to debridement Primary Wound Dressing: Wound #1 Left,Lateral Lower Leg: Hydrafera Blue Secondary Dressing: Wound #1 Left,Lateral Lower Leg: Other - telfa island Dressing Change Frequency: Wound #1 Left,Lateral Lower LegKIMBRA, MARCELINO ([id number]) Three times weekly - Friday in Clinic Follow-up Appointments: Wound #1 Left,Lateral Lower Leg: Return Appointment  in 1 week. Edema Control: Wound #1 Left,Lateral Lower Leg: Patient to wear own compression stockings Home Health: Wound #1 Left,Lateral Lower Leg: Continue Home Health Visits - [facility] Nurse may visit PRN to address patient s wound care needs. FACE TO FACE ENCOUNTER: MEDICARE and MEDICAID PATIENTS: I certify that this patient is under my care and that I had a face-to-face encounter that meets the physician face-to-face encounter requirements with this patient on this date. The encounter with the patient was in whole or in part for the following MEDICAL CONDITION: (primary reason for [place]) MEDICAL NECESSITY: I certify, that based on my findings, NURSING services are a medically necessary home health service. HOME BOUND STATUS: I certify that my clinical findings support that this patient is homebound (i.e., Due to illness or injury, pt requires aid of supportive devices such as crutches, cane, wheelchairs, walkers, the use of special transportation or the assistance of  another person to leave their place of residence. There is a normal inability to leave the home and doing so requires considerable and taxing effort. Other absences are for medical reasons / religious services and are infrequent or of short duration when for other reasons). If current dressing causes regression in wound condition, may D/C ordered dressing product/s and apply Normal Saline Moist Dressing daily until next [place] / Other MD appointment. [place] of regression in wound condition at [phone]. Please direct any NON-WOUND related issues/requests for orders to patient's Primary Care Physician I have recommended we use Hydrofera Blue and we should continue her local compression stockings. she does not improve by next week we may need to go back to wrapping her legs with compression wraps I had ordered a formal venous reflux study and this was done today at vein and vascular at [facility] -- report is still pending I have told her to aggressively elevate and exercise to reduce her lymphedema. Electronic Signature(s) Signed: 05/23/2016 1:08:03 PM By: Christin Fudge MD, FACS Entered By: Christin Fudge on 05/23/2016 13:08:02 [name] ([id number]) -------------------------------------------------------------------------------- SuperBill Details Patient Name: [name]. Date of Service: 05/23/2016 Medical Record Number: [id number] Patient Account Number: [id number] Date of Birth/Sex: 28-Oct-1940 (76 y.o. Female) Treating RN: Montey Hora Primary Care Physician: Glendale Chard Other Clinician: Referring Physician: Glendale Chard Treating Physician/Extender: Frann Rider in Treatment: 16 Diagnosis Coding ICD-10 Codes Code Description E11.622 Type 2 diabetes mellitus with other skin ulcer L97.222 Non-pressure chronic ulcer of left calf with fat layer exposed I89.0 Lymphedema, not elsewhere classified Facility Procedures CPT4  Code: 73220254 Description: 99213 - WOUND CARE VISIT-LEV 3 EST PT Modifier: Quantity: 1 Physician Procedures CPT4 Code: [id number] Description: 62831 - WC PHYS LEVEL 3 - EST PT ICD-10 Description Diagnosis E11.622 Type 2 diabetes mellitus with other skin ulcer L97.222 Non-pressure chronic ulcer of left calf with fat I89.0 Lymphedema, not elsewhere classified Modifier: layer exposed Quantity: 1 Electronic Signature(s) Signed: 05/23/2016 1:08:46 PM By: Christin Fudge MD, FACS Entered By: Christin Fudge on 05/23/2016 13:08:46

## 2016-05-24 NOTE — Progress Notes (Signed)
LOREDA, SILVERIO ([id number]) Visit Report for 05/23/2016 Arrival Information Details Patient Name: [name], [name]. Date of Service: 05/23/2016 12:30 PM Medical Record Number: [id number] Patient Account Number: [id number] Date of Birth/Sex: [birth year] (76 y.o. Female) Treating RN: Montey Hora Primary Care Physician: Glendale Chard Other Clinician: Referring Physician: Glendale Chard Treating Physician/Extender: Frann Rider in Treatment: 16 Visit Information History Since Last Visit Added or deleted any medications: No Patient Arrived: Wheel Chair Any new allergies or adverse reactions: No Arrival Time: 12:42 Had a fall or experienced change in No activities of daily living that may affect Accompanied By: self risk of falls: Transfer Assistance: None Signs or symptoms of abuse/neglect since last No Patient Identification Verified: Yes visito Secondary Verification Process Yes Hospitalized since last visit: No Completed: Pain Present Now: Yes Patient Has Alerts: Yes Patient Alerts: DM II Electronic Signature(s) Signed: 05/23/2016 5:21:18 PM By: Montey Hora Entered By: Montey Hora on 05/23/2016 12:43:15 [name] ([id number]) -------------------------------------------------------------------------------- Clinic Level of Care Assessment Details Patient Name: [name] Date of Service: 05/23/2016 12:30 PM Medical Record Number: [id number] Patient Account Number: [id number] Date of Birth/Sex: 1940-06-18 (76 y.o. Female) Treating RN: Montey Hora Primary Care Physician: Glendale Chard Other Clinician: Referring Physician: Glendale Chard Treating Physician/Extender: Frann Rider in Treatment: 16 Clinic Level of Care Assessment Items TOOL 4 Quantity Score _0  - Use when only an EandM is performed on FOLLOW-UP visit 0 ASSESSMENTS - Nursing Assessment / Reassessment X - Reassessment of Co-morbidities (includes updates in patient status) 1  10 X - Reassessment of Adherence to Treatment Plan 1 5 ASSESSMENTS - Wound and Skin Assessment / Reassessment X - Simple Wound Assessment / Reassessment - one wound 1 5 _1  - Complex Wound Assessment / Reassessment - multiple wounds 0 _2  - Dermatologic / Skin Assessment (not related to wound area) 0 ASSESSMENTS - Focused Assessment X - Circumferential Edema Measurements - multi extremities 1 5 _3  - Nutritional Assessment / Counseling / Intervention 0 X - Lower Extremity Assessment (monofilament, tuning fork, pulses) 1 5 _4  - Peripheral Arterial Disease Assessment (using hand held doppler) 0 ASSESSMENTS - Ostomy and/or Continence Assessment and Care _5  - Incontinence Assessment and Management 0 _6  - Ostomy Care Assessment and Management (repouching, etc.) 0 PROCESS - Coordination of Care X - Simple Patient / Family Education for ongoing care 1 15 _7  - Complex (extensive) Patient / Family Education for ongoing care 0 _8  - Staff obtains Programmer, systems, Records, Test Results / Process Orders 0 _9  - Staff telephones HHA, Nursing Homes / Clarify orders / etc 0 _10  - Routine Transfer to another Facility (non-emergent condition) 0 [name], [name] ([id number]) _11  - Routine Hospital Admission (non-emergent condition) 0 _12  - New Admissions / Biomedical engineer / Ordering NPWT, Apligraf, etc. 0 _13  - Emergency Hospital Admission (emergent condition) 0 X - Simple Discharge Coordination 1 10 _14  - Complex (extensive) Discharge Coordination 0 PROCESS - Special Needs _15  - Pediatric / Minor Patient Management 0 _16  - Isolation Patient Management 0 _17  - Hearing / Language / Visual special needs 0 _18  - Assessment of Community assistance (transportation, D/C planning, etc.) 0 _19  - Additional assistance / Altered mentation 0 _20  - Support Surface(s) Assessment (bed, cushion, seat, etc.) 0 INTERVENTIONS - Wound Cleansing / Measurement X - Simple Wound Cleansing - one wound 1 5 _21  - Complex Wound Cleansing  - multiple wounds 0 X - Wound Imaging (photographs - any number of wounds) 1 5 _22  - Wound Tracing (instead of photographs) 0  X - Simple Wound Measurement - one wound 1 5 _0  - Complex Wound Measurement - multiple wounds 0 INTERVENTIONS - Wound Dressings X - Small Wound Dressing one or multiple wounds 1 10 _1  - Medium Wound Dressing one or multiple wounds 0 _2  - Large Wound Dressing one or multiple wounds 0 <ZOXWRUEAVWUJWJXB>_1<\/YNWGNFAOZHYQMVHQ>_4  - Application of Medications - topical 0 <ONGEXBMWUXLKGMWN>_0<\/UVOZDGUYQIHKVQQV>_9  - Application of Medications - injection 0 INTERVENTIONS - Miscellaneous _5  - External ear exam 0 [name], [name] ([id number]) _6  - Specimen Collection (cultures, biopsies, blood, body fluids, etc.) 0 _7  - Specimen(s) / Culture(s) sent or taken to Lab for analysis 0 _8  - Patient Transfer (multiple staff / Harrel Lemon Lift / Similar devices) 0 _9  - Simple Staple / Suture removal (25 or less) 0 _10  - Complex Staple / Suture removal (26 or more) 0 _11  - Hypo / Hyperglycemic Management (close monitor of Blood Glucose) 0 _12  - Ankle / Brachial Index (ABI) - do not check if billed separately 0 X - Vital Signs 1 5 Has the patient been seen at the hospital within the last three years: Yes Total Score: 85 Level Of Care: New/Established - Level 3 Electronic Signature(s) Signed: 05/23/2016 5:21:18 PM By: Montey Hora Entered By: Montey Hora on 05/23/2016 13:06:20 [name] ([id number]) -------------------------------------------------------------------------------- Encounter Discharge Information Details Patient Name: [name]. Date of Service: 05/23/2016 12:30 PM Medical Record Number: [id number] Patient Account Number: [id number] Date of Birth/Sex: [birth year] (76 y.o. Female) Treating RN: Montey Hora Primary Care Physician: Glendale Chard Other Clinician: Referring Physician: Glendale Chard Treating Physician/Extender: Frann Rider in Treatment: 16 Encounter Discharge Information Items Discharge Pain Level:  0 Discharge Condition: Stable Ambulatory Status: Walker Discharge Destination: Home Transportation: Private Auto Accompanied By: self Schedule Follow-up Appointment: Yes Medication Reconciliation completed No and provided to Patient/Care Brande Uncapher: Provided on Clinical Summary of Care: 05/23/2016 Form Type Recipient Paper Patient DG Electronic Signature(s) Signed: 05/23/2016 4:37:54 PM By: Montey Hora Previous Signature: 05/23/2016 1:07:28 PM Version By: Ruthine Dose Entered By: Montey Hora on 05/23/2016 16:37:54 [name] ([id number]) -------------------------------------------------------------------------------- Lower Extremity Assessment Details Patient Name: [name]. Date of Service: 05/23/2016 12:30 PM Medical Record Number: [id number] Patient Account Number: [id number] Date of Birth/Sex: [birth year] (76 y.o. Female) Treating RN: Montey Hora Primary Care Physician: Glendale Chard Other Clinician: Referring Physician: Glendale Chard Treating Physician/Extender: Frann Rider in Treatment: 16 Edema Assessment Assessed: [Left: No] [Right: No] Edema: [Left: Ye] [Right: s] Calf Left: Right: Point of Measurement: 34 cm From Medial Instep 46.5 cm cm Ankle Left: Right: Point of Measurement: 11 cm From Medial Instep 31.2 cm cm Vascular Assessment Pulses: Dorsalis Pedis Palpable: [Left:Yes] Posterior Tibial Extremity colors, hair growth, and conditions: Extremity Color: [Left:Hyperpigmented] Hair Growth on Extremity: [Left:No] Temperature of Extremity: [Left:Warm] Capillary Refill: [Left:< 3 seconds] Electronic Signature(s) Signed: 05/23/2016 5:21:18 PM By: Montey Hora Entered By: Montey Hora on 05/23/2016 12:51:28 [name] ([id number]) -------------------------------------------------------------------------------- Multi Wound Chart Details Patient Name: [name]. Date of Service: 05/23/2016 12:30 PM Medical Record  Number: [id number] Patient Account Number: [id number] Date of Birth/Sex: 1941-03-05 (76 y.o. 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 ([id number]) Wound Margin: Distinct, outline attached N/A N/A Granulation Amount: Large (67-100%) N/A N/A Granulation Quality: Red N/A N/A Necrotic Amount: Small (1-33%) N/A N/A Exposed Structures: Fascia: No N/A N/A Fat: No Tendon: No Muscle: No Joint: No Bone: No Limited to Skin Breakdown Epithelialization: Medium (34-66%) N/A N/A Periwound Skin Texture: Edema: Yes N/A N/A Scarring: Yes Excoriation: No Induration: No Callus: No Crepitus: No Fluctuance: No Friable: No Rash: No Periwound Skin Moist: Yes N/A N/A Moisture: Maceration: No Dry/Scaly: No Periwound Skin Color: Ecchymosis: Yes N/A N/A Hemosiderin Staining: Yes Atrophie Blanche: No Cyanosis:  No Erythema: No Mottled: No Pallor: No Rubor: No Temperature: No Abnormality N/A N/A Tenderness on Yes N/A N/A Palpation: Wound Preparation: Ulcer Cleansing: N/A N/A Rinsed/Irrigated with Saline Topical Anesthetic Applied: Other: lidocaine 4% Treatment Notes Electronic Signature(s) Signed: 05/23/2016 1:03:55 PM By: Christin Fudge MD, FACS Entered By: Christin Fudge on 05/23/2016 13:03:55 [name], [name] ([id number] Mcdaniel ([id number]) -------------------------------------------------------------------------------- [place] Details Patient Name: BRITLEE, SKOLNIK. Date of Service: 05/23/2016 12:30 PM Medical Record Number: [id number] Patient Account Number: [id number] Date of Birth/Sex: 03/29/1941 (76 y.o. Female) Treating RN: Montey Hora Primary Care Physician: Glendale Chard Other Clinician: Referring Physician: Glendale Chard Treating Physician/Extender: Frann Rider in Treatment: 16 Active Inactive Abuse / Safety / Falls / Self Care Management Nursing Diagnoses: Impaired physical mobility Potential for falls Goals: Patient will remain injury free Date Initiated: 02/01/2016 Goal Status: Active Interventions: Assess fall risk on admission and as needed Notes: Orientation to the Wound Care Program Nursing Diagnoses: Knowledge deficit related to the wound healing center program Goals: Patient/caregiver will verbalize understanding of the [place] Program Date Initiated: 02/01/2016 Goal Status: Active Interventions: Provide education on orientation to the wound center Notes: Wound/Skin Impairment Nursing Diagnoses: Impaired tissue integrity Goals: Patient/caregiver will verbalize understanding of skin care regimen [name], [name] ([id number]) Date Initiated: 02/01/2016 Goal Status: Active Ulcer/skin breakdown will have a volume reduction of 30% by week 4 Date Initiated: 02/01/2016 Goal Status:  Active Ulcer/skin breakdown will have a volume reduction of 50% by week 8 Date Initiated: 02/01/2016 Goal Status: Active Ulcer/skin breakdown will have a volume reduction of 80% by week 12 Date Initiated: 02/01/2016 Goal Status: Active Ulcer/skin breakdown will heal within 14 weeks Date Initiated: 02/01/2016 Goal Status: Active Interventions: Assess patient/caregiver ability to obtain necessary supplies Assess patient/caregiver ability to perform ulcer/skin care regimen upon admission and as needed Assess ulceration(s) every visit Notes: Electronic Signature(s) Signed: 05/23/2016 5:21:18 PM By: Montey Hora Entered By: Montey Hora on 05/23/2016 12:52:06 [name] ([id number]) -------------------------------------------------------------------------------- Pain Assessment Details Patient Name: [name]. Date of Service: 05/23/2016 12:30 PM Medical Record Number: [id number] Patient Account Number: [id number] Date of Birth/Sex: [birth year] (76 y.o. Female) Treating RN: Montey Hora Primary Care Physician: Glendale Chard Other Clinician: Referring Physician: Glendale Chard Treating Physician/Extender: Frann Rider in Treatment: 16 Active Problems Location of Pain Severity and Description of Pain  Patient Has Paino Yes Site Locations Pain Location: Generalized Pain With Dressing Change: No Duration of the Pain. Constant / Intermittento Constant Pain Management and Medication Current Pain Management: Notes Topical or injectable lidocaine is offered to patient for acute pain when surgical debridement is performed. If needed, Patient is instructed to use over the counter pain medication for the following 24-48 hours after debridement. Wound care MDs do not prescribed pain medications. Patient has chronic pain or uncontrolled pain. Patient has been instructed to make an appointment with their Primary Care Physician for pain management. Electronic  Signature(s) Signed: 05/23/2016 5:21:18 PM By: Montey Hora Entered By: Montey Hora on 05/23/2016 12:44:15 [name] ([id number]) -------------------------------------------------------------------------------- Patient/Caregiver Education Details Patient Name: [name] Date of Service: 05/23/2016 12:30 PM Medical Record Number: [id number] Patient Account Number: [id number] Date of Birth/Gender: 07-28-1940 (76 y.o. Female) Treating RN: Montey Hora Primary Care Physician: Glendale Chard Other Clinician: Referring Physician: Glendale Chard Treating Physician/Extender: Frann Rider in Treatment: 16 Education Assessment Education Provided To: Patient Education Topics Provided Wound/Skin Impairment: Handouts: Other: wound care as ordered Methods: Demonstration, Explain/Verbal Responses: State content correctly Electronic Signature(s) Signed: 05/23/2016 5:21:18 PM By: Montey Hora Entered By: Montey Hora on 05/23/2016 16:38:46 [name] ([id number]) -------------------------------------------------------------------------------- Wound Assessment Details Patient Name: [name]. Date of Service: 05/23/2016 12:30 PM Medical Record Number: [id number] Patient Account Number: [id number] Date of Birth/Sex: 07/20/1940 (76 y.o. Female) Treating RN: Montey Hora Primary Care Physician: Glendale Chard Other Clinician: Referring Physician: Glendale Chard Treating Physician/Extender: Frann Rider in Treatment: 16 Wound Status Wound Number: 1 Primary Diabetic Wound/Ulcer of the Lower Etiology: Extremity Wound Location: Left Lower Leg - Lateral Secondary Auto-immune Wounding Event: Trauma Etiology: Date Acquired: 12/27/2015 Wound Open Weeks Of Treatment: 16 Status: Clustered Wound: No Comorbid Cataracts, Lymphedema, History: Hypertension, Type II Diabetes, Gout, Rheumatoid Arthritis, Osteoarthritis, Neuropathy Photos Wound  Measurements Length: (cm) 0.9 Width: (cm) 0.7 Depth: (cm) 0.1 Area: (cm) 0.495 Volume: (cm) 0.049 % Reduction in Area: 97.5% % Reduction in Volume: 98.8% Epithelialization: Medium (34-66%) Tunneling: No Undermining: No Wound Description Classification: Grade 2 Wound Margin: Distinct, outline attached Exudate Amount: Medium Exudate Type: Sanguinous Exudate Color: red Wound Bed Granulation Amount: Large (67-100%) Exposed Structure Granulation Quality: Red Fascia Exposed: No Karl, Sakai E. ([id number]) Necrotic Amount: Small (1-33%) Fat Layer Exposed: No Necrotic Quality: Adherent Slough Tendon Exposed: No Muscle Exposed: No Joint Exposed: No Bone Exposed: No Limited to Skin Breakdown Periwound Skin Texture Texture Color No Abnormalities Noted: No No Abnormalities Noted: No Callus: No Atrophie Blanche: No Crepitus: No Cyanosis: No Excoriation: No Ecchymosis: Yes Fluctuance: No Erythema: No Friable: No Hemosiderin Staining: Yes Induration: No Mottled: No Localized Edema: Yes Pallor: No Rash: No Rubor: No Scarring: Yes Temperature / Pain Moisture Temperature: No Abnormality No Abnormalities Noted: No Tenderness on Palpation: Yes Dry / Scaly: No Maceration: No Moist: Yes Wound Preparation Ulcer Cleansing: Rinsed/Irrigated with Saline Topical Anesthetic Applied: Other: lidocaine 4%, Treatment Notes Wound #1 (Left, Lateral Lower Leg) 1. Cleansed with: Clean wound with Normal Saline 2. Anesthetic Topical Lidocaine 4% cream to wound bed prior to debridement 4. Dressing Applied: Hydrafera Blue 5. Secondary Dressing Applied Bordered Foam Dressing Electronic Signature(s) Signed: 05/23/2016 5:21:18 PM By: Montey Hora Entered By: Montey Hora on 05/23/2016 12:50:15 [name] ([id number]) -------------------------------------------------------------------------------- Vitals Details Patient Name: [name]. Date of Service:  05/23/2016 12:30 PM Medical Record Number: [id number] Patient Account Number: [id number] Date of Birth/Sex: Oct 11, 1940 (76 y.o. Female) Treating RN: Montey Hora  Primary Care Physician: Glendale Chard Other Clinician: Referring Physician: Glendale Chard Treating Physician/Extender: Frann Rider in Treatment: 16 Vital Signs Time Taken: 12:49 Temperature (F): 97.9 Height (in): 64 Pulse (bpm): 97 Weight (lbs): 283 Respiratory Rate (breaths/min): 16 Body Mass Index (BMI): 48.6 Blood Pressure (mmHg): 133/78 Reference Range: 80 - 120 mg / dl Electronic Signature(s) Signed: 05/23/2016 5:21:18 PM By: Montey Hora Entered By: Montey Hora on 05/23/2016 12:49:34

## 2016-05-27 ENCOUNTER — Ambulatory Visit: Payer: Medicare Other | Admitting: Registered Nurse

## 2016-05-27 DIAGNOSIS — I130 Hypertensive heart and chronic kidney disease with heart failure and stage 1 through stage 4 chronic kidney disease, or unspecified chronic kidney disease: Secondary | ICD-10-CM | POA: Diagnosis not present

## 2016-05-27 DIAGNOSIS — T798XXD Other early complications of trauma, subsequent encounter: Secondary | ICD-10-CM | POA: Diagnosis not present

## 2016-05-27 DIAGNOSIS — I5032 Chronic diastolic (congestive) heart failure: Secondary | ICD-10-CM | POA: Diagnosis not present

## 2016-05-27 DIAGNOSIS — N182 Chronic kidney disease, stage 2 (mild): Secondary | ICD-10-CM | POA: Diagnosis not present

## 2016-05-27 DIAGNOSIS — S81812D Laceration without foreign body, left lower leg, subsequent encounter: Secondary | ICD-10-CM | POA: Diagnosis not present

## 2016-05-27 DIAGNOSIS — E1122 Type 2 diabetes mellitus with diabetic chronic kidney disease: Secondary | ICD-10-CM | POA: Diagnosis not present

## 2016-05-29 DIAGNOSIS — I5032 Chronic diastolic (congestive) heart failure: Secondary | ICD-10-CM | POA: Diagnosis not present

## 2016-05-29 DIAGNOSIS — S81812D Laceration without foreign body, left lower leg, subsequent encounter: Secondary | ICD-10-CM | POA: Diagnosis not present

## 2016-05-29 DIAGNOSIS — T798XXD Other early complications of trauma, subsequent encounter: Secondary | ICD-10-CM | POA: Diagnosis not present

## 2016-05-29 DIAGNOSIS — I130 Hypertensive heart and chronic kidney disease with heart failure and stage 1 through stage 4 chronic kidney disease, or unspecified chronic kidney disease: Secondary | ICD-10-CM | POA: Diagnosis not present

## 2016-05-29 DIAGNOSIS — E1122 Type 2 diabetes mellitus with diabetic chronic kidney disease: Secondary | ICD-10-CM | POA: Diagnosis not present

## 2016-05-29 DIAGNOSIS — N182 Chronic kidney disease, stage 2 (mild): Secondary | ICD-10-CM | POA: Diagnosis not present

## 2016-05-30 ENCOUNTER — Encounter: Payer: Medicare Other | Admitting: Surgery

## 2016-05-30 DIAGNOSIS — I890 Lymphedema, not elsewhere classified: Secondary | ICD-10-CM | POA: Diagnosis not present

## 2016-05-30 DIAGNOSIS — L97221 Non-pressure chronic ulcer of left calf limited to breakdown of skin: Secondary | ICD-10-CM | POA: Diagnosis not present

## 2016-05-30 DIAGNOSIS — J449 Chronic obstructive pulmonary disease, unspecified: Secondary | ICD-10-CM | POA: Diagnosis not present

## 2016-05-30 DIAGNOSIS — E11622 Type 2 diabetes mellitus with other skin ulcer: Secondary | ICD-10-CM | POA: Diagnosis not present

## 2016-05-30 DIAGNOSIS — L97222 Non-pressure chronic ulcer of left calf with fat layer exposed: Secondary | ICD-10-CM | POA: Diagnosis not present

## 2016-05-30 DIAGNOSIS — N189 Chronic kidney disease, unspecified: Secondary | ICD-10-CM | POA: Diagnosis not present

## 2016-05-30 DIAGNOSIS — E1122 Type 2 diabetes mellitus with diabetic chronic kidney disease: Secondary | ICD-10-CM | POA: Diagnosis not present

## 2016-05-31 NOTE — Progress Notes (Signed)
KIMBERL, VIG ([id number]) Visit Report for 05/30/2016 Arrival Information Details Patient Name: [name], [name]. Date of Service: 05/30/2016 1:30 PM Medical Record Number: [id number] Patient Account Number: [id number] Date of Birth/Sex: May 26, 1940 (75 y.o. Female) Treating RN: Montey Hora Primary Care Physician: Glendale Chard Other Clinician: Referring Physician: Glendale Chard Treating Physician/Extender: Frann Rider in Treatment: 34 Visit Information History Since Last Visit Added or deleted any medications: No Patient Arrived: Walker Any new allergies or adverse reactions: No Arrival Time: 13:47 Had a fall or experienced change in No Accompanied By: self activities of daily living that may affect Transfer Assistance: None risk of falls: Patient Identification Verified: Yes Signs or symptoms of abuse/neglect since last No Secondary Verification Process Completed: Yes visito Patient Has Alerts: Yes Hospitalized since last visit: No Patient Alerts: DM II Has Dressing in Place as Prescribed: Yes Has Compression in Place as Prescribed: Yes Pain Present Now: Yes Electronic Signature(s) Signed: 05/30/2016 5:10:42 PM By: Montey Hora Entered By: Montey Hora on 05/30/2016 13:48:05 [name] ([id number]) -------------------------------------------------------------------------------- Clinic Level of Care Assessment Details Patient Name: [name] Date of Service: 05/30/2016 1:30 PM Medical Record Number: [id number] Patient Account Number: [id number] Date of Birth/Sex: November 17, 1940 (75 y.o. Female) Treating RN: Montey Hora Primary Care Physician: Glendale Chard Other Clinician: Referring Physician: Glendale Chard Treating Physician/Extender: Frann Rider in Treatment: 17 Clinic Level of Care Assessment Items TOOL 4 Quantity Score _0  - Use when only an EandM is performed on FOLLOW-UP visit 0 ASSESSMENTS - Nursing Assessment /  Reassessment X - Reassessment of Co-morbidities (includes updates in patient status) 1 10 X - Reassessment of Adherence to Treatment Plan 1 5 ASSESSMENTS - Wound and Skin Assessment / Reassessment X - Simple Wound Assessment / Reassessment - one wound 1 5 _1  - Complex Wound Assessment / Reassessment - multiple wounds 0 _2  - Dermatologic / Skin Assessment (not related to wound area) 0 ASSESSMENTS - Focused Assessment X - Circumferential Edema Measurements - multi extremities 1 5 _3  - Nutritional Assessment / Counseling / Intervention 0 X - Lower Extremity Assessment (monofilament, tuning fork, pulses) 1 5 _4  - Peripheral Arterial Disease Assessment (using hand held doppler) 0 ASSESSMENTS - Ostomy and/or Continence Assessment and Care _5  - Incontinence Assessment and Management 0 _6  - Ostomy Care Assessment and Management (repouching, etc.) 0 PROCESS - Coordination of Care X - Simple Patient / Family Education for ongoing care 1 15 _7  - Complex (extensive) Patient / Family Education for ongoing care 0 _8  - Staff obtains Programmer, systems, Records, Test Results / Process Orders 0 _9  - Staff telephones HHA, Nursing Homes / Clarify orders / etc 0 _10  - Routine Transfer to another Facility (non-emergent condition) 0 Kihn, Hadia E. ([id number]) _11  - Routine Hospital Admission (non-emergent condition) 0 _12  - New Admissions / Biomedical engineer / Ordering NPWT, Apligraf, etc. 0 _13  - Emergency Hospital Admission (emergent condition) 0 X - Simple Discharge Coordination 1 10 _14  - Complex (extensive) Discharge Coordination 0 PROCESS - Special Needs _15  - Pediatric / Minor Patient Management 0 _16  - Isolation Patient Management 0 _17  - Hearing / Language / Visual special needs 0 _18  - Assessment of Community assistance (transportation, D/C planning, etc.) 0 _19  - Additional assistance / Altered mentation 0 _20  - Support Surface(s) Assessment (bed, cushion, seat, etc.) 0 INTERVENTIONS - Wound Cleansing  / Measurement X - Simple Wound Cleansing - one wound 1 5 _21  - Complex Wound Cleansing - multiple wounds 0 X - Wound Imaging (photographs - any  number of wounds) 1 5 _0  - Wound Tracing (instead of photographs) 0 X - Simple Wound Measurement - one wound 1 5 _1  - Complex Wound Measurement - multiple wounds 0 INTERVENTIONS - Wound Dressings X - Small Wound Dressing one or multiple wounds 1 10 _2  - Medium Wound Dressing one or multiple wounds 0 _3  - Large Wound Dressing one or multiple wounds 0 <WRUEAVWUJWJXBJYN>_8<\/GNFAOZHYQMVHQION>_6  - Application of Medications - topical 0 <EXBMWUXLKGMWNUUV>_2<\/ZDGUYQIHKVQQVZDG>_3  - Application of Medications - injection 0 INTERVENTIONS - Miscellaneous _6  - External ear exam 0 Roache, Laina E. ([id number]) _7  - Specimen Collection (cultures, biopsies, blood, body fluids, etc.) 0 _8  - Specimen(s) / Culture(s) sent or taken to Lab for analysis 0 _9  - Patient Transfer (multiple staff / Harrel Lemon Lift / Similar devices) 0 _10  - Simple Staple / Suture removal (25 or less) 0 _11  - Complex Staple / Suture removal (26 or more) 0 _12  - Hypo / Hyperglycemic Management (close monitor of Blood Glucose) 0 _13  - Ankle / Brachial Index (ABI) - do not check if billed separately 0 X - Vital Signs 1 5 Has the patient been seen at the hospital within the last three years: Yes Total Score: 85 Level Of Care: New/Established - Level 3 Electronic Signature(s) Signed: 05/30/2016 5:10:42 PM By: Montey Hora Entered By: Montey Hora on 05/30/2016 14:07:02 [name] ([id number]) -------------------------------------------------------------------------------- Encounter Discharge Information Details Patient Name: [name]. Date of Service: 05/30/2016 1:30 PM Medical Record Number: [id number] Patient Account Number: [id number] Date of Birth/Sex: 1941-05-01 (75 y.o. Female) Treating RN: Montey Hora Primary Care Physician: Glendale Chard Other Clinician: Referring Physician: Glendale Chard Treating Physician/Extender: Frann Rider in Treatment: 17 Encounter Discharge Information Items Discharge Pain Level: 0 Discharge Condition: Stable Ambulatory Status: Walker Discharge Destination: Home Transportation: Private Auto Accompanied By: self Schedule Follow-up Appointment: Yes Medication Reconciliation completed No and provided to Patient/Care Tovah Slavick: Provided on Clinical Summary of Care: 05/30/2016 Form Type Recipient Paper Patient DG Electronic Signature(s) Signed: 05/30/2016 2:20:43 PM By: Montey Hora Previous Signature: 05/30/2016 2:12:10 PM Version By: Ruthine Dose Entered By: Montey Hora on 05/30/2016 14:20:42 [name] ([id number]) -------------------------------------------------------------------------------- Lower Extremity Assessment Details Patient Name: [name]. Date of Service: 05/30/2016 1:30 PM Medical Record Number: [id number] Patient Account Number: [id number] Date of Birth/Sex: [birth year] (75 y.o. Female) Treating RN: Montey Hora Primary Care Physician: Glendale Chard Other Clinician: Referring Physician: Glendale Chard Treating Physician/Extender: Frann Rider in Treatment: 17 Edema Assessment Assessed: [Left: No] [Right: No] Edema: [Left: Ye] [Right: s] Calf Left: Right: Point of Measurement: 34 cm From Medial Instep 45.7 cm cm Ankle Left: Right: Point of Measurement: 11 cm From Medial Instep 28.8 cm cm Vascular Assessment Pulses: Dorsalis Pedis Palpable: [Left:Yes] Posterior Tibial Extremity colors, hair growth, and conditions: Extremity Color: [Left:Hyperpigmented] Hair Growth on Extremity: [Left:No] Temperature of Extremity: [Left:Warm] Capillary Refill: [Left:< 3 seconds] Electronic Signature(s) Signed: 05/30/2016 5:10:42 PM By: Montey Hora Entered By: Montey Hora on 05/30/2016 13:55:00 [name] ([id number]) -------------------------------------------------------------------------------- Multi Wound Chart  Details Patient Name: [name]. Date of Service: 05/30/2016 1:30 PM Medical Record Number: [id number] Patient Account Number: [id number] Date of Birth/Sex: 06-14-1940 (75 y.o. 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 ([id number]) Wound Margin: Distinct, outline attached N/A N/A Granulation Amount: Large (67-100%) N/A N/A Granulation Quality: Red N/A N/A Necrotic Amount: None Present (0%) N/A N/A Exposed Structures: Fascia: No N/A N/A Fat: No Tendon: No Muscle: No Joint: No Bone: No Limited to Skin Breakdown Epithelialization: Medium (34-66%) N/A N/A Periwound Skin Texture: Edema: Yes N/A N/A Scarring: Yes Excoriation: No Induration: No Callus: No Crepitus: No Fluctuance: No Friable: No Rash: No Periwound Skin Moist: Yes N/A N/A Moisture: Maceration: No Dry/Scaly: No Periwound  Skin Color: Ecchymosis: Yes N/A N/A Hemosiderin Staining: Yes Atrophie Blanche: No Cyanosis: No Erythema: No Mottled: No Pallor: No Rubor: No Temperature: No Abnormality N/A N/A Tenderness on Yes N/A N/A Palpation: Wound Preparation: Ulcer Cleansing: N/A N/A Rinsed/Irrigated with Saline Topical Anesthetic Applied: Other: lidocaine 4% Treatment Notes Electronic Signature(s) Signed: 05/30/2016 2:09:10 PM By: Christin Fudge MD, FACS Entered By: Christin Fudge on 05/30/2016 14:09:10 [name] ([id number]) KAYTEE, TALIERCIO ([id number]) -------------------------------------------------------------------------------- [place] Details Patient Name: CLAUDIA, GREENLEY. Date of Service: 05/30/2016 1:30 PM Medical Record Number: [id number] Patient Account Number: [id number] Date of Birth/Sex: 26-Jun-1940 (75 y.o. Female) Treating RN: Montey Hora Primary Care Physician: Glendale Chard Other Clinician: Referring Physician: Glendale Chard Treating Physician/Extender: Frann Rider in Treatment: 10 Active Inactive Abuse / Safety / Falls / Self Care Management Nursing Diagnoses: Impaired physical mobility Potential for falls Goals: Patient will remain injury free Date Initiated: 02/01/2016 Goal Status: Active Interventions: Assess fall risk on admission and as needed Notes: Orientation to the Wound Care Program Nursing Diagnoses: Knowledge deficit related to the wound healing center program Goals: Patient/caregiver will verbalize understanding of the [place] Program Date Initiated: 02/01/2016 Goal Status: Active Interventions: Provide education on orientation to the wound center Notes: Wound/Skin Impairment Nursing Diagnoses: Impaired tissue integrity Goals: Patient/caregiver will verbalize understanding of skin care regimen SKYLENE, DEREMER ([id number]) Date Initiated: 02/01/2016 Goal Status: Active Ulcer/skin breakdown  will have a volume reduction of 30% by week 4 Date Initiated: 02/01/2016 Goal Status: Active Ulcer/skin breakdown will have a volume reduction of 50% by week 8 Date Initiated: 02/01/2016 Goal Status: Active Ulcer/skin breakdown will have a volume reduction of 80% by week 12 Date Initiated: 02/01/2016 Goal Status: Active Ulcer/skin breakdown will heal within 14 weeks Date Initiated: 02/01/2016 Goal Status: Active Interventions: Assess patient/caregiver ability to obtain necessary supplies Assess patient/caregiver ability to perform ulcer/skin care regimen upon admission and as needed Assess ulceration(s) every visit Notes: Electronic Signature(s) Signed: 05/30/2016 5:10:42 PM By: Montey Hora Entered By: Montey Hora on 05/30/2016 13:56:36 [name], Inna E. ([id number]) -------------------------------------------------------------------------------- Pain Assessment Details Patient Name: [name]. Date of Service: 05/30/2016 1:30 PM Medical Record Number: [id number] Patient Account Number: [id number] Date of Birth/Sex: 05-05-1941 (75 y.o. Female) Treating RN: Montey Hora Primary Care Physician: Glendale Chard Other Clinician: Referring Physician: Glendale Chard Treating Physician/Extender: Christin Fudge  Weeks in Treatment: 17 Active Problems Location of Pain Severity and Description of Pain Patient Has Paino Yes Site Locations Pain Location: Generalized Pain With Dressing Change: No Duration of the Pain. Constant / Intermittento Constant Pain Management and Medication Current Pain Management: Notes Topical or injectable lidocaine is offered to patient for acute pain when surgical debridement is performed. If needed, Patient is instructed to use over the counter pain medication for the following 24-48 hours after debridement. Wound care MDs do not prescribed pain medications. Patient has chronic pain or uncontrolled pain. Patient has been instructed to make an  appointment with their Primary Care Physician for pain management. Electronic Signature(s) Signed: 05/30/2016 5:10:42 PM By: Montey Hora Entered By: Montey Hora on 05/30/2016 13:48:17 [name] ([id number]) -------------------------------------------------------------------------------- Patient/Caregiver Education Details Patient Name: [name] Date of Service: 05/30/2016 1:30 PM Medical Record Number: [id number] Patient Account Number: [id number] Date of Birth/Gender: 1940-09-11 (75 y.o. Female) Treating RN: Montey Hora Primary Care Physician: Glendale Chard Other Clinician: Referring Physician: Glendale Chard Treating Physician/Extender: Frann Rider in Treatment: 17 Education Assessment Education Provided To: Patient Education Topics Provided Wound/Skin Impairment: Handouts: Other: wound care as ordered Methods: Demonstration, Explain/Verbal Responses: State content correctly Electronic Signature(s) Signed: 05/30/2016 5:10:42 PM By: Montey Hora Entered By: Montey Hora on 05/30/2016 14:21:00 [name] ([id number]) -------------------------------------------------------------------------------- Wound Assessment Details Patient Name: [name]. Date of Service: 05/30/2016 1:30 PM Medical Record Number: [id number] Patient Account Number: [id number] Date of Birth/Sex: [birth year] (75 y.o. Female) Treating RN: Montey Hora Primary Care Physician: Glendale Chard Other Clinician: Referring Physician: Glendale Chard Treating Physician/Extender: Frann Rider in Treatment: 17 Wound Status Wound Number: 1 Primary Diabetic Wound/Ulcer of the Lower Etiology: Extremity Wound Location: Left Lower Leg - Lateral Secondary Auto-immune Wounding Event: Trauma Etiology: Date Acquired: 12/27/2015 Wound Open Weeks Of Treatment: 17 Status: Clustered Wound: No Comorbid Cataracts, Lymphedema, History: Hypertension, Type II  Diabetes, Gout, Rheumatoid Arthritis, Osteoarthritis, Neuropathy Photos Wound Measurements Length: (cm) 0.6 Width: (cm) 0.4 Depth: (cm) 0.1 Area: (cm) 0.188 Volume: (cm) 0.019 % Reduction in Area: 99.1% % Reduction in Volume: 99.5% Epithelialization: Medium (34-66%) Tunneling: No Undermining: No Wound Description Classification: Grade 2 Wound Margin: Distinct, outline attached Exudate Amount: Medium Exudate Type: Sanguinous Exudate Color: red Wound Bed Granulation Amount: Large (67-100%) Exposed Structure Granulation Quality: Red Fascia Exposed: No Power, Ellina E. ([id number]) Necrotic Amount: None Present (0%) Fat Layer Exposed: No Tendon Exposed: No Muscle Exposed: No Joint Exposed: No Bone Exposed: No Limited to Skin Breakdown Periwound Skin Texture Texture Color No Abnormalities Noted: No No Abnormalities Noted: No Callus: No Atrophie Blanche: No Crepitus: No Cyanosis: No Excoriation: No Ecchymosis: Yes Fluctuance: No Erythema: No Friable: No Hemosiderin Staining: Yes Induration: No Mottled: No Localized Edema: Yes Pallor: No Rash: No Rubor: No Scarring: Yes Temperature / Pain Moisture Temperature: No Abnormality No Abnormalities Noted: No Tenderness on Palpation: Yes Dry / Scaly: No Maceration: No Moist: Yes Wound Preparation Ulcer Cleansing: Rinsed/Irrigated with Saline Topical Anesthetic Applied: Other: lidocaine 4%, Treatment Notes Wound #1 (Left, Lateral Lower Leg) 1. Cleansed with: Clean wound with Normal Saline 4. Dressing Applied: Hydrafera Blue 5. Secondary [place] Signature(s) Signed: 05/30/2016 5:10:42 PM By: Montey Hora Entered By: Montey Hora on 05/30/2016 13:53:56 [name] ([id number]) -------------------------------------------------------------------------------- Vitals Details Patient Name: [name]. Date of Service: 05/30/2016 1:30 PM Medical Record  Number: [id number] Patient Account Number: [id number] Date of Birth/Sex: Aug 14, 1940 (75 y.o. Female) Treating RN: Montey Hora Primary Care  Physician: Glendale Chard Other Clinician: Referring Physician: Glendale Chard Treating Physician/Extender: Frann Rider in Treatment: 17 Vital Signs Time Taken: 13:48 Temperature (F): 98.0 Height (in): 64 Pulse (bpm): 92 Weight (lbs): 283 Respiratory Rate (breaths/min): 16 Body Mass Index (BMI): 48.6 Blood Pressure (mmHg): 105/66 Reference Range: 80 - 120 mg / dl Electronic Signature(s) Signed: 05/30/2016 5:10:42 PM By: Montey Hora Entered By: Montey Hora on 05/30/2016 13:51:08

## 2016-05-31 NOTE — Progress Notes (Signed)
[name], [name] ([id number]) Visit Report for 05/30/2016 Chief Complaint Document Details Patient Name: [name], [name]. Date of Service: 05/30/2016 1:30 PM Medical Record Number: [id number] Patient Account Number: [id number] Date of Birth/Sex: 1940/05/25 (75 y.o. Female) Treating RN: Montey Hora Primary Care Physician: Glendale Chard Other Clinician: Referring Physician: Glendale Chard Treating Physician/Extender: Frann Rider in Treatment: 17 Information Obtained from: Patient Chief Complaint [name] presents for evaluation of her left leg ulcer Electronic Signature(s) Signed: 05/30/2016 2:09:16 PM By: Christin Fudge MD, FACS Entered By: Christin Fudge on 05/30/2016 14:09:16 [name] ([id number]) -------------------------------------------------------------------------------- HPI Details Patient Name: [name]. Date of Service: 05/30/2016 1:30 PM Medical Record Number: [id number] Patient Account Number: [id number] Date of Birth/Sex: May 11, 1941 (75 y.o. Female) Treating RN: Montey Hora Primary Care Physician: Glendale Chard Other Clinician: Referring Physician: Glendale Chard Treating Physician/Extender: Frann Rider in Treatment: 17 History of Present Illness Location: left lateral lower extremity Quality: denies pain Severity: denies pain Duration: ulcer has been present for several months Timing: denies pain Context: traumatic injury HPI Description: 76 year old patient with a past medical history of diabetes mellitus, chronic kidney disease, COPD, hypertension, rheumatoid arthritis had a lacerated wound to her left calf when she injured it on the corner of a car door and it was a long 12 cm laceration. x-ray done at the time revealed that there was no fracture or radiopaque foreign body. the wound was sutured with 4-0 Prolene simple interrupted sutures and a total of 14 sutures were applied. on August 31 she was seen in the ER  and it was noted that the wound had dehisced and she was treated with Keflex. She has completed a course of doxycycline recently Medical history also significant for status post abdominal hysterectomy, brain surgery, cardiac catheterization, EGD and bronchoscopy. of late she was back in the ER and was seen last on August 31 and they tried Silvadene 02/22/16: pt reports discomfort and mild pain just below her wound. however, she admits on legs more than usual. no redness, foul odor or purulence. the wound is measuring slightly larger today, but likely secondary to poor edema control. nursing staff gave information to pt today regarding purchase of her compression stockings. she was encouraged to purchase soon. 02/29/16: returns today for f/u. she denies systemic s/s of infection. she brought her new compression stockings today. 03/21/2016 -- She has been put on an antibiotic and prednisone for a bronchitis by her pulmonologist. 04/04/2016 -- she continues to be on prednisone. 04-18-2016 [name] is for evaluation of her left lateral leg ulcer. She states that she is still on a titrating dose of prednisone. She also states that she recently received a steroid injection to her left knee. She states that her blood sugars have elevated slightly with the max reading in the 130s. She denies any pain or discomfort to her left leg ulcer. She states that she has been unable to wear her compression stockings and as a result has ankle increased edema to the left leg. 05/08/2016 - [name] returns today for evaluation of her left lateral leg ulcer. She states that she has not been able to wear her compression hose due to her arthritis and pain in her hands. She denies any issues with her wound. 05/16/2016 -- venous duplex studies which I had ordered have not been done yet and I have asked her to get in touch with the office. 05/23/2016 -- venous duplex studies was done today and we will await the  report. [name], [name] ([id number]) 05/30/2016 -- had a lower extremity venous duplex reflux evaluation which showed no evidence of DVT in the left femoral popliteal venous system. There was venous incompetence in the left saphenofemoral junction greater saphenous vein, common femoral vein and popliteal vein. Based on the above I would recommend a opinion from the vascular surgeons Electronic Signature(s) Signed: 05/30/2016 2:10:07 PM By: Christin Fudge MD, FACS Entered By: Christin Fudge on 05/30/2016 14:10:07 [name] ([id number]) -------------------------------------------------------------------------------- Physical Exam Details Patient Name: [name]. Date of Service: 05/30/2016 1:30 PM Medical Record Number: [id number] Patient Account Number: [id number] Date of Birth/Sex: 1940-09-10 (75 y.o. Female) Treating RN: Montey Hora Primary Care Physician: Glendale Chard Other Clinician: Referring Physician: Glendale Chard Treating Physician/Extender: Frann Rider in Treatment: 17 Constitutional . Pulse regular. Respirations normal and unlabored. Afebrile. . Eyes Nonicteric. Reactive to light. Ears, Nose, Mouth, and Throat Lips, teeth, and gums WNL.[name] Moist mucosa without lesions. Neck supple and nontender. No palpable supraclavicular or cervical adenopathy. Normal sized without goiter. Respiratory WNL. No retractions.. Breath sounds WNL, No rubs, rales, rhonchi, or wheeze.. Cardiovascular Heart rhythm and rate regular, no murmur or Mcdaniel.. Pedal Pulses WNL. No clubbing, cyanosis or edema. Chest Breasts symmetical and no nipple discharge.. Breast tissue WNL, no masses, lumps, or tenderness.. Lymphatic No adneopathy. No adenopathy. No adenopathy. Musculoskeletal Adexa without tenderness or enlargement.. Digits and nails w/o clubbing, cyanosis, infection, petechiae, ischemia, or inflammatory conditions.. Integumentary (Hair, Skin) No suspicious  lesions. No crepitus or fluctuance. No peri-wound warmth or erythema. No masses.[name] Psychiatric Judgement and insight Intact.. No evidence of depression, anxiety, or agitation.. Notes the lymphedema is less and the ulcer is almost completely healed except for maybe a few millimeters Electronic Signature(s) Signed: 05/30/2016 2:10:34 PM By: Christin Fudge MD, FACS Entered By: Christin Fudge on 05/30/2016 14:10:33 [name] ([id number]) -------------------------------------------------------------------------------- Physician Orders Details Patient Name: [name]. Date of Service: 05/30/2016 1:30 PM Medical Record Number: [id number] Patient Account Number: [id number] Date of Birth/Sex: Jan 27, 1941 (75 y.o. Female) Treating RN: Montey Hora Primary Care Physician: Glendale Chard Other Clinician: Referring Physician: Glendale Chard Treating Physician/Extender: Frann Rider in Treatment: 81 Verbal / Phone Orders: Yes Clinician: Montey Hora Read Back and Verified: Yes Diagnosis Coding Wound Cleansing Wound #1 Left,Lateral Lower Leg o Clean wound with Normal Saline. o May Shower, gently pat wound dry prior to applying new dressing. Anesthetic Wound #1 Left,Lateral Lower Leg o Topical Lidocaine 4% cream applied to wound bed prior to debridement Primary Wound Dressing Wound #1 Left,Lateral Lower Leg o Hydrafera Blue Secondary Dressing Wound #1 Left,Lateral Lower Leg o Other - telfa island Dressing Change Frequency Wound #1 Left,Lateral Lower Leg o Three times weekly - Friday in Clinic Follow-up Appointments Wound #1 Left,Lateral Lower Leg o Return Appointment in 1 week. Edema Control Wound #1 Left,Lateral Lower Leg o Patient to wear own compression stockings Home Health Wound #1 Left,Lateral Lower Leg o [place] Visits - [place] Nurse may visit PRN to address patientos wound care needs. o FACE TO FACE  ENCOUNTER: MEDICARE and MEDICAID PATIENTS: I certify that this patient is under my care and that I had a face-to-face encounter that meets the physician face-to-face [name], [name] ([id number]) encounter requirements with this patient on this date. The encounter with the patient was in whole or in part for the following MEDICAL CONDITION: (primary reason for [place]) MEDICAL NECESSITY: I certify, that based on my findings, NURSING services are a medically  necessary home health service. HOME BOUND STATUS: I certify that my clinical findings support that this patient is homebound (i.e., Due to illness or injury, pt requires aid of supportive devices such as crutches, cane, wheelchairs, walkers, the use of special transportation or the assistance of another person to leave their place of residence. There is a normal inability to leave the home and doing so requires considerable and taxing effort. Other absences are for medical reasons / religious services and are infrequent or of short duration when for other reasons). o If current dressing causes regression in wound condition, may D/C ordered dressing product/s and apply Normal Saline Moist Dressing daily until next [place] / Other MD appointment. [place] of regression in wound condition at [phone]. o Please direct any NON-WOUND related issues/requests for orders to patient's Primary Care Physician Electronic Signature(s) Signed: 05/30/2016 4:00:20 PM By: Christin Fudge MD, FACS Signed: 05/30/2016 5:10:42 PM By: Montey Hora Entered By: Montey Hora on 05/30/2016 14:06:35 [name] ([id number]) -------------------------------------------------------------------------------- Problem List Details Patient Name: [name], MIAN. Date of Service: 05/30/2016 1:30 PM Medical Record Number: [id number] Patient Account Number: [id number] Date of Birth/Sex: 08/22/1940 (75 y.o.  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 Date of Service: 05/30/2016 1:30 PM Medical Record Number: [id number] Patient Account Number: [id number] Date of Birth/Sex: [birth year] (75 y.o. Female) Treating RN: Montey Hora Primary Care Physician: Glendale Chard Other Clinician: Referring Physician: Glendale Chard Treating Physician/Extender: Frann Rider in Treatment: 17 Subjective Chief Complaint Information obtained from Patient Ms. Exline presents for evaluation of her left leg ulcer History of Present Illness (HPI) The following HPI elements were documented for the patient's wound: Location: left lateral lower extremity Quality: denies pain Severity: denies pain Duration: ulcer has been present for several months Timing: denies pain Context: traumatic injury 76 year old patient with a past medical history of diabetes mellitus, chronic kidney disease, COPD, hypertension, rheumatoid arthritis had a lacerated wound to her left calf when she injured it on the corner of a car door and it was a long 12 cm laceration. x-ray done at  the time revealed that there was no fracture or radiopaque foreign body. the wound was sutured with 4-0 Prolene simple interrupted sutures and a total of 14 sutures were applied. on August 31 she was seen in the ER and it was noted that the wound had dehisced and she was treated with Keflex. She has completed a course of doxycycline recently Medical history also significant for status post abdominal hysterectomy, brain surgery, cardiac catheterization, EGD and bronchoscopy. of late she was back in the ER and was seen last on August 31 and they tried Silvadene 02/22/16: pt reports discomfort and mild pain just below her wound. however, she admits on legs more than usual. no redness, foul odor or purulence. the wound is measuring slightly larger today, but likely secondary to poor  edema control. nursing staff gave information to pt today regarding purchase of her compression stockings. she was encouraged to purchase soon. 02/29/16: returns today for f/u. she denies systemic s/s of infection. she brought her new compression stockings today. 03/21/2016 -- She has been put on an antibiotic and prednisone for a bronchitis by her pulmonologist. 04/04/2016 -- she continues to be on prednisone. 04-18-2016 Ms. Fahr is for evaluation of her left lateral leg ulcer. She states that she is still on a titrating dose of prednisone. She also states that she recently received a steroid injection to her left knee. She states that her blood sugars have elevated slightly with the max reading in the 130s. She denies any pain or discomfort to her left leg ulcer. She states that she has been unable to wear her compression stockings and as a result has ankle increased edema to the left leg. [name], [name] ([id number]) 05/08/2016 - Ms. Makris returns today for evaluation of her left lateral leg ulcer. She states that she has not been able to wear her compression hose due to her arthritis and pain in her hands. She  denies any issues with her wound. 05/16/2016 -- venous duplex studies which I had ordered have not been done yet and I have asked her to get in touch with the office. 05/23/2016 -- venous duplex studies was done today and we will await the report. 05/30/2016 -- had a lower extremity venous duplex reflux evaluation which showed no evidence of DVT in the left femoral popliteal venous system. There was venous incompetence in the left saphenofemoral junction greater saphenous vein, common femoral vein and popliteal vein. Based on the above I would recommend a opinion from the vascular surgeons Objective Constitutional Pulse regular. Respirations normal and unlabored. Afebrile. Vitals Time Taken: 1:48 PM, Height: 64 in, Weight: 283 lbs, BMI: 48.6, Temperature: 98.0 F, Pulse: 92 bpm, Respiratory Rate: 16 breaths/min, Blood Pressure: 105/66 mmHg. Eyes Nonicteric. Reactive to light. Ears, Nose, Mouth, and Throat Lips, teeth, and gums WNL.[name] Moist mucosa without lesions. Neck supple and nontender. No palpable supraclavicular or cervical adenopathy. Normal sized without goiter. Respiratory WNL. No retractions.. Breath sounds WNL, No rubs, rales, rhonchi, or wheeze.. Cardiovascular Heart rhythm and rate regular, no murmur or Mcdaniel.. Pedal Pulses WNL. No clubbing, cyanosis or edema. Chest Breasts symmetical and no nipple discharge.. Breast tissue WNL, no masses, lumps, or tenderness.. Lymphatic No adneopathy. No adenopathy. No adenopathy. Musculoskeletal [name], [name]. ([id number]) Adexa without tenderness or enlargement.. Digits and nails w/o clubbing, cyanosis, infection, petechiae, ischemia, or inflammatory conditions.[name] Psychiatric Judgement and insight Intact.. No evidence of depression, anxiety, or agitation.. General Notes: the lymphedema is less and the ulcer is almost completely healed except for maybe a few millimeters Integumentary (Hair, Skin) No suspicious lesions. No  crepitus or fluctuance. No peri-wound warmth or erythema. No masses.. Wound #1 status is Open. Original cause of wound was Trauma. The wound is located on the Left,Lateral Lower Leg. The wound measures 0.6cm length x 0.4cm width x 0.1cm depth; 0.188cm^2 area and 0.019cm^3 volume. The wound is limited to skin breakdown. There is no tunneling or undermining noted. There is a medium amount of sanguinous drainage noted. The wound margin is distinct with the outline attached to the wound base. There is large (67-100%) red granulation within the wound bed. There is no necrotic tissue within the wound bed. The periwound skin appearance exhibited: Localized Edema, Scarring, Moist, Ecchymosis, Hemosiderin Staining. The periwound skin appearance did not exhibit: Callus, Crepitus,  Excoriation, Fluctuance, Friable, Induration, Rash, Dry/Scaly, Maceration, Atrophie Blanche, Cyanosis, Mottled, Pallor, Rubor, Erythema. Periwound temperature was noted as No Abnormality. The periwound has tenderness on palpation. 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 May Shower, gently pat wound dry prior to applying new dressing. Anesthetic: Wound #1 Left,Lateral Lower Leg: [name], [name] ([id number]) Topical Lidocaine 4% cream applied to wound bed prior to debridement Primary Wound Dressing: Wound #1 Left,Lateral Lower Leg: Hydrafera Blue Secondary Dressing: Wound #1 Left,Lateral Lower Leg: Other - telfa island Dressing Change Frequency: Wound #1 Left,Lateral Lower Leg: Three times weekly - Friday in Clinic Follow-up Appointments: Wound #1 Left,Lateral Lower Leg: Return Appointment in 1  week. Edema Control: Wound #1 Left,Lateral Lower Leg: Patient to wear own compression stockings Home Health: Wound #1 Left,Lateral Lower Leg: Continue [facility] Vis[facility] - [facility] Nurse may visit PRN to address patient s wound care needs. FACE TO FACE ENCOUNTER: MEDICARE and MEDICAID PATIENTS: I certify that this patient is under my care and that I had a face-to-face encounter that meets the physician face-to-face encounter requirements with this patient on this date. The encounter with the patient was in whole or in part for the following MEDICAL CONDITION: (primary reason for [place]) MEDICAL NECESSITY: I certify, that based on my findings, NURSING services are a medically necessary home health service. HOME BOUND STATUS: I certify that my clinical findings support that this patient is homebound (i.e., Due to illness or injury, pt requires aid of supportive devices such as crutches, cane, wheelchairs, walkers, the use of special transportation or the assistance of another person to leave their place of residence. There is a normal inability to leave the home and doing so requires considerable and taxing effort. Other absences are for medical reasons / religious services and are infrequent or of short duration when for other reasons). If current dressing causes regression in wound condition, may D/C ordered dressing product/s and apply Normal Saline Moist Dressing daily until next [place] / Other MD appointment. [place] of regression in wound condition at [phone]. Please direct any NON-WOUND related issues/requests for orders to patient's Primary Care Physician I have recommended we use Hydrofera Blue and we should continue her local compression stockings. I anticipate discharge by next week. I had ordered a formal venous reflux study and this was done today at vein and vascular at [facility] -- report is reviewed with her and she  will require vascular surgeons opinion regarding endovenous ablation I have told her to aggressively elevate and exercise to reduce her lymphedema. [name], [name] ([id number]) Electronic Signature(s) Signed: 05/30/2016 2:11:45 PM By: Christin Fudge MD, FACS Entered By: Christin Fudge on 05/30/2016 14:11:44 [name] ([id number]) -------------------------------------------------------------------------------- SuperBill Details Patient Name: [name]. Date of Service: 05/30/2016 Medical Record Number: [id number] Patient Account Number: [id number] Date of Birth/Sex: 02/26/1941 (76 y.o. Female) Treating RN: Montey Hora Primary Care Physician: Glendale Chard Other Clinician: Referring Physician: Glendale Chard Treating Physician/Extender: Frann Rider in Treatment: 17 Diagnosis Coding ICD-10 Codes Code Description E11.622 Type 2 diabetes mellitus with other skin ulcer L97.222 Non-pressure chronic ulcer of left calf with fat layer exposed I89.0 Lymphedema, not elsewhere classified I83.022 Varicose veins  of left lower extremity with ulcer of calf Facility Procedures CPT4 Code: [id number] Description: 99213 - WOUND CARE VISIT-LEV 3 EST PT Modifier: Quantity: 1 Physician Procedures CPT4 Code: [id number] Description: 84859 - WC PHYS LEVEL 3 - EST PT ICD-10 Description Diagnosis E11.622 Type 2 diabetes mellitus with other skin ulcer L97.222 Non-pressure chronic ulcer of left calf with fat I89.0 Lymphedema, not elsewhere classified I83.022 Varicose veins  of left lower extremity with ulcer Modifier: layer exposed of calf Quantity: 1 Electronic Signature(s) Signed: 05/30/2016 2:12:12 PM By: Christin Fudge MD, FACS Entered By: Christin Fudge on 05/30/2016 14:12:12

## 2016-06-02 ENCOUNTER — Ambulatory Visit: Payer: Medicare Other | Admitting: Internal Medicine

## 2016-06-02 DIAGNOSIS — E1122 Type 2 diabetes mellitus with diabetic chronic kidney disease: Secondary | ICD-10-CM | POA: Diagnosis not present

## 2016-06-02 DIAGNOSIS — S81812D Laceration without foreign body, left lower leg, subsequent encounter: Secondary | ICD-10-CM | POA: Diagnosis not present

## 2016-06-02 DIAGNOSIS — I130 Hypertensive heart and chronic kidney disease with heart failure and stage 1 through stage 4 chronic kidney disease, or unspecified chronic kidney disease: Secondary | ICD-10-CM | POA: Diagnosis not present

## 2016-06-02 DIAGNOSIS — I5032 Chronic diastolic (congestive) heart failure: Secondary | ICD-10-CM | POA: Diagnosis not present

## 2016-06-02 DIAGNOSIS — T798XXD Other early complications of trauma, subsequent encounter: Secondary | ICD-10-CM | POA: Diagnosis not present

## 2016-06-02 DIAGNOSIS — N182 Chronic kidney disease, stage 2 (mild): Secondary | ICD-10-CM | POA: Diagnosis not present

## 2016-06-06 ENCOUNTER — Ambulatory Visit: Payer: Medicare Other | Admitting: Surgery

## 2016-06-06 DIAGNOSIS — E1122 Type 2 diabetes mellitus with diabetic chronic kidney disease: Secondary | ICD-10-CM | POA: Diagnosis not present

## 2016-06-06 DIAGNOSIS — T798XXD Other early complications of trauma, subsequent encounter: Secondary | ICD-10-CM | POA: Diagnosis not present

## 2016-06-06 DIAGNOSIS — I130 Hypertensive heart and chronic kidney disease with heart failure and stage 1 through stage 4 chronic kidney disease, or unspecified chronic kidney disease: Secondary | ICD-10-CM | POA: Diagnosis not present

## 2016-06-06 DIAGNOSIS — N182 Chronic kidney disease, stage 2 (mild): Secondary | ICD-10-CM | POA: Diagnosis not present

## 2016-06-06 DIAGNOSIS — I5032 Chronic diastolic (congestive) heart failure: Secondary | ICD-10-CM | POA: Diagnosis not present

## 2016-06-06 DIAGNOSIS — S81812D Laceration without foreign body, left lower leg, subsequent encounter: Secondary | ICD-10-CM | POA: Diagnosis not present

## 2016-06-09 ENCOUNTER — Encounter: Payer: Medicare Other | Admitting: Surgery

## 2016-06-09 DIAGNOSIS — N189 Chronic kidney disease, unspecified: Secondary | ICD-10-CM | POA: Diagnosis not present

## 2016-06-09 DIAGNOSIS — L97222 Non-pressure chronic ulcer of left calf with fat layer exposed: Secondary | ICD-10-CM | POA: Diagnosis not present

## 2016-06-09 DIAGNOSIS — L97829 Non-pressure chronic ulcer of other part of left lower leg with unspecified severity: Secondary | ICD-10-CM | POA: Diagnosis not present

## 2016-06-09 DIAGNOSIS — I872 Venous insufficiency (chronic) (peripheral): Secondary | ICD-10-CM | POA: Diagnosis not present

## 2016-06-09 DIAGNOSIS — I890 Lymphedema, not elsewhere classified: Secondary | ICD-10-CM | POA: Diagnosis not present

## 2016-06-09 DIAGNOSIS — E1122 Type 2 diabetes mellitus with diabetic chronic kidney disease: Secondary | ICD-10-CM | POA: Diagnosis not present

## 2016-06-09 DIAGNOSIS — E11622 Type 2 diabetes mellitus with other skin ulcer: Secondary | ICD-10-CM | POA: Diagnosis not present

## 2016-06-09 DIAGNOSIS — J449 Chronic obstructive pulmonary disease, unspecified: Secondary | ICD-10-CM | POA: Diagnosis not present

## 2016-06-10 NOTE — Progress Notes (Signed)
[name], [name] ([id number]) Visit Report for 06/09/2016 Chief Complaint Document Details Patient Name: [name]. Date of Service: 06/09/2016 3:30 PM Medical Record Number: [id number] Patient Account Number: [id number] Date of Birth/Sex: 02-24-1941 (76 y.o. Female) Treating RN: Montey Hora Primary Care Provider: Glendale Chard Other Clinician: Referring Provider: Glendale Chard Treating Provider/Extender: Frann Rider in Treatment: 18 Information Obtained from: Patient Chief Complaint [name] presents for evaluation of her left leg ulcer Electronic Signature(s) Signed: 06/09/2016 4:18:41 PM By: Christin Fudge MD, FACS Entered By: Christin Fudge on 06/09/2016 16:18:41 [name] ([id number]) -------------------------------------------------------------------------------- HPI Details Patient Name: [name]. Date of Service: 06/09/2016 3:30 PM Medical Record Number: [id number] Patient Account Number: [id number] Date of Birth/Sex: 09-20-1940 (76 y.o. Female) Treating RN: Montey Hora Primary Care Provider: Glendale Chard Other Clinician: Referring Provider: Glendale Chard Treating Provider/Extender: Frann Rider in Treatment: 18 History of Present Illness Location: left lateral lower extremity Quality: denies pain Severity: denies pain Duration: ulcer has been present for several months Timing: denies pain Context: traumatic injury HPI Description: 76 year old patient with a past medical history of diabetes mellitus, chronic kidney disease, COPD, hypertension, rheumatoid arthritis had a lacerated wound to her left calf when she injured it on the corner of a car door and it was a long 12 cm laceration. x-ray done at the time revealed that there was no fracture or radiopaque foreign body. the wound was sutured with 4-0 Prolene simple interrupted sutures and a total of 14 sutures were applied. on August 31 she was seen in the ER and it  was noted that the wound had dehisced and she was treated with Keflex. She has completed a course of doxycycline recently Medical history also significant for status post abdominal hysterectomy, brain surgery, cardiac catheterization, EGD and bronchoscopy. of late she was back in the ER and was seen last on August 31 and they tried Silvadene 02/22/16: pt reports discomfort and mild pain just below her wound. however, she admits on legs more than usual. no redness, foul odor or purulence. the wound is measuring slightly larger today, but likely secondary to poor edema control. nursing staff gave information to pt today regarding purchase of her compression stockings. she was encouraged to purchase soon. 02/29/16: returns today for f/u. she denies systemic s/s of infection. she brought her new compression stockings today. 03/21/2016 -- She has been put on an antibiotic and prednisone for a bronchitis by her pulmonologist. 04/04/2016 -- she continues to be on prednisone. 04-18-2016 [name] is for evaluation of her left lateral leg ulcer. She states that she is still on a titrating dose of prednisone. She also states that she recently received a steroid injection to her left knee. She states that her blood sugars have elevated slightly with the max reading in the 130s. She denies any pain or discomfort to her left leg ulcer. She states that she has been unable to wear her compression stockings and as a result has ankle increased edema to the left leg. 05/08/2016 - [name] returns today for evaluation of her left lateral leg ulcer. She states that she has not been able to wear her compression hose due to her arthritis and pain in her hands. She denies any issues with her wound. 05/16/2016 -- venous duplex studies which I had ordered have not been done yet and I have asked her to get in touch with the office. 05/23/2016 -- venous duplex studies was done today and we will await the  report. [name], [name] ([id number]) 05/30/2016 -- had a lower extremity venous duplex reflux evaluation which showed no evidence of DVT in the left femoral popliteal venous system. There was venous incompetence in the left saphenofemoral junction greater saphenous vein, common femoral vein and popliteal vein. Based on the above I would recommend a opinion from the vascular surgeons Electronic Signature(s) Signed: 06/09/2016 4:18:51 PM By: Christin Fudge MD, FACS Entered By: Christin Fudge on 06/09/2016 16:18:51 [name] ([id number]) -------------------------------------------------------------------------------- Physical Exam Details Patient Name: [name]. Date of Service: 06/09/2016 3:30 PM Medical Record Number: [id number] Patient Account Number: [id number] Date of Birth/Sex: Aug 08, 1940 (76 y.o. Female) Treating RN: Montey Hora Primary Care Provider: Glendale Chard Other Clinician: Referring Provider: Glendale Chard Treating Provider/Extender: Frann Rider in Treatment: 18 Constitutional . Pulse regular. Respirations normal and unlabored. Afebrile. . Eyes Nonicteric. Reactive to light. Ears, Nose, Mouth, and Throat Lips, teeth, and gums WNL.[name] Moist mucosa without lesions. Neck supple and nontender. No palpable supraclavicular or cervical adenopathy. Normal sized without goiter. Respiratory WNL. No retractions.. Breath sounds WNL, No rubs, rales, rhonchi, or wheeze.. Cardiovascular Heart rhythm and rate regular, no murmur or Mcdaniel.. Pedal Pulses WNL. No clubbing, cyanosis or edema. Chest Breasts symmetical and no nipple discharge.. Breast tissue WNL, no masses, lumps, or tenderness.. Gastrointestinal (GI) Abdomen without masses or tenderness.. No liver or spleen enlargement or tenderness.. Genitourinary (GU) No hydrocele, spermatocele, tenderness of the cord, or testicular mass.[name] Penis without lesions.[name] without lesions. No cystocele, or  rectocele. Pelvic support intact, no discharge.[name] Urethra without masses, tenderness or scarring.[name] Lymphatic No adneopathy. No adenopathy. No adenopathy. Musculoskeletal Adexa without tenderness or enlargement.. Digits and nails w/o clubbing, cyanosis, infection, petechiae, ischemia, or inflammatory conditions.. Integumentary (Hair, Skin) No suspicious lesions. No crepitus or fluctuance. No peri-wound warmth or erythema. No masses.[name] Psychiatric Judgement and insight Intact.. No evidence of depression, anxiety, or agitation.. Notes the wounds have completely healed. Electronic Signature(s) Signed: 06/09/2016 4:19:05 PM By: Christin Fudge MD, FACS [name] ([id number]) Entered By: Christin Fudge on 06/09/2016 16:19:04 [name] ([id number]) -------------------------------------------------------------------------------- Physician Orders Details Patient Name: [name] Date of Service: 06/09/2016 3:30 PM Medical Record Number: [id number] Patient Account Number: [id number] Date of Birth/Sex: 03-12-1941 (76 y.o. Female) Treating RN: Montey Hora Primary Care Provider: Glendale Chard Other Clinician: Referring Provider: Glendale Chard Treating Provider/Extender: Frann Rider in Treatment: 28 Verbal / Phone Orders: Yes Clinician: Montey Hora Read Back and Verified: Yes Diagnosis Coding Discharge From [facility] Services o Discharge from [facility] Signature(s) Signed: 06/09/2016 4:24:55 PM By: Christin Fudge MD, FACS Signed: 06/09/2016 4:53:16 PM By: Montey Hora Entered By: Montey Hora on 06/09/2016 16:07:23 [name] ([id number]) -------------------------------------------------------------------------------- Problem List Details Patient Name: [name], [name]. Date of Service: 06/09/2016 3:30 PM Medical Record Number: [id number] Patient Account Number: [id number] Date of Birth/Sex: July 26, 1940 (77 y.o. 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 Date of Service: 06/09/2016 3:30 PM Medical Record Number: [id number] Patient Account Number: [id number] Date of Birth/Sex: 08/26/1940 (76 y.o. Female) Treating RN: Montey Hora Primary Care Provider: Glendale Chard Other Clinician: Referring Provider: Glendale Chard Treating Provider/Extender: Frann Rider in Treatment: 18 Subjective Chief Complaint Information obtained from Patient Ms. Gent presents for evaluation of her left leg ulcer History of Present Illness (HPI) The following HPI elements were documented for the patient's wound: Location: left lateral lower extremity Quality: denies pain Severity: denies pain Duration: ulcer has been present for several months Timing: denies pain Context: traumatic injury 76 year old patient with a past medical history of diabetes mellitus, chronic kidney disease, COPD, hypertension, rheumatoid arthritis had a lacerated wound to her left calf when she injured it on the corner of a car door and it was a long 12 cm laceration. x-ray done at the time revealed that  there was no fracture or radiopaque foreign body. the wound was sutured with 4-0 Prolene simple interrupted sutures and a total of 14 sutures were applied. on August 31 she was seen in the ER and it was noted that the wound had dehisced and she was treated with Keflex. She has completed a course of doxycycline recently Medical history also significant for status post abdominal hysterectomy, brain surgery, cardiac catheterization, EGD and bronchoscopy. of late she was back in the ER and was seen last on August 31 and they tried Silvadene 02/22/16: pt reports discomfort and mild pain just below her wound. however, she admits on legs more than usual. no redness, foul odor or purulence. the wound is measuring slightly larger today, but likely secondary to poor edema control. nursing staff gave information to pt today regarding purchase of her compression stockings. she was encouraged to purchase soon. 02/29/16: returns today for f/u. she denies systemic s/s of infection. she brought her new compression stockings today. 03/21/2016 -- She has been put on an antibiotic and prednisone for a bronchitis by her pulmonologist. 04/04/2016 -- she continues to be on prednisone. 04-18-2016 Ms. Zertuche is for evaluation of her left lateral leg ulcer. She states that she is still on a titrating dose of prednisone. She also states that she recently received a steroid injection to her left knee. She states that her blood sugars have elevated slightly with the max reading in the 130s. She denies any pain or discomfort to her left leg ulcer. She states that she has been unable to wear her compression stockings and as a result has ankle increased edema to the left leg. [name], [name] ([id number]) 05/08/2016 - Ms. Neiswender returns today for evaluation of her left lateral leg ulcer. She states that she has not been able to wear her compression hose due to her arthritis and pain in her hands. She denies any issues  with her wound. 05/16/2016 -- venous duplex studies which I had ordered have not been done yet and I have asked her to get in touch with the office. 05/23/2016 -- venous duplex studies was done today and we will await the report. 05/30/2016 -- had a lower extremity venous duplex reflux evaluation which showed no evidence of DVT in the left femoral popliteal venous system. There was venous incompetence in the left saphenofemoral junction greater saphenous vein, common femoral vein and popliteal vein. Based on the above I would recommend a opinion from the vascular surgeons Objective Constitutional Pulse regular. Respirations normal and unlabored. Afebrile. Vitals Time Taken: 3:44 PM, Height: 64  in, Weight: 283 lbs, BMI: 48.6, Temperature: 98.1 F, Pulse: 101 bpm, Respiratory Rate: 16 breaths/min, Blood Pressure: 141/76 mmHg. Eyes Nonicteric. Reactive to light. Ears, Nose, Mouth, and Throat Lips, teeth, and gums WNL.[name] Moist mucosa without lesions. Neck supple and nontender. No palpable supraclavicular or cervical adenopathy. Normal sized without goiter. Respiratory WNL. No retractions.. Breath sounds WNL, No rubs, rales, rhonchi, or wheeze.. Cardiovascular Heart rhythm and rate regular, no murmur or Mcdaniel.. Pedal Pulses WNL. No clubbing, cyanosis or edema. Chest Breasts symmetical and no nipple discharge.. Breast tissue WNL, no masses, lumps, or tenderness.. Gastrointestinal (GI) Abdomen without masses or tenderness.. No liver or spleen enlargement or tenderness.. Genitourinary (GU) Vertz, Alexine E. ([id number]) No hydrocele, spermatocele, tenderness of the cord, or testicular mass.[name] Penis without lesions.[name] without lesions. No cystocele, or rectocele. Pelvic support intact, no discharge.[name] Urethra without masses, tenderness or scarring.[name] Lymphatic No adneopathy. No adenopathy. No adenopathy. Musculoskeletal Adexa without tenderness or enlargement.. Digits and nails w/o  clubbing, cyanosis, infection, petechiae, ischemia, or inflammatory conditions.[name] Psychiatric Judgement and insight Intact.. No evidence of depression, anxiety, or agitation.. General Notes: the wounds have completely healed. Integumentary (Hair, Skin) No suspicious lesions. No crepitus or fluctuance. No peri-wound warmth or erythema. No masses.. Wound #1 status is Open. Original cause of wound was Trauma. The wound is located on the Left,Lateral Lower Leg. The wound measures 0cm length x 0cm width x 0cm depth; 0cm^2 area and 0cm^3 volume. 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 ([id number]) Her wounds have healed and I have discharged her from the wound care services. I had ordered a formal venous reflux study and this was done today at vein and vascular at [facility] -- report is reviewed with her and she will require vascular surgeons opinion regarding endovenous ablation I have told her to aggressively elevate and exercise to reduce her lymphedema. Electronic Signature(s) Signed: 06/09/2016 4:20:07 PM By: Christin Fudge MD, FACS Entered By: Christin Fudge on 06/09/2016 16:20:07 [name] ([id number]) -------------------------------------------------------------------------------- SuperBill Details Patient Name: [name]. Date of Service: 06/09/2016 Medical Record Number: [id number] Patient Account Number: [id number] Date of Birth/Sex: 01-01-1941 (76 y.o. 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 Facility Procedures CPT4 Code: [id number] Description: [phone] - WOUND CARE VISIT-LEV 2 EST PT Modifier: Quantity: 1 Physician Procedures CPT4 Code: [id number] Description: [id number] - WC PHYS LEVEL 3 - EST PT ICD-10 Description Diagnosis E11.622 Type 2 diabetes mellitus with other skin ulcer L97.222 Non-pressure chronic ulcer of left calf with fat I89.0 Lymphedema, not elsewhere classified I83.022 Varicose veins  of left lower extremity with ulcer Modifier: layer exposed of calf Quantity: 1 Electronic Signature(s) Signed: 06/09/2016 4:41:02 PM By: Montey Hora Previous Signature: 06/09/2016 4:20:22 PM Version By: Christin Fudge MD, FACS Entered By: Montey Hora on 06/09/2016 16:41:02

## 2016-06-10 NOTE — Progress Notes (Signed)
MARKIE, HEFFERNAN ([id number]) Visit Report for 06/09/2016 Arrival Information Details Patient Name: [name], PORE. Date of Service: 06/09/2016 3:30 PM Medical Record Number: [id number] Patient Account Number: [id number] Date of Birth/Sex: [birth year] (76 y.o. Female) Treating RN: Montey Hora Primary Care Norah Devin: Glendale Chard Other Clinician: Referring Desree Leap: Glendale Chard Treating Essex Perry/Extender: Frann Rider in Treatment: 18 Visit Information History Since Last Visit Added or deleted any medications: No Patient Arrived: Walker Any new allergies or adverse reactions: No Arrival Time: 15:43 Had a fall or experienced change in No Accompanied By: self activities of daily living that may affect Transfer Assistance: None risk of falls: Patient Identification Verified: Yes Signs or symptoms of abuse/neglect since last No Secondary Verification Process Completed: Yes visito Patient Has Alerts: Yes Hospitalized since last visit: No Patient Alerts: DM II Has Dressing in Place as Prescribed: Yes Pain Present Now: No Electronic Signature(s) Signed: 06/09/2016 4:53:16 PM By: Montey Hora Entered By: Montey Hora on 06/09/2016 15:43:20 [name] ([id number]) -------------------------------------------------------------------------------- Clinic Level of Care Assessment Details Patient Name: [name] Date of Service: 06/09/2016 3:30 PM Medical Record Number: [id number] Patient Account Number: [id number] Date of Birth/Sex: July 08, 1940 (76 y.o. Female) Treating RN: Montey Hora Primary Care Deon Duer: Glendale Chard Other Clinician: Referring Ilyana Manuele: Glendale Chard Treating Annamaria Salah/Extender: Frann Rider in Treatment: 18 Clinic Level of Care Assessment Items TOOL 4 Quantity Score _0  - Use when only an EandM is performed on FOLLOW-UP visit 0 ASSESSMENTS - Nursing Assessment / Reassessment X - Reassessment of Co-morbidities  (includes updates in patient status) 1 10 X - Reassessment of Adherence to Treatment Plan 1 5 ASSESSMENTS - Wound and Skin Assessment / Reassessment X - Simple Wound Assessment / Reassessment - one wound 1 5 _1  - Complex Wound Assessment / Reassessment - multiple wounds 0 _2  - Dermatologic / Skin Assessment (not related to wound area) 0 ASSESSMENTS - Focused Assessment X - Circumferential Edema Measurements - multi extremities 1 5 _3  - Nutritional Assessment / Counseling / Intervention 0 X - Lower Extremity Assessment (monofilament, tuning fork, pulses) 1 5 _4  - Peripheral Arterial Disease Assessment (using hand held doppler) 0 ASSESSMENTS - Ostomy and/or Continence Assessment and Care _5  - Incontinence Assessment and Management 0 _6  - Ostomy Care Assessment and Management (repouching, etc.) 0 PROCESS - Coordination of Care X - Simple Patient / Family Education for ongoing care 1 15 _7  - Complex (extensive) Patient / Family Education for ongoing care 0 _8  - Staff obtains Programmer, systems, Records, Test Results / Process Orders 0 _9  - Staff telephones HHA, Nursing Homes / Clarify orders / etc 0 _10  - Routine Transfer to another Facility (non-emergent condition) 0 Dungan, Jonetta E. ([id number]) _11  - Routine Hospital Admission (non-emergent condition) 0 _12  - New Admissions / Biomedical engineer / Ordering NPWT, Apligraf, etc. 0 _13  - Emergency Hospital Admission (emergent condition) 0 X - Simple Discharge Coordination 1 10 _14  - Complex (extensive) Discharge Coordination 0 PROCESS - Special Needs _15  - Pediatric / Minor Patient Management 0 _16  - Isolation Patient Management 0 _17  - Hearing / Language / Visual special needs 0 _18  - Assessment of Community assistance (transportation, D/C planning, etc.) 0 _19  - Additional assistance / Altered mentation 0 _20  - Support Surface(s) Assessment (bed, cushion, seat, etc.) 0 INTERVENTIONS - Wound Cleansing / Measurement X - Simple Wound Cleansing - one  wound 1 5 _21  - Complex Wound Cleansing - multiple wounds 0 X - Wound Imaging (photographs - any number of wounds) 1 5 _22  -  Wound Tracing (instead of photographs) 0 X - Simple Wound Measurement - one wound 1 5 _0  - Complex Wound Measurement - multiple wounds 0 INTERVENTIONS - Wound Dressings _1  - Small Wound Dressing one or multiple wounds 0 _2  - Medium Wound Dressing one or multiple wounds 0 _3  - Large Wound Dressing one or multiple wounds 0 <ZOXWRUEAVWUJWJXB>_1<\/YNWGNFAOZHYQMVHQ>_4  - Application of Medications - topical 0 <ONGEXBMWUXLKGMWN>_0<\/UVOZDGUYQIHKVQQV>_9  - Application of Medications - injection 0 INTERVENTIONS - Miscellaneous _6  - External ear exam 0 Linford, Christinamarie E. ([id number]) _7  - Specimen Collection (cultures, biopsies, blood, body fluids, etc.) 0 _8  - Specimen(s) / Culture(s) sent or taken to Lab for analysis 0 _9  - Patient Transfer (multiple staff / Harrel Lemon Lift / Similar devices) 0 _10  - Simple Staple / Suture removal (25 or less) 0 _11  - Complex Staple / Suture removal (26 or more) 0 _12  - Hypo / Hyperglycemic Management (close monitor of Blood Glucose) 0 _13  - Ankle / Brachial Index (ABI) - do not check if billed separately 0 X - Vital Signs 1 5 Has the patient been seen at the hospital within the last three years: Yes Total Score: 75 Level Of Care: New/Established - Level 2 Electronic Signature(s) Signed: 06/09/2016 4:53:16 PM By: Montey Hora Entered By: Montey Hora on 06/09/2016 16:40:12 [name] ([id number]) -------------------------------------------------------------------------------- Encounter Discharge Information Details Patient Name: [name]. Date of Service: 06/09/2016 3:30 PM Medical Record Number: [id number] Patient Account Number: [id number] Date of Birth/Sex: 28-Jul-1940 (76 y.o. Female) Treating RN: Montey Hora Primary Care Arizbeth Cawthorn: Glendale Chard Other Clinician: Referring Brittania Sudbeck: Glendale Chard Treating Kashaun Bebo/Extender: Frann Rider in Treatment: 20 Encounter Discharge Information  Items Discharge Pain Level: 0 Discharge Condition: Stable Ambulatory Status: Walker Discharge Destination: Home Transportation: Private Auto Accompanied By: self Schedule Follow-up Appointment: No Medication Reconciliation completed No and provided to Patient/Care Nyelle Wolfson: Provided on Clinical Summary of Care: 06/09/2016 Form Type Recipient Paper Patient DG Electronic Signature(s) Signed: 06/09/2016 4:42:23 PM By: Montey Hora Previous Signature: 06/09/2016 4:12:13 PM Version By: Ruthine Dose Entered By: Montey Hora on 06/09/2016 16:42:22 [name] ([id number]) -------------------------------------------------------------------------------- Lower Extremity Assessment Details Patient Name: [name]. Date of Service: 06/09/2016 3:30 PM Medical Record Number: [id number] Patient Account Number: [id number] Date of Birth/Sex: Oct 04, 1940 (77 y.o. Female) Treating RN: Montey Hora Primary Care Linzee Depaul: Glendale Chard Other Clinician: Referring Jatorian Renault: Glendale Chard Treating Josalynn Johndrow/Extender: Frann Rider in Treatment: 18 Edema Assessment Assessed: [Left: No] [Right: No] Edema: [Left: Ye] [Right: s] Calf Left: Right: Point of Measurement: 34 cm From Medial Instep 46.2 cm cm Ankle Left: Right: Point of Measurement: 11 cm From Medial Instep 31.1 cm cm Vascular Assessment Pulses: Dorsalis Pedis Palpable: [Left:Yes] Posterior Tibial Extremity colors, hair growth, and conditions: Extremity Color: [Left:Hyperpigmented] Hair Growth on Extremity: [Left:No] Temperature of Extremity: [Left:Warm] Capillary Refill: [Left:< 3 seconds] Electronic Signature(s) Signed: 06/09/2016 4:53:16 PM By: Montey Hora Entered By: Montey Hora on 06/09/2016 15:50:07 [name] ([id number]) -------------------------------------------------------------------------------- Multi Wound Chart Details Patient Name: [name]. Date of Service:  06/09/2016 3:30 PM Medical Record Number: [id number] Patient Account Number: [id number] Date of Birth/Sex: 1941-04-29 (76 y.o. Female) Treating RN: Montey Hora Primary Care Anayi Bricco: Glendale Chard Other Clinician: Referring Fatema Rabe: Glendale Chard Treating Angelica Wix/Extender: Frann Rider in Treatment: 18 Vital Signs Height(in): 64 Pulse(bpm): 101 Weight(lbs): 283 Blood Pressure 141/76 (mmHg): Body Mass Index(BMI): 49 Temperature(F): 98.1 Respiratory Rate 16 (breaths/min): Photos: [N/A:N/A] Wound Location: Left, Lateral Lower Leg N/A N/A Wounding Event: Trauma N/A N/A Primary Etiology: Diabetic Wound/Ulcer of  N/A N/A the Lower Extremity Secondary Etiology: Auto-immune N/A N/A Date Acquired: 12/27/2015 N/A N/A Weeks of Treatment: 18 N/A N/A Wound Status: Open N/A N/A Measurements L x W x D 0x0x0 N/A N/A (cm) Area (cm) : 0 N/A N/A Volume (cm) : 0 N/A N/A % Reduction in Area: 100.00% N/A N/A % Reduction in Volume: 100.00% N/A N/A Classification: Grade 2 N/A N/A Periwound Skin Texture: No Abnormalities Noted N/A N/A Periwound Skin No Abnormalities Noted N/A N/A Moisture: Periwound Skin Color: No Abnormalities Noted N/A N/A Tenderness on No N/A N/A Palpation: Treatment Notes [name], [name] ([id number]) Electronic Signature(s) Signed: 06/09/2016 4:18:29 PM By: Christin Fudge MD, FACS Entered By: Christin Fudge on 06/09/2016 16:18:28 [name] ([id number]) -------------------------------------------------------------------------------- [place] Details Patient Name: [name]. Date of Service: 06/09/2016 3:30 PM Medical Record Number: [id number] Patient Account Number: [id number] Date of Birth/Sex: [birth year] (76 y.o. Female) Treating RN: Montey Hora Primary Care Danzig Macgregor: Glendale Chard Other Clinician: Referring [name] Ilyas: Glendale Chard Treating Tomie Spizzirri/Extender: Frann Rider in Treatment: 18 Active  Inactive Electronic Signature(s) Signed: 06/09/2016 4:53:16 PM By: Montey Hora Entered By: Montey Hora on 06/09/2016 16:07:09 [name] ([id number]) -------------------------------------------------------------------------------- Pain Assessment Details Patient Name: [name]. Date of Service: 06/09/2016 3:30 PM Medical Record Number: [id number] Patient Account Number: [id number] Date of Birth/Sex: 1941/03/31 (76 y.o. Female) Treating RN: Montey Hora Primary Care Chayil Gantt: Glendale Chard Other Clinician: Referring Rayon Mcchristian: Glendale Chard Treating Chiara Coltrin/Extender: Frann Rider in Treatment: 18 Active Problems Location of Pain Severity and Description of Pain Patient Has Paino Yes Site Locations Pain Location: Generalized Pain With Dressing Change: No Pain Management and Medication Current Pain Management: Notes Topical or injectable lidocaine is offered to patient for acute pain when surgical debridement is performed. If needed, Patient is instructed to use over the counter pain medication for the following 24-48 hours after debridement. Wound care MDs do not prescribed pain medications. Patient has chronic pain or uncontrolled pain. Patient has been instructed to make an appointment with their Primary Care Physician for pain management. Electronic Signature(s) Signed: 06/09/2016 4:53:16 PM By: Montey Hora Entered By: Montey Hora on 06/09/2016 15:43:34 [name] ([id number]) -------------------------------------------------------------------------------- Patient/Caregiver Education Details Patient Name: [name] Date of Service: 06/09/2016 3:30 PM Medical Record Number: [id number] Patient Account Number: [id number] Date of Birth/Gender: 03-May-1941 (76 y.o. Female) Treating RN: Montey Hora Primary Care Physician: Glendale Chard Other Clinician: Referring Physician: Glendale Chard Treating Physician/Extender:  Frann Rider in Treatment: 65 Education Assessment Education Provided To: Patient Education Topics Provided Basic Hygiene: Handouts: Other: care of newly healed ulcer site Methods: Explain/Verbal Responses: State content correctly Electronic Signature(s) Signed: 06/09/2016 4:53:16 PM By: Montey Hora Entered By: Montey Hora on 06/09/2016 16:42:44 [name], Keirstan E. ([id number]) -------------------------------------------------------------------------------- Wound Assessment Details Patient Name: [name]. Date of Service: 06/09/2016 3:30 PM Medical Record Number: [id number] Patient Account Number: [id number] Date of Birth/Sex: 1940/08/17 (76 y.o. Female) Treating RN: Montey Hora Primary Care Devynn Hessler: Glendale Chard Other Clinician: Referring Juelz Whittenberg: Glendale Chard Treating Anihya Tuma/Extender: Frann Rider in Treatment: 18 Wound Status Wound Number: 1 Primary Etiology: Diabetic Wound/Ulcer of the Lower Extremity Wound Location: Left, Lateral Lower Leg Secondary Auto-immune Wounding Event: Trauma Etiology: Date Acquired: 12/27/2015 Wound Status: Open Weeks Of Treatment: 18 Clustered Wound: No Photos Photo Uploaded By: Montey Hora on 06/09/2016 16:12:58 Wound Measurements Length: (cm) 0 % Reduction i Width: (cm) 0 % Reduction i Depth: (cm) 0 Area: (cm) 0 Volume: (cm) 0 n Area: 100% n Volume:  100% Wound Description Classification: Grade 2 Periwound Skin Texture Texture Color No Abnormalities Noted: No No Abnormalities Noted: No Moisture No Abnormalities Noted: No Electronic Signature(s) Signed: 06/09/2016 4:53:16 PM By: Melody Haver, Edwena Felty ([id number]) Entered By: Montey Hora on 06/09/2016 15:48:41 [name] ([id number]) -------------------------------------------------------------------------------- Vitals Details Patient Name: [name]. Date of Service: 06/09/2016 3:30 PM Medical  Record Number: [id number] Patient Account Number: [id number] Date of Birth/Sex: 04/01/1941 (76 y.o. Female) Treating RN: Montey Hora Primary Care Egan Sahlin: Glendale Chard Other Clinician: Referring Chizara Mena: Glendale Chard Treating Darenda Fike/Extender: Frann Rider in Treatment: 18 Vital Signs Time Taken: 15:44 Temperature (F): 98.1 Height (in): 64 Pulse (bpm): 101 Weight (lbs): 283 Respiratory Rate (breaths/min): 16 Body Mass Index (BMI): 48.6 Blood Pressure (mmHg): 141/76 Reference Range: 80 - 120 mg / dl Electronic Signature(s) Signed: 06/09/2016 4:53:16 PM By: Montey Hora Entered By: Montey Hora on 06/09/2016 15:46:20

## 2016-06-11 DIAGNOSIS — I130 Hypertensive heart and chronic kidney disease with heart failure and stage 1 through stage 4 chronic kidney disease, or unspecified chronic kidney disease: Secondary | ICD-10-CM | POA: Diagnosis not present

## 2016-06-11 DIAGNOSIS — E1122 Type 2 diabetes mellitus with diabetic chronic kidney disease: Secondary | ICD-10-CM | POA: Diagnosis not present

## 2016-06-11 DIAGNOSIS — N182 Chronic kidney disease, stage 2 (mild): Secondary | ICD-10-CM | POA: Diagnosis not present

## 2016-06-11 DIAGNOSIS — T798XXD Other early complications of trauma, subsequent encounter: Secondary | ICD-10-CM | POA: Diagnosis not present

## 2016-06-11 DIAGNOSIS — S81812D Laceration without foreign body, left lower leg, subsequent encounter: Secondary | ICD-10-CM | POA: Diagnosis not present

## 2016-06-11 DIAGNOSIS — I5032 Chronic diastolic (congestive) heart failure: Secondary | ICD-10-CM | POA: Diagnosis not present

## 2016-06-12 ENCOUNTER — Other Ambulatory Visit: Payer: Self-pay | Admitting: *Deleted

## 2016-06-13 DIAGNOSIS — T798XXD Other early complications of trauma, subsequent encounter: Secondary | ICD-10-CM | POA: Diagnosis not present

## 2016-06-13 DIAGNOSIS — N182 Chronic kidney disease, stage 2 (mild): Secondary | ICD-10-CM | POA: Diagnosis not present

## 2016-06-13 DIAGNOSIS — S81812D Laceration without foreign body, left lower leg, subsequent encounter: Secondary | ICD-10-CM | POA: Diagnosis not present

## 2016-06-13 DIAGNOSIS — I130 Hypertensive heart and chronic kidney disease with heart failure and stage 1 through stage 4 chronic kidney disease, or unspecified chronic kidney disease: Secondary | ICD-10-CM | POA: Diagnosis not present

## 2016-06-13 DIAGNOSIS — I5032 Chronic diastolic (congestive) heart failure: Secondary | ICD-10-CM | POA: Diagnosis not present

## 2016-06-13 DIAGNOSIS — E1122 Type 2 diabetes mellitus with diabetic chronic kidney disease: Secondary | ICD-10-CM | POA: Diagnosis not present

## 2016-06-16 ENCOUNTER — Encounter: Payer: Self-pay | Admitting: Vascular Surgery

## 2016-06-17 ENCOUNTER — Encounter: Payer: Self-pay | Admitting: Registered Nurse

## 2016-06-17 ENCOUNTER — Encounter: Payer: Medicare Other | Attending: Physical Medicine & Rehabilitation | Admitting: Registered Nurse

## 2016-06-17 VITALS — BP 122/75 | HR 84

## 2016-06-17 DIAGNOSIS — Z8051 Family history of malignant neoplasm of kidney: Secondary | ICD-10-CM | POA: Insufficient documentation

## 2016-06-17 DIAGNOSIS — J8410 Pulmonary fibrosis, unspecified: Secondary | ICD-10-CM | POA: Diagnosis not present

## 2016-06-17 DIAGNOSIS — E1122 Type 2 diabetes mellitus with diabetic chronic kidney disease: Secondary | ICD-10-CM | POA: Diagnosis not present

## 2016-06-17 DIAGNOSIS — J449 Chronic obstructive pulmonary disease, unspecified: Secondary | ICD-10-CM | POA: Insufficient documentation

## 2016-06-17 DIAGNOSIS — M1712 Unilateral primary osteoarthritis, left knee: Secondary | ICD-10-CM

## 2016-06-17 DIAGNOSIS — M7062 Trochanteric bursitis, left hip: Secondary | ICD-10-CM

## 2016-06-17 DIAGNOSIS — Z808 Family history of malignant neoplasm of other organs or systems: Secondary | ICD-10-CM | POA: Insufficient documentation

## 2016-06-17 DIAGNOSIS — Z801 Family history of malignant neoplasm of trachea, bronchus and lung: Secondary | ICD-10-CM | POA: Diagnosis not present

## 2016-06-17 DIAGNOSIS — M170 Bilateral primary osteoarthritis of knee: Secondary | ICD-10-CM | POA: Diagnosis not present

## 2016-06-17 DIAGNOSIS — M545 Low back pain: Secondary | ICD-10-CM | POA: Diagnosis not present

## 2016-06-17 DIAGNOSIS — D689 Coagulation defect, unspecified: Secondary | ICD-10-CM | POA: Insufficient documentation

## 2016-06-17 DIAGNOSIS — Z833 Family history of diabetes mellitus: Secondary | ICD-10-CM | POA: Insufficient documentation

## 2016-06-17 DIAGNOSIS — Z9889 Other specified postprocedural states: Secondary | ICD-10-CM | POA: Diagnosis not present

## 2016-06-17 DIAGNOSIS — F329 Major depressive disorder, single episode, unspecified: Secondary | ICD-10-CM | POA: Insufficient documentation

## 2016-06-17 DIAGNOSIS — R5382 Chronic fatigue, unspecified: Secondary | ICD-10-CM

## 2016-06-17 DIAGNOSIS — E1140 Type 2 diabetes mellitus with diabetic neuropathy, unspecified: Secondary | ICD-10-CM

## 2016-06-17 DIAGNOSIS — E7800 Pure hypercholesterolemia, unspecified: Secondary | ICD-10-CM | POA: Diagnosis not present

## 2016-06-17 DIAGNOSIS — M48062 Spinal stenosis, lumbar region with neurogenic claudication: Secondary | ICD-10-CM | POA: Insufficient documentation

## 2016-06-17 DIAGNOSIS — M2550 Pain in unspecified joint: Secondary | ICD-10-CM

## 2016-06-17 DIAGNOSIS — M62838 Other muscle spasm: Secondary | ICD-10-CM

## 2016-06-17 DIAGNOSIS — R200 Anesthesia of skin: Secondary | ICD-10-CM | POA: Diagnosis not present

## 2016-06-17 DIAGNOSIS — M069 Rheumatoid arthritis, unspecified: Secondary | ICD-10-CM | POA: Diagnosis not present

## 2016-06-17 DIAGNOSIS — N189 Chronic kidney disease, unspecified: Secondary | ICD-10-CM | POA: Insufficient documentation

## 2016-06-17 DIAGNOSIS — E1142 Type 2 diabetes mellitus with diabetic polyneuropathy: Secondary | ICD-10-CM | POA: Diagnosis not present

## 2016-06-17 DIAGNOSIS — Z8249 Family history of ischemic heart disease and other diseases of the circulatory system: Secondary | ICD-10-CM | POA: Insufficient documentation

## 2016-06-17 DIAGNOSIS — M47816 Spondylosis without myelopathy or radiculopathy, lumbar region: Secondary | ICD-10-CM

## 2016-06-17 DIAGNOSIS — I129 Hypertensive chronic kidney disease with stage 1 through stage 4 chronic kidney disease, or unspecified chronic kidney disease: Secondary | ICD-10-CM | POA: Diagnosis not present

## 2016-06-17 DIAGNOSIS — Z803 Family history of malignant neoplasm of breast: Secondary | ICD-10-CM | POA: Insufficient documentation

## 2016-06-17 DIAGNOSIS — M1711 Unilateral primary osteoarthritis, right knee: Secondary | ICD-10-CM

## 2016-06-17 DIAGNOSIS — M1288 Other specific arthropathies, not elsewhere classified, other specified site: Secondary | ICD-10-CM | POA: Diagnosis not present

## 2016-06-17 DIAGNOSIS — E079 Disorder of thyroid, unspecified: Secondary | ICD-10-CM | POA: Insufficient documentation

## 2016-06-17 DIAGNOSIS — G894 Chronic pain syndrome: Secondary | ICD-10-CM

## 2016-06-17 MED ORDER — MORPHINE SULFATE 15 MG PO TABS: 15.0000 mg | ORAL_TABLET | Freq: Three times a day (TID) | ORAL | 0 refills | Status: DC | PRN

## 2016-06-17 MED ORDER — CYCLOBENZAPRINE HCL 5 MG PO TABS: 5.0000 mg | ORAL_TABLET | Freq: Every evening | ORAL | 1 refills | Status: DC | PRN

## 2016-06-17 MED ORDER — DULOXETINE HCL 60 MG PO CPEP: 60.0000 mg | ORAL_CAPSULE | Freq: Every day | ORAL | 4 refills | Status: DC

## 2016-06-17 MED ORDER — GABAPENTIN 600 MG PO TABS: ORAL_TABLET | ORAL | 0 refills | Status: DC

## 2016-06-17 MED ORDER — MORPHINE SULFATE ER 15 MG PO TBCR: 15.0000 mg | EXTENDED_RELEASE_TABLET | Freq: Two times a day (BID) | ORAL | 0 refills | Status: DC

## 2016-06-17 NOTE — Progress Notes (Signed)
Subjective:    Patient ID: [name], female    DOB: [birth year], 76 y.o.   MRN: [id number]  HPI: [name] is a 76 year old female who returns for follow up appointment for chronic pain and medication refill. She states her pain is located in her lower back radiating into her left hip and left lower extremity laterally,bilateral knee pain, bilateral foot pain. Also states she's having joint pain.She ratesher pain 5. Her current exercise regime is performing chair exercises and walking.    Pain Inventory Average Pain 6 Pain Right Now 5 My pain is sharp and burning  In the last 24 hours, has pain interfered with the following? General activity 7 Relation with others 7 Enjoyment of life 7 What TIME of day is your pain at its worst? morning Sleep (in general) Fair  Pain is worse with: walking, bending, standing and some activites Pain improves with: rest and medication Relief from Meds: 4  Mobility use a cane use a walker ability to climb steps?  no  Function retired  Neuro/Psych bladder control problems tingling trouble walking spasms  Prior Studies Any changes since last visit?  no  Physicians involved in your care Any changes since last visit?  no   Family History  Problem Relation Age of Onset  . Diabetes Mother   . Heart attack Mother   . Hypertension Father   . Lung cancer Father   . Diabetes Sister   . Diabetes Brother   . Hypertension Brother   . Heart disease Brother   . Heart attack Brother   . Kidney cancer Brother   . Uterine cancer Daughter   . Breast cancer Sister   . Rheum arthritis Maternal Uncle   . Gout Brother   . Kidney failure Brother     x 5   Social History   Social History  . Marital status: Married    Spouse name: N/A  . Number of children: 6  . Years of education: college   Occupational History  . retired Marine scientist    Social History Main Topics  . Smoking status: Never Smoker  . Smokeless tobacco:  Never Used  . Alcohol use No  . Drug use: No  . Sexual activity: Not Currently   Other Topics Concern  . Not on file   Social History Narrative   Patient consumes 2-3 cups coffee per day.   Past Surgical History:  Procedure Laterality Date  . ABDOMINAL HYSTERECTOMY    . BRAIN SURGERY     Gamma knife 10/13. Needs repeat spring  '14  . CARDIAC CATHETERIZATION N/A 10/31/2014   Procedure: Right/Left Heart Cath and Coronary Angiography;  Surgeon: [name], MD;  Location: [facility];  Service: Cardiovascular;  Laterality: N/A;  . ESOPHAGOGASTRODUODENOSCOPY (EGD) WITH PROPOFOL N/A 09/14/2014   Procedure: ESOPHAGOGASTRODUODENOSCOPY (EGD) WITH PROPOFOL;  Surgeon: [name], MD;  Location: [facility];  Service: Endoscopy;  Laterality: N/A;  . OVARY SURGERY    . SHOULDER SURGERY Left   . TONSILLECTOMY  age 38  . VIDEO BRONCHOSCOPY Bilateral 05/31/2013   Procedure: VIDEO BRONCHOSCOPY WITHOUT FLUORO;  Surgeon: Brand Males, MD;  Location: [facility];  Service: Cardiopulmonary;  Laterality: Bilateral;  . video bronscoscopy  2000   lung   Past Medical History:  Diagnosis Date  . Arthritis    endstage changes bilateral knees/bilateral ankles.   . Asthma   . Carotid artery occlusion   . Chronic kidney disease   .  Clotting disorder ([place])    pt denies this  . Contusion of left knee    due to fall 1/14.  [name] COPD (chronic obstructive pulmonary disease) (HCC)    pulmonary fibrosis  . Depression, reactive   . Diabetes mellitus   . Diastolic dysfunction   . Family history of heart disease   . High cholesterol   . Hypertension   . Interstitial lung disease ([place])   . 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    . Morbid obesity ([place])   . Normal coronary arteries    cardiac catheterization performed  10/31/14  . RA (rheumatoid arthritis) ([place])    has been off methotreaxte  since 10/13.  [name] Shortness of breath   . Thyroid disease   . URI (upper respiratory infection)    There were no vitals taken for this visit.  Opioid Risk Score:   Fall Risk Score:  `1  Depression screen PHQ 2/9  Depression screen [facility] 2/9 08/09/2015 01/05/2015 09/20/2014 08/10/2013  Decreased Interest _0 -  Down, Depressed, Hopeless _1 PHQ - 2 Score _2 Altered sleeping - 2 0 -  Tired, decreased energy - 2 3 -  Change in appetite - 2 3 -  Feeling bad or failure about yourself  - 0 3 -  Trouble concentrating - 1 1 -  Moving slowly or fidgety/restless - 0 2 -  Suicidal thoughts - 0 0 -  PHQ-9 Score - 10 16 -  Difficult doing work/chores - Somewhat difficult - -  Some recent data might be hidden   Review of Systems  Constitutional: Negative.   HENT: Negative.   Eyes: Negative.   Respiratory: Negative.   Cardiovascular: Negative.   Gastrointestinal: Negative.   Endocrine: Negative.   Genitourinary: Positive for difficulty urinating.  Musculoskeletal: Positive for arthralgias, back pain and gait problem.  Skin: Negative.   Allergic/Immunologic: Negative.   Hematological: Negative.   Psychiatric/Behavioral: Negative.   All other systems reviewed and are negative.      Objective:   Physical Exam  Constitutional: She is oriented to person, place, and time. She appears well-developed and well-nourished.  HENT:  Head: Normocephalic and atraumatic.  Neck: Normal range of motion. Neck supple.  Cardiovascular: Normal rate and regular rhythm.   Pulmonary/Chest: Effort normal and breath sounds normal.  Musculoskeletal:  Normal Muscle Bulk and Muscle Testing Reveals: Upper Extremities: Full ROM and Muscle Strength 5/5 Thoracic Paraspinal Tenderness: T-1-T-3  T-7-T-9 Lumbar Hypersensitivity Lower Extremities: Decreased ROM and Muscle Strength 5/5 Bilateral Lower Extremities Flexion Produces Pain into Bilateral Patella's Left Lower Extremity Flexion Produces Pain into  Popliteal Fossa Arises from Table Slowly using Walker for Support Antalgic gait   Neurological: She is alert and oriented to person, place, and time.  Skin: Skin is warm and dry.  Psychiatric: She has a normal mood and affect.  Nursing note and vitals reviewed.         Assessment & Plan:  1. Lumbar spinal stenosis with neurogenic claudication. Associated facet arthropathy: Continue Gabapentinand HEP as tolerated. Refilled : MS Contin 15 mg one tablet every 12 hours #60 andcontinueMSIR 15 mg 1 one tablet every 8hours as needed #90 We will continue the opioid monitoring program, this consists of regular clinic visits, examinations, urine drug screen, pill counts as well as use of [place] Controlled Substance reporting System. 2. Depression:  Continue Cymbalta 3. Diabetes mellitus type 2 with polyneuropathy: Continue Gabapentin 4. Rheumatoid arthritis and osteoarthritis. Contnue Voltaren Gel 5. Interstitial lung disease: Continuous Oxygen and Pulmonology Following. 6. Bilateral Osteoarthritis Knee's: Continue Voltaren Gel 7. Muscle Spasm: Continue : Flexeril 8. Left Greater Trochanteric tenderness: Continue with Ice/Heat Therapy 9. Polyarthralgia: Continue current plan: Continue to monitor  30 minutes of face to face patient care time was spent during this visit. All questions were encouraged and answered.  F/U in 1 month

## 2016-06-18 ENCOUNTER — Telehealth: Payer: Self-pay | Admitting: Registered Nurse

## 2016-06-18 DIAGNOSIS — N182 Chronic kidney disease, stage 2 (mild): Secondary | ICD-10-CM | POA: Diagnosis not present

## 2016-06-18 DIAGNOSIS — T798XXD Other early complications of trauma, subsequent encounter: Secondary | ICD-10-CM | POA: Diagnosis not present

## 2016-06-18 DIAGNOSIS — I5032 Chronic diastolic (congestive) heart failure: Secondary | ICD-10-CM | POA: Diagnosis not present

## 2016-06-18 DIAGNOSIS — E1122 Type 2 diabetes mellitus with diabetic chronic kidney disease: Secondary | ICD-10-CM | POA: Diagnosis not present

## 2016-06-18 DIAGNOSIS — I130 Hypertensive heart and chronic kidney disease with heart failure and stage 1 through stage 4 chronic kidney disease, or unspecified chronic kidney disease: Secondary | ICD-10-CM | POA: Diagnosis not present

## 2016-06-18 DIAGNOSIS — S81812D Laceration without foreign body, left lower leg, subsequent encounter: Secondary | ICD-10-CM | POA: Diagnosis not present

## 2016-06-18 DIAGNOSIS — R531 Weakness: Secondary | ICD-10-CM | POA: Diagnosis not present

## 2016-06-18 NOTE — Telephone Encounter (Signed)
On January 31,2018 the [place] was reviewed no conflict was seen on the [facility] with multiple prescribers. Ms. Belzer has a signed narcotic contract with our office. If there were any discrepancies this would have been reported to her physician.

## 2016-06-19 ENCOUNTER — Encounter: Payer: Self-pay | Admitting: Vascular Surgery

## 2016-06-19 ENCOUNTER — Ambulatory Visit (INDEPENDENT_AMBULATORY_CARE_PROVIDER_SITE_OTHER): Payer: Medicare Other | Admitting: Vascular Surgery

## 2016-06-19 VITALS — BP 127/78 | HR 91 | Temp 97.3°F | Resp 20 | Ht 64.0 in | Wt 285.0 lb

## 2016-06-19 DIAGNOSIS — I83022 Varicose veins of left lower extremity with ulcer of calf: Secondary | ICD-10-CM

## 2016-06-19 DIAGNOSIS — L97229 Non-pressure chronic ulcer of left calf with unspecified severity: Principal | ICD-10-CM

## 2016-06-19 NOTE — Progress Notes (Signed)
Vascular and Vein Specialist of [facility]  Patient name: [name] MRN: [id number] DOB: August 10, 1940 Sex: female  REASON FOR CONSULT: Evaluation of venous hypertension and left leg venous ulceration.  HPI: [name] is a 76 y.o. female, who is here today for evaluation of her lower from the swelling and recently healed venous ulceration. She is very pleasant 76 year old female who reports injuring her left lateral calf with the car door in August 2017. She has had a very difficult time and has recently had total healing of this ulcer after a prolonged treatment at the wound center with topical agents and compression. She does report bilateral lower extremity swelling prior to this event. It is equal right and left leg. She does have diastolic dysfunction related pulmonary fibrosis and some congestive failure. No history of DVT. She does have morbid obesity. She had no prior venous ulcers prior to the trauma to her left lateral calf.  Past Medical History:  Diagnosis Date  . Arthritis    endstage changes bilateral knees/bilateral ankles.   . Asthma   . Carotid artery occlusion   . Chronic kidney disease   . Clotting disorder ([place])    pt denies this  . Contusion of left knee    due to fall 1/14.  [name] COPD (chronic obstructive pulmonary disease) (HCC)    pulmonary fibrosis  . Depression, reactive   . Diabetes mellitus   . Diastolic dysfunction   . Family history of heart disease   . High cholesterol   . Hypertension   . Interstitial lung disease ([place])   . 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    . Morbid obesity ([place])   . Normal coronary arteries    cardiac catheterization performed  10/31/14  . RA (rheumatoid arthritis) ([place])    has been off methotreaxte since 10/13.  [name] Shortness of breath   . Thyroid disease   . URI (upper respiratory  infection)     Family History  Problem Relation Age of Onset  . Diabetes Mother   . Heart attack Mother   . Hypertension Father   . Lung cancer Father   . Diabetes Sister   . Diabetes Brother   . Hypertension Brother   . Heart disease Brother   . Heart attack Brother   . Kidney cancer Brother   . Uterine cancer Daughter   . Breast cancer Sister   . Rheum arthritis Maternal Uncle   . Gout Brother   . Kidney failure Brother     x 5    SOCIAL HISTORY: Social History   Social History  . Marital status: Married    Spouse name: N/A  . Number of children: 6  . Years of education: college   Occupational History  . retired Marine scientist    Social History Main Topics  . Smoking status: Never Smoker  . Smokeless tobacco: Never Used  . Alcohol use No  . Drug use: No  . Sexual activity: Not Currently   Other Topics Concern  . Not on file   Social History Narrative   Patient consumes 2-3 cups coffee per day.    Allergies  Allergen Reactions  . Codeine Swelling    Facial swelling  . Remicade [Infliximab] Anaphylaxis    "sent me into shock"  . Zestril [Lisinopril] Swelling    Face and neck swelling    Current Outpatient Prescriptions  Medication Sig Dispense Refill  . ACCU-CHEK SMARTVIEW test strip 1 each by Other route every morning.   1  . acetaminophen (TYLENOL) 500 MG tablet Take 1,000 mg by mouth every 6 (six) hours as needed for moderate pain.     [name] albuterol (PROVENTIL HFA;VENTOLIN HFA) 108 (90 BASE) MCG/ACT inhaler Inhale 2 puffs into the lungs every 6 (six) hours as needed for wheezing or shortness of breath.    . allopurinol (ZYLOPRIM) 300 MG tablet Take by mouth.    [name] aspirin EC 81 MG tablet Take 81 mg by mouth every morning.     [name] atorvastatin (LIPITOR) 20 MG tablet Take 10 mg by mouth every evening.     [name] azithromycin (ZITHROMAX) 500 MG tablet Take 1 tablet by mouth daily. TAKE ON MON, WED & FRI  0  . Calcium Carb-Cholecalciferol (CALCIUM + D3 PO) Take 2  tablets by mouth daily with lunch.     . cholecalciferol (VITAMIN D) 1000 UNITS tablet Take 2,000 Units by mouth daily with lunch.     . diclofenac sodium (VOLTAREN) 1 % GEL Apply 4 g topically 4 (four) times daily. Voltaren Gel 3 grams to 3 large joints upto TID 3 TUBES with 3 refills 3 Tube 3  . DULoxetine (CYMBALTA) 60 MG capsule Take 1 capsule (60 mg total) by mouth daily. 30 capsule 4  . fluticasone (FLONASE) 50 MCG/ACT nasal spray Place 2 sprays into both nostrils daily. 16 g 3  . furosemide (LASIX) 20 MG tablet   1  . furosemide (LASIX) 40 MG tablet Take 1 tablet (40 mg total) by mouth 2 (two) times daily. (Patient taking differently: Take 20 mg by mouth daily. ) 60 tablet 11  . gabapentin (NEURONTIN) 600 MG tablet Take two in the morning , Two mid- afternoon and two at bedtime 540 tablet 0  . guaiFENesin (MUCINEX) 600 MG 12 hr tablet Take 1,200 mg by mouth 2 (two) times daily.    . hydroxychloroquine (PLAQUENIL) 200 MG tablet Take 200 mg by mouth daily.    [name] levothyroxine (SYNTHROID, LEVOTHROID) 50 MCG tablet Take 1 tablet (50 mcg total) by mouth daily. 30 tablet 1  . metoprolol succinate (TOPROL-XL) 100 MG 24 hr tablet Take 100 mg by mouth every morning. Take with or immediately following a meal.    . morphine (MS CONTIN) 15 MG 12 hr tablet Take 1 tablet (15 mg total) by mouth every 12 (twelve) hours. 60 tablet 0  . morphine (MSIR) 15 MG tablet Take 1 tablet (15 mg total) by mouth every 8 (eight) hours as needed for severe pain. 90 tablet 0  . Multiple Vitamin (MULTIVITAMIN WITH MINERALS) TABS Take 1 tablet by mouth daily with lunch.     . olmesartan-hydrochlorothiazide (BENICAR HCT) 40-25 MG tablet Take 1 tablet by mouth daily.     . OXYGEN-HELIUM IN Inhale 2 L into the lungs at bedtime. And on exertion    . pantoprazole (PROTONIX) 40 MG tablet take 1 tablet by mouth once daily MINUTES BEFORE 1ST MEAL OF THE DAY 30 tablet 5  . predniSONE (DELTASONE) 10 MG tablet Take 7.5 mg by mouth every  morning. Alternating 68m and 7.562m 0  . sodium chloride (OCEAN) 0.65 % SOLN nasal spray Place 1 spray into both nostrils as needed for congestion.    . Marland Kitchenpacer/Aero-Holding Chambers (AEROCHAMBER MV) inhaler Use as instructed 1 each 0  . VICTOZA 18 MG/3ML SOPN Inject 1.8 mg as directed daily.   0  .  collagenase (SANTYL) ointment Apply 1 application topically daily.    . cyclobenzaprine (FLEXERIL) 5 MG tablet Take 1 tablet (5 mg total) by mouth at bedtime as needed for muscle spasms. (Patient not taking: Reported on 06/19/2016) 30 tablet 1  . doxycycline (VIBRA-TABS) 100 MG tablet Take 1 tablet, twice daily for 5 days. Avoid sunlight and take after meals. (Patient not taking: Reported on 06/19/2016) 10 tablet 0  . famotidine (PEPCID) 20 MG tablet Take 1 tablet (20 mg total) by mouth at bedtime. (Patient not taking: Reported on 06/19/2016) 30 tablet 11  . predniSONE (DELTASONE) 10 MG tablet 40 mg x1 day, then 30 mg x1 day, then 20 mg x1 day, then 10 mg x1 day, and then 5 mg x1 day and stop (Patient not taking: Reported on 06/19/2016) 11 tablet 0   No current facility-administered medications for this visit.     REVIEW OF SYSTEMS:  _0  denotes positive finding, _1  denotes negative finding Cardiac  Comments:  Chest pain or chest pressure:    Shortness of breath upon exertion: x   Short of breath when lying flat:    Irregular heart rhythm:        Vascular    Pain in calf, thigh, or hip brought on by ambulation: x   Pain in feet at night that wakes you up from your sleep:  x   Blood clot in your veins:    Leg swelling:  x       Pulmonary    Oxygen at home: x   Productive cough:  x   Wheezing:  x       Neurologic    Sudden weakness in arms or legs:     Sudden numbness in arms or legs:     Sudden onset of difficulty speaking or slurred speech:    Temporary loss of vision in one eye:     Problems with dizziness:         Gastrointestinal    Blood in stool:     Vomited blood:           Genitourinary    Burning when urinating:     Blood in urine:        Psychiatric    Major depression:         Hematologic    Bleeding problems:    Problems with blood clotting too easily:        Skin    Rashes or ulcers: x       Constitutional    Fever or chills:      PHYSICAL EXAM: Vitals:   06/19/16 1000  BP: 127/78  Pulse: 91  Resp: 20  Temp: 97.3 F (36.3 C)  SpO2: 92%  Weight: 285 lb (129.3 kg)  Height: _2  (1.626 m)    GENERAL: The patient is a well-nourished female, in no acute distress. The vital signs are documented above. CARDIOVASCULAR: Carotid arteries are without bruits bilaterally. She has 2+ radial pulses bilaterally. She has 2+ dorsalis pedis pulses bilaterally PULMONARY: There is good air exchange  ABDOMEN: Soft and non-tender  MUSCULOSKELETAL: There are no major deformities or cyanosis. NEUROLOGIC: No focal weakness or paresthesias are detected. SKIN: She has an area of healed ulceration proximal and 4 x 5 cm over the lateral aspect of her left calf. No other evidence of hemosiderin deposit or changes of venous hypertension PSYCHIATRIC: The patient has a normal affect.  DATA:  She underwent noninvasive vascular laboratory studies of her  left leg on 05/23/2016. I have reviewed these and imaged her veins with SonoSite ultrasound myself. This shows no evidence of DVT. She does have reflux in her common femoral vein and popliteal vein. Her saphenous vein is small throughout her thigh. Just above her knee there are several branches that come together and there is a segment from just above her knee to mid calf where the vein is approximate 5 mm in diameter with some reflux present  MEDICAL ISSUES: I discussed the significance of this with patient. I feel that her ulcer was related to the trauma to her leg. She does not appear to have severe changes of venous hypertension. I did explain that her venous hypertension is related to deep system. Feel that she is  minimal if any benefit from ablation of the small segment of her great saphenous vein from mid calf to just above her knee. I have recommended knee-high compression and elevation when possible. She was reassured with this discussion will see [place] again on an as-needed basis   Rosetta Posner, MD [facility] Vascular and Vein Specialists of [facility] Tel [phone] Pager [phone]

## 2016-06-23 ENCOUNTER — Ambulatory Visit (INDEPENDENT_AMBULATORY_CARE_PROVIDER_SITE_OTHER): Payer: Medicare Other | Admitting: Internal Medicine

## 2016-06-23 ENCOUNTER — Encounter: Payer: Self-pay | Admitting: Internal Medicine

## 2016-06-23 VITALS — BP 122/80 | HR 86 | Ht 64.0 in | Wt 285.0 lb

## 2016-06-23 DIAGNOSIS — J479 Bronchiectasis, uncomplicated: Secondary | ICD-10-CM | POA: Diagnosis not present

## 2016-06-23 DIAGNOSIS — Z79899 Other long term (current) drug therapy: Secondary | ICD-10-CM | POA: Diagnosis not present

## 2016-06-23 DIAGNOSIS — J849 Interstitial pulmonary disease, unspecified: Secondary | ICD-10-CM | POA: Diagnosis not present

## 2016-06-23 NOTE — Progress Notes (Signed)
Subjective:     Patient ID: [name], female   DOB: 1940-10-14, 76 y.o.   MRN: [id number]  HPI   OV 01/03/2015  Chief Complaint  Patient presents with  . Follow-up    Pt c/o DOE, resolving cough that started over the weekend, chest discomfort when SOB and when lying down. Pt c/o increasing fatigue.    She has severe class 3 dyspnea with hypoxemia on account of BMI 50, diaast dsyfn, chronic pain, deconditioning and ILD due to RA on celcept/pred/bactrim   I personally last saw her in march 2-016. Since then has seen other provicers in this office and most recently in June 2016. In June 2016 admitted for acute CHF - cath showed mild pulm htn only with PA mean 24 and minimal CAD (reviewed chart of procedures June 2016). Dx as acute diast chf. Since then better. SHe had fu HRCT 11/29/14 that shows progression in ILD since march 2016 and now with UIP pattern (personally visualized image). She is finally accepting of fact that ILD is not makin cause of dyspnea and obesity is main problem. She is wondering about dc cellcept/pred/bactrim so as to minimize polypharmacy and side effect profile    OV 02/09/2015  Chief Complaint  Patient presents with  . Follow-up    Pt states her breathing is doing well the past few days. Pt recently went to [facility] ED for CP. Pt c/o prod cough with tan mucus. Pt denies CP/tightness    She has severe class 3 dyspnea with hypoxemia on account of BMI 50, diaast dsyfn, chronic pain, deconditioning and ILD due to RA. She is having progressive ILD. But given her miserable quality of life and polypharmacy and other side effects we decided to stop the CellCept and Bactrim. We also advise prednisone taper. This was all in the end of August 2016. But on 01/27/2015 she ended up in the emergency room with some respiratory difficulty. Her prednisone was bumped up. She subsequently saw her rheumatologist [name] was now tapering her prednisone again. There is also some concerns of  headaches and possibly temporal arteritis based on her description and apparently some kind of a biopsy scheduled. She feels that since stopping her CellCept and Bactrim her cough is returned. Nevertheless weight and knee pain and back pain continued to be major issues for her along with polypharmacy. Her Polysorb was includes MS Contin  [name]    OV 02/21/2015  - a ACUTE VISIT  Chief Complaint  Patient presents with  . Acute Visit    Pt c/o of chest tightness, productive cough with white/yellow mucus, and wheezing. Pt also c/o of feeling abdominal swelling/distention. Pt also c/o of constant fatigue. Pt states symptoms have been present x1 week.     Acute visit for this morbidly obese, rheumatoid arthritis patient with ILD who is now off CellCept  I saw her as recently as 02/09/2015. She reports that for the past 1 week she's having increased chest congestion, chest tightness, wheezing, shortness of breath, cough and yellow  sputum more than baseline. Symptoms are rated as moderate in severity. Relieved by inhaler. She has tried Mucinex for 1 week with some initial partial relief but symptoms progressed. So she made an acute visit.  She continues to stay off CellCept. She is not on prednisone 10 mg per day. She tells me [name] has held her prednisone at the dose because of concerns of temporal arteritis but apparently now that  his diagnoses being eliminated in the differential diagnosis. She will see [name] in the next few weeks to decide on further prednisone taper.    OV 04/16/2015  Chief Complaint  Patient presents with  . Follow-up    Pt states she felt much better after completing the abx and pred. Pt states her SOB is at basline, prod cough with yellow mucus at times, pt c/o chest tightness.     Morbidly obese female with rheumatoid arthritis with interstitial lung disease UIP pattern with associated deconditioning and diastolic dysfunction  She  continues to remain off CellCept. Last seen in October 2016. At that time I discharged her from the office with doxycycline and prednisone taper. After that early November 2016 she was given another course of Doxy and prednisone burst. This did not help. Sputum culture at this time showed normal flora. She called again with the middle of November was given Levaquin and prednisone burst. She feels Levaquin helped her immensely. At this point in time she is reporting progressive dyspnea and also worsening arthritis. She wants to go back on immunomodulators again. She has discussed Imuran with [name]. She is due to see [name] tomorrow. She also feels that she is unable to come off prednisone. She feels at a minimum she needs 10 mg of prednisone per day. She will discuss this with Dr. D her rheumatologist tomorrow. She's also interested in a second opinion at [facility] interstitial lung disease clinic which I did discuss with her most recently. She continues to use oxygen.     OV 07/17/2015  Chief Complaint  Patient presents with  . Follow-up    Pt states that she has improved. Pt c/o continued SOB with exertion, some ocugh and wheeze, and some intermittent chest tightness. Pt states that she does sleep a lot. Pt states that her heart rate remains high and that cardiology said it was due to her lungs. Pt is currently on Lasix, 37m, but still seems to retain a lot of fluid and is not seeming to go to the bathroom as frequently.    Follow-up interstitial lung disease secondary to rheumatoid arthritis in the setting of morbid obesity, poor quality of life and heavy chronic pain opioid use and depressive symptoms  Last seen November 2016. Since then she has seen [name] rheumatology clinic. She was given instructions on Imuran which she has red but she has not decided whether she should take it or not. She subsequently has followed up with [facility] interstitial lung disease clinic. CT scan of the chest reviewed result  shows possible UIP pattern 06/12/2015. She had autoimmune profile which was strongly positive for both rheumatoid factor and cyclic citrulline peptide both markers for rheumatoid arthritis. She did see Dr. CMargreta JourneyB on 07/02/2015. It appears that the note is not complete but according to the patient was supposed to be a multidisciplinary conference and they will get back to her. It is possible a second immunomodulators agent and is being contemplated but patient is leery of this given prior issues with opportunistic infection not otherwise specified and admissions to the hospital. In the interim she is also seen cardiology Janr 2017 Dr. BGwenlyn Foundwho did not feel dyspnea was cardiac related. I reviewed his note  OV 10/17/2015  Chief Complaint  Patient presents with  . Follow-up    Pt reports she feels improved. Pt c/o continued SOB with exertion, some wheeze and occasional cough. Pt denies CP/tightness.     Follow-up interstitial  lung disease secondary to rheumatoid arthritis in the setting of morbid obesity, poor quality of life and heavy chronic pain opioid use and depressive symptoms   Since seeing me last in February 2017. She followed up at [facility] pulmonary clinic in March 2017. I reviewed those notes. She saw Dr. Margreta Journey B on 07/31/2015. Due to the presence of bronchiectasis she's been started on  azithromycin 3 times a week. She says this has helped. In addition due to physical deconditioning and obesity she is getting home physical therapy is also help. She is interested in pulmonary rehabilitation although given obesity and back pain can be challenging. She nevertheless wants to try. She is on daily prednisone 10 mg a day. She met with [name] in the rheumatology clinic and has been referred now to [facility] rheumatology clinic for second opinion for a second immunomodulators. She continues to be morbidly obese and has been losing weight. She is somewhat interested in visiting with a  [facility].   OV 02/18/2016  Chief Complaint  Patient presents with  . Follow-up    Pt states she did not go to pulm rehab d/t BIL knee pain. Pt denies change in SOB, significant cough, CP/tightness and f/c/s. Pt states she is exercising at home. pt states she is doing well overall.    Problem List: 1. Extensive bronchiectasis - cylindrical vs. Traction - has been present and not significantly changed from 2014 to 2017 - has history of hospitalization for what sounds like pneumonia vs. Bronchiectasis flare as far back as 1999; underwent bronchoscopy which made her feel "like a new woman" in 1999 2. Pulmonary fibrosis - basilar predominant reticular opacities that have not change from 2014 to 2017 3. Rheumatoid arthritis: RF 959 ; CCP >300 - has been treated with methotrexate, biologics, etc in the past - none in the past several years - currently on 7.5/64m alter day 4. Morbid obesity with deconditioning - has had several hospitalizations in recent months; very deconditioned at present time  - [facility]   Followup for above  - [facility]. [facility] Review of the notes suggest that they have her on a slightly lower dose of prednisone with intent to taper to 5 mg per day. He also have her on hydroxychloroquine. No further disease modifying agents are being considered for rheumatoid arthritis. In terms of her interstitial lung disease and bronchiectasis she's on conservative therapy along with Activella device. This is helping and she feels better. Today she came in walking with her walker. This the happiest I have f seen her. She's not lost any weight but [facility] She tried to go to pulmonary rehabilitation  but could not because of knee pain. She is up-to-date with pneumonia vaccine. She's had a flu shot this season.    OV 03/18/2016  Chief Complaint  Patient presents with  . Acute Visit    Pt states breathing is not that bad today. Pt states she has cogestion build up, no tightness, SOB w/o excertion. Pt states breathing has been better since last visit except the mucus. No cough feels the need to cough but can not.      follow-up bronchiectasis and immunosppessive thrapy. Visit 3 month olow-up. Body mssindex is stll very high but she says she has lost weight. Overal well. Last few  days increasd symptoms of mucus, cough and subjective unwellness. No fever or colored sputum. But she feells she needs antibiotic and prednisone  OV 06/23/2016  Chief Complaint  Patient presents with  . Follow-up    Pt states her SOB has been doing well but she has been more fatigued. Pt has an occ prod cough with light yellow mucus and chest tightness when SOB - resolves with rest.    Follow-up bronchiectasis with interstitial lung disease on immunosuppressive therapy due to rheumatoid arthritis. Last visit 01/17/2016. Since then she's lost 5 pounds of weight according to history. According to chart she's lost between 213 pounds of weight. She is here with her daughter. No acute bronchitis symptoms. Overall stable. The main issues that she's having increased joint pain and she is only on plaquenil. No fever or colored sputum.    has a past medical history of Arthritis; Asthma; Carotid artery occlusion; Chronic kidney disease; Clotting disorder ([place]); Contusion of left knee; COPD (chronic obstructive pulmonary disease) ([place]); Depression, reactive; Diabetes mellitus; Diastolic dysfunction; Family history of heart disease; High cholesterol; Hypertension; Interstitial lung disease ([place]); 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 ([place]) (02/17/2012); Morbid obesity ([place]); Normal coronary arteries; RA  (rheumatoid arthritis) ([place]); Shortness of breath; Thyroid disease; and URI (upper respiratory infection).   reports that she has never smoked. She has never used smokeless tobacco.  Past Surgical History:  Procedure Laterality Date  . ABDOMINAL HYSTERECTOMY    . BRAIN SURGERY     Gamma knife 10/13. Needs repeat spring  '14  . CARDIAC CATHETERIZATION N/A 10/31/2014   Procedure: Right/Left Heart Cath and Coronary Angiography;  Surgeon: [name], MD;  Location: [place] [facility];  Service: Cardiovascular;  Laterality: N/A;  . ESOPHAGOGASTRODUODENOSCOPY (EGD) WITH PROPOFOL N/A 09/14/2014   Procedure: ESOPHAGOGASTRODUODENOSCOPY (EGD) WITH PROPOFOL;  Surgeon: [name], MD;  Location: [facility];  Service: Endoscopy;  Laterality: N/A;  . OVARY SURGERY    . SHOULDER SURGERY Left   . TONSILLECTOMY  age 26  . VIDEO BRONCHOSCOPY Bilateral 05/31/2013   Procedure: VIDEO BRONCHOSCOPY WITHOUT FLUORO;  Surgeon: Brand Males, MD;  Location: [place];  Service: Cardiopulmonary;  Laterality: Bilateral;  . video bronscoscopy  2000   lung    Allergies  Allergen Reactions  . Codeine Swelling    Facial swelling  . Remicade [Infliximab] Anaphylaxis    "sent me into shock"  . Zestril [Lisinopril] Swelling    Face and neck swelling    Immunization History  Administered Date(s) Administered  . Influenza Split 01/29/2012, 03/30/2013, 02/10/2014  . Influenza,inj,Quad PF,36+ Mos 02/09/2015, 12/18/2015  . Influenza-Unspecified 01/30/2015  . PPD Test 04/23/2015  . Pneumococcal Conjugate-13 08/21/2014  . Pneumococcal Polysaccharide-23 02/17/2012  . Tdap 12/27/2015    Family History  Problem Relation Age of Onset  . Diabetes Mother   . Heart attack Mother   . Hypertension Father   . Lung cancer Father   . Diabetes Sister   . Diabetes Brother   . Hypertension Brother   . Heart disease Brother   . Heart attack Brother   . Kidney cancer Brother   . Uterine cancer Daughter    . Breast cancer Sister   . Rheum arthritis Maternal Uncle   . Gout Brother   . Kidney failure Brother     x 5     Current Outpatient Prescriptions:  .  ACCU-CHEK SMARTVIEW test strip, 1 each by Other route every morning. , Disp: , Rfl: 1 .  acetaminophen (TYLENOL) 500 MG tablet, Take 1,000 mg by mouth every 6 (six) hours as needed for moderate pain. , Disp: , Rfl:  .  albuterol (PROVENTIL HFA;VENTOLIN HFA) 108 (90 BASE) MCG/ACT inhaler, Inhale 2 puffs into the lungs every 6 (six) hours as needed for wheezing or shortness of breath., Disp: , Rfl:  .  allopurinol (ZYLOPRIM) 300 MG tablet, Take by mouth., Disp: , Rfl:  .  aspirin EC 81 MG tablet, Take 81 mg by mouth every morning. , Disp: , Rfl:  .  atorvastatin (LIPITOR) 20 MG tablet, Take 10 mg by mouth every evening. , Disp: , Rfl:  .  azithromycin (ZITHROMAX) 500 MG tablet, Take 1 tablet by mouth daily. TAKE ON MON, WED & FRI, Disp: , Rfl: 0 .  Calcium Carb-Cholecalciferol (CALCIUM + D3 PO), Take 2 tablets by mouth daily with lunch. , Disp: , Rfl:  .  cholecalciferol (VITAMIN D) 1000 UNITS tablet, Take 2,000 Units by mouth daily with lunch. , Disp: , Rfl:  .  cyclobenzaprine (FLEXERIL) 5 MG tablet, Take 1 tablet (5 mg total) by mouth at bedtime as needed for muscle spasms., Disp: 30 tablet, Rfl: 1 .  diclofenac sodium (VOLTAREN) 1 % GEL, Apply 4 g topically 4 (four) times daily. Voltaren Gel 3 grams to 3 large joints upto TID 3 TUBES with 3 refills, Disp: 3 Tube, Rfl: 3 .  DULoxetine (CYMBALTA) 60 MG capsule, Take 1 capsule (60 mg total) by mouth daily., Disp: 30 capsule, Rfl: 4 .  fluticasone (FLONASE) 50 MCG/ACT nasal spray, Place 2 sprays into both nostrils daily., Disp: 16 g, Rfl: 3 .  furosemide (LASIX) 20 MG tablet, , Disp: , Rfl: 1 .  furosemide (LASIX) 40 MG tablet, Take 1 tablet (40 mg total) by mouth 2 (two) times daily. (Patient taking differently: Take 20 mg by mouth daily. ), Disp: 60 tablet, Rfl: 11 .  gabapentin (NEURONTIN)  600 MG tablet, Take two in the morning , Two mid- afternoon and two at bedtime, Disp: 540 tablet, Rfl: 0 .  guaiFENesin (MUCINEX) 600 MG 12 hr tablet, Take 1,200 mg by mouth 2 (two) times daily., Disp: , Rfl:  .  hydroxychloroquine (PLAQUENIL) 200 MG tablet, Take 200 mg by mouth daily., Disp: , Rfl:  .  levothyroxine (SYNTHROID, LEVOTHROID) 50 MCG tablet, Take 1 tablet (50 mcg total) by mouth daily., Disp: 30 tablet, Rfl: 1 .  metoprolol succinate (TOPROL-XL) 100 MG 24 hr tablet, Take 100 mg by mouth every morning. Take with or immediately following a meal., Disp: , Rfl:  .  morphine (MS CONTIN) 15 MG 12 hr tablet, Take 1 tablet (15 mg total) by mouth every 12 (twelve) hours., Disp: 60 tablet, Rfl: 0 .  morphine (MSIR) 15 MG tablet, Take 1 tablet (15 mg total) by mouth every 8 (eight) hours as needed for severe pain., Disp: 90 tablet, Rfl: 0 .  Multiple Vitamin (MULTIVITAMIN WITH MINERALS) TABS, Take 1 tablet by mouth daily with lunch. , Disp: , Rfl:  .  olmesartan-hydrochlorothiazide (BENICAR HCT) 40-25 MG tablet, Take 1 tablet by mouth daily. , Disp: , Rfl:  .  OXYGEN-HELIUM IN, Inhale 2 L into the lungs at bedtime. And on exertion, Disp: , Rfl:  .  pantoprazole (PROTONIX) 40 MG tablet, take 1 tablet by mouth once daily MINUTES BEFORE 1ST MEAL OF THE DAY, Disp: 30 tablet, Rfl: 5 .  predniSONE (DELTASONE) 10 MG tablet, Take 7.5 mg by mouth every morning. Alternating 73m and 7.527m Disp: ,  Rfl: 0 .  sodium chloride (OCEAN) 0.65 % SOLN nasal spray, Place 1 spray into both nostrils as needed for congestion., Disp: , Rfl:  .  Spacer/Aero-Holding Chambers (AEROCHAMBER MV) inhaler, Use as instructed, Disp: 1 each, Rfl: 0 .  VICTOZA 18 MG/3ML SOPN, Inject 1.8 mg as directed daily. , Disp: , Rfl: 0   Review of Systems     Objective:   Physical Exam  Constitutional: She is oriented to person, place, and time. She appears well-developed and well-nourished. No distress.  Obese and looks better  HENT:   Head: Normocephalic and atraumatic.  Right Ear: External ear normal.  Left Ear: External ear normal.  Mouth/Throat: Oropharynx is clear and moist. No oropharyngeal exudate.  Eyes: Conjunctivae and EOM are normal. Pupils are equal, round, and reactive to light. Right eye exhibits no discharge. Left eye exhibits no discharge. No scleral icterus.  Neck: Normal range of motion. Neck supple. No JVD present. No tracheal deviation present. No thyromegaly present.  Cardiovascular: Normal rate, regular rhythm, normal heart sounds and intact distal pulses.  Exam reveals no gallop and no friction rub.   No murmur heard. Pulmonary/Chest: Effort normal. No respiratory distress. She has wheezes. She has no rales. She exhibits no tenderness.  Abdominal: Soft. Bowel sounds are normal. She exhibits no distension and no mass. There is no tenderness. There is no rebound and no guarding.  Musculoskeletal: Normal range of motion. She exhibits no edema or tenderness.  Walks with a walker  Lymphadenopathy:    She has no cervical adenopathy.  Neurological: She is alert and oriented to person, place, and time. She has normal reflexes. No cranial nerve deficit. She exhibits normal muscle tone. Coordination normal.  Skin: Skin is warm and dry. No rash noted. She is not diaphoretic. No erythema. No pallor.  Psychiatric: She has a normal mood and affect. Her behavior is normal. Judgment and thought content normal.  Vitals reviewed. .  Vitals:   06/23/16 1609  BP: 122/80  Pulse: 86  SpO2: 94%  Weight: 285 lb (129.3 kg)  Height: _0  (1.626 m)    Estimated body mass index is 48.92 kg/m as calculated from the following:   Height as of this encounter: _1  (1.626 m).   Weight as of this encounter: 285 lb (129.3 kg).      Assessment:       ICD-9-CM ICD-10-CM   1. ILD (interstitial lung disease) ([place]) 515 J84.9   2. Bronchiectasis without complication (HCC) 704.8 J47.9   3. High risk medication use V58.69  Z79.899        Plan:      [facility] and continuing weight loss O[place] ILD stable  Immunosuppressants through rheumatology at [facility] (plaquenil, allopurino, prednisonel)  followup 8 months or sooner if needed with Dr Chase Caller    Dr. Brand Males, M.D., [facility].C.P Pulmonary and Critical Care Medicine Staff Physician [place] [facility] Pager: [phone], If no answer or between  15:00h - 7:00h: call [phone]  06/23/2016 4:35 PM

## 2016-06-23 NOTE — Patient Instructions (Addendum)
Glad [facility] are going [facility] and continuing weight loss O[place]all ILD stable  Immunosuppressants through rheumatology at [facility] (plaquenil, allopurino, prednisonel)  followup 8 months or sooner if needed with Dr Chase Caller

## 2016-06-24 DIAGNOSIS — I130 Hypertensive heart and chronic kidney disease with heart failure and stage 1 through stage 4 chronic kidney disease, or unspecified chronic kidney disease: Secondary | ICD-10-CM | POA: Diagnosis not present

## 2016-06-24 DIAGNOSIS — S81812D Laceration without foreign body, left lower leg, subsequent encounter: Secondary | ICD-10-CM | POA: Diagnosis not present

## 2016-06-24 DIAGNOSIS — T798XXD Other early complications of trauma, subsequent encounter: Secondary | ICD-10-CM | POA: Diagnosis not present

## 2016-06-24 DIAGNOSIS — E1122 Type 2 diabetes mellitus with diabetic chronic kidney disease: Secondary | ICD-10-CM | POA: Diagnosis not present

## 2016-06-24 DIAGNOSIS — N182 Chronic kidney disease, stage 2 (mild): Secondary | ICD-10-CM | POA: Diagnosis not present

## 2016-06-24 DIAGNOSIS — I5032 Chronic diastolic (congestive) heart failure: Secondary | ICD-10-CM | POA: Diagnosis not present

## 2016-06-25 DIAGNOSIS — M059 Rheumatoid arthritis with rheumatoid factor, unspecified: Secondary | ICD-10-CM | POA: Diagnosis not present

## 2016-06-25 DIAGNOSIS — Z1159 Encounter for screening for other viral diseases: Secondary | ICD-10-CM | POA: Diagnosis not present

## 2016-06-26 DIAGNOSIS — E1122 Type 2 diabetes mellitus with diabetic chronic kidney disease: Secondary | ICD-10-CM | POA: Diagnosis not present

## 2016-06-26 DIAGNOSIS — I129 Hypertensive chronic kidney disease with stage 1 through stage 4 chronic kidney disease, or unspecified chronic kidney disease: Secondary | ICD-10-CM | POA: Diagnosis not present

## 2016-06-26 DIAGNOSIS — E039 Hypothyroidism, unspecified: Secondary | ICD-10-CM | POA: Diagnosis not present

## 2016-06-26 DIAGNOSIS — I130 Hypertensive heart and chronic kidney disease with heart failure and stage 1 through stage 4 chronic kidney disease, or unspecified chronic kidney disease: Secondary | ICD-10-CM | POA: Diagnosis not present

## 2016-06-26 DIAGNOSIS — N183 Chronic kidney disease, stage 3 (moderate): Secondary | ICD-10-CM | POA: Diagnosis not present

## 2016-06-26 IMAGING — CT CT CHEST HIGH RESOLUTION W/O CM
1 of 5 series · 4 of 36 positions shown, 5 images · non-contrast
Comparison: Chest CT 10/05/2013.

CLINICAL DATA: 73-year-old female with worsening shortness of
breath with exertion for the past 6 weeks, with increased oxygen use
at night. Evaluate for interstitial lung disease.

EXAM:
CT CHEST WITHOUT CONTRAST
TECHNIQUE: Multidetector CT imaging of the chest was performed following the
standard protocol without intravenous contrast. High resolution
imaging of the lungs, as well as inspiratory and expiratory imaging,
was performed.

[Series 602: cor · coronal · 0.73mm/px · 4 of 116 slices shown, 5 images]
[im 24/116  mediastinal]
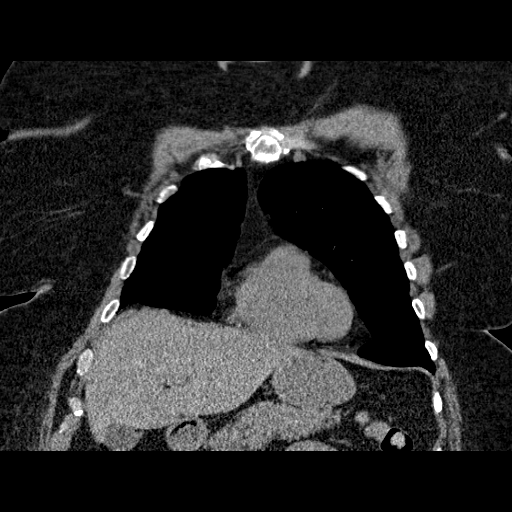
[im 24/116  lung]
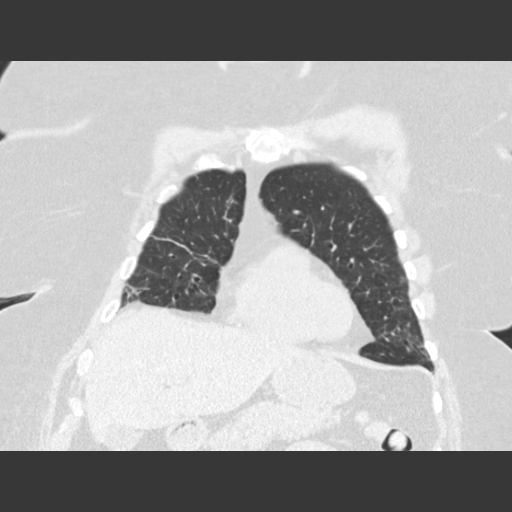
[im 47/116  lung]
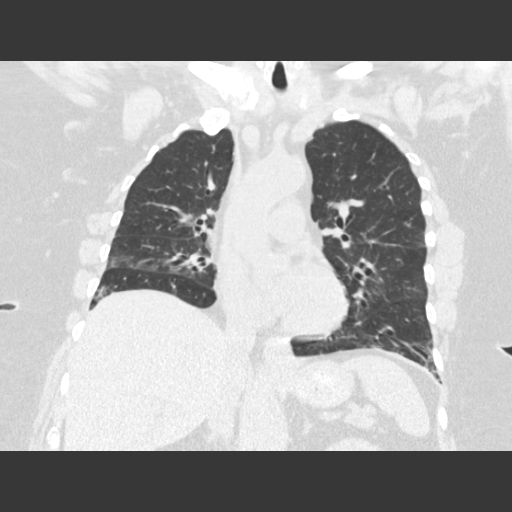
[im 70/116  lung]
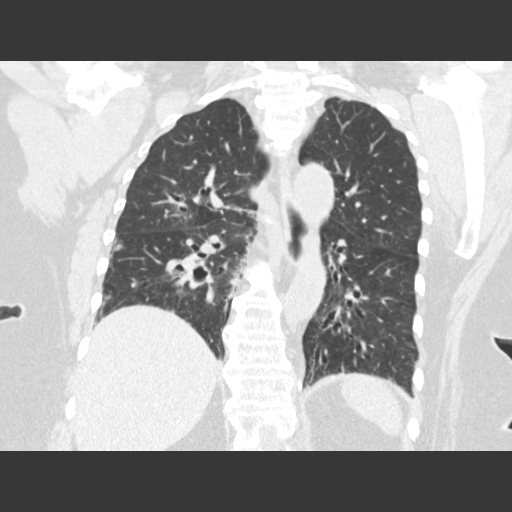
[im 93/116  lung]
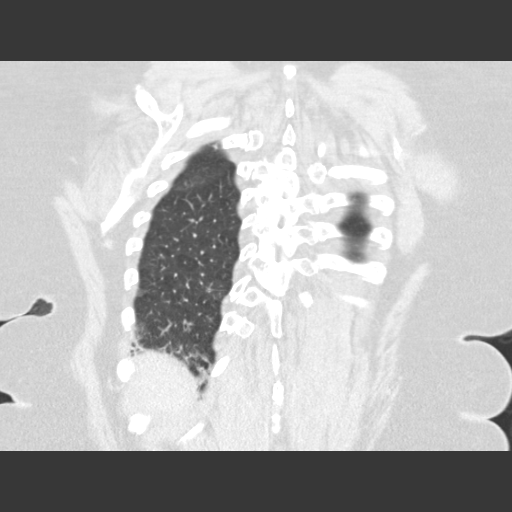

[4 of 36 positions shown; findings below may reference images not displayed]

FINDINGS: Mediastinum/Lymph Nodes: Heart size is normal. There is no
significant pericardial fluid, thickening or pericardial
calcification. There is atherosclerosis of the thoracic aorta, the
great vessels of the mediastinum and the coronary arteries,
including calcified atherosclerotic plaque in the left anterior
descending coronary artery. No pathologically enlarged mediastinal
or hilar lymph nodes. Please note that accurate exclusion of hilar
adenopathy is limited on noncontrast CT scans. Esophagus is
unremarkable in appearance. No axillary lymphadenopathy.

Lungs/Pleura: High-resolution images again demonstrate some mild
ground-glass attenuation in the periphery of the right lung base
involving the right middle and lower lobes, which appears unchanged
compared to the prior examination from 10/05/2013. In addition,
however, there is diffuse bronchial wall thickening and worsening
cylindrical and mild varicose bronchiectasis throughout the lungs
bilaterally this is associated with some increasing thickening of
the peribronchovascular interstitium (most severe in the mid to
lower lungs bilaterally). There is a small amount of subpleural
reticulation in the periphery of the lung bases bilaterally. There
may be some very early honeycombing in the right lower lobe (best
appreciated on image 23 of series 8), however, this is not a
predominant finding. Inspiratory and expiratory imaging demonstrates
extensive air trapping, indicative of small airways disease. No
acute consolidative airspace disease. No pleural effusions. No
definite suspicious appearing pulmonary nodules or masses.

Upper Abdomen: Small calcified granuloma in the central aspect of
the liver.

Musculoskeletal/Soft Tissues: There are no aggressive appearing
lytic or blastic lesions noted in the visualized portions of the
skeleton.
IMPRESSION: 1. The most striking finding on today's examination is worsening
cylindrical and mild varicose bronchiectasis throughout the lungs
bilaterally compared to prior study 10/05/2013.
2. As noted on the prior study, there continues to be some
asymmetrically distributed peripheral ground-glass attenuation in
the right lung base but there appears to be slightly greater
subpleural reticulation and thickening of the peribronchovascular
interstitium than seen on the prior examination, and there is one
area in the posterior aspect of the right lower lobe concerning for
early honeycombing. While these findings may simply reflect fibrotic
phase nonspecific interstitial pneumonia (NSIP), the progression of
disease, the strong craniocaudal gradient, and the possibility of
early honeycombing in the right lower lobe raises the possibility of
early usual interstitial pneumonia (UIP). Continued attention on
repeat high-resolution chest CT in 6-12 months is suggested to
assess for further temporal changes in the appearance of the lung
parenchyma if clinically appropriate.
3. Extensive air trapping, indicative of small airways disease.
4. Atherosclerosis, including left anterior descending coronary
artery disease. Assessment for potential risk factor modification,
dietary therapy or pharmacologic therapy may be warranted, if
clinically indicated.

## 2016-06-27 ENCOUNTER — Encounter: Payer: Medicare Other | Attending: Surgery | Admitting: Surgery

## 2016-06-27 DIAGNOSIS — E1122 Type 2 diabetes mellitus with diabetic chronic kidney disease: Secondary | ICD-10-CM | POA: Diagnosis not present

## 2016-06-27 DIAGNOSIS — L97222 Non-pressure chronic ulcer of left calf with fat layer exposed: Secondary | ICD-10-CM | POA: Insufficient documentation

## 2016-06-27 DIAGNOSIS — I129 Hypertensive chronic kidney disease with stage 1 through stage 4 chronic kidney disease, or unspecified chronic kidney disease: Secondary | ICD-10-CM | POA: Insufficient documentation

## 2016-06-27 DIAGNOSIS — E11622 Type 2 diabetes mellitus with other skin ulcer: Secondary | ICD-10-CM | POA: Diagnosis not present

## 2016-06-27 DIAGNOSIS — Z79899 Other long term (current) drug therapy: Secondary | ICD-10-CM | POA: Diagnosis not present

## 2016-06-27 DIAGNOSIS — N189 Chronic kidney disease, unspecified: Secondary | ICD-10-CM | POA: Diagnosis not present

## 2016-06-27 DIAGNOSIS — M069 Rheumatoid arthritis, unspecified: Secondary | ICD-10-CM | POA: Insufficient documentation

## 2016-06-27 DIAGNOSIS — I890 Lymphedema, not elsewhere classified: Secondary | ICD-10-CM | POA: Insufficient documentation

## 2016-06-27 DIAGNOSIS — E1140 Type 2 diabetes mellitus with diabetic neuropathy, unspecified: Secondary | ICD-10-CM | POA: Diagnosis not present

## 2016-06-27 DIAGNOSIS — J449 Chronic obstructive pulmonary disease, unspecified: Secondary | ICD-10-CM | POA: Diagnosis not present

## 2016-06-27 DIAGNOSIS — I83022 Varicose veins of left lower extremity with ulcer of calf: Secondary | ICD-10-CM | POA: Diagnosis not present

## 2016-06-27 DIAGNOSIS — L97229 Non-pressure chronic ulcer of left calf with unspecified severity: Secondary | ICD-10-CM | POA: Diagnosis not present

## 2016-06-27 DIAGNOSIS — I872 Venous insufficiency (chronic) (peripheral): Secondary | ICD-10-CM | POA: Diagnosis not present

## 2016-06-27 DIAGNOSIS — Z6841 Body Mass Index (BMI) 40.0 and over, adult: Secondary | ICD-10-CM | POA: Insufficient documentation

## 2016-06-28 NOTE — Progress Notes (Addendum)
[name], [name] ([id number]) Visit Report for 06/27/2016 Chief Complaint Document Details Patient Name: [name], [name]. Date of Service: 06/27/2016 2:30 PM Medical Record Number: [id number] Patient Account Number: [id number] Date of Birth/Sex: 1940/10/13 (76 y.o. Female) Treating RN: Montey Hora Primary Care Provider: Glendale Chard Other Clinician: Referring Provider: Glendale Chard Treating Provider/Extender: Frann Rider in Treatment: 21 Information Obtained from: Patient Chief Complaint [name] presents for evaluation of her left leg ulcer Electronic Signature(s) Signed: 06/27/2016 3:23:06 PM By: Christin Fudge MD, FACS Entered By: Christin Fudge on 06/27/2016 15:23:06 [name] ([id number]) -------------------------------------------------------------------------------- HPI Details Patient Name: [name]. Date of Service: 06/27/2016 2:30 PM Medical Record Number: [id number] Patient Account Number: [id number] Date of Birth/Sex: Oct 14, 1940 (76 y.o. Female) Treating RN: Montey Hora Primary Care Provider: Glendale Chard Other Clinician: Referring Provider: Glendale Chard Treating Provider/Extender: Frann Rider in Treatment: 21 History of Present Illness Location: left lateral lower extremity Quality: denies pain Severity: denies pain Duration: ulcer has been present for several months Timing: denies pain Context: traumatic injury HPI Description: 76 year old patient with a past medical history of diabetes mellitus, chronic kidney disease, COPD, hypertension, rheumatoid arthritis had a lacerated wound to her left calf when she injured it on the corner of a car door and it was a long 12 cm laceration. x-ray done at the time revealed that there was no fracture or radiopaque foreign body. the wound was sutured with 4-0 Prolene simple interrupted sutures and a total of 14 sutures were applied. on August 31 she was seen in the ER and it was  noted that the wound had dehisced and she was treated with Keflex. She has completed a course of doxycycline recently Medical history also significant for status post abdominal hysterectomy, brain surgery, cardiac catheterization, EGD and bronchoscopy. of late she was back in the ER and was seen last on August 31 and they tried Silvadene 02/22/16: pt reports discomfort and mild pain just below her wound. however, she admits on legs more than usual. no redness, foul odor or purulence. the wound is measuring slightly larger today, but likely secondary to poor edema control. nursing staff gave information to pt today regarding purchase of her compression stockings. she was encouraged to purchase soon. 02/29/16: returns today for f/u. she denies systemic s/s of infection. she brought her new compression stockings today. 03/21/2016 -- She has been put on an antibiotic and prednisone for a bronchitis by her pulmonologist. 04/04/2016 -- she continues to be on prednisone. 04-18-2016 [name] is for evaluation of her left lateral leg ulcer. She states that she is still on a titrating dose of prednisone. She also states that she recently received a steroid injection to her left knee. She states that her blood sugars have elevated slightly with the max reading in the 130s. She denies any pain or discomfort to her left leg ulcer. She states that she has been unable to wear her compression stockings and as a result has ankle increased edema to the left leg. 05/08/2016 - [name] returns today for evaluation of her left lateral leg ulcer. She states that she has not been able to wear her compression hose due to her arthritis and pain in her hands. She denies any issues with her wound. 05/16/2016 -- venous duplex studies which I had ordered have not been done yet and I have asked her to get in touch with the office. 05/23/2016 -- venous duplex studies was done today and we will await the  report. [name], [name] ([id number]) 05/30/2016 -- had a lower extremity venous duplex reflux evaluation which showed no evidence of DVT in the left femoral popliteal venous system. There was venous incompetence in the left saphenofemoral junction greater saphenous vein, common femoral vein and popliteal vein. Based on the above I would recommend a opinion from the vascular surgeons 06/27/2016 -- the patient was seen by Dr. Sherren Mocha early on 06/19/2016 and after review of her data and physical examination, he fell she did not have severe changes of venous hypertension and would get minimal if any benefit from ablation of a small segment of her great saphenous vein from mid calf to just above her knee. He had recommended knee-high compressions and elevation when possible. she returns today as she had noticed a small bruise in the area for wound and because of this she was concerned and came back for an opinion Electronic Signature(s) Signed: 06/27/2016 3:23:28 PM By: Christin Fudge MD, FACS Previous Signature: 06/27/2016 2:56:12 PM Version By: Christin Fudge MD, FACS Previous Signature: 06/27/2016 2:55:39 PM Version By: Christin Fudge MD, FACS Entered By: Christin Fudge on 06/27/2016 15:23:28 [name] ([id number]) -------------------------------------------------------------------------------- Physical Exam Details Patient Name: [name]. Date of Service: 06/27/2016 2:30 PM Medical Record Number: [id number] Patient Account Number: [id number] Date of Birth/Sex: 1940/07/10 (76 y.o. Female) Treating RN: Montey Hora Primary Care Provider: Glendale Chard Other Clinician: Referring Provider: Glendale Chard Treating Provider/Extender: Frann Rider in Treatment: 21 Constitutional . Pulse regular. Respirations normal and unlabored. Afebrile. . Eyes Nonicteric. Reactive to light. Ears, Nose, Mouth, and Throat Lips, teeth, and gums WNL.[name] Moist mucosa without  lesions. Neck supple and nontender. No palpable supraclavicular or cervical adenopathy. Normal sized without goiter. Respiratory WNL. No retractions.. Cardiovascular Pedal Pulses WNL. No clubbing, cyanosis or edema. Chest Breasts symmetical and no nipple discharge.. Breast tissue WNL, no masses, lumps, or tenderness.. Gastrointestinal (GI) Abdomen without masses or tenderness.. No liver or spleen enlargement or tenderness.. Genitourinary (GU) No hydrocele, spermatocele, tenderness of the cord, or testicular mass.[name] Penis without lesions.[name] without lesions. No cystocele, or rectocele. Pelvic support intact, no discharge.[name] Urethra without masses, tenderness or scarring.[name] Lymphatic No adneopathy. No adenopathy. No adenopathy. Musculoskeletal Adexa without tenderness or enlargement.. Digits and nails w/o clubbing, cyanosis, infection, petechiae, ischemia, or inflammatory conditions.. Integumentary (Hair, Skin) No suspicious lesions. No crepitus or fluctuance. No peri-wound warmth or erythema. No masses.[name] Psychiatric Judgement and insight Intact.. No evidence of depression, anxiety, or agitation.. Notes the wound remains completely healed and the little area of bruise does not have any ulceration but it is epithelialized. Electronic Signature(s) ALLINE, PIO ([id number]) Signed: 06/27/2016 3:25:04 PM By: Christin Fudge MD, FACS Entered By: Christin Fudge on 06/27/2016 15:25:04 [name] ([id number]) -------------------------------------------------------------------------------- Physician Orders Details Patient Name: [name]. Date of Service: 06/27/2016 2:30 PM Medical Record Number: [id number] Patient Account Number: [id number] Date of Birth/Sex: August 15, 1940 (76 y.o. Female) Treating RN: Cornell Barman Primary Care Provider: Glendale Chard Other Clinician: Referring Provider: Glendale Chard Treating Provider/Extender: Frann Rider in Treatment:  21 Verbal / Phone Orders: Yes Clinician: Cornell Barman Read Back and Verified: Yes Diagnosis Coding Discharge From [facility] Services o Discharge from [place] - Please keep area covered for another 2 weeks with gauze and telfa island dressing and wear compression daily from morning until bedtime. HHRN please continue to monitor skin integrity for 2-3 more weeks and notify [facility] at [phone] if any problems arise Electronic Signature(s) Signed: 06/27/2016  3:35:41 PM By: Montey Hora Signed: 06/27/2016 3:35:58 PM By: Christin Fudge MD, FACS Entered By: Montey Hora on 06/27/2016 15:35:41 [name] ([id number]) -------------------------------------------------------------------------------- Problem List Details Patient Name: KALIYA, SHREINER. Date of Service: 06/27/2016 2:30 PM Medical Record Number: [id number] Patient Account Number: [id number] Date of Birth/Sex: May 18, 1941 (76 y.o. 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 Date of Service: 06/27/2016 2:30 PM Medical Record Number: [id number] Patient Account Number:  [id number] Date of Birth/Sex: 1940/10/10 (76 y.o. Female) Treating RN: Montey Hora Primary Care Provider: Glendale Chard Other Clinician: Referring Provider: Glendale Chard Treating Provider/Extender: Frann Rider in Treatment: 21 Subjective Chief Complaint Information obtained from Patient Ms. Creamer presents for evaluation of her left leg ulcer History of Present Illness (HPI) The following HPI elements were documented for the patient's wound: Location: left lateral lower extremity Quality: denies pain Severity: denies pain Duration: ulcer has been present for several months Timing: denies pain Context: traumatic injury 76 year old patient with a past medical history of diabetes mellitus, chronic kidney disease, COPD, hypertension, rheumatoid arthritis had a lacerated wound to her left calf when she injured it on the corner of a car door and it was a long 12 cm laceration. x-ray done at the time revealed that there was no fracture or radiopaque foreign body. the wound was sutured with 4-0 Prolene simple interrupted sutures and a total of 14 sutures were applied. on August 31 she was seen in the ER and it was noted that the wound had dehisced and she was treated with Keflex. She has completed a course of doxycycline recently Medical history also significant for status post abdominal hysterectomy, brain surgery, cardiac catheterization, EGD and bronchoscopy. of late she was back in the ER and was seen last on August 31 and they tried Silvadene 02/22/16: pt reports discomfort and mild pain just below her wound. however, she admits on legs more than usual. no redness, foul odor or purulence. the wound is measuring slightly larger today, but likely secondary to poor edema control. nursing staff gave information to pt today regarding purchase of her compression stockings. she was encouraged to purchase soon. 02/29/16: returns today for f/u. she denies systemic s/s of infection.  she brought her new compression stockings today. 03/21/2016 -- She has been put on an antibiotic and prednisone for a bronchitis by her pulmonologist. 04/04/2016 -- she continues to be on prednisone. 04-18-2016 Ms. Marich is for evaluation of her left lateral leg ulcer. She states that she is still on a titrating dose of prednisone. She also states that she recently received a steroid injection to her left knee. She states that her blood sugars have elevated slightly with the max reading in the 130s. She denies any pain or discomfort to her left leg ulcer. She states that she has been unable to wear her compression stockings and as a result has ankle increased edema to  the left leg. [name], [name] ([id number]) 05/08/2016 - Ms. Lehnert returns today for evaluation of her left lateral leg ulcer. She states that she has not been able to wear her compression hose due to her arthritis and pain in her hands. She denies any issues with her wound. 05/16/2016 -- venous duplex studies which I had ordered have not been done yet and I have asked her to get in touch with the office. 05/23/2016 -- venous duplex studies was done today and we will await the report. 05/30/2016 -- had a lower extremity venous duplex reflux evaluation which showed no evidence of DVT in the left femoral popliteal venous system. There was venous incompetence in the left saphenofemoral junction greater saphenous vein, common femoral vein and popliteal vein. Based on the above I would recommend a opinion from the vascular surgeons 06/27/2016 -- the patient was seen by Dr. Sherren Mocha early on 06/19/2016 and after review of her data and physical examination, he fell she did not have severe changes of venous hypertension and would get minimal if any benefit from ablation of a small segment of her great saphenous vein from mid calf to just above her knee. He had recommended knee-high compressions and elevation when possible. she returns  today as she had noticed a small bruise in the area for wound and because of this she was concerned and came back for an opinion Objective Constitutional Pulse regular. Respirations normal and unlabored. Afebrile. Vitals Time Taken: 2:55 PM, Height: 64 in, Weight: 283 lbs, BMI: 48.6, Temperature: 97.7 F, Pulse: 82 bpm, Respiratory Rate: 18 breaths/min, Blood Pressure: 99/67 mmHg. Eyes Nonicteric. Reactive to light. Ears, Nose, Mouth, and Throat Lips, teeth, and gums WNL.[name] Moist mucosa without lesions. Neck supple and nontender. No palpable supraclavicular or cervical adenopathy. Normal sized without goiter. Respiratory WNL. No retractions.. Cardiovascular Pedal Pulses WNL. No clubbing, cyanosis or edema. [name], [name] ([id number]) Chest Breasts symmetical and no nipple discharge.. Breast tissue WNL, no masses, lumps, or tenderness.. Gastrointestinal (GI) Abdomen without masses or tenderness.. No liver or spleen enlargement or tenderness.. Genitourinary (GU) No hydrocele, spermatocele, tenderness of the cord, or testicular mass.[name] Penis without lesions.[name] without lesions. No cystocele, or rectocele. Pelvic support intact, no discharge.[name] Urethra without masses, tenderness or scarring.[name] Lymphatic No adneopathy. No adenopathy. No adenopathy. Musculoskeletal Adexa without tenderness or enlargement.. Digits and nails w/o clubbing, cyanosis, infection, petechiae, ischemia, or inflammatory conditions.[name] Psychiatric Judgement and insight Intact.. No evidence of depression, anxiety, or agitation.. General Notes: the wound remains completely healed and the little area of bruise does not have any ulceration but it is epithelialized. Integumentary (Hair, Skin) No suspicious lesions. No crepitus or fluctuance. No peri-wound warmth or erythema. No masses.. 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 HHRN please continue to monitor skin integrity for 2-3 more weeks and notify [facility] at [phone] if any problems arise The patient has a small bruise over the area which had previously healed and there is no ulceration and there is healthy epithelialization. I have reassured her to protect this with  the forearm and continued to use her compression stockings. I have also discussed her recent vascular report and surgical opinion regarding the fact that she does not need any ablative surgery for her minor varicose veins. She is discharged on the wound services and will be seen back only if needed. Electronic Signature(s) Signed: 06/27/2016 3:39:24 PM By: Christin Fudge MD, FACS Previous Signature: 06/27/2016 3:27:13 PM Version By: Christin Fudge MD, FACS Entered By: Christin Fudge on 06/27/2016 15:39:24 [name] ([id number]) -------------------------------------------------------------------------------- SuperBill Details Patient Name: [name]. Date of Service: 06/27/2016 Medical Record Number: [id number] Patient Account Number: [id number] Date of Birth/Sex: 1940/10/31 (76 y.o. 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 Facility Procedures CPT4 Code: [id number] Description: [id number] - WOUND CARE VISIT-LEV 2 EST PT Modifier: Quantity: 1 Physician Procedures CPT4 Code: [id number] Description: 11031 - WC PHYS LEVEL 2 - EST PT ICD-10 Description Diagnosis E11.622 Type 2 diabetes mellitus with other skin ulcer L97.222 Non-pressure chronic ulcer of left calf with fat I89.0 Lymphedema, not elsewhere classified I83.022 Varicose veins  of left lower extremity with ulcer Modifier: layer exposed of calf Quantity: 1 Electronic Signature(s) Signed: 06/27/2016 3:31:07 PM By: Montey Hora Signed: 06/27/2016 3:35:58 PM By: Christin Fudge MD, FACS Previous Signature: 06/27/2016 3:30:04 PM Version By: Christin Fudge MD, FACS Entered By: Montey Hora on 06/27/2016 15:31:06

## 2016-06-28 NOTE — Progress Notes (Signed)
Joan, Mcdaniel ([id number]) Visit Report for 06/27/2016 Arrival Information Details Patient Name: [name], [name]. Date of Service: 06/27/2016 2:30 PM Medical Record Number: [id number] Patient Account Number: [id number] Date of Birth/Sex: [birth year] (76 y.o. Female) Treating RN: Cornell Barman Primary Care Leda Bellefeuille: Glendale Chard Other Clinician: Referring Edgard Debord: Glendale Chard Treating Cristhian Vanhook/Extender: Frann Rider in Treatment: 21 Visit Information History Since Last Visit Added or deleted any medications: Yes Patient Arrived: Walker Any new allergies or adverse reactions: No Arrival Time: 14:54 Had a fall or experienced change in No Accompanied By: self activities of daily living that may affect Transfer Assistance: None risk of falls: Patient Identification Verified: Yes Signs or symptoms of abuse/neglect since last No Secondary Verification Process Completed: Yes visito Patient Requires Transmission-Based No Pain Present Now: No Precautions: Patient Has Alerts: Yes Patient Alerts: DM II Electronic Signature(s) Signed: 06/27/2016 4:09:08 PM By: Gretta Cool, RN, BSN, Kim RN, BSN Entered By: Gretta Cool, RN, BSN, Kim on 06/27/2016 14:54:24 [name] ([id number]) -------------------------------------------------------------------------------- Clinic Level of Care Assessment Details Patient Name: [name]. Date of Service: 06/27/2016 2:30 PM Medical Record Number: [id number] Patient Account Number: [id number] Date of Birth/Sex: 1941/03/30 (76 y.o. Female) Treating RN: Montey Hora Primary Care Wisdom Seybold: Glendale Chard Other Clinician: Referring Mahsa Hanser: Glendale Chard Treating Randa Riss/Extender: Frann Rider in Treatment: 21 Clinic Level of Care Assessment Items TOOL 4 Quantity Score _0  - Use when only an EandM is performed on FOLLOW-UP visit 0 ASSESSMENTS - Nursing Assessment / Reassessment X - Reassessment of Co-morbidities (includes updates  in patient status) 1 10 X - Reassessment of Adherence to Treatment Plan 1 5 ASSESSMENTS - Wound and Skin Assessment / Reassessment _1  - Simple Wound Assessment / Reassessment - one wound 0 _2  - Complex Wound Assessment / Reassessment - multiple wounds 0 _3  - Dermatologic / Skin Assessment (not related to wound area) 0 ASSESSMENTS - Focused Assessment X - Circumferential Edema Measurements - multi extremities 1 5 _4  - Nutritional Assessment / Counseling / Intervention 0 X - Lower Extremity Assessment (monofilament, tuning fork, pulses) 1 5 _5  - Peripheral Arterial Disease Assessment (using hand held doppler) 0 ASSESSMENTS - Ostomy and/or Continence Assessment and Care _6  - Incontinence Assessment and Management 0 _7  - Ostomy Care Assessment and Management (repouching, etc.) 0 PROCESS - Coordination of Care X - Simple Patient / Family Education for ongoing care 1 15 _8  - Complex (extensive) Patient / Family Education for ongoing care 0 _9  - Staff obtains Programmer, systems, Records, Test Results / Process Orders 0 _10  - Staff telephones HHA, Nursing Homes / Clarify orders / etc 0 _11  - Routine Transfer to another Facility (non-emergent condition) 0 Shugrue, Samoria F. ([id number]) _12  - Routine Hospital Admission (non-emergent condition) 0 _13  - New Admissions / Biomedical engineer / Ordering NPWT, Apligraf, etc. 0 _14  - Emergency Hospital Admission (emergent condition) 0 X - Simple Discharge Coordination 1 10 _15  - Complex (extensive) Discharge Coordination 0 PROCESS - Special Needs _16  - Pediatric / Minor Patient Management 0 _17  - Isolation Patient Management 0 _18  - Hearing / Language / Visual special needs 0 _19  - Assessment of Community assistance (transportation, D/C planning, etc.) 0 _20  - Additional assistance / Altered mentation 0 _21  - Support Surface(s) Assessment (bed, cushion, seat, etc.) 0 INTERVENTIONS - Wound Cleansing / Measurement _22  - Simple Wound Cleansing - one wound 0 _23  -  Complex Wound Cleansing - multiple wounds 0 _24  - Wound Imaging (photographs - any number of wounds) 0 _25  - Wound Tracing (instead of  photographs) 0 _0  - Simple Wound Measurement - one wound 0 _1  - Complex Wound Measurement - multiple wounds 0 INTERVENTIONS - Wound Dressings _2  - Small Wound Dressing one or multiple wounds 0 _3  - Medium Wound Dressing one or multiple wounds 0 _4  - Large Wound Dressing one or multiple wounds 0 <MHWKGSUPJSRPRXYV>_8<\/PFYTWKMQKMMNOTRR>_1  - Application of Medications - topical 0 <HAFBXUXYBFXOVANV>_9<\/TYOMAYOKHTXHFSFS>_2  - Application of Medications - injection 0 INTERVENTIONS - Miscellaneous _7  - External ear exam 0 Joan, Mcdaniel. ([id number]) _8  - Specimen Collection (cultures, biopsies, blood, body fluids, etc.) 0 _9  - Specimen(s) / Culture(s) sent or taken to Lab for analysis 0 _10  - Patient Transfer (multiple staff / Harrel Lemon Lift / Similar devices) 0 _11  - Simple Staple / Suture removal (25 or less) 0 _12  - Complex Staple / Suture removal (26 or more) 0 _13  - Hypo / Hyperglycemic Management (close monitor of Blood Glucose) 0 _14  - Ankle / Brachial Index (ABI) - do not check if billed separately 0 X - Vital Signs 1 5 Has the patient been seen at the hospital within the last three years: Yes Total Score: 55 Level Of Care: New/Established - Level 2 Electronic Signature(s) Signed: 06/27/2016 4:09:04 PM By: Montey Hora Entered By: Montey Hora on 06/27/2016 15:30:56 [name] ([id number]) -------------------------------------------------------------------------------- Encounter Discharge Information Details Patient Name: [name]. Date of Service: 06/27/2016 2:30 PM Medical Record Number: [id number] Patient Account Number: [id number] Date of Birth/Sex: 06/23/1940 (76 y.o. Female) Treating RN: Montey Hora Primary Care Jonael Paradiso: Glendale Chard Other Clinician: Referring Mkayla Steele: Glendale Chard Treating Erinne Gillentine/Extender: Frann Rider in Treatment: 21 Encounter Discharge Information Items Discharge  Pain Level: 0 Discharge Condition: Stable Ambulatory Status: Walker Discharge Destination: Home Transportation: Private Auto Accompanied By: self Schedule Follow-up Appointment: No Medication Reconciliation completed No and provided to Patient/Care Fielding Mault: Provided on Clinical Summary of Care: 06/27/2016 Form Type Recipient Paper Patient DG Electronic Signature(s) Signed: 06/27/2016 3:31:31 PM By: Montey Hora Previous Signature: 06/27/2016 3:23:47 PM Version By: Ruthine Dose Entered By: Montey Hora on 06/27/2016 15:31:31 [name] ([id number]) -------------------------------------------------------------------------------- Lower Extremity Assessment Details Patient Name: [name]. Date of Service: 06/27/2016 2:30 PM Medical Record Number: [id number] Patient Account Number: [id number] Date of Birth/Sex: 07/17/1940 (76 y.o. Female) Treating RN: Cornell Barman Primary Care Dhrithi Riche: Glendale Chard Other Clinician: Referring Markeise Mathews: Glendale Chard Treating Evon Dejarnett/Extender: Frann Rider in Treatment: 21 Edema Assessment Assessed: [Left: No] [Right: No] Edema: [Left: Ye] [Right: s] Calf Left: Right: Point of Measurement: 34 cm From Medial Instep 46.9 cm cm Ankle Left: Right: Point of Measurement: 11 cm From Medial Instep 31.8 cm cm Vascular Assessment Pulses: Dorsalis Pedis Palpable: [Left:Yes] Posterior Tibial Extremity colors, hair growth, and conditions: Extremity Color: [Left:Hyperpigmented] Hair Growth on Extremity: [Left:No] Temperature of Extremity: [Left:Warm] Capillary Refill: [Left:< 3 seconds] Electronic Signature(s) Signed: 06/27/2016 4:09:08 PM By: Gretta Cool, RN, BSN, Kim RN, BSN Entered By: Gretta Cool, RN, BSN, Kim on 06/27/2016 15:16:47 [name] ([id number]) -------------------------------------------------------------------------------- Multi Wound Chart Details Patient Name: [name]. Date of Service: 06/27/2016 2:30  PM Medical Record Number: [id number] Patient Account Number: [id number] Date of Birth/Sex: [birth year] (76 y.o. Female) Treating RN: Montey Hora Primary Care Quanell Loughney: Glendale Chard Other Clinician: Referring Makeyla Govan: Glendale Chard Treating Malosi Hemstreet/Extender: Frann Rider in Treatment: 21 Vital Signs Height(in): 64 Pulse(bpm): 82 Weight(lbs): 283 Blood Pressure 99/67 (mmHg): Body Mass Index(BMI): 49 Temperature(F): 97.7 Respiratory Rate 18 (breaths/min): Wound Assessments Treatment Notes Electronic Signature(s) Signed: 06/27/2016 3:23:01 PM By: Christin Fudge MD, FACS Entered By: Christin Fudge  on 06/27/2016 15:23:01 JENIECE, HANNIS ([id number]) -------------------------------------------------------------------------------- Pain Assessment Details Patient Name: ARLETT, GOOLD. Date of Service: 06/27/2016 2:30 PM Medical Record Number: [id number] Patient Account Number: [id number] Date of Birth/Sex: 1941/02/17 (76 y.o. Female) Treating RN: Cornell Barman Primary Care Kristoff Coonradt: Glendale Chard Other Clinician: Referring Jaxie Racanelli: Glendale Chard Treating Dariah Mcsorley/Extender: Frann Rider in Treatment: 21 Active Problems Location of Pain Severity and Description of Pain Patient Has Paino No Site Locations With Dressing Change: No Pain Management and Medication Current Pain Management: Electronic Signature(s) Signed: 06/27/2016 4:09:08 PM By: Gretta Cool, RN, BSN, Kim RN, BSN Entered By: Gretta Cool, RN, BSN, Kim on 06/27/2016 14:55:04 [name] ([id number]) -------------------------------------------------------------------------------- Patient/Caregiver Education Details Patient Name: [name]. Date of Service: 06/27/2016 2:30 PM Medical Record Number: [id number] Patient Account Number: [id number] Date of Birth/Gender: 13-Apr-1941 (76 y.o. Female) Treating RN: Montey Hora Primary Care Physician: Glendale Chard Other Clinician: Referring Physician:  Glendale Chard Treating Physician/Extender: Frann Rider in Treatment: 21 Education Assessment Education Provided To: Patient Education Topics Provided Basic Hygiene: Handouts: Other: care of newly healed ulcer site Methods: Explain/Verbal Responses: State content correctly Electronic Signature(s) Signed: 06/27/2016 4:09:04 PM By: Montey Hora Entered By: Montey Hora on 06/27/2016 15:31:53 [name] ([id number]) -------------------------------------------------------------------------------- Vitals Details Patient Name: [name]. Date of Service: 06/27/2016 2:30 PM Medical Record Number: [id number] Patient Account Number: [id number] Date of Birth/Sex: [birth year] (76 y.o. Female) Treating RN: Cornell Barman Primary Care Terrin Imparato: Glendale Chard Other Clinician: Referring Morty Ortwein: Glendale Chard Treating Quetzalli Clos/Extender: Frann Rider in Treatment: 21 Vital Signs Time Taken: 14:55 Temperature (F): 97.7 Height (in): 64 Pulse (bpm): 82 Weight (lbs): 283 Respiratory Rate (breaths/min): 18 Body Mass Index (BMI): 48.6 Blood Pressure (mmHg): 99/67 Reference Range: 80 - 120 mg / dl Electronic Signature(s) Signed: 06/27/2016 4:09:08 PM By: Gretta Cool, RN, BSN, Kim RN, BSN Entered By: Gretta Cool, RN, BSN, Kim on 06/27/2016 14:56:40

## 2016-07-01 DIAGNOSIS — E1122 Type 2 diabetes mellitus with diabetic chronic kidney disease: Secondary | ICD-10-CM | POA: Diagnosis not present

## 2016-07-01 DIAGNOSIS — T798XXD Other early complications of trauma, subsequent encounter: Secondary | ICD-10-CM | POA: Diagnosis not present

## 2016-07-01 DIAGNOSIS — I5032 Chronic diastolic (congestive) heart failure: Secondary | ICD-10-CM | POA: Diagnosis not present

## 2016-07-01 DIAGNOSIS — S81812D Laceration without foreign body, left lower leg, subsequent encounter: Secondary | ICD-10-CM | POA: Diagnosis not present

## 2016-07-01 DIAGNOSIS — I130 Hypertensive heart and chronic kidney disease with heart failure and stage 1 through stage 4 chronic kidney disease, or unspecified chronic kidney disease: Secondary | ICD-10-CM | POA: Diagnosis not present

## 2016-07-01 DIAGNOSIS — N182 Chronic kidney disease, stage 2 (mild): Secondary | ICD-10-CM | POA: Diagnosis not present

## 2016-07-08 DIAGNOSIS — I130 Hypertensive heart and chronic kidney disease with heart failure and stage 1 through stage 4 chronic kidney disease, or unspecified chronic kidney disease: Secondary | ICD-10-CM | POA: Diagnosis not present

## 2016-07-08 DIAGNOSIS — T798XXD Other early complications of trauma, subsequent encounter: Secondary | ICD-10-CM | POA: Diagnosis not present

## 2016-07-08 DIAGNOSIS — N182 Chronic kidney disease, stage 2 (mild): Secondary | ICD-10-CM | POA: Diagnosis not present

## 2016-07-08 DIAGNOSIS — S81812D Laceration without foreign body, left lower leg, subsequent encounter: Secondary | ICD-10-CM | POA: Diagnosis not present

## 2016-07-08 DIAGNOSIS — E1122 Type 2 diabetes mellitus with diabetic chronic kidney disease: Secondary | ICD-10-CM | POA: Diagnosis not present

## 2016-07-08 DIAGNOSIS — I5032 Chronic diastolic (congestive) heart failure: Secondary | ICD-10-CM | POA: Diagnosis not present

## 2016-07-11 ENCOUNTER — Encounter: Payer: Medicare Other | Admitting: Surgery

## 2016-07-11 DIAGNOSIS — E11622 Type 2 diabetes mellitus with other skin ulcer: Secondary | ICD-10-CM | POA: Diagnosis not present

## 2016-07-11 DIAGNOSIS — I83022 Varicose veins of left lower extremity with ulcer of calf: Secondary | ICD-10-CM | POA: Diagnosis not present

## 2016-07-11 DIAGNOSIS — N189 Chronic kidney disease, unspecified: Secondary | ICD-10-CM | POA: Diagnosis not present

## 2016-07-11 DIAGNOSIS — L97222 Non-pressure chronic ulcer of left calf with fat layer exposed: Secondary | ICD-10-CM | POA: Diagnosis not present

## 2016-07-11 DIAGNOSIS — I890 Lymphedema, not elsewhere classified: Secondary | ICD-10-CM | POA: Diagnosis not present

## 2016-07-11 DIAGNOSIS — E1122 Type 2 diabetes mellitus with diabetic chronic kidney disease: Secondary | ICD-10-CM | POA: Diagnosis not present

## 2016-07-12 NOTE — Progress Notes (Signed)
EZABELLA, TESKA ([id number]) Visit Report for 07/11/2016 Chief Complaint Document Details Patient Name: [name], [name]. Date of Service: 07/11/2016 11:00 AM Medical Record Number: [id number] Patient Account Number: [id number] Date of Birth/Sex: Nov 16, 1940 (75 y.o. Female) Treating RN: Baruch Gouty, RN, BSN, Velva Harman Primary Care Provider: Glendale Chard Other Clinician: Referring Provider: Glendale Chard Treating Provider/Extender: Frann Rider in Treatment: 23 Information Obtained from: Patient Chief Complaint Ms. Rubis presents for evaluation of her left leg ulcer Electronic Signature(s) Signed: 07/11/2016 11:42:39 AM By: Christin Fudge MD, FACS Entered By: Christin Fudge on 07/11/2016 11:42:39 [name] ([id number]) -------------------------------------------------------------------------------- HPI Details Patient Name: [name]. Date of Service: 07/11/2016 11:00 AM Medical Record Number: [id number] Patient Account Number: [id number] Date of Birth/Sex: [birth year] (75 y.o. Female) Treating RN: Afful, RN, BSN, Administrator, sports Primary Care Provider: Glendale Chard Other Clinician: Referring Provider: Glendale Chard Treating Provider/Extender: Frann Rider in Treatment: 23 History of Present Illness Location: left lateral lower extremity Quality: denies pain Severity: denies pain Duration: ulcer has been present for several months Timing: denies pain Context: traumatic injury HPI Description: 76 year old patient with a past medical history of diabetes mellitus, chronic kidney disease, COPD, hypertension, rheumatoid arthritis had a lacerated wound to her left calf when she injured it on the corner of a car door and it was a long 12 cm laceration. x-ray done at the time revealed that there was no fracture or radiopaque foreign body. the wound was sutured with 4-0 Prolene simple interrupted sutures and a total of 14 sutures were applied. on August 31 she was seen in  the ER and it was noted that the wound had dehisced and she was treated with Keflex. She has completed a course of doxycycline recently Medical history also significant for status post abdominal hysterectomy, brain surgery, cardiac catheterization, EGD and bronchoscopy. of late she was back in the ER and was seen last on August 31 and they tried Silvadene 02/22/16: pt reports discomfort and mild pain just below her wound. however, she admits on legs more than usual. no redness, foul odor or purulence. the wound is measuring slightly larger today, but likely secondary to poor edema control. nursing staff gave information to pt today regarding purchase of her compression stockings. she was encouraged to purchase soon. 02/29/16: returns today for f/u. she denies systemic s/s of infection. she brought her new compression stockings today. 03/21/2016 -- She has been put on an antibiotic and prednisone for a bronchitis by her pulmonologist. 04/04/2016 -- she continues to be on prednisone. 04-18-2016 Ms. Yerkes is for evaluation of her left lateral leg ulcer. She states that she is still on a titrating dose of prednisone. She also states that she recently received a steroid injection to her left knee. She states that her blood sugars have elevated slightly with the max reading in the 130s. She denies any pain or discomfort to her left leg ulcer. She states that she has been unable to wear her compression stockings and as a result has ankle increased edema to the left leg. 05/08/2016 - Ms. Windom returns today for evaluation of her left lateral leg ulcer. She states that she has not been able to wear her compression hose due to her arthritis and pain in her hands. She denies any issues with her wound. 05/16/2016 -- venous duplex studies which I had ordered have not been done yet and I have asked her to get in touch with the office. 05/23/2016 -- venous duplex studies was done today and we  will await  the report. MARICA, TRENTHAM ([id number]) 05/30/2016 -- had a lower extremity venous duplex reflux evaluation which showed no evidence of DVT in the left femoral popliteal venous system. There was venous incompetence in the left saphenofemoral junction greater saphenous vein, common femoral vein and popliteal vein. Based on the above I would recommend a opinion from the vascular surgeons 06/27/2016 -- the patient was seen by Dr. Sherren Mocha early on 06/19/2016 and after review of her data and physical examination, he fell she did not have severe changes of venous hypertension and would get minimal if any benefit from ablation of a small segment of her great saphenous vein from mid calf to just above her knee. He had recommended knee-high compressions and elevation when possible. she returns today as she had noticed a small bruise in the area for wound and because of this she was concerned and came back for an opinion. 07/11/2016 -- she returns today with a couple of superficial abrasions and ulcerations possibly from tape burns from using bordered foam to protect the wound. Continues to have lymphedema. Electronic Signature(s) Signed: 07/11/2016 11:43:24 AM By: Christin Fudge MD, FACS Entered By: Christin Fudge on 07/11/2016 11:43:24 [name] ([id number]) -------------------------------------------------------------------------------- Physical Exam Details Patient Name: [name]. Date of Service: 07/11/2016 11:00 AM Medical Record Number: [id number] Patient Account Number: [id number] Date of Birth/Sex: [birth year] (75 y.o. Female) Treating RN: Baruch Gouty, RN, BSN, Velva Harman Primary Care Provider: Glendale Chard Other Clinician: Referring Provider: Glendale Chard Treating Provider/Extender: Frann Rider in Treatment: 23 Constitutional . Pulse regular. Respirations normal and unlabored. Afebrile. . Eyes Nonicteric. Reactive to light. Ears, Nose, Mouth, and Throat Lips, teeth,  and gums WNL.[name] Moist mucosa without lesions. Neck supple and nontender. No palpable supraclavicular or cervical adenopathy. Normal sized without goiter. Respiratory WNL. No retractions.. Breath sounds WNL, No rubs, rales, rhonchi, or wheeze.. Cardiovascular Heart rhythm and rate regular, no murmur or gallop.. Pedal Pulses WNL. No clubbing, cyanosis or edema. Chest Breasts symmetical and no nipple discharge.. Breast tissue WNL, no masses, lumps, or tenderness.. Lymphatic No adneopathy. No adenopathy. No adenopathy. Musculoskeletal Adexa without tenderness or enlargement.. Digits and nails w/o clubbing, cyanosis, infection, petechiae, ischemia, or inflammatory conditions.. Integumentary (Hair, Skin) No suspicious lesions. No crepitus or fluctuance. No peri-wound warmth or erythema. No masses.[name] Psychiatric Judgement and insight Intact.. No evidence of depression, anxiety, or agitation.. Notes the main wound is well-healed but at the periphery she has 2 areas with superficial breakdown of skin possibly from tape burns Electronic Signature(s) Signed: 07/11/2016 11:43:59 AM By: Christin Fudge MD, FACS Entered By: Christin Fudge on 07/11/2016 11:43:58 [name] ([id number]) -------------------------------------------------------------------------------- Physician Orders Details Patient Name: [name]. Date of Service: 07/11/2016 11:00 AM Medical Record Number: [id number] Patient Account Number: [id number] Date of Birth/Sex: 19-Jul-1940 (75 y.o. Female) Treating RN: Baruch Gouty, RN, BSN, Velva Harman Primary Care Provider: Glendale Chard Other Clinician: Referring Provider: Glendale Chard Treating Provider/Extender: Frann Rider in Treatment: 56 Verbal / Phone Orders: No Diagnosis Coding Wound Cleansing Wound #1 Left,Lateral Lower Leg o Clean wound with Normal Saline. Primary Wound Dressing Wound #1 Left,Lateral Lower Leg o Prisma Ag Secondary Dressing Wound #1  Left,Lateral Lower Leg o Gauze and Kerlix/Conform Dressing Change Frequency Wound #1 Left,Lateral Lower Leg o Change dressing every other day. Follow-up Appointments Wound #1 Left,Lateral Lower Leg o Return Appointment in 1 week. Edema Control Wound #1 Left,Lateral Lower Leg o Patient to wear own compression stockings o Elevate legs to the level of the  heart and pump ankles as often as possible Additional Orders / Instructions Wound #1 Left,Lateral Lower Leg o Increase protein intake. o Activity as tolerated Home Health Wound #1 Left,Lateral Lower Leg o [place] Visits - [place] Nurse may visit PRN to address patientos wound care needs. ELAINE, ROANHORSE ([id number]) o FACE TO FACE ENCOUNTER: MEDICARE and MEDICAID PATIENTS: I certify that this patient is under my care and that I had a face-to-face encounter that meets the physician face-to-face encounter requirements with this patient on this date. The encounter with the patient was in whole or in part for the following MEDICAL CONDITION: (primary reason for [place]) MEDICAL NECESSITY: I certify, that based on my findings, NURSING services are a medically necessary home health service. HOME BOUND STATUS: I certify that my clinical findings support that this patient is homebound (i.e., Due to illness or injury, pt requires aid of supportive devices such as crutches, cane, wheelchairs, walkers, the use of special transportation or the assistance of another person to leave their place of residence. There is a normal inability to leave the home and doing so requires considerable and taxing effort. Other absences are for medical reasons / religious services and are infrequent or of short duration when for other reasons). o If current dressing causes regression in wound condition, may D/C ordered dressing product/s and apply Normal Saline Moist Dressing daily until next [place] / Other MD appointment. [place] of regression in wound condition at [phone]. o Please direct any NON-WOUND related issues/requests for orders to patient's Primary Care Physician Notes order juxtalites and dual layer compression stockings heel to knee.Marland KitchenMarland KitchenMarland Kitchen44cm Electronic Signature(s) Signed: 07/11/2016 2:23:26 PM By: Regan Lemming BSN, RN Signed: 07/11/2016 4:26:35 PM By: Christin Fudge MD, FACS Entered By: Regan Lemming on 07/11/2016 11:32:43 [name] ([id number]) -------------------------------------------------------------------------------- Problem List Details Patient Name: TAKIESHA, MCDEVITT. Date of Service: 07/11/2016 11:00 AM Medical Record Number: [id number] Patient Account Number: [id number] Date of Birth/Sex: 15-Feb-1941 (75 y.o. 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 Date of Service: 07/11/2016 11:00 AM Medical Record Number: [id number] Patient Account Number: [id number] Date of Birth/Sex: 12-Oct-1940 (75 y.o. Female) Treating RN: Afful, RN, BSN, Administrator, sports Primary Care Provider: Glendale Chard Other Clinician: Referring Provider: Glendale Chard Treating Provider/Extender: Frann Rider in Treatment: 23 Subjective Chief Complaint Information obtained from Patient Ms. Maiden presents for evaluation of her left leg ulcer History of Present Illness (HPI) The following HPI elements were documented for the patient's wound: Location: left lateral lower extremity Quality: denies pain Severity: denies pain Duration: ulcer has been present for several months Timing: denies pain Context: traumatic injury 76 year old patient with a past medical history of diabetes mellitus, chronic kidney disease, COPD, hypertension, rheumatoid arthritis had a lacerated wound to her left calf when she injured it on the corner of a car  door and it was a long 12 cm laceration. x-ray done at the time revealed that there was no fracture or radiopaque foreign body. the wound was sutured with 4-0 Prolene simple interrupted sutures and a total of 14 sutures were applied. on August 31 she was seen in the ER and it was noted that the wound had dehisced and she was treated with Keflex. She has completed a course of doxycycline recently Medical history also significant for status post abdominal hysterectomy, brain surgery, cardiac catheterization, EGD and bronchoscopy. of late she was back in the ER and was seen last on August 31 and they tried Silvadene 02/22/16: pt reports discomfort and mild pain just below her wound. however, she admits on legs more than usual. no redness, foul odor or purulence. the wound is measuring slightly larger today, but likely secondary to poor edema control. nursing staff gave information to pt today regarding purchase of her compression stockings. she was encouraged to purchase soon. 02/29/16: returns today for f/u. she denies systemic s/s of infection. she brought her new compression stockings today. 03/21/2016 -- She has been put on an antibiotic and prednisone for a  bronchitis by her pulmonologist. 04/04/2016 -- she continues to be on prednisone. 04-18-2016 Ms. Sibilia is for evaluation of her left lateral leg ulcer. She states that she is still on a titrating dose of prednisone. She also states that she recently received a steroid injection to her left knee. She states that her blood sugars have elevated slightly with the max reading in the 130s. She denies any pain or discomfort to her left leg ulcer. She states that she has been unable to wear her compression stockings and as a result has ankle increased edema to the left leg. LAMARA, BRECHT ([id number]) 05/08/2016 - Ms. Oviatt returns today for evaluation of her left lateral leg ulcer. She states that she has not been able to wear her compression hose due to her arthritis and pain in her hands. She denies any issues with her wound. 05/16/2016 -- venous duplex studies which I had ordered have not been done yet and I have asked her to get in touch with the office. 05/23/2016 -- venous duplex studies was done today and we will await the report. 05/30/2016 -- had a lower extremity venous duplex reflux evaluation which showed no evidence of DVT in the left femoral popliteal venous system. There was venous incompetence in the left saphenofemoral junction greater saphenous vein, common femoral vein and popliteal vein. Based on the above I would recommend a opinion from the vascular surgeons 06/27/2016 -- the patient was seen by Dr. Sherren Mocha early on 06/19/2016 and after review of her data and physical examination, he fell she did not have severe changes of venous hypertension and would get minimal if any benefit from ablation of a small segment of her great saphenous vein from mid calf to just above her knee. He had recommended knee-high compressions and elevation when possible. she returns today as she had noticed a small bruise in the area for wound and because of this she was concerned and came back for  an opinion. 07/11/2016 -- she returns today with a couple of superficial abrasions and ulcerations possibly from tape burns from using bordered foam to protect the wound. Continues to have lymphedema. Objective Constitutional Pulse regular. Respirations normal and unlabored. Afebrile. Vitals Time Taken: 11:07 AM, Height: 64 in, Weight: 283 lbs, BMI: 48.6, Temperature: 97.6 F, Pulse: 89 bpm, Respiratory Rate: 17 breaths/min, Blood Pressure:  115/67 mmHg. Eyes Nonicteric. Reactive to light. Ears, Nose, Mouth, and Throat Lips, teeth, and gums WNL.[name] Moist mucosa without lesions. Neck supple and nontender. No palpable supraclavicular or cervical adenopathy. Normal sized without goiter. Respiratory WNL. No retractions.. Breath sounds WNL, No rubs, rales, rhonchi, or wheeze.[name] JAMELAH, SITZER ([id number]) Cardiovascular Heart rhythm and rate regular, no murmur or gallop.. Pedal Pulses WNL. No clubbing, cyanosis or edema. Chest Breasts symmetical and no nipple discharge.. Breast tissue WNL, no masses, lumps, or tenderness.. Lymphatic No adneopathy. No adenopathy. No adenopathy. Musculoskeletal Adexa without tenderness or enlargement.. Digits and nails w/o clubbing, cyanosis, infection, petechiae, ischemia, or inflammatory conditions.[name] Psychiatric Judgement and insight Intact.. No evidence of depression, anxiety, or agitation.. General Notes: the main wound is well-healed but at the periphery she has 2 areas with superficial breakdown of skin possibly from tape burns Integumentary (Hair, Skin) No suspicious lesions. No crepitus or fluctuance. No peri-wound warmth or erythema. No masses.. Wound #1 status is Open. Original cause of wound was Gradually Appeared. The wound is located on the Left,Lateral Lower Leg. The wound measures 1.5cm length x 5cm width x 0.1cm depth; 5.89cm^2 area and 0.589cm^3 volume. There is Fat Layer (Subcutaneous Tissue) Exposed exposed. There is no tunneling  or undermining noted. There is a medium amount of serosanguineous drainage noted. The wound margin is flat and intact. There is large (67-100%) pink, pale granulation within the wound bed. There is no necrotic tissue within the wound bed. The periwound skin appearance exhibited: Hemosiderin Staining, Mottled. The periwound skin appearance did not exhibit: Callus, Crepitus, Excoriation, Induration, Rash, Scarring, Dry/Scaly, Maceration, Atrophie Blanche, Cyanosis, Ecchymosis, Pallor, Rubor, Erythema. Periwound temperature was noted as No Abnormality. The periwound has tenderness on palpation. Assessment Active Problems ICD-10 E11.622 - Type 2 diabetes mellitus with other skin ulcer L97.222 - Non-pressure chronic ulcer of left calf with fat layer exposed I89.0 - Lymphedema, not elsewhere classified I83.022 - Varicose veins of left lower extremity with ulcer of calf Preble, Mariame F. ([id number]) the patient continues to have significant lymphedema and despite of using her compression stockings. Review I have recommended: 1. Using proper compression with 30-40 mm dual layer stockings and if she is not able to use those juxta lites to be used. Fresh prescriptions for this have been given 2. Elevation and exercise has been discussed in great detail 3. dressing changes to be done with Prisma AG and Kerlix and no tape to be used on her skin. 4. see her back next week Plan Wound Cleansing: Wound #1 Left,Lateral Lower Leg: Clean wound with Normal Saline. Primary Wound Dressing: Wound #1 Left,Lateral Lower Leg: Prisma Ag Secondary Dressing: Wound #1 Left,Lateral Lower Leg: Gauze and Kerlix/Conform Dressing Change Frequency: Wound #1 Left,Lateral Lower Leg: Change dressing every other day. Follow-up Appointments: Wound #1 Left,Lateral Lower Leg: Return Appointment in 1 week. Edema Control: Wound #1 Left,Lateral Lower Leg: Patient to wear own compression stockings Elevate legs to the  level of the heart and pump ankles as often as possible Additional Orders / Instructions: Wound #1 Left,Lateral Lower Leg: Increase protein intake. Activity as tolerated Home Health: Wound #1 Left,Lateral Lower Leg: Continue Home Health Visits - [place] Nurse may visit PRN to address patient s wound care needs. FACE TO FACE ENCOUNTER: MEDICARE and MEDICAID PATIENTS: I certify that this patient is under my care and that I had a face-to-face encounter that meets the physician face-to-face encounter requirements with this patient on this date. The encounter with the patient was in  whole or in part for the following MEDICAL CONDITION: (primary reason for Home Healthcare) MEDICAL NECESSITY: I certify, that based on my findings, NURSING services are a medically necessary home health service. HOME BOUND STATUS: I certify that my clinical findings support that this patient is homebound (i.e., Due to illness or injury, pt requires aid of supportive devices such as crutches, cane, wheelchairs, walkers, the use of special transportation or the assistance of another person to leave their place of residence. There is a normal inability to leave the home and doing so requires considerable and taxing effort. Other absences are JEFFIFER, RABOLD ([id number]) for medical reasons / religious services and are infrequent or of short duration when for other reasons). If current dressing causes regression in wound condition, may D/C ordered dressing product/s and apply Normal Saline Moist Dressing daily until next [place] / Other MD appointment. [place] of regression in wound condition at [phone]. Please direct any NON-WOUND related issues/requests for orders to patient's Primary Care Physician General Notes: order juxtalites and dual layer compression stockings heel to knee.Marland KitchenMarland KitchenMarland Kitchen44cm the patient continues to have significant lymphedema and despite of using her  compression stockings. Review I have recommended: 1. Using proper compression with 30-40 mm dual layer stockings and if she is not able to use those juxta lites to be used. Fresh prescriptions for this have been given 2. Elevation and exercise has been discussed in great detail 3. dressing changes to be done with Prisma AG and Kerlix and no tape to be used on her skin. 4. see her back next week Electronic Signature(s) Signed: 07/11/2016 11:46:01 AM By: Christin Fudge MD, FACS Entered By: Christin Fudge on 07/11/2016 11:46:01 [name] ([id number]) -------------------------------------------------------------------------------- SuperBill Details Patient Name: [name]. Date of Service: 07/11/2016 Medical Record Number: [id number] Patient Account Number: [id number] Date of Birth/Sex: [birth year] (76 y.o. 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 Facility Procedures CPT4 Code: [id number] Description: 31121 - WOUND CARE VISIT-LEV 3 EST PT Modifier: Quantity: 1 Physician Procedures CPT4 Code: 6244695 Description: 07225 - WC PHYS LEVEL 3 - EST PT ICD-10 Description Diagnosis E11.622 Type 2 diabetes mellitus with other skin ulcer L97.222 Non-pressure chronic ulcer of left calf with fat I89.0 Lymphedema, not elsewhere classified I83.022 Varicose veins  of left lower extremity with ulcer Modifier: layer exposed of calf Quantity: 1 Electronic Signature(s) Signed: 07/11/2016 12:47:09 PM By: Regan Lemming BSN, RN Signed:  07/11/2016 4:26:35 PM By: Christin Fudge MD, FACS Previous Signature: 07/11/2016 11:46:19 AM Version By: Christin Fudge MD, FACS Entered By: Regan Lemming on 07/11/2016 12:47:09

## 2016-07-12 NOTE — Progress Notes (Signed)
ROSELL, KHOURI ([id number]) Visit Report for 07/11/2016 Arrival Information Details Patient Name: [name], [name]. Date of Service: 07/11/2016 11:00 AM Medical Record Number: [id number] Patient Account Number: [id number] Date of Birth/Sex: 08-28-1940 (76 y.o. Female) Treating RN: Afful, RN, BSN, Velva Harman Primary Care Dixie Coppa: Glendale Chard Other Clinician: Referring Emmett Arntz: Glendale Chard Treating Jamiracle Avants/Extender: Frann Rider in Treatment: 23 Visit Information History Since Last Visit All ordered tests and consults were completed: No Patient Arrived: Gilford Rile Added or deleted any medications: No Arrival Time: 11:05 Any new allergies or adverse reactions: No Accompanied By: self Had a fall or experienced change in No Transfer Assistance: None activities of daily living that may affect Patient Identification Verified: Yes risk of falls: Secondary Verification Process Completed: Yes Signs or symptoms of abuse/neglect since last No Patient Requires Transmission-Based No visito Precautions: Hospitalized since last visit: No Patient Has Alerts: Yes Has Dressing in Place as Prescribed: Yes Patient Alerts: DM II Pain Present Now: No Electronic Signature(s) Signed: 07/11/2016 2:23:26 PM By: Regan Lemming BSN, RN Entered By: Regan Lemming on 07/11/2016 11:07:06 [name] ([id number]) -------------------------------------------------------------------------------- Clinic Level of Care Assessment Details Patient Name: [name]. Date of Service: 07/11/2016 11:00 AM Medical Record Number: [id number] Patient Account Number: [id number] Date of Birth/Sex: 1940/08/02 (76 y.o. Female) Treating RN: Afful, RN, BSN, Velva Harman Primary Care Hanley Woerner: Glendale Chard Other Clinician: Referring Zymire Turnbo: Glendale Chard Treating Avayah Raffety/Extender: Frann Rider in Treatment: 23 Clinic Level of Care Assessment Items TOOL 4 Quantity Score _0  - Use when only an EandM is  performed on FOLLOW-UP visit 0 ASSESSMENTS - Nursing Assessment / Reassessment X - Reassessment of Co-morbidities (includes updates in patient status) 1 10 X - Reassessment of Adherence to Treatment Plan 1 5 ASSESSMENTS - Wound and Skin Assessment / Reassessment X - Simple Wound Assessment / Reassessment - one wound 1 5 _1  - Complex Wound Assessment / Reassessment - multiple wounds 0 _2  - Dermatologic / Skin Assessment (not related to wound area) 0 ASSESSMENTS - Focused Assessment _3  - Circumferential Edema Measurements - multi extremities 0 _4  - Nutritional Assessment / Counseling / Intervention 0 X - Lower Extremity Assessment (monofilament, tuning fork, pulses) 1 5 _5  - Peripheral Arterial Disease Assessment (using hand held doppler) 0 ASSESSMENTS - Ostomy and/or Continence Assessment and Care _6  - Incontinence Assessment and Management 0 _7  - Ostomy Care Assessment and Management (repouching, etc.) 0 PROCESS - Coordination of Care X - Simple Patient / Family Education for ongoing care 1 15 _8  - Complex (extensive) Patient / Family Education for ongoing care 0 _9  - Staff obtains Programmer, systems, Records, Test Results / Process Orders 0 X - Staff telephones HHA, Nursing Homes / Clarify orders / etc 1 10 _10  - Routine Transfer to another Facility (non-emergent condition) 0 [name]. ([id number]) _11  - Routine Hospital Admission (non-emergent condition) 0 _12  - New Admissions / Biomedical engineer / Ordering NPWT, Apligraf, etc. 0 _13  - Emergency Hospital Admission (emergent condition) 0 _14  - Simple Discharge Coordination 0 _15  - Complex (extensive) Discharge Coordination 0 PROCESS - Special Needs _16  - Pediatric / Minor Patient Management 0 _17  - Isolation Patient Management 0 _18  - Hearing / Language / Visual special needs 0 _19  - Assessment of Community assistance (transportation, D/C planning, etc.) 0 _20  - Additional assistance / Altered mentation 0 _21  - Support Surface(s)  Assessment (bed, cushion, seat, etc.) 0 INTERVENTIONS - Wound Cleansing / Measurement X - Simple Wound Cleansing - one wound 1 5 _22  - Complex Wound Cleansing -  multiple wounds 0 X - Wound Imaging (photographs - any number of wounds) 1 5 _0  - Wound Tracing (instead of photographs) 0 X - Simple Wound Measurement - one wound 1 5 _1  - Complex Wound Measurement - multiple wounds 0 INTERVENTIONS - Wound Dressings X - Small Wound Dressing one or multiple wounds 1 10 _2  - Medium Wound Dressing one or multiple wounds 0 _3  - Large Wound Dressing one or multiple wounds 0 <MWUXLKGMWNUUVOZD>_6<\/UYQIHKVQQVZDGLOV>_5  - Application of Medications - topical 0 <IEPPIRJJOACZYSAY>_3<\/KZSWFUXNATFTDDUK>_0  - Application of Medications - injection 0 INTERVENTIONS - Miscellaneous _6  - External ear exam 0 [name]. ([id number]) _7  - Specimen Collection (cultures, biopsies, blood, body fluids, etc.) 0 _8  - Specimen(s) / Culture(s) sent or taken to Lab for analysis 0 _9  - Patient Transfer (multiple staff / Harrel Lemon Lift / Similar devices) 0 _10  - Simple Staple / Suture removal (25 or less) 0 _11  - Complex Staple / Suture removal (26 or more) 0 _12  - Hypo / Hyperglycemic Management (close monitor of Blood Glucose) 0 _13  - Ankle / Brachial Index (ABI) - do not check if billed separately 0 X - Vital Signs 1 5 Has the patient been seen at the hospital within the last three years: Yes Total Score: 80 Level Of Care: New/Established - Level 3 Electronic Signature(s) Signed: 07/11/2016 2:23:26 PM By: Regan Lemming BSN, RN Entered By: Regan Lemming on 07/11/2016 12:46:47 [name] ([id number]) -------------------------------------------------------------------------------- Encounter Discharge Information Details Patient Name: [name]. Date of Service: 07/11/2016 11:00 AM Medical Record Number: [id number] Patient Account Number: [id number] Date of Birth/Sex: 09-10-1940 (75 y.o. Female) Treating RN: Afful, RN, BSN, Velva Harman Primary Care Elam Ellis: Glendale Chard Other  Clinician: Referring Elowen Debruyn: Glendale Chard Treating Anedra Penafiel/Extender: Frann Rider in Treatment: 60 Encounter Discharge Information Items Discharge Pain Level: 0 Discharge Condition: Stable Ambulatory Status: Ambulatory Discharge Destination: Home Transportation: Private Auto Accompanied By: self Schedule Follow-up Appointment: No Medication Reconciliation completed and provided to Patient/Care No Carliss Quast: Provided on Clinical Summary of Care: 07/11/2016 Form Type Recipient Paper Patient DG Electronic Signature(s) Signed: 07/11/2016 1:00:40 PM By: Ruthine Dose Previous Signature: 07/11/2016 12:48:20 PM Version By: Regan Lemming BSN, RN Entered By: Ruthine Dose on 07/11/2016 13:00:40 [name] ([id number]) -------------------------------------------------------------------------------- Lower Extremity Assessment Details Patient Name: [name]. Date of Service: 07/11/2016 11:00 AM Medical Record Number: [id number] Patient Account Number: [id number] Date of Birth/Sex: 08-Nov-1940 (75 y.o. Female) Treating RN: Afful, RN, BSN, [place] Primary Care Mayreli Alden: Glendale Chard Other Clinician: Referring Jaelyn Bourgoin: Glendale Chard Treating Nykayla Marcelli/Extender: Frann Rider in Treatment: 23 Edema Assessment Assessed: [Left: No] [Right: No] Edema: [Left: Ye] [Right: s] Calf Left: Right: Point of Measurement: 34 cm From Medial Instep 47.8 cm cm Ankle Left: Right: Point of Measurement: 11 cm From Medial Instep 32.6 cm cm Vascular Assessment Claudication: Claudication Assessment [Left:None] Pulses: Dorsalis Pedis Palpable: [Left:Yes] Posterior Tibial Extremity colors, hair growth, and conditions: Extremity Color: [Left:Mottled] Hair Growth on Extremity: [Left:No] Temperature of Extremity: [Left:Warm] Capillary Refill: [Left:< 3 seconds] Electronic Signature(s) Signed: 07/11/2016 2:23:26 PM By: Regan Lemming BSN, RN Entered By: Regan Lemming on 07/11/2016  11:16:47 [name] ([id number]) -------------------------------------------------------------------------------- Multi Wound Chart Details Patient Name: [name]. Date of Service: 07/11/2016 11:00 AM Medical Record Number: [id number] Patient Account Number: [id number] Date of Birth/Sex: 03-Jun-1940 (75 y.o. Female) Treating RN: Baruch Gouty, RN, BSN, Velva Harman Primary Care Birtha Hatler: Glendale Chard Other Clinician: Referring Janelie Goltz: Glendale Chard Treating Wilman Tucker/Extender: Frann Rider in Treatment: 23 Vital Signs Height(in): 64 Pulse(bpm): 89 Weight(lbs): 017  Blood Pressure 115/67 (mmHg): Body Mass Index(BMI): 49 Temperature(F): 97.6 Respiratory Rate 17 (breaths/min): Photos: [1:No Photos] [N/A:N/A] Wound Location: [1:Left Lower Leg - Lateral N/A] Wounding Event: [1:Gradually Appeared] [N/A:N/A] Primary Etiology: [1:Diabetic Wound/Ulcer of N/A the Lower Extremity] Comorbid History: [1:Cataracts, Lymphedema, N/A Hypertension, Type II Diabetes, Gout, Rheumatoid Arthritis, Osteoarthritis, Neuropathy] Date Acquired: [1:06/27/2016] [N/A:N/A] Weeks of Treatment: [1:0] [N/A:N/A] Wound Status: [1:Open] [N/A:N/A] Measurements L x W x D 1.5x5x0.1 [N/A:N/A] (cm) Area (cm) : [1:5.89] [N/A:N/A] Volume (cm) : [1:0.589] [N/A:N/A] Classification: [1:Grade 1] [N/A:N/A] Exudate Amount: [1:Medium] [N/A:N/A] Exudate Type: [1:Serosanguineous] [N/A:N/A] Exudate Color: [1:red, brown] [N/A:N/A] Wound Margin: [1:Flat and Intact] [N/A:N/A] Granulation Amount: [1:Large (67-100%)] [N/A:N/A] Granulation Quality: [1:Pink, Pale] [N/A:N/A] Necrotic Amount: [1:None Present (0%)] [N/A:N/A] Exposed Structures: [1:Fat Layer (Subcutaneous N/A Tissue) Exposed: Yes Fascia: No Tendon: No Muscle: No] Joint: No Bone: No Epithelialization: None N/A N/A Periwound Skin Texture: Excoriation: No N/A N/A Induration: No Callus: No Crepitus: No Rash: No Scarring: No Periwound Skin Maceration:  No N/A N/A Moisture: Dry/Scaly: No Periwound Skin Color: Hemosiderin Staining: Yes N/A N/A Mottled: Yes Atrophie Blanche: No Cyanosis: No Ecchymosis: No Erythema: No Pallor: No Rubor: No Temperature: No Abnormality N/A N/A Tenderness on Yes N/A N/A Palpation: Wound Preparation: Ulcer Cleansing: N/A N/A Rinsed/Irrigated with Saline Topical Anesthetic Applied: Other: lidocaine 4% Treatment Notes Electronic Signature(s) Signed: 07/11/2016 11:42:33 AM By: Christin Fudge MD, FACS Entered By: Christin Fudge on 07/11/2016 11:42:33 [name] ([id number]) -------------------------------------------------------------------------------- [place] Details Patient Name: [name]. Date of Service: 07/11/2016 11:00 AM Medical Record Number: [id number] Patient Account Number: [id number] Date of Birth/Sex: [birth year] (75 y.o. Female) Treating RN: Afful, RN, BSN, Velva Harman Primary Care Keil Pickering: Glendale Chard Other Clinician: Referring Finnian Husted: Glendale Chard Treating [place] Wanner/Extender: Frann Rider in Treatment: 48 Active Inactive ` Abuse / Safety / Falls / Self Care Management Nursing Diagnoses: Impaired physical mobility Potential for falls Goals: Patient will remain injury free Date Initiated: 07/11/2016 Target Resolution Date: 10/08/2016 Goal Status: Active Interventions: Assess fall risk on admission and as needed Notes: ` Orientation to the Wound Care Program Nursing Diagnoses: Knowledge deficit related to the wound healing center program Goals: Patient/caregiver will verbalize understanding of the [place] Date Initiated: 07/11/2016 Target Resolution Date: 10/08/2016 Goal Status: Active Interventions: Provide education on orientation to the wound center Notes: ` Venous Leg Ulcer Nursing Diagnoses: Knowledge deficit related to disease process and management MONZERRATH, MCBURNEY ([id number]) Potential for  venous Insuffiency (use before diagnosis confirmed) Goals: Patient will maintain optimal edema control Date Initiated: 07/11/2016 Target Resolution Date: 10/08/2016 Goal Status: Active Patient/caregiver will verbalize understanding of disease process and disease management Date Initiated: 07/11/2016 Target Resolution Date: 10/08/2016 Goal Status: Active Verify adequate tissue perfusion prior to therapeutic compression application Date Initiated: 07/11/2016 Target Resolution Date: 10/08/2016 Goal Status: Active Interventions: Assess peripheral edema status every visit. Compression as ordered Provide education on venous insufficiency Treatment Activities: Therapeutic compression applied : 07/11/2016 Notes: ` Wound/Skin Impairment Nursing Diagnoses: Impaired tissue integrity Goals: Patient/caregiver will verbalize understanding of skin care regimen Date Initiated: 07/11/2016 Target Resolution Date: 10/08/2016 Goal Status: Active Ulcer/skin breakdown will have a volume reduction of 30% by week 4 Date Initiated: 07/11/2016 Target Resolution Date: 10/08/2016 Goal Status: Active Ulcer/skin breakdown will have a volume reduction of 50% by week 8 Date Initiated: 07/11/2016 Target Resolution Date: 10/08/2016 Goal Status: Active Ulcer/skin breakdown will have a volume reduction of 80% by week 12 Date Initiated: 07/11/2016 Target Resolution Date: 10/08/2016 Goal Status: Active Ulcer/skin breakdown  will heal within 14 weeks Date Initiated: 07/11/2016 Target Resolution Date: 10/08/2016 Goal Status: Active ZHARIA, CONROW ([id number]) Interventions: Assess patient/caregiver ability to obtain necessary supplies Assess patient/caregiver ability to perform ulcer/skin care regimen upon admission and as needed Assess ulceration(s) every visit Notes: Electronic Signature(s) Signed: 07/11/2016 2:23:26 PM By: Regan Lemming BSN, RN Entered By: Regan Lemming on 07/11/2016 11:24:43 [name]  ([id number]) -------------------------------------------------------------------------------- Pain Assessment Details Patient Name: [name]. Date of Service: 07/11/2016 11:00 AM Medical Record Number: [id number] Patient Account Number: [id number] Date of Birth/Sex: [birth year] (75 y.o. Female) Treating RN: Afful, RN, BSN, Velva Harman Primary Care Malakai Schoenherr: Glendale Chard Other Clinician: Referring Oprah Camarena: Glendale Chard Treating Parys Elenbaas/Extender: Frann Rider in Treatment: 23 Active Problems Location of Pain Severity and Description of Pain Patient Has Paino No Site Locations With Dressing Change: No Pain Management and Medication Current Pain Management: Electronic Signature(s) Signed: 07/11/2016 2:23:26 PM By: Regan Lemming BSN, RN Entered By: Regan Lemming on 07/11/2016 11:07:12 [name] ([id number]) -------------------------------------------------------------------------------- Patient/Caregiver Education Details Patient Name: [name]. Date of Service: 07/11/2016 11:00 AM Medical Record Number: [id number] Patient Account Number: [id number] Date of Birth/Gender: 07-15-1940 (76 y.o. Female) Treating RN: Baruch Gouty, RN, BSN, Velva Harman Primary Care Physician: Glendale Chard Other Clinician: Referring Physician: Glendale Chard Treating Physician/Extender: Frann Rider in Treatment: 71 Education Assessment Education Provided To: Patient Education Topics Provided Venous: Methods: Explain/Verbal Responses: State content correctly Welcome To [facility] [place]: Methods: Explain/Verbal Responses: State content correctly Wound/Skin Impairment: Methods: Explain/Verbal Responses: State content correctly Electronic Signature(s) Signed: 07/11/2016 2:23:26 PM By: Regan Lemming BSN, RN Entered By: Regan Lemming on 07/11/2016 12:48:45 [name] ([id number]) -------------------------------------------------------------------------------- Wound  Assessment Details Patient Name: [name]. Date of Service: 07/11/2016 11:00 AM Medical Record Number: [id number] Patient Account Number: [id number] Date of Birth/Sex: 1940/09/16 (75 y.o. Female) Treating RN: Afful, RN, BSN, [place] Primary Care Chrystine Frogge: Glendale Chard Other Clinician: Referring Luiscarlos Kaczmarczyk: Glendale Chard Treating Sammy Douthitt/Extender: Frann Rider in Treatment: 23 Wound Status Wound Number: 1 Primary Diabetic Wound/Ulcer of the Lower Etiology: Extremity Wound Location: Left Lower Leg - Lateral Wound Open Wounding Event: Gradually Appeared Status: Date Acquired: 06/27/2016 Comorbid Cataracts, Lymphedema, Hypertension, Weeks Of Treatment: 0 History: Type II Diabetes, Gout, Rheumatoid Clustered Wound: No Arthritis, Osteoarthritis, Neuropathy Photos Photo Uploaded By: Regan Lemming on 07/11/2016 14:16:52 Wound Measurements Length: (cm) 1.5 Width: (cm) 5 Depth: (cm) 0.1 Area: (cm) 5.89 Volume: (cm) 0.589 % Reduction in Area: % Reduction in Volume: Epithelialization: None Tunneling: No Undermining: No Wound Description Classification: Grade 1 Foul Odor Aft Wound Margin: Flat and Intact Slough/Fibrin Exudate Amount: Medium Exudate Type: LANEICE, MENEELY ([id number]) er Cleansing: No o No Exudate Color: red, brown Wound Bed Granulation Amount: Large (67-100%) Exposed Structure Granulation Quality: Pink, Pale Fascia Exposed: No Necrotic Amount: None Present (0%) Fat Layer (Subcutaneous Tissue) Exposed: Yes Tendon Exposed: No Muscle Exposed: No Joint Exposed: No Bone Exposed: No Periwound Skin Texture Texture Color No Abnormalities Noted: No No Abnormalities Noted: No Callus: No Atrophie Blanche: No Crepitus: No Cyanosis: No Excoriation: No Ecchymosis: No Induration: No Erythema: No Rash: No Hemosiderin Staining: Yes Scarring: No Mottled: Yes Pallor: No Moisture Rubor: No No Abnormalities Noted: No Dry /  Scaly: No Temperature / Pain Maceration: No Temperature: No Abnormality Tenderness on Palpation: Yes Wound Preparation Ulcer Cleansing: Rinsed/Irrigated with Saline Topical Anesthetic Applied: Other: lidocaine 4%, Treatment Notes Wound #1 (Left, Lateral Lower Leg) 1. Cleansed with: Clean wound with Normal Saline 4. Dressing Applied: Prisma  Ag 5. Secondary Dressing Applied Gauze and Kerlix/Conform 7. Secured with Recruitment consultant) Signed: 07/11/2016 2:23:26 PM By: Regan Lemming BSN, RN Entered By: Regan Lemming on 07/11/2016 11:10:47 [name] ([id number]) -------------------------------------------------------------------------------- Vitals Details Patient Name: [name]. Date of Service: 07/11/2016 11:00 AM Medical Record Number: [id number] Patient Account Number: [id number] Date of Birth/Sex: 10/13/1940 (75 y.o. Female) Treating RN: Afful, RN, BSN, [place] Primary Care Kalev Temme: Glendale Chard Other Clinician: Referring Jorita Bohanon: Glendale Chard Treating Malena Timpone/Extender: Frann Rider in Treatment: 23 Vital Signs Time Taken: 11:07 Temperature (F): 97.6 Height (in): 64 Pulse (bpm): 89 Weight (lbs): 283 Respiratory Rate (breaths/min): 17 Body Mass Index (BMI): 48.6 Blood Pressure (mmHg): 115/67 Reference Range: 80 - 120 mg / dl Electronic Signature(s) Signed: 07/11/2016 2:23:26 PM By: Regan Lemming BSN, RN Entered By: Regan Lemming on 07/11/2016 11:07:49

## 2016-07-14 ENCOUNTER — Encounter: Payer: Self-pay | Admitting: Registered Nurse

## 2016-07-14 ENCOUNTER — Encounter: Payer: Medicare Other | Attending: Physical Medicine & Rehabilitation | Admitting: Registered Nurse

## 2016-07-14 VITALS — BP 109/69 | HR 87

## 2016-07-14 DIAGNOSIS — J8410 Pulmonary fibrosis, unspecified: Secondary | ICD-10-CM | POA: Insufficient documentation

## 2016-07-14 DIAGNOSIS — Z833 Family history of diabetes mellitus: Secondary | ICD-10-CM | POA: Diagnosis not present

## 2016-07-14 DIAGNOSIS — Z803 Family history of malignant neoplasm of breast: Secondary | ICD-10-CM | POA: Diagnosis not present

## 2016-07-14 DIAGNOSIS — Z8249 Family history of ischemic heart disease and other diseases of the circulatory system: Secondary | ICD-10-CM | POA: Diagnosis not present

## 2016-07-14 DIAGNOSIS — E079 Disorder of thyroid, unspecified: Secondary | ICD-10-CM | POA: Insufficient documentation

## 2016-07-14 DIAGNOSIS — Z801 Family history of malignant neoplasm of trachea, bronchus and lung: Secondary | ICD-10-CM | POA: Diagnosis not present

## 2016-07-14 DIAGNOSIS — M1712 Unilateral primary osteoarthritis, left knee: Secondary | ICD-10-CM | POA: Diagnosis not present

## 2016-07-14 DIAGNOSIS — E7800 Pure hypercholesterolemia, unspecified: Secondary | ICD-10-CM | POA: Diagnosis not present

## 2016-07-14 DIAGNOSIS — E1140 Type 2 diabetes mellitus with diabetic neuropathy, unspecified: Secondary | ICD-10-CM

## 2016-07-14 DIAGNOSIS — M2550 Pain in unspecified joint: Secondary | ICD-10-CM

## 2016-07-14 DIAGNOSIS — Z79899 Other long term (current) drug therapy: Secondary | ICD-10-CM | POA: Diagnosis not present

## 2016-07-14 DIAGNOSIS — G894 Chronic pain syndrome: Secondary | ICD-10-CM | POA: Diagnosis not present

## 2016-07-14 DIAGNOSIS — M62838 Other muscle spasm: Secondary | ICD-10-CM | POA: Diagnosis not present

## 2016-07-14 DIAGNOSIS — M545 Low back pain: Secondary | ICD-10-CM | POA: Insufficient documentation

## 2016-07-14 DIAGNOSIS — M7062 Trochanteric bursitis, left hip: Secondary | ICD-10-CM

## 2016-07-14 DIAGNOSIS — M069 Rheumatoid arthritis, unspecified: Secondary | ICD-10-CM | POA: Diagnosis not present

## 2016-07-14 DIAGNOSIS — E1142 Type 2 diabetes mellitus with diabetic polyneuropathy: Secondary | ICD-10-CM | POA: Insufficient documentation

## 2016-07-14 DIAGNOSIS — E1122 Type 2 diabetes mellitus with diabetic chronic kidney disease: Secondary | ICD-10-CM | POA: Insufficient documentation

## 2016-07-14 DIAGNOSIS — M1711 Unilateral primary osteoarthritis, right knee: Secondary | ICD-10-CM

## 2016-07-14 DIAGNOSIS — Z808 Family history of malignant neoplasm of other organs or systems: Secondary | ICD-10-CM | POA: Diagnosis not present

## 2016-07-14 DIAGNOSIS — I129 Hypertensive chronic kidney disease with stage 1 through stage 4 chronic kidney disease, or unspecified chronic kidney disease: Secondary | ICD-10-CM | POA: Insufficient documentation

## 2016-07-14 DIAGNOSIS — F329 Major depressive disorder, single episode, unspecified: Secondary | ICD-10-CM | POA: Insufficient documentation

## 2016-07-14 DIAGNOSIS — Z9889 Other specified postprocedural states: Secondary | ICD-10-CM | POA: Insufficient documentation

## 2016-07-14 DIAGNOSIS — M170 Bilateral primary osteoarthritis of knee: Secondary | ICD-10-CM | POA: Diagnosis not present

## 2016-07-14 DIAGNOSIS — Z8051 Family history of malignant neoplasm of kidney: Secondary | ICD-10-CM | POA: Diagnosis not present

## 2016-07-14 DIAGNOSIS — J449 Chronic obstructive pulmonary disease, unspecified: Secondary | ICD-10-CM | POA: Insufficient documentation

## 2016-07-14 DIAGNOSIS — M48062 Spinal stenosis, lumbar region with neurogenic claudication: Secondary | ICD-10-CM | POA: Diagnosis not present

## 2016-07-14 DIAGNOSIS — R5382 Chronic fatigue, unspecified: Secondary | ICD-10-CM

## 2016-07-14 DIAGNOSIS — Z5181 Encounter for therapeutic drug level monitoring: Secondary | ICD-10-CM

## 2016-07-14 DIAGNOSIS — D689 Coagulation defect, unspecified: Secondary | ICD-10-CM | POA: Diagnosis not present

## 2016-07-14 DIAGNOSIS — R200 Anesthesia of skin: Secondary | ICD-10-CM | POA: Diagnosis not present

## 2016-07-14 DIAGNOSIS — M1288 Other specific arthropathies, not elsewhere classified, other specified site: Secondary | ICD-10-CM | POA: Diagnosis not present

## 2016-07-14 DIAGNOSIS — N189 Chronic kidney disease, unspecified: Secondary | ICD-10-CM | POA: Diagnosis not present

## 2016-07-14 DIAGNOSIS — M47816 Spondylosis without myelopathy or radiculopathy, lumbar region: Secondary | ICD-10-CM

## 2016-07-14 MED ORDER — MORPHINE SULFATE ER 15 MG PO TBCR: 15.0000 mg | EXTENDED_RELEASE_TABLET | Freq: Two times a day (BID) | ORAL | 0 refills | Status: DC

## 2016-07-14 MED ORDER — DULOXETINE HCL 60 MG PO CPEP: 60.0000 mg | ORAL_CAPSULE | Freq: Every day | ORAL | 4 refills | Status: DC

## 2016-07-14 NOTE — Progress Notes (Signed)
Subjective:    Patient ID: [name], female    DOB: August 13, 1940, 76 y.o.   MRN: [id number]  HPI: [name] is a 76 year old female who returns for follow up appointment for chronic pain and medication refill. She states her pain is located in her bilateral hands R>L, lower back radiating into her left hip, left lower extremity wound pain and bilateral knee pain. [name] receiving wound care and Home Health service for left lower extremity wound, bandage intact.  She ratesher pain 6. Her current exercise regime is performing chair exercises and walking.   [name] states she has lost 30 lbs over the last year.    Pain Inventory Average Pain 6 Pain Right Now 6 My pain is intermittent, sharp and burning  In the last 24 hours, has pain interfered with the following? General activity 5 Relation with others 5 Enjoyment of life 5 What TIME of day is your pain at its worst? morning Sleep (in general) .  Pain is worse with: walking and standing Pain improves with: rest and heat/ice Relief from Meds: 6  Mobility use a cane use a walker ability to climb steps?  no  Function retired  Neuro/Psych bladder control problems tingling trouble walking  Prior Studies Any changes since last visit?  no  Physicians involved in your care Any changes since last visit?  no   Family History  Problem Relation Age of Onset  . Diabetes Mother   . Heart attack Mother   . Hypertension Father   . Lung cancer Father   . Diabetes Sister   . Diabetes Brother   . Hypertension Brother   . Heart disease Brother   . Heart attack Brother   . Kidney cancer Brother   . Uterine cancer Daughter   . Breast cancer Sister   . Rheum arthritis Maternal Uncle   . Gout Brother   . Kidney failure Brother     x 5   Social History   Social History  . Marital status: Married    Spouse name: N/A  . Number of children: 6  . Years of education: college   Occupational  History  . retired Marine scientist    Social History Main Topics  . Smoking status: Never Smoker  . Smokeless tobacco: Never Used  . Alcohol use No  . Drug use: No  . Sexual activity: Not Currently   Other Topics Concern  . Not on file   Social History Narrative   Patient consumes 2-3 cups coffee per day.   Past Surgical History:  Procedure Laterality Date  . ABDOMINAL HYSTERECTOMY    . BRAIN SURGERY     Gamma knife 10/13. Needs repeat spring  '14  . CARDIAC CATHETERIZATION N/A 10/31/2014   Procedure: Right/Left Heart Cath and Coronary Angiography;  Surgeon: [name], MD;  Location: [facility];  Service: Cardiovascular;  Laterality: N/A;  . ESOPHAGOGASTRODUODENOSCOPY (EGD) WITH PROPOFOL N/A 09/14/2014   Procedure: ESOPHAGOGASTRODUODENOSCOPY (EGD) WITH PROPOFOL;  Surgeon: [name], MD;  Location: [facility];  Service: Endoscopy;  Laterality: N/A;  . OVARY SURGERY    . SHOULDER SURGERY Left   . TONSILLECTOMY  age 30  . VIDEO BRONCHOSCOPY Bilateral 05/31/2013   Procedure: VIDEO BRONCHOSCOPY WITHOUT FLUORO;  Surgeon: Brand Males, MD;  Location: [facility];  Service: Cardiopulmonary;  Laterality: Bilateral;  . video bronscoscopy  2000   lung   Past Medical History:  Diagnosis Date  . Arthritis  endstage changes bilateral knees/bilateral ankles.   . Asthma   . Carotid artery occlusion   . Chronic kidney disease   . Clotting disorder ([place])    pt denies this  . Contusion of left knee    due to fall 1/14.  [name] COPD (chronic obstructive pulmonary disease) (HCC)    pulmonary fibrosis  . Depression, reactive   . Diabetes mellitus   . Diastolic dysfunction   . Family history of heart disease   . High cholesterol   . Hypertension   . Interstitial lung disease ([place])   . 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    . Morbid obesity ([place])   . Normal coronary arteries      cardiac catheterization performed  10/31/14  . RA (rheumatoid arthritis) ([place])    has been off methotreaxte since 10/13.  [name] Shortness of breath   . Thyroid disease   . URI (upper respiratory infection)    There were no vitals taken for this visit.  Opioid Risk Score:   Fall Risk Score:  `1  Depression screen PHQ 2/9  Depression screen [facility] 2/9 08/09/2015 01/05/2015 09/20/2014 08/10/2013  Decreased Interest _0 -  Down, Depressed, Hopeless _1 PHQ - 2 Score _2 Altered sleeping - 2 0 -  Tired, decreased energy - 2 3 -  Change in appetite - 2 3 -  Feeling bad or failure about yourself  - 0 3 -  Trouble concentrating - 1 1 -  Moving slowly or fidgety/restless - 0 2 -  Suicidal thoughts - 0 0 -  PHQ-9 Score - 10 16 -  Difficult doing work/chores - Somewhat difficult - -  Some recent data might be hidden    Review of Systems  Constitutional: Negative.   HENT: Negative.   Respiratory: Positive for cough, shortness of breath and wheezing.   Cardiovascular: Negative.   Gastrointestinal: Negative.   Endocrine: Negative.   Genitourinary: Negative.   Musculoskeletal: Positive for gait problem and joint swelling.  Skin: Negative.   Allergic/Immunologic: Negative.   Hematological: Negative.   Psychiatric/Behavioral: Negative.   All other systems reviewed and are negative.      Objective:   Physical Exam  Constitutional: She is oriented to person, place, and time. She appears well-developed and well-nourished.  HENT:  Head: Normocephalic and atraumatic.  Neck: Normal range of motion. Neck supple.  Cardiovascular: Normal rate and regular rhythm.   Pulmonary/Chest: Effort normal and breath sounds normal.  Musculoskeletal:  Normal Muscle Bulk and Muscle testing Reveals: Upper Extremities: Full ROM and Muscle Strength 5/5 Thoracic Paraspinal Tenderness: T-11-T-12 Lumbar Hypersensitivity Lower Extremities: Full ROM and Muscle Strength 5/5 Left Lower Extremity Flexion  Produces Pain into Left Patella Wearing compression stockings Arises from Table Slowly using walker for support Narrow Based Gait   Neurological: She is alert and oriented to person, place, and time.  Skin: Skin is warm and dry.  Psychiatric: She has a normal mood and affect.  Nursing note and vitals reviewed.         Assessment & Plan:  1. Lumbar spinal stenosis with neurogenic claudication. Associated facet arthropathy: Continue Gabapentinand HEP as tolerated. 07/14/2016 Refilled : MS Contin 15 mg one tablet every 12 hours #60 andcontinueMSIR 15 mg 1 one tablet every 8hours as needed #90, No script given.  We will continue the opioid monitoring program, this  consists of regular clinic visits, examinations, urine drug screen, pill counts as well as use of [place] Controlled Substance reporting System. 2. Depression: Continue Cymbalta. 07/14/2016 3. Diabetes mellitus type 2 with polyneuropathy: Continue Gabapentin. 07/14/2016 4. Rheumatoid arthritis and osteoarthritis. Contnue Voltaren Gel. Rheumatology Following. 07/14/2016 5. Interstitial lung disease: Continuous Oxygen and Pulmonology Following. 07/14/2016 6. Bilateral Osteoarthritis Knee's: Continue Voltaren Gel.07/14/2016 7. Muscle Spasm: Continue : Flexeril. 07/14/2016 8. Left Greater Trochanteric tenderness: Continue with Ice/Heat Therapy. 07/14/2016 9. Polyarthralgia: Continue current plan: Continue to monitor. 07/14/2016 10. Morbid Obesitity: Continue Healthy Diet Regime and HEP. She has lost 30lbs over the last year, she states.   30 minutes of face to face patient care time was spent during this visit. All questions were encouraged and answered.  F/U in 1 month

## 2016-07-15 DIAGNOSIS — E1122 Type 2 diabetes mellitus with diabetic chronic kidney disease: Secondary | ICD-10-CM | POA: Diagnosis not present

## 2016-07-15 DIAGNOSIS — T798XXD Other early complications of trauma, subsequent encounter: Secondary | ICD-10-CM | POA: Diagnosis not present

## 2016-07-15 DIAGNOSIS — S81812D Laceration without foreign body, left lower leg, subsequent encounter: Secondary | ICD-10-CM | POA: Diagnosis not present

## 2016-07-15 DIAGNOSIS — I5032 Chronic diastolic (congestive) heart failure: Secondary | ICD-10-CM | POA: Diagnosis not present

## 2016-07-15 DIAGNOSIS — N182 Chronic kidney disease, stage 2 (mild): Secondary | ICD-10-CM | POA: Diagnosis not present

## 2016-07-15 DIAGNOSIS — I130 Hypertensive heart and chronic kidney disease with heart failure and stage 1 through stage 4 chronic kidney disease, or unspecified chronic kidney disease: Secondary | ICD-10-CM | POA: Diagnosis not present

## 2016-07-17 ENCOUNTER — Encounter: Payer: Medicare Other | Attending: Surgery | Admitting: Surgery

## 2016-07-17 DIAGNOSIS — I890 Lymphedema, not elsewhere classified: Secondary | ICD-10-CM | POA: Insufficient documentation

## 2016-07-17 DIAGNOSIS — J449 Chronic obstructive pulmonary disease, unspecified: Secondary | ICD-10-CM | POA: Insufficient documentation

## 2016-07-17 DIAGNOSIS — N189 Chronic kidney disease, unspecified: Secondary | ICD-10-CM | POA: Insufficient documentation

## 2016-07-17 DIAGNOSIS — M069 Rheumatoid arthritis, unspecified: Secondary | ICD-10-CM | POA: Diagnosis not present

## 2016-07-17 DIAGNOSIS — Z6841 Body Mass Index (BMI) 40.0 and over, adult: Secondary | ICD-10-CM | POA: Diagnosis not present

## 2016-07-17 DIAGNOSIS — L97222 Non-pressure chronic ulcer of left calf with fat layer exposed: Secondary | ICD-10-CM | POA: Insufficient documentation

## 2016-07-17 DIAGNOSIS — I83022 Varicose veins of left lower extremity with ulcer of calf: Secondary | ICD-10-CM | POA: Insufficient documentation

## 2016-07-17 DIAGNOSIS — E1122 Type 2 diabetes mellitus with diabetic chronic kidney disease: Secondary | ICD-10-CM | POA: Diagnosis not present

## 2016-07-17 DIAGNOSIS — E11622 Type 2 diabetes mellitus with other skin ulcer: Secondary | ICD-10-CM | POA: Diagnosis not present

## 2016-07-17 DIAGNOSIS — I129 Hypertensive chronic kidney disease with stage 1 through stage 4 chronic kidney disease, or unspecified chronic kidney disease: Secondary | ICD-10-CM | POA: Insufficient documentation

## 2016-07-17 DIAGNOSIS — Z79899 Other long term (current) drug therapy: Secondary | ICD-10-CM | POA: Insufficient documentation

## 2016-07-17 DIAGNOSIS — E1140 Type 2 diabetes mellitus with diabetic neuropathy, unspecified: Secondary | ICD-10-CM | POA: Diagnosis not present

## 2016-07-18 ENCOUNTER — Ambulatory Visit: Payer: Medicare Other | Admitting: Surgery

## 2016-07-18 NOTE — Progress Notes (Signed)
[name], [name] ([id number]) Visit Report for 07/17/2016 Chief Complaint Document Details Patient Name: Joan, Mcdaniel. Date of Service: 07/17/2016 8:15 AM Medical Record Number: [id number] Patient Account Number: [id number] Date of Birth/Sex: [birth year] (76 y.o. Female) Treating RN: Baruch Gouty, RN, BSN, Velva Harman Primary Care Provider: Glendale Chard Other Clinician: Referring Provider: Glendale Chard Treating Provider/Extender: Frann Rider in Treatment: 23 Information Obtained from: Patient Chief Complaint [name] presents for evaluation of her left leg ulcer Electronic Signature(s) Signed: 07/17/2016 10:04:06 AM By: Christin Fudge MD, FACS Entered By: Christin Fudge on 07/17/2016 10:04:06 [name] ([id number]) -------------------------------------------------------------------------------- HPI Details Patient Name: [name]. Date of Service: 07/17/2016 8:15 AM Medical Record Number: [id number] Patient Account Number: [id number] Date of Birth/Sex: [birth year] (76 y.o. Female) Treating RN: Afful, RN, BSN, Administrator, sports Primary Care Provider: Glendale Chard Other Clinician: Referring Provider: Glendale Chard Treating Provider/Extender: Frann Rider in Treatment: 23 History of Present Illness Location: left lateral lower extremity Quality: denies pain Severity: denies pain Duration: ulcer has been present for several months Timing: denies pain Context: traumatic injury HPI Description: 76 year old patient with a past medical history of diabetes mellitus, chronic kidney disease, COPD, hypertension, rheumatoid arthritis had a lacerated wound to her left calf when she injured it on the corner of a car door and it was a long 12 cm laceration. x-ray done at the time revealed that there was no fracture or radiopaque foreign body. the wound was sutured with 4-0 Prolene simple interrupted sutures and a total of 14 sutures were applied. on August 31 she was seen in the  ER and it was noted that the wound had dehisced and she was treated with Keflex. She has completed a course of doxycycline recently Medical history also significant for status post abdominal hysterectomy, brain surgery, cardiac catheterization, EGD and bronchoscopy. of late she was back in the ER and was seen last on August 31 and they tried Silvadene 02/22/16: pt reports discomfort and mild pain just below her wound. however, she admits on legs more than usual. no redness, foul odor or purulence. the wound is measuring slightly larger today, but likely secondary to poor edema control. nursing staff gave information to pt today regarding purchase of her compression stockings. she was encouraged to purchase soon. 02/29/16: returns today for f/u. she denies systemic s/s of infection. she brought her new compression stockings today. 03/21/2016 -- She has been put on an antibiotic and prednisone for a bronchitis by her pulmonologist. 04/04/2016 -- she continues to be on prednisone. 04-18-2016 [name] is for evaluation of her left lateral leg ulcer. She states that she is still on a titrating dose of prednisone. She also states that she recently received a steroid injection to her left knee. She states that her blood sugars have elevated slightly with the max reading in the 130s. She denies any pain or discomfort to her left leg ulcer. She states that she has been unable to wear her compression stockings and as a result has ankle increased edema to the left leg. 05/08/2016 - [name] returns today for evaluation of her left lateral leg ulcer. She states that she has not been able to wear her compression hose due to her arthritis and pain in her hands. She denies any issues with her wound. 05/16/2016 -- venous duplex studies which I had ordered have not been done yet and I have asked her to get in touch with the office. 05/23/2016 -- venous duplex studies was done today and we  will await the  report. [name], [name] ([id number]) 05/30/2016 -- had a lower extremity venous duplex reflux evaluation which showed no evidence of DVT in the left femoral popliteal venous system. There was venous incompetence in the left saphenofemoral junction greater saphenous vein, common femoral vein and popliteal vein. Based on the above I would recommend a opinion from the vascular surgeons 06/27/2016 -- the patient was seen by Dr. Sherren Mocha early on 06/19/2016 and after review of her data and physical examination, he fell she did not have severe changes of venous hypertension and would get minimal if any benefit from ablation of a small segment of her great saphenous vein from mid calf to just above her knee. He had recommended knee-high compressions and elevation when possible. she returns today as she had noticed a small bruise in the area for wound and because of this she was concerned and came back for an opinion. 07/11/2016 -- she returns today with a couple of superficial abrasions and ulcerations possibly from tape burns from using bordered foam to protect the wound. Continues to have lymphedema. Electronic Signature(s) Signed: 07/17/2016 10:04:36 AM By: Christin Fudge MD, FACS Entered By: Christin Fudge on 07/17/2016 10:04:35 [name] ([id number]) -------------------------------------------------------------------------------- Physical Exam Details Patient Name: [name]. Date of Service: 07/17/2016 8:15 AM Medical Record Number: [id number] Patient Account Number: [id number] Date of Birth/Sex: May 04, 1941 (76 y.o. Female) Treating RN: Baruch Gouty, RN, BSN, Velva Harman Primary Care Provider: Glendale Chard Other Clinician: Referring Provider: Glendale Chard Treating Provider/Extender: Frann Rider in Treatment: 23 Constitutional . Pulse regular. Respirations normal and unlabored. Afebrile. . Eyes Nonicteric. Reactive to light. Ears, Nose, Mouth, and Throat Lips, teeth, and gums  WNL.[name] Moist mucosa without lesions. Neck supple and nontender. No palpable supraclavicular or cervical adenopathy. Normal sized without goiter. Respiratory WNL. No retractions.. Breath sounds WNL, No rubs, rales, rhonchi, or wheeze.. Cardiovascular Heart rhythm and rate regular, no murmur or gallop.. Pedal Pulses WNL. No clubbing, cyanosis or edema. Chest Breasts symmetical and no nipple discharge.. Breast tissue WNL, no masses, lumps, or tenderness.. Lymphatic No adneopathy. No adenopathy. No adenopathy. Musculoskeletal Adexa without tenderness or enlargement.. Digits and nails w/o clubbing, cyanosis, infection, petechiae, ischemia, or inflammatory conditions.. Integumentary (Hair, Skin) No suspicious lesions. No crepitus or fluctuance. No peri-wound warmth or erythema. No masses.[name] Psychiatric Judgement and insight Intact.. No evidence of depression, anxiety, or agitation.. Notes the 2 areas where she's had superficial breakdown of her skin is looking much better and that [place] very tiny area still open Electronic Signature(s) Signed: 07/17/2016 10:05:00 AM By: Christin Fudge MD, FACS Entered By: Christin Fudge on 07/17/2016 10:04:59 [name] ([id number]) -------------------------------------------------------------------------------- Physician Orders Details Patient Name: [name]. Date of Service: 07/17/2016 8:15 AM Medical Record Number: [id number] Patient Account Number: [id number] Date of Birth/Sex: 04-26-1941 (76 y.o. Female) Treating RN: Baruch Gouty, RN, BSN, Velva Harman Primary Care Provider: Glendale Chard Other Clinician: Referring Provider: Glendale Chard Treating Provider/Extender: Frann Rider in Treatment: 49 Verbal / Phone Orders: No Diagnosis Coding Wound Cleansing Wound #1 Left,Lateral Lower Leg o Clean wound with Normal Saline. Primary Wound Dressing Wound #1 Left,Lateral Lower Leg o Prisma Ag Secondary Dressing Wound #1 Left,Lateral Lower  Leg o Gauze and Kerlix/Conform o Boardered Foam Dressing Dressing Change Frequency Wound #1 Left,Lateral Lower Leg o Change dressing every other day. Follow-up Appointments Wound #1 Left,Lateral Lower Leg o Return Appointment in 1 week. Edema Control Wound #1 Left,Lateral Lower Leg o Patient to wear own compression stockings o Elevate legs  to the level of the heart and pump ankles as often as possible Additional Orders / Instructions Wound #1 Left,Lateral Lower Leg o Increase protein intake. o Activity as tolerated Home Health Wound #1 Left,Lateral Lower Leg o FACE TO FACE ENCOUNTER: MEDICARE and MEDICAID PATIENTS: I certify that this patient is under my care and that I had a face-to-face encounter that meets the physician face-to-face [name], [name] ([id number]) encounter requirements with this patient on this date. The encounter with the patient was in whole or in part for the following MEDICAL CONDITION: (primary reason for [place]) MEDICAL NECESSITY: I certify, that based on my findings, NURSING services are a medically necessary home health service. HOME BOUND STATUS: I certify that my clinical findings support that this patient is homebound (i.e., Due to illness or injury, pt requires aid of supportive devices such as crutches, cane, wheelchairs, walkers, the use of special transportation or the assistance of another person to leave their place of residence. There is a normal inability to leave the home and doing so requires considerable and taxing effort. Other absences are for medical reasons / religious services and are infrequent or of short duration when for other reasons). o D/C Home Health Services Electronic Signature(s) Signed: 07/17/2016 10:07:11 AM By: Regan Lemming BSN, RN Signed: 07/17/2016 4:14:08 PM By: Christin Fudge MD, FACS Entered By: Regan Lemming on 07/17/2016 08:33:50 [name]  ([id number]) -------------------------------------------------------------------------------- Problem List Details Patient Name: [name], [name]. Date of Service: 07/17/2016 8:15 AM Medical Record Number: [id number] Patient Account Number: [id number] Date of Birth/Sex: [birth year] (76 y.o. 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 Date of Service: 07/17/2016 8:15 AM Medical Record Number: [id number] Patient Account Number: [id number] Date of Birth/Sex: [birth year] (76 y.o. Female) Treating RN: Afful, RN, BSN, Administrator, sports Primary Care Provider: Glendale Chard Other Clinician: Referring Provider: Glendale Chard Treating Provider/Extender: Frann Rider in Treatment: 23 Subjective Chief Complaint Information obtained from Patient Ms. Leason presents for evaluation of her left leg ulcer History of Present Illness (HPI) The following HPI elements were documented for the patient's wound: Location: left lateral lower extremity Quality: denies pain Severity: denies pain Duration: ulcer has been present for several months Timing: denies  pain Context: traumatic injury 76 year old patient with a past medical history of diabetes mellitus, chronic kidney disease, COPD, hypertension, rheumatoid arthritis had a lacerated wound to her left calf when she injured it on the corner of a car door and it was a long 12 cm laceration. x-ray done at the time revealed that there was no fracture or radiopaque foreign body. the wound was sutured with 4-0 Prolene simple interrupted sutures and a total of 14 sutures were applied. on August 31 she was seen in the ER and it was noted that the wound had dehisced and she was treated with Keflex. She has completed a course of doxycycline recently Medical history  also significant for status post abdominal hysterectomy, brain surgery, cardiac catheterization, EGD and bronchoscopy. of late she was back in the ER and was seen last on August 31 and they tried Silvadene 02/22/16: pt reports discomfort and mild pain just below her wound. however, she admits on legs more than usual. no redness, foul odor or purulence. the wound is measuring slightly larger today, but likely secondary to poor edema control. nursing staff gave information to pt today regarding purchase of her compression stockings. she was encouraged to purchase soon. 02/29/16: returns today for f/u. she denies systemic s/s of infection. she brought her new compression stockings today. 03/21/2016 -- She has been put on an antibiotic and prednisone for a bronchitis by her pulmonologist. 04/04/2016 -- she continues to be on prednisone. 04-18-2016 [name] is for evaluation of her left lateral leg ulcer. She states that she is still on a titrating dose of prednisone. She also states that she recently received a steroid injection to her left knee. She states that her blood sugars have elevated slightly with the max reading in the 130s. She denies any pain or discomfort to her left leg ulcer. She states that she has been unable to wear her  compression stockings and as a result has ankle increased edema to the left leg. [name], [name] ([id number]) 05/08/2016 - Ms. Lepp returns today for evaluation of her left lateral leg ulcer. She states that she has not been able to wear her compression hose due to her arthritis and pain in her hands. She denies any issues with her wound. 05/16/2016 -- venous duplex studies which I had ordered have not been done yet and I have asked her to get in touch with the office. 05/23/2016 -- venous duplex studies was done today and we will await the report. 05/30/2016 -- had a lower extremity venous duplex reflux evaluation which showed no evidence of DVT in the left femoral popliteal venous system. There was venous incompetence in the left saphenofemoral junction greater saphenous vein, common femoral vein and popliteal vein. Based on the above I would recommend a opinion from the vascular surgeons 06/27/2016 -- the patient was seen by Dr. Sherren Mocha early on 06/19/2016 and after review of her data and physical examination, he fell she did not have severe changes of venous hypertension and would get minimal if any benefit from ablation of a small segment of her great saphenous vein from mid calf to just above her knee. He had recommended knee-high compressions and elevation when possible. she returns today as she had noticed a small bruise in the area for wound and because of this she was concerned and came back for an opinion. 07/11/2016 -- she returns today with a couple of superficial abrasions and ulcerations possibly from tape burns from using bordered foam to protect the wound. Continues to have lymphedema. Objective Constitutional Pulse regular. Respirations normal and unlabored. Afebrile. Vitals Time Taken: 8:20 AM, Height: 64 in, Weight: 283 lbs, BMI: 48.6, Temperature: 97.7 F, Pulse: 91 bpm, Respiratory Rate: 16 breaths/min, Blood Pressure: 86/52 mmHg. Eyes Nonicteric. Reactive to  light. Ears, Nose, Mouth, and Throat Lips, teeth, and gums WNL.[name] Moist mucosa without lesions. Neck supple and nontender. No palpable supraclavicular or cervical adenopathy. Normal sized without goiter. Respiratory WNL. No retractions.. Breath sounds WNL, No rubs, rales, rhonchi, or wheeze.[name] [name], [name] ([id number]) Cardiovascular Heart rhythm and rate regular, no murmur or gallop.. Pedal Pulses WNL. No clubbing, cyanosis or edema. Chest Breasts symmetical and no nipple  discharge.. Breast tissue WNL, no masses, lumps, or tenderness.. Lymphatic No adneopathy. No adenopathy. No adenopathy. Musculoskeletal Adexa without tenderness or enlargement.. Digits and nails w/o clubbing, cyanosis, infection, petechiae, ischemia, or inflammatory conditions.[name] Psychiatric Judgement and insight Intact.. No evidence of depression, anxiety, or agitation.. General Notes: the 2 areas where she's had superficial breakdown of her skin is looking much better and that [place] very tiny area still open Integumentary (Hair, Skin) No suspicious lesions. No crepitus or fluctuance. No peri-wound warmth or erythema. No masses.. Wound #1 status is Open. Original cause of wound was Gradually Appeared. The wound is located on the Left,Lateral Lower Leg. The wound measures 0.2cm length x 0.2cm width x 0.1cm depth; 0.031cm^2 area and 0.003cm^3 volume. There is Fat Layer (Subcutaneous Tissue) Exposed exposed. There is no tunneling or undermining noted. There is a medium amount of serosanguineous drainage noted. The wound margin is flat and intact. There is large (67-100%) pink, pale granulation within the wound bed. There is no necrotic tissue within the wound bed. The periwound skin appearance exhibited: Hemosiderin Staining, Mottled. The periwound skin appearance did not exhibit: Callus, Crepitus, Excoriation, Induration, Rash, Scarring, Dry/Scaly, Maceration, Atrophie Blanche, Cyanosis, Ecchymosis, Pallor, Rubor,  Erythema. Periwound temperature was noted as No Abnormality. The periwound has tenderness on palpation. Assessment Active Problems ICD-10 E11.622 - Type 2 diabetes mellitus with other skin ulcer L97.222 - Non-pressure chronic ulcer of left calf with fat layer exposed I89.0 - Lymphedema, not elsewhere classified I83.022 - Varicose veins of left lower extremity with ulcer of calf [name], [name]. ([id number]) Plan Wound Cleansing: Wound #1 Left,Lateral Lower Leg: Clean wound with Normal Saline. Primary Wound Dressing: Wound #1 Left,Lateral Lower Leg: Prisma Ag Secondary Dressing: Wound #1 Left,Lateral Lower Leg: Gauze and Kerlix/Conform Boardered Foam Dressing Dressing Change Frequency: Wound #1 Left,Lateral Lower Leg: Change dressing every other day. Follow-up Appointments: Wound #1 Left,Lateral Lower Leg: Return Appointment in 1 week. Edema Control: Wound #1 Left,Lateral Lower Leg: Patient to wear own compression stockings Elevate legs to the level of the heart and pump ankles as often as possible Additional Orders / Instructions: Wound #1 Left,Lateral Lower Leg: Increase protein intake. Activity as tolerated Home Health: Wound #1 Left,Lateral Lower Leg: FACE TO FACE ENCOUNTER: MEDICARE and MEDICAID PATIENTS: I certify that this patient is under my care and that I had a face-to-face encounter that meets the physician face-to-face encounter requirements with this patient on this date. The encounter with the patient was in whole or in part for the following MEDICAL CONDITION: (primary reason for [place]) MEDICAL NECESSITY: I certify, that based on my findings, NURSING services are a medically necessary home health service. HOME BOUND STATUS: I certify that my clinical findings support that this patient is homebound (i.e., Due to illness or injury, pt requires aid of supportive devices such as crutches, cane, wheelchairs, walkers, the use of special transportation  or the assistance of another person to leave their place of residence. There is a normal inability to leave the home and doing so requires considerable and taxing effort. Other absences are for medical reasons / religious services and are infrequent or of short duration when for other reasons). D/C [place], [place] F. ([id number]) the patient continues to have significant lymphedema and despite of using her compression stockings. Review I have recommended: 1. Using proper compression with 30-40 mm dual layer stockings and if she is not able to use those juxta lites to be used. Fresh prescriptions for this have been given.  she is still not got her compression stockings delivered 2. Elevation and exercise has been discussed in great detail 3. dressing changes to be done with Prisma AG and Kerlix and no tape to be used on her skin. 4. see her back next week Electronic Signature(s) Signed: 07/17/2016 10:06:01 AM By: Christin Fudge MD, FACS Entered By: Christin Fudge on 07/17/2016 10:06:01 [name] ([id number]) -------------------------------------------------------------------------------- SuperBill Details Patient Name: [name]. Date of Service: 07/17/2016 Medical Record Number: [id number] Patient Account Number: [id number] Date of Birth/Sex: June 06, 1940 (76 y.o. 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 Facility Procedures CPT4 Code: [id number] Description: 99213 - WOUND CARE VISIT-LEV 3 EST  PT Modifier: Quantity: 1 Physician Procedures CPT4 Code: 9987215 Description: 87276 - WC PHYS LEVEL 3 - EST PT ICD-10 Description Diagnosis E11.622 Type 2 diabetes mellitus with other skin ulcer L97.222 Non-pressure chronic ulcer of left calf with fat I89.0 Lymphedema, not elsewhere classified I83.022 Varicose veins  of left lower extremity with ulcer Modifier: layer exposed of calf Quantity: 1 Electronic Signature(s) Signed: 07/17/2016 10:06:49 AM By: Christin Fudge MD, FACS Previous Signature: 07/17/2016 10:06:15 AM Version By: Christin Fudge MD, FACS Entered By: Christin Fudge on 07/17/2016 10:06:49

## 2016-07-18 NOTE — Progress Notes (Signed)
DASHAY, GIESLER ([id number]) Visit Report for 07/17/2016 Arrival Information Details Patient Name: [name], [name]. Date of Service: 07/17/2016 8:15 AM Medical Record Number: [id number] Patient Account Number: [id number] Date of Birth/Sex: 05-01-1941 (76 y.o. Female) Treating RN: Afful, RN, BSN, Velva Harman Primary Care Jerik Falletta: Glendale Chard Other Clinician: Referring Linnet Bottari: Glendale Chard Treating Zamarah Ullmer/Extender: Frann Rider in Treatment: 23 Visit Information History Since Last Visit All ordered tests and consults were completed: No Patient Arrived: Walker Any new allergies or adverse reactions: No Arrival Time: 08:15 Had a fall or experienced change in No Accompanied By: self activities of daily living that may affect Transfer Assistance: None risk of falls: Patient Identification Verified: Yes Signs or symptoms of abuse/neglect since last No Secondary Verification Process Completed: Yes visito Patient Requires Transmission-Based No Hospitalized since last visit: No Precautions: Has Dressing in Place as Prescribed: Yes Patient Has Alerts: Yes Has Compression in Place as Prescribed: Yes Patient Alerts: DM II Pain Present Now: Yes Electronic Signature(s) Signed: 07/17/2016 10:07:11 AM By: Regan Lemming BSN, RN Entered By: Regan Lemming on 07/17/2016 08:16:02 [name] ([id number]) -------------------------------------------------------------------------------- Clinic Level of Care Assessment Details Patient Name: [name]. Date of Service: 07/17/2016 8:15 AM Medical Record Number: [id number] Patient Account Number: [id number] Date of Birth/Sex: 1940-07-10 (76 y.o. Female) Treating RN: Afful, RN, BSN, Velva Harman Primary Care Artavia Jeanlouis: Glendale Chard Other Clinician: Referring Georges Victorio: Glendale Chard Treating Ansel Ferrall/Extender: Frann Rider in Treatment: 23 Clinic Level of Care Assessment Items TOOL 4 Quantity Score _0  - Use when only an EandM  is performed on FOLLOW-UP visit 0 ASSESSMENTS - Nursing Assessment / Reassessment X - Reassessment of Co-morbidities (includes updates in patient status) 1 10 X - Reassessment of Adherence to Treatment Plan 1 5 ASSESSMENTS - Wound and Skin Assessment / Reassessment X - Simple Wound Assessment / Reassessment - one wound 1 5 _1  - Complex Wound Assessment / Reassessment - multiple wounds 0 _2  - Dermatologic / Skin Assessment (not related to wound area) 0 ASSESSMENTS - Focused Assessment X - Circumferential Edema Measurements - multi extremities 1 5 _3  - Nutritional Assessment / Counseling / Intervention 0 X - Lower Extremity Assessment (monofilament, tuning fork, pulses) 1 5 _4  - Peripheral Arterial Disease Assessment (using hand held doppler) 0 ASSESSMENTS - Ostomy and/or Continence Assessment and Care _5  - Incontinence Assessment and Management 0 _6  - Ostomy Care Assessment and Management (repouching, etc.) 0 PROCESS - Coordination of Care X - Simple Patient / Family Education for ongoing care 1 15 _7  - Complex (extensive) Patient / Family Education for ongoing care 0 _8  - Staff obtains Programmer, systems, Records, Test Results / Process Orders 0 X - Staff telephones HHA, Nursing Homes / Clarify orders / etc 1 10 _9  - Routine Transfer to another Facility (non-emergent condition) 0 Goar, Eline F. ([id number]) _10  - Routine Hospital Admission (non-emergent condition) 0 _11  - New Admissions / Biomedical engineer / Ordering NPWT, Apligraf, etc. 0 _12  - Emergency Hospital Admission (emergent condition) 0 _13  - Simple Discharge Coordination 0 _14  - Complex (extensive) Discharge Coordination 0 PROCESS - Special Needs _15  - Pediatric / Minor Patient Management 0 _16  - Isolation Patient Management 0 _17  - Hearing / Language / Visual special needs 0 _18  - Assessment of Community assistance (transportation, D/C planning, etc.) 0 _19  - Additional assistance / Altered mentation 0 _20  - Support Surface(s)  Assessment (bed, cushion, seat, etc.) 0 INTERVENTIONS - Wound Cleansing / Measurement X - Simple Wound Cleansing - one wound 1 5 _21  - Complex  Wound Cleansing - multiple wounds 0 X - Wound Imaging (photographs - any number of wounds) 1 5 _0  - Wound Tracing (instead of photographs) 0 X - Simple Wound Measurement - one wound 1 5 _1  - Complex Wound Measurement - multiple wounds 0 INTERVENTIONS - Wound Dressings X - Small Wound Dressing one or multiple wounds 1 10 _2  - Medium Wound Dressing one or multiple wounds 0 _3  - Large Wound Dressing one or multiple wounds 0 <BJYNWGNFAOZHYQMV>_7<\/QIONGEXBMWUXLKGM>_0  - Application of Medications - topical 0 <NUUVOZDGUYQIHKVQ>_2<\/VZDGLOVFIEPPIRJJ>_8  - Application of Medications - injection 0 INTERVENTIONS - Miscellaneous _6  - External ear exam 0 ZARIELLE, CEA. ([id number]) _7  - Specimen Collection (cultures, biopsies, blood, body fluids, etc.) 0 _8  - Specimen(s) / Culture(s) sent or taken to Lab for analysis 0 _9  - Patient Transfer (multiple staff / Harrel Lemon Lift / Similar devices) 0 _10  - Simple Staple / Suture removal (25 or less) 0 _11  - Complex Staple / Suture removal (26 or more) 0 _12  - Hypo / Hyperglycemic Management (close monitor of Blood Glucose) 0 _13  - Ankle / Brachial Index (ABI) - do not check if billed separately 0 X - Vital Signs 1 5 Has the patient been seen at the hospital within the last three years: Yes Total Score: 85 Level Of Care: New/Established - Level 3 Electronic Signature(s) Signed: 07/17/2016 10:07:11 AM By: Regan Lemming BSN, RN Entered By: Regan Lemming on 07/17/2016 08:42:52 [name] ([id number]) -------------------------------------------------------------------------------- Encounter Discharge Information Details Patient Name: [name]. Date of Service: 07/17/2016 8:15 AM Medical Record Number: [id number] Patient Account Number: [id number] Date of Birth/Sex: 04-08-1941 (76 y.o. Female) Treating RN: Afful, RN, BSN, Velva Harman Primary Care Mikailah Morel: Glendale Chard Other  Clinician: Referring Karelly Dewalt: Glendale Chard Treating Cranford Blessinger/Extender: Frann Rider in Treatment: 23 Encounter Discharge Information Items Discharge Pain Level: 0 Discharge Condition: Stable Ambulatory Status: Walker Discharge Destination: Home Transportation: Private Auto Accompanied By: self Schedule Follow-up Appointment: No Medication Reconciliation completed No and provided to Patient/Care Lanny Donoso: Provided on Clinical Summary of Care: 07/17/2016 Form Type Recipient Paper Patient DG Electronic Signature(s) Signed: 07/17/2016 10:07:11 AM By: Regan Lemming BSN, RN Previous Signature: 07/17/2016 8:42:44 AM Version By: Ruthine Dose Entered By: Regan Lemming on 07/17/2016 08:44:11 [name] ([id number]) -------------------------------------------------------------------------------- Lower Extremity Assessment Details Patient Name: [name]. Date of Service: 07/17/2016 8:15 AM Medical Record Number: [id number] Patient Account Number: [id number] Date of Birth/Sex: 1941/04/14 (76 y.o. Female) Treating RN: Afful, RN, BSN, [place] Primary Care Francys Bolin: Glendale Chard Other Clinician: Referring Thijs Brunton: Glendale Chard Treating Jumar Greenstreet/Extender: Frann Rider in Treatment: 23 Edema Assessment Assessed: [Left: No] [Right: No] E[Left: dema] [Right: :] Calf Left: Right: Point of Measurement: 34 cm From Medial Instep 47.8 cm 47.6 cm Ankle Left: Right: Point of Measurement: 11 cm From Medial Instep 32.6 cm 32.4 cm Vascular Assessment Claudication: Claudication Assessment [Left:None] [Right:None] Pulses: Dorsalis Pedis Palpable: [Left:Yes] [Right:Yes] Posterior Tibial Extremity colors, hair growth, and conditions: Extremity Color: [Left:Mottled] [Right:Mottled] Hair Growth on Extremity: [Left:No] [Right:No] Temperature of Extremity: [Left:Warm] [Right:Warm] Capillary Refill: [Left:< 3 seconds] [Right:< 3 seconds] Toe Nail Assessment Left:  Right: Thick: Yes Yes Discolored: Yes Yes Deformed: No No Improper Length and Hygiene: No No Electronic Signature(s) Signed: 07/17/2016 10:07:11 AM By: Regan Lemming BSN, RN Entered By: Regan Lemming on 07/17/2016 08:28:59 [name] ([name], Salvadore Oxford ([id number]) -------------------------------------------------------------------------------- Multi Wound Chart Details Patient Name: [name]. Date of Service: 07/17/2016 8:15 AM Medical Record Number: [id number] Patient Account Number: [id number] Date of Birth/Sex: 05-02-1941 (  76 y.o. Female) Treating RN: Baruch Gouty, RN, BSN, Velva Harman Primary Care Grey Rakestraw: Glendale Chard Other Clinician: Referring Ancil Dewan: Glendale Chard Treating Jahmari Esbenshade/Extender: Frann Rider in Treatment: 23 Vital Signs Height(in): 64 Pulse(bpm): 91 Weight(lbs): 283 Blood Pressure 86/52 (mmHg): Body Mass Index(BMI): 49 Temperature(F): 97.7 Respiratory Rate 16 (breaths/min): Photos: [1:No Photos] [N/A:N/A] Wound Location: [1:Left, Lateral Lower Leg] [N/A:N/A] Wounding Event: [1:Gradually Appeared] [N/A:N/A] Primary Etiology: [1:Diabetic Wound/Ulcer of N/A the Lower Extremity] Comorbid History: [1:Cataracts, Lymphedema, N/A Hypertension, Type II Diabetes, Gout, Rheumatoid Arthritis, Osteoarthritis, Neuropathy] Date Acquired: [1:06/27/2016] [N/A:N/A] Weeks of Treatment: [1:0] [N/A:N/A] Wound Status: [1:Open] [N/A:N/A] Measurements L x W x D 0.2x0.2x0.1 [N/A:N/A] (cm) Area (cm) : [1:0.031] [N/A:N/A] Volume (cm) : [1:0.003] [N/A:N/A] % Reduction in Area: [1:99.50%] [N/A:N/A] % Reduction in Volume: 99.50% [N/A:N/A] Classification: [1:Grade 1] [N/A:N/A] Exudate Amount: [1:Medium] [N/A:N/A] Exudate Type: [1:Serosanguineous] [N/A:N/A] Exudate Color: [1:red, brown] [N/A:N/A] Wound Margin: [1:Flat and Intact] [N/A:N/A] Granulation Amount: [1:Large (67-100%)] [N/A:N/A] Granulation Quality: [1:Pink, Pale] [N/A:N/A] Necrotic Amount:  [1:None Present (0%)] [N/A:N/A] Exposed Structures: [1:Fat Layer (Subcutaneous N/A Tissue) Exposed: Yes Fascia: No] Tendon: No Muscle: No Joint: No Bone: No Epithelialization: None N/A N/A Periwound Skin Texture: Excoriation: No N/A N/A Induration: No Callus: No Crepitus: No Rash: No Scarring: No Periwound Skin Maceration: No N/A N/A Moisture: Dry/Scaly: No Periwound Skin Color: Hemosiderin Staining: Yes N/A N/A Mottled: Yes Atrophie Blanche: No Cyanosis: No Ecchymosis: No Erythema: No Pallor: No Rubor: No Temperature: No Abnormality N/A N/A Tenderness on Yes N/A N/A Palpation: Wound Preparation: Ulcer Cleansing: N/A N/A Rinsed/Irrigated with Saline Topical Anesthetic Applied: Other: lidocaine 4% Treatment Notes Wound #1 (Left, Lateral Lower Leg) 1. Cleansed with: Clean wound with Normal Saline 5. Secondary Dressing Applied Bordered Foam Dressing Dry Gauze Kerlix/Conform Notes added coban for securing Electronic Signature(s) Signed: 07/17/2016 10:02:53 AM By: Christin Fudge MD, FACS Entered By: Christin Fudge on 07/17/2016 10:02:53 DELESIA, MARTINEK ([id number]) [name] ([id number]) -------------------------------------------------------------------------------- Multi-Disciplinary Care Plan Details Patient Name: TEIRRA, CARAPIA. Date of Service: 07/17/2016 8:15 AM Medical Record Number: [id number] Patient Account Number: [id number] Date of Birth/Sex: 03-21-1941 (76 y.o. Female) Treating RN: Afful, RN, BSN, Velva Harman Primary Care Allysa Governale: Glendale Chard Other Clinician: Referring Dorr Perrot: Glendale Chard Treating Allanna Bresee/Extender: Frann Rider in Treatment: 28 Active Inactive ` Abuse / Safety / Falls / Self Care Management Nursing Diagnoses: Impaired physical mobility Potential for falls Goals: Patient will remain injury free Date Initiated: 07/11/2016 Target Resolution Date: 10/08/2016 Goal Status: Active Interventions: Assess fall  risk on admission and as needed Notes: ` Orientation to the Wound Care Program Nursing Diagnoses: Knowledge deficit related to the wound healing center program Goals: Patient/caregiver will verbalize understanding of the [place] Date Initiated: 07/11/2016 Target Resolution Date: 10/08/2016 Goal Status: Active Interventions: Provide education on orientation to the wound center Notes: ` Venous Leg Ulcer Nursing Diagnoses: Knowledge deficit related to disease process and management SAHMYA, ARAI ([id number]) Potential for venous Insuffiency (use before diagnosis confirmed) Goals: Patient will maintain optimal edema control Date Initiated: 07/11/2016 Target Resolution Date: 10/08/2016 Goal Status: Active Patient/caregiver will verbalize understanding of disease process and disease management Date Initiated: 07/11/2016 Target Resolution Date: 10/08/2016 Goal Status: Active Verify adequate tissue perfusion prior to therapeutic compression application Date Initiated: 07/11/2016 Target Resolution Date: 10/08/2016 Goal Status: Active Interventions: Assess peripheral edema status every visit. Compression as ordered Provide education on venous insufficiency Treatment Activities: Therapeutic compression applied : 07/11/2016 Notes: ` Wound/Skin Impairment Nursing Diagnoses: Impaired tissue integrity Goals: Patient/caregiver will verbalize understanding of skin  care regimen Date Initiated: 07/11/2016 Target Resolution Date: 10/08/2016 Goal Status: Active Ulcer/skin breakdown will have a volume reduction of 30% by week 4 Date Initiated: 07/11/2016 Target Resolution Date: 10/08/2016 Goal Status: Active Ulcer/skin breakdown will have a volume reduction of 50% by week 8 Date Initiated: 07/11/2016 Target Resolution Date: 10/08/2016 Goal Status: Active Ulcer/skin breakdown will have a volume reduction of 80% by week 12 Date Initiated: 07/11/2016 Target Resolution  Date: 10/08/2016 Goal Status: Active Ulcer/skin breakdown will heal within 14 weeks Date Initiated: 07/11/2016 Target Resolution Date: 10/08/2016 Goal Status: Active DARSHAY, DEUPREE ([id number]) Interventions: Assess patient/caregiver ability to obtain necessary supplies Assess patient/caregiver ability to perform ulcer/skin care regimen upon admission and as needed Assess ulceration(s) every visit Notes: Electronic Signature(s) Signed: 07/17/2016 10:07:11 AM By: Regan Lemming BSN, RN Entered By: Regan Lemming on 07/17/2016 08:30:05 [name] ([id number]) -------------------------------------------------------------------------------- Pain Assessment Details Patient Name: [name]. Date of Service: 07/17/2016 8:15 AM Medical Record Number: [id number] Patient Account Number: [id number] Date of Birth/Sex: 1940/07/06 (76 y.o. Female) Treating RN: Afful, RN, BSN, Velva Harman Primary Care Zakariya Knickerbocker: Glendale Chard Other Clinician: Referring Anayeli Arel: Glendale Chard Treating Vondra Aldredge/Extender: Frann Rider in Treatment: 23 Active Problems Location of Pain Severity and Description of Pain Patient Has Paino No Site Locations With Dressing Change: No Pain Management and Medication Current Pain Management: Electronic Signature(s) Signed: 07/17/2016 10:07:11 AM By: Regan Lemming BSN, RN Entered By: Regan Lemming on 07/17/2016 08:20:16 [name] ([id number]) -------------------------------------------------------------------------------- Patient/Caregiver Education Details Patient Name: [name]. Date of Service: 07/17/2016 8:15 AM Medical Record Number: [id number] Patient Account Number: [id number] Date of Birth/Gender: 1940-09-24 (76 y.o. Female) Treating RN: Baruch Gouty, RN, BSN, Velva Harman Primary Care Physician: Glendale Chard Other Clinician: Referring Physician: Glendale Chard Treating Physician/Extender: Frann Rider in Treatment: 62 Education  Assessment Education Provided To: Patient Education Topics Provided Basic Hygiene: Methods: Explain/Verbal Responses: State content correctly Venous: Methods: Explain/Verbal Responses: State content correctly Welcome To [facility]: Methods: Explain/Verbal Responses: State content correctly Wound/Skin Impairment: Methods: Explain/Verbal Responses: State content correctly Electronic Signature(s) Signed: 07/17/2016 10:07:11 AM By: Regan Lemming BSN, RN Entered By: Regan Lemming on 07/17/2016 08:44:31 [name] ([id number]) -------------------------------------------------------------------------------- Wound Assessment Details Patient Name: [name]. Date of Service: 07/17/2016 8:15 AM Medical Record Number: [id number] Patient Account Number: [id number] Date of Birth/Sex: [birth year] (76 y.o. Female) Treating RN: Afful, RN, BSN, [place] Primary Care Cheyane Ayon: Glendale Chard Other Clinician: Referring Tallulah Hosman: Glendale Chard Treating Marleta Lapierre/Extender: Frann Rider in Treatment: 23 Wound Status Wound Number: 1 Primary Diabetic Wound/Ulcer of the Lower Etiology: Extremity Wound Location: Left, Lateral Lower Leg Wound Open Wounding Event: Gradually Appeared Status: Date Acquired: 06/27/2016 Comorbid Cataracts, Lymphedema, Hypertension, Weeks Of Treatment: 0 History: Type II Diabetes, Gout, Rheumatoid Clustered Wound: No Arthritis, Osteoarthritis, Neuropathy Photos Photo Uploaded By: Regan Lemming on 07/17/2016 16:32:39 Wound Measurements Length: (cm) 0.2 Width: (cm) 0.2 Depth: (cm) 0.1 Area: (cm) 0.031 Volume: (cm) 0.003 % Reduction in Area: 99.5% % Reduction in Volume: 99.5% Epithelialization: None Tunneling: No Undermining: No Wound Description Classification: Grade 1 Foul Odor Aft Wound Margin: Flat and Intact Slough/Fibrin Exudate Amount: Medium Exudate Type: MACIL, CRADY ([id number]) er Cleansing: No o  No Exudate Color: red, brown Wound Bed Granulation Amount: Large (67-100%) Exposed Structure Granulation Quality: Pink, Pale Fascia Exposed: No Necrotic Amount: None Present (0%) Fat Layer (Subcutaneous Tissue) Exposed: Yes Tendon Exposed: No Muscle Exposed: No Joint Exposed: No Bone Exposed: No Periwound Skin Texture Texture Color No Abnormalities Noted:  No No Abnormalities Noted: No Callus: No Atrophie Blanche: No Crepitus: No Cyanosis: No Excoriation: No Ecchymosis: No Induration: No Erythema: No Rash: No Hemosiderin Staining: Yes Scarring: No Mottled: Yes Pallor: No Moisture Rubor: No No Abnormalities Noted: No Dry / Scaly: No Temperature / Pain Maceration: No Temperature: No Abnormality Tenderness on Palpation: Yes Wound Preparation Ulcer Cleansing: Rinsed/Irrigated with Saline Topical Anesthetic Applied: Other: lidocaine 4%, Treatment Notes Wound #1 (Left, Lateral Lower Leg) 1. Cleansed with: Clean wound with Normal Saline 5. Secondary Dressing Applied Bordered Foam Dressing Dry Gauze Kerlix/Conform Notes added coban for securing Electronic Signature(s) Signed: 07/17/2016 10:07:11 AM By: Regan Lemming BSN, RN Entered By: Regan Lemming on 07/17/2016 08:29:56 [name] ([id number]) -------------------------------------------------------------------------------- Vitals Details Patient Name: [name]. Date of Service: 07/17/2016 8:15 AM Medical Record Number: [id number] Patient Account Number: [id number] Date of Birth/Sex: 03-16-1941 (76 y.o. Female) Treating RN: Afful, RN, BSN, [place] Primary Care Addy Mcmannis: Glendale Chard Other Clinician: Referring Harrel Ferrone: Glendale Chard Treating Freeland Pracht/Extender: Frann Rider in Treatment: 23 Vital Signs Time Taken: 08:20 Temperature (F): 97.7 Height (in): 64 Pulse (bpm): 91 Weight (lbs): 283 Respiratory Rate (breaths/min): 16 Body Mass Index (BMI): 48.6 Blood Pressure (mmHg):  86/52 Reference Range: 80 - 120 mg / dl Electronic Signature(s) Signed: 07/17/2016 10:07:11 AM By: Regan Lemming BSN, RN Entered By: Regan Lemming on 07/17/2016 08:23:10

## 2016-07-22 DIAGNOSIS — E039 Hypothyroidism, unspecified: Secondary | ICD-10-CM | POA: Diagnosis not present

## 2016-07-22 DIAGNOSIS — M069 Rheumatoid arthritis, unspecified: Secondary | ICD-10-CM | POA: Diagnosis not present

## 2016-07-22 DIAGNOSIS — E785 Hyperlipidemia, unspecified: Secondary | ICD-10-CM | POA: Diagnosis not present

## 2016-07-22 DIAGNOSIS — N183 Chronic kidney disease, stage 3 (moderate): Secondary | ICD-10-CM | POA: Diagnosis not present

## 2016-07-22 DIAGNOSIS — E1129 Type 2 diabetes mellitus with other diabetic kidney complication: Secondary | ICD-10-CM | POA: Diagnosis not present

## 2016-07-22 DIAGNOSIS — D329 Benign neoplasm of meninges, unspecified: Secondary | ICD-10-CM | POA: Diagnosis not present

## 2016-07-22 DIAGNOSIS — I129 Hypertensive chronic kidney disease with stage 1 through stage 4 chronic kidney disease, or unspecified chronic kidney disease: Secondary | ICD-10-CM | POA: Diagnosis not present

## 2016-07-22 DIAGNOSIS — N2581 Secondary hyperparathyroidism of renal origin: Secondary | ICD-10-CM | POA: Diagnosis not present

## 2016-07-24 ENCOUNTER — Encounter: Payer: Medicare Other | Admitting: Surgery

## 2016-07-24 DIAGNOSIS — E1122 Type 2 diabetes mellitus with diabetic chronic kidney disease: Secondary | ICD-10-CM | POA: Diagnosis not present

## 2016-07-24 DIAGNOSIS — I83022 Varicose veins of left lower extremity with ulcer of calf: Secondary | ICD-10-CM | POA: Diagnosis not present

## 2016-07-24 DIAGNOSIS — L97222 Non-pressure chronic ulcer of left calf with fat layer exposed: Secondary | ICD-10-CM | POA: Diagnosis not present

## 2016-07-24 DIAGNOSIS — E11622 Type 2 diabetes mellitus with other skin ulcer: Secondary | ICD-10-CM | POA: Diagnosis not present

## 2016-07-24 DIAGNOSIS — I890 Lymphedema, not elsewhere classified: Secondary | ICD-10-CM | POA: Diagnosis not present

## 2016-07-24 DIAGNOSIS — N189 Chronic kidney disease, unspecified: Secondary | ICD-10-CM | POA: Diagnosis not present

## 2016-07-24 DIAGNOSIS — L97229 Non-pressure chronic ulcer of left calf with unspecified severity: Secondary | ICD-10-CM | POA: Diagnosis not present

## 2016-07-25 NOTE — Progress Notes (Signed)
JACQUALIN, SHIRKEY ([id number]) Visit Report for 07/24/2016 Chief Complaint Document Details Patient Name: [name], [name]. Date of Service: 07/24/2016 10:30 AM Medical Record Number: [id number] Patient Account Number: [id number] Date of Birth/Sex: 24-Oct-1940 (76 y.o. Female) Treating RN: Montey Hora Primary Care Provider: Glendale Chard Other Clinician: Referring Provider: Glendale Chard Treating Provider/Extender: Frann Rider in Treatment: 24 Information Obtained from: Patient Chief Complaint [name] presents for evaluation of her left leg ulcer Electronic Signature(s) Signed: 07/24/2016 11:04:14 AM By: Christin Fudge MD, FACS Entered By: Christin Fudge on 07/24/2016 11:04:14 [name] ([id number]) -------------------------------------------------------------------------------- HPI Details Patient Name: [name]. Date of Service: 07/24/2016 10:30 AM Medical Record Number: [id number] Patient Account Number: [id number] Date of Birth/Sex: 19-Sep-1940 (76 y.o. Female) Treating RN: Montey Hora Primary Care Provider: Glendale Chard Other Clinician: Referring Provider: Glendale Chard Treating Provider/Extender: Frann Rider in Treatment: 24 History of Present Illness Location: left lateral lower extremity Quality: denies pain Severity: denies pain Duration: ulcer has been present for several months Timing: denies pain Context: traumatic injury HPI Description: 76 year old patient with a past medical history of diabetes mellitus, chronic kidney disease, COPD, hypertension, rheumatoid arthritis had a lacerated wound to her left calf when she injured it on the corner of a car door and it was a long 12 cm laceration. x-ray done at the time revealed that there was no fracture or radiopaque foreign body. the wound was sutured with 4-0 Prolene simple interrupted sutures and a total of 14 sutures were applied. on August 31 she was seen in the ER and it  was noted that the wound had dehisced and she was treated with Keflex. She has completed a course of doxycycline recently Medical history also significant for status post abdominal hysterectomy, brain surgery, cardiac catheterization, EGD and bronchoscopy. of late she was back in the ER and was seen last on August 31 and they tried Silvadene 02/22/16: pt reports discomfort and mild pain just below her wound. however, she admits on legs more than usual. no redness, foul odor or purulence. the wound is measuring slightly larger today, but likely secondary to poor edema control. nursing staff gave information to pt today regarding purchase of her compression stockings. she was encouraged to purchase soon. 02/29/16: returns today for f/u. she denies systemic s/s of infection. she brought her new compression stockings today. 03/21/2016 -- She has been put on an antibiotic and prednisone for a bronchitis by her pulmonologist. 04/04/2016 -- she continues to be on prednisone. 04-18-2016 [name] is for evaluation of her left lateral leg ulcer. She states that she is still on a titrating dose of prednisone. She also states that she recently received a steroid injection to her left knee. She states that her blood sugars have elevated slightly with the max reading in the 130s. She denies any pain or discomfort to her left leg ulcer. She states that she has been unable to wear her compression stockings and as a result has ankle increased edema to the left leg. 05/08/2016 - [name] returns today for evaluation of her left lateral leg ulcer. She states that she has not been able to wear her compression hose due to her arthritis and pain in her hands. She denies any issues with her wound. 05/16/2016 -- venous duplex studies which I had ordered have not been done yet and I have asked her to get in touch with the office. 05/23/2016 -- venous duplex studies was done today and we will await the  report. SHAQUELA, WEICHERT ([id number]) 05/30/2016 -- had a lower extremity venous duplex reflux evaluation which showed no evidence of DVT in the left femoral popliteal venous system. There was venous incompetence in the left saphenofemoral junction greater saphenous vein, common femoral vein and popliteal vein. Based on the above I would recommend a opinion from the vascular surgeons 06/27/2016 -- the patient was seen by Dr. Sherren Mocha early on 06/19/2016 and after review of her data and physical examination, he fell she did not have severe changes of venous hypertension and would get minimal if any benefit from ablation of a small segment of her great saphenous vein from mid calf to just above her knee. He had recommended knee-high compressions and elevation when possible. she returns today as she had noticed a small bruise in the area for wound and because of this she was concerned and came back for an opinion. 07/11/2016 -- she returns today with a couple of superficial abrasions and ulcerations possibly from tape burns from using bordered foam to protect the wound. Continues to have lymphedema. Electronic Signature(s) Signed: 07/24/2016 11:04:22 AM By: Christin Fudge MD, FACS Entered By: Christin Fudge on 07/24/2016 11:04:21 [name] ([id number]) -------------------------------------------------------------------------------- Physical Exam Details Patient Name: [name]. Date of Service: 07/24/2016 10:30 AM Medical Record Number: [id number] Patient Account Number: [id number] Date of Birth/Sex: [birth year] (76 y.o. Female) Treating RN: Montey Hora Primary Care Provider: Glendale Chard Other Clinician: Referring Provider: Glendale Chard Treating Provider/Extender: Frann Rider in Treatment: 24 Constitutional . Pulse regular. Respirations normal and unlabored. Afebrile. . Eyes Nonicteric. Reactive to light. Ears, Nose, Mouth, and Throat Lips, teeth, and gums WNL.[name]  Moist mucosa without lesions. Neck supple and nontender. No palpable supraclavicular or cervical adenopathy. Normal sized without goiter. Respiratory WNL. No retractions.. Breath sounds WNL, No rubs, rales, rhonchi, or wheeze.. Cardiovascular Heart rhythm and rate regular, no murmur or gallop.. Pedal Pulses WNL. No clubbing, cyanosis or edema. Chest Breasts symmetical and no nipple discharge.. Breast tissue WNL, no masses, lumps, or tenderness.. Lymphatic No adneopathy. No adenopathy. No adenopathy. Musculoskeletal Adexa without tenderness or enlargement.. Digits and nails w/o clubbing, cyanosis, infection, petechiae, ischemia, or inflammatory conditions.. Integumentary (Hair, Skin) No suspicious lesions. No crepitus or fluctuance. No peri-wound warmth or erythema. No masses.[name] Psychiatric Judgement and insight Intact.. No evidence of depression, anxiety, or agitation.. Notes lymphedema is well controlled and the wound on her left lateral calf has healed Electronic Signature(s) Signed: 07/24/2016 11:04:40 AM By: Christin Fudge MD, FACS Entered By: Christin Fudge on 07/24/2016 11:04:39 [name] ([id number]) -------------------------------------------------------------------------------- Physician Orders Details Patient Name: [name]. Date of Service: 07/24/2016 10:30 AM Medical Record Number: [id number] Patient Account Number: [id number] Date of Birth/Sex: Apr 08, 1941 (75 y.o. Female) Treating RN: Montey Hora Primary Care Provider: Glendale Chard Other Clinician: Referring Provider: Glendale Chard Treating Provider/Extender: Frann Rider in Treatment: 27 Verbal / Phone Orders: No Diagnosis Coding Discharge From [facility] Services o Discharge from [place] wearing compression daily from morning until bedtime Electronic Signature(s) Signed: 07/24/2016 4:19:30 PM By: Christin Fudge MD, FACS Signed: 07/24/2016 5:22:00 PM By: Montey Hora Entered By: Montey Hora on 07/24/2016 10:50:02 [name] ([id number]) -------------------------------------------------------------------------------- Problem List Details Patient Name: [name]. Date of Service: 07/24/2016 10:30 AM Medical Record Number: [id number] Patient Account Number: [id number] Date of Birth/Sex: 06-27-1940 (75 y.o. 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 Date of Service: 07/24/2016 10:30 AM Medical Record Number: [id number] Patient Account Number: [id number] Date of Birth/Sex: 11-30-1940 (75 y.o. Female) Treating RN: Montey Hora Primary Care Provider: Glendale Chard Other Clinician: Referring Provider: Glendale Chard Treating Provider/Extender: Frann Rider in Treatment: 24 Subjective Chief Complaint Information obtained from Patient Ms. Bitton presents for evaluation of her left leg ulcer History of Present Illness (HPI) The following HPI elements were documented for the patient's wound: Location: left lateral lower extremity Quality: denies pain Severity: denies pain Duration:  ulcer has been present for several months Timing: denies pain Context: traumatic injury 76 year old patient with a past medical history of diabetes mellitus, chronic kidney disease, COPD, hypertension, rheumatoid arthritis had a lacerated wound to her left calf when she injured it on the corner of a car door and it was a long 12 cm laceration. x-ray done at the time revealed that there was no fracture or radiopaque foreign body. the wound was sutured with 4-0 Prolene simple interrupted sutures and a total of 14 sutures were applied. on August 31 she was seen in the ER and it was noted that the wound had dehisced and she was treated with Keflex. She has completed a course of doxycycline recently Medical history also significant for status post abdominal hysterectomy, brain surgery, cardiac catheterization, EGD and bronchoscopy. of late she was back in the ER and was seen last on August 31 and they tried Silvadene 02/22/16: pt reports discomfort and mild pain just below her wound. however, she admits on legs more than usual. no redness, foul odor or purulence. the wound is measuring slightly larger today, but likely secondary to poor edema control. nursing staff gave information to pt today regarding purchase of her compression stockings. she was encouraged to purchase soon. 02/29/16: returns today for f/u. she denies systemic s/s of infection. she brought her new compression stockings today. 03/21/2016 -- She has been put on an antibiotic and prednisone for a bronchitis by her pulmonologist. 04/04/2016 -- she continues to be on prednisone. 04-18-2016 Ms. Dorman is for evaluation of her left lateral leg ulcer. She states that she is still on a titrating dose of prednisone. She also states that she recently received a steroid injection to her left knee. She states that her blood sugars have elevated slightly with the max reading in the 130s. She denies any pain or discomfort to her left leg  ulcer. She states that she has been unable to wear her compression stockings and as a result has ankle increased edema to the left leg. TALYIA, ALLENDE ([id number]) 05/08/2016 - Ms. Renegar returns today for evaluation of her left lateral leg ulcer. She states that she has not been able to wear her compression hose due to her arthritis and pain in her hands. She denies any issues with her wound. 05/16/2016 -- venous duplex studies which I had ordered have not been done yet and I have asked her to get in touch with the office. 05/23/2016 --  venous duplex studies was done today and we will await the report. 05/30/2016 -- had a lower extremity venous duplex reflux evaluation which showed no evidence of DVT in the left femoral popliteal venous system. There was venous incompetence in the left saphenofemoral junction greater saphenous vein, common femoral vein and popliteal vein. Based on the above I would recommend a opinion from the vascular surgeons 06/27/2016 -- the patient was seen by Dr. Sherren Mocha early on 06/19/2016 and after review of her data and physical examination, he fell she did not have severe changes of venous hypertension and would get minimal if any benefit from ablation of a small segment of her great saphenous vein from mid calf to just above her knee. He had recommended knee-high compressions and elevation when possible. she returns today as she had noticed a small bruise in the area for wound and because of this she was concerned and came back for an opinion. 07/11/2016 -- she returns today with a couple of superficial abrasions and ulcerations possibly from tape burns from using bordered foam to protect the wound. Continues to have lymphedema. Objective Constitutional Pulse regular. Respirations normal and unlabored. Afebrile. Vitals Time Taken: 10:39 AM, Height: 64 in, Weight: 283 lbs, BMI: 48.6, Temperature: 97.8 F, Pulse: 102 bpm, Respiratory Rate: 18 breaths/min, Blood  Pressure: 85/54 mmHg. Eyes Nonicteric. Reactive to light. Ears, Nose, Mouth, and Throat Lips, teeth, and gums WNL.[name] Moist mucosa without lesions. Neck supple and nontender. No palpable supraclavicular or cervical adenopathy. Normal sized without goiter. Respiratory WNL. No retractions.. Breath sounds WNL, No rubs, rales, rhonchi, or wheeze.[name] JOVANKA, WESTGATE ([id number]) Cardiovascular Heart rhythm and rate regular, no murmur or gallop.. Pedal Pulses WNL. No clubbing, cyanosis or edema. Chest Breasts symmetical and no nipple discharge.. Breast tissue WNL, no masses, lumps, or tenderness.. Lymphatic No adneopathy. No adenopathy. No adenopathy. Musculoskeletal Adexa without tenderness or enlargement.. Digits and nails w/o clubbing, cyanosis, infection, petechiae, ischemia, or inflammatory conditions.[name] Psychiatric Judgement and insight Intact.. No evidence of depression, anxiety, or agitation.. General Notes: lymphedema is well controlled and the wound on her left lateral calf has healed Integumentary (Hair, Skin) No suspicious lesions. No crepitus or fluctuance. No peri-wound warmth or erythema. No masses.. Wound #1 status is Open. Original cause of wound was Gradually Appeared. The wound is located on the Left,Lateral Lower Leg. The wound measures 0cm length x 0cm width x 0cm depth; 0cm^2 area and 0cm^3 volume. 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 ([id number]) the wound is completely healed and I have recommended: 1. Using proper compression with 30-40 mm dual layer stockings and if she is not  able to use those juxta lites to be used. 2. Elevation and exercise has been discussed in great detail 3. she is discharge from the wound care services and be seen back only if needed Electronic Signature(s) Signed: 07/24/2016 11:06:02 AM By: Christin Fudge MD, FACS Entered By: Christin Fudge on 07/24/2016 11:06:02 [name] ([id number]) -------------------------------------------------------------------------------- SuperBill Details Patient Name: [name]. Date of Service: 07/24/2016 Medical Record Number: [id number] Patient Account Number: [id number] Date of Birth/Sex: Oct 11, 1940 (76 y.o. 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 Facility Procedures CPT4 Code: [id number] Description: [phone] - WOUND CARE VISIT-LEV 2 EST PT Modifier: Quantity: 1 Physician Procedures CPT4 Code: [id number] Description: 97282 - WC PHYS LEVEL 2 - EST PT ICD-10 Description Diagnosis E11.622 Type 2 diabetes mellitus with other skin ulcer L97.222 Non-pressure chronic ulcer of left calf with fat I89.0 Lymphedema, not elsewhere classified I83.022 Varicose veins  of left lower extremity with ulcer Modifier: layer exposed of calf Quantity: 1 Electronic Signature(s) Signed: 07/24/2016 2:18:58 PM By: Montey Hora Signed: 07/24/2016 4:19:30 PM By: Christin Fudge MD, FACS Previous Signature: 07/24/2016 11:06:15 AM Version By: Christin Fudge MD, FACS Entered By: Montey Hora on 07/24/2016 14:18:58

## 2016-07-25 NOTE — Progress Notes (Signed)
ABBIGAILE, ROCKMAN ([id number]) Visit Report for 07/24/2016 Arrival Information Details Patient Name: [name], [name]. Date of Service: 07/24/2016 10:30 AM Medical Record Number: [id number] Patient Account Number: [id number] Date of Birth/Sex: February 26, 1941 (75 y.o. Female) Treating RN: Montey Hora Primary Care Valissa Lyvers: Glendale Chard Other Clinician: Referring Madelyne Millikan: Glendale Chard Treating Zana Biancardi/Extender: Frann Rider in Treatment: 24 Visit Information History Since Last Visit Added or deleted any medications: No Patient Arrived: Walker Any new allergies or adverse reactions: No Arrival Time: 10:38 Had a fall or experienced change in No Accompanied By: self activities of daily living that may affect Transfer Assistance: None risk of falls: Patient Identification Verified: Yes Signs or symptoms of abuse/neglect since last No Secondary Verification Process Completed: Yes visito Patient Requires Transmission-Based No Hospitalized since last visit: No Precautions: Has Dressing in Place as Prescribed: Yes Patient Has Alerts: Yes Has Compression in Place as Prescribed: Yes Pain Present Now: No Electronic Signature(s) Signed: 07/24/2016 5:22:00 PM By: Montey Hora Entered By: Montey Hora on 07/24/2016 10:39:15 [name] ([id number]) -------------------------------------------------------------------------------- Clinic Level of Care Assessment Details Patient Name: [name]. Date of Service: 07/24/2016 10:30 AM Medical Record Number: [id number] Patient Account Number: [id number] Date of Birth/Sex: 01-08-1941 (75 y.o. Female) Treating RN: Montey Hora Primary Care Veryl Winemiller: Glendale Chard Other Clinician: Referring Erva Koke: Glendale Chard Treating Connar Keating/Extender: Frann Rider in Treatment: 24 Clinic Level of Care Assessment Items TOOL 4 Quantity Score _0  - Use when only an EandM is performed on FOLLOW-UP visit 0 ASSESSMENTS -  Nursing Assessment / Reassessment X - Reassessment of Co-morbidities (includes updates in patient status) 1 10 X - Reassessment of Adherence to Treatment Plan 1 5 ASSESSMENTS - Wound and Skin Assessment / Reassessment X - Simple Wound Assessment / Reassessment - one wound 1 5 _1  - Complex Wound Assessment / Reassessment - multiple wounds 0 _2  - Dermatologic / Skin Assessment (not related to wound area) 0 ASSESSMENTS - Focused Assessment _3  - Circumferential Edema Measurements - multi extremities 0 _4  - Nutritional Assessment / Counseling / Intervention 0 X - Lower Extremity Assessment (monofilament, tuning fork, pulses) 1 5 _5  - Peripheral Arterial Disease Assessment (using hand held doppler) 0 ASSESSMENTS - Ostomy and/or Continence Assessment and Care _6  - Incontinence Assessment and Management 0 _7  - Ostomy Care Assessment and Management (repouching, etc.) 0 PROCESS - Coordination of Care X - Simple Patient / Family Education for ongoing care 1 15 _8  - Complex (extensive) Patient / Family Education for ongoing care 0 _9  - Staff obtains Programmer, systems, Records, Test Results / Process Orders 0 _10  - Staff telephones HHA, Nursing Homes / Clarify orders / etc 0 _11  - Routine Transfer to another Facility (non-emergent condition) 0 [name], RODY. ([id number]) _12  - Routine Hospital Admission (non-emergent condition) 0 _13  - New Admissions / Biomedical engineer / Ordering NPWT, Apligraf, etc. 0 _14  - Emergency Hospital Admission (emergent condition) 0 X - Simple Discharge Coordination 1 10 _15  - Complex (extensive) Discharge Coordination 0 PROCESS - Special Needs _16  - Pediatric / Minor Patient Management 0 _17  - Isolation Patient Management 0 _18  - Hearing / Language / Visual special needs 0 _19  - Assessment of Community assistance (transportation, D/C planning, etc.) 0 _20  - Additional assistance / Altered mentation 0 _21  - Support Surface(s) Assessment (bed, cushion, seat, etc.)  0 INTERVENTIONS - Wound Cleansing / Measurement X - Simple Wound Cleansing - one wound 1 5 _22  - Complex Wound Cleansing - multiple wounds 0 X - Wound Imaging (photographs - any  number of wounds) 1 5 _0  - Wound Tracing (instead of photographs) 0 X - Simple Wound Measurement - one wound 1 5 _1  - Complex Wound Measurement - multiple wounds 0 INTERVENTIONS - Wound Dressings _2  - Small Wound Dressing one or multiple wounds 0 _3  - Medium Wound Dressing one or multiple wounds 0 _4  - Large Wound Dressing one or multiple wounds 0 <IWLNLGXQJJHERDEY>_8<\/XKGYJEHUDJSHFWYO>_3  - Application of Medications - topical 0 <ZCHYIFOYDXAJOINO>_6<\/VEHMCNOBSJGGEZMO>_2  - Application of Medications - injection 0 INTERVENTIONS - Miscellaneous _7  - External ear exam 0 [name], MCVEY. ([id number]) _8  - Specimen Collection (cultures, biopsies, blood, body fluids, etc.) 0 _9  - Specimen(s) / Culture(s) sent or taken to Lab for analysis 0 _10  - Patient Transfer (multiple staff / Harrel Lemon Lift / Similar devices) 0 _11  - Simple Staple / Suture removal (25 or less) 0 _12  - Complex Staple / Suture removal (26 or more) 0 _13  - Hypo / Hyperglycemic Management (close monitor of Blood Glucose) 0 _14  - Ankle / Brachial Index (ABI) - do not check if billed separately 0 X - Vital Signs 1 5 Has the patient been seen at the hospital within the last three years: Yes Total Score: 70 Level Of Care: New/Established - Level 2 Electronic Signature(s) Signed: 07/24/2016 5:22:00 PM By: Montey Hora Entered By: Montey Hora on 07/24/2016 14:18:48 [name] ([id number]) -------------------------------------------------------------------------------- Encounter Discharge Information Details Patient Name: [name]. Date of Service: 07/24/2016 10:30 AM Medical Record Number: [id number] Patient Account Number: [id number] Date of Birth/Sex: [birth year] (75 y.o. Female) Treating RN: Montey Hora Primary Care Zubin Pontillo: Glendale Chard Other Clinician: Referring Jersey Espinoza: Glendale Chard Treating  Aidenjames Heckmann/Extender: Frann Rider in Treatment: 24 Encounter Discharge Information Items Discharge Pain Level: 0 Discharge Condition: Stable Ambulatory Status: Walker Discharge Destination: Home Transportation: Private Auto Accompanied By: self Schedule Follow-up Appointment: No Medication Reconciliation completed No and provided to Patient/Care Yanet Balliet: Provided on Clinical Summary of Care: 07/24/2016 Form Type Recipient Paper Patient DG Electronic Signature(s) Signed: 07/24/2016 2:19:16 PM By: Montey Hora Previous Signature: 07/24/2016 10:57:53 AM Version By: Ruthine Dose Entered By: Montey Hora on 07/24/2016 14:19:16 [name] ([id number]) -------------------------------------------------------------------------------- Lower Extremity Assessment Details Patient Name: [name]. Date of Service: 07/24/2016 10:30 AM Medical Record Number: [id number] Patient Account Number: [id number] Date of Birth/Sex: [birth year] (75 y.o. Female) Treating RN: Montey Hora Primary Care Azaria Stegman: Glendale Chard Other Clinician: Referring Jannetta Massey: Glendale Chard Treating Krystn Dermody/Extender: Frann Rider in Treatment: 24 Edema Assessment Assessed: [Left: No] [Right: No] E[Left: dema] [Right: :] Calf Left: Right: Point of Measurement: 34 cm From Medial Instep 46.3 cm cm Ankle Left: Right: Point of Measurement: 11 cm From Medial Instep 31.4 cm cm Vascular Assessment Pulses: Dorsalis Pedis Palpable: [Left:Yes] Posterior Tibial Extremity colors, hair growth, and conditions: Extremity Color: [Left:Hyperpigmented] Hair Growth on Extremity: [Left:No] Temperature of Extremity: [Left:Warm] Capillary Refill: [Left:< 3 seconds] Electronic Signature(s) Signed: 07/24/2016 5:22:00 PM By: Montey Hora Entered By: Montey Hora on 07/24/2016 10:48:57 [name]  ([id number]) -------------------------------------------------------------------------------- Multi Wound Chart Details Patient Name: [name]. Date of Service: 07/24/2016 10:30 AM Medical Record Number: [id number] Patient Account Number: [id number] Date of Birth/Sex: [birth year] (75 y.o. Female) Treating RN: Montey Hora Primary Care Clebert Wenger: Glendale Chard Other Clinician: Referring Viana Sleep: Glendale Chard Treating Chimaobi Casebolt/Extender: Frann Rider in Treatment: 24 Vital Signs Height(in): 64 Pulse(bpm): 102 Weight(lbs): 283 Blood Pressure 85/54 (mmHg): Body Mass Index(BMI): 49 Temperature(F): 97.8 Respiratory Rate 18 (breaths/min): Photos: [1:No Photos] [N/A:N/A] Wound Location: [1:Left, Lateral Lower Leg] [N/A:N/A] Wounding Event: [1:Gradually  Appeared] [N/A:N/A] Primary Etiology: [1:Diabetic Wound/Ulcer of the Lower Extremity] [N/A:N/A] Date Acquired: [1:06/27/2016] [N/A:N/A] Weeks of Treatment: [1:1] [N/A:N/A] Wound Status: [1:Open] [N/A:N/A] Measurements L x W x D 0x0x0 [N/A:N/A] (cm) Area (cm) : [1:0] [N/A:N/A] Volume (cm) : [1:0] [N/A:N/A] % Reduction in Area: [1:100.00%] [N/A:N/A] % Reduction in Volume: 100.00% [N/A:N/A] Classification: [1:Grade 1] [N/A:N/A] Periwound Skin Texture: No Abnormalities Noted [N/A:N/A] Periwound Skin [1:No Abnormalities Noted] [N/A:N/A] Moisture: Periwound Skin Color: No Abnormalities Noted [N/A:N/A] Tenderness on [1:No] [N/A:N/A] Treatment Notes Electronic Signature(s) Signed: 07/24/2016 11:04:08 AM By: Christin Fudge MD, FACS Entered By: Christin Fudge on 07/24/2016 11:04:07 [name] ([id number] Mcdaniel ([id number]) -------------------------------------------------------------------------------- [place] Details Patient Name: [name]. Date of Service: 07/24/2016 10:30 AM Medical Record Number: [id number] Patient Account Number: [id number] Date of Birth/Sex:  Apr 29, 1941 (75 y.o. Female) Treating RN: Montey Hora Primary Care Adine Heimann: Glendale Chard Other Clinician: Referring Karlye Ihrig: Glendale Chard Treating Dametra Whetsel/Extender: Frann Rider in Treatment: 24 Active Inactive Electronic Signature(s) Signed: 07/24/2016 5:22:00 PM By: Montey Hora Entered By: Montey Hora on 07/24/2016 10:49:29 [name] ([id number]) -------------------------------------------------------------------------------- Pain Assessment Details Patient Name: [name]. Date of Service: 07/24/2016 10:30 AM Medical Record Number: [id number] Patient Account Number: [id number] Date of Birth/Sex: 08/30/1940 (75 y.o. Female) Treating RN: Montey Hora Primary Care Katherina Wimer: Glendale Chard Other Clinician: Referring Eashan Schipani: Glendale Chard Treating Odaliz Mcqueary/Extender: Frann Rider in Treatment: 24 Active Problems Location of Pain Severity and Description of Pain Patient Has Paino No Site Locations Pain Management and Medication Current Pain Management: Notes Topical or injectable lidocaine is offered to patient for acute pain when surgical debridement is performed. If needed, Patient is instructed to use over the counter pain medication for the following 24-48 hours after debridement. Wound care MDs do not prescribed pain medications. Patient has chronic pain or uncontrolled pain. Patient has been instructed to make an appointment with their Primary Care Physician for pain management. Electronic Signature(s) Signed: 07/24/2016 5:22:00 PM By: Montey Hora Entered By: Montey Hora on 07/24/2016 10:39:23 [name] ([id number]) -------------------------------------------------------------------------------- Patient/Caregiver Education Details Patient Name: [name]. Date of Service: 07/24/2016 10:30 AM Medical Record Number: [id number] Patient Account Number: [id number] Date of Birth/Gender: 1940-12-05 (75 y.o.  Female) Treating RN: Montey Hora Primary Care Physician: Glendale Chard Other Clinician: Referring Physician: Glendale Chard Treating Physician/Extender: Frann Rider in Treatment: 24 Education Assessment Education Provided To: Patient Education Topics Provided Venous: Handouts: Other: compression daily Methods: Explain/Verbal Responses: State content correctly Electronic Signature(s) Signed: 07/24/2016 5:22:00 PM By: Montey Hora Entered By: Montey Hora on 07/24/2016 14:19:33 [name] ([id number]) -------------------------------------------------------------------------------- Wound Assessment Details Patient Name: [name]. Date of Service: 07/24/2016 10:30 AM Medical Record Number: [id number] Patient Account Number: [id number] Date of Birth/Sex: [birth year] (75 y.o. Female) Treating RN: Montey Hora Primary Care Amyra Vantuyl: Glendale Chard Other Clinician: Referring Nusaiba Guallpa: Glendale Chard Treating Gale Klar/Extender: Frann Rider in Treatment: 24 Wound Status Wound Number: 1 Primary Diabetic Wound/Ulcer of the Lower Etiology: Extremity Wound Location: Left, Lateral Lower Leg Wound Status: Open Wounding Event: Gradually Appeared Date Acquired: 06/27/2016 Weeks Of Treatment: 1 Clustered Wound: No Wound Measurements Length: (cm) 0 % Reduction Width: (cm) 0 % Reduction Depth: (cm) 0 Area: (cm) 0 Volume: (cm) 0 in Area: 100% in Volume: 100% Wound Description Classification: Grade 1 Periwound Skin Texture Texture Color No Abnormalities Noted: No No Abnormalities Noted: No Moisture No Abnormalities Noted: No Electronic Signature(s) Signed: 07/24/2016 5:22:00 PM By: Montey Hora Entered By: Montey Hora on 07/24/2016  10:45:36 [name], [name] ([id number]) -------------------------------------------------------------------------------- Vitals Details Patient Name: [name], BURRESS. Date of Service: 07/24/2016 10:30  AM Medical Record Number: [id number] Patient Account Number: [id number] Date of Birth/Sex: 05-06-1941 (75 y.o. Female) Treating RN: Montey Hora Primary Care Mayank Teuscher: Glendale Chard Other Clinician: Referring Delman Goshorn: Glendale Chard Treating Damontae Loppnow/Extender: Frann Rider in Treatment: 24 Vital Signs Time Taken: 10:39 Temperature (F): 97.8 Height (in): 64 Pulse (bpm): 102 Weight (lbs): 283 Respiratory Rate (breaths/min): 18 Body Mass Index (BMI): 48.6 Blood Pressure (mmHg): 85/54 Reference Range: 80 - 120 mg / dl Electronic Signature(s) Signed: 07/24/2016 5:22:00 PM By: Montey Hora Entered By: Montey Hora on 07/24/2016 10:41:26

## 2016-07-29 DIAGNOSIS — H25013 Cortical age-related cataract, bilateral: Secondary | ICD-10-CM | POA: Diagnosis not present

## 2016-07-29 DIAGNOSIS — H5203 Hypermetropia, bilateral: Secondary | ICD-10-CM | POA: Diagnosis not present

## 2016-07-29 DIAGNOSIS — H2513 Age-related nuclear cataract, bilateral: Secondary | ICD-10-CM | POA: Diagnosis not present

## 2016-07-29 DIAGNOSIS — Z79899 Other long term (current) drug therapy: Secondary | ICD-10-CM | POA: Diagnosis not present

## 2016-07-29 DIAGNOSIS — H209 Unspecified iridocyclitis: Secondary | ICD-10-CM | POA: Diagnosis not present

## 2016-07-29 DIAGNOSIS — M069 Rheumatoid arthritis, unspecified: Secondary | ICD-10-CM | POA: Diagnosis not present

## 2016-07-30 ENCOUNTER — Other Ambulatory Visit: Payer: Self-pay | Admitting: Specialist

## 2016-07-30 ENCOUNTER — Ambulatory Visit
Admission: RE | Admit: 2016-07-30 | Discharge: 2016-07-30 | Disposition: A | Discharge status: Home / Self Care | Payer: Medicare Other | Source: Ambulatory Visit | Attending: Specialist | Admitting: Specialist

## 2016-07-30 DIAGNOSIS — H20012 Primary iridocyclitis, left eye: Secondary | ICD-10-CM | POA: Diagnosis not present

## 2016-07-30 DIAGNOSIS — H20013 Primary iridocyclitis, bilateral: Secondary | ICD-10-CM

## 2016-07-30 DIAGNOSIS — R0602 Shortness of breath: Secondary | ICD-10-CM | POA: Diagnosis not present

## 2016-07-30 DIAGNOSIS — H43811 Vitreous degeneration, right eye: Secondary | ICD-10-CM | POA: Diagnosis not present

## 2016-07-30 DIAGNOSIS — H35412 Lattice degeneration of retina, left eye: Secondary | ICD-10-CM | POA: Diagnosis not present

## 2016-07-30 DIAGNOSIS — H20011 Primary iridocyclitis, right eye: Secondary | ICD-10-CM | POA: Diagnosis not present

## 2016-07-30 DIAGNOSIS — Z79899 Other long term (current) drug therapy: Secondary | ICD-10-CM | POA: Diagnosis not present

## 2016-07-30 DIAGNOSIS — E119 Type 2 diabetes mellitus without complications: Secondary | ICD-10-CM | POA: Diagnosis not present

## 2016-08-05 ENCOUNTER — Encounter: Payer: Self-pay | Admitting: Podiatry

## 2016-08-05 ENCOUNTER — Ambulatory Visit (INDEPENDENT_AMBULATORY_CARE_PROVIDER_SITE_OTHER): Payer: Medicare Other | Admitting: Podiatry

## 2016-08-05 DIAGNOSIS — B351 Tinea unguium: Secondary | ICD-10-CM

## 2016-08-05 DIAGNOSIS — M79676 Pain in unspecified toe(s): Secondary | ICD-10-CM | POA: Diagnosis not present

## 2016-08-05 DIAGNOSIS — M722 Plantar fascial fibromatosis: Secondary | ICD-10-CM

## 2016-08-05 NOTE — Progress Notes (Signed)
This patient presents the office with 2 chief complaints. She says that her nails have grown long and thick and have become painful walking and wearing her shoes. She says she was in an accident which caused a severe leg wound to her left extremity which she has been treating for year. She also states that she is starting to have pain in her left heel upon rising in the morning and standing from sitting position. She says that she also has pain down through the arch as she walks this condition has been worsening over time and she presents the office today for an evaluation of her left foot as well as debridement of her long painful nails.  Patient is diabetic.   GENERAL APPEARANCE: Alert, conversant. Appropriately groomed. No acute distress.  VASCULAR: Pedal pulses are  palpable at  [facility] and  bilateral. Her PT pulses are non palpable secondary to her severe swelling. Capillary refill time is immediate to all digits,  Normal temperature gradient.   NEUROLOGIC: sensation is diminished  to 5.07 monofilament at 5/5 sites bilateral.  Light touch is intact bilateral, Muscle strength normal.  MUSCULOSKELETAL: acceptable muscle strength, tone and stability bilateral.  Intrinsic muscluature intact bilateral.  Rectus appearance of foot and digits noted bilateral. Palpable pain at insertion plantar fascia left foot.  Pain along the course of the plantar fascia left foot.  DERMATOLOGIC: skin color, texture, and turgor are within normal limits.  No preulcerative lesions or ulcers  are seen, no interdigital maceration noted.  No open lesions present.   No drainage noted.   NAILS  Thick disfigured discolored nails both feet.  Diagnosis  Onychomycosis  Plantar fascitis left heel.  Diabetic.  IE  debridement of the mycotic nails injection therapy in the left heel dispensing of power step insoles to be worn in her shoes. Return to the office in 3 months for continued nail care at that visit, we will consider obtaining  diabetic shoes since we believe her swelling will have subsided at that time   Gardiner Barefoot DPM

## 2016-08-07 ENCOUNTER — Telehealth: Payer: Self-pay | Admitting: *Deleted

## 2016-08-07 NOTE — Telephone Encounter (Signed)
[name] was requesting a call back.  She did not specify what this was about.  I attempted to reach her on the number left [phone]. No answer and no vm.  I left a VM on her mobile number to call back.

## 2016-08-11 ENCOUNTER — Encounter: Payer: Medicare Other | Admitting: Registered Nurse

## 2016-08-11 DIAGNOSIS — J8410 Pulmonary fibrosis, unspecified: Secondary | ICD-10-CM | POA: Diagnosis not present

## 2016-08-11 DIAGNOSIS — Z79899 Other long term (current) drug therapy: Secondary | ICD-10-CM | POA: Diagnosis not present

## 2016-08-11 DIAGNOSIS — J479 Bronchiectasis, uncomplicated: Secondary | ICD-10-CM | POA: Diagnosis not present

## 2016-08-11 DIAGNOSIS — R0609 Other forms of dyspnea: Secondary | ICD-10-CM | POA: Diagnosis not present

## 2016-08-11 DIAGNOSIS — Z6841 Body Mass Index (BMI) 40.0 and over, adult: Secondary | ICD-10-CM | POA: Diagnosis not present

## 2016-08-11 DIAGNOSIS — M109 Gout, unspecified: Secondary | ICD-10-CM | POA: Diagnosis not present

## 2016-08-11 DIAGNOSIS — M069 Rheumatoid arthritis, unspecified: Secondary | ICD-10-CM | POA: Diagnosis not present

## 2016-08-13 ENCOUNTER — Encounter: Payer: Self-pay | Admitting: Registered Nurse

## 2016-08-13 ENCOUNTER — Encounter: Payer: Medicare Other | Attending: Physical Medicine & Rehabilitation | Admitting: Registered Nurse

## 2016-08-13 VITALS — BP 112/73 | HR 90 | Resp 16

## 2016-08-13 DIAGNOSIS — M2550 Pain in unspecified joint: Secondary | ICD-10-CM

## 2016-08-13 DIAGNOSIS — J449 Chronic obstructive pulmonary disease, unspecified: Secondary | ICD-10-CM | POA: Diagnosis not present

## 2016-08-13 DIAGNOSIS — M170 Bilateral primary osteoarthritis of knee: Secondary | ICD-10-CM | POA: Insufficient documentation

## 2016-08-13 DIAGNOSIS — M545 Low back pain: Secondary | ICD-10-CM | POA: Diagnosis not present

## 2016-08-13 DIAGNOSIS — M62838 Other muscle spasm: Secondary | ICD-10-CM | POA: Diagnosis not present

## 2016-08-13 DIAGNOSIS — M47816 Spondylosis without myelopathy or radiculopathy, lumbar region: Secondary | ICD-10-CM

## 2016-08-13 DIAGNOSIS — M7062 Trochanteric bursitis, left hip: Secondary | ICD-10-CM | POA: Diagnosis not present

## 2016-08-13 DIAGNOSIS — M1288 Other specific arthropathies, not elsewhere classified, other specified site: Secondary | ICD-10-CM

## 2016-08-13 DIAGNOSIS — G894 Chronic pain syndrome: Secondary | ICD-10-CM

## 2016-08-13 DIAGNOSIS — F329 Major depressive disorder, single episode, unspecified: Secondary | ICD-10-CM | POA: Diagnosis not present

## 2016-08-13 DIAGNOSIS — Z79899 Other long term (current) drug therapy: Secondary | ICD-10-CM

## 2016-08-13 DIAGNOSIS — M1711 Unilateral primary osteoarthritis, right knee: Secondary | ICD-10-CM | POA: Diagnosis not present

## 2016-08-13 DIAGNOSIS — M48062 Spinal stenosis, lumbar region with neurogenic claudication: Secondary | ICD-10-CM | POA: Insufficient documentation

## 2016-08-13 DIAGNOSIS — J8410 Pulmonary fibrosis, unspecified: Secondary | ICD-10-CM | POA: Insufficient documentation

## 2016-08-13 DIAGNOSIS — Z808 Family history of malignant neoplasm of other organs or systems: Secondary | ICD-10-CM | POA: Insufficient documentation

## 2016-08-13 DIAGNOSIS — Z803 Family history of malignant neoplasm of breast: Secondary | ICD-10-CM | POA: Diagnosis not present

## 2016-08-13 DIAGNOSIS — Z801 Family history of malignant neoplasm of trachea, bronchus and lung: Secondary | ICD-10-CM | POA: Insufficient documentation

## 2016-08-13 DIAGNOSIS — Z8051 Family history of malignant neoplasm of kidney: Secondary | ICD-10-CM | POA: Insufficient documentation

## 2016-08-13 DIAGNOSIS — E7800 Pure hypercholesterolemia, unspecified: Secondary | ICD-10-CM | POA: Diagnosis not present

## 2016-08-13 DIAGNOSIS — E1122 Type 2 diabetes mellitus with diabetic chronic kidney disease: Secondary | ICD-10-CM | POA: Diagnosis not present

## 2016-08-13 DIAGNOSIS — D689 Coagulation defect, unspecified: Secondary | ICD-10-CM | POA: Insufficient documentation

## 2016-08-13 DIAGNOSIS — E079 Disorder of thyroid, unspecified: Secondary | ICD-10-CM | POA: Insufficient documentation

## 2016-08-13 DIAGNOSIS — Z833 Family history of diabetes mellitus: Secondary | ICD-10-CM | POA: Diagnosis not present

## 2016-08-13 DIAGNOSIS — I129 Hypertensive chronic kidney disease with stage 1 through stage 4 chronic kidney disease, or unspecified chronic kidney disease: Secondary | ICD-10-CM | POA: Diagnosis not present

## 2016-08-13 DIAGNOSIS — Z9889 Other specified postprocedural states: Secondary | ICD-10-CM | POA: Diagnosis not present

## 2016-08-13 DIAGNOSIS — M1712 Unilateral primary osteoarthritis, left knee: Secondary | ICD-10-CM | POA: Diagnosis not present

## 2016-08-13 DIAGNOSIS — Z5181 Encounter for therapeutic drug level monitoring: Secondary | ICD-10-CM

## 2016-08-13 DIAGNOSIS — E1142 Type 2 diabetes mellitus with diabetic polyneuropathy: Secondary | ICD-10-CM | POA: Diagnosis not present

## 2016-08-13 DIAGNOSIS — M069 Rheumatoid arthritis, unspecified: Secondary | ICD-10-CM | POA: Insufficient documentation

## 2016-08-13 DIAGNOSIS — N189 Chronic kidney disease, unspecified: Secondary | ICD-10-CM | POA: Diagnosis not present

## 2016-08-13 DIAGNOSIS — R200 Anesthesia of skin: Secondary | ICD-10-CM | POA: Diagnosis not present

## 2016-08-13 DIAGNOSIS — E1140 Type 2 diabetes mellitus with diabetic neuropathy, unspecified: Secondary | ICD-10-CM

## 2016-08-13 DIAGNOSIS — Z8249 Family history of ischemic heart disease and other diseases of the circulatory system: Secondary | ICD-10-CM | POA: Insufficient documentation

## 2016-08-13 MED ORDER — MORPHINE SULFATE 15 MG PO TABS: 15.0000 mg | ORAL_TABLET | Freq: Three times a day (TID) | ORAL | 0 refills | Status: DC | PRN

## 2016-08-13 MED ORDER — MORPHINE SULFATE ER 15 MG PO TBCR: 15.0000 mg | EXTENDED_RELEASE_TABLET | Freq: Two times a day (BID) | ORAL | 0 refills | Status: DC

## 2016-08-13 NOTE — Progress Notes (Signed)
Subjective:    Patient ID: [name], female    DOB: Jun 29, 1940, 76 y.o.   MRN: [id number]  HPI: [name] is a 76 year old female who returns for follow up appointment for chronic painand medication refill. She states her pain is located in her bilateral hands R>L, lower back radiating into her left hip, left lower extremity wound pain and bilateral knee pain. [name] receiving wound care and Home Health service for left lower extremity wound, bandage intact.  She ratesher pain 3. Her current exercise regime is performing chair exercises and walking.   Pain Inventory Average Pain 5 Pain Right Now 3 My pain is sharp and aching  In the last 24 hours, has pain interfered with the following? General activity 0 Relation with others 0 Enjoyment of life 0 What TIME of day is your pain at its worst? morning Sleep (in general) Fair  Pain is worse with: walking, bending, standing and some activites Pain improves with: rest, heat/ice and medication Relief from Meds: no selection  Mobility walk with assistance use a cane use a walker ability to climb steps?  no do you drive?  no use a wheelchair Do you have any goals in this area?  no  Function retired I need assistance with the following:  meal prep, household duties and shopping  Neuro/Psych bladder control problems numbness trouble walking dizziness  Prior Studies Any changes since last visit?  no  Physicians involved in your care Any changes since last visit?  no   Family History  Problem Relation Age of Onset  . Diabetes Mother   . Heart attack Mother   . Hypertension Father   . Lung cancer Father   . Diabetes Sister   . Diabetes Brother   . Hypertension Brother   . Heart disease Brother   . Heart attack Brother   . Kidney cancer Brother   . Uterine cancer Daughter   . Breast cancer Sister   . Rheum arthritis Maternal Uncle   . Gout Brother   . Kidney failure Brother     x 5    Social History   Social History  . Marital status: Married    Spouse name: N/A  . Number of children: 6  . Years of education: college   Occupational History  . retired Marine scientist    Social History Main Topics  . Smoking status: Never Smoker  . Smokeless tobacco: Never Used  . Alcohol use No  . Drug use: No  . Sexual activity: Not Currently   Other Topics Concern  . None   Social History Narrative   Patient consumes 2-3 cups coffee per day.   Past Surgical History:  Procedure Laterality Date  . ABDOMINAL HYSTERECTOMY    . BRAIN SURGERY     Gamma knife 10/13. Needs repeat spring  '14  . CARDIAC CATHETERIZATION N/A 10/31/2014   Procedure: Right/Left Heart Cath and Coronary Angiography;  Surgeon: [name], MD;  Location: [facility];  Service: Cardiovascular;  Laterality: N/A;  . ESOPHAGOGASTRODUODENOSCOPY (EGD) WITH PROPOFOL N/A 09/14/2014   Procedure: ESOPHAGOGASTRODUODENOSCOPY (EGD) WITH PROPOFOL;  Surgeon: [name], MD;  Location: [facility];  Service: Endoscopy;  Laterality: N/A;  . OVARY SURGERY    . SHOULDER SURGERY Left   . TONSILLECTOMY  age 39  . VIDEO BRONCHOSCOPY Bilateral 05/31/2013   Procedure: VIDEO BRONCHOSCOPY WITHOUT FLUORO;  Surgeon: Brand Males, MD;  Location: [facility];  Service: Cardiopulmonary;  Laterality:  Bilateral;  . video bronscoscopy  2000   lung   Past Medical History:  Diagnosis Date  . Arthritis    endstage changes bilateral knees/bilateral ankles.   . Asthma   . Carotid artery occlusion   . Chronic kidney disease   . Clotting disorder ([place])    pt denies this  . Contusion of left knee    due to fall 1/14.  [name] COPD (chronic obstructive pulmonary disease) (HCC)    pulmonary fibrosis  . Depression, reactive   . Diabetes mellitus   . Diastolic dysfunction   . Family history of heart disease   . High cholesterol   . Hypertension   . Interstitial lung disease ([place])   . Meningioma of left sphenoid wing  involving cavernous sinus (HCC) 02/17/2012   Continue diplopia, left eye pain and left headaches.    . Morbid obesity ([place])   . Normal coronary arteries    cardiac catheterization performed  10/31/14  . RA (rheumatoid arthritis) ([place])    has been off methotreaxte since 10/13.  [name] Shortness of breath   . Thyroid disease   . URI (upper respiratory infection)    BP 112/73 (BP Location: Left Wrist, Patient Position: Sitting, Cuff Size: Normal)   Pulse 90   Resp 16   SpO2 90%   Opioid Risk Score:   Fall Risk Score:  `1  Depression screen PHQ 2/9  Depression screen [facility] 2/9 08/09/2015 01/05/2015 09/20/2014 08/10/2013  Decreased Interest _0 -  Down, Depressed, Hopeless _1 PHQ - 2 Score _2 Altered sleeping - 2 0 -  Tired, decreased energy - 2 3 -  Change in appetite - 2 3 -  Feeling bad or failure about yourself  - 0 3 -  Trouble concentrating - 1 1 -  Moving slowly or fidgety/restless - 0 2 -  Suicidal thoughts - 0 0 -  PHQ-9 Score - 10 16 -  Difficult doing work/chores - Somewhat difficult - -  Some recent data might be hidden    Review of Systems  Constitutional: Negative.   HENT: Negative.   Eyes: Negative.   Respiratory: Negative.   Cardiovascular: Negative.   Gastrointestinal: Negative.   Endocrine: Negative.   Genitourinary: Positive for difficulty urinating.  Musculoskeletal: Positive for back pain and gait problem.  Allergic/Immunologic: Negative.   Neurological: Positive for dizziness and numbness.  Hematological: Negative.   Psychiatric/Behavioral: Negative.   All other systems reviewed and are negative.      Objective:   Physical Exam  Constitutional: She is oriented to person, place, and time. She appears well-developed and well-nourished.  HENT:  Head: Normocephalic and atraumatic.  Neck: Normal range of motion. Neck supple.  Cardiovascular: Normal rate and regular rhythm.   Pulmonary/Chest: Effort normal and breath sounds normal.    Musculoskeletal: She exhibits edema.  Normal Muscle Bulk and Muscle Testing Reveals: Upper Extremities: Full ROM and Muscle Strength 5/5 Thoracic Paraspinal Tenderness: T-7-T-9 Lower Extremities: Full ROM and Muscle Strength 5/5 + Pitting Edema Right Lower Extremity with compression Stocking Left Lower Extremity with Velcro Wrap Arises from Table slowly using walker for support Narrow Based Gait  Neurological: She is alert and oriented to person, place, and time.  Skin: Skin is warm and dry.  Psychiatric: She has a normal mood and affect.  Nursing note and vitals reviewed.         Assessment & Plan:  1. Lumbar spinal stenosis with  neurogenic claudication. Associated facet arthropathy: Continue Gabapentinand HEP as tolerated. 08/13/2016 Refilled : MS Contin 15 mg one tablet every 12 hours #60 andMSIR 15 mg 1 one tablet every 8hours as needed #90.  We will continue the opioid monitoring program, this consists of regular clinic visits, examinations, urine drug screen, pill counts as well as use of [place] Controlled Substance reporting System. 2. Depression: Continue Cymbalta. 08/13/2016 3. Diabetes mellitus type 2 with polyneuropathy: Continue Gabapentin. 08/13/2016 4. Rheumatoid arthritis and osteoarthritis. Contnue Voltaren Gel. Rheumatology Following. 08/13/2016 5. Interstitial lung disease: Pulmonology Following. 08/13/2016 6. Bilateral Osteoarthritis Knee's: Continue Voltaren Gel.08/13/2016 7. Muscle Spasm: Continue : Flexeril. 08/13/2016 8. Left Greater Trochanteric tenderness: No complaints today.Continue with Ice/Heat Therapy. 08/13/2016 9. Polyarthralgia: Rheumatology Following: Continue to monitor. 08/13/2016 10. Morbid Obesitity: Continue Healthy Diet Regime and HEP. 08/13/2016.   20 minutes of face to face patient care time was spent during this visit. All questions were encouraged and answered.  F/U in 1 month

## 2016-08-28 ENCOUNTER — Telehealth: Payer: Self-pay | Admitting: Internal Medicine

## 2016-08-28 NOTE — Telephone Encounter (Signed)
Please have her try OTC decongestant like tylenol cold and flu,  Also consider starting fluticasone nasal spray, 2 each nostril daily, if she is not on a nasal steroid already.  Call us back for further eval if no better

## 2016-08-28 NOTE — Telephone Encounter (Signed)
Called spoke with patient and discussed RB's recommendations with her.  Pt wrote down and read back to me RB's recs and did mention that she is already taking Flonase daily - she will continue this.  Advised pt to call the office if her symptoms do not improve or they worsen.  Pt voiced her understanding.  Nothing further needed; will sign off.

## 2016-08-28 NOTE — Telephone Encounter (Signed)
Spoke with pt, who reports of sore throat,non prod cough & voiced hoarseness x2-3d Denies any fever, chills or sweats. Sob is baseline. Pt on 31m prednisone daily.   RB please advise, as MR is night float. thanks  Allergies  Allergen Reactions  . Codeine Swelling    Facial swelling  . Remicade [Infliximab] Anaphylaxis    "sent me into shock"  . Zestril [Lisinopril] Swelling    Face and neck swelling

## 2016-09-04 ENCOUNTER — Telehealth: Payer: Self-pay | Admitting: Internal Medicine

## 2016-09-04 ENCOUNTER — Encounter: Payer: Self-pay | Admitting: Registered Nurse

## 2016-09-04 ENCOUNTER — Encounter: Payer: Medicare Other | Attending: Physical Medicine & Rehabilitation | Admitting: Registered Nurse

## 2016-09-04 VITALS — BP 108/74 | HR 88

## 2016-09-04 DIAGNOSIS — G894 Chronic pain syndrome: Secondary | ICD-10-CM

## 2016-09-04 DIAGNOSIS — M2550 Pain in unspecified joint: Secondary | ICD-10-CM | POA: Diagnosis not present

## 2016-09-04 DIAGNOSIS — F329 Major depressive disorder, single episode, unspecified: Secondary | ICD-10-CM | POA: Diagnosis not present

## 2016-09-04 DIAGNOSIS — Z79899 Other long term (current) drug therapy: Secondary | ICD-10-CM

## 2016-09-04 DIAGNOSIS — J8410 Pulmonary fibrosis, unspecified: Secondary | ICD-10-CM | POA: Diagnosis not present

## 2016-09-04 DIAGNOSIS — Z8051 Family history of malignant neoplasm of kidney: Secondary | ICD-10-CM | POA: Insufficient documentation

## 2016-09-04 DIAGNOSIS — M4696 Unspecified inflammatory spondylopathy, lumbar region: Secondary | ICD-10-CM

## 2016-09-04 DIAGNOSIS — E1142 Type 2 diabetes mellitus with diabetic polyneuropathy: Secondary | ICD-10-CM | POA: Insufficient documentation

## 2016-09-04 DIAGNOSIS — M7062 Trochanteric bursitis, left hip: Secondary | ICD-10-CM | POA: Insufficient documentation

## 2016-09-04 DIAGNOSIS — M545 Low back pain: Secondary | ICD-10-CM | POA: Diagnosis not present

## 2016-09-04 DIAGNOSIS — I129 Hypertensive chronic kidney disease with stage 1 through stage 4 chronic kidney disease, or unspecified chronic kidney disease: Secondary | ICD-10-CM | POA: Insufficient documentation

## 2016-09-04 DIAGNOSIS — Z5181 Encounter for therapeutic drug level monitoring: Secondary | ICD-10-CM | POA: Diagnosis not present

## 2016-09-04 DIAGNOSIS — M069 Rheumatoid arthritis, unspecified: Secondary | ICD-10-CM | POA: Insufficient documentation

## 2016-09-04 DIAGNOSIS — Z801 Family history of malignant neoplasm of trachea, bronchus and lung: Secondary | ICD-10-CM | POA: Diagnosis not present

## 2016-09-04 DIAGNOSIS — M1711 Unilateral primary osteoarthritis, right knee: Secondary | ICD-10-CM

## 2016-09-04 DIAGNOSIS — Z803 Family history of malignant neoplasm of breast: Secondary | ICD-10-CM | POA: Insufficient documentation

## 2016-09-04 DIAGNOSIS — E1122 Type 2 diabetes mellitus with diabetic chronic kidney disease: Secondary | ICD-10-CM | POA: Insufficient documentation

## 2016-09-04 DIAGNOSIS — Z808 Family history of malignant neoplasm of other organs or systems: Secondary | ICD-10-CM | POA: Diagnosis not present

## 2016-09-04 DIAGNOSIS — J449 Chronic obstructive pulmonary disease, unspecified: Secondary | ICD-10-CM | POA: Insufficient documentation

## 2016-09-04 DIAGNOSIS — M62838 Other muscle spasm: Secondary | ICD-10-CM

## 2016-09-04 DIAGNOSIS — M47816 Spondylosis without myelopathy or radiculopathy, lumbar region: Secondary | ICD-10-CM

## 2016-09-04 DIAGNOSIS — Z833 Family history of diabetes mellitus: Secondary | ICD-10-CM | POA: Diagnosis not present

## 2016-09-04 DIAGNOSIS — M1712 Unilateral primary osteoarthritis, left knee: Secondary | ICD-10-CM

## 2016-09-04 DIAGNOSIS — M170 Bilateral primary osteoarthritis of knee: Secondary | ICD-10-CM | POA: Insufficient documentation

## 2016-09-04 DIAGNOSIS — Z8249 Family history of ischemic heart disease and other diseases of the circulatory system: Secondary | ICD-10-CM | POA: Diagnosis not present

## 2016-09-04 DIAGNOSIS — M48062 Spinal stenosis, lumbar region with neurogenic claudication: Secondary | ICD-10-CM | POA: Insufficient documentation

## 2016-09-04 DIAGNOSIS — N189 Chronic kidney disease, unspecified: Secondary | ICD-10-CM | POA: Insufficient documentation

## 2016-09-04 DIAGNOSIS — Z9889 Other specified postprocedural states: Secondary | ICD-10-CM | POA: Diagnosis not present

## 2016-09-04 DIAGNOSIS — R200 Anesthesia of skin: Secondary | ICD-10-CM | POA: Diagnosis not present

## 2016-09-04 DIAGNOSIS — D689 Coagulation defect, unspecified: Secondary | ICD-10-CM | POA: Insufficient documentation

## 2016-09-04 DIAGNOSIS — E1140 Type 2 diabetes mellitus with diabetic neuropathy, unspecified: Secondary | ICD-10-CM | POA: Diagnosis not present

## 2016-09-04 DIAGNOSIS — E079 Disorder of thyroid, unspecified: Secondary | ICD-10-CM | POA: Insufficient documentation

## 2016-09-04 DIAGNOSIS — E7800 Pure hypercholesterolemia, unspecified: Secondary | ICD-10-CM | POA: Insufficient documentation

## 2016-09-04 MED ORDER — MORPHINE SULFATE ER 15 MG PO TBCR: 15.0000 mg | EXTENDED_RELEASE_TABLET | Freq: Two times a day (BID) | ORAL | 0 refills | Status: DC

## 2016-09-04 NOTE — Telephone Encounter (Signed)
Offer doxycycline 100 mg, # 14, 1 twice daily    And    Prednisone 20 mg, # 3   1 daily x 3 days

## 2016-09-04 NOTE — Telephone Encounter (Addendum)
Called and spoke with pt and she stated that she has been sick x 2 weeks.  She called after 2 days of being sick and was told to take tylenol flu and this has helped break the stuff up in her chest.    She stated that now she is coughing up dark yellow sputum and wheezing and has some nasal congetion.  She denies any fever or chills.  She is wanting to know if there is something that can be given to her.  MR is off this afternoon.  CY please advise. Thanks   [phone] call pt on cell number.  Allergies  Allergen Reactions  . Codeine Swelling    Facial swelling  . Remicade [Infliximab] Anaphylaxis    "sent me into shock"  . Zestril [Lisinopril] Swelling    Face and neck swelling    Current Outpatient Prescriptions on File Prior to Visit  Medication Sig Dispense Refill  . ACCU-CHEK SMARTVIEW test strip 1 each by Other route every morning.   1  . acetaminophen (TYLENOL) 500 MG tablet Take 1,000 mg by mouth every 6 (six) hours as needed for moderate pain.     [name] albuterol (PROVENTIL HFA;VENTOLIN HFA) 108 (90 BASE) MCG/ACT inhaler Inhale 2 puffs into the lungs every 6 (six) hours as needed for wheezing or shortness of breath.    . allopurinol (ZYLOPRIM) 300 MG tablet Take by mouth.    [name] aspirin EC 81 MG tablet Take 81 mg by mouth every morning.     [name] atorvastatin (LIPITOR) 20 MG tablet Take 10 mg by mouth every evening.     [name] azithromycin (ZITHROMAX) 500 MG tablet Take 1 tablet by mouth daily. TAKE ON MON, WED & FRI  0  . Calcium Carb-Cholecalciferol (CALCIUM + D3 PO) Take 2 tablets by mouth daily with lunch.     . cholecalciferol (VITAMIN D) 1000 UNITS tablet Take 2,000 Units by mouth daily with lunch.     . cyclobenzaprine (FLEXERIL) 5 MG tablet Take 1 tablet (5 mg total) by mouth at bedtime as needed for muscle spasms. 30 tablet 1  . diclofenac sodium (VOLTAREN) 1 % GEL Apply 4 g topically 4 (four) times daily. Voltaren Gel 3 grams to 3 large joints upto TID 3 TUBES with 3 refills 3 Tube  3  . DULoxetine (CYMBALTA) 60 MG capsule Take 1 capsule (60 mg total) by mouth daily. 30 capsule 4  . fluticasone (FLONASE) 50 MCG/ACT nasal spray Place 2 sprays into both nostrils daily. 16 g 3  . furosemide (LASIX) 20 MG tablet Take 20 mg by mouth daily.   1  . gabapentin (NEURONTIN) 600 MG tablet Take two in the morning , Two mid- afternoon and two at bedtime 540 tablet 0  . guaiFENesin (MUCINEX) 600 MG 12 hr tablet Take 1,200 mg by mouth 2 (two) times daily.    . hydroxychloroquine (PLAQUENIL) 200 MG tablet Take 200 mg by mouth daily.    . hydroxychloroquine (PLAQUENIL) 200 MG tablet Take by mouth.    . levothyroxine (SYNTHROID, LEVOTHROID) 50 MCG tablet Take 1 tablet (50 mcg total) by mouth daily. 30 tablet 1  . metoprolol succinate (TOPROL-XL) 100 MG 24 hr tablet Take 100 mg by mouth every morning. Take with or immediately following a meal.    . morphine (MS CONTIN) 15 MG 12 hr tablet Take 1 tablet (15 mg total) by mouth every 12 (twelve) hours. 60 tablet 0  . morphine (MSIR) 15 MG tablet Take 1 tablet (  15 mg total) by mouth every 8 (eight) hours as needed for severe pain. 90 tablet 0  . Multiple Vitamin (MULTIVITAMIN WITH MINERALS) TABS Take 1 tablet by mouth daily with lunch.     . olmesartan-hydrochlorothiazide (BENICAR HCT) 40-25 MG tablet Take 1 tablet by mouth daily.     . OXYGEN-HELIUM IN Inhale 2 L into the lungs at bedtime. And on exertion    . pantoprazole (PROTONIX) 40 MG tablet take 1 tablet by mouth once daily MINUTES BEFORE 1ST MEAL OF THE DAY 30 tablet 5  . prednisoLONE acetate (PRED FORTE) 1 % ophthalmic suspension Place 1 drop into both eyes every 4 hours.    . predniSONE (DELTASONE) 10 MG tablet Take 7.5 mg by mouth every morning. Alternating 42m and 7.555m 0  . sodium chloride (OCEAN) 0.65 % SOLN nasal spray Place 1 spray into both nostrils as needed for congestion.    . Marland Kitchenpacer/Aero-Holding Chambers (AEROCHAMBER MV) inhaler Use as instructed 1 each 0  . VICTOZA 18 MG/3ML  SOPN Inject 1.8 mg as directed daily.   0   No current facility-administered medications on file prior to visit.

## 2016-09-04 NOTE — Progress Notes (Addendum)
Subjective:    Patient ID: [name], female    DOB: 1940-11-18, 76 y.o.   MRN: [id number]  HPI: [name] is a 76 year old female who returns for follow up appointment for chronic painand medication refill. She states her pain is located in her lower back and bilateral knee pain. She ratesher pain 4. Her current exercise regime is performing chair exercises and walking.  [name] last UDS was on 04/01/2016, it was consistent. UDS ordered today.  Pain Inventory Average Pain 4 Pain Right Now 4 My pain is intermittent, sharp and stabbing  In the last 24 hours, has pain interfered with the following? General activity 7 Relation with others 7 Enjoyment of life 7 What TIME of day is your pain at its worst? morning Sleep (in general) Fair  Pain is worse with: walking, bending, standing and some activites Pain improves with: rest, heat/ice and medication Relief from Meds: 7  Mobility use a cane use a walker ability to climb steps?  no  Function retired I need assistance with the following:  meal prep, household duties and shopping  Neuro/Psych bladder control problems numbness  Prior Studies Any changes since last visit?  no  Physicians involved in your care Any changes since last visit?  no   Family History  Problem Relation Age of Onset  . Diabetes Mother   . Heart attack Mother   . Hypertension Father   . Lung cancer Father   . Diabetes Sister   . Diabetes Brother   . Hypertension Brother   . Heart disease Brother   . Heart attack Brother   . Kidney cancer Brother   . Uterine cancer Daughter   . Breast cancer Sister   . Rheum arthritis Maternal Uncle   . Gout Brother   . Kidney failure Brother     x 5   Social History   Social History  . Marital status: Married    Spouse name: N/A  . Number of children: 6  . Years of education: college   Occupational History  . retired Marine scientist    Social History Main Topics  . Smoking  status: Never Smoker  . Smokeless tobacco: Never Used  . Alcohol use No  . Drug use: No  . Sexual activity: Not Currently   Other Topics Concern  . None   Social History Narrative   Patient consumes 2-3 cups coffee per day.   Past Surgical History:  Procedure Laterality Date  . ABDOMINAL HYSTERECTOMY    . BRAIN SURGERY     Gamma knife 10/13. Needs repeat spring  '14  . CARDIAC CATHETERIZATION N/A 10/31/2014   Procedure: Right/Left Heart Cath and Coronary Angiography;  Surgeon: [name], MD;  Location: [facility];  Service: Cardiovascular;  Laterality: N/A;  . ESOPHAGOGASTRODUODENOSCOPY (EGD) WITH PROPOFOL N/A 09/14/2014   Procedure: ESOPHAGOGASTRODUODENOSCOPY (EGD) WITH PROPOFOL;  Surgeon: [name], MD;  Location: [facility];  Service: Endoscopy;  Laterality: N/A;  . OVARY SURGERY    . SHOULDER SURGERY Left   . TONSILLECTOMY  age 73  . VIDEO BRONCHOSCOPY Bilateral 05/31/2013   Procedure: VIDEO BRONCHOSCOPY WITHOUT FLUORO;  Surgeon: Brand Males, MD;  Location: [facility];  Service: Cardiopulmonary;  Laterality: Bilateral;  . video bronscoscopy  2000   lung   Past Medical History:  Diagnosis Date  . Arthritis    endstage changes bilateral knees/bilateral ankles.   . Asthma   . Carotid artery occlusion   .  Chronic kidney disease   . Clotting disorder ([place])    pt denies this  . Contusion of left knee    due to fall 1/14.  [name] COPD (chronic obstructive pulmonary disease) (HCC)    pulmonary fibrosis  . Depression, reactive   . Diabetes mellitus   . Diastolic dysfunction   . Family history of heart disease   . High cholesterol   . Hypertension   . Interstitial lung disease ([place])   . 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    . Morbid obesity ([place])   . Normal coronary arteries    cardiac catheterization performed  10/31/14  . RA (rheumatoid arthritis)  ([place])    has been off methotreaxte since 10/13.  [name] Shortness of breath   . Thyroid disease   . URI (upper respiratory infection)    BP 108/74   Pulse 88   SpO2 96%   Opioid Risk Score:   Fall Risk Score:  `1  Depression screen PHQ 2/9  Depression screen [facility] 2/9 09/04/2016 08/09/2015 01/05/2015 09/20/2014 08/10/2013  Decreased Interest 0 _0 -  Down, Depressed, Hopeless 0 _1 PHQ - 2 Score 0 _2 Altered sleeping - - 2 0 -  Tired, decreased energy - - 2 3 -  Change in appetite - - 2 3 -  Feeling bad or failure about yourself  - - 0 3 -  Trouble concentrating - - 1 1 -  Moving slowly or fidgety/restless - - 0 2 -  Suicidal thoughts - - 0 0 -  PHQ-9 Score - - 10 16 -  Difficult doing work/chores - - Somewhat difficult - -  Some recent data might be hidden   Review of Systems  Constitutional: Negative.   HENT: Negative.   Eyes: Negative.   Respiratory: Positive for cough, shortness of breath and wheezing.   Cardiovascular: Negative.   Gastrointestinal: Negative.   Endocrine:       High blood sugar  Genitourinary: Negative.   Musculoskeletal: Negative.   Skin: Negative.   Allergic/Immunologic: Negative.   Neurological: Positive for numbness.  Hematological: Negative.   Psychiatric/Behavioral: Negative.   All other systems reviewed and are negative.      Objective:   Physical Exam  Constitutional: She is oriented to person, place, and time. She appears well-developed and well-nourished.  HENT:  Head: Normocephalic and atraumatic.  Neck: Normal range of motion. Neck supple.  Cardiovascular: Normal rate and regular rhythm.   Pulmonary/Chest: Effort normal and breath sounds normal.  Musculoskeletal:  Normal Muscle Bulk and Muscle Testing Reveals: Upper Extremities: Full ROM and Muscle Strength 5/5 Lumbar Paraspinal Tenderness: L-3-L-5 Lower Extremities: Full ROM and Muscle Strength 5/5 Arises from chair slowly using walker for support Narrow Based Gait    Neurological: She is alert and oriented to person, place, and time.  Skin: Skin is warm and dry.  Psychiatric: She has a normal mood and affect.  Nursing note and vitals reviewed.         Assessment & Plan:  1. Lumbar spinal stenosis with neurogenic claudication. Associated facet arthropathy: Continue Gabapentinand HEP as tolerated. 09/04/2016 Refilled : MS Contin 15 mg one tablet every 12 hours #60 and continueMSIR 15 mg 1 one tablet every 8hours as needed #90.  No script given. We will continue the opioid monitoring program, this consists of regular clinic visits, examinations,  urine drug screen, pill counts as well as use of [place] Controlled Substance reporting System. 2. Depression: Continue Cymbalta. 09/04/2016 3. Diabetes mellitus type 2 with polyneuropathy: Continue Gabapentin. 09/04/2016 4. Rheumatoid arthritis and osteoarthritis. Contnue Voltaren Gel. Rheumatology Following. 09/04/2016 5. Interstitial lung disease: Pulmonology Following. 09/04/2016 6. Bilateral Osteoarthritis Knee's: Continue Voltaren Gel.09/04/2016 7. Muscle Spasm: Continue : Flexeril. 09/04/2016 8. Left Greater Trochanteric tenderness: No complaints today.Continue with Ice/Heat Therapy. 09/04/2016 9. Polyarthralgia: Rheumatology Following: Continue to monitor. 09/04/2016 10. Morbid Obesitity: Continue Healthy Diet Regime and HEP. 09/04/2016. 11. Muscle Spasm: Continue Flexeril  20 minutes of face to face patient care time was spent during this visit. All questions were encouraged and answered.   F/U in 1 month

## 2016-09-04 NOTE — Telephone Encounter (Signed)
ATC again and still NA

## 2016-09-04 NOTE — Telephone Encounter (Signed)
ATC, NA- line rang multiple times with no option to leave a msg, [facility]

## 2016-09-05 MED ORDER — PREDNISONE 20 MG PO TABS: 20.0000 mg | ORAL_TABLET | Freq: Every day | ORAL | 0 refills | Status: DC

## 2016-09-05 MED ORDER — DOXYCYCLINE HYCLATE 100 MG PO TABS: 100.0000 mg | ORAL_TABLET | Freq: Two times a day (BID) | ORAL | 0 refills | Status: DC

## 2016-09-05 NOTE — Telephone Encounter (Signed)
Patient returned phone call..ert

## 2016-09-05 NOTE — Telephone Encounter (Signed)
Pt aware of recs.  rx's sent to preferred pharmacy.  Nothing further needed.

## 2016-09-08 ENCOUNTER — Encounter: Payer: Medicare Other | Admitting: Registered Nurse

## 2016-09-09 LAB — 6-ACETYLMORPHINE,TOXASSURE ADD
6-ACETYLMORPHINE: NEGATIVE
6-acetylmorphine: NOT DETECTED ng/mg creat

## 2016-09-09 LAB — TOXASSURE SELECT,+ANTIDEPR,UR

## 2016-09-10 ENCOUNTER — Telehealth: Payer: Self-pay | Admitting: *Deleted

## 2016-09-10 DIAGNOSIS — H20013 Primary iridocyclitis, bilateral: Secondary | ICD-10-CM | POA: Diagnosis not present

## 2016-09-10 NOTE — Telephone Encounter (Signed)
Urine drug screen for this encounter is consistent for prescribed medication. Cyclobenzaprine is prn and has not been refilled since 06/2016.

## 2016-09-17 ENCOUNTER — Ambulatory Visit (INDEPENDENT_AMBULATORY_CARE_PROVIDER_SITE_OTHER): Payer: Medicare Other | Admitting: Rheumatology

## 2016-09-17 ENCOUNTER — Encounter: Payer: Self-pay | Admitting: Rheumatology

## 2016-09-17 VITALS — BP 138/82 | HR 88 | Resp 20 | Ht 64.0 in | Wt 280.0 lb

## 2016-09-17 DIAGNOSIS — M25562 Pain in left knee: Secondary | ICD-10-CM

## 2016-09-17 DIAGNOSIS — G8929 Other chronic pain: Secondary | ICD-10-CM | POA: Diagnosis not present

## 2016-09-17 DIAGNOSIS — M1A00X0 Idiopathic chronic gout, unspecified site, without tophus (tophi): Secondary | ICD-10-CM

## 2016-09-17 DIAGNOSIS — B0230 Zoster ocular disease, unspecified: Secondary | ICD-10-CM | POA: Diagnosis not present

## 2016-09-17 DIAGNOSIS — Z79899 Other long term (current) drug therapy: Secondary | ICD-10-CM

## 2016-09-17 DIAGNOSIS — M05741 Rheumatoid arthritis with rheumatoid factor of right hand without organ or systems involvement: Secondary | ICD-10-CM

## 2016-09-17 DIAGNOSIS — M05742 Rheumatoid arthritis with rheumatoid factor of left hand without organ or systems involvement: Secondary | ICD-10-CM

## 2016-09-17 LAB — CBC WITH DIFFERENTIAL/PLATELET
BASOS ABS: 0 {cells}/uL (ref 0–200)
Basophils Relative: 0 %
Eosinophils Absolute: 276 cells/uL (ref 15–500)
Eosinophils Relative: 4 %
HEMATOCRIT: 38.6 % (ref 35.0–45.0)
HEMOGLOBIN: 12.1 g/dL (ref 11.7–15.5)
LYMPHS ABS: 1587 {cells}/uL (ref 850–3900)
Lymphocytes Relative: 23 %
MCH: 28.1 pg (ref 27.0–33.0)
MCHC: 31.3 g/dL — ABNORMAL LOW (ref 32.0–36.0)
MCV: 89.6 fL (ref 80.0–100.0)
MONO ABS: 414 {cells}/uL (ref 200–950)
MPV: 10.4 fL (ref 7.5–12.5)
Monocytes Relative: 6 %
NEUTROS ABS: 4623 {cells}/uL (ref 1500–7800)
Neutrophils Relative %: 67 %
Platelets: 230 10*3/uL (ref 140–400)
RBC: 4.31 MIL/uL (ref 3.80–5.10)
RDW: 17.7 % — ABNORMAL HIGH (ref 11.0–15.0)
WBC: 6.9 10*3/uL (ref 3.8–10.8)

## 2016-09-17 IMAGING — RF DG SWALLOWING FUNCTION
1 series · 1 of 1 positions shown · non-contrast
Comparison: Chest CT 07/20/2014.

CLINICAL DATA: Dysphagia. Requiring Mulisis maneuver x2 after
swallowing pills. Initial encounter.

EXAM:
MODIFIED BARIUM SWALLOW
TECHNIQUE: Different consistencies of barium were administered orally to the
patient by the Speech Pathologist. Imaging of the pharynx was
performed in the lateral projection.
FLUOROSCOPY TIME:  Radiation Exposure Index (as provided by the
fluoroscopic device):
If the device does not provide the exposure index:
Fluoroscopy Time:  1 minutes and 49 seconds.
Number of Acquired Images:

[Series 1: run · 1 of 1 slices shown]
[im 1/1]
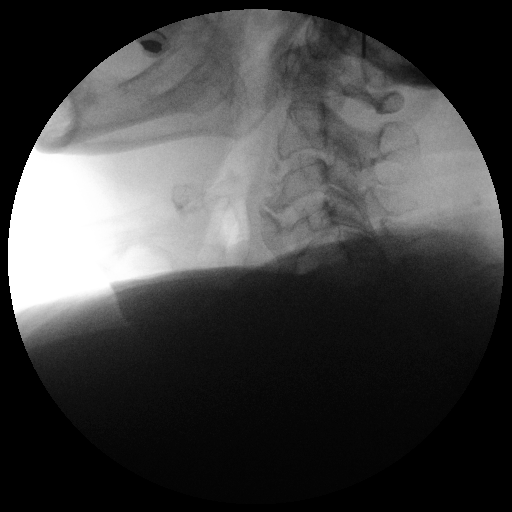

[1 of 1 positions shown; findings below may reference images not displayed]

FINDINGS: Thin liquid- flash penetration with large boluses. No aspiration or
other significant abnormality.

Nectar thick liquid- single episode of flash penetration. No
aspiration or other significant abnormality.

Honey- not administered

Puree- mild pooling in the valleculae and piriform sinuses. No
penetration or aspiration.

Puree with cracker- mild pooling in the valleculae and piriform
sinuses. No penetration or aspiration.

Barium tablet -  taken with thin barium, this passed without delay.

Of note, the patient has prominent anterior osteophytes in the lower
cervical spine consistent with diffuse idiopathic skeletal
hyperostosis. This may contribute to extrinsic mass effect on the
proximal esophagus.
IMPRESSION: 1. Flash penetration with thin and nectar thick barium. No
aspiration.
2. Mild pooling in the valleculae and piriform sinuses with solids.
3. Diffuse idiopathic skeletal hyperostosis.

Please refer to the Speech Pathologists report for complete details
and recommendations.

## 2016-09-17 MED ORDER — TRIAMCINOLONE ACETONIDE 40 MG/ML IJ SUSP
40.0000 mg | INTRAMUSCULAR | Status: AC | PRN
  Administered 2016-09-17: 40 mg via INTRA_ARTICULAR

## 2016-09-17 MED ORDER — LIDOCAINE HCL 1 % IJ SOLN
2.0000 mL | INTRAMUSCULAR | Status: AC | PRN
  Administered 2016-09-17: 2 mL

## 2016-09-17 NOTE — Progress Notes (Signed)
 Office Visit Note  Patient: [name]             Date of Birth: 09/17/1940           MRN: [id number]             PCP: Robyn N Sanders, MD Referring: Sanders, Robyn, MD Visit Date: 09/17/2016 Occupation: @GUAROCC@    Subjective:  Pain of the Lower Back; Pain of the Right Hip; Pain of the Left Hip; and Other (has had occular herpes/ shingles )   History of Present Illness: [name] is a 75 y.o. female   Last seen 04/14/2016 for rheumatoid arthritis.  At the last visit, patient was on  Plaquenil 200 mg twice a day And allopurinol 300 mg daily At the last visit, She was also on prednisone: 5 mg one day and then alternates it with 7.5 mg the next day. We had discussed patient decreasing the prednisone gradually to 5 mg daily as she can tolerate.  On the last visit, patient also struck the corner of car door to her left lower leg on August 2017. She was seeing wound care doctors for this. That wound also had become infected (possibly because she was a diabetic)  At the last visit, we injected her left knee with 40 mg of Kenalog mixed with one half mL's 1% lidocaine   On today's visit ==> Pt c/o eye issues about 6 weeks ago. She saw [facility] specialist. Retina specialist had a work up done for her eye issues. Her symptoms included pain in eyes, burning, light bothering patient and poor vision. After appropriate tests, patient states that the eye doctor told her that it could be early shingles trying to affect the eye bilaterally. She was put on drops for the eyes and she is doing better. She has follow-up appointment in June 2018. She was also advised to go sooner if something were to change or get worse.  Pt c/o of right low back pain.; left knee hurts (xray of knee was normal) --> diagnosed with deep venous issue (not diagnosed w/ clot).  Patient also complaining of left knee pain and is requesting cortisone injection left knee for appropriate. She  states that her diabetes is being well controlled currently. She states that her hemoglobin A1c recently was 6.4  Activities of Daily Living:  Patient reports morning stiffness for 30 minutes.   Patient Reports nocturnal pain.  Difficulty dressing/grooming: Reports Difficulty climbing stairs: Reports Difficulty getting out of chair: Reports Difficulty using hands for taps, buttons, cutlery, and/or writing: Reports   Review of Systems  Constitutional: Negative for fatigue.  HENT: Negative for mouth sores and mouth dryness.   Eyes: Negative for dryness.  Respiratory: Negative for shortness of breath.   Gastrointestinal: Negative for constipation and diarrhea.  Musculoskeletal: Negative for myalgias and myalgias.  Skin: Negative for sensitivity to sunlight.  Psychiatric/Behavioral: Negative for decreased concentration and sleep disturbance.    PMFS History:  Patient Active Problem List   Diagnosis Date Noted  . Tendinitis of left rotator cuff 06/13/2015  . Acute on chronic diastolic heart failure (HCC) 04/21/2015  . Acute pulmonary edema (HCC)   . AKI (acute kidney injury) (HCC)   . Peripheral edema   . Acute on chronic respiratory failure with hypoxia (HCC) 04/17/2015  . CAP (community acquired pneumonia) 04/17/2015  . Acute kidney injury superimposed on chronic kidney disease (HCC) 04/17/2015  . Hypotension 04/17/2015  . Chronic respiratory failure with hypoxia (  [place]) 02/09/2015  . Dyspnea and respiratory abnormality 01/03/2015  . Normal coronary arteries 11/02/2014  . Morbid obesity ([place]) 10/30/2014  . Dysphagia 08/21/2014  . Sore throat 07/18/2014  . Chronic fatigue 06/21/2014  . DOE (dyspnea on exertion) 05/24/2014  . Primary osteoarthritis of left knee 04/26/2014  . High risk medication use 04/17/2014  . Aphasia 02/12/2014  . 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 ([place]) 01/18/2014  . Lumbar facet arthropathy ([place]) 01/18/2014  . Acute bronchitis 07/24/2013    . Bilateral edema of lower extremity 05/30/2013  . Chronic cough 05/24/2013  . ILD (interstitial lung disease) ([place]) 04/10/2013  . Greater trochanteric bursitis of left hip 08/13/2012  . Spinal stenosis of lumbar region with radiculopathy 07/12/2012  . Inability to walk 07/12/2012  . Acute lumbar back pain 07/12/2012  . Asymptomatic bacteriuria 07/12/2012  . 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 Benign neoplasm of meninges ([place]) 02/12/2012  . Brain mass 01/28/2012  . Chest pain with moderate risk of acute coronary syndrome 01/28/2012  . Rheumatoid arthritis ([place]) 01/28/2012  . Primary osteoarthritis of right knee 01/28/2012  . Dyslipidemia   . Thyroid disease   . Essential hypertension     Past Medical History:  Diagnosis Date  . Arthritis    endstage changes bilateral knees/bilateral ankles.   . Asthma   . Carotid artery occlusion   . Chronic kidney disease   . Clotting disorder ([place])    pt denies this  . Contusion of left knee    due to fall 1/14.  [name] COPD (chronic obstructive pulmonary disease) (HCC)    pulmonary fibrosis  . Depression, reactive   . Diabetes mellitus   . Diastolic dysfunction   . Family history of heart disease   . High cholesterol   . Hypertension   . Interstitial lung disease ([place])   . 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    . Morbid obesity ([place])   . Normal coronary arteries    cardiac catheterization performed  10/31/14  . RA (rheumatoid arthritis) ([place])    has been off methotreaxte since 10/13.  [name] Shortness of breath   . Thyroid disease   . URI (upper respiratory infection)     Family History  Problem Relation Age of Onset  . Diabetes Mother   . Heart attack Mother   . Hypertension Father   . Lung cancer Father   . Diabetes Sister   . Diabetes Brother   . Hypertension Brother   . Heart disease Brother   .  Heart attack Brother   . Kidney cancer Brother   . Uterine cancer Daughter   . Breast cancer Sister   . Rheum arthritis Maternal Uncle   . Gout Brother   . Kidney failure Brother     x 5   Past Surgical History:  Procedure Laterality Date  . ABDOMINAL HYSTERECTOMY    . BRAIN SURGERY     Gamma knife 10/13. Needs repeat spring  '14  . CARDIAC CATHETERIZATION N/A 10/31/2014   Procedure: Right/Left Heart Cath and Coronary Angiography;  Surgeon: [name], MD;  Location: [facility];  Service: Cardiovascular;  Laterality: N/A;  . ESOPHAGOGASTRODUODENOSCOPY (EGD) WITH PROPOFOL N/A 09/14/2014   Procedure: ESOPHAGOGASTRODUODENOSCOPY (EGD) WITH PROPOFOL;  Surgeon: [name], MD;  Location: [facility];  Service: Endoscopy;  Laterality: N/A;  . OVARY SURGERY    .  SHOULDER SURGERY Left   . TONSILLECTOMY  age 19  . VIDEO BRONCHOSCOPY Bilateral 05/31/2013   Procedure: VIDEO BRONCHOSCOPY WITHOUT FLUORO;  Surgeon: Murali Ramaswamy, MD;  Location: [facility];  Service: Cardiopulmonary;  Laterality: Bilateral;  . video bronscoscopy  2000   lung   Social History   Social History Narrative   Patient consumes 2-3 cups coffee per day.     Objective: Vital Signs: BP 138/82   Pulse 88   Resp 20   Ht 5' 4" (1.626 m)   Wt 280 lb (127 kg)   BMI 48.06 kg/m    Physical Exam  Constitutional: She is oriented to person, place, and time. She appears well-developed and well-nourished.  HENT:  Head: Normocephalic and atraumatic.  Eyes: EOM are normal. Pupils are equal, round, and reactive to light.  Cardiovascular: Normal rate, regular rhythm and normal heart sounds.  Exam reveals no gallop and no friction rub.   No murmur heard. Pulmonary/Chest: Effort normal and breath sounds normal. She has no wheezes. She has no rales.  Abdominal: Soft. Bowel sounds are normal. She exhibits no distension. There is no tenderness. There is no guarding. No hernia.  Musculoskeletal: Normal  range of motion. She exhibits no edema, tenderness or deformity.  Lymphadenopathy:    She has no cervical adenopathy.  Neurological: She is alert and oriented to person, place, and time. Coordination normal.  Skin: Skin is warm and dry. Capillary refill takes less than 2 seconds. No rash noted.  Psychiatric: She has a normal mood and affect. Her behavior is normal.  Nursing note and vitals reviewed.    Musculoskeletal Exam:  Full range of motion of all joints Grip strength is equal and strong bilaterally For myalgia tender points are absent  CDAI Exam: CDAI Homunculus Exam:   Tenderness:  LLE: tibiofemoral  Joint Counts:  CDAI Tender Joint count: 1 CDAI Swollen Joint count: 0  Global Assessments:  Patient Global Assessment: 8 Provider Global Assessment: 8  CDAI Calculated Score: 17   Patient has no synovitis on wrist or MCP joints bilaterally. She does have some bilateral knee pain with left worse than right. She has had her prednisone increased to 10 mg at [facility] (about 2 months ago) Patient has tried in the past to go down on her prednisone but she is not able to tolerate the lower dose. At one time she did try 7.5 mg and then alternate with 5 mg but patient flared and when she saw [facility] they gave her taper and then finally brought her down to 10 mg daily. She wants to stay on 10 mg daily is all right   Investigation: No additional findings.  Office Visit on 09/04/2016  Component Date Value Ref Range Status  . Summary 09/04/2016 FINAL   Final   Comment: ==================================================================== 6-Acetylmorphine,ToxAssure Add TOXASSURE SELECT,+ANTIDEPR,UR ==================================================================== Test                             Result       Flag       Units Drug Present and Declared for Prescription Verification   Morphine                       9661         EXPECTED   ng/mg creat     Potential sources of large amounts of morphine in the absence of    codeine include administration   of morphine or use of heroin.   Duloxetine                     PRESENT      EXPECTED Drug Absent but Declared for Prescription Verification   Cyclobenzaprine                Not Detected UNEXPECTED ==================================================================== Test                      Result    Flag   Units      Ref Range   Creatinine              71               mg/dL      >=20 ==================================================================== Declared Medications:  The flagging                           and interpretation on this report are based on the  following declared medications.  Unexpected results may arise from  inaccuracies in the declared medications.  **Note: The testing scope of this panel includes these medications:  Cyclobenzaprine (Flexeril)  Duloxetine (Cymbalta)  Morphine (MS Contin)  Morphine (MSIR)  **Note: The testing scope of this panel does not include following  reported medications:  Acetaminophen (Tylenol)  Albuterol (Proventil)  Allopurinol (Zyloprim)  Aspirin (Aspirin 81)  Atorvastatin (Lipitor)  Azithromycin (Zithromax)  Calcium Carbonate (Calcium Carb/Vitamin D)  Diclofenac (Voltaren)  Fluticasone (Flonase)  Furosemide (Lasix)  Gabapentin (Neurontin)  Guaifenesin (Mucinex)  Hydrochlorothiazide (HCTZ)  Hydroxychloroquine (Plaquenil)  Levothyroxine (Synthroid)  Liraglutide (Victoza)  Metoprolol (Toprol)  Multivitamin (MVI)  Olmesartan (Benicar)  Pantoprazole (Protonix)  Prednisone (Deltasone)  Saline  Vitamin D  Vitamin D (C                          alcium Carb/Vitamin D) ==================================================================== For clinical consultation, please call [phone]. ====================================================================   . 6-ACETYLMORPHINE 09/04/2016 Negative   Final  . 6-acetylmorphine  09/04/2016 Not Detected  ng/mg creat Final   Comment:          For clinical consultation, please call                    [phone].   Office Visit on 04/14/2016  Component Date Value Ref Range Status  . WBC 04/14/2016 7.6  3.8 - 10.8 K/uL Final  . RBC 04/14/2016 3.87  3.80 - 5.10 MIL/uL Final  . Hemoglobin 04/14/2016 10.8* 11.7 - 15.5 g/dL Final  . HCT 04/14/2016 34.6* 35.0 - 45.0 % Final  . MCV 04/14/2016 89.4  80.0 - 100.0 fL Final  . MCH 04/14/2016 27.9  27.0 - 33.0 pg Final  . MCHC 04/14/2016 31.2* 32.0 - 36.0 g/dL Final  . RDW 04/14/2016 16.3* 11.0 - 15.0 % Final  . Platelets 04/14/2016 224  140 - 400 K/uL Final  . MPV 04/14/2016 10.1  7.5 - 12.5 fL Final  . Neutro Abs 04/14/2016 5472  1,500 - 7,800 cells/uL Final  . Lymphs Abs 04/14/2016 1368  850 - 3,900 cells/uL Final  . Monocytes Absolute 04/14/2016 532  200 - 950 cells/uL Final  . Eosinophils Absolute 04/14/2016 228  15 - 500 cells/uL Final  . Basophils Absolute 04/14/2016 0  0 - 200 cells/uL Final  . Neutrophils Relative % 04/14/2016 72  % Final  . Lymphocytes Relative 04/14/2016   18  % Final  . Monocytes Relative 04/14/2016 7  % Final  . Eosinophils Relative 04/14/2016 3  % Final  . Basophils Relative 04/14/2016 0  % Final  . Smear Review 04/14/2016 Criteria for review not met   Final  . Sodium 04/14/2016 142  135 - 146 mmol/L Final  . Potassium 04/14/2016 4.2  3.5 - 5.3 mmol/L Final  . Chloride 04/14/2016 102  98 - 110 mmol/L Final  . CO2 04/14/2016 32* 20 - 31 mmol/L Final  . Glucose, Bld 04/14/2016 125* 65 - 99 mg/dL Final  . BUN 04/14/2016 20  7 - 25 mg/dL Final  . Creat 04/14/2016 1.25* 0.60 - 0.93 mg/dL Final   Comment:   For patients > or = 75 years of age: The upper reference limit for Creatinine is approximately 13% higher for people identified as African-American.     . Total Bilirubin 04/14/2016 0.4  0.2 - 1.2 mg/dL Final  . Alkaline Phosphatase 04/14/2016 72  33 - 130 U/L Final  . AST 04/14/2016  17  10 - 35 U/L Final  . ALT 04/14/2016 19  6 - 29 U/L Final  . Total Protein 04/14/2016 6.1  6.1 - 8.1 g/dL Final  . Albumin 04/14/2016 3.6  3.6 - 5.1 g/dL Final  . Calcium 04/14/2016 8.4* 8.6 - 10.4 mg/dL Final  . GFR, Est African American 04/14/2016 49* >=60 mL/min Final  . GFR, Est Non African American 04/14/2016 42* >=60 mL/min Final  . Uric Acid, Serum 04/14/2016 4.8  2.5 - 7.0 mg/dL Final  . Vit D, 25-Hydroxy 04/14/2016 31  30 - 100 ng/mL Final   Comment: Vitamin D Status           25-OH Vitamin D        Deficiency                <20 ng/mL        Insufficiency         20 - 29 ng/mL        Optimal             > or = 30 ng/mL   For 25-OH Vitamin D testing on patients on D2-supplementation and patients for whom quantitation of D2 and D3 fractions is required, the QuestAssureD 25-OH VIT D, (D2,D3), LC/MS/MS is recommended: order code 85814 (patients > 2 yrs).   Office Visit on 04/01/2016  Component Date Value Ref Range Status  . ToxASSURE Select (Plus) 04/01/2016 FINAL   Final   Comment: ==================================================================== 6-Acetylmorphine,ToxAssure Add TOXASSURE SELECT,+ANTIDEPR,UR ==================================================================== Test                             Result       Flag       Units Drug Present and Declared for Prescription Verification   Morphine                       >7813        EXPECTED   ng/mg creat    Potential sources of large amounts of morphine in the absence of    codeine include administration of morphine or use of heroin. ==================================================================== Test                      Result    Flag   Units      Ref Range   Creatinine                128              mg/dL      >=20 ==================================================================== Declared Medications:  The flagging and interpretation on this report are based on the  following declared medications.   Unexpected results may arise from  inaccuracies in the declared medications.  **No                          te: The testing scope of this panel includes these medications:  Morphine ==================================================================== For clinical consultation, please call [phone]. ====================================================================   . 6-ACETYLMORPHINE 04/01/2016 Negative   Final  . 6-acetylmorphine 04/01/2016 Not Detected  ng/mg creat Final   Comment:          For clinical consultation, please call                    [phone].     Imaging: No results found.  Speciality Comments: No specialty comments available.    Procedures:  Large Joint Inj Date/Time: 09/17/2016 2:28 PM Performed by: ,  Authorized by: ,    Consent Given by:  Patient Site marked: the procedure site was marked   Timeout: prior to procedure the correct patient, procedure, and site was verified   Indications:  Pain and joint swelling Location:  Knee Site:  L knee Prep: patient was prepped and draped in usual sterile fashion   Needle Size:  27 G Needle Length:  1.5 inches Approach:  Medial Ultrasound Guidance: No   Fluoroscopic Guidance: No   Arthrogram: No   Medications:  2 mL lidocaine 1 %; 40 mg triamcinolone acetonide 40 MG/ML Aspiration Attempted: Yes   Patient tolerance:  Patient tolerated the procedure well with no immediate complications  Left knee injected today. Patient's diabetes is well controlled. Her recent hemoglobin A1c was 6.4. Patient rated her pain to the left knee as 8 on a scale of 0-10. She is also complaining of low back pain and I suspect that some of that he strained coming from the left knee.    Allergies: Codeine; Remicade [infliximab]; and Zestril [lisinopril]   Assessment / Plan:     Visit Diagnoses: Rheumatoid arthritis involving both hands with positive rheumatoid factor (HCC) - 09/17/2016:  Positive CCP, positive rheumatoid factor.  Idiopathic chronic gout without tophus, unspecified site  High risk medications (not anticoagulants) long-term use - Plaquenil 200 mg twice a day; prednisone long term 5mg alternate 7.5mg - Plan: CBC with Differential/Platelet, COMPLETE METABOLIC PANEL WITH GFR  Herpes zoster with ophthalmic complication, unspecified herpes zoster eye disease - per patient; saw retina   Plan: #1: Rheumatoid arthritis with positive rheumatoid factor; positive CCP. Patient is on Plaquenil 200 mg twice a day Plaquenil eye exam was done 1 month ago in [place] and was normal  Currently patient has moved up on her prednisone. Patient has no synovitis on wrist or MCP joints bilaterally. She does have some bilateral knee pain with left worse than right. She has had her prednisone increased to 10 mg at [facility] (about 2 months ago) Patient has tried in the past to go down on her prednisone but she is not able to tolerate the lower dose. At one time she did try 7.5 mg and then alternate with 5 mg but patient flared and when she saw [facility] they gave her taper and then finally brought her down to 10 mg daily. She wants to stay on 10 mg   daily is all right   #2: High risk prescription. Plaquenil 200 mg twice a day Plaquenil eye exam normal from a month ago. Patient will bring in documentation for the normal Plaquenil eye exam. She had it done in [place]  #3: History of gout. No flare. Taking allopurinol 300 mg daily no flare.  #4: Complaining of lower back pain especially on the left side as well as some left upper thigh pain.  #5: Bilateral knee pain with left worse than right. We have tried [place] supplementation for patient in the past but it was not effective and we decided to discontinue. Patient states that her pain is 8 on a scale of 0-10 today and she would like to get a cortisone injection in her left knee. Her  diabetes is fairly well controlled according to the patient and she states that despite getting a cortisone injection she'll be able to take care of her sugars. Patient states that her hemoglobin A1c was 6.4 recently.  #6: Intentional weight loss On November 2017 visit, patient wasWt 292 lb  On today's visit Asian is 280 pounds  #7: Patient states that she has adequate supply of allopurinol, Plaquenil at home. She will call us if she needs a refill.  #8: CBC with differential, CMP with GFR to be done today  Orders: Orders Placed This Encounter  Procedures  . Large Joint Injection/Arthrocentesis  . CBC with Differential/Platelet  . COMPLETE METABOLIC PANEL WITH GFR   No orders of the defined types were placed in this encounter.   Face-to-face time spent with patient was 30 minutes. 50% of time was spent in counseling and coordination of care.  Follow-Up Instructions: Return in about 3 months (around 12/18/2016).    , PA-C  Note - This record has been created using Dragon software.  Chart creation errors have been sought, but may not always  have been located. Such creation errors do not reflect on  the standard of medical care. 

## 2016-09-18 LAB — COMPLETE METABOLIC PANEL WITH GFR
ALT: 27 U/L (ref 6–29)
AST: 19 U/L (ref 10–35)
Albumin: 3.6 g/dL (ref 3.6–5.1)
Alkaline Phosphatase: 74 U/L (ref 33–130)
BILIRUBIN TOTAL: 0.6 mg/dL (ref 0.2–1.2)
BUN: 34 mg/dL — AB (ref 7–25)
CO2: 23 mmol/L (ref 20–31)
Calcium: 8.8 mg/dL (ref 8.6–10.4)
Chloride: 99 mmol/L (ref 98–110)
Creat: 1.57 mg/dL — ABNORMAL HIGH (ref 0.60–0.93)
GFR, EST NON AFRICAN AMERICAN: 32 mL/min — AB (ref >=60)
GFR, Est African American: 37 mL/min — ABNORMAL LOW (ref >=60)
GLUCOSE: 174 mg/dL — AB (ref 65–99)
Potassium: 3.8 mmol/L (ref 3.5–5.3)
SODIUM: 141 mmol/L (ref 135–146)
TOTAL PROTEIN: 6.3 g/dL (ref 6.1–8.1)

## 2016-09-24 DIAGNOSIS — N08 Glomerular disorders in diseases classified elsewhere: Secondary | ICD-10-CM | POA: Diagnosis not present

## 2016-09-24 DIAGNOSIS — E1122 Type 2 diabetes mellitus with diabetic chronic kidney disease: Secondary | ICD-10-CM | POA: Diagnosis not present

## 2016-09-24 DIAGNOSIS — N183 Chronic kidney disease, stage 3 (moderate): Secondary | ICD-10-CM | POA: Diagnosis not present

## 2016-09-24 DIAGNOSIS — R269 Unspecified abnormalities of gait and mobility: Secondary | ICD-10-CM | POA: Diagnosis not present

## 2016-09-24 DIAGNOSIS — J8410 Pulmonary fibrosis, unspecified: Secondary | ICD-10-CM | POA: Diagnosis not present

## 2016-09-24 DIAGNOSIS — E039 Hypothyroidism, unspecified: Secondary | ICD-10-CM | POA: Diagnosis not present

## 2016-10-14 ENCOUNTER — Telehealth: Payer: Self-pay | Admitting: Registered Nurse

## 2016-10-14 NOTE — Telephone Encounter (Signed)
On 10/14/2016 the [place] was reviewed no conflict was seen on the [place] with multiple prescribers. [name] has a signed narcotic contract with our office. If there were any discrepancies this would have been reported to her physician.

## 2016-10-15 ENCOUNTER — Encounter: Payer: Medicare Other | Attending: Physical Medicine & Rehabilitation | Admitting: Registered Nurse

## 2016-10-15 ENCOUNTER — Encounter: Payer: Self-pay | Admitting: Registered Nurse

## 2016-10-15 VITALS — BP 125/81 | HR 90 | Resp 14

## 2016-10-15 DIAGNOSIS — Z8249 Family history of ischemic heart disease and other diseases of the circulatory system: Secondary | ICD-10-CM | POA: Insufficient documentation

## 2016-10-15 DIAGNOSIS — E1140 Type 2 diabetes mellitus with diabetic neuropathy, unspecified: Secondary | ICD-10-CM

## 2016-10-15 DIAGNOSIS — M1711 Unilateral primary osteoarthritis, right knee: Secondary | ICD-10-CM | POA: Diagnosis not present

## 2016-10-15 DIAGNOSIS — E7800 Pure hypercholesterolemia, unspecified: Secondary | ICD-10-CM | POA: Insufficient documentation

## 2016-10-15 DIAGNOSIS — M170 Bilateral primary osteoarthritis of knee: Secondary | ICD-10-CM | POA: Diagnosis not present

## 2016-10-15 DIAGNOSIS — Z5181 Encounter for therapeutic drug level monitoring: Secondary | ICD-10-CM

## 2016-10-15 DIAGNOSIS — E1142 Type 2 diabetes mellitus with diabetic polyneuropathy: Secondary | ICD-10-CM | POA: Diagnosis not present

## 2016-10-15 DIAGNOSIS — N189 Chronic kidney disease, unspecified: Secondary | ICD-10-CM | POA: Diagnosis not present

## 2016-10-15 DIAGNOSIS — Z833 Family history of diabetes mellitus: Secondary | ICD-10-CM | POA: Insufficient documentation

## 2016-10-15 DIAGNOSIS — I129 Hypertensive chronic kidney disease with stage 1 through stage 4 chronic kidney disease, or unspecified chronic kidney disease: Secondary | ICD-10-CM | POA: Insufficient documentation

## 2016-10-15 DIAGNOSIS — Z801 Family history of malignant neoplasm of trachea, bronchus and lung: Secondary | ICD-10-CM | POA: Insufficient documentation

## 2016-10-15 DIAGNOSIS — R200 Anesthesia of skin: Secondary | ICD-10-CM | POA: Diagnosis not present

## 2016-10-15 DIAGNOSIS — M545 Low back pain: Secondary | ICD-10-CM | POA: Diagnosis not present

## 2016-10-15 DIAGNOSIS — M48062 Spinal stenosis, lumbar region with neurogenic claudication: Secondary | ICD-10-CM | POA: Diagnosis not present

## 2016-10-15 DIAGNOSIS — M1712 Unilateral primary osteoarthritis, left knee: Secondary | ICD-10-CM

## 2016-10-15 DIAGNOSIS — Z9889 Other specified postprocedural states: Secondary | ICD-10-CM | POA: Diagnosis not present

## 2016-10-15 DIAGNOSIS — F329 Major depressive disorder, single episode, unspecified: Secondary | ICD-10-CM | POA: Diagnosis not present

## 2016-10-15 DIAGNOSIS — M069 Rheumatoid arthritis, unspecified: Secondary | ICD-10-CM | POA: Diagnosis not present

## 2016-10-15 DIAGNOSIS — G894 Chronic pain syndrome: Secondary | ICD-10-CM | POA: Diagnosis not present

## 2016-10-15 DIAGNOSIS — J449 Chronic obstructive pulmonary disease, unspecified: Secondary | ICD-10-CM | POA: Diagnosis not present

## 2016-10-15 DIAGNOSIS — M4696 Unspecified inflammatory spondylopathy, lumbar region: Secondary | ICD-10-CM | POA: Diagnosis not present

## 2016-10-15 DIAGNOSIS — Z808 Family history of malignant neoplasm of other organs or systems: Secondary | ICD-10-CM | POA: Diagnosis not present

## 2016-10-15 DIAGNOSIS — M05721 Rheumatoid arthritis with rheumatoid factor of right elbow without organ or systems involvement: Secondary | ICD-10-CM | POA: Diagnosis not present

## 2016-10-15 DIAGNOSIS — Z8051 Family history of malignant neoplasm of kidney: Secondary | ICD-10-CM | POA: Diagnosis not present

## 2016-10-15 DIAGNOSIS — D689 Coagulation defect, unspecified: Secondary | ICD-10-CM | POA: Diagnosis not present

## 2016-10-15 DIAGNOSIS — E1122 Type 2 diabetes mellitus with diabetic chronic kidney disease: Secondary | ICD-10-CM | POA: Diagnosis not present

## 2016-10-15 DIAGNOSIS — M2550 Pain in unspecified joint: Secondary | ICD-10-CM

## 2016-10-15 DIAGNOSIS — M47816 Spondylosis without myelopathy or radiculopathy, lumbar region: Secondary | ICD-10-CM

## 2016-10-15 DIAGNOSIS — Z79899 Other long term (current) drug therapy: Secondary | ICD-10-CM | POA: Diagnosis not present

## 2016-10-15 DIAGNOSIS — E079 Disorder of thyroid, unspecified: Secondary | ICD-10-CM | POA: Insufficient documentation

## 2016-10-15 DIAGNOSIS — M7062 Trochanteric bursitis, left hip: Secondary | ICD-10-CM | POA: Diagnosis not present

## 2016-10-15 DIAGNOSIS — J8410 Pulmonary fibrosis, unspecified: Secondary | ICD-10-CM | POA: Insufficient documentation

## 2016-10-15 DIAGNOSIS — M62838 Other muscle spasm: Secondary | ICD-10-CM

## 2016-10-15 DIAGNOSIS — Z803 Family history of malignant neoplasm of breast: Secondary | ICD-10-CM | POA: Diagnosis not present

## 2016-10-15 MED ORDER — MORPHINE SULFATE ER 15 MG PO TBCR: 15.0000 mg | EXTENDED_RELEASE_TABLET | Freq: Two times a day (BID) | ORAL | 0 refills | Status: DC

## 2016-10-15 NOTE — Progress Notes (Signed)
Subjective:    Patient ID: [name], female    DOB: 1941-03-20, 76 y.o.   MRN: [id number]  HPI:  [name] is a 76 year old female who returns for follow up appointment for chronic painand medication refill. She states her pain is located in her mid-lower back, bilateral knee pain and left lower extremity.She ratesher pain 6. Her current exercise regime is performing chair exercises and walking short distances with walker.  [name] will be referred for Physical Therapy Evaluation for Mobility Assessment.  [name] last UDS was on 09/04/2016, it was consistent.   Pain Inventory Average Pain 6 Pain Right Now 6 My pain is intermittent, burning, dull and stabbing  In the last 24 hours, has pain interfered with the following? General activity 6 Relation with others 6 Enjoyment of life 6 What TIME of day is your pain at its worst? morning Sleep (in general) Fair  Pain is worse with: walking, bending and standing Pain improves with: rest, heat/ice, pacing activities and medication Relief from Meds: 6  Mobility use a cane use a walker ability to climb steps?  no do you drive?  no use a wheelchair  Function retired  Neuro/Psych bladder control problems numbness tingling trouble walking  Prior Studies Any changes since last visit?  no  Physicians involved in your care Any changes since last visit?  no   Family History  Problem Relation Age of Onset  . Diabetes Mother   . Heart attack Mother   . Hypertension Father   . Lung cancer Father   . Diabetes Sister   . Diabetes Brother   . Hypertension Brother   . Heart disease Brother   . Heart attack Brother   . Kidney cancer Brother   . Uterine cancer Daughter   . Breast cancer Sister   . Rheum arthritis Maternal Uncle   . Gout Brother   . Kidney failure Brother        x 5   Social History   Social History  . Marital status: Married    Spouse name: N/A  . Number of children: 6    . Years of education: college   Occupational History  . retired Marine scientist    Social History Main Topics  . Smoking status: Never Smoker  . Smokeless tobacco: Never Used  . Alcohol use No  . Drug use: No  . Sexual activity: Not Currently   Other Topics Concern  . None   Social History Narrative   Patient consumes 2-3 cups coffee per day.   Past Surgical History:  Procedure Laterality Date  . ABDOMINAL HYSTERECTOMY    . BRAIN SURGERY     Gamma knife 10/13. Needs repeat spring  '14  . CARDIAC CATHETERIZATION N/A 10/31/2014   Procedure: Right/Left Heart Cath and Coronary Angiography;  Surgeon: [name], MD;  Location: [facility];  Service: Cardiovascular;  Laterality: N/A;  . ESOPHAGOGASTRODUODENOSCOPY (EGD) WITH PROPOFOL N/A 09/14/2014   Procedure: ESOPHAGOGASTRODUODENOSCOPY (EGD) WITH PROPOFOL;  Surgeon: [name], MD;  [facility];  Service: Endoscopy;  Laterality: N/A;  . OVARY SURGERY    . SHOULDER SURGERY Left   . TONSILLECTOMY  age 53  . VIDEO BRONCHOSCOPY Bilateral 05/31/2013   Procedure: VIDEO BRONCHOSCOPY WITHOUT FLUORO;  Surgeon: Brand Males, MD;  Location: [place];  Service: Cardiopulmonary;  Laterality: Bilateral;  . video bronscoscopy  2000   lung   Past Medical History:  Diagnosis Date  .  Arthritis    endstage changes bilateral knees/bilateral ankles.   . Asthma   . Carotid artery occlusion   . Chronic kidney disease   . Clotting disorder ([place])    pt denies this  . Contusion of left knee    due to fall 1/14.  [name] COPD (chronic obstructive pulmonary disease) (HCC)    pulmonary fibrosis  . Depression, reactive   . Diabetes mellitus   . Diastolic dysfunction   . Family history of heart disease   . High cholesterol   . Hypertension   . Interstitial lung disease ([place])   . 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    . Morbid  obesity ([place])   . Normal coronary arteries    cardiac catheterization performed  10/31/14  . RA (rheumatoid arthritis) ([place])    has been off methotreaxte since 10/13.  [name] Shortness of breath   . Thyroid disease   . URI (upper respiratory infection)    BP 125/81 (BP Location: Left Wrist, Patient Position: Sitting, Cuff Size: Normal)   Pulse 90   Resp 14   SpO2 92%   Opioid Risk Score:   Fall Risk Score:  `1  Depression screen PHQ 2/9  Depression screen [facility] 2/9 09/04/2016 08/09/2015 01/05/2015 09/20/2014 08/10/2013  Decreased Interest 0 _0 -  Down, Depressed, Hopeless 0 _1 PHQ - 2 Score 0 _2 Altered sleeping - - 2 0 -  Tired, decreased energy - - 2 3 -  Change in appetite - - 2 3 -  Feeling bad or failure about yourself  - - 0 3 -  Trouble concentrating - - 1 1 -  Moving slowly or fidgety/restless - - 0 2 -  Suicidal thoughts - - 0 0 -  PHQ-9 Score - - 10 16 -  Difficult doing work/chores - - Somewhat difficult - -  Some recent data might be hidden    Review of Systems  Constitutional: Positive for chills, diaphoresis and fever.  HENT: Negative.   Eyes: Negative.   Respiratory: Positive for shortness of breath.        Respiratory infection   Cardiovascular: Positive for leg swelling.  Gastrointestinal: Positive for constipation.  Endocrine: Negative.   Genitourinary: Positive for difficulty urinating.  Musculoskeletal: Positive for arthralgias, back pain and gait problem.  Allergic/Immunologic: Negative.   Neurological: Positive for numbness.       Tingling  Hematological: Negative.   Psychiatric/Behavioral: Negative.        Objective:   Physical Exam  Constitutional: She is oriented to person, place, and time. She appears well-developed and well-nourished.  HENT:  Head: Normocephalic and atraumatic.  Neck: Normal range of motion. Neck supple.  Cardiovascular: Normal rate and regular rhythm.   Pulmonary/Chest: Effort normal and breath sounds normal.    Musculoskeletal:  Normal Muscle Bulk and Muscle Testing Reveals: Upper Extremities: Full ROM and Muscle Strength 4/5 Thoracic Paraspinal Tenderness: T-7-T-9 Lower Extremities: Full ROM and Muscle Strength 4/5 Bilateral Lower Extremities Flexion Produces Pain into Bilateral Patella's L>R Arises from Table Slowly using walker for support Antalgic Gait  Neurological: She is alert and oriented to person, place, and time.  Skin: Skin is warm and dry.  Psychiatric: She has a normal mood and affect.  Nursing note and vitals reviewed.         Assessment & Plan:  1. Lumbar  spinal stenosis with neurogenic claudication. Associated facet arthropathy: Continue Gabapentinand HEP as tolerated. 10/15/2016 Continue : MS Contin 15 mg one tablet every 12 hours #60and continueMSIR 15 mg 1 one tablet every 8hours as needed #90.  No scripts given. We will continue the opioid monitoring program, this consists of regular clinic visits, examinations, urine drug screen, pill counts as well as use of [place] Controlled Substance reporting System. 2. Depression: Continue Cymbalta. 10/15/2016 3. Diabetes mellitus type 2 with polyneuropathy: Continue Gabapentin. 10/15/2016 4. Rheumatoid arthritis and osteoarthritis. Contnue Voltaren Gel. Rheumatology Following. 10/15/2016 5. Interstitial lung disease: Pulmonology Following. 10/15/2016 6. Bilateral Osteoarthritis Knee's: Continue Voltaren Gel.10/15/2016 7. Muscle Spasm: Continue : Flexeril. 10/15/2016 8. Left Greater Trochanteric tenderness: No complaints today.Continue with Ice/Heat Therapy. 10/15/2016 9. Polyarthralgia: Rheumatology Following: Continue to monitor. 10/15/2016 10. Morbid Obesitity: Continue Healthy Diet Regime and HEP. 10/15/2016. 11. Muscle Spasm: Continue Flexeril. 10/15/2016  20 minutes of face to face patient care time was spent during this visit. All questions were encouraged and answered.   F/U in 1 month

## 2016-11-02 ENCOUNTER — Other Ambulatory Visit: Payer: Self-pay | Admitting: Internal Medicine

## 2016-11-05 ENCOUNTER — Encounter: Payer: Self-pay | Admitting: Podiatry

## 2016-11-05 ENCOUNTER — Ambulatory Visit (INDEPENDENT_AMBULATORY_CARE_PROVIDER_SITE_OTHER): Payer: Medicare Other | Admitting: Podiatry

## 2016-11-05 ENCOUNTER — Telehealth: Payer: Self-pay | Admitting: *Deleted

## 2016-11-05 ENCOUNTER — Encounter: Payer: Medicare Other | Attending: Physical Medicine & Rehabilitation | Admitting: Physical Medicine & Rehabilitation

## 2016-11-05 DIAGNOSIS — M545 Low back pain: Secondary | ICD-10-CM | POA: Insufficient documentation

## 2016-11-05 DIAGNOSIS — M48062 Spinal stenosis, lumbar region with neurogenic claudication: Secondary | ICD-10-CM | POA: Insufficient documentation

## 2016-11-05 DIAGNOSIS — F329 Major depressive disorder, single episode, unspecified: Secondary | ICD-10-CM | POA: Insufficient documentation

## 2016-11-05 DIAGNOSIS — Q828 Other specified congenital malformations of skin: Secondary | ICD-10-CM

## 2016-11-05 DIAGNOSIS — E079 Disorder of thyroid, unspecified: Secondary | ICD-10-CM | POA: Insufficient documentation

## 2016-11-05 DIAGNOSIS — I129 Hypertensive chronic kidney disease with stage 1 through stage 4 chronic kidney disease, or unspecified chronic kidney disease: Secondary | ICD-10-CM | POA: Insufficient documentation

## 2016-11-05 DIAGNOSIS — J8410 Pulmonary fibrosis, unspecified: Secondary | ICD-10-CM | POA: Insufficient documentation

## 2016-11-05 DIAGNOSIS — E1142 Type 2 diabetes mellitus with diabetic polyneuropathy: Secondary | ICD-10-CM | POA: Insufficient documentation

## 2016-11-05 DIAGNOSIS — D689 Coagulation defect, unspecified: Secondary | ICD-10-CM | POA: Insufficient documentation

## 2016-11-05 DIAGNOSIS — E7800 Pure hypercholesterolemia, unspecified: Secondary | ICD-10-CM | POA: Insufficient documentation

## 2016-11-05 DIAGNOSIS — M79676 Pain in unspecified toe(s): Secondary | ICD-10-CM

## 2016-11-05 DIAGNOSIS — Z8051 Family history of malignant neoplasm of kidney: Secondary | ICD-10-CM | POA: Insufficient documentation

## 2016-11-05 DIAGNOSIS — M069 Rheumatoid arthritis, unspecified: Secondary | ICD-10-CM | POA: Insufficient documentation

## 2016-11-05 DIAGNOSIS — Z8249 Family history of ischemic heart disease and other diseases of the circulatory system: Secondary | ICD-10-CM | POA: Insufficient documentation

## 2016-11-05 DIAGNOSIS — M170 Bilateral primary osteoarthritis of knee: Secondary | ICD-10-CM | POA: Insufficient documentation

## 2016-11-05 DIAGNOSIS — Z803 Family history of malignant neoplasm of breast: Secondary | ICD-10-CM | POA: Insufficient documentation

## 2016-11-05 DIAGNOSIS — B351 Tinea unguium: Secondary | ICD-10-CM | POA: Diagnosis not present

## 2016-11-05 DIAGNOSIS — N189 Chronic kidney disease, unspecified: Secondary | ICD-10-CM | POA: Insufficient documentation

## 2016-11-05 DIAGNOSIS — M7062 Trochanteric bursitis, left hip: Secondary | ICD-10-CM | POA: Insufficient documentation

## 2016-11-05 DIAGNOSIS — Z9889 Other specified postprocedural states: Secondary | ICD-10-CM | POA: Insufficient documentation

## 2016-11-05 DIAGNOSIS — E1122 Type 2 diabetes mellitus with diabetic chronic kidney disease: Secondary | ICD-10-CM | POA: Insufficient documentation

## 2016-11-05 DIAGNOSIS — Z833 Family history of diabetes mellitus: Secondary | ICD-10-CM | POA: Insufficient documentation

## 2016-11-05 DIAGNOSIS — M62838 Other muscle spasm: Secondary | ICD-10-CM | POA: Insufficient documentation

## 2016-11-05 DIAGNOSIS — Z808 Family history of malignant neoplasm of other organs or systems: Secondary | ICD-10-CM | POA: Insufficient documentation

## 2016-11-05 DIAGNOSIS — Z801 Family history of malignant neoplasm of trachea, bronchus and lung: Secondary | ICD-10-CM | POA: Insufficient documentation

## 2016-11-05 DIAGNOSIS — J449 Chronic obstructive pulmonary disease, unspecified: Secondary | ICD-10-CM | POA: Insufficient documentation

## 2016-11-05 DIAGNOSIS — R200 Anesthesia of skin: Secondary | ICD-10-CM | POA: Insufficient documentation

## 2016-11-05 NOTE — Progress Notes (Signed)
This patient presents the office for preventative foot care services. She says her nails have become thick and long are painful walking and wearing her shoes. She also says that she is experiencing pain from the callus on the outside of her left forefoot. She also says that she is here to be casted for her new diabetic shoes.    GENERAL APPEARANCE: Alert, conversant. Appropriately groomed. No acute distress.  VASCULAR: Pedal pulses are  palpable at  [facility] and  bilateral. Her PT pulses are non palpable secondary to her severe swelling. Capillary refill time is immediate to all digits,  Normal temperature gradient.   NEUROLOGIC: sensation is diminished  to 5.07 monofilament at 5/5 sites bilateral.  Light touch is intact bilateral, Muscle strength normal.  MUSCULOSKELETAL: acceptable muscle strength, tone and stability bilateral.  HAV  B/L.  DERMATOLOGIC: skin color, texture, and turgor are within normal limits.  No preulcerative lesions or ulcers  are seen, no interdigital maceration noted.  No open lesions present.   No drainage noted. Porokeratosis sub 5th met left foot.   NAILS  Thick disfigured discolored nails both feet.  Diagnosis  Onychomycosis    Diabeties with neuropathy.  IE  debridement of the mycotic  Return to the office in 3 months for continued nail care at that visit.  We are unable to cast her for diabetic shoes at this visit since we are out of casting material.  She is scheduled to return to the office in one week for an appointment with Liliane Channel.   Gardiner Barefoot DPM

## 2016-11-05 NOTE — Telephone Encounter (Signed)
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

## 2016-11-11 ENCOUNTER — Ambulatory Visit: Payer: Medicare Other | Admitting: Orthotics

## 2016-11-13 DIAGNOSIS — Z7409 Other reduced mobility: Secondary | ICD-10-CM | POA: Diagnosis not present

## 2016-11-13 DIAGNOSIS — R269 Unspecified abnormalities of gait and mobility: Secondary | ICD-10-CM | POA: Diagnosis not present

## 2016-11-13 DIAGNOSIS — M069 Rheumatoid arthritis, unspecified: Secondary | ICD-10-CM | POA: Diagnosis not present

## 2016-11-13 DIAGNOSIS — M4800 Spinal stenosis, site unspecified: Secondary | ICD-10-CM | POA: Diagnosis not present

## 2016-11-13 DIAGNOSIS — J849 Interstitial pulmonary disease, unspecified: Secondary | ICD-10-CM | POA: Diagnosis not present

## 2016-11-13 DIAGNOSIS — M4716 Other spondylosis with myelopathy, lumbar region: Secondary | ICD-10-CM | POA: Diagnosis not present

## 2016-11-20 ENCOUNTER — Other Ambulatory Visit: Payer: Self-pay | Admitting: Registered Nurse

## 2016-11-20 MED ORDER — GABAPENTIN 600 MG PO TABS: ORAL_TABLET | ORAL | 0 refills | Status: DC

## 2016-11-21 DIAGNOSIS — M79605 Pain in left leg: Secondary | ICD-10-CM | POA: Diagnosis not present

## 2016-11-21 DIAGNOSIS — F329 Major depressive disorder, single episode, unspecified: Secondary | ICD-10-CM | POA: Diagnosis not present

## 2016-11-21 DIAGNOSIS — Z7409 Other reduced mobility: Secondary | ICD-10-CM | POA: Diagnosis not present

## 2016-11-28 ENCOUNTER — Encounter: Payer: Self-pay | Admitting: Registered Nurse

## 2016-11-28 ENCOUNTER — Encounter: Payer: Medicare Other | Attending: Physical Medicine & Rehabilitation | Admitting: Registered Nurse

## 2016-11-28 VITALS — BP 111/78 | HR 92

## 2016-11-28 DIAGNOSIS — M2550 Pain in unspecified joint: Secondary | ICD-10-CM

## 2016-11-28 DIAGNOSIS — Z5181 Encounter for therapeutic drug level monitoring: Secondary | ICD-10-CM | POA: Diagnosis not present

## 2016-11-28 DIAGNOSIS — E1122 Type 2 diabetes mellitus with diabetic chronic kidney disease: Secondary | ICD-10-CM | POA: Insufficient documentation

## 2016-11-28 DIAGNOSIS — M7062 Trochanteric bursitis, left hip: Secondary | ICD-10-CM

## 2016-11-28 DIAGNOSIS — Z8051 Family history of malignant neoplasm of kidney: Secondary | ICD-10-CM | POA: Insufficient documentation

## 2016-11-28 DIAGNOSIS — M170 Bilateral primary osteoarthritis of knee: Secondary | ICD-10-CM | POA: Insufficient documentation

## 2016-11-28 DIAGNOSIS — Z803 Family history of malignant neoplasm of breast: Secondary | ICD-10-CM | POA: Insufficient documentation

## 2016-11-28 DIAGNOSIS — M4696 Unspecified inflammatory spondylopathy, lumbar region: Secondary | ICD-10-CM

## 2016-11-28 DIAGNOSIS — I129 Hypertensive chronic kidney disease with stage 1 through stage 4 chronic kidney disease, or unspecified chronic kidney disease: Secondary | ICD-10-CM | POA: Insufficient documentation

## 2016-11-28 DIAGNOSIS — Z79899 Other long term (current) drug therapy: Secondary | ICD-10-CM

## 2016-11-28 DIAGNOSIS — G894 Chronic pain syndrome: Secondary | ICD-10-CM

## 2016-11-28 DIAGNOSIS — M62838 Other muscle spasm: Secondary | ICD-10-CM

## 2016-11-28 DIAGNOSIS — D689 Coagulation defect, unspecified: Secondary | ICD-10-CM | POA: Insufficient documentation

## 2016-11-28 DIAGNOSIS — M05721 Rheumatoid arthritis with rheumatoid factor of right elbow without organ or systems involvement: Secondary | ICD-10-CM

## 2016-11-28 DIAGNOSIS — M1712 Unilateral primary osteoarthritis, left knee: Secondary | ICD-10-CM

## 2016-11-28 DIAGNOSIS — J8410 Pulmonary fibrosis, unspecified: Secondary | ICD-10-CM | POA: Insufficient documentation

## 2016-11-28 DIAGNOSIS — R5381 Other malaise: Secondary | ICD-10-CM

## 2016-11-28 DIAGNOSIS — M069 Rheumatoid arthritis, unspecified: Secondary | ICD-10-CM | POA: Insufficient documentation

## 2016-11-28 DIAGNOSIS — E079 Disorder of thyroid, unspecified: Secondary | ICD-10-CM | POA: Insufficient documentation

## 2016-11-28 DIAGNOSIS — N189 Chronic kidney disease, unspecified: Secondary | ICD-10-CM | POA: Insufficient documentation

## 2016-11-28 DIAGNOSIS — M47816 Spondylosis without myelopathy or radiculopathy, lumbar region: Secondary | ICD-10-CM

## 2016-11-28 DIAGNOSIS — E1140 Type 2 diabetes mellitus with diabetic neuropathy, unspecified: Secondary | ICD-10-CM | POA: Diagnosis not present

## 2016-11-28 DIAGNOSIS — M5416 Radiculopathy, lumbar region: Secondary | ICD-10-CM | POA: Diagnosis not present

## 2016-11-28 DIAGNOSIS — E7800 Pure hypercholesterolemia, unspecified: Secondary | ICD-10-CM | POA: Insufficient documentation

## 2016-11-28 DIAGNOSIS — Z9889 Other specified postprocedural states: Secondary | ICD-10-CM | POA: Insufficient documentation

## 2016-11-28 DIAGNOSIS — M1711 Unilateral primary osteoarthritis, right knee: Secondary | ICD-10-CM | POA: Diagnosis not present

## 2016-11-28 DIAGNOSIS — M48062 Spinal stenosis, lumbar region with neurogenic claudication: Secondary | ICD-10-CM | POA: Insufficient documentation

## 2016-11-28 DIAGNOSIS — M545 Low back pain: Secondary | ICD-10-CM | POA: Insufficient documentation

## 2016-11-28 DIAGNOSIS — F329 Major depressive disorder, single episode, unspecified: Secondary | ICD-10-CM | POA: Insufficient documentation

## 2016-11-28 DIAGNOSIS — Z833 Family history of diabetes mellitus: Secondary | ICD-10-CM | POA: Insufficient documentation

## 2016-11-28 DIAGNOSIS — Z808 Family history of malignant neoplasm of other organs or systems: Secondary | ICD-10-CM | POA: Insufficient documentation

## 2016-11-28 DIAGNOSIS — J449 Chronic obstructive pulmonary disease, unspecified: Secondary | ICD-10-CM | POA: Insufficient documentation

## 2016-11-28 DIAGNOSIS — R200 Anesthesia of skin: Secondary | ICD-10-CM | POA: Insufficient documentation

## 2016-11-28 DIAGNOSIS — Z8249 Family history of ischemic heart disease and other diseases of the circulatory system: Secondary | ICD-10-CM | POA: Insufficient documentation

## 2016-11-28 DIAGNOSIS — Z801 Family history of malignant neoplasm of trachea, bronchus and lung: Secondary | ICD-10-CM | POA: Insufficient documentation

## 2016-11-28 DIAGNOSIS — E1142 Type 2 diabetes mellitus with diabetic polyneuropathy: Secondary | ICD-10-CM | POA: Insufficient documentation

## 2016-11-28 MED ORDER — MORPHINE SULFATE ER 15 MG PO TBCR: 15.0000 mg | EXTENDED_RELEASE_TABLET | Freq: Two times a day (BID) | ORAL | 0 refills | Status: DC

## 2016-11-28 MED ORDER — MORPHINE SULFATE 15 MG PO TABS: 15.0000 mg | ORAL_TABLET | Freq: Three times a day (TID) | ORAL | 0 refills | Status: DC | PRN

## 2016-11-28 NOTE — Progress Notes (Signed)
Subjective:    Patient ID: [name], female    DOB: Oct 02, 1940, 76 y.o.   MRN: [id number]  HPI: [name] is a 76 year old female who returns for follow up appointment for chronic painand medication refill. She states her pain is located in her lower back radiating into her left buttock, left hip, left lower extremity,bilateral knee pain and tingling and burning into bilateral feet. She ratesher pain 3. Her current exercise regime is performing chair exercises and walking short distances with walker.  [name] missed her last appointment, she ran out of her Morphine extended release medication which caused her to use her MSIR until this appointment when she scheduled a new appointment,. she didn't let the office staff know she was going to run out of her medication. Educated on the above she verbalizes understanding.   [name] last UDS was on 09/04/2016, it was consistent.  Also states her PCP advised her to increase her Cymbalta to BID, she will follow up with her PCP.   Pain Inventory Average Pain 5 Pain Right Now 3 My pain is intermittent, sharp and burning  In the last 24 hours, has pain interfered with the following? General activity 7 Relation with others 7 Enjoyment of life 7 What TIME of day is your pain at its worst? morning Sleep (in general) Fair  Pain is worse with: walking, bending and standing Pain improves with: rest, heat/ice and medication Relief from Meds: no ans  Mobility use a cane use a walker how many minutes can you walk? 8 ability to climb steps?  no do you drive?  no  Function I need assistance with the following:  meal prep, household duties and shopping  Neuro/Psych weakness numbness trouble walking dizziness  Prior Studies Any changes since last visit?  no  Physicians involved in your care Any changes since last visit?  no   Family History  Problem Relation Age of Onset  . Diabetes Mother   . Heart  attack Mother   . Hypertension Father   . Lung cancer Father   . Diabetes Sister   . Diabetes Brother   . Hypertension Brother   . Heart disease Brother   . Heart attack Brother   . Kidney cancer Brother   . Uterine cancer Daughter   . Breast cancer Sister   . Rheum arthritis Maternal Uncle   . Gout Brother   . Kidney failure Brother        x 5   Social History   Social History  . Marital status: Married    Spouse name: N/A  . Number of children: 6  . Years of education: college   Occupational History  . retired Marine scientist    Social History Main Topics  . Smoking status: Never Smoker  . Smokeless tobacco: Never Used  . Alcohol use No  . Drug use: No  . Sexual activity: Not Currently   Other Topics Concern  . None   Social History Narrative   Patient consumes 2-3 cups coffee per day.   Past Surgical History:  Procedure Laterality Date  . ABDOMINAL HYSTERECTOMY    . BRAIN SURGERY     Gamma knife 10/13. Needs repeat spring  '14  . CARDIAC CATHETERIZATION N/A 10/31/2014   Procedure: Right/Left Heart Cath and Coronary Angiography;  Surgeon: [name], MD;  Location: [place] [facility];  Service: Cardiovascular;  Laterality: N/A;  . ESOPHAGOGASTRODUODENOSCOPY (EGD) WITH PROPOFOL N/A 09/14/2014  Procedure: ESOPHAGOGASTRODUODENOSCOPY (EGD) WITH PROPOFOL;  Surgeon: [name], MD;  [facility];  Service: Endoscopy;  Laterality: N/A;  . OVARY SURGERY    . SHOULDER SURGERY Left   . TONSILLECTOMY  age 71  . VIDEO BRONCHOSCOPY Bilateral 05/31/2013   Procedure: VIDEO BRONCHOSCOPY WITHOUT FLUORO;  Surgeon: Brand Males, MD;  Location: [place];  Service: Cardiopulmonary;  Laterality: Bilateral;  . video bronscoscopy  2000   lung   Past Medical History:  Diagnosis Date  . Arthritis    endstage changes bilateral knees/bilateral ankles.   . Asthma   . Carotid artery occlusion   . Chronic kidney disease   . Clotting disorder ([place])    pt denies  this  . Contusion of left knee    due to fall 1/14.  [name] COPD (chronic obstructive pulmonary disease) (HCC)    pulmonary fibrosis  . Depression, reactive   . Diabetes mellitus   . Diastolic dysfunction   . Family history of heart disease   . High cholesterol   . Hypertension   . Interstitial lung disease ([place])   . 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    . Morbid obesity ([place])   . Normal coronary arteries    cardiac catheterization performed  10/31/14  . RA (rheumatoid arthritis) ([place])    has been off methotreaxte since 10/13.  [name] Shortness of breath   . Thyroid disease   . URI (upper respiratory infection)    BP 111/78   Pulse 92   SpO2 92%   Opioid Risk Score:  0 Fall Risk Score:  `1  Depression screen PHQ 2/9  Depression screen [facility] 2/9 11/28/2016 09/04/2016 08/09/2015 01/05/2015 09/20/2014 08/10/2013  Decreased Interest 0 0 _0 -  Down, Depressed, Hopeless 0 0 _1 PHQ - 2 Score 0 0 _2 Altered sleeping - - - 2 0 -  Tired, decreased energy - - - 2 3 -  Change in appetite - - - 2 3 -  Feeling bad or failure about yourself  - - - 0 3 -  Trouble concentrating - - - 1 1 -  Moving slowly or fidgety/restless - - - 0 2 -  Suicidal thoughts - - - 0 0 -  PHQ-9 Score - - - 10 16 -  Difficult doing work/chores - - - Somewhat difficult - -  Some recent data might be hidden    Review of Systems  Constitutional: Positive for diaphoresis.  HENT: Negative.   Eyes: Negative.   Respiratory: Positive for cough and wheezing.   Cardiovascular: Positive for leg swelling.  Gastrointestinal: Negative.   Endocrine: Negative.   Genitourinary: Positive for urgency.  Musculoskeletal: Positive for gait problem.  Skin: Negative.   Allergic/Immunologic: Negative.   Neurological: Positive for weakness and numbness.  Hematological: Negative.   Psychiatric/Behavioral: Negative.   All other  systems reviewed and are negative.      Objective:   Physical Exam  Constitutional: She is oriented to person, place, and time. She appears well-developed and well-nourished.  HENT:  Head: Normocephalic and atraumatic.  Neck: Normal range of motion. Neck supple.  Cardiovascular: Normal rate and regular rhythm.   Pulmonary/Chest: Effort normal and breath sounds normal.  Musculoskeletal:  Normal Muscle Bulk and Muscle Testing Reveals: Upper Extremities: Full ROM and Muscle Strength 5/5 Thoracic Hypersensitivity Lumbar Paraspinal Tenderness: L-3-L-5 Left Greater  Trochanteric Tenderness Lower Extremities: Full ROM and Muscle Strength 5/5 Arises from Table slowly Using walker for support Narrow Based Gait  Neurological: She is alert and oriented to person, place, and time.  Skin: Skin is warm and dry.  Psychiatric: She has a normal mood and affect.  Nursing note and vitals reviewed.         Assessment & Plan:  1. Lumbar spinal stenosis with neurogenic claudication. Associated facet arthropathy: Continue Gabapentinand HEP as tolerated. 11/28/2016 Continue : MS Contin 15 mg one tablet every 12 hours #60and continueMSIR 15 mg 1 one tablet every 8hours as needed #90.  We will continue the opioid monitoring program, this consists of regular clinic visits, examinations, urine drug screen, pill counts as well as use of [place] Controlled Substance reporting System. 2. Depression: Continue Cymbalta. 11/28/2016 3. Diabetes mellitus type 2 with polyneuropathy: Continue Gabapentin. 11/28/2016 4. Rheumatoid arthritis and osteoarthritis. Contnue Voltaren Gel. Rheumatology Following. 11/28/2016 5. Interstitial lung disease: Pulmonology Following. 11/28/2016 6. Bilateral Osteoarthritis Knee's: Continue Voltaren Gel.11/28/2016 7. Muscle Spasm: Continue : Flexeril. 11/28/2016 8. Left Greater Trochanteric tenderness: Continue with Ice/Heat Therapy. 11/28/2016 9. Polyarthralgia:  Rheumatology Following: Continue to monitor. 11/28/2016 10. Morbid Obesitity: Continue Healthy Diet Regime and HEP. 11/28/2016.  20 minutes of face to face patient care time was spent during this visit. All questions were encouraged and answered.   F/U in 1 month

## 2016-11-29 ENCOUNTER — Inpatient Hospital Stay (HOSPITAL_COMMUNITY): Payer: Medicare Other

## 2016-11-29 ENCOUNTER — Emergency Department (HOSPITAL_COMMUNITY): Payer: Medicare Other

## 2016-11-29 ENCOUNTER — Inpatient Hospital Stay (HOSPITAL_COMMUNITY): Payer: Medicare Other | Admitting: Anesthesiology

## 2016-11-29 ENCOUNTER — Inpatient Hospital Stay (HOSPITAL_COMMUNITY)
Admission: EM | Admit: 2016-11-29 | Discharge: 2016-12-02 | DRG: 481 | Disposition: A | Discharge status: Skilled Nursing Facility | Payer: Medicare Other | Attending: Family Medicine | Admitting: Family Medicine

## 2016-11-29 ENCOUNTER — Encounter (HOSPITAL_COMMUNITY): Payer: Self-pay | Admitting: Emergency Medicine

## 2016-11-29 ENCOUNTER — Encounter (HOSPITAL_COMMUNITY)
Admission: EM | Disposition: A | Discharge status: Skilled Nursing Facility | Payer: Self-pay | Source: Home / Self Care | Attending: Family Medicine

## 2016-11-29 DIAGNOSIS — D62 Acute posthemorrhagic anemia: Secondary | ICD-10-CM | POA: Diagnosis not present

## 2016-11-29 DIAGNOSIS — J8410 Pulmonary fibrosis, unspecified: Secondary | ICD-10-CM | POA: Diagnosis present

## 2016-11-29 DIAGNOSIS — Z833 Family history of diabetes mellitus: Secondary | ICD-10-CM | POA: Diagnosis not present

## 2016-11-29 DIAGNOSIS — Z4789 Encounter for other orthopedic aftercare: Secondary | ICD-10-CM | POA: Diagnosis not present

## 2016-11-29 DIAGNOSIS — N183 Chronic kidney disease, stage 3 (moderate): Secondary | ICD-10-CM | POA: Diagnosis present

## 2016-11-29 DIAGNOSIS — Z86011 Personal history of benign neoplasm of the brain: Secondary | ICD-10-CM

## 2016-11-29 DIAGNOSIS — K219 Gastro-esophageal reflux disease without esophagitis: Secondary | ICD-10-CM | POA: Diagnosis present

## 2016-11-29 DIAGNOSIS — E785 Hyperlipidemia, unspecified: Secondary | ICD-10-CM | POA: Diagnosis present

## 2016-11-29 DIAGNOSIS — I10 Essential (primary) hypertension: Secondary | ICD-10-CM | POA: Diagnosis not present

## 2016-11-29 DIAGNOSIS — S72002A Fracture of unspecified part of neck of left femur, initial encounter for closed fracture: Secondary | ICD-10-CM | POA: Diagnosis not present

## 2016-11-29 DIAGNOSIS — Z7982 Long term (current) use of aspirin: Secondary | ICD-10-CM

## 2016-11-29 DIAGNOSIS — F329 Major depressive disorder, single episode, unspecified: Secondary | ICD-10-CM | POA: Diagnosis present

## 2016-11-29 DIAGNOSIS — Z7951 Long term (current) use of inhaled steroids: Secondary | ICD-10-CM | POA: Diagnosis not present

## 2016-11-29 DIAGNOSIS — J9611 Chronic respiratory failure with hypoxia: Secondary | ICD-10-CM | POA: Diagnosis present

## 2016-11-29 DIAGNOSIS — Z7952 Long term (current) use of systemic steroids: Secondary | ICD-10-CM | POA: Diagnosis not present

## 2016-11-29 DIAGNOSIS — S99912A Unspecified injury of left ankle, initial encounter: Secondary | ICD-10-CM | POA: Diagnosis not present

## 2016-11-29 DIAGNOSIS — M19071 Primary osteoarthritis, right ankle and foot: Secondary | ICD-10-CM | POA: Diagnosis present

## 2016-11-29 DIAGNOSIS — M109 Gout, unspecified: Secondary | ICD-10-CM

## 2016-11-29 DIAGNOSIS — J849 Interstitial pulmonary disease, unspecified: Secondary | ICD-10-CM | POA: Diagnosis present

## 2016-11-29 DIAGNOSIS — R279 Unspecified lack of coordination: Secondary | ICD-10-CM | POA: Diagnosis not present

## 2016-11-29 DIAGNOSIS — M1711 Unilateral primary osteoarthritis, right knee: Secondary | ICD-10-CM

## 2016-11-29 DIAGNOSIS — Z419 Encounter for procedure for purposes other than remedying health state, unspecified: Secondary | ICD-10-CM

## 2016-11-29 DIAGNOSIS — W19XXXA Unspecified fall, initial encounter: Secondary | ICD-10-CM | POA: Diagnosis not present

## 2016-11-29 DIAGNOSIS — Z90710 Acquired absence of both cervix and uterus: Secondary | ICD-10-CM | POA: Diagnosis not present

## 2016-11-29 DIAGNOSIS — I959 Hypotension, unspecified: Secondary | ICD-10-CM | POA: Diagnosis not present

## 2016-11-29 DIAGNOSIS — R41841 Cognitive communication deficit: Secondary | ICD-10-CM | POA: Diagnosis not present

## 2016-11-29 DIAGNOSIS — M069 Rheumatoid arthritis, unspecified: Secondary | ICD-10-CM | POA: Diagnosis present

## 2016-11-29 DIAGNOSIS — Z801 Family history of malignant neoplasm of trachea, bronchus and lung: Secondary | ICD-10-CM | POA: Diagnosis not present

## 2016-11-29 DIAGNOSIS — I5033 Acute on chronic diastolic (congestive) heart failure: Secondary | ICD-10-CM | POA: Diagnosis not present

## 2016-11-29 DIAGNOSIS — S72142A Displaced intertrochanteric fracture of left femur, initial encounter for closed fracture: Secondary | ICD-10-CM | POA: Diagnosis not present

## 2016-11-29 DIAGNOSIS — Z803 Family history of malignant neoplasm of breast: Secondary | ICD-10-CM

## 2016-11-29 DIAGNOSIS — D500 Iron deficiency anemia secondary to blood loss (chronic): Secondary | ICD-10-CM | POA: Diagnosis not present

## 2016-11-29 DIAGNOSIS — N179 Acute kidney failure, unspecified: Secondary | ICD-10-CM | POA: Diagnosis present

## 2016-11-29 DIAGNOSIS — Z6841 Body Mass Index (BMI) 40.0 and over, adult: Secondary | ICD-10-CM

## 2016-11-29 DIAGNOSIS — R52 Pain, unspecified: Secondary | ICD-10-CM

## 2016-11-29 DIAGNOSIS — S7222XA Displaced subtrochanteric fracture of left femur, initial encounter for closed fracture: Secondary | ICD-10-CM | POA: Diagnosis not present

## 2016-11-29 DIAGNOSIS — R1311 Dysphagia, oral phase: Secondary | ICD-10-CM | POA: Diagnosis not present

## 2016-11-29 DIAGNOSIS — Z794 Long term (current) use of insulin: Secondary | ICD-10-CM

## 2016-11-29 DIAGNOSIS — M19072 Primary osteoarthritis, left ankle and foot: Secondary | ICD-10-CM | POA: Diagnosis present

## 2016-11-29 DIAGNOSIS — M1712 Unilateral primary osteoarthritis, left knee: Secondary | ICD-10-CM

## 2016-11-29 DIAGNOSIS — Z885 Allergy status to narcotic agent status: Secondary | ICD-10-CM

## 2016-11-29 DIAGNOSIS — I5032 Chronic diastolic (congestive) heart failure: Secondary | ICD-10-CM | POA: Diagnosis present

## 2016-11-29 DIAGNOSIS — F32A Depression, unspecified: Secondary | ICD-10-CM

## 2016-11-29 DIAGNOSIS — T148XXA Other injury of unspecified body region, initial encounter: Secondary | ICD-10-CM | POA: Diagnosis not present

## 2016-11-29 DIAGNOSIS — Z8249 Family history of ischemic heart disease and other diseases of the circulatory system: Secondary | ICD-10-CM

## 2016-11-29 DIAGNOSIS — E1140 Type 2 diabetes mellitus with diabetic neuropathy, unspecified: Secondary | ICD-10-CM | POA: Diagnosis not present

## 2016-11-29 DIAGNOSIS — Z8051 Family history of malignant neoplasm of kidney: Secondary | ICD-10-CM

## 2016-11-29 DIAGNOSIS — Z841 Family history of disorders of kidney and ureter: Secondary | ICD-10-CM

## 2016-11-29 DIAGNOSIS — M6281 Muscle weakness (generalized): Secondary | ICD-10-CM | POA: Diagnosis not present

## 2016-11-29 DIAGNOSIS — E039 Hypothyroidism, unspecified: Secondary | ICD-10-CM | POA: Diagnosis not present

## 2016-11-29 DIAGNOSIS — R42 Dizziness and giddiness: Secondary | ICD-10-CM | POA: Diagnosis not present

## 2016-11-29 DIAGNOSIS — M47816 Spondylosis without myelopathy or radiculopathy, lumbar region: Secondary | ICD-10-CM

## 2016-11-29 DIAGNOSIS — M25572 Pain in left ankle and joints of left foot: Secondary | ICD-10-CM | POA: Diagnosis not present

## 2016-11-29 DIAGNOSIS — M25552 Pain in left hip: Secondary | ICD-10-CM | POA: Diagnosis not present

## 2016-11-29 DIAGNOSIS — I130 Hypertensive heart and chronic kidney disease with heart failure and stage 1 through stage 4 chronic kidney disease, or unspecified chronic kidney disease: Secondary | ICD-10-CM | POA: Diagnosis present

## 2016-11-29 DIAGNOSIS — R278 Other lack of coordination: Secondary | ICD-10-CM | POA: Diagnosis not present

## 2016-11-29 DIAGNOSIS — M05742 Rheumatoid arthritis with rheumatoid factor of left hand without organ or systems involvement: Secondary | ICD-10-CM | POA: Diagnosis not present

## 2016-11-29 DIAGNOSIS — E1122 Type 2 diabetes mellitus with diabetic chronic kidney disease: Secondary | ICD-10-CM | POA: Diagnosis present

## 2016-11-29 DIAGNOSIS — S72009A Fracture of unspecified part of neck of unspecified femur, initial encounter for closed fracture: Secondary | ICD-10-CM | POA: Diagnosis not present

## 2016-11-29 DIAGNOSIS — E079 Disorder of thyroid, unspecified: Secondary | ICD-10-CM | POA: Diagnosis present

## 2016-11-29 DIAGNOSIS — M170 Bilateral primary osteoarthritis of knee: Secondary | ICD-10-CM | POA: Diagnosis present

## 2016-11-29 DIAGNOSIS — J449 Chronic obstructive pulmonary disease, unspecified: Secondary | ICD-10-CM | POA: Diagnosis present

## 2016-11-29 DIAGNOSIS — Z8049 Family history of malignant neoplasm of other genital organs: Secondary | ICD-10-CM

## 2016-11-29 DIAGNOSIS — R262 Difficulty in walking, not elsewhere classified: Secondary | ICD-10-CM | POA: Diagnosis not present

## 2016-11-29 DIAGNOSIS — W1830XA Fall on same level, unspecified, initial encounter: Secondary | ICD-10-CM | POA: Diagnosis present

## 2016-11-29 DIAGNOSIS — M25579 Pain in unspecified ankle and joints of unspecified foot: Secondary | ICD-10-CM

## 2016-11-29 DIAGNOSIS — D329 Benign neoplasm of meninges, unspecified: Secondary | ICD-10-CM | POA: Diagnosis present

## 2016-11-29 DIAGNOSIS — R5382 Chronic fatigue, unspecified: Secondary | ICD-10-CM | POA: Diagnosis present

## 2016-11-29 DIAGNOSIS — E876 Hypokalemia: Secondary | ICD-10-CM | POA: Diagnosis present

## 2016-11-29 DIAGNOSIS — Z888 Allergy status to other drugs, medicaments and biological substances status: Secondary | ICD-10-CM

## 2016-11-29 DIAGNOSIS — M05741 Rheumatoid arthritis with rheumatoid factor of right hand without organ or systems involvement: Secondary | ICD-10-CM | POA: Diagnosis not present

## 2016-11-29 DIAGNOSIS — I517 Cardiomegaly: Secondary | ICD-10-CM | POA: Diagnosis not present

## 2016-11-29 DIAGNOSIS — I2510 Atherosclerotic heart disease of native coronary artery without angina pectoris: Secondary | ICD-10-CM | POA: Diagnosis not present

## 2016-11-29 DIAGNOSIS — R5381 Other malaise: Secondary | ICD-10-CM | POA: Diagnosis present

## 2016-11-29 HISTORY — PX: INTRAMEDULLARY (IM) NAIL INTERTROCHANTERIC: SHX5875

## 2016-11-29 HISTORY — DX: Displaced intertrochanteric fracture of left femur, initial encounter for closed fracture: S72.142A

## 2016-11-29 LAB — GLUCOSE, CAPILLARY
GLUCOSE-CAPILLARY: 93 mg/dL (ref 65–99)
GLUCOSE-CAPILLARY: 129 mg/dL — AB (ref 65–99)
Glucose-Capillary: 226 mg/dL — ABNORMAL HIGH (ref 65–99)
Glucose-Capillary: 293 mg/dL — ABNORMAL HIGH (ref 65–99)

## 2016-11-29 LAB — HEPATIC FUNCTION PANEL
ALT: 29 U/L (ref 14–54)
AST: 22 U/L (ref 15–41)
Albumin: 2.8 g/dL — ABNORMAL LOW (ref 3.5–5.0)
Alkaline Phosphatase: 59 U/L (ref 38–126)
BILIRUBIN INDIRECT: 0.5 mg/dL (ref 0.3–0.9)
Bilirubin, Direct: 0.1 mg/dL (ref 0.1–0.5)
TOTAL PROTEIN: 5 g/dL — AB (ref 6.5–8.1)
Total Bilirubin: 0.6 mg/dL (ref 0.3–1.2)

## 2016-11-29 LAB — CBC WITH DIFFERENTIAL/PLATELET
BASOS PCT: 0 %
Basophils Absolute: 0 10*3/uL (ref 0.0–0.1)
EOS ABS: 0.6 10*3/uL (ref 0.0–0.7)
EOS PCT: 5 %
HCT: 37.7 % (ref 36.0–46.0)
Hemoglobin: 11.6 g/dL — ABNORMAL LOW (ref 12.0–15.0)
LYMPHS ABS: 3.8 10*3/uL (ref 0.7–4.0)
Lymphocytes Relative: 35 %
MCH: 29.7 pg (ref 26.0–34.0)
MCHC: 30.8 g/dL (ref 30.0–36.0)
MCV: 96.4 fL (ref 78.0–100.0)
Monocytes Absolute: 0.6 10*3/uL (ref 0.1–1.0)
Monocytes Relative: 5 %
Neutro Abs: 6.1 10*3/uL (ref 1.7–7.7)
Neutrophils Relative %: 55 %
PLATELETS: 150 10*3/uL (ref 150–400)
RBC: 3.91 MIL/uL (ref 3.87–5.11)
RDW: 16.3 % — ABNORMAL HIGH (ref 11.5–15.5)
WBC: 11.1 10*3/uL — AB (ref 4.0–10.5)

## 2016-11-29 LAB — PROTIME-INR
INR: 1.05
PROTHROMBIN TIME: 13.7 s (ref 11.4–15.2)

## 2016-11-29 LAB — BASIC METABOLIC PANEL
Anion gap: 11 (ref 5–15)
BUN: 21 mg/dL — AB (ref 6–20)
CALCIUM: 8.5 mg/dL — AB (ref 8.9–10.3)
CHLORIDE: 96 mmol/L — AB (ref 101–111)
CO2: 32 mmol/L (ref 22–32)
CREATININE: 1.36 mg/dL — AB (ref 0.44–1.00)
GFR, EST AFRICAN AMERICAN: 43 mL/min — AB (ref >60)
GFR, EST NON AFRICAN AMERICAN: 37 mL/min — AB (ref >60)
Glucose, Bld: 113 mg/dL — ABNORMAL HIGH (ref 65–99)
Potassium: 3.1 mmol/L — ABNORMAL LOW (ref 3.5–5.1)
SODIUM: 139 mmol/L (ref 135–145)

## 2016-11-29 LAB — TYPE AND SCREEN
ABO/RH(D): O POS
ANTIBODY SCREEN: NEGATIVE

## 2016-11-29 LAB — ABO/RH: ABO/RH(D): O POS

## 2016-11-29 LAB — APTT: APTT: 22 s — AB (ref 24–36)

## 2016-11-29 SURGERY — FIXATION, FRACTURE, INTERTROCHANTERIC, WITH INTRAMEDULLARY ROD
Anesthesia: Spinal | Site: Hip | Laterality: Left

## 2016-11-29 MED ORDER — METHOCARBAMOL 500 MG PO TABS
500.0000 mg | ORAL_TABLET | Freq: Four times a day (QID) | ORAL | Status: DC | PRN
  Administered 2016-11-30 – 2016-12-02 (×3): 500 mg via ORAL
  Filled 2016-11-29 (×3): qty 1

## 2016-11-29 MED ORDER — TRAMADOL HCL 50 MG PO TABS
100.0000 mg | ORAL_TABLET | Freq: Four times a day (QID) | ORAL | Status: DC | PRN
  Administered 2016-11-29 – 2016-12-02 (×7): 100 mg via ORAL
  Filled 2016-11-29 (×8): qty 2

## 2016-11-29 MED ORDER — DEXAMETHASONE SODIUM PHOSPHATE 10 MG/ML IJ SOLN
INTRAMUSCULAR | Status: DC | PRN
  Administered 2016-11-29: 10 mg via INTRAVENOUS

## 2016-11-29 MED ORDER — LACTATED RINGERS IV SOLN: INTRAVENOUS | Status: DC | PRN

## 2016-11-29 MED ORDER — HYDROCHLOROTHIAZIDE 25 MG PO TABS
25.0000 mg | ORAL_TABLET | Freq: Every day | ORAL | Status: DC
  Administered 2016-11-30: 25 mg via ORAL
  Filled 2016-11-29: qty 1

## 2016-11-29 MED ORDER — GUAIFENESIN ER 600 MG PO TB12
1200.0000 mg | ORAL_TABLET | Freq: Two times a day (BID) | ORAL | Status: DC
  Administered 2016-11-29 – 2016-12-02 (×6): 1200 mg via ORAL
  Filled 2016-11-29 (×7): qty 2

## 2016-11-29 MED ORDER — POTASSIUM CHLORIDE 10 MEQ/100ML IV SOLN
10.0000 meq | Freq: Once | INTRAVENOUS | Status: AC
  Administered 2016-11-29: 10 meq via INTRAVENOUS
  Filled 2016-11-29: qty 100

## 2016-11-29 MED ORDER — ATORVASTATIN CALCIUM 10 MG PO TABS
10.0000 mg | ORAL_TABLET | Freq: Every evening | ORAL | Status: DC
  Administered 2016-11-29 – 2016-12-02 (×4): 10 mg via ORAL
  Filled 2016-11-29 (×4): qty 1

## 2016-11-29 MED ORDER — MIDAZOLAM HCL 2 MG/2ML IJ SOLN
INTRAMUSCULAR | Status: DC | PRN
  Administered 2016-11-29: 1 mg via INTRAVENOUS

## 2016-11-29 MED ORDER — ONDANSETRON HCL 4 MG/2ML IJ SOLN: 4.0000 mg | Freq: Four times a day (QID) | INTRAMUSCULAR | Status: DC | PRN

## 2016-11-29 MED ORDER — ACETAMINOPHEN 325 MG PO TABS
650.0000 mg | ORAL_TABLET | Freq: Four times a day (QID) | ORAL | Status: DC | PRN
  Administered 2016-11-30 – 2016-12-02 (×3): 650 mg via ORAL
  Filled 2016-11-29 (×3): qty 2

## 2016-11-29 MED ORDER — LIDOCAINE HCL (CARDIAC) 20 MG/ML IV SOLN
INTRAVENOUS | Status: AC
  Filled 2016-11-29: qty 5

## 2016-11-29 MED ORDER — DEXTROSE 5 % IV SOLN
INTRAVENOUS | Status: DC | PRN
  Administered 2016-11-29: 30 ug/min via INTRAVENOUS

## 2016-11-29 MED ORDER — PROPOFOL 1000 MG/100ML IV EMUL
INTRAVENOUS | Status: AC
  Filled 2016-11-29: qty 100

## 2016-11-29 MED ORDER — HYDROMORPHONE HCL 1 MG/ML IJ SOLN: 0.2500 mg | INTRAMUSCULAR | Status: DC | PRN

## 2016-11-29 MED ORDER — POTASSIUM CHLORIDE CRYS ER 20 MEQ PO TBCR
30.0000 meq | EXTENDED_RELEASE_TABLET | Freq: Once | ORAL | Status: DC
  Filled 2016-11-29: qty 1

## 2016-11-29 MED ORDER — PHENYLEPHRINE 40 MCG/ML (10ML) SYRINGE FOR IV PUSH (FOR BLOOD PRESSURE SUPPORT)
PREFILLED_SYRINGE | INTRAVENOUS | Status: AC
  Filled 2016-11-29: qty 10

## 2016-11-29 MED ORDER — PHENYLEPHRINE 40 MCG/ML (10ML) SYRINGE FOR IV PUSH (FOR BLOOD PRESSURE SUPPORT)
PREFILLED_SYRINGE | INTRAVENOUS | Status: DC | PRN
  Administered 2016-11-29 (×4): 200 ug via INTRAVENOUS

## 2016-11-29 MED ORDER — OLMESARTAN MEDOXOMIL-HCTZ 40-25 MG PO TABS: 1.0000 | ORAL_TABLET | Freq: Every day | ORAL | Status: DC

## 2016-11-29 MED ORDER — DULOXETINE HCL 60 MG PO CPEP
60.0000 mg | ORAL_CAPSULE | Freq: Every day | ORAL | Status: DC
  Administered 2016-11-30 – 2016-12-02 (×3): 60 mg via ORAL
  Filled 2016-11-29 (×3): qty 1

## 2016-11-29 MED ORDER — FENTANYL CITRATE (PF) 100 MCG/2ML IJ SOLN
INTRAMUSCULAR | Status: DC | PRN
  Administered 2016-11-29 (×2): 50 ug via INTRAVENOUS

## 2016-11-29 MED ORDER — FLUTICASONE PROPIONATE 50 MCG/ACT NA SUSP
2.0000 | Freq: Every day | NASAL | Status: DC
  Administered 2016-12-01 – 2016-12-02 (×2): 2 via NASAL
  Filled 2016-11-29: qty 16

## 2016-11-29 MED ORDER — PHENOL 1.4 % MT LIQD
1.0000 | OROMUCOSAL | Status: DC | PRN
Start: 2016-11-29 — End: 2016-12-03

## 2016-11-29 MED ORDER — HYDROXYCHLOROQUINE SULFATE 200 MG PO TABS
200.0000 mg | ORAL_TABLET | Freq: Every day | ORAL | Status: DC
  Administered 2016-11-30 – 2016-12-02 (×3): 200 mg via ORAL
  Filled 2016-11-29 (×3): qty 1

## 2016-11-29 MED ORDER — INSULIN ASPART 100 UNIT/ML ~~LOC~~ SOLN
0.0000 [IU] | Freq: Three times a day (TID) | SUBCUTANEOUS | Status: DC
  Administered 2016-11-30 (×2): 2 [IU] via SUBCUTANEOUS
  Administered 2016-11-30 – 2016-12-01 (×2): 5 [IU] via SUBCUTANEOUS

## 2016-11-29 MED ORDER — EPHEDRINE 5 MG/ML INJ
INTRAVENOUS | Status: AC
  Filled 2016-11-29: qty 10

## 2016-11-29 MED ORDER — AZITHROMYCIN 250 MG PO TABS
500.0000 mg | ORAL_TABLET | ORAL | Status: DC
  Administered 2016-12-01: 500 mg via ORAL
  Filled 2016-11-29: qty 2

## 2016-11-29 MED ORDER — BUPIVACAINE IN DEXTROSE 0.75-8.25 % IT SOLN
INTRATHECAL | Status: DC | PRN
  Administered 2016-11-29: 2 mL via INTRATHECAL

## 2016-11-29 MED ORDER — ALBUTEROL SULFATE (2.5 MG/3ML) 0.083% IN NEBU: 2.5000 mg | INHALATION_SOLUTION | RESPIRATORY_TRACT | Status: DC | PRN

## 2016-11-29 MED ORDER — ONDANSETRON HCL 4 MG/2ML IJ SOLN
INTRAMUSCULAR | Status: AC
  Filled 2016-11-29: qty 2

## 2016-11-29 MED ORDER — FENTANYL CITRATE (PF) 250 MCG/5ML IJ SOLN
INTRAMUSCULAR | Status: AC
  Filled 2016-11-29: qty 5

## 2016-11-29 MED ORDER — ALLOPURINOL 300 MG PO TABS
300.0000 mg | ORAL_TABLET | Freq: Every day | ORAL | Status: DC
  Administered 2016-11-30 – 2016-12-02 (×3): 300 mg via ORAL
  Filled 2016-11-29 (×3): qty 1

## 2016-11-29 MED ORDER — SODIUM CHLORIDE 0.9 % IV SOLN
INTRAVENOUS | Status: DC
  Administered 2016-11-29 (×2): via INTRAVENOUS

## 2016-11-29 MED ORDER — SODIUM CHLORIDE 0.9 % IV SOLN: INTRAVENOUS | Status: DC

## 2016-11-29 MED ORDER — OLMESARTAN MEDOXOMIL 40 MG PO TABS
40.0000 mg | ORAL_TABLET | Freq: Every day | ORAL | Status: DC
  Administered 2016-11-30: 40 mg via ORAL
  Filled 2016-11-29: qty 1

## 2016-11-29 MED ORDER — DEXTROSE 5 % IV SOLN: 3.0000 g | Freq: Once | INTRAVENOUS | Status: DC

## 2016-11-29 MED ORDER — MIDAZOLAM HCL 2 MG/2ML IJ SOLN
INTRAMUSCULAR | Status: AC
  Filled 2016-11-29: qty 2

## 2016-11-29 MED ORDER — DOCUSATE SODIUM 100 MG PO CAPS
100.0000 mg | ORAL_CAPSULE | Freq: Two times a day (BID) | ORAL | Status: DC
  Administered 2016-11-29 – 2016-12-02 (×6): 100 mg via ORAL
  Filled 2016-11-29 (×6): qty 1

## 2016-11-29 MED ORDER — ACETAMINOPHEN 650 MG RE SUPP: 650.0000 mg | Freq: Four times a day (QID) | RECTAL | Status: DC | PRN

## 2016-11-29 MED ORDER — METOCLOPRAMIDE HCL 5 MG/ML IJ SOLN: 5.0000 mg | Freq: Three times a day (TID) | INTRAMUSCULAR | Status: DC | PRN

## 2016-11-29 MED ORDER — GABAPENTIN 400 MG PO CAPS
1200.0000 mg | ORAL_CAPSULE | Freq: Once | ORAL | Status: AC
  Administered 2016-11-29: 1200 mg via ORAL
  Filled 2016-11-29: qty 3

## 2016-11-29 MED ORDER — EPHEDRINE SULFATE-NACL 50-0.9 MG/10ML-% IV SOSY
PREFILLED_SYRINGE | INTRAVENOUS | Status: DC | PRN
  Administered 2016-11-29: 10 mg via INTRAVENOUS
  Administered 2016-11-29: 5 mg via INTRAVENOUS
  Administered 2016-11-29 (×2): 10 mg via INTRAVENOUS
  Administered 2016-11-29: 15 mg via INTRAVENOUS

## 2016-11-29 MED ORDER — CEFAZOLIN SODIUM 1 G IJ SOLR
INTRAMUSCULAR | Status: AC
  Filled 2016-11-29: qty 30

## 2016-11-29 MED ORDER — PREDNISONE 20 MG PO TABS
10.0000 mg | ORAL_TABLET | Freq: Every morning | ORAL | Status: DC
  Administered 2016-11-30 – 2016-12-02 (×3): 10 mg via ORAL
  Filled 2016-11-29 (×3): qty 1

## 2016-11-29 MED ORDER — FENTANYL CITRATE (PF) 100 MCG/2ML IJ SOLN
50.0000 ug | INTRAMUSCULAR | Status: AC | PRN
  Administered 2016-11-29 (×2): 50 ug via INTRAVENOUS
  Filled 2016-11-29 (×2): qty 2

## 2016-11-29 MED ORDER — HYDROXYCHLOROQUINE SULFATE 200 MG PO TABS
200.0000 mg | ORAL_TABLET | Freq: Once | ORAL | Status: AC
  Administered 2016-11-29: 200 mg via ORAL
  Filled 2016-11-29: qty 1

## 2016-11-29 MED ORDER — MEPERIDINE HCL 25 MG/ML IJ SOLN: 6.2500 mg | INTRAMUSCULAR | Status: DC | PRN

## 2016-11-29 MED ORDER — FUROSEMIDE 20 MG PO TABS
20.0000 mg | ORAL_TABLET | Freq: Every day | ORAL | Status: DC
  Administered 2016-11-30 – 2016-12-02 (×2): 20 mg via ORAL
  Filled 2016-11-29 (×2): qty 1

## 2016-11-29 MED ORDER — PROPOFOL 10 MG/ML IV BOLUS
INTRAVENOUS | Status: AC
  Filled 2016-11-29: qty 20

## 2016-11-29 MED ORDER — MENTHOL 3 MG MT LOZG: 1.0000 | LOZENGE | OROMUCOSAL | Status: DC | PRN

## 2016-11-29 MED ORDER — CEFAZOLIN SODIUM-DEXTROSE 2-3 GM-% IV SOLR
INTRAVENOUS | Status: DC | PRN
  Administered 2016-11-29: 3 g via INTRAVENOUS

## 2016-11-29 MED ORDER — ASPIRIN EC 81 MG PO TBEC: 81.0000 mg | DELAYED_RELEASE_TABLET | Freq: Every morning | ORAL | Status: DC

## 2016-11-29 MED ORDER — ONDANSETRON HCL 4 MG/2ML IJ SOLN: 4.0000 mg | Freq: Once | INTRAMUSCULAR | Status: DC | PRN

## 2016-11-29 MED ORDER — CEFAZOLIN SODIUM-DEXTROSE 2-4 GM/100ML-% IV SOLN
2.0000 g | Freq: Four times a day (QID) | INTRAVENOUS | Status: AC
  Administered 2016-11-29 – 2016-11-30 (×2): 2 g via INTRAVENOUS
  Filled 2016-11-29 (×2): qty 100

## 2016-11-29 MED ORDER — 0.9 % SODIUM CHLORIDE (POUR BTL) OPTIME
TOPICAL | Status: DC | PRN
  Administered 2016-11-29: 1000 mL

## 2016-11-29 MED ORDER — FLEET ENEMA 7-19 GM/118ML RE ENEM: 1.0000 | ENEMA | Freq: Once | RECTAL | Status: DC | PRN

## 2016-11-29 MED ORDER — METOCLOPRAMIDE HCL 5 MG PO TABS: 5.0000 mg | ORAL_TABLET | Freq: Three times a day (TID) | ORAL | Status: DC | PRN

## 2016-11-29 MED ORDER — METHOCARBAMOL 1000 MG/10ML IJ SOLN
500.0000 mg | Freq: Four times a day (QID) | INTRAVENOUS | Status: DC | PRN
  Filled 2016-11-29: qty 5

## 2016-11-29 MED ORDER — GABAPENTIN 600 MG PO TABS
1200.0000 mg | ORAL_TABLET | Freq: Three times a day (TID) | ORAL | Status: DC
  Administered 2016-11-30 – 2016-12-02 (×8): 1200 mg via ORAL
  Filled 2016-11-29 (×8): qty 2

## 2016-11-29 MED ORDER — METOPROLOL SUCCINATE ER 100 MG PO TB24
100.0000 mg | ORAL_TABLET | Freq: Every morning | ORAL | Status: DC
  Administered 2016-11-30: 100 mg via ORAL
  Filled 2016-11-29: qty 1

## 2016-11-29 MED ORDER — LEVOTHYROXINE SODIUM 50 MCG PO TABS
50.0000 ug | ORAL_TABLET | Freq: Every day | ORAL | Status: DC
  Administered 2016-11-30 – 2016-12-02 (×3): 50 ug via ORAL
  Filled 2016-11-29 (×3): qty 1

## 2016-11-29 MED ORDER — PANTOPRAZOLE SODIUM 40 MG PO TBEC
40.0000 mg | DELAYED_RELEASE_TABLET | Freq: Every day | ORAL | Status: DC
  Administered 2016-11-30 – 2016-12-02 (×3): 40 mg via ORAL
  Filled 2016-11-29 (×3): qty 1

## 2016-11-29 MED ORDER — DEXAMETHASONE SODIUM PHOSPHATE 10 MG/ML IJ SOLN
INTRAMUSCULAR | Status: AC
  Filled 2016-11-29: qty 1

## 2016-11-29 MED ORDER — PROPOFOL 500 MG/50ML IV EMUL
INTRAVENOUS | Status: DC | PRN
  Administered 2016-11-29: 50 ug/kg/min via INTRAVENOUS

## 2016-11-29 MED ORDER — ONDANSETRON HCL 4 MG PO TABS: 4.0000 mg | ORAL_TABLET | Freq: Four times a day (QID) | ORAL | Status: DC | PRN

## 2016-11-29 MED ORDER — FENTANYL CITRATE (PF) 100 MCG/2ML IJ SOLN
50.0000 ug | INTRAMUSCULAR | Status: DC | PRN
  Administered 2016-11-29: 50 ug via INTRAVENOUS
  Filled 2016-11-29: qty 2

## 2016-11-29 MED ORDER — HYDROMORPHONE HCL 1 MG/ML IJ SOLN
1.0000 mg | INTRAMUSCULAR | Status: DC | PRN
  Administered 2016-11-29 – 2016-12-02 (×2): 1 mg via INTRAVENOUS
  Filled 2016-11-29 (×2): qty 1

## 2016-11-29 SURGICAL SUPPLY — 55 items
BIT DRILL 4.3MMS DISTAL GRDTED (BIT) ×1 IMPLANT
BLADE SURG 15 STRL LF DISP TIS (BLADE) ×1 IMPLANT
BLADE SURG 15 STRL SS (BLADE) ×1
BNDG GAUZE ELAST 4 BULKY (GAUZE/BANDAGES/DRESSINGS) ×4 IMPLANT
COVER PERINEAL POST (MISCELLANEOUS) ×2 IMPLANT
COVER SURGICAL LIGHT HANDLE (MISCELLANEOUS) ×2 IMPLANT
DRAPE INCISE IOBAN 66X45 STRL (DRAPES) ×2 IMPLANT
DRAPE STERI IOBAN 125X83 (DRAPES) ×2 IMPLANT
DRILL 4.3MMS DISTAL GRADUATED (BIT) ×2
DRSG AQUACEL AG ADV 3.5X 6 (GAUZE/BANDAGES/DRESSINGS) ×2 IMPLANT
DRSG AQUACEL AG ADV 3.5X14 (GAUZE/BANDAGES/DRESSINGS) ×2 IMPLANT
DRSG MEPILEX BORDER 4X4 (GAUZE/BANDAGES/DRESSINGS) ×2 IMPLANT
DRSG MEPILEX BORDER 4X8 (GAUZE/BANDAGES/DRESSINGS) ×2 IMPLANT
DRSG PAD ABDOMINAL 8X10 ST (GAUZE/BANDAGES/DRESSINGS) ×4 IMPLANT
DURAPREP 26ML APPLICATOR (WOUND CARE) ×4 IMPLANT
ELECT REM PT RETURN 9FT ADLT (ELECTROSURGICAL) ×2
ELECTRODE REM PT RTRN 9FT ADLT (ELECTROSURGICAL) ×1 IMPLANT
FACESHIELD WRAPAROUND (MASK) IMPLANT
GAUZE XEROFORM 1X8 LF (GAUZE/BANDAGES/DRESSINGS) ×2 IMPLANT
GAUZE XEROFORM 5X9 LF (GAUZE/BANDAGES/DRESSINGS) ×2 IMPLANT
GLOVE BIO SURGEON STRL SZ8 (GLOVE) ×4 IMPLANT
GLOVE BIOGEL PI IND STRL 8 (GLOVE) ×2 IMPLANT
GLOVE BIOGEL PI INDICATOR 8 (GLOVE) ×2
GLOVE ORTHO TXT STRL SZ7.5 (GLOVE) ×4 IMPLANT
GOWN STRL REUS W/ TWL LRG LVL3 (GOWN DISPOSABLE) ×1 IMPLANT
GOWN STRL REUS W/ TWL XL LVL3 (GOWN DISPOSABLE) ×2 IMPLANT
GOWN STRL REUS W/TWL LRG LVL3 (GOWN DISPOSABLE) ×1
GOWN STRL REUS W/TWL XL LVL3 (GOWN DISPOSABLE) ×2
GUIDEPIN 3.2X17.5 THRD DISP (PIN) ×2 IMPLANT
HIP FRAC NAIL LAG SCR 10.5X100 (Orthopedic Implant) ×1 IMPLANT
HIP FRAC NAIL LEFT 11X360MM (Orthopedic Implant) ×2 IMPLANT
KIT BASIN OR (CUSTOM PROCEDURE TRAY) ×2 IMPLANT
KIT ROOM TURNOVER OR (KITS) ×2 IMPLANT
LINER BOOT UNIVERSAL DISP (MISCELLANEOUS) ×2 IMPLANT
MANIFOLD NEPTUNE II (INSTRUMENTS) ×2 IMPLANT
NAIL HIP FRAC LEFT 11X360MM (Orthopedic Implant) ×1 IMPLANT
NS IRRIG 1000ML POUR BTL (IV SOLUTION) ×2 IMPLANT
PACK GENERAL/GYN (CUSTOM PROCEDURE TRAY) ×2 IMPLANT
PAD ARMBOARD 7.5X6 YLW CONV (MISCELLANEOUS) ×4 IMPLANT
PAD CAST 4YDX4 CTTN HI CHSV (CAST SUPPLIES) ×2 IMPLANT
PADDING CAST COTTON 4X4 STRL (CAST SUPPLIES) ×2
PIN GUIDE 3.2 903003004 (MISCELLANEOUS) ×2 IMPLANT
SCREW BONE CORTICAL 5.0X50 (Screw) ×2 IMPLANT
SCREW CANN THRD AFF 10.5X100 (Orthopedic Implant) ×1 IMPLANT
SCREWDRIVER HEX TIP 3.5MM (MISCELLANEOUS) ×2 IMPLANT
SPONGE LAP 18X18 X RAY DECT (DISPOSABLE) ×2 IMPLANT
STAPLER SKIN 35 REG (STAPLE) ×2 IMPLANT
STAPLER VISISTAT 35W (STAPLE) ×2 IMPLANT
SUT VIC AB 0 CT1 27 (SUTURE) ×3
SUT VIC AB 0 CT1 27XBRD ANBCTR (SUTURE) ×3 IMPLANT
SUT VIC AB 2-0 CT1 27 (SUTURE) ×3
SUT VIC AB 2-0 CT1 TAPERPNT 27 (SUTURE) ×3 IMPLANT
TOWEL OR 17X24 6PK STRL BLUE (TOWEL DISPOSABLE) ×2 IMPLANT
TOWEL OR 17X26 10 PK STRL BLUE (TOWEL DISPOSABLE) ×2 IMPLANT
WATER STERILE IRR 1000ML POUR (IV SOLUTION) ×2 IMPLANT

## 2016-11-29 NOTE — Anesthesia Postprocedure Evaluation (Signed)
Anesthesia Post Note  Patient: [name]  Procedure(s) Performed: Procedure(s) (LRB): INTRAMEDULLARY (IM) NAIL INTERTROCHANTRIC (Left)     Patient location during evaluation: PACU Anesthesia Type: Spinal Level of consciousness: oriented and awake and alert Pain management: pain level controlled Vital Signs Assessment: post-procedure vital signs reviewed and stable Respiratory status: spontaneous breathing, respiratory function stable and patient connected to nasal cannula oxygen Cardiovascular status: blood pressure returned to baseline and stable Postop Assessment: no headache and no backache Anesthetic complications: no    Last Vitals:  Vitals:   11/29/16 1610 11/29/16 1615  BP: (!) 89/70 104/74  Pulse: 84 84  Resp: 13 12  Temp:      Last Pain:  Vitals:   11/29/16 0930  TempSrc:   PainSc: 6                  Malek Skog DAVID

## 2016-11-29 NOTE — Progress Notes (Signed)
Almyra Free, CRNA notified that patient has not taken BB.

## 2016-11-29 NOTE — Brief Op Note (Signed)
11/29/2016  1:53 PM  PATIENT:  [name]  75 y.o. female  PRE-OPERATIVE DIAGNOSIS:  left intertroch hip fx  POST-OPERATIVE DIAGNOSIS:  left intertroch hip fx  PROCEDURE:  Procedure(s): INTRAMEDULLARY (IM) NAIL INTERTROCHANTRIC (Left)  SURGEON:  Surgeon(s) and Role:    * [name], MD - Primary  PHYSICIAN ASSISTANT: Benita Stabile, PA-C  ANESTHESIA: spinal  EBL:  Total I/O In: 1000 [I.V.:1000] Out: 500 [Blood:500]  COUNTS:  YES  DICTATION: .Other Dictation: Dictation Number [id number]  PLAN OF CARE: Admit to inpatient   PATIENT DISPOSITION:  PACU - hemodynamically stable.   Delay start of Pharmacological VTE agent (>24hrs) due to surgical blood loss or risk of bleeding: yes

## 2016-11-29 NOTE — Op Note (Signed)
[name], [name] NO.:  [id number]  MEDICAL RECORD NO.:  [id number]  LOCATION:  A05C                         FACILITY:  [facility]  PHYSICIAN:  [name], M.D.DATE OF BIRTH:  June 08, 1940  DATE OF PROCEDURE:  11/29/2016 DATE OF DISCHARGE:                              OPERATIVE REPORT   PREOPERATIVE DIAGNOSIS:  Left comminuted proximal femur/intertrochanteric/subtrochanteric femur fracture.  POSTOPERATIVE DIAGNOSIS:  Left comminuted proximal femur/intertrochanteric/subtrochanteric femur fracture.  PROCEDURE:  Open reduction and internal fixation of left intertrochanteric/subtrochanteric femur using intramedullary rod and hip screw construct.  IMPLANTS:  Biomet AFFIXUS trochanteric nail measuring 11 x 360 with 100- mm lag screw and one distal interlocking screw.  SURGEON:  [name], M.D.  ASSISTANT:  Erskine Emery, PA-C.  ANESTHESIA:  Spinal.  ANTIBIOTICS:  3 g of IV Ancef.  BLOOD LOSS:  300 mL.  COMPLICATIONS:  None.  INDICATIONS:  [name] is a very pleasant 76 year old female, who is morbidly obese.  She had mechanical fall accidentally earlier today injuring her left hip.  She was seen at the [facility] Emergency Room and found to have a left intertrochanteric hip fracture.  She is someone who does have some disease of her lungs.  She was admitted to the Hospitalist Service and cleared for surgery.  She was seen by Anesthesia as well and felt that spinal anesthesia would be appropriate.  I had a long and thorough discussion with she and her family about the need for surgery based on this type of fracture.  I explained them in detail the risk of acute blood loss anemia, nerve and vessel injury, hardware failure and infection with all these risks being heightened given her obesity.  PROCEDURE DESCRIPTION:  After informed consent was obtained, appropriate left hip was marked.  She was brought to the operating room,  sedation was given.  She was set up, so spinal anesthesia could be obtained.  She was then laid supine on the fracture table with the perineal post in place.  Her right nonoperative hip flexed and abducted out of the field with well-padded leg holder and then the left operative leg was placed in inline skeletal traction device using a traction boot.  We were able to pull traction and internal rotation and get the fractures adequately reduced as possible we verified this under direct fluoroscopy.  We then actually chose our femoral nail choosing a left 11 mm x 360 cm femoral nail keeping it sterile in the box, we passed this off the side table once we figured out the rod we were going to use.  It was opened up sterilely.  We then prepped her left hip down to her pastor knee with DuraPrep and sterile drapes.  Time-out was called, she was identified as correct patient and correct left hip fracture.  We then made a wide incision over the proximal femur laterally and dissected down to the tip of greater trochanter.  I placed temporary guidepin under direct fluoroscopy from the tip of greater trochanter down to the level of lesser trochanter.  I then used an initiating reamer to open up the femoral canal.  We were then able to remove the temporary guidepin and  place the femoral nail down the leg without having to ream.  Using the outrigger guide, we made a separate lateral incision, we were able to drill a temporary guidepin traversing the fracture into femoral head and neck region.  We were pleased with position of this, so we took a measurement off this and drilled for the depth of a 100-mm lag screw. We placed that screw without difficulty and placed the compression component letting some traction off the leg.  We then locked the screw from the top.  We made a separate incision distally from lateral to medial to place one interlocking screw.  We then irrigated all wounds and closed the deep  tissue with 0 Vicryl followed by 2-0 Vicryl in the subcutaneous tissue, interrupted staples on all skin incisions. Xeroform and well-padded sterile dressing were applied.  She was then taken off the fracture table and placed on the stretcher.  Foley catheter was placed.  She was taken to the recovery room in stable condition.  All final counts were correct.  There were no complications noted.  Of note, Erskine Emery, PA-C, assisted in the entire case, his assistance was crucial for facilitating all aspects of this case.     [name], M.D.     CYB/MEDQ  D:  11/29/2016  T:  11/29/2016  Job:  [id number]

## 2016-11-29 NOTE — ED Triage Notes (Signed)
Patient from home with [facility] after fall due to dizziness.  Patient states she fell onto her left hip, denies hitting head, denies head pain and neck pain.  Alert and oriented at this time.  Pelvis bound with sheet from EMS, patient states this binding has reduced pain.

## 2016-11-29 NOTE — ED Provider Notes (Signed)
[facility] DEPT Provider Note   CSN: [id number] Arrival date & time: 11/29/16  0758     History   Chief Complaint Chief Complaint  Patient presents with  . Fall    HPI Marni F Vierling is a 76 y.o. female.  Patient is a 76 year old female with a history of COPD, diabetes, hypertension, hyperlipidemia and rheumatoid arthritis on prednisone who presents with hip pain after a fall. She states she was getting out of bed this morning. She was standing beside the bed and taking a couple steps when she fell over onto her left side. She complains of pain to her left hip. She denies any other injuries. She denies hitting her head. She denies any neck or back pain. She denies any dizziness prior to the fall. No chest pain or shortness of breath. No palpitations. She is not on anticoagulants.      Past Medical History:  Diagnosis Date  . Arthritis    endstage changes bilateral knees/bilateral ankles.   . Asthma   . Carotid artery occlusion   . Chronic kidney disease   . Clotting disorder ([place])    pt denies this  . Contusion of left knee    due to fall 1/14.  [name] COPD (chronic obstructive pulmonary disease) (HCC)    pulmonary fibrosis  . Depression, reactive   . Diabetes mellitus   . Diastolic dysfunction   . Family history of heart disease   . High cholesterol   . Hypertension   . Interstitial lung disease ([place])   . 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    . Morbid obesity ([place])   . Normal coronary arteries    cardiac catheterization performed  10/31/14  . RA (rheumatoid arthritis) ([place])    has been off methotreaxte since 10/13.  [name] Shortness of breath   . Thyroid disease   . URI (upper respiratory infection)     Patient Active Problem List   Diagnosis Date Noted  . Gout 11/29/2016  . Depression 11/29/2016  . Closed left hip fracture ([place]) 11/29/2016  . GERD (gastroesophageal  reflux disease) 11/29/2016  . Closed intertrochanteric fracture of hip, left, initial encounter ([place]) 11/29/2016  . Physical deconditioning 07/31/2015  . Pulmonary fibrosis, postinflammatory ([place]) 07/31/2015  . Tendinitis of left rotator cuff 06/13/2015  . Acute on chronic diastolic heart failure ([place]) 04/21/2015  . Acute pulmonary edema (HCC)   . AKI (acute kidney injury) ([place])   . Peripheral edema   . Acute on chronic respiratory failure with hypoxia ([place]) 04/17/2015  . CAP (community acquired pneumonia) 04/17/2015  . Acute kidney injury superimposed on chronic kidney disease ([place]) 04/17/2015  . Hypotension 04/17/2015  . Chronic respiratory failure with hypoxia ([place]) 02/09/2015  . Dyspnea and respiratory abnormality 01/03/2015  . Normal coronary arteries 11/02/2014  . Morbid obesity ([place]) 10/30/2014  . Dysphagia 08/21/2014  . Sore throat 07/18/2014  . Chronic fatigue 06/21/2014  . DOE (dyspnea on exertion) 05/24/2014  . Primary osteoarthritis of left knee 04/26/2014  . High risk medication use 04/17/2014  . Aphasia 02/12/2014  . 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 ([place]) 01/18/2014  . Lumbar facet arthropathy ([place]) 01/18/2014  . Acute bronchitis 07/24/2013  . Bilateral edema of lower extremity 05/30/2013  . Chronic cough 05/24/2013  . ILD (interstitial lung disease) ([place]) 04/10/2013  . Greater trochanteric bursitis of left hip 08/13/2012  . Spinal stenosis of lumbar region with  radiculopathy 07/12/2012  . Inability to walk 07/12/2012  . Acute lumbar back pain 07/12/2012  . Asymptomatic bacteriuria 07/12/2012  . 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 ([place]) 02/17/2012  . Benign neoplasm of meninges ([place]) 02/12/2012  . Brain mass 01/28/2012  . Chest pain with moderate risk of acute coronary syndrome 01/28/2012  . Rheumatoid arthritis ([place]) 01/28/2012  . Primary osteoarthritis of right knee 01/28/2012  . Dyslipidemia   . Thyroid disease   .  Essential hypertension     Past Surgical History:  Procedure Laterality Date  . ABDOMINAL HYSTERECTOMY    . BRAIN SURGERY     Gamma knife 10/13. Needs repeat spring  '14  . CARDIAC CATHETERIZATION N/A 10/31/2014   Procedure: Right/Left Heart Cath and Coronary Angiography;  Surgeon: [name], MD;  Location: [facility];  Service: Cardiovascular;  Laterality: N/A;  . ESOPHAGOGASTRODUODENOSCOPY (EGD) WITH PROPOFOL N/A 09/14/2014   Procedure: ESOPHAGOGASTRODUODENOSCOPY (EGD) WITH PROPOFOL;  Surgeon: [name], MD;  Location: [facility];  Service: Endoscopy;  Laterality: N/A;  . OVARY SURGERY    . SHOULDER SURGERY Left   . TONSILLECTOMY  age 30  . VIDEO BRONCHOSCOPY Bilateral 05/31/2013   Procedure: VIDEO BRONCHOSCOPY WITHOUT FLUORO;  Surgeon: Brand Males, MD;  Location: [facility];  Service: Cardiopulmonary;  Laterality: Bilateral;  . video bronscoscopy  2000   lung    OB History    No data available       Home Medications    Prior to Admission medications   Medication Sig Start Date End Date Taking? Authorizing Provider  acetaminophen (TYLENOL) 500 MG tablet Take 1,000 mg by mouth every 6 (six) hours as needed for moderate pain.    Yes [provider]  albuterol (PROVENTIL HFA;VENTOLIN HFA) 108 (90 BASE) MCG/ACT inhaler Inhale 2 puffs into the lungs every 6 (six) hours as needed for wheezing or shortness of breath.   Yes [provider]  allopurinol (ZYLOPRIM) 300 MG tablet Take 300 mg by mouth daily.  01/28/16 01/27/17 Yes [provider]  aspirin EC 81 MG tablet Take 81 mg by mouth every morning.    Yes [provider]  atorvastatin (LIPITOR) 20 MG tablet Take 10 mg by mouth every evening.    Yes [provider]  azithromycin (ZITHROMAX) 500 MG tablet Take 1 tablet by mouth daily. TAKE ON MON, WED & FRI 09/20/15  Yes [provider]  Calcium Carb-Cholecalciferol (CALCIUM + D3 PO) Take 2 tablets by  mouth daily with lunch.    Yes [provider]  cholecalciferol (VITAMIN D) 1000 UNITS tablet Take 2,000 Units by mouth daily with lunch.    Yes [provider]  cyclobenzaprine (FLEXERIL) 5 MG tablet Take 1 tablet (5 mg total) by mouth at bedtime as needed for muscle spasms. 06/17/16  Yes [name], NP  diclofenac sodium (VOLTAREN) 1 % GEL Apply 4 g topically 3 (three) times daily.   Yes [provider]  DULoxetine (CYMBALTA) 60 MG capsule Take 1 capsule (60 mg total) by mouth daily. 07/14/16  Yes [name], NP  fluticasone (FLONASE) 50 MCG/ACT nasal spray Place 2 sprays into both nostrils daily. 02/18/16  Yes Brand Males, MD  furosemide (LASIX) 20 MG tablet Take 20 mg by mouth daily.  04/25/16  Yes [provider]  gabapentin (NEURONTIN) 600 MG tablet Take two in the morning , Two mid- afternoon and two at bedtime Patient taking differently: Take 1,200 mg by mouth  3 (three) times daily.  11/20/16  Yes Meredith Staggers, MD  guaiFENesin (MUCINEX) 600 MG 12 hr tablet Take 1,200 mg by mouth 2 (two) times daily.   Yes [provider]  hydroxychloroquine (PLAQUENIL) 200 MG tablet Take 200 mg by mouth daily.   Yes [provider]  levothyroxine (SYNTHROID, LEVOTHROID) 50 MCG tablet Take 1 tablet (50 mcg total) by mouth daily. 07/23/12  Yes Love, Ivan Anchors, PA-C  metoprolol succinate (TOPROL-XL) 100 MG 24 hr tablet Take 100 mg by mouth every morning. Take with or immediately following a meal.   Yes [provider]  morphine (MS CONTIN) 15 MG 12 hr tablet Take 1 tablet (15 mg total) by mouth every 12 (twelve) hours. 11/28/16  Yes [name], NP  morphine (MSIR) 15 MG tablet Take 1 tablet (15 mg total) by mouth every 8 (eight) hours as needed for severe pain. 11/28/16  Yes [name], NP  Multiple Vitamin (MULTIVITAMIN WITH MINERALS) TABS Take 1 tablet by mouth daily with lunch.    Yes [provider]    olmesartan-hydrochlorothiazide (BENICAR HCT) 40-25 MG tablet Take 1 tablet by mouth daily.  05/04/14  Yes [provider]  OXYGEN-HELIUM IN Inhale 2 L into the lungs at bedtime. And on exertion   Yes [provider]  pantoprazole (PROTONIX) 40 MG tablet take 1 tablet by mouth once daily MINUTES BEFORE 1ST MEAL OF THE DAY 11/03/16  Yes Brand Males, MD  predniSONE (DELTASONE) 10 MG tablet Take 10 mg by mouth every morning. Alternating 56m and 7.529m2/1/17  Yes [provider]  sodium chloride (OCEAN) 0.65 % SOLN nasal spray Place 1 spray into both nostrils as needed for congestion.   Yes [provider]  Spacer/Aero-Holding Chambers (AEROCHAMBER MV) inhaler Use as instructed 06/10/13  Yes Parrett, Tammy S, NP  VICTOZA 18 MG/3ML SOPN Inject 1.8 mg as directed daily.  01/26/15  Yes [provider]    Family History Family History  Problem Relation Age of Onset  . Diabetes Mother   . Heart attack Mother   . Hypertension Father   . Lung cancer Father   . Diabetes Sister   . Diabetes Brother   . Hypertension Brother   . Heart disease Brother   . Heart attack Brother   . Kidney cancer Brother   . Uterine cancer Daughter   . Breast cancer Sister   . Rheum arthritis Maternal Uncle   . Gout Brother   . Kidney failure Brother        x 5    Social History Social History  Substance Use Topics  . Smoking status: Never Smoker  . Smokeless tobacco: Never Used  . Alcohol use No     Allergies   Codeine; Remicade [infliximab]; and Zestril [lisinopril]   Review of Systems Review of Systems  Constitutional: Negative for activity change, appetite change and fever.  HENT: Negative for dental problem, nosebleeds and trouble swallowing.   Eyes: Negative for pain and visual disturbance.  Respiratory: Negative for shortness of breath.   Cardiovascular: Negative for chest pain.  Gastrointestinal: Negative for abdominal pain, nausea and vomiting.   Genitourinary: Negative for dysuria and hematuria.  Musculoskeletal: Positive for arthralgias and back pain (Hassan chronic back pain related to spinal stenosis but denies any current back pain related to the fall). Negative for joint swelling and neck pain.  Skin: Negative for wound.  Neurological: Negative for weakness, numbness and headaches.  Psychiatric/Behavioral: Negative for  confusion.     Physical Exam Updated Vital Signs BP 102/70   Pulse 94   Temp 97.7 F (36.5 C) (Oral)   Resp 17   Ht _0  (1.626 m)   Wt 124.3 kg (274 lb)   SpO2 93%   BMI 47.03 kg/m   Physical Exam  Constitutional: She is oriented to person, place, and time. She appears well-developed and well-nourished.  Obese  HENT:  Head: Normocephalic and atraumatic.  Nose: Nose normal.  No hemotympanum  Eyes: Pupils are equal, round, and reactive to light. Conjunctivae are normal.  Neck:  No pain to the cervical, thoracic, or LS spine.  No step-offs or deformities noted  Cardiovascular: Normal rate and regular rhythm.   No murmur heard. No evidence of external trauma to the chest or abdomen  Pulmonary/Chest: Effort normal and breath sounds normal. No respiratory distress. She has no wheezes. She exhibits no tenderness.  Abdominal: Soft. Bowel sounds are normal. She exhibits no distension. There is no tenderness.  Musculoskeletal: Normal range of motion.  Patient has positive pain to her left hip with some external rotation of the leg. There is pain to the left hip. There is no pain to the knee or ankle. Pedal pulses are intact. She has normal sensation in the leg. No other pain on palpation or range of motion extremities.  Neurological: She is alert and oriented to person, place, and time.  Skin: Skin is warm and dry. Capillary refill takes less than 2 seconds.  Psychiatric: She has a normal mood and affect.  Vitals reviewed.    ED Treatments / Results  Labs (all labs ordered are listed, but only  abnormal results are displayed) Labs Reviewed  BASIC METABOLIC PANEL - Abnormal; Notable for the following:       Result Value   Potassium 3.1 (*)    Chloride 96 (*)    Glucose, Bld 113 (*)    BUN 21 (*)    Creatinine, Ser 1.36 (*)    Calcium 8.5 (*)    GFR calc non Af Amer 37 (*)    GFR calc Af Amer 43 (*)    All other components within normal limits  CBC WITH DIFFERENTIAL/PLATELET - Abnormal; Notable for the following:    WBC 11.1 (*)    Hemoglobin 11.6 (*)    RDW 16.3 (*)    All other components within normal limits  PROTIME-INR  URINALYSIS, ROUTINE W REFLEX MICROSCOPIC  APTT  HEMOGLOBIN A1C  TYPE AND SCREEN  ABO/RH    EKG  EKG Interpretation  Date/Time:  Saturday November 29 2016 08:06:38 EDT Ventricular Rate:  81 PR Interval:    QRS Duration: 97 QT Interval:  415 QTC Calculation: 482 R Axis:   6 Text Interpretation:  Sinus rhythm Abnormal R-wave progression, early transition Baseline wander in lead(s) I II aVR aVL Confirmed by Malvin Johns [phone]) on 11/29/2016 8:10:10 AM       Radiology Dg Chest Port 1 View  Result Date: 11/29/2016 CLINICAL DATA:  Left hip fracture.  Preop evaluation. EXAM: PORTABLE CHEST 1 VIEW COMPARISON:  07/30/2016. FINDINGS: Interval borderline enlarged cardiac silhouette, magnified by the portable AP technique. Clear lungs with normal vascularity. Unremarkable bones. IMPRESSION: Borderline cardiomegaly.  No acute abnormality. Electronically Signed   By: Claudie Revering M.D.   On: 11/29/2016 09:23   Dg Hip Unilat With Pelvis 2-3 Views Left  Result Date: 11/29/2016 CLINICAL DATA:  Left hip pain radiating into the left leg after falling this  morning and landing on the hip. EXAM: DG HIP (WITH OR WITHOUT PELVIS) 2-3V LEFT COMPARISON:  None. FINDINGS: Comminuted left intertrochanteric fracture with mild anterior displacement of the distal fragment. No significant angulation. IMPRESSION: Comminuted left intertrochanteric fracture. Electronically Signed    By: Claudie Revering M.D.   On: 11/29/2016 08:51    Procedures Procedures (including critical care time)  Medications Ordered in ED Medications  0.9 %  sodium chloride infusion ( Intravenous New Bag/Given 11/29/16 0931)  gabapentin (NEURONTIN) tablet 1,200 mg (not administered)  pantoprazole (PROTONIX) EC tablet 40 mg (not administered)  DULoxetine (CYMBALTA) DR capsule 60 mg (not administered)  furosemide (LASIX) tablet 20 mg (not administered)  fluticasone (FLONASE) 50 MCG/ACT nasal spray 2 spray (not administered)  hydroxychloroquine (PLAQUENIL) tablet 200 mg (not administered)  allopurinol (ZYLOPRIM) tablet 300 mg (not administered)  azithromycin (ZITHROMAX) tablet 500 mg (not administered)  olmesartan-hydrochlorothiazide (BENICAR HCT) 40-25 MG per tablet 1 tablet (not administered)  predniSONE (DELTASONE) tablet 10 mg (not administered)  guaiFENesin (MUCINEX) 12 hr tablet 1,200 mg (not administered)  aspirin EC tablet 81 mg (not administered)  levothyroxine (SYNTHROID, LEVOTHROID) tablet 50 mcg (not administered)  atorvastatin (LIPITOR) tablet 10 mg (not administered)  metoprolol succinate (TOPROL-XL) 24 hr tablet 100 mg (not administered)  sodium phosphate (FLEET) 7-19 GM/118ML enema 1 enema (not administered)  insulin aspart (novoLOG) injection 0-9 Units (not administered)  fentaNYL (SUBLIMAZE) injection 50 mcg (not administered)  fentaNYL (SUBLIMAZE) injection 50 mcg (50 mcg Intravenous Given 11/29/16 1036)     Initial Impression / Assessment and Plan / ED Course  I have reviewed the triage vital signs and the nursing notes.  Pertinent labs & imaging results that were available during my care of the patient were reviewed by me and considered in my medical decision making (see chart for details).     Patient presents after a fall. The etiology of the fall is unclear although she did not have any symptoms prior to the fall.  Her EKG does not demonstrate any arrhythmias. Her  chest x-ray is clear. She does have evidence of a left intertrochanteric hip fracture. I spoke with Dr. Ninfa Linden who will take the patient to the OR for surgical repair. He requests medicine admission. I spoke with Sherrilyn Rist with the hospitalist service who will admit the patient and evaluate her prior to surgery.  Final Clinical Impressions(s) / ED Diagnoses   Final diagnoses:  Closed left hip fracture, initial encounter [facility])  Fall, initial encounter    New Prescriptions New Prescriptions   No medications on file     Malvin Johns, MD 11/29/16 1055

## 2016-11-29 NOTE — Anesthesia Procedure Notes (Signed)
Spinal  Patient location during procedure: OR Start time: 11/29/2016 12:20 PM End time: 11/29/2016 12:30 PM Staffing Anesthesiologist: Lillia Abed Performed: anesthesiologist  Preanesthetic Checklist Completed: patient identified, surgical consent, pre-op evaluation, timeout performed, IV checked, risks and benefits discussed and monitors and equipment checked Spinal Block Patient position: sitting Prep: DuraPrep Patient monitoring: heart rate, cardiac monitor, continuous pulse ox and blood pressure Approach: right paramedian Location: L3-4 Injection technique: single-shot Needle Needle type: Sprotte  Needle gauge: 25 G Needle length: 12.7 cm Needle insertion depth: 10 cm

## 2016-11-29 NOTE — H&P (Signed)
History and Physical    [name] EXB:[id number] DOB: June 26, 1940 DOA: 11/29/2016   PCP: Glendale Chard, MD   Patient coming from:  Home    Chief Complaint: Fall   HPI: [name] is a 76 y.o. female  with multiple medical issues listed below, presenting today after sustaining  a fall, with subsequent hip pain. The patient was getting out of bed this morning, felt dizzy as she got up very quickly, then sustaining mechanical fall as she was leaning over to the left. She denies any other injuries. She denies hitting her head. No LOC. Denies any blurred vision. She denies any neck pain. The patient has chronic back pain related to spinal stenosis, but non-related to these fall. She denies any syncope or presyncope. No confusion is reported. She denies any chest pain or worsening shortness of breath. She is on home O2 prn. She denies any palpitations. The patient is not on anticoagulants. Orthopedics is involved and is to proceed with surgery today    ED Course:  BP 102/70   Pulse 94   Temp 97.7 F (36.5 C) (Oral)   Resp 17   Ht _0  (1.626 m)   Wt 124.3 kg (274 lb)   SpO2 93%   BMI 47.03 kg/m    EKG with sinus rhythm Urine  WBC 11.1  Cr 1.35 chest x-ray without acute abnormality received Fentanyl  IV PRN currently on IV fluids at 125 mL in our L pelvis x-ray closed intertrochanteric fracture  2 D echo 1027 Systolic function was normal. EF  55% to 60%.   grade 1 diastolic dysfunction   Review of Systems:  As per HPI otherwise all other systems reviewed and are negative  Past Medical History:  Diagnosis Date  . Arthritis    endstage changes bilateral knees/bilateral ankles.   . Asthma   . Carotid artery occlusion   . Chronic kidney disease   . Clotting disorder ([place])    pt denies this  . Contusion of left knee    due to fall 1/14.  [name] COPD (chronic obstructive pulmonary disease) (HCC)    pulmonary fibrosis  . Depression, reactive   . Diabetes mellitus   .  Diastolic dysfunction   . Family history of heart disease   . High cholesterol   . Hypertension   . Interstitial lung disease ([place])   . 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    . Morbid obesity ([place])   . Normal coronary arteries    cardiac catheterization performed  10/31/14  . RA (rheumatoid arthritis) ([place])    has been off methotreaxte since 10/13.  [name] Shortness of breath   . Thyroid disease   . URI (upper respiratory infection)     Past Surgical History:  Procedure Laterality Date  . ABDOMINAL HYSTERECTOMY    . BRAIN SURGERY     Gamma knife 10/13. Needs repeat spring  '14  . CARDIAC CATHETERIZATION N/A 10/31/2014   Procedure: Right/Left Heart Cath and Coronary Angiography;  Surgeon: [name], MD;  Location: [place] [facility];  Service: Cardiovascular;  Laterality: N/A;  . ESOPHAGOGASTRODUODENOSCOPY (EGD) WITH PROPOFOL N/A 09/14/2014   Procedure: ESOPHAGOGASTRODUODENOSCOPY (EGD) WITH PROPOFOL;  Surgeon: [name], MD;  [facility];  Service: Endoscopy;  Laterality: N/A;  . OVARY SURGERY    . SHOULDER SURGERY Left   . TONSILLECTOMY  age 2  .  VIDEO BRONCHOSCOPY Bilateral 05/31/2013   Procedure: VIDEO BRONCHOSCOPY WITHOUT FLUORO;  Surgeon: Brand Males, MD;  Location: [place];  Service: Cardiopulmonary;  Laterality: Bilateral;  . video bronscoscopy  2000   lung    Social History Social History   Social History  . Marital status: Married    Spouse name: N/A  . Number of children: 6  . Years of education: college   Occupational History  . retired Marine scientist    Social History Main Topics  . Smoking status: Never Smoker  . Smokeless tobacco: Never Used  . Alcohol use No  . Drug use: No  . Sexual activity: Not Currently   Other Topics Concern  . Not on file   Social History Narrative   Patient consumes 2-3 cups coffee per day.     Allergies    Allergen Reactions  . Codeine Swelling    Facial swelling  . Remicade [Infliximab] Anaphylaxis    "sent me into shock"  . Zestril [Lisinopril] Swelling    Face and neck swelling    Family History  Problem Relation Age of Onset  . Diabetes Mother   . Heart attack Mother   . Hypertension Father   . Lung cancer Father   . Diabetes Sister   . Diabetes Brother   . Hypertension Brother   . Heart disease Brother   . Heart attack Brother   . Kidney cancer Brother   . Uterine cancer Daughter   . Breast cancer Sister   . Rheum arthritis Maternal Uncle   . Gout Brother   . Kidney failure Brother        x 5      Prior to Admission medications   Medication Sig Start Date End Date Taking? Authorizing Provider  ACCU-CHEK SMARTVIEW test strip 1 each by Other route every morning.  03/18/14   [provider]  acetaminophen (TYLENOL) 500 MG tablet Take 1,000 mg by mouth every 6 (six) hours as needed for moderate pain.     [provider]  albuterol (PROVENTIL HFA;VENTOLIN HFA) 108 (90 BASE) MCG/ACT inhaler Inhale 2 puffs into the lungs every 6 (six) hours as needed for wheezing or shortness of breath.    [provider]  allopurinol (ZYLOPRIM) 300 MG tablet Take 300 mg by mouth daily.  01/28/16 01/27/17  [provider]  aspirin EC 81 MG tablet Take 81 mg by mouth every morning.     [provider]  atorvastatin (LIPITOR) 20 MG tablet Take 10 mg by mouth every evening.     [provider]  azithromycin (ZITHROMAX) 500 MG tablet Take 1 tablet by mouth daily. TAKE ON MON, WED & FRI 09/20/15   [provider]  Calcium Carb-Cholecalciferol (CALCIUM + D3 PO) Take 2 tablets by mouth daily with lunch.     [provider]  cholecalciferol (VITAMIN D) 1000 UNITS tablet Take 2,000 Units by mouth daily with lunch.     [provider]  cyclobenzaprine (FLEXERIL) 5 MG tablet Take 1 tablet (5 mg total) by mouth at bedtime as  needed for muscle spasms. 06/17/16   [name], NP  DULoxetine (CYMBALTA) 60 MG capsule Take 1 capsule (60 mg total) by mouth daily. 07/14/16   [name], NP  fluticasone (FLONASE) 50 MCG/ACT nasal spray Place 2 sprays into both nostrils daily. 02/18/16   Brand Males, MD  furosemide (LASIX) 20 MG tablet Take 20 mg by mouth daily.  04/25/16   [provider]  gabapentin (NEURONTIN) 600 MG tablet Take two in the morning , Two mid- afternoon and two at bedtime 11/20/16   Meredith Staggers, MD  guaiFENesin (MUCINEX) 600 MG 12 hr tablet Take 1,200 mg by mouth 2 (two) times daily.    [provider]  hydroxychloroquine (PLAQUENIL) 200 MG tablet Take 200 mg by mouth daily.    [provider]  levothyroxine (SYNTHROID, LEVOTHROID) 50 MCG tablet Take 1 tablet (50 mcg total) by mouth daily. 07/23/12   Love, Ivan Anchors, PA-C  metoprolol succinate (TOPROL-XL) 100 MG 24 hr tablet Take 100 mg by mouth every morning. Take with or immediately following a meal.    [provider]  morphine (MS CONTIN) 15 MG 12 hr tablet Take 1 tablet (15 mg total) by mouth every 12 (twelve) hours. 11/28/16   [name], NP  morphine (MSIR) 15 MG tablet Take 1 tablet (15 mg total) by mouth every 8 (eight) hours as needed for severe pain. 11/28/16   [name], NP  Multiple Vitamin (MULTIVITAMIN WITH MINERALS) TABS Take 1 tablet by mouth daily with lunch.     [provider]  olmesartan-hydrochlorothiazide (BENICAR HCT) 40-25 MG tablet Take 1 tablet by mouth daily.  05/04/14   [provider]  OXYGEN-HELIUM IN Inhale 2 L into the lungs at bedtime. And on exertion    [provider]  pantoprazole (PROTONIX) 40 MG tablet take 1 tablet by mouth once daily MINUTES BEFORE 1ST MEAL OF THE DAY 11/03/16   Brand Males, MD  prednisoLONE acetate (PRED FORTE) 1 % ophthalmic suspension Place 1 drop into both eyes every 4 hours. 07/29/16   [provider]   predniSONE (DELTASONE) 10 MG tablet Take 10 mg by mouth every morning. Alternating 79m and 7.563m2/1/17   [provider]  sodium chloride (OCEAN) 0.65 % SOLN nasal spray Place 1 spray into both nostrils as needed for congestion.    [provider]  Spacer/Aero-Holding Chambers (AEROCHAMBER MV) inhaler Use as instructed 06/10/13   Parrett, TaFonnie MuNP  valACYclovir (VALTREX) 500 MG tablet  09/15/16   [provider]  VICTOZA 18 MG/3ML SOPN Inject 1.8 mg as directed daily.  01/26/15   [provider]    Physical Exam:  Vitals:   11/29/16 0845 11/29/16 0915 11/29/16 0945 11/29/16 1030  BP: (!) 109/59 137/76 105/83 102/70  Pulse: 89 88 90 94  Resp: _0 Temp:      TempSrc:      SpO2: 92% 95% 91% 93%  Weight:      Height:       Constitutional: NAD, calm, comfortable   Eyes: PERRL, lids and conjunctivae normal ENMT: Mucous membranes are moist, without exudate or lesions  Neck: normal, supple, no masses, no thyromegaly Respiratory:  Essentially clear to auscultation bilaterally, chronic atelectatic sounds , no wheezing, no crackles. Normal respiratory effort  Cardiovascular: Regular rate and rhythm, no murmurs, rubs or gallops. No extremity edema. 2+ pedal pulses. No carotid bruits.  Abdomen: Soft, morbidly obese, non tender, No hepatosplenomegaly. Bowel sounds positive.  Musculoskeletal: no clubbing / cyanosis. Some swelling at the LLE, especially at the ankle  Skin: no jaundice, No lesions.  Neurologic: Sensation intact  Strength equal in all extremities. Able to move her toes  Psychiatric:   Alert and oriented x 3. Normal mood.     Labs on Admission: I have personally reviewed following labs and imaging studies  CBC:  Recent Labs Lab  11/29/16 0928  WBC 11.1*  NEUTROABS 6.1  HGB 11.6*  HCT 37.7  MCV 96.4  PLT 130    Basic Metabolic Panel:  Recent Labs Lab 11/29/16 0928  NA 139  K 3.1*  CL 96*  CO2 32  GLUCOSE 113*  BUN  21*  CREATININE 1.36*  CALCIUM 8.5*    GFR: Estimated Creatinine Clearance: 46.5 mL/min (A) (by C-G formula based on SCr of 1.36 mg/dL (H)).  Liver Function Tests: No results for input(s): AST, ALT, ALKPHOS, BILITOT, PROT, ALBUMIN in the last 168 hours. No results for input(s): LIPASE, AMYLASE in the last 168 hours. No results for input(s): AMMONIA in the last 168 hours.  Coagulation Profile:  Recent Labs Lab 11/29/16 0928  INR 1.05    Cardiac Enzymes: No results for input(s): CKTOTAL, CKMB, CKMBINDEX, TROPONINI in the last 168 hours.  BNP (last 3 results) No results for input(s): PROBNP in the last 8760 hours.  HbA1C: No results for input(s): HGBA1C in the last 72 hours.  CBG: No results for input(s): GLUCAP in the last 168 hours.  Lipid Profile: No results for input(s): CHOL, HDL, LDLCALC, TRIG, CHOLHDL, LDLDIRECT in the last 72 hours.  Thyroid Function Tests: No results for input(s): TSH, T4TOTAL, FREET4, T3FREE, THYROIDAB in the last 72 hours.  Anemia Panel: No results for input(s): VITAMINB12, FOLATE, FERRITIN, TIBC, IRON, RETICCTPCT in the last 72 hours.  Urine analysis:    Component Value Date/Time   COLORURINE YELLOW 04/18/2015 0114   APPEARANCEUR CLOUDY (A) 04/18/2015 0114   LABSPEC 1.017 04/18/2015 0114   PHURINE 5.0 04/18/2015 0114   GLUCOSEU NEGATIVE 04/18/2015 0114   HGBUR SMALL (A) 04/18/2015 0114   BILIRUBINUR NEGATIVE 04/18/2015 0114   KETONESUR NEGATIVE 04/18/2015 0114   PROTEINUR NEGATIVE 04/18/2015 0114   UROBILINOGEN 1.0 07/12/2012 1234   NITRITE NEGATIVE 04/18/2015 0114   LEUKOCYTESUR NEGATIVE 04/18/2015 0114    Sepsis Labs: _0 (procalcitonin:4,lacticidven:4) )No results found for this or any previous visit (from the past 240 hour(s)).   Radiological Exams on Admission: Dg Chest Port 1 View  Result Date: 11/29/2016 CLINICAL DATA:  Left hip fracture.  Preop evaluation. EXAM: PORTABLE CHEST 1 VIEW COMPARISON:  07/30/2016.  FINDINGS: Interval borderline enlarged cardiac silhouette, magnified by the portable AP technique. Clear lungs with normal vascularity. Unremarkable bones. IMPRESSION: Borderline cardiomegaly.  No acute abnormality. Electronically Signed   By: Claudie Revering M.D.   On: 11/29/2016 09:23   Dg Hip Unilat With Pelvis 2-3 Views Left  Result Date: 11/29/2016 CLINICAL DATA:  Left hip pain radiating into the left leg after falling this morning and landing on the hip. EXAM: DG HIP (WITH OR WITHOUT PELVIS) 2-3V LEFT COMPARISON:  None. FINDINGS: Comminuted left intertrochanteric fracture with mild anterior displacement of the distal fragment. No significant angulation. IMPRESSION: Comminuted left intertrochanteric fracture. Electronically Signed   By: Claudie Revering M.D.   On: 11/29/2016 08:51    EKG: Independently reviewed.  Assessment/Plan Active Problems:   Dyslipidemia   Thyroid disease   Essential hypertension   Rheumatoid arthritis ([place])   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 (HCC)   ILD (interstitial lung disease) (HCC)   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)   Chronic fatigue   Morbid obesity (HCC)   Chronic respiratory failure with hypoxia (HCC)   Physical deconditioning   Gout   Depression   Closed left hip fracture (HCC)   GERD (gastroesophageal reflux disease)   Fracture Left hip, closed intertrochanteric, confirmed by  XR, after mechanical fall  Received  IV pain meds CXR NAD.  WBC 11.1 Inpatient tele  NPO for now  SCDs IVF at 125 cc/h  Pain control as per Ortho  Hold ASA PT/OT consult Care management consult for possible IP rehab  Blood culture Repeat CBC and CMET in am  Type II Diabetes with neuropathyCurrent blood sugar level is 114 Lab Results  Component Value Date   HGBA1C 7.0 (H) 10/30/2014  Hgb A1C Hold home oral diabetic medications.   SSI Continue Neurontin  Hypertension BP 102/70   Pulse 94   Controlled Continue home  anti-hypertensive medications     Hyperlipidemia Continue home statins   Hypothyroidism: Continue home Synthroid   Acute on Chronic kidney disease stage 3 baseline creatinine 1.1-1.2    Current Cr 1.36 Lab Results  Component Value Date   CREATININE 1.36 (H) 11/29/2016   CREATININE 1.57 (H) 09/17/2016   CREATININE 1.25 (H) 04/14/2016   IVF Hold diuretics  Repeat CMET in am  . Interstitial lung disease, on chronic antibiotics with Zithromax and O2 when ambulating. CXR NAD  Continue O2 prn Continue nebs Mucinex prn Continue Prednisone after surgery   Continue Zithromax   Hypokalemia, in the setting of diuretics, currently at 3.1 Replenish with 10 meq IV and 30 meq oral  Rechek in am   Depression Continue home Cymbalta   Gout, no acute flare Continue Allopurinol    Physical deconditioning, chronic, now complicated with fracture and in the setting of RA, gout,  OT/PT consult once able to ambulate         DVT prophylaxis:   SCD's    Code Status:   Full     Family Communication:  Discussed with patient Disposition Plan: Expect patient to be discharged to home after condition improves Consults called:  Ortho per EDP   Admission status:Tele Inpatient   [facility] E, PA-C [facility]   11/29/2016, 10:44 AM

## 2016-11-29 NOTE — Anesthesia Preprocedure Evaluation (Addendum)
Anesthesia Evaluation  Patient identified by MRN, date of birth, ID band Patient awake    Reviewed: Allergy & Precautions, NPO status , Patient's Chart, lab work & pertinent test results  Airway Mallampati: II  TM Distance: >3 FB Neck ROM: Full    Dental  (+) Partial Upper, Partial Lower, Dental Advisory Given   Pulmonary COPD,  H/O Pulmonary Fibrosis   Pulmonary exam normal        Cardiovascular hypertension, Pt. on medications Normal cardiovascular exam     Neuro/Psych Depression    GI/Hepatic   Endo/Other  diabetes, Type 2, Oral Hypoglycemic AgentsMorbid obesity  Renal/GU Renal InsufficiencyRenal disease     Musculoskeletal   Abdominal   Peds  Hematology   Anesthesia Other Findings   Reproductive/Obstetrics                          Anesthesia Physical Anesthesia Plan  ASA: III  Anesthesia Plan: Spinal   Post-op Pain Management:    Induction: Intravenous  PONV Risk Score and Plan: 3 and Ondansetron, Dexamethasone, Propofol, Midazolam and Treatment may vary due to age or medical condition  Airway Management Planned: Simple Face Mask  Additional Equipment:   Intra-op Plan:   Post-operative Plan:   Informed Consent: I have reviewed the patients History and Physical, chart, labs and discussed the procedure including the risks, benefits and alternatives for the proposed anesthesia with the patient or authorized representative who has indicated his/her understanding and acceptance.     Plan Discussed with: CRNA and Surgeon  Anesthesia Plan Comments:        Anesthesia Quick Evaluation

## 2016-11-29 NOTE — Progress Notes (Signed)
Pt rec'd to room 12 from PACU in no distress. Awake, alert and orieneted. Aquacel dressing to left hip, no active bleeding. SCD's applied bilaterally, instructions for IS to pt.

## 2016-11-29 NOTE — Consult Note (Signed)
Reason for Consult:Left hip fracture Referring Physician: Tamera Punt  [name] is an 76 y.o. female.  HPI:  Patient reports she was getting up from sleeping this morning around 6 AM and lost her balance falling onto her left hip. She feels she may have gotten dizzy. She states she's had dizziness in the past about a year ago and this was related to her blood pressure medicines. She denies any loss of consciousness. She is complaining of left ankle pain. She's had worsening left knee pain over the last couple of weeks states she has known arthritis of the knee but is been told that she is not a candidate for surgery due to her comorbidities.  She is not had anything to eat since last night. Reports a couple of sips of juice this morning after the fall.  She ambulates at home with a walker. She has a history of COPD, diabetes, hypertension, hyperlipidemia, spinal stenosis and rheumatoid arthritis. She is on chronic prednisone for her rheumatoid arthritis and COPD  Past Medical History:  Diagnosis Date  . Arthritis    endstage changes bilateral knees/bilateral ankles.   . Asthma   . Carotid artery occlusion   . Chronic kidney disease   . Clotting disorder ([place])    pt denies this  . Contusion of left knee    due to fall 1/14.  [name] COPD (chronic obstructive pulmonary disease) (HCC)    pulmonary fibrosis  . Depression, reactive   . Diabetes mellitus   . Diastolic dysfunction   . Family history of heart disease   . High cholesterol   . Hypertension   . Interstitial lung disease ([place])   . 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    . Morbid obesity ([place])   . Normal coronary arteries    cardiac catheterization performed  10/31/14  . RA (rheumatoid arthritis) ([place])    has been off methotreaxte since 10/13.  [name] Shortness of breath   . Thyroid disease   . URI (upper respiratory infection)     Past  Surgical History:  Procedure Laterality Date  . ABDOMINAL HYSTERECTOMY    . BRAIN SURGERY     Gamma knife 10/13. Needs repeat spring  '14  . CARDIAC CATHETERIZATION N/A 10/31/2014   Procedure: Right/Left Heart Cath and Coronary Angiography;  Surgeon: [name], MD;  Location: [place] [facility];  Service: Cardiovascular;  Laterality: N/A;  . ESOPHAGOGASTRODUODENOSCOPY (EGD) WITH PROPOFOL N/A 09/14/2014   Procedure: ESOPHAGOGASTRODUODENOSCOPY (EGD) WITH PROPOFOL;  Surgeon: [name], MD;  [facility];  Service: Endoscopy;  Laterality: N/A;  . OVARY SURGERY    . SHOULDER SURGERY Left   . TONSILLECTOMY  age 52  . VIDEO BRONCHOSCOPY Bilateral 05/31/2013   Procedure: VIDEO BRONCHOSCOPY WITHOUT FLUORO;  Surgeon: Brand Males, MD;  Location: [place];  Service: Cardiopulmonary;  Laterality: Bilateral;  . video bronscoscopy  2000   lung    Family History  Problem Relation Age of Onset  . Diabetes Mother   . Heart attack Mother   . Hypertension Father   . Lung cancer Father   . Diabetes Sister   . Diabetes Brother   . Hypertension Brother   . Heart disease Brother   . Heart attack Brother   . Kidney cancer Brother   . Uterine cancer Daughter   . Breast cancer Sister   . Rheum arthritis Maternal Uncle   .  Gout Brother   . Kidney failure Brother        x 5    Social History:  reports that she has never smoked. She has never used smokeless tobacco. She reports that she does not drink alcohol or use drugs.  Allergies:  Allergies  Allergen Reactions  . Codeine Swelling    Facial swelling  . Remicade [Infliximab] Anaphylaxis    "sent me into shock"  . Zestril [Lisinopril] Swelling    Face and neck swelling    Medications: I have reviewed the patient's current medications.  No results found for this or any previous visit (from the past 48 hour(s)).  Dg Chest Port 1 View  Result Date: 11/29/2016 CLINICAL DATA:  Left hip fracture.  Preop  evaluation. EXAM: PORTABLE CHEST 1 VIEW COMPARISON:  07/30/2016. FINDINGS: Interval borderline enlarged cardiac silhouette, magnified by the portable AP technique. Clear lungs with normal vascularity. Unremarkable bones. IMPRESSION: Borderline cardiomegaly.  No acute abnormality. Electronically Signed   By: Claudie Revering M.D.   On: 11/29/2016 09:23   Dg Hip Unilat With Pelvis 2-3 Views Left  Result Date: 11/29/2016 CLINICAL DATA:  Left hip pain radiating into the left leg after falling this morning and landing on the hip. EXAM: DG HIP (WITH OR WITHOUT PELVIS) 2-3V LEFT COMPARISON:  None. FINDINGS: Comminuted left intertrochanteric fracture with mild anterior displacement of the distal fragment. No significant angulation. IMPRESSION: Comminuted left intertrochanteric fracture. Electronically Signed   By: Claudie Revering M.D.   On: 11/29/2016 08:51    Review of Systems  Constitutional: Negative for chills and fever.  Respiratory: Positive for shortness of breath.   Cardiovascular: Negative for chest pain.  Musculoskeletal: Positive for falls and joint pain.  Neurological: Positive for dizziness. Negative for loss of consciousness.   Blood pressure 137/76, pulse 88, temperature 97.7 F (36.5 C), temperature source Oral, resp. rate 15, height 5' 4" (1.626 m), weight 274 lb (124.3 kg), SpO2 95 %. Physical Exam  Constitutional: She is oriented to person, place, and time. She appears well-developed and well-nourished. No distress.  HENT:  Head: Normocephalic and atraumatic.  Cardiovascular: Intact distal pulses.   Respiratory: Effort normal.  Musculoskeletal:  Right hip good range of motion without pain. She is nontender about the right knee. She has tenderness along the medial joint line of the left knee. Left leg is shortened and externally rotated. Good dorsiflexion plantar flexion both ankles. Tenderness over the lateral malleolus left ankle. Mid lateral to distal tibia well-healed laceration with  some ecchymosis.[name] Upper extremities no gross deformities. No tenderness with palpation throughout bilateral shoulder girdles, humerus, elbow, forearm, wrist and hands. Gentle range of motion of the upper extremities causes no discomfort.  Neurological: She is alert and oriented to person, place, and time.  Skin: Skin is warm and dry. She is not diaphoretic.  Psychiatric: She has a normal mood and affect. Her behavior is normal.    Assessment/Plan: Comminuted left hip intertrochanteric fracture status post acute fall. Recommend open reduction internal fixation of the hip fracture. Risks and benefits of surgery discussed with patient and her family bedside by Dr. Ninfa Linden and myself. Left ankle radiographs portable to evaluate for fracture. Medicine to see patient for multiple comorbidities prior to surgery for any preoperative optimization. Strict bed rest and nothing by mouth  Kaelynn Igo 11/29/2016, 9:49 AM

## 2016-11-29 NOTE — Anesthesia Procedure Notes (Signed)
Procedure Name: MAC Date/Time: 11/29/2016 12:13 PM Performed by: Barrington Ellison Pre-anesthesia Checklist: Patient identified, Emergency Drugs available, Suction available and Patient being monitored Patient Re-evaluated:Patient Re-evaluated prior to induction Oxygen Delivery Method: Simple face mask

## 2016-11-29 NOTE — Transfer of Care (Signed)
Immediate Anesthesia Transfer of Care Note  Patient: [name]  Procedure(s) Performed: Procedure(s): INTRAMEDULLARY (IM) NAIL INTERTROCHANTRIC (Left)  Patient Location: PACU  Anesthesia Type:Spinal  Level of Consciousness: awake, alert  and oriented  Airway & Oxygen Therapy: Patient Spontanous Breathing and Patient connected to nasal cannula oxygen  Post-op Assessment: Report given to RN  Post vital signs: Reviewed and unstable  Last Vitals:  Vitals:   11/29/16 1100 11/29/16 1115  BP: 100/66 100/63  Pulse: 93 93  Resp: 19 16  Temp:      Last Pain:  Vitals:   11/29/16 0930  TempSrc:   PainSc: 6          Complications: low NIBP being treated

## 2016-11-30 DIAGNOSIS — J849 Interstitial pulmonary disease, unspecified: Secondary | ICD-10-CM

## 2016-11-30 DIAGNOSIS — E079 Disorder of thyroid, unspecified: Secondary | ICD-10-CM

## 2016-11-30 DIAGNOSIS — I10 Essential (primary) hypertension: Secondary | ICD-10-CM

## 2016-11-30 DIAGNOSIS — S72142A Displaced intertrochanteric fracture of left femur, initial encounter for closed fracture: Principal | ICD-10-CM

## 2016-11-30 DIAGNOSIS — M05741 Rheumatoid arthritis with rheumatoid factor of right hand without organ or systems involvement: Secondary | ICD-10-CM

## 2016-11-30 DIAGNOSIS — M05742 Rheumatoid arthritis with rheumatoid factor of left hand without organ or systems involvement: Secondary | ICD-10-CM

## 2016-11-30 LAB — BASIC METABOLIC PANEL
ANION GAP: 7 (ref 5–15)
BUN: 24 mg/dL — ABNORMAL HIGH (ref 6–20)
CALCIUM: 7.8 mg/dL — AB (ref 8.9–10.3)
CO2: 30 mmol/L (ref 22–32)
CREATININE: 1.43 mg/dL — AB (ref 0.44–1.00)
Chloride: 98 mmol/L — ABNORMAL LOW (ref 101–111)
GFR, EST AFRICAN AMERICAN: 40 mL/min — AB (ref >60)
GFR, EST NON AFRICAN AMERICAN: 35 mL/min — AB (ref >60)
Glucose, Bld: 190 mg/dL — ABNORMAL HIGH (ref 65–99)
Potassium: 4.6 mmol/L (ref 3.5–5.1)
SODIUM: 135 mmol/L (ref 135–145)

## 2016-11-30 LAB — GLUCOSE, CAPILLARY
GLUCOSE-CAPILLARY: 190 mg/dL — AB (ref 65–99)
GLUCOSE-CAPILLARY: 162 mg/dL — AB (ref 65–99)
GLUCOSE-CAPILLARY: 275 mg/dL — AB (ref 65–99)
Glucose-Capillary: 175 mg/dL — ABNORMAL HIGH (ref 65–99)

## 2016-11-30 LAB — CBC
HEMATOCRIT: 30.7 % — AB (ref 36.0–46.0)
Hemoglobin: 9.1 g/dL — ABNORMAL LOW (ref 12.0–15.0)
MCH: 28.5 pg (ref 26.0–34.0)
MCHC: 29.6 g/dL — ABNORMAL LOW (ref 30.0–36.0)
MCV: 96.2 fL (ref 78.0–100.0)
PLATELETS: 141 10*3/uL — AB (ref 150–400)
RBC: 3.19 MIL/uL — ABNORMAL LOW (ref 3.87–5.11)
RDW: 15.8 % — AB (ref 11.5–15.5)
WBC: 8.2 10*3/uL (ref 4.0–10.5)

## 2016-11-30 MED ORDER — ASPIRIN 325 MG PO TABS: 325.0000 mg | ORAL_TABLET | Freq: Two times a day (BID) | ORAL | 0 refills | Status: DC

## 2016-11-30 MED ORDER — OLMESARTAN MEDOXOMIL 20 MG PO TABS
20.0000 mg | ORAL_TABLET | Freq: Every day | ORAL | Status: DC
  Filled 2016-11-30 (×2): qty 1

## 2016-11-30 MED ORDER — MORPHINE SULFATE 15 MG PO TABS: 15.0000 mg | ORAL_TABLET | Freq: Three times a day (TID) | ORAL | 0 refills | Status: DC | PRN

## 2016-11-30 MED ORDER — MORPHINE SULFATE ER 15 MG PO TBCR: 15.0000 mg | EXTENDED_RELEASE_TABLET | Freq: Two times a day (BID) | ORAL | 0 refills | Status: DC

## 2016-11-30 MED ORDER — ASPIRIN 325 MG PO TABS
325.0000 mg | ORAL_TABLET | Freq: Two times a day (BID) | ORAL | Status: DC
  Administered 2016-11-30 – 2016-12-02 (×5): 325 mg via ORAL
  Filled 2016-11-30 (×6): qty 1

## 2016-11-30 NOTE — Progress Notes (Addendum)
[facility]  PROGRESS NOTE  [name] ZOX:[id number] DOB: 06/22/1940 DOA: 11/29/2016 PCP: Glendale Chard, MD   Brief HPI:    76 y.o. female with a Past Medical History negative for thyroid disease, rheumatoid arthritis, coronary disease, hypertension, does not associate, diabetes, COPD, chronic kidney disease who presents with fall. Patient sustained a closed intertrochanteric fracture left hip.   Subjective   Patient seen and examined, complains of distal exertion. Has history of interstitial lung disease.   Assessment/Plan:     1. Left hip fracture, status post intramedullary nail- orthopedics following, pain well controlled. 2. Diabetes mellitus with neuropathy- continue sliding scale insulin with NovoLog.CBG 190, 162. 3. Hypertension-blood pressure is controlled, continue metoprolol,HCTZ. 4. Interstitial lung disease- lungs clear to auscultation. Continue prednisone 10 mg by mouth daily.oxygen via nasal cannula, Mucinex when necessary, when necessary albuterol nebulizer.continue Azithromycin 500 mg Monday -Wednesday- Friday 5. Hypothyroidism-continue Synthroid 6. Hyperlipidemia-continue statin 7. Depression-continue Cymbalta 8. Gout-continue allopurinol 9. Rheumatoid arthritis- stable,continue hydroxychloroquine, prednisone.    DVT prophylaxis: as per orthopedics  Code Status: full code  Family Communication: no family at bedside   Disposition Plan: skilled nursing facility   Consultants:  orthopedics  Procedures:  Intramedullary nail   Continuous infusions . sodium chloride 125 mL/hr at 11/29/16 0931  . sodium chloride    . methocarbamol (ROBAXIN)  IV        Antibiotics:   Anti-infectives    Start     Dose/Rate Route Frequency Ordered Stop   12/01/16 1000  azithromycin (ZITHROMAX) tablet 500 mg     500 mg Oral Every M-W-F 11/29/16 1050     11/30/16 1000  hydroxychloroquine (PLAQUENIL) tablet 200 mg     200 mg Oral Daily 11/29/16 1050      11/29/16 2130  hydroxychloroquine (PLAQUENIL) tablet 200 mg     200 mg Oral  Once 11/29/16 2026 11/29/16 2055   11/29/16 1800  ceFAZolin (ANCEF) IVPB 2g/100 mL premix     2 g 200 mL/hr over 30 Minutes Intravenous Every 6 hours 11/29/16 1757 11/30/16 0504   11/29/16 1200  ceFAZolin (ANCEF) 3 g in dextrose 5 % 50 mL IVPB  Status:  Discontinued    Comments:  Please send to OR tube station ASAP   3 g 130 mL/hr over 30 Minutes Intravenous  Once 11/29/16 1148 11/29/16 1201       Objective   Vitals:   11/29/16 1715 11/29/16 2028 11/30/16 0018 11/30/16 0418  BP: 98/62 (!) 107/59 99/60 (!) 92/52  Pulse: 89 (!) 111 (!) 109 (!) 107  Resp: _0 Temp: (!) 97 F (36.1 C) 99.5 F (37.5 C) 98.8 F (37.1 C) 98.9 F (37.2 C)  TempSrc:  Oral Oral Oral  SpO2: 96% 91% 94% 97%  Weight:      Height:        Intake/Output Summary (Last 24 hours) at 11/30/16 1353 Last data filed at 11/30/16 1011  Gross per 24 hour  Intake          2549.17 ml  Output             1100 ml  Net          1449.17 ml   Filed Weights   11/29/16 0801  Weight: 124.3 kg (274 lb)     Physical Examination:  Physical Exam: Eyes: No icterus, extraocular muscles intact  Mouth: Oral mucosa is moist, no lesions on palate,  Neck: Supple, no deformities, masses, or tenderness Lungs:  Normal respiratory effort, bilateral clear to auscultation, no crackles or wheezes.  Heart: Regular rate and rhythm, S1 and S2 normal, no murmurs, rubs auscultated Abdomen: BS normoactive,soft,nondistended,non-tender to palpation,no organomegaly Extremities: No pretibial edema, no erythema, no cyanosis, no clubbing Neuro : Alert and oriented to time, place and person, No focal deficits Skin: No rashes seen on exam    Data Reviewed: I have personally reviewed following labs and imaging studies  CBG:  Recent Labs Lab 11/29/16 1427 11/29/16 1853 11/29/16 2122 11/30/16 0613 11/30/16 1202  GLUCAP 129* 226* 293* 190* 162*     CBC:  Recent Labs Lab 11/29/16 0928 11/30/16 0303  WBC 11.1* 8.2  NEUTROABS 6.1  --   HGB 11.6* 9.1*  HCT 37.7 30.7*  MCV 96.4 96.2  PLT 150 141*    Basic Metabolic Panel:  Recent Labs Lab 11/29/16 0928 11/30/16 0303  NA 139 135  K 3.1* 4.6  CL 96* 98*  CO2 32 30  GLUCOSE 113* 190*  BUN 21* 24*  CREATININE 1.36* 1.43*  CALCIUM 8.5* 7.8*    No results found for this or any previous visit (from the past 240 hour(s)).   Liver Function Tests:  Recent Labs Lab 11/29/16 1433  AST 22  ALT 29  ALKPHOS 59  BILITOT 0.6  PROT 5.0*  ALBUMIN 2.8*   No results for input(s): LIPASE, AMYLASE in the last 168 hours. No results for input(s): AMMONIA in the last 168 hours.  Cardiac Enzymes: No results for input(s): CKTOTAL, CKMB, CKMBINDEX, TROPONINI in the last 168 hours. BNP (last 3 results) No results for input(s): BNP in the last 8760 hours.  ProBNP (last 3 results) No results for input(s): PROBNP in the last 8760 hours.    Studies: Dg Chest Port 1 View  Result Date: 11/29/2016 CLINICAL DATA:  Left hip fracture.  Preop evaluation. EXAM: PORTABLE CHEST 1 VIEW COMPARISON:  07/30/2016. FINDINGS: Interval borderline enlarged cardiac silhouette, magnified by the portable AP technique. Clear lungs with normal vascularity. Unremarkable bones. IMPRESSION: Borderline cardiomegaly.  No acute abnormality. Electronically Signed   By: Claudie Revering M.D.   On: 11/29/2016 09:23   Dg Ankle Left Port  Result Date: 11/29/2016 CLINICAL DATA:  Anterior and lateral ankle pain.  Fall. EXAM: PORTABLE LEFT ANKLE - 2 VIEW COMPARISON:  None. FINDINGS: Posterior and plantar calcaneal spurs. Diffuse soft tissue swelling. No fracture, subluxation or dislocation. IMPRESSION: No acute bony abnormality. Electronically Signed   By: Rolm Baptise M.D.   On: 11/29/2016 10:54   Dg C-arm 1-60 Min  Result Date: 11/29/2016 CLINICAL DATA:  Nailing of LEFT femoral intertrochanteric fracture EXAM: DG  C-ARM 61-120 MIN; LEFT FEMUR 2 VIEWS COMPARISON:  11/29/2016 FLUOROSCOPY TIME:  2 minutes 57 seconds Images obtained: 6 FINDINGS: IM nail with proximal crest compression screw placed across an intertrochanteric fracture of the proximal LEFT femur. Significantly improved alignment at the fracture. Diffuse osseous demineralization. No dislocation. Distal locking screw present. IMPRESSION: Post ORIF LEFT femur. Electronically Signed   By: Lavonia Dana M.D.   On: 11/29/2016 14:54   Dg Hip Unilat With Pelvis 2-3 Views Left  Result Date: 11/29/2016 CLINICAL DATA:  Left hip pain radiating into the left leg after falling this morning and landing on the hip. EXAM: DG HIP (WITH OR WITHOUT PELVIS) 2-3V LEFT COMPARISON:  None. FINDINGS: Comminuted left intertrochanteric fracture with mild anterior displacement of the distal fragment. No significant angulation. IMPRESSION: Comminuted left intertrochanteric fracture. Electronically Signed   By: Percell Locus.D.  On: 11/29/2016 08:51   Dg Femur Min 2 Views Left  Result Date: 11/29/2016 CLINICAL DATA:  Nailing of LEFT femoral intertrochanteric fracture EXAM: DG C-ARM 61-120 MIN; LEFT FEMUR 2 VIEWS COMPARISON:  11/29/2016 FLUOROSCOPY TIME:  2 minutes 57 seconds Images obtained: 6 FINDINGS: IM nail with proximal crest compression screw placed across an intertrochanteric fracture of the proximal LEFT femur. Significantly improved alignment at the fracture. Diffuse osseous demineralization. No dislocation. Distal locking screw present. IMPRESSION: Post ORIF LEFT femur. Electronically Signed   By: Lavonia Dana M.D.   On: 11/29/2016 14:54    Scheduled Meds: . allopurinol  300 mg Oral Daily  . aspirin  325 mg Oral BID PC  . atorvastatin  10 mg Oral QPM  . [START ON 12/01/2016] azithromycin  500 mg Oral Q M,W,F  . docusate sodium  100 mg Oral BID  . DULoxetine  60 mg Oral Daily  . fluticasone  2 spray Each Nare Daily  . furosemide  20 mg Oral Daily  . gabapentin  1,200  mg Oral TID  . guaiFENesin  1,200 mg Oral BID  . hydrochlorothiazide  25 mg Oral Daily  . hydroxychloroquine  200 mg Oral Daily  . insulin aspart  0-9 Units Subcutaneous TID WC  . levothyroxine  50 mcg Oral Daily  . metoprolol succinate  100 mg Oral q morning - 10a  . olmesartan  40 mg Oral Daily  . pantoprazole  40 mg Oral Daily  . potassium chloride  30 mEq Oral Once  . predniSONE  10 mg Oral q morning - 10a      Time spent: 25 min  [facility] Pager [phone]. If 7PM-7AM, please contact night-coverage at [email or web address], Office  [phone]  password [email or web address]  11/30/2016, 1:53 PM  LOS: 1 day

## 2016-11-30 NOTE — Progress Notes (Signed)
Called and discussed with patient, her systolic blood pressure is now dropped to 90s. Patient is on multiple antidepressant medications. She is on both Lasix as well as HCTZ 25 mg daily. Will discontinue HCTZ and cut down the dose of irbesartan 20 mg by mouth daily. Continue Toprol-XL 100 mg daily.

## 2016-11-30 NOTE — Progress Notes (Signed)
Subjective: 1 Day Post-Op Procedure(s) (LRB): INTRAMEDULLARY (IM) NAIL INTERTROCHANTRIC (Left) Patient reports pain as moderate.  Acute blood loss anemia from a combination of her surgery and fracture.  Objective: Vital signs in last 24 hours: Temp:  [92.3 F (33.5 C)-99.5 F (37.5 C)] 98.9 F (37.2 C) (07/15 0418) Pulse Rate:  [79-111] 107 (07/15 0418) Resp:  [11-19] 18 (07/15 0418) BP: (70-137)/(49-83) 92/52 (07/15 0418) SpO2:  [91 %-100 %] 97 % (07/15 0418) Weight:  [274 lb (124.3 kg)] 274 lb (124.3 kg) (07/14 0801)  Intake/Output from previous day: 07/14 0701 - 07/15 0700 In: 3309.2 [P.O.:180; I.V.:3029.2; IV Piggyback:100] Out: 1600 [Urine:1100; Blood:500] Intake/Output this shift: No intake/output data recorded.   Recent Labs  11/29/16 0928 11/30/16 0303  HGB 11.6* 9.1*    Recent Labs  11/29/16 0928 11/30/16 0303  WBC 11.1* 8.2  RBC 3.91 3.19*  HCT 37.7 30.7*  PLT 150 141*    Recent Labs  11/29/16 0928 11/30/16 0303  NA 139 135  K 3.1* 4.6  CL 96* 98*  CO2 32 30  BUN 21* 24*  CREATININE 1.36* 1.43*  GLUCOSE 113* 190*  CALCIUM 8.5* 7.8*    Recent Labs  11/29/16 0928  INR 1.05    Intact pulses distally Dorsiflexion/Plantar flexion intact Incision: scant drainage  Assessment/Plan: 1 Day Post-Op Procedure(s) (LRB): INTRAMEDULLARY (IM) NAIL INTERTROCHANTRIC (Left) Up with therapy - only 25 to 50% weight on her left hip for now. Will most likely need skilled nursing post her hospital stay and I have spoke to her about this.  [name] 11/30/2016, 7:46 AM

## 2016-11-30 NOTE — Discharge Instructions (Signed)
Only 25 to 50% weight on her left hip until further notice. New dry dressings as needed left hip incisions.

## 2016-11-30 NOTE — Evaluation (Signed)
Physical Therapy Evaluation Patient Details Name: [name] MRN: [id number] DOB: 1941/02/06 Today's Date: 11/30/2016   History of Present Illness  Pt is a 76 y.o. female with complex medical history. She fell at home sustaining L hip fx and underwent IM nailing 11-29-16.  Clinical Impression  Pt admitted with above diagnosis. Pt currently with functional limitations due to the deficits listed below (see PT Problem List). On eval, pt required +2 mod assist bed mobility and +2 max assist transfers. Bariatric stedy utilized for bed to recliner transfer. Pt unable to maintain WB precautions in standing. Recommend nursing staff utilize [facility] for return to bed.  Pt will benefit from skilled PT to increase their independence and safety with mobility to allow discharge to the venue listed below.       Follow Up Recommendations SNF;Supervision/Assistance - 24 hour    Equipment Recommendations  None recommended by PT    Recommendations for Other Services       Precautions / Restrictions Precautions Precautions: Fall Restrictions Weight Bearing Restrictions: Yes LLE Weight Bearing: Partial weight bearing LLE Partial Weight Bearing Percentage or Pounds: 25-50%      Mobility  Bed Mobility Overal bed mobility: Needs Assistance Bed Mobility: Supine to Sit     Supine to sit: +2 for physical assistance;Mod assist     General bed mobility comments: pt requires (A) to support trunk and progress with UE support  Transfers Overall transfer level: Needs assistance   Transfers: Sit to/from Stand Sit to Stand: +2 physical assistance;Max assist;From elevated surface         General transfer comment: Pt with bed elevated and stedy used to complete transfer. pt unable to maintain weight bearing precautions  Ambulation/Gait             General Gait Details: unable  Stairs            Wheelchair Mobility    Modified Rankin (Stroke Patients Only)       Balance                                              Pertinent Vitals/Pain Pain Assessment: Faces Faces Pain Scale: Hurts whole lot Pain Location: L hip during mobility Pain Descriptors / Indicators: Grimacing;Guarding;Operative site guarding Pain Intervention(s): Monitored during session;Limited activity within patient's tolerance;Repositioned;RN gave pain meds during session;Ice applied    Home Living Family/patient expects to be discharged to:: Private residence Living Arrangements: Spouse/significant other Available Help at Discharge: Family;Available PRN/intermittently Type of Home: House Home Access: Stairs to enter;Ramped entrance Entrance Stairs-Rails: None Entrance Stairs-Number of Steps: 3 Home Layout: Two level Home Equipment: Walker - 2 wheels;Walker - 4 wheels;Cane - single point;Wheelchair - [employer];Shower seat Additional Comments: wears 2 L O2 at home     Prior Function Level of Independence: Needs assistance   Gait / Transfers Assistance Needed: uses RW but reports barely making it to the kitchen to get something to eat     Comments: Hx of rehab at [facility]: Right    Extremity/Trunk Assessment   Upper Extremity Assessment Upper Extremity Assessment: Generalized weakness RUE Deficits / Details: decr grasp LUE Deficits / Details: decr grasp    Lower Extremity Assessment Lower Extremity Assessment: LLE deficits/detail LLE Deficits / Details: spinal stenosis with L LE weakness  Cervical / Trunk Assessment Cervical / Trunk Assessment: Other exceptions (spinal stenosis)  Communication   Communication: No difficulties  Cognition Arousal/Alertness: Awake/alert Behavior During Therapy: WFL for tasks assessed/performed Overall Cognitive Status: Within Functional Limits for tasks assessed                                        General Comments      Exercises      Assessment/Plan    PT Assessment Patient needs continued PT services  PT Problem List Decreased strength;Decreased mobility;Decreased knowledge of precautions;Decreased activity tolerance;Decreased balance;Pain;Cardiopulmonary status limiting activity       PT Treatment Interventions DME instruction;Gait training;Functional mobility training;Therapeutic activities;Therapeutic exercise;Balance training;Patient/family education    PT Goals (Current goals can be found in the Care Plan section)  Acute Rehab PT Goals Patient Stated Goal: to get more therapy PT Goal Formulation: With patient Time For Goal Achievement: 12/14/16 Potential to Achieve Goals: Fair    Frequency Min 3X/week   Barriers to discharge        Co-evaluation PT/OT/SLP Co-Evaluation/Treatment: Yes Reason for Co-Treatment: Complexity of the patient's impairments (multi-system involvement);For patient/therapist safety;To address functional/ADL transfers PT goals addressed during session: Mobility/safety with mobility;Balance OT goals addressed during session: ADL's and self-care;Proper use of Adaptive equipment and DME;Strengthening/ROM       AM-PAC PT "6 Clicks" Daily Activity  Outcome Measure Difficulty turning over in bed (including adjusting bedclothes, sheets and blankets)?: Total Difficulty moving from lying on back to sitting on the side of the bed? : Total Difficulty sitting down on and standing up from a chair with arms (e.g., wheelchair, bedside commode, etc,.)?: Total Help needed moving to and from a bed to chair (including a wheelchair)?: A Lot Help needed walking in hospital room?: Total Help needed climbing 3-5 steps with a railing? : Total 6 Click Score: 7    End of Session Equipment Utilized During Treatment: Gait belt;Oxygen Activity Tolerance: Patient limited by pain Patient left: in chair;with call bell/phone within reach;Other (comment) (with lift pad in recliner) Nurse Communication:  Mobility status;Need for lift equipment;Precautions PT Visit Diagnosis: Muscle weakness (generalized) (M62.81);Pain;Other abnormalities of gait and mobility (R26.89) Pain - Right/Left: Left Pain - part of body: Hip    Time: 0301-3143 PT Time Calculation (min) (ACUTE ONLY): 48 min   Charges:   PT Evaluation $PT Eval Moderate Complexity: 1 Procedure PT Treatments $Therapeutic Activity: 8-22 mins   PT G Codes:        Lorrin Goodell, PT  Office # [phone] Pager [phone]   Lorriane Shire 11/30/2016, 12:12 PM

## 2016-11-30 NOTE — Progress Notes (Signed)
Pt desires to discuss her BP issues with Hospitaist. Dr. Darrick Meigs paged for pt.

## 2016-11-30 NOTE — Evaluation (Addendum)
Occupational Therapy Evaluation Patient Details Name: [name] MRN: [id number] DOB: 04/15/1941 Today's Date: 11/30/2016    History of Present Illness 76 yo female s/p IM L with PWB 25-50%   Clinical Impression   Patient is s/p IM L hip surgery resulting in functional limitations due to the deficits listed below (see OT problem list). pt will requires SNF for d/c due to lack of support and inabilityt o complete transfers.  Patient will benefit from skilled OT acutely to increase independence and safety with ADLS to allow discharge snf.  BP supine   135/82 BP sitting       113/58 Bp in chair    123/69     Follow Up Recommendations  SNF;Supervision/Assistance - 24 hour    Equipment Recommendations  Hospital bed;Wheelchair cushion (measurements OT);Wheelchair (measurements OT) (electric w/c)    Recommendations for Other Services       Precautions / Restrictions Precautions Precautions: Fall      Mobility Bed Mobility Overal bed mobility: Needs Assistance Bed Mobility: Supine to Sit     Supine to sit: +2 for physical assistance;Mod assist     General bed mobility comments: pt requires (A) to support trunk and progress with UE support  Transfers Overall transfer level: Needs assistance   Transfers: Sit to/from Stand Sit to Stand: +2 physical assistance;Max assist;From elevated surface         General transfer comment: Pt with bed elevated and stedy used to complete transfer. pt unable to maintain weight bearing precautions    Balance                                           ADL either performed or assessed with clinical judgement   ADL                                               Vision Baseline Vision/History: Wears glasses Wears Glasses: At all times Additional Comments: hx of diplopia since 2013 due to brain surgery L side of brain     Perception     Praxis      Pertinent Vitals/Pain       Hand  Dominance Right   Extremity/Trunk Assessment Upper Extremity Assessment Upper Extremity Assessment: Generalized weakness RUE Deficits / Details: decr grasp LUE Deficits / Details: decr grasp   Lower Extremity Assessment Lower Extremity Assessment: LLE deficits/detail LLE Deficits / Details: spinal stenosis with L LE weakness   Cervical / Trunk Assessment Cervical / Trunk Assessment: Other exceptions (spinal stenosis)   Communication Communication Communication: No difficulties   Cognition Arousal/Alertness: Awake/alert Behavior During Therapy: WFL for tasks assessed/performed Overall Cognitive Status: Within Functional Limits for tasks assessed                                     General Comments       Exercises     Shoulder Instructions      Home Living Family/patient expects to be discharged to:: Private residence Living Arrangements: Spouse/significant other Available Help at Discharge: Family;Available PRN/intermittently Type of Home: House Home Access: Stairs to enter;Ramped entrance (in garage) Technical brewer of Steps: 3   Home Layout: Two  level     Bathroom Shower/Tub: Occupational psychologist: Handicapped height Bathroom Accessibility: Yes   Home Equipment: Environmental consultant - 2 wheels;Walker - 4 wheels;Cane - single point;Wheelchair - [employer];Shower seat (lift chair)          Prior Functioning/Environment Level of Independence: Needs assistance  Gait / Transfers Assistance Needed: uses RW but reports barely making it to the kitchen to get something to eat     Comments: H[facility] of rehab at [facility]        OT Problem List: Decreased strength;Decreased activity tolerance;Impaired balance (sitting and/or standing);Decreased safety awareness;Decreased knowledge of use of DME or AE;Decreased knowledge of precautions;Obesity;Pain      OT Treatment/Interventions: Self-care/ADL training;Therapeutic exercise;DME and/or  AE instruction;Therapeutic activities;Patient/family education;Balance training    OT Goals(Current goals can be found in the care plan section) Acute Rehab OT Goals Patient Stated Goal: to get more therapy OT Goal Formulation: With patient Time For Goal Achievement: 12/14/16 Potential to Achieve Goals: Good  OT Frequency: Min 2X/week   Barriers to D/C: Decreased caregiver support          Co-evaluation PT/OT/SLP Co-Evaluation/Treatment: Yes Reason for Co-Treatment: Complexity of the patient's impairments (multi-system involvement);Necessary to address cognition/behavior during functional activity;For patient/therapist safety;To address functional/ADL transfers   OT goals addressed during session: ADL's and self-care;Proper use of Adaptive equipment and DME;Strengthening/ROM      AM-PAC PT "6 Clicks" Daily Activity     Outcome Measure Help from another person eating meals?: None Help from another person taking care of personal grooming?: A Lot Help from another person toileting, which includes using toliet, bedpan, or urinal?: A Lot Help from another person bathing (including washing, rinsing, drying)?: A Lot Help from another person to put on and taking off regular upper body clothing?: A Lot Help from another person to put on and taking off regular lower body clothing?: A Lot 6 Click Score: 14   End of Session Equipment Utilized During Treatment: Gait belt;Oxygen Nurse Communication: Mobility status;Precautions;Weight bearing status  Activity Tolerance: Other (comment) (chest pain due to anxiety) Patient left: in chair;with call bell/phone within reach;with chair alarm set  OT Visit Diagnosis: Unsteadiness on feet (R26.81)                Time: 0909-1000 OT Time Calculation (min): 51 min Charges:  OT General Charges $OT Visit: 1 Procedure OT Evaluation $OT Eval High Complexity: 1 Procedure OT Treatments $Therapeutic Activity: 8-22 mins G-Codes:     [name] Modena    OTR/L Pager: [phone] Office: [phone] .   Parke Poisson B 11/30/2016, 10:32 AM

## 2016-12-01 ENCOUNTER — Encounter (HOSPITAL_COMMUNITY): Payer: Self-pay | Admitting: Orthopaedic Surgery

## 2016-12-01 LAB — BASIC METABOLIC PANEL
ANION GAP: 6 (ref 5–15)
BUN: 24 mg/dL — ABNORMAL HIGH (ref 6–20)
CHLORIDE: 95 mmol/L — AB (ref 101–111)
CO2: 30 mmol/L (ref 22–32)
CREATININE: 1.28 mg/dL — AB (ref 0.44–1.00)
Calcium: 7.9 mg/dL — ABNORMAL LOW (ref 8.9–10.3)
GFR calc non Af Amer: 40 mL/min — ABNORMAL LOW (ref >60)
GFR, EST AFRICAN AMERICAN: 46 mL/min — AB (ref >60)
Glucose, Bld: 149 mg/dL — ABNORMAL HIGH (ref 65–99)
POTASSIUM: 4.2 mmol/L (ref 3.5–5.1)
SODIUM: 131 mmol/L — AB (ref 135–145)

## 2016-12-01 LAB — CBC
HEMATOCRIT: 25.6 % — AB (ref 36.0–46.0)
HEMOGLOBIN: 8.3 g/dL — AB (ref 12.0–15.0)
MCH: 30.4 pg (ref 26.0–34.0)
MCHC: 32.4 g/dL (ref 30.0–36.0)
MCV: 93.8 fL (ref 78.0–100.0)
Platelets: 116 10*3/uL — ABNORMAL LOW (ref 150–400)
RBC: 2.73 MIL/uL — AB (ref 3.87–5.11)
RDW: 16.2 % — ABNORMAL HIGH (ref 11.5–15.5)
WBC: 9.6 10*3/uL (ref 4.0–10.5)

## 2016-12-01 LAB — GLUCOSE, CAPILLARY
GLUCOSE-CAPILLARY: 260 mg/dL — AB (ref 65–99)
GLUCOSE-CAPILLARY: 124 mg/dL — AB (ref 65–99)
GLUCOSE-CAPILLARY: 200 mg/dL — AB (ref 65–99)
GLUCOSE-CAPILLARY: 131 mg/dL — AB (ref 65–99)

## 2016-12-01 MED ORDER — METOPROLOL SUCCINATE ER 50 MG PO TB24
50.0000 mg | ORAL_TABLET | Freq: Every morning | ORAL | Status: DC
  Administered 2016-12-02: 50 mg via ORAL
  Filled 2016-12-01: qty 1

## 2016-12-01 MED ORDER — INSULIN ASPART 100 UNIT/ML ~~LOC~~ SOLN
0.0000 [IU] | Freq: Three times a day (TID) | SUBCUTANEOUS | Status: DC
  Administered 2016-12-01: 3 [IU] via SUBCUTANEOUS
  Administered 2016-12-01 – 2016-12-02 (×2): 4 [IU] via SUBCUTANEOUS

## 2016-12-01 MED ORDER — POLYETHYLENE GLYCOL 3350 17 G PO PACK
17.0000 g | PACK | Freq: Every day | ORAL | Status: DC
Start: 2016-12-01 — End: 2016-12-03
  Administered 2016-12-01 – 2016-12-02 (×2): 17 g via ORAL
  Filled 2016-12-01 (×2): qty 1

## 2016-12-01 MED ORDER — BISACODYL 10 MG RE SUPP: 10.0000 mg | Freq: Once | RECTAL | Status: DC

## 2016-12-01 NOTE — Progress Notes (Signed)
RN discontinued tele per MD order.

## 2016-12-01 NOTE — Progress Notes (Signed)
Subjective: 2 Days Post-Op Procedure(s) (LRB): INTRAMEDULLARY (IM) NAIL INTERTROCHANTRIC (Left) Patient reports pain as moderate.  Slow progress with therapy.  Has been hypotensive and lower H&H - from fracture and surgery.  Objective: Vital signs in last 24 hours: Temp:  [98.2 F (36.8 C)-98.3 F (36.8 C)] 98.2 F (36.8 C) (07/15 2100) Pulse Rate:  [93-96] 93 (07/15 2100) Resp:  [18] 18 (07/15 2100) BP: (96-123)/(56-65) 123/56 (07/15 2100) SpO2:  [97 %-98 %] 98 % (07/15 2100)  Intake/Output from previous day: 07/15 0701 - 07/16 0700 In: 240 [P.O.:240] Out: 850 [Urine:850] Intake/Output this shift: No intake/output data recorded.   Recent Labs  11/29/16 0928 11/30/16 0303 12/01/16 0323  HGB 11.6* 9.1* 8.3*    Recent Labs  11/30/16 0303 12/01/16 0323  WBC 8.2 9.6  RBC 3.19* 2.73*  HCT 30.7* 25.6*  PLT 141* 116*    Recent Labs  11/30/16 0303 12/01/16 0323  NA 135 131*  K 4.6 4.2  CL 98* 95*  CO2 30 30  BUN 24* 24*  CREATININE 1.43* 1.28*  GLUCOSE 190* 149*  CALCIUM 7.8* 7.9*    Recent Labs  11/29/16 0928  INR 1.05    Sensation intact distally Intact pulses distally Dorsiflexion/Plantar flexion intact Incision: moderate drainage  Assessment/Plan: 2 Days Post-Op Procedure(s) (LRB): INTRAMEDULLARY (IM) NAIL INTERTROCHANTRIC (Left) Up with therapy  Follow H&H and transfuse as necessary medically. Will need skilled nursing placement post-hospital stay.  [name] 12/01/2016, 7:40 AM

## 2016-12-01 NOTE — Progress Notes (Signed)
Physical Therapy Treatment Patient Details Name: [name] MRN: [id number] DOB: 18-Oct-1940 Today's Date: 12/01/2016    History of Present Illness Pt is a 76 y.o. female with complex medical history. She fell at home sustaining L hip fx and underwent IM nailing 11-29-16.    PT Comments    Pt continues to required +2 assist for bed mobilities and transfers. Pt c/o light headedness on rising to seated position. BP 104/49. Transferred to chair with bariatric steady and performed ther ex for LE strengthening and ROM. Patient would benefit from continued skilled PT. Will continue to follow acutely.    Follow Up Recommendations  SNF;Supervision/Assistance - 24 hour     Equipment Recommendations  None recommended by PT    Recommendations for Other Services       Precautions / Restrictions Precautions Precautions: Fall Restrictions Weight Bearing Restrictions: Yes LLE Weight Bearing: Partial weight bearing LLE Partial Weight Bearing Percentage or Pounds: 25-50%    Mobility  Bed Mobility Overal bed mobility: Needs Assistance Bed Mobility: Supine to Sit     Supine to sit: +2 for physical assistance;Mod assist     General bed mobility comments: pt requires (A) to support trunk and progress LE off EOB  Transfers Overall transfer level: Needs assistance   Transfers: Sit to/from Stand Sit to Stand: +2 physical assistance;Max assist;From elevated surface         General transfer comment: Pt with bed elevated and stedy used to complete transfer. pt unable to maintain weight bearing precautions  Ambulation/Gait             General Gait Details: unable   Stairs            Wheelchair Mobility    Modified Rankin (Stroke Patients Only)       Balance                                            Cognition Arousal/Alertness: Awake/alert Behavior During Therapy: WFL for tasks assessed/performed Overall Cognitive Status: Within  Functional Limits for tasks assessed                                        Exercises General Exercises - Lower Extremity Ankle Circles/Pumps: AROM;Both;10 reps;Supine Long Arc Quad: AROM;Left;10 reps;Seated Hip ABduction/ADduction: AROM;Left;10 reps;Seated    General Comments        Pertinent Vitals/Pain Pain Assessment: 0-10 Pain Score: 8  Pain Location: L hip during mobility Pain Descriptors / Indicators: Grimacing;Guarding;Operative site guarding Pain Intervention(s): Monitored during session;Limited activity within patient's tolerance;Repositioned    Home Living                      Prior Function            PT Goals (current goals can now be found in the care plan section) Acute Rehab PT Goals Patient Stated Goal: to get more therapy PT Goal Formulation: With patient Time For Goal Achievement: 12/14/16 Potential to Achieve Goals: Fair Progress towards PT goals: Progressing toward goals    Frequency    Min 3X/week      PT Plan Current plan remains appropriate    Co-evaluation              AM-PAC PT "6 Clicks"  Daily Activity  Outcome Measure  Difficulty turning over in bed (including adjusting bedclothes, sheets and blankets)?: Total Difficulty moving from lying on back to sitting on the side of the bed? : Total Difficulty sitting down on and standing up from a chair with arms (e.g., wheelchair, bedside commode, etc,.)?: Total Help needed moving to and from a bed to chair (including a wheelchair)?: A Lot Help needed walking in hospital room?: Total Help needed climbing 3-5 steps with a railing? : Total 6 Click Score: 7    End of Session Equipment Utilized During Treatment: Gait belt;Oxygen Activity Tolerance: Patient limited by pain Patient left: in chair;with call bell/phone within reach Nurse Communication: Mobility status;Need for lift equipment;Precautions PT Visit Diagnosis: Muscle weakness (generalized)  (M62.81);Pain;Other abnormalities of gait and mobility (R26.89) Pain - Right/Left: Left Pain - part of body: Hip     Time: 1417-1500 PT Time Calculation (min) (ACUTE ONLY): 43 min  Charges:  $Therapeutic Exercise: 8-22 mins $Therapeutic Activity: 23-37 mins                    G Codes:       Benjiman Core, [place] Pager [phone] Acute Rehab    Allena Katz 12/01/2016, 3:16 PM

## 2016-12-01 NOTE — Progress Notes (Signed)
MD gave RN verbal order to discontinue patient's telemetry. RN notified MD that the patient's level of care needs to be changed from telemetry to med-surg.

## 2016-12-01 NOTE — Progress Notes (Signed)
[facility]  PROGRESS NOTE  [name] VFI:[id number] DOB: [birth year] DOA: 11/29/2016 PCP: Glendale Chard, MD   Brief HPI:    76 y.o. female with a Past Medical History negative for thyroid disease, rheumatoid arthritis, coronary disease, hypertension, does not associate, diabetes, COPD, chronic kidney disease who presents with fall. Patient sustained a closed intertrochanteric fracture left hip.   Subjective   Patient seen and examined, denies shortness of breath today. Blood pressure is better after cutting down her meds.   Assessment/Plan:     1. Left hip fracture, status post intramedullary nail- orthopedics following, pain well controlled. 2. Diabetes mellitus with neuropathy- continue sliding scale insulin with NovoLog.CBG 260, 175. Will change sliding scale to resistant. 3. Hypotension- blood  pressure is better after  HCTZ has been discontinued. Olmesartan reduced to  down to 20 mg by mouth daily. Will change the dose of Toprol-XL to 50 mg by mouth daily, as blood pressure is 115/56. 4. Interstitial lung disease- lungs clear to auscultation. Continue prednisone 10 mg by mouth daily.Oxygen via nasal cannula, Mucinex when necessary, when necessary albuterol nebulizer.continue Azithromycin 500 mg Monday -Wednesday- Friday. 5. Anemia-likely acute blood loss during surgery, postop hemoglobin down to 8.3. Will transfuse for hemoglobin less than 7. Follow CBC in a.m. 6. Hypothyroidism-continue Synthroid 7. Hyperlipidemia-continue statin 8. Depression-continue Cymbalta 9. Gout-continue allopurinol 10. Rheumatoid arthritis- stable,continue hydroxychloroquine, prednisone.    DVT prophylaxis: as per orthopedics  Code Status: full code  Family Communication: no family at bedside   Disposition Plan: skilled nursing facility   Consultants:  orthopedics  Procedures:  Intramedullary nail   Continuous infusions . sodium chloride 75 mL/hr at 12/01/16 0835  .  sodium chloride    . methocarbamol (ROBAXIN)  IV        Antibiotics:   Anti-infectives    Start     Dose/Rate Route Frequency Ordered Stop   12/01/16 1000  azithromycin (ZITHROMAX) tablet 500 mg     500 mg Oral Every M-W-F 11/29/16 1050     11/30/16 1000  hydroxychloroquine (PLAQUENIL) tablet 200 mg     200 mg Oral Daily 11/29/16 1050     11/29/16 2130  hydroxychloroquine (PLAQUENIL) tablet 200 mg     200 mg Oral  Once 11/29/16 2026 11/29/16 2055   11/29/16 1800  ceFAZolin (ANCEF) IVPB 2g/100 mL premix     2 g 200 mL/hr over 30 Minutes Intravenous Every 6 hours 11/29/16 1757 11/30/16 0504   11/29/16 1200  ceFAZolin (ANCEF) 3 g in dextrose 5 % 50 mL IVPB  Status:  Discontinued    Comments:  Please send to OR tube station ASAP   3 g 130 mL/hr over 30 Minutes Intravenous  Once 11/29/16 1148 11/29/16 1201       Objective   Vitals:   11/30/16 0418 11/30/16 1431 11/30/16 2100 12/01/16 0930  BP: (!) 92/52 96/65 (!) 123/56 (!) 115/56  Pulse: (!) 107 96 93 91  Resp: _0 Temp: 98.9 F (37.2 C) 98.3 F (36.8 C) 98.2 F (36.8 C) 97.8 F (36.6 C)  TempSrc: Oral Oral Oral Oral  SpO2: 97% 97% 98% 92%  Weight:      Height:        Intake/Output Summary (Last 24 hours) at 12/01/16 1100 Last data filed at 12/01/16 0918  Gross per 24 hour  Intake              240 ml  Output  1510 ml  Net            -1270 ml   Filed Weights   11/29/16 0801  Weight: 124.3 kg (274 lb)     Physical Examination:  Physical Exam: Eyes: No icterus, extraocular muscles intact  Mouth: Oral mucosa is moist, no lesions on palate,  Neck: Supple, no deformities, masses, or tenderness Lungs: Normal respiratory effort, bilateral clear to auscultation, no crackles or wheezes.  Heart: Regular rate and rhythm, S1 and S2 normal, no murmurs, rubs auscultated Abdomen: BS normoactive,soft,nondistended,non-tender to palpation,no organomegaly Extremities: No pretibial edema, no erythema, no  cyanosis, no clubbing Neuro : Alert and oriented to time, place and person, No focal deficits Skin: No rashes seen on exam    Data Reviewed: I have personally reviewed following labs and imaging studies  CBG:  Recent Labs Lab 11/30/16 0613 11/30/16 1202 11/30/16 1653 11/30/16 2113 12/01/16 0623  GLUCAP 190* 162* 275* 175* 260*    CBC:  Recent Labs Lab 11/29/16 0928 11/30/16 0303 12/01/16 0323  WBC 11.1* 8.2 9.6  NEUTROABS 6.1  --   --   HGB 11.6* 9.1* 8.3*  HCT 37.7 30.7* 25.6*  MCV 96.4 96.2 93.8  PLT 150 141* 116*    Basic Metabolic Panel:  Recent Labs Lab 11/29/16 0928 11/30/16 0303 12/01/16 0323  NA 139 135 131*  K 3.1* 4.6 4.2  CL 96* 98* 95*  CO2 32 30 30  GLUCOSE 113* 190* 149*  BUN 21* 24* 24*  CREATININE 1.36* 1.43* 1.28*  CALCIUM 8.5* 7.8* 7.9*    Recent Results (from the past 240 hour(s))  Culture, blood (Routine X 2) w Reflex to ID Panel     Status: None (Preliminary result)   Collection Time: 11/29/16  2:50 PM  Result Value Ref Range Status   Specimen Description BLOOD RIGHT ANTECUBITAL  Final   Special Requests   Final    BOTTLES DRAWN AEROBIC AND ANAEROBIC Blood Culture adequate volume   Culture NO GROWTH < 24 HOURS  Final   Report Status PENDING  Incomplete  Culture, blood (Routine X 2) w Reflex to ID Panel     Status: None (Preliminary result)   Collection Time: 11/29/16  3:10 PM  Result Value Ref Range Status   Specimen Description BLOOD RIGHT ANTECUBITAL  Final   Special Requests   Final    BOTTLES DRAWN AEROBIC AND ANAEROBIC Blood Culture adequate volume   Culture NO GROWTH < 24 HOURS  Final   Report Status PENDING  Incomplete     Liver Function Tests:  Recent Labs Lab 11/29/16 1433  AST 22  ALT 29  ALKPHOS 59  BILITOT 0.6  PROT 5.0*  ALBUMIN 2.8*   No results for input(s): LIPASE, AMYLASE in the last 168 hours. No results for input(s): AMMONIA in the last 168 hours.  Cardiac Enzymes: No results for input(s):  CKTOTAL, CKMB, CKMBINDEX, TROPONINI in the last 168 hours. BNP (last 3 results) No results for input(s): BNP in the last 8760 hours.  ProBNP (last 3 results) No results for input(s): PROBNP in the last 8760 hours.    Studies: Dg C-arm 1-60 Min  Result Date: 11/29/2016 CLINICAL DATA:  Nailing of LEFT femoral intertrochanteric fracture EXAM: DG C-ARM 61-120 MIN; LEFT FEMUR 2 VIEWS COMPARISON:  11/29/2016 FLUOROSCOPY TIME:  2 minutes 57 seconds Images obtained: 6 FINDINGS: IM nail with proximal crest compression screw placed across an intertrochanteric fracture of the proximal LEFT femur. Significantly improved alignment at the fracture.  Diffuse osseous demineralization. No dislocation. Distal locking screw present. IMPRESSION: Post ORIF LEFT femur. Electronically Signed   By: Lavonia Dana M.D.   On: 11/29/2016 14:54   Dg Femur Min 2 Views Left  Result Date: 11/29/2016 CLINICAL DATA:  Nailing of LEFT femoral intertrochanteric fracture EXAM: DG C-ARM 61-120 MIN; LEFT FEMUR 2 VIEWS COMPARISON:  11/29/2016 FLUOROSCOPY TIME:  2 minutes 57 seconds Images obtained: 6 FINDINGS: IM nail with proximal crest compression screw placed across an intertrochanteric fracture of the proximal LEFT femur. Significantly improved alignment at the fracture. Diffuse osseous demineralization. No dislocation. Distal locking screw present. IMPRESSION: Post ORIF LEFT femur. Electronically Signed   By: Lavonia Dana M.D.   On: 11/29/2016 14:54    Scheduled Meds: . allopurinol  300 mg Oral Daily  . aspirin  325 mg Oral BID PC  . atorvastatin  10 mg Oral QPM  . azithromycin  500 mg Oral Q M,W,F  . docusate sodium  100 mg Oral BID  . DULoxetine  60 mg Oral Daily  . fluticasone  2 spray Each Nare Daily  . furosemide  20 mg Oral Daily  . gabapentin  1,200 mg Oral TID  . guaiFENesin  1,200 mg Oral BID  . hydroxychloroquine  200 mg Oral Daily  . insulin aspart  0-9 Units Subcutaneous TID WC  . levothyroxine  50 mcg Oral  Daily  . [START ON 12/02/2016] metoprolol succinate  50 mg Oral q morning - 10a  . olmesartan  20 mg Oral Daily  . pantoprazole  40 mg Oral Daily  . potassium chloride  30 mEq Oral Once  . predniSONE  10 mg Oral q morning - 10a      Time spent: 25 min  [facility] Pager [phone]. If 7PM-7AM, please contact night-coverage at [email or web address], Office  [phone]  password [email or web address]  12/01/2016, 11:00 AM  LOS: 2 days

## 2016-12-01 NOTE — Progress Notes (Signed)
RN held BP meds per MD verbal order. Patient's BP is 115/56.

## 2016-12-01 NOTE — NC FL2 (Addendum)
[place] MEDICAID FL2 LEVEL OF CARE SCREENING TOOL     IDENTIFICATION  Patient Name: [name] Birthdate: [birth year] Sex: female Admission Date (Current Location): 11/29/2016  [facility] and [place] Number:  Herbalist and Address:  [facility], [place] [address], [place], [place] [address]      Provider Number: [id number]  Attending Physician Name and Address:  Oswald Hillock, MD  Relative Name and Phone Number:       Current Level of Care: Hospital Recommended Level of Care: [place] Prior Approval Number:    Date Approved/Denied:   PASRR Number:  [id number]   Discharge Plan: SNF    Current Diagnoses: Patient Active Problem List   Diagnosis Date Noted  . Gout 11/29/2016  . Depression 11/29/2016  . Closed left hip fracture ([place]) 11/29/2016  . GERD (gastroesophageal reflux disease) 11/29/2016  . Closed intertrochanteric fracture of hip, left, initial encounter ([place]) 11/29/2016  . Physical deconditioning 07/31/2015  . Pulmonary fibrosis, postinflammatory ([place]) 07/31/2015  . Tendinitis of left rotator cuff 06/13/2015  . Acute on chronic diastolic heart failure ([place]) 04/21/2015  . Acute pulmonary edema (HCC)   . AKI (acute kidney injury) ([place])   . Peripheral edema   . Acute on chronic respiratory failure with hypoxia ([place]) 04/17/2015  . CAP (community acquired pneumonia) 04/17/2015  . Acute kidney injury superimposed on chronic kidney disease ([place]) 04/17/2015  . Hypotension 04/17/2015  . Chronic respiratory failure with hypoxia ([place]) 02/09/2015  . Dyspnea and respiratory abnormality 01/03/2015  . Normal coronary arteries 11/02/2014  . Morbid obesity ([place]) 10/30/2014  . Dysphagia 08/21/2014  . Sore throat 07/18/2014  . Chronic fatigue 06/21/2014  . DOE (dyspnea on exertion) 05/24/2014  . Primary osteoarthritis of left knee 04/26/2014  . High risk medication use 04/17/2014  . Aphasia 02/12/2014  . 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 Lumbar facet arthropathy ([place]) 01/18/2014  . Acute bronchitis 07/24/2013  . Bilateral edema of lower extremity 05/30/2013  . Chronic cough 05/24/2013  . ILD (interstitial lung disease) ([place]) 04/10/2013  . Greater trochanteric bursitis of left hip 08/13/2012  . Spinal stenosis of lumbar region with radiculopathy 07/12/2012  . Inability to walk 07/12/2012  . Acute lumbar back pain 07/12/2012  . Asymptomatic bacteriuria 07/12/2012  . 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 ([place]) 02/17/2012  . Benign neoplasm of meninges ([place]) 02/12/2012  . Brain mass 01/28/2012  . Chest pain with moderate risk of acute coronary syndrome 01/28/2012  . Rheumatoid arthritis ([place]) 01/28/2012  . Primary osteoarthritis of right knee 01/28/2012  . Dyslipidemia   . Thyroid disease   . Essential hypertension     Orientation RESPIRATION BLADDER Height & Weight     Self, Time, Situation, Place  Normal Continent Weight: 274 lb (124.3 kg) Height:  _0  (162.6 cm)  BEHAVIORAL SYMPTOMS/MOOD NEUROLOGICAL BOWEL NUTRITION STATUS      Continent  (Please see d/c summary)  AMBULATORY STATUS COMMUNICATION OF NEEDS Skin   Extensive Assist Verbally Surgical wounds (Closed incision left hip, Hydrocolloid dressing)                       Personal Care Assistance Level of Assistance  Bathing, Feeding, Dressing Bathing Assistance: Maximum assistance Feeding assistance: Limited assistance Dressing Assistance: Maximum assistance     Functional Limitations Info  Sight, Hearing, Speech Sight Info: Adequate Hearing Info: Adequate Speech Info: Adequate  SPECIAL CARE FACTORS FREQUENCY  PT (By licensed PT), OT (By licensed OT)     PT Frequency: 3x week OT Frequency: 3x week            Contractures Contractures Info: Not present    Additional Factors Info  Code Status, Allergies Code Status Info: Full Code Allergies Info:  Codeine, Remicade Infliximab, Zestril Lisinopril           Current Medications (12/01/2016):  This is the current hospital active medication list Current Facility-Administered Medications  Medication Dose Route Frequency Provider Last Rate Last Dose  . 0.9 %  sodium chloride infusion   Intravenous Continuous Oswald Hillock, MD 75 mL/hr at 12/01/16 [phone]    . 0.9 %  sodium chloride infusion   Intravenous Continuous [name], MD      . acetaminophen (TYLENOL) tablet 650 mg  650 mg Oral Q6H PRN [name], MD   650 mg at 11/30/16 0930   Or  . acetaminophen (TYLENOL) suppository 650 mg  650 mg Rectal Q6H PRN [name], MD      . albuterol (PROVENTIL) (2.5 MG/3ML) 0.083% nebulizer solution 2.5 mg  2.5 mg Nebulization Q4H PRN Rondel Jumbo, PA-C      . allopurinol (ZYLOPRIM) tablet 300 mg  300 mg Oral Daily Rondel Jumbo, PA-C   300 mg at 11/30/16 0929  . aspirin tablet 325 mg  325 mg Oral BID PC [name], MD   325 mg at 11/30/16 1630  . atorvastatin (LIPITOR) tablet 10 mg  10 mg Oral QPM Rondel Jumbo, PA-C   10 mg at 11/30/16 1630  . azithromycin (ZITHROMAX) tablet 500 mg  500 mg Oral Q M,W,F Wertman, Sara E, PA-C      . docusate sodium (COLACE) capsule 100 mg  100 mg Oral BID [name], MD   100 mg at 11/30/16 2025  . DULoxetine (CYMBALTA) DR capsule 60 mg  60 mg Oral Daily Rondel Jumbo, PA-C   60 mg at 11/30/16 0929  . fentaNYL (SUBLIMAZE) injection 50 mcg  50 mcg Intravenous Q3H PRN Rondel Jumbo, PA-C   50 mcg at 11/29/16 2004  . fluticasone (FLONASE) 50 MCG/ACT nasal spray 2 spray  2 spray Each Nare Daily Rondel Jumbo, PA-C      . furosemide (LASIX) tablet 20 mg  20 mg Oral Daily Sharene Butters E, PA-C   20 mg at 11/30/16 1000  . gabapentin (NEURONTIN) tablet 1,200 mg  1,200 mg Oral TID Rondel Jumbo, PA-C   1,200 mg at 11/30/16 2025  . guaiFENesin (MUCINEX) 12 hr tablet 1,200 mg  1,200 mg Oral BID Rondel Jumbo, PA-C   1,200 mg at 11/30/16 2025  . HYDROmorphone (DILAUDID) injection 1 mg  1 mg Intravenous Q2H PRN [name], MD   1 mg at 11/29/16 2056  . hydroxychloroquine (PLAQUENIL) tablet 200 mg  200 mg Oral Daily Rondel Jumbo, PA-C   200 mg at 11/30/16 0929  . insulin aspart (novoLOG) injection 0-9 Units  0-9 Units Subcutaneous TID WC Rondel Jumbo, PA-C   5 Units at 12/01/16 0802  . levothyroxine (SYNTHROID, LEVOTHROID) tablet 50 mcg  50 mcg Oral Daily Rondel Jumbo, PA-C   50 mcg at 11/30/16 0930  . menthol-cetylpyridinium (CEPACOL) lozenge 3 mg  1 lozenge Oral PRN [name], MD       Or  . phenol (CHLORASEPTIC) mouth spray 1 spray  1 spray Mouth/Throat PRN [name], MD      . methocarbamol (ROBAXIN) tablet 500 mg  500 mg Oral Q6H PRN [name], MD   500 mg at 11/30/16 0930   Or  . methocarbamol (ROBAXIN) 500 mg in dextrose 5 % 50 mL IVPB  500 mg Intravenous Q6H PRN [name], MD      . metoCLOPramide (REGLAN) tablet 5-10 mg  5-10 mg Oral Q8H PRN [name], MD       Or  . metoCLOPramide (REGLAN) injection 5-10 mg  5-10 mg Intravenous Q8H PRN [name], MD      . metoprolol succinate (TOPROL-XL) 24 hr tablet 100 mg  100 mg Oral q morning - 10a Rondel Jumbo, PA-C   100 mg at 11/30/16 0930  . olmesartan (BENICAR) tablet 20 mg  20 mg Oral Daily Oswald Hillock, MD      . ondansetron [facility]) tablet 4 mg  4 mg Oral Q6H PRN [name], MD       Or  . ondansetron [facility]) injection 4 mg  4 mg Intravenous Q6H PRN [name], MD      . pantoprazole (PROTONIX) EC tablet 40 mg  40 mg Oral Daily Sharene Butters E, PA-C   40 mg at 11/30/16 0930  . potassium chloride (K-DUR,KLOR-CON) CR tablet 30 mEq  30 mEq Oral Once Wertman, Sara E, PA-C      . predniSONE (DELTASONE) tablet 10 mg  10 mg Oral q morning - 10a Rondel Jumbo, PA-C   10 mg at 11/30/16 0929  . sodium phosphate  (FLEET) 7-19 GM/118ML enema 1 enema  1 enema Rectal Once PRN Rondel Jumbo, PA-C      . traMADol Veatrice [facility]bon) tablet 100 mg  100 mg Oral Q6H PRN [name], MD   100 mg at 11/30/16 2025     Discharge Medications: Please see discharge summary for a list of discharge medications.  Relevant Imaging Results:  Relevant Lab Results:   Additional Information SSN: [id number]  Eileen Stanford, LCSW

## 2016-12-02 ENCOUNTER — Telehealth: Payer: Self-pay | Admitting: *Deleted

## 2016-12-02 DIAGNOSIS — Z888 Allergy status to other drugs, medicaments and biological substances status: Secondary | ICD-10-CM | POA: Diagnosis not present

## 2016-12-02 DIAGNOSIS — T814XXA Infection following a procedure, initial encounter: Secondary | ICD-10-CM | POA: Diagnosis not present

## 2016-12-02 DIAGNOSIS — Z86011 Personal history of benign neoplasm of the brain: Secondary | ICD-10-CM | POA: Diagnosis not present

## 2016-12-02 DIAGNOSIS — E079 Disorder of thyroid, unspecified: Secondary | ICD-10-CM | POA: Diagnosis not present

## 2016-12-02 DIAGNOSIS — Z8049 Family history of malignant neoplasm of other genital organs: Secondary | ICD-10-CM | POA: Diagnosis not present

## 2016-12-02 DIAGNOSIS — Z9889 Other specified postprocedural states: Secondary | ICD-10-CM | POA: Diagnosis not present

## 2016-12-02 DIAGNOSIS — I959 Hypotension, unspecified: Secondary | ICD-10-CM | POA: Diagnosis not present

## 2016-12-02 DIAGNOSIS — Z4789 Encounter for other orthopedic aftercare: Secondary | ICD-10-CM | POA: Diagnosis not present

## 2016-12-02 DIAGNOSIS — E785 Hyperlipidemia, unspecified: Secondary | ICD-10-CM | POA: Diagnosis not present

## 2016-12-02 DIAGNOSIS — E039 Hypothyroidism, unspecified: Secondary | ICD-10-CM | POA: Diagnosis not present

## 2016-12-02 DIAGNOSIS — M05741 Rheumatoid arthritis with rheumatoid factor of right hand without organ or systems involvement: Secondary | ICD-10-CM | POA: Diagnosis not present

## 2016-12-02 DIAGNOSIS — Z4502 Encounter for adjustment and management of automatic implantable cardiac defibrillator: Secondary | ICD-10-CM | POA: Diagnosis not present

## 2016-12-02 DIAGNOSIS — Z794 Long term (current) use of insulin: Secondary | ICD-10-CM | POA: Diagnosis not present

## 2016-12-02 DIAGNOSIS — M069 Rheumatoid arthritis, unspecified: Secondary | ICD-10-CM | POA: Diagnosis not present

## 2016-12-02 DIAGNOSIS — B999 Unspecified infectious disease: Secondary | ICD-10-CM | POA: Diagnosis not present

## 2016-12-02 DIAGNOSIS — S72009A Fracture of unspecified part of neck of unspecified femur, initial encounter for closed fracture: Secondary | ICD-10-CM | POA: Diagnosis not present

## 2016-12-02 DIAGNOSIS — Z885 Allergy status to narcotic agent status: Secondary | ICD-10-CM | POA: Diagnosis not present

## 2016-12-02 DIAGNOSIS — I5033 Acute on chronic diastolic (congestive) heart failure: Secondary | ICD-10-CM | POA: Diagnosis not present

## 2016-12-02 DIAGNOSIS — M6281 Muscle weakness (generalized): Secondary | ICD-10-CM | POA: Diagnosis not present

## 2016-12-02 DIAGNOSIS — Z8249 Family history of ischemic heart disease and other diseases of the circulatory system: Secondary | ICD-10-CM | POA: Diagnosis not present

## 2016-12-02 DIAGNOSIS — Z833 Family history of diabetes mellitus: Secondary | ICD-10-CM | POA: Diagnosis not present

## 2016-12-02 DIAGNOSIS — S72142S Displaced intertrochanteric fracture of left femur, sequela: Secondary | ICD-10-CM | POA: Diagnosis not present

## 2016-12-02 DIAGNOSIS — I10 Essential (primary) hypertension: Secondary | ICD-10-CM | POA: Diagnosis not present

## 2016-12-02 DIAGNOSIS — S72142D Displaced intertrochanteric fracture of left femur, subsequent encounter for closed fracture with routine healing: Secondary | ICD-10-CM | POA: Diagnosis not present

## 2016-12-02 DIAGNOSIS — R41841 Cognitive communication deficit: Secondary | ICD-10-CM | POA: Diagnosis not present

## 2016-12-02 DIAGNOSIS — I5032 Chronic diastolic (congestive) heart failure: Secondary | ICD-10-CM | POA: Diagnosis not present

## 2016-12-02 DIAGNOSIS — Z8051 Family history of malignant neoplasm of kidney: Secondary | ICD-10-CM | POA: Diagnosis not present

## 2016-12-02 DIAGNOSIS — S72142A Displaced intertrochanteric fracture of left femur, initial encounter for closed fracture: Secondary | ICD-10-CM | POA: Diagnosis not present

## 2016-12-02 DIAGNOSIS — I2510 Atherosclerotic heart disease of native coronary artery without angina pectoris: Secondary | ICD-10-CM | POA: Diagnosis not present

## 2016-12-02 DIAGNOSIS — E1122 Type 2 diabetes mellitus with diabetic chronic kidney disease: Secondary | ICD-10-CM | POA: Diagnosis not present

## 2016-12-02 DIAGNOSIS — W19XXXA Unspecified fall, initial encounter: Secondary | ICD-10-CM

## 2016-12-02 DIAGNOSIS — R262 Difficulty in walking, not elsewhere classified: Secondary | ICD-10-CM | POA: Diagnosis not present

## 2016-12-02 DIAGNOSIS — Z801 Family history of malignant neoplasm of trachea, bronchus and lung: Secondary | ICD-10-CM | POA: Diagnosis not present

## 2016-12-02 DIAGNOSIS — M05742 Rheumatoid arthritis with rheumatoid factor of left hand without organ or systems involvement: Secondary | ICD-10-CM | POA: Diagnosis not present

## 2016-12-02 DIAGNOSIS — E119 Type 2 diabetes mellitus without complications: Secondary | ICD-10-CM | POA: Diagnosis not present

## 2016-12-02 DIAGNOSIS — D500 Iron deficiency anemia secondary to blood loss (chronic): Secondary | ICD-10-CM | POA: Diagnosis not present

## 2016-12-02 DIAGNOSIS — S72145G Nondisplaced intertrochanteric fracture of left femur, subsequent encounter for closed fracture with delayed healing: Secondary | ICD-10-CM | POA: Diagnosis not present

## 2016-12-02 DIAGNOSIS — E1140 Type 2 diabetes mellitus with diabetic neuropathy, unspecified: Secondary | ICD-10-CM | POA: Diagnosis not present

## 2016-12-02 DIAGNOSIS — J449 Chronic obstructive pulmonary disease, unspecified: Secondary | ICD-10-CM | POA: Diagnosis not present

## 2016-12-02 DIAGNOSIS — R279 Unspecified lack of coordination: Secondary | ICD-10-CM | POA: Diagnosis not present

## 2016-12-02 DIAGNOSIS — I96 Gangrene, not elsewhere classified: Secondary | ICD-10-CM | POA: Diagnosis not present

## 2016-12-02 DIAGNOSIS — I130 Hypertensive heart and chronic kidney disease with heart failure and stage 1 through stage 4 chronic kidney disease, or unspecified chronic kidney disease: Secondary | ICD-10-CM | POA: Diagnosis not present

## 2016-12-02 DIAGNOSIS — Z79899 Other long term (current) drug therapy: Secondary | ICD-10-CM | POA: Diagnosis not present

## 2016-12-02 DIAGNOSIS — F329 Major depressive disorder, single episode, unspecified: Secondary | ICD-10-CM | POA: Diagnosis not present

## 2016-12-02 DIAGNOSIS — E1142 Type 2 diabetes mellitus with diabetic polyneuropathy: Secondary | ICD-10-CM | POA: Diagnosis not present

## 2016-12-02 DIAGNOSIS — Z803 Family history of malignant neoplasm of breast: Secondary | ICD-10-CM | POA: Diagnosis not present

## 2016-12-02 DIAGNOSIS — S72002A Fracture of unspecified part of neck of left femur, initial encounter for closed fracture: Secondary | ICD-10-CM | POA: Diagnosis not present

## 2016-12-02 DIAGNOSIS — N189 Chronic kidney disease, unspecified: Secondary | ICD-10-CM | POA: Diagnosis not present

## 2016-12-02 DIAGNOSIS — S79911A Unspecified injury of right hip, initial encounter: Secondary | ICD-10-CM | POA: Diagnosis not present

## 2016-12-02 DIAGNOSIS — R05 Cough: Secondary | ICD-10-CM | POA: Diagnosis not present

## 2016-12-02 DIAGNOSIS — R1311 Dysphagia, oral phase: Secondary | ICD-10-CM | POA: Diagnosis not present

## 2016-12-02 DIAGNOSIS — T814XXD Infection following a procedure, subsequent encounter: Secondary | ICD-10-CM | POA: Diagnosis not present

## 2016-12-02 DIAGNOSIS — Z90710 Acquired absence of both cervix and uterus: Secondary | ICD-10-CM | POA: Diagnosis not present

## 2016-12-02 DIAGNOSIS — R278 Other lack of coordination: Secondary | ICD-10-CM | POA: Diagnosis not present

## 2016-12-02 DIAGNOSIS — Z841 Family history of disorders of kidney and ureter: Secondary | ICD-10-CM | POA: Diagnosis not present

## 2016-12-02 DIAGNOSIS — J849 Interstitial pulmonary disease, unspecified: Secondary | ICD-10-CM | POA: Diagnosis not present

## 2016-12-02 DIAGNOSIS — R600 Localized edema: Secondary | ICD-10-CM | POA: Diagnosis not present

## 2016-12-02 DIAGNOSIS — M109 Gout, unspecified: Secondary | ICD-10-CM | POA: Diagnosis not present

## 2016-12-02 LAB — CBC
HEMATOCRIT: 25 % — AB (ref 36.0–46.0)
HEMOGLOBIN: 7.9 g/dL — AB (ref 12.0–15.0)
MCH: 29.7 pg (ref 26.0–34.0)
MCHC: 31.6 g/dL (ref 30.0–36.0)
MCV: 94 fL (ref 78.0–100.0)
Platelets: 114 10*3/uL — ABNORMAL LOW (ref 150–400)
RBC: 2.66 MIL/uL — ABNORMAL LOW (ref 3.87–5.11)
RDW: 16 % — AB (ref 11.5–15.5)
WBC: 9.6 10*3/uL (ref 4.0–10.5)

## 2016-12-02 LAB — HEMOGLOBIN A1C
Hgb A1c MFr Bld: 6.5 % — ABNORMAL HIGH (ref 4.8–5.6)
Mean Plasma Glucose: 140 mg/dL

## 2016-12-02 LAB — GLUCOSE, CAPILLARY
GLUCOSE-CAPILLARY: 109 mg/dL — AB (ref 65–99)
GLUCOSE-CAPILLARY: 174 mg/dL — AB (ref 65–99)
GLUCOSE-CAPILLARY: 150 mg/dL — AB (ref 65–99)
Glucose-Capillary: 222 mg/dL — ABNORMAL HIGH (ref 65–99)

## 2016-12-02 MED ORDER — INSULIN ASPART 100 UNIT/ML ~~LOC~~ SOLN: 0.0000 [IU] | Freq: Three times a day (TID) | SUBCUTANEOUS | 11 refills | Status: DC

## 2016-12-02 MED ORDER — OLMESARTAN MEDOXOMIL 20 MG PO TABS: 20.0000 mg | ORAL_TABLET | Freq: Every day | ORAL | Status: DC

## 2016-12-02 MED ORDER — POLYETHYLENE GLYCOL 3350 17 G PO PACK: 17.0000 g | PACK | Freq: Every day | ORAL | 0 refills | Status: DC

## 2016-12-02 MED ORDER — DOCUSATE SODIUM 100 MG PO CAPS: 100.0000 mg | ORAL_CAPSULE | Freq: Two times a day (BID) | ORAL | 0 refills | Status: DC

## 2016-12-02 MED ORDER — METOPROLOL SUCCINATE ER 50 MG PO TB24: 50.0000 mg | ORAL_TABLET | Freq: Every morning | ORAL | Status: DC

## 2016-12-02 MED ORDER — INSULIN ASPART 100 UNIT/ML ~~LOC~~ SOLN
0.0000 [IU] | Freq: Three times a day (TID) | SUBCUTANEOUS | Status: DC
  Administered 2016-12-02: 3 [IU] via SUBCUTANEOUS

## 2016-12-02 MED ORDER — OLMESARTAN MEDOXOMIL 20 MG PO TABS
20.0000 mg | ORAL_TABLET | Freq: Every day | ORAL | Status: DC
  Administered 2016-12-02: 20 mg via ORAL
  Filled 2016-12-02: qty 1

## 2016-12-02 NOTE — Telephone Encounter (Signed)
Patient left a message to inform that she broke her hip on the 14th of July and will be going straight to rehab following discharge.  Patient wanted to make sure Zella Ball got this message. I informed Zella Ball

## 2016-12-02 NOTE — Social Work (Signed)
Clinical Social Worker facilitated patient discharge including contacting patient family and facility to confirm patient discharge plans.  Clinical information faxed to facility and family agreeable with plan.    CSW arranged ambulance transport via [facility] to [facility] at 3:30pm.    RN to call [phone] report prior to discharge. Pt going to Room 3213.  Clinical Social Worker will sign off for now as social work intervention is no longer needed. Please consult [place] again if new need arises.  Elissa Hefty, LCSW Clinical Social Worker [phone]

## 2016-12-02 NOTE — Progress Notes (Signed)
Occupational Therapy Treatment Patient Details Name: [name] MRN: [id number] DOB: 05-28-1940 Today's Date: 12/02/2016    History of present illness Pt is a 76 y.o. female with complex medical history. She fell at home sustaining L hip fx and underwent IM nailing 11-29-16.   OT comments  Pt demonstrates incr bed mobility, less anxiety and motivation for OOB to chair. Pt does not tolerate PWB restrictions and is weight bearing on L LE during transfers. .  Follow Up Recommendations  SNF;Supervision/Assistance - 24 hour    Equipment Recommendations  Hospital bed;Wheelchair cushion (measurements OT);Wheelchair (measurements OT)    Recommendations for Other Services      Precautions / Restrictions Precautions Precautions: Fall Restrictions Weight Bearing Restrictions: Yes LLE Weight Bearing: Partial weight bearing LLE Partial Weight Bearing Percentage or Pounds: 25-50%       Mobility Bed Mobility Overal bed mobility: Needs Assistance Bed Mobility: Rolling;Supine to Sit Rolling: Min guard   Supine to sit: Mod assist     General bed mobility comments: pt able to move R LE to EOB and (A) with L LE. pt needed total +2 to progress hips to EOB for better positioning  Transfers Overall transfer level: Needs assistance   Transfers: Sit to/from Stand Sit to Stand: +2 physical assistance;Max assist         General transfer comment: bed elevated and unable to maintain PWB L LE. pt requires seated position in stedy    Balance Overall balance assessment: Needs assistance Sitting-balance support: Bilateral upper extremity supported;Feet supported Sitting balance-Leahy Scale: Fair     Standing balance support: Bilateral upper extremity supported;During functional activity Standing balance-Leahy Scale: Poor                             ADL either performed or assessed with clinical judgement   ADL Overall ADL's : Needs assistance/impaired Eating/Feeding:  Independent   Grooming: Wash/dry hands;Wash/dry face;Supervision/safety;Sitting                   Toilet Transfer: +2 for physical assistance;Maximal assistance (stedy)             General ADL Comments: pt up in chair this session. pt with anxiety after transfer to chair and requesting oxyen. Pt encouraged to transfer with [place] and declined. pt immediately requesting once in chair. pt able to demonstrate pulse lip breathing and after a few second back to baseline.      Vision       Perception     Praxis      Cognition Arousal/Alertness: Awake/alert Behavior During Therapy: WFL for tasks assessed/performed Overall Cognitive Status: Within Functional Limits for tasks assessed                                          Exercises     Shoulder Instructions       General Comments dressing intact and dry    Pertinent Vitals/ Pain       Pain Assessment: Faces Faces Pain Scale: Hurts little more Pain Location: L hip during mobility Pain Descriptors / Indicators: Grimacing;Guarding;Operative site guarding Pain Intervention(s): Monitored during session;Premedicated before session;Repositioned;Ice applied  Home Living  Prior Functioning/Environment              Frequency  Min 2X/week        Progress Toward Goals  OT Goals(current goals can now be found in the care plan section)  Progress towards OT goals: Progressing toward goals  Acute Rehab OT Goals Patient Stated Goal: to get more therapy OT Goal Formulation: With patient Time For Goal Achievement: 12/14/16 Potential to Achieve Goals: Good ADL Goals Pt Will Transfer to Toilet: with +2 assist;with mod assist;squat pivot transfer Additional ADL Goal #1: PT WILL COMPLETE BED MOBILITY AT MIN (a) LEVEL AS PRECUROSE TO ADLS  Plan Discharge plan remains appropriate    Co-evaluation                 AM-PAC PT "6 Clicks"  Daily Activity     Outcome Measure   Help from another person eating meals?: None Help from another person taking care of personal grooming?: A Lot Help from another person toileting, which includes using toliet, bedpan, or urinal?: A Lot Help from another person bathing (including washing, rinsing, drying)?: A Lot Help from another person to put on and taking off regular upper body clothing?: A Lot Help from another person to put on and taking off regular lower body clothing?: A Lot 6 Click Score: 14    End of Session Equipment Utilized During Treatment: Gait belt;Oxygen  OT Visit Diagnosis: Unsteadiness on feet (R26.81)   Activity Tolerance Patient tolerated treatment well   Patient Left in chair;with call bell/phone within reach;with chair alarm set   Nurse Communication Mobility status;Precautions        Time: 7544-9201 OT Time Calculation (min): 17 min  Charges: OT General Charges $OT Visit: 1 Procedure OT Treatments $Therapeutic Activity: 8-22 mins   [name] Modena   OTR/L Pager: [phone] Office: [phone] .    Parke Poisson B 12/02/2016, 10:51 AM

## 2016-12-02 NOTE — Progress Notes (Signed)
Patient ID: [name], female   DOB: 10/27/1940, 76 y.o.   MRN: [id number] Dressings changed at the bedside on left hip.  Incisions all stable.  Working slowly with therapy and mobility.  Can be discharged to skilled nursing from an Ortho standpoint.

## 2016-12-02 NOTE — Progress Notes (Signed)
Called report to nurse Bill at [facility]. Reviewed HPI, PMH, recent vitals and lab results, diet and mobility concerns, as well as most recent bowel movement. Patient to discharge via [facility]. Will continue to monitor until time of discharge.

## 2016-12-02 NOTE — Discharge Summary (Signed)
Physician Discharge Summary  [name] QJJ:[id number] DOB: 07/02/1940 DOA: 11/29/2016  PCP: Glendale Chard, MD  Admit date: 11/29/2016 Discharge date: 12/02/2016  Time spent: 35 * minutes  Recommendations for Outpatient Follow-up:  1. Follow-up orthopedics in 2 weeks 2. Repeat CBC in 3 days on 12/05/2016   Discharge Diagnoses:  Principal Problem:   Closed intertrochanteric fracture of hip, left, initial encounter [facility]) Active Problems:   Dyslipidemia   Thyroid disease   Essential hypertension   Rheumatoid arthritis ([place])   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 (HCC)   ILD (interstitial lung disease) (HCC)   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)   Chronic fatigue   Morbid obesity (HCC)   Chronic respiratory failure with hypoxia (HCC)   Physical deconditioning   Gout   Depression   Closed left hip fracture (HCC)   GERD (gastroesophageal reflux disease)   Discharge Condition: Stable  Diet recommendation: Low-salt diet control carbohydrate modified diet  Filed Weights   11/29/16 0801  Weight: 124.3 kg (274 lb)    History of present illness:  76 y.o.femalewith a Past Medical History negative for thyroid disease, rheumatoid arthritis, coronary disease, hypertension, does not associate, diabetes, COPD, chronic kidney disease who presents with fall. Patient sustained a closed intertrochanteric fracture left hip.   Hospital Course:  1. Left hip fracture, status post intramedullary nail- orthopedics following, pain well controlled. Patient was discharged on MS Contin for pain control. Orthopedics has started aspirin 325 mg twice a day for DVT prophylaxis  For 30 days. 2. Diabetes mellitus with neuropathy- continue sliding scale insulin with NovoLog. 3. Chronic diastole CHF-echocardiogram shows grade 1 diastolic dysfunction, continue Lasix 20 mg by mouth daily 4. Hypotension-resolved, blood  pressure is better after  HCTZ has been  discontinued. Olmesartan reduced to  down to 20 mg by mouth daily.  also change the dose of XL 50 mg by mouth daily. 5. Interstitial lung disease- lungs clear to auscultation. Continue prednisone 10 mg by mouth daily.Oxygen via nasal cannula, Mucinex when necessary, when necessary albuterol nebulizer.continue Azithromycin 500 mg Monday -Wednesday- Friday. 6. Anemia-likely acute blood loss during surgery, postop hemoglobin down to 7.9. Repeat a CBC in 3 days. 7. Hypothyroidism-continue Synthroid 8. Hyperlipidemia-continue statin 9. Depression-continue Cymbalta 10. Gout-continue allopurinol 11. Rheumatoid arthritis- stable,continue hydroxychloroquine, prednisone  Procedures:  Status post intramedullary nail for left hip fracture  Consultations:  Orthopedics  Discharge Exam: Vitals:   12/02/16 0631 12/02/16 0937  BP: (!) 100/46 137/73  Pulse: (!) 105 (!) 111  Resp: (!) 22   Temp: 99.1 F (37.3 C)     General: Appears in no acute distress Cardiovascular: S1S2 RRR Respiratory: Clear bilaterally  Discharge Instructions   Discharge Instructions    Diet - low sodium heart healthy    Complete by:  As directed    Increase activity slowly    Complete by:  As directed    Partial weight bearing    Complete by:  As directed    Laterality:  left   Extremity:  Lower     Current Discharge Medication List    START taking these medications   Details  aspirin 325 MG tablet Take 1 tablet (325 mg total) by mouth 2 (two) times daily after a meal. Qty: 60 tablet, Refills: 0    docusate sodium (COLACE) 100 MG capsule Take 1 capsule (100 mg total) by mouth 2 (two) times daily. Qty: 10 capsule, Refills: 0    insulin aspart (NOVOLOG) 100  UNIT/ML injection Inject 0-9 Units into the skin 3 (three) times daily with meals. Sliding scale insulin Less than 70 initiate hypoglycemia protocol 70-120  0 units 120-150 1 unit 151-200 2 units 201-250 3 units 251-300 5 units 301-350 7  units 351-400 9 units  Greater than 400 call MD Qty: 10 mL, Refills: 11    olmesartan (BENICAR) 20 MG tablet Take 1 tablet (20 mg total) by mouth daily.    polyethylene glycol (MIRALAX / GLYCOLAX) packet Take 17 g by mouth daily. Qty: 14 each, Refills: 0      CONTINUE these medications which have CHANGED   Details  metoprolol succinate (TOPROL-XL) 50 MG 24 hr tablet Take 1 tablet (50 mg total) by mouth every morning. Take with or immediately following a meal.    morphine (MS CONTIN) 15 MG 12 hr tablet Take 1 tablet (15 mg total) by mouth every 12 (twelve) hours. Qty: 60 tablet, Refills: 0   Associated Diagnoses: Primary osteoarthritis of left knee; Primary osteoarthritis of right knee; Lumbar facet arthropathy (HCC)    morphine (MSIR) 15 MG tablet Take 1 tablet (15 mg total) by mouth every 8 (eight) hours as needed for severe pain. Qty: 30 tablet, Refills: 0   Associated Diagnoses: Primary osteoarthritis of left knee; Primary osteoarthritis of right knee; Lumbar facet arthropathy (HCC)      CONTINUE these medications which have NOT CHANGED   Details  acetaminophen (TYLENOL) 500 MG tablet Take 1,000 mg by mouth every 6 (six) hours as needed for moderate pain.     albuterol (PROVENTIL HFA;VENTOLIN HFA) 108 (90 BASE) MCG/ACT inhaler Inhale 2 puffs into the lungs every 6 (six) hours as needed for wheezing or shortness of breath.   Associated Diagnoses: ILD (interstitial lung disease) (HCC)    allopurinol (ZYLOPRIM) 300 MG tablet Take 300 mg by mouth daily.     atorvastatin (LIPITOR) 20 MG tablet Take 10 mg by mouth every evening.     azithromycin (ZITHROMAX) 500 MG tablet Take 1 tablet by mouth daily. TAKE ON MON, WED & FRI Refills: 0    Calcium Carb-Cholecalciferol (CALCIUM + D3 PO) Take 2 tablets by mouth daily with lunch.     cholecalciferol (VITAMIN D) 1000 UNITS tablet Take 2,000 Units by mouth daily with lunch.     cyclobenzaprine (FLEXERIL) 5 MG tablet Take 1 tablet (5 mg  total) by mouth at bedtime as needed for muscle spasms. Qty: 30 tablet, Refills: 1    DULoxetine (CYMBALTA) 60 MG capsule Take 1 capsule (60 mg total) by mouth daily. Qty: 30 capsule, Refills: 4   Associated Diagnoses: Chronic fatigue    fluticasone (FLONASE) 50 MCG/ACT nasal spray Place 2 sprays into both nostrils daily. Qty: 16 g, Refills: 3    furosemide (LASIX) 20 MG tablet Take 20 mg by mouth daily.  Refills: 1    gabapentin (NEURONTIN) 600 MG tablet Take two in the morning , Two mid- afternoon and two at bedtime Qty: 540 tablet, Refills: 0    guaiFENesin (MUCINEX) 600 MG 12 hr tablet Take 1,200 mg by mouth 2 (two) times daily.    hydroxychloroquine (PLAQUENIL) 200 MG tablet Take 200 mg by mouth daily.    levothyroxine (SYNTHROID, LEVOTHROID) 50 MCG tablet Take 1 tablet (50 mcg total) by mouth daily. Qty: 30 tablet, Refills: 1    Multiple Vitamin (MULTIVITAMIN WITH MINERALS) TABS Take 1 tablet by mouth daily with lunch.     OXYGEN-HELIUM IN Inhale 2 L into the lungs at bedtime.  And on exertion    pantoprazole (PROTONIX) 40 MG tablet take 1 tablet by mouth once daily MINUTES BEFORE 1ST MEAL OF THE DAY Qty: 30 tablet, Refills: 3    predniSONE (DELTASONE) 10 MG tablet Take 10 mg by mouth every morning. Alternating 7m and 7.563mRefills: 0    sodium chloride (OCEAN) 0.65 % SOLN nasal spray Place 1 spray into both nostrils as needed for congestion.    Spacer/Aero-Holding Chambers (AEROCHAMBER MV) inhaler Use as instructed Qty: 1 each, Refills: 0    VICTOZA 18 MG/3ML SOPN Inject 1.8 mg as directed daily.  Refills: 0      STOP taking these medications     aspirin EC 81 MG tablet      diclofenac sodium (VOLTAREN) 1 % GEL      olmesartan-hydrochlorothiazide (BENICAR HCT) 40-25 MG tablet        Allergies  Allergen Reactions  . Codeine Swelling    Facial swelling  . Remicade [Infliximab] Anaphylaxis    "sent me into shock"  . Zestril [Lisinopril] Swelling    Face  and neck swelling    Contact information for follow-up providers    BlMcarthur RossettiMD. Schedule an appointment as soon as possible for a visit in 2 week(s).   Specialty:  Orthopedic Surgery Contact information: [id number] [phone]          Contact information for after-discharge care    [facility] .   Specialty:  [place]: [phone]                 The results of significant diagnostics from this hospitalization (including imaging, microbiology, ancillary and laboratory) are listed below for reference.    Significant Diagnostic Studies: Dg Chest Port 1 View  Result Date: 11/29/2016 CLINICAL DATA:  Left hip fracture.  Preop evaluation. EXAM: PORTABLE CHEST 1 VIEW COMPARISON:  07/30/2016. FINDINGS: Interval borderline enlarged cardiac silhouette, magnified by the portable AP technique. Clear lungs with normal vascularity. Unremarkable bones. IMPRESSION: Borderline cardiomegaly.  No acute abnormality. Electronically Signed   By: StClaudie Revering.D.   On: 11/29/2016 09:23   Dg Ankle Left Port  Result Date: 11/29/2016 CLINICAL DATA:  Anterior and lateral ankle pain.  Fall. EXAM: PORTABLE LEFT ANKLE - 2 VIEW COMPARISON:  None. FINDINGS: Posterior and plantar calcaneal spurs. Diffuse soft tissue swelling. No fracture, subluxation or dislocation. IMPRESSION: No acute bony abnormality. Electronically Signed   By: KeRolm Baptise.D.   On: 11/29/2016 10:54   Dg C-arm 1-60 Min  Result Date: 11/29/2016 CLINICAL DATA:  Nailing of LEFT femoral intertrochanteric fracture EXAM: DG C-ARM 61-120 MIN; LEFT FEMUR 2 VIEWS COMPARISON:  11/29/2016 FLUOROSCOPY TIME:  2 minutes 57 seconds Images obtained: 6 FINDINGS: IM nail with proximal crest compression screw placed across an intertrochanteric fracture of the  proximal LEFT femur. Significantly improved alignment at the fracture. Diffuse osseous demineralization. No dislocation. Distal locking screw present. IMPRESSION: Post ORIF LEFT femur. Electronically Signed   By: MaLavonia Dana.D.   On: 11/29/2016 14:54   Dg Hip Unilat With Pelvis 2-3 Views Left  Result Date: 11/29/2016 CLINICAL DATA:  Left hip pain radiating into the left leg after falling this morning and landing on the hip. EXAM: DG HIP (WITH OR WITHOUT PELVIS) 2-3V LEFT COMPARISON:  None. FINDINGS: Comminuted left intertrochanteric fracture with mild anterior displacement  of the distal fragment. No significant angulation. IMPRESSION: Comminuted left intertrochanteric fracture. Electronically Signed   By: Claudie Revering M.D.   On: 11/29/2016 08:51   Dg Femur Min 2 Views Left  Result Date: 11/29/2016 CLINICAL DATA:  Nailing of LEFT femoral intertrochanteric fracture EXAM: DG C-ARM 61-120 MIN; LEFT FEMUR 2 VIEWS COMPARISON:  11/29/2016 FLUOROSCOPY TIME:  2 minutes 57 seconds Images obtained: 6 FINDINGS: IM nail with proximal crest compression screw placed across an intertrochanteric fracture of the proximal LEFT femur. Significantly improved alignment at the fracture. Diffuse osseous demineralization. No dislocation. Distal locking screw present. IMPRESSION: Post ORIF LEFT femur. Electronically Signed   By: Lavonia Dana M.D.   On: 11/29/2016 14:54    Microbiology: Recent Results (from the past 240 hour(s))  Culture, blood (Routine X 2) w Reflex to ID Panel     Status: None (Preliminary result)   Collection Time: 11/29/16  2:50 PM  Result Value Ref Range Status   Specimen Description BLOOD RIGHT ANTECUBITAL  Final   Special Requests   Final    BOTTLES DRAWN AEROBIC AND ANAEROBIC Blood Culture adequate volume   Culture NO GROWTH 2 DAYS  Final   Report Status PENDING  Incomplete  Culture, blood (Routine X 2) w Reflex to ID Panel     Status: None (Preliminary result)   Collection Time: 11/29/16  3:10  PM  Result Value Ref Range Status   Specimen Description BLOOD RIGHT ANTECUBITAL  Final   Special Requests   Final    BOTTLES DRAWN AEROBIC AND ANAEROBIC Blood Culture adequate volume   Culture NO GROWTH 2 DAYS  Final   Report Status PENDING  Incomplete     Labs: Basic Metabolic Panel:  Recent Labs Lab 11/29/16 0928 11/30/16 0303 12/01/16 0323  NA 139 135 131*  K 3.1* 4.6 4.2  CL 96* 98* 95*  CO2 32 30 30  GLUCOSE 113* 190* 149*  BUN 21* 24* 24*  CREATININE 1.36* 1.43* 1.28*  CALCIUM 8.5* 7.8* 7.9*   Liver Function Tests:  Recent Labs Lab 11/29/16 1433  AST 22  ALT 29  ALKPHOS 59  BILITOT 0.6  PROT 5.0*  ALBUMIN 2.8*   No results for input(s): LIPASE, AMYLASE in the last 168 hours. No results for input(s): AMMONIA in the last 168 hours. CBC:  Recent Labs Lab 11/29/16 0928 11/30/16 0303 12/01/16 0323 12/02/16 0518  WBC 11.1* 8.2 9.6 9.6  NEUTROABS 6.1  --   --   --   HGB 11.6* 9.1* 8.3* 7.9*  HCT 37.7 30.7* 25.6* 25.0*  MCV 96.4 96.2 93.8 94.0  PLT 150 141* 116* 114*   Cardiac Enzymes: No results for input(s): CKTOTAL, CKMB, CKMBINDEX, TROPONINI in the last 168 hours. BNP: BNP (last 3 results) No results for input(s): BNP in the last 8760 hours.  ProBNP (last 3 results) No results for input(s): PROBNP in the last 8760 hours.  CBG:  Recent Labs Lab 12/01/16 1209 12/01/16 1716 12/01/16 2226 12/02/16 0628 12/02/16 1211  GLUCAP 124* 200* 131* 109* 174*       Signed:  Eleonore Chiquito S MD.  [facility] 12/02/2016, 12:40 PM

## 2016-12-02 NOTE — Clinical Social Work Placement (Signed)
   CLINICAL SOCIAL WORK PLACEMENT  NOTE  Date:  12/02/2016  Patient Details  Name: [name] MRN: [id number] Date of Birth: 08/17/1940  Clinical Social Work is seeking post-discharge placement for this patient at the [place] level of care (*CSW will initial, date and re-position this form in  chart as items are completed):  Yes   Patient/family provided with [place] Work Department's list of facilities offering this level of care within the geographic area requested by the patient (or if unable, by the patient's family).  Yes   Patient/family informed of their freedom to choose among providers that offer the needed level of care, that participate in Medicare, Medicaid or managed care program needed by the patient, have an available bed and are willing to accept the patient.  Yes   Patient/family informed of Bureau's ownership interest in [facility] and [facility], as well as of the fact that they are under no obligation to receive care at these facilities.  PASRR submitted to EDS on       PASRR number received on 12/01/16     Existing PASRR number confirmed on 12/01/16     FL2 transmitted to all facilities in geographic area requested by pt/family on 12/01/16     FL2 transmitted to all facilities within larger geographic area on 12/01/16     Patient informed that his/her managed care company has contracts with or will negotiate with certain facilities, including the following:        Yes   Patient/family informed of bed offers received.  Patient chooses bed at [facility]     Physician recommends and patient chooses bed at      Patient to be transferred to [facility] on 12/02/16.  Patient to be transferred to facility by [facility]     Patient family notified on 12/02/16 of transfer.  Name of family member notified:  Patient is responsible for self and will notify spouse and family on her  own     PHYSICIAN Please prepare priority discharge summary, including medications, Please prepare prescriptions, Please sign FL2     Additional Comment:    _______________________________________________ Normajean Baxter, LCSW 12/02/2016, 12:37 PM

## 2016-12-02 NOTE — Clinical Social Work Note (Signed)
Clinical Social Work Assessment  Patient Details  Name: [name] MRN: [id number] Date of Birth: 06-01-1940  Date of referral:  12/02/16               Reason for consult:  Facility Placement                Permission sought to share information with:  Facility Art therapist granted to share information::  Yes, Verbal Permission Granted  Name::        Agency::  SNF  Relationship::     Contact Information:     Housing/Transportation Living arrangements for the past 2 months:  Single Family Home Source of Information:  Patient Patient Interpreter Needed:  None Criminal Activity/Legal Involvement Pertinent to Current Situation/Hospitalization:  No - Comment as needed Significant Relationships:  Adult Children, Other Family Members Lives with:  Spouse Do you feel safe going back to the place where you live?  No Need for family participation in patient care:  No (Coment)  Care giving concerns:  Pt resided at home with spouse prior to hospitalization and is in need of short term rehab as her husband is unable to care for her with impairment.  Social Worker assessment / plan:  CSW met with patient at bedside to discuss clinical teams recommendation for short term rehab.  Pt in agreement and has experience with SNF in the past. CSW discussed her role and SNF options/placement. Pt would like to go to [place] or [facility]. CSW will f/u.  Fl2 complete. Passr obtained. Bed offers sent.  Employment status:  Retired Forensic scientist:  Medicare PT Recommendations:  [place] / Referral to community resources:  [place]  Patient/Family's Response to care:  Patient appreciative of CSW assistance with placement. No issues or concerns with care at this time.  Patient/Family's Understanding of and Emotional Response to Diagnosis, Current Treatment, and Prognosis:  Patient has good understanding of diagnosis,  current treatment and prognosis. Pt is hopeful that short term rehab will address impairment. No issues or concerns at this time.  Emotional Assessment Appearance:  Appears stated age Attitude/Demeanor/Rapport:   (Cooperative) Affect (typically observed):  Accepting, Appropriate Orientation:  Oriented to Self, Oriented to Place, Oriented to  Time, Oriented to Situation Alcohol / Substance use:  Not Applicable Psych involvement (Current and /or in the community):  No (Comment)  Discharge Needs  Concerns to be addressed:  Care Coordination Readmission within the last 30 days:  No Current discharge risk:  Dependent with Mobility, Physical Impairment Barriers to Discharge:  No Barriers Identified   Normajean Baxter, LCSW 12/02/2016, 12:27 PM

## 2016-12-03 ENCOUNTER — Telehealth (INDEPENDENT_AMBULATORY_CARE_PROVIDER_SITE_OTHER): Payer: Self-pay | Admitting: Orthopaedic Surgery

## 2016-12-03 ENCOUNTER — Telehealth (INDEPENDENT_AMBULATORY_CARE_PROVIDER_SITE_OTHER): Payer: Self-pay | Admitting: Radiology

## 2016-12-03 DIAGNOSIS — J849 Interstitial pulmonary disease, unspecified: Secondary | ICD-10-CM | POA: Diagnosis not present

## 2016-12-03 DIAGNOSIS — S72142D Displaced intertrochanteric fracture of left femur, subsequent encounter for closed fracture with routine healing: Secondary | ICD-10-CM | POA: Diagnosis not present

## 2016-12-03 DIAGNOSIS — M069 Rheumatoid arthritis, unspecified: Secondary | ICD-10-CM | POA: Diagnosis not present

## 2016-12-03 DIAGNOSIS — E1142 Type 2 diabetes mellitus with diabetic polyneuropathy: Secondary | ICD-10-CM | POA: Diagnosis not present

## 2016-12-03 DIAGNOSIS — E039 Hypothyroidism, unspecified: Secondary | ICD-10-CM | POA: Diagnosis not present

## 2016-12-03 DIAGNOSIS — D500 Iron deficiency anemia secondary to blood loss (chronic): Secondary | ICD-10-CM | POA: Diagnosis not present

## 2016-12-03 DIAGNOSIS — Z4502 Encounter for adjustment and management of automatic implantable cardiac defibrillator: Secondary | ICD-10-CM | POA: Diagnosis not present

## 2016-12-03 DIAGNOSIS — S72002A Fracture of unspecified part of neck of left femur, initial encounter for closed fracture: Secondary | ICD-10-CM | POA: Diagnosis not present

## 2016-12-03 LAB — DRUG TOX MONITOR 1 W/CONF, ORAL FLD
AMPHETAMINES: NEGATIVE ng/mL (ref <10)
BARBITURATES: NEGATIVE ng/mL (ref <10)
BENZODIAZEPINES: NEGATIVE ng/mL (ref <0.50)
Buprenorphine: NEGATIVE ng/mL (ref <0.025)
COCAINE: NEGATIVE ng/mL (ref <2.5)
FENTANYL: NEGATIVE ng/mL (ref <0.10)
Heroin Metabolite: NEGATIVE ng/mL (ref <1.0)
MDMA: NEGATIVE ng/mL (ref <10)
MEPROBAMATE: NEGATIVE ng/mL (ref <2.5)
Marijuana: NEGATIVE ng/mL (ref <2.5)
Meperidine: NEGATIVE ng/mL (ref <5.0)
Methadone: NEGATIVE ng/mL (ref <5.0)
Nicotine Metabolite: NEGATIVE ng/mL (ref <5.0)
Opiates: NEGATIVE ng/mL (ref <2.5)
PHENCYCLIDINE: NEGATIVE ng/mL (ref <10)
PROPOXYPHENE: NEGATIVE ng/mL (ref <5.0)
TRAMADOL: NEGATIVE ng/mL (ref <5.0)
Tapentadol: NEGATIVE ng/mL (ref <5.0)
Zolpidem: NEGATIVE ng/mL (ref <5.0)

## 2016-12-03 LAB — DRUG TOX ALC METAB W/CON, ORAL FLD: Alcohol Metabolite: NEGATIVE ng/mL (ref <25)

## 2016-12-03 LAB — HEMOGLOBIN A1C
HEMOGLOBIN A1C: 6.3 % — AB (ref 4.8–5.6)
MEAN PLASMA GLUCOSE: 134 mg/dL

## 2016-12-03 NOTE — Telephone Encounter (Signed)
[name] is requesting dressing change instructions for patient post left hip surgery 11/29/16. She is changing dressing with guaze and tape, due to bandage being full of fluid, ok'd by [employer]. She stated she had not yet removed bandage to check type of fluid.  Fax number [phone]

## 2016-12-03 NOTE — Telephone Encounter (Signed)
I have s/w daughter and advised that I called the CSW at hospital will call the director of nursing at [facility] and speak with them about all this.  The d/c papers etc were sent to [facility] through the hub.  Ena Dawley says that have now changed the dressing on the incision.  I have told her to call me with anything else we can assist with.

## 2016-12-03 NOTE — Telephone Encounter (Signed)
Per Artis Delay, change to dry dressing and leave it until follow up appt unless she continues to have increased drainage. If concerns, we can work patient in sooner.

## 2016-12-03 NOTE — Telephone Encounter (Signed)
Patient's daughter Pamalee Leyden called advised her mother is in [facility] and she was told they do not have orders to change patient's bandages. Ena Dawley said her mother can not get a bath or anything. Patient also need order for (PT). Tunina asked for a call back as soon as possible. She advised her bandages is full of blood. Ena Dawley said her mother came to [facility] yesterday. The number to contact [place] is [phone]

## 2016-12-03 NOTE — Progress Notes (Signed)
Patient leaving the unit via [facility] to go to [facility].  Patient's blood pressure was running soft 103/49.  Spoke with Nursing Supervisor at [facility] name Merriel.  Both [facility] and Supervisor gave the okay for transport tonight.  Patient is alert and oriented x 4.  She is looking forward to making the trip and getting some rest.

## 2016-12-03 NOTE — Telephone Encounter (Signed)
Faxed script advising.

## 2016-12-04 ENCOUNTER — Ambulatory Visit (INDEPENDENT_AMBULATORY_CARE_PROVIDER_SITE_OTHER): Payer: Medicare Other | Admitting: Orthopaedic Surgery

## 2016-12-04 DIAGNOSIS — E1142 Type 2 diabetes mellitus with diabetic polyneuropathy: Secondary | ICD-10-CM | POA: Diagnosis not present

## 2016-12-04 DIAGNOSIS — S72142A Displaced intertrochanteric fracture of left femur, initial encounter for closed fracture: Secondary | ICD-10-CM

## 2016-12-04 DIAGNOSIS — M069 Rheumatoid arthritis, unspecified: Secondary | ICD-10-CM | POA: Diagnosis not present

## 2016-12-04 DIAGNOSIS — J849 Interstitial pulmonary disease, unspecified: Secondary | ICD-10-CM | POA: Diagnosis not present

## 2016-12-04 DIAGNOSIS — S72142D Displaced intertrochanteric fracture of left femur, subsequent encounter for closed fracture with routine healing: Secondary | ICD-10-CM | POA: Diagnosis not present

## 2016-12-04 LAB — CULTURE, BLOOD (ROUTINE X 2)
Culture: NO GROWTH
Culture: NO GROWTH
SPECIAL REQUESTS: ADEQUATE
Special Requests: ADEQUATE

## 2016-12-04 NOTE — Progress Notes (Signed)
The patient's very pleasant morbidly obese 76 year old who is 5 days post open reduction and fixation of a severely comminuted left intertrochanteric hip fracture. She is at the skilled nursing facility because Jori Moll D touchdown weight-bear. Base in her back here today due to significant drainage from her hip incision.  On examination she has abundant serous drainage to be expected given the amount of surgery as well as adipose tissue. There is no evidence infection.  I gave notes to the nursing facility to change her dressing at minimum daily but at least several times a day until eventually dries up which we expected to. I'll see her back at her regular scheduled visit the week after next. I would like an AP and lateral of her left hip at that visit.

## 2016-12-10 ENCOUNTER — Telehealth: Payer: Self-pay | Admitting: Rheumatology

## 2016-12-10 DIAGNOSIS — I10 Essential (primary) hypertension: Secondary | ICD-10-CM | POA: Diagnosis not present

## 2016-12-10 DIAGNOSIS — J849 Interstitial pulmonary disease, unspecified: Secondary | ICD-10-CM | POA: Diagnosis not present

## 2016-12-10 DIAGNOSIS — M069 Rheumatoid arthritis, unspecified: Secondary | ICD-10-CM | POA: Diagnosis not present

## 2016-12-10 DIAGNOSIS — S72142D Displaced intertrochanteric fracture of left femur, subsequent encounter for closed fracture with routine healing: Secondary | ICD-10-CM | POA: Diagnosis not present

## 2016-12-10 NOTE — Telephone Encounter (Signed)
Patient is requesting a rx for prednisone. She has broken her hip in several places and is currently at [facility]. She states she has been having quite a few issues after breaking her hip and she thinks she could get relief from prednisone. She had an appointment on 12/18/16 with Mr. Carlyon Shadow that I called to reschedule and she states she will call back to reschedule once she is discharged from the facility.

## 2016-12-11 NOTE — Telephone Encounter (Signed)
Attempted to contact the patient and left message for patient to call the office.  

## 2016-12-12 NOTE — Telephone Encounter (Signed)
Please advise 

## 2016-12-15 ENCOUNTER — Telehealth (INDEPENDENT_AMBULATORY_CARE_PROVIDER_SITE_OTHER): Payer: Self-pay | Admitting: Orthopaedic Surgery

## 2016-12-15 NOTE — Telephone Encounter (Signed)
Please discuss with patient that Prednisone will interfere with bone healing.

## 2016-12-15 NOTE — Telephone Encounter (Signed)
Returned call to patient confirming appointment time and date with Dr. Ninfa Linden [phone]

## 2016-12-15 NOTE — Telephone Encounter (Signed)
Attempted to contact the patient and unable to leave a message.

## 2016-12-17 ENCOUNTER — Ambulatory Visit (INDEPENDENT_AMBULATORY_CARE_PROVIDER_SITE_OTHER): Payer: Medicare Other | Admitting: Orthopaedic Surgery

## 2016-12-17 ENCOUNTER — Ambulatory Visit (INDEPENDENT_AMBULATORY_CARE_PROVIDER_SITE_OTHER): Payer: Medicare Other

## 2016-12-17 DIAGNOSIS — S72145G Nondisplaced intertrochanteric fracture of left femur, subsequent encounter for closed fracture with delayed healing: Secondary | ICD-10-CM | POA: Diagnosis not present

## 2016-12-17 NOTE — Telephone Encounter (Signed)
Patient advised Dr. Estanislado Pandy would not prescribe prednisone as it would interfere with bone healing. patient verbalized understanding.

## 2016-12-17 NOTE — Progress Notes (Signed)
The patient is a very pleasant morbidly obese 76 year old who is 18 days out from open reduction internal fixation of a complex intertrochanteric hip fracture. She is doing well overall. This had her nonweightbearing.  On examination her staple lines ligaments are moved all the staples and placed new dressings. She tolerates me better putting her hip the range of motion. X-rays of the left hip show intact hardware from surgical fixation of her proximal hip fracture but is too early to see signs of healing.  At this point I will let her 50% weight-bear on that hip. I gave notes to the nursing center to attest that back. I'll see her back in 4 weeks with a repeat AP and lateral of her left hip only.

## 2016-12-18 ENCOUNTER — Ambulatory Visit: Payer: Medicare Other | Admitting: Rheumatology

## 2016-12-18 ENCOUNTER — Telehealth: Payer: Self-pay | Admitting: *Deleted

## 2016-12-18 NOTE — Telephone Encounter (Signed)
Oral swab drug screen was inconsistent for prescribed medications. [name] reported having taken her MSIR on the morning of the test but there were no metabolites or parent drug present in her saliva. She reported NOT taking her extended release morphine in about a month.

## 2016-12-19 DIAGNOSIS — S72142D Displaced intertrochanteric fracture of left femur, subsequent encounter for closed fracture with routine healing: Secondary | ICD-10-CM | POA: Diagnosis not present

## 2016-12-19 DIAGNOSIS — I5032 Chronic diastolic (congestive) heart failure: Secondary | ICD-10-CM | POA: Diagnosis not present

## 2016-12-19 DIAGNOSIS — M069 Rheumatoid arthritis, unspecified: Secondary | ICD-10-CM | POA: Diagnosis not present

## 2016-12-19 DIAGNOSIS — J849 Interstitial pulmonary disease, unspecified: Secondary | ICD-10-CM | POA: Diagnosis not present

## 2016-12-21 DIAGNOSIS — M069 Rheumatoid arthritis, unspecified: Secondary | ICD-10-CM | POA: Diagnosis not present

## 2016-12-21 DIAGNOSIS — S72142D Displaced intertrochanteric fracture of left femur, subsequent encounter for closed fracture with routine healing: Secondary | ICD-10-CM | POA: Diagnosis not present

## 2016-12-21 DIAGNOSIS — I5032 Chronic diastolic (congestive) heart failure: Secondary | ICD-10-CM | POA: Diagnosis not present

## 2016-12-21 DIAGNOSIS — J849 Interstitial pulmonary disease, unspecified: Secondary | ICD-10-CM | POA: Diagnosis not present

## 2016-12-24 ENCOUNTER — Encounter: Payer: Medicare Other | Attending: Physical Medicine & Rehabilitation | Admitting: Registered Nurse

## 2016-12-24 DIAGNOSIS — J449 Chronic obstructive pulmonary disease, unspecified: Secondary | ICD-10-CM | POA: Insufficient documentation

## 2016-12-24 DIAGNOSIS — I129 Hypertensive chronic kidney disease with stage 1 through stage 4 chronic kidney disease, or unspecified chronic kidney disease: Secondary | ICD-10-CM | POA: Insufficient documentation

## 2016-12-24 DIAGNOSIS — M069 Rheumatoid arthritis, unspecified: Secondary | ICD-10-CM | POA: Insufficient documentation

## 2016-12-24 DIAGNOSIS — E7800 Pure hypercholesterolemia, unspecified: Secondary | ICD-10-CM | POA: Insufficient documentation

## 2016-12-24 DIAGNOSIS — M62838 Other muscle spasm: Secondary | ICD-10-CM | POA: Insufficient documentation

## 2016-12-24 DIAGNOSIS — M48062 Spinal stenosis, lumbar region with neurogenic claudication: Secondary | ICD-10-CM | POA: Insufficient documentation

## 2016-12-24 DIAGNOSIS — E079 Disorder of thyroid, unspecified: Secondary | ICD-10-CM | POA: Insufficient documentation

## 2016-12-24 DIAGNOSIS — M7062 Trochanteric bursitis, left hip: Secondary | ICD-10-CM | POA: Insufficient documentation

## 2016-12-24 DIAGNOSIS — Z808 Family history of malignant neoplasm of other organs or systems: Secondary | ICD-10-CM | POA: Insufficient documentation

## 2016-12-24 DIAGNOSIS — F329 Major depressive disorder, single episode, unspecified: Secondary | ICD-10-CM | POA: Insufficient documentation

## 2016-12-24 DIAGNOSIS — Z8249 Family history of ischemic heart disease and other diseases of the circulatory system: Secondary | ICD-10-CM | POA: Insufficient documentation

## 2016-12-24 DIAGNOSIS — M170 Bilateral primary osteoarthritis of knee: Secondary | ICD-10-CM | POA: Insufficient documentation

## 2016-12-24 DIAGNOSIS — M545 Low back pain: Secondary | ICD-10-CM | POA: Insufficient documentation

## 2016-12-24 DIAGNOSIS — N189 Chronic kidney disease, unspecified: Secondary | ICD-10-CM | POA: Insufficient documentation

## 2016-12-24 DIAGNOSIS — E1142 Type 2 diabetes mellitus with diabetic polyneuropathy: Secondary | ICD-10-CM | POA: Insufficient documentation

## 2016-12-24 DIAGNOSIS — D689 Coagulation defect, unspecified: Secondary | ICD-10-CM | POA: Insufficient documentation

## 2016-12-24 DIAGNOSIS — E1122 Type 2 diabetes mellitus with diabetic chronic kidney disease: Secondary | ICD-10-CM | POA: Insufficient documentation

## 2016-12-24 DIAGNOSIS — Z8051 Family history of malignant neoplasm of kidney: Secondary | ICD-10-CM | POA: Insufficient documentation

## 2016-12-24 DIAGNOSIS — Z803 Family history of malignant neoplasm of breast: Secondary | ICD-10-CM | POA: Insufficient documentation

## 2016-12-24 DIAGNOSIS — J8410 Pulmonary fibrosis, unspecified: Secondary | ICD-10-CM | POA: Insufficient documentation

## 2016-12-24 DIAGNOSIS — Z801 Family history of malignant neoplasm of trachea, bronchus and lung: Secondary | ICD-10-CM | POA: Insufficient documentation

## 2016-12-24 DIAGNOSIS — R200 Anesthesia of skin: Secondary | ICD-10-CM | POA: Insufficient documentation

## 2016-12-24 DIAGNOSIS — Z9889 Other specified postprocedural states: Secondary | ICD-10-CM | POA: Insufficient documentation

## 2016-12-24 DIAGNOSIS — Z833 Family history of diabetes mellitus: Secondary | ICD-10-CM | POA: Insufficient documentation

## 2016-12-26 DIAGNOSIS — S72142D Displaced intertrochanteric fracture of left femur, subsequent encounter for closed fracture with routine healing: Secondary | ICD-10-CM | POA: Diagnosis not present

## 2016-12-26 DIAGNOSIS — M069 Rheumatoid arthritis, unspecified: Secondary | ICD-10-CM | POA: Diagnosis not present

## 2016-12-26 DIAGNOSIS — E1142 Type 2 diabetes mellitus with diabetic polyneuropathy: Secondary | ICD-10-CM | POA: Diagnosis not present

## 2016-12-26 DIAGNOSIS — I5032 Chronic diastolic (congestive) heart failure: Secondary | ICD-10-CM | POA: Diagnosis not present

## 2016-12-31 DIAGNOSIS — R600 Localized edema: Secondary | ICD-10-CM | POA: Diagnosis not present

## 2016-12-31 DIAGNOSIS — F329 Major depressive disorder, single episode, unspecified: Secondary | ICD-10-CM | POA: Diagnosis not present

## 2016-12-31 DIAGNOSIS — E1140 Type 2 diabetes mellitus with diabetic neuropathy, unspecified: Secondary | ICD-10-CM | POA: Diagnosis not present

## 2016-12-31 DIAGNOSIS — M069 Rheumatoid arthritis, unspecified: Secondary | ICD-10-CM | POA: Diagnosis not present

## 2016-12-31 DIAGNOSIS — S72002A Fracture of unspecified part of neck of left femur, initial encounter for closed fracture: Secondary | ICD-10-CM | POA: Diagnosis not present

## 2016-12-31 DIAGNOSIS — D500 Iron deficiency anemia secondary to blood loss (chronic): Secondary | ICD-10-CM | POA: Diagnosis not present

## 2017-01-06 ENCOUNTER — Other Ambulatory Visit: Payer: Self-pay | Admitting: *Deleted

## 2017-01-06 NOTE — Patient Outreach (Signed)
[facility]) Care Management  01/06/2017  [name] [birth year] [id number]   Met with patient at facility. Patient was finishing up therapy.  Patient reports she now has partial WB status sees the MD at end of month and hopes to get full WB status at that time.  Patient reports she is married and will go home with spouse. He is not able to do much as far as assistance for her and she knows she may need some extra  assistance upon discharge from SNF. Patient reports she thinks her BP had dropped and it caused her to fall. She does have old BP cuff, needs a new one.  Patient does have scale but states it is old and she could benefit from a new one. She does report facility is weighing daily and that she had a 15# weight gain, she is now getting 35m lasix daily. Patient reports she is on oxygen due to lung disease in addition to all of her other chronic health issues.  Patient [place].  Patient thinks she may need transportation services if she continues to need to be in w/c upon discharge.   [facility]. Patient strongly feels she could benefit from the services upon discharge. She will review information and discuss with her spouse.   Plan to follow up with patient and SW at next facility visit. Left THN packet with consent in patient room at facility for patient to review.  MRoyetta Crochet NLaymond Purser RN, BSN, [place]([phone] Business Cell  [phone] Toll Free Office

## 2017-01-08 DIAGNOSIS — J849 Interstitial pulmonary disease, unspecified: Secondary | ICD-10-CM | POA: Diagnosis not present

## 2017-01-08 DIAGNOSIS — S72142D Displaced intertrochanteric fracture of left femur, subsequent encounter for closed fracture with routine healing: Secondary | ICD-10-CM | POA: Diagnosis not present

## 2017-01-08 DIAGNOSIS — I5032 Chronic diastolic (congestive) heart failure: Secondary | ICD-10-CM | POA: Diagnosis not present

## 2017-01-08 DIAGNOSIS — R05 Cough: Secondary | ICD-10-CM | POA: Diagnosis not present

## 2017-01-11 DIAGNOSIS — T814XXD Infection following a procedure, subsequent encounter: Secondary | ICD-10-CM | POA: Diagnosis not present

## 2017-01-11 DIAGNOSIS — I5032 Chronic diastolic (congestive) heart failure: Secondary | ICD-10-CM | POA: Diagnosis not present

## 2017-01-11 DIAGNOSIS — J849 Interstitial pulmonary disease, unspecified: Secondary | ICD-10-CM | POA: Diagnosis not present

## 2017-01-11 DIAGNOSIS — M069 Rheumatoid arthritis, unspecified: Secondary | ICD-10-CM | POA: Diagnosis not present

## 2017-01-13 ENCOUNTER — Other Ambulatory Visit: Payer: Self-pay | Admitting: *Deleted

## 2017-01-13 NOTE — Patient Outreach (Signed)
[facility]) Care Management  01/13/2017  [name] May 16, 1941 [id number]   Met with patient at facility to get update. Patient reports she has had more drainage from her left hip wound, she is to see her ortho tomorrow to follow up on wound and her WB status. She reports she has had some increased pain in her left leg and hip.   Met with SW, Peggy at facility and let her know patient will need [facility] program services upon discharge and has verbally consented to participate in the program.   Plan to continue to monitor and will refer to [facility] care management upon discharge for transition of care program services.  Royetta Crochet. Laymond Purser, RN, BSN, [place] [phone]) Business Cell  [phone]) Toll Free Office

## 2017-01-14 ENCOUNTER — Ambulatory Visit (INDEPENDENT_AMBULATORY_CARE_PROVIDER_SITE_OTHER): Payer: Medicare Other | Admitting: Orthopaedic Surgery

## 2017-01-14 ENCOUNTER — Ambulatory Visit (INDEPENDENT_AMBULATORY_CARE_PROVIDER_SITE_OTHER): Payer: No Typology Code available for payment source

## 2017-01-14 ENCOUNTER — Other Ambulatory Visit (INDEPENDENT_AMBULATORY_CARE_PROVIDER_SITE_OTHER): Payer: Self-pay | Admitting: Orthopaedic Surgery

## 2017-01-14 DIAGNOSIS — S72145G Nondisplaced intertrochanteric fracture of left femur, subsequent encounter for closed fracture with delayed healing: Secondary | ICD-10-CM | POA: Insufficient documentation

## 2017-01-14 DIAGNOSIS — T8149XA Infection following a procedure, other surgical site, initial encounter: Secondary | ICD-10-CM | POA: Insufficient documentation

## 2017-01-14 DIAGNOSIS — IMO0001 Reserved for inherently not codable concepts without codable children: Secondary | ICD-10-CM

## 2017-01-14 DIAGNOSIS — T814XXA Infection following a procedure, initial encounter: Secondary | ICD-10-CM

## 2017-01-14 HISTORY — DX: Nondisplaced intertrochanteric fracture of left femur, subsequent encounter for closed fracture with delayed healing: S72.145G

## 2017-01-14 NOTE — Progress Notes (Signed)
The patient is now about 6 weeks status post open reduction internal fixation of a significantly comminuted and displaced subtrochanteric fracture. She is someone who is also morbidly obese and weighs close to 300 pounds. She is been convalescing a nursing facility and we will been letting her but 50% weight on her hip. The incision to look well for a while in spite of significant adipose tissue but apparently started have some drainage earlier in the week she was started on Keflex by the nursing facility.  On examination wound is slightly indurated with some cellulitis. There is 2 smaller areas of drainage as well. From a pain standpoint that she is doing much better and she does not look ill her sickly. She tolerates and moving hip around better and x-rays actually show interval healing of the fracture.  Given the indurated skin with drainage and cellulitis it is important we proceed to the operating room to wash out the hip due to adipose tissue necrosis and likely infection. I explained this to her in detail and she understands this fully. Risks and benefits were described in detail as well. I do want her to increase her weightbearing to full weightbearing at this point due to the fracture healing. We will likely have her in the hospital for several days on IV antibiotics and we'll determine whether she'll need long-term antibiotics as well.

## 2017-01-15 ENCOUNTER — Encounter (HOSPITAL_COMMUNITY): Payer: Self-pay | Admitting: *Deleted

## 2017-01-15 DIAGNOSIS — M069 Rheumatoid arthritis, unspecified: Secondary | ICD-10-CM | POA: Diagnosis not present

## 2017-01-15 DIAGNOSIS — I5032 Chronic diastolic (congestive) heart failure: Secondary | ICD-10-CM | POA: Diagnosis not present

## 2017-01-15 DIAGNOSIS — T814XXD Infection following a procedure, subsequent encounter: Secondary | ICD-10-CM | POA: Diagnosis not present

## 2017-01-15 DIAGNOSIS — S72142D Displaced intertrochanteric fracture of left femur, subsequent encounter for closed fracture with routine healing: Secondary | ICD-10-CM | POA: Diagnosis not present

## 2017-01-15 NOTE — Progress Notes (Signed)
Called back to [facility] and requested to speak to nurse.  Nurse never came to phone.

## 2017-01-15 NOTE — Progress Notes (Signed)
FAxed preop instructions to [facility] and received  Confirmation.

## 2017-01-15 NOTE — Progress Notes (Signed)
CLEAR LIQUID DIET   Foods Allowed                                                                     Foods Excluded  Coffee and tea, regular and decaf                             liquids that you cannot  Plain Jell-O in any flavor                                             see through such as: Fruit ices (not with fruit pulp)                                     milk, soups, orange juice  Iced Popsicles                                    All solid food Carbonated beverages, regular and diet                                    Cranberry, grape and apple juices Sports drinks like Gatorade Lightly seasoned clear broth or consume(fat free) Sugar, honey syrup  Sample Menu Breakfast                                Lunch                                     Supper Cranberry juice                    Beef broth                            Chicken broth Jell-O                                     Grape juice                           Apple juice Coffee or tea                        Jell-O                                      Popsicle  Coffee or tea                        Coffee or tea  _____________________________________________________________________

## 2017-01-15 NOTE — Progress Notes (Signed)
Preop instructions for:     [name]                     Date of Birth         [birth year]                   Date of Procedure:   01/16/2017     Doctor: dr Zollie Beckers  Time to arrive at [facility]:  1230 pm Report to: Admitting  Procedure: Irrigation and Debridement of Left Hip  Any procedure time changes, MD office will notify you!   Do not eat  past midnight the night before your procedure.(To include any tube feedings-must be discontinued) May have clear liquids from 12 midnite until 0900am morning of surgery then nothing by mouth.      Take these morning medications only with sips of water.(or give through gastrostomy or feeding tube). Use Inhalers as usual and send with patient.  No diabetic medications am of surgery, allopurinol, cymbalta, flonase, metoprolol ( toprol), synthroid, gabapentin, Prpotonix, MS Contin, Ocean nasal spray if needed Note: No Insulin or Diabetic meds should be given or taken the morning of the procedure!   Facility contact:  [facility]                 Phone:   [place]:  Transportation contact phone#:  Please send day of procedure:current med list and meds last taken that day, confirm nothing by mouth status from what time, Patient Demographic info( to include DNR status, problem list, allergies)   RN contact name/phone#:     Gillian Shields RN                         and Fax #: [phone]  Bring Insurance card and picture ID Leave all jewelry and other valuables at place where living( no metal or rings to be worn) No contact lens Women-no make-up, no lotions,perfumes,powders Men-no colognes,lotions  Any questions day of procedure,call  Short Stay  [phone]   Sent from :[facility]                   [place]                   Fax:[phone]  Sent by :Gillian Shields RN

## 2017-01-15 NOTE — Progress Notes (Signed)
Spoke with nurse at [facility] and requested information to include current MAR< face sheet, demographics, current labs, ekg and cxr progressn notes, history.  Nurse to fax.

## 2017-01-15 NOTE — Progress Notes (Signed)
On chart from [facility] the following:  Face Sheet  Current MAR Labs of 12/03/16-CBC/DIFF/BMP/LIVER FCT PANEL/ Medical history  LOV- Dr Delfina Redwood- 12/31/16-on chart

## 2017-01-16 ENCOUNTER — Inpatient Hospital Stay (HOSPITAL_COMMUNITY): Payer: Medicare Other | Admitting: Anesthesiology

## 2017-01-16 ENCOUNTER — Encounter (HOSPITAL_COMMUNITY): Payer: Self-pay | Admitting: Registered Nurse

## 2017-01-16 ENCOUNTER — Encounter (HOSPITAL_COMMUNITY)
Admission: RE | Disposition: A | Discharge status: Skilled Nursing Facility | Payer: Self-pay | Source: Ambulatory Visit | Attending: Orthopaedic Surgery

## 2017-01-16 ENCOUNTER — Inpatient Hospital Stay (HOSPITAL_COMMUNITY)
Admission: RE | Admit: 2017-01-16 | Discharge: 2017-01-21 | DRG: 857 | Disposition: A | Discharge status: Skilled Nursing Facility | Payer: Medicare Other | Source: Ambulatory Visit | Attending: Orthopaedic Surgery | Admitting: Orthopaedic Surgery

## 2017-01-16 DIAGNOSIS — N189 Chronic kidney disease, unspecified: Secondary | ICD-10-CM | POA: Diagnosis not present

## 2017-01-16 DIAGNOSIS — M47816 Spondylosis without myelopathy or radiculopathy, lumbar region: Secondary | ICD-10-CM

## 2017-01-16 DIAGNOSIS — E119 Type 2 diabetes mellitus without complications: Secondary | ICD-10-CM | POA: Diagnosis not present

## 2017-01-16 DIAGNOSIS — M6281 Muscle weakness (generalized): Secondary | ICD-10-CM | POA: Diagnosis not present

## 2017-01-16 DIAGNOSIS — B999 Unspecified infectious disease: Secondary | ICD-10-CM | POA: Diagnosis not present

## 2017-01-16 DIAGNOSIS — Z801 Family history of malignant neoplasm of trachea, bronchus and lung: Secondary | ICD-10-CM

## 2017-01-16 DIAGNOSIS — T814XXD Infection following a procedure, subsequent encounter: Secondary | ICD-10-CM | POA: Diagnosis not present

## 2017-01-16 DIAGNOSIS — Z79899 Other long term (current) drug therapy: Secondary | ICD-10-CM | POA: Diagnosis not present

## 2017-01-16 DIAGNOSIS — IMO0001 Reserved for inherently not codable concepts without codable children: Secondary | ICD-10-CM

## 2017-01-16 DIAGNOSIS — Z8249 Family history of ischemic heart disease and other diseases of the circulatory system: Secondary | ICD-10-CM

## 2017-01-16 DIAGNOSIS — M1712 Unilateral primary osteoarthritis, left knee: Secondary | ICD-10-CM

## 2017-01-16 DIAGNOSIS — I10 Essential (primary) hypertension: Secondary | ICD-10-CM | POA: Diagnosis not present

## 2017-01-16 DIAGNOSIS — Z8051 Family history of malignant neoplasm of kidney: Secondary | ICD-10-CM

## 2017-01-16 DIAGNOSIS — J849 Interstitial pulmonary disease, unspecified: Secondary | ICD-10-CM | POA: Diagnosis not present

## 2017-01-16 DIAGNOSIS — F329 Major depressive disorder, single episode, unspecified: Secondary | ICD-10-CM | POA: Diagnosis not present

## 2017-01-16 DIAGNOSIS — Z841 Family history of disorders of kidney and ureter: Secondary | ICD-10-CM | POA: Diagnosis not present

## 2017-01-16 DIAGNOSIS — I130 Hypertensive heart and chronic kidney disease with heart failure and stage 1 through stage 4 chronic kidney disease, or unspecified chronic kidney disease: Secondary | ICD-10-CM | POA: Diagnosis not present

## 2017-01-16 DIAGNOSIS — J449 Chronic obstructive pulmonary disease, unspecified: Secondary | ICD-10-CM | POA: Diagnosis present

## 2017-01-16 DIAGNOSIS — Z885 Allergy status to narcotic agent status: Secondary | ICD-10-CM | POA: Diagnosis not present

## 2017-01-16 DIAGNOSIS — Z86011 Personal history of benign neoplasm of the brain: Secondary | ICD-10-CM

## 2017-01-16 DIAGNOSIS — I96 Gangrene, not elsewhere classified: Secondary | ICD-10-CM | POA: Diagnosis not present

## 2017-01-16 DIAGNOSIS — Z9889 Other specified postprocedural states: Secondary | ICD-10-CM

## 2017-01-16 DIAGNOSIS — R2689 Other abnormalities of gait and mobility: Secondary | ICD-10-CM | POA: Diagnosis not present

## 2017-01-16 DIAGNOSIS — Z888 Allergy status to other drugs, medicaments and biological substances status: Secondary | ICD-10-CM

## 2017-01-16 DIAGNOSIS — R278 Other lack of coordination: Secondary | ICD-10-CM | POA: Diagnosis not present

## 2017-01-16 DIAGNOSIS — M069 Rheumatoid arthritis, unspecified: Secondary | ICD-10-CM | POA: Diagnosis not present

## 2017-01-16 DIAGNOSIS — E1122 Type 2 diabetes mellitus with diabetic chronic kidney disease: Secondary | ICD-10-CM | POA: Diagnosis not present

## 2017-01-16 DIAGNOSIS — E785 Hyperlipidemia, unspecified: Secondary | ICD-10-CM | POA: Diagnosis not present

## 2017-01-16 DIAGNOSIS — M109 Gout, unspecified: Secondary | ICD-10-CM | POA: Diagnosis not present

## 2017-01-16 DIAGNOSIS — Z833 Family history of diabetes mellitus: Secondary | ICD-10-CM | POA: Diagnosis not present

## 2017-01-16 DIAGNOSIS — Z90710 Acquired absence of both cervix and uterus: Secondary | ICD-10-CM | POA: Diagnosis not present

## 2017-01-16 DIAGNOSIS — Z803 Family history of malignant neoplasm of breast: Secondary | ICD-10-CM | POA: Diagnosis not present

## 2017-01-16 DIAGNOSIS — E1140 Type 2 diabetes mellitus with diabetic neuropathy, unspecified: Secondary | ICD-10-CM | POA: Diagnosis present

## 2017-01-16 DIAGNOSIS — S79911A Unspecified injury of right hip, initial encounter: Secondary | ICD-10-CM | POA: Diagnosis not present

## 2017-01-16 DIAGNOSIS — Z794 Long term (current) use of insulin: Secondary | ICD-10-CM

## 2017-01-16 DIAGNOSIS — Z8049 Family history of malignant neoplasm of other genital organs: Secondary | ICD-10-CM | POA: Diagnosis not present

## 2017-01-16 DIAGNOSIS — T814XXA Infection following a procedure, initial encounter: Secondary | ICD-10-CM | POA: Diagnosis not present

## 2017-01-16 DIAGNOSIS — R41841 Cognitive communication deficit: Secondary | ICD-10-CM | POA: Diagnosis not present

## 2017-01-16 DIAGNOSIS — S72142S Displaced intertrochanteric fracture of left femur, sequela: Secondary | ICD-10-CM | POA: Diagnosis not present

## 2017-01-16 DIAGNOSIS — T8149XA Infection following a procedure, other surgical site, initial encounter: Secondary | ICD-10-CM | POA: Diagnosis present

## 2017-01-16 DIAGNOSIS — M1711 Unilateral primary osteoarthritis, right knee: Secondary | ICD-10-CM

## 2017-01-16 DIAGNOSIS — I5032 Chronic diastolic (congestive) heart failure: Secondary | ICD-10-CM | POA: Diagnosis present

## 2017-01-16 DIAGNOSIS — E039 Hypothyroidism, unspecified: Secondary | ICD-10-CM | POA: Diagnosis not present

## 2017-01-16 DIAGNOSIS — G8911 Acute pain due to trauma: Secondary | ICD-10-CM | POA: Diagnosis not present

## 2017-01-16 DIAGNOSIS — I5033 Acute on chronic diastolic (congestive) heart failure: Secondary | ICD-10-CM | POA: Diagnosis not present

## 2017-01-16 DIAGNOSIS — D500 Iron deficiency anemia secondary to blood loss (chronic): Secondary | ICD-10-CM | POA: Diagnosis not present

## 2017-01-16 HISTORY — DX: Gout, unspecified: M10.9

## 2017-01-16 HISTORY — DX: Spinal stenosis, lumbar region without neurogenic claudication: M48.061

## 2017-01-16 HISTORY — DX: Unspecified lack of coordination: R27.9

## 2017-01-16 HISTORY — DX: Hypothyroidism, unspecified: E03.9

## 2017-01-16 HISTORY — DX: Difficulty in walking, not elsewhere classified: R26.2

## 2017-01-16 HISTORY — PX: INCISION AND DRAINAGE HIP: SHX1801

## 2017-01-16 HISTORY — DX: History of falling: Z91.81

## 2017-01-16 HISTORY — DX: Acute on chronic diastolic (congestive) heart failure: I50.33

## 2017-01-16 HISTORY — DX: Myoneural disorder, unspecified: G70.9

## 2017-01-16 HISTORY — DX: Fracture of unspecified part of neck of left femur, initial encounter for closed fracture: S72.002A

## 2017-01-16 HISTORY — DX: Chronic fatigue, unspecified: R53.82

## 2017-01-16 HISTORY — DX: Muscle weakness (generalized): M62.81

## 2017-01-16 HISTORY — DX: Rheumatoid arthritis, unspecified: M06.9

## 2017-01-16 HISTORY — DX: Anemia, unspecified: D64.9

## 2017-01-16 LAB — BASIC METABOLIC PANEL
ANION GAP: 7 (ref 5–15)
BUN: 14 mg/dL (ref 6–20)
CHLORIDE: 100 mmol/L — AB (ref 101–111)
CO2: 32 mmol/L (ref 22–32)
CREATININE: 1.1 mg/dL — AB (ref 0.44–1.00)
Calcium: 8.1 mg/dL — ABNORMAL LOW (ref 8.9–10.3)
GFR calc non Af Amer: 48 mL/min — ABNORMAL LOW (ref >60)
GFR, EST AFRICAN AMERICAN: 55 mL/min — AB (ref >60)
Glucose, Bld: 169 mg/dL — ABNORMAL HIGH (ref 65–99)
Potassium: 4 mmol/L (ref 3.5–5.1)
Sodium: 139 mmol/L (ref 135–145)

## 2017-01-16 LAB — GLUCOSE, CAPILLARY
GLUCOSE-CAPILLARY: 73 mg/dL (ref 65–99)
GLUCOSE-CAPILLARY: 134 mg/dL — AB (ref 65–99)
GLUCOSE-CAPILLARY: 204 mg/dL — AB (ref 65–99)

## 2017-01-16 LAB — POCT I-STAT 4, (NA,K, GLUC, HGB,HCT)
Glucose, Bld: 79 mg/dL (ref 65–99)
HEMATOCRIT: 33 % — AB (ref 36.0–46.0)
Hemoglobin: 11.2 g/dL — ABNORMAL LOW (ref 12.0–15.0)
POTASSIUM: 3.8 mmol/L (ref 3.5–5.1)
Sodium: 144 mmol/L (ref 135–145)

## 2017-01-16 SURGERY — IRRIGATION AND DEBRIDEMENT HIP
Anesthesia: General | Laterality: Left

## 2017-01-16 MED ORDER — PROPOFOL 10 MG/ML IV BOLUS
INTRAVENOUS | Status: DC | PRN
  Administered 2017-01-16: 170 mg via INTRAVENOUS

## 2017-01-16 MED ORDER — ALLOPURINOL 300 MG PO TABS
300.0000 mg | ORAL_TABLET | Freq: Every day | ORAL | Status: DC
  Administered 2017-01-17 – 2017-01-21 (×5): 300 mg via ORAL
  Filled 2017-01-16 (×5): qty 1

## 2017-01-16 MED ORDER — FUROSEMIDE 40 MG PO TABS
80.0000 mg | ORAL_TABLET | Freq: Every day | ORAL | Status: DC
  Administered 2017-01-17 – 2017-01-21 (×5): 80 mg via ORAL
  Filled 2017-01-16 (×5): qty 2

## 2017-01-16 MED ORDER — ADULT MULTIVITAMIN W/MINERALS CH
1.0000 | ORAL_TABLET | Freq: Every day | ORAL | Status: DC
  Administered 2017-01-17 – 2017-01-21 (×5): 1 via ORAL
  Filled 2017-01-16 (×5): qty 1

## 2017-01-16 MED ORDER — GUAIFENESIN ER 600 MG PO TB12
1200.0000 mg | ORAL_TABLET | Freq: Two times a day (BID) | ORAL | Status: DC
  Administered 2017-01-16 – 2017-01-21 (×10): 1200 mg via ORAL
  Filled 2017-01-16 (×10): qty 2

## 2017-01-16 MED ORDER — ONDANSETRON HCL 4 MG/2ML IJ SOLN
INTRAMUSCULAR | Status: DC | PRN
  Administered 2017-01-16: 4 mg via INTRAVENOUS

## 2017-01-16 MED ORDER — ONDANSETRON HCL 4 MG/2ML IJ SOLN: 4.0000 mg | Freq: Four times a day (QID) | INTRAMUSCULAR | Status: DC | PRN

## 2017-01-16 MED ORDER — DULOXETINE HCL 60 MG PO CPEP
60.0000 mg | ORAL_CAPSULE | Freq: Every day | ORAL | Status: DC
  Administered 2017-01-17 – 2017-01-21 (×5): 60 mg via ORAL
  Filled 2017-01-16 (×5): qty 1

## 2017-01-16 MED ORDER — LACTATED RINGERS IV SOLN
INTRAVENOUS | Status: DC
  Administered 2017-01-16 (×2): via INTRAVENOUS

## 2017-01-16 MED ORDER — PANTOPRAZOLE SODIUM 40 MG PO TBEC
40.0000 mg | DELAYED_RELEASE_TABLET | Freq: Every day | ORAL | Status: DC
  Administered 2017-01-17 – 2017-01-21 (×5): 40 mg via ORAL
  Filled 2017-01-16 (×5): qty 1

## 2017-01-16 MED ORDER — ROCURONIUM BROMIDE 10 MG/ML (PF) SYRINGE
PREFILLED_SYRINGE | INTRAVENOUS | Status: DC | PRN
  Administered 2017-01-16: 30 mg via INTRAVENOUS

## 2017-01-16 MED ORDER — SUGAMMADEX SODIUM 500 MG/5ML IV SOLN
INTRAVENOUS | Status: AC
  Filled 2017-01-16: qty 5

## 2017-01-16 MED ORDER — MORPHINE SULFATE 15 MG PO TABS
15.0000 mg | ORAL_TABLET | Freq: Three times a day (TID) | ORAL | Status: DC | PRN
  Administered 2017-01-16 – 2017-01-17 (×2): 15 mg via ORAL
  Filled 2017-01-16 (×2): qty 1

## 2017-01-16 MED ORDER — POTASSIUM CHLORIDE CRYS ER 20 MEQ PO TBCR
20.0000 meq | EXTENDED_RELEASE_TABLET | Freq: Two times a day (BID) | ORAL | Status: DC
  Administered 2017-01-17 – 2017-01-21 (×9): 20 meq via ORAL
  Filled 2017-01-16 (×9): qty 1

## 2017-01-16 MED ORDER — ALBUTEROL SULFATE HFA 108 (90 BASE) MCG/ACT IN AERS: 2.0000 | INHALATION_SPRAY | Freq: Four times a day (QID) | RESPIRATORY_TRACT | Status: DC | PRN

## 2017-01-16 MED ORDER — LEVOTHYROXINE SODIUM 50 MCG PO TABS
50.0000 ug | ORAL_TABLET | Freq: Every day | ORAL | Status: DC
  Administered 2017-01-17 – 2017-01-21 (×5): 50 ug via ORAL
  Filled 2017-01-16 (×5): qty 1

## 2017-01-16 MED ORDER — HYDROXYCHLOROQUINE SULFATE 200 MG PO TABS
200.0000 mg | ORAL_TABLET | Freq: Every day | ORAL | Status: DC
  Administered 2017-01-16 – 2017-01-21 (×6): 200 mg via ORAL
  Filled 2017-01-16 (×6): qty 1

## 2017-01-16 MED ORDER — SODIUM CHLORIDE 0.9 % IV SOLN
INTRAVENOUS | Status: DC
  Administered 2017-01-16 – 2017-01-19 (×2): via INTRAVENOUS

## 2017-01-16 MED ORDER — HYDROMORPHONE HCL-NACL 0.5-0.9 MG/ML-% IV SOSY
1.0000 mg | PREFILLED_SYRINGE | INTRAVENOUS | Status: DC | PRN
  Administered 2017-01-16: 1 mg via INTRAVENOUS
  Filled 2017-01-16: qty 2

## 2017-01-16 MED ORDER — METOPROLOL TARTRATE 5 MG/5ML IV SOLN
INTRAVENOUS | Status: AC
  Filled 2017-01-16: qty 5

## 2017-01-16 MED ORDER — VANCOMYCIN HCL 10 G IV SOLR
2500.0000 mg | Freq: Once | INTRAVENOUS | Status: AC
  Administered 2017-01-16: 2500 mg via INTRAVENOUS
  Filled 2017-01-16: qty 2000

## 2017-01-16 MED ORDER — PROMETHAZINE HCL 25 MG/ML IJ SOLN
6.2500 mg | INTRAMUSCULAR | Status: DC | PRN
Start: 2017-01-16 — End: 2017-01-16

## 2017-01-16 MED ORDER — ATORVASTATIN CALCIUM 10 MG PO TABS
10.0000 mg | ORAL_TABLET | Freq: Every evening | ORAL | Status: DC
  Administered 2017-01-17 – 2017-01-20 (×4): 10 mg via ORAL
  Filled 2017-01-16 (×4): qty 1

## 2017-01-16 MED ORDER — SALINE SPRAY 0.65 % NA SOLN
1.0000 | NASAL | Status: DC | PRN
  Filled 2017-01-16: qty 44

## 2017-01-16 MED ORDER — VITAMIN D3 25 MCG (1000 UNIT) PO TABS
2000.0000 [IU] | ORAL_TABLET | Freq: Every day | ORAL | Status: DC
  Administered 2017-01-17 – 2017-01-20 (×4): 2000 [IU] via ORAL
  Filled 2017-01-16 (×4): qty 2

## 2017-01-16 MED ORDER — SUGAMMADEX SODIUM 200 MG/2ML IV SOLN
INTRAVENOUS | Status: DC | PRN
  Administered 2017-01-16: 250 mg via INTRAVENOUS

## 2017-01-16 MED ORDER — PREDNISONE 5 MG PO TABS
10.0000 mg | ORAL_TABLET | Freq: Every day | ORAL | Status: DC
  Administered 2017-01-17 – 2017-01-21 (×5): 10 mg via ORAL
  Filled 2017-01-16 (×5): qty 2

## 2017-01-16 MED ORDER — METOPROLOL SUCCINATE ER 50 MG PO TB24
50.0000 mg | ORAL_TABLET | Freq: Every morning | ORAL | Status: DC
  Administered 2017-01-17 – 2017-01-21 (×5): 50 mg via ORAL
  Filled 2017-01-16 (×5): qty 1

## 2017-01-16 MED ORDER — SUCCINYLCHOLINE CHLORIDE 200 MG/10ML IV SOSY
PREFILLED_SYRINGE | INTRAVENOUS | Status: DC | PRN
  Administered 2017-01-16: 150 mg via INTRAVENOUS

## 2017-01-16 MED ORDER — ONDANSETRON HCL 4 MG/2ML IJ SOLN
INTRAMUSCULAR | Status: AC
  Filled 2017-01-16: qty 2

## 2017-01-16 MED ORDER — DEXAMETHASONE SODIUM PHOSPHATE 10 MG/ML IJ SOLN
INTRAMUSCULAR | Status: DC | PRN
  Administered 2017-01-16: 10 mg via INTRAVENOUS

## 2017-01-16 MED ORDER — FENTANYL CITRATE (PF) 100 MCG/2ML IJ SOLN
INTRAMUSCULAR | Status: AC
  Filled 2017-01-16: qty 2

## 2017-01-16 MED ORDER — VANCOMYCIN HCL 10 G IV SOLR
1500.0000 mg | INTRAVENOUS | Status: DC
  Administered 2017-01-17 – 2017-01-20 (×4): 1500 mg via INTRAVENOUS
  Filled 2017-01-16 (×4): qty 1500

## 2017-01-16 MED ORDER — DOCUSATE SODIUM 100 MG PO CAPS
100.0000 mg | ORAL_CAPSULE | Freq: Two times a day (BID) | ORAL | Status: DC
  Administered 2017-01-17 – 2017-01-21 (×9): 100 mg via ORAL
  Filled 2017-01-16 (×9): qty 1

## 2017-01-16 MED ORDER — POLYETHYLENE GLYCOL 3350 17 G PO PACK
17.0000 g | PACK | Freq: Every day | ORAL | Status: DC
  Administered 2017-01-17 – 2017-01-19 (×3): 17 g via ORAL
  Filled 2017-01-16 (×4): qty 1

## 2017-01-16 MED ORDER — LIDOCAINE 2% (20 MG/ML) 5 ML SYRINGE
INTRAMUSCULAR | Status: AC
  Filled 2017-01-16: qty 5

## 2017-01-16 MED ORDER — ROCURONIUM BROMIDE 50 MG/5ML IV SOSY
PREFILLED_SYRINGE | INTRAVENOUS | Status: AC
  Filled 2017-01-16: qty 5

## 2017-01-16 MED ORDER — DIPHENHYDRAMINE HCL 12.5 MG/5ML PO ELIX: 12.5000 mg | ORAL_SOLUTION | ORAL | Status: DC | PRN

## 2017-01-16 MED ORDER — MORPHINE SULFATE ER 15 MG PO TBCR
15.0000 mg | EXTENDED_RELEASE_TABLET | Freq: Two times a day (BID) | ORAL | Status: DC
  Administered 2017-01-17 – 2017-01-21 (×9): 15 mg via ORAL
  Filled 2017-01-16 (×9): qty 1

## 2017-01-16 MED ORDER — FENTANYL CITRATE (PF) 250 MCG/5ML IJ SOLN
INTRAMUSCULAR | Status: DC | PRN
  Administered 2017-01-16 (×4): 50 ug via INTRAVENOUS

## 2017-01-16 MED ORDER — ACETAMINOPHEN 650 MG RE SUPP: 650.0000 mg | Freq: Four times a day (QID) | RECTAL | Status: DC | PRN

## 2017-01-16 MED ORDER — DEXTROSE 5 % IV SOLN
3.0000 g | Freq: Once | INTRAVENOUS | Status: AC
  Administered 2017-01-16: 3 g via INTRAVENOUS
  Filled 2017-01-16: qty 3

## 2017-01-16 MED ORDER — LIDOCAINE 2% (20 MG/ML) 5 ML SYRINGE
INTRAMUSCULAR | Status: DC | PRN
  Administered 2017-01-16: 60 mg via INTRAVENOUS

## 2017-01-16 MED ORDER — IRBESARTAN 150 MG PO TABS
150.0000 mg | ORAL_TABLET | Freq: Every day | ORAL | Status: DC
  Administered 2017-01-18 – 2017-01-21 (×4): 150 mg via ORAL
  Filled 2017-01-16 (×5): qty 1

## 2017-01-16 MED ORDER — SUCCINYLCHOLINE CHLORIDE 200 MG/10ML IV SOSY
PREFILLED_SYRINGE | INTRAVENOUS | Status: AC
  Filled 2017-01-16: qty 10

## 2017-01-16 MED ORDER — METOPROLOL TARTRATE 5 MG/5ML IV SOLN
INTRAVENOUS | Status: DC | PRN
  Administered 2017-01-16 (×3): 1 mg via INTRAVENOUS

## 2017-01-16 MED ORDER — DEXAMETHASONE SODIUM PHOSPHATE 10 MG/ML IJ SOLN
INTRAMUSCULAR | Status: AC
  Filled 2017-01-16: qty 1

## 2017-01-16 MED ORDER — HYDROMORPHONE HCL-NACL 0.5-0.9 MG/ML-% IV SOSY
PREFILLED_SYRINGE | INTRAVENOUS | Status: AC
  Filled 2017-01-16: qty 1

## 2017-01-16 MED ORDER — POLYSACCHARIDE IRON COMPLEX 150 MG PO CAPS
150.0000 mg | ORAL_CAPSULE | Freq: Every day | ORAL | Status: DC
  Administered 2017-01-17 – 2017-01-21 (×5): 150 mg via ORAL
  Filled 2017-01-16 (×5): qty 1

## 2017-01-16 MED ORDER — ACETAMINOPHEN 325 MG PO TABS
650.0000 mg | ORAL_TABLET | Freq: Four times a day (QID) | ORAL | Status: DC | PRN
  Administered 2017-01-17 – 2017-01-18 (×2): 650 mg via ORAL
  Filled 2017-01-16 (×2): qty 2

## 2017-01-16 MED ORDER — PIPERACILLIN-TAZOBACTAM 3.375 G IVPB
3.3750 g | Freq: Three times a day (TID) | INTRAVENOUS | Status: DC
  Administered 2017-01-16 – 2017-01-21 (×14): 3.375 g via INTRAVENOUS
  Filled 2017-01-16 (×15): qty 50

## 2017-01-16 MED ORDER — AZITHROMYCIN 250 MG PO TABS
500.0000 mg | ORAL_TABLET | ORAL | Status: DC
  Administered 2017-01-16 – 2017-01-21 (×3): 500 mg via ORAL
  Filled 2017-01-16 (×3): qty 2

## 2017-01-16 MED ORDER — HYDROMORPHONE HCL-NACL 0.5-0.9 MG/ML-% IV SOSY
0.2500 mg | PREFILLED_SYRINGE | INTRAVENOUS | Status: DC | PRN
  Administered 2017-01-16 (×4): 0.5 mg via INTRAVENOUS

## 2017-01-16 MED ORDER — IPRATROPIUM-ALBUTEROL 0.5-2.5 (3) MG/3ML IN SOLN: 3.0000 mL | Freq: Four times a day (QID) | RESPIRATORY_TRACT | Status: DC | PRN

## 2017-01-16 MED ORDER — ONDANSETRON HCL 4 MG PO TABS: 4.0000 mg | ORAL_TABLET | Freq: Four times a day (QID) | ORAL | Status: DC | PRN

## 2017-01-16 MED ORDER — ASPIRIN 325 MG PO TABS
325.0000 mg | ORAL_TABLET | Freq: Two times a day (BID) | ORAL | Status: DC
  Administered 2017-01-16 – 2017-01-21 (×10): 325 mg via ORAL
  Filled 2017-01-16 (×10): qty 1

## 2017-01-16 MED ORDER — METHOCARBAMOL 500 MG PO TABS
500.0000 mg | ORAL_TABLET | Freq: Four times a day (QID) | ORAL | Status: DC | PRN
  Administered 2017-01-17 – 2017-01-21 (×8): 500 mg via ORAL
  Filled 2017-01-16 (×8): qty 1

## 2017-01-16 MED ORDER — METOCLOPRAMIDE HCL 5 MG/ML IJ SOLN: 5.0000 mg | Freq: Three times a day (TID) | INTRAMUSCULAR | Status: DC | PRN

## 2017-01-16 MED ORDER — LIRAGLUTIDE 18 MG/3ML ~~LOC~~ SOPN: 1.8000 mg | PEN_INJECTOR | Freq: Every day | SUBCUTANEOUS | Status: DC

## 2017-01-16 MED ORDER — PROPOFOL 10 MG/ML IV BOLUS
INTRAVENOUS | Status: AC
  Filled 2017-01-16: qty 20

## 2017-01-16 MED ORDER — CYCLOBENZAPRINE HCL 5 MG PO TABS: 5.0000 mg | ORAL_TABLET | Freq: Every evening | ORAL | Status: DC | PRN

## 2017-01-16 MED ORDER — FENTANYL CITRATE (PF) 100 MCG/2ML IJ SOLN
INTRAMUSCULAR | Status: AC
Start: 2017-01-16 — End: 2017-01-16
  Filled 2017-01-16: qty 2

## 2017-01-16 MED ORDER — METHOCARBAMOL 1000 MG/10ML IJ SOLN
500.0000 mg | Freq: Four times a day (QID) | INTRAVENOUS | Status: DC | PRN
  Administered 2017-01-16: 500 mg via INTRAVENOUS
  Filled 2017-01-16: qty 550

## 2017-01-16 MED ORDER — METOCLOPRAMIDE HCL 5 MG PO TABS: 5.0000 mg | ORAL_TABLET | Freq: Three times a day (TID) | ORAL | Status: DC | PRN

## 2017-01-16 MED ORDER — GABAPENTIN 300 MG PO CAPS
1200.0000 mg | ORAL_CAPSULE | Freq: Three times a day (TID) | ORAL | Status: DC
  Administered 2017-01-16: 1200 mg via ORAL
  Filled 2017-01-16 (×2): qty 4

## 2017-01-16 MED ORDER — FLUTICASONE PROPIONATE 50 MCG/ACT NA SUSP
2.0000 | Freq: Every day | NASAL | Status: DC
  Administered 2017-01-17 – 2017-01-21 (×5): 2 via NASAL
  Filled 2017-01-16: qty 16

## 2017-01-16 MED ORDER — ALBUTEROL SULFATE (2.5 MG/3ML) 0.083% IN NEBU: 2.5000 mg | INHALATION_SOLUTION | Freq: Four times a day (QID) | RESPIRATORY_TRACT | Status: DC | PRN

## 2017-01-16 MED ORDER — INSULIN ASPART 100 UNIT/ML ~~LOC~~ SOLN
0.0000 [IU] | Freq: Three times a day (TID) | SUBCUTANEOUS | Status: DC
  Administered 2017-01-17: 1 [IU] via SUBCUTANEOUS
  Administered 2017-01-17: 2 [IU] via SUBCUTANEOUS
  Administered 2017-01-18 – 2017-01-19 (×2): 1 [IU] via SUBCUTANEOUS
  Administered 2017-01-19: 2 [IU] via SUBCUTANEOUS
  Administered 2017-01-20: 1 [IU] via SUBCUTANEOUS
  Administered 2017-01-20: 3 [IU] via SUBCUTANEOUS

## 2017-01-16 SURGICAL SUPPLY — 39 items
BAG ZIPLOCK 12X15 (MISCELLANEOUS) ×2 IMPLANT
COVER PERINEAL POST (MISCELLANEOUS) IMPLANT
COVER SURGICAL LIGHT HANDLE (MISCELLANEOUS) ×2 IMPLANT
DRAPE C-ARM 42X120 X-RAY (DRAPES) IMPLANT
DRAPE C-ARMOR (DRAPES) IMPLANT
DRAPE INCISE IOBAN 66X45 STRL (DRAPES) ×2 IMPLANT
DRAPE ORTHO SPLIT 77X108 STRL (DRAPES) ×2
DRAPE SURG ORHT 6 SPLT 77X108 (DRAPES) ×2 IMPLANT
DRAPE U-SHAPE 47X51 STRL (DRAPES) ×2 IMPLANT
DRSG MEPILEX BORDER 4X4 (GAUZE/BANDAGES/DRESSINGS) IMPLANT
DRSG VAC ATS SM SENSATRAC (GAUZE/BANDAGES/DRESSINGS) ×2 IMPLANT
DURAPREP 26ML APPLICATOR (WOUND CARE) ×2 IMPLANT
ELECT REM PT RETURN 15FT ADLT (MISCELLANEOUS) ×2 IMPLANT
EVACUATOR 1/8 PVC DRAIN (DRAIN) ×2 IMPLANT
GAUZE XEROFORM 1X8 LF (GAUZE/BANDAGES/DRESSINGS) ×2 IMPLANT
GLOVE BIO SURGEON STRL SZ7.5 (GLOVE) ×4 IMPLANT
GLOVE BIOGEL PI IND STRL 8 (GLOVE) ×2 IMPLANT
GLOVE BIOGEL PI INDICATOR 8 (GLOVE) ×2
GLOVE ECLIPSE 8.0 STRL XLNG CF (GLOVE) ×4 IMPLANT
GOWN STRL REUS W/TWL XL LVL3 (GOWN DISPOSABLE) ×4 IMPLANT
HANDPIECE INTERPULSE COAX TIP (DISPOSABLE) ×1
IV NS IRRIG 3000ML ARTHROMATIC (IV SOLUTION) ×4 IMPLANT
KIT BASIN OR (CUSTOM PROCEDURE TRAY) ×2 IMPLANT
NS IRRIG 1000ML POUR BTL (IV SOLUTION) ×2 IMPLANT
PACK GENERAL/GYN (CUSTOM PROCEDURE TRAY) ×2 IMPLANT
POSITIONER SURGICAL ARM (MISCELLANEOUS) IMPLANT
SET HNDPC FAN SPRY TIP SCT (DISPOSABLE) ×1 IMPLANT
SOL PREP POV-IOD 4OZ 10% (MISCELLANEOUS) IMPLANT
SOL PREP PROV IODINE SCRUB 4OZ (MISCELLANEOUS) IMPLANT
STAPLER VISISTAT 35W (STAPLE) ×2 IMPLANT
SUT ETHILON 2 0 PS N (SUTURE) ×10 IMPLANT
SUT ETHILON 3 0 PS 1 (SUTURE) IMPLANT
SUT VIC AB 0 CT1 27 (SUTURE) ×2
SUT VIC AB 0 CT1 27XBRD ANTBC (SUTURE) ×2 IMPLANT
SWAB COLLECTION DEVICE MRSA (MISCELLANEOUS) ×2 IMPLANT
SWAB CULTURE ESWAB REG 1ML (MISCELLANEOUS) ×2 IMPLANT
TOWEL OR 17X26 10 PK STRL BLUE (TOWEL DISPOSABLE) ×2 IMPLANT
WATER STERILE IRR 1500ML POUR (IV SOLUTION) ×2 IMPLANT
YANKAUER SUCT BULB TIP 10FT TU (MISCELLANEOUS) ×2 IMPLANT

## 2017-01-16 NOTE — Brief Op Note (Signed)
01/16/2017  3:46 PM  PATIENT:  [name]  76 y.o. female  PRE-OPERATIVE DIAGNOSIS:  left hip wound infection  POST-OPERATIVE DIAGNOSIS:  left hip wound infection  PROCEDURE:  Procedure(s): IRRIGATION AND DEBRIDEMENT LEFT HIP (Left)  SURGEON:  Surgeon(s) and Role:    [name], MD - Primary  PHYSICIAN ASSISTANT: Benita Stabile, PA-C  ANESTHESIA:   general  EBL:  Total I/O In: 1000 [I.V.:1000] Out: 350 [Blood:350]  COUNTS:  YES  DICTATION: .Other Dictation: Dictation Number [id number]  PLAN OF CARE: Admit to inpatient   PATIENT DISPOSITION:  PACU - hemodynamically stable.   Delay start of Pharmacological VTE agent (>24hrs) due to surgical blood loss or risk of bleeding: no

## 2017-01-16 NOTE — Transfer of Care (Signed)
Immediate Anesthesia Transfer of Care Note  Patient: [name]  Procedure(s) Performed: Procedure(s): IRRIGATION AND DEBRIDEMENT LEFT HIP (Left)  Patient Location: PACU  Anesthesia Type:General  Level of Consciousness: awake, alert , oriented and patient cooperative  Airway & Oxygen Therapy: Patient Spontanous Breathing and Patient connected to face mask oxygen  Post-op Assessment: Report given to RN and Post -op Vital signs reviewed and stable  Post vital signs: stable  Last Vitals:  Vitals:   01/16/17 1553 01/16/17 1600  BP: 118/83 122/77  Pulse: (!) 107 (!) 112  Resp: 13 15  Temp:    SpO2: 96% 100%    Last Pain:  Vitals:   01/16/17 1404  TempSrc:   PainSc: 6          Complications: No apparent anesthesia complications

## 2017-01-16 NOTE — H&P (Signed)
[name] is an 76 y.o. female.   Chief Complaint:   Left hip wound drainage HPI:   76 yo morbidly obese female diabetic who is 6 week post ORIF of a comminuted complex left hip fracture.  She has been recovering in a skilled nursing facility.  Earlier this week she started to experience drainage from her incision.  On exam, there is redness around the soft tissue with purulent drainage.  Past Medical History:  Diagnosis Date  . Acute on chronic diastolic (congestive) heart failure ([place])   . Anemia    iron deficiency anemia - secondary to blood loss ( chronic)   . Arthritis    endstage changes bilateral knees/bilateral ankles.   . Asthma   . Carotid artery occlusion   . Chronic fatigue   . Chronic kidney disease   . Closed left hip fracture ([place])   . Clotting disorder ([place])    pt denies this  . Contusion of left knee    due to fall 1/14.  [name] COPD (chronic obstructive pulmonary disease) (HCC)    pulmonary fibrosis  . Depression, reactive   . Diabetes mellitus    type II   . Diastolic dysfunction   . Difficulty in walking   . Family history of heart disease   . Generalized muscle weakness   . Gout   . High cholesterol   . History of falling   . Hypertension   . Hypothyroidism   . Interstitial lung disease ([place])   . 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    . Morbid obesity ([place])   . Morbid obesity ([place])   . Neuromuscular disorder ([place])    diabetic neuropathy   . Normal coronary arteries    cardiac catheterization performed  10/31/14  . RA (rheumatoid arthritis) ([employer])    has been off methotreaxte since 10/13.  [name] Rheumatoid arthritis ([place])   . Shortness of breath   . Spinal stenosis of lumbar region   . Thyroid disease   . Unspecified lack of coordination   . URI (upper respiratory infection)     Past Surgical History:  Procedure Laterality Date  . ABDOMINAL HYSTERECTOMY     . BRAIN SURGERY     Gamma knife 10/13. Needs repeat spring  '14  . CARDIAC CATHETERIZATION N/A 10/31/2014   Procedure: Right/Left Heart Cath and Coronary Angiography;  Surgeon: [name], MD;  Location: [place] [facility];  Service: Cardiovascular;  Laterality: N/A;  . ESOPHAGOGASTRODUODENOSCOPY (EGD) WITH PROPOFOL N/A 09/14/2014   Procedure: ESOPHAGOGASTRODUODENOSCOPY (EGD) WITH PROPOFOL;  Surgeon: [name], MD;  Location: [facility];  Service: Endoscopy;  Laterality: N/A;  . INTRAMEDULLARY (IM) NAIL INTERTROCHANTERIC Left 11/29/2016   Procedure: INTRAMEDULLARY (IM) NAIL INTERTROCHANTRIC;  Surgeon: [name], MD;  Location: [place];  Service: Orthopedics;  Laterality: Left;  . OVARY SURGERY    . SHOULDER SURGERY Left   . TONSILLECTOMY  age 17  . VIDEO BRONCHOSCOPY Bilateral 05/31/2013   Procedure: VIDEO BRONCHOSCOPY WITHOUT FLUORO;  Surgeon: Brand Males, MD;  Location: [place];  Service: Cardiopulmonary;  Laterality: Bilateral;  . video bronscoscopy  2000   lung    Family History  Problem Relation Age of Onset  . Diabetes Mother   . Heart attack Mother   . Hypertension Father   . Lung cancer Father   . Diabetes Sister   . Diabetes Brother   .  Hypertension Brother   . Heart disease Brother   . Heart attack Brother   . Kidney cancer Brother   . Uterine cancer Daughter   . Breast cancer Sister   . Rheum arthritis Maternal Uncle   . Gout Brother   . Kidney failure Brother        x 5   Social History:  reports that she has never smoked. She has never used smokeless tobacco. She reports that she does not drink alcohol or use drugs.  Allergies:  Allergies  Allergen Reactions  . Codeine Swelling    Facial swelling  . Remicade [Infliximab] Anaphylaxis    "sent me into shock"  . Zestril [Lisinopril] Swelling    Face and neck swelling    Medications Prior to Admission  Medication Sig Dispense Refill  . cephALEXin (KEFLEX) 500 MG  capsule Take 500 mg by mouth 3 (three) times daily.    [name] Dextromethorphan-Guaifenesin (DELSYM COUGH/CHEST CONGEST DM PO) Take by mouth. Take 10 mls by mouth every twelve hours as needed for cough    . ipratropium-albuterol (DUONEB) 0.5-2.5 (3) MG/3ML SOLN Take 3 mLs by nebulization. Every 6 hours as needed for cough or wheezing    . iron polysaccharides (NIFEREX) 150 MG capsule Take 150 mg by mouth daily.    . potassium chloride (K-DUR,KLOR-CON) 10 MEQ tablet Take 20 mEq by mouth 2 (two) times daily.    [name] acetaminophen (TYLENOL) 500 MG tablet Take 1,000 mg by mouth every 6 (six) hours as needed for moderate pain.     [name] albuterol (PROVENTIL HFA;VENTOLIN HFA) 108 (90 BASE) MCG/ACT inhaler Inhale 2 puffs into the lungs every 6 (six) hours as needed for wheezing or shortness of breath.    . allopurinol (ZYLOPRIM) 300 MG tablet Take 300 mg by mouth daily.     [name] aspirin 325 MG tablet Take 1 tablet (325 mg total) by mouth 2 (two) times daily after a meal. 60 tablet 0  . atorvastatin (LIPITOR) 20 MG tablet Take 10 mg by mouth every evening.     [name] azithromycin (ZITHROMAX) 500 MG tablet Take 1 tablet by mouth daily. TAKE ON MON, WED & FRI  0  . Calcium Carb-Cholecalciferol (CALCIUM + D3 PO) Take 2 tablets by mouth daily with lunch.     . cholecalciferol (VITAMIN D) 1000 UNITS tablet Take 2,000 Units by mouth daily with lunch.     . cyclobenzaprine (FLEXERIL) 5 MG tablet Take 1 tablet (5 mg total) by mouth at bedtime as needed for muscle spasms. 30 tablet 1  . docusate sodium (COLACE) 100 MG capsule Take 1 capsule (100 mg total) by mouth 2 (two) times daily. 10 capsule 0  . DULoxetine (CYMBALTA) 60 MG capsule Take 1 capsule (60 mg total) by mouth daily. 30 capsule 4  . fluticasone (FLONASE) 50 MCG/ACT nasal spray Place 2 sprays into both nostrils daily. 16 g 3  . furosemide (LASIX) 20 MG tablet Take 80 mg by mouth daily.   1  . gabapentin (NEURONTIN) 600 MG tablet Take two in the morning , Two mid- afternoon  and two at bedtime (Patient taking differently: Take 1,200 mg by mouth 3 (three) times daily. ) 540 tablet 0  . guaiFENesin (MUCINEX) 600 MG 12 hr tablet Take 1,200 mg by mouth 2 (two) times daily.    . hydroxychloroquine (PLAQUENIL) 200 MG tablet Take 200 mg by mouth daily.    . insulin aspart (NOVOLOG) 100 UNIT/ML injection Inject 0-9 Units into the skin  3 (three) times daily with meals. Sliding scale insulin Less than 70 initiate hypoglycemia protocol 70-120  0 units 120-150 1 unit 151-200 2 units 201-250 3 units 251-300 5 units 301-350 7 units 351-400 9 units  Greater than 400 call MD 10 mL 11  . levothyroxine (SYNTHROID, LEVOTHROID) 50 MCG tablet Take 1 tablet (50 mcg total) by mouth daily. 30 tablet 1  . metoprolol succinate (TOPROL-XL) 50 MG 24 hr tablet Take 1 tablet (50 mg total) by mouth every morning. Take with or immediately following a meal.    . morphine (MS CONTIN) 15 MG 12 hr tablet Take 1 tablet (15 mg total) by mouth every 12 (twelve) hours. 60 tablet 0  . morphine (MSIR) 15 MG tablet Take 1 tablet (15 mg total) by mouth every 8 (eight) hours as needed for severe pain. 30 tablet 0  . Multiple Vitamin (MULTIVITAMIN WITH MINERALS) TABS Take 1 tablet by mouth daily.     [name] olmesartan (BENICAR) 20 MG tablet Take 1 tablet (20 mg total) by mouth daily.    . OXYGEN-HELIUM IN Inhale 2 L into the lungs at bedtime. And on exertion    . pantoprazole (PROTONIX) 40 MG tablet take 1 tablet by mouth once daily MINUTES BEFORE 1ST MEAL OF THE DAY 30 tablet 3  . polyethylene glycol (MIRALAX / GLYCOLAX) packet Take 17 g by mouth daily. 14 each 0  . predniSONE (DELTASONE) 10 MG tablet Take 10 mg by mouth every morning.   0  . sodium chloride (OCEAN) 0.65 % SOLN nasal spray Place 1 spray into both nostrils as needed for congestion.    [name] Spacer/Aero-Holding Chambers (AEROCHAMBER MV) inhaler Use as instructed 1 each 0  . VICTOZA 18 MG/3ML SOPN Inject 1.8 mg as directed daily.   0    No results  found for this or any previous visit (from the past 48 hour(s)). Xr Hip Unilat W Or W/o Pelvis 1v Left  Result Date: 01/14/2017 X-rays of her pelvis and left hip show interval healing from an intertrochanteric/subtrochanteric proximal left femur fracture or the hardware is intact.   Review of Systems  All other systems reviewed and are negative.   There were no vitals taken for this visit. Physical Exam  Constitutional: She is oriented to person, place, and time. She appears well-developed and well-nourished.  HENT:  Head: Normocephalic and atraumatic.  Eyes: Pupils are equal, round, and reactive to light. EOM are normal.  Neck: Normal range of motion. Neck supple.  Cardiovascular: Normal rate.   Respiratory: Effort normal and breath sounds normal.  GI: Soft. Bowel sounds are normal.  Musculoskeletal:       Legs: Neurological: She is alert and oriented to person, place, and time.  Skin: Skin is warm and dry.  Psychiatric: She has a normal mood and affect.     Assessment/Plan Left hip wound infection in a morbidly obese diabetic 6 week post fixation of a left intertroch/subtroch proximal femur fracture  1)  To the OR today for an irrigation/debridement of her left hip wound/incision followed by admission for IV antibiotics.  [name], MD 01/16/2017, 12:52 PM

## 2017-01-16 NOTE — Anesthesia Procedure Notes (Signed)
Procedure Name: Intubation Date/Time: 01/16/2017 2:34 PM Performed by: Talbot Grumbling Pre-anesthesia Checklist: Patient identified, Emergency Drugs available, Suction available and Patient being monitored Patient Re-evaluated:Patient Re-evaluated prior to induction Oxygen Delivery Method: Circle system utilized Preoxygenation: Pre-oxygenation with 100% oxygen Induction Type: IV induction Ventilation: Mask ventilation without difficulty Laryngoscope Size: Mac and 4 Grade View: Grade II Tube type: Oral Tube size: 7.5 mm Number of attempts: 1 Airway Equipment and Method: Stylet Placement Confirmation: ETT inserted through vocal cords under direct vision,  positive ETCO2 and breath sounds checked- equal and bilateral Secured at: 21 cm Tube secured with: Tape Dental Injury: Teeth and Oropharynx as per pre-operative assessment

## 2017-01-16 NOTE — Progress Notes (Signed)
Pharmacy Antibiotic Note  [name] is a 76 y.o. female admitted on 01/16/2017 with L hip wound infection.  Pharmacy has been consulted for vancomycin and zosyn dosing for L hip wound infection. S/p I&D of L hip 8/31.  Wt 124.3 kg.  6 weeks s/p ORIF L hip fx.  Has been at [facility].  Ancef 3 gm given preop at 3 pm.  AF. Cr 1.1.   Plan: vancomycin 2500 mg IV loading dose followed by vancomycin 1500 mg IV q24 For vancomycin trough goal 10-15 mcg/ml for hip wound infection. Zosyn 3.375g IV q8h (4 hour infusion).  F/u renal function, WBC, temp, culture data Vancomycin levels as needed Daily creatinine while on both vanc and zosyn  Height: _0  (162.6 cm) Weight: 274 lb (124.3 kg) IBW/kg (Calculated) : 54.7  Temp (24hrs), Avg:98.2 F (36.8 C), Min:98.1 F (36.7 C), Max:98.3 F (36.8 C)  No results for input(s): WBC, CREATININE, LATICACIDVEN, VANCOTROUGH, VANCOPEAK, VANCORANDOM, GENTTROUGH, GENTPEAK, GENTRANDOM, TOBRATROUGH, TOBRAPEAK, TOBRARND, AMIKACINPEAK, AMIKACINTROU, AMIKACIN in the last 168 hours.  CrCl cannot be calculated (Patient's most recent lab result is older than the maximum 21 days allowed.).    Allergies  Allergen Reactions  . Codeine Swelling    Facial swelling  . Remicade [Infliximab] Anaphylaxis    "sent me into shock"  . Zestril [Lisinopril] Swelling    Face and neck swelling    Antimicrobials this admission: 8/31 ancef 3 gm x 1 8/31 vanc >> 8/31 zosyn >> Dose adjustments this admission:  Microbiology results: 8/31 aerobic culture>>  Thank you for allowing pharmacy to be a part of this patient's care. Eudelia Bunch, Pharm.D. [phone] 01/16/2017 8:41 PM

## 2017-01-16 NOTE — Anesthesia Preprocedure Evaluation (Addendum)
Anesthesia Evaluation  Patient identified by MRN, date of birth, ID band Patient awake    Reviewed: Allergy & Precautions, NPO status , Patient's Chart, lab work & pertinent test results  Airway Mallampati: II  TM Distance: >3 FB Neck ROM: Full    Dental no notable dental hx.    Pulmonary shortness of breath, COPD,  Interstitial lung dz   Pulmonary exam normal breath sounds clear to auscultation       Cardiovascular hypertension, +CHF and + DOE  Normal cardiovascular exam Rhythm:Regular Rate:Normal  Left ventricle: The cavity size was normal. Wall thickness was   normal. Systolic function was normal. The estimated ejection   fraction was in the range of 55% to 60%. Wall motion was normal;   there were no regional wall motion abnormalities. Doppler   parameters are consistent with abnormal left ventricular   relaxation (grade 1 diastolic dysfunction). - Aortic valve: There was no stenosis. - Mitral valve: Mildly calcified annulus. Mildly calcified leaflets   . There was no significant regurgitation. - Right ventricle: The cavity size was normal. Systolic function   was normal. - Pulmonary arteries: No complete TR doppler jet so unable to   estimate PA systolic pressure. - Systemic veins: IVC measured 2.2 cm with < 50% respirophasic   variation, suggesting RA pressure 15 mmHg.  Impressions:  - Normal LV size with EF 55-60%. Normal RV size and systolic   function. No significant valvular abnormalities. Relatively   normal echo except IVC was dilated, suggesting elevated RV   filling pressure.  Transthoracic echocardiography.  M-mode, complete 2D, spectral Doppler, and color Doppler.  Birthdate:  Patient birthdate: May 23, 1940.  Age:  Patient is 76 yr old.  Sex:  Gender: female. BMI: 50.1 kg/m^2.  Blood pressure:     118/72  Patient status: Inpatient.  Study date:  Study date: 04/19/2015. Study time: 03:10 PM.  Location:   ICU/CCU    Neuro/Psych negative neurological ROS  negative psych ROS   GI/Hepatic negative GI ROS, Neg liver ROS,   Endo/Other  diabetes, Insulin DependentHypothyroidism   Renal/GU negative Renal ROS  negative genitourinary   Musculoskeletal  (+) Arthritis , Rheumatoid disorders,    Abdominal   Peds negative pediatric ROS (+)  Hematology negative hematology ROS (+)   Anesthesia Other Findings   Reproductive/Obstetrics negative OB ROS                            Anesthesia Physical Anesthesia Plan  ASA: III  Anesthesia Plan: General   Post-op Pain Management:    Induction: Intravenous  PONV Risk Score and Plan: 2 and Ondansetron and Dexamethasone  Airway Management Planned: Oral ETT  Additional Equipment:   Intra-op Plan:   Post-operative Plan: Extubation in OR  Informed Consent: I have reviewed the patients History and Physical, chart, labs and discussed the procedure including the risks, benefits and alternatives for the proposed anesthesia with the patient or authorized representative who has indicated his/her understanding and acceptance.   Dental advisory given  Plan Discussed with: CRNA and Surgeon  Anesthesia Plan Comments:         Anesthesia Quick Evaluation

## 2017-01-17 LAB — PREPARE RBC (CROSSMATCH)

## 2017-01-17 LAB — C-REACTIVE PROTEIN: CRP: 2.4 mg/dL — AB (ref <1.0)

## 2017-01-17 LAB — BASIC METABOLIC PANEL
ANION GAP: 3 — AB (ref 5–15)
BUN: 13 mg/dL (ref 6–20)
CO2: 34 mmol/L — ABNORMAL HIGH (ref 22–32)
Calcium: 8 mg/dL — ABNORMAL LOW (ref 8.9–10.3)
Chloride: 103 mmol/L (ref 101–111)
Creatinine, Ser: 1.05 mg/dL — ABNORMAL HIGH (ref 0.44–1.00)
GFR calc Af Amer: 59 mL/min — ABNORMAL LOW (ref >60)
GFR, EST NON AFRICAN AMERICAN: 51 mL/min — AB (ref >60)
Glucose, Bld: 123 mg/dL — ABNORMAL HIGH (ref 65–99)
POTASSIUM: 4.3 mmol/L (ref 3.5–5.1)
SODIUM: 140 mmol/L (ref 135–145)

## 2017-01-17 LAB — GLUCOSE, CAPILLARY
Glucose-Capillary: 101 mg/dL — ABNORMAL HIGH (ref 65–99)
Glucose-Capillary: 149 mg/dL — ABNORMAL HIGH (ref 65–99)
Glucose-Capillary: 155 mg/dL — ABNORMAL HIGH (ref 65–99)
Glucose-Capillary: 139 mg/dL — ABNORMAL HIGH (ref 65–99)

## 2017-01-17 LAB — CBC
HEMATOCRIT: 23.8 % — AB (ref 36.0–46.0)
Hemoglobin: 7.2 g/dL — ABNORMAL LOW (ref 12.0–15.0)
MCH: 27.3 pg (ref 26.0–34.0)
MCHC: 30.3 g/dL (ref 30.0–36.0)
MCV: 90.2 fL (ref 78.0–100.0)
Platelets: 315 10*3/uL (ref 150–400)
RBC: 2.64 MIL/uL — ABNORMAL LOW (ref 3.87–5.11)
RDW: 16.9 % — AB (ref 11.5–15.5)
WBC: 7.6 10*3/uL (ref 4.0–10.5)

## 2017-01-17 LAB — SEDIMENTATION RATE: SED RATE: 29 mm/h — AB (ref 0–22)

## 2017-01-17 LAB — ABO/RH: ABO/RH(D): O POS

## 2017-01-17 MED ORDER — FUROSEMIDE 10 MG/ML IJ SOLN
20.0000 mg | Freq: Once | INTRAMUSCULAR | Status: AC
  Administered 2017-01-17: 20 mg via INTRAVENOUS
  Filled 2017-01-17: qty 2

## 2017-01-17 MED ORDER — SODIUM CHLORIDE 0.9 % IV SOLN
Freq: Once | INTRAVENOUS | Status: AC
  Administered 2017-01-17: 15:00:00 via INTRAVENOUS

## 2017-01-17 MED ORDER — GABAPENTIN 400 MG PO CAPS
1200.0000 mg | ORAL_CAPSULE | Freq: Three times a day (TID) | ORAL | Status: DC
  Administered 2017-01-17 – 2017-01-21 (×13): 1200 mg via ORAL
  Filled 2017-01-17 (×14): qty 3

## 2017-01-17 NOTE — Progress Notes (Signed)
CRITICAL VALUE ALERT  Critical value received:  Hgb 7.2  Date of notification:  01/17/17  Time of notification:  0728  Critical value read back:Yes.    Nurse who received alert:  Daleen Bo  MD notified (1st page):  Called answering service for [place]  Time of first page:  [phone]  Responding MD:  Dr. Sharol Given  Time MD responded:  0740  No orders given. Patient will be assessed during MD rounds this morning.

## 2017-01-17 NOTE — Progress Notes (Signed)
OT Cancellation Note  Patient Details Name: [name] MRN: [id number] DOB: 10/01/1940   Cancelled Treatment:    Reason Eval/Treat Not Completed: Other (comment). OT order noted. Pt receiving blood transfusion this pm. Will attempt back next date.  Jae Dire Lorah Kalina 01/17/2017, 3:30 PM

## 2017-01-17 NOTE — Progress Notes (Signed)
Subjective: 1 Day Post-Op Procedure(s) (LRB): IRRIGATION AND DEBRIDEMENT LEFT HIP (Left) Patient reports pain as moderate.  Acute blood loose anemia with some fatigue.  Gram stain with rare gram positive cocci.  Objective: Vital signs in last 24 hours: Temp:  [98.1 F (36.7 C)-98.9 F (37.2 C)] 98.9 F (37.2 C) (09/01 1022) Pulse Rate:  [99-113] 100 (09/01 1022) Resp:  [11-19] 18 (09/01 1022) BP: (112-133)/(57-83) 120/75 (09/01 1022) SpO2:  [90 %-100 %] 94 % (09/01 1022) Weight:  [274 lb (124.3 kg)] 274 lb (124.3 kg) (08/31 1315)  Intake/Output from previous day: 08/31 0701 - 09/01 0700 In: 3530 [P.O.:480; I.V.:2450; IV Piggyback:600] Out: 1020 [Urine:650; Drains:20; Blood:350] Intake/Output this shift: Total I/O In: 542.5 [P.O.:240; I.V.:302.5] Out: 140 [Urine:100; Drains:40]   Recent Labs  01/16/17 1336 01/17/17 0541  HGB 11.2* 7.2*    Recent Labs  01/16/17 1336 01/17/17 0541  WBC  --  7.6  RBC  --  2.64*  HCT 33.0* 23.8*  PLT  --  315    Recent Labs  01/16/17 1839 01/17/17 0541  NA 139 140  K 4.0 4.3  CL 100* 103  CO2 32 34*  BUN 14 13  CREATININE 1.10* 1.05*  GLUCOSE 169* 123*  CALCIUM 8.1* 8.0*   No results for input(s): LABPT, INR in the last 72 hours.  Sensation intact distally Intact pulses distally Dorsiflexion/Plantar flexion intact Incision: scant drainage No cellulitis present Compartment soft  Assessment/Plan: 1 Day Post-Op Procedure(s) (LRB): IRRIGATION AND DEBRIDEMENT LEFT HIP (Left) Up with therapy Continue ABX therapy due to Post-op infection  Can attempt full weight bearing as tolerated on her left hip. Will transfuse 2 units of blood given her symptomatic anemia.   [name] 01/17/2017, 10:31 AM

## 2017-01-17 NOTE — Progress Notes (Signed)
PT Cancellation Note  Patient Details Name: [name] MRN: [id number] DOB: March 07, 1941   Cancelled Treatment:     PT order received but eval deferred 2* pt with Hgb 7.2 and blood transfusion being initiated this pm.  Will follow in am.   Oveda Dadamo 01/17/2017, 2:45 PM

## 2017-01-18 LAB — BPAM RBC
BLOOD PRODUCT EXPIRATION DATE: 201810032359
BLOOD PRODUCT EXPIRATION DATE: 201810032359
ISSUE DATE / TIME: 201809011420
ISSUE DATE / TIME: 201809011831
UNIT TYPE AND RH: 5100
UNIT TYPE AND RH: 5100

## 2017-01-18 LAB — BASIC METABOLIC PANEL
Anion gap: 6 (ref 5–15)
BUN: 15 mg/dL (ref 6–20)
CALCIUM: 7.8 mg/dL — AB (ref 8.9–10.3)
CO2: 35 mmol/L — ABNORMAL HIGH (ref 22–32)
CREATININE: 1.3 mg/dL — AB (ref 0.44–1.00)
Chloride: 101 mmol/L (ref 101–111)
GFR calc Af Amer: 45 mL/min — ABNORMAL LOW (ref >60)
GFR, EST NON AFRICAN AMERICAN: 39 mL/min — AB (ref >60)
Glucose, Bld: 126 mg/dL — ABNORMAL HIGH (ref 65–99)
Potassium: 4 mmol/L (ref 3.5–5.1)
SODIUM: 142 mmol/L (ref 135–145)

## 2017-01-18 LAB — CBC
HEMATOCRIT: 26.9 % — AB (ref 36.0–46.0)
HEMOGLOBIN: 8.4 g/dL — AB (ref 12.0–15.0)
MCH: 28.1 pg (ref 26.0–34.0)
MCHC: 31.2 g/dL (ref 30.0–36.0)
MCV: 90 fL (ref 78.0–100.0)
Platelets: 255 10*3/uL (ref 150–400)
RBC: 2.99 MIL/uL — ABNORMAL LOW (ref 3.87–5.11)
RDW: 16.9 % — AB (ref 11.5–15.5)
WBC: 10.6 10*3/uL — AB (ref 4.0–10.5)

## 2017-01-18 LAB — TYPE AND SCREEN
ABO/RH(D): O POS
ANTIBODY SCREEN: NEGATIVE
UNIT DIVISION: 0
Unit division: 0

## 2017-01-18 LAB — GLUCOSE, CAPILLARY
GLUCOSE-CAPILLARY: 112 mg/dL — AB (ref 65–99)
Glucose-Capillary: 134 mg/dL — ABNORMAL HIGH (ref 65–99)
Glucose-Capillary: 110 mg/dL — ABNORMAL HIGH (ref 65–99)
Glucose-Capillary: 171 mg/dL — ABNORMAL HIGH (ref 65–99)

## 2017-01-18 LAB — VANCOMYCIN, TROUGH: Vancomycin Tr: 18 ug/mL (ref 15–20)

## 2017-01-18 NOTE — Anesthesia Postprocedure Evaluation (Signed)
Anesthesia Post Note  Patient: [name]  Procedure(s) Performed: Procedure(s) (LRB): IRRIGATION AND DEBRIDEMENT LEFT HIP (Left)     Patient location during evaluation: PACU Anesthesia Type: General Level of consciousness: awake and alert Pain management: pain level controlled Vital Signs Assessment: post-procedure vital signs reviewed and stable Respiratory status: spontaneous breathing, nonlabored ventilation, respiratory function stable and patient connected to nasal cannula oxygen Cardiovascular status: blood pressure returned to baseline and stable Postop Assessment: no signs of nausea or vomiting Anesthetic complications: no    Last Vitals:  Vitals:   01/18/17 0502 01/18/17 0956  BP: 122/79 121/60  Pulse: 85 94  Resp: 18   Temp: 36.9 C   SpO2: 99%     Last Pain:  Vitals:   01/18/17 0847  TempSrc:   PainSc: 5                  Otila Starn S

## 2017-01-18 NOTE — Progress Notes (Signed)
Patient ID: [name], female   DOB: 1941/05/19, 76 y.o.   MRN: [id number] Patient is postoperative day 2 status post irrigation and debridement left hip wound. She is currently on IV antibiotics. Anticipate discharge on Tuesday continue IV antibiotics patient has no complaints this morning.

## 2017-01-18 NOTE — Progress Notes (Signed)
Physical Therapy Treatment Patient Details Name: [name] MRN: [id number] DOB: Mar 08, 1941 Today's Date: 01/18/2017    History of Present Illness pt is s/p L hip I & D due to infection. Had ORIF for comminuted fx originally.  H/O DM    PT Comments    PM session Assisted with amb in hallway then back to bed.  Pt c/o L thigh cramps which she stated she gets "sometimes".   Progressing well with her mobility.  Plans to return to [facility] where "they know me".    Follow Up Recommendations  SNF     Equipment Recommendations  None recommended by PT    Recommendations for Other Services       Precautions / Restrictions Precautions Precautions: Fall Precaution Comments: wound VAC/drain Restrictions Weight Bearing Restrictions: No Other Position/Activity Restrictions: WBAT    Mobility  Bed Mobility Overal bed mobility: Needs Assistance Bed Mobility: Sit to Supine       Sit to supine: Min assist   General bed mobility comments: min assist L LE up onto bed  Transfers Overall transfer level: Needs assistance Equipment used: Rolling walker (2 wheeled) Transfers: Sit to/from Stand Sit to Stand: Min assist         General transfer comment: assist to rise and steady. Cues for UE/LE placement  increased time/caution to multiple lines  Ambulation/Gait Ambulation/Gait assistance: Min assist;+2 safety/equipment Ambulation Distance (Feet): 25 Feet Assistive device: Rolling walker (2 wheeled) Gait Pattern/deviations: Step-to pattern;Step-through pattern;Shuffle;Trunk flexed Gait velocity: decr   General Gait Details: < 25% cues for posture, position from RW and sequence   amb on 2 lts nasal sats 95% dyspnea 2/4   Stairs            Wheelchair Mobility    Modified Rankin (Stroke Patients Only)       Balance                                            Cognition Arousal/Alertness: Awake/alert Behavior During Therapy: WFL for tasks  assessed/performed Overall Cognitive Status: Within Functional Limits for tasks assessed                                        Exercises      General Comments        Pertinent Vitals/Pain Pain Assessment: Faces Faces Pain Scale: Hurts little more Pain Location: L thigh cramping Pain Descriptors / Indicators: Cramping Pain Intervention(s): Monitored during session;Repositioned;Ice applied    Home Living                      Prior Function            PT Goals (current goals can now be found in the care plan section) Progress towards PT goals: Progressing toward goals    Frequency    Min 3X/week      PT Plan Current plan remains appropriate    Co-evaluation              AM-PAC PT "6 Clicks" Daily Activity  Outcome Measure  Difficulty turning over in bed (including adjusting bedclothes, sheets and blankets)?: Unable Difficulty moving from lying on back to sitting on the side of the bed? : Unable Difficulty sitting down on and standing up  from a chair with arms (e.g., wheelchair, bedside commode, etc,.)?: Unable Help needed moving to and from a bed to chair (including a wheelchair)?: A Little Help needed walking in hospital room?: A Little Help needed climbing 3-5 steps with a railing? : A Lot 6 Click Score: 11    End of Session Equipment Utilized During Treatment: Oxygen;Gait belt Activity Tolerance: Patient tolerated treatment well;Patient limited by fatigue Patient left: in bed;with call bell/phone within reach;with bed alarm set Nurse Communication: Mobility status PT Visit Diagnosis: Difficulty in walking, not elsewhere classified (R26.2)     Time: 1350-1415 PT Time Calculation (min) (ACUTE ONLY): 25 min  Charges:  $Gait Training: 8-22 mins $Therapeutic Activity: 8-22 mins                    G Codes:       Rica Koyanagi  PTA [facility]  Acute  Rehab Pager      [phone]

## 2017-01-18 NOTE — Evaluation (Signed)
Occupational Therapy Evaluation Patient Details Name: [name] MRN: [id number] DOB: 1940/10/29 Today's Date: 01/18/2017    History of Present Illness pt is s/p L hip I & D due to drainage/infection. Had ORIF for comminuted fx originally.  H/O DM   Clinical Impression   This 76 year old female was admitted for the above.  She was at [facility] for rehab prior to this admission. She is very motivated and will benefit from continued OT in acute setting. Goals in acute are for min guard overall.  She needs min +2 assistance for mobility and up to mod/max A for LB adls.    Follow Up Recommendations  SNF    Equipment Recommendations  3 in 1 bedside commode (may need wide)    Recommendations for Other Services       Precautions / Restrictions Precautions Precautions: Fall Restrictions Weight Bearing Restrictions: No      Mobility Bed Mobility Overal bed mobility: Needs Assistance Bed Mobility: Supine to Sit     Supine to sit: Min assist;+2 for safety/equipment;HOB elevated     General bed mobility comments: cues fro sequence. Pt used rail to assist trunk  Transfers Overall transfer level: Needs assistance Equipment used: Rolling walker (2 wheeled) Transfers: Sit to/from Stand Sit to Stand: Min assist;+2 safety/equipment;From elevated surface         General transfer comment: assist to rise and steady. Cues for UE/LE placement    Balance                                           ADL either performed or assessed with clinical judgement   ADL Overall ADL's : Needs assistance/impaired Eating/Feeding: Independent   Grooming: Set up;Sitting   Upper Body Bathing: Set up;Sitting   Lower Body Bathing: Moderate assistance;+2 for safety/equipment;Sit to/from stand   Upper Body Dressing : Set up;Sitting   Lower Body Dressing: Maximal assistance;Sit to/from stand;+2 for safety/equipment   Toilet Transfer: Minimal assistance;BSC;RW;+2 for  safety/equipment (to chair)             General ADL Comments: pt has catheter in place.  Ambulated in hallway Min +2 safety     Vision         Perception     Praxis      Pertinent Vitals/Pain Pain Assessment: 0-10 Pain Score: 5  Pain Location: Lhip Pain Descriptors / Indicators: Sore Pain Intervention(s): Limited activity within patient's tolerance;Monitored during session;Premedicated before session;Repositioned;Ice applied     Hand Dominance Right   Extremity/Trunk Assessment Upper Extremity Assessment Upper Extremity Assessment:  (grossly 4-/5.  L grip 4-/5)           Communication Communication Communication: No difficulties   Cognition Arousal/Alertness: Awake/alert Behavior During Therapy: WFL for tasks assessed/performed Overall Cognitive Status: Within Functional Limits for tasks assessed                                     General Comments  bumped pt up to 3 liters of 02 when walking. At baseline, she uses 02 for exertion    Exercises     Shoulder Instructions      Home Living Family/patient expects to be discharged to:: Skilled nursing facility  Additional Comments: was at [facility] for rehab      Prior Functioning/Environment          Comments: assist for mobility and adls at SNF. Was using sock aide        OT Problem List: Decreased strength;Decreased activity tolerance;Impaired balance (sitting and/or standing);Decreased knowledge of use of DME or AE;Pain;Cardiopulmonary status limiting activity      OT Treatment/Interventions: Self-care/ADL training;DME and/or AE instruction;Patient/family education;Balance training;Therapeutic activities    OT Goals(Current goals can be found in the care plan section) Acute Rehab OT Goals Patient Stated Goal: back to rehab to return to independence OT Goal Formulation: With patient Time For Goal Achievement: 01/25/17 Potential to  Achieve Goals: Good ADL Goals Pt Will Perform Grooming: with min guard assist;standing Pt Will Perform Lower Body Bathing: with min guard assist;sit to/from stand Pt Will Perform Lower Body Dressing: with min guard assist;with adaptive equipment;sit to/from stand Pt Will Transfer to Toilet: with min guard assist;ambulating;bedside commode Pt Will Perform Toileting - Clothing Manipulation and hygiene: with min guard assist;sit to/from stand  OT Frequency: Min 2X/week   Barriers to D/C:            Co-evaluation              AM-PAC PT "6 Clicks" Daily Activity     Outcome Measure Help from another person eating meals?: None Help from another person taking care of personal grooming?: A Little Help from another person toileting, which includes using toliet, bedpan, or urinal?: A Little Help from another person bathing (including washing, rinsing, drying)?: A Lot Help from another person to put on and taking off regular upper body clothing?: A Little Help from another person to put on and taking off regular lower body clothing?: A Lot 6 Click Score: 17   End of Session    Activity Tolerance: Patient limited by fatigue Patient left: in chair;with call bell/phone within reach  OT Visit Diagnosis: Pain Pain - Right/Left: Left Pain - part of body: Hip                Time: 9672-2773 OT Time Calculation (min): 38 min Charges:  OT General Charges $OT Visit: 1 Visit OT Evaluation $OT Eval Low Complexity: 1 Low G-Codes:     [place], OTR/L [phone] 01/18/2017  Advit Trethewey 01/18/2017, 10:02 AM

## 2017-01-18 NOTE — Progress Notes (Signed)
Pharmacy Antibiotic Note  [name] is a 76 y.o. female admitted on 01/16/2017 with L hip wound infection.  Pharmacy has been consulted for vancomycin and zosyn dosing for L hip wound infection. S/p I&D of L hip 8/31.   01/18/2017 Scr 1.3, CrCl ~ 25ms/min (N) WBC 10.6 Afebrile  Plan: Due to increase in Scr will check vanc level tonight, note not at steady state Currently vancomycin 15038mIV q24h Continue Zosyn 3.375g IV Q8H infused over 4hrs. Daily creatinine while on both vanc and zosyn  Height: _0  (162.6 cm) Weight: 274 lb (124.3 kg) IBW/kg (Calculated) : 54.7  Temp (24hrs), Avg:98.8 F (37.1 C), Min:98.2 F (36.8 C), Max:99.1 F (37.3 C)   Recent Labs Lab 01/16/17 1839 01/17/17 0541 01/18/17 0453  WBC  --  7.6 10.6*  CREATININE 1.10* 1.05* 1.30*    Estimated Creatinine Clearance: 48.7 mL/min (A) (by C-G formula based on SCr of 1.3 mg/dL (H)).    Allergies  Allergen Reactions  . Codeine Swelling    Facial swelling  . Remicade [Infliximab] Anaphylaxis    "sent me into shock"  . Zestril [Lisinopril] Swelling    Face and neck swelling    Antimicrobials this admission: 8/31 ancef 3 gm x 1 8/31 vanc >> 8/31 zosyn >> Dose adjustments this admission:  Microbiology results: 8/31 aerobic culture>> few staph aureus  ElDolly RiasPh 01/18/2017, 12:33 PM Pager [phone]

## 2017-01-18 NOTE — Progress Notes (Signed)
Pharmacy Antibiotic Note  [name] is a 76 y.o. female admitted on 01/16/2017 with L hip wound infection.  Pharmacy has been consulted for vancomycin and zosyn dosing for L hip wound infection. S/p I&D of L hip 8/31.   01/18/2017 Scr 1.3, CrCl ~ 23ms/min (N) WBC 10.6 Afebrile  VT @ 19:03 = 18 (within desired range; drawn a little early as last dose charted on 9/1 @ 23:08)  Plan: Continue vancomycin 15067mIV q24h Continue Zosyn 3.375g IV Q8H infused over 4hrs. Daily creatinine while on both vanc and zosyn  Height: _0  (162.6 cm) Weight: 274 lb (124.3 kg) IBW/kg (Calculated) : 54.7  Temp (24hrs), Avg:98.5 F (36.9 C), Min:97.7 F (36.5 C), Max:98.9 F (37.2 C)   Recent Labs Lab 01/16/17 1839 01/17/17 0541 01/18/17 0453 01/18/17 1903  WBC  --  7.6 10.6*  --   CREATININE 1.10* 1.05* 1.30*  --   VANCOTROUGH  --   --   --  18    Estimated Creatinine Clearance: 48.7 mL/min (A) (by C-G formula based on SCr of 1.3 mg/dL (H)).    Allergies  Allergen Reactions  . Codeine Swelling    Facial swelling  . Remicade [Infliximab] Anaphylaxis    "sent me into shock"  . Zestril [Lisinopril] Swelling    Face and neck swelling    Antimicrobials this admission: 8/31 ancef 3 gm x 1 8/31 vanc >> 8/31 zosyn >> Dose adjustments this admission:  Microbiology results: 8/31 aerobic culture>> few staph aureus  LeLeone HavenPharmD 01/18/2017, 9:05 PM

## 2017-01-18 NOTE — Evaluation (Signed)
Physical Therapy Evaluation Patient Details Name: [name] MRN: [id number] DOB: December 20, 1940 Today's Date: 01/18/2017   History of Present Illness  pt is s/p L hip I & D due to infection. Had ORIF for comminuted fx originally.  H/O DM  Clinical Impression  Pt admitted as above and presenting with functional mobility limitations 2* decreased L LE strength/ROM, post op pain, and obesity.  Pt would benefit from follow up rehab at SNF level to maximize IND and safety prior to return home with ltd assist.    Follow Up Recommendations SNF    Equipment Recommendations  None recommended by PT    Recommendations for Other Services       Precautions / Restrictions Precautions Precautions: Fall Restrictions Weight Bearing Restrictions: No Other Position/Activity Restrictions: WBAT      Mobility  Bed Mobility Overal bed mobility: Needs Assistance Bed Mobility: Supine to Sit     Supine to sit: Min assist;+2 for safety/equipment;HOB elevated     General bed mobility comments: cues for sequence. Pt used rail to assist trunk  Transfers Overall transfer level: Needs assistance Equipment used: Rolling walker (2 wheeled) Transfers: Sit to/from Stand Sit to Stand: Min assist;+2 safety/equipment;From elevated surface         General transfer comment: assist to rise and steady. Cues for UE/LE placement  Ambulation/Gait Ambulation/Gait assistance: Min assist;+2 safety/equipment Ambulation Distance (Feet): 22 Feet Assistive device: Rolling walker (2 wheeled) Gait Pattern/deviations: Step-to pattern;Step-through pattern;Shuffle;Trunk flexed Gait velocity: decr Gait velocity interpretation: Below normal speed for age/gender General Gait Details: cues for posture, position from RW and sequence  Stairs            Wheelchair Mobility    Modified Rankin (Stroke Patients Only)       Balance Overall balance assessment: Needs assistance Sitting-balance support: No upper  extremity supported;Feet supported Sitting balance-Leahy Scale: Good     Standing balance support: Bilateral upper extremity supported Standing balance-Leahy Scale: Poor                               Pertinent Vitals/Pain Pain Assessment: 0-10 Pain Score: 5  Pain Location: Lhip Pain Descriptors / Indicators: Sore Pain Intervention(s): Limited activity within patient's tolerance;Monitored during session;Premedicated before session;Ice applied    Home Living Family/patient expects to be discharged to:: Skilled nursing facility                 Additional Comments: was at [facility] for rehab    Prior Function Level of Independence: Needs assistance   Gait / Transfers Assistance Needed: using RW at SNF rehab     Comments: assist for mobility and adls at SNF. Was using sock aide     Hand Dominance   Dominant Hand: Right    Extremity/Trunk Assessment   Upper Extremity Assessment Upper Extremity Assessment: Defer to OT evaluation    Lower Extremity Assessment Lower Extremity Assessment: LLE deficits/detail       Communication   Communication: No difficulties  Cognition Arousal/Alertness: Awake/alert Behavior During Therapy: WFL for tasks assessed/performed Overall Cognitive Status: Within Functional Limits for tasks assessed                                        General Comments General comments (skin integrity, edema, etc.): bumped pt up to 3 liters of 02 when walking. At baseline,  she uses 02 for exertion    Exercises General Exercises - Lower Extremity Ankle Circles/Pumps: AROM;Both;15 reps;Supine   Assessment/Plan    PT Assessment Patient needs continued PT services  PT Problem List Decreased strength;Decreased range of motion;Decreased activity tolerance;Decreased balance;Decreased mobility;Decreased knowledge of use of DME;Pain;Obesity       PT Treatment Interventions DME instruction;Gait training;Functional  mobility training;Therapeutic activities;Therapeutic exercise;Patient/family education;Balance training    PT Goals (Current goals can be found in the Care Plan section)  Acute Rehab PT Goals Patient Stated Goal: back to rehab to return to independence PT Goal Formulation: With patient Time For Goal Achievement: 01/22/17 Potential to Achieve Goals: Good    Frequency Min 3X/week   Barriers to discharge        Co-evaluation PT/OT/SLP Co-Evaluation/Treatment: Yes Reason for Co-Treatment: For patient/therapist safety PT goals addressed during session: Mobility/safety with mobility OT goals addressed during session: ADL's and self-care       AM-PAC PT "6 Clicks" Daily Activity  Outcome Measure Difficulty turning over in bed (including adjusting bedclothes, sheets and blankets)?: Unable Difficulty moving from lying on back to sitting on the side of the bed? : Unable Difficulty sitting down on and standing up from a chair with arms (e.g., wheelchair, bedside commode, etc,.)?: Unable Help needed moving to and from a bed to chair (including a wheelchair)?: A Little Help needed walking in hospital room?: A Little Help needed climbing 3-5 steps with a railing? : A Lot 6 Click Score: 11    End of Session Equipment Utilized During Treatment: Oxygen;Gait belt Activity Tolerance: Patient tolerated treatment well;Patient limited by fatigue Patient left: in chair;with call bell/phone within reach;with nursing/sitter in room Nurse Communication: Mobility status PT Visit Diagnosis: Difficulty in walking, not elsewhere classified (R26.2)    Time: 5732-2025 PT Time Calculation (min) (ACUTE ONLY): 38 min   Charges:   PT Evaluation $PT Eval Low Complexity: 1 Low PT Treatments $Gait Training: 8-22 mins   PT G Codes:        Pg [phone]   Providence Stivers 01/18/2017, 10:07 AM

## 2017-01-19 LAB — BASIC METABOLIC PANEL
ANION GAP: 6 (ref 5–15)
BUN: 15 mg/dL (ref 6–20)
CHLORIDE: 104 mmol/L (ref 101–111)
CO2: 33 mmol/L — ABNORMAL HIGH (ref 22–32)
Calcium: 7.9 mg/dL — ABNORMAL LOW (ref 8.9–10.3)
Creatinine, Ser: 1.24 mg/dL — ABNORMAL HIGH (ref 0.44–1.00)
GFR calc Af Amer: 48 mL/min — ABNORMAL LOW (ref >60)
GFR calc non Af Amer: 41 mL/min — ABNORMAL LOW (ref >60)
Glucose, Bld: 119 mg/dL — ABNORMAL HIGH (ref 65–99)
POTASSIUM: 3.9 mmol/L (ref 3.5–5.1)
SODIUM: 143 mmol/L (ref 135–145)

## 2017-01-19 LAB — AEROBIC CULTURE W GRAM STAIN (SUPERFICIAL SPECIMEN)

## 2017-01-19 LAB — CBC
HCT: 28.9 % — ABNORMAL LOW (ref 36.0–46.0)
HEMOGLOBIN: 8.7 g/dL — AB (ref 12.0–15.0)
MCH: 27.1 pg (ref 26.0–34.0)
MCHC: 30.1 g/dL (ref 30.0–36.0)
MCV: 90 fL (ref 78.0–100.0)
Platelets: 281 10*3/uL (ref 150–400)
RBC: 3.21 MIL/uL — AB (ref 3.87–5.11)
RDW: 16.8 % — ABNORMAL HIGH (ref 11.5–15.5)
WBC: 10 10*3/uL (ref 4.0–10.5)

## 2017-01-19 LAB — GLUCOSE, CAPILLARY
GLUCOSE-CAPILLARY: 105 mg/dL — AB (ref 65–99)
GLUCOSE-CAPILLARY: 178 mg/dL — AB (ref 65–99)
GLUCOSE-CAPILLARY: 135 mg/dL — AB (ref 65–99)
Glucose-Capillary: 132 mg/dL — ABNORMAL HIGH (ref 65–99)

## 2017-01-19 LAB — AEROBIC CULTURE  (SUPERFICIAL SPECIMEN)

## 2017-01-19 NOTE — Progress Notes (Signed)
Physical Therapy Treatment Patient Details Name: [name] MRN: [id number] DOB: 1940-06-25 Today's Date: 01/19/2017    History of Present Illness pt is s/p L hip I & D due to infection. Had ORIF for comminuted fx originally.  H/O DM    PT Comments    Daughter and 2 grandchildren visiting during session and assisted pushing recliner and IV pole.  Pt tolerated an increased distance.  Amb on 2 lts oxygen due to dyspnea.  Assisted back to bed and applied ICE to L hip.   Follow Up Recommendations  SNF     Equipment Recommendations  None recommended by PT    Recommendations for Other Services       Precautions / Restrictions Precautions Precautions: Fall Restrictions Weight Bearing Restrictions: No Other Position/Activity Restrictions: WBAT    Mobility  Bed Mobility Overal bed mobility: Needs Assistance Bed Mobility: Sit to Supine     Supine to sit: Min guard;Supervision     General bed mobility comments: assisted back to bed  Transfers Overall transfer level: Needs assistance Equipment used: Rolling walker (2 wheeled) Transfers: Sit to/from Stand Sit to Stand: Supervision;Min guard         General transfer comment: increased ability to self rise.  one VC safety with turns.   Ambulation/Gait Ambulation/Gait assistance: Supervision;Min guard Ambulation Distance (Feet): 45 Feet Assistive device: Rolling walker (2 wheeled) Gait Pattern/deviations: Step-to pattern;Step-through pattern;Shuffle;Trunk flexed Gait velocity: decr   General Gait Details: < 25% cues for posture, position from RW and sequence   amb on 2 lts nasal sats 95% dyspnea 2/4.  Pt like O2 for activity/comfort   Stairs            Wheelchair Mobility    Modified Rankin (Stroke Patients Only)       Balance                                            Cognition Arousal/Alertness: Awake/alert Behavior During Therapy: WFL for tasks assessed/performed Overall  Cognitive Status: Within Functional Limits for tasks assessed                                        Exercises      General Comments        Pertinent Vitals/Pain Pain Assessment: 0-10 Pain Score: 3  Pain Location: L thigh cramping Pain Descriptors / Indicators: Cramping Pain Intervention(s): Monitored during session;Repositioned;Ice applied    Home Living                      Prior Function            PT Goals (current goals can now be found in the care plan section) Progress towards PT goals: Progressing toward goals    Frequency    Min 3X/week      PT Plan Current plan remains appropriate    Co-evaluation              AM-PAC PT "6 Clicks" Daily Activity  Outcome Measure  Difficulty turning over in bed (including adjusting bedclothes, sheets and blankets)?: Unable Difficulty moving from lying on back to sitting on the side of the bed? : Unable Difficulty sitting down on and standing up from a chair with arms (e.g., wheelchair, bedside  commode, etc,.)?: Unable Help needed moving to and from a bed to chair (including a wheelchair)?: A Little Help needed walking in hospital room?: A Little Help needed climbing 3-5 steps with a railing? : A Lot 6 Click Score: 11    End of Session Equipment Utilized During Treatment: Oxygen;Gait belt Activity Tolerance: Patient tolerated treatment well;Patient limited by fatigue Patient left: in chair;with chair alarm set;with call bell/phone within reach Nurse Communication: Mobility status PT Visit Diagnosis: Difficulty in walking, not elsewhere classified (R26.2)     Time: 4136-4383 PT Time Calculation (min) (ACUTE ONLY): 30 min  Charges:  $Gait Training: 8-22 mins $Therapeutic Activity: 8-22 mins                    G Codes:       {Dominika Losey  PTA [facility]  Acute  Rehab Pager      [phone]

## 2017-01-19 NOTE — Progress Notes (Signed)
Physical Therapy Treatment Patient Details Name: [name] MRN: [id number] DOB: 11-May-1941 Today's Date: 01/19/2017    History of Present Illness pt is s/p L hip I & D due to infection. Had ORIF for comminuted fx originally.  H/O DM    PT Comments    Drain and wound VAC D/C'd.  Assisted OOB with less assist.  Assisted with amb a greater distance.  Pt does like to amb on 2 lts oxygen (comfort) and is present with dyspnea with excursion.  Requires rest breaks between activity.     Follow Up Recommendations  SNF     Equipment Recommendations  None recommended by PT    Recommendations for Other Services       Precautions / Restrictions Precautions Precautions: Fall Restrictions Weight Bearing Restrictions: No Other Position/Activity Restrictions: WBAT    Mobility  Bed Mobility Overal bed mobility: Needs Assistance Bed Mobility: Supine to Sit     Supine to sit: Min guard;Supervision     General bed mobility comments: increased ability to slide L LE off bed.  HOB elevated and use of rail.  Increased time.  Transfers Overall transfer level: Needs assistance Equipment used: Rolling walker (2 wheeled) Transfers: Sit to/from Stand Sit to Stand: Supervision;Min guard         General transfer comment: increased ability to self rise.  one VC safety with turns.   Ambulation/Gait Ambulation/Gait assistance: Supervision;Min guard Ambulation Distance (Feet): 36 Feet Assistive device: Rolling walker (2 wheeled) Gait Pattern/deviations: Step-to pattern;Step-through pattern;Shuffle;Trunk flexed Gait velocity: decr   General Gait Details: < 25% cues for posture, position from RW and sequence   amb on 2 lts nasal sats 95% dyspnea 2/4.  Pt like O2 for activity/comfort   Stairs            Wheelchair Mobility    Modified Rankin (Stroke Patients Only)       Balance                                            Cognition Arousal/Alertness:  Awake/alert Behavior During Therapy: WFL for tasks assessed/performed Overall Cognitive Status: Within Functional Limits for tasks assessed                                        Exercises      General Comments        Pertinent Vitals/Pain Pain Assessment: 0-10 Pain Score: 3  Pain Location: L thigh cramping Pain Descriptors / Indicators: Cramping Pain Intervention(s): Monitored during session;Repositioned;Ice applied    Home Living                      Prior Function            PT Goals (current goals can now be found in the care plan section) Progress towards PT goals: Progressing toward goals    Frequency    Min 3X/week      PT Plan Current plan remains appropriate    Co-evaluation              AM-PAC PT "6 Clicks" Daily Activity  Outcome Measure  Difficulty turning over in bed (including adjusting bedclothes, sheets and blankets)?: Unable Difficulty moving from lying on back to sitting on the side of the  bed? : Unable Difficulty sitting down on and standing up from a chair with arms (e.g., wheelchair, bedside commode, etc,.)?: Unable Help needed moving to and from a bed to chair (including a wheelchair)?: A Little Help needed walking in hospital room?: A Little Help needed climbing 3-5 steps with a railing? : A Lot 6 Click Score: 11    End of Session Equipment Utilized During Treatment: Oxygen;Gait belt Activity Tolerance: Patient tolerated treatment well;Patient limited by fatigue Patient left: in chair;with chair alarm set;with call bell/phone within reach Nurse Communication: Mobility status PT Visit Diagnosis: Difficulty in walking, not elsewhere classified (R26.2)     Time: 4353-9122 PT Time Calculation (min) (ACUTE ONLY): 30 min  Charges:  $Gait Training: 8-22 mins $Therapeutic Activity: 8-22 mins                    G Codes:       Rica Koyanagi  PTA [facility]  Acute  Rehab Pager      [phone]

## 2017-01-19 NOTE — Op Note (Signed)
[name], [name]                  ACCOUNT NO.:  MEDICAL RECORD NO.:  [id number]  LOCATION:                                 FACILITY:  PHYSICIAN:  [name], M.D.DATE OF BIRTH:  1940-07-20  DATE OF PROCEDURE:  01/16/2017 DATE OF DISCHARGE:                              OPERATIVE REPORT   PREOPERATIVE DIAGNOSIS:  Left hip wound infection, 6 weeks, status post open reduction and internal fixation of a complex left intertrochanteric/subtrochanteric fracture in a morbidly obese 76 year old diabetic.  POSTOPERATIVE DIAGNOSIS:  Left hip wound infection, 6 weeks, status post open reduction and internal fixation of a complex left intertrochanteric/subtrochanteric fracture in a morbidly obese 76 year old diabetic.  PROCEDURE:  Irrigation and debridement of left hip incision, and superficial and deep adipose tissue with sharp excisional debridement of necrotic adipose tissue.  SURGEON:  [name], M.D.  ASSISTANT:  Erskine Emery, PA-C.  ANESTHESIA:  General.  BLOOD LOSS:  500 mL.  COMPLICATIONS:  None.  INDICATIONS:  [name] is a 76 year old, morbidly obese, diabetic female, who sustained a mechanical fall in July of this year injuring her left hip.  She had a complex, comminuted proximal femur fracture and on the day of her injury, she underwent open reduction and internal fixation of this fracture.  Normally, incisions were made decently small for an intertrochanteric fracture for IM rodding with hip screw, but due to her morbid obesity in this area, we had to make a large wide incision.  She has since been convalescing in [place] and has apparently had good diabetic control, but she presented this week with radiographs showing her fracture is healing; however, she developed two small openings with serous drainage with slight purulent tinge to them.  She was started on oral antibiotics by the nursing facility before sent to  me.  When I saw this wound, she had no significant cellulitis, but some induration, but I recommended she undergo an irrigation and debridement and attempts to retain the hardware for fracture healing purposes.  The risks and benefits of this were explained to her in detail and she does wish to proceed.  PROCEDURE DESCRIPTION:  After informed consent was obtained, appropriate left hip was marked.  She was brought to the operating room, placed supine on the operating table.  General anesthesia was then obtained.  A bump was placed under her hip and her left hip incision was prepped and draped with DuraPrep and sterile drapes.  Time-out was called and she was identified as correct patient and correct left hip.  I then made incision over previous lateral hip incision and found a large seroma and necrotic adipose tissue.  I did send off for Gram stain and cultures.  I then used #10 blade to ellipse out two small sinus tracts that were at the skin level and then sharply debride necrotic adipose tissue that went significantly deep.  We did not expose any hardware and did not find any gross purulence deeply; however, there was good abundant bleeding tissue.  We then irrigated the tissue with normal saline solution using pulsatile lavage.  Once we were done with this, I placed a  deep medium Hemovac drain, loosely approximated the adipose tissue and then closed the skin with interrupted 2-0 nylon suture.  I did place an incisional VAC over the incision as well to help with drainage.  She was awakened, extubated and taken to the recovery room in stable condition. All final counts were correct.  There were no complications noted. Postoperatively, we will keep her on IV antibiotics with a likelihood of needing a PICC line and more long-term antibiotics will depend on we find with our cultures and how she responds to antibiotics over the next few days as her cultures are pending.  Of note, Erskine Emery, PA-C's assistance was definitely necessary given her morbid obesity.     [name], M.D.     CYB/MEDQ  D:  01/16/2017  T:  01/17/2017  Job:  [id number]

## 2017-01-19 NOTE — NC FL2 (Signed)
[place] LEVEL OF CARE SCREENING TOOL     IDENTIFICATION  Patient Name: [name] Birthdate: Mar 21, 1941 Sex: female Admission Date (Current Location): 01/16/2017  [facility] and [place] Number:  Herbalist and Address:  [facility],  [address] [place], [place]      Provider Number: [id number]  Attending Physician Name and Address:  [name]  Relative Name and Phone Number:       Current Level of Care: Hospital Recommended Level of Care: [place] Prior Approval Number:    Date Approved/Denied:   PASRR Number:    Discharge Plan: SNF    Current Diagnoses: Patient Active Problem List   Diagnosis Date Noted  . Wound infection after surgery, initial encounter 01/16/2017  . Closed nondisplaced intertrochanteric fracture of left femur with delayed healing 01/14/2017  . Postoperative wound infection 01/14/2017  . Gout 11/29/2016  . Depression 11/29/2016  . Closed left hip fracture ([place]) 11/29/2016  . GERD (gastroesophageal reflux disease) 11/29/2016  . Closed intertrochanteric fracture of hip, left, initial encounter ([place]) 11/29/2016  . Physical deconditioning 07/31/2015  . Pulmonary fibrosis, postinflammatory ([place]) 07/31/2015  . Tendinitis of left rotator cuff 06/13/2015  . Acute on chronic diastolic heart failure ([place]) 04/21/2015  . Acute pulmonary edema (HCC)   . AKI (acute kidney injury) ([place])   . Peripheral edema   . Acute on chronic respiratory failure with hypoxia ([place]) 04/17/2015  . CAP (community acquired pneumonia) 04/17/2015  . Acute kidney injury superimposed on chronic kidney disease ([place]) 04/17/2015  . Hypotension 04/17/2015  . Chronic respiratory failure with hypoxia ([place]) 02/09/2015  . Dyspnea and respiratory abnormality 01/03/2015  . Normal coronary arteries 11/02/2014  . Morbid obesity ([place]) 10/30/2014  . Dysphagia 08/21/2014  . Sore throat 07/18/2014  . Chronic  fatigue 06/21/2014  . DOE (dyspnea on exertion) 05/24/2014  . High risk medication use 04/17/2014  . Aphasia 02/12/2014  . 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 ([place]) 01/18/2014  . Lumbar facet arthropathy ([place]) 01/18/2014  . Acute bronchitis 07/24/2013  . Bilateral edema of lower extremity 05/30/2013  . Chronic cough 05/24/2013  . ILD (interstitial lung disease) ([place]) 04/10/2013  . Greater trochanteric bursitis of left hip 08/13/2012  . Spinal stenosis of lumbar region with radiculopathy 07/12/2012  . Inability to walk 07/12/2012  . Acute lumbar back pain 07/12/2012  . Asymptomatic bacteriuria 07/12/2012  . 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 ([place]) 02/17/2012  . Benign neoplasm of meninges ([place]) 02/12/2012  . Brain mass 01/28/2012  . Chest pain with moderate risk of acute coronary syndrome 01/28/2012  . Rheumatoid arthritis ([place]) 01/28/2012  . Primary osteoarthritis of right knee 01/28/2012  . Dyslipidemia   . Thyroid disease   . Essential hypertension     Orientation RESPIRATION BLADDER Height & Weight     Self, Time, Situation, Place  Normal Continent Weight: 124.3 kg (274 lb) Height:  _0  (162.6 cm)  BEHAVIORAL SYMPTOMS/MOOD NEUROLOGICAL BOWEL NUTRITION STATUS  Other (Comment) (no behaviors)   Continent Diet  AMBULATORY STATUS COMMUNICATION OF NEEDS Skin   Limited Assist Verbally Surgical wounds                       Personal Care Assistance Level of Assistance  Bathing, Feeding, Dressing Bathing Assistance: Limited assistance Feeding assistance: Independent Dressing Assistance: Limited assistance     Functional Limitations Info  Sight, Hearing, Speech Sight Info: Adequate Hearing Info:  Adequate Speech Info: Adequate    SPECIAL CARE FACTORS FREQUENCY  PT (By licensed PT), OT (By licensed OT)     PT Frequency: 5x wk OT Frequency: 5x wk            Contractures Contractures Info: Not present    Additional  Factors Info  Code Status, Allergies, Psychotropic Code Status Info: Full Code Allergies Info: Codeine, Remicade Infliximab, Zestril Lisinopril Psychotropic Info: cymbalta         Current Medications (01/19/2017):  This is the current hospital active medication list Current Facility-Administered Medications  Medication Dose Route Frequency Provider Last Rate Last Dose  . 0.9 %  sodium chloride infusion   Intravenous Continuous [name], MD   Stopped at 01/17/17 1133  . acetaminophen (TYLENOL) tablet 650 mg  650 mg Oral Q6H PRN [name], MD   650 mg at 01/18/17 7628   Or  . acetaminophen (TYLENOL) suppository 650 mg  650 mg Rectal Q6H PRN [name], MD      . albuterol (PROVENTIL) (2.5 MG/3ML) 0.083% nebulizer solution 2.5 mg  2.5 mg Nebulization Q6H PRN [name], MD      . allopurinol (ZYLOPRIM) tablet 300 mg  300 mg Oral Daily [name], MD   300 mg at 01/18/17 0956  . aspirin tablet 325 mg  325 mg Oral BID PC [name], MD   325 mg at 01/18/17 1718  . atorvastatin (LIPITOR) tablet 10 mg  10 mg Oral QPM [name], MD   10 mg at 01/18/17 1719  . azithromycin [facility]) tablet 500 mg  500 mg Oral Q M,W,F [name], MD   500 mg at 01/16/17 2115  . cholecalciferol (VITAMIN D) tablet 2,000 Units  2,000 Units Oral Q lunch [name], MD   2,000 Units at 01/18/17 1153  . cyclobenzaprine (FLEXERIL) tablet 5 mg  5 mg Oral QHS PRN [name], MD      . diphenhydrAMINE (BENADRYL) 12.5 MG/5ML elixir 12.5-25 mg  12.5-25 mg Oral Q4H PRN [name], MD      . docusate sodium (COLACE) capsule 100 mg  100 mg Oral BID [name], MD   100 mg at 01/18/17 2027  . DULoxetine (CYMBALTA) DR capsule 60 mg  60 mg Oral Daily [name], MD   60 mg at 01/18/17 0958  . fluticasone (FLONASE) 50 MCG/ACT nasal spray 2 spray  2 spray Each Nare  Daily [name], MD   2 spray at 01/18/17 0959  . furosemide (LASIX) tablet 80 mg  80 mg Oral Daily [name], MD   80 mg at 01/18/17 0956  . gabapentin (NEURONTIN) capsule 1,200 mg  1,200 mg Oral TID [name], MD   1,200 mg at 01/18/17 2201  . guaiFENesin (MUCINEX) 12 hr tablet 1,200 mg  1,200 mg Oral BID [name], MD   1,200 mg at 01/18/17 2028  . HYDROmorphone (DILAUDID) injection 1 mg  1 mg Intravenous Q2H PRN [name], MD   1 mg at 01/16/17 1825  . hydroxychloroquine (PLAQUENIL) tablet 200 mg  200 mg Oral Daily [name], MD   200 mg at 01/18/17 0956  . insulin aspart (novoLOG) injection 0-9 Units  0-9 Units Subcutaneous TID WC [name], MD   1 Units at 01/18/17 1152  . ipratropium-albuterol (DUONEB) 0.5-2.5 (3) MG/3ML nebulizer solution 3 mL  3 mL Nebulization Q6H  PRN [name], MD      . irbesartan Levy Sjogren) tablet 150 mg  150 mg Oral Daily [name], MD   150 mg at 01/18/17 0954  . iron polysaccharides (NIFEREX) capsule 150 mg  150 mg Oral Daily [name], MD   150 mg at 01/18/17 0955  . levothyroxine (SYNTHROID, LEVOTHROID) tablet 50 mcg  50 mcg Oral QAC breakfast [name], MD   50 mcg at 01/19/17 0521  . liraglutide SOPN 1.8 mg  1.8 mg Subcutaneous Daily [name], MD      . methocarbamol (ROBAXIN) tablet 500 mg  500 mg Oral Q6H PRN [name], MD   500 mg at 01/19/17 8338   Or  . methocarbamol (ROBAXIN) 500 mg in dextrose 5 % 50 mL IVPB  500 mg Intravenous Q6H PRN [name], MD   Stopped at 01/16/17 1716  . metoCLOPramide (REGLAN) tablet 5-10 mg  5-10 mg Oral Q8H PRN [name], MD       Or  . metoCLOPramide (REGLAN) injection 5-10 mg  5-10 mg Intravenous Q8H PRN [name], MD      . metoprolol succinate (TOPROL-XL) 24 hr tablet 50 mg  50 mg Oral q morning - 10a  [name], MD   50 mg at 01/18/17 0957  . morphine (MS CONTIN) 12 hr tablet 15 mg  15 mg Oral Q12H [name], MD   15 mg at 01/18/17 2201  . morphine (MSIR) tablet 15 mg  15 mg Oral Q8H PRN [name], MD   15 mg at 01/17/17 0551  . multivitamin with minerals tablet 1 tablet  1 tablet Oral Daily [name], MD   1 tablet at 01/18/17 [phone]  . ondansetron (ZOFRAN) tablet 4 mg  4 mg Oral Q6H PRN [name], MD       Or  . ondansetron [facility]) injection 4 mg  4 mg Intravenous Q6H PRN [name], MD      . pantoprazole (PROTONIX) EC tablet 40 mg  40 mg Oral Daily [name], MD   40 mg at 01/18/17 0956  . piperacillin-tazobactam (ZOSYN) IVPB 3.375 g  3.375 g Intravenous Q8H [name], MD 12.5 mL/hr at 01/19/17 0521 3.375 g at 01/19/17 0521  . polyethylene glycol (MIRALAX / GLYCOLAX) packet 17 g  17 g Oral Daily [name], MD   17 g at 01/18/17 0951  . potassium chloride SA (K-DUR,KLOR-CON) CR tablet 20 mEq  20 mEq Oral BID [name], MD   20 mEq at 01/18/17 2028  . predniSONE (DELTASONE) tablet 10 mg  10 mg Oral Q breakfast [name], MD   10 mg at 01/18/17 0845  . sodium chloride (OCEAN) 0.65 % nasal spray 1 spray  1 spray Each Nare PRN [name], MD      . vancomycin (VANCOCIN) 1,500 mg in sodium chloride 0.9 % 500 mL IVPB  1,500 mg Intravenous Q24H Eudelia Bunch, [facility]   Stopped at 01/18/17 2245     Discharge Medications: Please see discharge summary for a list of discharge medications.  Relevant Imaging Results:  Relevant Lab Results:   Additional Information SS: [id number]  Kayci [place], Randall An, LCSW

## 2017-01-19 NOTE — Progress Notes (Signed)
CSW met with pt at bedside to assist with dc planning. Pt was recently hospitalized at [facility] with a hip fx and dc to [facility] [place] [facility] on 12/02/16 for ST Rehab. Pt was hospitalized from [facility] [place] on 8/31 due to a left hip wound infection. Please see psychosocial assessment dated 12/02/16. Pt reports that she would like to return to [facility] at dc to continue rehab. SNF contacted and clinicals sent for review. [facility] will admit pt once she is stable for dc. CSW will continue to follow to assist with dc planning.  Werner Lean LCSW [phone]

## 2017-01-19 NOTE — Progress Notes (Signed)
Patient ID: [name], female   DOB: 1940-06-10, 76 y.o.   MRN: [id number] I removed her drain and removed the incisional VAC.  Her left hip incision is dry and there is no cellulitis.  The cultures did grow out rare staph.  Awaiting to see what it is sensitive to in regards to antibiotics.  Will consult Social Work about a return to killed nursing in the next 1-2 days.  Hgb and creatinine improved.  She reports feeling better overall.

## 2017-01-20 ENCOUNTER — Other Ambulatory Visit: Payer: Self-pay | Admitting: *Deleted

## 2017-01-20 LAB — CBC
HEMATOCRIT: 27.8 % — AB (ref 36.0–46.0)
HEMOGLOBIN: 8.4 g/dL — AB (ref 12.0–15.0)
MCH: 27.8 pg (ref 26.0–34.0)
MCHC: 30.2 g/dL (ref 30.0–36.0)
MCV: 92.1 fL (ref 78.0–100.0)
Platelets: 269 10*3/uL (ref 150–400)
RBC: 3.02 MIL/uL — AB (ref 3.87–5.11)
RDW: 16.8 % — ABNORMAL HIGH (ref 11.5–15.5)
WBC: 10.5 10*3/uL (ref 4.0–10.5)

## 2017-01-20 LAB — MRSA PCR SCREENING: MRSA by PCR: NEGATIVE

## 2017-01-20 LAB — CREATININE, SERUM
CREATININE: 1.22 mg/dL — AB (ref 0.44–1.00)
GFR calc non Af Amer: 42 mL/min — ABNORMAL LOW (ref >60)
GFR, EST AFRICAN AMERICAN: 49 mL/min — AB (ref >60)

## 2017-01-20 LAB — GLUCOSE, CAPILLARY
GLUCOSE-CAPILLARY: 137 mg/dL — AB (ref 65–99)
GLUCOSE-CAPILLARY: 204 mg/dL — AB (ref 65–99)
GLUCOSE-CAPILLARY: 170 mg/dL — AB (ref 65–99)
Glucose-Capillary: 95 mg/dL (ref 65–99)

## 2017-01-20 NOTE — Patient Outreach (Signed)
[facility]) Care Management  01/20/2017  [name] 1940/11/14 [id number]   Noted patient had re-admitted to the hospital from facility due to surgical infection.  Chart review reporting MRSA infection. This RNCM will collaborate with [facility] hospital liaison. Royetta Crochet. Laymond Purser, RN, BSN, [place] [phone]) Business Cell  [phone]) Toll Free Office

## 2017-01-20 NOTE — Progress Notes (Signed)
Physical Therapy Treatment Patient Details Name: [name] MRN: [id number] DOB: 14-Dec-1940 Today's Date: 01/20/2017    History of Present Illness pt is s/p L hip I & D due to infection. Had ORIF for comminuted fx originally.  H/O DM    PT Comments    Progressing slowly with mobility. Pt walked into bathroom with NT and performed some ADLS. As pt was walking back from bathroom, she c/o dizziness. Deferred further ambulation and assisted pt into recliner. Sat for several minutes then attempted ambulation a 2nd time. Upon standing, pt c/o dizziness again. Returned pt to recliner. Assessed BP: 111/67 after sitting for several minutes. Encouraged pt to sit up in recliner some on today. Will continue to follow and progress activity.    Follow Up Recommendations  SNF     Equipment Recommendations  None recommended by PT    Recommendations for Other Services       Precautions / Restrictions Precautions Precautions: Fall Restrictions Weight Bearing Restrictions: No Other Position/Activity Restrictions: WBAT    Mobility  Bed Mobility               General bed mobility comments: oob in bathroom with NT  Transfers Overall transfer level: Needs assistance Equipment used: Rolling walker (2 wheeled) Transfers: Sit to/from Stand Sit to Stand: Min guard         General transfer comment: close guard for safety. Increased time.   Ambulation/Gait Ambulation/Gait assistance: Min guard Ambulation Distance (Feet): 15 Feet Assistive device: Rolling walker (2 wheeled) Gait Pattern/deviations: Step-through pattern;Trunk flexed;Decreased stride length     General Gait Details: very close guarding for safety. Pt c/o dizziness as she was returning from bathroom. Deferred further ambulation and assisted pt into recliner.    Stairs            Wheelchair Mobility    Modified Rankin (Stroke Patients Only)       Balance                                             Cognition Arousal/Alertness: Awake/alert Behavior During Therapy: WFL for tasks assessed/performed Overall Cognitive Status: Within Functional Limits for tasks assessed                                        Exercises      General Comments        Pertinent Vitals/Pain Pain Assessment: Faces Faces Pain Scale: Hurts a little bit Pain Location: L LE Pain Descriptors / Indicators: Sore Pain Intervention(s): Limited activity within patient's tolerance;Repositioned    Home Living                      Prior Function            PT Goals (current goals can now be found in the care plan section) Progress towards PT goals: Progressing toward goals    Frequency    Min 3X/week      PT Plan Current plan remains appropriate    Co-evaluation              AM-PAC PT "6 Clicks" Daily Activity  Outcome Measure  Difficulty turning over in bed (including adjusting bedclothes, sheets and blankets)?: A Lot Difficulty moving from lying on back  to sitting on the side of the bed? : A Lot Difficulty sitting down on and standing up from a chair with arms (e.g., wheelchair, bedside commode, etc,.)?: A Little Help needed moving to and from a bed to chair (including a wheelchair)?: A Little Help needed walking in hospital room?: A Little Help needed climbing 3-5 steps with a railing? : A Lot 6 Click Score: 15    End of Session Equipment Utilized During Treatment: Gait belt;Oxygen Activity Tolerance:  (Limited by dizziness) Patient left: in chair;with call bell/phone within reach;with family/visitor present   PT Visit Diagnosis: Muscle weakness (generalized) (M62.81);Difficulty in walking, not elsewhere classified (R26.2)     Time: 4599-7741 PT Time Calculation (min) (ACUTE ONLY): 29 min  Charges:  $Therapeutic Activity: 8-22 mins                    G Codes:          Weston Anna, MPT Pager: [phone]

## 2017-01-20 NOTE — Progress Notes (Signed)
Patient ID: [name], female   DOB: 08-25-1940, 76 y.o.   MRN: [id number] The cultures do show MRSA.  Her dressings are clean and dry.  I would like to continue her on Vancomycin today and tomorrow and then possibly send her to skilled nursing by Thursday on oral Doxycycline.  Will check CBC today.  Will need to place on contact precautions.

## 2017-01-21 DIAGNOSIS — S72142D Displaced intertrochanteric fracture of left femur, subsequent encounter for closed fracture with routine healing: Secondary | ICD-10-CM | POA: Diagnosis not present

## 2017-01-21 DIAGNOSIS — B999 Unspecified infectious disease: Secondary | ICD-10-CM | POA: Diagnosis not present

## 2017-01-21 DIAGNOSIS — J849 Interstitial pulmonary disease, unspecified: Secondary | ICD-10-CM | POA: Diagnosis not present

## 2017-01-21 DIAGNOSIS — E039 Hypothyroidism, unspecified: Secondary | ICD-10-CM | POA: Diagnosis not present

## 2017-01-21 DIAGNOSIS — T814XXD Infection following a procedure, subsequent encounter: Secondary | ICD-10-CM | POA: Diagnosis not present

## 2017-01-21 DIAGNOSIS — S79911A Unspecified injury of right hip, initial encounter: Secondary | ICD-10-CM | POA: Diagnosis not present

## 2017-01-21 DIAGNOSIS — Q828 Other specified congenital malformations of skin: Secondary | ICD-10-CM | POA: Diagnosis not present

## 2017-01-21 DIAGNOSIS — M6281 Muscle weakness (generalized): Secondary | ICD-10-CM | POA: Diagnosis not present

## 2017-01-21 DIAGNOSIS — M79676 Pain in unspecified toe(s): Secondary | ICD-10-CM | POA: Diagnosis not present

## 2017-01-21 DIAGNOSIS — E1140 Type 2 diabetes mellitus with diabetic neuropathy, unspecified: Secondary | ICD-10-CM | POA: Diagnosis not present

## 2017-01-21 DIAGNOSIS — I5033 Acute on chronic diastolic (congestive) heart failure: Secondary | ICD-10-CM | POA: Diagnosis not present

## 2017-01-21 DIAGNOSIS — R41841 Cognitive communication deficit: Secondary | ICD-10-CM | POA: Diagnosis not present

## 2017-01-21 DIAGNOSIS — G8911 Acute pain due to trauma: Secondary | ICD-10-CM | POA: Diagnosis not present

## 2017-01-21 DIAGNOSIS — M109 Gout, unspecified: Secondary | ICD-10-CM | POA: Diagnosis not present

## 2017-01-21 DIAGNOSIS — R2689 Other abnormalities of gait and mobility: Secondary | ICD-10-CM | POA: Diagnosis not present

## 2017-01-21 DIAGNOSIS — I10 Essential (primary) hypertension: Secondary | ICD-10-CM | POA: Diagnosis not present

## 2017-01-21 DIAGNOSIS — F329 Major depressive disorder, single episode, unspecified: Secondary | ICD-10-CM | POA: Diagnosis not present

## 2017-01-21 DIAGNOSIS — R278 Other lack of coordination: Secondary | ICD-10-CM | POA: Diagnosis not present

## 2017-01-21 DIAGNOSIS — T814XXA Infection following a procedure, initial encounter: Secondary | ICD-10-CM | POA: Diagnosis not present

## 2017-01-21 DIAGNOSIS — E785 Hyperlipidemia, unspecified: Secondary | ICD-10-CM | POA: Diagnosis not present

## 2017-01-21 DIAGNOSIS — D62 Acute posthemorrhagic anemia: Secondary | ICD-10-CM | POA: Diagnosis not present

## 2017-01-21 DIAGNOSIS — I5032 Chronic diastolic (congestive) heart failure: Secondary | ICD-10-CM | POA: Diagnosis not present

## 2017-01-21 DIAGNOSIS — M069 Rheumatoid arthritis, unspecified: Secondary | ICD-10-CM | POA: Diagnosis not present

## 2017-01-21 DIAGNOSIS — D500 Iron deficiency anemia secondary to blood loss (chronic): Secondary | ICD-10-CM | POA: Diagnosis not present

## 2017-01-21 DIAGNOSIS — B351 Tinea unguium: Secondary | ICD-10-CM | POA: Diagnosis not present

## 2017-01-21 LAB — CREATININE, SERUM
CREATININE: 1.21 mg/dL — AB (ref 0.44–1.00)
GFR calc Af Amer: 49 mL/min — ABNORMAL LOW (ref >60)
GFR calc non Af Amer: 43 mL/min — ABNORMAL LOW (ref >60)

## 2017-01-21 LAB — CBC
HCT: 28.8 % — ABNORMAL LOW (ref 36.0–46.0)
Hemoglobin: 8.6 g/dL — ABNORMAL LOW (ref 12.0–15.0)
MCH: 27.6 pg (ref 26.0–34.0)
MCHC: 29.9 g/dL — AB (ref 30.0–36.0)
MCV: 92.3 fL (ref 78.0–100.0)
PLATELETS: 275 10*3/uL (ref 150–400)
RBC: 3.12 MIL/uL — AB (ref 3.87–5.11)
RDW: 17 % — AB (ref 11.5–15.5)
WBC: 10.2 10*3/uL (ref 4.0–10.5)

## 2017-01-21 LAB — ANAEROBIC CULTURE

## 2017-01-21 LAB — GLUCOSE, CAPILLARY: GLUCOSE-CAPILLARY: 85 mg/dL (ref 65–99)

## 2017-01-21 MED ORDER — ASPIRIN 325 MG PO TABS: 325.0000 mg | ORAL_TABLET | Freq: Every day | ORAL | 0 refills | Status: DC

## 2017-01-21 MED ORDER — MORPHINE SULFATE ER 15 MG PO TBCR: 15.0000 mg | EXTENDED_RELEASE_TABLET | Freq: Two times a day (BID) | ORAL | 0 refills | Status: DC

## 2017-01-21 MED ORDER — CYCLOBENZAPRINE HCL 5 MG PO TABS: 5.0000 mg | ORAL_TABLET | Freq: Every evening | ORAL | 1 refills | Status: DC | PRN

## 2017-01-21 MED ORDER — MORPHINE SULFATE 15 MG PO TABS: 15.0000 mg | ORAL_TABLET | Freq: Three times a day (TID) | ORAL | 0 refills | Status: DC | PRN

## 2017-01-21 MED ORDER — DOXYCYCLINE HYCLATE 100 MG PO TABS: 100.0000 mg | ORAL_TABLET | Freq: Two times a day (BID) | ORAL | 0 refills | Status: DC

## 2017-01-21 NOTE — Progress Notes (Signed)
Pt is ready to return to [facility] [place] to continue her rehab. Pt / daughter are in agreement with this plan. [facility] transport is required. Medical necessity form completed. DC Summary sent to SNF for review. Scripts included in [employer]. # for report provided.  Werner Lean LCSW [phone]

## 2017-01-21 NOTE — Progress Notes (Signed)
Patient discharged via [facility]. Family present during transfer. Report called to [facility].

## 2017-01-21 NOTE — Discharge Summary (Signed)
Patient ID: [name] MRN: [id number] DOB/AGE: [birth year] 76 y.o.  Admit date: 01/16/2017 Discharge date: 01/21/2017  Admission Diagnoses:  Principal Problem:   Postoperative wound infection Active Problems:   Wound infection after surgery, initial encounter   Discharge Diagnoses:  Same  Past Medical History:  Diagnosis Date  . Acute on chronic diastolic (congestive) heart failure ([place])   . Anemia    iron deficiency anemia - secondary to blood loss ( chronic)   . Arthritis    endstage changes bilateral knees/bilateral ankles.   . Asthma   . Carotid artery occlusion   . Chronic fatigue   . Chronic kidney disease   . Closed left hip fracture ([place])   . Clotting disorder ([place])    pt denies this  . Contusion of left knee    due to fall 1/14.  [name] COPD (chronic obstructive pulmonary disease) (HCC)    pulmonary fibrosis  . Depression, reactive   . Diabetes mellitus    type II   . Diastolic dysfunction   . Difficulty in walking   . Family history of heart disease   . Generalized muscle weakness   . Gout   . High cholesterol   . History of falling   . Hypertension   . Hypothyroidism   . Interstitial lung disease ([place])   . 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    . Morbid obesity ([place])   . Morbid obesity ([place])   . Neuromuscular disorder ([place])    diabetic neuropathy   . Normal coronary arteries    cardiac catheterization performed  10/31/14  . RA (rheumatoid arthritis) ([place])    has been off methotreaxte since 10/13.  [name] Rheumatoid arthritis ([place])   . Shortness of breath   . Spinal stenosis of lumbar region   . Thyroid disease   . Unspecified lack of coordination   . URI (upper respiratory infection)     Surgeries: Procedure(s): IRRIGATION AND DEBRIDEMENT LEFT HIP on 01/16/2017   Consultants:   Discharged Condition: Improved  Hospital Course: [name] is  an 76 y.o. female who was admitted 01/16/2017 for operative treatment ofPostoperative wound infection. Patient has severe unremitting pain that affects sleep, daily activities, and work/hobbies. After pre-op clearance the patient was taken to the operating room on 01/16/2017 and underwent  Procedure(s): IRRIGATION AND DEBRIDEMENT LEFT HIP.    Patient was given perioperative antibiotics: Anti-infectives    Start     Dose/Rate Route Frequency Ordered Stop   01/21/17 0000  doxycycline (VIBRA-TABS) 100 MG tablet     100 mg Oral 2 times daily 01/21/17 0704     01/17/17 2000  vancomycin (VANCOCIN) 1,500 mg in sodium chloride 0.9 % 500 mL IVPB     1,500 mg 250 mL/hr over 120 Minutes Intravenous Every 24 hours 01/16/17 2042     01/16/17 2030  vancomycin (VANCOCIN) 2,500 mg in sodium chloride 0.9 % 500 mL IVPB     2,500 mg 250 mL/hr over 120 Minutes Intravenous  Once 01/16/17 1801 01/16/17 2218   01/16/17 2000  hydroxychloroquine (PLAQUENIL) tablet 200 mg     200 mg Oral Daily 01/16/17 1728     01/16/17 2000  azithromycin (ZITHROMAX) tablet 500 mg    Comments:  TAKE ON MON, WED & FRI     500 mg Oral Every M-W-F 01/16/17 1728     01/16/17 2000  piperacillin-tazobactam (ZOSYN) IVPB 3.375  g     3.375 g 12.5 mL/hr over 240 Minutes Intravenous Every 8 hours 01/16/17 1801     01/16/17 1430  ceFAZolin (ANCEF) 3 g in dextrose 5 % 50 mL IVPB     3 g 130 mL/hr over 30 Minutes Intravenous  Once 01/16/17 1422 01/16/17 1509       Patient was given sequential compression devices, early ambulation, and chemoprophylaxis to prevent DVT.  Patient benefited maximally from hospital stay and there were no complications.    Recent vital signs: Patient Vitals for the past 24 hrs:  BP Temp Temp src Pulse Resp SpO2  01/21/17 0644 130/78 98.6 F (37 C) Oral 89 16 100 %  01/20/17 2117 (!) 111/55 98.4 F (36.9 C) Oral 88 17 100 %  01/20/17 1352 (!) 103/49 98.3 F (36.8 C) Oral 88 16 93 %     Recent laboratory  studies:  Recent Labs  01/19/17 0414 01/20/17 0441 01/20/17 0443 01/21/17 0436  WBC 10.0 10.5  --  10.2  HGB 8.7* 8.4*  --  8.6*  HCT 28.9* 27.8*  --  28.8*  PLT 281 269  --  275  NA 143  --   --   --   K 3.9  --   --   --   CL 104  --   --   --   CO2 33*  --   --   --   BUN 15  --   --   --   CREATININE 1.24*  --  1.22* 1.21*  GLUCOSE 119*  --   --   --   CALCIUM 7.9*  --   --   --      Discharge Medications:   Allergies as of 01/21/2017      Reactions   Codeine Swelling   Facial swelling   Remicade [infliximab] Anaphylaxis   "sent me into shock"   Zestril [lisinopril] Swelling   Face and neck swelling      Medication List    TAKE these medications   acetaminophen 500 MG tablet Commonly known as:  TYLENOL Take 1,000 mg by mouth every 6 (six) hours as needed for moderate pain.   AEROCHAMBER MV inhaler Use as instructed   albuterol 108 (90 Base) MCG/ACT inhaler Commonly known as:  PROVENTIL HFA;VENTOLIN HFA Inhale 2 puffs into the lungs every 6 (six) hours as needed for wheezing or shortness of breath.   allopurinol 300 MG tablet Commonly known as:  ZYLOPRIM Take 300 mg by mouth daily.   aspirin 325 MG tablet Take 1 tablet (325 mg total) by mouth daily. What changed:  when to take this   atorvastatin 20 MG tablet Commonly known as:  LIPITOR Take 10 mg by mouth every evening.   azithromycin 500 MG tablet Commonly known as:  ZITHROMAX Take 1 tablet by mouth daily. TAKE ON MON, WED & FRI   CALCIUM + D3 PO Take 2 tablets by mouth daily with lunch.   cephALEXin 500 MG capsule Commonly known as:  KEFLEX Take 500 mg by mouth 3 (three) times daily.   cholecalciferol 1000 units tablet Commonly known as:  VITAMIN D Take 2,000 Units by mouth daily with lunch.   cyclobenzaprine 5 MG tablet Commonly known as:  FLEXERIL Take 1 tablet (5 mg total) by mouth at bedtime as needed for muscle spasms.   DELSYM COUGH/CHEST CONGEST DM PO Take by mouth. Take 10 mls  by mouth every twelve hours as needed for  cough   docusate sodium 100 MG capsule Commonly known as:  COLACE Take 1 capsule (100 mg total) by mouth 2 (two) times daily.   doxycycline 100 MG tablet Commonly known as:  VIBRA-TABS Take 1 tablet (100 mg total) by mouth 2 (two) times daily.   DULoxetine 60 MG capsule Commonly known as:  CYMBALTA Take 1 capsule (60 mg total) by mouth daily.   fluticasone 50 MCG/ACT nasal spray Commonly known as:  FLONASE Place 2 sprays into both nostrils daily.   furosemide 20 MG tablet Commonly known as:  LASIX Take 80 mg by mouth daily.   gabapentin 600 MG tablet Commonly known as:  NEURONTIN Take two in the morning , Two mid- afternoon and two at bedtime What changed:  how much to take  how to take this  when to take this  additional instructions   guaiFENesin 600 MG 12 hr tablet Commonly known as:  MUCINEX Take 1,200 mg by mouth 2 (two) times daily.   hydroxychloroquine 200 MG tablet Commonly known as:  PLAQUENIL Take 200 mg by mouth daily.   insulin aspart 100 UNIT/ML injection Commonly known as:  novoLOG Inject 0-9 Units into the skin 3 (three) times daily with meals. Sliding scale insulin Less than 70 initiate hypoglycemia protocol 70-120  0 units 120-150 1 unit 151-200 2 units 201-250 3 units 251-300 5 units 301-350 7 units 351-400 9 units  Greater than 400 call MD   ipratropium-albuterol 0.5-2.5 (3) MG/3ML Soln Commonly known as:  DUONEB Take 3 mLs by nebulization. Every 6 hours as needed for cough or wheezing   iron polysaccharides 150 MG capsule Commonly known as:  NIFEREX Take 150 mg by mouth daily.   levothyroxine 50 MCG tablet Commonly known as:  SYNTHROID, LEVOTHROID Take 1 tablet (50 mcg total) by mouth daily.   metoprolol succinate 50 MG 24 hr tablet Commonly known as:  TOPROL-XL Take 1 tablet (50 mg total) by mouth every morning. Take with or immediately following a meal.   morphine 15 MG 12 hr  tablet Commonly known as:  MS CONTIN Take 1 tablet (15 mg total) by mouth every 12 (twelve) hours.   morphine 15 MG tablet Commonly known as:  MSIR Take 1 tablet (15 mg total) by mouth every 8 (eight) hours as needed for severe pain.   multivitamin with minerals Tabs tablet Take 1 tablet by mouth daily.   olmesartan 20 MG tablet Commonly known as:  BENICAR Take 1 tablet (20 mg total) by mouth daily.   OXYGEN Inhale 2 L into the lungs at bedtime. And on exertion   pantoprazole 40 MG tablet Commonly known as:  PROTONIX take 1 tablet by mouth once daily MINUTES BEFORE 1ST MEAL OF THE DAY   polyethylene glycol packet Commonly known as:  MIRALAX / GLYCOLAX Take 17 g by mouth daily.   potassium chloride 10 MEQ tablet Commonly known as:  K-DUR,KLOR-CON Take 20 mEq by mouth 2 (two) times daily.   predniSONE 10 MG tablet Commonly known as:  DELTASONE Take 10 mg by mouth every morning.   sodium chloride 0.65 % Soln nasal spray Commonly known as:  OCEAN Place 1 spray into both nostrils as needed for congestion.   VICTOZA 18 MG/3ML Sopn Generic drug:  liraglutide Inject 1.8 mg as directed daily.            Discharge Care Instructions        Start     Ordered   01/21/17 0000  aspirin 325 MG tablet  Daily     01/21/17 0704   01/21/17 0000  morphine (MS CONTIN) 15 MG 12 hr tablet  Every 12 hours     01/21/17 0704   01/21/17 0000  morphine (MSIR) 15 MG tablet  Every 8 hours PRN     01/21/17 0704   01/21/17 0000  cyclobenzaprine (FLEXERIL) 5 MG tablet  At bedtime PRN     01/21/17 0704   01/21/17 0000  doxycycline (VIBRA-TABS) 100 MG tablet  2 times daily     01/21/17 0704   01/21/17 0000  Discharge patient    Question Answer Comment  Discharge disposition 03-Skilled Nursing Facility   Discharge patient date 01/21/2017      01/21/17 0704   01/21/17 0000  Full weight bearing     01/21/17 9937      Diagnostic Studies: Xr Hip Unilat W Or W/o Pelvis 1v Left  Result  Date: 01/14/2017 X-rays of her pelvis and left hip show interval healing from an intertrochanteric/subtrochanteric proximal left femur fracture or the hardware is intact.   Disposition: 03-Skilled Nursing Facility  Discharge Instructions    Discharge patient    Complete by:  As directed    Discharge disposition:  03-Skilled [place]   Discharge patient date:  01/21/2017   Full weight bearing    Complete by:  As directed          Signed: [name] 01/21/2017, 7:04 AM

## 2017-01-21 NOTE — Discharge Instructions (Signed)
Please leave her current left hip dressing alone and keep it in place until her outpatient follow-up. Attempt full weight bearing as tolerated on her left hip.

## 2017-01-21 NOTE — Care Management Important Message (Signed)
Important Message  Patient Details IM Letter given to Suzanne/Case Manager to present to Patient Name: [name] MRN: [id number] Date of Birth: 27-Mar-1941   Medicare Important Message Given:  Yes    Kerin Salen 01/21/2017, 9:51 AMImportant Message  Patient Details  Name: [name] MRN: [id number] Date of Birth: [birth year]   Medicare Important Message Given:  Yes    Kerin Salen 01/21/2017, 9:51 AM

## 2017-01-21 NOTE — Progress Notes (Signed)
Patient ID: [name], female   DOB: Dec 25, 1940, 76 y.o.   MRN: [id number] Doing well overall.  Incision at left hip clean and dry.  No cellulitis.  Hgb stable. WBC normal.  Can be discharged to skilled nursing today on oral antibiotics.

## 2017-01-22 DIAGNOSIS — M069 Rheumatoid arthritis, unspecified: Secondary | ICD-10-CM | POA: Diagnosis not present

## 2017-01-22 DIAGNOSIS — T814XXD Infection following a procedure, subsequent encounter: Secondary | ICD-10-CM | POA: Diagnosis not present

## 2017-01-22 DIAGNOSIS — J849 Interstitial pulmonary disease, unspecified: Secondary | ICD-10-CM | POA: Diagnosis not present

## 2017-01-22 DIAGNOSIS — S72142D Displaced intertrochanteric fracture of left femur, subsequent encounter for closed fracture with routine healing: Secondary | ICD-10-CM | POA: Diagnosis not present

## 2017-01-26 ENCOUNTER — Encounter: Payer: Medicare Other | Admitting: Physical Medicine & Rehabilitation

## 2017-01-28 DIAGNOSIS — M069 Rheumatoid arthritis, unspecified: Secondary | ICD-10-CM | POA: Diagnosis not present

## 2017-01-28 DIAGNOSIS — E039 Hypothyroidism, unspecified: Secondary | ICD-10-CM | POA: Diagnosis not present

## 2017-01-28 DIAGNOSIS — T814XXA Infection following a procedure, initial encounter: Secondary | ICD-10-CM | POA: Diagnosis not present

## 2017-01-28 DIAGNOSIS — I10 Essential (primary) hypertension: Secondary | ICD-10-CM | POA: Diagnosis not present

## 2017-01-28 DIAGNOSIS — E1140 Type 2 diabetes mellitus with diabetic neuropathy, unspecified: Secondary | ICD-10-CM | POA: Diagnosis not present

## 2017-01-29 DIAGNOSIS — S72142D Displaced intertrochanteric fracture of left femur, subsequent encounter for closed fracture with routine healing: Secondary | ICD-10-CM | POA: Diagnosis not present

## 2017-01-29 DIAGNOSIS — T814XXD Infection following a procedure, subsequent encounter: Secondary | ICD-10-CM | POA: Diagnosis not present

## 2017-01-29 DIAGNOSIS — M069 Rheumatoid arthritis, unspecified: Secondary | ICD-10-CM | POA: Diagnosis not present

## 2017-01-29 DIAGNOSIS — J849 Interstitial pulmonary disease, unspecified: Secondary | ICD-10-CM | POA: Diagnosis not present

## 2017-02-02 ENCOUNTER — Ambulatory Visit (INDEPENDENT_AMBULATORY_CARE_PROVIDER_SITE_OTHER): Payer: Medicare Other | Admitting: Orthopaedic Surgery

## 2017-02-02 DIAGNOSIS — IMO0001 Reserved for inherently not codable concepts without codable children: Secondary | ICD-10-CM

## 2017-02-02 DIAGNOSIS — T814XXD Infection following a procedure, subsequent encounter: Secondary | ICD-10-CM

## 2017-02-02 DIAGNOSIS — S72142A Displaced intertrochanteric fracture of left femur, initial encounter for closed fracture: Secondary | ICD-10-CM

## 2017-02-02 NOTE — Progress Notes (Signed)
The patient is 2 weeks status post irrigation debridement of the left hip postoperative infection. She is morbidly obese individual his diabetic. This did Grubbs sensitive staph. She is doing well overall to the draining has gone away at this point.  On examination of her left hip incision is dry completely. There is no wounds or drainage and no redness or cellulitis.  Removed the sutures and place Steri-Strips. I will continue her doxycycline for 2 more weeks. I will like to see her back in 2 weeks and I will like an AP and lateral of her left hip at that visit. I do not need to see the pelvis and I do not need to see the left knee just an AP and lateral of the left hip.

## 2017-02-05 DIAGNOSIS — S72142D Displaced intertrochanteric fracture of left femur, subsequent encounter for closed fracture with routine healing: Secondary | ICD-10-CM | POA: Diagnosis not present

## 2017-02-05 DIAGNOSIS — M069 Rheumatoid arthritis, unspecified: Secondary | ICD-10-CM | POA: Diagnosis not present

## 2017-02-05 DIAGNOSIS — I5032 Chronic diastolic (congestive) heart failure: Secondary | ICD-10-CM | POA: Diagnosis not present

## 2017-02-05 DIAGNOSIS — T814XXD Infection following a procedure, subsequent encounter: Secondary | ICD-10-CM | POA: Diagnosis not present

## 2017-02-06 DIAGNOSIS — I5032 Chronic diastolic (congestive) heart failure: Secondary | ICD-10-CM | POA: Diagnosis not present

## 2017-02-06 DIAGNOSIS — T814XXD Infection following a procedure, subsequent encounter: Secondary | ICD-10-CM | POA: Diagnosis not present

## 2017-02-06 DIAGNOSIS — D62 Acute posthemorrhagic anemia: Secondary | ICD-10-CM | POA: Diagnosis not present

## 2017-02-06 DIAGNOSIS — I10 Essential (primary) hypertension: Secondary | ICD-10-CM | POA: Diagnosis not present

## 2017-02-10 ENCOUNTER — Ambulatory Visit (INDEPENDENT_AMBULATORY_CARE_PROVIDER_SITE_OTHER): Payer: Medicare Other | Admitting: Podiatry

## 2017-02-10 DIAGNOSIS — M79676 Pain in unspecified toe(s): Secondary | ICD-10-CM

## 2017-02-10 DIAGNOSIS — B351 Tinea unguium: Secondary | ICD-10-CM

## 2017-02-10 DIAGNOSIS — Q828 Other specified congenital malformations of skin: Secondary | ICD-10-CM | POA: Diagnosis not present

## 2017-02-10 NOTE — Progress Notes (Addendum)
This patient presents the office for preventative foot care services. She says her nails have become thick and long are painful walking and wearing her shoes. She also says that she is experiencing pain from the callus on the outside of her left forefoot. She also says that she is here to be casted for her new diabetic shoes.    GENERAL APPEARANCE: Alert, conversant. Appropriately groomed. No acute distress.  VASCULAR: Pedal pulses are  palpable at  [facility] and  bilateral. Her PT pulses are non palpable secondary to her severe swelling. Capillary refill time is immediate to all digits,  Normal temperature gradient.  Venous stasis feet  B/L. NEUROLOGIC: sensation is diminished  to 5.07 monofilament at 5/5 sites bilateral.  Light touch is intact bilateral, Muscle strength normal.  MUSCULOSKELETAL: acceptable muscle strength, tone and stability bilateral.  HAV  B/L.  DERMATOLOGIC: skin color, texture, and turgor are within normal limits.  No preulcerative lesions or ulcers  are seen, no interdigital maceration noted.  No open lesions present.   No drainage noted. Porokeratosis sub 5th met left foot.   NAILS  Thick disfigured discolored nails both feet.  Diagnosis  Onychomycosis    Diabeties with neuropathy.  IE  debridement of the mycotic  Return to the office in 3 months for continued nail care at that visit.  She did not receive shoes today since her medical doctor never signed the paperwork.   Gardiner Barefoot DPM

## 2017-02-12 DIAGNOSIS — M069 Rheumatoid arthritis, unspecified: Secondary | ICD-10-CM | POA: Diagnosis not present

## 2017-02-12 DIAGNOSIS — T814XXD Infection following a procedure, subsequent encounter: Secondary | ICD-10-CM | POA: Diagnosis not present

## 2017-02-12 DIAGNOSIS — I5032 Chronic diastolic (congestive) heart failure: Secondary | ICD-10-CM | POA: Diagnosis not present

## 2017-02-12 DIAGNOSIS — S72142D Displaced intertrochanteric fracture of left femur, subsequent encounter for closed fracture with routine healing: Secondary | ICD-10-CM | POA: Diagnosis not present

## 2017-02-13 ENCOUNTER — Other Ambulatory Visit: Payer: Self-pay | Admitting: *Deleted

## 2017-02-13 DIAGNOSIS — I5022 Chronic systolic (congestive) heart failure: Secondary | ICD-10-CM

## 2017-02-13 NOTE — Patient Outreach (Signed)
[place] [facility]) Care Management  02/13/2017  Ama GLENDIA OLSHEFSKI [birth year] [id number]   Met with Vickii Chafe, SW at facility.  She reports patient discharge home today with [facility] home [facility].  She was doing very well upon discharge.  Plan to refer to [place] for TOC. RNCM had met with patient early in her rehab stay and she agreed that [facility] care management services would benefit her with her health issues.   Patient has [facility] information, verbal consent was given in a past visit with patient.  Royetta Crochet. Laymond Purser, RN, BSN, [place] [phone]) Business Cell  [phone]) Toll Free Office

## 2017-02-16 ENCOUNTER — Other Ambulatory Visit: Payer: Self-pay | Admitting: *Deleted

## 2017-02-16 ENCOUNTER — Encounter: Payer: Self-pay | Admitting: *Deleted

## 2017-02-16 ENCOUNTER — Encounter: Payer: Self-pay | Admitting: Internal Medicine

## 2017-02-16 ENCOUNTER — Ambulatory Visit (INDEPENDENT_AMBULATORY_CARE_PROVIDER_SITE_OTHER): Payer: Medicare Other | Admitting: Internal Medicine

## 2017-02-16 VITALS — BP 122/82 | HR 104 | Ht 64.0 in | Wt 263.2 lb

## 2017-02-16 DIAGNOSIS — J849 Interstitial pulmonary disease, unspecified: Secondary | ICD-10-CM | POA: Diagnosis not present

## 2017-02-16 DIAGNOSIS — Z79899 Other long term (current) drug therapy: Secondary | ICD-10-CM

## 2017-02-16 DIAGNOSIS — J479 Bronchiectasis, uncomplicated: Secondary | ICD-10-CM | POA: Diagnosis not present

## 2017-02-16 DIAGNOSIS — Z23 Encounter for immunization: Secondary | ICD-10-CM

## 2017-02-16 NOTE — Patient Outreach (Signed)
[facility] [facility]) Care Management [place] Telephone Outreach, Transition of Care attempt # 1  02/16/2017  [name] June 30, 1940 [id number]  10:05 am:  Unsuccessful telephone outreach to [facility], 77 y/o female referred to [place] after recent hospitalization August 31-January 21, 2017 for (L) hip fracture after fall; Patient was discharged to SNF for rehabilitation and was discharged from [facility] visit on February 13, 2017 with home health [facility]) services through [place].  Patient has history including, but not limited to, essential HTN, HLD, DM Type II, rheumatoid arthritis, depression, CHF, COPD with chronic respiratory failure, morbid obesity, and GERD.  At the time of initial attempt, patient's phone range without physical or voicemail pick up; unable to leave patient HIPAA compliant voicemail, requesting call back.  Plan:    Will re-attempt telephone outreach to patient later this afternoon.  2:40 pm:  Attempted second outreach for transition of care call attempt #1; phone number listed as patient's home number rang without physical or voicemail pickup; therefore, I attempted another call to number listed as patient's cell phone.  HIPAA compliant voice mail message left for patient on number listed as cell number, requesting return call back.  Plan:  Will re-attempt [place] telephone outreach again tomorrow if I do not hear back from patient first.  Oneta Rack, RN, BSN, [place] Coordinator [facility] Care Management  [phone]

## 2017-02-16 NOTE — Patient Outreach (Signed)
[facility] [facility]) Care Management [place] Telephone Outreach, Transition of Care day 1  02/16/2017  [name] [birth year] [id number]  Successful incoming telephone outreach from [facility], 76 y/o female referred to [place] after recent hospitalization August 31-January 21, 2017 for (L) hip fracture after fall; Patient was discharged to [facility] for rehabilitation and was discharged from [facility] visit on February 13, 2017 with home health [facility]) services through [place].  Patient has history including, but not limited to, essential HTN, HLD, DM Type II, rheumatoid arthritis, depression, CHF, COPD with chronic respiratory failure, morbid obesity, and GERD.  HIPAA/ identity verified with patient during phone call today, and patient provides verbal consent for [facility] CM involvement in her care.  Today, patient reports that she "is doing pretty good," stating that she "is doing better" than she thought would post- SNF discharge.  Patient reports pain in her (L) leg/ hip after her recent hip replacement at "4/10" but states that her pain medication helps to manage her pain "very well."  Patient reports today that her home phone number is not working but "should be fixed by tomorrow."  Patient further reports:  Medications: -- Has all medicationsand takes as prescribed;denies questions about current medications; declines medication reconciliation by phone today, stating that when she talks a lot, it makes her "winded," and agrees to medication reconciliation during [place] home visit  -- self-manage medications using bottles; reports has previously tried pill planner box, and was unable to use, as it caused her confusion around her medications, and was difficult with her artritis. -- denies issues with swallowing medications  Home health [facility]) services: -- [place] services for PT, OT, RN in place through [facility]; reports that she has the phone number  for agency -- reports [facility] services have not yet begun; states that she believes her phone troubles have prevented [facility] agency from successfully contacting her -- encouraged patient to contact [facility] agency, and offered to facilitate this; patient declined offer, stating that she would prefer to call them herself; reports she will call agency tomorrow if she has not heard from them by then  Provider appointments: -- All upcoming provider appointments were reviewed with patient today -- Attended pulmonary provider appointment post-SNF discharge this morning; states office visit "went well," and she denies changes to medications made during office visit today -- Verbalizes plans to attend orthopedic surgeon provider appointment 02/18/17 -- States family will transport her to all provider appointments -- Encouraged patient to schedule PCP appointment post- SNF discharge; patient reports that she is planning to call/ schedule this appointment tomorrow   Safety/ Mobility/ Falls: -- denies new/ recent falls post-SNF discharge -- assistive devices: currently using walker -- general fall risks/ prevention education discussed with patient today  Social/ Community Resource needs: -- currently denies community resource needs, stating supportive family members that assist with care needs as indicated and transport patient to provider appointments  Advanced Directive (AD) Planning:   --reports currently has exisisting AD in place for Living Will and HCPOA, denies desire to make changes at this time.    Self-health management of CHF, COPD/ ILD: -- states that she is "winded" with any exertion; reports difficult for her to talk for long periods of time, as this increases her shortness of breath -- uses home O2 at 2 L/min via [place] "at night" and "whenever I need it during daytime" hours; reports has used during daytime hours 2-3 times post-SNF discharge --  has been on home O2 "for 3 or 4 years now" -- currently does  not monitor/ record daily weights; reports that she tries to follow "heart healthy, low salt, diabetic" diet  Patient denies further issues, concerns, or problems today, and we scheduled [facility] RN CCM initial home visit for next week.  I provided/ confirmed that patient has my direct phone number, the main [facility] CM office phone number, and the [facility] CM 24-hour nurse advice phone number should issues arise prior to next scheduled [place] outreach.  Plan:  Patient will take medications as prescribed and will attend all provider appointments  Patient will promptly notify her providers of any new concerns, issues or problems that arise  Patient will actively participate in home health services as ordered post-SNF discharge  Patient will schedule post-SNF discharge PCP appointment promptly  I will make patient's PCP provider aware of [facility] RN CCM involvement in patient's care post- SNF discharge  [place] outreach to continue with scheduled [facility] RN CCM initial home visit next week  Oneta Rack, RN, BSN, [place] Care Management  [phone]

## 2017-02-16 NOTE — Patient Instructions (Signed)
ICD-10-CM   1. ILD (interstitial lung disease) ([place]) J84.9   2. High risk medication use Z79.899   3. Bronchiectasis without complication ([place]) D53.2     Congratulations on weight loss  Plan  continue medicines via [place] weight loss Continue PT for hip and back High dose flu shot 02/16/2017 Please talk to PCP Glendale Chard, MD -  and ensure you discuss new shingarix vaccine  Followup 6 months or sooner if needed

## 2017-02-16 NOTE — Progress Notes (Signed)
Subjective:     Patient ID: [name], female   DOB: 11/17/1940, 76 y.o.   MRN: [id number]  HPI  OV 01/03/2015  Chief Complaint  Patient presents with  . Follow-up    Pt c/o DOE, resolving cough that started over the weekend, chest discomfort when SOB and when lying down. Pt c/o increasing fatigue.    She has severe class 3 dyspnea with hypoxemia on account of BMI 50, diaast dsyfn, chronic pain, deconditioning and ILD due to RA on celcept/pred/bactrim   I personally last saw her in march 2-016. Since then has seen other provicers in this office and most recently in June 2016. In June 2016 admitted for acute CHF - cath showed mild pulm htn only with PA mean 24 and minimal CAD (reviewed chart of procedures June 2016). Dx as acute diast chf. Since then better. SHe had fu HRCT 11/29/14 that shows progression in ILD since march 2016 and now with UIP pattern (personally visualized image). She is finally accepting of fact that ILD is not makin cause of dyspnea and obesity is main problem. She is wondering about dc cellcept/pred/bactrim so as to minimize polypharmacy and side effect profile    OV 02/09/2015  Chief Complaint  Patient presents with  . Follow-up    Pt states her breathing is doing well the past few days. Pt recently went to  [facility] ED for CP. Pt c/o prod cough with tan mucus. Pt denies CP/tightness    She has severe class 3 dyspnea with hypoxemia on account of BMI 50, diaast dsyfn, chronic pain, deconditioning and ILD due to RA. She is having progressive ILD. But given her miserable quality of life and polypharmacy and other side effects we decided to stop the CellCept and Bactrim. We also advise prednisone taper. This was all in the end of August 2016. But on 01/27/2015 she ended up in the emergency room with some respiratory difficulty. Her prednisone was bumped up. She subsequently saw her rheumatologist [name] was now tapering her prednisone again. There is also some concerns of  headaches and possibly temporal arteritis based on her description and apparently some kind of a biopsy scheduled. She feels that since stopping her CellCept and Bactrim her cough is returned. Nevertheless weight and knee pain and back pain continued to be major issues for her along with polypharmacy. Her Polysorb was includes MS Contin  [name]    OV 02/21/2015  - a ACUTE VISIT  Chief Complaint  Patient presents with  . Acute Visit    Pt c/o of chest tightness, productive cough with white/yellow mucus, and wheezing. Pt also c/o of feeling abdominal swelling/distention. Pt also c/o of constant fatigue. Pt states symptoms have been present x1 week.     Acute visit for this morbidly obese, rheumatoid arthritis patient with ILD who is now off CellCept  I saw her as recently as 02/09/2015. She reports that for the past 1 week she's having increased chest congestion, chest tightness, wheezing, shortness of breath, cough and yellow  sputum more than baseline. Symptoms are rated as moderate in severity. Relieved by inhaler. She has tried Mucinex for 1 week with some initial partial relief but symptoms progressed. So she made an acute visit.  She continues to stay off CellCept. She is not on prednisone 10 mg per day. She tells me [name] has held her prednisone at the dose because of concerns of temporal arteritis but apparently now that his  diagnoses being eliminated in the differential diagnosis. She will see [name] in the next few weeks to decide on further prednisone taper.    OV 04/16/2015  Chief Complaint  Patient presents with  . Follow-up    Pt states she felt much better after completing the abx and pred. Pt states her SOB is at basline, prod cough with yellow mucus at times, pt c/o chest tightness.     Morbidly obese female with rheumatoid arthritis with interstitial lung disease UIP pattern with associated deconditioning and diastolic dysfunction  She  continues to remain off CellCept. Last seen in October 2016. At that time I discharged her from the office with doxycycline and prednisone taper. After that early November 2016 she was given another course of Doxy and prednisone burst. This did not help. Sputum culture at this time showed normal flora. She called again with the middle of November was given Levaquin and prednisone burst. She feels Levaquin helped her immensely. At this point in time she is reporting progressive dyspnea and also worsening arthritis. She wants to go back on immunomodulators again. She has discussed Imuran with [name]. She is due to see [name] tomorrow. She also feels that she is unable to come off prednisone. She feels at a minimum she needs 10 mg of prednisone per day. She will discuss this with Dr. D her rheumatologist tomorrow. She's also interested in a second opinion at [facility] interstitial lung disease clinic which I did discuss with her most recently. She continues to use oxygen.     OV 07/17/2015  Chief Complaint  Patient presents with  . Follow-up    Pt states that she has improved. Pt c/o continued SOB with exertion, some ocugh and wheeze, and some intermittent chest tightness. Pt states that she does sleep a lot. Pt states that her heart rate remains high and that cardiology said it was due to her lungs. Pt is currently on Lasix, 33m, but still seems to retain a lot of fluid and is not seeming to go to the bathroom as frequently.    Follow-up interstitial lung disease secondary to rheumatoid arthritis in the setting of morbid obesity, poor quality of life and heavy chronic pain opioid use and depressive symptoms  Last seen November 2016. Since then she has seen [name] rheumatology clinic. She was given instructions on Imuran which she has red but she has not decided whether she should take it or not. She subsequently has followed up with [facility] interstitial lung disease clinic. CT scan of the chest reviewed result  shows possible UIP pattern 06/12/2015. She had autoimmune profile which was strongly positive for both rheumatoid factor and cyclic citrulline peptide both markers for rheumatoid arthritis. She did see Dr. CMargreta JourneyB on 07/02/2015. It appears that the note is not complete but according to the patient was supposed to be a multidisciplinary conference and they will get back to her. It is possible a second immunomodulators agent and is being contemplated but patient is leery of this given prior issues with opportunistic infection not otherwise specified and admissions to the hospital. In the interim she is also seen cardiology Janr 2017 Dr. BGwenlyn Foundwho did not feel dyspnea was cardiac related. I reviewed his note  OV 10/17/2015  Chief Complaint  Patient presents with  . Follow-up    Pt reports she feels improved. Pt c/o continued SOB with exertion, some wheeze and occasional cough. Pt denies CP/tightness.     Follow-up interstitial lung  disease secondary to rheumatoid arthritis in the setting of morbid obesity, poor quality of life and heavy chronic pain opioid use and depressive symptoms   Since seeing me last in February 2017. She followed up at [facility] again pulmonary clinic in March 2017. I reviewed those notes. She saw Dr. Margreta Journey B on 07/31/2015. Due to the presence of bronchiectasis she's been started on  azithromycin 3 times a week. She says this has helped. In addition due to physical deconditioning and obesity she is getting home physical therapy is also help. She is interested in pulmonary rehabilitation although given obesity and back pain can be challenging. She nevertheless wants to try. She is on daily prednisone 10 mg a day. She met with [name] in the rheumatology clinic and has been referred now to [facility] rheumatology clinic for second opinion for a second immunomodulators. She continues to be morbidly obese and has been losing weight. She is somewhat interested in visiting with a  [facility] for [facility].   OV 02/18/2016  Chief Complaint  Patient presents with  . Follow-up    Pt states she did not go to pulm rehab d/t BIL knee pain. Pt denies change in SOB, significant cough, CP/tightness and f/c/s. Pt states she is exercising at home. pt states she is doing well overall.    Problem List: 1. Extensive bronchiectasis - cylindrical vs. Traction - has been present and not significantly changed from 2014 to 2017 - has history of hospitalization for what sounds like pneumonia vs. Bronchiectasis flare as far back as 1999; underwent bronchoscopy which made her feel "like a new woman" in 1999 2. Pulmonary fibrosis - basilar predominant reticular opacities that have not change from 2014 to 2017 3. Rheumatoid arthritis: RF 959 ; CCP >300 - has been treated with methotrexate, biologics, etc in the past - none in the past several years - currently on 7.5/67m alter day 4. Morbid obesity with deconditioning - has had several hospitalizations in recent months; very deconditioned at present time  - [facility]   Followup for above  - [facility]. [facility] Review of the notes suggest that they have her on a slightly lower dose of prednisone with intent to taper to 5 mg per day. He also have her on hydroxychloroquine. No further disease modifying agents are being considered for rheumatoid arthritis. In terms of her interstitial lung disease and bronchiectasis she's on conservative therapy along with Activella device. This is helping and she feels better. Today she came in walking with her walker. This the happiest I have f seen her. She's not lost any weight but [facility] She tried to go to pulmonary rehabilitation  but could not because of knee pain. She is up-to-date with pneumonia vaccine. She's had a flu shot this season.    OV 03/18/2016  Chief Complaint  Patient presents with  . Acute Visit    Pt states breathing is not that bad today. Pt states she has cogestion build up, no tightness, SOB w/o excertion. Pt states breathing has been better since last visit except the mucus. No cough feels the need to cough but can not.      follow-up bronchiectasis and immunosppessive thrapy. Visit 3 month olow-up. Body mssindex is stll very high but she says she has lost weight. Overal well. Last few days  increasd symptoms of mucus, cough and subjective unwellness. No fever or colored sputum. But she feells she needs antibiotic and prednisone  OV 06/23/2016  Chief Complaint  Patient presents with  . Follow-up    Pt states her SOB has been doing well but she has been more fatigued. Pt has an occ prod cough with light yellow mucus and chest tightness when SOB - resolves with rest.    Follow-up bronchiectasis with interstitial lung disease on immunosuppressive therapy due to rheumatoid arthritis. Last visit 01/17/2016. Since then she's lost 5 pounds of weight according to history. . She is here with her daughter. No acute bronchitis symptoms. Overall stable. The main issues that she's having increased joint pain and she is only on plaquenil. No fever or colored sputum.   OV 02/16/2017  Chief Complaint  Patient presents with  . Follow-up    Pt states breathing has been okay. Stated she had a URI x1 month ago which she developed while in rehab after hip fx. Occ. SOB and occ. chest tightness. Denies any cough at this time.    Follow-up bronchiectasis with interstitial lung disease on immunosuppressive therapy due to rheumatoid arthritis. Last visit was 6 months ago approximately. Since then she has continued to lose weight through dietary control. She feels better. However in the summer 2018 she fell down due to  orthostasis and fractured her left hip. This wasn't complicated by MRSA infection according to her history. She is now undergoing physical therapy. Overall she's feeling well. She is very proud of her weight loss. Dyspnea is improved. She missed [facility] appointment for ILD clinic but she will make this again. She uses oxygen with heavy exertion at physical therapy and at night.   has a past medical history of Acute on chronic diastolic (congestive) heart failure ([place]); Anemia; Arthritis; Asthma; Carotid artery occlusion; Chronic fatigue; Chronic kidney disease; Closed left hip fracture ([place]); Clotting disorder ([place]); Contusion of left knee; COPD (chronic obstructive pulmonary disease) ([place]); Depression, reactive; Diabetes mellitus; Diastolic dysfunction; Difficulty in walking; Family history of heart disease; Generalized muscle weakness; Gout; High cholesterol; History of falling; Hypertension; Hypothyroidism; Interstitial lung disease ([place]); 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 ([place]) (02/17/2012); Morbid obesity ([place]); Morbid obesity ([place]); Neuromuscular disorder ([place]); Normal coronary arteries; RA (rheumatoid arthritis) ([place]); Rheumatoid arthritis ([place]); Shortness of breath; Spinal stenosis of lumbar region; Thyroid disease; Unspecified lack of coordination; and URI (upper respiratory infection).   reports that she has never smoked. She has never used smokeless tobacco.  Past Surgical History:  Procedure Laterality Date  . ABDOMINAL HYSTERECTOMY    . BRAIN SURGERY     Gamma knife 10/13. Needs repeat spring  '14  . CARDIAC CATHETERIZATION N/A 10/31/2014   Procedure: Right/Left Heart Cath and Coronary Angiography;  Surgeon: [name], MD;  Location: [facility];  Service: Cardiovascular;  Laterality: N/A;  . ESOPHAGOGASTRODUODENOSCOPY (EGD) WITH PROPOFOL N/A 09/14/2014   Procedure: ESOPHAGOGASTRODUODENOSCOPY (EGD) WITH PROPOFOL;  Surgeon: [name], MD;   Location: [facility];  Service: Endoscopy;  Laterality: N/A;  . INCISION AND DRAINAGE HIP Left 01/16/2017   Procedure: IRRIGATION AND DEBRIDEMENT LEFT HIP;  Surgeon: [name], MD;  Location: [facility];  Service: Orthopedics;  Laterality: Left;  . INTRAMEDULLARY (IM) NAIL INTERTROCHANTERIC Left 11/29/2016   Procedure: INTRAMEDULLARY (IM) NAIL INTERTROCHANTRIC;  Surgeon: [name], MD;  Location: [facility];  Service: Orthopedics;  Laterality: Left;  . OVARY SURGERY    . SHOULDER SURGERY  Left   . TONSILLECTOMY  age 3  . VIDEO BRONCHOSCOPY Bilateral 05/31/2013   Procedure: VIDEO BRONCHOSCOPY WITHOUT FLUORO;  Surgeon: Brand Males, MD;  Location: [facility];  Service: Cardiopulmonary;  Laterality: Bilateral;  . video bronscoscopy  2000   lung    Allergies  Allergen Reactions  . Codeine Swelling    Facial swelling  . Remicade [Infliximab] Anaphylaxis    "sent me into shock"  . Zestril [Lisinopril] Swelling    Face and neck swelling    Immunization History  Administered Date(s) Administered  . Influenza Split 01/29/2012, 03/30/2013, 02/10/2014  . Influenza,inj,Quad PF,6+ Mos 02/09/2015, 12/18/2015  . Influenza-Unspecified 01/30/2015  . PPD Test 04/23/2015  . Pneumococcal Conjugate-13 08/21/2014  . Pneumococcal Polysaccharide-23 02/17/2012  . Tdap 12/27/2015    Family History  Problem Relation Age of Onset  . Diabetes Mother   . Heart attack Mother   . Hypertension Father   . Lung cancer Father   . Diabetes Sister   . Diabetes Brother   . Hypertension Brother   . Heart disease Brother   . Heart attack Brother   . Kidney cancer Brother   . Uterine cancer Daughter   . Breast cancer Sister   . Rheum arthritis Maternal Uncle   . Gout Brother   . Kidney failure Brother        x 5     Current Outpatient Prescriptions:  .  acetaminophen (TYLENOL) 500 MG tablet, Take 1,000 mg by mouth every 6 (six) hours as needed for moderate pain. , Disp: , Rfl:   .  albuterol (PROVENTIL HFA;VENTOLIN HFA) 108 (90 BASE) MCG/ACT inhaler, Inhale 2 puffs into the lungs every 6 (six) hours as needed for wheezing or shortness of breath., Disp: , Rfl:  .  aspirin 325 MG tablet, Take 1 tablet (325 mg total) by mouth daily., Disp: 60 tablet, Rfl: 0 .  atorvastatin (LIPITOR) 20 MG tablet, Take 10 mg by mouth every evening. , Disp: , Rfl:  .  azithromycin (ZITHROMAX) 500 MG tablet, Take 1 tablet by mouth daily. TAKE ON MON, WED & FRI, Disp: , Rfl: 0 .  Calcium Carb-Cholecalciferol (CALCIUM + D3 PO), Take 2 tablets by mouth daily with lunch. , Disp: , Rfl:  .  cholecalciferol (VITAMIN D) 1000 UNITS tablet, Take 2,000 Units by mouth daily with lunch. , Disp: , Rfl:  .  cyclobenzaprine (FLEXERIL) 5 MG tablet, Take 1 tablet (5 mg total) by mouth at bedtime as needed for muscle spasms., Disp: 30 tablet, Rfl: 1 .  docusate sodium (COLACE) 100 MG capsule, Take 1 capsule (100 mg total) by mouth 2 (two) times daily., Disp: 10 capsule, Rfl: 0 .  DULoxetine (CYMBALTA) 60 MG capsule, Take 1 capsule (60 mg total) by mouth daily., Disp: 30 capsule, Rfl: 4 .  fluticasone (FLONASE) 50 MCG/ACT nasal spray, Place 2 sprays into both nostrils daily., Disp: 16 g, Rfl: 3 .  furosemide (LASIX) 20 MG tablet, Take 80 mg by mouth daily. , Disp: , Rfl: 1 .  gabapentin (NEURONTIN) 600 MG tablet, Take two in the morning , Two mid- afternoon and two at bedtime (Patient taking differently: Take 1,200 mg by mouth 3 (three) times daily. ), Disp: 540 tablet, Rfl: 0 .  guaiFENesin (MUCINEX) 600 MG 12 hr tablet, Take 1,200 mg by mouth 2 (two) times daily., Disp: , Rfl:  .  hydroxychloroquine (PLAQUENIL) 200 MG tablet, Take 200 mg by mouth daily., Disp: , Rfl:  .  ipratropium-albuterol (DUONEB) 0.5-2.5 (  3) MG/3ML SOLN, Take 3 mLs by nebulization. Every 6 hours as needed for cough or wheezing, Disp: , Rfl:  .  iron polysaccharides (NIFEREX) 150 MG capsule, Take 150 mg by mouth daily., Disp: , Rfl:  .   levothyroxine (SYNTHROID, LEVOTHROID) 50 MCG tablet, Take 1 tablet (50 mcg total) by mouth daily., Disp: 30 tablet, Rfl: 1 .  metoprolol succinate (TOPROL-XL) 50 MG 24 hr tablet, Take 1 tablet (50 mg total) by mouth every morning. Take with or immediately following a meal., Disp: , Rfl:  .  morphine (MS CONTIN) 15 MG 12 hr tablet, Take 1 tablet (15 mg total) by mouth every 12 (twelve) hours., Disp: 60 tablet, Rfl: 0 .  morphine (MSIR) 15 MG tablet, Take 1 tablet (15 mg total) by mouth every 8 (eight) hours as needed for severe pain., Disp: 30 tablet, Rfl: 0 .  Multiple Vitamin (MULTIVITAMIN WITH MINERALS) TABS, Take 1 tablet by mouth daily. , Disp: , Rfl:  .  olmesartan (BENICAR) 20 MG tablet, Take 1 tablet (20 mg total) by mouth daily., Disp: , Rfl:  .  OXYGEN-HELIUM IN, Inhale 2 L into the lungs at bedtime. And on exertion, Disp: , Rfl:  .  pantoprazole (PROTONIX) 40 MG tablet, take 1 tablet by mouth once daily MINUTES BEFORE 1ST MEAL OF THE DAY, Disp: 30 tablet, Rfl: 3 .  polyethylene glycol (MIRALAX / GLYCOLAX) packet, Take 17 g by mouth daily., Disp: 14 each, Rfl: 0 .  potassium chloride (K-DUR,KLOR-CON) 10 MEQ tablet, Take 20 mEq by mouth 2 (two) times daily., Disp: , Rfl:  .  predniSONE (DELTASONE) 10 MG tablet, Take 10 mg by mouth every morning. , Disp: , Rfl: 0 .  sodium chloride (OCEAN) 0.65 % SOLN nasal spray, Place 1 spray into both nostrils as needed for congestion., Disp: , Rfl:  .  Spacer/Aero-Holding Chambers (AEROCHAMBER MV) inhaler, Use as instructed, Disp: 1 each, Rfl: 0 .  VICTOZA 18 MG/3ML SOPN, Inject 1.8 mg as directed daily. , Disp: , Rfl: 0 .  allopurinol (ZYLOPRIM) 300 MG tablet, Take 300 mg by mouth daily. , Disp: , Rfl:    Review of Systems     Objective:   Physical Exam Vitals:   02/16/17 1219  BP: 122/82  Pulse: (!) 104  SpO2: 94%  Weight: 263 lb 3.2 oz (119.4 kg)  Height: 5' 4" (1.626 m)      Assessment:       ICD-10-CM   1. ILD (interstitial lung  disease) ([place]) J84.9   2. High risk medication use Z79.899   3. Bronchiectasis without complication ([place]) Q33.0   Obese female sitting with a walker Crackles in the lung base Looks cheerful Chronic edema Alert and oriented 3 Heart sounds     Plan:      Congratulations on weight loss  Plan  continue medicines via [facility] weight loss Continue PT for hip and back High dose flu shot 02/16/2017 Please talk to PCP Glendale Chard, MD -  and ensure you discuss new shingarix vaccine  Followup 6 months or sooner if needed  (> 50% of this 15 min visit spent in face to face counseling or/and coordination of care)   Dr. Brand Males, M.D., [facility].C.P Pulmonary and Critical Care Medicine Staff Physician [place] [facility] Pager: [phone], If no answer or between  15:00h - 7:00h: call [phone]  02/16/2017 12:44 PM

## 2017-02-17 DIAGNOSIS — R32 Unspecified urinary incontinence: Secondary | ICD-10-CM | POA: Diagnosis not present

## 2017-02-17 DIAGNOSIS — Z794 Long term (current) use of insulin: Secondary | ICD-10-CM | POA: Diagnosis not present

## 2017-02-17 DIAGNOSIS — Z7982 Long term (current) use of aspirin: Secondary | ICD-10-CM | POA: Diagnosis not present

## 2017-02-17 DIAGNOSIS — M109 Gout, unspecified: Secondary | ICD-10-CM | POA: Diagnosis not present

## 2017-02-17 DIAGNOSIS — T84621D Infection and inflammatory reaction due to internal fixation device of left femur, subsequent encounter: Secondary | ICD-10-CM | POA: Diagnosis not present

## 2017-02-17 DIAGNOSIS — J849 Interstitial pulmonary disease, unspecified: Secondary | ICD-10-CM | POA: Diagnosis not present

## 2017-02-17 DIAGNOSIS — F329 Major depressive disorder, single episode, unspecified: Secondary | ICD-10-CM | POA: Diagnosis not present

## 2017-02-17 DIAGNOSIS — Z9981 Dependence on supplemental oxygen: Secondary | ICD-10-CM | POA: Diagnosis not present

## 2017-02-17 DIAGNOSIS — Z9181 History of falling: Secondary | ICD-10-CM | POA: Diagnosis not present

## 2017-02-17 DIAGNOSIS — E785 Hyperlipidemia, unspecified: Secondary | ICD-10-CM | POA: Diagnosis not present

## 2017-02-17 DIAGNOSIS — E1140 Type 2 diabetes mellitus with diabetic neuropathy, unspecified: Secondary | ICD-10-CM | POA: Diagnosis not present

## 2017-02-17 DIAGNOSIS — Z7951 Long term (current) use of inhaled steroids: Secondary | ICD-10-CM | POA: Diagnosis not present

## 2017-02-17 DIAGNOSIS — Z7952 Long term (current) use of systemic steroids: Secondary | ICD-10-CM | POA: Diagnosis not present

## 2017-02-17 DIAGNOSIS — I5032 Chronic diastolic (congestive) heart failure: Secondary | ICD-10-CM | POA: Diagnosis not present

## 2017-02-17 DIAGNOSIS — E039 Hypothyroidism, unspecified: Secondary | ICD-10-CM | POA: Diagnosis not present

## 2017-02-17 DIAGNOSIS — M069 Rheumatoid arthritis, unspecified: Secondary | ICD-10-CM | POA: Diagnosis not present

## 2017-02-17 DIAGNOSIS — D509 Iron deficiency anemia, unspecified: Secondary | ICD-10-CM | POA: Diagnosis not present

## 2017-02-17 DIAGNOSIS — S72002D Fracture of unspecified part of neck of left femur, subsequent encounter for closed fracture with routine healing: Secondary | ICD-10-CM | POA: Diagnosis not present

## 2017-02-18 ENCOUNTER — Ambulatory Visit (INDEPENDENT_AMBULATORY_CARE_PROVIDER_SITE_OTHER): Payer: Medicare Other | Admitting: Orthopaedic Surgery

## 2017-02-18 ENCOUNTER — Ambulatory Visit (INDEPENDENT_AMBULATORY_CARE_PROVIDER_SITE_OTHER): Payer: Medicare Other

## 2017-02-18 DIAGNOSIS — S72002D Fracture of unspecified part of neck of left femur, subsequent encounter for closed fracture with routine healing: Secondary | ICD-10-CM | POA: Diagnosis not present

## 2017-02-18 DIAGNOSIS — S72142A Displaced intertrochanteric fracture of left femur, initial encounter for closed fracture: Secondary | ICD-10-CM

## 2017-02-18 DIAGNOSIS — T84621D Infection and inflammatory reaction due to internal fixation device of left femur, subsequent encounter: Secondary | ICD-10-CM | POA: Diagnosis not present

## 2017-02-18 DIAGNOSIS — M25552 Pain in left hip: Secondary | ICD-10-CM | POA: Insufficient documentation

## 2017-02-18 DIAGNOSIS — F329 Major depressive disorder, single episode, unspecified: Secondary | ICD-10-CM | POA: Diagnosis not present

## 2017-02-18 DIAGNOSIS — M069 Rheumatoid arthritis, unspecified: Secondary | ICD-10-CM | POA: Diagnosis not present

## 2017-02-18 DIAGNOSIS — E1140 Type 2 diabetes mellitus with diabetic neuropathy, unspecified: Secondary | ICD-10-CM | POA: Diagnosis not present

## 2017-02-18 DIAGNOSIS — I5032 Chronic diastolic (congestive) heart failure: Secondary | ICD-10-CM | POA: Diagnosis not present

## 2017-02-18 NOTE — Progress Notes (Signed)
The patient is about 10 weeks status post a reduction and fixation of the left comminuted intertrochanteric and subtrochanteric femur fracture. She is morbidly obese individual and then a nursing facility for a while but now she is transitioned back to home. She is someone who used a walker before this due to chronic back and knee issues. She is also somewhat limited back to the OR postoperatively due to superficial soft tissue infection. She says that she is doing much better overall.  On examination of her hip incision it is healed completely. There is no evidence infection. No drainage or redness. She tolerates me putting her hip through internal and external rotation as well. X-rays show interval healing of the fracture with no complicating features of her left hip hardware.  At this point she'll continue increase her activities. She'll continue home therapy. We'll see her back in 4 weeks with final AP and lateral of the entire left femur. She may need outpatient therapy but we'll see how she doing in 4 weeks.

## 2017-02-20 DIAGNOSIS — M069 Rheumatoid arthritis, unspecified: Secondary | ICD-10-CM | POA: Diagnosis not present

## 2017-02-20 DIAGNOSIS — F329 Major depressive disorder, single episode, unspecified: Secondary | ICD-10-CM | POA: Diagnosis not present

## 2017-02-20 DIAGNOSIS — T84621D Infection and inflammatory reaction due to internal fixation device of left femur, subsequent encounter: Secondary | ICD-10-CM | POA: Diagnosis not present

## 2017-02-20 DIAGNOSIS — E1140 Type 2 diabetes mellitus with diabetic neuropathy, unspecified: Secondary | ICD-10-CM | POA: Diagnosis not present

## 2017-02-20 DIAGNOSIS — I5032 Chronic diastolic (congestive) heart failure: Secondary | ICD-10-CM | POA: Diagnosis not present

## 2017-02-20 DIAGNOSIS — S72002D Fracture of unspecified part of neck of left femur, subsequent encounter for closed fracture with routine healing: Secondary | ICD-10-CM | POA: Diagnosis not present

## 2017-02-24 DIAGNOSIS — T84621D Infection and inflammatory reaction due to internal fixation device of left femur, subsequent encounter: Secondary | ICD-10-CM | POA: Diagnosis not present

## 2017-02-24 DIAGNOSIS — S72002D Fracture of unspecified part of neck of left femur, subsequent encounter for closed fracture with routine healing: Secondary | ICD-10-CM | POA: Diagnosis not present

## 2017-02-24 DIAGNOSIS — E1140 Type 2 diabetes mellitus with diabetic neuropathy, unspecified: Secondary | ICD-10-CM | POA: Diagnosis not present

## 2017-02-24 DIAGNOSIS — I5032 Chronic diastolic (congestive) heart failure: Secondary | ICD-10-CM | POA: Diagnosis not present

## 2017-02-24 DIAGNOSIS — F329 Major depressive disorder, single episode, unspecified: Secondary | ICD-10-CM | POA: Diagnosis not present

## 2017-02-24 DIAGNOSIS — M069 Rheumatoid arthritis, unspecified: Secondary | ICD-10-CM | POA: Diagnosis not present

## 2017-02-25 DIAGNOSIS — T84621D Infection and inflammatory reaction due to internal fixation device of left femur, subsequent encounter: Secondary | ICD-10-CM | POA: Diagnosis not present

## 2017-02-25 DIAGNOSIS — F329 Major depressive disorder, single episode, unspecified: Secondary | ICD-10-CM | POA: Diagnosis not present

## 2017-02-25 DIAGNOSIS — M069 Rheumatoid arthritis, unspecified: Secondary | ICD-10-CM | POA: Diagnosis not present

## 2017-02-25 DIAGNOSIS — I5032 Chronic diastolic (congestive) heart failure: Secondary | ICD-10-CM | POA: Diagnosis not present

## 2017-02-25 DIAGNOSIS — S72002D Fracture of unspecified part of neck of left femur, subsequent encounter for closed fracture with routine healing: Secondary | ICD-10-CM | POA: Diagnosis not present

## 2017-02-25 DIAGNOSIS — E1140 Type 2 diabetes mellitus with diabetic neuropathy, unspecified: Secondary | ICD-10-CM | POA: Diagnosis not present

## 2017-02-26 ENCOUNTER — Encounter: Payer: Self-pay | Admitting: *Deleted

## 2017-02-26 ENCOUNTER — Other Ambulatory Visit: Payer: Self-pay | Admitting: *Deleted

## 2017-02-26 NOTE — Patient Outreach (Signed)
[name]  [place] Initial Home Visit Outreach, Transition of Care day 11  02/26/2017  [name] 1940/07/25 [id number]  [name] is a 76 y.o. female referred to [place] after recent hospitalization August 31-January 21, 2017 for (L) hip fracture after fall; Patient was discharged to [facility] for rehabilitation and was discharged from [facility] visit on February 13, 2017 with home health [facility]) services through [place]. Patient has history including, but not limited to, essential HTN, HLD, DM Type II, rheumatoid arthritis, depression, CHF, COPD with chronic respiratory failure, morbid obesity, and GERD.  HIPAA/ identity verified with patient in person during home visit today, and patient provides verbal and written consent for [facility] CM involvement in her care.  Patient's husband, 2 daughters, and her grandchild is present for today's home visit and all actively participate.  Pleasant 75 minute home visit.  Subjective: "There have been so many changes recently; I am trying to do the best I can, but all of the changes are kind of overwhelming."  Assessment:  [name] appears to recuperating fairly well after her discharge home from SNF, however, she has recently moved into a different home than she was in prior to being hospitalized, and she feels overwhelmed at times with all the recent changes she has undergone, as the unpacking from the move is still in progress.  [name] is actively participating in home health services for PT, OT, and nursing, and has thus far attended all scheduled provider appointments post-SNF discharge.  [name] has a very good understanding of her medications, and could benefit from ongoing reinforcement of self-health management of chronic disease states of CHF/ COPD- ILD/ and DM.  Although [name] has a supportive family network that assists in her care as indicated, she would like information on community resources  available to her for in-home care assistance for ADL's and for transportation, as her family's ability to assist is limited around their work schedules.    Today, patient reports that she "has been doing pretty good," but states that she has "a sick feeling stomach" today, reporting general feeling of nausea without emesis; states "just feels queasy."  Reports that she did eat this morning, and denies abdominal pain.  Continues to reports pain in her (L) leg/ hip after her recent hip replacement at "4/10" and again states that her pain medication helps to manage her pain "quite well."  Patient is in no obvious or apparent distress throughout entirety of today's home visit, although she is noted to be slightly short of breath when she talks in long sentences; patient reports this as her baseline, stating, "I am always like that."  Patient reports that she will be attending eye doctor appointment this afternoon, and only has "a little over an hour" for our home visit today.   Patient further reports:  Medications: -- Has all medicationsand takes as prescribed;denies questions about current medications; verbalizes a good general understanding of the purpose, dosing, and scheduling of her medications -- Patient was recently discharged from hospital and all medications were reviewed with patient during home visit today. -- Again reports self-manage medications directly out of bottles; reports has previously tried pill planner box, and was unable to use, as it caused her confusion around her medications, and was difficult with her arthritis. -- denies issues with swallowing medications -- reports gets her "regularly scheduled medications" through the [facility]) services: -- [place] services for PT, OT, RN in place  through [facility]; reports all disciplines have visited her this week -- verbalizes active participation in all home health services thus far  Provider  appointments: -- All upcoming provider appointments were reviewed with patient today; again encouraged patient to schedule PCP appointment post- SNF discharge; patient reports that she is planning to call/ schedule "soon" -- Attended pulmonary provider appointment post-SNF discharge 02/16/17; orthopedic surgeon provider appointment 02/18/17; denies changes to medications made during recent office visits  -- Verbalizes plans to attend eye doctor appointment this afternoon, stating family (daughters) will transport her to appointment.  -- States family will transport her to all provider appointments "for now," as she is unable to drive herself or get in and out of her (personal) vehicle due to her recent hip surgery -- reports attend pain clinic/ provider: Dr. Tessa Lerner; reports to make appointment soon  Safety/ Mobility/ Falls: -- denies new/ recent falls post-SNF discharge -- assistive devices: currently using wheelchair "most of the time," "walker occasionally;" reports wants to continue "getting stronger" so she can use wheelchair less and walker more frequently -- no obvious fall risks/ hazards noted in patient's home; however, due to patient's recent move there are multiple boxes placed throughout the home, but not blocking patient's pathway through the home; additionally, patient has O2 extension tubing on floor for O2 use throughout her home environment.  Hardwood floors/ vinyl throughout patient's kitchen/ living room.  Additionally, patient reports intermittent episodes of dizziness, which increases her risk of falls -- general fall risks/ prevention education provided and thoroughly discussed with patient today  [place] needs: -- had previously denied community resource needs, stating supportive family members that assist with care needs as indicated and transport patient to provider appointments -- reports today that she would like to know if there are resources for  personal care assistance available to her for helping with ADL's, as she reports that she requires some assistance with dressing/ bathing, light house keeping.  Reports family helps as allowed, but believes she could benefit from additional assistance if it available -- would like to know options for transportation assistance, as family is somewhat limited by their work schedules; states now that she is unable to drive, or ride with her husband in their family car due to her recent hip surgery, she feels somewhat isolated, "trapped" in the home -- discussed option of having [facility] CSW contact her about her community resource needs, and patient is agreeable to this; will place [facility] CSW referral   Advanced Directive (AD) Planning:   --reports currently has exisisting AD in place for Living Will and HCPOA, denies desire to make changes at this time.   -- reports that she is DNR, and this was verified through EMR 01/22/2017  Self-health management of CHF, COPD/ ILD/ DM: -- again states that she becomes easily "winded" with any exertion; reports difficult for her to talk for long periods of time, as this increases her shortness of breath, and this was demonstrated during today's home visit -- uses home O2 at 2 L/min via [place] "at night" and "whenever I need it during daytime" hours; reports has used during daytime hours 2-3 times post-SNF discharge; patient was encouraged to/ and did apply O2 during [facility] CM home visit today, as she demonstrated shortness of breath with SaO2 87-89% on room air; SaO2 levels increased to 91-91% after applying O2, and clearly helped with shortness of breath -- has been on home O2 "for 3 or 4 years now" -- currently  does not monitor/ record daily weights; reports that she tries to follow "heart healthy, low salt, diabetic" diet; states that she is going shopping for a new scale, as her old one is "very old" and that she is afraid of falling if she stands on her current scale.  Patient was  encouraged to begin monitoring/ recording daily weights, and I provided/ thoroughly reviewed EMMI educational material on basics of CHF; we discussed heart failure/ COPD zones with emphasis on yellow zone and corresponding action plan -- currently monitors blood glucose levels once daily, in morning, fasting; reports blood sugar ranges consistently 100-130 fasting; reports that she did not check blood sugar this morning prior to eating because she had too much to do getting ready for [facility] home visit and eye doctor appointment this afternoon -- reports that she has lost > 45 pounds over last 6 months by watching her portion intake; reports last known weight of 260 lbs -- reports concerns that her BP "is too low" on occasion, and states that she worries that she may pass out and fall; patient "used" to check BP's weekly, but has not been able to locate her BP cuff while in the process of moving; we discussed signs/ symptoms of hypotension as well as action plan for when she is having these symptoms.  I encouraged patient to try to locate her BP cuff so she can resume BP checks, and she agrees to do so; we briefly discussed option of [facility] CM providing scales/ automatic BP cuff, but patient wishes to wait for now, reporting that she will look for her BP cuff, and will decide after she shops for scales. -- discussed possibility of patient obtaining Life Alert monitoring system, and she and her family state they will consider this   Patient denies further issues, concerns, or problems today, and I provided/ confirmed that patient hasmy direct phone number, the main [facility] CM office phone number, and the [facility] CM 24-hour nurse advice phone number should issues arise prior to next scheduled [place] outreach by phone next week.  Objective:    BP 92/62   Pulse (!) 102   Resp 18   SpO2 92% Comment: on 2 L O2 via [place] ** SaO2 levels on room air 87-89% during visit **  Review of Systems  Constitutional:  Positive for malaise/fatigue and weight loss.       Obese female; reports ~45 pound weight loss (planned) over last 6 months; positive reinforcement provided with encouragement to continue weight loss efforts  Respiratory: Positive for shortness of breath. Negative for cough and wheezing.        SOB at rest when talking; recovers quickly when not active or talking.  SaO2 noted 87-89% on room air, bumps up to 89-92% when O2 applied at 2 L/min via [place]  Cardiovascular: Positive for leg swelling. Negative for chest pain.       +2-3 bilateral ankle/ pedal edema; patient reports as her baseline  Gastrointestinal: Positive for nausea. Negative for abdominal pain and constipation.       Reports generalized feeling of nausea, "sick stomach" today; reports has eaten today, denies emesis  Genitourinary: Positive for frequency and urgency.       Diuretic therapy  Musculoskeletal: Positive for joint pain and myalgias. Negative for falls.       No new falls today; reports primarily using wheelchair for mobility; actively participating in home health services for PT; reports occasionally using walker  Neurological: Positive for dizziness and  tingling.       Intermittent dizziness reported (none today); baseline neuropathy reported   Psychiatric/Behavioral: Negative for depression. The patient is not nervous/anxious.    Physical Exam  Constitutional: She is oriented to person, place, and time. She appears well-developed and well-nourished. No distress.  Cardiovascular: Regular rhythm and intact distal pulses.  Tachycardia present.   Pulses:      Radial pulses are 2+ on the right side, and 2+ on the left side.       Dorsalis pedis pulses are 1+ on the right side, and 1+ on the left side.  Distant heart sounds difficult to auscultate S1S2 definitively; patient is tachycardic  Respiratory: Effort normal. No respiratory distress. She has no wheezes. She has rales.  Occasional fine crackles auscultated over  posterior upper/ lower lung feilds  GI: Soft. Bowel sounds are normal.  Musculoskeletal: She exhibits edema.  See ROS  Neurological: She is alert and oriented to person, place, and time.  Skin: Skin is warm and dry. No erythema.  Psychiatric: She has a normal mood and affect. Her behavior is normal. Judgment and thought content normal.    Encounter Medications:   Outpatient Encounter Prescriptions as of 02/26/2017  Medication Sig Note  . acetaminophen (TYLENOL) 500 MG tablet Take 1,000 mg by mouth every 6 (six) hours as needed for moderate pain.    [name] albuterol (PROVENTIL HFA;VENTOLIN HFA) 108 (90 BASE) MCG/ACT inhaler Inhale 2 puffs into the lungs every 6 (six) hours as needed for wheezing or shortness of breath. 02/26/2017: Has not needed recently   . aspirin 325 MG tablet Take 1 tablet (325 mg total) by mouth daily.   [name] atorvastatin (LIPITOR) 20 MG tablet Take 10 mg by mouth every evening.    [name] azithromycin (ZITHROMAX) 500 MG tablet Take 1 tablet by mouth daily. TAKE ON MON, WED & FRI 01/11/2016: Ongoing ABT.   . Calcium Carb-Cholecalciferol (CALCIUM + D3 PO) Take 2 tablets by mouth daily with lunch.    . cholecalciferol (VITAMIN D) 1000 UNITS tablet Take 2,000 Units by mouth daily with lunch.    . cyclobenzaprine (FLEXERIL) 5 MG tablet Take 1 tablet (5 mg total) by mouth at bedtime as needed for muscle spasms.   . DULoxetine (CYMBALTA) 60 MG capsule Take 1 capsule (60 mg total) by mouth daily.   . fluticasone (FLONASE) 50 MCG/ACT nasal spray Place 2 sprays into both nostrils daily.   . furosemide (LASIX) 20 MG tablet Take 80 mg by mouth daily.    [name] gabapentin (NEURONTIN) 600 MG tablet Take two in the morning , Two mid- afternoon and two at bedtime (Patient taking differently: Take 1,200 mg by mouth 3 (three) times daily. )   . guaiFENesin (MUCINEX) 600 MG 12 hr tablet Take 1,200 mg by mouth 2 (two) times daily.   . hydroxychloroquine (PLAQUENIL) 200 MG tablet Take 200 mg by mouth daily.    [name] ipratropium-albuterol (DUONEB) 0.5-2.5 (3) MG/3ML SOLN Take 3 mLs by nebulization. Every 6 hours as needed for cough or wheezing   . iron polysaccharides (NIFEREX) 150 MG capsule Take 150 mg by mouth daily.   [name] levothyroxine (SYNTHROID, LEVOTHROID) 50 MCG tablet Take 1 tablet (50 mcg total) by mouth daily.   . metoprolol succinate (TOPROL-XL) 50 MG 24 hr tablet Take 1 tablet (50 mg total) by mouth every morning. Take with or immediately following a meal.   . morphine (MS CONTIN) 15 MG 12 hr tablet Take 1 tablet (15 mg total) by  mouth every 12 (twelve) hours.   [name] morphine (MSIR) 15 MG tablet Take 1 tablet (15 mg total) by mouth every 8 (eight) hours as needed for severe pain.   . Multiple Vitamin (MULTIVITAMIN WITH MINERALS) TABS Take 1 tablet by mouth daily.    [name] olmesartan (BENICAR) 20 MG tablet Take 1 tablet (20 mg total) by mouth daily.   . OXYGEN-HELIUM IN Inhale 2 L into the lungs at bedtime. And on exertion   . pantoprazole (PROTONIX) 40 MG tablet take 1 tablet by mouth once daily MINUTES BEFORE 1ST MEAL OF THE DAY   . polyethylene glycol (MIRALAX / GLYCOLAX) packet Take 17 g by mouth daily.   . potassium chloride (K-DUR,KLOR-CON) 10 MEQ tablet Take 20 mEq by mouth 2 (two) times daily. 02/26/2017: Takes once daily  . predniSONE (DELTASONE) 10 MG tablet Take 10 mg by mouth every morning.    . sodium chloride (OCEAN) 0.65 % SOLN nasal spray Place 1 spray into both nostrils as needed for congestion.   [name] Spacer/Aero-Holding Chambers (AEROCHAMBER MV) inhaler Use as instructed   . VICTOZA 18 MG/3ML SOPN Inject 1.8 mg as directed daily.    [name] allopurinol (ZYLOPRIM) 300 MG tablet Take 300 mg by mouth daily.  02/26/2017: Reports taking daily (through [place])   . docusate sodium (COLACE) 100 MG capsule Take 1 capsule (100 mg total) by mouth 2 (two) times daily. (Patient not taking: Reported on 02/26/2017)    No facility-administered encounter medications on file as of 02/26/2017.     Functional  Status:   In your present state of health, do you have any difficulty performing the following activities: 02/26/2017 02/16/2017  Hearing? N -  Vision? N -  Difficulty concentrating or making decisions? - N  Walking or climbing stairs? - Y  Dressing or bathing? - N  Doing errands, shopping? - Y  Comment - reports family transports to errands, appointments  Preparing Food and eating ? Y -  Comment Family assists with meal preparation; patient reports no issues with eating -  Using the Toilet? N -  In the past six months, have you accidently leaked urine? Y -  Comment Patient reports wears depends -  Do you have problems with loss of bowel control? N -  Managing your Medications? - N  Managing your Finances? N -  Housekeeping or managing your Housekeeping? Y -  Some recent data might be hidden    Fall/Depression Screening:    Fall Risk  02/26/2017 02/16/2017 11/28/2016  Falls in the past year? - Yes Yes  Comment - - -  Number falls in past yr: - 2 or more 1  Injury with Fall? - Yes -  Risk Factor Category  - High Fall Risk -  Risk for fall due to : History of fall(s);Impaired balance/gait;Impaired mobility;Medication side effect;Other (Comment) History of fall(s);Medication side effect;Impaired mobility;Other (Comment) Impaired balance/gait  Risk for fall due to: Comment Recent hip fracture/ hip replacement Obesity -  Follow up Falls evaluation completed;Falls prevention discussed;Education provided Falls prevention discussed Falls prevention discussed  Comment - - -   PHQ 2/9 Scores 02/26/2017 11/28/2016 09/04/2016 08/09/2015 01/05/2015 09/20/2014 08/10/2013  PHQ - 2 Score 0 0 0 _0 PHQ- 9 Score - - - - 10 16 -   Plan:  Patient will take medications as prescribed and will attend all provider appointments  Patient will promptly notify her providers of any new concerns, issues or problems that arise  Patient will  actively participate in home health services as ordered post-SNF  discharge  Patient will schedule post-SNF discharge PCP appointment promptly  I will place [facility] CSW referral for evaluation of community resources available to her for in-home care assistance for ADL's and for transportation  I will share notes from today's home visit with patient's PCP   [place] outreach to continue with scheduled [facility] RN CCM telephone outreach next week  [facility] CM Care Plan Problem One     Most Recent Value  Care Plan Problem One  Risk for hospital readmission related to recent hospitalizations and recent SNF visit for rehabilitation  Role Documenting the Problem One  Care Management Coordinator  Care Plan for Problem One  Active  [facility] Long Term Goal   Patient will not experience hospital readmission within the next 31 days  [facility] Long Term Goal Start Date  02/16/17  Interventions for Problem One Long Term Goal  Using teachback method, reviewed previous patient provider appointments post- SNF discharge,  encouraged patient to schedule PCP appointment promptly.  Home medication reconciliation performed,   discussed home health services in place,  [place] initial home visit completed  [facility] CM Short Term Goal #1   Within the next 14 days, patient will scheduled and attend PCP appointment for hospital SNF post-discharge follow up  [facility] CM Short Term Goal #1 Start Date  02/16/17  Interventions for Short Term Goal #1  Using teachback method, discussed importance of patient promptly scheduling and attending PCP follow up appointment and again encouraged patient to make this appointment  [facility] CM Short Term Goal #2   Over the next 30 days, patient will continue to actively participate in home health services as ordered post- SNF discharge, as evidenced by patient reporting during [place] outreach  [facility] CM Short Term Goal #2 Start Date  02/26/17  Interventions for Short Term Goal #2  Using teachback method, discussed with patient [place] services in place, and confirmed that she is  actively participating in services as ordered,  confirmed that patient has contact information for [facility] agency  [facility] CM Short Term Goal #3  Over the next 14 days, patient will discuss her community resource needs/ options with [facility] CSW, as evidenced by patient reporting and collaboration with [facility] CSW during [place] outreach  [facility] CM Short Term Goal #3 Start Date  02/26/17  Interventions for Short [place] Goal #3  Using teachback method, discussed patient's questions/ needs for assistance with her,  placed [facility] CSW referral, and encouraged patient to think about/ discuss needs with [facility] CSW    [facility] CM Care Plan Problem Two     Most Recent Value  Care Plan Problem Two  Need for reinforcement of self-health management strategies for chronic disease states of CHF, COPD, and DM  Role Documenting the Problem Two  Care Management Coordinator  Care Plan for Problem Two  Active  Interventions for Problem Two Long Term Goal   Using teachback method, discussed with patient current understanding of chronic disease states of CHF/ COPD and DM,  provided and reviewed with patient EMMI educational material for self-health management of CHF/ COPD,  discussed concept of CHF/ COPD zones, with emphasis on yellow zone  [facility] Long Term Goal  Over the next 50 days, patient will be able to verbalize 3 strategies for self-health management of CHF/ COPD, as evidenced by patient reporting of same during [place] outreach  [facility] Long Term Goal Start Date  02/26/17     I appreciate the opportunity to participate in Alexya' care,  Oneta Rack, RN, BSN, [place] Coordinator [facility] Care Management  [phone]

## 2017-02-27 ENCOUNTER — Other Ambulatory Visit: Payer: Self-pay | Admitting: Licensed Clinical Social Worker

## 2017-02-27 ENCOUNTER — Encounter: Payer: Self-pay | Admitting: *Deleted

## 2017-02-27 NOTE — Patient Outreach (Signed)
[facility]) Care Management  02/27/2017  [name] November 18, 1940 [id number]  Assessment-CSW completed outreach attempt today. CSW unable to reach patient successfully as phone number reports that number has been changed or disconnected.CSW unable to leave a voice message.   Plan-CSW will complete second outreach attempt within one week.  [name], BSW, MSW, [name] Phone: [phone] Fax: [phone]

## 2017-03-02 ENCOUNTER — Other Ambulatory Visit: Payer: Self-pay | Admitting: Licensed Clinical Social Worker

## 2017-03-02 NOTE — Patient Outreach (Signed)
[place] [facility]) Care Management  03/02/2017  [name] Sep 02, 1940 [id number]  Assessment- CSW completed outreach call to patient and was able to successfully reach her. HIPPA verifications were received. CSW introduced self, reason for call and of [facility] social work services. Patient is accepting on social work assistance. Patient reports tht she is interested in gaining personal care resource information. She shares that she is not interested in private pay caregivers at this time. CSW educated patient In Home Aide Services through [place] and Aid and Attendance through the [place]. Patient reports having issues with transportation at this time. She reports that she has a Lucianne Lei that her spouse drives but she is unable to get in the [place] due to hip pain. CSW educated patient on available transportation resources such as Haematologist. However, patient declined services as she does not like their long wait times and does not feel comfortable using service at this time. CSW attempted to schedule home visit for this week but patient reports being overwhelmed with multiple appointments. Patient prefers for CSW to contact her next week to schedule home visit.  Plan-CSW will send involvement letter to PCP. CSW will make next outreach within one week.  [facility] CM Care Plan Problem One     Most Recent Value  Care Plan Problem One  Lack of care within the home and need for community resource education  Role Documenting the Problem One  Clinical Social Worker  Care Plan for Problem One  Active  [facility] CM Short Term Goal #1   Patient will gain personal care resource education within 30 days  [facility] CM Short Term Goal #1 Start Date  03/02/17  Interventions for Short Term Goal #1  CSW will complete home visit within 30 days and will evaluate patient. CSW will then place patient on the wait list for In [facility] and will provide education on the eligibility process. CSW will provide further  education on available personal care resources.     [name], BSW, MSW, [place].[email or web address] Phone: [phone] Fax: [phone]

## 2017-03-04 ENCOUNTER — Other Ambulatory Visit: Payer: Self-pay | Admitting: *Deleted

## 2017-03-04 ENCOUNTER — Encounter: Payer: Self-pay | Admitting: *Deleted
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
## 2017-03-05 DIAGNOSIS — M069 Rheumatoid arthritis, unspecified: Secondary | ICD-10-CM | POA: Diagnosis not present

## 2017-03-05 DIAGNOSIS — S72002D Fracture of unspecified part of neck of left femur, subsequent encounter for closed fracture with routine healing: Secondary | ICD-10-CM | POA: Diagnosis not present

## 2017-03-05 DIAGNOSIS — I5032 Chronic diastolic (congestive) heart failure: Secondary | ICD-10-CM | POA: Diagnosis not present

## 2017-03-05 DIAGNOSIS — T84621D Infection and inflammatory reaction due to internal fixation device of left femur, subsequent encounter: Secondary | ICD-10-CM | POA: Diagnosis not present

## 2017-03-05 DIAGNOSIS — E1140 Type 2 diabetes mellitus with diabetic neuropathy, unspecified: Secondary | ICD-10-CM | POA: Diagnosis not present

## 2017-03-05 DIAGNOSIS — F329 Major depressive disorder, single episode, unspecified: Secondary | ICD-10-CM | POA: Diagnosis not present

## 2017-03-06 DIAGNOSIS — F329 Major depressive disorder, single episode, unspecified: Secondary | ICD-10-CM | POA: Diagnosis not present

## 2017-03-06 DIAGNOSIS — S72002A Fracture of unspecified part of neck of left femur, initial encounter for closed fracture: Secondary | ICD-10-CM | POA: Diagnosis not present

## 2017-03-06 DIAGNOSIS — E1122 Type 2 diabetes mellitus with diabetic chronic kidney disease: Secondary | ICD-10-CM | POA: Diagnosis not present

## 2017-03-06 DIAGNOSIS — S72002S Fracture of unspecified part of neck of left femur, sequela: Secondary | ICD-10-CM | POA: Diagnosis not present

## 2017-03-06 DIAGNOSIS — N08 Glomerular disorders in diseases classified elsewhere: Secondary | ICD-10-CM | POA: Diagnosis not present

## 2017-03-06 DIAGNOSIS — E1140 Type 2 diabetes mellitus with diabetic neuropathy, unspecified: Secondary | ICD-10-CM | POA: Diagnosis not present

## 2017-03-06 DIAGNOSIS — M069 Rheumatoid arthritis, unspecified: Secondary | ICD-10-CM | POA: Diagnosis not present

## 2017-03-06 DIAGNOSIS — T84621D Infection and inflammatory reaction due to internal fixation device of left femur, subsequent encounter: Secondary | ICD-10-CM | POA: Diagnosis not present

## 2017-03-06 DIAGNOSIS — S72002D Fracture of unspecified part of neck of left femur, subsequent encounter for closed fracture with routine healing: Secondary | ICD-10-CM | POA: Diagnosis not present

## 2017-03-06 DIAGNOSIS — I5032 Chronic diastolic (congestive) heart failure: Secondary | ICD-10-CM | POA: Diagnosis not present

## 2017-03-06 DIAGNOSIS — I129 Hypertensive chronic kidney disease with stage 1 through stage 4 chronic kidney disease, or unspecified chronic kidney disease: Secondary | ICD-10-CM | POA: Diagnosis not present

## 2017-03-06 DIAGNOSIS — R21 Rash and other nonspecific skin eruption: Secondary | ICD-10-CM | POA: Diagnosis not present

## 2017-03-06 DIAGNOSIS — E039 Hypothyroidism, unspecified: Secondary | ICD-10-CM | POA: Diagnosis not present

## 2017-03-09 ENCOUNTER — Other Ambulatory Visit: Payer: Self-pay | Admitting: Licensed Clinical Social Worker

## 2017-03-09 DIAGNOSIS — T84621D Infection and inflammatory reaction due to internal fixation device of left femur, subsequent encounter: Secondary | ICD-10-CM | POA: Diagnosis not present

## 2017-03-09 DIAGNOSIS — M069 Rheumatoid arthritis, unspecified: Secondary | ICD-10-CM | POA: Diagnosis not present

## 2017-03-09 DIAGNOSIS — S72002D Fracture of unspecified part of neck of left femur, subsequent encounter for closed fracture with routine healing: Secondary | ICD-10-CM | POA: Diagnosis not present

## 2017-03-09 DIAGNOSIS — E1140 Type 2 diabetes mellitus with diabetic neuropathy, unspecified: Secondary | ICD-10-CM | POA: Diagnosis not present

## 2017-03-09 DIAGNOSIS — F329 Major depressive disorder, single episode, unspecified: Secondary | ICD-10-CM | POA: Diagnosis not present

## 2017-03-09 DIAGNOSIS — I5032 Chronic diastolic (congestive) heart failure: Secondary | ICD-10-CM | POA: Diagnosis not present

## 2017-03-09 NOTE — Patient Outreach (Signed)
[place] [facility]) Care Management  03/09/2017  Sefora ALEYA DURNELL [birth year] [id number]  Assessment-CSW completed outreach attempt today to schedule home visit. CSW unable to reach patient successfully or leave a voice message.   Plan-CSW will await return call or complete an additional outreach within one week.  [name], BSW, MSW, [email or web address] [email or web address] Phone: [phone] Fax: [phone]

## 2017-03-09 NOTE — Patient Outreach (Signed)
[place] [facility]) Care Management  03/09/2017  [name] Sep 20, 1940 [id number]  Assessment-CSW received incoming return call from patient. CSW successfully scheduled home visit for 03/10/17 in order to provide community resource information.  Plan-CSW will complete home visit tomorrow.  [name], BSW, MSW, [email or web address].[name] Phone: [phone] Fax: [phone]

## 2017-03-10 ENCOUNTER — Other Ambulatory Visit: Payer: Self-pay | Admitting: Licensed Clinical Social Worker
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
## 2017-03-11 ENCOUNTER — Other Ambulatory Visit: Payer: Self-pay | Admitting: *Deleted

## 2017-03-11 DIAGNOSIS — T84621D Infection and inflammatory reaction due to internal fixation device of left femur, subsequent encounter: Secondary | ICD-10-CM | POA: Diagnosis not present

## 2017-03-11 DIAGNOSIS — F329 Major depressive disorder, single episode, unspecified: Secondary | ICD-10-CM | POA: Diagnosis not present

## 2017-03-11 DIAGNOSIS — I5032 Chronic diastolic (congestive) heart failure: Secondary | ICD-10-CM | POA: Diagnosis not present

## 2017-03-11 DIAGNOSIS — M069 Rheumatoid arthritis, unspecified: Secondary | ICD-10-CM | POA: Diagnosis not present

## 2017-03-11 DIAGNOSIS — E1140 Type 2 diabetes mellitus with diabetic neuropathy, unspecified: Secondary | ICD-10-CM | POA: Diagnosis not present

## 2017-03-11 DIAGNOSIS — S72002D Fracture of unspecified part of neck of left femur, subsequent encounter for closed fracture with routine healing: Secondary | ICD-10-CM | POA: Diagnosis not present

## 2017-03-11 NOTE — Patient Outreach (Signed)
Successful telephone encounter to [name], 75 year old female for transition of care/recent hospitalization August 31-September 5,2018 for left hip fracture after fall to which then  pt was admitted to SNF for rehab/discharged home February 13, 2017.  This RN CM covering for [facility] (pt's primary RN CM).   Spoke with pt, HIPAA identifiers verified, informed pt covering for [name] (primary RN CM).   Pt reports everything is good, started weighing/weights staying the same every day- 266 lbs, has sob when moving around, checking O 2 sat- 90-92 %,continues on 2 Liters [place].  Pt reports has some swelling in left knee/leg -site of hip surgery, continue to have pain/today +6, pain medication helping/ready to take some as HH PT coming soon/in a hurry to get off phone.   Pt reports no complaints of dizziness, checking BP- today 134/84, sugar 94.   Pt reports followed up with PCP 03/06/17.   Discussed with pt [name] primary RN CM will be following up with her next week telephonically, schedule home visit.     Plan:  Plan to update pt's primary [facility] RN CM of today's call to pt.    Zara Chess.   [place] Care Management  [phone]

## 2017-03-12 DIAGNOSIS — H20013 Primary iridocyclitis, bilateral: Secondary | ICD-10-CM | POA: Diagnosis not present

## 2017-03-13 DIAGNOSIS — F329 Major depressive disorder, single episode, unspecified: Secondary | ICD-10-CM | POA: Diagnosis not present

## 2017-03-13 DIAGNOSIS — E1140 Type 2 diabetes mellitus with diabetic neuropathy, unspecified: Secondary | ICD-10-CM | POA: Diagnosis not present

## 2017-03-13 DIAGNOSIS — S72002D Fracture of unspecified part of neck of left femur, subsequent encounter for closed fracture with routine healing: Secondary | ICD-10-CM | POA: Diagnosis not present

## 2017-03-13 DIAGNOSIS — M069 Rheumatoid arthritis, unspecified: Secondary | ICD-10-CM | POA: Diagnosis not present

## 2017-03-13 DIAGNOSIS — T84621D Infection and inflammatory reaction due to internal fixation device of left femur, subsequent encounter: Secondary | ICD-10-CM | POA: Diagnosis not present

## 2017-03-13 DIAGNOSIS — I5032 Chronic diastolic (congestive) heart failure: Secondary | ICD-10-CM | POA: Diagnosis not present

## 2017-03-14 DIAGNOSIS — F329 Major depressive disorder, single episode, unspecified: Secondary | ICD-10-CM | POA: Diagnosis not present

## 2017-03-14 DIAGNOSIS — S72002D Fracture of unspecified part of neck of left femur, subsequent encounter for closed fracture with routine healing: Secondary | ICD-10-CM | POA: Diagnosis not present

## 2017-03-14 DIAGNOSIS — M069 Rheumatoid arthritis, unspecified: Secondary | ICD-10-CM | POA: Diagnosis not present

## 2017-03-14 DIAGNOSIS — T84621D Infection and inflammatory reaction due to internal fixation device of left femur, subsequent encounter: Secondary | ICD-10-CM | POA: Diagnosis not present

## 2017-03-14 DIAGNOSIS — E1140 Type 2 diabetes mellitus with diabetic neuropathy, unspecified: Secondary | ICD-10-CM | POA: Diagnosis not present

## 2017-03-14 DIAGNOSIS — I5032 Chronic diastolic (congestive) heart failure: Secondary | ICD-10-CM | POA: Diagnosis not present

## 2017-03-16 ENCOUNTER — Other Ambulatory Visit: Payer: Self-pay | Admitting: Licensed Clinical Social Worker

## 2017-03-16 DIAGNOSIS — S72002D Fracture of unspecified part of neck of left femur, subsequent encounter for closed fracture with routine healing: Secondary | ICD-10-CM | POA: Diagnosis not present

## 2017-03-16 DIAGNOSIS — T84621D Infection and inflammatory reaction due to internal fixation device of left femur, subsequent encounter: Secondary | ICD-10-CM | POA: Diagnosis not present

## 2017-03-16 DIAGNOSIS — F329 Major depressive disorder, single episode, unspecified: Secondary | ICD-10-CM | POA: Diagnosis not present

## 2017-03-16 DIAGNOSIS — E1140 Type 2 diabetes mellitus with diabetic neuropathy, unspecified: Secondary | ICD-10-CM | POA: Diagnosis not present

## 2017-03-16 DIAGNOSIS — I5032 Chronic diastolic (congestive) heart failure: Secondary | ICD-10-CM | POA: Diagnosis not present

## 2017-03-16 DIAGNOSIS — M069 Rheumatoid arthritis, unspecified: Secondary | ICD-10-CM | POA: Diagnosis not present

## 2017-03-16 NOTE — Patient Outreach (Signed)
[place] [facility]) Care Management  03/16/2017  [name] 1940-10-06 [id number]  Assessment- CSW completed cal to In [place] program and was able to successfully complete referral to program. Wait list is up to a year at this time. CSW completed call to patient's residence in order to provide update. Spouse answered but reported that patient is currently busy and unable to come to the phone but will contact CSW back later.  Plan-CSW will await for return call or complete additional outreach within one week.  [name], BSW, MSW, [email or web address].[email or web address] Phone: [phone] Fax: [phone]

## 2017-03-17 ENCOUNTER — Ambulatory Visit (INDEPENDENT_AMBULATORY_CARE_PROVIDER_SITE_OTHER): Payer: Medicare Other

## 2017-03-17 ENCOUNTER — Ambulatory Visit (INDEPENDENT_AMBULATORY_CARE_PROVIDER_SITE_OTHER): Payer: Medicare Other | Admitting: Orthopaedic Surgery

## 2017-03-17 DIAGNOSIS — S72145D Nondisplaced intertrochanteric fracture of left femur, subsequent encounter for closed fracture with routine healing: Secondary | ICD-10-CM
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
## 2017-03-18 ENCOUNTER — Encounter: Payer: Self-pay | Admitting: *Deleted

## 2017-03-18 ENCOUNTER — Other Ambulatory Visit: Payer: Self-pay | Admitting: *Deleted

## 2017-03-18 NOTE — Patient Outreach (Signed)
[facility]) Care Management [facility] Community CM Telephone Outreach, Transition of Care day 31  03/18/2017  [name] Jul 14, 1940 [id number]  Successful telephone outreach to [employer], 76 y.o.femalereferred to [place] after recent hospitalization August 31-January 21, 2017 for (L) hip fracture after fall; Patient was discharged to SNF for rehabilitation and was discharged from [facility] visit on February 13, 2017 with home health [facility]) services through [place]. Patient has history including, but not limited to, essential HTN, HLD, DM Type II, rheumatoid arthritis, depression, CHF, COPD with chronic respiratory failure, morbid obesity, and GERD. HIPAA/ identity verified with patient during phone call today.    Today, patient reports that she "is finally having more good days than bad ones," and states that she "feels great" this morning.  Continues toreport ongoing pain in her (L) leg/ hip after her recent hip replacement at "6/10" and againstates that her pain medication and rest helps to manage her pain "verywell." Patient sounds to be in no obvious or apparent distress throughout entirety of today's phone call.  Patient further reports:  Medications: -- Has all medicationsand is taking as prescribed;denies any recent changes to medications and denies questions about current medications; continues to self-manage medications  Home health [facility]) services: -- [place] services for PT, OT, RN continue through [facility]; reports that [facility] RN "visited last week," and that [facility] PT/ OT have both visited her this week. -- reports that she expects to be visited by [facility] RN "later this week," and that [facility] PT will also visit again later this week -- verbalizes active participation in all home health services as ordered post- SNF discharge, states she believes "it is really helping."  Provider appointments: -- All recent and upcoming provider  appointments were reviewed with patient today; reports attended PCP office visit 03/06/17 and orthopedic provider "yesterday," and denies any changes to her medications or overall plan of care as a result of recent provider visits.  Reports that she "got a real good report" from both recent provider visits -- reports PCP is helping her obtain a "motorized mobility chair" for use at home, states the representative for the chair company will be visiting her to perform an assessment "next week." -- States family will continue to provide transportation her to all provider appointments as she remains unable to drive herself or get in and out of her (personal) vehicledue to her recent hip surgery -- reports she has not yet made appointment with pain clinic/ provider: Dr. Tessa Lerner; again reports to make appointment soon, stating that she plans to "call them today."  Safety/ Mobility/ Falls: -- denies new/ recent falls post-SNF discharge -- general fall risks/ prevention education reiterated with patient today  Social/ Community Resource needs: -- confirmed that she is actively working with [facility] CSW re: stated community resource needs,encouraged patient to continue communication with [facility] CSW, patient verbalized understanding and agreement  Self-health management of CHF, COPD/ ILD/ DM: -- continues using home O2 at 2 L/min via [place] "mostly at night" and "whenever needed" during daytime" hours.  Reports "usual shortness of breath" with activity, and states that when she becomes winded, she rests immediately and this helpd her to recover "completely" within a "few minutes." -- reports that she has been monitoring and recording BP and pulse oximeter "every day;" reports SaO2 this morning of "92% without O2 on," and again endorses this as her baseline; reports BP this morning of 148/86; states that she "knows her BP was  a little high this morning," reports that she "feels fine, and attributes this BP value to  having a "busy" morning. -- has received scales and is now monitoring and recording daily weights; reports weight this morning of 264 lbs; reports general weight range over last week of "262-264 pounds." Reiterated weight gain guidelines/ rationale in setting of CHF, to contact medical providers for weight gain > 3 lbs overnight or 5 lbs in one week, and patient verbalizes understanding and agreement. Encouraged patient to continue daily weight monitoring and recording and she verbalizes understanding and agrees to do so. Reiterated previously provided education re: heart failure/ COPD zones with emphasis on yellow zone and corresponding action plan -- currently monitors blood glucose levels once daily, in morning, fasting; again reports blood sugar ranges remain consistently 100-130 fasting; reports fasting blood sugar this morning of "121." -- denies current or recent episodes of dizziness  Patient denies further issues, concerns, or problems today, and I confirmed that patient hasmy direct phone number, the main [facility] CM office phone number, and the [facility] CM 24-hour nurse advice phone number should issues arise prior to next [place] outreach with scheduled [facility] RN CCM routine home visit in 2 weeks.  Plan:  Patient will take medications as prescribed and will attend all provider appointments  Patient will promptly notify her providers of any new concerns, issues or problems that arise  Patient will actively participate in home health services as ordered post-SNF discharge  Patient will maintain contact with [facility] CSW re: statedcommunity resource needs for in-home care assistance for ADL's and for transportation  Patient will continue monitoring and recording daily weights, BP's blood sugars and SaO2 values  [facility] CM outreach to continue with scheduled [facility] RN CCM routine home visit in 2 weeks  Oneta Rack, RN, BSN, [employer] Coordinator [facility] Care  Management  [phone]

## 2017-03-19 DIAGNOSIS — I5032 Chronic diastolic (congestive) heart failure: Secondary | ICD-10-CM | POA: Diagnosis not present

## 2017-03-19 DIAGNOSIS — E1140 Type 2 diabetes mellitus with diabetic neuropathy, unspecified: Secondary | ICD-10-CM | POA: Diagnosis not present

## 2017-03-19 DIAGNOSIS — F329 Major depressive disorder, single episode, unspecified: Secondary | ICD-10-CM | POA: Diagnosis not present

## 2017-03-19 DIAGNOSIS — T84621D Infection and inflammatory reaction due to internal fixation device of left femur, subsequent encounter: Secondary | ICD-10-CM | POA: Diagnosis not present

## 2017-03-19 DIAGNOSIS — M069 Rheumatoid arthritis, unspecified: Secondary | ICD-10-CM | POA: Diagnosis not present

## 2017-03-19 DIAGNOSIS — S72002D Fracture of unspecified part of neck of left femur, subsequent encounter for closed fracture with routine healing: Secondary | ICD-10-CM | POA: Diagnosis not present

## 2017-03-20 ENCOUNTER — Other Ambulatory Visit: Payer: Self-pay | Admitting: Licensed Clinical Social Worker

## 2017-03-20 DIAGNOSIS — S72002D Fracture of unspecified part of neck of left femur, subsequent encounter for closed fracture with routine healing: Secondary | ICD-10-CM | POA: Diagnosis not present

## 2017-03-20 DIAGNOSIS — M069 Rheumatoid arthritis, unspecified: Secondary | ICD-10-CM | POA: Diagnosis not present

## 2017-03-20 DIAGNOSIS — E1140 Type 2 diabetes mellitus with diabetic neuropathy, unspecified: Secondary | ICD-10-CM | POA: Diagnosis not present

## 2017-03-20 DIAGNOSIS — I5032 Chronic diastolic (congestive) heart failure: Secondary | ICD-10-CM | POA: Diagnosis not present

## 2017-03-20 DIAGNOSIS — T84621D Infection and inflammatory reaction due to internal fixation device of left femur, subsequent encounter: Secondary | ICD-10-CM | POA: Diagnosis not present

## 2017-03-20 DIAGNOSIS — F329 Major depressive disorder, single episode, unspecified: Secondary | ICD-10-CM | POA: Diagnosis not present

## 2017-03-20 NOTE — Patient Outreach (Signed)
[facility]) Care Management  03/20/2017  Kailyn CHAKARA BOGNAR 06/07/1940 [id number]  Assessment- CSW completed outreach call to patient and she answered accordingly. Patient reports that she has been in touch with the [place] [facility] at [facility] and that they mailed her documents that her doctor needs to complete. She stated that her spouse dropped off these documents to her doctor already and she is waiting for them to be completed. CSW provided education on the next steps in the process. Patient was receptive to information provided. CSW informed patient that she was able to successfully complete referral to the [facility] program. Patient thankful for this update and stated she is currently in the middle of PT.  Plan-CSW will follow up within 30 days.  [name], BSW, MSW, [email or web address] Phone: [phone] Fax: [phone]

## 2017-03-24 DIAGNOSIS — T84621D Infection and inflammatory reaction due to internal fixation device of left femur, subsequent encounter: Secondary | ICD-10-CM | POA: Diagnosis not present

## 2017-03-24 DIAGNOSIS — M069 Rheumatoid arthritis, unspecified: Secondary | ICD-10-CM | POA: Diagnosis not present

## 2017-03-24 DIAGNOSIS — S72002D Fracture of unspecified part of neck of left femur, subsequent encounter for closed fracture with routine healing: Secondary | ICD-10-CM | POA: Diagnosis not present

## 2017-03-24 DIAGNOSIS — E1140 Type 2 diabetes mellitus with diabetic neuropathy, unspecified: Secondary | ICD-10-CM | POA: Diagnosis not present

## 2017-03-24 DIAGNOSIS — F329 Major depressive disorder, single episode, unspecified: Secondary | ICD-10-CM | POA: Diagnosis not present

## 2017-03-24 DIAGNOSIS — I5032 Chronic diastolic (congestive) heart failure: Secondary | ICD-10-CM | POA: Diagnosis not present

## 2017-03-25 DIAGNOSIS — E1140 Type 2 diabetes mellitus with diabetic neuropathy, unspecified: Secondary | ICD-10-CM | POA: Diagnosis not present

## 2017-03-25 DIAGNOSIS — S72002D Fracture of unspecified part of neck of left femur, subsequent encounter for closed fracture with routine healing: Secondary | ICD-10-CM | POA: Diagnosis not present

## 2017-03-25 DIAGNOSIS — I5032 Chronic diastolic (congestive) heart failure: Secondary | ICD-10-CM | POA: Diagnosis not present

## 2017-03-25 DIAGNOSIS — F329 Major depressive disorder, single episode, unspecified: Secondary | ICD-10-CM | POA: Diagnosis not present

## 2017-03-25 DIAGNOSIS — M069 Rheumatoid arthritis, unspecified: Secondary | ICD-10-CM | POA: Diagnosis not present

## 2017-03-25 DIAGNOSIS — T84621D Infection and inflammatory reaction due to internal fixation device of left femur, subsequent encounter: Secondary | ICD-10-CM | POA: Diagnosis not present

## 2017-03-26 DIAGNOSIS — I5032 Chronic diastolic (congestive) heart failure: Secondary | ICD-10-CM | POA: Diagnosis not present

## 2017-03-26 DIAGNOSIS — H524 Presbyopia: Secondary | ICD-10-CM | POA: Diagnosis not present

## 2017-03-26 DIAGNOSIS — H2513 Age-related nuclear cataract, bilateral: Secondary | ICD-10-CM | POA: Diagnosis not present

## 2017-03-26 DIAGNOSIS — Z7984 Long term (current) use of oral hypoglycemic drugs: Secondary | ICD-10-CM | POA: Insufficient documentation

## 2017-03-26 DIAGNOSIS — H52203 Unspecified astigmatism, bilateral: Secondary | ICD-10-CM | POA: Diagnosis not present

## 2017-03-26 DIAGNOSIS — S72002D Fracture of unspecified part of neck of left femur, subsequent encounter for closed fracture with routine healing: Secondary | ICD-10-CM | POA: Diagnosis not present

## 2017-03-26 DIAGNOSIS — E119 Type 2 diabetes mellitus without complications: Secondary | ICD-10-CM | POA: Diagnosis not present

## 2017-03-26 DIAGNOSIS — H25013 Cortical age-related cataract, bilateral: Secondary | ICD-10-CM | POA: Diagnosis not present

## 2017-03-26 DIAGNOSIS — Z83511 Family history of glaucoma: Secondary | ICD-10-CM | POA: Diagnosis not present

## 2017-03-26 DIAGNOSIS — M069 Rheumatoid arthritis, unspecified: Secondary | ICD-10-CM | POA: Diagnosis not present

## 2017-03-26 DIAGNOSIS — T84621D Infection and inflammatory reaction due to internal fixation device of left femur, subsequent encounter: Secondary | ICD-10-CM | POA: Diagnosis not present

## 2017-03-26 DIAGNOSIS — F329 Major depressive disorder, single episode, unspecified: Secondary | ICD-10-CM | POA: Diagnosis not present

## 2017-03-26 DIAGNOSIS — E1140 Type 2 diabetes mellitus with diabetic neuropathy, unspecified: Secondary | ICD-10-CM | POA: Diagnosis not present

## 2017-03-26 DIAGNOSIS — Z79899 Other long term (current) drug therapy: Secondary | ICD-10-CM | POA: Diagnosis not present

## 2017-03-27 DIAGNOSIS — E1140 Type 2 diabetes mellitus with diabetic neuropathy, unspecified: Secondary | ICD-10-CM | POA: Diagnosis not present

## 2017-03-27 DIAGNOSIS — T84621D Infection and inflammatory reaction due to internal fixation device of left femur, subsequent encounter: Secondary | ICD-10-CM | POA: Diagnosis not present

## 2017-03-27 DIAGNOSIS — S72002D Fracture of unspecified part of neck of left femur, subsequent encounter for closed fracture with routine healing: Secondary | ICD-10-CM | POA: Diagnosis not present

## 2017-03-27 DIAGNOSIS — I5032 Chronic diastolic (congestive) heart failure: Secondary | ICD-10-CM | POA: Diagnosis not present

## 2017-03-27 DIAGNOSIS — F329 Major depressive disorder, single episode, unspecified: Secondary | ICD-10-CM | POA: Diagnosis not present

## 2017-03-27 DIAGNOSIS — M069 Rheumatoid arthritis, unspecified: Secondary | ICD-10-CM | POA: Diagnosis not present

## 2017-03-30 DIAGNOSIS — F329 Major depressive disorder, single episode, unspecified: Secondary | ICD-10-CM | POA: Diagnosis not present

## 2017-03-30 DIAGNOSIS — I5032 Chronic diastolic (congestive) heart failure: Secondary | ICD-10-CM | POA: Diagnosis not present

## 2017-03-30 DIAGNOSIS — S72002D Fracture of unspecified part of neck of left femur, subsequent encounter for closed fracture with routine healing: Secondary | ICD-10-CM | POA: Diagnosis not present

## 2017-03-30 DIAGNOSIS — T84621D Infection and inflammatory reaction due to internal fixation device of left femur, subsequent encounter: Secondary | ICD-10-CM | POA: Diagnosis not present

## 2017-03-30 DIAGNOSIS — E1140 Type 2 diabetes mellitus with diabetic neuropathy, unspecified: Secondary | ICD-10-CM | POA: Diagnosis not present

## 2017-03-30 DIAGNOSIS — M069 Rheumatoid arthritis, unspecified: Secondary | ICD-10-CM | POA: Diagnosis not present

## 2017-03-31 ENCOUNTER — Encounter: Payer: Self-pay | Admitting: *Deleted

## 2017-03-31 ENCOUNTER — Other Ambulatory Visit: Payer: Self-pay | Admitting: *Deleted

## 2017-03-31 NOTE — Patient Outreach (Signed)
[name]) Care Management [place] Telephone Outreach  03/31/2017  [name] Oct 11, 1940 [id number]  Successful telephone outreach to [employer], 76 y.o.femalereferred to [place] after recent hospitalization August 31-January 21, 2017 for (L) hip fracture after fall; Patient was discharged to SNF for rehabilitation and was discharged from [facility] visit on February 13, 2017 with home health [facility]) services through [place]. Patient has history including, but not limited to, essential HTN, HLD, DM Type II, rheumatoid arthritis, depression, CHF, COPD with chronic respiratory failure, morbid obesity, and GERD.   Patient had called me earlier today and left me voicemail message, requesting call back, as she stated in voicemail message that she needed to cancel tomorrow's home visit; I returned patient's call within 2 hours of her original call to me; HIPAA/ identity verified with patient during phone call today.   Patient stated that she needed to cancel tomorrow's home visit, as her daughter is having surgery tomorrow; I shared with patient that she was actually on my schedule for home visit in 2 days, on Thursday 04/02/17 at 12:30 pm, and she expressed understanding, stating she should be there on Thursday for visit.  Patient sounded to be in no apparent/ obvious distress today during brief phone call, and she denied concerns, problems/ issues.  Patient denied further questions/ needs today, stating that "nothing much has changed" with her health and that she "is feeling pretty good" today.  Discussed with patient that if she was unable to keep  Thursday's scheduled home visit appointment, to call me tomorrow, and she agreed to do so.  Otherwise, patient said she would look forward to updating me at time of scheduled home visit this Thursday.  Plan:  [place] CM routine home visit as scheduled for Thursday 04/02/17  Oneta Rack, RN,  BSN, [place] Care Management  [phone]

## 2017-04-01 DIAGNOSIS — T84621D Infection and inflammatory reaction due to internal fixation device of left femur, subsequent encounter: Secondary | ICD-10-CM | POA: Diagnosis not present

## 2017-04-01 DIAGNOSIS — F329 Major depressive disorder, single episode, unspecified: Secondary | ICD-10-CM | POA: Diagnosis not present

## 2017-04-01 DIAGNOSIS — I5032 Chronic diastolic (congestive) heart failure: Secondary | ICD-10-CM | POA: Diagnosis not present

## 2017-04-01 DIAGNOSIS — S72002D Fracture of unspecified part of neck of left femur, subsequent encounter for closed fracture with routine healing: Secondary | ICD-10-CM | POA: Diagnosis not present

## 2017-04-01 DIAGNOSIS — M069 Rheumatoid arthritis, unspecified: Secondary | ICD-10-CM | POA: Diagnosis not present

## 2017-04-01 DIAGNOSIS — E1140 Type 2 diabetes mellitus with diabetic neuropathy, unspecified: Secondary | ICD-10-CM | POA: Diagnosis not present

## 2017-04-02 ENCOUNTER — Other Ambulatory Visit: Payer: Self-pay | Admitting: *Deleted

## 2017-04-02 ENCOUNTER — Encounter: Payer: Self-pay | Admitting: *Deleted

## 2017-04-02 DIAGNOSIS — I5032 Chronic diastolic (congestive) heart failure: Secondary | ICD-10-CM | POA: Diagnosis not present

## 2017-04-02 DIAGNOSIS — F329 Major depressive disorder, single episode, unspecified: Secondary | ICD-10-CM | POA: Diagnosis not present

## 2017-04-02 DIAGNOSIS — T84621D Infection and inflammatory reaction due to internal fixation device of left femur, subsequent encounter: Secondary | ICD-10-CM | POA: Diagnosis not present

## 2017-04-02 DIAGNOSIS — S72002D Fracture of unspecified part of neck of left femur, subsequent encounter for closed fracture with routine healing: Secondary | ICD-10-CM | POA: Diagnosis not present

## 2017-04-02 DIAGNOSIS — M069 Rheumatoid arthritis, unspecified: Secondary | ICD-10-CM | POA: Diagnosis not present

## 2017-04-02 DIAGNOSIS — E1140 Type 2 diabetes mellitus with diabetic neuropathy, unspecified: Secondary | ICD-10-CM | POA: Diagnosis not present

## 2017-04-02 NOTE — Patient Outreach (Signed)
[facility]) Care Management  [place] Routine Home Visit 04/02/2017  [name] 16-Dec-1940 [id number]  [name] is a 76 y.o. female referred to [facility] after recent hospitalization August 31-January 21, 2017 for (L) hip fracture after fall; Patient was discharged to [facility] for [facility] and [facility], 2017 with home health [facility]) services through [place]. Patient has history including, but not limited to, essential HTN, HLD, DM Type II, rheumatoid arthritis, depression, CHF, COPD with chronic respiratory failure, morbid obesity, and GERD. HIPAA/ identity verified with patient in person during home visit today.  Patient's husband and daughter are present in her home for today's home visit, and intermittently participate in visit today.  Pleasant 45 minute home visit.  Today, patient appears visibly better since last [place] home visit, and she is sitting in lift recliner chair with [facility] CM calendar tool in front of her.  Patient reports "minimal" pain today, stating that her pain "has been better" over last few weeks, and is easily managed with prescribed pain medications.  Patient is in no apparent distress throughout entirety of today's home visit.  Subjective: "I am so glad I didn't give up when I was so sick.... things are going so much better now that some time has passed and the house is unpacked and more organized.  I am feeling really good."  Assessment:  [name] appears to have made great strides in her recuperation after her discharge home from SNF, and she reports excellent progress with pain management, CHF self-health management, and mobility.  Patient acknowledges that now that her home has been better organized after her recent move, that she feels less overwhelmed with her overall day to day care routine/ needs.  [name] is committed to compliance with her overall plan of care and continues  actively participating in home health services for PT, OT, and nursing, and has attended all scheduled provider appointments post-SNF discharge.  [name] continues verbalizing a very good understanding of her medications, and could benefit from ongoing reinforcement of self-health management of chronic disease states of CHF/ COPD- ILD/ and DM.  [name] is working with [facility] CSW for [employer] needs, and continues to report a supportive and actively involved family network system, who assist with her care needs as indicated.  Patient further reports:  Medications: -- Has all medicationsand takes as prescribed;denies questions about current medications; verbalizes a good general understanding of the purpose, dosing, and scheduling of her medications; confirms no recent changes to overall medication regimine. -- in-home medication reconciliation performed during home visit today  Home health [facility]) services: -- [place] services for PT, OT, RN, bath aide remain active through [facility] home health agency; reports she was recently "recertified" for [facility] services, and that all disciplines have visited her this week -- verbalizes active participation in all home health services thus far; believes [facility] PT "really helping," stating that she is using her walker "more and more," and her wheelchair "less and less;" patient is very happy with her improvement in mobility thus far  Provider appointments: -- All upcoming previous/ provider appointments were reviewed with patient today; patient verbalizes plans to attend all provider appointments -- reports she will attend pain clinic/ provider: Dr. Tessa Lerner next week as scheduled -- States family will continue to transport her to all provider appointments "for now," confirms working with [facility] CSW for stated community resource needs  Safety/ Mobility/ Falls: -- denies new/ recent falls  post-SNF discharge -- assistive devices: now using wheelchair "only as needed,"  reporting that she usually takes the wheelchair wit her when she goes out for errands/ provider appointments; reports "now mostly using walker," and patient is very happy about her progress in using walker more/ wheelchair less. -- no obvious fall risks/ hazards noted in patient's home today, and she and her family have made significant progress in unpacking home after recent move;  -- patient previously reported intermittent episodes of dizziness, which she states has "now completely resolved," stating that she has not been dizzy "for many weeks now" -- general fall risks/ prevention education reiterated/ reviewed with patient today  [place] needs: -- confirms that she is working with [facility] CSW regarding stated Pharmacologist of CHF, COPD/ ILD/ DM: -- reports improvement in episodes of shortness of breath; acknowledges that she still becomes easily "winded" with any exertion, but reports this "is better" and that she recovers quickly with rest and prn use of home O2 as needed  -- now primarily using home O2 at 2 L/min via [place] "only at night;" reports that she "hardly ever" needs to use during day times hours; patient demonstrates no shortness of breath on room air during today's home visit  -- currently monitoring/ recording daily weights; reviewed all recorded weights with patient today, noting weight loss from previous [place] initial home visit; Patient was encouraged to continue this practice and we reviewed EMMI educational material previously provided on basics of CHF; weight gain guidelines, heart failure/ COPD zones with emphasis on yellow zone and corresponding action plans.  Patient reports "definitely in green zone" today.  -- continues monitoring blood glucose levels once daily, in morning, fasting; reports blood sugar ranges consistently 110-115 fasting; reports no concerns around self-health management of DM today  -- reports that  she has continued monitoring BP's regularly, and now denies episodes of dizziness or other concerns with daily BP measurements.    Patient denies further issues, concerns, or problems today, and I provided/ confirmed that patient hasmy direct phone number, the main [place] office phone number, and the [facility] CM 24-hour nurse advice phone number should issues arise prior to next scheduled [place] outreach by phone in 2 weeks.  Objective:    BP 118/68   Pulse 68   Resp 18   Wt 252 lb (114.3 kg)   SpO2 93%   BMI 43.26 kg/m   Review of Systems  Constitutional: Negative.  Negative for malaise/fatigue.  Respiratory: Negative for cough, sputum production, shortness of breath and wheezing.        Patient reports baseline SOB with exertion that resolves "quickly" with use of home O2 and rest; none demonstrated during home visit today  Cardiovascular: Positive for leg swelling. Negative for chest pain.       Patient wearing bilateral compression stockings; noted baseline bilateral LE swelling at +2, improved from last [place] home visit; patient reports LE swelling "much better" over "last few weeks"  Gastrointestinal: Negative.  Negative for abdominal pain, constipation and nausea.  Genitourinary: Positive for frequency and urgency.       Patient attributes to ongoing diuretic therapy  Musculoskeletal: Positive for joint pain and myalgias. Negative for falls.       Baseline chronic hip/ leg/ knee pain; patient reports "much better recently," with "good days/ bad days," and reports prescribed medications "help"  Neurological: Negative for dizziness and weakness.  Psychiatric/Behavioral: Negative for  depression. The patient is not nervous/anxious.    Physical Exam  Constitutional: She is oriented to person, place, and time. She appears well-developed and well-nourished. No distress.  Cardiovascular: Normal rate, regular rhythm and intact distal pulses.  Heart sounds distant   Respiratory: Effort normal and breath sounds normal. No respiratory distress. She has no wheezes. She has no rales.  Pt. on RA during home visit, and exhibits no SOB, dyspnea, cough  GI: Soft. Bowel sounds are normal.  Musculoskeletal: She exhibits edema.  See ROS  Neurological: She is alert and oriented to person, place, and time.  Skin: Skin is warm and dry. No erythema.  Psychiatric: She has a normal mood and affect. Her behavior is normal. Judgment and thought content normal.   Encounter Medications:   Outpatient Encounter Medications as of 04/02/2017  Medication Sig Note  . acetaminophen (TYLENOL) 500 MG tablet Take 1,000 mg by mouth every 6 (six) hours as needed for moderate pain.    [name] albuterol (PROVENTIL HFA;VENTOLIN HFA) 108 (90 BASE) MCG/ACT inhaler Inhale 2 puffs into the lungs every 6 (six) hours as needed for wheezing or shortness of breath. 02/26/2017: Has not needed recently   . allopurinol (ZYLOPRIM) 300 MG tablet Take 300 mg by mouth daily.  02/26/2017: Reports taking daily (through [place])   . aspirin 325 MG tablet Take 1 tablet (325 mg total) by mouth daily.   [name] atorvastatin (LIPITOR) 20 MG tablet Take 10 mg by mouth every evening.    [name] azithromycin (ZITHROMAX) 500 MG tablet Take 1 tablet by mouth daily. TAKE ON MON, WED & FRI 01/11/2016: Ongoing ABT.   . Calcium Carb-Cholecalciferol (CALCIUM + D3 PO) Take 2 tablets by mouth daily with lunch.    . cholecalciferol (VITAMIN D) 1000 UNITS tablet Take 2,000 Units by mouth daily with lunch.    . cyclobenzaprine (FLEXERIL) 5 MG tablet Take 1 tablet (5 mg total) by mouth at bedtime as needed for muscle spasms.   [name] docusate sodium (COLACE) 100 MG capsule Take 1 capsule (100 mg total) by mouth 2 (two) times daily. (Patient not taking: Reported on 02/26/2017)   . DULoxetine (CYMBALTA) 60 MG capsule Take 1 capsule (60 mg total) by mouth daily.   . fluticasone (FLONASE) 50 MCG/ACT nasal spray Place 2 sprays into both nostrils daily.   .  furosemide (LASIX) 20 MG tablet Take 80 mg by mouth daily.    [name] gabapentin (NEURONTIN) 600 MG tablet Take two in the morning , Two mid- afternoon and two at bedtime (Patient taking differently: Take 1,200 mg by mouth 3 (three) times daily. )   . guaiFENesin (MUCINEX) 600 MG 12 hr tablet Take 1,200 mg by mouth 2 (two) times daily.   . hydroxychloroquine (PLAQUENIL) 200 MG tablet Take 200 mg by mouth daily.   [name] ipratropium-albuterol (DUONEB) 0.5-2.5 (3) MG/3ML SOLN Take 3 mLs by nebulization. Every 6 hours as needed for cough or wheezing   . iron polysaccharides (NIFEREX) 150 MG capsule Take 150 mg by mouth daily.   [name] levothyroxine (SYNTHROID, LEVOTHROID) 50 MCG tablet Take 1 tablet (50 mcg total) by mouth daily.   . metoprolol succinate (TOPROL-XL) 50 MG 24 hr tablet Take 1 tablet (50 mg total) by mouth every morning. Take with or immediately following a meal.   . morphine (MS CONTIN) 15 MG 12 hr tablet Take 1 tablet (15 mg total) by mouth every 12 (twelve) hours.   [name] morphine (MSIR) 15 MG tablet Take 1 tablet (15 mg  total) by mouth every 8 (eight) hours as needed for severe pain.   . Multiple Vitamin (MULTIVITAMIN WITH MINERALS) TABS Take 1 tablet by mouth daily.    [name] olmesartan (BENICAR) 20 MG tablet Take 1 tablet (20 mg total) by mouth daily.   . OXYGEN-HELIUM IN Inhale 2 L into the lungs at bedtime. And on exertion   . pantoprazole (PROTONIX) 40 MG tablet take 1 tablet by mouth once daily MINUTES BEFORE 1ST MEAL OF THE DAY   . polyethylene glycol (MIRALAX / GLYCOLAX) packet Take 17 g by mouth daily.   . potassium chloride (K-DUR,KLOR-CON) 10 MEQ tablet Take 20 mEq by mouth 2 (two) times daily. 02/26/2017: Takes once daily  . predniSONE (DELTASONE) 10 MG tablet Take 10 mg by mouth every morning.    . sodium chloride (OCEAN) 0.65 % SOLN nasal spray Place 1 spray into both nostrils as needed for congestion.   [name] Spacer/Aero-Holding Chambers (AEROCHAMBER MV) inhaler Use as instructed   . VICTOZA 18  MG/3ML SOPN Inject 1.8 mg as directed daily.     No facility-administered encounter medications on file as of 04/02/2017.     Functional Status:   In your present state of health, do you have any difficulty performing the following activities: 02/26/2017 02/16/2017  Hearing? N -  Vision? N -  Difficulty concentrating or making decisions? - N  Walking or climbing stairs? - Y  Dressing or bathing? - N  Doing errands, shopping? - Y  Comment - reports family transports to errands, appointments  Preparing Food and eating ? Y -  Comment Family assists with meal preparation; patient reports no issues with eating -  Using the Toilet? N -  In the past six months, have you accidently leaked urine? Y -  Comment Patient reports wears depends -  Do you have problems with loss of bowel control? N -  Managing your Medications? - N  Managing your Finances? N -  Housekeeping or managing your Housekeeping? Y -  Some recent data might be hidden    Fall/Depression Screening:    Fall Risk  04/02/2017 03/18/2017 03/04/2017  Falls in the past year? (No Data) (No Data) (No Data)  Comment Patient denies new falls today No new falls reported today by patient No new falls reported by patient today  Number falls in past yr: - - -  Injury with Fall? - - -  Risk Factor Category  - - -  Risk for fall due to : - - -  Risk for fall due to: Comment - - -  Follow up Falls prevention discussed - -  Comment - - -   PHQ 2/9 Scores 03/10/2017 03/02/2017 02/26/2017 11/28/2016 09/04/2016 08/09/2015 01/05/2015  PHQ - 2 Score 0 0 0 0 0 4 3  PHQ- 9 Score - - - - - - 10   Plan:  Patient will take medications as prescribed and will attend all provider appointments  Patient will promptly notify her providers of any new concerns, issues or problems that arise  Patient will continue actively participate in home health services as ordered post-SNF discharge  Patient will maintain contact with [facility] CSW re:  statedcommunity resource needs for in-home care assistance for ADL's and for transportation  Patient will continue monitoring and recording daily weights, BP's blood sugars and SaO2 values  [facility] CM outreach to continue with scheduled [facility] RN CCM phone call in 2 weeks   [facility] CM Care Plan Problem Two  Most Recent Value  Care Plan Problem Two  Need for reinforcement of self-health management strategies for chronic disease states of CHF, COPD, and DM  Role Documenting the Problem Two  Care Management Coordinator  Care Plan for Problem Two  Active  Interventions for Problem Two Long Term Goal   Routine home visit completed,  using teachback method, reviewed with patient previously provided education around self-health management of chronic disease state of CHF,  reviewed patient's recent daily weights and use of home O2 with her in person,  reviewed overall clinical status,  provider apointments and medications,  provided new [facility] CM patient calendar tool for 2019  [facility] Long Term Goal  Over the next 50 days, patient will be able to verbalize 3 strategies for self-health management of CHF/ COPD, as evidenced by patient reporting of same during [place] outreach  [place] Term Goal Start Date  02/26/17  [facility] CM Short Term Goal #1   Over the next 30 days, patient will continue monitoring and recording daily weights, as evidenced by patient reporting of same during [facility] RN CCM outreach, and visual review of patient's recorded weights during next [facility] RN CCM home visit  [facility] CM Short Term Goal #1 Start Date  04/02/17  Interventions for Short Term Goal #2   Using teachback method, reviewed with patient recently recorded weights and reinforced weight gain guidelines in setting of CHF self-health management  [facility] CM Short Term Goal #2   Over the next 30 days, patient will attend all scheduled provider appointments and will notify providers for any new concerns, weight gain, issues, or problems, as  evidenced by patient reporting of same during [place] outreach/ review of EMR  [facility] CM Short Term Goal #2 Start Date  04/02/17  Interventions for Short Term Goal #2  Using teachback method, reviewed all upcoming provider appointments with patient and confirmed that her family will transport her,  assisted patient in adding provider appointments to [facility] CM patient calendar tool, and reinforced need for patient to contact providers promptly for any new concerns she experiences     Oneta Rack, RN, BSN, [employer] Coordinator [facility] Care Management  [phone]

## 2017-04-03 DIAGNOSIS — S72002D Fracture of unspecified part of neck of left femur, subsequent encounter for closed fracture with routine healing: Secondary | ICD-10-CM | POA: Diagnosis not present

## 2017-04-03 DIAGNOSIS — M069 Rheumatoid arthritis, unspecified: Secondary | ICD-10-CM | POA: Diagnosis not present

## 2017-04-03 DIAGNOSIS — F329 Major depressive disorder, single episode, unspecified: Secondary | ICD-10-CM | POA: Diagnosis not present

## 2017-04-03 DIAGNOSIS — I5032 Chronic diastolic (congestive) heart failure: Secondary | ICD-10-CM | POA: Diagnosis not present

## 2017-04-03 DIAGNOSIS — T84621D Infection and inflammatory reaction due to internal fixation device of left femur, subsequent encounter: Secondary | ICD-10-CM | POA: Diagnosis not present

## 2017-04-03 DIAGNOSIS — E1140 Type 2 diabetes mellitus with diabetic neuropathy, unspecified: Secondary | ICD-10-CM | POA: Diagnosis not present

## 2017-04-06 ENCOUNTER — Encounter: Payer: Medicare Other | Attending: Physical Medicine & Rehabilitation | Admitting: Physical Medicine & Rehabilitation

## 2017-04-06 ENCOUNTER — Other Ambulatory Visit: Payer: Self-pay

## 2017-04-06 ENCOUNTER — Encounter: Payer: Self-pay | Admitting: Physical Medicine & Rehabilitation

## 2017-04-06 DIAGNOSIS — F329 Major depressive disorder, single episode, unspecified: Secondary | ICD-10-CM | POA: Diagnosis not present

## 2017-04-06 DIAGNOSIS — M1711 Unilateral primary osteoarthritis, right knee: Secondary | ICD-10-CM | POA: Diagnosis not present

## 2017-04-06 DIAGNOSIS — E1142 Type 2 diabetes mellitus with diabetic polyneuropathy: Secondary | ICD-10-CM | POA: Diagnosis not present

## 2017-04-06 DIAGNOSIS — M1712 Unilateral primary osteoarthritis, left knee: Secondary | ICD-10-CM

## 2017-04-06 DIAGNOSIS — M47816 Spondylosis without myelopathy or radiculopathy, lumbar region: Secondary | ICD-10-CM | POA: Diagnosis not present

## 2017-04-06 DIAGNOSIS — Z9889 Other specified postprocedural states: Secondary | ICD-10-CM | POA: Insufficient documentation

## 2017-04-06 DIAGNOSIS — M48062 Spinal stenosis, lumbar region with neurogenic claudication: Secondary | ICD-10-CM | POA: Diagnosis not present

## 2017-04-06 DIAGNOSIS — E1122 Type 2 diabetes mellitus with diabetic chronic kidney disease: Secondary | ICD-10-CM | POA: Diagnosis not present

## 2017-04-06 DIAGNOSIS — Z90710 Acquired absence of both cervix and uterus: Secondary | ICD-10-CM | POA: Insufficient documentation

## 2017-04-06 DIAGNOSIS — M069 Rheumatoid arthritis, unspecified: Secondary | ICD-10-CM | POA: Insufficient documentation

## 2017-04-06 MED ORDER — MORPHINE SULFATE 15 MG PO TABS: 15.0000 mg | ORAL_TABLET | Freq: Three times a day (TID) | ORAL | 0 refills | Status: DC | PRN

## 2017-04-06 MED ORDER — MORPHINE SULFATE ER 15 MG PO TBCR: 15.0000 mg | EXTENDED_RELEASE_TABLET | Freq: Two times a day (BID) | ORAL | 0 refills | Status: DC

## 2017-04-06 MED ORDER — DICLOFENAC SODIUM 1 % TD GEL: 1.0000 "application " | Freq: Three times a day (TID) | TRANSDERMAL | 4 refills | Status: DC

## 2017-04-06 NOTE — Patient Instructions (Signed)
Please avoid sleeping with your right elbow bands.  Try to sleep with your right arm outstretched if possible.  Same applies for your right leg.  Stretch the leg if you are sitting for long periods of time try to shift her weight to the other side also.  Sometimes some simple stretches of the hip can be helpful  PLEASE FEEL FREE TO CALL OUR OFFICE WITH ANY PROBLEMS OR QUESTIONS ([phone])

## 2017-04-06 NOTE — Progress Notes (Signed)
Subjective:    Patient ID: [name], female    DOB: 11-Nov-1940, 76 y.o.   MRN: [id number]  HPI   [name] is here in follo wup of her chronic pain. She broke her left hip in July, and the hip became infected with MRSA which required re-exploration in September. She has been participating in [facility] threrapies and is currently still involved with it.   She has lost further weight and is down to about 260lbs. Her goal is to get below 250lbs.   Her breathing is generally stable although she gets short winded at times. Her left leg still has some swelling since the surgical interventions.   Her bowels and bladder are working fairly regularly. She empties her bladder quite a bit with lasix.   Pain Inventory Average Pain 3 Pain Right Now 3 My pain is intermittent, sharp, burning and aching  In the last 24 hours, has pain interfered with the following? General activity 7 Relation with others 7 Enjoyment of life 8 What TIME of day is your pain at its worst? night Sleep (in general) Fair  Pain is worse with: walking, bending, inactivity, standing and some activites Pain improves with: rest, heat/ice, pacing activities and medication Relief from Meds: 3  Mobility use a walker ability to climb steps?  no do you drive?  no use a wheelchair Do you have any goals in this area?  no  Function retired  Neuro/Psych bladder control problems numbness tingling anxiety  Prior Studies Any changes since last visit?  no  Physicians involved in your care Any changes since last visit?  no   Family History  Problem Relation Age of Onset  . Diabetes Mother   . Heart attack Mother   . Hypertension Father   . Lung cancer Father   . Diabetes Sister   . Diabetes Brother   . Hypertension Brother   . Heart disease Brother   . Heart attack Brother   . Kidney cancer Brother   . Uterine cancer Daughter   . Breast cancer Sister   . Rheum arthritis Maternal Uncle   . Gout Brother     . Kidney failure Brother        x 5   Social History   Socioeconomic History  . Marital status: Married    Spouse name: Not on file  . Number of children: 6  . Years of education: college  . Highest education level: Not on file  Social Needs  . Financial resource strain: Not on file  . Food insecurity - worry: Not on file  . Food insecurity - inability: Not on file  . Transportation needs - medical: Not on file  . Transportation needs - non-medical: Not on file  Occupational History  . Occupation: retired Marine scientist  Tobacco Use  . Smoking status: Never Smoker  . Smokeless tobacco: Never Used  Substance and Sexual Activity  . Alcohol use: No  . Drug use: No  . Sexual activity: Not Currently  Other Topics Concern  . Not on file  Social History Narrative   Patient consumes 2-3 cups coffee per day.   Past Surgical History:  Procedure Laterality Date  . ABDOMINAL HYSTERECTOMY    . BRAIN SURGERY     Gamma knife 10/13. Needs repeat spring  '14  . ESOPHAGOGASTRODUODENOSCOPY (EGD) WITH PROPOFOL N/A 09/14/2014   Performed by [name], MD at [place]  . INTRAMEDULLARY (IM) NAIL INTERTROCHANTRIC Left 11/29/2016   Performed by Ninfa Linden,  Lind Guest, MD at [place]  . IRRIGATION AND DEBRIDEMENT LEFT HIP Left 01/16/2017   Performed by [name], MD at [facility] ORS  . OVARY SURGERY    . Right/Left Heart Cath and Coronary Angiography N/A 10/31/2014   Performed by [name], MD at [place] CV LAB  . SHOULDER SURGERY Left   . TONSILLECTOMY  age 37  . VIDEO BRONCHOSCOPY WITHOUT FLUORO Bilateral 05/31/2013   Performed by Brand Males, MD at [facility] ENDOSCOPY  . video bronscoscopy  2000   lung   Past Medical History:  Diagnosis Date  . Acute on chronic diastolic (congestive) heart failure ([place])   . Anemia    iron deficiency anemia - secondary to blood loss ( chronic)   . Arthritis    endstage changes bilateral knees/bilateral ankles.   . Asthma   .  Carotid artery occlusion   . Chronic fatigue   . Chronic kidney disease   . Closed left hip fracture ([place])   . Clotting disorder ([place])    pt denies this  . Contusion of left knee    due to fall 1/14.  [name] COPD (chronic obstructive pulmonary disease) (HCC)    pulmonary fibrosis  . Depression, reactive   . Diabetes mellitus    type II   . Diastolic dysfunction   . Difficulty in walking   . Family history of heart disease   . Generalized muscle weakness   . Gout   . High cholesterol   . History of falling   . Hypertension   . Hypothyroidism   . Interstitial lung disease ([place])   . 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    . Morbid obesity ([place])   . Morbid obesity ([place])   . Neuromuscular disorder ([place])    diabetic neuropathy   . Normal coronary arteries    cardiac catheterization performed  10/31/14  . RA (rheumatoid arthritis) ([place])    has been off methotreaxte since 10/13.  [name] Rheumatoid arthritis ([place])   . Shortness of breath   . Spinal stenosis of lumbar region   . Thyroid disease   . Unspecified lack of coordination   . URI (upper respiratory infection)    There were no vitals taken for this visit.  Opioid Risk Score:  0 Fall Risk Score:  `1  Depression screen PHQ 2/9  Depression screen [facility] 2/9 04/06/2017 03/10/2017 03/02/2017 02/26/2017 11/28/2016 09/04/2016 08/09/2015  Decreased Interest 1 0 0 0 0 0 3  Down, Depressed, Hopeless 1 0 0 0 0 0 1  PHQ - 2 Score 2 0 0 0 0 0 4  Altered sleeping - - - - - - -  Tired, decreased energy - - - - - - -  Change in appetite - - - - - - -  Feeling bad or failure about yourself  - - - - - - -  Trouble concentrating - - - - - - -  Moving slowly or fidgety/restless - - - - - - -  Suicidal thoughts - - - - - - -  PHQ-9 Score - - - - - - -  Difficult doing work/chores - - - - - - -  Some recent data might be hidden      Review of Systems    Constitutional: Positive for unexpected weight change.  HENT: Negative.   Eyes: Negative.   Respiratory: Positive for  shortness of breath.   Cardiovascular: Positive for leg swelling.  Gastrointestinal: Positive for constipation.  Endocrine: Negative.   Genitourinary: Negative.   Musculoskeletal: Negative.   Skin: Negative.   Allergic/Immunologic: Negative.   Neurological: Negative.   Hematological: Negative.   Psychiatric/Behavioral: Negative.        Objective:   Physical Exam  General: Alert and oriented x 3, No apparent distress.does appear to have lost some weight  HEENT:Head is normocephalic, atraumatic, PERRLA, EOMI, sclera anicteric, oral mucosa pink and moist, dentition intact, ext ear canals clear,  Neck:Supple without JVD or lymphadenopathy  Heart:RRR Chest:clear  Abdomen:Soft, non-tender, non-distended, bowel sounds positive.  Extremities:No clubbing, cyanosis, 2+ edema. Pulses are 2+  Skin:wound about 3cm in diameter left shin.  Neuro:Pt is cognitively appropriate with normal insight, memory, and awareness. Cranial nerves 2-12 are intact. Sensory exam is near normal in both legs... Reflexes are 2+ in all 4's. Fine motor coordination is intact. No tremors. Motor function is grossly 4 to 5/5 with pain inhbition.  Musculoskeletal: Ambulates with left-sided antalgia slightly wide-based gait.  Does  appear to be generally stable.  Transfers fairly easily.. Full ROM at left knee. Valgus deformity in standing right knee. Gait is generally antalgic. Fingers lack flexion especially at DIP's. No catches or nodules palpated along volar aspects of fingers.  Tinel's sign positive at right ulnar groove. Psych:Pt's affect is appropriate. Pt is cooperative   Assessment & Plan:  Assessment:  1. Lumbar spinal stenosis with neurogenic claudication. Associated facet arthropathy  2. Depression.  3. Diabetes mellitus type 2 with polyneuropathy  4. Rheumatoid arthritis and  osteoarthritis. Multiple joint involvement including knees.  5. Left greater troch bursitis  6. Interstitial lung disease  7. Hx of left hip fracture, post-op infection in July 2017.    Plan:  1. Continue HH therapies per Ortho. May need outpt therapies to maximize strength and balance.  Discussed some strategies that she could work on that at home currently.  2. Voltaren gel for fingers/knees. Consider injection right knee. Had recent steroid injection by [place]   3. MSO4 for breakthrough pain, one q8 prn #90--  -MS Contin 28m CR q12 #60.  -We will continue the opioid monitoring program, this consists of regular clinic visits, examinations, routine drug screening, pill counts as well [place].   4 . Continue with wound care per wound care clinic. 5. Continue cymbalta  687mdaily. Consider decreasing gabapentin slighlty also next year. 6. Follow up with NP in 1 months. 15 minutes of face to face patient care time were spent during this visit. All questions were encouraged and answered.

## 2017-04-07 DIAGNOSIS — S72002D Fracture of unspecified part of neck of left femur, subsequent encounter for closed fracture with routine healing: Secondary | ICD-10-CM | POA: Diagnosis not present

## 2017-04-07 DIAGNOSIS — I5032 Chronic diastolic (congestive) heart failure: Secondary | ICD-10-CM | POA: Diagnosis not present

## 2017-04-07 DIAGNOSIS — E1140 Type 2 diabetes mellitus with diabetic neuropathy, unspecified: Secondary | ICD-10-CM | POA: Diagnosis not present

## 2017-04-07 DIAGNOSIS — F329 Major depressive disorder, single episode, unspecified: Secondary | ICD-10-CM | POA: Diagnosis not present

## 2017-04-07 DIAGNOSIS — T84621D Infection and inflammatory reaction due to internal fixation device of left femur, subsequent encounter: Secondary | ICD-10-CM | POA: Diagnosis not present

## 2017-04-07 DIAGNOSIS — M069 Rheumatoid arthritis, unspecified: Secondary | ICD-10-CM | POA: Diagnosis not present

## 2017-04-08 ENCOUNTER — Other Ambulatory Visit: Payer: Self-pay | Admitting: Licensed Clinical Social Worker

## 2017-04-08 DIAGNOSIS — E1140 Type 2 diabetes mellitus with diabetic neuropathy, unspecified: Secondary | ICD-10-CM | POA: Diagnosis not present

## 2017-04-08 DIAGNOSIS — S72002D Fracture of unspecified part of neck of left femur, subsequent encounter for closed fracture with routine healing: Secondary | ICD-10-CM | POA: Diagnosis not present

## 2017-04-08 DIAGNOSIS — F329 Major depressive disorder, single episode, unspecified: Secondary | ICD-10-CM | POA: Diagnosis not present

## 2017-04-08 DIAGNOSIS — T84621D Infection and inflammatory reaction due to internal fixation device of left femur, subsequent encounter: Secondary | ICD-10-CM | POA: Diagnosis not present

## 2017-04-08 DIAGNOSIS — M069 Rheumatoid arthritis, unspecified: Secondary | ICD-10-CM | POA: Diagnosis not present

## 2017-04-08 DIAGNOSIS — I5032 Chronic diastolic (congestive) heart failure: Secondary | ICD-10-CM | POA: Diagnosis not present

## 2017-04-08 NOTE — Patient Outreach (Signed)
[place] [facility]) Care Management  04/08/2017  [name] Oct 16, 1940 [id number]  Assessment- CSW completed outreach call to patient on 04/08/17. Patient answered. Patient shares that she the last 2 days have been difficult for her due to feeling congested and experiencing SOB. She reports that she was not able to participate much in PT yesterday due to this. However, she has been using her oxygen more and is feeling a lot better today. Patient reports that PT and her Nurse Aide will come by today as well. CSW completed brief review of personal care resources again over phone call. CSW questioned if she had received completed Aide and Attendance paperwork from doctor and she declined. CSW informed her that she will contact her PCP office to gain update on this as patient cannot schedule appointment with [place] specialist until documents are completed. Patient very appreciative of this assistance. CSW completed call to PCP office and spoke to staff. Aide and Attendance paperwork is expected to be completed by today. CSW was informed that patient has appointment today and that completed paperwork can be provided to her then.   [facility] CM Care Plan Problem One     Most Recent Value  Care Plan Problem One  Lack of care within the home and need for community resource education  Role Documenting the Problem One  Clinical Social Worker  Care Plan for Problem One  Active  THN CM Short Term Goal #1   Patient will gain personal care resource education within 30 days  THN CM Short Term Goal #1 Start Date  03/02/17  [facility] CM Short Term Goal #1 Met Date  04/08/17  Interventions for Short Term Goal #1  Goal completed. Resource review completed again  [facility] CM Short Term Goal #2   Patient will schedule appointment with [place] specialist within 30 days  THN CM Short Term Goal #2 Start Date  04/08/17  Interventions for Short Term Goal #2  Patient is still waiting on documents to be signed by doctor. Goal will  be continued as [facility] until [facility] are received.     Plan-CSW will follow up within 3 weeks. CSW will continue provide social work assistance.  [name], BSW, MSW, [email or web address] Phone: [phone] Fax: [phone]

## 2017-04-13 DIAGNOSIS — I5032 Chronic diastolic (congestive) heart failure: Secondary | ICD-10-CM | POA: Diagnosis not present

## 2017-04-13 DIAGNOSIS — M069 Rheumatoid arthritis, unspecified: Secondary | ICD-10-CM | POA: Diagnosis not present

## 2017-04-13 DIAGNOSIS — F329 Major depressive disorder, single episode, unspecified: Secondary | ICD-10-CM | POA: Diagnosis not present

## 2017-04-13 DIAGNOSIS — T84621D Infection and inflammatory reaction due to internal fixation device of left femur, subsequent encounter: Secondary | ICD-10-CM | POA: Diagnosis not present

## 2017-04-13 DIAGNOSIS — E1140 Type 2 diabetes mellitus with diabetic neuropathy, unspecified: Secondary | ICD-10-CM | POA: Diagnosis not present

## 2017-04-13 DIAGNOSIS — S72002D Fracture of unspecified part of neck of left femur, subsequent encounter for closed fracture with routine healing: Secondary | ICD-10-CM | POA: Diagnosis not present

## 2017-04-14 ENCOUNTER — Other Ambulatory Visit: Payer: Self-pay | Admitting: Licensed Clinical Social Worker

## 2017-04-14 DIAGNOSIS — S72002D Fracture of unspecified part of neck of left femur, subsequent encounter for closed fracture with routine healing: Secondary | ICD-10-CM | POA: Diagnosis not present

## 2017-04-14 DIAGNOSIS — F329 Major depressive disorder, single episode, unspecified: Secondary | ICD-10-CM | POA: Diagnosis not present

## 2017-04-14 DIAGNOSIS — T84621D Infection and inflammatory reaction due to internal fixation device of left femur, subsequent encounter: Secondary | ICD-10-CM | POA: Diagnosis not present

## 2017-04-14 DIAGNOSIS — I5032 Chronic diastolic (congestive) heart failure: Secondary | ICD-10-CM | POA: Diagnosis not present

## 2017-04-14 DIAGNOSIS — E1140 Type 2 diabetes mellitus with diabetic neuropathy, unspecified: Secondary | ICD-10-CM | POA: Diagnosis not present

## 2017-04-14 DIAGNOSIS — M069 Rheumatoid arthritis, unspecified: Secondary | ICD-10-CM | POA: Diagnosis not present

## 2017-04-14 NOTE — Patient Outreach (Signed)
[name]) Care Management  04/14/2017  [name] 19-Aug-1940 [id number]  Assessment- CSW received incoming call from patient. Patient reports that she just received signed [facility] paperwork from PCP and wanted to follow up with CSW on the next steps. CSW completed call on another line to [place]. CSW was unable to reach staff but left a message encouraging a return call to BOTH patient and CSW in order to schedule an appointment to apply for Aide and Attendance benefits. CSW encouraged patient to contact CSW back in a week if no return call has been made.  Plan-CSW will follow up within one week.  [name], BSW, MSW, [email or web address] Phone: [phone] Fax: [phone]

## 2017-04-15 ENCOUNTER — Other Ambulatory Visit: Payer: Self-pay | Admitting: *Deleted

## 2017-04-15 ENCOUNTER — Other Ambulatory Visit: Payer: Self-pay | Admitting: Licensed Clinical Social Worker

## 2017-04-15 ENCOUNTER — Encounter: Payer: Self-pay | Admitting: *Deleted

## 2017-04-15 DIAGNOSIS — E1140 Type 2 diabetes mellitus with diabetic neuropathy, unspecified: Secondary | ICD-10-CM | POA: Diagnosis not present

## 2017-04-15 DIAGNOSIS — I5032 Chronic diastolic (congestive) heart failure: Secondary | ICD-10-CM | POA: Diagnosis not present

## 2017-04-15 DIAGNOSIS — S72002D Fracture of unspecified part of neck of left femur, subsequent encounter for closed fracture with routine healing: Secondary | ICD-10-CM | POA: Diagnosis not present

## 2017-04-15 DIAGNOSIS — F329 Major depressive disorder, single episode, unspecified: Secondary | ICD-10-CM | POA: Diagnosis not present

## 2017-04-15 DIAGNOSIS — T84621D Infection and inflammatory reaction due to internal fixation device of left femur, subsequent encounter: Secondary | ICD-10-CM | POA: Diagnosis not present

## 2017-04-15 DIAGNOSIS — M069 Rheumatoid arthritis, unspecified: Secondary | ICD-10-CM | POA: Diagnosis not present

## 2017-04-15 NOTE — Patient Outreach (Signed)
[place] [facility]) Care Management [place] Telephone Outreach  04/15/2017  [name] 15-Sep-1940 [id number]  Successful telephone outreach to [name], 76 y.o. female referred to [place] after recent hospitalization August 31-January 21, 2017 for (L) hip fracture after fall; Patient was discharged to [facility] for rehabilitation and was discharged from [facility] visit on February 13, 2017 with home health [facility]) services through [place]. Patient has history including, but not limited to, essential HTN, HLD, DM Type II, rheumatoid arthritis, depression, CHF, COPD with chronic respiratory failure, morbid obesity, and GERD. HIPAA/ identity verified with patientduring phone call today.  Today, patient reports she "is doing pretty good," but states that she "isn't as good" as when I visited with her last at her home on April 02, 2017; patient states that she "may have eaten too much salt" over holiday, and reports weight gain, increased fatigue, and decreased activity tolerance "over the last few days."  Patient sounds to be in no apparent/ obvious distress throughout entirety of today's phone call.  Patient further reports:  Medications: -- Has all medicationsand is taking as prescribed;denies questions about current medications;confirms no recent changes to overall medication regimine.  [facility]) services: -- [place] services for PT, OT, and bath aide remain active through [facility] agency; reportsshe was visited by [facility] RN yesterday and "was released" from [facility] RN services; reports [facility] PT remains active and will be visiting her this afternoon. --verbalizes active participation in all home health services thus far  Provider appointments: -- All upcoming/ previous provider appointments were reviewed with patient today; patient verbalizes plans to attend all upcoming provider appointments -- reports she attended pain clinic/ provider: Dr.  Tessa Lerner on April 06, 2017 as scheduled, denies changes to overall plan of care as a result of visit -- States family will continue to transport her to all provider appointments"for now," confirms continues to work with [facility] CSW for stated community resource needs  Safety/ Mobility/ Falls: -- denies new/ recent falls  -- assistive devices: continues usingwheelchair "only as needed," and "is mainly using walker and cane;" however, reports that over last several days, she has become increasingly fatigued during day time hours and is "using wheelchair more often." -- general fall risks/ prevention educationreiterated/ reviewed with patient today  Self-health management of CHF, COPD/ ILD/ DM: --reports "some increased" episodes of shortness of breath with activity; acknowledges that she becomes easily"winded" with any exertion as her baseline, but reports this "isn't as good as it was before Thanksgiving;" again reports she recovers quickly with rest and prn use of home O2 as needed  -- continues primarily using home O2 at 2 L/min via [place] "only at night;" but reports today that she has also been using during day times hours more over last few days around her increased activity intolerance  -- continues monitoring/ recording daily weights; reports "weight gain" over last week, and states that her weight this morning was "263 lbs;"  Reviewed patient's stated weight from last [place] visit 04/02/17, and noted that she had reported a weight at that time of 252 lbs; patient reports that she did not "gain all that weight all at once;" states "it happened gradually" since last [place] home visit.  Reiterated weight gain guidelines in setting of CHF, heart failure/ COPD zones with emphasis on yellow zone and corresponding action plans, and encouraged patient to promptly notify her PCP/ pulmonology providers of recent weight gain, which she agrees  to do.  Today, patient is able to verbalize  appropriate strategies and action plans for self-health management of chronic disease state of CHF  -- continues monitoring blood glucose levels once daily, in morning, fasting; reports blood sugar ranges consistently 110-130 fasting; reports no concerns around self-health management of DM today; denies dizziness or lightheadedness as remotely reported after SNF discharge  -- reports that she has continued monitoring BP's regularly, and denies episodes of dizziness or other concerns with daily BP measurements.    Patient denies further issues, concerns, or problems today, and I confirmed that patient hasmy direct phone number, the main [place] office phone number, and the [facility] CM 24-hour nurse advice phone number should issues arise prior to next scheduled [place] outreach with routine home visit in 2 weeks.  Discussed that patient has made progress in meeting her previously established THN CM goals, and briefly discussed possibility of THN CM case closure in the future with patient, depending on her progress/ development of new goals, and patient is agreeable to this plan.  Plan:  Patient will take medications as prescribed and will attend all provider appointments  Patient will promptly notify her providers of any new concerns, issues or problems that arise, and will make her care providers aware of her recent weight gain  Patient will continue actively participate in home health services as ordered post-SNF discharge  Patient will maintain contact with [facility] CSW re: statedcommunity resource needs for in-home care assistance for ADL's and for transportation  Patient will continuemonitoring and recording daily weights, BP's blood sugars and SaO2 values  THN Community CM outreach to continue [place] visit in 2weeks  [facility] CM Care Plan Problem Two     Most Recent Value  Care Plan Problem Two  Need for reinforcement of self-health management strategies for  chronic disease states of CHF, COPD, and DM  Role Documenting the Problem Two  Care Management Coordinator  Care Plan for Problem Two  Active  Interventions for Problem Two Long Term Goal   Discussed currentl clinical status with patient and reinforced previously provided education of self-health management of CHF,  using teachback, verified that patient is able to accurately report strategies for self-health management of CHF  THN Long Term Goal  Over the next 50 days, patient will be able to verbalize 3 strategies for self-health management of CHF/ COPD, as evidenced by patient reporting of same during [place] outreach  [place] Term Goal Start Date  02/26/17  THN Long Term Goal Met Date  04/15/17  THN CM Short Term Goal #1   Over the next 30 days, patient will continue monitoring and recording daily weights, as evidenced by patient reporting of same during [facility] RN CCM outreach, and visual review of patient's recorded weights during next [facility] RN CCM home visit  THN CM Short Term Goal #1 Start Date  04/02/17  Interventions for Short Term Goal #2   Reviewed with patient during phone call recently recorded weights and confirmed that patient continues monitoring and recording daily weights  THN CM Short Term Goal #2   Over the next 30 days, patient will attend all scheduled provider appointments and will notify providers for any new concerns, weight gain, issues, or problems, as evidenced by patient reporting of same during [place] outreach/ review of EMR  THN CM Short Term Goal #2 Start Date  04/02/17  Interventions for Short Term Goal #2  Discussed with patient need to  contact PCP/ pulmonology providers for recent weight gain and intermittent symptoms reported today of patient being in yellow zone,  reinforced previously provided education around CHF zones, and encouraged patient to promptly contact providers for any new concerns     Oneta Rack, RN, BSN, [employer]  Coordinator [facility] Care Management  [phone]

## 2017-04-15 NOTE — Patient Outreach (Signed)
[place] [facility]) Care Management  04/15/2017  [name] 08-16-1940 [id number]  Assessment- CSW has not heard back from DSS to follow up on [facility] benefits. CSW completed call to [name] with DSS and left a HIPPA compliant voice message encouraging a return call in order to set up appointment for patient to apply for Aide and Attendance program.  Plan-CSW will await for return call from [place] or follow up within one week.  [name], BSW, MSW, [name] Phone: [phone] Fax: [phone]

## 2017-04-16 DIAGNOSIS — N183 Chronic kidney disease, stage 3 (moderate): Secondary | ICD-10-CM | POA: Diagnosis not present

## 2017-04-16 DIAGNOSIS — F329 Major depressive disorder, single episode, unspecified: Secondary | ICD-10-CM | POA: Diagnosis not present

## 2017-04-16 DIAGNOSIS — E1122 Type 2 diabetes mellitus with diabetic chronic kidney disease: Secondary | ICD-10-CM | POA: Diagnosis not present

## 2017-04-16 DIAGNOSIS — M069 Rheumatoid arthritis, unspecified: Secondary | ICD-10-CM | POA: Diagnosis not present

## 2017-04-16 DIAGNOSIS — I129 Hypertensive chronic kidney disease with stage 1 through stage 4 chronic kidney disease, or unspecified chronic kidney disease: Secondary | ICD-10-CM | POA: Diagnosis not present

## 2017-04-16 DIAGNOSIS — I5032 Chronic diastolic (congestive) heart failure: Secondary | ICD-10-CM | POA: Diagnosis not present

## 2017-04-16 DIAGNOSIS — E1140 Type 2 diabetes mellitus with diabetic neuropathy, unspecified: Secondary | ICD-10-CM | POA: Diagnosis not present

## 2017-04-16 DIAGNOSIS — T84621D Infection and inflammatory reaction due to internal fixation device of left femur, subsequent encounter: Secondary | ICD-10-CM | POA: Diagnosis not present

## 2017-04-16 DIAGNOSIS — S72002D Fracture of unspecified part of neck of left femur, subsequent encounter for closed fracture with routine healing: Secondary | ICD-10-CM | POA: Diagnosis not present

## 2017-04-17 DIAGNOSIS — F329 Major depressive disorder, single episode, unspecified: Secondary | ICD-10-CM | POA: Diagnosis not present

## 2017-04-17 DIAGNOSIS — E1140 Type 2 diabetes mellitus with diabetic neuropathy, unspecified: Secondary | ICD-10-CM | POA: Diagnosis not present

## 2017-04-17 DIAGNOSIS — S72002D Fracture of unspecified part of neck of left femur, subsequent encounter for closed fracture with routine healing: Secondary | ICD-10-CM | POA: Diagnosis not present

## 2017-04-17 DIAGNOSIS — I5032 Chronic diastolic (congestive) heart failure: Secondary | ICD-10-CM | POA: Diagnosis not present

## 2017-04-17 DIAGNOSIS — T84621D Infection and inflammatory reaction due to internal fixation device of left femur, subsequent encounter: Secondary | ICD-10-CM | POA: Diagnosis not present

## 2017-04-17 DIAGNOSIS — M069 Rheumatoid arthritis, unspecified: Secondary | ICD-10-CM | POA: Diagnosis not present

## 2017-04-20 ENCOUNTER — Other Ambulatory Visit: Payer: Self-pay | Admitting: Licensed Clinical Social Worker

## 2017-04-20 NOTE — Patient Outreach (Signed)
[facility]) Care Management  04/20/2017  [name] [birth year] [id number]  Assessment- CSW will completed outreach call to patient to follow up on [place]. Patient answered. She reports that the [place] specialist did contact her back and that they are currently working on the Aide and Attendance application process. She reports that she faxed the needed paperwork to [place] specialist and is waiting to hear back to ensure they received the documents. CSW has their contact numbers and will contact them again today to question this. CSW encouraged patient to contact her if she has any issues or concerns.  Plan-CSW will follow up within two weeks and continue to provide social work assistance and support.  [name], BSW, MSW, [name] Phone: [phone] Fax: [phone]

## 2017-04-27 ENCOUNTER — Encounter: Payer: Self-pay | Admitting: *Deleted

## 2017-04-27 ENCOUNTER — Other Ambulatory Visit: Payer: Self-pay | Admitting: *Deleted
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
## 2017-04-28 ENCOUNTER — Ambulatory Visit: Payer: Self-pay | Admitting: *Deleted

## 2017-05-01 DIAGNOSIS — R531 Weakness: Secondary | ICD-10-CM | POA: Diagnosis not present

## 2017-05-07 ENCOUNTER — Other Ambulatory Visit: Payer: Self-pay

## 2017-05-07 ENCOUNTER — Other Ambulatory Visit: Payer: Self-pay | Admitting: Licensed Clinical Social Worker

## 2017-05-07 ENCOUNTER — Encounter: Payer: Self-pay | Admitting: Registered Nurse

## 2017-05-07 ENCOUNTER — Encounter: Payer: Medicare Other | Attending: Physical Medicine & Rehabilitation | Admitting: Registered Nurse

## 2017-05-07 VITALS — BP 106/62 | HR 100

## 2017-05-07 DIAGNOSIS — G894 Chronic pain syndrome: Secondary | ICD-10-CM

## 2017-05-07 DIAGNOSIS — M47816 Spondylosis without myelopathy or radiculopathy, lumbar region: Secondary | ICD-10-CM | POA: Diagnosis not present

## 2017-05-07 DIAGNOSIS — Z90710 Acquired absence of both cervix and uterus: Secondary | ICD-10-CM | POA: Insufficient documentation

## 2017-05-07 DIAGNOSIS — Z79899 Other long term (current) drug therapy: Secondary | ICD-10-CM | POA: Diagnosis not present

## 2017-05-07 DIAGNOSIS — M2550 Pain in unspecified joint: Secondary | ICD-10-CM

## 2017-05-07 DIAGNOSIS — E1142 Type 2 diabetes mellitus with diabetic polyneuropathy: Secondary | ICD-10-CM | POA: Diagnosis not present

## 2017-05-07 DIAGNOSIS — M48062 Spinal stenosis, lumbar region with neurogenic claudication: Secondary | ICD-10-CM | POA: Insufficient documentation

## 2017-05-07 DIAGNOSIS — F329 Major depressive disorder, single episode, unspecified: Secondary | ICD-10-CM | POA: Insufficient documentation

## 2017-05-07 DIAGNOSIS — M7062 Trochanteric bursitis, left hip: Secondary | ICD-10-CM | POA: Diagnosis not present

## 2017-05-07 DIAGNOSIS — M069 Rheumatoid arthritis, unspecified: Secondary | ICD-10-CM | POA: Diagnosis not present

## 2017-05-07 DIAGNOSIS — E1122 Type 2 diabetes mellitus with diabetic chronic kidney disease: Secondary | ICD-10-CM | POA: Diagnosis not present

## 2017-05-07 DIAGNOSIS — M1711 Unilateral primary osteoarthritis, right knee: Secondary | ICD-10-CM | POA: Diagnosis not present

## 2017-05-07 DIAGNOSIS — Z5181 Encounter for therapeutic drug level monitoring: Secondary | ICD-10-CM

## 2017-05-07 DIAGNOSIS — Z9889 Other specified postprocedural states: Secondary | ICD-10-CM | POA: Insufficient documentation

## 2017-05-07 DIAGNOSIS — M62838 Other muscle spasm: Secondary | ICD-10-CM

## 2017-05-07 DIAGNOSIS — M1712 Unilateral primary osteoarthritis, left knee: Secondary | ICD-10-CM

## 2017-05-07 MED ORDER — MORPHINE SULFATE 15 MG PO TABS: 15.0000 mg | ORAL_TABLET | Freq: Three times a day (TID) | ORAL | 0 refills | Status: DC | PRN

## 2017-05-07 MED ORDER — MORPHINE SULFATE ER 15 MG PO TBCR: 15.0000 mg | EXTENDED_RELEASE_TABLET | Freq: Two times a day (BID) | ORAL | 0 refills | Status: DC

## 2017-05-07 NOTE — Patient Outreach (Signed)
[facility]) Care Management  05/07/2017  [name] 07-29-1940 [id number]  Assessment- CSW completed outreach call to patient to follow up on social work needs. Patient answered and provided HIPPA verifications. She reports that she received a letter in the mail that informed her that she had been approved for the Aid and Attendance program through the [place] as of 04/15/17. Patient reports that she is very excited to get this news. She denies having any other social work needs and is very Patent attorney social work assistance during this process. Patient is agreeable to social work discharge at this time and understands that she will still be active with [place].  [facility] CM Care Plan Problem One     Most Recent Value  Care Plan Problem One  Lack of care within the home and need for community resource education  Role Documenting the Problem One  Clinical Social Worker  Care Plan for Problem One  Active  THN CM Short Term Goal #1   Patient will gain personal care resource education within 30 days  THN CM Short Term Goal #1 Start Date  03/02/17  [facility] CM Short Term Goal #1 Met Date  04/08/17  Interventions for Short Term Goal #1  Goal completed. Resource review completed again  [facility] CM Short Term Goal #2   Patient will schedule appointment with [place] specialist within 30 days  THN CM Short Term Goal #2 Start Date  04/08/17  [facility] CM Short Term Goal #2 Met Date  05/07/17  Interventions for Short Term Goal #2  Goal met.     Plan-CSW will update THN RNCM and PCP of social work discharge.  [name], BSW, MSW, [email or web address] Phone: [phone] Fax: [phone]

## 2017-05-07 NOTE — Progress Notes (Signed)
Subjective:    Patient ID: [name], female    DOB: 25-May-1940, 75 y.o.   MRN: [id number]  HPI: [name] is a 76 year old female who returns for follow up appointment for chronic painand medication refill. She states her pain is located in her lower back radiating into her  left hip, bilateral knee pain and tingling and burning into her  bilateral feet. She ratesher pain 5. Her current exercise regime is performing chair exercises and walking short distances with walker.  [name] Morphine equivalent is  75.00 MME.     [name] last UDS was on 09/04/2016, it was consistent. Oral Swab performed Today.    Pain Inventory Average Pain 7 Pain Right Now 5 My pain is intermittent, sharp and dull  In the last 24 hours, has pain interfered with the following? General activity 8 Relation with others 7 Enjoyment of life 8 What TIME of day is your pain at its worst? daytime Sleep (in general) Fair  Pain is worse with: walking, bending and standing Pain improves with: medication Relief from Meds: no ans  Mobility use a cane use a walker how many minutes can you walk? 8 ability to climb steps?  no do you drive?  no  Function retired  Neuro/Psych bladder control problems weakness tingling  Prior Studies Any changes since last visit?  no  Physicians involved in your care Any changes since last visit?  no   Family History  Problem Relation Age of Onset  . Diabetes Mother   . Heart attack Mother   . Hypertension Father   . Lung cancer Father   . Diabetes Sister   . Diabetes Brother   . Hypertension Brother   . Heart disease Brother   . Heart attack Brother   . Kidney cancer Brother   . Uterine cancer Daughter   . Breast cancer Sister   . Rheum arthritis Maternal Uncle   . Gout Brother   . Kidney failure Brother        x 5   Social History   Socioeconomic History  . Marital status: Married    Spouse name: None  . Number of  children: 6  . Years of education: college  . Highest education level: None  Social Needs  . Financial resource strain: None  . Food insecurity - worry: None  . Food insecurity - inability: None  . Transportation needs - medical: None  . Transportation needs - non-medical: None  Occupational History  . Occupation: retired Marine scientist  Tobacco Use  . Smoking status: Never Smoker  . Smokeless tobacco: Never Used  Substance and Sexual Activity  . Alcohol use: No  . Drug use: No  . Sexual activity: Not Currently  Other Topics Concern  . None  Social History Narrative   Patient consumes 2-3 cups coffee per day.   Past Surgical History:  Procedure Laterality Date  . ABDOMINAL HYSTERECTOMY    . BRAIN SURGERY     Gamma knife 10/13. Needs repeat spring  '14  . CARDIAC CATHETERIZATION N/A 10/31/2014   Procedure: Right/Left Heart Cath and Coronary Angiography;  Surgeon: [name], MD;  Location: [facility];  Service: Cardiovascular;  Laterality: N/A;  . ESOPHAGOGASTRODUODENOSCOPY (EGD) WITH PROPOFOL N/A 09/14/2014   Procedure: ESOPHAGOGASTRODUODENOSCOPY (EGD) WITH PROPOFOL;  Surgeon: [name], MD;  Location: [facility];  Service: Endoscopy;  Laterality: N/A;  . INCISION AND DRAINAGE HIP Left 01/16/2017   Procedure: IRRIGATION  AND DEBRIDEMENT LEFT HIP;  Surgeon: [name], MD;  Location: [facility];  Service: Orthopedics;  Laterality: Left;  . INTRAMEDULLARY (IM) NAIL INTERTROCHANTERIC Left 11/29/2016   Procedure: INTRAMEDULLARY (IM) NAIL INTERTROCHANTRIC;  Surgeon: [name], MD;  Location: [facility];  Service: Orthopedics;  Laterality: Left;  . OVARY SURGERY    . SHOULDER SURGERY Left   . TONSILLECTOMY  age 42  . VIDEO BRONCHOSCOPY Bilateral 05/31/2013   Procedure: VIDEO BRONCHOSCOPY WITHOUT FLUORO;  Surgeon: Brand Males, MD;  Location: [facility];  Service: Cardiopulmonary;  Laterality: Bilateral;  . video bronscoscopy  2000   lung   Past  Medical History:  Diagnosis Date  . Acute on chronic diastolic (congestive) heart failure ([place])   . Anemia    iron deficiency anemia - secondary to blood loss ( chronic)   . Arthritis    endstage changes bilateral knees/bilateral ankles.   . Asthma   . Carotid artery occlusion   . Chronic fatigue   . Chronic kidney disease   . Closed left hip fracture ([place])   . Clotting disorder ([place])    pt denies this  . Contusion of left knee    due to fall 1/14.  [name] COPD (chronic obstructive pulmonary disease) (HCC)    pulmonary fibrosis  . Depression, reactive   . Diabetes mellitus    type II   . Diastolic dysfunction   . Difficulty in walking   . Family history of heart disease   . Generalized muscle weakness   . Gout   . High cholesterol   . History of falling   . Hypertension   . Hypothyroidism   . Interstitial lung disease ([place])   . 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    . Morbid obesity ([place])   . Morbid obesity ([place])   . Neuromuscular disorder ([place])    diabetic neuropathy   . Normal coronary arteries    cardiac catheterization performed  10/31/14  . RA (rheumatoid arthritis) ([place])    has been off methotreaxte since 10/13.  [name] Rheumatoid arthritis ([place])   . Shortness of breath   . Spinal stenosis of lumbar region   . Thyroid disease   . Unspecified lack of coordination   . URI (upper respiratory infection)    BP 106/62   Pulse 100   SpO2 91%   Opioid Risk Score:  0 Fall Risk Score:  `1  Depression screen PHQ 2/9  Depression screen [facility] 2/9 05/07/2017 04/06/2017 03/10/2017 03/02/2017 02/26/2017 11/28/2016 09/04/2016  Decreased Interest 0 1 0 0 0 0 0  Down, Depressed, Hopeless 0 1 0 0 0 0 0  PHQ - 2 Score 0 2 0 0 0 0 0  Altered sleeping - - - - - - -  Tired, decreased energy - - - - - - -  Change in appetite - - - - - - -  Feeling bad or failure about yourself  - - - - - - -    Trouble concentrating - - - - - - -  Moving slowly or fidgety/restless - - - - - - -  Suicidal thoughts - - - - - - -  PHQ-9 Score - - - - - - -  Difficult doing work/chores - - - - - - -  Some recent data might be hidden    Review of Systems  HENT: Negative.   Eyes:  Negative.   Respiratory: Negative.   Cardiovascular: Negative.   Gastrointestinal: Negative.   Endocrine: Negative.   Genitourinary: Positive for urgency.  Musculoskeletal: Positive for gait problem.  Skin: Negative.   Allergic/Immunologic: Negative.   Neurological: Positive for weakness and numbness.  Hematological: Negative.   Psychiatric/Behavioral: Negative.   All other systems reviewed and are negative.      Objective:   Physical Exam  Constitutional: She is oriented to person, place, and time. She appears well-developed and well-nourished.  HENT:  Head: Normocephalic and atraumatic.  Neck: Normal range of motion. Neck supple.  Cardiovascular: Normal rate and regular rhythm.  Pulmonary/Chest: Effort normal and breath sounds normal.  Musculoskeletal:  Normal Muscle Bulk and Muscle Testing Reveals: Upper Extremities: Full ROM and Muscle Strength 5/5 Bilateral AC Joint Tenderness Thoracic Hypersensitivity Lumbar Paraspinal Tenderness: L-3-L-5 Bilateral  Greater Trochanteric Tenderness Lower Extremities: Full ROM and Muscle Strength 5/5 Arises from Table slowly Using walker for support Narrow Based Gait  Neurological: She is alert and oriented to person, place, and time.  Skin: Skin is warm and dry.  Psychiatric: She has a normal mood and affect.  Nursing note and vitals reviewed.         Assessment & Plan:  1. Lumbar spinal stenosis with neurogenic claudication. Associated facet arthropathy: Continue Gabapentinand HEP as tolerated. 05/07/2017 Continue:  MS Contin 15 mg one tablet every 12 hours #60and continueMSIR 15 mg 1 one tablet every 8hours as needed #30.  We will continue the  opioid monitoring program, this consists of regular clinic visits, examinations, urine drug screen, pill counts as well as use of [place] Controlled Substance reporting System. 2. Depression: Continue Cymbalta. 05/07/2017 3. Diabetes mellitus type 2 with polyneuropathy: Continue Gabapentin. 05/07/2017 4. Rheumatoid arthritis and osteoarthritis. Contnue Voltaren Gel. Rheumatology Following. 05/07/2017 5. Interstitial lung disease: Pulmonology Following. 05/07/2017 6. Bilateral Osteoarthritis Knee's: Continue Voltaren Gel.05/07/2017 7. Muscle Spasm: Continue : Flexeril. 05/07/2017 8. Left Greater Trochanteric tenderness: Continue with Ice/Heat Therapy. 05/07/2017 9. Polyarthralgia: Rheumatology Following: Continue to monitor. 05/07/2017 10. Morbid Obesitity: Continue Healthy Diet Regime and HEP. 05/07/2017.  20 minutes of face to face patient care time was spent during this visit. All questions were encouraged and answered.   F/U in 1 month

## 2017-05-11 LAB — DRUG TOX MONITOR 1 W/CONF, ORAL FLD

## 2017-05-11 LAB — DRUG TOX ALC METAB W/CON, ORAL FLD: Alcohol Metabolite: NEGATIVE ng/mL (ref <25)

## 2017-05-14 ENCOUNTER — Ambulatory Visit (INDEPENDENT_AMBULATORY_CARE_PROVIDER_SITE_OTHER): Payer: Medicare Other | Admitting: Orthopaedic Surgery

## 2017-05-15 ENCOUNTER — Ambulatory Visit (INDEPENDENT_AMBULATORY_CARE_PROVIDER_SITE_OTHER): Payer: Medicare Other | Admitting: Podiatry

## 2017-05-15 ENCOUNTER — Encounter: Payer: Self-pay | Admitting: Podiatry

## 2017-05-15 DIAGNOSIS — B351 Tinea unguium: Secondary | ICD-10-CM | POA: Diagnosis not present

## 2017-05-15 DIAGNOSIS — E1142 Type 2 diabetes mellitus with diabetic polyneuropathy: Secondary | ICD-10-CM

## 2017-05-15 DIAGNOSIS — M79676 Pain in unspecified toe(s): Secondary | ICD-10-CM

## 2017-05-15 NOTE — Progress Notes (Signed)
Complaint:  Visit Type: Patient returns to my office for continued preventative foot care services. Complaint: Patient states" my nails have grown long and thick and become painful to walk and wear shoes" Patient has been diagnosed with DM with no foot complications. The patient presents for preventative foot care services. No changes to ROS.  Patient says her callus are resolved. Patient says she still has not received her diabetic shoes.  Podiatric Exam: Vascular: dorsalis pedis and posterior tibial pulses are palpable bilateral. Capillary return is immediate. Temperature gradient is WNL. Skin turgor WNL  Sensorium: Normal Semmes Weinstein monofilament test. Normal tactile sensation bilaterally. Nail Exam: Pt has thick disfigured discolored nails with subungual debris noted bilateral entire nail hallux through fifth toenails Ulcer Exam: There is no evidence of ulcer or pre-ulcerative changes or infection. Orthopedic Exam: Muscle tone and strength are WNL. No limitations in general ROM. No crepitus or effusions noted. Foot type and digits show no abnormalities. Bony prominences are unremarkable. Skin: No Porokeratosis. No infection or ulcers  Diagnosis:  Onychomycosis, , Pain in right toe, pain in left toes  Treatment & Plan Procedures and Treatment: Consent by patient was obtained for treatment procedures.   Debridement of mycotic and hypertrophic toenails, 1 through 5 bilateral and clearing of subungual debris. No ulceration, no infection noted.  Return Visit-Office Procedure: Patient instructed to return to the office for a follow up visit 3 months for continued evaluation and treatment.    Gardiner Barefoot DPM

## 2017-05-18 ENCOUNTER — Encounter: Payer: Self-pay | Admitting: *Deleted

## 2017-05-18 ENCOUNTER — Other Ambulatory Visit: Payer: Self-pay | Admitting: *Deleted

## 2017-05-18 NOTE — Patient Outreach (Signed)
[facility]) Care Management [place] Telephone Outreach, Case Closure  05/18/2017  [name] [birth year] [id number]  Successful telephone outreach toDeloris F Mcdaniel,76 y.o.femalereferred to [place] after recent hospitalization August 31-January 21, 2017 for (L) hip fracture after fall; Patient was discharged to SNF for rehabilitation and was discharged from [facility] visit on February 13, 2017 with home health [facility]) services through [place]. Patient has history including, but not limited to, essential HTN, HLD, DM Type II, rheumatoid arthritis, depression, CHF, COPD with chronic respiratory failure, morbid obesity, and GERD. HIPAA/ identity verified with patientduring phone call today.  Today, patient reports she continues "doing real good," and states that she "had a great Christmas."  Patient sounds to be in no apparent/ obvious distress throughout entirety of today's phone call.  Patient reports that she continues working with the [place] re: approval for in-home care assistance; reports this has not yet started but states that she is "working closely with the [place]" and expects these services to be in place "soon."  [facility] CSW previously closed case earlier this month.  Patient further reports:  Medications: -- Has all medicationsandistakingas prescribed;denies questions about current medications;confirms no recent changes to overall medication regimine.  Provider appointments: -- All upcoming/previousprovider appointments were reviewed with patient today;patient verbalizes plans to attend allupcomingprovider appointments, states family continues providing transportation to appointments  Safety/ Mobility/ Falls: -- denies new/ recent falls  -- assistive devices:continuesusingwheelchair"maily as needed,"and "mainly using walker." -- general fall risks/ prevention educationreiterated/ reviewedwith patient today  Self-health  management of CHF, COPD/ ILD/ DM: --reports "normal" shortness of breat with activity; denies concerns around breathing.  Reports in "green zone" today   --continuesprimarilyusinghome O2 at 2 L/min via [place] "onlyat night;"againreportsthat usingduring day times hours only if she becomes "winded" with activity  --continuesmonitoring/ recordingdaily weights;reports "no recent weight fluctuations."  Reiterated weight gain guidelinesin setting of CHF,heart failure/ COPD zones today, and patient is able to accurately verbalize action plan around periods of weight gain, and is able to verbalize signs/ symptoms yellow zone, along with action plan to take.  -- continuesmonitoringblood glucose levels once daily, in morning, fasting; reports no concerns aorund blood sugar readings, stating, "they are staying right in the same range they have been since I got home from the hospital."  -- reportsthat she has continued monitoring BP's regularly, and states "BP's are fine," and deniesepisodes of dizziness or otherconcernswith dailyBPmeasurements, as remotely reported after SNF discharge  Patient denies further issues, concerns, or problems today, and we agreed that she has successfully met all of her previously established [facility] CM goals; we again discussed the possibility of [facility] CM case closure, and patient agrees today that she is ready for case closure;   I confirmed that patient hasmy direct phone number, the main [facility] CM office phone number, and the [facility] CM 24-hour nurse advice phone number should issues arise in the future, and patient agrees to call if needs arise after today.  Plan:  Will close [place] program and make patient's PCP and [facility] CMA aware of same  [facility] CM Care Plan Problem Two     Most Recent Value  Care Plan Problem Two  Need for reinforcement of self-health management strategies for chronic disease states of CHF, COPD, and DM  Role Documenting the Problem  Two  Care Management Coordinator  Care Plan for Problem Two  Not Active  [facility] CM Short Term Goal #1   Over the next 30 days, patient  will continue monitoring and recording daily weights, as evidenced by patient reporting of same during [facility] RN CCM outreach, and visual review of patient's recorded weights during next [place] home visit  [facility] CM Short Term Goal #1 Start Date  04/02/17  [facility] CM Short Term Goal #1 Met Date   05/18/17-- confirmed goal met; [facility] CM case closure  [facility] CM Short Term Goal #2   Over the next 30 days, patient will attend all scheduled provider appointments and will notify providers for any new concerns, weight gain, issues, or problems, as evidenced by patient reporting of same during [place] outreach/ review of EMR  [facility] CM Short Term Goal #2 Start Date  04/02/17  [facility] CM Short Term Goal #2 Met Date  05/18/17-- confirmed goal met; [facility] CM case closure     It has been a pleasure caring for [name],  Joan Rack, RN, BSN, [place] Coordinator [facility] Care Management  [phone]

## 2017-05-25 ENCOUNTER — Telehealth: Payer: Self-pay | Admitting: *Deleted

## 2017-05-25 NOTE — Telephone Encounter (Signed)
Oral swab drug screen was inconsistent. No meds or metabolites in oral swab report. Meds were reported as taken same day as test.  This was a repeat test because the same was true for last month. I have notified Zella Ball and she will discuss with Dr Naaman Plummer.

## 2017-05-27 ENCOUNTER — Ambulatory Visit (INDEPENDENT_AMBULATORY_CARE_PROVIDER_SITE_OTHER): Payer: Medicare Other | Admitting: Orthopaedic Surgery

## 2017-05-27 ENCOUNTER — Telehealth: Payer: Self-pay | Admitting: Registered Nurse

## 2017-05-27 NOTE — Telephone Encounter (Signed)
Spoke with Dr. Naaman Plummer regarding Ms. Hiltunen Oral Swab Results: She had a Oral Swab Performed on 05/07/2017 and 11/28/2016 it was inconsistent.  UDS was performed on 09/04/2016 and 04/01/2016 it was consistent.  We will be performing UDS on Ms. Vokes, Dr. Naaman Plummer in agreement.

## 2017-06-03 ENCOUNTER — Encounter: Payer: Self-pay | Admitting: Registered Nurse

## 2017-06-03 ENCOUNTER — Other Ambulatory Visit: Payer: Self-pay

## 2017-06-03 ENCOUNTER — Encounter: Payer: Medicare Other | Attending: Physical Medicine & Rehabilitation | Admitting: Registered Nurse

## 2017-06-03 VITALS — BP 119/77 | HR 94

## 2017-06-03 DIAGNOSIS — G894 Chronic pain syndrome: Secondary | ICD-10-CM

## 2017-06-03 DIAGNOSIS — M1712 Unilateral primary osteoarthritis, left knee: Secondary | ICD-10-CM | POA: Diagnosis not present

## 2017-06-03 DIAGNOSIS — R5382 Chronic fatigue, unspecified: Secondary | ICD-10-CM

## 2017-06-03 DIAGNOSIS — M069 Rheumatoid arthritis, unspecified: Secondary | ICD-10-CM | POA: Insufficient documentation

## 2017-06-03 DIAGNOSIS — M5416 Radiculopathy, lumbar region: Secondary | ICD-10-CM

## 2017-06-03 DIAGNOSIS — Z9889 Other specified postprocedural states: Secondary | ICD-10-CM | POA: Diagnosis not present

## 2017-06-03 DIAGNOSIS — M47816 Spondylosis without myelopathy or radiculopathy, lumbar region: Secondary | ICD-10-CM

## 2017-06-03 DIAGNOSIS — E1142 Type 2 diabetes mellitus with diabetic polyneuropathy: Secondary | ICD-10-CM | POA: Insufficient documentation

## 2017-06-03 DIAGNOSIS — E1122 Type 2 diabetes mellitus with diabetic chronic kidney disease: Secondary | ICD-10-CM | POA: Insufficient documentation

## 2017-06-03 DIAGNOSIS — M25552 Pain in left hip: Secondary | ICD-10-CM | POA: Diagnosis not present

## 2017-06-03 DIAGNOSIS — E1140 Type 2 diabetes mellitus with diabetic neuropathy, unspecified: Secondary | ICD-10-CM

## 2017-06-03 DIAGNOSIS — M2550 Pain in unspecified joint: Secondary | ICD-10-CM

## 2017-06-03 DIAGNOSIS — Z5181 Encounter for therapeutic drug level monitoring: Secondary | ICD-10-CM | POA: Diagnosis not present

## 2017-06-03 DIAGNOSIS — Z90710 Acquired absence of both cervix and uterus: Secondary | ICD-10-CM | POA: Insufficient documentation

## 2017-06-03 DIAGNOSIS — Z79899 Other long term (current) drug therapy: Secondary | ICD-10-CM | POA: Diagnosis not present

## 2017-06-03 DIAGNOSIS — F329 Major depressive disorder, single episode, unspecified: Secondary | ICD-10-CM | POA: Diagnosis not present

## 2017-06-03 DIAGNOSIS — M62838 Other muscle spasm: Secondary | ICD-10-CM

## 2017-06-03 DIAGNOSIS — M1711 Unilateral primary osteoarthritis, right knee: Secondary | ICD-10-CM

## 2017-06-03 DIAGNOSIS — M48062 Spinal stenosis, lumbar region with neurogenic claudication: Secondary | ICD-10-CM | POA: Diagnosis not present

## 2017-06-03 MED ORDER — MORPHINE SULFATE ER 15 MG PO TBCR: 15.0000 mg | EXTENDED_RELEASE_TABLET | Freq: Two times a day (BID) | ORAL | 0 refills | Status: DC

## 2017-06-03 MED ORDER — GABAPENTIN 600 MG PO TABS: ORAL_TABLET | ORAL | 3 refills | Status: DC

## 2017-06-03 MED ORDER — MORPHINE SULFATE 15 MG PO TABS: 15.0000 mg | ORAL_TABLET | Freq: Two times a day (BID) | ORAL | 0 refills | Status: DC | PRN

## 2017-06-03 MED ORDER — DULOXETINE HCL 60 MG PO CPEP: 60.0000 mg | ORAL_CAPSULE | Freq: Every day | ORAL | 3 refills | Status: DC

## 2017-06-03 NOTE — Patient Instructions (Signed)
Call office in a week to evaluate Medication Regimen: [phone]

## 2017-06-03 NOTE — Progress Notes (Signed)
Subjective:    Patient ID: [name], female    DOB: 1940-07-09, 77 y.o.   MRN: [id number]  HPI: [name] is a 77 year old female who returns for follow up appointment for chronic painand medication refill. She states her pain is located in her lower back, left hip and bilateral knees. Also reports for the last three weeks her left hip pain has increased in intensity, she has been using her MSIR tablets on an average 2-3 times a day. She has noticed her hip pain  has increased since hip fracture, site healed, no drainage. She has a scheduled F/U appointment with Dr. Rush Farmer on 06/09/2017  She rates her pain 7. Her current exercise regime is performing chair exercises and walking short distances with walker.  [name] Morphine equivalent is  40.50 MME.    [name] had a UDS  today.   Pain Inventory Average Pain 7 Pain Right Now 7 My pain is sharp, stabbing and aching  In the last 24 hours, has pain interfered with the following? General activity 7 Relation with others 7 Enjoyment of life 8 What TIME of day is your pain at its worst? morning Sleep (in general) Fair  Pain is worse with: walking, bending and standing Pain improves with: heat/ice, pacing activities and medication Relief from Meds: no ans  Mobility use a cane use a walker how many minutes can you walk? 8 ability to climb steps?  no do you drive?  no use a wheelchair Do you have any goals in this area?  no  Function retired I need assistance with the following:  meal prep, household duties and shopping  Neuro/Psych bladder control problems numbness tingling trouble walking spasms  Prior Studies Any changes since last visit?  no  Physicians involved in your care Any changes since last visit?  no   Family History  Problem Relation Age of Onset  . Diabetes Mother   . Heart attack Mother   . Hypertension Father   . Lung cancer Father   . Diabetes Sister   . Diabetes  Brother   . Hypertension Brother   . Heart disease Brother   . Heart attack Brother   . Kidney cancer Brother   . Uterine cancer Daughter   . Breast cancer Sister   . Rheum arthritis Maternal Uncle   . Gout Brother   . Kidney failure Brother        x 5   Social History   Socioeconomic History  . Marital status: Married    Spouse name: None  . Number of children: 6  . Years of education: college  . Highest education level: None  Social Needs  . Financial resource strain: None  . Food insecurity - worry: None  . Food insecurity - inability: None  . Transportation needs - medical: None  . Transportation needs - non-medical: None  Occupational History  . Occupation: retired Marine scientist  Tobacco Use  . Smoking status: Never Smoker  . Smokeless tobacco: Never Used  Substance and Sexual Activity  . Alcohol use: No  . Drug use: No  . Sexual activity: Not Currently  Other Topics Concern  . None  Social History Narrative   Patient consumes 2-3 cups coffee per day.   Past Surgical History:  Procedure Laterality Date  . ABDOMINAL HYSTERECTOMY    . BRAIN SURGERY     Gamma knife 10/13. Needs repeat spring  '14  . CARDIAC CATHETERIZATION N/A 10/31/2014  Procedure: Right/Left Heart Cath and Coronary Angiography;  Surgeon: [name], MD;  Location: [place] [facility];  Service: Cardiovascular;  Laterality: N/A;  . ESOPHAGOGASTRODUODENOSCOPY (EGD) WITH PROPOFOL N/A 09/14/2014   Procedure: ESOPHAGOGASTRODUODENOSCOPY (EGD) WITH PROPOFOL;  Surgeon: [name], MD;  [facility];  Service: Endoscopy;  Laterality: N/A;  . INCISION AND DRAINAGE HIP Left 01/16/2017   Procedure: IRRIGATION AND DEBRIDEMENT LEFT HIP;  Surgeon: [name], MD;  [facility]: [facility];  Service: Orthopedics;  Laterality: Left;  . INTRAMEDULLARY (IM) NAIL INTERTROCHANTERIC Left 11/29/2016   Procedure: INTRAMEDULLARY (IM) NAIL INTERTROCHANTRIC;  Surgeon: [name], MD;   Location: [place];  Service: Orthopedics;  Laterality: Left;  . OVARY SURGERY    . SHOULDER SURGERY Left   . TONSILLECTOMY  age 65  . VIDEO BRONCHOSCOPY Bilateral 05/31/2013   Procedure: VIDEO BRONCHOSCOPY WITHOUT FLUORO;  Surgeon: Brand Males, MD;  Location: [place];  Service: Cardiopulmonary;  Laterality: Bilateral;  . video bronscoscopy  2000   lung   Past Medical History:  Diagnosis Date  . Acute on chronic diastolic (congestive) heart failure ([place])   . Anemia    iron deficiency anemia - secondary to blood loss ( chronic)   . Arthritis    endstage changes bilateral knees/bilateral ankles.   . Asthma   . Carotid artery occlusion   . Chronic fatigue   . Chronic kidney disease   . Closed left hip fracture ([place])   . Clotting disorder ([place])    pt denies this  . Contusion of left knee    due to fall 1/14.  [name] COPD (chronic obstructive pulmonary disease) (HCC)    pulmonary fibrosis  . Depression, reactive   . Diabetes mellitus    type II   . Diastolic dysfunction   . Difficulty in walking   . Family history of heart disease   . Generalized muscle weakness   . Gout   . High cholesterol   . History of falling   . Hypertension   . Hypothyroidism   . Interstitial lung disease ([place])   . 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    . Morbid obesity ([place])   . Morbid obesity ([place])   . Neuromuscular disorder ([place])    diabetic neuropathy   . Normal coronary arteries    cardiac catheterization performed  10/31/14  . RA (rheumatoid arthritis) ([place])    has been off methotreaxte since 10/13.  [name] Rheumatoid arthritis ([place])   . Shortness of breath   . Spinal stenosis of lumbar region   . Thyroid disease   . Unspecified lack of coordination   . URI (upper respiratory infection)    There were no vitals taken for this visit.  Opioid Risk Score:  0 Fall Risk Score:  `1  Depression screen  PHQ 2/9  Depression screen [facility] 2/9 06/03/2017 05/07/2017 04/06/2017 03/10/2017 03/02/2017 02/26/2017 11/28/2016  Decreased Interest 0 0 1 0 0 0 0  Down, Depressed, Hopeless 0 0 1 0 0 0 0  PHQ - 2 Score 0 0 2 0 0 0 0  Altered sleeping - - - - - - -  Tired, decreased energy - - - - - - -  Change in appetite - - - - - - -  Feeling bad or failure about yourself  - - - - - - -  Trouble concentrating - - - - - - -  Moving slowly or fidgety/restless - - - - - - -  Suicidal thoughts - - - - - - -  PHQ-9 Score - - - - - - -  Difficult doing work/chores - - - - - - -  Some recent data might be hidden    Review of Systems  HENT: Negative.   Eyes: Negative.   Respiratory: Negative.   Cardiovascular: Negative.   Gastrointestinal: Negative.   Endocrine: Negative.   Genitourinary: Positive for urgency.  Musculoskeletal: Positive for gait problem.  Skin: Negative.   Allergic/Immunologic: Negative.   Neurological: Positive for weakness and numbness.  Hematological: Negative.   Psychiatric/Behavioral: Negative.   All other systems reviewed and are negative.      Objective:   Physical Exam  Constitutional: She is oriented to person, place, and time. She appears well-developed and well-nourished.  HENT:  Head: Normocephalic and atraumatic.  Neck: Normal range of motion. Neck supple.  Cardiovascular: Normal rate and regular rhythm.  Pulmonary/Chest: Effort normal and breath sounds normal.  Musculoskeletal:  Normal Muscle Bulk and Muscle Testing Reveals: Upper Extremities: Full ROM and Muscle Strength 4/5 Bilateral AC Joint Tenderness Thoracic and Lumbar Hypersensitivity Bilateral Greater Trochanter Tenderness: L>R Lower Extremities: Full ROM and Muscle Strength 5/5 Arises from Table slowly Using walker for support Narrow Based Gait  Neurological: She is alert and oriented to person, place, and time.  Skin: Skin is warm and dry.  Psychiatric: She has a normal mood and affect.    Nursing note and vitals reviewed.         Assessment & Plan:  1. Lumbar spinal stenosis with neurogenic claudication. Associated facet arthropathy: Continue Gabapentinand HEP as tolerated. 06/03/2017 Continue: MS Contin 15 mg one tablet every 12 hours #60and continueMSIR 15 one tablet every 12 hours as needed #60. We will continue the opioid monitoring program, this consists of regular clinic visits, examinations, urine drug screen, pill counts as well as use of [place] Controlled Substance reporting System. 2. Depression: Continue Cymbalta. 06/03/2017 3. Diabetes mellitus type 2 with polyneuropathy: Continue Gabapentin. 06/03/2017 4. Rheumatoid arthritis and osteoarthritis. Contnue Voltaren Gel. Rheumatology Following. 06/03/2017 5. Interstitial lung disease: Pulmonology Following.06/03/2017. 6. Bilateral Osteoarthritis Knee's: Continue Voltaren Gel.06/03/2017 7. Muscle Spasm: Continue : Flexeril. 06/03/2017 8. Acute Left Hip Pain: F/U with Dr. Rush Farmer Left Greater Trochanteric Tenderness: Continue with Ice/Heat Therapy. 06/03/2017. 9. Polyarthralgia: Rheumatology Following: Continue to monitor.06/03/2017 10. Morbid Obesitity: Continue Healthy Diet Regime and HEP. 06/03/2017  20 minutes of face to face patient care time was spent during this visit. All questions were encouraged and answered.   F/U in 1 month

## 2017-06-09 ENCOUNTER — Ambulatory Visit (INDEPENDENT_AMBULATORY_CARE_PROVIDER_SITE_OTHER): Payer: Medicare Other

## 2017-06-09 ENCOUNTER — Ambulatory Visit (INDEPENDENT_AMBULATORY_CARE_PROVIDER_SITE_OTHER): Payer: Medicare Other | Admitting: Physician Assistant

## 2017-06-09 ENCOUNTER — Encounter (INDEPENDENT_AMBULATORY_CARE_PROVIDER_SITE_OTHER): Payer: Self-pay | Admitting: Orthopaedic Surgery

## 2017-06-09 DIAGNOSIS — S72145G Nondisplaced intertrochanteric fracture of left femur, subsequent encounter for closed fracture with delayed healing: Secondary | ICD-10-CM

## 2017-06-09 NOTE — Progress Notes (Signed)
Office Visit Note   Patient: [name]           Date of Birth: November 21, 1940           MRN: [id number] Visit Date: 06/09/2017              Requested by: Glendale Chard, [address] [place], [place] [address] PCP: Glendale Chard, MD   Assessment & Plan: Visit Diagnoses:  1. Closed nondisplaced intertrochanteric fracture of left femur with delayed healing     Plan: She will continue to work on overall strengthening of the left hip.  Follow-up in 4 months sooner if there is any questions or concerns.  Questions were encouraged and answered at length.  At 16-monthfollow-up we will get AP and lateral views of the left hip.  Follow-Up Instructions: Return in about 4 months (around 10/07/2017) for Radiographs.   Orders:  Orders Placed This Encounter  Procedures  . XR HIP UNILAT W OR W/O PELVIS 1V LEFT   No orders of the defined types were placed in this encounter.     Procedures: No procedures performed   Clinical Data: No additional findings.   Subjective: Chief Complaint  Patient presents with  . Left Hip - Follow-up    HPI Mrs. GHettichreturns today 5 weeks status post irrigation and debridement left hip.  She is now 6 months status post IM nailing of the left hip fracture.  She states overall she is doing well she is transitioning from a walker to a cane.  States she feels she is slowly getting better.  She has had no issues with her left hip wound.  Still has some pain off and on with sitting lying on the left hip. Review of Systems No fevers chills shortness of breath chest pain drainage from left hip wound  Objective: Vital Signs: There were no vitals taken for this visit.  Physical Exam  Constitutional: She is oriented to person, place, and time. She appears well-developed and well-nourished. No distress.  Neurological: She is alert and oriented to person, place, and time.  Skin: She is not diaphoretic.  Psychiatric: She has a normal mood  and affect.    Ortho Exam Left hip good range of motion with internal and external rotation.  Left hip wound is healing well no signs of infection.  There is some evidence of fatty necrosis. Specialty Comments:  No specialty comments available.  Imaging: Xr Hip Unilat W Or W/o Pelvis 1v Left  Result Date: 06/09/2017 AP and lateral views left hip: No hardware failure.  Abundant callus formation at the fracture site.  Hips well located no other fractures identified.    PMFS History: Patient Active Problem List   Diagnosis Date Noted  . Pain in left hip 02/18/2017  . Wound infection after surgery, initial encounter 01/16/2017  . Closed nondisplaced intertrochanteric fracture of left femur with delayed healing 01/14/2017  . Postoperative wound infection 01/14/2017  . Gout 11/29/2016  . Depression 11/29/2016  . Closed left hip fracture ([facility] 11/29/2016  . GERD (gastroesophageal reflux disease) 11/29/2016  . Closed intertrochanteric fracture of hip, left, initial encounter ([place] 11/29/2016  . Physical deconditioning 07/31/2015  . Pulmonary fibrosis, postinflammatory ([facility] 07/31/2015  . Tendinitis of left rotator cuff 06/13/2015  . Acute on chronic diastolic heart failure ([facility] 04/21/2015  . Acute pulmonary edema (HCC)   . AKI (acute kidney injury) ([place]   . Peripheral edema   . Acute on chronic respiratory failure  with hypoxia ([place]) 04/17/2015  . CAP (community acquired pneumonia) 04/17/2015  . Acute kidney injury superimposed on chronic kidney disease ([place]) 04/17/2015  . Hypotension 04/17/2015  . Chronic respiratory failure with hypoxia ([place]) 02/09/2015  . Dyspnea and respiratory abnormality 01/03/2015  . Normal coronary arteries 11/02/2014  . Morbid obesity ([place]) 10/30/2014  . Dysphagia 08/21/2014  . Sore throat 07/18/2014  . Chronic fatigue 06/21/2014  . DOE (dyspnea on exertion) 05/24/2014  . High risk medication use 04/17/2014  . Aphasia 02/12/2014  . Type 2 diabetes  mellitus with sensory neuropathy ([place]) 01/18/2014  . Lumbar facet arthropathy 01/18/2014  . Acute bronchitis 07/24/2013  . Bilateral edema of lower extremity 05/30/2013  . Chronic cough 05/24/2013  . ILD (interstitial lung disease) ([place]) 04/10/2013  . Greater trochanteric bursitis of left hip 08/13/2012  . Spinal stenosis of lumbar region with radiculopathy 07/12/2012  . Inability to walk 07/12/2012  . Acute lumbar back pain 07/12/2012  . Asymptomatic bacteriuria 07/12/2012  . 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 ([place]) 02/17/2012  . Benign neoplasm of meninges ([place]) 02/12/2012  . Brain mass 01/28/2012  . Chest pain with moderate risk of acute coronary syndrome 01/28/2012  . Rheumatoid arthritis ([place]) 01/28/2012  . Primary osteoarthritis of right knee 01/28/2012  . Dyslipidemia   . Thyroid disease   . Essential hypertension    Past Medical History:  Diagnosis Date  . Acute on chronic diastolic (congestive) heart failure ([place])   . Anemia    iron deficiency anemia - secondary to blood loss ( chronic)   . Arthritis    endstage changes bilateral knees/bilateral ankles.   . Asthma   . Carotid artery occlusion   . Chronic fatigue   . Chronic kidney disease   . Closed left hip fracture ([place])   . Clotting disorder ([place])    pt denies this  . Contusion of left knee    due to fall 1/14.  [name] COPD (chronic obstructive pulmonary disease) (HCC)    pulmonary fibrosis  . Depression, reactive   . Diabetes mellitus    type II   . Diastolic dysfunction   . Difficulty in walking   . Family history of heart disease   . Generalized muscle weakness   . Gout   . High cholesterol   . History of falling   . Hypertension   . Hypothyroidism   . Interstitial lung disease ([place])   . 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    . Morbid obesity ([place])   . Morbid obesity ([place])   .  Neuromuscular disorder ([place])    diabetic neuropathy   . Normal coronary arteries    cardiac catheterization performed  10/31/14  . RA (rheumatoid arthritis) ([place])    has been off methotreaxte since 10/13.  [name] Rheumatoid arthritis ([place])   . Shortness of breath   . Spinal stenosis of lumbar region   . Thyroid disease   . Unspecified lack of coordination   . URI (upper respiratory infection)     Family History  Problem Relation Age of Onset  . Diabetes Mother   . Heart attack Mother   . Hypertension Father   . Lung cancer Father   . Diabetes Sister   . Diabetes Brother   . Hypertension Brother   . Heart disease Brother   . Heart attack Brother   . Kidney cancer Brother   .  Uterine cancer Daughter   . Breast cancer Sister   . Rheum arthritis Maternal Uncle   . Gout Brother   . Kidney failure Brother        x 5    Past Surgical History:  Procedure Laterality Date  . ABDOMINAL HYSTERECTOMY    . BRAIN SURGERY     Gamma knife 10/13. Needs repeat spring  '14  . CARDIAC CATHETERIZATION N/A 10/31/2014   Procedure: Right/Left Heart Cath and Coronary Angiography;  Surgeon: [name], MD;  Location: [place] [facility];  Service: Cardiovascular;  Laterality: N/A;  . ESOPHAGOGASTRODUODENOSCOPY (EGD) WITH PROPOFOL N/A 09/14/2014   Procedure: ESOPHAGOGASTRODUODENOSCOPY (EGD) WITH PROPOFOL;  Surgeon: [name], MD;  [facility];  Service: Endoscopy;  Laterality: N/A;  . INCISION AND DRAINAGE HIP Left 01/16/2017   Procedure: IRRIGATION AND DEBRIDEMENT LEFT HIP;  Surgeon: [name], MD;  [facility];  Service: Orthopedics;  Laterality: Left;  . INTRAMEDULLARY (IM) NAIL INTERTROCHANTERIC Left 11/29/2016   Procedure: INTRAMEDULLARY (IM) NAIL INTERTROCHANTRIC;  Surgeon: [name], MD;  Location: [place];  Service: Orthopedics;  Laterality: Left;  . OVARY SURGERY    . SHOULDER SURGERY Left   . TONSILLECTOMY  age 27  . VIDEO BRONCHOSCOPY  Bilateral 05/31/2013   Procedure: VIDEO BRONCHOSCOPY WITHOUT FLUORO;  Surgeon: Brand Males, MD;  Location: [place];  Service: Cardiopulmonary;  Laterality: Bilateral;  . video bronscoscopy  2000   lung   Social History   Occupational History  . Occupation: retired Marine scientist  Tobacco Use  . Smoking status: Never Smoker  . Smokeless tobacco: Never Used  Substance and Sexual Activity  . Alcohol use: No  . Drug use: No  . Sexual activity: Not Currently

## 2017-06-10 LAB — TOXASSURE SELECT,+ANTIDEPR,UR

## 2017-06-10 LAB — 6-ACETYLMORPHINE,TOXASSURE ADD
6-ACETYLMORPHINE: NEGATIVE
6-acetylmorphine: NOT DETECTED ng/mg creat

## 2017-06-17 ENCOUNTER — Telehealth: Payer: Self-pay | Admitting: *Deleted

## 2017-06-17 NOTE — Telephone Encounter (Signed)
Urine drug screen for this encounter is consistent for prescribed medication. This is a repeat after negative swab.

## 2017-06-19 DIAGNOSIS — J479 Bronchiectasis, uncomplicated: Secondary | ICD-10-CM | POA: Diagnosis not present

## 2017-06-30 ENCOUNTER — Encounter: Payer: Medicare Other | Attending: Physical Medicine & Rehabilitation | Admitting: Physical Medicine & Rehabilitation

## 2017-06-30 ENCOUNTER — Other Ambulatory Visit: Payer: Self-pay

## 2017-06-30 ENCOUNTER — Encounter: Payer: Self-pay | Admitting: Physical Medicine & Rehabilitation

## 2017-06-30 DIAGNOSIS — Z90710 Acquired absence of both cervix and uterus: Secondary | ICD-10-CM | POA: Insufficient documentation

## 2017-06-30 DIAGNOSIS — E1142 Type 2 diabetes mellitus with diabetic polyneuropathy: Secondary | ICD-10-CM | POA: Insufficient documentation

## 2017-06-30 DIAGNOSIS — M47816 Spondylosis without myelopathy or radiculopathy, lumbar region: Secondary | ICD-10-CM | POA: Diagnosis not present

## 2017-06-30 DIAGNOSIS — Z9889 Other specified postprocedural states: Secondary | ICD-10-CM | POA: Insufficient documentation

## 2017-06-30 DIAGNOSIS — M1712 Unilateral primary osteoarthritis, left knee: Secondary | ICD-10-CM

## 2017-06-30 DIAGNOSIS — M069 Rheumatoid arthritis, unspecified: Secondary | ICD-10-CM | POA: Insufficient documentation

## 2017-06-30 DIAGNOSIS — M1711 Unilateral primary osteoarthritis, right knee: Secondary | ICD-10-CM

## 2017-06-30 DIAGNOSIS — E1122 Type 2 diabetes mellitus with diabetic chronic kidney disease: Secondary | ICD-10-CM | POA: Insufficient documentation

## 2017-06-30 DIAGNOSIS — M48062 Spinal stenosis, lumbar region with neurogenic claudication: Secondary | ICD-10-CM | POA: Insufficient documentation

## 2017-06-30 DIAGNOSIS — F329 Major depressive disorder, single episode, unspecified: Secondary | ICD-10-CM | POA: Diagnosis not present

## 2017-06-30 MED ORDER — MORPHINE SULFATE 15 MG PO TABS: 15.0000 mg | ORAL_TABLET | Freq: Two times a day (BID) | ORAL | 0 refills | Status: DC | PRN

## 2017-06-30 MED ORDER — MORPHINE SULFATE ER 15 MG PO TBCR: 15.0000 mg | EXTENDED_RELEASE_TABLET | Freq: Two times a day (BID) | ORAL | 0 refills | Status: DC

## 2017-06-30 NOTE — Patient Instructions (Signed)
PLEASE FEEL FREE TO CALL OUR OFFICE WITH ANY PROBLEMS OR QUESTIONS ([phone])      

## 2017-06-30 NOTE — Progress Notes (Signed)
Subjective:    Patient ID: [name], female    DOB: October 20, 1940, 77 y.o.   MRN: [id number]  HPI   [name] is here in follow-up of her chronic pain. She continues to deal with low back and left hip pain. Dr. Rush Farmer performed another xR which was stable per patient report.  The left hip seems to bother her quite a bit with weightbearing as well as when she is sitting down.  She cannot sleep on the left side due to pain.  She does some minor stretches at home but nothing consistent.  Ice seems to exacerbate the symptoms.  She remains on MS Contin controlled release as well as immediate release for pain control.  She states that her lungs have been functioning a bit better.  She is only using oxygen when she needs it.  She does have rheumatology follow-up scheduled in the near future.   Pain Inventory Average Pain 7 Pain Right Now 5 My pain is sharp, burning and stabbing  In the last 24 hours, has pain interfered with the following? General activity 8 Relation with others 8 Enjoyment of life 8 What TIME of day is your pain at its worst? morning and night Sleep (in general) Fair  Pain is worse with: walking, standing and some activites Pain improves with: rest, heat/ice, therapy/exercise, pacing activities and medication Relief from Meds: 7  Mobility use a cane use a walker how many minutes can you walk? 5 ability to climb steps?  no do you drive?  no  Function retired I need assistance with the following:  meal prep, household duties and shopping  Neuro/Psych bladder control problems tingling spasms  Prior Studies Any changes since last visit?  no  Physicians involved in your care Any changes since last visit?  no   Family History  Problem Relation Age of Onset  . Diabetes Mother   . Heart attack Mother   . Hypertension Father   . Lung cancer Father   . Diabetes Sister   . Diabetes Brother   . Hypertension Brother   . Heart disease Brother   .  Heart attack Brother   . Kidney cancer Brother   . Uterine cancer Daughter   . Breast cancer Sister   . Rheum arthritis Maternal Uncle   . Gout Brother   . Kidney failure Brother        x 5   Social History   Socioeconomic History  . Marital status: Married    Spouse name: None  . Number of children: 6  . Years of education: college  . Highest education level: None  Social Needs  . Financial resource strain: None  . Food insecurity - worry: None  . Food insecurity - inability: None  . Transportation needs - medical: None  . Transportation needs - non-medical: None  Occupational History  . Occupation: retired Marine scientist  Tobacco Use  . Smoking status: Never Smoker  . Smokeless tobacco: Never Used  Substance and Sexual Activity  . Alcohol use: No  . Drug use: No  . Sexual activity: Not Currently  Other Topics Concern  . None  Social History Narrative   Patient consumes 2-3 cups coffee per day.   Past Surgical History:  Procedure Laterality Date  . ABDOMINAL HYSTERECTOMY    . BRAIN SURGERY     Gamma knife 10/13. Needs repeat spring  '14  . CARDIAC CATHETERIZATION N/A 10/31/2014   Procedure: Right/Left Heart Cath and Coronary Angiography;  Surgeon: [name], MD;  Location: [facility];  Service: Cardiovascular;  Laterality: N/A;  . ESOPHAGOGASTRODUODENOSCOPY (EGD) WITH PROPOFOL N/A 09/14/2014   Procedure: ESOPHAGOGASTRODUODENOSCOPY (EGD) WITH PROPOFOL;  Surgeon: [name], MD;  [facility];  Service: Endoscopy;  Laterality: N/A;  . INCISION AND DRAINAGE HIP Left 01/16/2017   Procedure: IRRIGATION AND DEBRIDEMENT LEFT HIP;  Surgeon: [name], MD;  Location: [facility];  Service: Orthopedics;  Laterality: Left;  . INTRAMEDULLARY (IM) NAIL INTERTROCHANTERIC Left 11/29/2016   Procedure: INTRAMEDULLARY (IM) NAIL INTERTROCHANTRIC;  Surgeon: [name], MD;  Location: [facility];  Service: Orthopedics;  Laterality: Left;  . OVARY  SURGERY    . SHOULDER SURGERY Left   . TONSILLECTOMY  age 74  . VIDEO BRONCHOSCOPY Bilateral 05/31/2013   Procedure: VIDEO BRONCHOSCOPY WITHOUT FLUORO;  Surgeon: Brand Males, MD;  Location: [facility];  Service: Cardiopulmonary;  Laterality: Bilateral;  . video bronscoscopy  2000   lung   Past Medical History:  Diagnosis Date  . Acute on chronic diastolic (congestive) heart failure ([place])   . Anemia    iron deficiency anemia - secondary to blood loss ( chronic)   . Arthritis    endstage changes bilateral knees/bilateral ankles.   . Asthma   . Carotid artery occlusion   . Chronic fatigue   . Chronic kidney disease   . Closed left hip fracture ([place])   . Clotting disorder ([place])    pt denies this  . Contusion of left knee    due to fall 1/14.  [name] COPD (chronic obstructive pulmonary disease) (HCC)    pulmonary fibrosis  . Depression, reactive   . Diabetes mellitus    type II   . Diastolic dysfunction   . Difficulty in walking   . Family history of heart disease   . Generalized muscle weakness   . Gout   . High cholesterol   . History of falling   . Hypertension   . Hypothyroidism   . Interstitial lung disease ([place])   . 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    . Morbid obesity ([place])   . Morbid obesity ([place])   . Neuromuscular disorder ([place])    diabetic neuropathy   . Normal coronary arteries    cardiac catheterization performed  10/31/14  . RA (rheumatoid arthritis) ([place])    has been off methotreaxte since 10/13.  [name] Rheumatoid arthritis ([place])   . Shortness of breath   . Spinal stenosis of lumbar region   . Thyroid disease   . Unspecified lack of coordination   . URI (upper respiratory infection)    BP 114/71   Pulse (!) 107   SpO2 92% Comment: wears o2 at night  Opioid Risk Score:  0 Fall Risk Score:  `1  Depression screen PHQ 2/9  Depression screen [facility] 2/9  06/30/2017 06/03/2017 05/07/2017 04/06/2017 03/10/2017 03/02/2017 02/26/2017  Decreased Interest 0 0 0 1 0 0 0  Down, Depressed, Hopeless 0 0 0 1 0 0 0  PHQ - 2 Score 0 0 0 2 0 0 0  Altered sleeping - - - - - - -  Tired, decreased energy - - - - - - -  Change in appetite - - - - - - -  Feeling bad or failure about yourself  - - - - - - -  Trouble concentrating - - - - - - -  Moving slowly or fidgety/restless - - - - - - -  Suicidal thoughts - - - - - - -  PHQ-9 Score - - - - - - -  Difficult doing work/chores - - - - - - -  Some recent data might be hidden    Review of Systems  Constitutional: Positive for chills and diaphoresis.  HENT: Negative.   Eyes: Negative.   Respiratory: Positive for shortness of breath.        Infections  Cardiovascular: Negative.   Gastrointestinal: Negative.   Endocrine: Negative.   Genitourinary:       Bladder control  Musculoskeletal:       Spasms  Skin: Negative.   Allergic/Immunologic: Negative.   Neurological:       Tingling  Hematological: Negative.   Psychiatric/Behavioral: Negative.   All other systems reviewed and are negative.      Objective:   Physical Exam General: Alert and oriented x 3, obese HEENT:Head is normocephalic, atraumatic, PERRLA, EOMI, sclera anicteric, oral mucosa pink and moist, dentition intact, ext ear canals clear,  Neck:Supple without JVD or lymphadenopathy  Heart: RRR Chest:clear. Not on Oxygen today Abdomen:Soft, non-tender, non-distended, bowel sounds positive.  Extremities:No clubbing, cyanosis, 2+ edema. Pulses are 2+  Skin:wound about 3cm in diameter left shin. Neuro:Pt is cognitively appropriate with normal insight, memory, and awareness. Cranial nerves 2-12 are intact. Sensory exam is near normal inbothlegs... Reflexes are 2+ in all 4's. Fine motor coordination is intact. No tremors. Motor function is grossly 4 to 5/5 with pain inhbition.  Musculoskeletal: Ambulates with left-sided antalgia  slightly wide-based gait.  Does  appear to be generally stable.  Transfers fairly easily.. Full ROM at left knee. Valgus deformity right knee. Pain with palpation of left greater troch. Antalgic with wb LLE.  Psych:Pt's affect is appropriate. Pt is cooperative   Assessment & Plan:  Assessment:  1. Lumbar spinal stenosis with neurogenic claudication. Associated facet arthropathy  2. Depression.  3. Diabetes mellitus type 2 with polyneuropathy  4. Rheumatoid arthritis and osteoarthritis. Multiple joint involvement including knees.  5. Left greater troch bursitis  6. Interstitial lung disease  7. Hx of left hip fracture, post-op infection in July 2017.   Patient with ongoing left hip pain along the lateral aspect/greater trochanter.  I wonder if she may be dealing with a bursitis and inflammation of her TFL along with tight hamstrings.   Plan:  1.Discussed HEP to help stretch her hamstrings and TFL. Exercises were provided and described in detail today 2. Voltaren gel for fingers/knees.injections prn 3. MSO4 for breakthrough pain, one q8 prn #90--  -MS Contin 48m CR q12 #60. -We will continue the opioid monitoring program, this consists of regular clinic visits, examinations, routine drug screening, pill counts [place].    4 .Continue with wound care per wound care clinic. 5. Continue cymbalta  640mdaily.  6. Follow up withNP in [id number] of face to face patient care time were spent during this visit. All questions were encouraged and answered.

## 2017-07-23 DIAGNOSIS — E039 Hypothyroidism, unspecified: Secondary | ICD-10-CM | POA: Diagnosis not present

## 2017-07-23 DIAGNOSIS — N08 Glomerular disorders in diseases classified elsewhere: Secondary | ICD-10-CM | POA: Diagnosis not present

## 2017-07-23 DIAGNOSIS — R7309 Other abnormal glucose: Secondary | ICD-10-CM | POA: Diagnosis not present

## 2017-07-23 DIAGNOSIS — I130 Hypertensive heart and chronic kidney disease with heart failure and stage 1 through stage 4 chronic kidney disease, or unspecified chronic kidney disease: Secondary | ICD-10-CM | POA: Diagnosis not present

## 2017-07-23 DIAGNOSIS — J8489 Other specified interstitial pulmonary diseases: Secondary | ICD-10-CM | POA: Diagnosis not present

## 2017-07-28 ENCOUNTER — Other Ambulatory Visit: Payer: Self-pay

## 2017-07-28 ENCOUNTER — Encounter: Payer: Medicare Other | Attending: Physical Medicine & Rehabilitation | Admitting: Registered Nurse

## 2017-07-28 ENCOUNTER — Encounter: Payer: Self-pay | Admitting: Registered Nurse

## 2017-07-28 VITALS — BP 104/70 | HR 95

## 2017-07-28 DIAGNOSIS — M5416 Radiculopathy, lumbar region: Secondary | ICD-10-CM

## 2017-07-28 DIAGNOSIS — M62838 Other muscle spasm: Secondary | ICD-10-CM | POA: Diagnosis not present

## 2017-07-28 DIAGNOSIS — M1712 Unilateral primary osteoarthritis, left knee: Secondary | ICD-10-CM

## 2017-07-28 DIAGNOSIS — E1122 Type 2 diabetes mellitus with diabetic chronic kidney disease: Secondary | ICD-10-CM | POA: Insufficient documentation

## 2017-07-28 DIAGNOSIS — Z5181 Encounter for therapeutic drug level monitoring: Secondary | ICD-10-CM | POA: Diagnosis not present

## 2017-07-28 DIAGNOSIS — E1142 Type 2 diabetes mellitus with diabetic polyneuropathy: Secondary | ICD-10-CM | POA: Insufficient documentation

## 2017-07-28 DIAGNOSIS — G894 Chronic pain syndrome: Secondary | ICD-10-CM

## 2017-07-28 DIAGNOSIS — Z9889 Other specified postprocedural states: Secondary | ICD-10-CM | POA: Insufficient documentation

## 2017-07-28 DIAGNOSIS — M069 Rheumatoid arthritis, unspecified: Secondary | ICD-10-CM | POA: Insufficient documentation

## 2017-07-28 DIAGNOSIS — Z79899 Other long term (current) drug therapy: Secondary | ICD-10-CM | POA: Diagnosis not present

## 2017-07-28 DIAGNOSIS — E1140 Type 2 diabetes mellitus with diabetic neuropathy, unspecified: Secondary | ICD-10-CM

## 2017-07-28 DIAGNOSIS — F329 Major depressive disorder, single episode, unspecified: Secondary | ICD-10-CM | POA: Insufficient documentation

## 2017-07-28 DIAGNOSIS — M48062 Spinal stenosis, lumbar region with neurogenic claudication: Secondary | ICD-10-CM | POA: Insufficient documentation

## 2017-07-28 DIAGNOSIS — M1711 Unilateral primary osteoarthritis, right knee: Secondary | ICD-10-CM

## 2017-07-28 DIAGNOSIS — M47816 Spondylosis without myelopathy or radiculopathy, lumbar region: Secondary | ICD-10-CM

## 2017-07-28 DIAGNOSIS — Z90710 Acquired absence of both cervix and uterus: Secondary | ICD-10-CM | POA: Insufficient documentation

## 2017-07-28 DIAGNOSIS — M2550 Pain in unspecified joint: Secondary | ICD-10-CM | POA: Diagnosis not present

## 2017-07-28 NOTE — Progress Notes (Signed)
Subjective:    Patient ID: [name], female    DOB: [birth year], 77 y.o.   MRN: [id number]  HPI: [name] is a 77 year old female who returns for follow up appointment for chronic painand medication refill. She states her pain is located in her upper-lower back radiating into her bilateral lower extremities also reports bilateral knee pain. She rates her pain 6. Her current exercise regime is performing chair exercises with bands and walking short distances with walker.  [name] Morphine equivalent is  38.00 MM/ monthly.    [name] last UDS  Was performed on 06/03/2017 it was consistent.    Pain Inventory Average Pain 7 Pain Right Now 6 My pain is sharp, stabbing and aching  In the last 24 hours, has pain interfered with the following? General activity 7 Relation with others 7 Enjoyment of life 8 What TIME of day is your pain at its worst? morning Sleep (in general) Fair  Pain is worse with: walking, bending and standing Pain improves with: heat/ice, pacing activities and medication Relief from Meds: no ans  Mobility use a cane use a walker how many minutes can you walk? 8 ability to climb steps?  no do you drive?  no use a wheelchair Do you have any goals in this area?  no  Function retired I need assistance with the following:  meal prep, household duties and shopping  Neuro/Psych bladder control problems numbness tingling trouble walking spasms anxiety  Prior Studies Any changes since last visit?  no  Physicians involved in your care Any changes since last visit?  no   Family History  Problem Relation Age of Onset  . Diabetes Mother   . Heart attack Mother   . Hypertension Father   . Lung cancer Father   . Diabetes Sister   . Diabetes Brother   . Hypertension Brother   . Heart disease Brother   . Heart attack Brother   . Kidney cancer Brother   . Uterine cancer Daughter   . Breast cancer Sister   . Rheum  arthritis Maternal Uncle   . Gout Brother   . Kidney failure Brother        x 5   Social History   Socioeconomic History  . Marital status: Married    Spouse name: Not on file  . Number of children: 6  . Years of education: college  . Highest education level: Not on file  Social Needs  . Financial resource strain: Not on file  . Food insecurity - worry: Not on file  . Food insecurity - inability: Not on file  . Transportation needs - medical: Not on file  . Transportation needs - non-medical: Not on file  Occupational History  . Occupation: retired Marine scientist  Tobacco Use  . Smoking status: Never Smoker  . Smokeless tobacco: Never Used  Substance and Sexual Activity  . Alcohol use: No  . Drug use: No  . Sexual activity: Not Currently  Other Topics Concern  . Not on file  Social History Narrative   Patient consumes 2-3 cups coffee per day.   Past Surgical History:  Procedure Laterality Date  . ABDOMINAL HYSTERECTOMY    . BRAIN SURGERY     Gamma knife 10/13. Needs repeat spring  '14  . CARDIAC CATHETERIZATION N/A 10/31/2014   Procedure: Right/Left Heart Cath and Coronary Angiography;  Surgeon: [name], MD;  Location: [facility];  Service: Cardiovascular;  Laterality:  N/A;  . ESOPHAGOGASTRODUODENOSCOPY (EGD) WITH PROPOFOL N/A 09/14/2014   Procedure: ESOPHAGOGASTRODUODENOSCOPY (EGD) WITH PROPOFOL;  Surgeon: [name], MD;  [facility];  Service: Endoscopy;  Laterality: N/A;  . INCISION AND DRAINAGE HIP Left 01/16/2017   Procedure: IRRIGATION AND DEBRIDEMENT LEFT HIP;  Surgeon: [name], MD;  [facility];  Service: Orthopedics;  Laterality: Left;  . INTRAMEDULLARY (IM) NAIL INTERTROCHANTERIC Left 11/29/2016   Procedure: INTRAMEDULLARY (IM) NAIL INTERTROCHANTRIC;  Surgeon: [name], MD;  Location: [facility];  Service: Orthopedics;  Laterality: Left;  . OVARY SURGERY    . SHOULDER SURGERY Left   . TONSILLECTOMY  age  72  . VIDEO BRONCHOSCOPY Bilateral 05/31/2013   Procedure: VIDEO BRONCHOSCOPY WITHOUT FLUORO;  Surgeon: Brand Males, MD;  Location: [facility];  Service: Cardiopulmonary;  Laterality: Bilateral;  . video bronscoscopy  2000   lung   Past Medical History:  Diagnosis Date  . Acute on chronic diastolic (congestive) heart failure ([place])   . Anemia    iron deficiency anemia - secondary to blood loss ( chronic)   . Arthritis    endstage changes bilateral knees/bilateral ankles.   . Asthma   . Carotid artery occlusion   . Chronic fatigue   . Chronic kidney disease   . Closed left hip fracture ([place])   . Clotting disorder ([place])    pt denies this  . Contusion of left knee    due to fall 1/14.  [name] COPD (chronic obstructive pulmonary disease) (HCC)    pulmonary fibrosis  . Depression, reactive   . Diabetes mellitus    type II   . Diastolic dysfunction   . Difficulty in walking   . Family history of heart disease   . Generalized muscle weakness   . Gout   . High cholesterol   . History of falling   . Hypertension   . Hypothyroidism   . Interstitial lung disease ([place])   . 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    . Morbid obesity ([place])   . Morbid obesity ([place])   . Neuromuscular disorder ([place])    diabetic neuropathy   . Normal coronary arteries    cardiac catheterization performed  10/31/14  . RA (rheumatoid arthritis) ([place])    has been off methotreaxte since 10/13.  [name] Rheumatoid arthritis ([place])   . Shortness of breath   . Spinal stenosis of lumbar region   . Thyroid disease   . Unspecified lack of coordination   . URI (upper respiratory infection)    BP 104/70   Pulse 95   SpO2 92%   Opioid Risk Score:  0 Fall Risk Score:  `1  Depression screen PHQ 2/9  Depression screen [facility] 2/9 07/28/2017 06/30/2017 06/03/2017 05/07/2017 04/06/2017 03/10/2017 03/02/2017  Decreased Interest 0 0  0 0 1 0 0  Down, Depressed, Hopeless 0 0 0 0 1 0 0  PHQ - 2 Score 0 0 0 0 2 0 0  Altered sleeping - - - - - - -  Tired, decreased energy - - - - - - -  Change in appetite - - - - - - -  Feeling bad or failure about yourself  - - - - - - -  Trouble concentrating - - - - - - -  Moving slowly or fidgety/restless - - - - - - -  Suicidal thoughts - - - - - - -  PHQ-9 Score - - - - - - -  Difficult doing work/chores - - - - - - -  Some recent data might be hidden    Review of Systems  Constitutional: Positive for chills and diaphoresis.  HENT: Negative.   Eyes: Negative.   Respiratory: Positive for shortness of breath.   Cardiovascular: Positive for leg swelling.  Gastrointestinal: Negative.   Endocrine: Negative.   Genitourinary: Positive for urgency.  Musculoskeletal: Positive for gait problem.  Skin: Negative.   Allergic/Immunologic: Negative.   Neurological: Positive for weakness and numbness.  Hematological: Negative.   Psychiatric/Behavioral: The patient is nervous/anxious.   All other systems reviewed and are negative.      Objective:   Physical Exam  Constitutional: She is oriented to person, place, and time. She appears well-developed and well-nourished.  HENT:  Head: Normocephalic and atraumatic.  Neck: Normal range of motion. Neck supple.  Cardiovascular: Normal rate and regular rhythm.  Pulmonary/Chest: Effort normal and breath sounds normal.  Musculoskeletal:  Normal Muscle Bulk and Muscle Testing Reveals: Upper Extremities: Full ROM and Muscle Strength 4/5 Thoracic and Lumbar Hypersensitivity Bilateral Greater Trochanter Tenderness: L>R  Lower Extremities: Decreased ROM and Muscle Strength 5/5 Bilateral Lower Extremities Flexion Produces Pain into Bilateral Patella's Arises from Table slowly, using walker for support Narrow Based Gait  Neurological: She is alert and oriented to person, place, and time.  Skin: Skin is warm and dry.  Psychiatric: She has a  normal mood and affect.  Nursing note and vitals reviewed.         Assessment & Plan:  1. Lumbar spinal stenosis with neurogenic claudication. Associated facet arthropathy: Continue Gabapentinand HEP as tolerated. 07/28/2017 Continue: MS Contin 15 mg one tablet every 12 hours #60and continueMSIR 15 one tablet twice a day as needed ( 6 am and 1:00 pm)  #60. We will continue the opioid monitoring program, this consists of regular clinic visits, examinations, urine drug screen, pill counts as well as use of [place] Controlled Substance reporting System. 2. Depression: Continue Cymbalta. 07/28/2017 3. Diabetes mellitus type 2 with polyneuropathy: Continue Gabapentin. 07/28/2017 4. Rheumatoid arthritis and osteoarthritis. Contnue Voltaren Gel. Rheumatology Following. 07/28/2017 5. Interstitial lung disease: Pulmonology Following.07/28/2017. 6. Bilateral Osteoarthritis Knee's: Continue Voltaren Gel.07/28/2017 7. Muscle Spasm: Continue : Flexeril. 07/28/2017 8. Left Hip Pain: F/U with Dr. Rush Farmer Left Greater Trochanteric Tenderness: Continue with Ice/Heat Therapy. 07/28/2017. 9. Polyarthralgia: Rheumatology Following: Continue to monitor.07/28/2017 10. Morbid Obesitity: Continue Healthy Diet Regime and HEP. 07/28/2017  20 minutes of face to face patient care time was spent during this visit. All questions were encouraged and answered.   F/U in 1 month

## 2017-07-30 DIAGNOSIS — H524 Presbyopia: Secondary | ICD-10-CM | POA: Diagnosis not present

## 2017-07-30 DIAGNOSIS — H52203 Unspecified astigmatism, bilateral: Secondary | ICD-10-CM | POA: Diagnosis not present

## 2017-07-30 DIAGNOSIS — H2513 Age-related nuclear cataract, bilateral: Secondary | ICD-10-CM | POA: Diagnosis not present

## 2017-07-30 DIAGNOSIS — H25013 Cortical age-related cataract, bilateral: Secondary | ICD-10-CM | POA: Diagnosis not present

## 2017-08-05 DIAGNOSIS — Z79899 Other long term (current) drug therapy: Secondary | ICD-10-CM | POA: Diagnosis not present

## 2017-08-05 DIAGNOSIS — M059 Rheumatoid arthritis with rheumatoid factor, unspecified: Secondary | ICD-10-CM | POA: Diagnosis not present

## 2017-08-05 DIAGNOSIS — M1A00X0 Idiopathic chronic gout, unspecified site, without tophus (tophi): Secondary | ICD-10-CM | POA: Diagnosis not present

## 2017-08-12 DIAGNOSIS — H43811 Vitreous degeneration, right eye: Secondary | ICD-10-CM | POA: Diagnosis not present

## 2017-08-12 DIAGNOSIS — H2013 Chronic iridocyclitis, bilateral: Secondary | ICD-10-CM | POA: Diagnosis not present

## 2017-08-12 DIAGNOSIS — H35412 Lattice degeneration of retina, left eye: Secondary | ICD-10-CM | POA: Diagnosis not present

## 2017-08-12 DIAGNOSIS — E119 Type 2 diabetes mellitus without complications: Secondary | ICD-10-CM | POA: Diagnosis not present

## 2017-08-12 DIAGNOSIS — Z79899 Other long term (current) drug therapy: Secondary | ICD-10-CM | POA: Diagnosis not present

## 2017-08-17 ENCOUNTER — Telehealth: Payer: Self-pay | Admitting: *Deleted

## 2017-08-17 NOTE — Telephone Encounter (Signed)
I called and spoke with [name] but she was not at home at the time. Zella Ball has asked that she count her Joan Contin and MSIR and let her know how many she has.  She will call us back when she is home later.

## 2017-08-21 ENCOUNTER — Ambulatory Visit: Payer: Medicare Other | Admitting: Podiatry

## 2017-08-26 ENCOUNTER — Encounter: Payer: Medicare Other | Attending: Physical Medicine & Rehabilitation | Admitting: Registered Nurse

## 2017-08-26 ENCOUNTER — Encounter: Payer: Self-pay | Admitting: Registered Nurse

## 2017-08-26 VITALS — BP 152/91 | HR 95 | Ht 62.0 in | Wt 270.0 lb

## 2017-08-26 DIAGNOSIS — M5416 Radiculopathy, lumbar region: Secondary | ICD-10-CM

## 2017-08-26 DIAGNOSIS — E1140 Type 2 diabetes mellitus with diabetic neuropathy, unspecified: Secondary | ICD-10-CM | POA: Diagnosis not present

## 2017-08-26 DIAGNOSIS — G894 Chronic pain syndrome: Secondary | ICD-10-CM

## 2017-08-26 DIAGNOSIS — M2550 Pain in unspecified joint: Secondary | ICD-10-CM

## 2017-08-26 DIAGNOSIS — M47816 Spondylosis without myelopathy or radiculopathy, lumbar region: Secondary | ICD-10-CM

## 2017-08-26 DIAGNOSIS — E1122 Type 2 diabetes mellitus with diabetic chronic kidney disease: Secondary | ICD-10-CM | POA: Diagnosis not present

## 2017-08-26 DIAGNOSIS — Z9889 Other specified postprocedural states: Secondary | ICD-10-CM | POA: Diagnosis not present

## 2017-08-26 DIAGNOSIS — M1711 Unilateral primary osteoarthritis, right knee: Secondary | ICD-10-CM | POA: Diagnosis not present

## 2017-08-26 DIAGNOSIS — F329 Major depressive disorder, single episode, unspecified: Secondary | ICD-10-CM | POA: Diagnosis not present

## 2017-08-26 DIAGNOSIS — E1142 Type 2 diabetes mellitus with diabetic polyneuropathy: Secondary | ICD-10-CM | POA: Diagnosis not present

## 2017-08-26 DIAGNOSIS — Z90710 Acquired absence of both cervix and uterus: Secondary | ICD-10-CM | POA: Diagnosis not present

## 2017-08-26 DIAGNOSIS — R5382 Chronic fatigue, unspecified: Secondary | ICD-10-CM

## 2017-08-26 DIAGNOSIS — M62838 Other muscle spasm: Secondary | ICD-10-CM

## 2017-08-26 DIAGNOSIS — M7061 Trochanteric bursitis, right hip: Secondary | ICD-10-CM

## 2017-08-26 DIAGNOSIS — M48062 Spinal stenosis, lumbar region with neurogenic claudication: Secondary | ICD-10-CM | POA: Diagnosis not present

## 2017-08-26 DIAGNOSIS — Z79899 Other long term (current) drug therapy: Secondary | ICD-10-CM

## 2017-08-26 DIAGNOSIS — M069 Rheumatoid arthritis, unspecified: Secondary | ICD-10-CM | POA: Diagnosis not present

## 2017-08-26 DIAGNOSIS — Z5181 Encounter for therapeutic drug level monitoring: Secondary | ICD-10-CM

## 2017-08-26 DIAGNOSIS — M1712 Unilateral primary osteoarthritis, left knee: Secondary | ICD-10-CM | POA: Diagnosis not present

## 2017-08-26 DIAGNOSIS — M7062 Trochanteric bursitis, left hip: Secondary | ICD-10-CM | POA: Diagnosis not present

## 2017-08-26 MED ORDER — DICLOFENAC SODIUM 1 % TD GEL: 1.0000 "application " | Freq: Three times a day (TID) | TRANSDERMAL | 4 refills | Status: DC

## 2017-08-26 MED ORDER — CYCLOBENZAPRINE HCL 5 MG PO TABS: 5.0000 mg | ORAL_TABLET | Freq: Every evening | ORAL | 3 refills | Status: DC | PRN

## 2017-08-26 MED ORDER — PANTOPRAZOLE SODIUM 40 MG PO TBEC: DELAYED_RELEASE_TABLET | ORAL | 0 refills | Status: DC

## 2017-08-26 MED ORDER — MORPHINE SULFATE 15 MG PO TABS: 15.0000 mg | ORAL_TABLET | Freq: Two times a day (BID) | ORAL | 0 refills | Status: DC | PRN

## 2017-08-26 MED ORDER — DULOXETINE HCL 60 MG PO CPEP
60.0000 mg | ORAL_CAPSULE | Freq: Every day | ORAL | 3 refills | Status: DC
Start: 2017-08-26 — End: 2018-10-12

## 2017-08-26 MED ORDER — MORPHINE SULFATE ER 15 MG PO TBCR: 15.0000 mg | EXTENDED_RELEASE_TABLET | Freq: Two times a day (BID) | ORAL | 0 refills | Status: DC

## 2017-08-26 NOTE — Progress Notes (Signed)
Subjective:    Patient ID: [name], female    DOB: December 21, 1940, 77 y.o.   MRN: [id number]  HPI: [name] is a 77 year old female who returns for follow up appointment for chronic pain and medication refill. She states her pain is located in her left shoulder, mid-lower back, bilateral hips and bilateral knees. She rates her pain 5. Her current exercise regime is performing stretching exercises and walking.   Ms. Kozma Morphine Equivalent is 42.00  Last UDS was Performed on 06/03/2017, it was consistent.   Pain Inventory Average Pain 7 Pain Right Now 5 My pain is sharp, burning and dull  In the last 24 hours, has pain interfered with the following? General activity 8 Relation with others 8 Enjoyment of life 8 What TIME of day is your pain at its worst? morning Sleep (in general) Fair  Pain is worse with: walking, bending, sitting, standing and some activites Pain improves with: rest, heat/ice, therapy/exercise, medication and injections Relief from Meds: 7  Mobility walk with assistance use a cane use a walker how many minutes can you walk? 2-3 ability to climb steps?  no use a wheelchair  Function retired I need assistance with the following:  dressing, bathing, meal prep, household duties and shopping  Neuro/Psych bladder control problems weakness numbness tingling trouble walking spasms  Prior Studies Any changes since last visit?  no  Physicians involved in your care Any changes since last visit?  no   Family History  Problem Relation Age of Onset  . Diabetes Mother   . Heart attack Mother   . Hypertension Father   . Lung cancer Father   . Diabetes Sister   . Diabetes Brother   . Hypertension Brother   . Heart disease Brother   . Heart attack Brother   . Kidney cancer Brother   . Uterine cancer Daughter   . Breast cancer Sister   . Rheum arthritis Maternal Uncle   . Gout Brother   . Kidney failure Brother        x 5    Social History   Socioeconomic History  . Marital status: Married    Spouse name: Not on file  . Number of children: 6  . Years of education: college  . Highest education level: Not on file  Occupational History  . Occupation: retired Ship broker  . Financial resource strain: Not on file  . Food insecurity:    Worry: Not on file    Inability: Not on file  . Transportation needs:    Medical: Not on file    Non-medical: Not on file  Tobacco Use  . Smoking status: Never Smoker  . Smokeless tobacco: Never Used  Substance and Sexual Activity  . Alcohol use: No  . Drug use: No  . Sexual activity: Not Currently  Lifestyle  . Physical activity:    Days per week: Not on file    Minutes per session: Not on file  . Stress: Not on file  Relationships  . Social connections:    Talks on phone: Not on file    Gets together: Not on file    Attends religious service: Not on file    Active member of club or organization: Not on file    Attends meetings of clubs or organizations: Not on file    Relationship status: Not on file  Other Topics Concern  . Not on file  Social History Narrative  Patient consumes 2-3 cups coffee per day.   Past Surgical History:  Procedure Laterality Date  . ABDOMINAL HYSTERECTOMY    . BRAIN SURGERY     Gamma knife 10/13. Needs repeat spring  '14  . CARDIAC CATHETERIZATION N/A 10/31/2014   Procedure: Right/Left Heart Cath and Coronary Angiography;  Surgeon: [name], MD;  Location: [facility];  Service: Cardiovascular;  Laterality: N/A;  . ESOPHAGOGASTRODUODENOSCOPY (EGD) WITH PROPOFOL N/A 09/14/2014   Procedure: ESOPHAGOGASTRODUODENOSCOPY (EGD) WITH PROPOFOL;  Surgeon: [name], MD;  [facility];  Service: Endoscopy;  Laterality: N/A;  . INCISION AND DRAINAGE HIP Left 01/16/2017   Procedure: IRRIGATION AND DEBRIDEMENT LEFT HIP;  Surgeon: [name], MD;  Location: [facility];  Service: Orthopedics;   Laterality: Left;  . INTRAMEDULLARY (IM) NAIL INTERTROCHANTERIC Left 11/29/2016   Procedure: INTRAMEDULLARY (IM) NAIL INTERTROCHANTRIC;  Surgeon: [name], MD;  Location: [facility];  Service: Orthopedics;  Laterality: Left;  . OVARY SURGERY    . SHOULDER SURGERY Left   . TONSILLECTOMY  age 38  . VIDEO BRONCHOSCOPY Bilateral 05/31/2013   Procedure: VIDEO BRONCHOSCOPY WITHOUT FLUORO;  Surgeon: Brand Males, MD;  Location: [facility];  Service: Cardiopulmonary;  Laterality: Bilateral;  . video bronscoscopy  2000   lung   Past Medical History:  Diagnosis Date  . Acute on chronic diastolic (congestive) heart failure ([place])   . Anemia    iron deficiency anemia - secondary to blood loss ( chronic)   . Arthritis    endstage changes bilateral knees/bilateral ankles.   . Asthma   . Carotid artery occlusion   . Chronic fatigue   . Chronic kidney disease   . Closed left hip fracture ([place])   . Clotting disorder ([place])    pt denies this  . Contusion of left knee    due to fall 1/14.  [name] COPD (chronic obstructive pulmonary disease) (HCC)    pulmonary fibrosis  . Depression, reactive   . Diabetes mellitus    type II   . Diastolic dysfunction   . Difficulty in walking   . Family history of heart disease   . Generalized muscle weakness   . Gout   . High cholesterol   . History of falling   . Hypertension   . Hypothyroidism   . Interstitial lung disease ([place])   . 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    . Morbid obesity ([place])   . Morbid obesity ([place])   . Neuromuscular disorder ([place])    diabetic neuropathy   . Normal coronary arteries    cardiac catheterization performed  10/31/14  . RA (rheumatoid arthritis) ([place])    has been off methotreaxte since 10/13.  [name] Rheumatoid arthritis ([place])   . Shortness of breath   . Spinal stenosis of lumbar region   . Thyroid disease   .  Unspecified lack of coordination   . URI (upper respiratory infection)    BP (!) 152/91 (BP Location: Right Wrist, Patient Position: Sitting, Cuff Size: Normal)   Pulse 95   Ht _0  (1.575 m)   Wt 270 lb (122.5 kg)   SpO2 90%   BMI 49.38 kg/m   Opioid Risk Score:   Fall Risk Score:  `1  Depression screen PHQ 2/9  Depression screen [facility] 2/9 07/28/2017 06/30/2017 06/03/2017 05/07/2017 04/06/2017 03/10/2017 03/02/2017  Decreased Interest 0 0 0 0 1 0  0  Down, Depressed, Hopeless 0 0 0 0 1 0 0  PHQ - 2 Score 0 0 0 0 2 0 0  Altered sleeping - - - - - - -  Tired, decreased energy - - - - - - -  Change in appetite - - - - - - -  Feeling bad or failure about yourself  - - - - - - -  Trouble concentrating - - - - - - -  Moving slowly or fidgety/restless - - - - - - -  Suicidal thoughts - - - - - - -  PHQ-9 Score - - - - - - -  Difficult doing work/chores - - - - - - -  Some recent data might be hidden    Review of Systems  Constitutional: Positive for chills, diaphoresis and fever.  HENT: Negative.   Eyes: Negative.   Respiratory: Negative.   Cardiovascular: Positive for leg swelling.  Gastrointestinal: Positive for constipation.  Endocrine: Negative.   Genitourinary: Negative.   Musculoskeletal: Positive for arthralgias, back pain and gait problem.       Spasms   Skin: Negative.   Allergic/Immunologic: Negative.   Neurological:       Tingling   Hematological: Negative.   Psychiatric/Behavioral: Negative.   All other systems reviewed and are negative.      Objective:   Physical Exam  Constitutional: She is oriented to person, place, and time. She appears well-developed and well-nourished.  HENT:  Head: Normocephalic and atraumatic.  Neck: Normal range of motion. Neck supple.  Cervical Paraspinal Tenderness: C-5-C-6  Cardiovascular: Normal rate and regular rhythm.  Pulmonary/Chest: Effort normal and breath sounds normal.  Musculoskeletal:  Normal Muscle Bulk and  Muscle Testing Reveals: Upper Extremities: Full ROM and Muscle Strength 4/5 Bilateral AC Joint Tenderness Thoracic and Lumbar Hypersensitivity Bilateral Greater Trochanter Tenderness Lower Extremities: Full ROM and Muscle Strength 5/5 Bilateral Lower Extremities Flexion Produces Pain into Bilateral Patella's Arises from Table Slowly using 4 Prong Cane for support Antalgic Gait  Neurological: She is alert and oriented to person, place, and time.  Skin: Skin is warm and dry.  Psychiatric: She has a normal mood and affect.  Nursing note and vitals reviewed.         Assessment & Plan:  1. Lumbar spinal stenosis with neurogenic claudication. Associated facet arthropathy/ Lumbar Radiculitis: Continue current medication regime with Gabapentinand HEP as tolerated. 08/26/2017 Continue current treatment: MS Contin 15 mg one tablet every 12 hours #60and continueMSIR 15 one tablet twice a day as needed ( 6 am and 1:00 pm)  #60. We will continue the opioid monitoring program, this consists of regular clinic visits, examinations, urine drug screen, pill counts as well as use of [place] Controlled Substance reporting System. 2. Depression: Continue current medication Regime: Cymbalta. 08/26/2017 3. Diabetes mellitus type 2 with polyneuropathy: Continue current medication regime: Gabapentin. 08/26/2017 4. Rheumatoid arthritis and osteoarthritis. Continue current medication Regime.  Voltaren Gel. Rheumatology Following. 08/26/2017 5. Interstitial lung disease: Pulmonology Following.08/26/2017. 6. Bilateral Osteoarthritis Knee's: Continue current medication regimen. Voltaren Gel.08/26/2017 7. Muscle Spasm: Continue current medication regime : Flexeril. 08/26/2017 8. Bilateral Greater Trochanteric Tenderness: Continue current treatment with Ice/Heat Therapy. 08/26/2017. 9. Polyarthralgia: Rheumatology Following: Continue to monitor.08/26/2017 10. Morbid Obesitity: Continue Healthy Diet  Regime and HEP. Continue to monitor.  08/26/2017  20 minutes of face to face patient care time was spent during this visit. All questions were encouraged and answered.   F/U in 1 month

## 2017-09-07 ENCOUNTER — Telehealth: Payer: Self-pay | Admitting: Licensed Clinical Social Worker

## 2017-09-07 NOTE — Telephone Encounter (Signed)
Palliative Care SW phoned pt and made appointment for 2pm on 09/10/17.

## 2017-09-10 ENCOUNTER — Other Ambulatory Visit: Payer: Medicare Other | Admitting: Licensed Clinical Social Worker

## 2017-09-10 DIAGNOSIS — Z515 Encounter for palliative care: Secondary | ICD-10-CM

## 2017-09-10 NOTE — Progress Notes (Signed)
PATIENT NAME: [name] DOB: 18-Nov-1940 MRN: [id number]  PRIMARY CARE PROVIDER: Glendale Chard, MD  RESPONSIBLE PARTY:  Acct ID - Guarantor Home Phone Work Phone Relationship Acct Type  [id number] [name] Self P/F     [address], [place], [place] [address]     PLAN OF CARE and INTERVENTIONS:             1.GOALS OF CARE/ ADVANCE CARE PLANNING:  Patient stated she would like to return to her home and use her walker again.  Her goal is to become more independent again.  She expressed that her arthritis and lung disease symptoms have improved since seeing doctors at [facility].  Patient stated she has a DNR filed away and that her family knows where it is.  SW introduced the MOST form and explained it to her.  She said that her children will probably have different desires if her health were to decline.  She stated she would review it with her family and will inform SW if she would like to proceed.  2.SOCIAL/EMOTIONAL/SPIRITUAL ASSESSMENT/INTERVENTIONS:  Patient's first husband and the father of her children, died eleven years ago.  She has been married to [name] for nine years.  She stated he is older than her.  She also expressed that she has church friends that provide her with support.  Patient has five children who all live locally. 3.PATIENT/CAREGIVER EDUCATION/COPING:  Patient was alert and oriented x4.  Provided education regarding Palliative Care SW role, and patient stated she understood.  Patient copes by expressing her feelings openly.  She stated she had to be strong for her children when their sibling died on 08/31/17.  Patient's daughter died when she was 62 at [facility] from cancer.  Patient was open to discussing her deceased daughter, but stated she could probably benefit from bereavement counseling.  Patient expressed s  ome discomfort when she is getting ready to have a bowel ovement. 4.PERSONAL EMERGENCY PLAN:  Pt currently informs her daughter,  [name], whom she lives with, if she requires medical attention.  Pt currently is responsible for her own medication administration. 5.COMMUNITY RESOURCES COORDINATION/HEALTH CARE NAVIGATION:  Pt was open to SW returning for a visit in June.  She expressed no need for coordination at this time.  6.FINANCIAL/LEGAL CONCERNS/INTERVENTIONS: None requested per patient.     SOCIAL HX:  Social History   Tobacco Use  . Smoking status: Never Smoker  . Smokeless tobacco: Never Used  Substance Use Topics  . Alcohol use: No    CODE STATUS: DNR in home. ADVANCED DIRECTIVES: N MOST FORM COMPLETE:  No HOSPICE EDUCATION PROVIDED:  Not during this visit.  Patient stated she was afraid when the word Hospice was mentioned because it meant end of life to her. PPS:  Pt can ambulate independently in her home with her walker.  Her appetite is normal. Visit duration and charting:  75 minutes. Planned Interventions in the next 2 weeks:  SW to inform Palliative Care RN, Daryl Eastern, of patient's concerns.  SW to discuss bereavement needs with Hospice Bereavement Counselor.  Next SW visit scheduled for June 17th at 2pm per patient request.     Creola Corn [name], LCSW

## 2017-09-16 DIAGNOSIS — M1A00X0 Idiopathic chronic gout, unspecified site, without tophus (tophi): Secondary | ICD-10-CM | POA: Diagnosis not present

## 2017-09-16 DIAGNOSIS — Z79899 Other long term (current) drug therapy: Secondary | ICD-10-CM | POA: Diagnosis not present

## 2017-09-16 DIAGNOSIS — M059 Rheumatoid arthritis with rheumatoid factor, unspecified: Secondary | ICD-10-CM | POA: Diagnosis not present

## 2017-09-21 ENCOUNTER — Encounter: Payer: Self-pay | Admitting: Nephrology

## 2017-09-22 ENCOUNTER — Encounter: Payer: Self-pay | Admitting: Registered Nurse

## 2017-09-22 ENCOUNTER — Other Ambulatory Visit: Payer: Self-pay

## 2017-09-22 ENCOUNTER — Encounter: Payer: Medicare Other | Attending: Physical Medicine & Rehabilitation | Admitting: Registered Nurse

## 2017-09-22 VITALS — BP 120/80 | HR 95 | Ht 64.0 in | Wt 270.0 lb

## 2017-09-22 DIAGNOSIS — E1122 Type 2 diabetes mellitus with diabetic chronic kidney disease: Secondary | ICD-10-CM | POA: Diagnosis not present

## 2017-09-22 DIAGNOSIS — M5416 Radiculopathy, lumbar region: Secondary | ICD-10-CM | POA: Diagnosis not present

## 2017-09-22 DIAGNOSIS — Z5181 Encounter for therapeutic drug level monitoring: Secondary | ICD-10-CM

## 2017-09-22 DIAGNOSIS — E1142 Type 2 diabetes mellitus with diabetic polyneuropathy: Secondary | ICD-10-CM | POA: Insufficient documentation

## 2017-09-22 DIAGNOSIS — M1712 Unilateral primary osteoarthritis, left knee: Secondary | ICD-10-CM

## 2017-09-22 DIAGNOSIS — Z9889 Other specified postprocedural states: Secondary | ICD-10-CM | POA: Diagnosis not present

## 2017-09-22 DIAGNOSIS — M1711 Unilateral primary osteoarthritis, right knee: Secondary | ICD-10-CM

## 2017-09-22 DIAGNOSIS — F329 Major depressive disorder, single episode, unspecified: Secondary | ICD-10-CM | POA: Diagnosis not present

## 2017-09-22 DIAGNOSIS — M7061 Trochanteric bursitis, right hip: Secondary | ICD-10-CM

## 2017-09-22 DIAGNOSIS — M069 Rheumatoid arthritis, unspecified: Secondary | ICD-10-CM | POA: Insufficient documentation

## 2017-09-22 DIAGNOSIS — M48062 Spinal stenosis, lumbar region with neurogenic claudication: Secondary | ICD-10-CM | POA: Insufficient documentation

## 2017-09-22 DIAGNOSIS — M7062 Trochanteric bursitis, left hip: Secondary | ICD-10-CM | POA: Diagnosis not present

## 2017-09-22 DIAGNOSIS — Z90710 Acquired absence of both cervix and uterus: Secondary | ICD-10-CM | POA: Insufficient documentation

## 2017-09-22 DIAGNOSIS — M47816 Spondylosis without myelopathy or radiculopathy, lumbar region: Secondary | ICD-10-CM

## 2017-09-22 DIAGNOSIS — M2550 Pain in unspecified joint: Secondary | ICD-10-CM | POA: Diagnosis not present

## 2017-09-22 DIAGNOSIS — Z79899 Other long term (current) drug therapy: Secondary | ICD-10-CM

## 2017-09-22 DIAGNOSIS — G894 Chronic pain syndrome: Secondary | ICD-10-CM

## 2017-09-22 DIAGNOSIS — M62838 Other muscle spasm: Secondary | ICD-10-CM | POA: Diagnosis not present

## 2017-09-22 DIAGNOSIS — E1140 Type 2 diabetes mellitus with diabetic neuropathy, unspecified: Secondary | ICD-10-CM | POA: Diagnosis not present

## 2017-09-22 DIAGNOSIS — M79672 Pain in left foot: Secondary | ICD-10-CM

## 2017-09-22 MED ORDER — MORPHINE SULFATE ER 15 MG PO TBCR: 15.0000 mg | EXTENDED_RELEASE_TABLET | Freq: Two times a day (BID) | ORAL | 0 refills | Status: DC

## 2017-09-22 MED ORDER — MORPHINE SULFATE 15 MG PO TABS: 15.0000 mg | ORAL_TABLET | Freq: Two times a day (BID) | ORAL | 0 refills | Status: DC | PRN

## 2017-09-22 NOTE — Progress Notes (Signed)
Subjective:    Patient ID: [name], female    DOB: 06-16-1940, 77 y.o.   MRN: [id number]  HPI: [name] is a 77 year old female who returns for follow up appointment for chronic pain and medication refill. She states her pain is located in her neck radiating into her bilateral shoulders, mid-lower back, bilateral hips, bilateral knees and left foot pain. [name] reports when she was visiting with her daughter when she was hospitialized  she was on her feet quite a bit  and has noticed her toes were ecchymotic. This provider assessed ecchymosis was noted and resolving. She has a follow up appointment with her podiatrist she states.  She rates her pain 6. Her current exercise regime is walking.   [name] Morphine Equivalent is 54.00 MME. Last UDS was Performed on 06/03/2017, it was consistent.   [name] daughter passed away and emotional support given.   Pain Inventory Average Pain 6 Pain Right Now 6 My pain is dull  In the last 24 hours, has pain interfered with the following? General activity 7 Relation with others 7 Enjoyment of life 7 What TIME of day is your pain at its worst? morning Sleep (in general) Fair  Pain is worse with: walking, standing and some activites Pain improves with: rest, therapy/exercise and medication Relief from Meds: n/a  Mobility use a walker  Function retired I need assistance with the following:  meal prep, household duties and shopping  Neuro/Psych bladder control problems weakness numbness trouble walking spasms  Prior Studies Any changes since last visit?  no  Physicians involved in your care    Family History  Problem Relation Age of Onset  . Diabetes Mother   . Heart attack Mother   . Hypertension Father   . Lung cancer Father   . Diabetes Sister   . Diabetes Brother   . Hypertension Brother   . Heart disease Brother   . Heart attack Brother   . Kidney cancer Brother   . Uterine cancer  Daughter   . Breast cancer Sister   . Rheum arthritis Maternal Uncle   . Gout Brother   . Kidney failure Brother        x 5   Social History   Socioeconomic History  . Marital status: Married    Spouse name: Not on file  . Number of children: 6  . Years of education: college  . Highest education level: Not on file  Occupational History  . Occupation: retired Ship broker  . Financial resource strain: Not on file  . Food insecurity:    Worry: Not on file    Inability: Not on file  . Transportation needs:    Medical: Not on file    Non-medical: Not on file  Tobacco Use  . Smoking status: Never Smoker  . Smokeless tobacco: Never Used  Substance and Sexual Activity  . Alcohol use: No  . Drug use: No  . Sexual activity: Not Currently  Lifestyle  . Physical activity:    Days per week: Not on file    Minutes per session: Not on file  . Stress: Not on file  Relationships  . Social connections:    Talks on phone: Not on file    Gets together: Not on file    Attends religious service: Not on file    Active member of club or organization: Not on file    Attends meetings of clubs or  organizations: Not on file    Relationship status: Not on file  Other Topics Concern  . Not on file  Social History Narrative   Patient consumes 2-3 cups coffee per day.   Past Surgical History:  Procedure Laterality Date  . ABDOMINAL HYSTERECTOMY    . BRAIN SURGERY     Gamma knife 10/13. Needs repeat spring  '14  . CARDIAC CATHETERIZATION N/A 10/31/2014   Procedure: Right/Left Heart Cath and Coronary Angiography;  Surgeon: [name], MD;  Location: [facility];  Service: Cardiovascular;  Laterality: N/A;  . ESOPHAGOGASTRODUODENOSCOPY (EGD) WITH PROPOFOL N/A 09/14/2014   Procedure: ESOPHAGOGASTRODUODENOSCOPY (EGD) WITH PROPOFOL;  Surgeon: [name], MD;  [facility];  Service: Endoscopy;  Laterality: N/A;  . INCISION AND DRAINAGE HIP Left 01/16/2017    Procedure: IRRIGATION AND DEBRIDEMENT LEFT HIP;  Surgeon: [name], MD;  [facility];  Service: Orthopedics;  Laterality: Left;  . INTRAMEDULLARY (IM) NAIL INTERTROCHANTERIC Left 11/29/2016   Procedure: INTRAMEDULLARY (IM) NAIL INTERTROCHANTRIC;  Surgeon: [name], MD;  Location: [facility];  Service: Orthopedics;  Laterality: Left;  . OVARY SURGERY    . SHOULDER SURGERY Left   . TONSILLECTOMY  age 58  . VIDEO BRONCHOSCOPY Bilateral 05/31/2013   Procedure: VIDEO BRONCHOSCOPY WITHOUT FLUORO;  Surgeon: Brand Males, MD;  Location: [facility];  Service: Cardiopulmonary;  Laterality: Bilateral;  . video bronscoscopy  2000   lung   Past Medical History:  Diagnosis Date  . Acute on chronic diastolic (congestive) heart failure ([place])   . Anemia    iron deficiency anemia - secondary to blood loss ( chronic)   . Arthritis    endstage changes bilateral knees/bilateral ankles.   . Asthma   . Carotid artery occlusion   . Chronic fatigue   . Chronic kidney disease   . Closed left hip fracture ([place])   . Clotting disorder ([place])    pt denies this  . Contusion of left knee    due to fall 1/14.  [name] COPD (chronic obstructive pulmonary disease) (HCC)    pulmonary fibrosis  . Depression, reactive   . Diabetes mellitus    type II   . Diastolic dysfunction   . Difficulty in walking   . Family history of heart disease   . Generalized muscle weakness   . Gout   . High cholesterol   . History of falling   . Hypertension   . Hypothyroidism   . Interstitial lung disease ([place])   . 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    . Morbid obesity ([place])   . Morbid obesity ([place])   . Neuromuscular disorder ([place])    diabetic neuropathy   . Normal coronary arteries    cardiac catheterization performed  10/31/14  . RA (rheumatoid arthritis) ([place])    has been off methotreaxte since 10/13.    [name] Rheumatoid arthritis ([place])   . Shortness of breath   . Spinal stenosis of lumbar region   . Thyroid disease   . Unspecified lack of coordination   . URI (upper respiratory infection)    BP 120/80   Pulse 95   Ht _0  (1.626 m) Comment: pt reported  Wt 270 lb (122.5 kg)   SpO2 91%   BMI 46.35 kg/m   Opioid Risk Score:   Fall Risk Score:  `1  Depression screen PHQ 2/9  Depression  screen [facility] 2/9 09/22/2017 07/28/2017 06/30/2017 06/03/2017 05/07/2017 04/06/2017 03/10/2017  Decreased Interest 0 0 0 0 0 1 0  Down, Depressed, Hopeless 0 0 0 0 0 1 0  PHQ - 2 Score 0 0 0 0 0 2 0  Altered sleeping - - - - - - -  Tired, decreased energy - - - - - - -  Change in appetite - - - - - - -  Feeling bad or failure about yourself  - - - - - - -  Trouble concentrating - - - - - - -  Moving slowly or fidgety/restless - - - - - - -  Suicidal thoughts - - - - - - -  PHQ-9 Score - - - - - - -  Difficult doing work/chores - - - - - - -  Some recent data might be hidden    Review of Systems  Constitutional: Negative.   HENT: Negative.   Eyes: Negative.   Respiratory: Negative.   Cardiovascular: Negative.   Gastrointestinal: Negative.   Endocrine: Negative.   Genitourinary: Negative.   Musculoskeletal: Negative.   Skin: Negative.   Allergic/Immunologic: Negative.   Neurological: Negative.   Hematological: Negative.   Psychiatric/Behavioral: Negative.   All other systems reviewed and are negative.      Objective:   Physical Exam  Constitutional: She is oriented to person, place, and time. She appears well-developed and well-nourished.  HENT:  Head: Normocephalic and atraumatic.  Neck: Normal range of motion. Neck supple.  Cervical Paraspinal Tenderness: C-5-C-6  Cardiovascular: Normal rate and regular rhythm.  Pulmonary/Chest: Effort normal.  Musculoskeletal:  Normal Muscle Bulk and Muscle Testing Reveals: Upper Extremities: Upper Extremities: Full ROM and Muscle Strength  4/5 Bilateral AC Joint Tenderness Thoracic and Lumbar Hypersensitivity Lower Extremities: Full ROM and Muscle Strength 5/5 Wearing Compression Stockings Anasarca noted Arises from Table slowly using cane for support Antalgic gait  Neurological: She is alert and oriented to person, place, and time.  Skin: Skin is warm and dry.  Psychiatric: She has a normal mood and affect.  Nursing note and vitals reviewed.         Assessment & Plan:  1. Lumbar spinal stenosis with neurogenic claudication. Associated facet arthropathy/ Lumbar Radiculitis: Continue current medication regime with Gabapentinand HEP as tolerated. 09/22/2017 Continue current treatment: MS Contin 15 mg one tablet every 12 hours #60and continueMSIR 15 one tablet twice a dayas needed ( 6 am and 1:00 pm)#60. We will continue the opioid monitoring program, this consists of regular clinic visits, examinations, urine drug screen, pill counts as well as use of [place] Controlled Substance reporting System. 2. Depression: Continue current medication Regime: Cymbalta. 09/22/2017 3. Diabetes mellitus type 2 with polyneuropathy: Continue current medication regime: Gabapentin. 09/22/2017 4. Rheumatoid arthritis and osteoarthritis. Continue current medication Regime.  Voltaren Gel. Rheumatology Following. 09/22/2017 5. Interstitial lung disease: Pulmonology Following.09/22/2017. 6. Bilateral Osteoarthritis Knee's: Continue current medication regimen. Voltaren Gel.09/22/2017 7. Muscle Spasm: Continue current medication regime : Flexeril. 09/22/2017 8. Bilateral Greater Trochanteric Tenderness: Continue current treatment with Ice/Heat Therapy. 09/22/2017. 9. Polyarthralgia: Rheumatology Following: Continue to monitor.09/22/2017 10. Morbid Obesitity: Continue Healthy Diet Regime and HEP. Continue to monitor.  09/22/2017  20 minutes of face to face patient care time was spent during this visit. All questions were encouraged  and answered.   F/U in 1 month

## 2017-09-23 ENCOUNTER — Encounter: Payer: Self-pay | Admitting: Nephrology

## 2017-09-23 DIAGNOSIS — M069 Rheumatoid arthritis, unspecified: Secondary | ICD-10-CM | POA: Diagnosis not present

## 2017-09-23 DIAGNOSIS — D329 Benign neoplasm of meninges, unspecified: Secondary | ICD-10-CM | POA: Diagnosis not present

## 2017-09-23 DIAGNOSIS — E1122 Type 2 diabetes mellitus with diabetic chronic kidney disease: Secondary | ICD-10-CM | POA: Diagnosis not present

## 2017-09-23 DIAGNOSIS — N183 Chronic kidney disease, stage 3 (moderate): Secondary | ICD-10-CM | POA: Diagnosis not present

## 2017-09-23 DIAGNOSIS — E785 Hyperlipidemia, unspecified: Secondary | ICD-10-CM | POA: Diagnosis not present

## 2017-09-23 DIAGNOSIS — I129 Hypertensive chronic kidney disease with stage 1 through stage 4 chronic kidney disease, or unspecified chronic kidney disease: Secondary | ICD-10-CM | POA: Diagnosis not present

## 2017-09-23 DIAGNOSIS — E039 Hypothyroidism, unspecified: Secondary | ICD-10-CM | POA: Diagnosis not present

## 2017-09-23 DIAGNOSIS — D631 Anemia in chronic kidney disease: Secondary | ICD-10-CM | POA: Diagnosis not present

## 2017-09-23 DIAGNOSIS — N2581 Secondary hyperparathyroidism of renal origin: Secondary | ICD-10-CM | POA: Diagnosis not present

## 2017-09-30 ENCOUNTER — Encounter (INDEPENDENT_AMBULATORY_CARE_PROVIDER_SITE_OTHER): Payer: Self-pay | Admitting: Orthopaedic Surgery

## 2017-09-30 ENCOUNTER — Ambulatory Visit (INDEPENDENT_AMBULATORY_CARE_PROVIDER_SITE_OTHER): Payer: Medicare Other

## 2017-09-30 ENCOUNTER — Ambulatory Visit (INDEPENDENT_AMBULATORY_CARE_PROVIDER_SITE_OTHER): Payer: Medicare Other | Admitting: Orthopaedic Surgery

## 2017-09-30 DIAGNOSIS — S72142S Displaced intertrochanteric fracture of left femur, sequela: Secondary | ICD-10-CM

## 2017-09-30 DIAGNOSIS — M25562 Pain in left knee: Secondary | ICD-10-CM | POA: Diagnosis not present

## 2017-09-30 DIAGNOSIS — G8929 Other chronic pain: Secondary | ICD-10-CM

## 2017-09-30 MED ORDER — METHYLPREDNISOLONE ACETATE 40 MG/ML IJ SUSP
40.0000 mg | INTRAMUSCULAR | Status: AC | PRN
  Administered 2017-09-30: 40 mg via INTRA_ARTICULAR

## 2017-09-30 MED ORDER — LIDOCAINE HCL 1 % IJ SOLN
3.0000 mL | INTRAMUSCULAR | Status: AC | PRN
Start: 2017-09-30 — End: 2017-09-30
  Administered 2017-09-30: 3 mL

## 2017-09-30 NOTE — Progress Notes (Signed)
Office Visit Note   Patient: [name]           Date of Birth: 1940/10/17           MRN: [id number] Visit Date: 09/30/2017              Requested by: Glendale Chard, [address] [place], [place] [address] PCP: Glendale Chard, MD   Assessment & Plan: Visit Diagnoses:  1. Closed comminuted intertrochanteric fracture of left femur, sequela   2. Chronic pain of left knee     Plan: At this point as far as the hip goes she will follow-up as needed.  As far as her knee, offered her steroid injection.  She understands how this can affect her blood glucose but she has good control now.  She tolerated this well.  She is had one before about 2 years ago and it helped greatly.  All questions concerns were answered and addressed.  If there is any issues at all she will let us know.  Follow-Up Instructions: Return if symptoms worsen or fail to improve.   Orders:  Orders Placed This Encounter  Procedures  . Large Joint Inj  . XR HIP UNILAT W OR W/O PELVIS 1V LEFT   No orders of the defined types were placed in this encounter.     Procedures: Large Joint Inj: L knee on 09/30/2017 1:24 PM Indications: diagnostic evaluation and pain Details: 22 G 1.5 in needle, superolateral approach  Arthrogram: No  Medications: 3 mL lidocaine 1 %; 40 mg methylPREDNISolone acetate 40 MG/ML Outcome: tolerated well, no immediate complications Procedure, treatment alternatives, risks and benefits explained, specific risks discussed. Consent was given by the patient. Immediately prior to procedure a time out was called to verify the correct patient, procedure, equipment, support staff and site/side marked as required. Patient was prepped and draped in the usual sterile fashion.       Clinical Data: No additional findings.   Subjective: Chief Complaint  Patient presents with  . Left Hip - Follow-up  The at this point she will follow-up as needed as far as the hip goes.  For  her knee talked about trying a steroid injection.  Patient is 10 months now status post a reduction and fixation of a complex left intertrochanteric hip fracture.  She is ambulate with a walker but this is a lot to do with previous rheumatologic disease.  She is deconditioned morbidly obese as well.  She has been having left knee pain and known arthritis in the left knee.  She has not been a surgical candidate due to multiple factors related to her obesity and her health.  She says her left hip is actually doing very well.  HPI  Review of Systems She reports good blood glucose control.  Objective: Vital Signs: There were no vitals taken for this visit.  Physical Exam She is alert and oriented x3 in no acute distress Ortho Exam Examination of her left hip shows full range of motion with no pain. Specialty Comments:  No specialty comments available.  Imaging: No results found.   PMFS History: Patient Active Problem List   Diagnosis Date Noted  . Pain in left hip 02/18/2017  . Wound infection after surgery, initial encounter 01/16/2017  . Closed nondisplaced intertrochanteric fracture of left femur with delayed healing 01/14/2017  . Postoperative wound infection 01/14/2017  . Gout 11/29/2016  . Depression 11/29/2016  . Closed left hip fracture ([place]) 11/29/2016  .  GERD (gastroesophageal reflux disease) 11/29/2016  . Closed intertrochanteric fracture of hip, left, initial encounter ([place]) 11/29/2016  . Physical deconditioning 07/31/2015  . Pulmonary fibrosis, postinflammatory ([place]) 07/31/2015  . Tendinitis of left rotator cuff 06/13/2015  . Acute on chronic diastolic heart failure ([place]) 04/21/2015  . Acute pulmonary edema (HCC)   . AKI (acute kidney injury) ([place])   . Peripheral edema   . Acute on chronic respiratory failure with hypoxia ([place]) 04/17/2015  . CAP (community acquired pneumonia) 04/17/2015  . Acute kidney injury superimposed on chronic kidney disease ([place]) 04/17/2015    . Hypotension 04/17/2015  . Chronic respiratory failure with hypoxia ([place]) 02/09/2015  . Dyspnea and respiratory abnormality 01/03/2015  . Normal coronary arteries 11/02/2014  . Morbid obesity ([place]) 10/30/2014  . Dysphagia 08/21/2014  . Sore throat 07/18/2014  . Chronic fatigue 06/21/2014  . DOE (dyspnea on exertion) 05/24/2014  . High risk medication use 04/17/2014  . Aphasia 02/12/2014  . 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 Lumbar facet arthropathy 01/18/2014  . Acute bronchitis 07/24/2013  . Bilateral edema of lower extremity 05/30/2013  . Chronic cough 05/24/2013  . ILD (interstitial lung disease) ([place]) 04/10/2013  . Greater trochanteric bursitis of left hip 08/13/2012  . Spinal stenosis of lumbar region with radiculopathy 07/12/2012  . Inability to walk 07/12/2012  . Acute lumbar back pain 07/12/2012  . Asymptomatic bacteriuria 07/12/2012  . 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 ([place]) 02/17/2012  . Benign neoplasm of meninges ([place]) 02/12/2012  . Brain mass 01/28/2012  . Chest pain with moderate risk of acute coronary syndrome 01/28/2012  . Rheumatoid arthritis ([place]) 01/28/2012  . Primary osteoarthritis of right knee 01/28/2012  . Dyslipidemia   . Thyroid disease   . Essential hypertension    Past Medical History:  Diagnosis Date  . Acute on chronic diastolic (congestive) heart failure ([place])   . Anemia    iron deficiency anemia - secondary to blood loss ( chronic)   . Arthritis    endstage changes bilateral knees/bilateral ankles.   . Asthma   . Carotid artery occlusion   . Chronic fatigue   . Chronic kidney disease   . Closed left hip fracture ([place])   . Clotting disorder ([place])    pt denies this  . Contusion of left knee    due to fall 1/14.  [name] COPD (chronic obstructive pulmonary disease) (HCC)    pulmonary fibrosis  . Depression, reactive   . Diabetes mellitus    type II   . Diastolic dysfunction   .  Difficulty in walking   . Family history of heart disease   . Generalized muscle weakness   . Gout   . High cholesterol   . History of falling   . Hypertension   . Hypothyroidism   . Interstitial lung disease ([place])   . 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    . Morbid obesity ([place])   . Morbid obesity ([place])   . Neuromuscular disorder ([place])    diabetic neuropathy   . Normal coronary arteries    cardiac catheterization performed  10/31/14  . RA (rheumatoid arthritis) ([place])    has been off methotreaxte since 10/13.  [name] Rheumatoid arthritis ([place])   . Shortness of breath   . Spinal stenosis of lumbar region   . Thyroid disease   . Unspecified lack of coordination   .  URI (upper respiratory infection)     Family History  Problem Relation Age of Onset  . Diabetes Mother   . Heart attack Mother   . Hypertension Father   . Lung cancer Father   . Diabetes Sister   . Diabetes Brother   . Hypertension Brother   . Heart disease Brother   . Heart attack Brother   . Kidney cancer Brother   . Uterine cancer Daughter   . Breast cancer Sister   . Rheum arthritis Maternal Uncle   . Gout Brother   . Kidney failure Brother        x 5    Past Surgical History:  Procedure Laterality Date  . ABDOMINAL HYSTERECTOMY    . BRAIN SURGERY     Gamma knife 10/13. Needs repeat spring  '14  . CARDIAC CATHETERIZATION N/A 10/31/2014   Procedure: Right/Left Heart Cath and Coronary Angiography;  Surgeon: [name], MD;  Location: [facility];  Service: Cardiovascular;  Laterality: N/A;  . ESOPHAGOGASTRODUODENOSCOPY (EGD) WITH PROPOFOL N/A 09/14/2014   Procedure: ESOPHAGOGASTRODUODENOSCOPY (EGD) WITH PROPOFOL;  Surgeon: [name], MD;  [facility];  Service: Endoscopy;  Laterality: N/A;  . INCISION AND DRAINAGE HIP Left 01/16/2017   Procedure: IRRIGATION AND DEBRIDEMENT LEFT HIP;   Surgeon: [name], MD;  Location: [facility];  Service: Orthopedics;  Laterality: Left;  . INTRAMEDULLARY (IM) NAIL INTERTROCHANTERIC Left 11/29/2016   Procedure: INTRAMEDULLARY (IM) NAIL INTERTROCHANTRIC;  Surgeon: [name], MD;  Location: [facility];  Service: Orthopedics;  Laterality: Left;  . OVARY SURGERY    . SHOULDER SURGERY Left   . TONSILLECTOMY  age 24  . VIDEO BRONCHOSCOPY Bilateral 05/31/2013   Procedure: VIDEO BRONCHOSCOPY WITHOUT FLUORO;  Surgeon: Brand Males, MD;  Location: [facility];  Service: Cardiopulmonary;  Laterality: Bilateral;  . video bronscoscopy  2000   lung   Social History   Occupational History  . Occupation: retired Marine scientist  Tobacco Use  . Smoking status: Never Smoker  . Smokeless tobacco: Never Used  Substance and Sexual Activity  . Alcohol use: No  . Drug use: No  . Sexual activity: Not Currently

## 2017-10-22 ENCOUNTER — Encounter: Payer: Medicare Other | Attending: Physical Medicine & Rehabilitation | Admitting: Registered Nurse

## 2017-10-22 ENCOUNTER — Encounter: Payer: Self-pay | Admitting: Registered Nurse

## 2017-10-22 ENCOUNTER — Other Ambulatory Visit: Payer: Self-pay

## 2017-10-22 VITALS — BP 138/88 | HR 102 | Ht 64.0 in | Wt 277.4 lb

## 2017-10-22 DIAGNOSIS — M1712 Unilateral primary osteoarthritis, left knee: Secondary | ICD-10-CM

## 2017-10-22 DIAGNOSIS — M47816 Spondylosis without myelopathy or radiculopathy, lumbar region: Secondary | ICD-10-CM

## 2017-10-22 DIAGNOSIS — Z9889 Other specified postprocedural states: Secondary | ICD-10-CM | POA: Insufficient documentation

## 2017-10-22 DIAGNOSIS — F329 Major depressive disorder, single episode, unspecified: Secondary | ICD-10-CM | POA: Insufficient documentation

## 2017-10-22 DIAGNOSIS — E1140 Type 2 diabetes mellitus with diabetic neuropathy, unspecified: Secondary | ICD-10-CM

## 2017-10-22 DIAGNOSIS — E1142 Type 2 diabetes mellitus with diabetic polyneuropathy: Secondary | ICD-10-CM | POA: Insufficient documentation

## 2017-10-22 DIAGNOSIS — E1122 Type 2 diabetes mellitus with diabetic chronic kidney disease: Secondary | ICD-10-CM | POA: Insufficient documentation

## 2017-10-22 DIAGNOSIS — M7062 Trochanteric bursitis, left hip: Secondary | ICD-10-CM

## 2017-10-22 DIAGNOSIS — M7061 Trochanteric bursitis, right hip: Secondary | ICD-10-CM

## 2017-10-22 DIAGNOSIS — M2550 Pain in unspecified joint: Secondary | ICD-10-CM | POA: Diagnosis not present

## 2017-10-22 DIAGNOSIS — M48062 Spinal stenosis, lumbar region with neurogenic claudication: Secondary | ICD-10-CM | POA: Diagnosis not present

## 2017-10-22 DIAGNOSIS — Z5181 Encounter for therapeutic drug level monitoring: Secondary | ICD-10-CM

## 2017-10-22 DIAGNOSIS — Z90710 Acquired absence of both cervix and uterus: Secondary | ICD-10-CM | POA: Insufficient documentation

## 2017-10-22 DIAGNOSIS — Z79899 Other long term (current) drug therapy: Secondary | ICD-10-CM | POA: Diagnosis not present

## 2017-10-22 DIAGNOSIS — M62838 Other muscle spasm: Secondary | ICD-10-CM | POA: Diagnosis not present

## 2017-10-22 DIAGNOSIS — G894 Chronic pain syndrome: Secondary | ICD-10-CM

## 2017-10-22 DIAGNOSIS — M069 Rheumatoid arthritis, unspecified: Secondary | ICD-10-CM | POA: Insufficient documentation

## 2017-10-22 DIAGNOSIS — M1711 Unilateral primary osteoarthritis, right knee: Secondary | ICD-10-CM

## 2017-10-22 MED ORDER — MORPHINE SULFATE 15 MG PO TABS
15.0000 mg | ORAL_TABLET | Freq: Two times a day (BID) | ORAL | 0 refills | Status: DC | PRN
Start: 2017-10-22 — End: 2018-01-11

## 2017-10-22 MED ORDER — MORPHINE SULFATE ER 15 MG PO TBCR
15.0000 mg | EXTENDED_RELEASE_TABLET | Freq: Two times a day (BID) | ORAL | 0 refills | Status: DC
Start: 2017-10-22 — End: 2017-12-14

## 2017-10-22 NOTE — Progress Notes (Signed)
Subjective:    Patient ID: [name], female    DOB: [birth year], 77 y.o.   MRN: [id number]  HPI: [name] is a 77 year old female who returns for follow up appointment for chronic pain and medication refill. She states her pain is located in her neck, left shoulder, upper- lower back radiating into her left lower extremity. She rates her pain 6. Her current exercise regime is walking.   Ms. Byers Morphine Equivalent is 60.00 MME.  Last UDS was Performed on 06/03/2017, it was consistent.   Pain Inventory Average Pain 7 Pain Right Now 6 My pain is sharp and stabbing  In the last 24 hours, has pain interfered with the following? General activity 8 Relation with others 8 Enjoyment of life 8 What TIME of day is your pain at its worst? morning Sleep (in general) NA  Pain is worse with: walking, bending, standing and some activites Pain improves with: rest, heat/ice and medication Relief from Meds: 7  Mobility use a walker  Function retired I need assistance with the following:  meal prep, household duties and shopping  Neuro/Psych bladder control problems tremor trouble walking  Prior Studies Any changes since last visit?  no  Physicians involved in your care Any changes since last visit?  no   Family History  Problem Relation Age of Onset  . Diabetes Mother   . Heart attack Mother   . Hypertension Father   . Lung cancer Father   . Diabetes Sister   . Diabetes Brother   . Hypertension Brother   . Heart disease Brother   . Heart attack Brother   . Kidney cancer Brother   . Uterine cancer Daughter   . Breast cancer Sister   . Rheum arthritis Maternal Uncle   . Gout Brother   . Kidney failure Brother        x 5   Social History   Socioeconomic History  . Marital status: Married    Spouse name: Not on file  . Number of children: 6  . Years of education: college  . Highest education level: Not on file  Occupational History  .  Occupation: retired Ship broker  . Financial resource strain: Not on file  . Food insecurity:    Worry: Not on file    Inability: Not on file  . Transportation needs:    Medical: Not on file    Non-medical: Not on file  Tobacco Use  . Smoking status: Never Smoker  . Smokeless tobacco: Never Used  Substance and Sexual Activity  . Alcohol use: No  . Drug use: No  . Sexual activity: Not Currently  Lifestyle  . Physical activity:    Days per week: Not on file    Minutes per session: Not on file  . Stress: Not on file  Relationships  . Social connections:    Talks on phone: Not on file    Gets together: Not on file    Attends religious service: Not on file    Active member of club or organization: Not on file    Attends meetings of clubs or organizations: Not on file    Relationship status: Not on file  Other Topics Concern  . Not on file  Social History Narrative   Patient consumes 2-3 cups coffee per day.   Past Surgical History:  Procedure Laterality Date  . ABDOMINAL HYSTERECTOMY    . BRAIN SURGERY  Gamma knife 10/13. Needs repeat spring  '14  . CARDIAC CATHETERIZATION N/A 10/31/2014   Procedure: Right/Left Heart Cath and Coronary Angiography;  Surgeon: [name], MD;  Location: [facility];  Service: Cardiovascular;  Laterality: N/A;  . ESOPHAGOGASTRODUODENOSCOPY (EGD) WITH PROPOFOL N/A 09/14/2014   Procedure: ESOPHAGOGASTRODUODENOSCOPY (EGD) WITH PROPOFOL;  Surgeon: [name], MD;  [facility];  Service: Endoscopy;  Laterality: N/A;  . INCISION AND DRAINAGE HIP Left 01/16/2017   Procedure: IRRIGATION AND DEBRIDEMENT LEFT HIP;  Surgeon: [name], MD;  [facility];  Service: Orthopedics;  Laterality: Left;  . INTRAMEDULLARY (IM) NAIL INTERTROCHANTERIC Left 11/29/2016   Procedure: INTRAMEDULLARY (IM) NAIL INTERTROCHANTRIC;  Surgeon: [name], MD;  Location: [facility];  Service: Orthopedics;   Laterality: Left;  . OVARY SURGERY    . SHOULDER SURGERY Left   . TONSILLECTOMY  age 77  . VIDEO BRONCHOSCOPY Bilateral 05/31/2013   Procedure: VIDEO BRONCHOSCOPY WITHOUT FLUORO;  Surgeon: Brand Males, MD;  Location: [facility];  Service: Cardiopulmonary;  Laterality: Bilateral;  . video bronscoscopy  2000   lung   Past Medical History:  Diagnosis Date  . Acute on chronic diastolic (congestive) heart failure ([place])   . Anemia    iron deficiency anemia - secondary to blood loss ( chronic)   . Arthritis    endstage changes bilateral knees/bilateral ankles.   . Asthma   . Carotid artery occlusion   . Chronic fatigue   . Chronic kidney disease   . Closed left hip fracture ([place])   . Clotting disorder ([place])    pt denies this  . Contusion of left knee    due to fall 1/14.  [name] COPD (chronic obstructive pulmonary disease) (HCC)    pulmonary fibrosis  . Depression, reactive   . Diabetes mellitus    type II   . Diastolic dysfunction   . Difficulty in walking   . Family history of heart disease   . Generalized muscle weakness   . Gout   . High cholesterol   . History of falling   . Hypertension   . Hypothyroidism   . Interstitial lung disease ([place])   . 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    . Morbid obesity ([place])   . Morbid obesity ([place])   . Neuromuscular disorder ([place])    diabetic neuropathy   . Normal coronary arteries    cardiac catheterization performed  10/31/14  . RA (rheumatoid arthritis) ([place])    has been off methotreaxte since 10/13.  [name] Rheumatoid arthritis ([place])   . Shortness of breath   . Spinal stenosis of lumbar region   . Thyroid disease   . Unspecified lack of coordination   . URI (upper respiratory infection)    BP 138/88   Pulse (!) 102   Ht _0  (1.626 m)   Wt 277 lb 6.4 oz (125.8 kg)   SpO2 94%   BMI 47.62 kg/m   Opioid Risk Score:   Fall Risk  Score:  `1  Depression screen PHQ 2/9  Depression screen [facility] 2/9 10/22/2017 09/22/2017 07/28/2017 06/30/2017 06/03/2017 05/07/2017 04/06/2017  Decreased Interest 0 0 0 0 0 0 1  Down, Depressed, Hopeless 0 0 0 0 0 0 1  PHQ - 2 Score 0 0 0 0 0 0 2  Altered sleeping - - - - - - -  Tired, decreased energy - - - - - - -  Change in appetite - - - - - - -  Feeling bad or failure about yourself  - - - - - - -  Trouble concentrating - - - - - - -  Moving slowly or fidgety/restless - - - - - - -  Suicidal thoughts - - - - - - -  PHQ-9 Score - - - - - - -  Difficult doing work/chores - - - - - - -  Some recent data might be hidden   Review of Systems  Constitutional: Positive for diaphoresis.  HENT: Negative.   Respiratory: Negative.   Cardiovascular: Negative.   Gastrointestinal: Negative.   Endocrine: Negative.   Genitourinary:       Bladder control  Musculoskeletal: Positive for gait problem.  Skin: Negative.   Allergic/Immunologic: Negative.   Neurological: Positive for numbness.  Hematological: Negative.   Psychiatric/Behavioral: Negative.   All other systems reviewed and are negative.      Objective:   Physical Exam  Constitutional: She is oriented to person, place, and time. She appears well-developed and well-nourished.  HENT:  Head: Normocephalic and atraumatic.  Neck: Normal range of motion. Neck supple.  Cervical Paraspinal Tenderness: C-5-C-6  Cardiovascular: Normal rate and regular rhythm.  Pulmonary/Chest: Effort normal and breath sounds normal.  Musculoskeletal:  Normal Muscle Bulk and Muscle Testing Reveals: Upper Extremities: Full ROM and Muscle Strength 5/5 Thoracic and Lumbar Hypersensitivity Bilateral Greater Trochanter Tenderness Lower Extremities: Full ROM and Muscle Strength 5/5 Anasarca to lower extremities Arises from Table slowly using walker for support Antalgic Gait  Neurological: She is alert and oriented to person, place, and time.  Skin: Skin is  warm and dry.  Psychiatric: She has a normal mood and affect.  Nursing note and vitals reviewed.         Assessment & Plan:  1. Lumbar spinal stenosis with neurogenic claudication. Associated facet arthropathy/ Lumbar Radiculitis: Continuecurrent medication regime withGabapentinand HEP as tolerated. 10/23/2017 Continuecurrent treatment: MS Contin 15 mg one tablet every 12 hours #60and continueMSIR 15 one tablet twice a dayas needed ( 6 am and 1:00 pm)#60. We will continue the opioid monitoring program, this consists of regular clinic visits, examinations, urine drug screen, pill counts as well as use of [place] Controlled Substance reporting System. 2. Depression: Continuecurrent medication Regime:Cymbalta. 10/23/2017 3. Diabetes mellitus type 2 with polyneuropathy: Continuecurrent medication regime:Gabapentin. 10/23/2017 4. Rheumatoid arthritis and osteoarthritis. Continuecurrent medication Regime.Voltaren Gel. Rheumatology Following. 10/23/2017 5. Interstitial lung disease: Pulmonology Following.10/23/2017. 6. Bilateral Osteoarthritis Knee's: Continuecurrent medication regimen.Voltaren Gel. 10/23/2017 7. Muscle Spasm: Continuecurrent medication regime: Flexeril. 10/23/2017 8.BilateralGreater Trochanteric Tenderness: Continuecurrent treatmentwith Ice/Heat Therapy. 10/23/2017. 9. Polyarthralgia: Rheumatology Following: Continue to monitor.10/23/2017 10. Morbid Obesitity: Continue Healthy Diet Regime and HEP. Continue to monitor.10/23/2017 11. Bilateral Greater Trochanter Bursitis: Continue to Alternating Ice/ Heat Therapy: Continue to Monitor. 10/23/2017.  20 minutes of face to face patient care time was spent during this visit. All questions were encouraged and answered.   F/U in 1 month

## 2017-10-23 ENCOUNTER — Ambulatory Visit: Payer: Medicare Other | Admitting: Podiatry

## 2017-10-23 DIAGNOSIS — E039 Hypothyroidism, unspecified: Secondary | ICD-10-CM | POA: Diagnosis not present

## 2017-10-23 DIAGNOSIS — E1122 Type 2 diabetes mellitus with diabetic chronic kidney disease: Secondary | ICD-10-CM | POA: Diagnosis not present

## 2017-10-23 DIAGNOSIS — I129 Hypertensive chronic kidney disease with stage 1 through stage 4 chronic kidney disease, or unspecified chronic kidney disease: Secondary | ICD-10-CM | POA: Diagnosis not present

## 2017-10-23 DIAGNOSIS — N08 Glomerular disorders in diseases classified elsewhere: Secondary | ICD-10-CM | POA: Diagnosis not present

## 2017-10-23 DIAGNOSIS — J849 Interstitial pulmonary disease, unspecified: Secondary | ICD-10-CM | POA: Diagnosis not present

## 2017-11-02 ENCOUNTER — Other Ambulatory Visit: Payer: Medicare Other | Admitting: Licensed Clinical Social Worker

## 2017-11-02 ENCOUNTER — Other Ambulatory Visit: Payer: Medicare Other | Admitting: *Deleted

## 2017-11-02 DIAGNOSIS — Z515 Encounter for palliative care: Secondary | ICD-10-CM

## 2017-11-02 NOTE — Progress Notes (Signed)
COMMUNITY PALLIATIVE CARE SW NOTE  PATIENT NAME: [name] DOB: Jul 27, 1940 MRN: [id number]  PRIMARY CARE PROVIDER: Glendale Chard, MD  RESPONSIBLE PARTY:  Acct ID - Guarantor Home Phone Work Phone Relationship Acct Type  [id number] [name] Self P/F     [place], [place], [place] [address]     PLAN OF CARE and INTERVENTIONS:             1. GOALS OF CARE/ ADVANCE CARE PLANNING:  Patient has returned to her home that she shares with her husband.  Patient is a DNR. 2. SOCIAL/EMOTIONAL/SPIRITUAL ASSESSMENT/ INTERVENTIONS:  SW and Palliative Care RN, Daryl Eastern, met with patient at her home.  She moved from her daughter's home two weeks ago back to the home she shares with her current husband.  Patient denied pain during visit.  SW provided active listening while she discussed the death of her 12 year old daughter back in April.  She expressed the difficulties she is facing with the death of a child, providing support to her remaining children, and the financial issues of her deceased daughter.  Patient is delegating activities to her children and grandchildren to assist in her own wellbeing.  She expressed feeling more positive at the end of the visit. 3. PATIENT/CAREGIVER EDUCATION/ COPING:  Patient copes by expressing her feelings openly to SW and RN.  She stated she needs to appear emotionally strong for her children.  Provided additional education to patient regarding Palliative Care.  Patient stated she understood. 4. PERSONAL EMERGENCY PLAN:  Patient contacts one of her children or husband for emergency needs.  She is aware of her abilities and will rest when needed. 5. COMMUNITY RESOURCES COORDINATION/ HEALTH CARE NAVIGATION:  None per patient. 6. FINANCIAL/LEGAL CONCERNS/INTERVENTIONS:  None per patient.     SOCIAL HX:  Social History   Tobacco Use  . Smoking status: Never Smoker  . Smokeless tobacco: Never Used  Substance Use Topics  .  Alcohol use: No    CODE STATUS:  DNR  ADVANCED DIRECTIVES: N MOST FORM COMPLETE:  N HOSPICE EDUCATION PROVIDED:  Patient is familiar with Hospice.  PPS:  Patient's intake is normal.  She is able to stand and ambulate independently. Duration of visit and documentation:  140 minutes.      Creola Corn Jayden Kratochvil, LCSW

## 2017-11-03 NOTE — Progress Notes (Signed)
COMMUNITY PALLIATIVE CARE RN NOTE  PATIENT NAME: [name] DOB: 1940-06-28 MRN: [id number]  PRIMARY CARE PROVIDER: Glendale Chard, MD  RESPONSIBLE PARTY:  Acct ID - Guarantor Home Phone Work Phone Relationship Acct Type  [id number] [name] Self P/F     [address], [place], [place] [address]   PLAN OF CARE and INTERVENTION:  1. ADVANCE CARE PLANNING/GOALS OF CARE: Patient would like to start swimming again: Patient at times feels that she needs to be in Assisted Living, however family would like patient to remain at home for as long as they can care for her themselves 2. PATIENT/CAREGIVER EDUCATION: Explained Palliative Care Services, Reinforced Safety/Fall Precautions and Pain Management 3. DISEASE STATUS: Joint visit made with [place]. Patient sitting at dining room table awake and alert. Very pleasant and engaging. Discussed her sadness in regards to the recent death of one of her daughters. Also voiced her sadness about not being able to cook and take care of things herself along with lacking the ability to move around as freely due to increased weakness and fatigue. Children help with washing clothes and household chores. Patient is able to  ambulate using her walker. Denies dizziness since changes in BP medication (Metoprolol cut in half to 50 mg daliy). Reports tiring very easily and requiring frequent rest periods especially after bathing and dressing. Has shower bench with hand-held shower nozzle and bedside commode. States that she is up several times at night using the bathroom. Currently on Lasix 29m daily. Still travels outside of the home, but not as often. No recent falls since hip fracture. C/O increased leg pain and edema bilaterally, with right leg being more painful than the left. States swelling has actually gone down some today since she has been trying to keep them elevated. Intermittent urinary incontinence. Wears  Depends and Poise Pads. Reports intake is slightly better but she does experience early satiety. Blood sugars stable. Kidney function improved but liver function slightly elevated per patient report. Had visit with PCP last week.  Wears Oxygen at 2L/min via [place] during the night and PRN during the day. Reports that she often wakes up during the night with excessive sweating in her scalp. Agreeable to Palliative Team visiting her on a monthly basis.    HISTORY OF PRESENT ILLNESS: This is a 77yo female who lives at home with her husband, with children assisting with care needs. Continues to be followed by Palliative Care. Next visit scheduled in 1 month.    CODE STATUS: DNR  ADVANCED DIRECTIVES: Y MOST FORM: yes PPS: 50%   PHYSICAL EXAM:   VITALS: Today's Vitals   11/02/17 1312  BP: 116/72  Pulse: 86  Resp: 18  SpO2: 96%  Weight: 265 lb (120.2 kg)  Height: _0  (1.626 m)  PainSc: 4   PainLoc: Leg    LUNGS: decreased breath sounds CARDIAC: Cor RRR EXTREMITIES: non-pitting edema bilateral lower extremities SKIN: Exposed skin dry and intact, would healed to L lower leg but skin is thin in that area, discoloration in bilateral lower legs, bruises easily  NEURO: Alert and oriented x 3, forgetful, pleasant mood, ambulates with walker   (Duration of visit and documentation 2 hours 30 minutes)    MDaryl Eastern RN, BSN

## 2017-12-07 ENCOUNTER — Other Ambulatory Visit: Payer: Medicare Other | Admitting: *Deleted

## 2017-12-07 ENCOUNTER — Other Ambulatory Visit: Payer: Medicare Other | Admitting: Licensed Clinical Social Worker

## 2017-12-07 DIAGNOSIS — Z515 Encounter for palliative care: Secondary | ICD-10-CM

## 2017-12-08 NOTE — Progress Notes (Signed)
COMMUNITY PALLIATIVE CARE RN NOTE  PATIENT NAME: [name] DOB: May 10, 1941 MRN: [id number]  PRIMARY CARE PROVIDER: Glendale Chard, MD  RESPONSIBLE PARTY:  Acct ID - Guarantor Home Phone Work Phone Relationship Acct Type  [id number] [name] Self P/F     [address], [place], [place] [address]    PLAN OF CARE and INTERVENTION:  1. ADVANCE CARE PLANNING/GOALS OF CARE: Remain at home and avoid hospitalizations 2. PATIENT/CAREGIVER EDUCATION: Reinforced Safety/Fall Precautions, Energy Conservation, Pain Management 3. DISEASE STATUS: Joint visit made with [place]. Observed patient ambulating with her walker to the kitchen. Patient able, but having more issues and moving at a slower pace. Patient reports pain in her L hip/leg area but is unsure of cause. She states that about a week ago, she was leaning over while sitting down to get something and felt that she may have bent too far. Since then, her leg has been having increased spasms and patient also has a bruise in the area of pain. Patient has contacted her Ortho MD who has her scheduled for a visit on Monday 12/14/17 at 4p. Patient has been taking her regular MS Contin BID and has been also using her PRN Morphine 7m tabs along with Flexeril 562mand feels that it does take the edge off of the pain some, but pain is still pretty severe. Patient has also bought some OTC pain patches to try and dull the pain. Patient also has a rod in that area from her L hip to her knee placed about a year ago. Patient remains able to shower and dress herself, but is having more difficulty d/t pain. Patient still travels outside of her home (husband drives) to the grocery store and has also started attending [employer]. Intake is fair. Patient however states that she is not eating as much at each meal as she used to. Orders food often since her and her husband are unable to cook. Patient reports early  satiety. Also starting to have increased difficulty swallowing things such as cut up apples, nuts and coconut, as they get stuck in her throat. Takes medications whole in liquids, but on occasion has to take in applesauce or yogurt. Has issues with constipation since increased use of pain medication. LBM yesterday. Taking stool softeners to help. Dyspnea noted with exertion. Wears Oxygen at 2L/min via [place] throughout the night. At times requires Oxygen during the day. [facility]14[facility]17 for breathing tests at 10a.Patient admits to not taking her Lasix 80 mg yesterday since she traveled outside of her home, but notices that her weight is up 6 lbs from yesterday. Current wt 274 lbs. Patient did take her Lasix today and says that it is very effective. Intermittent bladder incontinence. Has hired caregiver coming in twice weekly to help with household chores.   HISTORY OF PRESENT ILLNESS:  This is a [age]o female who resides in her home with her husband. Palliative Care continues to follow patient to assess overall condition and assist with symptom management. Next visit scheduled in 2 weeks.  CODE STATUS: DNR ADVANCED DIRECTIVES: Y MOST FORM: yes PPS: 50%   PHYSICAL EXAM:   VITALS: Today's Vitals   12/07/17 1318  BP: 118/62  Pulse: 93  Resp: 18  SpO2: 93%  Weight: 274 lb (124.3 kg)  Height: _0  (1.626 m)  PainSc: 9   PainLoc: Hip    LUNGS: clear to auscultation  CARDIAC: Cor RRR  EXTREMITIES: 2+ and pitting edema bilateral lower extremities SKIN: Exposed skin dry and intact, Bruise noted to L hip and L upper leg region, bruises easily  NEURO: Alert and oriented x 3, pleasant mood, generalized weakness, ambulatory with walker   (Duration of visit and documentation 90 minutes)    Daryl Eastern, RN, BSN

## 2017-12-08 NOTE — Progress Notes (Signed)
COMMUNITY PALLIATIVE CARE SW NOTE  PATIENT NAME: [name] DOB: March 14, 1941 MRN: [id number]  PRIMARY CARE PROVIDER: Glendale Chard, MD  RESPONSIBLE PARTY:  Acct ID - Guarantor Home Phone Work Phone Relationship Acct Type  [id number] [name] Self P/F     [place], [name], [name]     PLAN OF CARE and INTERVENTIONS:             1. GOALS OF CARE/ ADVANCE CARE PLANNING:  Patient wants to remain in her home with her husband, [name].  She has a DNR. 2. SOCIAL/EMOTIONAL/SPIRITUAL ASSESSMENT/ INTERVENTIONS:  SW and Palliative Care RN, [name], met with patient.  Her daughter was present for a portion of the visit.  Patient complained of pain from her left hip to her knee, where she had a ros placed.  She has made an appointment with her MD.  She now has assistance twice a week with household chores.  Daughter stated patient falls asleep more often during the day and that it is concerning her.  She feels patient should wear her O2 more during the day.  The team encouraged patient to continue communicating with her doctors regarding her concerns. 3. PATIENT/CAREGIVER EDUCATION/ COPING:  SW provided education regarding the Palliative Care program.  She expresses her feelings openly.   4. PERSONAL EMERGENCY PLAN:  She contacts her children or husband with initial medical concerns. 5. COMMUNITY RESOURCES COORDINATION/ HEALTH CARE NAVIGATION:  None per patient.  SW made referral for Bereavement Counseling per her request. 6. FINANCIAL/LEGAL CONCERNS/INTERVENTIONS:  None per patient.     SOCIAL HX:  Social History   Tobacco Use  . Smoking status: Never Smoker  . Smokeless tobacco: Never Used  Substance Use Topics  . Alcohol use: No    CODE STATUS: DNR  ADVANCED DIRECTIVES: N MOST FORM COMPLETE:  N HOSPICE EDUCATION PROVIDED: N  PPS:  Patient reports her intake is normal.  She is able to ambulate and stand with a walker.  She reported self  care is more difficult. Duration of visit and documentation:  75 minutes.      Joan Corn Hulan Szumski, LCSW

## 2017-12-09 ENCOUNTER — Encounter: Payer: Self-pay | Admitting: Registered Nurse

## 2017-12-09 ENCOUNTER — Other Ambulatory Visit: Payer: Self-pay

## 2017-12-09 ENCOUNTER — Encounter: Payer: Medicare Other | Attending: Physical Medicine & Rehabilitation | Admitting: Registered Nurse

## 2017-12-09 VITALS — BP 92/61 | HR 103 | Ht 64.0 in | Wt 270.0 lb

## 2017-12-09 DIAGNOSIS — Z5181 Encounter for therapeutic drug level monitoring: Secondary | ICD-10-CM

## 2017-12-09 DIAGNOSIS — M069 Rheumatoid arthritis, unspecified: Secondary | ICD-10-CM | POA: Insufficient documentation

## 2017-12-09 DIAGNOSIS — M62838 Other muscle spasm: Secondary | ICD-10-CM

## 2017-12-09 DIAGNOSIS — M47816 Spondylosis without myelopathy or radiculopathy, lumbar region: Secondary | ICD-10-CM

## 2017-12-09 DIAGNOSIS — F329 Major depressive disorder, single episode, unspecified: Secondary | ICD-10-CM | POA: Insufficient documentation

## 2017-12-09 DIAGNOSIS — Z79891 Long term (current) use of opiate analgesic: Secondary | ICD-10-CM

## 2017-12-09 DIAGNOSIS — G894 Chronic pain syndrome: Secondary | ICD-10-CM | POA: Diagnosis not present

## 2017-12-09 DIAGNOSIS — R0902 Hypoxemia: Secondary | ICD-10-CM

## 2017-12-09 DIAGNOSIS — M7062 Trochanteric bursitis, left hip: Secondary | ICD-10-CM | POA: Diagnosis not present

## 2017-12-09 DIAGNOSIS — Z9889 Other specified postprocedural states: Secondary | ICD-10-CM | POA: Insufficient documentation

## 2017-12-09 DIAGNOSIS — E1122 Type 2 diabetes mellitus with diabetic chronic kidney disease: Secondary | ICD-10-CM | POA: Diagnosis not present

## 2017-12-09 DIAGNOSIS — Z90710 Acquired absence of both cervix and uterus: Secondary | ICD-10-CM | POA: Diagnosis not present

## 2017-12-09 DIAGNOSIS — M2550 Pain in unspecified joint: Secondary | ICD-10-CM

## 2017-12-09 DIAGNOSIS — M1712 Unilateral primary osteoarthritis, left knee: Secondary | ICD-10-CM | POA: Diagnosis not present

## 2017-12-09 DIAGNOSIS — M48062 Spinal stenosis, lumbar region with neurogenic claudication: Secondary | ICD-10-CM | POA: Diagnosis not present

## 2017-12-09 DIAGNOSIS — E1142 Type 2 diabetes mellitus with diabetic polyneuropathy: Secondary | ICD-10-CM | POA: Diagnosis not present

## 2017-12-09 DIAGNOSIS — M5416 Radiculopathy, lumbar region: Secondary | ICD-10-CM

## 2017-12-09 DIAGNOSIS — E1140 Type 2 diabetes mellitus with diabetic neuropathy, unspecified: Secondary | ICD-10-CM | POA: Diagnosis not present

## 2017-12-09 DIAGNOSIS — M1711 Unilateral primary osteoarthritis, right knee: Secondary | ICD-10-CM | POA: Diagnosis not present

## 2017-12-09 DIAGNOSIS — M7061 Trochanteric bursitis, right hip: Secondary | ICD-10-CM

## 2017-12-09 NOTE — Progress Notes (Signed)
Subjective:    Patient ID: [name], female    DOB: 17-Mar-1941, 77 y.o.   MRN: [id number]  HPI: [name] is a 77 year old female who returns for follow up appointment for chronic pain and medication refill. She states her pain is located in her bilateral shoulders L>R, lower back radiating into her left hip and left lower extremity. Also reports she's having increase intensity of hip pain and has an appointment with her orthopedist, she reports. She rates her pain 6. Her current exercise regime is attending [employer] 2-3 days a week for water aerobics and walking.   [name] arrived with oxygen desaturation, she's not wearing her oxygen. Oxygen saturation checked again 90%, educated on wearing her oxygen ar ordered, she verbalizes understanding.   [name] Morphine Equivalent is 60.00 MME. Last UDS was Performed on 06/17/2017, it was consistent. UDS was Ordered today.   Pain Inventory Average Pain 7 Pain Right Now 6 My pain is intermittent, sharp, burning, tingling and aching  In the last 24 hours, has pain interfered with the following? General activity 8 Relation with others 8 Enjoyment of life 8 What TIME of day is your pain at its worst? morning Sleep (in general) Fair  Pain is worse with: walking, bending and some activites Pain improves with: rest and therapy/exercise Relief from Meds: 6  Mobility use a cane use a walker ability to climb steps?  no do you drive?  no  Function retired I need assistance with the following:  meal prep, household duties and shopping  Neuro/Psych bladder control problems weakness numbness tingling trouble walking spasms  Prior Studies Any changes since last visit?  no  Physicians involved in your care Any changes since last visit?  no   Family History  Problem Relation Age of Onset  . Diabetes Mother   . Heart attack Mother   . Hypertension Father   . Lung cancer Father   . Diabetes Sister   . Diabetes  Brother   . Hypertension Brother   . Heart disease Brother   . Heart attack Brother   . Kidney cancer Brother   . Uterine cancer Daughter   . Breast cancer Sister   . Rheum arthritis Maternal Uncle   . Gout Brother   . Kidney failure Brother        x 5   Social History   Socioeconomic History  . Marital status: Married    Spouse name: Not on file  . Number of children: 6  . Years of education: college  . Highest education level: Not on file  Occupational History  . Occupation: retired Ship broker  . Financial resource strain: Not on file  . Food insecurity:    Worry: Not on file    Inability: Not on file  . Transportation needs:    Medical: Not on file    Non-medical: Not on file  Tobacco Use  . Smoking status: Never Smoker  . Smokeless tobacco: Never Used  Substance and Sexual Activity  . Alcohol use: No  . Drug use: No  . Sexual activity: Not Currently  Lifestyle  . Physical activity:    Days per week: Not on file    Minutes per session: Not on file  . Stress: Not on file  Relationships  . Social connections:    Talks on phone: Not on file    Gets together: Not on file    Attends religious service: Not on  file    Active member of club or organization: Not on file    Attends meetings of clubs or organizations: Not on file    Relationship status: Not on file  Other Topics Concern  . Not on file  Social History Narrative   Patient consumes 2-3 cups coffee per day.   Past Surgical History:  Procedure Laterality Date  . ABDOMINAL HYSTERECTOMY    . BRAIN SURGERY     Gamma knife 10/13. Needs repeat spring  '14  . CARDIAC CATHETERIZATION N/A 10/31/2014   Procedure: Right/Left Heart Cath and Coronary Angiography;  Surgeon: [name], MD;  Location: [facility];  Service: Cardiovascular;  Laterality: N/A;  . ESOPHAGOGASTRODUODENOSCOPY (EGD) WITH PROPOFOL N/A 09/14/2014   Procedure: ESOPHAGOGASTRODUODENOSCOPY (EGD) WITH PROPOFOL;  Surgeon:  [name], MD;  Location: [facility];  Service: Endoscopy;  Laterality: N/A;  . INCISION AND DRAINAGE HIP Left 01/16/2017   Procedure: IRRIGATION AND DEBRIDEMENT LEFT HIP;  Surgeon: [name], MD;  Location: [facility];  Service: Orthopedics;  Laterality: Left;  . INTRAMEDULLARY (IM) NAIL INTERTROCHANTERIC Left 11/29/2016   Procedure: INTRAMEDULLARY (IM) NAIL INTERTROCHANTRIC;  Surgeon: [name], MD;  Location: [facility];  Service: Orthopedics;  Laterality: Left;  . OVARY SURGERY    . SHOULDER SURGERY Left   . TONSILLECTOMY  age 23  . VIDEO BRONCHOSCOPY Bilateral 05/31/2013   Procedure: VIDEO BRONCHOSCOPY WITHOUT FLUORO;  Surgeon: Brand Males, MD;  Location: [facility];  Service: Cardiopulmonary;  Laterality: Bilateral;  . video bronscoscopy  2000   lung   Past Medical History:  Diagnosis Date  . Acute on chronic diastolic (congestive) heart failure ([facility])   . Anemia    iron deficiency anemia - secondary to blood loss ( chronic)   . Arthritis    endstage changes bilateral knees/bilateral ankles.   . Asthma   . Carotid artery occlusion   . Chronic fatigue   . Chronic kidney disease   . Closed left hip fracture ([facility])   . Clotting disorder ([facility])    pt denies this  . Contusion of left knee    due to fall 1/14.  [name] COPD (chronic obstructive pulmonary disease) ([facility])    pulmonary fibrosis  . Depression, reactive   . Diabetes mellitus    type II   . Diastolic dysfunction   . Difficulty in walking   . Family history of heart disease   . Generalized muscle weakness   . Gout   . High cholesterol   . History of falling   . Hypertension   . Hypothyroidism   . Interstitial lung disease ([facility])   . 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    . Morbid obesity ([facility])   . Morbid obesity ([place])   . Neuromuscular disorder ([facility])    diabetic neuropathy   . Normal coronary  arteries    cardiac catheterization performed  10/31/14  . RA (rheumatoid arthritis) ([facility])    has been off methotreaxte since 10/13.  [name] Rheumatoid arthritis ([facility])   . Shortness of breath   . Spinal stenosis of lumbar region   . Thyroid disease   . Unspecified lack of coordination   . URI (upper respiratory infection)    BP (!) 69/37   Pulse (!) 107   Ht _0  (1.626 m)   Wt 270 lb (122.5 kg)   SpO2 (!) 85%   BMI  46.35 kg/m   Opioid Risk Score:   Fall Risk Score:  `1  Depression screen PHQ 2/9  Depression screen [facility] 2/9 12/09/2017 10/22/2017 09/22/2017 07/28/2017 06/30/2017 06/03/2017 05/07/2017  Decreased Interest 1 0 0 0 0 0 0  Down, Depressed, Hopeless 1 0 0 0 0 0 0  PHQ - 2 Score 2 0 0 0 0 0 0  Altered sleeping - - - - - - -  Tired, decreased energy - - - - - - -  Change in appetite - - - - - - -  Feeling bad or failure about yourself  - - - - - - -  Trouble concentrating - - - - - - -  Moving slowly or fidgety/restless - - - - - - -  Suicidal thoughts - - - - - - -  PHQ-9 Score - - - - - - -  Difficult doing work/chores - - - - - - -  Some recent data might be hidden    Review of Systems  Constitutional: Positive for fever.       Night sweats  HENT: Negative.   Eyes: Negative.   Respiratory: Positive for shortness of breath and wheezing.   Cardiovascular: Negative.   Gastrointestinal: Positive for constipation.  Endocrine: Negative.   Genitourinary: Negative.   Musculoskeletal: Negative.   Skin: Negative.   Allergic/Immunologic: Negative.   Neurological: Negative.   Hematological: Negative.   Psychiatric/Behavioral: Negative.   All other systems reviewed and are negative.      Objective:   Physical Exam  Constitutional: She appears well-developed and well-nourished.  HENT:  Head: Normocephalic and atraumatic.  Neck: Normal range of motion. Neck supple.  Cardiovascular: Normal rate and regular rhythm.  Pulmonary/Chest: Effort normal and breath sounds  normal.  Musculoskeletal:  Normal Muscle Bulk and Muscle Testing Reveals: Upper Extremities: Full ROM and Muscle Strength 5/5 Bilateral AC Joint Tenderness Thoracic and Lumbar Hypersensitivity Bilateral Greater Trochanter Tenderness Lower Extremities: Decreased ROM and Muscle Strength 5/5 Bilateral Lower Extremities Flexion Produces Pain into her Bilateral Lower Extremities and Bilateral Knee Arises from chair slowly using walker for support Antalgic gait    Skin: Skin is warm and dry.  Psychiatric: She has a normal mood and affect. Her behavior is normal.  Nursing note and vitals reviewed.         Assessment & Plan:  1. Lumbar spinal stenosis with neurogenic claudication. Associated facet arthropathy/ Lumbar Radiculitis: Continuecurrent medication regime withGabapentinand HEP as tolerated. 12/09/2017 Continuecurrent treatment: MS Contin 15 mg one tablet every 12 hours #60and continueMSIR 15 one tablet twice a dayas needed ( 6 am and 1:00 pm)#60. No script given: [name] forgot her medication bottles, she states she has medication. She will call office with the exact count.  We will continue the opioid monitoring program, this consists of regular clinic visits, examinations, urine drug screen, pill counts as well as use of [place] Controlled Substance reporting System. 2. Depression: Continuecurrent medication Regime:Cymbalta. 12/10/2017 3. Diabetes mellitus type 2 with polyneuropathy: Continuecurrent medication regime:Gabapentin. 12/10/2017 4. Rheumatoid arthritis and osteoarthritis. Continuecurrent medication Regime.Voltaren Gel. Rheumatology Following. 12/10/2017 5. Interstitial lung disease: Pulmonology Following.12/10/2017. 6. Bilateral Osteoarthritis Knee's: Continuecurrent medication regimen.Voltaren Gel. 12/10/2017 7. Muscle Spasm: Continuecurrent medication regime: Flexeril. 12/10/2017 8.BilateralGreater Trochanteric Tenderness:  Continuecurrent treatmentwith Ice/Heat Therapy. 12/10/2017. 9. Polyarthralgia: Rheumatology Following: Continue to monitor.12/10/2017 10. Morbid Obesitity: Continue Healthy Diet Regime and HEP. Continue to monitor.12/10/2017  20 minutes of face to face patient care time was spent during this  visit. All questions were encouraged and answered.   F/U in 1 month

## 2017-12-14 ENCOUNTER — Ambulatory Visit (INDEPENDENT_AMBULATORY_CARE_PROVIDER_SITE_OTHER): Payer: Medicare Other | Admitting: Orthopaedic Surgery

## 2017-12-14 ENCOUNTER — Telehealth: Payer: Self-pay | Admitting: *Deleted

## 2017-12-14 ENCOUNTER — Encounter (INDEPENDENT_AMBULATORY_CARE_PROVIDER_SITE_OTHER): Payer: Self-pay | Admitting: Orthopaedic Surgery

## 2017-12-14 DIAGNOSIS — Z6841 Body Mass Index (BMI) 40.0 and over, adult: Secondary | ICD-10-CM | POA: Diagnosis not present

## 2017-12-14 DIAGNOSIS — R29898 Other symptoms and signs involving the musculoskeletal system: Secondary | ICD-10-CM | POA: Diagnosis not present

## 2017-12-14 DIAGNOSIS — S76302A Unspecified injury of muscle, fascia and tendon of the posterior muscle group at thigh level, left thigh, initial encounter: Secondary | ICD-10-CM | POA: Diagnosis not present

## 2017-12-14 DIAGNOSIS — J479 Bronchiectasis, uncomplicated: Secondary | ICD-10-CM | POA: Diagnosis not present

## 2017-12-14 DIAGNOSIS — M47816 Spondylosis without myelopathy or radiculopathy, lumbar region: Secondary | ICD-10-CM

## 2017-12-14 DIAGNOSIS — M1711 Unilateral primary osteoarthritis, right knee: Secondary | ICD-10-CM

## 2017-12-14 DIAGNOSIS — J8410 Pulmonary fibrosis, unspecified: Secondary | ICD-10-CM | POA: Diagnosis not present

## 2017-12-14 DIAGNOSIS — M1712 Unilateral primary osteoarthritis, left knee: Secondary | ICD-10-CM

## 2017-12-14 DIAGNOSIS — R0600 Dyspnea, unspecified: Secondary | ICD-10-CM | POA: Diagnosis not present

## 2017-12-14 DIAGNOSIS — M0579 Rheumatoid arthritis with rheumatoid factor of multiple sites without organ or systems involvement: Secondary | ICD-10-CM | POA: Diagnosis not present

## 2017-12-14 DIAGNOSIS — Z79899 Other long term (current) drug therapy: Secondary | ICD-10-CM | POA: Diagnosis not present

## 2017-12-14 MED ORDER — MORPHINE SULFATE ER 15 MG PO TBCR: 15.0000 mg | EXTENDED_RELEASE_TABLET | Freq: Two times a day (BID) | ORAL | 0 refills | Status: DC

## 2017-12-14 NOTE — Telephone Encounter (Signed)
Reached out to patient to get counts on her pain medication as directed by Danella Sensing, ANP.  Left a voicemail

## 2017-12-14 NOTE — Telephone Encounter (Signed)
Spoke with [name] daughter [name], she reports [name] has 17 tablets of her MS Contin According to the PMP Aware Web-site the MS Contin was filled on 11/03/2017. Spoke with [name] daughter according to count she is not taking the medication as prescribed, since the recent loss of her daughter. This provider believes she needs help with medication management, she agrees. Also state She [name] and her brother will be assisting [name] with medication management. We will e-scribe the MS Contin with a fill date of 12/20/2017. [name] using her MSIR as needed for break through pain, her daughter was instructed to call in a week to evaluate count. She verbalizes understanding.

## 2017-12-14 NOTE — Telephone Encounter (Signed)
Contacted patient and spoke with [name].  She reports that on Friday July 26th, patient had #17 of 15 mg MsContin and #37 of 73m MSIR

## 2017-12-14 NOTE — Progress Notes (Signed)
Office Visit Note   Patient: [name]           Date of Birth: [birth year]           MRN: [id number] Visit Date: 12/14/2017              Requested by: Glendale Chard, [address] [place], [place] [address] PCP: Glendale Chard, MD   Assessment & Plan: Visit Diagnoses:  1. Left hamstring injury, initial encounter     Plan: Her signs and symptoms seem to be consistent with a hamstring tear.  I would like her to try a compressive wrap with an Ace wrap around this area daily.  She can get in the pool for water aerobics.  I would like to reevaluate her in 3 weeks.  She still having significant pain I would like an AP and lateral of the left hip since she has had a hip fracture this year before just to make sure the hardware is intact and in place.  If that looks good we may end up recommending formal physical therapy.  She will take Aleve twice a day for at least the next week.  All question concerns were answered and addressed.  Follow-Up Instructions: Return in about 3 weeks (around 01/04/2018).   Orders:  No orders of the defined types were placed in this encounter.  No orders of the defined types were placed in this encounter.     Procedures: No procedures performed   Clinical Data: No additional findings.   Subjective: Chief Complaint  Patient presents with  . Left Hip - Pain  . Left Leg - Pain  The patient is a very pleasant 77 year old female well-known to me.  We had to perform fixation of a significant intertrochanteric/subtrochanteric left proximal femur fracture a year ago.  She is recently had some thigh pain but she feels like this may have been injury.  Is actually in the hamstring area where she points the source of her pain.  Her daughter reports over significant bruising in this area.  This occurred about 2 weeks ago.  She is someone who is significantly obese with a BMI of 46.  She sees pulmonology at [facility].  She wants to start water  aerobics and wonders if it is okay for her to get in the pool.  She denies any sciatic symptoms she denies any groin pain.  She denies any knee pain on the left side.  She points to the hamstring area on the left side is a source of her pain.  She currently denies any headache or chest pain.  She denies any fever or chills.  She denies any nausea vomiting.  HPI  Review of Systems See above  Objective: Vital Signs: There were no vitals taken for this visit.  Physical Exam She is alert and oriented x3 and in no acute distress Ortho Exam Examination of her left lower extremity she has no pain when I put her hip to internal extra rotation and no pain in the knee but she has significant pain with palpation along the posterior thigh and hamstring area.  This is where the family noted that there was bruising. Specialty Comments:  No specialty comments available.  Imaging: No results found.   PMFS History: Patient Active Problem List   Diagnosis Date Noted  . Pain in left hip 02/18/2017  . Wound infection after surgery, initial encounter 01/16/2017  . Closed nondisplaced intertrochanteric fracture of left femur with delayed  healing 01/14/2017  . Postoperative wound infection 01/14/2017  . Gout 11/29/2016  . Depression 11/29/2016  . Closed left hip fracture ([place]) 11/29/2016  . GERD (gastroesophageal reflux disease) 11/29/2016  . Closed intertrochanteric fracture of hip, left, initial encounter ([place]) 11/29/2016  . Physical deconditioning 07/31/2015  . Pulmonary fibrosis, postinflammatory ([place]) 07/31/2015  . Tendinitis of left rotator cuff 06/13/2015  . Acute on chronic diastolic heart failure ([place]) 04/21/2015  . Acute pulmonary edema (HCC)   . AKI (acute kidney injury) ([place])   . Peripheral edema   . Acute on chronic respiratory failure with hypoxia ([place]) 04/17/2015  . CAP (community acquired pneumonia) 04/17/2015  . Acute kidney injury superimposed on chronic kidney disease ([place])  04/17/2015  . Hypotension 04/17/2015  . Chronic respiratory failure with hypoxia ([place]) 02/09/2015  . Dyspnea and respiratory abnormality 01/03/2015  . Normal coronary arteries 11/02/2014  . Morbid obesity ([place]) 10/30/2014  . Dysphagia 08/21/2014  . Sore throat 07/18/2014  . Chronic fatigue 06/21/2014  . DOE (dyspnea on exertion) 05/24/2014  . High risk medication use 04/17/2014  . Aphasia 02/12/2014  . 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 ([place]) 01/18/2014  . Lumbar facet arthropathy 01/18/2014  . Acute bronchitis 07/24/2013  . Bilateral edema of lower extremity 05/30/2013  . Chronic cough 05/24/2013  . ILD (interstitial lung disease) ([place]) 04/10/2013  . Greater trochanteric bursitis of left hip 08/13/2012  . Spinal stenosis of lumbar region with radiculopathy 07/12/2012  . Inability to walk 07/12/2012  . Acute lumbar back pain 07/12/2012  . Asymptomatic bacteriuria 07/12/2012  . 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 ([place]) 02/17/2012  . Benign neoplasm of meninges ([place]) 02/12/2012  . Brain mass 01/28/2012  . Chest pain with moderate risk of acute coronary syndrome 01/28/2012  . Rheumatoid arthritis ([place]) 01/28/2012  . Primary osteoarthritis of right knee 01/28/2012  . Dyslipidemia   . Thyroid disease   . Essential hypertension    Past Medical History:  Diagnosis Date  . Acute on chronic diastolic (congestive) heart failure ([place])   . Anemia    iron deficiency anemia - secondary to blood loss ( chronic)   . Arthritis    endstage changes bilateral knees/bilateral ankles.   . Asthma   . Carotid artery occlusion   . Chronic fatigue   . Chronic kidney disease   . Closed left hip fracture ([place])   . Clotting disorder ([place])    pt denies this  . Contusion of left knee    due to fall 1/14.  [name] COPD (chronic obstructive pulmonary disease) (HCC)    pulmonary fibrosis  . Depression, reactive   . Diabetes mellitus    type II   . Diastolic  dysfunction   . Difficulty in walking   . Family history of heart disease   . Generalized muscle weakness   . Gout   . High cholesterol   . History of falling   . Hypertension   . Hypothyroidism   . Interstitial lung disease ([place])   . 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    . Morbid obesity ([place])   . Morbid obesity ([place])   . Neuromuscular disorder ([place])    diabetic neuropathy   . Normal coronary arteries    cardiac catheterization performed  10/31/14  . RA (rheumatoid arthritis) ([place])    has been off methotreaxte since 10/13.  [name] Rheumatoid arthritis ([place])   .  Shortness of breath   . Spinal stenosis of lumbar region   . Thyroid disease   . Unspecified lack of coordination   . URI (upper respiratory infection)     Family History  Problem Relation Age of Onset  . Diabetes Mother   . Heart attack Mother   . Hypertension Father   . Lung cancer Father   . Diabetes Sister   . Diabetes Brother   . Hypertension Brother   . Heart disease Brother   . Heart attack Brother   . Kidney cancer Brother   . Uterine cancer Daughter   . Breast cancer Sister   . Rheum arthritis Maternal Uncle   . Gout Brother   . Kidney failure Brother        x 5    Past Surgical History:  Procedure Laterality Date  . ABDOMINAL HYSTERECTOMY    . BRAIN SURGERY     Gamma knife 10/13. Needs repeat spring  '14  . CARDIAC CATHETERIZATION N/A 10/31/2014   Procedure: Right/Left Heart Cath and Coronary Angiography;  Surgeon: [name], MD;  Location: [facility];  Service: Cardiovascular;  Laterality: N/A;  . ESOPHAGOGASTRODUODENOSCOPY (EGD) WITH PROPOFOL N/A 09/14/2014   Procedure: ESOPHAGOGASTRODUODENOSCOPY (EGD) WITH PROPOFOL;  Surgeon: [name], MD;  Location: [facility];  Service: Endoscopy;  Laterality: N/A;  . INCISION AND DRAINAGE HIP Left 01/16/2017   Procedure: IRRIGATION AND  DEBRIDEMENT LEFT HIP;  Surgeon: [name], MD;  Location: [facility];  Service: Orthopedics;  Laterality: Left;  . INTRAMEDULLARY (IM) NAIL INTERTROCHANTERIC Left 11/29/2016   Procedure: INTRAMEDULLARY (IM) NAIL INTERTROCHANTRIC;  Surgeon: [name], MD;  Location: [facility];  Service: Orthopedics;  Laterality: Left;  . OVARY SURGERY    . SHOULDER SURGERY Left   . TONSILLECTOMY  age 79  . VIDEO BRONCHOSCOPY Bilateral 05/31/2013   Procedure: VIDEO BRONCHOSCOPY WITHOUT FLUORO;  Surgeon: Brand Males, MD;  Location: [facility];  Service: Cardiopulmonary;  Laterality: Bilateral;  . video bronscoscopy  2000   lung   Social History   Occupational History  . Occupation: retired Marine scientist  Tobacco Use  . Smoking status: Never Smoker  . Smokeless tobacco: Never Used  Substance and Sexual Activity  . Alcohol use: No  . Drug use: No  . Sexual activity: Not Currently

## 2017-12-16 LAB — TOXASSURE SELECT,+ANTIDEPR,UR

## 2017-12-16 LAB — 6-ACETYLMORPHINE,TOXASSURE ADD
6-ACETYLMORPHINE: NEGATIVE
6-ACETYLMORPHINE: NOT DETECTED ng/mg{creat}

## 2017-12-18 ENCOUNTER — Telehealth: Payer: Self-pay | Admitting: *Deleted

## 2017-12-18 NOTE — Telephone Encounter (Signed)
Urine drug screen for this encounter is consistent for prescribed medication. There was Ethyl Glucuronide present but [name] is a diabetic and there was no ETS which is required to confirm the presence of actual alcohol. Therefore this is considered consistent.

## 2017-12-22 ENCOUNTER — Other Ambulatory Visit: Payer: Medicare Other | Admitting: Licensed Clinical Social Worker

## 2017-12-22 ENCOUNTER — Other Ambulatory Visit: Payer: Medicare Other | Admitting: *Deleted

## 2017-12-22 DIAGNOSIS — Z515 Encounter for palliative care: Secondary | ICD-10-CM

## 2017-12-23 NOTE — Progress Notes (Signed)
COMMUNITY PALLIATIVE CARE SW NOTE  PATIENT NAME: [name] DOB: 1941-01-17 MRN: [id number]  PRIMARY CARE PROVIDER: Glendale Chard, MD  RESPONSIBLE PARTY:  Acct ID - Guarantor Home Phone Work Phone Relationship Acct Type  [id number] [name] Self P/F     [address], [place], [place] [address]     PLAN OF CARE and INTERVENTIONS:             1. GOALS OF CARE/ ADVANCE CARE PLANNING:  Patient's goal is to remain at home with her husband.  She also wants to start swimming again at the [facility].  Patient has a DNR. 2. SOCIAL/EMOTIONAL/SPIRITUAL ASSESSMENT/ INTERVENTIONS:  SW and Palliative Care RN, Daryl Eastern, met with patient in her home.  She discussed her recent visit to the doctor's office and was instructed to rest her hip.  SW provided active listening while patient discussed her family's concerns about her physical and emotional health.  Patient has an appointment with the [facility] Counsel[facility] next week.  Patient's son, [name], is moving in with her and her husband and will assist patient with household needs.  Patient appears to have tools to help in self care and her emotional health. 3. PATIENT/CAREGIVER EDUCATION/ COPING:  Provided education regarding bereavement counseling.  Patient copes by expressing her feelings openly with SW and RN. 4. PERSONAL EMERGENCY PLAN:  Patient contacts her husband or children for medical concerns.  She will also contact her doctor as appropriate. 5. COMMUNITY RESOURCES COORDINATION/ HEALTH CARE NAVIGATION:  Patient had to "let her previous caregiver go".  She is currently attempting to hire another one. 6. FINANCIAL/LEGAL CONCERNS/INTERVENTIONS:  None per patient.     SOCIAL HX:  Social History   Tobacco Use  . Smoking status: Never Smoker  . Smokeless tobacco: Never Used  Substance Use Topics  . Alcohol use: No    CODE STATUS:  DNR  ADVANCED DIRECTIVES: N MOST FORM  COMPLETE:  N HOSPICE EDUCATION PROVIDED: N PPS:  Patient reports her appetite as normal.  She is able to stand and uses her walker to ambulate. Duration of visit and documentation:  110 minutes.      Creola Corn Alfrieda Tarry, LCSW

## 2017-12-24 NOTE — Progress Notes (Signed)
COMMUNITY PALLIATIVE CARE RN NOTE  PATIENT NAME: [name] DOB: 08-Jan-1941 MRN: [id number]  PRIMARY CARE PROVIDER: Glendale Chard, MD  RESPONSIBLE PARTY:  Acct ID - Guarantor Home Phone Work Phone Relationship Acct Type  [id number] [name] Self P/F     [address], [place], [place] [address]    PLAN OF CARE and INTERVENTION:  1. ADVANCE CARE PLANNING/GOALS OF CARE: Remain at home with her husband, avoid hospitalizations, start going to the pool and reading more 2. PATIENT/CAREGIVER EDUCATION: Reinforced Safety/Fall Precautions, Importance of Energy Conservation 3. DISEASE STATUS: Joint visit made with [facility]. Last visit 2 weeks ago, patient was c/o severe pain in her L hip along with bruising from L hip down side of L upper leg. Patient was bending down in a chair trying to clean her floor and feels that she reached down too far. She had an appointment with her Ortho MD who recommended that she take it easy for the next 3 weeks until her next appointment and advised not to scoot around in her desk chair. No issues noted with rod in L hip.  L hip pain has improved and pain medications are starting to become effective again. She continues to take her MS Contin BID and requiring 1-2 MS IR in between for breakthrough pain. States that she wants to get back to the pool again and start reading more to keep her mind off of things. Edema to her bilateral lower extremities have improved since last visit and her daughter is coming over daily to put TED hose on. She continues to utilize her Oxygen at 2L/min via [place] continuously throughout the night, but only uses PRN during the day. She has an appointment with a Bereavement Counselor next week and is looking forward to this since the recent death of one of her daughters. Her son, [name] is going to move in with patient indefinitely to assist her with household needs. Will continue to monitor patient  progress.  HISTORY OF PRESENT ILLNESS:  This is a 77 yo female who resides at home with her husband. Palliative Care continues to follow patient. Next visit scheduled in 2 weeks.  CODE STATUS: DNR  ADVANCED DIRECTIVES: N MOST FORM: no PPS: 40%   PHYSICAL EXAM:   VITALS: Today's Vitals   12/22/17 1244  BP: 140/76  Pulse: 89  Resp: 17  SpO2: 92%  PainSc: 0-No pain    LUNGS: decreased breath sounds CARDIAC: Cor RRR EXTREMITIES: 2+ edema bilateral lower extremities, TED hose on SKIN: Exposed skin dry and intact, bruises easily  NEURO: Alert and oriented, pleasant mood, generalized weakness, ambulatory with walker   (Duration of visit and documentation 110 minutes)    Daryl Eastern, RN, BSN

## 2018-01-06 ENCOUNTER — Ambulatory Visit (INDEPENDENT_AMBULATORY_CARE_PROVIDER_SITE_OTHER): Payer: Medicare Other

## 2018-01-06 ENCOUNTER — Encounter (INDEPENDENT_AMBULATORY_CARE_PROVIDER_SITE_OTHER): Payer: Self-pay | Admitting: Orthopaedic Surgery

## 2018-01-06 ENCOUNTER — Other Ambulatory Visit (INDEPENDENT_AMBULATORY_CARE_PROVIDER_SITE_OTHER): Payer: Self-pay

## 2018-01-06 ENCOUNTER — Ambulatory Visit (INDEPENDENT_AMBULATORY_CARE_PROVIDER_SITE_OTHER): Payer: Medicare Other | Admitting: Orthopaedic Surgery

## 2018-01-06 DIAGNOSIS — M25551 Pain in right hip: Secondary | ICD-10-CM

## 2018-01-06 DIAGNOSIS — S76302D Unspecified injury of muscle, fascia and tendon of the posterior muscle group at thigh level, left thigh, subsequent encounter: Secondary | ICD-10-CM

## 2018-01-06 DIAGNOSIS — M25552 Pain in left hip: Principal | ICD-10-CM

## 2018-01-06 NOTE — Progress Notes (Signed)
The patient is very well-known to me.  She is a 77 year old who is morbidly obese and recovering from a proximal femur fracture of her left hip.  She actually recovered from that surgery but then had an acute hamstring tear on the left side 3 weeks ago.  She is still struggling getting around with a walker and having pain.  X-rays of her pelvis today showed that the fracture is healed completely but the rest of the x-rays obscured in terms of his bony anatomy.  I did not see any calcifications around the issue him on either side.  She still having significant amount of hamstring pain bilaterally.  She does have low back pain as well.  This point I think she would benefit from outpatient physical therapy.  I counseled her about weight loss as well.  I would like to see her back in 6 weeks after course of outpatient physical therapy that will hopefully help loosen up her hamstrings and decrease her pain and help with her back as well.  At her visit in 6 weeks without follow-up I would like 3 views of her right ankle due to increasing ankle pain with a chronic deformity.

## 2018-01-11 ENCOUNTER — Encounter: Payer: Self-pay | Admitting: Physical Medicine & Rehabilitation

## 2018-01-11 ENCOUNTER — Encounter: Payer: Medicare Other | Attending: Physical Medicine & Rehabilitation | Admitting: Physical Medicine & Rehabilitation

## 2018-01-11 VITALS — BP 127/78 | HR 94 | Ht 64.0 in | Wt 270.0 lb

## 2018-01-11 DIAGNOSIS — E1122 Type 2 diabetes mellitus with diabetic chronic kidney disease: Secondary | ICD-10-CM | POA: Diagnosis not present

## 2018-01-11 DIAGNOSIS — M5416 Radiculopathy, lumbar region: Secondary | ICD-10-CM | POA: Diagnosis not present

## 2018-01-11 DIAGNOSIS — M47816 Spondylosis without myelopathy or radiculopathy, lumbar region: Secondary | ICD-10-CM | POA: Diagnosis not present

## 2018-01-11 DIAGNOSIS — M069 Rheumatoid arthritis, unspecified: Secondary | ICD-10-CM | POA: Insufficient documentation

## 2018-01-11 DIAGNOSIS — Z9889 Other specified postprocedural states: Secondary | ICD-10-CM | POA: Insufficient documentation

## 2018-01-11 DIAGNOSIS — Z90710 Acquired absence of both cervix and uterus: Secondary | ICD-10-CM | POA: Diagnosis not present

## 2018-01-11 DIAGNOSIS — M1712 Unilateral primary osteoarthritis, left knee: Secondary | ICD-10-CM

## 2018-01-11 DIAGNOSIS — M7062 Trochanteric bursitis, left hip: Secondary | ICD-10-CM | POA: Diagnosis not present

## 2018-01-11 DIAGNOSIS — F329 Major depressive disorder, single episode, unspecified: Secondary | ICD-10-CM | POA: Diagnosis not present

## 2018-01-11 DIAGNOSIS — M1711 Unilateral primary osteoarthritis, right knee: Secondary | ICD-10-CM | POA: Diagnosis not present

## 2018-01-11 DIAGNOSIS — M48062 Spinal stenosis, lumbar region with neurogenic claudication: Secondary | ICD-10-CM | POA: Insufficient documentation

## 2018-01-11 DIAGNOSIS — E1142 Type 2 diabetes mellitus with diabetic polyneuropathy: Secondary | ICD-10-CM | POA: Insufficient documentation

## 2018-01-11 MED ORDER — MORPHINE SULFATE ER 15 MG PO TBCR: 15.0000 mg | EXTENDED_RELEASE_TABLET | Freq: Two times a day (BID) | ORAL | 0 refills | Status: DC

## 2018-01-11 MED ORDER — MORPHINE SULFATE 15 MG PO TABS: 15.0000 mg | ORAL_TABLET | Freq: Two times a day (BID) | ORAL | 0 refills | Status: DC | PRN

## 2018-01-11 NOTE — Patient Instructions (Signed)
PLEASE TAKE YOUR THERAPY WORK TO HEART. YOU NEED TO CARRY OVER THE SKILLS YOU LEARN.

## 2018-01-11 NOTE — Progress Notes (Signed)
Subjective:    Patient ID: [name], female    DOB: April 17, 1941, 77 y.o.   MRN: [id number]  HPI   Mrs. Schissler is here in follow-up of her chronic pain.  She finally has her power wheelchair but does not have a way to attach it to her car.  She still is using her manual chair and moving around in the house with her walker.  She remains limited due to her pain in her back and hips.  Knees and ankles also are problematic.  She has seen orthopedic surgery who has ordered therapy for her.  Goal was to improve flexibility and to increase core muscle strength.  She remains on MS Contin and morphine sulfate immediate release for pain control which provide some relief.  She is wearing oxygen generally at 2 L.  Have been no changes in her pulmonary function recently per patient.  Pain Inventory Average Pain 6 Pain Right Now 5 My pain is sharp, burning, dull and stabbing  In the last 24 hours, has pain interfered with the following? General activity 7 Relation with others 7 Enjoyment of life 7 What TIME of day is your pain at its worst? morning Sleep (in general) Fair  Pain is worse with: walking, bending, standing and some activites Pain improves with: rest and medication Relief from Meds: 8  Mobility use a cane use a walker ability to climb steps?  no do you drive?  no use a wheelchair  Function retired  Neuro/Psych bladder control problems weakness numbness trouble walking spasms  Prior Studies Any changes since last visit?  yes bone scan  Physicians involved in your care Any changes since last visit?  no   Family History  Problem Relation Age of Onset  . Diabetes Mother   . Heart attack Mother   . Hypertension Father   . Lung cancer Father   . Diabetes Sister   . Diabetes Brother   . Hypertension Brother   . Heart disease Brother   . Heart attack Brother   . Kidney cancer Brother   . Uterine cancer Daughter   . Breast cancer Sister   . Rheum  arthritis Maternal Uncle   . Gout Brother   . Kidney failure Brother        x 5   Social History   Socioeconomic History  . Marital status: Married    Spouse name: Not on file  . Number of children: 6  . Years of education: college  . Highest education level: Not on file  Occupational History  . Occupation: retired Ship broker  . Financial resource strain: Not on file  . Food insecurity:    Worry: Not on file    Inability: Not on file  . Transportation needs:    Medical: Not on file    Non-medical: Not on file  Tobacco Use  . Smoking status: Never Smoker  . Smokeless tobacco: Never Used  Substance and Sexual Activity  . Alcohol use: No  . Drug use: No  . Sexual activity: Not Currently  Lifestyle  . Physical activity:    Days per week: Not on file    Minutes per session: Not on file  . Stress: Not on file  Relationships  . Social connections:    Talks on phone: Not on file    Gets together: Not on file    Attends religious service: Not on file    Active member of club or organization: Not  on file    Attends meetings of clubs or organizations: Not on file    Relationship status: Not on file  Other Topics Concern  . Not on file  Social History Narrative   Patient consumes 2-3 cups coffee per day.   Past Surgical History:  Procedure Laterality Date  . ABDOMINAL HYSTERECTOMY    . BRAIN SURGERY     Gamma knife 10/13. Needs repeat spring  '14  . CARDIAC CATHETERIZATION N/A 10/31/2014   Procedure: Right/Left Heart Cath and Coronary Angiography;  Surgeon: [name], MD;  Location: [facility];  Service: Cardiovascular;  Laterality: N/A;  . ESOPHAGOGASTRODUODENOSCOPY (EGD) WITH PROPOFOL N/A 09/14/2014   Procedure: ESOPHAGOGASTRODUODENOSCOPY (EGD) WITH PROPOFOL;  Surgeon: [name], MD;  [facility];  Service: Endoscopy;  Laterality: N/A;  . INCISION AND DRAINAGE HIP Left 01/16/2017   Procedure: IRRIGATION AND DEBRIDEMENT LEFT HIP;   Surgeon: [name], MD;  [facility];  Service: Orthopedics;  Laterality: Left;  . INTRAMEDULLARY (IM) NAIL INTERTROCHANTERIC Left 11/29/2016   Procedure: INTRAMEDULLARY (IM) NAIL INTERTROCHANTRIC;  Surgeon: [name], MD;  Location: [facility];  Service: Orthopedics;  Laterality: Left;  . OVARY SURGERY    . SHOULDER SURGERY Left   . TONSILLECTOMY  age 79  . VIDEO BRONCHOSCOPY Bilateral 05/31/2013   Procedure: VIDEO BRONCHOSCOPY WITHOUT FLUORO;  Surgeon: Brand Males, MD;  Location: [facility];  Service: Cardiopulmonary;  Laterality: Bilateral;  . video bronscoscopy  2000   lung   Past Medical History:  Diagnosis Date  . Acute on chronic diastolic (congestive) heart failure ([place])   . Anemia    iron deficiency anemia - secondary to blood loss ( chronic)   . Arthritis    endstage changes bilateral knees/bilateral ankles.   . Asthma   . Carotid artery occlusion   . Chronic fatigue   . Chronic kidney disease   . Closed left hip fracture ([place])   . Clotting disorder ([place])    pt denies this  . Contusion of left knee    due to fall 1/14.  [name] COPD (chronic obstructive pulmonary disease) (HCC)    pulmonary fibrosis  . Depression, reactive   . Diabetes mellitus    type II   . Diastolic dysfunction   . Difficulty in walking   . Family history of heart disease   . Generalized muscle weakness   . Gout   . High cholesterol   . History of falling   . Hypertension   . Hypothyroidism   . Interstitial lung disease ([place])   . 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    . Morbid obesity ([place])   . Morbid obesity ([place])   . Neuromuscular disorder ([place])    diabetic neuropathy   . Normal coronary arteries    cardiac catheterization performed  10/31/14  . RA (rheumatoid arthritis) ([place])    has been off methotreaxte since 10/13.  [name] Rheumatoid arthritis ([place])   . Shortness of  breath   . Spinal stenosis of lumbar region   . Thyroid disease   . Unspecified lack of coordination   . URI (upper respiratory infection)    BP 127/78   Pulse 94   Ht _0  (1.626 m)   Wt 270 lb (122.5 kg)   SpO2 97%   BMI 46.35 kg/m   Opioid Risk Score:   Fall Risk Score:  `1  Depression screen PHQ 2/9  Depression screen [facility] 2/9 12/09/2017 10/22/2017 09/22/2017 07/28/2017 06/30/2017 06/03/2017 05/07/2017  Decreased Interest 1 0 0 0 0 0 0  Down, Depressed, Hopeless 1 0 0 0 0 0 0  PHQ - 2 Score 2 0 0 0 0 0 0  Altered sleeping - - - - - - -  Tired, decreased energy - - - - - - -  Change in appetite - - - - - - -  Feeling bad or failure about yourself  - - - - - - -  Trouble concentrating - - - - - - -  Moving slowly or fidgety/restless - - - - - - -  Suicidal thoughts - - - - - - -  PHQ-9 Score - - - - - - -  Difficult doing work/chores - - - - - - -  Some recent data might be hidden    Review of Systems  Constitutional: Negative.   HENT: Negative.   Eyes: Negative.   Respiratory: Negative.   Cardiovascular: Negative.   Gastrointestinal: Negative.   Endocrine: Negative.   Genitourinary: Negative.   Musculoskeletal: Negative.   Skin: Negative.   Allergic/Immunologic: Negative.   Neurological: Negative.   Hematological: Negative.   Psychiatric/Behavioral: Negative.   All other systems reviewed and are negative.      Objective:   Physical Exam  General: No acute distress.  Obese HEENT: EOMI, oral membranes moist Cards: reg rate  Chest: normal effort, 2l oxygen Abdomen: Soft, NT, ND Skin: dry, intact Extremities: Significant edema right greater than left lower extremities. Neuro:Pt is cognitively appropriate with normal insight, memory, and awareness. Cranial nerves 2-12 are intact. Sensory exam is near normal inbothlegs... Reflexes are 2+ in all 4's. Fine motor coordination is intact. No tremors. Motor function is grossly 4 to 5/5 with pain inhbition.    Musculoskeletal: . Full ROM at left knee. Valgus deformity right knee is persistent. Right ankle without pain. Does pronate right foot. Was in w/c today. Hamstrings tight.  Psych:Pt's affect is appropriate. Pt is cooperative   Assessment & Plan:  Assessment:  1. Lumbar spinal stenosis with neurogenic claudication. Associated facet arthropathy  2. Depression.  3. Diabetes mellitus type 2 with polyneuropathy  4. Rheumatoid arthritis and osteoarthritis. Multiple joint involvement including knees.  5. Left greater troch bursitis  6. Interstitial lung disease  7.Hx of left hip fracture, post-op infection in July 2017.  Patient with ongoing left hip pain along the lateral aspect/greater trochanter.    Plan:  1.Continue with HEP,  But I am totally for the outpt therapies. Needs to be able to carry over principles she learns at home. Diet and weight loss were again discussed today as well. Gave the patient extensive knee strengthening exercises today 2. Voltaren gel for fingers/knees. Injections if needed 3. MSO4 for breakthrough pain, one q8 prn #90--  -MS Contin 70m CR q12 #60.second rx'es for next month -We will continue the controlled substance monitoring program, this consists of regular clinic visits, examinations, routine drug screening, pill counts [place] well [place].   4 .Pulmonary mgt per pulmonologist. 2L oxygen to continue. 5. Continue cymbalta 67mdaily.  6. Follow up withNP in [id number] of face to face patient care time were spent during this visit. All questions were encouraged and answered.

## 2018-01-12 ENCOUNTER — Other Ambulatory Visit: Payer: Medicare Other | Admitting: *Deleted

## 2018-01-12 ENCOUNTER — Other Ambulatory Visit: Payer: Medicare Other | Admitting: Licensed Clinical Social Worker

## 2018-01-12 DIAGNOSIS — Z515 Encounter for palliative care: Secondary | ICD-10-CM

## 2018-01-13 ENCOUNTER — Other Ambulatory Visit: Payer: Self-pay

## 2018-01-13 ENCOUNTER — Ambulatory Visit: Payer: Medicare Other | Attending: Orthopaedic Surgery | Admitting: Physical Therapy

## 2018-01-13 ENCOUNTER — Encounter: Payer: Self-pay | Admitting: Physical Therapy

## 2018-01-13 DIAGNOSIS — G8929 Other chronic pain: Secondary | ICD-10-CM | POA: Insufficient documentation

## 2018-01-13 DIAGNOSIS — M545 Low back pain: Secondary | ICD-10-CM | POA: Insufficient documentation

## 2018-01-13 DIAGNOSIS — M79605 Pain in left leg: Secondary | ICD-10-CM | POA: Insufficient documentation

## 2018-01-13 DIAGNOSIS — R2689 Other abnormalities of gait and mobility: Secondary | ICD-10-CM | POA: Insufficient documentation

## 2018-01-13 NOTE — Therapy (Signed)
[place], [place], [address] Phone: [phone]   Fax:  [phone]  Physical Therapy Evaluation  Patient Details  Name: [name] MRN: [id number] Date of Birth: 01-May-1941 Referring Provider: Zollie Beckers MD   Encounter Date: 01/13/2018  PT End of Session - 01/13/18 1601    Visit Number  1    Number of Visits  17    Date for PT Re-Evaluation  03/10/18    Authorization Type  medicare progress note at 10th visit and Kx mod by 15th visit    PT Start Time  1548    PT Stop Time  1547    PT Time Calculation (min)  1439 min    Activity Tolerance  Patient tolerated treatment well;Patient limited by pain    Behavior During Therapy  [facility] for tasks assessed/performed       Past Medical History:  Diagnosis Date  . Acute on chronic diastolic (congestive) heart failure ([place])   . Anemia    iron deficiency anemia - secondary to blood loss ( chronic)   . Arthritis    endstage changes bilateral knees/bilateral ankles.   . Asthma   . Carotid artery occlusion   . Chronic fatigue   . Chronic kidney disease   . Closed left hip fracture ([place])   . Clotting disorder ([place])    pt denies this  . Contusion of left knee    due to fall 1/14.  [name] COPD (chronic obstructive pulmonary disease) (HCC)    pulmonary fibrosis  . Depression, reactive   . Diabetes mellitus    type II   . Diastolic dysfunction   . Difficulty in walking   . Family history of heart disease   . Generalized muscle weakness   . Gout   . High cholesterol   . History of falling   . Hypertension   . Hypothyroidism   . Interstitial lung disease ([place])   . 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    . Morbid obesity ([place])   . Morbid obesity ([place])   . Neuromuscular disorder ([place])    diabetic neuropathy   . Normal coronary arteries    cardiac  catheterization performed  10/31/14  . RA (rheumatoid arthritis) ([place])    has been off methotreaxte since 10/13.  [name] Rheumatoid arthritis ([place])   . Shortness of breath   . Spinal stenosis of lumbar region   . Thyroid disease   . Unspecified lack of coordination   . URI (upper respiratory infection)     Past Surgical History:  Procedure Laterality Date  . ABDOMINAL HYSTERECTOMY    . BRAIN SURGERY     Gamma knife 10/13. Needs repeat spring  '14  . CARDIAC CATHETERIZATION N/A 10/31/2014   Procedure: Right/Left Heart Cath and Coronary Angiography;  Surgeon: [name], MD;  Location: [facility];  Service: Cardiovascular;  Laterality: N/A;  . ESOPHAGOGASTRODUODENOSCOPY (EGD) WITH PROPOFOL N/A 09/14/2014   Procedure: ESOPHAGOGASTRODUODENOSCOPY (EGD) WITH PROPOFOL;  Surgeon: [name], MD;  [facility];  Service: Endoscopy;  Laterality: N/A;  . INCISION AND DRAINAGE HIP Left 01/16/2017   Procedure: IRRIGATION AND DEBRIDEMENT LEFT HIP;  Surgeon: [name], MD;  [facility];  Service: Orthopedics;  Laterality: Left;  . INTRAMEDULLARY (IM) NAIL INTERTROCHANTERIC Left 11/29/2016   Procedure: INTRAMEDULLARY (IM) NAIL INTERTROCHANTRIC;  Surgeon: [name], MD;  Location: [facility];  Service: Orthopedics;  Laterality: Left;  . OVARY SURGERY    . SHOULDER SURGERY Left   . TONSILLECTOMY  age 40  . VIDEO BRONCHOSCOPY Bilateral 05/31/2013   Procedure: VIDEO BRONCHOSCOPY WITHOUT FLUORO;  Surgeon: Brand Males, MD;  Location: [facility];  Service: Cardiopulmonary;  Laterality: Bilateral;  . video bronscoscopy  2000   lung    There were no vitals filed for this visit.   Subjective Assessment - 01/13/18 1512    Subjective  pt is a 77 y.o F with CC of bil hip and low back pain has been going on for a long time. she fell and broke her L femur in July of 2018 and went to rehab and got better. At home she reported she would use her legs while  was sitting her chair to scoot around the house and she felt intense pain and pulled a muscle on the L when she was trying vacuum while she was sitting in her chair that occured 4 weeks ago. She reports since the injury it has gotten better but still hurts to walk in the hips.    Pertinent History  significant PMhx, currenlty on 2 LPM O2    Limitations  Lifting;Standing;Walking;House hold activities    How long can you sit comfortably?  unlimited depending on temperature and chair surface    How long can you stand comfortably?  with RW 5 min     How long can you walk comfortably?  with RW 5-10     Diagnostic tests  x-ray 8/21    Patient Stated Goals  to be able to walk, and move without pain, get back to using a SPC    Currently in Pain?  Yes    Pain Score  6    last took medication 10:30am, at worst 10/10   Pain Location  Hip    Pain Orientation  Left;Right;Posterior;Lateral   L>R   Pain Descriptors / Indicators  Sharp    Pain Type  Chronic pain    Pain Onset  More than a month ago    Pain Frequency  Intermittent    Aggravating Factors   with activity, bending forward, standing, walking    Pain Relieving Factors  resting, medication, ice    Effect of Pain on Daily Activities  limited endurance    Multiple Pain Sites  Yes    Pain Score  3   at worst 8/10   Pain Location  Back    Pain Orientation  Right;Left;Lower    Pain Descriptors / Indicators  Sharp    Pain Type  Chronic pain    Pain Onset  More than a month ago    Pain Frequency  Intermittent    Aggravating Factors   with activity, standing/ walking, rolling over in bed,     Pain Relieving Factors   medication, ice, patches          [facility] PT Assessment - 01/13/18 1513      Assessment   Medical Diagnosis  bil hip and low back pain    Referring Provider  Zollie Beckers MD    Onset Date/Surgical Date  --   recent injury 4 weeks ago chronic pain for years   Hand Dominance  Right    Next MD Visit  --   October 2019    Prior Therapy  yes      Precautions   Precaution Comments  don't get not the pool,  Restrictions   Weight Bearing Restrictions  No      Balance Screen   Has the patient fallen in the past 6 months  No      [place] residence    Living Arrangements  Children    Available Help at Discharge  Family;Available PRN/intermittently    Type of Home  House    Home Access  Level entry    Home Layout  Two level    Alternate Level Stairs-Number of Steps  [place] - 2 wheels;Cane - single point;Shower seat;Toilet riser;Wheelchair - Education officer, community - power   oxygen, rollator     Prior Function   Level of Independence  Independent with basic ADLs    Vocation  Retired      Charity fundraiser Status  Within Functional Limits for tasks assessed      Observation/Other Assessments   Focus on Therapeutic Outcomes (FOTO)   69% limited   predicted 60% limited     Posture/Postural Control   Posture/Postural Control  Postural limitations    Postural Limitations  Rounded Shoulders;Forward head      ROM / Strength   AROM / PROM / Strength  AROM;PROM;Strength      AROM   AROM Assessment Site  Hip;Lumbar    Right/Left Hip  Right;Left    Lumbar Flexion  38    Lumbar Extension  10    Lumbar - Right Side Bend  3    Lumbar - Left Side Bend  10      Strength   Strength Assessment Site  Hip;Knee    Right/Left Hip  Right;Left    Right Hip Flexion  4-/5    Right Hip Extension  3+/5    Right Hip ABduction  3/5    Right Hip ADduction  3+/5    Left Hip Flexion  3+/5    Left Hip Extension  3+/5    Left Hip ABduction  3/5    Left Hip ADduction  3+/5    Right/Left Knee  Right;Left    Right Knee Flexion  4+/5    Right Knee Extension  4/5    Left Knee Flexion  4-/5    Left Knee Extension  4+/5      Palpation   Palpation comment  TTP along the biceps femors on the L and bil lumbar paraspinals      Ambulation/Gait    Ambulation/Gait  Yes    Ambulation/Gait Assistance  7: Independent    Assistive device  Rolling walker    Gait Pattern  Step-through pattern;Decreased stride length;Trendelenburg;Antalgic;Trunk flexed;Decreased trunk rotation                Objective measurements completed on examination: See above findings.              PT Education - 01/13/18 1515    Education Details  evaluation findings, POC, goals, HEp with proper form/ rationale,    Person(s) Educated  Patient    Methods  Explanation;Verbal cues;Handout    Comprehension  Verbalized understanding;Verbal cues required       PT Short Term Goals - 01/13/18 1711      PT SHORT TERM GOAL #1   Title  pt to be I with initial HEP     Time  4    Period  Weeks    Status  New    Target Date  02/10/18  PT SHORT TERM GOAL #2   Title  pt to verbalize/ demo proper posture and gait mechanics to prevent and reduce hip and low back pain     Time  4    Period  Weeks    Status  New    Target Date  02/10/18      PT SHORT TERM GOAL #3   Title  pt to be able to stand / ambulate >/= 10 with LRAD with </= 6/10 pain to promote therapeutic progression     Time  4    Period  Weeks    Status  New    Target Date  02/10/18        PT Long Term Goals - 01/13/18 1712      PT LONG TERM GOAL #1   Title  pt to increase bil Le strength to >/= 4/5 to promote hip/ knee stability and maximzie safety with prolonged standing/ walking activity    Time  8    Period  Weeks    Status  New    Target Date  03/10/18      PT LONG TERM GOAL #2   Title  pt to be able to ambulate >/= 30 min with LRAD reporting </= 4/10 pain for functional endurance for community and in home ambulation    Time  8    Period  Weeks    Status  New    Target Date  03/10/18      PT LONG TERM GOAL #3   Title  pt increase trunk mobility by >/= 10 degrees in flexion and sidebending to with </= 3/10 pain to assist with functional mobility required for ADL s     Time  8    Period  Weeks    Status  New    Target Date  03/10/18      PT LONG TERM GOAL #4   Title  incresae FOTO score to </= 60% limited to demo improvement in function    Time  8    Period  Weeks    Status  New    Target Date  03/10/18      PT LONG TERM GOAL #5   Title  pt to be I with all HEP given as of last visit to maintain and progress current level of function     Time  8    Period  Weeks    Status  New    Target Date  03/10/18             Plan - 01/13/18 1610    Clinical Impression Statement  pt presents to OPPT with CC of chronic hx or low back pain and L healed femure fracture that she had therpay for that occurred over 1 year ago, and experience hamstring strain In July 2019. She demonstrates limited hip strength and trunk mobility. plan to assess hip mobility next session. limited endurnace with walking standing secondary to hip/back pain and overall decondition. she would benefit from physical therpay to decrease pain, improve moblility, and maximize endurnace and overal function by addressing the deficits listed.     History and Personal Factors relevant to plan of care:  hx of gout, diabetes,hx of O2 use and required use of power W/C    Clinical Presentation  Unstable    Clinical Presentation due to:  limited strength, limited trunk mobility, abnormal gait, continued pain fluctuating in nature    Clinical Decision Making  High  Rehab Potential  Good    PT Frequency  2x / week    PT Duration  8 weeks    PT Treatment/Interventions  ADLs/Self Care Home Management;Cryotherapy;Electrical Stimulation;Iontophoresis 59m/ml Dexamethasone;Moist Heat;Ultrasound;Balance training;Stair training;Functional mobility training;Therapeutic activities;Therapeutic exercise;Manual techniques;Taping;Dry needling;Gait training    PT Next Visit Plan  review/ update HEP PRN, assess hip ROM, hamstring stretching gently, core activation, gait trainining and endurance    PT Home  Exercise Plan  lower trunk rotation, gentle hamstring stretching (sitting and supine), posterior pelvic tilt.     Consulted and Agree with Plan of Care  Patient       Patient will benefit from skilled therapeutic intervention in order to improve the following deficits and impairments:  Abnormal gait, Pain, Decreased activity tolerance, Decreased balance, Postural dysfunction, Improper body mechanics, Decreased strength  Visit Diagnosis: Chronic bilateral low back pain without sciatica  Pain in left leg  Other abnormalities of gait and mobility     Problem List Patient Active Problem List   Diagnosis Date Noted  . Pain in left hip 02/18/2017  . Wound infection after surgery, initial encounter 01/16/2017  . Closed nondisplaced intertrochanteric fracture of left femur with delayed healing 01/14/2017  . Postoperative wound infection 01/14/2017  . Gout 11/29/2016  . Depression 11/29/2016  . Closed left hip fracture ([place] 11/29/2016  . GERD (gastroesophageal reflux disease) 11/29/2016  . Closed intertrochanteric fracture of hip, left, initial encounter ([facility] 11/29/2016  . Physical deconditioning 07/31/2015  . Pulmonary fibrosis, postinflammatory ([facility] 07/31/2015  . Tendinitis of left rotator cuff 06/13/2015  . Acute on chronic diastolic heart failure ([facility] 04/21/2015  . Acute pulmonary edema (HCC)   . AKI (acute kidney injury) ([facility]   . Peripheral edema   . Acute on chronic respiratory failure with hypoxia ([place] 04/17/2015  . CAP (community acquired pneumonia) 04/17/2015  . Acute kidney injury superimposed on chronic kidney disease (HB[place] 04/17/2015  . Hypotension 04/17/2015  . Chronic respiratory failure with hypoxia ([facility] 02/09/2015  . Dyspnea and respiratory abnormality 01/03/2015  . Normal coronary arteries 11/02/2014  . Morbid obesity ([facility] 10/30/2014  . Dysphagia 08/21/2014  . Sore throat 07/18/2014  . Chronic fatigue 06/21/2014  . DOE (dyspnea on exertion) 05/24/2014   . High risk medication use 04/17/2014  . Aphasia 02/12/2014  . 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 ([facility] 01/18/2014  . Lumbar facet arthropathy 01/18/2014  . Acute bronchitis 07/24/2013  . Bilateral edema of lower extremity 05/30/2013  . Chronic cough 05/24/2013  . ILD (interstitial lung disease) (HB[place] 04/10/2013  . Greater trochanteric bursitis of left hip 08/13/2012  . Spinal stenosis of lumbar region with radiculopathy 07/12/2012  . Inability to walk 07/12/2012  . Acute lumbar back pain 07/12/2012  . Asymptomatic bacteriuria 07/12/2012  . 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 (HS[place] 02/17/2012  . Benign neoplasm of meninges ([place] 02/12/2012  . Brain mass 01/28/2012  . Chest pain with moderate risk of acute coronary syndrome 01/28/2012  . Rheumatoid arthritis (HPa[place] 01/28/2012  . Primary osteoarthritis of right knee 01/28/2012  . Dyslipidemia   . Thyroid disease   . Essential hypertension    KStarr LakePT, DPT, LAT, ATC  01/13/18  5:16 PM      [facility] [address]Phone: [phone]  Fax:  [phone] Name: Joan HASHMANMRN: [id number] of Birth: [birth year]

## 2018-01-13 NOTE — Progress Notes (Signed)
COMMUNITY PALLIATIVE CARE SW NOTE  PATIENT NAME: [name] DOB: 09-Jun-1940 MRN: [id number]  PRIMARY CARE PROVIDER: Glendale Chard, MD  RESPONSIBLE PARTY:  Acct ID - Guarantor Home Phone Work Phone Relationship Acct Type  [id number] [name] Self P/F     [address], [place], [place] [address]     PLAN OF CARE and INTERVENTIONS:             1. GOALS OF CARE/ ADVANCE CARE PLANNING:  Patient's goal is to remain at home with her husband.  She has a DNR. 2. SOCIAL/EMOTIONAL/SPIRITUAL ASSESSMENT/ INTERVENTIONS:  SW and Palliative Care RN, Daryl Eastern, met with patient in her home.  Her husbnd, son, and granddaughter were at home, but did not participate in the visit.  Patient complained of leg pain and is supposed to begin outpatient PT.  She has been using her O2 more also.  SW provided active listening and supportive counseling while patient expressed her adjustment issues related to her illness.  She requires more rest after activities.  She is meeting with the Bereavement counselor again tomorrow.  Patient was also able to travel to [place] for her sister's birthday, which she said she enjoyed. 3. PATIENT/CAREGIVER EDUCATION/ COPING:  Patient is very articulate and expresses her thoughts openly.  She copes by protecting her children, therefore, she does not inform them when she is having difficulty coping. 4. PERSONAL EMERGENCY PLAN:  Patient will inform one of children or husband. 5. COMMUNITY RESOURCES COORDINATION/ HEALTH CARE NAVIGATION:  Patient's son has moved in with her and is providing help with cooking and cleaning. 6. FINANCIAL/LEGAL CONCERNS/INTERVENTIONS:  None per patient.     SOCIAL HX:  Social History   Tobacco Use  . Smoking status: Never Smoker  . Smokeless tobacco: Never Used  Substance Use Topics  . Alcohol use: No    CODE STATUS:  DNR  ADVANCED DIRECTIVES: N MOST FORM COMPLETE:  N HOSPICE EDUCATION PROVIDED:  N PPS:   Patient's appetite is normal.  She is able to stand independently and uses a walker.  She said her MD suggested she use a motorized w/c more. Duration of visit and documentation:  100 minutes.      Creola Corn Franko Hilliker, LCSW

## 2018-01-19 NOTE — Progress Notes (Signed)
COMMUNITY PALLIATIVE CARE RN NOTE  PATIENT NAME: TAQWA DEEM DOB: [birth year] MRN: [id number]  PRIMARY CARE PROVIDER: Glendale Chard, MD  RESPONSIBLE PARTY:  Acct ID - Guarantor Home Phone Work Phone Relationship Acct Type  [id number] [name] Self P/F     [address], [place], [place] [address]    PLAN OF CARE and INTERVENTION:  1. ADVANCE CARE PLANNING/GOALS OF CARE: Remain at home with her husband, avoid hospitalizations,  2. PATIENT/CAREGIVER EDUCATION: Reinforced Importance of Energy Conservation 3. DISEASE STATUS: Joint visit made with [place]. Patient sitting at the dining room table upon arrival. Reports that she is experiencing increased pain in her L hip and  R knee. She reports that she felt that she "did too much" yesterday when she was feeling good. [facility]. She says that her MD wants her to start utilizing her power chair more. She saw her Ortho MD last week and he is to arrange Outpatient PT, as he feels that she needs to stretch more, per patient report. Last evening, she states that she had a major episode of cramping in both of her lower legs which was very painful. She continues on MS Contin BID and is requiring MS IR tablets twice daily to help control pain. She also has a muscle relaxant, but states that she only takes it during the night as it makes her too drowsy during the day. She had a scan done on her L hip last week and her surgical site is WNL. She is having to wear her Oxygen more during the day, as her sats drop into the low 80s with exertion. She does ok walking short distances. She travelled to [place] to her sister's 75th birthday on the 17th which she enjoyed and without any issues. Her CBGs have been more stable, as she states that she is watching her diet better. She has an appointment with the Bereavement Counselor tomorrow.  HISTORY OF PRESENT  ILLNESS: This is a 77 yo female who resides at home with her husband. Palliative Care to continue to follow to assess overall condition and assist with symptom management needs. Next visit scheduled in 1 month.   CODE STATUS: DNR  ADVANCED DIRECTIVES: N MOST FORM: no PPS: 40%   PHYSICAL EXAM:   VITALS: Today's Vitals   01/12/18 1239  BP: 132/90  Pulse: 85  Resp: 17  SpO2: 97%  PainSc: 4   PainLoc: Knee    LUNGS: clear to auscultation  CARDIAC: Cor RRR EXTREMITIES: 2+ edema, pitting SKIN: Skin color, texture, turgor normal. No rashes or lesions  NEURO: Alert and oriented x 4, forgetful at times, ambulatory with walker   (Duration of visit and documentation 90 minutes)    Daryl Eastern, RN, BSN

## 2018-01-20 ENCOUNTER — Ambulatory Visit: Payer: Medicare Other | Attending: Orthopaedic Surgery | Admitting: Physical Therapy

## 2018-01-20 ENCOUNTER — Encounter: Payer: Self-pay | Admitting: Physical Therapy

## 2018-01-20 DIAGNOSIS — R2689 Other abnormalities of gait and mobility: Secondary | ICD-10-CM

## 2018-01-20 DIAGNOSIS — M79605 Pain in left leg: Secondary | ICD-10-CM

## 2018-01-20 DIAGNOSIS — G8929 Other chronic pain: Secondary | ICD-10-CM

## 2018-01-20 DIAGNOSIS — M545 Low back pain, unspecified: Secondary | ICD-10-CM

## 2018-01-20 NOTE — Therapy (Signed)
[place], [place], [address] Phone: [phone]   Fax:  [phone]  Physical Therapy Treatment  Patient Details  Name: [name] MRN: [id number] Date of Birth: Aug 01, 1940 Referring Provider: Zollie Beckers MD   Encounter Date: 01/20/2018  PT End of Session - 01/20/18 1315    Visit Number  2    Number of Visits  17    Date for PT Re-Evaluation  03/10/18    Authorization Type  medicare progress note at 10th visit and Kx mod by 15th visit    PT Start Time  1150    PT Stop Time  1230    PT Time Calculation (min)  40 min    Activity Tolerance  Patient tolerated treatment well;Patient limited by pain    Behavior During Therapy  [facility] for tasks assessed/performed       Past Medical History:  Diagnosis Date  . Acute on chronic diastolic (congestive) heart failure ([place])   . Anemia    iron deficiency anemia - secondary to blood loss ( chronic)   . Arthritis    endstage changes bilateral knees/bilateral ankles.   . Asthma   . Carotid artery occlusion   . Chronic fatigue   . Chronic kidney disease   . Closed left hip fracture ([place])   . Clotting disorder ([place])    pt denies this  . Contusion of left knee    due to fall 1/14.  [name] COPD (chronic obstructive pulmonary disease) (HCC)    pulmonary fibrosis  . Depression, reactive   . Diabetes mellitus    type II   . Diastolic dysfunction   . Difficulty in walking   . Family history of heart disease   . Generalized muscle weakness   . Gout   . High cholesterol   . History of falling   . Hypertension   . Hypothyroidism   . Interstitial lung disease ([place])   . 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    . Morbid obesity ([place])   . Morbid obesity ([place])   . Neuromuscular disorder ([place])    diabetic neuropathy   . Normal coronary arteries    cardiac catheterization  performed  10/31/14  . RA (rheumatoid arthritis) ([place])    has been off methotreaxte since 10/13.  [name] Rheumatoid arthritis ([place])   . Shortness of breath   . Spinal stenosis of lumbar region   . Thyroid disease   . Unspecified lack of coordination   . URI (upper respiratory infection)     Past Surgical History:  Procedure Laterality Date  . ABDOMINAL HYSTERECTOMY    . BRAIN SURGERY     Gamma knife 10/13. Needs repeat spring  '14  . CARDIAC CATHETERIZATION N/A 10/31/2014   Procedure: Right/Left Heart Cath and Coronary Angiography;  Surgeon: [name], MD;  Location: [facility];  Service: Cardiovascular;  Laterality: N/A;  . ESOPHAGOGASTRODUODENOSCOPY (EGD) WITH PROPOFOL N/A 09/14/2014   Procedure: ESOPHAGOGASTRODUODENOSCOPY (EGD) WITH PROPOFOL;  Surgeon: [name], MD;  Location: [facility];  Service: Endoscopy;  Laterality: N/A;  . INCISION AND DRAINAGE HIP Left 01/16/2017   Procedure: IRRIGATION AND DEBRIDEMENT LEFT HIP;  Surgeon: [name], MD;  Location: [facility];  Service: Orthopedics;  Laterality: Left;  . INTRAMEDULLARY (IM) NAIL INTERTROCHANTERIC Left 11/29/2016   Procedure: INTRAMEDULLARY (IM) NAIL INTERTROCHANTRIC;  Surgeon: [name], MD;  Location: [facility];  Service: Orthopedics;  Laterality: Left;  . OVARY SURGERY    . SHOULDER SURGERY Left   . TONSILLECTOMY  age 77  . VIDEO BRONCHOSCOPY Bilateral 05/31/2013   Procedure: VIDEO BRONCHOSCOPY WITHOUT FLUORO;  Surgeon: Brand Males, MD;  Location: [facility];  Service: Cardiopulmonary;  Laterality: Bilateral;  . video bronscoscopy  2000   lung    There were no vitals filed for this visit.  Subjective Assessment - 01/20/18 1313    Subjective  Pt arriving to therapy amb with her rolling walker and O2 portable tank. Pt on 2L O2 Nasal Cannula. Pt reported left hip pain of 5/10.     Pertinent History  significant PMhx, currenlty on 2 LPM O2    Limitations   Lifting;Standing;Walking;House hold activities    How long can you stand comfortably?  with RW 5 min     How long can you walk comfortably?  with RW 5-10     Diagnostic tests  x-ray 8/21    Patient Stated Goals  to be able to walk, and move without pain, get back to using a SPC    Currently in Pain?  Yes    Pain Score  5     Pain Location  Hip    Pain Orientation  Left    Pain Descriptors / Indicators  Aching;Discomfort    Pain Type  Chronic pain    Pain Onset  More than a month ago    Pain Frequency  Intermittent                       [facility] Adult PT Treatment/Exercise - 01/20/18 0001      Exercises   Exercises  Knee/Hip      Knee/Hip Exercises: Stretches   Active Hamstring Stretch  Both;2 reps;30 seconds;Limitations    Active Hamstring Stretch Limitations  using a sheet to assist    Piriformis Stretch  Both;2 reps;30 seconds;Limitations    Piriformis Stretch Limitations  using a sheet to assist      Knee/Hip Exercises: Seated   Long Arc Quad  Both;10 reps    Ball Squeeze  x 15 holding 5 seconds each    Other Seated Knee/Hip Exercises  shoulder flexion with cane with deep breathing exercises to expand thoracic cavity, also shoulder extension/ pect stretch with deep breathing exercises, pt's o2 sats increased from 94-95% to 99% after exercises were performed.     Marching  Strengthening;Both;10 reps    Abduction/Adduction   Strengthening;10 reps;Limitations    Abd/Adduction Limitations  using red theraband    Sit to Sand  1 set;with UE support   3 reps using bilateral UE support     Knee/Hip Exercises: Supine   Heel Slides  AROM;Strengthening;Both;10 reps             PT Education - 01/20/18 1315    Education Details  deep breathing exercises with thoracic expansion    Person(s) Educated  Patient    Methods  Explanation;Demonstration;Verbal cues    Comprehension  Verbalized understanding;Returned demonstration       PT Short Term Goals - 01/20/18  1316      PT SHORT TERM GOAL #1   Title  pt to be I with initial HEP     Baseline  done today but pulse ox not working    Time  4    Period  Weeks    Status  New      PT  SHORT TERM GOAL #2   Title  pt to verbalize/ demo proper posture and gait mechanics to prevent and reduce hip and low back pain     Time  4    Period  Weeks    Status  New      PT SHORT TERM GOAL #3   Title  pt to be able to stand / ambulate >/= 10 with LRAD with </= 6/10 pain to promote therapeutic progression     Time  4    Period  Weeks        PT Long Term Goals - 01/13/18 1712      PT LONG TERM GOAL #1   Title  pt to increase bil Le strength to >/= 4/5 to promote hip/ knee stability and maximzie safety with prolonged standing/ walking activity    Time  8    Period  Weeks    Status  New    Target Date  03/10/18      PT LONG TERM GOAL #2   Title  pt to be able to ambulate >/= 30 min with LRAD reporting </= 4/10 pain for functional endurance for community and in home ambulation    Time  8    Period  Weeks    Status  New    Target Date  03/10/18      PT LONG TERM GOAL #3   Title  pt increase trunk mobility by >/= 10 degrees in flexion and sidebending to with </= 3/10 pain to assist with functional mobility required for ADL s    Time  8    Period  Weeks    Status  New    Target Date  03/10/18      PT LONG TERM GOAL #4   Title  incresae FOTO score to </= 60% limited to demo improvement in function    Time  8    Period  Weeks    Status  New    Target Date  03/10/18      PT LONG TERM GOAL #5   Title  pt to be I with all HEP given as of last visit to maintain and progress current level of function     Time  8    Period  Weeks    Status  New    Target Date  03/10/18              Patient will benefit from skilled therapeutic intervention in order to improve the following deficits and impairments:     Visit Diagnosis: Chronic bilateral low back pain without sciatica  Pain in left  leg  Other abnormalities of gait and mobility     Problem List Patient Active Problem List   Diagnosis Date Noted  . Pain in left hip 02/18/2017  . Wound infection after surgery, initial encounter 01/16/2017  . Closed nondisplaced intertrochanteric fracture of left femur with delayed healing 01/14/2017  . Postoperative wound infection 01/14/2017  . Gout 11/29/2016  . Depression 11/29/2016  . Closed left hip fracture ([place]) 11/29/2016  . GERD (gastroesophageal reflux disease) 11/29/2016  . Closed intertrochanteric fracture of hip, left, initial encounter ([place]) 11/29/2016  . Physical deconditioning 07/31/2015  . Pulmonary fibrosis, postinflammatory ([place]) 07/31/2015  . Tendinitis of left rotator cuff 06/13/2015  . Acute on chronic diastolic heart failure ([name]) 04/21/2015  . Acute pulmonary edema (HCC)   . AKI (acute kidney injury) ([place])   . Peripheral edema   . Acute on  chronic respiratory failure with hypoxia ([place]) 04/17/2015  . CAP (community acquired pneumonia) 04/17/2015  . Acute kidney injury superimposed on chronic kidney disease ([place]) 04/17/2015  . Hypotension 04/17/2015  . Chronic respiratory failure with hypoxia ([place]) 02/09/2015  . Dyspnea and respiratory abnormality 01/03/2015  . Normal coronary arteries 11/02/2014  . Morbid obesity ([place]) 10/30/2014  . Dysphagia 08/21/2014  . Sore throat 07/18/2014  . Chronic fatigue 06/21/2014  . DOE (dyspnea on exertion) 05/24/2014  . High risk medication use 04/17/2014  . Aphasia 02/12/2014  . 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 Lumbar facet arthropathy 01/18/2014  . Acute bronchitis 07/24/2013  . Bilateral edema of lower extremity 05/30/2013  . Chronic cough 05/24/2013  . ILD (interstitial lung disease) ([place]) 04/10/2013  . Greater trochanteric bursitis of left hip 08/13/2012  . Spinal stenosis of lumbar region with radiculopathy 07/12/2012  . Inability to walk 07/12/2012  . Acute lumbar back  pain 07/12/2012  . Asymptomatic bacteriuria 07/12/2012  . 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 ([place]) 02/17/2012  . Benign neoplasm of meninges ([place]) 02/12/2012  . Brain mass 01/28/2012  . Chest pain with moderate risk of acute coronary syndrome 01/28/2012  . Rheumatoid arthritis ([place]) 01/28/2012  . Primary osteoarthritis of right knee 01/28/2012  . Dyslipidemia   . Thyroid disease   . Essential hypertension     Oretha Caprice , MPT 01/20/2018, 1:17 PM  [facility] [address] [place], [place], [address] Phone: [phone]   Fax:  [phone]  Name: AITANNA HAUBNER MRN: [id number] Date of Birth: December 09, 1940

## 2018-01-26 ENCOUNTER — Ambulatory Visit: Payer: Medicare Other | Admitting: Physical Therapy

## 2018-01-27 ENCOUNTER — Ambulatory Visit: Payer: Medicare Other | Admitting: Physical Therapy

## 2018-01-27 VITALS — HR 100

## 2018-01-27 DIAGNOSIS — G8929 Other chronic pain: Secondary | ICD-10-CM

## 2018-01-27 DIAGNOSIS — R2689 Other abnormalities of gait and mobility: Secondary | ICD-10-CM | POA: Diagnosis not present

## 2018-01-27 DIAGNOSIS — M79605 Pain in left leg: Secondary | ICD-10-CM

## 2018-01-27 DIAGNOSIS — M545 Low back pain: Principal | ICD-10-CM

## 2018-01-27 NOTE — Therapy (Signed)
[place], [place], [address] Phone: [phone]   Fax:  [phone]  Physical Therapy Treatment  Patient Details  Name: [name] MRN: [id number] Date of Birth: February 17, 1941 Referring Provider: Zollie Beckers MD   Encounter Date: 01/27/2018  PT End of Session - 01/27/18 1234    Visit Number  3    Number of Visits  17    Date for PT Re-Evaluation  03/10/18    Authorization Type  medicare progress note at 10th visit and Kx mod by 15th visit    PT Start Time  1105    PT Stop Time  1145   last 10 min on heat   PT Time Calculation (min)  40 min    Activity Tolerance  Patient tolerated treatment well;Patient limited by fatigue    Behavior During Therapy  [facility] for tasks assessed/performed       Past Medical History:  Diagnosis Date  . Acute on chronic diastolic (congestive) heart failure ([place])   . Anemia    iron deficiency anemia - secondary to blood loss ( chronic)   . Arthritis    endstage changes bilateral knees/bilateral ankles.   . Asthma   . Carotid artery occlusion   . Chronic fatigue   . Chronic kidney disease   . Closed left hip fracture ([place])   . Clotting disorder ([place])    pt denies this  . Contusion of left knee    due to fall 1/14.  [name] COPD (chronic obstructive pulmonary disease) (HCC)    pulmonary fibrosis  . Depression, reactive   . Diabetes mellitus    type II   . Diastolic dysfunction   . Difficulty in walking   . Family history of heart disease   . Generalized muscle weakness   . Gout   . High cholesterol   . History of falling   . Hypertension   . Hypothyroidism   . Interstitial lung disease ([place])   . 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    . Morbid obesity ([place])   . Morbid obesity ([place])   . Neuromuscular disorder ([place])    diabetic neuropathy   . Normal coronary arteries     cardiac catheterization performed  10/31/14  . RA (rheumatoid arthritis) ([place])    has been off methotreaxte since 10/13.  [name] Rheumatoid arthritis ([place])   . Shortness of breath   . Spinal stenosis of lumbar region   . Thyroid disease   . Unspecified lack of coordination   . URI (upper respiratory infection)     Past Surgical History:  Procedure Laterality Date  . ABDOMINAL HYSTERECTOMY    . BRAIN SURGERY     Gamma knife 10/13. Needs repeat spring  '14  . CARDIAC CATHETERIZATION N/A 10/31/2014   Procedure: Right/Left Heart Cath and Coronary Angiography;  Surgeon: [name], MD;  Location: [place] [facility];  Service: Cardiovascular;  Laterality: N/A;  . ESOPHAGOGASTRODUODENOSCOPY (EGD) WITH PROPOFOL N/A 09/14/2014   Procedure: ESOPHAGOGASTRODUODENOSCOPY (EGD) WITH PROPOFOL;  Surgeon: [name], MD;  [facility];  Service: Endoscopy;  Laterality: N/A;  . INCISION AND DRAINAGE HIP Left 01/16/2017   Procedure: IRRIGATION AND DEBRIDEMENT LEFT HIP;  Surgeon: [name], MD;  [facility];  Service: Orthopedics;  Laterality: Left;  . INTRAMEDULLARY (IM) NAIL INTERTROCHANTERIC Left 11/29/2016   Procedure: INTRAMEDULLARY (IM) NAIL INTERTROCHANTRIC;  Surgeon: [name], MD;  Location: [place];  Service: Orthopedics;  Laterality: Left;  . OVARY SURGERY    . SHOULDER SURGERY Left   . TONSILLECTOMY  age 77  . VIDEO BRONCHOSCOPY Bilateral 05/31/2013   Procedure: VIDEO BRONCHOSCOPY WITHOUT FLUORO;  Surgeon: Brand Males, MD;  Location: [place];  Service: Cardiopulmonary;  Laterality: Bilateral;  . video bronscoscopy  2000   lung    Vitals:   01/27/18 1140  Pulse: 100  SpO2: 96%    Subjective Assessment - 01/27/18 1140    Subjective  Pt relays she is doing better and getting better, she also forgot her O2 today.    Currently in Pain?  Yes    Pain Score  4     Pain Location  Hip    Pain Orientation  Left    Pain Descriptors /  Indicators  Discomfort                       [facility] Adult PT Treatment/Exercise - 01/27/18 0001      Exercises   Exercises  Knee/Hip      Knee/Hip Exercises: Stretches   Active Hamstring Stretch  Both;2 reps;30 seconds;Limitations    Piriformis Stretch  Both;2 reps;30 seconds;Limitations    Other Knee/Hip Stretches  LTR 5 sec X 10 bilat    Other Knee/Hip Stretches  sitting P-all roll out for lumbar flexion 5 sec X 10      Knee/Hip Exercises: Seated   Long Arc Quad  Both;20 reps    Ball Squeeze  x 15 holding 5 seconds each    Other Seated Knee/Hip Exercises  shoulder flexion with cane with deep breathing exercises to expand thoracic cavity, also shoulder extension/ pect stretch with deep breathing exercises    Marching  Strengthening;Both;2 sets;10 reps      Modalities   Modalities  Moist Heat      Moist Heat Therapy   Number Minutes Moist Heat  10 Minutes    Moist Heat Location  Lumbar Spine;Hip               PT Short Term Goals - 01/20/18 1316      PT SHORT TERM GOAL #1   Title  pt to be I with initial HEP     Baseline  done today but pulse ox not working    Time  4    Period  Weeks    Status  New      PT SHORT TERM GOAL #2   Title  pt to verbalize/ demo proper posture and gait mechanics to prevent and reduce hip and low back pain     Time  4    Period  Weeks    Status  New      PT SHORT TERM GOAL #3   Title  pt to be able to stand / ambulate >/= 10 with LRAD with </= 6/10 pain to promote therapeutic progression     Time  4    Period  Weeks        PT Long Term Goals - 01/13/18 1712      PT LONG TERM GOAL #1   Title  pt to increase bil Le strength to >/= 4/5 to promote hip/ knee stability and maximzie safety with prolonged standing/ walking activity    Time  8    Period  Weeks    Status  New    Target Date  03/10/18  PT LONG TERM GOAL #2   Title  pt to be able to ambulate >/= 30 min with LRAD reporting </= 4/10 pain for  functional endurance for community and in home ambulation    Time  8    Period  Weeks    Status  New    Target Date  03/10/18      PT LONG TERM GOAL #3   Title  pt increase trunk mobility by >/= 10 degrees in flexion and sidebending to with </= 3/10 pain to assist with functional mobility required for ADL s    Time  8    Period  Weeks    Status  New    Target Date  03/10/18      PT LONG TERM GOAL #4   Title  incresae FOTO score to </= 60% limited to demo improvement in function    Time  8    Period  Weeks    Status  New    Target Date  03/10/18      PT LONG TERM GOAL #5   Title  pt to be I with all HEP given as of last visit to maintain and progress current level of function     Time  8    Period  Weeks    Status  New    Target Date  03/10/18            Plan - 01/27/18 1235    Clinical Impression Statement  Pt forgot her supplemental O2 today so some therex was held and O2 sat was monitored and it did not drop below 93%. Session focused on stretching and light strengthening with less pain today and better tolerance. She also had less pain after heat at end of session. PT will progress as able.    Rehab Potential  Good    PT Frequency  2x / week    PT Treatment/Interventions  ADLs/Self Care Home Management;Cryotherapy;Electrical Stimulation;Iontophoresis 81m/ml Dexamethasone;Moist Heat;Ultrasound;Balance training;Stair training;Functional mobility training;Therapeutic activities;Therapeutic exercise;Manual techniques;Taping;Dry needling;Gait training    PT Next Visit Plan  review/ update HEP PRN, assess hip ROM, hamstring stretching gently, core activation, gait trainining and endurance    PT Home Exercise Plan  lower trunk rotation, gentle hamstring stretching (sitting and supine), posterior pelvic tilt.     Consulted and Agree with Plan of Care  Patient       Patient will benefit from skilled therapeutic intervention in order to improve the following deficits and  impairments:  Abnormal gait, Pain, Decreased activity tolerance, Decreased balance, Postural dysfunction, Improper body mechanics, Decreased strength  Visit Diagnosis: Chronic bilateral low back pain without sciatica  Pain in left leg  Other abnormalities of gait and mobility     Problem List Patient Active Problem List   Diagnosis Date Noted  . Pain in left hip 02/18/2017  . Wound infection after surgery, initial encounter 01/16/2017  . Closed nondisplaced intertrochanteric fracture of left femur with delayed healing 01/14/2017  . Postoperative wound infection 01/14/2017  . Gout 11/29/2016  . Depression 11/29/2016  . Closed left hip fracture ([place] 11/29/2016  . GERD (gastroesophageal reflux disease) 11/29/2016  . Closed intertrochanteric fracture of hip, left, initial encounter ([place] 11/29/2016  . Physical deconditioning 07/31/2015  . Pulmonary fibrosis, postinflammatory ([place] 07/31/2015  . Tendinitis of left rotator cuff 06/13/2015  . Acute on chronic diastolic heart failure ([place] 04/21/2015  . Acute pulmonary edema (HCC)   . AKI (acute kidney injury) ([place]   . Peripheral  edema   . Acute on chronic respiratory failure with hypoxia ([place]) 04/17/2015  . CAP (community acquired pneumonia) 04/17/2015  . Acute kidney injury superimposed on chronic kidney disease ([place]) 04/17/2015  . Hypotension 04/17/2015  . Chronic respiratory failure with hypoxia ([place]) 02/09/2015  . Dyspnea and respiratory abnormality 01/03/2015  . Normal coronary arteries 11/02/2014  . Morbid obesity ([place]) 10/30/2014  . Dysphagia 08/21/2014  . Sore throat 07/18/2014  . Chronic fatigue 06/21/2014  . DOE (dyspnea on exertion) 05/24/2014  . High risk medication use 04/17/2014  . Aphasia 02/12/2014  . 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 ([place]) 01/18/2014  . Lumbar facet arthropathy 01/18/2014  . Acute bronchitis 07/24/2013  . Bilateral edema of lower extremity 05/30/2013  . Chronic cough  05/24/2013  . ILD (interstitial lung disease) ([place]) 04/10/2013  . Greater trochanteric bursitis of left hip 08/13/2012  . Spinal stenosis of lumbar region with radiculopathy 07/12/2012  . Inability to walk 07/12/2012  . Acute lumbar back pain 07/12/2012  . Asymptomatic bacteriuria 07/12/2012  . 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 ([place]) 02/17/2012  . Benign neoplasm of meninges ([place]) 02/12/2012  . Brain mass 01/28/2012  . Chest pain with moderate risk of acute coronary syndrome 01/28/2012  . Rheumatoid arthritis ([place]) 01/28/2012  . Primary osteoarthritis of right knee 01/28/2012  . Dyslipidemia   . Thyroid disease   . Essential hypertension     Debbe Odea, PT, DPT 01/27/2018, 12:38 PM  [facility] [address] [place], [place], [address] Phone: [phone]   Fax:  [phone]  Name: DORTHEA MAINA MRN: [id number] Date of Birth: 1940/10/19

## 2018-01-29 ENCOUNTER — Encounter (HOSPITAL_COMMUNITY): Payer: Self-pay | Admitting: Emergency Medicine

## 2018-01-29 ENCOUNTER — Ambulatory Visit (HOSPITAL_COMMUNITY)
Admission: EM | Admit: 2018-01-29 | Discharge: 2018-01-29 | Disposition: A | Discharge status: Home / Self Care | Payer: Medicare Other | Attending: Nurse Practitioner | Admitting: Nurse Practitioner

## 2018-01-29 ENCOUNTER — Ambulatory Visit: Payer: Medicare Other | Admitting: Physical Therapy

## 2018-01-29 DIAGNOSIS — J0100 Acute maxillary sinusitis, unspecified: Secondary | ICD-10-CM | POA: Diagnosis not present

## 2018-01-29 MED ORDER — PREDNISONE 20 MG PO TABS: ORAL_TABLET | ORAL | 0 refills | Status: DC

## 2018-01-29 MED ORDER — AMOXICILLIN-POT CLAVULANATE 875-125 MG PO TABS: 1.0000 | ORAL_TABLET | Freq: Two times a day (BID) | ORAL | 0 refills | Status: DC

## 2018-01-29 NOTE — ED Triage Notes (Signed)
Pt sts URI sx x 3 days

## 2018-01-29 NOTE — Discharge Instructions (Signed)
It was a pleasure meeting you today! Take medications as prescribed. Continue the mucinex. May take tylenol or ibuprofen as needed for headache/fevers. Stop your normal dose of prednisone and take as prescribed today then resume your normal dosing.  Hope you feel better!

## 2018-02-02 ENCOUNTER — Encounter: Payer: Self-pay | Admitting: Physical Therapy

## 2018-02-02 ENCOUNTER — Ambulatory Visit: Payer: Medicare Other | Admitting: Physical Therapy

## 2018-02-02 DIAGNOSIS — M545 Low back pain: Secondary | ICD-10-CM | POA: Diagnosis not present

## 2018-02-02 DIAGNOSIS — G8929 Other chronic pain: Secondary | ICD-10-CM

## 2018-02-02 DIAGNOSIS — M79605 Pain in left leg: Secondary | ICD-10-CM | POA: Diagnosis not present

## 2018-02-02 DIAGNOSIS — R2689 Other abnormalities of gait and mobility: Secondary | ICD-10-CM

## 2018-02-02 NOTE — Therapy (Signed)
[place], [place], [address] Phone: [phone]   Fax:  [phone]  Physical Therapy Treatment  Patient Details  Name: [name] MRN: [id number] Date of Birth: 27-Sep-1940 Referring Provider: Zollie Beckers MD   Encounter Date: 02/02/2018  PT End of Session - 02/02/18 1329    Visit Number  4    Number of Visits  17    Date for PT Re-Evaluation  03/10/18    Authorization Type  medicare progress note at 10th visit and Kx mod by 15th visit    PT Start Time  1330    PT Stop Time  1414    PT Time Calculation (min)  44 min    Activity Tolerance  Patient tolerated treatment well;Patient limited by fatigue    Behavior During Therapy  [facility] for tasks assessed/performed       Past Medical History:  Diagnosis Date  . Acute on chronic diastolic (congestive) heart failure ([place])   . Anemia    iron deficiency anemia - secondary to blood loss ( chronic)   . Arthritis    endstage changes bilateral knees/bilateral ankles.   . Asthma   . Carotid artery occlusion   . Chronic fatigue   . Chronic kidney disease   . Closed left hip fracture ([place])   . Clotting disorder ([place])    pt denies this  . Contusion of left knee    due to fall 1/14.  [name] COPD (chronic obstructive pulmonary disease) (HCC)    pulmonary fibrosis  . Depression, reactive   . Diabetes mellitus    type II   . Diastolic dysfunction   . Difficulty in walking   . Family history of heart disease   . Generalized muscle weakness   . Gout   . High cholesterol   . History of falling   . Hypertension   . Hypothyroidism   . Interstitial lung disease ([place])   . 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    . Morbid obesity ([place])   . Morbid obesity ([place])   . Neuromuscular disorder ([place])    diabetic neuropathy   . Normal coronary arteries    cardiac  catheterization performed  10/31/14  . RA (rheumatoid arthritis) ([place])    has been off methotreaxte since 10/13.  [name] Rheumatoid arthritis ([place])   . Shortness of breath   . Spinal stenosis of lumbar region   . Thyroid disease   . Unspecified lack of coordination   . URI (upper respiratory infection)     Past Surgical History:  Procedure Laterality Date  . ABDOMINAL HYSTERECTOMY    . BRAIN SURGERY     Gamma knife 10/13. Needs repeat spring  '14  . CARDIAC CATHETERIZATION N/A 10/31/2014   Procedure: Right/Left Heart Cath and Coronary Angiography;  Surgeon: [name], MD;  Location: [place] [facility];  Service: Cardiovascular;  Laterality: N/A;  . ESOPHAGOGASTRODUODENOSCOPY (EGD) WITH PROPOFOL N/A 09/14/2014   Procedure: ESOPHAGOGASTRODUODENOSCOPY (EGD) WITH PROPOFOL;  Surgeon: [name], MD;  [facility];  Service: Endoscopy;  Laterality: N/A;  . INCISION AND DRAINAGE HIP Left 01/16/2017   Procedure: IRRIGATION AND DEBRIDEMENT LEFT HIP;  Surgeon: [name], MD;  [facility];  Service: Orthopedics;  Laterality: Left;  . INTRAMEDULLARY (IM) NAIL INTERTROCHANTERIC Left 11/29/2016   Procedure: INTRAMEDULLARY (IM) NAIL INTERTROCHANTRIC;  Surgeon: [name], MD;  Location: [place];  Service: Orthopedics;  Laterality: Left;  . OVARY SURGERY    . SHOULDER SURGERY Left   . TONSILLECTOMY  age 56  . VIDEO BRONCHOSCOPY Bilateral 05/31/2013   Procedure: VIDEO BRONCHOSCOPY WITHOUT FLUORO;  Surgeon: Brand Males, MD;  Location: [place];  Service: Cardiopulmonary;  Laterality: Bilateral;  . video bronscoscopy  2000   lung    There were no vitals filed for this visit.  Subjective Assessment - 02/02/18 1329    Subjective  "I am only having pain only in the L low back, the leg has gotten much better"    Currently in Pain?  Yes    Pain Score  4     Pain Location  Back    Pain Orientation  Left    Pain Descriptors / Indicators  Sore     Pain Type  Chronic pain    Pain Onset  More than a month ago    Pain Frequency  Intermittent    Aggravating Factors   prolonged standing/ walking, bending forward    Pain Relieving Factors  resting, medication, no    Pain Orientation  Right;Left                       [facility] Adult PT Treatment/Exercise - 02/02/18 1346      Therapeutic Activites    Therapeutic Activities  Other Therapeutic Activities    Other Therapeutic Activities  log rolling and bed mobility which she required frequently verbal/ tactile cues fro proper form      Exercises   Exercises  Knee/Hip      Lumbar Exercises: Stretches   Lower Trunk Rotation Limitations  2 x 10       Lumbar Exercises: Supine   Bent Knee Raise  20 reps   x 2 keeping core tight throughout session     Knee/Hip Exercises: Stretches   Active Hamstring Stretch  30 seconds;3 reps   PNF contract/ relax 5 sec hold     Knee/Hip Exercises: Seated   Marching  Strengthening;Both;2 sets;10 reps   cues for counting out loud for breathing   Abduction/Adduction   Strengthening;2 sets;20 reps   green theraband abduction     Knee/Hip Exercises: Supine   Straight Leg Raises  2 sets;10 reps;Left;Strengthening    Other Supine Knee/Hip Exercises  posterior pelvic tilt 2 x10 hlding 5 sec. cues to count out loud during hold to prevent pt from holding breath throughout exercise      Manual Therapy   Manual Therapy  Joint mobilization    Joint Mobilization  LAD on the LLE grade 5             PT Education - 02/02/18 1419    Education Details  focusing on counting during exercise to prevent holding breathing and updated HEP for supine marching    Person(s) Educated  Patient    Methods  Explanation;Verbal cues;Handout    Comprehension  Verbalized understanding;Verbal cues required       PT Short Term Goals - 01/20/18 1316      PT SHORT TERM GOAL #1   Title  pt to be I with initial HEP     Baseline  done today but pulse ox not  working    Time  4    Period  Weeks    Status  New      PT SHORT TERM GOAL #2   Title  pt to verbalize/ demo proper posture  and gait mechanics to prevent and reduce hip and low back pain     Time  4    Period  Weeks    Status  New      PT SHORT TERM GOAL #3   Title  pt to be able to stand / ambulate >/= 10 with LRAD with </= 6/10 pain to promote therapeutic progression     Time  4    Period  Weeks        PT Long Term Goals - 01/13/18 1712      PT LONG TERM GOAL #1   Title  pt to increase bil Le strength to >/= 4/5 to promote hip/ knee stability and maximzie safety with prolonged standing/ walking activity    Time  8    Period  Weeks    Status  New    Target Date  03/10/18      PT LONG TERM GOAL #2   Title  pt to be able to ambulate >/= 30 min with LRAD reporting </= 4/10 pain for functional endurance for community and in home ambulation    Time  8    Period  Weeks    Status  New    Target Date  03/10/18      PT LONG TERM GOAL #3   Title  pt increase trunk mobility by >/= 10 degrees in flexion and sidebending to with </= 3/10 pain to assist with functional mobility required for ADL s    Time  8    Period  Weeks    Status  New    Target Date  03/10/18      PT LONG TERM GOAL #4   Title  incresae FOTO score to </= 60% limited to demo improvement in function    Time  8    Period  Weeks    Status  New    Target Date  03/10/18      PT LONG TERM GOAL #5   Title  pt to be I with all HEP given as of last visit to maintain and progress current level of function     Time  8    Period  Weeks    Status  New    Target Date  03/10/18            Plan - 02/02/18 1330    Clinical Impression Statement  pt did not bring her oxygen today due to a part being broken, She stayed around 95% throughout the entire session today. continued hip strenghtening following hip LAD mobs to promote hip flexor muscle activation which she performed well. practiced bed mobility which she had  required verbal cues for proper form, and had difficulty with. no pain noted end of session.     PT Next Visit Plan  potentilal SIJ involvement on L, hamstring stretching gently, core activation, gait trainining and endurance, hip strengtheing, LAD to promote hip flexor activation.     PT Home Exercise Plan  lower trunk rotation, gentle hamstring stretching (sitting and supine), posterior pelvic tilt. supine marching.     Consulted and Agree with Plan of Care  Patient       Patient will benefit from skilled therapeutic intervention in order to improve the following deficits and impairments:  Abnormal gait, Pain, Decreased activity tolerance, Decreased balance, Postural dysfunction, Improper body mechanics, Decreased strength  Visit Diagnosis: Chronic bilateral low back pain without sciatica  Pain in left leg  Other abnormalities of  gait and mobility     Problem List Patient Active Problem List   Diagnosis Date Noted  . Pain in left hip 02/18/2017  . Wound infection after surgery, initial encounter 01/16/2017  . Closed nondisplaced intertrochanteric fracture of left femur with delayed healing 01/14/2017  . Postoperative wound infection 01/14/2017  . Gout 11/29/2016  . Depression 11/29/2016  . Closed left hip fracture ([place]) 11/29/2016  . GERD (gastroesophageal reflux disease) 11/29/2016  . Closed intertrochanteric fracture of hip, left, initial encounter ([place]) 11/29/2016  . Physical deconditioning 07/31/2015  . Pulmonary fibrosis, postinflammatory ([place]) 07/31/2015  . Tendinitis of left rotator cuff 06/13/2015  . Acute on chronic diastolic heart failure ([place]) 04/21/2015  . Acute pulmonary edema (HCC)   . AKI (acute kidney injury) ([place])   . Peripheral edema   . Acute on chronic respiratory failure with hypoxia ([place]) 04/17/2015  . CAP (community acquired pneumonia) 04/17/2015  . Acute kidney injury superimposed on chronic kidney disease ([place]) 04/17/2015  . Hypotension 04/17/2015   . Chronic respiratory failure with hypoxia ([place]) 02/09/2015  . Dyspnea and respiratory abnormality 01/03/2015  . Normal coronary arteries 11/02/2014  . Morbid obesity ([place]) 10/30/2014  . Dysphagia 08/21/2014  . Sore throat 07/18/2014  . Chronic fatigue 06/21/2014  . DOE (dyspnea on exertion) 05/24/2014  . High risk medication use 04/17/2014  . Aphasia 02/12/2014  . 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 Lumbar facet arthropathy 01/18/2014  . Acute bronchitis 07/24/2013  . Bilateral edema of lower extremity 05/30/2013  . Chronic cough 05/24/2013  . ILD (interstitial lung disease) ([place]) 04/10/2013  . Greater trochanteric bursitis of left hip 08/13/2012  . Spinal stenosis of lumbar region with radiculopathy 07/12/2012  . Inability to walk 07/12/2012  . Acute lumbar back pain 07/12/2012  . Asymptomatic bacteriuria 07/12/2012  . 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 ([place]) 02/17/2012  . Benign neoplasm of meninges ([place]) 02/12/2012  . Brain mass 01/28/2012  . Chest pain with moderate risk of acute coronary syndrome 01/28/2012  . Rheumatoid arthritis ([place]) 01/28/2012  . Primary osteoarthritis of right knee 01/28/2012  . Dyslipidemia   . Thyroid disease   . Essential hypertension    [place]arr [place] PT, DPT, LAT, ATC  02/02/18  2:23 PM      [place] [facility] [address] [place], [place], [address] Phone: [phone]   Fax:  [phone]  Name: LATOYA MAULDING MRN: [id number] Date of Birth: 20-Jan-1941

## 2018-02-03 DIAGNOSIS — Z79899 Other long term (current) drug therapy: Secondary | ICD-10-CM | POA: Diagnosis not present

## 2018-02-03 DIAGNOSIS — M0579 Rheumatoid arthritis with rheumatoid factor of multiple sites without organ or systems involvement: Secondary | ICD-10-CM | POA: Diagnosis not present

## 2018-02-05 ENCOUNTER — Ambulatory Visit: Payer: Medicare Other | Admitting: Physical Therapy

## 2018-02-05 ENCOUNTER — Encounter: Payer: Self-pay | Admitting: Physical Therapy

## 2018-02-05 VITALS — HR 93

## 2018-02-05 DIAGNOSIS — G8929 Other chronic pain: Secondary | ICD-10-CM

## 2018-02-05 DIAGNOSIS — M79605 Pain in left leg: Secondary | ICD-10-CM

## 2018-02-05 DIAGNOSIS — M545 Low back pain: Principal | ICD-10-CM

## 2018-02-05 DIAGNOSIS — R2689 Other abnormalities of gait and mobility: Secondary | ICD-10-CM | POA: Diagnosis not present

## 2018-02-05 NOTE — Therapy (Signed)
[facility] [place], [place], [address] Phone: [phone]   Fax:  [phone]  Physical Therapy Treatment  Patient Details  Name: [name] MRN: [id number] Date of Birth: July 08, 1940 Referring Provider: Zollie Beckers MD   Encounter Date: 02/05/2018  PT End of Session - 02/05/18 1154    Visit Number  5    Number of Visits  17    Date for PT Re-Evaluation  03/10/18    Authorization Type  medicare progress note at 10th visit and Kx mod by 15th visit    PT Start Time  1102    PT Stop Time  1148    PT Time Calculation (min)  46 min    Activity Tolerance  Patient tolerated treatment well    Behavior During Therapy  [facility] for tasks assessed/performed       Past Medical History:  Diagnosis Date  . Acute on chronic diastolic (congestive) heart failure ([place])   . Anemia    iron deficiency anemia - secondary to blood loss ( chronic)   . Arthritis    endstage changes bilateral knees/bilateral ankles.   . Asthma   . Carotid artery occlusion   . Chronic fatigue   . Chronic kidney disease   . Closed left hip fracture ([place])   . Clotting disorder ([place])    pt denies this  . Contusion of left knee    due to fall 1/14.  [name] COPD (chronic obstructive pulmonary disease) (HCC)    pulmonary fibrosis  . Depression, reactive   . Diabetes mellitus    type II   . Diastolic dysfunction   . Difficulty in walking   . Family history of heart disease   . Generalized muscle weakness   . Gout   . High cholesterol   . History of falling   . Hypertension   . Hypothyroidism   . Interstitial lung disease ([place])   . 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    . Morbid obesity ([place])   . Morbid obesity ([place])   . Neuromuscular disorder ([place])    diabetic neuropathy   . Normal coronary arteries    cardiac catheterization performed  10/31/14  .  RA (rheumatoid arthritis) ([place])    has been off methotreaxte since 10/13.  [name] Rheumatoid arthritis ([place])   . Shortness of breath   . Spinal stenosis of lumbar region   . Thyroid disease   . Unspecified lack of coordination   . URI (upper respiratory infection)     Past Surgical History:  Procedure Laterality Date  . ABDOMINAL HYSTERECTOMY    . BRAIN SURGERY     Gamma knife 10/13. Needs repeat spring  '14  . CARDIAC CATHETERIZATION N/A 10/31/2014   Procedure: Right/Left Heart Cath and Coronary Angiography;  Surgeon: [name], MD;  Location: [facility];  Service: Cardiovascular;  Laterality: N/A;  . ESOPHAGOGASTRODUODENOSCOPY (EGD) WITH PROPOFOL N/A 09/14/2014   Procedure: ESOPHAGOGASTRODUODENOSCOPY (EGD) WITH PROPOFOL;  Surgeon: [name], MD;  Location: [facility];  Service: Endoscopy;  Laterality: N/A;  . INCISION AND DRAINAGE HIP Left 01/16/2017   Procedure: IRRIGATION AND DEBRIDEMENT LEFT HIP;  Surgeon: [name], MD;  Location: [facility];  Service: Orthopedics;  Laterality: Left;  . INTRAMEDULLARY (IM) NAIL INTERTROCHANTERIC Left 11/29/2016   Procedure: INTRAMEDULLARY (IM) NAIL INTERTROCHANTRIC;  Surgeon: [name], MD;  Location: [facility]  OR;  Service: Orthopedics;  Laterality: Left;  . OVARY SURGERY    . SHOULDER SURGERY Left   . TONSILLECTOMY  age 77  . VIDEO BRONCHOSCOPY Bilateral 05/31/2013   Procedure: VIDEO BRONCHOSCOPY WITHOUT FLUORO;  Surgeon: Brand Males, MD;  Location: [facility];  Service: Cardiopulmonary;  Laterality: Bilateral;  . video bronscoscopy  2000   lung    Vitals:   02/05/18 1108  Pulse: 93  SpO2: 97%    Subjective Assessment - 02/05/18 1108    Subjective  " I'm in a lot of pain today, after the trip to [place] my R leg is giving me isssues, and I am feeling more restless and feeling a bit anxious"     Currently in Pain?  Yes    Pain Score  4    was a 5/10 earlier   Pain Location  Back    Pain  Orientation  Left    Pain Descriptors / Indicators  Sore    Pain Type  Chronic pain    Pain Onset  More than a month ago    Pain Frequency  Intermittent                       [facility] Adult PT Treatment/Exercise - 02/05/18 0001      Exercises   Exercises  Knee/Hip;Lumbar      Lumbar Exercises: Standing   Other Standing Lumbar Exercises  pressing down through bil UE with physioball 2 x 10 with breathing out during pressing down and in with relaxing with extension      Lumbar Exercises: Supine   Bent Knee Raise  20 reps      Knee/Hip Exercises: Stretches   Other Knee/Hip Stretches  seated low back stretching with hands on stool for low back stretch      Knee/Hip Exercises: Aerobic   Nustep  L5 x 5 min   bil UE/LE     Knee/Hip Exercises: Seated   Long Arc Quad  Both;2 sets;20 reps;Strengthening    Marching  Strengthening;2 sets;20 reps;Both   tactile cues for sutained height    Sit to Sand  1 set;10 reps;without UE support   without UE support     Manual Therapy   Joint Mobilization  LAD on the LLE grade 4               PT Short Term Goals - 01/20/18 1316      PT SHORT TERM GOAL #1   Title  pt to be I with initial HEP     Baseline  done today but pulse ox not working    Time  4    Period  Weeks    Status  New      PT SHORT TERM GOAL #2   Title  pt to verbalize/ demo proper posture and gait mechanics to prevent and reduce hip and low back pain     Time  4    Period  Weeks    Status  New      PT SHORT TERM GOAL #3   Title  pt to be able to stand / ambulate >/= 10 with LRAD with </= 6/10 pain to promote therapeutic progression     Time  4    Period  Weeks        PT Long Term Goals - 01/13/18 1712      PT LONG TERM GOAL #1   Title  pt to increase bil Le  strength to >/= 4/5 to promote hip/ knee stability and maximzie safety with prolonged standing/ walking activity    Time  8    Period  Weeks    Status  New    Target Date  03/10/18       PT LONG TERM GOAL #2   Title  pt to be able to ambulate >/= 30 min with LRAD reporting </= 4/10 pain for functional endurance for community and in home ambulation    Time  8    Period  Weeks    Status  New    Target Date  03/10/18      PT LONG TERM GOAL #3   Title  pt increase trunk mobility by >/= 10 degrees in flexion and sidebending to with </= 3/10 pain to assist with functional mobility required for ADL s    Time  8    Period  Weeks    Status  New    Target Date  03/10/18      PT LONG TERM GOAL #4   Title  incresae FOTO score to </= 60% limited to demo improvement in function    Time  8    Period  Weeks    Status  New    Target Date  03/10/18      PT LONG TERM GOAL #5   Title  pt to be I with all HEP given as of last visit to maintain and progress current level of function     Time  8    Period  Weeks    Status  New    Target Date  03/10/18            Plan - 02/05/18 1155    Clinical Impression Statement  pt reported increased soreness inthe low back and R knee following her visit to [place]. she brought in her oxygen today which she remained around 97-98% throughout the entire session. continued working on core strengthening and hip/ knee strength which she perofrmed well. end of session she reported no pain.     PT Treatment/Interventions  ADLs/Self Care Home Management;Cryotherapy;Electrical Stimulation;Iontophoresis 67m/ml Dexamethasone;Moist Heat;Ultrasound;Balance training;Stair training;Functional mobility training;Therapeutic activities;Therapeutic exercise;Manual techniques;Taping;Dry needling;Gait training    PT Next Visit Plan  potentilal SIJ involvement on L, hamstring stretching gently, core activation, gait trainining and endurance, hip strengtheing, LAD to promote hip flexor activation.     PT Home Exercise Plan  lower trunk rotation, gentle hamstring stretching (sitting and supine), posterior pelvic tilt. supine marching.     Consulted and Agree with Plan  of Care  Patient       Patient will benefit from skilled therapeutic intervention in order to improve the following deficits and impairments:  Abnormal gait, Pain, Decreased activity tolerance, Decreased balance, Postural dysfunction, Improper body mechanics, Decreased strength  Visit Diagnosis: Chronic bilateral low back pain without sciatica  Pain in left leg  Other abnormalities of gait and mobility     Problem List Patient Active Problem List   Diagnosis Date Noted  . Pain in left hip 02/18/2017  . Wound infection after surgery, initial encounter 01/16/2017  . Closed nondisplaced intertrochanteric fracture of left femur with delayed healing 01/14/2017  . Postoperative wound infection 01/14/2017  . Gout 11/29/2016  . Depression 11/29/2016  . Closed left hip fracture ([place] 11/29/2016  . GERD (gastroesophageal reflux disease) 11/29/2016  . Closed [place]trochanteric fracture of hip, left, [place] 11/29/2016  . Physical deconditioning 07/31/2015  . Pulmonary fibrosis, postinflammatory (  [place]) 07/31/2015  . Tendinitis of left rotator cuff 06/13/2015  . Acute on chronic diastolic heart failure ([place]) 04/21/2015  . Acute pulmonary edema (HCC)   . AKI (acute kidney injury) ([place])   . Peripheral edema   . Acute on chronic respiratory failure with hypoxia ([place]) 04/17/2015  . CAP (community acquired pneumonia) 04/17/2015  . Acute kidney injury superimposed on chronic kidney disease ([place]) 04/17/2015  . Hypotension 04/17/2015  . Chronic respiratory failure with hypoxia ([place]) 02/09/2015  . Dyspnea and respiratory abnormality 01/03/2015  . Normal coronary arteries 11/02/2014  . Morbid obesity ([place]) 10/30/2014  . Dysphagia 08/21/2014  . Sore throat 07/18/2014  . Chronic fatigue 06/21/2014  . DOE (dyspnea on exertion) 05/24/2014  . High risk medication use 04/17/2014  . Aphasia 02/12/2014  . 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 Lumbar  facet arthropathy 01/18/2014  . Acute bronchitis 07/24/2013  . Bilateral edema of lower extremity 05/30/2013  . Chronic cough 05/24/2013  . ILD (interstitial lung disease) ([place]) 04/10/2013  . Greater trochanteric bursitis of left hip 08/13/2012  . Spinal stenosis of lumbar region with radiculopathy 07/12/2012  . Inability to walk 07/12/2012  . Acute lumbar back pain 07/12/2012  . Asymptomatic bacteriuria 07/12/2012  . 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 ([place]) 02/17/2012  . Benign neoplasm of meninges ([place]) 02/12/2012  . Brain mass 01/28/2012  . Chest pain with moderate risk of acute coronary syndrome 01/28/2012  . Rheumatoid arthritis ([place]) 01/28/2012  . Primary osteoarthritis of right knee 01/28/2012  . Dyslipidemia   . Thyroid disease   . Essential hypertension    Starr [place] PT, DPT, LAT, ATC  02/05/18  11:59 AM      [facility] [address] [place], [place], [address] Phone: [phone]   Fax:  [phone]  Name: [name] MRN: [id number] Date of Birth: 1940/11/21

## 2018-02-09 ENCOUNTER — Other Ambulatory Visit: Payer: Medicare Other | Admitting: Licensed Clinical Social Worker

## 2018-02-09 DIAGNOSIS — Z515 Encounter for palliative care: Secondary | ICD-10-CM

## 2018-02-10 NOTE — Progress Notes (Signed)
COMMUNITY PALLIATIVE CARE SW NOTE  PATIENT NAME: [name] DOB: 1941-01-05 MRN: [id number]  PRIMARY CARE PROVIDER: Glendale Chard, MD  RESPONSIBLE PARTY:  Acct ID - Guarantor Home Phone Work Phone Relationship Acct Type  [id number] Joan, [name] Self P/F     [address], [name], [place] [address]     PLAN OF CARE and INTERVENTIONS:             1. GOALS OF CARE/ ADVANCE CARE PLANNING:  Patient wishes to remain at home with her husband.  She is a DNR. 2. SOCIAL/EMOTIONAL/SPIRITUAL ASSESSMENT/ INTERVENTIONS:  SW met with patient in her home.  She was alert and oriented x3.  She denied pain during the visit and stated she has been decreasing the use of her pain medication.  Patient has been going to outpatient physical therapy about twice a week, and patient reports feeling physically better.  She did express tightness in her lungs and stated she was going to make an appointment with her PCP.  SW provided active listening and supportive counseling while patient discussed planning a birthday party for her husband.  She also talked about the death of her daughter and a longtime friend. 3. PATIENT/CAREGIVER EDUCATION/ COPING:  Patient copes by expressing her feelings openly with SW, but states she tries to appear strong for her husband and children since her daughter's death last Sep 04, 2022. 4. PERSONAL EMERGENCY PLAN:  Patient keeps her cell phone with her and informs one of her children with medical issues. 5. COMMUNITY RESOURCES COORDINATION/ HEALTH CARE NAVIGATION:  Patient's son lives with her and provides cooking and cleaning.  His daughter had a baby last month and is also living with them. 6. FINANCIAL/LEGAL CONCERNS/INTERVENTIONS:  None per patient.     SOCIAL HX:  Social History   Tobacco Use  . Smoking status: Never Smoker  . Smokeless tobacco: Never Used  Substance Use Topics  . Alcohol use: No    CODE STATUS:  DNR ADVANCED DIRECTIVES: N MOST  FORM COMPLETE:  N HOSPICE EDUCATION PROVIDED:  N PPS: Patient reports her appetite is normal.  Patient stands independently and ambulates with a walker. Duration of visit and documentation:  75 minutes.      Creola Corn Ayan [place], LCSW

## 2018-02-11 ENCOUNTER — Ambulatory Visit (INDEPENDENT_AMBULATORY_CARE_PROVIDER_SITE_OTHER): Payer: Medicare Other | Admitting: Internal Medicine

## 2018-02-11 ENCOUNTER — Encounter: Payer: Self-pay | Admitting: Internal Medicine

## 2018-02-11 VITALS — BP 128/74 | HR 107 | Ht 63.0 in | Wt 275.8 lb

## 2018-02-11 DIAGNOSIS — Z23 Encounter for immunization: Secondary | ICD-10-CM

## 2018-02-11 DIAGNOSIS — Z79899 Other long term (current) drug therapy: Secondary | ICD-10-CM | POA: Diagnosis not present

## 2018-02-11 DIAGNOSIS — R0609 Other forms of dyspnea: Secondary | ICD-10-CM | POA: Diagnosis not present

## 2018-02-11 DIAGNOSIS — J849 Interstitial pulmonary disease, unspecified: Secondary | ICD-10-CM | POA: Diagnosis not present

## 2018-02-11 DIAGNOSIS — J479 Bronchiectasis, uncomplicated: Secondary | ICD-10-CM

## 2018-02-11 DIAGNOSIS — R0600 Dyspnea, unspecified: Secondary | ICD-10-CM

## 2018-02-11 NOTE — Patient Instructions (Signed)
ILD (interstitial lung disease) ([place]) Bronchiectasis without complication ([place])   - clinically this is stable based on fact thre is no cough and your o2 level walking is fine =-Repeat high-resolution CT chest in 6 months (last one with [place] was in 2016 and last one at [facility] was February 2017] -Keep ILD clinic appointment with Dr. Dyanne Carrel at [facility] early 2020 -High-dose flu shot February 11, 2018 today if you do not have allergy to it  Dyspnea on exertion  - your shortness of breath is because of weight and arthritis and physical deconditioning and stiff heart muscle -Only some or little of your shortness of breath is because of the pulmonary problem -Continue weight loss  High risk medication use   -As far as I am concerned it is okay to reduce her prednisone back to 10 mg/day but this depends on your rheumatologist -Continue other medications per [facility]  Follow-up 6 months high-resolution CT chest and follow-up in ILD clinic

## 2018-02-11 NOTE — Progress Notes (Signed)
HPI  OV 01/03/2015  Chief Complaint  Patient presents with  . Follow-up    Pt c/o DOE, resolving cough that started over the weekend, chest discomfort when SOB and when lying down. Pt c/o increasing fatigue.    She has severe class 3 dyspnea with hypoxemia on account of BMI 50, diaast dsyfn, chronic pain, deconditioning and ILD due to RA on celcept/pred/bactrim   I personally last saw her in march 2-016. Since then has seen other provicers in this office and most recently in June 2016. In June 2016 admitted for acute CHF - cath showed mild pulm htn only with PA mean 24 and minimal CAD (reviewed chart of procedures June 2016). Dx as acute diast chf. Since then better. SHe had fu HRCT 11/29/14 that shows progression in ILD since march 2016 and now with UIP pattern (personally visualized image). She is finally accepting of fact that ILD is not makin cause of dyspnea and obesity is main problem. She is wondering about dc cellcept/pred/bactrim so as to minimize polypharmacy and side effect profile    OV 02/09/2015  Chief Complaint  Patient presents with  . Follow-up    Pt states her breathing is doing well the past few days. Pt recently went to [facility] ED for CP. Pt c/o prod cough with tan mucus. Pt denies CP/tightness    She has severe class 3 dyspnea with hypoxemia on account of BMI 50, diaast dsyfn, chronic pain, deconditioning and ILD due to RA. She is having progressive ILD. But given her miserable quality of life and polypharmacy and other side effects we decided to stop the CellCept and Bactrim. We also advise prednisone taper. This was all in the end of August 2016. But on 01/27/2015 she ended up in the emergency room with some respiratory difficulty. Her prednisone was bumped up. She subsequently saw her rheumatologist [name] was now tapering her prednisone again. There is also some concerns of headaches and possibly temporal arteritis based on her description and apparently some kind of a  biopsy scheduled. She feels that since stopping her CellCept and Bactrim her cough is returned. Nevertheless weight and knee pain and back pain continued to be major issues for her along with polypharmacy. Her Polysorb was includes MS Contin  [name]    OV 02/21/2015  - a ACUTE VISIT  Chief Complaint  Patient presents with  . Acute Visit    Pt c/o of chest tightness, productive cough with white/yellow mucus, and wheezing. Pt also c/o of feeling abdominal swelling/distention. Pt also c/o of constant fatigue. Pt states symptoms have been present x1 week.     Acute visit for this morbidly obese, rheumatoid arthritis patient with ILD who is now off CellCept  I saw her as recently as 02/09/2015. She reports that for the past 1 week she's having increased chest congestion, chest tightness, wheezing, shortness of breath, cough and yellow  sputum more than baseline. Symptoms are rated as moderate in severity. Relieved by inhaler. She has tried Mucinex for 1 week with some initial partial relief but symptoms progressed. So she made an acute visit.  She continues to stay off CellCept. She is not on prednisone 10 mg per day. She tells me [name] has held her prednisone at the dose because of concerns of temporal arteritis but apparently now that his diagnoses being eliminated in the differential diagnosis. She will see [name] in the next few weeks to decide on  further prednisone taper.    OV 04/16/2015  Chief Complaint  Patient presents with  . Follow-up    Pt states she felt much better after completing the abx and pred. Pt states her SOB is at basline, prod cough with yellow mucus at times, pt c/o chest tightness.     Morbidly obese female with rheumatoid arthritis with interstitial lung disease UIP pattern with associated deconditioning and diastolic dysfunction  She continues to remain off CellCept. Last seen in October 2016. At that time I discharged her from the  office with doxycycline and prednisone taper. After that early November 2016 she was given another course of Doxy and prednisone burst. This did not help. Sputum culture at this time showed normal flora. She called again with the middle of November was given Levaquin and prednisone burst. She feels Levaquin helped her immensely. At this point in time she is reporting progressive dyspnea and also worsening arthritis. She wants to go back on immunomodulators again. She has discussed Imuran with [name]. She is due to see [name] tomorrow. She also feels that she is unable to come off prednisone. She feels at a minimum she needs 10 mg of prednisone per day. She will discuss this with Dr. D her rheumatologist tomorrow. She's also interested in a second opinion at [facility] interstitial lung disease clinic which I did discuss with her most recently. She continues to use oxygen.     OV 07/17/2015  Chief Complaint  Patient presents with  . Follow-up    Pt states that she has improved. Pt c/o continued SOB with exertion, some ocugh and wheeze, and some intermittent chest tightness. Pt states that she does sleep a lot. Pt states that her heart rate remains high and that cardiology said it was due to her lungs. Pt is currently on Lasix, 15m, but still seems to retain a lot of fluid and is not seeming to go to the bathroom as frequently.    Follow-up interstitial lung disease secondary to rheumatoid arthritis in the setting of morbid obesity, poor quality of life and heavy chronic pain opioid use and depressive symptoms  Last seen November 2016. Since then she has seen [name] rheumatology clinic. She was given instructions on Imuran which she has red but she has not decided whether she should take it or not. She subsequently has followed up with [facility] interstitial lung disease clinic. CT scan of the chest reviewed result shows possible UIP pattern 06/12/2015. She had autoimmune profile which was strongly positive for  both rheumatoid factor and cyclic citrulline peptide both markers for rheumatoid arthritis. She did see Dr. CMargreta JourneyB on 07/02/2015. It appears that the note is not complete but according to the patient was supposed to be a multidisciplinary conference and they will get back to her. It is possible a second immunomodulators agent and is being contemplated but patient is leery of this given prior issues with opportunistic infection not otherwise specified and admissions to the hospital. In the interim she is also seen cardiology Janr 2017 Dr. BGwenlyn Foundwho did not feel dyspnea was cardiac related. I reviewed his note  OV 10/17/2015  Chief Complaint  Patient presents with  . Follow-up    Pt reports she feels improved. Pt c/o continued SOB with exertion, some wheeze and occasional cough. Pt denies CP/tightness.     Follow-up interstitial lung disease secondary to rheumatoid arthritis in the setting of morbid obesity, poor quality of life and heavy chronic pain opioid  use and depressive symptoms   Since seeing me last in February 2017. She followed up at [facility] again pulmonary clinic in March 2017. I reviewed those notes. She saw Dr. Margreta Journey B on 07/31/2015. Due to the presence of bronchiectasis she's been started on  azithromycin 3 times a week. She says this has helped. In addition due to physical deconditioning and obesity she is getting home physical therapy is also help. She is interested in pulmonary rehabilitation although given obesity and back pain can be challenging. She nevertheless wants to try. She is on daily prednisone 10 mg a day. She met with [name] in the rheumatology clinic and has been referred now to [facility] rheumatology clinic for second opinion for a second immunomodulators. She continues to be morbidly obese and has been losing weight. She is somewhat interested in visiting with a [facility] for weight loss.   OV 02/18/2016  Chief Complaint  Patient presents with    . Follow-up    Pt states she did not go to pulm rehab d/t BIL knee pain. Pt denies change in SOB, significant cough, CP/tightness and f/c/s. Pt states she is exercising at home. pt states she is doing well overall.    Problem List: 1. Extensive bronchiectasis - cylindrical vs. Traction - has been present and not significantly changed from 2014 to 2017 - has history of hospitalization for what sounds like pneumonia vs. Bronchiectasis flare as far back as 1999; underwent bronchoscopy which made her feel "like a new woman" in 1999 2. Pulmonary fibrosis - basilar predominant reticular opacities that have not change from 2014 to 2017 3. Rheumatoid arthritis: RF 959 ; CCP >300 - has been treated with methotrexate, biologics, etc in the past - none in the past several years - currently on 7.5/75m alter day 4. Morbid obesity with deconditioning - has had several hospitalizations in recent months; very deconditioned at present time  - [facility]   Followup for above  - [facility]. [facility] Review of the notes suggest that they have her on a slightly lower dose of prednisone with intent to taper to 5 mg per day. He also have her on hydroxychloroquine. No further disease modifying agents are being considered for rheumatoid arthritis. In terms of her interstitial lung disease and bronchiectasis she's on conservative therapy along with Activella device. This is helping and she feels better. Today she came in walking with her walker. This the happiest I have f seen her. She's not lost any weight but [facility]. She tried to go to pulmonary rehabilitation but could not because of knee pain. She is up-to-date with pneumonia vaccine. She's had a flu shot  this season.    OV 03/18/2016  Chief Complaint  Patient presents with  . Acute Visit    Pt states breathing is not that bad today. Pt states she has cogestion build up, no tightness, SOB w/o excertion. Pt states breathing has been better since last visit except the mucus. No cough feels the need to cough but can not.      follow-up bronchiectasis and immunosppessive thrapy. Visit 3 month olow-up. Body mssindex is stll very high but she says she has lost weight. Overal well. Last few days increasd symptoms of mucus, cough and subjective unwellness. No fever or colored sputum. But she feells she needs antibiotic  and prednisone  OV 06/23/2016  Chief Complaint  Patient presents with  . Follow-up    Pt states her SOB has been doing well but she has been more fatigued. Pt has an occ prod cough with light yellow mucus and chest tightness when SOB - resolves with rest.    Follow-up bronchiectasis with interstitial lung disease on immunosuppressive therapy due to rheumatoid arthritis. Last visit 01/17/2016. Since then she's lost 5 pounds of weight according to history. . She is here with her daughter. No acute bronchitis symptoms. Overall stable. The main issues that she's having increased joint pain and she is only on plaquenil. No fever or colored sputum.   OV 02/16/2017  Chief Complaint  Patient presents with  . Follow-up    Pt states breathing has been okay. Stated she had a URI x1 month ago which she developed while in rehab after hip fx. Occ. SOB and occ. chest tightness. Denies any cough at this time.    Follow-up bronchiectasis with interstitial lung disease on immunosuppressive therapy due to rheumatoid arthritis. Last visit was 6 months ago approximately. Since then she has continued to lose weight through dietary control. She feels better. However in the summer 2018 she fell down due to orthostasis and fractured her left hip. This wasn't complicated by MRSA infection according to her  history. She is now undergoing physical therapy. Overall she's feeling well. She is very proud of her weight loss. Dyspnea is improved. She missed [facility] appointment for ILD clinic but she will make this again. She uses oxygen with heavy exertion at physical therapy and at night.  OV 02/11/2018  Subjective:  Patient ID: [name], female , DOB: July 14, 1940 , age 46 y.o. , MRN: [id number] , ADDRESS: [place] [id number]   02/11/2018 -   Chief Complaint  Patient presents with  . Follow-up    Pt states she has been doing okay since last visit. States she has had some pain in the mid region of her chest x3 weeks and has also been having problems with fatigue. States she has had some occ coughing. Her main complaints are the pain in her chest and the exhaustion.     HPI [name] 77 y.o. -follow-up ILD with bronchiectasis in the setting of rheumatoid arthritis and obesity and physical deconditioning  It is almost a year since I last saw her.  Since then her daughter died in [place] with advanced sarcoma.  She is getting grief counseling from hospice.  She also has palliative care support at home.  She also in the last year has fallen and hurt her back.  She is on attending physical therapy which is helping her.  She says she is lost 30 pounds in the last 1 year.  Because of all this functional status is somewhat better but still she is extremely dyspneic.  In fact when she walked 185 feet x 3 laps on room air using a walker: She is only able to complete a lap and a half and stop because of extreme shortness of breath.  Her resting pulse ox is 98%.  Final pulse ox was 98%.  And her heart rate went from 107/min to 140/min.  She has not yet had a flu shot.  Of note she is on prednisone 15 mg/day which has helped her arthritis.  She is asking if it is okay to go back down to 10 mg/day from a lung standpoint.  She is also on  a different arthritis immunomodulator which is  helped her rheumatoid arthritis pain.  She gets this treatment from [facility] rheumatology.  Pulmonary ILD medications also being managed at [facility].     ROS - per HPI     has a past medical history of Acute on chronic diastolic (congestive) heart failure ([place]), Anemia, Arthritis, Asthma, Carotid artery occlusion, Chronic fatigue, Chronic kidney disease, Closed left hip fracture (HCC), Clotting disorder ([place]), Contusion of left knee, COPD (chronic obstructive pulmonary disease) ([place]), Depression, reactive, Diabetes mellitus, Diastolic dysfunction, Difficulty in walking, Family history of heart disease, Generalized muscle weakness, Gout, High cholesterol, History of falling, Hypertension, Hypothyroidism, Interstitial lung disease ([place]), 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 ([place]) (02/17/2012), Morbid obesity ([place]), Morbid obesity ([place]), Neuromuscular disorder ([place]), Normal coronary arteries, RA (rheumatoid arthritis) ([place]), Rheumatoid arthritis ([place]), Shortness of breath, Spinal stenosis of lumbar region, Thyroid disease, Unspecified lack of coordination, and URI (upper respiratory infection).   reports that she has never smoked. She has never used smokeless tobacco.  Past Surgical History:  Procedure Laterality Date  . ABDOMINAL HYSTERECTOMY    . BRAIN SURGERY     Gamma knife 10/13. Needs repeat spring  '14  . CARDIAC CATHETERIZATION N/A 10/31/2014   Procedure: Right/Left Heart Cath and Coronary Angiography;  Surgeon: [name], MD;  Location: [place] [facility];  Service: Cardiovascular;  Laterality: N/A;  . ESOPHAGOGASTRODUODENOSCOPY (EGD) WITH PROPOFOL N/A 09/14/2014   Procedure: ESOPHAGOGASTRODUODENOSCOPY (EGD) WITH PROPOFOL;  Surgeon: [name], MD;  Location: [facility];  Service: Endoscopy;  Laterality: N/A;  . INCISION AND DRAINAGE HIP Left 01/16/2017   Procedure: IRRIGATION AND DEBRIDEMENT LEFT HIP;  Surgeon: [name],  MD;  Location: [facility];  Service: Orthopedics;  Laterality: Left;  . INTRAMEDULLARY (IM) NAIL INTERTROCHANTERIC Left 11/29/2016   Procedure: INTRAMEDULLARY (IM) NAIL INTERTROCHANTRIC;  Surgeon: [name], MD;  Location: [place];  Service: Orthopedics;  Laterality: Left;  . OVARY SURGERY    . SHOULDER SURGERY Left   . TONSILLECTOMY  age 80  . VIDEO BRONCHOSCOPY Bilateral 05/31/2013   Procedure: VIDEO BRONCHOSCOPY WITHOUT FLUORO;  Surgeon: Brand Males, MD;  Location: [place];  Service: Cardiopulmonary;  Laterality: Bilateral;  . video bronscoscopy  2000   lung    Allergies  Allergen Reactions  . Codeine Swelling and Other (See Comments)    Facial swelling Chest pain and swelling in legs  Facial swelling  . Infliximab Anaphylaxis    "sent me into shock" "sent me into shock"  . Lisinopril Swelling    Face and neck swelling Face and neck swelling    Immunization History  Administered Date(s) Administered  . Influenza Split 01/29/2012, 03/30/2013, 02/10/2014  . Influenza, High Dose Seasonal PF 02/16/2017  . Influenza,inj,Quad PF,6+ Mos 02/09/2015, 12/18/2015  . Influenza-Unspecified 01/30/2015  . PPD Test 04/23/2015  . Pneumococcal Conjugate-13 08/21/2014  . Pneumococcal Polysaccharide-23 02/17/2012  . Tdap 12/27/2015    Family History  Problem Relation Age of Onset  . Diabetes Mother   . Heart attack Mother   . Hypertension Father   . Lung cancer Father   . Diabetes Sister   . Diabetes Brother   . Hypertension Brother   . Heart disease Brother   . Heart attack Brother   . Kidney cancer Brother   . Uterine cancer Daughter   . Breast cancer Sister   . Rheum arthritis Maternal Uncle   . Gout Brother   . Kidney failure Brother  x 5     Current Outpatient Medications:  .  acetaminophen (TYLENOL) 500 MG tablet, Take 1,000 mg by mouth every 6 (six) hours as needed for moderate pain. , Disp: , Rfl:  .  albuterol (PROVENTIL HFA;VENTOLIN HFA)  108 (90 BASE) MCG/ACT inhaler, Inhale 2 puffs into the lungs every 6 (six) hours as needed for wheezing or shortness of breath., Disp: , Rfl:  .  allopurinol (ZYLOPRIM) 300 MG tablet, Take 300 mg by mouth daily., Disp: , Rfl:  .  aspirin 325 MG tablet, Take 1 tablet (325 mg total) by mouth daily., Disp: 60 tablet, Rfl: 0 .  atorvastatin (LIPITOR) 20 MG tablet, Take 10 mg by mouth every evening. , Disp: , Rfl:  .  azithromycin (ZITHROMAX) 500 MG tablet, Take 1 tablet by mouth daily. TAKE ON MON, WED & FRI, Disp: , Rfl: 0 .  Calcium Carb-Cholecalciferol (CALCIUM + D3 PO), Take 2 tablets by mouth daily with lunch. , Disp: , Rfl:  .  cholecalciferol (VITAMIN D) 1000 UNITS tablet, Take 2,000 Units by mouth daily with lunch. , Disp: , Rfl:  .  colchicine 0.6 MG tablet, Take 0.6 mg by mouth daily., Disp: , Rfl:  .  cyclobenzaprine (FLEXERIL) 5 MG tablet, Take 1 tablet (5 mg total) by mouth at bedtime as needed for muscle spasms., Disp: 90 tablet, Rfl: 3 .  diclofenac sodium (VOLTAREN) 1 % GEL, Apply 1 application topically 3 (three) times daily. Both hands and knees, Disp: 3 Tube, Rfl: 4 .  docusate sodium (COLACE) 100 MG capsule, Take 1 capsule (100 mg total) by mouth 2 (two) times daily., Disp: 10 capsule, Rfl: 0 .  DULoxetine (CYMBALTA) 60 MG capsule, Take 1 capsule (60 mg total) by mouth daily., Disp: 90 capsule, Rfl: 3 .  fluticasone (FLONASE) 50 MCG/ACT nasal spray, Place 2 sprays into both nostrils daily., Disp: 16 g, Rfl: 3 .  furosemide (LASIX) 80 MG tablet, Take 80 mg by mouth daily. , Disp: , Rfl: 1 .  gabapentin (NEURONTIN) 600 MG tablet, Take two in the morning , Two mid- afternoon and two at bedtime, Disp: 540 tablet, Rfl: 3 .  guaiFENesin (MUCINEX) 600 MG 12 hr tablet, Take 1,200 mg by mouth 2 (two) times daily., Disp: , Rfl:  .  hydroxychloroquine (PLAQUENIL) 200 MG tablet, Take 200 mg by mouth daily., Disp: , Rfl:  .  ipratropium-albuterol (DUONEB) 0.5-2.5 (3) MG/3ML SOLN, Take 3 mLs by  nebulization. Every 6 hours as needed for cough or wheezing, Disp: , Rfl:  .  levothyroxine (SYNTHROID, LEVOTHROID) 50 MCG tablet, Take 1 tablet (50 mcg total) by mouth daily., Disp: 30 tablet, Rfl: 1 .  metoprolol succinate (TOPROL-XL) 50 MG 24 hr tablet, Take 1 tablet (50 mg total) by mouth every morning. Take with or immediately following a meal., Disp: , Rfl:  .  morphine (MS CONTIN) 15 MG 12 hr tablet, Take 1 tablet (15 mg total) by mouth every 12 (twelve) hours., Disp: 60 tablet, Rfl: 0 .  morphine (MSIR) 15 MG tablet, Take 1 tablet (15 mg total) by mouth 2 (two) times daily as needed for severe pain., Disp: 60 tablet, Rfl: 0 .  Multiple Vitamin (MULTIVITAMIN WITH MINERALS) TABS, Take 1 tablet by mouth daily. , Disp: , Rfl:  .  olmesartan (BENICAR) 20 MG tablet, Take 1 tablet (20 mg total) by mouth daily., Disp: , Rfl:  .  OXYGEN-HELIUM IN, Inhale 2 L into the lungs at bedtime. And on exertion, Disp: , Rfl:  .  pantoprazole (PROTONIX) 40 MG tablet, take 1 tablet by mouth once daily MINUTES BEFORE 1ST MEAL OF THE DAY, Disp: 90 tablet, Rfl: 0 .  polyethylene glycol (MIRALAX / GLYCOLAX) packet, Take 17 g by mouth daily., Disp: 14 each, Rfl: 0 .  potassium chloride (K-DUR,KLOR-CON) 10 MEQ tablet, Take 20 mEq by mouth 2 (two) times daily., Disp: , Rfl:  .  PREDNISONE PO, Take 15 mg by mouth every morning. , Disp: , Rfl: 0 .  sodium chloride (OCEAN) 0.65 % SOLN nasal spray, Place 1 spray into both nostrils as needed for congestion., Disp: , Rfl:  .  Spacer/Aero-Holding Chambers (AEROCHAMBER MV) inhaler, Use as instructed, Disp: 1 each, Rfl: 0 .  Tofacitinib Citrate (XELJANZ) 5 MG TABS, Take 5 mg by mouth 2 (two) times daily., Disp: , Rfl:  .  VICTOZA 18 MG/3ML SOPN, Inject 1.8 mg as directed daily. , Disp: , Rfl: 0 .  allopurinol (ZYLOPRIM) 300 MG tablet, Take 300 mg by mouth daily. , Disp: , Rfl:       Objective:   Vitals:   02/11/18 1020  BP: 128/74  Pulse: (!) 107  SpO2: 98%  Weight:  275 lb 12.8 oz (125.1 kg)  Height: _0  (1.6 m)    Estimated body mass index is 48.86 kg/m as calculated from the following:   Height as of this encounter: _1  (1.6 m).   Weight as of this encounter: 275 lb 12.8 oz (125.1 kg).  _2 @  [employer]   02/11/18 1020  Weight: 275 lb 12.8 oz (125.1 kg)     Physical Exam  General Appearance:    Alert, cooperative, no distress, appears stated age - yes , sitting on - chari , Deconditioned looking - yes . OBESE. HAS WALKER  Head:    Normocephalic, without obvious abnormality, atraumatic  Eyes:    PERRL, conjunctiva/corneas clear,  Ears:    Normal TM's and external ear canals, both ears  Nose:   Nares normal, septum midline, mucosa normal, no drainage    or sinus tenderness. OXYGEN ON  - no . Patient is @ RA   Throat:   Lips, mucosa, and tongue normal; teeth and gums normal. Cyanosis on lips - no  Neck:   Supple, symmetrical, trachea midline, no adenopathy;    thyroid:  no enlargement/tenderness/nodules; no carotid   bruit or JVD  Back:     Symmetric, no curvature, ROM normal, no CVA tenderness  Lungs:     Distress - no , Wheeze no, Barrell Chest - no, Purse lip breathing - no, Crackles - faint at base4   Chest Wall:    No tenderness or deformity. Scars in chest no   Heart:    Regular rate and rhythm, S1 and S2 normal, no rub   or gallop, Murmur - no  Breast Exam:    NOT DONE  Abdomen:     Soft, non-tender, bowel sounds active all four quadrants,    no masses, no organomegaly. Visceral obesity - YES  Genitalia:   NOT DONE  Rectal:   NOT DONE  Extremities:   Extremities normal, atraumatic, Clubbing - no, Edema - no  Pulses:   2+ and symmetric all extremities  Skin:   Stigmata of Connective Tissue Disease - YES of RA  Lymph nodes:   Cervical, supraclavicular, and axillary nodes normal  Psychiatric:  Neurologic:    pleasant . HAS WALKER  CAm-ICU - neg, Alert and Oriented x 3 - yes, Moves all 4s -  yes, Speech - normal,  Cognition - intact           Assessment:       ICD-10-CM   1. ILD (interstitial lung disease) ([place]) J84.9   2. Bronchiectasis without complication ([place]) U04.5   3. Dyspnea on exertion R06.09   4. High risk medication use Z79.899        Plan:     Patient Instructions  ILD (interstitial lung disease) ([place]) Bronchiectasis without complication ([place])   - clinically this is stable based on fact thre is no cough and your o2 level walking is fine =-Repeat high-resolution CT chest in 6 months (last one with [place] was in 2016 and last one at [facility] was February 2017] -Keep ILD clinic appointment with Dr. Dyanne Carrel at [facility] early 2020 -High-dose flu shot February 11, 2018 today if you do not have allergy to it  Dyspnea on exertion  - your shortness of breath is because of weight and arthritis and physical deconditioning and stiff heart muscle -Only some or little of your shortness of breath is because of the pulmonary problem -Continue weight loss  High risk medication use   -As far as I am concerned it is okay to reduce her prednisone back to 10 mg/day but this depends on your rheumatologist -Continue other medications per [facility]  Follow-up 6 months high-resolution CT chest and follow-up in ILD clinic   > 50% of this > 25 min visit spent in face to face counseling or coordination of care - by this undersigned MD - Dr Brand Males. This includes one or more of the following documented above: discussion of test results, diagnostic or treatment recommendations, prognosis, risks and benefits of management options, instructions, education, compliance or risk-factor reduction     SIGNATURE    Dr. Brand Males, M.D., F.C.C.P,  Pulmonary and Critical Care Medicine Staff Physician, [place] Director - Interstitial Lung Disease  Program  [facility] [place] [facility] [place],  [place], [address]  Pager: [phone], If no answer or between  15:00h - 7:00h: call [phone] Telephone: [phone]  10:41 AM 02/11/2018

## 2018-02-12 ENCOUNTER — Ambulatory Visit: Payer: Medicare Other | Admitting: Physical Therapy

## 2018-02-12 ENCOUNTER — Encounter: Payer: Self-pay | Admitting: Physical Therapy

## 2018-02-12 DIAGNOSIS — R2689 Other abnormalities of gait and mobility: Secondary | ICD-10-CM

## 2018-02-12 DIAGNOSIS — M545 Low back pain, unspecified: Secondary | ICD-10-CM

## 2018-02-12 DIAGNOSIS — M79605 Pain in left leg: Secondary | ICD-10-CM

## 2018-02-12 DIAGNOSIS — G8929 Other chronic pain: Secondary | ICD-10-CM

## 2018-02-12 NOTE — Therapy (Signed)
[place], [place], [address] Phone: [phone]   Fax:  [phone]  Physical Therapy Treatment  Patient Details  Name: [name] MRN: [id number] Date of Birth: [birth year] Referring Provider (PT): Zollie Beckers MD   Encounter Date: 02/12/2018  PT End of Session - 02/12/18 1114    Visit Number  6    Number of Visits  17    Date for PT Re-Evaluation  03/10/18    Authorization Type  medicare progress note at 10th visit and Kx mod by 15th visit    PT Start Time  1108   pt arrive 8 min late   PT Stop Time  1148    PT Time Calculation (min)  40 min    Activity Tolerance  Patient tolerated treatment well    Behavior During Therapy  [facility] for tasks assessed/performed       Past Medical History:  Diagnosis Date  . Acute on chronic diastolic (congestive) heart failure ([place])   . Anemia    iron deficiency anemia - secondary to blood loss ( chronic)   . Arthritis    endstage changes bilateral knees/bilateral ankles.   . Asthma   . Carotid artery occlusion   . Chronic fatigue   . Chronic kidney disease   . Closed left hip fracture ([place])   . Clotting disorder ([place])    pt denies this  . Contusion of left knee    due to fall 1/14.  [name] COPD (chronic obstructive pulmonary disease) (HCC)    pulmonary fibrosis  . Depression, reactive   . Diabetes mellitus    type II   . Diastolic dysfunction   . Difficulty in walking   . Family history of heart disease   . Generalized muscle weakness   . Gout   . High cholesterol   . History of falling   . Hypertension   . Hypothyroidism   . Interstitial lung disease ([place])   . 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    . Morbid obesity ([place])   . Morbid obesity ([place])   . Neuromuscular disorder ([place])    diabetic neuropathy   . Normal coronary arteries    cardiac  catheterization performed  10/31/14  . RA (rheumatoid arthritis) ([place])    has been off methotreaxte since 10/13.  [name] Rheumatoid arthritis ([place])   . Shortness of breath   . Spinal stenosis of lumbar region   . Thyroid disease   . Unspecified lack of coordination   . URI (upper respiratory infection)     Past Surgical History:  Procedure Laterality Date  . ABDOMINAL HYSTERECTOMY    . BRAIN SURGERY     Gamma knife 10/13. Needs repeat spring  '14  . CARDIAC CATHETERIZATION N/A 10/31/2014   Procedure: Right/Left Heart Cath and Coronary Angiography;  Surgeon: [name], MD;  Location: [facility];  Service: Cardiovascular;  Laterality: N/A;  . ESOPHAGOGASTRODUODENOSCOPY (EGD) WITH PROPOFOL N/A 09/14/2014   Procedure: ESOPHAGOGASTRODUODENOSCOPY (EGD) WITH PROPOFOL;  Surgeon: [name], MD;  Location: [facility];  Service: Endoscopy;  Laterality: N/A;  . INCISION AND DRAINAGE HIP Left 01/16/2017   Procedure: IRRIGATION AND DEBRIDEMENT LEFT HIP;  Surgeon: [name], MD;  Location: [facility];  Service: Orthopedics;  Laterality: Left;  . INTRAMEDULLARY (IM) NAIL INTERTROCHANTERIC Left 11/29/2016   Procedure: INTRAMEDULLARY (IM) NAIL INTERTROCHANTRIC;  Surgeon:  [name], MD;  Location: [facility];  Service: Orthopedics;  Laterality: Left;  . OVARY SURGERY    . SHOULDER SURGERY Left   . TONSILLECTOMY  age 6  . VIDEO BRONCHOSCOPY Bilateral 05/31/2013   Procedure: VIDEO BRONCHOSCOPY WITHOUT FLUORO;  Surgeon: Brand Males, MD;  Location: [facility];  Service: Cardiopulmonary;  Laterality: Bilateral;  . video bronscoscopy  2000   lung    Vitals:   02/12/18 1115  SpO2: 96%    Subjective Assessment - 02/12/18 1115    Subjective  "I saw my pulmonologist and they said I was doing good and they want me to start weaning from the oxygen and work on my endurance. I did really well with the testing and thats why I don't have my oxygen with me today. My leg  is doing better, its only my low back bothering me at a 4/10"    Currently in Pain?  Yes    Pain Score  4     Pain Location  Back    Pain Orientation  Left    Pain Type  Chronic pain    Aggravating Factors   prolong standing/ walking    Pain Score  0    Pain Location  Hip    Pain Orientation  Right;Left    Pain Descriptors / Indicators  Aching    Pain Onset  More than a month ago                       [employer] Adult PT Treatment/Exercise - 02/12/18 1116      Exercises   Exercises  Knee/Hip;Lumbar      Lumbar Exercises: Stretches   Other Lumbar Stretch Exercise  seated low back stretch with hands on green physioball 3 x 10sec forward, 3 x 10 to the R to promote L lowback stretch    cues to count out loud for proper breathing     Lumbar Exercises: Standing   Other Standing Lumbar Exercises  pressing down through bil UE with physioball 2 x 10 with breathing out during pressing down and in with relaxing with extension      Knee/Hip Exercises: Aerobic   Nustep  L5 x 6 min UE/LE   to begin working on endurnace training     Knee/Hip Exercises: Standing   Hip Abduction  2 sets;10 reps;Stengthening;Both   with RW   Hip Extension  2 sets;10 reps;Knee straight;Stengthening;Both   with RW   Other Standing Knee Exercises  marching in RW 2 x 10       Knee/Hip Exercises: Seated   Sit to Sand  2 sets   8 reps ea.             PT Education - 02/12/18 1145    Education Details  updated HEP for standing marching     Person(s) Educated  Patient    Methods  Explanation;Verbal cues;Handout    Comprehension  Verbalized understanding;Verbal cues required       PT Short Term Goals - 01/20/18 1316      PT SHORT TERM GOAL #1   Title  pt to be I with initial HEP     Baseline  done today but pulse ox not working    Time  4    Period  Weeks    Status  New      PT SHORT TERM GOAL #2   Title  pt to verbalize/ demo proper posture and gait mechanics  to prevent and reduce  hip and low back pain     Time  4    Period  Weeks    Status  New      PT SHORT TERM GOAL #3   Title  pt to be able to stand / ambulate >/= 10 with LRAD with </= 6/10 pain to promote therapeutic progression     Time  4    Period  Weeks        PT Long Term Goals - 01/13/18 1712      PT LONG TERM GOAL #1   Title  pt to increase bil Le strength to >/= 4/5 to promote hip/ knee stability and maximzie safety with prolonged standing/ walking activity    Time  8    Period  Weeks    Status  New    Target Date  03/10/18      PT LONG TERM GOAL #2   Title  pt to be able to ambulate >/= 30 min with LRAD reporting </= 4/10 pain for functional endurance for community and in home ambulation    Time  8    Period  Weeks    Status  New    Target Date  03/10/18      PT LONG TERM GOAL #3   Title  pt increase trunk mobility by >/= 10 degrees in flexion and sidebending to with </= 3/10 pain to assist with functional mobility required for ADL s    Time  8    Period  Weeks    Status  New    Target Date  03/10/18      PT LONG TERM GOAL #4   Title  incresae FOTO score to </= 60% limited to demo improvement in function    Time  8    Period  Weeks    Status  New    Target Date  03/10/18      PT LONG TERM GOAL #5   Title  pt to be I with all HEP given as of last visit to maintain and progress current level of function     Time  8    Period  Weeks    Status  New    Target Date  03/10/18            Plan - 02/12/18 1148    Clinical Impression Statement  pt reports her pulmonolgist has released her from having to use oxygen all the time and use only PRN, but to focus on weaning from it. Continued working on strengthening of the hips and core progressing to standing. She does fatigue quickly with standing exercises. Her oxygen remained between 99-96% throughout the entire session. no pain noted at the end of the session.     PT Next Visit Plan  potentilal SIJ involvement on L, hamstring  stretching gently, core activation, gait trainining and endurance, hip strengtheing, LAD to promote hip flexor activation.     PT Home Exercise Plan  lower trunk rotation, gentle hamstring stretching (sitting and supine), posterior pelvic tilt. supine marching. standing marching using RW    Consulted and Agree with Plan of Care  Patient       Patient will benefit from skilled therapeutic intervention in order to improve the following deficits and impairments:  Abnormal gait, Pain, Decreased activity tolerance, Decreased balance, Postural dysfunction, Improper body mechanics, Decreased strength  Visit Diagnosis: Chronic bilateral low back pain without sciatica  Pain in left leg  Other abnormalities of gait and mobility     Problem List Patient Active Problem List   Diagnosis Date Noted  . Pain in left hip 02/18/2017  . Wound infection after surgery, initial encounter 01/16/2017  . Closed nondisplaced intertrochanteric fracture of left femur with delayed healing 01/14/2017  . Postoperative wound infection 01/14/2017  . Gout 11/29/2016  . Depression 11/29/2016  . Closed left hip fracture ([place]) 11/29/2016  . GERD (gastroesophageal reflux disease) 11/29/2016  . Closed intertrochanteric fracture of hip, left, initial encounter ([place]) 11/29/2016  . Physical deconditioning 07/31/2015  . Pulmonary fibrosis, postinflammatory ([place]) 07/31/2015  . Tendinitis of left rotator cuff 06/13/2015  . Acute on chronic diastolic heart failure ([place]) 04/21/2015  . Acute pulmonary edema (HCC)   . AKI (acute kidney injury) ([place])   . Peripheral edema   . Acute on chronic respiratory failure with hypoxia ([place]) 04/17/2015  . CAP (community acquired pneumonia) 04/17/2015  . Acute kidney injury superimposed on chronic kidney disease ([place]) 04/17/2015  . Hypotension 04/17/2015  . Chronic respiratory failure with hypoxia ([place]) 02/09/2015  . Dyspnea and respiratory abnormality 01/03/2015  . Normal coronary  arteries 11/02/2014  . Morbid obesity ([place]) 10/30/2014  . Dysphagia 08/21/2014  . Sore throat 07/18/2014  . Chronic fatigue 06/21/2014  . DOE (dyspnea on exertion) 05/24/2014  . High risk medication use 04/17/2014  . Aphasia 02/12/2014  . 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 Lumbar facet arthropathy 01/18/2014  . Acute bronchitis 07/24/2013  . Bilateral edema of lower extremity 05/30/2013  . Chronic cough 05/24/2013  . ILD (interstitial lung disease) ([place]) 04/10/2013  . Greater trochanteric bursitis of left hip 08/13/2012  . Spinal stenosis of lumbar region with radiculopathy 07/12/2012  . Inability to walk 07/12/2012  . Acute lumbar back pain 07/12/2012  . Asymptomatic bacteriuria 07/12/2012  . 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 ([place]) 02/17/2012  . Benign neoplasm of meninges ([place]) 02/12/2012  . Brain mass 01/28/2012  . Chest pain with moderate risk of acute coronary syndrome 01/28/2012  . Rheumatoid arthritis ([place]) 01/28/2012  . Primary osteoarthritis of right knee 01/28/2012  . Dyslipidemia   . Thyroid disease   . Essential hypertension    [place]arr [place] PT, DPT, LAT, ATC  02/12/18  11:52 AM      [facility] [address] [place], [place], [address] Phone: [phone]   Fax:  [phone]  Name: ANNAMAY LAYMON MRN: [id number] Date of Birth: 01-Oct-1940

## 2018-02-17 ENCOUNTER — Ambulatory Visit (INDEPENDENT_AMBULATORY_CARE_PROVIDER_SITE_OTHER): Payer: Medicare Other | Admitting: Orthopaedic Surgery

## 2018-02-17 ENCOUNTER — Encounter (INDEPENDENT_AMBULATORY_CARE_PROVIDER_SITE_OTHER): Payer: Self-pay | Admitting: Orthopaedic Surgery

## 2018-02-17 DIAGNOSIS — G8929 Other chronic pain: Secondary | ICD-10-CM

## 2018-02-17 DIAGNOSIS — M545 Low back pain, unspecified: Secondary | ICD-10-CM

## 2018-02-17 NOTE — Progress Notes (Signed)
The patient is well-known to me.  She is been following up after hamstring injury unfettered physical therapy.  Physical therapy is work on hamstring but also her back.  She does use a rolling walker.  She is morbidly obese with a BMI of 49.  She does report continued low back pain.  She does not describe any sciatic symptoms at all.  She had an MRI which is on her system of her lumbar spine in 2014 when someone else is seeing her.  Then she had moderate facet arthritis at L5-S1 bilaterally but also spinal listhesis which is mild at L4-L5.  On exam her hamstrings is doing much better with less pain.  Start with putting both hips the range of motion.  Palpation lower lumbar spine more to the left and some to the right causes her pain.  There is no radicular component.  She understands that her significant obesity contributes to her back pain.  Therapy is certainly helping her.  She would like to try at least injections in her back.  She knows this may be difficult.  I do not feel like I need a repeat MRI since her old MRI 2014 can be seen.  I feel that she would potentially benefit if possible from bilateral L5-S1 facet joint injections versus L for L5.  She is interested in having this.  Therapy will continue her with her lumbar spine as well.  We will work on setting up the facet injections in her lumbar spine and I will see her back myself in 4 weeks.  All question concerns were answered and addressed.

## 2018-02-22 ENCOUNTER — Ambulatory Visit (INDEPENDENT_AMBULATORY_CARE_PROVIDER_SITE_OTHER): Payer: Medicare Other | Admitting: Nurse Practitioner

## 2018-02-22 ENCOUNTER — Encounter: Payer: Self-pay | Admitting: Nurse Practitioner

## 2018-02-22 VITALS — BP 122/74 | HR 91 | Temp 97.8°F

## 2018-02-22 DIAGNOSIS — E079 Disorder of thyroid, unspecified: Secondary | ICD-10-CM

## 2018-02-22 DIAGNOSIS — I10 Essential (primary) hypertension: Secondary | ICD-10-CM | POA: Diagnosis not present

## 2018-02-22 DIAGNOSIS — M79604 Pain in right leg: Secondary | ICD-10-CM

## 2018-02-22 DIAGNOSIS — E119 Type 2 diabetes mellitus without complications: Secondary | ICD-10-CM | POA: Diagnosis not present

## 2018-02-22 DIAGNOSIS — E785 Hyperlipidemia, unspecified: Secondary | ICD-10-CM

## 2018-02-22 DIAGNOSIS — L509 Urticaria, unspecified: Secondary | ICD-10-CM

## 2018-02-22 MED ORDER — LEVOTHYROXINE SODIUM 50 MCG PO TABS: 50.0000 ug | ORAL_TABLET | Freq: Every day | ORAL | 1 refills | Status: DC

## 2018-02-22 MED ORDER — METOPROLOL SUCCINATE ER 100 MG PO TB24: ORAL_TABLET | ORAL | 1 refills | Status: DC

## 2018-02-22 MED ORDER — SEMAGLUTIDE(0.25 OR 0.5MG/DOS) 2 MG/1.5ML ~~LOC~~ SOPN: 0.5000 mg | PEN_INJECTOR | SUBCUTANEOUS | 1 refills | Status: DC

## 2018-02-22 MED ORDER — KETOROLAC TROMETHAMINE 60 MG/2ML IM SOLN
60.0000 mg | Freq: Once | INTRAMUSCULAR | Status: AC
  Administered 2018-02-22: 60 mg via INTRAMUSCULAR

## 2018-02-22 MED ORDER — KETOROLAC TROMETHAMINE 60 MG/2ML IM SOLN: 60.0000 mg | Freq: Once | INTRAMUSCULAR | Status: DC

## 2018-02-22 NOTE — Progress Notes (Addendum)
Subjective:     Patient ID: [name] , female    DOB: 04-Jun-1940 , 77 y.o.   MRN: [id number]   Diabetes  She presents for her follow-up diabetic visit. She has type 2 diabetes mellitus. No MedicAlert identification noted. Her disease course has been stable. Pertinent negatives for diabetes include no blurred vision, no fatigue, no polydipsia and no polyphagia. There are no known risk factors for coronary artery disease. She is compliant with treatment all of the time. An ACE inhibitor/angiotensin II receptor blocker is being taken. She does not see a podiatrist.Eye exam is not current.  Leg Pain   The incident occurred 12 to 24 hours ago. The incident occurred at home (slid out of chair and hurt right leg). The pain is present in the right leg. The quality of the pain is described as aching and cramping. Pertinent negatives include no inability to bear weight.  Rash  This is a chronic problem. The current episode started more than 1 month ago. The problem has been gradually improving since onset. The affected locations include the right foot and left arm. Pertinent negatives include no fatigue. Past treatments include topical steroids.     Past Medical History:  Diagnosis Date  . Acute on chronic diastolic (congestive) heart failure ([place])   . Anemia    iron deficiency anemia - secondary to blood loss ( chronic)   . Arthritis    endstage changes bilateral knees/bilateral ankles.   . Asthma   . Carotid artery occlusion   . Chronic fatigue   . Chronic kidney disease   . Closed left hip fracture ([place])   . Clotting disorder ([place])    pt denies this  . Contusion of left knee    due to fall 1/14.  [name] COPD (chronic obstructive pulmonary disease) (HCC)    pulmonary fibrosis  . Depression, reactive   . Diabetes mellitus    type II   . Diastolic dysfunction   . Difficulty in walking   . Family history of heart disease   . Generalized muscle weakness   . Gout   . High cholesterol     . History of falling   . Hypertension   . Hypothyroidism   . Interstitial lung disease ([place])   . 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    . Morbid obesity ([place])   . Morbid obesity ([place])   . Neuromuscular disorder ([place])    diabetic neuropathy   . Normal coronary arteries    cardiac catheterization performed  10/31/14  . RA (rheumatoid arthritis) ([place])    has been off methotreaxte since 10/13.  [name] Rheumatoid arthritis ([place])   . Shortness of breath   . Spinal stenosis of lumbar region   . Thyroid disease   . Unspecified lack of coordination   . URI (upper respiratory infection)      Current Outpatient Medications:  .  acetaminophen (TYLENOL) 500 MG tablet, Take 1,000 mg by mouth every 6 (six) hours as needed for moderate pain. , Disp: , Rfl:  .  albuterol (PROVENTIL HFA;VENTOLIN HFA) 108 (90 BASE) MCG/ACT inhaler, Inhale 2 puffs into the lungs every 6 (six) hours as needed for wheezing or shortness of breath., Disp: , Rfl:  .  allopurinol (ZYLOPRIM) 300 MG tablet, Take 300 mg by mouth daily., Disp: , Rfl:  .  aspirin 325 MG tablet, Take 1 tablet (325 mg total)  by mouth daily., Disp: 60 tablet, Rfl: 0 .  atorvastatin (LIPITOR) 20 MG tablet, Take 10 mg by mouth every evening. , Disp: , Rfl:  .  azithromycin (ZITHROMAX) 500 MG tablet, Take 1 tablet by mouth daily. TAKE ON MON, WED & FRI, Disp: , Rfl: 0 .  Calcium Carb-Cholecalciferol (CALCIUM + D3 PO), Take 2 tablets by mouth daily with lunch. , Disp: , Rfl:  .  cholecalciferol (VITAMIN D) 1000 UNITS tablet, Take 2,000 Units by mouth daily with lunch. , Disp: , Rfl:  .  colchicine 0.6 MG tablet, Take 0.6 mg by mouth daily., Disp: , Rfl:  .  cyclobenzaprine (FLEXERIL) 5 MG tablet, Take 1 tablet (5 mg total) by mouth at bedtime as needed for muscle spasms., Disp: 90 tablet, Rfl: 3 .  diclofenac sodium (VOLTAREN) 1 % GEL, Apply 1 application topically  3 (three) times daily. Both hands and knees, Disp: 3 Tube, Rfl: 4 .  docusate sodium (COLACE) 100 MG capsule, Take 1 capsule (100 mg total) by mouth 2 (two) times daily., Disp: 10 capsule, Rfl: 0 .  DULoxetine (CYMBALTA) 60 MG capsule, Take 1 capsule (60 mg total) by mouth daily., Disp: 90 capsule, Rfl: 3 .  fluticasone (FLONASE) 50 MCG/ACT nasal spray, Place 2 sprays into both nostrils daily., Disp: 16 g, Rfl: 3 .  furosemide (LASIX) 80 MG tablet, Take 80 mg by mouth daily. , Disp: , Rfl: 1 .  gabapentin (NEURONTIN) 600 MG tablet, Take two in the morning , Two mid- afternoon and two at bedtime, Disp: 540 tablet, Rfl: 3 .  guaiFENesin (MUCINEX) 600 MG 12 hr tablet, Take 1,200 mg by mouth 2 (two) times daily., Disp: , Rfl:  .  hydroxychloroquine (PLAQUENIL) 200 MG tablet, Take 200 mg by mouth daily., Disp: , Rfl:  .  ipratropium-albuterol (DUONEB) 0.5-2.5 (3) MG/3ML SOLN, Take 3 mLs by nebulization. Every 6 hours as needed for cough or wheezing, Disp: , Rfl:  .  levothyroxine (SYNTHROID, LEVOTHROID) 50 MCG tablet, Take 1 tablet (50 mcg total) by mouth daily., Disp: 30 tablet, Rfl: 1 .  metoprolol succinate (TOPROL-XL) 50 MG 24 hr tablet, Take 1 tablet (50 mg total) by mouth every morning. Take with or immediately following a meal., Disp: , Rfl:  .  morphine (MS CONTIN) 15 MG 12 hr tablet, Take 1 tablet (15 mg total) by mouth every 12 (twelve) hours., Disp: 60 tablet, Rfl: 0 .  Multiple Vitamin (MULTIVITAMIN WITH MINERALS) TABS, Take 1 tablet by mouth daily. , Disp: , Rfl:  .  olmesartan (BENICAR) 20 MG tablet, Take 1 tablet (20 mg total) by mouth daily., Disp: , Rfl:  .  OXYGEN-HELIUM IN, Inhale 2 L into the lungs at bedtime. And on exertion, Disp: , Rfl:  .  pantoprazole (PROTONIX) 40 MG tablet, take 1 tablet by mouth once daily MINUTES BEFORE 1ST MEAL OF THE DAY, Disp: 90 tablet, Rfl: 0 .  polyethylene glycol (MIRALAX / GLYCOLAX) packet, Take 17 g by mouth daily., Disp: 14 each, Rfl: 0 .  potassium  chloride (K-DUR,KLOR-CON) 10 MEQ tablet, Take 20 mEq by mouth 2 (two) times daily., Disp: , Rfl:  .  PREDNISONE PO, Take 15 mg by mouth every morning. , Disp: , Rfl: 0 .  sodium chloride (OCEAN) 0.65 % SOLN nasal spray, Place 1 spray into both nostrils as needed for congestion., Disp: , Rfl:  .  Spacer/Aero-Holding Chambers (AEROCHAMBER MV) inhaler, Use as instructed, Disp: 1 each, Rfl: 0 .  Tofacitinib Citrate Morrie Sheldon)  5 MG TABS, Take 5 mg by mouth 2 (two) times daily., Disp: , Rfl:  .  VICTOZA 18 MG/3ML SOPN, Inject 1.8 mg as directed daily. , Disp: , Rfl: 0 .  allopurinol (ZYLOPRIM) 300 MG tablet, Take 300 mg by mouth daily. , Disp: , Rfl:    Review of Systems  Constitutional: Negative.  Negative for fatigue.  HENT: Negative.   Eyes: Negative for blurred vision.  Respiratory: Negative.   Cardiovascular: Negative.   Endocrine: Negative for polydipsia and polyphagia.  Musculoskeletal: Positive for arthralgias.       Left leg pain lower extremity  Skin: Positive for rash.  Neurological: Negative.      Today's Vitals   02/22/18 1202  BP: 122/74  Pulse: 91  Temp: 97.8 F (36.6 C)  TempSrc: Oral  SpO2: 94%  PainSc: 6   PainLoc: Leg   There is no height or weight on file to calculate BMI.   Objective:  Physical Exam  Constitutional: She is oriented to person, place, and time. She appears well-developed and well-nourished.  Morbid obesity  HENT:  Head: Normocephalic.  Cardiovascular: Normal rate, regular rhythm, normal heart sounds and intact distal pulses.  Pulmonary/Chest: Effort normal and breath sounds normal.  Musculoskeletal:       Right knee: She exhibits no erythema. Tenderness found.       Legs: Neurological: She is alert and oriented to person, place, and time.  Skin: Skin is warm and dry. Rash noted. Rash is urticarial.           Assessment And Plan:   1. Essential hypertension  Chronic, controlled  Continue with current medications - CBC no Diff -  CMP14 + Anion Gap - metoprolol succinate (TOPROL XL) 100 MG 24 hr tablet; Take 1/2 tablet by mouth daily with or immediately following a meal.  Dispense: 45 tablet; Refill: 1  2. Thyroid disease  Chronic, controlled  Continue with current medications - TSH - T3, free - T4 - levothyroxine (SYNTHROID, LEVOTHROID) 50 MCG tablet; Take 1 tablet (50 mcg total) by mouth daily.  Dispense: 30 tablet; Refill: 1  3. Leg pain, central, right  Occurred after slipping out of the rolling chair.  Likely inflammation  Advised to apply ice to area as needed.  - ketorolac (TORADOL) injection 60 mg  4. Dyslipidemia Chronic, controlled Continue with current medications - Lipid Profile  5. Type 2 diabetes mellitus without complication, without long-term current use of insulin (HCC)  Chronic, controlled  Continue with current medications - CMP14 + Anion Gap - Hemoglobin A1c - Semaglutide,0.25 or 0.5MG/DOS, (OZEMPIC, 0.25 OR 0.5 MG/DOSE,) 2 MG/1.5ML SOPN; Inject 0.5 mg into the skin once a week.  Dispense: 3 pen; Refill: 1    6. Urticaria  She is advised to use over the counter benadryl cream for the itching. To her lower extremities, likely due to the swelling.     Minette Brine, FNP

## 2018-02-23 ENCOUNTER — Encounter: Payer: Self-pay | Admitting: Physical Therapy

## 2018-02-23 ENCOUNTER — Ambulatory Visit: Payer: Medicare Other | Attending: Orthopaedic Surgery | Admitting: Physical Therapy

## 2018-02-23 DIAGNOSIS — G8929 Other chronic pain: Secondary | ICD-10-CM | POA: Diagnosis not present

## 2018-02-23 DIAGNOSIS — M545 Low back pain: Secondary | ICD-10-CM | POA: Insufficient documentation

## 2018-02-23 DIAGNOSIS — R2689 Other abnormalities of gait and mobility: Secondary | ICD-10-CM | POA: Diagnosis not present

## 2018-02-23 DIAGNOSIS — M79605 Pain in left leg: Secondary | ICD-10-CM | POA: Insufficient documentation

## 2018-02-23 LAB — CMP14 + ANION GAP
ALBUMIN: 3.9 g/dL (ref 3.5–4.8)
ALT: 40 IU/L — AB (ref 0–32)
AST: 21 IU/L (ref 0–40)
Albumin/Globulin Ratio: 1.9 (ref 1.2–2.2)
Alkaline Phosphatase: 69 IU/L (ref 39–117)
Anion Gap: 15 mmol/L (ref 10.0–18.0)
BILIRUBIN TOTAL: 0.7 mg/dL (ref 0.0–1.2)
BUN / CREAT RATIO: 13 (ref 12–28)
BUN: 16 mg/dL (ref 8–27)
CO2: 30 mmol/L — ABNORMAL HIGH (ref 20–29)
CREATININE: 1.19 mg/dL — AB (ref 0.57–1.00)
Calcium: 8.5 mg/dL — ABNORMAL LOW (ref 8.7–10.3)
Chloride: 103 mmol/L (ref 96–106)
GFR calc non Af Amer: 44 mL/min/{1.73_m2} — ABNORMAL LOW (ref >59)
GFR, EST AFRICAN AMERICAN: 51 mL/min/{1.73_m2} — AB (ref >59)
GLOBULIN, TOTAL: 2.1 g/dL (ref 1.5–4.5)
Glucose: 218 mg/dL — ABNORMAL HIGH (ref 65–99)
Potassium: 3.9 mmol/L (ref 3.5–5.2)
SODIUM: 148 mmol/L — AB (ref 134–144)
TOTAL PROTEIN: 6 g/dL (ref 6.0–8.5)

## 2018-02-23 LAB — HEMOGLOBIN A1C
Est. average glucose Bld gHb Est-mCnc: 137 mg/dL
Hgb A1c MFr Bld: 6.4 % — ABNORMAL HIGH (ref 4.8–5.6)

## 2018-02-23 LAB — CBC
Hematocrit: 35.9 % (ref 34.0–46.6)
Hemoglobin: 11.7 g/dL (ref 11.1–15.9)
MCH: 30.1 pg (ref 26.6–33.0)
MCHC: 32.6 g/dL (ref 31.5–35.7)
MCV: 92 fL (ref 79–97)
PLATELETS: 205 10*3/uL (ref 150–450)
RBC: 3.89 x10E6/uL (ref 3.77–5.28)
RDW: 14 % (ref 12.3–15.4)
WBC: 6.3 10*3/uL (ref 3.4–10.8)

## 2018-02-23 LAB — TSH: TSH: 2.86 u[IU]/mL (ref 0.450–4.500)

## 2018-02-23 LAB — LIPID PANEL
CHOL/HDL RATIO: 2.6 ratio (ref 0.0–4.4)
Cholesterol, Total: 152 mg/dL (ref 100–199)
HDL: 59 mg/dL (ref >39)
LDL CALC: 63 mg/dL (ref 0–99)
TRIGLYCERIDES: 150 mg/dL — AB (ref 0–149)
VLDL Cholesterol Cal: 30 mg/dL (ref 5–40)

## 2018-02-23 LAB — T4: T4, Total: 7 ug/dL (ref 4.5–12.0)

## 2018-02-23 LAB — T3, FREE: T3, Free: 2.5 pg/mL (ref 2.0–4.4)

## 2018-02-23 NOTE — Therapy (Signed)
[place], [place], [address] Phone: [phone]   Fax:  [phone]  Physical Therapy Treatment  Patient Details  Name: [name] MRN: [id number] Date of Birth: 1940/07/26 Referring Provider (PT): Zollie Beckers MD   Encounter Date: 02/23/2018  PT End of Session - 02/23/18 1356    Visit Number  7    Number of Visits  17    Date for PT Re-Evaluation  03/10/18    Authorization Type  medicare progress note at 10th visit and Kx mod by 15th visit    PT Start Time  1148    PT Stop Time  1231    PT Time Calculation (min)  43 min    Activity Tolerance  Patient tolerated treatment well    Behavior During Therapy  [facility] for tasks assessed/performed       Past Medical History:  Diagnosis Date  . Acute on chronic diastolic (congestive) heart failure ([place])   . Anemia    iron deficiency anemia - secondary to blood loss ( chronic)   . Arthritis    endstage changes bilateral knees/bilateral ankles.   . Asthma   . Carotid artery occlusion   . Chronic fatigue   . Chronic kidney disease   . Closed left hip fracture ([place])   . Clotting disorder ([place])    pt denies this  . Contusion of left knee    due to fall 1/14.  [name] COPD (chronic obstructive pulmonary disease) (HCC)    pulmonary fibrosis  . Depression, reactive   . Diabetes mellitus    type II   . Diastolic dysfunction   . Difficulty in walking   . Family history of heart disease   . Generalized muscle weakness   . Gout   . High cholesterol   . History of falling   . Hypertension   . Hypothyroidism   . Interstitial lung disease ([place])   . 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    . Morbid obesity ([place])   . Morbid obesity ([place])   . Neuromuscular disorder ([place])    diabetic neuropathy   . Normal coronary arteries    cardiac catheterization performed   10/31/14  . RA (rheumatoid arthritis) ([place])    has been off methotreaxte since 10/13.  [name] Rheumatoid arthritis ([place])   . Shortness of breath   . Spinal stenosis of lumbar region   . Thyroid disease   . Unspecified lack of coordination   . URI (upper respiratory infection)     Past Surgical History:  Procedure Laterality Date  . ABDOMINAL HYSTERECTOMY    . BRAIN SURGERY     Gamma knife 10/13. Needs repeat spring  '14  . CARDIAC CATHETERIZATION N/A 10/31/2014   Procedure: Right/Left Heart Cath and Coronary Angiography;  Surgeon: [name], MD;  Location: [facility];  Service: Cardiovascular;  Laterality: N/A;  . ESOPHAGOGASTRODUODENOSCOPY (EGD) WITH PROPOFOL N/A 09/14/2014   Procedure: ESOPHAGOGASTRODUODENOSCOPY (EGD) WITH PROPOFOL;  Surgeon: [name], MD;  Location: [facility];  Service: Endoscopy;  Laterality: N/A;  . INCISION AND DRAINAGE HIP Left 01/16/2017   Procedure: IRRIGATION AND DEBRIDEMENT LEFT HIP;  Surgeon: [name], MD;  Location: [facility];  Service: Orthopedics;  Laterality: Left;  . INTRAMEDULLARY (IM) NAIL INTERTROCHANTERIC Left 11/29/2016   Procedure: INTRAMEDULLARY (IM) NAIL INTERTROCHANTRIC;  Surgeon: [name], MD;  Location:  [facility];  Service: Orthopedics;  Laterality: Left;  . OVARY SURGERY    . SHOULDER SURGERY Left   . TONSILLECTOMY  age 77  . VIDEO BRONCHOSCOPY Bilateral 05/31/2013   Procedure: VIDEO BRONCHOSCOPY WITHOUT FLUORO;  Surgeon: Brand Males, MD;  Location: [facility];  Service: Cardiopulmonary;  Laterality: Bilateral;  . video bronscoscopy  2000   lung    There were no vitals filed for this visit.  Subjective Assessment - 02/23/18 1154    Subjective  Pt fell on Sunday trying to sit in wheeled computer chair. Pt saw Dr. Baird Cancer and recieved injection in right hip yesterday. Pt returns to day slower in ambulation on walker.  Mrs. Fukushima states she will no longer use weheeled seating.       Pertinent History  significant PMhx, currenlty on 2 LPM O2 Falls as per fall on 02-20-18    Limitations  Lifting;Standing;Walking;House hold activities    How long can you sit comfortably?  unlimited depending on temperature and chair surface    How long can you stand comfortably?  with RW 5 min  since fall     How long can you walk comfortably?  with RW 5-10  since fall    Diagnostic tests  x-ray 8/21    Patient Stated Goals  to be able to walk, and move without pain, get back to using a SPC    Currently in Pain?  Yes    Pain Score  3     Pain Location  Back    Pain Orientation  Left    Pain Descriptors / Indicators  Sore    Pain Onset  More than a month ago    Pain Frequency  Intermittent    Pain Score  4   right hip with injection 02-22-18   Pain Location  --    injection on right hip 02-22-18   Pain Orientation  Right;Left    Pain Descriptors / Indicators  Aching;Sore    Pain Type  Acute pain;Chronic pain    Pain Onset  More than a month ago    Pain Frequency  Intermittent         [facility] PT Assessment - 02/23/18 1153      Precautions   Precautions  Fall    Precaution Comments  Pt fell on Sunday 02-20-18. Pt slipped on computer chair with wheels. Pt will no longer use wheeled seating      Functional Tests   Functional tests  Sit to Stand      Sit to Stand   Comments  28.96 sec, 5 x STS                   [facility] Adult PT Treatment/Exercise - 02/23/18 1130      Self-Care   Self-Care  ADL's;Lifting;Posture    ADL's  educated on contents of handout for ADL    Lifting  verbally educated with verbal understanding of concepts    Posture  educated on siting, standing sit to stand      Exercises   Exercises  Knee/Hip;Lumbar      Knee/Hip Exercises: Standing   Hip Abduction  1 set;10 reps   at sink.  sore from fall on 02-21-18   Hip Extension  1 set;10 reps   at sink   Other Standing Knee Exercises  marching in RW 2 x 10     Other Standing Knee Exercises  sit to  stand x 5 , rest and then  5 x at sink    sore from fall over weekend     Knee/Hip Exercises: Seated   Knee/Hip Flexion  seated pelvic tilts 4 x 5 each with VC and TC      Knee/Hip Exercises: Supine   Knee Extension  Both   LAQ x 10 each     Moist Heat Therapy   Number Minutes Moist Heat  15 Minutes   concurrently with ADL body mechanic education   Moist Heat Location  Lumbar Spine             PT Education - 02/23/18 1215    Education Details  added seated pelvic tilts and edcuation verbally on ADL and posture handout    Person(s) Educated  Patient    Methods  Explanation;Demonstration;Verbal cues;Handout;Tactile cues    Comprehension  Verbalized understanding;Returned demonstration       PT Short Term Goals - 02/23/18 1402      PT SHORT TERM GOAL #1   Title  pt to be I with initial HEP     Baseline  Pt has initial HEP and needs reinforcement    Time  4    Period  Weeks    Status  On-going      PT SHORT TERM GOAL #2   Title  pt to verbalize/ demo proper posture and gait mechanics to prevent and reduce hip and low back pain     Baseline  Pt educated on posture, ADL and lifting and will continue to maximize gait    Time  4    Period  Weeks    Status  On-going      PT SHORT TERM GOAL #3   Title  pt to be able to stand / ambulate >/= 10 with LRAD with </= 6/10 pain to promote therapeutic progression     Baseline  Pt back 3/10 and right hip 4/10 post injections    Time  4    Period  Weeks    Status  On-going        PT Long Term Goals - 01/13/18 1712      PT LONG TERM GOAL #1   Title  pt to increase bil Le strength to >/= 4/5 to promote hip/ knee stability and maximzie safety with prolonged standing/ walking activity    Time  8    Period  Weeks    Status  New    Target Date  03/10/18      PT LONG TERM GOAL #2   Title  pt to be able to ambulate >/= 30 min with LRAD reporting </= 4/10 pain for functional endurance for community and in home ambulation    Time   8    Period  Weeks    Status  New    Target Date  03/10/18      PT LONG TERM GOAL #3   Title  pt increase trunk mobility by >/= 10 degrees in flexion and sidebending to with </= 3/10 pain to assist with functional mobility required for ADL s    Time  8    Period  Weeks    Status  New    Target Date  03/10/18      PT LONG TERM GOAL #4   Title  incresae FOTO score to </= 60% limited to demo improvement in function    Time  8    Period  Weeks    Status  New    Target Date  03/10/18      PT LONG TERM GOAL #5   Title  pt to be I with all HEP given as of last visit to maintain and progress current level of function     Time  8    Period  Weeks    Status  New    Target Date  03/10/18            Plan - 02/23/18 1356    Clinical Impression Statement  Pt  reports falling while trying to sit on rolling computer chair and fell 02-21-18. Mrs. Myren saw Dr Baird Cancer and recieved injection on right hip on 02-22-18 and reports being very sore today and hard to exercises. Pt was educated on ADL, posture and body mechanic with verbal retrun of understanding.  Pt also performed 5 x STS in 28 seconds and understands she is at high risk for another fall without strengthening.  Pt understands and will continue work in PT to strengthen.  Pt will no longer utilize rolling chairs in her home.    Rehab Potential  Good    PT Frequency  2x / week    PT Duration  8 weeks    PT Treatment/Interventions  ADLs/Self Care Home Management;Cryotherapy;Electrical Stimulation;Iontophoresis 69m/ml Dexamethasone;Moist Heat;Ultrasound;Balance training;Stair training;Functional mobility training;Therapeutic activities;Therapeutic exercise;Manual techniques;Taping;Dry needling;Gait training    PT Next Visit Plan  potentilal SIJ involvement on L, hamstring stretching gently, core activation, gait trainining and endurance, hip strengtheing, LAD to promote hip flexor activation.  fall prevention     PT Home Exercise Plan   lower trunk rotation, gentle hamstring stretching (sitting and supine), posterior pelvic tilt. supine marching. standing marching using RW sit to stand at sinnk, sitting pelovic tilt    Consulted and Agree with Plan of Care  Patient       Patient will benefit from skilled therapeutic intervention in order to improve the following deficits and impairments:  Abnormal gait, Pain, Decreased activity tolerance, Decreased balance, Postural dysfunction, Improper body mechanics, Decreased strength  Visit Diagnosis: Chronic bilateral low back pain without sciatica  Pain in left leg  Other abnormalities of gait and mobility     Problem List Patient Active Problem List   Diagnosis Date Noted  . Chronic bilateral low back pain without sciatica 02/17/2018  . Pain in left hip 02/18/2017  . Wound infection after surgery, initial encounter 01/16/2017  . Closed nondisplaced intertrochanteric fracture of left femur with delayed healing 01/14/2017  . Postoperative wound infection 01/14/2017  . Gout 11/29/2016  . Depression 11/29/2016  . Closed left hip fracture ([place] 11/29/2016  . GERD (gastroesophageal reflux disease) 11/29/2016  . Closed intertrochanteric fracture of hip, left, initial encounter ([place] 11/29/2016  . Physical deconditioning 07/31/2015  . Pulmonary fibrosis, postinflammatory ([place] 07/31/2015  . Tendinitis of left rotator cuff 06/13/2015  . Acute on chronic diastolic heart failure ([place] 04/21/2015  . Acute pulmonary edema (HCC)   . AKI (acute kidney injury) ([place]   . Peripheral edema   . Acute on chronic respiratory failure with hypoxia ([place] 04/17/2015  . CAP (community acquired pneumonia) 04/17/2015  . Acute kidney injury superimposed on chronic kidney disease ([place] 04/17/2015  . Hypotension 04/17/2015  . Chronic respiratory failure with hypoxia ([place] 02/09/2015  . Dyspnea and respiratory abnormality 01/03/2015  . Normal coronary arteries 11/02/2014  . Morbid obesity ([place]  10/30/2014  . Dysphagia 08/21/2014  . Sore throat 07/18/2014  . Chronic fatigue 06/21/2014  . DOE (dyspnea on exertion) 05/24/2014  . High risk  medication use 04/17/2014  . Aphasia 02/12/2014  . 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 Lumbar facet arthropathy 01/18/2014  . Acute bronchitis 07/24/2013  . Bilateral edema of lower extremity 05/30/2013  . Chronic cough 05/24/2013  . ILD (interstitial lung disease) ([place]) 04/10/2013  . Greater trochanteric bursitis of left hip 08/13/2012  . Spinal stenosis of lumbar region with radiculopathy 07/12/2012  . Inability to walk 07/12/2012  . Acute lumbar back pain 07/12/2012  . Asymptomatic bacteriuria 07/12/2012  . 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 ([place]) 02/17/2012  . Benign neoplasm of meninges ([place]) 02/12/2012  . Brain mass 01/28/2012  . Chest pain with moderate risk of acute coronary syndrome 01/28/2012  . Rheumatoid arthritis ([place]) 01/28/2012  . Primary osteoarthritis of right knee 01/28/2012  . Dyslipidemia   . Thyroid disease   . Essential hypertension    Voncille Lo, PT Certified Exercise Expert for the Aging Adult  02/23/18 2:06 PM Phone: [phone] Fax: [facility] [address] [place], [place], [address] Phone: [phone]   Fax:  [phone]  Name: AYIANNA DARNOLD MRN: [id number] Date of Birth: Aug 08, 1940

## 2018-02-23 NOTE — Patient Instructions (Signed)

## 2018-03-02 ENCOUNTER — Ambulatory Visit: Payer: Medicare Other | Admitting: Physical Therapy

## 2018-03-02 ENCOUNTER — Encounter: Payer: Self-pay | Admitting: Physical Therapy

## 2018-03-02 DIAGNOSIS — R2689 Other abnormalities of gait and mobility: Secondary | ICD-10-CM | POA: Diagnosis not present

## 2018-03-02 DIAGNOSIS — M545 Low back pain: Secondary | ICD-10-CM | POA: Diagnosis not present

## 2018-03-02 DIAGNOSIS — M79605 Pain in left leg: Secondary | ICD-10-CM

## 2018-03-02 DIAGNOSIS — G8929 Other chronic pain: Secondary | ICD-10-CM

## 2018-03-02 NOTE — Therapy (Signed)
[place], [place], [address] Phone: [phone]   Fax:  [phone]  Physical Therapy Treatment  Patient Details  Name: [name] MRN: [id number] Date of Birth: 16-Mar-1941 Referring Provider (PT): Zollie Beckers MD   Encounter Date: 03/02/2018  PT End of Session - 03/02/18 1147    Visit Number  8    Number of Visits  17    Date for PT Re-Evaluation  03/10/18    Authorization Type  medicare progress note at 10th visit and Kx mod by 15th visit    PT Start Time  1148    PT Stop Time  1231    PT Time Calculation (min)  43 min    Activity Tolerance  Patient tolerated treatment well    Behavior During Therapy  [facility] for tasks assessed/performed       Past Medical History:  Diagnosis Date  . Acute on chronic diastolic (congestive) heart failure ([place])   . Anemia    iron deficiency anemia - secondary to blood loss ( chronic)   . Arthritis    endstage changes bilateral knees/bilateral ankles.   . Asthma   . Carotid artery occlusion   . Chronic fatigue   . Chronic kidney disease   . Closed left hip fracture ([place])   . Clotting disorder ([place])    pt denies this  . Contusion of left knee    due to fall 1/14.  [name] COPD (chronic obstructive pulmonary disease) (HCC)    pulmonary fibrosis  . Depression, reactive   . Diabetes mellitus    type II   . Diastolic dysfunction   . Difficulty in walking   . Family history of heart disease   . Generalized muscle weakness   . Gout   . High cholesterol   . History of falling   . Hypertension   . Hypothyroidism   . Interstitial lung disease ([place])   . 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    . Morbid obesity ([place])   . Morbid obesity ([place])   . Neuromuscular disorder ([place])    diabetic neuropathy   . Normal coronary arteries    cardiac catheterization performed   10/31/14  . RA (rheumatoid arthritis) ([place])    has been off methotreaxte since 10/13.  [name] Rheumatoid arthritis ([place])   . Shortness of breath   . Spinal stenosis of lumbar region   . Thyroid disease   . Unspecified lack of coordination   . URI (upper respiratory infection)     Past Surgical History:  Procedure Laterality Date  . ABDOMINAL HYSTERECTOMY    . BRAIN SURGERY     Gamma knife 10/13. Needs repeat spring  '14  . CARDIAC CATHETERIZATION N/A 10/31/2014   Procedure: Right/Left Heart Cath and Coronary Angiography;  Surgeon: [name], MD;  Location: [facility];  Service: Cardiovascular;  Laterality: N/A;  . ESOPHAGOGASTRODUODENOSCOPY (EGD) WITH PROPOFOL N/A 09/14/2014   Procedure: ESOPHAGOGASTRODUODENOSCOPY (EGD) WITH PROPOFOL;  Surgeon: [name], MD;  Location: [facility];  Service: Endoscopy;  Laterality: N/A;  . INCISION AND DRAINAGE HIP Left 01/16/2017   Procedure: IRRIGATION AND DEBRIDEMENT LEFT HIP;  Surgeon: [name], MD;  Location: [facility];  Service: Orthopedics;  Laterality: Left;  . INTRAMEDULLARY (IM) NAIL INTERTROCHANTERIC Left 11/29/2016   Procedure: INTRAMEDULLARY (IM) NAIL INTERTROCHANTRIC;  Surgeon: [name], MD;  Location:  [facility];  Service: Orthopedics;  Laterality: Left;  . OVARY SURGERY    . SHOULDER SURGERY Left   . TONSILLECTOMY  age 16  . VIDEO BRONCHOSCOPY Bilateral 05/31/2013   Procedure: VIDEO BRONCHOSCOPY WITHOUT FLUORO;  Surgeon: Brand Males, MD;  Location: [facility];  Service: Cardiopulmonary;  Laterality: Bilateral;  . video bronscoscopy  2000   lung    There were no vitals filed for this visit.  Subjective Assessment - 03/02/18 1147    Subjective  "I am feeling sore in my R hamstring and leg it feels just really tight in the back of my knee"     Patient Stated Goals  to be able to walk, and move without pain, get back to using a SPC    Currently in Pain?  Yes    Pain Score  4     Pain  Location  Leg    Pain Orientation  Right    Pain Type  Chronic pain    Pain Onset  More than a month ago    Pain Frequency  Intermittent    Aggravating Factors   prolonged standing/ walking    Pain Relieving Factors  resting, medication    Pain Score  2    Pain Location  Leg    Pain Orientation  Left    Pain Descriptors / Indicators  Sore    Pain Type  Chronic pain    Pain Onset  More than a month ago    Pain Frequency  Intermittent    Aggravating Factors   standing/ walking    Pain Relieving Factors  medication                       [facility] Adult PT Treatment/Exercise - 03/02/18 1217      Exercises   Exercises  Knee/Hip;Lumbar      Knee/Hip Exercises: Stretches   Active Hamstring Stretch  2 reps;30 seconds;Both      Knee/Hip Exercises: Aerobic   Nustep  L5 x 7 min UE/LE      Knee/Hip Exercises: Seated   Long Arc Quad  Strengthening;Both;2 sets;20 reps;Weights    Long Arc Quad Weight  3 lbs.    Marching  1 set;Both;Strengthening;Weights    Marching Limitations  3    Sit to Sand  1 set;without UE support;15 reps      Manual Therapy   Manual Therapy  Soft tissue mobilization    Manual therapy comments  MTPR along distal semi-tendinosus     Soft tissue mobilization  IASTM along patellat tendon, distal semi-tendinosus          Balance Exercises - 03/02/18 1221      Balance Exercises: Standing   Standing Eyes Opened  2 reps;30 secs;Narrow base of support (BOS)    Standing Eyes Closed  2 reps;30 secs;Narrow base of support (BOS)        PT Education - 03/02/18 1235    Education Details  anatomy of the hamstrings and effect of the fall likely getting involved with the fall.     Person(s) Educated  Patient    Methods  Explanation;Verbal cues    Comprehension  Verbalized understanding;Verbal cues required       PT Short Term Goals - 02/23/18 1402      PT SHORT TERM GOAL #1   Title  pt to be I with initial HEP     Baseline  Pt has initial HEP and  needs reinforcement  Time  4    Period  Weeks    Status  On-going      PT SHORT TERM GOAL #2   Title  pt to verbalize/ demo proper posture and gait mechanics to prevent and reduce hip and low back pain     Baseline  Pt educated on posture, ADL and lifting and will continue to maximize gait    Time  4    Period  Weeks    Status  On-going      PT SHORT TERM GOAL #3   Title  pt to be able to stand / ambulate >/= 10 with LRAD with </= 6/10 pain to promote therapeutic progression     Baseline  Pt back 3/10 and right hip 4/10 post injections    Time  4    Period  Weeks    Status  On-going        PT Long Term Goals - 01/13/18 1712      PT LONG TERM GOAL #1   Title  pt to increase bil Le strength to >/= 4/5 to promote hip/ knee stability and maximzie safety with prolonged standing/ walking activity    Time  8    Period  Weeks    Status  New    Target Date  03/10/18      PT LONG TERM GOAL #2   Title  pt to be able to ambulate >/= 30 min with LRAD reporting </= 4/10 pain for functional endurance for community and in home ambulation    Time  8    Period  Weeks    Status  New    Target Date  03/10/18      PT LONG TERM GOAL #3   Title  pt increase trunk mobility by >/= 10 degrees in flexion and sidebending to with </= 3/10 pain to assist with functional mobility required for ADL s    Time  8    Period  Weeks    Status  New    Target Date  03/10/18      PT LONG TERM GOAL #4   Title  incresae FOTO score to </= 60% limited to demo improvement in function    Time  8    Period  Weeks    Status  New    Target Date  03/10/18      PT LONG TERM GOAL #5   Title  pt to be I with all HEP given as of last visit to maintain and progress current level of function     Time  8    Period  Weeks    Status  New    Target Date  03/10/18            Plan - 03/02/18 1236    Clinical Impression Statement  [name] reported increased soreness in the R knee likley along the  semi-tendinosus. following manual trigger point release and stretching she reported relief of pain and tightness and was able to do all exercises today .began balance training which she performed well but farigues with standing in place for long periods of time.     PT Next Visit Plan  potentilal SIJ involvement on L, hamstring stretching gently, core activation, gait trainining and endurance, hip strengtheing, LAD to promote hip flexor activation.  balance training: if she does well may add corner balance to HEP    PT Home Exercise Plan  lower trunk rotation, gentle hamstring stretching (sitting  and supine), posterior pelvic tilt. supine marching. standing marching using RW sit to stand at sinnk, sitting pelovic tilt    Consulted and Agree with Plan of Care  Patient       Patient will benefit from skilled therapeutic intervention in order to improve the following deficits and impairments:  Abnormal gait, Pain, Decreased activity tolerance, Decreased balance, Postural dysfunction, Improper body mechanics, Decreased strength  Visit Diagnosis: Chronic bilateral low back pain without sciatica  Pain in left leg  Other abnormalities of gait and mobility     Problem List Patient Active Problem List   Diagnosis Date Noted  . Chronic bilateral low back pain without sciatica 02/17/2018  . Pain in left hip 02/18/2017  . Wound infection after surgery, initial encounter 01/16/2017  . Closed nondisplaced intertrochanteric fracture of left femur with delayed healing 01/14/2017  . Postoperative wound infection 01/14/2017  . Gout 11/29/2016  . Depression 11/29/2016  . Closed left hip fracture ([place]) 11/29/2016  . GERD (gastroesophageal reflux disease) 11/29/2016  . Closed intertrochanteric fracture of hip, left, initial encounter ([name]) 11/29/2016  . Physical deconditioning 07/31/2015  . Pulmonary fibrosis, postinflammatory ([place]) 07/31/2015  . Tendinitis of left rotator cuff 06/13/2015  . Acute on  chronic diastolic heart failure ([place]) 04/21/2015  . Acute pulmonary edema (HCC)   . AKI (acute kidney injury) ([place])   . Peripheral edema   . Acute on chronic respiratory failure with hypoxia ([place]) 04/17/2015  . CAP (community acquired pneumonia) 04/17/2015  . Acute kidney injury superimposed on chronic kidney disease ([place]) 04/17/2015  . Hypotension 04/17/2015  . Chronic respiratory failure with hypoxia ([place]) 02/09/2015  . Dyspnea and respiratory abnormality 01/03/2015  . Normal coronary arteries 11/02/2014  . Morbid obesity ([place]) 10/30/2014  . Dysphagia 08/21/2014  . Sore throat 07/18/2014  . Chronic fatigue 06/21/2014  . DOE (dyspnea on exertion) 05/24/2014  . High risk medication use 04/17/2014  . Aphasia 02/12/2014  . 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 Lumbar facet arthropathy 01/18/2014  . Acute bronchitis 07/24/2013  . Bilateral edema of lower extremity 05/30/2013  . Chronic cough 05/24/2013  . ILD (interstitial lung disease) ([place]) 04/10/2013  . Greater trochanteric bursitis of left hip 08/13/2012  . Spinal stenosis of lumbar region with radiculopathy 07/12/2012  . Inability to walk 07/12/2012  . Acute lumbar back pain 07/12/2012  . Asymptomatic bacteriuria 07/12/2012  . 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 ([place]) 02/17/2012  . Benign neoplasm of meninges ([place]) 02/12/2012  . Brain mass 01/28/2012  . Chest pain with moderate risk of acute coronary syndrome 01/28/2012  . Rheumatoid arthritis ([place]) 01/28/2012  . Primary osteoarthritis of right knee 01/28/2012  . Dyslipidemia   . Thyroid disease   . Essential hypertension    Starr Lake PT, DPT, LAT, ATC  03/02/18  12:39 PM      [facility] [address] [place], [place], [address] Phone: [phone]   Fax:  [phone]  Name: [name] MRN: [id number] Date of Birth: October 28, 1940

## 2018-03-05 ENCOUNTER — Telehealth: Payer: Self-pay | Admitting: Physical Therapy

## 2018-03-05 ENCOUNTER — Telehealth: Payer: Self-pay | Admitting: Internal Medicine

## 2018-03-05 ENCOUNTER — Ambulatory Visit: Payer: Medicare Other | Admitting: Physical Therapy

## 2018-03-05 NOTE — Telephone Encounter (Signed)
I left a message letting the patient know that her yearly Medicare questionnaire has been scheduled on 04/21/18. VDM (DD)

## 2018-03-05 NOTE — Telephone Encounter (Signed)
Called and left message regarding pt missing todays appointment, reminder her of mondays appointment and asked her to call if there were any problems

## 2018-03-05 NOTE — Progress Notes (Signed)
Yes continue the cholesterol medication. Okay about the blood transfusions, actually her Hgb now is 11.7 this is good.

## 2018-03-08 ENCOUNTER — Ambulatory Visit (INDEPENDENT_AMBULATORY_CARE_PROVIDER_SITE_OTHER): Payer: Medicare Other | Admitting: Physical Medicine and Rehabilitation

## 2018-03-08 ENCOUNTER — Encounter (INDEPENDENT_AMBULATORY_CARE_PROVIDER_SITE_OTHER): Payer: Self-pay | Admitting: Physical Medicine and Rehabilitation

## 2018-03-08 ENCOUNTER — Ambulatory Visit: Payer: Medicare Other | Admitting: Physical Therapy

## 2018-03-08 ENCOUNTER — Ambulatory Visit (INDEPENDENT_AMBULATORY_CARE_PROVIDER_SITE_OTHER): Payer: Self-pay

## 2018-03-08 VITALS — BP 120/74 | HR 101 | Temp 97.7°F

## 2018-03-08 DIAGNOSIS — M47816 Spondylosis without myelopathy or radiculopathy, lumbar region: Secondary | ICD-10-CM

## 2018-03-08 DIAGNOSIS — M545 Low back pain, unspecified: Secondary | ICD-10-CM

## 2018-03-08 DIAGNOSIS — G894 Chronic pain syndrome: Secondary | ICD-10-CM

## 2018-03-08 DIAGNOSIS — J9611 Chronic respiratory failure with hypoxia: Secondary | ICD-10-CM

## 2018-03-08 DIAGNOSIS — G8929 Other chronic pain: Secondary | ICD-10-CM

## 2018-03-08 MED ORDER — METHYLPREDNISOLONE ACETATE 80 MG/ML IJ SUSP
80.0000 mg | Freq: Once | INTRAMUSCULAR | Status: AC
  Administered 2018-03-08: 80 mg

## 2018-03-08 NOTE — Progress Notes (Signed)
Numeric Pain Rating Scale and Functional Assessment Average Pain 8   In the last MONTH (on 0-10 scale) has pain interfered with the following?  1. General activity like being  able to carry out your everyday physical activities such as walking, climbing stairs, carrying groceries, or moving a chair?  Rating(10)    +Driver, -BT, -Dye Allergies.

## 2018-03-08 NOTE — Patient Instructions (Signed)

## 2018-03-09 ENCOUNTER — Telehealth: Payer: Self-pay | Admitting: *Deleted

## 2018-03-09 NOTE — Telephone Encounter (Signed)
Contacted patient to arrange a home visit. Visit scheduled for Wednesday 03/17/18 at [facility].

## 2018-03-10 ENCOUNTER — Encounter: Payer: Self-pay | Admitting: Physical Therapy

## 2018-03-10 ENCOUNTER — Ambulatory Visit: Payer: Medicare Other | Admitting: Physical Therapy

## 2018-03-10 DIAGNOSIS — R2689 Other abnormalities of gait and mobility: Secondary | ICD-10-CM

## 2018-03-10 DIAGNOSIS — M545 Low back pain: Secondary | ICD-10-CM | POA: Diagnosis not present

## 2018-03-10 DIAGNOSIS — G8929 Other chronic pain: Secondary | ICD-10-CM

## 2018-03-10 DIAGNOSIS — M79605 Pain in left leg: Secondary | ICD-10-CM | POA: Diagnosis not present

## 2018-03-10 NOTE — Therapy (Signed)
[place], [place], [address] Phone: [phone]   Fax:  [phone]  Physical Therapy Treatment / Re-certification  Patient Details  Name: [name] MRN: [id number] Date of Birth: 1940-12-07 Referring Provider (PT): Zollie Beckers MD   Encounter Date: 03/10/2018  PT End of Session - 03/10/18 1148    Visit Number  9    Number of Visits  17    Date for PT Re-Evaluation  03/10/18    Authorization Type  medicare progress note at 10th visit and Kx mod by 15th visit    PT Start Time  1148    PT Stop Time  1231    PT Time Calculation (min)  43 min    Activity Tolerance  Patient tolerated treatment well    Behavior During Therapy  [facility] for tasks assessed/performed       Past Medical History:  Diagnosis Date  . Acute on chronic diastolic (congestive) heart failure ([place])   . Anemia    iron deficiency anemia - secondary to blood loss ( chronic)   . Arthritis    endstage changes bilateral knees/bilateral ankles.   . Asthma   . Carotid artery occlusion   . Chronic fatigue   . Chronic kidney disease   . Closed left hip fracture ([place])   . Clotting disorder ([place])    pt denies this  . Contusion of left knee    due to fall 1/14.  [name] COPD (chronic obstructive pulmonary disease) (HCC)    pulmonary fibrosis  . Depression, reactive   . Diabetes mellitus    type II   . Diastolic dysfunction   . Difficulty in walking   . Family history of heart disease   . Generalized muscle weakness   . Gout   . High cholesterol   . History of falling   . Hypertension   . Hypothyroidism   . Interstitial lung disease ([place])   . 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    . Morbid obesity ([place])   . Morbid obesity ([place])   . Neuromuscular disorder ([place])    diabetic neuropathy   . Normal coronary arteries    cardiac  catheterization performed  10/31/14  . RA (rheumatoid arthritis) ([place])    has been off methotreaxte since 10/13.  [name] Rheumatoid arthritis ([place])   . Shortness of breath   . Spinal stenosis of lumbar region   . Thyroid disease   . Unspecified lack of coordination   . URI (upper respiratory infection)     Past Surgical History:  Procedure Laterality Date  . ABDOMINAL HYSTERECTOMY    . BRAIN SURGERY     Gamma knife 10/13. Needs repeat spring  '14  . CARDIAC CATHETERIZATION N/A 10/31/2014   Procedure: Right/Left Heart Cath and Coronary Angiography;  Surgeon: [name], MD;  Location: [place] [facility];  Service: Cardiovascular;  Laterality: N/A;  . ESOPHAGOGASTRODUODENOSCOPY (EGD) WITH PROPOFOL N/A 09/14/2014   Procedure: ESOPHAGOGASTRODUODENOSCOPY (EGD) WITH PROPOFOL;  Surgeon: [name], MD;  [facility];  Service: Endoscopy;  Laterality: N/A;  . INCISION AND DRAINAGE HIP Left 01/16/2017   Procedure: IRRIGATION AND DEBRIDEMENT LEFT HIP;  Surgeon: [name], MD;  Location: [facility];  Service: Orthopedics;  Laterality: Left;  . INTRAMEDULLARY (IM) NAIL INTERTROCHANTERIC Left 11/29/2016   Procedure: INTRAMEDULLARY (IM) NAIL INTERTROCHANTRIC;  Surgeon: [name], MD;  Location: [place];  Service: Orthopedics;  Laterality: Left;  . OVARY SURGERY    . SHOULDER SURGERY Left   . TONSILLECTOMY  age 77  . VIDEO BRONCHOSCOPY Bilateral 05/31/2013   Procedure: VIDEO BRONCHOSCOPY WITHOUT FLUORO;  Surgeon: Brand Males, MD;  Location: [place];  Service: Cardiopulmonary;  Laterality: Bilateral;  . video bronscoscopy  2000   lung    There were no vitals filed for this visit.  Subjective Assessment - 03/10/18 1149    Subjective  "I am feeling better tdoay, My back was bothering until I got injections in the back which have helped today"     Currently in Pain?  Yes    Pain Score  4     Pain Orientation  Right    Pain Descriptors / Indicators   Sore;Aching    Pain Type  Chronic pain    Pain Onset  More than a month ago    Pain Frequency  Intermittent    Aggravating Factors   prolonged standing/ walking    Pain Relieving Factors  resting, medication    Pain Score  4    Pain Location  Leg    Pain Orientation  Left    Pain Descriptors / Indicators  Aching;Sore    Pain Type  Chronic pain    Pain Onset  More than a month ago    Pain Frequency  Intermittent    Aggravating Factors   standing/ walking          [facility] PT Assessment - 03/10/18 0001      Observation/Other Assessments   Observations  increased swelling in the R ankle noted compared bil    Focus on Therapeutic Outcomes (FOTO)   75% limited      AROM   Lumbar Flexion  60    Lumbar Extension  20    Lumbar - Right Side Bend  10    Lumbar - Left Side Bend  10      Strength   Right Hip Flexion  4+/5    Right Hip Extension  3+/5    Right Hip ABduction  3+/5    Right Hip ADduction  4-/5    Left Hip Flexion  4/5    Left Hip Extension  3+/5    Left Hip ABduction  3+/5    Left Hip ADduction  4-/5    Right Knee Flexion  4-/5    Right Knee Extension  4/5    Left Knee Flexion  4-/5    Left Knee Extension  5/5      Palpation   Palpation comment  TTP along the pes anserine and semi-tendinosus                   [facility] Adult PT Treatment/Exercise - 03/10/18 1156      Exercises   Exercises  Knee/Hip;Lumbar      Knee/Hip Exercises: Stretches   Active Hamstring Stretch  2 reps;30 seconds      Knee/Hip Exercises: Aerobic   Stationary Bike  L5 x 8 min UE/LE   continuing to work on endurance.      Knee/Hip Exercises: Seated   Long Arc Quad  Strengthening;2 sets;10 reps    Long Arc Quad Weight  4 lbs.    Marching  2 sets;10 reps    Marching Limitations  4    Abduction/Adduction   Strengthening;2 sets;20 reps    Abd/Adduction Limitations  using red theraband    Sit to [employer]  1 set             PT Education - 03/10/18 1237    Education Details   reviewed assessment compared to inital measures. reviewed previously provided HEP and anatomy of the area involved. If no more progress is made over the course of the next few weeks then at that point we will have refer her back to discharge.     Person(s) Educated  Patient    Methods  Explanation;Verbal cues    Comprehension  Verbalized understanding;Verbal cues required       PT Short Term Goals - 03/10/18 1215      PT SHORT TERM GOAL #1   Title  pt to be I with initial HEP     Time  4    Period  Weeks    Status  Achieved      PT SHORT TERM GOAL #2   Title  pt to verbalize/ demo proper posture and gait mechanics to prevent and reduce hip and low back pain     Baseline  has practiced but continues require cues    Period  Weeks    Status  On-going      PT SHORT TERM GOAL #3   Title  pt to be able to stand / ambulate >/= 10 with LRAD with </= 6/10 pain to promote therapeutic progression     Baseline  able to walk 10 min but reports pain increases to 6-7/10    Time  4    Period  Weeks    Status  On-going        PT Long Term Goals - 03/10/18 1216      PT LONG TERM GOAL #1   Title  pt to increase bil Le strength to >/= 4/5 to promote hip/ knee stability and maximzie safety with prolonged standing/ walking activity    Time  8    Period  Weeks    Status  On-going    Target Date  04/07/18      PT LONG TERM GOAL #2   Title  pt to be able to ambulate >/= 30 min with LRAD reporting </= 4/10 pain for functional endurance for community and in home ambulation    Time  8    Period  Weeks    Status  On-going    Target Date  04/07/18      PT LONG TERM GOAL #3   Title  pt increase trunk mobility by >/= 10 degrees in flexion and sidebending to with </= 3/10 pain to assist with functional mobility required for ADL s    Time  8    Period  Weeks    Status  On-going    Target Date  04/07/18      PT LONG TERM GOAL #4   Title  incresae FOTO score to </= 60% limited to demo improvement  in function    Baseline  25% limited on FOTO     Period  Weeks    Status  On-going    Target Date  04/07/18      PT LONG TERM GOAL #5   Title  pt to be I with all HEP given as of last visit to maintain and progress current level of function     Baseline  progress with HEP as able.     Time  8    Period  Weeks    Status  On-going    Target Date  04/07/18  Plan - 03/10/18 1238    Clinical Impression Statement  [name] has made progress with trunk mobility and hip/ knee strength. She continues to report pain in the medial aspect of the R knee and low back, which she notes could be related to her fall out of her desk chair a 2 weeks ago.she is making progress with goals meeting with STG #1 and is working toward remaining STG and LTG. continued hip / knee strengthening today which she noted improvement of pain. she would benefit from continued physical therapy to increase hip/ knee strength, promote functional endurnace, gait mechanics, and maximize her function.     Rehab Potential  Good    PT Frequency  2x / week    PT Duration  4 weeks    PT Treatment/Interventions  ADLs/Self Care Home Management;Cryotherapy;Electrical Stimulation;Iontophoresis 52m/ml Dexamethasone;Moist Heat;Ultrasound;Balance training;Stair training;Functional mobility training;Therapeutic activities;Therapeutic exercise;Manual techniques;Taping;Dry needling;Gait training    PT Next Visit Plan  potentilal SIJ involvement on L, hamstring stretching gently, core activation, gait trainining and endurance, hip strengtheing, LAD to promote hip flexor activation.  balance training: if she does well may add corner balance to HEP    PT Home Exercise Plan  lower trunk rotation, gentle hamstring stretching (sitting and supine), posterior pelvic tilt. supine marching. standing marching using RW sit to stand at sinnk, sitting pelovic tilt    Consulted and Agree with Plan of Care  Patient       Patient will benefit  from skilled therapeutic intervention in order to improve the following deficits and impairments:  Abnormal gait, Pain, Decreased activity tolerance, Decreased balance, Postural dysfunction, Improper body mechanics, Decreased strength  Visit Diagnosis: Chronic bilateral low back pain without sciatica  Pain in left leg  Other abnormalities of gait and mobility     Problem List Patient Active Problem List   Diagnosis Date Noted  . Chronic bilateral low back pain without sciatica 02/17/2018  . Pain in left hip 02/18/2017  . Wound infection after surgery, initial encounter 01/16/2017  . Closed nondisplaced intertrochanteric fracture of left femur with delayed healing 01/14/2017  . Postoperative wound infection 01/14/2017  . Gout 11/29/2016  . Depression 11/29/2016  . Closed left hip fracture ([place] 11/29/2016  . GERD (gastroesophageal reflux disease) 11/29/2016  . Closed intertrochanteric fracture of hip, left, initial encounter ([facility] 11/29/2016  . Physical deconditioning 07/31/2015  . Pulmonary fibrosis, postinflammatory ([place] 07/31/2015  . Tendinitis of left rotator cuff 06/13/2015  . Acute on chronic diastolic heart failure ([place] 04/21/2015  . Acute pulmonary edema (HCC)   . AKI (acute kidney injury) ([place]   . Peripheral edema   . Acute on chronic respiratory failure with hypoxia ([place] 04/17/2015  . CAP (community acquired pneumonia) 04/17/2015  . Acute kidney injury superimposed on chronic kidney disease ([place] 04/17/2015  . Hypotension 04/17/2015  . Chronic respiratory failure with hypoxia ([place] 02/09/2015  . Dyspnea and respiratory abnormality 01/03/2015  . Normal coronary arteries 11/02/2014  . Morbid obesity ([facility] 10/30/2014  . Dysphagia 08/21/2014  . Sore throat 07/18/2014  . Chronic fatigue 06/21/2014  . DOE (dyspnea on exertion) 05/24/2014  . High risk medication use 04/17/2014  . Aphasia 02/12/2014  . 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 Lumbar facet arthropathy 01/18/2014  . Acute bronchitis 07/24/2013  . Bilateral edema of lower extremity 05/30/2013  . Chronic cough 05/24/2013  . ILD (interstitial lung disease) ([place] 04/10/2013  . Greater trochanteric bursitis of left hip 08/13/2012  . Spinal  stenosis of lumbar region with radiculopathy 07/12/2012  . Inability to walk 07/12/2012  . Acute lumbar back pain 07/12/2012  . Asymptomatic bacteriuria 07/12/2012  . 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 ([place]) 02/17/2012  . Benign neoplasm of meninges ([place]) 02/12/2012  . Brain mass 01/28/2012  . Chest pain with moderate risk of acute coronary syndrome 01/28/2012  . Rheumatoid arthritis ([place]) 01/28/2012  . Primary osteoarthritis of right knee 01/28/2012  . Dyslipidemia   . Thyroid disease   . Essential hypertension    Starr Lake PT, DPT, LAT, ATC  03/10/18  12:44 PM      [facility] [address] [place], [place], [address] Phone: [phone]   Fax:  [phone]  Name: [name] MRN: [id number] Date of Birth: 1940/06/21

## 2018-03-11 ENCOUNTER — Encounter: Payer: Medicare Other | Attending: Physical Medicine & Rehabilitation | Admitting: Registered Nurse

## 2018-03-11 ENCOUNTER — Ambulatory Visit: Payer: Medicare Other | Admitting: Physical Therapy

## 2018-03-11 ENCOUNTER — Encounter: Payer: Self-pay | Admitting: Registered Nurse

## 2018-03-11 ENCOUNTER — Encounter: Payer: Self-pay | Admitting: Physical Therapy

## 2018-03-11 VITALS — BP 139/87 | HR 91 | Ht 64.0 in | Wt 266.0 lb

## 2018-03-11 DIAGNOSIS — M545 Low back pain: Secondary | ICD-10-CM | POA: Diagnosis not present

## 2018-03-11 DIAGNOSIS — Z79899 Other long term (current) drug therapy: Secondary | ICD-10-CM

## 2018-03-11 DIAGNOSIS — M5416 Radiculopathy, lumbar region: Secondary | ICD-10-CM

## 2018-03-11 DIAGNOSIS — Z90710 Acquired absence of both cervix and uterus: Secondary | ICD-10-CM | POA: Diagnosis not present

## 2018-03-11 DIAGNOSIS — R2689 Other abnormalities of gait and mobility: Secondary | ICD-10-CM | POA: Diagnosis not present

## 2018-03-11 DIAGNOSIS — M79605 Pain in left leg: Secondary | ICD-10-CM | POA: Diagnosis not present

## 2018-03-11 DIAGNOSIS — M7061 Trochanteric bursitis, right hip: Secondary | ICD-10-CM

## 2018-03-11 DIAGNOSIS — M48062 Spinal stenosis, lumbar region with neurogenic claudication: Secondary | ICD-10-CM | POA: Diagnosis not present

## 2018-03-11 DIAGNOSIS — M2550 Pain in unspecified joint: Secondary | ICD-10-CM | POA: Diagnosis not present

## 2018-03-11 DIAGNOSIS — G894 Chronic pain syndrome: Secondary | ICD-10-CM

## 2018-03-11 DIAGNOSIS — F329 Major depressive disorder, single episode, unspecified: Secondary | ICD-10-CM | POA: Insufficient documentation

## 2018-03-11 DIAGNOSIS — M62838 Other muscle spasm: Secondary | ICD-10-CM

## 2018-03-11 DIAGNOSIS — M1712 Unilateral primary osteoarthritis, left knee: Secondary | ICD-10-CM | POA: Diagnosis not present

## 2018-03-11 DIAGNOSIS — M7062 Trochanteric bursitis, left hip: Secondary | ICD-10-CM | POA: Diagnosis not present

## 2018-03-11 DIAGNOSIS — M47816 Spondylosis without myelopathy or radiculopathy, lumbar region: Secondary | ICD-10-CM

## 2018-03-11 DIAGNOSIS — Z5181 Encounter for therapeutic drug level monitoring: Secondary | ICD-10-CM | POA: Diagnosis not present

## 2018-03-11 DIAGNOSIS — E1142 Type 2 diabetes mellitus with diabetic polyneuropathy: Secondary | ICD-10-CM | POA: Insufficient documentation

## 2018-03-11 DIAGNOSIS — M069 Rheumatoid arthritis, unspecified: Secondary | ICD-10-CM | POA: Diagnosis not present

## 2018-03-11 DIAGNOSIS — Z9181 History of falling: Secondary | ICD-10-CM | POA: Diagnosis not present

## 2018-03-11 DIAGNOSIS — E1122 Type 2 diabetes mellitus with diabetic chronic kidney disease: Secondary | ICD-10-CM | POA: Diagnosis not present

## 2018-03-11 DIAGNOSIS — G8929 Other chronic pain: Secondary | ICD-10-CM | POA: Diagnosis not present

## 2018-03-11 DIAGNOSIS — Z9889 Other specified postprocedural states: Secondary | ICD-10-CM | POA: Insufficient documentation

## 2018-03-11 DIAGNOSIS — M1711 Unilateral primary osteoarthritis, right knee: Secondary | ICD-10-CM

## 2018-03-11 MED ORDER — MORPHINE SULFATE 15 MG PO TABS: 15.0000 mg | ORAL_TABLET | Freq: Two times a day (BID) | ORAL | 0 refills | Status: DC | PRN

## 2018-03-11 NOTE — Therapy (Addendum)
[place], [place], [address] Phone: [phone]   Fax:  [phone]  Physical Therapy Treatment  Progress Note Reporting Period 01/13/2018  to 03/11/2018  See note below for Objective Data and Assessment of Progress/Goals.      Patient Details  Name: [name] MRN: [id number] Date of Birth: 11-02-1940 Referring Provider (PT): Zollie Beckers MD   Encounter Date: 03/11/2018  PT End of Session - 03/11/18 1519    Visit Number  10    Number of Visits  17    Date for PT Re-Evaluation  03/10/18    Authorization Type  medicare progress note at 10th visit and Kx mod by 15th visit    PT Start Time  1430    PT Stop Time  1510    PT Time Calculation (min)  40 min    Activity Tolerance  Patient tolerated treatment well    Behavior During Therapy  [facility] for tasks assessed/performed       Past Medical History:  Diagnosis Date  . Acute on chronic diastolic (congestive) heart failure ([place])   . Anemia    iron deficiency anemia - secondary to blood loss ( chronic)   . Arthritis    endstage changes bilateral knees/bilateral ankles.   . Asthma   . Carotid artery occlusion   . Chronic fatigue   . Chronic kidney disease   . Closed left hip fracture ([place])   . Clotting disorder ([place])    pt denies this  . Contusion of left knee    due to fall 1/14.  [name] COPD (chronic obstructive pulmonary disease) (HCC)    pulmonary fibrosis  . Depression, reactive   . Diabetes mellitus    type II   . Diastolic dysfunction   . Difficulty in walking   . Family history of heart disease   . Generalized muscle weakness   . Gout   . High cholesterol   . History of falling   . Hypertension   . Hypothyroidism   . Interstitial lung disease ([place])   . 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    . Morbid obesity ([place])   . Morbid obesity  ([place])   . Neuromuscular disorder ([place])    diabetic neuropathy   . Normal coronary arteries    cardiac catheterization performed  10/31/14  . RA (rheumatoid arthritis) ([place])    has been off methotreaxte since 10/13.  [name] Rheumatoid arthritis ([place])   . Shortness of breath   . Spinal stenosis of lumbar region   . Thyroid disease   . Unspecified lack of coordination   . URI (upper respiratory infection)     Past Surgical History:  Procedure Laterality Date  . ABDOMINAL HYSTERECTOMY    . BRAIN SURGERY     Gamma knife 10/13. Needs repeat spring  '14  . CARDIAC CATHETERIZATION N/A 10/31/2014   Procedure: Right/Left Heart Cath and Coronary Angiography;  Surgeon: [name], MD;  Location: [facility];  Service: Cardiovascular;  Laterality: N/A;  . ESOPHAGOGASTRODUODENOSCOPY (EGD) WITH PROPOFOL N/A 09/14/2014   Procedure: ESOPHAGOGASTRODUODENOSCOPY (EGD) WITH PROPOFOL;  Surgeon: [name], MD;  [facility];  Service: Endoscopy;  Laterality: N/A;  . INCISION AND DRAINAGE HIP Left 01/16/2017   Procedure: IRRIGATION AND DEBRIDEMENT LEFT HIP;  Surgeon: [name], MD;  [facility];  Service: Orthopedics;  Laterality:  Left;  . INTRAMEDULLARY (IM) NAIL INTERTROCHANTERIC Left 11/29/2016   Procedure: INTRAMEDULLARY (IM) NAIL INTERTROCHANTRIC;  Surgeon: [name], MD;  Location: [place];  Service: Orthopedics;  Laterality: Left;  . OVARY SURGERY    . SHOULDER SURGERY Left   . TONSILLECTOMY  age 20  . VIDEO BRONCHOSCOPY Bilateral 05/31/2013   Procedure: VIDEO BRONCHOSCOPY WITHOUT FLUORO;  Surgeon: Brand Males, MD;  Location: [place];  Service: Cardiopulmonary;  Laterality: Bilateral;  . video bronscoscopy  2000   lung    There were no vitals filed for this visit.  Subjective Assessment - 03/11/18 1438    Subjective  I am feeling a little better than i was last time.    Currently in Pain?  Yes    Pain Score  2    5/10 thin  morning   Pain Location  Leg    Pain Orientation  Right;Posterior   RT>  LT  knee   Pain Descriptors / Indicators  Throbbing;Aching;Sore;Sharp    Pain Relieving Factors   knee  right  area with ace, warm shower    Pain Score  2    Pain Orientation  Left    Pain Descriptors / Indicators  Aching;Sore    Pain Type  Chronic pain    Aggravating Factors   standing longer,  moving leg    Pain Relieving Factors  medication,  rest,  Doingself care, chores sitting down.                        [place] Adult PT Treatment/Exercise - 03/11/18 0001      Ambulation/Gait   Gait Comments  shortened walker 2 inches.  Practiced YOGA walking.  patietn was able to move legs easier with less effort.       Lumbar Exercises: Standing   Other Standing Lumbar Exercises  posterior pelvic tilt.      Lumbar Exercises: Supine   Bent Knee Raise  5 reps   also 10 X posterior pelvic tilt.      Knee/Hip Exercises: Stretches   Active Hamstring Stretch Limitations  PROM 3 X 30 seconds right      Knee/Hip Exercises: Aerobic   Nustep  L4 X 7 minutes      Knee/Hip Exercises: Seated   Marching  10 reps    Marching Limitations  cued to hold abdominals vs hyperextending head and shoulders       Manual Therapy   Manual Therapy  Soft tissue mobilization    Soft tissue mobilization  right gluteal,  distal hamstrings.  tissue softened.    taught patient how to do.             PT Education - 03/11/18 1519    Education Details  how to do soft tissue work,  Best boy) Educated  Patient    Methods  Explanation;Demonstration;Tactile cues;Verbal cues    Comprehension  Verbalized understanding;Returned demonstration       PT Short Term Goals - 03/10/18 1215      PT SHORT TERM GOAL #1   Title  pt to be I with initial HEP     Time  4    Period  Weeks    Status  Achieved      PT SHORT TERM GOAL #2   Title  pt to verbalize/ demo proper posture and gait mechanics to prevent and reduce  hip and low back pain     Baseline  has practiced but continues require cues    Period  Weeks    Status  On-going      PT SHORT TERM GOAL #3   Title  pt to be able to stand / ambulate >/= 10 with LRAD with </= 6/10 pain to promote therapeutic progression     Baseline  able to walk 10 min but reports pain increases to 6-7/10    Time  4    Period  Weeks    Status  On-going        PT Long Term Goals - 03/10/18 1216      PT LONG TERM GOAL #1   Title  pt to increase bil Le strength to >/= 4/5 to promote hip/ knee stability and maximzie safety with prolonged standing/ walking activity    Time  8    Period  Weeks    Status  On-going    Target Date  04/07/18      PT LONG TERM GOAL #2   Title  pt to be able to ambulate >/= 30 min with LRAD reporting </= 4/10 pain for functional endurance for community and in home ambulation    Time  8    Period  Weeks    Status  On-going    Target Date  04/07/18      PT LONG TERM GOAL #3   Title  pt increase trunk mobility by >/= 10 degrees in flexion and sidebending to with </= 3/10 pain to assist with functional mobility required for ADL s    Time  8    Period  Weeks    Status  On-going    Target Date  04/07/18      PT LONG TERM GOAL #4   Title  incresae FOTO score to </= 60% limited to demo improvement in function    Baseline  25% limited on FOTO     Period  Weeks    Status  On-going    Target Date  04/07/18      PT LONG TERM GOAL #5   Title  pt to be I with all HEP given as of last visit to maintain and progress current level of function     Baseline  progress with HEP as able.     Time  8    Period  Weeks    Status  On-going    Target Date  04/07/18            Plan - 03/11/18 1521    Clinical Impression Statement  Patient continues to improve  functional mobility in clinic.  Walker lowered 2 inches.  Standing low back pain reduced with cued to brace abdominals vs standing with shoulders and head back  increasing lumbar curve.   I could not tell if She had SI issues today due to body composition.     PT Next Visit Plan  potentilal SIJ involvement on L, hamstring stretching gently, core activation, gait trainining and endurance, hip strengtheing, LAD to promote hip flexor activation.  balance training: if she does well may add corner balance to HEP    PT Home Exercise Plan  lower trunk rotation, gentle hamstring stretching (sitting and supine), posterior pelvic tilt. supine marching. standing marching using RW sit to stand at sinnk, sitting pelvic tilt,   YOGA walking ( no handout)    Consulted and Agree with Plan of Care  Patient       Patient will benefit from skilled therapeutic intervention  in order to improve the following deficits and impairments:     Visit Diagnosis: Chronic bilateral low back pain without sciatica  Pain in left leg  Other abnormalities of gait and mobility     Problem List Patient Active Problem List   Diagnosis Date Noted  . Chronic bilateral low back pain without sciatica 02/17/2018  . Pain in left hip 02/18/2017  . Wound infection after surgery, initial encounter 01/16/2017  . Closed nondisplaced intertrochanteric fracture of left femur with delayed healing 01/14/2017  . Postoperative wound infection 01/14/2017  . Gout 11/29/2016  . Depression 11/29/2016  . Closed left hip fracture ([place]) 11/29/2016  . GERD (gastroesophageal reflux disease) 11/29/2016  . Closed intertrochanteric fracture of hip, left, initial encounter ([place]) 11/29/2016  . Physical deconditioning 07/31/2015  . Pulmonary fibrosis, postinflammatory ([place]) 07/31/2015  . Tendinitis of left rotator cuff 06/13/2015  . Acute on chronic diastolic heart failure ([place]) 04/21/2015  . Acute pulmonary edema (HCC)   . AKI (acute kidney injury) ([place])   . Peripheral edema   . Acute on chronic respiratory failure with hypoxia ([place]) 04/17/2015  . CAP (community acquired pneumonia) 04/17/2015  . Acute kidney injury superimposed  on chronic kidney disease ([place]) 04/17/2015  . Hypotension 04/17/2015  . Chronic respiratory failure with hypoxia ([place]) 02/09/2015  . Dyspnea and respiratory abnormality 01/03/2015  . Normal coronary arteries 11/02/2014  . Morbid obesity ([place]) 10/30/2014  . Dysphagia 08/21/2014  . Sore throat 07/18/2014  . Chronic fatigue 06/21/2014  . DOE (dyspnea on exertion) 05/24/2014  . High risk medication use 04/17/2014  . Aphasia 02/12/2014  . 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 Lumbar facet arthropathy 01/18/2014  . Acute bronchitis 07/24/2013  . Bilateral edema of lower extremity 05/30/2013  . Chronic cough 05/24/2013  . ILD (interstitial lung disease) ([place]) 04/10/2013  . Greater trochanteric bursitis of left hip 08/13/2012  . Spinal stenosis of lumbar region with radiculopathy 07/12/2012  . Inability to walk 07/12/2012  . Acute lumbar back pain 07/12/2012  . Asymptomatic bacteriuria 07/12/2012  . 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 ([place]) 02/17/2012  . Benign neoplasm of meninges ([place]) 02/12/2012  . Brain mass 01/28/2012  . Chest pain with moderate risk of acute coronary syndrome 01/28/2012  . Rheumatoid arthritis ([place]) 01/28/2012  . Primary osteoarthritis of right knee 01/28/2012  . Dyslipidemia   . Thyroid disease   . Essential hypertension     Catharina Pica PTA 03/11/2018, 3:44 PM  [facility] [address] [place], [place], [address] Phone: [phone]   Fax:  [phone]  Name: [name] MRN: [id number] Date of Birth: 03/12/1941      Starr Lake PT, DPT, LAT, ATC  03/11/18  5:37 PM

## 2018-03-11 NOTE — Therapy (Signed)
[place], [place], [address] Phone: [phone]   Fax:  [phone]  Physical Therapy Treatment  Patient Details  Name: [name] MRN: [id number] Date of Birth: [birth year] Referring Provider (PT): Zollie Beckers MD   Encounter Date: 03/11/2018  PT End of Session - 03/11/18 1519    Visit Number  10    Number of Visits  17    Date for PT Re-Evaluation  03/10/18    Authorization Type  medicare progress note at 10th visit and Kx mod by 15th visit    PT Start Time  1430    PT Stop Time  1510    PT Time Calculation (min)  40 min    Activity Tolerance  Patient tolerated treatment well    Behavior During Therapy  [facility] for tasks assessed/performed       Past Medical History:  Diagnosis Date  . Acute on chronic diastolic (congestive) heart failure ([place])   . Anemia    iron deficiency anemia - secondary to blood loss ( chronic)   . Arthritis    endstage changes bilateral knees/bilateral ankles.   . Asthma   . Carotid artery occlusion   . Chronic fatigue   . Chronic kidney disease   . Closed left hip fracture ([place])   . Clotting disorder ([place])    pt denies this  . Contusion of left knee    due to fall 1/14.  [name] COPD (chronic obstructive pulmonary disease) (HCC)    pulmonary fibrosis  . Depression, reactive   . Diabetes mellitus    type II   . Diastolic dysfunction   . Difficulty in walking   . Family history of heart disease   . Generalized muscle weakness   . Gout   . High cholesterol   . History of falling   . Hypertension   . Hypothyroidism   . Interstitial lung disease ([place])   . 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    . Morbid obesity ([place])   . Morbid obesity ([place])   . Neuromuscular disorder ([place])    diabetic neuropathy   . Normal coronary arteries    cardiac catheterization performed   10/31/14  . RA (rheumatoid arthritis) ([place])    has been off methotreaxte since 10/13.  [name] Rheumatoid arthritis ([place])   . Shortness of breath   . Spinal stenosis of lumbar region   . Thyroid disease   . Unspecified lack of coordination   . URI (upper respiratory infection)     Past Surgical History:  Procedure Laterality Date  . ABDOMINAL HYSTERECTOMY    . BRAIN SURGERY     Gamma knife 10/13. Needs repeat spring  '14  . CARDIAC CATHETERIZATION N/A 10/31/2014   Procedure: Right/Left Heart Cath and Coronary Angiography;  Surgeon: [name], MD;  Location: [facility];  Service: Cardiovascular;  Laterality: N/A;  . ESOPHAGOGASTRODUODENOSCOPY (EGD) WITH PROPOFOL N/A 09/14/2014   Procedure: ESOPHAGOGASTRODUODENOSCOPY (EGD) WITH PROPOFOL;  Surgeon: [name], MD;  [facility];  Service: Endoscopy;  Laterality: N/A;  . INCISION AND DRAINAGE HIP Left 01/16/2017   Procedure: IRRIGATION AND DEBRIDEMENT LEFT HIP;  Surgeon: [name], MD;  Location: [facility];  Service: Orthopedics;  Laterality: Left;  . INTRAMEDULLARY (IM) NAIL INTERTROCHANTERIC Left 11/29/2016   Procedure: INTRAMEDULLARY (IM) NAIL INTERTROCHANTRIC;  Surgeon: [name], MD;  Location:  [facility];  Service: Orthopedics;  Laterality: Left;  . OVARY SURGERY    . SHOULDER SURGERY Left   . TONSILLECTOMY  age 37  . VIDEO BRONCHOSCOPY Bilateral 05/31/2013   Procedure: VIDEO BRONCHOSCOPY WITHOUT FLUORO;  Surgeon: Brand Males, MD;  Location: [facility];  Service: Cardiopulmonary;  Laterality: Bilateral;  . video bronscoscopy  2000   lung    There were no vitals filed for this visit.  Subjective Assessment - 03/11/18 1438    Subjective  I am feeling a little better than i was last time.    Currently in Pain?  Yes    Pain Score  2    5/10 thin morning   Pain Location  Leg    Pain Orientation  Right;Posterior   RT>  LT  knee   Pain Descriptors / Indicators   Throbbing;Aching;Sore;Sharp    Pain Relieving Factors   knee  right  area with ace, warm shower    Pain Score  2    Pain Orientation  Left    Pain Descriptors / Indicators  Aching;Sore    Pain Type  Chronic pain    Aggravating Factors   standing longer,  moving leg    Pain Relieving Factors  medication,  rest,  Doingself care, chores sitting down.                        [facility] Adult PT Treatment/Exercise - 03/11/18 0001      Ambulation/Gait   Gait Comments  shortened walker 2 inches.  Practiced YOGA walking.  patietn was able to move legs easier with less effort.       Lumbar Exercises: Standing   Other Standing Lumbar Exercises  posterior pelvic tilt.      Lumbar Exercises: Supine   Bent Knee Raise  5 reps   also 10 X posterior pelvic tilt.      Knee/Hip Exercises: Stretches   Active Hamstring Stretch Limitations  PROM 3 X 30 seconds right      Knee/Hip Exercises: Aerobic   Nustep  L4 X 7 minutes      Knee/Hip Exercises: Seated   Marching  10 reps    Marching Limitations  cued to hold abdominals vs hyperextending head and shoulders       Manual Therapy   Manual Therapy  Soft tissue mobilization    Soft tissue mobilization  right gluteal,  distal hamstrings.  tissue softened.    taught patient how to do.             PT Education - 03/11/18 1519    Education Details  how to do soft tissue work,  Best boy) Educated  Patient    Methods  Explanation;Demonstration;Tactile cues;Verbal cues    Comprehension  Verbalized understanding;Returned demonstration       PT Short Term Goals - 03/10/18 1215      PT SHORT TERM GOAL #1   Title  pt to be I with initial HEP     Time  4    Period  Weeks    Status  Achieved      PT SHORT TERM GOAL #2   Title  pt to verbalize/ demo proper posture and gait mechanics to prevent and reduce hip and low back pain     Baseline  has practiced but continues require cues    Period  Weeks    Status  On-going  PT SHORT TERM GOAL #3   Title  pt to be able to stand / ambulate >/= 10 with LRAD with </= 6/10 pain to promote therapeutic progression     Baseline  able to walk 10 min but reports pain increases to 6-7/10    Time  4    Period  Weeks    Status  On-going        PT Long Term Goals - 03/10/18 1216      PT LONG TERM GOAL #1   Title  pt to increase bil Le strength to >/= 4/5 to promote hip/ knee stability and maximzie safety with prolonged standing/ walking activity    Time  8    Period  Weeks    Status  On-going    Target Date  04/07/18      PT LONG TERM GOAL #2   Title  pt to be able to ambulate >/= 30 min with LRAD reporting </= 4/10 pain for functional endurance for community and in home ambulation    Time  8    Period  Weeks    Status  On-going    Target Date  04/07/18      PT LONG TERM GOAL #3   Title  pt increase trunk mobility by >/= 10 degrees in flexion and sidebending to with </= 3/10 pain to assist with functional mobility required for ADL s    Time  8    Period  Weeks    Status  On-going    Target Date  04/07/18      PT LONG TERM GOAL #4   Title  incresae FOTO score to </= 60% limited to demo improvement in function    Baseline  25% limited on FOTO     Period  Weeks    Status  On-going    Target Date  04/07/18      PT LONG TERM GOAL #5   Title  pt to be I with all HEP given as of last visit to maintain and progress current level of function     Baseline  progress with HEP as able.     Time  8    Period  Weeks    Status  On-going    Target Date  04/07/18            Plan - 03/11/18 1521    Clinical Impression Statement  Patient continues to improve  functional mobility in clinic.  Walker lowered 2 inches.  Standing low back pain reduced with cued to brace abdominals vs standing with shoulders and head back  increasing lumbar curve.  I could not tell if She had SI issues today due to body composition.     PT Next Visit Plan  potentilal SIJ involvement  on L, hamstring stretching gently, core activation, gait trainining and endurance, hip strengtheing, LAD to promote hip flexor activation.  balance training: if she does well may add corner balance to HEP    PT Home Exercise Plan  lower trunk rotation, gentle hamstring stretching (sitting and supine), posterior pelvic tilt. supine marching. standing marching using RW sit to stand at sinnk, sitting pelvic tilt,   YOGA walking ( no handout)    Consulted and Agree with Plan of Care  Patient       Patient will benefit from skilled therapeutic intervention in order to improve the following deficits and impairments:     Visit Diagnosis: Chronic bilateral low back pain without sciatica  Pain in left leg  Other abnormalities of gait and mobility     Problem List Patient Active Problem List   Diagnosis Date Noted  . Chronic bilateral low back pain without sciatica 02/17/2018  . Pain in left hip 02/18/2017  . Wound infection after surgery, initial encounter 01/16/2017  . Closed nondisplaced intertrochanteric fracture of left femur with delayed healing 01/14/2017  . Postoperative wound infection 01/14/2017  . Gout 11/29/2016  . Depression 11/29/2016  . Closed left hip fracture ([place]) 11/29/2016  . GERD (gastroesophageal reflux disease) 11/29/2016  . Closed intertrochanteric fracture of hip, left, initial encounter ([place]) 11/29/2016  . Physical deconditioning 07/31/2015  . Pulmonary fibrosis, postinflammatory ([place]) 07/31/2015  . Tendinitis of left rotator cuff 06/13/2015  . Acute on chronic diastolic heart failure ([place]) 04/21/2015  . Acute pulmonary edema (HCC)   . AKI (acute kidney injury) ([place])   . Peripheral edema   . Acute on chronic respiratory failure with hypoxia ([place]) 04/17/2015  . CAP (community acquired pneumonia) 04/17/2015  . Acute kidney injury superimposed on chronic kidney disease ([place]) 04/17/2015  . Hypotension 04/17/2015  . Chronic respiratory failure with hypoxia ([place])  02/09/2015  . Dyspnea and respiratory abnormality 01/03/2015  . Normal coronary arteries 11/02/2014  . Morbid obesity ([place]) 10/30/2014  . Dysphagia 08/21/2014  . Sore throat 07/18/2014  . Chronic fatigue 06/21/2014  . DOE (dyspnea on exertion) 05/24/2014  . High risk medication use 04/17/2014  . Aphasia 02/12/2014  . 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 Lumbar facet arthropathy 01/18/2014  . Acute bronchitis 07/24/2013  . Bilateral edema of lower extremity 05/30/2013  . Chronic cough 05/24/2013  . ILD (interstitial lung disease) ([place]) 04/10/2013  . Greater trochanteric bursitis of left hip 08/13/2012  . Spinal stenosis of lumbar region with radiculopathy 07/12/2012  . Inability to walk 07/12/2012  . Acute lumbar back pain 07/12/2012  . Asymptomatic bacteriuria 07/12/2012  . 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 ([place]) 02/17/2012  . Benign neoplasm of meninges ([place]) 02/12/2012  . Brain mass 01/28/2012  . Chest pain with moderate risk of acute coronary syndrome 01/28/2012  . Rheumatoid arthritis ([place]) 01/28/2012  . Primary osteoarthritis of right knee 01/28/2012  . Dyslipidemia   . Thyroid disease   . Essential hypertension     Blaze Nylund PTA 03/11/2018, 3:27 PM  [facility] [address] [place], [place], [address] Phone: [phone]   Fax:  [phone]  Name: [name] MRN: [id number] Date of Birth: [birth year]

## 2018-03-11 NOTE — Progress Notes (Signed)
Subjective:    Patient ID: [name], female    DOB: 06/06/1940, 77 y.o.   MRN: [id number]  HPI: [name] is a 77 year old female who returns for follow up appointment for chronic pain and medication refill. She states her pain is located in her lower back radiating into her bilateral lower extremities, bilateral hip pain R>L. Also reports generalized pain all over. She rates her pain 4. Her current exercise regime is walking short distances with her walker in the home and attending physical therapy twice a week.   [name] reports she had a fall two weeks ago she was sitting in a rolling chair when she was using her reacher to reach for something and fell on her right side, her children helped her up. They have removed the rolling chair she reports. She followed up with her PCP.   [name] called office yesterday for an appointment due to running out of her medications, she didn't realize Dr. Naaman Plummer sent the second prescription to the pharmacy, [facility] was called and they had her prescription, this provider asked for them to fill her prescription. This was discussed and she verbalizes understanding. She admits she has been distracted with the passing of her daughter and handling her affairs. Emotional support given.   [name] usual Morphine Equivalent would be 60.00. MME. Last UDS was Performed on 12/09/2017, it was consistent.   Pain Inventory Average Pain 4 Pain Right Now 4 My pain is dull  In the last 24 hours, has pain interfered with the following? General activity 7 Relation with others 7 Enjoyment of life 8 What TIME of day is your pain at its worst? morning Sleep (in general) Fair  Pain is worse with: walking, bending, standing and some activites Pain improves with: rest, therapy/exercise, pacing activities and medication Relief from Meds: 7  Mobility use a walker ability to climb steps?  yes use a wheelchair transfers  alone  Function retired I need assistance with the following:  meal prep, household duties and shopping  Neuro/Psych bladder control problems weakness numbness tingling trouble walking  Prior Studies Any changes since last visit?  no  Physicians involved in your care Any changes since last visit?  no   Family History  Problem Relation Age of Onset  . Diabetes Mother   . Heart attack Mother   . Hypertension Father   . Lung cancer Father   . Diabetes Sister   . Diabetes Brother   . Hypertension Brother   . Heart disease Brother   . Heart attack Brother   . Kidney cancer Brother   . Uterine cancer Daughter   . Breast cancer Sister   . Rheum arthritis Maternal Uncle   . Gout Brother   . Kidney failure Brother        x 5   Social History   Socioeconomic History  . Marital status: Married    Spouse name: Not on file  . Number of children: 6  . Years of education: college  . Highest education level: Not on file  Occupational History  . Occupation: retired Ship broker  . Financial resource strain: Not on file  . Food insecurity:    Worry: Not on file    Inability: Not on file  . Transportation needs:    Medical: Not on file    Non-medical: Not on file  Tobacco Use  . Smoking status: Never Smoker  . Smokeless tobacco: Never  Used  Substance and Sexual Activity  . Alcohol use: No  . Drug use: No  . Sexual activity: Not Currently  Lifestyle  . Physical activity:    Days per week: Not on file    Minutes per session: Not on file  . Stress: Not on file  Relationships  . Social connections:    Talks on phone: Not on file    Gets together: Not on file    Attends religious service: Not on file    Active member of club or organization: Not on file    Attends meetings of clubs or organizations: Not on file    Relationship status: Not on file  Other Topics Concern  . Not on file  Social History Narrative   Patient consumes 2-3 cups coffee per day.    Past Surgical History:  Procedure Laterality Date  . ABDOMINAL HYSTERECTOMY    . BRAIN SURGERY     Gamma knife 10/13. Needs repeat spring  '14  . CARDIAC CATHETERIZATION N/A 10/31/2014   Procedure: Right/Left Heart Cath and Coronary Angiography;  Surgeon: [name], MD;  Location: [facility];  Service: Cardiovascular;  Laterality: N/A;  . ESOPHAGOGASTRODUODENOSCOPY (EGD) WITH PROPOFOL N/A 09/14/2014   Procedure: ESOPHAGOGASTRODUODENOSCOPY (EGD) WITH PROPOFOL;  Surgeon: [name], MD;  [facility];  Service: Endoscopy;  Laterality: N/A;  . INCISION AND DRAINAGE HIP Left 01/16/2017   Procedure: IRRIGATION AND DEBRIDEMENT LEFT HIP;  Surgeon: [name], MD;  [facility];  Service: Orthopedics;  Laterality: Left;  . INTRAMEDULLARY (IM) NAIL INTERTROCHANTERIC Left 11/29/2016   Procedure: INTRAMEDULLARY (IM) NAIL INTERTROCHANTRIC;  Surgeon: [name], MD;  Location: [facility];  Service: Orthopedics;  Laterality: Left;  . OVARY SURGERY    . SHOULDER SURGERY Left   . TONSILLECTOMY  age 15  . VIDEO BRONCHOSCOPY Bilateral 05/31/2013   Procedure: VIDEO BRONCHOSCOPY WITHOUT FLUORO;  Surgeon: Brand Males, MD;  Location: [facility];  Service: Cardiopulmonary;  Laterality: Bilateral;  . video bronscoscopy  2000   lung   Past Medical History:  Diagnosis Date  . Acute on chronic diastolic (congestive) heart failure ([place])   . Anemia    iron deficiency anemia - secondary to blood loss ( chronic)   . Arthritis    endstage changes bilateral knees/bilateral ankles.   . Asthma   . Carotid artery occlusion   . Chronic fatigue   . Chronic kidney disease   . Closed left hip fracture ([place])   . Clotting disorder ([place])    pt denies this  . Contusion of left knee    due to fall 1/14.  [name] COPD (chronic obstructive pulmonary disease) (HCC)    pulmonary fibrosis  . Depression, reactive   . Diabetes mellitus    type II   . Diastolic  dysfunction   . Difficulty in walking   . Family history of heart disease   . Generalized muscle weakness   . Gout   . High cholesterol   . History of falling   . Hypertension   . Hypothyroidism   . Interstitial lung disease ([place])   . 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    . Morbid obesity ([place])   . Morbid obesity ([place])   . Neuromuscular disorder ([place])    diabetic neuropathy   . Normal coronary arteries    cardiac catheterization performed  10/31/14  . RA (  rheumatoid arthritis) ([place])    has been off methotreaxte since 10/13.  [name] Rheumatoid arthritis ([place])   . Shortness of breath   . Spinal stenosis of lumbar region   . Thyroid disease   . Unspecified lack of coordination   . URI (upper respiratory infection)    There were no vitals taken for this visit.  Opioid Risk Score:   Fall Risk Score:  `1  Depression screen PHQ 2/9  Depression screen [facility] 2/9 12/09/2017 10/22/2017 09/22/2017 07/28/2017 06/30/2017 06/03/2017 05/07/2017  Decreased Interest 1 0 0 0 0 0 0  Down, Depressed, Hopeless 1 0 0 0 0 0 0  PHQ - 2 Score 2 0 0 0 0 0 0  Altered sleeping - - - - - - -  Tired, decreased energy - - - - - - -  Change in appetite - - - - - - -  Feeling bad or failure about yourself  - - - - - - -  Trouble concentrating - - - - - - -  Moving slowly or fidgety/restless - - - - - - -  Suicidal thoughts - - - - - - -  PHQ-9 Score - - - - - - -  Difficult doing work/chores - - - - - - -  Some recent data might be hidden     Review of Systems  Constitutional: Negative.   HENT: Negative.   Eyes: Negative.   Respiratory: Negative.   Cardiovascular: Negative.   Gastrointestinal: Negative.   Endocrine: Negative.   Genitourinary: Positive for difficulty urinating.  Musculoskeletal: Positive for arthralgias, back pain, gait problem, joint swelling and myalgias.  Skin: Negative.    Allergic/Immunologic: Negative.   Neurological: Positive for weakness and numbness.  Hematological: Negative.   Psychiatric/Behavioral: Negative.   All other systems reviewed and are negative.      Objective:   Physical Exam  Constitutional: She is oriented to person, place, and time. She appears well-developed and well-nourished.  HENT:  Head: Normocephalic and atraumatic.  Neck: Normal range of motion. Neck supple.  Cardiovascular: Normal rate and regular rhythm.  Pulmonary/Chest: Effort normal and breath sounds normal.  Continuous Oxygen @ 2 Liters Nasal Cannula  Musculoskeletal:  Normal Muscle Bulk and Muscle Testing Reveals: Upper Extremities: Full ROM and Muscle Strength 5/5 Thoracic and Lumbar Hypersensitivity Lower Extremities: Right: Decreased ROM and Muscle Strength 4/5 Right Lower Extremity Flexion: Produces Pain into Extremity and Knee Left: Full ROM and Muscle Strength 5/5 Anasarca  Arrived in wheelchair  Neurological: She is alert and oriented to person, place, and time.  Skin: Skin is warm and dry.  Psychiatric: She has a normal mood and affect. Her behavior is normal.  Nursing note and vitals reviewed.         Assessment & Plan:  1. Lumbar spinal stenosis with neurogenic claudication. Associated facet arthropathy/ Lumbar Radiculitis: Continuecurrent medication regime withGabapentin, Physical Therapyand HEP as tolerated. 03/11/2018 Continuecurrent treatment: MS Contin 15 mg one tablet every 12 hours #60and continueMSIR 15 one tablet twice a dayas needed ( 6 am and 1:00 pm)#60. No script given: She had prescription at the Pharmacy.  We will continue the opioid monitoring program, this consists of regular clinic visits, examinations, urine drug screen, pill counts as well as use of [place] Controlled Substance reporting System. 2. Depression: Continuecurrent medication Regime:Cymbalta. 12/10/2017 3. Diabetes mellitus type 2 with  polyneuropathy: Continuecurrent medication regime:Gabapentin. 03/11/2018 4. Rheumatoid arthritis and osteoarthritis. Continuecurrent medication Regime.Voltaren Gel. Rheumatology Following. 03/11/2018 5.  Interstitial lung disease: Pulmonology Following.03/11/2018. 6. Bilateral Osteoarthritis Knee's:  Schedule for Right Knee Zilretta Injection with Dr. Naaman Plummer on 03/17/18. Continuecurrent medication regimen.Voltaren Gel. 03/11/2018 7. Muscle Spasm: Continuecurrent medication regime: Flexeril. 03/11/2018 8.BilateralGreater Trochanteric Tenderness: R>L. Continuecurrent treatmentwith Ice/Heat Therapy. 03/11/2018. 9. Polyarthralgia: Rheumatology Following: Continue to monitor.03/11/2018 10. Morbid Obesitity: Continue Healthy Diet Regime and HEP. Continue to monitor.03/11/2018 11. Fall: Educated on Falls Prevention: She verbalizes understanding.   40 minutes of face to face patient care time was spent during this visit. All questions were encouraged and answered.   F/U in 1 month

## 2018-03-15 ENCOUNTER — Encounter: Payer: Medicare Other | Admitting: Registered Nurse

## 2018-03-16 DIAGNOSIS — H25013 Cortical age-related cataract, bilateral: Secondary | ICD-10-CM | POA: Diagnosis not present

## 2018-03-16 DIAGNOSIS — E119 Type 2 diabetes mellitus without complications: Secondary | ICD-10-CM | POA: Diagnosis not present

## 2018-03-16 DIAGNOSIS — H2513 Age-related nuclear cataract, bilateral: Secondary | ICD-10-CM | POA: Diagnosis not present

## 2018-03-16 DIAGNOSIS — M0689 Other specified rheumatoid arthritis, multiple sites: Secondary | ICD-10-CM | POA: Diagnosis not present

## 2018-03-16 DIAGNOSIS — H04123 Dry eye syndrome of bilateral lacrimal glands: Secondary | ICD-10-CM | POA: Diagnosis not present

## 2018-03-16 DIAGNOSIS — H524 Presbyopia: Secondary | ICD-10-CM | POA: Diagnosis not present

## 2018-03-16 DIAGNOSIS — H43813 Vitreous degeneration, bilateral: Secondary | ICD-10-CM | POA: Diagnosis not present

## 2018-03-16 DIAGNOSIS — Z8669 Personal history of other diseases of the nervous system and sense organs: Secondary | ICD-10-CM | POA: Diagnosis not present

## 2018-03-16 DIAGNOSIS — Z83511 Family history of glaucoma: Secondary | ICD-10-CM | POA: Diagnosis not present

## 2018-03-16 DIAGNOSIS — Z7984 Long term (current) use of oral hypoglycemic drugs: Secondary | ICD-10-CM | POA: Diagnosis not present

## 2018-03-16 DIAGNOSIS — H52203 Unspecified astigmatism, bilateral: Secondary | ICD-10-CM | POA: Diagnosis not present

## 2018-03-16 DIAGNOSIS — Z79899 Other long term (current) drug therapy: Secondary | ICD-10-CM | POA: Diagnosis not present

## 2018-03-17 ENCOUNTER — Other Ambulatory Visit: Payer: Medicare Other | Admitting: Licensed Clinical Social Worker

## 2018-03-17 ENCOUNTER — Other Ambulatory Visit: Payer: Medicare Other | Admitting: *Deleted

## 2018-03-17 ENCOUNTER — Encounter: Payer: Self-pay | Admitting: Physical Therapy

## 2018-03-17 ENCOUNTER — Ambulatory Visit: Payer: Medicare Other | Admitting: Physical Therapy

## 2018-03-17 ENCOUNTER — Encounter: Payer: Medicare Other | Admitting: Physical Medicine & Rehabilitation

## 2018-03-17 DIAGNOSIS — R2689 Other abnormalities of gait and mobility: Secondary | ICD-10-CM | POA: Diagnosis not present

## 2018-03-17 DIAGNOSIS — M79605 Pain in left leg: Secondary | ICD-10-CM | POA: Diagnosis not present

## 2018-03-17 DIAGNOSIS — Z515 Encounter for palliative care: Secondary | ICD-10-CM

## 2018-03-17 DIAGNOSIS — M545 Low back pain, unspecified: Secondary | ICD-10-CM

## 2018-03-17 DIAGNOSIS — G8929 Other chronic pain: Secondary | ICD-10-CM

## 2018-03-17 NOTE — Therapy (Signed)
[place], [place], [address] Phone: [phone]   Fax:  [phone]  Physical Therapy Treatment  Patient Details  Name: [name] MRN: [id number] Date of Birth: 01-19-1941 Referring Provider (PT): Zollie Beckers MD   Encounter Date: 03/17/2018  PT End of Session - 03/17/18 1233    Visit Number  11    Number of Visits  17    Date for PT Re-Evaluation  04/07/18    Authorization Type  medicare progress note at 10th visit and Kx mod by 15th visit    PT Start Time  1146    PT Stop Time  1230    PT Time Calculation (min)  44 min    Activity Tolerance  Patient tolerated treatment well    Behavior During Therapy  [facility] for tasks assessed/performed       Past Medical History:  Diagnosis Date  . Acute on chronic diastolic (congestive) heart failure ([place])   . Anemia    iron deficiency anemia - secondary to blood loss ( chronic)   . Arthritis    endstage changes bilateral knees/bilateral ankles.   . Asthma   . Carotid artery occlusion   . Chronic fatigue   . Chronic kidney disease   . Closed left hip fracture ([place])   . Clotting disorder ([place])    pt denies this  . Contusion of left knee    due to fall 1/14.  [name] COPD (chronic obstructive pulmonary disease) (H[facility])    pulmonary fibrosis  . Depression, reactive   . Diabetes mellitus    type II   . Diastolic dysfunction   . Difficulty in walking   . Family history of heart disease   . Generalized muscle weakness   . Gout   . High cholesterol   . History of falling   . Hypertension   . Hypothyroidism   . Interstitial lung disease ([place])   . 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    . Morbid obesity ([place])   . Morbid obesity ([place])   . Neuromuscular disorder ([place])    diabetic neuropathy   . Normal coronary arteries    cardiac catheterization performed   10/31/14  . RA (rheumatoid arthritis) ([place])    has been off methotreaxte since 10/13.  [name] Rheumatoid arthritis ([place])   . Shortness of breath   . Spinal stenosis of lumbar region   . Thyroid disease   . Unspecified lack of coordination   . URI (upper respiratory infection)     Past Surgical History:  Procedure Laterality Date  . ABDOMINAL HYSTERECTOMY    . BRAIN SURGERY     Gamma knife 10/13. Needs repeat spring  '14  . CARDIAC CATHETERIZATION N/A 10/31/2014   Procedure: Right/Left Heart Cath and Coronary Angiography;  Surgeon: [name], MD;  Location: [facility];  Service: Cardiovascular;  Laterality: N/A;  . ESOPHAGOGASTRODUODENOSCOPY (EGD) WITH PROPOFOL N/A 09/14/2014   Procedure: ESOPHAGOGASTRODUODENOSCOPY (EGD) WITH PROPOFOL;  Surgeon: [name], MD;  Location: [facility];  Service: Endoscopy;  Laterality: N/A;  . INCISION AND DRAINAGE HIP Left 01/16/2017   Procedure: IRRIGATION AND DEBRIDEMENT LEFT HIP;  Surgeon: [name], MD;  Location: [facility];  Service: Orthopedics;  Laterality: Left;  . INTRAMEDULLARY (IM) NAIL INTERTROCHANTERIC Left 11/29/2016   Procedure: INTRAMEDULLARY (IM) NAIL INTERTROCHANTRIC;  Surgeon: [name], MD;  Location:  [facility];  Service: Orthopedics;  Laterality: Left;  . OVARY SURGERY    . SHOULDER SURGERY Left   . TONSILLECTOMY  age 55  . VIDEO BRONCHOSCOPY Bilateral 05/31/2013   Procedure: VIDEO BRONCHOSCOPY WITHOUT FLUORO;  Surgeon: Brand Males, MD;  Location: [facility];  Service: Cardiopulmonary;  Laterality: Bilateral;  . video bronscoscopy  2000   lung    Vitals:   03/17/18 1153  SpO2: 98%    Subjective Assessment - 03/17/18 1153    Subjective  " I have noticied more soreness in the  R leg today at 5/10. I did alot of walking the other day"     Currently in Pain?  Yes    Pain Score  5     Pain Location  Leg    Pain Orientation  Right;Anterior;Posterior    Pain Descriptors / Indicators   Aching;Sore    Pain Type  Chronic pain    Pain Onset  More than a month ago    Pain Frequency  Intermittent    Aggravating Factors   standing/ walking for long periods of time.                        [facility] Adult PT Treatment/Exercise - 03/17/18 0001      Exercises   Exercises  Knee/Hip;Lumbar      Lumbar Exercises: Supine   Pelvic Tilt  5 reps;5 seconds   x 2 sets   Bent Knee Raise  10 reps   cues to keep core tight    Bent Knee Raise Limitations  cues to work on breathing throughout exercise via counting outloud      Knee/Hip Exercises: Stretches   Passive Hamstring Stretch  2 reps;30 seconds      Knee/Hip Exercises: Aerobic   Nustep  L5 x 7 min UE/LE      Knee/Hip Exercises: Supine   Heel Slides  Right;Strengthening;2 sets;10 reps   towel under shoe to promote slide   Straight Leg Raises  Both;10 reps;Strengthening;2 sets    Other Supine Knee/Hip Exercises  clamshells 2 x 15 with green theraband      Manual Therapy   Manual therapy comments  MTPR along distal semi-tendinosus working distal to proximal               PT Short Term Goals - 03/10/18 1215      PT SHORT TERM GOAL #1   Title  pt to be I with initial HEP     Time  4    Period  Weeks    Status  Achieved      PT SHORT TERM GOAL #2   Title  pt to verbalize/ demo proper posture and gait mechanics to prevent and reduce hip and low back pain     Baseline  has practiced but continues require cues    Period  Weeks    Status  On-going      PT SHORT TERM GOAL #3   Title  pt to be able to stand / ambulate >/= 10 with LRAD with </= 6/10 pain to promote therapeutic progression     Baseline  able to walk 10 min but reports pain increases to 6-7/10    Time  4    Period  Weeks    Status  On-going        PT Long Term Goals - 03/10/18 1216      PT LONG TERM GOAL #1  Title  pt to increase bil Le strength to >/= 4/5 to promote hip/ knee stability and maximzie safety with prolonged  standing/ walking activity    Time  8    Period  Weeks    Status  On-going    Target Date  04/07/18      PT LONG TERM GOAL #2   Title  pt to be able to ambulate >/= 30 min with LRAD reporting </= 4/10 pain for functional endurance for community and in home ambulation    Time  8    Period  Weeks    Status  On-going    Target Date  04/07/18      PT LONG TERM GOAL #3   Title  pt increase trunk mobility by >/= 10 degrees in flexion and sidebending to with </= 3/10 pain to assist with functional mobility required for ADL s    Time  8    Period  Weeks    Status  On-going    Target Date  04/07/18      PT LONG TERM GOAL #4   Title  incresae FOTO score to </= 60% limited to demo improvement in function    Baseline  25% limited on FOTO     Period  Weeks    Status  On-going    Target Date  04/07/18      PT LONG TERM GOAL #5   Title  pt to be I with all HEP given as of last visit to maintain and progress current level of function     Baseline  progress with HEP as able.     Time  8    Period  Weeks    Status  On-going    Target Date  04/07/18            Plan - 03/17/18 1235    Clinical Impression Statement  mrs Knipfer demonstrates increased fatigue today, monitored O2 which she remained aroudn 97-98% throughout the session. continued R knee soreness which calmed with hamstring STW and stretching. focused on hip/ knee strengthening and core in supine. Decrease pain noted with standing / walking and reported walking was easier.     PT Treatment/Interventions  ADLs/Self Care Home Management;Cryotherapy;Electrical Stimulation;Iontophoresis 57m/ml Dexamethasone;Moist Heat;Ultrasound;Balance training;Stair training;Functional mobility training;Therapeutic activities;Therapeutic exercise;Manual techniques;Taping;Dry needling;Gait training    PT Next Visit Plan   hamstring stretching gently, core activation, gait trainining and endurance, hip strengtheing, LAD to promote hip flexor  activation.  balance training: if she does well may add corner balance to HEP    PT Home Exercise Plan  lower trunk rotation, gentle hamstring stretching (sitting and supine), posterior pelvic tilt. supine marching. standing marching using RW sit to stand at sinnk, sitting pelvic tilt,   YOGA walking ( no handout)    Consulted and Agree with Plan of Care  Patient       Patient will benefit from skilled therapeutic intervention in order to improve the following deficits and impairments:  Abnormal gait, Pain, Decreased activity tolerance, Decreased balance, Postural dysfunction, Improper body mechanics, Decreased strength  Visit Diagnosis: Chronic bilateral low back pain without sciatica  Pain in left leg  Other abnormalities of gait and mobility     Problem List Patient Active Problem List   Diagnosis Date Noted  . Chronic bilateral low back pain without sciatica 02/17/2018  . Pain in left hip 02/18/2017  . Wound infection after surgery, initial encounter 01/16/2017  . Closed nondisplaced intertrochanteric fracture of left  femur with delayed healing 01/14/2017  . Postoperative wound infection 01/14/2017  . Gout 11/29/2016  . Depression 11/29/2016  . Closed left hip fracture ([place]) 11/29/2016  . GERD (gastroesophageal reflux disease) 11/29/2016  . Closed intertrochanteric fracture of hip, left, initial encounter ([place]) 11/29/2016  . Physical deconditioning 07/31/2015  . Pulmonary fibrosis, postinflammatory ([place]) 07/31/2015  . Tendinitis of left rotator cuff 06/13/2015  . Acute on chronic diastolic heart failure ([place]) 04/21/2015  . Acute pulmonary edema (H[facility])   . AKI (acute kidney injury) ([place])   . Peripheral edema   . Acute on chronic respiratory failure with hypoxia ([place]) 04/17/2015  . CAP (community acquired pneumonia) 04/17/2015  . Acute kidney injury superimposed on chronic kidney disease ([place]) 04/17/2015  . Hypotension 04/17/2015  . Chronic respiratory failure with hypoxia  ([place]) 02/09/2015  . Dyspnea and respiratory abnormality 01/03/2015  . Normal coronary arteries 11/02/2014  . Morbid obesity ([place]) 10/30/2014  . Dysphagia 08/21/2014  . Sore throat 07/18/2014  . Chronic fatigue 06/21/2014  . DOE (dyspnea on exertion) 05/24/2014  . High risk medication use 04/17/2014  . Aphasia 02/12/2014  . 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 Lumbar facet arthropathy 01/18/2014  . Acute bronchitis 07/24/2013  . Bilateral edema of lower extremity 05/30/2013  . Chronic cough 05/24/2013  . ILD (interstitial lung disease) ([place]) 04/10/2013  . Greater trochanteric bursitis of left hip 08/13/2012  . Spinal stenosis of lumbar region with radiculopathy 07/12/2012  . Inability to walk 07/12/2012  . Acute lumbar back pain 07/12/2012  . Asymptomatic bacteriuria 07/12/2012  . 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 ([place]) 02/17/2012  . Benign neoplasm of meninges ([place]) 02/12/2012  . Brain mass 01/28/2012  . Chest pain with moderate risk of acute coronary syndrome 01/28/2012  . Rheumatoid arthritis ([place]) 01/28/2012  . Primary osteoarthritis of right knee 01/28/2012  . Dyslipidemia   . Thyroid disease   . Essential hypertension    Starr Lake PT, DPT, LAT, ATC  03/17/18  12:41 PM      [facility] [address] [place], [place], [address] Phone: [phone]   Fax:  [phone]  Name: [name] MRN: [id number] Date of Birth: Feb 24, 1941

## 2018-03-17 NOTE — Procedures (Signed)
Lumbar Facet Joint Intra-Articular Injection(s) with Fluoroscopic Guidance  Patient: [name]      Date of Birth: 08-31-1940 MRN: [id number] PCP: Glendale Chard, MD      Visit Date: 03/08/2018   Universal Protocol:    Date/Time: 03/08/2018  Consent Given By: the patient  Position: PRONE   Additional Comments: Vital signs were monitored before and after the procedure. Patient was prepped and draped in the usual sterile fashion. The correct patient, procedure, and site was verified.   Injection Procedure Details:  Procedure Site One Meds Administered:  Meds ordered this encounter  Medications  . methylPREDNISolone acetate (DEPO-MEDROL) injection 80 mg     Laterality: Bilateral  Location/Site:  L3-L4 L4-L5  Needle size: 22 guage  Needle type: Spinal  Needle Placement: Articular  Findings:  -Comments: Excellent flow of contrast producing a partial arthrogram.  Procedure Details: The fluoroscope beam is vertically oriented in AP, and the inferior recess is visualized beneath the lower pole of the inferior apophyseal process, which represents the target point for needle insertion. When direct visualization is difficult the target point is located at the medial projection of the vertebral pedicle. The region overlying each aforementioned target is locally anesthetized with a 1 to 2 ml. volume of 1% Lidocaine without Epinephrine.   The spinal needle was inserted into each of the above mentioned facet joints using biplanar fluoroscopic guidance. A 0.25 to 0.5 ml. volume of Isovue-250 was injected and a partial facet joint arthrogram was obtained. A single spot film was obtained of the resulting arthrogram.    One to 1.25 ml of the steroid/anesthetic solution was then injected into each of the facet joints noted above.   Additional Comments:  The patient tolerated the procedure well Dressing: Band-Aid    Post-procedure details: Patient was observed during the  procedure. Post-procedure instructions were reviewed.  Patient left the clinic in stable condition.

## 2018-03-17 NOTE — Progress Notes (Signed)
[name] - 77 y.o. female MRN [id number]  Date of birth: Nov 03, 1940  Office Visit Note: Visit Date: 03/08/2018 PCP: Glendale Chard, MD Referred by: Glendale Chard, MD  Subjective: Chief Complaint  Patient presents with  . Lower Back - Pain   HPI: [name] is a 77 y.o. female who comes in today Who comes in today for planned bilateral facet joint blocks diagnostically and therapeutically at the request of Dr. Jean Rosenthal.  He has been following the patient orthopedically since he saw her for fractured femur at some point.  She has had MRI from 2014 showing mainly facet arthropathy and multifactorial stenosis at L4-5 but also moderate facet arthropathy at both L3-4 and L5-S1.[name]  She is morbidly obese with BMI 45.7.  She also has significant respiratory issues and is on oxygen.  She is followed by Dr. Geannie Risen at [facility] physical medicine and rehabilitation.  She is on chronic opioid treatment.  We are happy to do the injection today diagnostically to see if it will help her she could obviously have these done through the [facility] department as well with Dr. Letta Pate.  Given her current symptoms it is worth diagnostically injecting the L3-4 L4-5 facet joints.  Essentially could be a candidate for radiofrequency ablation although she be a difficult case with her size.  Alternatively she does have pretty significant stenosis and this could be related to that at L4-5 and epidural injection could be considered.  ROS Otherwise per HPI.  Assessment & Plan: Visit Diagnoses:  1. Spondylosis without myelopathy or radiculopathy, lumbar region   2. Chronic bilateral low back pain without sciatica   3. Chronic respiratory failure with hypoxia (HCC)   4. Chronic pain syndrome     Plan: No additional findings.   Meds & Orders:  Meds ordered this encounter  Medications  . methylPREDNISolone acetate (DEPO-MEDROL) injection 80 mg    Orders Placed This Encounter    Procedures  . Facet Injection  . XR C-ARM NO REPORT    Follow-up: Return if symptoms worsen or fail to improve.   Procedures: No procedures performed  Lumbar Facet Joint Intra-Articular Injection(s) with Fluoroscopic Guidance  Patient: [name]      Date of Birth: January 31, 1941 MRN: [id number] PCP: Glendale Chard, MD      Visit Date: 03/08/2018   Universal Protocol:    Date/Time: 03/08/2018  Consent Given By: the patient  Position: PRONE   Additional Comments: Vital signs were monitored before and after the procedure. Patient was prepped and draped in the usual sterile fashion. The correct patient, procedure, and site was verified.   Injection Procedure Details:  Procedure Site One Meds Administered:  Meds ordered this encounter  Medications  . methylPREDNISolone acetate (DEPO-MEDROL) injection 80 mg     Laterality: Bilateral  Location/Site:  L3-L4 L4-L5  Needle size: 22 guage  Needle type: Spinal  Needle Placement: Articular  Findings:  -Comments: Excellent flow of contrast producing a partial arthrogram.  Procedure Details: The fluoroscope beam is vertically oriented in AP, and the inferior recess is visualized beneath the lower pole of the inferior apophyseal process, which represents the target point for needle insertion. When direct visualization is difficult the target point is located at the medial projection of the vertebral pedicle. The region overlying each aforementioned target is locally anesthetized with a 1 to 2 ml. volume of 1% Lidocaine without Epinephrine.   The spinal needle was inserted into each of the above mentioned  facet joints using biplanar fluoroscopic guidance. A 0.25 to 0.5 ml. volume of Isovue-250 was injected and a partial facet joint arthrogram was obtained. A single spot film was obtained of the resulting arthrogram.    One to 1.25 ml of the steroid/anesthetic solution was then injected into each of the facet joints noted  above.   Additional Comments:  The patient tolerated the procedure well Dressing: Band-Aid    Post-procedure details: Patient was observed during the procedure. Post-procedure instructions were reviewed.  Patient left the clinic in stable condition.    Clinical History: MRI LUMBAR SPINE WITHOUT CONTRAST 07/12/2012   Comparison: Plain film examination 07/12/2012.  No comparison MR.  Findings: Last fully open disc space is labeled L5-S1.  Present examination incorporates from T10-11 disc space through the lower sacrum.  Conus just below the T12-L1 disc space.  Prominent edema within the subcutaneous fat.  Endplate reactive changes most notable superior endplate L5.  Small hemangioma suspected inferior endplate of L1.  No worrisome bony abnormality.  Right renal 5 cm cyst incompletely assessed on the present exam.  T10-11:  Negative.  T11-12:  Left-sided facet joint degenerative changes and mild left- sided foraminal narrowing.  T12-L1:  Mild bulge.  L1-2:  Facet joint degenerative changes.  Mild bulge. Dorsal epidural fat. Slightly short pedicles.  Multifactorial mild spinal stenosis.  L2-3: Facet joint degenerative changes. Mild bulge.  Dorsal epidural fat.  Slightly short pedicles.  Multifactorial mild to slightly moderate spinal stenosis.  L3-4:  Facet joint degenerative changes.  Ligament flavum hypertrophy.  Short pedicles.  Minimal bulge.  Multifactorial moderate spinal stenosis.  L4-5:  Prominent bilateral facet joint degenerative changes.  2.8 mm anterior slip of L4.  Ligamentum flavum hypertrophy.  Bulge. Short pedicles.  Multifactorial moderate to marked spinal stenosis. Mild bilateral foraminal narrowing.  L5-S1: Moderate facet joint degenerative changes greater on the right.  Minimal bulge and spur greater left foraminal position without nerve root compression.  IMPRESSION:  Summary of pertinent findings include:  L4-5  multifactorial moderate to marked spinal stenosis.  Mild bilateral foraminal narrowing.  L3-4 multifactorial moderate spinal stenosis.  L2-3 multifactorial mild to slightly moderate spinal stenosis.  L1-2 multifactorial mild spinal stenosis.  L5-S1 moderate facet joint degenerative changes   She reports that she has never smoked. She has never used smokeless tobacco.  Recent Labs    02/22/18 1256  HGBA1C 6.4*    Objective:  VS:  HT:    WT:   BMI:     BP:120/74  HR:(!) 101bpm  TEMP:97.7 F (36.5 C)( )  RESP:98 % Physical Exam  Ortho Exam Imaging: No results found.  Past Medical/Family/Surgical/Social History: Medications & Allergies reviewed per EMR, new medications updated. Patient Active Problem List   Diagnosis Date Noted  . Chronic bilateral low back pain without sciatica 02/17/2018  . Pain in left hip 02/18/2017  . Wound infection after surgery, initial encounter 01/16/2017  . Closed nondisplaced intertrochanteric fracture of left femur with delayed healing 01/14/2017  . Postoperative wound infection 01/14/2017  . Gout 11/29/2016  . Depression 11/29/2016  . Closed left hip fracture ([place]) 11/29/2016  . GERD (gastroesophageal reflux disease) 11/29/2016  . Closed intertrochanteric fracture of hip, left, initial encounter ([place]) 11/29/2016  . Physical deconditioning 07/31/2015  . Pulmonary fibrosis, postinflammatory ([place]) 07/31/2015  . Tendinitis of left rotator cuff 06/13/2015  . Acute on chronic diastolic heart failure ([place]) 04/21/2015  . Acute pulmonary edema (HCC)   . AKI (acute kidney injury) ([place])   .  Peripheral edema   . Acute on chronic respiratory failure with hypoxia ([place]) 04/17/2015  . CAP (community acquired pneumonia) 04/17/2015  . Acute kidney injury superimposed on chronic kidney disease ([place]) 04/17/2015  . Hypotension 04/17/2015  . Chronic respiratory failure with hypoxia ([place]) 02/09/2015  . Dyspnea and respiratory abnormality 01/03/2015  .  Normal coronary arteries 11/02/2014  . Morbid obesity ([place]) 10/30/2014  . Dysphagia 08/21/2014  . Sore throat 07/18/2014  . Chronic fatigue 06/21/2014  . DOE (dyspnea on exertion) 05/24/2014  . High risk medication use 04/17/2014  . Aphasia 02/12/2014  . 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 ([place]) 01/18/2014  . Lumbar facet arthropathy 01/18/2014  . Acute bronchitis 07/24/2013  . Bilateral edema of lower extremity 05/30/2013  . Chronic cough 05/24/2013  . ILD (interstitial lung disease) ([place]) 04/10/2013  . Greater trochanteric bursitis of left hip 08/13/2012  . Spinal stenosis of lumbar region with radiculopathy 07/12/2012  . Inability to walk 07/12/2012  . Acute lumbar back pain 07/12/2012  . Asymptomatic bacteriuria 07/12/2012  . 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 ([place]) 02/17/2012  . Benign neoplasm of meninges ([place]) 02/12/2012  . Brain mass 01/28/2012  . Chest pain with moderate risk of acute coronary syndrome 01/28/2012  . Rheumatoid arthritis ([place]) 01/28/2012  . Primary osteoarthritis of right knee 01/28/2012  . Dyslipidemia   . Thyroid disease   . Essential hypertension    Past Medical History:  Diagnosis Date  . Acute on chronic diastolic (congestive) heart failure ([place])   . Anemia    iron deficiency anemia - secondary to blood loss ( chronic)   . Arthritis    endstage changes bilateral knees/bilateral ankles.   . Asthma   . Carotid artery occlusion   . Chronic fatigue   . Chronic kidney disease   . Closed left hip fracture ([place])   . Clotting disorder ([place])    pt denies this  . Contusion of left knee    due to fall 1/14.  [name] COPD (chronic obstructive pulmonary disease) (HCC)    pulmonary fibrosis  . Depression, reactive   . Diabetes mellitus    type II   . Diastolic dysfunction   . Difficulty in walking   . Family history of heart disease   . Generalized muscle weakness   . Gout   . High cholesterol   . History of  falling   . Hypertension   . Hypothyroidism   . Interstitial lung disease ([place])   . 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    . Morbid obesity ([place])   . Morbid obesity ([place])   . Neuromuscular disorder ([place])    diabetic neuropathy   . Normal coronary arteries    cardiac catheterization performed  10/31/14  . RA (rheumatoid arthritis) ([place])    has been off methotreaxte since 10/13.  [name] Rheumatoid arthritis ([place])   . Shortness of breath   . Spinal stenosis of lumbar region   . Thyroid disease   . Unspecified lack of coordination   . URI (upper respiratory infection)    Family History  Problem Relation Age of Onset  . Diabetes Mother   . Heart attack Mother   . Hypertension Father   . Lung cancer Father   . Diabetes Sister   . Diabetes Brother   . Hypertension Brother   . Heart disease Brother   . Heart attack Brother   .  Kidney cancer Brother   . Uterine cancer Daughter   . Breast cancer Sister   . Rheum arthritis Maternal Uncle   . Gout Brother   . Kidney failure Brother        x 5   Past Surgical History:  Procedure Laterality Date  . ABDOMINAL HYSTERECTOMY    . BRAIN SURGERY     Gamma knife 10/13. Needs repeat spring  '14  . CARDIAC CATHETERIZATION N/A 10/31/2014   Procedure: Right/Left Heart Cath and Coronary Angiography;  Surgeon: [name], MD;  Location: [facility];  Service: Cardiovascular;  Laterality: N/A;  . ESOPHAGOGASTRODUODENOSCOPY (EGD) WITH PROPOFOL N/A 09/14/2014   Procedure: ESOPHAGOGASTRODUODENOSCOPY (EGD) WITH PROPOFOL;  Surgeon: [name], MD;  Location: [facility];  Service: Endoscopy;  Laterality: N/A;  . INCISION AND DRAINAGE HIP Left 01/16/2017   Procedure: IRRIGATION AND DEBRIDEMENT LEFT HIP;  Surgeon: [name], MD;  Location: [facility];  Service: Orthopedics;  Laterality: Left;  . INTRAMEDULLARY (IM) NAIL  INTERTROCHANTERIC Left 11/29/2016   Procedure: INTRAMEDULLARY (IM) NAIL INTERTROCHANTRIC;  Surgeon: [name], MD;  Location: [facility];  Service: Orthopedics;  Laterality: Left;  . OVARY SURGERY    . SHOULDER SURGERY Left   . TONSILLECTOMY  age 48  . VIDEO BRONCHOSCOPY Bilateral 05/31/2013   Procedure: VIDEO BRONCHOSCOPY WITHOUT FLUORO;  Surgeon: Brand Males, MD;  Location: [facility];  Service: Cardiopulmonary;  Laterality: Bilateral;  . video bronscoscopy  2000   lung   Social History   Occupational History  . Occupation: retired Marine scientist  Tobacco Use  . Smoking status: Never Smoker  . Smokeless tobacco: Never Used  Substance and Sexual Activity  . Alcohol use: No  . Drug use: No  . Sexual activity: Not Currently

## 2018-03-19 NOTE — Progress Notes (Signed)
COMMUNITY PALLIATIVE CARE RN NOTE  PATIENT NAME: [name] DOB: 06-24-1940 MRN: [id number]  PRIMARY CARE PROVIDER: Glendale Chard, MD  RESPONSIBLE PARTY:  Acct ID - Guarantor Home Phone Work Phone Relationship Acct Type  [id number] BRYNNLEE, [phone] Self P/F     [address], [name], [place] [address]    PLAN OF CARE and INTERVENTION:  1. ADVANCE CARE PLANNING/GOALS OF CARE: Remain at home with her husband and avoid going to the hospital 2. PATIENT/CAREGIVER EDUCATION: Reinforced Safe Mobility/Transfers, Fall Prevention and Pain Management 3. DISEASE STATUS: Joint visit made with Palliative Care SW, Lynn Duffy. Met with patient in her dining room. She is awake and alert. Pleasant mood and conversational. She reports feeling tired, as she has had multiple appointments this week. She saw her Optometrist yesterday and states that she ambulated with her walker, vs using her wheelchair into and out of the office. Today she feels that she is "paying" for this physically. She admits to falling out of her rolling office chair in her bedroom, reaching for something she had dropped on the floor. Since then she has been experiencing increased R leg and knee pain. Her son has removed this chair from her room and placed upstairs. She continues with her Morphine ER and IR to help manage her pain. She has an appointment with Outpatient Physical Therapy today. She continues with some dyspnea with exertion, but is able to recover rather quickly at rest. She wears her Oxygen on occasion during the day and continuously during the night. She remains able to ambulate using her walker, bathe and dress herself, however she requires frequent rest periods. Will continue to monitor.   HISTORY OF PRESENT ILLNESS:  This is a 77 yo female who resides at home with her husband. Her son also lives with her to assist with household chores and other needs. Palliative Care Team continues to follow  patient. Will continue to visit monthly and PRN.   CODE STATUS: DNR ADVANCED DIRECTIVES: N MOST FORM: no PPS: 50%   PHYSICAL EXAM:   VITALS: Today's Vitals   03/17/18 1042  BP: 130/70  Pulse: (!) 103  Resp: 18  Temp: 97.7 F (36.5 C)  TempSrc: Temporal  SpO2: 97%  PainSc: 4   PainLoc: Leg    LUNGS: clear to auscultation  CARDIAC: Cor Tachy EXTREMITIES: 2+ edema bilaterally lower extremities SKIN: Exposed skin is dry and intact  NEURO: Alert and oriented x 3, forgetful, generalized weakness, ambulatory with walker   (Duration of visit and documentation 60 minutes)    Daryl Eastern, RN, BSN

## 2018-03-19 NOTE — Progress Notes (Signed)
COMMUNITY PALLIATIVE CARE SW NOTE  PATIENT NAME: [name] DOB: 07-03-1940 MRN: [id number]  PRIMARY CARE PROVIDER: Glendale Chard, MD  RESPONSIBLE PARTY:  Acct ID - Guarantor Home Phone Work Phone Relationship Acct Type  [id number] RADLEY, [phone] Self P/F     [address], [name], [place] [address]     PLAN OF CARE and INTERVENTIONS:             1. GOALS OF CARE/ ADVANCE CARE PLANNING:  Patient's goal is to remain at home with her husband.  She has a DNR. 2. SOCIAL/EMOTIONAL/SPIRITUAL ASSESSMENT/ INTERVENTIONS:  SW and Palliative Care RN, Daryl Eastern, met with patient in her home.  She stated she fell out of her desk chair recently and was sore, especially her right knee.  She said her breathing was better and she continues to go to outpatient physical therapy.  Her sister-in-law died yesterday and she expressed feelings of grief. 3. PATIENT/CAREGIVER EDUCATION/ COPING:  She expresses her feelings openly to the Palliative Care Team, but says she likes to appear strong for her family. 4. PERSONAL EMERGENCY PLAN:  Patient keeps a cell phone with her at all times.  She will inform her husband or children with any medical concerns.  She will also rest if she feels overexerted.  5. COMMUNITY RESOURCES COORDINATION/ HEALTH CARE NAVIGATION:  Patient's son lives with them and assists with ADLs.  His daughter moved to [place] with her baby.  Patient has also utilized [facility] for the death of her daughter last 2022-09-20. 6. FINANCIAL/LEGAL CONCERNS/INTERVENTIONS:  None per patient.     SOCIAL HX:  Social History   Tobacco Use  . Smoking status: Never Smoker  . Smokeless tobacco: Never Used  Substance Use Topics  . Alcohol use: No    CODE STATUS:  DNR  ADVANCED DIRECTIVES: N MOST FORM COMPLETE:  N HOSPICE EDUCATION PROVIDED:  N PPS:  Patient reports her appetite is normal.  She stands independently and uses her walker in the  home. Duration of visit and documentation:  60 minutes.      Creola Corn Creola Krotz, LCSW

## 2018-03-24 ENCOUNTER — Ambulatory Visit: Payer: Medicare Other | Attending: Orthopaedic Surgery | Admitting: Physical Therapy

## 2018-03-24 ENCOUNTER — Encounter: Payer: Self-pay | Admitting: Physical Therapy

## 2018-03-24 DIAGNOSIS — R2689 Other abnormalities of gait and mobility: Secondary | ICD-10-CM | POA: Insufficient documentation

## 2018-03-24 DIAGNOSIS — M545 Low back pain, unspecified: Secondary | ICD-10-CM

## 2018-03-24 DIAGNOSIS — G8929 Other chronic pain: Secondary | ICD-10-CM | POA: Diagnosis not present

## 2018-03-24 DIAGNOSIS — M79605 Pain in left leg: Secondary | ICD-10-CM | POA: Insufficient documentation

## 2018-03-24 NOTE — Therapy (Signed)
[place], [place], [address] Phone: [phone]   Fax:  [phone]  Physical Therapy Treatment  Patient Details  Name: [name] MRN: [id number] Date of Birth: 05/04/1941 Referring Provider (PT): Zollie Beckers MD   Encounter Date: 03/24/2018  PT End of Session - 03/24/18 1145    Visit Number  12    Number of Visits  17    Date for PT Re-Evaluation  04/07/18    Authorization Type  medicare progress note at 10th visit and Kx mod by 15th visit    PT Start Time  1146    PT Stop Time  1231    PT Time Calculation (min)  45 min    Activity Tolerance  Patient tolerated treatment well    Behavior During Therapy  [facility] for tasks assessed/performed       Past Medical History:  Diagnosis Date  . Acute on chronic diastolic (congestive) heart failure ([place])   . Anemia    iron deficiency anemia - secondary to blood loss ( chronic)   . Arthritis    endstage changes bilateral knees/bilateral ankles.   . Asthma   . Carotid artery occlusion   . Chronic fatigue   . Chronic kidney disease   . Closed left hip fracture ([place])   . Clotting disorder ([place])    pt denies this  . Contusion of left knee    due to fall 1/14.  [name] COPD (chronic obstructive pulmonary disease) (HCC)    pulmonary fibrosis  . Depression, reactive   . Diabetes mellitus    type II   . Diastolic dysfunction   . Difficulty in walking   . Family history of heart disease   . Generalized muscle weakness   . Gout   . High cholesterol   . History of falling   . Hypertension   . Hypothyroidism   . Interstitial lung disease ([place])   . 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    . Morbid obesity ([place])   . Morbid obesity ([place])   . Neuromuscular disorder ([place])    diabetic neuropathy   . Normal coronary arteries    cardiac catheterization performed   10/31/14  . RA (rheumatoid arthritis) ([place])    has been off methotreaxte since 10/13.  [name] Rheumatoid arthritis ([place])   . Shortness of breath   . Spinal stenosis of lumbar region   . Thyroid disease   . Unspecified lack of coordination   . URI (upper respiratory infection)     Past Surgical History:  Procedure Laterality Date  . ABDOMINAL HYSTERECTOMY    . BRAIN SURGERY     Gamma knife 10/13. Needs repeat spring  '14  . CARDIAC CATHETERIZATION N/A 10/31/2014   Procedure: Right/Left Heart Cath and Coronary Angiography;  Surgeon: [name], MD;  Location: [place] [facility];  Service: Cardiovascular;  Laterality: N/A;  . ESOPHAGOGASTRODUODENOSCOPY (EGD) WITH PROPOFOL N/A 09/14/2014   Procedure: ESOPHAGOGASTRODUODENOSCOPY (EGD) WITH PROPOFOL;  Surgeon: [name], MD;  [facility];  Service: Endoscopy;  Laterality: N/A;  . INCISION AND DRAINAGE HIP Left 01/16/2017   Procedure: IRRIGATION AND DEBRIDEMENT LEFT HIP;  Surgeon: [name], MD;  [facility]: [facility];  Service: Orthopedics;  Laterality: Left;  . INTRAMEDULLARY (IM) NAIL INTERTROCHANTERIC Left 11/29/2016   Procedure: INTRAMEDULLARY (IM) NAIL INTERTROCHANTRIC;  Surgeon: [name], MD;  Location:  [place] [facility];  Service: Orthopedics;  Laterality: Left;  . OVARY SURGERY    . SHOULDER SURGERY Left   . TONSILLECTOMY  age 77  . VIDEO BRONCHOSCOPY Bilateral 05/31/2013   Procedure: VIDEO BRONCHOSCOPY WITHOUT FLUORO;  Surgeon: Brand Males, MD;  Location: [place];  Service: Cardiopulmonary;  Laterality: Bilateral;  . video bronscoscopy  2000   lung    There were no vitals filed for this visit.  Subjective Assessment - 03/24/18 1154    Subjective  "I have been having more soreness in the R leg and in my low back the stiffness has improved but the pain continues to stay there" pt reports having an appointment to see an orthopedic MD tomorrow.     Currently in Pain?  Yes    Pain Score  5      Pain Orientation  Right    Pain Onset  More than a month ago    Pain Frequency  Intermittent    Aggravating Factors   standing/ walking    Pain Relieving Factors  warm shower, exercise stretching.                       [facility] Adult PT Treatment/Exercise - 03/24/18 0001      Exercises   Exercises  Knee/Hip;Lumbar      Lumbar Exercises: Stretches   Lower Trunk Rotation Limitations  2 x 10 bil      Knee/Hip Exercises: Stretches   Active Hamstring Stretch  4 reps;Right;30 seconds   PNF contract/ relax with 10 sec hold     Knee/Hip Exercises: Aerobic   Nustep  L5 x 6 min UE/LE      Knee/Hip Exercises: Standing   Other Standing Knee Exercises  marching 2 x 20 with using RW for safety      Knee/Hip Exercises: Supine   Straight Leg Raises  Right;Strengthening;2 sets;10 reps    Other Supine Knee/Hip Exercises  clamshells 2 x 20 with green theraband      Manual Therapy   Manual therapy comments  MTPR along distal semi-tendinosus working distal to proximal    Joint Mobilization  LAD on the RLE grade 4          Balance Exercises - 03/24/18 1246      Balance Exercises: Standing   Standing Eyes Opened  2 reps;30 secs;Narrow base of support (BOS)    Tandem Stance  Eyes open;2 reps;30 secs   alternating lead leg       PT Education - 03/24/18 1248    Education Details  benefits of hamstring stretching especially sitting when the back is sore"     Person(s) Educated  Patient    Methods  Explanation;Verbal cues    Comprehension  Verbalized understanding;Verbal cues required       PT Short Term Goals - 03/10/18 1215      PT SHORT TERM GOAL #1   Title  pt to be I with initial HEP     Time  4    Period  Weeks    Status  Achieved      PT SHORT TERM GOAL #2   Title  pt to verbalize/ demo proper posture and gait mechanics to prevent and reduce hip and low back pain     Baseline  has practiced but continues require cues    Period  Weeks    Status  On-going       PT SHORT TERM GOAL #3   Title  pt to be able to stand / ambulate >/= 10 with LRAD with </= 6/10 pain to promote therapeutic progression     Baseline  able to walk 10 min but reports pain increases to 6-7/10    Time  4    Period  Weeks    Status  On-going        PT Long Term Goals - 03/10/18 1216      PT LONG TERM GOAL #1   Title  pt to increase bil Le strength to >/= 4/5 to promote hip/ knee stability and maximzie safety with prolonged standing/ walking activity    Time  8    Period  Weeks    Status  On-going    Target Date  04/07/18      PT LONG TERM GOAL #2   Title  pt to be able to ambulate >/= 30 min with LRAD reporting </= 4/10 pain for functional endurance for community and in home ambulation    Time  8    Period  Weeks    Status  On-going    Target Date  04/07/18      PT LONG TERM GOAL #3   Title  pt increase trunk mobility by >/= 10 degrees in flexion and sidebending to with </= 3/10 pain to assist with functional mobility required for ADL s    Time  8    Period  Weeks    Status  On-going    Target Date  04/07/18      PT LONG TERM GOAL #4   Title  incresae FOTO score to </= 60% limited to demo improvement in function    Baseline  25% limited on FOTO     Period  Weeks    Status  On-going    Target Date  04/07/18      PT LONG TERM GOAL #5   Title  pt to be I with all HEP given as of last visit to maintain and progress current level of function     Baseline  progress with HEP as able.     Time  8    Period  Weeks    Status  On-going    Target Date  04/07/18            Plan - 03/24/18 1146    Clinical Impression Statement  pt reported increased soreness inthe R leg and R low back which she attributes to her fall last a couple weeks ago. pt demosntrated potential SIJ invovlement focusing on hamstring stretching and SLR. following LAD pt reported pain dropped and she felt much better standing/ walking compared to coming in.     PT Next Visit Plan    hamstring stretching gently, core activation, gait trainining and endurance, hip strengtheing, LAD to promote hip flexor activation.  balance training: if she does well may add corner balance to HEP    PT Home Exercise Plan  lower trunk rotation, gentle hamstring stretching (sitting and supine), posterior pelvic tilt. supine marching. standing marching using RW sit to stand at sinnk, sitting pelvic tilt,   YOGA walking ( no handout)    Consulted and Agree with Plan of Care  Patient       Patient will benefit from skilled therapeutic intervention in order to improve the following deficits and impairments:  Abnormal gait, Pain, Decreased activity tolerance, Decreased balance, Postural dysfunction, Improper body mechanics, Decreased strength  Visit Diagnosis: Chronic bilateral low back pain without sciatica  Pain  in left leg  Other abnormalities of gait and mobility     Problem List Patient Active Problem List   Diagnosis Date Noted  . Chronic bilateral low back pain without sciatica 02/17/2018  . Pain in left hip 02/18/2017  . Wound infection after surgery, initial encounter 01/16/2017  . Closed nondisplaced intertrochanteric fracture of left femur with delayed healing 01/14/2017  . Postoperative wound infection 01/14/2017  . Gout 11/29/2016  . Depression 11/29/2016  . Closed left hip fracture ([place]) 11/29/2016  . GERD (gastroesophageal reflux disease) 11/29/2016  . Closed intertrochanteric fracture of hip, left, initial encounter ([place]) 11/29/2016  . Physical deconditioning 07/31/2015  . Pulmonary fibrosis, postinflammatory ([place]) 07/31/2015  . Tendinitis of left rotator cuff 06/13/2015  . Acute on chronic diastolic heart failure ([place]) 04/21/2015  . Acute pulmonary edema (HCC)   . AKI (acute kidney injury) ([place])   . Peripheral edema   . Acute on chronic respiratory failure with hypoxia ([place]) 04/17/2015  . CAP (community acquired pneumonia) 04/17/2015  . Acute kidney injury  superimposed on chronic kidney disease ([place]) 04/17/2015  . Hypotension 04/17/2015  . Chronic respiratory failure with hypoxia ([place]) 02/09/2015  . Dyspnea and respiratory abnormality 01/03/2015  . Normal coronary arteries 11/02/2014  . Morbid obesity ([place]) 10/30/2014  . Dysphagia 08/21/2014  . Sore throat 07/18/2014  . Chronic fatigue 06/21/2014  . DOE (dyspnea on exertion) 05/24/2014  . High risk medication use 04/17/2014  . Aphasia 02/12/2014  . 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 ([place]) 01/18/2014  . Lumbar facet arthropathy 01/18/2014  . Acute bronchitis 07/24/2013  . Bilateral edema of lower extremity 05/30/2013  . Chronic cough 05/24/2013  . ILD (interstitial lung disease) ([place]) 04/10/2013  . Greater trochanteric bursitis of left hip 08/13/2012  . Spinal stenosis of lumbar region with radiculopathy 07/12/2012  . Inability to walk 07/12/2012  . Acute lumbar back pain 07/12/2012  . Asymptomatic bacteriuria 07/12/2012  . 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 ([place]) 02/17/2012  . Benign neoplasm of meninges ([place]) 02/12/2012  . Brain mass 01/28/2012  . Chest pain with moderate risk of acute coronary syndrome 01/28/2012  . Rheumatoid arthritis ([place]) 01/28/2012  . Primary osteoarthritis of right knee 01/28/2012  . Dyslipidemia   . Thyroid disease   . Essential hypertension    Starr [place] PT, DPT, LAT, ATC  03/24/18  12:49 PM      [facility] [address] [place], [place], [address] Phone: [phone]   Fax:  [phone]  Name: VERNITA TAGUE MRN: [id number] Date of Birth: 1940-07-14

## 2018-03-25 ENCOUNTER — Encounter (INDEPENDENT_AMBULATORY_CARE_PROVIDER_SITE_OTHER): Payer: Self-pay | Admitting: Orthopaedic Surgery

## 2018-03-25 ENCOUNTER — Ambulatory Visit (INDEPENDENT_AMBULATORY_CARE_PROVIDER_SITE_OTHER): Payer: Medicare Other | Admitting: Orthopaedic Surgery

## 2018-03-25 DIAGNOSIS — G8929 Other chronic pain: Secondary | ICD-10-CM

## 2018-03-25 DIAGNOSIS — M545 Low back pain: Secondary | ICD-10-CM

## 2018-03-25 DIAGNOSIS — M25561 Pain in right knee: Secondary | ICD-10-CM

## 2018-03-25 NOTE — Progress Notes (Signed)
Office Visit Note   Patient: [name]           Date of Birth: 1940-09-28           MRN: [id number] Visit Date: 03/25/2018              Requested by: Glendale Chard, [address] [place], [place] [address] PCP: Glendale Chard, MD   Assessment & Plan: Visit Diagnoses:  1. Chronic bilateral low back pain without sciatica   2. Chronic pain of right knee     Plan: Again we have reiterated the importance of weight loss. The patient meets the AMA guidelines for Morbid (severe) obesity with a BMI > 40.0 and I have recommended weight loss.  We did recommend a steroid shot in her right knee today and she agreed with this as well.  She tolerated it well.  She understands the risk and benefits of steroid injections and how this may affect her blood glucose so she will watch this closely.  She continue therapy and attempts to lose weight and mobilize as much she can.  All question concerns were answered and addressed.  Follow be as needed.  Follow-Up Instructions: Return if symptoms worsen or fail to improve.   Orders:  No orders of the defined types were placed in this encounter.  No orders of the defined types were placed in this encounter.     Procedures: No procedures performed   Clinical Data: No additional findings.   Subjective: Chief Complaint  Patient presents with  . Lower Back - Pain  . Right Knee - Pain  The patient is very well-known to me.  She is incredibly pleasant 77 year old female who does have chronic pain and is on morphine due to this.  This is from a multitude of issues.  She also someone with a BMI of 46 and she is morbidly obese.  She has lumbar spine issues and Dr. Ernestina Patches has provided facet joint injections.  I fixed a broken hip on her before.  She had a fall recently has been having right knee pain would like to have a steroid injection in this today.  She is a diabetic but under good control.  Surgery on her hip was done in July  2018.  She did eventually heal that.  She does use a walker to get around with it is been going to physical therapy which is helped some.  HPI  Review of Systems She currently denies any headache, chest pain, shortness of breath, fever, chills, nausea, vomiting.  Objective: Vital Signs: There were no vitals taken for this visit.  Physical Exam She is alert and oriented x3 in no acute distress Ortho Exam Examination of the right knee shows no instability on exam which is globally.  She does have some pain patches just below her knee on her shin.  There is no evidence of infection.  Her back and hips are stable painful. Specialty Comments:  No specialty comments available.  Imaging: No results found.   PMFS History: Patient Active Problem List   Diagnosis Date Noted  . Chronic bilateral low back pain without sciatica 02/17/2018  . Pain in left hip 02/18/2017  . Wound infection after surgery, initial encounter 01/16/2017  . Closed nondisplaced intertrochanteric fracture of left femur with delayed healing 01/14/2017  . Postoperative wound infection 01/14/2017  . Gout 11/29/2016  . Depression 11/29/2016  . Closed left hip fracture ([place]) 11/29/2016  . GERD (gastroesophageal reflux disease)  11/29/2016  . Closed intertrochanteric fracture of hip, left, initial encounter ([place]) 11/29/2016  . Physical deconditioning 07/31/2015  . Pulmonary fibrosis, postinflammatory ([place]) 07/31/2015  . Tendinitis of left rotator cuff 06/13/2015  . Acute on chronic diastolic heart failure ([place]) 04/21/2015  . Acute pulmonary edema (HCC)   . AKI (acute kidney injury) ([place])   . Peripheral edema   . Acute on chronic respiratory failure with hypoxia ([place]) 04/17/2015  . CAP (community acquired pneumonia) 04/17/2015  . Acute kidney injury superimposed on chronic kidney disease ([place]) 04/17/2015  . Hypotension 04/17/2015  . Chronic respiratory failure with hypoxia ([place]) 02/09/2015  . Dyspnea and  respiratory abnormality 01/03/2015  . Normal coronary arteries 11/02/2014  . Morbid obesity ([place]) 10/30/2014  . Dysphagia 08/21/2014  . Sore throat 07/18/2014  . Chronic fatigue 06/21/2014  . DOE (dyspnea on exertion) 05/24/2014  . High risk medication use 04/17/2014  . Aphasia 02/12/2014  . 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 Lumbar facet arthropathy 01/18/2014  . Acute bronchitis 07/24/2013  . Bilateral edema of lower extremity 05/30/2013  . Chronic cough 05/24/2013  . ILD (interstitial lung disease) ([place]) 04/10/2013  . Greater trochanteric bursitis of left hip 08/13/2012  . Spinal stenosis of lumbar region with radiculopathy 07/12/2012  . Inability to walk 07/12/2012  . Acute lumbar back pain 07/12/2012  . Asymptomatic bacteriuria 07/12/2012  . 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 ([place]) 02/17/2012  . Benign neoplasm of meninges ([place]) 02/12/2012  . Brain mass 01/28/2012  . Chest pain with moderate risk of acute coronary syndrome 01/28/2012  . Rheumatoid arthritis ([place]) 01/28/2012  . Primary osteoarthritis of right knee 01/28/2012  . Dyslipidemia   . Thyroid disease   . Essential hypertension    Past Medical History:  Diagnosis Date  . Acute on chronic diastolic (congestive) heart failure ([place])   . Anemia    iron deficiency anemia - secondary to blood loss ( chronic)   . Arthritis    endstage changes bilateral knees/bilateral ankles.   . Asthma   . Carotid artery occlusion   . Chronic fatigue   . Chronic kidney disease   . Closed left hip fracture ([place])   . Clotting disorder ([place])    pt denies this  . Contusion of left knee    due to fall 1/14.  [name] COPD (chronic obstructive pulmonary disease) (HCC)    pulmonary fibrosis  . Depression, reactive   . Diabetes mellitus    type II   . Diastolic dysfunction   . Difficulty in walking   . Family history of heart disease   . Generalized muscle weakness   . Gout   .  High cholesterol   . History of falling   . Hypertension   . Hypothyroidism   . Interstitial lung disease ([place])   . 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    . Morbid obesity ([place])   . Morbid obesity ([place])   . Neuromuscular disorder ([place])    diabetic neuropathy   . Normal coronary arteries    cardiac catheterization performed  10/31/14  . RA (rheumatoid arthritis) ([place])    has been off methotreaxte since 10/13.  [name] Rheumatoid arthritis ([place])   . Shortness of breath   . Spinal stenosis of lumbar region   . Thyroid disease   . Unspecified lack of coordination   . URI (upper respiratory  infection)     Family History  Problem Relation Age of Onset  . Diabetes Mother   . Heart attack Mother   . Hypertension Father   . Lung cancer Father   . Diabetes Sister   . Diabetes Brother   . Hypertension Brother   . Heart disease Brother   . Heart attack Brother   . Kidney cancer Brother   . Uterine cancer Daughter   . Breast cancer Sister   . Rheum arthritis Maternal Uncle   . Gout Brother   . Kidney failure Brother        x 5    Past Surgical History:  Procedure Laterality Date  . ABDOMINAL HYSTERECTOMY    . BRAIN SURGERY     Gamma knife 10/13. Needs repeat spring  '14  . CARDIAC CATHETERIZATION N/A 10/31/2014   Procedure: Right/Left Heart Cath and Coronary Angiography;  Surgeon: [name], MD;  Location: [place] [facility];  Service: Cardiovascular;  Laterality: N/A;  . ESOPHAGOGASTRODUODENOSCOPY (EGD) WITH PROPOFOL N/A 09/14/2014   Procedure: ESOPHAGOGASTRODUODENOSCOPY (EGD) WITH PROPOFOL;  Surgeon: [name], MD;  [facility];  Service: Endoscopy;  Laterality: N/A;  . INCISION AND DRAINAGE HIP Left 01/16/2017   Procedure: IRRIGATION AND DEBRIDEMENT LEFT HIP;  Surgeon: [name], MD;  [facility];  Service: Orthopedics;  Laterality: Left;  .  INTRAMEDULLARY (IM) NAIL INTERTROCHANTERIC Left 11/29/2016   Procedure: INTRAMEDULLARY (IM) NAIL INTERTROCHANTRIC;  Surgeon: [name], MD;  Location: [place];  Service: Orthopedics;  Laterality: Left;  . OVARY SURGERY    . SHOULDER SURGERY Left   . TONSILLECTOMY  age 36  . VIDEO BRONCHOSCOPY Bilateral 05/31/2013   Procedure: VIDEO BRONCHOSCOPY WITHOUT FLUORO;  Surgeon: Brand Males, MD;  Location: [place];  Service: Cardiopulmonary;  Laterality: Bilateral;  . video bronscoscopy  2000   lung   Social History   Occupational History  . Occupation: retired Marine scientist  Tobacco Use  . Smoking status: Never Smoker  . Smokeless tobacco: Never Used  Substance and Sexual Activity  . Alcohol use: No  . Drug use: No  . Sexual activity: Not Currently

## 2018-03-26 ENCOUNTER — Ambulatory Visit: Payer: Medicare Other | Admitting: Physical Therapy

## 2018-03-26 DIAGNOSIS — G8929 Other chronic pain: Secondary | ICD-10-CM

## 2018-03-26 DIAGNOSIS — M79605 Pain in left leg: Secondary | ICD-10-CM | POA: Diagnosis not present

## 2018-03-26 DIAGNOSIS — M545 Low back pain, unspecified: Secondary | ICD-10-CM

## 2018-03-26 DIAGNOSIS — R2689 Other abnormalities of gait and mobility: Secondary | ICD-10-CM | POA: Diagnosis not present

## 2018-03-26 NOTE — Therapy (Addendum)
[facility] [place], [place], [address] Phone: [phone]   Fax:  [phone]  Physical Therapy Treatment  Patient Details  Name: [name] MRN: [id number] Date of Birth: October 28, 1940 Referring Provider (PT): Zollie Beckers MD   Encounter Date: 03/26/2018  PT End of Session - 03/26/18 1158    Visit Number  13    Number of Visits  17    Date for PT Re-Evaluation  04/07/18    Authorization Type  medicare progress note at 10th visit and Kx mod by 15th visit    PT Start Time  1150    PT Stop Time  1230    PT Time Calculation (min)  40 min       Past Medical History:  Diagnosis Date  . Acute on chronic diastolic (congestive) heart failure ([place])   . Anemia    iron deficiency anemia - secondary to blood loss ( chronic)   . Arthritis    endstage changes bilateral knees/bilateral ankles.   . Asthma   . Carotid artery occlusion   . Chronic fatigue   . Chronic kidney disease   . Closed left hip fracture ([place])   . Clotting disorder ([place])    pt denies this  . Contusion of left knee    due to fall 1/14.  [name] COPD (chronic obstructive pulmonary disease) (HCC)    pulmonary fibrosis  . Depression, reactive   . Diabetes mellitus    type II   . Diastolic dysfunction   . Difficulty in walking   . Family history of heart disease   . Generalized muscle weakness   . Gout   . High cholesterol   . History of falling   . Hypertension   . Hypothyroidism   . Interstitial lung disease ([place])   . 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    . Morbid obesity ([place])   . Morbid obesity ([place])   . Neuromuscular disorder ([place])    diabetic neuropathy   . Normal coronary arteries    cardiac catheterization performed  10/31/14  . RA (rheumatoid arthritis) ([place])    has been off methotreaxte since 10/13.  [name] Rheumatoid arthritis ([place])   .  Shortness of breath   . Spinal stenosis of lumbar region   . Thyroid disease   . Unspecified lack of coordination   . URI (upper respiratory infection)     Past Surgical History:  Procedure Laterality Date  . ABDOMINAL HYSTERECTOMY    . BRAIN SURGERY     Gamma knife 10/13. Needs repeat spring  '14  . CARDIAC CATHETERIZATION N/A 10/31/2014   Procedure: Right/Left Heart Cath and Coronary Angiography;  Surgeon: [name], MD;  Location: [facility];  Service: Cardiovascular;  Laterality: N/A;  . ESOPHAGOGASTRODUODENOSCOPY (EGD) WITH PROPOFOL N/A 09/14/2014   Procedure: ESOPHAGOGASTRODUODENOSCOPY (EGD) WITH PROPOFOL;  Surgeon: [name], MD;  [facility];  Service: Endoscopy;  Laterality: N/A;  . INCISION AND DRAINAGE HIP Left 01/16/2017   Procedure: IRRIGATION AND DEBRIDEMENT LEFT HIP;  Surgeon: [name], MD;  [facility]: [facility];  Service: Orthopedics;  Laterality: Left;  . INTRAMEDULLARY (IM) NAIL INTERTROCHANTERIC Left 11/29/2016   Procedure: INTRAMEDULLARY (IM) NAIL INTERTROCHANTRIC;  Surgeon: [name], MD;  Location: [facility];  Service: Orthopedics;  Laterality: Left;  . OVARY SURGERY    . SHOULDER SURGERY Left   .  TONSILLECTOMY  age 89  . VIDEO BRONCHOSCOPY Bilateral 05/31/2013   Procedure: VIDEO BRONCHOSCOPY WITHOUT FLUORO;  Surgeon: Brand Males, MD;  Location: [facility];  Service: Cardiopulmonary;  Laterality: Bilateral;  . video bronscoscopy  2000   lung    There were no vitals filed for this visit.  Subjective Assessment - 03/26/18 1157    Subjective  I felt better after I left last visit. Pulling on my leg was helpful     Currently in Pain?  Yes    Pain Score  3     Pain Location  Leg    Pain Orientation  Right    Pain Descriptors / Indicators  Aching                       [facility] Adult PT Treatment/Exercise - 03/26/18 0001      Ambulation/Gait   Ambulation/Gait  Yes    Ambulation/Gait  Assistance  6: Modified independent (Device/Increase time)    Ambulation Distance (Feet)  210 Feet   2 min 40 sec    Assistive device  Rolling walker    Gait velocity  1.31 ft/sec      Lumbar Exercises: Stretches   Lower Trunk Rotation Limitations  2 x 10 bil      Knee/Hip Exercises: Stretches   Active Hamstring Stretch  4 reps;Right;30 seconds   PNF contract/ relax with 10 sec hold   Active Hamstring Stretch Limitations  seated 1 x 60 sec       Knee/Hip Exercises: Aerobic   Nustep  L5 x 8 minutes LE only       Knee/Hip Exercises: Standing   Other Standing Knee Exercises  marching 1 x 20 with using RW for safety    Other Standing Knee Exercises  hip abduction x 10 each , heel raises x 10       Manual Therapy   Manual therapy comments  massage roller to hamstring -decreased medial tenderness after    Joint Mobilization  LAD on the RLE grade 3               PT Short Term Goals - 03/10/18 1215      PT SHORT TERM GOAL #1   Title  pt to be I with initial HEP     Time  4    Period  Weeks    Status  Achieved      PT SHORT TERM GOAL #2   Title  pt to verbalize/ demo proper posture and gait mechanics to prevent and reduce hip and low back pain     Baseline  has practiced but continues require cues    Period  Weeks    Status  On-going      PT SHORT TERM GOAL #3   Title  pt to be able to stand / ambulate >/= 10 with LRAD with </= 6/10 pain to promote therapeutic progression     Baseline  able to walk 10 min but reports pain increases to 6-7/10    Time  4    Period  Weeks    Status  On-going        PT Long Term Goals - 03/10/18 1216      PT LONG TERM GOAL #1   Title  pt to increase bil Le strength to >/= 4/5 to promote hip/ knee stability and maximzie safety with prolonged standing/ walking activity    Time  8    Period  Weeks    Status  On-going    Target Date  04/07/18      PT LONG TERM GOAL #2   Title  pt to be able to ambulate >/= 30 min with LRAD  reporting </= 4/10 pain for functional endurance for community and in home ambulation    Time  8    Period  Weeks    Status  On-going    Target Date  04/07/18      PT LONG TERM GOAL #3   Title  pt increase trunk mobility by >/= 10 degrees in flexion and sidebending to with </= 3/10 pain to assist with functional mobility required for ADL s    Time  8    Period  Weeks    Status  On-going    Target Date  04/07/18      PT LONG TERM GOAL #4   Title  incresae FOTO score to </= 60% limited to demo improvement in function    Baseline  25% limited on FOTO     Period  Weeks    Status  On-going    Target Date  04/07/18      PT LONG TERM GOAL #5   Title  pt to be I with all HEP given as of last visit to maintain and progress current level of function     Baseline  progress with HEP as able.     Time  8    Period  Weeks    Status  On-going    Target Date  04/07/18            Plan - 03/26/18 1158    Clinical Impression Statement  Pt reports feeling better after last treatment. Pain intensity less however still remains in right posterior thigh and calf. Able to ambulate 2 minutes and 40 seconds prior to needing to rest due to SOB, SPO2 90%. Continued with hip/ knee ROM and LE stretching. Repeated LAD and manual to hamstrings. Less tender and able to flex hip better at end of session per her report.     PT Next Visit Plan   hamstring stretching gently, core activation, gait trainining and endurance, hip strengtheing, LAD to promote hip flexor activation.  balance training: if she does well may add corner balance to HEP    PT Home Exercise Plan  lower trunk rotation, gentle hamstring stretching (sitting and supine), posterior pelvic tilt. supine marching. standing marching using RW sit to stand at sinnk, sitting pelvic tilt,   YOGA walking ( no handout)    Consulted and Agree with Plan of Care  Patient       Patient will benefit from skilled therapeutic intervention in order to improve the  following deficits and impairments:  Abnormal gait, Pain, Decreased activity tolerance, Decreased balance, Postural dysfunction, Improper body mechanics, Decreased strength  Visit Diagnosis: Chronic bilateral low back pain without sciatica  Pain in left leg  Other abnormalities of gait and mobility     Problem List Patient Active Problem List   Diagnosis Date Noted  . Chronic bilateral low back pain without sciatica 02/17/2018  . Pain in left hip 02/18/2017  . Wound infection after surgery, initial encounter 01/16/2017  . Closed nondisplaced intertrochanteric fracture of left femur with delayed healing 01/14/2017  . Postoperative wound infection 01/14/2017  . Gout 11/29/2016  . Depression 11/29/2016  . Closed left hip fracture ([place]) 11/29/2016  . GERD (gastroesophageal reflux disease) 11/29/2016  . Closed intertrochanteric fracture of  hip, left, initial encounter ([place]) 11/29/2016  . Physical deconditioning 07/31/2015  . Pulmonary fibrosis, postinflammatory ([place]) 07/31/2015  . Tendinitis of left rotator cuff 06/13/2015  . Acute on chronic diastolic heart failure ([place]) 04/21/2015  . Acute pulmonary edema (HCC)   . AKI (acute kidney injury) ([place])   . Peripheral edema   . Acute on chronic respiratory failure with hypoxia ([place]) 04/17/2015  . CAP (community acquired pneumonia) 04/17/2015  . Acute kidney injury superimposed on chronic kidney disease ([place]) 04/17/2015  . Hypotension 04/17/2015  . Chronic respiratory failure with hypoxia ([place]) 02/09/2015  . Dyspnea and respiratory abnormality 01/03/2015  . Normal coronary arteries 11/02/2014  . Morbid obesity ([place]) 10/30/2014  . Dysphagia 08/21/2014  . Sore throat 07/18/2014  . Chronic fatigue 06/21/2014  . DOE (dyspnea on exertion) 05/24/2014  . High risk medication use 04/17/2014  . Aphasia 02/12/2014  . 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 Lumbar facet arthropathy 01/18/2014  . Acute bronchitis  07/24/2013  . Bilateral edema of lower extremity 05/30/2013  . Chronic cough 05/24/2013  . ILD (interstitial lung disease) ([place]) 04/10/2013  . Greater trochanteric bursitis of left hip 08/13/2012  . Spinal stenosis of lumbar region with radiculopathy 07/12/2012  . Inability to walk 07/12/2012  . Acute lumbar back pain 07/12/2012  . Asymptomatic bacteriuria 07/12/2012  . 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 ([place]) 02/17/2012  . Benign neoplasm of meninges ([place]) 02/12/2012  . Brain mass 01/28/2012  . Chest pain with moderate risk of acute coronary syndrome 01/28/2012  . Rheumatoid arthritis ([place]) 01/28/2012  . Primary osteoarthritis of right knee 01/28/2012  . Dyslipidemia   . Thyroid disease   . Essential hypertension     Dorene Ar, [place] 03/26/2018, 12:39 PM  [facility] [address] [place], [place], [address] Phone: [phone]   Fax:  [phone]  Name: [name] MRN: [id number] Date of Birth: [birth year]

## 2018-03-30 ENCOUNTER — Ambulatory Visit: Payer: Medicare Other | Admitting: Physical Therapy

## 2018-03-30 ENCOUNTER — Encounter: Payer: Self-pay | Admitting: Physical Therapy

## 2018-03-30 DIAGNOSIS — M545 Low back pain: Principal | ICD-10-CM

## 2018-03-30 DIAGNOSIS — M79605 Pain in left leg: Secondary | ICD-10-CM | POA: Diagnosis not present

## 2018-03-30 DIAGNOSIS — R2689 Other abnormalities of gait and mobility: Secondary | ICD-10-CM

## 2018-03-30 DIAGNOSIS — G8929 Other chronic pain: Secondary | ICD-10-CM | POA: Diagnosis not present

## 2018-03-30 NOTE — Therapy (Signed)
[facility] [place], [place], [address] Phone: [phone]   Fax:  [phone]  Physical Therapy Treatment  Patient Details  Name: [name] MRN: [id number] Date of Birth: 1940-10-23 Referring Provider (PT): Zollie Beckers MD   Encounter Date: 03/30/2018  PT End of Session - 03/30/18 1241    Visit Number  14    Number of Visits  17    Date for PT Re-Evaluation  04/07/18    Authorization Type  medicare progress note at 10th visit and Kx mod by 15th visit    PT Start Time  1153    PT Stop Time  1231    PT Time Calculation (min)  38 min       Past Medical History:  Diagnosis Date  . Acute on chronic diastolic (congestive) heart failure ([place])   . Anemia    iron deficiency anemia - secondary to blood loss ( chronic)   . Arthritis    endstage changes bilateral knees/bilateral ankles.   . Asthma   . Carotid artery occlusion   . Chronic fatigue   . Chronic kidney disease   . Closed left hip fracture ([place])   . Clotting disorder ([place])    pt denies this  . Contusion of left knee    due to fall 1/14.  [name] COPD (chronic obstructive pulmonary disease) (HCC)    pulmonary fibrosis  . Depression, reactive   . Diabetes mellitus    type II   . Diastolic dysfunction   . Difficulty in walking   . Family history of heart disease   . Generalized muscle weakness   . Gout   . High cholesterol   . History of falling   . Hypertension   . Hypothyroidism   . Interstitial lung disease ([place])   . 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    . Morbid obesity ([place])   . Morbid obesity ([place])   . Neuromuscular disorder ([place])    diabetic neuropathy   . Normal coronary arteries    cardiac catheterization performed  10/31/14  . RA (rheumatoid arthritis) ([place])    has been off methotreaxte since 10/13.  [name] Rheumatoid arthritis ([place])   .  Shortness of breath   . Spinal stenosis of lumbar region   . Thyroid disease   . Unspecified lack of coordination   . URI (upper respiratory infection)     Past Surgical History:  Procedure Laterality Date  . ABDOMINAL HYSTERECTOMY    . BRAIN SURGERY     Gamma knife 10/13. Needs repeat spring  '14  . CARDIAC CATHETERIZATION N/A 10/31/2014   Procedure: Right/Left Heart Cath and Coronary Angiography;  Surgeon: [name], MD;  Location: [facility];  Service: Cardiovascular;  Laterality: N/A;  . ESOPHAGOGASTRODUODENOSCOPY (EGD) WITH PROPOFOL N/A 09/14/2014   Procedure: ESOPHAGOGASTRODUODENOSCOPY (EGD) WITH PROPOFOL;  Surgeon: [name], MD;  [facility];  Service: Endoscopy;  Laterality: N/A;  . INCISION AND DRAINAGE HIP Left 01/16/2017   Procedure: IRRIGATION AND DEBRIDEMENT LEFT HIP;  Surgeon: [name], MD;  [facility];  Service: Orthopedics;  Laterality: Left;  . INTRAMEDULLARY (IM) NAIL INTERTROCHANTERIC Left 11/29/2016   Procedure: INTRAMEDULLARY (IM) NAIL INTERTROCHANTRIC;  Surgeon: [name], MD;  Location: [facility];  Service: Orthopedics;  Laterality: Left;  . OVARY SURGERY    . SHOULDER SURGERY Left   .  TONSILLECTOMY  age 66  . VIDEO BRONCHOSCOPY Bilateral 05/31/2013   Procedure: VIDEO BRONCHOSCOPY WITHOUT FLUORO;  Surgeon: Brand Males, MD;  Location: [facility];  Service: Cardiopulmonary;  Laterality: Bilateral;  . video bronscoscopy  2000   lung    There were no vitals filed for this visit.  Subjective Assessment - 03/30/18 1203    Subjective  I feel better. The back of the knee is better since the massage back there.     Currently in Pain?  Yes    Pain Score  3     Pain Location  Knee    Pain Orientation  Right;Anterior    Pain Descriptors / Indicators  Aching    Pain Type  Chronic pain    Aggravating Factors   the fall , weather    Pain Relieving Factors  exercise                        [facility] Adult PT Treatment/Exercise - 03/30/18 0001      Ambulation/Gait   Ambulation/Gait Assistance  6: Modified independent (Device/Increase time)    Ambulation Distance (Feet)  220 Feet   2 min 26 sec   Assistive device  Rolling walker    Gait velocity  1.51   last visit correction 1.31 ft/sec     Knee/Hip Exercises: Aerobic   Nustep  L5 x 10 minutes LE only       Knee/Hip Exercises: Standing   Hip Abduction  2 sets    Other Standing Knee Exercises  hip flexion x 10 each     Other Standing Knee Exercises  hip abduction x 10 each , heel raises x 20      Knee/Hip Exercises: Seated   Sit to Sand  5 reps;with UE support   cues for decreased UE weight on RW and glut squeeze              PT Short Term Goals - 03/10/18 1215      PT SHORT TERM GOAL #1   Title  pt to be I with initial HEP     Time  4    Period  Weeks    Status  Achieved      PT SHORT TERM GOAL #2   Title  pt to verbalize/ demo proper posture and gait mechanics to prevent and reduce hip and low back pain     Baseline  has practiced but continues require cues    Period  Weeks    Status  On-going      PT SHORT TERM GOAL #3   Title  pt to be able to stand / ambulate >/= 10 with LRAD with </= 6/10 pain to promote therapeutic progression     Baseline  able to walk 10 min but reports pain increases to 6-7/10    Time  4    Period  Weeks    Status  On-going        PT Long Term Goals - 03/10/18 1216      PT LONG TERM GOAL #1   Title  pt to increase bil Le strength to >/= 4/5 to promote hip/ knee stability and maximzie safety with prolonged standing/ walking activity    Time  8    Period  Weeks    Status  On-going    Target Date  04/07/18      PT LONG TERM GOAL #2   Title  pt to be able  to ambulate >/= 30 min with LRAD reporting </= 4/10 pain for functional endurance for community and in home ambulation    Time  8    Period  Weeks    Status  On-going    Target  Date  04/07/18      PT LONG TERM GOAL #3   Title  pt increase trunk mobility by >/= 10 degrees in flexion and sidebending to with </= 3/10 pain to assist with functional mobility required for ADL s    Time  8    Period  Weeks    Status  On-going    Target Date  04/07/18      PT LONG TERM GOAL #4   Title  incresae FOTO score to </= 60% limited to demo improvement in function    Baseline  25% limited on FOTO     Period  Weeks    Status  On-going    Target Date  04/07/18      PT LONG TERM GOAL #5   Title  pt to be I with all HEP given as of last visit to maintain and progress current level of function     Baseline  progress with HEP as able.     Time  8    Period  Weeks    Status  On-going    Target Date  04/07/18            Plan - 03/30/18 1227    Clinical Impression Statement  She reports improvement in anterior right knee pain however has mild anterior knee pain today. Slight improvement in gait velocity however SPo2 dropped to 88%. Difficulty with WB into LLE with closed chain however no c/o increased pain during treatment. Asked her to bring her oxygen tank next treatment.     PT Next Visit Plan   hamstring stretching gently, core activation, gait trainining and endurance, hip strengtheing, LAD to promote hip flexor activation.  balance training: if she does well may add corner balance to HEP    PT Home Exercise Plan  lower trunk rotation, gentle hamstring stretching (sitting and supine), posterior pelvic tilt. supine marching. standing marching using RW sit to stand at sinnk, sitting pelvic tilt,   YOGA walking ( no handout)    Consulted and Agree with Plan of Care  Patient       Patient will benefit from skilled therapeutic intervention in order to improve the following deficits and impairments:  Abnormal gait, Pain, Decreased activity tolerance, Decreased balance, Postural dysfunction, Improper body mechanics, Decreased strength  Visit Diagnosis: Chronic bilateral low  back pain without sciatica  Pain in left leg  Other abnormalities of gait and mobility     Problem List Patient Active Problem List   Diagnosis Date Noted  . Chronic bilateral low back pain without sciatica 02/17/2018  . Pain in left hip 02/18/2017  . Wound infection after surgery, initial encounter 01/16/2017  . Closed nondisplaced intertrochanteric fracture of left femur with delayed healing 01/14/2017  . Postoperative wound infection 01/14/2017  . Gout 11/29/2016  . Depression 11/29/2016  . Closed left hip fracture ([place]) 11/29/2016  . GERD (gastroesophageal reflux disease) 11/29/2016  . Closed intertrochanteric fracture of hip, left, initial encounter ([place]) 11/29/2016  . Physical deconditioning 07/31/2015  . Pulmonary fibrosis, postinflammatory ([place]) 07/31/2015  . Tendinitis of left rotator cuff 06/13/2015  . Acute on chronic diastolic heart failure ([place]) 04/21/2015  . Acute pulmonary edema (H[facility])   . AKI (acute kidney injury) (  [place])   . Peripheral edema   . Acute on chronic respiratory failure with hypoxia ([place]) 04/17/2015  . CAP (community acquired pneumonia) 04/17/2015  . Acute kidney injury superimposed on chronic kidney disease ([place]) 04/17/2015  . Hypotension 04/17/2015  . Chronic respiratory failure with hypoxia ([place]) 02/09/2015  . Dyspnea and respiratory abnormality 01/03/2015  . Normal coronary arteries 11/02/2014  . Morbid obesity ([place]) 10/30/2014  . Dysphagia 08/21/2014  . Sore throat 07/18/2014  . Chronic fatigue 06/21/2014  . DOE (dyspnea on exertion) 05/24/2014  . High risk medication use 04/17/2014  . Aphasia 02/12/2014  . 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 Lumbar facet arthropathy 01/18/2014  . Acute bronchitis 07/24/2013  . Bilateral edema of lower extremity 05/30/2013  . Chronic cough 05/24/2013  . ILD (interstitial lung disease) ([place]) 04/10/2013  . Greater trochanteric bursitis of left hip 08/13/2012  . Spinal  stenosis of lumbar region with radiculopathy 07/12/2012  . Inability to walk 07/12/2012  . Acute lumbar back pain 07/12/2012  . Asymptomatic bacteriuria 07/12/2012  . 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 ([place]) 02/17/2012  . Benign neoplasm of meninges ([place]) 02/12/2012  . Brain mass 01/28/2012  . Chest pain with moderate risk of acute coronary syndrome 01/28/2012  . Rheumatoid arthritis ([place]) 01/28/2012  . Primary osteoarthritis of right knee 01/28/2012  . Dyslipidemia   . Thyroid disease   . Essential hypertension     [name], [name] 03/30/2018, 12:57 PM  [facility] [address] [place], [place], [address] Phone: [phone]   Fax:  [phone]  Name: [name] MRN: [id number] Date of Birth: 1940/07/19

## 2018-04-01 ENCOUNTER — Ambulatory Visit: Payer: Medicare Other | Admitting: Physical Therapy

## 2018-04-01 ENCOUNTER — Encounter: Payer: Self-pay | Admitting: Physical Therapy

## 2018-04-01 DIAGNOSIS — G8929 Other chronic pain: Secondary | ICD-10-CM

## 2018-04-01 DIAGNOSIS — R2689 Other abnormalities of gait and mobility: Secondary | ICD-10-CM

## 2018-04-01 DIAGNOSIS — M545 Low back pain, unspecified: Secondary | ICD-10-CM

## 2018-04-01 DIAGNOSIS — M79605 Pain in left leg: Secondary | ICD-10-CM

## 2018-04-01 NOTE — Therapy (Signed)
[place], [place], [address] Phone: [phone]   Fax:  [phone]  Physical Therapy Treatment  Patient Details  Name: [name] MRN: [id number] Date of Birth: 10/30/1940 Referring Provider (PT): Zollie Beckers MD   Encounter Date: 04/01/2018  PT End of Session - 04/01/18 1023    Visit Number  15    Number of Visits  17    Date for PT Re-Evaluation  04/07/18    Authorization Type  medicare progress note at 10th visit and Kx mod by 15th visit    PT Start Time  1017    PT Stop Time  1100    PT Time Calculation (min)  43 min       Past Medical History:  Diagnosis Date  . Acute on chronic diastolic (congestive) heart failure ([place])   . Anemia    iron deficiency anemia - secondary to blood loss ( chronic)   . Arthritis    endstage changes bilateral knees/bilateral ankles.   . Asthma   . Carotid artery occlusion   . Chronic fatigue   . Chronic kidney disease   . Closed left hip fracture ([place])   . Clotting disorder ([place])    pt denies this  . Contusion of left knee    due to fall 1/14.  [name] COPD (chronic obstructive pulmonary disease) (HCC)    pulmonary fibrosis  . Depression, reactive   . Diabetes mellitus    type II   . Diastolic dysfunction   . Difficulty in walking   . Family history of heart disease   . Generalized muscle weakness   . Gout   . High cholesterol   . History of falling   . Hypertension   . Hypothyroidism   . Interstitial lung disease ([place])   . 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    . Morbid obesity ([place])   . Morbid obesity ([place])   . Neuromuscular disorder ([place])    diabetic neuropathy   . Normal coronary arteries    cardiac catheterization performed  10/31/14  . RA (rheumatoid arthritis) ([place])    has been off methotreaxte since 10/13.  [name] Rheumatoid arthritis ([place])   .  Shortness of breath   . Spinal stenosis of lumbar region   . Thyroid disease   . Unspecified lack of coordination   . URI (upper respiratory infection)     Past Surgical History:  Procedure Laterality Date  . ABDOMINAL HYSTERECTOMY    . BRAIN SURGERY     Gamma knife 10/13. Needs repeat spring  '14  . CARDIAC CATHETERIZATION N/A 10/31/2014   Procedure: Right/Left Heart Cath and Coronary Angiography;  Surgeon: [name], MD;  Location: [facility];  Service: Cardiovascular;  Laterality: N/A;  . ESOPHAGOGASTRODUODENOSCOPY (EGD) WITH PROPOFOL N/A 09/14/2014   Procedure: ESOPHAGOGASTRODUODENOSCOPY (EGD) WITH PROPOFOL;  Surgeon: [name], MD;  [facility];  Service: Endoscopy;  Laterality: N/A;  . INCISION AND DRAINAGE HIP Left 01/16/2017   Procedure: IRRIGATION AND DEBRIDEMENT LEFT HIP;  Surgeon: [name], MD;  [facility];  Service: Orthopedics;  Laterality: Left;  . INTRAMEDULLARY (IM) NAIL INTERTROCHANTERIC Left 11/29/2016   Procedure: INTRAMEDULLARY (IM) NAIL INTERTROCHANTRIC;  Surgeon: [name], MD;  Location: [facility];  Service: Orthopedics;  Laterality: Left;  . OVARY SURGERY    . SHOULDER SURGERY Left   .  TONSILLECTOMY  age 45  . VIDEO BRONCHOSCOPY Bilateral 05/31/2013   Procedure: VIDEO BRONCHOSCOPY WITHOUT FLUORO;  Surgeon: Brand Males, MD;  Location: [facility];  Service: Cardiopulmonary;  Laterality: Bilateral;  . video bronscoscopy  2000   lung    There were no vitals filed for this visit.  Subjective Assessment - 04/01/18 1022    Subjective  My legs hurt so bad last night from going out in the cold weather to the store. I had a hard time getting to sleep. I was unable to fill my potable o2 tank in time to bring it with me today.     Pain Score  2     Pain Location  Knee    Pain Orientation  Right    Pain Descriptors / Indicators  Aching                       [facility] Adult PT  Treatment/Exercise - 04/01/18 0001      Ambulation/Gait   Ambulation/Gait Assistance  6: Modified independent (Device/Increase time)    Ambulation Distance (Feet)  185 Feet   2 min 30 sec    Assistive device  Rolling walker    Gait velocity  1.23      Exercises   Exercises  Knee/Hip      Knee/Hip Exercises: Stretches   Other Knee/Hip Stretches  Gastroc stretch using edge of step in // bars 2 x 30 sec each       Knee/Hip Exercises: Aerobic   Nustep  L5 x 12 minutes LE only       Knee/Hip Exercises: Standing   Gait Training  // bars gait without AD , decreased weight on LLE, antalgic, used 1 UE to mimic using cane    Other Standing Knee Exercises  hip flexion x 10 each     Other Standing Knee Exercises  hip abduction x 10 each , heel raises x 20      Knee/Hip Exercises: Seated   Sit to Sand  10 reps;without UE support               PT Short Term Goals - 03/10/18 1215      PT SHORT TERM GOAL #1   Title  pt to be I with initial HEP     Time  4    Period  Weeks    Status  Achieved      PT SHORT TERM GOAL #2   Title  pt to verbalize/ demo proper posture and gait mechanics to prevent and reduce hip and low back pain     Baseline  has practiced but continues require cues    Period  Weeks    Status  On-going      PT SHORT TERM GOAL #3   Title  pt to be able to stand / ambulate >/= 10 with LRAD with </= 6/10 pain to promote therapeutic progression     Baseline  able to walk 10 min but reports pain increases to 6-7/10    Time  4    Period  Weeks    Status  On-going        PT Long Term Goals - 03/10/18 1216      PT LONG TERM GOAL #1   Title  pt to increase bil Le strength to >/= 4/5 to promote hip/ knee stability and maximzie safety with prolonged standing/ walking activity    Time  8    Period  Weeks    Status  On-going    Target Date  04/07/18      PT LONG TERM GOAL #2   Title  pt to be able to ambulate >/= 30 min with LRAD reporting </= 4/10 pain for  functional endurance for community and in home ambulation    Time  8    Period  Weeks    Status  On-going    Target Date  04/07/18      PT LONG TERM GOAL #3   Title  pt increase trunk mobility by >/= 10 degrees in flexion and sidebending to with </= 3/10 pain to assist with functional mobility required for ADL s    Time  8    Period  Weeks    Status  On-going    Target Date  04/07/18      PT LONG TERM GOAL #4   Title  incresae FOTO score to </= 60% limited to demo improvement in function    Baseline  25% limited on FOTO     Period  Weeks    Status  On-going    Target Date  04/07/18      PT LONG TERM GOAL #5   Title  pt to be I with all HEP given as of last visit to maintain and progress current level of function     Baseline  progress with HEP as able.     Time  8    Period  Weeks    Status  On-going    Target Date  04/07/18            Plan - 04/01/18 1040    Clinical Impression Statement  Did not have enough 02 in portable tank to bring today. SP02 did not drop below 90 today with ambulation. Decreased gait velocity today with c/o right knee pain. Able to increase time on Nustep. She reports increased pain in legs yesterday with going out into cold weather but she rates 2/10 today. Continued with conditioning and closed chain LE strengthening as tolerated. Began ait in parallel bars. Added calf stretch. Frequent rest beaks required. She should bring 02 tank next visit.     PT Treatment/Interventions  ADLs/Self Care Home Management;Cryotherapy;Electrical Stimulation;Iontophoresis 42m/ml Dexamethasone;Moist Heat;Ultrasound;Balance training;Stair training;Functional mobility training;Therapeutic activities;Therapeutic exercise;Manual techniques;Taping;Dry needling;Gait training    PT Next Visit Plan  wear supplemental 02 if she brings it. ; continue calf stretching l hamstring stretching gently, core activation, gait trainining and endurance, hip strengtheing, LAD to promote hip  flexor activation.  balance training: if she does well may add corner balance to HEP    PT Home Exercise Plan  lower trunk rotation, gentle hamstring stretching (sitting and supine), posterior pelvic tilt. supine marching. standing marching using RW sit to stand at sinnk, sitting pelvic tilt,   YOGA walking ( no handout)       Patient will benefit from skilled therapeutic intervention in order to improve the following deficits and impairments:  Abnormal gait, Pain, Decreased activity tolerance, Decreased balance, Postural dysfunction, Improper body mechanics, Decreased strength  Visit Diagnosis: Chronic bilateral low back pain without sciatica  Pain in left leg  Other abnormalities of gait and mobility     Problem List Patient Active Problem List   Diagnosis Date Noted  . Chronic bilateral low back pain without sciatica 02/17/2018  . Pain in left hip 02/18/2017  . Wound infection after surgery, initial encounter 01/16/2017  . Closed nondisplaced intertrochanteric fracture of left femur with  delayed healing 01/14/2017  . Postoperative wound infection 01/14/2017  . Gout 11/29/2016  . Depression 11/29/2016  . Closed left hip fracture ([place]) 11/29/2016  . GERD (gastroesophageal reflux disease) 11/29/2016  . Closed intertrochanteric fracture of hip, left, initial encounter ([place]) 11/29/2016  . Physical deconditioning 07/31/2015  . Pulmonary fibrosis, postinflammatory ([place]) 07/31/2015  . Tendinitis of left rotator cuff 06/13/2015  . Acute on chronic diastolic heart failure ([place]) 04/21/2015  . Acute pulmonary edema (H[facility])   . AKI (acute kidney injury) ([place])   . Peripheral edema   . Acute on chronic respiratory failure with hypoxia ([place]) 04/17/2015  . CAP (community acquired pneumonia) 04/17/2015  . Acute kidney injury superimposed on chronic kidney disease ([place]) 04/17/2015  . Hypotension 04/17/2015  . Chronic respiratory failure with hypoxia ([place]) 02/09/2015  . Dyspnea and respiratory  abnormality 01/03/2015  . Normal coronary arteries 11/02/2014  . Morbid obesity ([place]) 10/30/2014  . Dysphagia 08/21/2014  . Sore throat 07/18/2014  . Chronic fatigue 06/21/2014  . DOE (dyspnea on exertion) 05/24/2014  . High risk medication use 04/17/2014  . Aphasia 02/12/2014  . 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 ([place]) 01/18/2014  . Lumbar facet arthropathy 01/18/2014  . Acute bronchitis 07/24/2013  . Bilateral edema of lower extremity 05/30/2013  . Chronic cough 05/24/2013  . ILD (interstitial lung disease) ([place]) 04/10/2013  . Greater trochanteric bursitis of left hip 08/13/2012  . Spinal stenosis of lumbar region with radiculopathy 07/12/2012  . Inability to walk 07/12/2012  . Acute lumbar back pain 07/12/2012  . Asymptomatic bacteriuria 07/12/2012  . 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 ([place]) 02/17/2012  . Benign neoplasm of meninges ([place]) 02/12/2012  . Brain mass 01/28/2012  . Chest pain with moderate risk of acute coronary syndrome 01/28/2012  . Rheumatoid arthritis ([place]) 01/28/2012  . Primary osteoarthritis of right knee 01/28/2012  . Dyslipidemia   . Thyroid disease   . Essential hypertension     Dorene Ar , [place] 04/01/2018, 11:08 AM  [facility] [address] [place], [place], [address] Phone: [phone]   Fax:  [phone]  Name: FATIM VANDERSCHAAF MRN: [id number] Date of Birth: Feb 05, 1941

## 2018-04-05 ENCOUNTER — Ambulatory Visit: Payer: Medicare Other | Admitting: Physical Therapy

## 2018-04-05 ENCOUNTER — Encounter: Payer: Self-pay | Admitting: Physical Therapy

## 2018-04-05 DIAGNOSIS — R2689 Other abnormalities of gait and mobility: Secondary | ICD-10-CM | POA: Diagnosis not present

## 2018-04-05 DIAGNOSIS — M79605 Pain in left leg: Secondary | ICD-10-CM

## 2018-04-05 DIAGNOSIS — G8929 Other chronic pain: Secondary | ICD-10-CM

## 2018-04-05 DIAGNOSIS — M545 Low back pain: Secondary | ICD-10-CM | POA: Diagnosis not present

## 2018-04-05 NOTE — Therapy (Signed)
[place], [place], [address] Phone: [phone]   Fax:  [phone]  Physical Therapy Treatment  Patient Details  Name: [name] MRN: [id number] Date of Birth: 09-13-1940 Referring Provider (PT): Zollie Beckers MD   Encounter Date: 04/05/2018  PT End of Session - 04/05/18 1020    Visit Number  16    Number of Visits  17    Date for PT Re-Evaluation  04/07/18    Authorization Type  medicare progress note at 10th visit and Kx mod by 15th visit    PT Start Time  1015       Past Medical History:  Diagnosis Date  . Acute on chronic diastolic (congestive) heart failure ([place])   . Anemia    iron deficiency anemia - secondary to blood loss ( chronic)   . Arthritis    endstage changes bilateral knees/bilateral ankles.   . Asthma   . Carotid artery occlusion   . Chronic fatigue   . Chronic kidney disease   . Closed left hip fracture ([place])   . Clotting disorder ([place])    pt denies this  . Contusion of left knee    due to fall 1/14.  [name] COPD (chronic obstructive pulmonary disease) (H[facility])    pulmonary fibrosis  . Depression, reactive   . Diabetes mellitus    type II   . Diastolic dysfunction   . Difficulty in walking   . Family history of heart disease   . Generalized muscle weakness   . Gout   . High cholesterol   . History of falling   . Hypertension   . Hypothyroidism   . Interstitial lung disease ([place])   . 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    . Morbid obesity ([place])   . Morbid obesity ([place])   . Neuromuscular disorder ([place])    diabetic neuropathy   . Normal coronary arteries    cardiac catheterization performed  10/31/14  . RA (rheumatoid arthritis) ([place])    has been off methotreaxte since 10/13.  [name] Rheumatoid arthritis ([place])   . Shortness of breath   . Spinal stenosis of lumbar region    . Thyroid disease   . Unspecified lack of coordination   . URI (upper respiratory infection)     Past Surgical History:  Procedure Laterality Date  . ABDOMINAL HYSTERECTOMY    . BRAIN SURGERY     Gamma knife 10/13. Needs repeat spring  '14  . CARDIAC CATHETERIZATION N/A 10/31/2014   Procedure: Right/Left Heart Cath and Coronary Angiography;  Surgeon: [name], MD;  Location: [facility];  Service: Cardiovascular;  Laterality: N/A;  . ESOPHAGOGASTRODUODENOSCOPY (EGD) WITH PROPOFOL N/A 09/14/2014   Procedure: ESOPHAGOGASTRODUODENOSCOPY (EGD) WITH PROPOFOL;  Surgeon: [name], MD;  [facility];  Service: Endoscopy;  Laterality: N/A;  . INCISION AND DRAINAGE HIP Left 01/16/2017   Procedure: IRRIGATION AND DEBRIDEMENT LEFT HIP;  Surgeon: [name], MD;  [facility];  Service: Orthopedics;  Laterality: Left;  . INTRAMEDULLARY (IM) NAIL INTERTROCHANTERIC Left 11/29/2016   Procedure: INTRAMEDULLARY (IM) NAIL INTERTROCHANTRIC;  Surgeon: [name], MD;  Location: [facility];  Service: Orthopedics;  Laterality: Left;  . OVARY SURGERY    . SHOULDER SURGERY Left   . TONSILLECTOMY  age 77  . VIDEO BRONCHOSCOPY Bilateral 05/31/2013   Procedure: VIDEO BRONCHOSCOPY WITHOUT FLUORO;  Surgeon: Brand Males, MD;  Location: [facility];  Service: Cardiopulmonary;  Laterality: Bilateral;  . video bronscoscopy  2000   lung    There were no vitals filed for this visit.  Subjective Assessment - 04/05/18 1018    Subjective  Pt brought portable O2 tank which is heavy and hangs around her neck. She is talking to MD about getting a lighter one and will have a test in January.     Currently in Pain?  Yes    Pain Location  Hip    Pain Orientation  Right    Pain Radiating Towards  to right knee, lower leg     Aggravating Factors   hurts when laying down                        [facility] Adult PT Treatment/Exercise - 04/05/18 0001       Ambulation/Gait   Ambulation Distance (Feet)  185 Feet   2 min 20 sec    Assistive device  Rolling walker    Gait velocity  1.32      Knee/Hip Exercises: Stretches   Active Hamstring Stretch  3 reps;30 seconds    Other Knee/Hip Stretches  Gastroc stretch using edge of step in RW 2 x 30 sec each       Knee/Hip Exercises: Aerobic   Nustep  L5 x 15 minutes LE only    SP02 99% with 2L 02, HR 108 bpm     Knee/Hip Exercises: Standing   Other Standing Knee Exercises  hip flexion x 10 each ,2 sets     Other Standing Knee Exercises  hip abduction x 10 each, 2 sets , heel raises x 20      Knee/Hip Exercises: Seated   Sit to Sand  10 reps;without UE support               PT Short Term Goals - 03/10/18 1215      PT SHORT TERM GOAL #1   Title  pt to be I with initial HEP     Time  4    Period  Weeks    Status  Achieved      PT SHORT TERM GOAL #2   Title  pt to verbalize/ demo proper posture and gait mechanics to prevent and reduce hip and low back pain     Baseline  has practiced but continues require cues    Period  Weeks    Status  On-going      PT SHORT TERM GOAL #3   Title  pt to be able to stand / ambulate >/= 10 with LRAD with </= 6/10 pain to promote therapeutic progression     Baseline  able to walk 10 min but reports pain increases to 6-7/10    Time  4    Period  Weeks    Status  On-going        PT Long Term Goals - 03/10/18 1216      PT LONG TERM GOAL #1   Title  pt to increase bil Le strength to >/= 4/5 to promote hip/ knee stability and maximzie safety with prolonged standing/ walking activity    Time  8    Period  Weeks    Status  On-going    Target Date  04/07/18      PT LONG TERM GOAL #2   Title  pt to be able to ambulate >/= 30 min with  LRAD reporting </= 4/10 pain for functional endurance for community and in home ambulation    Time  8    Period  Weeks    Status  On-going    Target Date  04/07/18      PT LONG TERM GOAL #3   Title  pt  increase trunk mobility by >/= 10 degrees in flexion and sidebending to with </= 3/10 pain to assist with functional mobility required for ADL s    Time  8    Period  Weeks    Status  On-going    Target Date  04/07/18      PT LONG TERM GOAL #4   Title  incresae FOTO score to </= 60% limited to demo improvement in function    Baseline  25% limited on FOTO     Period  Weeks    Status  On-going    Target Date  04/07/18      PT LONG TERM GOAL #5   Title  pt to be I with all HEP given as of last visit to maintain and progress current level of function     Baseline  progress with HEP as able.     Time  8    Period  Weeks    Status  On-going    Target Date  04/07/18            Plan - 04/05/18 1020    Clinical Impression Statement  Max walk time 2 min 40 sec without supplemental 02. Today she ambulated for 2 min and 20 sec with 2 Liters supplemental 02 prior to fatigue requiring seated rest. Right LE pain varies each visit. Calf and hamstring stretches help reduced cramp and tightness in RLE.    PT Next Visit Plan  Add KX modifier, ERO vs DC     PT Home Exercise Plan  lower trunk rotation, gentle hamstring stretching (sitting and supine), posterior pelvic tilt. supine marching. standing marching using RW sit to stand at sinnk, sitting pelvic tilt,   YOGA walking ( no handout)    Consulted and Agree with Plan of Care  Patient       Patient will benefit from skilled therapeutic intervention in order to improve the following deficits and impairments:  Abnormal gait, Pain, Decreased activity tolerance, Decreased balance, Postural dysfunction, Improper body mechanics, Decreased strength  Visit Diagnosis: Chronic bilateral low back pain without sciatica  Pain in left leg  Other abnormalities of gait and mobility     Problem List Patient Active Problem List   Diagnosis Date Noted  . Chronic bilateral low back pain without sciatica 02/17/2018  . Pain in left hip 02/18/2017  .  Wound infection after surgery, initial encounter 01/16/2017  . Closed nondisplaced intertrochanteric fracture of left femur with delayed healing 01/14/2017  . Postoperative wound infection 01/14/2017  . Gout 11/29/2016  . Depression 11/29/2016  . Closed left hip fracture ([place]) 11/29/2016  . GERD (gastroesophageal reflux disease) 11/29/2016  . Closed intertrochanteric fracture of hip, left, initial encounter ([place]) 11/29/2016  . Physical deconditioning 07/31/2015  . Pulmonary fibrosis, postinflammatory ([place]) 07/31/2015  . Tendinitis of left rotator cuff 06/13/2015  . Acute on chronic diastolic heart failure ([place]) 04/21/2015  . Acute pulmonary edema (H[facility])   . AKI (acute kidney injury) ([place])   . Peripheral edema   . Acute on chronic respiratory failure with hypoxia ([place]) 04/17/2015  . CAP (community acquired pneumonia) 04/17/2015  . Acute kidney injury superimposed on chronic  kidney disease ([place]) 04/17/2015  . Hypotension 04/17/2015  . Chronic respiratory failure with hypoxia ([place]) 02/09/2015  . Dyspnea and respiratory abnormality 01/03/2015  . Normal coronary arteries 11/02/2014  . Morbid obesity ([place]) 10/30/2014  . Dysphagia 08/21/2014  . Sore throat 07/18/2014  . Chronic fatigue 06/21/2014  . DOE (dyspnea on exertion) 05/24/2014  . High risk medication use 04/17/2014  . Aphasia 02/12/2014  . 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 Lumbar facet arthropathy 01/18/2014  . Acute bronchitis 07/24/2013  . Bilateral edema of lower extremity 05/30/2013  . Chronic cough 05/24/2013  . ILD (interstitial lung disease) ([place]) 04/10/2013  . Greater trochanteric bursitis of left hip 08/13/2012  . Spinal stenosis of lumbar region with radiculopathy 07/12/2012  . Inability to walk 07/12/2012  . Acute lumbar back pain 07/12/2012  . Asymptomatic bacteriuria 07/12/2012  . 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 ([place]) 02/17/2012  . Benign neoplasm of  meninges ([place]) 02/12/2012  . Brain mass 01/28/2012  . Chest pain with moderate risk of acute coronary syndrome 01/28/2012  . Rheumatoid arthritis ([place]) 01/28/2012  . Primary osteoarthritis of right knee 01/28/2012  . Dyslipidemia   . Thyroid disease   . Essential hypertension     Dorene Ar, [place] 04/05/2018, 11:51 AM  [facility] [address] [place], [place], [address] Phone: [phone]   Fax:  [phone]  Name: [name] MRN: [id number] Date of Birth: 07/14/1940

## 2018-04-07 ENCOUNTER — Ambulatory Visit: Payer: Medicare Other | Admitting: Physical Therapy

## 2018-04-07 ENCOUNTER — Encounter: Payer: Self-pay | Admitting: Physical Therapy

## 2018-04-07 VITALS — HR 109

## 2018-04-07 DIAGNOSIS — G8929 Other chronic pain: Secondary | ICD-10-CM | POA: Diagnosis not present

## 2018-04-07 DIAGNOSIS — R2689 Other abnormalities of gait and mobility: Secondary | ICD-10-CM

## 2018-04-07 DIAGNOSIS — M79605 Pain in left leg: Secondary | ICD-10-CM

## 2018-04-07 DIAGNOSIS — M545 Low back pain: Principal | ICD-10-CM

## 2018-04-07 NOTE — Therapy (Signed)
[place], [place], [address] Phone: [phone]   Fax:  [phone]  Physical Therapy Treatment / Discharge Summary  Patient Details  Name: [name] MRN: [id number] Date of Birth: [birth year] Referring Provider (PT): Zollie Beckers MD   Encounter Date: 04/07/2018  PT End of Session - 04/07/18 1151    Visit Number  17    Number of Visits  17    Date for PT Re-Evaluation  04/07/18    Authorization Type  medicare progress note at 10th visit and Kx mod by 15th visit    PT Start Time  1151    PT Stop Time  1220    PT Time Calculation (min)  29 min    Activity Tolerance  Patient tolerated treatment well    Behavior During Therapy  [facility] for tasks assessed/performed       Past Medical History:  Diagnosis Date  . Acute on chronic diastolic (congestive) heart failure ([place])   . Anemia    iron deficiency anemia - secondary to blood loss ( chronic)   . Arthritis    endstage changes bilateral knees/bilateral ankles.   . Asthma   . Carotid artery occlusion   . Chronic fatigue   . Chronic kidney disease   . Closed left hip fracture ([place])   . Clotting disorder ([place])    pt denies this  . Contusion of left knee    due to fall 1/14.  [name] COPD (chronic obstructive pulmonary disease) (HCC)    pulmonary fibrosis  . Depression, reactive   . Diabetes mellitus    type II   . Diastolic dysfunction   . Difficulty in walking   . Family history of heart disease   . Generalized muscle weakness   . Gout   . High cholesterol   . History of falling   . Hypertension   . Hypothyroidism   . Interstitial lung disease ([place])   . 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    . Morbid obesity ([place])   . Morbid obesity ([place])   . Neuromuscular disorder ([place])    diabetic neuropathy   . Normal coronary arteries    cardiac  catheterization performed  10/31/14  . RA (rheumatoid arthritis) ([place])    has been off methotreaxte since 10/13.  [name] Rheumatoid arthritis ([place])   . Shortness of breath   . Spinal stenosis of lumbar region   . Thyroid disease   . Unspecified lack of coordination   . URI (upper respiratory infection)     Past Surgical History:  Procedure Laterality Date  . ABDOMINAL HYSTERECTOMY    . BRAIN SURGERY     Gamma knife 10/13. Needs repeat spring  '14  . CARDIAC CATHETERIZATION N/A 10/31/2014   Procedure: Right/Left Heart Cath and Coronary Angiography;  Surgeon: [name], MD;  Location: [facility];  Service: Cardiovascular;  Laterality: N/A;  . ESOPHAGOGASTRODUODENOSCOPY (EGD) WITH PROPOFOL N/A 09/14/2014   Procedure: ESOPHAGOGASTRODUODENOSCOPY (EGD) WITH PROPOFOL;  Surgeon: [name], MD;  Location: [facility];  Service: Endoscopy;  Laterality: N/A;  . INCISION AND DRAINAGE HIP Left 01/16/2017   Procedure: IRRIGATION AND DEBRIDEMENT LEFT HIP;  Surgeon: [name], MD;  Location: [facility];  Service: Orthopedics;  Laterality: Left;  . INTRAMEDULLARY (IM) NAIL INTERTROCHANTERIC Left 11/29/2016   Procedure: INTRAMEDULLARY (IM) NAIL INTERTROCHANTRIC;  Surgeon: [name],  MD;  Location: [facility];  Service: Orthopedics;  Laterality: Left;  . OVARY SURGERY    . SHOULDER SURGERY Left   . TONSILLECTOMY  age 89  . VIDEO BRONCHOSCOPY Bilateral 05/31/2013   Procedure: VIDEO BRONCHOSCOPY WITHOUT FLUORO;  Surgeon: Brand Males, MD;  Location: [facility];  Service: Cardiopulmonary;  Laterality: Bilateral;  . video bronscoscopy  2000   lung    Vitals:   04/07/18 1159  Pulse: (!) 109  SpO2: 97%        [facility] PT Assessment - 04/07/18 0001      Assessment   Referring Provider (PT)  Zollie Beckers MD      Observation/Other Assessments   Focus on Therapeutic Outcomes (FOTO)   53% limited      AROM   Lumbar Flexion  62    Lumbar Extension  30     Lumbar - Right Side Bend  12    Lumbar - Left Side Bend  12      Strength   Right Hip Flexion  4+/5    Right Hip ABduction  4-/5    Left Hip Flexion  4+/5    Left Hip ABduction  4/5    Right Knee Flexion  4/5    Right Knee Extension  4+/5    Left Knee Flexion  4/5    Left Knee Extension  4+/5                   [facility] Adult PT Treatment/Exercise - 04/07/18 0001      Knee/Hip Exercises: Standing   Gait Training  ambulated in the gym x 2:15 seconds reporting more fatigue related due to oxygen demand and not to fatigue with her legs or back      Knee/Hip Exercises: Seated   Sit to Sand  1 set;10 reps;without UE support             PT Education - 04/07/18 1237    Education Details  reviewed previously provided HEP and discussed importance of being consistent with her HEP. to try and call her PCP and see if she can get into respiratory therapy.     Person(s) Educated  Patient    Methods  Explanation;Verbal cues    Comprehension  Verbalized understanding       PT Short Term Goals - 04/07/18 1202      PT SHORT TERM GOAL #1   Period  Weeks    Status  Achieved      PT SHORT TERM GOAL #2   Title  pt to verbalize/ demo proper posture and gait mechanics to prevent and reduce hip and low back pain     Time  4    Period  Weeks    Status  Achieved      PT SHORT TERM GOAL #3   Title  pt to be able to stand / ambulate >/= 10 with LRAD with </= 6/10 pain to promote therapeutic progression     Baseline  pt amb 2:15 seconds before requesting Rest with RW reports no pain     Time  4    Period  Weeks    Status  Partially Met        PT Long Term Goals - 04/07/18 1208      PT LONG TERM GOAL #1   Title  pt to increase bil Le strength to >/= 4/5 to promote hip/ knee stability and maximzie safety with prolonged standing/ walking activity  Baseline  bil hip abduction/ extension 4-/5    Time  8    Period  Weeks    Status  Partially Met      PT LONG TERM GOAL #2    Title  pt to be able to ambulate >/= 30 min with LRAD reporting </= 4/10 pain for functional endurance for community and in home ambulation    Baseline  only able to [facility] 2:15 reporting no pain states she is more fatigued due to oxygen    Period  Weeks    Status  Not Met      PT LONG TERM GOAL #3   Title  pt increase trunk mobility by >/= 10 degrees in flexion and sidebending to with </= 3/10 pain to assist with functional mobility required for ADL s    Baseline  no pain     Status  Partially Met      PT LONG TERM GOAL #4   Title  incresae FOTO score to </= 60% limited to demo improvement in function    Baseline  53% limited    Time  8    Period  Weeks    Status  Partially Met      PT LONG TERM GOAL #5   Title  pt to be I with all HEP given as of last visit to maintain and progress current level of function     Time  8    Period  Weeks    Status  Achieved            Plan - 04/07/18 1230    Clinical Impression Statement  Mrs. Whitson has made good progress with physical therapy increasing low back mobility and bil LE strength. worked on ambulation/ endurnace which she continues to fatigue without supplemental O2 ~2:15 seconds with O2 sat at 92% and HR increasing to 119 BPM. pt reported no pain or back/ LE discomfort. Pt appears to be more limited with endurance due to O2 demand versus musculoskeletal limitations and would potentially benefit from attending respiratory therapy. pt me or partially met all goals except for LTG #2 and is able to maintain her current level of function and will be discharged from PT today.     PT Treatment/Interventions  ADLs/Self Care Home Management;Cryotherapy;Electrical Stimulation;Iontophoresis 13m/ml Dexamethasone;Moist Heat;Ultrasound;Balance training;Stair training;Functional mobility training;Therapeutic activities;Therapeutic exercise;Manual techniques;Taping;Dry needling;Gait training    PT Next Visit Plan  D/C    PT Home Exercise Plan   lower trunk rotation, gentle hamstring stretching (sitting and supine), posterior pelvic tilt. supine marching. standing marching using RW sit to stand at sinnk, sitting pelvic tilt,   YOGA walking ( no handout)    Consulted and Agree with Plan of Care  Patient       Patient will benefit from skilled therapeutic intervention in order to improve the following deficits and impairments:  Abnormal gait, Pain, Decreased activity tolerance, Decreased balance, Postural dysfunction, Improper body mechanics, Decreased strength  Visit Diagnosis: Chronic bilateral low back pain without sciatica  Pain in left leg  Other abnormalities of gait and mobility     Problem List Patient Active Problem List   Diagnosis Date Noted  . Chronic bilateral low back pain without sciatica 02/17/2018  . Pain in left hip 02/18/2017  . Wound infection after surgery, initial encounter 01/16/2017  . Closed nondisplaced intertrochanteric fracture of left femur with delayed healing 01/14/2017  . Postoperative wound infection 01/14/2017  . Gout 11/29/2016  . Depression 11/29/2016  . Closed  left hip fracture ([place]) 11/29/2016  . GERD (gastroesophageal reflux disease) 11/29/2016  . Closed intertrochanteric fracture of hip, left, initial encounter ([place]) 11/29/2016  . Physical deconditioning 07/31/2015  . Pulmonary fibrosis, postinflammatory ([place]) 07/31/2015  . Tendinitis of left rotator cuff 06/13/2015  . Acute on chronic diastolic heart failure ([place]) 04/21/2015  . Acute pulmonary edema (HCC)   . AKI (acute kidney injury) ([place])   . Peripheral edema   . Acute on chronic respiratory failure with hypoxia ([name]) 04/17/2015  . CAP (community acquired pneumonia) 04/17/2015  . Acute kidney injury superimposed on chronic kidney disease ([place]) 04/17/2015  . Hypotension 04/17/2015  . Chronic respiratory failure with hypoxia ([place]) 02/09/2015  . Dyspnea and respiratory abnormality 01/03/2015  . Normal coronary arteries  11/02/2014  . Morbid obesity ([place]) 10/30/2014  . Dysphagia 08/21/2014  . Sore throat 07/18/2014  . Chronic fatigue 06/21/2014  . DOE (dyspnea on exertion) 05/24/2014  . High risk medication use 04/17/2014  . Aphasia 02/12/2014  . 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 Lumbar facet arthropathy 01/18/2014  . Acute bronchitis 07/24/2013  . Bilateral edema of lower extremity 05/30/2013  . Chronic cough 05/24/2013  . ILD (interstitial lung disease) ([place]) 04/10/2013  . Greater trochanteric bursitis of left hip 08/13/2012  . Spinal stenosis of lumbar region with radiculopathy 07/12/2012  . Inability to walk 07/12/2012  . Acute lumbar back pain 07/12/2012  . Asymptomatic bacteriuria 07/12/2012  . 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 ([place]) 02/17/2012  . Benign neoplasm of meninges ([place]) 02/12/2012  . Brain mass 01/28/2012  . Chest pain with moderate risk of acute coronary syndrome 01/28/2012  . Rheumatoid arthritis ([place]) 01/28/2012  . Primary osteoarthritis of right knee 01/28/2012  . Dyslipidemia   . Thyroid disease   . Essential hypertension     [place] 04/07/2018, 12:39 PM  [facility] [address] [place], [place], [address] Phone: [phone]   Fax:  [phone]  Name: VERNETA HAMIDI MRN: [id number] Date of Birth: 03-08-1941      PHYSICAL THERAPY DISCHARGE SUMMARY  Visits from Start of Care: 17  Current functional level related to goals / functional outcomes: See goals, FOTO 53% limited   Remaining deficits: Limited walking endurance related more to oxygen demand rather than musculoskeletal soreness/ pain. See assessment in note.    Education / Equipment: HEP, theraband, posture  Plan: Patient agrees to discharge.  Patient goals were partially met. Patient is being discharged due to being pleased with the current functional level.  ?????           Gianni Mihalik PT, DPT, LAT, ATC  04/07/18  12:42 PM

## 2018-04-13 ENCOUNTER — Other Ambulatory Visit: Payer: Medicare Other | Admitting: Licensed Clinical Social Worker

## 2018-04-13 ENCOUNTER — Other Ambulatory Visit: Payer: Medicare Other | Admitting: *Deleted

## 2018-04-13 ENCOUNTER — Encounter: Payer: Self-pay | Admitting: *Deleted

## 2018-04-13 DIAGNOSIS — Z515 Encounter for palliative care: Secondary | ICD-10-CM

## 2018-04-13 NOTE — Progress Notes (Signed)
COMMUNITY PALLIATIVE CARE RN NOTE  PATIENT NAME: [name] DOB: 10-16-1940 MRN: [id number]  PRIMARY CARE PROVIDER: Glendale Chard, MD  RESPONSIBLE PARTY:  Acct ID - Guarantor Home Phone Work Phone Relationship Acct Type  [id number] KAYSEY, [id number] [phone] Self P/F     [address], [name], [place] [address]    PLAN OF CARE and INTERVENTION:  1. ADVANCE CARE PLANNING/GOALS OF CARE: Remain in her home with her husband and avoid hospitalizations 2. PATIENT/CAREGIVER EDUCATION: Reinforced Safe Mobility/Transfers and Pain Management 3. DISEASE STATUS: Joint visit made with Palliative Care SW, Lynn Duffy. Met with patient in her home. She is awake and alert. Pleasant mood. Reports mild pain in her R leg, radiating from her hip down. Currently a 2/10. Pain increases to about 5/10 with movement. Current pain regimen is effective. She also supplements her pain regimen with PRN Tylenol. She reports she has been decreasing the amount of pain medication she takes d/t constipation. Miralax causes her bloating and pain so she is now taking dulcolax tabs about 2x/week. She is no longer taking her Morphine ER at bedtime, only in the am. She recently completed Outpatient PT and has progressed. She is ambulating better with her walker for about 3 minutes. Also she feels that her R leg is not as still and no longer getting cramps as she used to in the past. She is now having to use her Oxygen at 2L/min via [place] with exertion due to her sats dropping to the low 80s on room air. No dyspnea noted during conversation today. It was recommended that she go to Pulmonary and Cardiac Rehab. She continues with 3+ edema to bilateral lower extremities. She has not taken her Lasix in several days. She states after taking Lasix and after urination, she will begin to have pain in her R lower back region. She is going to address with her Primary MD. Will continue to monitor.   HISTORY OF PRESENT ILLNESS:   This is a 77 yo female who resides at home with her husband. Her son also is now living with her to help with household chores. Palliative Care Team continues to follow patient. Next visit scheduled in 3 weeks.  CODE STATUS: DNR ADVANCED DIRECTIVES: N MOST FORM: no PPS: 50%   PHYSICAL EXAM:   VITALS: Today's Vitals   04/13/18 1100  BP: (!) 156/89  Pulse: 99  Resp: 18  Temp: 97.6 F (36.4 C)  TempSrc: Temporal  SpO2: 97%  PainSc: 2   PainLoc: Leg    LUNGS: clear to auscultation  CARDIAC: Cor RRR EXTREMITIES: 3+ and non-pitting edema SKIN: No skin issues at this time  NEURO: Alert and oriented x 3, forgetful, pleasant mood, ambulatory with walker   (Duration of visit and documentation 75 minutes)    Daryl Eastern, RN

## 2018-04-14 NOTE — Progress Notes (Signed)
COMMUNITY PALLIATIVE CARE SW NOTE  PATIENT NAME: [name] DOB: 1940/06/28 MRN: [id number]  PRIMARY CARE PROVIDER: Glendale Chard, MD  RESPONSIBLE PARTY:  Acct ID - Guarantor Home Phone Work Phone Relationship Acct Type  [id number] [name], [name] Self P/F     [address], [name], [name]     PLAN OF CARE and INTERVENTIONS:             1. GOALS OF CARE/ ADVANCE CARE PLANNING:  Patient's goal is to remain at home with her husband.  She has a DNR. 2. SOCIAL/EMOTIONAL/SPIRITUAL ASSESSMENT/ INTERVENTIONS:  SW and Palliative Care RN, Daryl Eastern, met with patient in her home.  She denied pain.  Patient was alert and oriented, but reports feeling stiff in the mornings.  SW provided active listening while she discussed her family and physical status.  She has been discharged from physical therapy.  Her son continues assisting with ADLs.  Patient to follow up with her MD regarding concerns about her medication regimen. 3. PATIENT/CAREGIVER EDUCATION/ COPING:  Patient copes by expressing her feelings openly to [name], but states she holds her emotions in with her husband and children. 4. PERSONAL EMERGENCY PLAN:  Patient keeps a cell phone with her.  She informs her husband or children if there is a medical concern. 5. COMMUNITY RESOURCES COORDINATION/ HEALTH CARE NAVIGATION:  None. 6. FINANCIAL/LEGAL CONCERNS/INTERVENTIONS:  None per patient.     SOCIAL HX:  Social History   Tobacco Use  . Smoking status: Never Smoker  . Smokeless tobacco: Never Used  Substance Use Topics  . Alcohol use: No    CODE STATUS:  DNR  ADVANCED DIRECTIVES: N MOST FORM COMPLETE:  N HOSPICE EDUCATION PROVIDED:  N PPS:  Patient reports her appetite is normal.  She can stand independently and uses her walker. Duration of visit and documentation:  90 minutes.      Creola Corn Waynesha Rammel, LCSW

## 2018-04-19 ENCOUNTER — Other Ambulatory Visit: Payer: Self-pay | Admitting: Nurse Practitioner

## 2018-04-21 ENCOUNTER — Ambulatory Visit: Payer: Medicare Other

## 2018-04-27 ENCOUNTER — Other Ambulatory Visit: Payer: Medicare Other | Admitting: *Deleted

## 2018-04-27 DIAGNOSIS — Z515 Encounter for palliative care: Secondary | ICD-10-CM

## 2018-04-28 ENCOUNTER — Other Ambulatory Visit: Payer: Self-pay

## 2018-04-28 DIAGNOSIS — E079 Disorder of thyroid, unspecified: Secondary | ICD-10-CM

## 2018-04-28 MED ORDER — LEVOTHYROXINE SODIUM 50 MCG PO TABS: 50.0000 ug | ORAL_TABLET | Freq: Every day | ORAL | 1 refills | Status: DC

## 2018-04-28 NOTE — Progress Notes (Signed)
COMMUNITY PALLIATIVE CARE RN NOTE  PATIENT NAME: [name] DOB: 21-Nov-1940 MRN: [id number]  PRIMARY CARE PROVIDER: Glendale Chard, MD  RESPONSIBLE PARTY:  Acct ID - Guarantor Home Phone Work Phone Relationship Acct Type  [id number] [name], [name] Self P/F     [address], [name], [place] [address]    PLAN OF CARE and INTERVENTION:  1. ADVANCE CARE PLANNING/GOALS OF CARE: Remain in her home and avoid going to the hospital 2. PATIENT/CAREGIVER EDUCATION: Reinforced Safe Mobility/Transfers and Energy Conservation 3. DISEASE STATUS: Met with patient in her home. She is initially sitting up in a chair in her bedroom. She just woke up from a nap. Observed patient ambulate into the kitchen using her walker. Gait is steady, however dyspnea noted with exertion and at times during conversation. She is able to recover within 30 seconds at rest. Continues with O2 continuously during the night and PRN during the day. She reports that she cooked breakfast this am for the first time in a long while. She did well, but this activity made her sleepy and tired. She states that she is feeling better since she took a nap. She has started back taking her Lasix regularly as prescribed. She is no longer c/o of pain in her side after taking this medication. She notices significant edema in both legs when she does not take regularly, as well as heaviness in her chest. She is taking her pain medication again as prescribed, MS ER BID and IR for breakthrough as needed. She has also started taking her muscle relaxant at bedtime more regularly, which has improved her pain control. She is still able to travel outside of her home on occasion to run small errands. She is independent with all ADLs, but does need assistance with household chores. No recent falls. Will continue to monitor.   HISTORY OF PRESENT ILLNESS:  This is a 77 yo female who resides at home with her husband. Her son also lives  with her to provide additional assistance. Palliative Care Team continues to follow patient. Next visit scheduled in 1 month.  CODE STATUS: DNR ADVANCED DIRECTIVES: N MOST FORM: no PPS: 50%   PHYSICAL EXAM:   VITALS: Today's Vitals   04/27/18 1330  BP: 123/77  Pulse: 96  Resp: 20  Temp: 98.1 F (36.7 C)  TempSrc: Temporal  SpO2: 96%  PainSc: 2   PainLoc: Hip    LUNGS: clear to auscultation  CARDIAC: Cor RRR EXTREMITIES: 2+ edema to bilateral lower extremities, TED hose on SKIN: Exposed skin is dry and intact  NEURO: Alert and oriented x 3, pleasant mood, generalized weakness, ambulatory with walker   (Duration of visit and documentation 75 minutes)    Daryl Eastern, RN, BSN

## 2018-05-04 ENCOUNTER — Encounter: Payer: Self-pay | Admitting: Registered Nurse

## 2018-05-04 ENCOUNTER — Encounter: Payer: Medicare Other | Attending: Physical Medicine & Rehabilitation | Admitting: Registered Nurse

## 2018-05-04 VITALS — BP 108/71 | HR 94 | Ht 64.0 in | Wt 270.0 lb

## 2018-05-04 DIAGNOSIS — Z5181 Encounter for therapeutic drug level monitoring: Secondary | ICD-10-CM | POA: Diagnosis not present

## 2018-05-04 DIAGNOSIS — G894 Chronic pain syndrome: Secondary | ICD-10-CM | POA: Diagnosis not present

## 2018-05-04 DIAGNOSIS — E1122 Type 2 diabetes mellitus with diabetic chronic kidney disease: Secondary | ICD-10-CM | POA: Insufficient documentation

## 2018-05-04 DIAGNOSIS — F329 Major depressive disorder, single episode, unspecified: Secondary | ICD-10-CM | POA: Insufficient documentation

## 2018-05-04 DIAGNOSIS — M47816 Spondylosis without myelopathy or radiculopathy, lumbar region: Secondary | ICD-10-CM

## 2018-05-04 DIAGNOSIS — M2550 Pain in unspecified joint: Secondary | ICD-10-CM

## 2018-05-04 DIAGNOSIS — Z9889 Other specified postprocedural states: Secondary | ICD-10-CM | POA: Insufficient documentation

## 2018-05-04 DIAGNOSIS — M1711 Unilateral primary osteoarthritis, right knee: Secondary | ICD-10-CM | POA: Diagnosis not present

## 2018-05-04 DIAGNOSIS — E1142 Type 2 diabetes mellitus with diabetic polyneuropathy: Secondary | ICD-10-CM | POA: Diagnosis not present

## 2018-05-04 DIAGNOSIS — M1712 Unilateral primary osteoarthritis, left knee: Secondary | ICD-10-CM

## 2018-05-04 DIAGNOSIS — Z79891 Long term (current) use of opiate analgesic: Secondary | ICD-10-CM | POA: Diagnosis not present

## 2018-05-04 DIAGNOSIS — M62838 Other muscle spasm: Secondary | ICD-10-CM | POA: Diagnosis not present

## 2018-05-04 DIAGNOSIS — Z90710 Acquired absence of both cervix and uterus: Secondary | ICD-10-CM | POA: Diagnosis not present

## 2018-05-04 DIAGNOSIS — M069 Rheumatoid arthritis, unspecified: Secondary | ICD-10-CM | POA: Diagnosis not present

## 2018-05-04 DIAGNOSIS — E1140 Type 2 diabetes mellitus with diabetic neuropathy, unspecified: Secondary | ICD-10-CM | POA: Diagnosis not present

## 2018-05-04 DIAGNOSIS — M48062 Spinal stenosis, lumbar region with neurogenic claudication: Secondary | ICD-10-CM | POA: Diagnosis not present

## 2018-05-04 DIAGNOSIS — M5416 Radiculopathy, lumbar region: Secondary | ICD-10-CM | POA: Diagnosis not present

## 2018-05-04 MED ORDER — MORPHINE SULFATE 15 MG PO TABS: 15.0000 mg | ORAL_TABLET | Freq: Two times a day (BID) | ORAL | 0 refills | Status: DC | PRN

## 2018-05-04 MED ORDER — MORPHINE SULFATE ER 15 MG PO TBCR: 15.0000 mg | EXTENDED_RELEASE_TABLET | Freq: Two times a day (BID) | ORAL | 0 refills | Status: DC

## 2018-05-04 NOTE — Progress Notes (Signed)
Subjective:    Patient ID: [name], female    DOB: 06/02/1940, 77 y.o.   MRN: [id number]  HPI: [name] is a 77 y.o. female who returns for follow up appointment for chronic pain and medication refill. She states her pain is located in her mid- lower back radiating into her bilateral lower extremities. Also reports generalized pain all over and generalized joint pain. She rates her pain 3. Her  current exercise regime is walking and performing stretching exercises. [name] reports she completed her physical therapy.   [name] also reports she has begun trying to wean her MS Contin to a daily dose, she express interest wanting to decrease her medication. States since her therapy she has become more active and pain is being manage. We will continue to wean her analgesics slowly, she was instructed to call office with any questions or changes in her pain, she verbalizes understanding. Her MS Contin and MSIR was last filled on 03/11/2018.    [name] Morphine equivalent is 60.00 MME.  Last UDS was Performed on 12/09/2017, it was consistent. UDS performed today.   Pain Inventory Average Pain 6 Pain Right Now 3 My pain is intermittent, dull and aching  In the last 24 hours, has pain interfered with the following? General activity 3 Relation with others 3 Enjoyment of life 3 What TIME of day is your pain at its worst? morning Sleep (in general) Fair  Pain is worse with: walking, bending, standing and some activites Pain improves with: medication Relief from Meds: 9  Mobility use a walker ability to climb steps?  no do you drive?  no use a wheelchair  Function retired I need assistance with the following:  dressing, meal prep, household duties and shopping  Neuro/Psych bladder control problems weakness numbness tingling trouble walking  Prior Studies Any changes since last visit?  no  Physicians involved in your care Any changes since last  visit?  no   Family History  Problem Relation Age of Onset  . Diabetes Mother   . Heart attack Mother   . Hypertension Father   . Lung cancer Father   . Diabetes Sister   . Diabetes Brother   . Hypertension Brother   . Heart disease Brother   . Heart attack Brother   . Kidney cancer Brother   . Uterine cancer Daughter   . Breast cancer Sister   . Rheum arthritis Maternal Uncle   . Gout Brother   . Kidney failure Brother        x 5   Social History   Socioeconomic History  . Marital status: Married    Spouse name: Not on file  . Number of children: 6  . Years of education: college  . Highest education level: Not on file  Occupational History  . Occupation: retired Ship broker  . Financial resource strain: Not on file  . Food insecurity:    Worry: Not on file    Inability: Not on file  . Transportation needs:    Medical: Not on file    Non-medical: Not on file  Tobacco Use  . Smoking status: Never Smoker  . Smokeless tobacco: Never Used  Substance and Sexual Activity  . Alcohol use: No  . Drug use: No  . Sexual activity: Not Currently  Lifestyle  . Physical activity:    Days per week: Not on file    Minutes per session: Not on file  .  Stress: Not on file  Relationships  . Social connections:    Talks on phone: Not on file    Gets together: Not on file    Attends religious service: Not on file    Active member of club or organization: Not on file    Attends meetings of clubs or organizations: Not on file    Relationship status: Not on file  Other Topics Concern  . Not on file  Social History Narrative   Patient consumes 2-3 cups coffee per day.   Past Surgical History:  Procedure Laterality Date  . ABDOMINAL HYSTERECTOMY    . BRAIN SURGERY     Gamma knife 10/13. Needs repeat spring  '14  . CARDIAC CATHETERIZATION N/A 10/31/2014   Procedure: Right/Left Heart Cath and Coronary Angiography;  Surgeon: [name], MD;  Location: [facility];  Service: Cardiovascular;  Laterality: N/A;  . ESOPHAGOGASTRODUODENOSCOPY (EGD) WITH PROPOFOL N/A 09/14/2014   Procedure: ESOPHAGOGASTRODUODENOSCOPY (EGD) WITH PROPOFOL;  Surgeon: [name], MD;  Location: [facility];  Service: Endoscopy;  Laterality: N/A;  . INCISION AND DRAINAGE HIP Left 01/16/2017   Procedure: IRRIGATION AND DEBRIDEMENT LEFT HIP;  Surgeon: [name], MD;  Location: [facility];  Service: Orthopedics;  Laterality: Left;  . INTRAMEDULLARY (IM) NAIL INTERTROCHANTERIC Left 11/29/2016   Procedure: INTRAMEDULLARY (IM) NAIL INTERTROCHANTRIC;  Surgeon: [name], MD;  Location: [facility];  Service: Orthopedics;  Laterality: Left;  . OVARY SURGERY    . SHOULDER SURGERY Left   . TONSILLECTOMY  age 49  . VIDEO BRONCHOSCOPY Bilateral 05/31/2013   Procedure: VIDEO BRONCHOSCOPY WITHOUT FLUORO;  Surgeon: Brand Males, MD;  Location: [facility];  Service: Cardiopulmonary;  Laterality: Bilateral;  . video bronscoscopy  2000   lung   Past Medical History:  Diagnosis Date  . Acute on chronic diastolic (congestive) heart failure ([place])   . Anemia    iron deficiency anemia - secondary to blood loss ( chronic)   . Arthritis    endstage changes bilateral knees/bilateral ankles.   . Asthma   . Carotid artery occlusion   . Chronic fatigue   . Chronic kidney disease   . Closed left hip fracture ([place])   . Clotting disorder ([place])    pt denies this  . Contusion of left knee    due to fall 1/14.  [name] COPD (chronic obstructive pulmonary disease) (HCC)    pulmonary fibrosis  . Depression, reactive   . Diabetes mellitus    type II   . Diastolic dysfunction   . Difficulty in walking   . Family history of heart disease   . Generalized muscle weakness   . Gout   . High cholesterol   . History of falling   . Hypertension   . Hypothyroidism   . Interstitial lung disease ([place])   . 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 (HCC)  02/17/2012   Continue diplopia, left eye pain and left headaches.    . Morbid obesity ([place])   . Morbid obesity ([place])   . Neuromuscular disorder ([place])    diabetic neuropathy   . Normal coronary arteries    cardiac catheterization performed  10/31/14  . RA (rheumatoid arthritis) ([place])    has been off methotreaxte since 10/13.  [name] Rheumatoid arthritis ([place])   . Shortness of breath   . Spinal stenosis of lumbar region   . Thyroid disease   . Unspecified lack of coordination   . URI (upper  respiratory infection)    BP 108/71   Pulse 94   Ht _0  (1.626 m)   Wt 270 lb (122.5 kg)   SpO2 95% Comment: 2ltrs oxygen via nasal can  BMI 46.35 kg/m   Opioid Risk Score:   Fall Risk Score:  `1  Depression screen PHQ 2/9  Depression screen [facility] 2/9 12/09/2017 10/22/2017 09/22/2017 07/28/2017 06/30/2017 06/03/2017 05/07/2017  Decreased Interest 1 0 0 0 0 0 0  Down, Depressed, Hopeless 1 0 0 0 0 0 0  PHQ - 2 Score 2 0 0 0 0 0 0  Altered sleeping - - - - - - -  Tired, decreased energy - - - - - - -  Change in appetite - - - - - - -  Feeling bad or failure about yourself  - - - - - - -  Trouble concentrating - - - - - - -  Moving slowly or fidgety/restless - - - - - - -  Suicidal thoughts - - - - - - -  PHQ-9 Score - - - - - - -  Difficult doing work/chores - - - - - - -  Some recent data might be hidden     Review of Systems  Constitutional: Negative.   HENT: Negative.   Eyes: Negative.   Respiratory: Negative.   Cardiovascular: Negative.   Gastrointestinal: Negative.   Endocrine: Negative.   Genitourinary: Positive for difficulty urinating.  Musculoskeletal: Positive for arthralgias, gait problem and myalgias.  Skin: Negative.   Allergic/Immunologic: Negative.   Neurological: Positive for weakness and numbness.  Hematological: Negative.   Psychiatric/Behavioral: Negative.   All other systems reviewed and are negative.      Objective:   Physical Exam Vitals signs and nursing note  reviewed.  Constitutional:      Appearance: Normal appearance.  Neck:     Musculoskeletal: Normal range of motion and neck supple.     Comments: Cervical Paraspinal Tenderness: C-5-C-6 Cardiovascular:     Rate and Rhythm: Normal rate and regular rhythm.     Pulses: Normal pulses.     Heart sounds: Normal heart sounds.  Pulmonary:     Effort: Pulmonary effort is normal.     Breath sounds: Normal breath sounds.     Comments: Continuous Oxygen @ 2 liters nasal cannula Musculoskeletal:     Comments: Normal Muscle Bulk and Muscle Testing Reveals:  Upper Extremities: Full ROM and Muscle Strength 5/5 Bilateral AC Joint Tenderness  Thoracic and Lumbar Hypersensitivity Lower Extremities: Full ROM and Muscle Strength 5/5 Arrived in wheelchair   Skin:    General: Skin is warm and dry.  Neurological:     Mental Status: She is alert and oriented to person, place, and time.  Psychiatric:        Mood and Affect: Mood normal.        Behavior: Behavior normal.           Assessment & Plan:  1. Lumbar spinal stenosis with neurogenic claudication. Associated facet arthropathy/ Lumbar Radiculitis: Continuecurrent medication regime withGabapentinand HEP as tolerated. 05/05/2018 Refilledt: Continue with slow weaning: MS Contin 15 mg one tablet every 12 hours #50and continueMSIR 15 one tablet twice a dayas needed ( 6 am and 1:00 pm)#50..  We will continue the opioid monitoring program, this consists of regular clinic visits, examinations, urine drug screen, pill counts as well as use of [place] Controlled Substance reporting System. 2. Depression: Continuecurrent medication Regime:Cymbalta. 05/04/2018 3. Diabetes mellitus type  2 with polyneuropathy: Continuecurrent medication regime:Gabapentin. 05/04/2018 4. Rheumatoid arthritis and osteoarthritis. Continuecurrent medication Regime.Voltaren Gel. Rheumatology Following. 05/04/2018 5. Interstitial lung disease: Pulmonology  Following.05/04/2018. 6. Bilateral Osteoarthritis Knee's:  Continuecurrent medication regimen.Voltaren Gel. 05/04/2018 7. Muscle Spasm: Continuecurrent medication regime: Flexeril. 05/04/2018 8.BilateralGreater Trochanteric Tenderness: R>L. No complaints today. Continuecurrent treatmentwith Ice/Heat Therapy. 05/04/2018. 9. Polyarthralgia: Rheumatology Following: Continue to monitor.05/04/2018 10. Morbid Obesitity: Continue Healthy Diet Regime and HEP. Continue to monitor.05/04/2018   20 minutes of face to face patient care time was spent during this visit. All questions were encouraged and answered.   F/U in 1 month

## 2018-05-10 ENCOUNTER — Telehealth: Payer: Self-pay | Admitting: *Deleted

## 2018-05-10 LAB — TOXASSURE SELECT,+ANTIDEPR,UR

## 2018-05-10 LAB — 6-ACETYLMORPHINE,TOXASSURE ADD
6-ACETYLMORPHINE: NEGATIVE
6-acetylmorphine: NOT DETECTED ng/mg creat

## 2018-05-10 NOTE — Telephone Encounter (Signed)
Urine drug screen for this encounter is consistent for prescribed medication 

## 2018-05-20 ENCOUNTER — Telehealth: Payer: Self-pay | Admitting: Internal Medicine

## 2018-05-20 NOTE — Telephone Encounter (Signed)
Tried to call patient at Home #. To schedule AWV-S  now eligible. No Answer.  Left message to call Lattie Haw at [phone]. lec

## 2018-05-21 NOTE — Telephone Encounter (Signed)
Tried to call patient to schedule AWV-S  Last AWV 04/16/17.  No answer.  Couldn't leave message because mailbox is full. lec

## 2018-05-24 NOTE — Telephone Encounter (Signed)
Spoke with patient and scheduled her AWV-S on 01/22/202 at 3:45 pm. lec

## 2018-05-26 ENCOUNTER — Ambulatory Visit (INDEPENDENT_AMBULATORY_CARE_PROVIDER_SITE_OTHER): Payer: Medicare Other | Admitting: Nurse Practitioner

## 2018-05-26 ENCOUNTER — Other Ambulatory Visit: Payer: Self-pay

## 2018-05-26 ENCOUNTER — Encounter: Payer: Self-pay | Admitting: Nurse Practitioner

## 2018-05-26 VITALS — BP 134/80 | HR 95 | Temp 98.3°F | Ht 61.8 in | Wt 273.4 lb

## 2018-05-26 DIAGNOSIS — E119 Type 2 diabetes mellitus without complications: Secondary | ICD-10-CM | POA: Diagnosis not present

## 2018-05-26 DIAGNOSIS — E039 Hypothyroidism, unspecified: Secondary | ICD-10-CM

## 2018-05-26 DIAGNOSIS — I10 Essential (primary) hypertension: Secondary | ICD-10-CM

## 2018-05-26 DIAGNOSIS — M05742 Rheumatoid arthritis with rheumatoid factor of left hand without organ or systems involvement: Secondary | ICD-10-CM

## 2018-05-26 DIAGNOSIS — M05741 Rheumatoid arthritis with rheumatoid factor of right hand without organ or systems involvement: Secondary | ICD-10-CM

## 2018-05-26 DIAGNOSIS — J849 Interstitial pulmonary disease, unspecified: Secondary | ICD-10-CM

## 2018-05-26 DIAGNOSIS — M170 Bilateral primary osteoarthritis of knee: Secondary | ICD-10-CM

## 2018-05-26 DIAGNOSIS — E785 Hyperlipidemia, unspecified: Secondary | ICD-10-CM | POA: Diagnosis not present

## 2018-05-26 DIAGNOSIS — Z7982 Long term (current) use of aspirin: Secondary | ICD-10-CM

## 2018-05-26 DIAGNOSIS — Z6841 Body Mass Index (BMI) 40.0 and over, adult: Secondary | ICD-10-CM

## 2018-05-26 NOTE — Progress Notes (Signed)
Subjective:     Patient ID: [name] , female    DOB: [birth year] , 78 y.o.   MRN: [id number]   Chief Complaint  Patient presents with  . Diabetes    HPI  Diabetes  She presents for her follow-up diabetic visit. She has type 2 diabetes mellitus. Her disease course has been stable. Pertinent negatives for hypoglycemia include no confusion, dizziness or nervousness/anxiousness. There are no diabetic associated symptoms. Pertinent negatives for diabetes include no blurred vision, no chest pain, no fatigue, no polydipsia, no polyphagia and no polyuria. There are no hypoglycemic complications. There are no known risk factors for coronary artery disease. When asked about current treatments, none were reported. There is no change in her home blood glucose trend. Her overall blood glucose range is 90-110 mg/dl.     Past Medical History:  Diagnosis Date  . Acute on chronic diastolic (congestive) heart failure ([place])   . Anemia    iron deficiency anemia - secondary to blood loss ( chronic)   . Arthritis    endstage changes bilateral knees/bilateral ankles.   . Asthma   . Carotid artery occlusion   . Chronic fatigue   . Chronic kidney disease   . Closed left hip fracture ([place])   . Clotting disorder ([place])    pt denies this  . Contusion of left knee    due to fall 1/14.  [name] COPD (chronic obstructive pulmonary disease) (HCC)    pulmonary fibrosis  . Depression, reactive   . Diabetes mellitus    type II   . Diastolic dysfunction   . Difficulty in walking   . Family history of heart disease   . Generalized muscle weakness   . Gout   . High cholesterol   . History of falling   . Hypertension   . Hypothyroidism   . Interstitial lung disease ([place])   . 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    . Morbid obesity ([place])   . Morbid obesity ([place])   . Neuromuscular disorder ([place])    diabetic  neuropathy   . Normal coronary arteries    cardiac catheterization performed  10/31/14  . RA (rheumatoid arthritis) ([place])    has been off methotreaxte since 10/13.  [name] Rheumatoid arthritis ([place])   . Shortness of breath   . Spinal stenosis of lumbar region   . Thyroid disease   . Unspecified lack of coordination   . URI (upper respiratory infection)      Family History  Problem Relation Age of Onset  . Diabetes Mother   . Heart attack Mother   . Hypertension Father   . Lung cancer Father   . Diabetes Sister   . Diabetes Brother   . Hypertension Brother   . Heart disease Brother   . Heart attack Brother   . Kidney cancer Brother   . Uterine cancer Daughter   . Breast cancer Sister   . Rheum arthritis Maternal Uncle   . Gout Brother   . Kidney failure Brother        x 5     Current Outpatient Medications:  .  acetaminophen (TYLENOL) 500 MG tablet, Take 1,000 mg by mouth every 6 (six) hours as needed for moderate pain. , Disp: , Rfl:  .  albuterol (PROVENTIL HFA;VENTOLIN HFA) 108 (90 BASE) MCG/ACT inhaler, Inhale 2 puffs into the lungs every 6 (six) hours as  needed for wheezing or shortness of breath., Disp: , Rfl:  .  allopurinol (ZYLOPRIM) 300 MG tablet, Take 300 mg by mouth daily., Disp: , Rfl:  .  aspirin 325 MG tablet, Take 1 tablet (325 mg total) by mouth daily., Disp: 60 tablet, Rfl: 0 .  atorvastatin (LIPITOR) 20 MG tablet, Take 10 mg by mouth every evening. , Disp: , Rfl:  .  azithromycin (ZITHROMAX) 500 MG tablet, Take 1 tablet by mouth daily. TAKE ON MON, WED & FRI, Disp: , Rfl: 0 .  Calcium Carb-Cholecalciferol (CALCIUM + D3 PO), Take 2 tablets by mouth daily with lunch. , Disp: , Rfl:  .  cholecalciferol (VITAMIN D) 1000 UNITS tablet, Take 2,000 Units by mouth daily with lunch. , Disp: , Rfl:  .  colchicine 0.6 MG tablet, Take 0.6 mg by mouth daily., Disp: , Rfl:  .  cyclobenzaprine (FLEXERIL) 5 MG tablet, Take 1 tablet (5 mg total) by mouth at bedtime as needed for  muscle spasms., Disp: 90 tablet, Rfl: 3 .  diclofenac sodium (VOLTAREN) 1 % GEL, Apply 1 application topically 3 (three) times daily. Both hands and knees, Disp: 3 Tube, Rfl: 4 .  docusate sodium (COLACE) 100 MG capsule, Take 1 capsule (100 mg total) by mouth 2 (two) times daily., Disp: 10 capsule, Rfl: 0 .  DULoxetine (CYMBALTA) 60 MG capsule, Take 1 capsule (60 mg total) by mouth daily., Disp: 90 capsule, Rfl: 3 .  fluticasone (FLONASE) 50 MCG/ACT nasal spray, Place 2 sprays into both nostrils daily., Disp: 16 g, Rfl: 3 .  furosemide (LASIX) 80 MG tablet, Take 80 mg by mouth daily. , Disp: , Rfl: 1 .  gabapentin (NEURONTIN) 600 MG tablet, Take two in the morning , Two mid- afternoon and two at bedtime, Disp: 540 tablet, Rfl: 3 .  guaiFENesin (MUCINEX) 600 MG 12 hr tablet, Take 1,200 mg by mouth 2 (two) times daily., Disp: , Rfl:  .  hydroxychloroquine (PLAQUENIL) 200 MG tablet, Take 200 mg by mouth daily., Disp: , Rfl:  .  ipratropium-albuterol (DUONEB) 0.5-2.5 (3) MG/3ML SOLN, Take 3 mLs by nebulization. Every 6 hours as needed for cough or wheezing, Disp: , Rfl:  .  KAPSPARGO SPRINKLE 50 MG CS24, TAKE 1 CAPSULE BY MOUTH EVERY DAY, Disp: 90 capsule, Rfl: 0 .  levothyroxine (SYNTHROID, LEVOTHROID) 50 MCG tablet, Take 1 tablet (50 mcg total) by mouth daily., Disp: 90 tablet, Rfl: 1 .  metoprolol succinate (TOPROL XL) 100 MG 24 hr tablet, Take 1/2 tablet by mouth daily with or immediately following a meal., Disp: 45 tablet, Rfl: 1 .  morphine (MS CONTIN) 15 MG 12 hr tablet, Take 1 tablet (15 mg total) by mouth every 12 (twelve) hours., Disp: 50 tablet, Rfl: 0 .  morphine (MSIR) 15 MG tablet, Take 1 tablet (15 mg total) by mouth 2 (two) times daily as needed for moderate pain ([facility] Pain)., Disp: 50 tablet, Rfl: 0 .  Multiple Vitamin (MULTIVITAMIN WITH MINERALS) TABS, Take 1 tablet by mouth daily. , Disp: , Rfl:  .  olmesartan (BENICAR) 20 MG tablet, Take 1 tablet (20 mg total) by mouth daily.,  Disp: , Rfl:  .  OXYGEN-HELIUM IN, Inhale 2 L into the lungs at bedtime. And on exertion, Disp: , Rfl:  .  pantoprazole (PROTONIX) 40 MG tablet, take 1 tablet by mouth once daily MINUTES BEFORE 1ST MEAL OF THE DAY, Disp: 90 tablet, Rfl: 0 .  polyethylene glycol (MIRALAX / GLYCOLAX) packet, Take 17 g by mouth  daily., Disp: 14 each, Rfl: 0 .  potassium chloride (K-DUR,KLOR-CON) 10 MEQ tablet, Take 20 mEq by mouth 2 (two) times daily., Disp: , Rfl:  .  PREDNISONE PO, Take 15 mg by mouth every morning. , Disp: , Rfl: 0 .  Semaglutide,0.25 or 0.5MG/DOS, (OZEMPIC, 0.25 OR 0.5 MG/DOSE,) 2 MG/1.5ML SOPN, Inject 0.5 mg into the skin once a week., Disp: 3 pen, Rfl: 1 .  sodium chloride (OCEAN) 0.65 % SOLN nasal spray, Place 1 spray into both nostrils as needed for congestion., Disp: , Rfl:  .  Spacer/Aero-Holding Chambers (AEROCHAMBER MV) inhaler, Use as instructed, Disp: 1 each, Rfl: 0 .  Tofacitinib Citrate (XELJANZ) 5 MG TABS, Take 5 mg by mouth 2 (two) times daily., Disp: , Rfl:    Allergies  Allergen Reactions  . Codeine Swelling and Other (See Comments)    Facial swelling Chest pain and swelling in legs  Facial swelling  . Infliximab Anaphylaxis    "sent me into shock" "sent me into shock"  . Lisinopril Swelling    Face and neck swelling Face and neck swelling     Review of Systems  Constitutional: Negative.  Negative for fatigue.  Eyes: Negative for blurred vision.  Respiratory: Negative.   Cardiovascular: Negative.  Negative for chest pain.  Gastrointestinal: Negative.   Endocrine: Negative.  Negative for polydipsia, polyphagia and polyuria.  Skin: Negative.   Neurological: Negative.  Negative for dizziness.  Psychiatric/Behavioral: Negative for confusion. The patient is not nervous/anxious.      Today's Vitals   05/26/18 1140  BP: 134/80  Pulse: 95  Temp: 98.3 F (36.8 C)  TempSrc: Oral  SpO2: 94%  Weight: 273 lb 6.4 oz (124 kg)  Height: 5' 1.8" (1.57 m)   Body mass  index is 50.33 kg/m.   Objective:  Physical Exam Vitals signs reviewed.  Constitutional:      Appearance: Normal appearance. She is well-developed.  HENT:     Head: Normocephalic and atraumatic.  Eyes:     Pupils: Pupils are equal, round, and reactive to light.  Neck:     Musculoskeletal: Normal range of motion and neck supple.  Cardiovascular:     Rate and Rhythm: Normal rate and regular rhythm.     Pulses: Normal pulses.     Heart sounds: Normal heart sounds. No murmur.  Pulmonary:     Effort: Pulmonary effort is normal.     Breath sounds: Normal breath sounds.  Chest:     Chest wall: No tenderness.  Musculoskeletal: Normal range of motion.        General: Tenderness: cervical and low back.  Skin:    General: Skin is warm and dry.     Capillary Refill: Capillary refill takes less than 2 seconds.  Neurological:     General: No focal deficit present.     Mental Status: She is alert and oriented to person, place, and time.     Cranial Nerves: No cranial nerve deficit.        Assessment And Plan:     1. Essential hypertension . B/P is controlled.  . CMP ordered to check renal function.  . The importance of regular exercise and dietary modification was stressed to the patient.  . Stressed importance of losing ten percent of her body weight to help with B/P control.  . The weight loss would help with decreasing cardiac and cancer risk as well.  - Basic metabolic panel  2. Type 2 diabetes mellitus without complication, without  long-term current use of insulin (HCC)  Chronic, controlled  Continue with current medications  Encouraged to limit intake of sugary foods and drinks  Encouraged to increase physical activity to 150 minutes per week - Hemoglobin A1c - Hepatic function panel - Lipid panel  3. Dyslipidemia  Chronic, controlled  Continue with current medications  4. Primary osteoarthritis of both knees  Chronic, controlled  Continue with current  medications  She is going to a pain specialist  5. ILD (interstitial lung disease) ([place])  She is having shortness of breath with exertion but is currently on prednisone  SPO2 94%  6. Acquired hypothyroidism  Chronic, controlled  Continue with current medications - TSH  7. Body mass index (BMI) of 50-59.9 in adult [facility])  Chronic  Discussed healthy diet and regular exercise options   Encouraged to exercise at least 150 minutes per week with 2 days of strength training  She is planning to be more active as tolerated  8. Rheumatoid arthritis involving both hands with positive rheumatoid factor (HCC)  Chronic, stable  Takes prednisone daily which helps some  Encouraged to limit intake of sugary foods and processed foods.   Minette Brine, FNP

## 2018-05-27 ENCOUNTER — Other Ambulatory Visit: Payer: Self-pay

## 2018-05-27 DIAGNOSIS — E079 Disorder of thyroid, unspecified: Secondary | ICD-10-CM

## 2018-05-27 DIAGNOSIS — I10 Essential (primary) hypertension: Secondary | ICD-10-CM

## 2018-05-27 DIAGNOSIS — E119 Type 2 diabetes mellitus without complications: Secondary | ICD-10-CM

## 2018-05-27 LAB — BASIC METABOLIC PANEL
BUN/Creatinine Ratio: 17 (ref 12–28)
BUN: 20 mg/dL (ref 8–27)
CO2: 29 mmol/L (ref 20–29)
CREATININE: 1.16 mg/dL — AB (ref 0.57–1.00)
Calcium: 8.6 mg/dL — ABNORMAL LOW (ref 8.7–10.3)
Chloride: 100 mmol/L (ref 96–106)
GFR calc Af Amer: 53 mL/min/{1.73_m2} — ABNORMAL LOW (ref >59)
GFR calc non Af Amer: 46 mL/min/{1.73_m2} — ABNORMAL LOW (ref >59)
GLUCOSE: 126 mg/dL — AB (ref 65–99)
Potassium: 3.9 mmol/L (ref 3.5–5.2)
Sodium: 148 mmol/L — ABNORMAL HIGH (ref 134–144)

## 2018-05-27 LAB — HEPATIC FUNCTION PANEL
ALT: 54 IU/L — ABNORMAL HIGH (ref 0–32)
AST: 30 IU/L (ref 0–40)
Albumin: 4.3 g/dL (ref 3.5–4.8)
Alkaline Phosphatase: 82 IU/L (ref 39–117)
Bilirubin Total: 0.7 mg/dL (ref 0.0–1.2)
Bilirubin, Direct: 0.25 mg/dL (ref 0.00–0.40)
Total Protein: 6.1 g/dL (ref 6.0–8.5)

## 2018-05-27 LAB — HEMOGLOBIN A1C
Est. average glucose Bld gHb Est-mCnc: 137 mg/dL
HEMOGLOBIN A1C: 6.4 % — AB (ref 4.8–5.6)

## 2018-05-27 LAB — LIPID PANEL
Chol/HDL Ratio: 2.3 ratio (ref 0.0–4.4)
Cholesterol, Total: 150 mg/dL (ref 100–199)
HDL: 65 mg/dL (ref >39)
LDL Calculated: 63 mg/dL (ref 0–99)
Triglycerides: 111 mg/dL (ref 0–149)
VLDL Cholesterol Cal: 22 mg/dL (ref 5–40)

## 2018-05-27 LAB — TSH: TSH: 3.51 u[IU]/mL (ref 0.450–4.500)

## 2018-05-27 LAB — T4, FREE: Free T4: 1.52 ng/dL (ref 0.82–1.77)

## 2018-05-27 LAB — T3: T3, Total: 99 ng/dL (ref 71–180)

## 2018-05-27 MED ORDER — METOPROLOL SUCCINATE ER 100 MG PO TB24: ORAL_TABLET | ORAL | 1 refills | Status: DC

## 2018-05-27 MED ORDER — POLYETHYLENE GLYCOL 3350 17 G PO PACK: 17.0000 g | PACK | Freq: Every day | ORAL | 2 refills | Status: DC

## 2018-05-27 MED ORDER — PANTOPRAZOLE SODIUM 40 MG PO TBEC: DELAYED_RELEASE_TABLET | ORAL | 1 refills | Status: DC

## 2018-05-27 MED ORDER — TOFACITINIB CITRATE 5 MG PO TABS: 5.0000 mg | ORAL_TABLET | Freq: Two times a day (BID) | ORAL | 1 refills | Status: DC

## 2018-05-27 MED ORDER — LEVOTHYROXINE SODIUM 50 MCG PO TABS: 50.0000 ug | ORAL_TABLET | Freq: Every day | ORAL | 1 refills | Status: DC

## 2018-05-27 MED ORDER — SEMAGLUTIDE(0.25 OR 0.5MG/DOS) 2 MG/1.5ML ~~LOC~~ SOPN: 0.5000 mg | PEN_INJECTOR | SUBCUTANEOUS | 1 refills | Status: DC

## 2018-05-27 MED ORDER — OLMESARTAN MEDOXOMIL 20 MG PO TABS: 20.0000 mg | ORAL_TABLET | Freq: Every day | ORAL | 1 refills | Status: DC

## 2018-05-27 MED ORDER — FLUTICASONE PROPIONATE 50 MCG/ACT NA SUSP: 2.0000 | Freq: Every day | NASAL | 3 refills | Status: DC

## 2018-05-31 DIAGNOSIS — R29898 Other symptoms and signs involving the musculoskeletal system: Secondary | ICD-10-CM | POA: Diagnosis not present

## 2018-05-31 DIAGNOSIS — Z5181 Encounter for therapeutic drug level monitoring: Secondary | ICD-10-CM | POA: Diagnosis not present

## 2018-05-31 DIAGNOSIS — E8770 Fluid overload, unspecified: Secondary | ICD-10-CM | POA: Diagnosis not present

## 2018-05-31 DIAGNOSIS — M0579 Rheumatoid arthritis with rheumatoid factor of multiple sites without organ or systems involvement: Secondary | ICD-10-CM | POA: Diagnosis not present

## 2018-06-01 ENCOUNTER — Other Ambulatory Visit: Payer: Medicare Other | Admitting: *Deleted

## 2018-06-01 ENCOUNTER — Other Ambulatory Visit: Payer: Medicare Other | Admitting: Licensed Clinical Social Worker

## 2018-06-01 DIAGNOSIS — Z515 Encounter for palliative care: Secondary | ICD-10-CM

## 2018-06-01 NOTE — Progress Notes (Signed)
COMMUNITY PALLIATIVE CARE SW NOTE  PATIENT NAME: [name] DOB: 17-Jan-1941 MRN: [id number]  PRIMARY CARE PROVIDER: Glendale Chard, MD  RESPONSIBLE PARTY:  Acct ID - Guarantor Home Phone Work Phone Relationship Acct Type  [id number] [name], [name] Self P/F     [address], [name], [place] [address]     PLAN OF CARE and INTERVENTIONS:             1. GOALS OF CARE/ ADVANCE CARE PLANNING:  Patient wishes to remain at home with her husband.  She has a DNR. 2. SOCIAL/EMOTIONAL/SPIRITUAL ASSESSMENT/ INTERVENTIONS:  SW and Palliative Care RN, Daryl Eastern, met with patient in her home.  SW provided active listening and supportive counseling while she expressed her frustration with her continued symptoms.  She feels she continues retaining fluid.  She appeared to respond positively to life review.  Patient reported a 5 on a 1-10 pain scale.  She said her lower back hurt and her left leg.  She also reported her husband's health is declining. 3. PATIENT/CAREGIVER EDUCATION/ COPING: Patient copes by expressing her feelings openly.  Her son and daughter were briefly in attendance and stated patient will lash out at times.  Her daughter stated patient agreed to seeing a therapist for Cognitive Behavioral Therapy.  4. PERSONAL EMERGENCY PLAN:  Patient states she rests when she becomes fatigued.  She carries a cell phone with her for emergencies. 5. COMMUNITY RESOURCES COORDINATION/ HEALTH CARE NAVIGATION:  None. 6. FINANCIAL/LEGAL CONCERNS/INTERVENTIONS:  None.     SOCIAL HX:  Social History   Tobacco Use  . Smoking status: Never Smoker  . Smokeless tobacco: Never Used  Substance Use Topics  . Alcohol use: No    CODE STATUS:  DNR ADVANCED DIRECTIVES: N MOST FORM COMPLETE:  N HOSPICE EDUCATION PROVIDED: N PPS: Patient stated her appetite is normal.  She uses a walker and a w/c for long distances. Duration of visit and documentation:  90  minutes.      Creola Corn Fabien [place], LCSW

## 2018-06-02 ENCOUNTER — Other Ambulatory Visit: Payer: Self-pay

## 2018-06-02 ENCOUNTER — Encounter (HOSPITAL_COMMUNITY): Payer: Self-pay | Admitting: Emergency Medicine

## 2018-06-02 ENCOUNTER — Encounter: Payer: Self-pay | Admitting: Registered Nurse

## 2018-06-02 ENCOUNTER — Encounter: Payer: Medicare Other | Attending: Physical Medicine & Rehabilitation | Admitting: Registered Nurse

## 2018-06-02 ENCOUNTER — Emergency Department (HOSPITAL_COMMUNITY): Payer: Medicare Other

## 2018-06-02 ENCOUNTER — Observation Stay (HOSPITAL_COMMUNITY)
Admission: EM | Admit: 2018-06-02 | Discharge: 2018-06-05 | Disposition: A | Discharge status: Home / Self Care | Payer: Medicare Other | Attending: Internal Medicine | Admitting: Internal Medicine

## 2018-06-02 VITALS — BP 95/63 | HR 94 | Ht 62.0 in | Wt 270.0 lb

## 2018-06-02 DIAGNOSIS — Z7982 Long term (current) use of aspirin: Secondary | ICD-10-CM | POA: Insufficient documentation

## 2018-06-02 DIAGNOSIS — E039 Hypothyroidism, unspecified: Secondary | ICD-10-CM | POA: Diagnosis not present

## 2018-06-02 DIAGNOSIS — M1712 Unilateral primary osteoarthritis, left knee: Secondary | ICD-10-CM | POA: Diagnosis not present

## 2018-06-02 DIAGNOSIS — I82409 Acute embolism and thrombosis of unspecified deep veins of unspecified lower extremity: Secondary | ICD-10-CM

## 2018-06-02 DIAGNOSIS — N390 Urinary tract infection, site not specified: Secondary | ICD-10-CM | POA: Insufficient documentation

## 2018-06-02 DIAGNOSIS — M5416 Radiculopathy, lumbar region: Secondary | ICD-10-CM | POA: Diagnosis not present

## 2018-06-02 DIAGNOSIS — Z9889 Other specified postprocedural states: Secondary | ICD-10-CM | POA: Insufficient documentation

## 2018-06-02 DIAGNOSIS — K219 Gastro-esophageal reflux disease without esophagitis: Secondary | ICD-10-CM | POA: Diagnosis present

## 2018-06-02 DIAGNOSIS — I82442 Acute embolism and thrombosis of left tibial vein: Principal | ICD-10-CM | POA: Insufficient documentation

## 2018-06-02 DIAGNOSIS — M109 Gout, unspecified: Secondary | ICD-10-CM | POA: Diagnosis not present

## 2018-06-02 DIAGNOSIS — E7800 Pure hypercholesterolemia, unspecified: Secondary | ICD-10-CM | POA: Insufficient documentation

## 2018-06-02 DIAGNOSIS — R55 Syncope and collapse: Secondary | ICD-10-CM

## 2018-06-02 DIAGNOSIS — F329 Major depressive disorder, single episode, unspecified: Secondary | ICD-10-CM | POA: Insufficient documentation

## 2018-06-02 DIAGNOSIS — N183 Chronic kidney disease, stage 3 (moderate): Secondary | ICD-10-CM | POA: Diagnosis not present

## 2018-06-02 DIAGNOSIS — Z79899 Other long term (current) drug therapy: Secondary | ICD-10-CM

## 2018-06-02 DIAGNOSIS — G894 Chronic pain syndrome: Secondary | ICD-10-CM

## 2018-06-02 DIAGNOSIS — Z79891 Long term (current) use of opiate analgesic: Secondary | ICD-10-CM

## 2018-06-02 DIAGNOSIS — J8410 Pulmonary fibrosis, unspecified: Secondary | ICD-10-CM | POA: Insufficient documentation

## 2018-06-02 DIAGNOSIS — E861 Hypovolemia: Secondary | ICD-10-CM

## 2018-06-02 DIAGNOSIS — Z7901 Long term (current) use of anticoagulants: Secondary | ICD-10-CM | POA: Diagnosis not present

## 2018-06-02 DIAGNOSIS — M47816 Spondylosis without myelopathy or radiculopathy, lumbar region: Secondary | ICD-10-CM | POA: Diagnosis not present

## 2018-06-02 DIAGNOSIS — Z6841 Body Mass Index (BMI) 40.0 and over, adult: Secondary | ICD-10-CM | POA: Insufficient documentation

## 2018-06-02 DIAGNOSIS — I9589 Other hypotension: Secondary | ICD-10-CM | POA: Diagnosis not present

## 2018-06-02 DIAGNOSIS — M2550 Pain in unspecified joint: Secondary | ICD-10-CM

## 2018-06-02 DIAGNOSIS — R0789 Other chest pain: Secondary | ICD-10-CM | POA: Insufficient documentation

## 2018-06-02 DIAGNOSIS — Z90710 Acquired absence of both cervix and uterus: Secondary | ICD-10-CM | POA: Diagnosis not present

## 2018-06-02 DIAGNOSIS — R2689 Other abnormalities of gait and mobility: Secondary | ICD-10-CM | POA: Insufficient documentation

## 2018-06-02 DIAGNOSIS — M7062 Trochanteric bursitis, left hip: Secondary | ICD-10-CM

## 2018-06-02 DIAGNOSIS — Z885 Allergy status to narcotic agent status: Secondary | ICD-10-CM | POA: Insufficient documentation

## 2018-06-02 DIAGNOSIS — E1140 Type 2 diabetes mellitus with diabetic neuropathy, unspecified: Secondary | ICD-10-CM

## 2018-06-02 DIAGNOSIS — R079 Chest pain, unspecified: Secondary | ICD-10-CM | POA: Diagnosis present

## 2018-06-02 DIAGNOSIS — M1711 Unilateral primary osteoarthritis, right knee: Secondary | ICD-10-CM | POA: Diagnosis not present

## 2018-06-02 DIAGNOSIS — F32A Depression, unspecified: Secondary | ICD-10-CM | POA: Diagnosis present

## 2018-06-02 DIAGNOSIS — Z794 Long term (current) use of insulin: Secondary | ICD-10-CM | POA: Insufficient documentation

## 2018-06-02 DIAGNOSIS — E785 Hyperlipidemia, unspecified: Secondary | ICD-10-CM | POA: Diagnosis present

## 2018-06-02 DIAGNOSIS — Z888 Allergy status to other drugs, medicaments and biological substances status: Secondary | ICD-10-CM | POA: Insufficient documentation

## 2018-06-02 DIAGNOSIS — M62838 Other muscle spasm: Secondary | ICD-10-CM

## 2018-06-02 DIAGNOSIS — I10 Essential (primary) hypertension: Secondary | ICD-10-CM | POA: Diagnosis present

## 2018-06-02 DIAGNOSIS — G8929 Other chronic pain: Secondary | ICD-10-CM | POA: Diagnosis present

## 2018-06-02 DIAGNOSIS — J9611 Chronic respiratory failure with hypoxia: Secondary | ICD-10-CM | POA: Diagnosis present

## 2018-06-02 DIAGNOSIS — M7061 Trochanteric bursitis, right hip: Secondary | ICD-10-CM

## 2018-06-02 DIAGNOSIS — Z9981 Dependence on supplemental oxygen: Secondary | ICD-10-CM | POA: Insufficient documentation

## 2018-06-02 DIAGNOSIS — E1142 Type 2 diabetes mellitus with diabetic polyneuropathy: Secondary | ICD-10-CM | POA: Insufficient documentation

## 2018-06-02 DIAGNOSIS — J449 Chronic obstructive pulmonary disease, unspecified: Secondary | ICD-10-CM | POA: Diagnosis not present

## 2018-06-02 DIAGNOSIS — N179 Acute kidney failure, unspecified: Secondary | ICD-10-CM | POA: Diagnosis not present

## 2018-06-02 DIAGNOSIS — E079 Disorder of thyroid, unspecified: Secondary | ICD-10-CM | POA: Diagnosis present

## 2018-06-02 DIAGNOSIS — J849 Interstitial pulmonary disease, unspecified: Secondary | ICD-10-CM | POA: Diagnosis present

## 2018-06-02 DIAGNOSIS — E876 Hypokalemia: Secondary | ICD-10-CM | POA: Diagnosis not present

## 2018-06-02 DIAGNOSIS — E1122 Type 2 diabetes mellitus with diabetic chronic kidney disease: Secondary | ICD-10-CM | POA: Diagnosis not present

## 2018-06-02 DIAGNOSIS — D329 Benign neoplasm of meninges, unspecified: Secondary | ICD-10-CM | POA: Diagnosis present

## 2018-06-02 DIAGNOSIS — I130 Hypertensive heart and chronic kidney disease with heart failure and stage 1 through stage 4 chronic kidney disease, or unspecified chronic kidney disease: Secondary | ICD-10-CM | POA: Insufficient documentation

## 2018-06-02 DIAGNOSIS — M069 Rheumatoid arthritis, unspecified: Secondary | ICD-10-CM | POA: Diagnosis not present

## 2018-06-02 DIAGNOSIS — M48062 Spinal stenosis, lumbar region with neurogenic claudication: Secondary | ICD-10-CM | POA: Diagnosis not present

## 2018-06-02 DIAGNOSIS — I5032 Chronic diastolic (congestive) heart failure: Secondary | ICD-10-CM | POA: Diagnosis not present

## 2018-06-02 DIAGNOSIS — N189 Chronic kidney disease, unspecified: Secondary | ICD-10-CM

## 2018-06-02 DIAGNOSIS — Z5181 Encounter for therapeutic drug level monitoring: Secondary | ICD-10-CM

## 2018-06-02 DIAGNOSIS — M48061 Spinal stenosis, lumbar region without neurogenic claudication: Secondary | ICD-10-CM | POA: Diagnosis not present

## 2018-06-02 DIAGNOSIS — I959 Hypotension, unspecified: Secondary | ICD-10-CM | POA: Diagnosis present

## 2018-06-02 LAB — URINALYSIS, ROUTINE W REFLEX MICROSCOPIC
Bilirubin Urine: NEGATIVE
Glucose, UA: NEGATIVE mg/dL
Ketones, ur: NEGATIVE mg/dL
Nitrite: NEGATIVE
Protein, ur: NEGATIVE mg/dL
Specific Gravity, Urine: 1.016 (ref 1.005–1.030)
WBC, UA: >50 WBC/hpf — ABNORMAL HIGH (ref 0–5)
pH: 5 (ref 5.0–8.0)

## 2018-06-02 LAB — BASIC METABOLIC PANEL
Anion gap: 12 (ref 5–15)
BUN: 22 mg/dL (ref 8–23)
CO2: 29 mmol/L (ref 22–32)
Calcium: 8.6 mg/dL — ABNORMAL LOW (ref 8.9–10.3)
Chloride: 97 mmol/L — ABNORMAL LOW (ref 98–111)
Creatinine, Ser: 1.43 mg/dL — ABNORMAL HIGH (ref 0.44–1.00)
GFR calc Af Amer: 41 mL/min — ABNORMAL LOW (ref >60)
GFR calc non Af Amer: 35 mL/min — ABNORMAL LOW (ref >60)
Glucose, Bld: 204 mg/dL — ABNORMAL HIGH (ref 70–99)
Potassium: 3.6 mmol/L (ref 3.5–5.1)
Sodium: 138 mmol/L (ref 135–145)

## 2018-06-02 LAB — I-STAT TROPONIN, ED: Troponin i, poc: 0.01 ng/mL (ref 0.00–0.08)

## 2018-06-02 LAB — CBC
HCT: 40.6 % (ref 36.0–46.0)
Hemoglobin: 12.2 g/dL (ref 12.0–15.0)
MCH: 30 pg (ref 26.0–34.0)
MCHC: 30 g/dL (ref 30.0–36.0)
MCV: 99.8 fL (ref 80.0–100.0)
NRBC: 0 % (ref 0.0–0.2)
Platelets: 167 10*3/uL (ref 150–400)
RBC: 4.07 MIL/uL (ref 3.87–5.11)
RDW: 15.2 % (ref 11.5–15.5)
WBC: 5.7 10*3/uL (ref 4.0–10.5)

## 2018-06-02 LAB — GLUCOSE, CAPILLARY: Glucose-Capillary: 147 mg/dL — ABNORMAL HIGH (ref 70–99)

## 2018-06-02 LAB — BRAIN NATRIURETIC PEPTIDE: B Natriuretic Peptide: 7.9 pg/mL (ref 0.0–100.0)

## 2018-06-02 LAB — TROPONIN I: Troponin I: <0.03 ng/mL (ref <0.03)

## 2018-06-02 MED ORDER — ACETAMINOPHEN 500 MG PO TABS
1000.0000 mg | ORAL_TABLET | Freq: Four times a day (QID) | ORAL | Status: DC | PRN
  Administered 2018-06-03: 1000 mg via ORAL
  Filled 2018-06-02 (×2): qty 2

## 2018-06-02 MED ORDER — FLUTICASONE PROPIONATE 50 MCG/ACT NA SUSP
2.0000 | Freq: Every day | NASAL | Status: DC
  Administered 2018-06-02 – 2018-06-05 (×4): 2 via NASAL
  Filled 2018-06-02: qty 16

## 2018-06-02 MED ORDER — ALBUTEROL SULFATE (2.5 MG/3ML) 0.083% IN NEBU: 3.0000 mL | INHALATION_SOLUTION | Freq: Four times a day (QID) | RESPIRATORY_TRACT | Status: DC | PRN

## 2018-06-02 MED ORDER — CALCIUM CARBONATE-VITAMIN D 500-200 MG-UNIT PO TABS
1.0000 | ORAL_TABLET | Freq: Every day | ORAL | Status: DC
  Administered 2018-06-03 – 2018-06-05 (×3): 1 via ORAL
  Filled 2018-06-02 (×3): qty 1

## 2018-06-02 MED ORDER — METOPROLOL SUCCINATE ER 50 MG PO TB24
50.0000 mg | ORAL_TABLET | Freq: Every day | ORAL | Status: DC
  Administered 2018-06-03 – 2018-06-05 (×3): 50 mg via ORAL
  Filled 2018-06-02 (×3): qty 1

## 2018-06-02 MED ORDER — IRBESARTAN 150 MG PO TABS
150.0000 mg | ORAL_TABLET | Freq: Every day | ORAL | Status: DC
  Administered 2018-06-03 – 2018-06-04 (×2): 150 mg via ORAL
  Filled 2018-06-02 (×2): qty 1

## 2018-06-02 MED ORDER — ASPIRIN 325 MG PO TABS
325.0000 mg | ORAL_TABLET | Freq: Every day | ORAL | Status: DC
  Administered 2018-06-03 – 2018-06-05 (×3): 325 mg via ORAL
  Filled 2018-06-02 (×3): qty 1

## 2018-06-02 MED ORDER — COLCHICINE 0.6 MG PO TABS
0.6000 mg | ORAL_TABLET | Freq: Every day | ORAL | Status: DC
  Administered 2018-06-03 – 2018-06-05 (×3): 0.6 mg via ORAL
  Filled 2018-06-02 (×3): qty 1

## 2018-06-02 MED ORDER — PREDNISONE 5 MG PO TABS
12.5000 mg | ORAL_TABLET | Freq: Every morning | ORAL | Status: DC
  Administered 2018-06-03 – 2018-06-05 (×3): 12.5 mg via ORAL
  Filled 2018-06-02 (×3): qty 1

## 2018-06-02 MED ORDER — MORPHINE SULFATE 15 MG PO TABS
15.0000 mg | ORAL_TABLET | Freq: Two times a day (BID) | ORAL | Status: DC | PRN
  Administered 2018-06-03 – 2018-06-05 (×3): 15 mg via ORAL
  Filled 2018-06-02 (×3): qty 1

## 2018-06-02 MED ORDER — ENOXAPARIN SODIUM 30 MG/0.3ML ~~LOC~~ SOLN
30.0000 mg | SUBCUTANEOUS | Status: DC
  Administered 2018-06-02: 30 mg via SUBCUTANEOUS
  Filled 2018-06-02: qty 0.3

## 2018-06-02 MED ORDER — FUROSEMIDE 80 MG PO TABS
80.0000 mg | ORAL_TABLET | Freq: Every day | ORAL | Status: DC
  Administered 2018-06-02: 80 mg via ORAL
  Filled 2018-06-02: qty 1

## 2018-06-02 MED ORDER — MORPHINE SULFATE ER 15 MG PO TBCR
15.0000 mg | EXTENDED_RELEASE_TABLET | Freq: Two times a day (BID) | ORAL | Status: DC
  Administered 2018-06-02 – 2018-06-05 (×6): 15 mg via ORAL
  Filled 2018-06-02 (×6): qty 1

## 2018-06-02 MED ORDER — DICLOFENAC SODIUM 1 % TD GEL
2.0000 g | Freq: Three times a day (TID) | TRANSDERMAL | Status: DC
  Administered 2018-06-02 – 2018-06-05 (×8): 2 g via TOPICAL
  Filled 2018-06-02: qty 100

## 2018-06-02 MED ORDER — ADULT MULTIVITAMIN W/MINERALS CH
1.0000 | ORAL_TABLET | Freq: Every day | ORAL | Status: DC
  Administered 2018-06-02 – 2018-06-05 (×4): 1 via ORAL
  Filled 2018-06-02 (×4): qty 1

## 2018-06-02 MED ORDER — SODIUM CHLORIDE 0.9 % IV BOLUS
500.0000 mL | Freq: Once | INTRAVENOUS | Status: AC
  Administered 2018-06-02: 500 mL via INTRAVENOUS

## 2018-06-02 MED ORDER — ALLOPURINOL 300 MG PO TABS
300.0000 mg | ORAL_TABLET | Freq: Every day | ORAL | Status: DC
  Administered 2018-06-03 – 2018-06-05 (×3): 300 mg via ORAL
  Filled 2018-06-02 (×3): qty 1

## 2018-06-02 MED ORDER — TOFACITINIB CITRATE 5 MG PO TABS
5.0000 mg | ORAL_TABLET | Freq: Two times a day (BID) | ORAL | Status: DC
  Administered 2018-06-04 – 2018-06-05 (×2): 5 mg via ORAL

## 2018-06-02 MED ORDER — SODIUM CHLORIDE 0.9% FLUSH: 3.0000 mL | Freq: Once | INTRAVENOUS | Status: DC

## 2018-06-02 MED ORDER — AZITHROMYCIN 250 MG PO TABS
500.0000 mg | ORAL_TABLET | ORAL | Status: DC
  Administered 2018-06-04: 500 mg via ORAL
  Filled 2018-06-02: qty 2

## 2018-06-02 MED ORDER — INSULIN ASPART 100 UNIT/ML ~~LOC~~ SOLN: 0.0000 [IU] | Freq: Every day | SUBCUTANEOUS | Status: DC

## 2018-06-02 MED ORDER — GABAPENTIN 600 MG PO TABS
1200.0000 mg | ORAL_TABLET | Freq: Three times a day (TID) | ORAL | Status: DC
  Administered 2018-06-02 – 2018-06-05 (×8): 1200 mg via ORAL
  Filled 2018-06-02 (×8): qty 2

## 2018-06-02 MED ORDER — VITAMIN D 25 MCG (1000 UNIT) PO TABS
2000.0000 [IU] | ORAL_TABLET | Freq: Every day | ORAL | Status: DC
  Administered 2018-06-03 – 2018-06-05 (×3): 2000 [IU] via ORAL
  Filled 2018-06-02 (×3): qty 2

## 2018-06-02 MED ORDER — INSULIN ASPART 100 UNIT/ML ~~LOC~~ SOLN
0.0000 [IU] | Freq: Three times a day (TID) | SUBCUTANEOUS | Status: DC
  Administered 2018-06-03: 3 [IU] via SUBCUTANEOUS
  Administered 2018-06-04 (×2): 2 [IU] via SUBCUTANEOUS

## 2018-06-02 MED ORDER — POTASSIUM CHLORIDE CRYS ER 10 MEQ PO TBCR
10.0000 meq | EXTENDED_RELEASE_TABLET | Freq: Every day | ORAL | Status: DC
  Administered 2018-06-02: 10 meq via ORAL
  Filled 2018-06-02: qty 1

## 2018-06-02 MED ORDER — IPRATROPIUM-ALBUTEROL 0.5-2.5 (3) MG/3ML IN SOLN: 3.0000 mL | Freq: Four times a day (QID) | RESPIRATORY_TRACT | Status: DC | PRN

## 2018-06-02 MED ORDER — ONDANSETRON HCL 4 MG/2ML IJ SOLN: 4.0000 mg | Freq: Four times a day (QID) | INTRAMUSCULAR | Status: DC | PRN

## 2018-06-02 MED ORDER — SALINE SPRAY 0.65 % NA SOLN: 1.0000 | NASAL | Status: DC | PRN

## 2018-06-02 MED ORDER — GUAIFENESIN ER 600 MG PO TB12
1200.0000 mg | ORAL_TABLET | Freq: Two times a day (BID) | ORAL | Status: DC
  Administered 2018-06-02 – 2018-06-05 (×6): 1200 mg via ORAL
  Filled 2018-06-02 (×6): qty 2

## 2018-06-02 MED ORDER — SODIUM CHLORIDE 0.9 % IV SOLN
1.0000 g | INTRAVENOUS | Status: DC
  Administered 2018-06-02 – 2018-06-04 (×2): 1 g via INTRAVENOUS
  Filled 2018-06-02 (×2): qty 10

## 2018-06-02 MED ORDER — DOCUSATE SODIUM 100 MG PO CAPS
100.0000 mg | ORAL_CAPSULE | Freq: Two times a day (BID) | ORAL | Status: DC
  Administered 2018-06-02 – 2018-06-05 (×6): 100 mg via ORAL
  Filled 2018-06-02 (×6): qty 1

## 2018-06-02 MED ORDER — PANTOPRAZOLE SODIUM 40 MG PO TBEC
40.0000 mg | DELAYED_RELEASE_TABLET | Freq: Every day | ORAL | Status: DC
  Administered 2018-06-03 – 2018-06-05 (×3): 40 mg via ORAL
  Filled 2018-06-02 (×3): qty 1

## 2018-06-02 MED ORDER — CYCLOBENZAPRINE HCL 5 MG PO TABS: 5.0000 mg | ORAL_TABLET | Freq: Every evening | ORAL | Status: DC | PRN

## 2018-06-02 MED ORDER — DULOXETINE HCL 60 MG PO CPEP
60.0000 mg | ORAL_CAPSULE | Freq: Every day | ORAL | Status: DC
  Administered 2018-06-03 – 2018-06-05 (×3): 60 mg via ORAL
  Filled 2018-06-02 (×3): qty 1

## 2018-06-02 MED ORDER — SEMAGLUTIDE(0.25 OR 0.5MG/DOS) 2 MG/1.5ML ~~LOC~~ SOPN
0.5000 mg | PEN_INJECTOR | SUBCUTANEOUS | Status: DC
  Filled 2018-06-02: qty 1

## 2018-06-02 MED ORDER — HYDROXYCHLOROQUINE SULFATE 200 MG PO TABS
200.0000 mg | ORAL_TABLET | Freq: Every day | ORAL | Status: DC
  Administered 2018-06-03 – 2018-06-05 (×3): 200 mg via ORAL
  Filled 2018-06-02 (×3): qty 1

## 2018-06-02 MED ORDER — LEVOTHYROXINE SODIUM 50 MCG PO TABS
50.0000 ug | ORAL_TABLET | Freq: Every day | ORAL | Status: DC
  Administered 2018-06-03 – 2018-06-05 (×3): 50 ug via ORAL
  Filled 2018-06-02 (×3): qty 1

## 2018-06-02 MED ORDER — ATORVASTATIN CALCIUM 10 MG PO TABS
10.0000 mg | ORAL_TABLET | Freq: Every evening | ORAL | Status: DC
  Administered 2018-06-02 – 2018-06-04 (×3): 10 mg via ORAL
  Filled 2018-06-02 (×3): qty 1

## 2018-06-02 NOTE — ED Triage Notes (Signed)
Pt sent from PCP for cp for 1 weeks pain starts in top chest

## 2018-06-02 NOTE — Patient Instructions (Signed)
Please Call office on 06/11/2018 Regarding Medication Management

## 2018-06-02 NOTE — H&P (Signed)
History and Physical    [name] FSE:[id number] DOB: December 30, 1940 DOA: 06/02/2018  PCP: Glendale Chard, MD Consultants:  none Patient coming from: home- lives with  Chief Complaint: chest pain  HPI: [name] is a 78 y.o. female with medical history significant for type 2 diabetes, hypertension, hyperlipidemia, interstitial lung disease, arthritis, hypothyroidism, rheumatoid arthritis on long-term opioids, who presented to the ED today after a syncopal episode this morning at her primary care physician's office.  Patient's daughter states that she was helping her mom out of the car and she noticed that her mom's left arm started shaking just before her mom collapsed on her and although eyes were open, she was unresponsive for about 2 to 3 minutes.  Patient had no loss of bowel or bladder control, she has no prior history of seizure.  At her PCP her blood pressure was 86/49.  She was given some p.o. fluids with mild response in her blood pressure.  She was sent to the ED for further evaluation.  She states that her Lasix was increased this past Monday from 80 mg to 120 milligrams a day due to increased BLE edema but that she has not yet increased her dose because she had taken several laxatives for constipation and wanted to avoid dehydration.  She has been complaining of chest tightness for the past 2 weeks. This is substernal, sometimes just to the R of her sternum, "pressure-like" associated with "an exhausted feeling," occasional SOB. No diaphoresis, palpitations. Occ radiates to her back, not to jaw or arm.  She thought it was due to her lung disease and went to see her pulmonologist but evaluation including imaging was all within normal limits.  She has had no diaphoresis, shortness of breath, palpitations.  She is had no nausea, vomiting, diarrhea.  ED Course: She was given a 500 cc bolus for mild tachycardia.  Vital signs were stable and hypotension resolved.  Review of Systems: .  Constipation: has been taking laxative this week several times with resultant loose stools. No dysuria, frequency, abd pain. Otherwise, as per HPI; further review of systems reviewed and negative  Ambulatory Status:  Ambulates without assistance  Past Medical History:  Diagnosis Date  . Acute on chronic diastolic (congestive) heart failure ([place])   . Anemia    iron deficiency anemia - secondary to blood loss ( chronic)   . Arthritis    endstage changes bilateral knees/bilateral ankles.   . Asthma   . Carotid artery occlusion   . Chronic fatigue   . Chronic kidney disease   . Closed left hip fracture ([place])   . Clotting disorder ([place])    pt denies this  . Contusion of left knee    due to fall 1/14.  [name] COPD (chronic obstructive pulmonary disease) (HCC)    pulmonary fibrosis  . Depression, reactive   . Diabetes mellitus    type II   . Diastolic dysfunction   . Difficulty in walking   . Family history of heart disease   . Generalized muscle weakness   . Gout   . High cholesterol   . History of falling   . Hypertension   . Hypothyroidism   . Interstitial lung disease ([place])   . 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    . Morbid obesity ([place])   . Morbid obesity ([place])   . Neuromuscular disorder ([place])  diabetic neuropathy   . Normal coronary arteries    cardiac catheterization performed  10/31/14  . RA (rheumatoid arthritis) ([place])    has been off methotreaxte since 10/13.  [name] Rheumatoid arthritis ([place])   . Shortness of breath   . Spinal stenosis of lumbar region   . Thyroid disease   . Unspecified lack of coordination   . URI (upper respiratory infection)     Past Surgical History:  Procedure Laterality Date  . ABDOMINAL HYSTERECTOMY    . BRAIN SURGERY     Gamma knife 10/13. Needs repeat spring  '14  . CARDIAC CATHETERIZATION N/A 10/31/2014   Procedure: Right/Left Heart Cath and  Coronary Angiography;  Surgeon: [name], MD;  Location: [facility];  Service: Cardiovascular;  Laterality: N/A;  . ESOPHAGOGASTRODUODENOSCOPY (EGD) WITH PROPOFOL N/A 09/14/2014   Procedure: ESOPHAGOGASTRODUODENOSCOPY (EGD) WITH PROPOFOL;  Surgeon: [name], MD;  Location: [facility];  Service: Endoscopy;  Laterality: N/A;  . INCISION AND DRAINAGE HIP Left 01/16/2017   Procedure: IRRIGATION AND DEBRIDEMENT LEFT HIP;  Surgeon: [name], MD;  Location: [facility];  Service: Orthopedics;  Laterality: Left;  . INTRAMEDULLARY (IM) NAIL INTERTROCHANTERIC Left 11/29/2016   Procedure: INTRAMEDULLARY (IM) NAIL INTERTROCHANTRIC;  Surgeon: [name], MD;  Location: [facility];  Service: Orthopedics;  Laterality: Left;  . OVARY SURGERY    . SHOULDER SURGERY Left   . TONSILLECTOMY  age 20  . VIDEO BRONCHOSCOPY Bilateral 05/31/2013   Procedure: VIDEO BRONCHOSCOPY WITHOUT FLUORO;  Surgeon: Brand Males, MD;  Location: [facility];  Service: Cardiopulmonary;  Laterality: Bilateral;  . video bronscoscopy  2000   lung    Social History   Socioeconomic History  . Marital status: Married    Spouse name: Not on file  . Number of children: 6  . Years of education: college  . Highest education level: Not on file  Occupational History  . Occupation: retired Ship broker  . Financial resource strain: Not on file  . Food insecurity:    Worry: Not on file    Inability: Not on file  . Transportation needs:    Medical: Not on file    Non-medical: Not on file  Tobacco Use  . Smoking status: Never Smoker  . Smokeless tobacco: Never Used  Substance and Sexual Activity  . Alcohol use: No  . Drug use: No  . Sexual activity: Not Currently  Lifestyle  . Physical activity:    Days per week: Not on file    Minutes per session: Not on file  . Stress: Not on file  Relationships  . Social connections:    Talks on phone: Not on file    Gets together: Not  on file    Attends religious service: Not on file    Active member of club or organization: Not on file    Attends meetings of clubs or organizations: Not on file    Relationship status: Not on file  . Intimate partner violence:    Fear of current or ex partner: Not on file    Emotionally abused: Not on file    Physically abused: Not on file    Forced sexual activity: Not on file  Other Topics Concern  . Not on file  Social History Narrative   Patient consumes 2-3 cups coffee per day.    Allergies  Allergen Reactions  . Codeine Swelling and Other (See Comments)    Facial swelling Chest pain and  swelling in legs  Facial swelling  . Infliximab Anaphylaxis    "sent me into shock" "sent me into shock"  . Lisinopril Swelling    Face and neck swelling Face and neck swelling    Family History  Problem Relation Age of Onset  . Diabetes Mother   . Heart attack Mother   . Hypertension Father   . Lung cancer Father   . Diabetes Sister   . Diabetes Brother   . Hypertension Brother   . Heart disease Brother   . Heart attack Brother   . Kidney cancer Brother   . Uterine cancer Daughter   . Breast cancer Sister   . Rheum arthritis Maternal Uncle   . Gout Brother   . Kidney failure Brother        x 5    Prior to Admission medications   Medication Sig Start Date End Date Taking? Authorizing Provider  acetaminophen (TYLENOL) 500 MG tablet Take 1,000 mg by mouth every 6 (six) hours as needed for moderate pain.    Yes [provider]  albuterol (PROVENTIL HFA;VENTOLIN HFA) 108 (90 BASE) MCG/ACT inhaler Inhale 2 puffs into the lungs every 6 (six) hours as needed for wheezing or shortness of breath.   Yes [provider]  allopurinol (ZYLOPRIM) 300 MG tablet Take 300 mg by mouth daily.   Yes [provider]  aspirin 325 MG tablet Take 1 tablet (325 mg total) by mouth daily. 01/21/17  Yes [name], MD  atorvastatin (LIPITOR) 20 MG tablet Take  10 mg by mouth every evening.    Yes [provider]  azithromycin (ZITHROMAX) 500 MG tablet Take 1 tablet by mouth daily. TAKE ON MON, WED & FRI 09/20/15  Yes [provider]  Calcium Carb-Cholecalciferol (CALCIUM + D3 PO) Take 2 tablets by mouth daily with lunch.    Yes [provider]  cholecalciferol (VITAMIN D) 1000 UNITS tablet Take 2,000 Units by mouth daily with lunch.    Yes [provider]  colchicine 0.6 MG tablet Take 0.6 mg by mouth daily. 06/25/16  Yes [provider]  cyclobenzaprine (FLEXERIL) 5 MG tablet Take 1 tablet (5 mg total) by mouth at bedtime as needed for muscle spasms. 08/26/17  Yes [name], NP  diclofenac sodium (VOLTAREN) 1 % GEL Apply 1 application topically 3 (three) times daily. Both hands and knees 08/26/17  Yes Danella Sensing L, NP  docusate sodium (COLACE) 100 MG capsule Take 1 capsule (100 mg total) by mouth 2 (two) times daily. 12/02/16  Yes Oswald Hillock, MD  DULoxetine (CYMBALTA) 60 MG capsule Take 1 capsule (60 mg total) by mouth daily. 08/26/17  Yes [name], NP  fluticasone (FLONASE) 50 MCG/ACT nasal spray Place 2 sprays into both nostrils daily. 05/27/18  Yes Minette Brine, FNP  furosemide (LASIX) 80 MG tablet Take 80 mg by mouth daily.  04/25/16  Yes [provider]  gabapentin (NEURONTIN) 600 MG tablet Take two in the morning , Two mid- afternoon and two at bedtime 06/03/17  Yes [name], NP  guaiFENesin (MUCINEX) 600 MG 12 hr tablet Take 1,200 mg by mouth 2 (two) times daily.   Yes [provider]  hydroxychloroquine (PLAQUENIL) 200 MG tablet Take 200 mg by mouth daily.   Yes [provider]  ipratropium-albuterol (DUONEB) 0.5-2.5 (3) MG/3ML SOLN Take 3 mLs by nebulization every 6 (six) hours as needed (cough/wheezing).    Yes [provider]  levothyroxine (  SYNTHROID, LEVOTHROID) 50 MCG tablet Take 1 tablet (50 mcg total) by mouth daily. 05/27/18  Yes Minette Brine, FNP  metoprolol succinate (TOPROL XL) 100 MG 24 hr tablet Take 1/2 tablet by mouth daily with or immediately following a meal. 05/27/18  Yes Minette Brine, FNP  morphine (MS CONTIN) 15 MG 12 hr tablet Take 1 tablet (15 mg total) by mouth every 12 (twelve) hours. 05/04/18  Yes [name], NP  morphine (MSIR) 15 MG tablet Take 1 tablet (15 mg total) by mouth 2 (two) times daily as needed for moderate pain ([facility]). 05/04/18  Yes [name], NP  Multiple Vitamin (MULTIVITAMIN WITH MINERALS) TABS Take 1 tablet by mouth daily.    Yes [provider]  olmesartan (BENICAR) 20 MG tablet Take 1 tablet (20 mg total) by mouth daily. 05/27/18  Yes Minette Brine, FNP  pantoprazole (PROTONIX) 40 MG tablet take 1 tablet by mouth once daily MINUTES BEFORE 1ST MEAL OF THE DAY 05/27/18  Yes Minette Brine, FNP  potassium chloride (K-DUR,KLOR-CON) 10 MEQ tablet Take 10 mEq by mouth daily.    Yes [provider]  predniSONE (DELTASONE) 5 MG tablet Take 12.5 mg by mouth every morning.  06/20/15  Yes [provider]  Semaglutide,0.25 or 0.5MG/DOS, (OZEMPIC, 0.25 OR 0.5 MG/DOSE,) 2 MG/1.5ML SOPN Inject 0.5 mg into the skin once a week. 05/27/18  Yes Minette Brine, FNP  sodium chloride (OCEAN) 0.65 % SOLN nasal spray Place 1 spray into both nostrils as needed for congestion.   Yes [provider]  Tofacitinib Citrate (XELJANZ) 5 MG TABS Take 1 tablet (5 mg total) by mouth 2 (two) times daily. 05/27/18  Yes Minette Brine, FNP  OXYGEN-HELIUM IN Inhale 2 L into the lungs at bedtime. And on exertion    [provider]  polyethylene glycol (MIRALAX / GLYCOLAX) packet Take 17 g by mouth daily. Patient not taking: Reported on 06/02/2018 05/27/18   Minette Brine, FNP  Spacer/Aero-Holding Chambers (AEROCHAMBER MV) inhaler Use as instructed 06/10/13   Melvenia Needles, NP    Physical Exam: Vitals:   06/02/18 1522 06/02/18 1530 06/02/18 1545 06/02/18 1600  BP:  131/88  124/74 113/69  Pulse:  94 93 94  Resp:  (!) 21 (!) 22 (!) 22  Temp:      TempSrc:      SpO2:  97% 96% 95%  Weight: 122.5 kg     Height: _0  (1.575 m)        . General: Obese female. Appears calm and comfortable and is in NAD . Eyes:  PERRL, EOMI, normal lids, iris . ENT:  grossly normal hearing, lips & tongue, mmm . Neck:  supple, no lymphadenopathy . Cardiovascular:  nL S1, S2, normal rate, reg rhythm, no murmur. [name] Respiratory:   CTA bilaterally with no wheezes/rales/rhonchi.  Normal respiratory effort. . Abdomen:  soft, NT, ND, NABS . Back:   grossly normal alignment . Skin:  no rash or lesions seen on limited exam . Musculoskeletal:  grossly normal tone BUE/BLE, good ROM, no bony abnormality or obvious joint deformity . Lower extremities:  2+ pitting BLE edema.  Limited foot exam with no ulcerations.  2+ distal pulses. [name] Psychiatric:  grossly normal mood and affect, speech fluent and appropriate, AOx3 . Neurologic:  CN 2-12 grossly intact, moves all extremities in coordinated fashion, sensation intact, Patellar DTRs 2+ and symmetric    Radiological Exams on Admission: Dg Chest 2 View  Result Date: 06/02/2018 CLINICAL  DATA:  Chest pain and pressure EXAM: CHEST - 2 VIEW COMPARISON:  11/29/2016 FINDINGS: Cardiac shadows within normal limits. Lungs are well aerated bilaterally without focal infiltrate or sizable effusion. No acute bony abnormality is noted. Chronic degenerative changes about the right shoulder joint are seen. IMPRESSION: No acute abnormality noted. Electronically Signed   By: Inez Catalina M.D.   On: 06/02/2018 13:32    EKG: Independently reviewed.  Date/Time:                  Wednesday June 02 2018 12:40:33 EST Ventricular Rate:         93 PR Interval:                 152 QRS Duration: 86 QT Interval:                 382 QTC Calculation:        474 R Axis:                         -30 Text Interpretation:       Normal sinus rhythm Left axis deviation No  significant change since last tracing   Labs on Admission: I have personally reviewed the available labs and imaging studies at the time of the admission.  Pertinent labs:  Sodium 138 potassium 3.6 chloride 97 CO2 29 glucose 204 BUN 22 creatinine 1.43, baseline 1.1-1.3 Calcium 8.6  troponin I 0.01 WBC 5.7 hemoglobin 12.2 platelets 167 Last hemoglobin A1c 6.4 on January 8 Last TSH 3.51 on January 8  Last TTE 04/2015: preserved EF, grade I DD  Assessment/Plan Principal Problem:   Chest pain with moderate risk of acute coronary syndrome Active Problems:   Dyslipidemia   Thyroid disease   Essential hypertension   Rheumatoid arthritis ([place])   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 (HCC)   Spinal stenosis of lumbar region with radiculopathy   ILD (interstitial lung disease) (HCC)   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)   High risk medication use   Morbid obesity ([facility])   Chronic respiratory failure with hypoxia ([facility])   Acute kidney injury superimposed on chronic kidney disease ([facility]CC)   Hypotension   Depression   GERD (gastroesophageal reflux disease)   Syncope and collapse   Chest pain   Acute lower UTI    Syncope, with complaints of chest tightness for 2 weeks.  She has several risk factors for CAD including diabetes mellitus, hypertension, hyperlipidemia, age.  Syncope could simply be due to volume depletion given her hypotension.  However given her risk factors she will need to be ruled out for acute coronary syndrome and risk stratified. -admit to observation, cardiac telemetry -will obtain TTE as she has not had one since 2016 -continuous telemetry -cycle troponins -EKG in a.m.  -NPO after MN for likely stress -cards consulted this evening, have not received call back at this time. Would contact them first thing in a.m.  -cont cardiac meds  AKI on CKD, mild, likely due to overdiuresis/ hypovolemia / hypotension; also with UTI, which  may be contributing -dose all meds for GFR -received 500 cc bolus in ED -hold lasix for one day -followup BMP in am -avoid nephrotoxins -start rocephin for UTI, f/u culture  Acute lower UTI -rocephin 1g q24h; deescalate based on culture  Diabetes mellitus, type II -hold oral meds -SSI, carb controlled diet  Hyperlipidemia -cont atorvastatin  Hypertension -cont Toprol XL, hold  lasix for now  Hypothyroidism -cont home synthroid  Depression -cont home cymbalta  GERD -cont home protonix  Rheumatoid arthritis -cont home prednisone, plaquenil, Tofacitinib -cont home MS Contin, MSIR, voltaren gel  Interstitial lung disease -cont home albuterol, nebs -cont home azithromycin qMWF  Diabetic neuropathy -cont home gabapentin  DVT prophylaxis: lovenox Code Status:  Full - confirmed with patient/family Family Communication: daughter at bedside  Disposition Plan:  Home once clinically improved Consults called: cardiology  Admission status: It is my clinical opinion that referral for OBSERVATION is reasonable and necessary in this patient based on the above information provided. The aforementioned taken together are felt to place the patient at high risk for further clinical deterioration. However it is anticipated that the patient may be medically stable for discharge from the hospital within 24 to 48 hours.     [name] MD [facility]  If note is complete, please contact covering daytime or nighttime physician. [email or web address] Password [facility]  06/02/2018, 4:39 PM

## 2018-06-02 NOTE — ED Provider Notes (Signed)
[facility] Provider Note   CSN: [id number] Arrival date & time: 06/02/18  1235     History   Chief Complaint Chief Complaint  Patient presents with  . Chest Pain    HPI Jaleigha LENEE FRANZE is a 78 y.o. female with past medical history significant for T2DM, HTN, HLD, ILD, arthritis and hypothryroidism who presents today for after syncopal episode at PCP office. Patient daughter's reports this morning while helping her mom out of her car, her left arm start shaking and she passed out on top of her. She reports that mother loss consciousness for about 2-3 minutes and then regain consciousness. No urinary or bowel incontinence, no history of seizure. She was able to get her in her wheel chair before staff from the clinic came to their assistance. At the PCP office, patient was found to have a low blood pressure of 86/49. Patient was given some po fluid with good response in BP. She was sent to the ED for further evaluation.  Recently (past Monday 05/31/2018), patient had her lasix increased from 80 mg daily to 120 mg daily (additional 40 mg) but patient has not increased her dose yet. Patient also complain of chest tightness for the past two weeks. She was recently seen by her pulmonologist at [facility] given her history of ILD but exam, imaging and oxygen saturation were all within normal limits. Patient denies any diaphoresis, radiation to shoulder or jaw, nausea, vomiting, headaches, abdominal. She does have baseline numbness and tingling and in her upper extremities bilaterally.  HPI  Past Medical History:  Diagnosis Date  . Acute on chronic diastolic (congestive) heart failure ([place])   . Anemia    iron deficiency anemia - secondary to blood loss ( chronic)   . Arthritis    endstage changes bilateral knees/bilateral ankles.   . Asthma   . Carotid artery occlusion   . Chronic fatigue   . Chronic kidney disease   . Closed left hip fracture ([place])   .  Clotting disorder ([place])    pt denies this  . Contusion of left knee    due to fall 1/14.  [name] COPD (chronic obstructive pulmonary disease) (HCC)    pulmonary fibrosis  . Depression, reactive   . Diabetes mellitus    type II   . Diastolic dysfunction   . Difficulty in walking   . Family history of heart disease   . Generalized muscle weakness   . Gout   . High cholesterol   . History of falling   . Hypertension   . Hypothyroidism   . Interstitial lung disease ([place])   . 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    . Morbid obesity ([place])   . Morbid obesity ([place])   . Neuromuscular disorder ([place])    diabetic neuropathy   . Normal coronary arteries    cardiac catheterization performed  10/31/14  . RA (rheumatoid arthritis) ([place])    has been off methotreaxte since 10/13.  [name] Rheumatoid arthritis ([place])   . Shortness of breath   . Spinal stenosis of lumbar region   . Thyroid disease   . Unspecified lack of coordination   . URI (upper respiratory infection)     Patient Active Problem List   Diagnosis Date Noted  . Syncope and collapse 06/02/2018  . Chest pain 06/02/2018  . Primary osteoarthritis of both knees 05/26/2018  .  Body mass index (BMI) of 50-59.9 in adult ([place]) 05/26/2018  . Acquired hypothyroidism 05/26/2018  . Chronic bilateral low back pain without sciatica 02/17/2018  . Pain in left hip 02/18/2017  . Wound infection after surgery, initial encounter 01/16/2017  . Closed nondisplaced intertrochanteric fracture of left femur with delayed healing 01/14/2017  . Postoperative wound infection 01/14/2017  . Gout 11/29/2016  . Depression 11/29/2016  . Closed left hip fracture ([place]) 11/29/2016  . GERD (gastroesophageal reflux disease) 11/29/2016  . Closed intertrochanteric fracture of hip, left, initial encounter ([place]) 11/29/2016  . Physical deconditioning 07/31/2015  . Pulmonary  fibrosis, postinflammatory ([place]) 07/31/2015  . Tendinitis of left rotator cuff 06/13/2015  . Acute on chronic diastolic heart failure ([place]) 04/21/2015  . Acute pulmonary edema (HCC)   . AKI (acute kidney injury) ([place])   . Peripheral edema   . Acute on chronic respiratory failure with hypoxia ([place]) 04/17/2015  . CAP (community acquired pneumonia) 04/17/2015  . Acute kidney injury superimposed on chronic kidney disease ([place]) 04/17/2015  . Hypotension 04/17/2015  . Chronic respiratory failure with hypoxia ([place]) 02/09/2015  . Dyspnea and respiratory abnormality 01/03/2015  . Normal coronary arteries 11/02/2014  . Morbid obesity ([place]) 10/30/2014  . Dysphagia 08/21/2014  . Sore throat 07/18/2014  . Chronic fatigue 06/21/2014  . DOE (dyspnea on exertion) 05/24/2014  . High risk medication use 04/17/2014  . Aphasia 02/12/2014  . 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 ([place]) 01/18/2014  . Lumbar facet arthropathy 01/18/2014  . Acute bronchitis 07/24/2013  . Bilateral edema of lower extremity 05/30/2013  . Chronic cough 05/24/2013  . ILD (interstitial lung disease) ([place]) 04/10/2013  . Greater trochanteric bursitis of left hip 08/13/2012  . Spinal stenosis of lumbar region with radiculopathy 07/12/2012  . Inability to walk 07/12/2012  . Acute lumbar back pain 07/12/2012  . Asymptomatic bacteriuria 07/12/2012  . 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 ([place]) 02/17/2012  . Benign neoplasm of meninges ([place]) 02/12/2012  . Brain mass 01/28/2012  . Chest pain with moderate risk of acute coronary syndrome 01/28/2012  . Rheumatoid arthritis ([place]) 01/28/2012  . Primary osteoarthritis of right knee 01/28/2012  . Dyslipidemia   . Thyroid disease   . Essential hypertension     Past Surgical History:  Procedure Laterality Date  . ABDOMINAL HYSTERECTOMY    . BRAIN SURGERY     Gamma knife 10/13. Needs repeat spring  '14  . CARDIAC CATHETERIZATION N/A 10/31/2014    Procedure: Right/Left Heart Cath and Coronary Angiography;  Surgeon: [name], MD;  Location: [facility];  Service: Cardiovascular;  Laterality: N/A;  . ESOPHAGOGASTRODUODENOSCOPY (EGD) WITH PROPOFOL N/A 09/14/2014   Procedure: ESOPHAGOGASTRODUODENOSCOPY (EGD) WITH PROPOFOL;  Surgeon: [name], MD;  Location: [facility];  Service: Endoscopy;  Laterality: N/A;  . INCISION AND DRAINAGE HIP Left 01/16/2017   Procedure: IRRIGATION AND DEBRIDEMENT LEFT HIP;  Surgeon: [name], MD;  Location: [facility];  Service: Orthopedics;  Laterality: Left;  . INTRAMEDULLARY (IM) NAIL INTERTROCHANTERIC Left 11/29/2016   Procedure: INTRAMEDULLARY (IM) NAIL INTERTROCHANTRIC;  Surgeon: [name], MD;  Location: [facility];  Service: Orthopedics;  Laterality: Left;  . OVARY SURGERY    . SHOULDER SURGERY Left   . TONSILLECTOMY  age 71  . VIDEO BRONCHOSCOPY Bilateral 05/31/2013   Procedure: VIDEO BRONCHOSCOPY WITHOUT FLUORO;  Surgeon: Brand Males, MD;  Location: [facility];  Service: Cardiopulmonary;  Laterality: Bilateral;  . video bronscoscopy  2000   lung  OB History   No obstetric history on file.      Home Medications    Prior to Admission medications   Medication Sig Start Date End Date Taking? Authorizing Provider  acetaminophen (TYLENOL) 500 MG tablet Take 1,000 mg by mouth every 6 (six) hours as needed for moderate pain.    Yes [provider]  albuterol (PROVENTIL HFA;VENTOLIN HFA) 108 (90 BASE) MCG/ACT inhaler Inhale 2 puffs into the lungs every 6 (six) hours as needed for wheezing or shortness of breath.   Yes [provider]  allopurinol (ZYLOPRIM) 300 MG tablet Take 300 mg by mouth daily.   Yes [provider]  aspirin 325 MG tablet Take 1 tablet (325 mg total) by mouth daily. 01/21/17  Yes [name], MD  atorvastatin (LIPITOR) 20 MG tablet Take 10 mg by mouth every evening.    Yes [provider]  azithromycin (ZITHROMAX) 500 MG tablet Take 1 tablet by mouth daily. TAKE ON MON, WED & FRI 09/20/15  Yes [provider]  Calcium Carb-Cholecalciferol (CALCIUM + D3 PO) Take 2 tablets by mouth daily with lunch.    Yes [provider]  cholecalciferol (VITAMIN D) 1000 UNITS tablet Take 2,000 Units by mouth daily with lunch.    Yes [provider]  colchicine 0.6 MG tablet Take 0.6 mg by mouth daily. 06/25/16  Yes [provider]  cyclobenzaprine (FLEXERIL) 5 MG tablet Take 1 tablet (5 mg total) by mouth at bedtime as needed for muscle spasms. 08/26/17  Yes [name], NP  diclofenac sodium (VOLTAREN) 1 % GEL Apply 1 application topically 3 (three) times daily. Both hands and knees 08/26/17  Yes Danella Sensing L, NP  docusate sodium (COLACE) 100 MG capsule Take 1 capsule (100 mg total) by mouth 2 (two) times daily. 12/02/16  Yes Oswald Hillock, MD  DULoxetine (CYMBALTA) 60 MG capsule Take 1 capsule (60 mg total) by mouth daily. 08/26/17  Yes [name], NP  fluticasone (FLONASE) 50 MCG/ACT nasal spray Place 2 sprays into both nostrils daily. 05/27/18  Yes Minette Brine, FNP  furosemide (LASIX) 80 MG tablet Take 80 mg by mouth daily.  04/25/16  Yes [provider]  gabapentin (NEURONTIN) 600 MG tablet Take two in the morning , Two mid- afternoon and two at bedtime 06/03/17  Yes [name], NP  guaiFENesin (MUCINEX) 600 MG 12 hr tablet Take 1,200 mg by mouth 2 (two) times daily.   Yes [provider]  hydroxychloroquine (PLAQUENIL) 200 MG tablet Take 200 mg by mouth daily.   Yes [provider]  ipratropium-albuterol (DUONEB) 0.5-2.5 (3) MG/3ML SOLN Take 3 mLs by nebulization every 6 (six) hours as needed (cough/wheezing).    Yes [provider]  levothyroxine (SYNTHROID, LEVOTHROID) 50 MCG tablet Take 1 tablet (50 mcg total) by mouth daily. 05/27/18  Yes Minette Brine, FNP  metoprolol succinate (TOPROL XL) 100 MG 24 hr  tablet Take 1/2 tablet by mouth daily with or immediately following a meal. 05/27/18  Yes Minette Brine, FNP  morphine (MS CONTIN) 15 MG 12 hr tablet Take 1 tablet (15 mg total) by mouth every 12 (twelve) hours. 05/04/18  Yes [name], NP  morphine (MSIR) 15 MG tablet Take 1 tablet (15 mg total) by mouth 2 (two) times daily as needed for moderate pain ([facility]). 05/04/18  Yes [name], NP  Multiple Vitamin (MULTIVITAMIN WITH MINERALS) TABS Take 1 tablet by mouth daily.  Yes [provider]  olmesartan (BENICAR) 20 MG tablet Take 1 tablet (20 mg total) by mouth daily. 05/27/18  Yes Minette Brine, FNP  pantoprazole (PROTONIX) 40 MG tablet take 1 tablet by mouth once daily MINUTES BEFORE 1ST MEAL OF THE DAY 05/27/18  Yes Minette Brine, FNP  potassium chloride (K-DUR,KLOR-CON) 10 MEQ tablet Take 10 mEq by mouth daily.    Yes [provider]  predniSONE (DELTASONE) 5 MG tablet Take 12.5 mg by mouth every morning.  06/20/15  Yes [provider]  Semaglutide,0.25 or 0.5MG/DOS, (OZEMPIC, 0.25 OR 0.5 MG/DOSE,) 2 MG/1.5ML SOPN Inject 0.5 mg into the skin once a week. 05/27/18  Yes Minette Brine, FNP  sodium chloride (OCEAN) 0.65 % SOLN nasal spray Place 1 spray into both nostrils as needed for congestion.   Yes [provider]  Tofacitinib Citrate (XELJANZ) 5 MG TABS Take 1 tablet (5 mg total) by mouth 2 (two) times daily. 05/27/18  Yes Minette Brine, FNP  OXYGEN-HELIUM IN Inhale 2 L into the lungs at bedtime. And on exertion    [provider]  polyethylene glycol (MIRALAX / GLYCOLAX) packet Take 17 g by mouth daily. Patient not taking: Reported on 06/02/2018 05/27/18   Minette Brine, FNP  Spacer/Aero-Holding Chambers (AEROCHAMBER MV) inhaler Use as instructed 06/10/13   Parrett, Fonnie Mu, NP    Family History Family History  Problem Relation Age of Onset  . Diabetes Mother   . Heart attack Mother   . Hypertension Father   . Lung cancer Father     . Diabetes Sister   . Diabetes Brother   . Hypertension Brother   . Heart disease Brother   . Heart attack Brother   . Kidney cancer Brother   . Uterine cancer Daughter   . Breast cancer Sister   . Rheum arthritis Maternal Uncle   . Gout Brother   . Kidney failure Brother        x 5    Social History Social History   Tobacco Use  . Smoking status: Never Smoker  . Smokeless tobacco: Never Used  Substance Use Topics  . Alcohol use: No  . Drug use: No     Allergies   Codeine; Infliximab; and Lisinopril   Review of Systems Review of Systems  Constitutional: Positive for fatigue.  HENT: Negative.   Eyes: Negative.   Respiratory: Positive for chest tightness and shortness of breath.   Cardiovascular: Negative.   Gastrointestinal: Negative.   Endocrine: Negative.   Genitourinary: Negative.   Musculoskeletal: Negative.   Skin: Negative.   Allergic/Immunologic: Negative.   Neurological: Positive for dizziness, weakness and numbness.  Hematological: Negative.   Psychiatric/Behavioral: Negative.      Physical Exam Updated Vital Signs BP 117/74 (BP Location: Left Arm)   Pulse 88   Temp 97.9 F (36.6 C) (Oral)   Resp 14   Ht _0  (1.575 m)   Wt 122.5 kg   SpO2 95%   BMI 49.38 kg/m   Physical Exam Constitutional:      Appearance: She is well-developed.  HENT:     Head: Normocephalic and atraumatic.  Eyes:     Extraocular Movements: Extraocular movements intact.     Pupils: Pupils are equal, round, and reactive to light.  Neck:     Musculoskeletal: Normal range of motion and neck supple.  Cardiovascular:     Rate and Rhythm: Normal rate and regular rhythm.     Pulses:  Carotid pulses are 2+ on the right side and 2+ on the left side.      Radial pulses are 2+ on the right side and 2+ on the left side.       Dorsalis pedis pulses are 2+ on the right side and 2+ on the left side.       Posterior tibial pulses are 2+ on the right side and 2+ on  the left side.     Heart sounds: Normal heart sounds.  Pulmonary:     Effort: Pulmonary effort is normal.     Breath sounds: Normal breath sounds.  Abdominal:     General: Bowel sounds are normal.     Palpations: Abdomen is soft.  Musculoskeletal:     Right lower leg: Edema present.     Left lower leg: Edema present.     Comments: +1 pitting edema bilaterally lower extremities    Skin:    General: Skin is warm and dry.     Capillary Refill: Capillary refill takes less than 2 seconds.  Neurological:     General: No focal deficit present.     Mental Status: She is alert and oriented to person, place, and time.  Psychiatric:        Mood and Affect: Mood normal.      ED Treatments / Results  Labs (all labs ordered are listed, but only abnormal results are displayed) Labs Reviewed  BASIC METABOLIC PANEL - Abnormal; Notable for the following components:      Result Value   Chloride 97 (*)    Glucose, Bld 204 (*)    Creatinine, Ser 1.43 (*)    Calcium 8.6 (*)    GFR calc non Af Amer 35 (*)    GFR calc Af Amer 41 (*)    All other components within normal limits  URINALYSIS, ROUTINE W REFLEX MICROSCOPIC - Abnormal; Notable for the following components:   APPearance CLOUDY (*)    Hgb urine dipstick SMALL (*)    Leukocytes, UA LARGE (*)    WBC, UA >50 (*)    Bacteria, UA FEW (*)    All other components within normal limits  CBC  BRAIN NATRIURETIC PEPTIDE  TROPONIN I  BASIC METABOLIC PANEL  CBC  TROPONIN I  I-STAT TROPONIN, ED    EKG EKG Interpretation  Date/Time:  Wednesday June 02 2018 12:40:33 EST Ventricular Rate:  93 PR Interval:  152 QRS Duration: 86 QT Interval:  382 QTC Calculation: 474 R Axis:   -30 Text Interpretation:  Normal sinus rhythm Left axis deviation No significant change since last tracing Confirmed by Lajean Saver [phone]) on 06/02/2018 3:15:41 PM   Radiology Dg Chest 2 View  Result Date: 06/02/2018 CLINICAL DATA:  Chest pain and  pressure EXAM: CHEST - 2 VIEW COMPARISON:  11/29/2016 FINDINGS: Cardiac shadows within normal limits. Lungs are well aerated bilaterally without focal infiltrate or sizable effusion. No acute bony abnormality is noted. Chronic degenerative changes about the right shoulder joint are seen. IMPRESSION: No acute abnormality noted. Electronically Signed   By: Inez Catalina M.D.   On: 06/02/2018 13:32    Procedures Procedures (including critical care time)  Medications Ordered in ED Medications  atorvastatin (LIPITOR) tablet 10 mg (10 mg Oral Given 06/02/18 1951)  acetaminophen (TYLENOL) tablet 1,000 mg (has no administration in time range)  multivitamin with minerals tablet 1 tablet (1 tablet Oral Given 06/02/18 1951)  calcium-vitamin D (OSCAL WITH D) 500-200 MG-UNIT per tablet 1 tablet (  has no administration in time range)  albuterol (PROVENTIL) (2.5 MG/3ML) 0.083% nebulizer solution 3 mL (has no administration in time range)  cholecalciferol (VITAMIN D3) tablet 2,000 Units (has no administration in time range)  sodium chloride (OCEAN) 0.65 % nasal spray 1 spray (has no administration in time range)  guaiFENesin (MUCINEX) 12 hr tablet 1,200 mg (has no administration in time range)  predniSONE (DELTASONE) tablet 12.5 mg (has no administration in time range)  azithromycin (ZITHROMAX) tablet 500 mg (has no administration in time range)  hydroxychloroquine (PLAQUENIL) tablet 200 mg (has no administration in time range)  furosemide (LASIX) tablet 80 mg (80 mg Oral Given 06/02/18 1951)  docusate sodium (COLACE) capsule 100 mg (has no administration in time range)  potassium chloride (K-DUR,KLOR-CON) CR tablet 10 mEq (10 mEq Oral Given 06/02/18 1951)  ipratropium-albuterol (DUONEB) 0.5-2.5 (3) MG/3ML nebulizer solution 3 mL (has no administration in time range)  aspirin tablet 325 mg (has no administration in time range)  colchicine tablet 0.6 mg (has no administration in time range)  DULoxetine (CYMBALTA)  DR capsule 60 mg (has no administration in time range)  cyclobenzaprine (FLEXERIL) tablet 5 mg (has no administration in time range)  allopurinol (ZYLOPRIM) tablet 300 mg (has no administration in time range)  morphine (MS CONTIN) 12 hr tablet 15 mg (has no administration in time range)  morphine (MSIR) tablet 15 mg (has no administration in time range)  levothyroxine (SYNTHROID, LEVOTHROID) tablet 50 mcg (has no administration in time range)  irbesartan (AVAPRO) tablet 150 mg (has no administration in time range)  pantoprazole (PROTONIX) EC tablet 40 mg (has no administration in time range)  Semaglutide(0.25 or 0.5MG/DOS) SOPN 0.5 mg (has no administration in time range)  Tofacitinib Citrate TABS 5 mg (has no administration in time range)  fluticasone (FLONASE) 50 MCG/ACT nasal spray 2 spray (2 sprays Each Nare Given 06/02/18 1952)  metoprolol succinate (TOPROL-XL) 24 hr tablet 50 mg (has no administration in time range)  gabapentin (NEURONTIN) tablet 1,200 mg (1,200 mg Oral Given 06/02/18 1951)  diclofenac sodium (VOLTAREN) 1 % transdermal gel 2 g (2 g Topical Given 06/02/18 1952)  ondansetron (ZOFRAN) injection 4 mg (has no administration in time range)  insulin aspart (novoLOG) injection 0-15 Units (0 Units Subcutaneous Not Given 06/02/18 1833)  insulin aspart (novoLOG) injection 0-5 Units (has no administration in time range)  enoxaparin (LOVENOX) injection 30 mg (30 mg Subcutaneous Given 06/02/18 1951)  sodium chloride 0.9 % bolus 500 mL (0 mLs Intravenous Stopped 06/02/18 1834)     Initial Impression / Assessment and Plan / ED Course  I have reviewed the triage vital signs and the nursing notes.  Pertinent labs & imaging results that were available during my care of the patient were reviewed by me and considered in my medical decision making (see chart for details).   Patient is a 78 yo female who presents after syncopal episode at PCP office and chest tightness for the past two weeks.  Initially BP was 86/49 with good response with IVF. BP has trended up since admission to the ED. istat trop <0.01 and initial EKG show NSR with no sign of ischemia. Patient has a serum creatinine of 1.4 (1.1 a week ago) consistent AKI in the setting of possible volume depletion while on home Lasix. BG was 204 initially. No history of seizure or incontinence concerning for that. Vital signs have improved since admission but given chest tightness, low BP and mild AKI she will need admission for observation for  chest pain rule and likely cardiology consult for stratification.   Final Clinical Impressions(s) / ED Diagnoses   Final diagnoses:  None    ED Discharge Orders    None       Marjie Skiff, MD 06/02/18 2031    Lajean Saver, MD 06/03/18 1320

## 2018-06-02 NOTE — Progress Notes (Signed)
Subjective:    Patient ID: [name], female    DOB: 01-18-1941, 78 y.o.   MRN: [id number]  HPI: [name] is a 78 y.o. female who returns for follow up appointment for chronic pain and medication refill. She states her  pain is located in her neck, bilateral shoulders, upper- lower back radiating into her bilateral lower extremities, bilateral hip pain and bilateral knee pain. Also reports generalized joint pain all over.  [name] daughter [name]), was assisting [name] out of the car when [name] reports she felt dizzy, her daughter reports [name] became disoriented and wasn't responding for about 5 minutes and she fell forward unto her, she was able to put [name] in her wheelchair.. She summoned another pedestrian to come to our office  for help. Sybil RN assisted  [name] her blood pressure was 83/48, she was given water. [name] reports she noticed these episodes occur after  she takes her lasix, she hasn't seek any medical attention regarding the above. Also reports she's had the syncopal episodes for greater than a week, Also admits she didn't tell her children she didn't want to bother them. Also states today  she has chest pressure mid sternal, denies chest pain radiating into her bilateral upper extremities  and SOB.  NIna ( daughter) refuses EMS to be called, she states she will transport [name] to [facility], try to encourage [name] and her daughter to allow this provider to call EMS, they refused. Blood pressure was re- checked, see flow sheet. CMA transported [name] to her daughter's car. [name] in agreement to be assessed at [facility] Emergency Department and reports  she's having chest pressure, she refuses EMS to be called.   She rated her  Pain 4. Her usual exercise regime is walking in her home with walker, she arrived in a wheelchair today.   [name] Morphine equivalent is 60.00 MME.  Last UDS was Performed on  05/04/2018, it was consistent.   Pain Inventory Average Pain 6 Pain Right Now 4 My pain is intermittent, sharp, burning and aching  In the last 24 hours, has pain interfered with the following? General activity 7 Relation with others 7 Enjoyment of life 7 What TIME of day is your pain at its worst? morning Sleep (in general) Fair  Pain is worse with: walking, bending, standing and some activites Pain improves with: rest, heat/ice, pacing activities, medication and injections Relief from Meds: 7  Mobility use a walker ability to climb steps?  no do you drive?  no  Function retired  Neuro/Psych bladder control problems weakness numbness tremor tingling trouble walking spasms  Prior Studies Any changes since last visit?  no  Physicians involved in your care Any changes since last visit?  no   Family History  Problem Relation Age of Onset  . Diabetes Mother   . Heart attack Mother   . Hypertension Father   . Lung cancer Father   . Diabetes Sister   . Diabetes Brother   . Hypertension Brother   . Heart disease Brother   . Heart attack Brother   . Kidney cancer Brother   . Uterine cancer Daughter   . Breast cancer Sister   . Rheum arthritis Maternal Uncle   . Gout Brother   . Kidney failure Brother        x 5   Social History   Socioeconomic History  . Marital status:  Married    Spouse name: Not on file  . Number of children: 6  . Years of education: college  . Highest education level: Not on file  Occupational History  . Occupation: retired Ship broker  . Financial resource strain: Not on file  . Food insecurity:    Worry: Not on file    Inability: Not on file  . Transportation needs:    Medical: Not on file    Non-medical: Not on file  Tobacco Use  . Smoking status: Never Smoker  . Smokeless tobacco: Never Used  Substance and Sexual Activity  . Alcohol use: No  . Drug use: No  . Sexual activity: Not Currently  Lifestyle  .  Physical activity:    Days per week: Not on file    Minutes per session: Not on file  . Stress: Not on file  Relationships  . Social connections:    Talks on phone: Not on file    Gets together: Not on file    Attends religious service: Not on file    Active member of club or organization: Not on file    Attends meetings of clubs or organizations: Not on file    Relationship status: Not on file  Other Topics Concern  . Not on file  Social History Narrative   Patient consumes 2-3 cups coffee per day.   Past Surgical History:  Procedure Laterality Date  . ABDOMINAL HYSTERECTOMY    . BRAIN SURGERY     Gamma knife 10/13. Needs repeat spring  '14  . CARDIAC CATHETERIZATION N/A 10/31/2014   Procedure: Right/Left Heart Cath and Coronary Angiography;  Surgeon: [name], MD;  Location: [place] [facility];  Service: Cardiovascular;  Laterality: N/A;  . ESOPHAGOGASTRODUODENOSCOPY (EGD) WITH PROPOFOL N/A 09/14/2014   Procedure: ESOPHAGOGASTRODUODENOSCOPY (EGD) WITH PROPOFOL;  Surgeon: [name], MD;  Location: [facility];  Service: Endoscopy;  Laterality: N/A;  . INCISION AND DRAINAGE HIP Left 01/16/2017   Procedure: IRRIGATION AND DEBRIDEMENT LEFT HIP;  Surgeon: [name], MD;  Location: [facility];  Service: Orthopedics;  Laterality: Left;  . INTRAMEDULLARY (IM) NAIL INTERTROCHANTERIC Left 11/29/2016   Procedure: INTRAMEDULLARY (IM) NAIL INTERTROCHANTRIC;  Surgeon: [name], MD;  Location: [place];  Service: Orthopedics;  Laterality: Left;  . OVARY SURGERY    . SHOULDER SURGERY Left   . TONSILLECTOMY  age 26  . VIDEO BRONCHOSCOPY Bilateral 05/31/2013   Procedure: VIDEO BRONCHOSCOPY WITHOUT FLUORO;  Surgeon: Brand Males, MD;  Location: [place];  Service: Cardiopulmonary;  Laterality: Bilateral;  . video bronscoscopy  2000   lung   Past Medical History:  Diagnosis Date  . Acute on chronic diastolic (congestive) heart failure ([place])   . Anemia     iron deficiency anemia - secondary to blood loss ( chronic)   . Arthritis    endstage changes bilateral knees/bilateral ankles.   . Asthma   . Carotid artery occlusion   . Chronic fatigue   . Chronic kidney disease   . Closed left hip fracture ([place])   . Clotting disorder ([place])    pt denies this  . Contusion of left knee    due to fall 1/14.  [name] COPD (chronic obstructive pulmonary disease) (HCC)    pulmonary fibrosis  . Depression, reactive   . Diabetes mellitus    type II   . Diastolic dysfunction   . Difficulty in walking   . Family history of heart disease   .  Generalized muscle weakness   . Gout   . High cholesterol   . History of falling   . Hypertension   . Hypothyroidism   . Interstitial lung disease ([place])   . 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    . Morbid obesity ([place])   . Morbid obesity ([place])   . Neuromuscular disorder ([place])    diabetic neuropathy   . Normal coronary arteries    cardiac catheterization performed  10/31/14  . RA (rheumatoid arthritis) ([place])    has been off methotreaxte since 10/13.  [name] Rheumatoid arthritis ([place])   . Shortness of breath   . Spinal stenosis of lumbar region   . Thyroid disease   . Unspecified lack of coordination   . URI (upper respiratory infection)    BP (!) 83/48   Pulse (!) 108   Ht _0  (1.575 m)   Wt 270 lb (122.5 kg)   SpO2 92%   BMI 49.38 kg/m   Opioid Risk Score:   Fall Risk Score:  `1  Depression screen PHQ 2/9  Depression screen [facility] 2/9 05/26/2018 12/09/2017 10/22/2017 09/22/2017 07/28/2017 06/30/2017 06/03/2017  Decreased Interest 1 1 0 0 0 0 0  Down, Depressed, Hopeless 2 1 0 0 0 0 0  PHQ - 2 Score 3 2 0 0 0 0 0  Altered sleeping 2 - - - - - -  Tired, decreased energy 3 - - - - - -  Change in appetite 1 - - - - - -  Feeling bad or failure about yourself  2 - - - - - -  Trouble concentrating 0 - - - - - -  Moving  slowly or fidgety/restless 2 - - - - - -  Suicidal thoughts 0 - - - - - -  PHQ-9 Score 13 - - - - - -  Difficult doing work/chores Somewhat difficult - - - - - -  Some recent data might be hidden     Review of Systems  Constitutional: Negative.   HENT: Negative.   Eyes: Negative.   Respiratory: Negative.   Cardiovascular: Negative.   Gastrointestinal: Negative.   Endocrine: Negative.   Genitourinary: Positive for difficulty urinating.  Musculoskeletal: Positive for arthralgias, back pain, gait problem, joint swelling and myalgias.  Skin: Negative.   Allergic/Immunologic: Negative.   Neurological: Positive for tremors, weakness and numbness.  Hematological: Negative.   Psychiatric/Behavioral: Negative.   All other systems reviewed and are negative.      Objective:   Physical Exam Vitals signs and nursing note reviewed.  Constitutional:      Appearance: Normal appearance.  Neck:     Musculoskeletal: Normal range of motion and neck supple.     Comments: Cervical Paraspinal Tenderness: C-5-C-6 Cardiovascular:     Rate and Rhythm: Normal rate and regular rhythm.     Pulses: Normal pulses.     Heart sounds: Normal heart sounds.  Pulmonary:     Effort: Pulmonary effort is normal.     Breath sounds: Normal breath sounds.  Musculoskeletal:     Comments: Normal Muscle Bulk and Muscle Testing Reveals:  Upper Extremities: Full ROM and Muscle Strength 5/5 Bilateral AC Joint Tenderness  Thoracic and Lumbar Hypersensitivity Bilateral Greater Trochanter Tenderness Lower Extremities: Decreased ROM and Muscle Strength  4/5  Bilateral Lower Extremities Flexion Produces Pain into Bilateral Patella's Arrived in wheelchair   Skin:  General: Skin is warm and dry.  Neurological:     Mental Status: She is alert and oriented to person, place, and time.  Psychiatric:        Mood and Affect: Mood normal.        Behavior: Behavior normal.           Assessment & Plan:  1.  Syncope and Collapse: Refuses EMS. Daughter drove [name] to [facility] Emergency Room for Evaluation.  2. Lumbar spinal stenosis with neurogenic claudication. Associated facet arthropathy/ Lumbar Radiculitis: Continuecurrent medication regime withGabapentinand HEP as tolerated. 06/02/2018 : Continue with slow weaning: MS Contin 15 mg one tablet every 12 hours #50and continueMSIR 15 one tablet twice a dayas needed ( 6 am and 1:00 pm)#50.. We will continue the opioid monitoring program, this consists of regular clinic visits, examinations, urine drug screen, pill counts as well as use of [place] Controlled Substance reporting System. 3. Depression: Continuecurrent medication Regime:Cymbalta. 06/02/2018 4. Diabetes mellitus type 2 with polyneuropathy: Continuecurrent medication regime:Gabapentin.01/15/ 2020. 5. Rheumatoid arthritis and osteoarthritis. Continuecurrent medication Regime.Voltaren Gel. Rheumatology Following. 06/02/2018 6. Interstitial lung disease: Pulmonology Following.06/02/2018 7. Bilateral Osteoarthritis Knee's: Continuecurrent medication regimen.Voltaren Gel.06/02/2018 8. Muscle Spasm: Continuecurrent medication regime: Flexeril.06/02/2018 9.BilateralGreater Trochanteric Tenderness:R>L. Continuecurrent treatmentwith Ice/Heat Therapy.06/02/2018. 10.Polyarthralgia: Rheumatology Following: Continue to monitor.06/02/2018 10. Morbid Obesitity: Continue Healthy Diet Regime and HEP. Continue to monitor.06/02/2018   20 minutes of face to face patient care time was spent during this visit. All questions were encouraged and answered.   F/U in 1 month

## 2018-06-02 NOTE — ED Notes (Signed)
ED TO INPATIENT HANDOFF REPORT  Name/Age/Gender [name] 78 y.o. female  Code Status    Code Status Orders  (From admission, onward)         Start     Ordered   06/02/18 1700  Full code  Continuous     06/02/18 1701        Code Status History    Date Active Date Inactive Code Status Order ID Comments User Context   06/02/2018 1702 06/02/2018 1702 Full Code [id number]  [name], MD ED   01/16/2017 1728 01/21/2017 1450 Full Code [id number]  [name], MD Inpatient   11/29/2016 1050 12/03/2016 0024 Full Code [id number]  Rondel Jumbo, PA-C ED   04/17/2015 2028 04/22/2015 1833 Full Code [id number]  Etta Quill, DO ED   10/31/2014 1136 11/02/2014 2125 Full Code [id number]  [name], MD Inpatient   10/30/2014 1632 10/31/2014 1136 Full Code [id number]  Isaiah Serge, NP Inpatient   07/15/2012 1146 07/24/2012 1516 Full Code [id number]  Bary Leriche, [place] Inpatient   07/12/2012 2141 07/15/2012 1146 Full Code [id number]  Derrill Kay, MD Inpatient   01/28/2012 2007 01/30/2012 2221 Full Code [id number]  [place], Davonna Belling, RN ED      Home/SNF/Other Home  Chief Complaint cp/syncope/low blood pressure  Level of Care/Admitting Diagnosis ED Disposition    ED Disposition Condition [place]: [place] [[id number]]  Level of Care: Cardiac Telemetry [103]  I expect the patient will be discharged within 24 hours: Yes  LOW acuity---Tx typically complete <24 hrs---ACUTE conditions typically can be evaluated <24 hours---LABS likely to return to acceptable levels <24 hours---IS near functional baseline---EXPECTED to return to current living arrangement---NOT newly hypoxic: Meets criteria for 5C-Observation unit  Diagnosis: Chest pain [244628]  Admitting Physician: [name] [[id number]]  Attending Physician: [name] [[id number]]  PT Class (Do Not Modify): Observation [104]  PT Acc Code (Do Not Modify): Observation  [10022]       Medical History Past Medical History:  Diagnosis Date  . Acute on chronic diastolic (congestive) heart failure ([place])   . Anemia    iron deficiency anemia - secondary to blood loss ( chronic)   . Arthritis    endstage changes bilateral knees/bilateral ankles.   . Asthma   . Carotid artery occlusion   . Chronic fatigue   . Chronic kidney disease   . Closed left hip fracture ([place])   . Clotting disorder ([place])    pt denies this  . Contusion of left knee    due to fall 1/14.  [name] COPD (chronic obstructive pulmonary disease) (HCC)    pulmonary fibrosis  . Depression, reactive   . Diabetes mellitus    type II   . Diastolic dysfunction   . Difficulty in walking   . Family history of heart disease   . Generalized muscle weakness   . Gout   . High cholesterol   . History of falling   . Hypertension   . Hypothyroidism   . Interstitial lung disease ([place])   . 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    . Morbid obesity ([place])   . Morbid obesity ([place])   . Neuromuscular disorder ([place])    diabetic neuropathy   . Normal coronary arteries    cardiac catheterization performed  10/31/14  . RA (rheumatoid arthritis) (  [place])    has been off methotreaxte since 10/13.  [name] Rheumatoid arthritis ([place])   . Shortness of breath   . Spinal stenosis of lumbar region   . Thyroid disease   . Unspecified lack of coordination   . URI (upper respiratory infection)     Allergies Allergies  Allergen Reactions  . Codeine Swelling and Other (See Comments)    Facial swelling Chest pain and swelling in legs  Facial swelling  . Infliximab Anaphylaxis    "sent me into shock" "sent me into shock"  . Lisinopril Swelling    Face and neck swelling Face and neck swelling    IV Location/Drains/Wounds Patient Lines/Drains/Airways Status   Active Line/Drains/Airways    Name:   Placement date:    Placement time:   Site:   Days:   Peripheral IV 06/02/18 Right Antecubital   06/02/18    1657    Antecubital   less than 1   Incision (Closed) 01/16/17 Hip Left   01/16/17    1535     502   Wound 09/15/11 Other (Comment) Right;Lower 2CM OPEN WOUND WITH MODERATE PUS DRAINAGE   09/15/11    1459    -   2452          Labs/Imaging Results for orders placed or performed during the hospital encounter of 06/02/18 (from the past 48 hour(s))  Basic metabolic panel     Status: Abnormal   Collection Time: 06/02/18  1:06 PM  Result Value Ref Range   Sodium 138 135 - 145 mmol/L   Potassium 3.6 3.5 - 5.1 mmol/L   Chloride 97 (L) 98 - 111 mmol/L   CO2 29 22 - 32 mmol/L   Glucose, Bld 204 (H) 70 - 99 mg/dL   BUN 22 8 - 23 mg/dL   Creatinine, Ser 1.43 (H) 0.44 - 1.00 mg/dL   Calcium 8.6 (L) 8.9 - 10.3 mg/dL   GFR calc non Af Amer 35 (L) >60 mL/min   GFR calc Af Amer 41 (L) >60 mL/min   Anion gap 12 5 - 15    Comment: Performed at [facility], [address], [place], [place] [address]  CBC     Status: None   Collection Time: 06/02/18  1:06 PM  Result Value Ref Range   WBC 5.7 4.0 - 10.5 K/uL   RBC 4.07 3.87 - 5.11 MIL/uL   Hemoglobin 12.2 12.0 - 15.0 g/dL   HCT 40.6 36.0 - 46.0 %   MCV 99.8 80.0 - 100.0 fL   MCH 30.0 26.0 - 34.0 pg   MCHC 30.0 30.0 - 36.0 g/dL   RDW 15.2 11.5 - 15.5 %   Platelets 167 150 - 400 K/uL   nRBC 0.0 0.0 - 0.2 %    Comment: Performed at [facility], [place] [address], [place], [place] [address]  I-stat troponin, ED     Status: None   Collection Time: 06/02/18  1:23 PM  Result Value Ref Range   Troponin i, poc 0.01 0.00 - 0.08 ng/mL   Comment 3            Comment: Due to the release kinetics of cTnI, a negative result within the first hours of the onset of symptoms does not rule out myocardial infarction with certainty. If myocardial infarction is still suspected, repeat the test at appropriate intervals.   Brain natriuretic peptide     Status:  None   Collection Time: 06/02/18  3:46 PM  Result Value Ref Range   B Natriuretic Peptide 7.9 0.0 - 100.0 pg/mL    Comment: Performed at [facility], [place] [address], [place], [place] [address]   Dg Chest 2 View  Result Date: 06/02/2018 CLINICAL DATA:  Chest pain and pressure EXAM: CHEST - 2 VIEW COMPARISON:  11/29/2016 FINDINGS: Cardiac shadows within normal limits. Lungs are well aerated bilaterally without focal infiltrate or sizable effusion. No acute bony abnormality is noted. Chronic degenerative changes about the right shoulder joint are seen. IMPRESSION: No acute abnormality noted. Electronically Signed   By: Inez Catalina M.D.   On: 06/02/2018 13:32    Pending Labs Unresulted Labs (From admission, onward)    Start     Ordered   06/03/18 6568  Basic metabolic panel  Tomorrow morning,   R     06/02/18 1701   06/03/18 0500  CBC  Tomorrow morning,   R     06/02/18 1701   06/02/18 1618  Urinalysis, Routine w reflex microscopic  ONCE - STAT,   R     06/02/18 1617          Vitals/Pain Today's Vitals   06/02/18 1615 06/02/18 1700 06/02/18 1709 06/02/18 1730  BP: 117/74 124/69  118/78  Pulse: 88 93 89 88  Resp: _0 Temp:      TempSrc:      SpO2: 97% 94% 97% 94%  Weight:      Height:      PainSc:        Isolation Precautions No active isolations  Medications Medications  sodium chloride 0.9 % bolus 500 mL (500 mLs Intravenous New Bag/Given 06/02/18 1657)  atorvastatin (LIPITOR) tablet 10 mg (has no administration in time range)  acetaminophen (TYLENOL) tablet 1,000 mg (has no administration in time range)  multivitamin with minerals tablet 1 tablet (has no administration in time range)  Calcium + D3 600-200 MG-UNIT TABS (has no administration in time range)  albuterol (PROVENTIL) (2.5 MG/3ML) 0.083% nebulizer solution 3 mL (has no administration in time range)  cholecalciferol (VITAMIN D) tablet 2,000 Units (has no administration in time range)  sodium  chloride (OCEAN) 0.65 % nasal spray 1 spray (has no administration in time range)  guaiFENesin (MUCINEX) 12 hr tablet 1,200 mg (has no administration in time range)  predniSONE (DELTASONE) tablet 12.5 mg (has no administration in time range)  azithromycin (ZITHROMAX) tablet 500 mg (has no administration in time range)  hydroxychloroquine (PLAQUENIL) tablet 200 mg (has no administration in time range)  furosemide (LASIX) tablet 80 mg (has no administration in time range)  docusate sodium (COLACE) capsule 100 mg (has no administration in time range)  potassium chloride (K-DUR,KLOR-CON) CR tablet 10 mEq (has no administration in time range)  ipratropium-albuterol (DUONEB) 0.5-2.5 (3) MG/3ML nebulizer solution 3 mL (has no administration in time range)  aspirin tablet 325 mg (has no administration in time range)  colchicine tablet 0.6 mg (has no administration in time range)  DULoxetine (CYMBALTA) DR capsule 60 mg (has no administration in time range)  cyclobenzaprine (FLEXERIL) tablet 5 mg (has no administration in time range)  allopurinol (ZYLOPRIM) tablet 300 mg (has no administration in time range)  morphine (MS CONTIN) 12 hr tablet 15 mg (has no administration in time range)  morphine (MSIR) tablet 15 mg (has no administration in time range)  levothyroxine (SYNTHROID, LEVOTHROID) tablet 50 mcg (has no administration in time range)  irbesartan (AVAPRO) tablet 150 mg (has  no administration in time range)  pantoprazole (PROTONIX) EC tablet 40 mg (has no administration in time range)  Semaglutide(0.25 or 0.5MG/DOS) SOPN 0.5 mg (has no administration in time range)  Tofacitinib Citrate TABS 5 mg (has no administration in time range)  fluticasone (FLONASE) 50 MCG/ACT nasal spray 2 spray (has no administration in time range)  metoprolol succinate (TOPROL-XL) 24 hr tablet 50 mg (has no administration in time range)  gabapentin (NEURONTIN) tablet 1,200 mg (has no administration in time range)   diclofenac sodium (VOLTAREN) 1 % transdermal gel 2 g (has no administration in time range)  ondansetron (ZOFRAN) injection 4 mg (has no administration in time range)  insulin aspart (novoLOG) injection 0-15 Units (has no administration in time range)  insulin aspart (novoLOG) injection 0-5 Units (has no administration in time range)  enoxaparin (LOVENOX) injection 30 mg (has no administration in time range)    Mobility walks

## 2018-06-03 ENCOUNTER — Observation Stay (HOSPITAL_COMMUNITY): Payer: Medicare Other

## 2018-06-03 ENCOUNTER — Encounter: Payer: Medicare Other | Admitting: Registered Nurse

## 2018-06-03 ENCOUNTER — Observation Stay (HOSPITAL_BASED_OUTPATIENT_CLINIC_OR_DEPARTMENT_OTHER): Payer: Medicare Other

## 2018-06-03 DIAGNOSIS — I82442 Acute embolism and thrombosis of left tibial vein: Secondary | ICD-10-CM | POA: Diagnosis not present

## 2018-06-03 DIAGNOSIS — R079 Chest pain, unspecified: Secondary | ICD-10-CM

## 2018-06-03 DIAGNOSIS — I82409 Acute embolism and thrombosis of unspecified deep veins of unspecified lower extremity: Secondary | ICD-10-CM

## 2018-06-03 DIAGNOSIS — E039 Hypothyroidism, unspecified: Secondary | ICD-10-CM | POA: Diagnosis not present

## 2018-06-03 DIAGNOSIS — R0789 Other chest pain: Secondary | ICD-10-CM | POA: Diagnosis not present

## 2018-06-03 DIAGNOSIS — J849 Interstitial pulmonary disease, unspecified: Secondary | ICD-10-CM | POA: Diagnosis not present

## 2018-06-03 DIAGNOSIS — I5032 Chronic diastolic (congestive) heart failure: Secondary | ICD-10-CM | POA: Diagnosis not present

## 2018-06-03 DIAGNOSIS — E1122 Type 2 diabetes mellitus with diabetic chronic kidney disease: Secondary | ICD-10-CM | POA: Diagnosis not present

## 2018-06-03 DIAGNOSIS — R55 Syncope and collapse: Secondary | ICD-10-CM

## 2018-06-03 DIAGNOSIS — I130 Hypertensive heart and chronic kidney disease with heart failure and stage 1 through stage 4 chronic kidney disease, or unspecified chronic kidney disease: Secondary | ICD-10-CM | POA: Diagnosis not present

## 2018-06-03 DIAGNOSIS — F329 Major depressive disorder, single episode, unspecified: Secondary | ICD-10-CM | POA: Diagnosis not present

## 2018-06-03 DIAGNOSIS — N179 Acute kidney failure, unspecified: Secondary | ICD-10-CM | POA: Diagnosis not present

## 2018-06-03 DIAGNOSIS — J9611 Chronic respiratory failure with hypoxia: Secondary | ICD-10-CM | POA: Diagnosis not present

## 2018-06-03 DIAGNOSIS — E1140 Type 2 diabetes mellitus with diabetic neuropathy, unspecified: Secondary | ICD-10-CM | POA: Diagnosis not present

## 2018-06-03 DIAGNOSIS — E7800 Pure hypercholesterolemia, unspecified: Secondary | ICD-10-CM | POA: Diagnosis not present

## 2018-06-03 DIAGNOSIS — N183 Chronic kidney disease, stage 3 (moderate): Secondary | ICD-10-CM | POA: Diagnosis not present

## 2018-06-03 LAB — GLUCOSE, CAPILLARY
Glucose-Capillary: 107 mg/dL — ABNORMAL HIGH (ref 70–99)
Glucose-Capillary: 88 mg/dL (ref 70–99)
Glucose-Capillary: 119 mg/dL — ABNORMAL HIGH (ref 70–99)
Glucose-Capillary: 142 mg/dL — ABNORMAL HIGH (ref 70–99)

## 2018-06-03 LAB — BASIC METABOLIC PANEL
Anion gap: 10 (ref 5–15)
BUN: 18 mg/dL (ref 8–23)
CO2: 29 mmol/L (ref 22–32)
Calcium: 8.3 mg/dL — ABNORMAL LOW (ref 8.9–10.3)
Chloride: 104 mmol/L (ref 98–111)
Creatinine, Ser: 1.18 mg/dL — ABNORMAL HIGH (ref 0.44–1.00)
GFR calc Af Amer: 52 mL/min — ABNORMAL LOW (ref >60)
GFR calc non Af Amer: 45 mL/min — ABNORMAL LOW (ref >60)
Glucose, Bld: 104 mg/dL — ABNORMAL HIGH (ref 70–99)
Potassium: 3 mmol/L — ABNORMAL LOW (ref 3.5–5.1)
Sodium: 143 mmol/L (ref 135–145)

## 2018-06-03 LAB — CBC
HCT: 35 % — ABNORMAL LOW (ref 36.0–46.0)
Hemoglobin: 11.2 g/dL — ABNORMAL LOW (ref 12.0–15.0)
MCH: 31.3 pg (ref 26.0–34.0)
MCHC: 32 g/dL (ref 30.0–36.0)
MCV: 97.8 fL (ref 80.0–100.0)
Platelets: 157 10*3/uL (ref 150–400)
RBC: 3.58 MIL/uL — ABNORMAL LOW (ref 3.87–5.11)
RDW: 15.2 % (ref 11.5–15.5)
WBC: 5.1 10*3/uL (ref 4.0–10.5)
nRBC: 0 % (ref 0.0–0.2)

## 2018-06-03 LAB — TROPONIN I: Troponin I: <0.03 ng/mL (ref <0.03)

## 2018-06-03 LAB — MAGNESIUM: MAGNESIUM: 1.8 mg/dL (ref 1.7–2.4)

## 2018-06-03 MED ORDER — ENOXAPARIN SODIUM 40 MG/0.4ML ~~LOC~~ SOLN: 40.0000 mg | SUBCUTANEOUS | Status: DC

## 2018-06-03 MED ORDER — HEPARIN (PORCINE) 25000 UT/250ML-% IV SOLN
1000.0000 [IU]/h | INTRAVENOUS | Status: AC
  Administered 2018-06-03: 1300 [IU]/h via INTRAVENOUS
  Administered 2018-06-04: 1150 [IU]/h via INTRAVENOUS
  Filled 2018-06-03 (×2): qty 250

## 2018-06-03 MED ORDER — POTASSIUM CHLORIDE CRYS ER 20 MEQ PO TBCR
40.0000 meq | EXTENDED_RELEASE_TABLET | Freq: Once | ORAL | Status: AC
  Administered 2018-06-03: 40 meq via ORAL
  Filled 2018-06-03: qty 2

## 2018-06-03 MED ORDER — HEPARIN BOLUS VIA INFUSION
4000.0000 [IU] | Freq: Once | INTRAVENOUS | Status: AC
  Administered 2018-06-03: 4000 [IU] via INTRAVENOUS
  Filled 2018-06-03: qty 4000

## 2018-06-03 NOTE — Progress Notes (Addendum)
Cardiology Consult    Patient ID: ALAZIA CROCKET MRN: [id number], DOB/AGE: [birth year]   Admit date: 06/02/2018 Date of Consult: 06/03/2018  Primary Physician: Glendale Chard, MD. Primary Cardiologist: Previously seen by Dr. Gwenlyn Found in 2017. Requesting Provider: Eulogio Bear, DO.  Patient Profile    [name] is a 78 y.o. female with a history of hypertension, hyperlipidemia, diabetes mellitus, diastolic dysfunction, hypothyroidism, interstitial lung disease on home O2, COPD, iron deficiency anemia, RA, CKD, who is being seen today for the evaluation of chest pain and syncope at the request of Dr. Eliseo Squires.  History of Present Illness    [name] is a 78 year old female with the above history who was previously seen by Dr. Gwenlyn Found. Patient underwent right/left heart catheterization in 10/2014 which showed mild CAD with no significant obstructive disease and mild pulmonary hypertension. Cardiac output was 7.6 L and cardiac index was 3.3 at that time. She was referred to [facility] for further evaluation and management of her interstitial lung disease. Patient last saw Dr. Gwenlyn Found in 12/2015 at which time she reported that her shortness of breath had improved some since starting an antibiotic and inhaled bronchodilator. Patient was advised to follow-up on an as needed basis. Patient has not been seen since that time.  Patient presented to [facility] ED yesterday from "pain office" after having a syncopal episode.  Patient reports daughter was helping her get out of the car and into a wheelchair when all of a sudden she started feeling lightheaded and then the next thing she remembers is waking up with office staff members around her. Patient's daughter is not currently present but patient reports that her daughter said she was unconscious for around 5 minutes and had some left arm shaking during this.  Patient had no bowel or bladder incontinence and no tongue biting.  Blood pressure was checked at  this outpatient office and systolic blood pressure was in the 80s.  Patient was advised to go to the emergency department for further evaluation. She also reports intermittent substernal/right-sided chest pain that radiates to her back for the last 2 to 3 weeks. She describes the pain as a pressure/ache and ranks it as a 6-7/10 on the pain scale at its worse. Pain occurs at rest and has become more frequent over the last few weeks and is now occurring on a daily basis.  No associated shortness of breath (worse from baseline), nausea, vomiting, palpitations. Patient has some chronic shortness of breath due to her interstitial lung disease and COPD; however, she has not had any pulmonary issues recently. She saw her pulmonologist earlier this week and was told she was doing well from a pulmonary standpoint. Patient did report increased lower extremity and abdominal swelling at that time, so pulmonologist suggested following up with Cardiology. She does report worsening lower extremity swelling over the last 2-3 weeks with stable orthopnea (sleeps on 2-3 pillows) and no PND. No recent fevers or illnesses.   In the ED, initially hypotensive with BP of 86/49 with good response with IV fluids. EKG showed no acute ischemic changes. Troponin negative x3. Chest x-ray showed no acute findings. CBC unremarkable. Na 138, K 3.6, Glucose 204, SCr 1.43. Patient was admitted for further evaluation.   At the time of this evaluation, patient is resting comfortably but states she continues to have some chest discomfort that she ranks as a 4/10.   Past Medical History   Past Medical History:  Diagnosis Date  . Acute  on chronic diastolic (congestive) heart failure ([place])   . Anemia    iron deficiency anemia - secondary to blood loss ( chronic)   . Arthritis    endstage changes bilateral knees/bilateral ankles.   . Asthma   . Carotid artery occlusion   . Chronic fatigue   . Chronic kidney disease   . Closed left hip  fracture ([place])   . Clotting disorder ([place])    pt denies this  . Contusion of left knee    due to fall 1/14.  [name] COPD (chronic obstructive pulmonary disease) (HCC)    pulmonary fibrosis  . Depression, reactive   . Diabetes mellitus    type II   . Diastolic dysfunction   . Difficulty in walking   . Family history of heart disease   . Generalized muscle weakness   . Gout   . High cholesterol   . History of falling   . Hypertension   . Hypothyroidism   . Interstitial lung disease ([place])   . 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    . Morbid obesity ([place])   . Morbid obesity ([place])   . Neuromuscular disorder ([place])    diabetic neuropathy   . Normal coronary arteries    cardiac catheterization performed  10/31/14  . RA (rheumatoid arthritis) ([place])    has been off methotreaxte since 10/13.  [name] Rheumatoid arthritis ([place])   . Shortness of breath   . Spinal stenosis of lumbar region   . Thyroid disease   . Unspecified lack of coordination   . URI (upper respiratory infection)     Past Surgical History:  Procedure Laterality Date  . ABDOMINAL HYSTERECTOMY    . BRAIN SURGERY     Gamma knife 10/13. Needs repeat spring  '14  . CARDIAC CATHETERIZATION N/A 10/31/2014   Procedure: Right/Left Heart Cath and Coronary Angiography;  Surgeon: [name], MD;  Location: [facility];  Service: Cardiovascular;  Laterality: N/A;  . ESOPHAGOGASTRODUODENOSCOPY (EGD) WITH PROPOFOL N/A 09/14/2014   Procedure: ESOPHAGOGASTRODUODENOSCOPY (EGD) WITH PROPOFOL;  Surgeon: [name], MD;  Location: [facility];  Service: Endoscopy;  Laterality: N/A;  . INCISION AND DRAINAGE HIP Left 01/16/2017   Procedure: IRRIGATION AND DEBRIDEMENT LEFT HIP;  Surgeon: [name], MD;  Location: [facility];  Service: Orthopedics;  Laterality: Left;  . INTRAMEDULLARY (IM) NAIL INTERTROCHANTERIC Left 11/29/2016    Procedure: INTRAMEDULLARY (IM) NAIL INTERTROCHANTRIC;  Surgeon: [name], MD;  Location: [facility];  Service: Orthopedics;  Laterality: Left;  . OVARY SURGERY    . SHOULDER SURGERY Left   . TONSILLECTOMY  age 89  . VIDEO BRONCHOSCOPY Bilateral 05/31/2013   Procedure: VIDEO BRONCHOSCOPY WITHOUT FLUORO;  Surgeon: Brand Males, MD;  Location: [facility];  Service: Cardiopulmonary;  Laterality: Bilateral;  . video bronscoscopy  2000   lung     Allergies  Allergies  Allergen Reactions  . Codeine Swelling and Other (See Comments)    Facial swelling Chest pain and swelling in legs  Facial swelling  . Infliximab Anaphylaxis    "sent me into shock" "sent me into shock"  . Lisinopril Swelling    Face and neck swelling Face and neck swelling    Inpatient Medications    . allopurinol  300 mg Oral Daily  . aspirin  325 mg Oral Daily  . atorvastatin  10 mg Oral QPM  . [START ON  06/04/2018] azithromycin  500 mg Oral Q M,W,F  . calcium-vitamin D  1 tablet Oral Q lunch  . cholecalciferol  2,000 Units Oral Q lunch  . colchicine  0.6 mg Oral Daily  . diclofenac sodium  2 g Topical TID  . docusate sodium  100 mg Oral BID  . DULoxetine  60 mg Oral Daily  . enoxaparin (LOVENOX) injection  40 mg Subcutaneous Q24H  . fluticasone  2 spray Each Nare Daily  . gabapentin  1,200 mg Oral TID  . guaiFENesin  1,200 mg Oral BID  . hydroxychloroquine  200 mg Oral Daily  . insulin aspart  0-15 Units Subcutaneous TID WC  . insulin aspart  0-5 Units Subcutaneous QHS  . irbesartan  150 mg Oral Daily  . levothyroxine  50 mcg Oral QAC breakfast  . metoprolol succinate  50 mg Oral QPC breakfast  . morphine  15 mg Oral Q12H  . multivitamin with minerals  1 tablet Oral Daily  . pantoprazole  40 mg Oral QAC breakfast  . predniSONE  12.5 mg Oral q morning - 10a  . [START ON 06/10/2018] Semaglutide(0.25 or 0.5MG/DOS)  0.5 mg Subcutaneous Weekly  . Tofacitinib Citrate  5 mg Oral BID    Family  History    Family History  Problem Relation Age of Onset  . Diabetes Mother   . Heart attack Mother   . Hypertension Father   . Lung cancer Father   . Diabetes Sister   . Diabetes Brother   . Hypertension Brother   . Heart disease Brother   . Heart attack Brother   . Kidney cancer Brother   . Uterine cancer Daughter   . Breast cancer Sister   . Rheum arthritis Maternal Uncle   . Gout Brother   . Kidney failure Brother        x 5   She indicated that her mother is deceased. She indicated that her father is deceased. She indicated that only one of her two sisters is alive. She indicated that only one of her five brothers is alive. She indicated that the status of her daughter is unknown. She indicated that the status of her maternal uncle is unknown.   Social History    Social History   Socioeconomic History  . Marital status: Married    Spouse name: Not on file  . Number of children: 6  . Years of education: college  . Highest education level: Not on file  Occupational History  . Occupation: retired Ship broker  . Financial resource strain: Not on file  . Food insecurity:    Worry: Not on file    Inability: Not on file  . Transportation needs:    Medical: Not on file    Non-medical: Not on file  Tobacco Use  . Smoking status: Never Smoker  . Smokeless tobacco: Never Used  Substance and Sexual Activity  . Alcohol use: No  . Drug use: No  . Sexual activity: Not Currently  Lifestyle  . Physical activity:    Days per week: Not on file    Minutes per session: Not on file  . Stress: Not on file  Relationships  . Social connections:    Talks on phone: Not on file    Gets together: Not on file    Attends religious service: Not on file    Active member of club or organization: Not on file    Attends meetings of clubs or organizations: Not  on file    Relationship status: Not on file  . Intimate partner violence:    Fear of current or ex partner: Not on  file    Emotionally abused: Not on file    Physically abused: Not on file    Forced sexual activity: Not on file  Other Topics Concern  . Not on file  Social History Narrative   Patient consumes 2-3 cups coffee per day.     Review of Systems    Review of Systems  Constitutional: Positive for malaise/fatigue. Negative for chills and fever.  HENT: Negative for congestion and sore throat (stable).   Respiratory: Positive for shortness of breath. Negative for cough, hemoptysis and sputum production.   Cardiovascular: Positive for chest pain, orthopnea (stable) and leg swelling. Negative for palpitations and PND.  Gastrointestinal: Negative for blood in stool, nausea and vomiting.  Genitourinary: Negative for hematuria.  Musculoskeletal: Positive for back pain (chronic). Negative for myalgias.  Neurological: Positive for loss of consciousness and weakness.  Endo/Heme/Allergies: Does not bruise/bleed easily.  Psychiatric/Behavioral: Negative for substance abuse.  All other systems reviewed and are negative.  Physical Exam    Blood pressure 127/76, pulse 100, temperature 99 F (37.2 C), temperature source Oral, resp. rate 18, height _0  (1.575 m), weight 123.4 kg, SpO2 92 %.  General: 78 y.o. morbidly obese female resting comfortably in no acute distress. Pleasant and cooperative. HEENT: Normal  Neck: Supple. No carotid bruits appreciated. Difficult to assess JVD due to body habitus. Lungs: No increased work of breathing. Diminished breath sounds on the right but lungs clear to ascultation bilaterally. No wheezes, rhonchi, or rales. Heart: RRR. Distinct S1 and S2. No significant murmurs, gallops, or rubs.  Abdomen: Soft, obese, and non-tender to palpation. Bowel sounds present. Extremities: 1-2+ pitting edema of bilatearl lower extremitites. Radial pulses 2+ and equal bilaterally. Skin: Warm and dry. Area of chronic hyperpigmentation on left lower leg. Neuro: Alert and oriented x3. No  focal deficits. Moves all extremities spontaneously. Psych: Normal affect.  Labs    Troponin [facility] Test) Recent Labs    06/02/18 1323  TROPIPOC 0.01   Recent Labs    06/02/18 1829 06/02/18 2333  TROPONINI <0.03 <0.03   Lab Results  Component Value Date   WBC 5.1 06/03/2018   HGB 11.2 (L) 06/03/2018   HCT 35.0 (L) 06/03/2018   MCV 97.8 06/03/2018   PLT 157 06/03/2018    Recent Labs  Lab 06/03/18 0435  NA 143  K 3.0*  CL 104  CO2 29  BUN 18  CREATININE 1.18*  CALCIUM 8.3*  GLUCOSE 104*   Lab Results  Component Value Date   CHOL 150 05/26/2018   HDL 65 05/26/2018   LDLCALC 63 05/26/2018   TRIG 111 05/26/2018   Lab Results  Component Value Date   DDIMER 2.54 (H) 04/18/2015     Radiology Studies    Dg Chest 2 View  Result Date: 06/02/2018 CLINICAL DATA:  Chest pain and pressure EXAM: CHEST - 2 VIEW COMPARISON:  11/29/2016 FINDINGS: Cardiac shadows within normal limits. Lungs are well aerated bilaterally without focal infiltrate or sizable effusion. No acute bony abnormality is noted. Chronic degenerative changes about the right shoulder joint are seen. IMPRESSION: No acute abnormality noted. Electronically Signed   By: Inez Catalina M.D.   On: 06/02/2018 13:32    EKG     EKG: EKG was personally reviewed and demonstrates:   Telemetry: Telemetry was personally reviewed and demonstrates:  Sinus rhythm with heart rates in the 90's to 110's.  Cardiac Imaging    Echocardiogram 04/19/2015: Study Conclusions: - Left ventricle: The cavity size was normal. Wall thickness was   normal. Systolic function was normal. The estimated ejection   fraction was in the range of 55% to 60%. Wall motion was normal;   there were no regional wall motion abnormalities. Doppler   parameters are consistent with abnormal left ventricular   relaxation (grade 1 diastolic dysfunction). - Aortic valve: There was no stenosis. - Mitral valve: Mildly calcified annulus. Mildly  calcified leaflets   . There was no significant regurgitation. - Right ventricle: The cavity size was normal. Systolic function   was normal. - Pulmonary arteries: No complete TR doppler jet so unable to   estimate PA systolic pressure. - Systemic veins: IVC measured 2.2 cm with < 50% respirophasic   variation, suggesting RA pressure 15 mmHg.  Impressions: - Normal LV size with EF 55-60%. Normal RV size and systolic   function. No significant valvular abnormalities. Relatively   normal echo except IVC was dilated, suggesting elevated RV   filling pressure. _______________  Right/Left Heart Catheterization 10/31/2014:  Mild CAD without significant obstructive disease. Ectasia in all three major proximal vessels.  Mild pulmonary hypertension. Aortic saturation 87%. Pulmonary artery saturation 60%. Cardiac output 7.6 L. Cardiac index 3.3.   Continue medical therapy for lung disease. Continue preventative therapy for her heart disease.  Assessment & Plan    Chest Pain - Patient presents following a syncopal episode. She also reports worsening intermittent non-exertional chest pain for the last 2-3 weeks that is now occurring on a daily basis. Last cath in 2016 showed mild CAD with no significant obstructive disease.  - EKG showed no acute ischemic changes. - Troponin negative x3.  - BNP normal at 7.9. - Echo pending. - Additional work-up will likely depend on Echo results.   Syncope - Systolic BP initial in the 80s. Improved with IV fluids. Current BP 127/76. - Patient is hemodynamically stable. - Telemetry reveals sinus rhythm with no arrhythmias.  - Echo pending.  Hypertension - May consider holding home ARB at this time.  Otherwise, per primary team.  Signed, Darreld Mclean, PA-C 06/03/2018, 9:16 AM  For questions or updates, please contact   Please consult [email or web address] for contact info under Cardiology/STEMI.  Personally seen and examined. Agree with  above.   79 year old with known interstitial lung disease, chronic pain syndrome, who was transported to the hospital after syncopal episode related to significant hypotension which responded to IV fluids.  Takes Lasix 80 mg at home.  Currently pleasant, laying comfortable in bed no chest pain, no shortness of breath.  She does state however that she does have chronic body aching.  GEN: Well nourished, well developed, in no acute distress, obese HEENT: normal  Neck: no JVD, carotid bruits, or masses Cardiac: RRR; no murmurs, rubs, or gallops,no edema  Respiratory:  clear to auscultation bilaterally, normal work of breathing GI: soft, nontender, nondistended, + BS MS: no deformity or atrophy  Skin: warm and dry, no rash Neuro:  Alert and Oriented x 3, Strength and sensation are intact Psych: euthymic mood, full affect  EKG personally reviewed shows normal sinus rhythm without any ischemic changes.  No change from prior.  Prior cardiac catheterization 2016 showed minimal CAD with ectatic major proximal vessels.  Mild pulmonary hypertension.  Assessment and plan:  Syncope -Likely secondary to intravascular volume depletion.  Once her volume status was  restored with IV fluids, she responded promptly with elevated blood pressures.  I would continue to hold her Lasix 80 mg that she takes at home.  It is possible that she will need as a decreased dose or perhaps utilize daily weights to determine her Lasix needs in the future.  BNP was 7.  Telemetry unremarkable.  No adverse arrhythmias.  Atypical chest pain - She really did not describe any significant chest pain other than generalized body aches.  Troponin normal.  No further evaluation.  Large ectatic coronary arteries noted during catheterization in 16.  Hypertension essential - Also would consider holding the angiotensin receptor blocker at this time as well as Lasix.  She should follow-up with her primary physician to determine needs  for further antihypertensives.  Secondary pulmonary hypertension - Previously determined to be mild in 2016.  Is likely that this has progressed.  Interstitial lung disease as well as obesity contributing. -Echocardiogram pending.  I think if echocardiogram is overall reassuring, she can be discharged home.  Continue to follow with pulmonary and primary care.  Candee Furbish, MD

## 2018-06-03 NOTE — Progress Notes (Signed)
Progress Note    [name]  TUU:[id number] DOB: [birth year]  DOA: 06/02/2018 PCP: Glendale Chard, MD    Brief Narrative:    Medical records reviewed and are as summarized below:  [name] is an 78 y.o. female with medical history significant for type 2 diabetes, hypertension, hyperlipidemia, interstitial lung disease, arthritis, hypothyroidism, rheumatoid arthritis on long-term opioids, who presented to the ED today after a syncopal episode this morning at her primary care physician's office.  Patient's daughter states that she was helping her mom out of the car and she noticed that her mom's left arm started shaking just before her mom collapsed on her and although eyes were open, she was unresponsive for about 2 to 3 minutes.   Assessment/Plan:   Principal Problem:   Chest pain with moderate risk of acute coronary syndrome Active Problems:   Dyslipidemia   Thyroid disease   Essential hypertension   Rheumatoid arthritis ([place])   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 (HCC)   Spinal stenosis of lumbar region with radiculopathy   ILD (interstitial lung disease) (HCC)   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)   High risk medication use   Morbid obesity (HCC)   Chronic respiratory failure with hypoxia (HCC)   Acute kidney injury superimposed on chronic kidney disease (HCC)   Hypotension   Depression   GERD (gastroesophageal reflux disease)   Syncope and collapse   Chest pain   Acute lower UTI   DVT (deep venous thrombosis) (HCC)  Acute DVT with presumed PE -heparin gtt with plan to change to PO eliquis/xarelto -risk factors include obesity and sedentary lifestyle -will hold on CTA/V/Q scan for now as patient has some CKD  Syncope, with complaints of chest tightness for 2 weeks -tele (mildly tachy) -echo pending -? Orthostatic vs PE  AKI on CKD III -hold lasix for one day -BMP in AM  ? Acute lower UTI -rocephin  1g q24h; deescalate based on culture -very short course of 3 days  Diabetes mellitus, type II -hold oral meds -SSI, carb controlled diet  Hyperlipidemia -cont atorvastatin  Hypertension -cont Toprol XL, hold lasix for now  Hypothyroidism -cont home synthroid  Depression -cont home cymbalta  GERD -cont home protonix  Hypokalemia -replete  Rheumatoid arthritis -cont home prednisone, plaquenil, Tofacitinib -cont home MS Contin, MSIR, voltaren gel  Interstitial lung disease -cont home albuterol, nebs -cont home azithromycin qMWF  Diabetic neuropathy -cont home gabapentin   Morbid obesity Body mass index is 49.75 kg/m.   Family Communication/Anticipated D/C date and plan/Code Status   DVT prophylaxis: heparin Code Status: Full Code.  Family Communication: at bedside Disposition Plan: home in the AM?   Medical Consultants:    cards     Subjective:   Been having leg pains lately  Objective:    Vitals:   06/03/18 0500 06/03/18 0752 06/03/18 1119 06/03/18 1147  BP: 121/75 127/76 110/79 136/88  Pulse: 93 100 96 89  Resp: 18   20  Temp: 97.8 F (36.6 C) 99 F (37.2 C) 98.4 F (36.9 C) 98.2 F (36.8 C)  TempSrc: Oral Oral Oral Oral  SpO2: 97% 92% 91% 90%  Weight: 123.4 kg     Height:        Intake/Output Summary (Last 24 hours) at 06/03/2018 1522 Last data filed at 06/03/2018 0300 Gross per 24 hour  Intake 620 ml  Output 400 ml  Net 220 ml   [employer]  06/02/18 1522 06/02/18 1814 06/03/18 0500  Weight: 122.5 kg 122.5 kg 123.4 kg    Exam: In bed, NAD Obese female Pleasant and cooperative A+Ox3 +LE edema but not pitting, calf tender to palpation rrr Diminished breath sounds due to body habitus  Data Reviewed:   I have personally reviewed following labs and imaging studies:  Labs: Labs show the following:   Basic Metabolic Panel: Recent Labs  Lab 06/02/18 1306 06/03/18 0435  NA 138 143  K 3.6 3.0*  CL 97*  104  CO2 29 29  GLUCOSE 204* 104*  BUN 22 18  CREATININE 1.43* 1.18*  CALCIUM 8.6* 8.3*   GFR Estimated Creatinine Clearance: 50.8 mL/min (A) (by C-G formula based on SCr of 1.18 mg/dL (H)). Liver Function Tests: No results for input(s): AST, ALT, ALKPHOS, BILITOT, PROT, ALBUMIN in the last 168 hours. No results for input(s): LIPASE, AMYLASE in the last 168 hours. No results for input(s): AMMONIA in the last 168 hours. Coagulation profile No results for input(s): INR, PROTIME in the last 168 hours.  CBC: Recent Labs  Lab 06/02/18 1306 06/03/18 0435  WBC 5.7 5.1  HGB 12.2 11.2*  HCT 40.6 35.0*  MCV 99.8 97.8  PLT 167 157   Cardiac Enzymes: Recent Labs  Lab 06/02/18 1829 06/02/18 2333  TROPONINI <0.03 <0.03   BNP (last 3 results) No results for input(s): PROBNP in the last 8760 hours. CBG: Recent Labs  Lab 06/02/18 1811 06/02/18 2120 06/03/18 0647 06/03/18 1114  GLUCAP 107* 147* 88 119*   D-Dimer: No results for input(s): DDIMER in the last 72 hours. Hgb A1c: No results for input(s): HGBA1C in the last 72 hours. Lipid Profile: No results for input(s): CHOL, HDL, LDLCALC, TRIG, CHOLHDL, LDLDIRECT in the last 72 hours. Thyroid function studies: No results for input(s): TSH, T4TOTAL, T3FREE, THYROIDAB in the last 72 hours.  Invalid input(s): FREET3 Anemia work up: No results for input(s): VITAMINB12, FOLATE, FERRITIN, TIBC, IRON, RETICCTPCT in the last 72 hours. Sepsis Labs: Recent Labs  Lab 06/02/18 1306 06/03/18 0435  WBC 5.7 5.1    Microbiology No results found for this or any previous visit (from the past 240 hour(s)).  Procedures and diagnostic studies:  Dg Chest 2 View  Result Date: 06/02/2018 CLINICAL DATA:  Chest pain and pressure EXAM: CHEST - 2 VIEW COMPARISON:  11/29/2016 FINDINGS: Cardiac shadows within normal limits. Lungs are well aerated bilaterally without focal infiltrate or sizable effusion. No acute bony abnormality is noted.  Chronic degenerative changes about the right shoulder joint are seen. IMPRESSION: No acute abnormality noted. Electronically Signed   By: Inez Catalina M.D.   On: 06/02/2018 13:32   Vas [place] Lower Extremity Venous (dvt)  Result Date: 06/03/2018  Lower Venous Study Indications: Pain, Swelling, Edema, and R/o DVT.  Limitations: Body habitus. Comparison Study: Venous study on 04/18/2015 with negative result. Performing Technologist: Rudell Cobb  Examination Guidelines: A complete evaluation includes B-mode imaging, spectral Doppler, color Doppler, and power Doppler as needed of all accessible portions of each vessel. Bilateral testing is considered an integral part of a complete examination. Limited examinations for reoccurring indications may be performed as noted.  Right Venous Findings: +---------+---------------+---------+-----------+----------+-------------------+          CompressibilityPhasicitySpontaneityPropertiesSummary             +---------+---------------+---------+-----------+----------+-------------------+ CFV      Full           Yes      Yes                                      +---------+---------------+---------+-----------+----------+-------------------+  SFJ      Full                                                             +---------+---------------+---------+-----------+----------+-------------------+ FV Prox  Full                                                             +---------+---------------+---------+-----------+----------+-------------------+ FV Mid   Full                                                             +---------+---------------+---------+-----------+----------+-------------------+ FV Distal               Yes      Yes                  not well visualized +---------+---------------+---------+-----------+----------+-------------------+ PFV      Full                                                              +---------+---------------+---------+-----------+----------+-------------------+ POP      Full           Yes      Yes                                      +---------+---------------+---------+-----------+----------+-------------------+ PTV                              Yes                  not well visualized +---------+---------------+---------+-----------+----------+-------------------+ PERO                             Yes                  not well visualized +---------+---------------+---------+-----------+----------+-------------------+  Left Venous Findings: +---------+---------------+---------+-----------+----------+-------------------+          CompressibilityPhasicitySpontaneityPropertiesSummary             +---------+---------------+---------+-----------+----------+-------------------+ CFV      Full           Yes      Yes                                      +---------+---------------+---------+-----------+----------+-------------------+ SFJ      Full                                                             +---------+---------------+---------+-----------+----------+-------------------+  FV Prox  Full                                                             +---------+---------------+---------+-----------+----------+-------------------+ FV Mid   Full                                                             +---------+---------------+---------+-----------+----------+-------------------+ FV DistalFull                                                             +---------+---------------+---------+-----------+----------+-------------------+ PFV      Full                                                             +---------+---------------+---------+-----------+----------+-------------------+ POP      Full           Yes      Yes                                       +---------+---------------+---------+-----------+----------+-------------------+ PTV      Partial                 No                   Age Indeterminate   +---------+---------------+---------+-----------+----------+-------------------+ PERO                                                  Not well visualized +---------+---------------+---------+-----------+----------+-------------------+ GSV      None                                         Acute               +---------+---------------+---------+-----------+----------+-------------------+  Left Technical Findings: GSV on calf region: thrombosed.   Summary: Right: There is no evidence of deep vein thrombosis in the lower extremity. However, portions of this examination were limited- see technologist comments above. No cystic structure found in the popliteal fossa. Left: Findings consistent with acute superficial vein thrombosis involving the left great saphenous vein. Findings consistent with age indeterminate deep vein thrombosis involving the left posterior tibial vein. No cystic structure found in the popliteal  fossa.  *See table(s) above for measurements and observations.    Preliminary     Medications:   . allopurinol  300 mg Oral Daily  . aspirin  325 mg Oral Daily  . atorvastatin  10 mg Oral QPM  . [START ON 06/04/2018] azithromycin  500 mg Oral Q M,W,F  . calcium-vitamin D  1 tablet Oral Q lunch  . cholecalciferol  2,000 Units Oral Q lunch  . colchicine  0.6 mg Oral Daily  . diclofenac sodium  2 g Topical TID  . docusate sodium  100 mg Oral BID  . DULoxetine  60 mg Oral Daily  . fluticasone  2 spray Each Nare Daily  . gabapentin  1,200 mg Oral TID  . guaiFENesin  1,200 mg Oral BID  . heparin  4,000 Units Intravenous Once  . hydroxychloroquine  200 mg Oral Daily  . insulin aspart  0-15 Units Subcutaneous TID WC  . insulin aspart  0-5 Units Subcutaneous QHS  . irbesartan  150 mg Oral Daily  . levothyroxine  50 mcg  Oral QAC breakfast  . metoprolol succinate  50 mg Oral QPC breakfast  . morphine  15 mg Oral Q12H  . multivitamin with minerals  1 tablet Oral Daily  . pantoprazole  40 mg Oral QAC breakfast  . predniSONE  12.5 mg Oral q morning - 10a  . [START ON 06/10/2018] Semaglutide(0.25 or 0.5MG/DOS)  0.5 mg Subcutaneous Weekly  . Tofacitinib Citrate  5 mg Oral BID   Continuous Infusions: . cefTRIAXone (ROCEPHIN)  IV 1 g (06/02/18 2307)  . heparin       LOS: 0 days   Geradine Girt  [facility]   *Please refer to [email or web address], password [email or web address] to get updated schedule on who will round on this patient, as hospitalists switch teams weekly. If 7PM-7AM, please contact night-coverage at [email or web address], password [email or web address] for any overnight needs.  06/03/2018, 3:22 PM

## 2018-06-03 NOTE — Progress Notes (Signed)
ANTICOAGULATION CONSULT NOTE - Initial Consult  Pharmacy Consult:  Heparin Indication:  Acute DVT  Allergies  Allergen Reactions  . Codeine Swelling and Other (See Comments)    Facial swelling Chest pain and swelling in legs  Facial swelling  . Infliximab Anaphylaxis    "sent me into shock" "sent me into shock"  . Lisinopril Swelling    Face and neck swelling Face and neck swelling    Patient Measurements: Height: _0  (157.5 cm) Weight: 272 lb (123.4 kg) IBW/kg (Calculated) : 50.1 Heparin Dosing Weight: 81 kg  Vital Signs: Temp: 98.2 F (36.8 C) (01/16 1147) Temp Source: Oral (01/16 1147) BP: 136/88 (01/16 1147) Pulse Rate: 89 (01/16 1147)  Labs: Recent Labs    06/02/18 1306 06/02/18 1829 06/02/18 2333 06/03/18 0435  HGB 12.2  --   --  11.2*  HCT 40.6  --   --  35.0*  PLT 167  --   --  157  CREATININE 1.43*  --   --  1.18*  TROPONINI  --  <0.03 <0.03  --     Estimated Creatinine Clearance: 50.8 mL/min (A) (by C-G formula based on SCr of 1.18 mg/dL (H)).   Medical History: Past Medical History:  Diagnosis Date  . Acute on chronic diastolic (congestive) heart failure ([place])   . Anemia    iron deficiency anemia - secondary to blood loss ( chronic)   . Arthritis    endstage changes bilateral knees/bilateral ankles.   . Asthma   . Carotid artery occlusion   . Chronic fatigue   . Chronic kidney disease   . Closed left hip fracture ([place])   . Clotting disorder ([place])    pt denies this  . Contusion of left knee    due to fall 1/14.  [name] COPD (chronic obstructive pulmonary disease) (HCC)    pulmonary fibrosis  . Depression, reactive   . Diabetes mellitus    type II   . Diastolic dysfunction   . Difficulty in walking   . Family history of heart disease   . Generalized muscle weakness   . Gout   . High cholesterol   . History of falling   . Hypertension   . Hypothyroidism   . Interstitial lung disease ([place])   . Meningioma of left sphenoid wing  involving cavernous sinus (HCC) 02/17/2012   Continue diplopia, left eye pain and left headaches.    . Morbid obesity ([place])   . Morbid obesity ([place])   . Neuromuscular disorder ([place])    diabetic neuropathy   . Normal coronary arteries    cardiac catheterization performed  10/31/14  . RA (rheumatoid arthritis) ([place])    has been off methotreaxte since 10/13.  [name] Rheumatoid arthritis ([place])   . Shortness of breath   . Spinal stenosis of lumbar region   . Thyroid disease   . Unspecified lack of coordination   . URI (upper respiratory infection)       Assessment: 49 YOF presented with chest pain and syncope.  Found to have acute superficial thrombosis in left saphenous vein and age indeterminate DVT in left tibial vein on Doppler.  Pharmacy consulted to initiate IV heparin.  Baseline labs, home meds and current meds reviewed.  Noted patient received prophylactic Lovenox yesterday evening.  Goal of Therapy:  Heparin level 0.3-0.7 units/ml Monitor platelets by anticoagulation protocol: Yes   Plan:  D/C SQ Lovenox Heparin 4000 units IV bolus, then Heparin gtt at 1300 units/hr Check 8 hr  heparin level Daily heparin level and CBC   Florentino Laabs D. Mina Marble, PharmD, BCPS, [place] 06/03/2018, 2:52 PM

## 2018-06-03 NOTE — Care Management (Signed)
Benefits check sent and pending for Eliquis vs Xarelto. CM will follow-up to discuss est copay cost once determined  [place], BSN, NCM-BC, ACM-RN [phone]

## 2018-06-03 NOTE — Care Management Obs Status (Signed)
[facility] NOTIFICATION   Patient Details  Name: [name] MRN: [id number] Date of Birth: 09-09-1940   Medicare Observation Status Notification Given:  Yes    Midge Minium RN, BSN, NCM-BC, ACM-RN [phone] 06/03/2018, 3:41 PM

## 2018-06-03 NOTE — Progress Notes (Signed)
Pt stated took home dose Semaglutide 0.5MG/DOS, before given med to nurse to send to pharmacy. Pharmacy called and made aware and dose rescheduled for next week due date.

## 2018-06-03 NOTE — Progress Notes (Addendum)
Bilateral lower extremities venous duplex exam completed.   Positive for Acute superficial veins thrombosis involving left great saphenous vein on calf region. Positive for age indeterminate deep veins thrombosis involving left posterior tibial veins mid segment.  More details please see preliminary notes on CV PROC under chart review. Iman Reinertsen H Falynn Ailey(RDMS RVT) 06/03/18 2:16 PM

## 2018-06-04 ENCOUNTER — Observation Stay (HOSPITAL_BASED_OUTPATIENT_CLINIC_OR_DEPARTMENT_OTHER): Payer: Medicare Other

## 2018-06-04 DIAGNOSIS — R079 Chest pain, unspecified: Secondary | ICD-10-CM | POA: Diagnosis not present

## 2018-06-04 DIAGNOSIS — Z79899 Other long term (current) drug therapy: Secondary | ICD-10-CM

## 2018-06-04 DIAGNOSIS — I824Y9 Acute embolism and thrombosis of unspecified deep veins of unspecified proximal lower extremity: Secondary | ICD-10-CM

## 2018-06-04 DIAGNOSIS — I361 Nonrheumatic tricuspid (valve) insufficiency: Secondary | ICD-10-CM

## 2018-06-04 DIAGNOSIS — J849 Interstitial pulmonary disease, unspecified: Secondary | ICD-10-CM | POA: Diagnosis not present

## 2018-06-04 DIAGNOSIS — I951 Orthostatic hypotension: Secondary | ICD-10-CM | POA: Diagnosis not present

## 2018-06-04 DIAGNOSIS — I82442 Acute embolism and thrombosis of left tibial vein: Secondary | ICD-10-CM | POA: Diagnosis not present

## 2018-06-04 LAB — URINE CULTURE

## 2018-06-04 LAB — CBC
HCT: 36.8 % (ref 36.0–46.0)
Hemoglobin: 11.3 g/dL — ABNORMAL LOW (ref 12.0–15.0)
MCH: 30.7 pg (ref 26.0–34.0)
MCHC: 30.7 g/dL (ref 30.0–36.0)
MCV: 100 fL (ref 80.0–100.0)
NRBC: 0 % (ref 0.0–0.2)
PLATELETS: 158 10*3/uL (ref 150–400)
RBC: 3.68 MIL/uL — ABNORMAL LOW (ref 3.87–5.11)
RDW: 15.2 % (ref 11.5–15.5)
WBC: 5.2 10*3/uL (ref 4.0–10.5)

## 2018-06-04 LAB — ECHOCARDIOGRAM COMPLETE
Height: 62 in
Weight: 4303.38 oz

## 2018-06-04 LAB — BASIC METABOLIC PANEL
Anion gap: 11 (ref 5–15)
BUN: 13 mg/dL (ref 8–23)
CO2: 29 mmol/L (ref 22–32)
Calcium: 8.5 mg/dL — ABNORMAL LOW (ref 8.9–10.3)
Chloride: 100 mmol/L (ref 98–111)
Creatinine, Ser: 1.14 mg/dL — ABNORMAL HIGH (ref 0.44–1.00)
GFR calc Af Amer: 54 mL/min — ABNORMAL LOW (ref >60)
GFR, EST NON AFRICAN AMERICAN: 47 mL/min — AB (ref >60)
Glucose, Bld: 105 mg/dL — ABNORMAL HIGH (ref 70–99)
Potassium: 3.4 mmol/L — ABNORMAL LOW (ref 3.5–5.1)
Sodium: 140 mmol/L (ref 135–145)

## 2018-06-04 LAB — GLUCOSE, CAPILLARY
Glucose-Capillary: 129 mg/dL — ABNORMAL HIGH (ref 70–99)
Glucose-Capillary: 126 mg/dL — ABNORMAL HIGH (ref 70–99)
Glucose-Capillary: 112 mg/dL — ABNORMAL HIGH (ref 70–99)

## 2018-06-04 LAB — HEPARIN LEVEL (UNFRACTIONATED)
HEPARIN UNFRACTIONATED: 0.75 [IU]/mL — AB (ref 0.30–0.70)
Heparin Unfractionated: 0.78 IU/mL — ABNORMAL HIGH (ref 0.30–0.70)

## 2018-06-04 MED ORDER — PERFLUTREN LIPID MICROSPHERE
1.0000 mL | INTRAVENOUS | Status: AC | PRN
  Administered 2018-06-04: 2 mL via INTRAVENOUS
  Filled 2018-06-04: qty 10

## 2018-06-04 MED ORDER — IRBESARTAN 150 MG PO TABS
75.0000 mg | ORAL_TABLET | Freq: Every day | ORAL | Status: DC
  Administered 2018-06-05: 75 mg via ORAL
  Filled 2018-06-04: qty 1

## 2018-06-04 MED ORDER — POTASSIUM CHLORIDE CRYS ER 20 MEQ PO TBCR
40.0000 meq | EXTENDED_RELEASE_TABLET | Freq: Once | ORAL | Status: AC
  Administered 2018-06-04: 40 meq via ORAL
  Filled 2018-06-04: qty 2

## 2018-06-04 MED ORDER — FUROSEMIDE 40 MG PO TABS
40.0000 mg | ORAL_TABLET | Freq: Every day | ORAL | Status: DC
  Administered 2018-06-04 – 2018-06-05 (×2): 40 mg via ORAL
  Filled 2018-06-04 (×2): qty 1

## 2018-06-04 MED ORDER — APIXABAN 5 MG PO TABS
10.0000 mg | ORAL_TABLET | Freq: Two times a day (BID) | ORAL | Status: DC
  Administered 2018-06-04 – 2018-06-05 (×3): 10 mg via ORAL
  Filled 2018-06-04 (×3): qty 2

## 2018-06-04 MED ORDER — MAGNESIUM SULFATE 2 GM/50ML IV SOLN
2.0000 g | Freq: Once | INTRAVENOUS | Status: AC
  Administered 2018-06-04: 2 g via INTRAVENOUS
  Filled 2018-06-04: qty 50

## 2018-06-04 MED ORDER — APIXABAN 5 MG PO TABS: 5.0000 mg | ORAL_TABLET | Freq: Two times a day (BID) | ORAL | Status: DC

## 2018-06-04 NOTE — Progress Notes (Signed)
    ECHO reassuring - Left ventricle: The cavity size was normal. There was mild focal   basal hypertrophy of the septum. Systolic function was normal.   The estimated ejection fraction was in the range of 60% to 65%.   Wall motion was normal; there were no regional wall motion   abnormalities. - Right ventricle: The cavity size was mildly dilated. Wall   thickness was normal. - Pulmonary arteries: Systolic pressure was mildly increased.  OK for dc from cardiac perspective  [place] will sign off.   Medication Recommendations:  No new Other recommendations (labs, testing, etc):  none Follow up as an outpatient:  None needed.   Candee Furbish, MD

## 2018-06-04 NOTE — Discharge Instructions (Addendum)
Dehydration, Adult  Dehydration is a condition in which there is not enough fluid or water in the body. This happens when you lose more fluids than you take in. Important organs, such as the kidneys, brain, and heart, cannot function without a proper amount of fluids. Any loss of fluids from the body can lead to dehydration. Dehydration can range from mild to severe. This condition should be treated right away to prevent it from becoming severe. What are the causes? This condition may be caused by:  Vomiting.  Diarrhea.  Excessive sweating, such as from heat exposure or exercise.  Not drinking enough fluid, especially: ? When ill. ? While doing activity that requires a lot of energy.  Excessive urination.  Fever.  Infection.  Certain medicines, such as medicines that cause the body to lose excess fluid (diuretics).  Inability to access safe drinking water.  Reduced physical ability to get adequate water and food. What increases the risk? This condition is more likely to develop in people:  Who have a poorly controlled long-term (chronic) illness, such as diabetes, heart disease, or kidney disease.  Who are age 55 or older.  Who are disabled.  Who live in a place with high altitude.  Who play endurance sports. What are the signs or symptoms? Symptoms of mild dehydration may include:  Thirst.  Dry lips.  Slightly dry mouth.  Dry, warm skin.  Dizziness. Symptoms of moderate dehydration may include:  Very dry mouth.  Muscle cramps.  Dark urine. Urine may be the color of tea.  Decreased urine production.  Decreased tear production.  Heartbeat that is irregular or faster than normal (palpitations).  Headache.  Light-headedness, especially when you stand up from a sitting position.  Fainting (syncope). Symptoms of severe dehydration may include:  Changes in skin, such as: ? Cold and clammy skin. ? Blotchy (mottled) or pale skin. ? Skin that  does not quickly return to normal after being lightly pinched and released (poor skin turgor).  Changes in body fluids, such as: ? Extreme thirst. ? No tear production. ? Inability to sweat when body temperature is high, such as in hot weather. ? Very little urine production.  Changes in vital signs, such as: ? Weak pulse. ? Pulse that is more than 100 beats a minute when sitting still. ? Rapid breathing. ? Low blood pressure.  Other changes, such as: ? Sunken eyes. ? Cold hands and feet. ? Confusion. ? Lack of energy (lethargy). ? Difficulty waking up from sleep. ? Short-term weight loss. ? Unconsciousness. How is this diagnosed? This condition is diagnosed based on your symptoms and a physical exam. Blood and urine tests may be done to help confirm the diagnosis. How is this treated? Treatment for this condition depends on the severity. Mild or moderate dehydration can often be treated at home. Treatment should be started right away. Do not wait until dehydration becomes severe. Severe dehydration is an emergency and it needs to be treated in a hospital. Treatment for mild dehydration may include:  Drinking more fluids.  Replacing salts and minerals in your blood (electrolytes) that you may have lost. Treatment for moderate dehydration may include:  Drinking an oral rehydration solution (ORS). This is a drink that helps you replace fluids and electrolytes (rehydrate). It can be found at pharmacies and retail stores. Treatment for severe dehydration may include:  Receiving fluids through an IV tube.  Receiving an electrolyte solution through a feeding tube that is passed through your nose  and into your stomach (nasogastric tube, or NG tube).  Correcting any abnormalities in electrolytes.  Treating the underlying cause of dehydration. Follow these instructions at home:  If directed by your health care provider, drink an ORS: ? Make an ORS by following instructions on the  package. ? Start by drinking small amounts, about  cup (120 mL) every 5-10 minutes. ? Slowly increase how much you drink until you have taken the amount recommended by your health care provider.  Drink enough clear fluid to keep your urine clear or pale yellow. If you were told to drink an ORS, finish the ORS first, then start slowly drinking other clear fluids. Drink fluids such as: ? Water. Do not drink only water. Doing that can lead to having too little salt (sodium) in the body (hyponatremia). ? Ice chips. ? Fruit juice that you have added water to (diluted fruit juice). ? Low-calorie sports drinks.  Avoid: ? Alcohol. ? Drinks that contain a lot of sugar. These include high-calorie sports drinks, fruit juice that is not diluted, and soda. ? Caffeine. ? Foods that are greasy or contain a lot of fat or sugar.  Take over-the-counter and prescription medicines only as told by your health care provider.  Do not take sodium tablets. This can lead to having too much sodium in the body (hypernatremia).  Eat foods that contain a healthy balance of electrolytes, such as bananas, oranges, potatoes, tomatoes, and spinach.  Keep all follow-up visits as told by your health care provider. This is important. Contact a health care provider if:  You have abdominal pain that: ? Gets worse. ? Stays in one area (localizes).  You have a rash.  You have a stiff neck.  You are more irritable than usual.  You are sleepier or more difficult to wake up than usual.  You feel weak or dizzy.  You feel very thirsty.  You have urinated only a small amount of very dark urine over 6-8 hours. Get help right away if:  You have symptoms of severe dehydration.  You cannot drink fluids without vomiting.  Your symptoms get worse with treatment.  You have a fever.  You have a severe headache.  You have vomiting or diarrhea that: ? Gets worse. ? Does not go away.  You have blood or green matter  (bile) in your vomit.  You have blood in your stool. This may cause stool to look black and tarry.  You have not urinated in 6-8 hours.  You faint.  Your heart rate while sitting still is over 100 beats a minute.  You have trouble breathing. This information is not intended to replace advice given to you by your health care provider. Make sure you discuss any questions you have with your health care provider. Document Released: 05/05/2005 Document Revised: 11/30/2015 Document Reviewed: 06/29/2015 Elsevier Interactive Patient Education  2019 Trainer on my medicine - ELIQUIS (apixaban)  This medication education was reviewed with me or my healthcare representative as part of my discharge preparation.    Why was Eliquis prescribed for you? Eliquis was prescribed to treat blood clots that may have been found in the veins of your legs (deep vein thrombosis) or in your lungs (pulmonary embolism) and to reduce the risk of them occurring again.  What do You need to know about Eliquis ? The starting dose is 10 mg (two 5 mg tablets) taken TWICE daily for the FIRST SEVEN (7) DAYS, then on  06/11/2018  the  dose is reduced to ONE 5 mg tablet taken TWICE daily.  Eliquis may be taken with or without food.   Try to take the dose about the same time in the morning and in the evening. If you have difficulty swallowing the tablet whole please discuss with your pharmacist how to take the medication safely.  Take Eliquis exactly as prescribed and DO NOT stop taking Eliquis without talking to the doctor who prescribed the medication.  Stopping may increase your risk of developing a new blood clot.  Refill your prescription before you run out.  After discharge, you should have regular check-up appointments with your healthcare provider that is prescribing your Eliquis.    What do you do if you miss a dose? If a dose of ELIQUIS is not taken at the scheduled time, take it as soon as  possible on the same day and twice-daily administration should be resumed. The dose should not be doubled to make up for a missed dose.  Important Safety Information A possible side effect of Eliquis is bleeding. You should call your healthcare provider right away if you experience any of the following: ? Bleeding from an injury or your nose that does not stop. ? Unusual colored urine (red or dark brown) or unusual colored stools (red or black). ? Unusual bruising for unknown reasons. ? A serious fall or if you hit your head (even if there is no bleeding).  Some medicines may interact with Eliquis and might increase your risk of bleeding or clotting while on Eliquis. To help avoid this, consult your healthcare provider or pharmacist prior to using any new prescription or non-prescription medications, including herbals, vitamins, non-steroidal anti-inflammatory drugs (NSAIDs) and supplements.  This website has more information on Eliquis (apixaban): http://www.eliquis.com/eliquis/home

## 2018-06-04 NOTE — Progress Notes (Signed)
Echocardiogram 2D Echocardiogram has been performed.  [name] 06/04/2018, 9:54 AM

## 2018-06-04 NOTE — Progress Notes (Signed)
ANTICOAGULATION CONSULT NOTE   Pharmacy Consult:  Heparin Indication:  Acute DVT  Allergies  Allergen Reactions  . Codeine Swelling and Other (See Comments)    Facial swelling Chest pain and swelling in legs  Facial swelling  . Infliximab Anaphylaxis    "sent me into shock" "sent me into shock"  . Lisinopril Swelling    Face and neck swelling Face and neck swelling    Patient Measurements: Height: _0  (157.5 cm) Weight: 272 lb (123.4 kg) IBW/kg (Calculated) : 50.1 Heparin Dosing Weight: 81 kg  Vital Signs: Temp: 98 F (36.7 C) (01/16 2342) Temp Source: Oral (01/16 2342) BP: 136/79 (01/16 2342) Pulse Rate: 84 (01/16 2342)  Labs: Recent Labs    06/02/18 1306 06/02/18 1829 06/02/18 2333 06/03/18 0435 06/03/18 2339  HGB 12.2  --   --  11.2*  --   HCT 40.6  --   --  35.0*  --   PLT 167  --   --  157  --   HEPARINUNFRC  --   --   --   --  0.78*  CREATININE 1.43*  --   --  1.18*  --   TROPONINI  --  <0.03 <0.03  --   --     Estimated Creatinine Clearance: 50.8 mL/min (A) (by C-G formula based on SCr of 1.18 mg/dL (H)).   Medical History: Past Medical History:  Diagnosis Date  . Acute on chronic diastolic (congestive) heart failure ([place])   . Anemia    iron deficiency anemia - secondary to blood loss ( chronic)   . Arthritis    endstage changes bilateral knees/bilateral ankles.   . Asthma   . Carotid artery occlusion   . Chronic fatigue   . Chronic kidney disease   . Closed left hip fracture ([place])   . Clotting disorder ([place])    pt denies this  . Contusion of left knee    due to fall 1/14.  [name] COPD (chronic obstructive pulmonary disease) ([facility])    pulmonary fibrosis  . Depression, reactive   . Diabetes mellitus    type II   . Diastolic dysfunction   . Difficulty in walking   . Family history of heart disease   . Generalized muscle weakness   . Gout   . High cholesterol   . History of falling   . Hypertension   . Hypothyroidism   . Interstitial  lung disease ([place])   . 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    . Morbid obesity ([place])   . Morbid obesity ([place])   . Neuromuscular disorder ([place])    diabetic neuropathy   . Normal coronary arteries    cardiac catheterization performed  10/31/14  . RA (rheumatoid arthritis) ([place])    has been off methotreaxte since 10/13.  [name] Rheumatoid arthritis ([place])   . Shortness of breath   . Spinal stenosis of lumbar region   . Thyroid disease   . Unspecified lack of coordination   . URI (upper respiratory infection)       Assessment: 70 YOF presented with chest pain and syncope.  Found to have acute superficial thrombosis in left saphenous vein and age indeterminate DVT in left tibial vein on Doppler.  Pharmacy consulted to initiate IV heparin.  Baseline labs, home meds and current meds reviewed.  Noted patient received prophylactic Lovenox yesterday evening.  1/17 AM update: initial heparin  level just above goal, no issues per RN  Goal of Therapy:  Heparin level 0.3-0.7 units/ml Monitor platelets by anticoagulation protocol: Yes   Plan:  Dec heparin to 1150 units/hr Check 8 hr heparin level Daily heparin level and CBC  Narda Bonds, PharmD, BCPS Clinical Pharmacist Phone: [phone]

## 2018-06-04 NOTE — Progress Notes (Signed)
Progress Note    [name]  BVA:[id number] DOB: 05-11-1941  DOA: 06/02/2018 PCP: Glendale Chard, MD    Brief Narrative:    Medical records reviewed and are as summarized below:  [name] is an 78 y.o. female with medical history significant for type 2 diabetes, hypertension, hyperlipidemia, interstitial lung disease, arthritis, hypothyroidism, rheumatoid arthritis on long-term opioids, who presented to the ED today after a syncopal episode this morning at her primary care physician's office.  Patient's daughter states that she was helping her mom out of the car and she noticed that her mom's left arm started shaking just before her mom collapsed on her and although eyes were open, she was unresponsive for about 2 to 3 minutes.  Orthostatics positive but also with DVT and presumed PE.  Assessment/Plan:   Principal Problem:   Chest pain with moderate risk of acute coronary syndrome Active Problems:   Dyslipidemia   Thyroid disease   Essential hypertension   Rheumatoid arthritis ([place])   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 (HCC)   Spinal stenosis of lumbar region with radiculopathy   ILD (interstitial lung disease) (HCC)   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)   High risk medication use   Morbid obesity (HCC)   Chronic respiratory failure with hypoxia (HCC)   Acute kidney injury superimposed on chronic kidney disease (HCC)   Hypotension   Depression   GERD (gastroesophageal reflux disease)   Syncope and collapse   Chest pain   Acute lower UTI   DVT (deep venous thrombosis) (HCC)  Acute DVT with presumed PE -heparin gtt with plan to change to PO eliquis -risk factors include obesity and sedentary lifestyle -will hold on CTA/V/Q scan for now as patient has some CKD and not sure that management would change  Syncope, with complaints of chest tightness for 2 weeks -tele -echo:  - Left ventricle: The cavity size was  normal. There was mild focal basal hypertrophy of the septum. Systolic function was normal. The estimated ejection fraction was in the range of 60% to 65%. Wall motion was normal; there were no regional wall motion abnormalities. - Right ventricle: The cavity size was mildly dilated. Wall thickness was normal. - Pulmonary arteries: Systolic pressure was mildly increased -orthostatics positive as well-- may need to add TED hose  AKI on CKD III -resume lasix at lower dose -BMP in AM  Chronic respiratory failure -wears O2 at night and PRN during day -may need to wear more during day  Diabetes mellitus, type II -hold oral meds -SSI, carb controlled diet  Hyperlipidemia -cont atorvastatin  Hypertension -cont Toprol XL -monitor closely  Hypothyroidism -cont home synthroid  Depression -cont home cymbalta  GERD -cont home protonix  Hypokalemia -replete  Rheumatoid arthritis -cont home prednisone, plaquenil, Tofacitinib -cont home MS Contin, MSIR, voltaren gel  Interstitial lung disease -cont home albuterol, nebs -cont home azithromycin qMWF  Diabetic neuropathy -cont home gabapentin   Morbid obesity Body mass index is 49.19 kg/m.  PT Eval-- poor support at home-- husband and patient are both medically frail.  Children to become more involved.  Have added back lasix, decreased ARB and changed to eliquis-- plan to watch overnight due to home situation and medical fraility   Family Communication/Anticipated D/C date and plan/Code Status   DVT prophylaxis: eliquis Code Status: Full Code.  Family Communication: at bedside Disposition Plan: home in the AM?   Medical Consultants:    cards  Subjective:   Says she had a "rough" morning  Objective:    Vitals:   06/04/18 0355 06/04/18 0500 06/04/18 0700 06/04/18 1300  BP: 130/79  110/64 119/74  Pulse: 88  96 95  Resp:   16 18  Temp: (!) 97.4 F (36.3 C)  98 F (36.7 C) 98.6  F (37 C)  TempSrc: Oral  Oral Oral  SpO2: 97%  94% 93%  Weight:  122 kg    Height:        Intake/Output Summary (Last 24 hours) at 06/04/2018 1716 Last data filed at 06/04/2018 1300 Gross per 24 hour  Intake 756.11 ml  Output -  Net 756.11 ml   Filed Weights   06/02/18 1814 06/03/18 0500 06/04/18 0500  Weight: 122.5 kg 123.4 kg 122 kg    Exam: In chair Legs appear more swollen b/l Diminished breath sounds, no wheezing rrr Obese A+Ox3 Able to speak in complete sentences   Data Reviewed:   I have personally reviewed following labs and imaging studies:  Labs: Labs show the following:   Basic Metabolic Panel: Recent Labs  Lab 06/02/18 1306 06/03/18 0435 06/03/18 1627 06/04/18 1040  NA 138 143  --  140  K 3.6 3.0*  --  3.4*  CL 97* 104  --  100  CO2 29 29  --  29  GLUCOSE 204* 104*  --  105*  BUN 22 18  --  13  CREATININE 1.43* 1.18*  --  1.14*  CALCIUM 8.6* 8.3*  --  8.5*  MG  --   --  1.8  --    GFR Estimated Creatinine Clearance: 52.3 mL/min (A) (by C-G formula based on SCr of 1.14 mg/dL (H)). Liver Function Tests: No results for input(s): AST, ALT, ALKPHOS, BILITOT, PROT, ALBUMIN in the last 168 hours. No results for input(s): LIPASE, AMYLASE in the last 168 hours. No results for input(s): AMMONIA in the last 168 hours. Coagulation profile No results for input(s): INR, PROTIME in the last 168 hours.  CBC: Recent Labs  Lab 06/02/18 1306 06/03/18 0435 06/04/18 1040  WBC 5.7 5.1 5.2  HGB 12.2 11.2* 11.3*  HCT 40.6 35.0* 36.8  MCV 99.8 97.8 100.0  PLT 167 157 158   Cardiac Enzymes: Recent Labs  Lab 06/02/18 1829 06/02/18 2333  TROPONINI <0.03 <0.03   BNP (last 3 results) No results for input(s): PROBNP in the last 8760 hours. CBG: Recent Labs  Lab 06/03/18 0647 06/03/18 1114 06/03/18 1633 06/04/18 1148 06/04/18 1644  GLUCAP 88 119* 142* 129* 126*   D-Dimer: No results for input(s): DDIMER in the last 72 hours. Hgb A1c: No  results for input(s): HGBA1C in the last 72 hours. Lipid Profile: No results for input(s): CHOL, HDL, LDLCALC, TRIG, CHOLHDL, LDLDIRECT in the last 72 hours. Thyroid function studies: No results for input(s): TSH, T4TOTAL, T3FREE, THYROIDAB in the last 72 hours.  Invalid input(s): FREET3 Anemia work up: No results for input(s): VITAMINB12, FOLATE, FERRITIN, TIBC, IRON, RETICCTPCT in the last 72 hours. Sepsis Labs: Recent Labs  Lab 06/02/18 1306 06/03/18 0435 06/04/18 1040  WBC 5.7 5.1 5.2    Microbiology Recent Results (from the past 240 hour(s))  Culture, Urine     Status: None   Collection Time: 06/02/18 10:57 PM  Result Value Ref Range Status   Specimen Description URINE, CLEAN CATCH  Final   Special Requests   Final    NONE Performed at [facility], [place],  [place] [id number]    Culture   Final    Multiple bacterial morphotypes present, none predominant. Suggest appropriate recollection if clinically indicated.   Report Status 06/04/2018 FINAL  Final    Procedures and diagnostic studies:  Vas [place] Lower Extremity Venous (dvt)  Result Date: 06/03/2018  Lower Venous Study Indications: Pain, Swelling, Edema, and R/o DVT.  Limitations: Body habitus. Comparison Study: Venous study on 04/18/2015 with negative result. Performing Technologist: Rudell Cobb  Examination Guidelines: A complete evaluation includes B-mode imaging, spectral Doppler, color Doppler, and power Doppler as needed of all accessible portions of each vessel. Bilateral testing is considered an integral part of a complete examination. Limited examinations for reoccurring indications may be performed as noted.  Right Venous Findings: +---------+---------------+---------+-----------+----------+-------------------+          CompressibilityPhasicitySpontaneityPropertiesSummary             +---------+---------------+---------+-----------+----------+-------------------+ CFV      Full            Yes      Yes                                      +---------+---------------+---------+-----------+----------+-------------------+ SFJ      Full                                                             +---------+---------------+---------+-----------+----------+-------------------+ FV Prox  Full                                                             +---------+---------------+---------+-----------+----------+-------------------+ FV Mid   Full                                                             +---------+---------------+---------+-----------+----------+-------------------+ FV Distal               Yes      Yes                  not well visualized +---------+---------------+---------+-----------+----------+-------------------+ PFV      Full                                                             +---------+---------------+---------+-----------+----------+-------------------+ POP      Full           Yes      Yes                                      +---------+---------------+---------+-----------+----------+-------------------+ PTV  Yes                  not well visualized +---------+---------------+---------+-----------+----------+-------------------+ PERO                             Yes                  not well visualized +---------+---------------+---------+-----------+----------+-------------------+  Left Venous Findings: +---------+---------------+---------+-----------+----------+-------------------+          CompressibilityPhasicitySpontaneityPropertiesSummary             +---------+---------------+---------+-----------+----------+-------------------+ CFV      Full           Yes      Yes                                      +---------+---------------+---------+-----------+----------+-------------------+ SFJ      Full                                                              +---------+---------------+---------+-----------+----------+-------------------+ FV Prox  Full                                                             +---------+---------------+---------+-----------+----------+-------------------+ FV Mid   Full                                                             +---------+---------------+---------+-----------+----------+-------------------+ FV DistalFull                                                             +---------+---------------+---------+-----------+----------+-------------------+ PFV      Full                                                             +---------+---------------+---------+-----------+----------+-------------------+ POP      Full           Yes      Yes                                      +---------+---------------+---------+-----------+----------+-------------------+ PTV      Partial                 No                   Age Indeterminate   +---------+---------------+---------+-----------+----------+-------------------+  PERO                                                  Not well visualized +---------+---------------+---------+-----------+----------+-------------------+ GSV      None                                         Acute               +---------+---------------+---------+-----------+----------+-------------------+  Left Technical Findings: GSV on calf region: thrombosed.   Summary: Right: There is no evidence of deep vein thrombosis in the lower extremity. However, portions of this examination were limited- see technologist comments above. No cystic structure found in the popliteal fossa. Left: Findings consistent with acute superficial vein thrombosis involving the left great saphenous vein. Findings consistent with age indeterminate deep vein thrombosis involving the left posterior tibial vein. No cystic structure found in the popliteal  fossa.  *See table(s) above for  measurements and observations. Electronically signed by Servando Snare MD on 06/03/2018 at 3:30:21 PM.    Final     Medications:   . allopurinol  300 mg Oral Daily  . apixaban  10 mg Oral BID   Followed by  . [START ON 06/11/2018] apixaban  5 mg Oral BID  . aspirin  325 mg Oral Daily  . atorvastatin  10 mg Oral QPM  . azithromycin  500 mg Oral Q M,W,F  . calcium-vitamin D  1 tablet Oral Q lunch  . cholecalciferol  2,000 Units Oral Q lunch  . colchicine  0.6 mg Oral Daily  . diclofenac sodium  2 g Topical TID  . docusate sodium  100 mg Oral BID  . DULoxetine  60 mg Oral Daily  . fluticasone  2 spray Each Nare Daily  . furosemide  40 mg Oral Daily  . gabapentin  1,200 mg Oral TID  . guaiFENesin  1,200 mg Oral BID  . hydroxychloroquine  200 mg Oral Daily  . insulin aspart  0-15 Units Subcutaneous TID WC  . insulin aspart  0-5 Units Subcutaneous QHS  . [START ON 06/05/2018] irbesartan  75 mg Oral Daily  . levothyroxine  50 mcg Oral QAC breakfast  . metoprolol succinate  50 mg Oral QPC breakfast  . morphine  15 mg Oral Q12H  . multivitamin with minerals  1 tablet Oral Daily  . pantoprazole  40 mg Oral QAC breakfast  . predniSONE  12.5 mg Oral q morning - 10a  . [START ON 06/10/2018] Semaglutide(0.25 or 0.5MG/DOS)  0.5 mg Subcutaneous Weekly  . Tofacitinib Citrate  5 mg Oral BID   Continuous Infusions:    LOS: 0 days   Geradine Girt  [facility]   *Please refer to [email or web address], password [email or web address] to get updated schedule on who will round on this patient, as hospitalists switch teams weekly. If 7PM-7AM, please contact night-coverage at [email or web address], password [email or web address] for any overnight needs.  06/04/2018, 5:16 PM

## 2018-06-04 NOTE — Care Management Note (Addendum)
Case Management Note  Patient Details  Name: [name] MRN: [id number] Date of Birth: 06/12/1940  Subjective/Objective:  77 yo female presented with CP and found to have an acute DVT.                 Action/Plan: CM met with patient/family to discuss transitional needs. Patient lives at home with her spouse, independent PTA. Patient has active insurance with: Medicare A/B and [facility]. Patient indicated no cost for her medications if the prescriptions are filled by [facility], [place]. Xarelto 30 day free card provided and patient will be provided a Rx by Dr. Vann for [facility]. No further needs from CM.   Expected Discharge Date:  06/03/18               Expected Discharge Plan:  Home/Self Care  In-House Referral:  NA  Discharge planning Services  CM Consult, Medication Assistance(DOAC benefits check)  Post Acute Care Choice:  NA Choice offered to:  NA  DME Arranged:  N/A DME Agency:  NA  HH Arranged:  NA HH Agency:  NA  Status of Service:  Completed, signed off  If discussed at Long Length of Stay Meetings, dates discussed:    Additional Comments:    RN, BSN, NCM-BC, ACM-RN [phone] 06/04/2018, 2:39 PM  

## 2018-06-04 NOTE — [place] Notes (Signed)
ANTICOAGULATION CONSULT NOTE   Pharmacy Consult:  Apixaban Indication:  Acute DVT/PE  Allergies  Allergen Reactions  . Codeine Swelling and Other (See Comments)    Facial swelling Chest pain and swelling in legs  Facial swelling  . Infliximab Anaphylaxis    "sent me into shock" "sent me into shock"  . Lisinopril Swelling    Face and neck swelling Face and neck swelling    Patient Measurements: Height: _0  (157.5 cm) Weight: 268 lb 15.4 oz (122 kg) IBW/kg (Calculated) : 50.1 Heparin Dosing Weight: 81 kg  Vital Signs: Temp: 98.6 F (37 C) (01/17 1300) Temp Source: Oral (01/17 1300) BP: 119/74 (01/17 1300) Pulse Rate: 95 (01/17 1300)  Labs: Recent Labs    06/02/18 1306 06/02/18 1829 06/02/18 2333 06/03/18 0435 06/03/18 2339 06/04/18 1040  HGB 12.2  --   --  11.2*  --  11.3*  HCT 40.6  --   --  35.0*  --  36.8  PLT 167  --   --  157  --  158  HEPARINUNFRC  --   --   --   --  0.78* 0.75*  CREATININE 1.43*  --   --  1.18*  --  1.14*  TROPONINI  --  <0.03 <0.03  --   --   --     Estimated Creatinine Clearance: 52.3 mL/min (A) (by C-G formula based on SCr of 1.14 mg/dL (H)).   Medical History: Past Medical History:  Diagnosis Date  . Acute on chronic diastolic (congestive) heart failure ([place])   . Anemia    iron deficiency anemia - secondary to blood loss ( chronic)   . Arthritis    endstage changes bilateral knees/bilateral ankles.   . Asthma   . Carotid artery occlusion   . Chronic fatigue   . Chronic kidney disease   . Closed left hip fracture ([place])   . Clotting disorder ([place])    pt denies this  . Contusion of left knee    due to fall 1/14.  [name] COPD (chronic obstructive pulmonary disease) (HCC)    pulmonary fibrosis  . Depression, reactive   . Diabetes mellitus    type II   . Diastolic dysfunction   . Difficulty in walking   . Family history of heart disease   . Generalized muscle weakness   . Gout   . High cholesterol   . History of falling    . Hypertension   . Hypothyroidism   . Interstitial lung disease ([place])   . 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    . Morbid obesity ([place])   . Morbid obesity ([place])   . Neuromuscular disorder ([place])    diabetic neuropathy   . Normal coronary arteries    cardiac catheterization performed  10/31/14  . RA (rheumatoid arthritis) ([place])    has been off methotreaxte since 10/13.  [name] Rheumatoid arthritis ([place])   . Shortness of breath   . Spinal stenosis of lumbar region   . Thyroid disease   . Unspecified lack of coordination   . URI (upper respiratory infection)       Assessment: 48 YOF presented with chest pain and syncope.  Found to have acute superficial thrombosis in left saphenous vein and age indeterminate DVT in left tibial vein on Doppler.  Pharmacy consulted to initiate Apixaban.  Baseline labs, home meds and current meds reviewed.  Patient currently  on heparin IV    Goal of Therapy:  Monitor platelets by anticoagulation protocol: Yes   Plan:  Apixaban 18m PO BID x 7 days then 561mBID D/C heparin once first dose of apixaban is given.   Charlette Hennings A. PiLevada DyPharmD, BCDeep Riverager: [phone] utilize Amion for appropriate phone number to reach the unit pharmacist ([place]

## 2018-06-04 NOTE — [place] Notes (Signed)
ANTICOAGULATION CONSULT NOTE   Pharmacy Consult:  Heparin Indication:  Acute DVT  Allergies  Allergen Reactions  . Codeine Swelling and Other (See Comments)    Facial swelling Chest pain and swelling in legs  Facial swelling  . Infliximab Anaphylaxis    "sent me into shock" "sent me into shock"  . Lisinopril Swelling    Face and neck swelling Face and neck swelling    Patient Measurements: Height: _0  (157.5 cm) Weight: 268 lb 15.4 oz (122 kg) IBW/kg (Calculated) : 50.1 Heparin Dosing Weight: 81 kg  Vital Signs: Temp: 98 F (36.7 C) (01/17 0700) Temp Source: Oral (01/17 0700) BP: 110/64 (01/17 0700) Pulse Rate: 96 (01/17 0700)  Labs: Recent Labs    06/02/18 1306 06/02/18 1829 06/02/18 2333 06/03/18 0435 06/03/18 2339 06/04/18 1040  HGB 12.2  --   --  11.2*  --  11.3*  HCT 40.6  --   --  35.0*  --  36.8  PLT 167  --   --  157  --  158  HEPARINUNFRC  --   --   --   --  0.78* 0.75*  CREATININE 1.43*  --   --  1.18*  --  1.14*  TROPONINI  --  <0.03 <0.03  --   --   --     Estimated Creatinine Clearance: 52.3 mL/min (A) (by C-G formula based on SCr of 1.14 mg/dL (H)).   Medical History: Past Medical History:  Diagnosis Date  . Acute on chronic diastolic (congestive) heart failure ([place])   . Anemia    iron deficiency anemia - secondary to blood loss ( chronic)   . Arthritis    endstage changes bilateral knees/bilateral ankles.   . Asthma   . Carotid artery occlusion   . Chronic fatigue   . Chronic kidney disease   . Closed left hip fracture ([place])   . Clotting disorder ([place])    pt denies this  . Contusion of left knee    due to fall 1/14.  [name] COPD (chronic obstructive pulmonary disease) (HCC)    pulmonary fibrosis  . Depression, reactive   . Diabetes mellitus    type II   . Diastolic dysfunction   . Difficulty in walking   . Family history of heart disease   . Generalized muscle weakness   . Gout   . High cholesterol   . History of falling    . Hypertension   . Hypothyroidism   . Interstitial lung disease ([place])   . 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    . Morbid obesity ([place])   . Morbid obesity ([place])   . Neuromuscular disorder ([place])    diabetic neuropathy   . Normal coronary arteries    cardiac catheterization performed  10/31/14  . RA (rheumatoid arthritis) ([place])    has been off methotreaxte since 10/13.  [name] Rheumatoid arthritis ([place])   . Shortness of breath   . Spinal stenosis of lumbar region   . Thyroid disease   . Unspecified lack of coordination   . URI (upper respiratory infection)       Assessment: 53 YOF presented with chest pain and syncope.  Found to have acute superficial thrombosis in left saphenous vein and age indeterminate DVT in left tibial vein on Doppler.  Pharmacy consulted to initiate IV heparin.  Baseline labs, home meds and current meds reviewed.  Noted  patient received prophylactic Lovenox yesterday evening.  Heparin level 0.75 (slightly above goal)  Goal of Therapy:  Heparin level 0.3-0.7 units/ml Monitor platelets by anticoagulation protocol: Yes   Plan:  Dec heparin to 1000 units/hr Check 8 hr heparin level Daily heparin level and CBC  Caryl Manas A. Levada Dy, PharmD, [place] Pager: [phone] Please utilize Amion for appropriate phone number to reach the unit pharmacist ([place])

## 2018-06-05 DIAGNOSIS — I824Y9 Acute embolism and thrombosis of unspecified deep veins of unspecified proximal lower extremity: Secondary | ICD-10-CM | POA: Diagnosis not present

## 2018-06-05 DIAGNOSIS — I10 Essential (primary) hypertension: Secondary | ICD-10-CM | POA: Diagnosis not present

## 2018-06-05 DIAGNOSIS — N189 Chronic kidney disease, unspecified: Secondary | ICD-10-CM | POA: Diagnosis not present

## 2018-06-05 DIAGNOSIS — N179 Acute kidney failure, unspecified: Secondary | ICD-10-CM

## 2018-06-05 DIAGNOSIS — E785 Hyperlipidemia, unspecified: Secondary | ICD-10-CM | POA: Diagnosis not present

## 2018-06-05 DIAGNOSIS — M05741 Rheumatoid arthritis with rheumatoid factor of right hand without organ or systems involvement: Secondary | ICD-10-CM

## 2018-06-05 DIAGNOSIS — E039 Hypothyroidism, unspecified: Secondary | ICD-10-CM

## 2018-06-05 DIAGNOSIS — E1140 Type 2 diabetes mellitus with diabetic neuropathy, unspecified: Secondary | ICD-10-CM

## 2018-06-05 DIAGNOSIS — M05742 Rheumatoid arthritis with rheumatoid factor of left hand without organ or systems involvement: Secondary | ICD-10-CM

## 2018-06-05 DIAGNOSIS — R55 Syncope and collapse: Secondary | ICD-10-CM | POA: Diagnosis not present

## 2018-06-05 DIAGNOSIS — I82442 Acute embolism and thrombosis of left tibial vein: Secondary | ICD-10-CM | POA: Diagnosis not present

## 2018-06-05 DIAGNOSIS — E079 Disorder of thyroid, unspecified: Secondary | ICD-10-CM | POA: Diagnosis not present

## 2018-06-05 DIAGNOSIS — J849 Interstitial pulmonary disease, unspecified: Secondary | ICD-10-CM | POA: Diagnosis not present

## 2018-06-05 LAB — BASIC METABOLIC PANEL
Anion gap: 9 (ref 5–15)
BUN: 13 mg/dL (ref 8–23)
CO2: 31 mmol/L (ref 22–32)
Calcium: 8.4 mg/dL — ABNORMAL LOW (ref 8.9–10.3)
Chloride: 101 mmol/L (ref 98–111)
Creatinine, Ser: 1.06 mg/dL — ABNORMAL HIGH (ref 0.44–1.00)
GFR calc Af Amer: 59 mL/min — ABNORMAL LOW (ref >60)
GFR calc non Af Amer: 51 mL/min — ABNORMAL LOW (ref >60)
Glucose, Bld: 105 mg/dL — ABNORMAL HIGH (ref 70–99)
Potassium: 4.2 mmol/L (ref 3.5–5.1)
Sodium: 141 mmol/L (ref 135–145)

## 2018-06-05 LAB — GLUCOSE, CAPILLARY
Glucose-Capillary: 93 mg/dL (ref 70–99)
Glucose-Capillary: 77 mg/dL (ref 70–99)
Glucose-Capillary: 101 mg/dL — ABNORMAL HIGH (ref 70–99)

## 2018-06-05 LAB — CBC
HCT: 35.9 % — ABNORMAL LOW (ref 36.0–46.0)
Hemoglobin: 11 g/dL — ABNORMAL LOW (ref 12.0–15.0)
MCH: 30.7 pg (ref 26.0–34.0)
MCHC: 30.6 g/dL (ref 30.0–36.0)
MCV: 100.3 fL — ABNORMAL HIGH (ref 80.0–100.0)
Platelets: 160 10*3/uL (ref 150–400)
RBC: 3.58 MIL/uL — ABNORMAL LOW (ref 3.87–5.11)
RDW: 15.2 % (ref 11.5–15.5)
WBC: 8.4 10*3/uL (ref 4.0–10.5)
nRBC: 0 % (ref 0.0–0.2)

## 2018-06-05 MED ORDER — APIXABAN 5 MG PO TABS: 10.0000 mg | ORAL_TABLET | Freq: Two times a day (BID) | ORAL | 0 refills | Status: DC

## 2018-06-05 MED ORDER — APIXABAN 5 MG PO TABS: 5.0000 mg | ORAL_TABLET | Freq: Two times a day (BID) | ORAL | 0 refills | Status: DC

## 2018-06-05 MED ORDER — FUROSEMIDE 20 MG PO TABS: 60.0000 mg | ORAL_TABLET | Freq: Every day | ORAL | 0 refills | Status: DC

## 2018-06-05 NOTE — Discharge Summary (Addendum)
[name], is a 78 y.o. female  DOB [birth year]  MRN [id number].  Admission date:  06/02/2018  Admitting Physician  [name], MD  Discharge Date:  06/05/2018   Primary MD  Glendale Chard, MD  Recommendations for primary care physician for things to follow:      Admission Diagnosis  cp syncope low blood pressure Principal Problem:   Chest pain with moderate risk of acute coronary syndrome Active Problems:   Dyslipidemia   Thyroid disease   Essential hypertension   Rheumatoid arthritis ([place])   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 (HCC)   Spinal stenosis of lumbar region with radiculopathy   ILD (interstitial lung disease) (HCC)   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)   High risk medication use   Morbid obesity (HCC)   Chronic respiratory failure with hypoxia (HCC)   Acute kidney injury superimposed on chronic kidney disease (HCC)   Hypotension   Depression   GERD (gastroesophageal reflux disease)   Syncope and collapse   Chest pain   Acute lower UTI   DVT (deep venous thrombosis) ([place])  Discharge Diagnosis   Principal Problem:   DVT (deep venous thrombosis) (HCC) Active Problems:   Dyslipidemia   Thyroid disease   Essential hypertension   Chest pain with moderate risk of acute coronary syndrome   Rheumatoid arthritis ([place])   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 (HCC)   Spinal stenosis of lumbar region with radiculopathy   ILD (interstitial lung disease) (HCC)   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 Acute on chronic diastolic (congestive) heart failure ([place])   . Anemia    iron deficiency anemia - secondary to blood loss ( chronic)   . Arthritis    endstage changes bilateral knees/bilateral ankles.   . Asthma   . Carotid artery occlusion   . Chronic fatigue   . Chronic kidney disease   . Closed left hip fracture ([place])   . Clotting disorder ([place])    pt denies this  . Contusion of left knee    due to fall 1/14.  [name] COPD (chronic obstructive pulmonary disease) (HCC)    pulmonary fibrosis  . Depression, reactive   . Diabetes mellitus    type II   . Diastolic dysfunction   . Difficulty in walking   . Family history of heart disease   . Generalized muscle weakness   . Gout   . High cholesterol   . History of falling   . Hypertension   . Hypothyroidism   . Interstitial lung disease ([place])   .  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    . Morbid obesity ([place])   . Morbid obesity ([place])   . Neuromuscular disorder ([place])    diabetic neuropathy   . Normal coronary arteries    cardiac catheterization performed  10/31/14  . RA (rheumatoid arthritis) ([place])    has been off methotreaxte since 10/13.  [name] Rheumatoid arthritis ([place])   . Shortness of breath   . Spinal stenosis of lumbar region   . Thyroid disease   . Unspecified lack of coordination   . URI (upper respiratory infection)     Past Surgical History:  Procedure Laterality Date  . ABDOMINAL HYSTERECTOMY    . BRAIN SURGERY     Gamma knife 10/13. Needs repeat spring  '14  . CARDIAC CATHETERIZATION N/A 10/31/2014   Procedure: Right/Left Heart Cath and Coronary Angiography;  Surgeon: [name], MD;  Location: [facility];  Service: Cardiovascular;  Laterality: N/A;  . ESOPHAGOGASTRODUODENOSCOPY  (EGD) WITH PROPOFOL N/A 09/14/2014   Procedure: ESOPHAGOGASTRODUODENOSCOPY (EGD) WITH PROPOFOL;  Surgeon: [name], MD;  Location: [facility];  Service: Endoscopy;  Laterality: N/A;  . INCISION AND DRAINAGE HIP Left 01/16/2017   Procedure: IRRIGATION AND DEBRIDEMENT LEFT HIP;  Surgeon: [name], MD;  Location: [facility];  Service: Orthopedics;  Laterality: Left;  . INTRAMEDULLARY (IM) NAIL INTERTROCHANTERIC Left 11/29/2016   Procedure: INTRAMEDULLARY (IM) NAIL INTERTROCHANTRIC;  Surgeon: [name], MD;  Location: [facility];  Service: Orthopedics;  Laterality: Left;  . OVARY SURGERY    . SHOULDER SURGERY Left   . TONSILLECTOMY  age 89  . VIDEO BRONCHOSCOPY Bilateral 05/31/2013   Procedure: VIDEO BRONCHOSCOPY WITHOUT FLUORO;  Surgeon: Brand Males, MD;  Location: [facility];  Service: Cardiopulmonary;  Laterality: Bilateral;  . video bronscoscopy  2000   lung       HPI  from the history and physical done on the day of admission:  [name] is a 78 y.o. female with medical history significant for type 2 diabetes, hypertension, hyperlipidemia, interstitial lung disease, arthritis, hypothyroidism, rheumatoid arthritis on long-term opioids, who presented to the ED today after a syncopal episode this morning at her primary care physician's office.  Patient's daughter states that she was helping her mom out of the car and she noticed that her mom's left arm started shaking just before her mom collapsed on her and although eyes were open, she was unresponsive for about 2 to 3 minutes.  Patient had no loss of bowel or bladder control, she has no prior history of seizure.  At her PCP her blood pressure was 86/49.  She was given some p.o. fluids with mild response in her blood pressure.  She was sent to the ED for further evaluation.  She states that her Lasix was increased this past Monday from 80 mg to 120 milligrams a day due to increased BLE edema but that she has  not yet increased her dose because she had taken several laxatives for constipation and wanted to avoid dehydration.  She has been complaining of chest tightness for the past 2 weeks. This is substernal, sometimes just to the R of her sternum, "pressure-like" associated with "an exhausted feeling," occasional SOB. No diaphoresis, palpitations. Occ radiates to her back, not to jaw or arm.  She thought it was due to her lung disease and went to see her pulmonologist but evaluation including imaging  was all within normal limits.  She has had no diaphoresis, shortness of breath, palpitations. She is had no nausea, vomiting, or diarrhea.   Hospital Course:  1. DVT with presumed pulmonary embolus: Patient was found to have positive left great saphenous vein thrombosis.  Chest pain was presumed to be possibly related to a pulmonary embolus and cardiology had signed off on the patient.  No CT angiogram was obtained due to initial decrease in kidney function.  Studies were not obtained thereafter as it did not change her overall plan of treatment.  She was initially placed on heparin drip and changed to p.o. Eliquis.  Risk factors were noted to be obesity and sedimentary lifestyle following left hip surgery 2 years ago.  Commended patient to try to utilize TED hose at discharge   2.  Syncope: Patient was noted to have positive orthostatic on admission and echocardiogram showed EF of 60 to 65% with no wall motion abnormalities noted.  Pulmonary artery pressure was mildly increased.  3.  Acute kidney injury on chronic kidney disease stage III: Baseline creatinine previously noted to be around 1.1, but patient presented with a creatinine of 1.43 with BUN 13.  Patient had initially been given 500 mL bolus on admission and home dose of Lasix 80 mg p.o. daily were discontinued for hospital day 2.  She was restarted on oral Lasix of 40 mg p.o. daily during hospital day 3.  At discharge patient was recommended to take 60 mg  daily p.o. until able to follow-up with primary care provider/cardiology to have repeat blood work and adjust dose if needed at that time.  4.  Urinary tract infection: Ruled out.  Patient had been initially started on Rocephin after urinalysis showed large leukocytes.  Patient denied any complaints of dysuria.  Urine culture grew out multiple bacteria.  Patient had received a total of 2 days of IV Rocephin and thereafter discontinued.  5.  Diabetes mellitus type 2: Patient's last hemoglobin A1c is is 6.4 on 1/8.  Patient was treated with sliding scale insulin while in the hospital.  6.  Essential hypertension: Blood pressures maintained.  Patient had been given a substitution of ibesartan during hospital stay which was decreased 150 mg to 75 mg daily, along with furosemide decreased from 80 mg down to 40 mg daily.  At discharge patient was restarted on home dose of Benicar 20 mg daily and furosemide increased to 47m to 60 mg daily.  7.  Hyperlipidemia: Patient was continued on atorvastatin during her hospital stay.   8.  Hypothyroidism: TSH noted to be one 3.51 on 05/26/2018.  Patient was continued on levothyroxine.  9.  Chronic pain: Patient was continued on home medications of MS Contin and morphine.  10.  Rheumatoid arthritis: Patient was continued on prednisone, Plaquenil, and Tofacitinib  11.  Hypokalemia: Resolved.  Patient initial potassium noted to be as low as 3, but after replacement was noted to be 4.2 prior to discharge.  12. Diabetic neuropathy: Patient was continued on her home dose of gabapentin.  14.  Interstitial lung disease: Stable.  Patient was continued on albuterol nebs as needed.  Continued home azithromycin Monday, Wednesday, and Friday.  13.  Morbid obesity: BMI of 49.19  Follow-up Information    SGlendale Chard MD. Schedule an appointment as soon as possible for a visit in 1 week(s).   Specialty:  Internal Medicine Why:  Please call and make follow-up appointment  within 1 week of discharge hospital Contact information: [phone]  [address] [place] [address] [phone]        Lorretta Harp, MD .   Specialties:  Cardiology, Radiology Contact information: [address] [place] [address] [phone]           Consults obtained: None  Discharge Condition: Stable  Diet and Activity recommendation: See Discharge Instructions below  Discharge Instructions      Discharge Instructions    Discharge instructions   Complete by:  As directed    Follow with Primary MD Glendale Chard, MD in 7 days   Get CBC and BMP checked  by Primary MD in 5-7 days ( we routinely change or add medications that can affect your baseline labs and fluid status, therefore we recommend that you get the mentioned basic workup next visit with your PCP, your PCP may decide not to get them or add new tests based on their clinical decision)  Activity: As tolerated with Full fall precautions use walker/cane & assistance as needed  Disposition Home          Diet heart healthy/carb modified           For Heart failure patients - Check your Weight same time everyday, if you gain over 2 pounds, or you develop in leg swelling, experience more shortness of breath or chest pain, call your Primary doctor immediately. Follow Cardiac Low Salt Diet and 1.5 lit/day fluid restriction.  Special Instructions: If you have smoked or chewed Tobacco  in the last 2 yrs please stop smoking, stop any regular Alcohol  and or any Recreational drug use.  On your next visit with your primary care physician please Get Medicines reviewed and adjusted.  Please request your Glendale Chard, MD to go over all Hospital Tests and Procedure/Radiological results at the follow up, please get all Hospital records sent to your Prim MD by signing hospital release before you go home.  If you experience worsening of your admission symptoms, develop shortness of breath,  life threatening emergency, suicidal or homicidal thoughts you must seek medical attention immediately by calling 911 or calling your MD immediately  if symptoms less severe.  You Must read complete instructions/literature along with all the possible adverse reactions/side effects for all the Medicines you take and that have been prescribed to you. Take any new Medicines after you have completely understood and accpet all the possible adverse reactions/side effects.   Do not drive, operate heavy machinery, perform activities at heights, swimming or participation in water activities or provide baby sitting services if your were admitted for syncope or siezures until you have seen by Primary MD or a Neurologist and advised to do so again.  Do not drive when taking Pain medications.  Do not take more than prescribed Pain, Sleep and Anxiety Medications  Wear Seat belts while driving.   Please note  You were cared for by a hospitalist during your hospital stay. If you have any questions about your discharge medications or the care you received while you were in the hospital after you are discharged, you can call the unit and asked to speak with the hospitalist on call if the hospitalist that took care of you is not available. Once you are discharged, your primary care physician will handle any further medical issues. Please note that NO REFILLS for any discharge medications will be authorized once you are discharged, as it is imperative that you return to your primary care physician (or establish a relationship with a  primary care physician if you do not have one) for your aftercare needs so that they can reassess your need for medications and monitor your lab values.   Increase activity slowly   Complete by:  As directed         Discharge Medications     Allergies as of 06/05/2018      Reactions   Codeine Swelling, Other (See Comments)   Facial swelling Chest pain and swelling in legs Facial  swelling   Infliximab Anaphylaxis   "sent me into shock" "sent me into shock"   Lisinopril Swelling   Face and neck swelling Face and neck swelling      Medication List    STOP taking these medications   polyethylene glycol packet Commonly known as:  MIRALAX / GLYCOLAX     TAKE these medications   acetaminophen 500 MG tablet Commonly known as:  TYLENOL Take 1,000 mg by mouth every 6 (six) hours as needed for moderate pain.   AEROCHAMBER MV inhaler Use as instructed   albuterol 108 (90 Base) MCG/ACT inhaler Commonly known as:  PROVENTIL HFA;VENTOLIN HFA Inhale 2 puffs into the lungs every 6 (six) hours as needed for wheezing or shortness of breath.   allopurinol 300 MG tablet Commonly known as:  ZYLOPRIM Take 300 mg by mouth daily.   apixaban 5 MG Tabs tablet Commonly known as:  ELIQUIS Take 2 tablets (10 mg total) by mouth 2 (two) times daily.   apixaban 5 MG Tabs tablet Commonly known as:  ELIQUIS Take 1 tablet (5 mg total) by mouth 2 (two) times daily. Start taking on:  June 11, 2018   aspirin 325 MG tablet Take 1 tablet (325 mg total) by mouth daily.   atorvastatin 20 MG tablet Commonly known as:  LIPITOR Take 10 mg by mouth every evening.   azithromycin 500 MG tablet Commonly known as:  ZITHROMAX Take 1 tablet by mouth daily. TAKE ON MON, WED & FRI   CALCIUM + D3 PO Take 2 tablets by mouth daily with lunch.   cholecalciferol 1000 units tablet Commonly known as:  VITAMIN D Take 2,000 Units by mouth daily with lunch.   colchicine 0.6 MG tablet Take 0.6 mg by mouth daily.   cyclobenzaprine 5 MG tablet Commonly known as:  FLEXERIL Take 1 tablet (5 mg total) by mouth at bedtime as needed for muscle spasms.   diclofenac sodium 1 % Gel Commonly known as:  VOLTAREN Apply 1 application topically 3 (three) times daily. Both hands and knees   docusate sodium 100 MG capsule Commonly known as:  COLACE Take 1 capsule (100 mg total) by mouth 2 (two)  times daily.   DULoxetine 60 MG capsule Commonly known as:  CYMBALTA Take 1 capsule (60 mg total) by mouth daily.   fluticasone 50 MCG/ACT nasal spray Commonly known as:  FLONASE Place 2 sprays into both nostrils daily.   furosemide 20 MG tablet Commonly known as:  LASIX Take 3 tablets (60 mg total) by mouth daily. What changed:    medication strength  how much to take   gabapentin 600 MG tablet Commonly known as:  NEURONTIN Take two in the morning , Two mid- afternoon and two at bedtime   guaiFENesin 600 MG 12 hr tablet Commonly known as:  MUCINEX Take 1,200 mg by mouth 2 (two) times daily.   hydroxychloroquine 200 MG tablet Commonly known as:  PLAQUENIL Take 200 mg by mouth daily.   ipratropium-albuterol 0.5-2.5 (3) MG/3ML Soln  Commonly known as:  DUONEB Take 3 mLs by nebulization every 6 (six) hours as needed (cough/wheezing).   levothyroxine 50 MCG tablet Commonly known as:  SYNTHROID, LEVOTHROID Take 1 tablet (50 mcg total) by mouth daily.   metoprolol succinate 100 MG 24 hr tablet Commonly known as:  TOPROL XL Take 1/2 tablet by mouth daily with or immediately following a meal.   morphine 15 MG 12 hr tablet Commonly known as:  MS CONTIN Take 1 tablet (15 mg total) by mouth every 12 (twelve) hours.   morphine 15 MG tablet Commonly known as:  MSIR Take 1 tablet (15 mg total) by mouth 2 (two) times daily as needed for moderate pain ([facility] Pain).   multivitamin with minerals Tabs tablet Take 1 tablet by mouth daily.   olmesartan 20 MG tablet Commonly known as:  BENICAR Take 1 tablet (20 mg total) by mouth daily.   OXYGEN Inhale 2 L into the lungs at bedtime. And on exertion   pantoprazole 40 MG tablet Commonly known as:  PROTONIX take 1 tablet by mouth once daily MINUTES BEFORE 1ST MEAL OF THE DAY   potassium chloride 10 MEQ tablet Commonly known as:  K-DUR,KLOR-CON Take 10 mEq by mouth daily.   predniSONE 5 MG tablet Commonly known as:   DELTASONE Take 12.5 mg by mouth every morning.   Semaglutide(0.25 or 0.5MG/DOS) 2 MG/1.5ML Sopn Commonly known as:  OZEMPIC (0.25 OR 0.5 MG/DOSE) Inject 0.5 mg into the skin once a week.   sodium chloride 0.65 % Soln nasal spray Commonly known as:  OCEAN Place 1 spray into both nostrils as needed for congestion.   Tofacitinib Citrate 5 MG Tabs Commonly known as:  XELJANZ Take 1 tablet (5 mg total) by mouth 2 (two) times daily.       Major procedures and Radiology Reports - PLEASE review detailed and final reports for all details, in brief -     Dg Chest 2 View  Result Date: 06/02/2018 CLINICAL DATA:  Chest pain and pressure EXAM: CHEST - 2 VIEW COMPARISON:  11/29/2016 FINDINGS: Cardiac shadows within normal limits. Lungs are well aerated bilaterally without focal infiltrate or sizable effusion. No acute bony abnormality is noted. Chronic degenerative changes about the right shoulder joint are seen. IMPRESSION: No acute abnormality noted. Electronically Signed   By: Inez Catalina M.D.   On: 06/02/2018 13:32   Vas [place] Lower Extremity Venous (dvt)  Result Date: 06/03/2018  Lower Venous Study Indications: Pain, Swelling, Edema, and R/o DVT.  Limitations: Body habitus. Comparison Study: Venous study on 04/18/2015 with negative result. Performing Technologist: Rudell Cobb  Examination Guidelines: A complete evaluation includes B-mode imaging, spectral Doppler, color Doppler, and power Doppler as needed of all accessible portions of each vessel. Bilateral testing is considered an integral part of a complete examination. Limited examinations for reoccurring indications may be performed as noted.  Right Venous Findings: +---------+---------------+---------+-----------+----------+-------------------+          CompressibilityPhasicitySpontaneityPropertiesSummary             +---------+---------------+---------+-----------+----------+-------------------+ CFV      Full           Yes      Yes                                       +---------+---------------+---------+-----------+----------+-------------------+ SFJ      Full                                                             +---------+---------------+---------+-----------+----------+-------------------+  FV Prox  Full                                                             +---------+---------------+---------+-----------+----------+-------------------+ FV Mid   Full                                                             +---------+---------------+---------+-----------+----------+-------------------+ FV Distal               Yes      Yes                  not well visualized +---------+---------------+---------+-----------+----------+-------------------+ PFV      Full                                                             +---------+---------------+---------+-----------+----------+-------------------+ POP      Full           Yes      Yes                                      +---------+---------------+---------+-----------+----------+-------------------+ PTV                              Yes                  not well visualized +---------+---------------+---------+-----------+----------+-------------------+ PERO                             Yes                  not well visualized +---------+---------------+---------+-----------+----------+-------------------+  Left Venous Findings: +---------+---------------+---------+-----------+----------+-------------------+          CompressibilityPhasicitySpontaneityPropertiesSummary             +---------+---------------+---------+-----------+----------+-------------------+ CFV      Full           Yes      Yes                                      +---------+---------------+---------+-----------+----------+-------------------+ SFJ      Full                                                              +---------+---------------+---------+-----------+----------+-------------------+ FV Prox  Full                                                             +---------+---------------+---------+-----------+----------+-------------------+  FV Mid   Full                                                             +---------+---------------+---------+-----------+----------+-------------------+ FV DistalFull                                                             +---------+---------------+---------+-----------+----------+-------------------+ PFV      Full                                                             +---------+---------------+---------+-----------+----------+-------------------+ POP      Full           Yes      Yes                                      +---------+---------------+---------+-----------+----------+-------------------+ PTV      Partial                 No                   Age Indeterminate   +---------+---------------+---------+-----------+----------+-------------------+ PERO                                                  Not well visualized +---------+---------------+---------+-----------+----------+-------------------+ GSV      None                                         Acute               +---------+---------------+---------+-----------+----------+-------------------+  Left Technical Findings: GSV on calf region: thrombosed.   Summary: Right: There is no evidence of deep vein thrombosis in the lower extremity. However, portions of this examination were limited- see technologist comments above. No cystic structure found in the popliteal fossa. Left: Findings consistent with acute superficial vein thrombosis involving the left great saphenous vein. Findings consistent with age indeterminate deep vein thrombosis involving the left posterior tibial vein. No cystic structure found in the popliteal  fossa.  *See table(s) above for  measurements and observations. Electronically signed by Servando Snare MD on 06/03/2018 at 3:30:21 PM.    Final     Micro Results   Recent Results (from the past 240 hour(s))  Culture, Urine     Status: None   Collection Time: 06/02/18 10:57 PM  Result Value Ref Range Status   Specimen Description URINE, CLEAN CATCH  Final   Special Requests   Final    NONE Performed at [facility], [address] [place], [place] [address]  Culture   Final    Multiple bacterial morphotypes present, none predominant. Suggest appropriate recollection if clinically indicated.   Report Status 06/04/2018 FINAL  Final       Today   Subjective    [name] today reports that the pain in her legs is been improving.  She normally is on oxygen at night. Objective   Blood pressure 113/69, pulse 95, temperature 97.7 F (36.5 C), temperature source Oral, resp. rate 15, height _0  (1.575 m), weight 126.5 kg, SpO2 96 %.   Intake/Output Summary (Last 24 hours) at 06/05/2018 1101 Last data filed at 06/04/2018 2100 Gross per 24 hour  Intake 360 ml  Output -  Net 360 ml    Exam  Constitutional: NAD, calm, comfortable Eyes: PERRL, lids and conjunctivae normal ENMT: Mucous membranes are moist. Posterior pharynx clear of any exudate or lesions.  Neck: normal, supple, no masses, no thyromegaly Respiratory: Minimally diminished aeration with no significant wheezes appreciated.  Normal respiratory effort. No accessory muscle use.  Patient currently on 1 L of nasal cannula oxygen.  Able to speak in complete sentences. Cardiovascular: Regular rate and rhythm, no murmurs / rubs / gallops.  Trace lower extremity edema. 2+ pedal pulses. No carotid bruits.  Abdomen: no tenderness, no masses palpated. No hepatosplenomegaly. Bowel sounds positive.  Musculoskeletal: no clubbing / cyanosis. No joint deformity upper and lower extremities. Good ROM, no contractures. Normal muscle tone.  Skin: Mild venous  stasis changes noted of the left lower extremity. Neurologic: CN 2-12 grossly intact. Sensation intact, DTR normal. Strength 5/5 in all 4.  Psychiatric: Normal judgment and insight. Alert and oriented x 3. Normal mood.    Data Review   CBC w Diff:  Lab Results  Component Value Date   WBC 8.4 06/05/2018   HGB 11.0 (L) 06/05/2018   HGB 11.7 02/22/2018   HGB 12.4 02/17/2012   HCT 35.9 (L) 06/05/2018   HCT 35.9 02/22/2018   HCT 37.2 02/17/2012   PLT 160 06/05/2018   PLT 205 02/22/2018   LYMPHOPCT 35 11/29/2016   LYMPHOPCT 18.1 02/17/2012   MONOPCT 5 11/29/2016   MONOPCT 6.5 02/17/2012   EOSPCT 5 11/29/2016   EOSPCT 4.8 02/17/2012   BASOPCT 0 11/29/2016   BASOPCT 0.5 02/17/2012    CMP:  Lab Results  Component Value Date   NA 141 06/05/2018   NA 148 (H) 05/26/2018   NA 139 02/17/2012   K 4.2 06/05/2018   K 4.8 02/17/2012   CL 101 06/05/2018   CL 103 02/17/2012   CO2 31 06/05/2018   CO2 25 02/17/2012   BUN 13 06/05/2018   BUN 20 05/26/2018   BUN 32.0 (H) 02/17/2012   CREATININE 1.06 (H) 06/05/2018   CREATININE 1.57 (H) 09/17/2016   CREATININE 1.7 (H) 02/17/2012   GLU 84 04/30/2015   PROT 6.1 05/26/2018   PROT 6.5 02/17/2012   ALBUMIN 4.3 05/26/2018   ALBUMIN 3.3 (L) 02/17/2012   BILITOT 0.7 05/26/2018   BILITOT 0.70 02/17/2012   ALKPHOS 82 05/26/2018   ALKPHOS 59 02/17/2012   AST 30 05/26/2018   AST 16 02/17/2012   ALT 54 (H) 05/26/2018   ALT 26 02/17/2012  .   Total Time in preparing paper work, data evaluation and todays exam - 35 minutes  Norval Morton M.D on 06/05/2018 at [facility]  [phone]

## 2018-06-05 NOTE — Progress Notes (Signed)
Discharge information discussed with patient and daughter.  This included, but was not limited to, the following;  Medications, diagnosis and treatment plan, when to call the MD, activity level, dietary recommendations, recommendations for constipation, pain management,  information on DVTs and Eliquis, etc.  Patient responded well without oxygen at rest with oxygen sats of 94-95 at rest and 90-94 with activity.  Requires frequent rest periods due to SOB.  Medications received from pharmacy.  Patient discharged to private residence via private automobile with daughter.  Escorted to exit via wheelchair by nurse tech.

## 2018-06-05 NOTE — Evaluation (Signed)
Physical Therapy Evaluation Patient Details Name: [name] MRN: [id number] DOB: [birth year] Today's Date: 06/05/2018   History of Present Illness  Pt is a 78 y/o female admitted secondary to syncopal episode and chest pain, troponins were negative. Pt found to be orthostatic and positive for DVT and presumed PE. Pt was started on a heparin drip. PMH including but not limited to DM, HTN, CHF, diabetic neuropathy and CKD.    Clinical Impression  Pt presented seated OOB in recliner chair, awake and willing to participate in therapy session. Prior to admission, pt reported that she ambulated with use of RW and was independent with ADLs. Pt currently at min guard level for transfers and ambulation with RW. However, pt limited with ambulation distance secondary to fatigue and increased WOB. Pt on RA throughout with SPO2 maintaining >90%. Pt would continue to benefit from skilled physical therapy services at this time while admitted and after d/c to address the below listed limitations in order to improve overall safety and independence with functional mobility.     Follow Up Recommendations Home health PT    Equipment Recommendations  None recommended by PT    Recommendations for Other Services       Precautions / Restrictions Precautions Precautions: Fall Restrictions Weight Bearing Restrictions: No      Mobility  Bed Mobility               General bed mobility comments: pt seated OOB in recliner chair upon arrival  Transfers Overall transfer level: Needs assistance Equipment used: Rolling walker (2 wheeled) Transfers: Sit to/from Stand Sit to Stand: Min guard         General transfer comment: min guard for safety, good technique utilized  Ambulation/Gait Ambulation/Gait assistance: Counsellor (Feet): 50 Feet(50' x2 with standing rest breaks required) Assistive device: Rolling walker (2 wheeled) Gait Pattern/deviations: Step-through  pattern;Decreased step length - right;Decreased step length - left;Decreased stride length;Shuffle Gait velocity: decreased   General Gait Details: pt generally steady with RW; however, very slow and guarded gait and fatigues very quickly  Science writer    Modified Rankin (Stroke Patients Only)       Balance Overall balance assessment: Needs assistance Sitting-balance support: Feet supported Sitting balance-Leahy Scale: Good     Standing balance support: Bilateral upper extremity supported Standing balance-Leahy Scale: Poor                               Pertinent Vitals/Pain Pain Assessment: No/denies pain    Home Living Family/patient expects to be discharged to:: Private residence Living Arrangements: Spouse/significant other;Children Available Help at Discharge: Family;Friend(s);Available PRN/intermittently Type of Home: House Home Access: Stairs to enter   Entrance Stairs-Number of Steps: 1 Home Layout: Able to live on main level with bedroom/bathroom Home Equipment: Cane - single point;Walker - 2 wheels;Wheelchair - manual;Shower seat      Prior Function Level of Independence: Independent with assistive device(s)         Comments: ambulates with RW and uses a w/c for long distances     Hand Dominance        Extremity/Trunk Assessment   Upper Extremity Assessment Upper Extremity Assessment: Overall WFL for tasks assessed    Lower Extremity Assessment Lower Extremity Assessment: Generalized weakness       Communication   Communication: No difficulties  Cognition Arousal/Alertness: Awake/alert  Behavior During Therapy: WFL for tasks assessed/performed Overall Cognitive Status: Within Functional Limits for tasks assessed                                        General Comments      Exercises     Assessment/Plan    PT Assessment Patient needs continued PT services  PT Problem List  Decreased activity tolerance;Decreased balance;Decreased mobility;Decreased coordination;Decreased knowledge of use of DME;Decreased safety awareness;Decreased knowledge of precautions       PT Treatment Interventions DME instruction;Gait training;Functional mobility training;Therapeutic activities;Therapeutic exercise;Stair training;Balance training;Neuromuscular re-education;Patient/family education    PT Goals (Current goals can be found in the Care Plan section)  Acute Rehab PT Goals Patient Stated Goal: to build up endurance and strength PT Goal Formulation: With patient/family Time For Goal Achievement: 06/19/18 Potential to Achieve Goals: Good    Frequency Min 3X/week   Barriers to discharge        Co-evaluation               AM-PAC PT "6 Clicks" Mobility  Outcome Measure Help needed turning from your back to your side while in a flat bed without using bedrails?: None Help needed moving from lying on your back to sitting on the side of a flat bed without using bedrails?: None Help needed moving to and from a bed to a chair (including a wheelchair)?: None Help needed standing up from a chair using your arms (e.g., wheelchair or bedside chair)?: None Help needed to walk in hospital room?: None Help needed climbing 3-5 steps with a railing? : A Lot 6 Click Score: 22    End of Session Equipment Utilized During Treatment: Gait belt Activity Tolerance: Patient limited by fatigue Patient left: in chair;with call bell/phone within reach;with family/visitor present Nurse Communication: Mobility status PT Visit Diagnosis: Other abnormalities of gait and mobility (R26.89)    Time: 2182-8833 PT Time Calculation (min) (ACUTE ONLY): 24 min   Charges:   PT Evaluation $PT Eval Moderate Complexity: 1 Mod PT Treatments $Gait Training: 8-22 mins        Sherie Don, PT, DPT  Acute Rehabilitation Services Pager [phone] Office [facility] 06/05/2018, 3:27 PM

## 2018-06-05 NOTE — Care Management (Signed)
Pt prescribed Eliquis upon d/c.  Checked with patient and Xarelto card had been given.  Switched to Eliquis 30 day free card.  Two eliquis prescriptions were sent to mail order pharmacy.  Dr. Tamala Julian states he called in a script to [facility] on [address].

## 2018-06-07 NOTE — Progress Notes (Signed)
COMMUNITY PALLIATIVE CARE RN NOTE  PATIENT NAME: [name] DOB: 1940-11-24 MRN: [id number]  PRIMARY CARE PROVIDER: Glendale Chard, MD  RESPONSIBLE PARTY:  Acct ID - Guarantor Home Phone Work Phone Relationship Acct Type  [id number] [name], [phone] Self P/F     [address], [name], [place] [address]    PLAN OF CARE and INTERVENTION:  1. ADVANCE CARE PLANNING/GOALS OF CARE: She wants to remain at home with her husband 2. PATIENT/CAREGIVER EDUCATION: Reinforced Safe Mobility/Transfers and Pain Management 3. DISEASE STATUS: Joint visit made with Palliative Care SW, Lynn Duffy. Met with patient in her home. She is awake and alert eating her lunch. She reports pain 6/10 in her lower back and has seemed to bother her more lately. She also c/o pain in bilateral knees and legs. She continues on her Morphine ER and IR. She states that for her Rheumatoid Arthritis that her Allopurinol was increased to 317m and she was started on Zelanz. She has an appointment at the Pain Clinic tomorrow. She had a recent appointment with her Pulmonologist. Patient reports that despite taking her Lasix 831mon a daily basis, she is noticing increased fluid build up in her legs/feet and abdomen. She was placed on an additional 40 mg dose of Lasix to see if this helps and is being referred to a Cardiologist. Her Prednisone was decreased to 12.5 mg and if she does well with this dose, it will decrease to 10 mg in 2 weeks. No dyspnea noted during visit, but does occur with exertion. She continues to wear Oxygen at 2L/min via [place] during the night and PRN during the day. Her daughter and son were present towards the end of the visit and reports noticing more mood swings and agitation in patient. They admit that their family has had some recent stressful events to occur, which is contributing to her change in mood. She is also being referred to a Cognitive-Behavioral specialist. Will continue to  monitor.  HISTORY OF PRESENT ILLNESS:  This is a [age]o female who resides at home with her husband. Palliative Care Team continues to follow patient. Next visit scheduled in 1 month.  CODE STATUS: DNR ADVANCED DIRECTIVES: N MOST FORM: no PPS: 50%   PHYSICAL EXAM:   VITALS: Today's Vitals   06/01/18 1328  BP: 103/64  Pulse: 93  Resp: 18  Temp: (!) 97.1 F (36.2 C)  TempSrc: Temporal  SpO2: 93%  PainSc: 6   PainLoc: Back    LUNGS: clear to auscultation  CARDIAC: Cor RRR EXTREMITIES: 3+ and pitting edema to bilateral lower extremities SKIN: Exposed skin is dry and intact  NEURO: Alert and oriented x 3, pleasant and engaging, ambulatory with walker   (Duration of visit and documentation 90 minutes)    MoDaryl EasternRN, BSN

## 2018-06-09 ENCOUNTER — Ambulatory Visit: Payer: Medicare Other

## 2018-06-11 ENCOUNTER — Encounter: Payer: Self-pay | Admitting: Nurse Practitioner

## 2018-06-11 ENCOUNTER — Ambulatory Visit (INDEPENDENT_AMBULATORY_CARE_PROVIDER_SITE_OTHER): Payer: Medicare Other | Admitting: Nurse Practitioner

## 2018-06-11 ENCOUNTER — Other Ambulatory Visit: Payer: Self-pay

## 2018-06-11 VITALS — BP 138/86 | HR 107 | Temp 98.0°F | Resp 16 | Ht 62.0 in | Wt 275.0 lb

## 2018-06-11 DIAGNOSIS — K5903 Drug induced constipation: Secondary | ICD-10-CM | POA: Diagnosis not present

## 2018-06-11 DIAGNOSIS — Z09 Encounter for follow-up examination after completed treatment for conditions other than malignant neoplasm: Secondary | ICD-10-CM | POA: Diagnosis not present

## 2018-06-11 DIAGNOSIS — I824Z2 Acute embolism and thrombosis of unspecified deep veins of left distal lower extremity: Secondary | ICD-10-CM | POA: Diagnosis not present

## 2018-06-11 MED ORDER — LINACLOTIDE 72 MCG PO CAPS: 72.0000 ug | ORAL_CAPSULE | Freq: Every day | ORAL | 1 refills | Status: DC

## 2018-06-11 NOTE — Progress Notes (Addendum)
Subjective:     Patient ID: [name] , female    DOB: May 04, 1941 , 78 y.o.   MRN: [id number]   Chief Complaint  Patient presents with  . Hospitalization Follow-up    HPI  Here for her hospital follow up after going to the Pain clinic and she passed out her blood pressure was in the 80's.  At that time she was sent to the ER for further evaluation. She had been increased on her lasix from 80 mg to 120 mg daily.  She also admits to having taking a laxative and had diarrhea prior to the ER visit.  While in the hospital she was found to have a lower urinary tract infection and 2 blood clots in her left leg lower leg.    Been home since Saturday evening, fatigue, she can hear wheezing when she is home and using albuterol daily. Next appt with kidney on 06/16/2018. [facility]. 42cm left leg 44.5 cm right leg - is always larger    Past Medical History:  Diagnosis Date  . Acute on chronic diastolic (congestive) heart failure ([place])   . Anemia    iron deficiency anemia - secondary to blood loss ( chronic)   . Arthritis    endstage changes bilateral knees/bilateral ankles.   . Asthma   . Carotid artery occlusion   . Chronic fatigue   . Chronic kidney disease   . Closed left hip fracture ([place])   . Clotting disorder ([place])    pt denies this  . Contusion of left knee    due to fall 1/14.  [name] COPD (chronic obstructive pulmonary disease) (HCC)    pulmonary fibrosis  . Depression, reactive   . Diabetes mellitus    type II   . Diastolic dysfunction   . Difficulty in walking   . Family history of heart disease   . Generalized muscle weakness   . Gout   . High cholesterol   . History of falling   . Hypertension   . Hypothyroidism   . Interstitial lung disease ([place])   . 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    . Morbid obesity ([place])   . Morbid obesity ([place])   . Neuromuscular disorder  ([place])    diabetic neuropathy   . Normal coronary arteries    cardiac catheterization performed  10/31/14  . RA (rheumatoid arthritis) ([place])    has been off methotreaxte since 10/13.  [name] Rheumatoid arthritis ([place])   . Shortness of breath   . Spinal stenosis of lumbar region   . Thyroid disease   . Unspecified lack of coordination   . URI (upper respiratory infection)      Family History  Problem Relation Age of Onset  . Diabetes Mother   . Heart attack Mother   . Hypertension Father   . Lung cancer Father   . Diabetes Sister   . Diabetes Brother   . Hypertension Brother   . Heart disease Brother   . Heart attack Brother   . Kidney cancer Brother   . Uterine cancer Daughter   . Breast cancer Sister   . Rheum arthritis Maternal Uncle   . Gout Brother   . Kidney failure Brother        x 5     Current Outpatient Medications:  .  acetaminophen (TYLENOL) 500 MG tablet, Take 1,000 mg by mouth every 6 (  six) hours as needed for moderate pain. , Disp: , Rfl:  .  albuterol (PROVENTIL HFA;VENTOLIN HFA) 108 (90 BASE) MCG/ACT inhaler, Inhale 2 puffs into the lungs every 6 (six) hours as needed for wheezing or shortness of breath., Disp: , Rfl:  .  allopurinol (ZYLOPRIM) 300 MG tablet, Take 300 mg by mouth daily., Disp: , Rfl:  .  apixaban (ELIQUIS) 5 MG TABS tablet, Take 2 tablets (10 mg total) by mouth 2 (two) times daily., Disp: 11 tablet, Rfl: 0 .  apixaban (ELIQUIS) 5 MG TABS tablet, Take 1 tablet (5 mg total) by mouth 2 (two) times daily., Disp: 60 tablet, Rfl: 0 .  aspirin 325 MG tablet, Take 1 tablet (325 mg total) by mouth daily., Disp: 60 tablet, Rfl: 0 .  atorvastatin (LIPITOR) 20 MG tablet, Take 10 mg by mouth every evening. , Disp: , Rfl:  .  azithromycin (ZITHROMAX) 500 MG tablet, Take 1 tablet by mouth daily. TAKE ON MON, WED & FRI, Disp: , Rfl: 0 .  Calcium Carb-Cholecalciferol (CALCIUM + D3 PO), Take 2 tablets by mouth daily with lunch. , Disp: , Rfl:  .  cholecalciferol  (VITAMIN D) 1000 UNITS tablet, Take 2,000 Units by mouth daily with lunch. , Disp: , Rfl:  .  colchicine 0.6 MG tablet, Take 0.6 mg by mouth daily., Disp: , Rfl:  .  cyclobenzaprine (FLEXERIL) 5 MG tablet, Take 1 tablet (5 mg total) by mouth at bedtime as needed for muscle spasms., Disp: 90 tablet, Rfl: 3 .  diclofenac sodium (VOLTAREN) 1 % GEL, Apply 1 application topically 3 (three) times daily. Both hands and knees, Disp: 3 Tube, Rfl: 4 .  docusate sodium (COLACE) 100 MG capsule, Take 1 capsule (100 mg total) by mouth 2 (two) times daily., Disp: 10 capsule, Rfl: 0 .  DULoxetine (CYMBALTA) 60 MG capsule, Take 1 capsule (60 mg total) by mouth daily., Disp: 90 capsule, Rfl: 3 .  fluticasone (FLONASE) 50 MCG/ACT nasal spray, Place 2 sprays into both nostrils daily., Disp: 16 g, Rfl: 3 .  furosemide (LASIX) 20 MG tablet, Take 3 tablets (60 mg total) by mouth daily., Disp: 30 tablet, Rfl: 0 .  gabapentin (NEURONTIN) 600 MG tablet, Take two in the morning , Two mid- afternoon and two at bedtime, Disp: 540 tablet, Rfl: 3 .  guaiFENesin (MUCINEX) 600 MG 12 hr tablet, Take 1,200 mg by mouth 2 (two) times daily., Disp: , Rfl:  .  hydroxychloroquine (PLAQUENIL) 200 MG tablet, Take 200 mg by mouth daily., Disp: , Rfl:  .  ipratropium-albuterol (DUONEB) 0.5-2.5 (3) MG/3ML SOLN, Take 3 mLs by nebulization every 6 (six) hours as needed (cough/wheezing). , Disp: , Rfl:  .  levothyroxine (SYNTHROID, LEVOTHROID) 50 MCG tablet, Take 1 tablet (50 mcg total) by mouth daily., Disp: 90 tablet, Rfl: 1 .  metoprolol succinate (TOPROL XL) 100 MG 24 hr tablet, Take 1/2 tablet by mouth daily with or immediately following a meal., Disp: 45 tablet, Rfl: 1 .  morphine (MS CONTIN) 15 MG 12 hr tablet, Take 1 tablet (15 mg total) by mouth every 12 (twelve) hours., Disp: 50 tablet, Rfl: 0 .  morphine (MSIR) 15 MG tablet, Take 1 tablet (15 mg total) by mouth 2 (two) times daily as needed for moderate pain ([facility])., Disp:  50 tablet, Rfl: 0 .  Multiple Vitamin (MULTIVITAMIN WITH MINERALS) TABS, Take 1 tablet by mouth daily. , Disp: , Rfl:  .  olmesartan (BENICAR) 20 MG tablet, Take 1 tablet (20  mg total) by mouth daily., Disp: 90 tablet, Rfl: 1 .  OXYGEN-HELIUM IN, Inhale 2 L into the lungs at bedtime. And on exertion, Disp: , Rfl:  .  pantoprazole (PROTONIX) 40 MG tablet, take 1 tablet by mouth once daily MINUTES BEFORE 1ST MEAL OF THE DAY, Disp: 90 tablet, Rfl: 1 .  potassium chloride (K-DUR,KLOR-CON) 10 MEQ tablet, Take 10 mEq by mouth daily. , Disp: , Rfl:  .  predniSONE (DELTASONE) 5 MG tablet, Take 12.5 mg by mouth every morning. , Disp: , Rfl: 0 .  Semaglutide,0.25 or 0.5MG/DOS, (OZEMPIC, 0.25 OR 0.5 MG/DOSE,) 2 MG/1.5ML SOPN, Inject 0.5 mg into the skin once a week., Disp: 3 pen, Rfl: 1 .  sodium chloride (OCEAN) 0.65 % SOLN nasal spray, Place 1 spray into both nostrils as needed for congestion., Disp: , Rfl:  .  Spacer/Aero-Holding Chambers (AEROCHAMBER MV) inhaler, Use as instructed, Disp: 1 each, Rfl: 0 .  Tofacitinib Citrate (XELJANZ) 5 MG TABS, Take 1 tablet (5 mg total) by mouth 2 (two) times daily., Disp: 180 tablet, Rfl: 1   Allergies  Allergen Reactions  . Codeine Swelling and Other (See Comments)    Facial swelling Chest pain and swelling in legs  Facial swelling  . Infliximab Anaphylaxis    "sent me into shock" "sent me into shock"  . Lisinopril Swelling    Face and neck swelling Face and neck swelling     Review of Systems   Today's Vitals   06/11/18 1509  BP: 138/86  Pulse: (!) 107  Resp: 16  Temp: 98 F (36.7 C)  TempSrc: Oral  SpO2: 93%  Weight: 275 lb (124.7 kg)  Height: _0  (1.575 m)   Body mass index is 50.3 kg/m.   Objective:  Physical Exam Vitals signs reviewed.  Constitutional:      Appearance: She is well-developed.  HENT:     Head: Normocephalic and atraumatic.  Eyes:     Pupils: Pupils are equal, round, and reactive to light.  Cardiovascular:      Rate and Rhythm: Normal rate and regular rhythm.     Pulses: Normal pulses.     Heart sounds: Normal heart sounds. No murmur.  Pulmonary:     Effort: Pulmonary effort is normal.     Breath sounds: Normal breath sounds.  Musculoskeletal:        General: Tenderness: cervical and low back.  Skin:    General: Skin is warm and dry.     Capillary Refill: Capillary refill takes less than 2 seconds.  Neurological:     General: No focal deficit present.     Mental Status: She is alert and oriented to person, place, and time.     Cranial Nerves: No cranial nerve deficit.         Assessment And Plan:     1. Acute deep vein thrombosis (DVT) of distal vein of left lower extremity (HCC)  Here today for hospital follow up admission  Currently being treated with eliquis twice a day  Left lower extremity swollen and mildly tender Admitted 1/15 - 1/18 to [facility]. TCM Performed. A member of the clinical team spoke with the patient upon dischare. Discharge summary was reviewed in full detail during the visit. Meds reconciled and compared to discharge meds. Medication list is updated and reviewed with the patient.  Greater than 50% face to face time was spent in counseling an coordination of care.  All questions were answered to the satisfaction of the  patient.  I will send a referral for Home Health to include physical therapy.  - BMP8+eGFR  2. Drug-induced constipation  She had been taking a laxative which may have caused her hypotension  She is on pain medications (Morphine) regularly which increases her risk for constipation  I will start her on linzess to help and advised to avoid taking a laxative.      Minette Brine, FNP

## 2018-06-11 NOTE — Patient Instructions (Addendum)
Constipation, Adult Constipation is when a person:  Poops (has a bowel movement) fewer times in a week than normal.  Has a hard time pooping.  Has poop that is dry, hard, or bigger than normal. Follow these instructions at home: Eating and drinking   Eat foods that have a lot of fiber, such as: ? Fresh fruits and vegetables. ? Whole grains. ? Beans.  Eat less of foods that are high in fat, low in fiber, or overly processed, such as: ? [place] fries. ? Hamburgers. ? Cookies. ? Candy. ? Soda.  Drink enough fluid to keep your pee (urine) clear or pale yellow. General instructions  Exercise regularly or as told by your doctor.  Go to the restroom when you feel like you need to poop. Do not hold it in.  Take over-the-counter and prescription medicines only as told by your doctor. These include any fiber supplements.  Do pelvic floor retraining exercises, such as: ? Doing deep breathing while relaxing your lower belly (abdomen). ? Relaxing your pelvic floor while pooping.  Watch your condition for any changes.  Keep all follow-up visits as told by your doctor. This is important. Contact a doctor if:  You have pain that gets worse.  You have a fever.  You have not pooped for 4 days.  You throw up (vomit).  You are not hungry.  You lose weight.  You are bleeding from the anus.  You have thin, pencil-like poop (stool). Get help right away if:  You have a fever, and your symptoms suddenly get worse.  You leak poop or have blood in your poop.  Your belly feels hard or bigger than normal (is bloated).  You have very bad belly pain.  You feel dizzy or you faint. This information is not intended to replace advice given to you by your health care provider. Make sure you discuss any questions you have with your health care provider. Document Released: 10/22/2007 Document Revised: 11/23/2015 Document Reviewed: 10/24/2015 Elsevier Interactive Patient  Education  [employer].   If you have lab work done today you will be contacted with your lab results within the next 2 weeks.  If you have not heard from [place] then please contact us. The fastest way to get your results is to register for My Chart.   IF you received an x-ray today, you will receive an invoice from [facility]. Please contact [facility] at [phone] with questions or concerns regarding your invoice.   IF you received labwork today, you will receive an invoice from [facility]. Please contact [facility] at [phone] with questions or concerns regarding your invoice.   Our billing staff will not be able to assist you with questions regarding bills from these companies.  You will be contacted with the lab results as soon as they are available. The fastest way to get your results is to activate your My Chart account. Instructions are located on the last page of this paperwork. If you have not heard from [place] regarding the results in 2 weeks, please contact this office.

## 2018-06-12 LAB — BMP8+EGFR
BUN/Creatinine Ratio: 14 (ref 12–28)
BUN: 17 mg/dL (ref 8–27)
CO2: 28 mmol/L (ref 20–29)
CREATININE: 1.22 mg/dL — AB (ref 0.57–1.00)
Calcium: 8.7 mg/dL (ref 8.7–10.3)
Chloride: 99 mmol/L (ref 96–106)
GFR calc Af Amer: 50 mL/min/{1.73_m2} — ABNORMAL LOW (ref >59)
GFR calc non Af Amer: 43 mL/min/{1.73_m2} — ABNORMAL LOW (ref >59)
Glucose: 124 mg/dL — ABNORMAL HIGH (ref 65–99)
Potassium: 4.6 mmol/L (ref 3.5–5.2)
Sodium: 143 mmol/L (ref 134–144)

## 2018-06-15 ENCOUNTER — Other Ambulatory Visit: Payer: Self-pay

## 2018-06-15 ENCOUNTER — Encounter: Payer: Self-pay | Admitting: Nurse Practitioner

## 2018-06-15 DIAGNOSIS — K5903 Drug induced constipation: Secondary | ICD-10-CM

## 2018-06-15 MED ORDER — LINACLOTIDE 72 MCG PO CAPS: 72.0000 ug | ORAL_CAPSULE | Freq: Every day | ORAL | 1 refills | Status: DC

## 2018-06-16 DIAGNOSIS — N2581 Secondary hyperparathyroidism of renal origin: Secondary | ICD-10-CM | POA: Diagnosis not present

## 2018-06-16 DIAGNOSIS — E785 Hyperlipidemia, unspecified: Secondary | ICD-10-CM | POA: Diagnosis not present

## 2018-06-16 DIAGNOSIS — E039 Hypothyroidism, unspecified: Secondary | ICD-10-CM | POA: Diagnosis not present

## 2018-06-16 DIAGNOSIS — D329 Benign neoplasm of meninges, unspecified: Secondary | ICD-10-CM | POA: Diagnosis not present

## 2018-06-16 DIAGNOSIS — M069 Rheumatoid arthritis, unspecified: Secondary | ICD-10-CM | POA: Diagnosis not present

## 2018-06-16 DIAGNOSIS — E1122 Type 2 diabetes mellitus with diabetic chronic kidney disease: Secondary | ICD-10-CM | POA: Diagnosis not present

## 2018-06-16 DIAGNOSIS — I5189 Other ill-defined heart diseases: Secondary | ICD-10-CM | POA: Diagnosis not present

## 2018-06-16 DIAGNOSIS — J849 Interstitial pulmonary disease, unspecified: Secondary | ICD-10-CM | POA: Diagnosis not present

## 2018-06-16 DIAGNOSIS — D631 Anemia in chronic kidney disease: Secondary | ICD-10-CM | POA: Diagnosis not present

## 2018-06-16 DIAGNOSIS — N183 Chronic kidney disease, stage 3 (moderate): Secondary | ICD-10-CM | POA: Diagnosis not present

## 2018-06-16 DIAGNOSIS — I129 Hypertensive chronic kidney disease with stage 1 through stage 4 chronic kidney disease, or unspecified chronic kidney disease: Secondary | ICD-10-CM | POA: Diagnosis not present

## 2018-06-17 ENCOUNTER — Other Ambulatory Visit: Payer: Self-pay | Admitting: Nurse Practitioner

## 2018-06-17 DIAGNOSIS — I824Z2 Acute embolism and thrombosis of unspecified deep veins of left distal lower extremity: Secondary | ICD-10-CM

## 2018-06-18 ENCOUNTER — Encounter: Payer: Self-pay | Admitting: Nurse Practitioner

## 2018-06-18 ENCOUNTER — Telehealth: Payer: Self-pay | Admitting: Registered Nurse

## 2018-06-18 DIAGNOSIS — M1712 Unilateral primary osteoarthritis, left knee: Secondary | ICD-10-CM

## 2018-06-18 DIAGNOSIS — M47816 Spondylosis without myelopathy or radiculopathy, lumbar region: Secondary | ICD-10-CM

## 2018-06-18 DIAGNOSIS — M1711 Unilateral primary osteoarthritis, right knee: Secondary | ICD-10-CM

## 2018-06-18 DIAGNOSIS — K5903 Drug induced constipation: Secondary | ICD-10-CM | POA: Insufficient documentation

## 2018-06-18 MED ORDER — MORPHINE SULFATE 15 MG PO TABS: 15.0000 mg | ORAL_TABLET | Freq: Two times a day (BID) | ORAL | 0 refills | Status: DC | PRN

## 2018-06-18 MED ORDER — MORPHINE SULFATE ER 15 MG PO TBCR: 15.0000 mg | EXTENDED_RELEASE_TABLET | Freq: Two times a day (BID) | ORAL | 0 refills | Status: DC

## 2018-06-18 NOTE — Telephone Encounter (Signed)
Received a call from Ms. Mcdaris, she reports she is taking her MS Contin every 12 hours and her MSIR twice a day for break through pain. Prescriptions e-scribe. She has a scheduled appointment with Dr. Naaman Plummer, she verbalizes understanding.

## 2018-06-18 NOTE — Telephone Encounter (Signed)
Responded to Ms. Lucia daughter message [name], no reply received. Placed a call to Mr. Wallie Char, he asked if I would call Ms. Brouhard on her cell, no answer. Will place another call to Mr. Wallie Char regarding the above. Spoke with Mr. Wallie Char he states he will call office back. This call is regarding Ms. Hegwood Morphine, he verbalizes understanding.

## 2018-06-19 DIAGNOSIS — D509 Iron deficiency anemia, unspecified: Secondary | ICD-10-CM | POA: Diagnosis not present

## 2018-06-19 DIAGNOSIS — F329 Major depressive disorder, single episode, unspecified: Secondary | ICD-10-CM | POA: Diagnosis not present

## 2018-06-19 DIAGNOSIS — K5903 Drug induced constipation: Secondary | ICD-10-CM | POA: Diagnosis not present

## 2018-06-19 DIAGNOSIS — M48061 Spinal stenosis, lumbar region without neurogenic claudication: Secondary | ICD-10-CM | POA: Diagnosis not present

## 2018-06-19 DIAGNOSIS — Z7901 Long term (current) use of anticoagulants: Secondary | ICD-10-CM | POA: Diagnosis not present

## 2018-06-19 DIAGNOSIS — J9611 Chronic respiratory failure with hypoxia: Secondary | ICD-10-CM | POA: Diagnosis not present

## 2018-06-19 DIAGNOSIS — J449 Chronic obstructive pulmonary disease, unspecified: Secondary | ICD-10-CM | POA: Diagnosis not present

## 2018-06-19 DIAGNOSIS — E039 Hypothyroidism, unspecified: Secondary | ICD-10-CM | POA: Diagnosis not present

## 2018-06-19 DIAGNOSIS — M109 Gout, unspecified: Secondary | ICD-10-CM | POA: Diagnosis not present

## 2018-06-19 DIAGNOSIS — I959 Hypotension, unspecified: Secondary | ICD-10-CM | POA: Diagnosis not present

## 2018-06-19 DIAGNOSIS — I5032 Chronic diastolic (congestive) heart failure: Secondary | ICD-10-CM | POA: Diagnosis not present

## 2018-06-19 DIAGNOSIS — I824Z2 Acute embolism and thrombosis of unspecified deep veins of left distal lower extremity: Secondary | ICD-10-CM | POA: Diagnosis not present

## 2018-06-19 DIAGNOSIS — E1140 Type 2 diabetes mellitus with diabetic neuropathy, unspecified: Secondary | ICD-10-CM | POA: Diagnosis not present

## 2018-06-19 DIAGNOSIS — M19071 Primary osteoarthritis, right ankle and foot: Secondary | ICD-10-CM | POA: Diagnosis not present

## 2018-06-19 DIAGNOSIS — E1122 Type 2 diabetes mellitus with diabetic chronic kidney disease: Secondary | ICD-10-CM | POA: Diagnosis not present

## 2018-06-19 DIAGNOSIS — M5416 Radiculopathy, lumbar region: Secondary | ICD-10-CM | POA: Diagnosis not present

## 2018-06-19 DIAGNOSIS — I130 Hypertensive heart and chronic kidney disease with heart failure and stage 1 through stage 4 chronic kidney disease, or unspecified chronic kidney disease: Secondary | ICD-10-CM | POA: Diagnosis not present

## 2018-06-19 DIAGNOSIS — Z8701 Personal history of pneumonia (recurrent): Secondary | ICD-10-CM | POA: Diagnosis not present

## 2018-06-19 DIAGNOSIS — M19072 Primary osteoarthritis, left ankle and foot: Secondary | ICD-10-CM | POA: Diagnosis not present

## 2018-06-19 DIAGNOSIS — N189 Chronic kidney disease, unspecified: Secondary | ICD-10-CM | POA: Diagnosis not present

## 2018-06-19 DIAGNOSIS — J8410 Pulmonary fibrosis, unspecified: Secondary | ICD-10-CM | POA: Diagnosis not present

## 2018-06-19 DIAGNOSIS — M069 Rheumatoid arthritis, unspecified: Secondary | ICD-10-CM | POA: Diagnosis not present

## 2018-06-19 DIAGNOSIS — Z7952 Long term (current) use of systemic steroids: Secondary | ICD-10-CM | POA: Diagnosis not present

## 2018-06-19 DIAGNOSIS — Z9981 Dependence on supplemental oxygen: Secondary | ICD-10-CM | POA: Diagnosis not present

## 2018-06-19 DIAGNOSIS — M170 Bilateral primary osteoarthritis of knee: Secondary | ICD-10-CM | POA: Diagnosis not present

## 2018-06-20 DIAGNOSIS — I824Z2 Acute embolism and thrombosis of unspecified deep veins of left distal lower extremity: Secondary | ICD-10-CM | POA: Diagnosis not present

## 2018-06-20 DIAGNOSIS — I5032 Chronic diastolic (congestive) heart failure: Secondary | ICD-10-CM | POA: Diagnosis not present

## 2018-06-20 DIAGNOSIS — E1140 Type 2 diabetes mellitus with diabetic neuropathy, unspecified: Secondary | ICD-10-CM | POA: Diagnosis not present

## 2018-06-20 DIAGNOSIS — M48061 Spinal stenosis, lumbar region without neurogenic claudication: Secondary | ICD-10-CM | POA: Diagnosis not present

## 2018-06-20 DIAGNOSIS — I130 Hypertensive heart and chronic kidney disease with heart failure and stage 1 through stage 4 chronic kidney disease, or unspecified chronic kidney disease: Secondary | ICD-10-CM | POA: Diagnosis not present

## 2018-06-20 DIAGNOSIS — J449 Chronic obstructive pulmonary disease, unspecified: Secondary | ICD-10-CM | POA: Diagnosis not present

## 2018-06-21 ENCOUNTER — Telehealth: Payer: Self-pay

## 2018-06-21 NOTE — Telephone Encounter (Signed)
Patient called requesting that the olmesartan and linzess be sent to [facility] and also wanted samples of linzess. I advised her the medications have been sent and she has samples at the front desk. YRL,RMA

## 2018-06-25 ENCOUNTER — Ambulatory Visit (INDEPENDENT_AMBULATORY_CARE_PROVIDER_SITE_OTHER): Payer: Medicare Other | Admitting: Cardiology

## 2018-06-25 ENCOUNTER — Encounter: Payer: Self-pay | Admitting: Cardiology

## 2018-06-25 VITALS — BP 153/88 | HR 98 | Ht 64.0 in | Wt 277.8 lb

## 2018-06-25 DIAGNOSIS — N183 Chronic kidney disease, stage 3 unspecified: Secondary | ICD-10-CM | POA: Insufficient documentation

## 2018-06-25 DIAGNOSIS — IMO0001 Reserved for inherently not codable concepts without codable children: Secondary | ICD-10-CM

## 2018-06-25 DIAGNOSIS — R55 Syncope and collapse: Secondary | ICD-10-CM

## 2018-06-25 DIAGNOSIS — G8929 Other chronic pain: Secondary | ICD-10-CM | POA: Insufficient documentation

## 2018-06-25 DIAGNOSIS — G894 Chronic pain syndrome: Secondary | ICD-10-CM | POA: Diagnosis not present

## 2018-06-25 DIAGNOSIS — Z7901 Long term (current) use of anticoagulants: Secondary | ICD-10-CM

## 2018-06-25 DIAGNOSIS — Z0389 Encounter for observation for other suspected diseases and conditions ruled out: Secondary | ICD-10-CM | POA: Diagnosis not present

## 2018-06-25 DIAGNOSIS — I824Y2 Acute embolism and thrombosis of unspecified deep veins of left proximal lower extremity: Secondary | ICD-10-CM

## 2018-06-25 DIAGNOSIS — J849 Interstitial pulmonary disease, unspecified: Secondary | ICD-10-CM

## 2018-06-25 NOTE — Assessment & Plan Note (Signed)
BMI 47 

## 2018-06-25 NOTE — Patient Instructions (Addendum)
Medication Instructions:  STOP Aspirin If you need a refill on your cardiac medications before your next appointment, please call your pharmacy.   Lab work: None  If you have labs (blood work) drawn today and your tests are completely normal, you will receive your results only by: [name] MyChart Message (if you have MyChart) OR . A paper copy in the mail If you have any lab test that is abnormal or we need to change your treatment, we will call you to review the results.  Testing/Procedures: None   Follow-Up: At [facility], you and your health needs are our priority.  As part of our continuing mission to provide you with exceptional heart care, we have created designated Provider Care Teams.  These Care Teams include your primary Cardiologist (physician) and Advanced Practice Providers (APPs -  Physician Assistants and Nurse Practitioners) who all work together to provide you with the care you need, when you need it. You will need a follow up appointment in 12 months.  Please call our office 2 months in advance to schedule this appointment.  You may see Quay Burow, MD or one of the following Advanced Practice Providers on your designated Care Team:   Kerin Ransom, PA-C Roby Lofts, [place] . Sande Rives, PA-C  Any Other Special Instructions Will Be Listed Below (If Applicable).

## 2018-06-25 NOTE — Assessment & Plan Note (Signed)
Eliquis added Jan 2020

## 2018-06-25 NOTE — Assessment & Plan Note (Signed)
Followed by [facility]

## 2018-06-25 NOTE — Progress Notes (Signed)
06/25/2018 [name]   September 08, 1940  [id number]  Primary Physician Glendale Chard, MD Primary Cardiologist: Dr Gwenlyn Found (2017)  HPI: Patient is a 78 year old morbidly obese female with multiple medical problems, though no significant cardiac issues.  She had minor coronary disease in 2016 and has normal LV function by echo.  Other medical issues include chronic interstitial lung disease, CRI, chronic pain syndrome, and rheumatoid arthritis.  She was recently admitted January 2020 after syncopal spell leaving the pain clinic.  Ultimately it was felt this was secondary to dehydration and overmedication in conjunction with poor p.o. intake.  The patient was seen by cardiology during that admission.  After we had signed off her lower extremity venous Dopplers came back positive for left lower extremity DVT.  It was presumed she may have had a pulmonary embolism as well.  She did not have a CT scan because of chronic renal insufficiency.  A VQ scan it was determined would not change her treatment.  She was placed on Eliquis.  Echocardiogram during that admission showed normal LV function.  She is in the office today for follow up.  She is done well since discharge.  Her renal function is followed at [facility].  She still goes to [facility] for pulmonary issues and also sees [name].   Current Outpatient Medications  Medication Sig Dispense Refill  . acetaminophen (TYLENOL) 500 MG tablet Take 1,000 mg by mouth every 6 (six) hours as needed for moderate pain.     [name] albuterol (PROVENTIL HFA;VENTOLIN HFA) 108 (90 BASE) MCG/ACT inhaler Inhale 2 puffs into the lungs every 6 (six) hours as needed for wheezing or shortness of breath.    . allopurinol (ZYLOPRIM) 300 MG tablet Take 300 mg by mouth daily.    [name] apixaban (ELIQUIS) 5 MG TABS tablet Take 2 tablets (10 mg total) by mouth 2 (two) times daily. 11 tablet 0  . apixaban (ELIQUIS) 5 MG TABS tablet Take 1 tablet (5 mg total) by mouth 2  (two) times daily. 60 tablet 0  . atorvastatin (LIPITOR) 20 MG tablet Take 10 mg by mouth every evening.     [name] azithromycin (ZITHROMAX) 500 MG tablet Take 1 tablet by mouth daily. TAKE ON MON, WED & FRI  0  . Calcium Carb-Cholecalciferol (CALCIUM + D3 PO) Take 2 tablets by mouth daily with lunch.     . cholecalciferol (VITAMIN D) 1000 UNITS tablet Take 2,000 Units by mouth daily with lunch.     . colchicine 0.6 MG tablet Take 0.6 mg by mouth daily.    . cyclobenzaprine (FLEXERIL) 5 MG tablet Take 1 tablet (5 mg total) by mouth at bedtime as needed for muscle spasms. 90 tablet 3  . diclofenac sodium (VOLTAREN) 1 % GEL Apply 1 application topically 3 (three) times daily. Both hands and knees 3 Tube 4  . docusate sodium (COLACE) 100 MG capsule Take 1 capsule (100 mg total) by mouth 2 (two) times daily. 10 capsule 0  . DULoxetine (CYMBALTA) 60 MG capsule Take 1 capsule (60 mg total) by mouth daily. 90 capsule 3  . fluticasone (FLONASE) 50 MCG/ACT nasal spray Place 2 sprays into both nostrils daily. 16 g 3  . furosemide (LASIX) 20 MG tablet Take 3 tablets (60 mg total) by mouth daily. 30 tablet 0  . gabapentin (NEURONTIN) 600 MG tablet Take two in the morning , Two mid- afternoon and two at bedtime 540 tablet 3  . guaiFENesin (MUCINEX) 600 MG 12  hr tablet Take 1,200 mg by mouth 2 (two) times daily.    . hydroxychloroquine (PLAQUENIL) 200 MG tablet Take 200 mg by mouth daily.    [name] ipratropium-albuterol (DUONEB) 0.5-2.5 (3) MG/3ML SOLN Take 3 mLs by nebulization every 6 (six) hours as needed (cough/wheezing).     [name] levothyroxine (SYNTHROID, LEVOTHROID) 50 MCG tablet Take 1 tablet (50 mcg total) by mouth daily. 90 tablet 1  . linaclotide (LINZESS) 72 MCG capsule Take 1 capsule (72 mcg total) by mouth daily before breakfast. 90 capsule 1  . metoprolol succinate (TOPROL XL) 100 MG 24 hr tablet Take 1/2 tablet by mouth daily with or immediately following a meal. 45 tablet 1  . morphine (MS CONTIN) 15 MG 12  hr tablet Take 1 tablet (15 mg total) by mouth every 12 (twelve) hours. 60 tablet 0  . morphine (MSIR) 15 MG tablet Take 1 tablet (15 mg total) by mouth 2 (two) times daily as needed for moderate pain (Breakthrough Pain). 60 tablet 0  . Multiple Vitamin (MULTIVITAMIN WITH MINERALS) TABS Take 1 tablet by mouth daily.     [name] olmesartan (BENICAR) 20 MG tablet Take 1 tablet (20 mg total) by mouth daily. 90 tablet 1  . OXYGEN-HELIUM IN Inhale 2 L into the lungs at bedtime. And on exertion    . pantoprazole (PROTONIX) 40 MG tablet take 1 tablet by mouth once daily MINUTES BEFORE 1ST MEAL OF THE DAY 90 tablet 1  . potassium chloride (K-DUR,KLOR-CON) 10 MEQ tablet Take 10 mEq by mouth daily.     . predniSONE (DELTASONE) 5 MG tablet Take 12.5 mg by mouth every morning.   0  . Semaglutide,0.25 or 0.5MG/DOS, (OZEMPIC, 0.25 OR 0.5 MG/DOSE,) 2 MG/1.5ML SOPN Inject 0.5 mg into the skin once a week. 3 pen 1  . sodium chloride (OCEAN) 0.65 % SOLN nasal spray Place 1 spray into both nostrils as needed for congestion.    [name] Spacer/Aero-Holding Chambers (AEROCHAMBER MV) inhaler Use as instructed 1 each 0  . Tofacitinib Citrate (XELJANZ) 5 MG TABS Take 1 tablet (5 mg total) by mouth 2 (two) times daily. 180 tablet 1   No current facility-administered medications for this visit.     Allergies  Allergen Reactions  . Codeine Swelling and Other (See Comments)    Facial swelling Chest pain and swelling in legs  Facial swelling  . Infliximab Anaphylaxis    "sent me into shock" "sent me into shock"  . Lisinopril Swelling    Face and neck swelling Face and neck swelling    Past Medical History:  Diagnosis Date  . Acute on chronic diastolic (congestive) heart failure ([place])   . Anemia    iron deficiency anemia - secondary to blood loss ( chronic)   . Arthritis    endstage changes bilateral knees/bilateral ankles.   . Asthma   . Carotid artery occlusion   . Chronic fatigue   . Chronic kidney disease   .  Closed left hip fracture ([place])   . Clotting disorder ([place])    pt denies this  . Contusion of left knee    due to fall 1/14.  [name] COPD (chronic obstructive pulmonary disease) (HCC)    pulmonary fibrosis  . Depression, reactive   . Diabetes mellitus    type II   . Diastolic dysfunction   . Difficulty in walking   . Family history of heart disease   . Generalized muscle weakness   . Gout   . High cholesterol   .  History of falling   . Hypertension   . Hypothyroidism   . Interstitial lung disease ([place])   . 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    . Morbid obesity ([place])   . Morbid obesity ([place])   . Neuromuscular disorder ([place])    diabetic neuropathy   . Normal coronary arteries    cardiac catheterization performed  10/31/14  . RA (rheumatoid arthritis) ([place])    has been off methotreaxte since 10/13.  [name] Rheumatoid arthritis ([place])   . Shortness of breath   . Spinal stenosis of lumbar region   . Thyroid disease   . Unspecified lack of coordination   . URI (upper respiratory infection)     Social History   Socioeconomic History  . Marital status: Married    Spouse name: Not on file  . Number of children: 6  . Years of education: college  . Highest education level: Not on file  Occupational History  . Occupation: retired Ship broker  . Financial resource strain: Not on file  . Food insecurity:    Worry: Not on file    Inability: Not on file  . Transportation needs:    Medical: Not on file    Non-medical: Not on file  Tobacco Use  . Smoking status: Never Smoker  . Smokeless tobacco: Never Used  Substance and Sexual Activity  . Alcohol use: No  . Drug use: No  . Sexual activity: Not Currently  Lifestyle  . Physical activity:    Days per week: Not on file    Minutes per session: Not on file  . Stress: Not on file  Relationships  . Social connections:    Talks on  phone: Not on file    Gets together: Not on file    Attends religious service: Not on file    Active member of club or organization: Not on file    Attends meetings of clubs or organizations: Not on file    Relationship status: Not on file  . Intimate partner violence:    Fear of current or ex partner: Not on file    Emotionally abused: Not on file    Physically abused: Not on file    Forced sexual activity: Not on file  Other Topics Concern  . Not on file  Social History Narrative   Patient consumes 2-3 cups coffee per day.     Family History  Problem Relation Age of Onset  . Diabetes Mother   . Heart attack Mother   . Hypertension Father   . Lung cancer Father   . Diabetes Sister   . Diabetes Brother   . Hypertension Brother   . Heart disease Brother   . Heart attack Brother   . Kidney cancer Brother   . Uterine cancer Daughter   . Breast cancer Sister   . Rheum arthritis Maternal Uncle   . Gout Brother   . Kidney failure Brother        x 5     Review of Systems: General: negative for chills, fever, night sweats or weight changes.  Cardiovascular: negative for chest pain, dyspnea on exertion, edema, orthopnea, palpitations, paroxysmal nocturnal dyspnea or shortness of breath Dermatological: negative for rash Respiratory: negative for cough or wheezing Urologic: negative for hematuria Abdominal: negative for nausea, vomiting, diarrhea, bright red blood per rectum, melena, or hematemesis Neurologic: negative for visual changes, syncope,  or dizziness All other systems reviewed and are otherwise negative except as noted above.    Blood pressure (!) 153/88, pulse 98, height _0  (1.626 m), weight 277 lb 12.8 oz (126 kg), SpO2 94 %.  General appearance: alert, cooperative, no distress and morbidly obese Lungs: clear to auscultation bilaterally Heart: regular rate and rhythm Extremities: trace edema Skin: warm and dry Neurologic: Grossly normal   ASSESSMENT AND  PLAN:   Syncope and collapse 06/03/2018- normal LVF on echo.  Presumed to be secondary to hypotension from medications.  DVT (deep venous thrombosis) (HCC) New LLE DVT Jan 2020- possible PE but CTA not done (CRI) and VQ would most likley not change Rx.  Normal coronary arteries Normal coronary arteries (20%) cath 2016  Anticoagulated Eliquis added Jan 2020   Chronic pain Followed at pain clinic  Morbid obesity ([place]) BMI 47  ILD (interstitial lung disease) ([place]) Prior evaluation at [facility]  CRI (chronic renal insufficiency), stage 3 (moderate) (HCC) Followed by [facility]   PLAN  Stop ASA.  I suspect she will need anticoagulation indefinitely. She had labs last week by her nephrologist, will defer to them.  From a cardiac standpoint she is stable, normal LVF, no significant CAD.  F/U in one year.   Kerin Ransom PA-C 06/25/2018 2:18 PM

## 2018-06-25 NOTE — Assessment & Plan Note (Signed)
Prior evaluation at [facility]

## 2018-06-25 NOTE — Assessment & Plan Note (Signed)
06/03/2018- normal LVF on echo.  Presumed to be secondary to hypotension from medications.

## 2018-06-25 NOTE — Assessment & Plan Note (Signed)
Normal coronary arteries (20%) cath 2016

## 2018-06-25 NOTE — Assessment & Plan Note (Signed)
New LLE DVT Jan 2020- possible PE but CTA not done (CRI) and VQ would most likley not change Rx.

## 2018-06-25 NOTE — Assessment & Plan Note (Signed)
Followed at pain clinic

## 2018-06-26 DIAGNOSIS — I824Z2 Acute embolism and thrombosis of unspecified deep veins of left distal lower extremity: Secondary | ICD-10-CM | POA: Diagnosis not present

## 2018-06-26 DIAGNOSIS — J449 Chronic obstructive pulmonary disease, unspecified: Secondary | ICD-10-CM | POA: Diagnosis not present

## 2018-06-26 DIAGNOSIS — I130 Hypertensive heart and chronic kidney disease with heart failure and stage 1 through stage 4 chronic kidney disease, or unspecified chronic kidney disease: Secondary | ICD-10-CM | POA: Diagnosis not present

## 2018-06-26 DIAGNOSIS — E1140 Type 2 diabetes mellitus with diabetic neuropathy, unspecified: Secondary | ICD-10-CM | POA: Diagnosis not present

## 2018-06-26 DIAGNOSIS — I5032 Chronic diastolic (congestive) heart failure: Secondary | ICD-10-CM | POA: Diagnosis not present

## 2018-06-26 DIAGNOSIS — M48061 Spinal stenosis, lumbar region without neurogenic claudication: Secondary | ICD-10-CM | POA: Diagnosis not present

## 2018-06-28 DIAGNOSIS — I130 Hypertensive heart and chronic kidney disease with heart failure and stage 1 through stage 4 chronic kidney disease, or unspecified chronic kidney disease: Secondary | ICD-10-CM | POA: Diagnosis not present

## 2018-06-28 DIAGNOSIS — I5032 Chronic diastolic (congestive) heart failure: Secondary | ICD-10-CM | POA: Diagnosis not present

## 2018-06-28 DIAGNOSIS — I824Z2 Acute embolism and thrombosis of unspecified deep veins of left distal lower extremity: Secondary | ICD-10-CM | POA: Diagnosis not present

## 2018-06-28 DIAGNOSIS — M48061 Spinal stenosis, lumbar region without neurogenic claudication: Secondary | ICD-10-CM | POA: Diagnosis not present

## 2018-06-28 DIAGNOSIS — J449 Chronic obstructive pulmonary disease, unspecified: Secondary | ICD-10-CM | POA: Diagnosis not present

## 2018-06-28 DIAGNOSIS — E1140 Type 2 diabetes mellitus with diabetic neuropathy, unspecified: Secondary | ICD-10-CM | POA: Diagnosis not present

## 2018-06-29 ENCOUNTER — Other Ambulatory Visit: Payer: Medicare Other | Admitting: Licensed Clinical Social Worker

## 2018-06-29 DIAGNOSIS — Z515 Encounter for palliative care: Secondary | ICD-10-CM

## 2018-06-30 NOTE — Progress Notes (Signed)
COMMUNITY PALLIATIVE CARE SW NOTE  PATIENT NAME: [name] DOB: September 13, 1940 MRN: [id number]  PRIMARY CARE PROVIDER: Glendale Chard, MD  RESPONSIBLE PARTY:  Acct ID - Guarantor Home Phone Work Phone Relationship Acct Type  [id number] [name], [name] Self P/F     [address], [name], [place] [address]     PLAN OF CARE and INTERVENTIONS:             1. GOALS OF CARE/ ADVANCE CARE PLANNING:  Patient's goal is to remain at home with her husband.  She is a DNR. 2. SOCIAL/EMOTIONAL/SPIRITUAL ASSESSMENT/ INTERVENTIONS:  SW met with patient in her home.  Discussed her hospital admission last month.  She stated her medications were modified and she felt much better physically and mentally.  Patient reports she will not seek a counselor at this time due to improved mood.  Explored self care modalities.  She is interested in vision boards which SW will introduce during the next visit.   3. PATIENT/CAREGIVER EDUCATION/ COPING:  Patient copes by expressing her feelings openly. 4. PERSONAL EMERGENCY PLAN:  Patient has a cell phone with her at all times.  She is very in tune to when she needs to rest. 5. COMMUNITY RESOURCES COORDINATION/ HEALTH CARE NAVIGATION:  None. 6. FINANCIAL/LEGAL CONCERNS/INTERVENTIONS:  None.     SOCIAL HX:  Social History   Tobacco Use  . Smoking status: Never Smoker  . Smokeless tobacco: Never Used  Substance Use Topics  . Alcohol use: No    CODE STATUS:  DNR  ADVANCED DIRECTIVES: N MOST FORM COMPLETE:  N HOSPICE EDUCATION PROVIDED: N PPS:  Patient reports a normal appetite.  She ambulates with a walker. Duration of visit and documentation:  75 minutes.      Creola Corn Lamarion Mcevers, LCSW

## 2018-07-01 DIAGNOSIS — J449 Chronic obstructive pulmonary disease, unspecified: Secondary | ICD-10-CM | POA: Diagnosis not present

## 2018-07-01 DIAGNOSIS — E1140 Type 2 diabetes mellitus with diabetic neuropathy, unspecified: Secondary | ICD-10-CM | POA: Diagnosis not present

## 2018-07-01 DIAGNOSIS — I130 Hypertensive heart and chronic kidney disease with heart failure and stage 1 through stage 4 chronic kidney disease, or unspecified chronic kidney disease: Secondary | ICD-10-CM | POA: Diagnosis not present

## 2018-07-01 DIAGNOSIS — M48061 Spinal stenosis, lumbar region without neurogenic claudication: Secondary | ICD-10-CM | POA: Diagnosis not present

## 2018-07-01 DIAGNOSIS — I824Z2 Acute embolism and thrombosis of unspecified deep veins of left distal lower extremity: Secondary | ICD-10-CM | POA: Diagnosis not present

## 2018-07-01 DIAGNOSIS — I5032 Chronic diastolic (congestive) heart failure: Secondary | ICD-10-CM | POA: Diagnosis not present

## 2018-07-06 ENCOUNTER — Encounter: Payer: Self-pay | Admitting: Physical Medicine & Rehabilitation

## 2018-07-06 ENCOUNTER — Encounter: Payer: Medicare Other | Attending: Physical Medicine & Rehabilitation | Admitting: Physical Medicine & Rehabilitation

## 2018-07-06 ENCOUNTER — Telehealth: Payer: Self-pay | Admitting: Physical Medicine & Rehabilitation

## 2018-07-06 VITALS — BP 146/93 | HR 92 | Ht 64.0 in | Wt 272.0 lb

## 2018-07-06 DIAGNOSIS — E1142 Type 2 diabetes mellitus with diabetic polyneuropathy: Secondary | ICD-10-CM | POA: Insufficient documentation

## 2018-07-06 DIAGNOSIS — M47816 Spondylosis without myelopathy or radiculopathy, lumbar region: Secondary | ICD-10-CM | POA: Diagnosis not present

## 2018-07-06 DIAGNOSIS — F329 Major depressive disorder, single episode, unspecified: Secondary | ICD-10-CM | POA: Insufficient documentation

## 2018-07-06 DIAGNOSIS — M069 Rheumatoid arthritis, unspecified: Secondary | ICD-10-CM | POA: Diagnosis not present

## 2018-07-06 DIAGNOSIS — M1711 Unilateral primary osteoarthritis, right knee: Secondary | ICD-10-CM | POA: Diagnosis not present

## 2018-07-06 DIAGNOSIS — Z9889 Other specified postprocedural states: Secondary | ICD-10-CM | POA: Diagnosis not present

## 2018-07-06 DIAGNOSIS — Z90710 Acquired absence of both cervix and uterus: Secondary | ICD-10-CM | POA: Diagnosis not present

## 2018-07-06 DIAGNOSIS — M7062 Trochanteric bursitis, left hip: Secondary | ICD-10-CM | POA: Diagnosis not present

## 2018-07-06 DIAGNOSIS — M1712 Unilateral primary osteoarthritis, left knee: Secondary | ICD-10-CM

## 2018-07-06 DIAGNOSIS — M48062 Spinal stenosis, lumbar region with neurogenic claudication: Secondary | ICD-10-CM | POA: Insufficient documentation

## 2018-07-06 DIAGNOSIS — E1122 Type 2 diabetes mellitus with diabetic chronic kidney disease: Secondary | ICD-10-CM | POA: Diagnosis not present

## 2018-07-06 MED ORDER — MORPHINE SULFATE ER 15 MG PO TBCR: 15.0000 mg | EXTENDED_RELEASE_TABLET | Freq: Two times a day (BID) | ORAL | 0 refills | Status: DC

## 2018-07-06 MED ORDER — MORPHINE SULFATE 15 MG PO TABS: 15.0000 mg | ORAL_TABLET | Freq: Two times a day (BID) | ORAL | 0 refills | Status: DC | PRN

## 2018-07-06 NOTE — Telephone Encounter (Signed)
April, called  Ms. Stepp , this provider did not place a call to Ms. Magloire.

## 2018-07-06 NOTE — Telephone Encounter (Signed)
Patient is returning a phone call from [place].  Please call her on cell phone [phone].

## 2018-07-06 NOTE — Progress Notes (Signed)
Subjective:    Patient ID: [name], female    DOB: May 05, 1941, 78 y.o.   MRN: [id number]  HPI    [name] is here in follow up of her chronic pain. She was in the hospital last month with a pulmonary embolism, after we had sent her to the ED with shortness of breath on 06/02/2018. Since her hospitalization, she has been feeling a lot better. She continues to struggle with pain in her low back and bilateral knees. We have injected her knees in the past with some benefit. She uses MSIR and MSCR for pain control which allows her quality of life and improves her functional mobility.   She has also come to the realization that she needs to ask for help from her family, and needs to go about how she moves and approaches activities a little different than she did in the past. Her daughter accompanied her to this appointment today, and the pt presented this as an example of how she was allowing others to help her.     Pain Inventory Average Pain 7 Pain Right Now 7 My pain is sharp and burning  In the last 24 hours, has pain interfered with the following? General activity 7 Relation with others 7 Enjoyment of life 8 What TIME of day is your pain at its worst? morning Sleep (in general) Fair  Pain is worse with: walking, bending and sitting Pain improves with: rest, heat/ice and medication Relief from Meds: 6  Mobility use a walker use a wheelchair  Function retired  Neuro/Psych bladder control problems numbness trouble walking spasms  Prior Studies Any changes since last visit?  no  Physicians involved in your care Any changes since last visit?  no   Family History  Problem Relation Age of Onset  . Diabetes Mother   . Heart attack Mother   . Hypertension Father   . Lung cancer Father   . Diabetes Sister   . Diabetes Brother   . Hypertension Brother   . Heart disease Brother   . Heart attack Brother   . Kidney cancer Brother   . Uterine cancer Daughter     . Breast cancer Sister   . Rheum arthritis Maternal Uncle   . Gout Brother   . Kidney failure Brother        x 5   Social History   Socioeconomic History  . Marital status: Married    Spouse name: Not on file  . Number of children: 6  . Years of education: college  . Highest education level: Not on file  Occupational History  . Occupation: retired Ship broker  . Financial resource strain: Not on file  . Food insecurity:    Worry: Not on file    Inability: Not on file  . Transportation needs:    Medical: Not on file    Non-medical: Not on file  Tobacco Use  . Smoking status: Never Smoker  . Smokeless tobacco: Never Used  Substance and Sexual Activity  . Alcohol use: No  . Drug use: No  . Sexual activity: Not Currently  Lifestyle  . Physical activity:    Days per week: Not on file    Minutes per session: Not on file  . Stress: Not on file  Relationships  . Social connections:    Talks on phone: Not on file    Gets together: Not on file    Attends religious service: Not on file  Active member of club or organization: Not on file    Attends meetings of clubs or organizations: Not on file    Relationship status: Not on file  Other Topics Concern  . Not on file  Social History Narrative   Patient consumes 2-3 cups coffee per day.   Past Surgical History:  Procedure Laterality Date  . ABDOMINAL HYSTERECTOMY    . BRAIN SURGERY     Gamma knife 10/13. Needs repeat spring  '14  . CARDIAC CATHETERIZATION N/A 10/31/2014   Procedure: Right/Left Heart Cath and Coronary Angiography;  Surgeon: [name], MD;  Location: [facility];  Service: Cardiovascular;  Laterality: N/A;  . ESOPHAGOGASTRODUODENOSCOPY (EGD) WITH PROPOFOL N/A 09/14/2014   Procedure: ESOPHAGOGASTRODUODENOSCOPY (EGD) WITH PROPOFOL;  Surgeon: [name], MD;  Location: [facility];  Service: Endoscopy;  Laterality: N/A;  . INCISION AND DRAINAGE HIP Left 01/16/2017   Procedure:  IRRIGATION AND DEBRIDEMENT LEFT HIP;  Surgeon: [name], MD;  Location: [facility];  Service: Orthopedics;  Laterality: Left;  . INTRAMEDULLARY (IM) NAIL INTERTROCHANTERIC Left 11/29/2016   Procedure: INTRAMEDULLARY (IM) NAIL INTERTROCHANTRIC;  Surgeon: [name], MD;  Location: [facility];  Service: Orthopedics;  Laterality: Left;  . OVARY SURGERY    . SHOULDER SURGERY Left   . TONSILLECTOMY  age 28  . VIDEO BRONCHOSCOPY Bilateral 05/31/2013   Procedure: VIDEO BRONCHOSCOPY WITHOUT FLUORO;  Surgeon: Brand Males, MD;  Location: [facility];  Service: Cardiopulmonary;  Laterality: Bilateral;  . video bronscoscopy  2000   lung   Past Medical History:  Diagnosis Date  . Acute on chronic diastolic (congestive) heart failure ([place])   . Anemia    iron deficiency anemia - secondary to blood loss ( chronic)   . Arthritis    endstage changes bilateral knees/bilateral ankles.   . Asthma   . Carotid artery occlusion   . Chronic fatigue   . Chronic kidney disease   . Closed left hip fracture ([place])   . Clotting disorder ([place])    pt denies this  . Contusion of left knee    due to fall 1/14.  [name] COPD (chronic obstructive pulmonary disease) ([facility])    pulmonary fibrosis  . Depression, reactive   . Diabetes mellitus    type II   . Diastolic dysfunction   . Difficulty in walking   . Family history of heart disease   . Generalized muscle weakness   . Gout   . High cholesterol   . History of falling   . Hypertension   . Hypothyroidism   . Interstitial lung disease ([place])   . 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    . Morbid obesity ([place])   . Morbid obesity ([place])   . Neuromuscular disorder ([place])    diabetic neuropathy   . Normal coronary arteries    cardiac catheterization performed  10/31/14  . RA (rheumatoid arthritis) ([place] [facility])    has been off methotreaxte since 10/13.  [name]  Rheumatoid arthritis ([place])   . Shortness of breath   . Spinal stenosis of lumbar region   . Thyroid disease   . Unspecified lack of coordination   . URI (upper respiratory infection)    There were no vitals taken for this visit.  Opioid Risk Score:   Fall Risk Score:  `1  Depression screen [facility] 2/9  Depression screen [facility] 2/9 06/11/2018 05/26/2018 12/09/2017 10/22/2017 09/22/2017  07/28/2017 06/30/2017  Decreased Interest _0 0 0 0 0  Down, Depressed, Hopeless _1 0 0 0 0  PHQ - 2 Score _2 0 0 0 0  Altered sleeping 2 2 - - - - -  Tired, decreased energy 2 3 - - - - -  Change in appetite 2 1 - - - - -  Feeling bad or failure about yourself  2 2 - - - - -  Trouble concentrating 2 0 - - - - -  Moving slowly or fidgety/restless 0 2 - - - - -  Suicidal thoughts 0 0 - - - - -  PHQ-9 Score 12 13 - - - - -  Difficult doing work/chores - Somewhat difficult - - - - -  Some recent data might be hidden  \  Review of Systems  Constitutional: Negative.   HENT: Negative.   Eyes: Negative.   Respiratory: Positive for shortness of breath.   Cardiovascular: Negative.   Gastrointestinal: Positive for constipation.  Endocrine: Negative.   Genitourinary: Positive for difficulty urinating.  Musculoskeletal: Positive for arthralgias, joint swelling and myalgias.  Skin: Negative.   Allergic/Immunologic: Negative.   Neurological: Positive for numbness.  Hematological: Negative.   Psychiatric/Behavioral: Negative.   All other systems reviewed and are negative.      Objective:   Physical Exam General: No acute distress HEENT: EOMI, oral membranes moist Cards: reg rate  Chest: normal effort Abdomen: Soft, NT, ND Skin: dry, intact Extremities: bilateral lower ext 1+ edema Neuro:Pt is cognitively appropriate with normal insight, memory, and awareness. Cranial nerves 2-12 are intact. Sensory exam is near normal inbothlegs... Reflexes are 2+ in all 4's. Fine motor coordination is intact. No  tremors. Motor function is grossly 4 to 5/5 with pain inhbition.  Musculoskeletal: . Both knees with mild effusion, right more than left. Both knees with crepitus and med-lat joint line tenderness. Valgus deformity right knee is persistent. Right ankle without pain. Does pronate right foot. Was in w/c today. Hamstrings remain tight. lumbar spine TTP, limited ROM .  Psych:Pt's affect is appropriate. Pt is cooperative   Assessment & Plan:  Assessment:  1. Lumbar spinal stenosis with neurogenic claudication. Associated facet arthropathy  2. Depression.  3. Diabetes mellitus type 2 with polyneuropathy  4. Rheumatoid arthritis and osteoarthritis. Multiple joint involvement including bilateral knees.  5. Left greater troch bursitis  6. Interstitial lung disease  7.Hx of left hip fracture, post-op infection in July 2017.Patient with ongoing left hip pain along the lateral aspect/greater trochanter.    Plan:  1.maintain her [place] therapy as directed. I did provide her basic stretches for her glutes and TFL. I reviewed these with the patient and her daughter.  2. Voltaren gel for fingers/knees.  -will set her up for bilateral zilretta injections. Discussed medication and procedure with patient today.  3. MSO4 for breakthrough pain, one q8 prn #90--  -MS Contin 8m CR q12 #60.  -We will continue the controlled substance monitoring program, this consists of regular clinic visits, examinations, routine drug screening, pill [place].  .   4 .Pulmonary mgt per pulmonologist. . 5. Continue cymbalta 645mdaily.  6. Follow up withme in about a month for injections. 1572mtes of face to face patient/family care time were spent during this visit. All questions were encouraged and answered.

## 2018-07-06 NOTE — Patient Instructions (Signed)
PLEASE FEEL FREE TO CALL OUR OFFICE WITH ANY PROBLEMS OR QUESTIONS ([phone])      

## 2018-07-07 DIAGNOSIS — M48061 Spinal stenosis, lumbar region without neurogenic claudication: Secondary | ICD-10-CM | POA: Diagnosis not present

## 2018-07-07 DIAGNOSIS — I824Z2 Acute embolism and thrombosis of unspecified deep veins of left distal lower extremity: Secondary | ICD-10-CM | POA: Diagnosis not present

## 2018-07-07 DIAGNOSIS — J449 Chronic obstructive pulmonary disease, unspecified: Secondary | ICD-10-CM | POA: Diagnosis not present

## 2018-07-07 DIAGNOSIS — I130 Hypertensive heart and chronic kidney disease with heart failure and stage 1 through stage 4 chronic kidney disease, or unspecified chronic kidney disease: Secondary | ICD-10-CM | POA: Diagnosis not present

## 2018-07-07 DIAGNOSIS — I5032 Chronic diastolic (congestive) heart failure: Secondary | ICD-10-CM | POA: Diagnosis not present

## 2018-07-07 DIAGNOSIS — E1140 Type 2 diabetes mellitus with diabetic neuropathy, unspecified: Secondary | ICD-10-CM | POA: Diagnosis not present

## 2018-07-08 DIAGNOSIS — I130 Hypertensive heart and chronic kidney disease with heart failure and stage 1 through stage 4 chronic kidney disease, or unspecified chronic kidney disease: Secondary | ICD-10-CM | POA: Diagnosis not present

## 2018-07-08 DIAGNOSIS — I824Z2 Acute embolism and thrombosis of unspecified deep veins of left distal lower extremity: Secondary | ICD-10-CM | POA: Diagnosis not present

## 2018-07-08 DIAGNOSIS — I5032 Chronic diastolic (congestive) heart failure: Secondary | ICD-10-CM | POA: Diagnosis not present

## 2018-07-08 DIAGNOSIS — E1140 Type 2 diabetes mellitus with diabetic neuropathy, unspecified: Secondary | ICD-10-CM | POA: Diagnosis not present

## 2018-07-08 DIAGNOSIS — M48061 Spinal stenosis, lumbar region without neurogenic claudication: Secondary | ICD-10-CM | POA: Diagnosis not present

## 2018-07-08 DIAGNOSIS — J449 Chronic obstructive pulmonary disease, unspecified: Secondary | ICD-10-CM | POA: Diagnosis not present

## 2018-07-09 DIAGNOSIS — M48061 Spinal stenosis, lumbar region without neurogenic claudication: Secondary | ICD-10-CM | POA: Diagnosis not present

## 2018-07-09 DIAGNOSIS — I130 Hypertensive heart and chronic kidney disease with heart failure and stage 1 through stage 4 chronic kidney disease, or unspecified chronic kidney disease: Secondary | ICD-10-CM | POA: Diagnosis not present

## 2018-07-09 DIAGNOSIS — J449 Chronic obstructive pulmonary disease, unspecified: Secondary | ICD-10-CM | POA: Diagnosis not present

## 2018-07-09 DIAGNOSIS — I5032 Chronic diastolic (congestive) heart failure: Secondary | ICD-10-CM | POA: Diagnosis not present

## 2018-07-09 DIAGNOSIS — I824Z2 Acute embolism and thrombosis of unspecified deep veins of left distal lower extremity: Secondary | ICD-10-CM | POA: Diagnosis not present

## 2018-07-09 DIAGNOSIS — E1140 Type 2 diabetes mellitus with diabetic neuropathy, unspecified: Secondary | ICD-10-CM | POA: Diagnosis not present

## 2018-07-12 DIAGNOSIS — E1140 Type 2 diabetes mellitus with diabetic neuropathy, unspecified: Secondary | ICD-10-CM | POA: Diagnosis not present

## 2018-07-12 DIAGNOSIS — M48061 Spinal stenosis, lumbar region without neurogenic claudication: Secondary | ICD-10-CM | POA: Diagnosis not present

## 2018-07-12 DIAGNOSIS — J449 Chronic obstructive pulmonary disease, unspecified: Secondary | ICD-10-CM | POA: Diagnosis not present

## 2018-07-12 DIAGNOSIS — I130 Hypertensive heart and chronic kidney disease with heart failure and stage 1 through stage 4 chronic kidney disease, or unspecified chronic kidney disease: Secondary | ICD-10-CM | POA: Diagnosis not present

## 2018-07-12 DIAGNOSIS — I5032 Chronic diastolic (congestive) heart failure: Secondary | ICD-10-CM | POA: Diagnosis not present

## 2018-07-12 DIAGNOSIS — I824Z2 Acute embolism and thrombosis of unspecified deep veins of left distal lower extremity: Secondary | ICD-10-CM | POA: Diagnosis not present

## 2018-07-13 DIAGNOSIS — E1140 Type 2 diabetes mellitus with diabetic neuropathy, unspecified: Secondary | ICD-10-CM | POA: Diagnosis not present

## 2018-07-13 DIAGNOSIS — I130 Hypertensive heart and chronic kidney disease with heart failure and stage 1 through stage 4 chronic kidney disease, or unspecified chronic kidney disease: Secondary | ICD-10-CM | POA: Diagnosis not present

## 2018-07-13 DIAGNOSIS — I5032 Chronic diastolic (congestive) heart failure: Secondary | ICD-10-CM | POA: Diagnosis not present

## 2018-07-13 DIAGNOSIS — M48061 Spinal stenosis, lumbar region without neurogenic claudication: Secondary | ICD-10-CM | POA: Diagnosis not present

## 2018-07-13 DIAGNOSIS — I824Z2 Acute embolism and thrombosis of unspecified deep veins of left distal lower extremity: Secondary | ICD-10-CM | POA: Diagnosis not present

## 2018-07-13 DIAGNOSIS — J449 Chronic obstructive pulmonary disease, unspecified: Secondary | ICD-10-CM | POA: Diagnosis not present

## 2018-07-14 ENCOUNTER — Other Ambulatory Visit: Payer: Self-pay

## 2018-07-14 ENCOUNTER — Encounter: Payer: Self-pay | Admitting: Family Medicine

## 2018-07-14 ENCOUNTER — Ambulatory Visit (INDEPENDENT_AMBULATORY_CARE_PROVIDER_SITE_OTHER): Payer: Medicare Other | Admitting: Family Medicine

## 2018-07-14 VITALS — BP 138/82 | HR 87 | Temp 98.3°F | Wt 276.6 lb

## 2018-07-14 DIAGNOSIS — N183 Chronic kidney disease, stage 3 unspecified: Secondary | ICD-10-CM

## 2018-07-14 DIAGNOSIS — M5416 Radiculopathy, lumbar region: Secondary | ICD-10-CM

## 2018-07-14 DIAGNOSIS — M48061 Spinal stenosis, lumbar region without neurogenic claudication: Secondary | ICD-10-CM | POA: Diagnosis not present

## 2018-07-14 DIAGNOSIS — I10 Essential (primary) hypertension: Secondary | ICD-10-CM | POA: Diagnosis not present

## 2018-07-14 DIAGNOSIS — Z7689 Persons encountering health services in other specified circumstances: Secondary | ICD-10-CM | POA: Diagnosis not present

## 2018-07-14 DIAGNOSIS — M05742 Rheumatoid arthritis with rheumatoid factor of left hand without organ or systems involvement: Secondary | ICD-10-CM | POA: Diagnosis not present

## 2018-07-14 DIAGNOSIS — M05741 Rheumatoid arthritis with rheumatoid factor of right hand without organ or systems involvement: Secondary | ICD-10-CM

## 2018-07-14 DIAGNOSIS — E039 Hypothyroidism, unspecified: Secondary | ICD-10-CM | POA: Diagnosis not present

## 2018-07-14 DIAGNOSIS — J849 Interstitial pulmonary disease, unspecified: Secondary | ICD-10-CM

## 2018-07-14 DIAGNOSIS — K219 Gastro-esophageal reflux disease without esophagitis: Secondary | ICD-10-CM | POA: Diagnosis not present

## 2018-07-14 DIAGNOSIS — D329 Benign neoplasm of meninges, unspecified: Secondary | ICD-10-CM

## 2018-07-14 DIAGNOSIS — E1140 Type 2 diabetes mellitus with diabetic neuropathy, unspecified: Secondary | ICD-10-CM

## 2018-07-14 NOTE — Patient Instructions (Signed)
Preventing Diabetes Mellitus Complications You can take action to prevent or slow down problems that are caused by diabetes (diabetes mellitus). Following your diabetes plan and taking care of yourself can reduce your risk of serious or life-threatening complications. What actions can I take to prevent diabetes complications? Manage your diabetes   Follow instructions from your health care providers about managing your diabetes. Your diabetes may be managed by a team of health care providers who can teach you how to care for yourself and can answer questions that you have.  Educate yourself about your condition so you can make healthy choices about eating and physical activity.  Check your blood sugar (glucose) levels as often as directed. Your health care provider will help you decide how often to check your blood glucose level depending on your treatment goals and how well you are meeting them.  Ask your health care provider if you should take low-dose aspirin daily and what dose is recommended for you. Taking low-dose aspirin daily is recommended to help prevent cardiovascular disease. Do not use nicotine or tobacco Do not use any products that contain nicotine or tobacco, such as cigarettes and e-cigarettes. If you need help quitting, ask your health care provider. Nicotine raises your risk for diabetes problems. If you quit using nicotine:  You will lower your risk for heart attack, stroke, nerve disease, and kidney disease.  Your cholesterol and blood pressure may improve.  Your blood circulation will improve. Keep your blood pressure under control Your personal target blood pressure is determined based on:  Your age.  Your medicines.  How long you have had diabetes.  Any other medical conditions you have. To control your blood pressure:  Follow instructions from your health care provider about meal planning, exercise, and medicines.  Make sure your health care provider  checks your blood pressure at every medical visit.  Monitor your blood pressure at home as told by your health care provider.  Keep your cholesterol under control To control your cholesterol:  Follow instructions from your health care provider about meal planning, exercise, and medicines.  Have your cholesterol checked at least once a year.  You may be prescribed medicine to lower cholesterol (statin). If you are not taking a statin, ask your health care provider if you should be. Controlling your cholesterol may:  Help prevent heart disease and stroke. These are the most common health problems for people with diabetes.  Improve your blood flow. Schedule and keep yearly physical exams and eye exams Your health care provider will tell you how often you need medical visits depending on your diabetes management plan. Keep all follow-up visits as directed. This is important so possible problems can be identified early and complications can be avoided or treated.  Every visit with your health care provider should include measuring your: ? Weight. ? Blood pressure. ? Blood glucose control.  Your A1c (hemoglobin A1c) level should be checked: ? At least 2 times a year, if you are meeting your treatment goals. ? 4 times a year, if you are not meeting treatment goals or if your treatment goals have changed.  Your blood lipids (lipid profile) should be checked yearly. You should also be checked yearly for protein in your urine (urine microalbumin).  If you have type 1 diabetes, get an eye exam 3-5 years after you are diagnosed, and then once a year after your first exam.  If you have type 2 diabetes, get an eye exam as soon as you  are diagnosed, and then once a year after your first exam. Keep your vaccines current It is recommended that you receive:  A flu (influenza) vaccine every year.  A pneumonia (pneumococcal) vaccine and a hepatitis B vaccine. If you are age 46 or older, you may  get the pneumonia vaccine as a series of two separate shots. Ask your health care provider which other vaccines may be recommended. Take care of your feet Diabetes may cause you to have poor blood circulation to your legs and feet. Because of this, taking care of your feet is very important. Diabetes can cause:  The skin on the feet to get thinner, break more easily, and heal more slowly.  Nerve damage in your legs and feet, which results in decreased feeling. You may not notice minor injuries that could lead to serious problems. To avoid foot problems:  Check your skin and feet every day for cuts, bruises, redness, blisters, or sores.  Schedule a foot exam with your health care provider once every year. This exam includes: ? Inspecting of the structure and skin of your feet. ? Checking the pulses and sensation in your feet.  Make sure that your health care provider performs a visual foot exam at every medical visit.  Take care of your teeth People with poorly controlled diabetes are more likely to have gum (periodontal) disease. Diabetes can make periodontal diseases harder to control. If not treated, periodontal diseases can lead to tooth loss. To prevent this:  Brush your teeth twice a day.  Floss at least once a day.  Visit your dentist 2 times a year. Drink responsibly Limit alcohol intake to no more than 1 drink a day for nonpregnant women and 2 drinks a day for men. One drink equals 12 oz of beer, 5 oz of wine, or 1 oz of hard liquor.  It is important to eat food when you drink alcohol to avoid low blood glucose (hypoglycemia). Avoid alcohol if you:  Have a history of alcohol abuse or dependence.  Are pregnant.  Have liver disease, pancreatitis, advanced neuropathy, or severe hypertriglyceridemia. Lessen stress Living with diabetes can be stressful. When you are experiencing stress, your blood glucose may be affected in two ways:  Stress hormones may cause your blood  glucose to rise.  You may be distracted from taking good care of yourself. Be aware of your stress level and make changes to help you manage challenging situations. To lower your stress levels:  Consider joining a support group.  Do planned relaxation or meditation.  Do a hobby that you enjoy.  Maintain healthy relationships.  Exercise regularly.  Work with your health care provider or a mental health professional. Summary  You can take action to prevent or slow down problems that are caused by diabetes (diabetes mellitus). Following your diabetes plan and taking care of yourself can reduce your risk of serious or life-threatening complications.  Follow instructions from your health care providers about managing your diabetes. Your diabetes may be managed by a team of health care providers who can teach you how to care for yourself and can answer questions that you have.  Your health care provider will tell you how often you need medical visits depending on your diabetes management plan. Keep all follow-up visits as directed. This is important so possible problems can be identified early and complications can be avoided or treated. This information is not intended to replace advice given to you by your health care provider. Make sure  you discuss any questions you have with your health care provider. Document Released: 01/21/2011 Document Revised: 12/23/2016 Document Reviewed: 02/02/2016 Elsevier Interactive Patient Education  2019 [employer].  Hypothyroidism  Hypothyroidism is when the thyroid gland does not make enough of certain hormones (it is underactive). The thyroid gland is a small gland located in the lower front part of the neck, just in front of the windpipe (trachea). This gland makes hormones that help control how the body uses food for energy (metabolism) as well as how the heart and brain function. These hormones also play a role in keeping your bones strong. When the  thyroid is underactive, it produces too little of the hormones thyroxine (T4) and triiodothyronine (T3). What are the causes? This condition may be caused by:  Hashimoto's disease. This is a disease in which the body's disease-fighting system (immune system) attacks the thyroid gland. This is the most common cause.  Viral infections.  Pregnancy.  Certain medicines.  Birth defects.  Past radiation treatments to the head or neck for cancer.  Past treatment with radioactive iodine.  Past exposure to radiation in the environment.  Past surgical removal of part or all of the thyroid.  Problems with a gland in the center of the brain (pituitary gland).  Lack of enough iodine in the diet. What increases the risk? You are more likely to develop this condition if:  You are female.  You have a family history of thyroid conditions.  You use a medicine called lithium.  You take medicines that affect the immune system (immunosuppressants). What are the signs or symptoms? Symptoms of this condition include:  Feeling as though you have no energy (lethargy).  Not being able to tolerate cold.  Weight gain that is not explained by a change in diet or exercise habits.  Lack of appetite.  Dry skin.  Coarse hair.  Menstrual irregularity.  Slowing of thought processes.  Constipation.  Sadness or depression. How is this diagnosed? This condition may be diagnosed based on:  Your symptoms, your medical history, and a physical exam.  Blood tests. You may also have imaging tests, such as an ultrasound or MRI. How is this treated? This condition is treated with medicine that replaces the thyroid hormones that your body does not make. After you begin treatment, it may take several weeks for symptoms to go away. Follow these instructions at home:  Take over-the-counter and prescription medicines only as told by your health care provider.  If you start taking any new medicines,  tell your health care provider.  Keep all follow-up visits as told by your health care provider. This is important. ? As your condition improves, your dosage of thyroid hormone medicine may change. ? You will need to have blood tests regularly so that your health care provider can monitor your condition. Contact a health care provider if:  Your symptoms do not get better with treatment.  You are taking thyroid replacement medicine and you: ? Sweat a lot. ? Have tremors. ? Feel anxious. ? Lose weight rapidly. ? Cannot tolerate heat. ? Have emotional swings. ? Have diarrhea. ? Feel weak. Get help right away if you have:  Chest pain.  An irregular heartbeat.  A rapid heartbeat.  Difficulty breathing. Summary  Hypothyroidism is when the thyroid gland does not make enough of certain hormones (it is underactive).  When the thyroid is underactive, it produces too little of the hormones thyroxine (T4) and triiodothyronine (T3).  The most common cause  is Hashimoto's disease, a disease in which the body's disease-fighting system (immune system) attacks the thyroid gland. The condition can also be caused by viral infections, medicine, pregnancy, or past radiation treatment to the head or neck.  Symptoms may include weight gain, dry skin, constipation, feeling as though you do not have energy, and not being able to tolerate cold.  This condition is treated with medicine to replace the thyroid hormones that your body does not make. This information is not intended to replace advice given to you by your health care provider. Make sure you discuss any questions you have with your health care provider. Document Released: 05/05/2005 Document Revised: 04/15/2017 Document Reviewed: 04/15/2017 Elsevier Interactive Patient Education  2019 [employer]  Chronic Kidney Disease, Adult Chronic kidney disease (CKD) occurs when the kidneys become damaged slowly over a long period of time. The  kidneys are a pair of organs that do many important jobs in the body, including:  Removing waste and extra fluid from the blood to make urine.  Making hormones that maintain the amount of fluid in tissues and blood vessels.  Maintaining the right amount of fluids and chemicals in the body. A small amount of kidney damage may not cause problems, but a large amount of damage may make it hard or impossible for the kidneys to work the way they should. If steps are not taken to slow down kidney damage or to stop it from getting worse, the kidneys may stop working permanently (end-stage renal disease or ESRD). Most of the time, CKD does not go away, but it can often be controlled. People who have CKD are usually able to live normal lives. What are the causes? The most common causes of this condition are diabetes and high blood pressure (hypertension). Other causes include:  Heart and blood vessel (cardiovascular) disease.  Kidney diseases, such as: ? Glomerulonephritis. ? Interstitial nephritis. ? Polycystic kidney disease. ? Renal vascular disease.  Diseases that affect the immune system.  Genetic diseases.  Medicines that damage the kidneys, such as anti-inflammatory medicines.  Being around or being in contact with poisonous (toxic) substances.  A kidney or urinary infection that occurs again and again (recurs).  Vasculitis. This is swelling or inflammation of the blood vessels.  A problem with urine flow that may be caused by: ? Cancer. ? Having kidney stones more than one time. ? An enlarged prostate, in males. What increases the risk? You are more likely to develop this condition if you:  Are older than age 31.  Are female.  Are African-American, Hispanic, Asian, [place], or American [place].  Are a current or former smoker.  Are obese.  Have a family history of kidney disease or failure.  Often take medicines that are damaging to the kidneys. What are the  signs or symptoms? Symptoms of this condition include:  Swelling (edema) of the face, legs, ankles, or feet.  Tiredness (lethargy) and having less energy.  Nausea or vomiting.  Confusion or trouble concentrating.  Problems with urination, such as: ? Painful or burning feeling during urination. ? Decreased urine production. ? Frequent urination, especially at night. ? Bloody urine.  Muscle twitches and cramps, especially in the legs.  Shortness of breath.  Weakness.  Loss of appetite.  Metallic taste in the mouth.  Trouble sleeping.  Dry, itchy skin.  A low blood count (anemia).  Pale lining of the eyelids and surface of the eye (conjunctiva). Symptoms develop slowly and may not be obvious until  the kidney damage becomes severe. It is possible to have kidney disease for years without having any symptoms. How is this diagnosed? This condition may be diagnosed based on:  Blood tests.  Urine tests.  Imaging tests, such as an ultrasound or CT scan.  A test in which a sample of tissue is removed from the kidneys to be examined under a microscope (kidney biopsy). These test results will help your health care provider determine how serious the CKD is. How is this treated? There is no cure for most cases of this condition, but treatment usually relieves symptoms and prevents or slows the progression of the disease. Treatment may include:  Making diet changes, which may require you to avoid alcohol, salty foods (sodium), and foods that are high in potassium, calcium, and protein.  Medicines: ? To lower blood pressure. ? To control blood glucose. ? To relieve anemia. ? To relieve swelling. ? To protect your bones. ? To improve the balance of electrolytes in your blood.  Removing toxic waste from the body through types of dialysis, if the kidneys can no longer do their job (kidney failure).  Managing any other conditions that are causing your CKD or making it  worse. Follow these instructions at home: Medicines  Take over-the-counter and prescription medicines only as told by your health care provider. The dose of some medicines that you take may need to be adjusted.  Do not take any new medicines unless approved by your health care provider. Many medicines can worsen your kidney damage.  Do not take any vitamin and mineral supplements unless approved by your health care provider. Many nutritional supplements can worsen your kidney damage. General instructions  Follow your prescribed diet as told by your health care provider.  Do not use any products that contain nicotine or tobacco, such as cigarettes and e-cigarettes. If you need help quitting, ask your health care provider.  Monitor and track your blood pressure at home. Report changes in your blood pressure as told by your health care provider.  If you are being treated for diabetes, monitor and track your blood sugar (blood glucose) levels as told by your health care provider.  Maintain a healthy weight. If you need help with this, ask your health care provider.  Start or continue an exercise plan. Exercise at least 30 minutes a day, 5 days a week.  Keep your immunizations up to date as told by your health care provider.  Keep all follow-up visits as told by your health care provider. This is important. Where to find more information  American Association of Kidney Patients: BombTimer.gl  National Kidney Foundation: www.kidney.[place]: https://mathis.com/  [facility]: www.lifeoptions.org and www.kidneyschool.org Contact a health care provider if:  Your symptoms get worse.  You develop new symptoms. Get help right away if:  You develop symptoms of ESRD, which include: ? Headaches. ? Numbness in the hands or feet. ? Easy bruising. ? Frequent hiccups. ? Chest pain. ? Shortness of breath. ? Lack of menstruation, in women.  You have a  fever.  You have decreased urine production.  You have pain or bleeding when you urinate. Summary  Chronic kidney disease (CKD) occurs when the kidneys become damaged slowly over a long period of time.  The most common causes of this condition are diabetes and high blood pressure (hypertension).  There is no cure for most cases of this condition, but treatment usually relieves symptoms and prevents or slows the progression  of the disease. Treatment may include a combination of medicines and lifestyle changes. This information is not intended to replace advice given to you by your health care provider. Make sure you discuss any questions you have with your health care provider. Document Released: 02/12/2008 Document Revised: 06/12/2016 Document Reviewed: 06/12/2016 Elsevier Interactive Patient Education  2019 [employer].

## 2018-07-14 NOTE — Progress Notes (Signed)
Patient presents to clinic today to follow-up on chronic conditions and establish care.  Patient is accompanied by her daughter [name].  SUBJECTIVE: PMH:  Pt is a 78 yo female with pmh sig for HTN, ILD, GERD, DM II, arthritis, history of cancer, depression, seasonal allergies, HLD, kidney disease, H/O rheumatic fever, hypothyroidism, s/p L hip fracture, h/o brain tumor.  Pt previously seen by Dr. Glendale Chard.  Pt states she never got to see her provider when she made appts.  HTN: -Patient checking BP at home typically 130-140s/80s -Taking Lasix 60 mg daily, Toprol XL 50 mg daily, Benicar 20 mg daily, K. Dur 10 mEq daily -Denies headaches, chest pain, changes in vision  Interstitial lung disease: -Followed by [name] and by [facility] Pulmon[facility], Dr Tillie Fantasia. -pt states in the past her provider(s) had "given up" on her and tried to start palliative care. -States biologics were not working for her -Taking Toficitinib 5 mg twice daily and prednisone 12.5 mg every morning, Plaquenil 200 mg daily -pt states she is doing well. -Using albuterol inhaler prn  GERD: -taking protonix -notes symptoms if has beans.  Gout: -takes Allopurinol 300 mg daily -Trying to drink more water  Seasonal allergies: -May use Flonase PRN for symptoms  Rheumatoid arthritis: -Patient is followed by [facility] rheumatology, Dr. Bonne [name] -Under pain management.  Taking MS Contin 15 mg by mouth twice daily.  Also has Rx for morphine sulfate 15 mg for breakthrough pain  Spinal stenosis of lumbar region with radiculopathy: -Under pain management -Taking MS Contin 15 mg twice daily -Has another 15 mg as needed for breakthrough pain  CKD stage III: -Followed by [facility] -Last seen 3 to 4 years ago  History of DVT: -Was in left calf -Currently on Eliquis 10 mg twice daily  History of left hip fracture: -States fell 2/2 hypotension -Fractured left hip in 3 places  Diabetes type 2: -Checking FS BS  at home.  Typically 100-120s -Taking Ozempic -Last eye exam November 2019 -Last foot exam January 2020  History of brain tumor: -s/p gamma knife surgery October 2013 -Had a meningioma -States currently on Neurontin due to pain/burning/sweating of eyes, face, and mouth when off the medication  Hypothyroidism: -Taking Synthroid 50 mcg daily  H/o depression: -mostly a/w health problems  Allergies: Codeine-swelling, shaking Lisinopril-anaphylaxis (facial and throat edema) Remicade-"went into shock" felt like on fire  Allergies: Appendectomy 1968 Tonsillectomy 1963 Hysterectomy 1980 C-section 1980 Ovarian surgery 1968  Social history: Patient is married.  She is a retired Marine scientist.  Pt had 8 kids, 7 children living.  One of pt's daughters recently died 2/2 uterine cancer with mets to the lungs and the intestines.  Pt denies alcohol, tobacco, drug use.  Health Maintenance: Dental --Dr. Odis Luster Immunizations --tetanus 2018, Pneumovax 2017, influenza 2019, TB test 2019 Colonoscopy --2014 PAP --2013  Past Medical History:  Diagnosis Date  . Acute on chronic diastolic (congestive) heart failure ([place])   . Anemia    iron deficiency anemia - secondary to blood loss ( chronic)   . Arthritis    endstage changes bilateral knees/bilateral ankles.   . Asthma   . Carotid artery occlusion   . Chronic fatigue   . Chronic kidney disease   . Closed left hip fracture ([place])   . Clotting disorder ([place])    pt denies this  . Contusion of left knee    due to fall 1/14.  [name] COPD (chronic obstructive pulmonary disease) (HCC)    pulmonary fibrosis  .  Depression, reactive   . Diabetes mellitus    type II   . Diastolic dysfunction   . Difficulty in walking   . Family history of heart disease   . Generalized muscle weakness   . Gout   . High cholesterol   . History of falling   . Hypertension   . Hypothyroidism   . Interstitial lung disease ([place])   . Meningioma of left sphenoid  wing involving cavernous sinus (HCC) 02/17/2012   Continue diplopia, left eye pain and left headaches.    . Morbid obesity ([place])   . Morbid obesity ([place])   . Neuromuscular disorder ([place])    diabetic neuropathy   . Normal coronary arteries    cardiac catheterization performed  10/31/14  . RA (rheumatoid arthritis) ([place])    has been off methotreaxte since 10/13.  [name] Rheumatoid arthritis ([place])   . Shortness of breath   . Spinal stenosis of lumbar region   . Thyroid disease   . Unspecified lack of coordination   . URI (upper respiratory infection)     Past Surgical History:  Procedure Laterality Date  . ABDOMINAL HYSTERECTOMY    . BRAIN SURGERY     Gamma knife 10/13. Needs repeat spring  '14  . CARDIAC CATHETERIZATION N/A 10/31/2014   Procedure: Right/Left Heart Cath and Coronary Angiography;  Surgeon: [name], MD;  Location: [place] [facility];  Service: Cardiovascular;  Laterality: N/A;  . ESOPHAGOGASTRODUODENOSCOPY (EGD) WITH PROPOFOL N/A 09/14/2014   Procedure: ESOPHAGOGASTRODUODENOSCOPY (EGD) WITH PROPOFOL;  Surgeon: [name], MD;  Location: [facility];  Service: Endoscopy;  Laterality: N/A;  . INCISION AND DRAINAGE HIP Left 01/16/2017   Procedure: IRRIGATION AND DEBRIDEMENT LEFT HIP;  Surgeon: [name], MD;  Location: [facility];  Service: Orthopedics;  Laterality: Left;  . INTRAMEDULLARY (IM) NAIL INTERTROCHANTERIC Left 11/29/2016   Procedure: INTRAMEDULLARY (IM) NAIL INTERTROCHANTRIC;  Surgeon: [name], MD;  Location: [facility];  Service: Orthopedics;  Laterality: Left;  . OVARY SURGERY    . SHOULDER SURGERY Left   . TONSILLECTOMY  age 6  . VIDEO BRONCHOSCOPY Bilateral 05/31/2013   Procedure: VIDEO BRONCHOSCOPY WITHOUT FLUORO;  Surgeon: Brand Males, MD;  Location: [place];  Service: Cardiopulmonary;  Laterality: Bilateral;  . video bronscoscopy  2000   lung    Current Outpatient Medications on File Prior to Visit  Medication  Sig Dispense Refill  . acetaminophen (TYLENOL) 500 MG tablet Take 1,000 mg by mouth every 6 (six) hours as needed for moderate pain.     [name] albuterol (PROVENTIL HFA;VENTOLIN HFA) 108 (90 BASE) MCG/ACT inhaler Inhale 2 puffs into the lungs every 6 (six) hours as needed for wheezing or shortness of breath.    . allopurinol (ZYLOPRIM) 300 MG tablet Take 300 mg by mouth daily.    [name] apixaban (ELIQUIS) 5 MG TABS tablet Take 2 tablets (10 mg total) by mouth 2 (two) times daily. 11 tablet 0  . apixaban (ELIQUIS) 5 MG TABS tablet Take 1 tablet (5 mg total) by mouth 2 (two) times daily. 60 tablet 0  . atorvastatin (LIPITOR) 20 MG tablet Take 10 mg by mouth every evening.     [name] azithromycin (ZITHROMAX) 500 MG tablet Take 1 tablet by mouth daily. TAKE ON MON, WED & FRI  0  . Calcium Carb-Cholecalciferol (CALCIUM + D3 PO) Take 2 tablets by mouth daily with lunch.     . cholecalciferol (VITAMIN D) 1000 UNITS tablet Take 2,000 Units by mouth  daily with lunch.     . colchicine 0.6 MG tablet Take 0.6 mg by mouth daily.    . cyclobenzaprine (FLEXERIL) 5 MG tablet Take 1 tablet (5 mg total) by mouth at bedtime as needed for muscle spasms. 90 tablet 3  . diclofenac sodium (VOLTAREN) 1 % GEL Apply 1 application topically 3 (three) times daily. Both hands and knees 3 Tube 4  . docusate sodium (COLACE) 100 MG capsule Take 1 capsule (100 mg total) by mouth 2 (two) times daily. 10 capsule 0  . DULoxetine (CYMBALTA) 60 MG capsule Take 1 capsule (60 mg total) by mouth daily. 90 capsule 3  . fluticasone (FLONASE) 50 MCG/ACT nasal spray Place 2 sprays into both nostrils daily. 16 g 3  . furosemide (LASIX) 20 MG tablet Take 3 tablets (60 mg total) by mouth daily. 30 tablet 0  . gabapentin (NEURONTIN) 600 MG tablet Take two in the morning , Two mid- afternoon and two at bedtime 540 tablet 3  . guaiFENesin (MUCINEX) 600 MG 12 hr tablet Take 1,200 mg by mouth 2 (two) times daily.    . hydroxychloroquine (PLAQUENIL) 200 MG tablet  Take 200 mg by mouth daily.    [name] ipratropium-albuterol (DUONEB) 0.5-2.5 (3) MG/3ML SOLN Take 3 mLs by nebulization every 6 (six) hours as needed (cough/wheezing).     [name] levothyroxine (SYNTHROID, LEVOTHROID) 50 MCG tablet Take 1 tablet (50 mcg total) by mouth daily. 90 tablet 1  . linaclotide (LINZESS) 72 MCG capsule Take 1 capsule (72 mcg total) by mouth daily before breakfast. 90 capsule 1  . metoprolol succinate (TOPROL XL) 100 MG 24 hr tablet Take 1/2 tablet by mouth daily with or immediately following a meal. 45 tablet 1  . morphine (MS CONTIN) 15 MG 12 hr tablet Take 1 tablet (15 mg total) by mouth every 12 (twelve) hours. 60 tablet 0  . morphine (MSIR) 15 MG tablet Take 1 tablet (15 mg total) by mouth 2 (two) times daily as needed for moderate pain (Breakthrough Pain). 60 tablet 0  . Multiple Vitamin (MULTIVITAMIN WITH MINERALS) TABS Take 1 tablet by mouth daily.     [name] olmesartan (BENICAR) 20 MG tablet Take 1 tablet (20 mg total) by mouth daily. 90 tablet 1  . OXYGEN-HELIUM IN Inhale 2 L into the lungs at bedtime. And on exertion    . pantoprazole (PROTONIX) 40 MG tablet take 1 tablet by mouth once daily MINUTES BEFORE 1ST MEAL OF THE DAY 90 tablet 1  . potassium chloride (K-DUR,KLOR-CON) 10 MEQ tablet Take 10 mEq by mouth daily.     . predniSONE (DELTASONE) 5 MG tablet Take 12.5 mg by mouth every morning.   0  . Semaglutide,0.25 or 0.5MG/DOS, (OZEMPIC, 0.25 OR 0.5 MG/DOSE,) 2 MG/1.5ML SOPN Inject 0.5 mg into the skin once a week. 3 pen 1  . sodium chloride (OCEAN) 0.65 % SOLN nasal spray Place 1 spray into both nostrils as needed for congestion.    [name] Spacer/Aero-Holding Chambers (AEROCHAMBER MV) inhaler Use as instructed 1 each 0  . Tofacitinib Citrate (XELJANZ) 5 MG TABS Take 1 tablet (5 mg total) by mouth 2 (two) times daily. 180 tablet 1   No current facility-administered medications on file prior to visit.     Allergies  Allergen Reactions  . Codeine Swelling and Other (See  Comments)    Facial swelling Chest pain and swelling in legs  Facial swelling  . Infliximab Anaphylaxis    "sent me into shock" "sent me into  shock"  . Lisinopril Swelling    Face and neck swelling Face and neck swelling    Family History  Problem Relation Age of Onset  . Diabetes Mother   . Heart attack Mother   . Hypertension Father   . Lung cancer Father   . Diabetes Sister   . Diabetes Brother   . Hypertension Brother   . Heart disease Brother   . Heart attack Brother   . Kidney cancer Brother   . Uterine cancer Daughter   . Breast cancer Sister   . Rheum arthritis Maternal Uncle   . Gout Brother   . Kidney failure Brother        x 5    Social History   Socioeconomic History  . Marital status: Married    Spouse name: Not on file  . Number of children: 6  . Years of education: college  . Highest education level: Not on file  Occupational History  . Occupation: retired Ship broker  . Financial resource strain: Not on file  . Food insecurity:    Worry: Not on file    Inability: Not on file  . Transportation needs:    Medical: Not on file    Non-medical: Not on file  Tobacco Use  . Smoking status: Never Smoker  . Smokeless tobacco: Never Used  Substance and Sexual Activity  . Alcohol use: No  . Drug use: No  . Sexual activity: Not Currently  Lifestyle  . Physical activity:    Days per week: Not on file    Minutes per session: Not on file  . Stress: Not on file  Relationships  . Social connections:    Talks on phone: Not on file    Gets together: Not on file    Attends religious service: Not on file    Active member of club or organization: Not on file    Attends meetings of clubs or organizations: Not on file    Relationship status: Not on file  . Intimate partner violence:    Fear of current or ex partner: Not on file    Emotionally abused: Not on file    Physically abused: Not on file    Forced sexual activity: Not on file  Other  Topics Concern  . Not on file  Social History Narrative   Patient consumes 2-3 cups coffee per day.    ROS General: Denies fever, chills, night sweats, changes in weight, changes in appetite HEENT: Denies headaches, ear pain, changes in vision, rhinorrhea, sore throat CV: Denies CP, palpitations, SOB, orthopnea Pulm: Denies SOB, cough, wheezing GI: Denies abdominal pain, nausea, vomiting, diarrhea, constipation GU: Denies dysuria, hematuria, frequency, vaginal discharge Msk: Denies muscle cramps   +joint pains Neuro: Denies weakness, numbness, tingling Skin: Denies rashes, bruising Psych: Denies depression, anxiety, hallucinations  BP 138/82 (BP Location: Right Arm, Patient Position: Sitting, Cuff Size: Large)   Pulse 87   Temp 98.3 F (36.8 C) (Oral)   Wt 276 lb 9.6 oz (125.5 kg)   SpO2 94%   BMI 47.48 kg/m   Physical Exam Gen. Pleasant, well developed, well-nourished, in NAD HEENT - wearing glasses, [place]/AT, PERRL, conjunctive clear, no scleral icterus, no nasal drainage, pharynx without erythema or exudate.  TMs normal b/l. Neck: No JVD, no thyromegaly, no carotid bruits Lungs: no use of accessory muscles, CTAB, no wheezes, rales or rhonchi Cardiovascular: RRR, No r/g/m, no peripheral edema Abdomen: BS present, soft, nontender,nondistended Musculoskeletal: No deformities, moves  all four extremities, no cyanosis or clubbing, normal tone Neuro:  A&Ox3, CN II-XII intact, using walker Skin:  Warm, dry, intact, no lesions Psych: normal affect, mood appropriate  Recent Results (from the past 2160 hour(s))  ToxAssure Select Plus     Status: None   Collection Time: 05/04/18 11:30 AM  Result Value Ref Range   Summary FINAL     Comment: ==================================================================== 6-Acetylmorphine,ToxAssure Add TOXASSURE SELECT,+ANTIDEPR,UR ==================================================================== Test                             Result        Flag       Units Drug Present   Morphine                       >4310                   ng/mg creat   Normorphine                    230                     ng/mg creat    Potential sources of large amounts of morphine in the absence of    codeine include administration of morphine or use of heroin.    Normorphine is an expected metabolite of morphine.   Hydromorphone                  42                      ng/mg creat    Hydromorphone may be present as a metabolite of morphine;    concentrations of hydromorphone rarely exceed 5% of the morphine    concentration when this is the source of hydromorphone.   Cyclobenzaprine                PRESENT   Desmethylcyclobenzaprine       PRESENT    Desmethylcyclobenzaprine is an expected me tabolite of    cyclobenzaprine.   Duloxetine                     PRESENT ==================================================================== Test                      Result    Flag   Units      Ref Range   Creatinine              232              mg/dL      >=20 ==================================================================== Declared Medications:  Medication list was not provided. ==================================================================== For clinical consultation, please call [phone]. ====================================================================   6-Acetylmorphine,ToxAssure Add     Status: None   Collection Time: 05/04/18 11:30 AM  Result Value Ref Range   6-ACETYLMORPHINE Negative    6-acetylmorphine Not Detected ng/mg creat    Comment: Testing Threshold:  10 ng/mL This test was developed and its performance characteristics determined by [facility].  It has not been cleared or approved by the Food and Drug Administration.   Basic metabolic panel     Status: Abnormal   Collection Time: 05/26/18 12:59 PM  Result Value Ref Range   Glucose 126 (H) 65 - 99 mg/dL   BUN 20 8 - 27 mg/dL   Creatinine, Ser 1.16 (  H) 0.57 - 1.00  mg/dL   GFR calc non Af Amer 46 (L) >59 mL/min/1.73   GFR calc Af Amer 53 (L) >59 mL/min/1.73   BUN/Creatinine Ratio 17 12 - 28   Sodium 148 (H) 134 - 144 mmol/L   Potassium 3.9 3.5 - 5.2 mmol/L   Chloride 100 96 - 106 mmol/L   CO2 29 20 - 29 mmol/L   Calcium 8.6 (L) 8.7 - 10.3 mg/dL  Hemoglobin A1c     Status: Abnormal   Collection Time: 05/26/18 12:59 PM  Result Value Ref Range   Hgb A1c MFr Bld 6.4 (H) 4.8 - 5.6 %    Comment:          Prediabetes: 5.7 - 6.4          Diabetes: >6.4          Glycemic control for adults with diabetes: <7.0    Est. average glucose Bld gHb Est-mCnc 137 mg/dL  Hepatic function panel     Status: Abnormal   Collection Time: 05/26/18 12:59 PM  Result Value Ref Range   Total Protein 6.1 6.0 - 8.5 g/dL   Albumin 4.3 3.5 - 4.8 g/dL    Comment:     **Effective June 07, 2018 Albumin reference**       interval will be changing to:              Age                Female          Female           0 -  7 days        3.6 - 4.9      3.6 - 4.9           8 - 30 days        3.4 - 4.7      3.4 - 4.7           1 -  6 month       3.7 - 4.8      3.7 - 4.8    7 months -  2 years       3.9 - 5.0      3.9 - 5.0           3 -  5 years       4.0 - 5.0      4.0 - 5.0           6 - 12 years       4.1 - 5.0      4.0 - 5.0          13 - 30 years       4.1 - 5.2      3.9 - 5.0          31 - 50 years       4.0 - 5.0      3.8 - 4.8          51 - 60 years       3.8 - 4.9      3.8 - 4.9          61 - 70 years       3.8 - 4.8      3.8 - 4.8          71 - 80 years       3.7 - 4.7  3.7 - 4.7          81 - 89 years       3.6 - 4.6      3.6 - 4.6              >89 years       3.5 - 4.6      3.5 - 4.6    Bilirubin Total 0.7 0.0 - 1.2 mg/dL   Bilirubin, Direct 0.25 0.00 - 0.40 mg/dL   Alkaline Phosphatase 82 39 - 117 IU/L   AST 30 0 - 40 IU/L   ALT 54 (H) 0 - 32 IU/L  Lipid panel     Status: None   Collection Time: 05/26/18 12:59 PM  Result Value Ref Range   Cholesterol,  Total 150 100 - 199 mg/dL   Triglycerides 111 0 - 149 mg/dL   HDL 65 >39 mg/dL   VLDL Cholesterol Cal 22 5 - 40 mg/dL   LDL Calculated 63 0 - 99 mg/dL   Chol/HDL Ratio 2.3 0.0 - 4.4 ratio    Comment:                                   T. Chol/HDL Ratio                                             Men  Women                               1/2 Avg.Risk  3.4    3.3                                   Avg.Risk  5.0    4.4                                2X Avg.Risk  9.6    7.1                                3X Avg.Risk 23.4   11.0   TSH     Status: None   Collection Time: 05/26/18 12:59 PM  Result Value Ref Range   TSH 3.510 0.450 - 4.500 uIU/mL  T3     Status: None   Collection Time: 05/26/18 12:59 PM  Result Value Ref Range   T3, Total 99 71 - 180 ng/dL  T4, Free     Status: None   Collection Time: 05/26/18 12:59 PM  Result Value Ref Range   Free T4 1.52 0.82 - 1.77 ng/dL  Basic metabolic panel     Status: Abnormal   Collection Time: 06/02/18  1:06 PM  Result Value Ref Range   Sodium 138 135 - 145 mmol/L   Potassium 3.6 3.5 - 5.1 mmol/L   Chloride 97 (L) 98 - 111 mmol/L   CO2 29 22 - 32 mmol/L   Glucose, Bld 204 (H) 70 - 99 mg/dL   BUN 22 8 - 23 mg/dL   Creatinine, Ser 1.43 (H) 0.44 - 1.00 mg/dL  Calcium 8.6 (L) 8.9 - 10.3 mg/dL   GFR calc non Af Amer 35 (L) >60 mL/min   GFR calc Af Amer 41 (L) >60 mL/min   Anion gap 12 5 - 15    Comment: Performed at [facility] [address], [place], [place] [address]  CBC     Status: None   Collection Time: 06/02/18  1:06 PM  Result Value Ref Range   WBC 5.7 4.0 - 10.5 K/uL   RBC 4.07 3.87 - 5.11 MIL/uL   Hemoglobin 12.2 12.0 - 15.0 g/dL   HCT 40.6 36.0 - 46.0 %   MCV 99.8 80.0 - 100.0 fL   MCH 30.0 26.0 - 34.0 pg   MCHC 30.0 30.0 - 36.0 g/dL   RDW 15.2 11.5 - 15.5 %   Platelets 167 150 - 400 K/uL   nRBC 0.0 0.0 - 0.2 %    Comment: Performed at [facility], [place] [address] [place], [place] [address]  I-stat troponin,  ED     Status: None   Collection Time: 06/02/18  1:23 PM  Result Value Ref Range   Troponin i, poc 0.01 0.00 - 0.08 ng/mL   Comment 3            Comment: Due to the release kinetics of cTnI, a negative result within the first hours of the onset of symptoms does not rule out myocardial infarction with certainty. If myocardial infarction is still suspected, repeat the test at appropriate intervals.   Brain natriuretic peptide     Status: None   Collection Time: 06/02/18  3:46 PM  Result Value Ref Range   B Natriuretic Peptide 7.9 0.0 - 100.0 pg/mL    Comment: Performed at [facility], [place] [address], [place], [place] [address]  Glucose, capillary     Status: Abnormal   Collection Time: 06/02/18  6:11 PM  Result Value Ref Range   Glucose-Capillary 107 (H) 70 - 99 mg/dL   Comment 1 Notify RN   Troponin I - Now Then Q6H     Status: None   Collection Time: 06/02/18  6:29 PM  Result Value Ref Range   Troponin I <0.03 <0.03 ng/mL    Comment: Performed at [facility] [address], [place], [place] [address]  Urinalysis, Routine w reflex microscopic     Status: Abnormal   Collection Time: 06/02/18  6:50 PM  Result Value Ref Range   Color, Urine YELLOW YELLOW   APPearance CLOUDY (A) CLEAR   Specific Gravity, Urine 1.016 1.005 - 1.030   pH 5.0 5.0 - 8.0   Glucose, UA NEGATIVE NEGATIVE mg/dL   Hgb urine dipstick SMALL (A) NEGATIVE   Bilirubin Urine NEGATIVE NEGATIVE   Ketones, ur NEGATIVE NEGATIVE mg/dL   Protein, ur NEGATIVE NEGATIVE mg/dL   Nitrite NEGATIVE NEGATIVE   Leukocytes, UA LARGE (A) NEGATIVE   RBC / HPF 0-5 0 - 5 RBC/hpf   WBC, UA >50 (H) 0 - 5 WBC/hpf   Bacteria, UA FEW (A) NONE SEEN   Squamous Epithelial / LPF 0-5 0 - 5   WBC Clumps PRESENT    Mucus PRESENT    Hyaline Casts, UA PRESENT     Comment: Performed at [facility], [place] [address] [place], [place] [address]  Glucose, capillary     Status: Abnormal   Collection Time: 06/02/18  9:20 PM   Result Value Ref Range   Glucose-Capillary 147 (H)  70 - 99 mg/dL  Culture, Urine     Status: None   Collection Time: 06/02/18 10:57 PM  Result Value Ref Range   Specimen Description URINE, CLEAN CATCH    Special Requests      NONE Performed at [facility], [place] [address], [place], [place] [address]    Culture      Multiple bacterial morphotypes present, none predominant. Suggest appropriate recollection if clinically indicated.   Report Status 06/04/2018 FINAL   Troponin I - Now Then Q6H     Status: None   Collection Time: 06/02/18 11:33 PM  Result Value Ref Range   Troponin I <0.03 <0.03 ng/mL    Comment: Performed at [facility] [address], [place], [place] [address]  Basic metabolic panel     Status: Abnormal   Collection Time: 06/03/18  4:35 AM  Result Value Ref Range   Sodium 143 135 - 145 mmol/L   Potassium 3.0 (L) 3.5 - 5.1 mmol/L   Chloride 104 98 - 111 mmol/L   CO2 29 22 - 32 mmol/L   Glucose, Bld 104 (H) 70 - 99 mg/dL   BUN 18 8 - 23 mg/dL   Creatinine, Ser 1.18 (H) 0.44 - 1.00 mg/dL   Calcium 8.3 (L) 8.9 - 10.3 mg/dL   GFR calc non Af Amer 45 (L) >60 mL/min   GFR calc Af Amer 52 (L) >60 mL/min   Anion gap 10 5 - 15    Comment: Performed at [facility] [address] [place], [place] [address]  CBC     Status: Abnormal   Collection Time: 06/03/18  4:35 AM  Result Value Ref Range   WBC 5.1 4.0 - 10.5 K/uL   RBC 3.58 (L) 3.87 - 5.11 MIL/uL   Hemoglobin 11.2 (L) 12.0 - 15.0 g/dL   HCT 35.0 (L) 36.0 - 46.0 %   MCV 97.8 80.0 - 100.0 fL   MCH 31.3 26.0 - 34.0 pg   MCHC 32.0 30.0 - 36.0 g/dL   RDW 15.2 11.5 - 15.5 %   Platelets 157 150 - 400 K/uL   nRBC 0.0 0.0 - 0.2 %    Comment: Performed at [facility], [place] [address], [place], [place] [address]  Glucose, capillary     Status: None   Collection Time: 06/03/18  6:47 AM  Result Value Ref Range   Glucose-Capillary 88 70 - 99 mg/dL  Glucose, capillary     Status: Abnormal    Collection Time: 06/03/18 11:14 AM  Result Value Ref Range   Glucose-Capillary 119 (H) 70 - 99 mg/dL  Magnesium     Status: None   Collection Time: 06/03/18  4:27 PM  Result Value Ref Range   Magnesium 1.8 1.7 - 2.4 mg/dL    Comment: Performed at [facility], [place] [address] [place], [place] [address]  Glucose, capillary     Status: Abnormal   Collection Time: 06/03/18  4:33 PM  Result Value Ref Range   Glucose-Capillary 142 (H) 70 - 99 mg/dL   Comment 1 Notify RN    Comment 2 Document in Chart   Heparin level (unfractionated)     Status: Abnormal   Collection Time: 06/03/18 11:39 PM  Result Value Ref Range   Heparin Unfractionated 0.78 (H) 0.30 - 0.70 IU/mL    Comment: (NOTE) If heparin results are below expected values, and patient dosage has  been confirmed, suggest follow up  testing of antithrombin III levels. Performed at [facility], [place] [address], [place], [place] [address]   ECHOCARDIOGRAM COMPLETE     Status: None   Collection Time: 06/04/18  9:53 AM  Result Value Ref Range   Weight 4,303.38 oz   Height 62 in   BP 110/64 mmHg  CBC     Status: Abnormal   Collection Time: 06/04/18 10:40 AM  Result Value Ref Range   WBC 5.2 4.0 - 10.5 K/uL   RBC 3.68 (L) 3.87 - 5.11 MIL/uL   Hemoglobin 11.3 (L) 12.0 - 15.0 g/dL   HCT 36.8 36.0 - 46.0 %   MCV 100.0 80.0 - 100.0 fL   MCH 30.7 26.0 - 34.0 pg   MCHC 30.7 30.0 - 36.0 g/dL   RDW 15.2 11.5 - 15.5 %   Platelets 158 150 - 400 K/uL   nRBC 0.0 0.0 - 0.2 %    Comment: Performed at [facility], [place] [address], [place], [place] [address]  Basic metabolic panel     Status: Abnormal   Collection Time: 06/04/18 10:40 AM  Result Value Ref Range   Sodium 140 135 - 145 mmol/L   Potassium 3.4 (L) 3.5 - 5.1 mmol/L   Chloride 100 98 - 111 mmol/L   CO2 29 22 - 32 mmol/L   Glucose, Bld 105 (H) 70 - 99 mg/dL   BUN 13 8 - 23 mg/dL   Creatinine, Ser 1.14 (H) 0.44 - 1.00 mg/dL   Calcium 8.5 (L) 8.9 - 10.3 mg/dL    GFR calc non Af Amer 47 (L) >60 mL/min   GFR calc Af Amer 54 (L) >60 mL/min   Anion gap 11 5 - 15    Comment: Performed at [facility] [address], [place], [place] [address]  Heparin level (unfractionated)     Status: Abnormal   Collection Time: 06/04/18 10:40 AM  Result Value Ref Range   Heparin Unfractionated 0.75 (H) 0.30 - 0.70 IU/mL    Comment: (NOTE) If heparin results are below expected values, and patient dosage has  been confirmed, suggest follow up testing of antithrombin III levels. Performed at [facility], [place] [address], [place], [place] [address]   Glucose, capillary     Status: Abnormal   Collection Time: 06/04/18 11:48 AM  Result Value Ref Range   Glucose-Capillary 129 (H) 70 - 99 mg/dL  Glucose, capillary     Status: Abnormal   Collection Time: 06/04/18  4:44 PM  Result Value Ref Range   Glucose-Capillary 126 (H) 70 - 99 mg/dL  Glucose, capillary     Status: Abnormal   Collection Time: 06/04/18  9:33 PM  Result Value Ref Range   Glucose-Capillary 112 (H) 70 - 99 mg/dL  CBC     Status: Abnormal   Collection Time: 06/05/18  5:35 AM  Result Value Ref Range   WBC 8.4 4.0 - 10.5 K/uL   RBC 3.58 (L) 3.87 - 5.11 MIL/uL   Hemoglobin 11.0 (L) 12.0 - 15.0 g/dL   HCT 35.9 (L) 36.0 - 46.0 %   MCV 100.3 (H) 80.0 - 100.0 fL   MCH 30.7 26.0 - 34.0 pg   MCHC 30.6 30.0 - 36.0 g/dL   RDW 15.2 11.5 - 15.5 %   Platelets 160 150 - 400 K/uL   nRBC 0.0 0.0 - 0.2 %    Comment: Performed at [facility], [place]. [address], [place], [place]  [address]  Basic metabolic panel     Status: Abnormal   Collection Time: 06/05/18  5:35 AM  Result Value Ref Range   Sodium 141 135 - 145 mmol/L   Potassium 4.2 3.5 - 5.1 mmol/L   Chloride 101 98 - 111 mmol/L   CO2 31 22 - 32 mmol/L   Glucose, Bld 105 (H) 70 - 99 mg/dL   BUN 13 8 - 23 mg/dL   Creatinine, Ser 1.06 (H) 0.44 - 1.00 mg/dL   Calcium 8.4 (L) 8.9 - 10.3 mg/dL   GFR calc non Af Amer 51 (L) >60 mL/min   GFR  calc Af Amer 59 (L) >60 mL/min   Anion gap 9 5 - 15    Comment: Performed at [facility] [address] [place], [place] [address]  Glucose, capillary     Status: None   Collection Time: 06/05/18  6:07 AM  Result Value Ref Range   Glucose-Capillary 93 70 - 99 mg/dL  Glucose, capillary     Status: None   Collection Time: 06/05/18  7:49 AM  Result Value Ref Range   Glucose-Capillary 77 70 - 99 mg/dL  Glucose, capillary     Status: Abnormal   Collection Time: 06/05/18 12:03 PM  Result Value Ref Range   Glucose-Capillary 101 (H) 70 - 99 mg/dL  BMP8+eGFR     Status: Abnormal   Collection Time: 06/11/18  4:04 PM  Result Value Ref Range   Glucose 124 (H) 65 - 99 mg/dL   BUN 17 8 - 27 mg/dL   Creatinine, Ser 1.22 (H) 0.57 - 1.00 mg/dL   GFR calc non Af Amer 43 (L) >59 mL/min/1.73   GFR calc Af Amer 50 (L) >59 mL/min/1.73   BUN/Creatinine Ratio 14 12 - 28   Sodium 143 134 - 144 mmol/L   Potassium 4.6 3.5 - 5.2 mmol/L   Chloride 99 96 - 106 mmol/L   CO2 28 20 - 29 mmol/L   Calcium 8.7 8.7 - 10.3 mg/dL    Assessment/Plan: Essential hypertension -Fairly controlled -continue current meds including Lasix 60 mg daily, Toprol-XL, K-Dur 10 mEq, olmesartan 20 mg -discussed lifestyle modifications -Continue follow-up with cardiology, Dr. Gwenlyn Found  ILD (interstitial lung disease) ([place]) -stable -continue current medications -continue f/u with Pulmonology locally and at [facility]  Gastroesophageal reflux disease, esophagitis presence not specified -continue protonix -avoid foods known to cause issues.  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 -We provided refills for any medications we will prescribe as needed. -We addressed current concerns per orders and patient instructions. -We have asked for records for pertinent exams, studies, vaccines and notes  from previous providers. -We have advised patient to follow up per instructions below.  F/u in the next few months, sooner if needed  Grier Mitts, MD

## 2018-07-15 DIAGNOSIS — J449 Chronic obstructive pulmonary disease, unspecified: Secondary | ICD-10-CM | POA: Diagnosis not present

## 2018-07-15 DIAGNOSIS — I130 Hypertensive heart and chronic kidney disease with heart failure and stage 1 through stage 4 chronic kidney disease, or unspecified chronic kidney disease: Secondary | ICD-10-CM | POA: Diagnosis not present

## 2018-07-15 DIAGNOSIS — E1140 Type 2 diabetes mellitus with diabetic neuropathy, unspecified: Secondary | ICD-10-CM | POA: Diagnosis not present

## 2018-07-15 DIAGNOSIS — I5032 Chronic diastolic (congestive) heart failure: Secondary | ICD-10-CM | POA: Diagnosis not present

## 2018-07-15 DIAGNOSIS — I824Z2 Acute embolism and thrombosis of unspecified deep veins of left distal lower extremity: Secondary | ICD-10-CM | POA: Diagnosis not present

## 2018-07-15 DIAGNOSIS — M48061 Spinal stenosis, lumbar region without neurogenic claudication: Secondary | ICD-10-CM | POA: Diagnosis not present

## 2018-07-19 ENCOUNTER — Encounter: Payer: Self-pay | Admitting: Family Medicine

## 2018-07-19 DIAGNOSIS — E039 Hypothyroidism, unspecified: Secondary | ICD-10-CM | POA: Diagnosis not present

## 2018-07-19 DIAGNOSIS — M170 Bilateral primary osteoarthritis of knee: Secondary | ICD-10-CM | POA: Diagnosis not present

## 2018-07-19 DIAGNOSIS — K5903 Drug induced constipation: Secondary | ICD-10-CM | POA: Diagnosis not present

## 2018-07-19 DIAGNOSIS — Z7901 Long term (current) use of anticoagulants: Secondary | ICD-10-CM | POA: Diagnosis not present

## 2018-07-19 DIAGNOSIS — M109 Gout, unspecified: Secondary | ICD-10-CM | POA: Diagnosis not present

## 2018-07-19 DIAGNOSIS — I5032 Chronic diastolic (congestive) heart failure: Secondary | ICD-10-CM | POA: Diagnosis not present

## 2018-07-19 DIAGNOSIS — N189 Chronic kidney disease, unspecified: Secondary | ICD-10-CM | POA: Diagnosis not present

## 2018-07-19 DIAGNOSIS — Z7952 Long term (current) use of systemic steroids: Secondary | ICD-10-CM | POA: Diagnosis not present

## 2018-07-19 DIAGNOSIS — M069 Rheumatoid arthritis, unspecified: Secondary | ICD-10-CM | POA: Diagnosis not present

## 2018-07-19 DIAGNOSIS — M5416 Radiculopathy, lumbar region: Secondary | ICD-10-CM | POA: Diagnosis not present

## 2018-07-19 DIAGNOSIS — Z8701 Personal history of pneumonia (recurrent): Secondary | ICD-10-CM | POA: Diagnosis not present

## 2018-07-19 DIAGNOSIS — Z9981 Dependence on supplemental oxygen: Secondary | ICD-10-CM | POA: Diagnosis not present

## 2018-07-19 DIAGNOSIS — E1122 Type 2 diabetes mellitus with diabetic chronic kidney disease: Secondary | ICD-10-CM | POA: Diagnosis not present

## 2018-07-19 DIAGNOSIS — I959 Hypotension, unspecified: Secondary | ICD-10-CM | POA: Diagnosis not present

## 2018-07-19 DIAGNOSIS — I130 Hypertensive heart and chronic kidney disease with heart failure and stage 1 through stage 4 chronic kidney disease, or unspecified chronic kidney disease: Secondary | ICD-10-CM | POA: Diagnosis not present

## 2018-07-19 DIAGNOSIS — F329 Major depressive disorder, single episode, unspecified: Secondary | ICD-10-CM | POA: Diagnosis not present

## 2018-07-19 DIAGNOSIS — E1140 Type 2 diabetes mellitus with diabetic neuropathy, unspecified: Secondary | ICD-10-CM | POA: Diagnosis not present

## 2018-07-19 DIAGNOSIS — M48061 Spinal stenosis, lumbar region without neurogenic claudication: Secondary | ICD-10-CM | POA: Diagnosis not present

## 2018-07-19 DIAGNOSIS — I824Z2 Acute embolism and thrombosis of unspecified deep veins of left distal lower extremity: Secondary | ICD-10-CM | POA: Diagnosis not present

## 2018-07-19 DIAGNOSIS — J9611 Chronic respiratory failure with hypoxia: Secondary | ICD-10-CM | POA: Diagnosis not present

## 2018-07-19 DIAGNOSIS — M19071 Primary osteoarthritis, right ankle and foot: Secondary | ICD-10-CM | POA: Diagnosis not present

## 2018-07-19 DIAGNOSIS — M19072 Primary osteoarthritis, left ankle and foot: Secondary | ICD-10-CM | POA: Diagnosis not present

## 2018-07-19 DIAGNOSIS — J8410 Pulmonary fibrosis, unspecified: Secondary | ICD-10-CM | POA: Diagnosis not present

## 2018-07-19 DIAGNOSIS — J449 Chronic obstructive pulmonary disease, unspecified: Secondary | ICD-10-CM | POA: Diagnosis not present

## 2018-07-19 DIAGNOSIS — D509 Iron deficiency anemia, unspecified: Secondary | ICD-10-CM | POA: Diagnosis not present

## 2018-07-20 DIAGNOSIS — M48061 Spinal stenosis, lumbar region without neurogenic claudication: Secondary | ICD-10-CM | POA: Diagnosis not present

## 2018-07-20 DIAGNOSIS — I824Z2 Acute embolism and thrombosis of unspecified deep veins of left distal lower extremity: Secondary | ICD-10-CM | POA: Diagnosis not present

## 2018-07-20 DIAGNOSIS — I5032 Chronic diastolic (congestive) heart failure: Secondary | ICD-10-CM | POA: Diagnosis not present

## 2018-07-20 DIAGNOSIS — J449 Chronic obstructive pulmonary disease, unspecified: Secondary | ICD-10-CM | POA: Diagnosis not present

## 2018-07-20 DIAGNOSIS — I130 Hypertensive heart and chronic kidney disease with heart failure and stage 1 through stage 4 chronic kidney disease, or unspecified chronic kidney disease: Secondary | ICD-10-CM | POA: Diagnosis not present

## 2018-07-20 DIAGNOSIS — E1140 Type 2 diabetes mellitus with diabetic neuropathy, unspecified: Secondary | ICD-10-CM | POA: Diagnosis not present

## 2018-07-21 ENCOUNTER — Ambulatory Visit: Payer: Medicare Other

## 2018-07-21 ENCOUNTER — Telehealth: Payer: Self-pay

## 2018-07-21 ENCOUNTER — Ambulatory Visit: Payer: Medicare Other | Admitting: Nurse Practitioner

## 2018-07-21 DIAGNOSIS — M48061 Spinal stenosis, lumbar region without neurogenic claudication: Secondary | ICD-10-CM | POA: Diagnosis not present

## 2018-07-21 DIAGNOSIS — E1140 Type 2 diabetes mellitus with diabetic neuropathy, unspecified: Secondary | ICD-10-CM | POA: Diagnosis not present

## 2018-07-21 DIAGNOSIS — I5032 Chronic diastolic (congestive) heart failure: Secondary | ICD-10-CM | POA: Diagnosis not present

## 2018-07-21 DIAGNOSIS — J449 Chronic obstructive pulmonary disease, unspecified: Secondary | ICD-10-CM | POA: Diagnosis not present

## 2018-07-21 DIAGNOSIS — I824Z2 Acute embolism and thrombosis of unspecified deep veins of left distal lower extremity: Secondary | ICD-10-CM | POA: Diagnosis not present

## 2018-07-21 DIAGNOSIS — I130 Hypertensive heart and chronic kidney disease with heart failure and stage 1 through stage 4 chronic kidney disease, or unspecified chronic kidney disease: Secondary | ICD-10-CM | POA: Diagnosis not present

## 2018-07-21 NOTE — Telephone Encounter (Signed)
Attempted to call patient in regards to no show to 1030 appointment.  Appears that she may be going to another PCP office. Left message to update [place] in regards.

## 2018-07-22 DIAGNOSIS — E1140 Type 2 diabetes mellitus with diabetic neuropathy, unspecified: Secondary | ICD-10-CM | POA: Diagnosis not present

## 2018-07-22 DIAGNOSIS — I5032 Chronic diastolic (congestive) heart failure: Secondary | ICD-10-CM | POA: Diagnosis not present

## 2018-07-22 DIAGNOSIS — I130 Hypertensive heart and chronic kidney disease with heart failure and stage 1 through stage 4 chronic kidney disease, or unspecified chronic kidney disease: Secondary | ICD-10-CM | POA: Diagnosis not present

## 2018-07-22 DIAGNOSIS — M48061 Spinal stenosis, lumbar region without neurogenic claudication: Secondary | ICD-10-CM | POA: Diagnosis not present

## 2018-07-22 DIAGNOSIS — I824Z2 Acute embolism and thrombosis of unspecified deep veins of left distal lower extremity: Secondary | ICD-10-CM | POA: Diagnosis not present

## 2018-07-22 DIAGNOSIS — J449 Chronic obstructive pulmonary disease, unspecified: Secondary | ICD-10-CM | POA: Diagnosis not present

## 2018-07-23 ENCOUNTER — Telehealth: Payer: Self-pay | Admitting: Physical Medicine & Rehabilitation

## 2018-07-23 DIAGNOSIS — J449 Chronic obstructive pulmonary disease, unspecified: Secondary | ICD-10-CM | POA: Diagnosis not present

## 2018-07-23 DIAGNOSIS — I824Z2 Acute embolism and thrombosis of unspecified deep veins of left distal lower extremity: Secondary | ICD-10-CM | POA: Diagnosis not present

## 2018-07-23 DIAGNOSIS — M48061 Spinal stenosis, lumbar region without neurogenic claudication: Secondary | ICD-10-CM | POA: Diagnosis not present

## 2018-07-23 DIAGNOSIS — E1140 Type 2 diabetes mellitus with diabetic neuropathy, unspecified: Secondary | ICD-10-CM | POA: Diagnosis not present

## 2018-07-23 DIAGNOSIS — I130 Hypertensive heart and chronic kidney disease with heart failure and stage 1 through stage 4 chronic kidney disease, or unspecified chronic kidney disease: Secondary | ICD-10-CM | POA: Diagnosis not present

## 2018-07-23 DIAGNOSIS — I5032 Chronic diastolic (congestive) heart failure: Secondary | ICD-10-CM | POA: Diagnosis not present

## 2018-07-23 NOTE — Telephone Encounter (Signed)
There is no attached message

## 2018-07-26 ENCOUNTER — Encounter: Payer: Medicare Other | Attending: Physical Medicine & Rehabilitation | Admitting: Physical Medicine & Rehabilitation

## 2018-07-26 ENCOUNTER — Encounter: Payer: Self-pay | Admitting: Physical Medicine & Rehabilitation

## 2018-07-26 VITALS — BP 143/79 | HR 90 | Ht 64.0 in | Wt 273.0 lb

## 2018-07-26 DIAGNOSIS — M1712 Unilateral primary osteoarthritis, left knee: Secondary | ICD-10-CM

## 2018-07-26 DIAGNOSIS — E1122 Type 2 diabetes mellitus with diabetic chronic kidney disease: Secondary | ICD-10-CM | POA: Diagnosis not present

## 2018-07-26 DIAGNOSIS — M48062 Spinal stenosis, lumbar region with neurogenic claudication: Secondary | ICD-10-CM | POA: Diagnosis not present

## 2018-07-26 DIAGNOSIS — M069 Rheumatoid arthritis, unspecified: Secondary | ICD-10-CM | POA: Insufficient documentation

## 2018-07-26 DIAGNOSIS — Z9889 Other specified postprocedural states: Secondary | ICD-10-CM | POA: Insufficient documentation

## 2018-07-26 DIAGNOSIS — E1142 Type 2 diabetes mellitus with diabetic polyneuropathy: Secondary | ICD-10-CM | POA: Insufficient documentation

## 2018-07-26 DIAGNOSIS — M47816 Spondylosis without myelopathy or radiculopathy, lumbar region: Secondary | ICD-10-CM

## 2018-07-26 DIAGNOSIS — M1711 Unilateral primary osteoarthritis, right knee: Secondary | ICD-10-CM | POA: Diagnosis not present

## 2018-07-26 DIAGNOSIS — Z90710 Acquired absence of both cervix and uterus: Secondary | ICD-10-CM | POA: Insufficient documentation

## 2018-07-26 DIAGNOSIS — F329 Major depressive disorder, single episode, unspecified: Secondary | ICD-10-CM | POA: Insufficient documentation

## 2018-07-26 DIAGNOSIS — M7062 Trochanteric bursitis, left hip: Secondary | ICD-10-CM

## 2018-07-26 MED ORDER — MORPHINE SULFATE ER 15 MG PO TBCR: 15.0000 mg | EXTENDED_RELEASE_TABLET | Freq: Two times a day (BID) | ORAL | 0 refills | Status: DC

## 2018-07-26 MED ORDER — MORPHINE SULFATE 15 MG PO TABS: 15.0000 mg | ORAL_TABLET | Freq: Two times a day (BID) | ORAL | 0 refills | Status: DC | PRN

## 2018-07-26 NOTE — Patient Instructions (Signed)
PLEASE FEEL FREE TO CALL OUR OFFICE WITH ANY PROBLEMS OR QUESTIONS ([phone])

## 2018-07-26 NOTE — Progress Notes (Signed)
PROCEDURE NOTE  DIAGNOSIS:  OA right knee  INTERVENTION:   ZILRETTA INJECTION     After informed consent and preparation of the skin with betadine and isopropyl alcohol, I injected 32MG of zilretta dissolved into 5cc of diluent into the right knee via anterolateral approach. Contents of syring were shaken vigorously and aspiration was performed prior to injection. The patient tolerated well, and no complications were encountered. Afterward the area was cleaned and dressed. Post- injection instructions were provided including ice  if swelling or pain should occur.    Meredith Staggers, MD, [facility] 07/26/2018   MS contin and IR were refilled, due around 08/04/2018

## 2018-07-27 ENCOUNTER — Other Ambulatory Visit: Payer: Medicare Other | Admitting: Licensed Clinical Social Worker

## 2018-07-27 DIAGNOSIS — J449 Chronic obstructive pulmonary disease, unspecified: Secondary | ICD-10-CM | POA: Diagnosis not present

## 2018-07-27 DIAGNOSIS — I824Z2 Acute embolism and thrombosis of unspecified deep veins of left distal lower extremity: Secondary | ICD-10-CM | POA: Diagnosis not present

## 2018-07-27 DIAGNOSIS — Z515 Encounter for palliative care: Secondary | ICD-10-CM

## 2018-07-27 DIAGNOSIS — E1140 Type 2 diabetes mellitus with diabetic neuropathy, unspecified: Secondary | ICD-10-CM | POA: Diagnosis not present

## 2018-07-27 DIAGNOSIS — M48061 Spinal stenosis, lumbar region without neurogenic claudication: Secondary | ICD-10-CM | POA: Diagnosis not present

## 2018-07-27 DIAGNOSIS — I5032 Chronic diastolic (congestive) heart failure: Secondary | ICD-10-CM | POA: Diagnosis not present

## 2018-07-27 DIAGNOSIS — I130 Hypertensive heart and chronic kidney disease with heart failure and stage 1 through stage 4 chronic kidney disease, or unspecified chronic kidney disease: Secondary | ICD-10-CM | POA: Diagnosis not present

## 2018-07-27 NOTE — Progress Notes (Signed)
COMMUNITY PALLIATIVE CARE SW NOTE  PATIENT NAME: [name] DOB: Jul 22, 1940 MRN: [id number]  PRIMARY CARE PROVIDER: [name], MD  RESPONSIBLE PARTY:  Acct ID - Guarantor Home Phone Work Phone Relationship Acct Type  [id number] [name], [id number] Self P/F     [address], [name], [place] [address]     PLAN OF CARE and INTERVENTIONS:             1. GOALS OF CARE/ ADVANCE CARE PLANNING:  Goal is for patient to remain in her home with her family.  Patient has a DNR. 2. SOCIAL/EMOTIONAL/SPIRITUAL ASSESSMENT/ INTERVENTIONS:  SW met with patient in her home.  Her husband and son were present in the home.  Patient stated her pain was a 3 out of 10.  She stated she has had increased pain in her knees, as well as, increased swelling recently.  Symptoms are currently managed.  SW informed her a new Palliative Care team would be meeting with her and she said she understood.  SW and patient worked on establishing a Animal nutritionist for her.  Also discussed guided imagery for healing. 3. PATIENT/CAREGIVER EDUCATION/ COPING:  Patient copes by expressing her needs openly.  She will express her feelings to SW regarding the death of her daughter, but feels she needs to display strength for her family members. 4. PERSONAL EMERGENCY PLAN:  Patient has a cell phone with her at all times for emergencies. 5. COMMUNITY RESOURCES COORDINATION/ HEALTH CARE NAVIGATION:  Patient was discharged from home health physical therapy today and will discharge from OT later this week. 6. FINANCIAL/LEGAL CONCERNS/INTERVENTIONS:  None.     SOCIAL HX:  Social History   Tobacco Use  . Smoking status: Never Smoker  . Smokeless tobacco: Never Used  Substance Use Topics  . Alcohol use: No    CODE STATUS:  DNR  ADVANCED DIRECTIVES: N MOST FORM COMPLETE:  N HOSPICE EDUCATION PROVIDED:  N PPS:  Patient states her appetite is normal.  She uses a walker to ambulate. Duration of visit and  documentation:  75 minutes.      Creola Corn Caliph [place], LCSW

## 2018-07-28 ENCOUNTER — Other Ambulatory Visit: Payer: Self-pay

## 2018-07-28 ENCOUNTER — Other Ambulatory Visit: Payer: Self-pay | Admitting: Family Medicine

## 2018-07-28 MED ORDER — OLMESARTAN MEDOXOMIL 20 MG PO TABS: 20.0000 mg | ORAL_TABLET | Freq: Every day | ORAL | 1 refills | Status: DC

## 2018-07-28 MED ORDER — OLMESARTAN MEDOXOMIL 20 MG PO TABS: 20.0000 mg | ORAL_TABLET | Freq: Every day | ORAL | 0 refills | Status: DC

## 2018-07-28 NOTE — Telephone Encounter (Signed)
Requested Prescriptions  Pending Prescriptions Disp Refills  . olmesartan (BENICAR) 20 MG tablet 90 tablet 1    Sig: Take 1 tablet (20 mg total) by mouth daily.     Cardiovascular:  Angiotensin Receptor Blockers Failed - 07/28/2018  1:38 PM      Failed - Cr in normal range and within 180 days    Creatinine  Date Value Ref Range Status  02/17/2012 1.7 (H) 0.6 - 1.1 mg/dL Final   Creat  Date Value Ref Range Status  09/17/2016 1.57 (H) 0.60 - 0.93 mg/dL Final    Comment:      For patients > or = 77 years of age: The upper reference limit for Creatinine is approximately 13% higher for people identified as African-American.      Creatinine, Ser  Date Value Ref Range Status  06/11/2018 1.22 (H) 0.57 - 1.00 mg/dL Final         Failed - Last BP in normal range    BP Readings from Last 1 Encounters:  07/26/18 (!) 143/79         Passed - K in normal range and within 180 days    Potassium  Date Value Ref Range Status  06/11/2018 4.6 3.5 - 5.2 mmol/L Final  02/17/2012 4.8 3.5 - 5.1 mEq/L Final         Passed - Patient is not pregnant      Passed - Valid encounter within last 6 months    Recent Outpatient Visits          2 weeks ago Essential hypertension   [facility], Langley Adie, MD            Pt has established care with Dr Volanda Napoleon

## 2018-07-28 NOTE — Telephone Encounter (Signed)
Copied from [place] [id number]. Topic: Quick Communication - Rx Refill/Question >> Jul 28, 2018  1:26 PM Selinda Flavin B, [place] wrote: **Patient states that Dr Volanda Napoleon was supposed to fax this after her appointment on 07/14/2018 and pharmacy has not received anything. Please advise**  Medication: olmesartan (BENICAR) 20 MG tablet   Has the patient contacted their pharmacy? Yes.   (Agent: If no, request that the patient contact the pharmacy for the refill.) (Agent: If yes, when and what did the pharmacy advise?)  Preferred Pharmacy (with phone number or street name): [facility] - [place], [place] - [address]  Agent: Please be advised that RX refills may take up to 3 business days. We ask that you follow-up with your pharmacy.

## 2018-07-29 DIAGNOSIS — I824Z2 Acute embolism and thrombosis of unspecified deep veins of left distal lower extremity: Secondary | ICD-10-CM | POA: Diagnosis not present

## 2018-07-29 DIAGNOSIS — E1140 Type 2 diabetes mellitus with diabetic neuropathy, unspecified: Secondary | ICD-10-CM | POA: Diagnosis not present

## 2018-07-29 DIAGNOSIS — J449 Chronic obstructive pulmonary disease, unspecified: Secondary | ICD-10-CM | POA: Diagnosis not present

## 2018-07-29 DIAGNOSIS — M48061 Spinal stenosis, lumbar region without neurogenic claudication: Secondary | ICD-10-CM | POA: Diagnosis not present

## 2018-07-29 DIAGNOSIS — I130 Hypertensive heart and chronic kidney disease with heart failure and stage 1 through stage 4 chronic kidney disease, or unspecified chronic kidney disease: Secondary | ICD-10-CM | POA: Diagnosis not present

## 2018-07-29 DIAGNOSIS — I5032 Chronic diastolic (congestive) heart failure: Secondary | ICD-10-CM | POA: Diagnosis not present

## 2018-08-02 ENCOUNTER — Telehealth: Payer: Self-pay | Admitting: Family Medicine

## 2018-08-02 NOTE — Telephone Encounter (Unsigned)
Copied from [place] [id number]. Topic: Quick Communication - Rx Refill/Question >> Aug 02, 2018  2:22 PM Carolyn Stare wrote: Medication     New RX for Dr Volanda Napoleon    apixaban (ELIQUIS) 5 MG TABS tablet   furosemide (LASIX) 20 MG tablet    [facility]          Preferred Pharmacy (with phone number or street name): ***  Agent: Please be advised that RX refills may take up to 3 business days. We ask that you follow-up with your pharmacy.

## 2018-08-03 ENCOUNTER — Ambulatory Visit: Payer: Medicare Other | Admitting: Physical Medicine & Rehabilitation

## 2018-08-04 DIAGNOSIS — M0579 Rheumatoid arthritis with rheumatoid factor of multiple sites without organ or systems involvement: Secondary | ICD-10-CM | POA: Diagnosis not present

## 2018-08-04 DIAGNOSIS — M1A00X0 Idiopathic chronic gout, unspecified site, without tophus (tophi): Secondary | ICD-10-CM | POA: Diagnosis not present

## 2018-08-04 DIAGNOSIS — Z79899 Other long term (current) drug therapy: Secondary | ICD-10-CM | POA: Diagnosis not present

## 2018-08-06 ENCOUNTER — Other Ambulatory Visit: Payer: Self-pay | Admitting: Family Medicine

## 2018-08-06 MED ORDER — APIXABAN 5 MG PO TABS: 5.0000 mg | ORAL_TABLET | Freq: Two times a day (BID) | ORAL | 5 refills | Status: DC

## 2018-08-06 NOTE — Telephone Encounter (Signed)
Refilled eliquis.  Looks like pt was only taking the lasix for 10 days.

## 2018-08-06 NOTE — Telephone Encounter (Signed)
This Rx was last refilled by previous PCP ok to refill

## 2018-08-06 NOTE — Telephone Encounter (Signed)
Pt is calling and she has only 5 eliquis tablet

## 2018-08-09 ENCOUNTER — Telehealth: Payer: Self-pay | Admitting: Internal Medicine

## 2018-08-09 NOTE — Telephone Encounter (Signed)
Received urgent staff message from [name] needing to know if pt's CT which is currently scheduled for 3/30 is urgent or if it can be rescheduled.  MR, please advise on this. Thanks!

## 2018-08-10 ENCOUNTER — Encounter (HOSPITAL_BASED_OUTPATIENT_CLINIC_OR_DEPARTMENT_OTHER): Payer: Medicare Other | Admitting: Physical Medicine & Rehabilitation

## 2018-08-10 ENCOUNTER — Encounter: Payer: Self-pay | Admitting: Physical Medicine & Rehabilitation

## 2018-08-10 ENCOUNTER — Other Ambulatory Visit: Payer: Self-pay

## 2018-08-10 DIAGNOSIS — M1711 Unilateral primary osteoarthritis, right knee: Secondary | ICD-10-CM

## 2018-08-10 DIAGNOSIS — M47816 Spondylosis without myelopathy or radiculopathy, lumbar region: Secondary | ICD-10-CM

## 2018-08-10 DIAGNOSIS — F329 Major depressive disorder, single episode, unspecified: Secondary | ICD-10-CM | POA: Diagnosis not present

## 2018-08-10 DIAGNOSIS — M7062 Trochanteric bursitis, left hip: Secondary | ICD-10-CM

## 2018-08-10 DIAGNOSIS — M1712 Unilateral primary osteoarthritis, left knee: Secondary | ICD-10-CM | POA: Diagnosis not present

## 2018-08-10 DIAGNOSIS — M069 Rheumatoid arthritis, unspecified: Secondary | ICD-10-CM | POA: Diagnosis not present

## 2018-08-10 DIAGNOSIS — E1142 Type 2 diabetes mellitus with diabetic polyneuropathy: Secondary | ICD-10-CM | POA: Diagnosis not present

## 2018-08-10 DIAGNOSIS — E1122 Type 2 diabetes mellitus with diabetic chronic kidney disease: Secondary | ICD-10-CM | POA: Diagnosis not present

## 2018-08-10 DIAGNOSIS — Z90710 Acquired absence of both cervix and uterus: Secondary | ICD-10-CM | POA: Diagnosis not present

## 2018-08-10 DIAGNOSIS — M48062 Spinal stenosis, lumbar region with neurogenic claudication: Secondary | ICD-10-CM | POA: Diagnosis not present

## 2018-08-10 MED ORDER — MORPHINE SULFATE ER 15 MG PO TBCR: 15.0000 mg | EXTENDED_RELEASE_TABLET | Freq: Two times a day (BID) | ORAL | 0 refills | Status: DC

## 2018-08-10 MED ORDER — MORPHINE SULFATE 15 MG PO TABS: 15.0000 mg | ORAL_TABLET | Freq: Two times a day (BID) | ORAL | 0 refills | Status: DC | PRN

## 2018-08-10 MED ORDER — APIXABAN 5 MG PO TABS: 5.0000 mg | ORAL_TABLET | Freq: Two times a day (BID) | ORAL | 0 refills | Status: DC

## 2018-08-10 MED ORDER — FUROSEMIDE 20 MG PO TABS: 60.0000 mg | ORAL_TABLET | Freq: Every day | ORAL | 1 refills | Status: DC

## 2018-08-10 NOTE — Telephone Encounter (Signed)
She is immunocompromised. Cancel CT (last juley 2016) . Do one in July /aug 2020 and return for ov.

## 2018-08-10 NOTE — Patient Instructions (Signed)
PLEASE FEEL FREE TO CALL OUR OFFICE WITH ANY PROBLEMS OR QUESTIONS ([phone])      

## 2018-08-10 NOTE — Telephone Encounter (Signed)
Spoke with pt states that she is still taking lasix, pt refill sent to pharmacy

## 2018-08-10 NOTE — Progress Notes (Signed)
PROCEDURE NOTE  DIAGNOSIS:  OA left knee  INTERVENTION:   ZILRETTA INJECTION     After informed consent and preparation of the skin with betadine and isopropyl alcohol, I injected 32MG of zilretta dissolved into 5cc of diluent into the left knee via anterolateral approach. Contents of syring were shaken vigorously and aspiration was performed prior to injection. The patient tolerated well, and no complications were encountered. Afterward the area was cleaned and dressed. Post- injection instructions were provided including ice  if swelling or pain should occur.     Additionally, I provided RF of MS IR and CR for early April    Meredith Staggers, MD, [facility] 08/10/2018

## 2018-08-10 NOTE — Telephone Encounter (Signed)
Will send a staff message to [place]. Recall has been placed to get patient scheduled in July/Aug in [facility] clinic.    Will close this encounter.

## 2018-08-12 DIAGNOSIS — I130 Hypertensive heart and chronic kidney disease with heart failure and stage 1 through stage 4 chronic kidney disease, or unspecified chronic kidney disease: Secondary | ICD-10-CM | POA: Diagnosis not present

## 2018-08-12 DIAGNOSIS — E1140 Type 2 diabetes mellitus with diabetic neuropathy, unspecified: Secondary | ICD-10-CM | POA: Diagnosis not present

## 2018-08-12 DIAGNOSIS — I5032 Chronic diastolic (congestive) heart failure: Secondary | ICD-10-CM | POA: Diagnosis not present

## 2018-08-12 DIAGNOSIS — M48061 Spinal stenosis, lumbar region without neurogenic claudication: Secondary | ICD-10-CM | POA: Diagnosis not present

## 2018-08-12 DIAGNOSIS — J449 Chronic obstructive pulmonary disease, unspecified: Secondary | ICD-10-CM | POA: Diagnosis not present

## 2018-08-12 DIAGNOSIS — I824Z2 Acute embolism and thrombosis of unspecified deep veins of left distal lower extremity: Secondary | ICD-10-CM | POA: Diagnosis not present

## 2018-08-16 ENCOUNTER — Other Ambulatory Visit: Payer: Medicare Other

## 2018-08-18 ENCOUNTER — Other Ambulatory Visit: Payer: Self-pay | Admitting: Family Medicine

## 2018-08-19 NOTE — Telephone Encounter (Signed)
Spoke with pt states that she has received her mail order fro her Eliquis and she is set for now.

## 2018-08-19 NOTE — Telephone Encounter (Signed)
This was short term, pt was waiting on her mail order to arrive. Pt received her Rx refill

## 2018-08-26 ENCOUNTER — Telehealth: Payer: Self-pay

## 2018-08-26 NOTE — Telephone Encounter (Signed)
Telephone call to patient to schedule Telemed visit.  Patient in agreement with TELEMED visit 08-31-18 at 11:00 AM.  TELEMED Zoom invite emailed to patient.  Patient reports she has been staying home and denies having any needs at the present time.

## 2018-08-31 ENCOUNTER — Other Ambulatory Visit: Payer: Self-pay

## 2018-08-31 ENCOUNTER — Other Ambulatory Visit: Payer: Medicare Other

## 2018-08-31 DIAGNOSIS — Z515 Encounter for palliative care: Secondary | ICD-10-CM

## 2018-08-31 NOTE — Progress Notes (Signed)
COMMUNITY PALLIATIVE CARE SW NOTE  PATIENT NAME: [name] DOB: [birth year] MRN: [id number]  PRIMARY CARE PROVIDER: [name], MD  RESPONSIBLE PARTY:  Acct ID - Guarantor Home Phone Work Phone Relationship Acct Type  [id number] ERMAL, [phone] Self P/F     [address], [name], [place] [address]     PLAN OF CARE and INTERVENTIONS:             1. GOALS OF CARE/ ADVANCE CARE PLANNING:  Goal is to remain at home. Patient has DNR in home. 2. SOCIAL/EMOTIONAL/SPIRITUAL ASSESSMENT/ INTERVENTIONS:  SW and RN met with patient for TELEHEALTH visit. Patient reports doing "okay". Patient explained that she had a tooth extraction last week and was in a lot of pain but is no longer taking pain medications.  Patient reports swelling and bruising on her face from this extraction. Continues to put ice on her face for short periods. Patient said she also has a rash on her feet/ankles. RN addressed and encouraged patient to prop them up. Patient noted she is eating less, seems less hungry. Patient said she is sleeping well. Patient admits she has not had time to work on dream board but is planning to start that now that pain is more controlled. Patient said her son and two granddaughters are there and helping with errands.  3. PATIENT/CAREGIVER EDUCATION/ COPING:  Patient indicates positive coping skills, is able to discuss her feelings and concerns. SW to continue to provide emotional support to patient.  4. PERSONAL EMERGENCY PLAN:  Patient has a phone and will call 9-1-1 for emergencies. Due to COVID-19 crisis, patient verbalized that she is remaining at home and washing hands frequently. Does have a mask if needs to go out. 5. COMMUNITY RESOURCES COORDINATION/ HEALTH CARE NAVIGATION:  Patient completed home health therapy. No needs at this time.  6. FINANCIAL/LEGAL CONCERNS/INTERVENTIONS:  None.     SOCIAL HX:  Social History   Tobacco Use  . Smoking status:  Never Smoker  . Smokeless tobacco: Never Used  Substance Use Topics  . Alcohol use: No    CODE STATUS:   Code Status: Prior  ADVANCED DIRECTIVES: N MOST FORM COMPLETE:  No HOSPICE EDUCATION PROVIDED: None.  PPS: Patient is independent with most of her ADLs, ambulates with her walker.   Due to the COVID-19 crisis, this visit was done via Zoom from my office and it was initiated and consent by this patient. This was a scheduled visit.   I spent 45 minutes with patient/family, from 11:00-11:45a providing education, support and consultation.       Margaretmary Lombard, LCSW

## 2018-08-31 NOTE — Progress Notes (Signed)
PATIENT NAME: [name] DOB: 05/22/1940 MRN: [id number]  PRIMARY CARE PROVIDER: [name], MD  RESPONSIBLE PARTY:  Acct ID - Guarantor Home Phone Work Phone Relationship Acct Type  [id number] SYANN, [phone] Self P/F     [address], [name], [place] [address]    PLAN OF CARE and INTERVENTIONS:               1.  GOALS OF CARE/ ADVANCE CARE PLANNING:  Remain at home with family.               2.  PATIENT/CAREGIVER EDUCATION: Education on signs ad symptoms of infection, fever, chills, cough, increased weakness, shortness of breath                3.  DISEASE STATUS: Patient is a 78 year old patient with multiple medical issues.  Patient lives at home with her husband and son.  Patient's teenage grandchildren have been staying with patient recently.  Patient's granddaughter assisted patient with connecting to Zoom for visit.   Due to the COVID-19 crisis, this visit was done via telemedicine from my office and it was initiated and consent by this patient and or family. Patient reports she had a infection in back tooth on right side.  Patient had tooth extracted 08-20-18. Patient has bruising and swelling to right side of face.  Patient reports she is no longer taking pain meds for tooth pain but remains on antibiotic. Patient denies having pain at the present time. Patient reports she has not taken her vital signs this AM.  Patient reports her vital signs have "been stable."  Patient reports she continues to wear her O2 at night at 2 liters.  Patient denies having any shortness of breath. Patient reports her O2 sats have been 93% on room air.  Patient reports her weight is 270 pounds.  Patient states she has not been eating as much at meal times.  Patient reports she eats small amounts more frequently.  Patient  Reports she has rash on lower extremities and is going to apply hydrocortisone cream to rash.  Patient reports she has been keeping lower extremities  elevated.  Patient states she sleeps well for 5-6 hours every night.  Patient reports she has not left her home other than to go to the dentist.  Patient states she has not felt like working on memory board due to tooth infection.  Patient states she "will get back to it." when she feels better.  Patient denies having any concerns at the present time.  Patient encouraged to contact Palliative Care with questions or concerns.               HISTORY OF PRESENT ILLNESS:    CODE STATUS: DNR  ADVANCED DIRECTIVES: N  MOST FORM: No PPS: 50%   PHYSICAL EXAM:   VITALS:Not taken TELEMED visit.  Patient reports VS have been stable  LUNGS: Denies shortness of breath/ wears O2 at night at 2 liters  CARDIAC: Reports normal rate (per patient) EXTREMITIES: 1+ edema to LE's (pt. Reports she has been elevating lower extremities.)  SKIN: Purple brusing to right side of face/ red rash to lower extremities   NEURO: Alert and oriented x 4       Nilda Simmer, RN

## 2018-09-01 ENCOUNTER — Encounter: Payer: Self-pay | Admitting: Family Medicine

## 2018-09-01 ENCOUNTER — Ambulatory Visit (INDEPENDENT_AMBULATORY_CARE_PROVIDER_SITE_OTHER): Payer: Medicare Other | Admitting: Family Medicine

## 2018-09-01 ENCOUNTER — Ambulatory Visit: Payer: Self-pay

## 2018-09-01 ENCOUNTER — Other Ambulatory Visit: Payer: Self-pay

## 2018-09-01 DIAGNOSIS — R600 Localized edema: Secondary | ICD-10-CM

## 2018-09-01 DIAGNOSIS — L03116 Cellulitis of left lower limb: Secondary | ICD-10-CM | POA: Diagnosis not present

## 2018-09-01 DIAGNOSIS — R58 Hemorrhage, not elsewhere classified: Secondary | ICD-10-CM

## 2018-09-01 MED ORDER — DOXYCYCLINE HYCLATE 100 MG PO TABS: 100.0000 mg | ORAL_TABLET | Freq: Two times a day (BID) | ORAL | 0 refills | Status: DC

## 2018-09-01 NOTE — Progress Notes (Signed)
Virtual Visit via Video Note  I connected with [name] on 09/01/18 at  2:30 PM EDT by a video enabled telemedicine application and verified that I am speaking with the correct person using two identifiers.  Location patient: home Location provider:home office Persons participating in the virtual visit: patient, provider.  Pt's daughter nearby.  I discussed the limitations of evaluation and management by telemedicine and the availability of in person appointments. The patient expressed understanding and agreed to proceed.   HPI:  Pt is a 78 yo female with pmh sig for CHF, DM 2, neuropathy, HTN, h/o DVT and PE, ILD, COPD, hypothyroidism, meningioma of L shephenoid wing, RA, OA, CKD stage 3, HLD, gout, depression.  Pt notes itching, soreness, redness, and peeling of skin around L ankle.  Unsure if she bumped into something. Denies bites, scratches, or other wounds.  Does endorse increased edema that is improving some.  Tried calomine lotion, washing the ankle, and vaseline.  Pt has a scar on her L leg that has increased erythema and edema as well.    Pt notes she had a tooth surgically extracted on 4/3.  Pt was off her blood thinner (Eliquis) x 1 day then restarted it after the procedure.  Pt with bruising of R side of face which is improving.  Area is not painful, but "looks bad".  Pt is on Penicillin for her tooth.  Has 1-2 days left of pcn.  Pt has not followed up with her oral surgeon.  ROS: See pertinent positives and negatives per HPI.  Past Medical History:  Diagnosis Date  . Acute on chronic diastolic (congestive) heart failure ([place])   . Anemia    iron deficiency anemia - secondary to blood loss ( chronic)   . Arthritis    endstage changes bilateral knees/bilateral ankles.   . Asthma   . Carotid artery occlusion   . Chronic fatigue   . Chronic kidney disease   . Closed left hip fracture ([place])   . Clotting disorder ([place])    pt denies this  . Contusion of left knee    due  to fall 1/14.  [name] COPD (chronic obstructive pulmonary disease) (H[facility])    pulmonary fibrosis  . Depression, reactive   . Diabetes mellitus    type II   . Diastolic dysfunction   . Difficulty in walking   . Family history of heart disease   . Generalized muscle weakness   . Gout   . High cholesterol   . History of falling   . Hypertension   . Hypothyroidism   . Interstitial lung disease ([place])   . 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    . Morbid obesity ([place])   . Morbid obesity ([place])   . Neuromuscular disorder ([place])    diabetic neuropathy   . Normal coronary arteries    cardiac catheterization performed  10/31/14  . RA (rheumatoid arthritis) ([place])    has been off methotreaxte since 10/13.  [name] Rheumatoid arthritis ([place])   . Shortness of breath   . Spinal stenosis of lumbar region   . Thyroid disease   . Unspecified lack of coordination   . URI (upper respiratory infection)     Past Surgical History:  Procedure Laterality Date  . ABDOMINAL HYSTERECTOMY    . BRAIN SURGERY     Gamma knife 10/13. Needs repeat spring  '14  . CARDIAC CATHETERIZATION  N/A 10/31/2014   Procedure: Right/Left Heart Cath and Coronary Angiography;  Surgeon: [name], MD;  Location: [facility];  Service: Cardiovascular;  Laterality: N/A;  . ESOPHAGOGASTRODUODENOSCOPY (EGD) WITH PROPOFOL N/A 09/14/2014   Procedure: ESOPHAGOGASTRODUODENOSCOPY (EGD) WITH PROPOFOL;  Surgeon: [name], MD;  Location: [facility];  Service: Endoscopy;  Laterality: N/A;  . INCISION AND DRAINAGE HIP Left 01/16/2017   Procedure: IRRIGATION AND DEBRIDEMENT LEFT HIP;  Surgeon: [name], MD;  Location: [facility];  Service: Orthopedics;  Laterality: Left;  . INTRAMEDULLARY (IM) NAIL INTERTROCHANTERIC Left 11/29/2016   Procedure: INTRAMEDULLARY (IM) NAIL INTERTROCHANTRIC;  Surgeon: [name], MD;   Location: [facility];  Service: Orthopedics;  Laterality: Left;  . OVARY SURGERY    . SHOULDER SURGERY Left   . TONSILLECTOMY  age 51  . VIDEO BRONCHOSCOPY Bilateral 05/31/2013   Procedure: VIDEO BRONCHOSCOPY WITHOUT FLUORO;  Surgeon: Brand Males, MD;  Location: [facility];  Service: Cardiopulmonary;  Laterality: Bilateral;  . video bronscoscopy  2000   lung    Family History  Problem Relation Age of Onset  . Diabetes Mother   . Heart attack Mother   . Hypertension Father   . Lung cancer Father   . Diabetes Sister   . Diabetes Brother   . Hypertension Brother   . Heart disease Brother   . Heart attack Brother   . Kidney cancer Brother   . Uterine cancer Daughter   . Breast cancer Sister   . Rheum arthritis Maternal Uncle   . Gout Brother   . Kidney failure Brother        x 5    SOCIAL HX:    Current Outpatient Medications:  .  acetaminophen (TYLENOL) 500 MG tablet, Take 1,000 mg by mouth every 6 (six) hours as needed for moderate pain. , Disp: , Rfl:  .  albuterol (PROVENTIL HFA;VENTOLIN HFA) 108 (90 BASE) MCG/ACT inhaler, Inhale 2 puffs into the lungs every 6 (six) hours as needed for wheezing or shortness of breath., Disp: , Rfl:  .  allopurinol (ZYLOPRIM) 300 MG tablet, Take 300 mg by mouth daily., Disp: , Rfl:  .  apixaban (ELIQUIS) 5 MG TABS tablet, Take 1 tablet (5 mg total) by mouth 2 (two) times daily., Disp: 20 tablet, Rfl: 0 .  atorvastatin (LIPITOR) 20 MG tablet, Take 10 mg by mouth every evening. , Disp: , Rfl:  .  azithromycin (ZITHROMAX) 500 MG tablet, Take 1 tablet by mouth daily. TAKE ON MON, WED & FRI, Disp: , Rfl: 0 .  Calcium Carb-Cholecalciferol (CALCIUM + D3 PO), Take 2 tablets by mouth daily with lunch. , Disp: , Rfl:  .  cholecalciferol (VITAMIN D) 1000 UNITS tablet, Take 2,000 Units by mouth daily with lunch. , Disp: , Rfl:  .  colchicine 0.6 MG tablet, Take 0.6 mg by mouth daily., Disp: , Rfl:  .  cyclobenzaprine (FLEXERIL) 5 MG tablet, Take 1  tablet (5 mg total) by mouth at bedtime as needed for muscle spasms., Disp: 90 tablet, Rfl: 3 .  diclofenac sodium (VOLTAREN) 1 % GEL, Apply 1 application topically 3 (three) times daily. Both hands and knees, Disp: 3 Tube, Rfl: 4 .  docusate sodium (COLACE) 100 MG capsule, Take 1 capsule (100 mg total) by mouth 2 (two) times daily., Disp: 10 capsule, Rfl: 0 .  DULoxetine (CYMBALTA) 60 MG capsule, Take 1 capsule (60 mg total) by mouth daily., Disp: 90 capsule, Rfl: 3 .  fluticasone (FLONASE) 50  MCG/ACT nasal spray, Place 2 sprays into both nostrils daily., Disp: 16 g, Rfl: 3 .  furosemide (LASIX) 20 MG tablet, Take 3 tablets (60 mg total) by mouth daily., Disp: 60 tablet, Rfl: 1 .  gabapentin (NEURONTIN) 600 MG tablet, Take two in the morning , Two mid- afternoon and two at bedtime, Disp: 540 tablet, Rfl: 3 .  guaiFENesin (MUCINEX) 600 MG 12 hr tablet, Take 1,200 mg by mouth 2 (two) times daily., Disp: , Rfl:  .  hydroxychloroquine (PLAQUENIL) 200 MG tablet, Take 200 mg by mouth daily., Disp: , Rfl:  .  ipratropium-albuterol (DUONEB) 0.5-2.5 (3) MG/3ML SOLN, Take 3 mLs by nebulization every 6 (six) hours as needed (cough/wheezing). , Disp: , Rfl:  .  levothyroxine (SYNTHROID, LEVOTHROID) 50 MCG tablet, Take 1 tablet (50 mcg total) by mouth daily., Disp: 90 tablet, Rfl: 1 .  linaclotide (LINZESS) 72 MCG capsule, Take 1 capsule (72 mcg total) by mouth daily before breakfast., Disp: 90 capsule, Rfl: 1 .  metoprolol succinate (TOPROL XL) 100 MG 24 hr tablet, Take 1/2 tablet by mouth daily with or immediately following a meal., Disp: 45 tablet, Rfl: 1 .  morphine (MS CONTIN) 15 MG 12 hr tablet, Take 1 tablet (15 mg total) by mouth every 12 (twelve) hours., Disp: 60 tablet, Rfl: 0 .  morphine (MSIR) 15 MG tablet, Take 1 tablet (15 mg total) by mouth 2 (two) times daily as needed for moderate pain ([facility])., Disp: 60 tablet, Rfl: 0 .  Multiple Vitamin (MULTIVITAMIN WITH MINERALS) TABS, Take 1  tablet by mouth daily. , Disp: , Rfl:  .  olmesartan (BENICAR) 20 MG tablet, Take 1 tablet (20 mg total) by mouth daily., Disp: 90 tablet, Rfl: 0 .  OXYGEN-HELIUM IN, Inhale 2 L into the lungs at bedtime. And on exertion, Disp: , Rfl:  .  pantoprazole (PROTONIX) 40 MG tablet, take 1 tablet by mouth once daily MINUTES BEFORE 1ST MEAL OF THE DAY, Disp: 90 tablet, Rfl: 1 .  potassium chloride (K-DUR,KLOR-CON) 10 MEQ tablet, Take 10 mEq by mouth daily. , Disp: , Rfl:  .  predniSONE (DELTASONE) 5 MG tablet, Take 12.5 mg by mouth every morning. , Disp: , Rfl: 0 .  Semaglutide,0.25 or 0.5MG/DOS, (OZEMPIC, 0.25 OR 0.5 MG/DOSE,) 2 MG/1.5ML SOPN, Inject 0.5 mg into the skin once a week., Disp: 3 pen, Rfl: 1 .  sodium chloride (OCEAN) 0.65 % SOLN nasal spray, Place 1 spray into both nostrils as needed for congestion., Disp: , Rfl:  .  Spacer/Aero-Holding Chambers (AEROCHAMBER MV) inhaler, Use as instructed, Disp: 1 each, Rfl: 0  EXAM:  VITALS per patient if applicable:  RR between 12-20 bpm  GENERAL: alert, oriented, appears well and in no acute distress  HEENT: atraumatic, conjunctiva clear, no obvious abnormalities on inspection of external nose and ears  NECK: normal movements of the head and neck  LUNGS: on inspection no signs of respiratory distress, breathing rate appears normal, no obvious gross SOB, gasping or wheezing  CV: no obvious cyanosis  MS: moves all visible extremities without noticeable abnormality  SKIN:  Dry appearing skin with desquamination around L ankle.  L lateral shin with well healed surgical incision with increased erythema and edema surrounding area.              PSYCH/NEURO: pleasant and cooperative, no obvious depression or anxiety, speech and thought processing grossly intact  ASSESSMENT AND PLAN:  Discussed the following assessment and plan:  Cellulitis of left lower extremity  -  Plan: doxycycline (VIBRA-TABS) 100 MG tablet -given precautions.  Pt  advised to monitor for worsening symptoms including fever, chills, n/v, spreading erythema, etc. -consider eating yogurt or taking a probiotic given continued abx use. -f/u next wk, sooner if needed.  Leg edema -discussed elevating LEs when sitting. -ok to wrap RLE to reduce edema. -daily weight suggested given h/o CHF. -continue current meds  Ecchymosis -discussed using ice, massage, etc -pt advised to contact oral surgeon in regards to f/u. -given precautions -ok to continue eliquis  I discussed the assessment and treatment plan with the patient. The patient was provided an opportunity to ask questions and all were answered. The patient agreed with the plan and demonstrated an understanding of the instructions.   The patient was advised to call back or seek an in-person evaluation if the symptoms worsen or if the condition fails to improve as anticipated.   [name], MD

## 2018-09-01 NOTE — Telephone Encounter (Signed)
Incoming call from Patient with a complaint of swollen right ankle.  And bruising  on face right side of cheek.  Onset of swelling of  Ankle was about 2 days ago.  Reports a rash and peeling on the ankle also with a little bleeding.  Patient denies pain in ankle.  Patient states that it is about 2 inches out.  Keeps it elevated.  Patient feels its related to her arthritis.  Also complains of right side of cheek swelling.  Recently had oral  Surgery.  Face is still bruising. Patient reports that she was on a blood thinner prior to oral surgery.  Reviewed protocol with Patient.  Recommended Patient be evaluated by PCP within 3 days.  Transferred Patient to [facility] office.       3  [name] Female, 78 y.o., January 08, 1941 MRN:  [id number] Phone:  [phone] [name]) PCP:  Joan Ruddy, MD Primary Cvg:  MEDICARE/MEDICARE PART A AND B Next Appt With Physical Medicine and Rehabilitation 09/21/2018 at 11:20 AM Message from [name] sent at 09/01/2018 12:06 PM EDT   Summary: swollen ankle, bruising on face after tooth extraction    Patient's daughter (not with patient at the time of call) calling to report that patients ankle is swollen, red and itchy. She also wanted to mention that her mother had a tooth extraction almost 2 weeks ago that does not seem to be healing properly. She states the patients face is still "black and blue". She states the patient can be reached directly for triage.         Call History    Type Contact Phone  09/01/2018 12:04 PM Phone (Incoming) [name] (Emergency Contact) [phone]  User: [name]    Reason for Disposition . [1] Mild facial swelling (puffiness) AND [2] persists > 3 days . [1] MODERATE pain (e.g., interferes with normal activities, limping) AND [2] present > 3 days  Answer Assessment - Initial Assessment Questions 1. ONSET: "When did the swelling start?" (e.g., minutes, hours, days)    08/20/18 2. LOCATION: "What  part of the face is swollen?"     Right side of face 3. SEVERITY: "How swollen is it?"     *No Answer* 4. ITCHING: "Is there any itching?" If so, ask: "How much?"   (Scale 1-10; mild, moderate or severe)     Denies  Just bruised 5. PAIN: "Is the swelling painful to touch?" If so, ask: "How painful is it?"   (Scale 1-10; mild, moderate or severe)     denies 6. FEVER: "Do you have a fever?" If so, ask: "What is it, how was it measured, and when did it start?"      denies 7. CAUSE: "What do you think is causing the face swelling?"     Tooth removal  8. RECURRENT SYMPTOM: "Have you had face swelling before?" If so, ask: "When was the last time?" "What happened that time?"     *No Answer* 9. OTHER SYMPTOMS: "Do you have any other symptoms?" (e.g., toothache, leg swelling)      10. PREGNANCY: "Is there any chance you are pregnant?" "When was your last menstrual period?"       *No Answer*  Answer Assessment - Initial Assessment Questions 1. LOCATION: "Which joint is swollen?"     Left ankle 2. ONSET: "When did the swelling start?"     2 to days ago 3. SIZE: "How large is the swelling?"     About 2 inches  out 4. PAIN: "Is there any pain?" If so, ask: "How bad is it?" (Scale 1-10; or mild, moderate, severe)     Denies  Pain , itching and sore.   5. CAUSE: "What do you think caused the swollen joint?"     artheritis 6. OTHER SYMPTOMS: "Do you have any other symptoms?" (e.g., fever, chest pain, difficulty breathing, calf pain)     Denies rash , peeling.   7. PREGNANCY: "Is there any chance you are pregnant?" "When was your last menstrual period?"  Protocols used: [facility], [facility]

## 2018-09-01 NOTE — Telephone Encounter (Signed)
Please advise 

## 2018-09-01 NOTE — Telephone Encounter (Signed)
Pt has been scheduled for web ex visit with dr Volanda Napoleon today at 2.30 pm

## 2018-09-18 ENCOUNTER — Other Ambulatory Visit: Payer: Self-pay | Admitting: Family Medicine

## 2018-09-21 ENCOUNTER — Ambulatory Visit: Payer: Medicare Other | Admitting: Physical Medicine & Rehabilitation

## 2018-09-22 ENCOUNTER — Encounter: Payer: Self-pay | Admitting: Family Medicine

## 2018-09-23 ENCOUNTER — Other Ambulatory Visit: Payer: Self-pay

## 2018-09-23 ENCOUNTER — Ambulatory Visit (HOSPITAL_COMMUNITY)
Admission: EM | Admit: 2018-09-23 | Discharge: 2018-09-23 | Disposition: A | Discharge status: Home / Self Care | Payer: Medicare Other | Attending: Family Medicine | Admitting: Family Medicine

## 2018-09-23 ENCOUNTER — Ambulatory Visit: Payer: Medicare Other | Admitting: Family Medicine

## 2018-09-23 DIAGNOSIS — L03116 Cellulitis of left lower limb: Secondary | ICD-10-CM

## 2018-09-23 DIAGNOSIS — R609 Edema, unspecified: Secondary | ICD-10-CM

## 2018-09-23 MED ORDER — SULFAMETHOXAZOLE-TRIMETHOPRIM 800-160 MG PO TABS: 1.0000 | ORAL_TABLET | Freq: Two times a day (BID) | ORAL | 0 refills | Status: AC

## 2018-09-23 NOTE — ED Triage Notes (Signed)
Pt has h/o cellulitis in left leg and completes doxycycline 10 days ago, left lower leg has began weeping

## 2018-09-23 NOTE — Discharge Instructions (Addendum)
Elevate legs above the level of your heart, as much as able Maintain low-salt diet Continue all your current medications Add Septra DS (sulfa antibiotic) 1 pill 2 times a day.  Take 2 doses today (now and tonight) Follow-up with your primary care doctor

## 2018-09-23 NOTE — Telephone Encounter (Signed)
Spoke with Army Chaco, CMA.  Matter addressed.  Pt to be seen.

## 2018-09-23 NOTE — ED Provider Notes (Signed)
[place]    CSN: [id number] Arrival date & time: 09/23/18  1103     History   Chief Complaint Chief Complaint  Patient presents with   Leg Swelling    HPI [name] is a 78 y.o. female.   HPI  This is a very complicated 78 year old female with multiple medical problems, polypharmacy, and chronic pain.  She has a longstanding battle with severe bilateral pedal edema.  She also has kidney impairment.  She is on Lasix 60 mg a day.  She states that 80 a day will work better, however, it impairs her kidney function and her doctors always reduce it back down.  She states that she elevates her feet but does so by sitting in a recliner.  I explained her that she has to have her legs above the level of her heart.  Perhaps she could put a pillow or wedge under her feet in the recliner.  We discussed that she needs not eat salt.  She states she "tries" not to eat salt.  She is chronic congestive heart failure and venous insufficiency. She has had longstanding problems with a wound on the left side of her left leg.  The initial wound took months to heal with a lot of wound care and 2 or 3 courses of antibiotics.  She has no known allergies to antibiotics.  She states this area broke back out and was red and painful.  Her doctor treated her with a course of doxycycline.  She states it never got better, and that after the doxycycline it feels like it is getting worse to her.  The swollen discolored skin is warm and painful.  It is also broken out in blisters.  She sent pictures to her primary care doctor who recommended that she obtain medical care.  Of her prior cultures was positive for MRSA She does not have any chest pain shortness of breath or symptoms suggestive of worsening congestive heart failure. She is compliant with her medications. She did have a DVT in this leg in January 2020.  She is anticoagulated.  She is tolerating the anticoagulation well although mentions a  sub-conjunctival hematoma present today.  Past Medical History:  Diagnosis Date   Acute on chronic diastolic (congestive) heart failure (HCC)    Anemia    iron deficiency anemia - secondary to blood loss ( chronic)    Arthritis    endstage changes bilateral knees/bilateral ankles.    Asthma    Carotid artery occlusion    Chronic fatigue    Chronic kidney disease    Closed left hip fracture (HCC)    Clotting disorder ([place])    pt denies this   Contusion of left knee    due to fall 1/14.   COPD (chronic obstructive pulmonary disease) (HCC)    pulmonary fibrosis   Depression, reactive    Diabetes mellitus    type II    Diastolic dysfunction    Difficulty in walking    Family history of heart disease    Generalized muscle weakness    Gout    High cholesterol    History of falling    Hypertension    Hypothyroidism    Interstitial lung disease ([place])    Meningioma of left sphenoid wing involving cavernous sinus ([place]) 02/17/2012   Continue diplopia, left eye pain and left headaches.     Morbid obesity ([place])    Morbid obesity ([place])    Neuromuscular disorder ([place])  diabetic neuropathy    Normal coronary arteries    cardiac catheterization performed  10/31/14   RA (rheumatoid arthritis) ([place])    has been off methotreaxte since 10/13.   Rheumatoid arthritis ([place])    Shortness of breath    Spinal stenosis of lumbar region    Thyroid disease    Unspecified lack of coordination    URI (upper respiratory infection)     Patient Active Problem List   Diagnosis Date Noted   Anticoagulated 06/25/2018   Chronic pain 06/25/2018   CRI (chronic renal insufficiency), stage 3 (moderate) (HCC) 06/25/2018   Drug-induced constipation 06/18/2018   DVT (deep venous thrombosis) ([place]) 06/03/2018   Syncope and collapse 06/02/2018   Primary osteoarthritis of both knees 05/26/2018   Body mass index (BMI) of 50-59.9 in adult ([place]) 05/26/2018    Acquired hypothyroidism 05/26/2018   Chronic bilateral low back pain without sciatica 02/17/2018   Pain in left hip 02/18/2017   Wound infection after surgery, initial encounter 01/16/2017   Closed nondisplaced intertrochanteric fracture of left femur with delayed healing 01/14/2017   Postoperative wound infection 01/14/2017   Gout 11/29/2016   Depression 11/29/2016   Closed left hip fracture ([place]) 11/29/2016   GERD (gastroesophageal reflux disease) 11/29/2016   Closed intertrochanteric fracture of hip, left, initial encounter ([place]) 11/29/2016   Physical deconditioning 07/31/2015   Pulmonary fibrosis, postinflammatory ([place]) 07/31/2015   Tendinitis of left rotator cuff 06/13/2015   Acute on chronic diastolic heart failure ([place]) 04/21/2015   Acute kidney injury superimposed on chronic kidney disease ([place]) 04/17/2015   Hypotension 04/17/2015   Chronic respiratory failure with hypoxia ([place]) 02/09/2015   Dyspnea and respiratory abnormality 01/03/2015   Normal coronary arteries 11/02/2014   Morbid obesity ([place]) 10/30/2014   Dysphagia 08/21/2014   Chronic fatigue 06/21/2014   DOE (dyspnea on exertion) 05/24/2014   Primary osteoarthritis of left knee 04/26/2014   High risk medication use 04/17/2014   Aphasia 02/12/2014   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 ([place]) 01/18/2014   Lumbar facet arthropathy 01/18/2014   Bilateral edema of lower extremity 05/30/2013   Chronic cough 05/24/2013   ILD (interstitial lung disease) ([place]) 04/10/2013   Greater trochanteric bursitis of left hip 08/13/2012   Spinal stenosis of lumbar region with radiculopathy 07/12/2012   Inability to walk 07/12/2012   Asymptomatic bacteriuria 07/12/2012   Meningioma of left sphenoid wing involving cavernous sinus ([place]) 02/17/2012   Chest pain with moderate risk of acute coronary syndrome 01/28/2012   Rheumatoid arthritis ([place]) 01/28/2012   Primary osteoarthritis of  right knee 01/28/2012   Dyslipidemia    Thyroid disease    Essential hypertension     Past Surgical History:  Procedure Laterality Date   ABDOMINAL HYSTERECTOMY     BRAIN SURGERY     Gamma knife 10/13. Needs repeat spring  '14   CARDIAC CATHETERIZATION N/A 10/31/2014   Procedure: Right/Left Heart Cath and Coronary Angiography;  Surgeon: [name], MD;  Location: [facility];  Service: Cardiovascular;  Laterality: N/A;   ESOPHAGOGASTRODUODENOSCOPY (EGD) WITH PROPOFOL N/A 09/14/2014   Procedure: ESOPHAGOGASTRODUODENOSCOPY (EGD) WITH PROPOFOL;  Surgeon: [name], MD;  Location: [facility];  Service: Endoscopy;  Laterality: N/A;   INCISION AND DRAINAGE HIP Left 01/16/2017   Procedure: IRRIGATION AND DEBRIDEMENT LEFT HIP;  Surgeon: [name], MD;  Location: [facility];  Service: Orthopedics;  Laterality: Left;   INTRAMEDULLARY (IM) NAIL INTERTROCHANTERIC Left 11/29/2016   Procedure: INTRAMEDULLARY (IM) NAIL INTERTROCHANTRIC;  Surgeon: [name], MD;  Location: [facility];  Service: Orthopedics;  Laterality: Left;   OVARY SURGERY     SHOULDER SURGERY Left    TONSILLECTOMY  age 72   VIDEO BRONCHOSCOPY Bilateral 05/31/2013   Procedure: VIDEO BRONCHOSCOPY WITHOUT FLUORO;  Surgeon: Brand Males, MD;  Location: [facility];  Service: Cardiopulmonary;  Laterality: Bilateral;   video bronscoscopy  2000   lung    OB History   No obstetric history on file.      Home Medications    Prior to Admission medications   Medication Sig Start Date End Date Taking? Authorizing Provider  azithromycin (ZITHROMAX) 500 MG tablet Take 1 tablet by mouth daily. TAKE ON MON, WED & FRI 09/20/15  Yes [provider]  doxycycline (VIBRA-TABS) 100 MG tablet Take 1 tablet (100 mg total) by mouth 2 (two) times daily. 09/01/18  Yes [name], MD  acetaminophen (TYLENOL) 500 MG tablet Take 1,000 mg by mouth every 6 (six) hours as needed for  moderate pain.     [provider]  albuterol (PROVENTIL HFA;VENTOLIN HFA) 108 (90 BASE) MCG/ACT inhaler Inhale 2 puffs into the lungs every 6 (six) hours as needed for wheezing or shortness of breath.    [provider]  allopurinol (ZYLOPRIM) 300 MG tablet Take 300 mg by mouth daily.    [provider]  apixaban (ELIQUIS) 5 MG TABS tablet Take 1 tablet (5 mg total) by mouth 2 (two) times daily. 08/10/18   [name], MD  atorvastatin (LIPITOR) 20 MG tablet Take 10 mg by mouth every evening.     [provider]  Calcium Carb-Cholecalciferol (CALCIUM + D3 PO) Take 2 tablets by mouth daily with lunch.     [provider]  cholecalciferol (VITAMIN D) 1000 UNITS tablet Take 2,000 Units by mouth daily with lunch.     [provider]  colchicine 0.6 MG tablet Take 0.6 mg by mouth daily. 06/25/16   [provider]  cyclobenzaprine (FLEXERIL) 5 MG tablet Take 1 tablet (5 mg total) by mouth at bedtime as needed for muscle spasms. 08/26/17   [name], NP  diclofenac sodium (VOLTAREN) 1 % GEL Apply 1 application topically 3 (three) times daily. Both hands and knees 08/26/17   [name], NP  docusate sodium (COLACE) 100 MG capsule Take 1 capsule (100 mg total) by mouth 2 (two) times daily. 12/02/16   Oswald Hillock, MD  DULoxetine (CYMBALTA) 60 MG capsule Take 1 capsule (60 mg total) by mouth daily. 08/26/17   [name], NP  fluticasone (FLONASE) 50 MCG/ACT nasal spray Place 2 sprays into both nostrils daily. 05/27/18   Minette Brine, FNP  furosemide (LASIX) 20 MG tablet TAKE 3 TABLETS BY MOUTH ONCE DAILY 09/20/18   [name], MD  gabapentin (NEURONTIN) 600 MG tablet Take two in the morning , Two mid- afternoon and two at bedtime 06/03/17   [name], NP  guaiFENesin (MUCINEX) 600 MG 12 hr tablet Take 1,200 mg by mouth 2 (two) times daily.    [provider]  hydroxychloroquine (PLAQUENIL) 200 MG tablet Take 200  mg by mouth daily.    [provider]  ipratropium-albuterol (DUONEB) 0.5-2.5 (3) MG/3ML SOLN Take 3 mLs by nebulization every 6 (six) hours as needed (cough/wheezing).     [provider]  levothyroxine (SYNTHROID, LEVOTHROID) 50 MCG tablet Take 1 tablet (50 mcg total) by mouth daily. 05/27/18   Minette Brine,  FNP  linaclotide (LINZESS) 72 MCG capsule Take 1 capsule (72 mcg total) by mouth daily before breakfast. 06/15/18   Minette Brine, FNP  metoprolol succinate (TOPROL XL) 100 MG 24 hr tablet Take 1/2 tablet by mouth daily with or immediately following a meal. 05/27/18   Minette Brine, FNP  morphine (MS CONTIN) 15 MG 12 hr tablet Take 1 tablet (15 mg total) by mouth every 12 (twelve) hours. 08/10/18   Meredith Staggers, MD  morphine (MSIR) 15 MG tablet Take 1 tablet (15 mg total) by mouth 2 (two) times daily as needed for moderate pain ([facility]). 08/10/18   Meredith Staggers, MD  Multiple Vitamin (MULTIVITAMIN WITH MINERALS) TABS Take 1 tablet by mouth daily.     [provider]  olmesartan (BENICAR) 20 MG tablet Take 1 tablet (20 mg total) by mouth daily. 07/28/18   [name], MD  OXYGEN-HELIUM IN Inhale 2 L into the lungs at bedtime. And on exertion    [provider]  pantoprazole (PROTONIX) 40 MG tablet take 1 tablet by mouth once daily MINUTES BEFORE 1ST MEAL OF THE DAY 05/27/18   Minette Brine, FNP  potassium chloride (K-DUR,KLOR-CON) 10 MEQ tablet Take 10 mEq by mouth daily.     [provider]  predniSONE (DELTASONE) 5 MG tablet Take 12.5 mg by mouth every morning.  06/20/15   [provider]  Semaglutide,0.25 or 0.5MG/DOS, (OZEMPIC, 0.25 OR 0.5 MG/DOSE,) 2 MG/1.5ML SOPN Inject 0.5 mg into the skin once a week. 05/27/18   Minette Brine, FNP  sodium chloride (OCEAN) 0.65 % SOLN nasal spray Place 1 spray into both nostrils as needed for congestion.    [provider]  Spacer/Aero-Holding Chambers (AEROCHAMBER MV) inhaler Use  as instructed 06/10/13   Parrett, Fonnie Mu, NP  sulfamethoxazole-trimethoprim (BACTRIM DS) 800-160 MG tablet Take 1 tablet by mouth 2 (two) times daily for 10 days. 09/23/18 10/03/18  Raylene Everts, MD    Family History Family History  Problem Relation Age of Onset   Diabetes Mother    Heart attack Mother    Hypertension Father    Lung cancer Father    Diabetes Sister    Diabetes Brother    Hypertension Brother    Heart disease Brother    Heart attack Brother    Kidney cancer Brother    Uterine cancer Daughter    Breast cancer Sister    Rheum arthritis Maternal Uncle    Gout Brother    Kidney failure Brother        x 5    Social History Social History   Tobacco Use   Smoking status: Never Smoker   Smokeless tobacco: Never Used  Substance Use Topics   Alcohol use: No   Drug use: No     Allergies   Codeine; Infliximab; and Lisinopril   Review of Systems Review of Systems  Constitutional: Positive for fever. Negative for chills.       Chronic  HENT: Negative for ear pain and sore throat.   Eyes: Negative for pain and visual disturbance.  Respiratory: Positive for shortness of breath. Negative for cough.        With exertion, chronic  Cardiovascular: Positive for leg swelling. Negative for chest pain and palpitations.       Also chronic  Gastrointestinal: Negative for abdominal pain and vomiting.  Genitourinary: Negative for dysuria, frequency and hematuria.  Musculoskeletal: Positive for arthralgias, back pain, gait problem and joint swelling.  Longstanding  Skin: Positive for color change and wound. Negative for rash.  Neurological: Negative for seizures, syncope and headaches.  Psychiatric/Behavioral: Positive for sleep disturbance. The patient is not nervous/anxious.   All other systems reviewed and are negative.    Physical Exam Triage Vital Signs ED Triage Vitals  Enc Vitals Group     BP 09/23/18 1118 130/87     Pulse Rate  09/23/18 1118 94     Resp 09/23/18 1118 20     Temp 09/23/18 1118 98 F (36.7 C)     Temp Source 09/23/18 1118 Oral     SpO2 09/23/18 1118 94 %     Weight --      Height --      Head Circumference --      Peak Flow --      Pain Score 09/23/18 1113 6     Pain Loc --      Pain Edu? --      Excl. in [place]? --    No data found.  Updated Vital Signs BP 130/87    Pulse 94    Temp 98 F (36.7 C) (Oral)    Resp 20    SpO2 94%      Physical Exam Constitutional:      General: She is not in acute distress.    Appearance: She is well-developed. She is obese. She is not ill-appearing.  HENT:     Head: Normocephalic and atraumatic.     Mouth/Throat:     Mouth: Mucous membranes are moist.  Eyes:     Conjunctiva/sclera: Conjunctivae normal.     Pupils: Pupils are equal, round, and reactive to light.  Neck:     Musculoskeletal: Normal range of motion.  Cardiovascular:     Rate and Rhythm: Normal rate and regular rhythm.     Comments: Heart sounds faint Pulmonary:     Effort: Pulmonary effort is normal. No respiratory distress.     Breath sounds: Rales present.     Comments: Crackles both bases Abdominal:     General: There is no distension.     Palpations: Abdomen is soft.     Comments: Protuberant  Musculoskeletal: Normal range of motion.     Right lower leg: Edema present.     Left lower leg: Edema present.     Comments: Very large edema both ankles, left slightly larger  Skin:    General: Skin is warm and dry.     Comments: Both ankles have massive edema.  Both feet with peeling and what looks like chronic tinea changes.  Lateral left leg has a deeply discolored healed wound with scar in the middle.  There are 2 or 3 small blisters in this area.  The surrounding skin is erythematous and warm.  Tender to touch.  Cap refill is good.  Neurological:     General: No focal deficit present.     Mental Status: She is alert.  Psychiatric:        Mood and Affect: Mood normal.         Behavior: Behavior normal.      UC Treatments / Results  Labs (all labs ordered are listed, but only abnormal results are displayed) Labs Reviewed - No data to display  EKG None  Radiology No results found.  Procedures Procedures (including critical care time)  Medications Ordered in UC Medications - No data to display  Initial Impression / Assessment and Plan / UC Course  I  have reviewed the triage vital signs and the nursing notes.  Pertinent labs & imaging results that were available during my care of the patient were reviewed by me and considered in my medical decision making (see chart for details).     Difficult to tell her stasis edema chronic changes from new cellulitis.  The patient has had cellulitis before and feels like she is developing an infection.  I am going to cover her with antibiotics in excess of caution.  She states she has taken sulfa antibiotics and tolerated them.  I think with her history of MRSA, and possible failure to improve on doxycycline this is a good choice.  She will follow-up with her PCP. Final Clinical Impressions(s) / UC Diagnoses   Final diagnoses:  Peripheral edema  Cellulitis of left lower extremity     Discharge Instructions     Elevate legs above the level of your heart, as much as able Maintain low-salt diet Continue all your current medications Add Septra DS (sulfa antibiotic) 1 pill 2 times a day.  Take 2 doses today (now and tonight) Follow-up with your primary care doctor   ED Prescriptions    Medication Sig Dispense Auth. Provider   sulfamethoxazole-trimethoprim (BACTRIM DS) 800-160 MG tablet Take 1 tablet by mouth 2 (two) times daily for 10 days. 20 tablet Raylene Everts, MD     Controlled Substance Prescriptions [place] Controlled Substance Registry consulted? Not Applicable   Raylene Everts, MD 09/23/18 1215

## 2018-10-01 ENCOUNTER — Telehealth: Payer: Self-pay

## 2018-10-01 NOTE — Telephone Encounter (Signed)
Telephone call to patient to schedule TELEHEALTH visit with patient. Patient in agreement with TELEHEALTH visit on 10-06-18 at 1:00 PM.

## 2018-10-06 ENCOUNTER — Other Ambulatory Visit: Payer: Medicare Other

## 2018-10-06 ENCOUNTER — Other Ambulatory Visit: Payer: Self-pay

## 2018-10-06 DIAGNOSIS — Z515 Encounter for palliative care: Secondary | ICD-10-CM

## 2018-10-06 NOTE — Progress Notes (Signed)
PATIENT NAME: CHEMEKA FILICE DOB: 22-Apr-1941 MRN: [id number]  PRIMARY CARE PROVIDER: [name], MD  RESPONSIBLE PARTY:  Acct ID - Guarantor Home Phone Work Phone Relationship Acct Type  [id number] MARJORIA, [phone] Self P/F     [address], [name], [place] [address]    PLAN OF CARE and INTERVENTIONS:               1.  GOALS OF CARE/ ADVANCE CARE PLANNING:  Remain at home with family.                2.  PATIENT/CAREGIVER EDUCATION:  Education on fall precautions, education on s/s of infection, fever, cough, shortness of breath, fatigue.               3.  DISEASE STATUS: Patient is a 78 year old patient who resides at home with her husband and other family members. Due to the COVID-19 crisis, this visit was done via telemedicine from my office and it was initiated and consent by this patient and or family. SW and RN met with patient for telehealth visit.  Patient alert and pleasant.  Patient reports she has cellulitis in left leg.  Patient states she has taken 2 (10 day) rounds of antibiotics for cellulitis.  Patient also complaining of increased pain in her legs.  Patient sufferes with chronic pain and reports she needs knee replacements.  Patient is not a candidate for surgery.  Patient reports she has telehealth visit with pain clinic next week.  Patient continues to take narcotics, gabapentin and muscle relaxants.  Patient also reports she has been alternating heat and ice for swelling.  Patient reports pain is worse with movement and she has been using wheelchair frequently.  Patient also requires assistance with getting lower extremities up in bed.  Patient reports she continues to have no appetite but makes herself eat small meals.  Patient denies having any nausea or vomiting.  RN made recommendation patient add yogurt to diet due to taking antibiotics.  Patient states she has been eating 1 yogurt daily.  Patient reports having shortness of breath.  Patient  continues to wear O2 at night and has been using during the day more frequently.  Patient reports she had a cough a few weeks ago but did fast track and cough has subsided.  Patient reports not sleeping well at night due to pain.  Patient reluctant to add sleep medication to current meds.  RN made recommendation for patient to try Melatonin at bedtime.  Patient states she is going to talk with pain MD about increased pain and increase in pain meds can be beneficial.  Patient's B/P was elevated at last MD appointment.  Patient to discuss if blood pressure med needs to be changed or increased.  Patient remains in agreement with palliative care services.  Patient encouraged to call with concerns.                    HISTORY OF PRESENT ILLNESS:    CODE STATUS: DNR  ADVANCED DIRECTIVES: N MOST FORM: No PPS: 40%   PHYSICAL EXAM:   VITALS:No vital signs taken Telehealth visit/ patient reports B/P elevated at Urgent Care.  Patient to follow up with PCP.   LUNGS: Shortness of breath on exertion/ cough improved CARDIAC:  EXTREMITIES: Cellulitis to left lower extremity/ 2+ edema SKIN: Left lower extremity/ cellulitis  NEURO: Alert and oriented x 4       Aaronjames Kelsay Clive,  RN 

## 2018-10-06 NOTE — Progress Notes (Signed)
COMMUNITY PALLIATIVE CARE SW NOTE  PATIENT NAME: [name] DOB: 12-22-1940 MRN: [id number]  PRIMARY CARE PROVIDER: [name], MD  RESPONSIBLE PARTY:  Acct ID - Guarantor Home Phone Work Phone Relationship Acct Type  [id number] [name], [name] Self P/F     [address], [name], [place] [address]     PLAN OF CARE and INTERVENTIONS:             1. GOALS OF CARE/ ADVANCE CARE PLANNING:  Goal is to remain in her home. Patient has DNR in home. 2. SOCIAL/EMOTIONAL/SPIRITUAL ASSESSMENT/ INTERVENTIONS:  SW and RN met with patient for TELEHEALTH visit. Patient said she is doing "better". Patient completed antibiotics for cellulitis in her legs. Patient said that her legs are still sore, and she continues having pain in her left leg and both knees. Patient reports pain is 8/10 today. Patient reports that the swelling is better and she has been using ice. Patient has morphine and tylenol for pain, patient also takes gabapentin for nerve pain. Patient told team that she is using her wheelchair most of the time now, because movement increases the pain. Patient reports that she does not have much of an appetite. Patient said her sleep has been impacted from the pain, unable to get comfortable. RN discussed sleep tips. Patient denies any stress or worries. Patient enjoys spending time with her family, reading and watching television. 3. PATIENT/CAREGIVER EDUCATION/ COPING:  Even with her pain, patient was positive and cheerful. Patient continues to have a positive, hopeful outlook. 4. PERSONAL EMERGENCY PLAN:  Patient/family will call 9-1-1 for emergencies. Due to COVID-19 crisis, patient verbalized that she is remaining at home, other than medical appointments.  5. COMMUNITY RESOURCES COORDINATION/ HEALTH CARE NAVIGATION:  Patient said she has a telehealth visit with Physical Medicine MD visit next week, plans to discuss her pain management concerns.  6. FINANCIAL/LEGAL  CONCERNS/INTERVENTIONS:  None.     SOCIAL HX:  Social History   Tobacco Use  . Smoking status: Never Smoker  . Smokeless tobacco: Never Used  Substance Use Topics  . Alcohol use: No    CODE STATUS:   Code Status: Prior  ADVANCED DIRECTIVES: N MOST FORM COMPLETE:  No HOSPICE EDUCATION PROVIDED: None.  PPS: Patient is independent with most of her ADLs, ambulates with her walker to the bathroom but said that she is using her wheelchair more often.   Due to the COVID-19 crisis, this visit was done viaZoomfrom my office and it was initiated and consent by this patient.This was a scheduled visit.  I spent32mnutes with patient/family, from 1:00-1:45aproviding education, support and consultation.    WMargaretmary Lombard LCSW

## 2018-10-12 ENCOUNTER — Encounter: Payer: Self-pay | Admitting: Physical Medicine & Rehabilitation

## 2018-10-12 ENCOUNTER — Telehealth: Payer: Self-pay | Admitting: Family Medicine

## 2018-10-12 ENCOUNTER — Other Ambulatory Visit: Payer: Self-pay

## 2018-10-12 ENCOUNTER — Encounter: Payer: Medicare Other | Attending: Physical Medicine & Rehabilitation | Admitting: Physical Medicine & Rehabilitation

## 2018-10-12 DIAGNOSIS — M7062 Trochanteric bursitis, left hip: Secondary | ICD-10-CM

## 2018-10-12 DIAGNOSIS — E1122 Type 2 diabetes mellitus with diabetic chronic kidney disease: Secondary | ICD-10-CM | POA: Insufficient documentation

## 2018-10-12 DIAGNOSIS — M1712 Unilateral primary osteoarthritis, left knee: Secondary | ICD-10-CM

## 2018-10-12 DIAGNOSIS — E1142 Type 2 diabetes mellitus with diabetic polyneuropathy: Secondary | ICD-10-CM | POA: Insufficient documentation

## 2018-10-12 DIAGNOSIS — M47816 Spondylosis without myelopathy or radiculopathy, lumbar region: Secondary | ICD-10-CM | POA: Diagnosis not present

## 2018-10-12 DIAGNOSIS — Z9889 Other specified postprocedural states: Secondary | ICD-10-CM | POA: Insufficient documentation

## 2018-10-12 DIAGNOSIS — M48062 Spinal stenosis, lumbar region with neurogenic claudication: Secondary | ICD-10-CM | POA: Insufficient documentation

## 2018-10-12 DIAGNOSIS — R5382 Chronic fatigue, unspecified: Secondary | ICD-10-CM | POA: Diagnosis not present

## 2018-10-12 DIAGNOSIS — F329 Major depressive disorder, single episode, unspecified: Secondary | ICD-10-CM | POA: Insufficient documentation

## 2018-10-12 DIAGNOSIS — M1711 Unilateral primary osteoarthritis, right knee: Secondary | ICD-10-CM

## 2018-10-12 DIAGNOSIS — M069 Rheumatoid arthritis, unspecified: Secondary | ICD-10-CM | POA: Insufficient documentation

## 2018-10-12 DIAGNOSIS — Z90710 Acquired absence of both cervix and uterus: Secondary | ICD-10-CM | POA: Insufficient documentation

## 2018-10-12 MED ORDER — MORPHINE SULFATE ER 15 MG PO TBCR: 15.0000 mg | EXTENDED_RELEASE_TABLET | Freq: Two times a day (BID) | ORAL | 0 refills | Status: DC

## 2018-10-12 MED ORDER — DULOXETINE HCL 60 MG PO CPEP: 60.0000 mg | ORAL_CAPSULE | Freq: Every day | ORAL | 3 refills | Status: DC

## 2018-10-12 MED ORDER — MORPHINE SULFATE 15 MG PO TABS: 15.0000 mg | ORAL_TABLET | Freq: Two times a day (BID) | ORAL | 0 refills | Status: DC | PRN

## 2018-10-12 MED ORDER — GABAPENTIN 600 MG PO TABS: ORAL_TABLET | ORAL | 3 refills | Status: DC

## 2018-10-12 NOTE — Progress Notes (Signed)
Subjective:    Patient ID: [name], female    DOB: 1940/10/21, 78 y.o.   MRN: [id number]  HPI   Due to national recommendations of social distancing because of COVID 69, an audio/video tele-health visit is felt to be the most appropriate encounter for this patient at this time. See MyChart message from today for the patient's consent to a tele-health encounter with [facility]. This is a follow up tele-visit via Webex. The patient is at home. MD is at office.    I am meeting with the patient today regarding her chronic pain.  We performed Zilretta injections to both knees over the last 2 months. She developed a cellulitis in her left leg for which she was in the ED on 5/7. That seems to have set he back a good bit. After resolution of the infection, her left knee seemed to be bothering her more. She really points back to January at the time of her left LE DVT and changes to her medication regimen as to when her knees began to hurt more.   For pain she remains on ms contin 62m q12 and ms ir [id number] prn. She also uses gabapentin and duloxeting.   Pain Inventory Average Pain 8 Pain Right Now 8 My pain is intermittent, sharp, stabbing and shooting  In the last 24 hours, has pain interfered with the following? General activity 8 Relation with others 8 Enjoyment of life 8 What TIME of day is your pain at its worst? morning Sleep (in general) Fair  Pain is worse with: unsure Pain improves with: heat/ice and medication Relief from Meds: 5  Mobility use a wheelchair  Function retired  Neuro/Psych bladder control problems weakness numbness tingling trouble walking  Prior Studies Any changes since last visit?  no  Physicians involved in your care Any changes since last visit?  no   Family History  Problem Relation Age of Onset  . Diabetes Mother   . Heart attack Mother   . Hypertension Father   . Lung cancer Father   .  Diabetes Sister   . Diabetes Brother   . Hypertension Brother   . Heart disease Brother   . Heart attack Brother   . Kidney cancer Brother   . Uterine cancer Daughter   . Breast cancer Sister   . Rheum arthritis Maternal Uncle   . Gout Brother   . Kidney failure Brother        x 5   Social History   Socioeconomic History  . Marital status: Married    Spouse name: Not on file  . Number of children: 6  . Years of education: college  . Highest education level: Not on file  Occupational History  . Occupation: retired [employer] broker. Financial resource strain: Not on file  . Food insecurity:    Worry: Not on file    Inability: Not on file  . Transportation needs:    Medical: Not on file    Non-medical: Not on file  Tobacco Use  . Smoking status: Never Smoker  . Smokeless tobacco: Never Used  Substance and Sexual Activity  . Alcohol use: No  . Drug use: No  . Sexual activity: Not Currently  Lifestyle  . Physical activity:    Days per week: Not on file    Minutes per session: Not on file  . Stress: Not on file  Relationships  . Social connections:  Talks on phone: Not on file    Gets together: Not on file    Attends religious service: Not on file    Active member of club or organization: Not on file    Attends meetings of clubs or organizations: Not on file    Relationship status: Not on file  Other Topics Concern  . Not on file  Social History Narrative   Patient consumes 2-3 cups coffee per day.   Past Surgical History:  Procedure Laterality Date  . ABDOMINAL HYSTERECTOMY    . BRAIN SURGERY     Gamma knife 10/13. Needs repeat spring  '14  . CARDIAC CATHETERIZATION N/A 10/31/2014   Procedure: Right/Left Heart Cath and Coronary Angiography;  Surgeon: [name], MD;  Location: [facility];  Service: Cardiovascular;  Laterality: N/A;  . ESOPHAGOGASTRODUODENOSCOPY (EGD) WITH PROPOFOL N/A 09/14/2014   Procedure: ESOPHAGOGASTRODUODENOSCOPY  (EGD) WITH PROPOFOL;  Surgeon: [name], MD;  Location: [facility];  Service: Endoscopy;  Laterality: N/A;  . INCISION AND DRAINAGE HIP Left 01/16/2017   Procedure: IRRIGATION AND DEBRIDEMENT LEFT HIP;  Surgeon: [name], MD;  Location: [facility];  Service: Orthopedics;  Laterality: Left;  . INTRAMEDULLARY (IM) NAIL INTERTROCHANTERIC Left 11/29/2016   Procedure: INTRAMEDULLARY (IM) NAIL INTERTROCHANTRIC;  Surgeon: [name], MD;  Location: [facility];  Service: Orthopedics;  Laterality: Left;  . OVARY SURGERY    . SHOULDER SURGERY Left   . TONSILLECTOMY  age 61  . VIDEO BRONCHOSCOPY Bilateral 05/31/2013   Procedure: VIDEO BRONCHOSCOPY WITHOUT FLUORO;  Surgeon: Brand Males, MD;  Location: [facility];  Service: Cardiopulmonary;  Laterality: Bilateral;  . video bronscoscopy  2000   lung   Past Medical History:  Diagnosis Date  . Acute on chronic diastolic (congestive) heart failure ([place])   . Anemia    iron deficiency anemia - secondary to blood loss ( chronic)   . Arthritis    endstage changes bilateral knees/bilateral ankles.   . Asthma   . Carotid artery occlusion   . Chronic fatigue   . Chronic kidney disease   . Closed left hip fracture ([place])   . Clotting disorder ([place])    pt denies this  . Contusion of left knee    due to fall 1/14.  [name] COPD (chronic obstructive pulmonary disease) (H[facility])    pulmonary fibrosis  . Depression, reactive   . Diabetes mellitus    type II   . Diastolic dysfunction   . Difficulty in walking   . Family history of heart disease   . Generalized muscle weakness   . Gout   . High cholesterol   . History of falling   . Hypertension   . Hypothyroidism   . Interstitial lung disease ([place])   . 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    . Morbid obesity ([place])   . Morbid obesity ([place])   . Neuromuscular disorder ([place])    diabetic  neuropathy   . Normal coronary arteries    cardiac catheterization performed  10/31/14  . RA (rheumatoid arthritis) ([place])    has been off methotreaxte since 10/13.  [name] Rheumatoid arthritis ([place])   . Shortness of breath   . Spinal stenosis of lumbar region   . Thyroid disease   . Unspecified lack of coordination   . URI (upper respiratory infection)    BP 140/85   Pulse 90  Ht 5' 4" (1.626 m)   Wt 268 lb (121.6 kg)   SpO2 92% Comment: o2 via nasal can at 2ltrs  BMI 46.00 kg/m   Opioid Risk Score:   Fall Risk Score:  `1  Depression screen PHQ 2/9  Depression screen [facility] 2/9 06/11/2018 05/26/2018 12/09/2017 10/22/2017 09/22/2017 07/28/2017 06/30/2017  Decreased Interest _0 0 0 0 0  Down, Depressed, Hopeless _1 0 0 0 0  PHQ - 2 Score _2 0 0 0 0  Altered sleeping 2 2 - - - - -  Tired, decreased energy 2 3 - - - - -  Change in appetite 2 1 - - - - -  Feeling bad or failure about yourself  2 2 - - - - -  Trouble concentrating 2 0 - - - - -  Moving slowly or fidgety/restless 0 2 - - - - -  Suicidal thoughts 0 0 - - - - -  PHQ-9 Score 12 13 - - - - -  Difficult doing work/chores - Somewhat difficult - - - - -  Some recent data might be hidden  '  Review of Systems  Constitutional: Negative.   HENT: Negative.   Eyes: Negative.   Respiratory: Negative.   Cardiovascular: Negative.   Gastrointestinal: Negative.   Endocrine: Negative.   Genitourinary: Positive for difficulty urinating.  Musculoskeletal: Positive for arthralgias, gait problem and myalgias.  Skin: Negative.   Allergic/Immunologic: Negative.   Neurological: Positive for weakness and numbness.  Hematological: Negative.   Psychiatric/Behavioral: Negative.   All other systems reviewed and are negative.        Assessment & Plan:  Assessment:  1. Lumbar spinal stenosis with neurogenic claudication. Associated facet arthropathy  2. Depression.  3. Diabetes mellitus type 2 with polyneuropathy  4. Rheumatoid  arthritis and osteoarthritis. Multiple joint involvement including bilateral knees.  5. Left greater troch bursitis  6. Interstitial lung disease  7.Hx of left hip fracture, post-op infection in July 2017.Patient with ongoing left hip pain along the lateral aspect/greater trochanter.    Plan:  1.continue HEP as tolerated. No pool d/t COVID -discussed the fact that we might have to start thinking about more alternative means of mobility including her scooter, a powered w/c etc -encouraged her to discuss options for treatment of her RA with rheumatology as her pain has increased since she came off her last medication 2. Continue Voltaren gel for fingers/knees.  -no further zilretta d/t lack of response 3. MSO4 for breakthrough pain, one q8 prn #90-- RF -MS Contin 21m CR q12 #60.RF  -We will continue the controlled substance monitoring program, this consists of regular clinic visits, examinations, routine drug screening, pill [place] well [place].  -Medication was refilled and a second prescription was sent to the patient's pharmacy for next month.  .  . 4 .Pulmonary mgt per pulmonologist. . 5. Continue cymbalta 617mdaily. RF today   -gabapentin RF today 6. Follow up withNP in about 2 months. 1534mtes of face to face patient/family care time were spent during this visit. All questions were encouraged and answered.

## 2018-10-12 NOTE — Telephone Encounter (Signed)
Copied from [place] [id number]. Topic: Quick Communication - See Telephone Encounter >> Oct 12, 2018  9:41 AM Ivar Drape wrote: CRM for notification. See Telephone encounter for: 10/12/18. Patient would like a 90-day refill on the following medications and have them sent to her preferred [facility] Pharm[facility]: 1) metoprolol succinate (TOPROL XL) 100 MG 24 hr tablet 2) olmesartan (BENICAR) 20 MG tablet 3) levothyroxine (SYNTHROID, LEVOTHROID) 50 MCG tablet

## 2018-10-14 ENCOUNTER — Other Ambulatory Visit: Payer: Self-pay

## 2018-10-14 DIAGNOSIS — E079 Disorder of thyroid, unspecified: Secondary | ICD-10-CM

## 2018-10-14 DIAGNOSIS — I10 Essential (primary) hypertension: Secondary | ICD-10-CM

## 2018-10-14 MED ORDER — OLMESARTAN MEDOXOMIL 20 MG PO TABS: 20.0000 mg | ORAL_TABLET | Freq: Every day | ORAL | 0 refills | Status: DC

## 2018-10-14 MED ORDER — LEVOTHYROXINE SODIUM 50 MCG PO TABS: 50.0000 ug | ORAL_TABLET | Freq: Every day | ORAL | 1 refills | Status: DC

## 2018-10-14 MED ORDER — METOPROLOL SUCCINATE ER 100 MG PO TB24: ORAL_TABLET | ORAL | 1 refills | Status: DC

## 2018-10-14 NOTE — Telephone Encounter (Signed)
Rx refills have been sent to pt pharmacy

## 2018-10-28 ENCOUNTER — Telehealth: Payer: Self-pay

## 2018-10-28 NOTE — Telephone Encounter (Signed)
Telephone call to patient to schedule palliaive care visit.  Patient in agreement with visit 11/03/18 at 1:00 PM.

## 2018-10-29 ENCOUNTER — Other Ambulatory Visit: Payer: Self-pay | Admitting: Family Medicine

## 2018-11-03 ENCOUNTER — Other Ambulatory Visit: Payer: Self-pay

## 2018-11-03 ENCOUNTER — Telehealth: Payer: Self-pay

## 2018-11-03 ENCOUNTER — Other Ambulatory Visit: Payer: Medicare Other

## 2018-11-03 DIAGNOSIS — Z515 Encounter for palliative care: Secondary | ICD-10-CM

## 2018-11-03 DIAGNOSIS — J8410 Pulmonary fibrosis, unspecified: Secondary | ICD-10-CM

## 2018-11-03 DIAGNOSIS — M1712 Unilateral primary osteoarthritis, left knee: Secondary | ICD-10-CM

## 2018-11-03 DIAGNOSIS — M48061 Spinal stenosis, lumbar region without neurogenic claudication: Secondary | ICD-10-CM

## 2018-11-03 DIAGNOSIS — M1711 Unilateral primary osteoarthritis, right knee: Secondary | ICD-10-CM

## 2018-11-03 NOTE — Telephone Encounter (Signed)
Referral made

## 2018-11-03 NOTE — Telephone Encounter (Signed)
Vergie RN Palative care nurse called stating that patient would benefit from home health physical therapy for leg strengthing and is asking if that referral can be made.  Patient has requested that the home health agency be from [facility]

## 2018-11-03 NOTE — Progress Notes (Signed)
COMMUNITY PALLIATIVE CARE SW NOTE  PATIENT NAME: [name] DOB: [birth year] MRN: [id number]  PRIMARY CARE PROVIDER: [name], MD  RESPONSIBLE PARTY:  Acct ID - Guarantor Home Phone Work Phone Relationship Acct Type  [id number] LOY, [phone] Self P/F     [address], [name], [place] [address]     PLAN OF CARE and INTERVENTIONS:             1. GOALS OF CARE/ ADVANCE CARE PLANNING:  Goal is to remain in her home. Patient has DNR in home. 2. SOCIAL/EMOTIONAL/SPIRITUAL ASSESSMENT/ INTERVENTIONS:  SW and RN met with patient for TELEHEALTH visit. Patient reports she is doing "well". Patient's swelling is better in her legs but continues to feel that her left leg is "heavy". Patient has started using her walker again to ambulate. Patient said that her blood pressure has been better. Patient reports that she has noticed increased appetite. Patient also noted that she is sleeping better and she feels more rested. Patient continues to use O2 with SOB, and is using it more at night. Patient said she has been out riding in the car, and enjoys the fresh air. 3. PATIENT/CAREGIVER EDUCATION/ COPING:  Patient was positive and focused on her improvement. Patient appreciative of team support. 4. PERSONAL EMERGENCY PLAN:  Patient/family will call 9-1-1 for emergencies.Due to COVID-19 crisis, patient verbalized thatshe is being cautious ad limiting contact. 5. COMMUNITY RESOURCES COORDINATION/ HEALTH CARE NAVIGATION:  Discussed PT to assist with navigating movement of her left leg. RN to follow-up.  6. FINANCIAL/LEGAL CONCERNS/INTERVENTIONS:  None.     SOCIAL HX:  Social History   Tobacco Use  . Smoking status: Never Smoker  . Smokeless tobacco: Never Used  Substance Use Topics  . Alcohol use: No    CODE STATUS:   Code Status: Prior  ADVANCED DIRECTIVES: N MOST FORM COMPLETE:  No. HOSPICE EDUCATION PROVIDED: None at this time.  PPS: Patient is  independent with ADLs, and is back to ambulating with her walker.  Due to the COVID-19 crisis, this visit was done viaZoomfrom my office and it was initiated and consent by this patient.This was a scheduled visit.  I spent7mnutes with patient/family, from1:00-1:30pproviding education, support and consultation.   WMargaretmary Lombard LCSW

## 2018-11-03 NOTE — Progress Notes (Signed)
PATIENT NAME: [name] DOB: December 12, 1940 MRN: [id number]  PRIMARY CARE PROVIDER: [name], MD  RESPONSIBLE PARTY:  Acct ID - Guarantor Home Phone Work Phone Relationship Acct Type  [id number] MICHAL, [phone] Self P/F     [address], [name], [place] [address]    PLAN OF CARE and INTERVENTIONS:               1.  GOALS OF CARE/ ADVANCE CARE PLANNING: Remain at home with family.                2.  PATIENT/CAREGIVER EDUCATION:  Education on fall precautions, education on s/s of infection.               3.  DISEASE STATUS: Patient is a 78 year old patient who resides at home with her family.  SW and RN met with patient for telehealth visit. Patient reports she is feeling better overall.  Patient reports the past 2-3 days she has not been having as much pain in her legs and swelling has decreased.  Patient reports she has right leg wrapped in compression wrap.  Patient reports she has difficulty getting right leg up onto bed at night.  Patient states family members have to assist her at times with getting leg up on to bed.  Patient in agreement with RN calling Dr Charm Barges office and inquiring about PT referral.  Patient reports her B/P has been lower and she has been taking her meds as directed.  Patient also reports her blood sugars have been better, BS 115 this AM.  Patient also reports her appetite is better and she feels hungry.  Patient reports she had family put wheelchair in the garage and she has been walking more.  Patient states she doesn't feel as tired as she was feeling. Patient states the past 2-3 days have been good.  Patient continues to wear her O2 as needed.  Patient not wearing O2 during visit. RN placed call to Dr Charm Barges office requesting PT referral for patient.  Patient reports she is sleeping well.  Patient denies having any concerns other than request for PT referral.  Patient encouraged to call with questions or concerns.                   HISTORY OF PRESENT ILLNESS:    CODE STATUS: DNR  ADVANCED DIRECTIVES: N MOST FORM: No PPS: 50%   PHYSICAL EXAM:   VITALS: B/P taken by patient this AM  LUNGS: shortness of breath on exertion, denies cough CARDIAC:  EXTREMITIES: 2+ edema in LE's compression wrap to right lower extremity SKIN: Skin color, texture, turgor normal. No rashes or lesions  NEURO: Alert and oriented x 4       Nilda Simmer, RN

## 2018-11-06 DIAGNOSIS — J9611 Chronic respiratory failure with hypoxia: Secondary | ICD-10-CM | POA: Diagnosis not present

## 2018-11-06 DIAGNOSIS — M069 Rheumatoid arthritis, unspecified: Secondary | ICD-10-CM | POA: Diagnosis not present

## 2018-11-06 DIAGNOSIS — M170 Bilateral primary osteoarthritis of knee: Secondary | ICD-10-CM | POA: Diagnosis not present

## 2018-11-06 DIAGNOSIS — Z9981 Dependence on supplemental oxygen: Secondary | ICD-10-CM | POA: Diagnosis not present

## 2018-11-06 DIAGNOSIS — M109 Gout, unspecified: Secondary | ICD-10-CM | POA: Diagnosis not present

## 2018-11-06 DIAGNOSIS — I5032 Chronic diastolic (congestive) heart failure: Secondary | ICD-10-CM | POA: Diagnosis not present

## 2018-11-06 DIAGNOSIS — I959 Hypotension, unspecified: Secondary | ICD-10-CM | POA: Diagnosis not present

## 2018-11-06 DIAGNOSIS — E1142 Type 2 diabetes mellitus with diabetic polyneuropathy: Secondary | ICD-10-CM | POA: Diagnosis not present

## 2018-11-06 DIAGNOSIS — Z9181 History of falling: Secondary | ICD-10-CM | POA: Diagnosis not present

## 2018-11-06 DIAGNOSIS — E1122 Type 2 diabetes mellitus with diabetic chronic kidney disease: Secondary | ICD-10-CM | POA: Diagnosis not present

## 2018-11-06 DIAGNOSIS — J8410 Pulmonary fibrosis, unspecified: Secondary | ICD-10-CM | POA: Diagnosis not present

## 2018-11-06 DIAGNOSIS — Z79891 Long term (current) use of opiate analgesic: Secondary | ICD-10-CM | POA: Diagnosis not present

## 2018-11-06 DIAGNOSIS — F329 Major depressive disorder, single episode, unspecified: Secondary | ICD-10-CM | POA: Diagnosis not present

## 2018-11-06 DIAGNOSIS — Z8701 Personal history of pneumonia (recurrent): Secondary | ICD-10-CM | POA: Diagnosis not present

## 2018-11-06 DIAGNOSIS — D509 Iron deficiency anemia, unspecified: Secondary | ICD-10-CM | POA: Diagnosis not present

## 2018-11-06 DIAGNOSIS — E039 Hypothyroidism, unspecified: Secondary | ICD-10-CM | POA: Diagnosis not present

## 2018-11-06 DIAGNOSIS — M5416 Radiculopathy, lumbar region: Secondary | ICD-10-CM | POA: Diagnosis not present

## 2018-11-06 DIAGNOSIS — I130 Hypertensive heart and chronic kidney disease with heart failure and stage 1 through stage 4 chronic kidney disease, or unspecified chronic kidney disease: Secondary | ICD-10-CM | POA: Diagnosis not present

## 2018-11-06 DIAGNOSIS — J449 Chronic obstructive pulmonary disease, unspecified: Secondary | ICD-10-CM | POA: Diagnosis not present

## 2018-11-06 DIAGNOSIS — M48061 Spinal stenosis, lumbar region without neurogenic claudication: Secondary | ICD-10-CM | POA: Diagnosis not present

## 2018-11-06 DIAGNOSIS — I824Z2 Acute embolism and thrombosis of unspecified deep veins of left distal lower extremity: Secondary | ICD-10-CM | POA: Diagnosis not present

## 2018-11-06 DIAGNOSIS — Z7901 Long term (current) use of anticoagulants: Secondary | ICD-10-CM | POA: Diagnosis not present

## 2018-11-06 DIAGNOSIS — G8929 Other chronic pain: Secondary | ICD-10-CM | POA: Diagnosis not present

## 2018-11-06 DIAGNOSIS — K5903 Drug induced constipation: Secondary | ICD-10-CM | POA: Diagnosis not present

## 2018-11-06 DIAGNOSIS — N189 Chronic kidney disease, unspecified: Secondary | ICD-10-CM | POA: Diagnosis not present

## 2018-11-10 ENCOUNTER — Telehealth: Payer: Self-pay | Admitting: *Deleted

## 2018-11-10 ENCOUNTER — Other Ambulatory Visit: Payer: Self-pay

## 2018-11-10 ENCOUNTER — Ambulatory Visit (INDEPENDENT_AMBULATORY_CARE_PROVIDER_SITE_OTHER): Payer: Medicare Other | Admitting: Family Medicine

## 2018-11-10 DIAGNOSIS — R05 Cough: Secondary | ICD-10-CM | POA: Diagnosis not present

## 2018-11-10 DIAGNOSIS — Z20828 Contact with and (suspected) exposure to other viral communicable diseases: Secondary | ICD-10-CM

## 2018-11-10 DIAGNOSIS — R059 Cough, unspecified: Secondary | ICD-10-CM

## 2018-11-10 DIAGNOSIS — Z20822 Contact with and (suspected) exposure to covid-19: Secondary | ICD-10-CM

## 2018-11-10 NOTE — Telephone Encounter (Signed)
Pt scheduled for covid 19 testing tomorrow at [facility] site.  Pt with cough, congestion.  Requested by Dr. Carolann Littler at [facility] practice  Testing process reviewed, stay in car, wear mask. Pt verbalizes understanding.  Pts CB# [phone] [id number]

## 2018-11-10 NOTE — Progress Notes (Signed)
Patient ID: [name], female   DOB: 1941/02/10, 78 y.o.   MRN: [id number]  This visit type was conducted due to national recommendations for restrictions regarding the COVID-19 pandemic in an effort to limit this patient's exposure and mitigate transmission in our community.   Virtual Visit via Video Note  I connected with [name] on 11/10/18 at  3:45 PM EDT by a video enabled telemedicine application and verified that I am speaking with the correct person using two identifiers.  Location patient: home Location provider:work or home office Persons participating in the virtual visit: patient, provider  I discussed the limitations of evaluation and management by telemedicine and the availability of in person appointments. The patient expressed understanding and agreed to proceed.   HPI:  Patient had onset last night of some nasal congestion and cough.  She has multiple chronic problems including history of obesity, hypertension, chronic diastolic heart failure, pulmonary fibrosis, GERD, type 2 diabetes.  She denies any sick contacts but did have several family members over visiting this past Sunday.  She has some cough which is productive of clear sputum.  O2 sats on room air been 91 to 93% which she states are fairly good for her.  No fever.  She states her symptoms are much like a typical cold.  She has had some nasal congestion.  No facial pain.  No sore throat.  She has home oxygen which she uses some at night.  She has inhalers including nebulizers at home which she has not been using much recently   ROS: See pertinent positives and negatives per HPI.  Past Medical History:  Diagnosis Date  . Acute on chronic diastolic (congestive) heart failure ([place])   . Anemia    iron deficiency anemia - secondary to blood loss ( chronic)   . Arthritis    endstage changes bilateral knees/bilateral ankles.   . Asthma   . Carotid artery occlusion   . Chronic fatigue   . Chronic  kidney disease   . Closed left hip fracture ([place])   . Clotting disorder ([place])    pt denies this  . Contusion of left knee    due to fall 1/14.  [name] COPD (chronic obstructive pulmonary disease) (HCC)    pulmonary fibrosis  . Depression, reactive   . Diabetes mellitus    type II   . Diastolic dysfunction   . Difficulty in walking   . Family history of heart disease   . Generalized muscle weakness   . Gout   . High cholesterol   . History of falling   . Hypertension   . Hypothyroidism   . Interstitial lung disease ([place])   . 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    . Morbid obesity ([place])   . Morbid obesity ([place])   . Neuromuscular disorder ([place])    diabetic neuropathy   . Normal coronary arteries    cardiac catheterization performed  10/31/14  . RA (rheumatoid arthritis) ([place])    has been off methotreaxte since 10/13.  [name] Rheumatoid arthritis ([place])   . Shortness of breath   . Spinal stenosis of lumbar region   . Thyroid disease   . Unspecified lack of coordination   . URI (upper respiratory infection)     Past Surgical History:  Procedure Laterality Date  . ABDOMINAL HYSTERECTOMY    . BRAIN SURGERY     Gamma knife  10/13. Needs repeat spring  '14  . CARDIAC CATHETERIZATION N/A 10/31/2014   Procedure: Right/Left Heart Cath and Coronary Angiography;  Surgeon: [name], MD;  Location: [facility];  Service: Cardiovascular;  Laterality: N/A;  . ESOPHAGOGASTRODUODENOSCOPY (EGD) WITH PROPOFOL N/A 09/14/2014   Procedure: ESOPHAGOGASTRODUODENOSCOPY (EGD) WITH PROPOFOL;  Surgeon: [name], MD;  Location: [facility];  Service: Endoscopy;  Laterality: N/A;  . INCISION AND DRAINAGE HIP Left 01/16/2017   Procedure: IRRIGATION AND DEBRIDEMENT LEFT HIP;  Surgeon: [name], MD;  Location: [facility];  Service: Orthopedics;  Laterality: Left;  . INTRAMEDULLARY (IM)  NAIL INTERTROCHANTERIC Left 11/29/2016   Procedure: INTRAMEDULLARY (IM) NAIL INTERTROCHANTRIC;  Surgeon: [name], MD;  Location: [facility];  Service: Orthopedics;  Laterality: Left;  . OVARY SURGERY    . SHOULDER SURGERY Left   . TONSILLECTOMY  age 76  . VIDEO BRONCHOSCOPY Bilateral 05/31/2013   Procedure: VIDEO BRONCHOSCOPY WITHOUT FLUORO;  Surgeon: Brand Males, MD;  Location: [facility];  Service: Cardiopulmonary;  Laterality: Bilateral;  . video bronscoscopy  2000   lung    Family History  Problem Relation Age of Onset  . Diabetes Mother   . Heart attack Mother   . Hypertension Father   . Lung cancer Father   . Diabetes Sister   . Diabetes Brother   . Hypertension Brother   . Heart disease Brother   . Heart attack Brother   . Kidney cancer Brother   . Uterine cancer Daughter   . Breast cancer Sister   . Rheum arthritis Maternal Uncle   . Gout Brother   . Kidney failure Brother        x 5    SOCIAL HX: Non-smoker   Current Outpatient Medications:  .  acetaminophen (TYLENOL) 500 MG tablet, Take 1,000 mg by mouth every 6 (six) hours as needed for moderate pain. , Disp: , Rfl:  .  albuterol (PROVENTIL HFA;VENTOLIN HFA) 108 (90 BASE) MCG/ACT inhaler, Inhale 2 puffs into the lungs every 6 (six) hours as needed for wheezing or shortness of breath., Disp: , Rfl:  .  allopurinol (ZYLOPRIM) 300 MG tablet, Take 300 mg by mouth daily., Disp: , Rfl:  .  apixaban (ELIQUIS) 5 MG TABS tablet, Take 1 tablet (5 mg total) by mouth 2 (two) times daily., Disp: 20 tablet, Rfl: 0 .  atorvastatin (LIPITOR) 20 MG tablet, Take 10 mg by mouth every evening. , Disp: , Rfl:  .  azithromycin (ZITHROMAX) 500 MG tablet, Take 1 tablet by mouth daily. TAKE ON MON, WED & FRI, Disp: , Rfl: 0 .  Calcium Carb-Cholecalciferol (CALCIUM + D3 PO), Take 2 tablets by mouth daily with lunch. , Disp: , Rfl:  .  cholecalciferol (VITAMIN D) 1000 UNITS tablet, Take 2,000 Units by mouth daily with lunch.  , Disp: , Rfl:  .  colchicine 0.6 MG tablet, Take 0.6 mg by mouth daily., Disp: , Rfl:  .  cyclobenzaprine (FLEXERIL) 5 MG tablet, Take 1 tablet (5 mg total) by mouth at bedtime as needed for muscle spasms., Disp: 90 tablet, Rfl: 3 .  diclofenac sodium (VOLTAREN) 1 % GEL, Apply 1 application topically 3 (three) times daily. Both hands and knees, Disp: 3 Tube, Rfl: 4 .  docusate sodium (COLACE) 100 MG capsule, Take 1 capsule (100 mg total) by mouth 2 (two) times daily., Disp: 10 capsule, Rfl: 0 .  DULoxetine (CYMBALTA) 60 MG capsule, Take 1 capsule (60 mg total) by mouth daily.,  Disp: 90 capsule, Rfl: 3 .  fluticasone (FLONASE) 50 MCG/ACT nasal spray, Place 2 sprays into both nostrils daily., Disp: 16 g, Rfl: 3 .  furosemide (LASIX) 20 MG tablet, TAKE 3 TABLETS BY MOUTH ONCE DAILY, Disp: 60 tablet, Rfl: 1 .  gabapentin (NEURONTIN) 600 MG tablet, Take two in the morning , Two mid- afternoon and two at bedtime, Disp: 540 tablet, Rfl: 3 .  guaiFENesin (MUCINEX) 600 MG 12 hr tablet, Take 1,200 mg by mouth 2 (two) times daily., Disp: , Rfl:  .  hydroxychloroquine (PLAQUENIL) 200 MG tablet, Take 200 mg by mouth daily., Disp: , Rfl:  .  ipratropium-albuterol (DUONEB) 0.5-2.5 (3) MG/3ML SOLN, Take 3 mLs by nebulization every 6 (six) hours as needed (cough/wheezing). , Disp: , Rfl:  .  levothyroxine (SYNTHROID) 50 MCG tablet, Take 1 tablet (50 mcg total) by mouth daily., Disp: 90 tablet, Rfl: 1 .  linaclotide (LINZESS) 72 MCG capsule, Take 1 capsule (72 mcg total) by mouth daily before breakfast., Disp: 90 capsule, Rfl: 1 .  metoprolol succinate (TOPROL XL) 100 MG 24 hr tablet, Take 1/2 tablet by mouth daily with or immediately following a meal., Disp: 90 tablet, Rfl: 1 .  morphine (MS CONTIN) 15 MG 12 hr tablet, Take 1 tablet (15 mg total) by mouth every 12 (twelve) hours., Disp: 60 tablet, Rfl: 0 .  morphine (MSIR) 15 MG tablet, Take 1 tablet (15 mg total) by mouth 2 (two) times daily as needed for moderate  pain (Breakthrough Pain)., Disp: 60 tablet, Rfl: 0 .  Multiple Vitamin (MULTIVITAMIN WITH MINERALS) TABS, Take 1 tablet by mouth daily. , Disp: , Rfl:  .  olmesartan (BENICAR) 20 MG tablet, Take 1 tablet (20 mg total) by mouth daily., Disp: 90 tablet, Rfl: 0 .  OXYGEN-HELIUM IN, Inhale 2 L into the lungs at bedtime. And on exertion, Disp: , Rfl:  .  pantoprazole (PROTONIX) 40 MG tablet, take 1 tablet by mouth once daily MINUTES BEFORE 1ST MEAL OF THE DAY, Disp: 90 tablet, Rfl: 1 .  potassium chloride (K-DUR,KLOR-CON) 10 MEQ tablet, Take 10 mEq by mouth daily. , Disp: , Rfl:  .  predniSONE (DELTASONE) 5 MG tablet, Take 12.5 mg by mouth every morning. , Disp: , Rfl: 0 .  Semaglutide,0.25 or 0.5MG/DOS, (OZEMPIC, 0.25 OR 0.5 MG/DOSE,) 2 MG/1.5ML SOPN, Inject 0.5 mg into the skin once a week., Disp: 3 pen, Rfl: 1 .  sodium chloride (OCEAN) 0.65 % SOLN nasal spray, Place 1 spray into both nostrils as needed for congestion., Disp: , Rfl:  .  Spacer/Aero-Holding Chambers (AEROCHAMBER MV) inhaler, Use as instructed, Disp: 1 each, Rfl: 0  EXAM:  VITALS per patient if applicable:  GENERAL: alert, oriented, appears well and in no acute distress  HEENT: atraumatic, conjunttiva clear, no obvious abnormalities on inspection of external nose and ears  NECK: normal movements of the head and neck  LUNGS: on inspection no signs of respiratory distress, breathing rate appears normal, no obvious gross SOB, gasping or wheezing  CV: no obvious cyanosis  MS: moves all visible extremities without noticeable abnormality  PSYCH/NEURO: pleasant and cooperative, no obvious depression or anxiety, speech and thought processing grossly intact  ASSESSMENT AND PLAN:  Discussed the following assessment and plan:  Cough and nasal congestion.  Patient currently in no respiratory distress and her O2 sats at home are near baseline on room air.  She is very high risk for complications because of her multiple comorbidities  and history of pulmonary fibrosis  -  Recommend COVID-19 screening -Continue her usual home inhalers and oxygen as needed -Follow-up immediately for any increased dyspnea or other concerns -She will continue over-the-counter Mucinex -Low threshold for antibiotics if her COVID screening is negative and she develops more productive cough   I discussed the assessment and treatment plan with the patient. The patient was provided an opportunity to ask questions and all were answered. The patient agreed with the plan and demonstrated an understanding of the instructions.   The patient was advised to call back or seek an in-person evaluation if the symptoms worsen or if the condition fails to improve as anticipated.   Carolann Littler, MD

## 2018-11-11 ENCOUNTER — Other Ambulatory Visit: Payer: Medicare Other

## 2018-11-11 DIAGNOSIS — Z20822 Contact with and (suspected) exposure to covid-19: Secondary | ICD-10-CM

## 2018-11-11 DIAGNOSIS — R6889 Other general symptoms and signs: Secondary | ICD-10-CM | POA: Diagnosis not present

## 2018-11-12 ENCOUNTER — Telehealth: Payer: Self-pay | Admitting: *Deleted

## 2018-11-12 DIAGNOSIS — M170 Bilateral primary osteoarthritis of knee: Secondary | ICD-10-CM | POA: Diagnosis not present

## 2018-11-12 DIAGNOSIS — M47816 Spondylosis without myelopathy or radiculopathy, lumbar region: Secondary | ICD-10-CM

## 2018-11-12 DIAGNOSIS — M48061 Spinal stenosis, lumbar region without neurogenic claudication: Secondary | ICD-10-CM | POA: Diagnosis not present

## 2018-11-12 DIAGNOSIS — M7062 Trochanteric bursitis, left hip: Secondary | ICD-10-CM

## 2018-11-12 DIAGNOSIS — M1712 Unilateral primary osteoarthritis, left knee: Secondary | ICD-10-CM

## 2018-11-12 DIAGNOSIS — I130 Hypertensive heart and chronic kidney disease with heart failure and stage 1 through stage 4 chronic kidney disease, or unspecified chronic kidney disease: Secondary | ICD-10-CM | POA: Diagnosis not present

## 2018-11-12 DIAGNOSIS — E1142 Type 2 diabetes mellitus with diabetic polyneuropathy: Secondary | ICD-10-CM | POA: Diagnosis not present

## 2018-11-12 DIAGNOSIS — M1711 Unilateral primary osteoarthritis, right knee: Secondary | ICD-10-CM

## 2018-11-12 DIAGNOSIS — E1122 Type 2 diabetes mellitus with diabetic chronic kidney disease: Secondary | ICD-10-CM | POA: Diagnosis not present

## 2018-11-12 DIAGNOSIS — I5032 Chronic diastolic (congestive) heart failure: Secondary | ICD-10-CM | POA: Diagnosis not present

## 2018-11-12 MED ORDER — MORPHINE SULFATE 15 MG PO TABS: 15.0000 mg | ORAL_TABLET | Freq: Two times a day (BID) | ORAL | 0 refills | Status: DC | PRN

## 2018-11-12 MED ORDER — MORPHINE SULFATE ER 15 MG PO TBCR: 15.0000 mg | EXTENDED_RELEASE_TABLET | Freq: Two times a day (BID) | ORAL | 0 refills | Status: DC

## 2018-11-12 NOTE — Telephone Encounter (Signed)
Call placed to [name], she reports she is taking her MSIR twice a day as prescribed. PMP was reviewed. MS Contin and MSIR reviewed. [name] is aware of the above and verbalizes understanding.

## 2018-11-12 NOTE — Telephone Encounter (Signed)
[name] saw Dr Naaman Plummer last month and he told her to come back in 2 months but did not give her 2nd Rx for this month,  Her appt is 12/08/18 with Zella Ball and her med refill was 10/12/18. Zella Ball can you refill these for her please?

## 2018-11-15 DIAGNOSIS — I130 Hypertensive heart and chronic kidney disease with heart failure and stage 1 through stage 4 chronic kidney disease, or unspecified chronic kidney disease: Secondary | ICD-10-CM | POA: Diagnosis not present

## 2018-11-15 DIAGNOSIS — E1122 Type 2 diabetes mellitus with diabetic chronic kidney disease: Secondary | ICD-10-CM | POA: Diagnosis not present

## 2018-11-15 DIAGNOSIS — I5032 Chronic diastolic (congestive) heart failure: Secondary | ICD-10-CM | POA: Diagnosis not present

## 2018-11-15 DIAGNOSIS — M170 Bilateral primary osteoarthritis of knee: Secondary | ICD-10-CM | POA: Diagnosis not present

## 2018-11-15 DIAGNOSIS — M48061 Spinal stenosis, lumbar region without neurogenic claudication: Secondary | ICD-10-CM | POA: Diagnosis not present

## 2018-11-15 DIAGNOSIS — E1142 Type 2 diabetes mellitus with diabetic polyneuropathy: Secondary | ICD-10-CM | POA: Diagnosis not present

## 2018-11-17 DIAGNOSIS — M170 Bilateral primary osteoarthritis of knee: Secondary | ICD-10-CM | POA: Diagnosis not present

## 2018-11-17 DIAGNOSIS — I130 Hypertensive heart and chronic kidney disease with heart failure and stage 1 through stage 4 chronic kidney disease, or unspecified chronic kidney disease: Secondary | ICD-10-CM | POA: Diagnosis not present

## 2018-11-17 DIAGNOSIS — M48061 Spinal stenosis, lumbar region without neurogenic claudication: Secondary | ICD-10-CM | POA: Diagnosis not present

## 2018-11-17 DIAGNOSIS — I5032 Chronic diastolic (congestive) heart failure: Secondary | ICD-10-CM | POA: Diagnosis not present

## 2018-11-17 DIAGNOSIS — E1122 Type 2 diabetes mellitus with diabetic chronic kidney disease: Secondary | ICD-10-CM | POA: Diagnosis not present

## 2018-11-17 DIAGNOSIS — E1142 Type 2 diabetes mellitus with diabetic polyneuropathy: Secondary | ICD-10-CM | POA: Diagnosis not present

## 2018-11-17 LAB — NOVEL CORONAVIRUS, NAA: SARS-CoV-2, NAA: NOT DETECTED

## 2018-11-23 ENCOUNTER — Telehealth: Payer: Self-pay | Admitting: *Deleted

## 2018-11-23 DIAGNOSIS — E1142 Type 2 diabetes mellitus with diabetic polyneuropathy: Secondary | ICD-10-CM | POA: Diagnosis not present

## 2018-11-23 DIAGNOSIS — I5032 Chronic diastolic (congestive) heart failure: Secondary | ICD-10-CM | POA: Diagnosis not present

## 2018-11-23 DIAGNOSIS — E1122 Type 2 diabetes mellitus with diabetic chronic kidney disease: Secondary | ICD-10-CM | POA: Diagnosis not present

## 2018-11-23 DIAGNOSIS — M170 Bilateral primary osteoarthritis of knee: Secondary | ICD-10-CM | POA: Diagnosis not present

## 2018-11-23 DIAGNOSIS — I130 Hypertensive heart and chronic kidney disease with heart failure and stage 1 through stage 4 chronic kidney disease, or unspecified chronic kidney disease: Secondary | ICD-10-CM | POA: Diagnosis not present

## 2018-11-23 DIAGNOSIS — M48061 Spinal stenosis, lumbar region without neurogenic claudication: Secondary | ICD-10-CM | POA: Diagnosis not present

## 2018-11-23 NOTE — Telephone Encounter (Signed)
Script Screening patients for COVID-19 and reviewing new operational procedures  Greeting - The reason I am calling is to share with you some new changes to our processes that are designed to help [place] keep everyone safe. Is now a good time to speak with you? Patient says "no' - ask them when you can call back and let them know it's important to do this prior to their appointment.  Patient says "yes" - Great, Raymonda the first thing I need to do is ask you some screening Questions.  1. To the best of your knowledge, have you been in close contact with any one with a confirmed diagnosis of COVID 19? o No - proceed to next question  2. Have you had any one or more of the following: fever, chills, cough, shortness of breath or any flu-like symptoms? o No - proceed to next question  3. Have you been diagnosed with or have a previous diagnosis of COVID 19? o No - proceed to next question  4. I am going to go over a few other symptoms with you. Please let me know if you are experiencing any of the following: . Ear, nose or throat discomfort . A sore throat . Headache . Muscle pain . Diarrhea . Loss of taste or smell o No - proceed to next question  Thank you for answering these questions. Please know we will ask you these questions or similar questions when you arrive for your appointment and again it's how we are keeping everyone safe. Also, to keep you safe, please use the provided hand sanitizer when you enter the building. Shanina, we are asking everyone in the building to wear a mask because they help [place] prevent the spread of germs. Do you have a mask of your own, if not, we are happy to provide one for you. The last thing I want to go over with you is the no visitor guidelines. This means no one can attend the appointment with you unless you need physical assistance. I understand this may be different from your past appointments and I know this may be difficult but please know if  someone is driving you we are happy to call them for you once your appointment is over.  [INSERT SITE SPECIFIC CHECK IN PROCEDURES]  Steele I've given you a lot of information, what questions do you have about what I've talked about today or your appointment tomorrow?

## 2018-11-24 ENCOUNTER — Ambulatory Visit (INDEPENDENT_AMBULATORY_CARE_PROVIDER_SITE_OTHER)
Admission: RE | Admit: 2018-11-24 | Discharge: 2018-11-24 | Disposition: A | Discharge status: Home / Self Care | Payer: Medicare Other | Source: Ambulatory Visit | Attending: Internal Medicine | Admitting: Internal Medicine

## 2018-11-24 ENCOUNTER — Other Ambulatory Visit: Payer: Self-pay

## 2018-11-24 DIAGNOSIS — J849 Interstitial pulmonary disease, unspecified: Secondary | ICD-10-CM | POA: Diagnosis not present

## 2018-11-24 DIAGNOSIS — R0602 Shortness of breath: Secondary | ICD-10-CM | POA: Diagnosis not present

## 2018-11-29 ENCOUNTER — Telehealth: Payer: Self-pay

## 2018-11-29 DIAGNOSIS — R0789 Other chest pain: Secondary | ICD-10-CM | POA: Diagnosis not present

## 2018-11-29 DIAGNOSIS — Z6841 Body Mass Index (BMI) 40.0 and over, adult: Secondary | ICD-10-CM | POA: Diagnosis not present

## 2018-11-29 DIAGNOSIS — I272 Pulmonary hypertension, unspecified: Secondary | ICD-10-CM | POA: Diagnosis not present

## 2018-11-29 DIAGNOSIS — J479 Bronchiectasis, uncomplicated: Secondary | ICD-10-CM | POA: Diagnosis not present

## 2018-11-30 NOTE — Telephone Encounter (Signed)
Telephone call to patient to schedule TELEHEALTH visit with patient. Patient in agreement with TELEHEALTH visit on 12-01-18 at 1:00 PM.

## 2018-12-01 ENCOUNTER — Other Ambulatory Visit: Payer: Medicare Other

## 2018-12-01 ENCOUNTER — Telehealth: Payer: Self-pay | Admitting: Family Medicine

## 2018-12-01 ENCOUNTER — Other Ambulatory Visit: Payer: Self-pay

## 2018-12-01 DIAGNOSIS — Z515 Encounter for palliative care: Secondary | ICD-10-CM

## 2018-12-01 NOTE — Progress Notes (Signed)
COMMUNITY PALLIATIVE CARE SW NOTE  PATIENT NAME: [name] DOB: 1940-06-21 MRN: [id number]  PRIMARY CARE PROVIDER: [name], MD  RESPONSIBLE PARTY:  Acct ID - Guarantor Home Phone Work Phone Relationship Acct Type  [id number] ADDYSON, [id number] Self P/F     [address], [name], [place] [address]     PLAN OF CARE and INTERVENTIONS:             1. GOALS OF CARE/ ADVANCE CARE PLANNING:  Goal is to remainathome. Patient has DNR in home. 2. SOCIAL/EMOTIONAL/SPIRITUAL ASSESSMENT/ INTERVENTIONS:  SW and RN met with patient for TELEHEALTH visit. Patient reports she is doing "good". Patient noted continued swelling and pain in her legs. Patient is focused on elevating her legs and walking around every two hours. Patient said she has had to use the wheelchair recently due to swelling. Patient said she is sleeping well. Patient reports that her appetite is not great. Patient talked about gardening and her daughter helping with food. Patient has supportive family and enjoys time with them. 3. PATIENT/CAREGIVER EDUCATION/ COPING:  Patient was in good spirits, smiling and laughing during visit. Patient is "bothered" by her legs swelling and wants to improve, but is focused on doing what she can to get better. Patient expressed appreciation for team support. 4. PERSONAL EMERGENCY PLAN:  Patient/familywill call 9-1-1 for emergencies.Due to COVID-19 crisis, patient verbalized thatshe is being cautious. Patient's son tested positive for COVID-19 this week but is isolating himself and has not been in contact with patient. 5. COMMUNITY RESOURCES COORDINATION/ HEALTH CARE NAVIGATION:  Patient completed PT and felt this helped with strength in her legs and with getting in and out of bed. Patient went to PCP on Monday. Patient is scheduled to go back on July 29th for a follow-up and stress test. 6. FINANCIAL/LEGAL CONCERNS/INTERVENTIONS:  None.     SOCIAL HX:  Social  History   Tobacco Use  . Smoking status: Never Smoker  . Smokeless tobacco: Never Used  Substance Use Topics  . Alcohol use: No    CODE STATUS:   Code Status: Prior  ADVANCED DIRECTIVES: N MOST FORM COMPLETE:  No HOSPICE EDUCATION PROVIDED: None.   JDB:ZMCEYEM is independent with ADLs. Patient is using wheelchair when she has increased pain and swelling in her legs.   Due to the COVID-19 crisis, this visit was done viaZoomfrom my office and it was initiated and consent by this patient.This was a scheduled visit.  I spent34mnutes with patient/family, from1:00-1:30pproviding education, support and consultation.  WMargaretmary Lombard LCSW

## 2018-12-01 NOTE — Progress Notes (Signed)
PATIENT NAME: [name] DOB: 1940/10/07 MRN: [id number]  PRIMARY CARE PROVIDER: [name], MD  RESPONSIBLE PARTY:  Acct ID - Guarantor Home Phone Work Phone Relationship Acct Type  [id number] [name], [phone] Self P/F     [address], [name], [place] [address]    PLAN OF CARE and INTERVENTIONS:               1.  GOALS OF CARE/ ADVANCE CARE PLANNING: Patient wants to remain in her home with family and have symptoms managed.               2.  PATIENT/CAREGIVER EDUCATION: Education on fall precautions, education on s/s of infection, education to keep lower extremities elevated to reduce edema, reviewed medications.              3.  DISEASE STATUS: Patient is a 78 year old patient who resides at home with her husband. Due to the COVID-19 crisis, this visit was done via telemedicine from my office and it was initiated and consent by this patient and or family. SW and RN met with patient for telehealth visit.  Patient reports she has MD appointment at [facility] on Monday.  Patient reports she is to have a ECHO and stress test on July 29th.  Patient reports MD is concerned about her pulmonary hypertension.  Patient reports she is having increased swelling in her legs.  Patient reports she can't tolerate having legs wrapped for longer than 2 hours due to discomfort. Patient reports MD instructed her to take extra lasix every other day for swelling.  Patient reports PT visited for a few weeks and she is now able to get in and put of bed and get her legs up on bed.  Patient continues to use O2 at night.  Patient also does nebulizer treatments at least 2 times per day.  Patient reports she has a productive cough of pale yellow sputum.  Patient reports she has been walking using walker but due to increased swelling and discomfort in her legs she has to use wheelchair yesterday.  Patient reports she sleeps well at night but has to get up several times to use the bathroom.   Patient has been reluctant to take extra pain meds.  Patient states she sometimes takes Tylenol.  Patient reports pain meds do not help much with pain in her legs.  Patient in agreement with RN making telephone call 1st week of August to follow up on results of ECHO and stress test.  Patient in agreement with SW and RN Making home visit next month.  Patient denies having any needs or concerns.  Patient encouraged to call with needs.                 ent     HISTORY OF PRESENT ILLNESS:    CODE STATUS: DNR  ADVANCED DIRECTIVES: N MOST FORM: N PPS: 50%   PHYSICAL EXAM:   VITALS: No vital signs taken telehealth visit  LUNGS: shortness of breath on exertion, uses O2 at night productive cough of pale yellow sputum CARDIAC:  EXTREMITIES: Patient reports increased swelling in LW's left greater than the right SKIN: patient denies having any open areas of skin breakdown   NEURO: Alert and oriented x 4       Nilda Simmer, RN

## 2018-12-01 NOTE — Telephone Encounter (Signed)
Patient would like 90 day refills on the following medications and have them sent to her preferred pharmacy, [facility], [address], [place]. [phone]:   1. linaclotide (LINZESS) 72 MCG capsule 2. pantoprazole (PROTONIX) 40 MG tablet 3. potassium chloride (K-DUR,KLOR-CON) 10 MEQ tablet 4. Semaglutide,0.25 or 0.5MG/DOS, (OZEMPIC, 0.25 OR 0.5 MG/DOSE,) 2 MG/1.5ML SOPN  5. fluticasone (FLONASE) 50 MCG/ACT nasal spray  6. levothyroxine (SYNTHROID) 50 MCG tablet  7. olmesartan (BENICAR) 20 MG tablet  8. apixaban (ELIQUIS) 5 MG TABS tablet

## 2018-12-03 ENCOUNTER — Other Ambulatory Visit: Payer: Self-pay

## 2018-12-03 DIAGNOSIS — K5903 Drug induced constipation: Secondary | ICD-10-CM

## 2018-12-03 DIAGNOSIS — E079 Disorder of thyroid, unspecified: Secondary | ICD-10-CM

## 2018-12-03 DIAGNOSIS — E119 Type 2 diabetes mellitus without complications: Secondary | ICD-10-CM

## 2018-12-03 MED ORDER — PANTOPRAZOLE SODIUM 40 MG PO TBEC: DELAYED_RELEASE_TABLET | ORAL | 1 refills | Status: DC

## 2018-12-03 MED ORDER — APIXABAN 5 MG PO TABS: 5.0000 mg | ORAL_TABLET | Freq: Two times a day (BID) | ORAL | 0 refills | Status: DC

## 2018-12-03 MED ORDER — LEVOTHYROXINE SODIUM 50 MCG PO TABS: 50.0000 ug | ORAL_TABLET | Freq: Every day | ORAL | 1 refills | Status: DC

## 2018-12-03 MED ORDER — OZEMPIC (0.25 OR 0.5 MG/DOSE) 2 MG/1.5ML ~~LOC~~ SOPN: 0.5000 mg | PEN_INJECTOR | SUBCUTANEOUS | 1 refills | Status: DC

## 2018-12-03 MED ORDER — FLUTICASONE PROPIONATE 50 MCG/ACT NA SUSP: 2.0000 | Freq: Every day | NASAL | 3 refills | Status: DC

## 2018-12-03 MED ORDER — OLMESARTAN MEDOXOMIL 20 MG PO TABS: 20.0000 mg | ORAL_TABLET | Freq: Every day | ORAL | 0 refills | Status: DC

## 2018-12-03 MED ORDER — LINACLOTIDE 72 MCG PO CAPS: 72.0000 ug | ORAL_CAPSULE | Freq: Every day | ORAL | 1 refills | Status: DC

## 2018-12-03 MED ORDER — POTASSIUM CHLORIDE CRYS ER 10 MEQ PO TBCR: 10.0000 meq | EXTENDED_RELEASE_TABLET | Freq: Every day | ORAL | 0 refills | Status: DC

## 2018-12-03 NOTE — Telephone Encounter (Signed)
Rx sent to pt pharmacy

## 2018-12-08 ENCOUNTER — Encounter: Payer: Self-pay | Admitting: Registered Nurse

## 2018-12-08 ENCOUNTER — Other Ambulatory Visit: Payer: Self-pay

## 2018-12-08 ENCOUNTER — Other Ambulatory Visit: Payer: Self-pay | Admitting: Registered Nurse

## 2018-12-08 ENCOUNTER — Encounter: Payer: Medicare Other | Attending: Physical Medicine & Rehabilitation | Admitting: Registered Nurse

## 2018-12-08 VITALS — BP 107/67 | HR 108 | Temp 97.2°F | Wt 270.0 lb

## 2018-12-08 DIAGNOSIS — Z5181 Encounter for therapeutic drug level monitoring: Secondary | ICD-10-CM

## 2018-12-08 DIAGNOSIS — M5416 Radiculopathy, lumbar region: Secondary | ICD-10-CM

## 2018-12-08 DIAGNOSIS — M7061 Trochanteric bursitis, right hip: Secondary | ICD-10-CM | POA: Diagnosis not present

## 2018-12-08 DIAGNOSIS — M47816 Spondylosis without myelopathy or radiculopathy, lumbar region: Secondary | ICD-10-CM | POA: Diagnosis not present

## 2018-12-08 DIAGNOSIS — Z90710 Acquired absence of both cervix and uterus: Secondary | ICD-10-CM | POA: Insufficient documentation

## 2018-12-08 DIAGNOSIS — M069 Rheumatoid arthritis, unspecified: Secondary | ICD-10-CM | POA: Insufficient documentation

## 2018-12-08 DIAGNOSIS — E1142 Type 2 diabetes mellitus with diabetic polyneuropathy: Secondary | ICD-10-CM | POA: Insufficient documentation

## 2018-12-08 DIAGNOSIS — E1122 Type 2 diabetes mellitus with diabetic chronic kidney disease: Secondary | ICD-10-CM | POA: Diagnosis not present

## 2018-12-08 DIAGNOSIS — Z9889 Other specified postprocedural states: Secondary | ICD-10-CM | POA: Diagnosis not present

## 2018-12-08 DIAGNOSIS — M7062 Trochanteric bursitis, left hip: Secondary | ICD-10-CM

## 2018-12-08 DIAGNOSIS — M48062 Spinal stenosis, lumbar region with neurogenic claudication: Secondary | ICD-10-CM | POA: Diagnosis not present

## 2018-12-08 DIAGNOSIS — G894 Chronic pain syndrome: Secondary | ICD-10-CM | POA: Diagnosis not present

## 2018-12-08 DIAGNOSIS — Z79891 Long term (current) use of opiate analgesic: Secondary | ICD-10-CM

## 2018-12-08 DIAGNOSIS — M1712 Unilateral primary osteoarthritis, left knee: Secondary | ICD-10-CM | POA: Diagnosis not present

## 2018-12-08 DIAGNOSIS — F329 Major depressive disorder, single episode, unspecified: Secondary | ICD-10-CM | POA: Diagnosis not present

## 2018-12-08 DIAGNOSIS — M48061 Spinal stenosis, lumbar region without neurogenic claudication: Secondary | ICD-10-CM

## 2018-12-08 DIAGNOSIS — M1711 Unilateral primary osteoarthritis, right knee: Secondary | ICD-10-CM | POA: Diagnosis not present

## 2018-12-08 MED ORDER — MORPHINE SULFATE ER 15 MG PO TBCR: 15.0000 mg | EXTENDED_RELEASE_TABLET | Freq: Two times a day (BID) | ORAL | 0 refills | Status: DC

## 2018-12-08 NOTE — Patient Instructions (Signed)
To Call Office with your Morphine IR Tablet Count: [phone]

## 2018-12-08 NOTE — Progress Notes (Signed)
Subjective:    Patient ID: [name], female    DOB: [birth year], 78 y.o.   MRN: [id number]  HPI: [name] is a 78 y.o. female who returns for follow up appointment for chronic pain and medication refill. She states her pain is located in her upper- lower back radiating into her bilateral lower extremities, bilateral hip pain and bilateral knee pain. She rates her pain 7. Her  current exercise regime is walking and performing stretching exercises.  [name] is 60.00  MME.  UDS ordered today.  [name] forgot her medication today, narcotic policy was reviewed, she verbalizes understanding. Instructed to bring  her medication to all scheduled appointments, she verbalizes understanding.    Pain Inventory Average Pain 6 Pain Right Now 7 My pain is sharp and burning  In the last 24 hours, has pain interfered with the following? General activity 7 Relation with others 7 Enjoyment of life 7 What TIME of day is your pain at its worst? morning Sleep (in general) Fair  Pain is worse with: walking, bending, sitting and inactivity Pain improves with: rest, heat/ice and medication Relief from Meds: 6  Mobility use a walker how many minutes can you walk? 2 ability to climb steps?  no do you drive?  no use a wheelchair  Function retired I need assistance with the following:  meal prep, household duties and shopping  Neuro/Psych bladder control problems numbness tingling trouble walking dizziness  Prior Studies Any changes since last visit?  no  Physicians involved in your care Primary care . Psychiatrist . Rheumatologist .   Family History  Problem Relation Age of Onset  . Diabetes Mother   . Heart attack Mother   . Hypertension Father   . Lung cancer Father   . Diabetes Sister   . Diabetes Brother   . Hypertension Brother   . Heart disease Brother   . Heart attack Brother   . Kidney cancer Brother   . Uterine cancer  Daughter   . Breast cancer Sister   . Rheum arthritis Maternal Uncle   . Gout Brother   . Kidney failure Brother        x 5   Social History   Socioeconomic History  . Marital status: Married    Spouse name: Not on file  . Number of children: 6  . Years of education: college  . Highest education level: Not on file  Occupational History  . Occupation: retired Ship broker  . Financial resource strain: Not on file  . Food insecurity    Worry: Not on file    Inability: Not on file  . Transportation needs    Medical: Not on file    Non-medical: Not on file  Tobacco Use  . Smoking status: Never Smoker  . Smokeless tobacco: Never Used  Substance and Sexual Activity  . Alcohol use: No  . Drug use: No  . Sexual activity: Not Currently  Lifestyle  . Physical activity    Days per week: Not on file    Minutes per session: Not on file  . Stress: Not on file  Relationships  . Social Herbalist on phone: Not on file    Gets together: Not on file    Attends religious service: Not on file    Active member of club or organization: Not on file    Attends meetings of clubs or organizations: Not on file  Relationship status: Not on file  Other Topics Concern  . Not on file  Social History Narrative   Patient consumes 2-3 cups coffee per day.   Past Surgical History:  Procedure Laterality Date  . ABDOMINAL HYSTERECTOMY    . BRAIN SURGERY     Gamma knife 10/13. Needs repeat spring  '14  . CARDIAC CATHETERIZATION N/A 10/31/2014   Procedure: Right/Left Heart Cath and Coronary Angiography;  Surgeon: [name], MD;  Location: [facility];  Service: Cardiovascular;  Laterality: N/A;  . ESOPHAGOGASTRODUODENOSCOPY (EGD) WITH PROPOFOL N/A 09/14/2014   Procedure: ESOPHAGOGASTRODUODENOSCOPY (EGD) WITH PROPOFOL;  Surgeon: [name], MD;  Location: [facility];  Service: Endoscopy;  Laterality: N/A;  . INCISION AND DRAINAGE HIP Left 01/16/2017    Procedure: IRRIGATION AND DEBRIDEMENT LEFT HIP;  Surgeon: [name], MD;  Location: [facility];  Service: Orthopedics;  Laterality: Left;  . INTRAMEDULLARY (IM) NAIL INTERTROCHANTERIC Left 11/29/2016   Procedure: INTRAMEDULLARY (IM) NAIL INTERTROCHANTRIC;  Surgeon: [name], MD;  Location: [facility];  Service: Orthopedics;  Laterality: Left;  . OVARY SURGERY    . SHOULDER SURGERY Left   . TONSILLECTOMY  age 12  . VIDEO BRONCHOSCOPY Bilateral 05/31/2013   Procedure: VIDEO BRONCHOSCOPY WITHOUT FLUORO;  Surgeon: Brand Males, MD;  Location: [facility];  Service: Cardiopulmonary;  Laterality: Bilateral;  . video bronscoscopy  2000   lung   Past Medical History:  Diagnosis Date  . Acute on chronic diastolic (congestive) heart failure ([place])   . Anemia    iron deficiency anemia - secondary to blood loss ( chronic)   . Arthritis    endstage changes bilateral knees/bilateral ankles.   . Asthma   . Carotid artery occlusion   . Chronic fatigue   . Chronic kidney disease   . Closed left hip fracture ([place])   . Clotting disorder ([facility])    pt denies this  . Contusion of left knee    due to fall 1/14.  [name] COPD (chronic obstructive pulmonary disease) ([facility])    pulmonary fibrosis  . Depression, reactive   . Diabetes mellitus    type II   . Diastolic dysfunction   . Difficulty in walking   . Family history of heart disease   . Generalized muscle weakness   . Gout   . High cholesterol   . History of falling   . Hypertension   . Hypothyroidism   . Interstitial lung disease ([place])   . 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    . Morbid obesity ([place])   . Morbid obesity ([place])   . Neuromuscular disorder ([place])    diabetic neuropathy   . Normal coronary arteries    cardiac catheterization performed  10/31/14  . RA (rheumatoid arthritis) ([place])    has been off methotreaxte since 10/13.   [name] Rheumatoid arthritis ([place])   . Shortness of breath   . Spinal stenosis of lumbar region   . Thyroid disease   . Unspecified lack of coordination   . URI (upper respiratory infection)    BP 107/67   Pulse (!) 108   Temp (!) 97.2 F (36.2 C)   Wt 270 lb (122.5 kg)   SpO2 90%   BMI 46.35 kg/m   Opioid Risk Score:   Fall Risk Score:  `1  Depression screen [facility] 2/9  Depression screen [facility] 2/9 06/11/2018 05/26/2018 12/09/2017 10/22/2017 09/22/2017  07/28/2017 06/30/2017  Decreased Interest _0 0 0 0 0  Down, Depressed, Hopeless _1 0 0 0 0  PHQ - 2 Score _2 0 0 0 0  Altered sleeping 2 2 - - - - -  Tired, decreased energy 2 3 - - - - -  Change in appetite 2 1 - - - - -  Feeling bad or failure about yourself  2 2 - - - - -  Trouble concentrating 2 0 - - - - -  Moving slowly or fidgety/restless 0 2 - - - - -  Suicidal thoughts 0 0 - - - - -  PHQ-9 Score 12 13 - - - - -  Difficult doing work/chores - Somewhat difficult - - - - -  Some recent data might be hidden    Review of Systems  Constitutional: Negative.   HENT: Negative.   Eyes: Negative.   Respiratory: Negative.   Cardiovascular: Negative.   Gastrointestinal: Positive for constipation.  Endocrine: Negative.   Genitourinary: Negative.   Musculoskeletal: Negative.   Skin: Positive for rash.  Allergic/Immunologic: Negative.   Neurological: Negative.   Hematological: Negative.   Psychiatric/Behavioral: Negative.   All other systems reviewed and are negative.      Objective:   Physical Exam Vitals signs and nursing note reviewed.  Constitutional:      Appearance: Normal appearance. She is obese.  Neck:     Musculoskeletal: Normal range of motion and neck supple.  Cardiovascular:     Rate and Rhythm: Normal rate and regular rhythm.     Pulses: Normal pulses.     Heart sounds: Normal heart sounds.  Pulmonary:     Effort: Pulmonary effort is normal.     Breath sounds: Normal breath sounds.  Musculoskeletal:      Right lower leg: Edema present.     Left lower leg: Edema present.     Comments: Normal Muscle Bulk and Muscle Testing Reveals:  Upper Extremities: Full ROM and Muscle Strength 5/5 Thoracic and  Lumbar Hypersensitivity Lower Extremities: Decreased ROM and Muscle Strength 4/5 Bilateral Lower Extremities Flexion Produces Pain into her Lower Extremities Arrived in wheelchair   Skin:    General: Skin is warm and dry.  Neurological:     Mental Status: She is alert and oriented to person, place, and time.  Psychiatric:        Mood and Affect: Mood normal.        Behavior: Behavior normal.           Assessment & Plan:  1. Lumbar spinal stenosis with neurogenic claudication. Associated facet arthropathy/ Lumbar Radiculitis: Continuecurrent medication regime withGabapentinand HEP as tolerated. 12/08/2018 Refilled: MS Contin 15 mg one tablet every 12 hours #60. Second script given for the following monthand continueMSIR 15 one tablet twice a dayas needed  For break through pain ( 6 am and 1:00 pm)#60.[name] We will continue the opioid monitoring program, this consists of regular clinic visits, examinations, urine drug screen, pill counts as well as use of [place] Controlled Substance reporting System. 2. Depression: Continuecurrent medication Regime:Cymbalta. 12/08/2018 3. Diabetes mellitus type 2 with polyneuropathy: Continuecurrent medication regime:Gabapentin.12/08/2018 4. Rheumatoid arthritis and osteoarthritis. Continuecurrent medication Regime.Voltaren Gel. Rheumatology Following. 12/08/2018 5. Interstitial lung disease: Pulmonology Following.12/08/2018. 6. Bilateral Osteoarthritis Knee's: Continuecurrent medication regimen.Voltaren Gel.12/08/2018. 7. Muscle Spasm: Continuecurrent medication regime: Flexeril.12/08/2018 8.BilateralGreater Trochanteric Tenderness:R>L.No complaints today. Continuecurrent treatmentwith Ice/Heat Therapy.12/08/2018. 9.  Polyarthralgia: Rheumatology Following: Continue to monitor.12/08/2018 10. Morbid Obesitity:  Continue Healthy Diet Regime and HEP. Continue to monitor.12/08/2018   20 minutes of face to face patient care time was spent during this visit. All questions were encouraged and answered.   F/U in 1 month

## 2018-12-15 DIAGNOSIS — J479 Bronchiectasis, uncomplicated: Secondary | ICD-10-CM | POA: Diagnosis not present

## 2018-12-15 DIAGNOSIS — I272 Pulmonary hypertension, unspecified: Secondary | ICD-10-CM | POA: Diagnosis not present

## 2018-12-15 DIAGNOSIS — R0789 Other chest pain: Secondary | ICD-10-CM | POA: Diagnosis not present

## 2018-12-15 LAB — TOXASSURE SELECT,+ANTIDEPR,UR

## 2018-12-20 ENCOUNTER — Telehealth: Payer: Self-pay | Admitting: *Deleted

## 2018-12-20 NOTE — Telephone Encounter (Signed)
Urine drug screen for this encounter is consistent for prescribed medication 

## 2018-12-22 DIAGNOSIS — M1A00X0 Idiopathic chronic gout, unspecified site, without tophus (tophi): Secondary | ICD-10-CM | POA: Diagnosis not present

## 2018-12-22 DIAGNOSIS — I82409 Acute embolism and thrombosis of unspecified deep veins of unspecified lower extremity: Secondary | ICD-10-CM | POA: Diagnosis not present

## 2018-12-22 DIAGNOSIS — Z7952 Long term (current) use of systemic steroids: Secondary | ICD-10-CM | POA: Diagnosis not present

## 2018-12-22 DIAGNOSIS — Z7901 Long term (current) use of anticoagulants: Secondary | ICD-10-CM | POA: Diagnosis not present

## 2018-12-22 DIAGNOSIS — I824Z3 Acute embolism and thrombosis of unspecified deep veins of distal lower extremity, bilateral: Secondary | ICD-10-CM | POA: Diagnosis not present

## 2018-12-22 DIAGNOSIS — J479 Bronchiectasis, uncomplicated: Secondary | ICD-10-CM | POA: Diagnosis not present

## 2018-12-22 DIAGNOSIS — M0579 Rheumatoid arthritis with rheumatoid factor of multiple sites without organ or systems involvement: Secondary | ICD-10-CM | POA: Diagnosis not present

## 2018-12-22 DIAGNOSIS — M1A9XX0 Chronic gout, unspecified, without tophus (tophi): Secondary | ICD-10-CM | POA: Diagnosis not present

## 2018-12-22 DIAGNOSIS — J8410 Pulmonary fibrosis, unspecified: Secondary | ICD-10-CM | POA: Diagnosis not present

## 2018-12-22 DIAGNOSIS — Z79899 Other long term (current) drug therapy: Secondary | ICD-10-CM | POA: Diagnosis not present

## 2018-12-27 ENCOUNTER — Telehealth: Payer: Self-pay

## 2018-12-27 NOTE — Telephone Encounter (Signed)
Telephone call to patient to schedule home visit with patient. Patient in agreement with visit on 12-27-18 at 12:00 PM.

## 2018-12-29 ENCOUNTER — Other Ambulatory Visit: Payer: Self-pay

## 2018-12-29 ENCOUNTER — Other Ambulatory Visit: Payer: Medicare Other

## 2018-12-29 DIAGNOSIS — Z515 Encounter for palliative care: Secondary | ICD-10-CM

## 2018-12-29 NOTE — Progress Notes (Signed)
PATIENT NAME: [name] DOB: [birth year] MRN: [id number]  PRIMARY CARE PROVIDER: [name], MD  RESPONSIBLE PARTY:  Acct ID - Guarantor Home Phone Work Phone Relationship Acct Type  [id number] [name], [name] Self P/F     [address], [name], [place] [address]    PLAN OF CARE and INTERVENTIONS:               1.  GOALS OF CARE/ ADVANCE CARE PLANNING:  Remain in her home with her husband.               2.  PATIENT/CAREGIVER EDUCATION:  Education on fall precautions, education on s/s of infection, medication review               3.  DISEASE STATSW and RN made joint home visit to see patient. Patient walking slowly with her new rollator walker. Patient's son and grandchildren in home with patient. Patient's husband [name] arrived shortly after nurse and Education officer, museum. Patient reports she had MD appointment at [facility] at week. Patient reports her labs look good and patient is pleased with results of appointment.  Patient reports she had stress test and "scan of heart" and MD informed her cardiac tests showed no changes since 2017.  Patient reports her arthritis is worse and patient to make appointment to see rheumatoid MD.  Patient continues to have pain in her legs. Patient currently rates pain at 4.   Patient continues pain medications for chronic pain (Morphine Sulfate long and short acting. Patient's current weight 270 pounds. Patient has shortness of breath on exertion and wears oxygen at night at 2 LPM. Patient uses O2 during the day as needed. Patient continues to take diuretics. Patient also uses Voltaren gel on her legs 3 times a day however patient reports gel only lasts for 1 hour. Patient reports she has been sleeping well at night. Patient has fine crackles throughout  lung fields. Patient has a productive cough of light yellow to clear sputum. Patient feels like she is doing fairly well overall. Nurse called and completed covid-19 screeding prior to social  worker and nurses arrival. Patient request telehealth visit be made in September. Patient has not suffered any recent falls. Patient ad family encouraged to contact palliative care with questions or concerns.     HISTORY OF PRESENT ILLNESS:    CODE STATUS: DNR  ADVANCED DIRECTIVES: N MOST FORM: Y PPS: 50%   PHYSICAL EXAM:   VITALS: Today's Vitals   12/29/18 1220  BP: 104/68  Pulse: 94  Resp: 20  SpO2: 92%  Weight: 270 lb (122.5 kg)  PainSc: 4   PainLoc: Mouth    LUNGS: coarse sounds heard, scattered rales bilaterally CARDIAC: Cor RRR  EXTREMITIES: 2+ edema SKIN: Skin color, texture, turgor normal. No rashes or lesions  NEURO: positive for gait problems and weakness       Nilda Simmer, RN

## 2018-12-29 NOTE — Progress Notes (Signed)
COMMUNITY PALLIATIVE CARE SW NOTE  PATIENT NAME: [name] DOB: Jul 12, 1940 MRN: [id number]  PRIMARY CARE PROVIDER: [name], MD  RESPONSIBLE PARTY:  Acct ID - Guarantor Home Phone Work Phone Relationship Acct Type  [id number] JUNI, [id number] Self P/F     [address], [name], [place] [address]     PLAN OF CARE and INTERVENTIONS:             1. GOALS OF CARE/ ADVANCE CARE PLANNING:  Patient is DNR, form is in the home. Patient has a MOST form. Patient goal is to remain at home.  2. SOCIAL/EMOTIONAL/SPIRITUAL ASSESSMENT/ INTERVENTIONS:  SW and RN met with patient in her home. Patient was welcoming and talkative with team. 3. PATIENT/CAREGIVER EDUCATION/ COPING:  Patient was cheerful, hopeful. Patient has strong family support. Patient feels that she is doing "pretty good" and wants to continue improving. Patient said she completed her stress test and had labs done and that she received a good report. Patient noted that she continues to struggle with swelling and pain in her legs, and that the physician was suggesting a new medication that patient will start next week. Patient is sleeping well, eating "fair". Patient talked about her home, her family and the books that she is reading currently. Patient also mentioned the importance of her faith and her study of the Bible. 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 4. PERSONAL EMERGENCY PLAN:  Patient/familywill call 9-1-1 for emergencies.Due to COVID-19 crisis, patient verbalized thatshe isbeing cautious and only goes to medical appointments. 5. COMMUNITY RESOURCES COORDINATION/ HEALTH CARE NAVIGATION:  No needs, patient is able to coordinate care. 6. FINANCIAL/LEGAL CONCERNS/INTERVENTIONS:  None.     SOCIAL HX:  Social History   Tobacco Use  . Smoking status: Never Smoker  . Smokeless tobacco: Never Used  Substance Use Topics   . Alcohol use: No    CODE STATUS:   Code Status: Prior (DNR) ADVANCED DIRECTIVES: Y MOST FORM COMPLETE:  Yes. HOSPICE EDUCATION PROVIDED: None.  PPS: Patient is independent with ADLs. Patient is using wheelchair when she has increased pain and swelling in her legs.   I spent 60 minutes with patient/family, from 12:00-1:00p providing education, support and consultation.    Margaretmary Lombard, LCSW

## 2019-01-05 ENCOUNTER — Telehealth: Payer: Self-pay

## 2019-01-05 NOTE — Telephone Encounter (Signed)
Patient called with information to give [name] in regards to her pain medication:  Morphine IR = 30 Morphine ER = 40

## 2019-01-14 DIAGNOSIS — M0579 Rheumatoid arthritis with rheumatoid factor of multiple sites without organ or systems involvement: Secondary | ICD-10-CM | POA: Diagnosis not present

## 2019-01-17 ENCOUNTER — Other Ambulatory Visit: Payer: Self-pay | Admitting: Family Medicine

## 2019-01-17 MED ORDER — FUROSEMIDE 20 MG PO TABS: 60.0000 mg | ORAL_TABLET | Freq: Every day | ORAL | 1 refills | Status: DC

## 2019-01-17 NOTE — Telephone Encounter (Signed)
Copied from [place] [phone]. Topic: Quick Communication - Rx Refill/Question >> Jan 17, 2019 11:50 AM Yvette Rack wrote: Medication: furosemide (LASIX) 20 MG tablet  Has the patient contacted their pharmacy? yes   Preferred Pharmacy (with phone number or street name): [facility] - [place], [place] - [address] (Phone)  [phone] (Fax)  Agent: Please be advised that RX refills may take up to 3 business days. We ask that you follow-up with your pharmacy.

## 2019-01-18 ENCOUNTER — Telehealth: Payer: Self-pay

## 2019-01-18 NOTE — Telephone Encounter (Signed)
Telephone call to patient to schedule TELEHEALTH visit with patient. Patient in agreement with TELEHEALTH visit on 01-20-19 at 11:00 AM.

## 2019-01-19 DIAGNOSIS — M1A00X0 Idiopathic chronic gout, unspecified site, without tophus (tophi): Secondary | ICD-10-CM | POA: Diagnosis not present

## 2019-01-19 DIAGNOSIS — Z79899 Other long term (current) drug therapy: Secondary | ICD-10-CM | POA: Diagnosis not present

## 2019-01-19 DIAGNOSIS — M0579 Rheumatoid arthritis with rheumatoid factor of multiple sites without organ or systems involvement: Secondary | ICD-10-CM | POA: Diagnosis not present

## 2019-01-20 ENCOUNTER — Other Ambulatory Visit: Payer: Medicare Other

## 2019-01-20 ENCOUNTER — Other Ambulatory Visit: Payer: Self-pay

## 2019-01-20 DIAGNOSIS — Z515 Encounter for palliative care: Secondary | ICD-10-CM

## 2019-01-20 NOTE — Progress Notes (Signed)
COMMUNITY PALLIATIVE CARE SW NOTE  PATIENT NAME: [name] DOB: 1940-07-24 MRN: [id number]  PRIMARY CARE PROVIDER: [name], MD  RESPONSIBLE PARTY:  Acct ID - Guarantor Home Phone Work Phone Relationship Acct Type  [id number] [name], [name] Self P/F     [address], [name], [place] [address]     PLAN OF CARE and INTERVENTIONS:             1. GOALS OF CARE/ ADVANCE CARE PLANNING:  Patient is a DNR, form is in the home. Patient has a MOST form completed. Patient goal is to remain at home with family. 2. SOCIAL/EMOTIONAL/SPIRITUAL ASSESSMENT/ INTERVENTIONS:  SW and RN completed TELEHEALTH visit with patient. Patient reports feeling "tired". Patient notes that she has been experiencing a gout flare up. Patient said she has had less shooting pain in her left leg. Patient reports appetite continues to vary, but most of the time she does not feel hungry. Patient said she is sleeping more but feels tired. Patient said that she is using oxygen at night and sometimes during the day. Patient and her husband went to get pedicures and manicures yesterday, and she enjoyed this outing. 3. PATIENT/CAREGIVER EDUCATION/ COPING:  Patient was cheerful, positive during visit. Patient notes improved mood overall and appreciates palliative care team. Family is involved with patient care and very supportive. 4. PERSONAL EMERGENCY PLAN:  Patient/familywill call 9-1-1 for emergencies. 5. COMMUNITY RESOURCES COORDINATION/ HEALTH CARE NAVIGATION:  Patient coordinates her care. PCP appointment visit on September 16th. No concerns. 6. FINANCIAL/LEGAL CONCERNS/INTERVENTIONS:  None at this time.     SOCIAL HX:  Social History   Tobacco Use  . Smoking status: Never Smoker  . Smokeless tobacco: Never Used  Substance Use Topics  . Alcohol use: No    CODE STATUS:   Code Status: Prior (DNR) ADVANCED DIRECTIVES: Y MOST FORM COMPLETE:  Yes. HOSPICE EDUCATION PROVIDED:  None.  PPS: Patient is independent with ADLs.Patient ambulates with walker.  Due to the COVID-19, this visit was done via telephone from my office and it was initiated and consent by this patient and/or family. This was a scheduled visit.   I spent 30 minutes with patient/family, from 11:00-11:30a providing education, support and consultation.  Due to patient's stability, patient will no longer be followed by [facility] team at this time. Patient will remain with [facility] team and be followed by NP. Patient agreeable to plan, no questions/concerns. Patient confirmed that she will contact [facility] team if anything changes.    Joan Lombard, LCSW

## 2019-01-20 NOTE — Progress Notes (Signed)
PATIENT NAME: [name] DOB: 1941/04/15 MRN: [id number]  PRIMARY CARE PROVIDER: [name], MD  RESPONSIBLE PARTY:  Acct ID - Guarantor Home Phone Work Phone Relationship Acct Type  [id number] MERCADES, [phone] Self P/F     [address], [name], [place] [address]    PLAN OF CARE and INTERVENTIONS:               1.  GOALS OF CARE/ ADVANCE CARE PLANNING:  Reamin at home with husband and family.               2.  PATIENT/CAREGIVER EDUCATION:  Education on fall precautions, education on s/s of infection.               3.  DISEASE STATUS: Patient is a 78 year old patient with HTN, COPD, Heart failure, Pulmonary Fibrosis, Rheumatoid arthritis, Osteoarthritis, Renal insufficiency, GOUT, Depression, Hypothyroidism, Chronic pain     HISTORY OF PRESENT ILLNESS:   SW and RN met with patient for Telehealth visit. Patient reports she has felt really tired the past two days. Patient reports she feels better in the afternoons. Patient states she was able to go out yesterday for a pedicure and manicure. Patient states she has started taking her Colchicine for gout flare up. Patient states she is having decreased pain and decreased swelling in her legs. Patient reports she continues to have shortness of breath and has been using her oxygen more during the day. Patient states she called primary MD to schedule visit for flu shot and to have her hemoglobin A1c drawn.  Patient reports her weight this morning was 266 pounds. Patient reports she had lab work done to check liver enzymes due to medications she is taking. Patient reports shooting pain that she suffers with has improved but does have periodic pain in left leg. Patient continues to take pain medications. Patient continues to ambulate with her walker but states she doesn't get very far before giving out. Patient reports her appetite is not good. Patient states she makes herself eat but has no appetite for food. Patient  reports she has been sleeping fairly well at night. Patient in agreement with being transferred to nurse practitioner for palliative follow.  Patient in agreement with NP following her.  Patient informed [facility] team would return if she declines.  Support provided to patient. Patient encouraged to call palliative care with questions or concerns.   CODE STATUS: DNR  ADVANCED DIRECTIVES: N MOST FORM: N PPS: 50%   PHYSICAL EXAM:   VITALS: TELEHEALTH visit no vital signs taken  LUNGS: Shortness of breath on exertion, productive cough of white sputum CARDIAC:  EXTREMITIES: 2+ edema SKIN: Skin color, texture, turgor normal. No rashes or lesions  NEURO: positive for weakness       Nilda Simmer, RN

## 2019-02-02 ENCOUNTER — Other Ambulatory Visit: Payer: Self-pay

## 2019-02-02 ENCOUNTER — Encounter: Payer: Self-pay | Admitting: Family Medicine

## 2019-02-02 ENCOUNTER — Ambulatory Visit (INDEPENDENT_AMBULATORY_CARE_PROVIDER_SITE_OTHER): Payer: Medicare Other | Admitting: Family Medicine

## 2019-02-02 VITALS — BP 138/98 | HR 96 | Temp 95.8°F | Wt 270.0 lb

## 2019-02-02 DIAGNOSIS — Z86711 Personal history of pulmonary embolism: Secondary | ICD-10-CM

## 2019-02-02 DIAGNOSIS — Z23 Encounter for immunization: Secondary | ICD-10-CM

## 2019-02-02 DIAGNOSIS — M1A09X0 Idiopathic chronic gout, multiple sites, without tophus (tophi): Secondary | ICD-10-CM | POA: Diagnosis not present

## 2019-02-02 DIAGNOSIS — E119 Type 2 diabetes mellitus without complications: Secondary | ICD-10-CM

## 2019-02-02 DIAGNOSIS — M05742 Rheumatoid arthritis with rheumatoid factor of left hand without organ or systems involvement: Secondary | ICD-10-CM

## 2019-02-02 DIAGNOSIS — M05741 Rheumatoid arthritis with rheumatoid factor of right hand without organ or systems involvement: Secondary | ICD-10-CM | POA: Diagnosis not present

## 2019-02-02 DIAGNOSIS — M170 Bilateral primary osteoarthritis of knee: Secondary | ICD-10-CM | POA: Diagnosis not present

## 2019-02-02 DIAGNOSIS — I10 Essential (primary) hypertension: Secondary | ICD-10-CM

## 2019-02-02 LAB — POCT GLYCOSYLATED HEMOGLOBIN (HGB A1C): Hemoglobin A1C: 6.5 % — AB (ref 4.0–5.6)

## 2019-02-02 MED ORDER — OLMESARTAN MEDOXOMIL 40 MG PO TABS: 40.0000 mg | ORAL_TABLET | Freq: Every day | ORAL | 4 refills | Status: DC

## 2019-02-02 NOTE — Progress Notes (Signed)
Subjective:    Patient ID: [name], female    DOB: 19-Feb-1941, 78 y.o.   MRN: [id number]  No chief complaint on file.   HPI Patient was seen today for f/u on chronic conditions.  DM II: -states fsbs between 120s-130s -Ozempic 0.5 mg wkly -[name] cooks for her  Gout: -notes really bad flare a few wks ago in multiple joints -started back on colchicine -eating more mushrooms since her son has been cooking.  Also had 2 hot dogs recently -notes pain in joints improving, but not back to normal. -denies eating organ meat, seafood, or drinking EtOH  OA: -b/l knees -states unable to have surgery 2/2 chronic med conditions mainly her lungs -s/p 2nd opinions -states injections no longer help -on MS contin 15 mg q 12 hr and morphine 15 mg BID for breakthrough pain.  Followed by pain management.  HTN: -endorses elevation at home 150s/90s -taking lasix 60 mg daily, toprol XL 1/2 of a 100 mg tab, and olmesartan 20 mg -on Kdur 10 mEq  RA: -endorses having recent labs done at [facility] as meds changed. -on low dose prednisone daily  H/o PE: -PT had PE and DVT Jan 2020 -on Eliquis 5 mg daily -pt requesting note for dental cleaning and fillings.  States had a form, but forgot to bring it with her. -has not had recent f/u with Cardiology.  Past Medical History:  Diagnosis Date  . Acute on chronic diastolic (congestive) heart failure ([place])   . Anemia    iron deficiency anemia - secondary to blood loss ( chronic)   . Arthritis    endstage changes bilateral knees/bilateral ankles.   . Asthma   . Carotid artery occlusion   . Chronic fatigue   . Chronic kidney disease   . Closed left hip fracture ([place])   . Clotting disorder ([place])    pt denies this  . Contusion of left knee    due to fall 1/14.  [name] COPD (chronic obstructive pulmonary disease) (HCC)    pulmonary fibrosis  . Depression, reactive   . Diabetes mellitus    type II   . Diastolic dysfunction   . Difficulty in  walking   . Family history of heart disease   . Generalized muscle weakness   . Gout   . High cholesterol   . History of falling   . Hypertension   . Hypothyroidism   . Interstitial lung disease ([place])   . 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    . Morbid obesity ([place])   . Morbid obesity ([place])   . Neuromuscular disorder ([place])    diabetic neuropathy   . Normal coronary arteries    cardiac catheterization performed  10/31/14  . RA (rheumatoid arthritis) ([place])    has been off methotreaxte since 10/13.  [name] Rheumatoid arthritis ([place])   . Shortness of breath   . Spinal stenosis of lumbar region   . Thyroid disease   . Unspecified lack of coordination   . URI (upper respiratory infection)     Allergies  Allergen Reactions  . Codeine Swelling and Other (See Comments)    Facial swelling Chest pain and swelling in legs  Facial swelling  . Infliximab Anaphylaxis    "sent me into shock" "sent me into shock"  . Lisinopril Swelling    Face and neck swelling Face and neck swelling    ROS General:  Denies fever, chills, night sweats, changes in weight, changes in appetite HEENT: Denies headaches, ear pain, changes in vision, rhinorrhea, sore throat CV: Denies CP, palpitations, SOB, orthopnea Pulm: Denies SOB, cough, wheezing GI: Denies abdominal pain, nausea, vomiting, diarrhea, constipation GU: Denies dysuria, hematuria, frequency, vaginal discharge Msk: Denies muscle cramps  +joint pains Neuro: Denies weakness, numbness, tingling Skin: Denies rashes, bruising Psych: Denies depression, anxiety, hallucinations    Objective:    Blood pressure (!) 138/98, pulse 96, temperature (!) 95.8 F (35.4 C), temperature source Temporal, weight 270 lb (122.5 kg), SpO2 93 %.   Gen. Pleasant, well-nourished, in no distress, normal affect   HEENT: [place]/AT, face symmetric, wearing glasses, conjunctiva  clear, no scleral icterus, PERRLA, EOMI, nares patent without drainage.  TMs normal b/l. Lungs: no accessory muscle use, CTAB, no wheezes or rales Cardiovascular: RRR, no m/r/g, no peripheral edema Musculoskeletal: No deformities, no cyanosis or clubbing, normal tone Neuro:  A&Ox3, CN II-XII intact, normal gait Skin:  Warm, no lesions/ rash   Wt Readings from Last 3 Encounters:  02/02/19 270 lb (122.5 kg)  01/20/19 266 lb (120.7 kg)  12/29/18 270 lb (122.5 kg)    Lab Results  Component Value Date   WBC 8.4 06/05/2018   HGB 11.0 (L) 06/05/2018   HCT 35.9 (L) 06/05/2018   PLT 160 06/05/2018   GLUCOSE 124 (H) 06/11/2018   CHOL 150 05/26/2018   TRIG 111 05/26/2018   HDL 65 05/26/2018   LDLCALC 63 05/26/2018   ALT 54 (H) 05/26/2018   AST 30 05/26/2018   NA 143 06/11/2018   K 4.6 06/11/2018   CL 99 06/11/2018   CREATININE 1.22 (H) 06/11/2018   BUN 17 06/11/2018   CO2 28 06/11/2018   TSH 3.510 05/26/2018   INR 1.05 11/29/2016   HGBA1C 6.4 (H) 05/26/2018    Assessment/Plan:  Chronic gout of multiple sites, unspecified cause -pt advised on foods that can cause gout flares.  Given a list.  -continue current meds.    Essential hypertension  -elevated -continue lifestyle modifications -will increase benicar from 20 mg to 40 mg.  Continue Toprol Xl 100 mg (take 1/2 a tab daily) as this is covered by her insurance.  Pt would have to pay out of pocket for 50 mg tabs. -pt to check bp daily and keep a log  - Plan: olmesartan (BENICAR) 40 MG tablet  Need for influenza vaccination  - Plan: Flu Vaccine QUAD High Dose(Fluad)  Type 2 diabetes mellitus without complication, without long-term current use of insulin (HCC)  -hgb A1C 6.5% this visit -continue Ozempic wkly -continue lifestyle modifications - Plan: POCT glycosylated hemoglobin (Hb A1C)  Primary osteoarthritis of both knees -continue current pain regimen per pain management: MS Contin 15 mg BID and Morphine IR 15 mg  BID prn breakthrough pain. -consider f/u with Ortho for any further recs  RA in hands with positive RF -continue current meds -continue f/u with [facility]  H/o PE -continue Eliquis -advised to f/u with Cards -discussed possibility of d/c'ing Eliquis as PE/DVT episode was pt's first.  F/u prn in the next few months  Grier Mitts, MD

## 2019-02-02 NOTE — Patient Instructions (Signed)
Gout  Gout is a condition that causes painful swelling of the joints. Gout is a type of inflammation of the joints (arthritis). This condition is caused by having too much uric acid in the body. Uric acid is a chemical that forms when the body breaks down substances called purines. Purines are important for building body proteins. When the body has too much uric acid, sharp crystals can form and build up inside the joints. This causes pain and swelling. Gout attacks can happen quickly and may be very painful (acute gout). Over time, the attacks can affect more joints and become more frequent (chronic gout). Gout can also cause uric acid to build up under the skin and inside the kidneys. What are the causes? This condition is caused by too much uric acid in your blood. This can happen because:  Your kidneys do not remove enough uric acid from your blood. This is the most common cause.  Your body makes too much uric acid. This can happen with some cancers and cancer treatments. It can also occur if your body is breaking down too many red blood cells (hemolytic anemia).  You eat too many foods that are high in purines. These foods include organ meats and some seafood. Alcohol, especially beer, is also high in purines. A gout attack may be triggered by trauma or stress. What increases the risk? You are more likely to develop this condition if you:  Have a family history of gout.  Are female and middle-aged.  Are female and have gone through menopause.  Are obese.  Frequently drink alcohol, especially beer.  Are dehydrated.  Lose weight too quickly.  Have an organ transplant.  Have lead poisoning.  Take certain medicines, including aspirin, cyclosporine, diuretics, levodopa, and niacin.  Have kidney disease.  Have a skin condition called psoriasis. What are the signs or symptoms? An attack of acute gout happens quickly. It usually occurs in just one joint. The most common place is  the big toe. Attacks often start at night. Other joints that may be affected include joints of the feet, ankle, knee, fingers, wrist, or elbow. Symptoms of this condition may include:  Severe pain.  Warmth.  Swelling.  Stiffness.  Tenderness. The affected joint may be very painful to touch.  Shiny, red, or purple skin.  Chills and fever. Chronic gout may cause symptoms more frequently. More joints may be involved. You may also have white or yellow lumps (tophi) on your hands or feet or in other areas near your joints. How is this diagnosed? This condition is diagnosed based on your symptoms, medical history, and physical exam. You may have tests, such as:  Blood tests to measure uric acid levels.  Removal of joint fluid with a thin needle (aspiration) to look for uric acid crystals.  X-rays to look for joint damage. How is this treated? Treatment for this condition has two phases: treating an acute attack and preventing future attacks. Acute gout treatment may include medicines to reduce pain and swelling, including:  NSAIDs.  Steroids. These are strong anti-inflammatory medicines that can be taken by mouth (orally) or injected into a joint.  Colchicine. This medicine relieves pain and swelling when it is taken soon after an attack. It can be given by mouth or through an IV. Preventive treatment may include:  Daily use of smaller doses of NSAIDs or colchicine.  Use of a medicine that reduces uric acid levels in your blood.  Changes to your diet. You may   need to see a dietitian about what to eat and drink to prevent gout. Follow these instructions at home: During a gout attack   If directed, put ice on the affected area: ? Put ice in a plastic bag. ? Place a towel between your skin and the bag. ? Leave the ice on for 20 minutes, 2-3 times a day.  Raise (elevate) the affected joint above the level of your heart as often as possible.  Rest the joint as much as possible.  If the affected joint is in your leg, you may be given crutches to use.  Follow instructions from your health care provider about eating or drinking restrictions. Avoiding future gout attacks  Follow a low-purine diet as told by your dietitian or health care provider. Avoid foods and drinks that are high in purines, including liver, kidney, anchovies, asparagus, herring, mushrooms, mussels, and beer.  Maintain a healthy weight or lose weight if you are overweight. If you want to lose weight, talk with your health care provider. It is important that you do not lose weight too quickly.  Start or maintain an exercise program as told by your health care provider. Eating and drinking  Drink enough fluids to keep your urine pale yellow.  If you drink alcohol: ? Limit how much you use to:  0-1 drink a day for women.  0-2 drinks a day for men. ? Be aware of how much alcohol is in your drink. In the [place] one drink equals one 12 oz bottle of beer (355 mL) one 5 oz glass of wine (148 mL), or one 1 oz glass of hard liquor (44 mL). General instructions  Take over-the-counter and prescription medicines only as told by your health care provider.  Do not drive or use heavy machinery while taking prescription pain medicine.  Return to your normal activities as told by your health care provider. Ask your health care provider what activities are safe for you.  Keep all follow-up visits as told by your health care provider. This is important. Contact a health care provider if you have:  Another gout attack.  Continuing symptoms of a gout attack after 10 days of treatment.  Side effects from your medicines.  Chills or a fever.  Burning pain when you urinate.  Pain in your lower back or belly. Get help right away if you:  Have severe or uncontrolled pain.  Cannot urinate. Summary  Gout is painful swelling of the joints caused by inflammation.  The most common site of pain is the big  toe, but it can affect other joints in the body.  Medicines and dietary changes can help to prevent and treat gout attacks. This information is not intended to replace advice given to you by your health care provider. Make sure you discuss any questions you have with your health care provider. Document Released: 05/02/2000 Document Revised: 11/25/2017 Document Reviewed: 11/25/2017 Elsevier Patient Education  [place] A low-purine eating plan involves making food choices to limit your intake of purine. Purine is a kind of uric acid. Too much uric acid in your blood can cause certain conditions, such as gout and kidney stones. Eating a low-purine diet can help control these conditions. What are tips for following this plan? Reading food labels   Avoid foods with saturated or Trans fat.  Check the ingredient list of grains-based foods, such as bread and cereal, to make sure that they contain whole grains.  Check the  ingredient list of sauces or soups to make sure they do not contain meat or fish.  When choosing soft drinks, check the ingredient list to make sure they do not contain high-fructose corn syrup. Shopping  Buy plenty of fresh fruits and vegetables.  Avoid buying canned or fresh fish.  Buy dairy products labeled as low-fat or nonfat.  Avoid buying premade or processed foods. These foods are often high in fat, salt (sodium), and added sugar. Cooking  Use olive oil instead of butter when cooking. Oils like olive oil, canola oil, and sunflower oil contain healthy fats. Meal planning  Learn which foods do or do not affect you. If you find out that a food tends to cause your gout symptoms to flare up, avoid eating that food. You can enjoy foods that do not cause problems. If you have any questions about a food item, talk with your dietitian or health care provider.  Limit foods high in fat, especially saturated fat. Fat makes it harder for your  body to get rid of uric acid.  Choose foods that are lower in fat and are lean sources of protein. General guidelines  Limit alcohol intake to no more than 1 drink a day for nonpregnant women and 2 drinks a day for men. One drink equals 12 oz of beer, 5 oz of wine, or 1 oz of hard liquor. Alcohol can affect the way your body gets rid of uric acid.  Drink plenty of water to keep your urine clear or pale yellow. Fluids can help remove uric acid from your body.  If directed by your health care provider, take a vitamin C supplement.  Work with your health care provider and dietitian to develop a plan to achieve or maintain a healthy weight. Losing weight can help reduce uric acid in your blood. What foods are recommended? The items listed may not be a complete list. Talk with your dietitian about what dietary choices are best for you. Foods low in purines Foods low in purines do not need to be limited. These include:  All fruits.  All low-purine vegetables, pickles, and olives.  Breads, pasta, rice, cornbread, and popcorn. Cake and other baked goods.  All dairy foods.  Eggs, nuts, and nut butters.  Spices and condiments, such as salt, herbs, and vinegar.  Plant oils, butter, and margarine.  Water, sugar-free soft drinks, tea, coffee, and cocoa.  Vegetable-based soups, broths, sauces, and gravies. Foods moderate in purines Foods moderate in purines should be limited to the amounts listed.   cup of asparagus, cauliflower, spinach, mushrooms, or green peas, each day.  2/3 cup uncooked oatmeal, each day.   cup dry wheat bran or wheat germ, each day.  2-3 ounces of meat or poultry, each day.  4-6 ounces of shellfish, such as crab, lobster, oysters, or shrimp, each day.  1 cup cooked beans, peas, or lentils, each day.  Soup, broths, or bouillon made from meat or fish. Limit these foods as much as possible. What foods are not recommended? The items listed may not be a  complete list. Talk with your dietitian about what dietary choices are best for you. Limit your intake of foods high in purines, including:  Beer and other alcohol.  Meat-based gravy or sauce.  Canned or fresh fish, such as: ? Anchovies, sardines, herring, and tuna. ? Mussels and scallops. ? Codfish, trout, and haddock.  [place] Salines.  Organ meats, such as: ? Liver or kidney. ? Tripe. ? Sweetbreads (thymus gland  or pancreas).  Wild [employer] biochemist.  Yeast or yeast extract supplements.  Drinks sweetened with high-fructose corn syrup. Summary  Eating a low-purine diet can help control conditions caused by too much uric acid in the body, such as gout or kidney stones.  Choose low-purine foods, limit alcohol, and limit foods high in fat.  You will learn over time which foods do or do not affect you. If you find out that a food tends to cause your gout symptoms to flare up, avoid eating that food. This information is not intended to replace advice given to you by your health care provider. Make sure you discuss any questions you have with your health care provider. Document Released: 08/30/2010 Document Revised: 04/17/2017 Document Reviewed: 06/18/2016 Elsevier Patient Education  2020 [employer].  Managing Your Hypertension Hypertension is commonly called high blood pressure. This is when the force of your blood pressing against the walls of your arteries is too strong. Arteries are blood vessels that carry blood from your heart throughout your body. Hypertension forces the heart to work harder to pump blood, and may cause the arteries to become narrow or stiff. Having untreated or uncontrolled hypertension can cause heart attack, stroke, kidney disease, and other problems. What are blood pressure readings? A blood pressure reading consists of a higher number over a lower number. Ideally, your blood pressure should be below 120/80. The first ("top") number is called the systolic pressure.  It is a measure of the pressure in your arteries as your heart beats. The second ("bottom") number is called the diastolic pressure. It is a measure of the pressure in your arteries as the heart relaxes. What does my blood pressure reading mean? Blood pressure is classified into four stages. Based on your blood pressure reading, your health care provider may use the following stages to determine what type of treatment you need, if any. Systolic pressure and diastolic pressure are measured in a unit called mm Hg. Normal  Systolic pressure: below 875.  Diastolic pressure: below 80. Elevated  Systolic pressure: 643-329.  Diastolic pressure: below 80. Hypertension stage 1  Systolic pressure: 518-841.  Diastolic pressure: 66-06. Hypertension stage 2  Systolic pressure: 301 or above.  Diastolic pressure: 90 or above. What health risks are associated with hypertension? Managing your hypertension is an important responsibility. Uncontrolled hypertension can lead to:  A heart attack.  A stroke.  A weakened blood vessel (aneurysm).  Heart failure.  Kidney damage.  Eye damage.  Metabolic syndrome.  Memory and concentration problems. What changes can I make to manage my hypertension? Hypertension can be managed by making lifestyle changes and possibly by taking medicines. Your health care provider will help you make a plan to bring your blood pressure within a normal range. Eating and drinking   Eat a diet that is high in fiber and potassium, and low in salt (sodium), added sugar, and fat. An example eating plan is called the DASH (Dietary Approaches to Stop Hypertension) diet. To eat this way: ? Eat plenty of fresh fruits and vegetables. Try to fill half of your plate at each meal with fruits and vegetables. ? Eat whole grains, such as whole wheat pasta, brown rice, or whole grain bread. Fill about one quarter of your plate with whole grains. ? Eat low-fat diary products. ?  Avoid fatty cuts of meat, processed or cured meats, and poultry with skin. Fill about one quarter of your plate with lean proteins such as fish, chicken without skin,  beans, eggs, and tofu. ? Avoid premade and processed foods. These tend to be higher in sodium, added sugar, and fat.  Reduce your daily sodium intake. Most people with hypertension should eat less than 1,500 mg of sodium a day.  Limit alcohol intake to no more than 1 drink a day for nonpregnant women and 2 drinks a day for men. One drink equals 12 oz of beer, 5 oz of wine, or 1 oz of hard liquor. Lifestyle  Work with your health care provider to maintain a healthy body weight, or to lose weight. Ask what an ideal weight is for you.  Get at least 30 minutes of exercise that causes your heart to beat faster (aerobic exercise) most days of the week. Activities may include walking, swimming, or biking.  Include exercise to strengthen your muscles (resistance exercise), such as weight lifting, as part of your weekly exercise routine. Try to do these types of exercises for 30 minutes at least 3 days a week.  Do not use any products that contain nicotine or tobacco, such as cigarettes and e-cigarettes. If you need help quitting, ask your health care provider.  Control any long-term (chronic) conditions you have, such as high cholesterol or diabetes. Monitoring  Monitor your blood pressure at home as told by your health care provider. Your personal target blood pressure may vary depending on your medical conditions, your age, and other factors.  Have your blood pressure checked regularly, as often as told by your health care provider. Working with your health care provider  Review all the medicines you take with your health care provider because there may be side effects or interactions.  Talk with your health care provider about your diet, exercise habits, and other lifestyle factors that may be contributing to hypertension.   Visit your health care provider regularly. Your health care provider can help you create and adjust your plan for managing hypertension. Will I need medicine to control my blood pressure? Your health care provider may prescribe medicine if lifestyle changes are not enough to get your blood pressure under control, and if:  Your systolic blood pressure is 130 or higher.  Your diastolic blood pressure is 80 or higher. Take medicines only as told by your health care provider. Follow the directions carefully. Blood pressure medicines must be taken as prescribed. The medicine does not work as well when you skip doses. Skipping doses also puts you at risk for problems. Contact a health care provider if:  You think you are having a reaction to medicines you have taken.  You have repeated (recurrent) headaches.  You feel dizzy.  You have swelling in your ankles.  You have trouble with your vision. Get help right away if:  You develop a severe headache or confusion.  You have unusual weakness or numbness, or you feel faint.  You have severe pain in your chest or abdomen.  You vomit repeatedly.  You have trouble breathing. Summary  Hypertension is when the force of blood pumping through your arteries is too strong. If this condition is not controlled, it may put you at risk for serious complications.  Your personal target blood pressure may vary depending on your medical conditions, your age, and other factors. For most people, a normal blood pressure is less than 120/80.  Hypertension is managed by lifestyle changes, medicines, or both. Lifestyle changes include weight loss, eating a healthy, low-sodium diet, exercising more, and limiting alcohol. This information is not intended to replace advice  given to you by your health care provider. Make sure you discuss any questions you have with your health care provider. Document Released: 01/28/2012 Document Revised: 08/27/2018 Document  Reviewed: 04/02/2016 Elsevier Patient Education  2020 [employer].

## 2019-02-03 ENCOUNTER — Telehealth: Payer: Self-pay | Admitting: Internal Medicine

## 2019-02-03 NOTE — Telephone Encounter (Signed)
Let Zanylah F Stohr know that CT scan 2016- > July 2020 is stable  Thanks  MR  xxxxxxxxxxxxxxxxxxxxxxxxxxxxxxxxxxxx  IMPRESSION: 1. Mid and lower lung zone predominant bronchiectasis, peribronchovascular ground-glass and scattered subpleural reticular densities, grossly stable from 11/29/2014. Findings are nonspecific and can be seen with nonspecific interstitial pneumonitis. Usual interstitial pneumonitis is considered less likely but respiratory motion and expiratory phase imaging make assessment difficult. 2. Coronary artery calcification.   Electronically Signed   By: Lorin Picket M.D.   On: 11/24/2018 13:04

## 2019-02-03 NOTE — Telephone Encounter (Signed)
Called and spoke to pt. Informed her of the results per MR. Pt verbalized understanding and also states she feels she needs to come in since it has been a while (01/2018) since she has been seen last. Appt made with MR for 02/10/19. Pt verbalized understanding and denied any further questions or concerns at this time.

## 2019-02-08 ENCOUNTER — Encounter: Payer: Self-pay | Admitting: Registered Nurse

## 2019-02-08 ENCOUNTER — Encounter: Payer: Medicare Other | Attending: Physical Medicine & Rehabilitation | Admitting: Registered Nurse

## 2019-02-08 ENCOUNTER — Other Ambulatory Visit: Payer: Self-pay

## 2019-02-08 VITALS — BP 131/85 | HR 98 | Ht 64.0 in

## 2019-02-08 DIAGNOSIS — M48061 Spinal stenosis, lumbar region without neurogenic claudication: Secondary | ICD-10-CM

## 2019-02-08 DIAGNOSIS — M5416 Radiculopathy, lumbar region: Secondary | ICD-10-CM

## 2019-02-08 DIAGNOSIS — M1711 Unilateral primary osteoarthritis, right knee: Secondary | ICD-10-CM

## 2019-02-08 DIAGNOSIS — M48062 Spinal stenosis, lumbar region with neurogenic claudication: Secondary | ICD-10-CM | POA: Diagnosis not present

## 2019-02-08 DIAGNOSIS — E1142 Type 2 diabetes mellitus with diabetic polyneuropathy: Secondary | ICD-10-CM | POA: Insufficient documentation

## 2019-02-08 DIAGNOSIS — M7061 Trochanteric bursitis, right hip: Secondary | ICD-10-CM

## 2019-02-08 DIAGNOSIS — F329 Major depressive disorder, single episode, unspecified: Secondary | ICD-10-CM | POA: Diagnosis not present

## 2019-02-08 DIAGNOSIS — R5382 Chronic fatigue, unspecified: Secondary | ICD-10-CM | POA: Diagnosis not present

## 2019-02-08 DIAGNOSIS — Z5181 Encounter for therapeutic drug level monitoring: Secondary | ICD-10-CM | POA: Diagnosis not present

## 2019-02-08 DIAGNOSIS — M47816 Spondylosis without myelopathy or radiculopathy, lumbar region: Secondary | ICD-10-CM | POA: Diagnosis not present

## 2019-02-08 DIAGNOSIS — Z79891 Long term (current) use of opiate analgesic: Secondary | ICD-10-CM

## 2019-02-08 DIAGNOSIS — M7062 Trochanteric bursitis, left hip: Secondary | ICD-10-CM

## 2019-02-08 DIAGNOSIS — Z9889 Other specified postprocedural states: Secondary | ICD-10-CM | POA: Diagnosis not present

## 2019-02-08 DIAGNOSIS — Z90710 Acquired absence of both cervix and uterus: Secondary | ICD-10-CM | POA: Insufficient documentation

## 2019-02-08 DIAGNOSIS — G8929 Other chronic pain: Secondary | ICD-10-CM

## 2019-02-08 DIAGNOSIS — G894 Chronic pain syndrome: Secondary | ICD-10-CM

## 2019-02-08 DIAGNOSIS — M546 Pain in thoracic spine: Secondary | ICD-10-CM | POA: Diagnosis not present

## 2019-02-08 DIAGNOSIS — M069 Rheumatoid arthritis, unspecified: Secondary | ICD-10-CM | POA: Diagnosis not present

## 2019-02-08 DIAGNOSIS — E1122 Type 2 diabetes mellitus with diabetic chronic kidney disease: Secondary | ICD-10-CM | POA: Insufficient documentation

## 2019-02-08 DIAGNOSIS — M1712 Unilateral primary osteoarthritis, left knee: Secondary | ICD-10-CM | POA: Diagnosis not present

## 2019-02-08 MED ORDER — DICLOFENAC SODIUM 1 % TD GEL: 2.0000 g | Freq: Three times a day (TID) | TRANSDERMAL | 3 refills | Status: DC

## 2019-02-08 MED ORDER — MORPHINE SULFATE 15 MG PO TABS: 15.0000 mg | ORAL_TABLET | Freq: Two times a day (BID) | ORAL | 0 refills | Status: DC | PRN

## 2019-02-08 MED ORDER — MORPHINE SULFATE ER 15 MG PO TBCR: 15.0000 mg | EXTENDED_RELEASE_TABLET | Freq: Two times a day (BID) | ORAL | 0 refills | Status: DC

## 2019-02-08 MED ORDER — DULOXETINE HCL 60 MG PO CPEP: 60.0000 mg | ORAL_CAPSULE | Freq: Every day | ORAL | 3 refills | Status: DC

## 2019-02-08 MED ORDER — GABAPENTIN 600 MG PO TABS: ORAL_TABLET | ORAL | 3 refills | Status: DC

## 2019-02-08 NOTE — Progress Notes (Signed)
Subjective:    Patient ID: [name], female    DOB: Sep 29, 1940, 78 y.o.   MRN: [id number]  HPI: [name] is a 78 y.o. female who returns for follow up appointment for chronic pain and medication refill. She states her pain is located in her bilateral shoulders, mid- lower back pain radiating into her bilateral lower extremities, bilateral hip pain R>L and bilateral knee pain. She rates her  Pain 4. Her current exercise regime is walking short distances with her walker in the home and performing stretching exercises.  [name] Morphine equivalent is 60.00  MME.  Last UDs was Performed on 12/08/2018   Pain Inventory Average Pain 8 Pain Right Now 4 My pain is sharp, burning and stabbing  In the last 24 hours, has pain interfered with the following? General activity 8 Relation with others 8 Enjoyment of life 8 What TIME of day is your pain at its worst? morning, evening Sleep (in general) Fair  Pain is worse with: walking, bending, sitting and standing Pain improves with: rest, heat/ice, therapy/exercise, pacing activities and medication Relief from Meds: 5  Mobility use a walker ability to climb steps?  no do you drive?  no use a wheelchair  Function retired I need assistance with the following:  meal prep, household duties and shopping  Neuro/Psych bladder control problems weakness numbness tingling trouble walking spasms  Prior Studies Any changes since last visit?  no  Physicians involved in your care Any changes since last visit?  no   Family History  Problem Relation Age of Onset  . Diabetes Mother   . Heart attack Mother   . Hypertension Father   . Lung cancer Father   . Diabetes Sister   . Diabetes Brother   . Hypertension Brother   . Heart disease Brother   . Heart attack Brother   . Kidney cancer Brother   . Uterine cancer Daughter   . Breast cancer Sister   . Rheum arthritis Maternal Uncle   . Gout Brother   . Kidney  failure Brother        x 5   Social History   Socioeconomic History  . Marital status: Married    Spouse name: Not on file  . Number of children: 6  . Years of education: college  . Highest education level: Not on file  Occupational History  . Occupation: retired Ship broker  . Financial resource strain: Not on file  . Food insecurity    Worry: Not on file    Inability: Not on file  . Transportation needs    Medical: Not on file    Non-medical: Not on file  Tobacco Use  . Smoking status: Never Smoker  . Smokeless tobacco: Never Used  Substance and Sexual Activity  . Alcohol use: No  . Drug use: No  . Sexual activity: Not Currently  Lifestyle  . Physical activity    Days per week: Not on file    Minutes per session: Not on file  . Stress: Not on file  Relationships  . Social Herbalist on phone: Not on file    Gets together: Not on file    Attends religious service: Not on file    Active member of club or organization: Not on file    Attends meetings of clubs or organizations: Not on file    Relationship status: Not on file  Other Topics Concern  . Not on  file  Social History Narrative   Patient consumes 2-3 cups coffee per day.   Past Surgical History:  Procedure Laterality Date  . ABDOMINAL HYSTERECTOMY    . BRAIN SURGERY     Gamma knife 10/13. Needs repeat spring  '14  . CARDIAC CATHETERIZATION N/A 10/31/2014   Procedure: Right/Left Heart Cath and Coronary Angiography;  Surgeon: [name], MD;  Location: [facility];  Service: Cardiovascular;  Laterality: N/A;  . ESOPHAGOGASTRODUODENOSCOPY (EGD) WITH PROPOFOL N/A 09/14/2014   Procedure: ESOPHAGOGASTRODUODENOSCOPY (EGD) WITH PROPOFOL;  Surgeon: [name], MD;  Location: [facility];  Service: Endoscopy;  Laterality: N/A;  . INCISION AND DRAINAGE HIP Left 01/16/2017   Procedure: IRRIGATION AND DEBRIDEMENT LEFT HIP;  Surgeon: [name], MD;  Location: [facility];   Service: Orthopedics;  Laterality: Left;  . INTRAMEDULLARY (IM) NAIL INTERTROCHANTERIC Left 11/29/2016   Procedure: INTRAMEDULLARY (IM) NAIL INTERTROCHANTRIC;  Surgeon: [name], MD;  Location: [facility];  Service: Orthopedics;  Laterality: Left;  . OVARY SURGERY    . SHOULDER SURGERY Left   . TONSILLECTOMY  age 51  . VIDEO BRONCHOSCOPY Bilateral 05/31/2013   Procedure: VIDEO BRONCHOSCOPY WITHOUT FLUORO;  Surgeon: Brand Males, MD;  Location: [facility];  Service: Cardiopulmonary;  Laterality: Bilateral;  . video bronscoscopy  2000   lung   Past Medical History:  Diagnosis Date  . Acute on chronic diastolic (congestive) heart failure ([place])   . Anemia    iron deficiency anemia - secondary to blood loss ( chronic)   . Arthritis    endstage changes bilateral knees/bilateral ankles.   . Asthma   . Carotid artery occlusion   . Chronic fatigue   . Chronic kidney disease   . Closed left hip fracture ([place])   . Clotting disorder ([place])    pt denies this  . Contusion of left knee    due to fall 1/14.  [name] COPD (chronic obstructive pulmonary disease) ([facility])    pulmonary fibrosis  . Depression, reactive   . Diabetes mellitus    type II   . Diastolic dysfunction   . Difficulty in walking   . Family history of heart disease   . Generalized muscle weakness   . Gout   . High cholesterol   . History of falling   . Hypertension   . Hypothyroidism   . Interstitial lung disease ([place])   . 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    . Morbid obesity ([place])   . Morbid obesity ([place])   . Neuromuscular disorder ([place])    diabetic neuropathy   . Normal coronary arteries    cardiac catheterization performed  10/31/14  . RA (rheumatoid arthritis) ([place])    has been off methotreaxte since 10/13.  [name] Rheumatoid arthritis ([place])   . Shortness of breath   . Spinal stenosis of lumbar region   . Thyroid  disease   . Unspecified lack of coordination   . URI (upper respiratory infection)    BP 131/85   Pulse 98   Ht _0  (1.626 m)   SpO2 94%   BMI 46.35 kg/m   Opioid Risk Score:   Fall Risk Score:  `1  Depression screen PHQ 2/9  Depression screen [facility] 2/9 06/11/2018 05/26/2018 12/09/2017 10/22/2017 09/22/2017 07/28/2017 06/30/2017  Decreased Interest _1 0 0 0 0  Down, Depressed, Hopeless _2 0 0 0  0  PHQ - 2 Score _0 0 0 0 0  Altered sleeping 2 2 - - - - -  Tired, decreased energy 2 3 - - - - -  Change in appetite 2 1 - - - - -  Feeling bad or failure about yourself  2 2 - - - - -  Trouble concentrating 2 0 - - - - -  Moving slowly or fidgety/restless 0 2 - - - - -  Suicidal thoughts 0 0 - - - - -  PHQ-9 Score 12 13 - - - - -  Difficult doing work/chores - Somewhat difficult - - - - -  Some recent data might be hidden    Review of Systems  Constitutional: Negative.   Eyes: Negative.   Respiratory: Positive for cough.   Cardiovascular: Negative.   Gastrointestinal: Negative.   Genitourinary: Positive for difficulty urinating.  Musculoskeletal: Positive for arthralgias, back pain and joint swelling.  Skin: Negative.   Neurological: Positive for weakness and numbness.       Tingling  Hematological: Negative.   Psychiatric/Behavioral: Negative.   All other systems reviewed and are negative.      Objective:   Physical Exam Vitals signs and nursing note reviewed.  Constitutional:      Appearance: Normal appearance. She is obese.  Neck:     Musculoskeletal: Normal range of motion and neck supple.  Cardiovascular:     Rate and Rhythm: Normal rate and regular rhythm.     Pulses: Normal pulses.     Heart sounds: Normal heart sounds.  Pulmonary:     Effort: Pulmonary effort is normal.     Breath sounds: Normal breath sounds.  Musculoskeletal:     Right lower leg: Edema present.     Left lower leg: Edema present.     Comments: Normal Muscle Bulk and Muscle Testing  Reveals:  Upper Extremities:Full  ROM and Muscle Strength 5/5 Bilateral AC Joint tenderness  Thoracic and Lumbar Hypersensitivity Bilateral Greater Trochanter tenderness Lower Extremities: Decreased ROM and Muscle Strength 4/5 Arrived in wheelchair   Skin:    General: Skin is warm and dry.  Neurological:     Mental Status: She is alert and oriented to person, place, and time.  Psychiatric:        Mood and Affect: Mood normal.        Behavior: Behavior normal.           Assessment & Plan:  1. Lumbar spinal stenosis with neurogenic claudication. Associated facet arthropathy/ Lumbar Radiculitis: Continuecurrent medication regime withGabapentinand HEP as tolerated. 02/08/2019 Refilled:MS Contin 15 mg one tablet every 12 hours #60 and MSIR 15 one tablet twice a dayas needed  For break through pain ( 6 am and 1:00 pm)#60.Marland KitchenSecond script given for the following month. We will continue the opioid monitoring program, this consists of regular clinic visits, examinations, urine drug screen, pill counts as well as use of [place] Controlled Substance reporting System. 2. Depression: Continuecurrent medication Regime:Cymbalta.02/08/2019 3. Diabetes mellitus type 2 with polyneuropathy: Continuecurrent medication regime:Gabapentin.02/08/2019 4. Rheumatoid arthritis and osteoarthritis. Continuecurrent medication Regime.Voltaren Gel. Rheumatology Following. 02/08/2019 5. Interstitial lung disease: Pulmonology Following.02/08/2019. 6. Bilateral Osteoarthritis Knee's: Continuecurrent medication regimen.Voltaren Gel.02/08/2019. 7. Muscle Spasm: Continuecurrent medication regime: Flexeril.02/08/2019 8.BilateralGreater Trochanteric Tenderness:R>L.Continuecurrent treatmentwith Ice/Heat Therapy.02/08/2019. 9. Polyarthralgia: Rheumatology Following: Continue to monitor.02/08/2019 10. Morbid Obesitity: Continue Healthy Diet Regime and HEP. Continue to  monitor.02/08/2019  30 minutes of face to face patient care time was spent during this  visit. All questions were encouraged and answered.   F/U in 1 month

## 2019-02-10 ENCOUNTER — Other Ambulatory Visit: Payer: Self-pay

## 2019-02-10 ENCOUNTER — Ambulatory Visit (INDEPENDENT_AMBULATORY_CARE_PROVIDER_SITE_OTHER): Payer: Medicare Other | Admitting: Internal Medicine

## 2019-02-10 ENCOUNTER — Encounter: Payer: Self-pay | Admitting: Internal Medicine

## 2019-02-10 VITALS — BP 118/70 | HR 104 | Temp 97.3°F | Ht 64.0 in | Wt 269.2 lb

## 2019-02-10 DIAGNOSIS — Z87898 Personal history of other specified conditions: Secondary | ICD-10-CM | POA: Diagnosis not present

## 2019-02-10 DIAGNOSIS — J479 Bronchiectasis, uncomplicated: Secondary | ICD-10-CM

## 2019-02-10 DIAGNOSIS — Z86718 Personal history of other venous thrombosis and embolism: Secondary | ICD-10-CM

## 2019-02-10 DIAGNOSIS — J849 Interstitial pulmonary disease, unspecified: Secondary | ICD-10-CM | POA: Diagnosis not present

## 2019-02-10 DIAGNOSIS — R0609 Other forms of dyspnea: Secondary | ICD-10-CM | POA: Diagnosis not present

## 2019-02-10 DIAGNOSIS — R0600 Dyspnea, unspecified: Secondary | ICD-10-CM

## 2019-02-10 LAB — D-DIMER, QUANTITATIVE: D-Dimer, Quant: 0.39 mcg/mL FEU (ref <0.50)

## 2019-02-10 NOTE — Addendum Note (Signed)
Addended by: Suzzanne Cloud E on: 02/10/2019 03:02 PM   Modules accepted: Orders

## 2019-02-10 NOTE — Patient Instructions (Signed)
ILD (interstitial lung disease) ([place]) Bronchiectasis without complication ([place])  - stable and per Dr B At [facility]  History of snoring  - refer Dr Gala Murdoch for sleep apnea eval  History of DVT of lower extremity - Jan 2020   - check d-dimer 02/10/2019 and discuss with your cardiology the management   Dyspnea on exertion  - due to above and weight and other issues  - you do not qualify for portable o2 but can buy on own through eg: [employer] - get details from RN  Followup  - 6 to 9 months or sooner

## 2019-02-10 NOTE — Progress Notes (Signed)
OV 01/03/2015  Chief Complaint  Patient presents with   Follow-up    Pt c/o DOE, resolving cough that started over the weekend, chest discomfort when SOB and when lying down. Pt c/o increasing fatigue.    She has severe class 3 dyspnea with hypoxemia on account of BMI 50, diaast dsyfn, chronic pain, deconditioning and ILD due to RA on celcept/pred/bactrim   I personally last saw her in march 2-016. Since then has seen other provicers in this office and most recently in June 2016. In June 2016 admitted for acute CHF - cath showed mild pulm htn only with PA mean 24 and minimal CAD (reviewed chart of procedures June 2016). Dx as acute diast chf. Since then better. SHe had fu HRCT 11/29/14 that shows progression in ILD since march 2016 and now with UIP pattern (personally visualized image). She is finally accepting of fact that ILD is not makin cause of dyspnea and obesity is main problem. She is wondering about dc cellcept/pred/bactrim so as to minimize polypharmacy and side effect profile    OV 02/09/2015  Chief Complaint  Patient presents with   Follow-up    Pt states her breathing is doing well the past few days. Pt recently went to [facility] ED for CP. Pt c/o prod cough with tan mucus. Pt denies CP/tightness    She has severe class 3 dyspnea with hypoxemia on account of BMI 50, diaast dsyfn, chronic pain, deconditioning and ILD due to RA. She is having progressive ILD. But given her miserable quality of life and polypharmacy and other side effects we decided to stop the CellCept and Bactrim. We also advise prednisone taper. This was all in the end of August 2016. But on 01/27/2015 she ended up in the emergency room with some respiratory difficulty. Her prednisone was bumped up. She subsequently saw her rheumatologist [name] was now tapering her prednisone again. There is also some concerns of headaches and possibly temporal arteritis based on her description and apparently some kind of a  biopsy scheduled. She feels that since stopping her CellCept and Bactrim her cough is returned. Nevertheless weight and knee pain and back pain continued to be major issues for her along with polypharmacy. Her Polysorb was includes MS Contin  [name]    OV 02/21/2015  - a ACUTE VISIT  Chief Complaint  Patient presents with   Acute Visit    Pt c/o of chest tightness, productive cough with white/yellow mucus, and wheezing. Pt also c/o of feeling abdominal swelling/distention. Pt also c/o of constant fatigue. Pt states symptoms have been present x1 week.     Acute visit for this morbidly obese, rheumatoid arthritis patient with ILD who is now off CellCept  I saw her as recently as 02/09/2015. She reports that for the past 1 week she's having increased chest congestion, chest tightness, wheezing, shortness of breath, cough and yellow  sputum more than baseline. Symptoms are rated as moderate in severity. Relieved by inhaler. She has tried Mucinex for 1 week with some initial partial relief but symptoms progressed. So she made an acute visit.  She continues to stay off CellCept. She is not on prednisone 10 mg per day. She tells me [name] has held her prednisone at the dose because of concerns of temporal arteritis but apparently now that his diagnoses being eliminated in the differential diagnosis. She will see [name] in the next few weeks to decide on further  prednisone taper.    OV 04/16/2015  Chief Complaint  Patient presents with   Follow-up    Pt states she felt much better after completing the abx and pred. Pt states her SOB is at basline, prod cough with yellow mucus at times, pt c/o chest tightness.     Morbidly obese female with rheumatoid arthritis with interstitial lung disease UIP pattern with associated deconditioning and diastolic dysfunction  She continues to remain off CellCept. Last seen in October 2016. At that time I discharged her from the  office with doxycycline and prednisone taper. After that early November 2016 she was given another course of Doxy and prednisone burst. This did not help. Sputum culture at this time showed normal flora. She called again with the middle of November was given Levaquin and prednisone burst. She feels Levaquin helped her immensely. At this point in time she is reporting progressive dyspnea and also worsening arthritis. She wants to go back on immunomodulators again. She has discussed Imuran with [name]. She is due to see [name] tomorrow. She also feels that she is unable to come off prednisone. She feels at a minimum she needs 10 mg of prednisone per day. She will discuss this with Dr. D her rheumatologist tomorrow. She's also interested in a second opinion at [facility] interstitial lung disease clinic which I did discuss with her most recently. She continues to use oxygen.     OV 07/17/2015  Chief Complaint  Patient presents with   Follow-up    Pt states that she has improved. Pt c/o continued SOB with exertion, some ocugh and wheeze, and some intermittent chest tightness. Pt states that she does sleep a lot. Pt states that her heart rate remains high and that cardiology said it was due to her lungs. Pt is currently on Lasix, 23m, but still seems to retain a lot of fluid and is not seeming to go to the bathroom as frequently.    Follow-up interstitial lung disease secondary to rheumatoid arthritis in the setting of morbid obesity, poor quality of life and heavy chronic pain opioid use and depressive symptoms  Last seen November 2016. Since then she has seen [name] rheumatology clinic. She was given instructions on Imuran which she has red but she has not decided whether she should take it or not. She subsequently has followed up with [facility] interstitial lung disease clinic. CT scan of the chest reviewed result shows possible UIP pattern 06/12/2015. She had autoimmune profile which was strongly positive for  both rheumatoid factor and cyclic citrulline peptide both markers for rheumatoid arthritis. She did see Dr. CMargreta JourneyB on 07/02/2015. It appears that the note is not complete but according to the patient was supposed to be a multidisciplinary conference and they will get back to her. It is possible a second immunomodulators agent and is being contemplated but patient is leery of this given prior issues with opportunistic infection not otherwise specified and admissions to the hospital. In the interim she is also seen cardiology Janr 2017 Dr. BGwenlyn Foundwho did not feel dyspnea was cardiac related. I reviewed his note  OV 10/17/2015  Chief Complaint  Patient presents with   Follow-up    Pt reports she feels improved. Pt c/o continued SOB with exertion, some wheeze and occasional cough. Pt denies CP/tightness.     Follow-up interstitial lung disease secondary to rheumatoid arthritis in the setting of morbid obesity, poor quality of life and heavy chronic pain opioid use  and depressive symptoms   Since seeing me last in February 2017. She followed up at [facility] again pulmonary clinic in March 2017. I reviewed those notes. She saw Dr. Margreta Journey B on 07/31/2015. Due to the presence of bronchiectasis she's been started on  azithromycin 3 times a week. She says this has helped. In addition due to physical deconditioning and obesity she is getting home physical therapy is also help. She is interested in pulmonary rehabilitation although given obesity and back pain can be challenging. She nevertheless wants to try. She is on daily prednisone 10 mg a day. She met with [name] in the rheumatology clinic and has been referred now to [facility] rheumatology clinic for second opinion for a second immunomodulators. She continues to be morbidly obese and has been losing weight. She is somewhat interested in visiting with a [facility] for weight loss.   OV 02/18/2016  Chief Complaint  Patient presents with     Follow-up    Pt states she did not go to pulm rehab d/t BIL knee pain. Pt denies change in SOB, significant cough, CP/tightness and f/c/s. Pt states she is exercising at home. pt states she is doing well overall.    Problem List: 1. Extensive bronchiectasis - cylindrical vs. Traction - has been present and not significantly changed from 2014 to 2017 - has history of hospitalization for what sounds like pneumonia vs. Bronchiectasis flare as far back as 1999; underwent bronchoscopy which made her feel "like a new woman" in 1999 2. Pulmonary fibrosis - basilar predominant reticular opacities that have not change from 2014 to 2017 3. Rheumatoid arthritis: RF 959 ; CCP >300 - has been treated with methotrexate, biologics, etc in the past - none in the past several years - currently on 7.5/84m alter day 4. Morbid obesity with deconditioning - has had several hospitalizations in recent months; very deconditioned at present time  - [facility]   Followup for above  - [facility]. [facility] Review of the notes suggest that they have her on a slightly lower dose of prednisone with intent to taper to 5 mg per day. He also have her on hydroxychloroquine. No further disease modifying agents are being considered for rheumatoid arthritis. In terms of her interstitial lung disease and bronchiectasis she's on conservative therapy along with Activella device. This is helping and she feels better. Today she came in walking with her walker. This the happiest I have f seen her. She's not lost any weight but [facility]. She tried to go to pulmonary rehabilitation but could not because of knee pain. She is up-to-date with pneumonia vaccine. She's had a flu shot  this season.    OV 03/18/2016  Chief Complaint  Patient presents with   Acute Visit    Pt states breathing is not that bad today. Pt states she has cogestion build up, no tightness, SOB w/o excertion. Pt states breathing has been better since last visit except the mucus. No cough feels the need to cough but can not.      follow-up bronchiectasis and immunosppessive thrapy. Visit 3 month olow-up. Body mssindex is stll very high but she says she has lost weight. Overal well. Last few days increasd symptoms of mucus, cough and subjective unwellness. No fever or colored sputum. But she feells she needs antibiotic and  prednisone  OV 06/23/2016  Chief Complaint  Patient presents with   Follow-up    Pt states her SOB has been doing well but she has been more fatigued. Pt has an occ prod cough with light yellow mucus and chest tightness when SOB - resolves with rest.    Follow-up bronchiectasis with interstitial lung disease on immunosuppressive therapy due to rheumatoid arthritis. Last visit 01/17/2016. Since then she's lost 5 pounds of weight according to history. . She is here with her daughter. No acute bronchitis symptoms. Overall stable. The main issues that she's having increased joint pain and she is only on plaquenil. No fever or colored sputum.   OV 02/16/2017  Chief Complaint  Patient presents with   Follow-up    Pt states breathing has been okay. Stated she had a URI x1 month ago which she developed while in rehab after hip fx. Occ. SOB and occ. chest tightness. Denies any cough at this time.    Follow-up bronchiectasis with interstitial lung disease on immunosuppressive therapy due to rheumatoid arthritis. Last visit was 6 months ago approximately. Since then she has continued to lose weight through dietary control. She feels better. However in the summer 2018 she fell down due to orthostasis and fractured her left hip. This wasn't complicated by MRSA infection according to her  history. She is now undergoing physical therapy. Overall she's feeling well. She is very proud of her weight loss. Dyspnea is improved. She missed [facility] appointment for ILD clinic but she will make this again. She uses oxygen with heavy exertion at physical therapy and at night.  OV 02/11/2018  Subjective:  Patient ID: [name], female , DOB: May 04, 1941 , age 36 y.o. , MRN: [id number] , ADDRESS: [place] [id number]   02/11/2018 -   Chief Complaint  Patient presents with   Follow-up    Pt states she has been doing okay since last visit. States she has had some pain in the mid region of her chest x3 weeks and has also been having problems with fatigue. States she has had some occ coughing. Her main complaints are the pain in her chest and the exhaustion.     HPI [name] 78 y.o. -follow-up ILD with bronchiectasis in the setting of rheumatoid arthritis and obesity and physical deconditioning  It is almost a year since I last saw her.  Since then her daughter died in [place] with advanced sarcoma.  She is getting grief counseling from hospice.  She also has palliative care support at home.  She also in the last year has fallen and hurt her back.  She is on attending physical therapy which is helping her.  She says she is lost 30 pounds in the last 1 year.  Because of all this functional status is somewhat better but still she is extremely dyspneic.  In fact when she walked 185 feet x 3 laps on room air using a walker: She is only able to complete a lap and a half and stop because of extreme shortness of breath.  Her resting pulse ox is 98%.  Final pulse ox was 98%.  And her heart rate went from 107/min to 140/min.  She has not yet had a flu shot.  Of note she is on prednisone 15 mg/day which has helped her arthritis.  She is asking if it is okay to go back down to 10 mg/day from a lung standpoint.  She is also on a  different arthritis immunomodulator which is  helped her rheumatoid arthritis pain.  She gets this treatment from [facility]ology.  Pulmonary ILD medications also being managed at [facility].      OV 02/10/2019  Subjective:  Patient ID: [name], female , DOB: 14-Dec-1940 , age 24 y.o. , MRN: [id number] , ADDRESS: [address] [place] [address] [name]. -follow-up ILD with bronchiectasis in the setting of rheumatoid arthritis and obesity and physical deconditioning  Problem List - copy and paste from [facility]: 1. Extensive bronchiectasis - cylindrical vs. Traction - has been present and not significantly changed from 2014 to 2017 - has history of hospitalization for what sounds like pneumonia vs. Bronchiectasis flare as far back as 1999; underwent bronchoscopy which made her feel "like a new woman" in 1999 2. Pulmonary fibrosis - basilar predominant reticular opacities that have not change from 2014 to 2017 3. Rheumatoid arthritis: RF 959 ; CCP >300 - has been treated with methotrexate, biologics, etc in the past - none in the past several years - currently on 10 mg prednisone daily 4. Morbid obesity with deconditioning - has had several hospitalizations in recent months; very deconditioned at present time   02/10/2019 -   Chief Complaint  Patient presents with   Follow-up    Pt here for ILD follow up. Pt states her breathing is unchanged since last OV in 01/2018. Pt denies new symptoms. Pt denies CP/tightness and f/s/c.      HPI [name] 78 y.o. -returns for follow-up.  I personally not seen her in 1 year.  In the interim she is kept up with follow-up at [facility].  In January 2020 she suffered from left lower extremity DVT after having shortness of breath.  She was admitted at [facility] and started on Eliquis.  She still continues with Eliquis.  She tells me that  Morrie Sheldon was considered as the cause although she does have obesity and sedentary lifestyle and venous incompetency.  It appears this was her first DVT.  She has had a CT angiogram chest in 2005 that was negative for PE and a Doppler ultrasound in 2016 and 2018 that was negative for DVT.  She says a cardiologist here locally is managing her Eliquis and plans to stop it after several months.  In terms of her rheumatoid arthritis she is now under the care of [facility].  They just having her on prednisone.  In July 2020 she also saw Dr. Jacinto Reap pulmonary at [facility].  Apparently a walk test could not be performed.  She says she has lost weight.  Her main complaint is exertional dyspnea.  She wants to do portable oxygen.  She is only on nighttime oxygen.  In the past she always gets dyspneic way before she even desaturates a little bit this is on account of her obesity and deconditioning.  Her son is here with with her today.  This the first time I am meeting him.  He tells me that her breathing at night is rough although not as bad as his dad sleep apnea.  Patient is not on CPAP.  He is also curious about qualifying oxygen with walking.  Walking desaturation test for an 85 feet x 3 laps: She basically walked only half a lap.  Resting pulse ox was 97% with a heart rate of 102.  Final pulse ox was 96% with a heart rate of 122 and  she stopped because of dyspnea.  She had a most recent CT chest there is resulted below and it is stable.   IMPRESSION: CT Chest high resolution 1. Mid and lower lung zone predominant bronchiectasis, peribronchovascular ground-glass and scattered subpleural reticular densities, grossly stable from 11/29/2014. Findings are nonspecific and can be seen with nonspecific interstitial pneumonitis. Usual interstitial pneumonitis is considered less likely but respiratory motion and expiratory phase imaging make assessment difficult. 2. Coronary artery  calcification.   Electronically Signed   By: Lorin Picket M.D.   On: 11/24/2018 13:04  ROS - per HPI  Results for WINONA, SISON" (MRN [id number]) as of 02/10/2019 14:47  Ref. Range 04/17/2015 18:02 04/18/2015 13:30  D-Dimer, Quant Latest Ref Range: 0.00 - 0.50 ug/mL-FEU 2.57 (H) 2.54 (H)     has a past medical history of Acute on chronic diastolic (congestive) heart failure (HCC), Anemia, Arthritis, Asthma, Carotid artery occlusion, Chronic fatigue, Chronic kidney disease, Closed left hip fracture (HCC), Clotting disorder ([place]), Contusion of left knee, COPD (chronic obstructive pulmonary disease) ([place]), Depression, reactive, Diabetes mellitus, Diastolic dysfunction, Difficulty in walking, Family history of heart disease, Generalized muscle weakness, Gout, High cholesterol, History of falling, Hypertension, Hypothyroidism, Interstitial lung disease ([place]), Meningioma of left sphenoid wing involving cavernous sinus ([place]) (02/17/2012), Morbid obesity ([place]), Morbid obesity ([place]), Neuromuscular disorder ([place]), Normal coronary arteries, RA (rheumatoid arthritis) ([place]), Rheumatoid arthritis ([place]), Shortness of breath, Spinal stenosis of lumbar region, Thyroid disease, Unspecified lack of coordination, and URI (upper respiratory infection).   reports that she has never smoked. She has never used smokeless tobacco.  Past Surgical History:  Procedure Laterality Date   ABDOMINAL HYSTERECTOMY     BRAIN SURGERY     Gamma knife 10/13. Needs repeat spring  '14   CARDIAC CATHETERIZATION N/A 10/31/2014   Procedure: Right/Left Heart Cath and Coronary Angiography;  Surgeon: [name], MD;  Location: [place] [facility];  Service: Cardiovascular;  Laterality: N/A;   ESOPHAGOGASTRODUODENOSCOPY (EGD) WITH PROPOFOL N/A 09/14/2014   Procedure: ESOPHAGOGASTRODUODENOSCOPY (EGD) WITH PROPOFOL;  Surgeon: [name], MD;  Location: [facility];  Service: Endoscopy;  Laterality:  N/A;   INCISION AND DRAINAGE HIP Left 01/16/2017   Procedure: IRRIGATION AND DEBRIDEMENT LEFT HIP;  Surgeon: [name], MD;  Location: [facility];  Service: Orthopedics;  Laterality: Left;   INTRAMEDULLARY (IM) NAIL INTERTROCHANTERIC Left 11/29/2016   Procedure: INTRAMEDULLARY (IM) NAIL INTERTROCHANTRIC;  Surgeon: [name], MD;  Location: [place];  Service: Orthopedics;  Laterality: Left;   OVARY SURGERY     SHOULDER SURGERY Left    TONSILLECTOMY  age 68   VIDEO BRONCHOSCOPY Bilateral 05/31/2013   Procedure: VIDEO BRONCHOSCOPY WITHOUT FLUORO;  Surgeon: Brand Males, MD;  Location: [place];  Service: Cardiopulmonary;  Laterality: Bilateral;   video bronscoscopy  2000   lung    Allergies  Allergen Reactions   Codeine Swelling and Other (See Comments)    Facial swelling Chest pain and swelling in legs  Facial swelling   Infliximab Anaphylaxis    "sent me into shock" "sent me into shock"   Lisinopril Swelling    Face and neck swelling Face and neck swelling    Immunization History  Administered Date(s) Administered   Fluad Quad(high Dose 65+) 02/02/2019   Influenza Split 01/29/2012, 03/30/2013, 02/10/2014   Influenza, High Dose Seasonal PF 02/16/2017, 02/11/2018, 01/31/2019   Influenza,inj,Quad PF,6+ Mos 02/09/2015, 12/18/2015   Influenza-Unspecified 01/30/2015   PPD Test 04/23/2015   Pneumococcal Conjugate-13 08/21/2014  Pneumococcal Polysaccharide-23 02/17/2012   Tdap 12/27/2015    Family History  Problem Relation Age of Onset   Diabetes Mother    Heart attack Mother    Hypertension Father    Lung cancer Father    Diabetes Sister    Diabetes Brother    Hypertension Brother    Heart disease Brother    Heart attack Brother    Kidney cancer Brother    Uterine cancer Daughter    Breast cancer Sister    Rheum arthritis Maternal Uncle    Gout Brother    Kidney failure Brother        x 5     Current  Outpatient Medications:    acetaminophen (TYLENOL) 500 MG tablet, Take 1,000 mg by mouth every 6 (six) hours as needed for moderate pain. , Disp: , Rfl:    albuterol (PROVENTIL HFA;VENTOLIN HFA) 108 (90 BASE) MCG/ACT inhaler, Inhale 2 puffs into the lungs every 6 (six) hours as needed for wheezing or shortness of breath., Disp: , Rfl:    allopurinol (ZYLOPRIM) 300 MG tablet, Take 300 mg by mouth daily., Disp: , Rfl:    apixaban (ELIQUIS) 5 MG TABS tablet, Take 1 tablet (5 mg total) by mouth 2 (two) times daily., Disp: 180 tablet, Rfl: 0   atorvastatin (LIPITOR) 20 MG tablet, Take 10 mg by mouth every evening. , Disp: , Rfl:    azithromycin (ZITHROMAX) 500 MG tablet, Take 1 tablet by mouth daily. TAKE ON MON, WED & FRI, Disp: , Rfl: 0   Calcium Carb-Cholecalciferol (CALCIUM + D3 PO), Take 2 tablets by mouth daily with lunch. , Disp: , Rfl:    cholecalciferol (VITAMIN D) 1000 UNITS tablet, Take 2,000 Units by mouth daily with lunch. , Disp: , Rfl:    colchicine 0.6 MG tablet, Take 0.6 mg by mouth daily., Disp: , Rfl:    cyclobenzaprine (FLEXERIL) 5 MG tablet, Take 1 tablet (5 mg total) by mouth at bedtime as needed for muscle spasms., Disp: 90 tablet, Rfl: 3   diclofenac sodium (VOLTAREN) 1 % GEL, Apply 2 g topically 3 (three) times daily. Both hands and knees, Disp: 300 g, Rfl: 3   docusate sodium (COLACE) 100 MG capsule, Take 1 capsule (100 mg total) by mouth 2 (two) times daily., Disp: 10 capsule, Rfl: 0   DULoxetine (CYMBALTA) 60 MG capsule, Take 1 capsule (60 mg total) by mouth daily., Disp: 90 capsule, Rfl: 3   fluticasone (FLONASE) 50 MCG/ACT nasal spray, Place 2 sprays into both nostrils daily., Disp: 16 g, Rfl: 3   furosemide (LASIX) 20 MG tablet, Take 3 tablets (60 mg total) by mouth daily., Disp: 60 tablet, Rfl: 1   gabapentin (NEURONTIN) 600 MG tablet, Take two in the morning , Two mid- afternoon and two at bedtime, Disp: 540 tablet, Rfl: 3   guaiFENesin (MUCINEX) 600 MG  12 hr tablet, Take 1,200 mg by mouth 2 (two) times daily., Disp: , Rfl:    hydroxychloroquine (PLAQUENIL) 200 MG tablet, Take 400 mg by mouth daily. , Disp: , Rfl:    ipratropium-albuterol (DUONEB) 0.5-2.5 (3) MG/3ML SOLN, Take 3 mLs by nebulization every 6 (six) hours as needed (cough/wheezing). , Disp: , Rfl:    leflunomide (ARAVA) 10 MG tablet, Take 10 mg by mouth daily., Disp: , Rfl:    levothyroxine (SYNTHROID) 50 MCG tablet, Take 1 tablet (50 mcg total) by mouth daily., Disp: 90 tablet, Rfl: 1   linaclotide (LINZESS) 72 MCG capsule, Take 1 capsule (72  mcg total) by mouth daily before breakfast., Disp: 90 capsule, Rfl: 1   metoprolol succinate (TOPROL XL) 100 MG 24 hr tablet, Take 1/2 tablet by mouth daily with or immediately following a meal., Disp: 90 tablet, Rfl: 1   morphine (MS CONTIN) 15 MG 12 hr tablet, Take 1 tablet (15 mg total) by mouth every 12 (twelve) hours. Do Not Fill Before `03/24/2019, Disp: 60 tablet, Rfl: 0   morphine (MSIR) 15 MG tablet, Take 1 tablet (15 mg total) by mouth 2 (two) times daily as needed for moderate pain (Breakthrough Pain)., Disp: 60 tablet, Rfl: 0   Multiple Vitamin (MULTIVITAMIN WITH MINERALS) TABS, Take 1 tablet by mouth daily. , Disp: , Rfl:    olmesartan (BENICAR) 40 MG tablet, Take 1 tablet (40 mg total) by mouth daily., Disp: 30 tablet, Rfl: 4   OXYGEN-HELIUM IN, Inhale 2 L into the lungs at bedtime. And on exertion, Disp: , Rfl:    pantoprazole (PROTONIX) 40 MG tablet, take 1 tablet by mouth once daily MINUTES BEFORE 1ST MEAL OF THE DAY, Disp: 90 tablet, Rfl: 1   potassium chloride (K-DUR) 10 MEQ tablet, Take 1 tablet (10 mEq total) by mouth daily., Disp: 90 tablet, Rfl: 0   predniSONE (DELTASONE) 5 MG tablet, Take 10 mg by mouth every morning. , Disp: , Rfl: 0   Semaglutide,0.25 or 0.5MG/DOS, (OZEMPIC, 0.25 OR 0.5 MG/DOSE,) 2 MG/1.5ML SOPN, Inject 0.5 mg into the skin once a week., Disp: 3 pen, Rfl: 1   sodium chloride (OCEAN) 0.65 %  SOLN nasal spray, Place 1 spray into both nostrils as needed for congestion., Disp: , Rfl:    Spacer/Aero-Holding Chambers (AEROCHAMBER MV) inhaler, Use as instructed, Disp: 1 each, Rfl: 0      Objective:   Vitals:   02/10/19 1425  BP: 118/70  Pulse: (!) 104  Temp: (!) 97.3 F (36.3 C)  TempSrc: Skin  SpO2: 94%  Weight: 269 lb 3.2 oz (122.1 kg)  Height: 5' 4" (1.626 m)    Estimated body mass index is 46.21 kg/m as calculated from the following:   Height as of this encounter: 5' 4" (1.626 m).   Weight as of this encounter: 269 lb 3.2 oz (122.1 kg).  _0 @  [employer]   02/10/19 1425  Weight: 269 lb 3.2 oz (122.1 kg)     Physical Exam  General Appearance:    Alert, cooperative, no distress, appears stated age - yes , Deconditioned looking - yes , OBESE  - yes, Sitting on Wheelchair -  yes  Head:    Normocephalic, without obvious abnormality, atraumatic  Eyes:    PERRL, conjunctiva/corneas clear,  Ears:    Normal TM's and external ear canals, both ears  Nose:   Nares normal, septum midline, mucosa normal, no drainage    or sinus tenderness. OXYGEN ON  - no . Patient is @ ra   Throat:   Lips, mucosa, and tongue normal; teeth and gums normal. Cyanosis on lips - no  Neck:   Supple, symmetrical, trachea midline, no adenopathy;    thyroid:  no enlargement/tenderness/nodules; no carotid   bruit or JVD  Back:     Symmetric, no curvature, ROM normal, no CVA tenderness  Lungs:     Distress - no , Wheeze no, Barrell Chest - no, Purse lip breathing - no, Crackles - mild at base   Chest Wall:    No tenderness or deformity.    Heart:    Regular rate and rhythm,  S1 and S2 normal, no rub   or gallop, Murmur - no  Breast Exam:    NOT DONE  Abdomen:     Soft, non-tender, bowel sounds active all four quadrants,    no masses, no organomegaly. Visceral obesity - yes  Genitalia:   NOT DONE  Rectal:   NOT DONE  Extremities:   Extremities - normal, Has Cane - yes and uses  walker too, Clubbing - no, Edema - yes chronic  Pulses:   2+ and symmetric all extremities  Skin:   Stigmata of Connective Tissue Disease - mild RA +  Lymph nodes:   Cervical, supraclavicular, and axillary nodes normal  Psychiatric:  Neurologic:   Pleasant - yes, Anxious - n, Flat affect - mild yes  CAm-ICU - neg, Alert and Oriented x 3 - yes, Moves all 4s - yes, Speech - normal, Cognition - intact           Assessment:       ICD-10-CM   1. ILD (interstitial lung disease) ([place])  J84.9   2. Bronchiectasis without complication ([place])  I17.9   3. History of snoring  Z87.898   4. History of DVT of lower extremity  Z86.718   5. Dyspnea on exertion  R06.09 D-dimer, quantitative (not at [facility])       Plan:     Patient Instructions  ILD (interstitial lung disease) ([place]) Bronchiectasis without complication ([place])  - stable and per Dr B [facility]  History of snoring  - refer Dr Gala Murdoch for sleep apnea eval  History of DVT of lower extremity - Jan 2020   - check d-dimer 02/10/2019 and discuss with your cardiology the management   Dyspnea on exertion  - due to above and weight and other issues  - you do not qualify for portable o2 but can buy on own through eg: [employer] - get details from RN  Followup  - 6 to 9 months or sooner    > 50% of this > 25 min visit spent in face to face counseling or coordination of care - by this undersigned MD - Dr Brand Males. This includes one or more of the following documented above: discussion of test results, diagnostic or treatment recommendations, prognosis, risks and benefits of management options, instructions, education, compliance or risk-factor reduction    SIGNATURE    Dr. Brand Males, M.D., F.C.C.P,  Pulmonary and Critical Care Medicine Staff Physician, [place] Director - Interstitial Lung Disease  Program  Pulmonary [place] [facility] [place], [place],  [address]  Pager: [phone], If no answer or between  15:00h - 7:00h: call [phone] Telephone: [phone]  2:55 PM 02/10/2019

## 2019-02-11 ENCOUNTER — Telehealth: Payer: Self-pay | Admitting: Cardiovascular Disease

## 2019-02-11 ENCOUNTER — Telehealth: Payer: Self-pay | Admitting: Internal Medicine

## 2019-02-11 NOTE — Telephone Encounter (Signed)
Patient wanted to know if she still needed to be taking Eliquis, or if she could stop taking it.  She went to see her Rheumatologist [name] yesterday, and [name] did a D-Dimera test, and the results came back normal. With normal results, [name] thinks the patient may be able to come off of her Eliquis. Please let the patient know what Dr. Gwenlyn Found thinks about stopping the medication

## 2019-02-11 NOTE — Telephone Encounter (Signed)
D-dimer normal - she should pass this information to her cardiologist of whoever is monitoring her anticoagulation

## 2019-02-11 NOTE — Telephone Encounter (Signed)
Called and spoke w/ pt regarding MR's results. Pt verbalized understanding with no additional questions. Nothing further needed at this time.

## 2019-02-11 NOTE — Telephone Encounter (Signed)
Spoke with pt, aware will forward information to dr berry to review and advise.

## 2019-02-14 ENCOUNTER — Telehealth: Payer: Self-pay | Admitting: Internal Medicine

## 2019-02-14 DIAGNOSIS — J849 Interstitial pulmonary disease, unspecified: Secondary | ICD-10-CM

## 2019-02-14 NOTE — Telephone Encounter (Signed)
Call returned to patient, she states she needs a order to cancel her portable oxygen. She reports at last OV she was told she no longer needs oxygen during the day time. She only needs it at night. I made her aware I would touch bases with MR and get back with her.   MR please advise if okay to cancel portable oxygen? I do not see any mention of continuing oxygen at night in AVS. Should patient continue oxygen at night? Thanks.

## 2019-02-14 NOTE — Telephone Encounter (Signed)
Called the patient to let her know Dr. Golden Pop response. Patient voiced understanding. Order placed to discontinue the portable O2.  Nothing further needed at this time.

## 2019-02-14 NOTE — Telephone Encounter (Signed)
Yes continue o2 at night Ok to dc portable o2

## 2019-02-17 ENCOUNTER — Ambulatory Visit (INDEPENDENT_AMBULATORY_CARE_PROVIDER_SITE_OTHER): Payer: Medicare Other

## 2019-02-17 ENCOUNTER — Encounter: Payer: Self-pay | Admitting: Podiatry

## 2019-02-17 ENCOUNTER — Ambulatory Visit (INDEPENDENT_AMBULATORY_CARE_PROVIDER_SITE_OTHER): Payer: Medicare Other | Admitting: Podiatry

## 2019-02-17 ENCOUNTER — Other Ambulatory Visit: Payer: Self-pay | Admitting: Podiatry

## 2019-02-17 ENCOUNTER — Other Ambulatory Visit: Payer: Self-pay

## 2019-02-17 DIAGNOSIS — B351 Tinea unguium: Secondary | ICD-10-CM

## 2019-02-17 DIAGNOSIS — M79676 Pain in unspecified toe(s): Secondary | ICD-10-CM

## 2019-02-17 DIAGNOSIS — M25572 Pain in left ankle and joints of left foot: Secondary | ICD-10-CM

## 2019-02-17 DIAGNOSIS — M779 Enthesopathy, unspecified: Secondary | ICD-10-CM

## 2019-02-17 DIAGNOSIS — M25571 Pain in right ankle and joints of right foot: Secondary | ICD-10-CM

## 2019-02-17 DIAGNOSIS — M1000 Idiopathic gout, unspecified site: Secondary | ICD-10-CM

## 2019-02-17 DIAGNOSIS — G8929 Other chronic pain: Secondary | ICD-10-CM | POA: Diagnosis not present

## 2019-02-17 DIAGNOSIS — D689 Coagulation defect, unspecified: Secondary | ICD-10-CM | POA: Diagnosis not present

## 2019-02-17 NOTE — Progress Notes (Signed)
Subjective:   Patient ID: [name], female   DOB: 78 y.o.   MRN: [id number]   HPI Patient presents stating she gets a lot of pain in the ankles of both feet and states that her nails have gotten thickened and she cannot cut them with obesity is complicating factor.  Patient came in a wheelchair today and states it is been bad over the last few weeks.  Patient does not smoke and is not currently active   Review of Systems  All other systems reviewed and are negative.       Objective:  Physical Exam Vitals signs and nursing note reviewed.  Constitutional:      Appearance: She is well-developed.  Pulmonary:     Effort: Pulmonary effort is normal.  Musculoskeletal: Normal range of motion.  Skin:    General: Skin is warm.  Neurological:     Mental Status: She is alert.     Neurovascular status was found to be intact with moderate varicosities noted bilateral and edema of the ankle with exquisite discomfort in the sinus tarsi bilateral with thickened nailbeds 1-5 both feet that are yellow brittle and painful when palpated.  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  Reappoint as symptoms indicate  X-rays indicate arthritis of the ankle and moderate osteoporosis body lateral with flatfoot deformity bilateral

## 2019-02-17 NOTE — Patient Instructions (Signed)
Low-Purine Eating Plan A low-purine eating plan involves making food choices to limit your intake of purine. Purine is a kind of uric acid. Too much uric acid in your blood can cause certain conditions, such as gout and kidney stones. Eating a low-purine diet can help control these conditions. What are tips for following this plan? Reading food labels   Avoid foods with saturated or Trans fat.  Check the ingredient list of grains-based foods, such as bread and cereal, to make sure that they contain whole grains.  Check the ingredient list of sauces or soups to make sure they do not contain meat or fish.  When choosing soft drinks, check the ingredient list to make sure they do not contain high-fructose corn syrup. Shopping  Buy plenty of fresh fruits and vegetables.  Avoid buying canned or fresh fish.  Buy dairy products labeled as low-fat or nonfat.  Avoid buying premade or processed foods. These foods are often high in fat, salt (sodium), and added sugar. Cooking  Use olive oil instead of butter when cooking. Oils like olive oil, canola oil, and sunflower oil contain healthy fats. Meal planning  Learn which foods do or do not affect you. If you find out that a food tends to cause your gout symptoms to flare up, avoid eating that food. You can enjoy foods that do not cause problems. If you have any questions about a food item, talk with your dietitian or health care provider.  Limit foods high in fat, especially saturated fat. Fat makes it harder for your body to get rid of uric acid.  Choose foods that are lower in fat and are lean sources of protein. General guidelines  Limit alcohol intake to no more than 1 drink a day for nonpregnant women and 2 drinks a day for men. One drink equals 12 oz of beer, 5 oz of wine, or 1 oz of hard liquor. Alcohol can affect the way your body gets rid of uric acid.  Drink plenty of water to keep your urine clear or pale yellow. Fluids can help  remove uric acid from your body.  If directed by your health care provider, take a vitamin C supplement.  Work with your health care provider and dietitian to develop a plan to achieve or maintain a healthy weight. Losing weight can help reduce uric acid in your blood. What foods are recommended? The items listed may not be a complete list. Talk with your dietitian about what dietary choices are best for you. Foods low in purines Foods low in purines do not need to be limited. These include:  All fruits.  All low-purine vegetables, pickles, and olives.  Breads, pasta, rice, cornbread, and popcorn. Cake and other baked goods.  All dairy foods.  Eggs, nuts, and nut butters.  Spices and condiments, such as salt, herbs, and vinegar.  Plant oils, butter, and margarine.  Water, sugar-free soft drinks, tea, coffee, and cocoa.  Vegetable-based soups, broths, sauces, and gravies. Foods moderate in purines Foods moderate in purines should be limited to the amounts listed.   cup of asparagus, cauliflower, spinach, mushrooms, or green peas, each day.  2/3 cup uncooked oatmeal, each day.   cup dry wheat bran or wheat germ, each day.  2-3 ounces of meat or poultry, each day.  4-6 ounces of shellfish, such as crab, lobster, oysters, or shrimp, each day.  1 cup cooked beans, peas, or lentils, each day.  Soup, broths, or bouillon made from meat or  fish. Limit these foods as much as possible. What foods are not recommended? The items listed may not be a complete list. Talk with your dietitian about what dietary choices are best for you. Limit your intake of foods high in purines, including:  Beer and other alcohol.  Meat-based gravy or sauce.  Canned or fresh fish, such as: ? Anchovies, sardines, herring, and tuna. ? Mussels and scallops. ? Codfish, trout, and haddock.  Berniece Salines.  Organ meats, such as: ? Liver or kidney. ? Tripe. ? Sweetbreads (thymus gland or pancreas).   Wild Clinical biochemist.  Yeast or yeast extract supplements.  Drinks sweetened with high-fructose corn syrup. Summary  Eating a low-purine diet can help control conditions caused by too much uric acid in the body, such as gout or kidney stones.  Choose low-purine foods, limit alcohol, and limit foods high in fat.  You will learn over time which foods do or do not affect you. If you find out that a food tends to cause your gout symptoms to flare up, avoid eating that food. This information is not intended to replace advice given to you by your health care provider. Make sure you discuss any questions you have with your health care provider. Document Released: 08/30/2010 Document Revised: 04/17/2017 Document Reviewed: 06/18/2016 Elsevier Patient Education  2020 [employer].

## 2019-02-23 ENCOUNTER — Ambulatory Visit: Payer: Medicare Other | Admitting: Pulmonary Disease

## 2019-02-24 NOTE — Telephone Encounter (Signed)
Spoke with pt, aware of dr berry's recommendations.

## 2019-02-24 NOTE — Telephone Encounter (Signed)
I do not see a good reason for her to be on oral anticoagulation.

## 2019-03-07 ENCOUNTER — Telehealth: Payer: Self-pay | Admitting: Pulmonary Disease

## 2019-03-07 NOTE — Telephone Encounter (Signed)
ATC [employer].  LMTCB 03/08/19, because it is after 5pm.  Patient was seen by [name] 02/10/19.  Patinet was referred and  is scheduled a sleep consult 03/16/19 with Dr. Ander Slade. Patient has no ONO order, so no results are available for [employer]. 02/14/19 phone encounter has POC discontinue, but continue O2 at night.  Will need follow up 03/08/19

## 2019-03-08 NOTE — Telephone Encounter (Signed)
Message routed to Dr.Olalere as the patient has scheduled appointment for evaluation on 03/16/19.  Dr.Olalere, this has been forwarded to you as an [place]. Not sure if you wanted to wait until the patient has been seen by you on 03/16/19 before ordering the ONO.

## 2019-03-10 ENCOUNTER — Telehealth: Payer: Self-pay | Admitting: Nurse Practitioner

## 2019-03-10 DIAGNOSIS — M069 Rheumatoid arthritis, unspecified: Secondary | ICD-10-CM | POA: Diagnosis not present

## 2019-03-10 DIAGNOSIS — H52203 Unspecified astigmatism, bilateral: Secondary | ICD-10-CM | POA: Diagnosis not present

## 2019-03-10 DIAGNOSIS — E119 Type 2 diabetes mellitus without complications: Secondary | ICD-10-CM | POA: Diagnosis not present

## 2019-03-10 DIAGNOSIS — Z79899 Other long term (current) drug therapy: Secondary | ICD-10-CM | POA: Diagnosis not present

## 2019-03-10 LAB — HM DIABETES EYE EXAM

## 2019-03-10 NOTE — Telephone Encounter (Signed)
Spoke with patient and have scheduled a Telehealth Zoom Palliative visit with NP for 03/29/19 @ 12:30 PM.

## 2019-03-14 ENCOUNTER — Encounter: Payer: Self-pay | Admitting: Family Medicine

## 2019-03-14 NOTE — Telephone Encounter (Signed)
I will see her before the overnight oximetry

## 2019-03-14 NOTE — Telephone Encounter (Signed)
Dr. Ander Slade - please advise. Thanks.

## 2019-03-14 NOTE — Telephone Encounter (Signed)
Called and spoke to [employer].  Patient will need to have an ONO ordered to continue night time oxygen since the daytime oxygen was d/c'd.  Advised [employer] that patient has sleep consult scheduled on 03/16/2019.  Nothing further needed at this time.

## 2019-03-16 ENCOUNTER — Encounter: Payer: Self-pay | Admitting: Pulmonary Disease

## 2019-03-16 ENCOUNTER — Ambulatory Visit (INDEPENDENT_AMBULATORY_CARE_PROVIDER_SITE_OTHER): Payer: Medicare Other | Admitting: Pulmonary Disease

## 2019-03-16 ENCOUNTER — Other Ambulatory Visit: Payer: Self-pay

## 2019-03-16 VITALS — BP 120/74 | HR 90 | Temp 97.2°F | Ht 64.0 in | Wt 266.8 lb

## 2019-03-16 DIAGNOSIS — G4733 Obstructive sleep apnea (adult) (pediatric): Secondary | ICD-10-CM | POA: Diagnosis not present

## 2019-03-16 NOTE — Progress Notes (Signed)
[name]    [id number]    [birth year]  Primary Care Physician:Banks, Langley Adie, MD  Referring Physician: [name], [address] [place],  [place] [address]  Chief complaint:   Patient being evaluated for possible obstructive sleep apnea  HPI:  History of snoring, daytime fatigue Admits to snoring, denies witnessed apneas Usually goes to bed by about 9 PM Able to fall asleep easily Wakes up about 2-3 times during the night to use the potty She usually feels restored whenever she wakes up in the morning  She may take a nap usually about 4 PM for about an hour, she feels rejuvenated following the lab  Admits to dryness of the mouth-this is around-the-clock Denies morning headaches No night sweats  Memory is sluggish but improving Did have gamma knife surgery for a meningioma about 2 years ago She got very deconditioned following that but she is improving  Patient does have a background history of interstitial lung disease, rheumatoid arthritis, history of congestive heart failure History of rheumatoid arthritis  Never smoker No family history of sleep disordered breathing  Outpatient Encounter Medications as of 03/16/2019  Medication Sig  . acetaminophen (TYLENOL) 500 MG tablet Take 1,000 mg by mouth every 6 (six) hours as needed for moderate pain.   [name] albuterol (PROVENTIL HFA;VENTOLIN HFA) 108 (90 BASE) MCG/ACT inhaler Inhale 2 puffs into the lungs every 6 (six) hours as needed for wheezing or shortness of breath.  . allopurinol (ZYLOPRIM) 300 MG tablet Take 300 mg by mouth daily.  [name] apixaban (ELIQUIS) 5 MG TABS tablet Take 1 tablet (5 mg total) by mouth 2 (two) times daily.  [name] atorvastatin (LIPITOR) 20 MG tablet Take 10 mg by mouth every evening.   [name] azithromycin (ZITHROMAX) 500 MG tablet Take 1 tablet by mouth daily. TAKE ON MON, WED & FRI  . Calcium Carb-Cholecalciferol (CALCIUM + D3 PO) Take 2 tablets by mouth daily with lunch.   .  cholecalciferol (VITAMIN D) 1000 UNITS tablet Take 2,000 Units by mouth daily with lunch.   . colchicine 0.6 MG tablet Take 0.6 mg by mouth daily.  . cyclobenzaprine (FLEXERIL) 5 MG tablet Take 1 tablet (5 mg total) by mouth at bedtime as needed for muscle spasms.  . diclofenac sodium (VOLTAREN) 1 % GEL Apply 2 g topically 3 (three) times daily. Both hands and knees  . docusate sodium (COLACE) 100 MG capsule Take 1 capsule (100 mg total) by mouth 2 (two) times daily.  . DULoxetine (CYMBALTA) 60 MG capsule Take 1 capsule (60 mg total) by mouth daily.  . fluticasone (FLONASE) 50 MCG/ACT nasal spray Place 2 sprays into both nostrils daily.  . furosemide (LASIX) 20 MG tablet Take 3 tablets (60 mg total) by mouth daily.  [name] gabapentin (NEURONTIN) 600 MG tablet Take two in the morning , Two mid- afternoon and two at bedtime  . guaiFENesin (MUCINEX) 600 MG 12 hr tablet Take 1,200 mg by mouth 2 (two) times daily.  . hydroxychloroquine (PLAQUENIL) 200 MG tablet Take 400 mg by mouth daily.   [name] ipratropium-albuterol (DUONEB) 0.5-2.5 (3) MG/3ML SOLN Take 3 mLs by nebulization every 6 (six) hours as needed (cough/wheezing).   [name] leflunomide (ARAVA) 10 MG tablet Take 10 mg by mouth daily.  [name] levothyroxine (SYNTHROID) 50 MCG tablet Take 1 tablet (50 mcg total) by mouth daily.  [name] linaclotide (LINZESS) 72 MCG capsule Take 1 capsule (72 mcg total) by mouth daily  before breakfast.  . metoprolol succinate (TOPROL XL) 100 MG 24 hr tablet Take 1/2 tablet by mouth daily with or immediately following a meal.  . morphine (MS CONTIN) 15 MG 12 hr tablet Take 1 tablet (15 mg total) by mouth every 12 (twelve) hours. Do Not Fill Before `03/24/2019  . morphine (MSIR) 15 MG tablet Take 1 tablet (15 mg total) by mouth 2 (two) times daily as needed for moderate pain ([facility] Pain).  . Multiple Vitamin (MULTIVITAMIN WITH MINERALS) TABS Take 1 tablet by mouth daily.   [name] olmesartan (BENICAR) 40 MG tablet Take 1 tablet (40 mg  total) by mouth daily.  . OXYGEN-HELIUM IN Inhale 2 L into the lungs at bedtime. And on exertion  . pantoprazole (PROTONIX) 40 MG tablet take 1 tablet by mouth once daily MINUTES BEFORE 1ST MEAL OF THE DAY  . potassium chloride (K-DUR) 10 MEQ tablet Take 1 tablet (10 mEq total) by mouth daily.  . predniSONE (DELTASONE) 5 MG tablet Take 10 mg by mouth every morning.   . Semaglutide,0.25 or 0.5MG/DOS, (OZEMPIC, 0.25 OR 0.5 MG/DOSE,) 2 MG/1.5ML SOPN Inject 0.5 mg into the skin once a week.  . sodium chloride (OCEAN) 0.65 % SOLN nasal spray Place 1 spray into both nostrils as needed for congestion.  [name] Spacer/Aero-Holding Chambers (AEROCHAMBER MV) inhaler Use as instructed   No facility-administered encounter medications on file as of 03/16/2019.     Allergies as of 03/16/2019 - Review Complete 03/16/2019  Allergen Reaction Noted  . Codeine Swelling and Other (See Comments) 09/11/2011  . Infliximab Anaphylaxis 09/11/2011  . Lisinopril Swelling 09/11/2011    Past Medical History:  Diagnosis Date  . Acute on chronic diastolic (congestive) heart failure ([facility])   . Anemia    iron deficiency anemia - secondary to blood loss ( chronic)   . Arthritis    endstage changes bilateral knees/bilateral ankles.   . Asthma   . Carotid artery occlusion   . Chronic fatigue   . Chronic kidney disease   . Closed left hip fracture ([facility])   . Clotting disorder ([place])    pt denies this  . Contusion of left knee    due to fall 1/14.  [name] COPD (chronic obstructive pulmonary disease) ([facility])    pulmonary fibrosis  . Depression, reactive   . Diabetes mellitus    type II   . Diastolic dysfunction   . Difficulty in walking   . Family history of heart disease   . Generalized muscle weakness   . Gout   . High cholesterol   . History of falling   . Hypertension   . Hypothyroidism   . Interstitial lung disease ([facility] [place])   . Meningioma of left sphenoid wing involving cavernous sinus ([facility]) 02/17/2012   Continue  diplopia, left eye pain and left headaches.    . Morbid obesity ([place])   . Morbid obesity ([facility])   . Neuromuscular disorder ([place])    diabetic neuropathy   . Normal coronary arteries    cardiac catheterization performed  10/31/14  . RA (rheumatoid arthritis) ([facility])    has been off methotreaxte since 10/13.  [name] Rheumatoid arthritis ([facility])   . Shortness of breath   . Spinal stenosis of lumbar region   . Thyroid disease   . Unspecified lack of coordination   . URI (upper respiratory infection)     Past Surgical History:  Procedure Laterality Date  . ABDOMINAL HYSTERECTOMY    . BRAIN SURGERY  Gamma knife 10/13. Needs repeat spring  '14  . CARDIAC CATHETERIZATION N/A 10/31/2014   Procedure: Right/Left Heart Cath and Coronary Angiography;  Surgeon: [name], MD;  Location: [facility];  Service: Cardiovascular;  Laterality: N/A;  . ESOPHAGOGASTRODUODENOSCOPY (EGD) WITH PROPOFOL N/A 09/14/2014   Procedure: ESOPHAGOGASTRODUODENOSCOPY (EGD) WITH PROPOFOL;  Surgeon: [name], MD;  Location: [facility];  Service: Endoscopy;  Laterality: N/A;  . INCISION AND DRAINAGE HIP Left 01/16/2017   Procedure: IRRIGATION AND DEBRIDEMENT LEFT HIP;  Surgeon: [name], MD;  Location: [facility];  Service: Orthopedics;  Laterality: Left;  . INTRAMEDULLARY (IM) NAIL INTERTROCHANTERIC Left 11/29/2016   Procedure: INTRAMEDULLARY (IM) NAIL INTERTROCHANTRIC;  Surgeon: [name], MD;  Location: [facility];  Service: Orthopedics;  Laterality: Left;  . OVARY SURGERY    . SHOULDER SURGERY Left   . TONSILLECTOMY  age 14  . VIDEO BRONCHOSCOPY Bilateral 05/31/2013   Procedure: VIDEO BRONCHOSCOPY WITHOUT FLUORO;  Surgeon: Brand Males, MD;  Location: [facility];  Service: Cardiopulmonary;  Laterality: Bilateral;  . video bronscoscopy  2000   lung    Family History  Problem Relation Age of Onset  . Diabetes Mother   . Heart attack Mother   . Hypertension Father   . Lung  cancer Father   . Diabetes Sister   . Diabetes Brother   . Hypertension Brother   . Heart disease Brother   . Heart attack Brother   . Kidney cancer Brother   . Uterine cancer Daughter   . Breast cancer Sister   . Rheum arthritis Maternal Uncle   . Gout Brother   . Kidney failure Brother        x 5    Social History   Socioeconomic History  . Marital status: Married    Spouse name: Not on file  . Number of children: 6  . Years of education: college  . Highest education level: Not on file  Occupational History  . Occupation: retired Ship broker  . Financial resource strain: Not on file  . Food insecurity    Worry: Not on file    Inability: Not on file  . Transportation needs    Medical: Not on file    Non-medical: Not on file  Tobacco Use  . Smoking status: Never Smoker  . Smokeless tobacco: Never Used  Substance and Sexual Activity  . Alcohol use: No  . Drug use: No  . Sexual activity: Not Currently  Lifestyle  . Physical activity    Days per week: Not on file    Minutes per session: Not on file  . Stress: Not on file  Relationships  . Social Herbalist on phone: Not on file    Gets together: Not on file    Attends religious service: Not on file    Active member of club or organization: Not on file    Attends meetings of clubs or organizations: Not on file    Relationship status: Not on file  . Intimate partner violence    Fear of current or ex partner: Not on file    Emotionally abused: Not on file    Physically abused: Not on file    Forced sexual activity: Not on file  Other Topics Concern  . Not on file  Social History Narrative   Patient consumes 2-3 cups coffee per day.    Review of Systems  Vitals:   03/16/19 1343  BP:  120/74  Pulse: 90  Temp: (!) 97.2 F (36.2 C)  SpO2: 93%     Physical Exam  Constitutional: She is oriented to person, place, and time.  Wheelchair-borne, obese  HENT:  Head: Normocephalic and  atraumatic.  Crowded oropharynx  Eyes: Pupils are equal, round, and reactive to light. Right eye exhibits no discharge. Left eye exhibits no discharge.  Neck: Normal range of motion. Neck supple. No tracheal deviation present. No thyromegaly present.  Neck is thick  Cardiovascular: Normal rate and regular rhythm. Exam reveals no friction rub.  No murmur heard. Pulmonary/Chest: Effort normal and breath sounds normal. No respiratory distress. She has no wheezes. She exhibits no tenderness.  Abdominal: Soft. She exhibits no distension.  Musculoskeletal:        General: Edema present.  Neurological: She is alert and oriented to person, place, and time. No cranial nerve deficit.  Skin: Skin is warm and dry. No erythema.  Psychiatric: She has a normal mood and affect.   Results of the Epworth flowsheet 03/16/2019  Sitting and reading 1  Watching TV 2  Sitting, inactive in a public place (e.g. a theatre or a meeting) 1  As a passenger in a car for an hour without a break 2  Lying down to rest in the afternoon when circumstances permit 3  Sitting and talking to someone 0  Sitting quietly after a lunch without alcohol 2  In a car, while stopped for a few minutes in traffic 0  Total score 11   Data Reviewed: CT scan of the chest from 11/24/2018 was reviewed showing evidence of fibrosis, bronchiectasis  Assessment:  .  Interstitial lung disease  .  Moderate probability of significant obstructive sleep apnea -Daytime fatigue, obesity, crowded oropharynx, history of snoring  .  History of congestive heart failure  .  Possible significant nocturnal desaturations with interstitial lung disease  .  Pathophysiology of sleep disordered breathing discussed with the patient .  Treatment options for sleep disordered breathing discussed with the patient Plan/Recommendations:  .  We will schedule the patient for home sleep study  .  I will see her following initiation of CPAP therapy if needed   .  Encouraged to continue graded exercises, improve weight loss efforts  .  Get enough hours of sleep at night  .  I will follow-up in 3 months   .  She is known to the practice, follows up with Dr. . Over 60% of the visit was spent face-to-face consultation with patient, probability, treatment options, risks of inadequate of poorly treated sleep disordered breathing discussed extensively  Sherrilyn Rist MD  Pulmonary and Critical Care 03/16/2019, 2:05 PM  CC: [name], MD

## 2019-03-16 NOTE — Patient Instructions (Signed)
Possible obstructive sleep apnea  We will schedule you for home sleep study  Options of treatment as discussed  We will contact you after we review the study  We will see you back in the office in 3 months  Call with significant concerns Sleep Apnea Sleep apnea is a condition in which breathing pauses or becomes shallow during sleep. Episodes of sleep apnea usually last 10 seconds or longer, and they may occur as many as 20 times an hour. Sleep apnea disrupts your sleep and keeps your body from getting the rest that it needs. This condition can increase your risk of certain health problems, including:  Heart attack.  Stroke.  Obesity.  Diabetes.  Heart failure.  Irregular heartbeat. What are the causes? There are three kinds of sleep apnea:  Obstructive sleep apnea. This kind is caused by a blocked or collapsed airway.  Central sleep apnea. This kind happens when the part of the brain that controls breathing does not send the correct signals to the muscles that control breathing.  Mixed sleep apnea. This is a combination of obstructive and central sleep apnea. The most common cause of this condition is a collapsed or blocked airway. An airway can collapse or become blocked if:  Your throat muscles are abnormally relaxed.  Your tongue and tonsils are larger than normal.  You are overweight.  Your airway is smaller than normal. What increases the risk? You are more likely to develop this condition if you:  Are overweight.  Smoke.  Have a smaller than normal airway.  Are elderly.  Are female.  Drink alcohol.  Take sedatives or tranquilizers.  Have a family history of sleep apnea. What are the signs or symptoms? Symptoms of this condition include:  Trouble staying asleep.  Daytime sleepiness and tiredness.  Irritability.  Loud snoring.  Morning headaches.  Trouble concentrating.  Forgetfulness.  Decreased interest in sex.  Unexplained  sleepiness.  Mood swings.  Personality changes.  Feelings of depression.  Waking up often during the night to urinate.  Dry mouth.  Sore throat. How is this diagnosed? This condition may be diagnosed with:  A medical history.  A physical exam.  A series of tests that are done while you are sleeping (sleep study). These tests are usually done in a sleep lab, but they may also be done at home. How is this treated? Treatment for this condition aims to restore normal breathing and to ease symptoms during sleep. It may involve managing health issues that can affect breathing, such as high blood pressure or obesity. Treatment may include:  Sleeping on your side.  Using a decongestant if you have nasal congestion.  Avoiding the use of depressants, including alcohol, sedatives, and narcotics.  Losing weight if you are overweight.  Making changes to your diet.  Quitting smoking.  Using a device to open your airway while you sleep, such as: ? An oral appliance. This is a custom-made mouthpiece that shifts your lower jaw forward. ? A continuous positive airway pressure (CPAP) device. This device blows air through a mask when you breathe out (exhale). ? A nasal expiratory positive airway pressure (EPAP) device. This device has valves that you put into each nostril. ? A bi-level positive airway pressure (BPAP) device. This device blows air through a mask when you breathe in (inhale) and breathe out (exhale).  Having surgery if other treatments do not work. During surgery, excess tissue is removed to create a wider airway. It is important to get  treatment for sleep apnea. Without treatment, this condition can lead to:  High blood pressure.  Coronary artery disease.  In men, an inability to achieve or maintain an erection (impotence).  Reduced thinking abilities. Follow these instructions at home: Lifestyle  Make any lifestyle changes that your health care provider recommends.   Eat a healthy, well-balanced diet.  Take steps to lose weight if you are overweight.  Avoid using depressants, including alcohol, sedatives, and narcotics.  Do not use any products that contain nicotine or tobacco, such as cigarettes, e-cigarettes, and chewing tobacco. If you need help quitting, ask your health care provider. General instructions  Take over-the-counter and prescription medicines only as told by your health care provider.  If you were given a device to open your airway while you sleep, use it only as told by your health care provider.  If you are having surgery, make sure to tell your health care provider you have sleep apnea. You may need to bring your device with you.  Keep all follow-up visits as told by your health care provider. This is important. Contact a health care provider if:  The device that you received to open your airway during sleep is uncomfortable or does not seem to be working.  Your symptoms do not improve.  Your symptoms get worse. Get help right away if:  You develop: ? Chest pain. ? Shortness of breath. ? Discomfort in your back, arms, or stomach.  You have: ? Trouble speaking. ? Weakness on one side of your body. ? Drooping in your face. These symptoms may represent a serious problem that is an emergency. Do not wait to see if the symptoms will go away. Get medical help right away. Call your local emergency services (911 in the [place] Do not drive yourself to the hospital. Summary  Sleep apnea is a condition in which breathing pauses or becomes shallow during sleep.  The most common cause is a collapsed or blocked airway.  The goal of treatment is to restore normal breathing and to ease symptoms during sleep. This information is not intended to replace advice given to you by your health care provider. Make sure you discuss any questions you have with your health care provider. Document Released: 04/25/2002 Document Revised:  02/19/2018 Document Reviewed: 12/29/2017 Elsevier Patient Education  2020 [employer].

## 2019-03-17 ENCOUNTER — Other Ambulatory Visit: Payer: Self-pay | Admitting: Family Medicine

## 2019-03-17 MED ORDER — FUROSEMIDE 20 MG PO TABS: 60.0000 mg | ORAL_TABLET | Freq: Every day | ORAL | 1 refills | Status: DC

## 2019-03-17 NOTE — Telephone Encounter (Signed)
Medication Refill - Medication: furosemide (LASIX) 20 MG tablet [[id number]]     Preferred Pharmacy (with phone number or street name):  [facility] - [place], [place] - [address]  [address] [place] [address]  Phone: [phone] Fax: [phone]     Agent: Please be advised that RX refills may take up to 3 business days. We ask that you follow-up with your pharmacy.

## 2019-03-18 ENCOUNTER — Other Ambulatory Visit: Payer: Self-pay

## 2019-03-18 MED ORDER — FUROSEMIDE 20 MG PO TABS: 60.0000 mg | ORAL_TABLET | Freq: Every day | ORAL | 1 refills | Status: DC

## 2019-03-18 NOTE — Telephone Encounter (Signed)
Rx sent 

## 2019-03-23 DIAGNOSIS — Z79899 Other long term (current) drug therapy: Secondary | ICD-10-CM | POA: Diagnosis not present

## 2019-03-23 DIAGNOSIS — M1A00X0 Idiopathic chronic gout, unspecified site, without tophus (tophi): Secondary | ICD-10-CM | POA: Diagnosis not present

## 2019-03-23 DIAGNOSIS — M0579 Rheumatoid arthritis with rheumatoid factor of multiple sites without organ or systems involvement: Secondary | ICD-10-CM | POA: Diagnosis not present

## 2019-03-29 ENCOUNTER — Other Ambulatory Visit: Payer: Medicare Other | Admitting: Nurse Practitioner

## 2019-03-29 ENCOUNTER — Other Ambulatory Visit: Payer: Self-pay

## 2019-03-29 DIAGNOSIS — Z515 Encounter for palliative care: Secondary | ICD-10-CM | POA: Diagnosis not present

## 2019-03-29 DIAGNOSIS — M069 Rheumatoid arthritis, unspecified: Secondary | ICD-10-CM

## 2019-03-29 NOTE — Progress Notes (Signed)
[facility] Consult Note Telephone: [phone]  Fax: [phone]  PATIENT NAME: [name] DOB: 11/06/1940 MRN: [id number]  PRIMARY CARE PROVIDER:   [name], MD  REFERRING PROVIDER:  [name], MD [place],  [place] [address]  RESPONSIBLE PARTY:   Self  Due to the COVID-19 crisis, this visit was done via telemedicine from my office and it was initiated and consent by this patient and or family.  I was asked by Dr Volanda Napoleon to see Ms.Cottrill for Palliative care consult for goc  RECOMMENDATIONS and PLAN:  1. ACP: full code currently with aggressive interventions. Ms. Hoelzel agreeable to have blank most form and hard choice book mailed for review and further discussion on goc, code status at next pc visit.   2. RA; continue current regimen, pain management  3. Palliative care encounter Palliative medicine team will continue to support patient, patient's family, and medical team. Visit consisted of counseling and education dealing with the complex and emotionally intense issues of symptom management and palliative care in the setting of serious and potentially life-threatening illness  I spent 65 minutes providing this consultation,  From 12:15pm to 1:20pm. More than 50% of the time in this consultation was spent coordinating communication.   HISTORY OF PRESENT ILLNESS:  [name] is a 78 y.o. year old female with multiple medical problems including Chronic diastolic congestive heart failure, pulmonary fibrosis, interstitial lung disease, chronic respiratory failure with hypoxia, dysphasia, aphasia, carotid artery occlusion, COPD,  history of DVT, chronic kidney disease, Rheumatoid arthritis, anemia, meningioma of left sphenoid wing involving cavernous sinus, hypothyroidism, hypertension, gerd, spinal stenosis of lumbar region,  osteoarthritis, hypercholesterolemia, gout, morbid obesity, arthritis,  cataract,  chronic pain, history of left hip fracture s/p nail, tonsillectomy, left shoulder surgery, ovarian surgery, brain surgery, abdominal hysterectomy. She was seen by  critical care [name] on 02/10/2019 for Severe class 3 dyspnea with hypoxia on account of BMI 50, diastolic dysfunction, chronic pain, deconditioning, intenstitial lung disease due to rheumatoid arthritis on celcept, prednisone, Bactrim. Pulmonary fibrosis by basilar predominant reticular opacities that have not changed from 2014 to 2017. Rheumatoid arthritis has been treated with Methotrexate, Biologicals. Seen by Critical Care on 10/28 / 2020 for possible obstructive sleep apnea with plan to schedule home sleep study and initiation of CPAP therapy if necessary. She is followed by [facility]. Most form complete to full code and full scope of treatment antibiotics, IV fluids and feeding tube if necessary.  I called Ms Shvartsman for scheduled initial palliative care telemedicine visit. Ms Fahmy and I talked about telemedicine video vs. Telephone. Ms Parchment an agreement to continue telephonically. We talked about purpose of palliative care. We talked about past medical history including rheumatoid arthritis, treatments, medications. We talked about congestive heart failure, pulmonary fibrosis, possible check with sleep apnea. We talked about her appointments with pulmonology for further evaluation of sleep study. We talked about her functional level. We talked about her residing with her children, life review completed. We talked about medical goals of care including aggressive versus conservative versus comfort care. We talked about currently she is a full code with aggressive interventions with most form completed. Ms Gabor endorses she has been thinking this over again and is considering changing her wishes. We talked about Hard Choice book. Ms Ezzell in agreement to have Hard Choice book in a blank most  form mailed for her to review for  further discussion of goals of care. We talked about role of palliative care and plan of care. Discuss follow-up palliative care appointment in 2 weeks to revisit most form or sooner should she want to discuss it sooner. Therapeutic listening and emotional support provided. Contact information provided. Questions answered to satisfaction .  Palliative Care was asked to help address goals of care.   CODE STATUS: full code  PPS: 50% HOSPICE ELIGIBILITY/DIAGNOSIS: TBD  PAST MEDICAL HISTORY:  Past Medical History:  Diagnosis Date  . Acute on chronic diastolic (congestive) heart failure ([place])   . Anemia    iron deficiency anemia - secondary to blood loss ( chronic)   . Arthritis    endstage changes bilateral knees/bilateral ankles.   . Asthma   . Carotid artery occlusion   . Chronic fatigue   . Chronic kidney disease   . Closed left hip fracture ([place])   . Clotting disorder ([place])    pt denies this  . Contusion of left knee    due to fall 1/14.  [name] COPD (chronic obstructive pulmonary disease) (HCC)    pulmonary fibrosis  . Depression, reactive   . Diabetes mellitus    type II   . Diastolic dysfunction   . Difficulty in walking   . Family history of heart disease   . Generalized muscle weakness   . Gout   . High cholesterol   . History of falling   . Hypertension   . Hypothyroidism   . Interstitial lung disease ([place])   . 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    . Morbid obesity ([place])   . Morbid obesity ([place])   . Neuromuscular disorder ([place])    diabetic neuropathy   . Normal coronary arteries    cardiac catheterization performed  10/31/14  . RA (rheumatoid arthritis) ([place])    has been off methotreaxte since 10/13.  [name] Rheumatoid arthritis ([place])   . Shortness of breath   . Spinal stenosis of lumbar region   . Thyroid disease   . Unspecified lack of  coordination   . URI (upper respiratory infection)     SOCIAL HX:  Social History   Tobacco Use  . Smoking status: Never Smoker  . Smokeless tobacco: Never Used  Substance Use Topics  . Alcohol use: No    ALLERGIES:  Allergies  Allergen Reactions  . Codeine Swelling and Other (See Comments)    Facial swelling Chest pain and swelling in legs  Facial swelling  . Infliximab Anaphylaxis    "sent me into shock" "sent me into shock"  . Lisinopril Swelling    Face and neck swelling Face and neck swelling     PERTINENT MEDICATIONS:  Outpatient Encounter Medications as of 03/29/2019  Medication Sig  . acetaminophen (TYLENOL) 500 MG tablet Take 1,000 mg by mouth every 6 (six) hours as needed for moderate pain.   [name] albuterol (PROVENTIL HFA;VENTOLIN HFA) 108 (90 BASE) MCG/ACT inhaler Inhale 2 puffs into the lungs every 6 (six) hours as needed for wheezing or shortness of breath.  . allopurinol (ZYLOPRIM) 300 MG tablet Take 300 mg by mouth daily.  [name] apixaban (ELIQUIS) 5 MG TABS tablet Take 1 tablet (5 mg total) by mouth 2 (two) times daily.  [name] atorvastatin (LIPITOR) 20 MG tablet Take 10 mg by mouth every evening.   [name] azithromycin (ZITHROMAX) 500 MG tablet Take 1 tablet by mouth daily. TAKE  ON MON, WED & FRI  . Calcium Carb-Cholecalciferol (CALCIUM + D3 PO) Take 2 tablets by mouth daily with lunch.   . cholecalciferol (VITAMIN D) 1000 UNITS tablet Take 2,000 Units by mouth daily with lunch.   . colchicine 0.6 MG tablet Take 0.6 mg by mouth daily.  . cyclobenzaprine (FLEXERIL) 5 MG tablet Take 1 tablet (5 mg total) by mouth at bedtime as needed for muscle spasms.  . diclofenac sodium (VOLTAREN) 1 % GEL Apply 2 g topically 3 (three) times daily. Both hands and knees  . docusate sodium (COLACE) 100 MG capsule Take 1 capsule (100 mg total) by mouth 2 (two) times daily.  . DULoxetine (CYMBALTA) 60 MG capsule Take 1 capsule (60 mg total) by mouth daily.  . fluticasone (FLONASE) 50 MCG/ACT  nasal spray Place 2 sprays into both nostrils daily.  . furosemide (LASIX) 20 MG tablet Take 3 tablets (60 mg total) by mouth daily.  [name] gabapentin (NEURONTIN) 600 MG tablet Take two in the morning , Two mid- afternoon and two at bedtime  . guaiFENesin (MUCINEX) 600 MG 12 hr tablet Take 1,200 mg by mouth 2 (two) times daily.  . hydroxychloroquine (PLAQUENIL) 200 MG tablet Take 400 mg by mouth daily.   [name] ipratropium-albuterol (DUONEB) 0.5-2.5 (3) MG/3ML SOLN Take 3 mLs by nebulization every 6 (six) hours as needed (cough/wheezing).   [name] leflunomide (ARAVA) 10 MG tablet Take 10 mg by mouth daily.  [name] levothyroxine (SYNTHROID) 50 MCG tablet Take 1 tablet (50 mcg total) by mouth daily.  [name] linaclotide (LINZESS) 72 MCG capsule Take 1 capsule (72 mcg total) by mouth daily before breakfast.  . metoprolol succinate (TOPROL XL) 100 MG 24 hr tablet Take 1/2 tablet by mouth daily with or immediately following a meal.  . morphine (MS CONTIN) 15 MG 12 hr tablet Take 1 tablet (15 mg total) by mouth every 12 (twelve) hours. Do Not Fill Before `03/24/2019  . morphine (MSIR) 15 MG tablet Take 1 tablet (15 mg total) by mouth 2 (two) times daily as needed for moderate pain (Breakthrough Pain).  . Multiple Vitamin (MULTIVITAMIN WITH MINERALS) TABS Take 1 tablet by mouth daily.   [name] olmesartan (BENICAR) 40 MG tablet Take 1 tablet (40 mg total) by mouth daily.  . OXYGEN-HELIUM IN Inhale 2 L into the lungs at bedtime. And on exertion  . pantoprazole (PROTONIX) 40 MG tablet take 1 tablet by mouth once daily MINUTES BEFORE 1ST MEAL OF THE DAY  . potassium chloride (K-DUR) 10 MEQ tablet Take 1 tablet (10 mEq total) by mouth daily.  . predniSONE (DELTASONE) 5 MG tablet Take 10 mg by mouth every morning.   . Semaglutide,0.25 or 0.5MG/DOS, (OZEMPIC, 0.25 OR 0.5 MG/DOSE,) 2 MG/1.5ML SOPN Inject 0.5 mg into the skin once a week.  . sodium chloride (OCEAN) 0.65 % SOLN nasal spray Place 1 spray into both nostrils as needed for  congestion.  [name] Spacer/Aero-Holding Chambers (AEROCHAMBER MV) inhaler Use as instructed   No facility-administered encounter medications on file as of 03/29/2019.     PHYSICAL EXAM:   Deferred  Seena Ritacco Z Nicle Connole, NP

## 2019-03-30 DIAGNOSIS — M069 Rheumatoid arthritis, unspecified: Secondary | ICD-10-CM | POA: Diagnosis not present

## 2019-03-30 DIAGNOSIS — N183 Chronic kidney disease, stage 3 unspecified: Secondary | ICD-10-CM | POA: Diagnosis not present

## 2019-03-30 DIAGNOSIS — E039 Hypothyroidism, unspecified: Secondary | ICD-10-CM | POA: Diagnosis not present

## 2019-03-30 DIAGNOSIS — N2581 Secondary hyperparathyroidism of renal origin: Secondary | ICD-10-CM | POA: Diagnosis not present

## 2019-03-30 DIAGNOSIS — N189 Chronic kidney disease, unspecified: Secondary | ICD-10-CM | POA: Diagnosis not present

## 2019-03-30 DIAGNOSIS — I129 Hypertensive chronic kidney disease with stage 1 through stage 4 chronic kidney disease, or unspecified chronic kidney disease: Secondary | ICD-10-CM | POA: Diagnosis not present

## 2019-03-30 DIAGNOSIS — E1122 Type 2 diabetes mellitus with diabetic chronic kidney disease: Secondary | ICD-10-CM | POA: Diagnosis not present

## 2019-03-30 DIAGNOSIS — D329 Benign neoplasm of meninges, unspecified: Secondary | ICD-10-CM | POA: Diagnosis not present

## 2019-03-30 DIAGNOSIS — I5189 Other ill-defined heart diseases: Secondary | ICD-10-CM | POA: Diagnosis not present

## 2019-03-30 DIAGNOSIS — E785 Hyperlipidemia, unspecified: Secondary | ICD-10-CM | POA: Diagnosis not present

## 2019-03-30 DIAGNOSIS — J849 Interstitial pulmonary disease, unspecified: Secondary | ICD-10-CM | POA: Diagnosis not present

## 2019-04-04 ENCOUNTER — Encounter: Payer: Medicare Other | Attending: Physical Medicine & Rehabilitation | Admitting: Registered Nurse

## 2019-04-04 ENCOUNTER — Other Ambulatory Visit: Payer: Self-pay

## 2019-04-04 ENCOUNTER — Encounter: Payer: Self-pay | Admitting: Registered Nurse

## 2019-04-04 VITALS — BP 136/90 | HR 94 | Temp 97.7°F | Ht 64.0 in | Wt 262.0 lb

## 2019-04-04 DIAGNOSIS — G894 Chronic pain syndrome: Secondary | ICD-10-CM | POA: Insufficient documentation

## 2019-04-04 DIAGNOSIS — M7062 Trochanteric bursitis, left hip: Secondary | ICD-10-CM | POA: Diagnosis not present

## 2019-04-04 DIAGNOSIS — M48061 Spinal stenosis, lumbar region without neurogenic claudication: Secondary | ICD-10-CM | POA: Diagnosis not present

## 2019-04-04 DIAGNOSIS — Z79891 Long term (current) use of opiate analgesic: Secondary | ICD-10-CM | POA: Diagnosis not present

## 2019-04-04 DIAGNOSIS — M069 Rheumatoid arthritis, unspecified: Secondary | ICD-10-CM | POA: Diagnosis not present

## 2019-04-04 DIAGNOSIS — M1711 Unilateral primary osteoarthritis, right knee: Secondary | ICD-10-CM | POA: Diagnosis not present

## 2019-04-04 DIAGNOSIS — M1712 Unilateral primary osteoarthritis, left knee: Secondary | ICD-10-CM | POA: Diagnosis not present

## 2019-04-04 DIAGNOSIS — M48062 Spinal stenosis, lumbar region with neurogenic claudication: Secondary | ICD-10-CM | POA: Insufficient documentation

## 2019-04-04 DIAGNOSIS — F329 Major depressive disorder, single episode, unspecified: Secondary | ICD-10-CM | POA: Insufficient documentation

## 2019-04-04 DIAGNOSIS — E1122 Type 2 diabetes mellitus with diabetic chronic kidney disease: Secondary | ICD-10-CM | POA: Insufficient documentation

## 2019-04-04 DIAGNOSIS — M7061 Trochanteric bursitis, right hip: Secondary | ICD-10-CM | POA: Diagnosis not present

## 2019-04-04 DIAGNOSIS — Z90710 Acquired absence of both cervix and uterus: Secondary | ICD-10-CM | POA: Diagnosis not present

## 2019-04-04 DIAGNOSIS — Z5181 Encounter for therapeutic drug level monitoring: Secondary | ICD-10-CM | POA: Diagnosis not present

## 2019-04-04 DIAGNOSIS — M546 Pain in thoracic spine: Secondary | ICD-10-CM

## 2019-04-04 DIAGNOSIS — G8929 Other chronic pain: Secondary | ICD-10-CM

## 2019-04-04 DIAGNOSIS — E1142 Type 2 diabetes mellitus with diabetic polyneuropathy: Secondary | ICD-10-CM | POA: Insufficient documentation

## 2019-04-04 DIAGNOSIS — M47816 Spondylosis without myelopathy or radiculopathy, lumbar region: Secondary | ICD-10-CM | POA: Insufficient documentation

## 2019-04-04 DIAGNOSIS — Z9889 Other specified postprocedural states: Secondary | ICD-10-CM | POA: Diagnosis not present

## 2019-04-04 DIAGNOSIS — M5416 Radiculopathy, lumbar region: Secondary | ICD-10-CM | POA: Diagnosis not present

## 2019-04-04 MED ORDER — MORPHINE SULFATE ER 15 MG PO TBCR: 15.0000 mg | EXTENDED_RELEASE_TABLET | Freq: Two times a day (BID) | ORAL | 0 refills | Status: DC

## 2019-04-04 MED ORDER — MORPHINE SULFATE 15 MG PO TABS: 15.0000 mg | ORAL_TABLET | Freq: Two times a day (BID) | ORAL | 0 refills | Status: DC | PRN

## 2019-04-04 NOTE — Progress Notes (Signed)
Subjective:    Patient ID: [name], female    DOB: [birth year], 78 y.o.   MRN: [id number]  HPI: [name] is a 78 y.o. female who returns for follow up appointment for chronic pain and medication refill. She states her pain is located in her bilateral shoulders, mid- lower back pain radiating into her bilateral lower extremities. Also reports bilateral hip pain and right knee pain. She rates her pain 5. Her current exercise regime is walking in her home with walker. She arrived today in wheelchair.   [name] Morphine equivalent is 60.00 MME.  UDS ordered today.   Pain Inventory Average Pain 5 Pain Right Now 5 My pain is stabbing  In the last 24 hours, has pain interfered with the following? General activity 7 Relation with others 7 Enjoyment of life 7 What TIME of day is your pain at its worst? morning Sleep (in general) Fair  Pain is worse with: walking, bending and standing Pain improves with: rest, heat/ice and medication Relief from Meds: 5  Mobility use a walker ability to climb steps?  no do you drive?  no use a wheelchair  Function retired I need assistance with the following:  meal prep, household duties and shopping  Neuro/Psych bladder control problems numbness spasms  Prior Studies Any changes since last visit?  no  Physicians involved in your care Any changes since last visit?  no   Family History  Problem Relation Age of Onset  . Diabetes Mother   . Heart attack Mother   . Hypertension Father   . Lung cancer Father   . Diabetes Sister   . Diabetes Brother   . Hypertension Brother   . Heart disease Brother   . Heart attack Brother   . Kidney cancer Brother   . Uterine cancer Daughter   . Breast cancer Sister   . Rheum arthritis Maternal Uncle   . Gout Brother   . Kidney failure Brother        x 5   Social History   Socioeconomic History  . Marital status: Married    Spouse name: Not on file  . Number of children:  6  . Years of education: college  . Highest education level: Not on file  Occupational History  . Occupation: retired Ship broker  . Financial resource strain: Not on file  . Food insecurity    Worry: Not on file    Inability: Not on file  . Transportation needs    Medical: Not on file    Non-medical: Not on file  Tobacco Use  . Smoking status: Never Smoker  . Smokeless tobacco: Never Used  Substance and Sexual Activity  . Alcohol use: No  . Drug use: No  . Sexual activity: Not Currently  Lifestyle  . Physical activity    Days per week: Not on file    Minutes per session: Not on file  . Stress: Not on file  Relationships  . Social Herbalist on phone: Not on file    Gets together: Not on file    Attends religious service: Not on file    Active member of club or organization: Not on file    Attends meetings of clubs or organizations: Not on file    Relationship status: Not on file  Other Topics Concern  . Not on file  Social History Narrative   Patient consumes 2-3 cups coffee per day.   Past  Surgical History:  Procedure Laterality Date  . ABDOMINAL HYSTERECTOMY    . BRAIN SURGERY     Gamma knife 10/13. Needs repeat spring  '14  . CARDIAC CATHETERIZATION N/A 10/31/2014   Procedure: Right/Left Heart Cath and Coronary Angiography;  Surgeon: [name], MD;  Location: [facility];  Service: Cardiovascular;  Laterality: N/A;  . ESOPHAGOGASTRODUODENOSCOPY (EGD) WITH PROPOFOL N/A 09/14/2014   Procedure: ESOPHAGOGASTRODUODENOSCOPY (EGD) WITH PROPOFOL;  Surgeon: [name], MD;  Location: [facility];  Service: Endoscopy;  Laterality: N/A;  . INCISION AND DRAINAGE HIP Left 01/16/2017   Procedure: IRRIGATION AND DEBRIDEMENT LEFT HIP;  Surgeon: [name], MD;  Location: [facility];  Service: Orthopedics;  Laterality: Left;  . INTRAMEDULLARY (IM) NAIL INTERTROCHANTERIC Left 11/29/2016   Procedure: INTRAMEDULLARY (IM) NAIL  INTERTROCHANTRIC;  Surgeon: [name], MD;  Location: [facility];  Service: Orthopedics;  Laterality: Left;  . OVARY SURGERY    . SHOULDER SURGERY Left   . TONSILLECTOMY  age 27  . VIDEO BRONCHOSCOPY Bilateral 05/31/2013   Procedure: VIDEO BRONCHOSCOPY WITHOUT FLUORO;  Surgeon: Brand Males, MD;  Location: [facility];  Service: Cardiopulmonary;  Laterality: Bilateral;  . video bronscoscopy  2000   lung   Past Medical History:  Diagnosis Date  . Acute on chronic diastolic (congestive) heart failure ([place])   . Anemia    iron deficiency anemia - secondary to blood loss ( chronic)   . Arthritis    endstage changes bilateral knees/bilateral ankles.   . Asthma   . Carotid artery occlusion   . Chronic fatigue   . Chronic kidney disease   . Closed left hip fracture ([place])   . Clotting disorder ([place])    pt denies this  . Contusion of left knee    due to fall 1/14.  [name] COPD (chronic obstructive pulmonary disease) (HCC)    pulmonary fibrosis  . Depression, reactive   . Diabetes mellitus    type II   . Diastolic dysfunction   . Difficulty in walking   . Family history of heart disease   . Generalized muscle weakness   . Gout   . High cholesterol   . History of falling   . Hypertension   . Hypothyroidism   . Interstitial lung disease ([place])   . 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    . Morbid obesity ([place])   . Morbid obesity ([place])   . Neuromuscular disorder ([place])    diabetic neuropathy   . Normal coronary arteries    cardiac catheterization performed  10/31/14  . RA (rheumatoid arthritis) ([place])    has been off methotreaxte since 10/13.  [name] Rheumatoid arthritis ([place])   . Shortness of breath   . Spinal stenosis of lumbar region   . Thyroid disease   . Unspecified lack of coordination   . URI (upper respiratory infection)    BP 136/90   Pulse 94   Temp 97.7 F (36.5 C)    Ht _0  (1.626 m)   Wt 262 lb (118.8 kg)   SpO2 92%   BMI 44.97 kg/m   Opioid Risk Score:   Fall Risk Score:  `1  Depression screen PHQ 2/9  Depression screen [facility] 2/9 06/11/2018 05/26/2018 12/09/2017 10/22/2017 09/22/2017 07/28/2017 06/30/2017  Decreased Interest _1 0 0 0 0  Down, Depressed, Hopeless _2 0 0 0 0  PHQ -  2 Score _0 0 0 0 0  Altered sleeping 2 2 - - - - -  Tired, decreased energy 2 3 - - - - -  Change in appetite 2 1 - - - - -  Feeling bad or failure about yourself  2 2 - - - - -  Trouble concentrating 2 0 - - - - -  Moving slowly or fidgety/restless 0 2 - - - - -  Suicidal thoughts 0 0 - - - - -  PHQ-9 Score 12 13 - - - - -  Difficult doing work/chores - Somewhat difficult - - - - -  Some recent data might be hidden    Review of Systems  Respiratory: Positive for shortness of breath.   Musculoskeletal:       Spasms  Neurological: Positive for numbness.  All other systems reviewed and are negative.      Objective:   Physical Exam Vitals signs and nursing note reviewed.  Constitutional:      Appearance: Normal appearance. She is obese.  Neck:     Musculoskeletal: Normal range of motion and neck supple.  Cardiovascular:     Rate and Rhythm: Normal rate and regular rhythm.     Pulses: Normal pulses.     Heart sounds: Normal heart sounds.  Pulmonary:     Effort: Pulmonary effort is normal.     Breath sounds: Normal breath sounds.  Musculoskeletal:     Comments: Normal Muscle Bulk and Muscle Testing Reveals:  Upper Extremities: Full ROM and Muscle Strength 5/5 Bilateral AC Joint Tenderness  Thoracic Paraspinal Tenderness: T-7-T-9 Lumbar Paraspinal Tenderness: L-3-L-5 Lower Extremities: Full ROM and Muscle Strength 5/5  Arrived in wheelchair  Skin:    General: Skin is warm and dry.  Neurological:     Mental Status: She is alert and oriented to person, place, and time.  Psychiatric:        Mood and Affect: Mood normal.        Behavior: Behavior  normal.           Assessment & Plan:  1. Lumbar spinal stenosis with neurogenic claudication. Associated facet arthropathy/ Lumbar Radiculitis: Continuecurrent medication regime withGabapentinand HEP as tolerated.04/04/2019 Refilled:MS Contin 15 mg one tablet every 12 hours #60 and MSIR 15 one tablet twice a dayas neededFor break through pain( 6 am and 1:00 pm)#60.Marland KitchenSecond script given for the following month. We will continue the opioid monitoring program, this consists of regular clinic visits, examinations, urine drug screen, pill counts as well as use of [place] Controlled Substance reporting System. 2. Depression: Continuecurrent medication Regime:Cymbalta.04/04/2019 3. Diabetes mellitus type 2 with polyneuropathy: Continuecurrent medication regime:Gabapentin.04/04/2019 4. Rheumatoid arthritis and osteoarthritis. Continuecurrent medication Regime.Voltaren Gel. Rheumatology Following.04/04/2019 5. Interstitial lung disease: Pulmonology Following.04/04/2019. 6. Bilateral Osteoarthritis Knee's: Continuecurrent medication regimen.Voltaren Gel.04/04/2019. 7. Muscle Spasm: Continuecurrent medication regime: Flexeril.04/04/2019 8.BilateralGreater Trochanteric Tenderness:R>L.Continuecurrent treatmentwith Ice/Heat Therapy.04/04/2019. 9. Polyarthralgia: Rheumatology Following: Continue to monitor.04/04/2019 10. Morbid Obesitity: Continue Healthy Diet Regime and HEP. Continue to monitor.04/04/2019  20 minutes of face to face patient care time was spent during this visit. All questions were encouraged and answered.   F/U in 2 months

## 2019-04-07 ENCOUNTER — Other Ambulatory Visit: Payer: Self-pay

## 2019-04-07 ENCOUNTER — Ambulatory Visit: Payer: Medicare Other

## 2019-04-07 DIAGNOSIS — G4733 Obstructive sleep apnea (adult) (pediatric): Secondary | ICD-10-CM

## 2019-04-07 LAB — TOXASSURE SELECT,+ANTIDEPR,UR

## 2019-04-08 ENCOUNTER — Telehealth: Payer: Self-pay | Admitting: *Deleted

## 2019-04-08 NOTE — Telephone Encounter (Signed)
Urine drug screen for this encounter is consistent for prescribed medication 

## 2019-04-11 ENCOUNTER — Other Ambulatory Visit: Payer: Medicare Other | Admitting: Nurse Practitioner

## 2019-04-11 ENCOUNTER — Other Ambulatory Visit: Payer: Self-pay

## 2019-04-13 ENCOUNTER — Telehealth: Payer: Self-pay

## 2019-04-13 DIAGNOSIS — G4733 Obstructive sleep apnea (adult) (pediatric): Secondary | ICD-10-CM

## 2019-04-13 NOTE — Telephone Encounter (Signed)
LMTCB   HST results:  Negative for OSA however oxygen desaturated. Will need ONO. Will order once patient has been notified.  Will route message to triage as AO nurse is out and messages will need to be addressed in triage.

## 2019-04-15 NOTE — Telephone Encounter (Signed)
Call made to patient, confirmed DOB, made aware of results. Okay with ONO order to be placed.   AO please advise, pt states she was on 2L during the HST. ONO with 2L? Thanks.

## 2019-04-21 ENCOUNTER — Encounter (HOSPITAL_COMMUNITY): Payer: Self-pay

## 2019-04-21 ENCOUNTER — Inpatient Hospital Stay (HOSPITAL_COMMUNITY)
Admission: EM | Admit: 2019-04-21 | Discharge: 2019-04-28 | DRG: 872 | Disposition: A | Discharge status: Home Health | Payer: Medicare Other | Attending: Internal Medicine | Admitting: Internal Medicine

## 2019-04-21 ENCOUNTER — Telehealth: Payer: Self-pay | Admitting: Internal Medicine

## 2019-04-21 ENCOUNTER — Emergency Department (HOSPITAL_COMMUNITY): Payer: Medicare Other

## 2019-04-21 ENCOUNTER — Other Ambulatory Visit: Payer: Self-pay

## 2019-04-21 ENCOUNTER — Ambulatory Visit (INDEPENDENT_AMBULATORY_CARE_PROVIDER_SITE_OTHER)
Admission: EM | Admit: 2019-04-21 | Discharge: 2019-04-21 | Disposition: A | Discharge status: Home / Self Care | Payer: Medicare Other | Source: Home / Self Care | Attending: Family Medicine | Admitting: Family Medicine

## 2019-04-21 DIAGNOSIS — E11628 Type 2 diabetes mellitus with other skin complications: Secondary | ICD-10-CM

## 2019-04-21 DIAGNOSIS — E119 Type 2 diabetes mellitus without complications: Secondary | ICD-10-CM

## 2019-04-21 DIAGNOSIS — R079 Chest pain, unspecified: Secondary | ICD-10-CM | POA: Diagnosis not present

## 2019-04-21 DIAGNOSIS — G8929 Other chronic pain: Secondary | ICD-10-CM

## 2019-04-21 DIAGNOSIS — E039 Hypothyroidism, unspecified: Secondary | ICD-10-CM | POA: Diagnosis present

## 2019-04-21 DIAGNOSIS — M25551 Pain in right hip: Secondary | ICD-10-CM | POA: Diagnosis present

## 2019-04-21 DIAGNOSIS — L03115 Cellulitis of right lower limb: Secondary | ICD-10-CM | POA: Diagnosis not present

## 2019-04-21 DIAGNOSIS — I959 Hypotension, unspecified: Secondary | ICD-10-CM | POA: Diagnosis present

## 2019-04-21 DIAGNOSIS — Z6841 Body Mass Index (BMI) 40.0 and over, adult: Secondary | ICD-10-CM

## 2019-04-21 DIAGNOSIS — Z885 Allergy status to narcotic agent status: Secondary | ICD-10-CM

## 2019-04-21 DIAGNOSIS — J9611 Chronic respiratory failure with hypoxia: Secondary | ICD-10-CM | POA: Diagnosis present

## 2019-04-21 DIAGNOSIS — N1831 Chronic kidney disease, stage 3a: Secondary | ICD-10-CM | POA: Diagnosis present

## 2019-04-21 DIAGNOSIS — N179 Acute kidney failure, unspecified: Secondary | ICD-10-CM | POA: Diagnosis present

## 2019-04-21 DIAGNOSIS — Z79890 Hormone replacement therapy (postmenopausal): Secondary | ICD-10-CM

## 2019-04-21 DIAGNOSIS — A419 Sepsis, unspecified organism: Secondary | ICD-10-CM | POA: Diagnosis not present

## 2019-04-21 DIAGNOSIS — N189 Chronic kidney disease, unspecified: Secondary | ICD-10-CM | POA: Diagnosis present

## 2019-04-21 DIAGNOSIS — D689 Coagulation defect, unspecified: Secondary | ICD-10-CM | POA: Diagnosis present

## 2019-04-21 DIAGNOSIS — R0603 Acute respiratory distress: Secondary | ICD-10-CM | POA: Diagnosis not present

## 2019-04-21 DIAGNOSIS — M069 Rheumatoid arthritis, unspecified: Secondary | ICD-10-CM | POA: Diagnosis present

## 2019-04-21 DIAGNOSIS — J849 Interstitial pulmonary disease, unspecified: Secondary | ICD-10-CM

## 2019-04-21 DIAGNOSIS — M1990 Unspecified osteoarthritis, unspecified site: Secondary | ICD-10-CM | POA: Diagnosis present

## 2019-04-21 DIAGNOSIS — L03119 Cellulitis of unspecified part of limb: Secondary | ICD-10-CM

## 2019-04-21 DIAGNOSIS — M109 Gout, unspecified: Secondary | ICD-10-CM | POA: Diagnosis present

## 2019-04-21 DIAGNOSIS — R197 Diarrhea, unspecified: Secondary | ICD-10-CM | POA: Diagnosis present

## 2019-04-21 DIAGNOSIS — L03116 Cellulitis of left lower limb: Secondary | ICD-10-CM | POA: Diagnosis not present

## 2019-04-21 DIAGNOSIS — Z841 Family history of disorders of kidney and ureter: Secondary | ICD-10-CM

## 2019-04-21 DIAGNOSIS — R000 Tachycardia, unspecified: Secondary | ICD-10-CM | POA: Diagnosis present

## 2019-04-21 DIAGNOSIS — E876 Hypokalemia: Secondary | ICD-10-CM

## 2019-04-21 DIAGNOSIS — R0602 Shortness of breath: Secondary | ICD-10-CM | POA: Diagnosis not present

## 2019-04-21 DIAGNOSIS — F329 Major depressive disorder, single episode, unspecified: Secondary | ICD-10-CM | POA: Diagnosis present

## 2019-04-21 DIAGNOSIS — Z801 Family history of malignant neoplasm of trachea, bronchus and lung: Secondary | ICD-10-CM

## 2019-04-21 DIAGNOSIS — N390 Urinary tract infection, site not specified: Secondary | ICD-10-CM | POA: Diagnosis present

## 2019-04-21 DIAGNOSIS — Z9981 Dependence on supplemental oxygen: Secondary | ICD-10-CM

## 2019-04-21 DIAGNOSIS — Z8249 Family history of ischemic heart disease and other diseases of the circulatory system: Secondary | ICD-10-CM

## 2019-04-21 DIAGNOSIS — R072 Precordial pain: Secondary | ICD-10-CM | POA: Diagnosis present

## 2019-04-21 DIAGNOSIS — E1140 Type 2 diabetes mellitus with diabetic neuropathy, unspecified: Secondary | ICD-10-CM | POA: Diagnosis present

## 2019-04-21 DIAGNOSIS — R531 Weakness: Secondary | ICD-10-CM

## 2019-04-21 DIAGNOSIS — E079 Disorder of thyroid, unspecified: Secondary | ICD-10-CM | POA: Diagnosis present

## 2019-04-21 DIAGNOSIS — Z7982 Long term (current) use of aspirin: Secondary | ICD-10-CM

## 2019-04-21 DIAGNOSIS — R0789 Other chest pain: Secondary | ICD-10-CM | POA: Diagnosis not present

## 2019-04-21 DIAGNOSIS — M48061 Spinal stenosis, lumbar region without neurogenic claudication: Secondary | ICD-10-CM | POA: Diagnosis present

## 2019-04-21 DIAGNOSIS — Z888 Allergy status to other drugs, medicaments and biological substances status: Secondary | ICD-10-CM

## 2019-04-21 DIAGNOSIS — Z8049 Family history of malignant neoplasm of other genital organs: Secondary | ICD-10-CM

## 2019-04-21 DIAGNOSIS — Z7952 Long term (current) use of systemic steroids: Secondary | ICD-10-CM

## 2019-04-21 DIAGNOSIS — I5032 Chronic diastolic (congestive) heart failure: Secondary | ICD-10-CM | POA: Diagnosis present

## 2019-04-21 DIAGNOSIS — Z79899 Other long term (current) drug therapy: Secondary | ICD-10-CM

## 2019-04-21 DIAGNOSIS — Z803 Family history of malignant neoplasm of breast: Secondary | ICD-10-CM

## 2019-04-21 DIAGNOSIS — G4733 Obstructive sleep apnea (adult) (pediatric): Secondary | ICD-10-CM | POA: Diagnosis present

## 2019-04-21 DIAGNOSIS — E1142 Type 2 diabetes mellitus with diabetic polyneuropathy: Secondary | ICD-10-CM | POA: Diagnosis present

## 2019-04-21 DIAGNOSIS — E785 Hyperlipidemia, unspecified: Secondary | ICD-10-CM | POA: Diagnosis present

## 2019-04-21 DIAGNOSIS — D500 Iron deficiency anemia secondary to blood loss (chronic): Secondary | ICD-10-CM | POA: Diagnosis present

## 2019-04-21 DIAGNOSIS — Z7901 Long term (current) use of anticoagulants: Secondary | ICD-10-CM

## 2019-04-21 DIAGNOSIS — J449 Chronic obstructive pulmonary disease, unspecified: Secondary | ICD-10-CM | POA: Diagnosis present

## 2019-04-21 DIAGNOSIS — J8410 Pulmonary fibrosis, unspecified: Secondary | ICD-10-CM | POA: Diagnosis present

## 2019-04-21 DIAGNOSIS — Z20828 Contact with and (suspected) exposure to other viral communicable diseases: Secondary | ICD-10-CM | POA: Diagnosis present

## 2019-04-21 DIAGNOSIS — Z8051 Family history of malignant neoplasm of kidney: Secondary | ICD-10-CM

## 2019-04-21 DIAGNOSIS — M549 Dorsalgia, unspecified: Secondary | ICD-10-CM | POA: Diagnosis present

## 2019-04-21 DIAGNOSIS — R0600 Dyspnea, unspecified: Secondary | ICD-10-CM

## 2019-04-21 DIAGNOSIS — E1122 Type 2 diabetes mellitus with diabetic chronic kidney disease: Secondary | ICD-10-CM | POA: Diagnosis present

## 2019-04-21 DIAGNOSIS — Z833 Family history of diabetes mellitus: Secondary | ICD-10-CM

## 2019-04-21 DIAGNOSIS — I130 Hypertensive heart and chronic kidney disease with heart failure and stage 1 through stage 4 chronic kidney disease, or unspecified chronic kidney disease: Secondary | ICD-10-CM | POA: Diagnosis present

## 2019-04-21 DIAGNOSIS — K219 Gastro-esophageal reflux disease without esophagitis: Secondary | ICD-10-CM | POA: Diagnosis present

## 2019-04-21 DIAGNOSIS — Z86718 Personal history of other venous thrombosis and embolism: Secondary | ICD-10-CM

## 2019-04-21 LAB — BASIC METABOLIC PANEL
Anion gap: 13 (ref 5–15)
BUN: 10 mg/dL (ref 8–23)
CO2: 25 mmol/L (ref 22–32)
Calcium: 8.4 mg/dL — ABNORMAL LOW (ref 8.9–10.3)
Chloride: 102 mmol/L (ref 98–111)
Creatinine, Ser: 2.18 mg/dL — ABNORMAL HIGH (ref 0.44–1.00)
GFR calc Af Amer: 25 mL/min — ABNORMAL LOW (ref >60)
GFR calc non Af Amer: 21 mL/min — ABNORMAL LOW (ref >60)
Glucose, Bld: 231 mg/dL — ABNORMAL HIGH (ref 70–99)
Potassium: 3 mmol/L — ABNORMAL LOW (ref 3.5–5.1)
Sodium: 140 mmol/L (ref 135–145)

## 2019-04-21 LAB — TROPONIN I (HIGH SENSITIVITY)
Troponin I (High Sensitivity): 11 ng/L (ref <18)
Troponin I (High Sensitivity): 9 ng/L (ref <18)

## 2019-04-21 LAB — CBC
HCT: 43.5 % (ref 36.0–46.0)
Hemoglobin: 12.8 g/dL (ref 12.0–15.0)
MCH: 29.2 pg (ref 26.0–34.0)
MCHC: 29.4 g/dL — ABNORMAL LOW (ref 30.0–36.0)
MCV: 99.3 fL (ref 80.0–100.0)
Platelets: 154 10*3/uL (ref 150–400)
RBC: 4.38 MIL/uL (ref 3.87–5.11)
RDW: 15.5 % (ref 11.5–15.5)
WBC: 6.4 10*3/uL (ref 4.0–10.5)
nRBC: 0 % (ref 0.0–0.2)

## 2019-04-21 LAB — BRAIN NATRIURETIC PEPTIDE: B Natriuretic Peptide: 10.8 pg/mL (ref 0.0–100.0)

## 2019-04-21 LAB — HEPATIC FUNCTION PANEL
ALT: 36 U/L (ref 0–44)
AST: 33 U/L (ref 15–41)
Albumin: 3 g/dL — ABNORMAL LOW (ref 3.5–5.0)
Alkaline Phosphatase: 94 U/L (ref 38–126)
Bilirubin, Direct: 0.1 mg/dL (ref 0.0–0.2)
Indirect Bilirubin: 0.5 mg/dL (ref 0.3–0.9)
Total Bilirubin: 0.6 mg/dL (ref 0.3–1.2)
Total Protein: 5.5 g/dL — ABNORMAL LOW (ref 6.5–8.1)

## 2019-04-21 LAB — CBG MONITORING, ED: Glucose-Capillary: 153 mg/dL — ABNORMAL HIGH (ref 70–99)

## 2019-04-21 LAB — POC SARS CORONAVIRUS 2 AG -  ED: SARS Coronavirus 2 Ag: NEGATIVE

## 2019-04-21 MED ORDER — SALINE SPRAY 0.65 % NA SOLN
1.0000 | NASAL | Status: DC | PRN
  Filled 2019-04-21: qty 44

## 2019-04-21 MED ORDER — GUAIFENESIN ER 600 MG PO TB12
1200.0000 mg | ORAL_TABLET | Freq: Two times a day (BID) | ORAL | Status: DC
  Administered 2019-04-22 – 2019-04-24 (×6): 1200 mg via ORAL
  Filled 2019-04-21 (×6): qty 2

## 2019-04-21 MED ORDER — PANTOPRAZOLE SODIUM 40 MG PO TBEC
40.0000 mg | DELAYED_RELEASE_TABLET | Freq: Every day | ORAL | Status: DC
  Administered 2019-04-22 – 2019-04-28 (×8): 40 mg via ORAL
  Filled 2019-04-21 (×8): qty 1

## 2019-04-21 MED ORDER — DULOXETINE HCL 60 MG PO CPEP
60.0000 mg | ORAL_CAPSULE | Freq: Every day | ORAL | Status: DC
  Administered 2019-04-22 – 2019-04-28 (×7): 60 mg via ORAL
  Filled 2019-04-21 (×8): qty 1

## 2019-04-21 MED ORDER — POTASSIUM CHLORIDE CRYS ER 20 MEQ PO TBCR
40.0000 meq | EXTENDED_RELEASE_TABLET | Freq: Once | ORAL | Status: AC
  Administered 2019-04-22: 40 meq via ORAL
  Filled 2019-04-21: qty 2

## 2019-04-21 MED ORDER — LEVOTHYROXINE SODIUM 50 MCG PO TABS
50.0000 ug | ORAL_TABLET | Freq: Every day | ORAL | Status: DC
  Administered 2019-04-22 – 2019-04-28 (×7): 50 ug via ORAL
  Filled 2019-04-21 (×7): qty 1

## 2019-04-21 MED ORDER — SODIUM CHLORIDE 0.9 % IV BOLUS
500.0000 mL | Freq: Once | INTRAVENOUS | Status: AC
  Administered 2019-04-21: 500 mL via INTRAVENOUS

## 2019-04-21 MED ORDER — HEPARIN (PORCINE) 25000 UT/250ML-% IV SOLN
1700.0000 [IU]/h | INTRAVENOUS | Status: DC
  Administered 2019-04-21: 1000 [IU]/h via INTRAVENOUS
  Administered 2019-04-22: 1300 [IU]/h via INTRAVENOUS
  Administered 2019-04-23: 1700 [IU]/h via INTRAVENOUS
  Filled 2019-04-21 (×2): qty 250

## 2019-04-21 MED ORDER — INSULIN ASPART 100 UNIT/ML ~~LOC~~ SOLN
0.0000 [IU] | Freq: Three times a day (TID) | SUBCUTANEOUS | Status: DC
  Administered 2019-04-22 – 2019-04-25 (×8): 2 [IU] via SUBCUTANEOUS
  Administered 2019-04-25 – 2019-04-26 (×3): 1 [IU] via SUBCUTANEOUS
  Administered 2019-04-26: 2 [IU] via SUBCUTANEOUS
  Administered 2019-04-26 – 2019-04-27 (×2): 1 [IU] via SUBCUTANEOUS
  Administered 2019-04-27: 2 [IU] via SUBCUTANEOUS
  Administered 2019-04-27: 1 [IU] via SUBCUTANEOUS

## 2019-04-21 MED ORDER — LEFLUNOMIDE 20 MG PO TABS
10.0000 mg | ORAL_TABLET | Freq: Every day | ORAL | Status: DC
  Administered 2019-04-22 – 2019-04-28 (×7): 10 mg via ORAL
  Filled 2019-04-21 (×7): qty 0.5

## 2019-04-21 MED ORDER — SODIUM CHLORIDE 0.9 % IV SOLN: INTRAVENOUS | Status: DC

## 2019-04-21 MED ORDER — IPRATROPIUM-ALBUTEROL 0.5-2.5 (3) MG/3ML IN SOLN: 3.0000 mL | Freq: Four times a day (QID) | RESPIRATORY_TRACT | Status: DC | PRN

## 2019-04-21 MED ORDER — INSULIN ASPART 100 UNIT/ML ~~LOC~~ SOLN
0.0000 [IU] | Freq: Every day | SUBCUTANEOUS | Status: DC
  Administered 2019-04-22: 1 [IU] via SUBCUTANEOUS

## 2019-04-21 MED ORDER — VITAMIN D 25 MCG (1000 UNIT) PO TABS
2000.0000 [IU] | ORAL_TABLET | Freq: Every day | ORAL | Status: DC
  Administered 2019-04-22 – 2019-04-27 (×6): 2000 [IU] via ORAL
  Filled 2019-04-21 (×7): qty 2

## 2019-04-21 MED ORDER — IPRATROPIUM-ALBUTEROL 20-100 MCG/ACT IN AERS: 1.0000 | INHALATION_SPRAY | Freq: Four times a day (QID) | RESPIRATORY_TRACT | Status: DC | PRN

## 2019-04-21 MED ORDER — ALBUTEROL SULFATE HFA 108 (90 BASE) MCG/ACT IN AERS: 2.0000 | INHALATION_SPRAY | Freq: Four times a day (QID) | RESPIRATORY_TRACT | Status: DC | PRN

## 2019-04-21 MED ORDER — ADULT MULTIVITAMIN W/MINERALS CH
1.0000 | ORAL_TABLET | Freq: Every day | ORAL | Status: DC
  Administered 2019-04-22 – 2019-04-28 (×7): 1 via ORAL
  Filled 2019-04-21 (×7): qty 1

## 2019-04-21 MED ORDER — HEPARIN BOLUS VIA INFUSION
5000.0000 [IU] | Freq: Once | INTRAVENOUS | Status: AC
  Administered 2019-04-21: 5000 [IU] via INTRAVENOUS
  Filled 2019-04-21: qty 5000

## 2019-04-21 MED ORDER — ALLOPURINOL 300 MG PO TABS
300.0000 mg | ORAL_TABLET | Freq: Every day | ORAL | Status: DC
  Administered 2019-04-22 – 2019-04-28 (×7): 300 mg via ORAL
  Filled 2019-04-21 (×3): qty 1
  Filled 2019-04-21: qty 3
  Filled 2019-04-21 (×5): qty 1

## 2019-04-21 MED ORDER — ACETAMINOPHEN 500 MG PO TABS
1000.0000 mg | ORAL_TABLET | Freq: Four times a day (QID) | ORAL | Status: DC | PRN
  Administered 2019-04-23 – 2019-04-27 (×7): 1000 mg via ORAL
  Filled 2019-04-21 (×7): qty 2

## 2019-04-21 MED ORDER — GABAPENTIN 300 MG PO CAPS
1200.0000 mg | ORAL_CAPSULE | Freq: Three times a day (TID) | ORAL | Status: DC
  Administered 2019-04-22 – 2019-04-28 (×20): 1200 mg via ORAL
  Filled 2019-04-21 (×3): qty 4
  Filled 2019-04-21: qty 12
  Filled 2019-04-21 (×3): qty 4
  Filled 2019-04-21: qty 12
  Filled 2019-04-21: qty 4
  Filled 2019-04-21: qty 12
  Filled 2019-04-21 (×11): qty 4

## 2019-04-21 MED ORDER — HYDROXYCHLOROQUINE SULFATE 200 MG PO TABS
400.0000 mg | ORAL_TABLET | Freq: Every day | ORAL | Status: DC
  Administered 2019-04-22 – 2019-04-28 (×7): 400 mg via ORAL
  Filled 2019-04-21 (×7): qty 2

## 2019-04-21 MED ORDER — FLUTICASONE PROPIONATE 50 MCG/ACT NA SUSP
2.0000 | Freq: Every day | NASAL | Status: DC
  Administered 2019-04-22 – 2019-04-28 (×7): 2 via NASAL
  Filled 2019-04-21: qty 16

## 2019-04-21 MED ORDER — ATORVASTATIN CALCIUM 10 MG PO TABS
10.0000 mg | ORAL_TABLET | Freq: Every evening | ORAL | Status: DC
  Administered 2019-04-22 – 2019-04-27 (×7): 10 mg via ORAL
  Filled 2019-04-21 (×8): qty 1

## 2019-04-21 NOTE — ED Triage Notes (Signed)
Pt arrives POV for eval of persistent chest pain radiating through her back onset last evening w/ associated worsening SOB. Pt wears 2LPM [place] at night, but reports that she has been requiring more throughout the day. Pt is lethargic in triage, tachy to 120s-130s. Denies known sick contacts.

## 2019-04-21 NOTE — ED Notes (Signed)
Patient transported to CT 

## 2019-04-21 NOTE — Telephone Encounter (Signed)
Primary Pulmonologist: MR and AO Last office visit and with whom: 10/28 by AO What do we see them for (pulmonary problems): ILD and OSA Last OV assessment/plan:  Instructions from OV with MR 9/24  ILD (interstitial lung disease) ([place]) Bronchiectasis without complication ([place])  - stable and per Dr B [facility]  History of snoring  - refer Dr Gala Murdoch for sleep apnea eval  History of DVT of lower extremity - Jan 2020   - check d-dimer 02/10/2019 and discuss with your cardiology the management   Dyspnea on exertion  - due to above and weight and other issues  - you do not qualify for portable o2 but can buy on own through eg: [employer] - get details from RN  Followup  - 6 to 9 months or sooner     Instructions from [place] with AO 10/28  Possible obstructive sleep apnea  We will schedule you for home sleep study  Options of treatment as discussed  We will contact you after we review the study  We will see you back in the office in 3 months       Was appointment offered to patient (explain)?  Pt wants recommendations   Reason for call: Called and spoke with pt who stated she has been having pain from both shoulders which goes from chest to upper back. Pt said symptoms began yesterday 12/2.  Pt also has been having a lot of tightness in her chest and stated usually when she has the tightness, she can take her mucinex and that will give her relief but that has not helped at all.  Pt did a neb tx overnight 12/2 which did not help with the tightness. She also has had to use rescue inhaler twice which gave her no relief.  Pt is still taking morphine which she said does help with the pain.   Pt denies any complaints of fever and stated her temp today was 97.0.   Pt does wear O2 only at bedtime. Had pt check O2 sats while on the phone and it was 87% on room air.  Pt denies any complaints of increased SOB and denies any complaints of cough  Pt wants to know what is  recommended to help with her symptoms. Aaron Edelman, please advise. Thanks!

## 2019-04-21 NOTE — ED Provider Notes (Signed)
[facility]    CSN: [id number] Arrival date & time: 04/21/19  1306      History   Chief Complaint Chief Complaint  Patient presents with   Chest Pain   Back Pain    HPI [name] is a 78 y.o. female.   HPI  Patient evaluated briefly because of concerns regarding the nurse providing triage.  The patient appears weak.  Tachycardic.  Had hypoxia on admission.  Complaining of chest pain that radiates to her back.  She does have multiple medical problems including ongoing heart disease with chronic congestive heart failure, ongoing lung disease with interstitial lung fibrosis.  She has chronic pain and takes morphine on a daily basis.  Coronary vascular disease.  Carotid vascular disease.  COPD, thyroid disease, rheumatoid arthritis.  She is in a wheelchair. She complains of shortness of breath, chest pain radiating to her back, and difficulty with low oxygen levels since yesterday.  Family member accompanying her states she has been weak and lethargic.  They called her pulmonary doctor who advised her to go to the urgent care or ER. After reviewing her vital signs, and EKG I believe she requires evaluation in the emergency department. Understand that she is anticoagulated, but worry about PE, lung disease, heart disease, coronavirus in an already compromised patient  Past Medical History:  Diagnosis Date   Acute on chronic diastolic (congestive) heart failure (HCC)    Anemia    iron deficiency anemia - secondary to blood loss ( chronic)    Arthritis    endstage changes bilateral knees/bilateral ankles.    Asthma    Carotid artery occlusion    Chronic fatigue    Chronic kidney disease    Closed left hip fracture (HCC)    Clotting disorder ([place])    pt denies this   Contusion of left knee    due to fall 1/14.   COPD (chronic obstructive pulmonary disease) (HCC)    pulmonary fibrosis   Depression, reactive    Diabetes mellitus    type II     Diastolic dysfunction    Difficulty in walking    Family history of heart disease    Generalized muscle weakness    Gout    High cholesterol    History of falling    Hypertension    Hypothyroidism    Interstitial lung disease ([place])    Meningioma of left sphenoid wing involving cavernous sinus ([place]) 02/17/2012   Continue diplopia, left eye pain and left headaches.     Morbid obesity ([place])    Morbid obesity ([place])    Neuromuscular disorder ([place])    diabetic neuropathy    Normal coronary arteries    cardiac catheterization performed  10/31/14   RA (rheumatoid arthritis) ([place])    has been off methotreaxte since 10/13.   Rheumatoid arthritis ([place])    Shortness of breath    Spinal stenosis of lumbar region    Thyroid disease    Unspecified lack of coordination    URI (upper respiratory infection)     Patient Active Problem List   Diagnosis Date Noted   Anticoagulated 06/25/2018   Chronic pain 06/25/2018   CRI (chronic renal insufficiency), stage 3 (moderate) 06/25/2018   Drug-induced constipation 06/18/2018   DVT (deep venous thrombosis) ([place]) 06/03/2018   Syncope and collapse 06/02/2018   Primary osteoarthritis of both knees 05/26/2018   Body mass index (BMI) of 50-59.9 in adult [facility]) 05/26/2018   Acquired hypothyroidism 05/26/2018  Chronic bilateral low back pain without sciatica 02/17/2018   Astigmatism with presbyopia, bilateral 03/26/2017   Cortical age-related cataract of both eyes 03/26/2017   Family history of glaucoma 03/26/2017   Long term current use of oral hypoglycemic drug 03/26/2017   Nuclear sclerotic cataract of both eyes 03/26/2017   Pain in left hip 02/18/2017   Wound infection after surgery, initial encounter 01/16/2017   Closed nondisplaced intertrochanteric fracture of left femur with delayed healing 01/14/2017   Postoperative wound infection 01/14/2017   Gout 11/29/2016   Depression 11/29/2016   Closed left  hip fracture ([place]) 11/29/2016   GERD (gastroesophageal reflux disease) 11/29/2016   Closed intertrochanteric fracture of hip, left, initial encounter ([place]) 11/29/2016   Physical deconditioning 07/31/2015   Pulmonary fibrosis, postinflammatory ([place]) 07/31/2015   Bronchiectasis without complication ([place]) 96/29/5284   Tendinitis of left rotator cuff 06/13/2015   Acute on chronic diastolic heart failure ([place]) 04/21/2015   Acute kidney injury superimposed on chronic kidney disease ([place]) 04/17/2015   Hypotension 04/17/2015   Chronic respiratory failure with hypoxia ([place]) 02/09/2015   Dyspnea and respiratory abnormality 01/03/2015   Normal coronary arteries 11/02/2014   Morbid obesity ([place]) 10/30/2014   Dysphagia 08/21/2014   Chronic fatigue 06/21/2014   DOE (dyspnea on exertion) 05/24/2014   Primary osteoarthritis of left knee 04/26/2014   High risk medication use 04/17/2014   Aphasia 02/12/2014   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 ([place]) 01/18/2014   Lumbar facet arthropathy 01/18/2014   Bilateral edema of lower extremity 05/30/2013   Chronic cough 05/24/2013   ILD (interstitial lung disease) ([place]) 04/10/2013   Greater trochanteric bursitis of left hip 08/13/2012   Spinal stenosis of lumbar region with radiculopathy 07/12/2012   Inability to walk 07/12/2012   Asymptomatic bacteriuria 07/12/2012   Meningioma of left sphenoid wing involving cavernous sinus ([place]) 02/17/2012   Chest pain with moderate risk of acute coronary syndrome 01/28/2012   Rheumatoid arthritis ([place]) 01/28/2012   Primary osteoarthritis of right knee 01/28/2012   Dyslipidemia    Thyroid disease    Essential hypertension     Past Surgical History:  Procedure Laterality Date   ABDOMINAL HYSTERECTOMY     BRAIN SURGERY     Gamma knife 10/13. Needs repeat spring  '14   CARDIAC CATHETERIZATION N/A 10/31/2014   Procedure: Right/Left Heart Cath and Coronary Angiography;   Surgeon: [name], MD;  Location: [facility];  Service: Cardiovascular;  Laterality: N/A;   ESOPHAGOGASTRODUODENOSCOPY (EGD) WITH PROPOFOL N/A 09/14/2014   Procedure: ESOPHAGOGASTRODUODENOSCOPY (EGD) WITH PROPOFOL;  Surgeon: [name], MD;  Location: [facility];  Service: Endoscopy;  Laterality: N/A;   INCISION AND DRAINAGE HIP Left 01/16/2017   Procedure: IRRIGATION AND DEBRIDEMENT LEFT HIP;  Surgeon: [name], MD;  Location: [facility];  Service: Orthopedics;  Laterality: Left;   INTRAMEDULLARY (IM) NAIL INTERTROCHANTERIC Left 11/29/2016   Procedure: INTRAMEDULLARY (IM) NAIL INTERTROCHANTRIC;  Surgeon: [name], MD;  Location: [facility];  Service: Orthopedics;  Laterality: Left;   OVARY SURGERY     SHOULDER SURGERY Left    TONSILLECTOMY  age 18   VIDEO BRONCHOSCOPY Bilateral 05/31/2013   Procedure: VIDEO BRONCHOSCOPY WITHOUT FLUORO;  Surgeon: Brand Males, MD;  Location: [facility];  Service: Cardiopulmonary;  Laterality: Bilateral;   video bronscoscopy  2000   lung    OB History   No obstetric history on file.      Home Medications    Prior to Admission medications  Medication Sig Start Date End Date Taking? Authorizing Provider  acetaminophen (TYLENOL) 500 MG tablet Take 1,000 mg by mouth every 6 (six) hours as needed for moderate pain.     [provider]  albuterol (PROVENTIL HFA;VENTOLIN HFA) 108 (90 BASE) MCG/ACT inhaler Inhale 2 puffs into the lungs every 6 (six) hours as needed for wheezing or shortness of breath.    [provider]  allopurinol (ZYLOPRIM) 300 MG tablet Take 300 mg by mouth daily.    [provider]  apixaban (ELIQUIS) 5 MG TABS tablet Take 1 tablet (5 mg total) by mouth 2 (two) times daily. 12/03/18   [name], MD  atorvastatin (LIPITOR) 20 MG tablet Take 10 mg by mouth every evening.     [provider]  azithromycin (ZITHROMAX) 500 MG tablet Take 1  tablet by mouth daily. TAKE ON MON, WED & FRI 09/20/15   [provider]  Calcium Carb-Cholecalciferol (CALCIUM + D3 PO) Take 2 tablets by mouth daily with lunch.     [provider]  cholecalciferol (VITAMIN D) 1000 UNITS tablet Take 2,000 Units by mouth daily with lunch.     [provider]  colchicine 0.6 MG tablet Take 0.6 mg by mouth daily. 06/25/16   [provider]  cyclobenzaprine (FLEXERIL) 5 MG tablet Take 1 tablet (5 mg total) by mouth at bedtime as needed for muscle spasms. 08/26/17   [name], NP  diclofenac sodium (VOLTAREN) 1 % GEL Apply 2 g topically 3 (three) times daily. Both hands and knees 02/08/19   [name], NP  docusate sodium (COLACE) 100 MG capsule Take 1 capsule (100 mg total) by mouth 2 (two) times daily. 12/02/16   Oswald Hillock, MD  DULoxetine (CYMBALTA) 60 MG capsule Take 1 capsule (60 mg total) by mouth daily. 02/08/19   [name], NP  fluticasone (FLONASE) 50 MCG/ACT nasal spray Place 2 sprays into both nostrils daily. 12/03/18   [name], MD  furosemide (LASIX) 20 MG tablet Take 3 tablets (60 mg total) by mouth daily. 03/18/19   [name], MD  gabapentin (NEURONTIN) 600 MG tablet Take two in the morning , Two mid- afternoon and two at bedtime 02/08/19   [name], NP  guaiFENesin (MUCINEX) 600 MG 12 hr tablet Take 1,200 mg by mouth 2 (two) times daily.    [provider]  hydroxychloroquine (PLAQUENIL) 200 MG tablet Take 400 mg by mouth daily.     [provider]  ipratropium-albuterol (DUONEB) 0.5-2.5 (3) MG/3ML SOLN Take 3 mLs by nebulization every 6 (six) hours as needed (cough/wheezing).     [provider]  leflunomide (ARAVA) 10 MG tablet Take 10 mg by mouth daily.    [provider]  levothyroxine (SYNTHROID) 50 MCG tablet Take 1 tablet (50 mcg total) by mouth daily. 12/03/18   [name], MD  linaclotide [facility]) 72 MCG capsule Take 1 capsule (72  mcg total) by mouth daily before breakfast. 12/03/18   [name], MD  metoprolol succinate (TOPROL XL) 100 MG 24 hr tablet Take 1/2 tablet by mouth daily with or immediately following a meal. 10/14/18   [name], MD  morphine (MS CONTIN) 15 MG 12 hr tablet Take 1 tablet (15 mg total) by mouth every 12 (twelve) hours. Do Not Fill Before `05/01/2019 04/04/19   [name], NP  morphine (MSIR) 15 MG tablet Take 1 tablet (15 mg total) by mouth  2 (two) times daily as needed for moderate pain ([facility]). 04/04/19   [name], NP  Multiple Vitamin (MULTIVITAMIN WITH MINERALS) TABS Take 1 tablet by mouth daily.     [provider]  olmesartan (BENICAR) 40 MG tablet Take 1 tablet (40 mg total) by mouth daily. 02/02/19   [name], MD  OXYGEN-HELIUM IN Inhale 2 L into the lungs at bedtime. And on exertion    [provider]  pantoprazole (PROTONIX) 40 MG tablet take 1 tablet by mouth once daily MINUTES BEFORE 1ST MEAL OF THE DAY 12/03/18   [name], MD  potassium chloride (K-DUR) 10 MEQ tablet Take 1 tablet (10 mEq total) by mouth daily. 12/03/18   [name], MD  predniSONE (DELTASONE) 5 MG tablet Take 10 mg by mouth every morning.  06/20/15   [provider]  Semaglutide,0.25 or 0.5MG/DOS, (OZEMPIC, 0.25 OR 0.5 MG/DOSE,) 2 MG/1.5ML SOPN Inject 0.5 mg into the skin once a week. 12/03/18   [name], MD  sodium chloride (OCEAN) 0.65 % SOLN nasal spray Place 1 spray into both nostrils as needed for congestion.    [provider]  Spacer/Aero-Holding Chambers (AEROCHAMBER MV) inhaler Use as instructed 06/10/13   Parrett, Fonnie Mu, NP    Family History Family History  Problem Relation Age of Onset   Diabetes Mother    Heart attack Mother    Hypertension Father    Lung cancer Father    Diabetes Sister    Diabetes Brother    Hypertension Brother    Heart disease Brother    Heart attack Brother    Kidney  cancer Brother    Uterine cancer Daughter    Breast cancer Sister    Rheum arthritis Maternal Uncle    Gout Brother    Kidney failure Brother        x 5    Social History Social History   Tobacco Use   Smoking status: Never Smoker   Smokeless tobacco: Never Used  Substance Use Topics   Alcohol use: No   Drug use: No     Allergies   Codeine, Infliximab, and Lisinopril   Review of Systems Review of Systems  Constitutional: Positive for fatigue. Negative for chills and fever.  HENT: Negative for ear pain and sore throat.   Eyes: Negative for pain and visual disturbance.  Respiratory: Positive for shortness of breath. Negative for cough.   Cardiovascular: Positive for chest pain. Negative for palpitations.  Gastrointestinal: Negative for abdominal pain and vomiting.  Genitourinary: Negative for dysuria and hematuria.  Musculoskeletal: Negative for arthralgias and back pain.  Skin: Negative for color change and rash.  Neurological: Positive for weakness. Negative for seizures and syncope.  All other systems reviewed and are negative.    Physical Exam Triage Vital Signs ED Triage Vitals  Enc Vitals Group     BP 04/21/19 1401 115/61     Pulse Rate 04/21/19 1401 (!) 126     Resp 04/21/19 1401 (!) 24     Temp 04/21/19 1401 98.5 F (36.9 C)     Temp Source 04/21/19 1401 Oral     SpO2 04/21/19 1401 93 %     Weight --      Height --      Head Circumference --      Peak Flow --      Pain Score 04/21/19 1359 4     Pain Loc --  Pain Edu? --      Excl. in [place]? --    No data found.  Updated Vital Signs BP (S) 115/61 (BP Location: Left Wrist) Comment (BP Location): left WRIST   Pulse (!) 126    Temp 98.5 F (36.9 C) (Oral)    Resp (!) 24    SpO2 93% Comment: placed on 2Liters of O2, up to 99%    Physical Exam Constitutional:      General: She is not in acute distress.    Appearance: She is well-developed. She is obese. She is ill-appearing.      Comments: Patient slumped back in wheelchair.  Weak voice.  HENT:     Head: Normocephalic and atraumatic.  Eyes:     Conjunctiva/sclera: Conjunctivae normal.     Pupils: Pupils are equal, round, and reactive to light.  Neck:     Musculoskeletal: Normal range of motion.  Cardiovascular:     Rate and Rhythm: Tachycardia present.  Pulmonary:     Effort: Respiratory distress present.     Comments: Dyspnea Abdominal:     General: There is no distension.     Palpations: Abdomen is soft.  Musculoskeletal: Normal range of motion.  Skin:    General: Skin is warm and dry.  Neurological:     Mental Status: She is alert.  Psychiatric:     Comments: Weak voice, slightly slow responses      UC Treatments / Results  Labs (all labs ordered are listed, but only abnormal results are displayed) Labs Reviewed - No data to display  EKG   Radiology No results found.  Procedures Procedures (including critical care time)  Medications Ordered in UC Medications - No data to display  Initial Impression / Assessment and Plan / UC Course  I have reviewed the triage vital signs and the nursing notes.  Pertinent labs & imaging results that were available during my care of the patient were reviewed by me and considered in my medical decision making (see chart for details).      Final Clinical Impressions(s) / UC Diagnoses   Final diagnoses:  Weakness  Other chest pain  Acute respiratory distress     Discharge Instructions     You need to be evaluated in the ER   ED Prescriptions    None     PDMP not reviewed this encounter.   Raylene Everts, MD 04/21/19 [phone]

## 2019-04-21 NOTE — Progress Notes (Signed)
ANTICOAGULATION CONSULT NOTE - Initial Consult  Pharmacy Consult for heparin Indication: ? pulmonary embolus  Allergies  Allergen Reactions  . Codeine Swelling and Other (See Comments)    Facial swelling Chest pain and swelling in legs  Facial swelling  . Infliximab Anaphylaxis    "sent me into shock" "sent me into shock"  . Lisinopril Swelling    Face and neck swelling Face and neck swelling    Patient Measurements: Height: 5' 4" (162.6 cm) Weight: 261 lb 14.5 oz (118.8 kg) IBW/kg (Calculated) : 54.7 Heparin Dosing Weight: 83.5kg  Vital Signs: Temp: 98.9 F (37.2 C) (12/03 1417) Temp Source: Oral (12/03 1417) BP: 99/60 (12/03 1956) Pulse Rate: 118 (12/03 1956)  Labs: Recent Labs    04/21/19 1539 04/21/19 1702  HGB 12.8  --   HCT 43.5  --   PLT 154  --   CREATININE 2.18*  --   TROPONINIHS 11 9    Estimated Creatinine Clearance: 27.4 mL/min (A) (by C-G formula based on SCr of 2.18 mg/dL (H)).   Medical History: Past Medical History:  Diagnosis Date  . Acute on chronic diastolic (congestive) heart failure ([place])   . Anemia    iron deficiency anemia - secondary to blood loss ( chronic)   . Arthritis    endstage changes bilateral knees/bilateral ankles.   . Asthma   . Carotid artery occlusion   . Chronic fatigue   . Chronic kidney disease   . Closed left hip fracture ([place])   . Clotting disorder ([place])    pt denies this  . Contusion of left knee    due to fall 1/14.  [name] COPD (chronic obstructive pulmonary disease) (HCC)    pulmonary fibrosis  . Depression, reactive   . Diabetes mellitus    type II   . Diastolic dysfunction   . Difficulty in walking   . Family history of heart disease   . Generalized muscle weakness   . Gout   . High cholesterol   . History of falling   . Hypertension   . Hypothyroidism   . Interstitial lung disease ([place])   . Meningioma of left sphenoid wing involving cavernous sinus (HCC) 02/17/2012   Continue diplopia, left eye  pain and left headaches.    . Morbid obesity ([place])   . Morbid obesity ([place])   . Neuromuscular disorder ([place])    diabetic neuropathy   . Normal coronary arteries    cardiac catheterization performed  10/31/14  . RA (rheumatoid arthritis) ([place])    has been off methotreaxte since 10/13.  [name] Rheumatoid arthritis ([place])   . Shortness of breath   . Spinal stenosis of lumbar region   . Thyroid disease   . Unspecified lack of coordination   . URI (upper respiratory infection)     Medications:  Infusions:  . heparin      Assessment: 44 yof presented to the ED with CP and weakness. To start IV heparin for possible PE. Planning VQ scan due to kidney dysfunction. Baseline CBC is WNL. She was previously on apixaban for a DVT but this has been stopped.   Goal of Therapy:  Heparin level 0.3-0.7 units/ml Monitor platelets by anticoagulation protocol: Yes   Plan:  Heparin bolus 5000 units IV x 1 Heparin gtt 1000 units/hr Check an 8 hr heparin level Daily heparin level and CBC  Chenae Brager, Rande Lawman 04/21/2019,9:20 PM

## 2019-04-21 NOTE — Discharge Instructions (Addendum)
You need to be evaluated in the ER

## 2019-04-21 NOTE — ED Provider Notes (Signed)
[facility] EMERGENCY DEPARTMENT Provider Note   CSN: [id number] Arrival date & time: 04/21/19  1411     History   Chief Complaint Chief Complaint  Patient presents with  . Chest Pain    HPI [name] is a 78 y.o. female.     HPI Patient presents with chest pain and weakness.  Pain began last night has been somewhat weak over the last few days though.  More short of breath.  Somewhat decreased oral intake.  Has been requiring oxygen through the day when she normally only has it at night.  History of interstitial lung disease.  Had previously been on Eliquis but not currently on it.  No fevers or chills.  Pain is in her anterior chest and goes to the upper back.  Has increased swelling in her legs.  States her weight is up.  However blood pressures been down to the 70s at home.  No fevers.  Seen in urgent care and sent down here.  Has had some chest pain before but usually not like this. Past Medical History:  Diagnosis Date  . Acute on chronic diastolic (congestive) heart failure ([place])   . Anemia    iron deficiency anemia - secondary to blood loss ( chronic)   . Arthritis    endstage changes bilateral knees/bilateral ankles.   . Asthma   . Carotid artery occlusion   . Chronic fatigue   . Chronic kidney disease   . Closed left hip fracture ([place])   . Clotting disorder ([place])    pt denies this  . Contusion of left knee    due to fall 1/14.  [name] COPD (chronic obstructive pulmonary disease) (HCC)    pulmonary fibrosis  . Depression, reactive   . Diabetes mellitus    type II   . Diastolic dysfunction   . Difficulty in walking   . Family history of heart disease   . Generalized muscle weakness   . Gout   . High cholesterol   . History of falling   . Hypertension   . Hypothyroidism   . Interstitial lung disease ([place])   . 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     . Morbid obesity ([place])   . Morbid obesity ([place])   . Neuromuscular disorder ([place])    diabetic neuropathy   . Normal coronary arteries    cardiac catheterization performed  10/31/14  . RA (rheumatoid arthritis) ([place])    has been off methotreaxte since 10/13.  [name] Rheumatoid arthritis ([place])   . Shortness of breath   . Spinal stenosis of lumbar region   . Thyroid disease   . Unspecified lack of coordination   . URI (upper respiratory infection)     Patient Active Problem List   Diagnosis Date Noted  . Anticoagulated 06/25/2018  . Chronic pain 06/25/2018  . CRI (chronic renal insufficiency), stage 3 (moderate) 06/25/2018  . Drug-induced constipation 06/18/2018  . DVT (deep venous thrombosis) ([place]) 06/03/2018  . Syncope and collapse 06/02/2018  . Primary osteoarthritis of both knees 05/26/2018  . Body mass index (BMI) of 50-59.9 in adult ([place]) 05/26/2018  . Acquired hypothyroidism 05/26/2018  . Chronic bilateral low back pain without sciatica 02/17/2018  . Astigmatism with presbyopia, bilateral 03/26/2017  . Cortical age-related cataract of both eyes 03/26/2017  . Family history of glaucoma 03/26/2017  . Long term current use of oral hypoglycemic  drug 03/26/2017  . Nuclear sclerotic cataract of both eyes 03/26/2017  . Pain in left hip 02/18/2017  . Wound infection after surgery, initial encounter 01/16/2017  . Closed nondisplaced intertrochanteric fracture of left femur with delayed healing 01/14/2017  . Postoperative wound infection 01/14/2017  . Gout 11/29/2016  . Depression 11/29/2016  . Closed left hip fracture ([place]) 11/29/2016  . GERD (gastroesophageal reflux disease) 11/29/2016  . Closed intertrochanteric fracture of hip, left, initial encounter ([place]) 11/29/2016  . Physical deconditioning 07/31/2015  . Pulmonary fibrosis, postinflammatory ([place]) 07/31/2015  . Bronchiectasis without complication ([place]) 35/32/9924  . Tendinitis of left rotator cuff 06/13/2015  . Acute on  chronic diastolic heart failure ([place]) 04/21/2015  . Acute kidney injury superimposed on chronic kidney disease ([place]) 04/17/2015  . Hypotension 04/17/2015  . Chronic respiratory failure with hypoxia ([place]) 02/09/2015  . Dyspnea and respiratory abnormality 01/03/2015  . Normal coronary arteries 11/02/2014  . Morbid obesity ([place]) 10/30/2014  . Dysphagia 08/21/2014  . Chronic fatigue 06/21/2014  . DOE (dyspnea on exertion) 05/24/2014  . Primary osteoarthritis of left knee 04/26/2014  . High risk medication use 04/17/2014  . Aphasia 02/12/2014  . 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 Lumbar facet arthropathy 01/18/2014  . Bilateral edema of lower extremity 05/30/2013  . Chronic cough 05/24/2013  . ILD (interstitial lung disease) ([place]) 04/10/2013  . Greater trochanteric bursitis of left hip 08/13/2012  . Spinal stenosis of lumbar region with radiculopathy 07/12/2012  . Inability to walk 07/12/2012  . Asymptomatic bacteriuria 07/12/2012  . Meningioma of left sphenoid wing involving cavernous sinus ([place]) 02/17/2012  . Chest pain with moderate risk of acute coronary syndrome 01/28/2012  . Rheumatoid arthritis ([place]) 01/28/2012  . Primary osteoarthritis of right knee 01/28/2012  . Dyslipidemia   . Thyroid disease   . Essential hypertension     Past Surgical History:  Procedure Laterality Date  . ABDOMINAL HYSTERECTOMY    . BRAIN SURGERY     Gamma knife 10/13. Needs repeat spring  '14  . CARDIAC CATHETERIZATION N/A 10/31/2014   Procedure: Right/Left Heart Cath and Coronary Angiography;  Surgeon: [name], MD;  Location: [facility];  Service: Cardiovascular;  Laterality: N/A;  . ESOPHAGOGASTRODUODENOSCOPY (EGD) WITH PROPOFOL N/A 09/14/2014   Procedure: ESOPHAGOGASTRODUODENOSCOPY (EGD) WITH PROPOFOL;  Surgeon: [name], MD;  Location: [facility];  Service: Endoscopy;  Laterality: N/A;  . INCISION AND DRAINAGE HIP Left 01/16/2017    Procedure: IRRIGATION AND DEBRIDEMENT LEFT HIP;  Surgeon: [name], MD;  Location: [facility];  Service: Orthopedics;  Laterality: Left;  . INTRAMEDULLARY (IM) NAIL INTERTROCHANTERIC Left 11/29/2016   Procedure: INTRAMEDULLARY (IM) NAIL INTERTROCHANTRIC;  Surgeon: [name], MD;  Location: [facility];  Service: Orthopedics;  Laterality: Left;  . OVARY SURGERY    . SHOULDER SURGERY Left   . TONSILLECTOMY  age 51  . VIDEO BRONCHOSCOPY Bilateral 05/31/2013   Procedure: VIDEO BRONCHOSCOPY WITHOUT FLUORO;  Surgeon: Brand Males, MD;  Location: [facility];  Service: Cardiopulmonary;  Laterality: Bilateral;  . video bronscoscopy  2000   lung     OB History   No obstetric history on file.      Home Medications    Prior to Admission medications   Medication Sig Start Date End Date Taking? Authorizing Provider  acetaminophen (TYLENOL) 500 MG tablet Take 1,000 mg by mouth every 6 (six) hours as needed for moderate pain.    Yes [provider]  albuterol (  PROVENTIL HFA;VENTOLIN HFA) 108 (90 BASE) MCG/ACT inhaler Inhale 2 puffs into the lungs every 6 (six) hours as needed for wheezing or shortness of breath.   Yes [provider]  allopurinol (ZYLOPRIM) 300 MG tablet Take 300 mg by mouth daily.   Yes [provider]  aspirin buffered (BUFFERIN) 325 MG TABS tablet Take 325 mg by mouth daily.   Yes [provider]  atorvastatin (LIPITOR) 20 MG tablet Take 10 mg by mouth every evening.    Yes [provider]  azithromycin (ZITHROMAX) 500 MG tablet Take 1 tablet by mouth 3 (three) times a week. TAKE ON MON, WED & FRI 09/20/15  Yes [provider]  Calcium Carb-Cholecalciferol (CALCIUM + D3 PO) Take 2 tablets by mouth daily with lunch.    Yes [provider]  cholecalciferol (VITAMIN D) 1000 UNITS tablet Take 2,000 Units by mouth daily with lunch.    Yes [provider]  colchicine 0.6 MG tablet Take 0.6 mg by  mouth daily. 06/25/16  Yes [provider]  cyclobenzaprine (FLEXERIL) 5 MG tablet Take 1 tablet (5 mg total) by mouth at bedtime as needed for muscle spasms. Patient taking differently: Take 5 mg by mouth at bedtime.  08/26/17  Yes [name], NP  diclofenac sodium (VOLTAREN) 1 % GEL Apply 2 g topically 3 (three) times daily. Both hands and knees 02/08/19  Yes Danella Sensing L, NP  DULoxetine (CYMBALTA) 60 MG capsule Take 1 capsule (60 mg total) by mouth daily. 02/08/19  Yes [name], NP  fluticasone (FLONASE) 50 MCG/ACT nasal spray Place 2 sprays into both nostrils daily. 12/03/18  Yes [name], MD  furosemide (LASIX) 20 MG tablet Take 3 tablets (60 mg total) by mouth daily. 03/18/19  Yes [name], MD  gabapentin (NEURONTIN) 600 MG tablet Take two in the morning , Two mid- afternoon and two at bedtime Patient taking differently: Take 1,200 mg by mouth 3 (three) times daily. Take two in the morning , Two mid- afternoon and two at bedtime 02/08/19  Yes [name], NP  guaiFENesin (MUCINEX) 600 MG 12 hr tablet Take 1,200 mg by mouth 2 (two) times daily.   Yes [provider]  hydroxychloroquine (PLAQUENIL) 200 MG tablet Take 400 mg by mouth daily.    Yes [provider]  ipratropium-albuterol (DUONEB) 0.5-2.5 (3) MG/3ML SOLN Take 3 mLs by nebulization every 6 (six) hours as needed (cough/wheezing).    Yes [provider]  leflunomide (ARAVA) 10 MG tablet Take 10 mg by mouth daily.   Yes [provider]  levothyroxine (SYNTHROID) 50 MCG tablet Take 1 tablet (50 mcg total) by mouth daily. 12/03/18  Yes [name], MD  linaclotide [facility]) 72 MCG capsule Take 1 capsule (72 mcg total) by mouth daily before breakfast. 12/03/18  Yes [name], MD  metoprolol succinate (TOPROL XL) 100 MG 24 hr tablet Take 1/2 tablet by mouth daily with or immediately following a meal. 10/14/18  Yes [name], MD  morphine (MS CONTIN)  15 MG 12 hr tablet Take 1 tablet (15 mg total) by mouth every 12 (twelve) hours. Do Not Fill Before `05/01/2019 04/04/19  Yes [name], NP  morphine (MSIR) 15 MG tablet Take 1 tablet (15 mg total) by mouth 2 (two) times daily as needed for moderate pain (Breakthrough Pain). Patient taking differently: Take 15 mg by mouth See admin instructions. Take one tablet every morning then every two  hours as needed for break through pain 04/04/19  Yes [name], NP  Multiple Vitamin (MULTIVITAMIN WITH MINERALS) TABS Take 1 tablet by mouth daily.    Yes [provider]  olmesartan (BENICAR) 40 MG tablet Take 1 tablet (40 mg total) by mouth daily. 02/02/19  Yes [name], MD  OXYGEN-HELIUM IN Inhale 2 L into the lungs at bedtime. And on exertion   Yes [provider]  pantoprazole (PROTONIX) 40 MG tablet take 1 tablet by mouth once daily MINUTES BEFORE 1ST MEAL OF THE DAY 12/03/18  Yes [name], MD  potassium chloride (K-DUR) 10 MEQ tablet Take 1 tablet (10 mEq total) by mouth daily. 12/03/18  Yes [name], MD  predniSONE (DELTASONE) 5 MG tablet Take 10 mg by mouth every morning.  06/20/15  Yes [provider]  Semaglutide,0.25 or 0.5MG/DOS, (OZEMPIC, 0.25 OR 0.5 MG/DOSE,) 2 MG/1.5ML SOPN Inject 0.5 mg into the skin once a week. 12/03/18  Yes [name], MD  sodium chloride (OCEAN) 0.65 % SOLN nasal spray Place 1 spray into both nostrils as needed for congestion.   Yes [provider]  Spacer/Aero-Holding Chambers (AEROCHAMBER MV) inhaler Use as instructed 06/10/13  Yes Parrett, Tammy S, NP  apixaban (ELIQUIS) 5 MG TABS tablet Take 1 tablet (5 mg total) by mouth 2 (two) times daily. Patient not taking: Reported on 04/21/2019 12/03/18   [name], MD  docusate sodium (COLACE) 100 MG capsule Take 1 capsule (100 mg total) by mouth 2 (two) times daily. Patient not taking: Reported on 04/21/2019 12/02/16   Oswald Hillock, MD    Family History  Family History  Problem Relation Age of Onset  . Diabetes Mother   . Heart attack Mother   . Hypertension Father   . Lung cancer Father   . Diabetes Sister   . Diabetes Brother   . Hypertension Brother   . Heart disease Brother   . Heart attack Brother   . Kidney cancer Brother   . Uterine cancer Daughter   . Breast cancer Sister   . Rheum arthritis Maternal Uncle   . Gout Brother   . Kidney failure Brother        x 5    Social History Social History   Tobacco Use  . Smoking status: Never Smoker  . Smokeless tobacco: Never Used  Substance Use Topics  . Alcohol use: No  . Drug use: No     Allergies   Codeine, Infliximab, and Lisinopril   Review of Systems Review of Systems  Constitutional: Positive for fatigue. Negative for appetite change.  HENT: Negative for congestion.   Respiratory: Positive for shortness of breath.   Cardiovascular: Positive for chest pain and leg swelling.  Gastrointestinal: Negative for abdominal pain.  Genitourinary: Negative for flank pain.  Musculoskeletal: Positive for back pain.  Skin: Negative for rash.  Neurological: Negative for weakness.  Psychiatric/Behavioral: Negative for confusion.     Physical Exam Updated Vital Signs BP 99/60   Pulse (!) 118   Temp 98.9 F (37.2 C) (Oral)   Resp (!) 21   Ht _0  (1.626 m)   Wt 118.8 kg   SpO2 95%   BMI 44.96 kg/m   Physical Exam Vitals signs and nursing note reviewed.  HENT:     Head: Atraumatic.  Cardiovascular:     Rate and Rhythm: Tachycardia present.  Pulmonary:     Comments: Harsh breath sounds on right.  Decreased  breath sounds on left.  Some tachypnea.  Mild anterior chest tenderness. Abdominal:     Tenderness: There is no abdominal tenderness.  Musculoskeletal:     Comments: Moderate pitting edema bilateral lower extremities.  Erythema on the left  Skin:    General: Skin is warm.     Capillary Refill: Capillary refill takes less than 2 seconds.   Neurological:     Mental Status: She is alert and oriented to person, place, and time.      ED Treatments / Results  Labs (all labs ordered are listed, but only abnormal results are displayed) Labs Reviewed  BASIC METABOLIC PANEL - Abnormal; Notable for the following components:      Result Value   Potassium 3.0 (*)    Glucose, Bld 231 (*)    Creatinine, Ser 2.18 (*)    Calcium 8.4 (*)    GFR calc non Af Amer 21 (*)    GFR calc Af Amer 25 (*)    All other components within normal limits  CBC - Abnormal; Notable for the following components:   MCHC 29.4 (*)    All other components within normal limits  HEPATIC FUNCTION PANEL - Abnormal; Notable for the following components:   Total Protein 5.5 (*)    Albumin 3.0 (*)    All other components within normal limits  BRAIN NATRIURETIC PEPTIDE  URINALYSIS, ROUTINE W REFLEX MICROSCOPIC  POC SARS CORONAVIRUS 2 AG -  ED  TROPONIN I (HIGH SENSITIVITY)  TROPONIN I (HIGH SENSITIVITY)    EKG EKG Interpretation  Date/Time:  Thursday April 21 2019 14:14:41 EST Ventricular Rate:  125 PR Interval:  136 QRS Duration: 80 QT Interval:  334 QTC Calculation: 482 R Axis:   -47 Text Interpretation: Sinus tachycardia Left anterior fascicular block Possible Lateral infarct , age undetermined Abnormal ECG Confirmed by Pattricia Boss [phone]) on 04/21/2019 2:38:30 PM   Radiology Dg Chest 2 View  Result Date: 04/21/2019 CLINICAL DATA:  Chest pain EXAM: CHEST - 2 VIEW COMPARISON:  June 02, 2018 FINDINGS: There is a 6 x 6 mm nodular opacity in the right mid lung region. There is no edema or consolidation. Heart size and pulmonary vascularity are normal. No adenopathy. No bone lesions. IMPRESSION: 6 x 6 mm nodular opacity right mid lung. Advise noncontrast enhanced chest CT to further assess. No edema or consolidation. Cardiac silhouette normal. No adenopathy. Electronically Signed   By: Lowella Grip III M.D.   On: 04/21/2019 15:38   Ct  Chest Wo Contrast  Result Date: 04/21/2019 CLINICAL DATA:  Chest pain, shortness of breath. EXAM: CT CHEST WITHOUT CONTRAST TECHNIQUE: Multidetector CT imaging of the chest was performed following the standard protocol without IV contrast. COMPARISON:  Radiograph of same day. CT scan of November 24, 2018. FINDINGS: Cardiovascular: No significant vascular findings. Normal heart size. No pericardial effusion. Mediastinum/Nodes: No enlarged mediastinal or axillary lymph nodes. Thyroid gland, trachea, and esophagus demonstrate no significant findings. Lungs/Pleura: No pneumothorax or pleural effusion is noted. Stable bronchiectasis and probable scarring is noted in both lower lobes. No definite acute abnormality is noted. Upper Abdomen: No acute abnormality. Musculoskeletal: No chest wall mass or suspicious bone lesions identified. IMPRESSION: Stable bronchiectasis and probable scarring is noted in both lower lobes. No acute abnormality seen in the chest. Electronically Signed   By: Marijo Conception M.D.   On: 04/21/2019 19:25    Procedures Procedures (including critical care time)  Medications Ordered in ED Medications  sodium chloride 0.9 %  bolus 500 mL (500 mLs Intravenous New Bag/Given 04/21/19 1959)     Initial Impression / Assessment and Plan / ED Course  I have reviewed the triage vital signs and the nursing notes.  Pertinent labs & imaging results that were available during my care of the patient were reviewed by me and considered in my medical decision making (see chart for details).        Patient presented to with chest pain tachycardia and increasing oxygen requirement.  Renal insufficiency is worse than what appeared to be her baseline but states that she is recently had a creatinine of 3 and is closer to today.  X-ray had potential nodule but CT scan done reassuring.  Chronic lung disease.  Potentially could have a cellulitis of the left leg.  Has had some hypotension was had decreased oral  intake.  Continued tachycardia improved some with IV fluids.  Will admit to internal medicine.  Final Clinical Impressions(s) / ED Diagnoses   Final diagnoses:  Chest pain, unspecified type  Interstitial lung disease [facility])    ED Discharge Orders    None       Davonna Belling, MD 04/29/19 1601

## 2019-04-21 NOTE — ED Triage Notes (Addendum)
Patient presents to Urgent Care with complaints of upper chest pain radiating to her back and desaturation since yesterday. Patient reports she has a chronic lung condition that requires oxygen at night, pt has beenusing O2 more during the day as well. Pt lethargic during assessment, HR 130.  Dr. Meda Coffee notified, bedside at this time.

## 2019-04-21 NOTE — H&P (Signed)
History and Physical    [name] XHB:[id number] DOB: [birth year] DOA: 04/21/2019  PCP: [name], MD Patient coming from: Home  Chief Complaint: Chest pain, shortness of breath  HPI: [name] is a 78 y.o. female with medical history significant of chronic diastolic congestive heart failure, CKD, asthma, COPD, interstitial lung disease, chronic respiratory failure on 2 L home oxygen at night, depression, type 2 diabetes, hyperlipidemia, hypertension, hypothyroidism, morbid obesity (BMI 44.96), gout, clotting disorder, history of DVT previously on Eliquis, rheumatoid arthritis presenting with complaints of chest pain and shortness of breath.  Patient states since yesterday she has been having chest pain and shortness of breath even at rest.  The chest pain is substernal and right-sided.  It radiates to her back.  She is not sure what the pain feels like.  Denies cough.  She normally uses 2 L home oxygen only at night but has been requiring it even during the day for the past few days.  States she had a blood clot in her left leg previously for which she was on Eliquis which was stopped about a month ago by her doctor.  States she is not active at baseline as she is not able to move around much.  Denies history of cigarette smoking.  States her kidney doctor told her her kidney function has been getting worse.  Denies nausea, vomiting, or abdominal pain.  Reports systolic blood pressure of 80s this morning and she did not take her blood pressure medications today.  ED Course: Tachycardic with heart rate up to 120s.  Blood pressure soft.  EKG showing sinus tachycardia and Q waves in lateral leads.  High-sensitivity troponin x2 negative.  Chest x-ray showing a 6 x 6 mm nodular opacity in the right midlung; no edema or consolidation.  CT chest without contrast showing stable bronchiectasis and probable scarring in both lower lobes; no acute abnormality. Received a 500 cc normal saline  bolus.  Started on heparin infusion for suspected PE.  CT angiogram could not be done due to renal insufficiency.  Review of Systems:  All systems reviewed and apart from history of presenting illness, are negative.  Past Medical History:  Diagnosis Date   Acute on chronic diastolic (congestive) heart failure (HCC)    Anemia    iron deficiency anemia - secondary to blood loss ( chronic)    Arthritis    endstage changes bilateral knees/bilateral ankles.    Asthma    Carotid artery occlusion    Chronic fatigue    Chronic kidney disease    Closed left hip fracture (HCC)    Clotting disorder ([place])    pt denies this   Contusion of left knee    due to fall 1/14.   COPD (chronic obstructive pulmonary disease) (HCC)    pulmonary fibrosis   Depression, reactive    Diabetes mellitus    type II    Diastolic dysfunction    Difficulty in walking    Family history of heart disease    Generalized muscle weakness    Gout    High cholesterol    History of falling    Hypertension    Hypothyroidism    Interstitial lung disease ([place])    Meningioma of left sphenoid wing involving cavernous sinus ([place]) 02/17/2012   Continue diplopia, left eye pain and left headaches.     Morbid obesity ([place])    Morbid obesity ([place])    Neuromuscular disorder ([place])    diabetic  neuropathy    Normal coronary arteries    cardiac catheterization performed  10/31/14   RA (rheumatoid arthritis) ([place])    has been off methotreaxte since 10/13.   Rheumatoid arthritis (HCC)    Shortness of breath    Spinal stenosis of lumbar region    Thyroid disease    Unspecified lack of coordination    URI (upper respiratory infection)     Past Surgical History:  Procedure Laterality Date   ABDOMINAL HYSTERECTOMY     BRAIN SURGERY     Gamma knife 10/13. Needs repeat spring  '14   CARDIAC CATHETERIZATION N/A 10/31/2014   Procedure: Right/Left Heart Cath and Coronary Angiography;  Surgeon:  [name], MD;  Location: [facility];  Service: Cardiovascular;  Laterality: N/A;   ESOPHAGOGASTRODUODENOSCOPY (EGD) WITH PROPOFOL N/A 09/14/2014   Procedure: ESOPHAGOGASTRODUODENOSCOPY (EGD) WITH PROPOFOL;  Surgeon: [name], MD;  Location: [facility];  Service: Endoscopy;  Laterality: N/A;   INCISION AND DRAINAGE HIP Left 01/16/2017   Procedure: IRRIGATION AND DEBRIDEMENT LEFT HIP;  Surgeon: [name], MD;  Location: [facility];  Service: Orthopedics;  Laterality: Left;   INTRAMEDULLARY (IM) NAIL INTERTROCHANTERIC Left 11/29/2016   Procedure: INTRAMEDULLARY (IM) NAIL INTERTROCHANTRIC;  Surgeon: [name], MD;  Location: [facility];  Service: Orthopedics;  Laterality: Left;   OVARY SURGERY     SHOULDER SURGERY Left    TONSILLECTOMY  age 86   VIDEO BRONCHOSCOPY Bilateral 05/31/2013   Procedure: VIDEO BRONCHOSCOPY WITHOUT FLUORO;  Surgeon: Brand Males, MD;  Location: [facility];  Service: Cardiopulmonary;  Laterality: Bilateral;   video bronscoscopy  2000   lung     reports that she has never smoked. She has never used smokeless tobacco. She reports that she does not drink alcohol or use drugs.  Allergies  Allergen Reactions   Codeine Swelling and Other (See Comments)    Facial swelling Chest pain and swelling in legs  Facial swelling   Infliximab Anaphylaxis    "sent me into shock" "sent me into shock"   Lisinopril Swelling    Face and neck swelling Face and neck swelling    Family History  Problem Relation Age of Onset   Diabetes Mother    Heart attack Mother    Hypertension Father    Lung cancer Father    Diabetes Sister    Diabetes Brother    Hypertension Brother    Heart disease Brother    Heart attack Brother    Kidney cancer Brother    Uterine cancer Daughter    Breast cancer Sister    Rheum arthritis Maternal Uncle    Gout Brother    Kidney failure Brother        x 5    Prior to  Admission medications   Medication Sig Start Date End Date Taking? Authorizing Provider  acetaminophen (TYLENOL) 500 MG tablet Take 1,000 mg by mouth every 6 (six) hours as needed for moderate pain.    Yes [provider]  albuterol (PROVENTIL HFA;VENTOLIN HFA) 108 (90 BASE) MCG/ACT inhaler Inhale 2 puffs into the lungs every 6 (six) hours as needed for wheezing or shortness of breath.   Yes [provider]  allopurinol (ZYLOPRIM) 300 MG tablet Take 300 mg by mouth daily.   Yes [provider]  aspirin buffered (BUFFERIN) 325 MG TABS tablet Take 325 mg by mouth daily.   Yes [provider]  atorvastatin (LIPITOR) 20 MG tablet Take 10 mg by  mouth every evening.    Yes [provider]  azithromycin (ZITHROMAX) 500 MG tablet Take 1 tablet by mouth 3 (three) times a week. TAKE ON MON, WED & FRI 09/20/15  Yes [provider]  Calcium Carb-Cholecalciferol (CALCIUM + D3 PO) Take 2 tablets by mouth daily with lunch.    Yes [provider]  cholecalciferol (VITAMIN D) 1000 UNITS tablet Take 2,000 Units by mouth daily with lunch.    Yes [provider]  colchicine 0.6 MG tablet Take 0.6 mg by mouth daily. 06/25/16  Yes [provider]  cyclobenzaprine (FLEXERIL) 5 MG tablet Take 1 tablet (5 mg total) by mouth at bedtime as needed for muscle spasms. Patient taking differently: Take 5 mg by mouth at bedtime.  08/26/17  Yes [name], NP  diclofenac sodium (VOLTAREN) 1 % GEL Apply 2 g topically 3 (three) times daily. Both hands and knees 02/08/19  Yes Danella Sensing L, NP  DULoxetine (CYMBALTA) 60 MG capsule Take 1 capsule (60 mg total) by mouth daily. 02/08/19  Yes [name], NP  fluticasone (FLONASE) 50 MCG/ACT nasal spray Place 2 sprays into both nostrils daily. 12/03/18  Yes [name], MD  furosemide (LASIX) 20 MG tablet Take 3 tablets (60 mg total) by mouth daily. 03/18/19  Yes [name], MD  gabapentin  (NEURONTIN) 600 MG tablet Take two in the morning , Two mid- afternoon and two at bedtime Patient taking differently: Take 1,200 mg by mouth 3 (three) times daily. Take two in the morning , Two mid- afternoon and two at bedtime 02/08/19  Yes [name], NP  guaiFENesin (MUCINEX) 600 MG 12 hr tablet Take 1,200 mg by mouth 2 (two) times daily.   Yes [provider]  hydroxychloroquine (PLAQUENIL) 200 MG tablet Take 400 mg by mouth daily.    Yes [provider]  ipratropium-albuterol (DUONEB) 0.5-2.5 (3) MG/3ML SOLN Take 3 mLs by nebulization every 6 (six) hours as needed (cough/wheezing).    Yes [provider]  leflunomide (ARAVA) 10 MG tablet Take 10 mg by mouth daily.   Yes [provider]  levothyroxine (SYNTHROID) 50 MCG tablet Take 1 tablet (50 mcg total) by mouth daily. 12/03/18  Yes [name], MD  linaclotide [facility]) 72 MCG capsule Take 1 capsule (72 mcg total) by mouth daily before breakfast. 12/03/18  Yes [name], MD  metoprolol succinate (TOPROL XL) 100 MG 24 hr tablet Take 1/2 tablet by mouth daily with or immediately following a meal. 10/14/18  Yes [name], MD  morphine (MS CONTIN) 15 MG 12 hr tablet Take 1 tablet (15 mg total) by mouth every 12 (twelve) hours. Do Not Fill Before `05/01/2019 04/04/19  Yes [name], NP  morphine (MSIR) 15 MG tablet Take 1 tablet (15 mg total) by mouth 2 (two) times daily as needed for moderate pain (Breakthrough Pain). Patient taking differently: Take 15 mg by mouth See admin instructions. Take one tablet every morning then every two hours as needed for break through pain 04/04/19  Yes [name], NP  Multiple Vitamin (MULTIVITAMIN WITH MINERALS) TABS Take 1 tablet by mouth daily.    Yes [provider]  olmesartan (BENICAR) 40 MG tablet Take 1 tablet (40 mg total) by mouth daily. 02/02/19  Yes [name], MD  OXYGEN-HELIUM IN Inhale 2 L into the lungs at bedtime. And  on exertion   Yes [provider]  pantoprazole (PROTONIX) 40  MG tablet take 1 tablet by mouth once daily MINUTES BEFORE 1ST MEAL OF THE DAY 12/03/18  Yes [name], MD  potassium chloride (K-DUR) 10 MEQ tablet Take 1 tablet (10 mEq total) by mouth daily. 12/03/18  Yes [name], MD  predniSONE (DELTASONE) 5 MG tablet Take 10 mg by mouth every morning.  06/20/15  Yes [provider]  Semaglutide,0.25 or 0.5MG/DOS, (OZEMPIC, 0.25 OR 0.5 MG/DOSE,) 2 MG/1.5ML SOPN Inject 0.5 mg into the skin once a week. 12/03/18  Yes [name], MD  sodium chloride (OCEAN) 0.65 % SOLN nasal spray Place 1 spray into both nostrils as needed for congestion.   Yes [provider]  Spacer/Aero-Holding Chambers (AEROCHAMBER MV) inhaler Use as instructed 06/10/13  Yes Parrett, Tammy S, NP  apixaban (ELIQUIS) 5 MG TABS tablet Take 1 tablet (5 mg total) by mouth 2 (two) times daily. Patient not taking: Reported on 04/21/2019 12/03/18   [name], MD  docusate sodium (COLACE) 100 MG capsule Take 1 capsule (100 mg total) by mouth 2 (two) times daily. Patient not taking: Reported on 04/21/2019 12/02/16   Oswald Hillock, MD    Physical Exam: Vitals:   04/21/19 1700 04/21/19 1800 04/21/19 1843 04/21/19 1956  BP: 104/65 104/60 (!) 93/55 99/60  Pulse: (!) 116 (!) 115 (!) 119 (!) 118  Resp: _0 (!) 21  Temp:      TempSrc:      SpO2: 93% 94% 97% 95%  Weight:      Height:        Physical Exam  Constitutional: She is oriented to person, place, and time. She appears well-developed and well-nourished. No distress.  HENT:  Head: Normocephalic.  Eyes: Right eye exhibits no discharge. Left eye exhibits no discharge.  Neck: Neck supple.  Cardiovascular: Normal rate, regular rhythm and intact distal pulses.  Pulmonary/Chest: Effort normal. No respiratory distress. She has no wheezes. She has no rales.  Anterior lung fields clear to auscultation  Abdominal: Soft. Bowel sounds are  normal. She exhibits no distension. There is no abdominal tenderness. There is no guarding.  Musculoskeletal:        General: Edema present.     Comments: Bilateral lower extremity edema  Neurological: She is alert and oriented to person, place, and time.  Skin: Skin is warm and dry. She is not diaphoretic. There is erythema.  Erythema and warmth to touch of bilateral lower extremities in the mid shin/calf region. Left lower extremity appears significantly worse.     Labs on Admission: I have personally reviewed following labs and imaging studies  CBC: Recent Labs  Lab 04/21/19 1539  WBC 6.4  HGB 12.8  HCT 43.5  MCV 99.3  PLT 403   Basic Metabolic Panel: Recent Labs  Lab 04/21/19 1539  NA 140  K 3.0*  CL 102  CO2 25  GLUCOSE 231*  BUN 10  CREATININE 2.18*  CALCIUM 8.4*   GFR: Estimated Creatinine Clearance: 27.4 mL/min (A) (by C-G formula based on SCr of 2.18 mg/dL (H)). Liver Function Tests: Recent Labs  Lab 04/21/19 1539  AST 33  ALT 36  ALKPHOS 94  BILITOT 0.6  PROT 5.5*  ALBUMIN 3.0*   No results for input(s): LIPASE, AMYLASE in the last 168 hours. No results for input(s): AMMONIA in the last 168 hours. Coagulation Profile: No results for input(s): INR, PROTIME in the last 168 hours. Cardiac Enzymes: No results for input(s): CKTOTAL, CKMB, CKMBINDEX, TROPONINI in  the last 168 hours. BNP (last 3 results) No results for input(s): PROBNP in the last 8760 hours. HbA1C: No results for input(s): HGBA1C in the last 72 hours. CBG: No results for input(s): GLUCAP in the last 168 hours. Lipid Profile: No results for input(s): CHOL, HDL, LDLCALC, TRIG, CHOLHDL, LDLDIRECT in the last 72 hours. Thyroid Function Tests: No results for input(s): TSH, T4TOTAL, FREET4, T3FREE, THYROIDAB in the last 72 hours. Anemia Panel: No results for input(s): VITAMINB12, FOLATE, FERRITIN, TIBC, IRON, RETICCTPCT in the last 72 hours. Urine analysis:    Component Value  Date/Time   COLORURINE YELLOW 06/02/2018 1850   APPEARANCEUR CLOUDY (A) 06/02/2018 1850   LABSPEC 1.016 06/02/2018 1850   PHURINE 5.0 06/02/2018 1850   GLUCOSEU NEGATIVE 06/02/2018 1850   HGBUR SMALL (A) 06/02/2018 1850   BILIRUBINUR NEGATIVE 06/02/2018 [place] 06/02/2018 1850   PROTEINUR NEGATIVE 06/02/2018 1850   UROBILINOGEN 1.0 07/12/2012 1234   NITRITE NEGATIVE 06/02/2018 1850   LEUKOCYTESUR LARGE (A) 06/02/2018 1850    Radiological Exams on Admission: Dg Chest 2 View  Result Date: 04/21/2019 CLINICAL DATA:  Chest pain EXAM: CHEST - 2 VIEW COMPARISON:  June 02, 2018 FINDINGS: There is a 6 x 6 mm nodular opacity in the right mid lung region. There is no edema or consolidation. Heart size and pulmonary vascularity are normal. No adenopathy. No bone lesions. IMPRESSION: 6 x 6 mm nodular opacity right mid lung. Advise noncontrast enhanced chest CT to further assess. No edema or consolidation. Cardiac silhouette normal. No adenopathy. Electronically Signed   By: Lowella Grip III M.D.   On: 04/21/2019 15:38   Ct Chest Wo Contrast  Result Date: 04/21/2019 CLINICAL DATA:  Chest pain, shortness of breath. EXAM: CT CHEST WITHOUT CONTRAST TECHNIQUE: Multidetector CT imaging of the chest was performed following the standard protocol without IV contrast. COMPARISON:  Radiograph of same day. CT scan of November 24, 2018. FINDINGS: Cardiovascular: No significant vascular findings. Normal heart size. No pericardial effusion. Mediastinum/Nodes: No enlarged mediastinal or axillary lymph nodes. Thyroid gland, trachea, and esophagus demonstrate no significant findings. Lungs/Pleura: No pneumothorax or pleural effusion is noted. Stable bronchiectasis and probable scarring is noted in both lower lobes. No definite acute abnormality is noted. Upper Abdomen: No acute abnormality. Musculoskeletal: No chest wall mass or suspicious bone lesions identified. IMPRESSION: Stable bronchiectasis and  probable scarring is noted in both lower lobes. No acute abnormality seen in the chest. Electronically Signed   By: Marijo Conception M.D.   On: 04/21/2019 19:25    EKG: Independently reviewed.  Sinus tachycardia, LAFB.  Q waves in lateral leads.  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  At present, physical examination concerning for signs of DVT, especially the left lower extremity.  Patient is morbidly obese and sedentary at baseline.  There is concern for PE given significant risk factors, chest pain, dyspnea, tachycardia, hypotension, and increased supplemental oxygen requirement from baseline.  Work-up done in the ED not suggestive of ACS; high-sensitivity troponin x2 negative.  BNP normal. -Cardiac monitoring -Heparin has been started empirically given high suspicion for PE -Unable to do CT angiogram due to worsening renal insufficiency.  VQ scan has  been ordered. -Continuous pulse ox, supplemental oxygen as needed -Bilateral lower extremity Dopplers  Hypotension No infectious signs or symptoms.  Suspecting PE as mentioned above.  Also could be related to home antihypertensive use.  Blood pressure currently soft.  Received a 500 cc fluid bolus. -Hold home blood pressure medications -IV fluid resuscitation to keep MAP >65  Hypokalemia Potassium 3.0.  Likely related to home Lasix use. -Replete potassium.  Check magnesium level and replete if low.  Continue to monitor electrolytes. -Hold Lasix  Non-insulin-dependent type 2 diabetes Blood glucose 231. -Check A1c.  Sliding scale insulin sensitive and CBG checks.  AKI versus progressively worsening CKD BUN, creatinine 2.1.  Baseline creatinine 1.0-1.1 about a  year ago.  No recent labs for comparison.  Patient states her nephrologist told her her renal function has been worsening.  ?Prerenal due to hypotension.  Takes Lasix and olmesartan at home. -IV fluid as above for hypotension -Hold Lasix and olmesartan.  Avoid any other nephrotoxic agents and contrast. -Monitor urine output -Continue to monitor renal function -Renal ultrasound  Chronic diastolic congestive heart failure BNP normal.  CT without evidence of pulmonary edema. -Hold diuretic and ARB at this time  Hypertension -Hold antihypertensives at this time given hypotension  Interstitial lung disease -Stable.  Combivent as needed.  Gout -Continue allopurinol  Hyperlipidemia -Continue Lipitor  Chronic pain/neuropathy -Continue gabapentin  Rheumatoid arthritis -Continue Plaquenil and leflunomide  Hypothyroidism -Continue Synthroid -Check TSH level  GERD -Continue PPI  DVT prophylaxis: Heparin Code Status: Patient wishes to be full code. Family Communication: Daughter at bedside. Disposition Plan: Anticipate discharge after clinical improvement. Consults called: None Admission status: It is my clinical opinion that referral for OBSERVATION is reasonable and necessary in this patient based on the above information provided. The aforementioned taken together are felt to place the patient at high risk for further clinical deterioration. However it is anticipated that the patient may be medically stable for discharge from the hospital within 24 to 48 hours.  The medical decision making on this patient was of high complexity and the patient is at high risk for clinical deterioration, therefore this is a level 3 visit.  Shela Leff MD [facility] Pager [phone]  If 7PM-7AM, please contact night-coverage [email or web address] Password [facility]  04/21/2019, 10:57 PM

## 2019-04-21 NOTE — Telephone Encounter (Signed)
Called and spoke with pt letting her know the info stated by Aaron Edelman. Pt verbalized understanding. Nothing further needed.

## 2019-04-21 NOTE — Telephone Encounter (Signed)
These are abnormal symptoms.  Persistent chest pain is typically not resolved by albuterol.  Patient should seek emergent evaluation at an urgent care or an emergency room due to her persistent chest pain.  Patient can call 911.  Patient can also follow-up with primary care regarding their recommendations.  Wyn Quaker, FNP

## 2019-04-22 ENCOUNTER — Inpatient Hospital Stay (HOSPITAL_COMMUNITY): Payer: Medicare Other

## 2019-04-22 ENCOUNTER — Observation Stay (HOSPITAL_COMMUNITY): Payer: Medicare Other

## 2019-04-22 ENCOUNTER — Encounter (HOSPITAL_COMMUNITY): Payer: Self-pay | Admitting: General Practice

## 2019-04-22 DIAGNOSIS — I2699 Other pulmonary embolism without acute cor pulmonale: Secondary | ICD-10-CM | POA: Diagnosis not present

## 2019-04-22 DIAGNOSIS — Z9981 Dependence on supplemental oxygen: Secondary | ICD-10-CM | POA: Diagnosis not present

## 2019-04-22 DIAGNOSIS — L03116 Cellulitis of left lower limb: Secondary | ICD-10-CM | POA: Diagnosis present

## 2019-04-22 DIAGNOSIS — N390 Urinary tract infection, site not specified: Secondary | ICD-10-CM | POA: Diagnosis present

## 2019-04-22 DIAGNOSIS — R6510 Systemic inflammatory response syndrome (SIRS) of non-infectious origin without acute organ dysfunction: Secondary | ICD-10-CM

## 2019-04-22 DIAGNOSIS — R197 Diarrhea, unspecified: Secondary | ICD-10-CM | POA: Diagnosis present

## 2019-04-22 DIAGNOSIS — Z888 Allergy status to other drugs, medicaments and biological substances status: Secondary | ICD-10-CM | POA: Diagnosis not present

## 2019-04-22 DIAGNOSIS — E079 Disorder of thyroid, unspecified: Secondary | ICD-10-CM | POA: Diagnosis not present

## 2019-04-22 DIAGNOSIS — D689 Coagulation defect, unspecified: Secondary | ICD-10-CM | POA: Diagnosis present

## 2019-04-22 DIAGNOSIS — E861 Hypovolemia: Secondary | ICD-10-CM | POA: Diagnosis not present

## 2019-04-22 DIAGNOSIS — E1122 Type 2 diabetes mellitus with diabetic chronic kidney disease: Secondary | ICD-10-CM | POA: Diagnosis present

## 2019-04-22 DIAGNOSIS — E876 Hypokalemia: Secondary | ICD-10-CM | POA: Diagnosis present

## 2019-04-22 DIAGNOSIS — K219 Gastro-esophageal reflux disease without esophagitis: Secondary | ICD-10-CM | POA: Diagnosis present

## 2019-04-22 DIAGNOSIS — M7989 Other specified soft tissue disorders: Secondary | ICD-10-CM | POA: Diagnosis not present

## 2019-04-22 DIAGNOSIS — R0602 Shortness of breath: Secondary | ICD-10-CM | POA: Diagnosis not present

## 2019-04-22 DIAGNOSIS — R071 Chest pain on breathing: Secondary | ICD-10-CM | POA: Diagnosis not present

## 2019-04-22 DIAGNOSIS — N1831 Chronic kidney disease, stage 3a: Secondary | ICD-10-CM | POA: Diagnosis present

## 2019-04-22 DIAGNOSIS — M109 Gout, unspecified: Secondary | ICD-10-CM | POA: Diagnosis present

## 2019-04-22 DIAGNOSIS — I5032 Chronic diastolic (congestive) heart failure: Secondary | ICD-10-CM | POA: Diagnosis present

## 2019-04-22 DIAGNOSIS — R52 Pain, unspecified: Secondary | ICD-10-CM

## 2019-04-22 DIAGNOSIS — N179 Acute kidney failure, unspecified: Secondary | ICD-10-CM | POA: Diagnosis present

## 2019-04-22 DIAGNOSIS — Z20828 Contact with and (suspected) exposure to other viral communicable diseases: Secondary | ICD-10-CM | POA: Diagnosis present

## 2019-04-22 DIAGNOSIS — Z6841 Body Mass Index (BMI) 40.0 and over, adult: Secondary | ICD-10-CM | POA: Diagnosis not present

## 2019-04-22 DIAGNOSIS — E1140 Type 2 diabetes mellitus with diabetic neuropathy, unspecified: Secondary | ICD-10-CM | POA: Diagnosis not present

## 2019-04-22 DIAGNOSIS — L03119 Cellulitis of unspecified part of limb: Secondary | ICD-10-CM | POA: Diagnosis not present

## 2019-04-22 DIAGNOSIS — I9589 Other hypotension: Secondary | ICD-10-CM | POA: Diagnosis not present

## 2019-04-22 DIAGNOSIS — L03115 Cellulitis of right lower limb: Secondary | ICD-10-CM | POA: Diagnosis present

## 2019-04-22 DIAGNOSIS — Z885 Allergy status to narcotic agent status: Secondary | ICD-10-CM | POA: Diagnosis not present

## 2019-04-22 DIAGNOSIS — M069 Rheumatoid arthritis, unspecified: Secondary | ICD-10-CM | POA: Diagnosis present

## 2019-04-22 DIAGNOSIS — E1142 Type 2 diabetes mellitus with diabetic polyneuropathy: Secondary | ICD-10-CM | POA: Diagnosis present

## 2019-04-22 DIAGNOSIS — R6520 Severe sepsis without septic shock: Secondary | ICD-10-CM | POA: Diagnosis not present

## 2019-04-22 DIAGNOSIS — E039 Hypothyroidism, unspecified: Secondary | ICD-10-CM | POA: Diagnosis present

## 2019-04-22 DIAGNOSIS — I130 Hypertensive heart and chronic kidney disease with heart failure and stage 1 through stage 4 chronic kidney disease, or unspecified chronic kidney disease: Secondary | ICD-10-CM | POA: Diagnosis present

## 2019-04-22 DIAGNOSIS — J9611 Chronic respiratory failure with hypoxia: Secondary | ICD-10-CM | POA: Diagnosis present

## 2019-04-22 DIAGNOSIS — M1990 Unspecified osteoarthritis, unspecified site: Secondary | ICD-10-CM | POA: Diagnosis present

## 2019-04-22 DIAGNOSIS — I959 Hypotension, unspecified: Secondary | ICD-10-CM | POA: Diagnosis not present

## 2019-04-22 DIAGNOSIS — A419 Sepsis, unspecified organism: Secondary | ICD-10-CM | POA: Diagnosis present

## 2019-04-22 LAB — URINALYSIS, ROUTINE W REFLEX MICROSCOPIC
Glucose, UA: NEGATIVE mg/dL
Ketones, ur: 5 mg/dL — AB
Nitrite: NEGATIVE
Protein, ur: 100 mg/dL — AB
Specific Gravity, Urine: 1.023 (ref 1.005–1.030)
WBC, UA: >50 WBC/hpf — ABNORMAL HIGH (ref 0–5)
pH: 5 (ref 5.0–8.0)

## 2019-04-22 LAB — CBC
HCT: 39.4 % (ref 36.0–46.0)
Hemoglobin: 11.6 g/dL — ABNORMAL LOW (ref 12.0–15.0)
MCH: 30.1 pg (ref 26.0–34.0)
MCHC: 29.4 g/dL — ABNORMAL LOW (ref 30.0–36.0)
MCV: 102.3 fL — ABNORMAL HIGH (ref 80.0–100.0)
Platelets: 145 10*3/uL — ABNORMAL LOW (ref 150–400)
RBC: 3.85 MIL/uL — ABNORMAL LOW (ref 3.87–5.11)
RDW: 15.8 % — ABNORMAL HIGH (ref 11.5–15.5)
WBC: 6.3 10*3/uL (ref 4.0–10.5)
nRBC: 0 % (ref 0.0–0.2)

## 2019-04-22 LAB — HEPARIN LEVEL (UNFRACTIONATED)
Heparin Unfractionated: <0.1 IU/mL — ABNORMAL LOW (ref 0.30–0.70)
Heparin Unfractionated: <0.1 IU/mL — ABNORMAL LOW (ref 0.30–0.70)

## 2019-04-22 LAB — MAGNESIUM: Magnesium: 1.5 mg/dL — ABNORMAL LOW (ref 1.7–2.4)

## 2019-04-22 LAB — GLUCOSE, CAPILLARY
Glucose-Capillary: 161 mg/dL — ABNORMAL HIGH (ref 70–99)
Glucose-Capillary: 162 mg/dL — ABNORMAL HIGH (ref 70–99)

## 2019-04-22 LAB — BASIC METABOLIC PANEL
Anion gap: 12 (ref 5–15)
BUN: 10 mg/dL (ref 8–23)
CO2: 22 mmol/L (ref 22–32)
Calcium: 8.1 mg/dL — ABNORMAL LOW (ref 8.9–10.3)
Chloride: 106 mmol/L (ref 98–111)
Creatinine, Ser: 2.75 mg/dL — ABNORMAL HIGH (ref 0.44–1.00)
GFR calc Af Amer: 19 mL/min — ABNORMAL LOW (ref >60)
GFR calc non Af Amer: 16 mL/min — ABNORMAL LOW (ref >60)
Glucose, Bld: 162 mg/dL — ABNORMAL HIGH (ref 70–99)
Potassium: 3.2 mmol/L — ABNORMAL LOW (ref 3.5–5.1)
Sodium: 140 mmol/L (ref 135–145)

## 2019-04-22 LAB — LACTIC ACID, PLASMA
Lactic Acid, Venous: 1.2 mmol/L (ref 0.5–1.9)
Lactic Acid, Venous: 1.9 mmol/L (ref 0.5–1.9)

## 2019-04-22 LAB — CBG MONITORING, ED: Glucose-Capillary: 161 mg/dL — ABNORMAL HIGH (ref 70–99)

## 2019-04-22 LAB — TSH: TSH: 2.256 u[IU]/mL (ref 0.350–4.500)

## 2019-04-22 LAB — HEMOGLOBIN A1C
Hgb A1c MFr Bld: 7.4 % — ABNORMAL HIGH (ref 4.8–5.6)
Mean Plasma Glucose: 165.68 mg/dL

## 2019-04-22 LAB — SARS CORONAVIRUS 2 (TAT 6-24 HRS): SARS Coronavirus 2: NEGATIVE

## 2019-04-22 MED ORDER — SODIUM CHLORIDE 0.9 % IV SOLN
2.0000 g | INTRAVENOUS | Status: DC
  Administered 2019-04-22 – 2019-04-23 (×2): 2 g via INTRAVENOUS
  Filled 2019-04-22 (×3): qty 2

## 2019-04-22 MED ORDER — SODIUM CHLORIDE 0.9 % IV BOLUS
500.0000 mL | Freq: Once | INTRAVENOUS | Status: AC
  Administered 2019-04-22: 500 mL via INTRAVENOUS

## 2019-04-22 MED ORDER — SODIUM CHLORIDE 0.9 % IV BOLUS
1000.0000 mL | Freq: Once | INTRAVENOUS | Status: AC
  Administered 2019-04-22: 1000 mL via INTRAVENOUS

## 2019-04-22 MED ORDER — VANCOMYCIN HCL 10 G IV SOLR
2000.0000 mg | Freq: Once | INTRAVENOUS | Status: AC
  Administered 2019-04-22: 2000 mg via INTRAVENOUS
  Filled 2019-04-22 (×4): qty 2000

## 2019-04-22 MED ORDER — SODIUM CHLORIDE 0.9 % IV SOLN: 1.0000 g | Freq: Once | INTRAVENOUS | Status: DC

## 2019-04-22 MED ORDER — HEPARIN BOLUS VIA INFUSION
2000.0000 [IU] | Freq: Once | INTRAVENOUS | Status: AC
  Administered 2019-04-22: 2000 [IU] via INTRAVENOUS
  Filled 2019-04-22: qty 2000

## 2019-04-22 MED ORDER — MAGNESIUM SULFATE 2 GM/50ML IV SOLN
2.0000 g | Freq: Once | INTRAVENOUS | Status: AC
  Administered 2019-04-22: 2 g via INTRAVENOUS
  Filled 2019-04-22: qty 50

## 2019-04-22 MED ORDER — POTASSIUM CHLORIDE CRYS ER 20 MEQ PO TBCR
20.0000 meq | EXTENDED_RELEASE_TABLET | Freq: Once | ORAL | Status: AC
  Administered 2019-04-22: 20 meq via ORAL
  Filled 2019-04-22: qty 1

## 2019-04-22 MED ORDER — VANCOMYCIN HCL IN DEXTROSE 1-5 GM/200ML-% IV SOLN: 1000.0000 mg | Freq: Once | INTRAVENOUS | Status: DC

## 2019-04-22 MED ORDER — TECHNETIUM TO 99M ALBUMIN AGGREGATED
1.5000 | Freq: Once | INTRAVENOUS | Status: AC | PRN
  Administered 2019-04-22: 1.5 via INTRAVENOUS

## 2019-04-22 MED ORDER — SODIUM CHLORIDE 0.9 % IV SOLN
INTRAVENOUS | Status: DC
  Administered 2019-04-22 – 2019-04-27 (×10): via INTRAVENOUS

## 2019-04-22 NOTE — ED Notes (Signed)
ED TO INPATIENT HANDOFF REPORT  ED Nurse Name and Phone #: Percell Locus, RN  S Name/Age/Gender [name] 78 y.o. female Room/Bed: 040C/040C  Code Status   Code Status: Full Code  Home/SNF/Other Home Patient oriented to: self, place, time and situation Is this baseline? Yes   Triage Complete: Triage complete  Chief Complaint SOB  Triage Note Pt arrives POV for eval of persistent chest pain radiating through her back onset last evening w/ associated worsening SOB. Pt wears 2LPM [place] at night, but reports that she has been requiring more throughout the day. Pt is lethargic in triage, tachy to 120s-130s. Denies known sick contacts.    Allergies Allergies  Allergen Reactions  . Codeine Swelling and Other (See Comments)    Facial swelling Chest pain and swelling in legs  Facial swelling  . Infliximab Anaphylaxis    "sent me into shock" "sent me into shock"  . Lisinopril Swelling    Face and neck swelling Face and neck swelling    Level of Care/Admitting Diagnosis ED Disposition    ED Disposition Condition [facility] Area: [place] [100100]  Level of Care: Progressive [102]  Admit to Progressive based on following criteria: RESPIRATORY PROBLEMS hypoxemic/hypercapnic respiratory failure that is responsive to NIPPV (BiPAP) or High Flow Nasal Cannula (6-80 lpm). Frequent assessment/intervention, no > Q2 hrs < Q4 hrs, to maintain oxygenation and pulmonary hygiene.  Covid Evaluation: Asymptomatic Screening Protocol (No Symptoms)  Diagnosis: Chest pain [161096]  Admitting Physician: Shela Leff [[id number]]  Attending Physician: Shela Leff [[id number]]  PT Class (Do Not Modify): Observation [104]  PT Acc Code (Do Not Modify): Observation [10022]       B Medical/Surgery History Past Medical History:  Diagnosis Date  . Acute on chronic diastolic (congestive) heart failure ([place])   . Anemia    iron deficiency anemia - secondary  to blood loss ( chronic)   . Arthritis    endstage changes bilateral knees/bilateral ankles.   . Asthma   . Carotid artery occlusion   . Chronic fatigue   . Chronic kidney disease   . Closed left hip fracture ([place])   . Clotting disorder ([place])    pt denies this  . Contusion of left knee    due to fall 1/14.  [name] COPD (chronic obstructive pulmonary disease) (HCC)    pulmonary fibrosis  . Depression, reactive   . Diabetes mellitus    type II   . Diastolic dysfunction   . Difficulty in walking   . Family history of heart disease   . Generalized muscle weakness   . Gout   . High cholesterol   . History of falling   . Hypertension   . Hypothyroidism   . Interstitial lung disease ([place])   . 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    . Morbid obesity ([place])   . Morbid obesity ([place])   . Neuromuscular disorder ([place])    diabetic neuropathy   . Normal coronary arteries    cardiac catheterization performed  10/31/14  . RA (rheumatoid arthritis) ([place])    has been off methotreaxte since 10/13.  [name] Rheumatoid arthritis ([place])   . Shortness of breath   . Spinal stenosis of lumbar region   . Thyroid disease   . Unspecified lack of coordination   . URI (upper respiratory infection)    Past Surgical History:  Procedure  Laterality Date  . ABDOMINAL HYSTERECTOMY    . BRAIN SURGERY     Gamma knife 10/13. Needs repeat spring  '14  . CARDIAC CATHETERIZATION N/A 10/31/2014   Procedure: Right/Left Heart Cath and Coronary Angiography;  Surgeon: [name], MD;  Location: [facility];  Service: Cardiovascular;  Laterality: N/A;  . ESOPHAGOGASTRODUODENOSCOPY (EGD) WITH PROPOFOL N/A 09/14/2014   Procedure: ESOPHAGOGASTRODUODENOSCOPY (EGD) WITH PROPOFOL;  Surgeon: [name], MD;  Location: [facility];  Service: Endoscopy;  Laterality: N/A;  . INCISION AND DRAINAGE HIP Left 01/16/2017    Procedure: IRRIGATION AND DEBRIDEMENT LEFT HIP;  Surgeon: [name], MD;  Location: [facility];  Service: Orthopedics;  Laterality: Left;  . INTRAMEDULLARY (IM) NAIL INTERTROCHANTERIC Left 11/29/2016   Procedure: INTRAMEDULLARY (IM) NAIL INTERTROCHANTRIC;  Surgeon: [name], MD;  Location: [facility];  Service: Orthopedics;  Laterality: Left;  . OVARY SURGERY    . SHOULDER SURGERY Left   . TONSILLECTOMY  age 44  . VIDEO BRONCHOSCOPY Bilateral 05/31/2013   Procedure: VIDEO BRONCHOSCOPY WITHOUT FLUORO;  Surgeon: Brand Males, MD;  Location: [facility];  Service: Cardiopulmonary;  Laterality: Bilateral;  . video bronscoscopy  2000   lung     A IV Location/Drains/Wounds Patient Lines/Drains/Airways Status   Active Line/Drains/Airways    Name:   Placement date:   Placement time:   Site:   Days:   Peripheral IV 04/21/19 Right Antecubital   04/21/19    1536    Antecubital   1   Peripheral IV 04/21/19 Left Antecubital   04/21/19    2150    Antecubital   1   External Urinary Catheter   04/22/19    0626    -   less than 1   Incision (Closed) 01/16/17 Hip Left   01/16/17    1535     826   Wound 09/15/11 Other (Comment) Right;Lower 2CM OPEN WOUND WITH MODERATE PUS DRAINAGE   09/15/11    1459    -   2776          Intake/Output Last 24 hours  Intake/Output Summary (Last 24 hours) at 04/22/2019 1136 Last data filed at 04/22/2019 1055 Gross per 24 hour  Intake 46.26 ml  Output -  Net 46.26 ml    Labs/Imaging Results for orders placed or performed during the hospital encounter of 04/21/19 (from the past 48 hour(s))  Brain natriuretic peptide     Status: None   Collection Time: 04/21/19  3:36 PM  Result Value Ref Range   B Natriuretic Peptide 10.8 0.0 - 100.0 pg/mL    Comment: Performed at [facility], [address], [place], [place] [address]  Basic metabolic panel     Status: Abnormal   Collection Time: 04/21/19  3:39 PM  Result Value Ref Range   Sodium  140 135 - 145 mmol/L   Potassium 3.0 (L) 3.5 - 5.1 mmol/L   Chloride 102 98 - 111 mmol/L   CO2 25 22 - 32 mmol/L   Glucose, Bld 231 (H) 70 - 99 mg/dL   BUN 10 8 - 23 mg/dL   Creatinine, Ser 2.18 (H) 0.44 - 1.00 mg/dL   Calcium 8.4 (L) 8.9 - 10.3 mg/dL   GFR calc non Af Amer 21 (L) >60 mL/min   GFR calc Af Amer 25 (L) >60 mL/min   Anion gap 13 5 - 15    Comment: Performed at [facility] [address]  [place], [place] [address]  CBC     Status: Abnormal   Collection Time: 04/21/19  3:39 PM  Result Value Ref Range   WBC 6.4 4.0 - 10.5 K/uL   RBC 4.38 3.87 - 5.11 MIL/uL   Hemoglobin 12.8 12.0 - 15.0 g/dL   HCT 43.5 36.0 - 46.0 %   MCV 99.3 80.0 - 100.0 fL   MCH 29.2 26.0 - 34.0 pg   MCHC 29.4 (L) 30.0 - 36.0 g/dL   RDW 15.5 11.5 - 15.5 %   Platelets 154 150 - 400 K/uL   nRBC 0.0 0.0 - 0.2 %    Comment: Performed at [facility], [place] [address] [place], [place] [address]  Troponin I (High Sensitivity)     Status: None   Collection Time: 04/21/19  3:39 PM  Result Value Ref Range   Troponin I (High Sensitivity) 11 <18 ng/L    Comment: (NOTE) Elevated high sensitivity troponin I (hsTnI) values and significant  changes across serial measurements may suggest ACS but many other  chronic and acute conditions are known to elevate hsTnI results.  Refer to the "Links" section for chest pain algorithms and additional  guidance. Performed at [facility], [place] [address], [place], [place] [address]   Hepatic function panel     Status: Abnormal   Collection Time: 04/21/19  3:39 PM  Result Value Ref Range   Total Protein 5.5 (L) 6.5 - 8.1 g/dL   Albumin 3.0 (L) 3.5 - 5.0 g/dL   AST 33 15 - 41 U/L   ALT 36 0 - 44 U/L   Alkaline Phosphatase 94 38 - 126 U/L   Total Bilirubin 0.6 0.3 - 1.2 mg/dL   Bilirubin, Direct 0.1 0.0 - 0.2 mg/dL   Indirect Bilirubin 0.5 0.3 - 0.9 mg/dL    Comment: Performed at [facility] [address], [place], [place] [address]  POC  SARS Coronavirus 2 Ag-ED - Nasal Swab (BD Veritor Kit)     Status: None   Collection Time: 04/21/19  4:20 PM  Result Value Ref Range   SARS Coronavirus 2 Ag NEGATIVE NEGATIVE    Comment: (NOTE) SARS-CoV-2 antigen NOT DETECTED.  Negative results are presumptive.  Negative results do not preclude SARS-CoV-2 infection and should not be used as the sole basis for treatment or other patient management decisions, including infection  control decisions, particularly in the presence of clinical signs and  symptoms consistent with COVID-19, or in those who have been in contact with the virus.  Negative results must be combined with clinical observations, patient history, and epidemiological information. The expected result is Negative. Fact Sheet for Patients: PodPark.tn Fact Sheet for Healthcare Providers: GiftContent.is This test is not yet approved or cleared by the [place] FDA and  has been authorized for detection and/or diagnosis of SARS-CoV-2 by FDA under an Emergency Use Authorization (EUA).  This EUA will remain in effect (meaning this test can be used) for the duration of  the COVID-19 de claration under Section 564(b)(1) of the Act, 21 U.S.C. section 360bbb-3(b)(1), unless the authorization is terminated or revoked sooner.   Troponin I (High Sensitivity)     Status: None   Collection Time: 04/21/19  5:02 PM  Result Value Ref Range   Troponin I (High Sensitivity) 9 <18 ng/L    Comment: (NOTE) Elevated high sensitivity troponin I (hsTnI) values and significant  changes across serial measurements may suggest ACS but many other  chronic  and acute conditions are known to elevate hsTnI results.  Refer to the "Links" section for chest pain algorithms and additional  guidance. Performed at [facility], [place] [address] [place], [place] [address]   SARS CORONAVIRUS 2 (TAT 6-24 HRS) Nasopharyngeal Nasopharyngeal Swab      Status: None   Collection Time: 04/21/19 10:10 PM   Specimen: Nasopharyngeal Swab  Result Value Ref Range   SARS Coronavirus 2 NEGATIVE NEGATIVE    Comment: (NOTE) SARS-CoV-2 target nucleic acids are NOT DETECTED. The SARS-CoV-2 RNA is generally detectable in upper and lower respiratory specimens during the acute phase of infection. Negative results do not preclude SARS-CoV-2 infection, do not rule out co-infections with other pathogens, and should not be used as the sole basis for treatment or other patient management decisions. Negative results must be combined with clinical observations, patient history, and epidemiological information. The expected result is Negative. Fact Sheet for Patients: SugarRoll.be Fact Sheet for Healthcare Providers: https://www.woods-mathews.com/ This test is not yet approved or cleared by the [place] FDA and  has been authorized for detection and/or diagnosis of SARS-CoV-2 by FDA under an Emergency Use Authorization (EUA). This EUA will remain  in effect (meaning this test can be used) for the duration of the COVID-19 declaration under Section 56 4(b)(1) of the Act, 21 U.S.C. section 360bbb-3(b)(1), unless the authorization is terminated or revoked sooner. Performed at [facility], [place] [address], [place], [place] [address]   CBG monitoring, ED     Status: Abnormal   Collection Time: 04/21/19 11:34 PM  Result Value Ref Range   Glucose-Capillary 153 (H) 70 - 99 mg/dL  Magnesium     Status: Abnormal   Collection Time: 04/22/19  2:13 AM  Result Value Ref Range   Magnesium 1.5 (L) 1.7 - 2.4 mg/dL    Comment: Performed at [facility] [address], [place], [place] [address]  Hemoglobin A1c     Status: Abnormal   Collection Time: 04/22/19  2:13 AM  Result Value Ref Range   Hgb A1c MFr Bld 7.4 (H) 4.8 - 5.6 %    Comment: (NOTE) Pre diabetes:          5.7%-6.4% Diabetes:               >6.4% Glycemic control for   <7.0% adults with diabetes    Mean Plasma Glucose 165.68 mg/dL    Comment: Performed at [facility] [address], [place], [place] [address]  TSH     Status: None   Collection Time: 04/22/19  2:13 AM  Result Value Ref Range   TSH 2.256 0.350 - 4.500 uIU/mL    Comment: Performed by a 3rd Generation assay with a functional sensitivity of <=0.01 uIU/mL. Performed at [facility], [place] [address], [place], [place] [address]   Heparin level (unfractionated)     Status: Abnormal   Collection Time: 04/22/19  5:46 AM  Result Value Ref Range   Heparin Unfractionated <0.10 (L) 0.30 - 0.70 IU/mL    Comment: (NOTE) If heparin results are below expected values, and patient dosage has  been confirmed, suggest follow up testing of antithrombin III levels. Performed at [facility], [place] [address], [place] [address]   CBC     Status: Abnormal   Collection Time: 04/22/19  5:46 AM  Result Value Ref Range   WBC 6.3 4.0 - 10.5 K/uL   RBC 3.85 (L)  3.87 - 5.11 MIL/uL   Hemoglobin 11.6 (L) 12.0 - 15.0 g/dL   HCT 39.4 36.0 - 46.0 %   MCV 102.3 (H) 80.0 - 100.0 fL   MCH 30.1 26.0 - 34.0 pg   MCHC 29.4 (L) 30.0 - 36.0 g/dL   RDW 15.8 (H) 11.5 - 15.5 %   Platelets 145 (L) 150 - 400 K/uL   nRBC 0.0 0.0 - 0.2 %    Comment: Performed at [facility] [address], [place], [place] [address]  Basic metabolic panel     Status: Abnormal   Collection Time: 04/22/19  5:46 AM  Result Value Ref Range   Sodium 140 135 - 145 mmol/L   Potassium 3.2 (L) 3.5 - 5.1 mmol/L   Chloride 106 98 - 111 mmol/L   CO2 22 22 - 32 mmol/L   Glucose, Bld 162 (H) 70 - 99 mg/dL   BUN 10 8 - 23 mg/dL   Creatinine, Ser 2.75 (H) 0.44 - 1.00 mg/dL   Calcium 8.1 (L) 8.9 - 10.3 mg/dL   GFR calc non Af Amer 16 (L) >60 mL/min   GFR calc Af Amer 19 (L) >60 mL/min   Anion gap 12 5 - 15    Comment: Performed at [facility] [address] [place], [place]  [address]  CBG monitoring, ED     Status: Abnormal   Collection Time: 04/22/19  7:58 AM  Result Value Ref Range   Glucose-Capillary 161 (H) 70 - 99 mg/dL   Comment 1 Notify RN    Comment 2 Document in Chart    Dg Chest 2 View  Result Date: 04/21/2019 CLINICAL DATA:  Chest pain EXAM: CHEST - 2 VIEW COMPARISON:  June 02, 2018 FINDINGS: There is a 6 x 6 mm nodular opacity in the right mid lung region. There is no edema or consolidation. Heart size and pulmonary vascularity are normal. No adenopathy. No bone lesions. IMPRESSION: 6 x 6 mm nodular opacity right mid lung. Advise noncontrast enhanced chest CT to further assess. No edema or consolidation. Cardiac silhouette normal. No adenopathy. Electronically Signed   By: Lowella Grip III M.D.   On: 04/21/2019 15:38   Ct Chest Wo Contrast  Result Date: 04/21/2019 CLINICAL DATA:  Chest pain, shortness of breath. EXAM: CT CHEST WITHOUT CONTRAST TECHNIQUE: Multidetector CT imaging of the chest was performed following the standard protocol without IV contrast. COMPARISON:  Radiograph of same day. CT scan of November 24, 2018. FINDINGS: Cardiovascular: No significant vascular findings. Normal heart size. No pericardial effusion. Mediastinum/Nodes: No enlarged mediastinal or axillary lymph nodes. Thyroid gland, trachea, and esophagus demonstrate no significant findings. Lungs/Pleura: No pneumothorax or pleural effusion is noted. Stable bronchiectasis and probable scarring is noted in both lower lobes. No definite acute abnormality is noted. Upper Abdomen: No acute abnormality. Musculoskeletal: No chest wall mass or suspicious bone lesions identified. IMPRESSION: Stable bronchiectasis and probable scarring is noted in both lower lobes. No acute abnormality seen in the chest. Electronically Signed   By: Marijo Conception M.D.   On: 04/21/2019 19:25   US Renal  Result Date: 04/22/2019 CLINICAL DATA:  Acute kidney injury EXAM: RENAL / URINARY TRACT ULTRASOUND COMPLETE  COMPARISON:  None. FINDINGS: Right Kidney: Renal measurements: 9.8 x 5.2 x 5.3 cm = volume: 139 mL. Upper pole cyst measures up to 6.1 cm. Parapelvic cyst measures 2.8 cm. No hydronephrosis. Normal echotexture. Left Kidney: Renal measurements: 8.5 x 4.5 x 3.8 cm =  volume: 76 mL. 1.4 cm exophytic cyst off the midpole. Normal echotexture. No hydronephrosis. Bladder: Appears normal for degree of bladder distention. Other: None. IMPRESSION: Bilateral renal cysts. No acute findings.  No hydronephrosis. Electronically Signed   By: Rolm Baptise M.D.   On: 04/22/2019 00:43   Vas [place] Lower Extremity Venous (dvt)  Result Date: 04/22/2019  Lower Venous Study Indications: Pulmonary embolism, Swelling, and Pain.  Limitations: Body habitus, poor ultrasound/tissue interface and pain, restricted mobility. Comparison Study: Negative lower extremity venous duplex 06/03/2018 Performing Technologist: Maudry Mayhew MHA, RDMS, RVT, RDCS  Examination Guidelines: A complete evaluation includes B-mode imaging, spectral Doppler, color Doppler, and power Doppler as needed of all accessible portions of each vessel. Bilateral testing is considered an integral part of a complete examination. Limited examinations for reoccurring indications may be performed as noted.  +---------+---------------+---------+-----------+----------+--------------+ RIGHT    CompressibilityPhasicitySpontaneityPropertiesThrombus Aging +---------+---------------+---------+-----------+----------+--------------+ CFV                                                   Not visualized +---------+---------------+---------+-----------+----------+--------------+ SFJ                                                   Not visualized +---------+---------------+---------+-----------+----------+--------------+ FV Prox  Full                                                        +---------+---------------+---------+-----------+----------+--------------+  FV Mid   Full                                                        +---------+---------------+---------+-----------+----------+--------------+ FV DistalFull                                                        +---------+---------------+---------+-----------+----------+--------------+ PFV                                                   Not visualized +---------+---------------+---------+-----------+----------+--------------+ POP      Full           Yes      Yes                                 +---------+---------------+---------+-----------+----------+--------------+ PTV  Not visualized +---------+---------------+---------+-----------+----------+--------------+ PERO                                                  Not visualized +---------+---------------+---------+-----------+----------+--------------+   +---------+---------------+---------+-----------+----------+--------------+ LEFT     CompressibilityPhasicitySpontaneityPropertiesThrombus Aging +---------+---------------+---------+-----------+----------+--------------+ CFV                                                   Not visualized +---------+---------------+---------+-----------+----------+--------------+ SFJ                                                   Not visualized +---------+---------------+---------+-----------+----------+--------------+ FV Prox  Full                                                        +---------+---------------+---------+-----------+----------+--------------+ FV Mid   Full                                                        +---------+---------------+---------+-----------+----------+--------------+ FV Distal                                             Not visualized +---------+---------------+---------+-----------+----------+--------------+ PFV                                                    Not visualized +---------+---------------+---------+-----------+----------+--------------+ POP      Full           Yes      Yes                                 +---------+---------------+---------+-----------+----------+--------------+ PTV                                                   Not visualized +---------+---------------+---------+-----------+----------+--------------+ PERO                                                  Not visualized +---------+---------------+---------+-----------+----------+--------------+     Summary: Right: There is no evidence of deep vein thrombosis in the lower extremity. However, portions of this examination were limited- see technologist comments above. No cystic structure found in the popliteal  fossa. Left: There is no evidence of deep vein thrombosis in the lower extremity. However, portions of this examination were limited- see technologist comments above. No cystic structure found in the popliteal fossa.  *See table(s) above for measurements and observations.    Preliminary     Pending Labs Unresulted Labs (From admission, onward)    Start     Ordered   04/23/19 0500  Heparin level (unfractionated)  Daily,   R     04/21/19 2119   04/23/19 0500  Magnesium  Tomorrow morning,   R     04/22/19 0843   04/22/19 1400  Heparin level  Once-Timed,   STAT     04/22/19 0709   04/22/19 0745  Urinalysis, Routine w reflex microscopic  Once,   STAT     04/22/19 0745   04/22/19 0600  CBC  Daily,   R     04/21/19 2119          Vitals/Pain Today's Vitals   04/22/19 0630 04/22/19 0700 04/22/19 0739 04/22/19 1051  BP: (!) 90/54 (!) 88/61    Pulse: (!) 118 (!) 114    Resp: 17 15    Temp:      TempSrc:      SpO2: 96% 96%    Weight:      Height:      PainSc:   0-No pain 5     Isolation Precautions Airborne and Contact precautions  Medications Medications  heparin ADULT infusion 100 units/mL (25000 units/244m sodium chloride  0.45%) (1,300 Units/hr Intravenous Rate/Dose Change 04/22/19 0712)  sodium chloride (OCEAN) 0.65 % nasal spray 1 spray (has no administration in time range)  guaiFENesin (MUCINEX) 12 hr tablet 1,200 mg (1,200 mg Oral Given 04/22/19 1058)  fluticasone (FLONASE) 50 MCG/ACT nasal spray 2 spray (has no administration in time range)  multivitamin with minerals tablet 1 tablet (1 tablet Oral Given 04/22/19 1058)  cholecalciferol (VITAMIN D3) tablet 2,000 Units (has no administration in time range)  gabapentin (NEURONTIN) capsule 1,200 mg (1,200 mg Oral Given 04/22/19 1057)  pantoprazole (PROTONIX) EC tablet 40 mg (40 mg Oral Given 04/22/19 1103)  levothyroxine (SYNTHROID) tablet 50 mcg (50 mcg Oral Given 04/22/19 0619)  DULoxetine (CYMBALTA) DR capsule 60 mg (60 mg Oral Given 04/22/19 1057)  atorvastatin (LIPITOR) tablet 10 mg (10 mg Oral Given 04/22/19 0035)  hydroxychloroquine (PLAQUENIL) tablet 400 mg (has no administration in time range)  leflunomide (ARAVA) tablet 10 mg (has no administration in time range)  allopurinol (ZYLOPRIM) tablet 300 mg (300 mg Oral Given 04/22/19 1056)  acetaminophen (TYLENOL) tablet 1,000 mg (has no administration in time range)  insulin aspart (novoLOG) injection 0-9 Units (2 Units Subcutaneous Given 04/22/19 0916)  insulin aspart (novoLOG) injection 0-5 Units (0 Units Subcutaneous Not Given 04/21/19 2350)  ipratropium-albuterol (DUONEB) 0.5-2.5 (3) MG/3ML nebulizer solution 3 mL (has no administration in time range)  0.9 %  sodium chloride infusion ( Intravenous Rate/Dose Verify 04/22/19 1055)  sodium chloride 0.9 % bolus 500 mL (0 mLs Intravenous Stopped 04/21/19 2350)  heparin bolus via infusion 5,000 Units (5,000 Units Intravenous Bolus from Bag 04/21/19 2156)  potassium chloride SA (KLOR-CON) CR tablet 40 mEq (40 mEq Oral Given 04/22/19 0035)  magnesium sulfate IVPB 2 g 50 mL (0 g Intravenous Stopped 04/22/19 0730)  heparin bolus via infusion 2,000 Units (2,000 Units  Intravenous Bolus from Bag 04/22/19 0712)  potassium chloride SA (KLOR-CON) CR tablet 20 mEq (20 mEq Oral Given 04/22/19 0917)  Mobility non-ambulatory Moderate fall risk    Cardiac Rhythm: Sinus tachycardia Lab Results  Component Value Date   CKTOTAL 57 07/12/2012   CKMB 1.2 01/28/2012   TROPONINI <0.03 06/02/2018   Lab Results  Component Value Date   DDIMER 0.39 02/10/2019   Does the Patient currently have chest pain? No     R Recommendations: See Admitting Provider Note  Report given to:   Additional Notes:

## 2019-04-22 NOTE — ED Notes (Signed)
Patient receiving 500cc Bolus, per MD order at bedside

## 2019-04-22 NOTE — Progress Notes (Addendum)
PROGRESS NOTE    [name]  XHF:[id number] DOB: Sep 25, 1940 DOA: 04/21/2019 PCP: [name], MD   Brief Narrative:  [name] is a 78 y.o. female with medical history significant of chronic diastolic congestive heart failure, CKD, asthma, COPD, interstitial lung disease, chronic respiratory failure on 2 L home oxygen at night, depression, type 2 diabetes, hyperlipidemia, hypertension, hypothyroidism, morbid obesity (BMI 44.96), gout, clotting disorder, history of DVT previously on Eliquis, rheumatoid arthritis presenting with complaints of chest pain and shortness of breath.    She has been hypotensive since her presentation.  Bilateral lower extremity redness.  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 poa Patient has persistent tachycardia and hypotension.  No leukocytosis or fever though. Source is concerning for bilateral lower extremity cellulitis.  Blood cultures.  Ordered lactic acid.  Continue IV fluids.  Normal saline bolus as needed for blood pressure support.  Start empiric antibiotic.  Chest x-ray with no consolidation.  Check urinalysis.    Chest pain/dyspnea - concern for acute PE Patient does have a history of prior DVT for which she was on Eliquis which was stopped a month ago.   -Follow-up on VQ scan.  Follow-up on bilateral lower extremity Doppler. -Continue heparin drip. -Continue cardiac monitoring -Unable to do CT angiogram due to worsening renal insufficiency.  VQ scan has been ordered. -Continuous pulse ox, supplemental oxygen as needed   Hypotension Signing for sepsis.  -Hold home blood pressure medications -IV fluid resuscitation to keep MAP >65 -Started on antibiotics per above.  Hypokalemia Potassium low again at 3.2.  Will replete as needed and repeat level in the morning.  Magnesium level  also low.  Replete as needed -Hold Lasix  Non-insulin-dependent type 2 diabetes Continue sliding scale insulin sensitive and CBG checks.  AKI versus progressively worsening CKD BUN, creatinine 2.1.  Baseline creatinine 1.0-1.1 about a year ago.  No recent labs for comparison.  Patient states her nephrologist told her her renal function has been worsening.  ?Prerenal due to hypotension.  Takes Lasix and olmesartan at home. renal ultrasound unremarkable. -Continue IV fluids.  Continue monitoring urine output.  Avoid nephrotoxic medications.  Chronic diastolic congestive heart failure BNP normal.  CT without evidence of pulmonary edema. -Hold diuretic and ARB due to hypotension  Hypertension -Hold antihypertensives at this time given current hypotension  Interstitial lung disease -Stable.    Continue Combivent as needed.  Gout -Continue allopurinol  Hyperlipidemia -Continue Lipitor  Chronic pain/neuropathy -Continue gabapentin  Rheumatoid arthritis -Continue Plaquenil and leflunomide  Hypothyroidism -Continue Synthroid -Check TSH level  GERD -Continue PPI   DVT prophylaxis: Heparin drip Code Status: Full code Family Communication: None at bedside Disposition Plan: Continue inpatient care, anticipated 2 midnight stay.  The risks of not treating his inpatient include progression to severe sepsis and septic shock. Consultants:   None  Procedures: None  Antimicrobials: Blood cultures ordered on 12/4  Subjective: Continues to feel weak and tired.  Blood pressure remains soft.  Afebrile overnight.  Heart rate improving.  Denies chest pain but has shortness of breath.  Objective: Vitals:   04/22/19 0600 04/22/19 0630 04/22/19 0700 04/22/19 1141  BP: 100/63 (!) 90/54 (!) 88/61 (!) 90/52  Pulse: (!) 121 (!) 118 (!) 114 70  Resp: _0 Temp:  TempSrc:      SpO2: 91% 96% 96% 98%  Weight:      Height:        Intake/Output Summary (Last 24  hours) at 04/22/2019 1201 Last data filed at 04/22/2019 1055 Gross per 24 hour  Intake 46.26 ml  Output -  Net 46.26 ml   Filed Weights   04/21/19 1428  Weight: 118.8 kg    Examination:  General exam: Appears calm and comfortable  Respiratory system: No respiratory distress, no wheezing.  Cardiovascular system: S1 & S2. Distal pulses present.  Gastrointestinal system: Soft, NT, ND Central nervous system: Alert and orientedx3. No focal neurological deficits. Extremities: Symmetric 5 x 5 power. Skin: erythema of bilateral lower extremities with increase in warmth, no papules or pustules, no skin tear. There is a ~10cm in diameter macular, dark area on the lateral lower left leg (patient reports having this area bruised months ago when her leg got caught on the car door).  Psychiatry:  Mood & affect appropriate.     Data Reviewed: I have personally reviewed following labs and imaging studies  CBC: Recent Labs  Lab 04/21/19 1539 04/22/19 0546  WBC 6.4 6.3  HGB 12.8 11.6*  HCT 43.5 39.4  MCV 99.3 102.3*  PLT 154 630*   Basic Metabolic Panel: Recent Labs  Lab 04/21/19 1539 04/22/19 0213 04/22/19 0546  NA 140  --  140  K 3.0*  --  3.2*  CL 102  --  106  CO2 25  --  22  GLUCOSE 231*  --  162*  BUN 10  --  10  CREATININE 2.18*  --  2.75*  CALCIUM 8.4*  --  8.1*  MG  --  1.5*  --    GFR: Estimated Creatinine Clearance: 21.7 mL/min (A) (by C-G formula based on SCr of 2.75 mg/dL (H)). Liver Function Tests: Recent Labs  Lab 04/21/19 1539  AST 33  ALT 36  ALKPHOS 94  BILITOT 0.6  PROT 5.5*  ALBUMIN 3.0*   No results for input(s): LIPASE, AMYLASE in the last 168 hours. No results for input(s): AMMONIA in the last 168 hours. Coagulation Profile: No results for input(s): INR, PROTIME in the last 168 hours. Cardiac Enzymes: No results for input(s): CKTOTAL, CKMB, CKMBINDEX, TROPONINI in the last 168 hours. BNP (last 3 results) No results for input(s): PROBNP in  the last 8760 hours. HbA1C: Recent Labs    04/22/19 0213  HGBA1C 7.4*   CBG: Recent Labs  Lab 04/21/19 2334 04/22/19 0758  GLUCAP 153* 161*   Lipid Profile: No results for input(s): CHOL, HDL, LDLCALC, TRIG, CHOLHDL, LDLDIRECT in the last 72 hours. Thyroid Function Tests: Recent Labs    04/22/19 0213  TSH 2.256   Anemia Panel: No results for input(s): VITAMINB12, FOLATE, FERRITIN, TIBC, IRON, RETICCTPCT in the last 72 hours. Sepsis Labs: No results for input(s): PROCALCITON, LATICACIDVEN in the last 168 hours.  Recent Results (from the past 240 hour(s))  SARS CORONAVIRUS 2 (TAT 6-24 HRS) Nasopharyngeal Nasopharyngeal Swab     Status: None   Collection Time: 04/21/19 10:10 PM   Specimen: Nasopharyngeal Swab  Result Value Ref Range Status   SARS Coronavirus 2 NEGATIVE NEGATIVE Final    Comment: (NOTE) SARS-CoV-2 target nucleic acids are NOT DETECTED. The SARS-CoV-2 RNA is generally detectable in upper and lower respiratory specimens during the acute phase of infection. Negative results do not preclude SARS-CoV-2 infection, do not rule out co-infections with other pathogens, and should not be  used as the sole basis for treatment or other patient management decisions. Negative results must be combined with clinical observations, patient history, and epidemiological information. The expected result is Negative. Fact Sheet for Patients: SugarRoll.be Fact Sheet for Healthcare Providers: https://www.woods-mathews.com/ This test is not yet approved or cleared by the [place] FDA and  has been authorized for detection and/or diagnosis of SARS-CoV-2 by FDA under an Emergency Use Authorization (EUA). This EUA will remain  in effect (meaning this test can be used) for the duration of the COVID-19 declaration under Section 56 4(b)(1) of the Act, 21 U.S.C. section 360bbb-3(b)(1), unless the authorization is terminated or revoked  sooner. Performed at [facility], [place] [address], [place], [place] [address]          Radiology Studies: Dg Chest 2 View  Result Date: 04/21/2019 CLINICAL DATA:  Chest pain EXAM: CHEST - 2 VIEW COMPARISON:  June 02, 2018 FINDINGS: There is a 6 x 6 mm nodular opacity in the right mid lung region. There is no edema or consolidation. Heart size and pulmonary vascularity are normal. No adenopathy. No bone lesions. IMPRESSION: 6 x 6 mm nodular opacity right mid lung. Advise noncontrast enhanced chest CT to further assess. No edema or consolidation. Cardiac silhouette normal. No adenopathy. Electronically Signed   By: Lowella Grip III M.D.   On: 04/21/2019 15:38   Ct Chest Wo Contrast  Result Date: 04/21/2019 CLINICAL DATA:  Chest pain, shortness of breath. EXAM: CT CHEST WITHOUT CONTRAST TECHNIQUE: Multidetector CT imaging of the chest was performed following the standard protocol without IV contrast. COMPARISON:  Radiograph of same day. CT scan of November 24, 2018. FINDINGS: Cardiovascular: No significant vascular findings. Normal heart size. No pericardial effusion. Mediastinum/Nodes: No enlarged mediastinal or axillary lymph nodes. Thyroid gland, trachea, and esophagus demonstrate no significant findings. Lungs/Pleura: No pneumothorax or pleural effusion is noted. Stable bronchiectasis and probable scarring is noted in both lower lobes. No definite acute abnormality is noted. Upper Abdomen: No acute abnormality. Musculoskeletal: No chest wall mass or suspicious bone lesions identified. IMPRESSION: Stable bronchiectasis and probable scarring is noted in both lower lobes. No acute abnormality seen in the chest. Electronically Signed   By: Marijo Conception M.D.   On: 04/21/2019 19:25   US Renal  Result Date: 04/22/2019 CLINICAL DATA:  Acute kidney injury EXAM: RENAL / URINARY TRACT ULTRASOUND COMPLETE COMPARISON:  None. FINDINGS: Right Kidney: Renal measurements: 9.8 x 5.2 x 5.3 cm =  volume: 139 mL. Upper pole cyst measures up to 6.1 cm. Parapelvic cyst measures 2.8 cm. No hydronephrosis. Normal echotexture. Left Kidney: Renal measurements: 8.5 x 4.5 x 3.8 cm = volume: 76 mL. 1.4 cm exophytic cyst off the midpole. Normal echotexture. No hydronephrosis. Bladder: Appears normal for degree of bladder distention. Other: None. IMPRESSION: Bilateral renal cysts. No acute findings.  No hydronephrosis. Electronically Signed   By: Rolm Baptise M.D.   On: 04/22/2019 00:43   Vas [place] Lower Extremity Venous (dvt)  Result Date: 04/22/2019  Lower Venous Study Indications: Pulmonary embolism, Swelling, and Pain.  Limitations: Body habitus, poor ultrasound/tissue interface and pain, restricted mobility. Comparison Study: Negative lower extremity venous duplex 06/03/2018 Performing Technologist: Maudry Mayhew MHA, RDMS, RVT, RDCS  Examination Guidelines: A complete evaluation includes B-mode imaging, spectral Doppler, color Doppler, and power Doppler as needed of all accessible portions of each vessel. Bilateral testing is considered an integral part of a complete examination. Limited examinations for reoccurring indications may be performed as  noted.  +---------+---------------+---------+-----------+----------+--------------+ RIGHT    CompressibilityPhasicitySpontaneityPropertiesThrombus Aging +---------+---------------+---------+-----------+----------+--------------+ CFV                                                   Not visualized +---------+---------------+---------+-----------+----------+--------------+ SFJ                                                   Not visualized +---------+---------------+---------+-----------+----------+--------------+ FV Prox  Full                                                        +---------+---------------+---------+-----------+----------+--------------+ FV Mid   Full                                                         +---------+---------------+---------+-----------+----------+--------------+ FV DistalFull                                                        +---------+---------------+---------+-----------+----------+--------------+ PFV                                                   Not visualized +---------+---------------+---------+-----------+----------+--------------+ POP      Full           Yes      Yes                                 +---------+---------------+---------+-----------+----------+--------------+ PTV                                                   Not visualized +---------+---------------+---------+-----------+----------+--------------+ PERO                                                  Not visualized +---------+---------------+---------+-----------+----------+--------------+   +---------+---------------+---------+-----------+----------+--------------+ LEFT     CompressibilityPhasicitySpontaneityPropertiesThrombus Aging +---------+---------------+---------+-----------+----------+--------------+ CFV                                                   Not visualized +---------+---------------+---------+-----------+----------+--------------+ SFJ  Not visualized +---------+---------------+---------+-----------+----------+--------------+ FV Prox  Full                                                        +---------+---------------+---------+-----------+----------+--------------+ FV Mid   Full                                                        +---------+---------------+---------+-----------+----------+--------------+ FV Distal                                             Not visualized +---------+---------------+---------+-----------+----------+--------------+ PFV                                                   Not visualized  +---------+---------------+---------+-----------+----------+--------------+ POP      Full           Yes      Yes                                 +---------+---------------+---------+-----------+----------+--------------+ PTV                                                   Not visualized +---------+---------------+---------+-----------+----------+--------------+ PERO                                                  Not visualized +---------+---------------+---------+-----------+----------+--------------+     Summary: Right: There is no evidence of deep vein thrombosis in the lower extremity. However, portions of this examination were limited- see technologist comments above. No cystic structure found in the popliteal fossa. Left: There is no evidence of deep vein thrombosis in the lower extremity. However, portions of this examination were limited- see technologist comments above. No cystic structure found in the popliteal fossa.  *See table(s) above for measurements and observations.    Preliminary         Scheduled Meds: . allopurinol  300 mg Oral Daily  . atorvastatin  10 mg Oral QPM  . cholecalciferol  2,000 Units Oral Q lunch  . DULoxetine  60 mg Oral Daily  . fluticasone  2 spray Each Nare Daily  . gabapentin  1,200 mg Oral TID  . guaiFENesin  1,200 mg Oral BID  . hydroxychloroquine  400 mg Oral Daily  . insulin aspart  0-5 Units Subcutaneous QHS  . insulin aspart  0-9 Units Subcutaneous TID WC  . leflunomide  10 mg Oral Daily  . levothyroxine  50 mcg Oral QAC breakfast  . multivitamin with minerals  1 tablet Oral Daily  . pantoprazole  40 mg  Oral Daily   Continuous Infusions: . sodium chloride 100 mL/hr at 04/22/19 1055  . heparin 1,300 Units/hr (04/22/19 0712)  . meropenem (MERREM) IV    . vancomycin       LOS: 0 days    Time spent: 35 minutes    Blain Pais, MD [facility]   If 7PM-7AM, please contact night-coverage [email or web address]  Password [email or web address] 04/22/2019, 12:01 PM

## 2019-04-22 NOTE — Progress Notes (Signed)
Provide aware bp 90/54 HR 114.

## 2019-04-22 NOTE — Progress Notes (Signed)
ANTICOAGULATION CONSULT NOTE - Initial Consult  Pharmacy Consult for heparin Indication: r/o pulmonary embolus  Allergies  Allergen Reactions  . Codeine Swelling and Other (See Comments)    Facial swelling Chest pain and swelling in legs  Facial swelling  . Infliximab Anaphylaxis    "sent me into shock" "sent me into shock"  . Lisinopril Swelling    Face and neck swelling Face and neck swelling    Patient Measurements: Height: _0  (162.6 cm) Weight: 261 lb 14.5 oz (118.8 kg) IBW/kg (Calculated) : 54.7 Heparin Dosing Weight: 83.5kg  Vital Signs: BP: 90/54 (12/04 0630) Pulse Rate: 118 (12/04 0630)  Labs: Recent Labs    04/21/19 1539 04/21/19 1702 04/22/19 0546  HGB 12.8  --  11.6*  HCT 43.5  --  39.4  PLT 154  --  145*  HEPARINUNFRC  --   --  <0.10*  CREATININE 2.18*  --  2.75*  TROPONINIHS 11 9  --     Estimated Creatinine Clearance: 21.7 mL/min (A) (by C-G formula based on SCr of 2.75 mg/dL (H)).   Assessment: 78 y.o. female admitted with chest pain and possible PE for heparin   Goal of Therapy:  Heparin level 0.3-0.7 units/ml Monitor platelets by anticoagulation protocol: Yes   Plan:  Heparin 2000 units IV bolus, then increase heparin  1300 units/hr Check heparin level in 6 hours.   [name] 04/22/2019,7:04 AM

## 2019-04-22 NOTE — Progress Notes (Signed)
ANTICOAGULATION CONSULT NOTE - Initial Consult  Pharmacy Consult for heparin Indication: r/o pulmonary embolus  Allergies  Allergen Reactions  . Codeine Swelling and Other (See Comments)    Facial swelling Chest pain and swelling in legs  Facial swelling  . Infliximab Anaphylaxis    "sent me into shock" "sent me into shock"  . Lisinopril Swelling    Face and neck swelling Face and neck swelling    Patient Measurements: Height: 5' 4" (162.6 cm) Weight: 264 lb 15.9 oz (120.2 kg) IBW/kg (Calculated) : 54.7 Heparin Dosing Weight: 83.5kg  Vital Signs: Temp: 98.6 F (37 C) (12/04 1435) Temp Source: Oral (12/04 1435) BP: 109/62 (12/04 1435) Pulse Rate: 116 (12/04 1435)  Labs: Recent Labs    04/21/19 1539 04/21/19 1702 04/22/19 0546 04/22/19 1439  HGB 12.8  --  11.6*  --   HCT 43.5  --  39.4  --   PLT 154  --  145*  --   HEPARINUNFRC  --   --  <0.10* <0.10*  CREATININE 2.18*  --  2.75*  --   TROPONINIHS 11 9  --   --     Estimated Creatinine Clearance: 21.9 mL/min (A) (by C-G formula based on SCr of 2.75 mg/dL (H)).   Assessment: 78 y.o. female admitted with chest pain and possible PE for heparin  Heparin level undetectable   Goal of Therapy:  Heparin level 0.3-0.7 units/ml Monitor platelets by anticoagulation protocol: Yes   Plan:  Heparin 2000 units IV bolus, then increase heparin  1550 units/hr Check heparin level in 6-8 hours.  Daily heparin level and CBC  Alanda Slim, PharmD, [facility] Clinical Pharmacist Please see AMION for all Pharmacists' Contact Phone Numbers 04/22/2019, 4:52 PM

## 2019-04-22 NOTE — ED Notes (Signed)
Lunch Tray Ordered @ 1020.

## 2019-04-22 NOTE — ED Notes (Signed)
Ordered breakfast--[name] 

## 2019-04-22 NOTE — ED Notes (Signed)
Pt transported for VQ scan.

## 2019-04-22 NOTE — Progress Notes (Signed)
Pharmacy Antibiotic Note  [name] is a 78 y.o. female admitted on 04/21/2019 with cellulitis.  Pharmacy has been consulted for vancomycin and cefepime dosing.  Presenting with bilateral lower extremity redness- WBC 6.3, afebrile. Hypotensive. Scr 2.75 (CrCl 21 mL/min) - last Scr on profile 1.22 in January 2020.   Plan: Vancomycin 2g IV once then 1g IV every 48 hours (estAUC 499) Cefepime 2 g IV every 24 hours Monitor renal fx, cx results, clinical pic, and vanc levels as appropriate   Height: _0  (162.6 cm) Weight: 264 lb 15.9 oz (120.2 kg) IBW/kg (Calculated) : 54.7  Temp (24hrs), Avg:98.7 F (37.1 C), Min:98.5 F (36.9 C), Max:98.9 F (37.2 C)  Recent Labs  Lab 04/21/19 1539 04/22/19 0546  WBC 6.4 6.3  CREATININE 2.18* 2.75*    Estimated Creatinine Clearance: 21.9 mL/min (A) (by C-G formula based on SCr of 2.75 mg/dL (H)).    Allergies  Allergen Reactions  . Codeine Swelling and Other (See Comments)    Facial swelling Chest pain and swelling in legs  Facial swelling  . Infliximab Anaphylaxis    "sent me into shock" "sent me into shock"  . Lisinopril Swelling    Face and neck swelling Face and neck swelling    Antimicrobials this admission: Vancomycin  12/4 >>  Cefepime 12/4 >>   Dose adjustments this admission: N/A  Microbiology results: 12/4 BCx: sent  Thank you for allowing pharmacy to be a part of this patient's care.  Antonietta Jewel, PharmD, B[facility] Clinical Pharmacist  Phone: [phone]  Please check AMION for all [place] phone numbers After 10:00 PM, call [place] [phone] 04/22/2019 12:42 PM

## 2019-04-22 NOTE — Progress Notes (Signed)
Patient given 1L NS bolus, blood cultures x2 obtained, UA w reflex and urine culture obtained with 1x I/O cath done per order for unable to void.  Bladder scan prior to I/O cath 191 ml, 150 ml urine obtained by I/O cath, post cath bladder scan 0 ml.  Continue to monitor B/p and urine output.

## 2019-04-22 NOTE — Progress Notes (Signed)
Bilateral lower extremity venous duplex completed. Refer to "CV Proc" under chart review to view preliminary results.  04/22/2019 9:51 AM Kelby Aline., MHA, RVT, RDCS, RDMS

## 2019-04-22 NOTE — ED Notes (Signed)
Pt returned to room from VQ scan.

## 2019-04-23 DIAGNOSIS — L03119 Cellulitis of unspecified part of limb: Secondary | ICD-10-CM

## 2019-04-23 DIAGNOSIS — E861 Hypovolemia: Secondary | ICD-10-CM

## 2019-04-23 DIAGNOSIS — R071 Chest pain on breathing: Secondary | ICD-10-CM

## 2019-04-23 DIAGNOSIS — R6520 Severe sepsis without septic shock: Secondary | ICD-10-CM

## 2019-04-23 DIAGNOSIS — A419 Sepsis, unspecified organism: Principal | ICD-10-CM

## 2019-04-23 DIAGNOSIS — N390 Urinary tract infection, site not specified: Secondary | ICD-10-CM

## 2019-04-23 DIAGNOSIS — I9589 Other hypotension: Secondary | ICD-10-CM

## 2019-04-23 DIAGNOSIS — E1140 Type 2 diabetes mellitus with diabetic neuropathy, unspecified: Secondary | ICD-10-CM

## 2019-04-23 LAB — HEPARIN LEVEL (UNFRACTIONATED)
Heparin Unfractionated: 0.29 IU/mL — ABNORMAL LOW (ref 0.30–0.70)
Heparin Unfractionated: 0.3 IU/mL (ref 0.30–0.70)

## 2019-04-23 LAB — GLUCOSE, CAPILLARY
Glucose-Capillary: 202 mg/dL — ABNORMAL HIGH (ref 70–99)
Glucose-Capillary: 173 mg/dL — ABNORMAL HIGH (ref 70–99)
Glucose-Capillary: 187 mg/dL — ABNORMAL HIGH (ref 70–99)
Glucose-Capillary: 171 mg/dL — ABNORMAL HIGH (ref 70–99)
Glucose-Capillary: 176 mg/dL — ABNORMAL HIGH (ref 70–99)

## 2019-04-23 LAB — CBC
HCT: 36.8 % (ref 36.0–46.0)
Hemoglobin: 11.4 g/dL — ABNORMAL LOW (ref 12.0–15.0)
MCH: 29.8 pg (ref 26.0–34.0)
MCHC: 31 g/dL (ref 30.0–36.0)
MCV: 96.3 fL (ref 80.0–100.0)
Platelets: 139 10*3/uL — ABNORMAL LOW (ref 150–400)
RBC: 3.82 MIL/uL — ABNORMAL LOW (ref 3.87–5.11)
RDW: 15.8 % — ABNORMAL HIGH (ref 11.5–15.5)
WBC: 5.5 10*3/uL (ref 4.0–10.5)
nRBC: 0 % (ref 0.0–0.2)

## 2019-04-23 LAB — MAGNESIUM: Magnesium: 1.7 mg/dL (ref 1.7–2.4)

## 2019-04-23 LAB — MRSA PCR SCREENING: MRSA by PCR: NEGATIVE

## 2019-04-23 MED ORDER — SODIUM CHLORIDE 0.9 % IV SOLN
Freq: Once | INTRAVENOUS | Status: AC
  Administered 2019-04-23: 10:00:00 via INTRAVENOUS

## 2019-04-23 MED ORDER — MORPHINE SULFATE (PF) 2 MG/ML IV SOLN
0.5000 mg | INTRAVENOUS | Status: DC | PRN
  Administered 2019-04-23 – 2019-04-24 (×2): 0.5 mg via INTRAVENOUS
  Filled 2019-04-23 (×2): qty 1

## 2019-04-23 MED ORDER — HEPARIN SODIUM (PORCINE) 5000 UNIT/ML IJ SOLN
5000.0000 [IU] | Freq: Three times a day (TID) | INTRAMUSCULAR | Status: DC
  Administered 2019-04-23 – 2019-04-28 (×15): 5000 [IU] via SUBCUTANEOUS
  Filled 2019-04-23 (×15): qty 1

## 2019-04-23 MED ORDER — METOPROLOL TARTRATE 12.5 MG HALF TABLET
12.5000 mg | ORAL_TABLET | Freq: Two times a day (BID) | ORAL | Status: DC
  Administered 2019-04-23 – 2019-04-26 (×7): 12.5 mg via ORAL
  Filled 2019-04-23 (×7): qty 1

## 2019-04-23 NOTE — Progress Notes (Signed)
[facility] for Heparin Indication: r/o pulmonary embolus  Allergies  Allergen Reactions  . Codeine Swelling and Other (See Comments)    Facial swelling Chest pain and swelling in legs  Facial swelling  . Infliximab Anaphylaxis    "sent me into shock" "sent me into shock"  . Lisinopril Swelling    Face and neck swelling Face and neck swelling    Patient Measurements: Height: _0  (162.6 cm) Weight: 264 lb 15.9 oz (120.2 kg) IBW/kg (Calculated) : 54.7 Heparin Dosing Weight: 83.5kg  Vital Signs: Temp: 99.2 F (37.3 C) (12/05 0029) Temp Source: Oral (12/05 0029) BP: 102/50 (12/05 0029) Pulse Rate: 126 (12/05 0029)  Labs: Recent Labs    04/21/19 1539 04/21/19 1702 04/22/19 0546 04/22/19 1439 04/23/19 0018  HGB 12.8  --  11.6*  --   --   HCT 43.5  --  39.4  --   --   PLT 154  --  145*  --   --   HEPARINUNFRC  --   --  <0.10* <0.10* 0.29*  CREATININE 2.18*  --  2.75*  --   --   TROPONINIHS 11 9  --   --   --     Estimated Creatinine Clearance: 21.9 mL/min (A) (by C-G formula based on SCr of 2.75 mg/dL (H)).   Assessment: 78 y.o. female admitted with chest pain and possible PE for heparin  12/5 AM update:  Heparin level just below goal  No issues per RN  Goal of Therapy:  Heparin level 0.3-0.7 units/ml Monitor platelets by anticoagulation protocol: Yes   Plan:  Inc heparin to 1700 units/hr Re-check heparin level in 8 hours  Narda Bonds, PharmD, [place] Pharmacist Phone: [phone]

## 2019-04-23 NOTE — Progress Notes (Signed)
 for Heparin > stopped Indication: r/o pulmonary embolus  Allergies  Allergen Reactions  . Codeine Swelling and Other (See Comments)    Facial swelling Chest pain and swelling in legs  Facial swelling  . Infliximab Anaphylaxis    "sent me into shock" "sent me into shock"  . Lisinopril Swelling    Face and neck swelling Face and neck swelling    Patient Measurements: Height: _0  (162.6 cm) Weight: 259 lb 1.6 oz (117.5 kg)(scalea) IBW/kg (Calculated) : 54.7 Heparin Dosing Weight: 83.5kg  Vital Signs: Temp: 99.8 F (37.7 C) (12/05 0749) Temp Source: Oral (12/05 0749) BP: 114/63 (12/05 1037) Pulse Rate: 106 (12/05 1037)  Labs: Recent Labs    04/21/19 1539 04/21/19 1702  04/22/19 0546 04/22/19 1439 04/23/19 0018 04/23/19 0647 04/23/19 0916  HGB 12.8  --   --  11.6*  --   --  11.4*  --   HCT 43.5  --   --  39.4  --   --  36.8  --   PLT 154  --   --  145*  --   --  139*  --   HEPARINUNFRC  --   --    < > <0.10* <0.10* 0.29*  --  0.30  CREATININE 2.18*  --   --  2.75*  --   --   --   --   TROPONINIHS 11 9  --   --   --   --   --   --    < > = values in this interval not displayed.    Estimated Creatinine Clearance: 21.6 mL/min (A) (by C-G formula based on SCr of 2.75 mg/dL (H)).   Assessment: 78 y.o. female admitted with chest pain and possible PE for heparin. Heparin stopped this AM after VQ and dopplers negative.  Heparin level was within therapeutic range prior to stopping.  CBC stable, no overt bleeding or complications noted.  Goal of Therapy:  Heparin level 0.3-0.7 units/ml Monitor platelets by anticoagulation protocol: Yes   Plan:  D/c heparin. Consider adding lovenox or SQ heparin for DVT prophylaxis.  [name] Olea, [facility] Clinical Pharmacist Phone [phone]  04/23/2019 10:57 AM

## 2019-04-23 NOTE — Progress Notes (Signed)
PROGRESS NOTE    [name]  HEN:[id number] DOB: February 18, 1941 DOA: 04/21/2019 PCP: [name], MD   Brief Narrative:  [name] is a 78 y.o. female with medical history significant of chronic diastolic congestive heart failure, CKD, asthma, COPD, interstitial lung disease, chronic respiratory failure on 2 L home oxygen at night, depression, type 2 diabetes, hyperlipidemia, hypertension, hypothyroidism, morbid obesity (BMI 44.96), gout, clotting disorder, history of DVT previously on Eliquis, rheumatoid arthritis presenting with complaints of chest pain and shortness of breath.    She has been hypotensive since her presentation.  Bilateral lower extremity redness.  Assessment & Plan:   Principal Problem:   Chest pain Active Problems:   Thyroid disease   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)   Hypotension   Hypokalemia   Sepsis. poa Patient has persistent tachycardia and hypotension.  No leukocytosis or fever though. Source is concerning for bilateral lower extremity cellulitis and UTI.   -Follow up on urine and blood cultures.   Continue IV fluids.  Normal saline bolus as needed for blood pressure support.  Continue  empiric antibiotic.  Chest x-ray with no consolidation.      Chest pain/dyspnea - Resolved.  Patient does have a history of prior DVT for which she was on Eliquis which was stopped a month ago.   -VQ scan and bilateral lower extremity Doppler negative -Discontinued heparin drip. -Continuous pulse ox, supplemental oxygen as needed   Hypotension Due to sepsis.  -Hold home blood pressure medications -IV fluid resuscitation to keep MAP >65 -Started on antibiotics per above.  Hypokalemia Will replete as needed and repeat level in the morning.  Magnesium level also low.  Replete as needed -Hold Lasix  Non-insulin-dependent type 2 diabetes Continue sliding scale insulin sensitive and CBG checks.  AKI versus progressively  worsening CKD BUN, creatinine 2.1.  Baseline creatinine 1.0-1.1 about a year ago.  No recent labs for comparison.  Patient states her nephrologist told her her renal function has been worsening.  ?Prerenal due to hypotension.  Takes Lasix and olmesartan at home. renal ultrasound unremarkable. -Continue IV fluids.  Continue monitoring urine output.  Avoid nephrotoxic medications.  Chronic diastolic congestive heart failure BNP normal.  CT without evidence of pulmonary edema. -Hold diuretic and ARB due to hypotension  Hypertension -Hold antihypertensives at this time given current hypotension  Interstitial lung disease -Stable.    Continue Combivent as needed.  Gout -Continue allopurinol  Hyperlipidemia -Continue Lipitor  Chronic pain/neuropathy -Continue gabapentin  Rheumatoid arthritis -Continue Plaquenil and leflunomide  Hypothyroidism -Continue Synthroid -Check TSH level  GERD -Continue PPI   DVT prophylaxis: Heparin drip Code Status: Full code Family Communication: Daughter at the bedside Disposition Plan: Continue inpatient care, anticipated 2 midnight stay.  The risks of not treating his inpatient include progression to severe sepsis and septic shock. Consultants:   None  Procedures: None  Antimicrobials: Blood cultures ordered on 12/4  Subjective: She feels slightly better. Her BP is improving. BL LE edema is improving.   Objective: Vitals:   04/23/19 1207 04/23/19 1223 04/23/19 1238 04/23/19 1438  BP: 122/64   125/70  Pulse: (!) 109  (!) 110 (!) 109  Resp: (!) _0 Temp:  98 F (36.7 C)  98.2 F (36.8 C)  TempSrc:  Oral  Oral  SpO2: 95%  98% 100%  Weight:      Height:        Intake/Output Summary (Last 24 hours) at  04/23/2019 1538 Last data filed at 04/23/2019 1300 Gross per 24 hour  Intake 3734.97 ml  Output 1202 ml  Net 2532.97 ml   Filed Weights   04/21/19 1428 04/22/19 1141 04/23/19 0500  Weight: 118.8 kg 120.2 kg  117.5 kg    Examination:  General exam: Appears calm and comfortable  Respiratory system: No respiratory distress, no wheezing.  Cardiovascular system: S1 & S2. Distal pulses present. Mild tachycardia.  Gastrointestinal system: Soft, NT, ND Central nervous system: Alert and orientedx3. No focal neurological deficits. Extremities: Symmetric 5 x 5 power. Skin: improving erythema of bilateral lower extremities with increase in warmth, no papules or pustules, no skin tear. There is a ~10cm in diameter macular, dark area on the lateral lower left leg (patient reports having this area bruised years ago when her leg got caught on the car door).  Psychiatry:  Mood & affect appropriate.     Data Reviewed: I have personally reviewed following labs and imaging studies  CBC: Recent Labs  Lab 04/21/19 1539 04/22/19 0546 04/23/19 0647  WBC 6.4 6.3 5.5  HGB 12.8 11.6* 11.4*  HCT 43.5 39.4 36.8  MCV 99.3 102.3* 96.3  PLT 154 145* 559*   Basic Metabolic Panel: Recent Labs  Lab 04/21/19 1539 04/22/19 0213 04/22/19 0546 04/23/19 0647  NA 140  --  140  --   K 3.0*  --  3.2*  --   CL 102  --  106  --   CO2 25  --  22  --   GLUCOSE 231*  --  162*  --   BUN 10  --  10  --   CREATININE 2.18*  --  2.75*  --   CALCIUM 8.4*  --  8.1*  --   MG  --  1.5*  --  1.7   GFR: Estimated Creatinine Clearance: 21.6 mL/min (A) (by C-G formula based on SCr of 2.75 mg/dL (H)). Liver Function Tests: Recent Labs  Lab 04/21/19 1539  AST 33  ALT 36  ALKPHOS 94  BILITOT 0.6  PROT 5.5*  ALBUMIN 3.0*   No results for input(s): LIPASE, AMYLASE in the last 168 hours. No results for input(s): AMMONIA in the last 168 hours. Coagulation Profile: No results for input(s): INR, PROTIME in the last 168 hours. Cardiac Enzymes: No results for input(s): CKTOTAL, CKMB, CKMBINDEX, TROPONINI in the last 168 hours. BNP (last 3 results) No results for input(s): PROBNP in the last 8760 hours. HbA1C: Recent Labs     04/22/19 0213  HGBA1C 7.4*   CBG: Recent Labs  Lab 04/22/19 1455 04/22/19 1655 04/22/19 2108 04/23/19 0647 04/23/19 1133  GLUCAP 161* 162* 202* 173* 187*   Lipid Profile: No results for input(s): CHOL, HDL, LDLCALC, TRIG, CHOLHDL, LDLDIRECT in the last 72 hours. Thyroid Function Tests: Recent Labs    04/22/19 0213  TSH 2.256   Anemia Panel: No results for input(s): VITAMINB12, FOLATE, FERRITIN, TIBC, IRON, RETICCTPCT in the last 72 hours. Sepsis Labs: Recent Labs  Lab 04/22/19 1455 04/22/19 1802  LATICACIDVEN 1.2 1.9    Recent Results (from the past 240 hour(s))  SARS CORONAVIRUS 2 (TAT 6-24 HRS) Nasopharyngeal Nasopharyngeal Swab     Status: None   Collection Time: 04/21/19 10:10 PM   Specimen: Nasopharyngeal Swab  Result Value Ref Range Status   SARS Coronavirus 2 NEGATIVE NEGATIVE Final    Comment: (NOTE) SARS-CoV-2 target nucleic acids are NOT DETECTED. The SARS-CoV-2 RNA is generally detectable in upper and lower respiratory  specimens during the acute phase of infection. Negative results do not preclude SARS-CoV-2 infection, do not rule out co-infections with other pathogens, and should not be used as the sole basis for treatment or other patient management decisions. Negative results must be combined with clinical observations, patient history, and epidemiological information. The expected result is Negative. Fact Sheet for Patients: SugarRoll.be Fact Sheet for Healthcare Providers: https://www.woods-mathews.com/ This test is not yet approved or cleared by the [place] FDA and  has been authorized for detection and/or diagnosis of SARS-CoV-2 by FDA under an Emergency Use Authorization (EUA). This EUA will remain  in effect (meaning this test can be used) for the duration of the COVID-19 declaration under Section 56 4(b)(1) of the Act, 21 U.S.C. section 360bbb-3(b)(1), unless the authorization is terminated  or revoked sooner. Performed at [facility], [place] [address] [place], [place] [address]   Culture, blood (routine x 2)     Status: None (Preliminary result)   Collection Time: 04/22/19  2:15 PM   Specimen: BLOOD RIGHT ARM  Result Value Ref Range Status   Specimen Description BLOOD RIGHT ARM  Final   Special Requests   Final    BOTTLES DRAWN AEROBIC ONLY Blood Culture adequate volume   Culture   Final    NO GROWTH < 24 HOURS Performed at [facility], [place] [address] [place], [place] [address]    Report Status PENDING  Incomplete  Culture, blood (routine x 2)     Status: None (Preliminary result)   Collection Time: 04/22/19  2:45 PM   Specimen: BLOOD LEFT ARM  Result Value Ref Range Status   Specimen Description BLOOD LEFT ARM  Final   Special Requests   Final    BOTTLES DRAWN AEROBIC ONLY Blood Culture adequate volume   Culture   Final    NO GROWTH < 24 HOURS Performed at [facility], [place] [address] [place], [place] [address]    Report Status PENDING  Incomplete  Culture, Urine     Status: Abnormal (Preliminary result)   Collection Time: 04/22/19  4:11 PM   Specimen: Urine, Catheterized  Result Value Ref Range Status   Specimen Description URINE, CATHETERIZED  Final   Special Requests   Final    NONE Performed at [facility], [place] [address] [place], [place] [address]    Culture >=100,000 COLONIES/mL KLEBSIELLA PNEUMONIAE (A)  Final   Report Status PENDING  Incomplete  MRSA PCR Screening     Status: None   Collection Time: 04/22/19 10:07 PM   Specimen: Nasopharyngeal  Result Value Ref Range Status   MRSA by PCR NEGATIVE NEGATIVE Final    Comment:        The GeneXpert MRSA Assay (FDA approved for NASAL specimens only), is one component of a comprehensive MRSA colonization surveillance program. It is not intended to diagnose MRSA infection nor to guide or monitor treatment for MRSA infections. Performed at [facility], [place] [address], [place], [place] [address]          Radiology Studies: Ct Chest Wo Contrast  Result Date: 04/21/2019 CLINICAL DATA:  Chest pain, shortness of breath. EXAM: CT CHEST WITHOUT CONTRAST TECHNIQUE: Multidetector CT imaging of the chest was performed following the standard protocol without IV contrast. COMPARISON:  Radiograph of same day. CT scan of November 24, 2018. FINDINGS: Cardiovascular: No significant vascular findings. Normal heart size. No pericardial effusion. Mediastinum/Nodes: No enlarged mediastinal or axillary  lymph nodes. Thyroid gland, trachea, and esophagus demonstrate no significant findings. Lungs/Pleura: No pneumothorax or pleural effusion is noted. Stable bronchiectasis and probable scarring is noted in both lower lobes. No definite acute abnormality is noted. Upper Abdomen: No acute abnormality. Musculoskeletal: No chest wall mass or suspicious bone lesions identified. IMPRESSION: Stable bronchiectasis and probable scarring is noted in both lower lobes. No acute abnormality seen in the chest. Electronically Signed   By: Marijo Conception M.D.   On: 04/21/2019 19:25   Nm Pulmonary Perfusion  Result Date: 04/22/2019 CLINICAL DATA:  Chest pain, shortness of breath, COPD EXAM: NUCLEAR MEDICINE PERFUSION LUNG SCAN TECHNIQUE: Perfusion images were obtained in multiple projections after intravenous injection of radiopharmaceutical. Ventilation scans intentionally deferred if perfusion scan and chest x-ray adequate for interpretation during COVID 19 epidemic. RADIOPHARMACEUTICALS:  1.45 mCi Tc-50mMAA IV COMPARISON:  Chest x-ray 04/21/2019 FINDINGS: Perfusion images of the bilateral lungs demonstrate no segmental perfusion defects within either lung IMPRESSION: Very low probability for pulmonary embolism. Electronically Signed   By: NDavina PokeM.D.   On: 04/22/2019 14:59   [place]al  Result Date: 04/22/2019 CLINICAL DATA:  Acute kidney injury EXAM: RENAL / URINARY TRACT ULTRASOUND  COMPLETE COMPARISON:  None. FINDINGS: Right Kidney: Renal measurements: 9.8 x 5.2 x 5.3 cm = volume: 139 mL. Upper pole cyst measures up to 6.1 cm. Parapelvic cyst measures 2.8 cm. No hydronephrosis. Normal echotexture. Left Kidney: Renal measurements: 8.5 x 4.5 x 3.8 cm = volume: 76 mL. 1.4 cm exophytic cyst off the midpole. Normal echotexture. No hydronephrosis. Bladder: Appears normal for degree of bladder distention. Other: None. IMPRESSION: Bilateral renal cysts. No acute findings.  No hydronephrosis. Electronically Signed   By: KRolm BaptiseM.D.   On: 04/22/2019 00:43   Vas UKoreaLower Extremity Venous (dvt)  Result Date: 04/22/2019  Lower Venous Study Indications: Pulmonary embolism, Swelling, and Pain.  Limitations: Body habitus, poor ultrasound/tissue interface and pain, restricted mobility. Comparison Study: Negative lower extremity venous duplex 06/03/2018 Performing Technologist: MMaudry MayhewMHA, RDMS, RVT, RDCS  Examination Guidelines: A complete evaluation includes B-mode imaging, spectral Doppler, color Doppler, and power Doppler as needed of all accessible portions of each vessel. Bilateral testing is considered an integral part of a complete examination. Limited examinations for reoccurring indications may be performed as noted.  +---------+---------------+---------+-----------+----------+--------------+ RIGHT    CompressibilityPhasicitySpontaneityPropertiesThrombus Aging +---------+---------------+---------+-----------+----------+--------------+ CFV                                                   Not visualized +---------+---------------+---------+-----------+----------+--------------+ SFJ                                                   Not visualized +---------+---------------+---------+-----------+----------+--------------+ FV Prox  Full                                                         +---------+---------------+---------+-----------+----------+--------------+ FV Mid   Full                                                        +---------+---------------+---------+-----------+----------+--------------+  FV DistalFull                                                        +---------+---------------+---------+-----------+----------+--------------+ PFV                                                   Not visualized +---------+---------------+---------+-----------+----------+--------------+ POP      Full           Yes      Yes                                 +---------+---------------+---------+-----------+----------+--------------+ PTV                                                   Not visualized +---------+---------------+---------+-----------+----------+--------------+ PERO                                                  Not visualized +---------+---------------+---------+-----------+----------+--------------+   +---------+---------------+---------+-----------+----------+--------------+ LEFT     CompressibilityPhasicitySpontaneityPropertiesThrombus Aging +---------+---------------+---------+-----------+----------+--------------+ CFV                                                   Not visualized +---------+---------------+---------+-----------+----------+--------------+ SFJ                                                   Not visualized +---------+---------------+---------+-----------+----------+--------------+ FV Prox  Full                                                        +---------+---------------+---------+-----------+----------+--------------+ FV Mid   Full                                                        +---------+---------------+---------+-----------+----------+--------------+ FV Distal                                             Not visualized  +---------+---------------+---------+-----------+----------+--------------+ PFV  Not visualized +---------+---------------+---------+-----------+----------+--------------+ POP      Full           Yes      Yes                                 +---------+---------------+---------+-----------+----------+--------------+ PTV                                                   Not visualized +---------+---------------+---------+-----------+----------+--------------+ PERO                                                  Not visualized +---------+---------------+---------+-----------+----------+--------------+     Summary: Right: There is no evidence of deep vein thrombosis in the lower extremity. However, portions of this examination were limited- see technologist comments above. No cystic structure found in the popliteal fossa. Left: There is no evidence of deep vein thrombosis in the lower extremity. However, portions of this examination were limited- see technologist comments above. No cystic structure found in the popliteal fossa.  *See table(s) above for measurements and observations. Electronically signed by Deitra Mayo MD on 04/22/2019 at 7:28:13 PM.    Final         Scheduled Meds: . allopurinol  300 mg Oral Daily  . atorvastatin  10 mg Oral QPM  . cholecalciferol  2,000 Units Oral Q lunch  . DULoxetine  60 mg Oral Daily  . fluticasone  2 spray Each Nare Daily  . gabapentin  1,200 mg Oral TID  . guaiFENesin  1,200 mg Oral BID  . heparin injection (subcutaneous)  5,000 Units Subcutaneous Q8H  . hydroxychloroquine  400 mg Oral Daily  . insulin aspart  0-5 Units Subcutaneous QHS  . insulin aspart  0-9 Units Subcutaneous TID WC  . leflunomide  10 mg Oral Daily  . levothyroxine  50 mcg Oral QAC breakfast  . metoprolol tartrate  12.5 mg Oral BID  . multivitamin with minerals  1 tablet Oral Daily  . pantoprazole  40 mg  Oral Daily   Continuous Infusions: . sodium chloride 100 mL/hr at 04/23/19 1231  . ceFEPime (MAXIPIME) IV 2 g (04/23/19 1026)     LOS: 1 day    Time spent: 35 minutes    Blain Pais, MD [facility]   If 7PM-7AM, please contact night-coverage [email or web address] Password [facility] 04/23/2019, 3:38 PM

## 2019-04-24 LAB — BASIC METABOLIC PANEL
Anion gap: 6 (ref 5–15)
Anion gap: 9 (ref 5–15)
BUN: 6 mg/dL — ABNORMAL LOW (ref 8–23)
BUN: <5 mg/dL — ABNORMAL LOW (ref 8–23)
CO2: 22 mmol/L (ref 22–32)
CO2: 18 mmol/L — ABNORMAL LOW (ref 22–32)
Calcium: 7.8 mg/dL — ABNORMAL LOW (ref 8.9–10.3)
Calcium: 7.7 mg/dL — ABNORMAL LOW (ref 8.9–10.3)
Chloride: 111 mmol/L (ref 98–111)
Chloride: 112 mmol/L — ABNORMAL HIGH (ref 98–111)
Creatinine, Ser: 0.89 mg/dL (ref 0.44–1.00)
Creatinine, Ser: 0.77 mg/dL (ref 0.44–1.00)
GFR calc Af Amer: >60 mL/min (ref >60)
GFR calc Af Amer: >60 mL/min (ref >60)
GFR calc non Af Amer: >60 mL/min (ref >60)
GFR calc non Af Amer: >60 mL/min (ref >60)
Glucose, Bld: 143 mg/dL — ABNORMAL HIGH (ref 70–99)
Glucose, Bld: 171 mg/dL — ABNORMAL HIGH (ref 70–99)
Potassium: 3.5 mmol/L (ref 3.5–5.1)
Potassium: 5 mmol/L (ref 3.5–5.1)
Sodium: 139 mmol/L (ref 135–145)
Sodium: 139 mmol/L (ref 135–145)

## 2019-04-24 LAB — URINE CULTURE: Culture: >=100000 — AB

## 2019-04-24 LAB — CBC
HCT: 37.4 % (ref 36.0–46.0)
Hemoglobin: 11.4 g/dL — ABNORMAL LOW (ref 12.0–15.0)
MCH: 29.7 pg (ref 26.0–34.0)
MCHC: 30.5 g/dL (ref 30.0–36.0)
MCV: 97.4 fL (ref 80.0–100.0)
Platelets: 125 10*3/uL — ABNORMAL LOW (ref 150–400)
RBC: 3.84 MIL/uL — ABNORMAL LOW (ref 3.87–5.11)
RDW: 16 % — ABNORMAL HIGH (ref 11.5–15.5)
WBC: 4.8 10*3/uL (ref 4.0–10.5)
nRBC: 0 % (ref 0.0–0.2)

## 2019-04-24 LAB — C DIFFICILE QUICK SCREEN W PCR REFLEX
C Diff antigen: NEGATIVE
C Diff interpretation: NOT DETECTED
C Diff toxin: NEGATIVE

## 2019-04-24 LAB — GLUCOSE, CAPILLARY
Glucose-Capillary: 120 mg/dL — ABNORMAL HIGH (ref 70–99)
Glucose-Capillary: 166 mg/dL — ABNORMAL HIGH (ref 70–99)
Glucose-Capillary: 152 mg/dL — ABNORMAL HIGH (ref 70–99)
Glucose-Capillary: 140 mg/dL — ABNORMAL HIGH (ref 70–99)

## 2019-04-24 LAB — VANCOMYCIN, RANDOM: Vancomycin Rm: <4

## 2019-04-24 MED ORDER — LOPERAMIDE HCL 2 MG PO CAPS
4.0000 mg | ORAL_CAPSULE | Freq: Once | ORAL | Status: AC
  Administered 2019-04-24: 4 mg via ORAL
  Filled 2019-04-24: qty 2

## 2019-04-24 MED ORDER — SODIUM CHLORIDE 0.9 % IV SOLN
2.0000 g | Freq: Three times a day (TID) | INTRAVENOUS | Status: DC
  Administered 2019-04-24 – 2019-04-25 (×2): 2 g via INTRAVENOUS
  Filled 2019-04-24 (×4): qty 2

## 2019-04-24 MED ORDER — MORPHINE SULFATE (PF) 2 MG/ML IV SOLN: 1.0000 mg | INTRAVENOUS | Status: DC | PRN

## 2019-04-24 MED ORDER — GUAIFENESIN 100 MG/5ML PO SOLN: 5.0000 mL | ORAL | Status: DC | PRN

## 2019-04-24 MED ORDER — LIDOCAINE 5 % EX PTCH
1.0000 | MEDICATED_PATCH | CUTANEOUS | Status: DC
  Administered 2019-04-24 – 2019-04-27 (×4): 1 via TRANSDERMAL
  Filled 2019-04-24 (×4): qty 1

## 2019-04-24 MED ORDER — SODIUM CHLORIDE 0.9 % IV SOLN
2.0000 g | Freq: Two times a day (BID) | INTRAVENOUS | Status: DC
  Administered 2019-04-24: 2 g via INTRAVENOUS
  Filled 2019-04-24 (×2): qty 2

## 2019-04-24 MED ORDER — TIZANIDINE HCL 4 MG PO TABS
4.0000 mg | ORAL_TABLET | Freq: Three times a day (TID) | ORAL | Status: DC | PRN
  Administered 2019-04-25: 4 mg via ORAL
  Filled 2019-04-24: qty 1

## 2019-04-24 MED ORDER — VANCOMYCIN HCL 10 G IV SOLR
2000.0000 mg | Freq: Once | INTRAVENOUS | Status: AC
  Administered 2019-04-24: 2000 mg via INTRAVENOUS
  Filled 2019-04-24: qty 2000

## 2019-04-24 MED ORDER — VANCOMYCIN HCL 10 G IV SOLR
1750.0000 mg | INTRAVENOUS | Status: DC
  Filled 2019-04-24: qty 1750

## 2019-04-24 NOTE — Plan of Care (Signed)
  Problem: Nutrition: Goal: Adequate nutrition will be maintained Outcome: Completed/Met   Problem: Elimination: Goal: Will not experience complications related to bowel motility Outcome: Completed/Met

## 2019-04-24 NOTE — Progress Notes (Signed)
Pharmacy Antibiotic Note  [name] is a 78 y.o. female admitted on 04/21/2019 with cellulitis.  Pharmacy has been consulted for vancomycin and cefepime dosing.  Presenting with bilateral lower extremity redness- WBC 6.3, afebrile. Hypotensive. Scr 2.75 (CrCl 21 mL/min) - last Scr on profile 1.22 in January 2020.   Scr much improved today, down to 0.89.  UCx with Klebsiella (should be covered with cefepime).  Given improvement in renal function, checked a random vancomycin level about 40 hrs after last dose given - undetectable.  Plan: Reload vancomycin 2g x 1, then increase dose to vancomycin 1750 mg q 24 hrs. Increase cefepime to 2g q 8 hrs. Monitor renal fx, cx results, clinical pic, and vanc levels as appropriate   Height: 5' 4" (162.6 cm) Weight: 269 lb 3.2 oz (122.1 kg) IBW/kg (Calculated) : 54.7  Temp (24hrs), Avg:98.3 F (36.8 C), Min:98 F (36.7 C), Max:98.8 F (37.1 C)  Recent Labs  Lab 04/21/19 1539 04/22/19 0546 04/22/19 1455 04/22/19 1802 04/23/19 0647 04/24/19 0522 04/24/19 0924  WBC 6.4 6.3  --   --  5.5 4.8  --   CREATININE 2.18* 2.75*  --   --   --  0.89  --   LATICACIDVEN  --   --  1.2 1.9  --   --   --   VANCORANDOM  --   --   --   --   --   --  <4    Estimated Creatinine Clearance: 68.3 mL/min (by C-G formula based on SCr of 0.89 mg/dL).    Allergies  Allergen Reactions  . Codeine Swelling and Other (See Comments)    Facial swelling Chest pain and swelling in legs  Facial swelling  . Infliximab Anaphylaxis    "sent me into shock" "sent me into shock"  . Lisinopril Swelling    Face and neck swelling Face and neck swelling    Antimicrobials this admission: Vancomycin  12/4 >>  Cefepime 12/4 >>   Dose adjustments this admission: N/A  Microbiology results: 12/4 BCx x 2: ngtd 12/4 UCx: > 100K klebsiella - R amp/NF, TMP/Smx, otherwise S 12/3 COVID - neg  Thank you for allowing pharmacy to be a part of this patient's  care.  [name] Olea, [facility] Clinical Pharmacist Phone [phone]  04/24/2019 11:10 AM

## 2019-04-24 NOTE — Progress Notes (Signed)
PROGRESS NOTE    [name]  ZOX:[id number] DOB: 10/13/1940 DOA: 04/21/2019 PCP: [name], MD   Brief Narrative:  [name] is a 78 y.o. female with medical history significant of chronic diastolic congestive heart failure, CKD, asthma, COPD, interstitial lung disease, chronic respiratory failure on 2 L home oxygen at night, depression, type 2 diabetes, hyperlipidemia, hypertension, hypothyroidism, morbid obesity (BMI 44.96), gout, clotting disorder, history of DVT previously on Eliquis, rheumatoid arthritis presenting with complaints of chest pain and shortness of breath.    She has been hypotensive since her presentation.  Bilateral lower extremity redness.  Assessment & Plan:   Principal Problem:   Chest pain Active Problems:   Thyroid disease   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)   Hypotension   Hypokalemia   Sepsis. poa Patient has persistent tachycardia and hypotension.  No leukocytosis or fever though. Source is concerning for bilateral lower extremity cellulitis and UTI.   -Follow up on urine and blood cultures.   Continue IV fluids.  Normal saline bolus as needed for blood pressure support.  Continue  empiric antibiotic.  Chest x-ray with no consolidation.    -Continue broad spectrum Abx, vancomycin dosed per pharmacy.    Chest pain/dyspnea - Resolved.  Patient does have a history of prior DVT for which she was on Eliquis which was stopped a month ago.   -VQ scan and bilateral lower extremity Doppler negative -Discontinued heparin drip. -Continuous pulse ox, supplemental oxygen as needed   Hypotension Due to sepsis.  -Hold home blood pressure medications -IV fluid resuscitation to keep MAP >65 -Started on antibiotics per above.  Hypokalemia Will replete as needed and repeat level in the morning.  Magnesium level also low.  Replete as needed -Hold Lasix  Non-insulin-dependent type 2 diabetes Continue sliding scale insulin  sensitive and CBG checks.  AKI versus progressively worsening CKD BUN, creatinine 2.1.  Baseline creatinine 1.0-1.1 about a year ago.  No recent labs for comparison.  Patient states her nephrologist told her her renal function has been worsening, confirms that she has CKD3. Likely has acute on chronic renal failure due to prerenal due to hypotension.  Takes Lasix and olmesartan at home. renal ultrasound unremarkable. -Continue IV fluids.  Continue monitoring urine output.  Avoid nephrotoxic medications--continue vancomycin renally dosed.  Chronic diastolic congestive heart failure BNP normal.  CT without evidence of pulmonary edema. -Hold diuretic and ARB due to hypotension  Hypertension -Hold antihypertensives at this time given current hypotension  Interstitial lung disease -Stable.    Continue Combivent as needed.  Gout -Continue allopurinol  Hyperlipidemia -Continue Lipitor  Chronic pain/neuropathy -Continue gabapentin  Rheumatoid arthritis -Continue Plaquenil and leflunomide  Hypothyroidism -Continue Synthroid -Check TSH level  GERD -Continue PPI  Osteoarthritis.  Patient takes morphine 84m po BID at home. This is hold due to labile BP. Will continue low dose morphine IV prn, start lidocaine patch for right hip pain, start PT, start tizanidine prn as muscle relaxer.  -If BP stable overnight, may restart home po morphine.    DVT prophylaxis: Heparin [place] Code Status: Full code Family Communication: Son at the bedside Disposition Plan: Continue inpatient care, anticipated 2 midnight stay.  The risks of not treating his inpatient include progression to severe sepsis and septic shock. Consultants:   None  Procedures: None  Antimicrobials:  Vancomycin 12/4>> Cefepime 12/4>>  Subjective: She has hip pain, back pain, reports having chronic back pain due to OSA. Has no been ambulating.  Objective: Vitals:   04/24/19 0500 04/24/19 0600 04/24/19 0835  04/24/19 0958  BP: 112/65 118/76    Pulse:    (!) 110  Resp:   17 16  Temp:   98.8 F (37.1 C)   TempSrc:   Oral   SpO2:    99%  Weight: 122.1 kg     Height:        Intake/Output Summary (Last 24 hours) at 04/24/2019 1234 Last data filed at 04/24/2019 0851 Gross per 24 hour  Intake 2842.82 ml  Output 352 ml  Net 2490.82 ml   Filed Weights   04/22/19 1141 04/23/19 0500 04/24/19 0500  Weight: 120.2 kg 117.5 kg 122.1 kg    Examination:  General exam: Appears calm and comfortable  Respiratory system: No respiratory distress, no wheezing.  Cardiovascular system: S1 & S2. Distal pulses present. Normal rate.  Gastrointestinal system: Soft, NT, ND Central nervous system: Alert and orientedx3. No focal neurological deficits. Extremities: Symmetric 5 x 5 power. Skin: improving erythema of bilateral lower extremities with increase in warmth, no papules or pustules, no skin tear. There is a ~10cm in diameter macular, dark area on the lateral lower left leg (patient reports having this area bruised years ago when her leg got caught on the car door).  Psychiatry:  Mood & affect appropriate.     Data Reviewed: I have personally reviewed following labs and imaging studies  CBC: Recent Labs  Lab 04/21/19 1539 04/22/19 0546 04/23/19 0647 04/24/19 0522  WBC 6.4 6.3 5.5 4.8  HGB 12.8 11.6* 11.4* 11.4*  HCT 43.5 39.4 36.8 37.4  MCV 99.3 102.3* 96.3 97.4  PLT 154 145* 139* 127*   Basic Metabolic Panel: Recent Labs  Lab 04/21/19 1539 04/22/19 0213 04/22/19 0546 04/23/19 0647 04/24/19 0522  NA 140  --  140  --  139  K 3.0*  --  3.2*  --  3.5  CL 102  --  106  --  111  CO2 25  --  22  --  22  GLUCOSE 231*  --  162*  --  143*  BUN 10  --  10  --  6*  CREATININE 2.18*  --  2.75*  --  0.89  CALCIUM 8.4*  --  8.1*  --  7.8*  MG  --  1.5*  --  1.7  --    GFR: Estimated Creatinine Clearance: 68.3 mL/min (by C-G formula based on SCr of 0.89 mg/dL). Liver Function Tests: Recent  Labs  Lab 04/21/19 1539  AST 33  ALT 36  ALKPHOS 94  BILITOT 0.6  PROT 5.5*  ALBUMIN 3.0*   No results for input(s): LIPASE, AMYLASE in the last 168 hours. No results for input(s): AMMONIA in the last 168 hours. Coagulation Profile: No results for input(s): INR, PROTIME in the last 168 hours. Cardiac Enzymes: No results for input(s): CKTOTAL, CKMB, CKMBINDEX, TROPONINI in the last 168 hours. BNP (last 3 results) No results for input(s): PROBNP in the last 8760 hours. HbA1C: Recent Labs    04/22/19 0213  HGBA1C 7.4*   CBG: Recent Labs  Lab 04/23/19 0647 04/23/19 1133 04/23/19 1654 04/23/19 2133 04/24/19 0553  GLUCAP 173* 187* 171* 176* 120*   Lipid Profile: No results for input(s): CHOL, HDL, LDLCALC, TRIG, CHOLHDL, LDLDIRECT in the last 72 hours. Thyroid Function Tests: Recent Labs    04/22/19 0213  TSH 2.256   Anemia Panel: No results for input(s): VITAMINB12, FOLATE, FERRITIN, TIBC, IRON, RETICCTPCT in the  last 72 hours. Sepsis Labs: Recent Labs  Lab 04/22/19 1455 04/22/19 1802  LATICACIDVEN 1.2 1.9    Recent Results (from the past 240 hour(s))  SARS CORONAVIRUS 2 (TAT 6-24 HRS) Nasopharyngeal Nasopharyngeal Swab     Status: None   Collection Time: 04/21/19 10:10 PM   Specimen: Nasopharyngeal Swab  Result Value Ref Range Status   SARS Coronavirus 2 NEGATIVE NEGATIVE Final    Comment: (NOTE) SARS-CoV-2 target nucleic acids are NOT DETECTED. The SARS-CoV-2 RNA is generally detectable in upper and lower respiratory specimens during the acute phase of infection. Negative results do not preclude SARS-CoV-2 infection, do not rule out co-infections with other pathogens, and should not be used as the sole basis for treatment or other patient management decisions. Negative results must be combined with clinical observations, patient history, and epidemiological information. The expected result is Negative. Fact Sheet for Patients:  SugarRoll.be Fact Sheet for Healthcare Providers: https://www.woods-mathews.com/ This test is not yet approved or cleared by the [place] FDA and  has been authorized for detection and/or diagnosis of SARS-CoV-2 by FDA under an Emergency Use Authorization (EUA). This EUA will remain  in effect (meaning this test can be used) for the duration of the COVID-19 declaration under Section 56 4(b)(1) of the Act, 21 U.S.C. section 360bbb-3(b)(1), unless the authorization is terminated or revoked sooner. Performed at [facility], [place] [address] [place], [place] [address]   Culture, blood (routine x 2)     Status: None (Preliminary result)   Collection Time: 04/22/19  2:15 PM   Specimen: BLOOD RIGHT ARM  Result Value Ref Range Status   Specimen Description BLOOD RIGHT ARM  Final   Special Requests   Final    BOTTLES DRAWN AEROBIC ONLY Blood Culture adequate volume   Culture   Final    NO GROWTH 2 DAYS Performed at [facility], [place] [address] [place], [place] [address]    Report Status PENDING  Incomplete  Culture, blood (routine x 2)     Status: None (Preliminary result)   Collection Time: 04/22/19  2:45 PM   Specimen: BLOOD LEFT ARM  Result Value Ref Range Status   Specimen Description BLOOD LEFT ARM  Final   Special Requests   Final    BOTTLES DRAWN AEROBIC ONLY Blood Culture adequate volume   Culture   Final    NO GROWTH 2 DAYS Performed at [facility], [place] [address] [place], [place] [address]    Report Status PENDING  Incomplete  Culture, Urine     Status: Abnormal   Collection Time: 04/22/19  4:11 PM   Specimen: Urine, Catheterized  Result Value Ref Range Status   Specimen Description URINE, CATHETERIZED  Final   Special Requests   Final    NONE Performed at [facility], [place] [address], [place],  [place] [address]    Culture >=100,000 COLONIES/mL KLEBSIELLA PNEUMONIAE (A)  Final   Report Status  04/24/2019 FINAL  Final   Organism ID, Bacteria KLEBSIELLA PNEUMONIAE (A)  Final      Susceptibility   Klebsiella pneumoniae - MIC*    AMPICILLIN >=32 RESISTANT Resistant     CEFAZOLIN <=4 SENSITIVE Sensitive     CEFTRIAXONE <=1 SENSITIVE Sensitive     CIPROFLOXACIN <=0.25 SENSITIVE Sensitive     GENTAMICIN <=1 SENSITIVE Sensitive     IMIPENEM <=0.25 SENSITIVE Sensitive     NITROFURANTOIN 128 RESISTANT Resistant     TRIMETH/SULFA >=320 RESISTANT Resistant  AMPICILLIN/SULBACTAM 4 SENSITIVE Sensitive     PIP/TAZO <=4 SENSITIVE Sensitive     Extended ESBL NEGATIVE Sensitive     * >=100,000 COLONIES/mL KLEBSIELLA PNEUMONIAE  MRSA PCR Screening     Status: None   Collection Time: 04/22/19 10:07 PM   Specimen: Nasopharyngeal  Result Value Ref Range Status   MRSA by PCR NEGATIVE NEGATIVE Final    Comment:        The GeneXpert MRSA Assay (FDA approved for NASAL specimens only), is one component of a comprehensive MRSA colonization surveillance program. It is not intended to diagnose MRSA infection nor to guide or monitor treatment for MRSA infections. Performed at [facility], [place] [address] [place] [address], [place], [place] [address]          Radiology Studies: Nm Pulmonary Perfusion  Result Date: 04/22/2019 CLINICAL DATA:  Chest pain, shortness of breath, COPD EXAM: NUCLEAR MEDICINE PERFUSION LUNG SCAN TECHNIQUE: Perfusion images were obtained in multiple projections after intravenous injection of radiopharmaceutical. Ventilation scans intentionally deferred if perfusion scan and chest x-ray adequate for interpretation during COVID 19 epidemic. RADIOPHARMACEUTICALS:  1.45 mCi Tc-52mMAA IV COMPARISON:  Chest x-ray 04/21/2019 FINDINGS: Perfusion images of the bilateral lungs demonstrate no segmental perfusion defects within either lung IMPRESSION: Very low probability for pulmonary embolism. Electronically Signed   By: NDavina PokeM.D.   On: 04/22/2019 14:59        Scheduled  Meds: . allopurinol  300 mg Oral Daily  . atorvastatin  10 mg Oral QPM  . cholecalciferol  2,000 Units Oral Q lunch  . DULoxetine  60 mg Oral Daily  . fluticasone  2 spray Each Nare Daily  . gabapentin  1,200 mg Oral TID  . guaiFENesin  1,200 mg Oral BID  . heparin injection (subcutaneous)  5,000 Units Subcutaneous Q8H  . hydroxychloroquine  400 mg Oral Daily  . insulin aspart  0-5 Units Subcutaneous QHS  . insulin aspart  0-9 Units Subcutaneous TID WC  . leflunomide  10 mg Oral Daily  . levothyroxine  50 mcg Oral QAC breakfast  . lidocaine  1 patch Transdermal Q24H  . metoprolol tartrate  12.5 mg Oral BID  . multivitamin with minerals  1 tablet Oral Daily  . pantoprazole  40 mg Oral Daily   Continuous Infusions: . sodium chloride 100 mL/hr at 04/24/19 1041  . ceFEPime (MAXIPIME) IV    . [START ON 04/25/2019] vancomycin    . vancomycin 2,000 mg (04/24/19 1128)     LOS: 2 days    Time spent: 35 minutes    SBlain Pais MD [facility]   If 7PM-7AM, please contact night-coverage [email or web address] Password [email or web address]12/10/2018, 12:34 PM

## 2019-04-24 NOTE — Progress Notes (Signed)
PT Cancellation Note  Patient Details Name: [name] MRN: [id number] DOB: 1940/11/18   Cancelled Treatment:    Reason Eval/Treat Not Completed: Other (comment) Pt declining therapy evaluation secondary to right hip pain. Recently received pain patch from RN. Pt stating, "tomorrow."  Ellamae Sia, PT, DPT Acute Rehabilitation Services Pager [phone] Office [phone]    Willy Eddy 04/24/2019, 1:44 PM

## 2019-04-24 NOTE — Progress Notes (Signed)
Pt may benefit from a outpt sleep study, thanks [place]

## 2019-04-25 DIAGNOSIS — E876 Hypokalemia: Secondary | ICD-10-CM

## 2019-04-25 DIAGNOSIS — E079 Disorder of thyroid, unspecified: Secondary | ICD-10-CM

## 2019-04-25 DIAGNOSIS — E11628 Type 2 diabetes mellitus with other skin complications: Secondary | ICD-10-CM

## 2019-04-25 DIAGNOSIS — A419 Sepsis, unspecified organism: Secondary | ICD-10-CM

## 2019-04-25 DIAGNOSIS — R071 Chest pain on breathing: Secondary | ICD-10-CM | POA: Diagnosis not present

## 2019-04-25 DIAGNOSIS — N390 Urinary tract infection, site not specified: Secondary | ICD-10-CM

## 2019-04-25 DIAGNOSIS — L03119 Cellulitis of unspecified part of limb: Secondary | ICD-10-CM

## 2019-04-25 DIAGNOSIS — I9589 Other hypotension: Secondary | ICD-10-CM | POA: Diagnosis not present

## 2019-04-25 LAB — CBC
HCT: 38.1 % (ref 36.0–46.0)
Hemoglobin: 11.8 g/dL — ABNORMAL LOW (ref 12.0–15.0)
MCH: 29.7 pg (ref 26.0–34.0)
MCHC: 31 g/dL (ref 30.0–36.0)
MCV: 96 fL (ref 80.0–100.0)
Platelets: 132 10*3/uL — ABNORMAL LOW (ref 150–400)
RBC: 3.97 MIL/uL (ref 3.87–5.11)
RDW: 15.9 % — ABNORMAL HIGH (ref 11.5–15.5)
WBC: 5.4 10*3/uL (ref 4.0–10.5)
nRBC: 0 % (ref 0.0–0.2)

## 2019-04-25 LAB — BASIC METABOLIC PANEL
Anion gap: 10 (ref 5–15)
BUN: 5 mg/dL — ABNORMAL LOW (ref 8–23)
CO2: 22 mmol/L (ref 22–32)
Calcium: 8.1 mg/dL — ABNORMAL LOW (ref 8.9–10.3)
Chloride: 109 mmol/L (ref 98–111)
Creatinine, Ser: 0.82 mg/dL (ref 0.44–1.00)
GFR calc Af Amer: >60 mL/min (ref >60)
GFR calc non Af Amer: >60 mL/min (ref >60)
Glucose, Bld: 144 mg/dL — ABNORMAL HIGH (ref 70–99)
Potassium: 3.6 mmol/L (ref 3.5–5.1)
Sodium: 141 mmol/L (ref 135–145)

## 2019-04-25 LAB — GLUCOSE, CAPILLARY
Glucose-Capillary: 125 mg/dL — ABNORMAL HIGH (ref 70–99)
Glucose-Capillary: 139 mg/dL — ABNORMAL HIGH (ref 70–99)
Glucose-Capillary: 172 mg/dL — ABNORMAL HIGH (ref 70–99)
Glucose-Capillary: 162 mg/dL — ABNORMAL HIGH (ref 70–99)

## 2019-04-25 MED ORDER — PREDNISONE 10 MG PO TABS
10.0000 mg | ORAL_TABLET | Freq: Every morning | ORAL | Status: DC
  Administered 2019-04-25 – 2019-04-28 (×4): 10 mg via ORAL
  Filled 2019-04-25 (×5): qty 1

## 2019-04-25 MED ORDER — MORPHINE SULFATE ER 15 MG PO TBCR
15.0000 mg | EXTENDED_RELEASE_TABLET | Freq: Two times a day (BID) | ORAL | Status: DC
  Administered 2019-04-25 – 2019-04-28 (×7): 15 mg via ORAL
  Filled 2019-04-25 (×7): qty 1

## 2019-04-25 MED ORDER — SODIUM CHLORIDE 0.9 % IV SOLN
2.0000 g | INTRAVENOUS | Status: DC
  Administered 2019-04-25 – 2019-04-26 (×2): 2 g via INTRAVENOUS
  Filled 2019-04-25: qty 20
  Filled 2019-04-25: qty 2
  Filled 2019-04-25: qty 20

## 2019-04-25 MED ORDER — ASPIRIN BUF(CACARB-MGCARB-MGO) 325 MG PO TABS: 325.0000 mg | ORAL_TABLET | Freq: Every day | ORAL | Status: DC

## 2019-04-25 MED ORDER — COLCHICINE 0.6 MG PO TABS
0.6000 mg | ORAL_TABLET | Freq: Every day | ORAL | Status: DC
  Administered 2019-04-25 – 2019-04-28 (×4): 0.6 mg via ORAL
  Filled 2019-04-25 (×4): qty 1

## 2019-04-25 MED ORDER — LOPERAMIDE HCL 2 MG PO CAPS
4.0000 mg | ORAL_CAPSULE | Freq: Four times a day (QID) | ORAL | Status: DC | PRN
  Administered 2019-04-25: 4 mg via ORAL
  Filled 2019-04-25: qty 2

## 2019-04-25 MED ORDER — METOPROLOL SUCCINATE ER 100 MG PO TB24: 100.0000 mg | ORAL_TABLET | Freq: Every day | ORAL | Status: DC

## 2019-04-25 MED ORDER — PSYLLIUM 95 % PO PACK
1.0000 | PACK | Freq: Three times a day (TID) | ORAL | Status: AC
  Administered 2019-04-25 – 2019-04-26 (×5): 1 via ORAL
  Filled 2019-04-25 (×7): qty 1

## 2019-04-25 MED ORDER — ASPIRIN 325 MG PO TABS
325.0000 mg | ORAL_TABLET | Freq: Every day | ORAL | Status: DC
  Administered 2019-04-25 – 2019-04-28 (×4): 325 mg via ORAL
  Filled 2019-04-25 (×4): qty 1

## 2019-04-25 NOTE — Evaluation (Signed)
Physical Therapy Evaluation Patient Details Name: [name] MRN: [id number] DOB: Aug 07, 1940 Today's Date: 04/25/2019   History of Present Illness  Pt is a 79 yo female presenting with chest pain and SOB, determined to have cellulitis of BLE upon admission. Pt PMH is sig for CHF, CKD, asthma, COPD, ILD, depression, DM II, HLD, HTN, obesity, gout, prior DVT, and use of supplemental O2 at home at night.  Clinical Impression  The pt presents with sig impairments in functional mobility, activity tolerance, and increased reliance on supplemental O2. The pt was able to demo good bed mobility and transfers, requiring only min guard for line management and safety, but each of these movements quickly fatigue the pt. The pt required a seated rest after 5 ft ambulation with use of RW in her room on 2L O2, and was unable to perform a second bout of ambulation at this time. The pt will continue to benefit from a skilled PT to maximize independence and activity tolerance as well as a short stint at SNF to maximize rehab prior to return home.     Follow Up Recommendations SNF;Supervision/Assistance - 24 hour(pt reports she would prefer a short stint at SNF to maximixe rehab prior to return home so as not to be a burden on her husband and son)    Equipment Recommendations  (pt has needed equipment)    Recommendations for Other Services       Precautions / Restrictions Precautions Precautions: Fall Restrictions Weight Bearing Restrictions: No      Mobility  Bed Mobility Overal bed mobility: Needs Assistance Bed Mobility: Supine to Sit     Supine to sit: Min guard;HOB elevated     General bed mobility comments: pt with good use of bed rails and elevated HOB to move BLE to EOB and elevate trunk with supervision but no additional support  Transfers Overall transfer level: Needs assistance Equipment used: Rolling walker (2 wheeled) Transfers: Sit to/from Stand Sit to Stand: Min guard          General transfer comment: Pt able to stand from bedwith min guard and use of RW, good balance once standing. Complicated by pt height  Ambulation/Gait Ambulation/Gait assistance: Min guard Gait Distance (Feet): 5 Feet Assistive device: Rolling walker (2 wheeled) Gait Pattern/deviations: Step-to pattern;Decreased stride length;Trunk flexed;Wide base of support   Gait velocity interpretation: <1.31 ft/sec, indicative of household ambulator General Gait Details: pt able to ambulate 5 ft in room with good maintainence of O2 sat on 2L O2. Pt reports 5/10 fatigue following 5 ft amb. Increased reliance on BUE support  Stairs            Wheelchair Mobility    Modified Rankin (Stroke Patients Only)       Balance Overall balance assessment: Needs assistance Sitting-balance support: Feet supported;No upper extremity supported Sitting balance-Leahy Scale: Good Sitting balance - Comments: supervision for safety   Standing balance support: Bilateral upper extremity supported;During functional activity Standing balance-Leahy Scale: Poor Standing balance comment: pt reliant on BUE for amb and dynamic stability                             Pertinent Vitals/Pain Pain Assessment: Faces Faces Pain Scale: Hurts a little bit Pain Location: R hip and low back Pain Descriptors / Indicators: Shooting;Grimacing Pain Intervention(s): Limited activity within patient's tolerance;Premedicated before session;Monitored during session;Repositioned    Home Living Family/patient expects to be discharged to:: Private residence  Living Arrangements: Spouse/significant other;Children(son moved in) Available Help at Discharge: Family;Friend(s);Available PRN/intermittently Type of Home: House Home Access: Stairs to enter Entrance Stairs-Rails: None Entrance Stairs-Number of Steps: 1 STE from garage  (small), 1 STE at front  door Home Layout: Able to live on main level with  bedroom/bathroom Home Equipment: Cane - single point;Walker - 2 wheels;Wheelchair - Insurance claims handler - 4 wheels;Bedside commode;Hand held shower head;Grab bars - tub/shower      Prior Function Level of Independence: Independent with assistive device(s)(laundry is upstairs, but son or family members help with that)         Comments: ambulates with rollator most often in home,  and uses a w/c for long distances     Hand Dominance   Dominant Hand: Right    Extremity/Trunk Assessment   Upper Extremity Assessment Upper Extremity Assessment: Overall WFL for tasks assessed(some weakness, pt able to use BUE to stand and mobilize in bed as well as support on RW)    Lower Extremity Assessment Lower Extremity Assessment: Overall WFL for tasks assessed(pt able to move BLE, some weakness, but mostly limited by fatigue)    Cervical / Trunk Assessment Cervical / Trunk Assessment: Normal  Communication   Communication: No difficulties  Cognition Arousal/Alertness: Awake/alert Behavior During Therapy: WFL for tasks assessed/performed Overall Cognitive Status: Within Functional Limits for tasks assessed                                        General Comments      Exercises     Assessment/Plan    PT Assessment Patient needs continued PT services  PT Problem List Decreased strength;Decreased mobility;Decreased range of motion;Decreased coordination;Obesity;Decreased activity tolerance;Cardiopulmonary status limiting activity;Decreased balance       PT Treatment Interventions Therapeutic exercise;Gait training;Functional mobility training;Therapeutic activities;Patient/family education;DME instruction    PT Goals (Current goals can be found in the Care Plan section)  Acute Rehab PT Goals Patient Stated Goal: to get back to doing her own shopping PT Goal Formulation: With patient Time For Goal Achievement: 05/09/19 Potential to Achieve Goals: Good     Frequency Min 3X/week   Barriers to discharge Decreased caregiver support Pt reports her husband and son who live at home are not able to provide amount of support she currently needs    Co-evaluation               AM-PAC PT "6 Clicks" Mobility  Outcome Measure Help needed turning from your back to your side while in a flat bed without using bedrails?: A Little Help needed moving from lying on your back to sitting on the side of a flat bed without using bedrails?: A Little Help needed moving to and from a bed to a chair (including a wheelchair)?: A Little Help needed standing up from a chair using your arms (e.g., wheelchair or bedside chair)?: A Little Help needed to walk in hospital room?: A Little Help needed climbing 3-5 steps with a railing? : A Lot 6 Click Score: 17    End of Session Equipment Utilized During Treatment: Gait belt;Oxygen(2L O2) Activity Tolerance: Patient limited by fatigue;Patient tolerated treatment well Patient left: in chair;with call bell/phone within reach;with chair alarm set Nurse Communication: Mobility status PT Visit Diagnosis: Unsteadiness on feet (R26.81);Difficulty in walking, not elsewhere classified (R26.2)    Time: 1194-1740 PT Time Calculation (min) (ACUTE ONLY): 53 min   Charges:  PT Evaluation $PT Eval Moderate Complexity: 1 Mod PT Treatments $Gait Training: 23-37 mins $Therapeutic Activity: 8-22 mins        Mickey Farber, PT, DPT   Acute Rehabilitation Department [phone]  Otho Bellows 04/25/2019, 3:16 PM

## 2019-04-25 NOTE — Progress Notes (Signed)
PROGRESS NOTE    [name]  XBW:[id number] DOB: May 03, 1941 DOA: 04/21/2019 PCP: [name], MD   Brief Narrative:  78 year old with history of diastolic CHF, CKD stage III, asthma, COPD, interstitial lung disease, chronic hypoxia on 2 L nasal cannula, depression, DM 2, HLD, HTN, hypothyroidism, morbid obesity with BMI greater than 40, history of DVT, rheumatoid arthritis, gout presented to the hospital with shortness of breath and chest pain.  Initially she was also hypotensive on admission.  Diagnosed with bilateral lower extremity cellulitis.   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  Continue IV fluids with caution. -Chest x-ray-negative -On IV vancomycin/cefepime-we will change to Rocephin  Nonspecific diarrhea -C. difficile-negative.  Monitor electrolytes, Imodium as needed.  Atypical chest pain/dyspnea, resolved -Has previous history of DVT used to be on Eliquis stopped about a month ago. -VQ scan, bilateral lower extremity Dopplers-negative -Discontinued heparin drip  Diabetes mellitus type 2, non-insulin-dependent Chronic peripheral neuropathy secondary to DM 2 -Insulin sliding scale and Accu-Chek.  Hemoglobin A1c 7.4 -Gabapentin  AKI on CKD stage IIIa, resolved.  Creatinine today 0.8 -Baseline creatinine 1.1.  Admission creatinine 2.1, peaked at 2.7.  Renal ultrasound-negative -Gentle hydration.  Monitor renal function and urine output.  ARB and Lasix on hold.  Will resume your next 24 hours  Chronic diastolic congestive heart failure Essential hypertension -No evidence of fluid overload.  Metoprolol 12.5 twice daily getting gentle hydration.  Low-dose Toprol-XL  100 mg. -Diuretic and ARB on hold  Interstitial lung disease with chronic hypoxia, 2 L nasal cannula -Stable.  As needed bronchodilators.  Prednisone 10 mg daily -I-S/flutter  Gout Rheumatoid arthritis -Allopurinol.  Plaquenil  Hypothyroidism -On Synthroid, TSH normal  GERD -PPI  Osteoarthritis -Pain control.    DVT prophylaxis: Subcu heparin Code Status: Full code Family Communication: We will speak with the daughter at bedside Disposition Plan: Maintain hospital stay for treatment of ongoing sepsis with IV antibiotics.  Consultants:   None  Procedures:   None  Antimicrobials:   Rocephin   Subjective: Had at least 10 bouts of watery stool over last 24 hours.  Still gets dyspnea on minimal exertion going from chair to bed.  Review of Systems Otherwise negative except as per HPI, including: General: Denies fever, chills, night sweats or unintended weight loss. Resp: Denies cough, wheezing Cardiac: Denies chest pain, palpitations, orthopnea, paroxysmal nocturnal dyspnea. GI: Denies abdominal pain, nausea, vomiting, diarrhea or constipation GU: Denies dysuria, frequency, hesitancy or incontinence MS: Denies muscle aches, joint pain or swelling Neuro: Denies headache, neurologic deficits (focal weakness, numbness, tingling), abnormal gait Psych: Denies anxiety, depression, SI/HI/AVH Skin: Denies new rashes or lesions ID: Denies sick contacts, exotic exposures, travel  Objective: Vitals:   04/25/19 0444 04/25/19 0455 04/25/19 0534 04/25/19 0648  BP: 126/68 139/66    Pulse:  (!) 109    Resp:  19    Temp: 99.1 F (37.3 C)  98.5 F (36.9 C)   TempSrc: Oral  Oral   SpO2:  100%    Weight:    125.1 kg  Height:        Intake/Output Summary (Last 24 hours) at 04/25/2019 0801 Last data filed at 04/25/2019 0648 Gross per 24 hour  Intake 2853.79 ml  Output 912 ml  Net 1941.79 ml   Filed Weights   04/23/19 0500 04/24/19 0500 04/25/19 0648  Weight: 117.5 kg  122.1 kg 125.1 kg    Examination:  General exam: Appears calm and comfortable, on 3 L nasal cannula Respiratory system: Bilateral diminished breath sounds Cardiovascular system: S1 & S2 heard, RRR. No JVD, murmurs, rubs, gallops or clicks. No pedal edema. Gastrointestinal system: Abdomen is nondistended, soft and nontender. No organomegaly or masses felt. Normal bowel sounds heard. Central nervous system: Alert and oriented. No focal neurological deficits. Extremities: Symmetric 5 x 5 power. Skin: Bilateral low4er extremity erythema extending midway up to her knees Psychiatry: Judgement and insight appear normal. Mood & affect appropriate.     Data Reviewed:   CBC: Recent Labs  Lab 04/21/19 1539 04/22/19 0546 04/23/19 0647 04/24/19 0522 04/25/19 0511  WBC 6.4 6.3 5.5 4.8 5.4  HGB 12.8 11.6* 11.4* 11.4* 11.8*  HCT 43.5 39.4 36.8 37.4 38.1  MCV 99.3 102.3* 96.3 97.4 96.0  PLT 154 145* 139* 125* 195*   Basic Metabolic Panel: Recent Labs  Lab 04/21/19 1539 04/22/19 0213 04/22/19 0546 04/23/19 0647 04/24/19 0522 04/24/19 1124 04/25/19 0511  NA 140  --  140  --  139 139 141  K 3.0*  --  3.2*  --  3.5 5.0 3.6  CL 102  --  106  --  111 112* 109  CO2 25  --  22  --  22 18* 22  GLUCOSE 231*  --  162*  --  143* 171* 144*  BUN 10  --  10  --  6* <5* 5*  CREATININE 2.18*  --  2.75*  --  0.89 0.77 0.82  CALCIUM 8.4*  --  8.1*  --  7.8* 7.7* 8.1*  MG  --  1.5*  --  1.7  --   --   --    GFR: Estimated Creatinine Clearance: 75.2 mL/min (by C-G formula based on SCr of 0.82 mg/dL). Liver Function Tests: Recent Labs  Lab 04/21/19 1539  AST 33  ALT 36  ALKPHOS 94  BILITOT 0.6  PROT 5.5*  ALBUMIN 3.0*   No results for input(s): LIPASE, AMYLASE in the last 168 hours. No results for input(s): AMMONIA in the last 168 hours. Coagulation Profile: No results for input(s): INR, PROTIME in the last 168 hours. Cardiac Enzymes: No results for input(s): CKTOTAL, CKMB, CKMBINDEX,  TROPONINI in the last 168 hours. BNP (last 3 results) No results for input(s): PROBNP in the last 8760 hours. HbA1C: No results for input(s): HGBA1C in the last 72 hours. CBG: Recent Labs  Lab 04/24/19 0553 04/24/19 1121 04/24/19 1639 04/24/19 2124 04/25/19 0619  GLUCAP 120* 166* 152* 140* 125*   Lipid Profile: No results for input(s): CHOL, HDL, LDLCALC, TRIG, CHOLHDL, LDLDIRECT in the last 72 hours. Thyroid Function Tests: No results for input(s): TSH, T4TOTAL, FREET4, T3FREE, THYROIDAB in the last 72 hours. Anemia Panel: No results for input(s): VITAMINB12, FOLATE, FERRITIN, TIBC, IRON, RETICCTPCT in the last 72 hours. Sepsis Labs: Recent Labs  Lab 04/22/19 1455 04/22/19 1802  LATICACIDVEN 1.2 1.9    Recent Results (from the past 240 hour(s))  SARS CORONAVIRUS 2 (TAT 6-24 HRS) Nasopharyngeal Nasopharyngeal Swab     Status: None   Collection Time: 04/21/19 10:10 PM   Specimen: Nasopharyngeal Swab  Result Value Ref Range Status   SARS Coronavirus 2 NEGATIVE NEGATIVE Final    Comment: (NOTE) SARS-CoV-2 target nucleic acids are NOT DETECTED. The  SARS-CoV-2 RNA is generally detectable in upper and lower respiratory specimens during the acute phase of infection. Negative results do not preclude SARS-CoV-2 infection, do not rule out co-infections with other pathogens, and should not be used as the sole basis for treatment or other patient management decisions. Negative results must be combined with clinical observations, patient history, and epidemiological information. The expected result is Negative. Fact Sheet for Patients: SugarRoll.be Fact Sheet for Healthcare Providers: https://www.woods-mathews.com/ This test is not yet approved or cleared by the [place] FDA and  has been authorized for detection and/or diagnosis of SARS-CoV-2 by FDA under an Emergency Use Authorization (EUA). This EUA will remain  in effect (meaning  this test can be used) for the duration of the COVID-19 declaration under Section 56 4(b)(1) of the Act, 21 U.S.C. section 360bbb-3(b)(1), unless the authorization is terminated or revoked sooner. Performed at [facility], [place] [address] [place], [place] [address]   Culture, blood (routine x 2)     Status: None (Preliminary result)   Collection Time: 04/22/19  2:15 PM   Specimen: BLOOD RIGHT ARM  Result Value Ref Range Status   Specimen Description BLOOD RIGHT ARM  Final   Special Requests   Final    BOTTLES DRAWN AEROBIC ONLY Blood Culture adequate volume   Culture   Final    NO GROWTH 2 DAYS Performed at [facility], [place] [address], [place], [place] [address]    Report Status PENDING  Incomplete  Culture, blood (routine x 2)     Status: None (Preliminary result)   Collection Time: 04/22/19  2:45 PM   Specimen: BLOOD LEFT ARM  Result Value Ref Range Status   Specimen Description BLOOD LEFT ARM  Final   Special Requests   Final    BOTTLES DRAWN AEROBIC ONLY Blood Culture adequate volume   Culture   Final    NO GROWTH 2 DAYS Performed at [facility], [place] [address], [place], [place] [address]    Report Status PENDING  Incomplete  Culture, Urine     Status: Abnormal   Collection Time: 04/22/19  4:11 PM   Specimen: Urine, Catheterized  Result Value Ref Range Status   Specimen Description URINE, CATHETERIZED  Final   Special Requests   Final    NONE Performed at [facility], [place] [address] [place], [place] [address]    Culture >=100,000 COLONIES/mL KLEBSIELLA PNEUMONIAE (A)  Final   Report Status 04/24/2019 FINAL  Final   Organism ID, Bacteria KLEBSIELLA PNEUMONIAE (A)  Final      Susceptibility   Klebsiella pneumoniae - MIC*    AMPICILLIN >=32 RESISTANT Resistant     CEFAZOLIN <=4 SENSITIVE Sensitive     CEFTRIAXONE <=1 SENSITIVE Sensitive     CIPROFLOXACIN <=0.25 SENSITIVE Sensitive     GENTAMICIN <=1 SENSITIVE Sensitive     IMIPENEM  <=0.25 SENSITIVE Sensitive     NITROFURANTOIN 128 RESISTANT Resistant     TRIMETH/SULFA >=320 RESISTANT Resistant     AMPICILLIN/SULBACTAM 4 SENSITIVE Sensitive     PIP/TAZO <=4 SENSITIVE Sensitive     Extended ESBL NEGATIVE Sensitive     * >=100,000 COLONIES/mL KLEBSIELLA PNEUMONIAE  MRSA PCR Screening     Status: None   Collection Time: 04/22/19 10:07 PM   Specimen: Nasopharyngeal  Result Value Ref Range Status   MRSA by PCR NEGATIVE NEGATIVE Final    Comment:        The GeneXpert MRSA  Assay (FDA approved for NASAL specimens only), is one component of a comprehensive MRSA colonization surveillance program. It is not intended to diagnose MRSA infection nor to guide or monitor treatment for MRSA infections. Performed at [facility], [place] [address], [place], [place] [address]   C difficile quick scan w PCR reflex     Status: None   Collection Time: 04/24/19  5:14 PM   Specimen: STOOL  Result Value Ref Range Status   C Diff antigen NEGATIVE NEGATIVE Final   C Diff toxin NEGATIVE NEGATIVE Final   C Diff interpretation No C. difficile detected.  Final    Comment: Performed at [facility], [place] [address] [place], [place] [address]         Radiology Studies: No results found.      Scheduled Meds: . allopurinol  300 mg Oral Daily  . atorvastatin  10 mg Oral QPM  . cholecalciferol  2,000 Units Oral Q lunch  . DULoxetine  60 mg Oral Daily  . fluticasone  2 spray Each Nare Daily  . gabapentin  1,200 mg Oral TID  . heparin injection (subcutaneous)  5,000 Units Subcutaneous Q8H  . hydroxychloroquine  400 mg Oral Daily  . insulin aspart  0-5 Units Subcutaneous QHS  . insulin aspart  0-9 Units Subcutaneous TID WC  . leflunomide  10 mg Oral Daily  . levothyroxine  50 mcg Oral QAC breakfast  . lidocaine  1 patch Transdermal Q24H  . metoprolol tartrate  12.5 mg Oral BID  . multivitamin with minerals  1 tablet Oral Daily  . pantoprazole  40 mg Oral Daily    Continuous Infusions: . sodium chloride 50 mL/hr at 04/24/19 1252  . ceFEPime (MAXIPIME) IV 2 g (04/25/19 0655)  . vancomycin       LOS: 3 days   Time spent= 40 mins    Henya Aguallo Arsenio Loader, MD [facility]  If 7PM-7AM, please contact night-coverage  04/25/2019, 8:01 AM

## 2019-04-25 NOTE — NC FL2 (Signed)
[place] MEDICAID FL2 LEVEL OF CARE SCREENING TOOL     IDENTIFICATION  Patient Name: [name] Birthdate: Jun 11, 1940 Sex: female Admission Date (Current Location): 04/21/2019  [facility] and [place] Number:  Herbalist and Address:  [facility] , [place] [address] [place], [place] [address]      Provider Number: [id number]  Attending Physician Name and Address:  Damita Lack, MD  Relative Name and Phone Number:  [name] (daughter) [phone]    Current Level of Care: Hospital Recommended Level of Care: [place] Prior Approval Number:    Date Approved/Denied:   PASRR Number: [id number]  Discharge Plan: SNF    Current Diagnoses: Patient Active Problem List   Diagnosis Date Noted  . Sepsis secondary to UTI ([place]) 04/25/2019  . Cellulitis in diabetic foot ([place]) 04/25/2019  . Hypokalemia 04/21/2019  . Anticoagulated 06/25/2018  . Chronic pain 06/25/2018  . CRI (chronic renal insufficiency), stage 3 (moderate) 06/25/2018  . Drug-induced constipation 06/18/2018  . DVT (deep venous thrombosis) ([place]) 06/03/2018  . Syncope and collapse 06/02/2018  . Chest pain 06/02/2018  . Primary osteoarthritis of both knees 05/26/2018  . Body mass index (BMI) of 50-59.9 in adult ([place]) 05/26/2018  . Acquired hypothyroidism 05/26/2018  . Chronic bilateral low back pain without sciatica 02/17/2018  . Astigmatism with presbyopia, bilateral 03/26/2017  . Cortical age-related cataract of both eyes 03/26/2017  . Family history of glaucoma 03/26/2017  . Long term current use of oral hypoglycemic drug 03/26/2017  . Nuclear sclerotic cataract of both eyes 03/26/2017  . Pain in left hip 02/18/2017  . Type 2 diabetes mellitus ([place]) 01/16/2017  . Closed nondisplaced intertrochanteric fracture of left femur with delayed healing 01/14/2017  . Postoperative wound infection 01/14/2017  . Gout 11/29/2016  . Depression 11/29/2016  . Closed left  hip fracture ([place]) 11/29/2016  . GERD (gastroesophageal reflux disease) 11/29/2016  . Closed intertrochanteric fracture of hip, left, initial encounter ([place]) 11/29/2016  . Physical deconditioning 07/31/2015  . Pulmonary fibrosis, postinflammatory ([place]) 07/31/2015  . Bronchiectasis without complication ([place]) 38/18/2993  . Tendinitis of left rotator cuff 06/13/2015  . Acute on chronic diastolic heart failure ([place]) 04/21/2015  . Acute kidney injury superimposed on chronic kidney disease ([place]) 04/17/2015  . Hypotension 04/17/2015  . Chronic respiratory failure with hypoxia ([place]) 02/09/2015  . Dyspnea and respiratory abnormality 01/03/2015  . Normal coronary arteries 11/02/2014  . Morbid obesity ([place]) 10/30/2014  . Dysphagia 08/21/2014  . Chronic fatigue 06/21/2014  . DOE (dyspnea on exertion) 05/24/2014  . Primary osteoarthritis of left knee 04/26/2014  . High risk medication use 04/17/2014  . Aphasia 02/12/2014  . 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 ([place]) 01/18/2014  . Lumbar facet arthropathy 01/18/2014  . Bilateral edema of lower extremity 05/30/2013  . Chronic cough 05/24/2013  . ILD (interstitial lung disease) ([place]) 04/10/2013  . Greater trochanteric bursitis of left hip 08/13/2012  . Spinal stenosis of lumbar region with radiculopathy 07/12/2012  . Inability to walk 07/12/2012  . Asymptomatic bacteriuria 07/12/2012  . Meningioma of left sphenoid wing involving cavernous sinus ([place]) 02/17/2012  . Chest pain with moderate risk of acute coronary syndrome 01/28/2012  . Rheumatoid arthritis ([place]) 01/28/2012  . Primary osteoarthritis of right knee 01/28/2012  . Dyslipidemia   . Thyroid disease   . Essential hypertension     Orientation RESPIRATION BLADDER Height & Weight     Self, Time, Situation, Place  O2(nasal cannula 2L/min) Incontinent, External  catheter Weight: 275 lb 14.4 oz (125.1 kg) Height:  5' 4" (162.6 cm)  BEHAVIORAL SYMPTOMS/MOOD NEUROLOGICAL BOWEL  NUTRITION STATUS      Continent Diet(see discharge summary)  AMBULATORY STATUS COMMUNICATION OF NEEDS Skin   Limited Assist Verbally Other (Comment)(cellulitis legs, ecchymosis arm and abdomen, MASD buttocks and abdomen)                       Personal Care Assistance Level of Assistance  Bathing, Feeding, Dressing, Total care Bathing Assistance: Limited assistance Feeding assistance: Independent Dressing Assistance: Limited assistance Total Care Assistance: Limited assistance   Functional Limitations Info  Sight, Hearing, Speech Sight Info: Adequate Hearing Info: Adequate Speech Info: Adequate    SPECIAL CARE FACTORS FREQUENCY  PT (By licensed PT), OT (By licensed OT)     PT Frequency: min 5x weekly OT Frequency: min 5x weekly            Contractures Contractures Info: Not present    Additional Factors Info  Code Status, Allergies Code Status Info: full Allergies Info: Codeine, Infliximab, Lisinopril           Current Medications (04/25/2019):  This is the current hospital active medication list Current Facility-Administered Medications  Medication Dose Route Frequency Provider Last Rate Last Dose  . 0.9 %  sodium chloride infusion   Intravenous Continuous Adele Barthel D, MD 50 mL/hr at 04/24/19 1252    . acetaminophen (TYLENOL) tablet 1,000 mg  1,000 mg Oral Q6H PRN Shela Leff, MD   1,000 mg at 04/25/19 1535  . allopurinol (ZYLOPRIM) tablet 300 mg  300 mg Oral Daily Shela Leff, MD   300 mg at 04/25/19 0951  . aspirin tablet 325 mg  325 mg Oral Daily Donnamae Jude, RPH   325 mg at 04/25/19 1153  . atorvastatin (LIPITOR) tablet 10 mg  10 mg Oral QPM Shela Leff, MD   10 mg at 04/25/19 1632  . cefTRIAXone (ROCEPHIN) 2 g in sodium chloride 0.9 % 100 mL IVPB  2 g Intravenous Q24H Amin, Ankit Chirag, MD 200 mL/hr at 04/25/19 1537 2 g at 04/25/19 1537  . cholecalciferol (VITAMIN D3) tablet 2,000 Units  2,000 Units Oral Q lunch Shela Leff, MD   2,000 Units at 04/25/19 1153  . colchicine tablet 0.6 mg  0.6 mg Oral Daily Amin, Ankit Chirag, MD   0.6 mg at 04/25/19 1153  . DULoxetine (CYMBALTA) DR capsule 60 mg  60 mg Oral Daily Shela Leff, MD   60 mg at 04/25/19 0950  . fluticasone (FLONASE) 50 MCG/ACT nasal spray 2 spray  2 spray Each Nare Daily Shela Leff, MD   2 spray at 04/25/19 0949  . gabapentin (NEURONTIN) capsule 1,200 mg  1,200 mg Oral TID Shela Leff, MD   1,200 mg at 04/25/19 1534  . guaiFENesin (ROBITUSSIN) 100 MG/5ML solution 100 mg  5 mL Oral Q4H PRN Adele Barthel D, MD      . heparin injection 5,000 Units  5,000 Units Subcutaneous Q8H Adele Barthel D, MD   5,000 Units at 04/25/19 1303  . hydroxychloroquine (PLAQUENIL) tablet 400 mg  400 mg Oral Daily Shela Leff, MD   400 mg at 04/25/19 0950  . insulin aspart (novoLOG) injection 0-5 Units  0-5 Units Subcutaneous QHS Shela Leff, MD   1 Units at 04/22/19 2113  . insulin aspart (novoLOG) injection 0-9 Units  0-9 Units Subcutaneous TID WC Shela Leff, MD   2 Units at 04/25/19 1632  .  ipratropium-albuterol (DUONEB) 0.5-2.5 (3) MG/3ML nebulizer solution 3 mL  3 mL Nebulization Q6H PRN Shela Leff, MD      . leflunomide (ARAVA) tablet 10 mg  10 mg Oral Daily Shela Leff, MD   10 mg at 04/25/19 0949  . levothyroxine (SYNTHROID) tablet 50 mcg  50 mcg Oral QAC breakfast Shela Leff, MD   50 mcg at 04/25/19 0523  . lidocaine (LIDODERM) 5 % 1 patch  1 patch Transdermal Q24H Adele Barthel D, MD   1 patch at 04/25/19 1155  . loperamide (IMODIUM) capsule 4 mg  4 mg Oral Q6H PRN Damita Lack, MD   4 mg at 04/25/19 0950  . metoprolol tartrate (LOPRESSOR) tablet 12.5 mg  12.5 mg Oral BID Adele Barthel D, MD   12.5 mg at 04/25/19 0950  . morphine (MS CONTIN) 12 hr tablet 15 mg  15 mg Oral Q12H Amin, Ankit Chirag, MD   15 mg at 04/25/19 1153  . morphine 2 MG/ML injection 1 mg  1 mg  Intravenous Q4H PRN Adele Barthel D, MD      . multivitamin with minerals tablet 1 tablet  1 tablet Oral Daily Shela Leff, MD   1 tablet at 04/25/19 0950  . pantoprazole (PROTONIX) EC tablet 40 mg  40 mg Oral Daily Shela Leff, MD   40 mg at 04/25/19 0950  . predniSONE (DELTASONE) tablet 10 mg  10 mg Oral q morning - 10a Amin, Ankit Chirag, MD   10 mg at 04/25/19 1154  . psyllium (HYDROCIL/METAMUCIL) packet 1 packet  1 packet Oral TID Damita Lack, MD   1 packet at 04/25/19 1534  . sodium chloride (OCEAN) 0.65 % nasal spray 1 spray  1 spray Each Nare PRN Shela Leff, MD      . tiZANidine (ZANAFLEX) tablet 4 mg  4 mg Oral Q8H PRN Blain Pais, MD         Discharge Medications: Please see discharge summary for a list of discharge medications.  Relevant Imaging Results:  Relevant Lab Results:   Additional Information SSN: [id number]  Alberteen [name], LCSW

## 2019-04-25 NOTE — Progress Notes (Signed)
Patient  Provided     With   Flutter valve and uses it appropriately.

## 2019-04-25 NOTE — Plan of Care (Signed)
  Problem: Health Behavior/Discharge Planning: Goal: Ability to manage health-related needs will improve Outcome: Progressing   Problem: Clinical Measurements: Goal: Respiratory complications will improve Outcome: Progressing   Problem: Clinical Measurements: Goal: Cardiovascular complication will be avoided Outcome: Progressing   Problem: Activity: Goal: Risk for activity intolerance will decrease Outcome: Progressing   Problem: Coping: Goal: Level of anxiety will decrease Outcome: Progressing   Problem: Pain Managment: Goal: General experience of comfort will improve Outcome: Progressing   Problem: Safety: Goal: Ability to remain free from injury will improve Outcome: Progressing   Problem: Skin Integrity: Goal: Risk for impaired skin integrity will decrease Outcome: Progressing

## 2019-04-26 DIAGNOSIS — A419 Sepsis, unspecified organism: Secondary | ICD-10-CM | POA: Diagnosis not present

## 2019-04-26 DIAGNOSIS — N390 Urinary tract infection, site not specified: Secondary | ICD-10-CM | POA: Diagnosis not present

## 2019-04-26 LAB — BASIC METABOLIC PANEL
Anion gap: 9 (ref 5–15)
BUN: 5 mg/dL — ABNORMAL LOW (ref 8–23)
CO2: 22 mmol/L (ref 22–32)
Calcium: 8.2 mg/dL — ABNORMAL LOW (ref 8.9–10.3)
Chloride: 109 mmol/L (ref 98–111)
Creatinine, Ser: 0.78 mg/dL (ref 0.44–1.00)
GFR calc Af Amer: >60 mL/min (ref >60)
GFR calc non Af Amer: >60 mL/min (ref >60)
Glucose, Bld: 148 mg/dL — ABNORMAL HIGH (ref 70–99)
Potassium: 4.1 mmol/L (ref 3.5–5.1)
Sodium: 140 mmol/L (ref 135–145)

## 2019-04-26 LAB — GLUCOSE, CAPILLARY
Glucose-Capillary: 126 mg/dL — ABNORMAL HIGH (ref 70–99)
Glucose-Capillary: 145 mg/dL — ABNORMAL HIGH (ref 70–99)
Glucose-Capillary: 161 mg/dL — ABNORMAL HIGH (ref 70–99)
Glucose-Capillary: 183 mg/dL — ABNORMAL HIGH (ref 70–99)
Glucose-Capillary: 168 mg/dL — ABNORMAL HIGH (ref 70–99)

## 2019-04-26 LAB — CBC
HCT: 37.1 % (ref 36.0–46.0)
Hemoglobin: 11.5 g/dL — ABNORMAL LOW (ref 12.0–15.0)
MCH: 29.6 pg (ref 26.0–34.0)
MCHC: 31 g/dL (ref 30.0–36.0)
MCV: 95.4 fL (ref 80.0–100.0)
Platelets: 146 10*3/uL — ABNORMAL LOW (ref 150–400)
RBC: 3.89 MIL/uL (ref 3.87–5.11)
RDW: 16.3 % — ABNORMAL HIGH (ref 11.5–15.5)
WBC: 6.2 10*3/uL (ref 4.0–10.5)
nRBC: 0 % (ref 0.0–0.2)

## 2019-04-26 LAB — MAGNESIUM: Magnesium: 1.6 mg/dL — ABNORMAL LOW (ref 1.7–2.4)

## 2019-04-26 MED ORDER — MAGNESIUM SULFATE 2 GM/50ML IV SOLN
2.0000 g | Freq: Once | INTRAVENOUS | Status: AC
  Administered 2019-04-26: 2 g via INTRAVENOUS
  Filled 2019-04-26 (×2): qty 50

## 2019-04-26 MED ORDER — METOPROLOL TARTRATE 50 MG PO TABS
50.0000 mg | ORAL_TABLET | Freq: Two times a day (BID) | ORAL | Status: DC
  Administered 2019-04-26 – 2019-04-27 (×2): 50 mg via ORAL
  Filled 2019-04-26 (×2): qty 1

## 2019-04-26 NOTE — Care Management Important Message (Signed)
Important Message  Patient Details  Name: KIPPY MELENA MRN: [id number] Date of Birth: February 07, 1941   Medicare Important Message Given:  Yes     Shelda Altes 04/26/2019, 12:39 PM

## 2019-04-26 NOTE — Progress Notes (Addendum)
Physical Therapy Treatment Patient Details Name: [name] MRN: [id number] DOB: Apr 15, 1941 Today's Date: 04/26/2019    History of Present Illness Pt is a 78 yo female presenting with chest pain and SOB, determined to have cellulitis of BLE upon admission. Pt PMH is sig for CHF, CKD, asthma, COPD, ILD, depression, DM II, HLD, HTN, obesity, gout, prior DVT, and use of supplemental O2 at home at night.    PT Comments    Patient seen for mobility progression. Pt overall requires min guard assist for safety with OOB mobility. Pt tolerated short distance gait of 20 ft X 2 trials with seated break due to SOB. Pt now prefers to return home upon d/c. Will practice single step next session as pt has step to enter home and continue to progress as tolerated. Continue to recommend post acute rehab until pt is able to make more progress and demonstrate safety with stair training.    If pt does d/c home will benefit from [facility] services to progress to PLOF.    Follow Up Recommendations  SNF;Supervision/Assistance - 24 hour (Pt prefers to go home upon d/c)     Equipment Recommendations  (pt has needed equipment)    Recommendations for Other Services       Precautions / Restrictions Precautions Precautions: Fall    Mobility  Bed Mobility Overal bed mobility: Needs Assistance Bed Mobility: Supine to Sit;Sit to Supine     Supine to sit: HOB elevated;Supervision Sit to supine: Min assist   General bed mobility comments: increased time and effort to get into sitting EOB and supervision for safety; assist to bring L LE into bed and pt able to bring R LE up   Transfers Overall transfer level: Needs assistance Equipment used: Rolling walker (2 wheeled) Transfers: Sit to/from Stand Sit to Stand: Min guard         General transfer comment: min guard for safety; good technique demonstrated  Ambulation/Gait Ambulation/Gait assistance: Min guard Gait Distance (Feet): (20  ft X 2 trials ) Assistive device: Rolling walker (2 wheeled) Gait Pattern/deviations: Decreased stride length;Trunk flexed;Wide base of support;Step-through pattern;Decreased step length - right;Decreased step length - left Gait velocity: decreased   General Gait Details: cues for PLB; min guard for safety; seated rest break needed due to SOB and fatigued after ~20 ft; pt on 2L and SpO2 >90%   Stairs             Wheelchair Mobility    Modified Rankin (Stroke Patients Only)       Balance Overall balance assessment: Needs assistance Sitting-balance support: Feet supported;No upper extremity supported Sitting balance-Leahy Scale: Good     Standing balance support: Bilateral upper extremity supported;During functional activity Standing balance-Leahy Scale: Poor                              Cognition Arousal/Alertness: Awake/alert Behavior During Therapy: WFL for tasks assessed/performed Overall Cognitive Status: Within Functional Limits for tasks assessed                                        Exercises      General Comments        Pertinent Vitals/Pain Pain Assessment: Faces Faces Pain Scale: Hurts a little bit Pain Location: R hip and low back Pain Descriptors / Indicators: Guarding Pain Intervention(s):  Limited activity within patient's tolerance;Monitored during session;Repositioned    Home Living                      Prior Function            PT Goals (current goals can now be found in the care plan section) Progress towards PT goals: Progressing toward goals    Frequency    Min 3X/week      PT Plan Current plan remains appropriate    Co-evaluation              AM-PAC PT "6 Clicks" Mobility   Outcome Measure  Help needed turning from your back to your side while in a flat bed without using bedrails?: A Little Help needed moving from lying on your back to sitting on the side of a flat bed without  using bedrails?: A Little Help needed moving to and from a bed to a chair (including a wheelchair)?: A Little Help needed standing up from a chair using your arms (e.g., wheelchair or bedside chair)?: A Little Help needed to walk in hospital room?: A Little Help needed climbing 3-5 steps with a railing? : A Lot 6 Click Score: 17    End of Session Equipment Utilized During Treatment: Gait belt;Oxygen(2L O2) Activity Tolerance: Patient limited by fatigue Patient left: with call bell/phone within reach;in bed;with family/visitor present Nurse Communication: Mobility status PT Visit Diagnosis: Unsteadiness on feet (R26.81);Difficulty in walking, not elsewhere classified (R26.2)     Time: 8250-0370 PT Time Calculation (min) (ACUTE ONLY): 37 min  Charges:  $Gait Training: 8-22 mins $Therapeutic Activity: 8-22 mins                     Earney Navy, PTA Acute Rehabilitation Services Pager: [phone] Office: [phone]     Darliss Cheney 04/26/2019, 5:24 PM

## 2019-04-26 NOTE — Progress Notes (Signed)
Nurse spoke with daughter Lauro Regulus) on the phone regarding patients plan of care. All questions and concerns were answered.

## 2019-04-26 NOTE — Progress Notes (Signed)
PROGRESS NOTE    [name]  VQQ:[id number] DOB: November 24, 1940 DOA: 04/21/2019 PCP: [name], MD   Brief Narrative:  78 year old with history of diastolic CHF, CKD stage III, asthma, COPD, interstitial lung disease, chronic hypoxia on 2 L nasal cannula, depression, DM 2, HLD, HTN, hypothyroidism, morbid obesity with BMI greater than 40, history of DVT, rheumatoid arthritis, gout presented to the hospital with shortness of breath and chest pain.  Initially she was also hypotensive on admission.  Diagnosed with bilateral lower extremity cellulitis.   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  Fluid with caution. -Chest x-ray-negative -Continue IV Rocephin  Nonspecific diarrhea, improved -C. difficile-negative.  Monitor electrolytes, Imodium as needed.  Atypical chest pain/dyspnea, resolved -VQ scan and bilateral lower extremity Dopplers negative for DVT.  Diabetes mellitus type 2, non-insulin-dependent Chronic peripheral neuropathy secondary to DM 2 -Insulin sliding scale and Accu-Chek.  Hemoglobin A1c 7.4 -On gabapentin  AKI on CKD stage IIIa, resolved.  Creatinine today 0.8 -Admission creatinine 2.1.  Resolved with fluids. -Gentle hydration.  Monitor renal function and urine output.  ARB and Lasix on hold.  Will resume when appropriate  Chronic diastolic congestive heart failure Essential hypertension -No evidence of fluid overload.  Increase metoprolol 50  mg twice daily.  Low-dose Toprol-XL 100 mg. -Diuretic and ARB on hold  Interstitial lung disease with chronic hypoxia, 2 L nasal cannula -Stable.  As needed bronchodilators.  Prednisone 10 mg daily -I-S/flutter  Gout Rheumatoid arthritis -Allopurinol.  Plaquenil  Hypothyroidism -On Synthroid, TSH normal  GERD -PPI  Osteoarthritis -Pain control.  PT/OT-recommending skilled nursing facility  DVT prophylaxis: Subcu heparin Code Status: Full code Family Communication: Spoke with daughter at bedside yesterday Disposition Plan: Maintain hospital stay for ongoing care for sepsis and IV antibiotics.  Hopefully SNF in next 48 hours  Consultants:   None  Procedures:   None  Antimicrobials:   Rocephin   Subjective: Still having some loose bowel movements but feels a little better than yesterday.  Was able to ambulate a little with the help of walker.  Set out in the chair for about an hour.  Does not feel back to baseline yet  Review of Systems Otherwise negative except as per HPI, including: General = no fevers, chills, dizziness, malaise, fatigue HEENT/EYES = negative for pain, redness, loss of vision, double vision, blurred vision, loss of hearing, sore throat, hoarseness, dysphagia Cardiovascular= negative for chest pain, palpitation, murmurs, lower extremity swelling Respiratory/lungs= negative for shortness of breath, cough, hemoptysis, wheezing, mucus production Gastrointestinal= negative for nausea, vomiting,, abdominal pain, melena, hematemesis Genitourinary= negative for Dysuria, Hematuria, Change in Urinary Frequency MSK = Negative for arthralgia,  Neurology= Negative for headache, seizures, numbness, tingling  Psychiatry= Negative for anxiety, depression, suicidal and homocidal ideation Allergy/Immunology= Medication/Food allergy as listed  Skin= Negative for Rash, lesions, ulcers, itching   Objective: Vitals:   04/26/19 0122 04/26/19 0517 04/26/19 0732  04/26/19 1114  BP: 110/62 126/72 117/65 (!) 143/74  Pulse:   [name])  107 (!) 103  Resp:  _0 Temp:  98.3 F (36.8 C) 98.7 F (37.1 C) 98.1 F (36.7 C)  TempSrc:  Oral Oral Oral  SpO2:  91% 100% 95%  Weight:  128 kg    Height:        Intake/Output Summary (Last 24 hours) at 04/26/2019 1144 Last data filed at 04/26/2019 0913 Gross per 24 hour  Intake 1110 ml  Output 300 ml  Net 810 ml   Filed Weights   04/24/19 0500 04/25/19 0648 04/26/19 0517  Weight: 122.1 kg 125.1 kg 128 kg    Examination:  Constitutional: Not in acute distress, 2 L nasal cannula Respiratory: Minimal bibasilar rhonchi Cardiovascular: Sinus tachycardia, normal sinus rhythm, no rubs Abdomen: Nontender nondistended good bowel sounds Musculoskeletal: No edema noted Skin: Bilateral lower extremity erythema extending midway up to her knees Neurologic: CN 2-12 grossly intact.  And nonfocal Psychiatric: Normal judgment and insight. Alert and oriented x 3. Normal mood.     Data Reviewed:   CBC: Recent Labs  Lab 04/22/19 0546 04/23/19 0647 04/24/19 0522 04/25/19 0511 04/26/19 0417  WBC 6.3 5.5 4.8 5.4 6.2  HGB 11.6* 11.4* 11.4* 11.8* 11.5*  HCT 39.4 36.8 37.4 38.1 37.1  MCV 102.3* 96.3 97.4 96.0 95.4  PLT 145* 139* 125* 132* 656*   Basic Metabolic Panel: Recent Labs  Lab 04/22/19 0213 04/22/19 0546 04/23/19 0647 04/24/19 0522 04/24/19 1124 04/25/19 0511 04/26/19 0417  NA  --  140  --  139 139 141 140  K  --  3.2*  --  3.5 5.0 3.6 4.1  CL  --  106  --  111 112* 109 109  CO2  --  22  --  22 18* 22 22  GLUCOSE  --  162*  --  143* 171* 144* 148*  BUN  --  10  --  6* <5* 5* 5*  CREATININE  --  2.75*  --  0.89 0.77 0.82 0.78  CALCIUM  --  8.1*  --  7.8* 7.7* 8.1* 8.2*  MG 1.5*  --  1.7  --   --   --  1.6*   GFR: Estimated Creatinine Clearance: 78.1 mL/min (by C-G formula based on SCr of 0.78 mg/dL). Liver Function Tests: Recent Labs  Lab 04/21/19 1539  AST 33  ALT 36  ALKPHOS 94   BILITOT 0.6  PROT 5.5*  ALBUMIN 3.0*   No results for input(s): LIPASE, AMYLASE in the last 168 hours. No results for input(s): AMMONIA in the last 168 hours. Coagulation Profile: No results for input(s): INR, PROTIME in the last 168 hours. Cardiac Enzymes: No results for input(s): CKTOTAL, CKMB, CKMBINDEX, TROPONINI in the last 168 hours. BNP (last 3 results) No results for input(s): PROBNP in the last 8760 hours. HbA1C: No results for input(s): HGBA1C in the last 72 hours. CBG: Recent Labs  Lab 04/25/19 1136 04/25/19 1604 04/25/19 2125 04/26/19 0611 04/26/19 1111  GLUCAP 139* 172* 162* 126* 145*   Lipid Profile: No results for input(s): CHOL, HDL, LDLCALC, TRIG, CHOLHDL, LDLDIRECT in the last 72 hours. Thyroid Function Tests: No results for input(s): TSH, T4TOTAL, FREET4, T3FREE, THYROIDAB in the last 72 hours. Anemia Panel: No results for input(s): VITAMINB12, FOLATE, FERRITIN, TIBC, IRON, RETICCTPCT in the last 72 hours. Sepsis Labs: Recent Labs  Lab 04/22/19 1455 04/22/19 1802  LATICACIDVEN 1.2 1.9    Recent Results (from the past 240 hour(s))  SARS CORONAVIRUS 2 (TAT 6-24 HRS) Nasopharyngeal Nasopharyngeal  Swab     Status: None   Collection Time: 04/21/19 10:10 PM   Specimen: Nasopharyngeal Swab  Result Value Ref Range Status   SARS Coronavirus 2 NEGATIVE NEGATIVE Final    Comment: (NOTE) SARS-CoV-2 target nucleic acids are NOT DETECTED. The SARS-CoV-2 RNA is generally detectable in upper and lower respiratory specimens during the acute phase of infection. Negative results do not preclude SARS-CoV-2 infection, do not rule out co-infections with other pathogens, and should not be used as the sole basis for treatment or other patient management decisions. Negative results must be combined with clinical observations, patient history, and epidemiological information. The expected result is Negative. Fact Sheet for Patients:  SugarRoll.be Fact Sheet for Healthcare Providers: https://www.woods-mathews.com/ This test is not yet approved or cleared by the [place] FDA and  has been authorized for detection and/or diagnosis of SARS-CoV-2 by FDA under an Emergency Use Authorization (EUA). This EUA will remain  in effect (meaning this test can be used) for the duration of the COVID-19 declaration under Section 56 4(b)(1) of the Act, 21 U.S.C. section 360bbb-3(b)(1), unless the authorization is terminated or revoked sooner. Performed at [facility], [place] [address], [place], [place] [address]   Culture, blood (routine x 2)     Status: None (Preliminary result)   Collection Time: 04/22/19  2:15 PM   Specimen: BLOOD RIGHT ARM  Result Value Ref Range Status   Specimen Description BLOOD RIGHT ARM  Final   Special Requests   Final    BOTTLES DRAWN AEROBIC ONLY Blood Culture adequate volume   Culture   Final    NO GROWTH 4 DAYS Performed at [facility], [place] [address], [place], [place] [address]    Report Status PENDING  Incomplete  Culture, blood (routine x 2)     Status: None (Preliminary result)   Collection Time: 04/22/19  2:45 PM   Specimen: BLOOD LEFT ARM  Result Value Ref Range Status   Specimen Description BLOOD LEFT ARM  Final   Special Requests   Final    BOTTLES DRAWN AEROBIC ONLY Blood Culture adequate volume   Culture   Final    NO GROWTH 4 DAYS Performed at [facility], [place] [address] [place], [place] [address]    Report Status PENDING  Incomplete  Culture, Urine     Status: Abnormal   Collection Time: 04/22/19  4:11 PM   Specimen: Urine, Catheterized  Result Value Ref Range Status   Specimen Description URINE, CATHETERIZED  Final   Special Requests   Final    NONE Performed at [facility], [place] [address] [place], [place] [address]    Culture >=100,000 COLONIES/mL KLEBSIELLA PNEUMONIAE (A)  Final   Report Status  04/24/2019 FINAL  Final   Organism ID, Bacteria KLEBSIELLA PNEUMONIAE (A)  Final      Susceptibility   Klebsiella pneumoniae - MIC*    AMPICILLIN >=32 RESISTANT Resistant     CEFAZOLIN <=4 SENSITIVE Sensitive     CEFTRIAXONE <=1 SENSITIVE Sensitive     CIPROFLOXACIN <=0.25 SENSITIVE Sensitive     GENTAMICIN <=1 SENSITIVE Sensitive     IMIPENEM <=0.25 SENSITIVE Sensitive     NITROFURANTOIN 128 RESISTANT Resistant     TRIMETH/SULFA >=320 RESISTANT Resistant     AMPICILLIN/SULBACTAM 4 SENSITIVE Sensitive     PIP/TAZO <=4 SENSITIVE Sensitive     Extended ESBL NEGATIVE Sensitive     * >=100,000 COLONIES/mL KLEBSIELLA PNEUMONIAE  MRSA PCR  Screening     Status: None   Collection Time: 04/22/19 10:07 PM   Specimen: Nasopharyngeal  Result Value Ref Range Status   MRSA by PCR NEGATIVE NEGATIVE Final    Comment:        The GeneXpert MRSA Assay (FDA approved for NASAL specimens only), is one component of a comprehensive MRSA colonization surveillance program. It is not intended to diagnose MRSA infection nor to guide or monitor treatment for MRSA infections. Performed at [facility], [place] [address], [place], [place] [address]   C difficile quick scan w PCR reflex     Status: None   Collection Time: 04/24/19  5:14 PM   Specimen: STOOL  Result Value Ref Range Status   C Diff antigen NEGATIVE NEGATIVE Final   C Diff toxin NEGATIVE NEGATIVE Final   C Diff interpretation No C. difficile detected.  Final    Comment: Performed at [facility], [place] [address] [place], [place] [address]         Radiology Studies: No results found.      Scheduled Meds: . allopurinol  300 mg Oral Daily  . aspirin  325 mg Oral Daily  . atorvastatin  10 mg Oral QPM  . cholecalciferol  2,000 Units Oral Q lunch  . colchicine  0.6 mg Oral Daily  . DULoxetine  60 mg Oral Daily  . fluticasone  2 spray Each Nare Daily  . gabapentin  1,200 mg Oral TID  . heparin injection (subcutaneous)   5,000 Units Subcutaneous Q8H  . hydroxychloroquine  400 mg Oral Daily  . insulin aspart  0-5 Units Subcutaneous QHS  . insulin aspart  0-9 Units Subcutaneous TID WC  . leflunomide  10 mg Oral Daily  . levothyroxine  50 mcg Oral QAC breakfast  . lidocaine  1 patch Transdermal Q24H  . metoprolol tartrate  12.5 mg Oral BID  . morphine  15 mg Oral Q12H  . multivitamin with minerals  1 tablet Oral Daily  . pantoprazole  40 mg Oral Daily  . predniSONE  10 mg Oral q morning - 10a  . psyllium  1 packet Oral TID   Continuous Infusions: . sodium chloride 50 mL/hr at 04/26/19 0117  . cefTRIAXone (ROCEPHIN)  IV 2 g (04/25/19 1537)     LOS: 4 days   Time spent= 40 mins    Ankit Arsenio Loader, MD [facility]  If 7PM-7AM, please contact night-coverage  04/26/2019, 11:44 AM

## 2019-04-26 NOTE — TOC Initial Note (Signed)
Transition of Care [facility]) - Initial/Assessment Note    Patient Details  Name: [name] MRN: [id number] Date of Birth: [birth year]  Transition of Care [facility]) CM/SW Contact:    Zenon Mayo, RN Phone Number: 04/26/2019, 12:24 PM  Clinical Narrative:                 From home with spouse and grand son, she has a walker, shower chair  (3 n 1) and home oxygen she uses at night with [employer] (2 liters) .  NCM spoke with her about going to a SNF, she states she has thought about it and she has decided she would like to go home with [facility] services due to the covid in these facilities.NCM informed MD.  NCM offered choice , she states she has [facility] and would like to try them again.  Referral made to [place] with [facility] for [facility], HHPT, HHOT, HHAIDE and [place] Education officer, museum.  [place] states they can take the referral and soc will begin on Friday or Saturday.    Expected Discharge Plan: [place] Barriers to Discharge: No Barriers Identified   Patient Goals and CMS Choice Patient states their goals for this hospitalization and ongoing recovery are:: to get well CMS Medicare.gov Compare Post Acute Care list provided to:: Patient Choice offered to / list presented to : Patient  Expected Discharge Plan and Services Expected Discharge Plan: [place] In-house Referral: NA Discharge Planning Services: CM Consult Post Acute Care Choice: [place] arrangements for the past 2 months: Single Family Home                 DME Arranged: (NA)         HH Arranged: RN, PT, OT, Nurse's Aide, Social Work [employer] Agency: [facility] Date [place]: 04/26/19 Time [place]: 1223 Representative spoke with at [place]: [place]  Prior Living Arrangements/Services Living arrangements for the past 2 months: [place] with:: Spouse, Minor Children Patient language and need for interpreter reviewed:: Yes Do  you feel safe going back to the place where you live?: Yes      Need for Family Participation in Patient Care: Yes (Comment) Care giver support system in place?: Yes (comment) Current home services: DME(she has walker, shower chair (3 n 1), home oxygen with [employer] (2liters at night)) Criminal Activity/Legal Involvement Pertinent to Current Situation/Hospitalization: No - Comment as needed  Activities of Daily Living Home Assistive Devices/Equipment: Wheelchair, Environmental consultant (specify type), Grab bars in shower, Raised toilet seat with rails ADL Screening (condition at time of admission) Patient's cognitive ability adequate to safely complete daily activities?: Yes Is the patient deaf or have difficulty hearing?: No Does the patient have difficulty seeing, even when wearing glasses/contacts?: No Does the patient have difficulty concentrating, remembering, or making decisions?: No Patient able to express need for assistance with ADLs?: Yes Does the patient have difficulty dressing or bathing?: No Independently performs ADLs?: Yes (appropriate for developmental age) Does the patient have difficulty walking or climbing stairs?: Yes Weakness of Legs: Both Weakness of Arms/Hands: None  Permission Sought/Granted                  Emotional Assessment Appearance:: Appears stated age Attitude/Demeanor/Rapport: Engaged Affect (typically observed): Appropriate Orientation: : Oriented to Self, Oriented to Place, Oriented to  Time, Oriented to Situation Alcohol / Substance Use: Not Applicable Psych Involvement: No (comment)  Admission diagnosis:  Interstitial lung disease (HCC) [J84.9] AKI (acute kidney injury) ([place]) [N17.9] Chest pain, unspecified type [R07.9] Hypotension [I95.9] Patient Active Problem List   Diagnosis Date Noted  . Sepsis secondary to UTI ([place]) 04/25/2019  . Cellulitis in diabetic foot ([place]) 04/25/2019  . Hypokalemia 04/21/2019  . Anticoagulated 06/25/2018  . Chronic pain  06/25/2018  . CRI (chronic renal insufficiency), stage 3 (moderate) 06/25/2018  . Drug-induced constipation 06/18/2018  . DVT (deep venous thrombosis) ([place]) 06/03/2018  . Syncope and collapse 06/02/2018  . Chest pain 06/02/2018  . Primary osteoarthritis of both knees 05/26/2018  . Body mass index (BMI) of 50-59.9 in adult ([place]) 05/26/2018  . Acquired hypothyroidism 05/26/2018  . Chronic bilateral low back pain without sciatica 02/17/2018  . Astigmatism with presbyopia, bilateral 03/26/2017  . Cortical age-related cataract of both eyes 03/26/2017  . Family history of glaucoma 03/26/2017  . Long term current use of oral hypoglycemic drug 03/26/2017  . Nuclear sclerotic cataract of both eyes 03/26/2017  . Pain in left hip 02/18/2017  . Type 2 diabetes mellitus ([place]) 01/16/2017  . Closed nondisplaced intertrochanteric fracture of left femur with delayed healing 01/14/2017  . Postoperative wound infection 01/14/2017  . Gout 11/29/2016  . Depression 11/29/2016  . Closed left hip fracture ([place]) 11/29/2016  . GERD (gastroesophageal reflux disease) 11/29/2016  . Closed intertrochanteric fracture of hip, left, initial encounter ([place]) 11/29/2016  . Physical deconditioning 07/31/2015  . Pulmonary fibrosis, postinflammatory ([place]) 07/31/2015  . Bronchiectasis without complication ([place]) 85/92/9244  . Tendinitis of left rotator cuff 06/13/2015  . Acute on chronic diastolic heart failure ([place]) 04/21/2015  . Acute kidney injury superimposed on chronic kidney disease ([place]) 04/17/2015  . Hypotension 04/17/2015  . Chronic respiratory failure with hypoxia ([place]) 02/09/2015  . Dyspnea and respiratory abnormality 01/03/2015  . Normal coronary arteries 11/02/2014  . Morbid obesity ([place]) 10/30/2014  . Dysphagia 08/21/2014  . Chronic fatigue 06/21/2014  . DOE (dyspnea on exertion) 05/24/2014  . Primary osteoarthritis of left knee 04/26/2014  . High risk medication use 04/17/2014  . Aphasia 02/12/2014  .  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 ([place]) 01/18/2014  . Lumbar facet arthropathy 01/18/2014  . Bilateral edema of lower extremity 05/30/2013  . Chronic cough 05/24/2013  . ILD (interstitial lung disease) ([place]) 04/10/2013  . Greater trochanteric bursitis of left hip 08/13/2012  . Spinal stenosis of lumbar region with radiculopathy 07/12/2012  . Inability to walk 07/12/2012  . Asymptomatic bacteriuria 07/12/2012  . Meningioma of left sphenoid wing involving cavernous sinus ([place]) 02/17/2012  . Chest pain with moderate risk of acute coronary syndrome 01/28/2012  . Rheumatoid arthritis ([place]) 01/28/2012  . Primary osteoarthritis of right knee 01/28/2012  . Dyslipidemia   . Thyroid disease   . Essential hypertension    PCP:  [name], MD Pharmacy:   [facility], [address] [place] [address] Phone: [phone] Fax: [phone]     Social Determinants of Health (SDOH) Interventions    Readmission Risk Interventions Readmission Risk Prevention Plan 04/26/2019  Transportation Screening Complete  PCP or Specialist Appt within 3-5 Days Complete  HRI or [place] Complete  Social Work Consult for [place] Planning/Counseling Complete  Palliative Care Screening Not Applicable  Medication Review Press photographer) Complete  Some recent data might be hidden

## 2019-04-27 ENCOUNTER — Inpatient Hospital Stay (HOSPITAL_COMMUNITY): Payer: Medicare Other

## 2019-04-27 DIAGNOSIS — A419 Sepsis, unspecified organism: Secondary | ICD-10-CM | POA: Diagnosis not present

## 2019-04-27 DIAGNOSIS — N390 Urinary tract infection, site not specified: Secondary | ICD-10-CM | POA: Diagnosis not present

## 2019-04-27 LAB — CULTURE, BLOOD (ROUTINE X 2)
Culture: NO GROWTH
Culture: NO GROWTH
Special Requests: ADEQUATE
Special Requests: ADEQUATE

## 2019-04-27 LAB — CBC
HCT: 37 % (ref 36.0–46.0)
Hemoglobin: 11.5 g/dL — ABNORMAL LOW (ref 12.0–15.0)
MCH: 29.3 pg (ref 26.0–34.0)
MCHC: 31.1 g/dL (ref 30.0–36.0)
MCV: 94.4 fL (ref 80.0–100.0)
Platelets: 156 10*3/uL (ref 150–400)
RBC: 3.92 MIL/uL (ref 3.87–5.11)
RDW: 16.6 % — ABNORMAL HIGH (ref 11.5–15.5)
WBC: 6.5 10*3/uL (ref 4.0–10.5)
nRBC: 0 % (ref 0.0–0.2)

## 2019-04-27 LAB — BASIC METABOLIC PANEL
Anion gap: 10 (ref 5–15)
BUN: 5 mg/dL — ABNORMAL LOW (ref 8–23)
CO2: 22 mmol/L (ref 22–32)
Calcium: 8.3 mg/dL — ABNORMAL LOW (ref 8.9–10.3)
Chloride: 107 mmol/L (ref 98–111)
Creatinine, Ser: 0.88 mg/dL (ref 0.44–1.00)
GFR calc Af Amer: >60 mL/min (ref >60)
GFR calc non Af Amer: >60 mL/min (ref >60)
Glucose, Bld: 135 mg/dL — ABNORMAL HIGH (ref 70–99)
Potassium: 4.2 mmol/L (ref 3.5–5.1)
Sodium: 139 mmol/L (ref 135–145)

## 2019-04-27 LAB — PROCALCITONIN: Procalcitonin: 79.11 ng/mL

## 2019-04-27 LAB — GLUCOSE, CAPILLARY
Glucose-Capillary: 119 mg/dL — ABNORMAL HIGH (ref 70–99)
Glucose-Capillary: 148 mg/dL — ABNORMAL HIGH (ref 70–99)
Glucose-Capillary: 166 mg/dL — ABNORMAL HIGH (ref 70–99)
Glucose-Capillary: 145 mg/dL — ABNORMAL HIGH (ref 70–99)

## 2019-04-27 LAB — BRAIN NATRIURETIC PEPTIDE: B Natriuretic Peptide: 25.9 pg/mL (ref 0.0–100.0)

## 2019-04-27 LAB — MAGNESIUM: Magnesium: 1.9 mg/dL (ref 1.7–2.4)

## 2019-04-27 MED ORDER — DIPHENHYDRAMINE HCL 12.5 MG/5ML PO ELIX
12.5000 mg | ORAL_SOLUTION | Freq: Once | ORAL | Status: DC
  Filled 2019-04-27: qty 5

## 2019-04-27 MED ORDER — GUAIFENESIN ER 600 MG PO TB12
600.0000 mg | ORAL_TABLET | Freq: Two times a day (BID) | ORAL | Status: DC | PRN
  Administered 2019-04-27: 600 mg via ORAL
  Filled 2019-04-27: qty 1

## 2019-04-27 MED ORDER — DIPHENHYDRAMINE HCL 12.5 MG/5ML PO ELIX
12.5000 mg | ORAL_SOLUTION | Freq: Three times a day (TID) | ORAL | Status: DC | PRN
  Administered 2019-04-27: 12.5 mg via ORAL
  Filled 2019-04-27 (×2): qty 5

## 2019-04-27 MED ORDER — METOPROLOL SUCCINATE ER 100 MG PO TB24
100.0000 mg | ORAL_TABLET | Freq: Every day | ORAL | Status: DC
  Administered 2019-04-28: 100 mg via ORAL
  Filled 2019-04-27: qty 1

## 2019-04-27 MED ORDER — METOPROLOL TARTRATE 100 MG PO TABS
100.0000 mg | ORAL_TABLET | Freq: Once | ORAL | Status: AC
  Administered 2019-04-27: 100 mg via ORAL
  Filled 2019-04-27: qty 1

## 2019-04-27 MED ORDER — CEFAZOLIN SODIUM-DEXTROSE 2-4 GM/100ML-% IV SOLN
2.0000 g | Freq: Three times a day (TID) | INTRAVENOUS | Status: DC
  Administered 2019-04-27 – 2019-04-28 (×3): 2 g via INTRAVENOUS
  Filled 2019-04-27 (×6): qty 100

## 2019-04-27 NOTE — TOC Transition Note (Addendum)
Transition of Care [facility]) - CM/SW Discharge Note   Patient Details  Name: [name] MRN: [id number] Date of Birth: [birth year]  Transition of Care [facility]) CM/SW Contact:  Zenon Mayo, RN Phone Number: 04/27/2019, 10:11 AM   Clinical Narrative:    Patient states she is interested in the [facility] where she would get more [facility] services.  NCM asked [place] with [facility] if patient is a candidate for Home First program , she confirmed that patient is . NCM made referral to [facility] , he will be contacting patient shortly.  NCM informed [place] with [facility] that patient is interested in Home First program.  Tommi Rumps has spoken with patient and we are good to go with Home First program.   Final next level of care: [place] Barriers to Discharge: No Barriers Identified   Patient Goals and CMS Choice Patient states their goals for this hospitalization and ongoing recovery are:: to get well CMS Medicare.gov Compare Post Acute Care list provided to:: Patient Choice offered to / list presented to : Patient  Discharge Placement                       Discharge Plan and Services In-house Referral: NA Discharge Planning Services: CM Consult Post Acute Care Choice: Home Health          DME Arranged: (NA)         HH Arranged: RN, PT, OT, Nurse's Aide, Social Work [employer] Agency: [place] Date [facility] Agency Contacted: 04/27/19 Time [place]: 1011 Representative spoke with at [place]: [place] ([place]) Interventions     Readmission Risk Interventions Readmission Risk Prevention Plan 04/26/2019  Transportation Screening Complete  PCP or Specialist Appt within 3-5 Days Complete  HRI or [place] Complete  Social Work Consult for [place] Planning/Counseling [place] Screening Not Applicable  Medication Review Press photographer) Complete  Some recent data might be hidden

## 2019-04-27 NOTE — Consult Note (Signed)
[facility] CM Inpatient Consult   04/27/2019  [name] 30-May-1940 [id number]   Referral:  [facility] RNCM  Thank you for your referral request and this patient is eligible for [facility] Care Management program.   Chart review reveals is a 78 year old with HX of Heart Failure admitted with Sepsis secondary to UTI per MD notes.  Therapy is recommending skilled nursing facility for short term rehab.  Patient is eligible for [facility] Care Management programs such as Home First. Patient noted as high risk score for unplanned readmission score of 29%.    Primary Care Provider is Grier Mitts, MD of [facility] Primary [facility], this office is listed to provide the transition of care follow up.  Plan:  Follow up with inpatient [facility] team on referral plan.  [facility] Care Management does note replace or interfere with services needed and/or arranged by the inpatient [facility] team.   For questions contact:   Natividad Brood, RN BSN [facility] Liaison  [phone] business mobile phone Toll free office [phone]  Fax number: [phone] [email or web address]

## 2019-04-27 NOTE — Plan of Care (Signed)
  Problem: Education: Goal: Knowledge of General Education information will improve Description: Including pain rating scale, medication(s)/side effects and non-pharmacologic comfort measures Outcome: Progressing   Problem: Pain Managment: Goal: General experience of comfort will improve Outcome: Progressing   Problem: Skin Integrity: Goal: Risk for impaired skin integrity will decrease Outcome: Progressing   Problem: Safety: Goal: Ability to remain free from injury will improve Outcome: Progressing

## 2019-04-27 NOTE — Progress Notes (Signed)
PROGRESS NOTE    [name]  YQM:[id number] DOB: 09/17/1940 DOA: 04/21/2019 PCP: [name], MD   Brief Narrative:  78 year old with history of diastolic CHF, CKD stage III, asthma, COPD, interstitial lung disease, chronic hypoxia on 2 L nasal cannula, depression, DM 2, HLD, HTN, hypothyroidism, morbid obesity with BMI greater than 40, history of DVT, rheumatoid arthritis, gout presented to the hospital with shortness of breath and chest pain.  Initially she was also hypotensive on admission.  Diagnosed with bilateral lower extremity cellulitis.   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  Improved physiology Bilateral lower extremity cellulitis, nonpurulent, minimal improvement Urinary tract infection without hematuria, Klebsiella, greatly improved -Culture data reviewed.  Fluid with caution.  Chest x-ray is negative -Procalcitonin quite elevated.  BNP-negative -Transition Rocephin to Ancef, planned total 10-day of course  Nonspecific diarrhea, improved -C. difficile-negative.  Monitor electrolytes, Imodium as needed.  Atypical chest pain/dyspnea, resolved -VQ scan and bilateral lower extremity Dopplers negative for DVT.  Diabetes mellitus type 2, non-insulin-dependent Chronic peripheral neuropathy secondary to DM 2 -Insulin sliding scale and Accu-Chek.  Hemoglobin A1c 7.4 -Continue gabapentin  AKI on CKD stage IIIa, resolved.  Creatinine 0.8 -Admission creatinine 2.1.  Resolved with fluids. -Gentle hydration.  Monitor renal function and urine output.  ARB and Lasix on hold.  Will  resume when appropriate  Chronic diastolic congestive heart failure Essential hypertension -No evidence of fluid overload.  Metoprolol 100 mg p.o. tonight, Toprol-XL 100 mg starting tomorrow -Diuretic and ARB on hold  Interstitial lung disease with chronic hypoxia, 2 L nasal cannula -Stable.  As needed bronchodilators.  Prednisone 10 mg daily -I-S/flutter  Gout Rheumatoid arthritis -Allopurinol.  Plaquenil  Hypothyroidism -On Synthroid, TSH normal  GERD -PPI  Osteoarthritis -Pain control.  PT/OT-recommending skilled nursing facility but patient would rather go home with home health.  Home health orders placed  DVT prophylaxis: Subcu heparin Code Status: Full code Family Communication: None at bedside Disposition Plan: Maintain hospital stay for 24 hours - 48 hours.  Hopefully we can discharge her by Friday.  She still dyspneic on exertion with minimal improvement bilateral lower extremity erythema requiring IV antibiotics  Consultants:   None  Procedures:   None  Antimicrobials:   Ancef   Subjective: Still has some exertional shortness of breath.  Was able to sit in the chair for about couple hours unable to ambulate minimally with a walker in her room.  Does not feel back to her baseline.  Remains afebrile.  Still has bilateral lower extremity erythema  Feels slightly better than yesterday making good progress.  Would not want to go to skilled nursing facility prefers home health.  Review of Systems Otherwise negative except as per HPI, including: General = no fevers, chills, dizziness, malaise, fatigue HEENT/EYES = negative for pain, redness, loss of vision, double vision, blurred vision, loss of hearing, sore throat, hoarseness, dysphagia Cardiovascular= negative for chest pain, palpitation, murmurs, lower extremity swelling Respiratory/lungs= negative for shortness of breath, cough, hemoptysis, wheezing, mucus production Gastrointestinal= negative for  nausea, vomiting,, abdominal pain,  melena, hematemesis Genitourinary= negative for Dysuria, Hematuria, Change in Urinary Frequency MSK = Negative for arthralgia, myalgias, Back Pain, Joint swelling  Neurology= Negative for headache, seizures, numbness, tingling  Psychiatry= Negative for anxiety, depression, suicidal and homocidal ideation Allergy/Immunology= Medication/Food allergy as listed  Skin= bilateral lower extremity erythema   Objective: Vitals:   04/26/19 2005 04/27/19 0436 04/27/19 0456 04/27/19 0829  BP: 138/75 110/77  129/72  Pulse: (!) 104 95  100  Resp: _0 Temp: 98.5 F (36.9 C) 98.7 F (37.1 C)  98.2 F (36.8 C)  TempSrc: Oral Oral  Oral  SpO2: 99% 97%  93%  Weight:  131.1 kg 129 kg   Height:        Intake/Output Summary (Last 24 hours) at 04/27/2019 1054 Last data filed at 04/27/2019 0805 Gross per 24 hour  Intake 1691.54 ml  Output 330 ml  Net 1361.54 ml   Filed Weights   04/26/19 0517 04/27/19 0436 04/27/19 0456  Weight: 128 kg 131.1 kg 129 kg    Examination:  Constitutional: On 2 L nasal cannula, not in acute distress. Respiratory: Some bibasilar crackles Cardiovascular: Normal sinus rhythm, no rubs Abdomen: Nontender nondistended good bowel sounds Musculoskeletal: No edema noted Skin: Bilateral lower extremity erythema extending up midway up to her knees Neurologic: CN 2-12 grossly intact.  And nonfocal Psychiatric: Normal judgment and insight. Alert and oriented x 3. Normal mood.   Data Reviewed:   CBC: Recent Labs  Lab 04/23/19 0647 04/24/19 0522 04/25/19 0511 04/26/19 0417 04/27/19 0336  WBC 5.5 4.8 5.4 6.2 6.5  HGB 11.4* 11.4* 11.8* 11.5* 11.5*  HCT 36.8 37.4 38.1 37.1 37.0  MCV 96.3 97.4 96.0 95.4 94.4  PLT 139* 125* 132* 146* 622   Basic Metabolic Panel: Recent Labs  Lab 04/22/19 0213  04/23/19 0647 04/24/19 0522 04/24/19 1124 04/25/19 0511 04/26/19 0417 04/27/19 0336  NA  --    < >  --  139 139 141 140 139  K   --    < >  --  3.5 5.0 3.6 4.1 4.2  CL  --    < >  --  111 112* 109 109 107  CO2  --    < >  --  22 18* _1 GLUCOSE  --    < >  --  143* 171* 144* 148* 135*  BUN  --    < >  --  6* <5* 5* 5* 5*  CREATININE  --    < >  --  0.89 0.77 0.82 0.78 0.88  CALCIUM  --    < >  --  7.8* 7.7* 8.1* 8.2* 8.3*  MG 1.5*  --  1.7  --   --   --  1.6* 1.9   < > = values in this interval not displayed.   GFR: Estimated Creatinine Clearance: 71.3 mL/min (by C-G formula based on SCr of 0.88 mg/dL). Liver Function Tests: Recent Labs  Lab 04/21/19 1539  AST 33  ALT 36  ALKPHOS 94  BILITOT 0.6  PROT 5.5*  ALBUMIN 3.0*   No results for input(s): LIPASE, AMYLASE in the last 168 hours. No results for input(s): AMMONIA in the last 168 hours. Coagulation Profile: No results for input(s): INR, PROTIME in the last 168 hours. Cardiac Enzymes: No results for input(s): CKTOTAL, CKMB, CKMBINDEX, TROPONINI in the last 168 hours. BNP (last 3 results) No results for input(s): PROBNP in the last 8760 hours. HbA1C:  No results for input(s): HGBA1C in the last 72 hours. CBG: Recent Labs  Lab 04/26/19 1111 04/26/19 1625 04/26/19 1804 04/26/19 2117 04/27/19 0550  GLUCAP 145* 161* 183* 168* 119*   Lipid Profile: No results for input(s): CHOL, HDL, LDLCALC, TRIG, CHOLHDL, LDLDIRECT in the last 72 hours. Thyroid Function Tests: No results for input(s): TSH, T4TOTAL, FREET4, T3FREE, THYROIDAB in the last 72 hours. Anemia Panel: No results for input(s): VITAMINB12, FOLATE, FERRITIN, TIBC, IRON, RETICCTPCT in the last 72 hours. Sepsis Labs: Recent Labs  Lab 04/22/19 1455 04/22/19 1802 04/27/19 0336  PROCALCITON  --   --  79.11  LATICACIDVEN 1.2 1.9  --     Recent Results (from the past 240 hour(s))  SARS CORONAVIRUS 2 (TAT 6-24 HRS) Nasopharyngeal Nasopharyngeal Swab     Status: None   Collection Time: 04/21/19 10:10 PM   Specimen: Nasopharyngeal Swab  Result Value Ref Range Status   SARS  Coronavirus 2 NEGATIVE NEGATIVE Final    Comment: (NOTE) SARS-CoV-2 target nucleic acids are NOT DETECTED. The SARS-CoV-2 RNA is generally detectable in upper and lower respiratory specimens during the acute phase of infection. Negative results do not preclude SARS-CoV-2 infection, do not rule out co-infections with other pathogens, and should not be used as the sole basis for treatment or other patient management decisions. Negative results must be combined with clinical observations, patient history, and epidemiological information. The expected result is Negative. Fact Sheet for Patients: SugarRoll.be Fact Sheet for Healthcare Providers: https://www.woods-mathews.com/ This test is not yet approved or cleared by the [place] FDA and  has been authorized for detection and/or diagnosis of SARS-CoV-2 by FDA under an Emergency Use Authorization (EUA). This EUA will remain  in effect (meaning this test can be used) for the duration of the COVID-19 declaration under Section 56 4(b)(1) of the Act, 21 U.S.C. section 360bbb-3(b)(1), unless the authorization is terminated or revoked sooner. Performed at [facility], [place] [address], [place], [place] [address]   Culture, blood (routine x 2)     Status: None (Preliminary result)   Collection Time: 04/22/19  2:15 PM   Specimen: BLOOD RIGHT ARM  Result Value Ref Range Status   Specimen Description BLOOD RIGHT ARM  Final   Special Requests   Final    BOTTLES DRAWN AEROBIC ONLY Blood Culture adequate volume   Culture   Final    NO GROWTH 4 DAYS Performed at [facility], [place] [address], [place], [place] [address]    Report Status PENDING  Incomplete  Culture, blood (routine x 2)     Status: None (Preliminary result)   Collection Time: 04/22/19  2:45 PM   Specimen: BLOOD LEFT ARM  Result Value Ref Range Status   Specimen Description BLOOD LEFT ARM  Final   Special Requests   Final     BOTTLES DRAWN AEROBIC ONLY Blood Culture adequate volume   Culture   Final    NO GROWTH 4 DAYS Performed at [facility], [place] [address], [place], [place] [address]    Report Status PENDING  Incomplete  Culture, Urine     Status: Abnormal   Collection Time: 04/22/19  4:11 PM   Specimen: Urine, Catheterized  Result Value Ref Range Status   Specimen Description URINE, CATHETERIZED  Final   Special Requests   Final    NONE Performed at [facility], [place] [address], [place], [place] [address]    Culture >=100,000 COLONIES/mL KLEBSIELLA PNEUMONIAE (  A)  Final   Report Status 04/24/2019 FINAL  Final   Organism ID, Bacteria KLEBSIELLA PNEUMONIAE (A)  Final      Susceptibility   Klebsiella pneumoniae - MIC*    AMPICILLIN >=32 RESISTANT Resistant     CEFAZOLIN <=4 SENSITIVE Sensitive     CEFTRIAXONE <=1 SENSITIVE Sensitive     CIPROFLOXACIN <=0.25 SENSITIVE Sensitive     GENTAMICIN <=1 SENSITIVE Sensitive     IMIPENEM <=0.25 SENSITIVE Sensitive     NITROFURANTOIN 128 RESISTANT Resistant     TRIMETH/SULFA >=320 RESISTANT Resistant     AMPICILLIN/SULBACTAM 4 SENSITIVE Sensitive     PIP/TAZO <=4 SENSITIVE Sensitive     Extended ESBL NEGATIVE Sensitive     * >=100,000 COLONIES/mL KLEBSIELLA PNEUMONIAE  MRSA PCR Screening     Status: None   Collection Time: 04/22/19 10:07 PM   Specimen: Nasopharyngeal  Result Value Ref Range Status   MRSA by PCR NEGATIVE NEGATIVE Final    Comment:        The GeneXpert MRSA Assay (FDA approved for NASAL specimens only), is one component of a comprehensive MRSA colonization surveillance program. It is not intended to diagnose MRSA infection nor to guide or monitor treatment for MRSA infections. Performed at [facility], [place] [address], [place], [place] [address]   C difficile quick scan w PCR reflex     Status: None   Collection Time: 04/24/19  5:14 PM   Specimen: STOOL  Result Value Ref Range Status   C Diff antigen NEGATIVE  NEGATIVE Final   C Diff toxin NEGATIVE NEGATIVE Final   C Diff interpretation No C. difficile detected.  Final    Comment: Performed at [facility], [place] [address] [place],  [address]         Radiology Studies: Dg Chest Port 1 View  Result Date: 04/27/2019 CLINICAL DATA:  78 year old with shortness of breath. EXAM: PORTABLE CHEST 1 VIEW COMPARISON:  04/21/2019 and earlier, including CT chest of that same date. FINDINGS: Cardiac silhouette upper normal in size for AP portable technique, unchanged. Bronchiectasis involving the lower lobes as noted on the recent CT, unchanged. Mildly prominent bronchovascular markings diffusely and mild central peribronchial thickening, unchanged over multiple prior examinations. No new pulmonary parenchymal abnormalities. IMPRESSION: 1. Stable bronchiectasis involving the lower lobes as noted on the recent CT. No acute cardiopulmonary disease. 2. Stable mild changes of chronic bronchitis and/or asthma. Electronically Signed   By: Evangeline Dakin M.D.   On: 04/27/2019 08:25        Scheduled Meds: . allopurinol  300 mg Oral Daily  . aspirin  325 mg Oral Daily  . atorvastatin  10 mg Oral QPM  . cholecalciferol  2,000 Units Oral Q lunch  . colchicine  0.6 mg Oral Daily  . diphenhydrAMINE  12.5 mg Oral Once  . DULoxetine  60 mg Oral Daily  . fluticasone  2 spray Each Nare Daily  . gabapentin  1,200 mg Oral TID  . heparin injection (subcutaneous)  5,000 Units Subcutaneous Q8H  . hydroxychloroquine  400 mg Oral Daily  . insulin aspart  0-5 Units Subcutaneous QHS  . insulin aspart  0-9 Units Subcutaneous TID WC  . leflunomide  10 mg Oral Daily  . levothyroxine  50 mcg Oral QAC breakfast  . lidocaine  1 patch Transdermal Q24H  . metoprolol tartrate  50 mg Oral BID  . morphine  15 mg Oral Q12H  . multivitamin with minerals  1  tablet Oral Daily  . pantoprazole  40 mg Oral Daily  . predniSONE  10 mg Oral q morning - 10a   Continuous  Infusions: . sodium chloride 50 mL/hr at 04/26/19 2252  .  ceFAZolin (ANCEF) IV       LOS: 5 days   Time spent= 40 mins    Juvia Aerts Arsenio Loader, MD [facility]  If 7PM-7AM, please contact night-coverage  04/27/2019, 10:54 AM

## 2019-04-27 NOTE — Plan of Care (Signed)
  Problem: Education: Goal: Knowledge of General Education information will improve Description: Including pain rating scale, medication(s)/side effects and non-pharmacologic comfort measures Outcome: Progressing   Problem: Health Behavior/Discharge Planning: Goal: Ability to manage health-related needs will improve Outcome: Progressing   Problem: Pain Managment: Goal: General experience of comfort will improve Outcome: Progressing   Problem: Safety: Goal: Ability to remain free from injury will improve Outcome: Progressing   Problem: Skin Integrity: Goal: Risk for impaired skin integrity will decrease Outcome: Progressing

## 2019-04-28 ENCOUNTER — Telehealth: Payer: Self-pay | Admitting: Family Medicine

## 2019-04-28 DIAGNOSIS — N390 Urinary tract infection, site not specified: Secondary | ICD-10-CM | POA: Diagnosis not present

## 2019-04-28 DIAGNOSIS — A419 Sepsis, unspecified organism: Secondary | ICD-10-CM | POA: Diagnosis not present

## 2019-04-28 LAB — BASIC METABOLIC PANEL
Anion gap: 8 (ref 5–15)
BUN: 5 mg/dL — ABNORMAL LOW (ref 8–23)
CO2: 25 mmol/L (ref 22–32)
Calcium: 8 mg/dL — ABNORMAL LOW (ref 8.9–10.3)
Chloride: 107 mmol/L (ref 98–111)
Creatinine, Ser: 0.92 mg/dL (ref 0.44–1.00)
GFR calc Af Amer: >60 mL/min (ref >60)
GFR calc non Af Amer: >60 mL/min (ref >60)
Glucose, Bld: 112 mg/dL — ABNORMAL HIGH (ref 70–99)
Potassium: 4 mmol/L (ref 3.5–5.1)
Sodium: 140 mmol/L (ref 135–145)

## 2019-04-28 LAB — CBC
HCT: 35.7 % — ABNORMAL LOW (ref 36.0–46.0)
Hemoglobin: 11.2 g/dL — ABNORMAL LOW (ref 12.0–15.0)
MCH: 29.7 pg (ref 26.0–34.0)
MCHC: 31.4 g/dL (ref 30.0–36.0)
MCV: 94.7 fL (ref 80.0–100.0)
Platelets: 153 10*3/uL (ref 150–400)
RBC: 3.77 MIL/uL — ABNORMAL LOW (ref 3.87–5.11)
RDW: 16.9 % — ABNORMAL HIGH (ref 11.5–15.5)
WBC: 5.8 10*3/uL (ref 4.0–10.5)
nRBC: 0 % (ref 0.0–0.2)

## 2019-04-28 LAB — PROCALCITONIN: Procalcitonin: 44.99 ng/mL

## 2019-04-28 LAB — GLUCOSE, CAPILLARY: Glucose-Capillary: 117 mg/dL — ABNORMAL HIGH (ref 70–99)

## 2019-04-28 LAB — MAGNESIUM: Magnesium: 1.8 mg/dL (ref 1.7–2.4)

## 2019-04-28 MED ORDER — CEPHALEXIN 500 MG PO CAPS: 500.0000 mg | ORAL_CAPSULE | Freq: Four times a day (QID) | ORAL | 0 refills | Status: AC

## 2019-04-28 NOTE — Discharge Summary (Signed)
Physician Discharge Summary  [name] XMI:[id number] DOB: Jul 29, 1940 DOA: 04/21/2019  PCP: [name], MD  Admit date: 04/21/2019 Discharge date: 04/28/2019  Admitted From: Home Disposition: Home with home health  Recommendations for Outpatient Follow-up:  1. Follow up with PCP in 1-2 weeks 2. Please obtain BMP/CBC in one week your next doctors visit.  3. Keflex for 4 more days  : Discharge Condition: Stable CODE STATUS: Full  Diet recommendation: Diabetic  Brief/Interim Summary: 78 year old with history of diastolic CHF, CKD stage III, asthma, COPD, interstitial lung disease, chronic hypoxia on 2 L nasal cannula, depression, DM 2, HLD, HTN, hypothyroidism, morbid obesity with BMI greater than 40, history of DVT, rheumatoid arthritis, gout presented to the hospital with shortness of breath and chest pain.  Initially she was also hypotensive on admission.  Diagnosed with bilateral lower extremity cellulitis.  Urine cultures eventually growing Klebsiella which was pansensitive.  Her antibiotics were transitioned from Rocephin to Ancef to complete total of 10-day course.  Over the course of several days her symptoms significantly improved and her oxygen saturation was greater than 95% on 2 L nasal cannula.  She worked with physical therapy who recommended home health therefore arrangements were made.  VQ scan and bilateral lower extremity Dopplers were performed which were negative as well.  Today she has reached maximum benefit from hospital stay and stable for discharge with outpatient follow-up with recommendations as stated above.   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  Improved physiology Bilateral lower extremity cellulitis, nonpurulent, minimal improvement Urinary tract infection without hematuria, Klebsiella, greatly improved -Sepsis physiology has resolved.  Procalcitonin is trending down.  Chest x-ray is negative.  She is saturating well and feels back at her baseline.  She would like to go home therefore we will transition her from Ancef to 4 more days of oral Keflex to complete her course.  Nonspecific diarrhea, resolved -C. difficile-negative.  Monitor electrolytes, Imodium as needed.  Atypical chest pain/dyspnea, resolved -VQ scan and bilateral lower extremity Dopplers negative for DVT.  Diabetes mellitus type 2, non-insulin-dependent Chronic peripheral neuropathy secondary to DM 2 -Resume home meds Hemoglobin A1c 7.4 -Continue gabapentin  AKI on CKD stage IIIa, resolved.  Creatinine 0.8 -Admission creatinine 2.1.    Resolved creatinine at baseline.  Will resume her home meds.  Chronic diastolic congestive heart failure Essential hypertension -No evidence of fluid overload.    Transition her back to home Toprol.  Interstitial lung disease with chronic hypoxia, 2 L nasal cannula -Stable.  As needed bronchodilators.  Prednisone 10 mg daily -I-S/flutter-advised to continue to use this at home.  Gout Rheumatoid arthritis -Allopurinol.  Plaquenil  Hypothyroidism -On Synthroid, TSH normal  GERD -PPI  Osteoarthritis -Pain control.  PT/OT-recommending skilled nursing facility but patient would rather go home with home health.  Home health orders placed  Consultations:  None  Subjective: Feels back at her baseline, wishes to go home today.  Lower extremity swelling and erythema has greatly improved with minimal pain.  Ambulating with the help of walker.  Discharge Exam: Vitals:  04/27/19 1924 04/28/19 0455  BP: 108/72 121/74   Pulse: 96 87  Resp: 20 19  Temp: 98.2 F (36.8 C) 98 F (36.7 C)  SpO2: 98% 99%   Vitals:   04/27/19 0829 04/27/19 1131 04/27/19 1924 04/28/19 0455  BP: 129/72 138/82 108/72 121/74  Pulse: 100 100 96 87  Resp: _0 Temp: 98.2 F (36.8 C) 97.6 F (36.4 C) 98.2 F (36.8 C) 98 F (36.7 C)  TempSrc: Oral Oral Oral Oral  SpO2: 93% 97% 98% 99%  Weight:    129.8 kg  Height:        General: Pt is alert, awake, not in acute distress, 2 L nasal cannula-chronic Cardiovascular: RRR, S1/S2 +, no rubs, no gallops Respiratory: Minimal bibasilar rhonchi. Abdominal: Soft, NT, ND, bowel sounds + Extremities: no edema, no cyanosis Minimal bilateral lower extremity erythema, greatly improved.  Otherwise has chronic skin changes.  Discharge Instructions   Allergies as of 04/28/2019      Reactions   Codeine Swelling, Other (See Comments)   Facial swelling Chest pain and swelling in legs Facial swelling   Infliximab Anaphylaxis   "sent me into shock" "sent me into shock"   Lisinopril Swelling   Face and neck swelling Face and neck swelling      Medication List    TAKE these medications   acetaminophen 500 MG tablet Commonly known as: TYLENOL Take 1,000 mg by mouth every 6 (six) hours as needed for moderate pain.   AeroChamber MV inhaler Use as instructed   albuterol 108 (90 Base) MCG/ACT inhaler Commonly known as: VENTOLIN HFA Inhale 2 puffs into the lungs every 6 (six) hours as needed for wheezing or shortness of breath.   allopurinol 300 MG tablet Commonly known as: ZYLOPRIM Take 300 mg by mouth daily.   atorvastatin 20 MG tablet Commonly known as: LIPITOR Take 10 mg by mouth every evening.   azithromycin 500 MG tablet Commonly known as: ZITHROMAX Take 1 tablet by mouth 3 (three) times a week. TAKE ON MON, WED & FRI   Bufferin 325 MG Tabs tablet Generic drug: aspirin buffered Take 325 mg by mouth daily.   CALCIUM + D3 PO Take 2 tablets by mouth daily  with lunch.   cephALEXin 500 MG capsule Commonly known as: KEFLEX Take 1 capsule (500 mg total) by mouth 4 (four) times daily for 4 days.   cholecalciferol 1000 units tablet Commonly known as: VITAMIN D Take 2,000 Units by mouth daily with lunch.   colchicine 0.6 MG tablet Take 0.6 mg by mouth daily.   cyclobenzaprine 5 MG tablet Commonly known as: FLEXERIL Take 1 tablet (5 mg total) by mouth at bedtime as needed for muscle spasms. What changed: when to take this   diclofenac sodium 1 % Gel Commonly known as: VOLTAREN Apply 2 g topically 3 (three) times daily. Both hands and knees   DULoxetine 60 MG capsule Commonly known as: CYMBALTA Take 1 capsule (60 mg total) by mouth daily.   fluticasone 50 MCG/ACT nasal spray Commonly known as: FLONASE Place 2 sprays into both nostrils daily.   furosemide 20 MG tablet Commonly known as: LASIX Take 3 tablets (60 mg total) by mouth daily.   gabapentin 600 MG tablet Commonly known as: NEURONTIN Take two in the morning , Two mid- afternoon and two at bedtime What changed:   how much to take  how to take this  when to take this   guaiFENesin 600 MG 12  hr tablet Commonly known as: MUCINEX Take 1,200 mg by mouth 2 (two) times daily.   hydroxychloroquine 200 MG tablet Commonly known as: PLAQUENIL Take 400 mg by mouth daily.   ipratropium-albuterol 0.5-2.5 (3) MG/3ML Soln Commonly known as: DUONEB Take 3 mLs by nebulization every 6 (six) hours as needed (cough/wheezing).   leflunomide 10 MG tablet Commonly known as: ARAVA Take 10 mg by mouth daily.   levothyroxine 50 MCG tablet Commonly known as: SYNTHROID Take 1 tablet (50 mcg total) by mouth daily.   linaclotide 72 MCG capsule Commonly known as: Linzess Take 1 capsule (72 mcg total) by mouth daily before breakfast.   metoprolol succinate 100 MG 24 hr tablet Commonly known as: Toprol XL Take 1/2 tablet by mouth daily with or immediately following a meal.    morphine 15 MG 12 hr tablet Commonly known as: MS CONTIN Take 1 tablet (15 mg total) by mouth every 12 (twelve) hours. Do Not Fill Before `05/01/2019 What changed: Another medication with the same name was changed. Make sure you understand how and when to take each.   morphine 15 MG tablet Commonly known as: MSIR Take 1 tablet (15 mg total) by mouth 2 (two) times daily as needed for moderate pain ([facility]). What changed:   when to take this  additional instructions   multivitamin with minerals Tabs tablet Take 1 tablet by mouth daily.   olmesartan 40 MG tablet Commonly known as: BENICAR Take 1 tablet (40 mg total) by mouth daily.   OXYGEN Inhale 2 L into the lungs at bedtime. And on exertion   Ozempic (0.25 or 0.5 MG/DOSE) 2 MG/1.5ML Sopn Generic drug: Semaglutide(0.25 or 0.5MG/DOS) Inject 0.5 mg into the skin once a week.   pantoprazole 40 MG tablet Commonly known as: PROTONIX take 1 tablet by mouth once daily MINUTES BEFORE 1ST MEAL OF THE DAY   potassium chloride 10 MEQ tablet Commonly known as: KLOR-CON Take 1 tablet (10 mEq total) by mouth daily.   predniSONE 5 MG tablet Commonly known as: DELTASONE Take 10 mg by mouth every morning.   sodium chloride 0.65 % Soln nasal spray Commonly known as: OCEAN Place 1 spray into both nostrils as needed for congestion.      Follow-up Information    [name], MD. Go on 05/12/2019.   Specialty: Family Medicine Why: _0 :[place] information: [place] [phone]        Care, [facility] Follow up.   Specialty: Home Health Services Why: [facility] , RN, PT, aide, [place], SW Contact information: [address] [place] [address] [phone]          Allergies  Allergen Reactions  . Codeine Swelling and Other (See Comments)    Facial swelling Chest pain and swelling in legs  Facial swelling  . Infliximab Anaphylaxis     "sent me into shock" "sent me into shock"  . Lisinopril Swelling    Face and neck swelling Face and neck swelling    You were cared for by a hospitalist during your hospital stay. If you have any questions about your discharge medications or the care you received while you were in the hospital after you are discharged, you can call the unit and asked to speak with the hospitalist on call if the hospitalist that took care of you is not available. Once you are discharged, your primary care physician will handle any further medical issues. Please note  that no refills for any discharge medications will be authorized once you are discharged, as it is imperative that you return to your primary care physician (or establish a relationship with a primary care physician if you do not have one) for your aftercare needs so that they can reassess your need for medications and monitor your lab values.   Procedures/Studies: DG Chest 2 View  Result Date: 04/21/2019 CLINICAL DATA:  Chest pain EXAM: CHEST - 2 VIEW COMPARISON:  June 02, 2018 FINDINGS: There is a 6 x 6 mm nodular opacity in the right mid lung region. There is no edema or consolidation. Heart size and pulmonary vascularity are normal. No adenopathy. No bone lesions. IMPRESSION: 6 x 6 mm nodular opacity right mid lung. Advise noncontrast enhanced chest CT to further assess. No edema or consolidation. Cardiac silhouette normal. No adenopathy. Electronically Signed   By: Lowella Grip III M.D.   On: 04/21/2019 15:38   CT Chest Wo Contrast  Result Date: 04/21/2019 CLINICAL DATA:  Chest pain, shortness of breath. EXAM: CT CHEST WITHOUT CONTRAST TECHNIQUE: Multidetector CT imaging of the chest was performed following the standard protocol without IV contrast. COMPARISON:  Radiograph of same day. CT scan of November 24, 2018. FINDINGS: Cardiovascular: No significant vascular findings. Normal heart size. No pericardial effusion. Mediastinum/Nodes: No  enlarged mediastinal or axillary lymph nodes. Thyroid gland, trachea, and esophagus demonstrate no significant findings. Lungs/Pleura: No pneumothorax or pleural effusion is noted. Stable bronchiectasis and probable scarring is noted in both lower lobes. No definite acute abnormality is noted. Upper Abdomen: No acute abnormality. Musculoskeletal: No chest wall mass or suspicious bone lesions identified. IMPRESSION: Stable bronchiectasis and probable scarring is noted in both lower lobes. No acute abnormality seen in the chest. Electronically Signed   By: Marijo Conception M.D.   On: 04/21/2019 19:25   NM Pulmonary Perfusion  Result Date: 04/22/2019 CLINICAL DATA:  Chest pain, shortness of breath, COPD EXAM: NUCLEAR MEDICINE PERFUSION LUNG SCAN TECHNIQUE: Perfusion images were obtained in multiple projections after intravenous injection of radiopharmaceutical. Ventilation scans intentionally deferred if perfusion scan and chest x-ray adequate for interpretation during COVID 19 epidemic. RADIOPHARMACEUTICALS:  1.45 mCi Tc-21mMAA IV COMPARISON:  Chest x-ray 04/21/2019 FINDINGS: Perfusion images of the bilateral lungs demonstrate no segmental perfusion defects within either lung IMPRESSION: Very low probability for pulmonary embolism. Electronically Signed   By: NDavina PokeM.D.   On: 04/22/2019 14:59   [place]RENAL  Result Date: 04/22/2019 CLINICAL DATA:  Acute kidney injury EXAM: RENAL / URINARY TRACT ULTRASOUND COMPLETE COMPARISON:  None. FINDINGS: Right Kidney: Renal measurements: 9.8 x 5.2 x 5.3 cm = volume: 139 mL. Upper pole cyst measures up to 6.1 cm. Parapelvic cyst measures 2.8 cm. No hydronephrosis. Normal echotexture. Left Kidney: Renal measurements: 8.5 x 4.5 x 3.8 cm = volume: 76 mL. 1.4 cm exophytic cyst off the midpole. Normal echotexture. No hydronephrosis. Bladder: Appears normal for degree of bladder distention. Other: None. IMPRESSION: Bilateral renal cysts. No acute findings.  No  hydronephrosis. Electronically Signed   By: KRolm BaptiseM.D.   On: 04/22/2019 00:43   DG CHEST PORT 1 VIEW  Result Date: 04/27/2019 CLINICAL DATA:  78year old with shortness of breath. EXAM: PORTABLE CHEST 1 VIEW COMPARISON:  04/21/2019 and earlier, including CT chest of that same date. FINDINGS: Cardiac silhouette upper normal in size for AP portable technique, unchanged. Bronchiectasis involving the lower lobes as noted on the recent CT, unchanged. Mildly prominent bronchovascular markings diffusely and  mild central peribronchial thickening, unchanged over multiple prior examinations. No new pulmonary parenchymal abnormalities. IMPRESSION: 1. Stable bronchiectasis involving the lower lobes as noted on the recent CT. No acute cardiopulmonary disease. 2. Stable mild changes of chronic bronchitis and/or asthma. Electronically Signed   By: Evangeline Dakin M.D.   On: 04/27/2019 08:25   VAS [place] LOWER EXTREMITY VENOUS (DVT)  Result Date: 04/22/2019  Lower Venous Study Indications: Pulmonary embolism, Swelling, and Pain.  Limitations: Body habitus, poor ultrasound/tissue interface and pain, restricted mobility. Comparison Study: Negative lower extremity venous duplex 06/03/2018 Performing Technologist: Maudry Mayhew MHA, RDMS, RVT, RDCS  Examination Guidelines: A complete evaluation includes B-mode imaging, spectral Doppler, color Doppler, and power Doppler as needed of all accessible portions of each vessel. Bilateral testing is considered an integral part of a complete examination. Limited examinations for reoccurring indications may be performed as noted.  +---------+---------------+---------+-----------+----------+--------------+ RIGHT    CompressibilityPhasicitySpontaneityPropertiesThrombus Aging +---------+---------------+---------+-----------+----------+--------------+ CFV                                                   Not visualized  +---------+---------------+---------+-----------+----------+--------------+ SFJ                                                   Not visualized +---------+---------------+---------+-----------+----------+--------------+ FV Prox  Full                                                        +---------+---------------+---------+-----------+----------+--------------+ FV Mid   Full                                                        +---------+---------------+---------+-----------+----------+--------------+ FV DistalFull                                                        +---------+---------------+---------+-----------+----------+--------------+ PFV                                                   Not visualized +---------+---------------+---------+-----------+----------+--------------+ POP      Full           Yes      Yes                                 +---------+---------------+---------+-----------+----------+--------------+ PTV  Not visualized +---------+---------------+---------+-----------+----------+--------------+ PERO                                                  Not visualized +---------+---------------+---------+-----------+----------+--------------+   +---------+---------------+---------+-----------+----------+--------------+ LEFT     CompressibilityPhasicitySpontaneityPropertiesThrombus Aging +---------+---------------+---------+-----------+----------+--------------+ CFV                                                   Not visualized +---------+---------------+---------+-----------+----------+--------------+ SFJ                                                   Not visualized +---------+---------------+---------+-----------+----------+--------------+ FV Prox  Full                                                         +---------+---------------+---------+-----------+----------+--------------+ FV Mid   Full                                                        +---------+---------------+---------+-----------+----------+--------------+ FV Distal                                             Not visualized +---------+---------------+---------+-----------+----------+--------------+ PFV                                                   Not visualized +---------+---------------+---------+-----------+----------+--------------+ POP      Full           Yes      Yes                                 +---------+---------------+---------+-----------+----------+--------------+ PTV                                                   Not visualized +---------+---------------+---------+-----------+----------+--------------+ PERO                                                  Not visualized +---------+---------------+---------+-----------+----------+--------------+     Summary: Right: There is no evidence of deep vein thrombosis in the lower extremity. However, portions of this examination were limited- see technologist comments above. No cystic structure found in the popliteal  fossa. Left: There is no evidence of deep vein thrombosis in the lower extremity. However, portions of this examination were limited- see technologist comments above. No cystic structure found in the popliteal fossa.  *See table(s) above for measurements and observations. Electronically signed by Deitra Mayo MD on 04/22/2019 at 7:28:13 PM.    Final       The results of significant diagnostics from this hospitalization (including imaging, microbiology, ancillary and laboratory) are listed below for reference.     Microbiology: Recent Results (from the past 240 hour(s))  SARS CORONAVIRUS 2 (TAT 6-24 HRS) Nasopharyngeal Nasopharyngeal Swab     Status: None   Collection Time: 04/21/19 10:10 PM   Specimen: Nasopharyngeal  Swab  Result Value Ref Range Status   SARS Coronavirus 2 NEGATIVE NEGATIVE Final    Comment: (NOTE) SARS-CoV-2 target nucleic acids are NOT DETECTED. The SARS-CoV-2 RNA is generally detectable in upper and lower respiratory specimens during the acute phase of infection. Negative results do not preclude SARS-CoV-2 infection, do not rule out co-infections with other pathogens, and should not be used as the sole basis for treatment or other patient management decisions. Negative results must be combined with clinical observations, patient history, and epidemiological information. The expected result is Negative. Fact Sheet for Patients: SugarRoll.be Fact Sheet for Healthcare Providers: https://www.woods-mathews.com/ This test is not yet approved or cleared by the [place] FDA and  has been authorized for detection and/or diagnosis of SARS-CoV-2 by FDA under an Emergency Use Authorization (EUA). This EUA will remain  in effect (meaning this test can be used) for the duration of the COVID-19 declaration under Section 56 4(b)(1) of the Act, 21 U.S.C. section 360bbb-3(b)(1), unless the authorization is terminated or revoked sooner. Performed at [facility], [place] [address] [place], [place] [address]   Culture, blood (routine x 2)     Status: None   Collection Time: 04/22/19  2:15 PM   Specimen: BLOOD RIGHT ARM  Result Value Ref Range Status   Specimen Description BLOOD RIGHT ARM  Final   Special Requests   Final    BOTTLES DRAWN AEROBIC ONLY Blood Culture adequate volume   Culture   Final    NO GROWTH 5 DAYS Performed at [facility], [place] [address] [place], [place] [address]    Report Status 04/27/2019 FINAL  Final  Culture, blood (routine x 2)     Status: None   Collection Time: 04/22/19  2:45 PM   Specimen: BLOOD LEFT ARM  Result Value Ref Range Status   Specimen Description BLOOD LEFT ARM  Final   Special Requests    Final    BOTTLES DRAWN AEROBIC ONLY Blood Culture adequate volume   Culture   Final    NO GROWTH 5 DAYS Performed at [facility], [place] [address] [place], [place] [address]    Report Status 04/27/2019 FINAL  Final  Culture, Urine     Status: Abnormal   Collection Time: 04/22/19  4:11 PM   Specimen: Urine, Catheterized  Result Value Ref Range Status   Specimen Description URINE, CATHETERIZED  Final   Special Requests   Final    NONE Performed at [facility], [place] [address] [place], [place] [address]    Culture >=100,000 COLONIES/mL KLEBSIELLA PNEUMONIAE (A)  Final   Report Status 04/24/2019 FINAL  Final   Organism ID, Bacteria KLEBSIELLA PNEUMONIAE (A)  Final      Susceptibility   Klebsiella pneumoniae -  MIC*    AMPICILLIN >=32 RESISTANT Resistant     CEFAZOLIN <=4 SENSITIVE Sensitive     CEFTRIAXONE <=1 SENSITIVE Sensitive     CIPROFLOXACIN <=0.25 SENSITIVE Sensitive     GENTAMICIN <=1 SENSITIVE Sensitive     IMIPENEM <=0.25 SENSITIVE Sensitive     NITROFURANTOIN 128 RESISTANT Resistant     TRIMETH/SULFA >=320 RESISTANT Resistant     AMPICILLIN/SULBACTAM 4 SENSITIVE Sensitive     PIP/TAZO <=4 SENSITIVE Sensitive     Extended ESBL NEGATIVE Sensitive     * >=100,000 COLONIES/mL KLEBSIELLA PNEUMONIAE  MRSA PCR Screening     Status: None   Collection Time: 04/22/19 10:07 PM   Specimen: Nasopharyngeal  Result Value Ref Range Status   MRSA by PCR NEGATIVE NEGATIVE Final    Comment:        The GeneXpert MRSA Assay (FDA approved for NASAL specimens only), is one component of a comprehensive MRSA colonization surveillance program. It is not intended to diagnose MRSA infection nor to guide or monitor treatment for MRSA infections. Performed at [facility], [place] [address], [place], [place] [address]   C difficile quick scan w PCR reflex     Status: None   Collection Time: 04/24/19  5:14 PM   Specimen: STOOL  Result Value Ref Range Status   C Diff  antigen NEGATIVE NEGATIVE Final   C Diff toxin NEGATIVE NEGATIVE Final   C Diff interpretation No C. difficile detected.  Final    Comment: Performed at [facility], [place] [address] [place], [place] [address]     Labs: BNP (last 3 results) Recent Labs    06/02/18 1546 04/21/19 1536 04/27/19 0336  BNP 7.9 10.8 21.2   Basic Metabolic Panel: Recent Labs  Lab 04/22/19 0213 04/23/19 0647 04/24/19 1124 04/25/19 0511 04/26/19 0417 04/27/19 0336 04/28/19 0309  NA  --   --  139 141 140 139 140  K  --   --  5.0 3.6 4.1 4.2 4.0  CL  --   --  112* 109 109 107 107  CO2  --   --  18* _0 GLUCOSE  --   --  171* 144* 148* 135* 112*  BUN  --   --  <5* 5* 5* 5* 5*  CREATININE  --   --  0.77 0.82 0.78 0.88 0.92  CALCIUM  --   --  7.7* 8.1* 8.2* 8.3* 8.0*  MG 1.5* 1.7  --   --  1.6* 1.9 1.8   Liver Function Tests: Recent Labs  Lab 04/21/19 1539  AST 33  ALT 36  ALKPHOS 94  BILITOT 0.6  PROT 5.5*  ALBUMIN 3.0*   No results for input(s): LIPASE, AMYLASE in the last 168 hours. No results for input(s): AMMONIA in the last 168 hours. CBC: Recent Labs  Lab 04/24/19 0522 04/25/19 0511 04/26/19 0417 04/27/19 0336 04/28/19 0309  WBC 4.8 5.4 6.2 6.5 5.8  HGB 11.4* 11.8* 11.5* 11.5* 11.2*  HCT 37.4 38.1 37.1 37.0 35.7*  MCV 97.4 96.0 95.4 94.4 94.7  PLT 125* 132* 146* 156 153   Cardiac Enzymes: No results for input(s): CKTOTAL, CKMB, CKMBINDEX, TROPONINI in the last 168 hours. BNP: Invalid input(s): POCBNP CBG: Recent Labs  Lab 04/27/19 0550 04/27/19 1127 04/27/19 1625 04/27/19 2144 04/28/19 0615  GLUCAP 119* 148* 166* 145* 117*   D-Dimer No results for input(s): DDIMER in the last 72 hours. Hgb A1c No results for input(s):  HGBA1C in the last 72 hours. Lipid Profile No results for input(s): CHOL, HDL, LDLCALC, TRIG, CHOLHDL, LDLDIRECT in the last 72 hours. Thyroid function studies No results for input(s): TSH, T4TOTAL, T3FREE, THYROIDAB in the last  72 hours.  Invalid input(s): FREET3 Anemia work up No results for input(s): VITAMINB12, FOLATE, FERRITIN, TIBC, IRON, RETICCTPCT in the last 72 hours. Urinalysis    Component Value Date/Time   COLORURINE AMBER (A) 04/22/2019 1620   APPEARANCEUR CLOUDY (A) 04/22/2019 1620   LABSPEC 1.023 04/22/2019 1620   PHURINE 5.0 04/22/2019 1620   GLUCOSEU NEGATIVE 04/22/2019 1620   HGBUR SMALL (A) 04/22/2019 1620   BILIRUBINUR SMALL (A) 04/22/2019 1620   KETONESUR 5 (A) 04/22/2019 1620   PROTEINUR 100 (A) 04/22/2019 1620   UROBILINOGEN 1.0 07/12/2012 1234   NITRITE NEGATIVE 04/22/2019 1620   LEUKOCYTESUR LARGE (A) 04/22/2019 1620   Sepsis Labs Invalid input(s): PROCALCITONIN,  WBC,  LACTICIDVEN Microbiology Recent Results (from the past 240 hour(s))  SARS CORONAVIRUS 2 (TAT 6-24 HRS) Nasopharyngeal Nasopharyngeal Swab     Status: None   Collection Time: 04/21/19 10:10 PM   Specimen: Nasopharyngeal Swab  Result Value Ref Range Status   SARS Coronavirus 2 NEGATIVE NEGATIVE Final    Comment: (NOTE) SARS-CoV-2 target nucleic acids are NOT DETECTED. The SARS-CoV-2 RNA is generally detectable in upper and lower respiratory specimens during the acute phase of infection. Negative results do not preclude SARS-CoV-2 infection, do not rule out co-infections with other pathogens, and should not be used as the sole basis for treatment or other patient management decisions. Negative results must be combined with clinical observations, patient history, and epidemiological information. The expected result is Negative. Fact Sheet for Patients: SugarRoll.be Fact Sheet for Healthcare Providers: https://www.woods-mathews.com/ This test is not yet approved or cleared by the [place] FDA and  has been authorized for detection and/or diagnosis of SARS-CoV-2 by FDA under an Emergency Use Authorization (EUA). This EUA will remain  in effect (meaning this test can  be used) for the duration of the COVID-19 declaration under Section 56 4(b)(1) of the Act, 21 U.S.C. section 360bbb-3(b)(1), unless the authorization is terminated or revoked sooner. Performed at [facility], [place] [address] [place], [place] [address]   Culture, blood (routine x 2)     Status: None   Collection Time: 04/22/19  2:15 PM   Specimen: BLOOD RIGHT ARM  Result Value Ref Range Status   Specimen Description BLOOD RIGHT ARM  Final   Special Requests   Final    BOTTLES DRAWN AEROBIC ONLY Blood Culture adequate volume   Culture   Final    NO GROWTH 5 DAYS Performed at [facility], [place] [address] [place], [place] [address]    Report Status 04/27/2019 FINAL  Final  Culture, blood (routine x 2)     Status: None   Collection Time: 04/22/19  2:45 PM   Specimen: BLOOD LEFT ARM  Result Value Ref Range Status   Specimen Description BLOOD LEFT ARM  Final   Special Requests   Final    BOTTLES DRAWN AEROBIC ONLY Blood Culture adequate volume   Culture   Final    NO GROWTH 5 DAYS Performed at [facility], [place] [address], [place], [place] [address]    Report Status 04/27/2019 FINAL  Final  Culture, Urine     Status: Abnormal   Collection Time: 04/22/19  4:11 PM   Specimen: Urine, Catheterized  Result Value Ref Range  Status   Specimen Description URINE, CATHETERIZED  Final   Special Requests   Final    NONE Performed at [facility], [place] [address], [place], [place] [address]    Culture >=100,000 COLONIES/mL KLEBSIELLA PNEUMONIAE (A)  Final   Report Status 04/24/2019 FINAL  Final   Organism ID, Bacteria KLEBSIELLA PNEUMONIAE (A)  Final      Susceptibility   Klebsiella pneumoniae - MIC*    AMPICILLIN >=32 RESISTANT Resistant     CEFAZOLIN <=4 SENSITIVE Sensitive     CEFTRIAXONE <=1 SENSITIVE Sensitive     CIPROFLOXACIN <=0.25 SENSITIVE Sensitive     GENTAMICIN <=1 SENSITIVE Sensitive     IMIPENEM <=0.25 SENSITIVE Sensitive     NITROFURANTOIN 128  RESISTANT Resistant     TRIMETH/SULFA >=320 RESISTANT Resistant     AMPICILLIN/SULBACTAM 4 SENSITIVE Sensitive     PIP/TAZO <=4 SENSITIVE Sensitive     Extended ESBL NEGATIVE Sensitive     * >=100,000 COLONIES/mL KLEBSIELLA PNEUMONIAE  MRSA PCR Screening     Status: None   Collection Time: 04/22/19 10:07 PM   Specimen: Nasopharyngeal  Result Value Ref Range Status   MRSA by PCR NEGATIVE NEGATIVE Final    Comment:        The GeneXpert MRSA Assay (FDA approved for NASAL specimens only), is one component of a comprehensive MRSA colonization surveillance program. It is not intended to diagnose MRSA infection nor to guide or monitor treatment for MRSA infections. Performed at [facility], [place] [address], [place], [place] [address]   C difficile quick scan w PCR reflex     Status: None   Collection Time: 04/24/19  5:14 PM   Specimen: STOOL  Result Value Ref Range Status   C Diff antigen NEGATIVE NEGATIVE Final   C Diff toxin NEGATIVE NEGATIVE Final   C Diff interpretation No C. difficile detected.  Final    Comment: Performed at [facility], [place] [address] [place], [place] [address]     Time coordinating discharge:  I have spent 35 minutes face to face with the patient and on the ward discussing the patients care, assessment, plan and disposition with other care givers. >50% of the time was devoted counseling the patient about the risks and benefits of treatment/Discharge disposition and coordinating care.   SIGNED:   Damita Lack, MD  [facility] 04/28/2019, 11:29 AM   If 7PM-7AM, please contact night-coverage

## 2019-04-29 ENCOUNTER — Telehealth: Payer: Self-pay

## 2019-04-29 NOTE — Telephone Encounter (Signed)
Transition Care Management Follow-up Telephone Call  Date of discharge and from where: 04/28/2019 at [facility]  How have you been since you were released from the hospital? Legs still swollen and itching  Any questions or concerns? No   Items Reviewed:  Did the pt receive and understand the discharge instructions provided? yes  Medications obtained and verified? Yes   Any new allergies since your discharge? No   Dietary orders reviewed? Yes  Do you have support at home? Yes   Other (ie: DME, Home Health, etc) Home health  Functional Questionnaire: (I = Independent and D = Dependent) ADL's: D  Bathing/Dressing- D   Meal Prep- D  Eating- I  Maintaining continence- D  Transferring/Ambulation- D  Managing Meds- D   Follow up appointments reviewed:    PCP Hospital f/u appt confirmed? Yes  Scheduled to see Dr. Volanda Napoleon on 05/12/2019 @ 11:30.  Are transportation arrangements needed? No   If their condition worsens, is the pt aware to call  their PCP or go to the ED? Yes  Was the patient provided with contact information for the PCP's office or ED? Yes  Was the pt encouraged to call back with questions or concerns? Yes

## 2019-04-30 DIAGNOSIS — N281 Cyst of kidney, acquired: Secondary | ICD-10-CM | POA: Diagnosis not present

## 2019-04-30 DIAGNOSIS — E1122 Type 2 diabetes mellitus with diabetic chronic kidney disease: Secondary | ICD-10-CM | POA: Diagnosis not present

## 2019-04-30 DIAGNOSIS — K219 Gastro-esophageal reflux disease without esophagitis: Secondary | ICD-10-CM | POA: Diagnosis not present

## 2019-04-30 DIAGNOSIS — N1832 Chronic kidney disease, stage 3b: Secondary | ICD-10-CM | POA: Diagnosis not present

## 2019-04-30 DIAGNOSIS — Z7952 Long term (current) use of systemic steroids: Secondary | ICD-10-CM | POA: Diagnosis not present

## 2019-04-30 DIAGNOSIS — Z9981 Dependence on supplemental oxygen: Secondary | ICD-10-CM | POA: Diagnosis not present

## 2019-04-30 DIAGNOSIS — L03115 Cellulitis of right lower limb: Secondary | ICD-10-CM | POA: Diagnosis not present

## 2019-04-30 DIAGNOSIS — F329 Major depressive disorder, single episode, unspecified: Secondary | ICD-10-CM | POA: Diagnosis not present

## 2019-04-30 DIAGNOSIS — R918 Other nonspecific abnormal finding of lung field: Secondary | ICD-10-CM | POA: Diagnosis not present

## 2019-04-30 DIAGNOSIS — E1142 Type 2 diabetes mellitus with diabetic polyneuropathy: Secondary | ICD-10-CM | POA: Diagnosis not present

## 2019-04-30 DIAGNOSIS — Z792 Long term (current) use of antibiotics: Secondary | ICD-10-CM | POA: Diagnosis not present

## 2019-04-30 DIAGNOSIS — N390 Urinary tract infection, site not specified: Secondary | ICD-10-CM | POA: Diagnosis not present

## 2019-04-30 DIAGNOSIS — M069 Rheumatoid arthritis, unspecified: Secondary | ICD-10-CM | POA: Diagnosis not present

## 2019-04-30 DIAGNOSIS — J849 Interstitial pulmonary disease, unspecified: Secondary | ICD-10-CM | POA: Diagnosis not present

## 2019-04-30 DIAGNOSIS — I130 Hypertensive heart and chronic kidney disease with heart failure and stage 1 through stage 4 chronic kidney disease, or unspecified chronic kidney disease: Secondary | ICD-10-CM | POA: Diagnosis not present

## 2019-04-30 DIAGNOSIS — J479 Bronchiectasis, uncomplicated: Secondary | ICD-10-CM | POA: Diagnosis not present

## 2019-04-30 DIAGNOSIS — M1030 Gout due to renal impairment, unspecified site: Secondary | ICD-10-CM | POA: Diagnosis not present

## 2019-04-30 DIAGNOSIS — E039 Hypothyroidism, unspecified: Secondary | ICD-10-CM | POA: Diagnosis not present

## 2019-04-30 DIAGNOSIS — M1990 Unspecified osteoarthritis, unspecified site: Secondary | ICD-10-CM | POA: Diagnosis not present

## 2019-04-30 DIAGNOSIS — B961 Klebsiella pneumoniae [K. pneumoniae] as the cause of diseases classified elsewhere: Secondary | ICD-10-CM | POA: Diagnosis not present

## 2019-04-30 DIAGNOSIS — Z79899 Other long term (current) drug therapy: Secondary | ICD-10-CM | POA: Diagnosis not present

## 2019-04-30 DIAGNOSIS — Z79891 Long term (current) use of opiate analgesic: Secondary | ICD-10-CM | POA: Diagnosis not present

## 2019-04-30 DIAGNOSIS — L03116 Cellulitis of left lower limb: Secondary | ICD-10-CM | POA: Diagnosis not present

## 2019-04-30 DIAGNOSIS — I5032 Chronic diastolic (congestive) heart failure: Secondary | ICD-10-CM | POA: Diagnosis not present

## 2019-05-01 DIAGNOSIS — I130 Hypertensive heart and chronic kidney disease with heart failure and stage 1 through stage 4 chronic kidney disease, or unspecified chronic kidney disease: Secondary | ICD-10-CM | POA: Diagnosis not present

## 2019-05-01 DIAGNOSIS — I5032 Chronic diastolic (congestive) heart failure: Secondary | ICD-10-CM | POA: Diagnosis not present

## 2019-05-01 DIAGNOSIS — N390 Urinary tract infection, site not specified: Secondary | ICD-10-CM | POA: Diagnosis not present

## 2019-05-01 DIAGNOSIS — L03116 Cellulitis of left lower limb: Secondary | ICD-10-CM | POA: Diagnosis not present

## 2019-05-01 DIAGNOSIS — B961 Klebsiella pneumoniae [K. pneumoniae] as the cause of diseases classified elsewhere: Secondary | ICD-10-CM | POA: Diagnosis not present

## 2019-05-01 DIAGNOSIS — L03115 Cellulitis of right lower limb: Secondary | ICD-10-CM | POA: Diagnosis not present

## 2019-05-02 DIAGNOSIS — N390 Urinary tract infection, site not specified: Secondary | ICD-10-CM | POA: Diagnosis not present

## 2019-05-02 DIAGNOSIS — L03115 Cellulitis of right lower limb: Secondary | ICD-10-CM | POA: Diagnosis not present

## 2019-05-02 DIAGNOSIS — I130 Hypertensive heart and chronic kidney disease with heart failure and stage 1 through stage 4 chronic kidney disease, or unspecified chronic kidney disease: Secondary | ICD-10-CM | POA: Diagnosis not present

## 2019-05-02 DIAGNOSIS — L03116 Cellulitis of left lower limb: Secondary | ICD-10-CM | POA: Diagnosis not present

## 2019-05-02 DIAGNOSIS — I5032 Chronic diastolic (congestive) heart failure: Secondary | ICD-10-CM | POA: Diagnosis not present

## 2019-05-02 DIAGNOSIS — B961 Klebsiella pneumoniae [K. pneumoniae] as the cause of diseases classified elsewhere: Secondary | ICD-10-CM | POA: Diagnosis not present

## 2019-05-03 ENCOUNTER — Telehealth: Payer: Self-pay

## 2019-05-03 NOTE — Telephone Encounter (Signed)
Copied from [place] [phone]. Topic: General - Other >> May 03, 2019  2:59 PM Rainey Pines A wrote: Mordecai Rasmussen from [place] would like to know if Dr .Volanda Napoleon can sign off on the coninuation of home health and also inform Dr. Volanda Napoleon of a major drug interaction between hydroxychloroquine (PLAQUENIL) 200 MG tablet, and azithromycin (ZITHROMAX) 500 MG tablet . Best contact for Mordecai Rasmussen [phone]

## 2019-05-05 ENCOUNTER — Other Ambulatory Visit: Payer: Self-pay

## 2019-05-05 ENCOUNTER — Telehealth: Payer: Self-pay | Admitting: Nurse Practitioner

## 2019-05-05 ENCOUNTER — Other Ambulatory Visit: Payer: Medicare Other | Admitting: Nurse Practitioner

## 2019-05-05 DIAGNOSIS — L03115 Cellulitis of right lower limb: Secondary | ICD-10-CM | POA: Diagnosis not present

## 2019-05-05 DIAGNOSIS — B961 Klebsiella pneumoniae [K. pneumoniae] as the cause of diseases classified elsewhere: Secondary | ICD-10-CM | POA: Diagnosis not present

## 2019-05-05 DIAGNOSIS — I5032 Chronic diastolic (congestive) heart failure: Secondary | ICD-10-CM | POA: Diagnosis not present

## 2019-05-05 DIAGNOSIS — N390 Urinary tract infection, site not specified: Secondary | ICD-10-CM | POA: Diagnosis not present

## 2019-05-05 DIAGNOSIS — I130 Hypertensive heart and chronic kidney disease with heart failure and stage 1 through stage 4 chronic kidney disease, or unspecified chronic kidney disease: Secondary | ICD-10-CM | POA: Diagnosis not present

## 2019-05-05 DIAGNOSIS — L03116 Cellulitis of left lower limb: Secondary | ICD-10-CM | POA: Diagnosis not present

## 2019-05-05 NOTE — Telephone Encounter (Signed)
I called [name] for scheduled telemedicine Palliative care follow up visit. [name] requests to reschedule for 06/02/2019 at 2pm.

## 2019-05-09 DIAGNOSIS — L03115 Cellulitis of right lower limb: Secondary | ICD-10-CM | POA: Diagnosis not present

## 2019-05-09 DIAGNOSIS — L03116 Cellulitis of left lower limb: Secondary | ICD-10-CM | POA: Diagnosis not present

## 2019-05-09 DIAGNOSIS — I5032 Chronic diastolic (congestive) heart failure: Secondary | ICD-10-CM | POA: Diagnosis not present

## 2019-05-09 DIAGNOSIS — B961 Klebsiella pneumoniae [K. pneumoniae] as the cause of diseases classified elsewhere: Secondary | ICD-10-CM | POA: Diagnosis not present

## 2019-05-09 DIAGNOSIS — I130 Hypertensive heart and chronic kidney disease with heart failure and stage 1 through stage 4 chronic kidney disease, or unspecified chronic kidney disease: Secondary | ICD-10-CM | POA: Diagnosis not present

## 2019-05-09 DIAGNOSIS — N390 Urinary tract infection, site not specified: Secondary | ICD-10-CM | POA: Diagnosis not present

## 2019-05-09 NOTE — Telephone Encounter (Signed)
error 

## 2019-05-10 ENCOUNTER — Telehealth: Payer: Self-pay | Admitting: *Deleted

## 2019-05-10 DIAGNOSIS — I5032 Chronic diastolic (congestive) heart failure: Secondary | ICD-10-CM | POA: Diagnosis not present

## 2019-05-10 DIAGNOSIS — B961 Klebsiella pneumoniae [K. pneumoniae] as the cause of diseases classified elsewhere: Secondary | ICD-10-CM | POA: Diagnosis not present

## 2019-05-10 DIAGNOSIS — L03115 Cellulitis of right lower limb: Secondary | ICD-10-CM | POA: Diagnosis not present

## 2019-05-10 DIAGNOSIS — N390 Urinary tract infection, site not specified: Secondary | ICD-10-CM | POA: Diagnosis not present

## 2019-05-10 DIAGNOSIS — L03116 Cellulitis of left lower limb: Secondary | ICD-10-CM | POA: Diagnosis not present

## 2019-05-10 DIAGNOSIS — I130 Hypertensive heart and chronic kidney disease with heart failure and stage 1 through stage 4 chronic kidney disease, or unspecified chronic kidney disease: Secondary | ICD-10-CM | POA: Diagnosis not present

## 2019-05-10 NOTE — Telephone Encounter (Signed)
Copied from [place] [phone]. Topic: General - Other >> May 10, 2019  1:30 PM Rainey Pines A wrote: Timmothy Sours occupational [facility]rpas[facility] called to inform Dr. Volanda Napoleon that patient Missed visit  for 12/22 today. Timmothy Sours will be out sick for the rest of the week and   would like to get verbal order for a visit outside of this week to just add one visit. Best contact is  [phone]

## 2019-05-11 ENCOUNTER — Other Ambulatory Visit: Payer: Self-pay

## 2019-05-11 DIAGNOSIS — I5032 Chronic diastolic (congestive) heart failure: Secondary | ICD-10-CM | POA: Diagnosis not present

## 2019-05-11 DIAGNOSIS — L03116 Cellulitis of left lower limb: Secondary | ICD-10-CM | POA: Diagnosis not present

## 2019-05-11 DIAGNOSIS — I130 Hypertensive heart and chronic kidney disease with heart failure and stage 1 through stage 4 chronic kidney disease, or unspecified chronic kidney disease: Secondary | ICD-10-CM | POA: Diagnosis not present

## 2019-05-11 DIAGNOSIS — B961 Klebsiella pneumoniae [K. pneumoniae] as the cause of diseases classified elsewhere: Secondary | ICD-10-CM | POA: Diagnosis not present

## 2019-05-11 DIAGNOSIS — L03115 Cellulitis of right lower limb: Secondary | ICD-10-CM | POA: Diagnosis not present

## 2019-05-11 DIAGNOSIS — N390 Urinary tract infection, site not specified: Secondary | ICD-10-CM | POA: Diagnosis not present

## 2019-05-11 NOTE — Telephone Encounter (Signed)
[facility] for the order

## 2019-05-11 NOTE — Telephone Encounter (Signed)
Ok.

## 2019-05-11 NOTE — Telephone Encounter (Signed)
Left a message for [facility] regarding approved orders for pt

## 2019-05-12 ENCOUNTER — Encounter: Payer: Self-pay | Admitting: Family Medicine

## 2019-05-12 ENCOUNTER — Ambulatory Visit (INDEPENDENT_AMBULATORY_CARE_PROVIDER_SITE_OTHER): Payer: Medicare Other | Admitting: Family Medicine

## 2019-05-12 VITALS — BP 138/72 | HR 96 | Temp 96.8°F | Wt 264.0 lb

## 2019-05-12 DIAGNOSIS — J849 Interstitial pulmonary disease, unspecified: Secondary | ICD-10-CM | POA: Diagnosis not present

## 2019-05-12 DIAGNOSIS — N179 Acute kidney failure, unspecified: Secondary | ICD-10-CM

## 2019-05-12 DIAGNOSIS — Z09 Encounter for follow-up examination after completed treatment for conditions other than malignant neoplasm: Secondary | ICD-10-CM

## 2019-05-12 DIAGNOSIS — I5032 Chronic diastolic (congestive) heart failure: Secondary | ICD-10-CM | POA: Diagnosis not present

## 2019-05-12 DIAGNOSIS — N189 Chronic kidney disease, unspecified: Secondary | ICD-10-CM | POA: Diagnosis not present

## 2019-05-12 DIAGNOSIS — E1140 Type 2 diabetes mellitus with diabetic neuropathy, unspecified: Secondary | ICD-10-CM

## 2019-05-12 DIAGNOSIS — L03116 Cellulitis of left lower limb: Secondary | ICD-10-CM | POA: Diagnosis not present

## 2019-05-12 DIAGNOSIS — R5381 Other malaise: Secondary | ICD-10-CM

## 2019-05-12 LAB — CBC
HCT: 37.4 % (ref 36.0–46.0)
Hemoglobin: 11.9 g/dL — ABNORMAL LOW (ref 12.0–15.0)
MCHC: 31.8 g/dL (ref 30.0–36.0)
MCV: 95.3 fl (ref 78.0–100.0)
Platelets: 194 10*3/uL (ref 150.0–400.0)
RBC: 3.92 Mil/uL (ref 3.87–5.11)
RDW: 17.8 % — ABNORMAL HIGH (ref 11.5–15.5)
WBC: 7.3 10*3/uL (ref 4.0–10.5)

## 2019-05-12 LAB — BASIC METABOLIC PANEL
BUN: 10 mg/dL (ref 6–23)
CO2: 37 mEq/L — ABNORMAL HIGH (ref 19–32)
Calcium: 8.6 mg/dL (ref 8.4–10.5)
Chloride: 100 mEq/L (ref 96–112)
Creatinine, Ser: 0.78 mg/dL (ref 0.40–1.20)
GFR: 86.55 mL/min (ref >60.00)
Glucose, Bld: 137 mg/dL — ABNORMAL HIGH (ref 70–99)
Potassium: 3.4 mEq/L — ABNORMAL LOW (ref 3.5–5.1)
Sodium: 145 mEq/L (ref 135–145)

## 2019-05-12 MED ORDER — POTASSIUM CHLORIDE CRYS ER 20 MEQ PO TBCR: 20.0000 meq | EXTENDED_RELEASE_TABLET | Freq: Every day | ORAL | 3 refills | Status: DC

## 2019-05-12 NOTE — Patient Instructions (Signed)
Acute Kidney Injury, Adult  Acute kidney injury is a sudden worsening of kidney function. The kidneys are organs that have several jobs. They filter the blood to remove waste products and extra fluid. They also maintain a healthy balance of minerals and hormones in the body, which helps control blood pressure and keep bones strong. With this condition, your kidneys do not do their jobs as well as they should. This condition ranges from mild to severe. Over time it may develop into long-lasting (chronic) kidney disease. Early detection and treatment may prevent acute kidney injury from developing into a chronic condition. What are the causes? Common causes of this condition include:  A problem with blood flow to the kidneys. This may be caused by: ? Low blood pressure (hypotension) or shock. ? Blood loss. ? Heart and blood vessel (cardiovascular) disease. ? Severe burns. ? Liver disease.  Direct damage to the kidneys. This may be caused by: ? Certain medicines. ? A kidney infection. ? Poisoning. ? Being around or in contact with toxic substances. ? A surgical wound. ? A hard, direct hit to the kidney area.  A sudden blockage of urine flow. This may be caused by: ? Cancer. ? Kidney stones. ? An enlarged prostate in males. What are the signs or symptoms? Symptoms of this condition may not be obvious until the condition becomes severe. Symptoms of this condition can include:  Tiredness (lethargy), or difficulty staying awake.  Nausea or vomiting.  Swelling (edema) of the face, legs, ankles, or feet.  Problems with urination, such as: ? Abdominal pain, or pain along the side of your stomach (flank). ? Decreased urine production. ? Decrease in the force of urine flow.  Muscle twitches and cramps, especially in the legs.  Confusion or trouble concentrating.  Loss of appetite.  Fever. How is this diagnosed? This condition may be diagnosed with tests, including:  Blood  tests.  Urine tests.  Imaging tests.  A test in which a sample of tissue is removed from the kidneys to be examined under a microscope (kidney biopsy). How is this treated? Treatment for this condition depends on the cause and how severe the condition is. In mild cases, treatment may not be needed. The kidneys may heal on their own. In more severe cases, treatment will involve:  Treating the cause of the kidney injury. This may involve changing any medicines you are taking or adjusting your dosage.  Fluids. You may need specialized IV fluids to balance your body's needs.  Having a catheter placed to drain urine and prevent blockages.  Preventing problems from occurring. This may mean avoiding certain medicines or procedures that can cause further injury to the kidneys. In some cases treatment may also require:  A procedure to remove toxic wastes from the body (dialysis or continuous renal replacement therapy - CRRT).  Surgery. This may be done to repair a torn kidney, or to remove the blockage from the urinary system. Follow these instructions at home: Medicines  Take over-the-counter and prescription medicines only as told by your health care provider.  Do not take any new medicines without your health care provider's approval. Many medicines can worsen your kidney damage.  Do not take any vitamin and mineral supplements without your health care provider's approval. Many nutritional supplements can worsen your kidney damage. Lifestyle  If your health care provider prescribed changes to your diet, follow them. You may need to decrease the amount of protein you eat.  Achieve and maintain a healthy  weight. If you need help with this, ask your health care provider.  Start or continue an exercise plan. Try to exercise at least 30 minutes a day, 5 days a week.  Do not use any tobacco products, such as cigarettes, chewing tobacco, and e-cigarettes. If you need help quitting, ask your  health care provider. General instructions  Keep track of your blood pressure. Report changes in your blood pressure as told by your health care provider.  Stay up to date with immunizations. Ask your health care provider which immunizations you need.  Keep all follow-up visits as told by your health care provider. This is important. Where to find more information  American Association of Kidney Patients: BombTimer.gl  National Kidney Foundation: www.kidney.[place]: https://mathis.com/  Life Options [facility]: ? www.lifeoptions.org ? www.kidneyschool.org Contact a health care provider if:  Your symptoms get worse.  You develop new symptoms. Get help right away if:  You develop symptoms of worsening kidney disease, which include: ? Headaches. ? Abnormally dark or light skin. ? Easy bruising. ? Frequent hiccups. ? Chest pain. ? Shortness of breath. ? End of menstruation in women. ? Seizures. ? Confusion or altered mental status. ? Abdominal or back pain. ? Itchiness.  You have a fever.  Your body is producing less urine.  You have pain or bleeding when you urinate. Summary  Acute kidney injury is a sudden worsening of kidney function.  Acute kidney injury can be caused by problems with blood flow to the kidneys, direct damage to the kidneys, and sudden blockage of urine flow.  Symptoms of this condition may not be obvious until it becomes severe. Symptoms may include edema, lethargy, confusion, nausea or vomiting, and problems passing urine.  This condition can usually be diagnosed with blood tests, urine tests, and imaging tests. Sometimes a kidney biopsy is done to diagnose this condition.  Treatment for this condition often involves treating the underlying cause. It is treated with fluids, medicines, dialysis, diet changes, or surgery. This information is not intended to replace advice given to you by your health care provider. Make  sure you discuss any questions you have with your health care provider. Document Released: 11/18/2010 Document Revised: 04/17/2017 Document Reviewed: 04/25/2016 Elsevier Patient Education  2020 [employer].  Diabetes Mellitus and Exercise Exercising regularly is important for your overall health, especially when you have diabetes (diabetes mellitus). Exercising is not only about losing weight. It has many other health benefits, such as increasing muscle strength and bone density and reducing body fat and stress. This leads to improved fitness, flexibility, and endurance, all of which result in better overall health. Exercise has additional benefits for people with diabetes, including:  Reducing appetite.  Helping to lower and control blood glucose.  Lowering blood pressure.  Helping to control amounts of fatty substances (lipids) in the blood, such as cholesterol and triglycerides.  Helping the body to respond better to insulin (improving insulin sensitivity).  Reducing how much insulin the body needs.  Decreasing the risk for heart disease by: ? Lowering cholesterol and triglyceride levels. ? Increasing the levels of good cholesterol. ? Lowering blood glucose levels. What is my activity plan? Your health care provider or certified diabetes educator can help you make a plan for the type and frequency of exercise (activity plan) that works for you. Make sure that you:  Do at least 150 minutes of moderate-intensity or vigorous-intensity exercise each week. This could be brisk walking, biking, or water aerobics. ?  Do stretching and strength exercises, such as yoga or weightlifting, at least 2 times a week. ? Spread out your activity over at least 3 days of the week.  Get some form of physical activity every day. ? Do not go more than 2 days in a row without some kind of physical activity. ? Avoid being inactive for more than 30 minutes at a time. Take frequent breaks to walk or  stretch.  Choose a type of exercise or activity that you enjoy, and set realistic goals.  Start slowly, and gradually increase the intensity of your exercise over time. What do I need to know about managing my diabetes?   Check your blood glucose before and after exercising. ? If your blood glucose is 240 mg/dL (13.3 mmol/L) or higher before you exercise, check your urine for ketones. If you have ketones in your urine, do not exercise until your blood glucose returns to normal. ? If your blood glucose is 100 mg/dL (5.6 mmol/L) or lower, eat a snack containing 15-20 grams of carbohydrate. Check your blood glucose 15 minutes after the snack to make sure that your level is above 100 mg/dL (5.6 mmol/L) before you start your exercise.  Know the symptoms of low blood glucose (hypoglycemia) and how to treat it. Your risk for hypoglycemia increases during and after exercise. Common symptoms of hypoglycemia can include: ? Hunger. ? Anxiety. ? Sweating and feeling clammy. ? Confusion. ? Dizziness or feeling light-headed. ? Increased heart rate or palpitations. ? Blurry vision. ? Tingling or numbness around the mouth, lips, or tongue. ? Tremors or shakes. ? Irritability.  Keep a rapid-acting carbohydrate snack available before, during, and after exercise to help prevent or treat hypoglycemia.  Avoid injecting insulin into areas of the body that are going to be exercised. For example, avoid injecting insulin into: ? The arms, when playing tennis. ? The legs, when jogging.  Keep records of your exercise habits. Doing this can help you and your health care provider adjust your diabetes management plan as needed. Write down: ? Food that you eat before and after you exercise. ? Blood glucose levels before and after you exercise. ? The type and amount of exercise you have done. ? When your insulin is expected to peak, if you use insulin. Avoid exercising at times when your insulin is  peaking.  When you start a new exercise or activity, work with your health care provider to make sure the activity is safe for you, and to adjust your insulin, medicines, or food intake as needed.  Drink plenty of water while you exercise to prevent dehydration or heat stroke. Drink enough fluid to keep your urine clear or pale yellow. Summary  Exercising regularly is important for your overall health, especially when you have diabetes (diabetes mellitus).  Exercising has many health benefits, such as increasing muscle strength and bone density and reducing body fat and stress.  Your health care provider or certified diabetes educator can help you make a plan for the type and frequency of exercise (activity plan) that works for you.  When you start a new exercise or activity, work with your health care provider to make sure the activity is safe for you, and to adjust your insulin, medicines, or food intake as needed. This information is not intended to replace advice given to you by your health care provider. Make sure you discuss any questions you have with your health care provider. Document Released: 07/26/2003 Document Revised: 11/27/2016 Document Reviewed:  10/15/2015 Elsevier Patient Education  [place].  Gastroparesis  Gastroparesis is a condition in which food takes longer than normal to empty from the stomach. The condition is usually long-lasting (chronic). It may also be called delayed gastric emptying. There is no cure, but there are treatments and things that you can do at home to help relieve symptoms. Treating the underlying condition that causes gastroparesis can also help relieve symptoms. What are the causes? In many cases, the cause of this condition is not known. Possible causes include:  A hormone (endocrine) disorder, such as hypothyroidism or diabetes.  A nervous system disease, such as Parkinson's disease or multiple sclerosis.  Cancer, infection, or  surgery that affects the stomach or vagus nerve. The vagus nerve runs from your chest, through your neck, to the lower part of your brain.  A connective tissue disorder, such as scleroderma.  Certain medicines. What increases the risk? You are more likely to develop this condition if you:  Have certain disorders or diseases, including: ? An endocrine disorder. ? An eating disorder. ? Amyloidosis. ? Scleroderma. ? Parkinson's disease. ? Multiple sclerosis. ? Cancer or infection of the stomach or the vagus nerve.  Have had surgery on the stomach or vagus nerve.  Take certain medicines.  Are female. What are the signs or symptoms? Symptoms of this condition include:  Feeling full after eating very little.  Nausea.  Vomiting.  Heartburn.  Abdominal bloating.  Inconsistent blood sugar (glucose) levels on blood tests.  Lack of appetite.  Weight loss.  Acid from the stomach coming up into the esophagus (gastroesophageal reflux).  Sudden tightening (spasm) of the stomach, which can be painful. Symptoms may come and go. Some people may not notice any symptoms. How is this diagnosed? This condition is diagnosed with tests, such as:  Tests that check how long it takes food to move through the stomach and intestines. These tests include: ? Upper gastrointestinal (GI) series. For this test, you drink a liquid that shows up well on X-rays, and then X-rays will be taken of your intestines. ? Gastric emptying scintigraphy. For this test, you eat food that contains a small amount of radioactive material, and then scans are taken. ? Wireless capsule GI monitoring system. For this test, you swallow a pill (capsule) that records information about how foods and fluid move through your stomach.  Gastric manometry. For this test, a tube is passed down your throat and into your stomach to measure electrical and muscular activity.  Endoscopy. For this test, a long, thin tube is passed  down your throat and into your stomach to check for problems in your stomach lining.  Ultrasound. This test uses sound waves to create images of inside the body. This can help rule out gallbladder disease or pancreatitis as a cause of your symptoms. How is this treated? There is no cure for gastroparesis. Treatment may include:  Treating the underlying cause.  Managing your symptoms by making changes to your diet and exercise habits.  Taking medicines to control nausea and vomiting and to stimulate stomach muscles.  Getting food through a feeding tube in the hospital. This may be done in severe cases.  Having surgery to insert a device into your body that helps improve stomach emptying and control nausea and vomiting (gastric neurostimulator). Follow these instructions at home:  Take over-the-counter and prescription medicines only as told by your health care provider.  Follow instructions from your health care provider about eating or drinking restrictions.  Your health care provider may recommend that you: ? Eat smaller meals more often. ? Eat low-fat foods. ? Eat low-fiber forms of high-fiber foods. For example, eat cooked vegetables instead of raw vegetables. ? Have only liquid foods instead of solid foods. Liquid foods are easier to digest.  Drink enough fluid to keep your urine pale yellow.  Exercise as often as told by your health care provider.  Keep all follow-up visits as told by your health care provider. This is important. Contact a health care provider if you:  Notice that your symptoms do not improve with treatment.  Have new symptoms. Get help right away if you:  Have severe abdominal pain that does not improve with treatment.  Have nausea that is severe or does not go away.  Cannot drink fluids without vomiting. Summary  Gastroparesis is a chronic condition in which food takes longer than normal to empty from the stomach.  Symptoms include nausea,  vomiting, heartburn, abdominal bloating, and loss of appetite.  Eating smaller portions, and low-fat, low-fiber foods may help you manage your symptoms.  Get help right away if you have severe abdominal pain. This information is not intended to replace advice given to you by your health care provider. Make sure you discuss any questions you have with your health care provider. Document Released: 05/05/2005 Document Revised: 08/03/2017 Document Reviewed: 03/10/2017 Elsevier Patient Education  2020 [employer].

## 2019-05-12 NOTE — Progress Notes (Signed)
Subjective:    Patient ID: [name], female    DOB: 03-10-1941, 78 y.o.   MRN: [id number]  No chief complaint on file. Pt accompanied by her daughter.  HPI Patient was seen today for HFU.  Pt admitted 04-20-11/10/20 for cellulitis, UTI, sepsis.  Pt hypotensive, with CP and SOB in ED.  VQ scan and b/l LE dopplers negative.  O2 sats 95% on 2L via [place].  Pt deconditioned, worked with PT.  UCx with pansensitive Klebsiella.  Pt transitioned from Rocephin to Ancef x 10 d course.   Per pt she has been doing better since being at home.  States diarrhea has resolved.  C. diff cx was negative.  Has some difficulty getting in bed without assistance.   Working with [facility] PT.  Pt notes decreased appetite x months.  Eats small spoonfuls of food at a time, then feels full.  Pt states she is still retaining some fluid.  States when initially admitted to the hospital, she did not urinate x 3 days and gained 20+ lbs.  Pt notes improvement in LE edema and erythema.  On lasix 60 mg, not working as well as before.  Drinking 64 oz of fluids per day and monitoring sodium intake.  Pt using home O2 intermittently.    Past Medical History:  Diagnosis Date  . Acute on chronic diastolic (congestive) heart failure ([place])   . Anemia    iron deficiency anemia - secondary to blood loss ( chronic)   . Arthritis    endstage changes bilateral knees/bilateral ankles.   . Asthma   . Carotid artery occlusion   . Chronic fatigue   . Chronic kidney disease   . Closed left hip fracture ([place])   . Clotting disorder ([place])    pt denies this  . Contusion of left knee    due to fall 1/14.  [name] COPD (chronic obstructive pulmonary disease) (HCC)    pulmonary fibrosis  . Depression, reactive   . Diabetes mellitus    type II   . Diastolic dysfunction   . Difficulty in walking   . Family history of heart disease   . Generalized muscle weakness   . Gout   . High cholesterol   . History of falling   . Hypertension   .  Hypothyroidism   . Interstitial lung disease ([place])   . 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    . Morbid obesity ([place])   . Morbid obesity ([place])   . Neuromuscular disorder ([place])    diabetic neuropathy   . Normal coronary arteries    cardiac catheterization performed  10/31/14  . RA (rheumatoid arthritis) ([place])    has been off methotreaxte since 10/13.  [name] Rheumatoid arthritis ([place])   . Shortness of breath   . Spinal stenosis of lumbar region   . Thyroid disease   . Unspecified lack of coordination   . URI (upper respiratory infection)     Allergies  Allergen Reactions  . Codeine Swelling and Other (See Comments)    Facial swelling Chest pain and swelling in legs  Facial swelling  . Infliximab Anaphylaxis    "sent me into shock" "sent me into shock"  . Lisinopril Swelling    Face and neck swelling Face and neck swelling    ROS General: Denies fever, chills, night sweats, changes in weight. +deconditioning, decreased appetite. HEENT: Denies headaches, ear pain, changes  in vision, rhinorrhea, sore throat CV: Denies CP, palpitations, SOB, orthopnea Pulm: Denies SOB, cough, wheezing GI: Denies abdominal pain, nausea, vomiting, diarrhea, constipation GU: Denies dysuria, hematuria, frequency, vaginal discharge Msk: Denies muscle cramps, joint pains Neuro: Denies weakness, numbness, tingling Skin: Denies rashes, bruising  +LE cellulitis Psych: Denies depression, anxiety, hallucinations     Objective:    Blood pressure 138/72, pulse 96, temperature (!) 96.8 F (36 C), temperature source Temporal, weight 264 lb (119.7 kg), SpO2 94 %.  Gen. Pleasant, well-nourished, in no distress, normal affect   HEENT: [place]/AT, face symmetric, conjunctiva clear, no scleral icterus, PERRLA, EOMI, nares patent without drainage. Lungs: no accessory muscle use, CTAB, no wheezes or rales Cardiovascular:  RRR, no m/r/g, trace peripheral edema in R ankle.  No edema in L ankle, mild edema in L foot. Abdomen: BS present, soft, NT/ND. Musculoskeletal: No deformities, no cyanosis or clubbing, normal tone Neuro:  A&Ox3, CN II-XII intact, sitting in transport wheelchair Skin:  Warm, no lesions/ rash.  Discoloration of LEs at b/l shins to ankles, no erythema, induration, or increased warmth.  Wt Readings from Last 3 Encounters:  05/12/19 264 lb (119.7 kg)  04/28/19 286 lb 3.2 oz (129.8 kg)  04/04/19 262 lb (118.8 kg)    Lab Results  Component Value Date   WBC 7.3 05/12/2019   HGB 11.9 (L) 05/12/2019   HCT 37.4 05/12/2019   PLT 194.0 05/12/2019   GLUCOSE 137 (H) 05/12/2019   CHOL 150 05/26/2018   TRIG 111 05/26/2018   HDL 65 05/26/2018   LDLCALC 63 05/26/2018   ALT 36 04/21/2019   AST 33 04/21/2019   NA 145 05/12/2019   K 3.4 (L) 05/12/2019   CL 100 05/12/2019   CREATININE 0.78 05/12/2019   BUN 10 05/12/2019   CO2 37 (H) 05/12/2019   TSH 2.256 04/22/2019   INR 1.05 11/29/2016   HGBA1C 7.4 (H) 04/22/2019    Assessment/Plan: Hospital d/c f/u -14 days s/p d/c -TCM phone call made and reviewed. -hospital notes, labs, and imaging reviewed.  Chronic diastolic heart failure (HCC)  -improving -continue daily weights -continue fluid and sodium restrictions -continue current meds lasix 60 mg, Toprol XL , klor con -continue f/u with Cardiology - Plan: Basic Metabolic Panel, potassium chloride SA (KLOR-CON) 20 MEQ tablet  ILD (interstitial lung disease) ([place]) -stable -advised to use home O2, 2L -continue albuterol, prednisone 10 mg daily -continue incentive spirometer/acapella  -continue f/u with Pulmonology  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) -Hgb A1C 7.4% on 04/22/19 -continue ozempic 0.5 mg weekly, gabapentin -continue lifestyle modifications  Cellulitis of left lower extremity -resolving  -10 d abx course complete -per chart review no leukocytosis  noted during hospitalization -continue to monitor  - Plan: CBC (no diff)  Physical deconditioning -continue HH PT -encouraged to continue chair exercises -use walker to aid with ambulation -6 MWT done.  No desats <90% noted. -Plan: DME hospital bed.  Acute kidney injury superimposed on chronic kidney disease ([place])  -creatinine 2.75 on 12/4 and 0.8-0.9 at d/c.   -Baseline creatinine 1.teens to 1.20.   -will recheck renal function - Plan: Basic Metabolic Panel   Update: Potassium low at 3.4 on labs.  Will increase Kdur to 20 mEq daily.  New rx sent in.  F/u prn in the next 1 month  Grier Mitts, MD

## 2019-05-16 ENCOUNTER — Telehealth: Payer: Self-pay

## 2019-05-16 NOTE — Telephone Encounter (Signed)
Pt can put a small amount of neosporin or vaseline on her legs.  Pt should be elevating her legs with sitting.  Pt will likely have itching in legs as the swelling reduces.

## 2019-05-16 NOTE — Telephone Encounter (Signed)
Spoke with pt states that her legs are very itchy and dry, pt states that she was told not to put any ointment on because of infection, she says she has noticed dark spots and that the swelling is still there. Pt request for advise on what she needs for the itch.

## 2019-05-16 NOTE — Telephone Encounter (Signed)
Copied from [place] [id number]. Topic: General - Other >> May 16, 2019  2:56 PM Rainey Pines A wrote: Patient would like to speak with Dr. Volanda Napoleon nurse about her legs and feet  itching. Patient has finished antibiotics but legs and feet are still itching and haven't improved.

## 2019-05-17 DIAGNOSIS — N390 Urinary tract infection, site not specified: Secondary | ICD-10-CM | POA: Diagnosis not present

## 2019-05-17 DIAGNOSIS — L03115 Cellulitis of right lower limb: Secondary | ICD-10-CM | POA: Diagnosis not present

## 2019-05-17 DIAGNOSIS — B961 Klebsiella pneumoniae [K. pneumoniae] as the cause of diseases classified elsewhere: Secondary | ICD-10-CM | POA: Diagnosis not present

## 2019-05-17 DIAGNOSIS — L03116 Cellulitis of left lower limb: Secondary | ICD-10-CM | POA: Diagnosis not present

## 2019-05-17 DIAGNOSIS — I5032 Chronic diastolic (congestive) heart failure: Secondary | ICD-10-CM | POA: Diagnosis not present

## 2019-05-17 DIAGNOSIS — I130 Hypertensive heart and chronic kidney disease with heart failure and stage 1 through stage 4 chronic kidney disease, or unspecified chronic kidney disease: Secondary | ICD-10-CM | POA: Diagnosis not present

## 2019-05-18 NOTE — Telephone Encounter (Signed)
Spoke with pt verbalized understanding of Dr Cain Saupe advise and recommendation

## 2019-05-23 NOTE — Telephone Encounter (Signed)
Nothing further needed

## 2019-05-24 ENCOUNTER — Other Ambulatory Visit: Payer: Self-pay

## 2019-05-24 ENCOUNTER — Encounter: Payer: Self-pay | Admitting: Podiatry

## 2019-05-24 ENCOUNTER — Ambulatory Visit (INDEPENDENT_AMBULATORY_CARE_PROVIDER_SITE_OTHER): Payer: Medicare Other | Admitting: Podiatry

## 2019-05-24 DIAGNOSIS — E1142 Type 2 diabetes mellitus with diabetic polyneuropathy: Secondary | ICD-10-CM

## 2019-05-24 DIAGNOSIS — D689 Coagulation defect, unspecified: Secondary | ICD-10-CM

## 2019-05-24 DIAGNOSIS — B351 Tinea unguium: Secondary | ICD-10-CM

## 2019-05-24 DIAGNOSIS — M79676 Pain in unspecified toe(s): Secondary | ICD-10-CM

## 2019-05-24 DIAGNOSIS — E1140 Type 2 diabetes mellitus with diabetic neuropathy, unspecified: Secondary | ICD-10-CM | POA: Insufficient documentation

## 2019-05-24 NOTE — Progress Notes (Signed)
Complaint:  Visit Type: Patient returns to my office for continued preventative foot care services. Complaint: Patient states" my nails have grown long and thick and become painful to walk and wear shoes" Patient has been diagnosed with DM with no foot complications. The patient presents for preventative foot care services.  She has been in the hospital for infection/cellulitis which has resolved.  She has significant peeling skin on the bottom of both feet extending from heels to toes.  Podiatric Exam: Vascular: dorsalis pedis  are palpable bilateral.  Posterior tibial pulses are absent secondary to swelling  B/L. Capillary return is immediate. Temperature gradient is WNL. Skin turgor WNL  Sensorium: Normal Semmes Weinstein monofilament test right foot.  LOPS diminished/absent left foot.. Normal tactile sensation bilaterally. Nail Exam: Pt has thick disfigured discolored nails with subungual debris noted bilateral entire nail hallux through fifth toenails Ulcer Exam: There is no evidence of ulcer or pre-ulcerative changes or infection. Orthopedic Exam: Muscle tone and strength are WNL. No limitations in general ROM. No crepitus or effusions noted. Foot type and digits show no abnormalities. HAV 1st MPJ right foot.  Plantar flexed fifth metatarsal left foot. Skin: No Porokeratosis. No infection or ulcers.  Callus sub 5th met left foot. Peeling plantar skin  B/L.  Diagnosis:  Onychomycosis, , Pain in right toe, pain in left toes  Treatment & Plan Procedures and Treatment: Consent by patient was obtained for treatment procedures.   Debridement of mycotic and hypertrophic toenails, 1 through 5 bilateral and clearing of subungual debris. No ulceration, no infection noted. Patient qualifies for diabetic shoes due to DPN, HAV and plantar flexed metatarsal left foot.  Patient to make an appointment with Liliane Channel. Return Visit-Office Procedure: Patient instructed to return to the office for a follow up visit 3  months for continued evaluation and treatment.    Gardiner Barefoot DPM

## 2019-05-25 DIAGNOSIS — I5032 Chronic diastolic (congestive) heart failure: Secondary | ICD-10-CM | POA: Diagnosis not present

## 2019-05-25 DIAGNOSIS — L03115 Cellulitis of right lower limb: Secondary | ICD-10-CM | POA: Diagnosis not present

## 2019-05-25 DIAGNOSIS — N390 Urinary tract infection, site not specified: Secondary | ICD-10-CM | POA: Diagnosis not present

## 2019-05-25 DIAGNOSIS — I130 Hypertensive heart and chronic kidney disease with heart failure and stage 1 through stage 4 chronic kidney disease, or unspecified chronic kidney disease: Secondary | ICD-10-CM | POA: Diagnosis not present

## 2019-05-25 DIAGNOSIS — L03116 Cellulitis of left lower limb: Secondary | ICD-10-CM | POA: Diagnosis not present

## 2019-05-25 DIAGNOSIS — B961 Klebsiella pneumoniae [K. pneumoniae] as the cause of diseases classified elsewhere: Secondary | ICD-10-CM | POA: Diagnosis not present

## 2019-05-26 ENCOUNTER — Telehealth: Payer: Self-pay | Admitting: Internal Medicine

## 2019-05-26 DIAGNOSIS — L03116 Cellulitis of left lower limb: Secondary | ICD-10-CM | POA: Diagnosis not present

## 2019-05-26 DIAGNOSIS — N390 Urinary tract infection, site not specified: Secondary | ICD-10-CM | POA: Diagnosis not present

## 2019-05-26 DIAGNOSIS — I5032 Chronic diastolic (congestive) heart failure: Secondary | ICD-10-CM | POA: Diagnosis not present

## 2019-05-26 DIAGNOSIS — I130 Hypertensive heart and chronic kidney disease with heart failure and stage 1 through stage 4 chronic kidney disease, or unspecified chronic kidney disease: Secondary | ICD-10-CM | POA: Diagnosis not present

## 2019-05-26 DIAGNOSIS — J9611 Chronic respiratory failure with hypoxia: Secondary | ICD-10-CM

## 2019-05-26 DIAGNOSIS — L03115 Cellulitis of right lower limb: Secondary | ICD-10-CM | POA: Diagnosis not present

## 2019-05-26 DIAGNOSIS — B961 Klebsiella pneumoniae [K. pneumoniae] as the cause of diseases classified elsewhere: Secondary | ICD-10-CM | POA: Diagnosis not present

## 2019-05-26 NOTE — Telephone Encounter (Signed)
Called and spoke to [facility] with [employer] and was advised pt will need an ONO and an OV for insurance. Pt is no longer on daytime O2 so she will not need a walk. Called and spoke to pt. Made appt with MR for 06/21/2019 for 3 month follow up and to review the ONO and state the nocturnal O2 needs for insurance.   [name], please advise if ok to place order for ONO on RA. Thanks.

## 2019-05-27 NOTE — Telephone Encounter (Signed)
Ok to order [employer]

## 2019-05-30 DIAGNOSIS — N1832 Chronic kidney disease, stage 3b: Secondary | ICD-10-CM | POA: Diagnosis not present

## 2019-05-30 DIAGNOSIS — J479 Bronchiectasis, uncomplicated: Secondary | ICD-10-CM | POA: Diagnosis not present

## 2019-05-30 DIAGNOSIS — Z7952 Long term (current) use of systemic steroids: Secondary | ICD-10-CM | POA: Diagnosis not present

## 2019-05-30 DIAGNOSIS — M1030 Gout due to renal impairment, unspecified site: Secondary | ICD-10-CM | POA: Diagnosis not present

## 2019-05-30 DIAGNOSIS — Z792 Long term (current) use of antibiotics: Secondary | ICD-10-CM | POA: Diagnosis not present

## 2019-05-30 DIAGNOSIS — R918 Other nonspecific abnormal finding of lung field: Secondary | ICD-10-CM | POA: Diagnosis not present

## 2019-05-30 DIAGNOSIS — M069 Rheumatoid arthritis, unspecified: Secondary | ICD-10-CM | POA: Diagnosis not present

## 2019-05-30 DIAGNOSIS — M1990 Unspecified osteoarthritis, unspecified site: Secondary | ICD-10-CM | POA: Diagnosis not present

## 2019-05-30 DIAGNOSIS — J849 Interstitial pulmonary disease, unspecified: Secondary | ICD-10-CM | POA: Diagnosis not present

## 2019-05-30 DIAGNOSIS — L03115 Cellulitis of right lower limb: Secondary | ICD-10-CM | POA: Diagnosis not present

## 2019-05-30 DIAGNOSIS — K219 Gastro-esophageal reflux disease without esophagitis: Secondary | ICD-10-CM | POA: Diagnosis not present

## 2019-05-30 DIAGNOSIS — Z79891 Long term (current) use of opiate analgesic: Secondary | ICD-10-CM | POA: Diagnosis not present

## 2019-05-30 DIAGNOSIS — E1122 Type 2 diabetes mellitus with diabetic chronic kidney disease: Secondary | ICD-10-CM | POA: Diagnosis not present

## 2019-05-30 DIAGNOSIS — N281 Cyst of kidney, acquired: Secondary | ICD-10-CM | POA: Diagnosis not present

## 2019-05-30 DIAGNOSIS — Z9981 Dependence on supplemental oxygen: Secondary | ICD-10-CM | POA: Diagnosis not present

## 2019-05-30 DIAGNOSIS — I5032 Chronic diastolic (congestive) heart failure: Secondary | ICD-10-CM | POA: Diagnosis not present

## 2019-05-30 DIAGNOSIS — F329 Major depressive disorder, single episode, unspecified: Secondary | ICD-10-CM | POA: Diagnosis not present

## 2019-05-30 DIAGNOSIS — E1142 Type 2 diabetes mellitus with diabetic polyneuropathy: Secondary | ICD-10-CM | POA: Diagnosis not present

## 2019-05-30 DIAGNOSIS — L03116 Cellulitis of left lower limb: Secondary | ICD-10-CM | POA: Diagnosis not present

## 2019-05-30 DIAGNOSIS — Z79899 Other long term (current) drug therapy: Secondary | ICD-10-CM | POA: Diagnosis not present

## 2019-05-30 DIAGNOSIS — E039 Hypothyroidism, unspecified: Secondary | ICD-10-CM | POA: Diagnosis not present

## 2019-05-30 DIAGNOSIS — B961 Klebsiella pneumoniae [K. pneumoniae] as the cause of diseases classified elsewhere: Secondary | ICD-10-CM | POA: Diagnosis not present

## 2019-05-30 DIAGNOSIS — N390 Urinary tract infection, site not specified: Secondary | ICD-10-CM | POA: Diagnosis not present

## 2019-05-30 DIAGNOSIS — I130 Hypertensive heart and chronic kidney disease with heart failure and stage 1 through stage 4 chronic kidney disease, or unspecified chronic kidney disease: Secondary | ICD-10-CM | POA: Diagnosis not present

## 2019-05-30 NOTE — Telephone Encounter (Signed)
Per [name] ONO on room air has been ordered.

## 2019-05-31 ENCOUNTER — Other Ambulatory Visit: Payer: Self-pay

## 2019-05-31 ENCOUNTER — Ambulatory Visit: Payer: Medicare Other | Admitting: Orthotics

## 2019-05-31 DIAGNOSIS — M1000 Idiopathic gout, unspecified site: Secondary | ICD-10-CM

## 2019-05-31 DIAGNOSIS — E1142 Type 2 diabetes mellitus with diabetic polyneuropathy: Secondary | ICD-10-CM

## 2019-05-31 DIAGNOSIS — D689 Coagulation defect, unspecified: Secondary | ICD-10-CM

## 2019-05-31 NOTE — Progress Notes (Signed)
Patient was measured for med necessity extra depth shoes (x2) w/ 3 pair (x6) custom f/o. Patient was measured w/ brannock to determine size and width.  Foam impression mold was achieved and deemed appropriate for fabrication of  cmfo.   See DPM chart notes for further documentation and dx codes for determination of medical necessity.  Appropriate forms will be sent to PCP to verify and sign off on medical necessity.

## 2019-06-01 ENCOUNTER — Other Ambulatory Visit: Payer: Self-pay

## 2019-06-01 ENCOUNTER — Encounter: Payer: Self-pay | Admitting: Physical Medicine & Rehabilitation

## 2019-06-01 ENCOUNTER — Encounter: Payer: Medicare Other | Attending: Physical Medicine & Rehabilitation | Admitting: Physical Medicine & Rehabilitation

## 2019-06-01 DIAGNOSIS — Z5181 Encounter for therapeutic drug level monitoring: Secondary | ICD-10-CM | POA: Insufficient documentation

## 2019-06-01 DIAGNOSIS — I5032 Chronic diastolic (congestive) heart failure: Secondary | ICD-10-CM | POA: Diagnosis not present

## 2019-06-01 DIAGNOSIS — L03116 Cellulitis of left lower limb: Secondary | ICD-10-CM | POA: Diagnosis not present

## 2019-06-01 DIAGNOSIS — L03115 Cellulitis of right lower limb: Secondary | ICD-10-CM | POA: Diagnosis not present

## 2019-06-01 DIAGNOSIS — Z79891 Long term (current) use of opiate analgesic: Secondary | ICD-10-CM | POA: Insufficient documentation

## 2019-06-01 DIAGNOSIS — M1711 Unilateral primary osteoarthritis, right knee: Secondary | ICD-10-CM | POA: Diagnosis not present

## 2019-06-01 DIAGNOSIS — M069 Rheumatoid arthritis, unspecified: Secondary | ICD-10-CM | POA: Insufficient documentation

## 2019-06-01 DIAGNOSIS — M48062 Spinal stenosis, lumbar region with neurogenic claudication: Secondary | ICD-10-CM | POA: Insufficient documentation

## 2019-06-01 DIAGNOSIS — G894 Chronic pain syndrome: Secondary | ICD-10-CM | POA: Diagnosis not present

## 2019-06-01 DIAGNOSIS — Z9889 Other specified postprocedural states: Secondary | ICD-10-CM | POA: Diagnosis not present

## 2019-06-01 DIAGNOSIS — F329 Major depressive disorder, single episode, unspecified: Secondary | ICD-10-CM | POA: Insufficient documentation

## 2019-06-01 DIAGNOSIS — E1142 Type 2 diabetes mellitus with diabetic polyneuropathy: Secondary | ICD-10-CM | POA: Insufficient documentation

## 2019-06-01 DIAGNOSIS — Z90710 Acquired absence of both cervix and uterus: Secondary | ICD-10-CM | POA: Insufficient documentation

## 2019-06-01 DIAGNOSIS — M7062 Trochanteric bursitis, left hip: Secondary | ICD-10-CM | POA: Diagnosis not present

## 2019-06-01 DIAGNOSIS — E1122 Type 2 diabetes mellitus with diabetic chronic kidney disease: Secondary | ICD-10-CM | POA: Diagnosis not present

## 2019-06-01 DIAGNOSIS — B961 Klebsiella pneumoniae [K. pneumoniae] as the cause of diseases classified elsewhere: Secondary | ICD-10-CM | POA: Diagnosis not present

## 2019-06-01 DIAGNOSIS — N390 Urinary tract infection, site not specified: Secondary | ICD-10-CM | POA: Diagnosis not present

## 2019-06-01 DIAGNOSIS — M47816 Spondylosis without myelopathy or radiculopathy, lumbar region: Secondary | ICD-10-CM

## 2019-06-01 DIAGNOSIS — M1712 Unilateral primary osteoarthritis, left knee: Secondary | ICD-10-CM | POA: Diagnosis not present

## 2019-06-01 DIAGNOSIS — I130 Hypertensive heart and chronic kidney disease with heart failure and stage 1 through stage 4 chronic kidney disease, or unspecified chronic kidney disease: Secondary | ICD-10-CM | POA: Diagnosis not present

## 2019-06-01 MED ORDER — MORPHINE SULFATE ER 15 MG PO TBCR: 15.0000 mg | EXTENDED_RELEASE_TABLET | Freq: Two times a day (BID) | ORAL | 0 refills | Status: DC

## 2019-06-01 MED ORDER — MORPHINE SULFATE 15 MG PO TABS: 15.0000 mg | ORAL_TABLET | Freq: Two times a day (BID) | ORAL | 0 refills | Status: DC | PRN

## 2019-06-01 NOTE — Patient Instructions (Signed)
COVID-19 Vaccine Information can be found at: [email or web address] For questions related to vaccine distribution or appointments, please email [email or web address] or call [phone].    

## 2019-06-01 NOTE — Progress Notes (Signed)
PROCEDURE NOTE  DIAGNOSIS:  OA of right knee INTERVENTION:   ZILRETTA INJECTION     After informed consent and preparation of the skin with betadine and isopropyl alcohol, I injected 32MG of zilretta dissolved into 5cc of diluent into the right knee via anterolateral approach. Contents of syring were shaken vigorously and aspiration was performed prior to injection. The patient tolerated well, and no complications were encountered. Afterward the area was cleaned and dressed. Post- injection instructions were provided including ice  if swelling or pain should occur.   Refilled MS IR and MS CR today for this month and next.     Meredith Staggers, MD, [facility] 06/01/2019

## 2019-06-02 ENCOUNTER — Other Ambulatory Visit: Payer: Medicare Other | Admitting: Nurse Practitioner

## 2019-06-02 ENCOUNTER — Encounter: Payer: Self-pay | Admitting: Nurse Practitioner

## 2019-06-02 DIAGNOSIS — Z515 Encounter for palliative care: Secondary | ICD-10-CM

## 2019-06-02 DIAGNOSIS — M069 Rheumatoid arthritis, unspecified: Secondary | ICD-10-CM | POA: Diagnosis not present

## 2019-06-02 NOTE — Progress Notes (Signed)
[facility] Consult Note Telephone: [phone]  Fax: [phone]  PATIENT NAME: [name] DOB: 05/09/1941 MRN: [id number]  PRIMARY CARE PROVIDER:   [name], MD  REFERRING PROVIDER:  [name], MD [place],  [place] [address]  RESPONSIBLE PARTY:   Self; [phone]  Due to the COVID-19 crisis, this visit was done via telemedicine from my office and it was initiated and consent by this patient and or family.  RECOMMENDATIONS and PLAN:  1. ACP: full code currently with aggressive interventions.   2. RA; continue current regimen, pain management  3. Palliative care encounter Palliative medicine team will continue to support patient, patient's family, and medical team. Visit consisted of counseling and education dealing with the complex and emotionally intense issues of symptom management and palliative care in the setting of serious and potentially life-threatening illness  I spent 45 minutes providing this consultation,  from 2:00pm to 2:45pm. More than 50% of the time in this consultation was spent coordinating communication.   HISTORY OF PRESENT ILLNESS:  [name] is a 79 y.o. year old female with multiple medical problems including Chronic diastolic congestive heart failure, pulmonary fibrosis, interstitial lung disease, chronic respiratory failure with hypoxia, dysphasia, aphasia, carotid artery occlusion, COPD, history of DVT, chronic kidney disease, Rheumatoid arthritis, anemia, meningioma of left sphenoid wing involving cavernous sinus, hypothyroidism, hypertension, gerd, spinal stenosis of lumbar region, osteoarthritis, hypercholesterolemia, gout, morbid obesity, arthritis, cataract, chronic pain, history of left hip fracture s/p nail, tonsillectomy, left shoulder surgery, ovarian surgery, brain surgery, abdominal hysterectomy. Hospitalize 12 / 3 / 2020 to 12 / 10 / 2020 for  shortness of breath, chest pain and [name] hypertensive with bilateral lower extremity cellulitis, sepsis with your own culture is growing klebsiella, acute kidney injury on chronic kidney disease stage 3. Nonspecific diarrhea c.difficile negative. Chronic diastolic congestive heart failure though no evidence of fluid overload. Interstitial lung disease with chronic hypoxia, oxygen at 2 liters taking daily prednisone and Plaquenil for rheumatoid arthritis. [name] was discharged home though recommendation was to go to a short-term nursing facility for rehab. [name] was discharged home with Home Health, PT. I called [name] for scheduled follow-up telemedicine telephonic is video not available palliative care follow-up visit. [name] in agreement. We talked about how she was feeling. [name] endorses she is doing much better. We talked about home health which is now completed. [name] in agreement. We talked about how she was feeling. [name] endorses she doing much better. We talked to that recent hospitalization. We talked about discharged home and the work she was doing with [place] which has been completed. Joan. Mcdaniel does have a few more sessions with physical therapy. Joan. Mcdaniel endorses she was very proud that she's been able to walk into her doctors offices yesterday and the day before with her walker and not having to use her wheelchair. [name] endorses she feels like every day getting stronger. Her goal is to walk more efficiently with her walker. No falls this week. [name] endorses therapy has about two more sessions. We talked about symptoms of pain, shortness of breath. We talked about her oxygen in hopes that eventually will be able to be on room air. We talked about we talked about quality of life. [name] endorses that she is actually now hired some extra help at home for her. [name] endorses this has helped tremendously as  made it easier for her to have  her meals cooked. We talked about her appetite as she has lost a significant amount of weight since hospitalization. [name] endorses that she has decreased her amount of food that she eats each day and she makes healthier choices. [name] is eating three meals a day. We reviewed her food list. We talked about medical goals of care to continue aggressive interventions and full code. Hopes are to continue to improve. [name] goal is to decrease the amount of medications that she is on using weight loss in addition to healthier lifestyle habits. [name] she feels like she is accomplishing this one small step at a time. We talked about role palliative care and plan of care. We talked about follow up visit in 6 weeks if needed or sooner should she declined. [name] in agreement. Therapeutic listening and emotional support provided. Praised [name] for her efforts and all of her hard work. Questions answered to satisfaction. Appointment schedule. Contact information provided. Palliative Care was asked to help to continue to address goals of care.   CODE STATUS: Full code   PPS: 50% HOSPICE ELIGIBILITY/DIAGNOSIS: TBD  PAST MEDICAL HISTORY:  Past Medical History:  Diagnosis Date  . Acute on chronic diastolic (congestive) heart failure ([place])   . Anemia    iron deficiency anemia - secondary to blood loss ( chronic)   . Arthritis    endstage changes bilateral knees/bilateral ankles.   . Asthma   . Carotid artery occlusion   . Chronic fatigue   . Chronic kidney disease   . Closed left hip fracture ([place])   . Clotting disorder ([facility])    pt denies this  . Contusion of left knee    due to fall 1/14.  [name] COPD (chronic obstructive pulmonary disease) (HCC)    pulmonary fibrosis  . Depression, reactive   . Diabetes mellitus    type II   . Diastolic dysfunction   . Difficulty in walking   . Family history of heart disease   . Generalized muscle weakness   . Gout   . High cholesterol    . History of falling   . Hypertension   . Hypothyroidism   . Interstitial lung disease ([place])   . 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    . Morbid obesity ([place])   . Morbid obesity ([place])   . Neuromuscular disorder ([place])    diabetic neuropathy   . Normal coronary arteries    cardiac catheterization performed  10/31/14  . RA (rheumatoid arthritis) ([place])    has been off methotreaxte since 10/13.  [name] Rheumatoid arthritis ([place])   . Shortness of breath   . Spinal stenosis of lumbar region   . Thyroid disease   . Unspecified lack of coordination   . URI (upper respiratory infection)     SOCIAL HX:  Social History   Tobacco Use  . Smoking status: Never Smoker  . Smokeless tobacco: Never Used  Substance Use Topics  . Alcohol use: No    ALLERGIES:  Allergies  Allergen Reactions  . Codeine Swelling and Other (See Comments)    Facial swelling Chest pain and swelling in legs  Facial swelling  . Infliximab Anaphylaxis    "sent me into shock" "sent me into shock"  . Lisinopril Swelling    Face and neck swelling Face and neck swelling  PERTINENT MEDICATIONS:  Outpatient Encounter Medications as of 06/02/2019  Medication Sig  . acetaminophen (TYLENOL) 500 MG tablet Take 1,000 mg by mouth every 6 (six) hours as needed for moderate pain.   [name] albuterol (PROVENTIL HFA;VENTOLIN HFA) 108 (90 BASE) MCG/ACT inhaler Inhale 2 puffs into the lungs every 6 (six) hours as needed for wheezing or shortness of breath.  . allopurinol (ZYLOPRIM) 300 MG tablet Take 300 mg by mouth daily.  [name] aspirin buffered (BUFFERIN) 325 MG TABS tablet Take 325 mg by mouth daily.  [name] atorvastatin (LIPITOR) 20 MG tablet Take 10 mg by mouth every evening.   [name] azithromycin (ZITHROMAX) 500 MG tablet Take 1 tablet by mouth 3 (three) times a week. TAKE ON MON, WED & FRI  . Calcium Carb-Cholecalciferol (CALCIUM + D3 PO) Take 2  tablets by mouth daily with lunch.   . cholecalciferol (VITAMIN D) 1000 UNITS tablet Take 2,000 Units by mouth daily with lunch.   . colchicine 0.6 MG tablet Take 0.6 mg by mouth daily.  . cyclobenzaprine (FLEXERIL) 5 MG tablet Take 1 tablet (5 mg total) by mouth at bedtime as needed for muscle spasms. (Patient taking differently: Take 5 mg by mouth at bedtime. )  . diclofenac sodium (VOLTAREN) 1 % GEL Apply 2 g topically 3 (three) times daily. Both hands and knees  . DULoxetine (CYMBALTA) 60 MG capsule Take 1 capsule (60 mg total) by mouth daily.  . fluticasone (FLONASE) 50 MCG/ACT nasal spray Place 2 sprays into both nostrils daily.  . furosemide (LASIX) 20 MG tablet Take 3 tablets (60 mg total) by mouth daily.  [name] gabapentin (NEURONTIN) 600 MG tablet Take two in the morning , Two mid- afternoon and two at bedtime (Patient taking differently: Take 1,200 mg by mouth 3 (three) times daily. Take two in the morning , Two mid- afternoon and two at bedtime)  . guaiFENesin (MUCINEX) 600 MG 12 hr tablet Take 1,200 mg by mouth 2 (two) times daily.  . hydroxychloroquine (PLAQUENIL) 200 MG tablet Take 400 mg by mouth daily.   [name] ipratropium-albuterol (DUONEB) 0.5-2.5 (3) MG/3ML SOLN Take 3 mLs by nebulization every 6 (six) hours as needed (cough/wheezing).   [name] leflunomide (ARAVA) 10 MG tablet Take 10 mg by mouth daily.  [name] levothyroxine (SYNTHROID) 50 MCG tablet Take 1 tablet (50 mcg total) by mouth daily.  [name] linaclotide (LINZESS) 72 MCG capsule Take 1 capsule (72 mcg total) by mouth daily before breakfast.  . metoprolol succinate (TOPROL XL) 100 MG 24 hr tablet Take 1/2 tablet by mouth daily with or immediately following a meal.  . morphine (Joan CONTIN) 15 MG 12 hr tablet Take 1 tablet (15 mg total) by mouth every 12 (twelve) hours.  [name] morphine (MSIR) 15 MG tablet Take 1 tablet (15 mg total) by mouth 2 (two) times daily as needed for moderate pain ([facility]).  . Multiple Vitamin (MULTIVITAMIN WITH  MINERALS) TABS Take 1 tablet by mouth daily.   [name] olmesartan (BENICAR) 40 MG tablet Take 1 tablet (40 mg total) by mouth daily.  . OXYGEN-HELIUM IN Inhale 2 L into the lungs at bedtime. And on exertion  . pantoprazole (PROTONIX) 40 MG tablet take 1 tablet by mouth once daily MINUTES BEFORE 1ST MEAL OF THE DAY  . potassium chloride SA (KLOR-CON) 20 MEQ tablet Take 1 tablet (20 mEq total) by mouth daily.  . predniSONE (DELTASONE) 5 MG tablet Take 10 mg by mouth every morning.   . Semaglutide,0.25 or 0.5MG/DOS, (OZEMPIC, 0.25  OR 0.5 MG/DOSE,) 2 MG/1.5ML SOPN Inject 0.5 mg into the skin once a week.  . sodium chloride (OCEAN) 0.65 % SOLN nasal spray Place 1 spray into both nostrils as needed for congestion.  [name] Spacer/Aero-Holding Chambers (AEROCHAMBER MV) inhaler Use as instructed   No facility-administered encounter medications on file as of 06/02/2019.    PHYSICAL EXAM:  Deferred  Merial Moritz Z Sheddrick Lattanzio, NP

## 2019-06-07 DIAGNOSIS — J449 Chronic obstructive pulmonary disease, unspecified: Secondary | ICD-10-CM | POA: Diagnosis not present

## 2019-06-07 DIAGNOSIS — R0902 Hypoxemia: Secondary | ICD-10-CM | POA: Diagnosis not present

## 2019-06-08 DIAGNOSIS — B961 Klebsiella pneumoniae [K. pneumoniae] as the cause of diseases classified elsewhere: Secondary | ICD-10-CM | POA: Diagnosis not present

## 2019-06-08 DIAGNOSIS — N390 Urinary tract infection, site not specified: Secondary | ICD-10-CM | POA: Diagnosis not present

## 2019-06-08 DIAGNOSIS — L03115 Cellulitis of right lower limb: Secondary | ICD-10-CM | POA: Diagnosis not present

## 2019-06-08 DIAGNOSIS — I130 Hypertensive heart and chronic kidney disease with heart failure and stage 1 through stage 4 chronic kidney disease, or unspecified chronic kidney disease: Secondary | ICD-10-CM | POA: Diagnosis not present

## 2019-06-08 DIAGNOSIS — L03116 Cellulitis of left lower limb: Secondary | ICD-10-CM | POA: Diagnosis not present

## 2019-06-08 DIAGNOSIS — I5032 Chronic diastolic (congestive) heart failure: Secondary | ICD-10-CM | POA: Diagnosis not present

## 2019-06-10 ENCOUNTER — Telehealth: Payer: Self-pay | Admitting: Family Medicine

## 2019-06-10 ENCOUNTER — Other Ambulatory Visit: Payer: Self-pay

## 2019-06-10 MED ORDER — PANTOPRAZOLE SODIUM 40 MG PO TBEC: DELAYED_RELEASE_TABLET | ORAL | 1 refills | Status: DC

## 2019-06-10 NOTE — Telephone Encounter (Signed)
Rx sent 

## 2019-06-10 NOTE — Telephone Encounter (Signed)
Pt call and want new RX for pantoprazole 40 mg tb sent to [facility]

## 2019-06-13 DIAGNOSIS — R29898 Other symptoms and signs involving the musculoskeletal system: Secondary | ICD-10-CM | POA: Diagnosis not present

## 2019-06-13 DIAGNOSIS — M545 Low back pain: Secondary | ICD-10-CM | POA: Diagnosis not present

## 2019-06-14 ENCOUNTER — Telehealth: Payer: Self-pay | Admitting: Internal Medicine

## 2019-06-14 NOTE — Telephone Encounter (Signed)
Called and spoke with pt letting her know that MR said to continue 2L O2 at night and she verbalized understanding. Nothing further needed.

## 2019-06-14 NOTE — Telephone Encounter (Signed)
Called and spoke with pt letting her know the results of the ONO. When stated to pt that MR said that she could start on 1L QHS, pt stated to me that prior to [place] doing the ONO on room air, she had already had O2 that she usually wears at night. Pt said that she wears 2L QHS.  MR, please advise if you want to keep pt wearing her 2L QHS as she has been doing?

## 2019-06-14 NOTE — Telephone Encounter (Signed)
ono 06/07/2019 on room air-> pulse ox < 88% for 13 min and 28 secon while pulse ox < 89% for 24 min  Plan  - can start 1L [place] QHS - we can do this now or wait till she comes for office visit in a week

## 2019-06-14 NOTE — Telephone Encounter (Signed)
Yeah ok to continue 2L [place] QHS

## 2019-06-15 DIAGNOSIS — I130 Hypertensive heart and chronic kidney disease with heart failure and stage 1 through stage 4 chronic kidney disease, or unspecified chronic kidney disease: Secondary | ICD-10-CM | POA: Diagnosis not present

## 2019-06-15 DIAGNOSIS — L03116 Cellulitis of left lower limb: Secondary | ICD-10-CM | POA: Diagnosis not present

## 2019-06-15 DIAGNOSIS — B961 Klebsiella pneumoniae [K. pneumoniae] as the cause of diseases classified elsewhere: Secondary | ICD-10-CM | POA: Diagnosis not present

## 2019-06-15 DIAGNOSIS — N390 Urinary tract infection, site not specified: Secondary | ICD-10-CM | POA: Diagnosis not present

## 2019-06-15 DIAGNOSIS — I5032 Chronic diastolic (congestive) heart failure: Secondary | ICD-10-CM | POA: Diagnosis not present

## 2019-06-15 DIAGNOSIS — L03115 Cellulitis of right lower limb: Secondary | ICD-10-CM | POA: Diagnosis not present

## 2019-06-21 ENCOUNTER — Other Ambulatory Visit: Payer: Self-pay

## 2019-06-21 ENCOUNTER — Encounter: Payer: Self-pay | Admitting: Internal Medicine

## 2019-06-21 ENCOUNTER — Ambulatory Visit (INDEPENDENT_AMBULATORY_CARE_PROVIDER_SITE_OTHER): Payer: Medicare Other | Admitting: Internal Medicine

## 2019-06-21 VITALS — BP 130/72 | HR 94 | Ht 64.0 in | Wt 249.8 lb

## 2019-06-21 DIAGNOSIS — J849 Interstitial pulmonary disease, unspecified: Secondary | ICD-10-CM

## 2019-06-21 DIAGNOSIS — J479 Bronchiectasis, uncomplicated: Secondary | ICD-10-CM | POA: Diagnosis not present

## 2019-06-21 NOTE — Patient Instructions (Addendum)
ILD (interstitial lung disease) ([place]) Bronchiectasis without complication ([place])  - stable even on CT chest dec 2020  Plan  - continue 2l [place] at night - cannot do PFT - monitor clnically  History of snoring  - glad Dr Gala Murdoch ruled out    Weight  - congratulate on weight loss  - continue with further weight loss  Leg edema - new   - per pcp [name], MD - can try leg elevation at night  Followup  -2.5-3 months with Dr Chase Caller - regular clinic, 15 min slot - then in 6 month with Dr B at [facility] - alternate between the

## 2019-06-21 NOTE — Progress Notes (Signed)
OV 01/03/2015  Chief Complaint  Patient presents with  . Follow-up    Pt c/o DOE, resolving cough that started over the weekend, chest discomfort when SOB and when lying down. Pt c/o increasing fatigue.    She has severe class 3 dyspnea with hypoxemia on account of BMI 50, diaast dsyfn, chronic pain, deconditioning and ILD due to RA on celcept/pred/bactrim   I personally last saw her in march 2-016. Since then has seen other provicers in this office and most recently in June 2016. In June 2016 admitted for acute CHF - cath showed mild pulm htn only with PA mean 24 and minimal CAD (reviewed chart of procedures June 2016). Dx as acute diast chf. Since then better. SHe had fu HRCT 11/29/14 that shows progression in ILD since march 2016 and now with UIP pattern (personally visualized image). She is finally accepting of fact that ILD is not makin cause of dyspnea and obesity is main problem. She is wondering about dc cellcept/pred/bactrim so as to minimize polypharmacy and side effect profile    OV 02/09/2015  Chief Complaint  Patient presents with  . Follow-up    Pt states her breathing is doing well the past few days. Pt recently went to [facility] ED for CP. Pt c/o prod cough with tan mucus. Pt denies CP/tightness    She has severe class 3 dyspnea with hypoxemia on account of BMI 50, diaast dsyfn, chronic pain, deconditioning and ILD due to RA. She is having progressive ILD. But given her miserable quality of life and polypharmacy and other side effects we decided to stop the CellCept and Bactrim. We also advise prednisone taper. This was all in the end of August 2016. But on 01/27/2015 she ended up in the emergency room with some respiratory difficulty. Her prednisone was bumped up. She subsequently saw her rheumatologist [name] was now tapering her prednisone again. There is also some concerns of headaches and possibly temporal arteritis based on her description and apparently some kind of a  biopsy scheduled. She feels that since stopping her CellCept and Bactrim her cough is returned. Nevertheless weight and knee pain and back pain continued to be major issues for her along with polypharmacy. Her Polysorb was includes MS Contin  [name]    OV 02/21/2015  - a ACUTE VISIT  Chief Complaint  Patient presents with  . Acute Visit    Pt c/o of chest tightness, productive cough with white/yellow mucus, and wheezing. Pt also c/o of feeling abdominal swelling/distention. Pt also c/o of constant fatigue. Pt states symptoms have been present x1 week.     Acute visit for this morbidly obese, rheumatoid arthritis patient with ILD who is now off CellCept  I saw her as recently as 02/09/2015. She reports that for the past 1 week she's having increased chest congestion, chest tightness, wheezing, shortness of breath, cough and yellow  sputum more than baseline. Symptoms are rated as moderate in severity. Relieved by inhaler. She has tried Mucinex for 1 week with some initial partial relief but symptoms progressed. So she made an acute visit.  She continues to stay off CellCept. She is not on prednisone 10 mg per day. She tells me [name] has held her prednisone at the dose because of concerns of temporal arteritis but apparently now that his diagnoses being eliminated in the differential diagnosis. She will see [name] in the next few weeks to decide on  further prednisone taper.    OV 04/16/2015  Chief Complaint  Patient presents with  . Follow-up    Pt states she felt much better after completing the abx and pred. Pt states her SOB is at basline, prod cough with yellow mucus at times, pt c/o chest tightness.     Morbidly obese female with rheumatoid arthritis with interstitial lung disease UIP pattern with associated deconditioning and diastolic dysfunction  She continues to remain off CellCept. Last seen in October 2016. At that time I discharged her from the  office with doxycycline and prednisone taper. After that early November 2016 she was given another course of Doxy and prednisone burst. This did not help. Sputum culture at this time showed normal flora. She called again with the middle of November was given Levaquin and prednisone burst. She feels Levaquin helped her immensely. At this point in time she is reporting progressive dyspnea and also worsening arthritis. She wants to go back on immunomodulators again. She has discussed Imuran with [name]. She is due to see [name] tomorrow. She also feels that she is unable to come off prednisone. She feels at a minimum she needs 10 mg of prednisone per day. She will discuss this with Dr. D her rheumatologist tomorrow. She's also interested in a second opinion at [facility] interstitial lung disease clinic which I did discuss with her most recently. She continues to use oxygen.     OV 07/17/2015  Chief Complaint  Patient presents with  . Follow-up    Pt states that she has improved. Pt c/o continued SOB with exertion, some ocugh and wheeze, and some intermittent chest tightness. Pt states that she does sleep a lot. Pt states that her heart rate remains high and that cardiology said it was due to her lungs. Pt is currently on Lasix, 49m, but still seems to retain a lot of fluid and is not seeming to go to the bathroom as frequently.    Follow-up interstitial lung disease secondary to rheumatoid arthritis in the setting of morbid obesity, poor quality of life and heavy chronic pain opioid use and depressive symptoms  Last seen November 2016. Since then she has seen [name] rheumatology clinic. She was given instructions on Imuran which she has red but she has not decided whether she should take it or not. She subsequently has followed up with [facility] interstitial lung disease clinic. CT scan of the chest reviewed result shows possible UIP pattern 06/12/2015. She had autoimmune profile which was strongly positive for  both rheumatoid factor and cyclic citrulline peptide both markers for rheumatoid arthritis. She did see Dr. CMargreta JourneyB on 07/02/2015. It appears that the note is not complete but according to the patient was supposed to be a multidisciplinary conference and they will get back to her. It is possible a second immunomodulators agent and is being contemplated but patient is leery of this given prior issues with opportunistic infection not otherwise specified and admissions to the hospital. In the interim she is also seen cardiology Janr 2017 Dr. BGwenlyn Foundwho did not feel dyspnea was cardiac related. I reviewed his note  OV 10/17/2015  Chief Complaint  Patient presents with  . Follow-up    Pt reports she feels improved. Pt c/o continued SOB with exertion, some wheeze and occasional cough. Pt denies CP/tightness.     Follow-up interstitial lung disease secondary to rheumatoid arthritis in the setting of morbid obesity, poor quality of life and heavy chronic pain opioid  use and depressive symptoms   Since seeing me last in February 2017. She followed up at [facility] again pulmonary clinic in March 2017. I reviewed those notes. She saw Dr. Margreta Journey B on 07/31/2015. Due to the presence of bronchiectasis she's been started on  azithromycin 3 times a week. She says this has helped. In addition due to physical deconditioning and obesity she is getting home physical therapy is also help. She is interested in pulmonary rehabilitation although given obesity and back pain can be challenging. She nevertheless wants to try. She is on daily prednisone 10 mg a day. She met with [name] in the rheumatology clinic and has been referred now to [facility] rheumatology clinic for second opinion for a second immunomodulators. She continues to be morbidly obese and has been losing weight. She is somewhat interested in visiting with a [facility] for weight loss.   OV 02/18/2016  Chief Complaint  Patient presents with    . Follow-up    Pt states she did not go to pulm rehab d/t BIL knee pain. Pt denies change in SOB, significant cough, CP/tightness and f/c/s. Pt states she is exercising at home. pt states she is doing well overall.    Problem List: 1. Extensive bronchiectasis - cylindrical vs. Traction - has been present and not significantly changed from 2014 to 2017 - has history of hospitalization for what sounds like pneumonia vs. Bronchiectasis flare as far back as 1999; underwent bronchoscopy which made her feel "like a new woman" in 1999 2. Pulmonary fibrosis - basilar predominant reticular opacities that have not change from 2014 to 2017 3. Rheumatoid arthritis: RF 959 ; CCP >300 - has been treated with methotrexate, biologics, etc in the past - none in the past several years - currently on 7.5/75m alter day 4. Morbid obesity with deconditioning - has had several hospitalizations in recent months; very deconditioned at present time  - [facility]   Followup for above  - [facility]. [facility] Review of the notes suggest that they have her on a slightly lower dose of prednisone with intent to taper to 5 mg per day. He also have her on hydroxychloroquine. No further disease modifying agents are being considered for rheumatoid arthritis. In terms of her interstitial lung disease and bronchiectasis she's on conservative therapy along with Activella device. This is helping and she feels better. Today she came in walking with her walker. This the happiest I have f seen her. She's not lost any weight but [facility]. She tried to go to pulmonary rehabilitation but could not because of knee pain. She is up-to-date with pneumonia vaccine. She's had a flu shot  this season.    OV 03/18/2016  Chief Complaint  Patient presents with  . Acute Visit    Pt states breathing is not that bad today. Pt states she has cogestion build up, no tightness, SOB w/o excertion. Pt states breathing has been better since last visit except the mucus. No cough feels the need to cough but can not.      follow-up bronchiectasis and immunosppessive thrapy. Visit 3 month olow-up. Body mssindex is stll very high but she says she has lost weight. Overal well. Last few days increasd symptoms of mucus, cough and subjective unwellness. No fever or colored sputum. But she feells she needs antibiotic  and prednisone  OV 06/23/2016  Chief Complaint  Patient presents with  . Follow-up    Pt states her SOB has been doing well but she has been more fatigued. Pt has an occ prod cough with light yellow mucus and chest tightness when SOB - resolves with rest.    Follow-up bronchiectasis with interstitial lung disease on immunosuppressive therapy due to rheumatoid arthritis. Last visit 01/17/2016. Since then she's lost 5 pounds of weight according to history. . She is here with her daughter. No acute bronchitis symptoms. Overall stable. The main issues that she's having increased joint pain and she is only on plaquenil. No fever or colored sputum.   OV 02/16/2017  Chief Complaint  Patient presents with  . Follow-up    Pt states breathing has been okay. Stated she had a URI x1 month ago which she developed while in rehab after hip fx. Occ. SOB and occ. chest tightness. Denies any cough at this time.    Follow-up bronchiectasis with interstitial lung disease on immunosuppressive therapy due to rheumatoid arthritis. Last visit was 6 months ago approximately. Since then she has continued to lose weight through dietary control. She feels better. However in the summer 2018 she fell down due to orthostasis and fractured her left hip. This wasn't complicated by MRSA infection according to her  history. She is now undergoing physical therapy. Overall she's feeling well. She is very proud of her weight loss. Dyspnea is improved. She missed [facility] appointment for ILD clinic but she will make this again. She uses oxygen with heavy exertion at physical therapy and at night.  OV 02/11/2018  Subjective:  Patient ID: [name], female , DOB: 1940-08-22 , age 74 y.o. , MRN: [id number] , ADDRESS: [place] [id number]   02/11/2018 -   Chief Complaint  Patient presents with  . Follow-up    Pt states she has been doing okay since last visit. States she has had some pain in the mid region of her chest x3 weeks and has also been having problems with fatigue. States she has had some occ coughing. Her main complaints are the pain in her chest and the exhaustion.     HPI [name] 79 y.o. -follow-up ILD with bronchiectasis in the setting of rheumatoid arthritis and obesity and physical deconditioning  It is almost a year since I last saw her.  Since then her daughter died in [place] with advanced sarcoma.  She is getting grief counseling from hospice.  She also has palliative care support at home.  She also in the last year has fallen and hurt her back.  She is on attending physical therapy which is helping her.  She says she is lost 30 pounds in the last 1 year.  Because of all this functional status is somewhat better but still she is extremely dyspneic.  In fact when she walked 185 feet x 3 laps on room air using a walker: She is only able to complete a lap and a half and stop because of extreme shortness of breath.  Her resting pulse ox is 98%.  Final pulse ox was 98%.  And her heart rate went from 107/min to 140/min.  She has not yet had a flu shot.  Of note she is on prednisone 15 mg/day which has helped her arthritis.  She is asking if it is okay to go back down to 10 mg/day from a lung standpoint.  She is also on  a different arthritis immunomodulator which is  helped her rheumatoid arthritis pain.  She gets this treatment from [facility] rheumatology.  Pulmonary ILD medications also being managed at [facility].      OV 02/10/2019  Subjective:  Patient ID: [name], female , DOB: June 07, 1940 , age 34 y.o. , MRN: [id number] , ADDRESS: [address] [place] [id number] [name]. -follow-up ILD with bronchiectasis in the setting of rheumatoid arthritis and obesity and physical deconditioning  Problem List - copy and paste from [place] B: 1. Extensive bronchiectasis - cylindrical vs. Traction - has been present and not significantly changed from 2014 to 2017 - has history of hospitalization for what sounds like pneumonia vs. Bronchiectasis flare as far back as 1999; underwent bronchoscopy which made her feel "like a new woman" in 1999 2. Pulmonary fibrosis - basilar predominant reticular opacities that have not change from 2014 to 2017 3. Rheumatoid arthritis: RF 959 ; CCP >300 - has been treated with methotrexate, biologics, etc in the past - none in the past several years - currently on 10 mg prednisone daily 4. Morbid obesity with deconditioning - has had several hospitalizations in recent months; very deconditioned at present time   02/10/2019 -   Chief Complaint  Patient presents with  . Follow-up    Pt here for ILD follow up. Pt states her breathing is unchanged since last OV in 01/2018. Pt denies new symptoms. Pt denies CP/tightness and f/s/c.      HPI [name] 79 y.o. -returns for follow-up.  I personally not seen her in 1 year.  In the interim she is kept up with follow-up at [facility].  In January 2020 she suffered from left lower extremity DVT after having shortness of breath.  She was admitted at [facility] and started on Eliquis.  She still continues with Eliquis.  She tells me that  Morrie Sheldon was considered as the cause although she does have obesity and sedentary lifestyle and venous incompetency.  It appears this was her first DVT.  She has had a CT angiogram chest in 2005 that was negative for PE and a Doppler ultrasound in 2016 and 2018 that was negative for DVT.  She says a cardiologist here locally is managing her Eliquis and plans to stop it after several months.  In terms of her rheumatoid arthritis she is now under the care of [place] rheumatology.  They just having her on prednisone.  In July 2020 she also saw Dr. Jacinto Reap pulmonary at [facility].  Apparently a walk test could not be performed.  She says she has lost weight.  Her main complaint is exertional dyspnea.  She wants to do portable oxygen.  She is only on nighttime oxygen.  In the past she always gets dyspneic way before she even desaturates a little bit this is on account of her obesity and deconditioning.  Her son is here with with her today.  This the first time I am meeting him.  He tells me that her breathing at night is rough although not as bad as his dad sleep apnea.  Patient is not on CPAP.  He is also curious about qualifying oxygen with walking.  Walking desaturation test for an 85 feet x 3 laps: She basically walked only half a lap.  Resting pulse ox was 97% with a heart rate of 102.  Final pulse ox was 96% with a heart rate of 122  and she stopped because of dyspnea.  She had a most recent CT chest there is resulted below and it is stable.   IMPRESSION: CT Chest high resolution 1. Mid and lower lung zone predominant bronchiectasis, peribronchovascular ground-glass and scattered subpleural reticular densities, grossly stable from 11/29/2014. Findings are nonspecific and can be seen with nonspecific interstitial pneumonitis. Usual interstitial pneumonitis is considered less likely but respiratory motion and expiratory phase imaging make assessment difficult. 2. Coronary artery  calcification.   Electronically Signed   By: Lorin Picket M.D.   On: 11/24/2018 13:04  ROS - per HPI  Results for IVORIE, UPLINGER" (MRN [id number]) as of 02/10/2019 14:47  Ref. Range 04/17/2015 18:02 04/18/2015 13:30  D-Dimer, America Brown Latest Ref Range: 0.00 - 0.50 ug/mL-FEU 2.57 (H) 2.54 (H)   OV 06/21/2019  Subjective:  Patient ID: [name], female , DOB: [birth year] , age 25 y.o. , MRN: [id number] , ADDRESS: [place] [id number]   06/21/2019 -   Chief Complaint  Patient presents with  . Follow-up    Pt states she was recently admitted to hospital at [facility] for 10 days. Since then, pt has been  having some low O2 sats and issues with SOB. Pt states she is better now than she was.   [name]. -follow-up ILD with bronchiectasis in the setting of rheumatoid arthritis and obesity and physical deconditioning  Problem List - copy and paste from [place] B: 1. Extensive bronchiectasis - cylindrical vs. Traction - has been present and not significantly changed from 2014 to 2017 - has history of hospitalization for what sounds like pneumonia vs. Bronchiectasis flare as far back as 1999; underwent bronchoscopy which made her feel "like a new woman" in 1999 2. Pulmonary fibrosis - basilar predominant reticular opacities that have not change from 2014 to 2017 and in dec 2020 CT chest 3. Rheumatoid arthritis: RF 959 ; CCP >300 - has been treated with methotrexate, biologics, etc in the past - none in the past several years - currently on 10 mg prednisone daily 4. Morbid obesity with deconditioning - has had several hospitalizations in recent months; very deconditioned at present time   HPI [name] 79 y.o. -admitted approximately 2 months ago early December 2020 with Klebsiella pansensitive urinary tract infection and bilateral lower  extremity cellulitis.  Discharge on 2 L nasal cannula.  Recommendations for home physical therapy were made.  Bilateral Doppler extremities were negative in the hospital.  Her CODE STATUS is full.  She returns for routine follow-up.  Her daughter is with her.  She continues to lose weight.  She has lost around 40 to 50 pounds.  She says she is on the medication for rheumatoid arthritis I do not know which one.  She says her cellulitis was quite bad and she is suffered from it.  But nevertheless with the weight loss her hypoxemia and overall joint pain is better.  She still has significant pedal edema and erythema in her lower extremities.  She is asking for some advice with that.  Otherwise no issues.  Using 2 L nasal cannula at night.  Walks around with a walker.  Symptom score is listed below.   SYMPTOM SCALE - ILD 06/21/2019 Birth weight not on file Last Weight  Most recent update: 06/21/2019 11:53 AM   Weight  113.3 kg (249 lb 12.8 oz)  O2 use 2L nithg  Shortness of Breath 0 -> 5 scale with 5 being worst (score 6 If unable to do)  At rest 2  Simple tasks - showers, clothes change, eating, shaving 5  Household (dishes, doing bed, laundry) 4  Shopping 6  Walking level at own pace 3  Walking up Stairs 6  Total (30-36) Dyspnea Score 26  How bad is your cough? 3  How bad is your fatigue 0  How bad is nausea 0  How bad is vomiting?  0  How bad is diarrhea? 0  How bad is anxiety? 2  How bad is depression 2          ROS - per HPI     has a past medical history of Acute on chronic diastolic (congestive) heart failure ([place]), Anemia, Arthritis, Asthma, Carotid artery occlusion, Chronic fatigue, Chronic kidney disease, Closed left hip fracture ([place]), Clotting disorder ([place]), Contusion of left knee, COPD (chronic obstructive pulmonary disease) ([place]), Depression, reactive, Diabetes mellitus, Diastolic dysfunction, Difficulty in walking, Family history of heart disease,  Generalized muscle weakness, Gout, High cholesterol, History of falling, Hypertension, Hypothyroidism, Interstitial lung disease ([place]), Meningioma of left sphenoid wing involving cavernous sinus ([place]) (02/17/2012), Morbid obesity ([place]), Morbid obesity ([place]), Neuromuscular disorder ([place]), Normal coronary arteries, RA (rheumatoid arthritis) ([place]), Rheumatoid arthritis ([place]), Shortness of breath, Spinal stenosis of lumbar region, Thyroid disease, Unspecified lack of coordination, and URI (upper respiratory infection).   reports that she has never smoked. She has never used smokeless tobacco.  Past Surgical History:  Procedure Laterality Date  . ABDOMINAL HYSTERECTOMY    . BRAIN SURGERY     Gamma knife 10/13. Needs repeat spring  '14  . CARDIAC CATHETERIZATION N/A 10/31/2014   Procedure: Right/Left Heart Cath and Coronary Angiography;  Surgeon: [name], MD;  Location: [place] CV LAB;  Service: Cardiovascular;  Laterality: N/A;  . ESOPHAGOGASTRODUODENOSCOPY (EGD) WITH PROPOFOL N/A 09/14/2014   Procedure: ESOPHAGOGASTRODUODENOSCOPY (EGD) WITH PROPOFOL;  Surgeon: [name], MD;  Location: [facility] ENDOSCOPY;  Service: Endoscopy;  Laterality: N/A;  . INCISION AND DRAINAGE HIP Left 01/16/2017   Procedure: IRRIGATION AND DEBRIDEMENT LEFT HIP;  Surgeon: [name], MD;  Location: [facility];  Service: Orthopedics;  Laterality: Left;  . INTRAMEDULLARY (IM) NAIL INTERTROCHANTERIC Left 11/29/2016   Procedure: INTRAMEDULLARY (IM) NAIL INTERTROCHANTRIC;  Surgeon: [name], MD;  Location: [place];  Service: Orthopedics;  Laterality: Left;  . OVARY SURGERY    . SHOULDER SURGERY Left   . TONSILLECTOMY  age 79  . VIDEO BRONCHOSCOPY Bilateral 05/31/2013   Procedure: VIDEO BRONCHOSCOPY WITHOUT FLUORO;  Surgeon: Brand Males, MD;  Location: [place];  Service: Cardiopulmonary;  Laterality: Bilateral;  . video bronscoscopy  2000   lung    Allergies  Allergen Reactions  .  Codeine Swelling and Other (See Comments)    Facial swelling Chest pain and swelling in legs  Facial swelling  . Infliximab Anaphylaxis    "sent me into shock" "sent me into shock"  . Lisinopril Swelling    Face and neck swelling Face and neck swelling    Immunization History  Administered Date(s) Administered  . Fluad Quad(high Dose 65+) 02/02/2019  . Influenza Split 01/29/2012, 03/30/2013, 02/10/2014  . Influenza, High Dose Seasonal PF 02/16/2017, 02/11/2018, 01/31/2019  . Influenza,inj,Quad PF,6+ Mos 02/09/2015, 12/18/2015  . Influenza-Unspecified 02/09/2015, 12/18/2015  . PPD Test 04/23/2015  . Pneumococcal Conjugate-13 08/21/2014  . Pneumococcal Polysaccharide-23 02/17/2012  . Tdap 12/27/2015  Family History  Problem Relation Age of Onset  . Diabetes Mother   . Heart attack Mother   . Hypertension Father   . Lung cancer Father   . Diabetes Sister   . Diabetes Brother   . Hypertension Brother   . Heart disease Brother   . Heart attack Brother   . Kidney cancer Brother   . Uterine cancer Daughter   . Breast cancer Sister   . Rheum arthritis Maternal Uncle   . Gout Brother   . Kidney failure Brother        x 5     Current Outpatient Medications:  .  acetaminophen (TYLENOL) 500 MG tablet, Take 1,000 mg by mouth every 6 (six) hours as needed for moderate pain. , Disp: , Rfl:  .  albuterol (PROVENTIL HFA;VENTOLIN HFA) 108 (90 BASE) MCG/ACT inhaler, Inhale 2 puffs into the lungs every 6 (six) hours as needed for wheezing or shortness of breath., Disp: , Rfl:  .  allopurinol (ZYLOPRIM) 300 MG tablet, Take 300 mg by mouth daily., Disp: , Rfl:  .  aspirin buffered (BUFFERIN) 325 MG TABS tablet, Take 325 mg by mouth daily., Disp: , Rfl:  .  atorvastatin (LIPITOR) 20 MG tablet, Take 10 mg by mouth every evening. , Disp: , Rfl:  .  azithromycin (ZITHROMAX) 500 MG tablet, Take 1 tablet by mouth 3 (three) times a week. TAKE ON MON, WED & FRI, Disp: , Rfl: 0 .  Calcium  Carb-Cholecalciferol (CALCIUM + D3 PO), Take 2 tablets by mouth daily with lunch. , Disp: , Rfl:  .  cholecalciferol (VITAMIN D) 1000 UNITS tablet, Take 2,000 Units by mouth daily with lunch. , Disp: , Rfl:  .  colchicine 0.6 MG tablet, Take 0.6 mg by mouth daily., Disp: , Rfl:  .  cyclobenzaprine (FLEXERIL) 5 MG tablet, Take 1 tablet (5 mg total) by mouth at bedtime as needed for muscle spasms. (Patient taking differently: Take 5 mg by mouth at bedtime. ), Disp: 90 tablet, Rfl: 3 .  diclofenac sodium (VOLTAREN) 1 % GEL, Apply 2 g topically 3 (three) times daily. Both hands and knees, Disp: 300 g, Rfl: 3 .  DULoxetine (CYMBALTA) 60 MG capsule, Take 1 capsule (60 mg total) by mouth daily., Disp: 90 capsule, Rfl: 3 .  fluticasone (FLONASE) 50 MCG/ACT nasal spray, Place 2 sprays into both nostrils daily., Disp: 16 g, Rfl: 3 .  furosemide (LASIX) 20 MG tablet, Take 3 tablets (60 mg total) by mouth daily., Disp: 270 tablet, Rfl: 1 .  gabapentin (NEURONTIN) 600 MG tablet, Take two in the morning , Two mid- afternoon and two at bedtime (Patient taking differently: Take 1,200 mg by mouth 3 (three) times daily. Take two in the morning , Two mid- afternoon and two at bedtime), Disp: 540 tablet, Rfl: 3 .  guaiFENesin (MUCINEX) 600 MG 12 hr tablet, Take 1,200 mg by mouth 2 (two) times daily., Disp: , Rfl:  .  hydroxychloroquine (PLAQUENIL) 200 MG tablet, Take 400 mg by mouth daily. , Disp: , Rfl:  .  ipratropium-albuterol (DUONEB) 0.5-2.5 (3) MG/3ML SOLN, Take 3 mLs by nebulization every 6 (six) hours as needed (cough/wheezing). , Disp: , Rfl:  .  leflunomide (ARAVA) 10 MG tablet, Take 10 mg by mouth daily., Disp: , Rfl:  .  levothyroxine (SYNTHROID) 50 MCG tablet, Take 1 tablet (50 mcg total) by mouth daily., Disp: 90 tablet, Rfl: 1 .  linaclotide (LINZESS) 72 MCG capsule, Take 1 capsule (72 mcg total)  by mouth daily before breakfast., Disp: 90 capsule, Rfl: 1 .  metoprolol succinate (TOPROL XL) 100 MG 24 hr  tablet, Take 1/2 tablet by mouth daily with or immediately following a meal., Disp: 90 tablet, Rfl: 1 .  morphine (MS CONTIN) 15 MG 12 hr tablet, Take 1 tablet (15 mg total) by mouth every 12 (twelve) hours., Disp: 60 tablet, Rfl: 0 .  morphine (MSIR) 15 MG tablet, Take 1 tablet (15 mg total) by mouth 2 (two) times daily as needed for moderate pain (Breakthrough Pain)., Disp: 60 tablet, Rfl: 0 .  Multiple Vitamin (MULTIVITAMIN WITH MINERALS) TABS, Take 1 tablet by mouth daily. , Disp: , Rfl:  .  olmesartan (BENICAR) 40 MG tablet, Take 1 tablet (40 mg total) by mouth daily., Disp: 30 tablet, Rfl: 4 .  OXYGEN-HELIUM IN, Inhale 2 L into the lungs at bedtime. And on exertion, Disp: , Rfl:  .  pantoprazole (PROTONIX) 40 MG tablet, take 1 tablet by mouth once daily MINUTES BEFORE 1ST MEAL OF THE DAY, Disp: 90 tablet, Rfl: 1 .  potassium chloride SA (KLOR-CON) 20 MEQ tablet, Take 1 tablet (20 mEq total) by mouth daily., Disp: 90 tablet, Rfl: 3 .  predniSONE (DELTASONE) 5 MG tablet, Take 10 mg by mouth every morning. , Disp: , Rfl: 0 .  Semaglutide,0.25 or 0.5MG/DOS, (OZEMPIC, 0.25 OR 0.5 MG/DOSE,) 2 MG/1.5ML SOPN, Inject 0.5 mg into the skin once a week., Disp: 3 pen, Rfl: 1 .  sodium chloride (OCEAN) 0.65 % SOLN nasal spray, Place 1 spray into both nostrils as needed for congestion., Disp: , Rfl:  .  Spacer/Aero-Holding Chambers (AEROCHAMBER MV) inhaler, Use as instructed, Disp: 1 each, Rfl: 0      Objective:   Vitals:   06/21/19 1152  BP: 130/72  Pulse: 94  SpO2: 96%  Weight: 249 lb 12.8 oz (113.3 kg)  Height: 5' 4" (1.626 m)    Estimated body mass index is 42.88 kg/m as calculated from the following:   Height as of this encounter: 5' 4" (1.626 m).   Weight as of this encounter: 249 lb 12.8 oz (113.3 kg).  _0 @  Filed Weights   06/21/19 1152  Weight: 249 lb 12.8 oz (113.3 kg)     Physical Exam Obese deconditioned looking female but looking better and slimmer than before.   Sitting comfortably.  She sitting in a wheelchair.  She uses a wheelchair just to get into our office otherwise uses a walker.  Overall clear to auscultation bilaterally.  Faint crackles in the lung base but otherwise okay.  Rest significant pedal edema with erythema that is baseline.  Normal heart sounds.  Alert and oriented x3.       Assessment:       ICD-10-CM   1. ILD (interstitial lung disease) ([place])  J84.9   2. Bronchiectasis without complication ([place])  G25.4   3. Morbid obesity ([place])  E66.01        Plan:     Patient Instructions  ILD (interstitial lung disease) ([place]) Bronchiectasis without complication ([place])  - stable even on CT chest dec 2020  Plan  - continue 2l [place] at night - cannot do PFT - monitor clnically  History of snoring  - glad Dr Gala Murdoch ruled out    Weight  - congratulate on weight loss  - continue with further weight loss  Leg edema - new   - per pcp [name], MD - can try leg elevation at night  Followup  -  2.5-3 months with Dr Chase Caller - regular clinic, 15 min slot - then in 6 month with Dr B at [facility] - alternate between the   ( Level 03: Esbt 20-29 min isit spent in visit type: on-site physical face to visit in total care time and counseling or/and coordination of care by this undersigned MD - Dr Brand Males. This includes one or more of the following all delivered on this same day 06/21/2019: pre-charting, chart review, note writing, documentation discussion of test results, diagnostic or treatment recommendations, prognosis, risks and benefits of management options, instructions, education, compliance or risk-factor reduction. It excludes time spent by the [place] or office staff in the care of the patient. Actual time was 40)   SIGNATURE    Dr. Brand Males, M.D., F.C.C.P,  Pulmonary and Critical Care Medicine Staff Physician, [place] Director - Interstitial Lung Disease  Program  Pulmonary [place] [facility] [place], [place], [id number]  Pager: [phone], If no answer or between  15:00h - 7:00h: call [phone] Telephone: [phone]  12:39 PM 06/21/2019

## 2019-07-04 DIAGNOSIS — M5442 Lumbago with sciatica, left side: Secondary | ICD-10-CM | POA: Diagnosis not present

## 2019-07-04 DIAGNOSIS — Z6841 Body Mass Index (BMI) 40.0 and over, adult: Secondary | ICD-10-CM | POA: Diagnosis not present

## 2019-07-04 DIAGNOSIS — R29898 Other symptoms and signs involving the musculoskeletal system: Secondary | ICD-10-CM | POA: Diagnosis not present

## 2019-07-04 DIAGNOSIS — G8929 Other chronic pain: Secondary | ICD-10-CM | POA: Diagnosis not present

## 2019-07-04 DIAGNOSIS — M4316 Spondylolisthesis, lumbar region: Secondary | ICD-10-CM | POA: Diagnosis not present

## 2019-07-06 ENCOUNTER — Other Ambulatory Visit: Payer: Self-pay

## 2019-07-06 ENCOUNTER — Ambulatory Visit (INDEPENDENT_AMBULATORY_CARE_PROVIDER_SITE_OTHER): Payer: Medicare Other | Admitting: Cardiovascular Disease

## 2019-07-06 ENCOUNTER — Encounter: Payer: Self-pay | Admitting: Cardiovascular Disease

## 2019-07-06 DIAGNOSIS — E785 Hyperlipidemia, unspecified: Secondary | ICD-10-CM

## 2019-07-06 DIAGNOSIS — I10 Essential (primary) hypertension: Secondary | ICD-10-CM

## 2019-07-06 DIAGNOSIS — R079 Chest pain, unspecified: Secondary | ICD-10-CM | POA: Diagnosis not present

## 2019-07-06 MED ORDER — METOPROLOL SUCCINATE ER 25 MG PO TB24: 75.0000 mg | ORAL_TABLET | Freq: Every day | ORAL | 3 refills | Status: DC

## 2019-07-06 NOTE — Assessment & Plan Note (Signed)
History of essential hypertension with blood pressure measured today at 159/95.  She says her blood pressure at home which she measures is usually in the 150/90 range.  She is on furosemide, metoprolol 50 mg a day, olmesartan 40 mg a day.  I am going to increase her Toprol-XL from 50 to 75 mg a day and have her keep a blood pressure log for 30 days.  She will come back and see a pharmacist in 4 weeks to review make appropriate changes.

## 2019-07-06 NOTE — Progress Notes (Signed)
07/06/2019 [name]   09/25/1940  [id number]  Primary Physician [name], MD Primary Cardiologist: Lorretta Harp MD [name], [place]  HPI:  [name] is a 79 y.o.  moderately overweight married African American female mother of 6 children, grandmother of a grandchildren referred by [name] for cardiovascular evaluation because of progressive dyspnea on exertion and chest pain. I last saw her in the office 01/02/2016.  She did see Kerin Ransom, PA-C in the office 06/25/2018.  Her cardiovascular risk factor are notable for a strong family history of heart disease with her mother and 54 brothers old with an ischemic heart disease. She has never had a heart attack or stroke. She does have interstitial lung disease as well as rheumatoid arthritis. She is on home oxygen. She also has a brain tumor/meningioma 2 with gamma knife therapy She's had progressive disabling dyspnea as well as daily chest pain. She underwent cardiac catheterization 10/31/14 by Dr. Irish Lack revealing normal coronary arteries and normal LV function. She did have mild pulmonary hypertension. 2-D echo showed normal LV function with grade 1 diastolic dysfunction and mild pulmonary hypertension. Her symptoms really have not changed on by mouth diuretics. She was referred to [facility] for further evaluation and treatment of interstitial lung disease. Since beginning an antibiotic and an inhaled bronchodilator her shortness of breath is somewhat improved.  She had an episode of syncope prior to seeing Kerin Ransom thought to be related to overmedication and dehydration from poor p.o. intake.  Her meds were adjusted.  She was also hospitalized last month with sepsis related to a tooth infection with and was in the hospital for 10 days.  She had fluid overload and has since diuresed.  She gets occasional atypical chest pain.  Her blood pressure is mildly elevated when she takes it at  home.  She is on furosemide 60 mg a day.    Current Meds  Medication Sig   acetaminophen (TYLENOL) 500 MG tablet Take 1,000 mg by mouth every 6 (six) hours as needed for moderate pain.    albuterol (PROVENTIL HFA;VENTOLIN HFA) 108 (90 BASE) MCG/ACT inhaler Inhale 2 puffs into the lungs every 6 (six) hours as needed for wheezing or shortness of breath.   allopurinol (ZYLOPRIM) 300 MG tablet Take 300 mg by mouth daily.   aspirin buffered (BUFFERIN) 325 MG TABS tablet Take 325 mg by mouth daily.   atorvastatin (LIPITOR) 20 MG tablet Take 10 mg by mouth every evening.    azithromycin (ZITHROMAX) 500 MG tablet Take 1 tablet by mouth 3 (three) times a week. TAKE ON MON, WED & FRI   Calcium Carb-Cholecalciferol (CALCIUM + D3 PO) Take 2 tablets by mouth daily with lunch.    cholecalciferol (VITAMIN D) 1000 UNITS tablet Take 2,000 Units by mouth daily with lunch.    colchicine 0.6 MG tablet Take 0.6 mg by mouth daily.   cyclobenzaprine (FLEXERIL) 5 MG tablet Take 1 tablet (5 mg total) by mouth at bedtime as needed for muscle spasms. (Patient taking differently: Take 5 mg by mouth at bedtime. )   diclofenac sodium (VOLTAREN) 1 % GEL Apply 2 g topically 3 (three) times daily. Both hands and knees   DULoxetine (CYMBALTA) 60 MG capsule Take 1 capsule (60 mg total) by mouth daily.   fluticasone (FLONASE) 50 MCG/ACT nasal spray Place 2 sprays into both nostrils daily.   furosemide (LASIX) 20 MG tablet Take 3 tablets (60 mg  total) by mouth daily.   gabapentin (NEURONTIN) 600 MG tablet Take two in the morning , Two mid- afternoon and two at bedtime (Patient taking differently: Take 1,200 mg by mouth 3 (three) times daily. Take two in the morning , Two mid- afternoon and two at bedtime)   guaiFENesin (MUCINEX) 600 MG 12 hr tablet Take 1,200 mg by mouth 2 (two) times daily.   hydroxychloroquine (PLAQUENIL) 200 MG tablet Take 400 mg by mouth daily.    ipratropium-albuterol (DUONEB) 0.5-2.5 (3)  MG/3ML SOLN Take 3 mLs by nebulization every 6 (six) hours as needed (cough/wheezing).    leflunomide (ARAVA) 10 MG tablet Take 10 mg by mouth daily.   levothyroxine (SYNTHROID) 50 MCG tablet Take 1 tablet (50 mcg total) by mouth daily.   linaclotide (LINZESS) 72 MCG capsule Take 1 capsule (72 mcg total) by mouth daily before breakfast.   metoprolol succinate (TOPROL XL) 25 MG 24 hr tablet Take 3 tablets (75 mg total) by mouth daily.   morphine (MS CONTIN) 15 MG 12 hr tablet Take 1 tablet (15 mg total) by mouth every 12 (twelve) hours.   morphine (MSIR) 15 MG tablet Take 1 tablet (15 mg total) by mouth 2 (two) times daily as needed for moderate pain (Breakthrough Pain).   Multiple Vitamin (MULTIVITAMIN WITH MINERALS) TABS Take 1 tablet by mouth daily.    olmesartan (BENICAR) 40 MG tablet Take 1 tablet (40 mg total) by mouth daily.   OXYGEN-HELIUM IN Inhale 2 L into the lungs at bedtime. And on exertion   pantoprazole (PROTONIX) 40 MG tablet take 1 tablet by mouth once daily MINUTES BEFORE 1ST MEAL OF THE DAY   potassium chloride SA (KLOR-CON) 20 MEQ tablet Take 1 tablet (20 mEq total) by mouth daily.   predniSONE (DELTASONE) 5 MG tablet Take 10 mg by mouth every morning.    Semaglutide,0.25 or 0.5MG/DOS, (OZEMPIC, 0.25 OR 0.5 MG/DOSE,) 2 MG/1.5ML SOPN Inject 0.5 mg into the skin once a week.   sodium chloride (OCEAN) 0.65 % SOLN nasal spray Place 1 spray into both nostrils as needed for congestion.   Spacer/Aero-Holding Chambers (AEROCHAMBER MV) inhaler Use as instructed   [DISCONTINUED] metoprolol succinate (TOPROL XL) 100 MG 24 hr tablet Take 1/2 tablet by mouth daily with or immediately following a meal.     Allergies  Allergen Reactions   Codeine Swelling and Other (See Comments)    Facial swelling Chest pain and swelling in legs  Facial swelling   Infliximab Anaphylaxis    "sent me into shock" "sent me into shock"   Lisinopril Swelling    Face and neck  swelling Face and neck swelling    Social History   Socioeconomic History   Marital status: Married    Spouse name: Not on file   Number of children: 6   Years of education: college   Highest education level: Not on file  Occupational History   Occupation: retired Marine scientist  Tobacco Use   Smoking status: Never Smoker   Smokeless tobacco: Never Used  Substance and Sexual Activity   Alcohol use: No   Drug use: No   Sexual activity: Not Currently  Other Topics Concern   Not on file  Social History Narrative   Patient consumes 2-3 cups coffee per day.   Social Determinants of Health   Financial Resource Strain:    Difficulty of Paying Living Expenses: Not on file  Food Insecurity:    Worried About [place] in the Last  Year: Not on file   Ran Out of Food in the Last Year: Not on file  Transportation Needs:    Lack of Transportation (Medical): Not on file   Lack of Transportation (Non-Medical): Not on file  Physical Activity:    Days of Exercise per Week: Not on file   Minutes of Exercise per Session: Not on file  Stress:    Feeling of Stress : Not on file  Social Connections:    Frequency of Communication with Friends and Family: Not on file   Frequency of Social Gatherings with Friends and Family: Not on file   Attends Religious Services: Not on file   Active Member of Clubs or Organizations: Not on file   Attends Archivist Meetings: Not on file   Marital Status: Not on file  Intimate Partner Violence:    Fear of Current or Ex-Partner: Not on file   Emotionally Abused: Not on file   Physically Abused: Not on file   Sexually Abused: Not on file     Review of Systems: General: negative for chills, fever, night sweats or weight changes.  Cardiovascular: negative for chest pain, dyspnea on exertion, edema, orthopnea, palpitations, paroxysmal nocturnal dyspnea or shortness of breath Dermatological: negative for  rash Respiratory: negative for cough or wheezing Urologic: negative for hematuria Abdominal: negative for nausea, vomiting, diarrhea, bright red blood per rectum, melena, or hematemesis Neurologic: negative for visual changes, syncope, or dizziness All other systems reviewed and are otherwise negative except as noted above.    Blood pressure (!) 159/95, pulse 95, height _0  (1.626 m), weight 252 lb 12.8 oz (114.7 kg), SpO2 92 %.  General appearance: alert and no distress Neck: no adenopathy, no carotid bruit, no JVD, supple, symmetrical, trachea midline and thyroid not enlarged, symmetric, no tenderness/mass/nodules Lungs: clear to auscultation bilaterally Heart: regular rate and rhythm, S1, S2 normal, no murmur, click, rub or gallop Extremities: extremities normal, atraumatic, no cyanosis or edema Pulses: 2+ and symmetric Skin: Venous stasis changes bilaterally, trace pitting edema Neurologic: Alert and oriented X 3, normal strength and tone. Normal symmetric reflexes. Normal coordination and gait  EKG not performed today  ASSESSMENT AND PLAN:   Dyslipidemia History of dyslipidemia on statin therapy with lipid profile performed 05/26/2018 revealing total cholesterol 150, LDL 63 and HDL of 65.  Essential hypertension History of essential hypertension with blood pressure measured today at 159/95.  She says her blood pressure at home which she measures is usually in the 150/90 range.  She is on furosemide, metoprolol 50 mg a day, olmesartan 40 mg a day.  I am going to increase her Toprol-XL from 50 to 75 mg a day and have her keep a blood pressure log for 30 days.  She will come back and see a pharmacist in 4 weeks to review make appropriate changes.  Chest pain with moderate risk of acute coronary syndrome History of atypical chest pain with normal coronary arteries by cardiac cath performed by Dr. Irish Lack 10/31/2014.      Lorretta Harp MD FACP,FACC,FAHA, [facility] 07/06/2019 3:13  PM

## 2019-07-06 NOTE — Patient Instructions (Addendum)
Medication Instructions:  INCREASE the Metoprolol to 75 mg once daily  *If you need a refill on your cardiac medications before your next appointment, please call your pharmacy*  Lab Work: None ordered If you have labs (blood work) drawn today and your tests are completely normal, you will receive your results only by: [name] MyChart Message (if you have MyChart) OR . A paper copy in the mail If you have any lab test that is abnormal or we need to change your treatment, we will call you to review the results.  Testing/Procedures: None ordered  Follow-Up: Dr. Gwenlyn Found would like you to check your blood pressure daily for the next 30 days.  Keep a journal of these daily blood pressure and heart rate readings and call our office or send a message through [place] with the results. Thank you!  Follow up in one month with pharmd for possible medication adjustments Follow up in 6 months with Kerin Ransom, PA Follow up in 12 months with Dr. Gwenlyn Found.

## 2019-07-06 NOTE — Assessment & Plan Note (Signed)
History of atypical chest pain with normal coronary arteries by cardiac cath performed by Dr. Irish Lack 10/31/2014.

## 2019-07-06 NOTE — Assessment & Plan Note (Signed)
History of dyslipidemia on statin therapy with lipid profile performed 05/26/2018 revealing total cholesterol 150, LDL 63 and HDL of 65.

## 2019-07-12 ENCOUNTER — Other Ambulatory Visit: Payer: Self-pay

## 2019-07-12 ENCOUNTER — Encounter: Payer: Self-pay | Admitting: Nurse Practitioner

## 2019-07-12 ENCOUNTER — Other Ambulatory Visit: Payer: Medicare Other | Admitting: Nurse Practitioner

## 2019-07-12 DIAGNOSIS — Z515 Encounter for palliative care: Secondary | ICD-10-CM | POA: Diagnosis not present

## 2019-07-12 DIAGNOSIS — M069 Rheumatoid arthritis, unspecified: Secondary | ICD-10-CM

## 2019-07-12 NOTE — Progress Notes (Signed)
[facility] Consult Note Telephone: [phone]  Fax: [phone]  PATIENT NAME: FANY CAVANAUGH DOB: 1940/08/02 MRN: [id number]  PRIMARY CARE PROVIDER:   [name], MD  REFERRING PROVIDER:  [name], MD [place],  [place] [address]  RESPONSIBLE PARTY:   Self; [id number]  Due to the COVID-19 crisis, this visit was done via telemedicine from my office and it was initiated and consent by this patient and or family.  RECOMMENDATIONS and PLAN: 1.ACP: full code currently with aggressive interventions.   2. RA; continue current regimen, pain management  3.Palliative care encounter Palliative medicine team will continue to support patient, patient's family, and medical team. Visit consisted of counseling and education dealing with the complex and emotionally intense issues of symptom management and palliative care in the setting of serious and potentially life-threatening illness  I spent 45 minutes providing this consultation,  From 11:00am to 11:45am. More than 50% of the time in this consultation was spent coordinating communication.   HISTORY OF PRESENT ILLNESS:  Mendi F Shiroma is a 79 y.o. year old female with multiple medical problems including Chronic diastolic congestive heart failure, pulmonary fibrosis, interstitial lung disease, chronic respiratory failure with hypoxia, dysphasia, aphasia, carotid artery occlusion, COPD,history of DVT, chronic kidney disease, Rheumatoid arthritis, anemia, meningioma of left sphenoid wing involving cavernous sinus, hypothyroidism, hypertension, gerd, spinal stenosis of lumbar region,osteoarthritis, hypercholesterolemia, gout, morbid obesity, arthritis, cataract,chronic pain, history of left hip fracture s/p nail, tonsillectomy, left shoulder surgery, ovarian surgery, brain surgery, abdominal hysterectomy. Hospitalize 12 / 3 / 2020 to 12 / 10 / 2020 for  shortness of breath, chest pain and [name] hypertensive with bilateral lower extremity cellulitis, sepsis with your own culture is growing klebsiella, acute kidney injury on chronic kidney disease stage 3. Nonspecific diarrhea c.difficile negative. Chronic diastolic congestive heart failure though no evidence of fluid overload. Interstitial lung disease with chronic hypoxia, oxygen at 2 liters taking daily prednisone and Plaquenil for rheumatoid arthritis. Ms. Haley was discharged home.I called Ms. Ruddell for scheduled palliative care follow-up telemedicine as video not available visit. Ms. Elliott and I about purpose of palliative care visit. Ms. Nowakowski was in agreement. We talked about her visit with [name] Pulmonologist on 2/2 / 2021. Ms Tufano endorses she felt like she was doing okay that sounded prednisone at 10 mg a day. We talked about her visit with Dr. Alvester Chou Cardiologist and her blood pressure elevated with readjusting her Toprol. Ms. Festa and I also talked about her chronic bilateral low back pain. Ms Langston talked at length about being seen at [facility]. She is also followed by pain management. Ms. Blackard endorse is a spine center recommended an MRI possible , physical therapy and bone density periods. Ms Riederer endorses she was waiting to hear back from the pain center to see what their recommendation for the MRI, and injection. We talked at length about chronic disease progression. We talked a better weight loss. We talked about diet and appetite. Ms. Krizan endorses she has been really trying to watch what she is eating. We talked about the weight loss making a difference with her breathing. She talked about wearing oxygen just at night. We talked about medical goals of care, remain aggressive, full code. We talked about role of palliative care and plan of care. We talked about quality of life and coping strategies. Therapeutic listening and emotional support provided. Praise  Ms Layne for being proactive  and trying to improve her health. We talked about importance of mobility. We talked about goals to think about with physical therapy. We talked about covid pandemic with social isolation and vaccine. We talked about follow up palliative care visit for one month, supportive visit. Ms Cooner in agreement. Appointment scheduled. Questions answered to satisfaction. Contact information provided.   Palliative Care was asked to help to continue to address goals of care.   CODE STATUS: Full code  PPS: 50% HOSPICE ELIGIBILITY/DIAGNOSIS: TBD  PAST MEDICAL HISTORY:  Past Medical History:  Diagnosis Date  . Acute on chronic diastolic (congestive) heart failure ([facility])   . Anemia    iron deficiency anemia - secondary to blood loss ( chronic)   . Arthritis    endstage changes bilateral knees/bilateral ankles.   . Asthma   . Carotid artery occlusion   . Chronic fatigue   . Chronic kidney disease   . Closed left hip fracture ([facility])   . Clotting disorder ([facility])    pt denies this  . Contusion of left knee    due to fall 1/14.  [name] COPD (chronic obstructive pulmonary disease) (HCC)    pulmonary fibrosis  . Depression, reactive   . Diabetes mellitus    type II   . Diastolic dysfunction   . Difficulty in walking   . Family history of heart disease   . Generalized muscle weakness   . Gout   . High cholesterol   . History of falling   . Hypertension   . Hypothyroidism   . Interstitial lung disease ([place])   . 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    . Morbid obesity ([place])   . Morbid obesity ([place])   . Neuromuscular disorder ([facility])    diabetic neuropathy   . Normal coronary arteries    cardiac catheterization performed  10/31/14  . RA (rheumatoid arthritis) ([facility])    has been off methotreaxte since 10/13.  [name] Rheumatoid arthritis ([facility])   . Shortness of breath   . Spinal stenosis  of lumbar region   . Thyroid disease   . Unspecified lack of coordination   . URI (upper respiratory infection)     SOCIAL HX:  Social History   Tobacco Use  . Smoking status: Never Smoker  . Smokeless tobacco: Never Used  Substance Use Topics  . Alcohol use: No    ALLERGIES:  Allergies  Allergen Reactions  . Codeine Swelling and Other (See Comments)    Facial swelling Chest pain and swelling in legs  Facial swelling  . Infliximab Anaphylaxis    "sent me into shock" "sent me into shock"  . Lisinopril Swelling    Face and neck swelling Face and neck swelling     PERTINENT MEDICATIONS:  Outpatient Encounter Medications as of 07/12/2019  Medication Sig  . acetaminophen (TYLENOL) 500 MG tablet Take 1,000 mg by mouth every 6 (six) hours as needed for moderate pain.   [name] albuterol (PROVENTIL HFA;VENTOLIN HFA) 108 (90 BASE) MCG/ACT inhaler Inhale 2 puffs into the lungs every 6 (six) hours as needed for wheezing or shortness of breath.  . allopurinol (ZYLOPRIM) 300 MG tablet Take 300 mg by mouth daily.  [name] aspirin buffered (BUFFERIN) 325 MG TABS tablet Take 325 mg by mouth daily.  [name] atorvastatin (LIPITOR) 20 MG tablet Take 10 mg by mouth every evening.   [name] azithromycin (ZITHROMAX) 500 MG tablet Take 1  tablet by mouth 3 (three) times a week. TAKE ON MON, WED & FRI  . Calcium Carb-Cholecalciferol (CALCIUM + D3 PO) Take 2 tablets by mouth daily with lunch.   . cholecalciferol (VITAMIN D) 1000 UNITS tablet Take 2,000 Units by mouth daily with lunch.   . colchicine 0.6 MG tablet Take 0.6 mg by mouth daily.  . cyclobenzaprine (FLEXERIL) 5 MG tablet Take 1 tablet (5 mg total) by mouth at bedtime as needed for muscle spasms. (Patient taking differently: Take 5 mg by mouth at bedtime. )  . diclofenac sodium (VOLTAREN) 1 % GEL Apply 2 g topically 3 (three) times daily. Both hands and knees  . DULoxetine (CYMBALTA) 60 MG capsule Take 1 capsule (60 mg total) by mouth daily.  . fluticasone  (FLONASE) 50 MCG/ACT nasal spray Place 2 sprays into both nostrils daily.  . furosemide (LASIX) 20 MG tablet Take 3 tablets (60 mg total) by mouth daily.  [name] gabapentin (NEURONTIN) 600 MG tablet Take two in the morning , Two mid- afternoon and two at bedtime (Patient taking differently: Take 1,200 mg by mouth 3 (three) times daily. Take two in the morning , Two mid- afternoon and two at bedtime)  . guaiFENesin (MUCINEX) 600 MG 12 hr tablet Take 1,200 mg by mouth 2 (two) times daily.  . hydroxychloroquine (PLAQUENIL) 200 MG tablet Take 400 mg by mouth daily.   [name] ipratropium-albuterol (DUONEB) 0.5-2.5 (3) MG/3ML SOLN Take 3 mLs by nebulization every 6 (six) hours as needed (cough/wheezing).   [name] leflunomide (ARAVA) 10 MG tablet Take 10 mg by mouth daily.  [name] levothyroxine (SYNTHROID) 50 MCG tablet Take 1 tablet (50 mcg total) by mouth daily.  [name] linaclotide (LINZESS) 72 MCG capsule Take 1 capsule (72 mcg total) by mouth daily before breakfast.  . metoprolol succinate (TOPROL XL) 25 MG 24 hr tablet Take 3 tablets (75 mg total) by mouth daily.  [name] morphine (MS CONTIN) 15 MG 12 hr tablet Take 1 tablet (15 mg total) by mouth every 12 (twelve) hours.  [name] morphine (MSIR) 15 MG tablet Take 1 tablet (15 mg total) by mouth 2 (two) times daily as needed for moderate pain (Breakthrough Pain).  . Multiple Vitamin (MULTIVITAMIN WITH MINERALS) TABS Take 1 tablet by mouth daily.   [name] olmesartan (BENICAR) 40 MG tablet Take 1 tablet (40 mg total) by mouth daily.  . OXYGEN-HELIUM IN Inhale 2 L into the lungs at bedtime. And on exertion  . pantoprazole (PROTONIX) 40 MG tablet take 1 tablet by mouth once daily MINUTES BEFORE 1ST MEAL OF THE DAY  . potassium chloride SA (KLOR-CON) 20 MEQ tablet Take 1 tablet (20 mEq total) by mouth daily.  . predniSONE (DELTASONE) 5 MG tablet Take 10 mg by mouth every morning.   . Semaglutide,0.25 or 0.5MG/DOS, (OZEMPIC, 0.25 OR 0.5 MG/DOSE,) 2 MG/1.5ML SOPN Inject 0.5 mg into the skin once a  week.  . sodium chloride (OCEAN) 0.65 % SOLN nasal spray Place 1 spray into both nostrils as needed for congestion.  [name] Spacer/Aero-Holding Chambers (AEROCHAMBER MV) inhaler Use as instructed   No facility-administered encounter medications on file as of 07/12/2019.    PHYSICAL EXAM:   deferred  Alister Staver Ihor Gully, NP

## 2019-07-13 ENCOUNTER — Telehealth: Payer: Self-pay

## 2019-07-13 NOTE — Telephone Encounter (Signed)
Return [name] call, questions answered, she states she will be scheduling her MRI. She is going to have the results forward to Dr Naaman Plummer as well, she states.

## 2019-07-13 NOTE — Telephone Encounter (Signed)
Went to [facility] for appt and could barely move and the doctor referred her to a Spine doctor/Dr. Donzetta Starch at [facility] did a x-ray and recommended a MRI. Wanted her to discuss Dr. Naaman Plummer or Zella Ball what to do further. Dr. Retta Mac did give her a referral for a MRI.

## 2019-07-20 ENCOUNTER — Telehealth: Payer: Self-pay | Admitting: Family Medicine

## 2019-07-20 ENCOUNTER — Other Ambulatory Visit: Payer: Self-pay

## 2019-07-20 DIAGNOSIS — E119 Type 2 diabetes mellitus without complications: Secondary | ICD-10-CM

## 2019-07-20 MED ORDER — OZEMPIC (0.25 OR 0.5 MG/DOSE) 2 MG/1.5ML ~~LOC~~ SOPN: 0.5000 mg | PEN_INJECTOR | SUBCUTANEOUS | 1 refills | Status: DC

## 2019-07-20 NOTE — Telephone Encounter (Signed)
Rx sent to pt pharmacy as requested

## 2019-07-20 NOTE — Telephone Encounter (Signed)
Pt is requesting a refill on Semaglutide 0.25 or 0.5 mg(OZEMPIC) please sent to [facility] by the mail. Thanks

## 2019-07-24 DIAGNOSIS — Z23 Encounter for immunization: Secondary | ICD-10-CM | POA: Diagnosis not present

## 2019-08-03 ENCOUNTER — Encounter: Payer: Medicare Other | Attending: Physical Medicine & Rehabilitation | Admitting: Physical Medicine & Rehabilitation

## 2019-08-03 ENCOUNTER — Encounter: Payer: Self-pay | Admitting: Physical Medicine & Rehabilitation

## 2019-08-03 ENCOUNTER — Other Ambulatory Visit: Payer: Self-pay

## 2019-08-03 ENCOUNTER — Telehealth: Payer: Self-pay | Admitting: Family Medicine

## 2019-08-03 DIAGNOSIS — M069 Rheumatoid arthritis, unspecified: Secondary | ICD-10-CM | POA: Insufficient documentation

## 2019-08-03 DIAGNOSIS — Z9889 Other specified postprocedural states: Secondary | ICD-10-CM | POA: Insufficient documentation

## 2019-08-03 DIAGNOSIS — M47816 Spondylosis without myelopathy or radiculopathy, lumbar region: Secondary | ICD-10-CM

## 2019-08-03 DIAGNOSIS — Z79891 Long term (current) use of opiate analgesic: Secondary | ICD-10-CM | POA: Insufficient documentation

## 2019-08-03 DIAGNOSIS — M48062 Spinal stenosis, lumbar region with neurogenic claudication: Secondary | ICD-10-CM | POA: Insufficient documentation

## 2019-08-03 DIAGNOSIS — E1142 Type 2 diabetes mellitus with diabetic polyneuropathy: Secondary | ICD-10-CM | POA: Insufficient documentation

## 2019-08-03 DIAGNOSIS — E079 Disorder of thyroid, unspecified: Secondary | ICD-10-CM

## 2019-08-03 DIAGNOSIS — M0579 Rheumatoid arthritis with rheumatoid factor of multiple sites without organ or systems involvement: Secondary | ICD-10-CM | POA: Diagnosis not present

## 2019-08-03 DIAGNOSIS — M170 Bilateral primary osteoarthritis of knee: Secondary | ICD-10-CM

## 2019-08-03 DIAGNOSIS — Z90710 Acquired absence of both cervix and uterus: Secondary | ICD-10-CM | POA: Insufficient documentation

## 2019-08-03 DIAGNOSIS — M7062 Trochanteric bursitis, left hip: Secondary | ICD-10-CM

## 2019-08-03 DIAGNOSIS — Z5181 Encounter for therapeutic drug level monitoring: Secondary | ICD-10-CM | POA: Insufficient documentation

## 2019-08-03 DIAGNOSIS — Z79899 Other long term (current) drug therapy: Secondary | ICD-10-CM | POA: Diagnosis not present

## 2019-08-03 DIAGNOSIS — M1711 Unilateral primary osteoarthritis, right knee: Secondary | ICD-10-CM

## 2019-08-03 DIAGNOSIS — J849 Interstitial pulmonary disease, unspecified: Secondary | ICD-10-CM | POA: Diagnosis not present

## 2019-08-03 DIAGNOSIS — G894 Chronic pain syndrome: Secondary | ICD-10-CM | POA: Insufficient documentation

## 2019-08-03 DIAGNOSIS — F329 Major depressive disorder, single episode, unspecified: Secondary | ICD-10-CM | POA: Insufficient documentation

## 2019-08-03 DIAGNOSIS — M1A00X0 Idiopathic chronic gout, unspecified site, without tophus (tophi): Secondary | ICD-10-CM | POA: Diagnosis not present

## 2019-08-03 DIAGNOSIS — M545 Low back pain: Secondary | ICD-10-CM | POA: Diagnosis not present

## 2019-08-03 DIAGNOSIS — I10 Essential (primary) hypertension: Secondary | ICD-10-CM

## 2019-08-03 DIAGNOSIS — M1712 Unilateral primary osteoarthritis, left knee: Secondary | ICD-10-CM

## 2019-08-03 DIAGNOSIS — E1122 Type 2 diabetes mellitus with diabetic chronic kidney disease: Secondary | ICD-10-CM | POA: Insufficient documentation

## 2019-08-03 MED ORDER — OLMESARTAN MEDOXOMIL 40 MG PO TABS: 40.0000 mg | ORAL_TABLET | Freq: Every day | ORAL | 0 refills | Status: DC

## 2019-08-03 MED ORDER — OLMESARTAN MEDOXOMIL 40 MG PO TABS: 40.0000 mg | ORAL_TABLET | Freq: Every day | ORAL | 1 refills | Status: DC

## 2019-08-03 MED ORDER — LEVOTHYROXINE SODIUM 50 MCG PO TABS: 50.0000 ug | ORAL_TABLET | Freq: Every day | ORAL | 0 refills | Status: DC

## 2019-08-03 MED ORDER — LEVOTHYROXINE SODIUM 50 MCG PO TABS: 50.0000 ug | ORAL_TABLET | Freq: Every day | ORAL | 1 refills | Status: DC

## 2019-08-03 MED ORDER — MORPHINE SULFATE ER 15 MG PO TBCR: 15.0000 mg | EXTENDED_RELEASE_TABLET | Freq: Two times a day (BID) | ORAL | 0 refills | Status: DC

## 2019-08-03 MED ORDER — MORPHINE SULFATE 15 MG PO TABS: 15.0000 mg | ORAL_TABLET | Freq: Two times a day (BID) | ORAL | 0 refills | Status: DC | PRN

## 2019-08-03 NOTE — Progress Notes (Signed)
Subjective:    Patient ID: [name], female    DOB: 05-15-1941, 79 y.o.   MRN: [id number]  HPI Due to national recommendations of social distancing because of COVID 50, an audio/video tele-health visit is felt to be the most appropriate encounter for this patient at this time. See MyChart message from today for the patient's consent to a tele-health encounter with [facility]. This is a follow up telephone visit for the patient who is at home. MD is at office.    [name] is meeting with me today regarding her chronic pain.  I last saw her in January we performed a Zilretta injection on her right knee.  She had great results with this.  We opted not for the left knee at that time as she felt she could hold off.  Since then her left knee is gradually gotten worse.  She would like to pursue Zilretta injection for the left knee.  She remains on MS Contin and morphine immediate release for pain control as well as her Cymbalta and gabapentin.  She received her first Covid vaccination last week and had we would like symptoms for about a week.  She just started feeling better over the last 24 hours or so.  Otherwise from a health standpoint she is doing quite well.  Pain Inventory Average Pain 7 Pain Right Now 5 My pain is intermittent, stabbing and aching  In the last 24 hours, has pain interfered with the following? General activity 7 Relation with others 7 Enjoyment of life 8 What TIME of day is your pain at its worst? morning, activity Sleep (in general) Fair  Pain is worse with: walking, bending, standing and some activites Pain improves with: heat/ice, medication and injections Relief from Meds: 5  Mobility walk with assistance use a walker ability to climb steps?  no do you drive?  no use a wheelchair  Function retired  Neuro/Psych weakness numbness tingling trouble walking spasms depression anxiety  Prior Studies Any  changes since last visit?  no  Physicians involved in your care Any changes since last visit?  no   Family History  Problem Relation Age of Onset  . Diabetes Mother   . Heart attack Mother   . Hypertension Father   . Lung cancer Father   . Diabetes Sister   . Diabetes Brother   . Hypertension Brother   . Heart disease Brother   . Heart attack Brother   . Kidney cancer Brother   . Uterine cancer Daughter   . Breast cancer Sister   . Rheum arthritis Maternal Uncle   . Gout Brother   . Kidney failure Brother        x 5   Social History   Socioeconomic History  . Marital status: Married    Spouse name: Not on file  . Number of children: 6  . Years of education: college  . Highest education level: Not on file  Occupational History  . Occupation: retired Marine scientist  Tobacco Use  . Smoking status: Never Smoker  . Smokeless tobacco: Never Used  Substance and Sexual Activity  . Alcohol use: No  . Drug use: No  . Sexual activity: Not Currently  Other Topics Concern  . Not on file  Social History Narrative   Patient consumes 2-3 cups coffee per day.   Social Determinants of Health   Financial Resource Strain:   . Difficulty of Paying Living Expenses:   Food  Insecurity:   . Worried About Charity fundraiser in the Last Year:   . Arboriculturist in the Last Year:   Transportation Needs:   . Film/video editor (Medical):   [name] Lack of Transportation (Non-Medical):   Physical Activity:   . Days of Exercise per Week:   . Minutes of Exercise per Session:   Stress:   . Feeling of Stress :   Social Connections:   . Frequency of Communication with Friends and Family:   . Frequency of Social Gatherings with Friends and Family:   . Attends Religious Services:   . Active Member of Clubs or Organizations:   . Attends Archivist Meetings:   [name] Marital Status:    Past Surgical History:  Procedure Laterality Date  . ABDOMINAL HYSTERECTOMY    . BRAIN SURGERY      Gamma knife 10/13. Needs repeat spring  '14  . CARDIAC CATHETERIZATION N/A 10/31/2014   Procedure: Right/Left Heart Cath and Coronary Angiography;  Surgeon: [name], MD;  Location: [facility];  Service: Cardiovascular;  Laterality: N/A;  . ESOPHAGOGASTRODUODENOSCOPY (EGD) WITH PROPOFOL N/A 09/14/2014   Procedure: ESOPHAGOGASTRODUODENOSCOPY (EGD) WITH PROPOFOL;  Surgeon: [name], MD;  Location: [facility];  Service: Endoscopy;  Laterality: N/A;  . INCISION AND DRAINAGE HIP Left 01/16/2017   Procedure: IRRIGATION AND DEBRIDEMENT LEFT HIP;  Surgeon: [name], MD;  Location: [facility];  Service: Orthopedics;  Laterality: Left;  . INTRAMEDULLARY (IM) NAIL INTERTROCHANTERIC Left 11/29/2016   Procedure: INTRAMEDULLARY (IM) NAIL INTERTROCHANTRIC;  Surgeon: [name], MD;  Location: [facility];  Service: Orthopedics;  Laterality: Left;  . OVARY SURGERY    . SHOULDER SURGERY Left   . TONSILLECTOMY  age 62  . VIDEO BRONCHOSCOPY Bilateral 05/31/2013   Procedure: VIDEO BRONCHOSCOPY WITHOUT FLUORO;  Surgeon: Brand Males, MD;  Location: [facility];  Service: Cardiopulmonary;  Laterality: Bilateral;  . video bronscoscopy  2000   lung   Past Medical History:  Diagnosis Date  . Acute on chronic diastolic (congestive) heart failure ([place])   . Anemia    iron deficiency anemia - secondary to blood loss ( chronic)   . Arthritis    endstage changes bilateral knees/bilateral ankles.   . Asthma   . Carotid artery occlusion   . Chronic fatigue   . Chronic kidney disease   . Closed left hip fracture ([facility])   . Clotting disorder ([place])    pt denies this  . Contusion of left knee    due to fall 1/14.  [name] COPD (chronic obstructive pulmonary disease) (HCC)    pulmonary fibrosis  . Depression, reactive   . Diabetes mellitus    type II   . Diastolic dysfunction   . Difficulty in walking   . Family history of heart disease   . Generalized muscle weakness   .  Gout   . High cholesterol   . History of falling   . Hypertension   . Hypothyroidism   . Interstitial lung disease ([place])   . 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    . Morbid obesity ([place])   . Morbid obesity ([place])   . Neuromuscular disorder ([place])    diabetic neuropathy   . Normal coronary arteries    cardiac catheterization performed  10/31/14  . RA (rheumatoid arthritis) (H[facility])    has been off methotreaxte since  10/13.  . Rheumatoid arthritis ([place])   . Shortness of breath   . Spinal stenosis of lumbar region   . Thyroid disease   . Unspecified lack of coordination   . URI (upper respiratory infection)    BP (!) 144/87   Pulse 91   Temp (!) 97.2 F (36.2 C)   Ht _0  (1.626 m)   Wt 248 lb (112.5 kg)   SpO2 92%   BMI 42.57 kg/m   Opioid Risk Score:   Fall Risk Score:  `1  Depression screen PHQ 2/9  Depression screen [facility] 2/9 06/11/2018 05/26/2018 12/09/2017 10/22/2017 09/22/2017 07/28/2017 06/30/2017  Decreased Interest _1 0 0 0 0  Down, Depressed, Hopeless _2 0 0 0 0  PHQ - 2 Score _3 0 0 0 0  Altered sleeping 2 2 - - - - -  Tired, decreased energy 2 3 - - - - -  Change in appetite 2 1 - - - - -  Feeling bad or failure about yourself  2 2 - - - - -  Trouble concentrating 2 0 - - - - -  Moving slowly or fidgety/restless 0 2 - - - - -  Suicidal thoughts 0 0 - - - - -  PHQ-9 Score 12 13 - - - - -  Difficult doing work/chores - Somewhat difficult - - - - -  Some recent data might be hidden    Review of Systems  HENT: Negative.   Eyes: Negative.   Respiratory: Positive for wheezing.   Cardiovascular: Negative.   Gastrointestinal: Negative.   Endocrine: Negative.   Genitourinary: Negative.   Musculoskeletal: Positive for arthralgias, back pain and gait problem.  Skin: Negative.   Neurological: Positive for numbness.       Tingling   Psychiatric/Behavioral: Positive for  dysphoric mood. The patient is nervous/anxious.   All other systems reviewed and are negative.      Objective:   Physical Exam   Assessment & Plan:  Assessment:  1. Lumbar spinal stenosis with neurogenic claudication. Associated facet arthropathy  2. Depression.  3. Diabetes mellitus type 2 with polyneuropathy  4. Rheumatoid arthritis and osteoarthritis. Multiple joint involvement including bilateral knees.  5. Left greater troch bursitis  6. Interstitial lung disease  7.Hx of left hip fracture, post-op infection in July 2017.     Plan:  1.maintain HEP as possible 2. Voltaren gel for fingers/knees.  -will arrange for zilretta injection of RIGHT knee on or around 08/24/19 3. MSO4 for breakthrough pain, one q8 prn #90-- RF today -MS Contin 50m CR q12 #60. RF today -We will continue the controlled substance monitoring program, this consists of regular clinic visits, examinations, routine drug screening, pill [place].   . 4 .Pulmonary mgt per pulmonologist. . 5. Continue cymbalta 673mdaily.  6. 9 min teletime was spent today. See me in 3 weeks

## 2019-08-03 NOTE — Telephone Encounter (Signed)
Rx sent per pt request 

## 2019-08-03 NOTE — Telephone Encounter (Signed)
Pt is calling in stating that she is needing new scripts for 90 days  Levothyroxine (SYNTHROID) 50 MCG and olmesartan (BENICAR) 40 MG.  Pt state that she is out of the medication and she would like 2 weeks worth to go to the local pharmacy [facility] on [address] and the rest to Pharm:  [facility] in [place].

## 2019-08-04 ENCOUNTER — Other Ambulatory Visit: Payer: Self-pay

## 2019-08-04 ENCOUNTER — Ambulatory Visit (INDEPENDENT_AMBULATORY_CARE_PROVIDER_SITE_OTHER): Payer: Medicare Other | Admitting: Pharmacist Clinician (PhC)/ Clinical Pharmacy Specialist

## 2019-08-04 DIAGNOSIS — I10 Essential (primary) hypertension: Secondary | ICD-10-CM

## 2019-08-04 NOTE — Progress Notes (Signed)
08/04/2019 Yolandra ALYZAH PELLY December 01, 1940 [id number]   HPI:  Ruchy F Eber is a 79 y.o. female patient of Dr Gwenlyn Found, with a PMH below who presents today for hypertension clinic evaluation.  She was seen by Dr. Gwenlyn Found last month and found to have a blood pressure of 159/95.  At that time he increased her metoprolol succ from 50 to 75 mg daily.  Since that appointment she has had her first covid vaccine.  She feels that after the vaccine she had increased fatigue and stomach upset which lasted for about 2 weeks.  She is feeling better now, but has her second dose in another week.  She had a DVT/PE last January (2020) which is attributed to having been on [place].  She took Eliqus for 3 months.      Today she is in the office for blood pressure evaluation.  Her home readings for this month to date (18 readings) averaged 143/88.  She has not had any issues with her current medications, but has not noticed any drop in home blood pressure readings since increasing the metoprolol.    Past Medical History: CHF Acute on chronic diastolic failure  CKD 12/6576 SCr 0.78  CrCl 52.2 with IBW  hyperlipidemia 05/2018:  TC 150, TG 111, HDL 65, LDL 63 - on atorvastatin 20 mg  DVT W/ presumed PE 05/2018 - took Eliquis x 3 months  Rheumatoid arthritis On hydroxychloroquine and leflunomide  DM2 with neuropathy A1c 7.4, on semaglutide, also gabapentin for neuropathy  hypothyroidism 12/20 TSH 2.256 on levothyroxine 50 mcg     Blood Pressure Goal:  130/80  Current Medications: olmesartan 40 mg, metoprolol succ 75 mg, furosemide 60 mg  Family Hx: father had hypertension, died from lung cancer at 77, mother died at 79 with MI, DM; siblings with hypertension, CKD and DM.  Six children - hypertension in 4, DM in 0, CKD in 1  Social Hx: no, no, 1 coffee per day  Diet: eats a lot of take out, sometimes son will cook; enjoys fruits, not much in the way of meats, not much fried foods, no added salt  Exercise:  PT  for past 6 weeks, now still doing some of those stretches and exercise  Home BP readings: home BP cuff is about 15-20 years [employer], home readings are on forearm, has gotten bigger over the years, doesn't fit on upper arm anymore  Intolerances: lisinopril - swelling (angioedema)  Labs: 04/2019:  Na 145, K 3.4, Glu 137, BUN 10, SCr 0.78  Wt Readings from Last 3 Encounters:  08/03/19 248 lb (112.5 kg)  07/06/19 252 lb 12.8 oz (114.7 kg)  06/21/19 249 lb 12.8 oz (113.3 kg)   BP Readings from Last 3 Encounters:  08/04/19 122/78  08/03/19 (!) 144/87  07/06/19 (!) 159/95   Pulse Readings from Last 3 Encounters:  08/04/19 89  08/03/19 91  07/06/19 95    Current Outpatient Medications  Medication Sig Dispense Refill  . acetaminophen (TYLENOL) 500 MG tablet Take 1,000 mg by mouth every 6 (six) hours as needed for moderate pain.     [name] albuterol (PROVENTIL HFA;VENTOLIN HFA) 108 (90 BASE) MCG/ACT inhaler Inhale 2 puffs into the lungs every 6 (six) hours as needed for wheezing or shortness of breath.    . allopurinol (ZYLOPRIM) 300 MG tablet Take 300 mg by mouth daily.    [name] aspirin buffered (BUFFERIN) 325 MG TABS tablet Take 325 mg by mouth daily.    [name] atorvastatin (  LIPITOR) 20 MG tablet Take 10 mg by mouth every evening.     [name] azithromycin (ZITHROMAX) 500 MG tablet Take 1 tablet by mouth 3 (three) times a week. TAKE ON MON, WED & FRI  0  . Calcium Carb-Cholecalciferol (CALCIUM + D3 PO) Take 2 tablets by mouth daily with lunch.     . cholecalciferol (VITAMIN D) 1000 UNITS tablet Take 2,000 Units by mouth daily with lunch.     . colchicine 0.6 MG tablet Take 0.6 mg by mouth daily.    . cyclobenzaprine (FLEXERIL) 5 MG tablet Take 1 tablet (5 mg total) by mouth at bedtime as needed for muscle spasms. (Patient taking differently: Take 5 mg by mouth at bedtime. ) 90 tablet 3  . diclofenac sodium (VOLTAREN) 1 % GEL Apply 2 g topically 3 (three) times daily. Both hands and knees 300 g 3  . DULoxetine  (CYMBALTA) 60 MG capsule Take 1 capsule (60 mg total) by mouth daily. 90 capsule 3  . fluticasone (FLONASE) 50 MCG/ACT nasal spray Place 2 sprays into both nostrils daily. 16 g 3  . furosemide (LASIX) 20 MG tablet Take 3 tablets (60 mg total) by mouth daily. 270 tablet 1  . gabapentin (NEURONTIN) 600 MG tablet Take two in the morning , Two mid- afternoon and two at bedtime (Patient taking differently: Take 1,200 mg by mouth 3 (three) times daily. Take two in the morning , Two mid- afternoon and two at bedtime) 540 tablet 3  . guaiFENesin (MUCINEX) 600 MG 12 hr tablet Take 1,200 mg by mouth 2 (two) times daily.    . hydroxychloroquine (PLAQUENIL) 200 MG tablet Take 400 mg by mouth daily.     [name] ipratropium-albuterol (DUONEB) 0.5-2.5 (3) MG/3ML SOLN Take 3 mLs by nebulization every 6 (six) hours as needed (cough/wheezing).     [name] leflunomide (ARAVA) 10 MG tablet Take 10 mg by mouth daily.    [name] levothyroxine (SYNTHROID) 50 MCG tablet Take 1 tablet (50 mcg total) by mouth daily. 15 tablet 0  . linaclotide (LINZESS) 72 MCG capsule Take 1 capsule (72 mcg total) by mouth daily before breakfast. 90 capsule 1  . metoprolol succinate (TOPROL XL) 25 MG 24 hr tablet Take 3 tablets (75 mg total) by mouth daily. 90 tablet 3  . morphine (MS CONTIN) 15 MG 12 hr tablet Take 1 tablet (15 mg total) by mouth every 12 (twelve) hours. 60 tablet 0  . morphine (MSIR) 15 MG tablet Take 1 tablet (15 mg total) by mouth 2 (two) times daily as needed for moderate pain (Breakthrough Pain). 60 tablet 0  . Multiple Vitamin (MULTIVITAMIN WITH MINERALS) TABS Take 1 tablet by mouth daily.     [name] olmesartan (BENICAR) 40 MG tablet Take 1 tablet (40 mg total) by mouth daily. 15 tablet 0  . OXYGEN-HELIUM IN Inhale 2 L into the lungs at bedtime. And on exertion    . pantoprazole (PROTONIX) 40 MG tablet take 1 tablet by mouth once daily MINUTES BEFORE 1ST MEAL OF THE DAY 90 tablet 1  . potassium chloride SA (KLOR-CON) 20 MEQ tablet Take 1  tablet (20 mEq total) by mouth daily. 90 tablet 3  . predniSONE (DELTASONE) 5 MG tablet Take 10 mg by mouth every morning.   0  . Semaglutide,0.25 or 0.5MG/DOS, (OZEMPIC, 0.25 OR 0.5 MG/DOSE,) 2 MG/1.5ML SOPN Inject 0.5 mg into the skin once a week. 3 pen 1  . sodium chloride (OCEAN) 0.65 % SOLN nasal spray Place 1 spray  into both nostrils as needed for congestion.    [name] Spacer/Aero-Holding Chambers (AEROCHAMBER MV) inhaler Use as instructed 1 each 0   No current facility-administered medications for this visit.    Allergies  Allergen Reactions  . Codeine Swelling and Other (See Comments)    Facial swelling Chest pain and swelling in legs  Facial swelling  . Infliximab Anaphylaxis    "sent me into shock" "sent me into shock"  . Lisinopril Swelling    Face and neck swelling Face and neck swelling    Past Medical History:  Diagnosis Date  . Acute on chronic diastolic (congestive) heart failure ([place])   . Anemia    iron deficiency anemia - secondary to blood loss ( chronic)   . Arthritis    endstage changes bilateral knees/bilateral ankles.   . Asthma   . Carotid artery occlusion   . Chronic fatigue   . Chronic kidney disease   . Closed left hip fracture ([place])   . Clotting disorder ([place])    pt denies this  . Contusion of left knee    due to fall 1/14.  [name] COPD (chronic obstructive pulmonary disease) (HCC)    pulmonary fibrosis  . Depression, reactive   . Diabetes mellitus    type II   . Diastolic dysfunction   . Difficulty in walking   . Family history of heart disease   . Generalized muscle weakness   . Gout   . High cholesterol   . History of falling   . Hypertension   . Hypothyroidism   . Interstitial lung disease ([place])   . 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    . Morbid obesity ([place])   . Morbid obesity ([place])   . Neuromuscular disorder ([place])    diabetic neuropathy     . Normal coronary arteries    cardiac catheterization performed  10/31/14  . RA (rheumatoid arthritis) ([place])    has been off methotreaxte since 10/13.  [name] Rheumatoid arthritis ([place])   . Shortness of breath   . Spinal stenosis of lumbar region   . Thyroid disease   . Unspecified lack of coordination   . URI (upper respiratory infection)     Blood pressure 122/78, pulse 89, resp. rate 15, height _0  (1.626 m), SpO2 100 %.  Essential hypertension Patient with systolic/diastolic hypertension, currently on olmesartan 40 and metoprolol succinate 75 mg, both once daily. In the office today her blood pressure appears to be controlled.  She admits that her home cuff is close to 78 years old and no longer fits on her upper arm.  Has been doing home checks on her forearm.  Advised that we will not change any medications for now, but she should look into a new BP machine with a large cuff.  Once she gets that she should continue with once daily BP checks.  Asked that she monitor readings and if they are consistently > 140/90 she should call the office.      Tommy Medal PharmD CPP [facility] [address] [place], [place] [address] [phone]

## 2019-08-04 NOTE — Assessment & Plan Note (Signed)
Patient with systolic/diastolic hypertension, currently on olmesartan 40 and metoprolol succinate 75 mg, both once daily. In the office today her blood pressure appears to be controlled.  She admits that her home cuff is close to 78 years old and no longer fits on her upper arm.  Has been doing home checks on her forearm.  Advised that we will not change any medications for now, but she should look into a new BP machine with a large cuff.  Once she gets that she should continue with once daily BP checks.  Asked that she monitor readings and if they are consistently > 140/90 she should call the office.

## 2019-08-04 NOTE — Patient Instructions (Signed)
Continue to monitor your blood pressure at home on a daily basis.  At the end of every week average those together to get just 1 number.   If you see these averaging > 140/90 please let us know.  You can call us direct at [phone] (Lota Leamer/Ra[place])  Check your blood pressure at home twice daily and keep record of the readings.  Take your BP meds as follows:  Continue with all current medications  Bring all of your meds, your BP cuff and your record of home blood pressures to your next appointment.  Exercise as you're able, try to walk approximately 30 minutes per day.  Keep salt intake to a minimum, especially watch canned and prepared boxed foods.  Eat more fresh fruits and vegetables and fewer canned items.  Avoid eating in fast food restaurants.    HOW TO TAKE YOUR BLOOD PRESSURE: . Rest 5 minutes before taking your blood pressure. .  Don't smoke or drink caffeinated beverages for at least 30 minutes before. . Take your blood pressure before (not after) you eat. . Sit comfortably with your back supported and both feet on the floor (don't cross your legs). . Elevate your arm to heart level on a table or a desk. . Use the proper sized cuff. It should fit smoothly and snugly around your bare upper arm. There should be enough room to slip a fingertip under the cuff. The bottom edge of the cuff should be 1 inch above the crease of the elbow. . Ideally, take 3 measurements at one sitting and record the average.

## 2019-08-14 DIAGNOSIS — Z23 Encounter for immunization: Secondary | ICD-10-CM | POA: Diagnosis not present

## 2019-08-15 ENCOUNTER — Other Ambulatory Visit: Payer: Self-pay | Admitting: Family Medicine

## 2019-08-15 ENCOUNTER — Telehealth: Payer: Self-pay | Admitting: Family Medicine

## 2019-08-15 DIAGNOSIS — E079 Disorder of thyroid, unspecified: Secondary | ICD-10-CM

## 2019-08-15 DIAGNOSIS — M0579 Rheumatoid arthritis with rheumatoid factor of multiple sites without organ or systems involvement: Secondary | ICD-10-CM | POA: Diagnosis not present

## 2019-08-15 DIAGNOSIS — I10 Essential (primary) hypertension: Secondary | ICD-10-CM

## 2019-08-15 NOTE — Telephone Encounter (Signed)
Pt is calling to let you know that she will be receiving her Olmesartan 40 mg in a few days. Per pt, the pharmacy did not send her the refills on time. Pt just wanted me to let you know since she spoke to you a few days ago regarding her medication. Thanks

## 2019-08-15 NOTE — Telephone Encounter (Signed)
Noted

## 2019-08-16 ENCOUNTER — Encounter: Payer: Self-pay | Admitting: Nurse Practitioner

## 2019-08-16 ENCOUNTER — Other Ambulatory Visit: Payer: Self-pay

## 2019-08-16 ENCOUNTER — Other Ambulatory Visit: Payer: Medicare Other | Admitting: Nurse Practitioner

## 2019-08-16 DIAGNOSIS — Z515 Encounter for palliative care: Secondary | ICD-10-CM | POA: Diagnosis not present

## 2019-08-16 DIAGNOSIS — M069 Rheumatoid arthritis, unspecified: Secondary | ICD-10-CM | POA: Diagnosis not present

## 2019-08-16 NOTE — Progress Notes (Signed)
[facility] Consult Note Telephone: [phone]  Fax: [phone]  PATIENT NAME: [name] DOB: Jun 28, 1940 MRN: [id number]  PRIMARY CARE PROVIDER:   [name], MD  REFERRING PROVIDER:  [name], MD [place],  [name] [address] RESPONSIBLE PARTY:Self; [phone]  Due to the COVID-19 crisis, this visit was done via telemedicine from my office and it was initiated and consent by this patient and or family.  RECOMMENDATIONS and PLAN: 1.ACP: full code currently with aggressive interventions.   2. RA; continue current regimen, pain management  3.Palliative care encounter Palliative medicine team will continue to support patient, patient's family, and medical team. Visit consisted of counseling and education dealing with the complex and emotionally intense issues of symptom management and palliative care in the setting of serious and potentially life-threatening illness  I spent 50 minutes providing this consultation,  from 10:00am to 10:50am. More than 50% of the time in this consultation was spent coordinating communication.   HISTORY OF PRESENT ILLNESS:  [name] is a 79 y.o. year old female with multiple medical problems including Chronic diastolic congestive heart failure, pulmonary fibrosis, interstitial lung disease, chronic respiratory failure with hypoxia, dysphasia, aphasia, carotid artery occlusion, COPD,history of DVT, chronic kidney disease, Rheumatoid arthritis, anemia, meningioma of left sphenoid wing involving cavernous sinus, hypothyroidism, hypertension, gerd, spinal stenosis of lumbar region,osteoarthritis, hypercholesterolemia, gout, morbid obesity, arthritis, cataract,chronic pain, history of left hip fracture s/p nail, tonsillectomy, left shoulder surgery, ovarian surgery, brain surgery, abdominal hysterectomy. 3 / 19 / [facility] appointment with Dr.  Toy Care. Rheumatoid arthritis involving multiple sites with positive rheumatoid factor with [name] stable and doing much better. Continue leflunomide, prednisone, hydroxychloroquine. Chronic gouty arthritis stable without flares continue allopurinol and colchicine as needed. Left-sided low back pain without sciatica with x-rays showing degeneration, MRI ordered by Dr. Margarette Canada  continue with spine specialist. Osteoarthritis bilateral knee has received an injection 05/2019 one by Orthopedics and continue to use Voltaren gel. Seen by Pulmonology 05/2019 for interstitial lung disease which remain stable. I called [name] for scheduled telemedicine telephonic is video not available follow-up palliative care visit. We talked about purpose of palliative care visit. [name] in agreement. We talked about how she was feeling today. [name] endorses that she is doing better. She does continue to have symptoms of pain in her knees at times so she has an upcoming appointment for a second injection. We talked about shortness of breath which she denies. We talked about her appetite, food choices. We talked about her functional ability continuing to slowly improve. [name] endorses now she is able to get up from her bed without requiring a lift. We talked about her doing more for herself though her family and continues to put limits out of concerns of doing too much. We talked about family dynamics, her supportive family and adult children that help her daily. Joan. [place]aber she has not taken as many naps. Joan. Pardue has been trying to stay up more during the day. Joan Osterlund endorses she feels like her fatigue and weakness has been improving. Joan. [place] endorses that she was able to get the first covid-19 and has the second one scheduled. We talked about overall clinical condition which has significantly improved from previous hospitalization and 12 / 2020. We talked about clinical progression and her Improvement. Praise  [name] for her hard work. We talked about medical goals of care including aggressive  versus conservative versus comfort care. We talked about code status as she currently has a full code. [name] was appreciative of further discussion and endorses her wishes are to continue to be a full code. She has discussed with her family. [name] endorses she would not want to be sustained on a ventilator long-term and her family knows her wishes. We talked about quality of life. We talked about role of palliative care and plan of care. [name] express that she was very thankful for palliative care visit, discussion. We talked about follow-up palliative care visit in person or by telephone. [name] expressed her preference is by telephone, scheduled in three months. [name] is aware that she can call for any questions or concerns. Therapeutic listening and emotional support provided. Contact information. Questions answered to satisfaction. Palliative Care was asked to help to continue to address goals of care.   CODE STATUS: full code  PPS: 50% HOSPICE ELIGIBILITY/DIAGNOSIS: TBD  PAST MEDICAL HISTORY:  Past Medical History:  Diagnosis Date  . Acute on chronic diastolic (congestive) heart failure ([place])   . Anemia    iron deficiency anemia - secondary to blood loss ( chronic)   . Arthritis    endstage changes bilateral knees/bilateral ankles.   . Asthma   . Carotid artery occlusion   . Chronic fatigue   . Chronic kidney disease   . Closed left hip fracture ([place])   . Clotting disorder ([place])    pt denies this  . Contusion of left knee    due to fall 1/14.  [name] COPD (chronic obstructive pulmonary disease) (HCC)    pulmonary fibrosis  . Depression, reactive   . Diabetes mellitus    type II   . Diastolic dysfunction   . Difficulty in walking   . Family history of heart disease   . Generalized muscle weakness   . Gout   . High cholesterol   . History of falling   . Hypertension    . Hypothyroidism   . Interstitial lung disease ([place])   . 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    . Morbid obesity ([place])   . Morbid obesity ([place])   . Neuromuscular disorder ([place])    diabetic neuropathy   . Normal coronary arteries    cardiac catheterization performed  10/31/14  . RA (rheumatoid arthritis) ([place])    has been off methotreaxte since 10/13.  [name] Rheumatoid arthritis ([place])   . Shortness of breath   . Spinal stenosis of lumbar region   . Thyroid disease   . Unspecified lack of coordination   . URI (upper respiratory infection)     SOCIAL HX:  Social History   Tobacco Use  . Smoking status: Never Smoker  . Smokeless tobacco: Never Used  Substance Use Topics  . Alcohol use: No    ALLERGIES:  Allergies  Allergen Reactions  . Codeine Swelling and Other (See Comments)    Facial swelling Chest pain and swelling in legs  Facial swelling  . Infliximab Anaphylaxis    "sent me into shock" "sent me into shock"  . Lisinopril Swelling    Face and neck swelling Face and neck swelling     PERTINENT MEDICATIONS:  Outpatient Encounter Medications as of 08/16/2019  Medication Sig  . acetaminophen (TYLENOL) 500 MG tablet Take 1,000 mg by mouth every 6 (six) hours as needed for moderate pain.   [name]  albuterol (PROVENTIL HFA;VENTOLIN HFA) 108 (90 BASE) MCG/ACT inhaler Inhale 2 puffs into the lungs every 6 (six) hours as needed for wheezing or shortness of breath.  . allopurinol (ZYLOPRIM) 300 MG tablet Take 300 mg by mouth daily.  [name] aspirin buffered (BUFFERIN) 325 MG TABS tablet Take 325 mg by mouth daily.  [name] atorvastatin (LIPITOR) 20 MG tablet Take 10 mg by mouth every evening.   [name] azithromycin (ZITHROMAX) 500 MG tablet Take 1 tablet by mouth 3 (three) times a week. TAKE ON MON, WED & FRI  . Calcium Carb-Cholecalciferol (CALCIUM + D3 PO) Take 2 tablets by mouth daily with lunch.   .  cholecalciferol (VITAMIN D) 1000 UNITS tablet Take 2,000 Units by mouth daily with lunch.   . colchicine 0.6 MG tablet Take 0.6 mg by mouth daily.  . cyclobenzaprine (FLEXERIL) 5 MG tablet Take 1 tablet (5 mg total) by mouth at bedtime as needed for muscle spasms. (Patient taking differently: Take 5 mg by mouth at bedtime. )  . diclofenac sodium (VOLTAREN) 1 % GEL Apply 2 g topically 3 (three) times daily. Both hands and knees  . DULoxetine (CYMBALTA) 60 MG capsule Take 1 capsule (60 mg total) by mouth daily.  . fluticasone (FLONASE) 50 MCG/ACT nasal spray Place 2 sprays into both nostrils daily.  . furosemide (LASIX) 20 MG tablet Take 3 tablets (60 mg total) by mouth daily.  [name] gabapentin (NEURONTIN) 600 MG tablet Take two in the morning , Two mid- afternoon and two at bedtime (Patient taking differently: Take 1,200 mg by mouth 3 (three) times daily. Take two in the morning , Two mid- afternoon and two at bedtime)  . guaiFENesin (MUCINEX) 600 MG 12 hr tablet Take 1,200 mg by mouth 2 (two) times daily.  . hydroxychloroquine (PLAQUENIL) 200 MG tablet Take 400 mg by mouth daily.   [name] ipratropium-albuterol (DUONEB) 0.5-2.5 (3) MG/3ML SOLN Take 3 mLs by nebulization every 6 (six) hours as needed (cough/wheezing).   [name] leflunomide (ARAVA) 10 MG tablet Take 10 mg by mouth daily.  [name] levothyroxine (SYNTHROID) 50 MCG tablet Take 1 tablet (50 mcg total) by mouth daily.  [name] linaclotide (LINZESS) 72 MCG capsule Take 1 capsule (72 mcg total) by mouth daily before breakfast.  . metoprolol succinate (TOPROL XL) 25 MG 24 hr tablet Take 3 tablets (75 mg total) by mouth daily.  [name] morphine (Joan CONTIN) 15 MG 12 hr tablet Take 1 tablet (15 mg total) by mouth every 12 (twelve) hours.  [name] morphine (MSIR) 15 MG tablet Take 1 tablet (15 mg total) by mouth 2 (two) times daily as needed for moderate pain (Breakthrough Pain).  . Multiple Vitamin (MULTIVITAMIN WITH MINERALS) TABS Take 1 tablet by mouth daily.   [name] olmesartan  (BENICAR) 40 MG tablet Take 1 tablet (40 mg total) by mouth daily.  . OXYGEN-HELIUM IN Inhale 2 L into the lungs at bedtime. And on exertion  . pantoprazole (PROTONIX) 40 MG tablet take 1 tablet by mouth once daily MINUTES BEFORE 1ST MEAL OF THE DAY  . potassium chloride SA (KLOR-CON) 20 MEQ tablet Take 1 tablet (20 mEq total) by mouth daily.  . predniSONE (DELTASONE) 5 MG tablet Take 10 mg by mouth every morning.   . Semaglutide,0.25 or 0.5MG/DOS, (OZEMPIC, 0.25 OR 0.5 MG/DOSE,) 2 MG/1.5ML SOPN Inject 0.5 mg into the skin once a week.  . sodium chloride (OCEAN) 0.65 % SOLN nasal spray Place 1 spray into both nostrils as needed for congestion.  [name] Spacer/Aero-Holding Josiah Lobo (  AEROCHAMBER MV) inhaler Use as instructed   No facility-administered encounter medications on file as of 08/16/2019.    PHYSICAL EXAM:   Deferred  Hollin Crewe Z Jamar Weatherall, NP

## 2019-08-24 ENCOUNTER — Ambulatory Visit (INDEPENDENT_AMBULATORY_CARE_PROVIDER_SITE_OTHER): Payer: Medicare Other | Admitting: Podiatry

## 2019-08-24 ENCOUNTER — Other Ambulatory Visit: Payer: Self-pay

## 2019-08-24 ENCOUNTER — Encounter: Payer: Self-pay | Admitting: Physical Medicine & Rehabilitation

## 2019-08-24 ENCOUNTER — Encounter: Payer: Medicare Other | Attending: Physical Medicine & Rehabilitation | Admitting: Physical Medicine & Rehabilitation

## 2019-08-24 ENCOUNTER — Encounter: Payer: Self-pay | Admitting: Podiatry

## 2019-08-24 VITALS — BP 151/77 | HR 97 | Temp 97.7°F

## 2019-08-24 VITALS — Temp 97.7°F

## 2019-08-24 DIAGNOSIS — G894 Chronic pain syndrome: Secondary | ICD-10-CM | POA: Diagnosis not present

## 2019-08-24 DIAGNOSIS — E1142 Type 2 diabetes mellitus with diabetic polyneuropathy: Secondary | ICD-10-CM

## 2019-08-24 DIAGNOSIS — Z5181 Encounter for therapeutic drug level monitoring: Secondary | ICD-10-CM | POA: Diagnosis not present

## 2019-08-24 DIAGNOSIS — F329 Major depressive disorder, single episode, unspecified: Secondary | ICD-10-CM | POA: Diagnosis not present

## 2019-08-24 DIAGNOSIS — M1712 Unilateral primary osteoarthritis, left knee: Secondary | ICD-10-CM | POA: Insufficient documentation

## 2019-08-24 DIAGNOSIS — M79676 Pain in unspecified toe(s): Secondary | ICD-10-CM | POA: Diagnosis not present

## 2019-08-24 DIAGNOSIS — B351 Tinea unguium: Secondary | ICD-10-CM | POA: Diagnosis not present

## 2019-08-24 DIAGNOSIS — Z90710 Acquired absence of both cervix and uterus: Secondary | ICD-10-CM | POA: Insufficient documentation

## 2019-08-24 DIAGNOSIS — Z79891 Long term (current) use of opiate analgesic: Secondary | ICD-10-CM | POA: Insufficient documentation

## 2019-08-24 DIAGNOSIS — M069 Rheumatoid arthritis, unspecified: Secondary | ICD-10-CM | POA: Diagnosis not present

## 2019-08-24 DIAGNOSIS — M48062 Spinal stenosis, lumbar region with neurogenic claudication: Secondary | ICD-10-CM | POA: Diagnosis not present

## 2019-08-24 DIAGNOSIS — D689 Coagulation defect, unspecified: Secondary | ICD-10-CM

## 2019-08-24 DIAGNOSIS — E1122 Type 2 diabetes mellitus with diabetic chronic kidney disease: Secondary | ICD-10-CM | POA: Insufficient documentation

## 2019-08-24 DIAGNOSIS — M1711 Unilateral primary osteoarthritis, right knee: Secondary | ICD-10-CM | POA: Insufficient documentation

## 2019-08-24 DIAGNOSIS — M47816 Spondylosis without myelopathy or radiculopathy, lumbar region: Secondary | ICD-10-CM | POA: Insufficient documentation

## 2019-08-24 DIAGNOSIS — M778 Other enthesopathies, not elsewhere classified: Secondary | ICD-10-CM | POA: Diagnosis not present

## 2019-08-24 DIAGNOSIS — Z9889 Other specified postprocedural states: Secondary | ICD-10-CM | POA: Insufficient documentation

## 2019-08-24 DIAGNOSIS — M7062 Trochanteric bursitis, left hip: Secondary | ICD-10-CM | POA: Diagnosis not present

## 2019-08-24 MED ORDER — MORPHINE SULFATE ER 15 MG PO TBCR: 15.0000 mg | EXTENDED_RELEASE_TABLET | Freq: Two times a day (BID) | ORAL | 0 refills | Status: DC

## 2019-08-24 MED ORDER — MORPHINE SULFATE 15 MG PO TABS: 15.0000 mg | ORAL_TABLET | Freq: Two times a day (BID) | ORAL | 0 refills | Status: DC | PRN

## 2019-08-24 NOTE — Progress Notes (Addendum)
This patient returns to my office for at risk foot care.  This patient requires this care by a professional since this patient will be at risk due to having diabetes, coagulation defect and history of DVT.  This patient is unable to cut nails herself since the patient cannot reach her nails.These nails are painful walking and wearing shoes.  This patient presents for at risk foot care today.  General Appearance  Alert, conversant and in no acute stress.  Vascular  Dorsalis pedis   pulses are palpable  Bilaterally.  Posterior tibial pulses are absent B/L due to swelling.  Capillary return is within normal limits  bilaterally. Temperature is within normal limits  bilaterally.  Neurologic  Senn-Weinstein monofilament wire test within normal limits right.  LOPS absent left foot.. Muscle power within normal limits bilaterally.  Nails Thick disfigured discolored nails with subungual debris  from hallux to fifth toes bilaterally. No evidence of bacterial infection or drainage bilaterally.  Orthopedic  No limitations of motion  feet .  No crepitus or effusions noted.  No bony pathology or digital deformities noted.  Skin  normotropic skin with no porokeratosis noted bilaterally.  No signs of infections or ulcers noted.     Onychomycosis  Pain in right toes  Pain in left toes  Consent was obtained for treatment procedures.   Mechanical debridement of nails 1-5  bilaterally performed with a nail nipper.  Filed with dremel without incident.  Dispense diabetic shoes.  Patient presents today and was dispensed 0ne pair ( two units) of medically necessary extra depth shoes with three pair( six units) of custom molded multiple density inserts. The shoes and the inserts are fitted to the patients ' feet and are noted to fit well and are free of defect.  Length and width of the shoes are also acceptable.  Patient was given written and verbal  instructions for wearing.  If any concerns arrive with the shoes or inserts,  the patient is to call the office.Patient is to follow up with doctor in six weeks.   Return office visit  3 months                    Told patient to return for periodic foot care and evaluation due to potential at risk complications.   Gardiner Barefoot DPM

## 2019-08-24 NOTE — Patient Instructions (Signed)
PLEASE FEEL FREE TO CALL OUR OFFICE WITH ANY PROBLEMS OR QUESTIONS ([phone])      

## 2019-08-24 NOTE — Progress Notes (Signed)
PROCEDURE NOTE  DIAGNOSIS:  OA bilateral knees  INTERVENTION:   ZILRETTA INJECTION     After informed consent and preparation of the skin with betadine and isopropyl alcohol, I injected 32MG of zilretta dissolved into 5cc of diluent into the both knees via anterolateral approach. Contents of syring were shaken vigorously and aspiration was performed prior to injection. The patient tolerated well, and no complications were encountered. Afterward the area was cleaned and dressed. Post- injection instructions were provided including ice  if swelling or pain should occur.    Meredith Staggers, MD, [facility] 08/24/2019

## 2019-08-27 LAB — DRUG TOX ALC METAB W/CON, ORAL FLD: Alcohol Metabolite: NEGATIVE ng/mL (ref <25)

## 2019-09-05 ENCOUNTER — Telehealth: Payer: Self-pay | Admitting: *Deleted

## 2019-09-05 NOTE — Telephone Encounter (Signed)
Alcohol was negative in oral swab. The drug portion of the testing was not performed due to error in ordering test.

## 2019-09-13 ENCOUNTER — Other Ambulatory Visit: Payer: Medicare Other | Admitting: Nurse Practitioner

## 2019-09-21 ENCOUNTER — Other Ambulatory Visit: Payer: Self-pay

## 2019-09-22 ENCOUNTER — Encounter: Payer: Self-pay | Admitting: Family Medicine

## 2019-09-22 ENCOUNTER — Other Ambulatory Visit: Payer: Self-pay

## 2019-09-22 ENCOUNTER — Ambulatory Visit (INDEPENDENT_AMBULATORY_CARE_PROVIDER_SITE_OTHER): Payer: Medicare Other | Admitting: Family Medicine

## 2019-09-22 VITALS — BP 130/80 | HR 98 | Temp 97.5°F | Wt 247.0 lb

## 2019-09-22 DIAGNOSIS — J849 Interstitial pulmonary disease, unspecified: Secondary | ICD-10-CM | POA: Diagnosis not present

## 2019-09-22 DIAGNOSIS — E039 Hypothyroidism, unspecified: Secondary | ICD-10-CM | POA: Diagnosis not present

## 2019-09-22 DIAGNOSIS — I10 Essential (primary) hypertension: Secondary | ICD-10-CM

## 2019-09-22 DIAGNOSIS — M05741 Rheumatoid arthritis with rheumatoid factor of right hand without organ or systems involvement: Secondary | ICD-10-CM | POA: Diagnosis not present

## 2019-09-22 DIAGNOSIS — E1140 Type 2 diabetes mellitus with diabetic neuropathy, unspecified: Secondary | ICD-10-CM

## 2019-09-22 DIAGNOSIS — E785 Hyperlipidemia, unspecified: Secondary | ICD-10-CM | POA: Diagnosis not present

## 2019-09-22 DIAGNOSIS — M05742 Rheumatoid arthritis with rheumatoid factor of left hand without organ or systems involvement: Secondary | ICD-10-CM | POA: Diagnosis not present

## 2019-09-22 DIAGNOSIS — Z0000 Encounter for general adult medical examination without abnormal findings: Secondary | ICD-10-CM | POA: Diagnosis not present

## 2019-09-22 DIAGNOSIS — K5903 Drug induced constipation: Secondary | ICD-10-CM

## 2019-09-22 DIAGNOSIS — E079 Disorder of thyroid, unspecified: Secondary | ICD-10-CM

## 2019-09-22 LAB — HEMOGLOBIN A1C: Hgb A1c MFr Bld: 7 % — ABNORMAL HIGH (ref 4.6–6.5)

## 2019-09-22 LAB — CBC WITH DIFFERENTIAL/PLATELET
Basophils Absolute: 0 10*3/uL (ref 0.0–0.1)
Basophils Relative: 0.5 % (ref 0.0–3.0)
Eosinophils Absolute: 0.1 10*3/uL (ref 0.0–0.7)
Eosinophils Relative: 1.3 % (ref 0.0–5.0)
HCT: 39.1 % (ref 36.0–46.0)
Hemoglobin: 12.7 g/dL (ref 12.0–15.0)
Lymphocytes Relative: 20.8 % (ref 12.0–46.0)
Lymphs Abs: 1.4 10*3/uL (ref 0.7–4.0)
MCHC: 32.3 g/dL (ref 30.0–36.0)
MCV: 96.1 fl (ref 78.0–100.0)
Monocytes Absolute: 0.5 10*3/uL (ref 0.1–1.0)
Monocytes Relative: 8 % (ref 3.0–12.0)
Neutro Abs: 4.7 10*3/uL (ref 1.4–7.7)
Neutrophils Relative %: 69.4 % (ref 43.0–77.0)
Platelets: 117 10*3/uL — ABNORMAL LOW (ref 150.0–400.0)
RBC: 4.07 Mil/uL (ref 3.87–5.11)
RDW: 16.1 % — ABNORMAL HIGH (ref 11.5–15.5)
WBC: 6.8 10*3/uL (ref 4.0–10.5)

## 2019-09-22 LAB — LIPID PANEL
Cholesterol: 161 mg/dL (ref 0–200)
HDL: 47.2 mg/dL (ref >39.00)
LDL Cholesterol: 75 mg/dL (ref 0–99)
NonHDL: 113.54
Total CHOL/HDL Ratio: 3
Triglycerides: 194 mg/dL — ABNORMAL HIGH (ref 0.0–149.0)
VLDL: 38.8 mg/dL (ref 0.0–40.0)

## 2019-09-22 LAB — BASIC METABOLIC PANEL
BUN: 20 mg/dL (ref 6–23)
CO2: 36 mEq/L — ABNORMAL HIGH (ref 19–32)
Calcium: 8.2 mg/dL — ABNORMAL LOW (ref 8.4–10.5)
Chloride: 105 mEq/L (ref 96–112)
Creatinine, Ser: 0.94 mg/dL (ref 0.40–1.20)
GFR: 69.72 mL/min (ref >60.00)
Glucose, Bld: 144 mg/dL — ABNORMAL HIGH (ref 70–99)
Potassium: 4.3 mEq/L (ref 3.5–5.1)
Sodium: 144 mEq/L (ref 135–145)

## 2019-09-22 LAB — TSH: TSH: 1.84 u[IU]/mL (ref 0.35–4.50)

## 2019-09-22 MED ORDER — FLUTICASONE PROPIONATE 50 MCG/ACT NA SUSP: 2.0000 | Freq: Every day | NASAL | 3 refills | Status: DC

## 2019-09-22 MED ORDER — OLMESARTAN MEDOXOMIL 40 MG PO TABS: 40.0000 mg | ORAL_TABLET | Freq: Every day | ORAL | 1 refills | Status: DC

## 2019-09-22 MED ORDER — LEVOTHYROXINE SODIUM 50 MCG PO TABS: 50.0000 ug | ORAL_TABLET | Freq: Every day | ORAL | 1 refills | Status: DC

## 2019-09-22 MED ORDER — PANTOPRAZOLE SODIUM 40 MG PO TBEC: DELAYED_RELEASE_TABLET | ORAL | 1 refills | Status: DC

## 2019-09-22 MED ORDER — METOPROLOL SUCCINATE ER 25 MG PO TB24: 75.0000 mg | ORAL_TABLET | Freq: Every day | ORAL | 3 refills | Status: DC

## 2019-09-22 MED ORDER — FUROSEMIDE 20 MG PO TABS: 60.0000 mg | ORAL_TABLET | Freq: Every day | ORAL | 1 refills | Status: DC

## 2019-09-22 MED ORDER — LINACLOTIDE 72 MCG PO CAPS: 72.0000 ug | ORAL_CAPSULE | Freq: Every day | ORAL | 1 refills | Status: DC

## 2019-09-22 NOTE — Progress Notes (Signed)
Pt accompanied by her daughter. Subjective:   [name] is a 79 y.o. female who presents for Medicare Annual (Subsequent) preventive examination.  Pt states she is doing well.  Her breathing has improved some and she is not requiring O2 when ambulating/leaving the house for ILD.  Wearing 2L O2 at night.  Pt does not feeling somewhat isolated 2/2 not being able to go anywhere d/t COVID-19 pandemic.  BP typically 140s-180s/80-100.  Followed by Cardiology.  Needs refills on meds.  Denies LE edema or SOB, hasn't taken lasix yet due to having this appt.  Pt noticed slight increase in fsbs, now 130s in am and 140s-150s in evening.  Pt notes eating spaghetti weekly.  May have a sub sandwich for lunch.  Also eating several servings of fruit daily and cake or pie regularly.  Taking synthroid 50 mcg.    Discussed living will, POA, HCPOA.  Pt states she started the process, but is having trouble finishing the forms as she does not want to upset some people.  Pt and her daughter note they received a bill from [facility] for several visits.  Unclear why his has started.  Review of Systems:    General: Denies fever, chills, night sweats, changes in weight, changes in appetite HEENT: Denies headaches, ear pain, changes in vision, rhinorrhea, sore throat CV: Denies CP, palpitations, SOB, orthopnea Pulm: Denies SOB, cough, wheezing GI: Denies abdominal pain, nausea, vomiting, diarrhea, constipation GU: Denies dysuria, hematuria, frequency, vaginal discharge Msk: Denies muscle cramps, joint pains Neuro: Denies weakness, numbness, tingling Skin: Denies rashes, bruising Psych: Denies depression, anxiety, hallucinations  Objective:     Vitals: BP 130/80 (BP Location: Left Arm, Patient Position: Sitting, Cuff Size: Large)   Pulse 98   Temp (!) 97.5 F (36.4 C) (Temporal)   Wt 247 lb (112 kg)   SpO2 93%   BMI 42.40 kg/m   There is no height or weight on file to calculate BMI.   Gen.  Pleasant, well developed, well-nourished, in NAD HEENT - [place]/AT, PERRL, EOMI, no scleral icterus, no nasal drainage, pharynx without erythema or exudate. Neck: No JVD, no thyromegaly, no carotid bruits Lungs: no use of accessory muscles, CTAB, no wheezes, rales or rhonchi Cardiovascular: RRR, No r/g/m, trace peripheral edema in LEs Abdomen: BS present, soft, nontender,nondistended Musculoskeletal: No deformities, moves all four extremities, no cyanosis or clubbing, normal tone Neuro:  A&Ox3, CN II-XII intact, gait not assessed as pt in wheelchair Skin:  Warm, dry, intact, no lesions Psych: normal affect, mood appropriate   Advanced Directives 09/22/2019 04/22/2019 04/22/2019 04/21/2019 06/02/2018 01/13/2018 03/10/2017  Does Patient Have a Medical Advance Directive? Yes - - No No Yes Yes  Type of Advance Directive Living will - - - Public librarian;Living will [place];Living will  Does patient want to make changes to medical advance directive? - - - - - - No - Patient declined  Copy of [place] in Chart? - - - - - No - copy requested -  Would patient like information on creating a medical advance directive? - No - Patient declined No - Patient declined No - Patient declined No - Patient declined - -  Pre-existing out of facility DNR order (yellow form or pink MOST form) - - - - - - -    Tobacco Social History   Tobacco Use  Smoking Status Never Smoker  Smokeless Tobacco Never Used     Counseling given: Not  Answered  Past Medical History:  Diagnosis Date  . Acute on chronic diastolic (congestive) heart failure ([place])   . Anemia    iron deficiency anemia - secondary to blood loss ( chronic)   . Arthritis    endstage changes bilateral knees/bilateral ankles.   . Asthma   . Carotid artery occlusion   . Chronic fatigue   . Chronic kidney disease   . Closed left hip fracture ([place])   . Clotting disorder ([place])    pt denies this  .  Contusion of left knee    due to fall 1/14.  [name] COPD (chronic obstructive pulmonary disease) (HCC)    pulmonary fibrosis  . Depression, reactive   . Diabetes mellitus    type II   . Diastolic dysfunction   . Difficulty in walking   . Family history of heart disease   . Generalized muscle weakness   . Gout   . High cholesterol   . History of falling   . Hypertension   . Hypothyroidism   . Interstitial lung disease ([place])   . 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    . Morbid obesity ([place])   . Morbid obesity ([place])   . Neuromuscular disorder ([place])    diabetic neuropathy   . Normal coronary arteries    cardiac catheterization performed  10/31/14  . RA (rheumatoid arthritis) ([place])    has been off methotreaxte since 10/13.  [name] Rheumatoid arthritis ([place])   . Shortness of breath   . Spinal stenosis of lumbar region   . Thyroid disease   . Unspecified lack of coordination   . URI (upper respiratory infection)    Past Surgical History:  Procedure Laterality Date  . ABDOMINAL HYSTERECTOMY    . BRAIN SURGERY     Gamma knife 10/13. Needs repeat spring  '14  . CARDIAC CATHETERIZATION N/A 10/31/2014   Procedure: Right/Left Heart Cath and Coronary Angiography;  Surgeon: [name], MD;  Location: [facility];  Service: Cardiovascular;  Laterality: N/A;  . ESOPHAGOGASTRODUODENOSCOPY (EGD) WITH PROPOFOL N/A 09/14/2014   Procedure: ESOPHAGOGASTRODUODENOSCOPY (EGD) WITH PROPOFOL;  Surgeon: [name], MD;  Location: [facility];  Service: Endoscopy;  Laterality: N/A;  . INCISION AND DRAINAGE HIP Left 01/16/2017   Procedure: IRRIGATION AND DEBRIDEMENT LEFT HIP;  Surgeon: [name], MD;  Location: [facility];  Service: Orthopedics;  Laterality: Left;  . INTRAMEDULLARY (IM) NAIL INTERTROCHANTERIC Left 11/29/2016   Procedure: INTRAMEDULLARY (IM) NAIL INTERTROCHANTRIC;  Surgeon:  [name], MD;  Location: [facility];  Service: Orthopedics;  Laterality: Left;  . OVARY SURGERY    . SHOULDER SURGERY Left   . TONSILLECTOMY  age 9  . VIDEO BRONCHOSCOPY Bilateral 05/31/2013   Procedure: VIDEO BRONCHOSCOPY WITHOUT FLUORO;  Surgeon: Brand Males, MD;  Location: [facility];  Service: Cardiopulmonary;  Laterality: Bilateral;  . video bronscoscopy  2000   lung   Family History  Problem Relation Age of Onset  . Diabetes Mother   . Heart attack Mother   . Hypertension Father   . Lung cancer Father   . Diabetes Sister   . Diabetes Brother   . Hypertension Brother   . Heart disease Brother   . Heart attack Brother   . Kidney cancer Brother   . Uterine cancer Daughter   . Breast cancer Sister   . Rheum arthritis Maternal Uncle   . Gout  Brother   . Kidney failure Brother        x 5   Social History   Socioeconomic History  . Marital status: Married    Spouse name: Not on file  . Number of children: 6  . Years of education: college  . Highest education level: Not on file  Occupational History  . Occupation: retired Marine scientist  Tobacco Use  . Smoking status: Never Smoker  . Smokeless tobacco: Never Used  Substance and Sexual Activity  . Alcohol use: No  . Drug use: No  . Sexual activity: Not Currently  Other Topics Concern  . Not on file  Social History Narrative   Patient consumes 2-3 cups coffee per day.   Social Determinants of Health   Financial Resource Strain:   . Difficulty of Paying Living Expenses:   Food Insecurity:   . Worried About Charity fundraiser in the Last Year:   . Arboriculturist in the Last Year:   Transportation Needs:   . Film/video editor (Medical):   [name] Lack of Transportation (Non-Medical):   Physical Activity:   . Days of Exercise per Week:   . Minutes of Exercise per Session:   Stress:   . Feeling of Stress :   Social Connections:   . Frequency of Communication with Friends and Family:   . Frequency of  Social Gatherings with Friends and Family:   . Attends Religious Services:   . Active Member of Clubs or Organizations:   . Attends Archivist Meetings:   [name] Marital Status:     Outpatient Encounter Medications as of 09/22/2019  Medication Sig  . acetaminophen (TYLENOL) 500 MG tablet Take 1,000 mg by mouth every 6 (six) hours as needed for moderate pain.   [name] albuterol (PROVENTIL HFA;VENTOLIN HFA) 108 (90 BASE) MCG/ACT inhaler Inhale 2 puffs into the lungs every 6 (six) hours as needed for wheezing or shortness of breath.  . allopurinol (ZYLOPRIM) 300 MG tablet Take 300 mg by mouth daily.  [name] aspirin buffered (BUFFERIN) 325 MG TABS tablet Take 325 mg by mouth daily.  [name] atorvastatin (LIPITOR) 20 MG tablet Take 10 mg by mouth every evening.   [name] azithromycin (ZITHROMAX) 500 MG tablet Take 1 tablet by mouth 3 (three) times a week. TAKE ON MON, WED & FRI  . Calcium Carb-Cholecalciferol (CALCIUM + D3 PO) Take 2 tablets by mouth daily with lunch.   . cholecalciferol (VITAMIN D) 1000 UNITS tablet Take 2,000 Units by mouth daily with lunch.   . colchicine 0.6 MG tablet Take 0.6 mg by mouth daily.  . cyclobenzaprine (FLEXERIL) 5 MG tablet Take 1 tablet (5 mg total) by mouth at bedtime as needed for muscle spasms. (Patient taking differently: Take 5 mg by mouth at bedtime. )  . diclofenac sodium (VOLTAREN) 1 % GEL Apply 2 g topically 3 (three) times daily. Both hands and knees  . DULoxetine (CYMBALTA) 60 MG capsule Take 1 capsule (60 mg total) by mouth daily.  . fluticasone (FLONASE) 50 MCG/ACT nasal spray Place 2 sprays into both nostrils daily.  . furosemide (LASIX) 20 MG tablet Take 3 tablets (60 mg total) by mouth daily.  [name] gabapentin (NEURONTIN) 600 MG tablet Take two in the morning , Two mid- afternoon and two at bedtime (Patient taking differently: Take 1,200 mg by mouth 3 (three) times daily. Take two in the morning , Two mid- afternoon and two at bedtime)  . guaiFENesin (MUCINEX) 600 MG 12  hr tablet Take 1,200 mg by mouth 2 (two) times daily.  . hydroxychloroquine (PLAQUENIL) 200 MG tablet Take 400 mg by mouth daily.   [name] ipratropium-albuterol (DUONEB) 0.5-2.5 (3) MG/3ML SOLN Take 3 mLs by nebulization every 6 (six) hours as needed (cough/wheezing).   [name] leflunomide (ARAVA) 10 MG tablet Take 10 mg by mouth daily.  [name] levothyroxine (SYNTHROID) 50 MCG tablet Take 1 tablet (50 mcg total) by mouth daily.  [name] linaclotide (LINZESS) 72 MCG capsule Take 1 capsule (72 mcg total) by mouth daily before breakfast.  . metoprolol succinate (TOPROL XL) 25 MG 24 hr tablet Take 3 tablets (75 mg total) by mouth daily.  [name] morphine (MS CONTIN) 15 MG 12 hr tablet Take 1 tablet (15 mg total) by mouth every 12 (twelve) hours.  [name] morphine (MSIR) 15 MG tablet Take 1 tablet (15 mg total) by mouth 2 (two) times daily as needed for moderate pain (Breakthrough Pain).  . Multiple Vitamin (MULTIVITAMIN WITH MINERALS) TABS Take 1 tablet by mouth daily.   [name] olmesartan (BENICAR) 40 MG tablet Take 1 tablet (40 mg total) by mouth daily.  . OXYGEN-HELIUM IN Inhale 2 L into the lungs at bedtime. And on exertion  . pantoprazole (PROTONIX) 40 MG tablet take 1 tablet by mouth once daily MINUTES BEFORE 1ST MEAL OF THE DAY  . potassium chloride SA (KLOR-CON) 20 MEQ tablet Take 1 tablet (20 mEq total) by mouth daily.  . predniSONE (DELTASONE) 5 MG tablet Take 10 mg by mouth every morning.   . Semaglutide,0.25 or 0.5MG/DOS, (OZEMPIC, 0.25 OR 0.5 MG/DOSE,) 2 MG/1.5ML SOPN Inject 0.5 mg into the skin once a week.  . sodium chloride (OCEAN) 0.65 % SOLN nasal spray Place 1 spray into both nostrils as needed for congestion.  [name] Spacer/Aero-Holding Chambers (AEROCHAMBER MV) inhaler Use as instructed   No facility-administered encounter medications on file as of 09/22/2019.    Activities of Daily Living In your present state of health, do you have any difficulty performing the following activities: 04/22/2019  Hearing? N  Vision? N    Difficulty concentrating or making decisions? N  Walking or climbing stairs? Y  Dressing or bathing? N  Doing errands, shopping? Y  Some recent data might be hidden    Patient Care Team: [name], MD as PCP - General (Family Medicine) Lorretta Harp, MD as PCP - Cardiology (Cardiology) Duffy, Creola Corn, LCSW as Social Worker (Licensed Clinical Social Worker)    Assessment:   This is a routine wellness examination for [name].  Fall Risk Fall Risk  08/24/2019 08/03/2019 06/01/2019 04/04/2019 10/12/2018  Falls in the past year? 1 0 0 0 0  Comment - - - - -  Number falls in past yr: 0 - - - -  Comment - - - - -  Injury with Fall? 0 - - - -  Comment - - - - -  Risk Factor Category  - - - - -  Risk for fall due to : - - - - -  Risk for fall due to: Comment - - - - -  Follow up - - - - -  Comment - - - - -   Is the patient's home free of loose throw rugs in walkways, pet beds, electrical cords, etc?   yes      Grab bars in the bathroom? yes      Handrails on the stairs?   yes      Adequate lighting?  yes  Depression Screen PHQ 2/9 Scores 09/22/2019 06/11/2018 05/26/2018 12/09/2017  PHQ - 2 Score _0 PHQ- 9 Score _1 -     Immunization History  Administered Date(s) Administered  . Fluad Quad(high Dose 65+) 02/02/2019  . Influenza Split 01/29/2012, 03/30/2013, 02/10/2014  . Influenza, High Dose Seasonal PF 02/16/2017, 02/11/2018, 01/31/2019  . Influenza,inj,Quad PF,6+ Mos 02/09/2015, 12/18/2015  . Influenza-Unspecified 02/09/2015, 12/18/2015  . PPD Test 04/23/2015  . Pneumococcal Conjugate-13 08/21/2014  . Pneumococcal Polysaccharide-23 02/17/2012  . Tdap 12/27/2015    Qualifies for Shingles Vaccine? yes  Screening Tests Health Maintenance  Topic Date Due  . COVID-19 Vaccine (1) Never done  . INFLUENZA VACCINE  12/18/2019  . OPHTHALMOLOGY EXAM  03/09/2020  . HEMOGLOBIN A1C  03/24/2020  . FOOT EXAM  05/23/2020  . TETANUS/TDAP  12/26/2025  . DEXA SCAN   Completed  . PNA vac Low Risk Adult  Completed    Cancer Screenings: Lung: Low Dose CT Chest recommended if Age 71-80 years, 30 pack-year currently smoking OR have quit w/in 15years. Patient does not qualify.   Never smoked. Breast:  Up to date on Mammogram? N/a 2/2 age Up to date of Bone Density/Dexa? Yes Colorectal: done in 2014   Plan:    pt is a 79 yo female seen for f/u on chronic conditions and AWV.  Essential hypertension  -liable.  Controlled in clinic -discussed lifestyle modification -continue current meds: olmesartan 40 mg, lasix 60 mg, metoprolol succinate 75 mg daily - Plan: Basic metabolic panel  ILD (interstitial lung disease) ([place]) -continue 2L O2 at night, albuterol inhaler prn, duonebs prn,  -continue f/u with Pulm and Palliative care.  -advised to contact Palliative care in regards to receiving a bill.  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  Pt advised to decreased the intake of fruits and carbs -continue checking fsbs regularly. - Plan: CBC with Differential/Platelet, Hemoglobin A1c  Acquired hypothyroidism  -continue synthroid 50 mcg daily -will make dose adjustments if needed based on labs - Plan: TSH  Dyslipidemia  -continue Atorvastatin 20 mg - Plan: Lipid panel  RA involving hands with positive RF -continue f/u with Rheumatology -continue plaquenil  I have personally reviewed and noted the following in the patient's chart:   . Medical and social history . Use of alcohol, tobacco or illicit drugs  . Current medications and supplements . Functional ability and status . Nutritional status . Physical activity . Advanced directives . List of other physicians . Hospitalizations, surgeries, and ER visits in previous 12 months . Vitals . Screenings to include cognitive, depression, and falls . Referrals and  appointments  In addition, I have reviewed and discussed with patient certain preventive protocols, quality metrics, and best practice recommendations. A written personalized care plan for preventive services as well as general preventive health recommendations were provided to patient.     [name], MD  09/22/2019

## 2019-09-23 ENCOUNTER — Other Ambulatory Visit: Payer: Self-pay

## 2019-09-23 DIAGNOSIS — I10 Essential (primary) hypertension: Secondary | ICD-10-CM

## 2019-09-26 ENCOUNTER — Other Ambulatory Visit (INDEPENDENT_AMBULATORY_CARE_PROVIDER_SITE_OTHER): Payer: Medicare Other

## 2019-09-26 ENCOUNTER — Other Ambulatory Visit: Payer: Self-pay

## 2019-09-26 DIAGNOSIS — I10 Essential (primary) hypertension: Secondary | ICD-10-CM | POA: Diagnosis not present

## 2019-09-26 LAB — BASIC METABOLIC PANEL
BUN: 25 mg/dL — ABNORMAL HIGH (ref 6–23)
CO2: 36 mEq/L — ABNORMAL HIGH (ref 19–32)
Calcium: 8.3 mg/dL — ABNORMAL LOW (ref 8.4–10.5)
Chloride: 100 mEq/L (ref 96–112)
Creatinine, Ser: 1.33 mg/dL — ABNORMAL HIGH (ref 0.40–1.20)
GFR: 46.71 mL/min — ABNORMAL LOW (ref >60.00)
Glucose, Bld: 163 mg/dL — ABNORMAL HIGH (ref 70–99)
Potassium: 3.5 mEq/L (ref 3.5–5.1)
Sodium: 144 mEq/L (ref 135–145)

## 2019-09-28 ENCOUNTER — Other Ambulatory Visit: Payer: Self-pay | Admitting: Family Medicine

## 2019-10-12 DIAGNOSIS — I5189 Other ill-defined heart diseases: Secondary | ICD-10-CM | POA: Diagnosis not present

## 2019-10-12 DIAGNOSIS — E1122 Type 2 diabetes mellitus with diabetic chronic kidney disease: Secondary | ICD-10-CM | POA: Diagnosis not present

## 2019-10-12 DIAGNOSIS — N2581 Secondary hyperparathyroidism of renal origin: Secondary | ICD-10-CM | POA: Diagnosis not present

## 2019-10-12 DIAGNOSIS — N183 Chronic kidney disease, stage 3 unspecified: Secondary | ICD-10-CM | POA: Diagnosis not present

## 2019-10-12 DIAGNOSIS — I129 Hypertensive chronic kidney disease with stage 1 through stage 4 chronic kidney disease, or unspecified chronic kidney disease: Secondary | ICD-10-CM | POA: Diagnosis not present

## 2019-10-12 DIAGNOSIS — R109 Unspecified abdominal pain: Secondary | ICD-10-CM | POA: Diagnosis not present

## 2019-10-13 ENCOUNTER — Other Ambulatory Visit: Payer: Self-pay

## 2019-10-13 ENCOUNTER — Other Ambulatory Visit: Payer: Medicare Other | Admitting: Nurse Practitioner

## 2019-10-13 ENCOUNTER — Encounter: Payer: Self-pay | Admitting: Cardiovascular Disease

## 2019-10-13 ENCOUNTER — Ambulatory Visit (INDEPENDENT_AMBULATORY_CARE_PROVIDER_SITE_OTHER): Payer: Medicare Other | Admitting: Cardiovascular Disease

## 2019-10-13 VITALS — BP 126/84 | HR 94 | Ht 64.0 in | Wt 254.2 lb

## 2019-10-13 DIAGNOSIS — E785 Hyperlipidemia, unspecified: Secondary | ICD-10-CM | POA: Diagnosis not present

## 2019-10-13 DIAGNOSIS — R079 Chest pain, unspecified: Secondary | ICD-10-CM | POA: Diagnosis not present

## 2019-10-13 DIAGNOSIS — I10 Essential (primary) hypertension: Secondary | ICD-10-CM | POA: Diagnosis not present

## 2019-10-13 DIAGNOSIS — I5033 Acute on chronic diastolic (congestive) heart failure: Secondary | ICD-10-CM | POA: Diagnosis not present

## 2019-10-13 NOTE — Progress Notes (Signed)
10/13/2019 [name]   08-Sep-1940  [id number]  Primary Physician [name], MD Primary Cardiologist: Lorretta Harp MD [name], [place]  HPI:  [name] is a 79 y.o.  moderately overweight married African American female mother of 6 children, grandmother of a grandchildren referred by [name] for cardiovascular evaluation because of progressive dyspnea on exertion and chest pain. I last saw her in the office 07/06/2019.  She did see Kerin Ransom, PA-C in the office 06/25/2018.  Her cardiovascular risk factor are notable for a strong family history of heart disease with her mother and 84 brothers old with an ischemic heart disease. She has never had a heart attack or stroke. She does have interstitial lung disease as well as rheumatoid arthritis. She is on home oxygen. She also has a brain tumor/meningioma 2 with gamma knife therapy She's had progressive disabling dyspnea as well as daily chest pain. She underwent cardiac catheterization 10/31/14 by Dr. Irish Lack revealing normal coronary arteries and normal LV function. She did have mild pulmonary hypertension. 2-D echo showed normal LV function with grade 1 diastolic dysfunction and mild pulmonary hypertension. Her symptoms really have not changed on by mouth diuretics. She was referred to [facility] for further evaluation and treatment of interstitial lung disease. Since beginning an antibiotic and an inhaled bronchodilator her shortness of breath is somewhat improved.  She had an episode of syncope prior to seeing Kerin Ransom thought to be related to overmedication and dehydration from poor p.o. intake.  Her meds were adjusted.  She was also hospitalized last month with sepsis related to a tooth infection with and was in the hospital for 10 days.  She had fluid overload and has since diuresed.  She gets occasional atypical chest pain.  Her blood pressure is mildly elevated when she takes it at  home.  She is on furosemide 60 mg a day.  Since I saw her 3 months ago she has seen our Pharm.D. for medication titration for hypertension which is under better control.  Over the last week or 2 she is noticed increasing weight gain lower extremity edema as well as some atypical chest pain and left flank pain.  She did see her nephrologist who put her on antibiotic for presumed UTI and increased her diuretic from furosemide 60 to 80 mg a day.  Her symptoms sound like she has an "upper urinary tract infection" which may have exacerbated her diastolic heart failure.  Her nephrologist is following her volume status and renal function closely.   Current Meds  Medication Sig  . acetaminophen (TYLENOL) 500 MG tablet Take 1,000 mg by mouth every 6 (six) hours as needed for moderate pain.   [name] albuterol (PROVENTIL HFA;VENTOLIN HFA) 108 (90 BASE) MCG/ACT inhaler Inhale 2 puffs into the lungs every 6 (six) hours as needed for wheezing or shortness of breath.  . allopurinol (ZYLOPRIM) 300 MG tablet Take 300 mg by mouth daily.  [name] aspirin buffered (BUFFERIN) 325 MG TABS tablet Take 325 mg by mouth daily.  [name] atorvastatin (LIPITOR) 20 MG tablet Take 10 mg by mouth every evening.   [name] azithromycin (ZITHROMAX) 500 MG tablet Take 1 tablet by mouth 3 (three) times a week. TAKE ON MON, WED & FRI  . Calcium Carb-Cholecalciferol (CALCIUM + D3 PO) Take 2 tablets by mouth daily with lunch.   . cholecalciferol (VITAMIN D) 1000 UNITS tablet Take 2,000 Units by mouth daily with lunch.   [name]  colchicine 0.6 MG tablet Take 0.6 mg by mouth daily.  . cyclobenzaprine (FLEXERIL) 5 MG tablet Take 1 tablet (5 mg total) by mouth at bedtime as needed for muscle spasms. (Patient taking differently: Take 5 mg by mouth at bedtime. )  . diclofenac sodium (VOLTAREN) 1 % GEL Apply 2 g topically 3 (three) times daily. Both hands and knees  . DULoxetine (CYMBALTA) 60 MG capsule Take 1 capsule (60 mg total) by mouth daily.  . fluticasone (FLONASE)  50 MCG/ACT nasal spray Place 2 sprays into both nostrils daily.  . furosemide (LASIX) 80 MG tablet Take 80 mg by mouth.  . gabapentin (NEURONTIN) 600 MG tablet Take two in the morning , Two mid- afternoon and two at bedtime (Patient taking differently: Take 1,200 mg by mouth 3 (three) times daily. Take two in the morning , Two mid- afternoon and two at bedtime)  . guaiFENesin (MUCINEX) 600 MG 12 hr tablet Take 1,200 mg by mouth 2 (two) times daily.  . hydroxychloroquine (PLAQUENIL) 200 MG tablet Take 400 mg by mouth daily.   [name] ipratropium-albuterol (DUONEB) 0.5-2.5 (3) MG/3ML SOLN Take 3 mLs by nebulization every 6 (six) hours as needed (cough/wheezing).   [name] leflunomide (ARAVA) 10 MG tablet Take 10 mg by mouth daily.  [name] levothyroxine (SYNTHROID) 50 MCG tablet Take 1 tablet (50 mcg total) by mouth daily.  [name] linaclotide (LINZESS) 72 MCG capsule Take 1 capsule (72 mcg total) by mouth daily before breakfast.  . metoprolol succinate (TOPROL XL) 25 MG 24 hr tablet Take 3 tablets (75 mg total) by mouth daily.  [name] morphine (MS CONTIN) 15 MG 12 hr tablet Take 1 tablet (15 mg total) by mouth every 12 (twelve) hours.  [name] morphine (MSIR) 15 MG tablet Take 1 tablet (15 mg total) by mouth 2 (two) times daily as needed for moderate pain (Breakthrough Pain).  . Multiple Vitamin (MULTIVITAMIN WITH MINERALS) TABS Take 1 tablet by mouth daily.   [name] olmesartan (BENICAR) 40 MG tablet Take 1 tablet (40 mg total) by mouth daily.  . OXYGEN-HELIUM IN Inhale 2 L into the lungs at bedtime. And on exertion  . pantoprazole (PROTONIX) 40 MG tablet take 1 tablet by mouth once daily MINUTES BEFORE 1ST MEAL OF THE DAY  . potassium chloride SA (KLOR-CON) 20 MEQ tablet Take 1 tablet (20 mEq total) by mouth daily.  . predniSONE (DELTASONE) 5 MG tablet Take 10 mg by mouth every morning.   . Semaglutide,0.25 or 0.5MG/DOS, (OZEMPIC, 0.25 OR 0.5 MG/DOSE,) 2 MG/1.5ML SOPN Inject 0.5 mg into the skin once a week.  . sodium chloride (OCEAN)  0.65 % SOLN nasal spray Place 1 spray into both nostrils as needed for congestion.  [name] Spacer/Aero-Holding Chambers (AEROCHAMBER MV) inhaler Use as instructed     Allergies  Allergen Reactions  . Codeine Swelling and Other (See Comments)    Facial swelling Chest pain and swelling in legs  Facial swelling  . Infliximab Anaphylaxis    "sent me into shock" "sent me into shock"  . Lisinopril Swelling    Face and neck swelling Face and neck swelling    Social History   Socioeconomic History  . Marital status: Married    Spouse name: Not on file  . Number of children: 6  . Years of education: college  . Highest education level: Not on file  Occupational History  . Occupation: retired Marine scientist  Tobacco Use  . Smoking status: Never Smoker  . Smokeless tobacco: Never Used  Substance  and Sexual Activity  . Alcohol use: No  . Drug use: No  . Sexual activity: Not Currently  Other Topics Concern  . Not on file  Social History Narrative   Patient consumes 2-3 cups coffee per day.   Social Determinants of Health   Financial Resource Strain:   . Difficulty of Paying Living Expenses:   Food Insecurity:   . Worried About Charity fundraiser in the Last Year:   . Arboriculturist in the Last Year:   Transportation Needs:   . Film/video editor (Medical):   [name] Lack of Transportation (Non-Medical):   Physical Activity:   . Days of Exercise per Week:   . Minutes of Exercise per Session:   Stress:   . Feeling of Stress :   Social Connections:   . Frequency of Communication with Friends and Family:   . Frequency of Social Gatherings with Friends and Family:   . Attends Religious Services:   . Active Member of Clubs or Organizations:   . Attends Archivist Meetings:   [name] Marital Status:   Intimate Partner Violence:   . Fear of Current or Ex-Partner:   . Emotionally Abused:   [name] Physically Abused:   . Sexually Abused:      Review of Systems: General: negative for  chills, fever, night sweats or weight changes.  Cardiovascular: negative for chest pain, dyspnea on exertion, edema, orthopnea, palpitations, paroxysmal nocturnal dyspnea or shortness of breath Dermatological: negative for rash Respiratory: negative for cough or wheezing Urologic: negative for hematuria Abdominal: negative for nausea, vomiting, diarrhea, bright red blood per rectum, melena, or hematemesis Neurologic: negative for visual changes, syncope, or dizziness All other systems reviewed and are otherwise negative except as noted above.    Blood pressure 126/84, pulse 94, height _0  (1.626 m), weight 254 lb 3.2 oz (115.3 kg), SpO2 98 %.  General appearance: alert and no distress Neck: no adenopathy, no carotid bruit, no JVD, supple, symmetrical, trachea midline and thyroid not enlarged, symmetric, no tenderness/mass/nodules Lungs: clear to auscultation bilaterally Heart: regular rate and rhythm, S1, S2 normal, no murmur, click, rub or gallop Extremities: 2+ pitting edema bilaterally Pulses: 2+ and symmetric Skin: Skin color, texture, turgor normal. No rashes or lesions Neurologic: Alert and oriented X 3, normal strength and tone. Normal symmetric reflexes. Normal coordination and gait  EKG sinus rhythm at 94 with left axis deviation.  I personally reviewed this EKG.  ASSESSMENT AND PLAN:   Dyslipidemia History of dyslipidemia on statin therapy with lipid profile performed 09/22/2019 revealing total cholesterol of 161, LDL 75 and HDL 47.  Chest pain with moderate risk of acute coronary syndrome Atypical chest pain with known normal coronary arteries by cath performed by Dr. Irish Lack 10/31/2014.  Acute on chronic diastolic heart failure (HCC) History of chronic diastolic heart failure with echo performed 06/02/2018 revealing normal LV systolic function.  She is on high-dose diuretics.  Essential hypertension History of essential hypertension with blood pressure measured today at  126/84.  She is on metoprolol and Benicar.  Her blood pressures have been better controlled recently.      Lorretta Harp MD FACP,FACC,FAHA, [facility] 10/13/2019 3:30 PM

## 2019-10-13 NOTE — Assessment & Plan Note (Signed)
Atypical chest pain with known normal coronary arteries by cath performed by Dr. Irish Lack 10/31/2014.

## 2019-10-13 NOTE — Assessment & Plan Note (Signed)
History of essential hypertension with blood pressure measured today at 126/84.  She is on metoprolol and Benicar.  Her blood pressures have been better controlled recently.

## 2019-10-13 NOTE — Assessment & Plan Note (Signed)
History of chronic diastolic heart failure with echo performed 06/02/2018 revealing normal LV systolic function.  She is on high-dose diuretics.

## 2019-10-13 NOTE — Patient Instructions (Addendum)
Medication Instructions:  Your physician recommends that you continue on your current medications as directed. Please refer to the Current Medication list given to you today.  *If you need a refill on your cardiac medications before your next appointment, please call your pharmacy*   Follow-Up: At [facility], you and your health needs are our priority.  As part of our continuing mission to provide you with exceptional heart care, we have created designated Provider Care Teams.  These Care Teams include your primary Cardiologist (physician) and Advanced Practice Providers (APPs -  Physician Assistants and Nurse Practitioners) who all work together to provide you with the care you need, when you need it.  We recommend signing up for the patient portal called "MyChart".  Sign up information is provided on this After Visit Summary.  MyChart is used to connect with patients for Virtual Visits (Telemedicine).  Patients are able to view lab/test results, encounter notes, upcoming appointments, etc.  Non-urgent messages can be sent to your provider as well.   To learn more about what you can do with MyChart, go to [email or web address].    Your next appointment:   4 weeks with Kerin Ransom PA 3 months with Dr. Gwenlyn Found

## 2019-10-13 NOTE — Assessment & Plan Note (Signed)
History of dyslipidemia on statin therapy with lipid profile performed 09/22/2019 revealing total cholesterol of 161, LDL 75 and HDL 47.

## 2019-10-14 ENCOUNTER — Other Ambulatory Visit: Payer: Self-pay | Admitting: Nephrology

## 2019-10-14 DIAGNOSIS — N183 Chronic kidney disease, stage 3 unspecified: Secondary | ICD-10-CM

## 2019-10-14 DIAGNOSIS — R109 Unspecified abdominal pain: Secondary | ICD-10-CM

## 2019-10-19 ENCOUNTER — Encounter: Payer: Self-pay | Admitting: Physical Medicine & Rehabilitation

## 2019-10-19 ENCOUNTER — Encounter: Payer: Medicare Other | Attending: Physical Medicine & Rehabilitation | Admitting: Physical Medicine & Rehabilitation

## 2019-10-19 ENCOUNTER — Other Ambulatory Visit: Payer: Self-pay

## 2019-10-19 VITALS — BP 124/81 | HR 99 | Temp 97.5°F | Ht 64.0 in | Wt 251.0 lb

## 2019-10-19 DIAGNOSIS — E1122 Type 2 diabetes mellitus with diabetic chronic kidney disease: Secondary | ICD-10-CM | POA: Insufficient documentation

## 2019-10-19 DIAGNOSIS — M1712 Unilateral primary osteoarthritis, left knee: Secondary | ICD-10-CM | POA: Diagnosis not present

## 2019-10-19 DIAGNOSIS — M069 Rheumatoid arthritis, unspecified: Secondary | ICD-10-CM | POA: Diagnosis not present

## 2019-10-19 DIAGNOSIS — M1711 Unilateral primary osteoarthritis, right knee: Secondary | ICD-10-CM | POA: Diagnosis not present

## 2019-10-19 DIAGNOSIS — Z5181 Encounter for therapeutic drug level monitoring: Secondary | ICD-10-CM

## 2019-10-19 DIAGNOSIS — M7062 Trochanteric bursitis, left hip: Secondary | ICD-10-CM

## 2019-10-19 DIAGNOSIS — Z9889 Other specified postprocedural states: Secondary | ICD-10-CM | POA: Insufficient documentation

## 2019-10-19 DIAGNOSIS — M47816 Spondylosis without myelopathy or radiculopathy, lumbar region: Secondary | ICD-10-CM

## 2019-10-19 DIAGNOSIS — G894 Chronic pain syndrome: Secondary | ICD-10-CM | POA: Diagnosis not present

## 2019-10-19 DIAGNOSIS — E1142 Type 2 diabetes mellitus with diabetic polyneuropathy: Secondary | ICD-10-CM | POA: Insufficient documentation

## 2019-10-19 DIAGNOSIS — Z90710 Acquired absence of both cervix and uterus: Secondary | ICD-10-CM | POA: Diagnosis not present

## 2019-10-19 DIAGNOSIS — Z79891 Long term (current) use of opiate analgesic: Secondary | ICD-10-CM

## 2019-10-19 DIAGNOSIS — M48062 Spinal stenosis, lumbar region with neurogenic claudication: Secondary | ICD-10-CM | POA: Diagnosis present

## 2019-10-19 DIAGNOSIS — F329 Major depressive disorder, single episode, unspecified: Secondary | ICD-10-CM | POA: Diagnosis not present

## 2019-10-19 MED ORDER — MORPHINE SULFATE 15 MG PO TABS: 15.0000 mg | ORAL_TABLET | Freq: Two times a day (BID) | ORAL | 0 refills | Status: DC | PRN

## 2019-10-19 MED ORDER — MORPHINE SULFATE ER 15 MG PO TBCR: 15.0000 mg | EXTENDED_RELEASE_TABLET | Freq: Two times a day (BID) | ORAL | 0 refills | Status: DC

## 2019-10-19 NOTE — Patient Instructions (Signed)
PLEASE FEEL FREE TO CALL OUR OFFICE WITH ANY PROBLEMS OR QUESTIONS ([phone])      

## 2019-10-19 NOTE — Progress Notes (Signed)
Subjective:    Patient ID: [name], female    DOB: 1940-08-05, 79 y.o.   MRN: [id number]  HPI  [name] is here in follow up of her chronic pain. She had EXCELLENT results with the zilretta injections. Her pain levels dropped substantially.   However about 1-2 weeks ago she developed left sided back and leg pain. She was treated for a UTI and ended up passing some blood. She may have had some stones. She is seeing a urology. The back pain has since improved.   She is back to walking again with her walker. She wants to get back to her cane but understands she needs the walker still. She is using MS CR and MS IR for pain control.     Pain Inventory Average Pain 7 Pain Right Now 5 My pain is sharp and stabbing  In the last 24 hours, has pain interfered with the following? General activity 7 Relation with others 7 Enjoyment of life 7 What TIME of day is your pain at its worst? morning Sleep (in general) Good  Pain is worse with: walking, bending, standing and some activites Pain improves with: heat/ice, therapy/exercise, pacing activities and medication Relief from Meds: 7  Mobility walk with assistance use a walker ability to climb steps?  no do you drive?  no  Function retired I need assistance with the following:  dressing, meal prep, household duties and shopping  Neuro/Psych bladder control problems weakness numbness tingling trouble walking anxiety  Prior Studies Any changes since last visit?  no  Physicians involved in your care Any changes since last visit?  no   Family History  Problem Relation Age of Onset  . Diabetes Mother   . Heart attack Mother   . Hypertension Father   . Lung cancer Father   . Diabetes Sister   . Diabetes Brother   . Hypertension Brother   . Heart disease Brother   . Heart attack Brother   . Kidney cancer Brother   . Uterine cancer Daughter   . Breast cancer Sister   . Rheum arthritis Maternal Uncle   .  Gout Brother   . Kidney failure Brother        x 5   Social History   Socioeconomic History  . Marital status: Married    Spouse name: Not on file  . Number of children: 6  . Years of education: college  . Highest education level: Not on file  Occupational History  . Occupation: retired Marine scientist  Tobacco Use  . Smoking status: Never Smoker  . Smokeless tobacco: Never Used  Substance and Sexual Activity  . Alcohol use: No  . Drug use: No  . Sexual activity: Not Currently  Other Topics Concern  . Not on file  Social History Narrative   Patient consumes 2-3 cups coffee per day.   Social Determinants of Health   Financial Resource Strain:   . Difficulty of Paying Living Expenses:   Food Insecurity:   . Worried About Charity fundraiser in the Last Year:   . Arboriculturist in the Last Year:   Transportation Needs:   . Film/video editor (Medical):   [name] Lack of Transportation (Non-Medical):   Physical Activity:   . Days of Exercise per Week:   . Minutes of Exercise per Session:   Stress:   . Feeling of Stress :   Social Connections:   . Frequency of Communication with Friends and  Family:   . Frequency of Social Gatherings with Friends and Family:   . Attends Religious Services:   . Active Member of Clubs or Organizations:   . Attends Archivist Meetings:   [name] Marital Status:    Past Surgical History:  Procedure Laterality Date  . ABDOMINAL HYSTERECTOMY    . BRAIN SURGERY     Gamma knife 10/13. Needs repeat spring  '14  . CARDIAC CATHETERIZATION N/A 10/31/2014   Procedure: Right/Left Heart Cath and Coronary Angiography;  Surgeon: [name], MD;  Location: [place] [facility];  Service: Cardiovascular;  Laterality: N/A;  . ESOPHAGOGASTRODUODENOSCOPY (EGD) WITH PROPOFOL N/A 09/14/2014   Procedure: ESOPHAGOGASTRODUODENOSCOPY (EGD) WITH PROPOFOL;  Surgeon: [name], MD;  Location: [facility];  Service: Endoscopy;  Laterality: N/A;  . INCISION  AND DRAINAGE HIP Left 01/16/2017   Procedure: IRRIGATION AND DEBRIDEMENT LEFT HIP;  Surgeon: [name], MD;  Location: [facility];  Service: Orthopedics;  Laterality: Left;  . INTRAMEDULLARY (IM) NAIL INTERTROCHANTERIC Left 11/29/2016   Procedure: INTRAMEDULLARY (IM) NAIL INTERTROCHANTRIC;  Surgeon: [name], MD;  Location: [place];  Service: Orthopedics;  Laterality: Left;  . OVARY SURGERY    . SHOULDER SURGERY Left   . TONSILLECTOMY  age 76  . VIDEO BRONCHOSCOPY Bilateral 05/31/2013   Procedure: VIDEO BRONCHOSCOPY WITHOUT FLUORO;  Surgeon: Brand Males, MD;  Location: [place];  Service: Cardiopulmonary;  Laterality: Bilateral;  . video bronscoscopy  2000   lung   Past Medical History:  Diagnosis Date  . Acute on chronic diastolic (congestive) heart failure ([place])   . Anemia    iron deficiency anemia - secondary to blood loss ( chronic)   . Arthritis    endstage changes bilateral knees/bilateral ankles.   . Asthma   . Carotid artery occlusion   . Chronic fatigue   . Chronic kidney disease   . Closed left hip fracture ([place])   . Clotting disorder ([place])    pt denies this  . Contusion of left knee    due to fall 1/14.  [name] COPD (chronic obstructive pulmonary disease) (HCC)    pulmonary fibrosis  . Depression, reactive   . Diabetes mellitus    type II   . Diastolic dysfunction   . Difficulty in walking   . Family history of heart disease   . Generalized muscle weakness   . Gout   . High cholesterol   . History of falling   . Hypertension   . Hypothyroidism   . Interstitial lung disease ([place])   . 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    . Morbid obesity ([place])   . Morbid obesity ([place])   . Neuromuscular disorder ([place])    diabetic neuropathy   . Normal coronary arteries    cardiac catheterization performed  10/31/14  . RA (rheumatoid arthritis) ([place])    has  been off methotreaxte since 10/13.  [name] Rheumatoid arthritis ([place])   . Shortness of breath   . Spinal stenosis of lumbar region   . Thyroid disease   . Unspecified lack of coordination   . URI (upper respiratory infection)    Temp (!) 97.5 F (36.4 C)   Ht _0  (1.626 m)   Wt 251 lb (113.9 kg)   BMI 43.08 kg/m   Opioid Risk Score:   Fall Risk Score:  `1  Depression screen PHQ 2/9  Depression  screen [facility] 2/9 09/22/2019 06/11/2018 05/26/2018 12/09/2017 10/22/2017 09/22/2017 07/28/2017  Decreased Interest _0 0 0 0  Down, Depressed, Hopeless _1 0 0 0  PHQ - 2 Score _2 0 0 0  Altered sleeping _3 - - - -  Tired, decreased energy _4 - - - -  Change in appetite _5 - - - -  Feeling bad or failure about yourself  _6 - - - -  Trouble concentrating 2 2 0 - - - -  Moving slowly or fidgety/restless 1 0 2 - - - -  Suicidal thoughts 0 0 0 - - - -  PHQ-9 Score _7 - - - -  Difficult doing work/chores Somewhat difficult - Somewhat difficult - - - -  Some recent data might be hidden    Review of Systems  Constitutional: Negative.   HENT: Negative.   Eyes: Negative.   Respiratory: Negative.   Cardiovascular: Negative.   Gastrointestinal: Positive for constipation.  Endocrine: Negative.   Genitourinary: Positive for difficulty urinating.  Musculoskeletal: Positive for arthralgias, back pain and gait problem.  Allergic/Immunologic: Negative.   Neurological: Positive for weakness and numbness.       Tingling   Hematological: Negative.   Psychiatric/Behavioral: The patient is nervous/anxious.   All other systems reviewed and are negative.      Objective:   Physical Exam Gen: no distress, normal appearing, obese HEENT: oral mucosa pink and moist, NCAT Cardio: Reg rate Chest: normal effort, normal rate of breathing Abd: soft, non-distended Ext: 1++ LE edema Skin: intact Neuro: 5/5 LE strength. Stocking glove sensory loss.  Musculoskeletal: mild effusions  bilateral knees, less antalgic gait. Walked into/out of exam room today! Psych: pleasant, normal affect        Assessment & Plan:  Assessment:  1. Lumbar spinal stenosis with neurogenic claudication. Associated facet arthropathy  2. Depression.  3. Diabetes mellitus type 2 with polyneuropathy  4. Rheumatoid arthritis and osteoarthritis. Multiple joint involvement including bilateral knees.  5. Left greater troch bursitis  6. Interstitial lung disease  7. Hx of left hip fracture, post-op infection in July 2017.          Plan:  1.maintain HEP as possible for low back and knees. Keep walking! 2. Voltaren gel for fingers/knees.  -s/p zilretta injection of bilateral knees 08/24/19 with good results 3. MSO4 for breakthrough pain, one q8 prn #90-- RF today -MS Contin 35m CR q12 #60.  RF today -We will continue the controlled substance monitoring program, this consists of regular clinic visits, examinations, routine drug screening, pill [place] well [place].   -Medication was refilled and a second prescription was sent to the patient's pharmacy for next month.   -UDS today.    4 .Compression with zipped TEDS/ACE for LE edema,   -elevation as able in the evening. 5. Continue cymbalta  640mdaily.  6. Fifteen minutes of face to face patient care time were spent during this visit. All questions were encouraged and answered.  Follow up with NP in 2 mos.  .[name]

## 2019-10-22 LAB — TOXASSURE SELECT,+ANTIDEPR,UR

## 2019-10-24 ENCOUNTER — Ambulatory Visit
Admission: RE | Admit: 2019-10-24 | Discharge: 2019-10-24 | Disposition: A | Discharge status: Home / Self Care | Payer: Medicare Other | Source: Ambulatory Visit | Attending: Nephrology | Admitting: Nephrology

## 2019-10-24 DIAGNOSIS — R109 Unspecified abdominal pain: Secondary | ICD-10-CM

## 2019-10-24 DIAGNOSIS — N183 Chronic kidney disease, stage 3 unspecified: Secondary | ICD-10-CM

## 2019-10-26 ENCOUNTER — Telehealth: Payer: Self-pay | Admitting: *Deleted

## 2019-10-26 ENCOUNTER — Ambulatory Visit: Payer: Medicare Other | Admitting: Physical Medicine & Rehabilitation

## 2019-10-26 DIAGNOSIS — I129 Hypertensive chronic kidney disease with stage 1 through stage 4 chronic kidney disease, or unspecified chronic kidney disease: Secondary | ICD-10-CM | POA: Diagnosis not present

## 2019-10-26 DIAGNOSIS — N183 Chronic kidney disease, stage 3 unspecified: Secondary | ICD-10-CM | POA: Diagnosis not present

## 2019-10-26 DIAGNOSIS — E1122 Type 2 diabetes mellitus with diabetic chronic kidney disease: Secondary | ICD-10-CM | POA: Diagnosis not present

## 2019-10-26 DIAGNOSIS — R109 Unspecified abdominal pain: Secondary | ICD-10-CM | POA: Diagnosis not present

## 2019-10-26 NOTE — Telephone Encounter (Signed)
Urine drug screen for this encounter is consistent for prescribed medication 

## 2019-10-27 ENCOUNTER — Other Ambulatory Visit: Payer: Self-pay | Admitting: Cardiovascular Disease

## 2019-10-27 ENCOUNTER — Other Ambulatory Visit: Payer: Medicare Other

## 2019-10-27 DIAGNOSIS — I10 Essential (primary) hypertension: Secondary | ICD-10-CM

## 2019-11-02 DIAGNOSIS — Z79899 Other long term (current) drug therapy: Secondary | ICD-10-CM | POA: Diagnosis not present

## 2019-11-02 DIAGNOSIS — N183 Chronic kidney disease, stage 3 unspecified: Secondary | ICD-10-CM | POA: Diagnosis not present

## 2019-11-02 DIAGNOSIS — M0579 Rheumatoid arthritis with rheumatoid factor of multiple sites without organ or systems involvement: Secondary | ICD-10-CM | POA: Diagnosis not present

## 2019-11-02 DIAGNOSIS — M1A00X0 Idiopathic chronic gout, unspecified site, without tophus (tophi): Secondary | ICD-10-CM | POA: Diagnosis not present

## 2019-11-02 DIAGNOSIS — M5442 Lumbago with sciatica, left side: Secondary | ICD-10-CM | POA: Diagnosis not present

## 2019-11-02 DIAGNOSIS — G8929 Other chronic pain: Secondary | ICD-10-CM | POA: Diagnosis not present

## 2019-11-02 DIAGNOSIS — R0789 Other chest pain: Secondary | ICD-10-CM | POA: Diagnosis not present

## 2019-11-02 DIAGNOSIS — J849 Interstitial pulmonary disease, unspecified: Secondary | ICD-10-CM | POA: Diagnosis not present

## 2019-11-09 ENCOUNTER — Other Ambulatory Visit: Payer: Self-pay

## 2019-11-09 ENCOUNTER — Other Ambulatory Visit: Payer: Medicare Other | Admitting: Nurse Practitioner

## 2019-11-09 ENCOUNTER — Telehealth: Payer: Self-pay | Admitting: Nurse Practitioner

## 2019-11-09 NOTE — Telephone Encounter (Signed)
error

## 2019-11-09 NOTE — Telephone Encounter (Signed)
I called [name] for scheduled telemedicine f/u PC visit, [name] requested to reschedule for 12/06/2019; completed with contact information.

## 2019-11-10 ENCOUNTER — Encounter (HOSPITAL_COMMUNITY): Payer: Self-pay

## 2019-11-10 ENCOUNTER — Other Ambulatory Visit: Payer: Self-pay

## 2019-11-10 ENCOUNTER — Emergency Department (HOSPITAL_COMMUNITY)
Admission: EM | Admit: 2019-11-10 | Discharge: 2019-11-11 | Disposition: A | Discharge status: Home / Self Care | Payer: Medicare Other | Attending: Emergency Medicine | Admitting: Emergency Medicine

## 2019-11-10 ENCOUNTER — Telehealth: Payer: Self-pay

## 2019-11-10 DIAGNOSIS — M25551 Pain in right hip: Secondary | ICD-10-CM

## 2019-11-10 DIAGNOSIS — M545 Low back pain, unspecified: Secondary | ICD-10-CM

## 2019-11-10 DIAGNOSIS — R319 Hematuria, unspecified: Secondary | ICD-10-CM | POA: Insufficient documentation

## 2019-11-10 DIAGNOSIS — Z5321 Procedure and treatment not carried out due to patient leaving prior to being seen by health care provider: Secondary | ICD-10-CM | POA: Diagnosis not present

## 2019-11-10 DIAGNOSIS — G8929 Other chronic pain: Secondary | ICD-10-CM

## 2019-11-10 DIAGNOSIS — M25552 Pain in left hip: Secondary | ICD-10-CM

## 2019-11-10 LAB — BASIC METABOLIC PANEL
Anion gap: 10 (ref 5–15)
BUN: 12 mg/dL (ref 8–23)
CO2: 31 mmol/L (ref 22–32)
Calcium: 8.6 mg/dL — ABNORMAL LOW (ref 8.9–10.3)
Chloride: 103 mmol/L (ref 98–111)
Creatinine, Ser: 1.22 mg/dL — ABNORMAL HIGH (ref 0.44–1.00)
GFR calc Af Amer: 49 mL/min — ABNORMAL LOW (ref >60)
GFR calc non Af Amer: 43 mL/min — ABNORMAL LOW (ref >60)
Glucose, Bld: 110 mg/dL — ABNORMAL HIGH (ref 70–99)
Potassium: 4.3 mmol/L (ref 3.5–5.1)
Sodium: 144 mmol/L (ref 135–145)

## 2019-11-10 LAB — CBC
HCT: 41.8 % (ref 36.0–46.0)
Hemoglobin: 12.4 g/dL (ref 12.0–15.0)
MCH: 30.8 pg (ref 26.0–34.0)
MCHC: 29.7 g/dL — ABNORMAL LOW (ref 30.0–36.0)
MCV: 104 fL — ABNORMAL HIGH (ref 80.0–100.0)
Platelets: 181 10*3/uL (ref 150–400)
RBC: 4.02 MIL/uL (ref 3.87–5.11)
RDW: 14.4 % (ref 11.5–15.5)
WBC: 6.7 10*3/uL (ref 4.0–10.5)
nRBC: 0 % (ref 0.0–0.2)

## 2019-11-10 LAB — URINALYSIS, ROUTINE W REFLEX MICROSCOPIC
Glucose, UA: NEGATIVE mg/dL
Hgb urine dipstick: NEGATIVE
Ketones, ur: NEGATIVE mg/dL
Nitrite: NEGATIVE
Protein, ur: 30 mg/dL — AB
Specific Gravity, Urine: 1.034 — ABNORMAL HIGH (ref 1.005–1.030)
WBC, UA: >50 WBC/hpf — ABNORMAL HIGH (ref 0–5)
pH: 5 (ref 5.0–8.0)

## 2019-11-10 NOTE — ED Triage Notes (Addendum)
Pt arrives to ED w/ c/o 10/10 lower back pain. Pt endorses hematuria. Pt endorses chills, unknown of whether she had a fever. Pt 99.0 F in triage.

## 2019-11-10 NOTE — Telephone Encounter (Signed)
Return [name] call we will order a Lumbar X-ray , she states her pain is in located in her lower back radiating into her buttocks, hips and lower extremities. Also reports increase intensity of bilateral hip pain. We will obtain X-rays, she verbalizes understanding.  [name] she states she notice small amount of blood in her stool today, also states this happened two weeks ago. She states she was seeing a Nephrologist and PCP was aware. She was instructed to call her PCP ASAP to make them aware of the above, she verbalizes understanding.

## 2019-11-10 NOTE — Telephone Encounter (Signed)
Spoke to pt and she stated she doesn't feel good and she is in a lot of pain. Pt also articulated that she had another episode of urinating blood clots. Spoke to [facility] and after she reviewed pt chart she advised that pt needed to go to the ED. Pt notified to go to the ED. Pt verbalized understanding.

## 2019-11-10 NOTE — Telephone Encounter (Signed)
[name] c/o back pain at a level 8. Pain increases with movement. She is using the Lidocaine patches.   Patient has been seem by  Rheumatology and had studies done at [facility]. She was advised to have a MRI of the back & spine.  [name] wanted to know if you could call her or order the imaging. Please advise.

## 2019-11-10 NOTE — Telephone Encounter (Signed)
The patient was told to contact our office for the blood in her stool. She said that she was urinating blood clots and now she has blood in her stool.She stated that she has sever back pain.   The kidney doctor put her on an antibiotic.  They did an ultrasound on her Kidney and everything is okay. The doctor thinks that is might have to do with rheumatoid arthitis causing her back pain.   Please advise

## 2019-11-11 ENCOUNTER — Other Ambulatory Visit: Payer: Self-pay

## 2019-11-11 ENCOUNTER — Telehealth: Payer: Self-pay | Admitting: Family Medicine

## 2019-11-11 ENCOUNTER — Ambulatory Visit
Admission: RE | Admit: 2019-11-11 | Discharge: 2019-11-11 | Disposition: A | Discharge status: Home / Self Care | Payer: Medicare Other | Source: Ambulatory Visit | Attending: Registered Nurse | Admitting: Registered Nurse

## 2019-11-11 NOTE — Telephone Encounter (Signed)
Patient is still in pain and blood in urine and possibly bowels.  Patient went to ER and after 6 hours of not being seen she left and went back home.  Her daughter is calling wanting to know what she can do?  [name]- her daughter- is wantsing a call back.

## 2019-11-11 NOTE — Telephone Encounter (Signed)
Spoke with pt daughter who is on pt DPR, state that she took pt to the ED yesterday and was in the waiting room for over 6 hrs, state that pt had UA/ labs done and pt left the ED before treatment, pt daughter state that pt is still having blood in urine and stool. Pt daughter is requesting for Dr Volanda Napoleon to review lab results from the ED and give advise on what pt needs to do next

## 2019-11-11 NOTE — ED Notes (Signed)
Called pts name 3x to be roomed with no response.

## 2019-11-14 ENCOUNTER — Other Ambulatory Visit: Payer: Self-pay

## 2019-11-14 ENCOUNTER — Other Ambulatory Visit (INDEPENDENT_AMBULATORY_CARE_PROVIDER_SITE_OTHER): Payer: Medicare Other

## 2019-11-14 DIAGNOSIS — N390 Urinary tract infection, site not specified: Secondary | ICD-10-CM

## 2019-11-14 NOTE — Telephone Encounter (Signed)
Pt should have stayed at the hospital if she is having blood in urine and stools.  Per UA from ED pt seems to have a UTI, however Ucx was not sent.  Would be beneficial to have a urine sample to send for Cx.  Pt should be scheduled today for f/u.

## 2019-11-14 NOTE — Telephone Encounter (Signed)
Spoke with pt daughter, aware that Dr Volanda Napoleon is advising pt to have a f/u appointment at the office, pt daughter is aware to bring pt by the office to give a urine sample for a Urine Culture. Pt daughter state that its ok to schedule pt with another Dr since Dr Volanda Napoleon is out of the office on Teusday's, Pt  scheduled for tomorrow at 1.30 pm with Dr Jerilee Hoh.

## 2019-11-15 ENCOUNTER — Ambulatory Visit: Payer: Medicare Other | Admitting: Family Medicine

## 2019-11-15 ENCOUNTER — Ambulatory Visit (INDEPENDENT_AMBULATORY_CARE_PROVIDER_SITE_OTHER): Payer: Medicare Other | Admitting: Internal Medicine

## 2019-11-15 ENCOUNTER — Encounter: Payer: Self-pay | Admitting: Internal Medicine

## 2019-11-15 VITALS — BP 130/80 | HR 90 | Temp 97.2°F | Wt 248.0 lb

## 2019-11-15 DIAGNOSIS — R319 Hematuria, unspecified: Secondary | ICD-10-CM | POA: Diagnosis not present

## 2019-11-15 DIAGNOSIS — G8929 Other chronic pain: Secondary | ICD-10-CM

## 2019-11-15 DIAGNOSIS — R262 Difficulty in walking, not elsewhere classified: Secondary | ICD-10-CM

## 2019-11-15 DIAGNOSIS — M545 Low back pain, unspecified: Secondary | ICD-10-CM

## 2019-11-15 DIAGNOSIS — M1711 Unilateral primary osteoarthritis, right knee: Secondary | ICD-10-CM | POA: Diagnosis not present

## 2019-11-15 DIAGNOSIS — M170 Bilateral primary osteoarthritis of knee: Secondary | ICD-10-CM

## 2019-11-15 DIAGNOSIS — Z09 Encounter for follow-up examination after completed treatment for conditions other than malignant neoplasm: Secondary | ICD-10-CM | POA: Diagnosis not present

## 2019-11-15 LAB — URINE CULTURE
MICRO NUMBER:: 10641984
SPECIMEN QUALITY:: ADEQUATE

## 2019-11-15 LAB — POCT URINALYSIS DIPSTICK
Bilirubin, UA: NEGATIVE
Blood, UA: NEGATIVE
Glucose, UA: NEGATIVE
Ketones, UA: NEGATIVE
Leukocytes, UA: NEGATIVE
Nitrite, UA: NEGATIVE
Protein, UA: NEGATIVE
Spec Grav, UA: 1.015 (ref 1.010–1.025)
Urobilinogen, UA: 0.2 E.U./dL
pH, UA: 6 (ref 5.0–8.0)

## 2019-11-15 MED ORDER — PANTOPRAZOLE SODIUM 40 MG PO TBEC: DELAYED_RELEASE_TABLET | ORAL | 1 refills | Status: DC

## 2019-11-15 NOTE — Patient Instructions (Signed)
-  Nice seeing you today!!  -Will arrange PT sessions for your back.  -Ice your back for 15 mins 3 times a day.

## 2019-11-15 NOTE — Progress Notes (Signed)
Established Patient Office Visit     This visit occurred during the SARS-CoV-2 public health emergency.  Safety protocols were in place, including screening questions prior to the visit, additional usage of staff PPE, and extensive cleaning of exam room while observing appropriate contact time as indicated for disinfecting solutions.    CC/Reason for Visit: Follow-up hematuria  HPI: [name] is a 79 y.o. female who is coming in today for the above mentioned reasons.  This is a patient of Dr. Volanda Napoleon who was seen in the hospital on 6/24 due to persistent, severe lower back pain and hematuria.  She has been describing subjective fevers and chills in addition to the pain.  She also has a sensation of pressure in her suprapubic area.  She does not have any dysuria or urinary frequency.  She states that earlier this month she was told she possibly had a UTI was given 4 days of an undisclosed antibiotic but then was called with the results of the culture which was negative and she discontinued antibiotics.  Unfortunately in the ED the wait times were very long and she left without evaluation.  She has had no further episodes of macroscopic hematuria.  The daughter brings her in for evaluation of this.  She had a renal ultrasound on June 7 without acute findings.  Her low back pain is actually chronic and has been present for years.  She sees a pain clinic and is prescribed MS Contin and MSIR for this.  Back pain got worse when she had hematuria.   Past Medical/Surgical History: Past Medical History:  Diagnosis Date  . Acute on chronic diastolic (congestive) heart failure ([place])   . Anemia    iron deficiency anemia - secondary to blood loss ( chronic)   . Arthritis    endstage changes bilateral knees/bilateral ankles.   . Asthma   . Carotid artery occlusion   . Chronic fatigue   . Chronic kidney disease   . Closed left hip fracture ([place])   . Clotting disorder ([place])    pt denies  this  . Contusion of left knee    due to fall 1/14.  [name] COPD (chronic obstructive pulmonary disease) (HCC)    pulmonary fibrosis  . Depression, reactive   . Diabetes mellitus    type II   . Diastolic dysfunction   . Difficulty in walking   . Family history of heart disease   . Generalized muscle weakness   . Gout   . High cholesterol   . History of falling   . Hypertension   . Hypothyroidism   . Interstitial lung disease ([place])   . 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    . Morbid obesity ([place])   . Morbid obesity ([place])   . Neuromuscular disorder ([place])    diabetic neuropathy   . Normal coronary arteries    cardiac catheterization performed  10/31/14  . RA (rheumatoid arthritis) ([place])    has been off methotreaxte since 10/13.  [name] Rheumatoid arthritis ([place])   . Shortness of breath   . Spinal stenosis of lumbar region   . Thyroid disease   . Unspecified lack of coordination   . URI (upper respiratory infection)     Past Surgical History:  Procedure Laterality Date  . ABDOMINAL HYSTERECTOMY    . BRAIN SURGERY     Gamma knife 10/13. Needs repeat  spring  '14  . CARDIAC CATHETERIZATION N/A 10/31/2014   Procedure: Right/Left Heart Cath and Coronary Angiography;  Surgeon: [name], MD;  Location: [facility];  Service: Cardiovascular;  Laterality: N/A;  . ESOPHAGOGASTRODUODENOSCOPY (EGD) WITH PROPOFOL N/A 09/14/2014   Procedure: ESOPHAGOGASTRODUODENOSCOPY (EGD) WITH PROPOFOL;  Surgeon: [name], MD;  Location: [facility];  Service: Endoscopy;  Laterality: N/A;  . INCISION AND DRAINAGE HIP Left 01/16/2017   Procedure: IRRIGATION AND DEBRIDEMENT LEFT HIP;  Surgeon: [name], MD;  Location: [facility];  Service: Orthopedics;  Laterality: Left;  . INTRAMEDULLARY (IM) NAIL INTERTROCHANTERIC Left 11/29/2016   Procedure: INTRAMEDULLARY (IM) NAIL INTERTROCHANTRIC;   Surgeon: [name], MD;  Location: [facility];  Service: Orthopedics;  Laterality: Left;  . OVARY SURGERY    . SHOULDER SURGERY Left   . TONSILLECTOMY  age 3  . VIDEO BRONCHOSCOPY Bilateral 05/31/2013   Procedure: VIDEO BRONCHOSCOPY WITHOUT FLUORO;  Surgeon: Brand Males, MD;  Location: [facility];  Service: Cardiopulmonary;  Laterality: Bilateral;  . video bronscoscopy  2000   lung    Social History:  reports that she has never smoked. She has never used smokeless tobacco. She reports that she does not drink alcohol and does not use drugs.  Allergies: Allergies  Allergen Reactions  . Codeine Swelling and Other (See Comments)    Facial swelling Chest pain and swelling in legs  Facial swelling  . Infliximab Anaphylaxis    "sent me into shock" "sent me into shock"  . Lisinopril Swelling    Face and neck swelling Face and neck swelling    Family History:  Family History  Problem Relation Age of Onset  . Diabetes Mother   . Heart attack Mother   . Hypertension Father   . Lung cancer Father   . Diabetes Sister   . Diabetes Brother   . Hypertension Brother   . Heart disease Brother   . Heart attack Brother   . Kidney cancer Brother   . Uterine cancer Daughter   . Breast cancer Sister   . Rheum arthritis Maternal Uncle   . Gout Brother   . Kidney failure Brother        x 5     Current Outpatient Medications:  .  acetaminophen (TYLENOL) 500 MG tablet, Take 1,000 mg by mouth every 6 (six) hours as needed for moderate pain. , Disp: , Rfl:  .  albuterol (PROVENTIL HFA;VENTOLIN HFA) 108 (90 BASE) MCG/ACT inhaler, Inhale 2 puffs into the lungs every 6 (six) hours as needed for wheezing or shortness of breath., Disp: , Rfl:  .  allopurinol (ZYLOPRIM) 300 MG tablet, Take 300 mg by mouth daily., Disp: , Rfl:  .  aspirin buffered (BUFFERIN) 325 MG TABS tablet, Take 325 mg by mouth daily., Disp: , Rfl:  .  atorvastatin (LIPITOR) 20 MG tablet, Take 10 mg by mouth  every evening. , Disp: , Rfl:  .  azithromycin (ZITHROMAX) 250 MG tablet, Take by mouth daily., Disp: , Rfl:  .  Calcium Carb-Cholecalciferol (CALCIUM + D3 PO), Take 2 tablets by mouth daily with lunch. , Disp: , Rfl:  .  cholecalciferol (VITAMIN D) 1000 UNITS tablet, Take 2,000 Units by mouth daily with lunch. , Disp: , Rfl:  .  colchicine 0.6 MG tablet, Take 0.6 mg by mouth daily., Disp: , Rfl:  .  cyclobenzaprine (FLEXERIL) 5 MG tablet, Take 1 tablet (5 mg total) by mouth at bedtime as needed for muscle spasms. (  Patient taking differently: Take 5 mg by mouth at bedtime. ), Disp: 90 tablet, Rfl: 3 .  diclofenac sodium (VOLTAREN) 1 % GEL, Apply 2 g topically 3 (three) times daily. Both hands and knees, Disp: 300 g, Rfl: 3 .  DULoxetine (CYMBALTA) 60 MG capsule, Take 1 capsule (60 mg total) by mouth daily., Disp: 90 capsule, Rfl: 3 .  fluticasone (FLONASE) 50 MCG/ACT nasal spray, Place 2 sprays into both nostrils daily., Disp: 16 g, Rfl: 3 .  furosemide (LASIX) 80 MG tablet, Take 80 mg by mouth., Disp: , Rfl:  .  gabapentin (NEURONTIN) 600 MG tablet, Take two in the morning , Two mid- afternoon and two at bedtime (Patient taking differently: Take 1,200 mg by mouth 3 (three) times daily. Take two in the morning , Two mid- afternoon and two at bedtime), Disp: 540 tablet, Rfl: 3 .  guaiFENesin (MUCINEX) 600 MG 12 hr tablet, Take 1,200 mg by mouth 2 (two) times daily., Disp: , Rfl:  .  hydroxychloroquine (PLAQUENIL) 200 MG tablet, Take 400 mg by mouth daily. , Disp: , Rfl:  .  ipratropium-albuterol (DUONEB) 0.5-2.5 (3) MG/3ML SOLN, Take 3 mLs by nebulization every 6 (six) hours as needed (cough/wheezing). , Disp: , Rfl:  .  leflunomide (ARAVA) 10 MG tablet, Take 10 mg by mouth daily., Disp: , Rfl:  .  levothyroxine (SYNTHROID) 50 MCG tablet, Take 1 tablet (50 mcg total) by mouth daily., Disp: 90 tablet, Rfl: 1 .  linaclotide (LINZESS) 72 MCG capsule, Take 1 capsule (72 mcg total) by mouth daily before  breakfast., Disp: 90 capsule, Rfl: 1 .  metoprolol succinate (TOPROL XL) 25 MG 24 hr tablet, Take 3 tablets (75 mg total) by mouth daily., Disp: 270 tablet, Rfl: 3 .  morphine (MS CONTIN) 15 MG 12 hr tablet, Take 1 tablet (15 mg total) by mouth every 12 (twelve) hours., Disp: 60 tablet, Rfl: 0 .  morphine (MSIR) 15 MG tablet, Take 1 tablet (15 mg total) by mouth 2 (two) times daily as needed for moderate pain ([facility] Pain)., Disp: 60 tablet, Rfl: 0 .  Multiple Vitamin (MULTIVITAMIN WITH MINERALS) TABS, Take 1 tablet by mouth daily. , Disp: , Rfl:  .  olmesartan (BENICAR) 40 MG tablet, Take 1 tablet (40 mg total) by mouth daily., Disp: 90 tablet, Rfl: 1 .  OXYGEN-HELIUM IN, Inhale 2 L into the lungs at bedtime. And on exertion, Disp: , Rfl:  .  pantoprazole (PROTONIX) 40 MG tablet, take 1 tablet by mouth once daily MINUTES BEFORE 1ST MEAL OF THE DAY, Disp: 90 tablet, Rfl: 1 .  potassium chloride SA (KLOR-CON) 20 MEQ tablet, Take 1 tablet (20 mEq total) by mouth daily., Disp: 90 tablet, Rfl: 3 .  predniSONE (DELTASONE) 5 MG tablet, Take 10 mg by mouth every morning. , Disp: , Rfl: 0 .  Semaglutide,0.25 or 0.5MG/DOS, (OZEMPIC, 0.25 OR 0.5 MG/DOSE,) 2 MG/1.5ML SOPN, Inject 0.5 mg into the skin once a week., Disp: 3 pen, Rfl: 1 .  sodium chloride (OCEAN) 0.65 % SOLN nasal spray, Place 1 spray into both nostrils as needed for congestion., Disp: , Rfl:  .  Spacer/Aero-Holding Chambers (AEROCHAMBER MV) inhaler, Use as instructed, Disp: 1 each, Rfl: 0  Review of Systems:  Constitutional: Denies  diaphoresis, appetite change and fatigue.  HEENT: Denies photophobia, eye pain, redness, hearing loss, ear pain, congestion, sore throat, rhinorrhea, sneezing, mouth sores, trouble swallowing, neck pain, neck stiffness and tinnitus.   Respiratory: Denies SOB, DOE, cough, chest tightness,  and wheezing.   Cardiovascular: Denies chest pain, palpitations and leg swelling.  Gastrointestinal: Denies nausea,  vomiting,diarrhea, constipation, blood in stool and abdominal distention.  Genitourinary: Denies dysuria, urgency, frequency and difficulty urinating.  Endocrine: Denies: hot or cold intolerance, sweats, changes in hair or nails, polyuria, polydipsia. Musculoskeletal: Denies myalgias, back pain, joint swelling, arthralgias and gait problem.  Skin: Denies pallor, rash and wound.  Neurological: Denies dizziness, seizures, syncope, weakness, light-headedness, numbness and headaches.  Hematological: Denies adenopathy. Easy bruising, personal or family bleeding history  Psychiatric/Behavioral: Denies suicidal ideation, mood changes, confusion, nervousness, sleep disturbance and agitation    Physical Exam: Vitals:   11/15/19 1339 11/15/19 1344  BP: (!) 160/90 130/80  Pulse: 90   Temp: (!) 97.2 F (36.2 C)   TempSrc: Temporal   SpO2: 95%   Weight: 248 lb (112.5 kg)     Body mass index is 42.57 kg/m.   Constitutional: NAD, calm, comfortable, morbidly obese, in a wheelchair Eyes: PERRL, lids and conjunctivae normal, wears corrective lenses ENMT: Mucous membranes are moist.  Abdomen: no tenderness, no masses palpated. No hepatosplenomegaly. Bowel sounds positive.  Psychiatric: Normal judgment and insight. Alert and oriented x 3. Normal mood.    Impression and Plan:  Hospital discharge follow-up Hematuria, unspecified type -Urine dipstick is clear. -Do not believe UTI is an issue, with negative renal ultrasound earlier this month I believe nephrolithiasis/obstruction is also unlikely. -She is on aspirin so it is possible she could have had transient hematuria related to this. -For her hematuria I have advised observation for now.  -For chronic back pain which is likely musculoskeletal in origin given lack of radiculopathy or any other concerning signs or symptoms, I will arrange for her to have physical therapy, have also advised icing.  She will contact her pain management doctor to  see if a temporary increase in her morphine dose is possible.    Patient Instructions  -Nice seeing you today!!  -Will arrange PT sessions for your back.  -Ice your back for 15 mins 3 times a day.       Lelon Frohlich, MD [facility]

## 2019-11-16 ENCOUNTER — Ambulatory Visit (INDEPENDENT_AMBULATORY_CARE_PROVIDER_SITE_OTHER): Payer: Medicare Other | Admitting: Cardiology

## 2019-11-16 ENCOUNTER — Other Ambulatory Visit: Payer: Self-pay

## 2019-11-16 ENCOUNTER — Telehealth: Payer: Self-pay | Admitting: Registered Nurse

## 2019-11-16 VITALS — BP 124/68 | HR 88 | Ht 64.0 in | Wt 253.2 lb

## 2019-11-16 DIAGNOSIS — E785 Hyperlipidemia, unspecified: Secondary | ICD-10-CM

## 2019-11-16 DIAGNOSIS — E1140 Type 2 diabetes mellitus with diabetic neuropathy, unspecified: Secondary | ICD-10-CM

## 2019-11-16 DIAGNOSIS — Z7901 Long term (current) use of anticoagulants: Secondary | ICD-10-CM

## 2019-11-16 DIAGNOSIS — G894 Chronic pain syndrome: Secondary | ICD-10-CM

## 2019-11-16 DIAGNOSIS — N183 Chronic kidney disease, stage 3 unspecified: Secondary | ICD-10-CM

## 2019-11-16 DIAGNOSIS — I10 Essential (primary) hypertension: Secondary | ICD-10-CM | POA: Diagnosis not present

## 2019-11-16 DIAGNOSIS — R600 Localized edema: Secondary | ICD-10-CM

## 2019-11-16 DIAGNOSIS — J849 Interstitial pulmonary disease, unspecified: Secondary | ICD-10-CM | POA: Diagnosis not present

## 2019-11-16 DIAGNOSIS — M05741 Rheumatoid arthritis with rheumatoid factor of right hand without organ or systems involvement: Secondary | ICD-10-CM

## 2019-11-16 DIAGNOSIS — M05742 Rheumatoid arthritis with rheumatoid factor of left hand without organ or systems involvement: Secondary | ICD-10-CM

## 2019-11-16 DIAGNOSIS — R55 Syncope and collapse: Secondary | ICD-10-CM

## 2019-11-16 DIAGNOSIS — R079 Chest pain, unspecified: Secondary | ICD-10-CM

## 2019-11-16 DIAGNOSIS — I824Y2 Acute embolism and thrombosis of unspecified deep veins of left proximal lower extremity: Secondary | ICD-10-CM

## 2019-11-16 MED ORDER — METOLAZONE 2.5 MG PO TABS
ORAL_TABLET | ORAL | 0 refills | Status: DC
Start: 2019-11-16 — End: 2019-12-14

## 2019-11-16 NOTE — Progress Notes (Signed)
Cardiology Office Note:    Date:  11/16/2019   ID:  NOREENE BOREMAN, DOB 11/12/1940, MRN [id number]  PCP:  [name], MD  Cardiologist:  Quay Burow, MD  Electrophysiologist:  None   Referring MD: [name], MD   No chief complaint on file.   History of Present Illness:    [name] F Willenbring is a 79 y.o. morbidly obese female with multiple medical problems, though no significant cardiac issues.  She had minor coronary disease in 2016 and has normal LV function by echo.  Other medical issues include chronic interstitial lung disease, CRI, chronic pain syndrome, and rheumatoid arthritis.  She was admitted in January 2020 after a syncopal spell leaving the pain clinic.  Ultimately it was felt this was secondary to dehydration and overmedication in conjunction with poor p.o. intake.  The patient was seen by cardiology during that admission.  After we had signed off her lower extremity venous Dopplers came back positive for left lower extremity DVT.  It was presumed she may have had a pulmonary embolism as well.  She did not have a CT scan because of chronic renal insufficiency.  It was determined that a VQ scan would not change her treatment and she was placed on Eliquis.  Echocardiogram during that admission showed normal LV function.  Her renal function is followed at [facility].  She still goes to [facility] for pulmonary issues and also sees [name].  She just saw dr Gwenlyn Found 10/13/2019. He noted that her diuretics had been increased by her nephrologist. He wanted her followed up in 4 weeks and she presents today for that appointment.  The patient brought a list of her home B/P readings- average 135/80.  She tells me her home weight is stable-245-250 lbs.  Her chronic LE edema is currently "good for me".  She uses O2 at night chronically.   Past Medical History:  Diagnosis Date  . Acute on chronic diastolic (congestive) heart failure ([place])   . Anemia    iron  deficiency anemia - secondary to blood loss ( chronic)   . Arthritis    endstage changes bilateral knees/bilateral ankles.   . Asthma   . Carotid artery occlusion   . Chronic fatigue   . Chronic kidney disease   . Closed left hip fracture ([place])   . Clotting disorder ([place])    pt denies this  . Contusion of left knee    due to fall 1/14.  [name] COPD (chronic obstructive pulmonary disease) (HCC)    pulmonary fibrosis  . Depression, reactive   . Diabetes mellitus    type II   . Diastolic dysfunction   . Difficulty in walking   . Family history of heart disease   . Generalized muscle weakness   . Gout   . High cholesterol   . History of falling   . Hypertension   . Hypothyroidism   . Interstitial lung disease ([place])   . 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    . Morbid obesity ([place])   . Morbid obesity ([place])   . Neuromuscular disorder ([place])    diabetic neuropathy   . Normal coronary arteries    cardiac catheterization performed  10/31/14  . RA (rheumatoid arthritis) ([place])    has been off methotreaxte since 10/13.  [name] Rheumatoid arthritis ([place])   . Shortness of breath   . Spinal stenosis  of lumbar region   . Thyroid disease   . Unspecified lack of coordination   . URI (upper respiratory infection)     Past Surgical History:  Procedure Laterality Date  . ABDOMINAL HYSTERECTOMY    . BRAIN SURGERY     Gamma knife 10/13. Needs repeat spring  '14  . CARDIAC CATHETERIZATION N/A 10/31/2014   Procedure: Right/Left Heart Cath and Coronary Angiography;  Surgeon: [name], MD;  Location: [facility];  Service: Cardiovascular;  Laterality: N/A;  . ESOPHAGOGASTRODUODENOSCOPY (EGD) WITH PROPOFOL N/A 09/14/2014   Procedure: ESOPHAGOGASTRODUODENOSCOPY (EGD) WITH PROPOFOL;  Surgeon: [name], MD;  Location: [facility];  Service: Endoscopy;  Laterality: N/A;  . INCISION AND  DRAINAGE HIP Left 01/16/2017   Procedure: IRRIGATION AND DEBRIDEMENT LEFT HIP;  Surgeon: [name], MD;  Location: [facility];  Service: Orthopedics;  Laterality: Left;  . INTRAMEDULLARY (IM) NAIL INTERTROCHANTERIC Left 11/29/2016   Procedure: INTRAMEDULLARY (IM) NAIL INTERTROCHANTRIC;  Surgeon: [name], MD;  Location: [facility];  Service: Orthopedics;  Laterality: Left;  . OVARY SURGERY    . SHOULDER SURGERY Left   . TONSILLECTOMY  age 66  . VIDEO BRONCHOSCOPY Bilateral 05/31/2013   Procedure: VIDEO BRONCHOSCOPY WITHOUT FLUORO;  Surgeon: Brand Males, MD;  Location: [facility];  Service: Cardiopulmonary;  Laterality: Bilateral;  . video bronscoscopy  2000   lung    Current Medications: Current Meds  Medication Sig  . acetaminophen (TYLENOL) 500 MG tablet Take 1,000 mg by mouth every 6 (six) hours as needed for moderate pain.   [name] albuterol (PROVENTIL HFA;VENTOLIN HFA) 108 (90 BASE) MCG/ACT inhaler Inhale 2 puffs into the lungs every 6 (six) hours as needed for wheezing or shortness of breath.  . allopurinol (ZYLOPRIM) 300 MG tablet Take 300 mg by mouth daily.  [name] aspirin buffered (BUFFERIN) 325 MG TABS tablet Take 325 mg by mouth daily.  [name] atorvastatin (LIPITOR) 20 MG tablet Take 10 mg by mouth every evening.   [name] azithromycin (ZITHROMAX) 250 MG tablet Take by mouth daily.  . Calcium Carb-Cholecalciferol (CALCIUM + D3 PO) Take 2 tablets by mouth daily with lunch.   . cholecalciferol (VITAMIN D) 1000 UNITS tablet Take 2,000 Units by mouth daily with lunch.   . colchicine 0.6 MG tablet Take 0.6 mg by mouth daily.  . cyclobenzaprine (FLEXERIL) 5 MG tablet Take 1 tablet (5 mg total) by mouth at bedtime as needed for muscle spasms. (Patient taking differently: Take 5 mg by mouth at bedtime. )  . diclofenac sodium (VOLTAREN) 1 % GEL Apply 2 g topically 3 (three) times daily. Both hands and knees  . DULoxetine (CYMBALTA) 60 MG capsule Take 1 capsule (60 mg total) by mouth  daily.  . fluticasone (FLONASE) 50 MCG/ACT nasal spray Place 2 sprays into both nostrils daily.  . furosemide (LASIX) 80 MG tablet Take 80 mg by mouth.  . gabapentin (NEURONTIN) 600 MG tablet Take two in the morning , Two mid- afternoon and two at bedtime (Patient taking differently: Take 1,200 mg by mouth 3 (three) times daily. Take two in the morning , Two mid- afternoon and two at bedtime)  . guaiFENesin (MUCINEX) 600 MG 12 hr tablet Take 1,200 mg by mouth 2 (two) times daily.  . hydroxychloroquine (PLAQUENIL) 200 MG tablet Take 400 mg by mouth daily.   [name] ipratropium-albuterol (DUONEB) 0.5-2.5 (3) MG/3ML SOLN Take 3 mLs by nebulization every 6 (six) hours as needed (cough/wheezing).   [name] leflunomide (ARAVA) 10 MG  tablet Take 10 mg by mouth daily.  [name] levothyroxine (SYNTHROID) 50 MCG tablet Take 1 tablet (50 mcg total) by mouth daily.  [name] linaclotide (LINZESS) 72 MCG capsule Take 1 capsule (72 mcg total) by mouth daily before breakfast.  . metoprolol succinate (TOPROL XL) 25 MG 24 hr tablet Take 3 tablets (75 mg total) by mouth daily.  [name] morphine (MS CONTIN) 15 MG 12 hr tablet Take 1 tablet (15 mg total) by mouth every 12 (twelve) hours.  [name] morphine (MSIR) 15 MG tablet Take 1 tablet (15 mg total) by mouth 2 (two) times daily as needed for moderate pain ([facility] Pain).  . Multiple Vitamin (MULTIVITAMIN WITH MINERALS) TABS Take 1 tablet by mouth daily.   [name] olmesartan (BENICAR) 40 MG tablet Take 1 tablet (40 mg total) by mouth daily.  . OXYGEN-HELIUM IN Inhale 2 L into the lungs at bedtime. And on exertion  . pantoprazole (PROTONIX) 40 MG tablet take 1 tablet by mouth once daily MINUTES BEFORE 1ST MEAL OF THE DAY  . potassium chloride SA (KLOR-CON) 20 MEQ tablet Take 1 tablet (20 mEq total) by mouth daily.  . predniSONE (DELTASONE) 5 MG tablet Take 10 mg by mouth every morning.   . Semaglutide,0.25 or 0.5MG/DOS, (OZEMPIC, 0.25 OR 0.5 MG/DOSE,) 2 MG/1.5ML SOPN Inject 0.5 mg into the skin once a  week.  . sodium chloride (OCEAN) 0.65 % SOLN nasal spray Place 1 spray into both nostrils as needed for congestion.  [name] Spacer/Aero-Holding Chambers (AEROCHAMBER MV) inhaler Use as instructed     Allergies:   Codeine, Infliximab, and Lisinopril   Social History   Socioeconomic History  . Marital status: Married    Spouse name: Not on file  . Number of children: 6  . Years of education: college  . Highest education level: Not on file  Occupational History  . Occupation: retired Marine scientist  Tobacco Use  . Smoking status: Never Smoker  . Smokeless tobacco: Never Used  Vaping Use  . Vaping Use: Never used  Substance and Sexual Activity  . Alcohol use: No  . Drug use: No  . Sexual activity: Not Currently  Other Topics Concern  . Not on file  Social History Narrative   Patient consumes 2-3 cups coffee per day.   Social Determinants of Health   Financial Resource Strain:   . Difficulty of Paying Living Expenses:   Food Insecurity:   . Worried About Charity fundraiser in the Last Year:   . Arboriculturist in the Last Year:   Transportation Needs:   . Film/video editor (Medical):   [name] Lack of Transportation (Non-Medical):   Physical Activity:   . Days of Exercise per Week:   . Minutes of Exercise per Session:   Stress:   . Feeling of Stress :   Social Connections:   . Frequency of Communication with Friends and Family:   . Frequency of Social Gatherings with Friends and Family:   . Attends Religious Services:   . Active Member of Clubs or Organizations:   . Attends Archivist Meetings:   [name] Marital Status:      Family History: The patient's family history includes Breast cancer in her sister; Diabetes in her brother, mother, and sister; Gout in her brother; Heart attack in her brother and mother; Heart disease in her brother; Hypertension in her brother and father; Kidney cancer in her brother; Kidney failure in her brother; Lung cancer in her father; Rheum  arthritis in her maternal uncle; Uterine cancer in her daughter.  ROS:   Please see the history of present illness.     All other systems reviewed and are negative.  EKGs/Labs/Other Studies Reviewed:    The following studies were reviewed today:  Echo 06/04/2018- LV EF: 60% -  65%   -------------------------------------------------------------------  Study Conclusions   - Left ventricle: The cavity size was normal. There was mild focal  basal hypertrophy of the septum. Systolic function was normal.  The estimated ejection fraction was in the range of 60% to 65%.  Wall motion was normal; there were no regional wall motion  abnormalities.  - Right ventricle: The cavity size was mildly dilated. Wall  thickness was normal.  - Pulmonary arteries: Systolic pressure was mildly increased.  EKG:  EKG is not ordered today.  The ekg ordered 10/13/2018 today demonstrates NSR, LAD  Recent Labs: 04/21/2019: ALT 36 04/27/2019: B Natriuretic Peptide 25.9 04/28/2019: Magnesium 1.8 09/22/2019: TSH 1.84 11/10/2019: BUN 12; Creatinine, Ser 1.22; Hemoglobin 12.4; Platelets 181; Potassium 4.3; Sodium 144  Recent Lipid Panel    Component Value Date/Time   CHOL 161 09/22/2019 1144   CHOL 150 05/26/2018 1259   TRIG 194.0 (H) 09/22/2019 1144   HDL 47.20 09/22/2019 1144   HDL 65 05/26/2018 1259   CHOLHDL 3 09/22/2019 1144   VLDL 38.8 09/22/2019 1144   LDLCALC 75 09/22/2019 1144   LDLCALC 63 05/26/2018 1259    Physical Exam:    VS:  BP 124/68   Pulse 88   Ht _0  (1.626 m)   Wt 253 lb 3.2 oz (114.9 kg)   SpO2 96%   BMI 43.46 kg/m     Wt Readings from Last 3 Encounters:  11/16/19 253 lb 3.2 oz (114.9 kg)  11/15/19 248 lb (112.5 kg)  11/10/19 246 lb (111.6 kg)     GEN: Morbidly obese AA female presents to the office in a wheel chair in no acute distress HEENT: Normal NECK: No JVD; No carotid bruits CARDIAC: RRR, no murmurs, rubs, gallops RESPIRATORY:  Clear to auscultation  without rales, wheezing or rhonchi  ABDOMEN: obese MUSCULOSKELETAL:  Chronic LE edema Lt > Rt ; No deformity  SKIN: Warm and dry NEUROLOGIC:  Alert and oriented x 3 PSYCHIATRIC:  Normal affect   ASSESSMENT:    Chronic LE edema- I suspect this is multifactorial- some degree of Rt heart failure secondary to ILD, edema from her DVT, and edema from obstructed venous return from truncal obesity.  She tells me currently she is at baseline.  HTN- Adequate control.   H/O syncope and collapse 06/03/2018- normal LVF on echo.  Presumed to be secondary to hypotension from medications.  DVT (deep venous thrombosis) (HCC) New LLE DVT Jan 2020- possible PE but CTA not done ([facility]) and VQ would most likley not change Rx.  Normal coronary arteries Normal coronary arteries (20%) cath 2016  Anticoagulated Eliquis added Jan 2020   Chronic pain Followed at pain clinic  Morbid obesity ([place]) BMI 43  ILD (interstitial lung disease) ([place]) Prior evaluation at [facility] (chronic renal insufficiency), stage 3A  Followed by [facility]  PLAN:    I did not change her medications except to add metolazone 2.5 mg PRN for weight greater than 255 lbs.  She will keep her f/u with Dr Gwenlyn Found in August as scheduled.    Medication Adjustments/Labs and Tests Ordered: Current medicines are reviewed at length with the patient today.  Concerns regarding medicines  are outlined above.  No orders of the defined types were placed in this encounter.  No orders of the defined types were placed in this encounter.   There are no Patient Instructions on file for this visit.   Signed, Kerin Ransom, PA-C  11/16/2019 2:55 PM    [facility]

## 2019-11-16 NOTE — Telephone Encounter (Signed)
Discussed with Dr Naaman Plummer , Ms. [name] Lumbar X-ray. Placed a call to Ms. Killion to discuss X-rays, her husband states she was at a doctor's appointment. I asked Mr. Binning to have Ms. Ringwald to return the call, he verbalizes understanding.

## 2019-11-16 NOTE — Patient Instructions (Signed)
Medication Instructions:   START Metolazone 2.5 mg---take 1 tablet daily as needed for 2 days for weight greater than 255 lbs.  *If you need a refill on your cardiac medications before your next appointment, please call your pharmacy*   Follow-Up: At [facility], you and your health needs are our priority.  As part of our continuing mission to provide you with exceptional heart care, we have created designated Provider Care Teams.  These Care Teams include your primary Cardiologist (physician) and Advanced Practice Providers (APPs -  Physician Assistants and Nurse Practitioners) who all work together to provide you with the care you need, when you need it.  We recommend signing up for the patient portal called "MyChart".  Sign up information is provided on this After Visit Summary.  MyChart is used to connect with patients for Virtual Visits (Telemedicine).  Patients are able to view lab/test results, encounter notes, upcoming appointments, etc.  Non-urgent messages can be sent to your provider as well.   To learn more about what you can do with MyChart, go to [email or web address].    Your next appointment:   Wednesday, 01/11/20 at 3:00 PM  The format for your next appointment:   In Person  Provider:   Quay Burow, MD   Other Instructions Please call our office if the metolazone is not working.

## 2019-11-17 ENCOUNTER — Telehealth: Payer: Self-pay | Admitting: Registered Nurse

## 2019-11-17 ENCOUNTER — Telehealth: Payer: Self-pay

## 2019-11-17 NOTE — Telephone Encounter (Signed)
Mrs. Boran said you are trying to reach her. She can reached on phone number  [phone].

## 2019-11-17 NOTE — Telephone Encounter (Signed)
Placed another call to Ms. Villagomez, regarding her X-rays. Her husband states she wasn't home and will relay the message to return the call.

## 2019-11-18 NOTE — Telephone Encounter (Signed)
Return [name] call , reviewed her Hip and Lumbar X-ray with her. Also this provider discussed [name] X-ray with Dr Naaman Plummer, at this time she will be encouraged to performed HEP as tolerated, she verbalizes understanding. Also states she is experiencing increase intensity of lower back pain  And not receiving any relief of her pain. She also reports MSIR gives her 3- 4 hours of pain relief. She was instructed on the following, she may increase her  MSIR to one tablet every 8 hours as needed for pain and she will call office on Tuesday with an update, she verbalizes understanding.

## 2019-11-22 ENCOUNTER — Encounter: Payer: Medicare Other | Admitting: Registered Nurse

## 2019-11-23 ENCOUNTER — Ambulatory Visit: Payer: Medicare Other | Admitting: Podiatry

## 2019-11-28 ENCOUNTER — Other Ambulatory Visit: Payer: Self-pay

## 2019-11-28 ENCOUNTER — Encounter (HOSPITAL_COMMUNITY): Payer: Self-pay

## 2019-11-28 ENCOUNTER — Ambulatory Visit (HOSPITAL_COMMUNITY)
Admission: EM | Admit: 2019-11-28 | Discharge: 2019-11-28 | Disposition: A | Discharge status: Home / Self Care | Payer: Medicare Other | Attending: Family Medicine | Admitting: Family Medicine

## 2019-11-28 DIAGNOSIS — R109 Unspecified abdominal pain: Secondary | ICD-10-CM

## 2019-11-28 DIAGNOSIS — M79605 Pain in left leg: Secondary | ICD-10-CM | POA: Insufficient documentation

## 2019-11-28 DIAGNOSIS — M79606 Pain in leg, unspecified: Secondary | ICD-10-CM

## 2019-11-28 DIAGNOSIS — M25559 Pain in unspecified hip: Secondary | ICD-10-CM

## 2019-11-28 LAB — POCT URINALYSIS DIP (DEVICE)
Bilirubin Urine: NEGATIVE
Glucose, UA: NEGATIVE mg/dL
Hgb urine dipstick: NEGATIVE
Ketones, ur: NEGATIVE mg/dL
Nitrite: NEGATIVE
Protein, ur: NEGATIVE mg/dL
Specific Gravity, Urine: 1.025 (ref 1.005–1.030)
Urobilinogen, UA: 0.2 mg/dL (ref 0.0–1.0)
pH: 5.5 (ref 5.0–8.0)

## 2019-11-28 MED ORDER — CEPHALEXIN 500 MG PO CAPS
500.0000 mg | ORAL_CAPSULE | Freq: Two times a day (BID) | ORAL | 0 refills | Status: AC
Start: 2019-11-28 — End: 2019-12-05

## 2019-11-28 NOTE — Discharge Instructions (Addendum)
We will go ahead and treat for urinary tract infection As far as pain medicine goes there is nothing else I can give you for pain.  I am sending your urine for culture.  You may need to contact your primary care doctor or pain management doctor.  If the pain worsens you may need to go to the ER.  You can also try some Voltaren gel or salon pas.

## 2019-11-28 NOTE — ED Triage Notes (Signed)
Pt presents with pain in left hip that radiates down left leg X 6 weeks ; pt states she had surgery for broken hip in 2018 and had a rod put in.  Pt takes 2 narcotics for pain daily but she states she gets no relief at all.

## 2019-11-28 NOTE — Telephone Encounter (Signed)
Patient's son called to speak with Dr. Volanda Napoleon or her nurse because the patient is in severe pain.  I sent the patient to our nurse triage  Waiting Triage nurse

## 2019-11-29 ENCOUNTER — Telehealth: Payer: Self-pay

## 2019-11-29 ENCOUNTER — Telehealth (HOSPITAL_COMMUNITY): Payer: Self-pay | Admitting: Emergency Medicine

## 2019-11-29 DIAGNOSIS — M7062 Trochanteric bursitis, left hip: Secondary | ICD-10-CM

## 2019-11-29 DIAGNOSIS — M47816 Spondylosis without myelopathy or radiculopathy, lumbar region: Secondary | ICD-10-CM

## 2019-11-29 DIAGNOSIS — M1711 Unilateral primary osteoarthritis, right knee: Secondary | ICD-10-CM

## 2019-11-29 DIAGNOSIS — M1712 Unilateral primary osteoarthritis, left knee: Secondary | ICD-10-CM

## 2019-11-29 LAB — URINE CULTURE: Culture: <10000 — AB

## 2019-11-29 MED ORDER — MORPHINE SULFATE 15 MG PO TABS: 15.0000 mg | ORAL_TABLET | Freq: Three times a day (TID) | ORAL | 0 refills | Status: DC | PRN

## 2019-11-29 NOTE — Telephone Encounter (Signed)
Please advise 

## 2019-11-29 NOTE — Telephone Encounter (Signed)
Return to Ms. Jackowski call, she was instructed to ask her daughter to assist her with her medication. Ms. Gienger reports increase intensity of pain but according to the PMP, Her MS Contin was filled on 10/19/2019 and her MSIR was filled on 10/19/2019. It looks like Ms. Southern is not taking her medication as prescribed. She was asked to have her daughter [name] to assist her with her medication, Ms. Starzyk was instructed to purchase a pill container and to have her daughter to set up her pill container, she verbalizes understanding. We will follow up with the above.

## 2019-11-29 NOTE — Telephone Encounter (Signed)
Patient called stating that she needs refill on Morphine IR, states she spoke to [place] last week and due to pain she can take one extra tablet a day.

## 2019-11-29 NOTE — Telephone Encounter (Signed)
Urine culture was negative; pt called and given results and told to discontinue antibiotics; verbalized understanding

## 2019-11-29 NOTE — ED Provider Notes (Signed)
[place]    CSN: [id number] Arrival date & time: 11/28/19  1209      History   Chief Complaint Chief Complaint  Patient presents with  . Hip Pain  . Leg Pain    HPI [name] is a 79 y.o. female.   Pt is a 79 year old female that presents with back pain, left leg and hip pain. This has been constant for the past 6 weeks. Feels like it is worsening. She has known spinal stenosis and hip surgery. She has chronic pain and takes morphine for pain. She is also on gabapentin, prednisone, and flexeril at bedtime. None of this is helping her pain. Left flank sensitive to touch. Has had bleeding in the urine off and on for weeks. No numbness, tingling or loss of bowel or bladder function. No recent falls or injuries. She has had repeat xray of the lumbar spine and left hip with no acute changes. Negative urine cultures. She has seen her nephrologist and rheumatologist.  She is supposed to start physical therapy of this.  She is also making lidocaine patches, ice and heat without any relief.  Use the walker at home.  ROS per HPI      Past Medical History:  Diagnosis Date  . Acute on chronic diastolic (congestive) heart failure ([place])   . Anemia    iron deficiency anemia - secondary to blood loss ( chronic)   . Arthritis    endstage changes bilateral knees/bilateral ankles.   . Asthma   . Carotid artery occlusion   . Chronic fatigue   . Chronic kidney disease   . Closed left hip fracture ([place])   . Clotting disorder ([place])    pt denies this  . Contusion of left knee    due to fall 1/14.  [name] COPD (chronic obstructive pulmonary disease) (HCC)    pulmonary fibrosis  . Depression, reactive   . Diabetes mellitus    type II   . Diastolic dysfunction   . Difficulty in walking   . Family history of heart disease   . Generalized muscle weakness   . Gout   . High cholesterol   . History of falling   . Hypertension   . Hypothyroidism   . Interstitial lung disease  ([place])   . 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    . Morbid obesity ([place])   . Morbid obesity ([place])   . Neuromuscular disorder ([place])    diabetic neuropathy   . Normal coronary arteries    cardiac catheterization performed  10/31/14  . RA (rheumatoid arthritis) ([place])    has been off methotreaxte since 10/13.  [name] Rheumatoid arthritis ([place])   . Shortness of breath   . Spinal stenosis of lumbar region   . Thyroid disease   . Unspecified lack of coordination   . URI (upper respiratory infection)     Patient Active Problem List   Diagnosis Date Noted  . Diabetic neuropathy ([place]) 05/24/2019  . Sepsis secondary to UTI ([place]) 04/25/2019  . Cellulitis in diabetic foot ([place]) 04/25/2019  . Hypokalemia 04/21/2019  . Anticoagulated 06/25/2018  . Chronic pain 06/25/2018  . CRI (chronic renal insufficiency), stage 3 (moderate) 06/25/2018  . Drug-induced constipation 06/18/2018  . DVT (deep venous thrombosis) ([place]) 06/03/2018  . Syncope and collapse 06/02/2018  . Chest pain 06/02/2018  . Primary osteoarthritis of both knees 05/26/2018  .  Body mass index (BMI) of 50-59.9 in adult ([place]) 05/26/2018  . Acquired hypothyroidism 05/26/2018  . Chronic bilateral low back pain without sciatica 02/17/2018  . Astigmatism with presbyopia, bilateral 03/26/2017  . Cortical age-related cataract of both eyes 03/26/2017  . Family history of glaucoma 03/26/2017  . Long term current use of oral hypoglycemic drug 03/26/2017  . Nuclear sclerotic cataract of both eyes 03/26/2017  . Pain in left hip 02/18/2017  . Type 2 diabetes mellitus ([place]) 01/16/2017  . Closed nondisplaced intertrochanteric fracture of left femur with delayed healing 01/14/2017  . Postoperative wound infection 01/14/2017  . Gout 11/29/2016  . Depression 11/29/2016  . Closed left hip fracture ([place]) 11/29/2016  . GERD (gastroesophageal reflux disease)  11/29/2016  . Closed intertrochanteric fracture of hip, left, initial encounter ([place]) 11/29/2016  . Physical deconditioning 07/31/2015  . Pulmonary fibrosis, postinflammatory ([place]) 07/31/2015  . Bronchiectasis without complication ([place]) 85/63/1497  . Tendinitis of left rotator cuff 06/13/2015  . Acute on chronic diastolic heart failure ([place]) 04/21/2015  . Acute kidney injury superimposed on chronic kidney disease ([place]) 04/17/2015  . Hypotension 04/17/2015  . Chronic respiratory failure with hypoxia ([place]) 02/09/2015  . Dyspnea and respiratory abnormality 01/03/2015  . Normal coronary arteries 11/02/2014  . Morbid obesity ([place]) 10/30/2014  . Dysphagia 08/21/2014  . Chronic fatigue 06/21/2014  . DOE (dyspnea on exertion) 05/24/2014  . Primary osteoarthritis of left knee 04/26/2014  . High risk medication use 04/17/2014  . Aphasia 02/12/2014  . 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 ([place]) 01/18/2014  . Lumbar facet arthropathy 01/18/2014  . Bilateral edema of lower extremity 05/30/2013  . Chronic cough 05/24/2013  . ILD (interstitial lung disease) ([place]) 04/10/2013  . Greater trochanteric bursitis of left hip 08/13/2012  . Spinal stenosis of lumbar region with radiculopathy 07/12/2012  . Inability to walk 07/12/2012  . Asymptomatic bacteriuria 07/12/2012  . Meningioma of left sphenoid wing involving cavernous sinus ([place]) 02/17/2012  . Chest pain of uncertain etiology 02/63/7858  . Rheumatoid arthritis ([place]) 01/28/2012  . Primary osteoarthritis of right knee 01/28/2012  . Dyslipidemia   . Thyroid disease   . Essential hypertension     Past Surgical History:  Procedure Laterality Date  . ABDOMINAL HYSTERECTOMY    . BRAIN SURGERY     Gamma knife 10/13. Needs repeat spring  '14  . CARDIAC CATHETERIZATION N/A 10/31/2014   Procedure: Right/Left Heart Cath and Coronary Angiography;  Surgeon: [name], MD;  Location: [facility];  Service: Cardiovascular;   Laterality: N/A;  . ESOPHAGOGASTRODUODENOSCOPY (EGD) WITH PROPOFOL N/A 09/14/2014   Procedure: ESOPHAGOGASTRODUODENOSCOPY (EGD) WITH PROPOFOL;  Surgeon: [name], MD;  Location: [facility];  Service: Endoscopy;  Laterality: N/A;  . INCISION AND DRAINAGE HIP Left 01/16/2017   Procedure: IRRIGATION AND DEBRIDEMENT LEFT HIP;  Surgeon: [name], MD;  Location: [facility];  Service: Orthopedics;  Laterality: Left;  . INTRAMEDULLARY (IM) NAIL INTERTROCHANTERIC Left 11/29/2016   Procedure: INTRAMEDULLARY (IM) NAIL INTERTROCHANTRIC;  Surgeon: [name], MD;  Location: [facility];  Service: Orthopedics;  Laterality: Left;  . OVARY SURGERY    . SHOULDER SURGERY Left   . TONSILLECTOMY  age 77  . VIDEO BRONCHOSCOPY Bilateral 05/31/2013   Procedure: VIDEO BRONCHOSCOPY WITHOUT FLUORO;  Surgeon: Brand Males, MD;  Location: [facility];  Service: Cardiopulmonary;  Laterality: Bilateral;  . video bronscoscopy  2000   lung    OB History   No obstetric history on file.  Home Medications    Prior to Admission medications   Medication Sig Start Date End Date Taking? Authorizing Provider  acetaminophen (TYLENOL) 500 MG tablet Take 1,000 mg by mouth every 6 (six) hours as needed for moderate pain.     [provider]  albuterol (PROVENTIL HFA;VENTOLIN HFA) 108 (90 BASE) MCG/ACT inhaler Inhale 2 puffs into the lungs every 6 (six) hours as needed for wheezing or shortness of breath.    [provider]  allopurinol (ZYLOPRIM) 300 MG tablet Take 300 mg by mouth daily.    [provider]  aspirin buffered (BUFFERIN) 325 MG TABS tablet Take 325 mg by mouth daily.    [provider]  atorvastatin (LIPITOR) 20 MG tablet Take 10 mg by mouth every evening.     [provider]  azithromycin (ZITHROMAX) 250 MG tablet Take by mouth daily.    [provider]  Calcium Carb-Cholecalciferol (CALCIUM + D3 PO) Take 2 tablets by mouth  daily with lunch.     [provider]  cephALEXin (KEFLEX) 500 MG capsule Take 1 capsule (500 mg total) by mouth 2 (two) times daily for 7 days. 11/28/19 12/05/19  Loura Halt A, NP  cholecalciferol (VITAMIN D) 1000 UNITS tablet Take 2,000 Units by mouth daily with lunch.     [provider]  colchicine 0.6 MG tablet Take 0.6 mg by mouth daily. 06/25/16   [provider]  cyclobenzaprine (FLEXERIL) 5 MG tablet Take 1 tablet (5 mg total) by mouth at bedtime as needed for muscle spasms. Patient taking differently: Take 5 mg by mouth at bedtime.  08/26/17   [name], NP  diclofenac sodium (VOLTAREN) 1 % GEL Apply 2 g topically 3 (three) times daily. Both hands and knees 02/08/19   [name], NP  DULoxetine (CYMBALTA) 60 MG capsule Take 1 capsule (60 mg total) by mouth daily. 02/08/19   [name], NP  fluticasone (FLONASE) 50 MCG/ACT nasal spray Place 2 sprays into both nostrils daily. 09/22/19   [name], MD  furosemide (LASIX) 80 MG tablet Take 80 mg by mouth.    [provider]  gabapentin (NEURONTIN) 600 MG tablet Take two in the morning , Two mid- afternoon and two at bedtime Patient taking differently: Take 1,200 mg by mouth 3 (three) times daily. Take two in the morning , Two mid- afternoon and two at bedtime 02/08/19   [name], NP  guaiFENesin (MUCINEX) 600 MG 12 hr tablet Take 1,200 mg by mouth 2 (two) times daily.    [provider]  hydroxychloroquine (PLAQUENIL) 200 MG tablet Take 400 mg by mouth daily.     [provider]  ipratropium-albuterol (DUONEB) 0.5-2.5 (3) MG/3ML SOLN Take 3 mLs by nebulization every 6 (six) hours as needed (cough/wheezing).     [provider]  leflunomide (ARAVA) 10 MG tablet Take 10 mg by mouth daily.    [provider]  levothyroxine (SYNTHROID) 50 MCG tablet Take 1 tablet (50 mcg total) by mouth daily. 09/22/19   [name], MD  linaclotide Rolan Lipa) 72 MCG  capsule Take 1 capsule (72 mcg total) by mouth daily before breakfast. 09/22/19   [name], MD  metolazone (ZAROXOLYN) 2.5 MG tablet Take 1 tablet by mouth daily as needed for 2 days for weight above 255 lbs. 11/16/19   Erlene Quan, PA-C  metoprolol succinate (TOPROL XL) 25 MG 24 hr tablet Take 3 tablets (75 mg total) by  mouth daily. 09/22/19   [name], MD  morphine (MS CONTIN) 15 MG 12 hr tablet Take 1 tablet (15 mg total) by mouth every 12 (twelve) hours. 10/19/19   Meredith Staggers, MD  morphine (MSIR) 15 MG tablet Take 1 tablet (15 mg total) by mouth 2 (two) times daily as needed for moderate pain ([facility]). 10/19/19   Meredith Staggers, MD  Multiple Vitamin (MULTIVITAMIN WITH MINERALS) TABS Take 1 tablet by mouth daily.     [provider]  olmesartan (BENICAR) 40 MG tablet Take 1 tablet (40 mg total) by mouth daily. 09/22/19   [name], MD  OXYGEN-HELIUM IN Inhale 2 L into the lungs at bedtime. And on exertion    [provider]  pantoprazole (PROTONIX) 40 MG tablet take 1 tablet by mouth once daily MINUTES BEFORE 1ST MEAL OF THE DAY 11/15/19   Isaac Bliss, Rayford Halsted, MD  potassium chloride SA (KLOR-CON) 20 MEQ tablet Take 1 tablet (20 mEq total) by mouth daily. 05/12/19   [name], MD  predniSONE (DELTASONE) 5 MG tablet Take 10 mg by mouth every morning.  06/20/15   [provider]  Semaglutide,0.25 or 0.5MG/DOS, (OZEMPIC, 0.25 OR 0.5 MG/DOSE,) 2 MG/1.5ML SOPN Inject 0.5 mg into the skin once a week. 07/20/19   [name], MD  sodium chloride (OCEAN) 0.65 % SOLN nasal spray Place 1 spray into both nostrils as needed for congestion.    [provider]  Spacer/Aero-Holding Chambers (AEROCHAMBER MV) inhaler Use as instructed 06/10/13   Parrett, Fonnie Mu, NP    Family History Family History  Problem Relation Age of Onset  . Diabetes Mother   . Heart attack Mother   . Hypertension Father   . Lung cancer Father   .  Diabetes Sister   . Diabetes Brother   . Hypertension Brother   . Heart disease Brother   . Heart attack Brother   . Kidney cancer Brother   . Uterine cancer Daughter   . Breast cancer Sister   . Rheum arthritis Maternal Uncle   . Gout Brother   . Kidney failure Brother        x 5    Social History Social History   Tobacco Use  . Smoking status: Never Smoker  . Smokeless tobacco: Never Used  Vaping Use  . Vaping Use: Never used  Substance Use Topics  . Alcohol use: No  . Drug use: No     Allergies   Codeine, Infliximab, and Lisinopril   Review of Systems Review of Systems   Physical Exam Triage Vital Signs ED Triage Vitals  Enc Vitals Group     BP 11/28/19 1324 (!) 151/90     Pulse Rate 11/28/19 1324 89     Resp 11/28/19 1324 17     Temp 11/28/19 1324 97.8 F (36.6 C)     Temp Source 11/28/19 1324 Oral     SpO2 11/28/19 1324 100 %     Weight --      Height --      Head Circumference --      Peak Flow --      Pain Score 11/28/19 1323 10     Pain Loc --      Pain Edu? --      Excl. in [place]? --    No data found.  Updated Vital Signs BP (!) 151/90 (BP Location: Right Arm)   Pulse 89   Temp  97.8 F (36.6 C) (Oral)   Resp 17   SpO2 100%   Visual Acuity Right Eye Distance:   Left Eye Distance:   Bilateral Distance:    Right Eye Near:   Left Eye Near:    Bilateral Near:     Physical Exam Vitals and nursing note reviewed.  Constitutional:      General: She is not in acute distress.    Appearance: Normal appearance. She is not ill-appearing, toxic-appearing or diaphoretic.  HENT:     Head: Normocephalic.     Nose: Nose normal.  Eyes:     Conjunctiva/sclera: Conjunctivae normal.  Pulmonary:     Effort: Pulmonary effort is normal.  Musculoskeletal:        General: Normal range of motion.     Cervical back: Normal range of motion.     Lumbar back: Spasms present.       Back:       Legs:     Comments: Very tender to light touch and  palpation.  Scar from hip surgery tender to touch. No swelling, erythema or increased warmth.    Skin:    General: Skin is warm and dry.     Findings: No rash.  Neurological:     Mental Status: She is alert.  Psychiatric:        Mood and Affect: Mood normal.      UC Treatments / Results  Labs (all labs ordered are listed, but only abnormal results are displayed) Labs Reviewed  POCT URINALYSIS DIP (DEVICE) - Abnormal; Notable for the following components:      Result Value   Leukocytes,Ua SMALL (*)    All other components within normal limits  URINE CULTURE    EKG   Radiology No results found.  Procedures Procedures (including critical care time)  Medications Ordered in UC Medications - No data to display  Initial Impression / Assessment and Plan / UC Course  I have reviewed the triage vital signs and the nursing notes.  Pertinent labs & imaging results that were available during my care of the patient were reviewed by me and considered in my medical decision making (see chart for details).     Flank pain/leg pain Urine with small leuks sending for culture.  Patient currently taking morphine ER and IR for pain.  Patient with chronic kidney disease and unable to take NSAIDs.  She is currently also on prednisone, gabapentin and Flexeril. She has had recent x-rays with no acute findings or changes in the lumbar spine and left hip. No rash to indicate shingles. Believe this is probably worsening chronic pain Recommended she can try to increase in the Flexeril at bedtime to see if this helps.  She may want to contact her primary care or pain management specialist for further advisement Otherwise vital signs are stable and nothing concerning today. Final Clinical Impressions(s) / UC Diagnoses   Final diagnoses:  Flank pain  Left leg pain     Discharge Instructions     We will go ahead and treat for urinary tract infection As far as pain medicine goes there is  nothing else I can give you for pain.  I am sending your urine for culture.  You may need to contact your primary care doctor or pain management doctor.  If the pain worsens you may need to go to the ER.  You can also try some Voltaren gel or salon pas.     ED Prescriptions  Medication Sig Dispense Auth. Provider   cephALEXin (KEFLEX) 500 MG capsule Take 1 capsule (500 mg total) by mouth 2 (two) times daily for 7 days. 14 capsule Elmire Amrein A, NP     PDMP not reviewed this encounter.   Orvan July, NP 11/29/19 [phone]

## 2019-12-05 ENCOUNTER — Ambulatory Visit (INDEPENDENT_AMBULATORY_CARE_PROVIDER_SITE_OTHER): Payer: Medicare Other

## 2019-12-05 ENCOUNTER — Encounter: Payer: Self-pay | Admitting: Physician Assistant

## 2019-12-05 ENCOUNTER — Ambulatory Visit (INDEPENDENT_AMBULATORY_CARE_PROVIDER_SITE_OTHER): Payer: Medicare Other | Admitting: Physician Assistant

## 2019-12-05 ENCOUNTER — Other Ambulatory Visit: Payer: Self-pay

## 2019-12-05 VITALS — Ht 64.0 in | Wt 234.0 lb

## 2019-12-05 DIAGNOSIS — M79605 Pain in left leg: Secondary | ICD-10-CM

## 2019-12-05 DIAGNOSIS — M5416 Radiculopathy, lumbar region: Secondary | ICD-10-CM | POA: Diagnosis not present

## 2019-12-05 NOTE — Progress Notes (Signed)
Office Visit Note   Patient: [name]           Date of Birth: 12-Jan-1941           MRN: [id number] Visit Date: 12/05/2019              Requested by: [name], MD [place],  [place] [address] PCP: [name], MD  Chief Complaint  Patient presents with  . Left Leg - Pain      HPI: This is a pleasant 79 year old woman with a chief complaint of lower back pain that shoots down to her buttocks to her knee.  She is status post intramedullary rodding of a inner troches subtroches fracture in 2018 by Dr. Ninfa Linden.  Also had irrigation and debridement for infection.  She has a little bit of groin pain but does not have any anterior thigh pain.  She does have significant pain in the left buttock.  She has been told that she has spinal stenosis but that she is not a surgical candidate.  She is managed by chronic pain management.  She does not have any studies such as an MRI.  She does ambulate short distances with her walker.  She denies any paresthesias.  Assessment & Plan: Visit Diagnoses:  1. Pain in left leg   2. Lumbar radicular pain     Plan: Have recommended MRI of her lumbar spine with a follow-up with Dr. Ernestina Patches for possible ESI Follow-Up Instructions: No follow-ups on file.   Ortho Exam  Patient is alert, oriented, no adenopathy, well-dressed, normal affect, normal respiratory effort. She is tender to palpation over the lower back into the buttock.  This radiates down to her knee.  Mild tenderness in the groin.  She has good flexion and extension strength in her ankle.  Positive straight leg raise  Imaging: No results found. No images are attached to the encounter.  Labs: Lab Results  Component Value Date   HGBA1C 7.0 (H) 09/22/2019   HGBA1C 7.4 (H) 04/22/2019   HGBA1C 6.5 (A) 02/02/2019   ESRSEDRATE 29 (H) 01/17/2017   ESRSEDRATE 41 (H) 04/19/2015   ESRSEDRATE 3 01/28/2012   CRP 2.4 (H) 01/17/2017   CRP <0.5 (L) 01/28/2012     LABURIC 4.8 04/14/2016   REPTSTATUS 11/29/2019 FINAL 11/28/2019   GRAMSTAIN  01/16/2017    ABUNDANT WBC PRESENT,BOTH PMN AND MONONUCLEAR RARE GRAM POSITIVE COCCI IN PAIRS Performed at [facility], [place] [address], [place], [place] [address]    CULT (A) 11/28/2019    <10,000 COLONIES/mL INSIGNIFICANT GROWTH Performed at [facility] [address], [place], [place] [address]    LABORGA KLEBSIELLA PNEUMONIAE (A) 04/22/2019     Lab Results  Component Value Date   ALBUMIN 3.0 (L) 04/21/2019   ALBUMIN 4.3 05/26/2018   ALBUMIN 3.9 02/22/2018   LABURIC 4.8 04/14/2016    Lab Results  Component Value Date   MG 1.8 04/28/2019   MG 1.9 04/27/2019   MG 1.6 (L) 04/26/2019   Lab Results  Component Value Date   VD25OH 31 04/14/2016    No results found for: PREALBUMIN CBC EXTENDED Latest Ref Rng & Units 11/10/2019 09/22/2019 05/12/2019  WBC 4.0 - 10.5 K/uL 6.7 6.8 7.3  RBC 3.87 - 5.11 MIL/uL 4.02 4.07 3.92  HGB 12.0 - 15.0 g/dL 12.4 12.7 11.9(L)  HCT 36 - 46 % 41.8 39.1 37.4  PLT 150 - 400 K/uL 181 117.0(L) 194.0  NEUTROABS 1.4 - 7.7 K/uL - 4.7 -  LYMPHSABS 0.7 - 4.0 K/uL - 1.4 -     Body mass index is 40.17 kg/m.  Orders:  Orders Placed This Encounter  Procedures  . XR FEMUR MIN 2 VIEWS LEFT  . MR Lumbar Spine w/o contrast   No orders of the defined types were placed in this encounter.    Procedures: No procedures performed  Clinical Data: No additional findings.  ROS:  All other systems negative, except as noted in the HPI. Review of Systems  Objective: Vital Signs: Ht _0  (1.626 m)   Wt 234 lb (106.1 kg)   BMI 40.17 kg/m   Specialty Comments:  No specialty comments available.  PMFS History: Patient Active Problem List   Diagnosis Date Noted  . Diabetic neuropathy ([place]) 05/24/2019  . Sepsis secondary to UTI ([place]) 04/25/2019  . Cellulitis in diabetic foot ([place]) 04/25/2019  . Hypokalemia 04/21/2019  . Anticoagulated 06/25/2018  .  Chronic pain 06/25/2018  . CRI (chronic renal insufficiency), stage 3 (moderate) 06/25/2018  . Drug-induced constipation 06/18/2018  . DVT (deep venous thrombosis) ([place]) 06/03/2018  . Syncope and collapse 06/02/2018  . Chest pain 06/02/2018  . Primary osteoarthritis of both knees 05/26/2018  . Body mass index (BMI) of 50-59.9 in adult ([place]) 05/26/2018  . Acquired hypothyroidism 05/26/2018  . Chronic bilateral low back pain without sciatica 02/17/2018  . Astigmatism with presbyopia, bilateral 03/26/2017  . Cortical age-related cataract of both eyes 03/26/2017  . Family history of glaucoma 03/26/2017  . Long term current use of oral hypoglycemic drug 03/26/2017  . Nuclear sclerotic cataract of both eyes 03/26/2017  . Pain in left hip 02/18/2017  . Type 2 diabetes mellitus ([place]) 01/16/2017  . Closed nondisplaced intertrochanteric fracture of left femur with delayed healing 01/14/2017  . Postoperative wound infection 01/14/2017  . Gout 11/29/2016  . Depression 11/29/2016  . Closed left hip fracture ([place]) 11/29/2016  . GERD (gastroesophageal reflux disease) 11/29/2016  . Closed intertrochanteric fracture of hip, left, initial encounter ([place]) 11/29/2016  . Physical deconditioning 07/31/2015  . Pulmonary fibrosis, postinflammatory ([place]) 07/31/2015  . Bronchiectasis without complication ([place]) 40/98/1191  . Tendinitis of left rotator cuff 06/13/2015  . Acute on chronic diastolic heart failure ([place]) 04/21/2015  . Acute kidney injury superimposed on chronic kidney disease ([place]) 04/17/2015  . Hypotension 04/17/2015  . Chronic respiratory failure with hypoxia ([place]) 02/09/2015  . Dyspnea and respiratory abnormality 01/03/2015  . Normal coronary arteries 11/02/2014  . Morbid obesity ([place]) 10/30/2014  . Dysphagia 08/21/2014  . Chronic fatigue 06/21/2014  . DOE (dyspnea on exertion) 05/24/2014  . Primary osteoarthritis of left knee 04/26/2014  . High risk medication use 04/17/2014  . Aphasia  02/12/2014  . 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 ([place]) 01/18/2014  . Lumbar facet arthropathy 01/18/2014  . Bilateral edema of lower extremity 05/30/2013  . Chronic cough 05/24/2013  . ILD (interstitial lung disease) ([place]) 04/10/2013  . Greater trochanteric bursitis of left hip 08/13/2012  . Spinal stenosis of lumbar region with radiculopathy 07/12/2012  . Inability to walk 07/12/2012  . Asymptomatic bacteriuria 07/12/2012  . Meningioma of left sphenoid wing involving cavernous sinus ([place]) 02/17/2012  . Chest pain of uncertain etiology 47/82/9562  . Rheumatoid arthritis ([place]) 01/28/2012  . Primary osteoarthritis of right knee 01/28/2012  . Dyslipidemia   . Thyroid disease   . Essential hypertension    Past Medical History:  Diagnosis Date  . Acute on chronic diastolic (congestive)  heart failure ([place])   . Anemia    iron deficiency anemia - secondary to blood loss ( chronic)   . Arthritis    endstage changes bilateral knees/bilateral ankles.   . Asthma   . Carotid artery occlusion   . Chronic fatigue   . Chronic kidney disease   . Closed left hip fracture ([place])   . Clotting disorder ([place])    pt denies this  . Contusion of left knee    due to fall 1/14.  [name] COPD (chronic obstructive pulmonary disease) (HCC)    pulmonary fibrosis  . Depression, reactive   . Diabetes mellitus    type II   . Diastolic dysfunction   . Difficulty in walking   . Family history of heart disease   . Generalized muscle weakness   . Gout   . High cholesterol   . History of falling   . Hypertension   . Hypothyroidism   . Interstitial lung disease ([place])   . 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    . Morbid obesity ([place])   . Morbid obesity ([place])   . Neuromuscular disorder ([place])    diabetic neuropathy   . Normal coronary arteries    cardiac catheterization performed  10/31/14  . RA  (rheumatoid arthritis) ([place])    has been off methotreaxte since 10/13.  [name] Rheumatoid arthritis ([place])   . Shortness of breath   . Spinal stenosis of lumbar region   . Thyroid disease   . Unspecified lack of coordination   . URI (upper respiratory infection)     Family History  Problem Relation Age of Onset  . Diabetes Mother   . Heart attack Mother   . Hypertension Father   . Lung cancer Father   . Diabetes Sister   . Diabetes Brother   . Hypertension Brother   . Heart disease Brother   . Heart attack Brother   . Kidney cancer Brother   . Uterine cancer Daughter   . Breast cancer Sister   . Rheum arthritis Maternal Uncle   . Gout Brother   . Kidney failure Brother        x 5    Past Surgical History:  Procedure Laterality Date  . ABDOMINAL HYSTERECTOMY    . BRAIN SURGERY     Gamma knife 10/13. Needs repeat spring  '14  . CARDIAC CATHETERIZATION N/A 10/31/2014   Procedure: Right/Left Heart Cath and Coronary Angiography;  Surgeon: [name], MD;  Location: [facility];  Service: Cardiovascular;  Laterality: N/A;  . ESOPHAGOGASTRODUODENOSCOPY (EGD) WITH PROPOFOL N/A 09/14/2014   Procedure: ESOPHAGOGASTRODUODENOSCOPY (EGD) WITH PROPOFOL;  Surgeon: [name], MD;  Location: [facility];  Service: Endoscopy;  Laterality: N/A;  . INCISION AND DRAINAGE HIP Left 01/16/2017   Procedure: IRRIGATION AND DEBRIDEMENT LEFT HIP;  Surgeon: [name], MD;  Location: [facility];  Service: Orthopedics;  Laterality: Left;  . INTRAMEDULLARY (IM) NAIL INTERTROCHANTERIC Left 11/29/2016   Procedure: INTRAMEDULLARY (IM) NAIL INTERTROCHANTRIC;  Surgeon: [name], MD;  Location: [facility];  Service: Orthopedics;  Laterality: Left;  . OVARY SURGERY    . SHOULDER SURGERY Left   . TONSILLECTOMY  age 49  . VIDEO BRONCHOSCOPY Bilateral 05/31/2013   Procedure: VIDEO BRONCHOSCOPY WITHOUT FLUORO;  Surgeon: Brand Males, MD;  Location: [facility];  Service:  Cardiopulmonary;  Laterality: Bilateral;  . video bronscoscopy  2000  lung   Social History   Occupational History  . Occupation: retired Marine scientist  Tobacco Use  . Smoking status: Never Smoker  . Smokeless tobacco: Never Used  Vaping Use  . Vaping Use: Never used  Substance and Sexual Activity  . Alcohol use: No  . Drug use: No  . Sexual activity: Not Currently

## 2019-12-06 ENCOUNTER — Encounter: Payer: Self-pay | Admitting: Nurse Practitioner

## 2019-12-06 ENCOUNTER — Other Ambulatory Visit: Payer: Medicare Other | Admitting: Nurse Practitioner

## 2019-12-06 DIAGNOSIS — Z515 Encounter for palliative care: Secondary | ICD-10-CM

## 2019-12-06 DIAGNOSIS — M069 Rheumatoid arthritis, unspecified: Secondary | ICD-10-CM | POA: Diagnosis not present

## 2019-12-06 NOTE — Progress Notes (Signed)
[facility] Consult Note Telephone: [phone]  Fax: [phone]  PATIENT NAME: [name] DOB: [birth year] MRN: [id number]  PRIMARY CARE PROVIDER:   [name], MD  REFERRING PROVIDER:  [name], MD [place],  [place] [address]  RESPONSIBLE PARTY:Self; [phone]  Due to the COVID-19 crisis, this visit was done via telemedicine from my office and it was initiated and consent by this patient and or family.  RECOMMENDATIONS and PLAN: 1.ACP: full code currently with aggressive interventions.   2. RA; continue current regimen, pain management  3.Palliative care encounter Palliative medicine team will continue to support patient, patient's family, and medical team. Visit consisted of counseling and education dealing with the complex and emotionally intense issues of symptom management and palliative care in the setting of serious and potentially life-threatening illness I spent 60 minutes providing this consultation,  from 1:00pm to 2:00pm. More than 50% of the time in this consultation was spent coordinating communication.   HISTORY OF PRESENT ILLNESS:  [name] is a 79 y.o. year old female with multiple medical problems including Chronic diastolic congestive heart failure, pulmonary fibrosis, interstitial lung disease, chronic respiratory failure with hypoxia, dysphasia, aphasia, carotid artery occlusion, COPD,history of DVT, chronic kidney disease, Rheumatoid arthritis, anemia, meningioma of left sphenoid wing involving cavernous sinus, hypothyroidism, hypertension, gerd, spinal stenosis of lumbar region,osteoarthritis, hypercholesterolemia, gout, morbid obesity, arthritis, cataract,chronic pain, history of left hip fracture s/p nail, tonsillectomy, left shoulder surgery, ovarian surgery, brain surgery, abdominal hysterectomy. I called [name] for scheduled telemedicine  telephonic is video not available follow-up palliative care visit. [name] and I talked about her recent visit to the emergency department for back pain. We talked about her appointment with her Orthopedic yesterday for leg pain which has been associated with her back pain. Joan. Mcdaniel endorses she was told that she will need an MRI for further investigation of what the problem is. [name] endorses she does continue to take her current pain regiment which she does continue to have pain over all in her leg. Pain is being managed by Orthopedic. We talked about exercises and therapy that she has attempted. Joan. Callanan endorses has made the pain worse. Joan. Mitnick endorses she has been putting ice on her back three times a day which does help. We talked about medical goals of care. We talked about her appetite. [name] endorses she starting to lose weight as her appetite has declined. Joan. Mcdaniel endorses her daughter shared with her she has never seen her legs look so small. We talked about sleep. [name] endorses she has been sleeping a little better. We talked about her functional level. [name] endorses she is still able to do for herself some. They still have help coming into the home for housekeeping and cooking. Her son also helps. We talked about quality of life. We talked about challenges with chronic illness and ability. We talked about coping strategies. We talked about role of palliative care and plan of care. We talked about follow-up visit to be in person in 2 months if needed or sooner said she declined. The scarlet and I talked about by that time and where I should be completed with results in a plan in place. We talked about Joan. Mcdaniel can call palliative any time for sooner visit if needed. List of visit supportive. Questions answered to satisfaction. Therapeutic listening and emotional support provided. Contact information. Palliative Care was  asked to help to continue to address  goals of care.   CODE STATUS: full code  PPS: 60% HOSPICE ELIGIBILITY/DIAGNOSIS: TBD  PAST MEDICAL HISTORY:  Past Medical History:  Diagnosis Date  . Acute on chronic diastolic (congestive) heart failure ([place])   . Anemia    iron deficiency anemia - secondary to blood loss ( chronic)   . Arthritis    endstage changes bilateral knees/bilateral ankles.   . Asthma   . Carotid artery occlusion   . Chronic fatigue   . Chronic kidney disease   . Closed left hip fracture ([place])   . Clotting disorder ([place])    pt denies this  . Contusion of left knee    due to fall 1/14.  [name] COPD (chronic obstructive pulmonary disease) (HCC)    pulmonary fibrosis  . Depression, reactive   . Diabetes mellitus    type II   . Diastolic dysfunction   . Difficulty in walking   . Family history of heart disease   . Generalized muscle weakness   . Gout   . High cholesterol   . History of falling   . Hypertension   . Hypothyroidism   . Interstitial lung disease ([place])   . 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    . Morbid obesity ([place])   . Morbid obesity ([place])   . Neuromuscular disorder ([place])    diabetic neuropathy   . Normal coronary arteries    cardiac catheterization performed  10/31/14  . RA (rheumatoid arthritis) ([place])    has been off methotreaxte since 10/13.  [name] Rheumatoid arthritis ([place])   . Shortness of breath   . Spinal stenosis of lumbar region   . Thyroid disease   . Unspecified lack of coordination   . URI (upper respiratory infection)     SOCIAL HX:  Social History   Tobacco Use  . Smoking status: Never Smoker  . Smokeless tobacco: Never Used  Substance Use Topics  . Alcohol use: No    ALLERGIES:  Allergies  Allergen Reactions  . Codeine Swelling and Other (See Comments)    Facial swelling Chest pain and swelling in legs  Facial swelling  . Infliximab Anaphylaxis    "sent me  into shock" "sent me into shock"  . Lisinopril Swelling    Face and neck swelling Face and neck swelling     PERTINENT MEDICATIONS:  Outpatient Encounter Medications as of 12/06/2019  Medication Sig  . acetaminophen (TYLENOL) 500 MG tablet Take 1,000 mg by mouth every 6 (six) hours as needed for moderate pain.   [name] albuterol (PROVENTIL HFA;VENTOLIN HFA) 108 (90 BASE) MCG/ACT inhaler Inhale 2 puffs into the lungs every 6 (six) hours as needed for wheezing or shortness of breath.  . allopurinol (ZYLOPRIM) 300 MG tablet Take 300 mg by mouth daily.  [name] aspirin buffered (BUFFERIN) 325 MG TABS tablet Take 325 mg by mouth daily.  [name] atorvastatin (LIPITOR) 20 MG tablet Take 10 mg by mouth every evening.   [name] azithromycin (ZITHROMAX) 250 MG tablet Take by mouth daily.  . Calcium Carb-Cholecalciferol (CALCIUM + D3 PO) Take 2 tablets by mouth daily with lunch.   . cholecalciferol (VITAMIN D) 1000 UNITS tablet Take 2,000 Units by mouth daily with lunch.   . colchicine 0.6 MG tablet Take 0.6 mg by mouth daily.  . cyclobenzaprine (FLEXERIL) 5 MG tablet Take 1 tablet (5 mg total) by  mouth at bedtime as needed for muscle spasms. (Patient taking differently: Take 5 mg by mouth at bedtime. )  . diclofenac sodium (VOLTAREN) 1 % GEL Apply 2 g topically 3 (three) times daily. Both hands and knees  . DULoxetine (CYMBALTA) 60 MG capsule Take 1 capsule (60 mg total) by mouth daily.  . fluticasone (FLONASE) 50 MCG/ACT nasal spray Place 2 sprays into both nostrils daily.  . furosemide (LASIX) 80 MG tablet Take 80 mg by mouth.  . gabapentin (NEURONTIN) 600 MG tablet Take two in the morning , Two mid- afternoon and two at bedtime (Patient taking differently: Take 1,200 mg by mouth 3 (three) times daily. Take two in the morning , Two mid- afternoon and two at bedtime)  . guaiFENesin (MUCINEX) 600 MG 12 hr tablet Take 1,200 mg by mouth 2 (two) times daily.  . hydroxychloroquine (PLAQUENIL) 200 MG tablet Take 400 mg by mouth  daily.   [name] ipratropium-albuterol (DUONEB) 0.5-2.5 (3) MG/3ML SOLN Take 3 mLs by nebulization every 6 (six) hours as needed (cough/wheezing).   [name] leflunomide (ARAVA) 10 MG tablet Take 10 mg by mouth daily.  [name] levothyroxine (SYNTHROID) 50 MCG tablet Take 1 tablet (50 mcg total) by mouth daily.  [name] linaclotide (LINZESS) 72 MCG capsule Take 1 capsule (72 mcg total) by mouth daily before breakfast.  . metolazone (ZAROXOLYN) 2.5 MG tablet Take 1 tablet by mouth daily as needed for 2 days for weight above 255 lbs.  . metoprolol succinate (TOPROL XL) 25 MG 24 hr tablet Take 3 tablets (75 mg total) by mouth daily.  [name] morphine (Joan CONTIN) 15 MG 12 hr tablet Take 1 tablet (15 mg total) by mouth every 12 (twelve) hours.  [name] morphine (MSIR) 15 MG tablet Take 1 tablet (15 mg total) by mouth every 8 (eight) hours as needed for moderate pain ([facility]).  . Multiple Vitamin (MULTIVITAMIN WITH MINERALS) TABS Take 1 tablet by mouth daily.   [name] olmesartan (BENICAR) 40 MG tablet Take 1 tablet (40 mg total) by mouth daily.  . OXYGEN-HELIUM IN Inhale 2 L into the lungs at bedtime. And on exertion  . pantoprazole (PROTONIX) 40 MG tablet take 1 tablet by mouth once daily MINUTES BEFORE 1ST MEAL OF THE DAY  . potassium chloride SA (KLOR-CON) 20 MEQ tablet Take 1 tablet (20 mEq total) by mouth daily.  . predniSONE (DELTASONE) 5 MG tablet Take 10 mg by mouth every morning.   . Semaglutide,0.25 or 0.5MG/DOS, (OZEMPIC, 0.25 OR 0.5 MG/DOSE,) 2 MG/1.5ML SOPN Inject 0.5 mg into the skin once a week.  . sodium chloride (OCEAN) 0.65 % SOLN nasal spray Place 1 spray into both nostrils as needed for congestion.  [name] Spacer/Aero-Holding Chambers (AEROCHAMBER MV) inhaler Use as instructed   No facility-administered encounter medications on file as of 12/06/2019.    PHYSICAL EXAM:  Deferred  Alexiz Cothran Z Wynter Grave, NP

## 2019-12-09 NOTE — Telephone Encounter (Signed)
This provider is not handling patient's pain management.  Patient will need to reach out to that provider for refills as she is likely under pain management contract.

## 2019-12-13 NOTE — Telephone Encounter (Signed)
This encounter has been resolved, pt pain management provided pain medication

## 2019-12-14 ENCOUNTER — Encounter: Payer: Self-pay | Admitting: Registered Nurse

## 2019-12-14 ENCOUNTER — Other Ambulatory Visit: Payer: Self-pay

## 2019-12-14 ENCOUNTER — Encounter: Payer: Medicare Other | Attending: Physical Medicine & Rehabilitation | Admitting: Registered Nurse

## 2019-12-14 ENCOUNTER — Other Ambulatory Visit: Payer: Self-pay | Admitting: Cardiology

## 2019-12-14 VITALS — BP 151/78 | HR 103 | Temp 98.2°F | Ht 64.0 in | Wt 240.2 lb

## 2019-12-14 DIAGNOSIS — G894 Chronic pain syndrome: Secondary | ICD-10-CM | POA: Insufficient documentation

## 2019-12-14 DIAGNOSIS — M545 Low back pain, unspecified: Secondary | ICD-10-CM

## 2019-12-14 DIAGNOSIS — M47816 Spondylosis without myelopathy or radiculopathy, lumbar region: Secondary | ICD-10-CM | POA: Insufficient documentation

## 2019-12-14 DIAGNOSIS — M48062 Spinal stenosis, lumbar region with neurogenic claudication: Secondary | ICD-10-CM | POA: Insufficient documentation

## 2019-12-14 DIAGNOSIS — Z90710 Acquired absence of both cervix and uterus: Secondary | ICD-10-CM | POA: Diagnosis not present

## 2019-12-14 DIAGNOSIS — M7062 Trochanteric bursitis, left hip: Secondary | ICD-10-CM | POA: Diagnosis not present

## 2019-12-14 DIAGNOSIS — M5416 Radiculopathy, lumbar region: Secondary | ICD-10-CM

## 2019-12-14 DIAGNOSIS — G8929 Other chronic pain: Secondary | ICD-10-CM | POA: Diagnosis not present

## 2019-12-14 DIAGNOSIS — Z79891 Long term (current) use of opiate analgesic: Secondary | ICD-10-CM | POA: Insufficient documentation

## 2019-12-14 DIAGNOSIS — M069 Rheumatoid arthritis, unspecified: Secondary | ICD-10-CM | POA: Diagnosis not present

## 2019-12-14 DIAGNOSIS — Z9889 Other specified postprocedural states: Secondary | ICD-10-CM | POA: Diagnosis not present

## 2019-12-14 DIAGNOSIS — F329 Major depressive disorder, single episode, unspecified: Secondary | ICD-10-CM | POA: Insufficient documentation

## 2019-12-14 DIAGNOSIS — E1142 Type 2 diabetes mellitus with diabetic polyneuropathy: Secondary | ICD-10-CM | POA: Insufficient documentation

## 2019-12-14 DIAGNOSIS — M1712 Unilateral primary osteoarthritis, left knee: Secondary | ICD-10-CM | POA: Insufficient documentation

## 2019-12-14 DIAGNOSIS — Z5181 Encounter for therapeutic drug level monitoring: Secondary | ICD-10-CM | POA: Insufficient documentation

## 2019-12-14 DIAGNOSIS — E1122 Type 2 diabetes mellitus with diabetic chronic kidney disease: Secondary | ICD-10-CM | POA: Insufficient documentation

## 2019-12-14 DIAGNOSIS — M1711 Unilateral primary osteoarthritis, right knee: Secondary | ICD-10-CM | POA: Diagnosis not present

## 2019-12-14 MED ORDER — TAPENTADOL HCL ER 50 MG PO TB12: 50.0000 mg | ORAL_TABLET | Freq: Two times a day (BID) | ORAL | 0 refills | Status: DC

## 2019-12-14 MED ORDER — CYCLOBENZAPRINE HCL 5 MG PO TABS: 5.0000 mg | ORAL_TABLET | Freq: Every evening | ORAL | 3 refills | Status: DC | PRN

## 2019-12-14 NOTE — Progress Notes (Signed)
Subjective:    Patient ID: [name], female    DOB: 10-27-1940, 79 y.o.   MRN: [id number]  HPI: [name] is a 79 y.o. female who returns for follow up appointment for chronic pain and medication refill. She states her pain is located in her lower back radiating into her left hip and left lower extremity. Also reports her neuropathic pain has increased in intensity in her lower back radiating into her left hip and left lower extremity and she's  not receiving adequate relief of her pain with current medication regimen. When she takes her MSIR she's only receiving 2- 3 hours of relief of her pain and with her MS Contin she's only receiving about 6-8 hours of relief of her pain. We discussed different treatment modalities.  We discussed Tapentadol ( Nucynta),Nucynta has a dual proposed mechanism of action ascending pathways inhibit nociceptive pain signals through activation of mu-opioid receptors and descending pathways inhibit pain impulses through norepinephrine reuptake inhibition. .  She is currently prescribed MS Contin and MSIR with minamal relief noted in her pain.  Also was prescribed Lyrica this was discontinued due to ineffectiveness . She is currently on Gabapentin and Cymbalta and she is still experiencing increase intensity of neuropathic pain, we will prescribe Nucynta. [name] is in agreement with treatment modality.   The above was discussed with Dr Letta Pate, since Dr Naaman Plummer is on vacation, he agrees with Tapentadol.   She rates her pain 9. Her current exercise regime is walking short distances with her walker to the bathroom only, otherwise she is in her wheelchair.   [name] Morphine equivalent is 75.00  MME.  Last UDS was Performed on 10/19/2019, it was consistent.   Pain Inventory Average Pain 9 Pain Right Now 9 My pain is constant, sharp and stabbing  In the last 24 hours, has pain interfered with the following? General activity 9 Relation with  others 8 Enjoyment of life 9 What TIME of day is your pain at its worst? Around the clock.  Sleep (in general) Poor  Pain is worse with: walking, bending and standing Pain improves with: heat/ice, therapy/exercise and medication Relief from Meds: 3  Mobility use a walker how many minutes can you walk? none ability to climb steps?  no do you drive?  no use a wheelchair  Function retired I need assistance with the following:  dressing, bathing, toileting, meal prep, household duties and shopping Do you have any goals in this area?  yes  Neuro/Psych bladder control problems weakness trouble walking  Prior Studies Any changes since last visit?  yes x-rays  Physicians involved in your care Any changes since last visit?  no   Family History  Problem Relation Age of Onset  . Diabetes Mother   . Heart attack Mother   . Hypertension Father   . Lung cancer Father   . Diabetes Sister   . Diabetes Brother   . Hypertension Brother   . Heart disease Brother   . Heart attack Brother   . Kidney cancer Brother   . Uterine cancer Daughter   . Breast cancer Sister   . Rheum arthritis Maternal Uncle   . Gout Brother   . Kidney failure Brother        x 5   Social History   Socioeconomic History  . Marital status: Married    Spouse name: Not on file  . Number of children: 6  . Years of education: college  .  Highest education level: Not on file  Occupational History  . Occupation: retired Marine scientist  Tobacco Use  . Smoking status: Never Smoker  . Smokeless tobacco: Never Used  Vaping Use  . Vaping Use: Never used  Substance and Sexual Activity  . Alcohol use: No  . Drug use: No  . Sexual activity: Not Currently  Other Topics Concern  . Not on file  Social History Narrative   Patient consumes 2-3 cups coffee per day.   Social Determinants of Health   Financial Resource Strain:   . Difficulty of Paying Living Expenses:   Food Insecurity:   . Worried About Paediatric nurse in the Last Year:   . Arboriculturist in the Last Year:   Transportation Needs:   . Film/video editor (Medical):   [name] Lack of Transportation (Non-Medical):   Physical Activity:   . Days of Exercise per Week:   . Minutes of Exercise per Session:   Stress:   . Feeling of Stress :   Social Connections:   . Frequency of Communication with Friends and Family:   . Frequency of Social Gatherings with Friends and Family:   . Attends Religious Services:   . Active Member of Clubs or Organizations:   . Attends Archivist Meetings:   [name] Marital Status:    Past Surgical History:  Procedure Laterality Date  . ABDOMINAL HYSTERECTOMY    . BRAIN SURGERY     Gamma knife 10/13. Needs repeat spring  '14  . CARDIAC CATHETERIZATION N/A 10/31/2014   Procedure: Right/Left Heart Cath and Coronary Angiography;  Surgeon: [name], MD;  Location: [facility];  Service: Cardiovascular;  Laterality: N/A;  . ESOPHAGOGASTRODUODENOSCOPY (EGD) WITH PROPOFOL N/A 09/14/2014   Procedure: ESOPHAGOGASTRODUODENOSCOPY (EGD) WITH PROPOFOL;  Surgeon: [name], MD;  Location: [facility];  Service: Endoscopy;  Laterality: N/A;  . INCISION AND DRAINAGE HIP Left 01/16/2017   Procedure: IRRIGATION AND DEBRIDEMENT LEFT HIP;  Surgeon: [name], MD;  Location: [facility];  Service: Orthopedics;  Laterality: Left;  . INTRAMEDULLARY (IM) NAIL INTERTROCHANTERIC Left 11/29/2016   Procedure: INTRAMEDULLARY (IM) NAIL INTERTROCHANTRIC;  Surgeon: [name], MD;  Location: [facility];  Service: Orthopedics;  Laterality: Left;  . OVARY SURGERY    . SHOULDER SURGERY Left   . TONSILLECTOMY  age 47  . VIDEO BRONCHOSCOPY Bilateral 05/31/2013   Procedure: VIDEO BRONCHOSCOPY WITHOUT FLUORO;  Surgeon: Brand Males, MD;  Location: [facility];  Service: Cardiopulmonary;  Laterality: Bilateral;  . video bronscoscopy  2000   lung   Past Medical History:  Diagnosis Date  .  Acute on chronic diastolic (congestive) heart failure ([place])   . Anemia    iron deficiency anemia - secondary to blood loss ( chronic)   . Arthritis    endstage changes bilateral knees/bilateral ankles.   . Asthma   . Carotid artery occlusion   . Chronic fatigue   . Chronic kidney disease   . Closed left hip fracture ([place])   . Clotting disorder ([place])    pt denies this  . Contusion of left knee    due to fall 1/14.  [name] COPD (chronic obstructive pulmonary disease) ([facility])    pulmonary fibrosis  . Depression, reactive   . Diabetes mellitus    type II   . Diastolic dysfunction   . Difficulty in walking   . Family history of heart disease   . Generalized muscle weakness   .  Gout   . High cholesterol   . History of falling   . Hypertension   . Hypothyroidism   . Interstitial lung disease ([place])   . 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    . Morbid obesity ([place])   . Morbid obesity ([place])   . Neuromuscular disorder ([place])    diabetic neuropathy   . Normal coronary arteries    cardiac catheterization performed  10/31/14  . RA (rheumatoid arthritis) ([place])    has been off methotreaxte since 10/13.  [name] Rheumatoid arthritis ([place])   . Shortness of breath   . Spinal stenosis of lumbar region   . Thyroid disease   . Unspecified lack of coordination   . URI (upper respiratory infection)    BP (!) 151/78   Pulse 103   Temp 98.2 F (36.8 C)   Ht _0  (1.626 m)   Wt (!) 240 lb 3.2 oz (109 kg)   SpO2 90%   BMI 41.23 kg/m   Opioid Risk Score:   Fall Risk Score:  `1  Depression screen PHQ 2/9  Depression screen [facility] 2/9 09/22/2019 06/11/2018 05/26/2018 12/09/2017 10/22/2017 09/22/2017 07/28/2017  Decreased Interest _1 0 0 0  Down, Depressed, Hopeless _2 0 0 0  PHQ - 2 Score _3 0 0 0  Altered sleeping _4 - - - -  Tired, decreased energy _5 - - - -  Change in appetite _6 - - - -    Feeling bad or failure about yourself  _7 - - - -  Trouble concentrating 2 2 0 - - - -  Moving slowly or fidgety/restless 1 0 2 - - - -  Suicidal thoughts 0 0 0 - - - -  PHQ-9 Score _8 - - - -  Difficult doing work/chores Somewhat difficult - Somewhat difficult - - - -  Some recent data might be hidden    Review of Systems  Constitutional: Negative.   HENT: Negative.   Eyes: Negative.   Respiratory: Negative.   Gastrointestinal: Negative.   Endocrine: Negative.   Genitourinary: Positive for urgency.  Musculoskeletal: Positive for back pain and gait problem.  Skin: Negative.   Allergic/Immunologic: Negative.   Neurological: Positive for numbness.  Hematological: Negative.   Psychiatric/Behavioral: Negative.        Objective:   Physical Exam Vitals and nursing note reviewed.  Constitutional:      Appearance: Normal appearance.  Cardiovascular:     Rate and Rhythm: Normal rate and regular rhythm.     Pulses: Normal pulses.     Heart sounds: Normal heart sounds.  Pulmonary:     Effort: Pulmonary effort is normal.     Breath sounds: Normal breath sounds.  Musculoskeletal:     Cervical back: Normal range of motion and neck supple.     Comments: Normal Muscle Bulk and Muscle Testing Reveals:  Upper Extremities: Full  ROM and Muscle Strength 5/5 Bilateral AC Joint Tenderness  Thoracic Hypersensitivity  Lumbar Hypersensitivity Bilateral Greater Trochanter Tenderness Lower Extremities: Decreased ROM and Muscle Strength 4/5 Bilateral Lower Extremities Flexion Produces Pain into her Bilateral Lower Extremities Arrived in Wheelchair   Skin:    General: Skin is warm and dry.  Neurological:     Mental Status: She is alert and oriented to person, place, and  time.  Psychiatric:        Mood and Affect: Mood normal.        Behavior: Behavior normal.           Assessment & Plan:  1. Acute Exacerbation of Chronic Low Back Pain: She's scheduled for a MRI on  12/29/2019. We will prescribe Tapentadol ( Nucynta), awaiting approval from [place]. At this time Ms. Myles will continue with current medication regimen. Ms. Hendriks is aware of the above and verbalizes understanding. Ms. Lavoy has an appointment with Dr Ernestina Patches.  The above medication was discussed with [facility] Outpatient pharmacist, no interactions. Will speak with Dr Letta Pate in am since Dr Naaman Plummer is on vacation. Ms. Lover is aware of the above.  2. Lumbar spinal stenosis with neurogenic claudication. Associated facet arthropathy/ Lumbar Radiculitis: Continuecurrent medication regime withGabapentin.  RX: Nucynta 50 mg one tablet every 6 hours #120 Awaiting on approval from [place] for Nucynta. 12/14/2019. Continue :MS Contin 15 mg one tablet every 12 hours #60 and MSIR 15 one tablet twice a dayas neededFor break through pain( 6 am and 1:00 pm)#60.Marland KitchenWe will continue the opioid monitoring program, this consists of regular clinic visits, examinations, urine drug screen, pill counts as well as use of [place] Controlled Substance reporting System. 3. Depression: Continuecurrent medication Regime:Cymbalta.12/14/2019 4. Diabetes mellitus type 2 with polyneuropathy: Continuecurrent medication regime:Gabapentin.12/14/2019 5. Rheumatoid arthritis and osteoarthritis. Continuecurrent medication Regime.Voltaren Gel. Rheumatology Following.12/14/2019 6. Interstitial lung disease: Pulmonology Following.12/14/2019. 7. Bilateral Osteoarthritis Knee's: Continuecurrent medication regimen.Voltaren Gel.12/14/2019. 8. Muscle Spasm: Continuecurrent medication regime: Flexeril.12/14/2019 9.BilateralGreater Trochanteric Tenderness:L>R.Continuecurrent treatmentwith Ice/Heat Therapy. Continue to Monitor. 12/14/2019. 10. Polyarthralgia: Rheumatology Following: Continue to monitor.12/14/2019 11. Morbid Obesitity: Continue Healthy Diet Regime and HEP. Continue to  monitor.12/14/2019  30 minutes of face to face patient care time was spent during this visit. All questions were encouraged and answered.   F/U in 1 month

## 2019-12-15 ENCOUNTER — Telehealth: Payer: Self-pay | Admitting: *Deleted

## 2019-12-15 ENCOUNTER — Encounter: Payer: Self-pay | Admitting: Registered Nurse

## 2019-12-15 MED ORDER — TAPENTADOL HCL 50 MG PO TABS: 50.0000 mg | ORAL_TABLET | Freq: Four times a day (QID) | ORAL | 0 refills | Status: DC | PRN

## 2019-12-15 NOTE — Telephone Encounter (Signed)
Mrs Knodel called and says she has "info about the medication".

## 2019-12-16 MED ORDER — TAPENTADOL HCL 50 MG PO TABS: 50.0000 mg | ORAL_TABLET | Freq: Four times a day (QID) | ORAL | 0 refills | Status: DC | PRN

## 2019-12-16 NOTE — Telephone Encounter (Signed)
Return Ms. Devaul call, she states we can send her Nucynta prescription to [facility], she spoke with the [place] representative. Nucynta prescription e-scribed today with a Do Not Fill before 12/23/2019. She was instructed to call office in two weeks once she begins the [facility] with an update on medication change, she verbalizes understanding.

## 2019-12-26 ENCOUNTER — Encounter: Payer: Self-pay | Admitting: Physical Therapy

## 2019-12-26 ENCOUNTER — Ambulatory Visit: Payer: Medicare Other | Attending: Internal Medicine | Admitting: Physical Therapy

## 2019-12-26 ENCOUNTER — Other Ambulatory Visit: Payer: Self-pay

## 2019-12-26 DIAGNOSIS — R2689 Other abnormalities of gait and mobility: Secondary | ICD-10-CM | POA: Insufficient documentation

## 2019-12-26 DIAGNOSIS — M5442 Lumbago with sciatica, left side: Secondary | ICD-10-CM | POA: Insufficient documentation

## 2019-12-26 DIAGNOSIS — M25552 Pain in left hip: Secondary | ICD-10-CM | POA: Insufficient documentation

## 2019-12-26 DIAGNOSIS — M79605 Pain in left leg: Secondary | ICD-10-CM | POA: Diagnosis not present

## 2019-12-26 DIAGNOSIS — G8929 Other chronic pain: Secondary | ICD-10-CM | POA: Insufficient documentation

## 2019-12-26 DIAGNOSIS — R293 Abnormal posture: Secondary | ICD-10-CM | POA: Insufficient documentation

## 2019-12-26 NOTE — Therapy (Signed)
[facility] [address] [place], [place], [address] Phone: [phone]   Fax:  [phone]  Physical Therapy Evaluation  Patient Details  Name: [name] MRN: [id number] Date of Birth: 1941-02-25 Referring Provider (PT): Isaac Bliss, Rayford Halsted, MD   Encounter Date: 12/26/2019   PT End of Session - 12/26/19 1259    Visit Number 1    Date for PT Re-Evaluation 02/20/20    Authorization Type Medicare - KX at visit 15    PT Start Time 1200    PT Stop Time 1155    PT Time Calculation (min) 35 min    Equipment Utilized During Treatment Other (comment)   Pt arrived in push wheelchair   Activity Tolerance Patient limited by pain    Behavior During Therapy [facility] for tasks assessed/performed           Past Medical History:  Diagnosis Date  . Acute on chronic diastolic (congestive) heart failure ([place])   . Anemia    iron deficiency anemia - secondary to blood loss ( chronic)   . Arthritis    endstage changes bilateral knees/bilateral ankles.   . Asthma   . Carotid artery occlusion   . Chronic fatigue   . Chronic kidney disease   . Closed left hip fracture ([place])   . Clotting disorder ([place])    pt denies this  . Contusion of left knee    due to fall 1/14.  [name] COPD (chronic obstructive pulmonary disease) (HCC)    pulmonary fibrosis  . Depression, reactive   . Diabetes mellitus    type II   . Diastolic dysfunction   . Difficulty in walking   . Family history of heart disease   . Generalized muscle weakness   . Gout   . High cholesterol   . History of falling   . Hypertension   . Hypothyroidism   . Interstitial lung disease ([place])   . 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    . Morbid obesity ([place])   . Morbid obesity ([place])   . Neuromuscular disorder ([place])    diabetic neuropathy   . Normal coronary arteries     cardiac catheterization performed  10/31/14  . RA (rheumatoid arthritis) ([place])    has been off methotreaxte since 10/13.  [name] Rheumatoid arthritis ([place])   . Shortness of breath   . Spinal stenosis of lumbar region   . Thyroid disease   . Unspecified lack of coordination   . URI (upper respiratory infection)     Past Surgical History:  Procedure Laterality Date  . ABDOMINAL HYSTERECTOMY    . BRAIN SURGERY     Gamma knife 10/13. Needs repeat spring  '14  . CARDIAC CATHETERIZATION N/A 10/31/2014   Procedure: Right/Left Heart Cath and Coronary Angiography;  Surgeon: [name], MD;  Location: [facility];  Service: Cardiovascular;  Laterality: N/A;  . ESOPHAGOGASTRODUODENOSCOPY (EGD) WITH PROPOFOL N/A 09/14/2014   Procedure: ESOPHAGOGASTRODUODENOSCOPY (EGD) WITH PROPOFOL;  Surgeon: [name], MD;  Location: [facility];  Service: Endoscopy;  Laterality: N/A;  . INCISION AND DRAINAGE HIP Left 01/16/2017   Procedure: IRRIGATION AND DEBRIDEMENT LEFT HIP;  Surgeon: [name], MD;  Location: [facility];  Service: Orthopedics;  Laterality: Left;  . INTRAMEDULLARY (IM) NAIL INTERTROCHANTERIC Left 11/29/2016   Procedure: INTRAMEDULLARY (IM) NAIL INTERTROCHANTRIC;  Surgeon: [name], MD;  Location: [facility];  Service: Orthopedics;  Laterality: Left;  . OVARY SURGERY    . SHOULDER SURGERY Left   . TONSILLECTOMY  age 34  . VIDEO BRONCHOSCOPY Bilateral 05/31/2013   Procedure: VIDEO BRONCHOSCOPY WITHOUT FLUORO;  Surgeon: Brand Males, MD;  Location: [facility];  Service: Cardiopulmonary;  Laterality: Bilateral;  . video bronscoscopy  2000   lung    There were no vitals filed for this visit.    Subjective Assessment - 12/26/19 1204    Subjective Pt has chronic LBP which worsened approx 7 weeks ago when she tried to do more walking.  Pt was hospitalized in Jan 2021 x 10 days with sepsis and got weak.  Has been trying to get strong again.  Pt also has Lt LE  pain from hip to knee.  Pt arrives in W/C but typically uses walker (rollator).    Pertinent History PMH: RA, DM, OA bil knees, lumbar stenosis, PSH: Lt femur ORIF 12/2016    Limitations Standing;Walking    How long can you sit comfortably? better in sitting    How long can you stand comfortably? 1 min    How long can you walk comfortably? before flare up could do household ambuation, using walking can now walk from bed to bathroom    Diagnostic tests MRI scheduled for 8/10, 10/2019 lumbar xray diffuse DDD thoracolumbar spine, Gr I anterolisthesis L4/5, scoliosis    Patient Stated Goals learn some exercises that don't cause pain, reduce with pain with activity    Currently in Pain? Yes    Pain Score 9     Pain Location Back    Pain Orientation Left;Mid    Pain Descriptors / Indicators Sharp    Pain Type Chronic pain;Acute pain    Pain Radiating Towards Lt hip and lateral thigh to knee    Pain Onset More than a month ago    Pain Frequency Constant    Aggravating Factors  standing, walking, reaching, sit to stand, bed mobility    Pain Relieving Factors ice on back and leg              [facility] PT Assessment - 12/26/19 0001      Assessment   Medical Diagnosis R26.2 (ICD-10-CM) - Inability to walk M54.5,G89.29 (ICD-10-CM) - Chronic bilateral low back pain without sciatica     Referring Provider (PT) Isaac Bliss, Rayford Halsted, MD    Hand Dominance Right    Next MD Visit --   yes   Prior Therapy yes      Precautions   Precautions None      Restrictions   Weight Bearing Restrictions No      Balance Screen   Has the patient fallen in the past 6 months No    Has the patient had a decrease in activity level because of a fear of falling?  No    Is the patient reluctant to leave their home because of a fear of falling?  No      Home Environment   Living Environment Private residence    Living Arrangements Spouse/significant other;Children    Available Help at Discharge Family    Type  of [place] Access Level entry    [place] Two level;Able to live on main level with bedroom/bathroom    [place] - 4 wheels   wheelchair     Prior Function   Level of Independence Requires assistive device for independence;Needs assistance with  homemaking   has helper come in 4x/week   Vocation Retired    Leisure reading      Cognition   Overall Cognitive Status Within Functional Limits for tasks assessed      Observation/Other Assessments   Observations Pt in slouched sitting in wheelchair    Focus on Therapeutic Outcomes (FOTO)  deferred due to Pt 15 min late      Functional Tests   Functional tests Sit to Stand      Sit to Stand   Comments uses bil UEs, flexed trunk and signif pain limiting ability to maintain standing      Posture/Postural Control   Posture/Postural Control Postural limitations    Postural Limitations Flexed trunk      ROM / Strength   AROM / PROM / Strength AROM;Strength      AROM   Overall AROM Comments trunk ROM modified assessment from wheelchair due to signif pain on standing: pain with all trunk motion, limited 50-75% bil      Strength   Overall Strength Comments bil LE strength 3+/5 to 4/5 throughout      Palpation   Palpation comment signif tenderness present bil thoracic and lumbar paraspinals, bil SI joint lines, spinous processes along midline L1-L5, bil QL      Ambulation/Gait   Gait Comments Pt will bring rollator next time, limited time to assess today due to Pt 15 min late      Wheelchair Mobility   Wheelchair Mobility --                      Objective measurements completed on examination: See above findings.                 PT Short Term Goals - 12/26/19 1311      PT SHORT TERM GOAL #1   Title Pt will be ind with initial HEP    Time 3    Period Weeks    Status New    Target Date 01/16/20      PT SHORT TERM GOAL #2   Title Pt will be able to tolerate light household or  self-care standing tasks for up to 3 min before needing seated break, using rollator.    Baseline -    Time 4    Period Weeks    Status New    Target Date 01/23/20      PT SHORT TERM GOAL #3   Title Pt will learn proper bed mobility with use of core to reduce pain with this funcitonal activity.    Baseline --    Time 4    Period Weeks    Status New    Target Date 01/23/20             PT Long Term Goals - 12/26/19 1313      PT LONG TERM GOAL #1   Title Pt will be able to tolerate ambulation with rollator x 240 feet for short community outings to MD visits or grocery store.    Baseline -    Time 8    Period Weeks    Status New    Target Date 02/20/20      PT LONG TERM GOAL #2   Title Pt will be able to tolerate light standing household tasks for up to 10 min before needing seated break to demo improved endurance.    Baseline -    Time 8    Period Weeks  Status New    Target Date 02/20/20      PT LONG TERM GOAL #3   Title Pt will demo trunk ROM WFL for improved ease for self-care tasks such as bathing and dressing.    Baseline -    Time 8    Period Weeks    Status New    Target Date 02/20/20      PT LONG TERM GOAL #4   Title Pt will report improved LBP and Lt LE pain by at least 50% with transfers and ADLs    Baseline -    Time 8    Period Weeks    Status New    Target Date 02/20/20      PT LONG TERM GOAL #5   Title Pt will achieve LE strength of at least 4+/5 throughout bil LEs for improved transfers and gait    Baseline -    Time 8    Period Weeks    Status New    Target Date 02/20/20                  Plan - 12/26/19 1300    Clinical Impression Statement Pt is a pleasant 79yo female who arrived in push wheelchair with history of exacerbation of chronic LBP, Lt hip pain and lateral thigh pain.  She was hospitalized in Jan x 10 days and has become weaker.  She flared up when she tried to increase her walking and exercise.  She has her adult son,  husband and a home aide x 4 days a week to assist with household tasks and community outings.  She uses a rollator at home but reports she doesn't do much household ambulation at this time.  Assessment was limited today secondary to Pt late x 15 min and due to severe pain with standing.  PT assessed trunk ROM and strength in sitting.  She has pain with all trunk motions which are limited 50-75%.  LE strength ranges from 3+/5 to 4/5 bil.  She was able to use bil UEs to perform a single sit to stand but with severe pain on standing and flexed trunk.  PT initiated seated HEP for LE and postural strength.  Pt will benefit from skilled PT to improve strength, mobility, gait endurance and independence for basic household and care tasks.    Personal Factors and Comorbidities Age;Time since onset of injury/illness/exacerbation;Comorbidity 1;Comorbidity 2;Comorbidity 3+    Comorbidities obesity, RA, DM, lumbar stenosis, Lt femur ORIF    Examination-Activity Limitations Locomotion Level;Transfers;Bed Mobility;Reach Overhead;Bend;Toileting;Lift;Stand;Dressing;Squat;Sleep    Examination-Participation Restrictions Community Activity;Driving;Meal Prep;Laundry;Shop    Stability/Clinical Decision Making Stable/Uncomplicated    Clinical Decision Making Low    Rehab Potential Good    PT Frequency 2x / week    PT Duration 12 weeks    PT Treatment/Interventions ADLs/Self Care Home Management;Aquatic Therapy;Electrical Stimulation;Cryotherapy;Moist Heat;DME Instruction;Gait training;Functional mobility training;Therapeutic activities;Therapeutic exercise;Balance training;Neuromuscular re-education;Manual techniques;Wheelchair mobility training;Patient/family education;Passive range of motion;Energy conservation;Spinal Manipulations;Joint Manipulations    PT Next Visit Plan review HEP from eval, assess gait with rollator, try NuStep and/or UBE, work on bed mobility, STM Lt hip    PT Home Exercise Plan Access Code [email or web address]     Consulted and Agree with Plan of Care Patient;Family member/caregiver    Family Member Consulted [name]           Patient will benefit from skilled therapeutic intervention in order to improve the following deficits and impairments:  Abnormal gait, Decreased range of motion,  Difficulty walking, Obesity, Increased muscle spasms, Decreased activity tolerance, Pain, Improper body mechanics, Postural dysfunction, Decreased strength, Decreased mobility  Visit Diagnosis: Chronic left-sided low back pain with left-sided sciatica - Plan: PT plan of care cert/re-cert  Pain in left hip - Plan: PT plan of care cert/re-cert  Other abnormalities of gait and mobility - Plan: PT plan of care cert/re-cert  Abnormal posture - Plan: PT plan of care cert/re-cert  Pain in left leg - Plan: PT plan of care cert/re-cert     Problem List Patient Active Problem List   Diagnosis Date Noted  . Diabetic neuropathy ([place]) 05/24/2019  . Sepsis secondary to UTI ([place]) 04/25/2019  . Cellulitis in diabetic foot ([place]) 04/25/2019  . Hypokalemia 04/21/2019  . Anticoagulated 06/25/2018  . Chronic pain 06/25/2018  . CRI (chronic renal insufficiency), stage 3 (moderate) 06/25/2018  . Drug-induced constipation 06/18/2018  . DVT (deep venous thrombosis) ([place]) 06/03/2018  . Syncope and collapse 06/02/2018  . Chest pain 06/02/2018  . Primary osteoarthritis of both knees 05/26/2018  . Body mass index (BMI) of 50-59.9 in adult ([place]) 05/26/2018  . Acquired hypothyroidism 05/26/2018  . Chronic bilateral low back pain without sciatica 02/17/2018  . Astigmatism with presbyopia, bilateral 03/26/2017  . Cortical age-related cataract of both eyes 03/26/2017  . Family history of glaucoma 03/26/2017  . Long term current use of oral hypoglycemic drug 03/26/2017  . Nuclear sclerotic cataract of both eyes 03/26/2017  . Pain in left hip 02/18/2017  . Type 2 diabetes mellitus ([place]) 01/16/2017  . Closed nondisplaced  intertrochanteric fracture of left femur with delayed healing 01/14/2017  . Postoperative wound infection 01/14/2017  . Gout 11/29/2016  . Depression 11/29/2016  . Closed left hip fracture ([place]) 11/29/2016  . GERD (gastroesophageal reflux disease) 11/29/2016  . Closed intertrochanteric fracture of hip, left, initial encounter ([place]) 11/29/2016  . Physical deconditioning 07/31/2015  . Pulmonary fibrosis, postinflammatory ([place]) 07/31/2015  . Bronchiectasis without complication ([place]) 81/44/8185  . Tendinitis of left rotator cuff 06/13/2015  . Acute on chronic diastolic heart failure ([place]) 04/21/2015  . Acute kidney injury superimposed on chronic kidney disease ([place]) 04/17/2015  . Hypotension 04/17/2015  . Chronic respiratory failure with hypoxia ([place]) 02/09/2015  . Dyspnea and respiratory abnormality 01/03/2015  . Normal coronary arteries 11/02/2014  . Morbid obesity ([place]) 10/30/2014  . Dysphagia 08/21/2014  . Chronic fatigue 06/21/2014  . DOE (dyspnea on exertion) 05/24/2014  . Primary osteoarthritis of left knee 04/26/2014  . High risk medication use 04/17/2014  . Aphasia 02/12/2014  . 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 ([place]) 01/18/2014  . Lumbar facet arthropathy 01/18/2014  . Bilateral edema of lower extremity 05/30/2013  . Chronic cough 05/24/2013  . ILD (interstitial lung disease) ([place]) 04/10/2013  . Greater trochanteric bursitis of left hip 08/13/2012  . Spinal stenosis of lumbar region with radiculopathy 07/12/2012  . Inability to walk 07/12/2012  . Asymptomatic bacteriuria 07/12/2012  . Meningioma of left sphenoid wing involving cavernous sinus ([place]) 02/17/2012  . Chest pain of uncertain etiology 63/14/9702  . Rheumatoid arthritis ([place]) 01/28/2012  . Primary osteoarthritis of right knee 01/28/2012  . Dyslipidemia   . Thyroid disease   . Essential hypertension     Baruch Merl, PT 12/26/19 1:19 PM   [facility] [address] [place], [place], [address] Phone: [phone]   Fax:  [phone]  Name: [name] MRN: [id number] Date of Birth: March 24, 1941

## 2019-12-27 ENCOUNTER — Ambulatory Visit
Admission: RE | Admit: 2019-12-27 | Discharge: 2019-12-27 | Disposition: A | Discharge status: Home / Self Care | Payer: Medicare Other | Source: Ambulatory Visit | Attending: Physician Assistant | Admitting: Physician Assistant

## 2019-12-27 DIAGNOSIS — M48061 Spinal stenosis, lumbar region without neurogenic claudication: Secondary | ICD-10-CM | POA: Diagnosis not present

## 2019-12-27 DIAGNOSIS — M5416 Radiculopathy, lumbar region: Secondary | ICD-10-CM

## 2019-12-29 ENCOUNTER — Other Ambulatory Visit: Payer: Medicare Other

## 2019-12-30 ENCOUNTER — Telehealth: Payer: Self-pay | Admitting: Physical Medicine and Rehabilitation

## 2019-12-30 NOTE — Telephone Encounter (Signed)
Patient's daughter [name] called requesting to change appt date. Please call [name] back at [phone].

## 2020-01-02 ENCOUNTER — Encounter: Payer: Medicare Other | Admitting: Physical Therapy

## 2020-01-02 NOTE — Telephone Encounter (Signed)
Returned call. No answer, and mailbox is full so unable to leave message.

## 2020-01-03 ENCOUNTER — Ambulatory Visit (INDEPENDENT_AMBULATORY_CARE_PROVIDER_SITE_OTHER): Payer: Medicare Other | Admitting: Physical Medicine and Rehabilitation

## 2020-01-03 ENCOUNTER — Other Ambulatory Visit: Payer: Self-pay

## 2020-01-03 VITALS — BP 132/83 | HR 106

## 2020-01-03 DIAGNOSIS — G894 Chronic pain syndrome: Secondary | ICD-10-CM

## 2020-01-03 DIAGNOSIS — M25552 Pain in left hip: Secondary | ICD-10-CM | POA: Diagnosis not present

## 2020-01-03 DIAGNOSIS — M5116 Intervertebral disc disorders with radiculopathy, lumbar region: Secondary | ICD-10-CM | POA: Diagnosis not present

## 2020-01-03 DIAGNOSIS — M48062 Spinal stenosis, lumbar region with neurogenic claudication: Secondary | ICD-10-CM

## 2020-01-03 NOTE — Telephone Encounter (Signed)
Called patient's daughter again. No answer, mailbox full.

## 2020-01-03 NOTE — Progress Notes (Signed)
Low back pain with left buttock, groin, and thigh pain to knee.  Numeric Pain Rating Scale and Functional Assessment Average Pain 10 Pain Right Now 8 My pain is constant, sharp, dull and aching Pain is worse with: some activites Pain improves with: medication   In the last MONTH (on 0-10 scale) has pain interfered with the following?  1. General activity like being  able to carry out your everyday physical activities such as walking, climbing stairs, carrying groceries, or moving a chair?  Rating(10)  2. Relation with others like being able to carry out your usual social activities and roles such as  activities at home, at work and in your community. Rating(10)  3. Enjoyment of life such that you have  been bothered by emotional problems such as feeling anxious, depressed or irritable?  Rating(10)

## 2020-01-04 ENCOUNTER — Encounter: Payer: Self-pay | Admitting: Physical Medicine and Rehabilitation

## 2020-01-04 ENCOUNTER — Other Ambulatory Visit: Payer: Self-pay

## 2020-01-04 ENCOUNTER — Telehealth: Payer: Self-pay | Admitting: Family Medicine

## 2020-01-04 DIAGNOSIS — E119 Type 2 diabetes mellitus without complications: Secondary | ICD-10-CM

## 2020-01-04 MED ORDER — OZEMPIC (0.25 OR 0.5 MG/DOSE) 2 MG/1.5ML ~~LOC~~ SOPN: 0.5000 mg | PEN_INJECTOR | SUBCUTANEOUS | 2 refills | Status: DC

## 2020-01-04 NOTE — Telephone Encounter (Signed)
Medication sent to pharmacy electronically. No further action needed. Pt notified of update

## 2020-01-04 NOTE — Telephone Encounter (Signed)
Pt is calling in stating that she needs a refill on her Semaglutide 0.25 or 0.5 MG ([place])  Pharm:  [facility]

## 2020-01-04 NOTE — Progress Notes (Signed)
[name] - 79 y.o. female MRN [id number]  Date of birth: July 02, 1940  Office Visit Note: Visit Date: 01/03/2020 PCP: [name], MD Referred by: [name], MD  Subjective: Chief Complaint  Patient presents with   Lower Back - Pain   Left Hip - Pain   HPI: [name] is a 79 y.o. female who comes in today For new patient consultation at the request of Joan Palmer Persons, PA-C for chronic worsening severe left hip and thigh pain.  Patient is present with her daughter who provides some of the history.  She has an extensive medical history that is in the chart and can be reviewed.  In terms of present illness she reports that she has been in chronic pain management under the care of Dr. Alger Simons at the [facility] center for pain management.  She also sees Danella Sensing, FNP.  Her chronic problem has been severe lumbar stenosis at L4-5 for which she was receiving both long-acting and short acting morphine and up until around May was managed with this regimen of medication.  She also was taking Cymbalta as well as a pretty significant dose of gabapentin.  Without specific injury she began having severe left hip and thigh pain.  It had referral into the groin and it was much different.  She reports that medications were increased and changed with no relief.  Interestingly she has not had any injections for lumbar stenosis.  She was deemed to be not a good surgical candidate given all her medical history and background.  Her medical history is complicated by rheumatoid arthritis and diabetes with diabetic neuropathy etc.  Her daughter wanted her to be seen in the orthopedic office and she was seen by Joan Palmer Persons, PA-C who felt like this was not hip related and did get MRI of the lumbar spine.  Patient has a prior history of left intertrochanteric fracture status post pinning and rodding by Dr. Ninfa Mcdaniel in 2018.  That was complicated by infection.  MRI was reviewed  with the patient today and is reviewed below and the reports.  She has a significant disc extrusion that appears to be from the L2-3 disc that extends up and down in the lateral recess at this level causing significant narrowing on the left.  This also shows continued progressive stenosis at L4-5 with facet arthropathy throughout the lumbar spine.  She has not had focal weakness but does have pain with movement of the leg she has pain with trying to stand and move fairly constant pain despite medication treatment.  She was given some prednisone that seemed to help.  She was switched from her morphine to Nucynta.  She reports that that has not really been helping at all.  I actually saw the patient in 2019 at the request of Dr. Ninfa Mcdaniel for facet joint blocks and we were operating on the time with an older MRI showing mainly facet arthropathy with some stenosis at L4-5.  We noted at the time of the facet joint blocks that she may need epidural injection at L4-5 but this was never completed.  Review of Systems  Musculoskeletal: Positive for back pain and joint pain.  All other systems reviewed and are negative.  Otherwise per HPI.  Assessment & Plan: Visit Diagnoses:  1. Radiculopathy due to lumbar intervertebral disc disorder   2. Spinal stenosis of lumbar region with neurogenic claudication   3. Pain in left hip   4. Chronic  pain syndrome     Plan: Findings:  Chronic but more acute onset of left hip and thigh pain sometime around May or June.  She is in chronic pain management for lumbar stenosis and multiple medical issues with rheumatoid arthritis and prior left hip fracture.  Despite her pain management which has been stable this increased around that time and ultimately she was seen in the orthopedic office and MRI of the lumbar spine was performed.  This shows disc herniation extrusion up and down the lateral recess particular around the L2 area but at L1-2 L2-3 and L3-4.  She continues to have  significant stenosis of the L4-5 region chronically.  This does fit with her symptoms classically in terms of the L2 and L1 disc fragment.  I think the best approach is epidural injection at this level from a transforaminal approach.  Depending on how she does with that would look at repeat injection.  Also would recommend at some point injection at the level of stenosis.  Prior facet joint blocks in 2019 were not effective.  MRI at that time was not repeated and no other injections were pursued.  She will continue with chronic pain management with Dr. Charm Barges.  As noted in our last note in 2019 she may seek to have these performed by Dr. Letta Pate in their office in the future.  If she does not get much relief in the way of epidural injection then recommend surgical consultation for discectomy.    Meds & Orders: No orders of the defined types were placed in this encounter.  No orders of the defined types were placed in this encounter.   Follow-up: Return for Left L2 transforaminal steroid and possibly L3.   Procedures: No procedures performed  No notes on file   Clinical History: Low back with bilateral buttock and leg pain for 3 months. No known injury or prior relevant surgery.  EXAM: MRI LUMBAR SPINE WITHOUT CONTRAST  TECHNIQUE: Multiplanar, multisequence MR imaging of the lumbar spine was performed. No intravenous contrast was administered.  COMPARISON:  Radiographs 11/11/2019.  MRI 07/12/2012.  FINDINGS: Segmentation: Conventional anatomy assumed, with the last open disc space designated L5-S1.Concordant with previous imaging.  Alignment: Mildly progressive degenerative anterolisthesis at L4-5, measuring approximately 4 mm. Otherwise normal.  Vertebrae: No worrisome osseous lesion, acute fracture or pars defect. The lumbar pedicles are short on a congenital basis. There are scattered endplate degenerative changes.  Conus medullaris: Extends to the L1 level and  appears normal.  Paraspinal and other soft tissues: There is some atrophy of the erector spinae musculature with nonspecific dependent subcutaneous edema. No significant anterior paraspinal abnormalities.  Disc levels:  T12-L1: Mildly progressive disc bulging eccentric to the left without resulting spinal stenosis or nerve root encroachment. Mild bilateral facet hypertrophy.  L1-2: Progressive loss of disc height with progressive annular disc bulging eccentric to the left. There is a large extruded disc fragment within the left L2 lateral recess extending from the L1-2 disc to the L2-3 disc, favored to arise from the L2-3 level, further described below. There is moderate facet and ligamentous hypertrophy with bilateral facet joint effusions. At the level of the L1-2 disc, there is moderate spinal stenosis with mild asymmetric narrowing of the left lateral recess and left foramen.  L2-3: Progressive loss of disc height with annular disc bulging and anterior endplate degenerative changes. As above, there is a large extruded disc fragment within the left L2 lateral recess, extending from the L1-2 disc to  the L2-3 disc, favored to arise from the L2-3 level. This exerts significant mass effect on the thecal sac and likely results in left-sided nerve root encroachment. There is progressive moderate facet and ligamentous hypertrophy with bilateral facet joint effusions. At the level of the disc, there is moderate foraminal narrowing bilaterally.  L3-4: Mildly progressive loss of disc height with annular disc bulging. Progressive facet and ligamentous hypertrophy with bilateral facet joint effusions. Progressive multifactorial spinal stenosis, now moderate to severe. The foramina appear only mildly narrowed without exiting L3 nerve root encroachment.  L4-5: Similar loss of disc height with annular disc bulging and uncovering related to the degenerative anterolisthesis. There  is advanced bilateral facet hypertrophy. These factors contribute to severe multifactorial spinal stenosis, similar to the previous study. There is moderate narrowing of both lateral recesses. There is mild foraminal narrowing, right greater than left.  L5-S1: Disc height and hydration are relatively maintained. The right facet joint appears ankylosed. No significant spinal stenosis or nerve root encroachment.  IMPRESSION: 1. Large extruded disc fragment within the left L2 lateral recess extending from the L1-2 disc to the L2-3 disc, favored to arise from the L2-3 level. This exerts significant mass effect on the thecal sac and likely results in left-sided nerve root encroachment. 2. Progressive multilevel spondylosis superimposed on a congenitally small spinal canal. There is resulting moderate spinal stenosis at L1-2 and L2-3, moderate to severe spinal stenosis at L3-4 and severe spinal stenosis at L4-5. 3. Progressive facet disease as described. No acute osseous findings.   Electronically Signed   By: Richardean Sale M.D.   On: 12/28/2019 13:45   She reports that she has never smoked. She has never used smokeless tobacco.  Recent Labs    02/02/19 1809 04/22/19 0213 09/22/19 1144  HGBA1C 6.5* 7.4* 7.0*    Objective:  VS:  HT:     WT:    BMI:      BP:132/83   HR:(!) 106bpm   TEMP: ( )   RESP:  Physical Exam Vitals and nursing note reviewed.  Constitutional:      General: She is not in acute distress.    Appearance: Normal appearance. She is well-developed. She is obese. She is not ill-appearing.  HENT:     Head: Normocephalic and atraumatic.  Eyes:     Conjunctiva/sclera: Conjunctivae normal.     Pupils: Pupils are equal, round, and reactive to light.  Cardiovascular:     Rate and Rhythm: Normal rate.     Pulses: Normal pulses.  Pulmonary:     Effort: Pulmonary effort is normal.  Musculoskeletal:     Right lower leg: No edema.     Left lower leg: No edema.      Comments: Patient sitting in wheelchair due to pain level of standing and moving.  She actually has good strength throughout both lower extremities including hip flexion knee flexion extension and dorsiflexion plantar flexion EHL.  She is reluctant to move the hip although when I do range her hip there is no pain with internal rotation.  She gets some pain trying to extend the hip.  Skin:    General: Skin is warm and dry.     Findings: No erythema or rash.  Neurological:     General: No focal deficit present.     Mental Status: She is alert and oriented to person, place, and time.     Sensory: No sensory deficit.     Motor: No abnormal muscle tone.  Coordination: Coordination normal.     Gait: Gait normal.  Psychiatric:        Mood and Affect: Mood normal.        Behavior: Behavior normal.     Ortho Exam  Imaging: No results found.  Past Medical/Family/Surgical/Social History: Medications & Allergies reviewed per EMR, new medications updated. Patient Active Problem List   Diagnosis Date Noted   Diabetic neuropathy ([place]) 05/24/2019   Sepsis secondary to UTI ([place]) 04/25/2019   Cellulitis in diabetic foot ([place]) 04/25/2019   Hypokalemia 04/21/2019   Anticoagulated 06/25/2018   Chronic pain 06/25/2018   CRI (chronic renal insufficiency), stage 3 (moderate) 06/25/2018   Drug-induced constipation 06/18/2018   DVT (deep venous thrombosis) ([place]) 06/03/2018   Syncope and collapse 06/02/2018   Chest pain 06/02/2018   Primary osteoarthritis of both knees 05/26/2018   Body mass index (BMI) of 50-59.9 in adult [facility]) 05/26/2018   Acquired hypothyroidism 05/26/2018   Chronic bilateral low back pain without sciatica 02/17/2018   Astigmatism with presbyopia, bilateral 03/26/2017   Cortical age-related cataract of both eyes 03/26/2017   Family history of glaucoma 03/26/2017   Long term current use of oral hypoglycemic drug 03/26/2017   Nuclear sclerotic cataract  of both eyes 03/26/2017   Pain in left hip 02/18/2017   Type 2 diabetes mellitus ([place]) 01/16/2017   Closed nondisplaced intertrochanteric fracture of left femur with delayed healing 01/14/2017   Postoperative wound infection 01/14/2017   Gout 11/29/2016   Depression 11/29/2016   Closed left hip fracture ([place]) 11/29/2016   GERD (gastroesophageal reflux disease) 11/29/2016   Closed intertrochanteric fracture of hip, left, initial encounter ([place]) 11/29/2016   Physical deconditioning 07/31/2015   Pulmonary fibrosis, postinflammatory ([place]) 07/31/2015   Bronchiectasis without complication ([place]) 17/40/8144   Tendinitis of left rotator cuff 06/13/2015   Acute on chronic diastolic heart failure ([place]) 04/21/2015   Acute kidney injury superimposed on chronic kidney disease ([place]) 04/17/2015   Hypotension 04/17/2015   Chronic respiratory failure with hypoxia ([place]) 02/09/2015   Dyspnea and respiratory abnormality 01/03/2015   Normal coronary arteries 11/02/2014   Morbid obesity ([place]) 10/30/2014   Dysphagia 08/21/2014   Chronic fatigue 06/21/2014   DOE (dyspnea on exertion) 05/24/2014   Primary osteoarthritis of left knee 04/26/2014   High risk medication use 04/17/2014   Aphasia 02/12/2014   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 ([place]) 01/18/2014   Lumbar facet arthropathy 01/18/2014   Bilateral edema of lower extremity 05/30/2013   Chronic cough 05/24/2013   ILD (interstitial lung disease) ([place]) 04/10/2013   Greater trochanteric bursitis of left hip 08/13/2012   Spinal stenosis of lumbar region with radiculopathy 07/12/2012   Inability to walk 07/12/2012   Asymptomatic bacteriuria 07/12/2012   Meningioma of left sphenoid wing involving cavernous sinus ([place]) 02/17/2012   Chest pain of uncertain etiology 81/85/6314   Rheumatoid arthritis ([place]) 01/28/2012   Primary osteoarthritis of right knee 01/28/2012   Dyslipidemia    Thyroid disease     Essential hypertension    Past Medical History:  Diagnosis Date   Acute on chronic diastolic (congestive) heart failure (HCC)    Anemia    iron deficiency anemia - secondary to blood loss ( chronic)    Arthritis    endstage changes bilateral knees/bilateral ankles.    Asthma    Carotid artery occlusion    Chronic fatigue    Chronic kidney disease    Closed left hip fracture (HCC)    Clotting disorder ([place])  pt denies this   Contusion of left knee    due to fall 1/14.   COPD (chronic obstructive pulmonary disease) (HCC)    pulmonary fibrosis   Depression, reactive    Diabetes mellitus    type II    Diastolic dysfunction    Difficulty in walking    Family history of heart disease    Generalized muscle weakness    Gout    High cholesterol    History of falling    Hypertension    Hypothyroidism    Interstitial lung disease ([employer])    Meningioma of left sphenoid wing involving cavernous sinus ([place]) 02/17/2012   Continue diplopia, left eye pain and left headaches.     Morbid obesity ([place])    Morbid obesity ([place])    Neuromuscular disorder ([name])    diabetic neuropathy    Normal coronary arteries    cardiac catheterization performed  10/31/14   RA (rheumatoid arthritis) ([place])    has been off methotreaxte since 10/13.   Rheumatoid arthritis (HCC)    Shortness of breath    Spinal stenosis of lumbar region    Thyroid disease    Unspecified lack of coordination    URI (upper respiratory infection)    Family History  Problem Relation Age of Onset   Diabetes Mother    Heart attack Mother    Hypertension Father    Lung cancer Father    Diabetes Sister    Diabetes Brother    Hypertension Brother    Heart disease Brother    Heart attack Brother    Kidney cancer Brother    Uterine cancer Daughter    Breast cancer Sister    Rheum arthritis Maternal Uncle    Gout Brother    Kidney failure Brother        x 5   Past  Surgical History:  Procedure Laterality Date   ABDOMINAL HYSTERECTOMY     BRAIN SURGERY     Gamma knife 10/13. Needs repeat spring  '14   CARDIAC CATHETERIZATION N/A 10/31/2014   Procedure: Right/Left Heart Cath and Coronary Angiography;  Surgeon: [name], MD;  Location: [facility];  Service: Cardiovascular;  Laterality: N/A;   ESOPHAGOGASTRODUODENOSCOPY (EGD) WITH PROPOFOL N/A 09/14/2014   Procedure: ESOPHAGOGASTRODUODENOSCOPY (EGD) WITH PROPOFOL;  Surgeon: [name], MD;  Location: [facility];  Service: Endoscopy;  Laterality: N/A;   INCISION AND DRAINAGE HIP Left 01/16/2017   Procedure: IRRIGATION AND DEBRIDEMENT LEFT HIP;  Surgeon: [name], MD;  Location: [facility];  Service: Orthopedics;  Laterality: Left;   INTRAMEDULLARY (IM) NAIL INTERTROCHANTERIC Left 11/29/2016   Procedure: INTRAMEDULLARY (IM) NAIL INTERTROCHANTRIC;  Surgeon: [name], MD;  Location: [facility];  Service: Orthopedics;  Laterality: Left;   OVARY SURGERY     SHOULDER SURGERY Left    TONSILLECTOMY  age 43   VIDEO BRONCHOSCOPY Bilateral 05/31/2013   Procedure: VIDEO BRONCHOSCOPY WITHOUT FLUORO;  Surgeon: Brand Males, MD;  Location: [facility];  Service: Cardiopulmonary;  Laterality: Bilateral;   video bronscoscopy  2000   lung   Social History   Occupational History   Occupation: retired Marine scientist  Tobacco Use   Smoking status: Never Smoker   Smokeless tobacco: Never Used  Scientific laboratory technician Use: Never used  Substance and Sexual Activity   Alcohol use: No   Drug use: No   Sexual activity: Not Currently

## 2020-01-05 ENCOUNTER — Ambulatory Visit: Payer: Medicare Other | Admitting: Orthopedic Surgery

## 2020-01-05 ENCOUNTER — Other Ambulatory Visit: Payer: Self-pay

## 2020-01-05 ENCOUNTER — Encounter: Payer: Self-pay | Admitting: Physical Medicine and Rehabilitation

## 2020-01-05 ENCOUNTER — Ambulatory Visit (INDEPENDENT_AMBULATORY_CARE_PROVIDER_SITE_OTHER): Payer: Medicare Other | Admitting: Physical Medicine and Rehabilitation

## 2020-01-05 ENCOUNTER — Ambulatory Visit: Payer: Self-pay

## 2020-01-05 VITALS — BP 143/87 | HR 103

## 2020-01-05 DIAGNOSIS — M5116 Intervertebral disc disorders with radiculopathy, lumbar region: Secondary | ICD-10-CM

## 2020-01-05 DIAGNOSIS — M48062 Spinal stenosis, lumbar region with neurogenic claudication: Secondary | ICD-10-CM | POA: Diagnosis not present

## 2020-01-05 MED ORDER — DEXAMETHASONE SODIUM PHOSPHATE 10 MG/ML IJ SOLN
15.0000 mg | Freq: Once | INTRAMUSCULAR | Status: AC
Start: 2020-01-05 — End: 2020-01-05
  Administered 2020-01-05: 15 mg

## 2020-01-05 NOTE — Progress Notes (Signed)
Pt states lower back pain that travels to her left leg and stops at her knee. Pt states moving her body and walking makes worse. Pt states icing and pain meds helps a little.  Numeric Pain Rating Scale and Functional Assessment Average Pain 9   In the last MONTH (on 0-10 scale) has pain interfered with the following?  1. General activity like being  able to carry out your everyday physical activities such as walking, climbing stairs, carrying groceries, or moving a chair?  Rating(10)   +Driver, -BT, -Dye Allergies.

## 2020-01-06 NOTE — Progress Notes (Signed)
Miamarie MELISIA LEMING - 79 y.o. female MRN [id number]  Date of birth: Mar 05, 1941  Office Visit Note: Visit Date: 01/05/2020 PCP: [name], MD Referred by: [name], MD  Subjective: Chief Complaint  Patient presents with  . Lower Back - Pain  . Left Knee - Pain   HPI: Amariz F Dulski is a 79 y.o. female who comes in today at the request of Dr. Laurence Spates for planned Left L2-L3, L3-L4 Lumbar epidural steroid injection with fluoroscopic guidance.  The patient has failed conservative care including home exercise, medications, time and activity modification.  This injection will be diagnostic and hopefully therapeutic.  Please see requesting physician notes for further details and justification.  Referring Dr.: Meridee Score, MD and patient also sees Jean Rosenthal, MD   ROS Otherwise per HPI.  Assessment & Plan: Visit Diagnoses:  1. Radiculopathy due to lumbar intervertebral disc disorder   2. Spinal stenosis of lumbar region with neurogenic claudication     Plan: No additional findings.   Meds & Orders:  Meds ordered this encounter  Medications  . dexamethasone (DECADRON) injection 15 mg    Orders Placed This Encounter  Procedures  . XR C-ARM NO REPORT  . Epidural Steroid injection    Follow-up: Return if symptoms worsen or fail to improve.   Procedures: No procedures performed  Lumbosacral Transforaminal Epidural Steroid Injection - Sub-Pedicular Approach with Fluoroscopic Guidance  Patient: Kaitlyn AYDA TANCREDI      Date of Birth: Mar 30, 1941 MRN: [id number] PCP: [name], MD      Visit Date: 01/05/2020   Universal Protocol:    Date/Time: 01/05/2020  Consent Given By: the patient  Position: PRONE  Additional Comments: Vital signs were monitored before and after the procedure. Patient was prepped and draped in the usual sterile fashion. The correct patient, procedure, and site was verified.   Injection Procedure Details:  Procedure  Site One Meds Administered:  Meds ordered this encounter  Medications  . dexamethasone (DECADRON) injection 15 mg    Laterality: Left  Location/Site:  L2-L3 L3-L4  Needle size: 22 G  Needle type: Spinal  Needle Placement: Transforaminal  Findings:    -Comments: Excellent flow of contrast along the nerve, nerve root and into the epidural space.  Procedure Details: After squaring off the end-plates to get a true AP view, the C-arm was positioned so that an oblique view of the foramen as noted above was visualized. The target area is just inferior to the "nose of the scotty dog" or sub pedicular. The soft tissues overlying this structure were infiltrated with 2-3 ml. of 1% Lidocaine without Epinephrine.  The spinal needle was inserted toward the target using a "trajectory" view along the fluoroscope beam.  Under AP and lateral visualization, the needle was advanced so it did not puncture dura and was located close the 6 O'Clock position of the pedical in AP tracterory. Biplanar projections were used to confirm position. Aspiration was confirmed to be negative for CSF and/or blood. A 1-2 ml. volume of Isovue-250 was injected and flow of contrast was noted at each level. Radiographs were obtained for documentation purposes.   After attaining the desired flow of contrast documented above, a 0.5 to 1.0 ml test dose of 0.25% Marcaine was injected into each respective transforaminal space.  The patient was observed for 90 seconds post injection.  After no sensory deficits were reported, and normal lower extremity motor function was noted,   the above injectate was administered  so that equal amounts of the injectate were placed at each foramen (level) into the transforaminal epidural space.   Additional Comments:  The patient tolerated the procedure well Dressing: 2 x 2 sterile gauze and Band-Aid    Post-procedure details: Patient was observed during the procedure. Post-procedure  instructions were reviewed.  Patient left the clinic in stable condition.      Clinical History: Low back with bilateral buttock and leg pain for 3 months. No known injury or prior relevant surgery.  EXAM: MRI LUMBAR SPINE WITHOUT CONTRAST  TECHNIQUE: Multiplanar, multisequence MR imaging of the lumbar spine was performed. No intravenous contrast was administered.  COMPARISON:  Radiographs 11/11/2019.  MRI 07/12/2012.  FINDINGS: Segmentation: Conventional anatomy assumed, with the last open disc space designated L5-S1.Concordant with previous imaging.  Alignment: Mildly progressive degenerative anterolisthesis at L4-5, measuring approximately 4 mm. Otherwise normal.  Vertebrae: No worrisome osseous lesion, acute fracture or pars defect. The lumbar pedicles are short on a congenital basis. There are scattered endplate degenerative changes.  Conus medullaris: Extends to the L1 level and appears normal.  Paraspinal and other soft tissues: There is some atrophy of the erector spinae musculature with nonspecific dependent subcutaneous edema. No significant anterior paraspinal abnormalities.  Disc levels:  T12-L1: Mildly progressive disc bulging eccentric to the left without resulting spinal stenosis or nerve root encroachment. Mild bilateral facet hypertrophy.  L1-2: Progressive loss of disc height with progressive annular disc bulging eccentric to the left. There is a large extruded disc fragment within the left L2 lateral recess extending from the L1-2 disc to the L2-3 disc, favored to arise from the L2-3 level, further described below. There is moderate facet and ligamentous hypertrophy with bilateral facet joint effusions. At the level of the L1-2 disc, there is moderate spinal stenosis with mild asymmetric narrowing of the left lateral recess and left foramen.  L2-3: Progressive loss of disc height with annular disc bulging and anterior endplate  degenerative changes. As above, there is a large extruded disc fragment within the left L2 lateral recess, extending from the L1-2 disc to the L2-3 disc, favored to arise from the L2-3 level. This exerts significant mass effect on the thecal sac and likely results in left-sided nerve root encroachment. There is progressive moderate facet and ligamentous hypertrophy with bilateral facet joint effusions. At the level of the disc, there is moderate foraminal narrowing bilaterally.  L3-4: Mildly progressive loss of disc height with annular disc bulging. Progressive facet and ligamentous hypertrophy with bilateral facet joint effusions. Progressive multifactorial spinal stenosis, now moderate to severe. The foramina appear only mildly narrowed without exiting L3 nerve root encroachment.  L4-5: Similar loss of disc height with annular disc bulging and uncovering related to the degenerative anterolisthesis. There is advanced bilateral facet hypertrophy. These factors contribute to severe multifactorial spinal stenosis, similar to the previous study. There is moderate narrowing of both lateral recesses. There is mild foraminal narrowing, right greater than left.  L5-S1: Disc height and hydration are relatively maintained. The right facet joint appears ankylosed. No significant spinal stenosis or nerve root encroachment.  IMPRESSION: 1. Large extruded disc fragment within the left L2 lateral recess extending from the L1-2 disc to the L2-3 disc, favored to arise from the L2-3 level. This exerts significant mass effect on the thecal sac and likely results in left-sided nerve root encroachment. 2. Progressive multilevel spondylosis superimposed on a congenitally small spinal canal. There is resulting moderate spinal stenosis at L1-2 and L2-3, moderate to severe spinal  stenosis at L3-4 and severe spinal stenosis at L4-5. 3. Progressive facet disease as described. No acute  osseous findings.   Electronically Signed   By: Richardean Sale M.D.   On: 12/28/2019 13:45   She reports that she has never smoked. She has never used smokeless tobacco.  Recent Labs    02/02/19 1809 04/22/19 0213 09/22/19 1144  HGBA1C 6.5* 7.4* 7.0*    Objective:  VS:  HT:    WT:   BMI:     BP:(!) 143/87  HR:(!) 103bpm  TEMP: ( )  RESP:  Physical Exam Constitutional:      General: She is not in acute distress.    Appearance: Normal appearance. She is obese. She is not ill-appearing.  HENT:     Head: Normocephalic and atraumatic.     Right Ear: External ear normal.     Left Ear: External ear normal.  Eyes:     Extraocular Movements: Extraocular movements intact.  Cardiovascular:     Rate and Rhythm: Normal rate.     Pulses: Normal pulses.  Musculoskeletal:     Right lower leg: No edema.     Left lower leg: No edema.     Comments: Patient has good distal strength with no pain over the greater trochanters.  No clonus or focal weakness.  Skin:    Findings: No erythema, lesion or rash.  Neurological:     General: No focal deficit present.     Mental Status: She is alert and oriented to person, place, and time.     Sensory: No sensory deficit.     Motor: No weakness or abnormal muscle tone.     Coordination: Coordination normal.  Psychiatric:        Mood and Affect: Mood normal.        Behavior: Behavior normal.     Ortho Exam  Imaging: XR C-ARM NO REPORT  Result Date: 01/05/2020 Please see Notes tab for imaging impression.   Past Medical/Family/Surgical/Social History: Medications & Allergies reviewed per EMR, new medications updated. Patient Active Problem List   Diagnosis Date Noted  . Diabetic neuropathy ([place]) 05/24/2019  . Sepsis secondary to UTI ([place]) 04/25/2019  . Cellulitis in diabetic foot ([place]) 04/25/2019  . Hypokalemia 04/21/2019  . Anticoagulated 06/25/2018  . Chronic pain 06/25/2018  . CRI (chronic renal insufficiency), stage 3  (moderate) 06/25/2018  . Drug-induced constipation 06/18/2018  . DVT (deep venous thrombosis) ([place]) 06/03/2018  . Syncope and collapse 06/02/2018  . Chest pain 06/02/2018  . Primary osteoarthritis of both knees 05/26/2018  . Body mass index (BMI) of 50-59.9 in adult ([place]) 05/26/2018  . Acquired hypothyroidism 05/26/2018  . Chronic bilateral low back pain without sciatica 02/17/2018  . Astigmatism with presbyopia, bilateral 03/26/2017  . Cortical age-related cataract of both eyes 03/26/2017  . Family history of glaucoma 03/26/2017  . Long term current use of oral hypoglycemic drug 03/26/2017  . Nuclear sclerotic cataract of both eyes 03/26/2017  . Pain in left hip 02/18/2017  . Type 2 diabetes mellitus ([place]) 01/16/2017  . Closed nondisplaced intertrochanteric fracture of left femur with delayed healing 01/14/2017  . Postoperative wound infection 01/14/2017  . Gout 11/29/2016  . Depression 11/29/2016  . Closed left hip fracture ([place]) 11/29/2016  . GERD (gastroesophageal reflux disease) 11/29/2016  . Closed intertrochanteric fracture of hip, left, initial encounter ([place]) 11/29/2016  . Physical deconditioning 07/31/2015  . Pulmonary fibrosis, postinflammatory ([place]) 07/31/2015  . Bronchiectasis without complication ([place]) 34/74/2595  . Tendinitis  of left rotator cuff 06/13/2015  . Acute on chronic diastolic heart failure ([place]) 04/21/2015  . Acute kidney injury superimposed on chronic kidney disease ([facility]) 04/17/2015  . Hypotension 04/17/2015  . Chronic respiratory failure with hypoxia ([place]) 02/09/2015  . Dyspnea and respiratory abnormality 01/03/2015  . Normal coronary arteries 11/02/2014  . Morbid obesity ([place]) 10/30/2014  . Dysphagia 08/21/2014  . Chronic fatigue 06/21/2014  . DOE (dyspnea on exertion) 05/24/2014  . Primary osteoarthritis of left knee 04/26/2014  . High risk medication use 04/17/2014  . Aphasia 02/12/2014  . 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 ([place])  01/18/2014  . Lumbar facet arthropathy 01/18/2014  . Bilateral edema of lower extremity 05/30/2013  . Chronic cough 05/24/2013  . ILD (interstitial lung disease) ([place]) 04/10/2013  . Greater trochanteric bursitis of left hip 08/13/2012  . Spinal stenosis of lumbar region with radiculopathy 07/12/2012  . Inability to walk 07/12/2012  . Asymptomatic bacteriuria 07/12/2012  . Meningioma of left sphenoid wing involving cavernous sinus ([place]) 02/17/2012  . Chest pain of uncertain etiology 08/65/7846  . Rheumatoid arthritis ([place]) 01/28/2012  . Primary osteoarthritis of right knee 01/28/2012  . Dyslipidemia   . Thyroid disease   . Essential hypertension    Past Medical History:  Diagnosis Date  . Acute on chronic diastolic (congestive) heart failure ([place])   . Anemia    iron deficiency anemia - secondary to blood loss ( chronic)   . Arthritis    endstage changes bilateral knees/bilateral ankles.   . Asthma   . Carotid artery occlusion   . Chronic fatigue   . Chronic kidney disease   . Closed left hip fracture ([place])   . Clotting disorder ([place])    pt denies this  . Contusion of left knee    due to fall 1/14.  [name] COPD (chronic obstructive pulmonary disease) ([facility])    pulmonary fibrosis  . Depression, reactive   . Diabetes mellitus    type II   . Diastolic dysfunction   . Difficulty in walking   . Family history of heart disease   . Generalized muscle weakness   . Gout   . High cholesterol   . History of falling   . Hypertension   . Hypothyroidism   . Interstitial lung disease ([place])   . 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    . Morbid obesity ([place])   . Morbid obesity ([place])   . Neuromuscular disorder ([place])    diabetic neuropathy   . Normal coronary arteries    cardiac catheterization performed  10/31/14  . RA (rheumatoid arthritis) ([place])    has been off methotreaxte since 10/13.  [name]  Rheumatoid arthritis ([place])   . Shortness of breath   . Spinal stenosis of lumbar region   . Thyroid disease   . Unspecified lack of coordination   . URI (upper respiratory infection)    Family History  Problem Relation Age of Onset  . Diabetes Mother   . Heart attack Mother   . Hypertension Father   . Lung cancer Father   . Diabetes Sister   . Diabetes Brother   . Hypertension Brother   . Heart disease Brother   . Heart attack Brother   . Kidney cancer Brother   . Uterine cancer Daughter   . Breast cancer Sister   . Rheum arthritis Maternal Uncle   . Gout Brother   .  Kidney failure Brother        x 5   Past Surgical History:  Procedure Laterality Date  . ABDOMINAL HYSTERECTOMY    . BRAIN SURGERY     Gamma knife 10/13. Needs repeat spring  '14  . CARDIAC CATHETERIZATION N/A 10/31/2014   Procedure: Right/Left Heart Cath and Coronary Angiography;  Surgeon: [name], MD;  Location: [facility];  Service: Cardiovascular;  Laterality: N/A;  . ESOPHAGOGASTRODUODENOSCOPY (EGD) WITH PROPOFOL N/A 09/14/2014   Procedure: ESOPHAGOGASTRODUODENOSCOPY (EGD) WITH PROPOFOL;  Surgeon: [name], MD;  Location: [facility];  Service: Endoscopy;  Laterality: N/A;  . INCISION AND DRAINAGE HIP Left 01/16/2017   Procedure: IRRIGATION AND DEBRIDEMENT LEFT HIP;  Surgeon: [name], MD;  Location: [facility];  Service: Orthopedics;  Laterality: Left;  . INTRAMEDULLARY (IM) NAIL INTERTROCHANTERIC Left 11/29/2016   Procedure: INTRAMEDULLARY (IM) NAIL INTERTROCHANTRIC;  Surgeon: [name], MD;  Location: [facility];  Service: Orthopedics;  Laterality: Left;  . OVARY SURGERY    . SHOULDER SURGERY Left   . TONSILLECTOMY  age 89  . VIDEO BRONCHOSCOPY Bilateral 05/31/2013   Procedure: VIDEO BRONCHOSCOPY WITHOUT FLUORO;  Surgeon: Brand Males, MD;  Location: [facility];  Service: Cardiopulmonary;  Laterality: Bilateral;  . video bronscoscopy  2000   lung    Social History   Occupational History  . Occupation: retired Marine scientist  Tobacco Use  . Smoking status: Never Smoker  . Smokeless tobacco: Never Used  Vaping Use  . Vaping Use: Never used  Substance and Sexual Activity  . Alcohol use: No  . Drug use: No  . Sexual activity: Not Currently

## 2020-01-06 NOTE — Procedures (Signed)
Lumbosacral Transforaminal Epidural Steroid Injection - Sub-Pedicular Approach with Fluoroscopic Guidance  Patient: [name]      Date of Birth: Sep 17, 1940 MRN: [id number] PCP: [name], MD      Visit Date: 01/05/2020   Universal Protocol:    Date/Time: 01/05/2020  Consent Given By: the patient  Position: PRONE  Additional Comments: Vital signs were monitored before and after the procedure. Patient was prepped and draped in the usual sterile fashion. The correct patient, procedure, and site was verified.   Injection Procedure Details:  Procedure Site One Meds Administered:  Meds ordered this encounter  Medications  . dexamethasone (DECADRON) injection 15 mg    Laterality: Left  Location/Site:  L2-L3 L3-L4  Needle size: 22 G  Needle type: Spinal  Needle Placement: Transforaminal  Findings:    -Comments: Excellent flow of contrast along the nerve, nerve root and into the epidural space.  Procedure Details: After squaring off the end-plates to get a true AP view, the C-arm was positioned so that an oblique view of the foramen as noted above was visualized. The target area is just inferior to the "nose of the scotty dog" or sub pedicular. The soft tissues overlying this structure were infiltrated with 2-3 ml. of 1% Lidocaine without Epinephrine.  The spinal needle was inserted toward the target using a "trajectory" view along the fluoroscope beam.  Under AP and lateral visualization, the needle was advanced so it did not puncture dura and was located close the 6 O'Clock position of the pedical in AP tracterory. Biplanar projections were used to confirm position. Aspiration was confirmed to be negative for CSF and/or blood. A 1-2 ml. volume of Isovue-250 was injected and flow of contrast was noted at each level. Radiographs were obtained for documentation purposes.   After attaining the desired flow of contrast documented above, a 0.5 to 1.0 ml test dose of  0.25% Marcaine was injected into each respective transforaminal space.  The patient was observed for 90 seconds post injection.  After no sensory deficits were reported, and normal lower extremity motor function was noted,   the above injectate was administered so that equal amounts of the injectate were placed at each foramen (level) into the transforaminal epidural space.   Additional Comments:  The patient tolerated the procedure well Dressing: 2 x 2 sterile gauze and Band-Aid    Post-procedure details: Patient was observed during the procedure. Post-procedure instructions were reviewed.  Patient left the clinic in stable condition.

## 2020-01-09 ENCOUNTER — Encounter: Payer: Medicare Other | Admitting: Physical Therapy

## 2020-01-09 DIAGNOSIS — J479 Bronchiectasis, uncomplicated: Secondary | ICD-10-CM | POA: Diagnosis not present

## 2020-01-09 DIAGNOSIS — R9431 Abnormal electrocardiogram [ECG] [EKG]: Secondary | ICD-10-CM | POA: Diagnosis not present

## 2020-01-09 DIAGNOSIS — Z6841 Body Mass Index (BMI) 40.0 and over, adult: Secondary | ICD-10-CM | POA: Diagnosis not present

## 2020-01-09 DIAGNOSIS — M0579 Rheumatoid arthritis with rheumatoid factor of multiple sites without organ or systems involvement: Secondary | ICD-10-CM | POA: Diagnosis not present

## 2020-01-11 ENCOUNTER — Other Ambulatory Visit: Payer: Self-pay

## 2020-01-11 ENCOUNTER — Ambulatory Visit (INDEPENDENT_AMBULATORY_CARE_PROVIDER_SITE_OTHER): Payer: Medicare Other | Admitting: Cardiovascular Disease

## 2020-01-11 ENCOUNTER — Encounter: Payer: Self-pay | Admitting: Cardiovascular Disease

## 2020-01-11 DIAGNOSIS — R600 Localized edema: Secondary | ICD-10-CM | POA: Diagnosis not present

## 2020-01-11 DIAGNOSIS — I10 Essential (primary) hypertension: Secondary | ICD-10-CM

## 2020-01-11 NOTE — Assessment & Plan Note (Signed)
History of hypertension with blood pressure measured today 129/87.  Her blood pressure has been under better control.  She is on metoprolol and Benicar.

## 2020-01-11 NOTE — Progress Notes (Signed)
01/11/2020 [name]   [birth year]  [id number]  Primary Physician [name], MD Primary Cardiologist: Lorretta Harp MD [name], [place]  HPI:  [name] is a 79 y.o.  moderately overweight married African American female mother of 6 children, grandmother of a grandchildren referred by [name] for cardiovascular evaluation because of progressive dyspnea on exertion and chest pain. I last saw her in the office 10/13/2019.  She is accompanied by her daughter [name] today..She did see Kerin Ransom, PA-C in the office 11/16/2019.[name] Her cardiovascular risk factor are notable for a strong family history of heart disease with her mother and 49 brothers old with an ischemic heart disease. She has never had a heart attack or stroke. She does have interstitial lung disease as well as rheumatoid arthritis. She is on home oxygen. She also has a brain tumor/meningioma 2 with gamma knife therapy She's had progressive disabling dyspnea as well as daily chest pain. She underwent cardiac catheterization 10/31/14 by Dr. Irish Lack revealing normal coronary arteries and normal LV function. She did have mild pulmonary hypertension. 2-D echo showed normal LV function with grade 1 diastolic dysfunction and mild pulmonary hypertension. Her symptoms really have not changed on by mouth diuretics. She was referred to [facility] for further evaluation and treatment of interstitial lung disease. Since beginning an antibiotic and an inhaled bronchodilator her shortness of breath is somewhat improved.  She had an episode of syncope prior to seeing Kerin Ransom thought to be related to overmedication and dehydration from poor p.o. intake. Her meds were adjusted. She was also hospitalized last month with sepsis related to a tooth infection with and was in the hospital for 10 days. She had fluid overload and has since diuresed. She gets occasional atypical chest pain. Her  blood pressure is mildly elevated when she takes it at home. She is on furosemide 60 mg a day.  She had been seeing one of our our Pharm.D. for medication titration for hypertension which is under better control.    She had noticed increasing weight gain lower extremity edema as well as some atypical chest pain and left flank pain.  She did see her nephrologist who put her on antibiotic for presumed UTI and increased her diuretic from furosemide 60 to 80 mg a day.  Her symptoms sound like she has an "upper urinary tract infection" which may have exacerbated her diastolic heart failure.  Her nephrologist is following her volume status and renal function closely.  Since I saw her 3 months ago she is lost 18 pounds probably related to water loss from increased diuretics.  She does feel clinically improved.  Breathing is better.  She still has atypical chest heaviness 5 reassured her based on her prior normal cath.  Her major issue is her back which recently has been treated.  She was in chronic pain.  She is scheduled to start rehab.   Current Meds  Medication Sig  . acetaminophen (TYLENOL) 500 MG tablet Take 1,000 mg by mouth every 6 (six) hours as needed for moderate pain.   [name] albuterol (PROVENTIL HFA;VENTOLIN HFA) 108 (90 BASE) MCG/ACT inhaler Inhale 2 puffs into the lungs every 6 (six) hours as needed for wheezing or shortness of breath.  . allopurinol (ZYLOPRIM) 300 MG tablet Take 300 mg by mouth daily.  [name] aspirin buffered (BUFFERIN) 325 MG TABS tablet Take 325 mg by mouth daily.  [name] atorvastatin (LIPITOR) 20 MG tablet Take  10 mg by mouth every evening.   [name] azithromycin (ZITHROMAX) 250 MG tablet Take by mouth daily.  . Calcium Carb-Cholecalciferol (CALCIUM + D3 PO) Take 2 tablets by mouth daily with lunch.   . cholecalciferol (VITAMIN D) 1000 UNITS tablet Take 2,000 Units by mouth daily with lunch.   . colchicine 0.6 MG tablet Take 0.6 mg by mouth daily.  . cyclobenzaprine (FLEXERIL) 5 MG tablet  Take 1 tablet (5 mg total) by mouth at bedtime as needed for muscle spasms.  . diclofenac sodium (VOLTAREN) 1 % GEL Apply 2 g topically 3 (three) times daily. Both hands and knees  . DULoxetine (CYMBALTA) 60 MG capsule Take 1 capsule (60 mg total) by mouth daily.  . fluticasone (FLONASE) 50 MCG/ACT nasal spray Place 2 sprays into both nostrils daily.  . furosemide (LASIX) 80 MG tablet Take 80 mg by mouth.  . gabapentin (NEURONTIN) 600 MG tablet Take two in the morning , Two mid- afternoon and two at bedtime (Patient taking differently: Take 1,200 mg by mouth 3 (three) times daily. Take two in the morning , Two mid- afternoon and two at bedtime)  . guaiFENesin (MUCINEX) 600 MG 12 hr tablet Take 1,200 mg by mouth 2 (two) times daily.  . hydroxychloroquine (PLAQUENIL) 200 MG tablet Take 400 mg by mouth daily.   [name] ipratropium-albuterol (DUONEB) 0.5-2.5 (3) MG/3ML SOLN Take 3 mLs by nebulization every 6 (six) hours as needed (cough/wheezing).   [name] leflunomide (ARAVA) 10 MG tablet Take 10 mg by mouth daily.  [name] levothyroxine (SYNTHROID) 50 MCG tablet Take 1 tablet (50 mcg total) by mouth daily.  [name] linaclotide (LINZESS) 72 MCG capsule Take 1 capsule (72 mcg total) by mouth daily before breakfast.  . metolazone (ZAROXOLYN) 2.5 MG tablet TAKE 1 TABLET BY MOUTH DAILY AS NEEDED FOR 2 DAYS FOR WEIGHT ABOVE 255 POUNDS  . metoprolol succinate (TOPROL XL) 25 MG 24 hr tablet Take 3 tablets (75 mg total) by mouth daily.  . Multiple Vitamin (MULTIVITAMIN WITH MINERALS) TABS Take 1 tablet by mouth daily.   [name] olmesartan (BENICAR) 40 MG tablet Take 1 tablet (40 mg total) by mouth daily.  . OXYGEN-HELIUM IN Inhale 2 L into the lungs at bedtime. And on exertion  . pantoprazole (PROTONIX) 40 MG tablet take 1 tablet by mouth once daily MINUTES BEFORE 1ST MEAL OF THE DAY  . potassium chloride SA (KLOR-CON) 20 MEQ tablet Take 1 tablet (20 mEq total) by mouth daily.  . predniSONE (DELTASONE) 5 MG tablet Take 10 mg by mouth  every morning.   . Semaglutide,0.25 or 0.5MG/DOS, (OZEMPIC, 0.25 OR 0.5 MG/DOSE,) 2 MG/1.5ML SOPN Inject 0.375 mLs (0.5 mg total) into the skin once a week.  . sodium chloride (OCEAN) 0.65 % SOLN nasal spray Place 1 spray into both nostrils as needed for congestion.  [name] Spacer/Aero-Holding Chambers (AEROCHAMBER MV) inhaler Use as instructed  . tapentadol (NUCYNTA) 50 MG tablet Take 1 tablet (50 mg total) by mouth every 6 (six) hours as needed.     Allergies  Allergen Reactions  . Codeine Swelling and Other (See Comments)    Facial swelling Chest pain and swelling in legs  Facial swelling  . Infliximab Anaphylaxis    "sent me into shock" "sent me into shock"  . Lisinopril Swelling    Face and neck swelling Face and neck swelling    Social History   Socioeconomic History  . Marital status: Married    Spouse name: Not on file  . Number  of children: 6  . Years of education: college  . Highest education level: Not on file  Occupational History  . Occupation: retired Marine scientist  Tobacco Use  . Smoking status: Never Smoker  . Smokeless tobacco: Never Used  Vaping Use  . Vaping Use: Never used  Substance and Sexual Activity  . Alcohol use: No  . Drug use: No  . Sexual activity: Not Currently  Other Topics Concern  . Not on file  Social History Narrative   Patient consumes 2-3 cups coffee per day.   Social Determinants of Health   Financial Resource Strain:   . Difficulty of Paying Living Expenses: Not on file  Food Insecurity:   . Worried About Charity fundraiser in the Last Year: Not on file  . Ran Out of Food in the Last Year: Not on file  Transportation Needs:   . Lack of Transportation (Medical): Not on file  . Lack of Transportation (Non-Medical): Not on file  Physical Activity:   . Days of Exercise per Week: Not on file  . Minutes of Exercise per Session: Not on file  Stress:   . Feeling of Stress : Not on file  Social Connections:   . Frequency of Communication  with Friends and Family: Not on file  . Frequency of Social Gatherings with Friends and Family: Not on file  . Attends Religious Services: Not on file  . Active Member of Clubs or Organizations: Not on file  . Attends Archivist Meetings: Not on file  . Marital Status: Not on file  Intimate Partner Violence:   . Fear of Current or Ex-Partner: Not on file  . Emotionally Abused: Not on file  . Physically Abused: Not on file  . Sexually Abused: Not on file     Review of Systems: General: negative for chills, fever, night sweats or weight changes.  Cardiovascular: negative for chest pain, dyspnea on exertion, edema, orthopnea, palpitations, paroxysmal nocturnal dyspnea or shortness of breath Dermatological: negative for rash Respiratory: negative for cough or wheezing Urologic: negative for hematuria Abdominal: negative for nausea, vomiting, diarrhea, bright red blood per rectum, melena, or hematemesis Neurologic: negative for visual changes, syncope, or dizziness All other systems reviewed and are otherwise negative except as noted above.    Blood pressure 129/87, pulse 99, height _0  (1.626 m), weight 236 lb 6.4 oz (107.2 kg), SpO2 96 %.  General appearance: alert and no distress Neck: no adenopathy, no carotid bruit, no JVD, supple, symmetrical, trachea midline and thyroid not enlarged, symmetric, no tenderness/mass/nodules Lungs: clear to auscultation bilaterally Heart: regular rate and rhythm, S1, S2 normal, no murmur, click, rub or gallop Extremities: extremities normal, atraumatic, no cyanosis or edema Pulses: 2+ and symmetric Skin: Skin color, texture, turgor normal. No rashes or lesions Neurologic: Alert and oriented X 3, normal strength and tone. Normal symmetric reflexes. Normal coordination and gait  EKG not performed today  ASSESSMENT AND PLAN:   Essential hypertension History of hypertension with blood pressure measured today 129/87.  Her blood pressure  has been under better control.  She is on metoprolol and Benicar.  Morbid obesity ([place]) History morbid obesity with at least a 15 pound weight loss since I saw her 3 months ago.  I suspect this is all fluid weight from increasing her diuretics.  She feels somewhat better.  Her edema is improved as is her breathing.  Bilateral edema of lower extremity Improved on increased dose of furosemide  Lorretta Harp MD FACP,FACC,FAHA, [facility] 01/11/2020 3:53 PM

## 2020-01-11 NOTE — Patient Instructions (Signed)
Medication Instructions:  Your physician recommends that you continue on your current medications as directed. Please refer to the Current Medication list given to you today.  *If you need a refill on your cardiac medications before your next appointment, please call your pharmacy  Follow-Up: At [facility], you and your health needs are our priority.  As part of our continuing mission to provide you with exceptional heart care, we have created designated Provider Care Teams.  These Care Teams include your primary Cardiologist (physician) and Advanced Practice Providers (APPs -  Physician Assistants and Nurse Practitioners) who all work together to provide you with the care you need, when you need it.  We recommend signing up for the patient portal called "MyChart".  Sign up information is provided on this After Visit Summary.  MyChart is used to connect with patients for Virtual Visits (Telemedicine).  Patients are able to view lab/test results, encounter notes, upcoming appointments, etc.  Non-urgent messages can be sent to your provider as well.   To learn more about what you can do with MyChart, go to [email or web address].    Your next appointment:   3 month(s) with Kerin Ransom PA 6 months with Dr. Gwenlyn Found

## 2020-01-11 NOTE — Assessment & Plan Note (Signed)
Improved on increased dose of furosemide

## 2020-01-11 NOTE — Assessment & Plan Note (Signed)
History morbid obesity with at least a 15 pound weight loss since I saw her 3 months ago.  I suspect this is all fluid weight from increasing her diuretics.  She feels somewhat better.  Her edema is improved as is her breathing.

## 2020-01-16 ENCOUNTER — Ambulatory Visit: Payer: Medicare Other | Admitting: Physical Therapy

## 2020-01-16 ENCOUNTER — Other Ambulatory Visit: Payer: Self-pay

## 2020-01-16 ENCOUNTER — Encounter: Payer: Self-pay | Admitting: Physical Therapy

## 2020-01-16 DIAGNOSIS — M79605 Pain in left leg: Secondary | ICD-10-CM | POA: Diagnosis not present

## 2020-01-16 DIAGNOSIS — R2689 Other abnormalities of gait and mobility: Secondary | ICD-10-CM | POA: Diagnosis not present

## 2020-01-16 DIAGNOSIS — M25552 Pain in left hip: Secondary | ICD-10-CM

## 2020-01-16 DIAGNOSIS — G8929 Other chronic pain: Secondary | ICD-10-CM | POA: Diagnosis not present

## 2020-01-16 DIAGNOSIS — R293 Abnormal posture: Secondary | ICD-10-CM

## 2020-01-16 DIAGNOSIS — M5442 Lumbago with sciatica, left side: Secondary | ICD-10-CM | POA: Diagnosis not present

## 2020-01-16 NOTE — Therapy (Signed)
[facility] [address], [place], [place], [address] Phone: [phone]   Fax:  [phone]  Physical Therapy Treatment  Patient Details  Name: [name] MRN: [id number] Date of Birth: 07-12-1940 Referring Provider (PT): Isaac Bliss, Rayford Halsted, MD   Encounter Date: 01/16/2020   PT End of Session - 01/16/20 1231    Visit Number 2    Date for PT Re-Evaluation 02/20/20    Authorization Type Medicare - KX at visit 15    PT Start Time 1150    PT Stop Time 1230    PT Time Calculation (min) 40 min    Activity Tolerance Patient limited by pain;Patient limited by fatigue;Patient tolerated treatment well    Behavior During Therapy [facility] for tasks assessed/performed           Past Medical History:  Diagnosis Date  . Acute on chronic diastolic (congestive) heart failure ([place])   . Anemia    iron deficiency anemia - secondary to blood loss ( chronic)   . Arthritis    endstage changes bilateral knees/bilateral ankles.   . Asthma   . Carotid artery occlusion   . Chronic fatigue   . Chronic kidney disease   . Closed left hip fracture ([place])   . Clotting disorder ([place])    pt denies this  . Contusion of left knee    due to fall 1/14.  [name] COPD (chronic obstructive pulmonary disease) (HCC)    pulmonary fibrosis  . Depression, reactive   . Diabetes mellitus    type II   . Diastolic dysfunction   . Difficulty in walking   . Family history of heart disease   . Generalized muscle weakness   . Gout   . High cholesterol   . History of falling   . Hypertension   . Hypothyroidism   . Interstitial lung disease ([place])   . 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    . Morbid obesity ([place])   . Morbid obesity ([place])   . Neuromuscular disorder ([place])    diabetic neuropathy   . Normal coronary arteries    cardiac catheterization  performed  10/31/14  . RA (rheumatoid arthritis) ([place])    has been off methotreaxte since 10/13.  [name] Rheumatoid arthritis ([place])   . Shortness of breath   . Spinal stenosis of lumbar region   . Thyroid disease   . Unspecified lack of coordination   . URI (upper respiratory infection)     Past Surgical History:  Procedure Laterality Date  . ABDOMINAL HYSTERECTOMY    . BRAIN SURGERY     Gamma knife 10/13. Needs repeat spring  '14  . CARDIAC CATHETERIZATION N/A 10/31/2014   Procedure: Right/Left Heart Cath and Coronary Angiography;  Surgeon: [name], MD;  Location: [facility];  Service: Cardiovascular;  Laterality: N/A;  . ESOPHAGOGASTRODUODENOSCOPY (EGD) WITH PROPOFOL N/A 09/14/2014   Procedure: ESOPHAGOGASTRODUODENOSCOPY (EGD) WITH PROPOFOL;  Surgeon: [name], MD;  Location: [facility];  Service: Endoscopy;  Laterality: N/A;  . INCISION AND DRAINAGE HIP Left 01/16/2017   Procedure: IRRIGATION AND DEBRIDEMENT LEFT HIP;  Surgeon: [name], MD;  Location: [facility];  Service: Orthopedics;  Laterality: Left;  . INTRAMEDULLARY (IM) NAIL INTERTROCHANTERIC Left 11/29/2016   Procedure: INTRAMEDULLARY (IM) NAIL INTERTROCHANTRIC;  Surgeon: [name], MD;  Location: [facility];  Service: Orthopedics;  Laterality:  Left;  . OVARY SURGERY    . SHOULDER SURGERY Left   . TONSILLECTOMY  age 21  . VIDEO BRONCHOSCOPY Bilateral 05/31/2013   Procedure: VIDEO BRONCHOSCOPY WITHOUT FLUORO;  Surgeon: Brand Males, MD;  Location: [facility];  Service: Cardiopulmonary;  Laterality: Bilateral;  . video bronscoscopy  2000   lung    There were no vitals filed for this visit.   Subjective Assessment - 01/16/20 1154    Subjective I got two injections which has helped my shooting pain.  5-6/10 with walking with walker, 2-3/10 in sitting.    Pertinent History PMH: RA, DM, OA bil knees, lumbar stenosis, PSH: Lt femur ORIF 12/2016    Limitations Standing;Walking     How long can you sit comfortably? better in sitting    How long can you stand comfortably? 1 min    How long can you walk comfortably? before flare up could do household ambuation, using walking can now walk from bed to bathroom    Diagnostic tests MRI scheduled for 8/10, 10/2019 lumbar xray diffuse DDD thoracolumbar spine, Gr I anterolisthesis L4/5, scoliosis    Patient Stated Goals learn some exercises that don't cause pain, reduce with pain with activity    Currently in Pain? Yes    Pain Score 6     Pain Location Back    Pain Orientation Left;Mid    Pain Descriptors / Indicators Aching    Pain Type Chronic pain    Pain Onset More than a month ago    Pain Frequency Constant                             [facility] Adult PT Treatment/Exercise - 01/16/20 0001      Exercises   Exercises Lumbar;Knee/Hip;Shoulder;Ankle      Lumbar Exercises: Aerobic   Nustep L1 seat 7 arms 10 x 8', PT present to monitor for pain, respiration rate      Lumbar Exercises: Seated   Other Seated Lumbar Exercises UE press into tray table 5x5 sec holds, seated in chair + black pad      Knee/Hip Exercises: Seated   Long Arc Quad Strengthening;Both;2 sets;5 reps    Other Seated Knee/Hip Exercises hip flexion isom 3x5 sec    Marching Both;20 reps    Marching Limitations sit tall, Pt got winded    Sit to Sand 5 reps;with UE support   PT cued exhale on stand, move slower, hands on knees     Ankle Exercises: Seated   Heel Raises Both;20 reps    Toe Raise 20 reps                    PT Short Term Goals - 01/16/20 1239      PT SHORT TERM GOAL #1   Title Pt will be ind with initial HEP    Status Achieved             PT Long Term Goals - 12/26/19 1313      PT LONG TERM GOAL #1   Title Pt will be able to tolerate ambulation with rollator x 240 feet for short community outings to MD visits or grocery store.    Baseline -    Time 8    Period Weeks    Status New    Target Date  02/20/20      PT LONG TERM GOAL #2   Title Pt will be able to  tolerate light standing household tasks for up to 10 min before needing seated break to demo improved endurance.    Baseline -    Time 8    Period Weeks    Status New    Target Date 02/20/20      PT LONG TERM GOAL #3   Title Pt will demo trunk ROM WFL for improved ease for self-care tasks such as bathing and dressing.    Baseline -    Time 8    Period Weeks    Status New    Target Date 02/20/20      PT LONG TERM GOAL #4   Title Pt will report improved LBP and Lt LE pain by at least 50% with transfers and ADLs    Baseline -    Time 8    Period Weeks    Status New    Target Date 02/20/20      PT LONG TERM GOAL #5   Title Pt will achieve LE strength of at least 4+/5 throughout bil LEs for improved transfers and gait    Baseline -    Time 8    Period Weeks    Status New    Target Date 02/20/20                 Plan - 01/16/20 1231    Clinical Impression Statement Pt returns for first visit from evaluation.  She has had 2 lumbar injecitons and discussed lumbar MRI with MD.  She has a large disc extrusion at L2/3.  She is not a good surgical candidate due to complex medical history.  She said injections have taken sharp pains away.  She was able to demo her HEP with a need to clarify hip flexion isometric.  PT introduced seated tall bil UE press into table for postural isometrics, sit to stands from black pad and NuStep L1 today.  PT cued exhale and glut/ab use with sit to stand from black pad vs use momentum lumbar extension hinging which allowed for sit to stand without pain.  Updated HEP with this today.  PT encouraged household ambulation throughout the day in short amounts to build tolerance and endurance as able.  Pt needed intermittent breaks for cardiovascular system challenge with repeated exercises.  She used rolling walker to come/go from visit which was very taxing given she usually uses W/C.  She ambulates  with signif flexed posture resting forearms on walker.  She will continue to benefit from skilled intervention for guided increase in strength, endurance, postural stability, and functional movement.    Comorbidities obesity, RA, DM, lumbar stenosis, Lt femur ORIF    Rehab Potential Good    PT Frequency 2x / week    PT Duration 12 weeks    PT Treatment/Interventions ADLs/Self Care Home Management;Aquatic Therapy;Electrical Stimulation;Cryotherapy;Moist Heat;DME Instruction;Gait training;Functional mobility training;Therapeutic activities;Therapeutic exercise;Balance training;Neuromuscular re-education;Manual techniques;Wheelchair mobility training;Patient/family education;Passive range of motion;Energy conservation;Spinal Manipulations;Joint Manipulations    PT Next Visit Plan review sit to stand from black pad in chair (exhale and use gluts/abs), NuStep L1 x 8-9 min, add seated tband, ball squeeze, eventually try standing weight shifts and progress more upright posture as tol    PT Home Exercise Plan Access Code [email or web address]    Consulted and Agree with Plan of Care Family member/caregiver    Family Member Consulted Pt's daughter           Patient will benefit from skilled therapeutic intervention in order to improve the following  deficits and impairments:     Visit Diagnosis: Chronic left-sided low back pain with left-sided sciatica  Pain in left hip  Other abnormalities of gait and mobility  Abnormal posture     Problem List Patient Active Problem List   Diagnosis Date Noted  . Diabetic neuropathy ([place]) 05/24/2019  . Sepsis secondary to UTI ([place]) 04/25/2019  . Cellulitis in diabetic foot ([place]) 04/25/2019  . Hypokalemia 04/21/2019  . Anticoagulated 06/25/2018  . Chronic pain 06/25/2018  . CRI (chronic renal insufficiency), stage 3 (moderate) 06/25/2018  . Drug-induced constipation 06/18/2018  . DVT (deep venous thrombosis) ([place]) 06/03/2018  . Syncope and collapse 06/02/2018  .  Chest pain 06/02/2018  . Primary osteoarthritis of both knees 05/26/2018  . Body mass index (BMI) of 50-59.9 in adult ([place]) 05/26/2018  . Acquired hypothyroidism 05/26/2018  . Chronic bilateral low back pain without sciatica 02/17/2018  . Astigmatism with presbyopia, bilateral 03/26/2017  . Cortical age-related cataract of both eyes 03/26/2017  . Family history of glaucoma 03/26/2017  . Long term current use of oral hypoglycemic drug 03/26/2017  . Nuclear sclerotic cataract of both eyes 03/26/2017  . Pain in left hip 02/18/2017  . Type 2 diabetes mellitus ([place]) 01/16/2017  . Closed nondisplaced intertrochanteric fracture of left femur with delayed healing 01/14/2017  . Postoperative wound infection 01/14/2017  . Gout 11/29/2016  . Depression 11/29/2016  . Closed left hip fracture ([place]) 11/29/2016  . GERD (gastroesophageal reflux disease) 11/29/2016  . Closed intertrochanteric fracture of hip, left, initial encounter ([place]) 11/29/2016  . Physical deconditioning 07/31/2015  . Pulmonary fibrosis, postinflammatory ([place]) 07/31/2015  . Bronchiectasis without complication ([place]) 62/95/2841  . Tendinitis of left rotator cuff 06/13/2015  . Acute on chronic diastolic heart failure ([place]) 04/21/2015  . Acute kidney injury superimposed on chronic kidney disease ([place]) 04/17/2015  . Hypotension 04/17/2015  . Chronic respiratory failure with hypoxia ([place]) 02/09/2015  . Dyspnea and respiratory abnormality 01/03/2015  . Normal coronary arteries 11/02/2014  . Morbid obesity ([place]) 10/30/2014  . Dysphagia 08/21/2014  . Chronic fatigue 06/21/2014  . DOE (dyspnea on exertion) 05/24/2014  . Primary osteoarthritis of left knee 04/26/2014  . High risk medication use 04/17/2014  . Aphasia 02/12/2014  . 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 ([place]) 01/18/2014  . Lumbar facet arthropathy 01/18/2014  . Bilateral edema of lower extremity 05/30/2013  . Chronic cough 05/24/2013  . ILD (interstitial  lung disease) ([place]) 04/10/2013  . Greater trochanteric bursitis of left hip 08/13/2012  . Spinal stenosis of lumbar region with radiculopathy 07/12/2012  . Inability to walk 07/12/2012  . Asymptomatic bacteriuria 07/12/2012  . Meningioma of left sphenoid wing involving cavernous sinus ([place]) 02/17/2012  . Chest pain of uncertain etiology 32/44/0102  . Rheumatoid arthritis ([place]) 01/28/2012  . Primary osteoarthritis of right knee 01/28/2012  . Dyslipidemia   . Thyroid disease   . Essential hypertension     Baruch Merl, PT 01/16/20 12:40 PM   [facility] [address] [place], [place], [address] Phone: [phone]   Fax:  [phone]  Name: [name] MRN: [id number] Date of Birth: [birth year]

## 2020-01-18 ENCOUNTER — Other Ambulatory Visit: Payer: Self-pay

## 2020-01-18 ENCOUNTER — Ambulatory Visit: Payer: Medicare Other | Attending: Internal Medicine | Admitting: Physical Therapy

## 2020-01-18 ENCOUNTER — Encounter: Payer: Self-pay | Admitting: Physical Therapy

## 2020-01-18 DIAGNOSIS — R2689 Other abnormalities of gait and mobility: Secondary | ICD-10-CM | POA: Diagnosis not present

## 2020-01-18 DIAGNOSIS — G8929 Other chronic pain: Secondary | ICD-10-CM | POA: Diagnosis not present

## 2020-01-18 DIAGNOSIS — M25552 Pain in left hip: Secondary | ICD-10-CM | POA: Insufficient documentation

## 2020-01-18 DIAGNOSIS — R293 Abnormal posture: Secondary | ICD-10-CM | POA: Diagnosis not present

## 2020-01-18 DIAGNOSIS — M5442 Lumbago with sciatica, left side: Secondary | ICD-10-CM | POA: Insufficient documentation

## 2020-01-18 DIAGNOSIS — M545 Low back pain: Secondary | ICD-10-CM | POA: Diagnosis not present

## 2020-01-18 DIAGNOSIS — M79605 Pain in left leg: Secondary | ICD-10-CM | POA: Diagnosis not present

## 2020-01-18 NOTE — Therapy (Signed)
[facility] [address] [place], [place], [address] Phone: [phone]   Fax:  [phone]  Physical Therapy Treatment  Patient Details  Name: [name] MRN: [id number] Date of Birth: February 12, 1941 Referring Provider (PT): Isaac Bliss, Rayford Halsted, MD   Encounter Date: 01/18/2020   PT End of Session - 01/18/20 0942    Visit Number 3    Date for PT Re-Evaluation 02/20/20    Authorization Type Medicare - KX at visit 15    PT Start Time 0935    PT Stop Time 1013    PT Time Calculation (min) 38 min    Activity Tolerance Patient limited by pain;Patient limited by fatigue;Patient tolerated treatment well    Behavior During Therapy [facility] for tasks assessed/performed           Past Medical History:  Diagnosis Date  . Acute on chronic diastolic (congestive) heart failure ([place])   . Anemia    iron deficiency anemia - secondary to blood loss ( chronic)   . Arthritis    endstage changes bilateral knees/bilateral ankles.   . Asthma   . Carotid artery occlusion   . Chronic fatigue   . Chronic kidney disease   . Closed left hip fracture ([place])   . Clotting disorder ([place])    pt denies this  . Contusion of left knee    due to fall 1/14.  [name] COPD (chronic obstructive pulmonary disease) (HCC)    pulmonary fibrosis  . Depression, reactive   . Diabetes mellitus    type II   . Diastolic dysfunction   . Difficulty in walking   . Family history of heart disease   . Generalized muscle weakness   . Gout   . High cholesterol   . History of falling   . Hypertension   . Hypothyroidism   . Interstitial lung disease ([place])   . 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    . Morbid obesity ([place])   . Morbid obesity ([place])   . Neuromuscular disorder ([place])    diabetic neuropathy   . Normal coronary arteries    cardiac catheterization  performed  10/31/14  . RA (rheumatoid arthritis) ([place])    has been off methotreaxte since 10/13.  [name] Rheumatoid arthritis ([place])   . Shortness of breath   . Spinal stenosis of lumbar region   . Thyroid disease   . Unspecified lack of coordination   . URI (upper respiratory infection)     Past Surgical History:  Procedure Laterality Date  . ABDOMINAL HYSTERECTOMY    . BRAIN SURGERY     Gamma knife 10/13. Needs repeat spring  '14  . CARDIAC CATHETERIZATION N/A 10/31/2014   Procedure: Right/Left Heart Cath and Coronary Angiography;  Surgeon: [name], MD;  Location: [facility];  Service: Cardiovascular;  Laterality: N/A;  . ESOPHAGOGASTRODUODENOSCOPY (EGD) WITH PROPOFOL N/A 09/14/2014   Procedure: ESOPHAGOGASTRODUODENOSCOPY (EGD) WITH PROPOFOL;  Surgeon: [name], MD;  [facility];  Service: Endoscopy;  Laterality: N/A;  . INCISION AND DRAINAGE HIP Left 01/16/2017   Procedure: IRRIGATION AND DEBRIDEMENT LEFT HIP;  Surgeon: [name], MD;  [facility];  Service: Orthopedics;  Laterality: Left;  . INTRAMEDULLARY (IM) NAIL INTERTROCHANTERIC Left 11/29/2016   Procedure: INTRAMEDULLARY (IM) NAIL INTERTROCHANTRIC;  Surgeon: [name], MD;  Location: [facility];  Service: Orthopedics;  Laterality:  Left;  . OVARY SURGERY    . SHOULDER SURGERY Left   . TONSILLECTOMY  age 79  . VIDEO BRONCHOSCOPY Bilateral 05/31/2013   Procedure: VIDEO BRONCHOSCOPY WITHOUT FLUORO;  Surgeon: Brand Males, MD;  Location: [facility];  Service: Cardiopulmonary;  Laterality: Bilateral;  . video bronscoscopy  2000   lung    There were no vitals filed for this visit.   Subjective Assessment - 01/18/20 0940    Subjective I am sore today but a muscle soreness. I did a lot of the HEP yesterday and was sore by last night.  I am walking more with my walker at home and using it to come to appointments vs my W/C.    Pertinent History PMH: RA, DM, OA bil knees,  lumbar stenosis, PSH: Lt femur ORIF 12/2016    Limitations Standing;Walking    How long can you sit comfortably? better in sitting    How long can you stand comfortably? 1 min    How long can you walk comfortably? before flare up could do household ambuation, using walking can now walk from bed to bathroom    Diagnostic tests MRI scheduled for 8/10, 10/2019 lumbar xray diffuse DDD thoracolumbar spine, Gr I anterolisthesis L4/5, scoliosis    Patient Stated Goals learn some exercises that don't cause pain, reduce with pain with activity    Currently in Pain? Yes    Pain Score 5     Pain Location Buttocks    Pain Orientation Right;Left    Pain Descriptors / Indicators Aching;Sore    Pain Type Chronic pain    Pain Onset More than a month ago    Pain Frequency Constant    Aggravating Factors  stand, walk, reach, sit to stand, bed mobility    Pain Relieving Factors ice                             [facility] Adult PT Treatment/Exercise - 01/18/20 0001      Exercises   Exercises Shoulder;Lumbar;Knee/Hip;Ankle      Lumbar Exercises: Aerobic   Nustep L1 seat 8 arms 10 x 10', PT present to monitor and discuss symptoms      Lumbar Exercises: Seated   Long Arc Quad on Chair Strengthening;Both;AROM;10 reps      Knee/Hip Exercises: Seated   Abd/Adduction Limitations ball squeeze 10x3 sec with TrA and sit tall cue      Shoulder Exercises: Seated   Extension Strengthening;Both;10 reps;Theraband    Theraband Level (Shoulder Extension) Level 1 (Yellow)    Row Strengthening;Both;10 reps;Theraband    Theraband Level (Shoulder Row) Level 1 (Yellow)    Horizontal ABduction Strengthening;Both;Theraband;10 reps    Theraband Level (Shoulder Horizontal ABduction) Level 1 (Yellow)      Ankle Exercises: Seated   Ankle Circles/Pumps Both;10 reps   in static LAQ   Other Seated Ankle Exercises heel/toe raises x 20                    PT Short Term Goals - 01/16/20 1239      PT  SHORT TERM GOAL #1   Title Pt will be ind with initial HEP    Status Achieved             PT Long Term Goals - 12/26/19 1313      PT LONG TERM GOAL #1   Title Pt will be able to tolerate ambulation with rollator x 240 feet  for short community outings to MD visits or grocery store.    Baseline -    Time 8    Period Weeks    Status New    Target Date 02/20/20      PT LONG TERM GOAL #2   Title Pt will be able to tolerate light standing household tasks for up to 10 min before needing seated break to demo improved endurance.    Baseline -    Time 8    Period Weeks    Status New    Target Date 02/20/20      PT LONG TERM GOAL #3   Title Pt will demo trunk ROM WFL for improved ease for self-care tasks such as bathing and dressing.    Baseline -    Time 8    Period Weeks    Status New    Target Date 02/20/20      PT LONG TERM GOAL #4   Title Pt will report improved LBP and Lt LE pain by at least 50% with transfers and ADLs    Baseline -    Time 8    Period Weeks    Status New    Target Date 02/20/20      PT LONG TERM GOAL #5   Title Pt will achieve LE strength of at least 4+/5 throughout bil LEs for improved transfers and gait    Baseline -    Time 8    Period Weeks    Status New    Target Date 02/20/20                 Plan - 01/18/20 1333    Clinical Impression Statement Pt arrived sore in her buttocks from ther ex but she is so glad to be doing more.  She was able to do 10 min on the NuStep on arrival and used her rolling walker vs W/C to attend session today.  She continues to ambulate in signif trunk flexion, leaning forearms on walker due to severe pain with upright standing.  PT focused on UE tband ther ex for postural support from sitting.  Pt is displaying improved ability to sit up tall and engage deep abdominals with UE tband.  She needs close supervision with gait and transfers for safety.  PT gave updated HEP for tband UE exercises today.  Pt will  continue to benefit from skilled PT along POC.    Comorbidities obesity, RA, DM, lumbar stenosis, Lt femur ORIF    PT Frequency 2x / week    PT Duration 12 weeks    PT Next Visit Plan review HEP, bed mobility, endurance, sit to stand from black pad    PT Home Exercise Plan Access Code [email or web address]    Consulted and Agree with Plan of Care Family member/caregiver    Family Member Consulted Pt's daughter           Patient will benefit from skilled therapeutic intervention in order to improve the following deficits and impairments:     Visit Diagnosis: Chronic left-sided low back pain with left-sided sciatica  Pain in left hip  Other abnormalities of gait and mobility  Abnormal posture  Pain in left leg     Problem List Patient Active Problem List   Diagnosis Date Noted  . Diabetic neuropathy ([place]) 05/24/2019  . Sepsis secondary to UTI ([place]) 04/25/2019  . Cellulitis in diabetic foot ([place]) 04/25/2019  . Hypokalemia 04/21/2019  . Anticoagulated 06/25/2018  . Chronic  pain 06/25/2018  . CRI (chronic renal insufficiency), stage 3 (moderate) 06/25/2018  . Drug-induced constipation 06/18/2018  . DVT (deep venous thrombosis) ([place]) 06/03/2018  . Syncope and collapse 06/02/2018  . Chest pain 06/02/2018  . Primary osteoarthritis of both knees 05/26/2018  . Body mass index (BMI) of 50-59.9 in adult ([place]) 05/26/2018  . Acquired hypothyroidism 05/26/2018  . Chronic bilateral low back pain without sciatica 02/17/2018  . Astigmatism with presbyopia, bilateral 03/26/2017  . Cortical age-related cataract of both eyes 03/26/2017  . Family history of glaucoma 03/26/2017  . Long term current use of oral hypoglycemic drug 03/26/2017  . Nuclear sclerotic cataract of both eyes 03/26/2017  . Pain in left hip 02/18/2017  . Type 2 diabetes mellitus ([employer]) 01/16/2017  . Closed nondisplaced intertrochanteric fracture of left femur with delayed healing 01/14/2017  . Postoperative wound infection  01/14/2017  . Gout 11/29/2016  . Depression 11/29/2016  . Closed left hip fracture ([place]) 11/29/2016  . GERD (gastroesophageal reflux disease) 11/29/2016  . Closed intertrochanteric fracture of hip, left, initial encounter ([place]) 11/29/2016  . Physical deconditioning 07/31/2015  . Pulmonary fibrosis, postinflammatory ([name]) 07/31/2015  . Bronchiectasis without complication ([place]) 38/17/7116  . Tendinitis of left rotator cuff 06/13/2015  . Acute on chronic diastolic heart failure ([place]) 04/21/2015  . Acute kidney injury superimposed on chronic kidney disease ([place]) 04/17/2015  . Hypotension 04/17/2015  . Chronic respiratory failure with hypoxia ([place]) 02/09/2015  . Dyspnea and respiratory abnormality 01/03/2015  . Normal coronary arteries 11/02/2014  . Morbid obesity ([place]) 10/30/2014  . Dysphagia 08/21/2014  . Chronic fatigue 06/21/2014  . DOE (dyspnea on exertion) 05/24/2014  . Primary osteoarthritis of left knee 04/26/2014  . High risk medication use 04/17/2014  . Aphasia 02/12/2014  . 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 Lumbar facet arthropathy 01/18/2014  . Bilateral edema of lower extremity 05/30/2013  . Chronic cough 05/24/2013  . ILD (interstitial lung disease) ([place]) 04/10/2013  . Greater trochanteric bursitis of left hip 08/13/2012  . Spinal stenosis of lumbar region with radiculopathy 07/12/2012  . Inability to walk 07/12/2012  . Asymptomatic bacteriuria 07/12/2012  . Meningioma of left sphenoid wing involving cavernous sinus ([place]) 02/17/2012  . Chest pain of uncertain etiology 57/90/3833  . Rheumatoid arthritis ([place]) 01/28/2012  . Primary osteoarthritis of right knee 01/28/2012  . Dyslipidemia   . Thyroid disease   . Essential hypertension     Baruch Merl, PT 01/18/20 1:37 PM   [facility] [address] [place], [place], [address] Phone: [phone]   Fax:   [phone]  Name: [name] MRN: [id number] Date of Birth: [birth year]

## 2020-01-25 ENCOUNTER — Encounter: Payer: Self-pay | Admitting: Physical Medicine & Rehabilitation

## 2020-01-25 ENCOUNTER — Telehealth: Payer: Self-pay

## 2020-01-25 ENCOUNTER — Other Ambulatory Visit: Payer: Self-pay

## 2020-01-25 ENCOUNTER — Encounter: Payer: Medicare Other | Attending: Physical Medicine & Rehabilitation | Admitting: Physical Medicine & Rehabilitation

## 2020-01-25 VITALS — BP 158/100 | HR 104 | Ht 64.0 in | Wt 236.0 lb

## 2020-01-25 DIAGNOSIS — M47816 Spondylosis without myelopathy or radiculopathy, lumbar region: Secondary | ICD-10-CM | POA: Insufficient documentation

## 2020-01-25 DIAGNOSIS — M5416 Radiculopathy, lumbar region: Secondary | ICD-10-CM | POA: Diagnosis not present

## 2020-01-25 DIAGNOSIS — M48062 Spinal stenosis, lumbar region with neurogenic claudication: Secondary | ICD-10-CM | POA: Insufficient documentation

## 2020-01-25 DIAGNOSIS — M170 Bilateral primary osteoarthritis of knee: Secondary | ICD-10-CM

## 2020-01-25 DIAGNOSIS — M48061 Spinal stenosis, lumbar region without neurogenic claudication: Secondary | ICD-10-CM | POA: Diagnosis not present

## 2020-01-25 DIAGNOSIS — F329 Major depressive disorder, single episode, unspecified: Secondary | ICD-10-CM | POA: Insufficient documentation

## 2020-01-25 DIAGNOSIS — E1142 Type 2 diabetes mellitus with diabetic polyneuropathy: Secondary | ICD-10-CM | POA: Insufficient documentation

## 2020-01-25 DIAGNOSIS — M7062 Trochanteric bursitis, left hip: Secondary | ICD-10-CM | POA: Insufficient documentation

## 2020-01-25 DIAGNOSIS — Z5181 Encounter for therapeutic drug level monitoring: Secondary | ICD-10-CM | POA: Insufficient documentation

## 2020-01-25 DIAGNOSIS — Z9889 Other specified postprocedural states: Secondary | ICD-10-CM | POA: Insufficient documentation

## 2020-01-25 DIAGNOSIS — M069 Rheumatoid arthritis, unspecified: Secondary | ICD-10-CM | POA: Insufficient documentation

## 2020-01-25 DIAGNOSIS — Z90710 Acquired absence of both cervix and uterus: Secondary | ICD-10-CM | POA: Insufficient documentation

## 2020-01-25 DIAGNOSIS — Z79891 Long term (current) use of opiate analgesic: Secondary | ICD-10-CM | POA: Insufficient documentation

## 2020-01-25 DIAGNOSIS — M1711 Unilateral primary osteoarthritis, right knee: Secondary | ICD-10-CM | POA: Insufficient documentation

## 2020-01-25 DIAGNOSIS — G894 Chronic pain syndrome: Secondary | ICD-10-CM | POA: Insufficient documentation

## 2020-01-25 DIAGNOSIS — M1712 Unilateral primary osteoarthritis, left knee: Secondary | ICD-10-CM | POA: Insufficient documentation

## 2020-01-25 DIAGNOSIS — E1122 Type 2 diabetes mellitus with diabetic chronic kidney disease: Secondary | ICD-10-CM | POA: Insufficient documentation

## 2020-01-25 MED ORDER — TAPENTADOL HCL 50 MG PO TABS: 50.0000 mg | ORAL_TABLET | Freq: Four times a day (QID) | ORAL | 0 refills | Status: DC | PRN

## 2020-01-25 NOTE — Progress Notes (Signed)
Subjective:    Patient ID: [name], female    DOB: 1941/01/12, 79 y.o.   MRN: [id number]  HPI   The patient has consented to a tele-health/telephone encounter with [place]. The patient is at home and the provider is at the office. Two separate patient identifiers were utilized to confirm the identity of the patient.    We are meeting today regarding [name] chronic pain. She developed increased pain in her back over the summer with radiation from her back to her left leg,groin. She had good results with left L2-3, L3-4 transforaminal ESI's by Dr. Ernestina Patches in August   She continues deal with chronic respiratory failure related to her interstitial lung disease/PF. She was followed closely by pulmonology and cardiology. She stopped zithromax as she had a reaction with her plaquenil.    She feels that nucynta has made a big difference with her pain in her general. It took a little getting used to after stopping MS contin and IR. She takes it typically 3 x per day as the 4th dose can make her too sleepy. She uses gabapentin scheduled and flexeril prn for spasms   Pain Inventory Average Pain 6 Pain Right Now 6 My pain is sharp and aching  In the last 24 hours, has pain interfered with the following? General activity 6 Relation with others 6 Enjoyment of life 6 What TIME of day is your pain at its worst? varies Sleep (in general) Poor  Pain is worse with: walking, standing and some activites Pain improves with: rest and medication Relief from Meds: 6  Family History  Problem Relation Age of Onset  . Diabetes Mother   . Heart attack Mother   . Hypertension Father   . Lung cancer Father   . Diabetes Sister   . Diabetes Brother   . Hypertension Brother   . Heart disease Brother   . Heart attack Brother   . Kidney cancer Brother   . Uterine cancer Daughter   . Breast cancer Sister   . Rheum arthritis Maternal Uncle   . Gout  Brother   . Kidney failure Brother        x 5   Social History   Socioeconomic History  . Marital status: Married    Spouse name: Not on file  . Number of children: 6  . Years of education: college  . Highest education level: Not on file  Occupational History  . Occupation: retired Marine scientist  Tobacco Use  . Smoking status: Never Smoker  . Smokeless tobacco: Never Used  Vaping Use  . Vaping Use: Never used  Substance and Sexual Activity  . Alcohol use: No  . Drug use: No  . Sexual activity: Not Currently  Other Topics Concern  . Not on file  Social History Narrative   Patient consumes 2-3 cups coffee per day.   Social Determinants of Health   Financial Resource Strain:   . Difficulty of Paying Living Expenses: Not on file  Food Insecurity:   . Worried About Charity fundraiser in the Last Year: Not on file  . Ran Out of Food in the Last Year: Not on file  Transportation Needs:   . Lack of Transportation (Medical): Not on file  . Lack of Transportation (Non-Medical): Not on file  Physical Activity:   . Days of Exercise per Week: Not on file  . Minutes of Exercise per Session: Not on file  Stress:   .  Feeling of Stress : Not on file  Social Connections:   . Frequency of Communication with Friends and Family: Not on file  . Frequency of Social Gatherings with Friends and Family: Not on file  . Attends Religious Services: Not on file  . Active Member of Clubs or Organizations: Not on file  . Attends Archivist Meetings: Not on file  . Marital Status: Not on file   Past Surgical History:  Procedure Laterality Date  . ABDOMINAL HYSTERECTOMY    . BRAIN SURGERY     Gamma knife 10/13. Needs repeat spring  '14  . CARDIAC CATHETERIZATION N/A 10/31/2014   Procedure: Right/Left Heart Cath and Coronary Angiography;  Surgeon: [name], MD;  Location: [facility];  Service: Cardiovascular;  Laterality: N/A;  . ESOPHAGOGASTRODUODENOSCOPY (EGD) WITH  PROPOFOL N/A 09/14/2014   Procedure: ESOPHAGOGASTRODUODENOSCOPY (EGD) WITH PROPOFOL;  Surgeon: [name], MD;  [facility];  Service: Endoscopy;  Laterality: N/A;  . INCISION AND DRAINAGE HIP Left 01/16/2017   Procedure: IRRIGATION AND DEBRIDEMENT LEFT HIP;  Surgeon: [name], MD;  [facility];  Service: Orthopedics;  Laterality: Left;  . INTRAMEDULLARY (IM) NAIL INTERTROCHANTERIC Left 11/29/2016   Procedure: INTRAMEDULLARY (IM) NAIL INTERTROCHANTRIC;  Surgeon: [name], MD;  Location: [facility];  Service: Orthopedics;  Laterality: Left;  . OVARY SURGERY    . SHOULDER SURGERY Left   . TONSILLECTOMY  age 74  . VIDEO BRONCHOSCOPY Bilateral 05/31/2013   Procedure: VIDEO BRONCHOSCOPY WITHOUT FLUORO;  Surgeon: Brand Males, MD;  Location: [facility];  Service: Cardiopulmonary;  Laterality: Bilateral;  . video bronscoscopy  2000   lung   Past Surgical History:  Procedure Laterality Date  . ABDOMINAL HYSTERECTOMY    . BRAIN SURGERY     Gamma knife 10/13. Needs repeat spring  '14  . CARDIAC CATHETERIZATION N/A 10/31/2014   Procedure: Right/Left Heart Cath and Coronary Angiography;  Surgeon: [name], MD;  Location: [facility];  Service: Cardiovascular;  Laterality: N/A;  . ESOPHAGOGASTRODUODENOSCOPY (EGD) WITH PROPOFOL N/A 09/14/2014   Procedure: ESOPHAGOGASTRODUODENOSCOPY (EGD) WITH PROPOFOL;  Surgeon: [name], MD;  [facility];  Service: Endoscopy;  Laterality: N/A;  . INCISION AND DRAINAGE HIP Left 01/16/2017   Procedure: IRRIGATION AND DEBRIDEMENT LEFT HIP;  Surgeon: [name], MD;  [facility];  Service: Orthopedics;  Laterality: Left;  . INTRAMEDULLARY (IM) NAIL INTERTROCHANTERIC Left 11/29/2016   Procedure: INTRAMEDULLARY (IM) NAIL INTERTROCHANTRIC;  Surgeon: [name], MD;  Location: [facility];  Service: Orthopedics;  Laterality: Left;  . OVARY SURGERY    . SHOULDER SURGERY  Left   . TONSILLECTOMY  age 70  . VIDEO BRONCHOSCOPY Bilateral 05/31/2013   Procedure: VIDEO BRONCHOSCOPY WITHOUT FLUORO;  Surgeon: Brand Males, MD;  Location: [facility];  Service: Cardiopulmonary;  Laterality: Bilateral;  . video bronscoscopy  2000   lung   Past Medical History:  Diagnosis Date  . Acute on chronic diastolic (congestive) heart failure ([place])   . Anemia    iron deficiency anemia - secondary to blood loss ( chronic)   . Arthritis    endstage changes bilateral knees/bilateral ankles.   . Asthma   . Carotid artery occlusion   . Chronic fatigue   . Chronic kidney disease   . Closed left hip fracture ([place])   . Clotting disorder ([place])    pt denies this  . Contusion of left knee    due to fall  1/14.  [name] COPD (chronic obstructive pulmonary disease) (HCC)    pulmonary fibrosis  . Depression, reactive   . Diabetes mellitus    type II   . Diastolic dysfunction   . Difficulty in walking   . Family history of heart disease   . Generalized muscle weakness   . Gout   . High cholesterol   . History of falling   . Hypertension   . Hypothyroidism   . Interstitial lung disease ([place])   . 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    . Morbid obesity ([place])   . Morbid obesity ([place])   . Neuromuscular disorder ([place])    diabetic neuropathy   . Normal coronary arteries    cardiac catheterization performed  10/31/14  . RA (rheumatoid arthritis) ([place])    has been off methotreaxte since 10/13.  [name] Rheumatoid arthritis ([place])   . Shortness of breath   . Spinal stenosis of lumbar region   . Thyroid disease   . Unspecified lack of coordination   . URI (upper respiratory infection)    BP (!) 158/100 Comment: pt reported, virtual visit  Pulse (!) 104 Comment: pt reported, virtual visit  Ht _0  (1.626 m) Comment: pt reported, virtual visit  Wt 236 lb (107 kg) Comment: pt reported, virtual  visit  BMI 40.51 kg/m   Opioid Risk Score:   Fall Risk Score:  `1  Depression screen PHQ 2/9  Depression screen [facility] 2/9 12/14/2019 09/22/2019 06/11/2018 05/26/2018 12/09/2017 10/22/2017 09/22/2017  Decreased Interest _1 0 0  Down, Depressed, Hopeless _2 0 0  PHQ - 2 Score _3 0 0  Altered sleeping - _4 - - -  Tired, decreased energy - _5 - - -  Change in appetite - _6 - - -  Feeling bad or failure about yourself  - _7 - - -  Trouble concentrating - 2 2 0 - - -  Moving slowly or fidgety/restless - 1 0 2 - - -  Suicidal thoughts - 0 0 0 - - -  PHQ-9 Score - _8 - - -  Difficult doing work/chores - Somewhat difficult - Somewhat difficult - - -  Some recent data might be hidden    Review of Systems  Respiratory: Positive for cough, shortness of breath and wheezing.   Musculoskeletal: Positive for back pain.       Pelvic area(left) pain Hip pain  Neurological: Positive for weakness and numbness.  All other systems reviewed and are negative.      Objective:   Physical Exam   n/a    Assessment & Plan:  Assessment:  1. Chronic low back pain with Lumbar spinal stenosis with neurogenic claudication. Associated facet arthropathy   -Left L2,3 radiculopathy, improving 2. Depression.  3. Diabetes mellitus type 2 with polyneuropathy  4. Rheumatoid arthritis and osteoarthritis. Multiple joint involvement including bilateral knees.  5. Left greater troch bursitis  6. Interstitial lung disease  7. Hx of left hip fracture, post-op infection in July 2017.          Plan:  1.maintain HEP as possible for low back and knees. Keep walking!  -outpt PT 2. Voltaren gel for fingers/knees.  -s/p zilretta injection of bilateral knees 08/24/19 with good results 3. Nucynta 47m every 6 hours prn #  120 was refilled. Will not resume MS Contin.  -We will continue the controlled substance monitoring program, this consists of regular clinic visits, examinations, routine  drug screening, pill counts as well as use of [place] Controlled Substance Reporting System. [place].   -Medication was refilled and a second prescription was sent to the patient's pharmacy for next month.     4 .Compression with zipped TEDS/ACE for LE edema,   -elevation as able in the evening.  -volume mgt per cardiology 5. Continue cymbalta  24m daily.  6. S/p Left L2-3, L3-4 trans LESI by [place] in august. May need follow up injections given ongoing radicular pain. Told her to give it some time however. She's on max gabapentin. Increase activity as able. PT should help too   Fifteen minutes tele time were spent during this visit. All questions were encouraged and answered.  Follow up with NP in 2 mos.  .[name]

## 2020-01-25 NOTE — Telephone Encounter (Signed)
Volunteer support call made for palliative care, patient doing physical therapy, no other changes

## 2020-01-26 ENCOUNTER — Encounter: Payer: Self-pay | Admitting: Nurse Practitioner

## 2020-01-26 ENCOUNTER — Telehealth: Payer: Self-pay

## 2020-01-26 ENCOUNTER — Other Ambulatory Visit: Payer: Medicare Other | Admitting: Nurse Practitioner

## 2020-01-26 ENCOUNTER — Other Ambulatory Visit: Payer: Self-pay

## 2020-01-26 DIAGNOSIS — M069 Rheumatoid arthritis, unspecified: Secondary | ICD-10-CM

## 2020-01-26 DIAGNOSIS — Z515 Encounter for palliative care: Secondary | ICD-10-CM

## 2020-01-26 NOTE — Progress Notes (Signed)
[facility] Palliative Care Consult Note Telephone: [phone]  Fax: [phone]  PATIENT NAME: [name] DOB: December 18, 1940 MRN: [id number]  PRIMARY CARE PROVIDER:   [name], MD  REFERRING PROVIDER:  [name], MD [place],  [place] [address]  RESPONSIBLE PARTY:   Self; husband [name]   RECOMMENDATIONS and PLAN: 1.ACP: full code currently with aggressive interventions.   2. RA; continue current regimen, pain management  3.Palliative care encounter Palliative medicine team will continue to support patient, patient's family, and medical team. Visit consisted of counseling and education dealing with the complex and emotionally intense issues of symptom management and palliative care in the setting of serious and potentially life-threatening   4. F/u 2 months for ongoing support with ongoing discussions of goc, monitoring chronic disease progression, symptoms   I spent 90  minutes providing this consultation,  from 12:00pm to 1:30pm. More than 50% of the time in this consultation was spent coordinating communication.   HISTORY OF PRESENT ILLNESS:  [name] is a 79 y.o. year old female with multiple medical problems including Chronic diastolic congestive heart failure, pulmonary fibrosis, interstitial lung disease, chronic respiratory failure with hypoxia, dysphasia, aphasia, carotid artery occlusion, COPD,history of DVT, chronic kidney disease, Rheumatoid arthritis, anemia, meningioma of left sphenoid wing involving cavernous sinus, hypothyroidism, hypertension, gerd, spinal stenosis of lumbar region,osteoarthritis, hypercholesterolemia, gout, morbid obesity, arthritis, cataract,chronic pain, history of left hip fracture s/p nail, tonsillectomy, left shoulder surgery, ovarian surgery, brain surgery, abdominal hysterectomy.In-person visit with [name] for follow-up palliative care. [name]  and I talked about purpose of palliative care visit, and agreement. We talked about how she was feeling today. Ms. Hansell talked about being a little on the tired side. [name] talked about going to physical therapy as an outpatient. [name] talked about going to as an outpatient. Ms. Preisler endorses it is helpful going to the physical therapist office as it gets her out even though she does get tired. [name] she has been doing her exercises three different types. [name] endorses she has discovered that sometimes when she does her arm exercises that causes her chest to hurt and into her back. [name] discuss that she did these exercises first today and that is when it initiated the pain for which she stopped. [name] currently is not experiencing pain in her chest as she verbalize that she will discuss this more with the physical therapist about doing exercises in a different form. We talked about symptoms of pain in her lower back for which she did receive an MRI that showed narrowing was spinal stenosis. [name] endorses she does continue to be followed by pain management which with increase in medication has helped the shooting pain. Ms. Resler endorses appetite is good. Recent Cardiology visit was also good. [name] talked at length about her heart. [name] endorses though her heart seems to be doing okay number why she does feeling overall tiredness with exhaustion and exertion. [name] talked about her lungs also being taxed. Ms. Deeann Cree endorses that her numbers were better so they did not allow her to keep her portable oxygen tank. [name] indoor says she does feel more fatigue exhaustion when she gets in the car and goes where she needs to go to come back home, she feels 15, exhausted though her O2 saturation seems to be good. Ms. Berndt talked about monitor her blood pressure twice  a day. We talked about adoption of monitoring her oxygen saturation while she is out  also and can see if there is a pattern to her not feeling as well at times. We talked about her weight. We talked about the edema in her lower extremities. We talked about chronic disease progression. We talked about edema in her lower extremities. Ms. Larsen when she was in the hospital she was over 300 lb but over the last year has lost down to 240's. Ms. Katayama and I talked about medical goals of care. We talked about chronic disease progression. We talked about how one organ system affect another such as cardiac vs. pulmonary vs. renal. We talked about her appetite. We talked about quality of life. [name] endorses she feels like her disease is progressing and she is getting worse. We talked about the challenges involved as disease has progressed and symptoms worsen such as the fatigue. [name] talked about family dynamics her six children for girls and two boys. One daughter passed away from cancer. We talked about her parents passing away her mother was an acute event though her father there were times where she could get prepared. We talked about [name] adult children helping her with when she was been in the hospital on the nursing home now home. Her oldest son has moved back in with her and her husband to help with the things that they need. [name] endorses her daughter's do come over and help her she has to has somebody that comes in during the day to help with housekeeping. We talked about realistic expectations with physical limitations. [name] endorses that her children do tell her that she does too much, she needs to do more for herself. We talked about the importance of self-care. We talked about coping strategies. We talked about role of palliative care and plan of care. We talked about follow-up palliative care visit in 2 months if needed or sooner should she declined. [name] in agreement appointment schedule. [name] expressed she was very thankful for palliative care  visit today and help considerably discussing disease progression with expectations. [name] talked about the benefits of today's visit. Therapeutic listening, emotional support provided. Contact information. Questions answered to satisfaction.  Palliative Care was asked to help to continue to address goals of care.   CODE STATUS: Full code  PPS: 50% HOSPICE ELIGIBILITY/DIAGNOSIS: TBD  PAST MEDICAL HISTORY:  Past Medical History:  Diagnosis Date  . Acute on chronic diastolic (congestive) heart failure ([place])   . Anemia    iron deficiency anemia - secondary to blood loss ( chronic)   . Arthritis    endstage changes bilateral knees/bilateral ankles.   . Asthma   . Carotid artery occlusion   . Chronic fatigue   . Chronic kidney disease   . Closed left hip fracture ([place])   . Clotting disorder ([place])    pt denies this  . Contusion of left knee    due to fall 1/14.  [name] COPD (chronic obstructive pulmonary disease) ([facility])    pulmonary fibrosis  . Depression, reactive   . Diabetes mellitus    type II   . Diastolic dysfunction   . Difficulty in walking   . Family history of heart disease   . Generalized muscle weakness   . Gout   . High cholesterol   . History of falling   . Hypertension   . Hypothyroidism   . Interstitial lung disease ([place])   .  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    . Morbid obesity ([place])   . Morbid obesity ([place])   . Neuromuscular disorder ([place])    diabetic neuropathy   . Normal coronary arteries    cardiac catheterization performed  10/31/14  . RA (rheumatoid arthritis) ([place])    has been off methotreaxte since 10/13.  [name] Rheumatoid arthritis ([place])   . Shortness of breath   . Spinal stenosis of lumbar region   . Thyroid disease   . Unspecified lack of coordination   . URI (upper respiratory infection)     SOCIAL HX:  Social History   Tobacco Use  . Smoking  status: Never Smoker  . Smokeless tobacco: Never Used  Substance Use Topics  . Alcohol use: No    ALLERGIES:  Allergies  Allergen Reactions  . Codeine Swelling and Other (See Comments)    Facial swelling Chest pain and swelling in legs  Facial swelling  . Infliximab Anaphylaxis    "sent me into shock" "sent me into shock"  . Lisinopril Swelling    Face and neck swelling Face and neck swelling     PERTINENT MEDICATIONS:  Outpatient Encounter Medications as of 01/26/2020  Medication Sig  . acetaminophen (TYLENOL) 500 MG tablet Take 1,000 mg by mouth every 6 (six) hours as needed for moderate pain.   [name] albuterol (PROVENTIL HFA;VENTOLIN HFA) 108 (90 BASE) MCG/ACT inhaler Inhale 2 puffs into the lungs every 6 (six) hours as needed for wheezing or shortness of breath.  . allopurinol (ZYLOPRIM) 300 MG tablet Take 300 mg by mouth daily.  [name] aspirin buffered (BUFFERIN) 325 MG TABS tablet Take 325 mg by mouth daily.  [name] atorvastatin (LIPITOR) 20 MG tablet Take 10 mg by mouth every evening.   . Calcium Carb-Cholecalciferol (CALCIUM + D3 PO) Take 2 tablets by mouth daily with lunch.   . cholecalciferol (VITAMIN D) 1000 UNITS tablet Take 2,000 Units by mouth daily with lunch.   . colchicine 0.6 MG tablet Take 0.6 mg by mouth daily.  . cyclobenzaprine (FLEXERIL) 5 MG tablet Take 1 tablet (5 mg total) by mouth at bedtime as needed for muscle spasms.  . diclofenac sodium (VOLTAREN) 1 % GEL Apply 2 g topically 3 (three) times daily. Both hands and knees  . DULoxetine (CYMBALTA) 60 MG capsule Take 1 capsule (60 mg total) by mouth daily.  . fluticasone (FLONASE) 50 MCG/ACT nasal spray Place 2 sprays into both nostrils daily.  . furosemide (LASIX) 80 MG tablet Take 80 mg by mouth.  . gabapentin (NEURONTIN) 600 MG tablet Take two in the morning , Two mid- afternoon and two at bedtime (Patient taking differently: Take 1,200 mg by mouth 3 (three) times daily. Take two in the morning , Two mid- afternoon  and two at bedtime)  . guaiFENesin (MUCINEX) 600 MG 12 hr tablet Take 1,200 mg by mouth 2 (two) times daily.  . hydroxychloroquine (PLAQUENIL) 200 MG tablet Take 400 mg by mouth daily.   [name] ipratropium-albuterol (DUONEB) 0.5-2.5 (3) MG/3ML SOLN Take 3 mLs by nebulization every 6 (six) hours as needed (cough/wheezing).   [name] leflunomide (ARAVA) 10 MG tablet Take 10 mg by mouth daily.  [name] levothyroxine (SYNTHROID) 50 MCG tablet Take 1 tablet (50 mcg total) by mouth daily.  [name] linaclotide (LINZESS) 72 MCG capsule Take 1 capsule (72 mcg total) by mouth daily before breakfast.  . metolazone (ZAROXOLYN) 2.5 MG  tablet TAKE 1 TABLET BY MOUTH DAILY AS NEEDED FOR 2 DAYS FOR WEIGHT ABOVE 255 POUNDS  . metoprolol succinate (TOPROL XL) 25 MG 24 hr tablet Take 3 tablets (75 mg total) by mouth daily.  . Multiple Vitamin (MULTIVITAMIN WITH MINERALS) TABS Take 1 tablet by mouth daily.   [name] olmesartan (BENICAR) 40 MG tablet Take 1 tablet (40 mg total) by mouth daily.  . OXYGEN-HELIUM IN Inhale 2 L into the lungs at bedtime. And on exertion  . pantoprazole (PROTONIX) 40 MG tablet take 1 tablet by mouth once daily MINUTES BEFORE 1ST MEAL OF THE DAY  . potassium chloride SA (KLOR-CON) 20 MEQ tablet Take 1 tablet (20 mEq total) by mouth daily.  . predniSONE (DELTASONE) 5 MG tablet Take 10 mg by mouth every morning.   . Semaglutide,0.25 or 0.5MG/DOS, (OZEMPIC, 0.25 OR 0.5 MG/DOSE,) 2 MG/1.5ML SOPN Inject 0.375 mLs (0.5 mg total) into the skin once a week.  . sodium chloride (OCEAN) 0.65 % SOLN nasal spray Place 1 spray into both nostrils as needed for congestion.  [name] Spacer/Aero-Holding Chambers (AEROCHAMBER MV) inhaler Use as instructed  . tapentadol (NUCYNTA) 50 MG tablet Take 1 tablet (50 mg total) by mouth every 6 (six) hours as needed.   No facility-administered encounter medications on file as of 01/26/2020.    PHYSICAL EXAM:   General: pleasant female, generalized weakness Cardiovascular: regular rate and  rhythm Pulmonary: decreased bases with fine crackles Extremities: +BLE edema, no joint deformities Neurological: walks with walker  Lucile Hillmann Ihor Gully, NP

## 2020-01-26 NOTE — Telephone Encounter (Signed)
Patient called she wanted to let Dr.Newton know that's he has pain in the groin area she doesn't have shooting pain anymore in her back she feels well. Call back:[phone]

## 2020-01-26 NOTE — Telephone Encounter (Signed)
FYI.

## 2020-01-27 NOTE — Telephone Encounter (Signed)
Scheduled for 9/28 at 1400 with driver.

## 2020-01-27 NOTE — Telephone Encounter (Signed)
Can repeat when needed, can follow with [facility] as well. She see Naaman Plummer for chronic pain.

## 2020-01-27 NOTE — Telephone Encounter (Signed)
Ok to repeat

## 2020-01-27 NOTE — Telephone Encounter (Signed)
Patient is requesting another injection due to the continued pain in her groin. Please advise.

## 2020-02-06 ENCOUNTER — Other Ambulatory Visit: Payer: Self-pay

## 2020-02-06 ENCOUNTER — Ambulatory Visit: Payer: Medicare Other | Admitting: Physical Therapy

## 2020-02-06 ENCOUNTER — Encounter: Payer: Self-pay | Admitting: Physical Therapy

## 2020-02-06 DIAGNOSIS — R293 Abnormal posture: Secondary | ICD-10-CM | POA: Diagnosis not present

## 2020-02-06 DIAGNOSIS — M79605 Pain in left leg: Secondary | ICD-10-CM

## 2020-02-06 DIAGNOSIS — R2689 Other abnormalities of gait and mobility: Secondary | ICD-10-CM | POA: Diagnosis not present

## 2020-02-06 DIAGNOSIS — M545 Low back pain, unspecified: Secondary | ICD-10-CM

## 2020-02-06 DIAGNOSIS — M5442 Lumbago with sciatica, left side: Secondary | ICD-10-CM | POA: Diagnosis not present

## 2020-02-06 DIAGNOSIS — M25552 Pain in left hip: Secondary | ICD-10-CM | POA: Diagnosis not present

## 2020-02-06 DIAGNOSIS — G8929 Other chronic pain: Secondary | ICD-10-CM | POA: Diagnosis not present

## 2020-02-06 NOTE — Therapy (Signed)
[facility] [address], [place], [place], [address] Phone: [phone]   Fax:  [phone]  Physical Therapy Treatment  Patient Details  Name: [name] MRN: [id number] Date of Birth: October 17, 1940 Referring Provider (PT): Isaac Bliss, Rayford Halsted, MD   Encounter Date: 02/06/2020   PT End of Session - 02/06/20 1107    Visit Number 4    Date for PT Re-Evaluation 02/20/20    Authorization Type Medicare - KX at visit 15    PT Start Time 1105    PT Stop Time 1145    PT Time Calculation (min) 40 min    Activity Tolerance Patient limited by pain;Patient tolerated treatment well    Behavior During Therapy [facility] for tasks assessed/performed           Past Medical History:  Diagnosis Date  . Acute on chronic diastolic (congestive) heart failure ([place])   . Anemia    iron deficiency anemia - secondary to blood loss ( chronic)   . Arthritis    endstage changes bilateral knees/bilateral ankles.   . Asthma   . Carotid artery occlusion   . Chronic fatigue   . Chronic kidney disease   . Closed left hip fracture ([place])   . Clotting disorder ([place])    pt denies this  . Contusion of left knee    due to fall 1/14.  [name] COPD (chronic obstructive pulmonary disease) (HCC)    pulmonary fibrosis  . Depression, reactive   . Diabetes mellitus    type II   . Diastolic dysfunction   . Difficulty in walking   . Family history of heart disease   . Generalized muscle weakness   . Gout   . High cholesterol   . History of falling   . Hypertension   . Hypothyroidism   . Interstitial lung disease ([place])   . 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    . Morbid obesity ([place])   . Morbid obesity ([place])   . Neuromuscular disorder ([place])    diabetic neuropathy   . Normal coronary arteries    cardiac catheterization performed  10/31/14  . RA  (rheumatoid arthritis) ([place])    has been off methotreaxte since 10/13.  [name] Rheumatoid arthritis ([place])   . Shortness of breath   . Spinal stenosis of lumbar region   . Thyroid disease   . Unspecified lack of coordination   . URI (upper respiratory infection)     Past Surgical History:  Procedure Laterality Date  . ABDOMINAL HYSTERECTOMY    . BRAIN SURGERY     Gamma knife 10/13. Needs repeat spring  '14  . CARDIAC CATHETERIZATION N/A 10/31/2014   Procedure: Right/Left Heart Cath and Coronary Angiography;  Surgeon: [name], MD;  Location: [place] [facility];  Service: Cardiovascular;  Laterality: N/A;  . ESOPHAGOGASTRODUODENOSCOPY (EGD) WITH PROPOFOL N/A 09/14/2014   Procedure: ESOPHAGOGASTRODUODENOSCOPY (EGD) WITH PROPOFOL;  Surgeon: [name], MD;  [facility];  Service: Endoscopy;  Laterality: N/A;  . INCISION AND DRAINAGE HIP Left 01/16/2017   Procedure: IRRIGATION AND DEBRIDEMENT LEFT HIP;  Surgeon: [name], MD;  [facility];  Service: Orthopedics;  Laterality: Left;  . INTRAMEDULLARY (IM) NAIL INTERTROCHANTERIC Left 11/29/2016   Procedure: INTRAMEDULLARY (IM) NAIL INTERTROCHANTRIC;  Surgeon: [name], MD;  Location: [facility];  Service: Orthopedics;  Laterality: Left;  .  OVARY SURGERY    . SHOULDER SURGERY Left   . TONSILLECTOMY  age 27  . VIDEO BRONCHOSCOPY Bilateral 05/31/2013   Procedure: VIDEO BRONCHOSCOPY WITHOUT FLUORO;  Surgeon: Brand Males, MD;  Location: [place];  Service: Cardiopulmonary;  Laterality: Bilateral;  . video bronscoscopy  2000   lung    There were no vitals filed for this visit.   Subjective Assessment - 02/06/20 1108    Subjective The last injection pretty much took away the sharp. shooting pain. What remains is a constant Lt hip to knee pain that worsens upon standing up straight or laying in bed.    Pertinent History PMH: RA, DM, OA bil knees, lumbar stenosis, PSH: Lt femur ORIF 12/2016      Limitations Standing;Walking    How long can you sit comfortably? better in sitting    Currently in Pain? Yes    Pain Score 7     Pain Location --   Lt hip to knee   Pain Orientation Left    Pain Descriptors / Indicators Stabbing;Constant    Aggravating Factors  standing up, laying down    Pain Relieving Factors sitting    Multiple Pain Sites No                             [facility] Adult PT Treatment/Exercise - 02/06/20 0001      Lumbar Exercises: Aerobic   Nustep L1 x 10 min PTA present       Knee/Hip Exercises: Seated   Long Arc Quad AROM;Strengthening;Both;1 set;10 reps    Sit to [employer] 2 sets;5 reps;with UE support   20% UE, 80% LE     Shoulder Exercises: Seated   Horizontal ABduction Strengthening;Both;Theraband;10 reps    Theraband Level (Shoulder Horizontal ABduction) Level 1 (Yellow)    PTA re-ed on proper form.                   PT Short Term Goals - 01/16/20 1239      PT SHORT TERM GOAL #1   Title Pt will be ind with initial HEP    Status Achieved             PT Long Term Goals - 12/26/19 1313      PT LONG TERM GOAL #1   Title Pt will be able to tolerate ambulation with rollator x 240 feet for short community outings to MD visits or grocery store.    Baseline -    Time 8    Period Weeks    Status New    Target Date 02/20/20      PT LONG TERM GOAL #2   Title Pt will be able to tolerate light standing household tasks for up to 10 min before needing seated break to demo improved endurance.    Baseline -    Time 8    Period Weeks    Status New    Target Date 02/20/20      PT LONG TERM GOAL #3   Title Pt will demo trunk ROM WFL for improved ease for self-care tasks such as bathing and dressing.    Baseline -    Time 8    Period Weeks    Status New    Target Date 02/20/20      PT LONG TERM GOAL #4   Title Pt will report improved LBP and Lt LE pain by at least 50% with transfers  and ADLs    Baseline -    Time 8     Period Weeks    Status New    Target Date 02/20/20      PT LONG TERM GOAL #5   Title Pt will achieve LE strength of at least 4+/5 throughout bil LEs for improved transfers and gait    Baseline -    Time 8    Period Weeks    Status New    Target Date 02/20/20                 Plan - 02/06/20 1107    Clinical Impression Statement Pt arrives ambulating very flexed over her walker. She does report her "shooting" pain has abolished since her last injection a few weeks ago. She continiues to have constant pain from LT hip to knee that makes standing up very difficult. Pt is compliant with new tband exercises at home and required some re-instruction with horizontal abd. Pt was able to perfrom sit to stand using her LE 80% and did not have to use the black pad .    Personal Factors and Comorbidities Age;Time since onset of injury/illness/exacerbation;Comorbidity 1;Comorbidity 2;Comorbidity 3+    Comorbidities obesity, RA, DM, lumbar stenosis, Lt femur ORIF    Examination-Activity Limitations Locomotion Level;Transfers;Bed Mobility;Reach Overhead;Bend;Toileting;Lift;Stand;Dressing;Squat;Sleep    Examination-Participation Restrictions Community Activity;Driving;Meal Prep;Laundry;Shop    Stability/Clinical Decision Making Stable/Uncomplicated    Rehab Potential Good    PT Frequency 2x / week    PT Duration 12 weeks    PT Treatment/Interventions ADLs/Self Care Home Management;Aquatic Therapy;Electrical Stimulation;Cryotherapy;Moist Heat;DME Instruction;Gait training;Functional mobility training;Therapeutic activities;Therapeutic exercise;Balance training;Neuromuscular re-education;Manual techniques;Wheelchair mobility training;Patient/family education;Passive range of motion;Energy conservation;Spinal Manipulations;Joint Manipulations    PT Next Visit Plan review HEP, bed mobility, endurance, sit to stand from black pad    PT Home Exercise Plan Access Code [email or web address]    Consulted and Agree with  Plan of Care Family member/caregiver           Patient will benefit from skilled therapeutic intervention in order to improve the following deficits and impairments:  Abnormal gait, Decreased range of motion, Difficulty walking, Obesity, Increased muscle spasms, Decreased activity tolerance, Pain, Improper body mechanics, Postural dysfunction, Decreased strength, Decreased mobility  Visit Diagnosis: Chronic left-sided low back pain with left-sided sciatica  Pain in left hip  Other abnormalities of gait and mobility  Abnormal posture  Pain in left leg  Chronic bilateral low back pain without sciatica     Problem List Patient Active Problem List   Diagnosis Date Noted  . Diabetic neuropathy ([place]) 05/24/2019  . Sepsis secondary to UTI ([place]) 04/25/2019  . Cellulitis in diabetic foot ([place]) 04/25/2019  . Hypokalemia 04/21/2019  . Anticoagulated 06/25/2018  . Chronic pain 06/25/2018  . CRI (chronic renal insufficiency), stage 3 (moderate) 06/25/2018  . Drug-induced constipation 06/18/2018  . DVT (deep venous thrombosis) ([place]) 06/03/2018  . Syncope and collapse 06/02/2018  . Chest pain 06/02/2018  . Primary osteoarthritis of both knees 05/26/2018  . Body mass index (BMI) of 50-59.9 in adult ([place]) 05/26/2018  . Acquired hypothyroidism 05/26/2018  . Chronic bilateral low back pain without sciatica 02/17/2018  . Astigmatism with presbyopia, bilateral 03/26/2017  . Cortical age-related cataract of both eyes 03/26/2017  . Family history of glaucoma 03/26/2017  . Long term current use of oral hypoglycemic drug 03/26/2017  . Nuclear sclerotic cataract of both eyes 03/26/2017  . Pain in left hip 02/18/2017  . Type 2 diabetes mellitus ([place])  01/16/2017  . Closed nondisplaced intertrochanteric fracture of left femur with delayed healing 01/14/2017  . Postoperative wound infection 01/14/2017  . Gout 11/29/2016  . Depression 11/29/2016  . Closed left hip fracture ([place]) 11/29/2016  .  GERD (gastroesophageal reflux disease) 11/29/2016  . Closed intertrochanteric fracture of hip, left, initial encounter ([place]) 11/29/2016  . Physical deconditioning 07/31/2015  . Pulmonary fibrosis, postinflammatory ([place]) 07/31/2015  . Bronchiectasis without complication ([place]) 39/58/4417  . Tendinitis of left rotator cuff 06/13/2015  . Acute on chronic diastolic heart failure ([place]) 04/21/2015  . Acute kidney injury superimposed on chronic kidney disease ([place]) 04/17/2015  . Hypotension 04/17/2015  . Chronic respiratory failure with hypoxia ([place]) 02/09/2015  . Dyspnea and respiratory abnormality 01/03/2015  . Normal coronary arteries 11/02/2014  . Morbid obesity ([place]) 10/30/2014  . Dysphagia 08/21/2014  . Chronic fatigue 06/21/2014  . DOE (dyspnea on exertion) 05/24/2014  . Primary osteoarthritis of left knee 04/26/2014  . High risk medication use 04/17/2014  . Aphasia 02/12/2014  . 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 ([place]) 01/18/2014  . Lumbar facet arthropathy 01/18/2014  . Bilateral edema of lower extremity 05/30/2013  . Chronic cough 05/24/2013  . ILD (interstitial lung disease) ([place]) 04/10/2013  . Greater trochanteric bursitis of left hip 08/13/2012  . Spinal stenosis of lumbar region with radiculopathy 07/12/2012  . Inability to walk 07/12/2012  . Asymptomatic bacteriuria 07/12/2012  . Meningioma of left sphenoid wing involving cavernous sinus ([place]) 02/17/2012  . Chest pain of uncertain etiology 12/78/7183  . Rheumatoid arthritis ([place]) 01/28/2012  . Primary osteoarthritis of right knee 01/28/2012  . Dyslipidemia   . Thyroid disease   . Essential hypertension     Serenitie Vinton, PTA 02/06/2020, 11:46 AM  [facility] [address] [place], [place], [address] Phone: [phone]   Fax:  [phone]  Name: [name] MRN: [id number] Date of Birth: 09/17/1940

## 2020-02-08 ENCOUNTER — Ambulatory Visit: Payer: Medicare Other | Admitting: Physical Therapy

## 2020-02-08 ENCOUNTER — Other Ambulatory Visit: Payer: Self-pay

## 2020-02-08 ENCOUNTER — Encounter: Payer: Self-pay | Admitting: Physical Therapy

## 2020-02-08 DIAGNOSIS — R2689 Other abnormalities of gait and mobility: Secondary | ICD-10-CM | POA: Diagnosis not present

## 2020-02-08 DIAGNOSIS — M79605 Pain in left leg: Secondary | ICD-10-CM

## 2020-02-08 DIAGNOSIS — G8929 Other chronic pain: Secondary | ICD-10-CM

## 2020-02-08 DIAGNOSIS — M25552 Pain in left hip: Secondary | ICD-10-CM

## 2020-02-08 DIAGNOSIS — M5442 Lumbago with sciatica, left side: Secondary | ICD-10-CM | POA: Diagnosis not present

## 2020-02-08 DIAGNOSIS — R293 Abnormal posture: Secondary | ICD-10-CM

## 2020-02-08 NOTE — Therapy (Signed)
[facility] [address], [place], [place], [address] Phone: [phone]   Fax:  [phone]  Physical Therapy Treatment  Patient Details  Name: [name] MRN: [id number] Date of Birth: Apr 23, 1941 Referring Provider (PT): Isaac Bliss, Rayford Halsted, MD   Encounter Date: 02/08/2020   PT End of Session - 02/08/20 1027    Visit Number 5    Date for PT Re-Evaluation 02/20/20    Authorization Type Medicare - KX at visit 15    PT Start Time 1017    PT Stop Time 1055    PT Time Calculation (min) 38 min    Activity Tolerance Patient tolerated treatment well;Patient limited by pain;Patient limited by fatigue    Behavior During Therapy [facility] for tasks assessed/performed           Past Medical History:  Diagnosis Date   Acute on chronic diastolic (congestive) heart failure (HCC)    Anemia    iron deficiency anemia - secondary to blood loss ( chronic)    Arthritis    endstage changes bilateral knees/bilateral ankles.    Asthma    Carotid artery occlusion    Chronic fatigue    Chronic kidney disease    Closed left hip fracture (HCC)    Clotting disorder ([place])    pt denies this   Contusion of left knee    due to fall 1/14.   COPD (chronic obstructive pulmonary disease) (HCC)    pulmonary fibrosis   Depression, reactive    Diabetes mellitus    type II    Diastolic dysfunction    Difficulty in walking    Family history of heart disease    Generalized muscle weakness    Gout    High cholesterol    History of falling    Hypertension    Hypothyroidism    Interstitial lung disease ([place])    Meningioma of left sphenoid wing involving cavernous sinus ([place]) 02/17/2012   Continue diplopia, left eye pain and left headaches.     Morbid obesity ([place])    Morbid obesity ([place])    Neuromuscular disorder ([place])    diabetic neuropathy    Normal coronary arteries    cardiac catheterization  performed  10/31/14   RA (rheumatoid arthritis) ([place])    has been off methotreaxte since 10/13.   Rheumatoid arthritis (HCC)    Shortness of breath    Spinal stenosis of lumbar region    Thyroid disease    Unspecified lack of coordination    URI (upper respiratory infection)     Past Surgical History:  Procedure Laterality Date   ABDOMINAL HYSTERECTOMY     BRAIN SURGERY     Gamma knife 10/13. Needs repeat spring  '14   CARDIAC CATHETERIZATION N/A 10/31/2014   Procedure: Right/Left Heart Cath and Coronary Angiography;  Surgeon: [name], MD;  Location: [facility];  Service: Cardiovascular;  Laterality: N/A;   ESOPHAGOGASTRODUODENOSCOPY (EGD) WITH PROPOFOL N/A 09/14/2014   Procedure: ESOPHAGOGASTRODUODENOSCOPY (EGD) WITH PROPOFOL;  Surgeon: [name], MD;  [facility];  Service: Endoscopy;  Laterality: N/A;   INCISION AND DRAINAGE HIP Left 01/16/2017   Procedure: IRRIGATION AND DEBRIDEMENT LEFT HIP;  Surgeon: [name], MD;  [facility];  Service: Orthopedics;  Laterality: Left;   INTRAMEDULLARY (IM) NAIL INTERTROCHANTERIC Left 11/29/2016   Procedure: INTRAMEDULLARY (IM) NAIL INTERTROCHANTRIC;  Surgeon: [name], MD;  Location: [facility];  Service: Orthopedics;  Laterality:  Left;   OVARY SURGERY     SHOULDER SURGERY Left    TONSILLECTOMY  age 22   VIDEO BRONCHOSCOPY Bilateral 05/31/2013   Procedure: VIDEO BRONCHOSCOPY WITHOUT FLUORO;  Surgeon: Brand Males, MD;  Location: [facility];  Service: Cardiopulmonary;  Laterality: Bilateral;   video bronscoscopy  2000   lung    There were no vitals filed for this visit.   Subjective Assessment - 02/08/20 1021    Subjective I walked 3x yesterday so am more sore in Lt hip and leg today.  2x household walk and once out to nail salon.  Walking is limited both by getting out of breath and my pain.    Pertinent History PMH: RA, DM, OA bil knees, lumbar stenosis,  PSH: Lt femur ORIF 12/2016    Limitations Standing;Walking    How long can you sit comfortably? better in sitting    How long can you stand comfortably? </= 1 min    How long can you walk comfortably? before flare up could do household ambuation, using walker can now walk from bed to bathroom    Diagnostic tests MRI scheduled for 8/10, 10/2019 lumbar xray diffuse DDD thoracolumbar spine, Gr I anterolisthesis L4/5, scoliosis    Patient Stated Goals learn some exercises that don't cause pain, reduce with pain with activity    Currently in Pain? Yes    Pain Location Buttocks    Pain Orientation Left    Pain Descriptors / Indicators Constant;Stabbing    Pain Type Chronic pain    Pain Radiating Towards Lt groin anterior hip    Pain Onset More than a month ago    Pain Frequency Constant    Aggravating Factors  standing, walking    Pain Relieving Factors sitting                             [facility] Adult PT Treatment/Exercise - 02/08/20 0001      Ambulation/Gait   Gait Comments 34' and 25' with rest in between, Pt with signif Lt LE weakness and cardiovascular limitations      Lumbar Exercises: Seated   Other Seated Lumbar Exercises heel toe raises x20      Knee/Hip Exercises: Seated   Ball Squeeze 10x5 sec with TrA and sit tall cues    Other Seated Knee/Hip Exercises glut squeeze 10x5 sec holds      Shoulder Exercises: Seated   Horizontal ABduction Strengthening;Both;10 reps;Theraband    Theraband Level (Shoulder Horizontal ABduction) Level 1 (Yellow)    Flexion AROM;Both;10 reps    Flexion Limitations for upright posture cueing      Shoulder Exercises: ROM/Strengthening   UBE (Upper Arm Bike) L1 x 4 min alt fwd/bwd with short break at 2 min, PT cued sit tall                    PT Short Term Goals - 02/08/20 1030      PT SHORT TERM GOAL #1   Title Pt will be ind with initial HEP    Status Achieved      PT SHORT TERM GOAL #2   Title Pt will be able to  tolerate light household or self-care standing tasks for up to 3 min before needing seated break, using rollator.    Baseline limited to 1 min    Status On-going      PT SHORT TERM GOAL #3   Title Pt will learn  proper bed mobility with use of core to reduce pain with this funcitonal activity.    Baseline has difficulty getting Lt leg into bed    Status On-going             PT Long Term Goals - 02/08/20 1031      PT LONG TERM GOAL #1   Title Pt will be able to tolerate ambulation with rollator x 240 feet for short community outings to MD visits or grocery store.    Baseline working on household distance frequency, still limited distance secondary to pain and poor endurance    Status On-going      PT LONG TERM GOAL #2   Title Pt will be able to tolerate light standing household tasks for up to 10 min before needing seated break to demo improved endurance.    Status New      PT LONG TERM GOAL #3   Title Pt will demo trunk ROM WFL for improved ease for self-care tasks such as bathing and dressing.    Status On-going      PT LONG TERM GOAL #4   Title Pt will report improved LBP and Lt LE pain by at least 50% with transfers and ADLs    Status On-going      PT LONG TERM GOAL #5   Title Pt will achieve LE strength of at least 4+/5 throughout bil LEs for improved transfers and gait    Status On-going                 Plan - 02/08/20 1046    Clinical Impression Statement Pt continues to be limited in overall gait and mobility tolerance secondary to Lt hip/groin pain, weakness and deconditioning.  PT continues to encourage her to perform higher frequency of household mobility secondary to limited endurance/distance tolerance.  She ambulated x 3 rounds today 60, 42, and 42 feet with walker.  Her Lt LE gives out and her trunk flexes more over walker with increasing ambulation.  Sit to stand is improving for both form and efficiency with cues to use gluts, abs and breath.  Pt will  continue to benefit from skilled PT to improve functional mobility and gait endurance.  Pt sees MD next week.  Her pain is less in lumbar and ongoing in Lt hip and groin since lumbar injection..    Comorbidities obesity, RA, DM, lumbar stenosis, Lt femur ORIF    PT Frequency 2x / week    PT Duration 12 weeks    PT Treatment/Interventions ADLs/Self Care Home Management;Aquatic Therapy;Electrical Stimulation;Cryotherapy;Moist Heat;DME Instruction;Gait training;Functional mobility training;Therapeutic activities;Therapeutic exercise;Balance training;Neuromuscular re-education;Manual techniques;Wheelchair mobility training;Patient/family education;Passive range of motion;Energy conservation;Spinal Manipulations;Joint Manipulations    PT Next Visit Plan continue gait endurance, NuStep and UBE, seated UE/LE/trunk strength    PT Home Exercise Plan Access Code [email or web address]           Patient will benefit from skilled therapeutic intervention in order to improve the following deficits and impairments:     Visit Diagnosis: Chronic left-sided low back pain with left-sided sciatica  Pain in left hip  Other abnormalities of gait and mobility  Abnormal posture  Pain in left leg     Problem List Patient Active Problem List   Diagnosis Date Noted   Diabetic neuropathy ([place]) 05/24/2019   Sepsis secondary to UTI ([place]) 04/25/2019   Cellulitis in diabetic foot ([place]) 04/25/2019   Hypokalemia 04/21/2019   Anticoagulated 06/25/2018   Chronic pain 06/25/2018  CRI (chronic renal insufficiency), stage 3 (moderate) 06/25/2018   Drug-induced constipation 06/18/2018   DVT (deep venous thrombosis) (HCC) 06/03/2018   Syncope and collapse 06/02/2018   Chest pain 06/02/2018   Primary osteoarthritis of both knees 05/26/2018   Body mass index (BMI) of 50-59.9 in adult ([place]) 05/26/2018   Acquired hypothyroidism 05/26/2018   Chronic bilateral low back pain without sciatica 02/17/2018    Astigmatism with presbyopia, bilateral 03/26/2017   Cortical age-related cataract of both eyes 03/26/2017   Family history of glaucoma 03/26/2017   Long term current use of oral hypoglycemic drug 03/26/2017   Nuclear sclerotic cataract of both eyes 03/26/2017   Pain in left hip 02/18/2017   Type 2 diabetes mellitus ([place]) 01/16/2017   Closed nondisplaced intertrochanteric fracture of left femur with delayed healing 01/14/2017   Postoperative wound infection 01/14/2017   Gout 11/29/2016   Depression 11/29/2016   Closed left hip fracture ([place]) 11/29/2016   GERD (gastroesophageal reflux disease) 11/29/2016   Closed intertrochanteric fracture of hip, left, initial encounter ([place]) 11/29/2016   Physical deconditioning 07/31/2015   Pulmonary fibrosis, postinflammatory ([place]) 07/31/2015   Bronchiectasis without complication ([place]) 32/95/1884   Tendinitis of left rotator cuff 06/13/2015   Acute on chronic diastolic heart failure ([place]) 04/21/2015   Acute kidney injury superimposed on chronic kidney disease ([place]) 04/17/2015   Hypotension 04/17/2015   Chronic respiratory failure with hypoxia ([place]) 02/09/2015   Dyspnea and respiratory abnormality 01/03/2015   Normal coronary arteries 11/02/2014   Morbid obesity ([place]) 10/30/2014   Dysphagia 08/21/2014   Chronic fatigue 06/21/2014   DOE (dyspnea on exertion) 05/24/2014   Primary osteoarthritis of left knee 04/26/2014   High risk medication use 04/17/2014   Aphasia 02/12/2014   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 ([place]) 01/18/2014   Lumbar facet arthropathy 01/18/2014   Bilateral edema of lower extremity 05/30/2013   Chronic cough 05/24/2013   ILD (interstitial lung disease) ([place]) 04/10/2013   Greater trochanteric bursitis of left hip 08/13/2012   Spinal stenosis of lumbar region with radiculopathy 07/12/2012   Inability to walk 07/12/2012   Asymptomatic bacteriuria 07/12/2012   Meningioma of  left sphenoid wing involving cavernous sinus ([place]) 02/17/2012   Chest pain of uncertain etiology 16/60/6301   Rheumatoid arthritis ([place]) 01/28/2012   Primary osteoarthritis of right knee 01/28/2012   Dyslipidemia    Thyroid disease    Essential hypertension     Katee Wentland, PT 02/08/20 10:56 AM   [facility] [address], [place], [place], [address] Phone: [phone]   Fax:  [phone]  Name: LONI DELBRIDGE MRN: [id number] Date of Birth: 06-07-1940

## 2020-02-09 DIAGNOSIS — E7849 Other hyperlipidemia: Secondary | ICD-10-CM | POA: Diagnosis not present

## 2020-02-09 DIAGNOSIS — I10 Essential (primary) hypertension: Secondary | ICD-10-CM | POA: Diagnosis not present

## 2020-02-09 DIAGNOSIS — I259 Chronic ischemic heart disease, unspecified: Secondary | ICD-10-CM | POA: Diagnosis not present

## 2020-02-09 DIAGNOSIS — I509 Heart failure, unspecified: Secondary | ICD-10-CM | POA: Diagnosis not present

## 2020-02-09 DIAGNOSIS — Z1379 Encounter for other screening for genetic and chromosomal anomalies: Secondary | ICD-10-CM | POA: Diagnosis not present

## 2020-02-09 DIAGNOSIS — I2510 Atherosclerotic heart disease of native coronary artery without angina pectoris: Secondary | ICD-10-CM | POA: Diagnosis not present

## 2020-02-09 DIAGNOSIS — I4891 Unspecified atrial fibrillation: Secondary | ICD-10-CM | POA: Diagnosis not present

## 2020-02-09 DIAGNOSIS — Z8249 Family history of ischemic heart disease and other diseases of the circulatory system: Secondary | ICD-10-CM | POA: Diagnosis not present

## 2020-02-09 DIAGNOSIS — E7800 Pure hypercholesterolemia, unspecified: Secondary | ICD-10-CM | POA: Diagnosis not present

## 2020-02-09 DIAGNOSIS — I499 Cardiac arrhythmia, unspecified: Secondary | ICD-10-CM | POA: Diagnosis not present

## 2020-02-13 ENCOUNTER — Other Ambulatory Visit: Payer: Self-pay

## 2020-02-13 ENCOUNTER — Encounter: Payer: Self-pay | Admitting: Physical Therapy

## 2020-02-13 ENCOUNTER — Ambulatory Visit: Payer: Medicare Other | Admitting: Physical Therapy

## 2020-02-13 DIAGNOSIS — R293 Abnormal posture: Secondary | ICD-10-CM | POA: Diagnosis not present

## 2020-02-13 DIAGNOSIS — M25552 Pain in left hip: Secondary | ICD-10-CM | POA: Diagnosis not present

## 2020-02-13 DIAGNOSIS — R2689 Other abnormalities of gait and mobility: Secondary | ICD-10-CM | POA: Diagnosis not present

## 2020-02-13 DIAGNOSIS — G8929 Other chronic pain: Secondary | ICD-10-CM

## 2020-02-13 DIAGNOSIS — M5442 Lumbago with sciatica, left side: Secondary | ICD-10-CM | POA: Diagnosis not present

## 2020-02-13 DIAGNOSIS — M79605 Pain in left leg: Secondary | ICD-10-CM | POA: Diagnosis not present

## 2020-02-13 DIAGNOSIS — M545 Low back pain, unspecified: Secondary | ICD-10-CM

## 2020-02-13 NOTE — Therapy (Signed)
[facility] [address], [place], [place], [address] Phone: [phone]   Fax:  [phone]  Physical Therapy Treatment  Patient Details  Name: [name] MRN: [id number] Date of Birth: 25-May-1940 Referring Provider (PT): Isaac Bliss, Rayford Halsted, MD   Encounter Date: 02/13/2020   PT End of Session - 02/13/20 1109    Visit Number 6    Date for PT Re-Evaluation 02/20/20    Authorization Type Medicare - KX at visit 15    PT Start Time 1104    PT Stop Time 1139    PT Time Calculation (min) 35 min    Activity Tolerance Patient tolerated treatment well;Patient limited by pain    Behavior During Therapy --           Past Medical History:  Diagnosis Date  . Acute on chronic diastolic (congestive) heart failure ([place])   . Anemia    iron deficiency anemia - secondary to blood loss ( chronic)   . Arthritis    endstage changes bilateral knees/bilateral ankles.   . Asthma   . Carotid artery occlusion   . Chronic fatigue   . Chronic kidney disease   . Closed left hip fracture ([place])   . Clotting disorder ([place])    pt denies this  . Contusion of left knee    due to fall 1/14.  [name] COPD (chronic obstructive pulmonary disease) (HCC)    pulmonary fibrosis  . Depression, reactive   . Diabetes mellitus    type II   . Diastolic dysfunction   . Difficulty in walking   . Family history of heart disease   . Generalized muscle weakness   . Gout   . High cholesterol   . History of falling   . Hypertension   . Hypothyroidism   . Interstitial lung disease ([place])   . 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    . Morbid obesity ([place])   . Morbid obesity ([place])   . Neuromuscular disorder ([place])    diabetic neuropathy   . Normal coronary arteries    cardiac catheterization performed  10/31/14  . RA (rheumatoid arthritis) ([place])     has been off methotreaxte since 10/13.  [name] Rheumatoid arthritis ([place])   . Shortness of breath   . Spinal stenosis of lumbar region   . Thyroid disease   . Unspecified lack of coordination   . URI (upper respiratory infection)     Past Surgical History:  Procedure Laterality Date  . ABDOMINAL HYSTERECTOMY    . BRAIN SURGERY     Gamma knife 10/13. Needs repeat spring  '14  . CARDIAC CATHETERIZATION N/A 10/31/2014   Procedure: Right/Left Heart Cath and Coronary Angiography;  Surgeon: [name], MD;  Location: [facility];  Service: Cardiovascular;  Laterality: N/A;  . ESOPHAGOGASTRODUODENOSCOPY (EGD) WITH PROPOFOL N/A 09/14/2014   Procedure: ESOPHAGOGASTRODUODENOSCOPY (EGD) WITH PROPOFOL;  Surgeon: [name], MD;  Location: [facility];  Service: Endoscopy;  Laterality: N/A;  . INCISION AND DRAINAGE HIP Left 01/16/2017   Procedure: IRRIGATION AND DEBRIDEMENT LEFT HIP;  Surgeon: [name], MD;  Location: [facility];  Service: Orthopedics;  Laterality: Left;  . INTRAMEDULLARY (IM) NAIL INTERTROCHANTERIC Left 11/29/2016   Procedure: INTRAMEDULLARY (IM) NAIL INTERTROCHANTRIC;  Surgeon: [name], MD;  Location: [facility];  Service: Orthopedics;  Laterality: Left;  . OVARY SURGERY    .  SHOULDER SURGERY Left   . TONSILLECTOMY  age 79  . VIDEO BRONCHOSCOPY Bilateral 05/31/2013   Procedure: VIDEO BRONCHOSCOPY WITHOUT FLUORO;  Surgeon: Brand Males, MD;  Location: [facility];  Service: Cardiopulmonary;  Laterality: Bilateral;  . video bronscoscopy  2000   lung    There were no vitals filed for this visit.   Subjective Assessment - 02/13/20 1106    Subjective Pain is the same, having lumbar injections tomorrow.    Pertinent History PMH: RA, DM, OA bil knees, lumbar stenosis, PSH: Lt femur ORIF 12/2016    Currently in Pain? Yes    Pain Score 7     Pain Location --   Lower back/buttocks   Pain Orientation Left    Pain Descriptors / Indicators  Constant;Stabbing    Aggravating Factors  standing    Pain Relieving Factors sitting    Multiple Pain Sites No                             [facility] Adult PT Treatment/Exercise - 02/13/20 0001      Lumbar Exercises: Aerobic   Nustep L2 x 10 min with PTA presentt to monitor.       Lumbar Exercises: Seated   Other Seated Lumbar Exercises heel toe raises x20      Knee/Hip Exercises: Seated   Ball Squeeze 5 sec hold 2x10    Other Seated Knee/Hip Exercises glut squeeze 10x5 sec holds      Shoulder Exercises: Seated   Row Strengthening;Both;20 reps;Theraband    Theraband Level (Shoulder Row) Level 2 ([place])    Horizontal ABduction Strengthening;Both;10 reps;Theraband    Theraband Level (Shoulder Horizontal ABduction) Level 2 ([place])    Flexion AROM;Both;10 reps   used cane   Flexion Limitations for upright posture cueing   ball b/t knees     Shoulder Exercises: ROM/Strengthening   UBE (Upper Arm Bike) L1.5 2.5x2.5                    PT Short Term Goals - 02/08/20 1030      PT SHORT TERM GOAL #1   Title Pt will be ind with initial HEP    Status Achieved      PT SHORT TERM GOAL #2   Title Pt will be able to tolerate light household or self-care standing tasks for up to 3 min before needing seated break, using rollator.    Baseline limited to 1 min    Status On-going      PT SHORT TERM GOAL #3   Title Pt will learn proper bed mobility with use of core to reduce pain with this funcitonal activity.    Baseline has difficulty getting Lt leg into bed    Status On-going             PT Long Term Goals - 02/08/20 1031      PT LONG TERM GOAL #1   Title Pt will be able to tolerate ambulation with rollator x 240 feet for short community outings to MD visits or grocery store.    Baseline working on household distance frequency, still limited distance secondary to pain and poor endurance    Status On-going      PT LONG TERM GOAL #2   Title Pt will be able  to tolerate light standing household tasks for up to 10 min before needing seated break to demo improved endurance.    Status New  PT LONG TERM GOAL #3   Title Pt will demo trunk ROM WFL for improved ease for self-care tasks such as bathing and dressing.    Status On-going      PT LONG TERM GOAL #4   Title Pt will report improved LBP and Lt LE pain by at least 50% with transfers and ADLs    Status On-going      PT LONG TERM GOAL #5   Title Pt will achieve LE strength of at least 4+/5 throughout bil LEs for improved transfers and gait    Status On-going                 Plan - 02/13/20 1109    Clinical Impression Statement Pt's pain is unchanged. She is scheduled for lumbar injections tomorrow. Pain limits pt ability to stand straighter. Pt did tolerate red band vs yellow, more resistance on both aerobic machines today.    Personal Factors and Comorbidities Age;Time since onset of injury/illness/exacerbation;Comorbidity 1;Comorbidity 2;Comorbidity 3+    Comorbidities obesity, RA, DM, lumbar stenosis, Lt femur ORIF    Examination-Activity Limitations Locomotion Level;Transfers;Bed Mobility;Reach Overhead;Bend;Toileting;Lift;Stand;Dressing;Squat;Sleep    Examination-Participation Restrictions Community Activity;Driving;Meal Prep;Laundry;Shop    Stability/Clinical Decision Making Stable/Uncomplicated    Rehab Potential Good    PT Duration 12 weeks    PT Treatment/Interventions ADLs/Self Care Home Management;Aquatic Therapy;Electrical Stimulation;Cryotherapy;Moist Heat;DME Instruction;Gait training;Functional mobility training;Therapeutic activities;Therapeutic exercise;Balance training;Neuromuscular re-education;Manual techniques;Wheelchair mobility training;Patient/family education;Passive range of motion;Energy conservation;Spinal Manipulations;Joint Manipulations    PT Next Visit Plan See how injections went.    PT Home Exercise Plan Access Code [email or web address]           Patient  will benefit from skilled therapeutic intervention in order to improve the following deficits and impairments:  Abnormal gait, Decreased range of motion, Difficulty walking, Obesity, Increased muscle spasms, Decreased activity tolerance, Pain, Improper body mechanics, Postural dysfunction, Decreased strength, Decreased mobility  Visit Diagnosis: Chronic left-sided low back pain with left-sided sciatica  Pain in left hip  Other abnormalities of gait and mobility  Abnormal posture  Pain in left leg  Chronic bilateral low back pain without sciatica     Problem List Patient Active Problem List   Diagnosis Date Noted  . Diabetic neuropathy ([place]) 05/24/2019  . Sepsis secondary to UTI ([place]) 04/25/2019  . Cellulitis in diabetic foot ([place]) 04/25/2019  . Hypokalemia 04/21/2019  . Anticoagulated 06/25/2018  . Chronic pain 06/25/2018  . CRI (chronic renal insufficiency), stage 3 (moderate) 06/25/2018  . Drug-induced constipation 06/18/2018  . DVT (deep venous thrombosis) ([place]) 06/03/2018  . Syncope and collapse 06/02/2018  . Chest pain 06/02/2018  . Primary osteoarthritis of both knees 05/26/2018  . Body mass index (BMI) of 50-59.9 in adult ([place]) 05/26/2018  . Acquired hypothyroidism 05/26/2018  . Chronic bilateral low back pain without sciatica 02/17/2018  . Astigmatism with presbyopia, bilateral 03/26/2017  . Cortical age-related cataract of both eyes 03/26/2017  . Family history of glaucoma 03/26/2017  . Long term current use of oral hypoglycemic drug 03/26/2017  . Nuclear sclerotic cataract of both eyes 03/26/2017  . Pain in left hip 02/18/2017  . Type 2 diabetes mellitus ([place]) 01/16/2017  . Closed nondisplaced intertrochanteric fracture of left femur with delayed healing 01/14/2017  . Postoperative wound infection 01/14/2017  . Gout 11/29/2016  . Depression 11/29/2016  . Closed left hip fracture ([place]) 11/29/2016  . GERD (gastroesophageal reflux disease) 11/29/2016  . Closed  intertrochanteric fracture of hip, left, initial encounter ([place]) 11/29/2016  . Physical  deconditioning 07/31/2015  . Pulmonary fibrosis, postinflammatory ([place]) 07/31/2015  . Bronchiectasis without complication ([place]) 78/47/8412  . Tendinitis of left rotator cuff 06/13/2015  . Acute on chronic diastolic heart failure ([place]) 04/21/2015  . Acute kidney injury superimposed on chronic kidney disease ([place]) 04/17/2015  . Hypotension 04/17/2015  . Chronic respiratory failure with hypoxia ([place]) 02/09/2015  . Dyspnea and respiratory abnormality 01/03/2015  . Normal coronary arteries 11/02/2014  . Morbid obesity ([place]) 10/30/2014  . Dysphagia 08/21/2014  . Chronic fatigue 06/21/2014  . DOE (dyspnea on exertion) 05/24/2014  . Primary osteoarthritis of left knee 04/26/2014  . High risk medication use 04/17/2014  . Aphasia 02/12/2014  . 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 ([place]) 01/18/2014  . Lumbar facet arthropathy 01/18/2014  . Bilateral edema of lower extremity 05/30/2013  . Chronic cough 05/24/2013  . ILD (interstitial lung disease) ([place]) 04/10/2013  . Greater trochanteric bursitis of left hip 08/13/2012  . Spinal stenosis of lumbar region with radiculopathy 07/12/2012  . Inability to walk 07/12/2012  . Asymptomatic bacteriuria 07/12/2012  . Meningioma of left sphenoid wing involving cavernous sinus ([place]) 02/17/2012  . Chest pain of uncertain etiology 82/12/1386  . Rheumatoid arthritis ([place]) 01/28/2012  . Primary osteoarthritis of right knee 01/28/2012  . Dyslipidemia   . Thyroid disease   . Essential hypertension     Simren Popson, PTA 02/13/2020, 11:40 AM  [facility] [address], [place], [place], [address] Phone: [phone]   Fax:  [phone]  Name: LAKELYN STRAUS MRN: [id number] Date of Birth: 02/02/1941

## 2020-02-14 ENCOUNTER — Ambulatory Visit: Payer: Self-pay

## 2020-02-14 ENCOUNTER — Encounter: Payer: Self-pay | Admitting: Physical Medicine and Rehabilitation

## 2020-02-14 ENCOUNTER — Ambulatory Visit: Payer: Medicare Other | Admitting: Physical Medicine and Rehabilitation

## 2020-02-14 ENCOUNTER — Other Ambulatory Visit: Payer: Self-pay

## 2020-02-14 VITALS — BP 107/73 | HR 93

## 2020-02-14 DIAGNOSIS — M5116 Intervertebral disc disorders with radiculopathy, lumbar region: Secondary | ICD-10-CM

## 2020-02-14 DIAGNOSIS — M48061 Spinal stenosis, lumbar region without neurogenic claudication: Secondary | ICD-10-CM

## 2020-02-14 MED ORDER — DEXAMETHASONE SODIUM PHOSPHATE 10 MG/ML IJ SOLN
15.0000 mg | Freq: Once | INTRAMUSCULAR | Status: DC
Start: 2020-02-14 — End: 2020-03-27

## 2020-02-14 NOTE — Progress Notes (Signed)
Pt state Lower back. Pt state walking and bending makes the pain worse. Pt state to use heating pads, excise and pain meds helps ease the pain. Pt has hx of inj on 01/05/20 pt state it was good, she stop having shooting pain. Pt state lower back left side still gives her pain.  Numeric Pain Rating Scale and Functional Assessment Average Pain 4   In the last MONTH (on 0-10 scale) has pain interfered with the following?  1. General activity like being  able to carry out your everyday physical activities such as walking, climbing stairs, carrying groceries, or moving a chair?  Rating(7)   +Driver, -BT, -Dye Allergies.

## 2020-02-15 ENCOUNTER — Encounter: Payer: Medicare Other | Admitting: Physical Therapy

## 2020-02-15 ENCOUNTER — Ambulatory Visit: Payer: Medicare Other | Admitting: Cardiology

## 2020-02-16 NOTE — Procedures (Signed)
Lumbosacral Transforaminal Epidural Steroid Injection - Sub-Pedicular Approach with Fluoroscopic Guidance  Patient: [name]      Date of Birth: [birth year] MRN: [id number] PCP: [name], MD      Visit Date: 02/14/2020   Universal Protocol:    Date/Time: 02/14/2020  Consent Given By: the patient  Position: PRONE  Additional Comments: Vital signs were monitored before and after the procedure. Patient was prepped and draped in the usual sterile fashion. The correct patient, procedure, and site was verified.   Injection Procedure Details:  Procedure Site One Meds Administered:  Meds ordered this encounter  Medications  . dexamethasone (DECADRON) injection 15 mg    Laterality: Left  Location/Site:  L2-L3 L3-L4  Needle size: 22 G  Needle type: Spinal  Needle Placement: Transforaminal  Findings:    -Comments: Excellent flow of contrast along the nerve, nerve root and into the epidural space.  Procedure Details: After squaring off the end-plates to get a true AP view, the C-arm was positioned so that an oblique view of the foramen as noted above was visualized. The target area is just inferior to the "nose of the scotty dog" or sub pedicular. The soft tissues overlying this structure were infiltrated with 2-3 ml. of 1% Lidocaine without Epinephrine.  The spinal needle was inserted toward the target using a "trajectory" view along the fluoroscope beam.  Under AP and lateral visualization, the needle was advanced so it did not puncture dura and was located close the 6 O'Clock position of the pedical in AP tracterory. Biplanar projections were used to confirm position. Aspiration was confirmed to be negative for CSF and/or blood. A 1-2 ml. volume of Isovue-250 was injected and flow of contrast was noted at each level. Radiographs were obtained for documentation purposes.   After attaining the desired flow of contrast documented above, a 0.5 to 1.0 ml test dose of  0.25% Marcaine was injected into each respective transforaminal space.  The patient was observed for 90 seconds post injection.  After no sensory deficits were reported, and normal lower extremity motor function was noted,   the above injectate was administered so that equal amounts of the injectate were placed at each foramen (level) into the transforaminal epidural space.   Additional Comments:  The patient tolerated the procedure well Dressing: 2 x 2 sterile gauze and Band-Aid    Post-procedure details: Patient was observed during the procedure. Post-procedure instructions were reviewed.  Patient left the clinic in stable condition.

## 2020-02-16 NOTE — Progress Notes (Signed)
[name] - 79 y.o. female MRN [id number]  Date of birth: [birth year]  Office Visit Note: Visit Date: 02/14/2020 PCP: [name], MD Referred by: [name], MD  Subjective: Chief Complaint  Patient presents with  . Lower Back - Pain   HPI:  [name] is a 79 y.o. female who comes in today for planned repeat Left L2-L3 and L3-L4 Lumbar epidural steroid injection with fluoroscopic guidance.  The patient has failed conservative care including home exercise, medications, time and activity modification.  This injection will be diagnostic and hopefully therapeutic.  Please see requesting physician notes for further details and justification. Patient received more than 50% pain relief from prior injection.  She reports her greatly improved sharp shooting pain which time which is only present.  She reports some increase in physical function with activities of daily living.  She continues to participate in chronic opioid treatment through Dr. Naaman Plummer.  Referring: Bevely Palmer Persons, PA-C   ROS Otherwise per HPI.  Assessment & Plan: Visit Diagnoses:  1. Spinal stenosis of lumbar region with radiculopathy   2. Radiculopathy due to lumbar intervertebral disc disorder     Plan: No additional findings.   Meds & Orders:  Meds ordered this encounter  Medications  . dexamethasone (DECADRON) injection 15 mg    Orders Placed This Encounter  Procedures  . XR C-ARM NO REPORT  . Epidural Steroid injection    Follow-up: No follow-ups on file.   Procedures: No procedures performed  Lumbosacral Transforaminal Epidural Steroid Injection - Sub-Pedicular Approach with Fluoroscopic Guidance  Patient: [name]      Date of Birth: 11-05-1940 MRN: [id number] PCP: [name], MD      Visit Date: 02/14/2020   Universal Protocol:    Date/Time: 02/14/2020  Consent Given By: the patient  Position: PRONE  Additional Comments: Vital signs were monitored before  and after the procedure. Patient was prepped and draped in the usual sterile fashion. The correct patient, procedure, and site was verified.   Injection Procedure Details:  Procedure Site One Meds Administered:  Meds ordered this encounter  Medications  . dexamethasone (DECADRON) injection 15 mg    Laterality: Left  Location/Site:  L2-L3 L3-L4  Needle size: 22 G  Needle type: Spinal  Needle Placement: Transforaminal  Findings:    -Comments: Excellent flow of contrast along the nerve, nerve root and into the epidural space.  Procedure Details: After squaring off the end-plates to get a true AP view, the C-arm was positioned so that an oblique view of the foramen as noted above was visualized. The target area is just inferior to the "nose of the scotty dog" or sub pedicular. The soft tissues overlying this structure were infiltrated with 2-3 ml. of 1% Lidocaine without Epinephrine.  The spinal needle was inserted toward the target using a "trajectory" view along the fluoroscope beam.  Under AP and lateral visualization, the needle was advanced so it did not puncture dura and was located close the 6 O'Clock position of the pedical in AP tracterory. Biplanar projections were used to confirm position. Aspiration was confirmed to be negative for CSF and/or blood. A 1-2 ml. volume of Isovue-250 was injected and flow of contrast was noted at each level. Radiographs were obtained for documentation purposes.   After attaining the desired flow of contrast documented above, a 0.5 to 1.0 ml test dose of 0.25% Marcaine was injected into each respective transforaminal space.  The patient was  observed for 90 seconds post injection.  After no sensory deficits were reported, and normal lower extremity motor function was noted,   the above injectate was administered so that equal amounts of the injectate were placed at each foramen (level) into the transforaminal epidural space.   Additional  Comments:  The patient tolerated the procedure well Dressing: 2 x 2 sterile gauze and Band-Aid    Post-procedure details: Patient was observed during the procedure. Post-procedure instructions were reviewed.  Patient left the clinic in stable condition.      Clinical History: Low back with bilateral buttock and leg pain for 3 months. No known injury or prior relevant surgery.  EXAM: MRI LUMBAR SPINE WITHOUT CONTRAST  TECHNIQUE: Multiplanar, multisequence MR imaging of the lumbar spine was performed. No intravenous contrast was administered.  COMPARISON:  Radiographs 11/11/2019.  MRI 07/12/2012.  FINDINGS: Segmentation: Conventional anatomy assumed, with the last open disc space designated L5-S1.Concordant with previous imaging.  Alignment: Mildly progressive degenerative anterolisthesis at L4-5, measuring approximately 4 mm. Otherwise normal.  Vertebrae: No worrisome osseous lesion, acute fracture or pars defect. The lumbar pedicles are short on a congenital basis. There are scattered endplate degenerative changes.  Conus medullaris: Extends to the L1 level and appears normal.  Paraspinal and other soft tissues: There is some atrophy of the erector spinae musculature with nonspecific dependent subcutaneous edema. No significant anterior paraspinal abnormalities.  Disc levels:  T12-L1: Mildly progressive disc bulging eccentric to the left without resulting spinal stenosis or nerve root encroachment. Mild bilateral facet hypertrophy.  L1-2: Progressive loss of disc height with progressive annular disc bulging eccentric to the left. There is a large extruded disc fragment within the left L2 lateral recess extending from the L1-2 disc to the L2-3 disc, favored to arise from the L2-3 level, further described below. There is moderate facet and ligamentous hypertrophy with bilateral facet joint effusions. At the level of the L1-2 disc, there is moderate  spinal stenosis with mild asymmetric narrowing of the left lateral recess and left foramen.  L2-3: Progressive loss of disc height with annular disc bulging and anterior endplate degenerative changes. As above, there is a large extruded disc fragment within the left L2 lateral recess, extending from the L1-2 disc to the L2-3 disc, favored to arise from the L2-3 level. This exerts significant mass effect on the thecal sac and likely results in left-sided nerve root encroachment. There is progressive moderate facet and ligamentous hypertrophy with bilateral facet joint effusions. At the level of the disc, there is moderate foraminal narrowing bilaterally.  L3-4: Mildly progressive loss of disc height with annular disc bulging. Progressive facet and ligamentous hypertrophy with bilateral facet joint effusions. Progressive multifactorial spinal stenosis, now moderate to severe. The foramina appear only mildly narrowed without exiting L3 nerve root encroachment.  L4-5: Similar loss of disc height with annular disc bulging and uncovering related to the degenerative anterolisthesis. There is advanced bilateral facet hypertrophy. These factors contribute to severe multifactorial spinal stenosis, similar to the previous study. There is moderate narrowing of both lateral recesses. There is mild foraminal narrowing, right greater than left.  L5-S1: Disc height and hydration are relatively maintained. The right facet joint appears ankylosed. No significant spinal stenosis or nerve root encroachment.  IMPRESSION: 1. Large extruded disc fragment within the left L2 lateral recess extending from the L1-2 disc to the L2-3 disc, favored to arise from the L2-3 level. This exerts significant mass effect on the thecal sac and likely results in left-sided  nerve root encroachment. 2. Progressive multilevel spondylosis superimposed on a congenitally small spinal canal. There is resulting moderate  spinal stenosis at L1-2 and L2-3, moderate to severe spinal stenosis at L3-4 and severe spinal stenosis at L4-5. 3. Progressive facet disease as described. No acute osseous findings.   Electronically Signed   By: Richardean Sale M.D.   On: 12/28/2019 13:45     Objective:  VS:  HT:    WT:   BMI:     BP:107/73  HR:93bpm  TEMP: ( )  RESP:  Physical Exam Constitutional:      General: She is not in acute distress.    Appearance: Normal appearance. She is obese. She is not ill-appearing.  HENT:     Head: Normocephalic and atraumatic.     Right Ear: External ear normal.     Left Ear: External ear normal.  Eyes:     Extraocular Movements: Extraocular movements intact.  Cardiovascular:     Rate and Rhythm: Normal rate.     Pulses: Normal pulses.  Musculoskeletal:     Right lower leg: No edema.     Left lower leg: No edema.     Comments: Patient has good distal strength with no pain over the greater trochanters.  No clonus or focal weakness.  She has no pain with hip rotation.  She has sitting in wheelchair today in the office  Skin:    Findings: No erythema, lesion or rash.  Neurological:     General: No focal deficit present.     Mental Status: She is alert and oriented to person, place, and time.     Sensory: No sensory deficit.     Motor: No weakness or abnormal muscle tone.     Coordination: Coordination normal.  Psychiatric:        Mood and Affect: Mood normal.        Behavior: Behavior normal.      Imaging: No results found.

## 2020-02-20 ENCOUNTER — Other Ambulatory Visit: Payer: Self-pay

## 2020-02-20 ENCOUNTER — Ambulatory Visit: Payer: Medicare Other | Attending: Internal Medicine | Admitting: Physical Therapy

## 2020-02-20 ENCOUNTER — Encounter: Payer: Self-pay | Admitting: Physical Therapy

## 2020-02-20 DIAGNOSIS — R293 Abnormal posture: Secondary | ICD-10-CM

## 2020-02-20 DIAGNOSIS — M545 Low back pain, unspecified: Secondary | ICD-10-CM

## 2020-02-20 DIAGNOSIS — M5442 Lumbago with sciatica, left side: Secondary | ICD-10-CM | POA: Diagnosis not present

## 2020-02-20 DIAGNOSIS — M79605 Pain in left leg: Secondary | ICD-10-CM | POA: Diagnosis not present

## 2020-02-20 DIAGNOSIS — G8929 Other chronic pain: Secondary | ICD-10-CM

## 2020-02-20 DIAGNOSIS — M25552 Pain in left hip: Secondary | ICD-10-CM

## 2020-02-20 DIAGNOSIS — R2689 Other abnormalities of gait and mobility: Secondary | ICD-10-CM | POA: Diagnosis not present

## 2020-02-20 NOTE — Therapy (Signed)
[facility] [address], [place], [place], [address] Phone: [phone]   Fax:  [phone]  Physical Therapy Treatment  Patient Details  Name: [name] MRN: [id number] Date of Birth: 05-Feb-1941 Referring Provider (PT): Isaac Bliss, Rayford Halsted, MD   Encounter Date: 02/20/2020   PT End of Session - 02/20/20 1026    Visit Number 7    Date for PT Re-Evaluation 02/20/20    Authorization Type Medicare - KX at visit 15    PT Start Time 1019    PT Stop Time 1058    PT Time Calculation (min) 39 min    Activity Tolerance Patient tolerated treatment well;Patient limited by pain    Behavior During Therapy [facility] for tasks assessed/performed           Past Medical History:  Diagnosis Date  . Acute on chronic diastolic (congestive) heart failure ([place])   . Anemia    iron deficiency anemia - secondary to blood loss ( chronic)   . Arthritis    endstage changes bilateral knees/bilateral ankles.   . Asthma   . Carotid artery occlusion   . Chronic fatigue   . Chronic kidney disease   . Closed left hip fracture ([place])   . Clotting disorder ([place])    pt denies this  . Contusion of left knee    due to fall 1/14.  [name] COPD (chronic obstructive pulmonary disease) (HCC)    pulmonary fibrosis  . Depression, reactive   . Diabetes mellitus    type II   . Diastolic dysfunction   . Difficulty in walking   . Family history of heart disease   . Generalized muscle weakness   . Gout   . High cholesterol   . History of falling   . Hypertension   . Hypothyroidism   . Interstitial lung disease ([place])   . 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    . Morbid obesity ([place])   . Morbid obesity ([place])   . Neuromuscular disorder ([place])    diabetic neuropathy   . Normal coronary arteries    cardiac catheterization performed  10/31/14  . RA  (rheumatoid arthritis) ([place])    has been off methotreaxte since 10/13.  [name] Rheumatoid arthritis ([place])   . Shortness of breath   . Spinal stenosis of lumbar region   . Thyroid disease   . Unspecified lack of coordination   . URI (upper respiratory infection)     Past Surgical History:  Procedure Laterality Date  . ABDOMINAL HYSTERECTOMY    . BRAIN SURGERY     Gamma knife 10/13. Needs repeat spring  '14  . CARDIAC CATHETERIZATION N/A 10/31/2014   Procedure: Right/Left Heart Cath and Coronary Angiography;  Surgeon: [name], MD;  Location: [place] [facility];  Service: Cardiovascular;  Laterality: N/A;  . ESOPHAGOGASTRODUODENOSCOPY (EGD) WITH PROPOFOL N/A 09/14/2014   Procedure: ESOPHAGOGASTRODUODENOSCOPY (EGD) WITH PROPOFOL;  Surgeon: [name], MD;  Location: [facility];  Service: Endoscopy;  Laterality: N/A;  . INCISION AND DRAINAGE HIP Left 01/16/2017   Procedure: IRRIGATION AND DEBRIDEMENT LEFT HIP;  Surgeon: [name], MD;  Location: [facility];  Service: Orthopedics;  Laterality: Left;  . INTRAMEDULLARY (IM) NAIL INTERTROCHANTERIC Left 11/29/2016   Procedure: INTRAMEDULLARY (IM) NAIL INTERTROCHANTRIC;  Surgeon: [name], MD;  Location: [place];  Service: Orthopedics;  Laterality: Left;  .  OVARY SURGERY    . SHOULDER SURGERY Left   . TONSILLECTOMY  age 79  . VIDEO BRONCHOSCOPY Bilateral 05/31/2013   Procedure: VIDEO BRONCHOSCOPY WITHOUT FLUORO;  Surgeon: Brand Males, MD;  Location: [place];  Service: Cardiopulmonary;  Laterality: Bilateral;  . video bronscoscopy  2000   lung    There were no vitals filed for this visit.   Subjective Assessment - 02/20/20 1019    Subjective I am sore from the injections. I had two on the Lt lumbar last Tuesday. I am feeling more upright in sitting and I am walking more.    Pertinent History PMH: RA, DM, OA bil knees, lumbar stenosis, PSH: Lt femur ORIF 12/2016    Limitations Standing;Walking    How  long can you sit comfortably? better in sitting    How long can you stand comfortably? </= 1 min    How long can you walk comfortably? before flare up could do household ambuation, using walker can now walk from bed to bathroom    Diagnostic tests MRI scheduled for 8/10, 10/2019 lumbar xray diffuse DDD thoracolumbar spine, Gr I anterolisthesis L4/5, scoliosis    Patient Stated Goals learn some exercises that don't cause pain, reduce with pain with activity    Currently in Pain? Yes    Pain Score 5     Pain Location Buttocks    Pain Orientation Left    Pain Descriptors / Indicators Constant    Pain Type Chronic pain    Pain Onset More than a month ago    Pain Frequency Constant    Aggravating Factors  standing/walking    Pain Relieving Factors sitting                             [facility] Adult PT Treatment/Exercise - 02/20/20 0001      Exercises   Exercises Lumbar;Knee/Hip;Shoulder      Lumbar Exercises: Aerobic   Nustep L2 x 10' PT present to review goals and status since injections last week      Lumbar Exercises: Standing   Other Standing Lumbar Exercises weight shifts side to side and front to back in stagger stance x 5 each, Pt limited cardiovascularly vs pain, needed to sit and catch breath      Lumbar Exercises: Seated   Long Arc Quad on Chair Strengthening;Both;10 reps;Weights;2 sets    LAQ on Chair Weights (lbs) 1.5    Other Seated Lumbar Exercises heel toe raises x20      Knee/Hip Exercises: Standing   Other Standing Knee Exercises countertop toe taps for hip abd alt Rt/Lt x 5 each    Other Standing Knee Exercises countertop toe taps for hip ext alt Rt/Lt x 4 each      Knee/Hip Exercises: Seated   Marching Strengthening;10 reps;Weights;2 sets    Marching Limitations sit tall into small ball at scapulae    Marching Weights 2 lbs.                    PT Short Term Goals - 02/20/20 1026      PT SHORT TERM GOAL #1   Title Pt will be ind with  initial HEP    Status Achieved      PT SHORT TERM GOAL #2   Title Pt will be able to tolerate light household or self-care standing tasks for up to 3 min before needing seated break, using rollator.  Baseline 1.5 min    Status On-going      PT SHORT TERM GOAL #3   Title Pt will learn proper bed mobility with use of core to reduce pain with this funcitonal activity.    Status Achieved             PT Long Term Goals - 02/20/20 1028      PT LONG TERM GOAL #1   Title Pt will be able to tolerate ambulation with rollator x 240 feet for short community outings to MD visits or grocery store.    Baseline working on household distance frequency, still limited distance secondary to pain and poor endurance    Status On-going      PT LONG TERM GOAL #2   Title Pt will be able to tolerate light standing household tasks for up to 10 min before needing seated break to demo improved endurance.    Baseline 1.5 min    Status On-going      PT LONG TERM GOAL #3   Title Pt will demo trunk ROM WFL for improved ease for self-care tasks such as bathing and dressing.    Status On-going      PT LONG TERM GOAL #4   Title Pt will report improved LBP and Lt LE pain by at least 50% with transfers and ADLs    Baseline 30%    Status On-going      PT LONG TERM GOAL #5   Title Pt will achieve LE strength of at least 4+/5 throughout bil LEs for improved transfers and gait    Status On-going                 Plan - 02/20/20 1052    Clinical Impression Statement Pt had injecitons last week.  She reports she is no longer limited functionally by pain but rather cardiovascular endurance and weakness.  She demos improving strength in Lt LE today for abd, flexion and ext but had difficulties with Rt LE.  She reports this is new and secondary to swelling for which she will see her rheumatologist next week.  She is limited in standing to 1.5 min so PT alternated between standing and seated ther ex to nudge  standing challenges.  She was able to perform low reps of weight shifts and toe taps x 3 cycles with seated ther ex in between.  PT encouraged her to continue frequent small duration countertop standing ther ex and household ambulation with seated breaks as needed.  Pt requested d/c to HEP today to continue working toward strength and functional endurance.    Comorbidities obesity, RA, DM, lumbar stenosis, Lt femur ORIF    PT Treatment/Interventions ADLs/Self Care Home Management;Aquatic Therapy;Electrical Stimulation;Cryotherapy;Moist Heat;DME Instruction;Gait training;Functional mobility training;Therapeutic activities;Therapeutic exercise;Balance training;Neuromuscular re-education;Manual techniques;Wheelchair mobility training;Patient/family education;Passive range of motion;Energy conservation;Spinal Manipulations;Joint Manipulations    PT Next Visit Plan d/c to HEP per Pt request    PT Home Exercise Plan Access Code [email or web address]    Consulted and Agree with Plan of Care Patient           Patient will benefit from skilled therapeutic intervention in order to improve the following deficits and impairments:     Visit Diagnosis: Chronic left-sided low back pain with left-sided sciatica  Pain in left hip  Other abnormalities of gait and mobility  Abnormal posture  Pain in left leg  Chronic bilateral low back pain without sciatica     Problem List Patient Active Problem List  Diagnosis Date Noted  . Diabetic neuropathy ([place]) 05/24/2019  . Sepsis secondary to UTI ([place]) 04/25/2019  . Cellulitis in diabetic foot ([place]) 04/25/2019  . Hypokalemia 04/21/2019  . Anticoagulated 06/25/2018  . Chronic pain 06/25/2018  . CRI (chronic renal insufficiency), stage 3 (moderate) ([place]) 06/25/2018  . Drug-induced constipation 06/18/2018  . DVT (deep venous thrombosis) ([place]) 06/03/2018  . Syncope and collapse 06/02/2018  . Chest pain 06/02/2018  . Primary osteoarthritis of both knees 05/26/2018   . Body mass index (BMI) of 50-59.9 in adult ([place]) 05/26/2018  . Acquired hypothyroidism 05/26/2018  . Chronic bilateral low back pain without sciatica 02/17/2018  . Astigmatism with presbyopia, bilateral 03/26/2017  . Cortical age-related cataract of both eyes 03/26/2017  . Family history of glaucoma 03/26/2017  . Long term current use of oral hypoglycemic drug 03/26/2017  . Nuclear sclerotic cataract of both eyes 03/26/2017  . Pain in left hip 02/18/2017  . Type 2 diabetes mellitus ([place]) 01/16/2017  . Closed nondisplaced intertrochanteric fracture of left femur with delayed healing 01/14/2017  . Postoperative wound infection 01/14/2017  . Gout 11/29/2016  . Depression 11/29/2016  . Closed left hip fracture ([place]) 11/29/2016  . GERD (gastroesophageal reflux disease) 11/29/2016  . Closed intertrochanteric fracture of hip, left, initial encounter ([place]) 11/29/2016  . Physical deconditioning 07/31/2015  . Pulmonary fibrosis, postinflammatory ([place]) 07/31/2015  . Bronchiectasis without complication ([place]) 98/72/1587  . Tendinitis of left rotator cuff 06/13/2015  . Acute on chronic diastolic heart failure ([place]) 04/21/2015  . Acute kidney injury superimposed on chronic kidney disease ([place]) 04/17/2015  . Hypotension 04/17/2015  . Chronic respiratory failure with hypoxia ([place]) 02/09/2015  . Dyspnea and respiratory abnormality 01/03/2015  . Normal coronary arteries 11/02/2014  . Morbid obesity ([place]) 10/30/2014  . Dysphagia 08/21/2014  . Chronic fatigue 06/21/2014  . DOE (dyspnea on exertion) 05/24/2014  . Primary osteoarthritis of left knee 04/26/2014  . High risk medication use 04/17/2014  . Aphasia 02/12/2014  . 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 ([place]) 01/18/2014  . Lumbar facet arthropathy 01/18/2014  . Bilateral edema of lower extremity 05/30/2013  . Chronic cough 05/24/2013  . ILD (interstitial lung disease) ([place]) 04/10/2013  . Greater trochanteric bursitis of left hip  08/13/2012  . Spinal stenosis of lumbar region with radiculopathy 07/12/2012  . Inability to walk 07/12/2012  . Asymptomatic bacteriuria 07/12/2012  . Meningioma of left sphenoid wing involving cavernous sinus ([place]) 02/17/2012  . Chest pain of uncertain etiology 27/61/8485  . Rheumatoid arthritis ([place]) 01/28/2012  . Primary osteoarthritis of right knee 01/28/2012  . Dyslipidemia   . Thyroid disease   . Essential hypertension     Jacey Pelc E Yisel Megill 02/20/2020, 11:04 AM   PHYSICAL THERAPY DISCHARGE SUMMARY  Visits from Start of Care: 7  Current functional level related to goals / functional outcomes: See above.  Pt requested d/c to HEP.   Remaining deficits: See above   Education / Equipment: Rollator, HEP Plan: Patient agrees to discharge.  Patient goals were partially met. Patient is being discharged due to the patient's request.  ?????         Baruch Merl, PT 02/20/20 11:05 AM   [facility] [address] [place], [place], [address] Phone: [phone]   Fax:  [phone]  Name: KAYDRA BORGEN MRN: [id number] Date of Birth: 1941-05-11

## 2020-02-21 DIAGNOSIS — Z8249 Family history of ischemic heart disease and other diseases of the circulatory system: Secondary | ICD-10-CM | POA: Diagnosis not present

## 2020-02-21 DIAGNOSIS — I499 Cardiac arrhythmia, unspecified: Secondary | ICD-10-CM | POA: Diagnosis not present

## 2020-02-21 DIAGNOSIS — E7849 Other hyperlipidemia: Secondary | ICD-10-CM | POA: Diagnosis not present

## 2020-02-21 DIAGNOSIS — I2510 Atherosclerotic heart disease of native coronary artery without angina pectoris: Secondary | ICD-10-CM | POA: Diagnosis not present

## 2020-02-21 DIAGNOSIS — I4891 Unspecified atrial fibrillation: Secondary | ICD-10-CM | POA: Diagnosis not present

## 2020-02-21 DIAGNOSIS — I509 Heart failure, unspecified: Secondary | ICD-10-CM | POA: Diagnosis not present

## 2020-02-21 DIAGNOSIS — Z1379 Encounter for other screening for genetic and chromosomal anomalies: Secondary | ICD-10-CM | POA: Diagnosis not present

## 2020-02-21 DIAGNOSIS — E7800 Pure hypercholesterolemia, unspecified: Secondary | ICD-10-CM | POA: Diagnosis not present

## 2020-02-21 DIAGNOSIS — I259 Chronic ischemic heart disease, unspecified: Secondary | ICD-10-CM | POA: Diagnosis not present

## 2020-02-21 DIAGNOSIS — I10 Essential (primary) hypertension: Secondary | ICD-10-CM | POA: Diagnosis not present

## 2020-02-22 ENCOUNTER — Encounter: Payer: Medicare Other | Admitting: Physical Therapy

## 2020-02-27 DIAGNOSIS — M1A00X0 Idiopathic chronic gout, unspecified site, without tophus (tophi): Secondary | ICD-10-CM | POA: Diagnosis not present

## 2020-02-27 DIAGNOSIS — J849 Interstitial pulmonary disease, unspecified: Secondary | ICD-10-CM | POA: Diagnosis not present

## 2020-02-27 DIAGNOSIS — I5032 Chronic diastolic (congestive) heart failure: Secondary | ICD-10-CM | POA: Diagnosis not present

## 2020-02-27 DIAGNOSIS — M19041 Primary osteoarthritis, right hand: Secondary | ICD-10-CM | POA: Diagnosis not present

## 2020-02-27 DIAGNOSIS — Z7952 Long term (current) use of systemic steroids: Secondary | ICD-10-CM | POA: Diagnosis not present

## 2020-02-27 DIAGNOSIS — M19042 Primary osteoarthritis, left hand: Secondary | ICD-10-CM | POA: Diagnosis not present

## 2020-02-27 DIAGNOSIS — M5442 Lumbago with sciatica, left side: Secondary | ICD-10-CM | POA: Diagnosis not present

## 2020-02-27 DIAGNOSIS — M25462 Effusion, left knee: Secondary | ICD-10-CM | POA: Diagnosis not present

## 2020-02-27 DIAGNOSIS — Z23 Encounter for immunization: Secondary | ICD-10-CM | POA: Diagnosis not present

## 2020-02-27 DIAGNOSIS — M25461 Effusion, right knee: Secondary | ICD-10-CM | POA: Diagnosis not present

## 2020-02-27 DIAGNOSIS — M180 Bilateral primary osteoarthritis of first carpometacarpal joints: Secondary | ICD-10-CM | POA: Diagnosis not present

## 2020-02-27 DIAGNOSIS — M0579 Rheumatoid arthritis with rheumatoid factor of multiple sites without organ or systems involvement: Secondary | ICD-10-CM | POA: Diagnosis not present

## 2020-02-27 DIAGNOSIS — M170 Bilateral primary osteoarthritis of knee: Secondary | ICD-10-CM | POA: Diagnosis not present

## 2020-02-27 DIAGNOSIS — G8929 Other chronic pain: Secondary | ICD-10-CM | POA: Diagnosis not present

## 2020-02-27 DIAGNOSIS — Z79899 Other long term (current) drug therapy: Secondary | ICD-10-CM | POA: Diagnosis not present

## 2020-03-15 DIAGNOSIS — Z79899 Other long term (current) drug therapy: Secondary | ICD-10-CM | POA: Diagnosis not present

## 2020-03-15 DIAGNOSIS — H2513 Age-related nuclear cataract, bilateral: Secondary | ICD-10-CM | POA: Diagnosis not present

## 2020-03-15 DIAGNOSIS — E119 Type 2 diabetes mellitus without complications: Secondary | ICD-10-CM | POA: Diagnosis not present

## 2020-03-15 DIAGNOSIS — H524 Presbyopia: Secondary | ICD-10-CM | POA: Diagnosis not present

## 2020-03-15 LAB — HM DIABETES EYE EXAM

## 2020-03-19 ENCOUNTER — Other Ambulatory Visit: Payer: Self-pay

## 2020-03-19 ENCOUNTER — Telehealth: Payer: Self-pay | Admitting: Nurse Practitioner

## 2020-03-19 ENCOUNTER — Other Ambulatory Visit: Payer: Medicare Other | Admitting: Nurse Practitioner

## 2020-03-19 NOTE — Telephone Encounter (Signed)
I called Ms. Schul to confirm appointment for [facility] f/u today, Ms. Rattan requested to reschedule, rescheduled for 04/23/2020 at 11:30,

## 2020-03-21 ENCOUNTER — Ambulatory Visit: Payer: Medicare Other | Admitting: Cardiology

## 2020-03-23 ENCOUNTER — Encounter: Payer: Self-pay | Admitting: Family Medicine

## 2020-03-27 ENCOUNTER — Encounter: Payer: Self-pay | Admitting: Registered Nurse

## 2020-03-27 ENCOUNTER — Other Ambulatory Visit: Payer: Self-pay

## 2020-03-27 ENCOUNTER — Encounter: Payer: Medicare Other | Attending: Physical Medicine & Rehabilitation | Admitting: Registered Nurse

## 2020-03-27 ENCOUNTER — Telehealth: Payer: Self-pay | Admitting: Registered Nurse

## 2020-03-27 VITALS — BP 103/71 | HR 102 | Temp 98.1°F | Ht 64.0 in | Wt 234.4 lb

## 2020-03-27 DIAGNOSIS — G894 Chronic pain syndrome: Secondary | ICD-10-CM | POA: Insufficient documentation

## 2020-03-27 DIAGNOSIS — M778 Other enthesopathies, not elsewhere classified: Secondary | ICD-10-CM | POA: Diagnosis not present

## 2020-03-27 DIAGNOSIS — Z5181 Encounter for therapeutic drug level monitoring: Secondary | ICD-10-CM | POA: Insufficient documentation

## 2020-03-27 DIAGNOSIS — M62838 Other muscle spasm: Secondary | ICD-10-CM | POA: Insufficient documentation

## 2020-03-27 DIAGNOSIS — M2550 Pain in unspecified joint: Secondary | ICD-10-CM | POA: Insufficient documentation

## 2020-03-27 DIAGNOSIS — M7061 Trochanteric bursitis, right hip: Secondary | ICD-10-CM | POA: Diagnosis not present

## 2020-03-27 DIAGNOSIS — M5416 Radiculopathy, lumbar region: Secondary | ICD-10-CM | POA: Diagnosis not present

## 2020-03-27 DIAGNOSIS — M546 Pain in thoracic spine: Secondary | ICD-10-CM | POA: Insufficient documentation

## 2020-03-27 DIAGNOSIS — M48061 Spinal stenosis, lumbar region without neurogenic claudication: Secondary | ICD-10-CM | POA: Diagnosis not present

## 2020-03-27 DIAGNOSIS — G8929 Other chronic pain: Secondary | ICD-10-CM | POA: Insufficient documentation

## 2020-03-27 DIAGNOSIS — M47816 Spondylosis without myelopathy or radiculopathy, lumbar region: Secondary | ICD-10-CM | POA: Insufficient documentation

## 2020-03-27 DIAGNOSIS — M170 Bilateral primary osteoarthritis of knee: Secondary | ICD-10-CM | POA: Diagnosis not present

## 2020-03-27 DIAGNOSIS — M7062 Trochanteric bursitis, left hip: Secondary | ICD-10-CM | POA: Diagnosis not present

## 2020-03-27 DIAGNOSIS — Z79891 Long term (current) use of opiate analgesic: Secondary | ICD-10-CM | POA: Diagnosis not present

## 2020-03-27 MED ORDER — MORPHINE SULFATE ER 15 MG PO TBCR: 15.0000 mg | EXTENDED_RELEASE_TABLET | Freq: Two times a day (BID) | ORAL | 0 refills | Status: DC

## 2020-03-27 NOTE — Telephone Encounter (Signed)
PMP was reviewed. Nucynta destroyed due to adverse effect.  MS Contin was resumed and e-scribed today. Ms. Wyss is aware of the above via My-Chart message.

## 2020-03-27 NOTE — Progress Notes (Signed)
Subjective:    Patient ID: [name], female    DOB: August 28, 1940, 79 y.o.   MRN: [id number]  HPI: [name] is a 79 y.o. female who returns for follow up appointment for chronic pain and medication refill. She states her pain is located in her right shoulder, mid- lower back pain radiating into her bilateral hips L>R and left lower extremity. Also reports bilateral hip pain L>R and bilateral knee pain R>L. [name] reports insomnia with Nucynta,, also states she was receiving about 6 hours of pain relief. Due to her adverse effect we will resume the Joan Contin,she verbalizes understanding. Nucynta destroyed per office policy. [name] daughter will send a My-Chart message with an exact account of [name]  Joan Contin and MSIR she has at her home. Will await message before sending in prescription. She rates her pain 7. Her current exercise regime is walking and performing stretching exercises.  [name] Morphine equivalent is 74.67 MME.  Oral Swab was Performed today.    Pain Inventory Average Pain 6 Pain Right Now 7 My pain is intermittent, sharp, stabbing and tingling  In the last 24 hours, has pain interfered with the following? General activity 8 Relation with others 5 Enjoyment of life 4 What TIME of day is your pain at its worst? morning  Sleep (in general) Poor  Pain is worse with: walking, bending and sitting Pain improves with: rest, heat/ice, therapy/exercise and medication Relief from Meds: 5  Family History  Problem Relation Age of Onset  . Diabetes Mother   . Heart attack Mother   . Hypertension Father   . Lung cancer Father   . Diabetes Sister   . Diabetes Brother   . Hypertension Brother   . Heart disease Brother   . Heart attack Brother   . Kidney cancer Brother   . Uterine cancer Daughter   . Breast cancer Sister   . Rheum arthritis Maternal Uncle   . Gout Brother   . Kidney failure Brother        x 5   Social History    Socioeconomic History  . Marital status: Married    Spouse name: Not on file  . Number of children: 6  . Years of education: college  . Highest education level: Not on file  Occupational History  . Occupation: retired Marine scientist  Tobacco Use  . Smoking status: Never Smoker  . Smokeless tobacco: Never Used  Vaping Use  . Vaping Use: Never used  Substance and Sexual Activity  . Alcohol use: No  . Drug use: No  . Sexual activity: Not Currently  Other Topics Concern  . Not on file  Social History Narrative   Patient consumes 2-3 cups coffee per day.   Social Determinants of Health   Financial Resource Strain:   . Difficulty of Paying Living Expenses: Not on file  Food Insecurity:   . Worried About Charity fundraiser in the Last Year: Not on file  . Ran Out of Food in the Last Year: Not on file  Transportation Needs:   . Lack of Transportation (Medical): Not on file  . Lack of Transportation (Non-Medical): Not on file  Physical Activity:   . Days of Exercise per Week: Not on file  . Minutes of Exercise per Session: Not on file  Stress:   . Feeling of Stress : Not on file  Social Connections:   . Frequency of Communication with Friends and Family: Not  on file  . Frequency of Social Gatherings with Friends and Family: Not on file  . Attends Religious Services: Not on file  . Active Member of Clubs or Organizations: Not on file  . Attends Archivist Meetings: Not on file  . Marital Status: Not on file   Past Surgical History:  Procedure Laterality Date  . ABDOMINAL HYSTERECTOMY    . BRAIN SURGERY     Gamma knife 10/13. Needs repeat spring  '14  . CARDIAC CATHETERIZATION N/A 10/31/2014   Procedure: Right/Left Heart Cath and Coronary Angiography;  Surgeon: [name], MD;  Location: [facility];  Service: Cardiovascular;  Laterality: N/A;  . ESOPHAGOGASTRODUODENOSCOPY (EGD) WITH PROPOFOL N/A 09/14/2014   Procedure: ESOPHAGOGASTRODUODENOSCOPY (EGD)  WITH PROPOFOL;  Surgeon: [name], MD;  Location: [facility];  Service: Endoscopy;  Laterality: N/A;  . INCISION AND DRAINAGE HIP Left 01/16/2017   Procedure: IRRIGATION AND DEBRIDEMENT LEFT HIP;  Surgeon: [name], MD;  Location: [facility];  Service: Orthopedics;  Laterality: Left;  . INTRAMEDULLARY (IM) NAIL INTERTROCHANTERIC Left 11/29/2016   Procedure: INTRAMEDULLARY (IM) NAIL INTERTROCHANTRIC;  Surgeon: [name], MD;  Location: [facility];  Service: Orthopedics;  Laterality: Left;  . OVARY SURGERY    . SHOULDER SURGERY Left   . TONSILLECTOMY  age 38  . VIDEO BRONCHOSCOPY Bilateral 05/31/2013   Procedure: VIDEO BRONCHOSCOPY WITHOUT FLUORO;  Surgeon: Brand Males, MD;  Location: [facility];  Service: Cardiopulmonary;  Laterality: Bilateral;  . video bronscoscopy  2000   lung   Past Surgical History:  Procedure Laterality Date  . ABDOMINAL HYSTERECTOMY    . BRAIN SURGERY     Gamma knife 10/13. Needs repeat spring  '14  . CARDIAC CATHETERIZATION N/A 10/31/2014   Procedure: Right/Left Heart Cath and Coronary Angiography;  Surgeon: [name], MD;  Location: [facility];  Service: Cardiovascular;  Laterality: N/A;  . ESOPHAGOGASTRODUODENOSCOPY (EGD) WITH PROPOFOL N/A 09/14/2014   Procedure: ESOPHAGOGASTRODUODENOSCOPY (EGD) WITH PROPOFOL;  Surgeon: [name], MD;  [facility];  Service: Endoscopy;  Laterality: N/A;  . INCISION AND DRAINAGE HIP Left 01/16/2017   Procedure: IRRIGATION AND DEBRIDEMENT LEFT HIP;  Surgeon: [name], MD;  [facility]: [facility];  Service: Orthopedics;  Laterality: Left;  . INTRAMEDULLARY (IM) NAIL INTERTROCHANTERIC Left 11/29/2016   Procedure: INTRAMEDULLARY (IM) NAIL INTERTROCHANTRIC;  Surgeon: [name], MD;  Location: [facility];  Service: Orthopedics;  Laterality: Left;  . OVARY SURGERY    . SHOULDER SURGERY Left   . TONSILLECTOMY  age 61  . VIDEO BRONCHOSCOPY Bilateral  05/31/2013   Procedure: VIDEO BRONCHOSCOPY WITHOUT FLUORO;  Surgeon: Brand Males, MD;  Location: [facility];  Service: Cardiopulmonary;  Laterality: Bilateral;  . video bronscoscopy  2000   lung   Past Medical History:  Diagnosis Date  . Acute on chronic diastolic (congestive) heart failure ([place])   . Anemia    iron deficiency anemia - secondary to blood loss ( chronic)   . Arthritis    endstage changes bilateral knees/bilateral ankles.   . Asthma   . Carotid artery occlusion   . Chronic fatigue   . Chronic kidney disease   . Closed left hip fracture ([place])   . Clotting disorder ([place])    pt denies this  . Contusion of left knee    due to fall 1/14.  [name] COPD (chronic obstructive pulmonary disease) (HCC)    pulmonary fibrosis  . Depression, reactive   .  Diabetes mellitus    type II   . Diastolic dysfunction   . Difficulty in walking   . Family history of heart disease   . Generalized muscle weakness   . Gout   . High cholesterol   . History of falling   . Hypertension   . Hypothyroidism   . Interstitial lung disease ([place])   . 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    . Morbid obesity ([place])   . Morbid obesity ([place])   . Neuromuscular disorder ([place])    diabetic neuropathy   . Normal coronary arteries    cardiac catheterization performed  10/31/14  . RA (rheumatoid arthritis) ([place])    has been off methotreaxte since 10/13.  [name] Rheumatoid arthritis ([place])   . Shortness of breath   . Spinal stenosis of lumbar region   . Thyroid disease   . Unspecified lack of coordination   . URI (upper respiratory infection)    Wt 234 lb 6.4 oz (106.3 kg)   BMI 40.23 kg/m   Opioid Risk Score:   Fall Risk Score:  `1  Depression screen PHQ 2/9  Depression screen [facility] 2/9 12/14/2019 09/22/2019 06/11/2018 05/26/2018 12/09/2017 10/22/2017 09/22/2017  Decreased Interest _0 0 0  Down, Depressed,  Hopeless _1 0 0  PHQ - 2 Score _2 0 0  Altered sleeping - _3 - - -  Tired, decreased energy - _4 - - -  Change in appetite - _5 - - -  Feeling bad or failure about yourself  - _6 - - -  Trouble concentrating - 2 2 0 - - -  Moving slowly or fidgety/restless - 1 0 2 - - -  Suicidal thoughts - 0 0 0 - - -  PHQ-9 Score - _7 - - -  Difficult doing work/chores - Somewhat difficult - Somewhat difficult - - -  Some recent data might be hidden   Review of Systems  Musculoskeletal: Positive for back pain and gait problem.  All other systems reviewed and are negative.      Objective:   Physical Exam Vitals and nursing note reviewed.  Constitutional:      Appearance: Normal appearance.  Cardiovascular:     Rate and Rhythm: Normal rate and regular rhythm.     Pulses: Normal pulses.     Heart sounds: Normal heart sounds.  Pulmonary:     Effort: Pulmonary effort is normal.     Breath sounds: Normal breath sounds.  Musculoskeletal:     Cervical back: Normal range of motion and neck supple.     Comments: Normal Muscle Bulk and Muscle Testing Reveals:  Upper Extremities: Full ROM and Muscle Strength 5/5 Bilateral AC Joint Tenderness  Thoracic and Lumbar Hypersensitivity Left Greater Trochanter Tenderness Lower Extremities: Right: Decreased ROM and Muscle Strength 5/5 Right Lower Extremity Flexion Produces Pain into her Right Patella Left Lower Extremity: Full ROM and Muscle Strength 5/5 Arrived in wheelchair  Skin:    General: Skin is warm and dry.  Neurological:     Mental Status: She is alert and oriented to person, place, and time.  Psychiatric:        Mood and Affect: Mood normal.        Behavior: Behavior normal.  Assessment & Plan:  1. Lumbar spinal stenosis with neurogenic claudication. Associated facet arthropathy/ Lumbar Radiculitis: Continuecurrent medication regime withGabapentinand HEP as tolerated.03/27/2020 We will  resume Joan. Roemmich Joan Contin 15 mg one tablet every 12 hours #60andMSIR 15 one tablet twice a dayas neededFor break through pain( 6 am and 1:00 pm)#60.since adverse effect with insomnia with Nucynta. Awaiting My-Chart message from her daughter with Medication count. Nucynta discontinued due to insomnia.  We will continue the opioid monitoring program, this consists of regular clinic visits, examinations, urine drug screen, pill counts as well as use of [place] Controlled Substance Reporting system. A 12 month History has been reviewed on the [place] Controlled Substance Reporting System 03/27/2020. 2. Chronic Bilateral Thoracic Back Pain: Continue HEP as Tolerated. Continue to Monitor.  3. Depression: Continuecurrent medication Regime:Cymbalta.Continue to monitor.03/27/2020 4. Diabetes mellitus type 2 with polyneuropathy: Continuecurrent medication regime:Gabapentin.03/27/2020 5. Rheumatoid arthritis and osteoarthritis. Continuecurrent medication Regime.Voltaren Gel. Rheumatology Following.03/27/2020 6. Interstitial lung disease: Pulmonology Following.03/27/2020. 7. Bilateral Osteoarthritis Knee's: Continuecurrent medication regimen.Voltaren Gel.03/27/2020. 8. Muscle Spasm: Continuecurrent medication regime: Flexeril.Continue to monitor.03/27/2020 9.BilateralGreater Trochanteric Tenderness:L>R.Continuecurrent treatmentwith Ice/Heat Therapy.03/27/2020. 10. Polyarthralgia: Rheumatology Following: Continue to monitor.03/27/2020 11. Morbid Obesitity: Continue Healthy Diet Regime and HEP. Continue to monitor.03/27/2020 12. Right Shoulder tendonitis: Continue to Alternate with Heat and Ice Therapy. Continue to monitor.   20 minutes of face to face patient care time was spent during this visit. All questions were encouraged and answered.   F/U in 2 months

## 2020-03-30 ENCOUNTER — Other Ambulatory Visit: Payer: Self-pay | Admitting: Cardiology

## 2020-03-30 LAB — DRUG TOX MONITOR 1 W/CONF, ORAL FLD
Amphetamines: NEGATIVE ng/mL (ref <10)
Barbiturates: NEGATIVE ng/mL (ref <10)
Benzodiazepines: NEGATIVE ng/mL (ref <0.50)
Buprenorphine: NEGATIVE ng/mL (ref <0.10)
Cocaine: NEGATIVE ng/mL (ref <5.0)
Fentanyl: NEGATIVE ng/mL (ref <0.10)
Heroin Metabolite: NEGATIVE ng/mL (ref <1.0)
MARIJUANA: NEGATIVE ng/mL (ref <2.5)
MDMA: NEGATIVE ng/mL (ref <10)
Meprobamate: NEGATIVE ng/mL (ref <2.5)
Methadone: NEGATIVE ng/mL (ref <5.0)
Nicotine Metabolite: NEGATIVE ng/mL (ref <5.0)
Opiates: NEGATIVE ng/mL (ref <2.5)
Phencyclidine: NEGATIVE ng/mL (ref <10)
Tapentadol: 51.8 ng/mL — ABNORMAL HIGH (ref <5.0)
Tapentadol: POSITIVE ng/mL — AB (ref <5.0)
Tramadol: NEGATIVE ng/mL (ref <5.0)
Zolpidem: NEGATIVE ng/mL (ref <5.0)

## 2020-03-30 LAB — DRUG TOX ALC METAB W/CON, ORAL FLD: Alcohol Metabolite: NEGATIVE ng/mL (ref <25)

## 2020-04-05 ENCOUNTER — Telehealth: Payer: Self-pay | Admitting: *Deleted

## 2020-04-05 NOTE — Telephone Encounter (Signed)
Oral swab drug screen was consistent for prescribed medications.

## 2020-04-18 ENCOUNTER — Telehealth: Payer: Self-pay | Admitting: Family Medicine

## 2020-04-18 NOTE — Telephone Encounter (Signed)
Patient is calling and requesting a refill for levothyroxine (SYNTHROID) 50 MCG tablet sent to levothyroxine (SYNTHROID) 50 MCG tablet. CB is [phone]

## 2020-04-19 ENCOUNTER — Telehealth: Payer: Self-pay | Admitting: Registered Nurse

## 2020-04-19 ENCOUNTER — Other Ambulatory Visit: Payer: Self-pay

## 2020-04-19 DIAGNOSIS — E079 Disorder of thyroid, unspecified: Secondary | ICD-10-CM

## 2020-04-19 MED ORDER — MORPHINE SULFATE 15 MG PO TABS: ORAL_TABLET | ORAL | 0 refills | Status: DC

## 2020-04-19 MED ORDER — LEVOTHYROXINE SODIUM 50 MCG PO TABS: 50.0000 ug | ORAL_TABLET | Freq: Every day | ORAL | 1 refills | Status: DC

## 2020-04-19 NOTE — Telephone Encounter (Signed)
PMP was Reviewed. MSIR e-scribed. Ms. Rawlinson daughter is aware of the above via My-Chart message.

## 2020-04-19 NOTE — Telephone Encounter (Signed)
Rx sent to pt pharmacy [facility] per pt

## 2020-04-23 ENCOUNTER — Other Ambulatory Visit: Payer: Medicare Other | Admitting: Nurse Practitioner

## 2020-04-23 ENCOUNTER — Other Ambulatory Visit: Payer: Self-pay

## 2020-04-25 ENCOUNTER — Other Ambulatory Visit: Payer: Medicare Other | Admitting: Nurse Practitioner

## 2020-04-25 ENCOUNTER — Encounter: Payer: Self-pay | Admitting: Nurse Practitioner

## 2020-04-25 ENCOUNTER — Other Ambulatory Visit: Payer: Self-pay

## 2020-04-25 DIAGNOSIS — M069 Rheumatoid arthritis, unspecified: Secondary | ICD-10-CM | POA: Diagnosis not present

## 2020-04-25 DIAGNOSIS — Z515 Encounter for palliative care: Secondary | ICD-10-CM

## 2020-04-25 NOTE — Progress Notes (Signed)
[facility] Palliative Care Consult Note Telephone: [phone]  Fax: [phone]  PATIENT NAME: [name] DOB: 12/08/1940 MRN: [id number]  PRIMARY CARE PROVIDER:   [name], MD  REFERRING PROVIDER:  [name], MD [place],  [place] [id number]  RESPONSIBLE PARTY:   Self; husband Aya Geisel   RECOMMENDATIONS and PLAN: 1.ACP: full code currently with aggressive interventions.   2. RA; continue current regimen, pain management  3.Palliative care encounter Palliative medicine team will continue to support patient, patient's family, and medical team. Visit consisted of counseling and education dealing with the complex and emotionally intense issues of symptom management and palliative care in the setting of serious and potentially life-threatening   4. F/u 2 months for ongoing support with ongoing discussions of goc, monitoring chronic disease progression, symptoms   I spent 60 minutes providing this consultation,  from 10:00am to 11:00am. More than 50% of the time in this consultation was spent coordinating communication.   HISTORY OF PRESENT ILLNESS:  [name] is a 79 y.o. year old female with multiple medical problems including In-person palliative care follow-up visit from [name] for ongoing discussions of medical complexity decision-making, symptom management. I visited and observed [name]. We talked about purpose of palliative care visit. Joan. Mcdaniel in agreement. Joan. Mcdaniel daughter was present and involved in a discussion for some time of the Palliative care visit. Joan. Mcdaniel endorses she has been struggling with some challenges at home including the incontinence of her husband [name]. We talked about different coping strategies and alternatives. [name] endorses she has a housekeeper coming to help but may have to increase the frequency. We talked about how [name] has been  feeling today. [name] endorses she has been doing better. She has lost weight though she does continue to have edema in her lower extremities. We talked about disease progression. We talked about medical goals with care. We talked about symptoms of pain which he does experience in her back which has improved. We talked about her appetite. We talked about upcoming holidays. We talked about family dynamics. We talk about quality of life. We talked about [facility] palliative care and plan of care. We talked about follow-up visit in two months if needed or soon or should she declined. Joan. Mcdaniel in agreement oh, appointment scheduled. Therapeutic listening and emotional support provided. [name] expressed she was so grateful for Palliative care visit especially supportive role. Questions answered to satisfaction. Contact information  Palliative Care was asked to help to continue to address goals of care.   CODE STATUS: full code  PPS: 40% HOSPICE ELIGIBILITY/DIAGNOSIS: TBD  PAST MEDICAL HISTORY:  Past Medical History:  Diagnosis Date  . Acute on chronic diastolic (congestive) heart failure ([place])   . Anemia    iron deficiency anemia - secondary to blood loss ( chronic)   . Arthritis    endstage changes bilateral knees/bilateral ankles.   . Asthma   . Carotid artery occlusion   . Chronic fatigue   . Chronic kidney disease   . Closed left hip fracture ([place])   . Clotting disorder ([place])    pt denies this  . Contusion of left knee    due to fall 1/14.  [name] COPD (chronic obstructive pulmonary disease) (HCC)    pulmonary fibrosis  . Depression, reactive   . Diabetes mellitus    type II   . Diastolic dysfunction   . Difficulty  in walking   . Family history of heart disease   . Generalized muscle weakness   . Gout   . High cholesterol   . History of falling   . Hypertension   . Hypothyroidism   . Interstitial lung disease ([place])   . Meningioma of left sphenoid wing involving cavernous  sinus (HCC) 02/17/2012   Continue diplopia, left eye pain and left headaches.    . Morbid obesity ([place])   . Morbid obesity ([place])   . Neuromuscular disorder ([place])    diabetic neuropathy   . Normal coronary arteries    cardiac catheterization performed  10/31/14  . RA (rheumatoid arthritis) ([place])    has been off methotreaxte since 10/13.  [name] Rheumatoid arthritis ([place])   . Shortness of breath   . Spinal stenosis of lumbar region   . Thyroid disease   . Unspecified lack of coordination   . URI (upper respiratory infection)     SOCIAL HX:  Social History   Tobacco Use  . Smoking status: Never Smoker  . Smokeless tobacco: Never Used  Substance Use Topics  . Alcohol use: No    ALLERGIES:  Allergies  Allergen Reactions  . Codeine Swelling and Other (See Comments)    Facial swelling Chest pain and swelling in legs  Facial swelling  . Infliximab Anaphylaxis    "sent me into shock" "sent me into shock"  . Lisinopril Swelling    Face and neck swelling Face and neck swelling     PERTINENT MEDICATIONS:  Outpatient Encounter Medications as of 04/25/2020  Medication Sig  . acetaminophen (TYLENOL) 500 MG tablet Take 1,000 mg by mouth every 6 (six) hours as needed for moderate pain.   [name] albuterol (PROVENTIL HFA;VENTOLIN HFA) 108 (90 BASE) MCG/ACT inhaler Inhale 2 puffs into the lungs every 6 (six) hours as needed for wheezing or shortness of breath.  . allopurinol (ZYLOPRIM) 300 MG tablet Take 300 mg by mouth daily.  [name] aspirin buffered (BUFFERIN) 325 MG TABS tablet Take 325 mg by mouth daily.  [name] atorvastatin (LIPITOR) 20 MG tablet Take 10 mg by mouth every evening.   . Calcium Carb-Cholecalciferol (CALCIUM + D3 PO) Take 2 tablets by mouth daily with lunch.   . cholecalciferol (VITAMIN D) 1000 UNITS tablet Take 2,000 Units by mouth daily with lunch.   . colchicine 0.6 MG tablet Take 0.6 mg by mouth daily.  . cyclobenzaprine (FLEXERIL) 5 MG tablet Take 1 tablet (5 mg total) by mouth at  bedtime as needed for muscle spasms.  . diclofenac sodium (VOLTAREN) 1 % GEL Apply 2 g topically 3 (three) times daily. Both hands and knees  . DULoxetine (CYMBALTA) 60 MG capsule Take 1 capsule (60 mg total) by mouth daily.  . fluticasone (FLONASE) 50 MCG/ACT nasal spray Place 2 sprays into both nostrils daily.  . furosemide (LASIX) 80 MG tablet Take 80 mg by mouth.  . gabapentin (NEURONTIN) 600 MG tablet Take two in the morning , Two mid- afternoon and two at bedtime (Patient taking differently: Take 1,200 mg by mouth 3 (three) times daily. Take two in the morning , Two mid- afternoon and two at bedtime)  . guaiFENesin (MUCINEX) 600 MG 12 hr tablet Take 1,200 mg by mouth 2 (two) times daily.  . hydroxychloroquine (PLAQUENIL) 200 MG tablet Take 400 mg by mouth daily.   [name] ipratropium-albuterol (DUONEB) 0.5-2.5 (3) MG/3ML SOLN Take 3 mLs by nebulization every 6 (six) hours as needed (cough/wheezing).   [name] levothyroxine (SYNTHROID)  50 MCG tablet Take 1 tablet (50 mcg total) by mouth daily.  [name] linaclotide (LINZESS) 72 MCG capsule Take 1 capsule (72 mcg total) by mouth daily before breakfast.  . metolazone (ZAROXOLYN) 2.5 MG tablet TAKE 1 TABLET BY MOUTH DAILY AS NEEDED FOR 2 DAYS FOR WEIGHT ABOVE 255 POUNDS  . metoprolol succinate (TOPROL XL) 25 MG 24 hr tablet Take 3 tablets (75 mg total) by mouth daily.  [name] morphine (MS CONTIN) 15 MG 12 hr tablet Take 1 tablet (15 mg total) by mouth every 12 (twelve) hours.  [name] morphine (MSIR) 15 MG tablet One tablet twice a day as needed for break through pain.  . Multiple Vitamin (MULTIVITAMIN WITH MINERALS) TABS Take 1 tablet by mouth daily.   [name] olmesartan (BENICAR) 40 MG tablet Take 1 tablet (40 mg total) by mouth daily.  . OXYGEN-HELIUM IN Inhale 2 L into the lungs at bedtime. And on exertion  . pantoprazole (PROTONIX) 40 MG tablet take 1 tablet by mouth once daily MINUTES BEFORE 1ST MEAL OF THE DAY  . potassium chloride SA (KLOR-CON) 20 MEQ tablet Take 1 tablet  (20 mEq total) by mouth daily.  . predniSONE (DELTASONE) 5 MG tablet Take 10 mg by mouth every morning.   . Semaglutide,0.25 or 0.5MG/DOS, (OZEMPIC, 0.25 OR 0.5 MG/DOSE,) 2 MG/1.5ML SOPN Inject 0.375 mLs (0.5 mg total) into the skin once a week.  . sodium chloride (OCEAN) 0.65 % SOLN nasal spray Place 1 spray into both nostrils as needed for congestion.  [name] Spacer/Aero-Holding Chambers (AEROCHAMBER MV) inhaler Use as instructed   No facility-administered encounter medications on file as of 04/25/2020.    PHYSICAL EXAM:   General: NAD, chronically ill, pleasant female Cardiovascular: regular rate and rhythm Pulmonary: clear ant fields Extremities: + 3 edema, no joint deformities Neurological: generalized weakness; uses a rolaid aid walker   Makaela Cando Ihor Gully, NP

## 2020-04-27 ENCOUNTER — Telehealth: Payer: Self-pay | Admitting: Registered Nurse

## 2020-04-27 MED ORDER — MORPHINE SULFATE ER 15 MG PO TBCR: 15.0000 mg | EXTENDED_RELEASE_TABLET | Freq: Two times a day (BID) | ORAL | 0 refills | Status: DC

## 2020-04-27 MED ORDER — MORPHINE SULFATE 15 MG PO TABS: ORAL_TABLET | ORAL | 0 refills | Status: DC

## 2020-04-27 NOTE — Telephone Encounter (Signed)
PMP was reviewed: MsContin and MSIR e-scribed today. Message sent to Ms. Forgy and daughter [name].

## 2020-04-29 ENCOUNTER — Other Ambulatory Visit: Payer: Self-pay | Admitting: Family Medicine

## 2020-04-29 DIAGNOSIS — I5032 Chronic diastolic (congestive) heart failure: Secondary | ICD-10-CM

## 2020-04-30 ENCOUNTER — Ambulatory Visit (INDEPENDENT_AMBULATORY_CARE_PROVIDER_SITE_OTHER): Payer: Medicare Other | Admitting: Cardiology

## 2020-04-30 ENCOUNTER — Encounter: Payer: Self-pay | Admitting: Cardiology

## 2020-04-30 ENCOUNTER — Other Ambulatory Visit: Payer: Self-pay

## 2020-04-30 VITALS — BP 151/96 | HR 93 | Ht 64.0 in | Wt 238.8 lb

## 2020-04-30 DIAGNOSIS — J849 Interstitial pulmonary disease, unspecified: Secondary | ICD-10-CM | POA: Diagnosis not present

## 2020-04-30 DIAGNOSIS — M5442 Lumbago with sciatica, left side: Secondary | ICD-10-CM | POA: Diagnosis not present

## 2020-04-30 DIAGNOSIS — Z79899 Other long term (current) drug therapy: Secondary | ICD-10-CM | POA: Diagnosis not present

## 2020-04-30 DIAGNOSIS — R079 Chest pain, unspecified: Secondary | ICD-10-CM

## 2020-04-30 DIAGNOSIS — R0600 Dyspnea, unspecified: Secondary | ICD-10-CM | POA: Diagnosis not present

## 2020-04-30 DIAGNOSIS — M05742 Rheumatoid arthritis with rheumatoid factor of left hand without organ or systems involvement: Secondary | ICD-10-CM | POA: Diagnosis not present

## 2020-04-30 DIAGNOSIS — M05741 Rheumatoid arthritis with rheumatoid factor of right hand without organ or systems involvement: Secondary | ICD-10-CM

## 2020-04-30 DIAGNOSIS — G8929 Other chronic pain: Secondary | ICD-10-CM | POA: Diagnosis not present

## 2020-04-30 DIAGNOSIS — R0609 Other forms of dyspnea: Secondary | ICD-10-CM

## 2020-04-30 DIAGNOSIS — M48061 Spinal stenosis, lumbar region without neurogenic claudication: Secondary | ICD-10-CM

## 2020-04-30 DIAGNOSIS — M1A9XX0 Chronic gout, unspecified, without tophus (tophi): Secondary | ICD-10-CM | POA: Diagnosis not present

## 2020-04-30 DIAGNOSIS — M5416 Radiculopathy, lumbar region: Secondary | ICD-10-CM | POA: Diagnosis not present

## 2020-04-30 DIAGNOSIS — M1A00X0 Idiopathic chronic gout, unspecified site, without tophus (tophi): Secondary | ICD-10-CM | POA: Diagnosis not present

## 2020-04-30 DIAGNOSIS — M0579 Rheumatoid arthritis with rheumatoid factor of multiple sites without organ or systems involvement: Secondary | ICD-10-CM | POA: Diagnosis not present

## 2020-04-30 NOTE — Progress Notes (Signed)
+

## 2020-04-30 NOTE — Assessment & Plan Note (Signed)
Followed at [facility]

## 2020-04-30 NOTE — Progress Notes (Signed)
Cardiology Office Note:    Date:  04/30/2020   ID:  [name], DOB 25-Jul-1940, MRN [id number]  PCP:  [name], MD  Cardiologist:  Quay Burow, MD  Electrophysiologist:  None   Referring MD: [name], MD   No chief complaint on file.   History of Present Illness:    [name] is a 79 y.o. female with a hx of mild nonobstructive coronary disease at catheterization in 2016, interstitial lung disease followed at [facility], rheumatoid arthritis followed at [facility], severe DJD with radiculopathy followed by palliative care, and morbid obesity with a BMI of 40.  Patient was seen as a routine follow-up for 6 months.  From a cardiac standpoint she is done well, she denies any chest pain that sounds like angina.  She is not had palpitations.  She has chronic dyspnea from her interstitial lung disease.  She tells me her sleep study was negative for sleep apnea, she does use oxygen at night and as needed.  She was followed up by the rheumatology clinic at [facility] in October in the pulmonary clinic in August.  She tells me that she did receive an injection for her chronic radiculopathy, this did seem to improve her pain.  Past Medical History:  Diagnosis Date  . Acute on chronic diastolic (congestive) heart failure ([place])   . Anemia    iron deficiency anemia - secondary to blood loss ( chronic)   . Arthritis    endstage changes bilateral knees/bilateral ankles.   . Asthma   . Carotid artery occlusion   . Chronic fatigue   . Chronic kidney disease   . Closed left hip fracture ([place])   . Clotting disorder ([place])    pt denies this  . Contusion of left knee    due to fall 1/14.  [name] COPD (chronic obstructive pulmonary disease) (HCC)    pulmonary fibrosis  . Depression, reactive   . Diabetes mellitus    type II   . Diastolic dysfunction   . Difficulty in walking   . Family history of heart disease   . Generalized muscle weakness   . Gout   . High cholesterol   .  History of falling   . Hypertension   . Hypothyroidism   . Interstitial lung disease ([place])   . 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    . Morbid obesity ([place])   . Morbid obesity ([place])   . Neuromuscular disorder ([place])    diabetic neuropathy   . Normal coronary arteries    cardiac catheterization performed  10/31/14  . RA (rheumatoid arthritis) ([place])    has been off methotreaxte since 10/13.  [name] Rheumatoid arthritis ([place])   . Shortness of breath   . Spinal stenosis of lumbar region   . Thyroid disease   . Unspecified lack of coordination   . URI (upper respiratory infection)     Past Surgical History:  Procedure Laterality Date  . ABDOMINAL HYSTERECTOMY    . BRAIN SURGERY     Gamma knife 10/13. Needs repeat spring  '14  . CARDIAC CATHETERIZATION N/A 10/31/2014   Procedure: Right/Left Heart Cath and Coronary Angiography;  Surgeon: [name], MD;  Location: [place] [facility];  Service: Cardiovascular;  Laterality: N/A;  . ESOPHAGOGASTRODUODENOSCOPY (EGD) WITH PROPOFOL N/A 09/14/2014   Procedure: ESOPHAGOGASTRODUODENOSCOPY (EGD) WITH PROPOFOL;  Surgeon: [name], MD;  [facility];  Service: Endoscopy;  Laterality: N/A;  . INCISION AND DRAINAGE HIP Left 01/16/2017   Procedure: IRRIGATION AND DEBRIDEMENT LEFT HIP;  Surgeon: [name], MD;  [facility];  Service: Orthopedics;  Laterality: Left;  . INTRAMEDULLARY (IM) NAIL INTERTROCHANTERIC Left 11/29/2016   Procedure: INTRAMEDULLARY (IM) NAIL INTERTROCHANTRIC;  Surgeon: [name], MD;  Location: [place];  Service: Orthopedics;  Laterality: Left;  . OVARY SURGERY    . SHOULDER SURGERY Left   . TONSILLECTOMY  age 75  . VIDEO BRONCHOSCOPY Bilateral 05/31/2013   Procedure: VIDEO BRONCHOSCOPY WITHOUT FLUORO;  Surgeon: Brand Males, MD;  Location: [place];  Service: Cardiopulmonary;   Laterality: Bilateral;  . video bronscoscopy  2000   lung    Current Medications: Current Meds  Medication Sig  . acetaminophen (TYLENOL) 500 MG tablet Take 1,000 mg by mouth every 6 (six) hours as needed for moderate pain.   [name] albuterol (PROVENTIL HFA;VENTOLIN HFA) 108 (90 BASE) MCG/ACT inhaler Inhale 2 puffs into the lungs every 6 (six) hours as needed for wheezing or shortness of breath.  . allopurinol (ZYLOPRIM) 300 MG tablet Take 300 mg by mouth daily.  [name] aspirin buffered (BUFFERIN) 325 MG TABS tablet Take 325 mg by mouth daily.  [name] atorvastatin (LIPITOR) 20 MG tablet Take 10 mg by mouth every evening.   . Calcium Carb-Cholecalciferol (CALCIUM + D3 PO) Take 2 tablets by mouth daily with lunch.   . cholecalciferol (VITAMIN D) 1000 UNITS tablet Take 2,000 Units by mouth daily with lunch.   . colchicine 0.6 MG tablet Take 0.6 mg by mouth daily.  . cyclobenzaprine (FLEXERIL) 5 MG tablet Take 1 tablet (5 mg total) by mouth at bedtime as needed for muscle spasms.  . diclofenac sodium (VOLTAREN) 1 % GEL Apply 2 g topically 3 (three) times daily. Both hands and knees  . DULoxetine (CYMBALTA) 60 MG capsule Take 1 capsule (60 mg total) by mouth daily.  . fluticasone (FLONASE) 50 MCG/ACT nasal spray Place 2 sprays into both nostrils daily.  . furosemide (LASIX) 80 MG tablet Take 80 mg by mouth.  . gabapentin (NEURONTIN) 600 MG tablet Take two in the morning , Two mid- afternoon and two at bedtime (Patient taking differently: Take 1,200 mg by mouth 3 (three) times daily. Take two in the morning , Two mid- afternoon and two at bedtime)  . guaiFENesin (MUCINEX) 600 MG 12 hr tablet Take 1,200 mg by mouth 2 (two) times daily.  . hydroxychloroquine (PLAQUENIL) 200 MG tablet Take 400 mg by mouth daily.   [name] ipratropium-albuterol (DUONEB) 0.5-2.5 (3) MG/3ML SOLN Take 3 mLs by nebulization every 6 (six) hours as needed (cough/wheezing).   [name] levothyroxine (SYNTHROID) 50 MCG tablet Take 1 tablet (50 mcg total)  by mouth daily.  [name] linaclotide (LINZESS) 72 MCG capsule Take 1 capsule (72 mcg total) by mouth daily before breakfast.  . metolazone (ZAROXOLYN) 2.5 MG tablet TAKE 1 TABLET BY MOUTH DAILY AS NEEDED FOR 2 DAYS FOR WEIGHT ABOVE 255 POUNDS  . metoprolol succinate (TOPROL XL) 25 MG 24 hr tablet Take 3 tablets (75 mg total) by mouth daily.  [name] morphine (MS CONTIN) 15 MG 12 hr tablet Take 1 tablet (15 mg total) by mouth every 12 (twelve) hours.  [name] morphine (MSIR) 15 MG tablet One tablet twice a day as needed for break through pain.  . Multiple Vitamin (MULTIVITAMIN WITH MINERALS) TABS Take 1 tablet by mouth daily.   [name] olmesartan (BENICAR) 40 MG tablet  Take 1 tablet (40 mg total) by mouth daily.  . OXYGEN-HELIUM IN Inhale 2 L into the lungs at bedtime. And on exertion  . pantoprazole (PROTONIX) 40 MG tablet take 1 tablet by mouth once daily MINUTES BEFORE 1ST MEAL OF THE DAY  . potassium chloride SA (KLOR-CON) 20 MEQ tablet TAKE 1 TABLET(20 MEQ) BY MOUTH DAILY  . predniSONE (DELTASONE) 5 MG tablet Take 10 mg by mouth every morning.   . Semaglutide,0.25 or 0.5MG/DOS, (OZEMPIC, 0.25 OR 0.5 MG/DOSE,) 2 MG/1.5ML SOPN Inject 0.375 mLs (0.5 mg total) into the skin once a week.  . sodium chloride (OCEAN) 0.65 % SOLN nasal spray Place 1 spray into both nostrils as needed for congestion.  [name] Spacer/Aero-Holding Chambers (AEROCHAMBER MV) inhaler Use as instructed     Allergies:   Codeine, Infliximab, and Lisinopril   Social History   Socioeconomic History  . Marital status: Married    Spouse name: Not on file  . Number of children: 6  . Years of education: college  . Highest education level: Not on file  Occupational History  . Occupation: retired Marine scientist  Tobacco Use  . Smoking status: Never Smoker  . Smokeless tobacco: Never Used  Vaping Use  . Vaping Use: Never used  Substance and Sexual Activity  . Alcohol use: No  . Drug use: No  . Sexual activity: Not Currently  Other Topics Concern  . Not on  file  Social History Narrative   Patient consumes 2-3 cups coffee per day.   Social Determinants of Radio broadcast assistant Strain: Not on file  Food Insecurity: Not on file  Transportation Needs: Not on file  Physical Activity: Not on file  Stress: Not on file  Social Connections: Not on file     Family History: The patient's family history includes Breast cancer in her sister; Diabetes in her brother, mother, and sister; Gout in her brother; Heart attack in her brother and mother; Heart disease in her brother; Hypertension in her brother and father; Kidney cancer in her brother; Kidney failure in her brother; Lung cancer in her father; Rheum arthritis in her maternal uncle; Uterine cancer in her daughter.  ROS:   Please see the history of present illness.     All other systems reviewed and are negative.  EKGs/Labs/Other Studies Reviewed:    The following studies were reviewed today:  Rt and Lt heart cath June 2016-  Mild CAD without significant obstructive disease. Ectasia in all three major proximal vessels.  Mild pulmonary hypertension. Aortic saturation 87%. Pulmonary artery saturation 60%. Cardiac output 7.6 L. Cardiac index 3.3.   Echo 06/04/2018- Study Conclusions   - Left ventricle: The cavity size was normal. There was mild focal  basal hypertrophy of the septum. Systolic function was normal.  The estimated ejection fraction was in the range of 60% to 65%.  Wall motion was normal; there were no regional wall motion  abnormalities.  - Right ventricle: The cavity size was mildly dilated. Wall  thickness was normal.  - Pulmonary arteries: Systolic pressure was mildly increased.   EKG:  EKG is not ordered today.  The ekg 10/13/2019 today demonstrates NSR, HR 94, LAD  Recent Labs: 09/22/2019: TSH 1.84 11/10/2019: BUN 12; Creatinine, Ser 1.22; Hemoglobin 12.4; Platelets 181; Potassium 4.3; Sodium 144  Recent Lipid Panel    Component Value Date/Time    CHOL 161 09/22/2019 1144   CHOL 150 05/26/2018 1259   TRIG 194.0 (H) 09/22/2019 1144   HDL  47.20 09/22/2019 1144   HDL 65 05/26/2018 1259   CHOLHDL 3 09/22/2019 1144   VLDL 38.8 09/22/2019 1144   LDLCALC 75 09/22/2019 1144   LDLCALC 63 05/26/2018 1259    Physical Exam:    VS:  BP (!) 151/96   Pulse 93   Ht 5' 4" (1.626 m)   Wt 238 lb 12.8 oz (108.3 kg)   BMI 40.99 kg/m     Wt Readings from Last 3 Encounters:  04/30/20 238 lb 12.8 oz (108.3 kg)  03/27/20 234 lb 6.4 oz (106.3 kg)  01/25/20 236 lb (107 kg)     GEN:  Morbidly obese AA female,in wheelchair,  in no acute distress HEENT: Normal NECK: No JVD; No carotid bruits CARDIAC: RRR, no murmurs, rubs, gallops RESPIRATORY:  Fine crackles bilaterally ABDOMEN: obese, soft, non-distended MUSCULOSKELETAL:  No edema; No deformity  SKIN: Warm and dry NEUROLOGIC:  Alert and oriented x 3 PSYCHIATRIC:  Normal affect   ASSESSMENT:    DOE (dyspnea on exertion) Chronic  Chest pain of uncertain etiology Normal coronaries at cath 2016  Rheumatoid arthritis ([place]) Followed at [facility]  ILD (interstitial lung disease) ([place]) Followed at [facility]- mild pulmonary HTN  Morbid obesity ([place]) BMI 40  Spinal stenosis of lumbar region with radiculopathy Followed by palliative care  PLAN:    No change in Rx- f/u Dr Gwenlyn Found in 6 months   Medication Adjustments/Labs and Tests Ordered: Current medicines are reviewed at length with the patient today.  Concerns regarding medicines are outlined above.  No orders of the defined types were placed in this encounter.  No orders of the defined types were placed in this encounter.   There are no Patient Instructions on file for this visit.   Angelena Form, PA-C  04/30/2020 11:49 AM    [place]

## 2020-04-30 NOTE — Assessment & Plan Note (Signed)
Normal coronaries at cath 2016

## 2020-04-30 NOTE — Assessment & Plan Note (Signed)
Followed by palliative care

## 2020-04-30 NOTE — Patient Instructions (Signed)
Medication Instructions:  Continue current medications  *If you need a refill on your cardiac medications before your next appointment, please call your pharmacy*   Lab Work: None Ordered   Testing/Procedures: None Ordered   Follow-Up: At [facility], you and your health needs are our priority.  As part of our continuing mission to provide you with exceptional heart care, we have created designated Provider Care Teams.  These Care Teams include your primary Cardiologist (physician) and Advanced Practice Providers (APPs -  Physician Assistants and Nurse Practitioners) who all work together to provide you with the care you need, when you need it.  We recommend signing up for the patient portal called "MyChart".  Sign up information is provided on this After Visit Summary.  MyChart is used to connect with patients for Virtual Visits (Telemedicine).  Patients are able to view lab/test results, encounter notes, upcoming appointments, etc.  Non-urgent messages can be sent to your provider as well.   To learn more about what you can do with MyChart, go to [email or web address].    Your next appointment:   6 month(s)  The format for your next appointment:   In Person  Provider:   You may see Quay Burow, MD or one of the following Advanced Practice Providers on your designated Care Team:    Kerin Ransom, PA-C  [place], [place]  Coletta Memos, [place]

## 2020-04-30 NOTE — Assessment & Plan Note (Signed)
Chronic. 

## 2020-04-30 NOTE — Assessment & Plan Note (Signed)
Followed at [facility]- mild pulmonary HTN

## 2020-04-30 NOTE — Assessment & Plan Note (Signed)
BMI 40

## 2020-05-02 DIAGNOSIS — M0579 Rheumatoid arthritis with rheumatoid factor of multiple sites without organ or systems involvement: Secondary | ICD-10-CM | POA: Diagnosis not present

## 2020-05-02 DIAGNOSIS — Z79899 Other long term (current) drug therapy: Secondary | ICD-10-CM | POA: Diagnosis not present

## 2020-05-29 ENCOUNTER — Encounter: Payer: Self-pay | Admitting: Registered Nurse

## 2020-05-29 ENCOUNTER — Other Ambulatory Visit: Payer: Self-pay

## 2020-05-29 ENCOUNTER — Encounter: Payer: Medicare Other | Attending: Physical Medicine & Rehabilitation | Admitting: Registered Nurse

## 2020-05-29 VITALS — BP 151/93 | HR 98 | Temp 97.9°F | Ht 64.0 in | Wt 235.2 lb

## 2020-05-29 DIAGNOSIS — G894 Chronic pain syndrome: Secondary | ICD-10-CM | POA: Insufficient documentation

## 2020-05-29 DIAGNOSIS — M5416 Radiculopathy, lumbar region: Secondary | ICD-10-CM | POA: Insufficient documentation

## 2020-05-29 DIAGNOSIS — M170 Bilateral primary osteoarthritis of knee: Secondary | ICD-10-CM | POA: Diagnosis not present

## 2020-05-29 DIAGNOSIS — M7062 Trochanteric bursitis, left hip: Secondary | ICD-10-CM | POA: Insufficient documentation

## 2020-05-29 DIAGNOSIS — M2550 Pain in unspecified joint: Secondary | ICD-10-CM | POA: Insufficient documentation

## 2020-05-29 DIAGNOSIS — M47816 Spondylosis without myelopathy or radiculopathy, lumbar region: Secondary | ICD-10-CM | POA: Insufficient documentation

## 2020-05-29 DIAGNOSIS — Z5181 Encounter for therapeutic drug level monitoring: Secondary | ICD-10-CM | POA: Insufficient documentation

## 2020-05-29 DIAGNOSIS — R5382 Chronic fatigue, unspecified: Secondary | ICD-10-CM | POA: Insufficient documentation

## 2020-05-29 DIAGNOSIS — Z79891 Long term (current) use of opiate analgesic: Secondary | ICD-10-CM | POA: Diagnosis not present

## 2020-05-29 DIAGNOSIS — F329 Major depressive disorder, single episode, unspecified: Secondary | ICD-10-CM | POA: Diagnosis not present

## 2020-05-29 DIAGNOSIS — M7061 Trochanteric bursitis, right hip: Secondary | ICD-10-CM | POA: Insufficient documentation

## 2020-05-29 MED ORDER — DULOXETINE HCL 60 MG PO CPEP: 60.0000 mg | ORAL_CAPSULE | Freq: Every day | ORAL | 3 refills | Status: DC

## 2020-05-29 MED ORDER — GABAPENTIN 600 MG PO TABS: ORAL_TABLET | ORAL | 3 refills | Status: DC

## 2020-05-29 MED ORDER — MORPHINE SULFATE 15 MG PO TABS: ORAL_TABLET | ORAL | 0 refills | Status: DC

## 2020-05-29 MED ORDER — MORPHINE SULFATE ER 15 MG PO TBCR: 15.0000 mg | EXTENDED_RELEASE_TABLET | Freq: Two times a day (BID) | ORAL | 0 refills | Status: DC

## 2020-05-29 NOTE — Progress Notes (Signed)
Subjective:    Patient ID: [name], female    DOB: 14-Jun-1940, 80 y.o.   MRN: [id number]  HPI: [name] is a 80 y.o. female who returns for follow up appointment for chronic pain and medication refill. She states her pain is located in her lower back radiating into her bilateral hips L>R and left lower extremity. Also reports bilateral knee pain and generalized joint pain. She rates her pain 7. Her current exercise regime is walking with walker in her home.   [name] Morphine equivalent is 60.00  MME.  Last Oral Swab was Performed on 03/27/2020, it was consistent.   Pain Inventory Average Pain 6 Pain Right Now 7 My pain is intermittent, sharp and dull  In the last 24 hours, has pain interfered with the following? General activity 7 Relation with others 7 Enjoyment of life 7 What TIME of day is your pain at its worst? morning  Sleep (in general) Poor  Pain is worse with: walking, bending and sitting Pain improves with: rest, heat/ice, therapy/exercise and medication Relief from Meds: 5  Family History  Problem Relation Age of Onset  . Diabetes Mother   . Heart attack Mother   . Hypertension Father   . Lung cancer Father   . Diabetes Sister   . Diabetes Brother   . Hypertension Brother   . Heart disease Brother   . Heart attack Brother   . Kidney cancer Brother   . Uterine cancer Daughter   . Breast cancer Sister   . Rheum arthritis Maternal Uncle   . Gout Brother   . Kidney failure Brother        x 5   Social History   Socioeconomic History  . Marital status: Married    Spouse name: Not on file  . Number of children: 6  . Years of education: college  . Highest education level: Not on file  Occupational History  . Occupation: retired Marine scientist  Tobacco Use  . Smoking status: Never Smoker  . Smokeless tobacco: Never Used  Vaping Use  . Vaping Use: Never used  Substance and Sexual Activity  . Alcohol use: No  . Drug use: No  . Sexual  activity: Not Currently  Other Topics Concern  . Not on file  Social History Narrative   Patient consumes 2-3 cups coffee per day.   Social Determinants of Health   Financial Resource Strain: Not on file  Food Insecurity: Not on file  Transportation Needs: Not on file  Physical Activity: Not on file  Stress: Not on file  Social Connections: Not on file   Past Surgical History:  Procedure Laterality Date  . ABDOMINAL HYSTERECTOMY    . BRAIN SURGERY     Gamma knife 10/13. Needs repeat spring  '14  . CARDIAC CATHETERIZATION N/A 10/31/2014   Procedure: Right/Left Heart Cath and Coronary Angiography;  Surgeon: [name], MD;  Location: [place] [facility];  Service: Cardiovascular;  Laterality: N/A;  . ESOPHAGOGASTRODUODENOSCOPY (EGD) WITH PROPOFOL N/A 09/14/2014   Procedure: ESOPHAGOGASTRODUODENOSCOPY (EGD) WITH PROPOFOL;  Surgeon: [name], MD;  [facility];  Service: Endoscopy;  Laterality: N/A;  . INCISION AND DRAINAGE HIP Left 01/16/2017   Procedure: IRRIGATION AND DEBRIDEMENT LEFT HIP;  Surgeon: [name], MD;  [facility];  Service: Orthopedics;  Laterality: Left;  . INTRAMEDULLARY (IM) NAIL INTERTROCHANTERIC Left 11/29/2016   Procedure: INTRAMEDULLARY (IM) NAIL INTERTROCHANTRIC;  Surgeon: [name], MD;  Location:  [place] [facility];  Service: Orthopedics;  Laterality: Left;  . OVARY SURGERY    . SHOULDER SURGERY Left   . TONSILLECTOMY  age 34  . VIDEO BRONCHOSCOPY Bilateral 05/31/2013   Procedure: VIDEO BRONCHOSCOPY WITHOUT FLUORO;  Surgeon: Brand Males, MD;  Location: [place];  Service: Cardiopulmonary;  Laterality: Bilateral;  . video bronscoscopy  2000   lung   Past Surgical History:  Procedure Laterality Date  . ABDOMINAL HYSTERECTOMY    . BRAIN SURGERY     Gamma knife 10/13. Needs repeat spring  '14  . CARDIAC CATHETERIZATION N/A 10/31/2014   Procedure: Right/Left Heart Cath and Coronary Angiography;  Surgeon:  [name], MD;  Location: [place] [facility];  Service: Cardiovascular;  Laterality: N/A;  . ESOPHAGOGASTRODUODENOSCOPY (EGD) WITH PROPOFOL N/A 09/14/2014   Procedure: ESOPHAGOGASTRODUODENOSCOPY (EGD) WITH PROPOFOL;  Surgeon: [name], MD;  [facility];  Service: Endoscopy;  Laterality: N/A;  . INCISION AND DRAINAGE HIP Left 01/16/2017   Procedure: IRRIGATION AND DEBRIDEMENT LEFT HIP;  Surgeon: [name], MD;  [facility];  Service: Orthopedics;  Laterality: Left;  . INTRAMEDULLARY (IM) NAIL INTERTROCHANTERIC Left 11/29/2016   Procedure: INTRAMEDULLARY (IM) NAIL INTERTROCHANTRIC;  Surgeon: [name], MD;  Location: [place];  Service: Orthopedics;  Laterality: Left;  . OVARY SURGERY    . SHOULDER SURGERY Left   . TONSILLECTOMY  age 37  . VIDEO BRONCHOSCOPY Bilateral 05/31/2013   Procedure: VIDEO BRONCHOSCOPY WITHOUT FLUORO;  Surgeon: Brand Males, MD;  Location: [place];  Service: Cardiopulmonary;  Laterality: Bilateral;  . video bronscoscopy  2000   lung   Past Medical History:  Diagnosis Date  . Acute on chronic diastolic (congestive) heart failure ([place])   . Anemia    iron deficiency anemia - secondary to blood loss ( chronic)   . Arthritis    endstage changes bilateral knees/bilateral ankles.   . Asthma   . Carotid artery occlusion   . Chronic fatigue   . Chronic kidney disease   . Closed left hip fracture ([place])   . Clotting disorder ([place])    pt denies this  . Contusion of left knee    due to fall 1/14.  [name] COPD (chronic obstructive pulmonary disease) (HCC)    pulmonary fibrosis  . Depression, reactive   . Diabetes mellitus    type II   . Diastolic dysfunction   . Difficulty in walking   . Family history of heart disease   . Generalized muscle weakness   . Gout   . High cholesterol   . History of falling   . Hypertension   . Hypothyroidism   . Interstitial lung disease ([place])   . Meningioma of left sphenoid  wing involving cavernous sinus (HCC) 02/17/2012   Continue diplopia, left eye pain and left headaches.    . Morbid obesity ([place])   . Morbid obesity ([place])   . Neuromuscular disorder ([place])    diabetic neuropathy   . Normal coronary arteries    cardiac catheterization performed  10/31/14  . RA (rheumatoid arthritis) ([place])    has been off methotreaxte since 10/13.  [name] Rheumatoid arthritis ([place])   . Shortness of breath   . Spinal stenosis of lumbar region   . Thyroid disease   . Unspecified lack of coordination   . URI (upper respiratory infection)    BP (!) 151/93   Pulse 98   Temp 97.9 F (36.6 C)   Ht _0  (1.626 m)  Wt 235 lb 3.2 oz (106.7 kg)   SpO2 91%   BMI 40.37 kg/m   Opioid Risk Score:   Fall Risk Score:  `1  Depression screen PHQ 2/9  Depression screen  [facility] 2/9 12/14/2019 09/22/2019 06/11/2018 05/26/2018 12/09/2017 10/22/2017 09/22/2017  Decreased Interest _0 0 0  Down, Depressed, Hopeless _1 0 0  PHQ - 2 Score _2 0 0  Altered sleeping - _3 - - -  Tired, decreased energy - _4 - - -  Change in appetite - _5 - - -  Feeling bad or failure about yourself  - _6 - - -  Trouble concentrating - 2 2 0 - - -  Moving slowly or fidgety/restless - 1 0 2 - - -  Suicidal thoughts - 0 0 0 - - -  PHQ-9 Score - _7 - - -  Difficult doing work/chores - Somewhat difficult - Somewhat difficult - - -  Some recent data might be hidden    Review of Systems  Musculoskeletal: Positive for back pain and gait problem.  All other systems reviewed and are negative.      Objective:   Physical Exam Vitals and nursing note reviewed.  Constitutional:      Appearance: Normal appearance.  Cardiovascular:     Rate and Rhythm: Normal rate and regular rhythm.     Pulses: Normal pulses.     Heart sounds: Normal heart sounds.  Pulmonary:     Effort: Pulmonary effort is normal.     Breath sounds: Normal breath sounds.  Musculoskeletal:     Cervical back: Normal  range of motion and neck supple.     Comments: Normal Muscle Bulk and Muscle Testing Reveals:  Upper Extremities: Full ROM and Muscle Strength 5/5  Thoracic Hypersensitivity: T-1-T-3 Lumbar Hypersensitivity Bilateral Greater Trochanter Tenderness Lower Extremities: Full ROM and Muscle Strength 5/5 Arises from Table Slowly using walker for support Antalgic Gait   Skin:    General: Skin is warm and dry.  Neurological:     Mental Status: She is alert and oriented to person, place, and time.  Psychiatric:        Mood and Affect: Mood normal.        Behavior: Behavior normal.           Assessment & Plan:  1. Lumbar spinal stenosis with neurogenic claudication. Associated facet arthropathy/ Lumbar Radiculitis: Continuecurrent medication regime withGabapentinand HEP as tolerated.05/29/2020 Refilled: MS Contin 15 mg one tablet every 12 hours #60andMSIR 15 one tablet twice a dayas neededFor break through pain( 6 am and 1:00 pm)#60.since adverse effect with insomnia with Nucynta. Nucynta discontinued due to insomnia.  We will continue the opioid monitoring program, this consists of regular clinic visits, examinations, urine drug screen, pill counts as well as use of [place] Controlled Substance Reporting system. A 12 month History has been reviewed on the [place] 05/29/2020. 2. Chronic Bilateral Thoracic Back Pain: Continue HEP as Tolerated. Continue to Monitor. 05/29/2020 3 Depression: Continuecurrent medication Regime:Cymbalta.Continue to monitor.05/29/2020 4. Diabetes mellitus type 2 with polyneuropathy: Continuecurrent medication regime:Gabapentin.05/29/2020 5. Rheumatoid arthritis and osteoarthritis. Continuecurrent medication Regime.Voltaren Gel. Rheumatology Following.05/29/2020 6. Interstitial lung disease: Pulmonology Following.05/29/2020. 7. Bilateral Osteoarthritis Knee's: Continuecurrent medication  regimen.Voltaren Gel.05/29/2020. 8. Muscle Spasm: Continuecurrent medication regime: Flexeril.Continue to monitor.05/29/2020 9.BilateralGreater Trochanteric Tenderness:L>R.Continuecurrent treatmentwith Ice/Heat Therapy.05/29/2020. 10. Polyarthralgia: Rheumatology Following: Continue to monitor.05/29/2020  11. Morbid Obesitity: Continue Healthy Diet Regime and HEP. Continue to monitor.05/29/2020 12. Right Shoulder tendonitis: No complaints today..Continue to Alternate with Heat and Ice Therapy. Continue to monitor. 05/29/2020   F/U in60month

## 2020-06-07 ENCOUNTER — Other Ambulatory Visit: Payer: Medicare Other | Admitting: Nurse Practitioner

## 2020-06-07 ENCOUNTER — Other Ambulatory Visit: Payer: Self-pay

## 2020-06-07 ENCOUNTER — Encounter: Payer: Self-pay | Admitting: Nurse Practitioner

## 2020-06-07 DIAGNOSIS — M069 Rheumatoid arthritis, unspecified: Secondary | ICD-10-CM | POA: Diagnosis not present

## 2020-06-07 DIAGNOSIS — Z515 Encounter for palliative care: Secondary | ICD-10-CM

## 2020-06-07 NOTE — Progress Notes (Addendum)
[facility] Consult Note Telephone: [phone]  Fax: [phone]  PATIENT NAME: [name] DOB: [birth year] MRN: [id number]  PRIMARY CARE PROVIDER:   [name], MD  REFERRING PROVIDER:  [name], MD [place],  [place] [id number]  RESPONSIBLE PARTY:Self; husband [name] Due to the COVID-19 crisis, this visit was done via telemedicine from my office and it was initiated and consent by this patient and or family.  RECOMMENDATIONS and PLAN: 1.ACP: full code currently with aggressive interventions.   2. RA; continue current regimen, pain management  3.Palliative care encounter Palliative medicine team will continue to support patient, patient's family, and medical team. Visit consisted of counseling and education dealing with the complex and emotionally intense issues of symptom management and palliative care in the setting of serious and potentially life-threatening  4. F/u 2 months for ongoing support with ongoing discussions of goc, monitoring chronic disease progression, symptoms  I spent 40 minutes providing this consultation,  Starting at 8:45am More than 50% of the time in this consultation was spent coordinating communication.   HISTORY OF PRESENT ILLNESS:  [name] is a 80 y.o. year old female with multiple medical problems including Chronic diastolic congestive heart failure, pulmonary fibrosis, interstitial lung disease, chronic respiratory failure with hypoxia, dysphasia, aphasia, carotid artery occlusion, COPD,history of DVT, chronic kidney disease, Rheumatoid arthritis, anemia, meningioma of left sphenoid wing involving cavernous sinus, hypothyroidism, hypertension, gerd, spinal stenosis of lumbar region,osteoarthritis, hypercholesterolemia, gout, morbid obesity, arthritis, cataract,chronic pain, history of left hip fracture s/p nail, tonsillectomy, left shoulder  surgery, ovarian surgery, brain surgery, abdominal hysterectomy. I called [name] to confirm follow up Palliative care visit. [name] requested to change Palliative care in person visit to telemedicine telephonic is video not available. We talked about purpose of Palliative care visit. [name] in agreement. We talked about how she is feeling today. [name] endorses she is doing well. [name] feels like things have improved a lot. we talked about symptoms of pain. [name] endorses she still does experience symptoms of pain but it is greatly improved. Nucynta has been discontinued and she went back to her previous pain regiment. [name] endorses this has given her a great relief. We talked about [name] mobility which has increased. We talked about [name] ambulating a few steps more. We talked about [name] daughters and granddaughters helping her. [name] endorse is their Christmas tree is still up but it's not bothering her because when they have time they will be able to take it down. [name] endorses she rarely goes in that room so it is not bothering her. We talked about her appetite which has been good. [name] endorses that she does continue to lose a little bit of weight. Edema has improved. [name] endorses she is sleeping better at night with stopping Nucynta. We talked about sleep hygiene and her sleep patterns. We talked about medical goals of care. [name] talked about her husband who has recently had a hospitalization but is now at home and doing better. We talked about his incontinence that was bothering her which has since improved with him wearing adult diapers. We talked about quality of life. We talked about role of Palliative care and plan of care. We talked about follow-up Palliative care visit in 2 months if needed or sooner should she declined. [name] in agreement appointment scheduled. Therapeutic listening and emotional  support  provided. Contact information. Questions answered to satisfaction.  Palliative Care was asked to help to continue to address goals of care.   CODE STATUS: Full code  PPS: 40% HOSPICE ELIGIBILITY/DIAGNOSIS: TBD  PAST MEDICAL HISTORY:  Past Medical History:  Diagnosis Date  . Acute on chronic diastolic (congestive) heart failure ([place])   . Anemia    iron deficiency anemia - secondary to blood loss ( chronic)   . Arthritis    endstage changes bilateral knees/bilateral ankles.   . Asthma   . Carotid artery occlusion   . Chronic fatigue   . Chronic kidney disease   . Closed left hip fracture ([place])   . Clotting disorder ([place])    pt denies this  . Contusion of left knee    due to fall 1/14.  [name] COPD (chronic obstructive pulmonary disease) ([facility])    pulmonary fibrosis  . Depression, reactive   . Diabetes mellitus    type II   . Diastolic dysfunction   . Difficulty in walking   . Family history of heart disease   . Generalized muscle weakness   . Gout   . High cholesterol   . History of falling   . Hypertension   . Hypothyroidism   . Interstitial lung disease ([place])   . 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    . Morbid obesity ([place])   . Morbid obesity ([place])   . Neuromuscular disorder ([place])    diabetic neuropathy   . Normal coronary arteries    cardiac catheterization performed  10/31/14  . RA (rheumatoid arthritis) ([place])    has been off methotreaxte since 10/13.  [name] Rheumatoid arthritis ([place])   . Shortness of breath   . Spinal stenosis of lumbar region   . Thyroid disease   . Unspecified lack of coordination   . URI (upper respiratory infection)     SOCIAL HX:  Social History   Tobacco Use  . Smoking status: Never Smoker  . Smokeless tobacco: Never Used  Substance Use Topics  . Alcohol use: No    ALLERGIES:  Allergies  Allergen Reactions  . Codeine Swelling and Other  (See Comments)    Facial swelling Chest pain and swelling in legs  Facial swelling  . Infliximab Anaphylaxis    "sent me into shock" "sent me into shock"  . Lisinopril Swelling    Face and neck swelling Face and neck swelling     PERTINENT MEDICATIONS:  Outpatient Encounter Medications as of 06/07/2020  Medication Sig  . acetaminophen (TYLENOL) 500 MG tablet Take 1,000 mg by mouth every 6 (six) hours as needed for moderate pain.   [name] albuterol (PROVENTIL HFA;VENTOLIN HFA) 108 (90 BASE) MCG/ACT inhaler Inhale 2 puffs into the lungs every 6 (six) hours as needed for wheezing or shortness of breath.  . allopurinol (ZYLOPRIM) 300 MG tablet Take 300 mg by mouth daily.  [name] aspirin buffered (BUFFERIN) 325 MG TABS tablet Take 325 mg by mouth daily.  [name] atorvastatin (LIPITOR) 20 MG tablet Take 10 mg by mouth every evening.   . Calcium Carb-Cholecalciferol (CALCIUM + D3 PO) Take 2 tablets by mouth daily with lunch.   . cholecalciferol (VITAMIN D) 1000 UNITS tablet Take 2,000 Units by mouth daily with lunch.   . colchicine 0.6 MG tablet Take 0.6 mg by mouth daily.  . cyclobenzaprine (FLEXERIL) 5 MG tablet Take 1 tablet (5 mg total)  by mouth at bedtime as needed for muscle spasms.  . diclofenac sodium (VOLTAREN) 1 % GEL Apply 2 g topically 3 (three) times daily. Both hands and knees  . DULoxetine (CYMBALTA) 60 MG capsule Take 1 capsule (60 mg total) by mouth daily.  . fluticasone (FLONASE) 50 MCG/ACT nasal spray Place 2 sprays into both nostrils daily.  . furosemide (LASIX) 80 MG tablet Take 80 mg by mouth.  . gabapentin (NEURONTIN) 600 MG tablet Take two in the morning , Two mid- afternoon and two at bedtime  . guaiFENesin (MUCINEX) 600 MG 12 hr tablet Take 1,200 mg by mouth 2 (two) times daily.  . hydroxychloroquine (PLAQUENIL) 200 MG tablet Take 400 mg by mouth daily.   [name] ipratropium-albuterol (DUONEB) 0.5-2.5 (3) MG/3ML SOLN Take 3 mLs by nebulization every 6 (six) hours as needed  (cough/wheezing).   [name] levothyroxine (SYNTHROID) 50 MCG tablet Take 1 tablet (50 mcg total) by mouth daily.  [name] linaclotide (LINZESS) 72 MCG capsule Take 1 capsule (72 mcg total) by mouth daily before breakfast.  . metolazone (ZAROXOLYN) 2.5 MG tablet TAKE 1 TABLET BY MOUTH DAILY AS NEEDED FOR 2 DAYS FOR WEIGHT ABOVE 255 POUNDS  . metoprolol succinate (TOPROL XL) 25 MG 24 hr tablet Take 3 tablets (75 mg total) by mouth daily.  [name] morphine (Joan CONTIN) 15 MG 12 hr tablet Take 1 tablet (15 mg total) by mouth every 12 (twelve) hours.  [name] morphine (MSIR) 15 MG tablet One tablet twice a day as needed for break through pain.  . Multiple Vitamin (MULTIVITAMIN WITH MINERALS) TABS Take 1 tablet by mouth daily.   [name] olmesartan (BENICAR) 40 MG tablet Take 1 tablet (40 mg total) by mouth daily.  . OXYGEN-HELIUM IN Inhale 2 L into the lungs at bedtime. And on exertion  . pantoprazole (PROTONIX) 40 MG tablet take 1 tablet by mouth once daily MINUTES BEFORE 1ST MEAL OF THE DAY  . potassium chloride SA (KLOR-CON) 20 MEQ tablet TAKE 1 TABLET(20 MEQ) BY MOUTH DAILY  . predniSONE (DELTASONE) 5 MG tablet Take 10 mg by mouth every morning.   . Semaglutide,0.25 or 0.5MG/DOS, (OZEMPIC, 0.25 OR 0.5 MG/DOSE,) 2 MG/1.5ML SOPN Inject 0.375 mLs (0.5 mg total) into the skin once a week.  . sodium chloride (OCEAN) 0.65 % SOLN nasal spray Place 1 spray into both nostrils as needed for congestion.  [name] Spacer/Aero-Holding Chambers (AEROCHAMBER MV) inhaler Use as instructed   No facility-administered encounter medications on file as of 06/07/2020.    PHYSICAL EXAM:   Deferred  Zyliah Schier Z Kaytlin Burklow, NP

## 2020-06-11 DIAGNOSIS — M0579 Rheumatoid arthritis with rheumatoid factor of multiple sites without organ or systems involvement: Secondary | ICD-10-CM | POA: Diagnosis not present

## 2020-06-11 DIAGNOSIS — Z6841 Body Mass Index (BMI) 40.0 and over, adult: Secondary | ICD-10-CM | POA: Diagnosis not present

## 2020-06-11 DIAGNOSIS — R0789 Other chest pain: Secondary | ICD-10-CM | POA: Diagnosis not present

## 2020-06-11 DIAGNOSIS — J8410 Pulmonary fibrosis, unspecified: Secondary | ICD-10-CM | POA: Diagnosis not present

## 2020-06-11 DIAGNOSIS — R490 Dysphonia: Secondary | ICD-10-CM | POA: Diagnosis not present

## 2020-06-11 DIAGNOSIS — Z79899 Other long term (current) drug therapy: Secondary | ICD-10-CM | POA: Diagnosis not present

## 2020-06-11 DIAGNOSIS — Z20822 Contact with and (suspected) exposure to covid-19: Secondary | ICD-10-CM | POA: Diagnosis not present

## 2020-06-11 DIAGNOSIS — J479 Bronchiectasis, uncomplicated: Secondary | ICD-10-CM | POA: Diagnosis not present

## 2020-06-11 DIAGNOSIS — R0602 Shortness of breath: Secondary | ICD-10-CM | POA: Diagnosis not present

## 2020-06-11 DIAGNOSIS — J029 Acute pharyngitis, unspecified: Secondary | ICD-10-CM | POA: Diagnosis not present

## 2020-06-14 ENCOUNTER — Other Ambulatory Visit: Payer: Self-pay

## 2020-06-14 ENCOUNTER — Telehealth: Payer: Self-pay | Admitting: Family Medicine

## 2020-06-14 DIAGNOSIS — E119 Type 2 diabetes mellitus without complications: Secondary | ICD-10-CM

## 2020-06-14 DIAGNOSIS — I10 Essential (primary) hypertension: Secondary | ICD-10-CM

## 2020-06-14 MED ORDER — OZEMPIC (0.25 OR 0.5 MG/DOSE) 2 MG/1.5ML ~~LOC~~ SOPN: 0.5000 mg | PEN_INJECTOR | SUBCUTANEOUS | 2 refills | Status: DC

## 2020-06-14 MED ORDER — FLUTICASONE PROPIONATE 50 MCG/ACT NA SUSP: 2.0000 | Freq: Every day | NASAL | 3 refills | Status: DC

## 2020-06-14 MED ORDER — ATORVASTATIN CALCIUM 20 MG PO TABS: 10.0000 mg | ORAL_TABLET | Freq: Every evening | ORAL | 0 refills | Status: DC

## 2020-06-14 MED ORDER — OLMESARTAN MEDOXOMIL 40 MG PO TABS: 40.0000 mg | ORAL_TABLET | Freq: Every day | ORAL | 1 refills | Status: DC

## 2020-06-14 NOTE — Telephone Encounter (Signed)
Pt is calling in needing a refill on the following Rx's semaglutide (OZEMPIC) 0.5 MG, fluticasone (FLONASE) 50 MCG, olmesartan (BENICAR) 40 MG and levothyroxine (SYNTHROID) 50 MCG and is out of the this medication atorvastatin (LIPITOR) 20 MG  Pharm: [facility] [place] [place].

## 2020-06-15 NOTE — Telephone Encounter (Signed)
Requested refills sent to the pharmacy

## 2020-06-25 ENCOUNTER — Other Ambulatory Visit: Payer: Self-pay

## 2020-06-25 ENCOUNTER — Encounter: Payer: Self-pay | Admitting: Family Medicine

## 2020-06-25 ENCOUNTER — Ambulatory Visit (INDEPENDENT_AMBULATORY_CARE_PROVIDER_SITE_OTHER): Payer: Medicare Other | Admitting: Family Medicine

## 2020-06-25 VITALS — BP 138/88 | HR 92 | Temp 98.4°F | Wt 231.0 lb

## 2020-06-25 DIAGNOSIS — J0140 Acute pansinusitis, unspecified: Secondary | ICD-10-CM | POA: Diagnosis not present

## 2020-06-25 DIAGNOSIS — M05741 Rheumatoid arthritis with rheumatoid factor of right hand without organ or systems involvement: Secondary | ICD-10-CM | POA: Diagnosis not present

## 2020-06-25 DIAGNOSIS — M05742 Rheumatoid arthritis with rheumatoid factor of left hand without organ or systems involvement: Secondary | ICD-10-CM | POA: Diagnosis not present

## 2020-06-25 DIAGNOSIS — H6983 Other specified disorders of Eustachian tube, bilateral: Secondary | ICD-10-CM | POA: Diagnosis not present

## 2020-06-25 DIAGNOSIS — J849 Interstitial pulmonary disease, unspecified: Secondary | ICD-10-CM

## 2020-06-25 MED ORDER — AZITHROMYCIN 250 MG PO TABS: ORAL_TABLET | ORAL | 0 refills | Status: DC

## 2020-06-25 NOTE — Patient Instructions (Signed)
Sinusitis, Adult Sinusitis is inflammation of your sinuses. Sinuses are hollow spaces in the bones around your face. Your sinuses are located:  Around your eyes.  In the middle of your forehead.  Behind your nose.  In your cheekbones. Mucus normally drains out of your sinuses. When your nasal tissues become inflamed or swollen, mucus can become trapped or blocked. This allows bacteria, viruses, and fungi to grow, which leads to infection. Most infections of the sinuses are caused by a virus. Sinusitis can develop quickly. It can last for up to 4 weeks (acute) or for more than 12 weeks (chronic). Sinusitis often develops after a cold. What are the causes? This condition is caused by anything that creates swelling in the sinuses or stops mucus from draining. This includes:  Allergies.  Asthma.  Infection from bacteria or viruses.  Deformities or blockages in your nose or sinuses.  Abnormal growths in the nose (nasal polyps).  Pollutants, such as chemicals or irritants in the air.  Infection from fungi (rare). What increases the risk? You are more likely to develop this condition if you:  Have a weak body defense system (immune system).  Do a lot of swimming or diving.  Overuse nasal sprays.  Smoke. What are the signs or symptoms? The main symptoms of this condition are pain and a feeling of pressure around the affected sinuses. Other symptoms include:  Stuffy nose or congestion.  Thick drainage from your nose.  Swelling and warmth over the affected sinuses.  Headache.  Upper toothache.  A cough that may get worse at night.  Extra mucus that collects in the throat or the back of the nose (postnasal drip).  Decreased sense of smell and taste.  Fatigue.  A fever.  Sore throat.  Bad breath. How is this diagnosed? This condition is diagnosed based on:  Your symptoms.  Your medical history.  A physical exam.  Tests to find out if your condition is  acute or chronic. This may include: ? Checking your nose for nasal polyps. ? Viewing your sinuses using a device that has a light (endoscope). ? Testing for allergies or bacteria. ? Imaging tests, such as an MRI or CT scan. In rare cases, a bone biopsy may be done to rule out more serious types of fungal sinus disease. How is this treated? Treatment for sinusitis depends on the cause and whether your condition is chronic or acute.  If caused by a virus, your symptoms should go away on their own within 10 days. You may be given medicines to relieve symptoms. They include: ? Medicines that shrink swollen nasal passages (topical intranasal decongestants). ? Medicines that treat allergies (antihistamines). ? A spray that eases inflammation of the nostrils (topical intranasal corticosteroids). ? Rinses that help get rid of thick mucus in your nose (nasal saline washes).  If caused by bacteria, your health care provider may recommend waiting to see if your symptoms improve. Most bacterial infections will get better without antibiotic medicine. You may be given antibiotics if you have: ? A severe infection. ? A weak immune system.  If caused by narrow nasal passages or nasal polyps, you may need to have surgery. Follow these instructions at home: Medicines  Take, use, or apply over-the-counter and prescription medicines only as told by your health care provider. These may include nasal sprays.  If you were prescribed an antibiotic medicine, take it as told by your health care provider. Do not stop taking the antibiotic even if you start  to feel better. Hydrate and humidify  Drink enough fluid to keep your urine pale yellow. Staying hydrated will help to thin your mucus.  Use a cool mist humidifier to keep the humidity level in your home above 50%.  Inhale steam for 10-15 minutes, 3-4 times a day, or as told by your health care provider. You can do this in the bathroom while a hot shower is  running.  Limit your exposure to cool or dry air.   Rest  Rest as much as possible.  Sleep with your head raised (elevated).  Make sure you get enough sleep each night. General instructions  Apply a warm, moist washcloth to your face 3-4 times a day or as told by your health care provider. This will help with discomfort.  Wash your hands often with soap and water to reduce your exposure to germs. If soap and water are not available, use hand sanitizer.  Do not smoke. Avoid being around people who are smoking (secondhand smoke).  Keep all follow-up visits as told by your health care provider. This is important.   Contact a health care provider if:  You have a fever.  Your symptoms get worse.  Your symptoms do not improve within 10 days. Get help right away if:  You have a severe headache.  You have persistent vomiting.  You have severe pain or swelling around your face or eyes.  You have vision problems.  You develop confusion.  Your neck is stiff.  You have trouble breathing. Summary  Sinusitis is soreness and inflammation of your sinuses. Sinuses are hollow spaces in the bones around your face.  This condition is caused by nasal tissues that become inflamed or swollen. The swelling traps or blocks the flow of mucus. This allows bacteria, viruses, and fungi to grow, which leads to infection.  If you were prescribed an antibiotic medicine, take it as told by your health care provider. Do not stop taking the antibiotic even if you start to feel better.  Keep all follow-up visits as told by your health care provider. This is important. This information is not intended to replace advice given to you by your health care provider. Make sure you discuss any questions you have with your health care provider. Document Revised: 10/05/2017 Document Reviewed: 10/05/2017 Elsevier Patient Education  [place].  Eustachian Tube Dysfunction  Eustachian tube  dysfunction refers to a condition in which a blockage develops in the narrow passage that connects the middle ear to the back of the nose (eustachian tube). The eustachian tube regulates air pressure in the middle ear by letting air move between the ear and nose. It also helps to drain fluid from the middle ear space. Eustachian tube dysfunction can affect one or both ears. When the eustachian tube does not function properly, air pressure, fluid, or both can build up in the middle ear. What are the causes? This condition occurs when the eustachian tube becomes blocked or cannot open normally. Common causes of this condition include:  Ear infections.  Colds and other infections that affect the nose, mouth, and throat (upper respiratory tract).  Allergies.  Irritation from cigarette smoke.  Irritation from stomach acid coming up into the esophagus (gastroesophageal reflux). The esophagus is the tube that carries food from the mouth to the stomach.  Sudden changes in air pressure, such as from descending in an airplane or scuba diving.  Abnormal growths in the nose or throat, such as: ? Growths that line  the nose (nasal polyps). ? Abnormal growth of cells (tumors). ? Enlarged tissue at the back of the throat (adenoids). What increases the risk? You are more likely to develop this condition if:  You smoke.  You are overweight.  You are a child who has: ? Certain birth defects of the mouth, such as cleft palate. ? Large tonsils or adenoids. What are the signs or symptoms? Common symptoms of this condition include:  A feeling of fullness in the ear.  Ear pain.  Clicking or popping noises in the ear.  Ringing in the ear.  Hearing loss.  Loss of balance.  Dizziness. Symptoms may get worse when the air pressure around you changes, such as when you travel to an area of high elevation, fly on an airplane, or go scuba diving. How is this diagnosed? This condition may be diagnosed  based on:  Your symptoms.  A physical exam of your ears, nose, and throat.  Tests, such as those that measure: ? The movement of your eardrum (tympanogram). ? Your hearing (audiometry). How is this treated? Treatment depends on the cause and severity of your condition.  In mild cases, you may relieve your symptoms by moving air into your ears. This is called "popping the ears."  In more severe cases, or if you have symptoms of fluid in your ears, treatment may include: ? Medicines to relieve congestion (decongestants). ? Medicines that treat allergies (antihistamines). ? Nasal sprays or ear drops that contain medicines that reduce swelling (steroids). ? A procedure to drain the fluid in your eardrum (myringotomy). In this procedure, a small tube is placed in the eardrum to:  Drain the fluid.  Restore the air in the middle ear space. ? A procedure to insert a balloon device through the nose to inflate the opening of the eustachian tube (balloon dilation). Follow these instructions at home: Lifestyle  Do not do any of the following until your health care provider approves: ? Travel to high altitudes. ? Fly in airplanes. ? Work in a Pension scheme manager or room. ? Scuba dive.  Do not use any products that contain nicotine or tobacco, such as cigarettes and e-cigarettes. If you need help quitting, ask your health care provider.  Keep your ears dry. Wear fitted earplugs during showering and bathing. Dry your ears completely after. General instructions  Take over-the-counter and prescription medicines only as told by your health care provider.  Use techniques to help pop your ears as recommended by your health care provider. These may include: ? Chewing gum. ? Yawning. ? Frequent, forceful swallowing. ? Closing your mouth, holding your nose closed, and gently blowing as if you are trying to blow air out of your nose.  Keep all follow-up visits as told by your health care  provider. This is important. Contact a health care provider if:  Your symptoms do not go away after treatment.  Your symptoms come back after treatment.  You are unable to pop your ears.  You have: ? A fever. ? Pain in your ear. ? Pain in your head or neck. ? Fluid draining from your ear.  Your hearing suddenly changes.  You become very dizzy.  You lose your balance. Summary  Eustachian tube dysfunction refers to a condition in which a blockage develops in the eustachian tube.  It can be caused by ear infections, allergies, inhaled irritants, or abnormal growths in the nose or throat.  Symptoms include ear pain, hearing loss, or ringing in the ears.  Mild cases are treated with maneuvers to unblock the ears, such as yawning or ear popping.  Severe cases are treated with medicines. Surgery may also be done (rare). This information is not intended to replace advice given to you by your health care provider. Make sure you discuss any questions you have with your health care provider. Document Revised: 08/25/2017 Document Reviewed: 08/25/2017 Elsevier Patient Education  [place].

## 2020-06-25 NOTE — Progress Notes (Signed)
Subjective:    Patient ID: [name], female    DOB: 11-Oct-1940, 80 y.o.   MRN: [id number]  No chief complaint on file. Pt accompanied by her daughter, [name].  HPI Pt is a 80 yo female with pmh sig for HTN, h/o DVT, interstitial lung disease, pulmonary fibrosis, acute on chronic diastolic CHF, COPD, bronchiectasis, GERD, dysphagia, drug-induced constipation, DM 2 with neuropathy, hypothyroidism, spinal stenosis with radiculopathy, RA (RF+,CCP+; txtd  W/ MTX, Remicaid, [place] in past yrs, on daily prednisone), arthritis, CKD 3, obesity, h/o L femur and hip fx, meningioma of left sphenoid wing who was seen today for acute concern.    Pt endorses b/l neck pain going behind ears and up side of head x 1 wk.  Pt endorses ear pain/pressure and pain/pressure in upper gums.  Has partial dentures.  Pt had a recent f/u with [facility] interstitial lung disease program.  Patient noted with bronchiectasis.  Was previously on prophylactic dose of azithromycin and 250 mg 3 times per week, but was somehow taken off of the med.  Azithromycin was restarted daily for 7 days then pt went to the 3x/wk dosing.  She has been on this x 2 wks.  Also on prednisone 10 mg daily for RA.  Past Medical History:  Diagnosis Date  . Acute on chronic diastolic (congestive) heart failure ([place])   . Anemia    iron deficiency anemia - secondary to blood loss ( chronic)   . Arthritis    endstage changes bilateral knees/bilateral ankles.   . Asthma   . Carotid artery occlusion   . Chronic fatigue   . Chronic kidney disease   . Closed left hip fracture ([place])   . Clotting disorder ([place])    pt denies this  . Contusion of left knee    due to fall 1/14.  [name] COPD (chronic obstructive pulmonary disease) (HCC)    pulmonary fibrosis  . Depression, reactive   . Diabetes mellitus    type II   . Diastolic dysfunction   . Difficulty in walking   . Family history of heart disease   . Generalized muscle weakness   . Gout   . High  cholesterol   . History of falling   . Hypertension   . Hypothyroidism   . Interstitial lung disease ([place])   . 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    . Morbid obesity ([place])   . Morbid obesity ([place])   . Neuromuscular disorder ([place])    diabetic neuropathy   . Normal coronary arteries    cardiac catheterization performed  10/31/14  . RA (rheumatoid arthritis) ([place])    has been off methotreaxte since 10/13.  [name] Rheumatoid arthritis ([place])   . Shortness of breath   . Spinal stenosis of lumbar region   . Thyroid disease   . Unspecified lack of coordination   . URI (upper respiratory infection)     Allergies  Allergen Reactions  . Codeine Swelling and Other (See Comments)    Facial swelling Chest pain and swelling in legs  Facial swelling  . Infliximab Anaphylaxis    "sent me into shock" "sent me into shock"  . Lisinopril Swelling    Face and neck swelling Face and neck swelling    ROS General: Denies fever, chills, night sweats, changes in weight, changes in appetite HEENT: Denies changes in vision, rhinorrhea, sore throat  +b/l neck, ear,  and facial pain/pressure, headache CV: Denies CP, palpitations, SOB, orthopnea Pulm: Denies SOB, cough, wheezing  GI: Denies abdominal pain, nausea, vomiting, diarrhea, constipation GU: Denies dysuria, hematuria, frequency, vaginal discharge Msk: Denies muscle cramps, joint pains Neuro: Denies weakness, numbness, tingling Skin: Denies rashes, bruising Psych: Denies depression, anxiety, hallucinations     Objective:    Blood pressure 138/88, pulse 92, temperature 98.4 F (36.9 C), temperature source Oral, weight 231 lb (104.8 kg), SpO2 96 %.  Gen. Pleasant, well-nourished, in no distress, normal affect   HEENT: /AT, face symmetric, conjunctiva clear, no scleral icterus, PERRLA, EOMI, nares patent without drainage, partial upper denture in  place, pharynx without erythema or exudate.  Mild cervical lymphadenopathy b/l, TMs full bilaterally.  TTP of left maxillary sinus and frontal sinuses. Lungs: no accessory muscle use, CTAB, no wheezes or rales Cardiovascular: RRR, no m/r/g, no peripheral edema Abdomen: BS present, soft, NT/ND, no hepatosplenomegaly. Musculoskeletal: No deformities, no cyanosis or clubbing, normal tone Neuro:  A&Ox3, CN II-XII intact, normal gait Skin:  Warm, no lesions/ rash   Wt Readings from Last 3 Encounters:  06/25/20 231 lb (104.8 kg)  05/29/20 235 lb 3.2 oz (106.7 kg)  04/30/20 238 lb 12.8 oz (108.3 kg)    Lab Results  Component Value Date   WBC 6.7 11/10/2019   HGB 12.4 11/10/2019   HCT 41.8 11/10/2019   PLT 181 11/10/2019   GLUCOSE 110 (H) 11/10/2019   CHOL 161 09/22/2019   TRIG 194.0 (H) 09/22/2019   HDL 47.20 09/22/2019   LDLCALC 75 09/22/2019   ALT 36 04/21/2019   AST 33 04/21/2019   NA 144 11/10/2019   K 4.3 11/10/2019   CL 103 11/10/2019   CREATININE 1.22 (H) 11/10/2019   BUN 12 11/10/2019   CO2 31 11/10/2019   TSH 1.84 09/22/2019   INR 1.05 11/29/2016   HGBA1C 7.0 (H) 09/22/2019    Assessment/Plan:  Acute pansinusitis, recurrence not specified -Allergies reviewed. -Discussed concern for QTC prolongation current medications and certain antibiotics -We will restart azithromycin daily x1 week.  Patient then can return to 3 times weekly dosing. -Discussed may need different antibiotic to treat infection if symptoms continue. -Given precautions - Plan: azithromycin (ZITHROMAX) 250 MG tablet  Dysfunction of both eustachian tubes -Continue Flonase -Consider saline nasal rinse -Given handout  ILD (interstitial lung disease) ([place]) -Improving since restarting azithromycin -Continue bronchiectasis treatment including Acapella, nebulizer daily, 3 times weekly azithromycin. -We will restart 3 times weekly azithromycin after completion of 7 d course for current  sinusitis -Continue follow-up with [facility] program and [name], [place]  Rheumatoid arthritis involving both hands with positive rheumatoid factor (HCC) -Stable -Continue prednisone 10 mg daily -Continue follow-up with rheumatology, Dr. Toy Care, [place], [place] [facility] and Dr. Estanislado Pandy, [facility]  F/u as needed  Grier Mitts, MD

## 2020-06-29 ENCOUNTER — Telehealth: Payer: Self-pay | Admitting: Registered Nurse

## 2020-06-29 DIAGNOSIS — R5382 Chronic fatigue, unspecified: Secondary | ICD-10-CM

## 2020-06-29 MED ORDER — DULOXETINE HCL 60 MG PO CPEP: 60.0000 mg | ORAL_CAPSULE | Freq: Every day | ORAL | 0 refills | Status: DC

## 2020-06-29 NOTE — Telephone Encounter (Signed)
[name] called, she states she is awaiting her shipment of Cymbalta from the [place]. We will send in a 10 day supply, she verbalizes understanding.

## 2020-07-11 ENCOUNTER — Ambulatory Visit (INDEPENDENT_AMBULATORY_CARE_PROVIDER_SITE_OTHER): Payer: Medicare Other | Admitting: Cardiovascular Disease

## 2020-07-11 ENCOUNTER — Other Ambulatory Visit: Payer: Self-pay

## 2020-07-11 ENCOUNTER — Encounter: Payer: Self-pay | Admitting: Cardiovascular Disease

## 2020-07-11 DIAGNOSIS — R600 Localized edema: Secondary | ICD-10-CM

## 2020-07-11 DIAGNOSIS — I5033 Acute on chronic diastolic (congestive) heart failure: Secondary | ICD-10-CM

## 2020-07-11 DIAGNOSIS — E785 Hyperlipidemia, unspecified: Secondary | ICD-10-CM

## 2020-07-11 DIAGNOSIS — I10 Essential (primary) hypertension: Secondary | ICD-10-CM | POA: Diagnosis not present

## 2020-07-11 NOTE — Assessment & Plan Note (Signed)
Acute on chronic diastolic heart failure on metolazone and furosemide.

## 2020-07-11 NOTE — Assessment & Plan Note (Signed)
Bilateral lower extremity edema on furosemide and as needed metolazone.

## 2020-07-11 NOTE — Patient Instructions (Signed)
Medication Instructions:  Your physician recommends that you continue on your current medications as directed. Please refer to the Current Medication list given to you today.  *If you need a refill on your cardiac medications before your next appointment, please call your pharmacy*   Follow-Up: At [facility], you and your health needs are our priority.  As part of our continuing mission to provide you with exceptional heart care, we have created designated Provider Care Teams.  These Care Teams include your primary Cardiologist (physician) and Advanced Practice Providers (APPs -  Physician Assistants and Nurse Practitioners) who all work together to provide you with the care you need, when you need it.  We recommend signing up for the patient portal called "MyChart".  Sign up information is provided on this After Visit Summary.  MyChart is used to connect with patients for Virtual Visits (Telemedicine).  Patients are able to view lab/test results, encounter notes, upcoming appointments, etc.  Non-urgent messages can be sent to your provider as well.   To learn more about what you can do with MyChart, go to [email or web address].    Your next appointment:   6 month(s)  The format for your next appointment:   In Person  Provider:   You will see one of the following Advanced Practice Providers on your designated Care Team:    Kerin Ransom, PA-C  [place], [place]  Coletta Memos, [place]  Then, Quay Burow, MD will plan to see you again in 12 month(s).

## 2020-07-11 NOTE — Progress Notes (Signed)
07/11/2020 [name]   1940-06-04  [id number]  Primary Physician [name], MD Primary Cardiologist: Lorretta Harp MD [name], [place]  HPI:  [name] is a 80 y.o.  moderately overweight married African American female mother of 6 children, grandmother of a grandchildren referred by [name] for cardiovascular evaluation because of progressive dyspnea on exertion and chest pain. I last saw her in the office 01/11/2020.[name]  She is accompanied by her daughter [name] today..She did see Kerin Ransom, PA-C in the office 11/16/2019.[name] Her cardiovascular risk factor are notable for a strong family history of heart disease with her mother and 36 brothers old with an ischemic heart disease. She has never had a heart attack or stroke. She does have interstitial lung disease as well as rheumatoid arthritis. She is on home oxygen. She also has a brain tumor/meningioma 2 with gamma knife therapy She's had progressive disabling dyspnea as well as daily chest pain. She underwent cardiac catheterization 10/31/14 by Dr. Irish Lack revealing normal coronary arteries and normal LV function. She did have mild pulmonary hypertension. 2-D echo showed normal LV function with grade 1 diastolic dysfunction and mild pulmonary hypertension. Her symptoms really have not changed on by mouth diuretics. She was referred to [facility] for further evaluation and treatment of interstitial lung disease. Since beginning an antibiotic and an inhaled bronchodilator her shortness of breath is somewhat improved.  She had an episode of syncope prior to seeing Kerin Ransom thought to be related to overmedication and dehydration from poor p.o. intake. Her meds were adjusted. She was also hospitalized last month with sepsis related to a tooth infection with and was in the hospital for 10 days. She had fluid overload and has since diuresed. She gets occasional atypical chest pain. Her  blood pressure is mildly elevated when she takes it at home. She is on furosemide 60 mg a day.  She had been seeing one of our our Pharm.D. for medication titration for hypertension which is under better control.   She had noticed increasing weight gain lower extremity edema as well as some atypical chest pain and left flank pain. She did see her nephrologist who put her on antibiotic for presumed UTI and increased her diuretic from furosemide 60 to 80 mg a day. Her symptoms sound like she has an "upper urinary tract infection" which may have exacerbated her diastolic heart failure. Her nephrologist is following her volume status and renal function closely.  Since I saw her 6 months ago she continues to do well.  She is on oral diuretics.  Her weight is stable.  She denies chest pain or shortness of breath.   Current Meds  Medication Sig  . acetaminophen (TYLENOL) 500 MG tablet Take 1,000 mg by mouth every 6 (six) hours as needed for moderate pain.   [name] albuterol (PROVENTIL HFA;VENTOLIN HFA) 108 (90 BASE) MCG/ACT inhaler Inhale 2 puffs into the lungs every 6 (six) hours as needed for wheezing or shortness of breath.  . allopurinol (ZYLOPRIM) 300 MG tablet Take 300 mg by mouth daily.  [name] aspirin buffered (BUFFERIN) 325 MG TABS tablet Take 325 mg by mouth daily.  [name] atorvastatin (LIPITOR) 20 MG tablet Take 0.5 tablets (10 mg total) by mouth every evening.  [name] azithromycin (ZITHROMAX) 250 MG tablet Take 250 mg by mouth 3 (three) times a week. Take 3 times weekly Mon,Wed, Friday / pt reported taking  . azithromycin (ZITHROMAX) 250 MG tablet  Take 2 tabs on day one.  Then take 1 tab daily on days 2-7.  [name] Calcium Carb-Cholecalciferol (CALCIUM + D3 PO) Take 2 tablets by mouth daily with lunch.   . cholecalciferol (VITAMIN D) 1000 UNITS tablet Take 2,000 Units by mouth daily with lunch.   . colchicine 0.6 MG tablet Take 0.6 mg by mouth daily.  . cyclobenzaprine (FLEXERIL) 5 MG tablet Take 1 tablet (5 mg  total) by mouth at bedtime as needed for muscle spasms.  . diclofenac sodium (VOLTAREN) 1 % GEL Apply 2 g topically 3 (three) times daily. Both hands and knees  . DULoxetine (CYMBALTA) 60 MG capsule Take 1 capsule (60 mg total) by mouth daily.  . fluticasone (FLONASE) 50 MCG/ACT nasal spray Place 2 sprays into both nostrils daily.  . furosemide (LASIX) 80 MG tablet Take 80 mg by mouth.  . gabapentin (NEURONTIN) 600 MG tablet Take two in the morning , Two mid- afternoon and two at bedtime  . guaiFENesin (MUCINEX) 600 MG 12 hr tablet Take 1,200 mg by mouth 2 (two) times daily.  . hydroxychloroquine (PLAQUENIL) 200 MG tablet Take 400 mg by mouth daily.   [name] ipratropium-albuterol (DUONEB) 0.5-2.5 (3) MG/3ML SOLN Take 3 mLs by nebulization every 6 (six) hours as needed (cough/wheezing).   [name] levothyroxine (SYNTHROID) 50 MCG tablet Take 1 tablet (50 mcg total) by mouth daily.  [name] linaclotide (LINZESS) 72 MCG capsule Take 1 capsule (72 mcg total) by mouth daily before breakfast.  . metolazone (ZAROXOLYN) 2.5 MG tablet TAKE 1 TABLET BY MOUTH DAILY AS NEEDED FOR 2 DAYS FOR WEIGHT ABOVE 255 POUNDS  . metoprolol succinate (TOPROL XL) 25 MG 24 hr tablet Take 3 tablets (75 mg total) by mouth daily.  [name] morphine (MS CONTIN) 15 MG 12 hr tablet Take 1 tablet (15 mg total) by mouth every 12 (twelve) hours.  [name] morphine (MSIR) 15 MG tablet One tablet twice a day as needed for break through pain.  . Multiple Vitamin (MULTIVITAMIN WITH MINERALS) TABS Take 1 tablet by mouth daily.   [name] olmesartan (BENICAR) 40 MG tablet Take 1 tablet (40 mg total) by mouth daily.  . OXYGEN-HELIUM IN Inhale 2 L into the lungs at bedtime. And on exertion  . pantoprazole (PROTONIX) 40 MG tablet take 1 tablet by mouth once daily MINUTES BEFORE 1ST MEAL OF THE DAY  . potassium chloride SA (KLOR-CON) 20 MEQ tablet TAKE 1 TABLET(20 MEQ) BY MOUTH DAILY  . predniSONE (DELTASONE) 5 MG tablet Take 10 mg by mouth every morning.   . Semaglutide,0.25 or  0.5MG/DOS, (OZEMPIC, 0.25 OR 0.5 MG/DOSE,) 2 MG/1.5ML SOPN Inject 0.5 mg into the skin once a week.  . sodium chloride (OCEAN) 0.65 % SOLN nasal spray Place 1 spray into both nostrils as needed for congestion.  [name] Spacer/Aero-Holding Chambers (AEROCHAMBER MV) inhaler Use as instructed     Allergies  Allergen Reactions  . Codeine Swelling and Other (See Comments)    Facial swelling Chest pain and swelling in legs  Facial swelling  . Infliximab Anaphylaxis    "sent me into shock" "sent me into shock"  . Lisinopril Swelling    Face and neck swelling Face and neck swelling    Social History   Socioeconomic History  . Marital status: Married    Spouse name: Not on file  . Number of children: 6  . Years of education: college  . Highest education level: Not on file  Occupational History  . Occupation: retired Marine scientist  Tobacco  Use  . Smoking status: Never Smoker  . Smokeless tobacco: Never Used  Vaping Use  . Vaping Use: Never used  Substance and Sexual Activity  . Alcohol use: No  . Drug use: No  . Sexual activity: Not Currently  Other Topics Concern  . Not on file  Social History Narrative   Patient consumes 2-3 cups coffee per day.   Social Determinants of Health   Financial Resource Strain: Not on file  Food Insecurity: Not on file  Transportation Needs: Not on file  Physical Activity: Not on file  Stress: Not on file  Social Connections: Not on file  Intimate Partner Violence: Not on file     Review of Systems: General: negative for chills, fever, night sweats or weight changes.  Cardiovascular: negative for chest pain, dyspnea on exertion, edema, orthopnea, palpitations, paroxysmal nocturnal dyspnea or shortness of breath Dermatological: negative for rash Respiratory: negative for cough or wheezing Urologic: negative for hematuria Abdominal: negative for nausea, vomiting, diarrhea, bright red blood per rectum, melena, or hematemesis Neurologic: negative for  visual changes, syncope, or dizziness All other systems reviewed and are otherwise negative except as noted above.    Blood pressure 133/90, pulse 92, height _0  (1.626 m), weight 236 lb 6.4 oz (107.2 kg), SpO2 98 %.  General appearance: alert Neck: no adenopathy, no carotid bruit, no JVD, supple, symmetrical, trachea midline and thyroid not enlarged, symmetric, no tenderness/mass/nodules Lungs: clear to auscultation bilaterally Heart: regular rate and rhythm, S1, S2 normal, no murmur, click, rub or gallop Extremities: 1-2+ pitting edema bilaterally Pulses: 2+ and symmetric Skin: Skin color, texture, turgor normal. No rashes or lesions Neurologic: Alert and oriented X 3, normal strength and tone. Normal symmetric reflexes. Normal coordination and gait  EKG sinus rhythm at 92 with left axis deviation.  I personally reviewed this EKG.  ASSESSMENT AND PLAN:   Dyslipidemia History of dyslipidemia on statin therapy with lipid profile performed 09/22/2019 revealing total cholesterol 161, LDL 75 and HDL of 47.  Essential hypertension History of essential hypertension a blood pressure measured today 133/90.  She is on metoprolol and Benicar.  Bilateral edema of lower extremity Bilateral lower extremity edema on furosemide and as needed metolazone.  Acute on chronic diastolic heart failure (HCC) Acute on chronic diastolic heart failure on metolazone and furosemide.      Lorretta Harp MD FACP,FACC,FAHA, [facility] 07/11/2020 2:51 PM

## 2020-07-11 NOTE — Assessment & Plan Note (Signed)
History of dyslipidemia on statin therapy with lipid profile performed 09/22/2019 revealing total cholesterol 161, LDL 75 and HDL of 47.

## 2020-07-11 NOTE — Assessment & Plan Note (Signed)
History of essential hypertension a blood pressure measured today 133/90.  She is on metoprolol and Benicar.

## 2020-07-24 ENCOUNTER — Other Ambulatory Visit: Payer: Medicare Other | Admitting: Nurse Practitioner

## 2020-07-24 ENCOUNTER — Encounter: Payer: Self-pay | Admitting: Nurse Practitioner

## 2020-07-24 ENCOUNTER — Other Ambulatory Visit: Payer: Self-pay

## 2020-07-24 DIAGNOSIS — M069 Rheumatoid arthritis, unspecified: Secondary | ICD-10-CM | POA: Diagnosis not present

## 2020-07-24 DIAGNOSIS — Z515 Encounter for palliative care: Secondary | ICD-10-CM

## 2020-07-24 NOTE — Progress Notes (Signed)
[facility] Consult Note Telephone: [phone]  Fax: [phone]  PATIENT NAME: [name] DOB: 10/04/1940 MRN: [id number]  PRIMARY CARE PROVIDER:   [name], MD  REFERRING PROVIDER:  [name], MD [place],  [place] [address]   Due to the COVID-19 crisis, this visit was done via telemedicine from my office and it was initiated and consent by this patient and or family  RESPONSIBLE PARTY:Self; husband [name]  RECOMMENDATIONS and PLAN: 1.ACP: full code currently with aggressive interventions.   2. RA; continue current regimen, pain management  3.Palliative care encounter Palliative medicine team will continue to support patient, patient's family, and medical team. Visit consisted of counseling and education dealing with the complex and emotionally intense issues of symptom management and palliative care in the setting of serious and potentially life-threatening  4. F/u 2 months for ongoing support with ongoing discussions of goc, monitoring chronic disease progression, symptoms  I spent 50 minutes providing this consultation,  from 11:30am to 12:20pm. More than 50% of the time in this consultation was spent coordinating communication.   HISTORY OF PRESENT ILLNESS:  [name] is a 80 y.o. year old female with multiple medical problems including Chronic diastolic congestive heart failure, pulmonary fibrosis, interstitial lung disease, chronic respiratory failure with hypoxia, dysphasia, aphasia, carotid artery occlusion, COPD,history of DVT, chronic kidney disease, Rheumatoid arthritis, anemia, meningioma of left sphenoid wing involving cavernous sinus, hypothyroidism, hypertension, gerd, spinal stenosis of lumbar region,osteoarthritis, hypercholesterolemia, gout, morbid obesity, arthritis, cataract,chronic pain, history of left hip fracture s/p nail, tonsillectomy, left  shoulder surgery, ovarian surgery, brain surgery, abdominal hysterectomy. I called Ms. Krysiak to confirm PC f/u visit and screen for covid. Ms. Apple endorses wishes to change from face to face visit to telemedicine, telephonic as video not available. We talked about purpose of PC visit. Ms. Grenz in agreement. We talked about how Ms. Stimson has been feeling. Ms. Deprey endorses she is doing well, went out to eat with her husband a few days ago in the community. We talked about increase in mobility, few more steps. We talked about Ms. Mckendree wishes are to be more mobile, increase core strength to be able to better hold her up when she is trying to stand. We talked about in home physical therapy option. Ms. Wilfong in agreement to initiate PT in home assessment,will notify Dr Volanda Napoleon need for order for PT. We talked about pain, current regimen effective. Ms. Nash talked about her cardiology appointment which was stable, she does not need to return for a year. Edema improved. Ms. Emmer talked about pulmonology f/u visit with improved shortness of breath. Ms. Ghattas endorses she was put back on azithromycin for increase mucous production; extended time for extensive bronchiectasis/pulmonary fibrosis (She has evidence of cylindrical bronchiectasis (possibly with a traction component) on all prior CT scans, This bronchiectasis is likely secondary to her underlying rheumatoid arthritis). Continued acapella and nebulizer treatments. No recent hospitalization. Medical goals reviewed, continue full code, aggressive interventions. We talked about Ms. Percifield appetite which has improved, eating better foods. We talked about weight is stable. We talked about quality of life, Ms. Krah endorses very grateful she is feeling better. We talked about role of Palliative care in plan of care, will f/u 2 months if needed or sooner if declines. Ms. Mcclish in agreement, appointment scheduled for ongoing monitoring disease  progression, complex medical decision making, symptoms controlled, supportive role. Ms. Schussler endorses  thankful for PC visit, was helpful. Questions answered.   Palliative Care was asked to help to continue to address goals of care.   CODE STATUS: Full code  PPS: 50% HOSPICE ELIGIBILITY/DIAGNOSIS: TBD  PAST MEDICAL HISTORY:  Past Medical History:  Diagnosis Date  . Acute on chronic diastolic (congestive) heart failure ([place])   . Anemia    iron deficiency anemia - secondary to blood loss ( chronic)   . Arthritis    endstage changes bilateral knees/bilateral ankles.   . Asthma   . Carotid artery occlusion   . Chronic fatigue   . Chronic kidney disease   . Closed left hip fracture ([place])   . Clotting disorder ([place])    pt denies this  . Contusion of left knee    due to fall 1/14.  [name] COPD (chronic obstructive pulmonary disease) (HCC)    pulmonary fibrosis  . Depression, reactive   . Diabetes mellitus    type II   . Diastolic dysfunction   . Difficulty in walking   . Family history of heart disease   . Generalized muscle weakness   . Gout   . High cholesterol   . History of falling   . Hypertension   . Hypothyroidism   . Interstitial lung disease ([place])   . 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    . Morbid obesity ([place])   . Morbid obesity ([place])   . Neuromuscular disorder ([place])    diabetic neuropathy   . Normal coronary arteries    cardiac catheterization performed  10/31/14  . RA (rheumatoid arthritis) ([place])    has been off methotreaxte since 10/13.  [name] Rheumatoid arthritis ([place])   . Shortness of breath   . Spinal stenosis of lumbar region   . Thyroid disease   . Unspecified lack of coordination   . URI (upper respiratory infection)     SOCIAL HX:  Social History   Tobacco Use  . Smoking status: Never Smoker  . Smokeless tobacco: Never Used  Substance Use Topics  .  Alcohol use: No    ALLERGIES:  Allergies  Allergen Reactions  . Codeine Swelling and Other (See Comments)    Facial swelling Chest pain and swelling in legs  Facial swelling  . Infliximab Anaphylaxis    "sent me into shock" "sent me into shock"  . Lisinopril Swelling    Face and neck swelling Face and neck swelling     PERTINENT MEDICATIONS:  Outpatient Encounter Medications as of 07/24/2020  Medication Sig  . acetaminophen (TYLENOL) 500 MG tablet Take 1,000 mg by mouth every 6 (six) hours as needed for moderate pain.   [name] albuterol (PROVENTIL HFA;VENTOLIN HFA) 108 (90 BASE) MCG/ACT inhaler Inhale 2 puffs into the lungs every 6 (six) hours as needed for wheezing or shortness of breath.  . allopurinol (ZYLOPRIM) 300 MG tablet Take 300 mg by mouth daily.  [name] aspirin buffered (BUFFERIN) 325 MG TABS tablet Take 325 mg by mouth daily.  [name] atorvastatin (LIPITOR) 20 MG tablet Take 0.5 tablets (10 mg total) by mouth every evening.  [name] azithromycin (ZITHROMAX) 250 MG tablet Take 250 mg by mouth 3 (three) times a week. Take 3 times weekly Mon,Wed, Friday / pt reported taking  . azithromycin (ZITHROMAX) 250 MG tablet Take 2 tabs on day one.  Then take 1 tab daily on days 2-7.  [name] Calcium Carb-Cholecalciferol (CALCIUM + D3 PO)  Take 2 tablets by mouth daily with lunch.   . cholecalciferol (VITAMIN D) 1000 UNITS tablet Take 2,000 Units by mouth daily with lunch.   . colchicine 0.6 MG tablet Take 0.6 mg by mouth daily.  . cyclobenzaprine (FLEXERIL) 5 MG tablet Take 1 tablet (5 mg total) by mouth at bedtime as needed for muscle spasms.  . diclofenac sodium (VOLTAREN) 1 % GEL Apply 2 g topically 3 (three) times daily. Both hands and knees  . DULoxetine (CYMBALTA) 60 MG capsule Take 1 capsule (60 mg total) by mouth daily.  . fluticasone (FLONASE) 50 MCG/ACT nasal spray Place 2 sprays into both nostrils daily.  . furosemide (LASIX) 80 MG tablet Take 80 mg by mouth.  . gabapentin (NEURONTIN) 600 MG tablet  Take two in the morning , Two mid- afternoon and two at bedtime  . guaiFENesin (MUCINEX) 600 MG 12 hr tablet Take 1,200 mg by mouth 2 (two) times daily.  . hydroxychloroquine (PLAQUENIL) 200 MG tablet Take 400 mg by mouth daily.   [name] ipratropium-albuterol (DUONEB) 0.5-2.5 (3) MG/3ML SOLN Take 3 mLs by nebulization every 6 (six) hours as needed (cough/wheezing).   [name] levothyroxine (SYNTHROID) 50 MCG tablet Take 1 tablet (50 mcg total) by mouth daily.  [name] linaclotide (LINZESS) 72 MCG capsule Take 1 capsule (72 mcg total) by mouth daily before breakfast.  . metolazone (ZAROXOLYN) 2.5 MG tablet TAKE 1 TABLET BY MOUTH DAILY AS NEEDED FOR 2 DAYS FOR WEIGHT ABOVE 255 POUNDS  . metoprolol succinate (TOPROL XL) 25 MG 24 hr tablet Take 3 tablets (75 mg total) by mouth daily.  [name] morphine (MS CONTIN) 15 MG 12 hr tablet Take 1 tablet (15 mg total) by mouth every 12 (twelve) hours.  [name] morphine (MSIR) 15 MG tablet One tablet twice a day as needed for break through pain.  . Multiple Vitamin (MULTIVITAMIN WITH MINERALS) TABS Take 1 tablet by mouth daily.   [name] olmesartan (BENICAR) 40 MG tablet Take 1 tablet (40 mg total) by mouth daily.  . OXYGEN-HELIUM IN Inhale 2 L into the lungs at bedtime. And on exertion  . pantoprazole (PROTONIX) 40 MG tablet take 1 tablet by mouth once daily MINUTES BEFORE 1ST MEAL OF THE DAY  . potassium chloride SA (KLOR-CON) 20 MEQ tablet TAKE 1 TABLET(20 MEQ) BY MOUTH DAILY  . predniSONE (DELTASONE) 5 MG tablet Take 10 mg by mouth every morning.   . Semaglutide,0.25 or 0.5MG/DOS, (OZEMPIC, 0.25 OR 0.5 MG/DOSE,) 2 MG/1.5ML SOPN Inject 0.5 mg into the skin once a week.  . sodium chloride (OCEAN) 0.65 % SOLN nasal spray Place 1 spray into both nostrils as needed for congestion.  [name] Spacer/Aero-Holding Chambers (AEROCHAMBER MV) inhaler Use as instructed   No facility-administered encounter medications on file as of 07/24/2020.    PHYSICAL EXAM:   Deferred  Christin Z Gusler, NP

## 2020-07-25 ENCOUNTER — Encounter: Payer: Self-pay | Admitting: Registered Nurse

## 2020-07-25 ENCOUNTER — Encounter: Payer: Medicare Other | Attending: Physical Medicine & Rehabilitation | Admitting: Registered Nurse

## 2020-07-25 ENCOUNTER — Other Ambulatory Visit: Payer: Self-pay

## 2020-07-25 VITALS — BP 121/77 | HR 94 | Temp 98.8°F | Ht 64.0 in | Wt 236.4 lb

## 2020-07-25 DIAGNOSIS — M47816 Spondylosis without myelopathy or radiculopathy, lumbar region: Secondary | ICD-10-CM | POA: Insufficient documentation

## 2020-07-25 DIAGNOSIS — M170 Bilateral primary osteoarthritis of knee: Secondary | ICD-10-CM | POA: Insufficient documentation

## 2020-07-25 DIAGNOSIS — Z5181 Encounter for therapeutic drug level monitoring: Secondary | ICD-10-CM | POA: Insufficient documentation

## 2020-07-25 DIAGNOSIS — Z79891 Long term (current) use of opiate analgesic: Secondary | ICD-10-CM | POA: Diagnosis not present

## 2020-07-25 DIAGNOSIS — M2550 Pain in unspecified joint: Secondary | ICD-10-CM | POA: Diagnosis not present

## 2020-07-25 DIAGNOSIS — M7061 Trochanteric bursitis, right hip: Secondary | ICD-10-CM | POA: Insufficient documentation

## 2020-07-25 DIAGNOSIS — M7062 Trochanteric bursitis, left hip: Secondary | ICD-10-CM | POA: Insufficient documentation

## 2020-07-25 DIAGNOSIS — M5416 Radiculopathy, lumbar region: Secondary | ICD-10-CM | POA: Insufficient documentation

## 2020-07-25 DIAGNOSIS — G894 Chronic pain syndrome: Secondary | ICD-10-CM | POA: Insufficient documentation

## 2020-07-25 MED ORDER — MORPHINE SULFATE 15 MG PO TABS: ORAL_TABLET | ORAL | 0 refills | Status: DC

## 2020-07-25 MED ORDER — MORPHINE SULFATE ER 15 MG PO TBCR: 15.0000 mg | EXTENDED_RELEASE_TABLET | Freq: Two times a day (BID) | ORAL | 0 refills | Status: DC

## 2020-07-25 NOTE — Progress Notes (Signed)
Subjective:    Patient ID: [name], female    DOB: 01/22/1941, 80 y.o.   MRN: [id number]  HPI: [name] is a 80 y.o. female who returns for follow up appointment for chronic pain and medication refill. She states her pain is located in her lower back radiating into her bilateral lower extremities, bilateral hip pain L>R and bilateral knee pain R>L. Also reports generalized pain.She rates her pain 6. Her current exercise regime is walking and performing stretching exercises.  [name] Morphine equivalent is 60.00  MME.  Oral Swab was performed today.   Pain Inventory Average Pain 7 Pain Right Now 6 My pain is intermittent, sharp and burning  In the last 24 hours, has pain interfered with the following? General activity 6 Relation with others 6 Enjoyment of life 6 What TIME of day is your pain at its worst? morning  Sleep (in general) NA  Pain is worse with: walking, bending and standing Pain improves with: medication Relief from Meds: 6  Family History  Problem Relation Age of Onset  . Diabetes Mother   . Heart attack Mother   . Hypertension Father   . Lung cancer Father   . Diabetes Sister   . Diabetes Brother   . Hypertension Brother   . Heart disease Brother   . Heart attack Brother   . Kidney cancer Brother   . Uterine cancer Daughter   . Breast cancer Sister   . Rheum arthritis Maternal Uncle   . Gout Brother   . Kidney failure Brother        x 5   Social History   Socioeconomic History  . Marital status: Married    Spouse name: Not on file  . Number of children: 6  . Years of education: college  . Highest education level: Not on file  Occupational History  . Occupation: retired Marine scientist  Tobacco Use  . Smoking status: Never Smoker  . Smokeless tobacco: Never Used  Vaping Use  . Vaping Use: Never used  Substance and Sexual Activity  . Alcohol use: No  . Drug use: No  . Sexual activity: Not Currently  Other Topics Concern  . Not  on file  Social History Narrative   Patient consumes 2-3 cups coffee per day.   Social Determinants of Health   Financial Resource Strain: Not on file  Food Insecurity: Not on file  Transportation Needs: Not on file  Physical Activity: Not on file  Stress: Not on file  Social Connections: Not on file   Past Surgical History:  Procedure Laterality Date  . ABDOMINAL HYSTERECTOMY    . BRAIN SURGERY     Gamma knife 10/13. Needs repeat spring  '14  . CARDIAC CATHETERIZATION N/A 10/31/2014   Procedure: Right/Left Heart Cath and Coronary Angiography;  Surgeon: [name], MD;  Location: [facility];  Service: Cardiovascular;  Laterality: N/A;  . ESOPHAGOGASTRODUODENOSCOPY (EGD) WITH PROPOFOL N/A 09/14/2014   Procedure: ESOPHAGOGASTRODUODENOSCOPY (EGD) WITH PROPOFOL;  Surgeon: [name], MD;  Location: [facility];  Service: Endoscopy;  Laterality: N/A;  . INCISION AND DRAINAGE HIP Left 01/16/2017   Procedure: IRRIGATION AND DEBRIDEMENT LEFT HIP;  Surgeon: [name], MD;  Location: [facility];  Service: Orthopedics;  Laterality: Left;  . INTRAMEDULLARY (IM) NAIL INTERTROCHANTERIC Left 11/29/2016   Procedure: INTRAMEDULLARY (IM) NAIL INTERTROCHANTRIC;  Surgeon: [name], MD;  Location: [facility];  Service: Orthopedics;  Laterality: Left;  . OVARY SURGERY    .  SHOULDER SURGERY Left   . TONSILLECTOMY  age 52  . VIDEO BRONCHOSCOPY Bilateral 05/31/2013   Procedure: VIDEO BRONCHOSCOPY WITHOUT FLUORO;  Surgeon: Brand Males, MD;  Location: [facility];  Service: Cardiopulmonary;  Laterality: Bilateral;  . video bronscoscopy  2000   lung   Past Surgical History:  Procedure Laterality Date  . ABDOMINAL HYSTERECTOMY    . BRAIN SURGERY     Gamma knife 10/13. Needs repeat spring  '14  . CARDIAC CATHETERIZATION N/A 10/31/2014   Procedure: Right/Left Heart Cath and Coronary Angiography;  Surgeon: [name], MD;  Location: [facility];   Service: Cardiovascular;  Laterality: N/A;  . ESOPHAGOGASTRODUODENOSCOPY (EGD) WITH PROPOFOL N/A 09/14/2014   Procedure: ESOPHAGOGASTRODUODENOSCOPY (EGD) WITH PROPOFOL;  Surgeon: [name], MD;  [facility];  Service: Endoscopy;  Laterality: N/A;  . INCISION AND DRAINAGE HIP Left 01/16/2017   Procedure: IRRIGATION AND DEBRIDEMENT LEFT HIP;  Surgeon: [name], MD;  [facility];  Service: Orthopedics;  Laterality: Left;  . INTRAMEDULLARY (IM) NAIL INTERTROCHANTERIC Left 11/29/2016   Procedure: INTRAMEDULLARY (IM) NAIL INTERTROCHANTRIC;  Surgeon: [name], MD;  Location: [facility];  Service: Orthopedics;  Laterality: Left;  . OVARY SURGERY    . SHOULDER SURGERY Left   . TONSILLECTOMY  age 35  . VIDEO BRONCHOSCOPY Bilateral 05/31/2013   Procedure: VIDEO BRONCHOSCOPY WITHOUT FLUORO;  Surgeon: Brand Males, MD;  Location: [facility];  Service: Cardiopulmonary;  Laterality: Bilateral;  . video bronscoscopy  2000   lung   Past Medical History:  Diagnosis Date  . Acute on chronic diastolic (congestive) heart failure ([place])   . Anemia    iron deficiency anemia - secondary to blood loss ( chronic)   . Arthritis    endstage changes bilateral knees/bilateral ankles.   . Asthma   . Carotid artery occlusion   . Chronic fatigue   . Chronic kidney disease   . Closed left hip fracture ([place])   . Clotting disorder ([place])    pt denies this  . Contusion of left knee    due to fall 1/14.  [name] COPD (chronic obstructive pulmonary disease) (HCC)    pulmonary fibrosis  . Depression, reactive   . Diabetes mellitus    type II   . Diastolic dysfunction   . Difficulty in walking   . Family history of heart disease   . Generalized muscle weakness   . Gout   . High cholesterol   . History of falling   . Hypertension   . Hypothyroidism   . Interstitial lung disease ([place])   . Meningioma of left sphenoid wing involving cavernous sinus (HCC) 02/17/2012    Continue diplopia, left eye pain and left headaches.    . Morbid obesity ([place])   . Morbid obesity ([place])   . Neuromuscular disorder ([place])    diabetic neuropathy   . Normal coronary arteries    cardiac catheterization performed  10/31/14  . RA (rheumatoid arthritis) ([place])    has been off methotreaxte since 10/13.  [name] Rheumatoid arthritis ([place])   . Shortness of breath   . Spinal stenosis of lumbar region   . Thyroid disease   . Unspecified lack of coordination   . URI (upper respiratory infection)    BP 121/77   Pulse 94   Temp 98.8 F (37.1 C)   Ht _0  (1.626 m)   Wt 236 lb 6.4 oz (107.2 kg)   SpO2 (!) 89% Comment: wears o2 at  home  BMI 40.58 kg/m   Opioid Risk Score:   Fall Risk Score:  `1  Depression screen PHQ 2/9  Depression screen [facility] 2/9 12/14/2019 09/22/2019 06/11/2018 05/26/2018 12/09/2017 10/22/2017  Decreased Interest _0 0  Down, Depressed, Hopeless _1 0  PHQ - 2 Score _2 0  Altered sleeping - _3 - -  Tired, decreased energy - _4 - -  Change in appetite - _5 - -  Feeling bad or failure about yourself  - _6 - -  Trouble concentrating - 2 2 0 - -  Moving slowly or fidgety/restless - 1 0 2 - -  Suicidal thoughts - 0 0 0 - -  PHQ-9 Score - _7 - -  Difficult doing work/chores - Somewhat difficult - Somewhat difficult - -  Some recent data might be hidden    Review of Systems  Constitutional: Negative.   HENT: Negative.   Eyes: Negative.   Respiratory:       Wears o2 at home  Cardiovascular: Negative.   Gastrointestinal: Negative.   Endocrine: Negative.   Genitourinary: Negative.   Musculoskeletal: Positive for back pain.       Left shoulder and hands  Skin: Negative.   Allergic/Immunologic: Negative.   Neurological: Negative.   Hematological: Negative.   Psychiatric/Behavioral: Negative.   All other systems reviewed and are negative.      Objective:   Physical Exam Vitals and nursing note reviewed.   Constitutional:      Appearance: Normal appearance.  Neck:     Comments: Cervical Paraspinal Tenderness: C-5-C-6 Cardiovascular:     Rate and Rhythm: Normal rate and regular rhythm.     Pulses: Normal pulses.     Heart sounds: Normal heart sounds.  Pulmonary:     Effort: Pulmonary effort is normal.     Breath sounds: Normal breath sounds.  Musculoskeletal:     Cervical back: Normal range of motion and neck supple.     Comments: Normal Muscle Bulk and Muscle Testing Reveals:  Upper Extremities:Decreased ROM 90 Degrees and Muscle Strength 4/5  Thoracic and  Lumbar Hypersensitivity Bilateral Greater Trochanter Tenderness Lower Extremities: Decreased ROM and Muscle Strength 4/5 Arrived in wheelchair   Skin:    General: Skin is warm and dry.  Neurological:     Mental Status: She is alert and oriented to person, place, and time.  Psychiatric:        Mood and Affect: Mood normal.        Behavior: Behavior normal.           Assessment & Plan:  1. Lumbar spinal stenosis with neurogenic claudication. Associated facet arthropathy/ Lumbar Radiculitis: Continuecurrent medication regime withGabapentinand HEP as tolerated.07/25/2020 Refilled:MS Contin 15 mg one tablet every 12 hours #60andMSIR 15 one tablet twice a dayas neededFor break through pain( 6 am and 1:00 pm)#60.since adverse effect with insomnia with Nucynta. Nucynta discontinued due to insomnia. We will continue the opioid monitoring program, this consists of regular clinic visits, examinations, urine drug screen, pill counts as well as use of [place] Controlled Substance Reporting system. A 12 month History has been reviewed on the [place] Controlled Substance Reporting System03/01/2021. 2. Chronic Bilateral Thoracic Back Pain: Continue HEP as Tolerated. Continue to Monitor. 07/25/2020 3 Depression: Continuecurrent medication Regime:Cymbalta.Continue to monitor.07/25/2020 4. Diabetes mellitus  type 2 with polyneuropathy: Continuecurrent medication regime:Gabapentin.07/25/2020 5. Rheumatoid arthritis  and osteoarthritis. Continuecurrent medication Regime.Voltaren Gel. Rheumatology Following.07/25/2020 6. Interstitial lung disease: Pulmonology Following.07/25/2020. 7. Bilateral Osteoarthritis Knee's: Continuecurrent medication regimen.Voltaren Gel.07/25/2020. 8. Muscle Spasm: Continuecurrent medication regime: Flexeril.Continue to monitor.07/25/2020 9.BilateralGreater Trochanteric Tenderness:L>R.Continuecurrent treatmentwith Ice/Heat Therapy.07/25/2020. 10. Polyarthralgia: Rheumatology Following: Continue to monitor.07/25/2020 11. Morbid Obesitity: Continue Healthy Diet Regime and HEP. Continue to monitor.07/25/2020 12. Right Shoulder tendonitis: No complaints today..Continue to Alternate with Heat and Ice Therapy. Continue to monitor.07/25/2020   F/U in65month

## 2020-07-29 LAB — DRUG TOX MONITOR 1 W/CONF, ORAL FLD

## 2020-07-29 LAB — DRUG TOX ALC METAB W/CON, ORAL FLD: Alcohol Metabolite: NEGATIVE ng/mL (ref <25)

## 2020-07-30 ENCOUNTER — Ambulatory Visit (INDEPENDENT_AMBULATORY_CARE_PROVIDER_SITE_OTHER): Payer: Medicare Other | Admitting: Adult Health

## 2020-07-30 ENCOUNTER — Other Ambulatory Visit: Payer: Self-pay

## 2020-07-30 ENCOUNTER — Encounter: Payer: Self-pay | Admitting: Internal Medicine

## 2020-07-30 ENCOUNTER — Encounter: Payer: Self-pay | Admitting: Adult Health

## 2020-07-30 ENCOUNTER — Telehealth: Payer: Self-pay | Admitting: Adult Health

## 2020-07-30 VITALS — BP 146/90 | HR 100 | Temp 97.4°F | Ht 64.0 in | Wt 233.4 lb

## 2020-07-30 DIAGNOSIS — J8410 Pulmonary fibrosis, unspecified: Secondary | ICD-10-CM | POA: Diagnosis not present

## 2020-07-30 DIAGNOSIS — J849 Interstitial pulmonary disease, unspecified: Secondary | ICD-10-CM

## 2020-07-30 DIAGNOSIS — J479 Bronchiectasis, uncomplicated: Secondary | ICD-10-CM

## 2020-07-30 DIAGNOSIS — J9611 Chronic respiratory failure with hypoxia: Secondary | ICD-10-CM

## 2020-07-30 NOTE — Assessment & Plan Note (Signed)
Bronchiectasis appears to be stable.  Patient is encouraged on mucociliary clearance regimen.  Continue with flutter valve/Acapella device 3 times daily.  Patient is doing well on azithromycin 3 days a week.  And Duoneb .   Plan  Patient Instructions  Begin Oxygen 2l/m with activity. Order for POC to DME .  Continue on Oxygen 2l/m At bedtime   Continue with Flutter valve At bedtime  .  Activity as tolerated.  Continue on follow up with [facility]  Follow up with [name] in 6 months and As needed

## 2020-07-30 NOTE — Telephone Encounter (Signed)
I have called and LM on VM for the pt to call us back.

## 2020-07-30 NOTE — Addendum Note (Signed)
Addended by: Vanessa Barbara on: 07/30/2020 02:46 PM   Modules accepted: Orders

## 2020-07-30 NOTE — Assessment & Plan Note (Signed)
Chronic respiratory failure continue on oxygen 2 L at bedtime.  Patient says she has intermittent episodes of exertional hypoxemia.  Walk test in the office does show some mild desaturations around 87% on room air.  Will begin oxygen 2 L with activity-this will help her with more independence and activity levels.  Order for POC sent to [facility]  Patient Instructions  Begin Oxygen 2l/m with activity. Order for POC to DME .  Continue on Oxygen 2l/m At bedtime   Continue with Flutter valve At bedtime  .  Activity as tolerated.  Continue on follow up with [facility]  Follow up with [name] in 6 months and As needed

## 2020-07-30 NOTE — Telephone Encounter (Signed)
Spoke with the pt  She states Tammy wanted to know the name of med she takes per rheum  She is taking leflunomide 10 mg 2 tablets daily  I have added this to [facility]  Will close this encounter out and forward to [place] as [employer]

## 2020-07-30 NOTE — Assessment & Plan Note (Signed)
Has been stable on previous scans.  Check chest x-ray on return visit

## 2020-07-30 NOTE — Assessment & Plan Note (Signed)
Continue follow-up with rheumatology.  Continue on current regimen

## 2020-07-30 NOTE — Progress Notes (Signed)
_0  ID: [name], female    DOB: [birth year], 80 y.o.   MRN: [id number]  Chief Complaint  Patient presents with  . Follow-up    Referring provider: [name], MD  HPI: 80 year old female followed for interstitial lung disease with bronchiectasis in the setting of rheumatoid arthritis.  Followed at [facility] with rheumatology and pulmonary History of chronic respiratory failure on oxygen 2 L at bedtime Previous history of DVT in January 2020 Chronic back pain on chronic pain meds.    TEST/EVENTS : High-resolution CT chest November 24, 2018 mid to lower lung bronchiectasis, groundglass reticular densities stable since 2016  07/30/2020  Patient returns for a 1 year follow-up.  She is followed at [facility] pulmonary for her bronchiectasis and pulmonary fibrosis.  She is on flutter valve, nebulizer therapy.  And 3 days a week azithromycin.  Says overall she has not had a flare of cough or congestion.  No recent antibiotics or hospitalizations for her breathing. Patient does get short of breath with minimum activity.  She is sedentary.  Uses a walker at home.  Does chair exercises but is very limited by her joint pain and breathing issues.  But says overall over the last year she has been better than her usual.  Has rheumatoid arthritis followed by [facility]ology -on chronic steroids prednisone 10 mg daily, Leflunomide , and Plaquenil.  Has chronic pain on chronic narcotics.  Remains on Oxygen 2l/m At bedtime  . Says oxygen does drop with walking but does not have a portable system. Walk test in office shows oxygen level at 87% walking on room air.  Required 2 L of oxygen to maintain sats greater than 88 to 90%.  Patient is followed by cardiology for diastolic heart failure and chronic edema.  She is on Lasix and metolazone.  She says her leg swelling has been doing better but has daily leg swelling always.   Allergies  Allergen Reactions  . Codeine Swelling and  Other (See Comments)    Facial swelling Chest pain and swelling in legs  Facial swelling  . Infliximab Anaphylaxis    "sent me into shock" "sent me into shock"  . Lisinopril Swelling    Face and neck swelling Face and neck swelling    Immunization History  Administered Date(s) Administered  . Fluad Quad(high Dose 65+) 02/02/2019  . Influenza Split 01/29/2012, 03/30/2013, 02/10/2014  . Influenza, High Dose Seasonal PF 02/16/2017, 02/11/2018, 01/31/2019, 02/27/2020  . Influenza,inj,Quad PF,6+ Mos 02/09/2015, 12/18/2015  . Influenza-Unspecified 02/09/2015, 12/18/2015  . PFIZER(Purple Top)SARS-COV-2 Vaccination 07/17/2019, 08/14/2019, 02/27/2020  . PPD Test 04/23/2015  . Pneumococcal Conjugate-13 08/21/2014  . Pneumococcal Polysaccharide-23 02/17/2012  . Tdap 12/27/2015    Past Medical History:  Diagnosis Date  . Acute on chronic diastolic (congestive) heart failure ([place])   . Anemia    iron deficiency anemia - secondary to blood loss ( chronic)   . Arthritis    endstage changes bilateral knees/bilateral ankles.   . Asthma   . Carotid artery occlusion   . Chronic fatigue   . Chronic kidney disease   . Closed left hip fracture ([place])   . Clotting disorder ([place])    pt denies this  . Contusion of left knee    due to fall 1/14.  [name] COPD (chronic obstructive pulmonary disease) (HCC)    pulmonary fibrosis  . Depression, reactive   . Diabetes mellitus    type II   . Diastolic dysfunction   .  Difficulty in walking   . Family history of heart disease   . Generalized muscle weakness   . Gout   . High cholesterol   . History of falling   . Hypertension   . Hypothyroidism   . Interstitial lung disease ([place])   . 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    . Morbid obesity ([place])   . Morbid obesity ([place])   . Neuromuscular disorder ([place])    diabetic neuropathy   . Normal coronary arteries     cardiac catheterization performed  10/31/14  . RA (rheumatoid arthritis) ([place])    has been off methotreaxte since 10/13.  [name] Rheumatoid arthritis ([place])   . Shortness of breath   . Spinal stenosis of lumbar region   . Thyroid disease   . Unspecified lack of coordination   . URI (upper respiratory infection)     Tobacco History: Social History   Tobacco Use  Smoking Status Never Smoker  Smokeless Tobacco Never Used   Counseling given: Not Answered   Outpatient Medications Prior to Visit  Medication Sig Dispense Refill  . acetaminophen (TYLENOL) 500 MG tablet Take 1,000 mg by mouth every 6 (six) hours as needed for moderate pain.     [name] albuterol (PROVENTIL HFA;VENTOLIN HFA) 108 (90 BASE) MCG/ACT inhaler Inhale 2 puffs into the lungs every 6 (six) hours as needed for wheezing or shortness of breath.    . allopurinol (ZYLOPRIM) 300 MG tablet Take 300 mg by mouth daily.    [name] aspirin buffered (BUFFERIN) 325 MG TABS tablet Take 325 mg by mouth daily.    [name] atorvastatin (LIPITOR) 20 MG tablet Take 0.5 tablets (10 mg total) by mouth every evening. 90 tablet 0  . azithromycin (ZITHROMAX) 250 MG tablet Take 250 mg by mouth 3 (three) times a week. Take 3 times weekly Mon,Wed, Friday / pt reported taking    . Calcium Carb-Cholecalciferol (CALCIUM + D3 PO) Take 2 tablets by mouth daily with lunch.     . cholecalciferol (VITAMIN D) 1000 UNITS tablet Take 2,000 Units by mouth daily with lunch.     . colchicine 0.6 MG tablet Take 0.6 mg by mouth daily.    . cyclobenzaprine (FLEXERIL) 5 MG tablet Take 1 tablet (5 mg total) by mouth at bedtime as needed for muscle spasms. 90 tablet 3  . diclofenac sodium (VOLTAREN) 1 % GEL Apply 2 g topically 3 (three) times daily. Both hands and knees 300 g 3  . DULoxetine (CYMBALTA) 60 MG capsule Take 1 capsule (60 mg total) by mouth daily. 10 capsule 0  . fluticasone (FLONASE) 50 MCG/ACT nasal spray Place 2 sprays into both nostrils daily. 16 g 3  . furosemide  (LASIX) 80 MG tablet Take 80 mg by mouth.    . gabapentin (NEURONTIN) 600 MG tablet Take two in the morning , Two mid- afternoon and two at bedtime 540 tablet 3  . guaiFENesin (MUCINEX) 600 MG 12 hr tablet Take 1,200 mg by mouth 2 (two) times daily.    . hydroxychloroquine (PLAQUENIL) 200 MG tablet Take 400 mg by mouth daily.     [name] ipratropium-albuterol (DUONEB) 0.5-2.5 (3) MG/3ML SOLN Take 3 mLs by nebulization every 6 (six) hours as needed (cough/wheezing).     [name] levothyroxine (SYNTHROID) 50 MCG tablet Take 1 tablet (50 mcg total) by mouth daily. 90 tablet 1  . linaclotide (LINZESS) 72 MCG capsule Take 1  capsule (72 mcg total) by mouth daily before breakfast. 90 capsule 1  . metolazone (ZAROXOLYN) 2.5 MG tablet TAKE 1 TABLET BY MOUTH DAILY AS NEEDED FOR 2 DAYS FOR WEIGHT ABOVE 255 POUNDS 90 tablet 1  . metoprolol succinate (TOPROL XL) 25 MG 24 hr tablet Take 3 tablets (75 mg total) by mouth daily. 270 tablet 3  . morphine (MS CONTIN) 15 MG 12 hr tablet Take 1 tablet (15 mg total) by mouth every 12 (twelve) hours. 60 tablet 0  . morphine (MSIR) 15 MG tablet One tablet twice a day as needed for break through pain. 60 tablet 0  . Multiple Vitamin (MULTIVITAMIN WITH MINERALS) TABS Take 1 tablet by mouth daily.     [name] olmesartan (BENICAR) 40 MG tablet Take 1 tablet (40 mg total) by mouth daily. 90 tablet 1  . OXYGEN-HELIUM IN Inhale 2 L into the lungs at bedtime. And on exertion    . pantoprazole (PROTONIX) 40 MG tablet take 1 tablet by mouth once daily MINUTES BEFORE 1ST MEAL OF THE DAY 90 tablet 1  . potassium chloride SA (KLOR-CON) 20 MEQ tablet TAKE 1 TABLET(20 MEQ) BY MOUTH DAILY 90 tablet 0  . predniSONE (DELTASONE) 5 MG tablet Take 10 mg by mouth every morning.   0  . Semaglutide,0.25 or 0.5MG/DOS, (OZEMPIC, 0.25 OR 0.5 MG/DOSE,) 2 MG/1.5ML SOPN Inject 0.5 mg into the skin once a week. 3 mL 2  . sodium chloride (OCEAN) 0.65 % SOLN nasal spray Place 1 spray into both nostrils as needed for  congestion.    [name] Spacer/Aero-Holding Chambers (AEROCHAMBER MV) inhaler Use as instructed 1 each 0  . azithromycin (ZITHROMAX) 250 MG tablet Take 2 tabs on day one.  Then take 1 tab daily on days 2-7. (Patient not taking: Reported on 07/30/2020) 8 tablet 0  . azithromycin (ZITHROMAX) 250 MG tablet Take by mouth. (Patient not taking: Reported on 07/30/2020)     No facility-administered medications prior to visit.     Review of Systems:   Constitutional:   No  weight loss, night sweats,  Fevers, chills,  +fatigue, or  lassitude.  HEENT:   No headaches,  Difficulty swallowing,  Tooth/dental problems, or  Sore throat,                No sneezing, itching, ear ache, nasal congestion, post nasal drip,   CV:  No chest pain,  Orthopnea, PND,  anasarca, dizziness, palpitations, syncope.   GI  No heartburn, indigestion, abdominal pain, nausea, vomiting, diarrhea, change in bowel habits, loss of appetite, bloody stools.   Resp:   No excess mucus, no productive cough,  No non-productive cough,  No coughing up of blood.  No change in color of mucus.  No wheezing.  No chest wall deformity  Skin: no rash or lesions.  GU: no dysuria, change in color of urine, no urgency or frequency.  No flank pain, no hematuria   MS:  No joint pain or swelling.  No decreased range of motion.  No back pain.    Physical Exam  BP (!) 146/90 (BP Location: Right Arm, Patient Position: Sitting, Cuff Size: Large)   Pulse 100   Temp (!) 97.4 F (36.3 C) (Temporal)   Ht _0  (1.626 m)   Wt 233 lb 6.4 oz (105.9 kg)   SpO2 94%   BMI 40.06 kg/m   GEN: A/Ox3; pleasant , NAD, well nourished, BMI 40, wheelchair   HEENT:  [place]/AT,    NOSE-clear, THROAT-clear,  no lesions, no postnasal drip or exudate noted.   NECK:  Supple w/ fair ROM; no JVD; normal carotid impulses w/o bruits; no thyromegaly or nodules palpated; no lymphadenopathy.    RESP  Clear  P & A; w/o, wheezes/ rales/ or rhonchi. no accessory muscle use, no  dullness to percussion  CARD:  RRR, no m/r/g, 2+ peripheral edema, pulses intact, no cyanosis or clubbing.  GI:   Soft & nt; nml bowel sounds; no organomegaly or masses detected.   Musco: Warm bil, no deformities or joint swelling noted.   Neuro: alert, no focal deficits noted.    Skin: Warm, no lesions or rashes    Lab Results:   BNP  Imaging: No results found.    PFT Results Latest Ref Rng & Units 01/12/2014 09/12/2013 04/28/2013  FVC-Pre L 1.59 1.78 1.73  FVC-Predicted Pre % 76 77 74  FVC-Post L 1.46 1.54 1.19  FVC-Predicted Post % 70 66 51  Pre FEV1/FVC % % 91 91 84  Post FEV1/FCV % % 93 89 97  FEV1-Pre L 1.45 1.63 1.46  FEV1-Predicted Pre % 90 90 81  FEV1-Post L 1.36 1.36 1.15  DLCO uncorrected ml/min/mmHg 16.69 12.50 -  DLCO UNC% % 77 51 -  DLVA Predicted % 95 91 -  TLC L 3.55 3.57 2.94  TLC % Predicted % 74 70 58  RV % Predicted % 62 66 62    No results found for: NITRICOXIDE      Assessment & Plan:   Bronchiectasis without complication (HCC) Bronchiectasis appears to be stable.  Patient is encouraged on mucociliary clearance regimen.  Continue with flutter valve/Acapella device 3 times daily.  Patient is doing well on azithromycin 3 days a week.  And Duoneb .   Plan  Patient Instructions  Begin Oxygen 2l/m with activity. Order for POC to DME .  Continue on Oxygen 2l/m At bedtime   Continue with Flutter valve At bedtime  .  Activity as tolerated.  Continue on follow up with [facility]  Follow up with [name] in 6 months and As needed         Pulmonary fibrosis, postinflammatory ([place]) Has been stable on previous scans.  Check chest x-ray on return visit  Chronic respiratory failure with hypoxia (HCC) Chronic respiratory failure continue on oxygen 2 L at bedtime.  Patient says she has intermittent episodes of exertional hypoxemia.  Walk test in the office does show some mild desaturations around 87% on room air.  Will  begin oxygen 2 L with activity-this will help her with more independence and activity levels.  Order for POC sent to the [facility]  Patient Instructions  Begin Oxygen 2l/m with activity. Order for POC to DME .  Continue on Oxygen 2l/m At bedtime   Continue with Flutter valve At bedtime  .  Activity as tolerated.  Continue on follow up with [facility]  Follow up with [name] in 6 months and As needed        Rheumatoid arthritis [facility]) Continue follow-up with rheumatology.  Continue on current regimen     Rexene Edison, NP 07/30/2020

## 2020-07-30 NOTE — Patient Instructions (Signed)
Begin Oxygen 2l/m with activity. Order for POC to DME .  Continue on Oxygen 2l/m At bedtime   Continue with Flutter valve At bedtime  .  Activity as tolerated.  Continue on follow up with [facility]  Follow up with [name] in 6 months and As needed

## 2020-08-01 DIAGNOSIS — N2581 Secondary hyperparathyroidism of renal origin: Secondary | ICD-10-CM | POA: Diagnosis not present

## 2020-08-01 DIAGNOSIS — I129 Hypertensive chronic kidney disease with stage 1 through stage 4 chronic kidney disease, or unspecified chronic kidney disease: Secondary | ICD-10-CM | POA: Diagnosis not present

## 2020-08-01 DIAGNOSIS — E1122 Type 2 diabetes mellitus with diabetic chronic kidney disease: Secondary | ICD-10-CM | POA: Diagnosis not present

## 2020-08-01 DIAGNOSIS — N183 Chronic kidney disease, stage 3 unspecified: Secondary | ICD-10-CM | POA: Diagnosis not present

## 2020-08-01 LAB — BASIC METABOLIC PANEL
BUN: 23 — AB (ref 4–21)
CO2: 35 — AB (ref 13–22)
Chloride: 106 (ref 99–108)
Creatinine: 1 (ref 0.5–1.1)
Glucose: 106
Potassium: 3.8 (ref 3.4–5.3)
Sodium: 149 — AB (ref 137–147)

## 2020-08-01 LAB — COMPREHENSIVE METABOLIC PANEL
Albumin: 3.7 (ref 3.5–5.0)
Calcium: 8.7 (ref 8.7–10.7)

## 2020-08-02 ENCOUNTER — Telehealth: Payer: Self-pay | Admitting: Adult Health

## 2020-08-02 ENCOUNTER — Telehealth: Payer: Self-pay | Admitting: *Deleted

## 2020-08-02 NOTE — Telephone Encounter (Signed)
Got confirmation back from [place] at [facility] they have the order called pt to make her aware

## 2020-08-02 NOTE — Telephone Encounter (Signed)
Checked pt's chart and saw that one order was placed after OV with TP 3/14. Then another order was placed and that second order was discontinued but still see where the first one was placed.   Called and spoke with pt letting her know that we did place order after OV and stated to her that we would check with [facility] for follow up. Pt verbalized understanding. PCCs, please advise.

## 2020-08-02 NOTE — Telephone Encounter (Signed)
Oral swab drug screen was consistent for prescribed medications.

## 2020-08-03 DIAGNOSIS — G5621 Lesion of ulnar nerve, right upper limb: Secondary | ICD-10-CM | POA: Diagnosis not present

## 2020-08-03 DIAGNOSIS — M1A00X0 Idiopathic chronic gout, unspecified site, without tophus (tophi): Secondary | ICD-10-CM | POA: Diagnosis not present

## 2020-08-03 DIAGNOSIS — J479 Bronchiectasis, uncomplicated: Secondary | ICD-10-CM | POA: Diagnosis not present

## 2020-08-03 DIAGNOSIS — Z79899 Other long term (current) drug therapy: Secondary | ICD-10-CM | POA: Diagnosis not present

## 2020-08-03 DIAGNOSIS — M0579 Rheumatoid arthritis with rheumatoid factor of multiple sites without organ or systems involvement: Secondary | ICD-10-CM | POA: Diagnosis not present

## 2020-08-07 ENCOUNTER — Encounter: Payer: Self-pay | Admitting: Family Medicine

## 2020-09-12 ENCOUNTER — Telehealth: Payer: Self-pay | Admitting: Nurse Practitioner

## 2020-09-12 NOTE — Telephone Encounter (Signed)
Spoke with patient to see if we could reschedule her Palliative f/u visit on 09/13/20 and she said that she had tried to reach the NP yesterday to reschedule anyway.  F/u visit was rescheduled for 10/10/20 @ 12:30 PM as a Telephone f/u visit.

## 2020-09-13 ENCOUNTER — Other Ambulatory Visit: Payer: Medicare Other | Admitting: Nurse Practitioner

## 2020-09-13 DIAGNOSIS — H2513 Age-related nuclear cataract, bilateral: Secondary | ICD-10-CM | POA: Diagnosis not present

## 2020-09-13 DIAGNOSIS — E119 Type 2 diabetes mellitus without complications: Secondary | ICD-10-CM | POA: Diagnosis not present

## 2020-09-13 DIAGNOSIS — H524 Presbyopia: Secondary | ICD-10-CM | POA: Diagnosis not present

## 2020-09-13 DIAGNOSIS — H02824 Cysts of left upper eyelid: Secondary | ICD-10-CM | POA: Diagnosis not present

## 2020-09-13 DIAGNOSIS — L723 Sebaceous cyst: Secondary | ICD-10-CM | POA: Diagnosis not present

## 2020-09-24 ENCOUNTER — Other Ambulatory Visit: Payer: Self-pay

## 2020-09-24 ENCOUNTER — Encounter: Payer: Self-pay | Admitting: Registered Nurse

## 2020-09-24 ENCOUNTER — Encounter: Payer: Medicare Other | Attending: Physical Medicine & Rehabilitation | Admitting: Registered Nurse

## 2020-09-24 VITALS — BP 137/84 | HR 103 | Temp 98.7°F | Ht 64.0 in | Wt 236.0 lb

## 2020-09-24 DIAGNOSIS — M5416 Radiculopathy, lumbar region: Secondary | ICD-10-CM | POA: Diagnosis not present

## 2020-09-24 DIAGNOSIS — Y92009 Unspecified place in unspecified non-institutional (private) residence as the place of occurrence of the external cause: Secondary | ICD-10-CM | POA: Insufficient documentation

## 2020-09-24 DIAGNOSIS — Z79891 Long term (current) use of opiate analgesic: Secondary | ICD-10-CM | POA: Diagnosis not present

## 2020-09-24 DIAGNOSIS — M7591 Shoulder lesion, unspecified, right shoulder: Secondary | ICD-10-CM | POA: Diagnosis not present

## 2020-09-24 DIAGNOSIS — M47816 Spondylosis without myelopathy or radiculopathy, lumbar region: Secondary | ICD-10-CM | POA: Diagnosis not present

## 2020-09-24 DIAGNOSIS — M25552 Pain in left hip: Secondary | ICD-10-CM | POA: Diagnosis not present

## 2020-09-24 DIAGNOSIS — M48062 Spinal stenosis, lumbar region with neurogenic claudication: Secondary | ICD-10-CM | POA: Diagnosis not present

## 2020-09-24 DIAGNOSIS — J849 Interstitial pulmonary disease, unspecified: Secondary | ICD-10-CM | POA: Insufficient documentation

## 2020-09-24 DIAGNOSIS — M62838 Other muscle spasm: Secondary | ICD-10-CM | POA: Diagnosis not present

## 2020-09-24 DIAGNOSIS — G4700 Insomnia, unspecified: Secondary | ICD-10-CM | POA: Insufficient documentation

## 2020-09-24 DIAGNOSIS — Z760 Encounter for issue of repeat prescription: Secondary | ICD-10-CM | POA: Insufficient documentation

## 2020-09-24 DIAGNOSIS — M069 Rheumatoid arthritis, unspecified: Secondary | ICD-10-CM | POA: Insufficient documentation

## 2020-09-24 DIAGNOSIS — M47819 Spondylosis without myelopathy or radiculopathy, site unspecified: Secondary | ICD-10-CM | POA: Diagnosis not present

## 2020-09-24 DIAGNOSIS — M542 Cervicalgia: Secondary | ICD-10-CM | POA: Diagnosis not present

## 2020-09-24 DIAGNOSIS — E1142 Type 2 diabetes mellitus with diabetic polyneuropathy: Secondary | ICD-10-CM | POA: Insufficient documentation

## 2020-09-24 DIAGNOSIS — G8929 Other chronic pain: Secondary | ICD-10-CM

## 2020-09-24 DIAGNOSIS — M5412 Radiculopathy, cervical region: Secondary | ICD-10-CM

## 2020-09-24 DIAGNOSIS — Z5181 Encounter for therapeutic drug level monitoring: Secondary | ICD-10-CM | POA: Insufficient documentation

## 2020-09-24 DIAGNOSIS — M25512 Pain in left shoulder: Secondary | ICD-10-CM | POA: Insufficient documentation

## 2020-09-24 DIAGNOSIS — Z79899 Other long term (current) drug therapy: Secondary | ICD-10-CM | POA: Insufficient documentation

## 2020-09-24 DIAGNOSIS — G894 Chronic pain syndrome: Secondary | ICD-10-CM | POA: Insufficient documentation

## 2020-09-24 DIAGNOSIS — M25551 Pain in right hip: Secondary | ICD-10-CM | POA: Diagnosis not present

## 2020-09-24 DIAGNOSIS — Z8261 Family history of arthritis: Secondary | ICD-10-CM | POA: Diagnosis not present

## 2020-09-24 DIAGNOSIS — M7061 Trochanteric bursitis, right hip: Secondary | ICD-10-CM | POA: Insufficient documentation

## 2020-09-24 DIAGNOSIS — F32A Depression, unspecified: Secondary | ICD-10-CM | POA: Diagnosis not present

## 2020-09-24 DIAGNOSIS — M546 Pain in thoracic spine: Secondary | ICD-10-CM | POA: Insufficient documentation

## 2020-09-24 DIAGNOSIS — W19XXXD Unspecified fall, subsequent encounter: Secondary | ICD-10-CM | POA: Insufficient documentation

## 2020-09-24 DIAGNOSIS — M170 Bilateral primary osteoarthritis of knee: Secondary | ICD-10-CM | POA: Insufficient documentation

## 2020-09-24 DIAGNOSIS — M2550 Pain in unspecified joint: Secondary | ICD-10-CM

## 2020-09-24 DIAGNOSIS — M7062 Trochanteric bursitis, left hip: Secondary | ICD-10-CM | POA: Insufficient documentation

## 2020-09-24 MED ORDER — MORPHINE SULFATE 15 MG PO TABS: ORAL_TABLET | ORAL | 0 refills | Status: DC

## 2020-09-24 MED ORDER — MORPHINE SULFATE ER 15 MG PO TBCR: 15.0000 mg | EXTENDED_RELEASE_TABLET | Freq: Two times a day (BID) | ORAL | 0 refills | Status: DC

## 2020-09-24 NOTE — Progress Notes (Signed)
Subjective:    Patient ID: [name], female    DOB: 1940/05/28, 80 y.o.   MRN: [id number]  HPI: [name] is a 80 y.o. female who returns for follow up appointment for chronic pain and medication refill. She states her pain is located in her right hand with tingling, left shoulder,and mid- to lower back pain radiating into her left lower extremity. Also reports bilateral hip pain L>R and generalized pain all over.She  rates her pain 5. Her current exercise regime is walking and performing stretching exercises.  Ms. Vessel reports she fell off her rolling computer chair on 08/04/2020. She states she was leaning forward trying to pick something up off the floor and slid out of her chair forward. Her sone helped her up and she didn't seek medical attention. She was advised not to use the rolling chair anymore, she and her her care aide, verbalizes understanding. Educated on falls prevention, she verbalizes understanding.   [name] Morphine equivalent is 60.00 MME.  Last Oral Swab was Performed on 07/25/2020, it was consistent.      Pain Inventory Average Pain 7 Pain Right Now 5 My pain is intermittent, burning, dull and stabbing  In the last 24 hours, has pain interfered with the following? General activity 7 Relation with others 7 Enjoyment of life 7 What TIME of day is your pain at its worst? morning  Sleep (in general) Fair  Pain is worse with: walking, bending and some activites Pain improves with: heat/ice, pacing activities and medication Relief from Meds: 6  Family History  Problem Relation Age of Onset  . Diabetes Mother   . Heart attack Mother   . Hypertension Father   . Lung cancer Father   . Diabetes Sister   . Diabetes Brother   . Hypertension Brother   . Heart disease Brother   . Heart attack Brother   . Kidney cancer Brother   . Uterine cancer Daughter   . Breast cancer Sister   . Rheum arthritis Maternal Uncle   . Gout Brother   .  Kidney failure Brother        x 5   Social History   Socioeconomic History  . Marital status: Married    Spouse name: Not on file  . Number of children: 6  . Years of education: college  . Highest education level: Not on file  Occupational History  . Occupation: retired Marine scientist  Tobacco Use  . Smoking status: Never Smoker  . Smokeless tobacco: Never Used  Vaping Use  . Vaping Use: Never used  Substance and Sexual Activity  . Alcohol use: No  . Drug use: No  . Sexual activity: Not Currently  Other Topics Concern  . Not on file  Social History Narrative   Patient consumes 2-3 cups coffee per day.   Social Determinants of Health   Financial Resource Strain: Not on file  Food Insecurity: Not on file  Transportation Needs: Not on file  Physical Activity: Not on file  Stress: Not on file  Social Connections: Not on file   Past Surgical History:  Procedure Laterality Date  . ABDOMINAL HYSTERECTOMY    . BRAIN SURGERY     Gamma knife 10/13. Needs repeat spring  '14  . CARDIAC CATHETERIZATION N/A 10/31/2014   Procedure: Right/Left Heart Cath and Coronary Angiography;  Surgeon: [name], MD;  Location: [place] [facility];  Service: Cardiovascular;  Laterality: N/A;  . ESOPHAGOGASTRODUODENOSCOPY (EGD) WITH PROPOFOL  N/A 09/14/2014   Procedure: ESOPHAGOGASTRODUODENOSCOPY (EGD) WITH PROPOFOL;  Surgeon: [name], MD;  Location: [facility];  Service: Endoscopy;  Laterality: N/A;  . INCISION AND DRAINAGE HIP Left 01/16/2017   Procedure: IRRIGATION AND DEBRIDEMENT LEFT HIP;  Surgeon: [name], MD;  Location: [facility];  Service: Orthopedics;  Laterality: Left;  . INTRAMEDULLARY (IM) NAIL INTERTROCHANTERIC Left 11/29/2016   Procedure: INTRAMEDULLARY (IM) NAIL INTERTROCHANTRIC;  Surgeon: [name], MD;  Location: [place];  Service: Orthopedics;  Laterality: Left;  . OVARY SURGERY    . SHOULDER SURGERY Left   . TONSILLECTOMY  age 81  . VIDEO  BRONCHOSCOPY Bilateral 05/31/2013   Procedure: VIDEO BRONCHOSCOPY WITHOUT FLUORO;  Surgeon: Brand Males, MD;  Location: [place];  Service: Cardiopulmonary;  Laterality: Bilateral;  . video bronscoscopy  2000   lung   Past Surgical History:  Procedure Laterality Date  . ABDOMINAL HYSTERECTOMY    . BRAIN SURGERY     Gamma knife 10/13. Needs repeat spring  '14  . CARDIAC CATHETERIZATION N/A 10/31/2014   Procedure: Right/Left Heart Cath and Coronary Angiography;  Surgeon: [name], MD;  Location: [place] [facility];  Service: Cardiovascular;  Laterality: N/A;  . ESOPHAGOGASTRODUODENOSCOPY (EGD) WITH PROPOFOL N/A 09/14/2014   Procedure: ESOPHAGOGASTRODUODENOSCOPY (EGD) WITH PROPOFOL;  Surgeon: [name], MD;  [facility];  Service: Endoscopy;  Laterality: N/A;  . INCISION AND DRAINAGE HIP Left 01/16/2017   Procedure: IRRIGATION AND DEBRIDEMENT LEFT HIP;  Surgeon: [name], MD;  [facility]: [facility];  Service: Orthopedics;  Laterality: Left;  . INTRAMEDULLARY (IM) NAIL INTERTROCHANTERIC Left 11/29/2016   Procedure: INTRAMEDULLARY (IM) NAIL INTERTROCHANTRIC;  Surgeon: [name], MD;  Location: [place];  Service: Orthopedics;  Laterality: Left;  . OVARY SURGERY    . SHOULDER SURGERY Left   . TONSILLECTOMY  age 22  . VIDEO BRONCHOSCOPY Bilateral 05/31/2013   Procedure: VIDEO BRONCHOSCOPY WITHOUT FLUORO;  Surgeon: Brand Males, MD;  Location: [place];  Service: Cardiopulmonary;  Laterality: Bilateral;  . video bronscoscopy  2000   lung   Past Medical History:  Diagnosis Date  . Acute on chronic diastolic (congestive) heart failure ([place])   . Anemia    iron deficiency anemia - secondary to blood loss ( chronic)   . Arthritis    endstage changes bilateral knees/bilateral ankles.   . Asthma   . Carotid artery occlusion   . Chronic fatigue   . Chronic kidney disease   . Closed left hip fracture ([place])   . Clotting disorder ([place])     pt denies this  . Contusion of left knee    due to fall 1/14.  [name] COPD (chronic obstructive pulmonary disease) (HCC)    pulmonary fibrosis  . Depression, reactive   . Diabetes mellitus    type II   . Diastolic dysfunction   . Difficulty in walking   . Family history of heart disease   . Generalized muscle weakness   . Gout   . High cholesterol   . History of falling   . Hypertension   . Hypothyroidism   . Interstitial lung disease ([place])   . 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    . Morbid obesity ([place])   . Morbid obesity ([place])   . Neuromuscular disorder ([place])    diabetic neuropathy   . Normal coronary arteries    cardiac catheterization performed  10/31/14  . RA (rheumatoid arthritis) ([place])    has been off methotreaxte since 10/13.  [name] Rheumatoid arthritis ([place])   . Shortness of breath   . Spinal stenosis of lumbar region   . Thyroid disease   . Unspecified lack of coordination   . URI (upper respiratory infection)    There were no vitals taken for this visit.  Opioid Risk Score:   Fall Risk Score:  `1  Depression screen PHQ 2/9  Depression screen [facility] 2/9 12/14/2019 09/22/2019 06/11/2018 05/26/2018  Decreased Interest _0 Down, Depressed, Hopeless _1 PHQ - 2 Score _2 Altered sleeping - _3 Tired, decreased energy - _4 Change in appetite - _5 Feeling bad or failure about yourself  - _6 Trouble concentrating - 2 2 0  Moving slowly or fidgety/restless - 1 0 2  Suicidal thoughts - 0 0 0  PHQ-9 Score - _7 Difficult doing work/chores - Somewhat difficult - Somewhat difficult  Some recent data might be hidden   Review of Systems  Musculoskeletal: Positive for back pain and gait problem.       LEFT LEG DOWN TO LEFT KNEE PAIN RIGHT HAND PAIN LEFT SHOULDER PAIN  All other systems reviewed and are negative.      Objective:   Physical  Exam Vitals and nursing note reviewed.  Neck:     Comments: Cervical Paraspinal Tenderness: C-5-C-6 Cardiovascular:     Rate and Rhythm: Normal rate and regular rhythm.     Pulses: Normal pulses.     Heart sounds: Normal heart sounds.  Pulmonary:     Effort: Pulmonary effort is normal.     Breath sounds: Normal breath sounds.  Musculoskeletal:     Cervical back: Normal range of motion and neck supple.     Comments: Normal Muscle Bulk and Muscle Testing Reveals:  Upper Extremities: Full ROM and Muscle Strength 5/5 Bilateral AC Joint Tenderness  Thoracic and Lumbar Hypersensitivity Lower Extremities: Full ROM and Muscle Strength 5/5 Arrived in wheelchair   Skin:    General: Skin is warm and dry.  Neurological:     Mental Status: She is alert and oriented to person, place, and time.  Psychiatric:        Mood and Affect: Mood normal.        Behavior: Behavior normal.           Assessment & Plan:  1. Lumbar spinal stenosis with neurogenic claudication. Associated facet arthropathy/ Lumbar Radiculitis: Continuecurrent medication regime withGabapentinand HEP as tolerated.09/24/2020 Refilled:MS Contin 15 mg one tablet every 12 hours #60andMSIR 15 one tablet twice a dayas neededFor break through pain( 6 am and 1:00 pm)#60.since adverse effect with insomnia with Nucynta. Nucynta discontinued due to insomnia. We will continue the opioid monitoring program, this consists of regular clinic visits, examinations, urine drug screen, pill counts as well as use of [place] Controlled Substance Reporting system. A 12 month History has been reviewed on the [place] Controlled Substance Reporting System05/01/2021. 2. Chronic Bilateral Thoracic Back Pain: Continue HEP as Tolerated. Continue to Monitor.09/24/2020 3Depression: Continuecurrent medication Regime:Cymbalta.Continue to monitor.09/24/2020 4. Diabetes mellitus type 2 with polyneuropathy: Continuecurrent  medication regime:Gabapentin.09/24/2020 5. Rheumatoid arthritis and osteoarthritis. Continuecurrent medication Regime.Voltaren Gel. Rheumatology Following.09/24/2020 6. Interstitial lung disease: Pulmonology Following.09/24/2020. 7. Bilateral Osteoarthritis Knee's: Continuecurrent medication regimen.Voltaren Gel.09/24/2020. 8. Muscle Spasm: Continuecurrent medication regime: Flexeril.Continue to  monitor.09/24/2020 9.BilateralGreater Trochanteric Tenderness:L>R.Continuecurrent treatmentwith Ice/Heat Therapy.09/24/2020. 10. Polyarthralgia: Rheumatology Following: Continue to monitor.09/24/2020 11. Morbid Obesitity: Continue Healthy Diet Regime and HEP. Continue to monitor.09/24/2020 12. Right Shoulder tendonitis:No complaints today..Continue to Alternate with Heat and Ice Therapy. Continue to monitor.09/24/2020 13. Fall at Home: Educated on Fall prevention. She verbalizes understanding.   F/U in27month

## 2020-09-25 ENCOUNTER — Other Ambulatory Visit: Payer: Self-pay

## 2020-09-26 ENCOUNTER — Ambulatory Visit (INDEPENDENT_AMBULATORY_CARE_PROVIDER_SITE_OTHER): Payer: Medicare Other | Admitting: Family Medicine

## 2020-09-26 ENCOUNTER — Encounter: Payer: Self-pay | Admitting: Family Medicine

## 2020-09-26 VITALS — BP 118/88 | HR 105 | Temp 98.3°F

## 2020-09-26 DIAGNOSIS — E119 Type 2 diabetes mellitus without complications: Secondary | ICD-10-CM

## 2020-09-26 DIAGNOSIS — H269 Unspecified cataract: Secondary | ICD-10-CM | POA: Diagnosis not present

## 2020-09-26 DIAGNOSIS — I10 Essential (primary) hypertension: Secondary | ICD-10-CM | POA: Diagnosis not present

## 2020-09-26 DIAGNOSIS — E782 Mixed hyperlipidemia: Secondary | ICD-10-CM

## 2020-09-26 DIAGNOSIS — J849 Interstitial pulmonary disease, unspecified: Secondary | ICD-10-CM

## 2020-09-26 DIAGNOSIS — E1140 Type 2 diabetes mellitus with diabetic neuropathy, unspecified: Secondary | ICD-10-CM

## 2020-09-26 DIAGNOSIS — E039 Hypothyroidism, unspecified: Secondary | ICD-10-CM | POA: Diagnosis not present

## 2020-09-26 DIAGNOSIS — N183 Chronic kidney disease, stage 3 unspecified: Secondary | ICD-10-CM

## 2020-09-26 DIAGNOSIS — K219 Gastro-esophageal reflux disease without esophagitis: Secondary | ICD-10-CM | POA: Diagnosis not present

## 2020-09-26 DIAGNOSIS — E079 Disorder of thyroid, unspecified: Secondary | ICD-10-CM

## 2020-09-26 LAB — POCT GLYCOSYLATED HEMOGLOBIN (HGB A1C): Hemoglobin A1C: 5.6 % (ref 4.0–5.6)

## 2020-09-26 LAB — CBC WITH DIFFERENTIAL/PLATELET
Basophils Absolute: 0.1 10*3/uL (ref 0.0–0.1)
Basophils Relative: 0.6 % (ref 0.0–3.0)
Eosinophils Absolute: 0.4 10*3/uL (ref 0.0–0.7)
Eosinophils Relative: 4.3 % (ref 0.0–5.0)
HCT: 37.5 % (ref 36.0–46.0)
Hemoglobin: 12.1 g/dL (ref 12.0–15.0)
Lymphocytes Relative: 36.1 % (ref 12.0–46.0)
Lymphs Abs: 3.1 10*3/uL (ref 0.7–4.0)
MCHC: 32.2 g/dL (ref 30.0–36.0)
MCV: 93.5 fl (ref 78.0–100.0)
Monocytes Absolute: 0.9 10*3/uL (ref 0.1–1.0)
Monocytes Relative: 11 % (ref 3.0–12.0)
Neutro Abs: 4.1 10*3/uL (ref 1.4–7.7)
Neutrophils Relative %: 48 % (ref 43.0–77.0)
Platelets: 153 10*3/uL (ref 150.0–400.0)
RBC: 4.01 Mil/uL (ref 3.87–5.11)
RDW: 15.5 % (ref 11.5–15.5)
WBC: 8.5 10*3/uL (ref 4.0–10.5)

## 2020-09-26 LAB — COMPREHENSIVE METABOLIC PANEL
ALT: 22 U/L (ref 0–35)
AST: 15 U/L (ref 0–37)
Albumin: 3.6 g/dL (ref 3.5–5.2)
Alkaline Phosphatase: 86 U/L (ref 39–117)
BUN: 20 mg/dL (ref 6–23)
CO2: 36 mEq/L — ABNORMAL HIGH (ref 19–32)
Calcium: 8.7 mg/dL (ref 8.4–10.5)
Chloride: 101 mEq/L (ref 96–112)
Creatinine, Ser: 0.97 mg/dL (ref 0.40–1.20)
GFR: 55.84 mL/min — ABNORMAL LOW (ref >60.00)
Glucose, Bld: 108 mg/dL — ABNORMAL HIGH (ref 70–99)
Potassium: 3.5 mEq/L (ref 3.5–5.1)
Sodium: 144 mEq/L (ref 135–145)
Total Bilirubin: 0.3 mg/dL (ref 0.2–1.2)
Total Protein: 5.7 g/dL — ABNORMAL LOW (ref 6.0–8.3)

## 2020-09-26 LAB — LIPID PANEL
Cholesterol: 136 mg/dL (ref 0–200)
HDL: 40 mg/dL (ref >39.00)
NonHDL: 96
Total CHOL/HDL Ratio: 3
Triglycerides: 283 mg/dL — ABNORMAL HIGH (ref 0.0–149.0)
VLDL: 56.6 mg/dL — ABNORMAL HIGH (ref 0.0–40.0)

## 2020-09-26 LAB — TSH: TSH: 3.55 u[IU]/mL (ref 0.35–4.50)

## 2020-09-26 LAB — LDL CHOLESTEROL, DIRECT: Direct LDL: 59 mg/dL

## 2020-09-26 MED ORDER — PANTOPRAZOLE SODIUM 40 MG PO TBEC: DELAYED_RELEASE_TABLET | ORAL | 2 refills | Status: DC

## 2020-09-26 MED ORDER — OZEMPIC (0.25 OR 0.5 MG/DOSE) 2 MG/1.5ML ~~LOC~~ SOPN: 0.5000 mg | PEN_INJECTOR | SUBCUTANEOUS | 5 refills | Status: DC

## 2020-09-26 MED ORDER — OLMESARTAN MEDOXOMIL 40 MG PO TABS: 40.0000 mg | ORAL_TABLET | Freq: Every day | ORAL | 2 refills | Status: DC

## 2020-09-26 MED ORDER — LEVOTHYROXINE SODIUM 50 MCG PO TABS: 50.0000 ug | ORAL_TABLET | Freq: Every day | ORAL | 2 refills | Status: DC

## 2020-09-26 MED ORDER — ATORVASTATIN CALCIUM 20 MG PO TABS: 10.0000 mg | ORAL_TABLET | Freq: Every evening | ORAL | 2 refills | Status: DC

## 2020-09-26 NOTE — Progress Notes (Signed)
Subjective:    Patient ID: [name], female    DOB: 10-08-1940, 80 y.o.   MRN: [id number]  Chief Complaint  Patient presents with  . Follow-up  Pt accompanied by York Cerise who is assisting her.  HPI Patient was seen today for f/u on chronic conditions.  Pt states she is doing well overall.  Having difficulty getting portable O2 compressor for use when out of her home for ILD.  Using the company formerly [employer].  Pt with L eye pain.  Has h/o cataract.  Seen by Ophthalmology, no retinopathy noted.  Pt had a fall 1 month ago. Fell from a chair onto floor landing on butt.  Still sore.  Having aching in LLE after placement of rod.  LLE aches for hours in the morning x 1 month or more.  Doing less walking.  BP and bs stable.  Denies LE edema.  Requesting refill on lipitor, synthroid, olmesartan, protonix, and ozempic.  Past Medical History:  Diagnosis Date  . Acute on chronic diastolic (congestive) heart failure ([place])   . Anemia    iron deficiency anemia - secondary to blood loss ( chronic)   . Arthritis    endstage changes bilateral knees/bilateral ankles.   . Asthma   . Carotid artery occlusion   . Chronic fatigue   . Chronic kidney disease   . Closed left hip fracture ([place])   . Clotting disorder ([place])    pt denies this  . Contusion of left knee    due to fall 1/14.  [name] COPD (chronic obstructive pulmonary disease) (HCC)    pulmonary fibrosis  . Depression, reactive   . Diabetes mellitus    type II   . Diastolic dysfunction   . Difficulty in walking   . Family history of heart disease   . Generalized muscle weakness   . Gout   . High cholesterol   . History of falling   . Hypertension   . Hypothyroidism   . Interstitial lung disease ([place])   . 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    . Morbid obesity ([place])   . Morbid obesity ([place])   . Neuromuscular disorder ([place])    diabetic  neuropathy   . Normal coronary arteries    cardiac catheterization performed  10/31/14  . RA (rheumatoid arthritis) ([place])    has been off methotreaxte since 10/13.  [name] Rheumatoid arthritis ([place])   . Shortness of breath   . Spinal stenosis of lumbar region   . Thyroid disease   . Unspecified lack of coordination   . URI (upper respiratory infection)     Allergies  Allergen Reactions  . Codeine Swelling and Other (See Comments)    Facial swelling Chest pain and swelling in legs  Facial swelling  . Infliximab Anaphylaxis    "sent me into shock" "sent me into shock"  . Lisinopril Swelling    Face and neck swelling Face and neck swelling    ROS General: Denies fever, chills, night sweats, changes in weight, changes in appetite HEENT: Denies headaches, ear pain, changes in vision, rhinorrhea, sore throat CV: Denies CP, palpitations, orthopnea +SOB Pulm: Denies cough, wheezing +SOB GI: Denies abdominal pain, nausea, vomiting, diarrhea, constipation GU: Denies dysuria, hematuria, frequency, vaginal discharge Msk: Denies muscle cramps, joint pains  +LLE pain, soreness Neuro: Denies weakness, numbness, tingling Skin: Denies rashes, bruising Psych: Denies depression, anxiety, hallucinations  Objective:  Blood pressure 118/88, pulse (!) 105, temperature 98.3 F (36.8 C), temperature source Oral, SpO2 (!) 88 %. on RA.  Gen. Pleasant, well-nourished, in no distress, normal affect   HEENT: [place]/AT, face symmetric, conjunctiva clear, no scleral icterus, PERRLA, EOMI, nares patent without drainage. Neck: No JVD, no thyromegaly, no carotid bruits Lungs: no accessory muscle use, CTAB, no wheezes or rales Cardiovascular: tachycardia, no m/r/g, no peripheral edema Abdomen: BS present, soft, NT/ND. Musculoskeletal: No deformities, no cyanosis or clubbing, normal tone Neuro:  A&Ox3, CN II-XII intact, using rollator. Skin:  Warm, no lesions/ rash   Wt Readings from Last 3 Encounters:   09/24/20 236 lb (107 kg)  07/30/20 233 lb 6.4 oz (105.9 kg)  07/25/20 236 lb 6.4 oz (107.2 kg)    Lab Results  Component Value Date   WBC 6.7 11/10/2019   HGB 12.4 11/10/2019   HCT 41.8 11/10/2019   PLT 181 11/10/2019   GLUCOSE 110 (H) 11/10/2019   CHOL 161 09/22/2019   TRIG 194.0 (H) 09/22/2019   HDL 47.20 09/22/2019   LDLCALC 75 09/22/2019   ALT 36 04/21/2019   AST 33 04/21/2019   NA 149 (A) 08/01/2020   K 3.8 08/01/2020   CL 106 08/01/2020   CREATININE 1.0 08/01/2020   BUN 23 (A) 08/01/2020   CO2 35 (A) 08/01/2020   TSH 1.84 09/22/2019   INR 1.05 11/29/2016   HGBA1C 5.6 09/26/2020    Assessment/Plan:  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)  -Controlled -Hemoglobin A1c 7.0% on 09/22/2019 -Continue Ozempic 0.5 mg weekly -Continue lifestyle modifications -Diabetic retinopathy screen negative -Diabetic foot exam due -Continue ARB and statin - Plan: POCT glycosylated hemoglobin (Hb A1C), CBC with Differential/Platelet, Semaglutide,0.25 or 0.5MG/DOS, (OZEMPIC, 0.25 OR 0.5 MG/DOSE,) 2 MG/1.5ML SOPN  Cataract of both eyes, unspecified cataract type -Continue follow-up with ophthalmology  ILD (interstitial lung disease) ([place])  -Stable -Continue current medications and inhalers -Patient encouraged to contact medical supply company in regards to portable O2 compressor -Offered O2 while in clinic.  Patient declined. -Continue follow-up with pulmonology at [facility] and [place]: CBC with Differential/Platelet, Comprehensive metabolic panel  CRI (chronic renal insufficiency), stage 3 (moderate) (HCC)  - Plan: Comprehensive metabolic panel  Acquired hypothyroidism -Stable -TSH 1.84 on 09/22/2019 -Recheck TSH -Continue Synthroid 50 mcg daily - Plan: levothyroxine (SYNTHROID) 50 MCG tablet, TSH  Essential hypertension  -Controlled -Continue current medications including Lasix 80 mg, Toprol-XL 75 mg daily, olmesartan 40 mg daily, [place]-Con 20  mEq -Continue lifestyle modifications - Plan: olmesartan (BENICAR) 40 MG tablet  Gastroesophageal reflux disease, unspecified whether esophagitis present -Avoid foods known to cause problems - Plan: pantoprazole (PROTONIX) 40 MG tablet  Mixed hyperlipidemia  -controlled -continue lifestyle modifications -continue lipitor 10 mg - Plan: atorvastatin (LIPITOR) 20 MG tablet, Lipid panel  F/u in 3-4 months  Grier Mitts, MD

## 2020-09-26 NOTE — Patient Instructions (Addendum)
Cataract  A cataract is a buildup of protein that causes the lens of your eye to become cloudy. The lens is normally clear. It is the part of the eye that is behind your iris and pupil. The lens focuses light on the retina, which lets you see clearly. When a lens becomes cloudy, your vision may become blurry. The clouding can range from a tiny dot to complete cloudiness. As some cataracts develop, they can make it harder for you to see things that are far away. (You become more nearsighted.) Other cataracts increase glare. Cataracts can worsen over time, and sometimes the pupil can look white. As cataracts get worse, they cloud more of the lens, making it difficult to see. Cataracts can affect one eye or both eyes. What are the causes? This condition may be caused by age-related eye changes. The lens of the eye is mostly made up of water and protein. Normally, this protein is arranged in a way that keeps the lens clear. Cataracts develop when protein begins to clump together over time. This buildup of protein clouds the lens and lets less light pass through to the retina, which causes blurry vision. What increases the risk? You are more likely to develop this condition if you:  Are 28 years of age or older.  Have diabetes.  Have high blood pressure.  Take certain medicines, such as steroids or hormone replacement therapy.  Have had an eye injury.  Have or have had eye inflammation.  Have a family history of cataracts.  Smoke.  Drink alcohol heavily.  Are frequently exposed to sun or very strong light without eye protection.  Are obese.  Have been exposed to large amounts of radiation, lead, or other toxic substances.  Have had eye surgery. What are the signs or symptoms? The main symptom of a cataract is blurry vision. Your vision may change or get worse over time. Other symptoms include:  Increased glare.  Seeing a bright ring or halo around light.  Poor night  vision.  Double or "shadow" vision in one eye or both eyes.  Having trouble seeing, even while wearing contact lenses or glasses.  Seeing colors that appear faded.  Having trouble telling the difference between blue and purple.  Needing frequent changes to your prescription glasses or contacts. How is this diagnosed? This condition is diagnosed with a medical history and eye exam.  You should see an eye specialist (optometrist or ophthalmologist).  Your health care provider may enlarge (dilate) your pupils with eye drops to see the back of your eye more clearly and look for signs of cataracts or other eye damage. You may also have tests, including:  A visual acuity test. This uses a chart to determine the smallest letters that you can see from a specific distance.  A slit-lamp exam. This uses a microscope to examine small sections of your eye for abnormalities.  Tonometry. This test measures the pressure of the fluid inside your eye.  Glare testing. This test shines a light in your eye while you view letters to see whether the bright light affects your vision. How is this treated? Treatment depends on the stage of your cataract. You may:  Wear eyeglasses or use stronger light. This is for an early-stage cataract.  Have surgery if the condition is severely affecting your vision. This is needed for late-stage cataract.  Stop or change certain medicines. This is recommended if your health care provider thinks your cataract may be linked to your  medicines. Follow these instructions at home: Lifestyle  Use stronger or brighter lighting.  Consider using a magnifying glass for reading or other activities.  Become familiar with your surroundings. Having poor vision can put you at greater risk for tripping, falling, or bumping into things.  Wear sunglasses and a hat if you are sensitive to bright light or are having problems with glare.  Do not use any products that contain  nicotine or tobacco, such as cigarettes, e-cigarettes, and chewing tobacco. If you need help quitting, ask your health care provider. General instructions  If you are prescribed new eyeglasses, wear them as told by your health care provider.  Take over-the-counter and prescription medicines only as told by your health care provider. Do not change your medicines unless told by your health care provider.  Do not drive or use heavy machinery if your vision is blurry, particularly at night.  Keep your blood sugar under control if you have diabetes.  Keep all follow-up visits as told by your health care provider. This is important. Contact a health care provider if:  Your symptoms get worse.  Your vision affects your ability to perform daily activities.  You have new symptoms.  You have a fever. Get help right away if:  You have sudden vision loss.  You have redness, swelling, or increasing pain in your eye.  You develop a headache and sensitivity to light. Summary  A cataract is a buildup of protein that causes the lens of your eye to become cloudy. Cataracts are very common, especially as people age.  Mild cataracts cause mild visual symptoms, while more severe cataracts can cause a significant decrease in quality of life.  Mild cataracts can often be treated with a prescription for new glasses or contact lenses, while surgery is often recommended for more severe cataracts.  Contact a health care provider if your symptoms get worse, your vision affects your ability to do daily activities, or you have a fever.  Get help right away if you have sudden vision loss, redness, swelling, or increasing pain in the eye, or you develop a headache or sensitivity to light. This information is not intended to replace advice given to you by your health care provider. Make sure you discuss any questions you have with your health care provider. Document Revised: 11/02/2017 Document Reviewed:  11/02/2017 Elsevier Patient Education  2021 [place] for Gastroesophageal Reflux Disease, Adult When you have gastroesophageal reflux disease (GERD), the foods you eat and your eating habits are very important. Choosing the right foods can help ease your discomfort. Think about working with a food expert (dietitian) to help you make good choices. What are tips for following this plan? Reading food labels  Look for foods that are low in saturated fat. Foods that may help with your symptoms include: ? Foods that have less than 5% of daily value (DV) of fat. ? Foods that have 0 grams of trans fat. Cooking  Do not fry your food.  Cook your food by baking, steaming, grilling, or broiling. These are all methods that do not need a lot of fat for cooking.  To add flavor, try to use herbs that are low in spice and acidity. Meal planning  Choose healthy foods that are low in fat, such as: ? Fruits and vegetables. ? Whole grains. ? Low-fat dairy products. ? Lean meats, fish, and poultry.  Eat small meals often instead of eating 3 large meals each day. Eat your  meals slowly in a place where you are relaxed. Avoid bending over or lying down until 2-3 hours after eating.  Limit high-fat foods such as fatty meats or fried foods.  Limit your intake of fatty foods, such as oils, butter, and shortening.  Avoid the following as told by your doctor: ? Foods that cause symptoms. These may be different for different people. Keep a food diary to keep track of foods that cause symptoms. ? Alcohol. ? Drinking a lot of liquid with meals. ? Eating meals during the 2-3 hours before bed.   Lifestyle  Stay at a healthy weight. Ask your doctor what weight is healthy for you. If you need to lose weight, work with your doctor to do so safely.  Exercise for at least 30 minutes on 5 or more days each week, or as told by your doctor.  Wear loose-fitting clothes.  Do not smoke or use any  products that contain nicotine or tobacco. If you need help quitting, ask your doctor.  Sleep with the head of your bed higher than your feet. Use a wedge under the mattress or blocks under the bed frame to raise the head of the bed.  Chew sugar-free gum after meals. What foods should eat? Eat a healthy, well-balanced diet of fruits, vegetables, whole grains, low-fat dairy products, lean meats, fish, and poultry. Each person is different. Foods that may cause symptoms in one person may not cause any symptoms in another person. Work with your doctor to find foods that are safe for you. The items listed above may not be a complete list of what you can eat and drink. Contact a food expert for more options.   What foods should I avoid? Limiting some of these foods may help in managing the symptoms of GERD. Everyone is different. Talk with a food expert or your doctor to help you find the exact foods to avoid, if any. Fruits Any fruits prepared with added fat. Any fruits that cause symptoms. For some people, this may include citrus fruits, such as oranges, grapefruit, pineapple, and lemons. Vegetables Deep-fried vegetables. [place] fries. Any vegetables prepared with added fat. Any vegetables that cause symptoms. For some people, this may include tomatoes and tomato products, chili peppers, onions and garlic, and horseradish. Grains Pastries or quick breads with added fat. Meats and other proteins High-fat meats, such as fatty beef or pork, hot dogs, ribs, ham, sausage, salami, and bacon. Fried meat or protein, including fried fish and fried chicken. Nuts and nut butters, in large amounts. Dairy Whole milk and chocolate milk. Sour cream. Cream. Ice cream. Cream cheese. Milkshakes. Fats and oils Butter. Margarine. Shortening. Ghee. Beverages Coffee and tea, with or without caffeine. Carbonated beverages. Sodas. Energy drinks. Fruit juice made with acidic fruits, such as orange or grapefruit. Tomato  juice. Alcoholic drinks. Sweets and desserts Chocolate and cocoa. Donuts. Seasonings and condiments Pepper. Peppermint and spearmint. Added salt. Any condiments, herbs, or seasonings that cause symptoms. For some people, this may include curry, hot sauce, or vinegar-based salad dressings. The items listed above may not be a complete list of what you should not eat and drink. Contact a food expert for more options. Questions to ask your doctor Diet and lifestyle changes are often the first steps that are taken to manage symptoms of GERD. If diet and lifestyle changes do not help, talk with your doctor about taking medicines. Where to find more information  International Foundation for Gastrointestinal Disorders: aboutgerd.org Summary  When  you have GERD, food and lifestyle choices are very important in easing your symptoms.  Eat small meals often instead of 3 large meals a day. Eat your meals slowly and in a place where you are relaxed.  Avoid bending over or lying down until 2-3 hours after eating.  Limit high-fat foods such as fatty meats or fried foods. This information is not intended to replace advice given to you by your health care provider. Make sure you discuss any questions you have with your health care provider. Document Revised: 11/14/2019 Document Reviewed: 11/14/2019 Elsevier Patient Education  [place].  Diabetes Mellitus and Nutrition, Adult When you have diabetes, or diabetes mellitus, it is very important to have healthy eating habits because your blood sugar (glucose) levels are greatly affected by what you eat and drink. Eating healthy foods in the right amounts, at about the same times every day, can help you:  Control your blood glucose.  Lower your risk of heart disease.  Improve your blood pressure.  Reach or maintain a healthy weight. What can affect my meal plan? Every person with diabetes is different, and each person has different needs for a  meal plan. Your health care provider may recommend that you work with a dietitian to make a meal plan that is best for you. Your meal plan may vary depending on factors such as:  The calories you need.  The medicines you take.  Your weight.  Your blood glucose, blood pressure, and cholesterol levels.  Your activity level.  Other health conditions you have, such as heart or kidney disease. How do carbohydrates affect me? Carbohydrates, also called carbs, affect your blood glucose level more than any other type of food. Eating carbs naturally raises the amount of glucose in your blood. Carb counting is a method for keeping track of how many carbs you eat. Counting carbs is important to keep your blood glucose at a healthy level, especially if you use insulin or take certain oral diabetes medicines. It is important to know how many carbs you can safely have in each meal. This is different for every person. Your dietitian can help you calculate how many carbs you should have at each meal and for each snack. How does alcohol affect me? Alcohol can cause a sudden decrease in blood glucose (hypoglycemia), especially if you use insulin or take certain oral diabetes medicines. Hypoglycemia can be a life-threatening condition. Symptoms of hypoglycemia, such as sleepiness, dizziness, and confusion, are similar to symptoms of having too much alcohol.  Do not drink alcohol if: ? Your health care provider tells you not to drink. ? You are pregnant, may be pregnant, or are planning to become pregnant.  If you drink alcohol: ? Do not drink on an empty stomach. ? Limit how much you use to:  0-1 drink a day for women.  0-2 drinks a day for men. ? Be aware of how much alcohol is in your drink. In the [place] one drink equals one 12 oz bottle of beer (355 mL), one 5 oz glass of wine (148 mL), or one 1 oz glass of hard liquor (44 mL). ? Keep yourself hydrated with water, diet soda, or unsweetened iced  tea.  Keep in mind that regular soda, juice, and other mixers may contain a lot of sugar and must be counted as carbs. What are tips for following this plan? Reading food labels  Start by checking the serving size on the "Nutrition Facts" label of packaged  foods and drinks. The amount of calories, carbs, fats, and other nutrients listed on the label is based on one serving of the item. Many items contain more than one serving per package.  Check the total grams (g) of carbs in one serving. You can calculate the number of servings of carbs in one serving by dividing the total carbs by 15. For example, if a food has 30 g of total carbs per serving, it would be equal to 2 servings of carbs.  Check the number of grams (g) of saturated fats and trans fats in one serving. Choose foods that have a low amount or none of these fats.  Check the number of milligrams (mg) of salt (sodium) in one serving. Most people should limit total sodium intake to less than 2,300 mg per day.  Always check the nutrition information of foods labeled as "low-fat" or "nonfat." These foods may be higher in added sugar or refined carbs and should be avoided.  Talk to your dietitian to identify your daily goals for nutrients listed on the label. Shopping  Avoid buying canned, pre-made, or processed foods. These foods tend to be high in fat, sodium, and added sugar.  Shop around the outside edge of the grocery store. This is where you will most often find fresh fruits and vegetables, bulk grains, fresh meats, and fresh dairy. Cooking  Use low-heat cooking methods, such as baking, instead of high-heat cooking methods like deep frying.  Cook using healthy oils, such as olive, canola, or sunflower oil.  Avoid cooking with butter, cream, or high-fat meats. Meal planning  Eat meals and snacks regularly, preferably at the same times every day. Avoid going long periods of time without eating.  Eat foods that are high in  fiber, such as fresh fruits, vegetables, beans, and whole grains. Talk with your dietitian about how many servings of carbs you can eat at each meal.  Eat 4-6 oz (112-168 g) of lean protein each day, such as lean meat, chicken, fish, eggs, or tofu. One ounce (oz) of lean protein is equal to: ? 1 oz (28 g) of meat, chicken, or fish. ? 1 egg. ?  cup (62 g) of tofu.  Eat some foods each day that contain healthy fats, such as avocado, nuts, seeds, and fish.   What foods should I eat? Fruits Berries. Apples. Oranges. Peaches. Apricots. Plums. Grapes. Mango. Papaya. Pomegranate. Kiwi. Cherries. Vegetables Lettuce. Spinach. Leafy greens, including kale, chard, collard greens, and mustard greens. Beets. Cauliflower. Cabbage. Broccoli. Carrots. Green beans. Tomatoes. Peppers. Onions. Cucumbers. Brussels sprouts. Grains Whole grains, such as whole-wheat or whole-grain bread, crackers, tortillas, cereal, and pasta. Unsweetened oatmeal. Quinoa. Brown or wild rice. Meats and other proteins Seafood. Poultry without skin. Lean cuts of poultry and beef. Tofu. Nuts. Seeds. Dairy Low-fat or fat-free dairy products such as milk, yogurt, and cheese. The items listed above may not be a complete list of foods and beverages you can eat. Contact a dietitian for more information. What foods should I avoid? Fruits Fruits canned with syrup. Vegetables Canned vegetables. Frozen vegetables with butter or cream sauce. Grains Refined white flour and flour products such as bread, pasta, snack foods, and cereals. Avoid all processed foods. Meats and other proteins Fatty cuts of meat. Poultry with skin. Breaded or fried meats. Processed meat. Avoid saturated fats. Dairy Full-fat yogurt, cheese, or milk. Beverages Sweetened drinks, such as soda or iced tea. The items listed above may not be a complete list of foods  and beverages you should avoid. Contact a dietitian for more information. Questions to ask a health  care provider  Do I need to meet with a diabetes educator?  Do I need to meet with a dietitian?  What number can I call if I have questions?  When are the best times to check my blood glucose? Where to find more information:  American Diabetes Association: diabetes.org  Academy of Nutrition and Dietetics: www.eatright.[employer] of Diabetes and Digestive and Kidney Diseases: [employer]  Association of Diabetes Care and Education Specialists: www.diabeteseducator.org Summary  It is important to have healthy eating habits because your blood sugar (glucose) levels are greatly affected by what you eat and drink.  A healthy meal plan will help you control your blood glucose and maintain a healthy lifestyle.  Your health care provider may recommend that you work with a dietitian to make a meal plan that is best for you.  Keep in mind that carbohydrates (carbs) and alcohol have immediate effects on your blood glucose levels. It is important to count carbs and to use alcohol carefully. This information is not intended to replace advice given to you by your health care provider. Make sure you discuss any questions you have with your health care provider. Document Revised: 04/12/2019 Document Reviewed: 04/12/2019 Elsevier Patient Education  2021 [place] Prevention in the Home, Adult Falls can cause injuries and can happen to people of all ages. There are many things you can do to make your home safe and to help prevent falls. Ask for help when making these changes. What actions can I take to prevent falls? General Instructions  Use good lighting in all rooms. Replace any light bulbs that burn out.  Turn on the lights in dark areas. Use night-lights.  Keep items that you use often in easy-to-reach places. Lower the shelves around your home if needed.  Set up your furniture so you have a clear path. Avoid moving your furniture around.  Do not have  throw rugs or other things on the floor that can make you trip.  Avoid walking on wet floors.  If any of your floors are uneven, fix them.  Add color or contrast paint or tape to clearly mark and help you see: ? Grab bars or handrails. ? First and last steps of staircases. ? Where the edge of each step is.  If you use a stepladder: ? Make sure that it is fully opened. Do not climb a closed stepladder. ? Make sure the sides of the stepladder are locked in place. ? Ask someone to hold the stepladder while you use it.  Know where your pets are when moving through your home. What can I do in the bathroom?  Keep the floor dry. Clean up any water on the floor right away.  Remove soap buildup in the tub or shower.  Use nonskid mats or decals on the floor of the tub or shower.  Attach bath mats securely with double-sided, nonslip rug tape.  If you need to sit down in the shower, use a plastic, nonslip stool.  Install grab bars by the toilet and in the tub and shower. Do not use towel bars as grab bars.      What can I do in the bedroom?  Make sure that you have a light by your bed that is easy to reach.  Do not use any sheets or blankets for your bed that hang to the floor.  Have a firm chair with side arms that you can use for support when you get dressed. What can I do in the kitchen?  Clean up any spills right away.  If you need to reach something above you, use a step stool with a grab bar.  Keep electrical cords out of the way.  Do not use floor polish or wax that makes floors slippery. What can I do with my stairs?  Do not leave any items on the stairs.  Make sure that you have a light switch at the top and the bottom of the stairs.  Make sure that there are handrails on both sides of the stairs. Fix handrails that are broken or loose.  Install nonslip stair treads on all your stairs.  Avoid having throw rugs at the top or bottom of the stairs.  Choose a  carpet that does not hide the edge of the steps on the stairs.  Check carpeting to make sure that it is firmly attached to the stairs. Fix carpet that is loose or worn. What can I do on the outside of my home?  Use bright outdoor lighting.  Fix the edges of walkways and driveways and fix any cracks.  Remove anything that might make you trip as you walk through a door, such as a raised step or threshold.  Trim any bushes or trees on paths to your home.  Check to see if handrails are loose or broken and that both sides of all steps have handrails.  Install guardrails along the edges of any raised decks and porches.  Clear paths of anything that can make you trip, such as tools or rocks.  Have leaves, snow, or ice cleared regularly.  Use sand or salt on paths during winter.  Clean up any spills in your garage right away. This includes grease or oil spills. What other actions can I take?  Wear shoes that: ? Have a low heel. Do not wear high heels. ? Have rubber bottoms. ? Feel good on your feet and fit well. ? Are closed at the toe. Do not wear open-toe sandals.  Use tools that help you move around if needed. These include: ? Canes. ? Walkers. ? Scooters. ? Crutches.  Review your medicines with your doctor. Some medicines can make you feel dizzy. This can increase your chance of falling. Ask your doctor what else you can do to help prevent falls. Where to find more information  Centers for Disease Control and Prevention, [place]: http://www.wolf.info/  National Institute on Aging: http://kim-miller.com/ Contact a doctor if:  You are afraid of falling at home.  You feel weak, drowsy, or dizzy at home.  You fall at home. Summary  There are many simple things that you can do to make your home safe and to help prevent falls.  Ways to make your home safe include removing things that can make you trip and installing grab bars in the bathroom.  Ask for help when making these changes in  your home. This information is not intended to replace advice given to you by your health care provider. Make sure you discuss any questions you have with your health care provider. Document Revised: 12/07/2019 Document Reviewed: 12/07/2019 Elsevier Patient Education  [place].

## 2020-10-04 ENCOUNTER — Telehealth: Payer: Self-pay | Admitting: Adult Health

## 2020-10-04 DIAGNOSIS — J9611 Chronic respiratory failure with hypoxia: Secondary | ICD-10-CM

## 2020-10-04 NOTE — Telephone Encounter (Signed)
I called the pt and spoke with her.  I advised her that an order was placed back in March 2022 for a POC.  She stated that they ( [facility]) told her that they would need another order for this.  I have placed the order and sent it in.

## 2020-10-05 ENCOUNTER — Telehealth: Payer: Self-pay | Admitting: Adult Health

## 2020-10-05 NOTE — Telephone Encounter (Signed)
This was not signed as of last night I sent it this morning once it was signed and I have got confirmation back that they received it

## 2020-10-05 NOTE — Telephone Encounter (Signed)
Pt aware that [facility] has the order

## 2020-10-08 NOTE — Progress Notes (Signed)
Spoke with patient, is aware.

## 2020-10-09 ENCOUNTER — Other Ambulatory Visit: Payer: Self-pay

## 2020-10-09 DIAGNOSIS — K5903 Drug induced constipation: Secondary | ICD-10-CM

## 2020-10-09 MED ORDER — LINACLOTIDE 72 MCG PO CAPS: 72.0000 ug | ORAL_CAPSULE | Freq: Every day | ORAL | 1 refills | Status: DC

## 2020-10-10 ENCOUNTER — Other Ambulatory Visit: Payer: Medicare Other | Admitting: Nurse Practitioner

## 2020-10-10 ENCOUNTER — Other Ambulatory Visit: Payer: Self-pay

## 2020-10-10 ENCOUNTER — Encounter: Payer: Self-pay | Admitting: Nurse Practitioner

## 2020-10-10 DIAGNOSIS — H25013 Cortical age-related cataract, bilateral: Secondary | ICD-10-CM | POA: Diagnosis not present

## 2020-10-10 DIAGNOSIS — H2513 Age-related nuclear cataract, bilateral: Secondary | ICD-10-CM | POA: Diagnosis not present

## 2020-10-10 DIAGNOSIS — R5381 Other malaise: Secondary | ICD-10-CM | POA: Diagnosis not present

## 2020-10-10 DIAGNOSIS — Z515 Encounter for palliative care: Secondary | ICD-10-CM | POA: Diagnosis not present

## 2020-10-10 NOTE — Progress Notes (Signed)
[place] Consult Note Telephone: [phone]  Fax: [phone]    Date of encounter: 10/10/20 PATIENT NAME: [name] [address] [place] [address]   [phone] (home)  DOB: May 12, 1941 MRN: [id number] PRIMARY CARE PROVIDER:    [name], MD,  [place] [address] [id number]  RESPONSIBLE PARTY:    Contact Information    Name Relation Home Work Mobile   Farrow,Curtis Son [phone]  [phone]   Linna Darner [phone]  [phone]   AmeLie, Hollars Spouse [phone]  [phone]   Shelly Coss Daughter [phone]     Jerelyn Scott Daughter   [phone]     Due to the COVID-19 crisis, this visit was done via telemedicine from my office and it was initiated and consent by this patient and or family.  I connected with  [name] on 10/10/20 by a video enabled telemedicine application and verified that I am speaking with the correct person.   I discussed the limitations of evaluation and management by telemedicine. The patient expressed understanding and agreed to proceed.Palliative Care was asked to follow this patient by consultation request of  Joan Ruddy, MD to address advance care planning and complex medical decision making. This is a follow up visit. ASSESSMENT AND PLAN / RECOMMENDATIONS:  Symptom Management/Plan: 1.ACP: full code currently with aggressive interventions.   2. Debility secondary to RA; continue current regimen, pain management; encourage mobility with safety, fall risk.   3.Palliative care encounter Palliative medicine team will continue to support patient, patient's family, and medical team. Visit consisted of counseling and education dealing with the complex and emotionally intense issues of symptom management and palliative care in the setting of serious and potentially life-threatening   Follow up Palliative  Care Visit: Palliative care will continue to follow for complex medical decision making, advance care planning, and clarification of goals. Return 6 weeks or prn.  I spent 40 minutes providing this consultation. More than 50% of the time in this consultation was spent in counseling and care coordination.  PPS: 50%  HOSPICE ELIGIBILITY/DIAGNOSIS: TBD  Chief Complaint: Follow up palliative consult for complex medical decision making  HISTORY OF PRESENT ILLNESS:  [name] is a 80 y.o. year old female  with Chronic diastolic congestive heart failure, pulmonary fibrosis, interstitial lung disease, chronic respiratory failure with hypoxia, dysphasia, aphasia, carotid artery occlusion, COPD,history of DVT, chronic kidney disease, Rheumatoid arthritis, anemia, meningioma of left sphenoid wing involving cavernous sinus, hypothyroidism, hypertension, gerd, spinal stenosis of lumbar region,osteoarthritis, hypercholesterolemia, gout, morbid obesity, arthritis, cataract,chronic pain, history of left hip fracture s/p nail, tonsillectomy, left shoulder surgery, ovarian surgery, brain surgery, abdominal hysterectomy. I called [name] for scheduled telemedicine telephonic is video not available follow up palliative care visit. [name] in agreement. We talked about how [name] has been feeling. [name] endorses that she had a recent fall which has made her more debilitated.[name] endorses since the fall she has been more tired. Appetite has been decreased as she has lost four more pounds which weight loss has been one of her goals. We talked about symptoms of pain. We talked about symptoms of shortness of breath, tired and fatigue. [name] endorses that her O2 saturations are in the 80s with exertion at times in that oxygen was ordered three weeks ago but not delivered. [name] talked about the challenges she has been having with the medical supply company trying to get her  oxygen. We talked about different options and troubleshooting. We talked about current regiment for shortness of breath.Medical goals reviewed. We talked about quality of life, limited activity. [name] endorses she has been more isolated socially recently though she does continue to talk to her friends on the phone. [name] endorses she has been having a harder time lately but today's discussion has made her feel better. We talked about role of palliative care and plan of care period we talked about follow up visit to be in person, [name] in agreement and scheduled. Therapeutic listening, emotional support provided. Contact information. Questions answered to satisfaction.  History obtained from review of EMR, discussion with  [name].  I reviewed available labs, medications, imaging, studies and related documents from the EMR.  Records reviewed and summarized above.   ROS Full 14 system review of systems performed and negative with exception of: as per HPI.   Physical Exam: Deferred  Questions and concerns were addressed. The patient/family was encouraged to call with questions and/or concerns. My contact information was provided. Provided general support and encouragement, no other unmet needs identified  Thank you for the opportunity to participate in the care of [name].  The palliative care team will continue to follow. Please call our office at [phone] if we can be of additional assistance.   This chart was dictated using voice recognition software. Despite best efforts to proofread, errors can occur which can change the documentation meaning.   Chassie Pennix Ihor Gully, NP , MSN,  [facility]

## 2020-10-12 ENCOUNTER — Ambulatory Visit: Payer: Medicare Other

## 2020-10-12 ENCOUNTER — Telehealth: Payer: Self-pay | Admitting: Internal Medicine

## 2020-10-12 DIAGNOSIS — J849 Interstitial pulmonary disease, unspecified: Secondary | ICD-10-CM

## 2020-10-12 NOTE — Telephone Encounter (Signed)
Spoke to patient, who is requesting to switch to [place]. She was advised by [facility] that she could not be evaluated for POC until middle of June. Patient has been with [facility] since 2014. Order has been placed to [facility] per patient request.  Nothing further needed.

## 2020-10-18 ENCOUNTER — Telehealth: Payer: Self-pay | Admitting: Internal Medicine

## 2020-10-18 ENCOUNTER — Ambulatory Visit (INDEPENDENT_AMBULATORY_CARE_PROVIDER_SITE_OTHER): Payer: Medicare Other

## 2020-10-18 DIAGNOSIS — Z0000 Encounter for general adult medical examination without abnormal findings: Secondary | ICD-10-CM | POA: Diagnosis not present

## 2020-10-18 DIAGNOSIS — Z741 Need for assistance with personal care: Secondary | ICD-10-CM | POA: Diagnosis not present

## 2020-10-18 DIAGNOSIS — Z780 Asymptomatic menopausal state: Secondary | ICD-10-CM

## 2020-10-18 NOTE — Telephone Encounter (Signed)
Yes to both. We can have Dr. Volanda Napoleon place order for POC if she had a qualifying walk with them. We can placed order for percussion vest re: bronchiectasis

## 2020-10-18 NOTE — Telephone Encounter (Signed)
Called and spoke with Vinnie Level.  Ashly stated Patient is scheduled with Beth, NP 10/22/20, for OV and POC qualifying walk. Ashly stated Patient was seen 09/26/20, by Dr. Volanda Napoleon, with walk, but Dr. Volanda Napoleon did not place order.  Ashly stated he could call and see if Dr. Volanda Napoleon would place POC order. Ashly stated he could use walk, OV note, and order from Dr. Volanda Napoleon.If Beth, NP OV with her is not necessary at this time. Ashly also stated Patient insurance will cover entire cost for a percussion vest for bronchiectasis, if Beth, NP would like to place order with [facility].  Message routed to [facility], NP to advise on 6/6 OV and percussion vest

## 2020-10-18 NOTE — Telephone Encounter (Signed)
I have called and spoke with Ashely with [employer] and he stated that he wanted to reach out to see if the pt doesn't make it to her appt on Monday with BW, he will reach out to her PCP and get the order for the POC.  He stated that he will drop off the pre filled form for the vest in the morning.  Will forward to EW to make her aware.

## 2020-10-18 NOTE — Patient Instructions (Signed)
Ms. Korpela , Thank you for taking time to come for your Medicare Wellness Visit. I appreciate your ongoing commitment to your health goals. Please review the following plan we discussed and let me know if I can assist you in the future.   Screening recommendations/referrals: Colonoscopy: no longer required  Mammogram: no longer required  Bone Density: referral completed 10/18/2020 Recommended yearly ophthalmology/optometry visit for glaucoma screening and checkup Recommended yearly dental visit for hygiene and checkup  Vaccinations: Influenza vaccine: due in fall 2022 Pneumococcal vaccine: completed series  Tdap vaccine: current 12/26/2025 Shingles vaccine: will obtain local pharmacy   Advanced directives: will provide copies   Conditions/risks identified: none   Next appointment: 11/01/2020  _0   Dr.Banks    Preventive Care 79 Years and Older, Female Preventive care refers to lifestyle choices and visits with your health care provider that can promote health and wellness. What does preventive care include?  A yearly physical exam. This is also called an annual well check.  Dental exams once or twice a year.  Routine eye exams. Ask your health care provider how often you should have your eyes checked.  Personal lifestyle choices, including:  Daily care of your teeth and gums.  Regular physical activity.  Eating a healthy diet.  Avoiding tobacco and drug use.  Limiting alcohol use.  Practicing safe sex.  Taking low-dose aspirin every day.  Taking vitamin and mineral supplements as recommended by your health care provider. What happens during an annual well check? The services and screenings done by your health care provider during your annual well check will depend on your age, overall health, lifestyle risk factors, and family history of disease. Counseling  Your health care provider may ask you questions about your:  Alcohol use.  Tobacco use.  Drug  use.  Emotional well-being.  Home and relationship well-being.  Sexual activity.  Eating habits.  History of falls.  Memory and ability to understand (cognition).  Work and work Statistician.  Reproductive health. Screening  You may have the following tests or measurements:  Height, weight, and BMI.  Blood pressure.  Lipid and cholesterol levels. These may be checked every 5 years, or more frequently if you are over 1 years old.  Skin check.  Lung cancer screening. You may have this screening every year starting at age 27 if you have a 30-pack-year history of smoking and currently smoke or have quit within the past 15 years.  Fecal occult blood test (FOBT) of the stool. You may have this test every year starting at age 110.  Flexible sigmoidoscopy or colonoscopy. You may have a sigmoidoscopy every 5 years or a colonoscopy every 10 years starting at age 16.  Hepatitis C blood test.  Hepatitis B blood test.  Sexually transmitted disease (STD) testing.  Diabetes screening. This is done by checking your blood sugar (glucose) after you have not eaten for a while (fasting). You may have this done every 1-3 years.  Bone density scan. This is done to screen for osteoporosis. You may have this done starting at age 86.  Mammogram. This may be done every 1-2 years. Talk to your health care provider about how often you should have regular mammograms. Talk with your health care provider about your test results, treatment options, and if necessary, the need for more tests. Vaccines  Your health care provider may recommend certain vaccines, such as:  Influenza vaccine. This is recommended every year.  Tetanus, diphtheria, and acellular pertussis (Tdap, Td) vaccine. You  may need a Td booster every 10 years.  Zoster vaccine. You may need this after age 2.  Pneumococcal 13-valent conjugate (PCV13) vaccine. One dose is recommended after age 53.  Pneumococcal polysaccharide  (PPSV23) vaccine. One dose is recommended after age 48. Talk to your health care provider about which screenings and vaccines you need and how often you need them. This information is not intended to replace advice given to you by your health care provider. Make sure you discuss any questions you have with your health care provider. Document Released: 06/01/2015 Document Revised: 01/23/2016 Document Reviewed: 03/06/2015 Elsevier Interactive Patient Education  2017 [place] Prevention in the Home Falls can cause injuries. They can happen to people of all ages. There are many things you can do to make your home safe and to help prevent falls. What can I do on the outside of my home?  Regularly fix the edges of walkways and driveways and fix any cracks.  Remove anything that might make you trip as you walk through a door, such as a raised step or threshold.  Trim any bushes or trees on the path to your home.  Use bright outdoor lighting.  Clear any walking paths of anything that might make someone trip, such as rocks or tools.  Regularly check to see if handrails are loose or broken. Make sure that both sides of any steps have handrails.  Any raised decks and porches should have guardrails on the edges.  Have any leaves, snow, or ice cleared regularly.  Use sand or salt on walking paths during winter.  Clean up any spills in your garage right away. This includes oil or grease spills. What can I do in the bathroom?  Use night lights.  Install grab bars by the toilet and in the tub and shower. Do not use towel bars as grab bars.  Use non-skid mats or decals in the tub or shower.  If you need to sit down in the shower, use a plastic, non-slip stool.  Keep the floor dry. Clean up any water that spills on the floor as soon as it happens.  Remove soap buildup in the tub or shower regularly.  Attach bath mats securely with double-sided non-slip rug tape.  Do not have  throw rugs and other things on the floor that can make you trip. What can I do in the bedroom?  Use night lights.  Make sure that you have a light by your bed that is easy to reach.  Do not use any sheets or blankets that are too big for your bed. They should not hang down onto the floor.  Have a firm chair that has side arms. You can use this for support while you get dressed.  Do not have throw rugs and other things on the floor that can make you trip. What can I do in the kitchen?  Clean up any spills right away.  Avoid walking on wet floors.  Keep items that you use a lot in easy-to-reach places.  If you need to reach something above you, use a strong step stool that has a grab bar.  Keep electrical cords out of the way.  Do not use floor polish or wax that makes floors slippery. If you must use wax, use non-skid floor wax.  Do not have throw rugs and other things on the floor that can make you trip. What can I do with my stairs?  Do not leave any items  on the stairs.  Make sure that there are handrails on both sides of the stairs and use them. Fix handrails that are broken or loose. Make sure that handrails are as long as the stairways.  Check any carpeting to make sure that it is firmly attached to the stairs. Fix any carpet that is loose or worn.  Avoid having throw rugs at the top or bottom of the stairs. If you do have throw rugs, attach them to the floor with carpet tape.  Make sure that you have a light switch at the top of the stairs and the bottom of the stairs. If you do not have them, ask someone to add them for you. What else can I do to help prevent falls?  Wear shoes that:  Do not have high heels.  Have rubber bottoms.  Are comfortable and fit you well.  Are closed at the toe. Do not wear sandals.  If you use a stepladder:  Make sure that it is fully opened. Do not climb a closed stepladder.  Make sure that both sides of the stepladder are  locked into place.  Ask someone to hold it for you, if possible.  Clearly mark and make sure that you can see:  Any grab bars or handrails.  First and last steps.  Where the edge of each step is.  Use tools that help you move around (mobility aids) if they are needed. These include:  Canes.  Walkers.  Scooters.  Crutches.  Turn on the lights when you go into a dark area. Replace any light bulbs as soon as they burn out.  Set up your furniture so you have a clear path. Avoid moving your furniture around.  If any of your floors are uneven, fix them.  If there are any pets around you, be aware of where they are.  Review your medicines with your doctor. Some medicines can make you feel dizzy. This can increase your chance of falling. Ask your doctor what other things that you can do to help prevent falls. This information is not intended to replace advice given to you by your health care provider. Make sure you discuss any questions you have with your health care provider. Document Released: 03/01/2009 Document Revised: 10/11/2015 Document Reviewed: 06/09/2014 Elsevier Interactive Patient Education  2017 [employer].

## 2020-10-18 NOTE — Progress Notes (Signed)
Subjective:   [name] is a 81 y.o. female who presents for Medicare Annual (Subsequent) preventive examination. Patient unable to connect to caregility . Virtual Visit via Video Note  I connected with Marianita [name]  by a video enabled telemedicine application and verified that I am speaking with the correct person using two identifiers.  Location: Patient: Home Provider: Office Persons participating in the virtual visit: patient, provider   I discussed the limitations of evaluation and management by telemedicine and the availability of in person appointments. The patient expressed understanding and agreed to proceed.     Randel Pigg ,LPN   Review of Systems    n/a       Objective:    There were no vitals filed for this visit. There is no height or weight on file to calculate BMI.  Advanced Directives 09/22/2019 04/22/2019 04/22/2019 04/21/2019 06/02/2018 01/13/2018 03/10/2017  Does Patient Have a Medical Advance Directive? Yes - - No No Yes Yes  Type of Advance Directive Living will - - - Public librarian;Living will [place];Living will  Does patient want to make changes to medical advance directive? - - - - - - No - Patient declined  Copy of [place] in Chart? - - - - - No - copy requested -  Would patient like information on creating a medical advance directive? - No - Patient declined No - Patient declined No - Patient declined No - Patient declined - -  Pre-existing out of facility DNR order (yellow form or pink MOST form) - - - - - - -    Current Medications (verified) Outpatient Encounter Medications as of 10/18/2020  Medication Sig  . acetaminophen (TYLENOL) 500 MG tablet Take 1,000 mg by mouth every 6 (six) hours as needed for moderate pain.   [name] albuterol (PROVENTIL HFA;VENTOLIN HFA) 108 (90 BASE) MCG/ACT inhaler Inhale 2 puffs into the lungs every 6 (six) hours as needed for wheezing or shortness of  breath.  . allopurinol (ZYLOPRIM) 300 MG tablet Take 300 mg by mouth daily.  [name] aspirin buffered (BUFFERIN) 325 MG TABS tablet Take 325 mg by mouth daily.  [name] atorvastatin (LIPITOR) 20 MG tablet Take 0.5 tablets (10 mg total) by mouth every evening.  [name] azithromycin (ZITHROMAX) 250 MG tablet Take 2 tabs on day one.  Then take 1 tab daily on days 2-7. (Patient taking differently: Take 2 tabs on day one.  Then take 1 tab daily on days 2-7.)  . Calcium Carb-Cholecalciferol (CALCIUM + D3 PO) Take 2 tablets by mouth daily with lunch.   . cholecalciferol (VITAMIN D) 1000 UNITS tablet Take 2,000 Units by mouth daily with lunch.   . colchicine 0.6 MG tablet Take 0.6 mg by mouth daily.  . cyclobenzaprine (FLEXERIL) 5 MG tablet Take 1 tablet (5 mg total) by mouth at bedtime as needed for muscle spasms.  . diclofenac sodium (VOLTAREN) 1 % GEL Apply 2 g topically 3 (three) times daily. Both hands and knees  . DULoxetine (CYMBALTA) 60 MG capsule Take 1 capsule (60 mg total) by mouth daily.  . fluticasone (FLONASE) 50 MCG/ACT nasal spray Place 2 sprays into both nostrils daily.  . furosemide (LASIX) 80 MG tablet Take 80 mg by mouth.  . gabapentin (NEURONTIN) 600 MG tablet Take two in the morning , Two mid- afternoon and two at bedtime  . guaiFENesin (MUCINEX) 600 MG 12 hr tablet Take 1,200 mg by mouth 2 (two) times  daily.  . hydroxychloroquine (PLAQUENIL) 200 MG tablet Take 400 mg by mouth daily.   [name] ipratropium-albuterol (DUONEB) 0.5-2.5 (3) MG/3ML SOLN Take 3 mLs by nebulization every 6 (six) hours as needed (cough/wheezing).   [name] leflunomide (ARAVA) 10 MG tablet Take 2 tablets (20 mg total) by mouth daily.  [name] leflunomide (ARAVA) 20 MG tablet Take by mouth.  . levothyroxine (SYNTHROID) 50 MCG tablet Take 1 tablet (50 mcg total) by mouth daily.  [name] linaclotide (LINZESS) 72 MCG capsule Take 1 capsule (72 mcg total) by mouth daily before breakfast.  . metolazone (ZAROXOLYN) 2.5 MG tablet TAKE 1 TABLET BY MOUTH  DAILY AS NEEDED FOR 2 DAYS FOR WEIGHT ABOVE 255 POUNDS  . metoprolol succinate (TOPROL XL) 25 MG 24 hr tablet Take 3 tablets (75 mg total) by mouth daily.  [name] morphine (MS CONTIN) 15 MG 12 hr tablet Take 1 tablet (15 mg total) by mouth every 12 (twelve) hours.  [name] morphine (MSIR) 15 MG tablet One tablet twice a day as needed for break through pain.  . Multiple Vitamin (MULTIVITAMIN WITH MINERALS) TABS Take 1 tablet by mouth daily.   [name] olmesartan (BENICAR) 40 MG tablet Take 1 tablet (40 mg total) by mouth daily.  . OXYGEN-HELIUM IN Inhale 2 L into the lungs at bedtime. And on exertion  . pantoprazole (PROTONIX) 40 MG tablet take 1 tablet by mouth once daily MINUTES BEFORE 1ST MEAL OF THE DAY  . potassium chloride SA (KLOR-CON) 20 MEQ tablet TAKE 1 TABLET(20 MEQ) BY MOUTH DAILY  . predniSONE (DELTASONE) 5 MG tablet Take 10 mg by mouth every morning.   . Semaglutide,0.25 or 0.5MG/DOS, (OZEMPIC, 0.25 OR 0.5 MG/DOSE,) 2 MG/1.5ML SOPN Inject 0.5 mg into the skin once a week.  . sodium chloride (OCEAN) 0.65 % SOLN nasal spray Place 1 spray into both nostrils as needed for congestion.  [name] Spacer/Aero-Holding Chambers (AEROCHAMBER MV) inhaler Use as instructed   No facility-administered encounter medications on file as of 10/18/2020.    Allergies (verified) Codeine, Infliximab, and Lisinopril   History: Past Medical History:  Diagnosis Date  . Acute on chronic diastolic (congestive) heart failure ([place])   . Anemia    iron deficiency anemia - secondary to blood loss ( chronic)   . Arthritis    endstage changes bilateral knees/bilateral ankles.   . Asthma   . Carotid artery occlusion   . Chronic fatigue   . Chronic kidney disease   . Closed left hip fracture ([place])   . Clotting disorder ([place])    pt denies this  . Contusion of left knee    due to fall 1/14.  [name] COPD (chronic obstructive pulmonary disease) (H[facility])    pulmonary fibrosis  . Depression, reactive   . Diabetes mellitus    type II   .  Diastolic dysfunction   . Difficulty in walking   . Family history of heart disease   . Generalized muscle weakness   . Gout   . High cholesterol   . History of falling   . Hypertension   . Hypothyroidism   . Interstitial lung disease ([place])   . 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    . Morbid obesity ([place])   . Morbid obesity ([place])   . Neuromuscular disorder ([place])    diabetic neuropathy   . Normal coronary arteries    cardiac catheterization performed  10/31/14  . RA (rheumatoid  arthritis) ([place])    has been off methotreaxte since 10/13.  [name] Rheumatoid arthritis ([place])   . Shortness of breath   . Spinal stenosis of lumbar region   . Thyroid disease   . Unspecified lack of coordination   . URI (upper respiratory infection)    Past Surgical History:  Procedure Laterality Date  . ABDOMINAL HYSTERECTOMY    . BRAIN SURGERY     Gamma knife 10/13. Needs repeat spring  '14  . CARDIAC CATHETERIZATION N/A 10/31/2014   Procedure: Right/Left Heart Cath and Coronary Angiography;  Surgeon: [name], MD;  Location: [facility];  Service: Cardiovascular;  Laterality: N/A;  . ESOPHAGOGASTRODUODENOSCOPY (EGD) WITH PROPOFOL N/A 09/14/2014   Procedure: ESOPHAGOGASTRODUODENOSCOPY (EGD) WITH PROPOFOL;  Surgeon: [name], MD;  Location: [facility];  Service: Endoscopy;  Laterality: N/A;  . INCISION AND DRAINAGE HIP Left 01/16/2017   Procedure: IRRIGATION AND DEBRIDEMENT LEFT HIP;  Surgeon: [name], MD;  Location: [facility];  Service: Orthopedics;  Laterality: Left;  . INTRAMEDULLARY (IM) NAIL INTERTROCHANTERIC Left 11/29/2016   Procedure: INTRAMEDULLARY (IM) NAIL INTERTROCHANTRIC;  Surgeon: [name], MD;  Location: [facility];  Service: Orthopedics;  Laterality: Left;  . OVARY SURGERY    . SHOULDER SURGERY Left   . TONSILLECTOMY  age 84  . VIDEO BRONCHOSCOPY  Bilateral 05/31/2013   Procedure: VIDEO BRONCHOSCOPY WITHOUT FLUORO;  Surgeon: Brand Males, MD;  Location: [facility];  Service: Cardiopulmonary;  Laterality: Bilateral;  . video bronscoscopy  2000   lung   Family History  Problem Relation Age of Onset  . Diabetes Mother   . Heart attack Mother   . Hypertension Father   . Lung cancer Father   . Diabetes Sister   . Diabetes Brother   . Hypertension Brother   . Heart disease Brother   . Heart attack Brother   . Kidney cancer Brother   . Uterine cancer Daughter   . Breast cancer Sister   . Rheum arthritis Maternal Uncle   . Gout Brother   . Kidney failure Brother        x 5   Social History   Socioeconomic History  . Marital status: Married    Spouse name: Not on file  . Number of children: 6  . Years of education: college  . Highest education level: Not on file  Occupational History  . Occupation: retired Marine scientist  Tobacco Use  . Smoking status: Never Smoker  . Smokeless tobacco: Never Used  Vaping Use  . Vaping Use: Never used  Substance and Sexual Activity  . Alcohol use: No  . Drug use: No  . Sexual activity: Not Currently  Other Topics Concern  . Not on file  Social History Narrative   Patient consumes 2-3 cups coffee per day.   Social Determinants of Health   Financial Resource Strain: Not on file  Food Insecurity: Not on file  Transportation Needs: Not on file  Physical Activity: Not on file  Stress: Not on file  Social Connections: Not on file    Tobacco Counseling Counseling given: Not Answered   Clinical Intake:                 Diabetic?yes Nutrition Risk Assessment:  Has the patient had any N/V/D within the last 2 months?  No  Does the patient have any non-healing wounds?  No  Has the patient had any unintentional weight loss or weight gain?  No   Diabetes:  Is the patient diabetic?  Yes  If diabetic, was a CBG obtained today?  No  Did the patient bring in their  glucometer from home?  No  How often do you monitor your CBG's? 2-3 times a week   Financial Strains and Diabetes Management:  Are you having any financial strains with the device, your supplies or your medication? No .  Does the patient want to be seen by Chronic Care Management for management of their diabetes?  No  Would the patient like to be referred to a Nutritionist or for Diabetic Management?  No   Diabetic Exams:  Diabetic Eye Exam: Completed 11/13/2020 Diabetic Foot Exam: Overdue, Pt has been advised about the importance in completing this exam. Pt is scheduled for diabetic foot exam on next office visit .          Activities of Daily Living No flowsheet data found.  Patient Care Team: [name], MD as PCP - General (Family Medicine) Lorretta Harp, MD as PCP - Cardiology (Cardiology) Duffy, Creola Corn, LCSW as Social Worker (Licensed Clinical Social Worker) Kidney, Nelida Meuse, [place], MD as Consulting Physician (Rheumatology) Efraim Kaufmann, NP (Rheumatology) Theodosia Blender, MD as Referring Physician (Pulmonary Disease)  Indicate any recent Medical Services you may have received from other than [facility] providers in the past year (date may be approximate).     Assessment:   This is a routine wellness examination for [place].  Hearing/Vision screen No exam data present  Dietary issues and exercise activities discussed:    Goals Addressed   None    Depression Screen PHQ 2/9 Scores 09/24/2020 12/14/2019 09/22/2019 06/11/2018 05/26/2018 12/09/2017 10/22/2017  PHQ - 2 Score _0 0  PHQ- 9 Score - - _1 - -    Fall Risk Fall Risk  09/24/2020 01/25/2020 12/14/2019 10/19/2019 09/22/2019  Falls in the past year? 1 0 0 0 0  Comment Last fall in March 2022. - - - -  Number falls in past yr: 0 - 0 - -  Comment - - - - -  Injury with Fall? 0 - 0 - -  Comment - - - - -  Risk Factor Category  - - - - -  Risk for fall due to : - - - - -  Risk for  fall due to: Comment - - - - -  Follow up - - - - -  Comment - - - - -    FALL RISK PREVENTION PERTAINING TO THE HOME:  Any stairs in or around the home? Yes  If so, are there any without handrails? No  Home free of loose throw rugs in walkways, pet beds, electrical cords, etc? Yes  Adequate lighting in your home to reduce risk of falls? Yes   ASSISTIVE DEVICES UTILIZED TO PREVENT FALLS:  Life alert? No  Use of a cane, walker or w/c? Yes  Grab bars in the bathroom? Yes  Shower chair or bench in shower? Yes  Elevated toilet seat or a handicapped toilet? Yes   Cognitive Function:   Normal cognitive status assessed by direct observation by this Nurse Health Advisor. No abnormalities found.        Immunizations Immunization History  Administered Date(s) Administered  . Fluad Quad(high Dose 65+) 02/02/2019  . Influenza Split 01/29/2012, 03/30/2013, 02/10/2014  . Influenza, High Dose Seasonal PF 02/16/2017, 02/11/2018, 01/31/2019, 02/27/2020  . Influenza,inj,Quad PF,6+ Mos 02/09/2015, 12/18/2015  . Influenza-Unspecified 02/09/2015, 12/18/2015  .  PFIZER(Purple Top)SARS-COV-2 Vaccination 07/17/2019, 08/14/2019, 02/27/2020  . PPD Test 04/23/2015  . Pneumococcal Conjugate-13 08/21/2014  . Pneumococcal Polysaccharide-23 02/17/2012  . Tdap 12/27/2015    TDAP status: Up to date  Flu Vaccine status: Up to date  Pneumococcal vaccine status: Up to date  Covid-19 vaccine status: Completed vaccines  Qualifies for Shingles Vaccine? Yes   Zostavax completed No   Shingrix Completed?: No.    Education has been provided regarding the importance of this vaccine. Patient has been advised to call insurance company to determine out of pocket expense if they have not yet received this vaccine. Advised may also receive vaccine at local pharmacy or Health Dept. Verbalized acceptance and understanding.  Screening Tests Health Maintenance  Topic Date Due  . Hepatitis C Screening  Never done   . Zoster Vaccines- Shingrix (1 of 2) Never done  . FOOT EXAM  05/23/2020  . INFLUENZA VACCINE  12/17/2020  . OPHTHALMOLOGY EXAM  03/15/2021  . HEMOGLOBIN A1C  03/29/2021  . TETANUS/TDAP  12/26/2025  . DEXA SCAN  Completed  . COVID-19 Vaccine  Completed  . PNA vac Low Risk Adult  Completed  . HPV VACCINES  Aged Out    Health Maintenance  Health Maintenance Due  Topic Date Due  . Hepatitis C Screening  Never done  . Zoster Vaccines- Shingrix (1 of 2) Never done  . FOOT EXAM  05/23/2020    Colorectal cancer screening: No longer required.   Mammogram status: No longer required due to age.  Bone Density status: Ordered 10/18/2020. Pt provided with contact info and advised to call to schedule appt.  Lung Cancer Screening: (Low Dose CT Chest recommended if Age 56-80 years, 30 pack-year currently smoking OR have quit w/in 15years.) does not qualify.   Lung Cancer Screening Referral: n/a  Additional Screening:  Hepatitis C Screening: does qualify  Vision Screening: Recommended annual ophthalmology exams for early detection of glaucoma and other disorders of the eye. Is the patient up to date with their annual eye exam?  Yes  Who is the provider or what is the name of the office in which the patient attends annual eye exams? Dr.Lyles  If pt is not established with a provider, would they like to be referred to a provider to establish care? Yes .   Dental Screening: Recommended annual dental exams for proper oral hygiene  Community Resource Referral / Chronic Care Management: CRR required this visit?  No   CCM required this visit?  No      Plan:     I have personally reviewed and noted the following in the patient's chart:   . Medical and social history . Use of alcohol, tobacco or illicit drugs  . Current medications and supplements including opioid prescriptions.  . Functional ability and status . Nutritional status . Physical activity . Advanced directives . List  of other physicians . Hospitalizations, surgeries, and ER visits in previous 12 months . Vitals . Screenings to include cognitive, depression, and falls . Referrals and appointments  In addition, I have reviewed and discussed with patient certain preventive protocols, quality metrics, and best practice recommendations. A written personalized care plan for preventive services as well as general preventive health recommendations were provided to patient.     Randel Pigg, LPN   12/17/2749   Nurse Notes: none

## 2020-10-22 ENCOUNTER — Other Ambulatory Visit: Payer: Self-pay

## 2020-10-22 ENCOUNTER — Ambulatory Visit (INDEPENDENT_AMBULATORY_CARE_PROVIDER_SITE_OTHER): Payer: Medicare Other

## 2020-10-22 ENCOUNTER — Encounter: Payer: Self-pay | Admitting: Primary Care

## 2020-10-22 ENCOUNTER — Ambulatory Visit (INDEPENDENT_AMBULATORY_CARE_PROVIDER_SITE_OTHER): Payer: Medicare Other | Admitting: Primary Care

## 2020-10-22 VITALS — BP 118/76 | HR 99 | Temp 98.1°F | Ht 64.0 in | Wt 233.8 lb

## 2020-10-22 DIAGNOSIS — J8410 Pulmonary fibrosis, unspecified: Secondary | ICD-10-CM | POA: Diagnosis not present

## 2020-10-22 DIAGNOSIS — J9611 Chronic respiratory failure with hypoxia: Secondary | ICD-10-CM | POA: Diagnosis not present

## 2020-10-22 DIAGNOSIS — J479 Bronchiectasis, uncomplicated: Secondary | ICD-10-CM

## 2020-10-22 DIAGNOSIS — R079 Chest pain, unspecified: Secondary | ICD-10-CM | POA: Diagnosis not present

## 2020-10-22 NOTE — Patient Instructions (Addendum)
Recommendations: Call Dr. Pearline Cables office for azithromycin refill (if you can not get a hold of them I will refill 1 week supply) Continue Prednisone 69m daily  Use albuterol 2 puffs every 6 hours for breakthrough shortness of breath/wheezing  Recommend you wear _______2L_____ on exertion and continue to use 2L oxygen at night   Orders: Qualifying O2 walk for POC (patient would like to use [employer]) CXR today (ordered)  Follow-up: 3 months with Dr. RChase Caller

## 2020-10-22 NOTE — Assessment & Plan Note (Addendum)
-  Originally qualified for oxygen back in March 2022 but patient would like to change oxygen companies from [employer] to [employer]. She desaturated to 86% on RA p 1 lap. She re-qualified for oxygen today. She needs to use 2L oxygen with exertion and at night. Order for POC sent.

## 2020-10-22 NOTE — Progress Notes (Signed)
Please let patient know CXR showed slight increase in opacity right lung base, questionable infiltrate. Since that is where her pain is we will treat her for suspected pneumonia. Please send in Augmentin 1 tab twice daily x 10 days. Hold azithromycin until completed.

## 2020-10-22 NOTE — Progress Notes (Addendum)
_0  ID: [name], female    DOB: [birth year], 80 y.o.   MRN: [id number]  Chief Complaint  Patient presents with   Follow-up    Needs a new order for oxygen.     Referring provider: [name], MD  HPI: 80 year old female, never smoked. PMH significant for ILD, chronic respiratory failure on nocturnal oxygen, bronchiectasis, rheumatoid arthritis, HTN, diastolic heart failure, GERD, type 2 diabetes, CKD stage 3, chronic back pain, hx DVT in Jan 2020. Patient of [name], last seen by pulmonary NP on 07/30/20. She follows with rheumatology and pulmonary at [facility].    Previous [facility] pulmonary encounter: 07/30/2020  Patient returns for a 1 year follow-up.  She is followed at [facility] pulmonary for her bronchiectasis and pulmonary fibrosis.  She is on flutter valve, nebulizer therapy.  And 3 days a week azithromycin.  Says overall she has not had a flare of cough or congestion.  No recent antibiotics or hospitalizations for her breathing. Patient does get short of breath with minimum activity.  She is sedentary.  Uses a walker at home.  Does chair exercises but is very limited by her joint pain and breathing issues.  But says overall over the last year she has been better than her usual.   Has rheumatoid arthritis followed by [facility] rheumatology -on chronic steroids prednisone 10 mg daily, Leflunomide , and Plaquenil.  Has chronic pain on chronic narcotics.   Remains on Oxygen 2l/m At bedtime  . Says oxygen does drop with walking but does not have a portable system. Walk test in office shows oxygen level at 87% walking on room air.  Required 2 L of oxygen to maintain sats greater than 88 to 90%.   Patient is followed by cardiology for diastolic heart failure and chronic edema.  She is on Lasix and metolazone.  She says her leg swelling has been doing better but has daily leg swelling always.   10/22/2020- Interim hx  Patient presents today for qualifying O2 walk. She  wears 2L oxygen at bedtime. In March she was noted to have mild oxygen desaturations to 87% on RA, recommended to wear 2L with activity and order for POC sent to [employer] [facility]. She has been with them longer than 5 years. She spoke with medicare and switching companied should not be an issue on their end. Patient would like to use [facility] for new oxygen. Needs walk test today.    She is feeling ok. She has had some pain to rib cage on her right side for 2 weeks. She has a chronic cough which is better. Mucus is white-clear color. States that it is "not as bad as it was". She has morning congestion. She takes mucinex twice a day and uses flutter valve which she uses 4 times a day. Dr. Pearline Cables had wanted patient on Azithromycin MWF for bronchiextsis. She needs refill, has one day left of medication.  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 Needs CXR on return visit.   TEST/EVENTS : High-resolution CT chest November 24, 2018 mid to lower lung bronchiectasis, groundglass reticular densities stable since 2016    Allergies  Allergen Reactions   Codeine Swelling and Other (See Comments)    Facial swelling Chest pain and swelling in legs  Facial swelling   Infliximab Anaphylaxis    "sent me into shock" "sent me into shock"   Lisinopril Swelling    Face and neck swelling Face and neck swelling    Immunization  History  Administered Date(s) Administered   Fluad Quad(high Dose 65+) 02/02/2019   Influenza Split 01/29/2012, 03/30/2013, 02/10/2014   Influenza, High Dose Seasonal PF 02/16/2017, 02/11/2018, 01/31/2019, 02/27/2020   Influenza,inj,Quad PF,6+ Mos 02/09/2015, 12/18/2015   Influenza-Unspecified 02/09/2015, 12/18/2015   PFIZER(Purple Top)SARS-COV-2 Vaccination 07/17/2019, 08/14/2019, 02/27/2020   PPD Test 04/23/2015   Pneumococcal Conjugate-13 08/21/2014   Pneumococcal Polysaccharide-23 02/17/2012   Tdap 12/27/2015    Past Medical History:  Diagnosis Date   Acute on  chronic diastolic (congestive) heart failure (HCC)    Anemia    iron deficiency anemia - secondary to blood loss ( chronic)    Arthritis    endstage changes bilateral knees/bilateral ankles.    Asthma    Carotid artery occlusion    Chronic fatigue    Chronic kidney disease    Closed left hip fracture (HCC)    Clotting disorder ([place])    pt denies this   Contusion of left knee    due to fall 1/14.   COPD (chronic obstructive pulmonary disease) (HCC)    pulmonary fibrosis   Depression, reactive    Diabetes mellitus    type II    Diastolic dysfunction    Difficulty in walking    Family history of heart disease    Generalized muscle weakness    Gout    High cholesterol    History of falling    Hypertension    Hypothyroidism    Interstitial lung disease ([place])    Meningioma of left sphenoid wing involving cavernous sinus ([place]) 02/17/2012   Continue diplopia, left eye pain and left headaches.     Morbid obesity ([place])    Morbid obesity ([place])    Neuromuscular disorder ([place])    diabetic neuropathy    Normal coronary arteries    cardiac catheterization performed  10/31/14   RA (rheumatoid arthritis) ([place])    has been off methotreaxte since 10/13.   Rheumatoid arthritis (HCC)    Shortness of breath    Spinal stenosis of lumbar region    Thyroid disease    Unspecified lack of coordination    URI (upper respiratory infection)     Tobacco History: Social History   Tobacco Use  Smoking Status Never Smoker  Smokeless Tobacco Never Used   Counseling given: Not Answered   Outpatient Medications Prior to Visit  Medication Sig Dispense Refill   acetaminophen (TYLENOL) 500 MG tablet Take 1,000 mg by mouth every 6 (six) hours as needed for moderate pain.      albuterol (PROVENTIL HFA;VENTOLIN HFA) 108 (90 BASE) MCG/ACT inhaler Inhale 2 puffs into the lungs every 6 (six) hours as needed for wheezing or shortness of breath.     allopurinol (ZYLOPRIM) 300 MG tablet Take 300 mg by  mouth daily.     aspirin buffered (BUFFERIN) 325 MG TABS tablet Take 325 mg by mouth daily.     atorvastatin (LIPITOR) 20 MG tablet Take 0.5 tablets (10 mg total) by mouth every evening. 90 tablet 2   azithromycin (ZITHROMAX) 250 MG tablet Take 2 tabs on day one.  Then take 1 tab daily on days 2-7. (Patient taking differently: Take 2 tabs on day one.  Then take 1 tab daily on days 2-7.) 8 tablet 0   Calcium Carb-Cholecalciferol (CALCIUM + D3 PO) Take 2 tablets by mouth daily with lunch.      cholecalciferol (VITAMIN D) 1000 UNITS tablet Take 2,000 Units by mouth daily with lunch.      colchicine  0.6 MG tablet Take 0.6 mg by mouth daily.     cyclobenzaprine (FLEXERIL) 5 MG tablet Take 1 tablet (5 mg total) by mouth at bedtime as needed for muscle spasms. 90 tablet 3   diclofenac sodium (VOLTAREN) 1 % GEL Apply 2 g topically 3 (three) times daily. Both hands and knees 300 g 3   DULoxetine (CYMBALTA) 60 MG capsule Take 1 capsule (60 mg total) by mouth daily. 10 capsule 0   fluticasone (FLONASE) 50 MCG/ACT nasal spray Place 2 sprays into both nostrils daily. 16 g 3   furosemide (LASIX) 80 MG tablet Take 80 mg by mouth.     gabapentin (NEURONTIN) 600 MG tablet Take two in the morning , Two mid- afternoon and two at bedtime 540 tablet 3   guaiFENesin (MUCINEX) 600 MG 12 hr tablet Take 1,200 mg by mouth 2 (two) times daily.     hydroxychloroquine (PLAQUENIL) 200 MG tablet Take 200 mg by mouth daily.     ipratropium-albuterol (DUONEB) 0.5-2.5 (3) MG/3ML SOLN Take 3 mLs by nebulization every 6 (six) hours as needed (cough/wheezing).      leflunomide (ARAVA) 10 MG tablet Take 2 tablets (20 mg total) by mouth daily.     leflunomide (ARAVA) 20 MG tablet Take by mouth.     levothyroxine (SYNTHROID) 50 MCG tablet Take 1 tablet (50 mcg total) by mouth daily. 90 tablet 2   linaclotide (LINZESS) 72 MCG capsule Take 1 capsule (72 mcg total) by mouth daily before breakfast. 90 capsule 1   metolazone (ZAROXOLYN) 2.5  MG tablet TAKE 1 TABLET BY MOUTH DAILY AS NEEDED FOR 2 DAYS FOR WEIGHT ABOVE 255 POUNDS 90 tablet 1   metoprolol succinate (TOPROL XL) 25 MG 24 hr tablet Take 3 tablets (75 mg total) by mouth daily. 270 tablet 3   morphine (MS CONTIN) 15 MG 12 hr tablet Take 1 tablet (15 mg total) by mouth every 12 (twelve) hours. 60 tablet 0   morphine (MSIR) 15 MG tablet One tablet twice a day as needed for break through pain. 60 tablet 0   Multiple Vitamin (MULTIVITAMIN WITH MINERALS) TABS Take 1 tablet by mouth daily.      olmesartan (BENICAR) 40 MG tablet Take 1 tablet (40 mg total) by mouth daily. 90 tablet 2   OXYGEN-HELIUM IN Inhale 2 L into the lungs at bedtime. And on exertion     pantoprazole (PROTONIX) 40 MG tablet take 1 tablet by mouth once daily MINUTES BEFORE 1ST MEAL OF THE DAY 90 tablet 2   potassium chloride SA (KLOR-CON) 20 MEQ tablet TAKE 1 TABLET(20 MEQ) BY MOUTH DAILY 90 tablet 0   predniSONE (DELTASONE) 5 MG tablet Take 10 mg by mouth every morning.   0   Semaglutide,0.25 or 0.5MG/DOS, (OZEMPIC, 0.25 OR 0.5 MG/DOSE,) 2 MG/1.5ML SOPN Inject 0.5 mg into the skin once a week. 3 mL 5   sodium chloride (OCEAN) 0.65 % SOLN nasal spray Place 1 spray into both nostrils as needed for congestion.     Spacer/Aero-Holding Chambers (AEROCHAMBER MV) inhaler Use as instructed 1 each 0   No facility-administered medications prior to visit.   Review of Systems  Review of Systems  Constitutional: Negative.   Respiratory: Positive for cough.        Right sided pleuritic pain   Cardiovascular: Negative.    Physical Exam  BP 118/76 (BP Location: Left Arm, Cuff Size: Normal)   Pulse 99   Temp 98.1 F (36.7 C) (Temporal)  Ht _0  (1.626 m)   Wt 233 lb 12.8 oz (106.1 kg)   SpO2 92% Comment: RA  BMI 40.13 kg/m  Physical Exam Constitutional:      Appearance: Normal appearance.  HENT:     Head: Normocephalic and atraumatic.     Mouth/Throat:     Mouth: Mucous membranes are moist.     Pharynx:  Oropharynx is clear.  Cardiovascular:     Rate and Rhythm: Normal rate and regular rhythm.     Comments: +2 BLE edema Pulmonary:     Effort: Pulmonary effort is normal.     Breath sounds: Normal breath sounds.     Comments: Rhonchi bilateral lower lobes Musculoskeletal:        General: Normal range of motion.  Skin:    General: Skin is warm and dry.  Neurological:     General: No focal deficit present.     Mental Status: She is alert and oriented to person, place, and time. Mental status is at baseline.  Psychiatric:        Mood and Affect: Mood normal.        Behavior: Behavior normal.        Thought Content: Thought content normal.        Judgment: Judgment normal.      Lab Results:  CBC    Component Value Date/Time   WBC 8.5 09/26/2020 1355   RBC 4.01 09/26/2020 1355   HGB 12.1 09/26/2020 1355   HGB 11.7 02/22/2018 1256   HGB 12.4 02/17/2012 1337   HCT 37.5 09/26/2020 1355   HCT 35.9 02/22/2018 1256   HCT 37.2 02/17/2012 1337   PLT 153.0 09/26/2020 1355   PLT 205 02/22/2018 1256   MCV 93.5 09/26/2020 1355   MCV 92 02/22/2018 1256   MCV 94.1 02/17/2012 1337   MCH 30.8 11/10/2019 2052   MCHC 32.2 09/26/2020 1355   RDW 15.5 09/26/2020 1355   RDW 14.0 02/22/2018 1256   RDW 16.0 (H) 02/17/2012 1337   LYMPHSABS 3.1 09/26/2020 1355   LYMPHSABS 1.2 02/17/2012 1337   MONOABS 0.9 09/26/2020 1355   MONOABS 0.4 02/17/2012 1337   EOSABS 0.4 09/26/2020 1355   EOSABS 0.3 02/17/2012 1337   BASOSABS 0.1 09/26/2020 1355   BASOSABS 0.0 02/17/2012 1337    BMET    Component Value Date/Time   NA 144 09/26/2020 1355   NA 149 (A) 08/01/2020 0000   NA 139 02/17/2012 1337   K 3.5 09/26/2020 1355   K 4.8 02/17/2012 1337   CL 101 09/26/2020 1355   CL 103 02/17/2012 1337   CO2 36 (H) 09/26/2020 1355   CO2 25 02/17/2012 1337   GLUCOSE 108 (H) 09/26/2020 1355   GLUCOSE 112 (H) 02/17/2012 1337   BUN 20 09/26/2020 1355   BUN 23 (A) 08/01/2020 0000   BUN 32.0 (H) 02/17/2012  1337   CREATININE 0.97 09/26/2020 1355   CREATININE 1.57 (H) 09/17/2016 1438   CREATININE 1.7 (H) 02/17/2012 1337   CALCIUM 8.7 09/26/2020 1355   CALCIUM 8.9 02/17/2012 1337   GFRNONAA 43 (L) 11/10/2019 2052   GFRNONAA 32 (L) 09/17/2016 1438   GFRAA 49 (L) 11/10/2019 2052   GFRAA 37 (L) 09/17/2016 1438    BNP    Component Value Date/Time   BNP 25.9 04/27/2019 0336    ProBNP    Component Value Date/Time   PROBNP 18.0 06/06/2014 1250    Imaging: DG Chest 2 View  Result Date: 10/22/2020 CLINICAL DATA:  Bronchiectasis, follow-up, pleuritic RIGHT chest pain EXAM: CHEST - 2 VIEW COMPARISON:  04/27/2019 FINDINGS: Upper normal heart size. Mediastinal contours and pulmonary vascularity normal. Chronic bronchitic and interstitial changes. Lower lobe bronchiectasis better identified on prior CT. Subsegmental atelectasis versus infiltrate at RIGHT base. Remaining lungs clear. No pleural effusion or pneumothorax. Osseous demineralization. IMPRESSION: Chronic bronchitic and interstitial changes with known basilar bronchiectasis. Slightly increased opacity at RIGHT lung base question atelectasis versus infiltrate. Electronically Signed   By: Lavonia Dana M.D.   On: 10/22/2020 11:11     Assessment & Plan:   Pulmonary fibrosis, postinflammatory (HCC) - Stable on previous scans, patient reports new right sided pleuritic pain x 2 weeks. Currently on Azithromycin MWF for bronchiectasis and 44m prednisone daily. She will need to [facility] BPearline Cableswith [facility] for refill, if she is not able to connect with them I can send in refill for her.  She is complaint with Mucinex and flutter valve. Needs CXR today. FU in 3 months with Dr. RChase Calleror sooner if needed.  Chronic respiratory failure with hypoxia ([facility] -Originally qualified for oxygen back in March 2022 but patient would like to change oxygen companies [facility] [employer] [place] She desaturated to 86% on RA p 1 lap. She re-qualified for oxygen  today. She needs to use 2L oxygen with exertion and at night. Order for POC sent.     1) RISK FOR PROLONGED MECHANICAL VENTILAION - > 48h  1A) Arozullah - Prolonged mech ventilation risk Arozullah Postperative Pulmonary Risk Score - for mech ventilation dependence >48h ([employer] Ann Surg 2000, major non-cardiac surgery) Comment Score  Type of surgery - abd ao aneurysm (27), thoracic (21), neurosurgery / upper abdominal / vascular (21), neck (11) Cataract surgery 0  Emergency Surgery - (11)  0  ALbumin < 3 or poor nutritional state - (9)  0  BUN > 30 -  (8)  0  Partial or completely dependent functional status - (7)  0  COPD -  (6) ILD/ resp failure 6  Age - 60 to 644(4), > 70  (6)  6  TOTAL  12  Risk Stratifcation scores  - < 10 (0.5%), 11-19 (1.8%), 20-27 (4.2%), 28-40 (10.1%), >40 (26.6%)  1.8%      1B) GUPTA - Prolonged Mech Vent Risk Score source Risk  Guptal post op prolonged mech ventilation > 48h or reintubation < 30 days - ACS 2007-2008 dataset - hhttp://lewis-perez.info/0.4 % Risk of mechanical ventilation for >48 hrs after surgery, or unplanned intubation ?30 days of surgery    2) RISK FOR POST OP PNEUMONIA Score source Risk  GLyndel Safe- Post Op Pnemounia risk  hTonerProviders.co.za0.6 % Risk of postoperative pneumonia    R3) ISK FOR ANY POST-OP PULMONARY COMPLICATION Score source Risk  CANET/ARISCAT Score - risk for ANY/ALl pulmonary complications - > risk of in-hospital post-op pulmonary complications (composite including respiratory failure, respiratory infection, pleural effusion, atelectasis, pneumothorax, bronchospasm, aspiration pneumonitis) hSocietyMagazines.ca- based on age, anemia, pulse ox, resp infection prior 30d, incision site, duration of surgery, and emergency v elective surgery Intermediate risk 13.3% risk of  in-hospital post-op pulmonary complications (composite including respiratory failure, respiratory infection, pleural effusion, atelectasis, pneumothorax, bronchospasm, aspiration pneumonitis)      EMartyn Ehrich NP 10/22/2020

## 2020-10-22 NOTE — Assessment & Plan Note (Addendum)
-  Stable on previous scans, patient reports new right sided pleuritic pain x 2 weeks. Currently on Azithromycin MWF for bronchiectasis and 46m prednisone daily. She will need to contact Dr. BP[facility]line Cableswith [facility] for refill, if she is not able to connect with them I can send in refill for her.  She is complaint with Mucinex and flutter valve. Needs CXR today. FU in 3 months with Dr. RChase Calleror sooner if needed.

## 2020-10-23 ENCOUNTER — Telehealth: Payer: Self-pay | Admitting: Primary Care

## 2020-10-23 DIAGNOSIS — J9611 Chronic respiratory failure with hypoxia: Secondary | ICD-10-CM

## 2020-10-23 DIAGNOSIS — J479 Bronchiectasis, uncomplicated: Secondary | ICD-10-CM

## 2020-10-23 DIAGNOSIS — R0600 Dyspnea, unspecified: Secondary | ICD-10-CM

## 2020-10-23 DIAGNOSIS — R0609 Other forms of dyspnea: Secondary | ICD-10-CM

## 2020-10-23 NOTE — Telephone Encounter (Signed)
Called and spoke with [name] at [facility] and she stated that the order has to say for oxygen concentrator and POC.   They cannot order one without the other.  This new order has been sent in and nothing further is needed.

## 2020-10-24 ENCOUNTER — Telehealth: Payer: Self-pay | Admitting: Primary Care

## 2020-10-24 ENCOUNTER — Telehealth: Payer: Self-pay | Admitting: *Deleted

## 2020-10-24 DIAGNOSIS — J9611 Chronic respiratory failure with hypoxia: Secondary | ICD-10-CM

## 2020-10-24 DIAGNOSIS — J479 Bronchiectasis, uncomplicated: Secondary | ICD-10-CM

## 2020-10-24 DIAGNOSIS — J8410 Pulmonary fibrosis, unspecified: Secondary | ICD-10-CM

## 2020-10-24 DIAGNOSIS — R0600 Dyspnea, unspecified: Secondary | ICD-10-CM

## 2020-10-24 DIAGNOSIS — R0609 Other forms of dyspnea: Secondary | ICD-10-CM

## 2020-10-24 MED ORDER — AMOXICILLIN-POT CLAVULANATE 875-125 MG PO TABS: 1.0000 | ORAL_TABLET | Freq: Two times a day (BID) | ORAL | 0 refills | Status: DC

## 2020-10-24 NOTE — Telephone Encounter (Signed)
Please let patient know CXR showed slight increase in opacity right lung base, questionable infiltrate. Since that is where her pain is we will treat her for suspected pneumonia. Please send in Augmentin 1 tab twice daily x 10 days. Hold azithromycinuntil completed.    I called and spoke with pt about her results and she is aware.  She is aware of augmentin that has been sent to her pharmacy and that she will hold the azithromycin until that has been completed.

## 2020-10-24 NOTE — Telephone Encounter (Signed)
Called and spoke with Estill Bamberg from [facility] about order that had been placed 6/6 as pt is switching from [employer] to [place]. Order needs to state continuous with stationary concentrator and portable oxygen concentrator.  New order has been placed. Nothing further needed.

## 2020-10-30 ENCOUNTER — Telehealth: Payer: Self-pay | Admitting: Primary Care

## 2020-10-30 NOTE — Telephone Encounter (Signed)
I called and spoke with patient regarding O2 order. I notified patient that correct O2 order was sent to [facility] on 10/24/20 so they should be reaching out to patient next. Patient verbalized understanding, nothing further needed.

## 2020-10-31 ENCOUNTER — Telehealth: Payer: Self-pay | Admitting: Primary Care

## 2020-10-31 DIAGNOSIS — J479 Bronchiectasis, uncomplicated: Secondary | ICD-10-CM

## 2020-10-31 NOTE — Telephone Encounter (Signed)
Order has been placed 3 times in the last 2 weeks. Checked with Vallarie Mare to see if she has received anything from [facility] for this patient.   Left message for Ashly to call back.

## 2020-11-01 ENCOUNTER — Encounter: Payer: Medicare Other | Admitting: Family Medicine

## 2020-11-01 NOTE — Telephone Encounter (Signed)
Called [employer] and spoke to [place].  He states they do have poc order and that is in process.  What he was calling about was order for vest he dropped off for Beth to sign.  He stated he checked pt's chart & stated that he thought pt would benefit from vest at no cost to her and he spoke to [place] and Leigh checked with [facility] and she agreed to it so he dropped off order to be signed.  Three is not an order for a vest in pt's chart so I told him someone would need to check on this.

## 2020-11-01 NOTE — Telephone Encounter (Signed)
Beth, please advise if you are ok with vest for this patient.

## 2020-11-01 NOTE — Telephone Encounter (Signed)
Msg being forwarded to [facility] per protocol since orders already placed.

## 2020-11-02 NOTE — Telephone Encounter (Signed)
We can put order in for vest [id number]

## 2020-11-02 NOTE — Telephone Encounter (Signed)
Order has been placed.

## 2020-11-06 ENCOUNTER — Telehealth: Payer: Self-pay | Admitting: Primary Care

## 2020-11-06 NOTE — Telephone Encounter (Signed)
Surgical clearance fax received on pt from [facility]. Pt will be having cataract removal of both eyes.  The form states for pulmonary clearance, they need to have most recent information sent to them that we can including any pulmonary testing results, chest xray results, office notes, and/or history and physical.  Pt did have recent cxr and OV with Beth 6/6 so we can send that info to the surgical center.  Beth, can you please do a risk assessment on last OV pt had with you and send this back to me once completed so I can get all faxed for pt. Thanks!

## 2020-11-07 ENCOUNTER — Other Ambulatory Visit: Payer: Self-pay

## 2020-11-07 ENCOUNTER — Other Ambulatory Visit: Payer: Medicare Other | Admitting: Nurse Practitioner

## 2020-11-07 ENCOUNTER — Encounter: Payer: Self-pay | Admitting: Nurse Practitioner

## 2020-11-07 DIAGNOSIS — R0602 Shortness of breath: Secondary | ICD-10-CM

## 2020-11-07 DIAGNOSIS — Z515 Encounter for palliative care: Secondary | ICD-10-CM | POA: Diagnosis not present

## 2020-11-07 DIAGNOSIS — R5381 Other malaise: Secondary | ICD-10-CM

## 2020-11-07 NOTE — Progress Notes (Signed)
[facility] Consult Note Telephone: [phone]  Fax: [phone]    Date of encounter: 11/07/20 PATIENT NAME: [name] [address] [place] [address]   [id number] (home)  DOB: [birth year] MRN: [id number] PRIMARY CARE PROVIDER:    [name], MD,  [place] [address] [phone]  RESPONSIBLE PARTY:    Contact Information     Name Relation Home Work Mobile   Farrow,Curtis Son [phone]  [phone]   Linna Darner [phone]  [phone]   Breck, Hollinger Spouse [phone]  [phone]   Shelly Coss Daughter [phone]     Jerelyn Scott Daughter   [phone]      Due to the COVID-19 crisis, this visit was done via telemedicine from my office and it was initiated and consent by this patient and or family.  I connected with  [name] on 11/07/20 by a video enabled telemedicine application and verified that I am speaking with the correct.   I discussed the limitations of evaluation and management by telemedicine. The patient expressed understanding and agreed to proceed. . Palliative Care was asked to follow this patient by consultation request of  [name], MD to address advance care planning and complex medical decision making. This is a follow up visit. ASSESSMENT AND PLAN / RECOMMENDATIONS: Symptom Management/Plan: 1. ACP: full code currently with aggressive interventions.   2. Debility secondary to RA; continue current regimen, pain management; encourage mobility with safety, fall risk.    3. Palliative care encounter Palliative medicine team will continue to support patient, patient's family, and medical team. Visit consisted of counseling and education dealing with the complex and emotionally intense issues of symptom management and palliative care in the setting of serious and potentially life-threatening    4. Shortness of breath  secondary to pulmonary fibrosis; CHF, COPD, continue to repeat walking testing to obtain O2 for home, inhalation therapy, monitor respiratory status, monitor edema, activity level, energy conservation  Follow up Palliative Care Visit: Palliative care will continue to follow for complex medical decision making, advance care planning, and clarification of goals. Return 6 weeks or prn.  I spent 36 minutes providing this consultation. More than 50% of the time in this consultation was spent in counseling and care coordination.  PPS: 50%  HOSPICE ELIGIBILITY/DIAGNOSIS: TBD  Chief Complaint: Follow up palliative consult for complex medical decision making  HISTORY OF PRESENT ILLNESS:  [name] is a 80 y.o. year old female with multiple medical problems including Chronic diastolic congestive heart failure, pulmonary fibrosis, interstitial lung disease, chronic respiratory failure with hypoxia, dysphasia, aphasia, carotid artery occlusion, COPD,  history of DVT, chronic kidney disease, Rheumatoid arthritis, anemia, meningioma of left sphenoid wing involving cavernous sinus, hypothyroidism, hypertension, gerd, spinal stenosis of lumbar region,  osteoarthritis, hypercholesterolemia, gout, morbid obesity, arthritis, cataract,  chronic pain, history of left hip fracture s/p nail, tonsillectomy, left shoulder surgery, ovarian surgery, brain surgery, abdominal hysterectomy. I called Ms. Prestia for telemedicine, telephonic as video not available. We talked about how Ms. Allaire has been feeling. Ms. Bihm endorses she is starting to feel better as she is getting over pneumonia with worse symptom pain in her side. We talked at length about shortness of breath, energy conservation. We talked about challenges Ms Mccaig has been facing trying to get her oxygen for which she has qualified for. Ms. Byington endorses she was able to get [employer] to supply her O2 although she is going to  have to repeat walking  qualifying testing which is scheduled since it has been some time since there has been a long delay trying to get an agency to supply her oxygen. We talked about symptoms of pain chronic. We talked about her appetite which has started improving since she has been sick with PNA. We talked about importance of rest, fatigue, weakness. Reviewed medical goals of care. We talked about quality of life, things she enjoys. We talked about f/u PC visit, Ms. Gambill in agreement, scheduled in person visit. Therapeutic listening, emotional support provided. Questions answered. Praised Ms. Dehne for continuing to persevere, advocate for herself with insurance navigating with MD's.    History obtained from review of EMR, discussion with Ms. Desena.  I reviewed available labs, medications, imaging, studies and related documents from the EMR.  Records reviewed and summarized above.   ROS Full 14 system review of systems performed and negative with exception of: as per HPI.   Physical Exam: Deferred  Questions and concerns were addressed. The patient was encouraged to call with questions and/or concerns. My contact information was provided. Provided general support and encouragement, no other unmet needs identified   Thank you for the opportunity to participate in the care of Ms. Darcey.  The palliative care team will continue to follow. Please call our office at [phone] if we can be of additional assistance.   This chart was dictated using voice recognition software.  Despite best efforts to proofread,  errors can occur which can change the documentation meaning.   Mclean Moya Ihor Gully, NP , MSN,, [facility]

## 2020-11-07 NOTE — Telephone Encounter (Signed)
Last OV stated to f/u in 3 months. Should we schedule a sooner f/u appt to reassess and also for the surgical clearance?

## 2020-11-07 NOTE — Telephone Encounter (Signed)
I would have her follow-up in 2 weeks with me to assess her improvement. I would hold off on surgery until she has recovered fully

## 2020-11-07 NOTE — Telephone Encounter (Signed)
She is an intermediate risk. We are currently treating her for suspected pneumonia.

## 2020-11-07 NOTE — Telephone Encounter (Signed)
Attempted to call pt but unable to reach. Left message for her to return call.  When pt returns call, please make f/u appt for pt in 2 weeks with Beth.

## 2020-11-08 ENCOUNTER — Telehealth: Payer: Self-pay | Admitting: Primary Care

## 2020-11-08 ENCOUNTER — Ambulatory Visit (INDEPENDENT_AMBULATORY_CARE_PROVIDER_SITE_OTHER): Payer: Medicare Other | Admitting: Family Medicine

## 2020-11-08 ENCOUNTER — Encounter: Payer: Self-pay | Admitting: Family Medicine

## 2020-11-08 VITALS — BP 110/70 | HR 100 | Temp 98.6°F

## 2020-11-08 DIAGNOSIS — E1140 Type 2 diabetes mellitus with diabetic neuropathy, unspecified: Secondary | ICD-10-CM

## 2020-11-08 DIAGNOSIS — J849 Interstitial pulmonary disease, unspecified: Secondary | ICD-10-CM

## 2020-11-08 DIAGNOSIS — I10 Essential (primary) hypertension: Secondary | ICD-10-CM | POA: Diagnosis not present

## 2020-11-08 DIAGNOSIS — H25013 Cortical age-related cataract, bilateral: Secondary | ICD-10-CM

## 2020-11-08 DIAGNOSIS — R600 Localized edema: Secondary | ICD-10-CM | POA: Diagnosis not present

## 2020-11-08 DIAGNOSIS — Z0000 Encounter for general adult medical examination without abnormal findings: Secondary | ICD-10-CM | POA: Diagnosis not present

## 2020-11-08 DIAGNOSIS — E785 Hyperlipidemia, unspecified: Secondary | ICD-10-CM

## 2020-11-08 DIAGNOSIS — I5032 Chronic diastolic (congestive) heart failure: Secondary | ICD-10-CM | POA: Diagnosis not present

## 2020-11-08 DIAGNOSIS — J189 Pneumonia, unspecified organism: Secondary | ICD-10-CM

## 2020-11-08 LAB — CBC WITH DIFFERENTIAL/PLATELET
Basophils Absolute: 0 10*3/uL (ref 0.0–0.1)
Basophils Relative: 0.5 % (ref 0.0–3.0)
Eosinophils Absolute: 0.2 10*3/uL (ref 0.0–0.7)
Eosinophils Relative: 2.2 % (ref 0.0–5.0)
HCT: 37.6 % (ref 36.0–46.0)
Hemoglobin: 12.1 g/dL (ref 12.0–15.0)
Lymphocytes Relative: 18.2 % (ref 12.0–46.0)
Lymphs Abs: 1.6 10*3/uL (ref 0.7–4.0)
MCHC: 32.2 g/dL (ref 30.0–36.0)
MCV: 94 fl (ref 78.0–100.0)
Monocytes Absolute: 0.7 10*3/uL (ref 0.1–1.0)
Monocytes Relative: 8.3 % (ref 3.0–12.0)
Neutro Abs: 6 10*3/uL (ref 1.4–7.7)
Neutrophils Relative %: 70.8 % (ref 43.0–77.0)
Platelets: 145 10*3/uL — ABNORMAL LOW (ref 150.0–400.0)
RBC: 4 Mil/uL (ref 3.87–5.11)
RDW: 15.3 % (ref 11.5–15.5)
WBC: 8.5 10*3/uL (ref 4.0–10.5)

## 2020-11-08 LAB — BASIC METABOLIC PANEL
BUN: 21 mg/dL (ref 6–23)
CO2: 34 mEq/L — ABNORMAL HIGH (ref 19–32)
Calcium: 8.7 mg/dL (ref 8.4–10.5)
Chloride: 103 mEq/L (ref 96–112)
Creatinine, Ser: 0.96 mg/dL (ref 0.40–1.20)
GFR: 56.49 mL/min — ABNORMAL LOW (ref >60.00)
Glucose, Bld: 124 mg/dL — ABNORMAL HIGH (ref 70–99)
Potassium: 3.6 mEq/L (ref 3.5–5.1)
Sodium: 145 mEq/L (ref 135–145)

## 2020-11-08 NOTE — Telephone Encounter (Addendum)
Attempted to call pt but unable to reach. Left message for pt to return call.  Am also routing this to front desk pool so they can help try to get pt scheduled for a f/u.    Pt needs to have f/u with Beth in 2 weeks.    Once able to have surgical clearance and once the risk assessment has been done by [facility], the OV notes including the risk assessment to [place] in attn: [place] Pre-Anesthesia Department to fax number [phone]

## 2020-11-08 NOTE — Telephone Encounter (Signed)
Fax received from [facility] Anesthesia Department to perform a procedure on patient.  Patient needs surgery clearance. Patient was just seen 10/22/20.. Office protocol is a risk assessment can be sent to surgeon if patient has been seen in 60 days or less.   Form is asking for any pertinent information regarding pulmonary clearance, xray notes, H&P, pulm tests.  Sending to Derl Barrow NP for risk assessment or recommendations if patient needs to be seen in office prior to surgical procedure.

## 2020-11-08 NOTE — Progress Notes (Signed)
Subjective:   [name] is a 80 y.o. female who was seen for f/u on chronic conditions.    Patient states she is doing okay overall.  Had AWV 10/18/20.  Recently diagnosed with pneumonia after having right side pain during a pulmonary test with new oxygen company.  Previously using [employer] but switched to [place] as she was unable to get a portable O2 tank.  Completed ABX x10 days.  Notes continued fatigue and SOB prior to diagnosis of pneumonia.  Has cataract surgery on the 28th.  Did not take Lasix this morning 2/2 having this appointment.  Notes improvement in LE edema.  Elevating LEs when sitting.  Using wheelchair for appointments, uses walker at home.  Endorses 1 fall several months ago while in her bedroom trying to bend down to pick something off the floor.  Denies injury.  Review of Systems    Increased SOB, fatigue, R side pain     Objective:    Today's Vitals   11/08/20 1027  BP: 110/70  Pulse: 100  Temp: 98.6 F (37 C)  TempSrc: Oral  SpO2: 95%   Gen. Pleasant, well developed, well-nourished, in NAD on 2 L HEENT:[place]/AT, PERRL, EOMI, conjunctive clear, no scleral icterus, no nasal drainage, pharynx without erythema or exudate. Lungs: no use of accessory muscles, CTAB, no wheezes, rales or rhonchi Cardiovascular:  tachycardia, no peripheral edema Abdomen: BS present, soft, nontender, nondistended Musculoskeletal: No deformities, moves all four extremities, no cyanosis or clubbing, normal tone Neuro:  A&Ox3, CN II-XII intact, sitting in wheelchair Skin:  Warm, dry, intact, no lesions Psych: normal affect, mood appropriate  Current Medications (verified) Outpatient Encounter Medications as of 11/08/2020  Medication Sig   acetaminophen (TYLENOL) 500 MG tablet Take 1,000 mg by mouth every 6 (six) hours as needed for moderate pain.    albuterol (PROVENTIL HFA;VENTOLIN HFA) 108 (90 BASE) MCG/ACT inhaler Inhale 2 puffs into the lungs every 6 (six) hours as needed for  wheezing or shortness of breath.   allopurinol (ZYLOPRIM) 300 MG tablet Take 300 mg by mouth daily.   aspirin buffered (BUFFERIN) 325 MG TABS tablet Take 325 mg by mouth daily.   atorvastatin (LIPITOR) 20 MG tablet Take 0.5 tablets (10 mg total) by mouth every evening.   azithromycin (ZITHROMAX) 250 MG tablet Take 2 tabs on day one.  Then take 1 tab daily on days 2-7. (Patient taking differently: Take 2 tabs on day one.  Then take 1 tab daily on days 2-7.)   Calcium Carb-Cholecalciferol (CALCIUM + D3 PO) Take 2 tablets by mouth daily with lunch.    cholecalciferol (VITAMIN D) 1000 UNITS tablet Take 2,000 Units by mouth daily with lunch.    colchicine 0.6 MG tablet Take 0.6 mg by mouth daily.   cyclobenzaprine (FLEXERIL) 5 MG tablet Take 1 tablet (5 mg total) by mouth at bedtime as needed for muscle spasms.   diclofenac sodium (VOLTAREN) 1 % GEL Apply 2 g topically 3 (three) times daily. Both hands and knees   DULoxetine (CYMBALTA) 60 MG capsule Take 1 capsule (60 mg total) by mouth daily.   fluticasone (FLONASE) 50 MCG/ACT nasal spray Place 2 sprays into both nostrils daily.   furosemide (LASIX) 80 MG tablet Take 80 mg by mouth.   gabapentin (NEURONTIN) 600 MG tablet Take two in the morning , Two mid- afternoon and two at bedtime   guaiFENesin (MUCINEX) 600 MG 12 hr tablet Take 1,200 mg by mouth 2 (two) times daily.  hydroxychloroquine (PLAQUENIL) 200 MG tablet Take 200 mg by mouth daily.   ipratropium-albuterol (DUONEB) 0.5-2.5 (3) MG/3ML SOLN Take 3 mLs by nebulization every 6 (six) hours as needed (cough/wheezing).    leflunomide (ARAVA) 20 MG tablet Take by mouth.   levothyroxine (SYNTHROID) 50 MCG tablet Take 1 tablet (50 mcg total) by mouth daily.   linaclotide (LINZESS) 72 MCG capsule Take 1 capsule (72 mcg total) by mouth daily before breakfast.   metolazone (ZAROXOLYN) 2.5 MG tablet TAKE 1 TABLET BY MOUTH DAILY AS NEEDED FOR 2 DAYS FOR WEIGHT ABOVE 255 POUNDS   metoprolol succinate  (TOPROL XL) 25 MG 24 hr tablet Take 3 tablets (75 mg total) by mouth daily.   morphine (MS CONTIN) 15 MG 12 hr tablet Take 1 tablet (15 mg total) by mouth every 12 (twelve) hours.   morphine (MSIR) 15 MG tablet One tablet twice a day as needed for break through pain.   Multiple Vitamin (MULTIVITAMIN WITH MINERALS) TABS Take 1 tablet by mouth daily.    olmesartan (BENICAR) 40 MG tablet Take 1 tablet (40 mg total) by mouth daily.   OXYGEN-HELIUM IN Inhale 2 L into the lungs at bedtime. And on exertion   pantoprazole (PROTONIX) 40 MG tablet take 1 tablet by mouth once daily MINUTES BEFORE 1ST MEAL OF THE DAY   potassium chloride SA (KLOR-CON) 20 MEQ tablet TAKE 1 TABLET(20 MEQ) BY MOUTH DAILY   predniSONE (DELTASONE) 5 MG tablet Take 10 mg by mouth every morning.    Semaglutide,0.25 or 0.5MG/DOS, (OZEMPIC, 0.25 OR 0.5 MG/DOSE,) 2 MG/1.5ML SOPN Inject 0.5 mg into the skin once a week.   sodium chloride (OCEAN) 0.65 % SOLN nasal spray Place 1 spray into both nostrils as needed for congestion.   Spacer/Aero-Holding Chambers (AEROCHAMBER MV) inhaler Use as instructed   amoxicillin-clavulanate (AUGMENTIN) 875-125 MG tablet Take 1 tablet by mouth 2 (two) times daily. (Patient not taking: Reported on 11/08/2020)   leflunomide (ARAVA) 10 MG tablet Take 2 tablets (20 mg total) by mouth daily. (Patient not taking: Reported on 11/08/2020)   No facility-administered encounter medications on file as of 11/08/2020.    Allergies (verified) Codeine, Infliximab, and Lisinopril   History: Past Medical History:  Diagnosis Date   Acute on chronic diastolic (congestive) heart failure (HCC)    Anemia    iron deficiency anemia - secondary to blood loss ( chronic)    Arthritis    endstage changes bilateral knees/bilateral ankles.    Asthma    Carotid artery occlusion    Chronic fatigue    Chronic kidney disease    Closed left hip fracture (HCC)    Clotting disorder ([place])    pt denies this   Contusion of left  knee    due to fall 1/14.   COPD (chronic obstructive pulmonary disease) (HCC)    pulmonary fibrosis   Depression, reactive    Diabetes mellitus    type II    Diastolic dysfunction    Difficulty in walking    Family history of heart disease    Generalized muscle weakness    Gout    High cholesterol    History of falling    Hypertension    Hypothyroidism    Interstitial lung disease ([place])    Meningioma of left sphenoid wing involving cavernous sinus ([name]) 02/17/2012   Continue diplopia, left eye pain and left headaches.     Morbid obesity ([place])    Morbid obesity ([place])    Neuromuscular disorder (  [place])    diabetic neuropathy    Normal coronary arteries    cardiac catheterization performed  10/31/14   RA (rheumatoid arthritis) ([place])    has been off methotreaxte since 10/13.   Rheumatoid arthritis (HCC)    Shortness of breath    Spinal stenosis of lumbar region    Thyroid disease    Unspecified lack of coordination    URI (upper respiratory infection)    Past Surgical History:  Procedure Laterality Date   ABDOMINAL HYSTERECTOMY     BRAIN SURGERY     Gamma knife 10/13. Needs repeat spring  '14   CARDIAC CATHETERIZATION N/A 10/31/2014   Procedure: Right/Left Heart Cath and Coronary Angiography;  Surgeon: [name], MD;  Location: [facility];  Service: Cardiovascular;  Laterality: N/A;   ESOPHAGOGASTRODUODENOSCOPY (EGD) WITH PROPOFOL N/A 09/14/2014   Procedure: ESOPHAGOGASTRODUODENOSCOPY (EGD) WITH PROPOFOL;  Surgeon: [name], MD;  Location: [facility];  Service: Endoscopy;  Laterality: N/A;   INCISION AND DRAINAGE HIP Left 01/16/2017   Procedure: IRRIGATION AND DEBRIDEMENT LEFT HIP;  Surgeon: [name], MD;  Location: [facility];  Service: Orthopedics;  Laterality: Left;   INTRAMEDULLARY (IM) NAIL INTERTROCHANTERIC Left 11/29/2016   Procedure: INTRAMEDULLARY (IM) NAIL INTERTROCHANTRIC;  Surgeon: [name], MD;  Location: [facility];   Service: Orthopedics;  Laterality: Left;   OVARY SURGERY     SHOULDER SURGERY Left    TONSILLECTOMY  age 88   VIDEO BRONCHOSCOPY Bilateral 05/31/2013   Procedure: VIDEO BRONCHOSCOPY WITHOUT FLUORO;  Surgeon: Brand Males, MD;  Location: [facility];  Service: Cardiopulmonary;  Laterality: Bilateral;   video bronscoscopy  2000   lung   Family History  Problem Relation Age of Onset   Diabetes Mother    Heart attack Mother    Hypertension Father    Lung cancer Father    Diabetes Sister    Diabetes Brother    Hypertension Brother    Heart disease Brother    Heart attack Brother    Kidney cancer Brother    Uterine cancer Daughter    Breast cancer Sister    Rheum arthritis Maternal Uncle    Gout Brother    Kidney failure Brother        x 5   Social History   Socioeconomic History   Marital status: Married    Spouse name: Not on file   Number of children: 6   Years of education: college   Highest education level: Not on file  Occupational History   Occupation: retired Marine scientist  Tobacco Use   Smoking status: Never   Smokeless tobacco: Never  Vaping Use   Vaping Use: Never used  Substance and Sexual Activity   Alcohol use: No   Drug use: No   Sexual activity: Not Currently  Other Topics Concern   Not on file  Social History Narrative   Patient consumes 2-3 cups coffee per day.   Social Determinants of Health   Financial Resource Strain: Low Risk    Difficulty of Paying Living Expenses: Not hard at all  Food Insecurity: No Food Insecurity   Worried About Charity fundraiser in the Last Year: Never true   [place] in the Last Year: Never true  Transportation Needs: No Transportation Needs   Lack of Transportation (Medical): No   Lack of Transportation (Non-Medical): No  Physical Activity: Not on file  Stress: No Stress Concern Present   Feeling of Stress :  Not at all  Social Connections: Moderately Integrated   Frequency of Communication with Friends and  Family: Twice a week   Frequency of Social Gatherings with Friends and Family: Twice a week   Attends Religious Services: 1 to 4 times per year   Active Member of [employer] or Organizations: No   Attends Archivist Meetings: Never   Marital Status: Married      Assessment:    [place] was seen for f/u. Depression Screen PHQ 2/9 Scores 10/18/2020 10/18/2020 09/24/2020 12/14/2019 09/22/2019 06/11/2018 05/26/2018  PHQ - 2 Score 0 0 _0 PHQ- 9 Score - - - - _1 Fall Risk Fall Risk  10/18/2020 09/24/2020 01/25/2020 12/14/2019 10/19/2019  Falls in the past year? 1 1 0 0 0  Comment - Last fall in March 2022. - - -  Number falls in past yr: 0 0 - 0 -  Comment - - - - -  Injury with Fall? 0 0 - 0 -  Comment - - - - -  Risk Factor Category  - - - - -  Risk for fall due to : Impaired balance/gait;Impaired mobility - - - -  Risk for fall due to: Comment - - - - -  Follow up Falls evaluation completed - - - -  Comment - - - - -    Immunizations Immunization History  Administered Date(s) Administered   Fluad Quad(high Dose 65+) 02/02/2019   Influenza Split 01/29/2012, 03/30/2013, 02/10/2014   Influenza, High Dose Seasonal PF 02/16/2017, 02/11/2018, 01/31/2019, 02/27/2020   Influenza,inj,Quad PF,6+ Mos 02/09/2015, 12/18/2015   Influenza-Unspecified 02/09/2015, 12/18/2015   PFIZER(Purple Top)SARS-COV-2 Vaccination 07/17/2019, 08/14/2019, 02/27/2020   PPD Test 04/23/2015   Pneumococcal Conjugate-13 08/21/2014   Pneumococcal Polysaccharide-23 02/17/2012   Tdap 12/27/2015    TDAP status: Up to date  Flu Vaccine status: Up to date  Pneumococcal vaccine status: Up to date  Covid-19 vaccine status: Completed vaccines  Qualifies for Shingles Vaccine? Yes   Zostavax completed No   Shingrix Completed?: No.    Education has been provided regarding the importance of this vaccine. Patient has been advised to call insurance company to determine out of pocket expense if they have not yet  received this vaccine. Advised may also receive vaccine at local pharmacy or Health Dept. Verbalized acceptance and understanding.  Screening Tests Health Maintenance  Topic Date Due   Hepatitis C Screening  Never done   Zoster Vaccines- Shingrix (1 of 2) Never done   FOOT EXAM  05/23/2020   COVID-19 Vaccine (4 - Booster for Pfizer series) 05/29/2020   INFLUENZA VACCINE  12/17/2020   OPHTHALMOLOGY EXAM  03/15/2021   HEMOGLOBIN A1C  03/29/2021   TETANUS/TDAP  12/26/2025   DEXA SCAN  Completed   PNA vac Low Risk Adult  Completed   HPV VACCINES  Aged Out    Health Maintenance  Health Maintenance Due  Topic Date Due   Hepatitis C Screening  Never done   Zoster Vaccines- Shingrix (1 of 2) Never done   FOOT EXAM  05/23/2020   COVID-19 Vaccine (4 - Booster for Pfizer series) 05/29/2020      Plan:     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) -Controlled -Globin A1c 5.6% on 09/26/2020 -Continue lifestyle modification/diet changes -Continue Ozempic 0.5 mg weekly  Cortical age-related cataract of both eyes -Cataract removal scheduled next week -Continue follow-up with ophthalmology  Bilateral edema of lower extremity -Stable -  Continue Lasix 80 mg and metolazone 2.5 mg -Continue supportive care including elevating LEs when sitting and reducing sodium intake -Compression socks or TED hose. - Plan: BMP with eGFR([facility])  Essential hypertension -Controlled -Continue olmesartan 40 mg, Toprol-XL 25 mg, Lasix 80 mg, metolazone 2.5 mg - Plan:BMP with eGFR([facility])  ILD (interstitial lung disease) (HCC) -Continue 2 L O2 continuously -Albuterol inhaler as needed, DuoNebs -Continue follow-up with [facility] pulmonology and [place] pulm  Chronic diastolic heart failure (HCC) -stable -continue current meds: lasix 80 mg, olmesartan 40 mg, toprol xl 25 mg, metolazone 2.5 mg,  -continue f/u with Cardiology  Dyslipidemia -continue lipitor 20 mg -lifestyle  modifications  Pneumonia of right lower lobe due to infectious organism  -s/p 10 d abx corse - Plan: CBC with Differential/Platelet  F/u in 2-3 months, sooner if needed  [name], MD   11/08/2020

## 2020-11-09 NOTE — Telephone Encounter (Signed)
Pt did call office back and has been scheduled a f/u with [facility] 7/7. Nothing further needed.

## 2020-11-09 NOTE — Telephone Encounter (Signed)
See phone note from 6/21, I am currently treating her for suspected pneumonia. She needs a 2 week fu before surgery

## 2020-11-09 NOTE — Telephone Encounter (Signed)
Tharon Aquas called back and the patient's surgery has been cancelled.   Nothing further needed at this time.

## 2020-11-09 NOTE — Telephone Encounter (Signed)
ATC patient unable to reach left message to call back to make an appointment 2 week prior to surgery.  Dane to see when patient's surgery is scheduled. Had to leave message with nurse line to call back

## 2020-11-12 ENCOUNTER — Telehealth: Payer: Self-pay | Admitting: *Deleted

## 2020-11-12 NOTE — Chronic Care Management (AMB) (Signed)
  Chronic Care Management   Note  11/12/2020 Name: [name] MRN: [id number] DOB: 07-Oct-1940  [name] is a 80 y.o. year old female who is a primary care patient of [name], MD. I reached out to [place] by phone today in response to a referral sent by Ms. Terrye F Tallent's PCP, [name], MD      [name] was given information about Chronic Care Management services today including:  CCM service includes personalized support from designated clinical staff supervised by her physician, including individualized plan of care and coordination with other care providers 24/7 contact phone numbers for assistance for urgent and routine care needs. Service will only be billed when office clinical staff spend 20 minutes or more in a month to coordinate care. Only one practitioner may furnish and bill the service in a calendar month. The patient may stop CCM services at any time (effective at the end of the month) by phone call to the office staff. The patient will be responsible for cost sharing (co-pay) of up to 20% of the service fee (after annual deductible is met).  Patient did not agree to enrollment in care management services and does not wish to consider at this time.  Follow up plan: Patient declines engagement by the care management team. Appropriate care team members and provider have been notified via electronic communication. The care management team is available to follow up with the patient after provider conversation with the patient regarding recommendation for care management engagement and subsequent re-referral to the care management team.   [facility]

## 2020-11-22 ENCOUNTER — Encounter: Payer: Self-pay | Admitting: Primary Care

## 2020-11-22 ENCOUNTER — Telehealth: Payer: Self-pay | Admitting: Cardiovascular Disease

## 2020-11-22 ENCOUNTER — Other Ambulatory Visit: Payer: Self-pay

## 2020-11-22 ENCOUNTER — Ambulatory Visit (INDEPENDENT_AMBULATORY_CARE_PROVIDER_SITE_OTHER): Payer: Medicare Other

## 2020-11-22 ENCOUNTER — Ambulatory Visit (INDEPENDENT_AMBULATORY_CARE_PROVIDER_SITE_OTHER): Payer: Medicare Other | Admitting: Primary Care

## 2020-11-22 VITALS — BP 136/84 | HR 106 | Temp 97.8°F | Resp 18 | Ht 64.0 in | Wt 236.4 lb

## 2020-11-22 DIAGNOSIS — I10 Essential (primary) hypertension: Secondary | ICD-10-CM

## 2020-11-22 DIAGNOSIS — M05741 Rheumatoid arthritis with rheumatoid factor of right hand without organ or systems involvement: Secondary | ICD-10-CM

## 2020-11-22 DIAGNOSIS — R918 Other nonspecific abnormal finding of lung field: Secondary | ICD-10-CM | POA: Diagnosis not present

## 2020-11-22 DIAGNOSIS — J8410 Pulmonary fibrosis, unspecified: Secondary | ICD-10-CM

## 2020-11-22 DIAGNOSIS — J9611 Chronic respiratory failure with hypoxia: Secondary | ICD-10-CM | POA: Diagnosis not present

## 2020-11-22 DIAGNOSIS — J189 Pneumonia, unspecified organism: Secondary | ICD-10-CM

## 2020-11-22 DIAGNOSIS — Z8709 Personal history of other diseases of the respiratory system: Secondary | ICD-10-CM | POA: Diagnosis not present

## 2020-11-22 DIAGNOSIS — Z01811 Encounter for preprocedural respiratory examination: Secondary | ICD-10-CM | POA: Diagnosis not present

## 2020-11-22 DIAGNOSIS — M05742 Rheumatoid arthritis with rheumatoid factor of left hand without organ or systems involvement: Secondary | ICD-10-CM | POA: Diagnosis not present

## 2020-11-22 MED ORDER — PREDNISONE 10 MG PO TABS: ORAL_TABLET | ORAL | 0 refills | Status: DC

## 2020-11-22 MED ORDER — METOPROLOL SUCCINATE ER 25 MG PO TB24: 75.0000 mg | ORAL_TABLET | Freq: Every day | ORAL | 3 refills | Status: DC

## 2020-11-22 MED ORDER — FUROSEMIDE 80 MG PO TABS: 80.0000 mg | ORAL_TABLET | Freq: Every day | ORAL | 3 refills | Status: DC

## 2020-11-22 NOTE — Progress Notes (Signed)
Please let patient know right base infiltrate has resolved on imaging. Continue with my recommendations from today's visit. [place] for cataract surgery

## 2020-11-22 NOTE — Assessment & Plan Note (Signed)
-  Stable today; O2 95% RA - Continues to benefit from supplemental oxygen, she uses 2L with exertion and at night  - She has received POC from [facility] since last visit

## 2020-11-22 NOTE — Progress Notes (Signed)
_0  ID: [name], female    DOB: [birth year], 80 y.o.   MRN: [id number]  Chief Complaint  Patient presents with   Follow-up    2 L [place] with exertion and during sleep, experiencing wheezing and coughing up yellow mucous, pain in side has subsided    Referring provider: [name], MD  HPI:  80 year old female, never smoked. PMH significant for ILD, chronic respiratory failure on nocturnal oxygen, bronchiectasis, rheumatoid arthritis, HTN, diastolic heart failure, GERD, type 2 diabetes, CKD stage 3, chronic back pain, hx DVT in Jan 2020. Patient of [name], last seen by pulmonary NP on 07/30/20. She follows with rheumatology and pulmonary at [facility].    Previous [facility] pulmonary encounter: 07/30/2020  Patient returns for a 1 year follow-up.  She is followed at [facility] pulmonary for her bronchiectasis and pulmonary fibrosis.  She is on flutter valve, nebulizer therapy.  And 3 days a week azithromycin.  Says overall she has not had a flare of cough or congestion.  No recent antibiotics or hospitalizations for her breathing. Patient does get short of breath with minimum activity.  She is sedentary.  Uses a walker at home.  Does chair exercises but is very limited by her joint pain and breathing issues.  But says overall over the last year she has been better than her usual.   Has rheumatoid arthritis followed by [facility] rheumatology -on chronic steroids prednisone 10 mg daily, Leflunomide , and Plaquenil.  Has chronic pain on chronic narcotics.   Remains on Oxygen 2l/m At bedtime  . Says oxygen does drop with walking but does not have a portable system. Walk test in office shows oxygen level at 87% walking on room air.  Required 2 L of oxygen to maintain sats greater than 88 to 90%.   Patient is followed by cardiology for diastolic heart failure and chronic edema.  She is on Lasix and metolazone.  She says her leg swelling has been doing better but has daily leg swelling  always.   10/22/2020 Patient presents today for qualifying O2 walk. She wears 2L oxygen at bedtime. In March she was noted to have mild oxygen desaturations to 87% on RA, recommended to wear 2L with activity and order for POC sent to [employer]. She has been with them longer than 5 years. She spoke with medicare and switching companied should not be an issue on their end. Patient would like to use [employer] for new oxygen. Needs walk test today.    She is feeling ok. She has had some pain to rib cage on her right side for 2 weeks. She has a chronic cough which is better. Mucus is white-clear color. States that it is "not as bad as it was". She has morning congestion. She takes mucinex twice a day and uses flutter valve which she uses 4 times a day. Dr. Pearline Cables had wanted patient on Azithromycin MWF for bronchiextsis. She needs refill, has one day left of medication.  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 Needs CXR on return visit.  11/22/2020- Interim hx  Patient presents today for 2 week follow-up PNA. She had a CXR that showed slight increase in opacity right lung base, questionable infiltrate. Treated with Augmentin 1 tab BID x 10 days. She is feeling a great deal better. She no longer has right sided pain. She still has some wheezing and cough with yellow mucus. She is taking muciex 1,213m twice a day. She is using her  flutter valve 3-4 times a day. She has been using nebulizer twice a day. She takes 60m prednisone daily. She is on Toprol XL 741m she has not taken this yet today. She takes Azithromycin MWF pulmonary with [facility]. She can resume this. Needs repeat CXR today.  TEST/EVENTS : High-resolution CT chest November 24, 2018 mid to lower lung bronchiectasis, groundglass reticular densities stable since 2016   Allergies  Allergen Reactions   Codeine Swelling and Other (See Comments)    Facial swelling Chest pain and swelling in legs  Facial swelling   Infliximab  Anaphylaxis    "sent me into shock" "sent me into shock"   Lisinopril Swelling    Face and neck swelling Face and neck swelling    Immunization History  Administered Date(s) Administered   Fluad Quad(high Dose 65+) 02/02/2019   Influenza Split 01/29/2012, 03/30/2013, 02/10/2014   Influenza, High Dose Seasonal PF 02/16/2017, 02/11/2018, 01/31/2019, 02/27/2020   Influenza,inj,Quad PF,6+ Mos 02/09/2015, 12/18/2015   Influenza-Unspecified 02/09/2015, 12/18/2015   PFIZER(Purple Top)SARS-COV-2 Vaccination 07/17/2019, 08/14/2019, 02/27/2020   PPD Test 04/23/2015   Pneumococcal Conjugate-13 08/21/2014   Pneumococcal Polysaccharide-23 02/17/2012   Tdap 12/27/2015    Past Medical History:  Diagnosis Date   Acute on chronic diastolic (congestive) heart failure (HCC)    Anemia    iron deficiency anemia - secondary to blood loss ( chronic)    Arthritis    endstage changes bilateral knees/bilateral ankles.    Asthma    Carotid artery occlusion    Chronic fatigue    Chronic kidney disease    Closed left hip fracture (HCC)    Clotting disorder ([facility]   pt denies this   Contusion of left knee    due to fall 1/14.   COPD (chronic obstructive pulmonary disease) (HCC)    pulmonary fibrosis   Depression, reactive    Diabetes mellitus    type II    Diastolic dysfunction    Difficulty in walking    Family history of heart disease    Generalized muscle weakness    Gout    High cholesterol    History of falling    Hypertension    Hypothyroidism    Interstitial lung disease ([facility]   Meningioma of left sphenoid wing involving cavernous sinus ([facility]10/05/2011   Continue diplopia, left eye pain and left headaches.     Morbid obesity ([facility]   Morbid obesity ([facility]   Neuromuscular disorder (H[facility]   diabetic neuropathy    Normal coronary arteries    cardiac catheterization performed  10/31/14   RA (rheumatoid arthritis) ([facility]   has been off methotreaxte since 10/13.   Rheumatoid  arthritis (HCC)    Shortness of breath    Spinal stenosis of lumbar region    Thyroid disease    Unspecified lack of coordination    URI (upper respiratory infection)     Tobacco History: Social History   Tobacco Use  Smoking Status Never  Smokeless Tobacco Never   Counseling given: Not Answered   Outpatient Medications Prior to Visit  Medication Sig Dispense Refill   acetaminophen (TYLENOL) 500 MG tablet Take 1,000 mg by mouth every 6 (six) hours as needed for moderate pain.      albuterol (PROVENTIL HFA;VENTOLIN HFA) 108 (90 BASE) MCG/ACT inhaler Inhale 2 puffs into the lungs every 6 (six) hours as needed for wheezing or shortness of breath.     allopurinol (ZYLOPRIM) 300 MG tablet Take 300  mg by mouth daily.     aspirin buffered (BUFFERIN) 325 MG TABS tablet Take 325 mg by mouth daily.     atorvastatin (LIPITOR) 20 MG tablet Take 0.5 tablets (10 mg total) by mouth every evening. 90 tablet 2   azithromycin (ZITHROMAX) 250 MG tablet Take 2 tabs on day one.  Then take 1 tab daily on days 2-7. (Patient taking differently: Take 2 tabs on day one.  Then take 1 tab daily on days 2-7.) 8 tablet 0   Calcium Carb-Cholecalciferol (CALCIUM + D3 PO) Take 2 tablets by mouth daily with lunch.      cholecalciferol (VITAMIN D) 1000 UNITS tablet Take 2,000 Units by mouth daily with lunch.      colchicine 0.6 MG tablet Take 0.6 mg by mouth daily.     cyclobenzaprine (FLEXERIL) 5 MG tablet Take 1 tablet (5 mg total) by mouth at bedtime as needed for muscle spasms. 90 tablet 3   diclofenac sodium (VOLTAREN) 1 % GEL Apply 2 g topically 3 (three) times daily. Both hands and knees 300 g 3   DULoxetine (CYMBALTA) 60 MG capsule Take 1 capsule (60 mg total) by mouth daily. 10 capsule 0   fluticasone (FLONASE) 50 MCG/ACT nasal spray Place 2 sprays into both nostrils daily. 16 g 3   furosemide (LASIX) 80 MG tablet Take 80 mg by mouth.     gabapentin (NEURONTIN) 600 MG tablet Take two in the morning , Two  mid- afternoon and two at bedtime 540 tablet 3   guaiFENesin (MUCINEX) 600 MG 12 hr tablet Take 1,200 mg by mouth 2 (two) times daily.     hydroxychloroquine (PLAQUENIL) 200 MG tablet Take 200 mg by mouth daily.     ipratropium-albuterol (DUONEB) 0.5-2.5 (3) MG/3ML SOLN Take 3 mLs by nebulization every 6 (six) hours as needed (cough/wheezing).      leflunomide (ARAVA) 10 MG tablet Take 20 mg by mouth daily.     leflunomide (ARAVA) 20 MG tablet Take by mouth.     levothyroxine (SYNTHROID) 50 MCG tablet Take 1 tablet (50 mcg total) by mouth daily. 90 tablet 2   linaclotide (LINZESS) 72 MCG capsule Take 1 capsule (72 mcg total) by mouth daily before breakfast. 90 capsule 1   metolazone (ZAROXOLYN) 2.5 MG tablet TAKE 1 TABLET BY MOUTH DAILY AS NEEDED FOR 2 DAYS FOR WEIGHT ABOVE 255 POUNDS 90 tablet 1   metoprolol succinate (TOPROL XL) 25 MG 24 hr tablet Take 3 tablets (75 mg total) by mouth daily. 270 tablet 3   morphine (MS CONTIN) 15 MG 12 hr tablet Take 1 tablet (15 mg total) by mouth every 12 (twelve) hours. 60 tablet 0   morphine (MSIR) 15 MG tablet One tablet twice a day as needed for break through pain. 60 tablet 0   Multiple Vitamin (MULTIVITAMIN WITH MINERALS) TABS Take 1 tablet by mouth daily.      olmesartan (BENICAR) 40 MG tablet Take 1 tablet (40 mg total) by mouth daily. 90 tablet 2   OXYGEN-HELIUM IN Inhale 2 L into the lungs at bedtime. And on exertion     pantoprazole (PROTONIX) 40 MG tablet take 1 tablet by mouth once daily MINUTES BEFORE 1ST MEAL OF THE DAY 90 tablet 2   potassium chloride SA (KLOR-CON) 20 MEQ tablet TAKE 1 TABLET(20 MEQ) BY MOUTH DAILY 90 tablet 0   predniSONE (DELTASONE) 5 MG tablet Take 10 mg by mouth every morning.   0   Semaglutide,0.25 or 0.5MG/DOS, ([place],  0.25 OR 0.5 MG/DOSE,) 2 MG/1.5ML SOPN Inject 0.5 mg into the skin once a week. 3 mL 5   sodium chloride (OCEAN) 0.65 % SOLN nasal spray Place 1 spray into both nostrils as needed for congestion.      Spacer/Aero-Holding Chambers (AEROCHAMBER MV) inhaler Use as instructed 1 each 0   amoxicillin-clavulanate (AUGMENTIN) 875-125 MG tablet Take 1 tablet by mouth 2 (two) times daily. 20 tablet 0   No facility-administered medications prior to visit.   Review of Systems  Review of Systems  Constitutional: Negative.   Respiratory:  Positive for cough and wheezing. Negative for chest tightness and shortness of breath.     Physical Exam  BP 136/84 (BP Location: Right Arm, Patient Position: Sitting, Cuff Size: Normal)   Pulse (!) 106   Temp 97.8 F (36.6 C) (Oral)   Resp 18   Ht _0  (1.626 m)   Wt 236 lb 6.4 oz (107.2 kg)   SpO2 95%   BMI 40.58 kg/m  Physical Exam Constitutional:      Appearance: Normal appearance.  Cardiovascular:     Rate and Rhythm: Normal rate and regular rhythm.  Pulmonary:     Effort: Pulmonary effort is normal.     Breath sounds: Normal breath sounds.     Comments: Fine crackles lung bases R>L Neurological:     General: No focal deficit present.     Mental Status: She is alert and oriented to person, place, and time. Mental status is at baseline.  Psychiatric:        Mood and Affect: Mood normal.        Behavior: Behavior normal.        Thought Content: Thought content normal.        Judgment: Judgment normal.     Lab Results:  CBC    Component Value Date/Time   WBC 8.5 11/08/2020 1122   RBC 4.00 11/08/2020 1122   HGB 12.1 11/08/2020 1122   HGB 11.7 02/22/2018 1256   HGB 12.4 02/17/2012 1337   HCT 37.6 11/08/2020 1122   HCT 35.9 02/22/2018 1256   HCT 37.2 02/17/2012 1337   PLT 145.0 (L) 11/08/2020 1122   PLT 205 02/22/2018 1256   MCV 94.0 11/08/2020 1122   MCV 92 02/22/2018 1256   MCV 94.1 02/17/2012 1337   MCH 30.8 11/10/2019 2052   MCHC 32.2 11/08/2020 1122   RDW 15.3 11/08/2020 1122   RDW 14.0 02/22/2018 1256   RDW 16.0 (H) 02/17/2012 1337   LYMPHSABS 1.6 11/08/2020 1122   LYMPHSABS 1.2 02/17/2012 1337   MONOABS 0.7 11/08/2020  1122   MONOABS 0.4 02/17/2012 1337   EOSABS 0.2 11/08/2020 1122   EOSABS 0.3 02/17/2012 1337   BASOSABS 0.0 11/08/2020 1122   BASOSABS 0.0 02/17/2012 1337    BMET    Component Value Date/Time   NA 145 11/08/2020 1122   NA 149 (A) 08/01/2020 0000   NA 139 02/17/2012 1337   K 3.6 11/08/2020 1122   K 4.8 02/17/2012 1337   CL 103 11/08/2020 1122   CL 103 02/17/2012 1337   CO2 34 (H) 11/08/2020 1122   CO2 25 02/17/2012 1337   GLUCOSE 124 (H) 11/08/2020 1122   GLUCOSE 112 (H) 02/17/2012 1337   BUN 21 11/08/2020 1122   BUN 23 (A) 08/01/2020 0000   BUN 32.0 (H) 02/17/2012 1337   CREATININE 0.96 11/08/2020 1122   CREATININE 1.57 (H) 09/17/2016 1438   CREATININE 1.7 (H) 02/17/2012 1337  CALCIUM 8.7 11/08/2020 1122   CALCIUM 8.9 02/17/2012 1337   GFRNONAA 43 (L) 11/10/2019 2052   GFRNONAA 32 (L) 09/17/2016 1438   GFRAA 49 (L) 11/10/2019 2052   GFRAA 37 (L) 09/17/2016 1438    BNP    Component Value Date/Time   BNP 25.9 04/27/2019 0336    ProBNP    Component Value Date/Time   PROBNP 18.0 06/06/2014 1250    Imaging: No results found.   Assessment & Plan:   Right lower lobe pneumonia - Treated for suspected CAP 2 week ago with 10 day course of Augmentin. She is feeling a great deal better, no longer has right sided pleuritic pain. She has some residual wheezing and cough with yellow mucus. Continue mucinex, flutter valve and bronchodilators.  Needs repeat imaging today to monitor.   Chronic respiratory failure with hypoxia (HCC) - Stable today; O2 95% RA - Continues to benefit from supplemental oxygen, she uses 2L with exertion and at night  - She has received POC from [facility] since last visit   Pre-operative respiratory examination - As long as CXR shows resolution or improvement in RLL pneumonia she is cleared from pulmonary standpoint for cataract surgery   Pulmonary fibrosis, postinflammatory (HCC) - Stable on previous scans.  Following with [facility], Dr.  Pearline Cables  - Continue Azithromycin MWF and prednisone 17m daily - FU in 3 months with Dr. RChase Caller  Rheumatoid arthritis ([facility] - Continue Plaquenil, following with Rheumatology    EMartyn Ehrich NP 11/22/2020

## 2020-11-22 NOTE — Assessment & Plan Note (Signed)
-  Treated for suspected CAP 2 week ago with 10 day course of Augmentin. She is feeling a great deal better, no longer has right sided pleuritic pain. She has some residual wheezing and cough with yellow mucus. Continue mucinex, flutter valve and bronchodilators.  Needs repeat imaging today to monitor.

## 2020-11-22 NOTE — Telephone Encounter (Signed)
*  STAT* If patient is at the pharmacy, call can be transferred to refill team.   1. Which medications need to be refilled? (please list name of each medication and dose if known)   furosemide (LASIX) 80 MG tablet  metoprolol succinate (TOPROL XL) 25 MG 24 hr tablet  2. Which pharmacy/location (including street and city if local pharmacy) is medication to be sent to? [phone] - [place], [address] AT [address]  3. Do they need a 30 day or 90 day supply? 90 days

## 2020-11-22 NOTE — Assessment & Plan Note (Signed)
-  Continue Plaquenil, following with Rheumatology

## 2020-11-22 NOTE — Patient Instructions (Addendum)
Recommendations: Continue Mucinex 1,232m twice a day Continue Flonase nasal spray; Add back ocean nasal spray twice a day Continue flutter valve three times a day Continue nebulizer twice a day  Continue Plaquenil as directed by Rheumatology  Continue supplemental oxygen  Take prednisone taper as directed; THEN resume daily dose   Orders: CXR today  Follow-up: 3 months with Dr. RChase Caller(ILD slot)

## 2020-11-22 NOTE — Assessment & Plan Note (Signed)
-  Stable on previous scans.  Following with [facility], Dr. Pearline Cables  - Continue Azithromycin MWF and prednisone 30m daily - FU in 3 months with Dr. RChase Caller

## 2020-11-22 NOTE — Assessment & Plan Note (Addendum)
-  Exam was benign today. As long as CXR shows resolution or improvement in RLL pneumonia she is cleared from pulmonary standpoint for cataract surgery

## 2020-11-26 ENCOUNTER — Telehealth: Payer: Self-pay | Admitting: Nurse Practitioner

## 2020-11-26 ENCOUNTER — Other Ambulatory Visit: Payer: Self-pay

## 2020-11-26 ENCOUNTER — Encounter: Payer: Medicare Other | Attending: Physical Medicine & Rehabilitation | Admitting: Registered Nurse

## 2020-11-26 ENCOUNTER — Encounter: Payer: Self-pay | Admitting: Registered Nurse

## 2020-11-26 VITALS — BP 151/94 | HR 97 | Temp 99.2°F | Ht 64.0 in | Wt 236.4 lb

## 2020-11-26 DIAGNOSIS — M542 Cervicalgia: Secondary | ICD-10-CM | POA: Diagnosis not present

## 2020-11-26 DIAGNOSIS — G894 Chronic pain syndrome: Secondary | ICD-10-CM | POA: Diagnosis not present

## 2020-11-26 DIAGNOSIS — Z5181 Encounter for therapeutic drug level monitoring: Secondary | ICD-10-CM | POA: Diagnosis not present

## 2020-11-26 DIAGNOSIS — M2550 Pain in unspecified joint: Secondary | ICD-10-CM | POA: Diagnosis not present

## 2020-11-26 DIAGNOSIS — M7062 Trochanteric bursitis, left hip: Secondary | ICD-10-CM | POA: Diagnosis not present

## 2020-11-26 DIAGNOSIS — M5416 Radiculopathy, lumbar region: Secondary | ICD-10-CM | POA: Insufficient documentation

## 2020-11-26 DIAGNOSIS — M47816 Spondylosis without myelopathy or radiculopathy, lumbar region: Secondary | ICD-10-CM | POA: Insufficient documentation

## 2020-11-26 DIAGNOSIS — M5412 Radiculopathy, cervical region: Secondary | ICD-10-CM | POA: Insufficient documentation

## 2020-11-26 DIAGNOSIS — M7061 Trochanteric bursitis, right hip: Secondary | ICD-10-CM | POA: Diagnosis not present

## 2020-11-26 DIAGNOSIS — Z79891 Long term (current) use of opiate analgesic: Secondary | ICD-10-CM | POA: Insufficient documentation

## 2020-11-26 DIAGNOSIS — M546 Pain in thoracic spine: Secondary | ICD-10-CM | POA: Insufficient documentation

## 2020-11-26 DIAGNOSIS — M170 Bilateral primary osteoarthritis of knee: Secondary | ICD-10-CM | POA: Diagnosis not present

## 2020-11-26 DIAGNOSIS — G8929 Other chronic pain: Secondary | ICD-10-CM | POA: Diagnosis not present

## 2020-11-26 MED ORDER — MORPHINE SULFATE 15 MG PO TABS: ORAL_TABLET | ORAL | 0 refills | Status: DC

## 2020-11-26 MED ORDER — MORPHINE SULFATE ER 15 MG PO TBCR: 15.0000 mg | EXTENDED_RELEASE_TABLET | Freq: Two times a day (BID) | ORAL | 0 refills | Status: DC

## 2020-11-26 NOTE — Progress Notes (Signed)
Subjective:    Patient ID: [name], female    DOB: [birth year], 80 y.o.   MRN: [id number]  HPI: [name] is a 80 y.o. female who returns for follow up appointment for chronic pain and medication refill. She states her  pain is located in her right hand with tingling and numbness,lower back radiating into her bilateral hips ,bilateral knee pain, bilateral lower extremity sand bilateral feet pain with tingling and numbness.He rates his pain 7. His  current exercise regime is walking and performing chair stretching exercises.    Ms. Borman Morphine equivalent is 60,00  MME.   Oral Swab was performed today.   Pain Inventory Average Pain 7 Pain Right Now 7 My pain is sharp, tingling, and aching  In the last 24 hours, has pain interfered with the following? General activity 7 Relation with others 7 Enjoyment of life 7 What TIME of day is your pain at its worst? morning  Sleep (in general) Fair  Pain is worse with: walking, bending, and some activites Pain improves with: rest, heat/ice, therapy/exercise, pacing activities, medication, and injections Relief from Meds: 6  Family History  Problem Relation Age of Onset   Diabetes Mother    Heart attack Mother    Hypertension Father    Lung cancer Father    Diabetes Sister    Diabetes Brother    Hypertension Brother    Heart disease Brother    Heart attack Brother    Kidney cancer Brother    Uterine cancer Daughter    Breast cancer Sister    Rheum arthritis Maternal Uncle    Gout Brother    Kidney failure Brother        x 5   Social History   Socioeconomic History   Marital status: Married    Spouse name: Not on file   Number of children: 6   Years of education: college   Highest education level: Not on file  Occupational History   Occupation: retired Marine scientist  Tobacco Use   Smoking status: Never   Smokeless tobacco: Never  Vaping Use   Vaping Use: Never used  Substance and Sexual Activity   Alcohol  use: No   Drug use: No   Sexual activity: Not Currently  Other Topics Concern   Not on file  Social History Narrative   Patient consumes 2-3 cups coffee per day.   Social Determinants of Health   Financial Resource Strain: Low Risk    Difficulty of Paying Living Expenses: Not hard at all  Food Insecurity: No Food Insecurity   Worried About Charity fundraiser in the Last Year: Never true   [place] in the Last Year: Never true  Transportation Needs: No Transportation Needs   Lack of Transportation (Medical): No   Lack of Transportation (Non-Medical): No  Physical Activity: Not on file  Stress: No Stress Concern Present   Feeling of Stress : Not at all  Social Connections: Moderately Integrated   Frequency of Communication with Friends and Family: Twice a week   Frequency of Social Gatherings with Friends and Family: Twice a week   Attends Religious Services: 1 to 4 times per year   Active Member of [employer] or Organizations: No   Attends Archivist Meetings: Never   Marital Status: Married   Past Surgical History:  Procedure Laterality Date   ABDOMINAL HYSTERECTOMY     BRAIN SURGERY     Gamma knife 10/13. Needs  repeat spring  '14   CARDIAC CATHETERIZATION N/A 10/31/2014   Procedure: Right/Left Heart Cath and Coronary Angiography;  Surgeon: [name], MD;  Location: [facility];  Service: Cardiovascular;  Laterality: N/A;   ESOPHAGOGASTRODUODENOSCOPY (EGD) WITH PROPOFOL N/A 09/14/2014   Procedure: ESOPHAGOGASTRODUODENOSCOPY (EGD) WITH PROPOFOL;  Surgeon: [name], MD;  [facility]: [facility];  Service: Endoscopy;  Laterality: N/A;   INCISION AND DRAINAGE HIP Left 01/16/2017   Procedure: IRRIGATION AND DEBRIDEMENT LEFT HIP;  Surgeon: [name], MD;  Location: [facility];  Service: Orthopedics;  Laterality: Left;   INTRAMEDULLARY (IM) NAIL INTERTROCHANTERIC Left 11/29/2016   Procedure: INTRAMEDULLARY (IM) NAIL INTERTROCHANTRIC;   Surgeon: [name], MD;  Location: [facility];  Service: Orthopedics;  Laterality: Left;   OVARY SURGERY     SHOULDER SURGERY Left    TONSILLECTOMY  age 68   VIDEO BRONCHOSCOPY Bilateral 05/31/2013   Procedure: VIDEO BRONCHOSCOPY WITHOUT FLUORO;  Surgeon: Brand Males, MD;  Location: [facility];  Service: Cardiopulmonary;  Laterality: Bilateral;   video bronscoscopy  2000   lung   Past Surgical History:  Procedure Laterality Date   ABDOMINAL HYSTERECTOMY     BRAIN SURGERY     Gamma knife 10/13. Needs repeat spring  '14   CARDIAC CATHETERIZATION N/A 10/31/2014   Procedure: Right/Left Heart Cath and Coronary Angiography;  Surgeon: [name], MD;  Location: [facility];  Service: Cardiovascular;  Laterality: N/A;   ESOPHAGOGASTRODUODENOSCOPY (EGD) WITH PROPOFOL N/A 09/14/2014   Procedure: ESOPHAGOGASTRODUODENOSCOPY (EGD) WITH PROPOFOL;  Surgeon: [name], MD;  [facility];  Service: Endoscopy;  Laterality: N/A;   INCISION AND DRAINAGE HIP Left 01/16/2017   Procedure: IRRIGATION AND DEBRIDEMENT LEFT HIP;  Surgeon: [name], MD;  [facility];  Service: Orthopedics;  Laterality: Left;   INTRAMEDULLARY (IM) NAIL INTERTROCHANTERIC Left 11/29/2016   Procedure: INTRAMEDULLARY (IM) NAIL INTERTROCHANTRIC;  Surgeon: [name], MD;  Location: [facility];  Service: Orthopedics;  Laterality: Left;   OVARY SURGERY     SHOULDER SURGERY Left    TONSILLECTOMY  age 15   VIDEO BRONCHOSCOPY Bilateral 05/31/2013   Procedure: VIDEO BRONCHOSCOPY WITHOUT FLUORO;  Surgeon: Brand Males, MD;  Location: [facility];  Service: Cardiopulmonary;  Laterality: Bilateral;   video bronscoscopy  2000   lung   Past Medical History:  Diagnosis Date   Acute on chronic diastolic (congestive) heart failure (HCC)    Anemia    iron deficiency anemia - secondary to blood loss ( chronic)    Arthritis    endstage changes bilateral knees/bilateral  ankles.    Asthma    Carotid artery occlusion    Chronic fatigue    Chronic kidney disease    Closed left hip fracture (HCC)    Clotting disorder ([place])    pt denies this   Contusion of left knee    due to fall 1/14.   COPD (chronic obstructive pulmonary disease) (HCC)    pulmonary fibrosis   Depression, reactive    Diabetes mellitus    type II    Diastolic dysfunction    Difficulty in walking    Family history of heart disease    Generalized muscle weakness    Gout    High cholesterol    History of falling    Hypertension    Hypothyroidism    Interstitial lung disease ([place])    Meningioma of left sphenoid wing involving cavernous sinus ([place]) 02/17/2012   Continue  diplopia, left eye pain and left headaches.     Morbid obesity ([place])    Morbid obesity ([place])    Neuromuscular disorder ([place])    diabetic neuropathy    Normal coronary arteries    cardiac catheterization performed  10/31/14   RA (rheumatoid arthritis) ([place])    has been off methotreaxte since 10/13.   Rheumatoid arthritis (HCC)    Shortness of breath    Spinal stenosis of lumbar region    Thyroid disease    Unspecified lack of coordination    URI (upper respiratory infection)    BP (!) 151/94 (BP Location: Right Arm)   Pulse 97   Temp 99.2 F (37.3 C) (Oral)   Ht _0  (1.626 m)   Wt 236 lb 6.4 oz (107.2 kg)   SpO2 96%   BMI 40.58 kg/m   Opioid Risk Score:   Fall Risk Score:  `1  Depression screen PHQ 2/9  Depression screen [facility] 2/9 10/18/2020 10/18/2020 09/24/2020 12/14/2019 09/22/2019 06/11/2018 05/26/2018  Decreased Interest 0 0 _1 Down, Depressed, Hopeless 0 0 _2 PHQ - 2 Score 0 0 _3 Altered sleeping - - - - _4 Tired, decreased energy - - - - _5 Change in appetite - - - - _6 Feeling bad or failure about yourself  - - - - _7 Trouble concentrating - - - - 2 2 0  Moving slowly or fidgety/restless - - - - 1 0 2  Suicidal thoughts - - - - 0 0 0  PHQ-9 Score - - - - _8 Difficult doing work/chores - - - - Somewhat difficult - Somewhat difficult  Some recent data might be hidden     Review of Systems  Constitutional: Negative.   HENT: Negative.    Eyes: Negative.   Respiratory: Negative.    Cardiovascular: Negative.   Gastrointestinal:  Negative for abdominal distention.  Endocrine: Negative.   Genitourinary: Negative.   Musculoskeletal:  Positive for back pain and gait problem.       Pain in right arm , pain in both left and right knee . Pain in both hands   Skin: Negative.   Allergic/Immunologic: Negative.   Hematological: Negative.   Psychiatric/Behavioral: Negative.        Objective:   Physical Exam Vitals and nursing note reviewed.  Constitutional:      Appearance: Normal appearance.  Neck:     Comments: Cervical Paraspinal Tenderness: C-5-C-6 Cardiovascular:     Rate and Rhythm: Normal rate and regular rhythm.     Pulses: Normal pulses.     Heart sounds: Normal heart sounds.  Pulmonary:     Effort: Pulmonary effort is normal.     Breath sounds: Normal breath sounds.     Comments: Continuous Oxygen 2 Liters nasal cannula Musculoskeletal:     Cervical back: Normal range of motion and neck supple.     Right lower leg: Edema present.     Left lower leg: Edema present.     Comments: Normal Muscle Bulk and Muscle Testing Reveals:  Upper Extremities: Full ROM and Muscle Strength 5/5  Thoracic and Lumbar Hypersensitivity Bilateral Greater Trochanter Tenderness Lower Extremities: Full ROM and Muscle Strength 5/5 Arrived in wheelchair    Skin:    General: Skin is warm and dry.  Neurological:  Mental Status: She is alert and oriented to person, place, and time.  Psychiatric:        Mood and Affect: Mood normal.        Behavior: Behavior normal.         Assessment & Plan:  1. Lumbar spinal stenosis with neurogenic claudication. Associated facet arthropathy/ Lumbar Radiculitis: Continue current medication regime with  Gabapentin and HEP as tolerated. 11/26/2020 Refilled: MS Contin 15 mg one tablet every 12 hours #60 and  MSIR 15 one tablet twice a day as needed  For break through pain ( 6 am and 1:00 pm)  #60.since adverse effect with insomnia with Nucynta. Nucynta discontinued due to insomnia.  We will continue the opioid monitoring program, this consists of regular clinic visits, examinations, urine drug screen, pill counts as well as use of [place] Controlled Substance Reporting system. A 12 month History has been reviewed on the [place] Controlled Substance Reporting System 11/26/2020. 2. Chronic Bilateral Thoracic Back Pain: Continue HEP as Tolerated. Continue to Monitor. 11/26/2020 3 Depression: Continue current medication Regime: Cymbalta . Continue to monitor.11/26/2020 4. Diabetes mellitus type 2 with polyneuropathy: Continue current medication regime: Gabapentin. 11/26/2020 5. Rheumatoid arthritis and osteoarthritis. Continue current medication Regime.  Voltaren Gel. Rheumatology Following. 11/26/2020 6. Interstitial lung disease: Pulmonology Following. 11/26/2020. 7. Bilateral Osteoarthritis Knee's:  Scheduled for OA bilateral ZILRETTA INJECTION with Dr Naaman Plummer Continue current medication regimen. Voltaren Gel. 11/26/2020. 8. Muscle Spasm: Continue current medication regime : Flexeril. Continue to monitor.11/26/2020 9. Bilateral Greater Trochanteric Tenderness: L>R. Continue current treatment with Ice/Heat Therapy. 11/26/2020. 10. Polyarthralgia: Rheumatology Following: Continue to monitor.11/26/2020 11. Morbid Obesitity: Continue Healthy Diet Regime and HEP. Continue to monitor.  11/26/2020 12. Right Shoulder tendonitis: No complaints today..Continue to Alternate with Heat and Ice Therapy. Continue to monitor. 11/26/2020     F/U in 2 months

## 2020-11-26 NOTE — Telephone Encounter (Signed)
At the request of the Palliative NP, she wanted me to call patient to see if we could reschedule the in-home Palliative f/u visit on 12/04/20 to a telephone f/u visit on 12/05/20 (due to urgent new referral).  Attempted to reach patient at home number with no answer and no voicemail.  I then attempted to reach patient on her cell and left a message requesting a return call to let me know if this would be okay or not.

## 2020-12-01 LAB — DRUG TOX MONITOR 1 W/CONF, ORAL FLD

## 2020-12-01 LAB — DRUG TOX ALC METAB W/CON, ORAL FLD: Alcohol Metabolite: NEGATIVE ng/mL (ref <25)

## 2020-12-04 ENCOUNTER — Other Ambulatory Visit: Payer: Medicare Other | Admitting: Nurse Practitioner

## 2020-12-04 ENCOUNTER — Telehealth: Payer: Self-pay | Admitting: Nurse Practitioner

## 2020-12-04 ENCOUNTER — Other Ambulatory Visit: Payer: Self-pay

## 2020-12-04 NOTE — Telephone Encounter (Signed)
I called Ms. Fiola to confirm PC f/u visit, Ms. Veracruz endorses she has PNA and wishes to reschedule. Ms. Debnam endorses she starting to feel better but needs to rest. Rescheduled per request

## 2020-12-07 ENCOUNTER — Telehealth: Payer: Self-pay | Admitting: *Deleted

## 2020-12-07 NOTE — Telephone Encounter (Signed)
Oral swab drug screen was consistent for prescribed medications.  ?

## 2020-12-10 DIAGNOSIS — R0600 Dyspnea, unspecified: Secondary | ICD-10-CM | POA: Diagnosis not present

## 2020-12-10 DIAGNOSIS — J479 Bronchiectasis, uncomplicated: Secondary | ICD-10-CM | POA: Diagnosis not present

## 2020-12-25 DIAGNOSIS — H25011 Cortical age-related cataract, right eye: Secondary | ICD-10-CM | POA: Diagnosis not present

## 2020-12-25 DIAGNOSIS — H25811 Combined forms of age-related cataract, right eye: Secondary | ICD-10-CM | POA: Diagnosis not present

## 2020-12-25 DIAGNOSIS — H2511 Age-related nuclear cataract, right eye: Secondary | ICD-10-CM | POA: Diagnosis not present

## 2021-01-07 ENCOUNTER — Other Ambulatory Visit: Payer: Self-pay

## 2021-01-07 ENCOUNTER — Ambulatory Visit (INDEPENDENT_AMBULATORY_CARE_PROVIDER_SITE_OTHER): Payer: Medicare Other | Admitting: Family Medicine

## 2021-01-07 ENCOUNTER — Encounter: Payer: Self-pay | Admitting: Family Medicine

## 2021-01-07 VITALS — BP 126/76 | HR 99 | Temp 98.5°F

## 2021-01-07 DIAGNOSIS — L720 Epidermal cyst: Secondary | ICD-10-CM

## 2021-01-07 DIAGNOSIS — I890 Lymphedema, not elsewhere classified: Secondary | ICD-10-CM | POA: Diagnosis not present

## 2021-01-07 MED ORDER — AMOXICILLIN-POT CLAVULANATE 500-125 MG PO TABS: 1.0000 | ORAL_TABLET | Freq: Two times a day (BID) | ORAL | 0 refills | Status: AC

## 2021-01-07 NOTE — Progress Notes (Signed)
Subjective:    Patient ID: [name], female    DOB: Jul 04, 1940, 80 y.o.   MRN: [id number]  Chief Complaint  Patient presents with   Breast Pain  Patient accompanied by her daughter.  HPI Patient was seen today for acute concern.  Pt with erythematous, painful bump on R breast x 2 wks.  Pt thought area was a bug bite, but it never resolved.  Tried rubbing EtOH and epsom salt on area.  Also notes discoloration of LEs and painful edema.  Denies fever, chills, n/v.  Unable to wear TED hose aphasic due to her skin.  Right LE edema/left.  Elevating LEs when able.  Concerned about cellulitis given prior history.  Last ABX use 2 months ago for lung infection.  Past Medical History:  Diagnosis Date   Acute on chronic diastolic (congestive) heart failure (HCC)    Anemia    iron deficiency anemia - secondary to blood loss ( chronic)    Arthritis    endstage changes bilateral knees/bilateral ankles.    Asthma    Carotid artery occlusion    Chronic fatigue    Chronic kidney disease    Closed left hip fracture (HCC)    Clotting disorder ([place])    pt denies this   Contusion of left knee    due to fall 1/14.   COPD (chronic obstructive pulmonary disease) (HCC)    pulmonary fibrosis   Depression, reactive    Diabetes mellitus    type II    Diastolic dysfunction    Difficulty in walking    Family history of heart disease    Generalized muscle weakness    Gout    High cholesterol    History of falling    Hypertension    Hypothyroidism    Interstitial lung disease ([place])    Meningioma of left sphenoid wing involving cavernous sinus ([place]) 02/17/2012   Continue diplopia, left eye pain and left headaches.     Morbid obesity ([place])    Morbid obesity ([place])    Neuromuscular disorder ([place])    diabetic neuropathy    Normal coronary arteries    cardiac catheterization performed  10/31/14   RA (rheumatoid arthritis) ([place])    has been off methotreaxte since 10/13.   Rheumatoid arthritis (HCC)     Shortness of breath    Spinal stenosis of lumbar region    Thyroid disease    Unspecified lack of coordination    URI (upper respiratory infection)     Allergies  Allergen Reactions   Codeine Swelling and Other (See Comments)    Facial swelling Chest pain and swelling in legs  Facial swelling   Infliximab Anaphylaxis    "sent me into shock" "sent me into shock"   Lisinopril Swelling    Face and neck swelling Face and neck swelling    ROS General: Denies fever, chills, night sweats, changes in weight, changes in appetite HEENT: Denies headaches, ear pain, changes in vision, rhinorrhea, sore throat CV: Denies CP, palpitations, SOB, orthopnea  + b/l LE edema Pulm: Denies SOB, cough, wheezing GI: Denies abdominal pain, nausea, vomiting, diarrhea, constipation GU: Denies dysuria, hematuria, frequency, vaginal discharge Msk: Denies muscle cramps, joint pains Neuro: Denies weakness, numbness, tingling Skin: Denies rashes, bruising  + painful bump on right breast Psych: Denies depression, anxiety, hallucinations     Objective:    Blood pressure 126/76, pulse 99, temperature 98.5 F (36.9 C), temperature source Oral, SpO2 91 %.  Gen. Pleasant,  well-nourished, in no distress, normal affect   HEENT: [place]/AT, face symmetric, conjunctiva clear, no scleral icterus, PERRLA, EOMI, nares patent without drainage, pharynx without erythema or exudate. Neck: No JVD, no thyromegaly, no carotid bruits Lungs: no accessory muscle use, CTAB, no wheezes or rales Cardiovascular: RRR, no m/r/g, trace edema in bilateral LEs. Musculoskeletal: No deformities, no cyanosis or clubbing, normal tone Neuro:  A&Ox3, CN II-XII intact, sitting in transport wheelchair, gait not assessed Skin:  Warm, no lesions/ rash.  Hyperpigmented hypertrophy bilateral LEs left> right.  No increased warmth or erythema of LEs.  Medial upper breast with 1.5 cm area of fluctuance hyperpigmented cyst tract present.  No  drainage expressed from the area.  I&D performed.   Wt Readings from Last 3 Encounters:  11/26/20 236 lb 6.4 oz (107.2 kg)  11/22/20 236 lb 6.4 oz (107.2 kg)  10/22/20 233 lb 12.8 oz (106.1 kg)    Lab Results  Component Value Date   WBC 8.5 11/08/2020   HGB 12.1 11/08/2020   HCT 37.6 11/08/2020   PLT 145.0 (L) 11/08/2020   GLUCOSE 124 (H) 11/08/2020   CHOL 136 09/26/2020   TRIG 283.0 (H) 09/26/2020   HDL 40.00 09/26/2020   LDLDIRECT 59.0 09/26/2020   LDLCALC 75 09/22/2019   ALT 22 09/26/2020   AST 15 09/26/2020   NA 145 11/08/2020   K 3.6 11/08/2020   CL 103 11/08/2020   CREATININE 0.96 11/08/2020   BUN 21 11/08/2020   CO2 34 (H) 11/08/2020   TSH 3.55 09/26/2020   INR 1.05 11/29/2016   HGBA1C 5.6 09/26/2020    Incision and Drainage Procedure Note  Pre-operative Diagnosis: Cyst  Post-operative Diagnosis: same  Indications: Pain  Anesthesia: 2% lidocaine with epinephrine  Procedure Details  The procedure, risks and complications have been discussed in detail (including, but not limited to airway compromise, infection, bleeding) with the patient, and the patient has signed consent to the procedure.  The skin was sterilely prepped and draped over the affected area in the usual fashion. After adequate local anesthesia, I&D with a #11 blade was performed on the right medial upper breast. Purulent sebaceous, clumpy debris/drainage: present.  Cyst sac removed with a small piece remaining.  Unable to grasp piece with forceps or curved hemostats as it kept retracting back into the incision.  Incision packed with plain sterile packing. The patient was observed until stable.  EBL: Minimal cc's  Drains: None  Condition: Tolerated procedure well and Stable   Complications: Small piece of cyst sac remained .   Assessment/Plan:  Epidermal cyst  -Consent obtained.  I&D performed.  Incision packed with plain sterile packing.  Patient tolerated procedure  well. -Discussed S/S of infection. -Patient can remove packing on 8/24 or 8/25. -Given handout - Plan: amoxicillin-clavulanate (AUGMENTIN) 500-125 MG tablet  Lymphedema -Chronic lymphedema skin changes noted. -Discussed S/S of infection pt with history of cellulitis.  Continue to monitor lateral LLE. -Continue supportive care including elevating LEs -Is unable to wear TED hose/compression socks 2/2 discomfort consider Ace bandages. -Given precautions  F/u as needed  Grier Mitts, MD

## 2021-01-16 ENCOUNTER — Emergency Department (HOSPITAL_COMMUNITY): Payer: Medicare Other

## 2021-01-16 ENCOUNTER — Inpatient Hospital Stay (HOSPITAL_COMMUNITY)
Admission: EM | Admit: 2021-01-16 | Discharge: 2021-01-20 | DRG: 871 | Disposition: A | Discharge status: Home Health | Payer: Medicare Other | Attending: Internal Medicine | Admitting: Internal Medicine

## 2021-01-16 DIAGNOSIS — K529 Noninfective gastroenteritis and colitis, unspecified: Secondary | ICD-10-CM | POA: Diagnosis not present

## 2021-01-16 DIAGNOSIS — A419 Sepsis, unspecified organism: Principal | ICD-10-CM | POA: Diagnosis present

## 2021-01-16 DIAGNOSIS — J8410 Pulmonary fibrosis, unspecified: Secondary | ICD-10-CM | POA: Diagnosis present

## 2021-01-16 DIAGNOSIS — K5150 Left sided colitis without complications: Secondary | ICD-10-CM | POA: Diagnosis present

## 2021-01-16 DIAGNOSIS — N179 Acute kidney failure, unspecified: Secondary | ICD-10-CM | POA: Diagnosis present

## 2021-01-16 DIAGNOSIS — Z8249 Family history of ischemic heart disease and other diseases of the circulatory system: Secondary | ICD-10-CM | POA: Diagnosis not present

## 2021-01-16 DIAGNOSIS — Z885 Allergy status to narcotic agent status: Secondary | ICD-10-CM

## 2021-01-16 DIAGNOSIS — Z7982 Long term (current) use of aspirin: Secondary | ICD-10-CM

## 2021-01-16 DIAGNOSIS — I5032 Chronic diastolic (congestive) heart failure: Secondary | ICD-10-CM | POA: Diagnosis present

## 2021-01-16 DIAGNOSIS — R000 Tachycardia, unspecified: Secondary | ICD-10-CM | POA: Diagnosis not present

## 2021-01-16 DIAGNOSIS — Z79899 Other long term (current) drug therapy: Secondary | ICD-10-CM

## 2021-01-16 DIAGNOSIS — G894 Chronic pain syndrome: Secondary | ICD-10-CM | POA: Diagnosis present

## 2021-01-16 DIAGNOSIS — N200 Calculus of kidney: Secondary | ICD-10-CM | POA: Diagnosis present

## 2021-01-16 DIAGNOSIS — R4182 Altered mental status, unspecified: Secondary | ICD-10-CM | POA: Diagnosis not present

## 2021-01-16 DIAGNOSIS — N281 Cyst of kidney, acquired: Secondary | ICD-10-CM | POA: Diagnosis present

## 2021-01-16 DIAGNOSIS — J9611 Chronic respiratory failure with hypoxia: Secondary | ICD-10-CM | POA: Diagnosis present

## 2021-01-16 DIAGNOSIS — R918 Other nonspecific abnormal finding of lung field: Secondary | ICD-10-CM | POA: Diagnosis not present

## 2021-01-16 DIAGNOSIS — S72002A Fracture of unspecified part of neck of left femur, initial encounter for closed fracture: Secondary | ICD-10-CM | POA: Diagnosis not present

## 2021-01-16 DIAGNOSIS — M109 Gout, unspecified: Secondary | ICD-10-CM | POA: Diagnosis present

## 2021-01-16 DIAGNOSIS — I110 Hypertensive heart disease with heart failure: Secondary | ICD-10-CM | POA: Diagnosis present

## 2021-01-16 DIAGNOSIS — I712 Thoracic aortic aneurysm, without rupture: Secondary | ICD-10-CM | POA: Diagnosis not present

## 2021-01-16 DIAGNOSIS — I2510 Atherosclerotic heart disease of native coronary artery without angina pectoris: Secondary | ICD-10-CM | POA: Diagnosis not present

## 2021-01-16 DIAGNOSIS — Z841 Family history of disorders of kidney and ureter: Secondary | ICD-10-CM

## 2021-01-16 DIAGNOSIS — E039 Hypothyroidism, unspecified: Secondary | ICD-10-CM | POA: Diagnosis present

## 2021-01-16 DIAGNOSIS — M069 Rheumatoid arthritis, unspecified: Secondary | ICD-10-CM | POA: Diagnosis present

## 2021-01-16 DIAGNOSIS — Z9981 Dependence on supplemental oxygen: Secondary | ICD-10-CM

## 2021-01-16 DIAGNOSIS — Z20822 Contact with and (suspected) exposure to covid-19: Secondary | ICD-10-CM | POA: Diagnosis present

## 2021-01-16 DIAGNOSIS — R6520 Severe sepsis without septic shock: Secondary | ICD-10-CM

## 2021-01-16 DIAGNOSIS — E860 Dehydration: Secondary | ICD-10-CM | POA: Diagnosis present

## 2021-01-16 DIAGNOSIS — E1140 Type 2 diabetes mellitus with diabetic neuropathy, unspecified: Secondary | ICD-10-CM | POA: Diagnosis present

## 2021-01-16 DIAGNOSIS — G9341 Metabolic encephalopathy: Secondary | ICD-10-CM | POA: Diagnosis present

## 2021-01-16 DIAGNOSIS — M19072 Primary osteoarthritis, left ankle and foot: Secondary | ICD-10-CM | POA: Diagnosis present

## 2021-01-16 DIAGNOSIS — Z66 Do not resuscitate: Secondary | ICD-10-CM | POA: Diagnosis present

## 2021-01-16 DIAGNOSIS — E872 Acidosis, unspecified: Secondary | ICD-10-CM | POA: Diagnosis present

## 2021-01-16 DIAGNOSIS — J479 Bronchiectasis, uncomplicated: Secondary | ICD-10-CM | POA: Diagnosis present

## 2021-01-16 DIAGNOSIS — Z7952 Long term (current) use of systemic steroids: Secondary | ICD-10-CM

## 2021-01-16 DIAGNOSIS — J189 Pneumonia, unspecified organism: Secondary | ICD-10-CM

## 2021-01-16 DIAGNOSIS — Z90710 Acquired absence of both cervix and uterus: Secondary | ICD-10-CM

## 2021-01-16 DIAGNOSIS — I517 Cardiomegaly: Secondary | ICD-10-CM | POA: Diagnosis not present

## 2021-01-16 DIAGNOSIS — M19071 Primary osteoarthritis, right ankle and foot: Secondary | ICD-10-CM | POA: Diagnosis present

## 2021-01-16 DIAGNOSIS — I890 Lymphedema, not elsewhere classified: Secondary | ICD-10-CM | POA: Diagnosis present

## 2021-01-16 DIAGNOSIS — M170 Bilateral primary osteoarthritis of knee: Secondary | ICD-10-CM | POA: Diagnosis present

## 2021-01-16 DIAGNOSIS — E785 Hyperlipidemia, unspecified: Secondary | ICD-10-CM | POA: Diagnosis present

## 2021-01-16 DIAGNOSIS — J9859 Other diseases of mediastinum, not elsewhere classified: Secondary | ICD-10-CM | POA: Diagnosis not present

## 2021-01-16 DIAGNOSIS — Z79891 Long term (current) use of opiate analgesic: Secondary | ICD-10-CM

## 2021-01-16 DIAGNOSIS — J188 Other pneumonia, unspecified organism: Secondary | ICD-10-CM | POA: Diagnosis not present

## 2021-01-16 DIAGNOSIS — Z833 Family history of diabetes mellitus: Secondary | ICD-10-CM

## 2021-01-16 DIAGNOSIS — I700 Atherosclerosis of aorta: Secondary | ICD-10-CM | POA: Diagnosis not present

## 2021-01-16 DIAGNOSIS — Z86011 Personal history of benign neoplasm of the brain: Secondary | ICD-10-CM

## 2021-01-16 DIAGNOSIS — E7800 Pure hypercholesterolemia, unspecified: Secondary | ICD-10-CM | POA: Diagnosis present

## 2021-01-16 DIAGNOSIS — Z6841 Body Mass Index (BMI) 40.0 and over, adult: Secondary | ICD-10-CM

## 2021-01-16 DIAGNOSIS — Z79890 Hormone replacement therapy (postmenopausal): Secondary | ICD-10-CM

## 2021-01-16 DIAGNOSIS — Z9181 History of falling: Secondary | ICD-10-CM

## 2021-01-16 DIAGNOSIS — Z888 Allergy status to other drugs, medicaments and biological substances status: Secondary | ICD-10-CM

## 2021-01-16 LAB — I-STAT ARTERIAL BLOOD GAS, ED
Acid-Base Excess: 5 mmol/L — ABNORMAL HIGH (ref 0.0–2.0)
Bicarbonate: 30.5 mmol/L — ABNORMAL HIGH (ref 20.0–28.0)
Calcium, Ion: 0.99 mmol/L — ABNORMAL LOW (ref 1.15–1.40)
HCT: 37 % (ref 36.0–46.0)
Hemoglobin: 12.6 g/dL (ref 12.0–15.0)
O2 Saturation: 97 %
Patient temperature: 98.61
Potassium: 3.5 mmol/L (ref 3.5–5.1)
Sodium: 139 mmol/L (ref 135–145)
TCO2: 32 mmol/L (ref 22–32)
pCO2 arterial: 49.9 mmHg — ABNORMAL HIGH (ref 32.0–48.0)
pH, Arterial: 7.395 (ref 7.350–7.450)
pO2, Arterial: 91 mmHg (ref 83.0–108.0)

## 2021-01-16 LAB — LACTIC ACID, PLASMA
Lactic Acid, Venous: 3.4 mmol/L (ref 0.5–1.9)
Lactic Acid, Venous: 1.3 mmol/L (ref 0.5–1.9)

## 2021-01-16 LAB — CBC WITH DIFFERENTIAL/PLATELET
Abs Immature Granulocytes: 0.1 10*3/uL — ABNORMAL HIGH (ref 0.00–0.07)
Basophils Absolute: 0 10*3/uL (ref 0.0–0.1)
Basophils Relative: 0 %
Eosinophils Absolute: 0.2 10*3/uL (ref 0.0–0.5)
Eosinophils Relative: 2 %
HCT: 45.7 % (ref 36.0–46.0)
Hemoglobin: 13.5 g/dL (ref 12.0–15.0)
Immature Granulocytes: 1 %
Lymphocytes Relative: 23 %
Lymphs Abs: 2.9 10*3/uL (ref 0.7–4.0)
MCH: 29.9 pg (ref 26.0–34.0)
MCHC: 29.5 g/dL — ABNORMAL LOW (ref 30.0–36.0)
MCV: 101.1 fL — ABNORMAL HIGH (ref 80.0–100.0)
Monocytes Absolute: 1.2 10*3/uL — ABNORMAL HIGH (ref 0.1–1.0)
Monocytes Relative: 9 %
Neutro Abs: 8.2 10*3/uL — ABNORMAL HIGH (ref 1.7–7.7)
Neutrophils Relative %: 65 %
Platelets: 161 10*3/uL (ref 150–400)
RBC: 4.52 MIL/uL (ref 3.87–5.11)
RDW: 14.3 % (ref 11.5–15.5)
WBC: 12.6 10*3/uL — ABNORMAL HIGH (ref 4.0–10.5)
nRBC: 0 % (ref 0.0–0.2)

## 2021-01-16 LAB — PROTIME-INR
INR: 1 (ref 0.8–1.2)
Prothrombin Time: 13.3 seconds (ref 11.4–15.2)

## 2021-01-16 LAB — I-STAT CHEM 8, ED
BUN: 27 mg/dL — ABNORMAL HIGH (ref 8–23)
Calcium, Ion: 0.96 mmol/L — ABNORMAL LOW (ref 1.15–1.40)
Chloride: 99 mmol/L (ref 98–111)
Creatinine, Ser: 1.6 mg/dL — ABNORMAL HIGH (ref 0.44–1.00)
Glucose, Bld: 167 mg/dL — ABNORMAL HIGH (ref 70–99)
HCT: 44 % (ref 36.0–46.0)
Hemoglobin: 15 g/dL (ref 12.0–15.0)
Potassium: 4 mmol/L (ref 3.5–5.1)
Sodium: 139 mmol/L (ref 135–145)
TCO2: 32 mmol/L (ref 22–32)

## 2021-01-16 LAB — COMPREHENSIVE METABOLIC PANEL
ALT: 31 U/L (ref 0–44)
AST: 21 U/L (ref 15–41)
Albumin: 3 g/dL — ABNORMAL LOW (ref 3.5–5.0)
Alkaline Phosphatase: 74 U/L (ref 38–126)
Anion gap: 8 (ref 5–15)
BUN: 21 mg/dL (ref 8–23)
CO2: 32 mmol/L (ref 22–32)
Calcium: 8.2 mg/dL — ABNORMAL LOW (ref 8.9–10.3)
Chloride: 100 mmol/L (ref 98–111)
Creatinine, Ser: 1.61 mg/dL — ABNORMAL HIGH (ref 0.44–1.00)
GFR, Estimated: 32 mL/min — ABNORMAL LOW (ref >60)
Glucose, Bld: 177 mg/dL — ABNORMAL HIGH (ref 70–99)
Potassium: 3.9 mmol/L (ref 3.5–5.1)
Sodium: 140 mmol/L (ref 135–145)
Total Bilirubin: 0.9 mg/dL (ref 0.3–1.2)
Total Protein: 5.7 g/dL — ABNORMAL LOW (ref 6.5–8.1)

## 2021-01-16 LAB — URINALYSIS, ROUTINE W REFLEX MICROSCOPIC
Glucose, UA: NEGATIVE mg/dL
Hgb urine dipstick: NEGATIVE
Ketones, ur: NEGATIVE mg/dL
Nitrite: NEGATIVE
Protein, ur: NEGATIVE mg/dL
Specific Gravity, Urine: 1.025 (ref 1.005–1.030)
pH: 5 (ref 5.0–8.0)

## 2021-01-16 LAB — TROPONIN I (HIGH SENSITIVITY)
Troponin I (High Sensitivity): 18 ng/L — ABNORMAL HIGH (ref <18)
Troponin I (High Sensitivity): 16 ng/L (ref <18)

## 2021-01-16 LAB — CBG MONITORING, ED: Glucose-Capillary: 117 mg/dL — ABNORMAL HIGH (ref 70–99)

## 2021-01-16 LAB — TSH: TSH: 4.598 u[IU]/mL — ABNORMAL HIGH (ref 0.350–4.500)

## 2021-01-16 LAB — AMMONIA: Ammonia: 16 umol/L (ref 9–35)

## 2021-01-16 LAB — RESP PANEL BY RT-PCR (FLU A&B, COVID) ARPGX2
Influenza A by PCR: NEGATIVE
Influenza B by PCR: NEGATIVE
SARS Coronavirus 2 by RT PCR: NEGATIVE

## 2021-01-16 MED ORDER — SALINE SPRAY 0.65 % NA SOLN: 1.0000 | NASAL | Status: DC | PRN

## 2021-01-16 MED ORDER — ONDANSETRON HCL 4 MG PO TABS: 4.0000 mg | ORAL_TABLET | Freq: Four times a day (QID) | ORAL | Status: DC | PRN

## 2021-01-16 MED ORDER — SODIUM CHLORIDE 0.9 % IV BOLUS
1000.0000 mL | Freq: Once | INTRAVENOUS | Status: AC
  Administered 2021-01-16: 1000 mL via INTRAVENOUS

## 2021-01-16 MED ORDER — LACTATED RINGERS IV SOLN: INTRAVENOUS | Status: DC

## 2021-01-16 MED ORDER — IPRATROPIUM-ALBUTEROL 0.5-2.5 (3) MG/3ML IN SOLN: 3.0000 mL | Freq: Four times a day (QID) | RESPIRATORY_TRACT | Status: DC | PRN

## 2021-01-16 MED ORDER — IOHEXOL 350 MG/ML SOLN
70.0000 mL | Freq: Once | INTRAVENOUS | Status: AC | PRN
  Administered 2021-01-16: 70 mL via INTRAVENOUS

## 2021-01-16 MED ORDER — ONDANSETRON HCL 4 MG/2ML IJ SOLN
4.0000 mg | Freq: Four times a day (QID) | INTRAMUSCULAR | Status: DC | PRN
  Administered 2021-01-17: 4 mg via INTRAVENOUS
  Filled 2021-01-16: qty 2

## 2021-01-16 MED ORDER — LEVOTHYROXINE SODIUM 50 MCG PO TABS
50.0000 ug | ORAL_TABLET | Freq: Every day | ORAL | Status: DC
  Administered 2021-01-17 – 2021-01-20 (×4): 50 ug via ORAL
  Filled 2021-01-16 (×4): qty 1

## 2021-01-16 MED ORDER — CEFTRIAXONE SODIUM 2 G IJ SOLR
2.0000 g | INTRAMUSCULAR | Status: DC
  Administered 2021-01-16: 2 g via INTRAVENOUS
  Filled 2021-01-16: qty 20

## 2021-01-16 MED ORDER — INSULIN ASPART 100 UNIT/ML IJ SOLN
0.0000 [IU] | Freq: Three times a day (TID) | INTRAMUSCULAR | Status: DC
  Administered 2021-01-17: 2 [IU] via SUBCUTANEOUS
  Administered 2021-01-18 – 2021-01-20 (×4): 1 [IU] via SUBCUTANEOUS

## 2021-01-16 MED ORDER — ACETAMINOPHEN 650 MG RE SUPP: 650.0000 mg | Freq: Four times a day (QID) | RECTAL | Status: DC | PRN

## 2021-01-16 MED ORDER — SODIUM CHLORIDE 0.9% FLUSH
3.0000 mL | Freq: Two times a day (BID) | INTRAVENOUS | Status: DC
  Administered 2021-01-16 – 2021-01-20 (×8): 3 mL via INTRAVENOUS

## 2021-01-16 MED ORDER — HEPARIN SODIUM (PORCINE) 5000 UNIT/ML IJ SOLN
5000.0000 [IU] | Freq: Three times a day (TID) | INTRAMUSCULAR | Status: DC
  Administered 2021-01-16 – 2021-01-20 (×11): 5000 [IU] via SUBCUTANEOUS
  Filled 2021-01-16 (×11): qty 1

## 2021-01-16 MED ORDER — ACETAMINOPHEN 650 MG RE SUPP
650.0000 mg | Freq: Once | RECTAL | Status: AC
  Administered 2021-01-16: 650 mg via RECTAL
  Filled 2021-01-16: qty 1

## 2021-01-16 MED ORDER — ATORVASTATIN CALCIUM 10 MG PO TABS
10.0000 mg | ORAL_TABLET | Freq: Every evening | ORAL | Status: DC
  Administered 2021-01-16 – 2021-01-19 (×4): 10 mg via ORAL
  Filled 2021-01-16 (×4): qty 1

## 2021-01-16 MED ORDER — GUAIFENESIN ER 600 MG PO TB12
1200.0000 mg | ORAL_TABLET | Freq: Two times a day (BID) | ORAL | Status: DC
  Administered 2021-01-16 – 2021-01-20 (×8): 1200 mg via ORAL
  Filled 2021-01-16 (×8): qty 2

## 2021-01-16 MED ORDER — ALBUTEROL SULFATE (2.5 MG/3ML) 0.083% IN NEBU: 3.0000 mL | INHALATION_SOLUTION | Freq: Four times a day (QID) | RESPIRATORY_TRACT | Status: DC | PRN

## 2021-01-16 MED ORDER — SODIUM CHLORIDE 0.9 % IV SOLN
500.0000 mg | INTRAVENOUS | Status: DC
  Administered 2021-01-16: 500 mg via INTRAVENOUS
  Filled 2021-01-16: qty 500

## 2021-01-16 MED ORDER — ACETAMINOPHEN 325 MG PO TABS
650.0000 mg | ORAL_TABLET | Freq: Four times a day (QID) | ORAL | Status: DC | PRN
  Administered 2021-01-17 – 2021-01-18 (×4): 650 mg via ORAL
  Filled 2021-01-16 (×4): qty 2

## 2021-01-16 MED ORDER — LACTATED RINGERS IV SOLN: INTRAVENOUS | Status: AC

## 2021-01-16 NOTE — H&P (Signed)
History and Physical    [name] JTT:[id number] DOB: 18-Feb-1941 DOA: 01/16/2021  PCP: [name], MD  Patient coming from: Home  I have personally briefly reviewed patient's old medical records in [place]  Chief Complaint: Altered mental status  HPI: [name] is a 80 y.o. female with medical history significant for pulmonary fibrosis with bronchiectasis on chronic azithromycin, chronic respiratory failure with hypoxia, chronic diastolic CHF, rheumatoid arthritis on chronic prednisone 10 mg daily, T2DM, HTN, HLD, hypothyroidism, chronic lower extremity lymphedema, gout, morbid obesity, and chronic pain who presented to the ED for evaluation of altered mental status.  Patient states that she had a recent cyst above her right breast.  She saw her PCP on 01/07/2021 at which time an I&D was performed.  She was subsequently placed on a 7-day course of Augmentin.  Patient states that she has been having diarrhea since starting the Augmentin.  This morning (01/16/21) she went to use the restroom but cannot get up afterwards due to feeling generally weak.  She went to run some errands with family however over the course of day has been feeling progressively weaker and somnolent.  She eventually got to a point where family were unable to arouse her and instead brought her directly to the ED for further evaluation.  Patient does report chronic shortness of breath and cough productive of clear sputum which is slightly worse than her baseline.  She had felt very hot earlier today but denied any diaphoresis.  She denied any chest pain, nausea, vomiting, abdominal pain, or dysuria.  She reports chronic swelling in her lower extremities.  ED Course:  Initial vitals showed BP 84/49, pulse 126, RR 19, temp 99.3 F, SPO2 98% on 2 L O2 via [place].  Rectal temperature was 101.2 F.  Labs show WBC 12.6, hemoglobin 13.5, platelets 161,000, sodium 140, potassium 3.9, bicarb 32, BUN 21,  creatinine 1.61 (previously 0.96 on 11/08/2020), serum glucose 177, LFTs within normal limits, lactic acid 3.4.  Troponin 18, TSH 4.598.  Urinalysis showed negative nitrates, trace leukocytes, 0-5 RBC/hpf, 0-5 WBC/hpf, rare bacteria microscopy.  Blood cultures obtained and pending.  ABG showed pH 7.395, PCO2 49.9, PO2 91.  C. difficile and GI pathogen panels were ordered and pending collection.  Portable chest x-ray showed widening of mediastinum and patchy opacities in the lung bases.  Follow-up CTA chest/abdomen/pelvis was negative for acute vascular abnormality.  Peribronchovascular groundglass airspace opacities noted as well as descending colon colitis.  No findings of bowel perforation or obstruction seen.  Nonobstructive punctate right nephrolithiasis and simple renal cyst within the kidneys also noted.  CT head without contrast was negative for acute intracranial normality.  Patient was given 3 L normal saline and placed on maintenance IV fluids with LR.  She was given IV ceftriaxone and azithromycin.  The hospitalist service was consulted to admit for further evaluation and management.  Review of Systems: All systems reviewed and are negative except as documented in history of present illness above.   Past Medical History:  Diagnosis Date   Acute on chronic diastolic (congestive) heart failure (HCC)    Anemia    iron deficiency anemia - secondary to blood loss ( chronic)    Arthritis    endstage changes bilateral knees/bilateral ankles.    Asthma    Carotid artery occlusion    Chronic fatigue    Chronic kidney disease    Closed left hip fracture (HCC)    Clotting disorder ([place])  pt denies this   Contusion of left knee    due to fall 1/14.   COPD (chronic obstructive pulmonary disease) (HCC)    pulmonary fibrosis   Depression, reactive    Diabetes mellitus    type II    Diastolic dysfunction    Difficulty in walking    Family history of heart disease    Generalized  muscle weakness    Gout    High cholesterol    History of falling    Hypertension    Hypothyroidism    Interstitial lung disease ([place])    Meningioma of left sphenoid wing involving cavernous sinus ([place]) 02/17/2012   Continue diplopia, left eye pain and left headaches.     Morbid obesity ([place])    Morbid obesity ([place])    Neuromuscular disorder ([place])    diabetic neuropathy    Normal coronary arteries    cardiac catheterization performed  10/31/14   RA (rheumatoid arthritis) ([place])    has been off methotreaxte since 10/13.   Rheumatoid arthritis (HCC)    Shortness of breath    Spinal stenosis of lumbar region    Thyroid disease    Unspecified lack of coordination    URI (upper respiratory infection)     Past Surgical History:  Procedure Laterality Date   ABDOMINAL HYSTERECTOMY     BRAIN SURGERY     Gamma knife 10/13. Needs repeat spring  '14   CARDIAC CATHETERIZATION N/A 10/31/2014   Procedure: Right/Left Heart Cath and Coronary Angiography;  Surgeon: [name], MD;  Location: [facility];  Service: Cardiovascular;  Laterality: N/A;   ESOPHAGOGASTRODUODENOSCOPY (EGD) WITH PROPOFOL N/A 09/14/2014   Procedure: ESOPHAGOGASTRODUODENOSCOPY (EGD) WITH PROPOFOL;  Surgeon: [name], MD;  Location: [facility];  Service: Endoscopy;  Laterality: N/A;   INCISION AND DRAINAGE HIP Left 01/16/2017   Procedure: IRRIGATION AND DEBRIDEMENT LEFT HIP;  Surgeon: [name], MD;  Location: [facility];  Service: Orthopedics;  Laterality: Left;   INTRAMEDULLARY (IM) NAIL INTERTROCHANTERIC Left 11/29/2016   Procedure: INTRAMEDULLARY (IM) NAIL INTERTROCHANTRIC;  Surgeon: [name], MD;  Location: [facility];  Service: Orthopedics;  Laterality: Left;   OVARY SURGERY     SHOULDER SURGERY Left    TONSILLECTOMY  age 83   VIDEO BRONCHOSCOPY Bilateral 05/31/2013   Procedure: VIDEO BRONCHOSCOPY WITHOUT FLUORO;  Surgeon: Brand Males, MD;  Location: [facility];  Service:  Cardiopulmonary;  Laterality: Bilateral;   video bronscoscopy  2000   lung    Social History:  reports that she has never smoked. She has never used smokeless tobacco. She reports that she does not drink alcohol and does not use drugs.  Allergies  Allergen Reactions   Codeine Swelling and Other (See Comments)    Facial swelling Chest pain and swelling in legs  Facial swelling   Infliximab Anaphylaxis    "sent me into shock" "sent me into shock"   Lisinopril Swelling    Face and neck swelling Face and neck swelling    Family History  Problem Relation Age of Onset   Diabetes Mother    Heart attack Mother    Hypertension Father    Lung cancer Father    Diabetes Sister    Diabetes Brother    Hypertension Brother    Heart disease Brother    Heart attack Brother    Kidney cancer Brother    Uterine cancer Daughter    Breast cancer Sister    Rheum arthritis Maternal  Uncle    Gout Brother    Kidney failure Brother        x 5     Prior to Admission medications   Medication Sig Start Date End Date Taking? Authorizing Provider  acetaminophen (TYLENOL) 500 MG tablet Take 1,000 mg by mouth every 6 (six) hours as needed for moderate pain.     [provider]  albuterol (PROVENTIL HFA;VENTOLIN HFA) 108 (90 BASE) MCG/ACT inhaler Inhale 2 puffs into the lungs every 6 (six) hours as needed for wheezing or shortness of breath.    [provider]  allopurinol (ZYLOPRIM) 300 MG tablet Take 300 mg by mouth daily.    [provider]  aspirin buffered (BUFFERIN) 325 MG TABS tablet Take 325 mg by mouth daily.    [provider]  atorvastatin (LIPITOR) 20 MG tablet Take 0.5 tablets (10 mg total) by mouth every evening. 09/26/20   [name], MD  azithromycin (ZITHROMAX) 250 MG tablet Take 2 tabs on day one.  Then take 1 tab daily on days 2-7. Patient taking differently: Take 2 tabs on day one.  Then take 1 tab daily on days 2-7. 06/25/20   [name], MD  Calcium Carb-Cholecalciferol (CALCIUM + D3 PO) Take 2 tablets by mouth daily with lunch.     [provider]  cholecalciferol (VITAMIN D) 1000 UNITS tablet Take 2,000 Units by mouth daily with lunch.     [provider]  colchicine 0.6 MG tablet Take 0.6 mg by mouth daily. 06/25/16   [provider]  cyclobenzaprine (FLEXERIL) 5 MG tablet Take 1 tablet (5 mg total) by mouth at bedtime as needed for muscle spasms. 12/14/19   [name], NP  diclofenac sodium (VOLTAREN) 1 % GEL Apply 2 g topically 3 (three) times daily. Both hands and knees 02/08/19   [name], NP  DULoxetine (CYMBALTA) 60 MG capsule Take 1 capsule (60 mg total) by mouth daily. 06/29/20   [name], NP  fluticasone (FLONASE) 50 MCG/ACT nasal spray Place 2 sprays into both nostrils daily. 06/14/20   [name], MD  furosemide (LASIX) 80 MG tablet Take 1 tablet (80 mg total) by mouth daily. 11/22/20   Lorretta Harp, MD  gabapentin (NEURONTIN) 600 MG tablet Take two in the morning , Two mid- afternoon and two at bedtime 05/29/20   [name], NP  guaiFENesin (MUCINEX) 600 MG 12 hr tablet Take 1,200 mg by mouth 2 (two) times daily.    [provider]  hydroxychloroquine (PLAQUENIL) 200 MG tablet Take 200 mg by mouth daily.    [provider]  ipratropium-albuterol (DUONEB) 0.5-2.5 (3) MG/3ML SOLN Take 3 mLs by nebulization every 6 (six) hours as needed (cough/wheezing).     [provider]  leflunomide (ARAVA) 10 MG tablet Take 20 mg by mouth daily.    Brand Males, MD  leflunomide (ARAVA) 20 MG tablet Take by mouth. 08/03/20 08/03/21  [provider]  levothyroxine (SYNTHROID) 50 MCG tablet Take 1 tablet (50 mcg total) by mouth daily. 09/26/20   [name], MD  linaclotide [facility]) 72 MCG capsule Take 1 capsule (72 mcg total) by mouth daily before breakfast. 10/09/20   [name], MD  metolazone (ZAROXOLYN) 2.5 MG  tablet TAKE 1 TABLET BY MOUTH DAILY AS NEEDED FOR 2 DAYS FOR WEIGHT ABOVE 255 POUNDS 03/30/20   Lorretta Harp, MD  metoprolol succinate (TOPROL XL) 25 MG 24 hr  tablet Take 3 tablets (75 mg total) by mouth daily. 11/22/20   Lorretta Harp, MD  morphine (MS CONTIN) 15 MG 12 hr tablet Take 1 tablet (15 mg total) by mouth every 12 (twelve) hours. 11/26/20   [name], NP  morphine (MSIR) 15 MG tablet One tablet twice a day as needed for break through pain. 11/26/20   [name], NP  Multiple Vitamin (MULTIVITAMIN WITH MINERALS) TABS Take 1 tablet by mouth daily.     [provider]  olmesartan (BENICAR) 40 MG tablet Take 1 tablet (40 mg total) by mouth daily. 09/26/20   [name], MD  OXYGEN-HELIUM IN Inhale 2 L into the lungs at bedtime. And on exertion    [provider]  pantoprazole (PROTONIX) 40 MG tablet take 1 tablet by mouth once daily MINUTES BEFORE 1ST MEAL OF THE DAY 09/26/20   [name], MD  potassium chloride SA (KLOR-CON) 20 MEQ tablet TAKE 1 TABLET(20 MEQ) BY MOUTH DAILY 04/30/20   [name], MD  predniSONE (DELTASONE) 10 MG tablet Take 4 tabs po daily x 2 days; then 3 tabs for 2 days; then 2 tabs for 2 days; then 1 tab for 2 days 11/22/20   Martyn Ehrich, NP  predniSONE (DELTASONE) 5 MG tablet Take 10 mg by mouth every morning.  06/20/15   [provider]  Semaglutide,0.25 or 0.5MG/DOS, (OZEMPIC, 0.25 OR 0.5 MG/DOSE,) 2 MG/1.5ML SOPN Inject 0.5 mg into the skin once a week. 09/26/20   [name], MD  sodium chloride (OCEAN) 0.65 % SOLN nasal spray Place 1 spray into both nostrils as needed for congestion.    [provider]  Spacer/Aero-Holding Chambers (AEROCHAMBER MV) inhaler Use as instructed 06/10/13   Melvenia Needles, NP    Physical Exam: Vitals:   01/16/21 1755 01/16/21 1756 01/16/21 1800 01/16/21 1815  BP: 92/76  96/63 (!) 95/53  Pulse: (!) 117 (!) 117 (!) 119   Resp: _0 Temp:       TempSrc:      SpO2: 94% 96% 94%    Constitutional: Morbidly obese woman resting in bed NAD, calm, comfortable Eyes: PERRL, lids and conjunctivae normal ENMT: Mucous membranes are dry. Posterior pharynx clear of any exudate or lesions.Normal dentition.  Neck: normal, supple, no masses. Respiratory: Scattered inspiratory crackles. Normal respiratory effort. No accessory muscle use.  Cardiovascular: Tachycardic, no murmurs / rubs / gallops.  +1 bilateral lower extremity edema. 2+ pedal pulses. Abdomen: Tender to palpation left upper and lower abdomen.  No masses palpated. No hepatosplenomegaly. Bowel sounds positive.  Musculoskeletal: no clubbing / cyanosis. No joint deformity upper and lower extremities. Good ROM, no contractures. Normal muscle tone.  Skin: no rashes, lesions, ulcers. No induration Neurologic: CN 2-12 grossly intact. Sensation intact. Strength 5/5 in all 4.  Psychiatric: Normal judgment and insight. Alert and oriented x 3. Normal mood.    Labs on Admission: I have personally reviewed following labs and imaging studies  CBC: Recent Labs  Lab 01/16/21 1535 01/16/21 1557 01/16/21 1612  WBC 12.6*  --   --   NEUTROABS 8.2*  --   --   HGB 13.5 15.0 12.6  HCT 45.7 44.0 37.0  MCV 101.1*  --   --   PLT 161  --   --    Basic Metabolic Panel: Recent Labs  Lab 01/16/21 1535 01/16/21 1557 01/16/21 1612  NA 140 139 139  K 3.9 4.0  3.5  CL 100 99  --   CO2 32  --   --   GLUCOSE 177* 167*  --   BUN 21 27*  --   CREATININE 1.61* 1.60*  --   CALCIUM 8.2*  --   --    GFR: CrCl cannot be calculated (Unknown ideal weight.). Liver Function Tests: Recent Labs  Lab 01/16/21 1535  AST 21  ALT 31  ALKPHOS 74  BILITOT 0.9  PROT 5.7*  ALBUMIN 3.0*   No results for input(s): LIPASE, AMYLASE in the last 168 hours. No results for input(s): AMMONIA in the last 168 hours. Coagulation Profile: Recent Labs  Lab 01/16/21 1535  INR 1.0   Cardiac Enzymes: No results for  input(s): CKTOTAL, CKMB, CKMBINDEX, TROPONINI in the last 168 hours. BNP (last 3 results) No results for input(s): PROBNP in the last 8760 hours. HbA1C: No results for input(s): HGBA1C in the last 72 hours. CBG: No results for input(s): GLUCAP in the last 168 hours. Lipid Profile: No results for input(s): CHOL, HDL, LDLCALC, TRIG, CHOLHDL, LDLDIRECT in the last 72 hours. Thyroid Function Tests: Recent Labs    01/16/21 1537  TSH 4.598*   Anemia Panel: No results for input(s): VITAMINB12, FOLATE, FERRITIN, TIBC, IRON, RETICCTPCT in the last 72 hours. Urine analysis:    Component Value Date/Time   COLORURINE AMBER (A) 01/16/2021 1535   APPEARANCEUR HAZY (A) 01/16/2021 1535   LABSPEC 1.025 01/16/2021 1535   PHURINE 5.0 01/16/2021 1535   GLUCOSEU NEGATIVE 01/16/2021 1535   HGBUR NEGATIVE 01/16/2021 1535   BILIRUBINUR SMALL (A) 01/16/2021 1535   BILIRUBINUR neg 11/15/2019 1406   KETONESUR NEGATIVE 01/16/2021 1535   PROTEINUR NEGATIVE 01/16/2021 1535   UROBILINOGEN 0.2 11/28/2019 1424   NITRITE NEGATIVE 01/16/2021 1535   LEUKOCYTESUR TRACE (A) 01/16/2021 1535    Radiological Exams on Admission: CT Head Wo Contrast  Result Date: 01/16/2021 CLINICAL DATA:  Mental status change. Unknown cause. has been unresponsive and out of oxygen for an hour. Pt responsive to pain EXAM: CT HEAD WITHOUT CONTRAST TECHNIQUE: Contiguous axial images were obtained from the base of the skull through the vertex without intravenous contrast. COMPARISON:  MRI head 02/28/2014 BRAIN: BRAIN Patchy and confluent areas of decreased attenuation are noted throughout the deep and periventricular white matter of the cerebral hemispheres bilaterally, compatible with chronic microvascular ischemic disease. No evidence of large-territorial acute infarction. No parenchymal hemorrhage. No mass lesion. No extra-axial collection. No mass effect or midline shift. No hydrocephalus. Basilar cisterns are patent. Vascular: No  hyperdense vessel. Skull: No acute fracture or focal lesion. Sinuses/Orbits: Paranasal sinuses and mastoid air cells are clear. The orbits are unremarkable. Other: None. IMPRESSION: No acute intracranial abnormality. Electronically Signed   By: Iven Finn M.D.   On: 01/16/2021 18:18   DG Chest Portable 1 View  Result Date: 01/16/2021 CLINICAL DATA:  Unresponsive EXAM: PORTABLE CHEST 1 VIEW COMPARISON:  Chest radiograph 11/22/2020 FINDINGS: The heart is mildly enlarged. The mediastinum is prominent, increased from prior. Lung volumes are low. There are patchy opacities in the lung bases, right more than left. There is no significant pleural effusion. There is no pneumothorax. There is no acute osseous abnormality. IMPRESSION: 1. Widening of the mediastinum, likely exaggerated by low lung volumes and AP technique, though aortic aneurysm or dissection are possibility. Recommend CTA chest for further evaluation. 2. Patchy opacities in the lung bases may reflect atelectasis or infection. These results were called by telephone at the time of interpretation on  01/16/2021 at 4:47 pm to provider JULIE HAVILAND , who verbally acknowledged these results. Electronically Signed   By: Valetta Mole M.D.   On: 01/16/2021 16:48   CT Angio Chest/Abd/Pel for Dissection W and/or Wo Contrast  Result Date: 01/16/2021 CLINICAL DATA:  Thoracic aortic aneurysm (TAA) suspected widened mediastinum on CXR. Unresponsive. EXAM: CT ANGIOGRAPHY CHEST, ABDOMEN AND PELVIS TECHNIQUE: Non-contrast CT of the chest was initially obtained. Multidetector CT imaging through the chest, abdomen and pelvis was performed using the standard protocol during bolus administration of intravenous contrast. Multiplanar reconstructed images and MIPs were obtained and reviewed to evaluate the vascular anatomy. CONTRAST:  36m OMNIPAQUE IOHEXOL 350 MG/ML SOLN COMPARISON:  Chest x-ray 01/16/2021, chest x-ray 11/22/2020, CT chest 04/21/2019 FINDINGS: CTA CHEST  FINDINGS Cardiovascular: Preferential opacification of the thoracic aorta. No evidence of thoracic aortic aneurysm or dissection. Mild atherosclerotic plaque of the thoracic aorta. At least 2 vessel coronary artery calcifications. Normal heart size. No significant pericardial effusion. The main pulmonary artery is normal in caliber. No central pulmonary embolus. Mediastinum/Nodes: Prominent but nonenlarged mediastinal lymph nodes. No enlarged mediastinal, hilar, or axillary lymph nodes. Thyroid gland, trachea, and esophagus demonstrate no significant findings. Lungs/Pleura: Expiratory phase of respiration with mosaic attenuation of the lungs. Diffuse peribronchovascular ground-glass airspace opacity. Similar-appearing underlying trace peripheral reticulations. No focal consolidation. No pulmonary nodule. No pulmonary mass. No pleural effusion. No pneumothorax. Musculoskeletal: No chest wall abnormality. No suspicious lytic or blastic osseous lesions. No acute displaced fracture. Multilevel degenerative changes of the spine. Review of the MIP images confirms the above findings. CTA ABDOMEN AND PELVIS FINDINGS VASCULAR Aorta: Mild atherosclerotic plaque. Normal caliber aorta without aneurysm, dissection, vasculitis or significant stenosis. Celiac: Patent without evidence of aneurysm, dissection, vasculitis or significant stenosis. SMA: Mild atherosclerotic plaque. Patent without evidence of aneurysm, dissection, vasculitis or significant stenosis. Renals: Mild atherosclerotic plaque. Both renal arteries are patent without evidence of aneurysm, dissection, vasculitis, fibromuscular dysplasia or significant stenosis. IMA: Patent without evidence of aneurysm, dissection, vasculitis or significant stenosis. Inflow: Mild atherosclerotic plaque. Patent without evidence of aneurysm, dissection, vasculitis or significant stenosis. Veins: No obvious venous abnormality within the limitations of this arterial phase study. Review  of the MIP images confirms the above findings. NON-VASCULAR Hepatobiliary: No focal liver abnormality. No gallstones, gallbladder wall thickening, or pericholecystic fluid. No biliary dilatation. Pancreas: No focal lesion. Normal pancreatic contour. No surrounding inflammatory changes. No main pancreatic ductal dilatation. Spleen: Normal in size without focal abnormality. Adrenals/Urinary Tract: No adrenal nodule bilaterally. Nonspecific trace bilateral perinephric stranding. Bilateral kidneys enhance symmetrically. Parapelvic cyst versus prominent renal pelvis on the right. Fluid density lesions within the kidneys likely represent simple renal cysts measuring up to 6 cm on the right. Couple of punctate calcifications noted within the right kidney. No hydronephrosis. No hydroureter. The urinary bladder is unremarkable. Stomach/Bowel: Stomach is within normal limits. No evidence of small bowel wall thickening or dilatation. No large bowel dilatation. Circumferential bowel wall thickening of the descending colon. Trace pericolonic fat stranding. Appendix appears normal. Lymphatic: No lymphadenopathy. Reproductive: Status post hysterectomy. No adnexal masses. Other: No intraperitoneal free fluid. No intraperitoneal free gas. No organized fluid collection. Musculoskeletal: No abdominal wall hernia or abnormality. No suspicious lytic or blastic osseous lesions. No acute displaced fracture. Multilevel severe degenerative changes of the spine. Partially visualized proximal left femoral fracture status post surgical hardware fixation. Review of the MIP images confirms the above findings. IMPRESSION: 1. No acute vascular abnormality. Findings noted on chest x-ray likely due to patient  rotation and positioning. 2. Diffuse peribronchovascular ground-glass airspace opacities may represent pulmonary edema versus infection/inflammation (such as aspiration pneumonia). 3. Descending colon colitis. Differential diagnosis for  etiology includes infection, inflammation, ischemia. No findings of associated bowel perforation or obstruction. Recommend colonoscopy status post treatment and status post complete resolution of inflammatory changes to exclude an underlying lesion. 4. Other imaging findings of potential clinical significance: Nonobstructive punctate right nephrolithiasis. Simple renal cysts within the kidneys. Electronically Signed   By: Iven Finn M.D.   On: 01/16/2021 18:16    EKG: Personally reviewed. Sinus tachycardia, rate 127, low voltage, LAFB.  Rate is faster when compared to prior.  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 female with medical history significant for pulmonary fibrosis with bronchiectasis on chronic azithromycin, chronic respiratory failure with hypoxia, chronic diastolic CHF, rheumatoid arthritis on chronic prednisone 10 mg daily, T2DM, HTN, HLD, hypothyroidism, gout, morbid obesity, and chronic pain who is admitted with severe sepsis.  Severe sepsis: Patient presenting with tachycardia, hypotension, fever, leukocytosis, lactic acidosis, and AKI.  Suspected descending colitis as source of infection.  Reports recent diarrhea and antibiotic use.  Received IV ceftriaxone and azithromycin while in the ED. -Continue IV fluid hydration overnight, monitor strict I/O's -Follow blood cultures, GI pathogen/C. difficile panels -Will hold further antibiotics for now pending C. difficile results  Pulmonary fibrosis with chronic bronchiectasis/chronic  respiratory failure with hypoxia: Chronic issue, appears to be at baseline.  Saturating well on home 2 L O2 via [place]. -Continue supplemental oxygen as needed -Continue albuterol and DuoNebs as needed -Continue incentive spirometer, flutter valve, mucinex -Holding home azithromycin for now as above  Acute kidney injury: Likely prerenal due to GI losses.  Continue gentle IV fluid hydration overnight and repeat labs in AM.  Holding home Lasix, olmesartan.  Acute metabolic encephalopathy: Secondary to severe sepsis,.  resolved after IV fluid hydration.  Generalized weakness: Request PT/OT eval.  Chronic diastolic CHF: Last EF 92-92%.  Receiving gentle IV fluid hydration in setting of severe sepsis as above.  Monitor strict I/O's, daily weights.  Rheumatoid arthritis: Holding home Plaquenil, leflunomide, chronic prednisone 10 mg daily for now.  Type 2 diabetes: Holding home medications and placed on sensitive SSI.  Hypertension: Antihypertensives on hold due to hypotension on arrival.  Hypothyroidism: Continue Synthroid.  Hyperlipidemia: Continue atorvastatin.  Chronic pain: Holding home morphine for now in setting of colitis and hypotension.   DVT prophylaxis: Subcutaneous heparin Code Status: DNR, confirmed with patient on admission Family Communication: Discussed with patient's daughter at bedside Disposition Plan: From home and likely discharge to home pending clinical progress Consults called: None Level of care: Progressive Admission status:  Status is: Inpatient  Remains inpatient appropriate because:Hemodynamically unstable, IV treatments appropriate due to intensity of illness or inability to take PO, and Inpatient level of care appropriate due to severity of illness  Dispo: The patient is from: Home              Anticipated d/c is to: Home  Patient currently is not medically stable to d/c.   Difficult to place patient No  Zada Finders MD [facility]  If 7PM-7AM, please contact night-coverage [email or web address]  01/16/2021, 7:01 PM

## 2021-01-16 NOTE — ED Notes (Signed)
RT called to obtain ABG

## 2021-01-16 NOTE — ED Triage Notes (Signed)
Pt arrives POV with family who report she has been unresponsive and out of oxygen for an hour. Pt responsive to pain.

## 2021-01-16 NOTE — Sepsis Progress Note (Signed)
Elink tracking the code sepsis.

## 2021-01-16 NOTE — ED Notes (Signed)
MD notified of BP 87/52. 2nd NS bolus hung.

## 2021-01-16 NOTE — ED Provider Notes (Signed)
[facility] EMERGENCY DEPARTMENT Provider Note   CSN: [id number] Arrival date & time: 01/16/21  1533     History Chief Complaint  Patient presents with   Altered Mental Status    [name] is a 80 y.o. female.  Pt presents to the ED today with AMS.  Pt brought in by her daughter POV to the ambulance bay.  The daughter said pt was a little confused when she woke up, but wanted to do some errands today.  Pt wears 2 L oxygen, but increased it to 3L because she was moving around.  Her daughter said her oxygen ran out and by the time, she got back to the house, her O2 sat was 84%.  The daughter hooked her back up the the oxygen, but it was still low and pt was not talking to daughter, so she brought her here.  Pt is unable to give any hx.      Past Medical History:  Diagnosis Date   Acute on chronic diastolic (congestive) heart failure (HCC)    Anemia    iron deficiency anemia - secondary to blood loss ( chronic)    Arthritis    endstage changes bilateral knees/bilateral ankles.    Asthma    Carotid artery occlusion    Chronic fatigue    Chronic kidney disease    Closed left hip fracture (HCC)    Clotting disorder ([place])    pt denies this   Contusion of left knee    due to fall 1/14.   COPD (chronic obstructive pulmonary disease) (HCC)    pulmonary fibrosis   Depression, reactive    Diabetes mellitus    type II    Diastolic dysfunction    Difficulty in walking    Family history of heart disease    Generalized muscle weakness    Gout    High cholesterol    History of falling    Hypertension    Hypothyroidism    Interstitial lung disease ([place])    Meningioma of left sphenoid wing involving cavernous sinus ([place]) 02/17/2012   Continue diplopia, left eye pain and left headaches.     Morbid obesity ([place])    Morbid obesity ([place])    Neuromuscular disorder ([place])    diabetic neuropathy    Normal coronary arteries    cardiac catheterization performed   10/31/14   RA (rheumatoid arthritis) ([place])    has been off methotreaxte since 10/13.   Rheumatoid arthritis ([place])    Shortness of breath    Spinal stenosis of lumbar region    Thyroid disease    Unspecified lack of coordination    URI (upper respiratory infection)     Patient Active Problem List   Diagnosis Date Noted   Severe sepsis with lactic acidosis ([place]) 01/16/2021   Right lower lobe pneumonia 11/22/2020   Pre-operative respiratory examination 11/22/2020   Diabetic neuropathy ([place]) 05/24/2019   Sepsis secondary to UTI ([place]) 04/25/2019   Cellulitis in diabetic foot ([place]) 04/25/2019   Hypokalemia 04/21/2019   Anticoagulated 06/25/2018   Chronic pain 06/25/2018   CRI (chronic renal insufficiency), stage 3 (moderate) (HCC) 06/25/2018   Drug-induced constipation 06/18/2018   DVT (deep venous thrombosis) ([place]) 06/03/2018   Syncope and collapse 06/02/2018   Chest pain 06/02/2018   Primary osteoarthritis of both knees 05/26/2018   Body mass index (BMI) of 50-59.9 in adult ([place]) 05/26/2018   Acquired hypothyroidism 05/26/2018   Chronic bilateral low back pain without  sciatica 02/17/2018   Astigmatism with presbyopia, bilateral 03/26/2017   Cortical age-related cataract of both eyes 03/26/2017   Family history of glaucoma 03/26/2017   Long term current use of oral hypoglycemic drug 03/26/2017   Nuclear sclerotic cataract of both eyes 03/26/2017   Pain in left hip 02/18/2017   Type 2 diabetes mellitus ([place]) 01/16/2017   Closed nondisplaced intertrochanteric fracture of left femur with delayed healing 01/14/2017   Postoperative wound infection 01/14/2017   Gout 11/29/2016   Depression 11/29/2016   Closed left hip fracture ([name]) 11/29/2016   GERD (gastroesophageal reflux disease) 11/29/2016   Closed intertrochanteric fracture of hip, left, initial encounter ([place]) 11/29/2016   Physical deconditioning 07/31/2015   Pulmonary fibrosis, postinflammatory ([place]) 07/31/2015    Bronchiectasis without complication ([place]) 40/76/8088   Tendinitis of left rotator cuff 06/13/2015   Acute on chronic diastolic heart failure ([place]) 04/21/2015   Acute kidney injury superimposed on chronic kidney disease ([place]) 04/17/2015   Hypotension 04/17/2015   Chronic respiratory failure with hypoxia ([place]) 02/09/2015   Dyspnea and respiratory abnormality 01/03/2015   Normal coronary arteries 11/02/2014   Morbid obesity ([place]) 10/30/2014   Dysphagia 08/21/2014   Chronic fatigue 06/21/2014   DOE (dyspnea on exertion) 05/24/2014   Primary osteoarthritis of left knee 04/26/2014   High risk medication use 04/17/2014   Aphasia 02/12/2014   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 ([place]) 01/18/2014   Lumbar facet arthropathy 01/18/2014   Bilateral edema of lower extremity 05/30/2013   Chronic cough 05/24/2013   ILD (interstitial lung disease) ([place]) 04/10/2013   Greater trochanteric bursitis of left hip 08/13/2012   Spinal stenosis of lumbar region with radiculopathy 07/12/2012   Inability to walk 07/12/2012   Asymptomatic bacteriuria 07/12/2012   Meningioma of left sphenoid wing involving cavernous sinus ([place]) 02/17/2012   Chest pain of uncertain etiology 03/21/1593   Rheumatoid arthritis ([place]) 01/28/2012   Primary osteoarthritis of right knee 01/28/2012   Dyslipidemia    Thyroid disease    Essential hypertension     Past Surgical History:  Procedure Laterality Date   ABDOMINAL HYSTERECTOMY     BRAIN SURGERY     Gamma knife 10/13. Needs repeat spring  '14   CARDIAC CATHETERIZATION N/A 10/31/2014   Procedure: Right/Left Heart Cath and Coronary Angiography;  Surgeon: [name], MD;  Location: [facility];  Service: Cardiovascular;  Laterality: N/A;   ESOPHAGOGASTRODUODENOSCOPY (EGD) WITH PROPOFOL N/A 09/14/2014   Procedure: ESOPHAGOGASTRODUODENOSCOPY (EGD) WITH PROPOFOL;  Surgeon: [name], MD;  Location: [facility];  Service: Endoscopy;  Laterality:  N/A;   INCISION AND DRAINAGE HIP Left 01/16/2017   Procedure: IRRIGATION AND DEBRIDEMENT LEFT HIP;  Surgeon: [name], MD;  Location: [facility];  Service: Orthopedics;  Laterality: Left;   INTRAMEDULLARY (IM) NAIL INTERTROCHANTERIC Left 11/29/2016   Procedure: INTRAMEDULLARY (IM) NAIL INTERTROCHANTRIC;  Surgeon: [name], MD;  Location: [facility];  Service: Orthopedics;  Laterality: Left;   OVARY SURGERY     SHOULDER SURGERY Left    TONSILLECTOMY  age 58   VIDEO BRONCHOSCOPY Bilateral 05/31/2013   Procedure: VIDEO BRONCHOSCOPY WITHOUT FLUORO;  Surgeon: Brand Males, MD;  Location: [facility];  Service: Cardiopulmonary;  Laterality: Bilateral;   video bronscoscopy  2000   lung     OB History   No obstetric history on file.     Family History  Problem Relation Age of Onset   Diabetes Mother    Heart attack Mother  Hypertension Father    Lung cancer Father    Diabetes Sister    Diabetes Brother    Hypertension Brother    Heart disease Brother    Heart attack Brother    Kidney cancer Brother    Uterine cancer Daughter    Breast cancer Sister    Rheum arthritis Maternal Uncle    Gout Brother    Kidney failure Brother        x 5    Social History   Tobacco Use   Smoking status: Never   Smokeless tobacco: Never  Vaping Use   Vaping Use: Never used  Substance Use Topics   Alcohol use: No   Drug use: No    Home Medications Prior to Admission medications   Medication Sig Start Date End Date Taking? Authorizing Provider  acetaminophen (TYLENOL) 500 MG tablet Take 1,000 mg by mouth every 6 (six) hours as needed for moderate pain.     [provider]  albuterol (PROVENTIL HFA;VENTOLIN HFA) 108 (90 BASE) MCG/ACT inhaler Inhale 2 puffs into the lungs every 6 (six) hours as needed for wheezing or shortness of breath.    [provider]  allopurinol (ZYLOPRIM) 300 MG tablet Take 300 mg by mouth daily.    [provider]   aspirin buffered (BUFFERIN) 325 MG TABS tablet Take 325 mg by mouth daily.    [provider]  atorvastatin (LIPITOR) 20 MG tablet Take 0.5 tablets (10 mg total) by mouth every evening. 09/26/20   [name], MD  azithromycin (ZITHROMAX) 250 MG tablet Take 2 tabs on day one.  Then take 1 tab daily on days 2-7. Patient taking differently: Take 2 tabs on day one.  Then take 1 tab daily on days 2-7. 06/25/20   [name], MD  Calcium Carb-Cholecalciferol (CALCIUM + D3 PO) Take 2 tablets by mouth daily with lunch.     [provider]  cholecalciferol (VITAMIN D) 1000 UNITS tablet Take 2,000 Units by mouth daily with lunch.     [provider]  colchicine 0.6 MG tablet Take 0.6 mg by mouth daily. 06/25/16   [provider]  cyclobenzaprine (FLEXERIL) 5 MG tablet Take 1 tablet (5 mg total) by mouth at bedtime as needed for muscle spasms. 12/14/19   [name], NP  diclofenac sodium (VOLTAREN) 1 % GEL Apply 2 g topically 3 (three) times daily. Both hands and knees 02/08/19   [name], NP  DULoxetine (CYMBALTA) 60 MG capsule Take 1 capsule (60 mg total) by mouth daily. 06/29/20   [name], NP  fluticasone (FLONASE) 50 MCG/ACT nasal spray Place 2 sprays into both nostrils daily. 06/14/20   [name], MD  furosemide (LASIX) 80 MG tablet Take 1 tablet (80 mg total) by mouth daily. 11/22/20   Lorretta Harp, MD  gabapentin (NEURONTIN) 600 MG tablet Take two in the morning , Two mid- afternoon and two at bedtime 05/29/20   [name], NP  guaiFENesin (MUCINEX) 600 MG 12 hr tablet Take 1,200 mg by mouth 2 (two) times daily.    [provider]  hydroxychloroquine (PLAQUENIL) 200 MG tablet Take 200 mg by mouth daily.    [provider]  ipratropium-albuterol (DUONEB) 0.5-2.5 (3) MG/3ML SOLN Take 3 mLs by nebulization every 6 (six) hours as needed (cough/wheezing).     [provider]  leflunomide (ARAVA) 10 MG  tablet Take 20 mg by mouth daily.  Brand Males, MD  leflunomide (ARAVA) 20 MG tablet Take by mouth. 08/03/20 08/03/21  [provider]  levothyroxine (SYNTHROID) 50 MCG tablet Take 1 tablet (50 mcg total) by mouth daily. 09/26/20   [name], MD  linaclotide [facility]) 72 MCG capsule Take 1 capsule (72 mcg total) by mouth daily before breakfast. 10/09/20   [name], MD  metolazone (ZAROXOLYN) 2.5 MG tablet TAKE 1 TABLET BY MOUTH DAILY AS NEEDED FOR 2 DAYS FOR WEIGHT ABOVE 255 POUNDS 03/30/20   Lorretta Harp, MD  metoprolol succinate (TOPROL XL) 25 MG 24 hr tablet Take 3 tablets (75 mg total) by mouth daily. 11/22/20   Lorretta Harp, MD  morphine (MS CONTIN) 15 MG 12 hr tablet Take 1 tablet (15 mg total) by mouth every 12 (twelve) hours. 11/26/20   [name], NP  morphine (MSIR) 15 MG tablet One tablet twice a day as needed for break through pain. 11/26/20   [name], NP  Multiple Vitamin (MULTIVITAMIN WITH MINERALS) TABS Take 1 tablet by mouth daily.     [provider]  olmesartan (BENICAR) 40 MG tablet Take 1 tablet (40 mg total) by mouth daily. 09/26/20   [name], MD  OXYGEN-HELIUM IN Inhale 2 L into the lungs at bedtime. And on exertion    [provider]  pantoprazole (PROTONIX) 40 MG tablet take 1 tablet by mouth once daily MINUTES BEFORE 1ST MEAL OF THE DAY 09/26/20   [name], MD  potassium chloride SA (KLOR-CON) 20 MEQ tablet TAKE 1 TABLET(20 MEQ) BY MOUTH DAILY 04/30/20   [name], MD  predniSONE (DELTASONE) 10 MG tablet Take 4 tabs po daily x 2 days; then 3 tabs for 2 days; then 2 tabs for 2 days; then 1 tab for 2 days 11/22/20   Martyn Ehrich, NP  predniSONE (DELTASONE) 5 MG tablet Take 10 mg by mouth every morning.  06/20/15   [provider]  Semaglutide,0.25 or 0.5MG/DOS, (OZEMPIC, 0.25 OR 0.5 MG/DOSE,) 2 MG/1.5ML SOPN Inject 0.5 mg into the skin once a week. 09/26/20   [name], MD   sodium chloride (OCEAN) 0.65 % SOLN nasal spray Place 1 spray into both nostrils as needed for congestion.    [provider]  Spacer/Aero-Holding Chambers (AEROCHAMBER MV) inhaler Use as instructed 06/10/13   Parrett, Fonnie Mu, NP    Allergies    Codeine, Infliximab, and Lisinopril  Review of Systems   Review of Systems  Unable to perform ROS: Mental status change  All other systems reviewed and are negative.  Physical Exam Updated Vital Signs BP (!) 95/53   Pulse (!) 119   Temp (!) 101.2 F (38.4 C) (Rectal)   Resp 14   SpO2 94%   Physical Exam Vitals and nursing note reviewed.  Constitutional:      General: She is in acute distress.     Appearance: Normal appearance. She is obese. She is ill-appearing.  HENT:     Head: Normocephalic and atraumatic.     Right Ear: External ear normal.     Left Ear: External ear normal.     Nose: Nose normal.     Mouth/Throat:     Mouth: Mucous membranes are dry.  Eyes:     Conjunctiva/sclera: Conjunctivae normal.     Pupils: Pupils are equal, round, and reactive to light.  Cardiovascular:     Rate and Rhythm: Regular rhythm. Tachycardia present.  Pulses: Normal pulses.     Heart sounds: Normal heart sounds.  Pulmonary:     Effort: Pulmonary effort is normal.  Abdominal:     General: Abdomen is flat. Bowel sounds are normal.     Palpations: Abdomen is soft.  Musculoskeletal:        General: Normal range of motion.     Cervical back: Normal range of motion and neck supple.  Skin:    General: Skin is warm.     Capillary Refill: Capillary refill takes less than 2 seconds.  Neurological:     Mental Status: She is lethargic.  Psychiatric:     Comments: Unable to assess    ED Results / Procedures / Treatments   Labs (all labs ordered are listed, but only abnormal results are displayed) Labs Reviewed  CBC WITH DIFFERENTIAL/PLATELET - Abnormal; Notable for the following components:      Result Value   WBC 12.6 (*)     MCV 101.1 (*)    MCHC 29.5 (*)    Neutro Abs 8.2 (*)    Monocytes Absolute 1.2 (*)    Abs Immature Granulocytes 0.10 (*)    All other components within normal limits  COMPREHENSIVE METABOLIC PANEL - Abnormal; Notable for the following components:   Glucose, Bld 177 (*)    Creatinine, Ser 1.61 (*)    Calcium 8.2 (*)    Total Protein 5.7 (*)    Albumin 3.0 (*)    GFR, Estimated 32 (*)    All other components within normal limits  URINALYSIS, ROUTINE W REFLEX MICROSCOPIC - Abnormal; Notable for the following components:   Color, Urine AMBER (*)    APPearance HAZY (*)    Bilirubin Urine SMALL (*)    Leukocytes,Ua TRACE (*)    Bacteria, UA RARE (*)    All other components within normal limits  LACTIC ACID, PLASMA - Abnormal; Notable for the following components:   Lactic Acid, Venous 3.4 (*)    All other components within normal limits  TSH - Abnormal; Notable for the following components:   TSH 4.598 (*)    All other components within normal limits  I-STAT CHEM 8, ED - Abnormal; Notable for the following components:   BUN 27 (*)    Creatinine, Ser 1.60 (*)    Glucose, Bld 167 (*)    Calcium, Ion 0.96 (*)    All other components within normal limits  I-STAT ARTERIAL BLOOD GAS, ED - Abnormal; Notable for the following components:   pCO2 arterial 49.9 (*)    Bicarbonate 30.5 (*)    Acid-Base Excess 5.0 (*)    Calcium, Ion 0.99 (*)    All other components within normal limits  TROPONIN I (HIGH SENSITIVITY) - Abnormal; Notable for the following components:   Troponin I (High Sensitivity) 18 (*)    All other components within normal limits  RESP PANEL BY RT-PCR (FLU A&B, COVID) ARPGX2  CULTURE, BLOOD (ROUTINE X 2)  CULTURE, BLOOD (ROUTINE X 2)  GASTROINTESTINAL PANEL BY PCR, STOOL (REPLACES STOOL CULTURE)  C DIFFICILE QUICK SCREEN W PCR REFLEX    PROTIME-INR  AMMONIA  BLOOD GAS, ARTERIAL  CBG MONITORING, ED  TROPONIN I (HIGH SENSITIVITY)    EKG EKG  Interpretation  Date/Time:  Wednesday January 16 2021 15:33:49 EDT Ventricular Rate:  127 PR Interval:  132 QRS Duration: 84 QT Interval:  301 QTC Calculation: 438 R Axis:   -52 Text Interpretation: Sinus tachycardia Paired ventricular premature complexes Left anterior  fascicular block Low voltage, precordial leads Consider anterior infarct No significant change since last tracing Confirmed by Isla Pence [phone]) on 01/16/2021 3:37:58 PM  Radiology CT Head Wo Contrast  Result Date: 01/16/2021 CLINICAL DATA:  Mental status change. Unknown cause. has been unresponsive and out of oxygen for an hour. Pt responsive to pain EXAM: CT HEAD WITHOUT CONTRAST TECHNIQUE: Contiguous axial images were obtained from the base of the skull through the vertex without intravenous contrast. COMPARISON:  MRI head 02/28/2014 BRAIN: BRAIN Patchy and confluent areas of decreased attenuation are noted throughout the deep and periventricular white matter of the cerebral hemispheres bilaterally, compatible with chronic microvascular ischemic disease. No evidence of large-territorial acute infarction. No parenchymal hemorrhage. No mass lesion. No extra-axial collection. No mass effect or midline shift. No hydrocephalus. Basilar cisterns are patent. Vascular: No hyperdense vessel. Skull: No acute fracture or focal lesion. Sinuses/Orbits: Paranasal sinuses and mastoid air cells are clear. The orbits are unremarkable. Other: None. IMPRESSION: No acute intracranial abnormality. Electronically Signed   By: Iven Finn M.D.   On: 01/16/2021 18:18   DG Chest Portable 1 View  Result Date: 01/16/2021 CLINICAL DATA:  Unresponsive EXAM: PORTABLE CHEST 1 VIEW COMPARISON:  Chest radiograph 11/22/2020 FINDINGS: The heart is mildly enlarged. The mediastinum is prominent, increased from prior. Lung volumes are low. There are patchy opacities in the lung bases, right more than left. There is no significant pleural effusion. There is no  pneumothorax. There is no acute osseous abnormality. IMPRESSION: 1. Widening of the mediastinum, likely exaggerated by low lung volumes and AP technique, though aortic aneurysm or dissection are possibility. Recommend CTA chest for further evaluation. 2. Patchy opacities in the lung bases may reflect atelectasis or infection. These results were called by telephone at the time of interpretation on 01/16/2021 at 4:47 pm to provider [facility] , who verbally acknowledged these results. Electronically Signed   By: Valetta Mole M.D.   On: 01/16/2021 16:48   CT Angio Chest/Abd/Pel for Dissection W and/or Wo Contrast  Result Date: 01/16/2021 CLINICAL DATA:  Thoracic aortic aneurysm (TAA) suspected widened mediastinum on CXR. Unresponsive. EXAM: CT ANGIOGRAPHY CHEST, ABDOMEN AND PELVIS TECHNIQUE: Non-contrast CT of the chest was initially obtained. Multidetector CT imaging through the chest, abdomen and pelvis was performed using the standard protocol during bolus administration of intravenous contrast. Multiplanar reconstructed images and MIPs were obtained and reviewed to evaluate the vascular anatomy. CONTRAST:  73m OMNIPAQUE IOHEXOL 350 MG/ML SOLN COMPARISON:  Chest x-ray 01/16/2021, chest x-ray 11/22/2020, CT chest 04/21/2019 FINDINGS: CTA CHEST FINDINGS Cardiovascular: Preferential opacification of the thoracic aorta. No evidence of thoracic aortic aneurysm or dissection. Mild atherosclerotic plaque of the thoracic aorta. At least 2 vessel coronary artery calcifications. Normal heart size. No significant pericardial effusion. The main pulmonary artery is normal in caliber. No central pulmonary embolus. Mediastinum/Nodes: Prominent but nonenlarged mediastinal lymph nodes. No enlarged mediastinal, hilar, or axillary lymph nodes. Thyroid gland, trachea, and esophagus demonstrate no significant findings. Lungs/Pleura: Expiratory phase of respiration with mosaic attenuation of the lungs. Diffuse peribronchovascular  ground-glass airspace opacity. Similar-appearing underlying trace peripheral reticulations. No focal consolidation. No pulmonary nodule. No pulmonary mass. No pleural effusion. No pneumothorax. Musculoskeletal: No chest wall abnormality. No suspicious lytic or blastic osseous lesions. No acute displaced fracture. Multilevel degenerative changes of the spine. Review of the MIP images confirms the above findings. CTA ABDOMEN AND PELVIS FINDINGS VASCULAR Aorta: Mild atherosclerotic plaque. Normal caliber aorta without aneurysm, dissection, vasculitis or significant stenosis. Celiac: Patent without  evidence of aneurysm, dissection, vasculitis or significant stenosis. SMA: Mild atherosclerotic plaque. Patent without evidence of aneurysm, dissection, vasculitis or significant stenosis. Renals: Mild atherosclerotic plaque. Both renal arteries are patent without evidence of aneurysm, dissection, vasculitis, fibromuscular dysplasia or significant stenosis. IMA: Patent without evidence of aneurysm, dissection, vasculitis or significant stenosis. Inflow: Mild atherosclerotic plaque. Patent without evidence of aneurysm, dissection, vasculitis or significant stenosis. Veins: No obvious venous abnormality within the limitations of this arterial phase study. Review of the MIP images confirms the above findings. NON-VASCULAR Hepatobiliary: No focal liver abnormality. No gallstones, gallbladder wall thickening, or pericholecystic fluid. No biliary dilatation. Pancreas: No focal lesion. Normal pancreatic contour. No surrounding inflammatory changes. No main pancreatic ductal dilatation. Spleen: Normal in size without focal abnormality. Adrenals/Urinary Tract: No adrenal nodule bilaterally. Nonspecific trace bilateral perinephric stranding. Bilateral kidneys enhance symmetrically. Parapelvic cyst versus prominent renal pelvis on the right. Fluid density lesions within the kidneys likely represent simple renal cysts measuring up to 6  cm on the right. Couple of punctate calcifications noted within the right kidney. No hydronephrosis. No hydroureter. The urinary bladder is unremarkable. Stomach/Bowel: Stomach is within normal limits. No evidence of small bowel wall thickening or dilatation. No large bowel dilatation. Circumferential bowel wall thickening of the descending colon. Trace pericolonic fat stranding. Appendix appears normal. Lymphatic: No lymphadenopathy. Reproductive: Status post hysterectomy. No adnexal masses. Other: No intraperitoneal free fluid. No intraperitoneal free gas. No organized fluid collection. Musculoskeletal: No abdominal wall hernia or abnormality. No suspicious lytic or blastic osseous lesions. No acute displaced fracture. Multilevel severe degenerative changes of the spine. Partially visualized proximal left femoral fracture status post surgical hardware fixation. Review of the MIP images confirms the above findings. IMPRESSION: 1. No acute vascular abnormality. Findings noted on chest x-ray likely due to patient rotation and positioning. 2. Diffuse peribronchovascular ground-glass airspace opacities may represent pulmonary edema versus infection/inflammation (such as aspiration pneumonia). 3. Descending colon colitis. Differential diagnosis for etiology includes infection, inflammation, ischemia. No findings of associated bowel perforation or obstruction. Recommend colonoscopy status post treatment and status post complete resolution of inflammatory changes to exclude an underlying lesion. 4. Other imaging findings of potential clinical significance: Nonobstructive punctate right nephrolithiasis. Simple renal cysts within the kidneys. Electronically Signed   By: Iven Finn M.D.   On: 01/16/2021 18:16    Procedures Procedures   Medications Ordered in ED Medications  lactated ringers infusion ( Intravenous New Bag/Given 01/16/21 1755)  cefTRIAXone (ROCEPHIN) 2 g in sodium chloride 0.9 % 100 mL IVPB (0 g  Intravenous Stopped 01/16/21 1740)  azithromycin (ZITHROMAX) 500 mg in sodium chloride 0.9 % 250 mL IVPB (500 mg Intravenous New Bag/Given 01/16/21 1756)  sodium chloride 0.9 % bolus 1,000 mL (0 mLs Intravenous Stopped 01/16/21 1713)  sodium chloride 0.9 % bolus 1,000 mL (1,000 mLs Intravenous New Bag/Given 01/16/21 1608)  acetaminophen (TYLENOL) suppository 650 mg (650 mg Rectal Given 01/16/21 1708)  sodium chloride 0.9 % bolus 1,000 mL (1,000 mLs Intravenous New Bag/Given 01/16/21 1713)  iohexol (OMNIPAQUE) 350 MG/ML injection 70 mL (70 mLs Intravenous Contrast Given 01/16/21 1748)    ED Course  I have reviewed the triage vital signs and the nursing notes.  Pertinent labs & imaging results that were available during my care of the patient were reviewed by me and considered in my medical decision making (see chart for details).    MDM Rules/Calculators/A&P  Pt is more alert after IVFs.  Initially, I thought she had hypercarbia because she was afebrile.  However, her ph was nl and pco2 not too abnormal.  I then had the nurse check a rectal temp.  She was febrile.  At that time, a code sepsis was called.  Due to the possibility of pna, pt was given rocephin/zithromax.  Pt's vitals and ms are improving with IVFs.  Pt does have multifocal pna and possible colitis.  Pt was on Augmentin a week ago and her daughter said she's had some diarrhea.  Pt d/w Dr. Posey Pronto ([facility]) for admission.  CRITICAL CARE Performed by: Isla Pence   Total critical care time: 45 minutes  Critical care time was exclusive of separately billable procedures and treating other patients.  Critical care was necessary to treat or prevent imminent or life-threatening deterioration.  Critical care was time spent personally by me on the following activities: development of treatment plan with patient and/or surrogate as well as nursing, discussions with consultants, evaluation of patient's response to  treatment, examination of patient, obtaining history from patient or surrogate, ordering and performing treatments and interventions, ordering and review of laboratory studies, ordering and review of radiographic studies, pulse oximetry and re-evaluation of patient's condition.  Final Clinical Impression(s) / ED Diagnoses Final diagnoses:  Sepsis without acute organ dysfunction, due to unspecified organism [facility])  Metabolic encephalopathy  Dehydration  Multifocal pneumonia  Colitis    Rx / DC Orders ED Discharge Orders     None        Isla Pence, MD 01/16/21 [phone]

## 2021-01-16 NOTE — Sepsis Progress Note (Signed)
Notified bedside nurse of need to administer antibiotics and collect a repeat Lactic Acid after completing the fluid bolus.

## 2021-01-16 NOTE — ED Notes (Signed)
Pt transported to CT via stretcher at this time.

## 2021-01-17 ENCOUNTER — Other Ambulatory Visit: Payer: Self-pay

## 2021-01-17 ENCOUNTER — Encounter (HOSPITAL_COMMUNITY): Payer: Self-pay | Admitting: Internal Medicine

## 2021-01-17 DIAGNOSIS — I5032 Chronic diastolic (congestive) heart failure: Secondary | ICD-10-CM

## 2021-01-17 DIAGNOSIS — G9341 Metabolic encephalopathy: Secondary | ICD-10-CM

## 2021-01-17 DIAGNOSIS — N179 Acute kidney failure, unspecified: Secondary | ICD-10-CM

## 2021-01-17 LAB — CBC
HCT: 43.2 % (ref 36.0–46.0)
Hemoglobin: 13.1 g/dL (ref 12.0–15.0)
MCH: 30.5 pg (ref 26.0–34.0)
MCHC: 30.3 g/dL (ref 30.0–36.0)
MCV: 100.5 fL — ABNORMAL HIGH (ref 80.0–100.0)
Platelets: 124 10*3/uL — ABNORMAL LOW (ref 150–400)
RBC: 4.3 MIL/uL (ref 3.87–5.11)
RDW: 14.3 % (ref 11.5–15.5)
WBC: 14.6 10*3/uL — ABNORMAL HIGH (ref 4.0–10.5)
nRBC: 0 % (ref 0.0–0.2)

## 2021-01-17 LAB — GLUCOSE, CAPILLARY
Glucose-Capillary: 156 mg/dL — ABNORMAL HIGH (ref 70–99)
Glucose-Capillary: 102 mg/dL — ABNORMAL HIGH (ref 70–99)
Glucose-Capillary: 113 mg/dL — ABNORMAL HIGH (ref 70–99)
Glucose-Capillary: 188 mg/dL — ABNORMAL HIGH (ref 70–99)
Glucose-Capillary: 165 mg/dL — ABNORMAL HIGH (ref 70–99)
Glucose-Capillary: 174 mg/dL — ABNORMAL HIGH (ref 70–99)

## 2021-01-17 LAB — BASIC METABOLIC PANEL
Anion gap: 12 (ref 5–15)
BUN: 17 mg/dL (ref 8–23)
CO2: 21 mmol/L — ABNORMAL LOW (ref 22–32)
Calcium: 7.4 mg/dL — ABNORMAL LOW (ref 8.9–10.3)
Chloride: 106 mmol/L (ref 98–111)
Creatinine, Ser: 1.05 mg/dL — ABNORMAL HIGH (ref 0.44–1.00)
GFR, Estimated: 54 mL/min — ABNORMAL LOW (ref >60)
Glucose, Bld: 124 mg/dL — ABNORMAL HIGH (ref 70–99)
Potassium: 3.6 mmol/L (ref 3.5–5.1)
Sodium: 139 mmol/L (ref 135–145)

## 2021-01-17 LAB — MAGNESIUM: Magnesium: 2 mg/dL (ref 1.7–2.4)

## 2021-01-17 LAB — PROCALCITONIN: Procalcitonin: 0.36 ng/mL

## 2021-01-17 MED ORDER — PREDNISONE 20 MG PO TABS
20.0000 mg | ORAL_TABLET | Freq: Every morning | ORAL | Status: DC
  Administered 2021-01-17: 20 mg via ORAL
  Filled 2021-01-17: qty 1

## 2021-01-17 MED ORDER — MORPHINE SULFATE ER 15 MG PO TBCR
15.0000 mg | EXTENDED_RELEASE_TABLET | Freq: Two times a day (BID) | ORAL | Status: DC
  Administered 2021-01-17: 15 mg via ORAL
  Filled 2021-01-17: qty 1

## 2021-01-17 MED ORDER — GABAPENTIN 600 MG PO TABS
600.0000 mg | ORAL_TABLET | Freq: Three times a day (TID) | ORAL | Status: DC
Start: 2021-01-17 — End: 2021-01-20
  Administered 2021-01-17 – 2021-01-20 (×9): 600 mg via ORAL
  Filled 2021-01-17 (×9): qty 1

## 2021-01-17 MED ORDER — SODIUM CHLORIDE 0.9 % IV SOLN: INTRAVENOUS | Status: AC

## 2021-01-17 MED ORDER — HYDROCORTISONE NA SUCCINATE PF 100 MG IJ SOLR
50.0000 mg | Freq: Three times a day (TID) | INTRAMUSCULAR | Status: DC
  Administered 2021-01-17 – 2021-01-18 (×4): 50 mg via INTRAVENOUS
  Filled 2021-01-17 (×4): qty 2

## 2021-01-17 MED ORDER — DICLOFENAC SODIUM 1 % EX GEL
2.0000 g | Freq: Three times a day (TID) | CUTANEOUS | Status: DC | PRN
  Administered 2021-01-17 – 2021-01-19 (×2): 2 g via TOPICAL
  Filled 2021-01-17: qty 100

## 2021-01-17 MED ORDER — MORPHINE SULFATE ER 15 MG PO TBCR
15.0000 mg | EXTENDED_RELEASE_TABLET | Freq: Two times a day (BID) | ORAL | Status: DC | PRN
  Administered 2021-01-17 – 2021-01-18 (×2): 15 mg via ORAL
  Filled 2021-01-17 (×2): qty 1

## 2021-01-17 MED ORDER — METOPROLOL TARTRATE 25 MG PO TABS
25.0000 mg | ORAL_TABLET | Freq: Two times a day (BID) | ORAL | Status: DC
  Administered 2021-01-17 – 2021-01-19 (×5): 25 mg via ORAL
  Filled 2021-01-17 (×6): qty 1

## 2021-01-17 MED ORDER — PREDNISONE 10 MG PO TABS: 10.0000 mg | ORAL_TABLET | Freq: Every morning | ORAL | Status: DC

## 2021-01-17 MED ORDER — SODIUM CHLORIDE 0.9 % IV SOLN
2.0000 g | INTRAVENOUS | Status: DC
  Administered 2021-01-17 – 2021-01-20 (×4): 2 g via INTRAVENOUS
  Filled 2021-01-17 (×4): qty 20

## 2021-01-17 MED ORDER — PANTOPRAZOLE SODIUM 40 MG PO TBEC
40.0000 mg | DELAYED_RELEASE_TABLET | Freq: Every day | ORAL | Status: DC
  Administered 2021-01-17 – 2021-01-20 (×4): 40 mg via ORAL
  Filled 2021-01-17 (×4): qty 1

## 2021-01-17 MED ORDER — METRONIDAZOLE 500 MG/100ML IV SOLN
500.0000 mg | Freq: Two times a day (BID) | INTRAVENOUS | Status: DC
  Administered 2021-01-17 – 2021-01-20 (×7): 500 mg via INTRAVENOUS
  Filled 2021-01-17 (×7): qty 100

## 2021-01-17 MED ORDER — DULOXETINE HCL 60 MG PO CPEP
60.0000 mg | ORAL_CAPSULE | Freq: Every day | ORAL | Status: DC
Start: 2021-01-17 — End: 2021-01-20
  Administered 2021-01-17 – 2021-01-20 (×4): 60 mg via ORAL
  Filled 2021-01-17 (×4): qty 1

## 2021-01-17 NOTE — Progress Notes (Addendum)
0300- Patient arrived into room 5W28 from the ED via stretcher. A&Ox4. VSS. NO signs of acute distress. Bed locked and in lowest position, call light and personal belongings within reach   0320- Patient wanted writer to call and update her daughter [name] [phone]. Attempted to call no answer, voicemail was left with call back number. Called and updated daughter Lauro Regulus as well  0530- Patient is stating her left side and back are hurting, PTA MS contin not ordered. Text paged Dr. [name] regarding PTA pain medication

## 2021-01-17 NOTE — Progress Notes (Signed)
Neck Zithromax,                        PROGRESS NOTE        PATIENT DETAILS Name: [name] Age: 80 y.o. Sex: female Date of Birth: 10/25/1940 Admit Date: 01/16/2021 Admitting Physician Lenore Cordia, MD GUR:KYHCW, Langley Adie, MD  Brief Narrative: Patient is a 80 y.o. female with history of pulmonary fibrosis/bronchiectasis-chronic hypoxic respiratory failure, HFpEF, RA on prednisone, DM-2, HTN, HLD, hypothyroidism, chronic lower extremity lymphedema, chronic pain syndrome-presented to the hospital with diarrhea, altered mental status-she was found to have sepsis physiology due to left-sided colitis.  She recently had completed a course of Augmentin for a cyst/abscess on her right breast.  She was subsequently admitted to the hospitalist service.  See below for further details.  Significant events: 8/31>> confusion/diarrhea/fever/hypotension-sepsis/encephalopathy-due to left-sided colitis.  Significant studies: 8/31>> CT head: No acute intracranial abnormality. 8/31>> CTA chest/abdomen: No acute vascular abnormality-descending colon colitis.  Antimicrobial therapy: Rocephin: 8/31>> Flagyl: 9/1>>  Microbiology data: 8/31>> influenza/COVID PCR: Negative 8/31>> blood cultures: Negative  Procedures : None  Consults: None  DVT Prophylaxis : heparin injection 5,000 Units Start: 01/16/21 2200   Subjective: Acknowledges left sided abdominal pain.  Diarrhea has resolved-she has not had any BM overnight.   Assessment/Plan: Severe sepsis due to left-sided colitis: Sepsis physiology has improved-although BP is still soft this morning.  Since BP is soft-and patient is on chronic prednisone-we will start on stress dosing of hydrocortisone.  She continues to have left-sided abdominal pain-although there was some concern for C. difficile colitis-given rapid improvement of diarrhea with IV Rocephin in the emergency room-doubt this is C. difficile colitis.  Restart Rocephin-add  Flagyl for anaerobic coverage.  Follow closely.  If diarrhea reoccurs-we will need to send out stool studies.  Acute metabolic encephalopathy: Resolved-likely due to sepsis/dehydration.  CT head was negative.  AKI: Hemodynamically mediated in the setting of diarrhea-improved-creatinine back to baseline.  Pulmonary fibrosis/chronic bronchiectasis-with chronic hypoxic respiratory failure-on 2 L of oxygen at home: This appears stable-continue bronchodilators-Zithromax which she is on chronically currently on hold.  HFpEF: Euvolemic on exam-watch closely while on IVF.  Resume diuretics when renal function/BP is better.  Rheumatoid arthritis: On stress dosing of hydrocortisone-chronically on 10 mg of prednisone at baseline.  Plaquenil/leflunomide on hold.  HTN: Since BP soft-all antihypertensives on hold.  DM-2  (A1c 5.6 on 5/11): CBG stable with SSI  Recent Labs    01/16/21 1532 01/16/21 2342 01/17/21 0806  GLUCAP 156* 117* 102*    HLD: Continue statin  Hypothyroidism: Continue Synthroid-TSH within normal limits.  Chronic back pain/chronic pain syndrome: Her chronic narcotics have been resumed-we will need to watch closely to make sure her blood pressure can tolerate.  We will go ahead and resume Neurontin and Cymbalta as well.  Debility/deconditioning/functional quadriplegia: Due to acute illness-await PT/OT eval.  Morbid Obesity: Estimated body mass index is 42.72 kg/m as calculated from the following:   Height as of this encounter: _0  (1.626 m).   Weight as of this encounter: 112.9 kg.    Diet: Diet Order             Diet full liquid Room service appropriate? Yes; Fluid consistency: Thin  Diet effective now                    Code Status: Full code   Family Communication: Spoke with Son-Curtis [phone] over phone  Disposition Plan: Status  is: Inpatient  Remains inpatient appropriate because:Inpatient level of care appropriate due to severity of  illness  Dispo: The patient is from: Home              Anticipated d/c is to: Home              Patient currently is not medically stable to d/c.   Difficult to place patient No  Barriers to Discharge: Sepsis physiology due to colitis-awaiting culture data-sepsis physiology improved but not resolved-not yet stable for discharge.  Antimicrobial agents: Anti-infectives (From admission, onward)    Start     Dose/Rate Route Frequency Ordered Stop   01/17/21 1000  metroNIDAZOLE (FLAGYL) IVPB 500 mg        500 mg 100 mL/hr over 60 Minutes Intravenous Every 12 hours 01/17/21 0854     01/17/21 1000  cefTRIAXone (ROCEPHIN) 2 g in sodium chloride 0.9 % 100 mL IVPB        2 g 200 mL/hr over 30 Minutes Intravenous Every 24 hours 01/17/21 0854     01/16/21 1700  cefTRIAXone (ROCEPHIN) 2 g in sodium chloride 0.9 % 100 mL IVPB  Status:  Discontinued        2 g 200 mL/hr over 30 Minutes Intravenous Every 24 hours 01/16/21 1654 01/16/21 1943   01/16/21 1700  azithromycin (ZITHROMAX) 500 mg in sodium chloride 0.9 % 250 mL IVPB  Status:  Discontinued        500 mg 250 mL/hr over 60 Minutes Intravenous Every 24 hours 01/16/21 1654 01/16/21 1943        Time spent: 35 minutes-Greater than 50% of this time was spent in counseling, explanation of diagnosis, planning of further management, and coordination of care.  MEDICATIONS: Scheduled Meds:  atorvastatin  10 mg Oral QPM   guaiFENesin  1,200 mg Oral BID   heparin  5,000 Units Subcutaneous Q8H   hydrocortisone sod succinate (SOLU-CORTEF) inj  50 mg Intravenous Q8H   insulin aspart  0-9 Units Subcutaneous TID WC   levothyroxine  50 mcg Oral QAC breakfast   morphine  15 mg Oral Q12H   pantoprazole  40 mg Oral Daily   sodium chloride flush  3 mL Intravenous Q12H   Continuous Infusions:  sodium chloride 100 mL/hr at 01/17/21 0957   cefTRIAXone (ROCEPHIN)  IV 2 g (01/17/21 1015)   metronidazole 500 mg (01/17/21 1022)   PRN Meds:.acetaminophen  **OR** acetaminophen, albuterol, ipratropium-albuterol, ondansetron **OR** ondansetron (ZOFRAN) IV, sodium chloride   PHYSICAL EXAM: Vital signs: Vitals:   01/17/21 0311 01/17/21 0522 01/17/21 0807 01/17/21 0844  BP: (!) 146/75 121/87 (!) 90/47 (!) 93/39  Pulse: (!) 126  (!) 122 (!) 123  Resp: 18  14   Temp: 99.4 F (37.4 C) 99 F (37.2 C) 100 F (37.8 C)   TempSrc: Oral Oral Oral   SpO2: 94% 96% 95% 98%  Weight: 112.9 kg     Height: _0  (1.626 m)      Filed Weights   01/17/21 0311  Weight: 112.9 kg   Body mass index is 42.72 kg/m.   Gen Exam:Alert awake-not in any distress HEENT:atraumatic, normocephalic Chest: B/L clear to auscultation anteriorly CVS:S1S2 regular Abdomen:soft-mild left-sided tenderness without any rebound. Extremities: Trace Edema Neurology: Non focal Skin: no rash  I have personally reviewed following labs and imaging studies  LABORATORY DATA: CBC: Recent Labs  Lab 01/16/21 1535 01/16/21 1557 01/16/21 1612 01/17/21 0356  WBC 12.6*  --   --  14.6*  NEUTROABS 8.2*  --   --   --   HGB 13.5 15.0 12.6 13.1  HCT 45.7 44.0 37.0 43.2  MCV 101.1*  --   --  100.5*  PLT 161  --   --  124*    Basic Metabolic Panel: Recent Labs  Lab 01/16/21 1535 01/16/21 1557 01/16/21 1612 01/17/21 0356  NA 140 139 139 139  K 3.9 4.0 3.5 3.6  CL 100 99  --  106  CO2 32  --   --  21*  GLUCOSE 177* 167*  --  124*  BUN 21 27*  --  17  CREATININE 1.61* 1.60*  --  1.05*  CALCIUM 8.2*  --   --  7.4*  MG  --   --   --  2.0    GFR: Estimated Creatinine Clearance: 53.5 mL/min (A) (by C-G formula based on SCr of 1.05 mg/dL (H)).  Liver Function Tests: Recent Labs  Lab 01/16/21 1535  AST 21  ALT 31  ALKPHOS 74  BILITOT 0.9  PROT 5.7*  ALBUMIN 3.0*   No results for input(s): LIPASE, AMYLASE in the last 168 hours. Recent Labs  Lab 01/16/21 1535  AMMONIA 16    Coagulation Profile: Recent Labs  Lab 01/16/21 1535  INR 1.0    Cardiac  Enzymes: No results for input(s): CKTOTAL, CKMB, CKMBINDEX, TROPONINI in the last 168 hours.  BNP (last 3 results) No results for input(s): PROBNP in the last 8760 hours.  Lipid Profile: No results for input(s): CHOL, HDL, LDLCALC, TRIG, CHOLHDL, LDLDIRECT in the last 72 hours.  Thyroid Function Tests: Recent Labs    01/16/21 1537  TSH 4.598*    Anemia Panel: No results for input(s): VITAMINB12, FOLATE, FERRITIN, TIBC, IRON, RETICCTPCT in the last 72 hours.  Urine analysis:    Component Value Date/Time   COLORURINE AMBER (A) 01/16/2021 1535   APPEARANCEUR HAZY (A) 01/16/2021 1535   LABSPEC 1.025 01/16/2021 1535   PHURINE 5.0 01/16/2021 1535   GLUCOSEU NEGATIVE 01/16/2021 1535   HGBUR NEGATIVE 01/16/2021 1535   BILIRUBINUR SMALL (A) 01/16/2021 1535   BILIRUBINUR neg 11/15/2019 1406   KETONESUR NEGATIVE 01/16/2021 1535   PROTEINUR NEGATIVE 01/16/2021 1535   UROBILINOGEN 0.2 11/28/2019 1424   NITRITE NEGATIVE 01/16/2021 1535   LEUKOCYTESUR TRACE (A) 01/16/2021 1535    Sepsis Labs: Lactic Acid, Venous    Component Value Date/Time   LATICACIDVEN 1.3 01/16/2021 1942    MICROBIOLOGY: Recent Results (from the past 240 hour(s))  Resp Panel by RT-PCR (Flu A&B, Covid) Nasopharyngeal Swab     Status: None   Collection Time: 01/16/21  3:36 PM   Specimen: Nasopharyngeal Swab; Nasopharyngeal(NP) swabs in vial transport medium  Result Value Ref Range Status   SARS Coronavirus 2 by RT PCR NEGATIVE NEGATIVE Final    Comment: (NOTE) SARS-CoV-2 target nucleic acids are NOT DETECTED.  The SARS-CoV-2 RNA is generally detectable in upper respiratory specimens during the acute phase of infection. The lowest concentration of SARS-CoV-2 viral copies this assay can detect is 138 copies/mL. A negative result does not preclude SARS-Cov-2 infection and should not be used as the sole basis for treatment or other patient management decisions. A negative result may occur with  improper  specimen collection/handling, submission of specimen other than nasopharyngeal swab, presence of viral mutation(s) within the areas targeted by this assay, and inadequate number of viral copies(<138 copies/mL). A negative result must be combined with clinical observations, patient history, and epidemiological information. The expected  result is Negative.  Fact Sheet for Patients:  EntrepreneurPulse.com.au  Fact Sheet for Healthcare Providers:  IncredibleEmployment.be  This test is no t yet approved or cleared by the [place] FDA and  has been authorized for detection and/or diagnosis of SARS-CoV-2 by FDA under an Emergency Use Authorization (EUA). This EUA will remain  in effect (meaning this test can be used) for the duration of the COVID-19 declaration under Section 564(b)(1) of the Act, 21 U.S.C.section 360bbb-3(b)(1), unless the authorization is terminated  or revoked sooner.       Influenza A by PCR NEGATIVE NEGATIVE Final   Influenza B by PCR NEGATIVE NEGATIVE Final    Comment: (NOTE) The Xpert Xpress SARS-CoV-2/FLU/RSV plus assay is intended as an aid in the diagnosis of influenza from Nasopharyngeal swab specimens and should not be used as a sole basis for treatment. Nasal washings and aspirates are unacceptable for Xpert Xpress SARS-CoV-2/FLU/RSV testing.  Fact Sheet for Patients: EntrepreneurPulse.com.au  Fact Sheet for Healthcare Providers: IncredibleEmployment.be  This test is not yet approved or cleared by the [place] FDA and has been authorized for detection and/or diagnosis of SARS-CoV-2 by FDA under an Emergency Use Authorization (EUA). This EUA will remain in effect (meaning this test can be used) for the duration of the COVID-19 declaration under Section 564(b)(1) of the Act, 21 U.S.C. section 360bbb-3(b)(1), unless the authorization is terminated or revoked.  Performed at  [facility], [place] [address], [place], [place] [address]   Culture, blood (routine x 2)     Status: None (Preliminary result)   Collection Time: 01/16/21  3:50 PM   Specimen: BLOOD  Result Value Ref Range Status   Specimen Description BLOOD LEFT ANTECUBITAL  Final   Special Requests   Final    BOTTLES DRAWN AEROBIC AND ANAEROBIC Blood Culture adequate volume   Culture   Final    NO GROWTH < 24 HOURS Performed at [facility], [place] [address] [place], [place] [address]    Report Status PENDING  Incomplete  Culture, blood (routine x 2)     Status: None (Preliminary result)   Collection Time: 01/16/21  3:55 PM   Specimen: BLOOD  Result Value Ref Range Status   Specimen Description BLOOD RIGHT ANTECUBITAL  Final   Special Requests   Final    BOTTLES DRAWN AEROBIC AND ANAEROBIC Blood Culture adequate volume   Culture   Final    NO GROWTH < 24 HOURS Performed at [facility], [place] [address], [place], [place] [address]    Report Status PENDING  Incomplete    RADIOLOGY STUDIES/RESULTS: CT Head Wo Contrast  Result Date: 01/16/2021 CLINICAL DATA:  Mental status change. Unknown cause. has been unresponsive and out of oxygen for an hour. Pt responsive to pain EXAM: CT HEAD WITHOUT CONTRAST TECHNIQUE: Contiguous axial images were obtained from the base of the skull through the vertex without intravenous contrast. COMPARISON:  MRI head 02/28/2014 BRAIN: BRAIN Patchy and confluent areas of decreased attenuation are noted throughout the deep and periventricular white matter of the cerebral hemispheres bilaterally, compatible with chronic microvascular ischemic disease. No evidence of large-territorial acute infarction. No parenchymal hemorrhage. No mass lesion. No extra-axial collection. No mass effect or midline shift. No hydrocephalus. Basilar cisterns are patent. Vascular: No hyperdense vessel. Skull: No acute fracture or focal lesion. Sinuses/Orbits: Paranasal sinuses and  mastoid air cells are clear. The orbits are unremarkable. Other: None. IMPRESSION: No acute intracranial abnormality. Electronically Signed  By: Iven Finn M.D.   On: 01/16/2021 18:18   DG Chest Portable 1 View  Result Date: 01/16/2021 CLINICAL DATA:  Unresponsive EXAM: PORTABLE CHEST 1 VIEW COMPARISON:  Chest radiograph 11/22/2020 FINDINGS: The heart is mildly enlarged. The mediastinum is prominent, increased from prior. Lung volumes are low. There are patchy opacities in the lung bases, right more than left. There is no significant pleural effusion. There is no pneumothorax. There is no acute osseous abnormality. IMPRESSION: 1. Widening of the mediastinum, likely exaggerated by low lung volumes and AP technique, though aortic aneurysm or dissection are possibility. Recommend CTA chest for further evaluation. 2. Patchy opacities in the lung bases may reflect atelectasis or infection. These results were called by telephone at the time of interpretation on 01/16/2021 at 4:47 pm to provider [facility] , who verbally acknowledged these results. Electronically Signed   By: Valetta Mole M.D.   On: 01/16/2021 16:48   CT Angio Chest/Abd/Pel for Dissection W and/or Wo Contrast  Result Date: 01/16/2021 CLINICAL DATA:  Thoracic aortic aneurysm (TAA) suspected widened mediastinum on CXR. Unresponsive. EXAM: CT ANGIOGRAPHY CHEST, ABDOMEN AND PELVIS TECHNIQUE: Non-contrast CT of the chest was initially obtained. Multidetector CT imaging through the chest, abdomen and pelvis was performed using the standard protocol during bolus administration of intravenous contrast. Multiplanar reconstructed images and MIPs were obtained and reviewed to evaluate the vascular anatomy. CONTRAST:  96m OMNIPAQUE IOHEXOL 350 MG/ML SOLN COMPARISON:  Chest x-ray 01/16/2021, chest x-ray 11/22/2020, CT chest 04/21/2019 FINDINGS: CTA CHEST FINDINGS Cardiovascular: Preferential opacification of the thoracic aorta. No evidence of thoracic  aortic aneurysm or dissection. Mild atherosclerotic plaque of the thoracic aorta. At least 2 vessel coronary artery calcifications. Normal heart size. No significant pericardial effusion. The main pulmonary artery is normal in caliber. No central pulmonary embolus. Mediastinum/Nodes: Prominent but nonenlarged mediastinal lymph nodes. No enlarged mediastinal, hilar, or axillary lymph nodes. Thyroid gland, trachea, and esophagus demonstrate no significant findings. Lungs/Pleura: Expiratory phase of respiration with mosaic attenuation of the lungs. Diffuse peribronchovascular ground-glass airspace opacity. Similar-appearing underlying trace peripheral reticulations. No focal consolidation. No pulmonary nodule. No pulmonary mass. No pleural effusion. No pneumothorax. Musculoskeletal: No chest wall abnormality. No suspicious lytic or blastic osseous lesions. No acute displaced fracture. Multilevel degenerative changes of the spine. Review of the MIP images confirms the above findings. CTA ABDOMEN AND PELVIS FINDINGS VASCULAR Aorta: Mild atherosclerotic plaque. Normal caliber aorta without aneurysm, dissection, vasculitis or significant stenosis. Celiac: Patent without evidence of aneurysm, dissection, vasculitis or significant stenosis. SMA: Mild atherosclerotic plaque. Patent without evidence of aneurysm, dissection, vasculitis or significant stenosis. Renals: Mild atherosclerotic plaque. Both renal arteries are patent without evidence of aneurysm, dissection, vasculitis, fibromuscular dysplasia or significant stenosis. IMA: Patent without evidence of aneurysm, dissection, vasculitis or significant stenosis. Inflow: Mild atherosclerotic plaque. Patent without evidence of aneurysm, dissection, vasculitis or significant stenosis. Veins: No obvious venous abnormality within the limitations of this arterial phase study. Review of the MIP images confirms the above findings. NON-VASCULAR Hepatobiliary: No focal liver  abnormality. No gallstones, gallbladder wall thickening, or pericholecystic fluid. No biliary dilatation. Pancreas: No focal lesion. Normal pancreatic contour. No surrounding inflammatory changes. No main pancreatic ductal dilatation. Spleen: Normal in size without focal abnormality. Adrenals/Urinary Tract: No adrenal nodule bilaterally. Nonspecific trace bilateral perinephric stranding. Bilateral kidneys enhance symmetrically. Parapelvic cyst versus prominent renal pelvis on the right. Fluid density lesions within the kidneys likely represent simple renal cysts measuring up to 6 cm on the right. Couple of punctate  calcifications noted within the right kidney. No hydronephrosis. No hydroureter. The urinary bladder is unremarkable. Stomach/Bowel: Stomach is within normal limits. No evidence of small bowel wall thickening or dilatation. No large bowel dilatation. Circumferential bowel wall thickening of the descending colon. Trace pericolonic fat stranding. Appendix appears normal. Lymphatic: No lymphadenopathy. Reproductive: Status post hysterectomy. No adnexal masses. Other: No intraperitoneal free fluid. No intraperitoneal free gas. No organized fluid collection. Musculoskeletal: No abdominal wall hernia or abnormality. No suspicious lytic or blastic osseous lesions. No acute displaced fracture. Multilevel severe degenerative changes of the spine. Partially visualized proximal left femoral fracture status post surgical hardware fixation. Review of the MIP images confirms the above findings. IMPRESSION: 1. No acute vascular abnormality. Findings noted on chest x-ray likely due to patient rotation and positioning. 2. Diffuse peribronchovascular ground-glass airspace opacities may represent pulmonary edema versus infection/inflammation (such as aspiration pneumonia). 3. Descending colon colitis. Differential diagnosis for etiology includes infection, inflammation, ischemia. No findings of associated bowel perforation  or obstruction. Recommend colonoscopy status post treatment and status post complete resolution of inflammatory changes to exclude an underlying lesion. 4. Other imaging findings of potential clinical significance: Nonobstructive punctate right nephrolithiasis. Simple renal cysts within the kidneys. Electronically Signed   By: Iven Finn M.D.   On: 01/16/2021 18:16     LOS: 1 day   Oren Binet, MD  [facility]    To contact the attending provider between 7A-7P or the covering provider during after hours 7P-7A, please log into the web site [email or web address] and access using universal [place] password for that web site. If you do not have the password, please call the hospital operator.  01/17/2021, 10:59 AM

## 2021-01-17 NOTE — Progress Notes (Signed)
OT Cancellation Note  Patient Details Name: [name] MRN: [id number] DOB: [birth year]   Cancelled Treatment:    Reason Eval/Treat Not Completed: Medical issues which prohibited therapy per review of chart pt with soft BP this AM, will defer OT eval completion at this time and continue to monitor for appropriateness for participation as schedule permits.   Lynk Marti OTR/L acute rehab services Office: [phone]  01/17/2021, 9:02 PM

## 2021-01-17 NOTE — ED Notes (Signed)
Daughter Shelly Coss [phone] would like an update

## 2021-01-17 NOTE — Progress Notes (Addendum)
PT Cancellation Note  Patient Details Name: [name] MRN: [id number] DOB: Mar 10, 1941   Cancelled Treatment:    Reason Eval/Treat Not Completed: Patient not medically ready (BP 93/39 (50) pt with hypotension supine and not able to mobilize at this time) RN and MD aware   Malaya Cagley B Laurajean Hosek 01/17/2021, 8:50 AM Bayard Males, [place] Pager: [phone] Office: [phone]

## 2021-01-18 LAB — GLUCOSE, CAPILLARY
Glucose-Capillary: 139 mg/dL — ABNORMAL HIGH (ref 70–99)
Glucose-Capillary: 111 mg/dL — ABNORMAL HIGH (ref 70–99)
Glucose-Capillary: 125 mg/dL — ABNORMAL HIGH (ref 70–99)
Glucose-Capillary: 138 mg/dL — ABNORMAL HIGH (ref 70–99)

## 2021-01-18 LAB — COMPREHENSIVE METABOLIC PANEL
ALT: 25 U/L (ref 0–44)
AST: 15 U/L (ref 15–41)
Albumin: 2.2 g/dL — ABNORMAL LOW (ref 3.5–5.0)
Alkaline Phosphatase: 65 U/L (ref 38–126)
Anion gap: 6 (ref 5–15)
BUN: 13 mg/dL (ref 8–23)
CO2: 27 mmol/L (ref 22–32)
Calcium: 7.5 mg/dL — ABNORMAL LOW (ref 8.9–10.3)
Chloride: 104 mmol/L (ref 98–111)
Creatinine, Ser: 0.78 mg/dL (ref 0.44–1.00)
GFR, Estimated: >60 mL/min (ref >60)
Glucose, Bld: 137 mg/dL — ABNORMAL HIGH (ref 70–99)
Potassium: 3.9 mmol/L (ref 3.5–5.1)
Sodium: 137 mmol/L (ref 135–145)
Total Bilirubin: 0.8 mg/dL (ref 0.3–1.2)
Total Protein: 4.7 g/dL — ABNORMAL LOW (ref 6.5–8.1)

## 2021-01-18 LAB — CBC
HCT: 36.2 % (ref 36.0–46.0)
Hemoglobin: 11.4 g/dL — ABNORMAL LOW (ref 12.0–15.0)
MCH: 30.8 pg (ref 26.0–34.0)
MCHC: 31.5 g/dL (ref 30.0–36.0)
MCV: 97.8 fL (ref 80.0–100.0)
Platelets: 125 10*3/uL — ABNORMAL LOW (ref 150–400)
RBC: 3.7 MIL/uL — ABNORMAL LOW (ref 3.87–5.11)
RDW: 13.8 % (ref 11.5–15.5)
WBC: 13.1 10*3/uL — ABNORMAL HIGH (ref 4.0–10.5)
nRBC: 0 % (ref 0.0–0.2)

## 2021-01-18 MED ORDER — FUROSEMIDE 40 MG PO TABS
80.0000 mg | ORAL_TABLET | Freq: Every day | ORAL | Status: DC
  Administered 2021-01-18 – 2021-01-20 (×3): 80 mg via ORAL
  Filled 2021-01-18 (×2): qty 2
  Filled 2021-01-18: qty 4
  Filled 2021-01-18: qty 2

## 2021-01-18 MED ORDER — MORPHINE SULFATE ER 15 MG PO TBCR
15.0000 mg | EXTENDED_RELEASE_TABLET | Freq: Two times a day (BID) | ORAL | Status: DC
  Administered 2021-01-18 – 2021-01-20 (×4): 15 mg via ORAL
  Filled 2021-01-18 (×4): qty 1

## 2021-01-18 MED ORDER — POTASSIUM CHLORIDE CRYS ER 20 MEQ PO TBCR
20.0000 meq | EXTENDED_RELEASE_TABLET | Freq: Every day | ORAL | Status: DC
  Administered 2021-01-18 – 2021-01-20 (×3): 20 meq via ORAL
  Filled 2021-01-18 (×3): qty 1

## 2021-01-18 MED ORDER — PHENOL 1.4 % MT LIQD
1.0000 | OROMUCOSAL | Status: DC | PRN
  Administered 2021-01-19: 1 via OROMUCOSAL
  Filled 2021-01-18: qty 177

## 2021-01-18 MED ORDER — IRBESARTAN 150 MG PO TABS
75.0000 mg | ORAL_TABLET | Freq: Every day | ORAL | Status: DC
  Administered 2021-01-18 – 2021-01-19 (×2): 75 mg via ORAL
  Filled 2021-01-18 (×2): qty 1

## 2021-01-18 MED ORDER — PREDNISONE 20 MG PO TABS
20.0000 mg | ORAL_TABLET | Freq: Every day | ORAL | Status: DC
  Administered 2021-01-19 – 2021-01-20 (×2): 20 mg via ORAL
  Filled 2021-01-18: qty 1

## 2021-01-18 MED ORDER — MORPHINE SULFATE 15 MG PO TABS
15.0000 mg | ORAL_TABLET | Freq: Two times a day (BID) | ORAL | Status: DC | PRN
  Administered 2021-01-18 – 2021-01-19 (×3): 15 mg via ORAL
  Filled 2021-01-18 (×3): qty 1

## 2021-01-18 NOTE — Plan of Care (Signed)
  Problem: Coping: Goal: Level of anxiety will decrease Outcome: Progressing   Problem: Elimination: Goal: Will not experience complications related to bowel motility Outcome: Progressing   Problem: Pain Managment: Goal: General experience of comfort will improve Outcome: Progressing   Problem: Safety: Goal: Ability to remain free from injury will improve Outcome: Progressing

## 2021-01-18 NOTE — TOC Progression Note (Signed)
Transition of Care [facility]) - Progression Note    Patient Details  Name: [name] MRN: [id number] Date of Birth: [birth year]  Transition of Care [facility]) CM/SW [place], RN Phone Number:[phone]  01/18/2021, 2:40 PM  Clinical Narrative:    CM received message from MD to speak with patient about PT recommendations for SNF. Patient admits that she needs a little help but is not sure that SNF would be best for her. Patient would like to discuss disposition with family before being worked up for SNF. Does not want info faxed out yet.         Expected Discharge Plan and Services                                                 Social Determinants of Health (SDOH) Interventions    Readmission Risk Interventions Readmission Risk Prevention Plan 04/26/2019  Transportation Screening Complete  PCP or Specialist Appt within 3-5 Days Complete  HRI or [place] Complete  Social Work Consult for [place] Planning/Counseling Complete  Palliative Care Screening Not Applicable  Medication Review [employer] photographer) Complete  Some recent data might be hidden

## 2021-01-18 NOTE — Progress Notes (Signed)
Neck Zithromax,                        PROGRESS NOTE        PATIENT DETAILS Name: [name] Age: 80 y.o. Sex: female Date of Birth: [birth year] Admit Date: 01/16/2021 Admitting Physician Lenore Cordia, MD MOL:MBEML, Langley Adie, MD  Brief Narrative: Patient is a 80 y.o. female with history of pulmonary fibrosis/bronchiectasis-chronic hypoxic respiratory failure, HFpEF, RA on prednisone, DM-2, HTN, HLD, hypothyroidism, chronic lower extremity lymphedema, chronic pain syndrome-presented to the hospital with diarrhea, altered mental status-she was found to have sepsis physiology due to left-sided colitis.  She recently had completed a course of Augmentin for a cyst/abscess on her right breast.  She was subsequently admitted to the hospitalist service.  See below for further details.  Significant events: 8/31>> confusion/diarrhea/fever/hypotension-sepsis/encephalopathy-due to left-sided colitis.  Significant studies: 8/31>> CT head: No acute intracranial abnormality. 8/31>> CTA chest/abdomen: No acute vascular abnormality-descending colon colitis.  Antimicrobial therapy: Rocephin: 8/31>> Flagyl: 9/1>>  Microbiology data: 8/31>> influenza/COVID PCR: Negative 8/31>> blood cultures: Negative  Procedures : None  Consults: None  DVT Prophylaxis : heparin injection 5,000 Units Start: 01/16/21 2200   Subjective: Diarrhea has not reoccurred-abdominal pain improving.  Feels much better than how she first came into the hospital.  Assessment/Plan: Severe sepsis due to left-sided colitis: Sepsis physiology resolved-cultures continue to be negative-continue Rocephin/Flagyl.  Doubt C. difficile colitis given resolution of diarrhea with antimicrobial therapy and other supportive care.  Stop stress dose hydrocortisone-we will switch back to prednisone at a slightly higher dose than usual baseline.  Acute metabolic encephalopathy: Resolved-likely due to sepsis/dehydration.  CT head was  negative.  AKI: Resolved-likely hemodynamically mediated.   Pulmonary fibrosis/chronic bronchiectasis-with chronic hypoxic respiratory failure-on 2 L of oxygen at home: This appears stable-continue bronchodilators-Zithromax which she is on chronically currently on hold.  HFpEF: Euvolemic on exam-stop all IVF-resume diuretics.  Rheumatoid arthritis: Stop stress dose hydrocortisone-start prednisone 20 mg (normally on 10 mg) continue to hold Plaquenil/leflunomide in the setting of acute infection.    HTN: BP now improved and hypertensive-restart Avapro-adding diuretics-reassess on 9/2 for further optimization.    DM-2  (A1c 5.6 on 5/11): CBG stable with SSI  Recent Labs    01/17/21 2217 01/18/21 0755 01/18/21 1140  GLUCAP 174* 139* 111*    HLD: Continue statin  Hypothyroidism: Continue Synthroid-TSH within normal limits.  Chronic back pain/chronic pain syndrome: Stable on chronic narcotics/Neurontin/Cymbalta.    Debility/deconditioning/functional quadriplegia: Due to acute illness-await PT/OT eval.  Morbid Obesity: Estimated body mass index is 43.82 kg/m as calculated from the following:   Height as of this encounter: _0  (1.626 m).   Weight as of this encounter: 115.8 kg.    Diet: Diet Order             Diet full liquid Room service appropriate? Yes; Fluid consistency: Thin  Diet effective now                    Code Status: Full code   Family Communication: Spoke with Son-Curtis [phone] over phone on 9/1  Disposition Plan: Status is: Inpatient  Remains inpatient appropriate because:Inpatient level of care appropriate due to severity of illness  Dispo: The patient is from: Home              Anticipated d/c is to: Home              Patient currently is not medically  stable to d/c.   Difficult to place patient No  Barriers to Discharge: Sepsis physiology due to colitis-awaiting culture data-sepsis physiology improved but not resolved-not yet stable  for discharge.  Antimicrobial agents: Anti-infectives (From admission, onward)    Start     Dose/Rate Route Frequency Ordered Stop   01/17/21 1000  metroNIDAZOLE (FLAGYL) IVPB 500 mg        500 mg 100 mL/hr over 60 Minutes Intravenous Every 12 hours 01/17/21 0854     01/17/21 1000  cefTRIAXone (ROCEPHIN) 2 g in sodium chloride 0.9 % 100 mL IVPB        2 g 200 mL/hr over 30 Minutes Intravenous Every 24 hours 01/17/21 0854     01/16/21 1700  cefTRIAXone (ROCEPHIN) 2 g in sodium chloride 0.9 % 100 mL IVPB  Status:  Discontinued        2 g 200 mL/hr over 30 Minutes Intravenous Every 24 hours 01/16/21 1654 01/16/21 1943   01/16/21 1700  azithromycin (ZITHROMAX) 500 mg in sodium chloride 0.9 % 250 mL IVPB  Status:  Discontinued        500 mg 250 mL/hr over 60 Minutes Intravenous Every 24 hours 01/16/21 1654 01/16/21 1943        Time spent: 35 minutes-Greater than 50% of this time was spent in counseling, explanation of diagnosis, planning of further management, and coordination of care.  MEDICATIONS: Scheduled Meds:  atorvastatin  10 mg Oral QPM   DULoxetine  60 mg Oral Daily   furosemide  80 mg Oral Daily   gabapentin  600 mg Oral TID   guaiFENesin  1,200 mg Oral BID   heparin  5,000 Units Subcutaneous Q8H   insulin aspart  0-9 Units Subcutaneous TID WC   irbesartan  75 mg Oral Daily   levothyroxine  50 mcg Oral QAC breakfast   metoprolol tartrate  25 mg Oral BID   pantoprazole  40 mg Oral Daily   potassium chloride SA  20 mEq Oral Daily   [START ON 01/19/2021] predniSONE  20 mg Oral Q breakfast   sodium chloride flush  3 mL Intravenous Q12H   Continuous Infusions:  cefTRIAXone (ROCEPHIN)  IV 2 g (01/18/21 0959)   metronidazole 500 mg (01/18/21 0957)   PRN Meds:.acetaminophen **OR** acetaminophen, albuterol, diclofenac Sodium, ipratropium-albuterol, morphine, ondansetron **OR** ondansetron (ZOFRAN) IV, phenol, sodium chloride   PHYSICAL EXAM: Vital signs: Vitals:    01/18/21 0500 01/18/21 0740 01/18/21 0758 01/18/21 1140  BP:   (!) 169/103 (!) 166/100  Pulse:  (!) 109 (!) 101 90  Resp:   18 19  Temp:   98.8 F (37.1 C) 98.4 F (36.9 C)  TempSrc:   Oral Oral  SpO2:  97% 97% 97%  Weight: 115.8 kg     Height:       Filed Weights   01/17/21 0311 01/18/21 0500  Weight: 112.9 kg 115.8 kg   Body mass index is 43.82 kg/m.   Gen Exam:Alert awake-not in any distress HEENT:atraumatic, normocephalic Chest: B/L clear to auscultation anteriorly CVS:S1S2 regular Abdomen:soft-mild left-sided tenderness without any rebound. Extremities: Trace Edema Neurology: Non focal Skin: no rash  I have personally reviewed following labs and imaging studies  LABORATORY DATA: CBC: Recent Labs  Lab 01/16/21 1535 01/16/21 1557 01/16/21 1612 01/17/21 0356 01/18/21 0324  WBC 12.6*  --   --  14.6* 13.1*  NEUTROABS 8.2*  --   --   --   --   HGB 13.5 15.0 12.6 13.1 11.4*  HCT 45.7 44.0 37.0 43.2 36.2  MCV 101.1*  --   --  100.5* 97.8  PLT 161  --   --  124* 125*    Basic Metabolic Panel: Recent Labs  Lab 01/16/21 1535 01/16/21 1557 01/16/21 1612 01/17/21 0356 01/18/21 0324  NA 140 139 139 139 137  K 3.9 4.0 3.5 3.6 3.9  CL 100 99  --  106 104  CO2 32  --   --  21* 27  GLUCOSE 177* 167*  --  124* 137*  BUN 21 27*  --  17 13  CREATININE 1.61* 1.60*  --  1.05* 0.78  CALCIUM 8.2*  --   --  7.4* 7.5*  MG  --   --   --  2.0  --     GFR: Estimated Creatinine Clearance: 71.2 mL/min (by C-G formula based on SCr of 0.78 mg/dL).  Liver Function Tests: Recent Labs  Lab 01/16/21 1535 01/18/21 0324  AST 21 15  ALT 31 25  ALKPHOS 74 65  BILITOT 0.9 0.8  PROT 5.7* 4.7*  ALBUMIN 3.0* 2.2*   No results for input(s): LIPASE, AMYLASE in the last 168 hours. Recent Labs  Lab 01/16/21 1535  AMMONIA 16    Coagulation Profile: Recent Labs  Lab 01/16/21 1535  INR 1.0    Cardiac Enzymes: No results for input(s): CKTOTAL, CKMB, CKMBINDEX, TROPONINI  in the last 168 hours.  BNP (last 3 results) No results for input(s): PROBNP in the last 8760 hours.  Lipid Profile: No results for input(s): CHOL, HDL, LDLCALC, TRIG, CHOLHDL, LDLDIRECT in the last 72 hours.  Thyroid Function Tests: Recent Labs    01/16/21 1537  TSH 4.598*    Anemia Panel: No results for input(s): VITAMINB12, FOLATE, FERRITIN, TIBC, IRON, RETICCTPCT in the last 72 hours.  Urine analysis:    Component Value Date/Time   COLORURINE AMBER (A) 01/16/2021 1535   APPEARANCEUR HAZY (A) 01/16/2021 1535   LABSPEC 1.025 01/16/2021 1535   PHURINE 5.0 01/16/2021 1535   GLUCOSEU NEGATIVE 01/16/2021 1535   HGBUR NEGATIVE 01/16/2021 1535   BILIRUBINUR SMALL (A) 01/16/2021 1535   BILIRUBINUR neg 11/15/2019 1406   KETONESUR NEGATIVE 01/16/2021 1535   PROTEINUR NEGATIVE 01/16/2021 1535   UROBILINOGEN 0.2 11/28/2019 1424   NITRITE NEGATIVE 01/16/2021 1535   LEUKOCYTESUR TRACE (A) 01/16/2021 1535    Sepsis Labs: Lactic Acid, Venous    Component Value Date/Time   LATICACIDVEN 1.3 01/16/2021 1942    MICROBIOLOGY: Recent Results (from the past 240 hour(s))  Resp Panel by RT-PCR (Flu A&B, Covid) Nasopharyngeal Swab     Status: None   Collection Time: 01/16/21  3:36 PM   Specimen: Nasopharyngeal Swab; Nasopharyngeal(NP) swabs in vial transport medium  Result Value Ref Range Status   SARS Coronavirus 2 by RT PCR NEGATIVE NEGATIVE Final    Comment: (NOTE) SARS-CoV-2 target nucleic acids are NOT DETECTED.  The SARS-CoV-2 RNA is generally detectable in upper respiratory specimens during the acute phase of infection. The lowest concentration of SARS-CoV-2 viral copies this assay can detect is 138 copies/mL. A negative result does not preclude SARS-Cov-2 infection and should not be used as the sole basis for treatment or other patient management decisions. A negative result may occur with  improper specimen collection/handling, submission of specimen other than  nasopharyngeal swab, presence of viral mutation(s) within the areas targeted by this assay, and inadequate number of viral copies(<138 copies/mL). A negative result must be combined with clinical observations, patient history, and  epidemiological information. The expected result is Negative.  Fact Sheet for Patients:  EntrepreneurPulse.com.au  Fact Sheet for Healthcare Providers:  IncredibleEmployment.be  This test is no t yet approved or cleared by the [place] FDA and  has been authorized for detection and/or diagnosis of SARS-CoV-2 by FDA under an Emergency Use Authorization (EUA). This EUA will remain  in effect (meaning this test can be used) for the duration of the COVID-19 declaration under Section 564(b)(1) of the Act, 21 U.S.C.section 360bbb-3(b)(1), unless the authorization is terminated  or revoked sooner.       Influenza A by PCR NEGATIVE NEGATIVE Final   Influenza B by PCR NEGATIVE NEGATIVE Final    Comment: (NOTE) The Xpert Xpress SARS-CoV-2/FLU/RSV plus assay is intended as an aid in the diagnosis of influenza from Nasopharyngeal swab specimens and should not be used as a sole basis for treatment. Nasal washings and aspirates are unacceptable for Xpert Xpress SARS-CoV-2/FLU/RSV testing.  Fact Sheet for Patients: EntrepreneurPulse.com.au  Fact Sheet for Healthcare Providers: IncredibleEmployment.be  This test is not yet approved or cleared by the [place] FDA and has been authorized for detection and/or diagnosis of SARS-CoV-2 by FDA under an Emergency Use Authorization (EUA). This EUA will remain in effect (meaning this test can be used) for the duration of the COVID-19 declaration under Section 564(b)(1) of the Act, 21 U.S.C. section 360bbb-3(b)(1), unless the authorization is terminated or revoked.  Performed at [facility], [place] [address] [place], [place] [address]    Culture, blood (routine x 2)     Status: None (Preliminary result)   Collection Time: 01/16/21  3:50 PM   Specimen: BLOOD  Result Value Ref Range Status   Specimen Description BLOOD LEFT ANTECUBITAL  Final   Special Requests   Final    BOTTLES DRAWN AEROBIC AND ANAEROBIC Blood Culture adequate volume   Culture   Final    NO GROWTH 2 DAYS Performed at [facility], [place] [address], [place], [place] [address]    Report Status PENDING  Incomplete  Culture, blood (routine x 2)     Status: None (Preliminary result)   Collection Time: 01/16/21  3:55 PM   Specimen: BLOOD  Result Value Ref Range Status   Specimen Description BLOOD RIGHT ANTECUBITAL  Final   Special Requests   Final    BOTTLES DRAWN AEROBIC AND ANAEROBIC Blood Culture adequate volume   Culture   Final    NO GROWTH 2 DAYS Performed at [facility], [place] [address], [place], [place] [address]    Report Status PENDING  Incomplete    RADIOLOGY STUDIES/RESULTS: CT Head Wo Contrast  Result Date: 01/16/2021 CLINICAL DATA:  Mental status change. Unknown cause. has been unresponsive and out of oxygen for an hour. Pt responsive to pain EXAM: CT HEAD WITHOUT CONTRAST TECHNIQUE: Contiguous axial images were obtained from the base of the skull through the vertex without intravenous contrast. COMPARISON:  MRI head 02/28/2014 BRAIN: BRAIN Patchy and confluent areas of decreased attenuation are noted throughout the deep and periventricular white matter of the cerebral hemispheres bilaterally, compatible with chronic microvascular ischemic disease. No evidence of large-territorial acute infarction. No parenchymal hemorrhage. No mass lesion. No extra-axial collection. No mass effect or midline shift. No hydrocephalus. Basilar cisterns are patent. Vascular: No hyperdense vessel. Skull: No acute fracture or focal lesion. Sinuses/Orbits: Paranasal sinuses and mastoid air cells are clear. The orbits are unremarkable. Other: None.  IMPRESSION: No acute intracranial abnormality. Electronically  Signed   By: Iven Finn M.D.   On: 01/16/2021 18:18   DG Chest Portable 1 View  Result Date: 01/16/2021 CLINICAL DATA:  Unresponsive EXAM: PORTABLE CHEST 1 VIEW COMPARISON:  Chest radiograph 11/22/2020 FINDINGS: The heart is mildly enlarged. The mediastinum is prominent, increased from prior. Lung volumes are low. There are patchy opacities in the lung bases, right more than left. There is no significant pleural effusion. There is no pneumothorax. There is no acute osseous abnormality. IMPRESSION: 1. Widening of the mediastinum, likely exaggerated by low lung volumes and AP technique, though aortic aneurysm or dissection are possibility. Recommend CTA chest for further evaluation. 2. Patchy opacities in the lung bases may reflect atelectasis or infection. These results were called by telephone at the time of interpretation on 01/16/2021 at 4:47 pm to provider [facility] , who verbally acknowledged these results. Electronically Signed   By: Valetta Mole M.D.   On: 01/16/2021 16:48   CT Angio Chest/Abd/Pel for Dissection W and/or Wo Contrast  Result Date: 01/16/2021 CLINICAL DATA:  Thoracic aortic aneurysm (TAA) suspected widened mediastinum on CXR. Unresponsive. EXAM: CT ANGIOGRAPHY CHEST, ABDOMEN AND PELVIS TECHNIQUE: Non-contrast CT of the chest was initially obtained. Multidetector CT imaging through the chest, abdomen and pelvis was performed using the standard protocol during bolus administration of intravenous contrast. Multiplanar reconstructed images and MIPs were obtained and reviewed to evaluate the vascular anatomy. CONTRAST:  17m OMNIPAQUE IOHEXOL 350 MG/ML SOLN COMPARISON:  Chest x-ray 01/16/2021, chest x-ray 11/22/2020, CT chest 04/21/2019 FINDINGS: CTA CHEST FINDINGS Cardiovascular: Preferential opacification of the thoracic aorta. No evidence of thoracic aortic aneurysm or dissection. Mild atherosclerotic plaque of the  thoracic aorta. At least 2 vessel coronary artery calcifications. Normal heart size. No significant pericardial effusion. The main pulmonary artery is normal in caliber. No central pulmonary embolus. Mediastinum/Nodes: Prominent but nonenlarged mediastinal lymph nodes. No enlarged mediastinal, hilar, or axillary lymph nodes. Thyroid gland, trachea, and esophagus demonstrate no significant findings. Lungs/Pleura: Expiratory phase of respiration with mosaic attenuation of the lungs. Diffuse peribronchovascular ground-glass airspace opacity. Similar-appearing underlying trace peripheral reticulations. No focal consolidation. No pulmonary nodule. No pulmonary mass. No pleural effusion. No pneumothorax. Musculoskeletal: No chest wall abnormality. No suspicious lytic or blastic osseous lesions. No acute displaced fracture. Multilevel degenerative changes of the spine. Review of the MIP images confirms the above findings. CTA ABDOMEN AND PELVIS FINDINGS VASCULAR Aorta: Mild atherosclerotic plaque. Normal caliber aorta without aneurysm, dissection, vasculitis or significant stenosis. Celiac: Patent without evidence of aneurysm, dissection, vasculitis or significant stenosis. SMA: Mild atherosclerotic plaque. Patent without evidence of aneurysm, dissection, vasculitis or significant stenosis. Renals: Mild atherosclerotic plaque. Both renal arteries are patent without evidence of aneurysm, dissection, vasculitis, fibromuscular dysplasia or significant stenosis. IMA: Patent without evidence of aneurysm, dissection, vasculitis or significant stenosis. Inflow: Mild atherosclerotic plaque. Patent without evidence of aneurysm, dissection, vasculitis or significant stenosis. Veins: No obvious venous abnormality within the limitations of this arterial phase study. Review of the MIP images confirms the above findings. NON-VASCULAR Hepatobiliary: No focal liver abnormality. No gallstones, gallbladder wall thickening, or  pericholecystic fluid. No biliary dilatation. Pancreas: No focal lesion. Normal pancreatic contour. No surrounding inflammatory changes. No main pancreatic ductal dilatation. Spleen: Normal in size without focal abnormality. Adrenals/Urinary Tract: No adrenal nodule bilaterally. Nonspecific trace bilateral perinephric stranding. Bilateral kidneys enhance symmetrically. Parapelvic cyst versus prominent renal pelvis on the right. Fluid density lesions within the kidneys likely represent simple renal cysts measuring up to 6 cm on the right.  Couple of punctate calcifications noted within the right kidney. No hydronephrosis. No hydroureter. The urinary bladder is unremarkable. Stomach/Bowel: Stomach is within normal limits. No evidence of small bowel wall thickening or dilatation. No large bowel dilatation. Circumferential bowel wall thickening of the descending colon. Trace pericolonic fat stranding. Appendix appears normal. Lymphatic: No lymphadenopathy. Reproductive: Status post hysterectomy. No adnexal masses. Other: No intraperitoneal free fluid. No intraperitoneal free gas. No organized fluid collection. Musculoskeletal: No abdominal wall hernia or abnormality. No suspicious lytic or blastic osseous lesions. No acute displaced fracture. Multilevel severe degenerative changes of the spine. Partially visualized proximal left femoral fracture status post surgical hardware fixation. Review of the MIP images confirms the above findings. IMPRESSION: 1. No acute vascular abnormality. Findings noted on chest x-ray likely due to patient rotation and positioning. 2. Diffuse peribronchovascular ground-glass airspace opacities may represent pulmonary edema versus infection/inflammation (such as aspiration pneumonia). 3. Descending colon colitis. Differential diagnosis for etiology includes infection, inflammation, ischemia. No findings of associated bowel perforation or obstruction. Recommend colonoscopy status post treatment  and status post complete resolution of inflammatory changes to exclude an underlying lesion. 4. Other imaging findings of potential clinical significance: Nonobstructive punctate right nephrolithiasis. Simple renal cysts within the kidneys. Electronically Signed   By: Iven Finn M.D.   On: 01/16/2021 18:16     LOS: 2 days   Oren Binet, MD  [facility]    To contact the attending provider between 7A-7P or the covering provider during after hours 7P-7A, please log into the web site [email or web address] and access using universal  password for that web site. If you do not have the password, please call the hospital operator.  01/18/2021, 12:33 PM

## 2021-01-18 NOTE — Evaluation (Addendum)
Physical Therapy Evaluation Patient Details Name: [name] MRN: [id number] DOB: Dec 30, 1940 Today's Date: 01/18/2021   History of Present Illness  80 y.o. female admitted 8/31 with severe sepsis with lactic acidosis. PMH: pulmonary fibrosis with bronchiectasis, chronic respiratory failure with hypoxia, chronic diastolic CHF, RA, A2NK, HTN, HLD, hypothyroidism, chronic lower extremity lymphedema, gout, morbid obesity, and chronic pain.  Clinical Impression  Pt pleasant with pain in neck and flank throughout session. PT with increased time for all transfers and mobility with significant struggle for transfers and gait. PT limited to 75' walking with reliance on RW. Pt with spouse at home who cannot physically assist and aide 3hrs/day. Pt reports functional decline with decreased mobility as of late. Pt with above and below deficits who will benefit from acute therapy to maximize mobility, safety and independence. PT also states she uses O2 with walking but not at rest with noted desaturation to 86% on RA with gait and sats 97% at rest on 1L.  If additional assist available and pt demonstrates progression return home with HHPT may be feasible.  164/88 (109), HR 99-120 with gait SpO2 94% on RA at rest, drop to 86% with gait on RA, 2L to recover to 96% and 97% on 1L at rest    Follow Up Recommendations SNF;Supervision for mobility/OOB    Equipment Recommendations  None recommended by PT    Recommendations for Other Services       Precautions / Restrictions Precautions Precautions: Fall      Mobility  Bed Mobility Overal bed mobility: Needs Assistance Bed Mobility: Supine to Sit     Supine to sit: Min guard;HOB elevated     General bed mobility comments: HOb 20 degrees with increased time    Transfers Overall transfer level: Needs assistance   Transfers: Sit to/from Stand Sit to Stand: Min guard         General transfer comment: cues for hand  placement  Ambulation/Gait Ambulation/Gait assistance: Min guard Gait Distance (Feet): 22 Feet Assistive device: Rolling walker (2 wheeled) Gait Pattern/deviations: Step-through pattern;Decreased stride length;Trunk flexed   Gait velocity interpretation: <1.8 ft/sec, indicate of risk for recurrent falls General Gait Details: desaturation to 88% on RA with gait, flexed trunk with chronic back pain and cues for posture and position in RW. Increased sats to 91% with cues HR 120  Stairs            Wheelchair Mobility    Modified Rankin (Stroke Patients Only)       Balance Overall balance assessment: Needs assistance Sitting-balance support: No upper extremity supported;Feet supported Sitting balance-Leahy Scale: Fair     Standing balance support: Bilateral upper extremity supported Standing balance-Leahy Scale: Poor Standing balance comment: reliant on bil UE support of RW in standing                             Pertinent Vitals/Pain Pain Assessment: 0-10 Pain Score: 4  Pain Location: neck and left flank Pain Descriptors / Indicators: Aching;Sore Pain Intervention(s): Limited activity within patient's tolerance;Monitored during session;Repositioned    Home Living Family/patient expects to be discharged to:: Private residence Living Arrangements: Spouse/significant other;Children Available Help at Discharge: Family;Personal care attendant;Available 24 hours/day Type of Home: House Home Access: Stairs to enter Entrance Stairs-Rails: None Entrance Stairs-Number of Steps: 1 Home Layout: Two level;Able to live on main level with bedroom/bathroom Home Equipment: Gilford Rile - 2 wheels;Wheelchair - manual;Bedside commode;Cane - single point;Grab bars -  tub/shower;Shower seat;Hand held shower head      Prior Function Level of Independence: Needs assistance   Gait / Transfers Assistance Needed: RW for gait in house with WC in community  ADL's / Homemaking  Assistance Needed: aide assist for bathing and housekeeping  Comments: Live with spouse, son and has Counselling psychologist Dominance        Extremity/Trunk Assessment   Upper Extremity Assessment Upper Extremity Assessment: Generalized weakness    Lower Extremity Assessment Lower Extremity Assessment: Generalized weakness    Cervical / Trunk Assessment Cervical / Trunk Assessment: Other exceptions;Kyphotic Cervical / Trunk Exceptions: rounded shoulders  Communication   Communication: No difficulties  Cognition Arousal/Alertness: Awake/alert Behavior During Therapy: WFL for tasks assessed/performed Overall Cognitive Status: Within Functional Limits for tasks assessed                                        General Comments      Exercises     Assessment/Plan    PT Assessment Patient needs continued PT services  PT Problem List Decreased strength;Decreased mobility;Decreased safety awareness;Decreased activity tolerance;Decreased balance;Decreased knowledge of use of DME;Pain;Cardiopulmonary status limiting activity       PT Treatment Interventions Gait training;Functional mobility training;Therapeutic activities;Patient/family education;DME instruction;Therapeutic exercise    PT Goals (Current goals can be found in the Care Plan section)  Acute Rehab PT Goals Patient Stated Goal: be able to take care of myself PT Goal Formulation: With patient Time For Goal Achievement: 02/01/21 Potential to Achieve Goals: Fair    Frequency Min 3X/week   Barriers to discharge Decreased caregiver support      Co-evaluation               AM-PAC PT "6 Clicks" Mobility  Outcome Measure Help needed turning from your back to your side while in a flat bed without using bedrails?: A Little Help needed moving from lying on your back to sitting on the side of a flat bed without using bedrails?: A Little Help needed moving to and from a bed to a chair (including a  wheelchair)?: A Little Help needed standing up from a chair using your arms (e.g., wheelchair or bedside chair)?: A Little Help needed to walk in hospital room?: A Little Help needed climbing 3-5 steps with a railing? : A Lot 6 Click Score: 17    End of Session Equipment Utilized During Treatment: Gait belt Activity Tolerance: Patient tolerated treatment well Patient left: in chair;with call bell/phone within reach;with chair alarm set Nurse Communication: Mobility status PT Visit Diagnosis: Other abnormalities of gait and mobility (R26.89);Muscle weakness (generalized) (M62.81)    Time: 0722-0800 PT Time Calculation (min) (ACUTE ONLY): 38 min   Charges:   PT Evaluation $PT Eval Moderate Complexity: 1 Mod PT Treatments $Gait Training: 8-22 mins $Therapeutic Activity: 8-22 mins        Enya Bureau P, PT Acute Rehabilitation Services Pager: [phone] Office: [place] 01/18/2021, 8:04 AM

## 2021-01-18 NOTE — Plan of Care (Signed)

## 2021-01-18 NOTE — Evaluation (Signed)
Occupational Therapy Evaluation Patient Details Name: [name] MRN: [id number] DOB: 04-15-1941 Today's Date: 01/18/2021    History of Present Illness 80 y.o. female admitted 8/31 with severe sepsis with lactic acidosis. PMH: pulmonary fibrosis with bronchiectasis, chronic respiratory failure with hypoxia, chronic diastolic CHF, RA, S8NI w/ neuropathy, HTN, HLD, hypothyroidism, chronic lower extremity lymphedema, gout, morbid obesity, and chronic pain.   Clinical Impression   PTA patient was living with her spouse and son (works during the day) and was grossly Mod I with ADLs and use of rollator. Patient has private pay PCA for assist with IADLs including cooking/cleaning. Patient currently functioning below baseline demonstrating observed ADLs including grooming standing at sink level and LB dressing with Min guard to Mod A. Patient also limited by deficits listed below including generalized weakness, decreased activity tolerance, decreased cardiopulmonary status and decreased balance and would benefit from continued acute OT services in prep for safe d/c to next level of care.     Follow Up Recommendations  SNF    Equipment Recommendations  None recommended by OT (Patient has necessary DME)    Recommendations for Other Services       Precautions / Restrictions Precautions Precautions: Fall Precaution Comments: Hx of falls Restrictions Weight Bearing Restrictions: No      Mobility Bed Mobility Overal bed mobility: Needs Assistance Bed Mobility: Supine to Sit     Supine to sit: Min guard;HOB elevated     General bed mobility comments: Patient seated in recliner upon entry.    Transfers Overall transfer level: Needs assistance Equipment used: Rolling walker (2 wheeled) Transfers: Sit to/from Stand Sit to Stand: Min guard         General transfer comment: cues for hand placement    Balance Overall balance assessment: Needs assistance Sitting-balance support:  No upper extremity supported;Feet supported Sitting balance-Leahy Scale: Fair     Standing balance support: Bilateral upper extremity supported Standing balance-Leahy Scale: Poor Standing balance comment: reliant on bil UE support of RW in standing; bilateral forearms resting on sink surface during grooming tasks                           ADL either performed or assessed with clinical judgement   ADL Overall ADL's : Needs assistance/impaired Eating/Feeding: Independent   Grooming: Min guard;Standing Grooming Details (indicate cue type and reason): 3/3 grooming tasks standing at sink level with bilateral forearms resting on sink surface. Min guard for safety.             Lower Body Dressing: Moderate assistance Lower Body Dressing Details (indicate cue type and reason): Max A to don footwear (baseline). Toilet Transfer: Designer, television/film set Details (indicate cue type and reason): Simulated with transfer from sink to recliner with use of RW.                 Vision Baseline Vision/History: 1 Wears glasses Ability to See in Adequate Light: 0 Adequate Patient Visual Report: No change from baseline Vision Assessment?: No apparent visual deficits     Perception     Praxis      Pertinent Vitals/Pain Pain Assessment: Faces Pain Score: 4  Faces Pain Scale: Hurts a little bit Pain Location: neck and left flank and low back radiating into L leg Pain Descriptors / Indicators: Aching;Sore Pain Intervention(s): Limited activity within patient's tolerance;Monitored during session;Repositioned     Hand Dominance Right   Extremity/Trunk Assessment Upper Extremity Assessment Upper  Extremity Assessment: Generalized weakness   Lower Extremity Assessment Lower Extremity Assessment: Defer to PT evaluation   Cervical / Trunk Assessment Cervical / Trunk Assessment: Other exceptions Cervical / Trunk Exceptions: Large body habitus   Communication  Communication Communication: No difficulties   Cognition Arousal/Alertness: Awake/alert Behavior During Therapy: WFL for tasks assessed/performed Overall Cognitive Status: Within Functional Limits for tasks assessed                                     General Comments  Patient reports use of O2 PRN at home (mostly at night or with increased activity). O2 removed for grooming standing at sink level. SpO2 94-96% throughout on RA. Placed back on 2L at conclusion of session per patient request with SpO2 returning to 99%.    Exercises     Shoulder Instructions      Home Living Family/patient expects to be discharged to:: Private residence Living Arrangements: Spouse/significant other;Children Available Help at Discharge: Family;Personal care attendant;Available 24 hours/day Type of Home: House Home Access: Stairs to enter [employer] of Steps: 1 Entrance Stairs-Rails: None Home Layout: Two level;Able to live on main level with bedroom/bathroom     Bathroom Shower/Tub: Walk-in shower   Bathroom Toilet: Handicapped height     Home Equipment: Environmental consultant - 2 wheels;Wheelchair - manual;Bedside commode;Cane - single point;Grab bars - tub/shower;Shower seat;Hand held shower head;Other (comment);Walker - 4 wheels (1L home O2 PRN)          Prior Functioning/Environment Level of Independence: Needs assistance  Gait / Transfers Assistance Needed: Rollator for household mobility and wc in community dwellings ADL's / Homemaking Assistance Needed: Mod I with bathing/dressing/toileting; aide assists with IADLs including meal prep and housekeeping   Comments: Live with spouse and son; private pay for PCA        OT Problem List: Decreased strength;Decreased activity tolerance;Impaired balance (sitting and/or standing);Cardiopulmonary status limiting activity;Pain      OT Treatment/Interventions: Self-care/ADL training;Therapeutic exercise;Energy conservation;DME  and/or AE instruction;Therapeutic activities;Patient/family education;Balance training    OT Goals(Current goals can be found in the care plan section) Acute Rehab OT Goals Patient Stated Goal: be able to take care of myself OT Goal Formulation: With patient Time For Goal Achievement: 02/01/21 Potential to Achieve Goals: Good ADL Goals Pt Will Perform Upper Body Bathing: with modified independence;sitting Pt Will Perform Lower Body Bathing: with modified independence;sitting/lateral leans Pt Will Perform Upper Body Dressing: with modified independence;sitting Pt Will Perform Lower Body Dressing: with modified independence;sitting/lateral leans;sit to/from stand;with adaptive equipment Pt Will Transfer to Toilet: with modified independence;ambulating;bedside commode Pt Will Perform Toileting - Clothing Manipulation and hygiene: with modified independence;sitting/lateral leans;sit to/from stand Pt Will Perform Tub/Shower Transfer: Shower transfer;shower seat;rolling walker Pt/caregiver will Perform Home Exercise Program: Increased strength;Both right and left upper extremity Additional ADL Goal #1: Patient will tolerate 15 minutes of therapeutic activity without need for rest break indicating increased activity tolerance.  OT Frequency: Min 2X/week   Barriers to D/C:            Co-evaluation              AM-PAC OT "6 Clicks" Daily Activity     Outcome Measure Help from another person eating meals?: None Help from another person taking care of personal grooming?: A Little Help from another person toileting, which includes using toliet, bedpan, or urinal?: A Little Help from another person bathing (including washing, rinsing, drying)?:  A Little Help from another person to put on and taking off regular upper body clothing?: A Little Help from another person to put on and taking off regular lower body clothing?: A Lot 6 Click Score: 18   End of Session Equipment Utilized During  Treatment: Gait belt;Rolling walker Nurse Communication: Mobility status  Activity Tolerance: Patient tolerated treatment well Patient left: in chair;with call bell/phone within reach;with chair alarm set  OT Visit Diagnosis: Unsteadiness on feet (R26.81);Muscle weakness (generalized) (M62.81);History of falling (Z91.81)                Time: 0263-7858 OT Time Calculation (min): 26 min Charges:  OT General Charges $OT Visit: 1 Visit OT Evaluation $OT Eval Moderate Complexity: 1 Mod OT Treatments $Self Care/Home Management : 8-22 mins  Paiden Cavell H. OTR/L Supplemental OT, Department of rehab services [phone]  Jasminne Mealy R H. 01/18/2021, 10:54 AM

## 2021-01-19 DIAGNOSIS — E039 Hypothyroidism, unspecified: Secondary | ICD-10-CM

## 2021-01-19 LAB — COMPREHENSIVE METABOLIC PANEL
ALT: 23 U/L (ref 0–44)
AST: 14 U/L — ABNORMAL LOW (ref 15–41)
Albumin: 2.3 g/dL — ABNORMAL LOW (ref 3.5–5.0)
Alkaline Phosphatase: 60 U/L (ref 38–126)
Anion gap: 8 (ref 5–15)
BUN: 11 mg/dL (ref 8–23)
CO2: 32 mmol/L (ref 22–32)
Calcium: 7.9 mg/dL — ABNORMAL LOW (ref 8.9–10.3)
Chloride: 101 mmol/L (ref 98–111)
Creatinine, Ser: 0.79 mg/dL (ref 0.44–1.00)
GFR, Estimated: >60 mL/min (ref >60)
Glucose, Bld: 93 mg/dL (ref 70–99)
Potassium: 3.6 mmol/L (ref 3.5–5.1)
Sodium: 141 mmol/L (ref 135–145)
Total Bilirubin: 0.6 mg/dL (ref 0.3–1.2)
Total Protein: 4.7 g/dL — ABNORMAL LOW (ref 6.5–8.1)

## 2021-01-19 LAB — CBC
HCT: 33.5 % — ABNORMAL LOW (ref 36.0–46.0)
Hemoglobin: 10.4 g/dL — ABNORMAL LOW (ref 12.0–15.0)
MCH: 30.2 pg (ref 26.0–34.0)
MCHC: 31 g/dL (ref 30.0–36.0)
MCV: 97.4 fL (ref 80.0–100.0)
Platelets: 125 10*3/uL — ABNORMAL LOW (ref 150–400)
RBC: 3.44 MIL/uL — ABNORMAL LOW (ref 3.87–5.11)
RDW: 13.8 % (ref 11.5–15.5)
WBC: 9 10*3/uL (ref 4.0–10.5)
nRBC: 0 % (ref 0.0–0.2)

## 2021-01-19 LAB — GLUCOSE, CAPILLARY
Glucose-Capillary: 91 mg/dL (ref 70–99)
Glucose-Capillary: 116 mg/dL — ABNORMAL HIGH (ref 70–99)
Glucose-Capillary: 148 mg/dL — ABNORMAL HIGH (ref 70–99)
Glucose-Capillary: 149 mg/dL — ABNORMAL HIGH (ref 70–99)

## 2021-01-19 MED ORDER — HYDRALAZINE HCL 20 MG/ML IJ SOLN
10.0000 mg | Freq: Four times a day (QID) | INTRAMUSCULAR | Status: DC | PRN
  Administered 2021-01-19: 10 mg via INTRAVENOUS
  Filled 2021-01-19: qty 1

## 2021-01-19 MED ORDER — POLYETHYLENE GLYCOL 3350 17 G PO PACK
17.0000 g | PACK | Freq: Every day | ORAL | Status: DC
  Administered 2021-01-19: 17 g via ORAL
  Filled 2021-01-19: qty 1

## 2021-01-19 NOTE — Progress Notes (Signed)
Neck Zithromax,                        PROGRESS NOTE        PATIENT DETAILS Name: [name] Age: 80 y.o. Sex: female Date of Birth: 07/10/1940 Admit Date: 01/16/2021 Admitting Physician Lenore Cordia, MD PIR:JJOAC, Langley Adie, MD  Brief Narrative: Patient is a 80 y.o. female with history of pulmonary fibrosis/bronchiectasis-chronic hypoxic respiratory failure, HFpEF, RA on prednisone, DM-2, HTN, HLD, hypothyroidism, chronic lower extremity lymphedema, chronic pain syndrome-presented to the hospital with diarrhea, altered mental status-she was found to have sepsis physiology due to left-sided colitis.  She recently had completed a course of Augmentin for a cyst/abscess on her right breast.  She was subsequently admitted to the hospitalist service.  See below for further details.  Significant events: 8/31>> confusion/diarrhea/fever/hypotension-sepsis/encephalopathy-due to left-sided colitis.  Significant studies: 8/31>> CT head: No acute intracranial abnormality. 8/31>> CTA chest/abdomen: No acute vascular abnormality-descending colon colitis.  Antimicrobial therapy: Rocephin: 8/31>> Flagyl: 9/1>>  Microbiology data: 8/31>> influenza/COVID PCR: Negative 8/31>> blood cultures: Negative  Procedures : None  Consults: None  DVT Prophylaxis : heparin injection 5,000 Units Start: 01/16/21 2200   Subjective: She feels much better-significant less abdominal pain compared to yesterday.  Assessment/Plan: Severe sepsis due to left-sided colitis: Sepsis has resolved-significant improvement in colitis symptoms-pain improved-diarrhea has resolved.  Continue Rocephin/Flagyl for 1 more day before we can transition to oral antimicrobial therapy.  Doubt C. difficile colitis given resolution of diarrhea with antimicrobial therapy and other supportive care.    Acute metabolic encephalopathy: Resolved-likely due to sepsis/dehydration.  CT head was negative.  AKI: Resolved-likely  hemodynamically mediated.   Pulmonary fibrosis/chronic bronchiectasis-with chronic hypoxic respiratory failure-on 2 L of oxygen at home: This appears stable-continue bronchodilators-Zithromax which she is on chronically currently on hold.  HFpEF: Euvolemic on exam-stop all IVF-has been resumed on her usual regimen of diuretics.    Rheumatoid arthritis: Stop stress dose hydrocortisone-start prednisone 20 mg (normally on 10 mg) continue to hold Plaquenil/leflunomide in the setting of acute infection.    HTN: BP better-continue Avapro/diuretics.  Reassess on 9/3 for further optimization.   DM-2  (A1c 5.6 on 5/11): CBG stable with SSI  Recent Labs    01/18/21 1611 01/18/21 2114 01/19/21 0758  GLUCAP 125* 138* 91     HLD: Continue statin  Hypothyroidism: Continue Synthroid-TSH within normal limits.  Chronic back pain/chronic pain syndrome: Stable on chronic narcotics/Neurontin/Cymbalta.    Debility/deconditioning/functional quadriplegia: Due to acute illness-appreciate evaluation by rehab services-patient not interested in SNF and wants to go home with maximal home health services.  Morbid Obesity: Estimated body mass index is 43.4 kg/m as calculated from the following:   Height as of this encounter: 5' 4" (1.626 m).   Weight as of this encounter: 114.7 kg.    Diet: Diet Order             Diet full liquid Room service appropriate? Yes; Fluid consistency: Thin  Diet effective now                    Code Status: Full code   Family Communication: Spoke with Son-Curtis [phone] over phone on 9/1  Disposition Plan: Status is: Inpatient  Remains inpatient appropriate because:Inpatient level of care appropriate due to severity of illness  Dispo: The patient is from: Home              Anticipated d/c is to: Home  Patient currently is not medically stable to d/c.   Difficult to place patient No  Barriers to Discharge: Resolving sepsis physiology and  colitis-monitoring 1 more day in the hospital-discussed with case management whether patient will go to SNF for home with home health services.  Antimicrobial agents: Anti-infectives (From admission, onward)    Start     Dose/Rate Route Frequency Ordered Stop   01/17/21 1000  metroNIDAZOLE (FLAGYL) IVPB 500 mg        500 mg 100 mL/hr over 60 Minutes Intravenous Every 12 hours 01/17/21 0854     01/17/21 1000  cefTRIAXone (ROCEPHIN) 2 g in sodium chloride 0.9 % 100 mL IVPB        2 g 200 mL/hr over 30 Minutes Intravenous Every 24 hours 01/17/21 0854     01/16/21 1700  cefTRIAXone (ROCEPHIN) 2 g in sodium chloride 0.9 % 100 mL IVPB  Status:  Discontinued        2 g 200 mL/hr over 30 Minutes Intravenous Every 24 hours 01/16/21 1654 01/16/21 1943   01/16/21 1700  azithromycin (ZITHROMAX) 500 mg in sodium chloride 0.9 % 250 mL IVPB  Status:  Discontinued        500 mg 250 mL/hr over 60 Minutes Intravenous Every 24 hours 01/16/21 1654 01/16/21 1943        Time spent: 25 minutes-Greater than 50% of this time was spent in counseling, explanation of diagnosis, planning of further management, and coordination of care.  MEDICATIONS: Scheduled Meds:  atorvastatin  10 mg Oral QPM   DULoxetine  60 mg Oral Daily   furosemide  80 mg Oral Daily   gabapentin  600 mg Oral TID   guaiFENesin  1,200 mg Oral BID   heparin  5,000 Units Subcutaneous Q8H   insulin aspart  0-9 Units Subcutaneous TID WC   irbesartan  75 mg Oral Daily   levothyroxine  50 mcg Oral QAC breakfast   metoprolol tartrate  25 mg Oral BID   morphine  15 mg Oral Q12H   pantoprazole  40 mg Oral Daily   potassium chloride SA  20 mEq Oral Daily   predniSONE  20 mg Oral Q breakfast   sodium chloride flush  3 mL Intravenous Q12H   Continuous Infusions:  cefTRIAXone (ROCEPHIN)  IV 2 g (01/19/21 0859)   metronidazole 500 mg (01/19/21 0900)   PRN Meds:.acetaminophen **OR** acetaminophen, albuterol, diclofenac Sodium,  ipratropium-albuterol, morphine, ondansetron **OR** ondansetron (ZOFRAN) IV, phenol, sodium chloride   PHYSICAL EXAM: Vital signs: Vitals:   01/18/21 1900 01/19/21 0030 01/19/21 0407 01/19/21 0759  BP: (!) 163/94   (!) 152/76  Pulse: 100 89 96 98  Resp:    15  Temp: 98.7 F (37.1 C) 99 F (37.2 C) 98.5 F (36.9 C) 98.4 F (36.9 C)  TempSrc: Oral Oral Oral Oral  SpO2:  98% 96%   Weight:   114.7 kg   Height:       Filed Weights   01/17/21 0311 01/18/21 0500 01/19/21 0407  Weight: 112.9 kg 115.8 kg 114.7 kg   Body mass index is 43.4 kg/m.   Gen Exam:Alert awake-not in any distress HEENT:atraumatic, normocephalic Chest: B/L clear to auscultation anteriorly CVS:S1S2 regular Abdomen:soft non tender, non distended Extremities:no edema Neurology: Non focal Skin: no rash   I have personally reviewed following labs and imaging studies  LABORATORY DATA: CBC: Recent Labs  Lab 01/16/21 1535 01/16/21 1557 01/16/21 1612 01/17/21 0356 01/18/21 0324 01/19/21 0211  WBC 12.6*  --   --  14.6* 13.1* 9.0  NEUTROABS 8.2*  --   --   --   --   --   HGB 13.5 15.0 12.6 13.1 11.4* 10.4*  HCT 45.7 44.0 37.0 43.2 36.2 33.5*  MCV 101.1*  --   --  100.5* 97.8 97.4  PLT 161  --   --  124* 125* 125*     Basic Metabolic Panel: Recent Labs  Lab 01/16/21 1535 01/16/21 1557 01/16/21 1612 01/17/21 0356 01/18/21 0324 01/19/21 0211  NA 140 139 139 139 137 141  K 3.9 4.0 3.5 3.6 3.9 3.6  CL 100 99  --  106 104 101  CO2 32  --   --  21* 27 32  GLUCOSE 177* 167*  --  124* 137* 93  BUN 21 27*  --  _0 CREATININE 1.61* 1.60*  --  1.05* 0.78 0.79  CALCIUM 8.2*  --   --  7.4* 7.5* 7.9*  MG  --   --   --  2.0  --   --      GFR: Estimated Creatinine Clearance: 70.8 mL/min (by C-G formula based on SCr of 0.79 mg/dL).  Liver Function Tests: Recent Labs  Lab 01/16/21 1535 01/18/21 0324 01/19/21 0211  AST 21 15 14*  ALT _1 ALKPHOS 74 65 60  BILITOT 0.9 0.8 0.6  PROT  5.7* 4.7* 4.7*  ALBUMIN 3.0* 2.2* 2.3*    No results for input(s): LIPASE, AMYLASE in the last 168 hours. Recent Labs  Lab 01/16/21 1535  AMMONIA 16     Coagulation Profile: Recent Labs  Lab 01/16/21 1535  INR 1.0     Cardiac Enzymes: No results for input(s): CKTOTAL, CKMB, CKMBINDEX, TROPONINI in the last 168 hours.  BNP (last 3 results) No results for input(s): PROBNP in the last 8760 hours.  Lipid Profile: No results for input(s): CHOL, HDL, LDLCALC, TRIG, CHOLHDL, LDLDIRECT in the last 72 hours.  Thyroid Function Tests: Recent Labs    01/16/21 1537  TSH 4.598*     Anemia Panel: No results for input(s): VITAMINB12, FOLATE, FERRITIN, TIBC, IRON, RETICCTPCT in the last 72 hours.  Urine analysis:    Component Value Date/Time   COLORURINE AMBER (A) 01/16/2021 1535   APPEARANCEUR HAZY (A) 01/16/2021 1535   LABSPEC 1.025 01/16/2021 1535   PHURINE 5.0 01/16/2021 1535   GLUCOSEU NEGATIVE 01/16/2021 1535   HGBUR NEGATIVE 01/16/2021 1535   BILIRUBINUR SMALL (A) 01/16/2021 1535   BILIRUBINUR neg 11/15/2019 1406   KETONESUR NEGATIVE 01/16/2021 1535   PROTEINUR NEGATIVE 01/16/2021 1535   UROBILINOGEN 0.2 11/28/2019 1424   NITRITE NEGATIVE 01/16/2021 1535   LEUKOCYTESUR TRACE (A) 01/16/2021 1535    Sepsis Labs: Lactic Acid, Venous    Component Value Date/Time   LATICACIDVEN 1.3 01/16/2021 1942    MICROBIOLOGY: Recent Results (from the past 240 hour(s))  Resp Panel by RT-PCR (Flu A&B, Covid) Nasopharyngeal Swab     Status: None   Collection Time: 01/16/21  3:36 PM   Specimen: Nasopharyngeal Swab; Nasopharyngeal(NP) swabs in vial transport medium  Result Value Ref Range Status   SARS Coronavirus 2 by RT PCR NEGATIVE NEGATIVE Final    Comment: (NOTE) SARS-CoV-2 target nucleic acids are NOT DETECTED.  The SARS-CoV-2 RNA is generally detectable in upper respiratory specimens during the acute phase of infection. The lowest concentration of SARS-CoV-2 viral  copies this assay can detect is 138 copies/mL. A negative result does not preclude SARS-Cov-2 infection and should not  be used as the sole basis for treatment or other patient management decisions. A negative result may occur with  improper specimen collection/handling, submission of specimen other than nasopharyngeal swab, presence of viral mutation(s) within the areas targeted by this assay, and inadequate number of viral copies(<138 copies/mL). A negative result must be combined with clinical observations, patient history, and epidemiological information. The expected result is Negative.  Fact Sheet for Patients:  EntrepreneurPulse.com.au  Fact Sheet for Healthcare Providers:  IncredibleEmployment.be  This test is no t yet approved or cleared by the [place] FDA and  has been authorized for detection and/or diagnosis of SARS-CoV-2 by FDA under an Emergency Use Authorization (EUA). This EUA will remain  in effect (meaning this test can be used) for the duration of the COVID-19 declaration under Section 564(b)(1) of the Act, 21 U.S.C.section 360bbb-3(b)(1), unless the authorization is terminated  or revoked sooner.       Influenza A by PCR NEGATIVE NEGATIVE Final   Influenza B by PCR NEGATIVE NEGATIVE Final    Comment: (NOTE) The Xpert Xpress SARS-CoV-2/FLU/RSV plus assay is intended as an aid in the diagnosis of influenza from Nasopharyngeal swab specimens and should not be used as a sole basis for treatment. Nasal washings and aspirates are unacceptable for Xpert Xpress SARS-CoV-2/FLU/RSV testing.  Fact Sheet for Patients: EntrepreneurPulse.com.au  Fact Sheet for Healthcare Providers: IncredibleEmployment.be  This test is not yet approved or cleared by the [place] FDA and has been authorized for detection and/or diagnosis of SARS-CoV-2 by FDA under an Emergency Use Authorization (EUA). This  EUA will remain in effect (meaning this test can be used) for the duration of the COVID-19 declaration under Section 564(b)(1) of the Act, 21 U.S.C. section 360bbb-3(b)(1), unless the authorization is terminated or revoked.  Performed at [facility], [place] [address] [place], [place] [address]   Culture, blood (routine x 2)     Status: None (Preliminary result)   Collection Time: 01/16/21  3:50 PM   Specimen: BLOOD  Result Value Ref Range Status   Specimen Description BLOOD LEFT ANTECUBITAL  Final   Special Requests   Final    BOTTLES DRAWN AEROBIC AND ANAEROBIC Blood Culture adequate volume   Culture   Final    NO GROWTH 3 DAYS Performed at [facility], [place] [address], [place], [place] [address]    Report Status PENDING  Incomplete  Culture, blood (routine x 2)     Status: None (Preliminary result)   Collection Time: 01/16/21  3:55 PM   Specimen: BLOOD  Result Value Ref Range Status   Specimen Description BLOOD RIGHT ANTECUBITAL  Final   Special Requests   Final    BOTTLES DRAWN AEROBIC AND ANAEROBIC Blood Culture adequate volume   Culture   Final    NO GROWTH 3 DAYS Performed at [facility], [place] [address], [place], [place] [address]    Report Status PENDING  Incomplete    RADIOLOGY STUDIES/RESULTS: No results found.   LOS: 3 days   Oren Binet, MD  [facility]    To contact the attending provider between 7A-7P or the covering provider during after hours 7P-7A, please log into the web site [email or web address] and access using universal [place] password for that web site. If you do not have the password, please call the hospital operator.  01/19/2021, 11:33 AM

## 2021-01-19 NOTE — Progress Notes (Signed)
HOSPITAL MEDICINE OVERNIGHT EVENT NOTE    Notified by nursing that patient's blood pressure this evening has reached as high as 194/101.  This is been repeated and confirmed via manual cuff.    Patient is specifically inquiring as to why she is currently receiving metoprolol twice daily when she receives metoprolol once daily at home.  Chart reviewed.  I asked nursing to explain to her that she typically takes metoprolol succinate at home which is traditionally every 24 hours.  While here she is currently on metoprolol tartrate here which is typically dosed every 12 hours.  Concerning the markedly elevated blood pressure currently, will prescribe as needed hydralazine for markedly elevated blood pressures going forward.    Joan Emerald  MD [facility]

## 2021-01-19 NOTE — TOC Initial Note (Signed)
Transition of Care [facility]) - Initial/Assessment Note    Patient Details  Name: [name] MRN: [id number] Date of Birth: Feb 12, 1941  Transition of Care [facility]) CM/SW Contact:    Bartholomew Crews, RN Phone Number: [phone] 01/19/2021, 1:05 PM  Clinical Narrative:                  Spoke with patient at the bedside to discuss transition planning. PTA home with spouse and son, [name]. Stated that [name] works during the day, but is otherwise available to care for her and her husband. Daughter, [name], assists as well. Patient has a caregiver for 3 hours/day from [facility] through [place], but also supplements additional hours as needed through private pay. Has all needed DME to include RW, rollator, and 3N1. Patient also had home oxygen including portable through [place]. PCP verified in Epic. Pharmacy in [place] is her back up pharmacy, but usually uses [place]. Demographics verified.   Discussed recommendations for [facility]. Discussed choice of home health agency. Reviewed ratings on StartupExpense.be. Referral accepted by [facility] for RN, PT, OT. Orders requested.   Family to provide transportation home via private vehicle.   TOC following.   Expected Discharge Plan: [place] Barriers to Discharge: Continued Medical Work up   Patient Goals and CMS Choice Patient states their goals for this hospitalization and ongoing recovery are:: return home with husband and son CMS Medicare.gov Compare Post Acute Care list provided to:: Patient Choice offered to / list presented to : Patient  Expected Discharge Plan and Services Expected Discharge Plan: [place] In-house Referral: NA Discharge Planning Services: CM Consult Post Acute Care Choice: [place] arrangements for the past 2 months: Single Family Home                 DME Arranged: N/A DME Agency: NA       HH Arranged: RN, OT, PT [place] Agency: [place] Date [place]:  01/19/21 Time HH Agency Contacted: 34 Representative spoke with at [place]: Malachy Mood  Prior Living Arrangements/Services Living arrangements for the past 2 months: [place] with:: Self, Adult Children, Spouse Patient language and need for interpreter reviewed:: Yes Do you feel safe going back to the place where you live?: Yes      Need for Family Participation in Patient Care: Yes (Comment) Care giver support system in place?: Yes (comment) Current home services: DME (rollator, 3N1, RW, home oxygen with [employer]) Criminal Activity/Legal Involvement Pertinent to Current Situation/Hospitalization: No - Comment as needed  Activities of Daily Living Home Assistive Devices/Equipment: Walker (specify type) (seated) ADL Screening (condition at time of admission) Patient's cognitive ability adequate to safely complete daily activities?: Yes Is the patient deaf or have difficulty hearing?: No Does the patient have difficulty seeing, even when wearing glasses/contacts?: No Does the patient have difficulty concentrating, remembering, or making decisions?: No Patient able to express need for assistance with ADLs?: Yes Does the patient have difficulty dressing or bathing?: No Independently performs ADLs?: Yes (appropriate for developmental age) Does the patient have difficulty walking or climbing stairs?: Yes Weakness of Legs: Both Weakness of Arms/Hands: Both  Permission Sought/Granted Permission sought to share information with : Family Supports    Share Information with NAME: Rosita Fire     Permission granted to share info w Relationship: son  Permission granted to share info w Contact Information: [phone]  Emotional Assessment Appearance:: Appears stated age Attitude/Demeanor/Rapport:  Engaged Affect (typically observed): Accepting Orientation: : Oriented to Self, Oriented to Place, Oriented to  Time, Oriented to Situation Alcohol / Substance Use: Not  Applicable Psych Involvement: No (comment)  Admission diagnosis:  Metabolic encephalopathy [S17.79] Dehydration [E86.0] Colitis [K52.9] Multifocal pneumonia [J18.9] Sepsis without acute organ dysfunction, due to unspecified organism ([place]) [A41.9] Severe sepsis with lactic acidosis ([place]) [A41.9, E87.2, R65.20] Patient Active Problem List   Diagnosis Date Noted   Severe sepsis with lactic acidosis ([place]) 01/16/2021   Colitis 39/07/90   Acute metabolic encephalopathy 33/00/7622   Right lower lobe pneumonia 11/22/2020   Pre-operative respiratory examination 11/22/2020   Diabetic neuropathy ([place]) 05/24/2019   Sepsis secondary to UTI ([place]) 04/25/2019   Cellulitis in diabetic foot ([place]) 04/25/2019   Hypokalemia 04/21/2019   Anticoagulated 06/25/2018   Chronic pain 06/25/2018   CRI (chronic renal insufficiency), stage 3 (moderate) (HCC) 06/25/2018   Drug-induced constipation 06/18/2018   DVT (deep venous thrombosis) (HCC) 06/03/2018   Syncope and collapse 06/02/2018   Chest pain 06/02/2018   Primary osteoarthritis of both knees 05/26/2018   Body mass index (BMI) of 50-59.9 in adult ([place]) 05/26/2018   Acquired hypothyroidism 05/26/2018   Chronic bilateral low back pain without sciatica 02/17/2018   Astigmatism with presbyopia, bilateral 03/26/2017   Cortical age-related cataract of both eyes 03/26/2017   Family history of glaucoma 03/26/2017   Long term current use of oral hypoglycemic drug 03/26/2017   Nuclear sclerotic cataract of both eyes 03/26/2017   Pain in left hip 02/18/2017   Type 2 diabetes mellitus ([place]) 01/16/2017   Closed nondisplaced intertrochanteric fracture of left femur with delayed healing 01/14/2017   Postoperative wound infection 01/14/2017   Gout 11/29/2016   Depression 11/29/2016   Closed left hip fracture ([place]) 11/29/2016   GERD (gastroesophageal reflux disease) 11/29/2016   Closed intertrochanteric fracture of hip, left, initial encounter ([place]) 11/29/2016    Physical deconditioning 07/31/2015   Pulmonary fibrosis, postinflammatory (HCC) 07/31/2015   Bronchiectasis without complication ([place]) 63/33/5456   Tendinitis of left rotator cuff 06/13/2015   Chronic diastolic CHF (congestive heart failure) ([place]) 04/21/2015   AKI (acute kidney injury) ([place])    Acute kidney injury superimposed on chronic kidney disease ([place]) 04/17/2015   Hypotension 04/17/2015   Chronic respiratory failure with hypoxia ([place]) 02/09/2015   Dyspnea and respiratory abnormality 01/03/2015   Normal coronary arteries 11/02/2014   Morbid obesity ([place]) 10/30/2014   Dysphagia 08/21/2014   Chronic fatigue 06/21/2014   DOE (dyspnea on exertion) 05/24/2014   Primary osteoarthritis of left knee 04/26/2014   High risk medication use 04/17/2014   Aphasia 02/12/2014   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 ([place]) 01/18/2014   Lumbar facet arthropathy 01/18/2014   Bilateral edema of lower extremity 05/30/2013   Chronic cough 05/24/2013   ILD (interstitial lung disease) ([place]) 04/10/2013   Greater trochanteric bursitis of left hip 08/13/2012   Spinal stenosis of lumbar region with radiculopathy 07/12/2012   Inability to walk 07/12/2012   Asymptomatic bacteriuria 07/12/2012   Meningioma of left sphenoid wing involving cavernous sinus (HCC) 02/17/2012   Chest pain of uncertain etiology 25/63/8937   Rheumatoid arthritis ([place]) 01/28/2012   Primary osteoarthritis of right knee 01/28/2012   Dyslipidemia    Thyroid disease    Essential hypertension    PCP:  [name], MD Pharmacy:   [facility] [place], [place] - [address] [place]  [place] [address] Phone: [phone] Fax: [phone]  [facility] [place], [place] - [address] [place] [address] Phone: [phone] Fax: [phone]     Social Determinants of Health (SDOH)  Interventions    Readmission Risk Interventions Readmission Risk Prevention Plan 04/26/2019  Transportation Screening Complete  PCP or Specialist Appt within 3-5 Days Complete  HRI or [place] Complete  Social Work Consult for [place] Planning/Counseling Complete  Palliative Care Screening Not Applicable  Medication Review Press photographer) Complete  Some recent data might be hidden

## 2021-01-20 DIAGNOSIS — K529 Noninfective gastroenteritis and colitis, unspecified: Secondary | ICD-10-CM

## 2021-01-20 LAB — GLUCOSE, CAPILLARY
Glucose-Capillary: 87 mg/dL (ref 70–99)
Glucose-Capillary: 123 mg/dL — ABNORMAL HIGH (ref 70–99)

## 2021-01-20 MED ORDER — PREDNISONE 10 MG PO TABS
10.0000 mg | ORAL_TABLET | Freq: Every day | ORAL | Status: DC
  Filled 2021-01-20: qty 1

## 2021-01-20 MED ORDER — LEFLUNOMIDE 20 MG PO TABS: 20.0000 mg | ORAL_TABLET | Freq: Every day | ORAL | 11 refills | Status: DC

## 2021-01-20 MED ORDER — IRBESARTAN 300 MG PO TABS
300.0000 mg | ORAL_TABLET | Freq: Every day | ORAL | Status: DC
  Administered 2021-01-20: 300 mg via ORAL
  Filled 2021-01-20: qty 1

## 2021-01-20 MED ORDER — METRONIDAZOLE 500 MG PO TABS: 500.0000 mg | ORAL_TABLET | Freq: Three times a day (TID) | ORAL | 0 refills | Status: AC

## 2021-01-20 MED ORDER — KETOROLAC TROMETHAMINE 30 MG/ML IJ SOLN
30.0000 mg | Freq: Once | INTRAMUSCULAR | Status: AC
  Administered 2021-01-20: 30 mg via INTRAVENOUS
  Filled 2021-01-20: qty 1

## 2021-01-20 MED ORDER — METOPROLOL SUCCINATE ER 50 MG PO TB24
75.0000 mg | ORAL_TABLET | Freq: Every day | ORAL | Status: DC
  Administered 2021-01-20: 75 mg via ORAL
  Filled 2021-01-20: qty 1

## 2021-01-20 NOTE — Discharge Summary (Signed)
PATIENT DETAILS Name: Chardonay MADILYNN MONTANTE Age: 80 y.o. Sex: female Date of Birth: 1940-12-30 MRN: [id number]. Admitting Physician: Lenore Cordia, MD [id number], Langley Adie, MD  Admit Date: 01/16/2021 Discharge date: 01/20/2021  Recommendations for Outpatient Follow-up:  Follow up with PCP in 1-2 weeks Please obtain CMP/CBC in one week   Admitted From:  Home  Disposition: Home with home health services (not keen on SNF)   Home Health: Yes  Equipment/Devices: None  Discharge Condition: Stable  CODE STATUS: FULL CODE  Diet recommendation:  Diet Order             Diet - low sodium heart healthy           DIET SOFT Room service appropriate? Yes; Fluid consistency: Thin  Diet effective now                    Brief Narrative: Patient is a 80 y.o. female with history of pulmonary fibrosis/bronchiectasis-chronic hypoxic respiratory failure, HFpEF, RA on prednisone, DM-2, HTN, HLD, hypothyroidism, chronic lower extremity lymphedema, chronic pain syndrome-presented to the hospital with diarrhea, altered mental status-she was found to have sepsis physiology due to left-sided colitis.  She recently had completed a course of Augmentin for a cyst/abscess on her right breast.  She was subsequently admitted to the hospitalist service.  See below for further details.   Significant events: 8/31>> confusion/diarrhea/fever/hypotension-sepsis/encephalopathy-due to left-sided colitis.   Significant studies: 8/31>> CT head: No acute intracranial abnormality. 8/31>> CTA chest/abdomen: No acute vascular abnormality-descending colon colitis.   Antimicrobial therapy: Rocephin: 8/31>> 9/4 Flagyl: 9/1>>   Microbiology data: 8/31>> influenza/COVID PCR: Negative 8/31>> blood cultures: Negative   Procedures : None   Consults: None    Brief Hospital Course: Severe sepsis due to left-sided colitis: Sepsis has resolved-significant improvement in colitis symptoms-LLQ pain and  diarrhea has essentially resolved.  Treated with Rocephin/Flagyl-has completed more than 5 days of Rocephin-we will continue with Flagyl for another day or so.  Initially was some concern for C. difficile colitis given recent antibiotic use-however her diarrhea rapidly resolved-stool studies were never sent because of lack of diarrhea.  .  Doubt C. difficile colitis given resolution of diarrhea with antimicrobial therapy and other supportive care.     Acute metabolic encephalopathy: Resolved-likely due to sepsis/dehydration.  CT head was negative.   AKI: Resolved-likely hemodynamically mediated.    Pulmonary fibrosis/chronic bronchiectasis-with chronic hypoxic respiratory failure-on 2 L of oxygen at home: This appears stable-continue bronchodilators-resume maintenance Zithromax that she is on chronically.  Z   HFpEF: Euvolemic on exam--has been resumed on her usual regimen of diuretics.     Rheumatoid arthritis: Resume Plaquenil-initially stress dose steroids was given as she was with sepsis physiology-she has been slowly tapered back to her usual dosing of prednisone.  Leflunomide should be able to be resumed in the next few days as a sepsis physiology/infection has essentially resolved.     HTN: BP on the higher side-her antihypertensive medications were gradually restarted-on day of discharge plan is to resume all of her usual regimen.  Follow with PCP for further optimization.   DM-2  (A1c 5.6 on 5/11): CBG stable with SSI.  Follow with PCP for further needs.  HLD: Continue statin   Hypothyroidism: Continue Synthroid-TSH within normal limits.   Chronic back pain/chronic pain syndrome: Stable on chronic narcotics/Neurontin/Cymbalta.     Debility/deconditioning/functional quadriplegia: Due to acute illness-appreciate evaluation by rehab services-patient not interested in SNF and wants to go home with maximal home  health services.   Morbid Obesity: Estimated body mass index is 43.4 kg/m as  calculated from the following:   Height as of this encounter: _0  (1.626 m).   Weight as of this encounter: 114.7 kg.     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)   Chronic respiratory failure with hypoxia (HCC)   AKI (acute kidney injury) (HCC)   Chronic diastolic CHF (congestive heart failure) (HCC)   Pulmonary fibrosis, postinflammatory (HCC)   Acquired hypothyroidism   Colitis   Acute metabolic encephalopathy   Discharge Instructions:  Activity:  As tolerated  Discharge Instructions     Call MD for:  persistant nausea and vomiting   Complete by: As directed    Call MD for:  redness, tenderness, or signs of infection (pain, swelling, redness, odor or green/yellow discharge around incision site)   Complete by: As directed    Call MD for:  severe uncontrolled pain   Complete by: As directed    Diet - low sodium heart healthy   Complete by: As directed    Discharge instructions   Complete by: As directed    Follow with Primary MD  [name], MD in 1-2 weeks  Please get a complete blood count and chemistry panel checked by your Primary MD at your next visit, and again as instructed by your Primary MD.  Get Medicines reviewed and adjusted: Please take all your medications with you for your next visit with your Primary MD  Laboratory/radiological data: Please request your Primary MD to go over all hospital tests and procedure/radiological results at the follow up, please ask your Primary MD to get all Hospital records sent to his/her office.  In some cases, they will be blood work, cultures and biopsy results pending at the time of your discharge. Please request that your primary care M.D. follows up on these results.  Also Note the following: If you experience worsening of your  admission symptoms, develop shortness of breath, life threatening emergency, suicidal or homicidal thoughts you must seek medical attention immediately by calling 911 or calling your MD immediately  if symptoms less severe.  You must read complete instructions/literature along with all the possible adverse reactions/side effects for all the Medicines you take and that have been prescribed to you. Take any new Medicines after you have completely understood and accpet all the possible adverse reactions/side effects.   Do not drive when taking Pain medications or sleeping medications (Benzodaizepines)  Do not take more than prescribed Pain, Sleep and Anxiety Medications. It is not advisable to combine anxiety,sleep and pain medications without talking with your primary care practitioner  Special Instructions: If you have smoked or chewed Tobacco  in the last 2 yrs please stop smoking, stop any regular Alcohol  and or any Recreational drug use.  Wear Seat belts while driving.  Please note: You were cared for by a hospitalist during your hospital stay. Once you are discharged, your primary care physician will handle any further medical issues. Please note that NO REFILLS for any discharge medications will be authorized once you are discharged, as it is imperative that you return to your primary care physician (or establish a relationship with a primary care physician if you do not have one) for your post hospital discharge needs so that they can reassess  your need for medications and monitor your lab values.   Increase activity slowly   Complete by: As directed       Allergies as of 01/20/2021       Reactions   Codeine Swelling, Other (See Comments)   Facial swelling Chest pain and swelling in legs Facial swelling   Infliximab Anaphylaxis   "sent me into shock" "sent me into shock"   Lisinopril Swelling   Face and neck swelling Face and neck swelling        Medication List     STOP  taking these medications    amoxicillin-clavulanate 500-125 MG tablet Commonly known as: AUGMENTIN       TAKE these medications    acetaminophen 500 MG tablet Commonly known as: TYLENOL Take 1,000 mg by mouth every 6 (six) hours as needed for moderate pain.   AeroChamber MV inhaler Use as instructed   albuterol 108 (90 Base) MCG/ACT inhaler Commonly known as: VENTOLIN HFA Inhale 2 puffs into the lungs every 6 (six) hours as needed for wheezing or shortness of breath.   allopurinol 300 MG tablet Commonly known as: ZYLOPRIM Take 300 mg by mouth daily.   atorvastatin 20 MG tablet Commonly known as: LIPITOR Take 0.5 tablets (10 mg total) by mouth every evening.   azithromycin 250 MG tablet Commonly known as: ZITHROMAX Take 2 tabs on day one.  Then take 1 tab daily on days 2-7. What changed:  how much to take how to take this when to take this additional instructions   Bufferin 325 MG Tabs tablet Generic drug: aspirin buffered Take 325 mg by mouth daily.   CALCIUM + D3 PO Take 2 tablets by mouth daily with lunch.   cholecalciferol 1000 units tablet Commonly known as: VITAMIN D Take 1,000 Units by mouth daily with lunch.   colchicine 0.6 MG tablet Take 0.6 mg by mouth daily as needed (gout flares).   cyclobenzaprine 5 MG tablet Commonly known as: FLEXERIL Take 1 tablet (5 mg total) by mouth at bedtime as needed for muscle spasms.   diclofenac sodium 1 % Gel Commonly known as: VOLTAREN Apply 2 g topically 3 (three) times daily. Both hands and knees   DRY EYES OP Apply 1 drop to eye daily as needed (dry eyes).   DULoxetine 60 MG capsule Commonly known as: CYMBALTA Take 1 capsule (60 mg total) by mouth daily.   fluticasone 50 MCG/ACT nasal spray Commonly known as: FLONASE Place 2 sprays into both nostrils daily.   furosemide 80 MG tablet Commonly known as: LASIX Take 1 tablet (80 mg total) by mouth daily.   gabapentin 600 MG tablet Commonly known as:  NEURONTIN Take two in the morning , Two mid- afternoon and two at bedtime What changed:  how much to take how to take this when to take this additional instructions   guaiFENesin 600 MG 12 hr tablet Commonly known as: MUCINEX Take 1,200 mg by mouth 2 (two) times daily.   hydroxychloroquine 200 MG tablet Commonly known as: PLAQUENIL Take 200 mg by mouth daily.   ipratropium-albuterol 0.5-2.5 (3) MG/3ML Soln Commonly known as: DUONEB Take 3 mLs by nebulization every 6 (six) hours as needed (cough/wheezing).   leflunomide 20 MG tablet Commonly known as: ARAVA Take 1 tablet (20 mg total) by mouth daily. Start taking on: January 23, 2021 What changed: These instructions start on January 23, 2021. If you are unsure what to do until then, ask your doctor or other care provider.  levothyroxine 50 MCG tablet Commonly known as: SYNTHROID Take 1 tablet (50 mcg total) by mouth daily.   linaclotide 72 MCG capsule Commonly known as: Linzess Take 1 capsule (72 mcg total) by mouth daily before breakfast.   metolazone 2.5 MG tablet Commonly known as: ZAROXOLYN TAKE 1 TABLET BY MOUTH DAILY AS NEEDED FOR 2 DAYS FOR WEIGHT ABOVE 255 POUNDS   metoprolol succinate 25 MG 24 hr tablet Commonly known as: Toprol XL Take 3 tablets (75 mg total) by mouth daily.   metroNIDAZOLE 500 MG tablet Commonly known as: Flagyl Take 1 tablet (500 mg total) by mouth 3 (three) times daily for 2 days.   morphine 15 MG 12 hr tablet Commonly known as: MS CONTIN Take 1 tablet (15 mg total) by mouth every 12 (twelve) hours. What changed: Another medication with the same name was changed. Make sure you understand how and when to take each.   morphine 15 MG tablet Commonly known as: MSIR One tablet twice a day as needed for break through pain. What changed:  how much to take how to take this when to take this reasons to take this   multivitamin with minerals Tabs tablet Take 1 tablet by mouth daily.    olmesartan 40 MG tablet Commonly known as: BENICAR Take 1 tablet (40 mg total) by mouth daily.   OXYGEN Inhale 2 L into the lungs at bedtime. And on exertion   Ozempic (0.25 or 0.5 MG/DOSE) 2 MG/1.5ML Sopn Generic drug: Semaglutide(0.25 or 0.5MG/DOS) Inject 0.5 mg into the skin once a week. What changed: when to take this   pantoprazole 40 MG tablet Commonly known as: PROTONIX take 1 tablet by mouth once daily MINUTES BEFORE 1ST MEAL OF THE DAY What changed:  how much to take how to take this when to take this additional instructions   potassium chloride SA 20 MEQ tablet Commonly known as: KLOR-CON TAKE 1 TABLET(20 MEQ) BY MOUTH DAILY What changed: See the new instructions.   predniSONE 5 MG tablet Commonly known as: DELTASONE Take 10 mg by mouth every morning. What changed: Another medication with the same name was removed. Continue taking this medication, and follow the directions you see here.   sodium chloride 0.65 % Soln nasal spray Commonly known as: OCEAN Place 1 spray into both nostrils as needed for congestion.        Follow-up Information     [facility] Follow up.   Why: the office will call to schedule home health visits Contact information: [place] [address] [phone]         [name], MD. Schedule an appointment as soon as possible for a visit in 1 week(s).   Specialty: Family Medicine Contact information: [place] [address] [phone]         Lorretta Harp, MD. Schedule an appointment as soon as possible for a visit in 1 month(s).   Specialties: Cardiology, Radiology Contact information: [address] [place] [address] [phone]                Allergies  Allergen Reactions   Codeine Swelling and Other (See Comments)    Facial swelling Chest pain and swelling in legs  Facial swelling   Infliximab Anaphylaxis    "sent  me into shock" "sent me into shock"   Lisinopril Swelling    Face and neck swelling Face and neck swelling  Consultations:  None    Other Procedures/Studies: CT Head Wo Contrast  Result Date: 01/16/2021 CLINICAL DATA:  Mental status change. Unknown cause. has been unresponsive and out of oxygen for an hour. Pt responsive to pain EXAM: CT HEAD WITHOUT CONTRAST TECHNIQUE: Contiguous axial images were obtained from the base of the skull through the vertex without intravenous contrast. COMPARISON:  MRI head 02/28/2014 BRAIN: BRAIN Patchy and confluent areas of decreased attenuation are noted throughout the deep and periventricular white matter of the cerebral hemispheres bilaterally, compatible with chronic microvascular ischemic disease. No evidence of large-territorial acute infarction. No parenchymal hemorrhage. No mass lesion. No extra-axial collection. No mass effect or midline shift. No hydrocephalus. Basilar cisterns are patent. Vascular: No hyperdense vessel. Skull: No acute fracture or focal lesion. Sinuses/Orbits: Paranasal sinuses and mastoid air cells are clear. The orbits are unremarkable. Other: None. IMPRESSION: No acute intracranial abnormality. Electronically Signed   By: Iven Finn M.D.   On: 01/16/2021 18:18   DG Chest Portable 1 View  Result Date: 01/16/2021 CLINICAL DATA:  Unresponsive EXAM: PORTABLE CHEST 1 VIEW COMPARISON:  Chest radiograph 11/22/2020 FINDINGS: The heart is mildly enlarged. The mediastinum is prominent, increased from prior. Lung volumes are low. There are patchy opacities in the lung bases, right more than left. There is no significant pleural effusion. There is no pneumothorax. There is no acute osseous abnormality. IMPRESSION: 1. Widening of the mediastinum, likely exaggerated by low lung volumes and AP technique, though aortic aneurysm or dissection are possibility. Recommend CTA chest for further evaluation. 2. Patchy opacities in the lung bases  may reflect atelectasis or infection. These results were called by telephone at the time of interpretation on 01/16/2021 at 4:47 pm to provider [facility] , who verbally acknowledged these results. Electronically Signed   By: Valetta Mole M.D.   On: 01/16/2021 16:48   CT Angio Chest/Abd/Pel for Dissection W and/or Wo Contrast  Result Date: 01/16/2021 CLINICAL DATA:  Thoracic aortic aneurysm (TAA) suspected widened mediastinum on CXR. Unresponsive. EXAM: CT ANGIOGRAPHY CHEST, ABDOMEN AND PELVIS TECHNIQUE: Non-contrast CT of the chest was initially obtained. Multidetector CT imaging through the chest, abdomen and pelvis was performed using the standard protocol during bolus administration of intravenous contrast. Multiplanar reconstructed images and MIPs were obtained and reviewed to evaluate the vascular anatomy. CONTRAST:  54m OMNIPAQUE IOHEXOL 350 MG/ML SOLN COMPARISON:  Chest x-ray 01/16/2021, chest x-ray 11/22/2020, CT chest 04/21/2019 FINDINGS: CTA CHEST FINDINGS Cardiovascular: Preferential opacification of the thoracic aorta. No evidence of thoracic aortic aneurysm or dissection. Mild atherosclerotic plaque of the thoracic aorta. At least 2 vessel coronary artery calcifications. Normal heart size. No significant pericardial effusion. The main pulmonary artery is normal in caliber. No central pulmonary embolus. Mediastinum/Nodes: Prominent but nonenlarged mediastinal lymph nodes. No enlarged mediastinal, hilar, or axillary lymph nodes. Thyroid gland, trachea, and esophagus demonstrate no significant findings. Lungs/Pleura: Expiratory phase of respiration with mosaic attenuation of the lungs. Diffuse peribronchovascular ground-glass airspace opacity. Similar-appearing underlying trace peripheral reticulations. No focal consolidation. No pulmonary nodule. No pulmonary mass. No pleural effusion. No pneumothorax. Musculoskeletal: No chest wall abnormality. No suspicious lytic or blastic osseous lesions. No  acute displaced fracture. Multilevel degenerative changes of the spine. Review of the MIP images confirms the above findings. CTA ABDOMEN AND PELVIS FINDINGS VASCULAR Aorta: Mild atherosclerotic plaque. Normal caliber aorta without aneurysm, dissection, vasculitis or significant stenosis. Celiac: Patent without evidence of aneurysm, dissection, vasculitis or significant stenosis. SMA: Mild atherosclerotic plaque. Patent without evidence of aneurysm, dissection,  vasculitis or significant stenosis. Renals: Mild atherosclerotic plaque. Both renal arteries are patent without evidence of aneurysm, dissection, vasculitis, fibromuscular dysplasia or significant stenosis. IMA: Patent without evidence of aneurysm, dissection, vasculitis or significant stenosis. Inflow: Mild atherosclerotic plaque. Patent without evidence of aneurysm, dissection, vasculitis or significant stenosis. Veins: No obvious venous abnormality within the limitations of this arterial phase study. Review of the MIP images confirms the above findings. NON-VASCULAR Hepatobiliary: No focal liver abnormality. No gallstones, gallbladder wall thickening, or pericholecystic fluid. No biliary dilatation. Pancreas: No focal lesion. Normal pancreatic contour. No surrounding inflammatory changes. No main pancreatic ductal dilatation. Spleen: Normal in size without focal abnormality. Adrenals/Urinary Tract: No adrenal nodule bilaterally. Nonspecific trace bilateral perinephric stranding. Bilateral kidneys enhance symmetrically. Parapelvic cyst versus prominent renal pelvis on the right. Fluid density lesions within the kidneys likely represent simple renal cysts measuring up to 6 cm on the right. Couple of punctate calcifications noted within the right kidney. No hydronephrosis. No hydroureter. The urinary bladder is unremarkable. Stomach/Bowel: Stomach is within normal limits. No evidence of small bowel wall thickening or dilatation. No large bowel dilatation.  Circumferential bowel wall thickening of the descending colon. Trace pericolonic fat stranding. Appendix appears normal. Lymphatic: No lymphadenopathy. Reproductive: Status post hysterectomy. No adnexal masses. Other: No intraperitoneal free fluid. No intraperitoneal free gas. No organized fluid collection. Musculoskeletal: No abdominal wall hernia or abnormality. No suspicious lytic or blastic osseous lesions. No acute displaced fracture. Multilevel severe degenerative changes of the spine. Partially visualized proximal left femoral fracture status post surgical hardware fixation. Review of the MIP images confirms the above findings. IMPRESSION: 1. No acute vascular abnormality. Findings noted on chest x-ray likely due to patient rotation and positioning. 2. Diffuse peribronchovascular ground-glass airspace opacities may represent pulmonary edema versus infection/inflammation (such as aspiration pneumonia). 3. Descending colon colitis. Differential diagnosis for etiology includes infection, inflammation, ischemia. No findings of associated bowel perforation or obstruction. Recommend colonoscopy status post treatment and status post complete resolution of inflammatory changes to exclude an underlying lesion. 4. Other imaging findings of potential clinical significance: Nonobstructive punctate right nephrolithiasis. Simple renal cysts within the kidneys. Electronically Signed   By: Iven Finn M.D.   On: 01/16/2021 18:16     TODAY-DAY OF DISCHARGE:  Subjective:   Zakia Apachito today has no headache,no chest abdominal pain,no new weakness tingling or numbness, feels much better wants to go home today.   Objective:   Blood pressure (!) 169/95, pulse 94, temperature 98.5 F (36.9 C), temperature source Oral, resp. rate 18, height 5' 4" (1.626 m), weight 113.4 kg, SpO2 99 %.  Intake/Output Summary (Last 24 hours) at 01/20/2021 0959 Last data filed at 01/20/2021 0409 Gross per 24 hour  Intake 671.73 ml   Output 2000 ml  Net -1328.27 ml   Filed Weights   01/18/21 0500 01/19/21 0407 01/20/21 0356  Weight: 115.8 kg 114.7 kg 113.4 kg    Exam: Awake Alert, Oriented *3, No new F.N deficits, Normal affect Jansen.AT,PERRAL Supple Neck,No JVD, No cervical lymphadenopathy appriciated.  Symmetrical Chest wall movement, Good air movement bilaterally, CTAB RRR,No Gallops,Rubs or new Murmurs, No Parasternal Heave +ve B.Sounds, Abd Soft, Non tender, No organomegaly appriciated, No rebound -guarding or rigidity. No Cyanosis, Clubbing or edema, No new Rash or bruise   PERTINENT RADIOLOGIC STUDIES: No results found.   PERTINENT LAB RESULTS: CBC: Recent Labs    01/18/21 0324 01/19/21 0211  WBC 13.1* 9.0  HGB 11.4* 10.4*  HCT 36.2 33.5*  PLT 125* 125*  CMET CMP     Component Value Date/Time   NA 141 01/19/2021 0211   NA 149 (A) 08/01/2020 0000   NA 139 02/17/2012 1337   K 3.6 01/19/2021 0211   K 4.8 02/17/2012 1337   CL 101 01/19/2021 0211   CL 103 02/17/2012 1337   CO2 32 01/19/2021 0211   CO2 25 02/17/2012 1337   GLUCOSE 93 01/19/2021 0211   GLUCOSE 112 (H) 02/17/2012 1337   BUN 11 01/19/2021 0211   BUN 23 (A) 08/01/2020 0000   BUN 32.0 (H) 02/17/2012 1337   CREATININE 0.79 01/19/2021 0211   CREATININE 1.57 (H) 09/17/2016 1438   CREATININE 1.7 (H) 02/17/2012 1337   CALCIUM 7.9 (L) 01/19/2021 0211   CALCIUM 8.9 02/17/2012 1337   PROT 4.7 (L) 01/19/2021 0211   PROT 6.1 05/26/2018 1259   PROT 6.5 02/17/2012 1337   ALBUMIN 2.3 (L) 01/19/2021 0211   ALBUMIN 4.3 05/26/2018 1259   ALBUMIN 3.3 (L) 02/17/2012 1337   AST 14 (L) 01/19/2021 0211   AST 16 02/17/2012 1337   ALT 23 01/19/2021 0211   ALT 26 02/17/2012 1337   ALKPHOS 60 01/19/2021 0211   ALKPHOS 59 02/17/2012 1337   BILITOT 0.6 01/19/2021 0211   BILITOT 0.7 05/26/2018 1259   BILITOT 0.70 02/17/2012 1337   GFRNONAA >60 01/19/2021 0211   GFRNONAA 32 (L) 09/17/2016 1438   GFRAA 49 (L) 11/10/2019 2052   GFRAA 37 (L)  09/17/2016 1438    GFR Estimated Creatinine Clearance: 70.4 mL/min (by C-G formula based on SCr of 0.79 mg/dL). No results for input(s): LIPASE, AMYLASE in the last 72 hours. No results for input(s): CKTOTAL, CKMB, CKMBINDEX, TROPONINI in the last 72 hours. Invalid input(s): POCBNP No results for input(s): DDIMER in the last 72 hours. No results for input(s): HGBA1C in the last 72 hours. No results for input(s): CHOL, HDL, LDLCALC, TRIG, CHOLHDL, LDLDIRECT in the last 72 hours. No results for input(s): TSH, T4TOTAL, T3FREE, THYROIDAB in the last 72 hours.  Invalid input(s): FREET3 No results for input(s): VITAMINB12, FOLATE, FERRITIN, TIBC, IRON, RETICCTPCT in the last 72 hours. Coags: No results for input(s): INR in the last 72 hours.  Invalid input(s): PT Microbiology: Recent Results (from the past 240 hour(s))  Resp Panel by RT-PCR (Flu A&B, Covid) Nasopharyngeal Swab     Status: None   Collection Time: 01/16/21  3:36 PM   Specimen: Nasopharyngeal Swab; Nasopharyngeal(NP) swabs in vial transport medium  Result Value Ref Range Status   SARS Coronavirus 2 by RT PCR NEGATIVE NEGATIVE Final    Comment: (NOTE) SARS-CoV-2 target nucleic acids are NOT DETECTED.  The SARS-CoV-2 RNA is generally detectable in upper respiratory specimens during the acute phase of infection. The lowest concentration of SARS-CoV-2 viral copies this assay can detect is 138 copies/mL. A negative result does not preclude SARS-Cov-2 infection and should not be used as the sole basis for treatment or other patient management decisions. A negative result may occur with  improper specimen collection/handling, submission of specimen other than nasopharyngeal swab, presence of viral mutation(s) within the areas targeted by this assay, and inadequate number of viral copies(<138 copies/mL). A negative result must be combined with clinical observations, patient history, and epidemiological information. The  expected result is Negative.  Fact Sheet for Patients:  EntrepreneurPulse.com.au  Fact Sheet for Healthcare Providers:  IncredibleEmployment.be  This test is no t yet approved or cleared by the [place] FDA and  has been authorized for detection and/or diagnosis  of SARS-CoV-2 by FDA under an Emergency Use Authorization (EUA). This EUA will remain  in effect (meaning this test can be used) for the duration of the COVID-19 declaration under Section 564(b)(1) of the Act, 21 U.S.C.section 360bbb-3(b)(1), unless the authorization is terminated  or revoked sooner.       Influenza A by PCR NEGATIVE NEGATIVE Final   Influenza B by PCR NEGATIVE NEGATIVE Final    Comment: (NOTE) The Xpert Xpress SARS-CoV-2/FLU/RSV plus assay is intended as an aid in the diagnosis of influenza from Nasopharyngeal swab specimens and should not be used as a sole basis for treatment. Nasal washings and aspirates are unacceptable for Xpert Xpress SARS-CoV-2/FLU/RSV testing.  Fact Sheet for Patients: EntrepreneurPulse.com.au  Fact Sheet for Healthcare Providers: IncredibleEmployment.be  This test is not yet approved or cleared by the [place] FDA and has been authorized for detection and/or diagnosis of SARS-CoV-2 by FDA under an Emergency Use Authorization (EUA). This EUA will remain in effect (meaning this test can be used) for the duration of the COVID-19 declaration under Section 564(b)(1) of the Act, 21 U.S.C. section 360bbb-3(b)(1), unless the authorization is terminated or revoked.  Performed at [facility], [place] [address], [place], [place] [address]   Culture, blood (routine x 2)     Status: None (Preliminary result)   Collection Time: 01/16/21  3:50 PM   Specimen: BLOOD  Result Value Ref Range Status   Specimen Description BLOOD LEFT ANTECUBITAL  Final   Special Requests   Final    BOTTLES DRAWN AEROBIC  AND ANAEROBIC Blood Culture adequate volume   Culture   Final    NO GROWTH 4 DAYS Performed at [facility], [place] [address], [place], [place] [address]    Report Status PENDING  Incomplete  Culture, blood (routine x 2)     Status: None (Preliminary result)   Collection Time: 01/16/21  3:55 PM   Specimen: BLOOD  Result Value Ref Range Status   Specimen Description BLOOD RIGHT ANTECUBITAL  Final   Special Requests   Final    BOTTLES DRAWN AEROBIC AND ANAEROBIC Blood Culture adequate volume   Culture   Final    NO GROWTH 4 DAYS Performed at [facility], [place] [address] [place], [place] [address]    Report Status PENDING  Incomplete    FURTHER DISCHARGE INSTRUCTIONS:  Get Medicines reviewed and adjusted: Please take all your medications with you for your next visit with your Primary MD  Laboratory/radiological data: Please request your Primary MD to go over all hospital tests and procedure/radiological results at the follow up, please ask your Primary MD to get all Hospital records sent to his/her office.  In some cases, they will be blood work, cultures and biopsy results pending at the time of your discharge. Please request that your primary care M.D. goes through all the records of your hospital data and follows up on these results.  Also Note the following: If you experience worsening of your admission symptoms, develop shortness of breath, life threatening emergency, suicidal or homicidal thoughts you must seek medical attention immediately by calling 911 or calling your MD immediately  if symptoms less severe.  You must read complete instructions/literature along with all the possible adverse reactions/side effects for all the Medicines you take and that have been prescribed to you. Take any new Medicines after you have completely understood and accpet all the possible adverse reactions/side effects.   Do not drive when taking  Pain medications or sleeping medications  (Benzodaizepines)  Do not take more than prescribed Pain, Sleep and Anxiety Medications. It is not advisable to combine anxiety,sleep and pain medications without talking with your primary care practitioner  Special Instructions: If you have smoked or chewed Tobacco  in the last 2 yrs please stop smoking, stop any regular Alcohol  and or any Recreational drug use.  Wear Seat belts while driving.  Please note: You were cared for by a hospitalist during your hospital stay. Once you are discharged, your primary care physician will handle any further medical issues. Please note that NO REFILLS for any discharge medications will be authorized once you are discharged, as it is imperative that you return to your primary care physician (or establish a relationship with a primary care physician if you do not have one) for your post hospital discharge needs so that they can reassess your need for medications and monitor your lab values.  Total Time spent coordinating discharge including counseling, education and face to face time equals 35 minutes.  SignedOren Binet 01/20/2021 9:59 AM

## 2021-01-20 NOTE — Progress Notes (Signed)
Discharge planning reviewed with Malynda F. [name]. And Pt daughter. It is stated that discharge paperwork is understood.

## 2021-01-20 NOTE — Plan of Care (Signed)
  Problem: Education: Goal: Knowledge of General Education information will improve Description: Including pain rating scale, medication(s)/side effects and non-pharmacologic comfort measures Outcome: Progressing   Problem: Health Behavior/Discharge Planning: Goal: Ability to manage health-related needs will improve Outcome: Progressing   Problem: Activity: Goal: Risk for activity intolerance will decrease Outcome: Progressing   Problem: Nutrition: Goal: Adequate nutrition will be maintained Outcome: Progressing   Problem: Coping: Goal: Level of anxiety will decrease Outcome: Progressing

## 2021-01-21 LAB — CULTURE, BLOOD (ROUTINE X 2)
Culture: NO GROWTH
Culture: NO GROWTH
Special Requests: ADEQUATE
Special Requests: ADEQUATE

## 2021-01-22 ENCOUNTER — Telehealth: Payer: Self-pay

## 2021-01-22 NOTE — Telephone Encounter (Signed)
Transition Care Management Unsuccessful Follow-up Telephone Call  Date of discharge and from where:  [name]  Attempts:  1st Attempt  Reason for unsuccessful TCM follow-up call:  Unable to reach patient

## 2021-01-23 DIAGNOSIS — N2581 Secondary hyperparathyroidism of renal origin: Secondary | ICD-10-CM | POA: Diagnosis not present

## 2021-01-23 DIAGNOSIS — E1122 Type 2 diabetes mellitus with diabetic chronic kidney disease: Secondary | ICD-10-CM | POA: Diagnosis not present

## 2021-01-23 DIAGNOSIS — I129 Hypertensive chronic kidney disease with stage 1 through stage 4 chronic kidney disease, or unspecified chronic kidney disease: Secondary | ICD-10-CM | POA: Diagnosis not present

## 2021-01-23 DIAGNOSIS — N183 Chronic kidney disease, stage 3 unspecified: Secondary | ICD-10-CM | POA: Diagnosis not present

## 2021-01-23 NOTE — Telephone Encounter (Signed)
Transition Care Management Unsuccessful Follow-up Telephone Call  Date of discharge and from where:  [facility] 01/20/2021  Attempts:  2nd Attempt  Reason for unsuccessful TCM follow-up call:  Unable to reach patient    

## 2021-01-24 ENCOUNTER — Encounter: Payer: Self-pay | Admitting: Family Medicine

## 2021-01-24 ENCOUNTER — Ambulatory Visit (INDEPENDENT_AMBULATORY_CARE_PROVIDER_SITE_OTHER): Payer: Medicare Other | Admitting: Family Medicine

## 2021-01-24 ENCOUNTER — Other Ambulatory Visit: Payer: Self-pay

## 2021-01-24 VITALS — BP 140/100 | HR 93 | Temp 98.5°F

## 2021-01-24 DIAGNOSIS — A419 Sepsis, unspecified organism: Secondary | ICD-10-CM | POA: Diagnosis not present

## 2021-01-24 DIAGNOSIS — I10 Essential (primary) hypertension: Secondary | ICD-10-CM | POA: Diagnosis not present

## 2021-01-24 DIAGNOSIS — K5150 Left sided colitis without complications: Secondary | ICD-10-CM | POA: Diagnosis not present

## 2021-01-24 DIAGNOSIS — I5032 Chronic diastolic (congestive) heart failure: Secondary | ICD-10-CM

## 2021-01-24 DIAGNOSIS — E1140 Type 2 diabetes mellitus with diabetic neuropathy, unspecified: Secondary | ICD-10-CM | POA: Diagnosis not present

## 2021-01-24 DIAGNOSIS — J8410 Pulmonary fibrosis, unspecified: Secondary | ICD-10-CM | POA: Diagnosis not present

## 2021-01-24 LAB — COMPREHENSIVE METABOLIC PANEL
ALT: 41 U/L — ABNORMAL HIGH (ref 0–35)
AST: 23 U/L (ref 0–37)
Albumin: 3.4 g/dL — ABNORMAL LOW (ref 3.5–5.2)
Alkaline Phosphatase: 72 U/L (ref 39–117)
BUN: 11 mg/dL (ref 6–23)
CO2: 31 mEq/L (ref 19–32)
Calcium: 8.5 mg/dL (ref 8.4–10.5)
Chloride: 101 mEq/L (ref 96–112)
Creatinine, Ser: 0.78 mg/dL (ref 0.40–1.20)
GFR: 72.37 mL/min (ref >60.00)
Glucose, Bld: 185 mg/dL — ABNORMAL HIGH (ref 70–99)
Potassium: 3.7 mEq/L (ref 3.5–5.1)
Sodium: 141 mEq/L (ref 135–145)
Total Bilirubin: 0.4 mg/dL (ref 0.2–1.2)
Total Protein: 5.8 g/dL — ABNORMAL LOW (ref 6.0–8.3)

## 2021-01-24 LAB — CBC WITH DIFFERENTIAL/PLATELET
Basophils Absolute: 0 10*3/uL (ref 0.0–0.1)
Basophils Relative: 0.5 % (ref 0.0–3.0)
Eosinophils Absolute: 0.1 10*3/uL (ref 0.0–0.7)
Eosinophils Relative: 2 % (ref 0.0–5.0)
HCT: 37.7 % (ref 36.0–46.0)
Hemoglobin: 11.8 g/dL — ABNORMAL LOW (ref 12.0–15.0)
Lymphocytes Relative: 15.3 % (ref 12.0–46.0)
Lymphs Abs: 1.1 10*3/uL (ref 0.7–4.0)
MCHC: 31.3 g/dL (ref 30.0–36.0)
MCV: 95.3 fl (ref 78.0–100.0)
Monocytes Absolute: 0.4 10*3/uL (ref 0.1–1.0)
Monocytes Relative: 5.6 % (ref 3.0–12.0)
Neutro Abs: 5.3 10*3/uL (ref 1.4–7.7)
Neutrophils Relative %: 76.6 % (ref 43.0–77.0)
Platelets: 197 10*3/uL (ref 150.0–400.0)
RBC: 3.96 Mil/uL (ref 3.87–5.11)
RDW: 15.1 % (ref 11.5–15.5)
WBC: 6.9 10*3/uL (ref 4.0–10.5)

## 2021-01-24 NOTE — Progress Notes (Signed)
Subjective:    Patient ID: Kyleeann CELENIA HRUSKA, female    DOB: 01/20/1941, 80 y.o.   MRN: [id number]  Chief Complaint  Patient presents with   [facility] f/u- no diarrhea but still has stomach bubling  Pt accompanied by her daughter  HPI Patient is a 80 year old female with pmh sig for pulmonary fibrosis/bronchiectasis with chronic hypoxic respiratory failure, HTN, HLD, DM2, HFpEF, RA on prednisone, hypothyroidism, lymphedema, chronic pain who was seen today for HFU.  Pt admitted 8/31-01/20/21 for severe sepsis 2/2 colitis.  Pt completed course of Rocephin and Flagyl.  Since discharge patient states she is feeling better.  Endorses stomach making rumbling/loud noises.  Denies SOB, abdominal pain, nausea, vomiting, or loose stools.  Pt has yet to take any lasix this am as she is out for this appointment.  Pt plans to complete advanced directive papers with hospice later this wk.  While hospitalized pt expressed desire to be a partial code as she did not want to be hooked up to any machines.  Past Medical History:  Diagnosis Date   Acute on chronic diastolic (congestive) heart failure (HCC)    Anemia    iron deficiency anemia - secondary to blood loss ( chronic)    Arthritis    endstage changes bilateral knees/bilateral ankles.    Asthma    Carotid artery occlusion    Chronic fatigue    Chronic kidney disease    Closed left hip fracture (HCC)    Clotting disorder ([place])    pt denies this   Contusion of left knee    due to fall 1/14.   COPD (chronic obstructive pulmonary disease) (HCC)    pulmonary fibrosis   Depression, reactive    Diabetes mellitus    type II    Diastolic dysfunction    Difficulty in walking    Family history of heart disease    Generalized muscle weakness    Gout    High cholesterol    History of falling    Hypertension    Hypothyroidism    Interstitial lung disease ([place])    Meningioma of left sphenoid wing involving cavernous sinus ([place]) 02/17/2012    Continue diplopia, left eye pain and left headaches.     Morbid obesity ([place])    Morbid obesity ([place])    Neuromuscular disorder ([place])    diabetic neuropathy    Normal coronary arteries    cardiac catheterization performed  10/31/14   RA (rheumatoid arthritis) ([place])    has been off methotreaxte since 10/13.   Rheumatoid arthritis (HCC)    Shortness of breath    Spinal stenosis of lumbar region    Thyroid disease    Unspecified lack of coordination    URI (upper respiratory infection)     Allergies  Allergen Reactions   Codeine Swelling and Other (See Comments)    Facial swelling Chest pain and swelling in legs  Facial swelling   Infliximab Anaphylaxis    "sent me into shock" "sent me into shock"   Lisinopril Swelling    Face and neck swelling Face and neck swelling    ROS General: Denies fever, chills, night sweats, changes in weight, changes in appetite HEENT: Denies headaches, ear pain, changes in vision, rhinorrhea, sore throat CV: Denies CP, palpitations, SOB, orthopnea +LE edema Pulm: Denies SOB, cough, wheezing GI: Denies abdominal pain, nausea, vomiting, diarrhea, constipation +increased abdominal sounds. GU: Denies dysuria, hematuria, frequency, vaginal discharge Msk: Denies muscle cramps, joint pains  Neuro: Denies weakness, numbness, tingling Skin: Denies rashes, bruising Psych: Denies depression, anxiety, hallucinations    Objective:    Blood pressure (!) 140/100, pulse 93, temperature 98.5 F (36.9 C), temperature source Oral, SpO2 91 %.  Gen. Pleasant, well-nourished, in no distress, normal affect   HEENT: [place]/AT, face symmetric, conjunctiva clear, no scleral icterus, PERRLA, EOMI, nares patent without drainage Lungs: no accessory muscle use, CTAB, no wheezes or rales Cardiovascular: RRR, no m/r/g, 1+ edema in b/l LEs R>L Abdomen: hyperactive BS, soft, NT/ND Musculoskeletal: No deformities, no cyanosis or clubbing, normal tone Neuro:  A&Ox3, CN  II-XII intact, sitting in wheelchair. Skin:  Warm, no lesions/ rash  Wt Readings from Last 3 Encounters:  01/20/21 250 lb (113.4 kg)  11/26/20 236 lb 6.4 oz (107.2 kg)  11/22/20 236 lb 6.4 oz (107.2 kg)    Lab Results  Component Value Date   WBC 9.0 01/19/2021   HGB 10.4 (L) 01/19/2021   HCT 33.5 (L) 01/19/2021   PLT 125 (L) 01/19/2021   GLUCOSE 93 01/19/2021   CHOL 136 09/26/2020   TRIG 283.0 (H) 09/26/2020   HDL 40.00 09/26/2020   LDLDIRECT 59.0 09/26/2020   LDLCALC 75 09/22/2019   ALT 23 01/19/2021   AST 14 (L) 01/19/2021   NA 141 01/19/2021   K 3.6 01/19/2021   CL 101 01/19/2021   CREATININE 0.79 01/19/2021   BUN 11 01/19/2021   CO2 32 01/19/2021   TSH 4.598 (H) 01/16/2021   INR 1.0 01/16/2021   HGBA1C 5.6 09/26/2020    Assessment/Plan:  Sepsis without acute organ dysfunction, due to unspecified organism ([place])  -2/2 left-sided colitis -Improving -Status post 5+ d course of Rocephin and Flagyl -Blood cultures without growth x5 days - Plan: CBC with Differential/Platelet, Comprehensive metabolic panel  Essential hypertension -Uncontrolled -Patient advised to take Lasix when returning home -Patient to monitor BP at home.  Notify clinic for continued elevation greater than 140/90 -Continue lifestyle modifications -Continue current medications including Lasix 80 mg daily, Toprol-XL 75 mg daily, olmesartan 40 mg daily  Left sided colitis without complications (HCC)  -improving -s/p Rocephin and Flagyl - Plan: Ambulatory referral to Gastroenterology  Chronic diastolic heart failure ([place]) -Mild edema in bilateral LEs -Patient encouraged to take Lasix and returning home -Continue to monitor daily weights -Continue current medications including Toprol-XL 75 mg daily, olmesartan 40 mg daily, Lasix 80 mg daily -Continue follow-up with cardiology  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

## 2021-01-25 DIAGNOSIS — Z792 Long term (current) use of antibiotics: Secondary | ICD-10-CM | POA: Diagnosis not present

## 2021-01-25 DIAGNOSIS — M069 Rheumatoid arthritis, unspecified: Secondary | ICD-10-CM | POA: Diagnosis not present

## 2021-01-25 DIAGNOSIS — E1122 Type 2 diabetes mellitus with diabetic chronic kidney disease: Secondary | ICD-10-CM | POA: Diagnosis not present

## 2021-01-25 DIAGNOSIS — L720 Epidermal cyst: Secondary | ICD-10-CM | POA: Diagnosis not present

## 2021-01-25 DIAGNOSIS — F32A Depression, unspecified: Secondary | ICD-10-CM | POA: Diagnosis not present

## 2021-01-25 DIAGNOSIS — K51518 Left sided colitis with other complication: Secondary | ICD-10-CM | POA: Diagnosis not present

## 2021-01-25 DIAGNOSIS — R532 Functional quadriplegia: Secondary | ICD-10-CM | POA: Diagnosis not present

## 2021-01-25 DIAGNOSIS — Z6841 Body Mass Index (BMI) 40.0 and over, adult: Secondary | ICD-10-CM | POA: Diagnosis not present

## 2021-01-25 DIAGNOSIS — J9611 Chronic respiratory failure with hypoxia: Secondary | ICD-10-CM | POA: Diagnosis not present

## 2021-01-25 DIAGNOSIS — Z9981 Dependence on supplemental oxygen: Secondary | ICD-10-CM | POA: Diagnosis not present

## 2021-01-25 DIAGNOSIS — M159 Polyosteoarthritis, unspecified: Secondary | ICD-10-CM | POA: Diagnosis not present

## 2021-01-25 DIAGNOSIS — J8410 Pulmonary fibrosis, unspecified: Secondary | ICD-10-CM | POA: Diagnosis not present

## 2021-01-25 DIAGNOSIS — M48061 Spinal stenosis, lumbar region without neurogenic claudication: Secondary | ICD-10-CM | POA: Diagnosis not present

## 2021-01-25 DIAGNOSIS — G894 Chronic pain syndrome: Secondary | ICD-10-CM | POA: Diagnosis not present

## 2021-01-25 DIAGNOSIS — I890 Lymphedema, not elsewhere classified: Secondary | ICD-10-CM | POA: Diagnosis not present

## 2021-01-25 DIAGNOSIS — I130 Hypertensive heart and chronic kidney disease with heart failure and stage 1 through stage 4 chronic kidney disease, or unspecified chronic kidney disease: Secondary | ICD-10-CM | POA: Diagnosis not present

## 2021-01-25 DIAGNOSIS — E039 Hypothyroidism, unspecified: Secondary | ICD-10-CM | POA: Diagnosis not present

## 2021-01-25 DIAGNOSIS — N189 Chronic kidney disease, unspecified: Secondary | ICD-10-CM | POA: Diagnosis not present

## 2021-01-25 DIAGNOSIS — E1142 Type 2 diabetes mellitus with diabetic polyneuropathy: Secondary | ICD-10-CM | POA: Diagnosis not present

## 2021-01-25 DIAGNOSIS — I5032 Chronic diastolic (congestive) heart failure: Secondary | ICD-10-CM | POA: Diagnosis not present

## 2021-01-25 DIAGNOSIS — E7800 Pure hypercholesterolemia, unspecified: Secondary | ICD-10-CM | POA: Diagnosis not present

## 2021-01-25 DIAGNOSIS — D62 Acute posthemorrhagic anemia: Secondary | ICD-10-CM | POA: Diagnosis not present

## 2021-01-25 DIAGNOSIS — J479 Bronchiectasis, uncomplicated: Secondary | ICD-10-CM | POA: Diagnosis not present

## 2021-01-25 DIAGNOSIS — M549 Dorsalgia, unspecified: Secondary | ICD-10-CM | POA: Diagnosis not present

## 2021-01-25 NOTE — Telephone Encounter (Signed)
Transition Care Management Unsuccessful Follow-up Telephone Call  Date of discharge and from where[facility]  [name]  Attempts:  2nd Attempt  Reason for unsuccessful TCM follow-up call:  Unable to reach patient

## 2021-01-28 ENCOUNTER — Telehealth: Payer: Self-pay | Admitting: Family Medicine

## 2021-01-28 DIAGNOSIS — K51518 Left sided colitis with other complication: Secondary | ICD-10-CM | POA: Diagnosis not present

## 2021-01-28 DIAGNOSIS — E1122 Type 2 diabetes mellitus with diabetic chronic kidney disease: Secondary | ICD-10-CM | POA: Diagnosis not present

## 2021-01-28 DIAGNOSIS — R532 Functional quadriplegia: Secondary | ICD-10-CM | POA: Diagnosis not present

## 2021-01-28 DIAGNOSIS — L720 Epidermal cyst: Secondary | ICD-10-CM | POA: Diagnosis not present

## 2021-01-28 DIAGNOSIS — I5032 Chronic diastolic (congestive) heart failure: Secondary | ICD-10-CM | POA: Diagnosis not present

## 2021-01-28 DIAGNOSIS — I130 Hypertensive heart and chronic kidney disease with heart failure and stage 1 through stage 4 chronic kidney disease, or unspecified chronic kidney disease: Secondary | ICD-10-CM | POA: Diagnosis not present

## 2021-01-28 NOTE — Telephone Encounter (Signed)
Spoke with [name], informed her Dr Volanda Napoleon has given verbal orders for the PT.

## 2021-01-28 NOTE — Telephone Encounter (Signed)
Dwyane Luo called to advise that they have done the [place] for the PT and would like to request orders for:  PT twice a week for 2 weeks PT once a week for 4 weeks  She can be reached at [phone] for further questions and clarification.

## 2021-01-29 ENCOUNTER — Other Ambulatory Visit: Payer: Self-pay

## 2021-01-29 DIAGNOSIS — K51518 Left sided colitis with other complication: Secondary | ICD-10-CM | POA: Diagnosis not present

## 2021-01-29 DIAGNOSIS — L720 Epidermal cyst: Secondary | ICD-10-CM | POA: Diagnosis not present

## 2021-01-29 DIAGNOSIS — R532 Functional quadriplegia: Secondary | ICD-10-CM | POA: Diagnosis not present

## 2021-01-29 DIAGNOSIS — E1122 Type 2 diabetes mellitus with diabetic chronic kidney disease: Secondary | ICD-10-CM | POA: Diagnosis not present

## 2021-01-29 DIAGNOSIS — I130 Hypertensive heart and chronic kidney disease with heart failure and stage 1 through stage 4 chronic kidney disease, or unspecified chronic kidney disease: Secondary | ICD-10-CM | POA: Diagnosis not present

## 2021-01-29 DIAGNOSIS — I5032 Chronic diastolic (congestive) heart failure: Secondary | ICD-10-CM | POA: Diagnosis not present

## 2021-01-30 ENCOUNTER — Ambulatory Visit (INDEPENDENT_AMBULATORY_CARE_PROVIDER_SITE_OTHER): Payer: Medicare Other | Admitting: Family Medicine

## 2021-01-30 ENCOUNTER — Encounter: Payer: Self-pay | Admitting: Family Medicine

## 2021-01-30 VITALS — BP 142/98 | HR 101 | Temp 98.6°F

## 2021-01-30 DIAGNOSIS — R000 Tachycardia, unspecified: Secondary | ICD-10-CM | POA: Diagnosis not present

## 2021-01-30 DIAGNOSIS — I5032 Chronic diastolic (congestive) heart failure: Secondary | ICD-10-CM | POA: Diagnosis not present

## 2021-01-30 DIAGNOSIS — J8410 Pulmonary fibrosis, unspecified: Secondary | ICD-10-CM | POA: Diagnosis not present

## 2021-01-30 DIAGNOSIS — I10 Essential (primary) hypertension: Secondary | ICD-10-CM | POA: Diagnosis not present

## 2021-01-30 DIAGNOSIS — R195 Other fecal abnormalities: Secondary | ICD-10-CM | POA: Diagnosis not present

## 2021-01-30 DIAGNOSIS — R5383 Other fatigue: Secondary | ICD-10-CM | POA: Diagnosis not present

## 2021-01-30 LAB — POCT URINALYSIS DIPSTICK
Blood, UA: NEGATIVE
Glucose, UA: NEGATIVE
Ketones, UA: NEGATIVE
Leukocytes, UA: NEGATIVE
Nitrite, UA: NEGATIVE
Protein, UA: POSITIVE — AB
Spec Grav, UA: >=1.03 — AB (ref 1.010–1.025)
Urobilinogen, UA: NEGATIVE E.U./dL — AB
pH, UA: 6 (ref 5.0–8.0)

## 2021-01-30 LAB — CBC WITH DIFFERENTIAL/PLATELET
Basophils Absolute: 0.1 10*3/uL (ref 0.0–0.1)
Basophils Relative: 0.7 % (ref 0.0–3.0)
Eosinophils Absolute: 0.2 10*3/uL (ref 0.0–0.7)
Eosinophils Relative: 2.7 % (ref 0.0–5.0)
HCT: 39.2 % (ref 36.0–46.0)
Hemoglobin: 12.3 g/dL (ref 12.0–15.0)
Lymphocytes Relative: 20.3 % (ref 12.0–46.0)
Lymphs Abs: 1.4 10*3/uL (ref 0.7–4.0)
MCHC: 31.3 g/dL (ref 30.0–36.0)
MCV: 95.7 fl (ref 78.0–100.0)
Monocytes Absolute: 0.6 10*3/uL (ref 0.1–1.0)
Monocytes Relative: 8.7 % (ref 3.0–12.0)
Neutro Abs: 4.8 10*3/uL (ref 1.4–7.7)
Neutrophils Relative %: 67.6 % (ref 43.0–77.0)
Platelets: 212 10*3/uL (ref 150.0–400.0)
RBC: 4.09 Mil/uL (ref 3.87–5.11)
RDW: 15.4 % (ref 11.5–15.5)
WBC: 7.1 10*3/uL (ref 4.0–10.5)

## 2021-01-30 MED ORDER — METOPROLOL SUCCINATE ER 25 MG PO TB24
100.0000 mg | ORAL_TABLET | Freq: Every day | ORAL | 3 refills | Status: DC
Start: 2021-01-30 — End: 2021-03-11

## 2021-01-30 NOTE — Addendum Note (Signed)
Addended by: Anderson Malta on: 01/30/2021 02:52 PM   Modules accepted: Orders

## 2021-01-30 NOTE — Patient Instructions (Addendum)
The referral to [facility] from last visit appears to have been authorized.  If you have not heard from them I would try contacting their office at [phone].  Your EKG for this visit was unchanged from a few weeks ago.  We discussed increasing your Toprol XL from 75 mg (three 25 mg tabs) to 100 mg daily (four 25 mg tabs).  It is important to make sure you are checking your bp daily.

## 2021-01-30 NOTE — Progress Notes (Signed)
Subjective:    Patient ID: [name], female    DOB: [birth year], 80 y.o.   MRN: [id number]  Chief Complaint  Patient presents with   Follow-up    Elevated BP   Patient accompanied by her daughter.  HPI Patient was seen today for acute concern.  Patient endorses continued elevation in BP since discharge from hospital.  BP this morning 163/101.  Patient endorses "feeling funny/tired in chest", soreness in bilateral LEs.  Patient notes some improvement in appetite.  Having to BMs daily that are "very dark" in color.  Pt states was having dark/black stools while in the hospital.  Stools are no longer loose.  Has yet to hear from GI.  Not currently wearing portable O2 as it needed charging after an appointment this morning.  Past Medical History:  Diagnosis Date   Acute on chronic diastolic (congestive) heart failure (HCC)    Anemia    iron deficiency anemia - secondary to blood loss ( chronic)    Arthritis    endstage changes bilateral knees/bilateral ankles.    Asthma    Carotid artery occlusion    Chronic fatigue    Chronic kidney disease    Closed left hip fracture (HCC)    Clotting disorder ([place])    pt denies this   Contusion of left knee    due to fall 1/14.   COPD (chronic obstructive pulmonary disease) (HCC)    pulmonary fibrosis   Depression, reactive    Diabetes mellitus    type II    Diastolic dysfunction    Difficulty in walking    Family history of heart disease    Generalized muscle weakness    Gout    High cholesterol    History of falling    Hypertension    Hypothyroidism    Interstitial lung disease ([place])    Meningioma of left sphenoid wing involving cavernous sinus ([place]) 02/17/2012   Continue diplopia, left eye pain and left headaches.     Morbid obesity ([place])    Morbid obesity ([place])    Neuromuscular disorder ([place])    diabetic neuropathy    Normal coronary arteries    cardiac catheterization performed  10/31/14   RA (rheumatoid arthritis) ([place])     has been off methotreaxte since 10/13.   Rheumatoid arthritis (HCC)    Shortness of breath    Spinal stenosis of lumbar region    Thyroid disease    Unspecified lack of coordination    URI (upper respiratory infection)     Allergies  Allergen Reactions   Codeine Swelling and Other (See Comments)    Facial swelling Chest pain and swelling in legs  Facial swelling   Infliximab Anaphylaxis    "sent me into shock" "sent me into shock"   Lisinopril Swelling    Face and neck swelling Face and neck swelling    ROS General: Denies fever, chills, night sweats, changes in weight, changes in appetite + fatigue HEENT: Denies headaches, ear pain, changes in vision, rhinorrhea, sore throat CV: Denies CP, palpitations, SOB, orthopnea  + palpitations, "tiredness and chest" Pulm: Denies SOB, cough, wheezing GI: Denies abdominal pain, nausea, vomiting, diarrhea, constipation  + melena GU: Denies dysuria, hematuria, frequency, vaginal discharge Msk: Denies muscle cramps, joint pains  + bilateral hip and low back pain Neuro: Denies weakness, numbness, tingling Skin: Denies rashes, bruising Psych: Denies depression, anxiety, hallucinations     Objective:    Blood pressure (!) 142/98, pulse (!) 101,  temperature 98.6 F (37 C), temperature source Oral, SpO2 (!) 88 % on RA.  SpO2 increased to 95% on 2L O2 via [place].  Gen. Pleasant, well-nourished, in no distress on room air, and forearms pale/mildly cyanotic, normal affect   HEENT: [place]/AT, face symmetric, conjunctiva clear, no scleral icterus, PERRLA, EOMI, nares patent without drainage. Lungs: no accessory muscle use, CTAB, no wheezes or rales Cardiovascular: RRR, no m/r/g, 1+ bilateral LE edema Abdomen: BS present, soft, NT/ND Musculoskeletal: Mild cyanosis of bilateral hands paleness of skin no deformities, no clubbing, normal tone Neuro:  A&Ox3, CN II-XII intact, sitting in wheelchair Skin:  Warm, dry, intact.  Bilateral LE edema with  mild chronic skin changes   Wt Readings from Last 3 Encounters:  01/20/21 250 lb (113.4 kg)  11/26/20 236 lb 6.4 oz (107.2 kg)  11/22/20 236 lb 6.4 oz (107.2 kg)    Lab Results  Component Value Date   WBC 6.9 01/24/2021   HGB 11.8 (L) 01/24/2021   HCT 37.7 01/24/2021   PLT 197.0 01/24/2021   GLUCOSE 185 (H) 01/24/2021   CHOL 136 09/26/2020   TRIG 283.0 (H) 09/26/2020   HDL 40.00 09/26/2020   LDLDIRECT 59.0 09/26/2020   LDLCALC 75 09/22/2019   ALT 41 (H) 01/24/2021   AST 23 01/24/2021   NA 141 01/24/2021   K 3.7 01/24/2021   CL 101 01/24/2021   CREATININE 0.78 01/24/2021   BUN 11 01/24/2021   CO2 31 01/24/2021   TSH 4.598 (H) 01/16/2021   INR 1.0 01/16/2021   HGBA1C 5.6 09/26/2020    Assessment/Plan:  Essential hypertension  -Uncontrolled -Typically controlled on current medications including Toprol-XL 75 mg, Lasix 80 mg, -EKG with sinus rhythm, VR 99.  Similar to previous EKG from 8/31.  No acute changes. -We will increase Toprol-XL from 75 mg to 100 mg daily.  Patient to monitor BP closely. -Monitor for hypotension. -Continue follow-up with cardiology - Plan: EKG 12-Lead  Dark stools -Noted during recent hospitalization for colitis -Taking aspirin 81 mg daily -We will obtain labs to assess for anemia -Referral placed for GI at last office visit.  Per chart review referral authorized. -Plan: CBC with differential  Fatigue, unspecified type  -Discussed possible causes including anemia, infection, cardiac etiology - Plan: EKG 12-Lead, CBC with Differential/Platelet, Iron, TIBC and Ferritin Panel  Chronic diastolic CHF (congestive heart failure) (HCC) -Mild edema in bilateral LEs. -Continue Lasix 80 mg daily, Toprol-XL 75 mg daily, olmesartan 40 mg daily, [place]-Con 20 mEq -Continue metolazone 2.5 mg daily x2 days as needed for weight above 255 lbs -Continue follow-up with cardiology - Plan: EKG 12-Lead  Pulmonary fibrosis (HCC) -Started on 2 L O2 while in  clinic -Continue daily inhalers -Continue follow-up with pulmonology  Tachycardia -HR 101 -Improved after 2 L O2 via [place] in clinic -Obtain labs to assess for anemia - Plan: EKG 12-Lead, CBC with Differential/Platelet, Iron, TIBC and Ferritin Panel  F/u prn in 2-3 wks, sooner if needed  Grier Mitts, MD

## 2021-01-30 NOTE — Addendum Note (Signed)
Addended by: Grier Mitts R on: 01/30/2021 05:24 PM   Modules accepted: Orders

## 2021-01-31 ENCOUNTER — Encounter: Payer: Self-pay | Admitting: Nurse Practitioner

## 2021-01-31 ENCOUNTER — Other Ambulatory Visit: Payer: Medicare Other | Admitting: Nurse Practitioner

## 2021-01-31 ENCOUNTER — Other Ambulatory Visit: Payer: Self-pay

## 2021-01-31 ENCOUNTER — Encounter: Payer: Self-pay | Admitting: Gastroenterology

## 2021-01-31 DIAGNOSIS — R0602 Shortness of breath: Secondary | ICD-10-CM

## 2021-01-31 DIAGNOSIS — Z515 Encounter for palliative care: Secondary | ICD-10-CM

## 2021-01-31 DIAGNOSIS — R5381 Other malaise: Secondary | ICD-10-CM

## 2021-01-31 LAB — IRON,TIBC AND FERRITIN PANEL
%SAT: 28 % (calc) (ref 16–45)
Ferritin: 91 ng/mL (ref 16–288)
Iron: 62 ug/dL (ref 45–160)
TIBC: 223 mcg/dL (calc) — ABNORMAL LOW (ref 250–450)

## 2021-01-31 NOTE — Progress Notes (Signed)
[place] Consult Note Telephone: [phone]  Fax: [phone]    Date of encounter: 01/31/21 4:26 PM PATIENT NAME: [name] [address] [place] [address]   [phone] (home)  DOB: 18-Oct-1940 MRN: [id number] PRIMARY CARE PROVIDER:    [name], MD,  [place] [address] [phone]  RESPONSIBLE PARTY:    Contact Information     Name Relation Home Work Mobile   Farrow,Curtis Son [phone]  [phone]   Linna Darner [phone]  [phone]   Cybill, Uriegas Spouse [phone]  [phone]   Shelly Coss Daughter   [phone]   Jerelyn Scott Daughter   [phone]      Due to the COVID-19 crisis, this visit was done via telemedicine from my office and it was initiated and consent by this patient and or family.  I connected with  Emmarose Joan Schreffler on 01/31/21 by a telephone as video not available enabled telemedicine application and verified that I am speaking with the correct person.   I discussed the limitations of evaluation and management by telemedicine. The patient expressed understanding and agreed to proceed. Palliative Care was asked to follow this patient by consultation request of  [name], MD to address advance care planning and complex medical decision making. This is a follow up visit.                                   ASSESSMENT AND PLAN / RECOMMENDATIONS:  Symptom Management/Plan: 1. ACP: full code currently with aggressive interventions.   2. Debility secondary to deconditioning from recent hospitalization, progression of pulmonary fibrosis, chronic bronchiectasis with chronic hypoxic respiratory failure; RA; continue current regimen, pain management; encourage mobility with safety, fall risk.    3. Palliative care encounter Palliative medicine team will continue to support patient, patient's family, and medical team. Visit  consisted of counseling and education dealing with the complex and emotionally intense issues of symptom management and palliative care in the setting of serious and potentially life-threatening    4. Shortness of breath secondary to pulmonary fibrosis; CHF, COPD, continue to repeat walking testing to obtain O2 for home, inhalation therapy, monitor respiratory status, monitor edema, activity level, energy conservation  Follow up Palliative Care Visit: Palliative care will continue to follow for complex medical decision making, advance care planning, and clarification of goals. Return 6 weeks or prn.  I spent 62 minutes providing this consultation. More than 50% of the time in this consultation was spent in counseling and care coordination.  PPS: 40%  Chief Complaint: Follow up palliative consult for complex medical decision making  HISTORY OF PRESENT ILLNESS:  [name] is a 80 y.o. year old female with multiple medical problems including Chronic diastolic congestive heart failure, pulmonary fibrosis, interstitial lung disease, chronic respiratory failure with hypoxia, dysphasia, aphasia, carotid artery occlusion, COPD,  history of DVT, chronic kidney disease, Rheumatoid arthritis, anemia, meningioma of left sphenoid wing involving cavernous sinus, hypothyroidism, hypertension, gerd, spinal stenosis of lumbar region,  osteoarthritis, hypercholesterolemia, gout, morbid obesity, arthritis, cataract,  chronic pain, history of left hip fracture s/p nail, tonsillectomy, left shoulder surgery, ovarian surgery, brain surgery, abdominal hysterectomy. I called [name] for telemedicine, telephonic as video not available. We talked about how [name] has been feeling. [name] endorses she continues to be very tired, fatigue, weak from hospitalization continuing to have episodes of shortness  of breath. We talked about recent hospitalization from 01/16/2021 to 01/20/2021 for AMS with workup sepsis  physiology due to left-sided colitis, pulmonary fibrosis/bronchiectasis-chronic hypoxic respiratory failure. Acute metabolic encephalopathy resolved likely due to sepsis with dehydration. We talked about supplemental O2 at home with symptoms of sob. [name] endorses Ace Gins was able to get her a vest which she readily uses to help with breathing, really makes a difference. We talked about symptoms of pain, chronically, current regimen. We talked about appetite which remains declined. We talked about sleep patterns, sleep hygiene. We talked about her daily routine though has changed to incorporate more times of resting. We talked about medical goals, reviewed. We talked about chronic disease progression, realistic expectations with overall decline, debility, limitations including age. We talked about quality of life. We talked about role pc in poc. We talked about Joan/u pc visit with upcoming appointments. [name] in agreement prefers telemedicine as she gets tired with in person visits. Scheduled. Therapeutic listening, emotional support provided. Questions answered.   History obtained from review of EMR, discussion with Ms. Tetro.  I reviewed available labs, medications, imaging, studies and related documents from the EMR.  Records reviewed and summarized above.   ROS Full 14 system review of systems performed and negative with exception of: as per HPI.   Physical Exam: deferred  Thank you for the opportunity to participate in the care of [name].  The palliative care team will continue to follow. Please call our office at [phone] if we can be of additional assistance.   Questions and concerns were addressed. The patient/family was encouraged to call with questions and/or concerns. My contact information was provided. Provided general support and encouragement, no other unmet needs identified   This chart was dictated using voice recognition software.  Despite best efforts to  proofread,  errors can occur which can change the documentation meaning.   Vonn Sliger Ihor Gully, NP

## 2021-02-01 ENCOUNTER — Telehealth: Payer: Self-pay | Admitting: Family Medicine

## 2021-02-01 ENCOUNTER — Telehealth: Payer: Self-pay | Admitting: Cardiovascular Disease

## 2021-02-01 DIAGNOSIS — L720 Epidermal cyst: Secondary | ICD-10-CM | POA: Diagnosis not present

## 2021-02-01 DIAGNOSIS — E1122 Type 2 diabetes mellitus with diabetic chronic kidney disease: Secondary | ICD-10-CM | POA: Diagnosis not present

## 2021-02-01 DIAGNOSIS — I5032 Chronic diastolic (congestive) heart failure: Secondary | ICD-10-CM | POA: Diagnosis not present

## 2021-02-01 DIAGNOSIS — K51518 Left sided colitis with other complication: Secondary | ICD-10-CM | POA: Diagnosis not present

## 2021-02-01 DIAGNOSIS — I130 Hypertensive heart and chronic kidney disease with heart failure and stage 1 through stage 4 chronic kidney disease, or unspecified chronic kidney disease: Secondary | ICD-10-CM | POA: Diagnosis not present

## 2021-02-01 DIAGNOSIS — R532 Functional quadriplegia: Secondary | ICD-10-CM | POA: Diagnosis not present

## 2021-02-01 NOTE — Telephone Encounter (Signed)
Attempted to contact pt. Nurse continue to receive busy signal.

## 2021-02-01 NOTE — Telephone Encounter (Signed)
[name] called from [facility] to advise that when she saw the PT today she had a high heart rate. At rest it was 106, with excerising it was 111-119, when oxygen was put on her it stayed at 106-108. She had no chest tightness or any other symptoms. For further questions please call [name] at [phone].

## 2021-02-01 NOTE — Telephone Encounter (Signed)
STAT if HR is under 50 or over 120 (normal HR is 60-100 beats per minute)  What is your heart rate? Went up to 119 today, not sure what it is now  Do you have a log of your heart rate readings (document readings)? 106, 119  Do you have any other symptoms? Patient states her HR was high today and she was recently in the hospital. She says her BP medications were also adjusted. She states she is not having any symptoms.

## 2021-02-05 ENCOUNTER — Ambulatory Visit (INDEPENDENT_AMBULATORY_CARE_PROVIDER_SITE_OTHER): Payer: Medicare Other | Admitting: Gastroenterology

## 2021-02-05 ENCOUNTER — Encounter: Payer: Self-pay | Admitting: Gastroenterology

## 2021-02-05 VITALS — BP 130/76 | HR 92 | Ht 64.0 in | Wt 230.5 lb

## 2021-02-05 DIAGNOSIS — K580 Irritable bowel syndrome with diarrhea: Secondary | ICD-10-CM | POA: Diagnosis not present

## 2021-02-05 DIAGNOSIS — K559 Vascular disorder of intestine, unspecified: Secondary | ICD-10-CM | POA: Diagnosis not present

## 2021-02-05 NOTE — Progress Notes (Signed)
[name]    [id number]    [birth year]  Primary Care Physician:Banks, Langley Adie, MD  Referring Physician: [name], MD [place],   [id number]   Chief complaint: Colitis  HPI:  80 year old very pleasant female here for new patient visit for follow-up of colitis that was noted on CT during recent hospitalization  She was discharged from the hospital on January 20, 2021  Continues to have intermittent abdominal pain and diarrhea.  Denies any rectal bleeding or melena.  She has history of pulmonary fibrosis, bronchiectasis with chronic hypoxic respiratory failure, right heart failure, rheumatoid arthritis on prednisone, was hospitalized on August 21 with altered mental status and sepsis.  She was treated with antibiotics and supportive care  CTA chest abdomen and pelvis January 16, 2021: Negative for acute vascular abnormality Mild circumferential thickening of descending colon, no pericologaurd stranding or evidence of diverticulitis Small nonobstructive right nephrolithiasis   Echocardiogram June 04, 2018 showed LVEF 60 to 65%, mildly dilated RV and mildly elevated pulmonary artery systolic pressure EGD by Dr. Deatra Ina in April 2016 for dysphagia showed ultrashort segment of possible Barrett's, biopsies negative for Barrett's esophagus, showed mild gastroesophageal reflux related changes.  Gastritis otherwise normal EGD.  No dilation was performed   Outpatient Encounter Medications as of 02/05/2021  Medication Sig   acetaminophen (TYLENOL) 500 MG tablet Take 1,000 mg by mouth every 6 (six) hours as needed for moderate pain.    albuterol (PROVENTIL HFA;VENTOLIN HFA) 108 (90 BASE) MCG/ACT inhaler Inhale 2 puffs into the lungs every 6 (six) hours as needed for wheezing or shortness of breath.   allopurinol (ZYLOPRIM) 300 MG tablet Take 300 mg by mouth daily.   Artificial Tear Ointment (DRY EYES OP) Apply 1 drop to eye daily as  needed (dry eyes).   aspirin buffered (BUFFERIN) 325 MG TABS tablet Take 325 mg by mouth daily.   atorvastatin (LIPITOR) 20 MG tablet Take 0.5 tablets (10 mg total) by mouth every evening.   azithromycin (ZITHROMAX) 250 MG tablet Take 2 tabs on day one.  Then take 1 tab daily on days 2-7. (Patient taking differently: Take 1 tablet by mouth Mon, Wed, and Fri)   Calcium Carb-Cholecalciferol (CALCIUM + D3 PO) Take 2 tablets by mouth daily with lunch.    cholecalciferol (VITAMIN D) 1000 UNITS tablet Take 1,000 Units by mouth daily with lunch.   colchicine 0.6 MG tablet Take 0.6 mg by mouth daily as needed (gout flares).   cyclobenzaprine (FLEXERIL) 5 MG tablet Take 1 tablet (5 mg total) by mouth at bedtime as needed for muscle spasms.   diclofenac sodium (VOLTAREN) 1 % GEL Apply 2 g topically 3 (three) times daily. Both hands and knees   DULoxetine (CYMBALTA) 60 MG capsule Take 1 capsule (60 mg total) by mouth daily.   fluticasone (FLONASE) 50 MCG/ACT nasal spray Place 2 sprays into both nostrils daily.   furosemide (LASIX) 80 MG tablet Take 1 tablet (80 mg total) by mouth daily.   gabapentin (NEURONTIN) 600 MG tablet Take two in the morning , Two mid- afternoon and two at bedtime (Patient taking differently: Take 600 mg by mouth 3 (three) times daily.)   guaiFENesin (MUCINEX) 600 MG 12 hr tablet Take 1,200 mg by mouth 2 (two) times daily.   hydroxychloroquine (PLAQUENIL) 200 MG tablet Take 200 mg by mouth daily.   ipratropium-albuterol (DUONEB) 0.5-2.5 (3) MG/3ML SOLN Take 3 mLs by nebulization  every 6 (six) hours as needed (cough/wheezing).    leflunomide (ARAVA) 20 MG tablet Take 1 tablet (20 mg total) by mouth daily.   levothyroxine (SYNTHROID) 50 MCG tablet Take 1 tablet (50 mcg total) by mouth daily.   linaclotide (LINZESS) 72 MCG capsule Take 1 capsule (72 mcg total) by mouth daily before breakfast.   metolazone (ZAROXOLYN) 2.5 MG tablet TAKE 1 TABLET BY MOUTH DAILY AS NEEDED FOR 2 DAYS FOR  WEIGHT ABOVE 255 POUNDS   metoprolol succinate (TOPROL-XL) 25 MG 24 hr tablet Take 4 tablets (100 mg total) by mouth daily.   morphine (MS CONTIN) 15 MG 12 hr tablet Take 1 tablet (15 mg total) by mouth every 12 (twelve) hours.   morphine (MSIR) 15 MG tablet One tablet twice a day as needed for break through pain. (Patient taking differently: Take 15 mg by mouth 2 (two) times daily as needed for moderate pain. One tablet twice a day as needed for break through pain.)   Multiple Vitamin (MULTIVITAMIN WITH MINERALS) TABS Take 1 tablet by mouth daily.    olmesartan (BENICAR) 40 MG tablet Take 1 tablet (40 mg total) by mouth daily.   OXYGEN-HELIUM IN Inhale 2 L into the lungs at bedtime. And on exertion   pantoprazole (PROTONIX) 40 MG tablet take 1 tablet by mouth once daily MINUTES BEFORE 1ST MEAL OF THE DAY (Patient taking differently: Take 40 mg by mouth daily.)   potassium chloride SA (KLOR-CON) 20 MEQ tablet TAKE 1 TABLET(20 MEQ) BY MOUTH DAILY (Patient taking differently: Take 20 mEq by mouth daily.)   predniSONE (DELTASONE) 5 MG tablet Take 10 mg by mouth every morning.    Semaglutide,0.25 or 0.5MG/DOS, (OZEMPIC, 0.25 OR 0.5 MG/DOSE,) 2 MG/1.5ML SOPN Inject 0.5 mg into the skin once a week. (Patient taking differently: Inject 0.5 mg into the skin every Wednesday.)   sodium chloride (OCEAN) 0.65 % SOLN nasal spray Place 1 spray into both nostrils as needed for congestion.   Spacer/Aero-Holding Chambers (AEROCHAMBER MV) inhaler Use as instructed   No facility-administered encounter medications on file as of 02/05/2021.    Allergies as of 02/05/2021 - Review Complete 02/05/2021  Allergen Reaction Noted   Codeine Swelling and Other (See Comments) 09/11/2011   Infliximab Anaphylaxis 09/11/2011   Lisinopril Swelling 09/11/2011    Past Medical History:  Diagnosis Date   Acute on chronic diastolic (congestive) heart failure (HCC)    Anemia    iron deficiency anemia - secondary to blood loss (  chronic)    Anxiety    Arthritis    endstage changes bilateral knees/bilateral ankles.    Asthma    Carotid artery occlusion    Chronic fatigue    Chronic kidney disease    Closed left hip fracture (HCC)    Clotting disorder ([place])    pt denies this   Contusion of left knee    due to fall 1/14.   COPD (chronic obstructive pulmonary disease) (HCC)    pulmonary fibrosis   Depression, reactive    Diabetes mellitus    type II    Diastolic dysfunction    Difficulty in walking    Family history of heart disease    Generalized muscle weakness    Gout    High cholesterol    History of falling    Hypertension    Hypothyroidism    Interstitial lung disease ([place])    Meningioma of left sphenoid wing involving cavernous sinus (HCC) 02/17/2012   Continue diplopia, left  eye pain and left headaches.     Morbid obesity ([place])    Morbid obesity ([place])    Neuromuscular disorder ([place])    diabetic neuropathy    Normal coronary arteries    cardiac catheterization performed  10/31/14   Pneumonia    RA (rheumatoid arthritis) ([place])    has been off methotreaxte since 10/13.   Rheumatoid arthritis (HCC)    Shortness of breath    Spinal stenosis of lumbar region    Thyroid disease    Unspecified lack of coordination    URI (upper respiratory infection)     Past Surgical History:  Procedure Laterality Date   ABDOMINAL HYSTERECTOMY     BRAIN SURGERY     Gamma knife 10/13. Needs repeat spring  '14   CARDIAC CATHETERIZATION N/A 10/31/2014   Procedure: Right/Left Heart Cath and Coronary Angiography;  Surgeon: [name], MD;  Location: [facility];  Service: Cardiovascular;  Laterality: N/A;   ESOPHAGOGASTRODUODENOSCOPY (EGD) WITH PROPOFOL N/A 09/14/2014   Procedure: ESOPHAGOGASTRODUODENOSCOPY (EGD) WITH PROPOFOL;  Surgeon: [name], MD;  Location: [facility];  Service: Endoscopy;  Laterality: N/A;   INCISION AND DRAINAGE HIP Left 01/16/2017   Procedure: IRRIGATION AND  DEBRIDEMENT LEFT HIP;  Surgeon: [name], MD;  Location: [facility];  Service: Orthopedics;  Laterality: Left;   INTRAMEDULLARY (IM) NAIL INTERTROCHANTERIC Left 11/29/2016   Procedure: INTRAMEDULLARY (IM) NAIL INTERTROCHANTRIC;  Surgeon: [name], MD;  Location: [facility];  Service: Orthopedics;  Laterality: Left;   OVARY SURGERY     SHOULDER SURGERY Left    TONSILLECTOMY  age 16   VIDEO BRONCHOSCOPY Bilateral 05/31/2013   Procedure: VIDEO BRONCHOSCOPY WITHOUT FLUORO;  Surgeon: Brand Males, MD;  Location: [facility];  Service: Cardiopulmonary;  Laterality: Bilateral;   video bronscoscopy  2000   lung    Family History  Problem Relation Age of Onset   Diabetes Mother    Heart attack Mother    Hypertension Father    Lung cancer Father    Diabetes Sister    Breast cancer Sister    Breast cancer Sister    Diabetes Brother    Hypertension Brother    Heart disease Brother    Heart attack Brother    Kidney cancer Brother    Gout Brother    Kidney failure Brother        x 5   Esophageal cancer Brother    Stomach cancer Brother    Prostate cancer Brother    Uterine cancer Daughter    Hypertension Daughter    Hypertension Daughter    Rheum arthritis Maternal Uncle    Hypertension Son    Heart murmur Son     Social History   Socioeconomic History   Marital status: Married    Spouse name: Not on file   Number of children: 6   Years of education: college   Highest education level: Not on file  Occupational History   Occupation: retired Marine scientist  Tobacco Use   Smoking status: Never   Smokeless tobacco: Never  Vaping Use   Vaping Use: Never used  Substance and Sexual Activity   Alcohol use: No   Drug use: No   Sexual activity: Not Currently  Other Topics Concern   Not on file  Social History Narrative   Patient consumes 2-3 cups coffee per day.   Social Determinants of Health   Financial Resource Strain: Low Risk    Difficulty of Paying  Living Expenses: Not hard at all  Food Insecurity: No Food Insecurity   Worried About [place] in the Last Year: Never true   Ran Out of Food in the Last Year: Never true  Transportation Needs: No Transportation Needs   Lack of Transportation (Medical): No   Lack of Transportation (Non-Medical): No  Physical Activity: Not on file  Stress: No Stress Concern Present   Feeling of Stress : Not at all  Social Connections: Moderately Integrated   Frequency of Communication with Friends and Family: Twice a week   Frequency of Social Gatherings with Friends and Family: Twice a week   Attends Religious Services: 1 to 4 times per year   Active Member of [employer] or Organizations: No   Attends Music therapist: Never   Marital Status: Married  Human resources officer Violence: Not At Risk   Fear of Current or Ex-Partner: No   Emotionally Abused: No   Physically Abused: No   Sexually Abused: No      Review of systems: All other review of systems negative except as mentioned in the HPI.   Physical Exam: Vitals:   02/05/21 0839  BP: 130/76  Pulse: 92   Body mass index is 39.57 kg/m. Gen:      No acute distress HEENT:  sclera anicteric Abd:      soft, non-tender; no palpable masses, no distension Ext:    No edema Neuro: alert and oriented x 3 Psych: normal mood and affect  Data Reviewed:  Reviewed labs, radiology imaging, old records and pertinent past GI work up   Assessment and Plan/Recommendations:  80 year old very pleasant female with history of pulmonary fibrosis, bronchiectasis with chronic hypoxic respiratory failure, right heart failure, rheumatoid arthritis on prednisone here for follow-up visit after recent hospitalization with findings of left-sided colitis Most likely etiology of colitis noted on CT scan is ischemic colitis, her symptoms have mostly resolved No plan for further imaging or work-up at this point  She continues to have intermittent  abdominal discomfort and diarrhea, likely secondary to irritable bowel syndrome predominant diarrhea Use probiotic align 1 capsule daily for 2 to 4 weeks Slowly advance diet as tolerated  Return as needed   The patient was provided an opportunity to ask questions and all were answered. The patient agreed with the plan and demonstrated an understanding of the instructions.  Damaris Hippo , MD    CC: [name], MD

## 2021-02-05 NOTE — Patient Instructions (Signed)
Advanced diet slowly as tolerated  Use Probiotic Align 1 daily for 2 weeks  Follow up as needed  If you are age 79 or older, your body mass index should be between 23-30. Your Body mass index is 39.57 kg/m. If this is out of the aforementioned range listed, please consider follow up with your Primary Care Provider.  If you are age 76 or younger, your body mass index should be between 19-25. Your Body mass index is 39.57 kg/m. If this is out of the aformentioned range listed, please consider follow up with your Primary Care Provider.   __________________________________________________________  The [place] [facility] providers would like to encourage you to use [facility] to communicate with providers for non-urgent requests or questions.  Due to long hold times on the telephone, sending your provider a message by [facility] may be a faster and more efficient way to get a response.  Please allow 48 business hours for a response.  Please remember that this is for non-urgent requests.    I appreciate the  opportunity to care for you  Thank You   Harl Bowie , MD

## 2021-02-06 DIAGNOSIS — R532 Functional quadriplegia: Secondary | ICD-10-CM | POA: Diagnosis not present

## 2021-02-06 DIAGNOSIS — I130 Hypertensive heart and chronic kidney disease with heart failure and stage 1 through stage 4 chronic kidney disease, or unspecified chronic kidney disease: Secondary | ICD-10-CM | POA: Diagnosis not present

## 2021-02-06 DIAGNOSIS — I5032 Chronic diastolic (congestive) heart failure: Secondary | ICD-10-CM | POA: Diagnosis not present

## 2021-02-06 DIAGNOSIS — L720 Epidermal cyst: Secondary | ICD-10-CM | POA: Diagnosis not present

## 2021-02-06 DIAGNOSIS — K51518 Left sided colitis with other complication: Secondary | ICD-10-CM | POA: Diagnosis not present

## 2021-02-06 DIAGNOSIS — E1122 Type 2 diabetes mellitus with diabetic chronic kidney disease: Secondary | ICD-10-CM | POA: Diagnosis not present

## 2021-02-07 DIAGNOSIS — I5032 Chronic diastolic (congestive) heart failure: Secondary | ICD-10-CM | POA: Diagnosis not present

## 2021-02-07 DIAGNOSIS — K51518 Left sided colitis with other complication: Secondary | ICD-10-CM | POA: Diagnosis not present

## 2021-02-07 DIAGNOSIS — R532 Functional quadriplegia: Secondary | ICD-10-CM | POA: Diagnosis not present

## 2021-02-07 DIAGNOSIS — E1122 Type 2 diabetes mellitus with diabetic chronic kidney disease: Secondary | ICD-10-CM | POA: Diagnosis not present

## 2021-02-07 DIAGNOSIS — I130 Hypertensive heart and chronic kidney disease with heart failure and stage 1 through stage 4 chronic kidney disease, or unspecified chronic kidney disease: Secondary | ICD-10-CM | POA: Diagnosis not present

## 2021-02-07 DIAGNOSIS — L720 Epidermal cyst: Secondary | ICD-10-CM | POA: Diagnosis not present

## 2021-02-08 DIAGNOSIS — I130 Hypertensive heart and chronic kidney disease with heart failure and stage 1 through stage 4 chronic kidney disease, or unspecified chronic kidney disease: Secondary | ICD-10-CM | POA: Diagnosis not present

## 2021-02-08 DIAGNOSIS — K51518 Left sided colitis with other complication: Secondary | ICD-10-CM | POA: Diagnosis not present

## 2021-02-08 DIAGNOSIS — I5032 Chronic diastolic (congestive) heart failure: Secondary | ICD-10-CM | POA: Diagnosis not present

## 2021-02-08 DIAGNOSIS — R532 Functional quadriplegia: Secondary | ICD-10-CM | POA: Diagnosis not present

## 2021-02-08 DIAGNOSIS — E1122 Type 2 diabetes mellitus with diabetic chronic kidney disease: Secondary | ICD-10-CM | POA: Diagnosis not present

## 2021-02-08 DIAGNOSIS — L720 Epidermal cyst: Secondary | ICD-10-CM | POA: Diagnosis not present

## 2021-02-12 DIAGNOSIS — L720 Epidermal cyst: Secondary | ICD-10-CM | POA: Diagnosis not present

## 2021-02-12 DIAGNOSIS — R532 Functional quadriplegia: Secondary | ICD-10-CM | POA: Diagnosis not present

## 2021-02-12 DIAGNOSIS — I130 Hypertensive heart and chronic kidney disease with heart failure and stage 1 through stage 4 chronic kidney disease, or unspecified chronic kidney disease: Secondary | ICD-10-CM | POA: Diagnosis not present

## 2021-02-12 DIAGNOSIS — I5032 Chronic diastolic (congestive) heart failure: Secondary | ICD-10-CM | POA: Diagnosis not present

## 2021-02-12 DIAGNOSIS — K51518 Left sided colitis with other complication: Secondary | ICD-10-CM | POA: Diagnosis not present

## 2021-02-12 DIAGNOSIS — E1122 Type 2 diabetes mellitus with diabetic chronic kidney disease: Secondary | ICD-10-CM | POA: Diagnosis not present

## 2021-02-12 NOTE — Telephone Encounter (Signed)
Unable to reach pt Pt's home number phone just rings and no one answers and mobile number recording came up first time saying no longer in service and tried again and received busy signal  Will await a return call from pt ./cy

## 2021-02-13 ENCOUNTER — Encounter: Payer: Self-pay | Admitting: Physical Medicine & Rehabilitation

## 2021-02-13 ENCOUNTER — Other Ambulatory Visit: Payer: Self-pay

## 2021-02-13 ENCOUNTER — Encounter: Payer: Medicare Other | Attending: Physical Medicine & Rehabilitation | Admitting: Physical Medicine & Rehabilitation

## 2021-02-13 VITALS — BP 149/90 | HR 87 | Temp 98.7°F | Ht 64.0 in | Wt 231.8 lb

## 2021-02-13 DIAGNOSIS — I5032 Chronic diastolic (congestive) heart failure: Secondary | ICD-10-CM | POA: Diagnosis not present

## 2021-02-13 DIAGNOSIS — I130 Hypertensive heart and chronic kidney disease with heart failure and stage 1 through stage 4 chronic kidney disease, or unspecified chronic kidney disease: Secondary | ICD-10-CM | POA: Diagnosis not present

## 2021-02-13 DIAGNOSIS — R5382 Chronic fatigue, unspecified: Secondary | ICD-10-CM | POA: Diagnosis not present

## 2021-02-13 DIAGNOSIS — M170 Bilateral primary osteoarthritis of knee: Secondary | ICD-10-CM

## 2021-02-13 DIAGNOSIS — E1122 Type 2 diabetes mellitus with diabetic chronic kidney disease: Secondary | ICD-10-CM | POA: Diagnosis not present

## 2021-02-13 DIAGNOSIS — L720 Epidermal cyst: Secondary | ICD-10-CM | POA: Diagnosis not present

## 2021-02-13 DIAGNOSIS — R532 Functional quadriplegia: Secondary | ICD-10-CM | POA: Diagnosis not present

## 2021-02-13 DIAGNOSIS — K51518 Left sided colitis with other complication: Secondary | ICD-10-CM | POA: Diagnosis not present

## 2021-02-13 MED ORDER — DULOXETINE HCL 60 MG PO CPEP: 60.0000 mg | ORAL_CAPSULE | Freq: Every day | ORAL | 5 refills | Status: DC

## 2021-02-13 MED ORDER — MORPHINE SULFATE 15 MG PO TABS: ORAL_TABLET | ORAL | 0 refills | Status: DC

## 2021-02-13 MED ORDER — MORPHINE SULFATE ER 15 MG PO TBCR: 15.0000 mg | EXTENDED_RELEASE_TABLET | Freq: Two times a day (BID) | ORAL | 0 refills | Status: DC

## 2021-02-13 NOTE — Progress Notes (Signed)
PROCEDURE NOTE  DIAGNOSIS:  OA bilateral knees  INTERVENTION:   ZILRETTA INJECTION     After informed consent and preparation of the skin with betadine and isopropyl alcohol, I injected 32MG of zilretta dissolved into 5cc of diluent into bilateral knees via anterolateral approach. Contents of syring were shaken vigorously and aspiration was performed prior to injection. The patient tolerated well, and no complications were encountered. Afterward the area was cleaned and dressed. Post- injection instructions were provided including ice  if swelling or pain should occur.    Meredith Staggers, MD, [place] [facility] 02/13/2021

## 2021-02-13 NOTE — Patient Instructions (Signed)
PLEASE FEEL FREE TO CALL OUR OFFICE WITH ANY PROBLEMS OR QUESTIONS ([phone])

## 2021-02-14 DIAGNOSIS — I5032 Chronic diastolic (congestive) heart failure: Secondary | ICD-10-CM | POA: Diagnosis not present

## 2021-02-14 DIAGNOSIS — L720 Epidermal cyst: Secondary | ICD-10-CM | POA: Diagnosis not present

## 2021-02-14 DIAGNOSIS — E1122 Type 2 diabetes mellitus with diabetic chronic kidney disease: Secondary | ICD-10-CM | POA: Diagnosis not present

## 2021-02-14 DIAGNOSIS — I130 Hypertensive heart and chronic kidney disease with heart failure and stage 1 through stage 4 chronic kidney disease, or unspecified chronic kidney disease: Secondary | ICD-10-CM | POA: Diagnosis not present

## 2021-02-14 DIAGNOSIS — R532 Functional quadriplegia: Secondary | ICD-10-CM | POA: Diagnosis not present

## 2021-02-14 DIAGNOSIS — K51518 Left sided colitis with other complication: Secondary | ICD-10-CM | POA: Diagnosis not present

## 2021-02-15 ENCOUNTER — Encounter: Payer: Self-pay | Admitting: Gastroenterology

## 2021-02-19 ENCOUNTER — Other Ambulatory Visit: Payer: Self-pay

## 2021-02-20 ENCOUNTER — Telehealth: Payer: Self-pay | Admitting: Family Medicine

## 2021-02-20 ENCOUNTER — Encounter: Payer: Self-pay | Admitting: Family Medicine

## 2021-02-20 ENCOUNTER — Ambulatory Visit (INDEPENDENT_AMBULATORY_CARE_PROVIDER_SITE_OTHER): Payer: Medicare Other | Admitting: Family Medicine

## 2021-02-20 VITALS — BP 130/86 | HR 92 | Temp 98.7°F

## 2021-02-20 DIAGNOSIS — I5032 Chronic diastolic (congestive) heart failure: Secondary | ICD-10-CM | POA: Diagnosis not present

## 2021-02-20 DIAGNOSIS — E1122 Type 2 diabetes mellitus with diabetic chronic kidney disease: Secondary | ICD-10-CM | POA: Diagnosis not present

## 2021-02-20 DIAGNOSIS — I10 Essential (primary) hypertension: Secondary | ICD-10-CM | POA: Diagnosis not present

## 2021-02-20 DIAGNOSIS — R532 Functional quadriplegia: Secondary | ICD-10-CM | POA: Diagnosis not present

## 2021-02-20 DIAGNOSIS — K51518 Left sided colitis with other complication: Secondary | ICD-10-CM | POA: Diagnosis not present

## 2021-02-20 DIAGNOSIS — L720 Epidermal cyst: Secondary | ICD-10-CM | POA: Diagnosis not present

## 2021-02-20 DIAGNOSIS — I130 Hypertensive heart and chronic kidney disease with heart failure and stage 1 through stage 4 chronic kidney disease, or unspecified chronic kidney disease: Secondary | ICD-10-CM | POA: Diagnosis not present

## 2021-02-20 MED ORDER — BLOOD PRESSURE MONITOR/ARM DEVI: 0 refills | Status: DC

## 2021-02-20 NOTE — Telephone Encounter (Signed)
[name] called on behalf of the patient asking if the patient would be eligible for a prescription blood pressure monitor between her Medicare and her [employer].  A contact number for [name] is [phone].  Please advise.

## 2021-02-20 NOTE — Telephone Encounter (Signed)
Spoke with pt, informed her prescription was sent to local pharmacy for a BP cuff.

## 2021-02-20 NOTE — Progress Notes (Signed)
Subjective:    Patient ID: [name], female    DOB: 11-Sep-1940, 80 y.o.   MRN: [id number]  Chief Complaint  Patient presents with   Follow-up    BP  Pt accompanied by her daughter.  HPI Patient was seen today for f/u on HTN.  Metoprolol increased from 75 mg to 100 mg daily at last [facility] 01/30/21.  Patient notes BP elevated at home 174/98, 160/114, 169/104, 159/95.  Pt notes some increased stress dealing with her husband's health and anxiety as she is worried about her blood pressure.  Pt denies feeling bad, HAs, CP, changes in vision.  Actually notes improvement since hospitalization on 01/16/2021.  Appetite improving.  Unsure if BP cuff is the correct size as pt has lost weight.  BP 159/95 today before meds.  Past Medical History:  Diagnosis Date   Acute on chronic diastolic (congestive) heart failure (HCC)    Anemia    iron deficiency anemia - secondary to blood loss ( chronic)    Anxiety    Arthritis    endstage changes bilateral knees/bilateral ankles.    Asthma    Carotid artery occlusion    Chronic fatigue    Chronic kidney disease    Closed left hip fracture (HCC)    Clotting disorder ([place])    pt denies this   Contusion of left knee    due to fall 1/14.   COPD (chronic obstructive pulmonary disease) (HCC)    pulmonary fibrosis   Depression, reactive    Diabetes mellitus    type II    Diastolic dysfunction    Difficulty in walking    Family history of heart disease    Generalized muscle weakness    Gout    High cholesterol    History of falling    Hypertension    Hypothyroidism    Interstitial lung disease ([place])    Meningioma of left sphenoid wing involving cavernous sinus ([place]) 02/17/2012   Continue diplopia, left eye pain and left headaches.     Morbid obesity ([place])    Morbid obesity ([place])    Neuromuscular disorder ([place])    diabetic neuropathy    Normal coronary arteries    cardiac catheterization performed  10/31/14   Pneumonia    RA (rheumatoid  arthritis) ([place])    has been off methotreaxte since 10/13.   Rheumatoid arthritis (HCC)    Shortness of breath    Spinal stenosis of lumbar region    Thyroid disease    Unspecified lack of coordination    URI (upper respiratory infection)     Allergies  Allergen Reactions   Codeine Swelling and Other (See Comments)    Facial swelling Chest pain and swelling in legs  Facial swelling   Infliximab Anaphylaxis    "sent me into shock" "sent me into shock"   Lisinopril Swelling    Face and neck swelling Face and neck swelling    ROS General: Denies fever, chills, night sweats, changes in weight, changes in appetite HEENT: Denies headaches, ear pain, changes in vision, rhinorrhea, sore throat CV: Denies CP, palpitations, SOB, orthopnea Pulm: Denies SOB, cough, wheezing GI: Denies abdominal pain, nausea, vomiting, diarrhea, constipation GU: Denies dysuria, hematuria, frequency, vaginal discharge Msk: Denies muscle cramps, joint pains Neuro: Denies weakness, numbness, tingling Skin: Denies rashes, bruising Psych: Denies depression, anxiety, hallucinations     Objective:    Blood pressure 130/86, pulse 92, temperature 98.7 F (37.1 C), temperature source Oral, SpO2 94 %.  Gen. Pleasant, well-nourished, in no distress, normal affect   HEENT: [place]/AT, face symmetric, conjunctiva clear, no scleral icterus, PERRLA, EOMI, nares patent without drainage Lungs: no accessory muscle use, CTAB, no wheezes or rales Cardiovascular: RRR, no m/r/g, trace edema in b/l LEs Musculoskeletal: No deformities, no cyanosis or clubbing, normal tone Neuro:  A&Ox3, CN II-XII intact.  Gait not assessed sitting in transport wheelchair. Skin:  Warm, no lesions/ rash   Wt Readings from Last 3 Encounters:  02/13/21 231 lb 12.8 oz (105.1 kg)  02/05/21 230 lb 8 oz (104.6 kg)  01/20/21 250 lb (113.4 kg)    Lab Results  Component Value Date   WBC 7.1 01/30/2021   HGB 12.3 01/30/2021   HCT 39.2  01/30/2021   PLT 212.0 01/30/2021   GLUCOSE 185 (H) 01/24/2021   CHOL 136 09/26/2020   TRIG 283.0 (H) 09/26/2020   HDL 40.00 09/26/2020   LDLDIRECT 59.0 09/26/2020   LDLCALC 75 09/22/2019   ALT 41 (H) 01/24/2021   AST 23 01/24/2021   NA 141 01/24/2021   K 3.7 01/24/2021   CL 101 01/24/2021   CREATININE 0.78 01/24/2021   BUN 11 01/24/2021   CO2 31 01/24/2021   TSH 4.598 (H) 01/16/2021   INR 1.0 01/16/2021   HGBA1C 5.6 09/26/2020    Assessment/Plan:  Essential hypertension -uncontrolled at home.  Stable in office 130/86 -will have pt obtain new bp cuff for home use.   -continue to monitor over the next few days.  For continued elevation >140/90 will increase Toprol XL from 100 mg daily to 125 mg daily. -for now continue current meds: Toprol XL 100 mg daily, lasix 80 mg daily, olmesartan 40 mg daily -continue lifestyle modifications  Chronic diastolic CHF (congestive heart failure) (HCC) -stable, euvolemic -to take lasix after this appt as has not taken daily dose yet -continue current meds including Toprol-XL 100 mg daily, Lasix 80 mg daily, olmesartan 40 mg daily -Continue low-sodium diet, daily weights. -Supportive care as needed for LE edema including elevation, compression socks or TED hose. -Continue follow-up cardiology  F/u in 2-4 wks, sooner if needed  Grier Mitts, MD

## 2021-02-21 DIAGNOSIS — L720 Epidermal cyst: Secondary | ICD-10-CM | POA: Diagnosis not present

## 2021-02-21 DIAGNOSIS — R532 Functional quadriplegia: Secondary | ICD-10-CM | POA: Diagnosis not present

## 2021-02-21 DIAGNOSIS — E1122 Type 2 diabetes mellitus with diabetic chronic kidney disease: Secondary | ICD-10-CM | POA: Diagnosis not present

## 2021-02-21 DIAGNOSIS — I130 Hypertensive heart and chronic kidney disease with heart failure and stage 1 through stage 4 chronic kidney disease, or unspecified chronic kidney disease: Secondary | ICD-10-CM | POA: Diagnosis not present

## 2021-02-21 DIAGNOSIS — K51518 Left sided colitis with other complication: Secondary | ICD-10-CM | POA: Diagnosis not present

## 2021-02-21 DIAGNOSIS — I5032 Chronic diastolic (congestive) heart failure: Secondary | ICD-10-CM | POA: Diagnosis not present

## 2021-02-22 DIAGNOSIS — E1122 Type 2 diabetes mellitus with diabetic chronic kidney disease: Secondary | ICD-10-CM | POA: Diagnosis not present

## 2021-02-22 DIAGNOSIS — I5032 Chronic diastolic (congestive) heart failure: Secondary | ICD-10-CM | POA: Diagnosis not present

## 2021-02-22 DIAGNOSIS — I130 Hypertensive heart and chronic kidney disease with heart failure and stage 1 through stage 4 chronic kidney disease, or unspecified chronic kidney disease: Secondary | ICD-10-CM | POA: Diagnosis not present

## 2021-02-22 DIAGNOSIS — K51518 Left sided colitis with other complication: Secondary | ICD-10-CM | POA: Diagnosis not present

## 2021-02-22 DIAGNOSIS — R532 Functional quadriplegia: Secondary | ICD-10-CM | POA: Diagnosis not present

## 2021-02-22 DIAGNOSIS — L720 Epidermal cyst: Secondary | ICD-10-CM | POA: Diagnosis not present

## 2021-02-24 DIAGNOSIS — K51518 Left sided colitis with other complication: Secondary | ICD-10-CM | POA: Diagnosis not present

## 2021-02-24 DIAGNOSIS — J9611 Chronic respiratory failure with hypoxia: Secondary | ICD-10-CM | POA: Diagnosis not present

## 2021-02-24 DIAGNOSIS — M159 Polyosteoarthritis, unspecified: Secondary | ICD-10-CM | POA: Diagnosis not present

## 2021-02-24 DIAGNOSIS — J479 Bronchiectasis, uncomplicated: Secondary | ICD-10-CM | POA: Diagnosis not present

## 2021-02-24 DIAGNOSIS — J8410 Pulmonary fibrosis, unspecified: Secondary | ICD-10-CM | POA: Diagnosis not present

## 2021-02-24 DIAGNOSIS — D62 Acute posthemorrhagic anemia: Secondary | ICD-10-CM | POA: Diagnosis not present

## 2021-02-24 DIAGNOSIS — E1142 Type 2 diabetes mellitus with diabetic polyneuropathy: Secondary | ICD-10-CM | POA: Diagnosis not present

## 2021-02-24 DIAGNOSIS — G894 Chronic pain syndrome: Secondary | ICD-10-CM | POA: Diagnosis not present

## 2021-02-24 DIAGNOSIS — Z9981 Dependence on supplemental oxygen: Secondary | ICD-10-CM | POA: Diagnosis not present

## 2021-02-24 DIAGNOSIS — E039 Hypothyroidism, unspecified: Secondary | ICD-10-CM | POA: Diagnosis not present

## 2021-02-24 DIAGNOSIS — Z6841 Body Mass Index (BMI) 40.0 and over, adult: Secondary | ICD-10-CM | POA: Diagnosis not present

## 2021-02-24 DIAGNOSIS — R532 Functional quadriplegia: Secondary | ICD-10-CM | POA: Diagnosis not present

## 2021-02-24 DIAGNOSIS — I5032 Chronic diastolic (congestive) heart failure: Secondary | ICD-10-CM | POA: Diagnosis not present

## 2021-02-24 DIAGNOSIS — F32A Depression, unspecified: Secondary | ICD-10-CM | POA: Diagnosis not present

## 2021-02-24 DIAGNOSIS — M48061 Spinal stenosis, lumbar region without neurogenic claudication: Secondary | ICD-10-CM | POA: Diagnosis not present

## 2021-02-24 DIAGNOSIS — M069 Rheumatoid arthritis, unspecified: Secondary | ICD-10-CM | POA: Diagnosis not present

## 2021-02-24 DIAGNOSIS — N189 Chronic kidney disease, unspecified: Secondary | ICD-10-CM | POA: Diagnosis not present

## 2021-02-24 DIAGNOSIS — L720 Epidermal cyst: Secondary | ICD-10-CM | POA: Diagnosis not present

## 2021-02-24 DIAGNOSIS — M549 Dorsalgia, unspecified: Secondary | ICD-10-CM | POA: Diagnosis not present

## 2021-02-24 DIAGNOSIS — I890 Lymphedema, not elsewhere classified: Secondary | ICD-10-CM | POA: Diagnosis not present

## 2021-02-24 DIAGNOSIS — I130 Hypertensive heart and chronic kidney disease with heart failure and stage 1 through stage 4 chronic kidney disease, or unspecified chronic kidney disease: Secondary | ICD-10-CM | POA: Diagnosis not present

## 2021-02-24 DIAGNOSIS — Z792 Long term (current) use of antibiotics: Secondary | ICD-10-CM | POA: Diagnosis not present

## 2021-02-24 DIAGNOSIS — E1122 Type 2 diabetes mellitus with diabetic chronic kidney disease: Secondary | ICD-10-CM | POA: Diagnosis not present

## 2021-02-24 DIAGNOSIS — E7800 Pure hypercholesterolemia, unspecified: Secondary | ICD-10-CM | POA: Diagnosis not present

## 2021-02-26 ENCOUNTER — Encounter: Payer: Self-pay | Admitting: Family Medicine

## 2021-02-26 DIAGNOSIS — L720 Epidermal cyst: Secondary | ICD-10-CM | POA: Diagnosis not present

## 2021-02-26 DIAGNOSIS — I130 Hypertensive heart and chronic kidney disease with heart failure and stage 1 through stage 4 chronic kidney disease, or unspecified chronic kidney disease: Secondary | ICD-10-CM | POA: Diagnosis not present

## 2021-02-26 DIAGNOSIS — E1122 Type 2 diabetes mellitus with diabetic chronic kidney disease: Secondary | ICD-10-CM | POA: Diagnosis not present

## 2021-02-26 DIAGNOSIS — I5032 Chronic diastolic (congestive) heart failure: Secondary | ICD-10-CM | POA: Diagnosis not present

## 2021-02-26 DIAGNOSIS — K51518 Left sided colitis with other complication: Secondary | ICD-10-CM | POA: Diagnosis not present

## 2021-02-26 DIAGNOSIS — R532 Functional quadriplegia: Secondary | ICD-10-CM | POA: Diagnosis not present

## 2021-02-27 DIAGNOSIS — I130 Hypertensive heart and chronic kidney disease with heart failure and stage 1 through stage 4 chronic kidney disease, or unspecified chronic kidney disease: Secondary | ICD-10-CM | POA: Diagnosis not present

## 2021-02-27 DIAGNOSIS — E1122 Type 2 diabetes mellitus with diabetic chronic kidney disease: Secondary | ICD-10-CM | POA: Diagnosis not present

## 2021-02-27 DIAGNOSIS — K51518 Left sided colitis with other complication: Secondary | ICD-10-CM | POA: Diagnosis not present

## 2021-02-27 DIAGNOSIS — L720 Epidermal cyst: Secondary | ICD-10-CM | POA: Diagnosis not present

## 2021-02-27 DIAGNOSIS — I5032 Chronic diastolic (congestive) heart failure: Secondary | ICD-10-CM | POA: Diagnosis not present

## 2021-02-27 DIAGNOSIS — R532 Functional quadriplegia: Secondary | ICD-10-CM | POA: Diagnosis not present

## 2021-02-28 DIAGNOSIS — R532 Functional quadriplegia: Secondary | ICD-10-CM | POA: Diagnosis not present

## 2021-02-28 DIAGNOSIS — E1122 Type 2 diabetes mellitus with diabetic chronic kidney disease: Secondary | ICD-10-CM | POA: Diagnosis not present

## 2021-02-28 DIAGNOSIS — L720 Epidermal cyst: Secondary | ICD-10-CM | POA: Diagnosis not present

## 2021-02-28 DIAGNOSIS — I5032 Chronic diastolic (congestive) heart failure: Secondary | ICD-10-CM | POA: Diagnosis not present

## 2021-02-28 DIAGNOSIS — K51518 Left sided colitis with other complication: Secondary | ICD-10-CM | POA: Diagnosis not present

## 2021-02-28 DIAGNOSIS — I130 Hypertensive heart and chronic kidney disease with heart failure and stage 1 through stage 4 chronic kidney disease, or unspecified chronic kidney disease: Secondary | ICD-10-CM | POA: Diagnosis not present

## 2021-03-06 DIAGNOSIS — K51518 Left sided colitis with other complication: Secondary | ICD-10-CM | POA: Diagnosis not present

## 2021-03-06 DIAGNOSIS — I130 Hypertensive heart and chronic kidney disease with heart failure and stage 1 through stage 4 chronic kidney disease, or unspecified chronic kidney disease: Secondary | ICD-10-CM | POA: Diagnosis not present

## 2021-03-06 DIAGNOSIS — E1122 Type 2 diabetes mellitus with diabetic chronic kidney disease: Secondary | ICD-10-CM | POA: Diagnosis not present

## 2021-03-06 DIAGNOSIS — R532 Functional quadriplegia: Secondary | ICD-10-CM | POA: Diagnosis not present

## 2021-03-06 DIAGNOSIS — L720 Epidermal cyst: Secondary | ICD-10-CM | POA: Diagnosis not present

## 2021-03-06 DIAGNOSIS — I5032 Chronic diastolic (congestive) heart failure: Secondary | ICD-10-CM | POA: Diagnosis not present

## 2021-03-07 DIAGNOSIS — K51518 Left sided colitis with other complication: Secondary | ICD-10-CM | POA: Diagnosis not present

## 2021-03-07 DIAGNOSIS — R532 Functional quadriplegia: Secondary | ICD-10-CM | POA: Diagnosis not present

## 2021-03-07 DIAGNOSIS — E1122 Type 2 diabetes mellitus with diabetic chronic kidney disease: Secondary | ICD-10-CM | POA: Diagnosis not present

## 2021-03-07 DIAGNOSIS — I130 Hypertensive heart and chronic kidney disease with heart failure and stage 1 through stage 4 chronic kidney disease, or unspecified chronic kidney disease: Secondary | ICD-10-CM | POA: Diagnosis not present

## 2021-03-07 DIAGNOSIS — I5032 Chronic diastolic (congestive) heart failure: Secondary | ICD-10-CM | POA: Diagnosis not present

## 2021-03-07 DIAGNOSIS — L720 Epidermal cyst: Secondary | ICD-10-CM | POA: Diagnosis not present

## 2021-03-11 ENCOUNTER — Other Ambulatory Visit: Payer: Self-pay

## 2021-03-11 ENCOUNTER — Ambulatory Visit (INDEPENDENT_AMBULATORY_CARE_PROVIDER_SITE_OTHER): Payer: Medicare Other | Admitting: Family Medicine

## 2021-03-11 ENCOUNTER — Encounter: Payer: Self-pay | Admitting: Family Medicine

## 2021-03-11 VITALS — BP 148/86 | HR 90 | Temp 97.7°F

## 2021-03-11 DIAGNOSIS — M48061 Spinal stenosis, lumbar region without neurogenic claudication: Secondary | ICD-10-CM

## 2021-03-11 DIAGNOSIS — M5416 Radiculopathy, lumbar region: Secondary | ICD-10-CM

## 2021-03-11 DIAGNOSIS — G8929 Other chronic pain: Secondary | ICD-10-CM

## 2021-03-11 DIAGNOSIS — Z23 Encounter for immunization: Secondary | ICD-10-CM

## 2021-03-11 DIAGNOSIS — I10 Essential (primary) hypertension: Secondary | ICD-10-CM

## 2021-03-11 DIAGNOSIS — M545 Low back pain, unspecified: Secondary | ICD-10-CM | POA: Diagnosis not present

## 2021-03-11 DIAGNOSIS — J8410 Pulmonary fibrosis, unspecified: Secondary | ICD-10-CM

## 2021-03-11 DIAGNOSIS — I5032 Chronic diastolic (congestive) heart failure: Secondary | ICD-10-CM | POA: Diagnosis not present

## 2021-03-11 MED ORDER — METOPROLOL SUCCINATE ER 25 MG PO TB24: 125.0000 mg | ORAL_TABLET | Freq: Every day | ORAL | 4 refills | Status: DC

## 2021-03-11 NOTE — Patient Instructions (Signed)
At today's visit we increased the dose of Toprol XL from 100 mg daily to 125 mg daily.  A new prescription was sent to your pharmacy for this.  Continue to monitor your blood pressure and notify clinic if it remains elevated.  See if you notice an improvement in your back pain with the changes we discussed and also with the topical cream.

## 2021-03-11 NOTE — Progress Notes (Signed)
Subjective:    Patient ID: [name], female    DOB: 1940/11/06, 80 y.o.   MRN: [id number]  Chief Complaint  Patient presents with   Follow-up    BP  Pt accompanied by her daughter   HPI Patient was seen today for f/u on HTN and back pain.  Pt noted bp has remained about the same.  Taking toprol XL 100 mg daily.  Pt not eating much salt and not adding salt to food.  Pt states low back pain has increased, 7-8/10 but will increase.  Seen by pain management.  On MS Contin 15 mg nightly and morphine immediate release 15 mg twice daily as needed.also on Cymbalta 60 mg daily has a h/o spinal stenosis.  Denies loss of bowel or bladder.  Past Medical History:  Diagnosis Date   Acute on chronic diastolic (congestive) heart failure (HCC)    Anemia    iron deficiency anemia - secondary to blood loss ( chronic)    Anxiety    Arthritis    endstage changes bilateral knees/bilateral ankles.    Asthma    Carotid artery occlusion    Chronic fatigue    Chronic kidney disease    Closed left hip fracture (HCC)    Clotting disorder ([place])    pt denies this   Contusion of left knee    due to fall 1/14.   COPD (chronic obstructive pulmonary disease) (HCC)    pulmonary fibrosis   Depression, reactive    Diabetes mellitus    type II    Diastolic dysfunction    Difficulty in walking    Family history of heart disease    Generalized muscle weakness    Gout    High cholesterol    History of falling    Hypertension    Hypothyroidism    Interstitial lung disease ([place])    Meningioma of left sphenoid wing involving cavernous sinus ([place]) 02/17/2012   Continue diplopia, left eye pain and left headaches.     Morbid obesity ([place])    Morbid obesity ([place])    Neuromuscular disorder ([place])    diabetic neuropathy    Normal coronary arteries    cardiac catheterization performed  10/31/14   Pneumonia    RA (rheumatoid arthritis) ([place])    has been off methotreaxte since 10/13.   Rheumatoid arthritis  (HCC)    Shortness of breath    Spinal stenosis of lumbar region    Thyroid disease    Unspecified lack of coordination    URI (upper respiratory infection)     Allergies  Allergen Reactions   Codeine Swelling and Other (See Comments)    Facial swelling Chest pain and swelling in legs  Facial swelling   Infliximab Anaphylaxis    "sent me into shock" "sent me into shock"   Lisinopril Swelling    Face and neck swelling Face and neck swelling    ROS General: Denies fever, chills, night sweats, changes in weight, changes in appetite HEENT: Denies headaches, ear pain, changes in vision, rhinorrhea, sore throat CV: Denies CP, palpitations, SOB, orthopnea Pulm: Denies SOB, cough, wheezing GI: Denies abdominal pain, nausea, vomiting, diarrhea, constipation GU: Denies dysuria, hematuria, frequency, vaginal discharge Msk: Denies muscle cramps, joint pains +low back pain Neuro: Denies weakness, numbness, tingling Skin: Denies rashes, bruising Psych: Denies depression, anxiety, hallucinations     Objective:    Blood pressure (!) 148/86, pulse 90, temperature 97.7 F (36.5 C), temperature source Oral, SpO2 92 %.  Gen. Pleasant, well-nourished, in no distress, normal affect   HEENT: [place]/AT, face symmetric, conjunctiva clear, no scleral icterus, PERRLA, EOMI, nares patent without drainage Lungs: no accessory muscle use, CTAB, no wheezes or rales Cardiovascular: RRR, no m/r/g, trace edema in bilateral LEs at ankles. Abdomen: BS present, soft, NT/ND, no hepatosplenomegaly. Musculoskeletal: TTP of low back bilaterally and lower flank.  No deformities, no cyanosis or clubbing, normal tone Neuro:  A&Ox3, CN II-XII intact,siting in transport wheelchair. Skin:  Warm, no lesions/ rash   Wt Readings from Last 3 Encounters:  02/13/21 231 lb 12.8 oz (105.1 kg)  02/05/21 230 lb 8 oz (104.6 kg)  01/20/21 250 lb (113.4 kg)    Lab Results  Component Value Date   WBC 7.1 01/30/2021   HGB  12.3 01/30/2021   HCT 39.2 01/30/2021   PLT 212.0 01/30/2021   GLUCOSE 185 (H) 01/24/2021   CHOL 136 09/26/2020   TRIG 283.0 (H) 09/26/2020   HDL 40.00 09/26/2020   LDLDIRECT 59.0 09/26/2020   LDLCALC 75 09/22/2019   ALT 41 (H) 01/24/2021   AST 23 01/24/2021   NA 141 01/24/2021   K 3.7 01/24/2021   CL 101 01/24/2021   CREATININE 0.78 01/24/2021   BUN 11 01/24/2021   CO2 31 01/24/2021   TSH 4.598 (H) 01/16/2021   INR 1.0 01/16/2021   HGBA1C 5.6 09/26/2020    Assessment/Plan:  Essential hypertension  -Elevated -We will increase Toprol-XL from 100 mg daily to 125 mg daily -Continue lifestyle modifications -Patient encouraged to check BP at home and keep a log to bring with you to clinic - Plan: metoprolol succinate (TOPROL-XL) 25 MG 24 hr tablet  Spinal stenosis of lumbar region with radiculopathy -MR lumbar spine without contrast from 12/27/2019 with moderate spinal stenosis at L1-2 and L2-3, moderate to severe spinal stenosis at L3-4 and severe spinal stenosis at L4-5.  Large extruded disc fragment within the left L2 lateral recess exerting mass-effect on the thecal sac and likely results in left-sided nerve root encroachment.  Progressive facet disease  Chronic bilateral low back pain without sciatica -Continue MS Contin 15 mg nightly, morphine IR 15 mg twice daily as needed, Cymbalta 60 mg daily -Discussed other supportive care including topical analgesics, stretching, heat -Continue follow-up with pain management for adjustment of narcotic medications  Need for influenza vaccination -High-dose influenza vaccine given this visit  Chronic diastolic CHF (congestive heart failure) (HCC)  -We will increase Toprol-XL from 100 mg daily to 125 mg daily -Continue Lasix 80 mg daily - Plan: metoprolol succinate (TOPROL-XL) 25 MG 24 hr tablet  Pulmonary fibrosis (HCC) -stable -Continue current inhalers -Continue follow-up with pulmonology  F/u prn in 4-6 wks, sooner if  needed  Grier Mitts, MD

## 2021-03-12 ENCOUNTER — Telehealth: Payer: Self-pay | Admitting: Nurse Practitioner

## 2021-03-12 ENCOUNTER — Other Ambulatory Visit: Payer: Medicare Other | Admitting: Nurse Practitioner

## 2021-03-12 DIAGNOSIS — I130 Hypertensive heart and chronic kidney disease with heart failure and stage 1 through stage 4 chronic kidney disease, or unspecified chronic kidney disease: Secondary | ICD-10-CM | POA: Diagnosis not present

## 2021-03-12 DIAGNOSIS — I5032 Chronic diastolic (congestive) heart failure: Secondary | ICD-10-CM | POA: Diagnosis not present

## 2021-03-12 DIAGNOSIS — R532 Functional quadriplegia: Secondary | ICD-10-CM | POA: Diagnosis not present

## 2021-03-12 DIAGNOSIS — K51518 Left sided colitis with other complication: Secondary | ICD-10-CM | POA: Diagnosis not present

## 2021-03-12 DIAGNOSIS — L720 Epidermal cyst: Secondary | ICD-10-CM | POA: Diagnosis not present

## 2021-03-12 DIAGNOSIS — E1122 Type 2 diabetes mellitus with diabetic chronic kidney disease: Secondary | ICD-10-CM | POA: Diagnosis not present

## 2021-03-12 NOTE — Telephone Encounter (Signed)
I called Ms. Doswell for scheduled telephonic telemedicine f/u pc visit, no answer, unable to leave a message. Will continue to attempt to contact to reschedule

## 2021-03-13 DIAGNOSIS — L720 Epidermal cyst: Secondary | ICD-10-CM | POA: Diagnosis not present

## 2021-03-13 DIAGNOSIS — K51518 Left sided colitis with other complication: Secondary | ICD-10-CM | POA: Diagnosis not present

## 2021-03-13 DIAGNOSIS — E1122 Type 2 diabetes mellitus with diabetic chronic kidney disease: Secondary | ICD-10-CM | POA: Diagnosis not present

## 2021-03-13 DIAGNOSIS — R532 Functional quadriplegia: Secondary | ICD-10-CM | POA: Diagnosis not present

## 2021-03-13 DIAGNOSIS — I130 Hypertensive heart and chronic kidney disease with heart failure and stage 1 through stage 4 chronic kidney disease, or unspecified chronic kidney disease: Secondary | ICD-10-CM | POA: Diagnosis not present

## 2021-03-13 DIAGNOSIS — I5032 Chronic diastolic (congestive) heart failure: Secondary | ICD-10-CM | POA: Diagnosis not present

## 2021-03-14 DIAGNOSIS — R532 Functional quadriplegia: Secondary | ICD-10-CM | POA: Diagnosis not present

## 2021-03-14 DIAGNOSIS — E1122 Type 2 diabetes mellitus with diabetic chronic kidney disease: Secondary | ICD-10-CM | POA: Diagnosis not present

## 2021-03-14 DIAGNOSIS — I5032 Chronic diastolic (congestive) heart failure: Secondary | ICD-10-CM | POA: Diagnosis not present

## 2021-03-14 DIAGNOSIS — I130 Hypertensive heart and chronic kidney disease with heart failure and stage 1 through stage 4 chronic kidney disease, or unspecified chronic kidney disease: Secondary | ICD-10-CM | POA: Diagnosis not present

## 2021-03-14 DIAGNOSIS — L720 Epidermal cyst: Secondary | ICD-10-CM | POA: Diagnosis not present

## 2021-03-14 DIAGNOSIS — K51518 Left sided colitis with other complication: Secondary | ICD-10-CM | POA: Diagnosis not present

## 2021-03-19 ENCOUNTER — Telehealth: Payer: Self-pay

## 2021-03-19 MED ORDER — MORPHINE SULFATE ER 15 MG PO TBCR: 15.0000 mg | EXTENDED_RELEASE_TABLET | Freq: Two times a day (BID) | ORAL | 0 refills | Status: DC

## 2021-03-19 NOTE — Telephone Encounter (Signed)
PMP was Reviewed.  Morphine Sulfate ER e-scribed.  Placed a call to Ms. Hord, no answer.

## 2021-03-19 NOTE — Telephone Encounter (Signed)
02/14/2021 02/13/2021 1  Morphine Sulfate Ir 15 Mg Tab 60.00 30 Za Swa [id number] [facility] [phone] 0/0 30.00 MME Comm Ins [place]  02/14/2021 02/13/2021 1  Morphine Sulf Er 15 Mg Tablet 60.00 30 Za Swa [id number] [facility] ([phone]) 0/0 30.00 MME Comm [facility] [place]  [name] has maybe 8 or 9 tabs left of Morphine 12HR.    Can you send in refill?

## 2021-03-21 DIAGNOSIS — E1122 Type 2 diabetes mellitus with diabetic chronic kidney disease: Secondary | ICD-10-CM | POA: Diagnosis not present

## 2021-03-21 DIAGNOSIS — K51518 Left sided colitis with other complication: Secondary | ICD-10-CM | POA: Diagnosis not present

## 2021-03-21 DIAGNOSIS — I5032 Chronic diastolic (congestive) heart failure: Secondary | ICD-10-CM | POA: Diagnosis not present

## 2021-03-21 DIAGNOSIS — I130 Hypertensive heart and chronic kidney disease with heart failure and stage 1 through stage 4 chronic kidney disease, or unspecified chronic kidney disease: Secondary | ICD-10-CM | POA: Diagnosis not present

## 2021-03-21 DIAGNOSIS — R532 Functional quadriplegia: Secondary | ICD-10-CM | POA: Diagnosis not present

## 2021-03-21 DIAGNOSIS — L720 Epidermal cyst: Secondary | ICD-10-CM | POA: Diagnosis not present

## 2021-03-21 NOTE — Progress Notes (Signed)
Cardiology Office Note   Date:  03/22/2021   ID:  [name], DOB [birth year], MRN [id number]  PCP:  [name], MD  Cardiologist:  Dr. Gwenlyn Found  No chief complaint on file.    History of Present Illness: [name] is a 80 y.o. female who presents for follow-up with history of progressive dyspnea on exertion, with interstitial lung disease and rheumatoid arthritis.  She is on home oxygen.    She was seen by Dr. Gwenlyn Found for cardiovascular evaluation in the setting of chest discomfort and worsening dyspnea on exertion.  She did have a cardiac catheterization on 10/31/2014 by Dr.Varanosi which revealed  normal coronary arteries and normal LV function, with mild pulmonary hypertension.  Other history includes hypertension, dyslipidemia, chronic bilateral lower extremity edema, chronic diastolic heart failure and anemia.   She was admitted on 01/16/2021 through 01/20/2021, in the setting of sepsis due to left-sided colitis.  She also had acute metabolic encephalopathy related to sepsis.  She had also had completed a course of antibiotics for a cyst and abscess of her right breast.    She comes today with her daughter, sitting in a wheelchair, without cardiac complaints however, she continues to have a significant amount of pain in her back and hips and is being followed by pain management service.  She believes that this is affecting her blood pressure control.  She continues to be treated for rheumatoid arthritis.  Past Medical History:  Diagnosis Date   Acute on chronic diastolic (congestive) heart failure (HCC)    Anemia    iron deficiency anemia - secondary to blood loss ( chronic)    Anxiety    Arthritis    endstage changes bilateral knees/bilateral ankles.    Asthma    Carotid artery occlusion    Chronic fatigue    Chronic kidney disease    Closed left hip fracture (HCC)    Clotting disorder ([place])    pt denies this   Contusion of left knee    due to fall 1/14.   COPD  (chronic obstructive pulmonary disease) (HCC)    pulmonary fibrosis   Depression, reactive    Diabetes mellitus    type II    Diastolic dysfunction    Difficulty in walking    Family history of heart disease    Generalized muscle weakness    Gout    High cholesterol    History of falling    Hypertension    Hypothyroidism    Interstitial lung disease ([place])    Meningioma of left sphenoid wing involving cavernous sinus ([name]) 02/17/2012   Continue diplopia, left eye pain and left headaches.     Morbid obesity ([name])    Morbid obesity ([name])    Neuromuscular disorder ([name])    diabetic neuropathy    Normal coronary arteries    cardiac catheterization performed  10/31/14   Pneumonia    RA (rheumatoid arthritis) ([place])    has been off methotreaxte since 10/13.   Rheumatoid arthritis (HCC)    Shortness of breath    Spinal stenosis of lumbar region    Thyroid disease    Unspecified lack of coordination    URI (upper respiratory infection)     Past Surgical History:  Procedure Laterality Date   ABDOMINAL HYSTERECTOMY     BRAIN SURGERY     Gamma knife 10/13. Needs repeat spring  '14   CARDIAC CATHETERIZATION N/A 10/31/2014   Procedure: Right/Left Heart Cath and Coronary Angiography;  Surgeon: [name], MD;  Location: [facility];  Service: Cardiovascular;  Laterality: N/A;   ESOPHAGOGASTRODUODENOSCOPY (EGD) WITH PROPOFOL N/A 09/14/2014   Procedure: ESOPHAGOGASTRODUODENOSCOPY (EGD) WITH PROPOFOL;  Surgeon: [name], MD;  Location: [facility];  Service: Endoscopy;  Laterality: N/A;   INCISION AND DRAINAGE HIP Left 01/16/2017   Procedure: IRRIGATION AND DEBRIDEMENT LEFT HIP;  Surgeon: [name], MD;  Location: [facility];  Service: Orthopedics;  Laterality: Left;   INTRAMEDULLARY (IM) NAIL INTERTROCHANTERIC Left 11/29/2016   Procedure: INTRAMEDULLARY (IM) NAIL INTERTROCHANTRIC;  Surgeon: [name], MD;  Location: [facility];  Service: Orthopedics;   Laterality: Left;   OVARY SURGERY     SHOULDER SURGERY Left    TONSILLECTOMY  age 74   VIDEO BRONCHOSCOPY Bilateral 05/31/2013   Procedure: VIDEO BRONCHOSCOPY WITHOUT FLUORO;  Surgeon: Brand Males, MD;  Location: [facility];  Service: Cardiopulmonary;  Laterality: Bilateral;   video bronscoscopy  2000   lung     Current Outpatient Medications  Medication Sig Dispense Refill   acetaminophen (TYLENOL) 500 MG tablet Take 1,000 mg by mouth every 6 (six) hours as needed for moderate pain.      albuterol (PROVENTIL HFA;VENTOLIN HFA) 108 (90 BASE) MCG/ACT inhaler Inhale 2 puffs into the lungs every 6 (six) hours as needed for wheezing or shortness of breath.     allopurinol (ZYLOPRIM) 300 MG tablet Take 300 mg by mouth daily.     Artificial Tear Ointment (DRY EYES OP) Apply 1 drop to eye daily as needed (dry eyes).     aspirin buffered (BUFFERIN) 325 MG TABS tablet Take 325 mg by mouth daily.     atorvastatin (LIPITOR) 20 MG tablet Take 0.5 tablets (10 mg total) by mouth every evening. 90 tablet 2   azithromycin (ZITHROMAX) 250 MG tablet Take 2 tabs on day one.  Then take 1 tab daily on days 2-7. (Patient taking differently: Take 1 tablet by mouth Mon, Wed, and Fri) 8 tablet 0   Blood Pressure Monitoring (BLOOD PRESSURE MONITOR/ARM) DEVI To check blood pressure up to 3 times daily. 1 each 0   Calcium Carb-Cholecalciferol (CALCIUM + D3 PO) Take 2 tablets by mouth daily with lunch.      cholecalciferol (VITAMIN D) 1000 UNITS tablet Take 1,000 Units by mouth daily with lunch.     colchicine 0.6 MG tablet Take 0.6 mg by mouth daily as needed (gout flares).     cyclobenzaprine (FLEXERIL) 5 MG tablet Take 1 tablet (5 mg total) by mouth at bedtime as needed for muscle spasms. 90 tablet 3   diclofenac sodium (VOLTAREN) 1 % GEL Apply 2 g topically 3 (three) times daily. Both hands and knees 300 g 3   DULoxetine (CYMBALTA) 60 MG capsule Take 1 capsule (60 mg total) by mouth daily. 30 capsule 5    fluticasone (FLONASE) 50 MCG/ACT nasal spray Place 2 sprays into both nostrils daily. 16 g 3   furosemide (LASIX) 80 MG tablet Take 1 tablet (80 mg total) by mouth daily. 90 tablet 3   gabapentin (NEURONTIN) 600 MG tablet Take two in the morning , Two mid- afternoon and two at bedtime (Patient taking differently: Take 600 mg by mouth 3 (three) times daily.) 540 tablet 3   guaiFENesin (MUCINEX) 600 MG 12 hr tablet Take 1,200 mg by mouth 2 (two) times daily.     hydroxychloroquine (PLAQUENIL) 200 MG tablet Take 200 mg by mouth daily.     ipratropium-albuterol (DUONEB) 0.5-2.5 (3)  MG/3ML SOLN Take 3 mLs by nebulization every 6 (six) hours as needed (cough/wheezing).      leflunomide (ARAVA) 20 MG tablet Take 1 tablet (20 mg total) by mouth daily. 30 tablet 11   levothyroxine (SYNTHROID) 50 MCG tablet Take 1 tablet (50 mcg total) by mouth daily. 90 tablet 2   linaclotide (LINZESS) 72 MCG capsule Take 1 capsule (72 mcg total) by mouth daily before breakfast. 90 capsule 1   metolazone (ZAROXOLYN) 2.5 MG tablet TAKE 1 TABLET BY MOUTH DAILY AS NEEDED FOR 2 DAYS FOR WEIGHT ABOVE 255 POUNDS 90 tablet 1   metoprolol succinate (TOPROL-XL) 25 MG 24 hr tablet Take 5 tablets (125 mg total) by mouth daily. 150 tablet 4   morphine (MS CONTIN) 15 MG 12 hr tablet Take 1 tablet (15 mg total) by mouth every 12 (twelve) hours. 60 tablet 0   morphine (MSIR) 15 MG tablet One tablet twice a day as needed for break through pain. 60 tablet 0   Multiple Vitamin (MULTIVITAMIN WITH MINERALS) TABS Take 1 tablet by mouth daily.      olmesartan (BENICAR) 40 MG tablet Take 1 tablet (40 mg total) by mouth daily. 90 tablet 2   OXYGEN-HELIUM IN Inhale 2 L into the lungs at bedtime. And on exertion     pantoprazole (PROTONIX) 40 MG tablet take 1 tablet by mouth once daily MINUTES BEFORE 1ST MEAL OF THE DAY (Patient taking differently: Take 40 mg by mouth daily.) 90 tablet 2   potassium chloride SA (KLOR-CON) 20 MEQ tablet TAKE 1  TABLET(20 MEQ) BY MOUTH DAILY (Patient taking differently: Take 20 mEq by mouth daily.) 90 tablet 0   predniSONE (DELTASONE) 5 MG tablet Take 10 mg by mouth every morning.   0   Semaglutide,0.25 or 0.5MG/DOS, (OZEMPIC, 0.25 OR 0.5 MG/DOSE,) 2 MG/1.5ML SOPN Inject 0.5 mg into the skin once a week. (Patient taking differently: Inject 0.5 mg into the skin every Wednesday.) 3 mL 5   sodium chloride (OCEAN) 0.65 % SOLN nasal spray Place 1 spray into both nostrils as needed for congestion.     Spacer/Aero-Holding Chambers (AEROCHAMBER MV) inhaler Use as instructed 1 each 0   No current facility-administered medications for this visit.    Allergies:   Codeine, Infliximab, and Lisinopril    Social History:  The patient  reports that she has never smoked. She has never used smokeless tobacco. She reports that she does not drink alcohol and does not use drugs.   Family History:  The patient's family history includes Breast cancer in her sister and sister; Diabetes in her brother, mother, and sister; Esophageal cancer in her brother; Gout in her brother; Heart attack in her brother and mother; Heart disease in her brother; Heart murmur in her son; Hypertension in her brother, daughter, daughter, father, and son; Kidney cancer in her brother; Kidney failure in her brother; Lung cancer in her father; Prostate cancer in her brother; Rheum arthritis in her maternal uncle; Stomach cancer in her brother; Uterine cancer in her daughter.    ROS: All other systems are reviewed and negative. Unless otherwise mentioned in H&P    PHYSICAL EXAM: VS:  BP 139/90   Pulse 92   Ht _0  (1.626 m)   Wt 222 lb 9.6 oz (101 kg)   SpO2 95%   BMI 38.21 kg/m  , BMI Body mass index is 38.21 kg/m. GEN: Well nourished, well developed, in no acute distress, obese sitting in a wheelchair. HEENT: normal  Neck: no JVD, carotid bruits, or masses Cardiac: RRR; no murmurs, rubs, or gallops, mild, 1+ dependent edema  Respiratory:   Clear to auscultation bilaterally, normal work of breathing GI: soft, nontender, nondistended, + BS MS: no deformity or atrophy Skin: warm and dry, no rash Neuro:  Strength and sensation are intact Psych: euthymic mood, full affect   EKG:  EKG is not ordered today.    Recent Labs: 01/16/2021: TSH 4.598 01/17/2021: Magnesium 2.0 01/24/2021: ALT 41; BUN 11; Creatinine, Ser 0.78; Potassium 3.7; Sodium 141 01/30/2021: Hemoglobin 12.3; Platelets 212.0    Lipid Panel    Component Value Date/Time   CHOL 136 09/26/2020 1355   CHOL 150 05/26/2018 1259   TRIG 283.0 (H) 09/26/2020 1355   HDL 40.00 09/26/2020 1355   HDL 65 05/26/2018 1259   CHOLHDL 3 09/26/2020 1355   VLDL 56.6 (H) 09/26/2020 1355   LDLCALC 75 09/22/2019 1144   LDLCALC 63 05/26/2018 1259   LDLDIRECT 59.0 09/26/2020 1355      Wt Readings from Last 3 Encounters:  03/22/21 222 lb 9.6 oz (101 kg)  02/13/21 231 lb 12.8 oz (105.1 kg)  02/05/21 230 lb 8 oz (104.6 kg)      Other studies Reviewed: Echocardiogram 04/19/19 Left ventricle: The cavity size was normal. There was mild focal    basal hypertrophy of the septum. Systolic function was normal.    The estimated ejection fraction was in the range of 60% to 65%.    Wall motion was normal; there were no regional wall motion    abnormalities.  - Right ventricle: The cavity size was mildly dilated. Wall    thickness was normal.  - Pulmonary arteries: Systolic pressure was mildly increased.    ASSESSMENT AND PLAN:  1.  Essential hypertension: Currently well controlled today.  Will not make any changes in her medication regimen.  Some of it may also be related to chronic pain which does flare on occasion causing her blood pressure to rise.  Her blood pressure is normally lower at home.  No changes in her regimen  2.  Chronic diastolic CHF: No evidence of volume overload today.  She does have some mild dependent edema in her ankles, with some pain on palpation  pretibially.  She will continue medication regimen.  I have advised her to take her Lasix on an empty stomach so that she would have better response.  Labs are being followed by nephrologist and PCP.  3.  Dyslipidemia: Not currently on statin therapy at this time PCP is following labs.     Current medicines are reviewed at length with the patient today.  I have spent 25 minutes  dedicated to the care of this patient on the date of this encounter to include pre-visit review of records, assessment, management and diagnostic testing,with shared decision making.  Labs/ tests ordered today include: None, will review most recent labs from her primary care physician Dr. Harl Bowie.  Phill Myron. West Pugh, ANP, [facility], [place]   03/22/2021 10:24 AM    [facility] [place] [address] Office [phone] Fax [phone]  Notice: This dictation was prepared with Dragon dictation along with smaller phrase technology. Any transcriptional errors that result from this process are unintentional and may not be corrected upon review.

## 2021-03-22 ENCOUNTER — Other Ambulatory Visit: Payer: Self-pay

## 2021-03-22 ENCOUNTER — Encounter: Payer: Self-pay | Admitting: Adult Health

## 2021-03-22 ENCOUNTER — Ambulatory Visit (INDEPENDENT_AMBULATORY_CARE_PROVIDER_SITE_OTHER): Payer: Medicare Other | Admitting: Adult Health

## 2021-03-22 VITALS — BP 139/90 | HR 92 | Ht 64.0 in | Wt 222.6 lb

## 2021-03-22 DIAGNOSIS — E1142 Type 2 diabetes mellitus with diabetic polyneuropathy: Secondary | ICD-10-CM | POA: Diagnosis not present

## 2021-03-22 DIAGNOSIS — A419 Sepsis, unspecified organism: Secondary | ICD-10-CM | POA: Diagnosis not present

## 2021-03-22 DIAGNOSIS — E7800 Pure hypercholesterolemia, unspecified: Secondary | ICD-10-CM | POA: Diagnosis not present

## 2021-03-22 DIAGNOSIS — I5032 Chronic diastolic (congestive) heart failure: Secondary | ICD-10-CM | POA: Diagnosis not present

## 2021-03-22 DIAGNOSIS — N390 Urinary tract infection, site not specified: Secondary | ICD-10-CM

## 2021-03-22 DIAGNOSIS — I10 Essential (primary) hypertension: Secondary | ICD-10-CM

## 2021-03-22 NOTE — Patient Instructions (Signed)
Medication Instructions:  No change *If you need a refill on your cardiac medications before your next appointment, please call your pharmacy*   Lab Work: None Ordered   Testing/Procedures: None Ordered   Follow-Up: At [facility], you and your health needs are our priority.  As part of our continuing mission to provide you with exceptional heart care, we have created designated Provider Care Teams.  These Care Teams include your primary Cardiologist (physician) and Advanced Practice Providers (APPs -  Physician Assistants and Nurse Practitioners) who all work together to provide you with the care you need, when you need it.  We recommend signing up for the patient portal called "MyChart".  Sign up information is provided on this After Visit Summary.  MyChart is used to connect with patients for Virtual Visits (Telemedicine).  Patients are able to view lab/test results, encounter notes, upcoming appointments, etc.  Non-urgent messages can be sent to your provider as well.   To learn more about what you can do with MyChart, go to [email or web address].    Your next appointment:   6 month(s)  The format for your next appointment:   In Person  Provider:   Quay Burow, MD {

## 2021-03-26 DIAGNOSIS — M159 Polyosteoarthritis, unspecified: Secondary | ICD-10-CM | POA: Diagnosis not present

## 2021-03-26 DIAGNOSIS — J8410 Pulmonary fibrosis, unspecified: Secondary | ICD-10-CM | POA: Diagnosis not present

## 2021-03-26 DIAGNOSIS — I890 Lymphedema, not elsewhere classified: Secondary | ICD-10-CM | POA: Diagnosis not present

## 2021-03-26 DIAGNOSIS — L720 Epidermal cyst: Secondary | ICD-10-CM | POA: Diagnosis not present

## 2021-03-26 DIAGNOSIS — D62 Acute posthemorrhagic anemia: Secondary | ICD-10-CM | POA: Diagnosis not present

## 2021-03-26 DIAGNOSIS — I130 Hypertensive heart and chronic kidney disease with heart failure and stage 1 through stage 4 chronic kidney disease, or unspecified chronic kidney disease: Secondary | ICD-10-CM | POA: Diagnosis not present

## 2021-03-26 DIAGNOSIS — G894 Chronic pain syndrome: Secondary | ICD-10-CM | POA: Diagnosis not present

## 2021-03-26 DIAGNOSIS — F32A Depression, unspecified: Secondary | ICD-10-CM | POA: Diagnosis not present

## 2021-03-26 DIAGNOSIS — J479 Bronchiectasis, uncomplicated: Secondary | ICD-10-CM | POA: Diagnosis not present

## 2021-03-26 DIAGNOSIS — E039 Hypothyroidism, unspecified: Secondary | ICD-10-CM | POA: Diagnosis not present

## 2021-03-26 DIAGNOSIS — N189 Chronic kidney disease, unspecified: Secondary | ICD-10-CM | POA: Diagnosis not present

## 2021-03-26 DIAGNOSIS — Z9981 Dependence on supplemental oxygen: Secondary | ICD-10-CM | POA: Diagnosis not present

## 2021-03-26 DIAGNOSIS — J9611 Chronic respiratory failure with hypoxia: Secondary | ICD-10-CM | POA: Diagnosis not present

## 2021-03-26 DIAGNOSIS — E1122 Type 2 diabetes mellitus with diabetic chronic kidney disease: Secondary | ICD-10-CM | POA: Diagnosis not present

## 2021-03-26 DIAGNOSIS — I5032 Chronic diastolic (congestive) heart failure: Secondary | ICD-10-CM | POA: Diagnosis not present

## 2021-03-26 DIAGNOSIS — K51518 Left sided colitis with other complication: Secondary | ICD-10-CM | POA: Diagnosis not present

## 2021-03-26 DIAGNOSIS — M48061 Spinal stenosis, lumbar region without neurogenic claudication: Secondary | ICD-10-CM | POA: Diagnosis not present

## 2021-03-26 DIAGNOSIS — R532 Functional quadriplegia: Secondary | ICD-10-CM | POA: Diagnosis not present

## 2021-03-26 DIAGNOSIS — Z6841 Body Mass Index (BMI) 40.0 and over, adult: Secondary | ICD-10-CM | POA: Diagnosis not present

## 2021-03-26 DIAGNOSIS — M549 Dorsalgia, unspecified: Secondary | ICD-10-CM | POA: Diagnosis not present

## 2021-03-26 DIAGNOSIS — E1142 Type 2 diabetes mellitus with diabetic polyneuropathy: Secondary | ICD-10-CM | POA: Diagnosis not present

## 2021-03-26 DIAGNOSIS — E7800 Pure hypercholesterolemia, unspecified: Secondary | ICD-10-CM | POA: Diagnosis not present

## 2021-03-26 DIAGNOSIS — Z7985 Long-term (current) use of injectable non-insulin antidiabetic drugs: Secondary | ICD-10-CM | POA: Diagnosis not present

## 2021-03-26 DIAGNOSIS — M069 Rheumatoid arthritis, unspecified: Secondary | ICD-10-CM | POA: Diagnosis not present

## 2021-03-27 ENCOUNTER — Encounter: Payer: Self-pay | Admitting: Registered Nurse

## 2021-03-27 ENCOUNTER — Encounter: Payer: Medicare Other | Attending: Physical Medicine & Rehabilitation | Admitting: Registered Nurse

## 2021-03-27 ENCOUNTER — Other Ambulatory Visit: Payer: Self-pay

## 2021-03-27 VITALS — BP 135/79 | HR 97 | Temp 98.1°F | Ht 64.0 in | Wt 217.0 lb

## 2021-03-27 DIAGNOSIS — M545 Low back pain, unspecified: Secondary | ICD-10-CM | POA: Diagnosis not present

## 2021-03-27 DIAGNOSIS — M5416 Radiculopathy, lumbar region: Secondary | ICD-10-CM | POA: Insufficient documentation

## 2021-03-27 DIAGNOSIS — M7062 Trochanteric bursitis, left hip: Secondary | ICD-10-CM | POA: Diagnosis not present

## 2021-03-27 DIAGNOSIS — M170 Bilateral primary osteoarthritis of knee: Secondary | ICD-10-CM | POA: Insufficient documentation

## 2021-03-27 DIAGNOSIS — G8929 Other chronic pain: Secondary | ICD-10-CM | POA: Insufficient documentation

## 2021-03-27 DIAGNOSIS — M7061 Trochanteric bursitis, right hip: Secondary | ICD-10-CM | POA: Diagnosis not present

## 2021-03-27 DIAGNOSIS — Z79891 Long term (current) use of opiate analgesic: Secondary | ICD-10-CM | POA: Insufficient documentation

## 2021-03-27 DIAGNOSIS — Z5181 Encounter for therapeutic drug level monitoring: Secondary | ICD-10-CM | POA: Insufficient documentation

## 2021-03-27 DIAGNOSIS — G894 Chronic pain syndrome: Secondary | ICD-10-CM | POA: Insufficient documentation

## 2021-03-27 MED ORDER — MORPHINE SULFATE ER 15 MG PO TBCR: 15.0000 mg | EXTENDED_RELEASE_TABLET | Freq: Two times a day (BID) | ORAL | 0 refills | Status: DC

## 2021-03-27 NOTE — Progress Notes (Signed)
Subjective:    Patient ID: [name], female    DOB: [birth year], 80 y.o.   MRN: [id number]  HPI: [name] is a 80 y.o. female who returns for follow up appointment for chronic pain and medication refill. She states her pain is located in her lower back radiating into her bilateral lower extremities, right knee pain and generalized joint pain. [name] reports increase intensity of lower back pain, she denies falling and states she is only receiving 4 hours of relief of her pain when she takes her MSIR. Daughter [name] in room and states her mother  ( [name]) has been experiencing increase intensity of pain. Medication list was reviewed. Will order a Lumbar- X-Ray, she verbalizes understanding.  She rates her pain 8. Her current exercise regime is walking with her walker in the home. She arrived to office today in wheelchair.   Ms. [name] Morphine equivalent is 60.00 MME.   Last Oral Swab was Performed on 11/26/2020, it was consistent.      Pain Inventory Average Pain 8 Pain Right Now 8 My pain is constant, sharp, stabbing, and aching  In the last 24 hours, has pain interfered with the following? General activity 7 Relation with others 7 Enjoyment of life 7 What TIME of day is your pain at its worst? morning  Sleep (in general) Fair  Pain is worse with: walking, bending, standing, and some activites Pain improves with: rest, heat/ice, and medication Relief from Meds: 6  Family History  Problem Relation Age of Onset   Diabetes Mother    Heart attack Mother    Hypertension Father    Lung cancer Father    Diabetes Sister    Breast cancer Sister    Breast cancer Sister    Diabetes Brother    Hypertension Brother    Heart disease Brother    Heart attack Brother    Kidney cancer Brother    Gout Brother    Kidney failure Brother        x 5   Esophageal cancer Brother    Stomach cancer Brother    Prostate cancer Brother    Uterine cancer Daughter     Hypertension Daughter    Hypertension Daughter    Rheum arthritis Maternal Uncle    Hypertension Son    Heart murmur Son    Social History   Socioeconomic History   Marital status: Married    Spouse name: Not on file   Number of children: 6   Years of education: college   Highest education level: Not on file  Occupational History   Occupation: retired Marine scientist  Tobacco Use   Smoking status: Never   Smokeless tobacco: Never  Vaping Use   Vaping Use: Never used  Substance and Sexual Activity   Alcohol use: No   Drug use: No   Sexual activity: Not Currently  Other Topics Concern   Not on file  Social History Narrative   Patient consumes 2-3 cups coffee per day.   Social Determinants of Health   Financial Resource Strain: Low Risk    Difficulty of Paying Living Expenses: Not hard at all  Food Insecurity: No Food Insecurity   Worried About Charity fundraiser in the Last Year: Never true   [place] in the Last Year: Never true  Transportation Needs: No Transportation Needs   Lack of Transportation (Medical): No   Lack of Transportation (Non-Medical): No  Physical Activity: Not  on file  Stress: No Stress Concern Present   Feeling of Stress : Not at all  Social Connections: Moderately Integrated   Frequency of Communication with Friends and Family: Twice a week   Frequency of Social Gatherings with Friends and Family: Twice a week   Attends Religious Services: 1 to 4 times per year   Active Member of [employer] or Organizations: No   Attends Archivist Meetings: Never   Marital Status: Married   Past Surgical History:  Procedure Laterality Date   ABDOMINAL HYSTERECTOMY     BRAIN SURGERY     Gamma knife 10/13. Needs repeat spring  '14   CARDIAC CATHETERIZATION N/A 10/31/2014   Procedure: Right/Left Heart Cath and Coronary Angiography;  Surgeon: [name], MD;  Location: [facility];  Service: Cardiovascular;  Laterality: N/A;    ESOPHAGOGASTRODUODENOSCOPY (EGD) WITH PROPOFOL N/A 09/14/2014   Procedure: ESOPHAGOGASTRODUODENOSCOPY (EGD) WITH PROPOFOL;  Surgeon: [name], MD;  Location: [facility];  Service: Endoscopy;  Laterality: N/A;   INCISION AND DRAINAGE HIP Left 01/16/2017   Procedure: IRRIGATION AND DEBRIDEMENT LEFT HIP;  Surgeon: [name], MD;  Location: [facility];  Service: Orthopedics;  Laterality: Left;   INTRAMEDULLARY (IM) NAIL INTERTROCHANTERIC Left 11/29/2016   Procedure: INTRAMEDULLARY (IM) NAIL INTERTROCHANTRIC;  Surgeon: [name], MD;  Location: [facility];  Service: Orthopedics;  Laterality: Left;   OVARY SURGERY     SHOULDER SURGERY Left    TONSILLECTOMY  age 20   VIDEO BRONCHOSCOPY Bilateral 05/31/2013   Procedure: VIDEO BRONCHOSCOPY WITHOUT FLUORO;  Surgeon: Brand Males, MD;  Location: [facility];  Service: Cardiopulmonary;  Laterality: Bilateral;   video bronscoscopy  2000   lung   Past Surgical History:  Procedure Laterality Date   ABDOMINAL HYSTERECTOMY     BRAIN SURGERY     Gamma knife 10/13. Needs repeat spring  '14   CARDIAC CATHETERIZATION N/A 10/31/2014   Procedure: Right/Left Heart Cath and Coronary Angiography;  Surgeon: [name], MD;  Location: [facility];  Service: Cardiovascular;  Laterality: N/A;   ESOPHAGOGASTRODUODENOSCOPY (EGD) WITH PROPOFOL N/A 09/14/2014   Procedure: ESOPHAGOGASTRODUODENOSCOPY (EGD) WITH PROPOFOL;  Surgeon: [name], MD;  [facility]: [facility];  Service: Endoscopy;  Laterality: N/A;   INCISION AND DRAINAGE HIP Left 01/16/2017   Procedure: IRRIGATION AND DEBRIDEMENT LEFT HIP;  Surgeon: [name], MD;  Location: [facility];  Service: Orthopedics;  Laterality: Left;   INTRAMEDULLARY (IM) NAIL INTERTROCHANTERIC Left 11/29/2016   Procedure: INTRAMEDULLARY (IM) NAIL INTERTROCHANTRIC;  Surgeon: [name], MD;  Location: [facility];  Service: Orthopedics;  Laterality: Left;   OVARY SURGERY      SHOULDER SURGERY Left    TONSILLECTOMY  age 63   VIDEO BRONCHOSCOPY Bilateral 05/31/2013   Procedure: VIDEO BRONCHOSCOPY WITHOUT FLUORO;  Surgeon: Brand Males, MD;  Location: [facility];  Service: Cardiopulmonary;  Laterality: Bilateral;   video bronscoscopy  2000   lung   Past Medical History:  Diagnosis Date   Acute on chronic diastolic (congestive) heart failure (HCC)    Anemia    iron deficiency anemia - secondary to blood loss ( chronic)    Anxiety    Arthritis    endstage changes bilateral knees/bilateral ankles.    Asthma    Carotid artery occlusion    Chronic fatigue    Chronic kidney disease    Closed left hip fracture (HCC)    Clotting disorder ([place])    pt denies this  Contusion of left knee    due to fall 1/14.   COPD (chronic obstructive pulmonary disease) (HCC)    pulmonary fibrosis   Depression, reactive    Diabetes mellitus    type II    Diastolic dysfunction    Difficulty in walking    Family history of heart disease    Generalized muscle weakness    Gout    High cholesterol    History of falling    Hypertension    Hypothyroidism    Interstitial lung disease ([place])    Meningioma of left sphenoid wing involving cavernous sinus ([place]) 02/17/2012   Continue diplopia, left eye pain and left headaches.     Morbid obesity ([place])    Morbid obesity ([place])    Neuromuscular disorder ([place])    diabetic neuropathy    Normal coronary arteries    cardiac catheterization performed  10/31/14   Pneumonia    RA (rheumatoid arthritis) ([place])    has been off methotreaxte since 10/13.   Rheumatoid arthritis (HCC)    Shortness of breath    Spinal stenosis of lumbar region    Thyroid disease    Unspecified lack of coordination    URI (upper respiratory infection)    There were no vitals taken for this visit.  Opioid Risk Score:   Fall Risk Score:  `1  Depression screen PHQ 2/9  Depression screen [facility] 2/9 02/13/2021 11/26/2020 10/18/2020 10/18/2020 09/24/2020  12/14/2019 09/22/2019  Decreased Interest 1 0 0 0 _0 Down, Depressed, Hopeless 1 0 0 0 _1 PHQ - 2 Score 2 0 0 0 _2 Altered sleeping - - - - - - 2  Tired, decreased energy - - - - - - 2  Change in appetite - - - - - - 1  Feeling bad or failure about yourself  - - - - - - 1  Trouble concentrating - - - - - - 2  Moving slowly or fidgety/restless - - - - - - 1  Suicidal thoughts - - - - - - 0  PHQ-9 Score - - - - - - 12  Difficult doing work/chores - - - - - - Somewhat difficult  Some recent data might be hidden    Review of Systems  Musculoskeletal:  Positive for back pain and gait problem.       Right & left hip area pain  All other systems reviewed and are negative.     Objective:   Physical Exam Vitals and nursing note reviewed.  Constitutional:      Appearance: Normal appearance.  Cardiovascular:     Rate and Rhythm: Normal rate and regular rhythm.     Pulses: Normal pulses.     Heart sounds: Normal heart sounds.  Pulmonary:     Effort: Pulmonary effort is normal.     Breath sounds: Normal breath sounds.  Musculoskeletal:     Cervical back: Normal range of motion and neck supple.     Right lower leg: Edema present.     Left lower leg: Edema present.     Comments: Normal Muscle Bulk and Muscle Testing Reveals:  Upper Extremities: Full ROM and Muscle Strength 5/5 Lumbar Hypersensitivity Bilateral Greater Trochanter Tenderness: R>L Lower Extremities: Decreased ROM and Muscle Strength 5/5 Arrived in wheelchair     Skin:    General: Skin is warm and dry.  Neurological:     Mental Status: She is alert and  oriented to person, place, and time.  Psychiatric:        Mood and Affect: Mood normal.        Behavior: Behavior normal.         Assessment & Plan:  Acute Exacerbation of Chronic Low Back Pain: RX: Lumbar X-Ray.  2. Lumbar spinal stenosis with neurogenic claudication. Associated facet arthropathy/ Lumbar Radiculitis: Continue current medication regime  with Gabapentin and HEP as tolerated. Refilled: MS Contin 15 mg one tablet every 12 hours #60 and Increased  MSIR 15 one tablet three times a day as needed. Ms. Latchford daughter will send a My-Chart message on Friday 03/29/2021 with update on medication change, she verbalizes understanding.  We will continue the opioid monitoring program, this consists of regular clinic visits, examinations, urine drug screen, pill counts as well as use of [place] Controlled Substance Reporting system. A 12 month History has been reviewed on the [place] Controlled Substance Reporting System 03/28/2021. [name] Chronic Bilateral Thoracic Back Pain: Continue HEP as Tolerated. Continue to Monitor. 03/28/2021  Depression: Continue current medication Regime: Cymbalta . Continue to monitor.03/28/2021  Diabetes mellitus type 2 with polyneuropathy: Continue current medication regime: Gabapentin. 03/28/2021  Rheumatoid arthritis and osteoarthritis. Continue current medication Regime.  Voltaren Gel. Rheumatology Following. 03/28/2021 . Interstitial lung disease: Pulmonology Following. 03/28/2021. . Bilateral Osteoarthritis Knee's:  Continue current medication regimen. Voltaren Gel. 03/28/2021. . Muscle Spasm: Continue current medication regime : Flexeril. Continue to monitor.03/28/2021 . Bilateral Greater Trochanteric Tenderness: L>R. Continue current treatment with Ice/Heat Therapy. 03/28/2021. [name] Polyarthralgia: Rheumatology Following: Continue to monitor.03/28/2021  Morbid Obesitity: Continue Healthy Diet Regime and HEP. Continue to monitor.  03/28/2021 . Right Shoulder tendonitis: No complaints today..Continue to Alternate with Heat and Ice Therapy. Continue to monitor. 03/28/2021    F/U in 2 months

## 2021-03-28 DIAGNOSIS — K51518 Left sided colitis with other complication: Secondary | ICD-10-CM | POA: Diagnosis not present

## 2021-03-28 DIAGNOSIS — I5032 Chronic diastolic (congestive) heart failure: Secondary | ICD-10-CM | POA: Diagnosis not present

## 2021-03-28 DIAGNOSIS — E1122 Type 2 diabetes mellitus with diabetic chronic kidney disease: Secondary | ICD-10-CM | POA: Diagnosis not present

## 2021-03-28 DIAGNOSIS — R532 Functional quadriplegia: Secondary | ICD-10-CM | POA: Diagnosis not present

## 2021-03-28 DIAGNOSIS — L720 Epidermal cyst: Secondary | ICD-10-CM | POA: Diagnosis not present

## 2021-03-28 DIAGNOSIS — I130 Hypertensive heart and chronic kidney disease with heart failure and stage 1 through stage 4 chronic kidney disease, or unspecified chronic kidney disease: Secondary | ICD-10-CM | POA: Diagnosis not present

## 2021-03-29 ENCOUNTER — Ambulatory Visit: Payer: Medicare Other | Admitting: Adult Health

## 2021-03-30 ENCOUNTER — Emergency Department (HOSPITAL_COMMUNITY): Payer: Medicare Other

## 2021-03-30 ENCOUNTER — Inpatient Hospital Stay (HOSPITAL_COMMUNITY)
Admission: EM | Admit: 2021-03-30 | Discharge: 2021-04-03 | DRG: 871 | Disposition: A | Discharge status: Skilled Nursing Facility | Payer: Medicare Other | Attending: Student | Admitting: Student

## 2021-03-30 ENCOUNTER — Other Ambulatory Visit: Payer: Self-pay

## 2021-03-30 DIAGNOSIS — Z8049 Family history of malignant neoplasm of other genital organs: Secondary | ICD-10-CM

## 2021-03-30 DIAGNOSIS — Z833 Family history of diabetes mellitus: Secondary | ICD-10-CM

## 2021-03-30 DIAGNOSIS — R404 Transient alteration of awareness: Secondary | ICD-10-CM | POA: Diagnosis not present

## 2021-03-30 DIAGNOSIS — D696 Thrombocytopenia, unspecified: Secondary | ICD-10-CM

## 2021-03-30 DIAGNOSIS — E1165 Type 2 diabetes mellitus with hyperglycemia: Secondary | ICD-10-CM | POA: Diagnosis present

## 2021-03-30 DIAGNOSIS — N182 Chronic kidney disease, stage 2 (mild): Secondary | ICD-10-CM | POA: Diagnosis present

## 2021-03-30 DIAGNOSIS — Z20822 Contact with and (suspected) exposure to covid-19: Secondary | ICD-10-CM | POA: Diagnosis not present

## 2021-03-30 DIAGNOSIS — Z6837 Body mass index (BMI) 37.0-37.9, adult: Secondary | ICD-10-CM

## 2021-03-30 DIAGNOSIS — E785 Hyperlipidemia, unspecified: Secondary | ICD-10-CM | POA: Diagnosis present

## 2021-03-30 DIAGNOSIS — A4159 Other Gram-negative sepsis: Secondary | ICD-10-CM | POA: Diagnosis not present

## 2021-03-30 DIAGNOSIS — N390 Urinary tract infection, site not specified: Secondary | ICD-10-CM | POA: Diagnosis present

## 2021-03-30 DIAGNOSIS — Z800 Family history of malignant neoplasm of digestive organs: Secondary | ICD-10-CM

## 2021-03-30 DIAGNOSIS — F32A Depression, unspecified: Secondary | ICD-10-CM | POA: Diagnosis present

## 2021-03-30 DIAGNOSIS — I10 Essential (primary) hypertension: Secondary | ICD-10-CM | POA: Diagnosis present

## 2021-03-30 DIAGNOSIS — I5032 Chronic diastolic (congestive) heart failure: Secondary | ICD-10-CM | POA: Diagnosis present

## 2021-03-30 DIAGNOSIS — R6520 Severe sepsis without septic shock: Secondary | ICD-10-CM | POA: Diagnosis not present

## 2021-03-30 DIAGNOSIS — E8729 Other acidosis: Secondary | ICD-10-CM | POA: Diagnosis present

## 2021-03-30 DIAGNOSIS — M5416 Radiculopathy, lumbar region: Secondary | ICD-10-CM | POA: Diagnosis present

## 2021-03-30 DIAGNOSIS — R000 Tachycardia, unspecified: Secondary | ICD-10-CM | POA: Diagnosis not present

## 2021-03-30 DIAGNOSIS — Z885 Allergy status to narcotic agent status: Secondary | ICD-10-CM

## 2021-03-30 DIAGNOSIS — Z8042 Family history of malignant neoplasm of prostate: Secondary | ICD-10-CM

## 2021-03-30 DIAGNOSIS — Z8051 Family history of malignant neoplasm of kidney: Secondary | ICD-10-CM

## 2021-03-30 DIAGNOSIS — I5033 Acute on chronic diastolic (congestive) heart failure: Secondary | ICD-10-CM | POA: Diagnosis present

## 2021-03-30 DIAGNOSIS — E119 Type 2 diabetes mellitus without complications: Secondary | ICD-10-CM

## 2021-03-30 DIAGNOSIS — G8929 Other chronic pain: Secondary | ICD-10-CM | POA: Diagnosis present

## 2021-03-30 DIAGNOSIS — Z7985 Long-term (current) use of injectable non-insulin antidiabetic drugs: Secondary | ICD-10-CM

## 2021-03-30 DIAGNOSIS — J479 Bronchiectasis, uncomplicated: Secondary | ICD-10-CM | POA: Diagnosis present

## 2021-03-30 DIAGNOSIS — E861 Hypovolemia: Secondary | ICD-10-CM | POA: Diagnosis present

## 2021-03-30 DIAGNOSIS — J9611 Chronic respiratory failure with hypoxia: Secondary | ICD-10-CM | POA: Diagnosis present

## 2021-03-30 DIAGNOSIS — Z841 Family history of disorders of kidney and ureter: Secondary | ICD-10-CM

## 2021-03-30 DIAGNOSIS — Z888 Allergy status to other drugs, medicaments and biological substances status: Secondary | ICD-10-CM

## 2021-03-30 DIAGNOSIS — N179 Acute kidney failure, unspecified: Secondary | ICD-10-CM | POA: Diagnosis present

## 2021-03-30 DIAGNOSIS — Z8249 Family history of ischemic heart disease and other diseases of the circulatory system: Secondary | ICD-10-CM

## 2021-03-30 DIAGNOSIS — G9341 Metabolic encephalopathy: Secondary | ICD-10-CM | POA: Diagnosis present

## 2021-03-30 DIAGNOSIS — E039 Hypothyroidism, unspecified: Secondary | ICD-10-CM | POA: Diagnosis present

## 2021-03-30 DIAGNOSIS — R4182 Altered mental status, unspecified: Secondary | ICD-10-CM | POA: Diagnosis not present

## 2021-03-30 DIAGNOSIS — R0902 Hypoxemia: Secondary | ICD-10-CM | POA: Diagnosis not present

## 2021-03-30 DIAGNOSIS — I130 Hypertensive heart and chronic kidney disease with heart failure and stage 1 through stage 4 chronic kidney disease, or unspecified chronic kidney disease: Secondary | ICD-10-CM | POA: Diagnosis present

## 2021-03-30 DIAGNOSIS — E1140 Type 2 diabetes mellitus with diabetic neuropathy, unspecified: Secondary | ICD-10-CM | POA: Diagnosis present

## 2021-03-30 DIAGNOSIS — R531 Weakness: Secondary | ICD-10-CM | POA: Diagnosis not present

## 2021-03-30 DIAGNOSIS — M48061 Spinal stenosis, lumbar region without neurogenic claudication: Secondary | ICD-10-CM | POA: Diagnosis present

## 2021-03-30 DIAGNOSIS — Z801 Family history of malignant neoplasm of trachea, bronchus and lung: Secondary | ICD-10-CM

## 2021-03-30 DIAGNOSIS — R918 Other nonspecific abnormal finding of lung field: Secondary | ICD-10-CM | POA: Diagnosis not present

## 2021-03-30 DIAGNOSIS — Z86718 Personal history of other venous thrombosis and embolism: Secondary | ICD-10-CM

## 2021-03-30 DIAGNOSIS — Z7952 Long term (current) use of systemic steroids: Secondary | ICD-10-CM

## 2021-03-30 DIAGNOSIS — Z803 Family history of malignant neoplasm of breast: Secondary | ICD-10-CM

## 2021-03-30 DIAGNOSIS — Z79899 Other long term (current) drug therapy: Secondary | ICD-10-CM

## 2021-03-30 DIAGNOSIS — Z66 Do not resuscitate: Secondary | ICD-10-CM | POA: Diagnosis present

## 2021-03-30 DIAGNOSIS — M069 Rheumatoid arthritis, unspecified: Secondary | ICD-10-CM | POA: Diagnosis present

## 2021-03-30 DIAGNOSIS — Z79890 Hormone replacement therapy (postmenopausal): Secondary | ICD-10-CM

## 2021-03-30 DIAGNOSIS — A419 Sepsis, unspecified organism: Secondary | ICD-10-CM | POA: Diagnosis present

## 2021-03-30 DIAGNOSIS — Z9981 Dependence on supplemental oxygen: Secondary | ICD-10-CM

## 2021-03-30 DIAGNOSIS — L8990 Pressure ulcer of unspecified site, unspecified stage: Secondary | ICD-10-CM | POA: Insufficient documentation

## 2021-03-30 DIAGNOSIS — N189 Chronic kidney disease, unspecified: Secondary | ICD-10-CM | POA: Diagnosis present

## 2021-03-30 DIAGNOSIS — J849 Interstitial pulmonary disease, unspecified: Secondary | ICD-10-CM | POA: Diagnosis present

## 2021-03-30 DIAGNOSIS — G9340 Encephalopathy, unspecified: Secondary | ICD-10-CM

## 2021-03-30 DIAGNOSIS — G928 Other toxic encephalopathy: Secondary | ICD-10-CM | POA: Diagnosis present

## 2021-03-30 DIAGNOSIS — E876 Hypokalemia: Secondary | ICD-10-CM | POA: Diagnosis present

## 2021-03-30 DIAGNOSIS — E1122 Type 2 diabetes mellitus with diabetic chronic kidney disease: Secondary | ICD-10-CM | POA: Diagnosis present

## 2021-03-30 DIAGNOSIS — Z79891 Long term (current) use of opiate analgesic: Secondary | ICD-10-CM

## 2021-03-30 DIAGNOSIS — R410 Disorientation, unspecified: Secondary | ICD-10-CM | POA: Diagnosis not present

## 2021-03-30 DIAGNOSIS — T402X5A Adverse effect of other opioids, initial encounter: Secondary | ICD-10-CM | POA: Diagnosis present

## 2021-03-30 DIAGNOSIS — I959 Hypotension, unspecified: Secondary | ICD-10-CM | POA: Diagnosis not present

## 2021-03-30 DIAGNOSIS — Z86011 Personal history of benign neoplasm of the brain: Secondary | ICD-10-CM

## 2021-03-30 DIAGNOSIS — L89211 Pressure ulcer of right hip, stage 1: Secondary | ICD-10-CM | POA: Diagnosis present

## 2021-03-30 DIAGNOSIS — Z7982 Long term (current) use of aspirin: Secondary | ICD-10-CM

## 2021-03-30 HISTORY — DX: Encephalopathy, unspecified: G93.40

## 2021-03-30 LAB — I-STAT VENOUS BLOOD GAS, ED
Acid-Base Excess: 11 mmol/L — ABNORMAL HIGH (ref 0.0–2.0)
Bicarbonate: 38.6 mmol/L — ABNORMAL HIGH (ref 20.0–28.0)
Calcium, Ion: 0.95 mmol/L — ABNORMAL LOW (ref 1.15–1.40)
HCT: 38 % (ref 36.0–46.0)
Hemoglobin: 12.9 g/dL (ref 12.0–15.0)
O2 Saturation: 97 %
Potassium: 3.3 mmol/L — ABNORMAL LOW (ref 3.5–5.1)
Sodium: 142 mmol/L (ref 135–145)
TCO2: 41 mmol/L — ABNORMAL HIGH (ref 22–32)
pCO2, Ven: 66.5 mmHg — ABNORMAL HIGH (ref 44.0–60.0)
pH, Ven: 7.372 (ref 7.250–7.430)
pO2, Ven: 98 mmHg — ABNORMAL HIGH (ref 32.0–45.0)

## 2021-03-30 LAB — CBC WITH DIFFERENTIAL/PLATELET
Abs Immature Granulocytes: 0.04 10*3/uL (ref 0.00–0.07)
Basophils Absolute: 0 10*3/uL (ref 0.0–0.1)
Basophils Relative: 0 %
Eosinophils Absolute: 0.2 10*3/uL (ref 0.0–0.5)
Eosinophils Relative: 2 %
HCT: 40.4 % (ref 36.0–46.0)
Hemoglobin: 12.3 g/dL (ref 12.0–15.0)
Immature Granulocytes: 0 %
Lymphocytes Relative: 28 %
Lymphs Abs: 2.7 10*3/uL (ref 0.7–4.0)
MCH: 31.1 pg (ref 26.0–34.0)
MCHC: 30.4 g/dL (ref 30.0–36.0)
MCV: 102.3 fL — ABNORMAL HIGH (ref 80.0–100.0)
Monocytes Absolute: 1.1 10*3/uL — ABNORMAL HIGH (ref 0.1–1.0)
Monocytes Relative: 12 %
Neutro Abs: 5.4 10*3/uL (ref 1.7–7.7)
Neutrophils Relative %: 58 %
Platelets: 119 10*3/uL — ABNORMAL LOW (ref 150–400)
RBC: 3.95 MIL/uL (ref 3.87–5.11)
RDW: 14.7 % (ref 11.5–15.5)
WBC: 9.5 10*3/uL (ref 4.0–10.5)
nRBC: 0 % (ref 0.0–0.2)

## 2021-03-30 LAB — I-STAT ARTERIAL BLOOD GAS, ED
Acid-Base Excess: 8 mmol/L — ABNORMAL HIGH (ref 0.0–2.0)
Bicarbonate: 33.1 mmol/L — ABNORMAL HIGH (ref 20.0–28.0)
Calcium, Ion: 0.98 mmol/L — ABNORMAL LOW (ref 1.15–1.40)
HCT: 35 % — ABNORMAL LOW (ref 36.0–46.0)
Hemoglobin: 11.9 g/dL — ABNORMAL LOW (ref 12.0–15.0)
O2 Saturation: 94 %
Patient temperature: 98.6
Potassium: 3.2 mmol/L — ABNORMAL LOW (ref 3.5–5.1)
Sodium: 140 mmol/L (ref 135–145)
TCO2: 35 mmol/L — ABNORMAL HIGH (ref 22–32)
pCO2 arterial: 46.6 mmHg (ref 32.0–48.0)
pH, Arterial: 7.46 — ABNORMAL HIGH (ref 7.350–7.450)
pO2, Arterial: 70 mmHg — ABNORMAL LOW (ref 83.0–108.0)

## 2021-03-30 LAB — URINALYSIS, ROUTINE W REFLEX MICROSCOPIC
Bilirubin Urine: NEGATIVE
Glucose, UA: NEGATIVE mg/dL
Ketones, ur: NEGATIVE mg/dL
Nitrite: NEGATIVE
Protein, ur: 30 mg/dL — AB
Specific Gravity, Urine: 1.018 (ref 1.005–1.030)
pH: 5 (ref 5.0–8.0)

## 2021-03-30 LAB — RESP PANEL BY RT-PCR (FLU A&B, COVID) ARPGX2
Influenza A by PCR: NEGATIVE
Influenza B by PCR: NEGATIVE
SARS Coronavirus 2 by RT PCR: NEGATIVE

## 2021-03-30 LAB — COMPREHENSIVE METABOLIC PANEL
ALT: 22 U/L (ref 0–44)
AST: 20 U/L (ref 15–41)
Albumin: 3 g/dL — ABNORMAL LOW (ref 3.5–5.0)
Alkaline Phosphatase: 74 U/L (ref 38–126)
Anion gap: 11 (ref 5–15)
BUN: 17 mg/dL (ref 8–23)
CO2: 32 mmol/L (ref 22–32)
Calcium: 8.2 mg/dL — ABNORMAL LOW (ref 8.9–10.3)
Chloride: 100 mmol/L (ref 98–111)
Creatinine, Ser: 2.22 mg/dL — ABNORMAL HIGH (ref 0.44–1.00)
GFR, Estimated: 22 mL/min — ABNORMAL LOW (ref >60)
Glucose, Bld: 130 mg/dL — ABNORMAL HIGH (ref 70–99)
Potassium: 3.1 mmol/L — ABNORMAL LOW (ref 3.5–5.1)
Sodium: 143 mmol/L (ref 135–145)
Total Bilirubin: 0.8 mg/dL (ref 0.3–1.2)
Total Protein: 5.4 g/dL — ABNORMAL LOW (ref 6.5–8.1)

## 2021-03-30 LAB — APTT: aPTT: 26 seconds (ref 24–36)

## 2021-03-30 LAB — OSMOLALITY: Osmolality: 301 mOsm/kg — ABNORMAL HIGH (ref 275–295)

## 2021-03-30 LAB — BRAIN NATRIURETIC PEPTIDE: B Natriuretic Peptide: 10.1 pg/mL (ref 0.0–100.0)

## 2021-03-30 LAB — PROTIME-INR
INR: 1 (ref 0.8–1.2)
Prothrombin Time: 13.7 seconds (ref 11.4–15.2)

## 2021-03-30 LAB — GLUCOSE, CAPILLARY
Glucose-Capillary: 97 mg/dL (ref 70–99)
Glucose-Capillary: 108 mg/dL — ABNORMAL HIGH (ref 70–99)

## 2021-03-30 LAB — LACTIC ACID, PLASMA: Lactic Acid, Venous: 1.2 mmol/L (ref 0.5–1.9)

## 2021-03-30 LAB — CBG MONITORING, ED: Glucose-Capillary: 112 mg/dL — ABNORMAL HIGH (ref 70–99)

## 2021-03-30 LAB — MAGNESIUM: Magnesium: 1.6 mg/dL — ABNORMAL LOW (ref 1.7–2.4)

## 2021-03-30 LAB — AMMONIA: Ammonia: <10 umol/L (ref 9–35)

## 2021-03-30 MED ORDER — ACETAMINOPHEN 650 MG RE SUPP: 650.0000 mg | Freq: Four times a day (QID) | RECTAL | Status: DC | PRN

## 2021-03-30 MED ORDER — VITAMIN D 25 MCG (1000 UNIT) PO TABS
1000.0000 [IU] | ORAL_TABLET | Freq: Every day | ORAL | Status: DC
  Administered 2021-03-31 – 2021-04-03 (×4): 1000 [IU] via ORAL
  Filled 2021-03-30 (×5): qty 1

## 2021-03-30 MED ORDER — POTASSIUM CHLORIDE CRYS ER 10 MEQ PO TBCR: 20.0000 meq | EXTENDED_RELEASE_TABLET | Freq: Every day | ORAL | Status: DC

## 2021-03-30 MED ORDER — ACETAMINOPHEN 325 MG PO TABS: 650.0000 mg | ORAL_TABLET | Freq: Four times a day (QID) | ORAL | Status: DC | PRN

## 2021-03-30 MED ORDER — ATORVASTATIN CALCIUM 10 MG PO TABS
10.0000 mg | ORAL_TABLET | Freq: Every evening | ORAL | Status: DC
  Administered 2021-03-30 – 2021-04-02 (×4): 10 mg via ORAL
  Filled 2021-03-30 (×5): qty 1

## 2021-03-30 MED ORDER — ADULT MULTIVITAMIN W/MINERALS CH
1.0000 | ORAL_TABLET | Freq: Every day | ORAL | Status: DC
  Administered 2021-03-31 – 2021-04-03 (×4): 1 via ORAL
  Filled 2021-03-30 (×4): qty 1

## 2021-03-30 MED ORDER — LEFLUNOMIDE 20 MG PO TABS
20.0000 mg | ORAL_TABLET | Freq: Every day | ORAL | Status: DC
  Administered 2021-03-31 – 2021-04-03 (×4): 20 mg via ORAL
  Filled 2021-03-30 (×4): qty 1

## 2021-03-30 MED ORDER — HEPARIN SODIUM (PORCINE) 5000 UNIT/ML IJ SOLN
5000.0000 [IU] | Freq: Three times a day (TID) | INTRAMUSCULAR | Status: DC
  Administered 2021-03-30 – 2021-03-31 (×3): 5000 [IU] via SUBCUTANEOUS
  Filled 2021-03-30 (×3): qty 1

## 2021-03-30 MED ORDER — IOHEXOL 9 MG/ML PO SOLN
ORAL | Status: AC
  Administered 2021-03-30: 500 mL
  Filled 2021-03-30: qty 1000

## 2021-03-30 MED ORDER — IPRATROPIUM-ALBUTEROL 0.5-2.5 (3) MG/3ML IN SOLN: 3.0000 mL | Freq: Four times a day (QID) | RESPIRATORY_TRACT | Status: DC | PRN

## 2021-03-30 MED ORDER — LACTATED RINGERS IV BOLUS
500.0000 mL | Freq: Once | INTRAVENOUS | Status: AC
  Administered 2021-03-30: 500 mL via INTRAVENOUS

## 2021-03-30 MED ORDER — GUAIFENESIN ER 600 MG PO TB12
1200.0000 mg | ORAL_TABLET | Freq: Two times a day (BID) | ORAL | Status: DC
  Administered 2021-03-30 – 2021-04-03 (×7): 1200 mg via ORAL
  Filled 2021-03-30 (×8): qty 2

## 2021-03-30 MED ORDER — SODIUM CHLORIDE 0.9 % IV SOLN
2.0000 g | INTRAVENOUS | Status: DC
  Administered 2021-03-30 – 2021-04-01 (×3): 2 g via INTRAVENOUS
  Filled 2021-03-30 (×3): qty 20

## 2021-03-30 MED ORDER — DICLOFENAC SODIUM 1 % TD GEL: 2.0000 g | Freq: Three times a day (TID) | TRANSDERMAL | Status: DC

## 2021-03-30 MED ORDER — ALBUTEROL SULFATE (2.5 MG/3ML) 0.083% IN NEBU: 3.0000 mL | INHALATION_SOLUTION | Freq: Four times a day (QID) | RESPIRATORY_TRACT | Status: DC | PRN

## 2021-03-30 MED ORDER — ONDANSETRON HCL 4 MG PO TABS: 4.0000 mg | ORAL_TABLET | Freq: Four times a day (QID) | ORAL | Status: DC | PRN

## 2021-03-30 MED ORDER — ACETAMINOPHEN 325 MG PO TABS: 650.0000 mg | ORAL_TABLET | Freq: Once | ORAL | Status: DC

## 2021-03-30 MED ORDER — LACTATED RINGERS IV BOLUS
1000.0000 mL | Freq: Once | INTRAVENOUS | Status: AC
  Administered 2021-03-30: 1000 mL via INTRAVENOUS

## 2021-03-30 MED ORDER — ASPIRIN 325 MG PO TABS
325.0000 mg | ORAL_TABLET | Freq: Every day | ORAL | Status: DC
  Administered 2021-03-30 – 2021-04-03 (×5): 325 mg via ORAL
  Filled 2021-03-30 (×6): qty 1

## 2021-03-30 MED ORDER — DULOXETINE HCL 60 MG PO CPEP
60.0000 mg | ORAL_CAPSULE | Freq: Every day | ORAL | Status: DC
  Administered 2021-03-30: 60 mg via ORAL
  Filled 2021-03-30 (×3): qty 1

## 2021-03-30 MED ORDER — TETRACAINE HCL 0.5 % OP SOLN
2.0000 [drp] | Freq: Once | OPHTHALMIC | Status: AC
  Administered 2021-03-30: 2 [drp] via OPHTHALMIC
  Filled 2021-03-30: qty 4

## 2021-03-30 MED ORDER — PREDNISONE 10 MG PO TABS
10.0000 mg | ORAL_TABLET | Freq: Every morning | ORAL | Status: DC
  Administered 2021-03-31 – 2021-04-03 (×4): 10 mg via ORAL
  Filled 2021-03-30 (×5): qty 1

## 2021-03-30 MED ORDER — GABAPENTIN 600 MG PO TABS
600.0000 mg | ORAL_TABLET | Freq: Three times a day (TID) | ORAL | Status: DC
  Administered 2021-03-30 (×2): 600 mg via ORAL
  Filled 2021-03-30 (×2): qty 1

## 2021-03-30 MED ORDER — ALLOPURINOL 300 MG PO TABS
300.0000 mg | ORAL_TABLET | Freq: Every day | ORAL | Status: DC
  Filled 2021-03-30: qty 1

## 2021-03-30 MED ORDER — ACETAMINOPHEN 650 MG RE SUPP
650.0000 mg | Freq: Once | RECTAL | Status: AC
  Administered 2021-03-30: 650 mg via RECTAL
  Filled 2021-03-30: qty 1

## 2021-03-30 MED ORDER — NAPHAZOLINE-GLYCERIN 0.012-0.25 % OP SOLN
1.0000 [drp] | Freq: Four times a day (QID) | OPHTHALMIC | Status: DC | PRN
  Administered 2021-03-31: 2 [drp] via OPHTHALMIC
  Filled 2021-03-30: qty 15

## 2021-03-30 MED ORDER — FLUORESCEIN SODIUM 1 MG OP STRP
1.0000 | ORAL_STRIP | Freq: Once | OPHTHALMIC | Status: AC
  Administered 2021-03-30: 1 via OPHTHALMIC
  Filled 2021-03-30: qty 1

## 2021-03-30 MED ORDER — MAGNESIUM SULFATE 2 GM/50ML IV SOLN
2.0000 g | Freq: Once | INTRAVENOUS | Status: AC
  Administered 2021-03-30: 2 g via INTRAVENOUS
  Filled 2021-03-30: qty 50

## 2021-03-30 MED ORDER — DICLOFENAC SODIUM 1 % EX GEL
2.0000 g | Freq: Three times a day (TID) | CUTANEOUS | Status: DC
  Administered 2021-03-30 – 2021-04-03 (×10): 2 g via TOPICAL
  Filled 2021-03-30: qty 100

## 2021-03-30 MED ORDER — LEVOTHYROXINE SODIUM 50 MCG PO TABS
50.0000 ug | ORAL_TABLET | Freq: Every day | ORAL | Status: DC
  Administered 2021-03-31 – 2021-04-03 (×4): 50 ug via ORAL
  Filled 2021-03-30 (×4): qty 1

## 2021-03-30 MED ORDER — PANTOPRAZOLE SODIUM 40 MG PO TBEC
40.0000 mg | DELAYED_RELEASE_TABLET | Freq: Every day | ORAL | Status: DC
  Administered 2021-03-31 – 2021-04-03 (×4): 40 mg via ORAL
  Filled 2021-03-30 (×5): qty 1

## 2021-03-30 MED ORDER — MORPHINE SULFATE ER 15 MG PO TBCR
15.0000 mg | EXTENDED_RELEASE_TABLET | Freq: Two times a day (BID) | ORAL | Status: DC
  Administered 2021-03-30 – 2021-04-03 (×7): 15 mg via ORAL
  Filled 2021-03-30 (×8): qty 1

## 2021-03-30 MED ORDER — METOPROLOL TARTRATE 5 MG/5ML IV SOLN: 5.0000 mg | Freq: Four times a day (QID) | INTRAVENOUS | Status: DC | PRN

## 2021-03-30 MED ORDER — LACTATED RINGERS IV SOLN: INTRAVENOUS | Status: DC

## 2021-03-30 MED ORDER — POTASSIUM CHLORIDE CRYS ER 20 MEQ PO TBCR
40.0000 meq | EXTENDED_RELEASE_TABLET | Freq: Once | ORAL | Status: AC
  Administered 2021-03-30: 40 meq via ORAL
  Filled 2021-03-30: qty 2

## 2021-03-30 MED ORDER — FLUTICASONE PROPIONATE 50 MCG/ACT NA SUSP
2.0000 | Freq: Every day | NASAL | Status: DC
  Administered 2021-03-31 – 2021-04-03 (×4): 2 via NASAL
  Filled 2021-03-30: qty 16

## 2021-03-30 MED ORDER — INSULIN ASPART 100 UNIT/ML IJ SOLN
0.0000 [IU] | Freq: Three times a day (TID) | INTRAMUSCULAR | Status: DC
  Administered 2021-03-31: 1 [IU] via SUBCUTANEOUS
  Administered 2021-03-31 – 2021-04-01 (×2): 2 [IU] via SUBCUTANEOUS
  Administered 2021-04-01: 1 [IU] via SUBCUTANEOUS
  Administered 2021-04-02 (×2): 2 [IU] via SUBCUTANEOUS
  Administered 2021-04-02: 3 [IU] via SUBCUTANEOUS

## 2021-03-30 MED ORDER — ONDANSETRON HCL 4 MG/2ML IJ SOLN: 4.0000 mg | Freq: Four times a day (QID) | INTRAMUSCULAR | Status: DC | PRN

## 2021-03-30 NOTE — ED Notes (Signed)
ED TO INPATIENT HANDOFF REPORT  ED Nurse Name and Phone #: Romelle Starcher, RN   S Name/Age/Gender Trinidee Melton Krebs 80 y.o. female Room/Bed: 042C/042C  Code Status   Code Status: Prior  Home/SNF/Other Home Patient oriented to: self, place, time, and situation Is this baseline? Yes   Triage Complete: Triage complete  Chief Complaint Sepsis due to urinary tract infection ([place]) [A41.9, N39.0]  Triage Note Pt BIB [facility] from home d/t AMS. Per EMS, family found pt being confused and not getting up around 3am this morning. Pt's baseline is A&O X4,ambulates using a walker. Family also reported decreased po intake over the past few days. Pt appears nonverbal, only responds to voice and pain.   Ems VITALS  Ekg UNREMARKABLE.   BP 90/60  CBG 151    Allergies Allergies  Allergen Reactions   Codeine Swelling and Other (See Comments)    Facial swelling Chest pain and swelling in legs  Facial swelling   Infliximab Anaphylaxis    "sent me into shock" "sent me into shock"   Lisinopril Swelling    Face and neck swelling Face and neck swelling    Level of Care/Admitting Diagnosis ED Disposition     ED Disposition  Admit   Condition  --   [place]: [place] [[id number]]  Level of Care: Telemetry Medical [[id number]]  [facility] place patient in observation at [facility] or [place] if equivalent level of care is available:: Yes  Covid Evaluation: Confirmed COVID Negative  Diagnosis: Sepsis due to urinary tract infection [facility]) [[id number]]  Admitting Physician: Orma Flaming [[id number]]  Attending Physician: Orma Flaming [[id number]]          B Medical/Surgery History Past Medical History:  Diagnosis Date   Acute on chronic diastolic (congestive) heart failure (HCC)    Anemia    iron deficiency anemia - secondary to blood loss ( chronic)    Anxiety    Arthritis    endstage changes bilateral knees/bilateral ankles.    Asthma    Carotid artery occlusion     Chronic fatigue    Chronic kidney disease    Closed left hip fracture (HCC)    Clotting disorder ([place])    pt denies this   Contusion of left knee    due to fall 1/14.   COPD (chronic obstructive pulmonary disease) (HCC)    pulmonary fibrosis   Depression, reactive    Diabetes mellitus    type II    Diastolic dysfunction    Difficulty in walking    Family history of heart disease    Generalized muscle weakness    Gout    High cholesterol    History of falling    Hypertension    Hypothyroidism    Interstitial lung disease ([place])    Meningioma of left sphenoid wing involving cavernous sinus ([place]) 02/17/2012   Continue diplopia, left eye pain and left headaches.     Morbid obesity ([place])    Morbid obesity ([place])    Neuromuscular disorder ([place])    diabetic neuropathy    Normal coronary arteries    cardiac catheterization performed  10/31/14   Pneumonia    RA (rheumatoid arthritis) ([place])    has been off methotreaxte since 10/13.   Rheumatoid arthritis (HCC)    Shortness of breath    Spinal stenosis of lumbar region    Thyroid disease    Unspecified lack of coordination    URI (upper respiratory infection)  Past Surgical History:  Procedure Laterality Date   ABDOMINAL HYSTERECTOMY     BRAIN SURGERY     Gamma knife 10/13. Needs repeat spring  '14   CARDIAC CATHETERIZATION N/A 10/31/2014   Procedure: Right/Left Heart Cath and Coronary Angiography;  Surgeon: [name], MD;  Location: [facility];  Service: Cardiovascular;  Laterality: N/A;   ESOPHAGOGASTRODUODENOSCOPY (EGD) WITH PROPOFOL N/A 09/14/2014   Procedure: ESOPHAGOGASTRODUODENOSCOPY (EGD) WITH PROPOFOL;  Surgeon: [name], MD;  Location: [facility];  Service: Endoscopy;  Laterality: N/A;   INCISION AND DRAINAGE HIP Left 01/16/2017   Procedure: IRRIGATION AND DEBRIDEMENT LEFT HIP;  Surgeon: [name], MD;  Location: [facility];  Service: Orthopedics;  Laterality: Left;    INTRAMEDULLARY (IM) NAIL INTERTROCHANTERIC Left 11/29/2016   Procedure: INTRAMEDULLARY (IM) NAIL INTERTROCHANTRIC;  Surgeon: [name], MD;  Location: [facility];  Service: Orthopedics;  Laterality: Left;   OVARY SURGERY     SHOULDER SURGERY Left    TONSILLECTOMY  age 65   VIDEO BRONCHOSCOPY Bilateral 05/31/2013   Procedure: VIDEO BRONCHOSCOPY WITHOUT FLUORO;  Surgeon: Brand Males, MD;  Location: [facility];  Service: Cardiopulmonary;  Laterality: Bilateral;   video bronscoscopy  2000   lung     A IV Location/Drains/Wounds Patient Lines/Drains/Airways Status     Active Line/Drains/Airways     Name Placement date Placement time Site Days   Peripheral IV 03/30/21 20 G Right Antecubital 03/30/21  --  Antecubital  less than 1   Peripheral IV 03/30/21 20 G Left Antecubital 03/30/21  --  Antecubital  less than 1   Incision (Closed) 01/16/17 Hip Left 01/16/17  1535  -- 1534   Wound 09/15/11 Other (Comment) Right;Lower 2CM OPEN WOUND WITH MODERATE PUS DRAINAGE 09/15/11  1459  --  3484            Intake/Output Last 24 hours No intake or output data in the 24 hours ending 03/30/21 1535  Labs/Imaging Results for orders placed or performed during the hospital encounter of 03/30/21 (from the past 48 hour(s))  Resp Panel by RT-PCR (Flu A&B, Covid) Nasopharyngeal Swab     Status: None   Collection Time: 03/30/21 11:51 AM   Specimen: Nasopharyngeal Swab; Nasopharyngeal(NP) swabs in vial transport medium  Result Value Ref Range   SARS Coronavirus 2 by RT PCR NEGATIVE NEGATIVE    Comment: (NOTE) SARS-CoV-2 target nucleic acids are NOT DETECTED.  The SARS-CoV-2 RNA is generally detectable in upper respiratory specimens during the acute phase of infection. The lowest concentration of SARS-CoV-2 viral copies this assay can detect is 138 copies/mL. A negative result does not preclude SARS-Cov-2 infection and should not be used as the sole basis for treatment or other patient  management decisions. A negative result may occur with  improper specimen collection/handling, submission of specimen other than nasopharyngeal swab, presence of viral mutation(s) within the areas targeted by this assay, and inadequate number of viral copies(<138 copies/mL). A negative result must be combined with clinical observations, patient history, and epidemiological information. The expected result is Negative.  Fact Sheet for Patients:  EntrepreneurPulse.com.au  Fact Sheet for Healthcare Providers:  IncredibleEmployment.be  This test is no t yet approved or cleared by the [place] FDA and  has been authorized for detection and/or diagnosis of SARS-CoV-2 by FDA under an Emergency Use Authorization (EUA). This EUA will remain  in effect (meaning this test can be used) for the duration of the COVID-19 declaration under Section 564(b)(1) of the  Act, 21 U.S.C.section 360bbb-3(b)(1), unless the authorization is terminated  or revoked sooner.       Influenza A by PCR NEGATIVE NEGATIVE   Influenza B by PCR NEGATIVE NEGATIVE    Comment: (NOTE) The Xpert Xpress SARS-CoV-2/FLU/RSV plus assay is intended as an aid in the diagnosis of influenza from Nasopharyngeal swab specimens and should not be used as a sole basis for treatment. Nasal washings and aspirates are unacceptable for Xpert Xpress SARS-CoV-2/FLU/RSV testing.  Fact Sheet for Patients: EntrepreneurPulse.com.au  Fact Sheet for Healthcare Providers: IncredibleEmployment.be  This test is not yet approved or cleared by the [place] FDA and has been authorized for detection and/or diagnosis of SARS-CoV-2 by FDA under an Emergency Use Authorization (EUA). This EUA will remain in effect (meaning this test can be used) for the duration of the COVID-19 declaration under Section 564(b)(1) of the Act, 21 U.S.C. section 360bbb-3(b)(1), unless the  authorization is terminated or revoked.  Performed at [facility], [place] [address], [place], [place] [address]   Ammonia     Status: None   Collection Time: 03/30/21 11:52 AM  Result Value Ref Range   Ammonia <10 9 - 35 umol/L    Comment: Performed at [facility], [place] [address] [place], [place] [address]  Brain natriuretic peptide     Status: None   Collection Time: 03/30/21 11:58 AM  Result Value Ref Range   B Natriuretic Peptide 10.1 0.0 - 100.0 pg/mL    Comment: Performed at [facility] [address] [place], [place] [address]  Lactic acid, plasma     Status: None   Collection Time: 03/30/21 12:00 PM  Result Value Ref Range   Lactic Acid, Venous 1.2 0.5 - 1.9 mmol/L    Comment: Performed at [facility] [address], [place], [place] [address]  Comprehensive metabolic panel     Status: Abnormal   Collection Time: 03/30/21 12:00 PM  Result Value Ref Range   Sodium 143 135 - 145 mmol/L   Potassium 3.1 (L) 3.5 - 5.1 mmol/L   Chloride 100 98 - 111 mmol/L   CO2 32 22 - 32 mmol/L   Glucose, Bld 130 (H) 70 - 99 mg/dL    Comment: Glucose reference range applies only to samples taken after fasting for at least 8 hours.   BUN 17 8 - 23 mg/dL   Creatinine, Ser 2.22 (H) 0.44 - 1.00 mg/dL   Calcium 8.2 (L) 8.9 - 10.3 mg/dL   Total Protein 5.4 (L) 6.5 - 8.1 g/dL   Albumin 3.0 (L) 3.5 - 5.0 g/dL   AST 20 15 - 41 U/L   ALT 22 0 - 44 U/L   Alkaline Phosphatase 74 38 - 126 U/L   Total Bilirubin 0.8 0.3 - 1.2 mg/dL   GFR, Estimated 22 (L) >60 mL/min    Comment: (NOTE) Calculated using the CKD-EPI Creatinine Equation (2021)    Anion gap 11 5 - 15    Comment: Performed at [facility], [place] [address] [place], [place] [address]  CBC WITH DIFFERENTIAL     Status: Abnormal   Collection Time: 03/30/21 12:00 PM  Result Value Ref Range   WBC 9.5 4.0 - 10.5 K/uL   RBC 3.95 3.87 - 5.11 MIL/uL   Hemoglobin 12.3 12.0 - 15.0 g/dL   HCT 40.4 36.0 - 46.0 %    MCV 102.3 (H) 80.0 - 100.0 fL   MCH 31.1 26.0 -  34.0 pg   MCHC 30.4 30.0 - 36.0 g/dL   RDW 14.7 11.5 - 15.5 %   Platelets 119 (L) 150 - 400 K/uL    Comment: Immature Platelet Fraction may be clinically indicated, consider ordering this additional test [id number] REPEATED TO VERIFY PLATELET COUNT CONFIRMED BY SMEAR    nRBC 0.0 0.0 - 0.2 %   Neutrophils Relative % 58 %   Neutro Abs 5.4 1.7 - 7.7 K/uL   Lymphocytes Relative 28 %   Lymphs Abs 2.7 0.7 - 4.0 K/uL   Monocytes Relative 12 %   Monocytes Absolute 1.1 (H) 0.1 - 1.0 K/uL   Eosinophils Relative 2 %   Eosinophils Absolute 0.2 0.0 - 0.5 K/uL   Basophils Relative 0 %   Basophils Absolute 0.0 0.0 - 0.1 K/uL   Immature Granulocytes 0 %   Abs Immature Granulocytes 0.04 0.00 - 0.07 K/uL    Comment: Performed at [facility], [address], [place], [place] [address]  Protime-INR     Status: None   Collection Time: 03/30/21 12:00 PM  Result Value Ref Range   Prothrombin Time 13.7 11.4 - 15.2 seconds   INR 1.0 0.8 - 1.2    Comment: (NOTE) INR goal varies based on device and disease states. Performed at [facility], [place] [address], [place], [place] [address]   APTT     Status: None   Collection Time: 03/30/21 12:00 PM  Result Value Ref Range   aPTT 26 24 - 36 seconds    Comment: Performed at [facility] [address], [place], [place] [address]  Urinalysis, Routine w reflex microscopic     Status: Abnormal   Collection Time: 03/30/21 12:00 PM  Result Value Ref Range   Color, Urine AMBER (A) YELLOW    Comment: BIOCHEMICALS MAY BE AFFECTED BY COLOR   APPearance CLOUDY (A) CLEAR   Specific Gravity, Urine 1.018 1.005 - 1.030   pH 5.0 5.0 - 8.0   Glucose, UA NEGATIVE NEGATIVE mg/dL   Hgb urine dipstick SMALL (A) NEGATIVE   Bilirubin Urine NEGATIVE NEGATIVE   Ketones, ur NEGATIVE NEGATIVE mg/dL   Protein, ur 30 (A) NEGATIVE mg/dL   Nitrite NEGATIVE NEGATIVE   Leukocytes,Ua LARGE (A) NEGATIVE   RBC /  HPF 6-10 0 - 5 RBC/hpf   WBC, UA 21-50 0 - 5 WBC/hpf   Bacteria, UA RARE (A) NONE SEEN   Squamous Epithelial / LPF 0-5 0 - 5   Mucus PRESENT    Hyaline Casts, UA PRESENT     Comment: Performed at [facility], [address], [place], [place] [address]  POC CBG, ED     Status: Abnormal   Collection Time: 03/30/21 12:11 PM  Result Value Ref Range   Glucose-Capillary 112 (H) 70 - 99 mg/dL    Comment: Glucose reference range applies only to samples taken after fasting for at least 8 hours.  I-Stat venous blood gas, [facility] ED)     Status: Abnormal   Collection Time: 03/30/21 12:48 PM  Result Value Ref Range   pH, Ven 7.372 7.250 - 7.430   pCO2, Ven 66.5 (H) 44.0 - 60.0 mmHg   pO2, Ven 98.0 (H) 32.0 - 45.0 mmHg   Bicarbonate 38.6 (H) 20.0 - 28.0 mmol/L   TCO2 41 (H) 22 - 32 mmol/L   O2 Saturation 97.0 %   Acid-Base Excess 11.0 (H) 0.0 - 2.0 mmol/L   Sodium 142 135 -  145 mmol/L   Potassium 3.3 (L) 3.5 - 5.1 mmol/L   Calcium, Ion 0.95 (L) 1.15 - 1.40 mmol/L   HCT 38.0 36.0 - 46.0 %   Hemoglobin 12.9 12.0 - 15.0 g/dL   Sample type VENOUS    Comment NOTIFIED PHYSICIAN   Osmolality     Status: Abnormal   Collection Time: 03/30/21  1:36 PM  Result Value Ref Range   Osmolality 301 (H) 275 - 295 mOsm/kg    Comment: Performed at [facility], [place] [address], [place], [place] [address]  Magnesium     Status: Abnormal   Collection Time: 03/30/21  1:36 PM  Result Value Ref Range   Magnesium 1.6 (L) 1.7 - 2.4 mg/dL    Comment: Performed at [facility] [address] [place], [place] [address]   *Note: Due to a large number of results and/or encounters for the requested time period, some results have not been displayed. A complete set of results can be found in Results Review.   CT HEAD WO CONTRAST (5MM)  Result Date: 03/30/2021 CLINICAL DATA:  Mental status change EXAM: CT HEAD WITHOUT CONTRAST TECHNIQUE: Contiguous axial images were obtained from the base of the skull  through the vertex without intravenous contrast. COMPARISON:  Head CT dated January 16, 2021 FINDINGS: Brain: Chronic white matter ischemic change. No evidence of acute infarction, hemorrhage, hydrocephalus, extra-axial collection or mass lesion/mass effect. Vascular: No hyperdense vessel or unexpected calcification. Skull: Normal. Negative for fracture or focal lesion. Sinuses/Orbits: No acute finding. Other: None. IMPRESSION: No acute intracranial abnormality. Electronically Signed   By: Yetta Glassman M.D.   On: 03/30/2021 12:36   DG Chest Port 1 View  Result Date: 03/30/2021 CLINICAL DATA:  80 year old female with possible sepsis. EXAM: PORTABLE CHEST 1 VIEW COMPARISON:  Chest x-ray 01/16/2021. FINDINGS: Lung volumes are low. Widespread areas of interstitial prominence and diffuse peribronchial cuffing noted in the lungs bilaterally, most severe there out the mid to lower lungs. No definite confluent consolidative airspace disease. No pleural effusions. No pneumothorax. Crowding of the pulmonary vasculature. No evidence of pulmonary edema. Heart size is normal. Mediastinal contours are distorted by patient positioning. IMPRESSION: 1. The appearance the chest is very similar to the prior chest radiograph, suggestive of interstitial lung disease. Outpatient referral to Pulmonology for further clinical evaluation is recommended. Additionally, follow-up nonemergent high-resolution chest CT is recommended in the near future to better evaluate these findings. Electronically Signed   By: Vinnie Langton M.D.   On: 03/30/2021 12:27    Pending Labs Unresulted Labs (From admission, onward)     Start     Ordered   03/30/21 1518  Blood gas, arterial  Once,   R        03/30/21 1518   03/30/21 1151  Blood Culture (routine x 2)  (Undifferentiated presentation (screening labs and basic nursing orders))  BLOOD CULTURE X 2,   STAT      03/30/21 1151   03/30/21 1151  Urine Culture  (Undifferentiated presentation  (screening labs and basic nursing orders))  ONCE - STAT,   STAT       Question:  Indication  Answer:  Sepsis   03/30/21 1151            Vitals/Pain Today's Vitals   03/30/21 1149 03/30/21 1315 03/30/21 1415 03/30/21 1535  BP:  109/65 129/73   Pulse:  (!) 107 (!) 107   Resp:  15 14   Temp: (!) 100.5  F (38.1 C)     TempSrc: Rectal     SpO2:  95% 98%   PainSc:    3     Isolation Precautions No active isolations  Medications Medications  lactated ringers infusion ( Intravenous New Bag/Given 03/30/21 1358)  cefTRIAXone (ROCEPHIN) 2 g in sodium chloride 0.9 % 100 mL IVPB (0 g Intravenous Stopped 03/30/21 1434)  magnesium sulfate IVPB 2 g 50 mL (has no administration in time range)  acetaminophen (TYLENOL) suppository 650 mg (650 mg Rectal Given 03/30/21 1252)  lactated ringers bolus 1,000 mL (0 mLs Intravenous Stopped 03/30/21 1341)  potassium chloride SA (KLOR-CON) CR tablet 40 mEq (40 mEq Oral Given 03/30/21 1351)  tetracaine (PONTOCAINE) 0.5 % ophthalmic solution 2 drop (2 drops Both Eyes Given 03/30/21 1518)  fluorescein ophthalmic strip 1 strip (1 strip Both Eyes Given 03/30/21 1518)    Mobility walks with person assist     Focused Assessments Cardiac Assessment Handoff:  Cardiac Rhythm: Sinus tachycardia Lab Results  Component Value Date   CKTOTAL 57 07/12/2012   CKMB 1.2 01/28/2012   TROPONINI <0.03 06/02/2018   Lab Results  Component Value Date   DDIMER 0.39 02/10/2019   Does the Patient currently have chest pain? No   , Neuro Assessment Handoff:  Swallow screen pass? Yes  Cardiac Rhythm: Sinus tachycardia           R Recommendations: See Admitting Provider Note

## 2021-03-30 NOTE — Progress Notes (Signed)
[facility] following sepsis

## 2021-03-30 NOTE — ED Notes (Signed)
Patient transported to CT 

## 2021-03-30 NOTE — H&P (Addendum)
History and Physical    [name] AST:[id number] DOB: 1941/03/08 DOA: 03/30/2021  PCP: [name], MD Consultants:  cardiology: Dr. Gwenlyn Found, rheumatology: Dr. Estanislado Pandy, pulm: Dr. Pearline Cables and [name] and pain management Dr. Granville Lewis  Patient coming from:  Home - lives with family and her husband   Chief Complaint: altered mental status   HPI: [name] is a 80 y.o. female with medical history significant of type 2 diabetes, hypertension, dyslipidemia, RA on chronic prednisone, interstitial lung disease with dyspnea on exertion, bronchiectasis, chronic respiratory failure on home oxygen, type 2 diabetes with sensory neuropathy, chronic kidney disease stage III, chronic diastolic CHF, hypothyroidism, depression and recent admit on 01/16/21 for sepsis with left sided colitis who presented to ED with altered mental status.  Yesterday she was not her normal self. She was very sleepy and not"quite together"  per her daughter. She didn't really eat. This morning she woke up, but could not move her body well. She couldn't get out of the bed and she was acting confused. She couldn't bath herself and daughter had to bathe her. She didn't take her medicine this AM. She continued to be drowsy and then wouldn't really fully wake up and confusion got worse so  family called EMS around noon.  When she came to ED she was alert to painful stimuli only, but is now close to baseline, answering questions and telling history.   She has been cold at home, but no recorded fevers. No headaches, vision changes, chest pain or palpitations, shortness of breath or cough,  N/V, no dysuria, urgency or frequency. Decreased urinary output over past 3-4 days. Had some loose stool. No hx of kidney stones. She states her stomach has hurt since she had her bout with colitis, but seems to be worse.  She has pain in her epigastric area and lower right quadrant at times. Denies any blood in stool.   ED Course:  vitals: Temperature 100.5, blood pressure 119/72, heart rate 117, oxygen 99% on room air. Pertinent labs: Potassium 3.1, creatinine 2.22, platelets 119, MCV 102.3, magnesium 1.6 Blood cultures pending.  UA with 30 protein, large leuk, rare bacteria. Chest x-ray similar to prior imaging.  Suggest interstitial lung disease.  No acute findings.  CT head with no acute findings.  Code sepsis activated.  Given Rocephin for possible UTI.  Her LR bolus with maintenance fluids at 150 mL an hour.  Magnesium replaced.  Potassium repleted orally.  Appears eyes were checked for corneal abrasion.  TRH was asked to admit.  Review of Systems: As per HPI; otherwise review of systems reviewed and negative.   Ambulatory Status:  Ambulates with walker   Past Medical History:  Diagnosis Date   Acute on chronic diastolic (congestive) heart failure (HCC)    Anemia    iron deficiency anemia - secondary to blood loss ( chronic)    Anxiety    Arthritis    endstage changes bilateral knees/bilateral ankles.    Asthma    Carotid artery occlusion    Chronic fatigue    Chronic kidney disease    Closed left hip fracture (HCC)    Clotting disorder ([place])    pt denies this   Contusion of left knee    due to fall 1/14.   COPD (chronic obstructive pulmonary disease) (HCC)    pulmonary fibrosis   Depression, reactive    Diabetes mellitus    type II    Diastolic dysfunction    Difficulty  in walking    Family history of heart disease    Generalized muscle weakness    Gout    High cholesterol    History of falling    Hypertension    Hypothyroidism    Interstitial lung disease ([place])    Meningioma of left sphenoid wing involving cavernous sinus (HCC) 02/17/2012   Continue diplopia, left eye pain and left headaches.     Morbid obesity ([place])    Morbid obesity ([place])    Neuromuscular disorder ([place])    diabetic neuropathy    Normal coronary arteries    cardiac catheterization performed  10/31/14   Pneumonia    RA  (rheumatoid arthritis) ([place])    has been off methotreaxte since 10/13.   Rheumatoid arthritis (HCC)    Shortness of breath    Spinal stenosis of lumbar region    Thyroid disease    Unspecified lack of coordination    URI (upper respiratory infection)     Past Surgical History:  Procedure Laterality Date   ABDOMINAL HYSTERECTOMY     BRAIN SURGERY     Gamma knife 10/13. Needs repeat spring  '14   CARDIAC CATHETERIZATION N/A 10/31/2014   Procedure: Right/Left Heart Cath and Coronary Angiography;  Surgeon: [name], MD;  Location: [facility];  Service: Cardiovascular;  Laterality: N/A;   ESOPHAGOGASTRODUODENOSCOPY (EGD) WITH PROPOFOL N/A 09/14/2014   Procedure: ESOPHAGOGASTRODUODENOSCOPY (EGD) WITH PROPOFOL;  Surgeon: [name], MD;  Location: [facility];  Service: Endoscopy;  Laterality: N/A;   INCISION AND DRAINAGE HIP Left 01/16/2017   Procedure: IRRIGATION AND DEBRIDEMENT LEFT HIP;  Surgeon: [name], MD;  Location: [facility];  Service: Orthopedics;  Laterality: Left;   INTRAMEDULLARY (IM) NAIL INTERTROCHANTERIC Left 11/29/2016   Procedure: INTRAMEDULLARY (IM) NAIL INTERTROCHANTRIC;  Surgeon: [name], MD;  Location: [facility];  Service: Orthopedics;  Laterality: Left;   OVARY SURGERY     SHOULDER SURGERY Left    TONSILLECTOMY  age 50   VIDEO BRONCHOSCOPY Bilateral 05/31/2013   Procedure: VIDEO BRONCHOSCOPY WITHOUT FLUORO;  Surgeon: Brand Males, MD;  Location: [facility];  Service: Cardiopulmonary;  Laterality: Bilateral;   video bronscoscopy  2000   lung    Social History   Socioeconomic History   Marital status: Married    Spouse name: Not on file   Number of children: 6   Years of education: college   Highest education level: Not on file  Occupational History   Occupation: retired Marine scientist  Tobacco Use   Smoking status: Never   Smokeless tobacco: Never  Vaping Use   Vaping Use: Never used  Substance and Sexual Activity    Alcohol use: No   Drug use: No   Sexual activity: Not Currently  Other Topics Concern   Not on file  Social History Narrative   Patient consumes 2-3 cups coffee per day.   Social Determinants of Health   Financial Resource Strain: Low Risk    Difficulty of Paying Living Expenses: Not hard at all  Food Insecurity: No Food Insecurity   Worried About Charity fundraiser in the Last Year: Never true   [place] in the Last Year: Never true  Transportation Needs: No Transportation Needs   Lack of Transportation (Medical): No   Lack of Transportation (Non-Medical): No  Physical Activity: Not on file  Stress: No Stress Concern Present   Feeling of Stress : Not at all  Social Connections: Moderately Integrated  Frequency of Communication with Friends and Family: Twice a week   Frequency of Social Gatherings with Friends and Family: Twice a week   Attends Religious Services: 1 to 4 times per year   Active Member of [employer] or Organizations: No   Attends Music therapist: Never   Marital Status: Married  Human resources officer Violence: Not At Risk   Fear of Current or Ex-Partner: No   Emotionally Abused: No   Physically Abused: No   Sexually Abused: No    Allergies  Allergen Reactions   Codeine Swelling and Other (See Comments)    Facial swelling Chest pain and swelling in legs  Facial swelling   Infliximab Anaphylaxis    "sent me into shock" "sent me into shock"   Lisinopril Swelling    Face and neck swelling Face and neck swelling    Family History  Problem Relation Age of Onset   Diabetes Mother    Heart attack Mother    Hypertension Father    Lung cancer Father    Diabetes Sister    Breast cancer Sister    Breast cancer Sister    Diabetes Brother    Hypertension Brother    Heart disease Brother    Heart attack Brother    Kidney cancer Brother    Gout Brother    Kidney failure Brother        x 5   Esophageal cancer Brother    Stomach cancer  Brother    Prostate cancer Brother    Uterine cancer Daughter    Hypertension Daughter    Hypertension Daughter    Rheum arthritis Maternal Uncle    Hypertension Son    Heart murmur Son     Prior to Admission medications   Medication Sig Start Date End Date Taking? Authorizing Provider  acetaminophen (TYLENOL) 500 MG tablet Take 1,000 mg by mouth every 6 (six) hours as needed for moderate pain.    Yes [provider]  albuterol (PROVENTIL HFA;VENTOLIN HFA) 108 (90 BASE) MCG/ACT inhaler Inhale 2 puffs into the lungs every 6 (six) hours as needed for wheezing or shortness of breath.   Yes [provider]  allopurinol (ZYLOPRIM) 300 MG tablet Take 300 mg by mouth daily.   Yes [provider]  Artificial Tear Ointment (DRY EYES OP) Apply 1 drop to eye daily as needed (dry eyes).   Yes [provider]  aspirin buffered (BUFFERIN) 325 MG TABS tablet Take 325 mg by mouth daily.   Yes [provider]  atorvastatin (LIPITOR) 20 MG tablet Take 0.5 tablets (10 mg total) by mouth every evening. 09/26/20  Yes [name], MD  Calcium Carb-Cholecalciferol (CALCIUM + D3 PO) Take 2 tablets by mouth daily with lunch.    Yes [provider]  cholecalciferol (VITAMIN D) 1000 UNITS tablet Take 1,000 Units by mouth daily with lunch.   Yes [provider]  colchicine 0.6 MG tablet Take 0.6 mg by mouth daily as needed (gout flares). 01/17/21  Yes [provider]  cyclobenzaprine (FLEXERIL) 5 MG tablet Take 1 tablet (5 mg total) by mouth at bedtime as needed for muscle spasms. 12/14/19  Yes [name], NP  diclofenac sodium (VOLTAREN) 1 % GEL Apply 2 g topically 3 (three) times daily. Both hands and knees 02/08/19  Yes Danella Sensing L, NP  DULoxetine (CYMBALTA) 60 MG capsule Take 1 capsule (60 mg total) by mouth daily. 02/13/21  Yes Meredith Staggers, MD  fluticasone (  FLONASE) 50 MCG/ACT nasal spray Place 2 sprays into both nostrils daily.  06/14/20  Yes [name], MD  furosemide (LASIX) 80 MG tablet Take 1 tablet (80 mg total) by mouth daily. 11/22/20  Yes Lorretta Harp, MD  gabapentin (NEURONTIN) 600 MG tablet Take two in the morning , Two mid- afternoon and two at bedtime Patient taking differently: Take 600 mg by mouth 3 (three) times daily. 05/29/20  Yes [name], NP  guaiFENesin (MUCINEX) 600 MG 12 hr tablet Take 1,200 mg by mouth 2 (two) times daily.   Yes [provider]  hydroxychloroquine (PLAQUENIL) 200 MG tablet Take 200 mg by mouth daily.   Yes [provider]  ipratropium-albuterol (DUONEB) 0.5-2.5 (3) MG/3ML SOLN Take 3 mLs by nebulization every 6 (six) hours as needed (cough/wheezing).    Yes [provider]  leflunomide (ARAVA) 20 MG tablet Take 1 tablet (20 mg total) by mouth daily. 01/23/21 01/23/22 Yes Ghimire, Henreitta Leber, MD  levothyroxine (SYNTHROID) 50 MCG tablet Take 1 tablet (50 mcg total) by mouth daily. 09/26/20  Yes [name], MD  linaclotide Rolan Lipa) 72 MCG capsule Take 1 capsule (72 mcg total) by mouth daily before breakfast. 10/09/20  Yes [name], MD  metolazone (ZAROXOLYN) 2.5 MG tablet TAKE 1 TABLET BY MOUTH DAILY AS NEEDED FOR 2 DAYS FOR WEIGHT ABOVE 255 POUNDS Patient taking differently: Take 2.5 mg by mouth daily as needed (Take for 2 days for weight gain above 255 pounds). 03/30/20  Yes Lorretta Harp, MD  metoprolol succinate (TOPROL-XL) 25 MG 24 hr tablet Take 5 tablets (125 mg total) by mouth daily. 03/11/21  Yes [name], MD  morphine (MS CONTIN) 15 MG 12 hr tablet Take 1 tablet (15 mg total) by mouth every 12 (twelve) hours. Do Not Fill Before 04/15/2021 03/27/21  Yes [name], NP  Multiple Vitamin (MULTIVITAMIN WITH MINERALS) TABS Take 1 tablet by mouth daily.    Yes [provider]  olmesartan (BENICAR) 40 MG tablet Take 1 tablet (40 mg total) by mouth daily. 09/26/20  Yes [name], MD  OXYGEN-HELIUM IN  Inhale 2 L into the lungs at bedtime. And on exertion   Yes [provider]  pantoprazole (PROTONIX) 40 MG tablet take 1 tablet by mouth once daily [place] DAY Patient taking differently: Take 40 mg by mouth daily. 09/26/20  Yes [name], MD  Potassium Chloride ER 20 MEQ TBCR Take 20 mEq by mouth daily. 01/17/21  Yes [provider]  predniSONE (DELTASONE) 5 MG tablet Take 10 mg by mouth every morning.  06/20/15  Yes [provider]  Semaglutide,0.25 or 0.5MG/DOS, (OZEMPIC, 0.25 OR 0.5 MG/DOSE,) 2 MG/1.5ML SOPN Inject 0.25 mg as directed once a week. 01/17/21  Yes [provider]  sodium chloride (OCEAN) 0.65 % SOLN nasal spray Place 1 spray into both nostrils as needed for congestion.   Yes [provider]  Blood Pressure Monitoring (BLOOD PRESSURE MONITOR/ARM) DEVI To check blood pressure up to 3 times daily. 02/20/21   [name], MD  morphine (MSIR) 15 MG tablet One tablet twice a day as needed for break through pain. Patient not taking: Reported on 03/30/2021 02/13/21   Meredith Staggers, MD  Spacer/Aero-Holding Chambers (AEROCHAMBER MV) inhaler Use as instructed 06/10/13   Melvenia Needles, NP    Physical Exam: Vitals:   03/30/21 1138 03/30/21 1149 03/30/21 1315 03/30/21 1415  BP:  119/72  109/65 129/73  Pulse: (!) 117  (!) 107 (!) 107  Resp:   15 14  Temp:  (!) 100.5 F (38.1 C)    TempSrc:  Rectal    SpO2: 99%  95% 98%     General:  Appears calm and comfortable and is in NAD. Drowsy at times.  Eyes:  PERRL, EOMI, normal lids, iris. Left eye with mild scleral injection ENT:  grossly normal hearing, lips & tongue, mmm; appropriate dentition Neck:  no LAD, masses or thyromegaly; no carotid bruits Cardiovascular:  sinus tachycardia, no m/r/g. No LE edema.  Respiratory:   overall clear exam. No wheezing or crackles. Normal respiratory effort. Abdomen:  soft, TTP in epigastric and RLQ area. No rebound or  guarding. BS+ Back:   normal alignment, ttp along lower back (she states chronic) no CVA tenderness Skin:  no rash or induration seen on limited exam. Has a few pustules on her left great toe.  Musculoskeletal:  grossly normal tone BUE/BLE, good ROM, no bony abnormality Lower extremity:  bilateral LE edema. Equal.  Limited foot exam with no ulcerations.  2+ distal pulses. Psychiatric:  grossly normal mood and affect, speech fluent and appropriate, AOx3 Neurologic:  CN 2-12 grossly intact, moves all extremities in coordinated fashion, sensation intact    Radiological Exams on Admission: Independently reviewed - see discussion in A/P where applicable  CT HEAD WO CONTRAST (5MM)  Result Date: 03/30/2021 CLINICAL DATA:  Mental status change EXAM: CT HEAD WITHOUT CONTRAST TECHNIQUE: Contiguous axial images were obtained from the base of the skull through the vertex without intravenous contrast. COMPARISON:  Head CT dated January 16, 2021 FINDINGS: Brain: Chronic white matter ischemic change. No evidence of acute infarction, hemorrhage, hydrocephalus, extra-axial collection or mass lesion/mass effect. Vascular: No hyperdense vessel or unexpected calcification. Skull: Normal. Negative for fracture or focal lesion. Sinuses/Orbits: No acute finding. Other: None. IMPRESSION: No acute intracranial abnormality. Electronically Signed   By: Yetta Glassman M.D.   On: 03/30/2021 12:36   DG Chest Port 1 View  Result Date: 03/30/2021 CLINICAL DATA:  80 year old female with possible sepsis. EXAM: PORTABLE CHEST 1 VIEW COMPARISON:  Chest x-ray 01/16/2021. FINDINGS: Lung volumes are low. Widespread areas of interstitial prominence and diffuse peribronchial cuffing noted in the lungs bilaterally, most severe there out the mid to lower lungs. No definite confluent consolidative airspace disease. No pleural effusions. No pneumothorax. Crowding of the pulmonary vasculature. No evidence of pulmonary edema. Heart size is  normal. Mediastinal contours are distorted by patient positioning. IMPRESSION: 1. The appearance the chest is very similar to the prior chest radiograph, suggestive of interstitial lung disease. Outpatient referral to Pulmonology for further clinical evaluation is recommended. Additionally, follow-up nonemergent high-resolution chest CT is recommended in the near future to better evaluate these findings. Electronically Signed   By: Vinnie Langton M.D.   On: 03/30/2021 12:27    EKG: Independently reviewed.  Sinus tachycardia with rate 115, lower anterior fascicular block; nonspecific ST changes with no evidence of acute ischemia. Similar to previous EKG    Labs on Admission: I have personally reviewed the available labs and imaging studies at the time of the admission.  Pertinent labs:  Potassium 3.1,  creatinine 2.22, (.78) platelets 119,  MCV 102.3,  magnesium 1.6    Assessment/Plan Active Problems:   Sepsis due to urinary tract infection [facility]) -80 year old female with AMS and sepsis criteria with tachycardia, fever and urine that appears to be infected. She also has  complaints of vague abdominal pain and loose stool.  -admit to telemetry -was given 1L IVF in ED with maintenance at 150cc/hour. Still remains mildly tachycardic despite being afebrile. Will give her another 500cc bolus. Cautious IV resuscitation in setting of CHF, will decrease maintenance and time limited and continue to monitor. She has no pleuritic pain, worsening shortness of breath or cough. Continue to monitor.  -lactic acid wnl, WBC wnl  -continue rocephin, urine culture pending -blood cultures pending -CT abdo/pelvis has been ordered with abdominal pain/loose stool     Acute encephalopathy -resolved after IVF in ED -head CT negative, ammonia negative -secondary to UTI/sepsis. Treatment per above    Acute kidney injury superimposed on chronic kidney disease ([place]) -likely secondary to sepsis and poor PO intake.   -IVF  -intake/output -hold lasix and benicar and plaquenil. Dose adjusted her gabapentin for her AKI -follow in AM   Abdominal pain -recent hospitalization for colitis and vague history with abdominal pain and diarrhea and sepsis criteria.  -has pain on exam, no rebound or guarding  -checking stat abdo/pelvis CT.  -per GI visit notes on 9/30 she had intermittent abdominal discomfort and diarrhea and thought was secondary to IBS. -hold linzess in setting of diarrhea episodes     Hypokalemia with hypomagnesia  -Both potassium and magnesium repleted in the ED -Continue to monitor and replace as needed -continue home daily potassium     ILD (interstitial lung disease) w/ chronic respiratory failure and extensive bronchiectasis -on 2L [place], which is her baseline -stable at this time  -continue nebulizers prn and daily mucinex, no longer on chronic azithromycin      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) -hold GLP-1  -SSI and accuchecks per protocol -a1c of 5.6 09/2020     Chronic diastolic CHF (congestive heart failure) ([place]) -echo: 05/2018: EF of 60 to 65%.  Mild focal basal hypertrophy of the septum.  LV function normal.  Pulmonary artery systolic pressure mildly increased.  Right ventricle mildly dilated. -monitor input/output and daily weights -does not appear volume overloaded at this time, bnp wnl and CXR with no signs of volume overload/edema.  -bolused 1.5L total with time limited maintenance fluids -holding lasix/ARB in setting of ARB -continue to monitor volume status     Essential hypertension -soft blood pressure readings. Holding lasix/olmesartan with AKI -hold metoprolol today and resume tomorrow if BP allows.  -prn IV lopressor     Rheumatoid arthritis ([place]) -continue daily prednisone/avara at this time -hold plaquenil with AKI     Dyslipidemia -continue lipitor   Chronic pain -followed outpatient by pain management.  -contineu cymbalta,  gabapentin, MS contin. Gabapentin renally adjusted was on 670m TID, decreased to 4054mTID.    [place]ocyt[place] -likely secondary to sepsis, continue to monitor  [facility] 2020 [facility], no longer anticoagulated since provoked.   Left eye scleral injection [place] lamp negative for corneal abrasion -clear eyes QID prn     There is no height or weight on file to calculate BMI.    Level of care: Telemetry Medical DVT prophylaxis:  heparin  Code Status: DNR - confirmed with patient/family Family Communication: daughter at bedside: SuJerelyn ScottDisposition Plan:  The patient is from: home  Anticipated d/c is to: d/c per day team . Placed in observation for IV antibiotics for sepsis secondary to UTI, IVF for AKI for less than 2 midnight stay.    Patient is currently: stable, but ill  Consults called: none   Admission status:  observation   Dragon dictation used in completing this note.    Orma Flaming MD [facility]   How to contact the [facility] Attending or Consulting provider [place] or covering provider during after hours [place], for this patient?  Check the care team in [facility] and look for a) attending/consulting [facility] provider listed and b) the [facility] team listed Log into [email or web address] and use [place]'s universal password to access. If you do not have the password, please contact the hospital operator. Locate the [facility] provider you are looking for under [facility] and page to a number that you can be directly reached. If you still have difficulty reaching the provider, please page the [facility] (Director on Call) for the Hospitalists listed on amion for assistance.   03/30/2021, 3:16 PM

## 2021-03-30 NOTE — Progress Notes (Signed)
   03/30/21 1735  Assess: MEWS Score  Temp 98.2 F (36.8 C)  BP 137/76  Pulse Rate (!) 110  Resp 18  SpO2 (!) 86 %  O2 Device Room Air  Assess: MEWS Score  MEWS Temp 0  MEWS Systolic 0  MEWS Pulse 1  MEWS RR 0  MEWS LOC 1  MEWS Score 2  MEWS Score Color Yellow  Assess: if the MEWS score is Yellow or Red  Were vital signs taken at a resting state? Yes  Focused Assessment No change from prior assessment  Early Detection of Sepsis Score *See Row Information* High  MEWS guidelines implemented *See Row Information* Yes  Treat  MEWS Interventions Administered scheduled meds/treatments  Pain Scale 0-10  Pain Score 8  Pain Location Knee  Pain Orientation Left  Take Vital Signs  Increase Vital Sign Frequency  Yellow: Q 2hr X 2 then Q 4hr X 2, if remains yellow, continue Q 4hrs  Escalate  MEWS: Escalate Yellow: discuss with charge nurse/RN and consider discussing with provider and RRT  Notify: Charge Nurse/RN  Name of Charge Nurse/RN Notified [place], RN  Date Charge Nurse/RN Notified 03/30/21  Time Charge Nurse/RN Notified [id number]  Notify: Provider  Provider Name/Title Rogers Blocker  Date Provider Notified 03/30/21  Time Provider Notified 1805  Notification Type Page  Notification Reason Other (Comment) (new admission)  Provider response No new orders  Date of Provider Response 03/30/21  Time of Provider Response 1808  Document  Patient Outcome Stabilized after interventions  Progress note created (see row info) Yes  Oxygen Sats 95% on 3 Liters of oxygen, pt states she is on home oxygen

## 2021-03-30 NOTE — Progress Notes (Signed)
   03/30/21 1735  Assess: MEWS Score  Temp 98.2 F (36.8 C)  BP 137/76  Pulse Rate (!) 110  Resp 18  SpO2 (!) 86 %  O2 Device Room Air  Assess: MEWS Score  MEWS Temp 0  MEWS Systolic 0  MEWS Pulse 1  MEWS RR 0  MEWS LOC 1  MEWS Score 2  MEWS Score Color Yellow  Assess: if the MEWS score is Yellow or Red  Were vital signs taken at a resting state? Yes  Focused Assessment No change from prior assessment  Early Detection of Sepsis Score *See Row Information* High  MEWS guidelines implemented *See Row Information* Yes  Treat  MEWS Interventions Administered scheduled meds/treatments  Pain Scale 0-10  Pain Score 8  Pain Location Knee  Pain Orientation Left  Take Vital Signs  Increase Vital Sign Frequency  Yellow: Q 2hr X 2 then Q 4hr X 2, if remains yellow, continue Q 4hrs  Escalate  MEWS: Escalate Yellow: discuss with charge nurse/RN and consider discussing with provider and RRT  Notify: Charge Nurse/RN  Date Charge Nurse/RN Notified 03/30/21  Time Charge Nurse/RN Notified [id number]  Notify: Provider  Provider Name/Title Rogers Blocker  Date Provider Notified 03/30/21  Time Provider Notified 1805  Notification Type Page  Notification Reason Other (Comment) (new admission)  Provider response No new orders  Date of Provider Response 03/30/21  Time of Provider Response [phone]

## 2021-03-30 NOTE — ED Triage Notes (Signed)
Pt BIB [facility] from home d/t AMS. Per EMS, family found pt being confused and not getting up around 3am this morning. Pt's baseline is A&O X4,ambulates using a walker. Family also reported decreased po intake over the past few days. Pt appears nonverbal, only responds to voice and pain.   Ems VITALS  Ekg UNREMARKABLE.   BP 90/60  CBG 151

## 2021-03-30 NOTE — ED Provider Notes (Signed)
[facility] EMERGENCY DEPARTMENT Provider Note   CSN: [id number] Arrival date & time: 03/30/21  1137     History Chief Complaint  Patient presents with   Altered Mental Status    [name] is a 80 y.o. female with a history of CHF, CKD, COPD, diabetes mellitus.  Brought to emergency department by EMS for complaints of altered mental status.  Patient nonverbal at present, level 5 caveat applies.  Spoke to patient's son via phone reports that he saw patient going to the bathroom this morning at 0 300.  Patient was walking without any difficulty.  Reported everything was okay.  She was asleep when son went to work this morning.  States that mother has not had any acute illness in the last few days.  Has been complaining of back pain and had increasing her morphine dose.  Spoke to patient's daughter who is at bedside.  Reports that her other sister came over this morning at 0 830 to check in on their mother.  At that time mother was confused and "not moving like normal."  Upon her arrival approximately 1000 patient was able to move all limbs however appeared weaker than usual.  Patient was also confused and not remembering details.  She notes that she saw an area to her groin she was concerned it could be a possible infection while cleaning her this morning.  States that patient had diarrhea during the night.  States that normally patient is able to take care of her self, is alert and oriented x3.  Denies any recent cough, vomiting, fevers.   Altered Mental Status     Past Medical History:  Diagnosis Date   Acute on chronic diastolic (congestive) heart failure (HCC)    Anemia    iron deficiency anemia - secondary to blood loss ( chronic)    Anxiety    Arthritis    endstage changes bilateral knees/bilateral ankles.    Asthma    Carotid artery occlusion    Chronic fatigue    Chronic kidney disease    Closed left hip fracture (HCC)    Clotting disorder ([place])     pt denies this   Contusion of left knee    due to fall 1/14.   COPD (chronic obstructive pulmonary disease) (HCC)    pulmonary fibrosis   Depression, reactive    Diabetes mellitus    type II    Diastolic dysfunction    Difficulty in walking    Family history of heart disease    Generalized muscle weakness    Gout    High cholesterol    History of falling    Hypertension    Hypothyroidism    Interstitial lung disease ([place])    Meningioma of left sphenoid wing involving cavernous sinus ([place]) 02/17/2012   Continue diplopia, left eye pain and left headaches.     Morbid obesity ([place])    Morbid obesity ([place])    Neuromuscular disorder ([place])    diabetic neuropathy    Normal coronary arteries    cardiac catheterization performed  10/31/14   Pneumonia    RA (rheumatoid arthritis) ([place])    has been off methotreaxte since 10/13.   Rheumatoid arthritis (HCC)    Shortness of breath    Spinal stenosis of lumbar region    Thyroid disease    Unspecified lack of coordination    URI (upper respiratory infection)     Patient Active Problem List   Diagnosis Date  Noted   Severe sepsis with lactic acidosis (HCC) 01/16/2021   Colitis 80/16/5537   Acute metabolic encephalopathy 48/27/0786   Right lower lobe pneumonia 11/22/2020   Pre-operative respiratory examination 11/22/2020   Diabetic neuropathy ([place]) 05/24/2019   Sepsis secondary to UTI ([place]) 04/25/2019   Cellulitis in diabetic foot ([place]) 04/25/2019   Hypokalemia 04/21/2019   Anticoagulated 06/25/2018   Chronic pain 06/25/2018   CRI (chronic renal insufficiency), stage 3 (moderate) (HCC) 06/25/2018   Drug-induced constipation 06/18/2018   DVT (deep venous thrombosis) ([place]) 06/03/2018   Syncope and collapse 06/02/2018   Chest pain 06/02/2018   Primary osteoarthritis of both knees 05/26/2018   Body mass index (BMI) of 50-59.9 in adult [facility]) 05/26/2018   Acquired hypothyroidism 05/26/2018   Chronic bilateral low back pain without  sciatica 02/17/2018   Astigmatism with presbyopia, bilateral 03/26/2017   Cortical age-related cataract of both eyes 03/26/2017   Family history of glaucoma 03/26/2017   Long term current use of oral hypoglycemic drug 03/26/2017   Nuclear sclerotic cataract of both eyes 03/26/2017   Pain in left hip 02/18/2017   Type 2 diabetes mellitus ([place]) 01/16/2017   Closed nondisplaced intertrochanteric fracture of left femur with delayed healing 01/14/2017   Postoperative wound infection 01/14/2017   Gout 11/29/2016   Depression 11/29/2016   Closed left hip fracture ([place]) 11/29/2016   GERD (gastroesophageal reflux disease) 11/29/2016   Closed intertrochanteric fracture of hip, left, initial encounter ([place]) 11/29/2016   Physical deconditioning 07/31/2015   Pulmonary fibrosis, postinflammatory ([place]) 07/31/2015   Bronchiectasis without complication ([place]) 75/44/9201   Tendinitis of left rotator cuff 06/13/2015   Chronic diastolic CHF (congestive heart failure) ([place]) 04/21/2015   AKI (acute kidney injury) ([place])    Acute kidney injury superimposed on chronic kidney disease ([place]) 04/17/2015   Hypotension 04/17/2015   Chronic respiratory failure with hypoxia ([place]) 02/09/2015   Dyspnea and respiratory abnormality 01/03/2015   Normal coronary arteries 11/02/2014   Morbid obesity ([place]) 10/30/2014   Dysphagia 08/21/2014   Chronic fatigue 06/21/2014   DOE (dyspnea on exertion) 05/24/2014   Primary osteoarthritis of left knee 04/26/2014   High risk medication use 04/17/2014   Aphasia 02/12/2014   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 ([place]) 01/18/2014   Lumbar facet arthropathy 01/18/2014   Bilateral edema of lower extremity 05/30/2013   Chronic cough 05/24/2013   ILD (interstitial lung disease) ([place]) 04/10/2013   Greater trochanteric bursitis of left hip 08/13/2012   Spinal stenosis of lumbar region with radiculopathy 07/12/2012   Inability to walk 07/12/2012   Asymptomatic bacteriuria  07/12/2012   Meningioma of left sphenoid wing involving cavernous sinus ([place]) 02/17/2012   Chest pain of uncertain etiology 00/71/2197   Rheumatoid arthritis ([place]) 01/28/2012   Primary osteoarthritis of right knee 01/28/2012   Dyslipidemia    Thyroid disease    Essential hypertension     Past Surgical History:  Procedure Laterality Date   ABDOMINAL HYSTERECTOMY     BRAIN SURGERY     Gamma knife 10/13. Needs repeat spring  '14   CARDIAC CATHETERIZATION N/A 10/31/2014   Procedure: Right/Left Heart Cath and Coronary Angiography;  Surgeon: [name], MD;  Location: [facility];  Service: Cardiovascular;  Laterality: N/A;   ESOPHAGOGASTRODUODENOSCOPY (EGD) WITH PROPOFOL N/A 09/14/2014   Procedure: ESOPHAGOGASTRODUODENOSCOPY (EGD) WITH PROPOFOL;  Surgeon: [name], MD;  Location: [facility];  Service: Endoscopy;  Laterality: N/A;   INCISION AND DRAINAGE HIP Left 01/16/2017   Procedure: IRRIGATION  AND DEBRIDEMENT LEFT HIP;  Surgeon: [name], MD;  Location: [facility];  Service: Orthopedics;  Laterality: Left;   INTRAMEDULLARY (IM) NAIL INTERTROCHANTERIC Left 11/29/2016   Procedure: INTRAMEDULLARY (IM) NAIL INTERTROCHANTRIC;  Surgeon: [name], MD;  Location: [facility];  Service: Orthopedics;  Laterality: Left;   OVARY SURGERY     SHOULDER SURGERY Left    TONSILLECTOMY  age 9   VIDEO BRONCHOSCOPY Bilateral 05/31/2013   Procedure: VIDEO BRONCHOSCOPY WITHOUT FLUORO;  Surgeon: Brand Males, MD;  Location: [facility];  Service: Cardiopulmonary;  Laterality: Bilateral;   video bronscoscopy  2000   lung     OB History   No obstetric history on file.     Family History  Problem Relation Age of Onset   Diabetes Mother    Heart attack Mother    Hypertension Father    Lung cancer Father    Diabetes Sister    Breast cancer Sister    Breast cancer Sister    Diabetes Brother    Hypertension Brother    Heart disease Brother    Heart attack  Brother    Kidney cancer Brother    Gout Brother    Kidney failure Brother        x 5   Esophageal cancer Brother    Stomach cancer Brother    Prostate cancer Brother    Uterine cancer Daughter    Hypertension Daughter    Hypertension Daughter    Rheum arthritis Maternal Uncle    Hypertension Son    Heart murmur Son     Social History   Tobacco Use   Smoking status: Never   Smokeless tobacco: Never  Vaping Use   Vaping Use: Never used  Substance Use Topics   Alcohol use: No   Drug use: No    Home Medications Prior to Admission medications   Medication Sig Start Date End Date Taking? Authorizing Provider  acetaminophen (TYLENOL) 500 MG tablet Take 1,000 mg by mouth every 6 (six) hours as needed for moderate pain.     [provider]  albuterol (PROVENTIL HFA;VENTOLIN HFA) 108 (90 BASE) MCG/ACT inhaler Inhale 2 puffs into the lungs every 6 (six) hours as needed for wheezing or shortness of breath.    [provider]  allopurinol (ZYLOPRIM) 300 MG tablet Take 300 mg by mouth daily.    [provider]  Artificial Tear Ointment (DRY EYES OP) Apply 1 drop to eye daily as needed (dry eyes).    [provider]  aspirin buffered (BUFFERIN) 325 MG TABS tablet Take 325 mg by mouth daily.    [provider]  atorvastatin (LIPITOR) 20 MG tablet Take 0.5 tablets (10 mg total) by mouth every evening. 09/26/20   [name], MD  Blood Pressure Monitoring (BLOOD PRESSURE MONITOR/ARM) DEVI To check blood pressure up to 3 times daily. 02/20/21   [name], MD  Calcium Carb-Cholecalciferol (CALCIUM + D3 PO) Take 2 tablets by mouth daily with lunch.     [provider]  cholecalciferol (VITAMIN D) 1000 UNITS tablet Take 1,000 Units by mouth daily with lunch.    [provider]  colchicine 0.6 MG tablet .6 mg AS NEEDED (route: oral) 01/17/21   [provider]  cyclobenzaprine (FLEXERIL) 5 MG tablet Take 1 tablet (5 mg  total) by mouth at bedtime as needed for muscle spasms. 12/14/19   [name], NP  diclofenac sodium (VOLTAREN) 1 % GEL Apply 2 g  topically 3 (three) times daily. Both hands and knees 02/08/19   [name], NP  DULoxetine (CYMBALTA) 60 MG capsule Take 1 capsule (60 mg total) by mouth daily. 02/13/21   Meredith Staggers, MD  fluticasone (FLONASE) 50 MCG/ACT nasal spray Place 2 sprays into both nostrils daily. 06/14/20   [name], MD  furosemide (LASIX) 80 MG tablet Take 1 tablet (80 mg total) by mouth daily. 11/22/20   Lorretta Harp, MD  gabapentin (NEURONTIN) 600 MG tablet Take two in the morning , Two mid- afternoon and two at bedtime Patient taking differently: Take 600 mg by mouth 3 (three) times daily. 05/29/20   [name], NP  guaiFENesin (MUCINEX) 600 MG 12 hr tablet Take 1,200 mg by mouth 2 (two) times daily.    [provider]  hydroxychloroquine (PLAQUENIL) 200 MG tablet Take 200 mg by mouth daily.    [provider]  ipratropium-albuterol (DUONEB) 0.5-2.5 (3) MG/3ML SOLN Take 3 mLs by nebulization every 6 (six) hours as needed (cough/wheezing).     [provider]  leflunomide (ARAVA) 20 MG tablet Take 1 tablet (20 mg total) by mouth daily. 01/23/21 01/23/22  Ghimire, Henreitta Leber, MD  levothyroxine (SYNTHROID) 50 MCG tablet Take 1 tablet (50 mcg total) by mouth daily. 09/26/20   [name], MD  linaclotide [facility]) 72 MCG capsule Take 1 capsule (72 mcg total) by mouth daily before breakfast. 10/09/20   [name], MD  metolazone (ZAROXOLYN) 2.5 MG tablet TAKE 1 TABLET BY MOUTH DAILY AS NEEDED FOR 2 DAYS FOR WEIGHT ABOVE 255 POUNDS 03/30/20   Lorretta Harp, MD  metoprolol succinate (TOPROL-XL) 25 MG 24 hr tablet Take 5 tablets (125 mg total) by mouth daily. 03/11/21   [name], MD  morphine (MS CONTIN) 15 MG 12 hr tablet Take 1 tablet (15 mg total) by mouth every 12 (twelve) hours. Do Not Fill Before 04/15/2021 03/27/21    [name], NP  morphine (MSIR) 15 MG tablet One tablet twice a day as needed for break through pain. 02/13/21   Meredith Staggers, MD  Multiple Vitamin (MULTIVITAMIN WITH MINERALS) TABS Take 1 tablet by mouth daily.     [provider]  olmesartan (BENICAR) 40 MG tablet Take 1 tablet (40 mg total) by mouth daily. 09/26/20   [name], MD  OXYGEN-HELIUM IN Inhale 2 L into the lungs at bedtime. And on exertion    [provider]  pantoprazole (PROTONIX) 40 MG tablet take 1 tablet by mouth once daily [place] DAY Patient taking differently: Take 40 mg by mouth daily. 09/26/20   [name], MD  Potassium Chloride ER 20 MEQ TBCR 20 mEq DAILY (route: oral) 01/17/21   [provider]  predniSONE (DELTASONE) 5 MG tablet Take 10 mg by mouth every morning.  06/20/15   [provider]  Semaglutide,0.25 or 0.5MG/DOS, (OZEMPIC, 0.25 OR 0.5 MG/DOSE,) 2 MG/1.5ML SOPN .25 mg WEEKLY (route: subcutaneous) 01/17/21   [provider]  sodium chloride (OCEAN) 0.65 % SOLN nasal spray Place 1 spray into both nostrils as needed for congestion.    [provider]  Spacer/Aero-Holding Chambers (AEROCHAMBER MV) inhaler Use as instructed 06/10/13   Parrett, Fonnie Mu, NP    Allergies    Codeine, Infliximab, and Lisinopril  Review of Systems   Review of Systems  Unable to perform ROS: Mental status change   Physical Exam Updated Vital  Signs BP 119/72   Pulse (!) 117   Temp (!) 100.5 F (38.1 C) (Rectal)   SpO2 99%   Physical Exam Vitals and nursing note reviewed. Exam conducted with a chaperone present (Female RN present as chaperone).  Constitutional:      General: She is not in acute distress.    Appearance: She is ill-appearing. She is not toxic-appearing or diaphoretic.  HENT:     Head: Normocephalic.  Eyes:     General: No scleral icterus.       Right eye: No discharge.        Left eye: No discharge.     Pupils:  Pupils are equal, round, and reactive to light.     Comments: Pupils 4 mm bilaterally  Cardiovascular:     Rate and Rhythm: Tachycardia present.  Pulmonary:     Effort: Pulmonary effort is normal. Tachypnea present. No bradypnea or respiratory distress.     Breath sounds: No stridor. No wheezing, rhonchi or rales.     Comments: Course lung sounds to bilateral lower lobes Abdominal:     General: There is no distension. There are no signs of injury.     Palpations: There is no mass or pulsatile mass.     Tenderness: There is no abdominal tenderness. There is no guarding or rebound.     Hernia: There is no hernia in the left inguinal area or right inguinal area.  Genitourinary:    Comments: Small wound at site of hair follicle with surrounding erythema.  No purulent discharge.  No fluctuance or induration.  No tenderness on exam. Lymphadenopathy:     Lower Body: No right inguinal adenopathy. No left inguinal adenopathy.  Skin:    General: Skin is warm and dry.  Neurological:     General: No focal deficit present.     Mental Status: She is alert.  Psychiatric:        Behavior: Behavior is cooperative.    ED Results / Procedures / Treatments   Labs (all labs ordered are listed, but only abnormal results are displayed) Labs Reviewed  COMPREHENSIVE METABOLIC PANEL - Abnormal; Notable for the following components:      Result Value   Potassium 3.1 (*)    Glucose, Bld 130 (*)    Creatinine, Ser 2.22 (*)    Calcium 8.2 (*)    Total Protein 5.4 (*)    Albumin 3.0 (*)    GFR, Estimated 22 (*)    All other components within normal limits  CBC WITH DIFFERENTIAL/PLATELET - Abnormal; Notable for the following components:   MCV 102.3 (*)    Platelets 119 (*)    Monocytes Absolute 1.1 (*)    All other components within normal limits  URINALYSIS, ROUTINE W REFLEX MICROSCOPIC - Abnormal; Notable for the following components:   Color, Urine AMBER (*)    APPearance CLOUDY (*)    Hgb urine  dipstick SMALL (*)    Protein, ur 30 (*)    Leukocytes,Ua LARGE (*)    Bacteria, UA RARE (*)    All other components within normal limits  OSMOLALITY - Abnormal; Notable for the following components:   Osmolality 301 (*)    All other components within normal limits  MAGNESIUM - Abnormal; Notable for the following components:   Magnesium 1.6 (*)    All other components within normal limits  CBG MONITORING, ED - Abnormal; Notable for the following components:   Glucose-Capillary 112 (*)    All other  components within normal limits  I-STAT VENOUS BLOOD GAS, ED - Abnormal; Notable for the following components:   pCO2, Ven 66.5 (*)    pO2, Ven 98.0 (*)    Bicarbonate 38.6 (*)    TCO2 41 (*)    Acid-Base Excess 11.0 (*)    Potassium 3.3 (*)    Calcium, Ion 0.95 (*)    All other components within normal limits  RESP PANEL BY RT-PCR (FLU A&B, COVID) ARPGX2  CULTURE, BLOOD (ROUTINE X 2)  CULTURE, BLOOD (ROUTINE X 2)  URINE CULTURE  LACTIC ACID, PLASMA  PROTIME-INR  APTT  AMMONIA  BRAIN NATRIURETIC PEPTIDE  BLOOD GAS, ARTERIAL    EKG EKG Interpretation  Date/Time:  Saturday March 30 2021 12:07:13 EST Ventricular Rate:  115 PR Interval:  138 QRS Duration: 84 QT Interval:  336 QTC Calculation: 465 R Axis:   -45 Text Interpretation: Sinus tachycardia Left anterior fascicular block Low voltage, precordial leads Abnormal R-wave progression, late transition Similar to prior Similar to prior Confirmed by Wynona Dove ([id number]) on 03/30/2021 1:24:19 PM  Radiology CT HEAD WO CONTRAST (5MM)  Result Date: 03/30/2021 CLINICAL DATA:  Mental status change EXAM: CT HEAD WITHOUT CONTRAST TECHNIQUE: Contiguous axial images were obtained from the base of the skull through the vertex without intravenous contrast. COMPARISON:  Head CT dated January 16, 2021 FINDINGS: Brain: Chronic white matter ischemic change. No evidence of acute infarction, hemorrhage, hydrocephalus, extra-axial collection or  mass lesion/mass effect. Vascular: No hyperdense vessel or unexpected calcification. Skull: Normal. Negative for fracture or focal lesion. Sinuses/Orbits: No acute finding. Other: None. IMPRESSION: No acute intracranial abnormality. Electronically Signed   By: Yetta Glassman M.D.   On: 03/30/2021 12:36   DG Chest Port 1 View  Result Date: 03/30/2021 CLINICAL DATA:  80 year old female with possible sepsis. EXAM: PORTABLE CHEST 1 VIEW COMPARISON:  Chest x-ray 01/16/2021. FINDINGS: Lung volumes are low. Widespread areas of interstitial prominence and diffuse peribronchial cuffing noted in the lungs bilaterally, most severe there out the mid to lower lungs. No definite confluent consolidative airspace disease. No pleural effusions. No pneumothorax. Crowding of the pulmonary vasculature. No evidence of pulmonary edema. Heart size is normal. Mediastinal contours are distorted by patient positioning. IMPRESSION: 1. The appearance the chest is very similar to the prior chest radiograph, suggestive of interstitial lung disease. Outpatient referral to Pulmonology for further clinical evaluation is recommended. Additionally, follow-up nonemergent high-resolution chest CT is recommended in the near future to better evaluate these findings. Electronically Signed   By: Vinnie Langton M.D.   On: 03/30/2021 12:27    Procedures .Critical Care Performed by: Loni Beckwith, PA-C Authorized by: Loni Beckwith, PA-C   Critical care provider statement:    Critical care time (minutes):  45   Critical care was necessary to treat or prevent imminent or life-threatening deterioration of the following conditions:  Sepsis   Critical care was time spent personally by me on the following activities:  Development of treatment plan with patient or surrogate, evaluation of patient's response to treatment, examination of patient, obtaining history from patient or surrogate, ordering and performing treatments and  interventions, ordering and review of laboratory studies, ordering and review of radiographic studies, pulse oximetry, re-evaluation of patient's condition and review of old charts   Care discussed with: admitting provider     Medications Ordered in ED Medications  lactated ringers infusion ( Intravenous New Bag/Given 03/30/21 1358)  cefTRIAXone (ROCEPHIN) 2 g in sodium chloride 0.9 % 100 mL  IVPB (0 g Intravenous Stopped 03/30/21 1434)  magnesium sulfate IVPB 2 g 50 mL (has no administration in time range)  acetaminophen (TYLENOL) suppository 650 mg (650 mg Rectal Given 03/30/21 1252)  lactated ringers bolus 1,000 mL (0 mLs Intravenous Stopped 03/30/21 1341)  potassium chloride SA (KLOR-CON) CR tablet 40 mEq (40 mEq Oral Given 03/30/21 1351)  tetracaine (PONTOCAINE) 0.5 % ophthalmic solution 2 drop (2 drops Both Eyes Given 03/30/21 1518)  fluorescein ophthalmic strip 1 strip (1 strip Both Eyes Given 03/30/21 1518)    ED Course  I have reviewed the triage vital signs and the nursing notes.  Pertinent labs & imaging results that were available during my care of the patient were reviewed by me and considered in my medical decision making (see chart for details).  Clinical Course as of 03/30/21 1519  Sat Mar 30, 2021  1331 Patient started to complain of irritation to left eye.  Describes irritation as "burning sensation."  EOM intact bilaterally.  Pupils PERRL.  No foreign body appreciated under left eyelid.  Will perform fluorescein stain.  No corneal abrasion noted.  Suspect eye irritation.  Patient reports improvement after insertion of tetracaine eyedrops. [PB]  1500 Spoke to Dr. Eliberto Ivory who will see the patient for admission. [PB]    Clinical Course User Index [PB] Loni Beckwith, PA-C   MDM Rules/Calculators/A&P                           71 female, ill-appearing alert to painful stimuli.  Brought to emergency department for complaints of altered mental status.  Last seen normal  at 0300 this morning.  Patient noted to be tachycardic with fever, will initiate ED evolving sepsis work-up at this time.  On repeat assessment patient is resting comfortably, alert to voice.  Patient found to be alert and oriented x3.  Denies any pain or discomfort at this time.  Denies any recent illness.  States that she has had decreased urinary output over the last 3 to 4 days.  Patient has been drinking fluids per usual approximately 3-4 bottles of water a day.  Patient has been taking Lasix medication as prescribed.    Grip strength equal, patient able to move all limbs equally without any difficulty.  No facial asymmetry or dysarthria.  CT head shows no acute intracranial abnormality Chest x-ray shows no signs of infection, findings consistent with interstitial lung disease  VBG shows patient CO2 slightly increased at 66.5, does have history of interstitial lung disease and COPD.  CMP shows AKI with creatinine elevated at 2.22, hyperkalemia noted at 3.1.  Will obtain magnesium level and give patient oral potassium. CBC shows no leukocytosis or anemia. Ammonia less than 10 Lactic acid within normal limits  Urinalysis shows bacteria rare, WBC 21-50, RBC 6-10, leukocyte large, nitrite negative.  Urine culture pending.  Suspect UTI as source of infection.  We will start patient on antibiotics to treat urinary tract infection.  Will consult hospitalist to admit for sepsis secondary to UTI with acute kidney injury.  Spoke to Dr. Eliberto Ivory who will see the patient for admission.  Patient care discussed with attending physician Dr. Pearline Cables.  Final Clinical Impression(s) / ED Diagnoses Final diagnoses:  Sepsis with acute renal failure without septic shock, due to unspecified organism, unspecified acute renal failure type [facility])    Rx / DC Orders ED Discharge Orders     None        Ivo Moga,  Rudell Cobb, PA-C 03/30/21 [place], [place], DO 03/30/21 [phone]

## 2021-03-31 ENCOUNTER — Observation Stay (HOSPITAL_COMMUNITY): Payer: Medicare Other

## 2021-03-31 DIAGNOSIS — E1140 Type 2 diabetes mellitus with diabetic neuropathy, unspecified: Secondary | ICD-10-CM | POA: Diagnosis present

## 2021-03-31 DIAGNOSIS — I700 Atherosclerosis of aorta: Secondary | ICD-10-CM | POA: Diagnosis not present

## 2021-03-31 DIAGNOSIS — R2681 Unsteadiness on feet: Secondary | ICD-10-CM | POA: Diagnosis not present

## 2021-03-31 DIAGNOSIS — I130 Hypertensive heart and chronic kidney disease with heart failure and stage 1 through stage 4 chronic kidney disease, or unspecified chronic kidney disease: Secondary | ICD-10-CM | POA: Diagnosis present

## 2021-03-31 DIAGNOSIS — I10 Essential (primary) hypertension: Secondary | ICD-10-CM

## 2021-03-31 DIAGNOSIS — D696 Thrombocytopenia, unspecified: Secondary | ICD-10-CM | POA: Diagnosis present

## 2021-03-31 DIAGNOSIS — N200 Calculus of kidney: Secondary | ICD-10-CM | POA: Diagnosis not present

## 2021-03-31 DIAGNOSIS — N189 Chronic kidney disease, unspecified: Secondary | ICD-10-CM

## 2021-03-31 DIAGNOSIS — I5033 Acute on chronic diastolic (congestive) heart failure: Secondary | ICD-10-CM | POA: Diagnosis present

## 2021-03-31 DIAGNOSIS — M5440 Lumbago with sciatica, unspecified side: Secondary | ICD-10-CM

## 2021-03-31 DIAGNOSIS — R1312 Dysphagia, oropharyngeal phase: Secondary | ICD-10-CM | POA: Diagnosis not present

## 2021-03-31 DIAGNOSIS — L8990 Pressure ulcer of unspecified site, unspecified stage: Secondary | ICD-10-CM | POA: Insufficient documentation

## 2021-03-31 DIAGNOSIS — R5381 Other malaise: Secondary | ICD-10-CM | POA: Diagnosis not present

## 2021-03-31 DIAGNOSIS — Z9189 Other specified personal risk factors, not elsewhere classified: Secondary | ICD-10-CM

## 2021-03-31 DIAGNOSIS — N390 Urinary tract infection, site not specified: Secondary | ICD-10-CM | POA: Diagnosis present

## 2021-03-31 DIAGNOSIS — U071 COVID-19: Secondary | ICD-10-CM | POA: Diagnosis not present

## 2021-03-31 DIAGNOSIS — A4159 Other Gram-negative sepsis: Secondary | ICD-10-CM | POA: Diagnosis present

## 2021-03-31 DIAGNOSIS — E8729 Other acidosis: Secondary | ICD-10-CM | POA: Diagnosis present

## 2021-03-31 DIAGNOSIS — J479 Bronchiectasis, uncomplicated: Secondary | ICD-10-CM | POA: Diagnosis present

## 2021-03-31 DIAGNOSIS — J9611 Chronic respiratory failure with hypoxia: Secondary | ICD-10-CM | POA: Diagnosis present

## 2021-03-31 DIAGNOSIS — A419 Sepsis, unspecified organism: Secondary | ICD-10-CM | POA: Diagnosis not present

## 2021-03-31 DIAGNOSIS — M069 Rheumatoid arthritis, unspecified: Secondary | ICD-10-CM | POA: Diagnosis present

## 2021-03-31 DIAGNOSIS — Z66 Do not resuscitate: Secondary | ICD-10-CM | POA: Diagnosis present

## 2021-03-31 DIAGNOSIS — G9341 Metabolic encephalopathy: Secondary | ICD-10-CM

## 2021-03-31 DIAGNOSIS — T402X5A Adverse effect of other opioids, initial encounter: Secondary | ICD-10-CM | POA: Diagnosis present

## 2021-03-31 DIAGNOSIS — M2550 Pain in unspecified joint: Secondary | ICD-10-CM | POA: Diagnosis not present

## 2021-03-31 DIAGNOSIS — Z6837 Body mass index (BMI) 37.0-37.9, adult: Secondary | ICD-10-CM | POA: Diagnosis not present

## 2021-03-31 DIAGNOSIS — M48061 Spinal stenosis, lumbar region without neurogenic claudication: Secondary | ICD-10-CM

## 2021-03-31 DIAGNOSIS — I5032 Chronic diastolic (congestive) heart failure: Secondary | ICD-10-CM | POA: Diagnosis not present

## 2021-03-31 DIAGNOSIS — E876 Hypokalemia: Secondary | ICD-10-CM

## 2021-03-31 DIAGNOSIS — Z741 Need for assistance with personal care: Secondary | ICD-10-CM | POA: Diagnosis not present

## 2021-03-31 DIAGNOSIS — J849 Interstitial pulmonary disease, unspecified: Secondary | ICD-10-CM | POA: Diagnosis not present

## 2021-03-31 DIAGNOSIS — E1165 Type 2 diabetes mellitus with hyperglycemia: Secondary | ICD-10-CM | POA: Diagnosis present

## 2021-03-31 DIAGNOSIS — M5416 Radiculopathy, lumbar region: Secondary | ICD-10-CM | POA: Diagnosis present

## 2021-03-31 DIAGNOSIS — Z7401 Bed confinement status: Secondary | ICD-10-CM | POA: Diagnosis not present

## 2021-03-31 DIAGNOSIS — M6281 Muscle weakness (generalized): Secondary | ICD-10-CM | POA: Diagnosis not present

## 2021-03-31 DIAGNOSIS — J8410 Pulmonary fibrosis, unspecified: Secondary | ICD-10-CM | POA: Diagnosis not present

## 2021-03-31 DIAGNOSIS — E1122 Type 2 diabetes mellitus with diabetic chronic kidney disease: Secondary | ICD-10-CM | POA: Diagnosis present

## 2021-03-31 DIAGNOSIS — R6520 Severe sepsis without septic shock: Secondary | ICD-10-CM | POA: Diagnosis present

## 2021-03-31 DIAGNOSIS — E039 Hypothyroidism, unspecified: Secondary | ICD-10-CM | POA: Diagnosis present

## 2021-03-31 DIAGNOSIS — N179 Acute kidney failure, unspecified: Secondary | ICD-10-CM | POA: Diagnosis present

## 2021-03-31 DIAGNOSIS — G928 Other toxic encephalopathy: Secondary | ICD-10-CM | POA: Diagnosis present

## 2021-03-31 DIAGNOSIS — G9340 Encephalopathy, unspecified: Secondary | ICD-10-CM

## 2021-03-31 DIAGNOSIS — N281 Cyst of kidney, acquired: Secondary | ICD-10-CM | POA: Diagnosis not present

## 2021-03-31 DIAGNOSIS — Z20822 Contact with and (suspected) exposure to covid-19: Secondary | ICD-10-CM | POA: Diagnosis present

## 2021-03-31 DIAGNOSIS — L89211 Pressure ulcer of right hip, stage 1: Secondary | ICD-10-CM | POA: Diagnosis present

## 2021-03-31 DIAGNOSIS — G8929 Other chronic pain: Secondary | ICD-10-CM

## 2021-03-31 HISTORY — DX: Urinary tract infection, site not specified: N39.0

## 2021-03-31 LAB — CBC
HCT: 36.5 % (ref 36.0–46.0)
Hemoglobin: 11.2 g/dL — ABNORMAL LOW (ref 12.0–15.0)
MCH: 30.6 pg (ref 26.0–34.0)
MCHC: 30.7 g/dL (ref 30.0–36.0)
MCV: 99.7 fL (ref 80.0–100.0)
Platelets: 98 10*3/uL — ABNORMAL LOW (ref 150–400)
RBC: 3.66 MIL/uL — ABNORMAL LOW (ref 3.87–5.11)
RDW: 14.5 % (ref 11.5–15.5)
WBC: 7.3 10*3/uL (ref 4.0–10.5)
nRBC: 0 % (ref 0.0–0.2)

## 2021-03-31 LAB — GLUCOSE, CAPILLARY
Glucose-Capillary: 119 mg/dL — ABNORMAL HIGH (ref 70–99)
Glucose-Capillary: 150 mg/dL — ABNORMAL HIGH (ref 70–99)
Glucose-Capillary: 160 mg/dL — ABNORMAL HIGH (ref 70–99)
Glucose-Capillary: 198 mg/dL — ABNORMAL HIGH (ref 70–99)

## 2021-03-31 LAB — BASIC METABOLIC PANEL
Anion gap: 8 (ref 5–15)
BUN: 9 mg/dL (ref 8–23)
CO2: 29 mmol/L (ref 22–32)
Calcium: 7.8 mg/dL — ABNORMAL LOW (ref 8.9–10.3)
Chloride: 104 mmol/L (ref 98–111)
Creatinine, Ser: 0.88 mg/dL (ref 0.44–1.00)
GFR, Estimated: >60 mL/min (ref >60)
Glucose, Bld: 119 mg/dL — ABNORMAL HIGH (ref 70–99)
Potassium: 3.6 mmol/L (ref 3.5–5.1)
Sodium: 141 mmol/L (ref 135–145)

## 2021-03-31 LAB — MAGNESIUM: Magnesium: 1.8 mg/dL (ref 1.7–2.4)

## 2021-03-31 LAB — BRAIN NATRIURETIC PEPTIDE: B Natriuretic Peptide: 16.7 pg/mL (ref 0.0–100.0)

## 2021-03-31 MED ORDER — METOPROLOL TARTRATE 12.5 MG HALF TABLET
12.5000 mg | ORAL_TABLET | Freq: Two times a day (BID) | ORAL | Status: DC
  Administered 2021-03-31 – 2021-04-01 (×3): 12.5 mg via ORAL
  Filled 2021-03-31 (×4): qty 1

## 2021-03-31 MED ORDER — POTASSIUM CHLORIDE CRYS ER 20 MEQ PO TBCR
40.0000 meq | EXTENDED_RELEASE_TABLET | Freq: Once | ORAL | Status: AC
  Administered 2021-03-31: 40 meq via ORAL
  Filled 2021-03-31: qty 2

## 2021-03-31 MED ORDER — DULOXETINE HCL 30 MG PO CPEP: 30.0000 mg | ORAL_CAPSULE | Freq: Every day | ORAL | Status: DC

## 2021-03-31 MED ORDER — ALLOPURINOL 100 MG PO TABS: 50.0000 mg | ORAL_TABLET | Freq: Every day | ORAL | Status: DC

## 2021-03-31 MED ORDER — POLYETHYLENE GLYCOL 3350 17 G PO PACK: 17.0000 g | PACK | Freq: Two times a day (BID) | ORAL | Status: DC | PRN

## 2021-03-31 MED ORDER — GABAPENTIN 600 MG PO TABS: 300.0000 mg | ORAL_TABLET | Freq: Two times a day (BID) | ORAL | Status: DC

## 2021-03-31 MED ORDER — GABAPENTIN 600 MG PO TABS
600.0000 mg | ORAL_TABLET | Freq: Three times a day (TID) | ORAL | Status: DC
  Administered 2021-03-31 – 2021-04-03 (×9): 600 mg via ORAL
  Filled 2021-03-31 (×11): qty 1

## 2021-03-31 MED ORDER — FENTANYL CITRATE PF 50 MCG/ML IJ SOSY: 25.0000 ug | PREFILLED_SYRINGE | INTRAMUSCULAR | Status: DC | PRN

## 2021-03-31 MED ORDER — DULOXETINE HCL 60 MG PO CPEP
60.0000 mg | ORAL_CAPSULE | Freq: Every day | ORAL | Status: DC
  Administered 2021-03-31 – 2021-04-03 (×4): 60 mg via ORAL
  Filled 2021-03-31 (×4): qty 1

## 2021-03-31 MED ORDER — ALLOPURINOL 300 MG PO TABS
300.0000 mg | ORAL_TABLET | Freq: Every day | ORAL | Status: DC
  Administered 2021-03-31 – 2021-04-03 (×4): 300 mg via ORAL
  Filled 2021-03-31 (×4): qty 1

## 2021-03-31 MED ORDER — SENNA 8.6 MG PO TABS
1.0000 | ORAL_TABLET | Freq: Every day | ORAL | Status: DC
  Administered 2021-03-31 – 2021-04-03 (×4): 8.6 mg via ORAL
  Filled 2021-03-31 (×4): qty 1

## 2021-03-31 MED ORDER — ENOXAPARIN SODIUM 40 MG/0.4ML IJ SOSY
40.0000 mg | PREFILLED_SYRINGE | INTRAMUSCULAR | Status: DC
  Administered 2021-03-31 – 2021-04-03 (×4): 40 mg via SUBCUTANEOUS
  Filled 2021-03-31 (×4): qty 0.4

## 2021-03-31 NOTE — Plan of Care (Signed)
  Problem: Education: Goal: Knowledge of General Education information will improve Description: Including pain rating scale, medication(s)/side effects and non-pharmacologic comfort measures Outcome: Progressing   Problem: Nutrition: Goal: Adequate nutrition will be maintained Outcome: Progressing   

## 2021-03-31 NOTE — Progress Notes (Addendum)
PROGRESS NOTE  [name] MLJ:[id number] DOB: November 20, 1940   PCP: [name], MD  Patient is from: Home.  Lives with daughter.  DOA: 03/30/2021 LOS: 0  Chief complaints:  Chief Complaint  Patient presents with   Altered Mental Status     Brief Narrative / Interim history: 80 year old F with PMH of ILD/chronic hypoxic RF on 2 L at night, DM-2 with neuropathy, chronic back pain/spinal stenosis with radiculopathy, diastolic CHF, CKD-2, HTN, hypothyroidism, RA on prednisone, anxiety and depression brought to ED by EMS due to increased confusion and poor p.o. intake for 1 day.  Reportedly had her morphine increased by pain management clinic recently, and has been sleepier and not eating well since then.  Also had abdominal pain, loose stools and decreased urine output for 3 to 4 days.   In ED, febrile to 100.5.  WBC 9.5.  Lactic acid normal.  Cr 2.2 (baseline 0.7-0.8).  K3.1.  Mg 1.6.  BNP and ammonia normal.  CXR consistent with underlying ILD and unchanged from baseline.  CT head without acute intracranial finding.  UA with large LE, small Hgb and rare bacteria.  VBG 7.37/66/98/38.  Repeat ABG 4 hours later 7.46/46/70/33.  Cultures obtained.  Received IV boluses.  Started on IVF and IV ceftriaxone for possible sepsis due to possible UTI, and admitted for observation.  The next day, AKI and encephalopathy resolved.  Slightly tachycardic at rest.  Blood cultures negative.  Therapy recommended SNF.  Subjective: Seen and examined earlier this morning.  No major events overnight of this morning other than mild tachycardia at rest that has gone up to 140's while working with therapy.  No complaints other than her chronic back pain and radiculopathy.  She denies UTI symptoms including dysuria, frequency or urgency.  Denies chest pain, dyspnea, nausea, vomiting or abdominal pain.  Extensive discussion about patient's pain medication and risk for polypharmacy especially in the setting of AKI  with patient and patient's daughter.  Objective: Vitals:   03/31/21 0445 03/31/21 0813 03/31/21 1023 03/31/21 1151  BP: 128/65 126/68 (!) 149/94 138/81  Pulse: (!) 110 (!) 115 (!) 115 (!) 112  Resp: _0 Temp: 98.9 F (37.2 C) 98.7 F (37.1 C)  98.8 F (37.1 C)  TempSrc: Oral Oral  Oral  SpO2: 97% 96% 95% 98%  Weight: 103.2 kg       Intake/Output Summary (Last 24 hours) at 03/31/2021 1159 Last data filed at 03/31/2021 0446 Gross per 24 hour  Intake 2054.46 ml  Output 1025 ml  Net 1029.46 ml   Filed Weights   03/31/21 0445  Weight: 103.2 kg    Examination:  GENERAL: No apparent distress.  Nontoxic. HEENT: MMM.  Vision and hearing grossly intact.  NECK: Supple.  No apparent JVD.  RESP: 98% on 2 L.  No IWOB.  Fair aeration bilaterally but limited exam due to body habitus.. CVS:  RRR. Heart sounds normal.  ABD/GI/GU: BS+. Abd soft, NTND.  MSK/EXT:  Moves extremities. No apparent deformity. No edema.  SKIN: no apparent skin lesion or wound NEURO: Awake, alert and oriented appropriately.  Chronic LLE weakness from arthritis and spinal stenosis. PSYCH: Calm. Normal affect.   Procedures:  None  Microbiology summarized: [id number] and influenza PCR nonreactive. Blood culture NGTD Urine culture pending.  Assessment & Plan: Acute toxic and metabolic encephalopathy-likely from polypharmacy in the setting of renal failure and possible severe sepsis due to UTI.  Patient is on MS Contin, MS IR, gabapentin  and Flexeril.  It seems MS Contin was added 3 days prior to admission.  Encephalopathy resolved -Extensive discussion about her risk for polypharmacy. -I suggested cutting down on her pain medication and reviewed the minimum to take the pain age off.  -Daughter is in agreement with this.  It is a very difficult situation for her.  Not able to get surgery due to her lung lesion.  Physical activity limited by pain.  Only option is watching her diet and PT -Encouraged to  discuss different pain meds that are not renal dependent for clearance with her pain specialist. -Holding MSIR -Reorientation and delirium precautions.  Respiratory acidosis: seems compensated.  Patient is on oxygen and opiates could contribute.  She says she tested negative for sleep apnea.  Resolved. -Would be cautious with pain medication -Wean oxygen as able -IS/OOB/PT/OT  AKI-likely prerenal insult from poor p.o. intake along with concurrent use of diuretics and ARB.  She denies NSAID use.  AKI resolved. Recent Labs    09/26/20 1355 11/08/20 1122 01/16/21 1535 01/16/21 1557 01/17/21 0356 01/18/21 0324 01/19/21 0211 01/24/21 1156 03/30/21 1200 03/31/21 0639  BUN _0 27* _1 CREATININE 0.97 0.96 1.61* 1.60* 1.05* 0.78 0.79 0.78 2.22* 0.88  -Continue monitoring.  Pyuria: UA with large LE.  She had mild fever to 100.5 on presentation.  Currently doubt UTI without UTI symptoms.  We have alternative explanation for her encephalopathy.  Blood cultures NGTD.  -Okay to complete CTX for 3 days  Addendum Urine culture with Klebsiella pneumonia.  Previous urine culture in 2020 the same in 2020 and was sensitive to ceftriaxone.  We will continue IV ceftriaxone and follow-up on sensitivity.  At this point, I am inclined to treat her for severe sepsis due to Klebsiella UTI.  She meets criteria with fever, tachycardia, encephalopathy, AKI and thrombocytopenia.  Chronic diastolic CHF: TTE in 06/1192 with LVEF of 60 to 65%.  No mention of diastolic function.  Right systolic function normal.  She is on Lasix and metolazone at home.  Somewhat hypovolemic on arrival.  Appears euvolemic. -Continue holding Lasix and metolazone -Monitor fluid status and respiratory status closely  Essential hypertension/sinus tachycardia: HR in 110s at rest and went up to 140 with ambulation.  Normotensive off home BP meds -Start low-dose metoprolol -We resume home medications as  appropriate.  NIDDM-2 with hyperglycemia and neuropathy Recent Labs  Lab 03/30/21 1211 03/30/21 1752 03/30/21 2113 03/31/21 0816 03/31/21 1141  GLUCAP 112* 97 108* 119* 150*  -Check hemoglobin A1c -Continue current insulin regimen -Continue home gabapentin and a statin.  Chronic back pain/spinal stenosis with radiculopathy/lower extremity weakness-followed by pain clinic.  Not able to have surgery due to he will respiratory failure and ILD.  It seems she was on MSIR before, and MS quarantine was added recently.  No red flags. -Continue home MS Contin, gabapentin and Cymbalta -Add IV fentanyl breakthrough pain and instead of MS IR. -Outpatient follow-up with a pain specialist -Continue bowel regimen for constipation -IS/OOB/PT/OT  History of rheumatoid arthritis -Continue home prednisone, Arava and Plaquenil.   Anxiety and depression -Continue home Cymbalta  At risk for polypharmacy -See discussion above.  Thrombocytopenia: Seems to be acute on chronic.  Likely from sepsis. Recent Labs  Lab 03/30/21 1200 03/31/21 0639  PLT 119* 98*  -Recheck in the morning   Morbid obesity: Reportedly lost about 70 pounds.  Congratulated. Body mass index is 39.05 kg/m.  -Already on Ozempic at home -  Encouraged to continue working on losing weight. -Consult dietitian for teaching     Pressure skin injury: POA Pressure Injury 03/30/21 Ischial tuberosity Right;Left;Posterior Stage 1 -  Intact skin with non-blanchable redness of a localized area usually over a bony prominence. (Active)  03/30/21 1800  Location: Ischial tuberosity  Location Orientation: Right;Left;Posterior  Staging: Stage 1 -  Intact skin with non-blanchable redness of a localized area usually over a bony prominence.  Wound Description (Comments):   Present on Admission: Yes   DVT prophylaxis:  heparin injection 5,000 Units Start: 03/30/21 1700  Code Status: Full code Family Communication: Patient and/or RN.  Available if any question.  Level of care: Telemetry Medical Status is: Observation  The patient will require care spanning > 2 midnights and should be moved to inpatient because: Severe sepsis due to Klebsiella UTI requiring IV antibiotics Ancef disposition  Final disposition: Likely SNF.  Addendum read as above.  Consultants:  None   Sch Meds:  Scheduled Meds:  allopurinol  300 mg Oral Daily   aspirin  325 mg Oral Daily   atorvastatin  10 mg Oral QPM   cholecalciferol  1,000 Units Oral Q lunch   diclofenac Sodium  2 g Topical TID   DULoxetine  60 mg Oral Daily   fluticasone  2 spray Each Nare Daily   gabapentin  600 mg Oral TID   guaiFENesin  1,200 mg Oral BID   heparin  5,000 Units Subcutaneous Q8H   insulin aspart  0-9 Units Subcutaneous TID WC   leflunomide  20 mg Oral Daily   levothyroxine  50 mcg Oral Q0600   metoprolol tartrate  12.5 mg Oral BID   morphine  15 mg Oral Q12H   multivitamin with minerals  1 tablet Oral Daily   pantoprazole  40 mg Oral Daily   predniSONE  10 mg Oral q morning   Continuous Infusions:  cefTRIAXone (ROCEPHIN)  IV Stopped (03/30/21 1434)   PRN Meds:.acetaminophen **OR** acetaminophen, albuterol, ipratropium-albuterol, naphazoline-glycerin, ondansetron **OR** ondansetron (ZOFRAN) IV  Antimicrobials: Anti-infectives (From admission, onward)    Start     Dose/Rate Route Frequency Ordered Stop   03/30/21 1345  cefTRIAXone (ROCEPHIN) 2 g in sodium chloride 0.9 % 100 mL IVPB        2 g 200 mL/hr over 30 Minutes Intravenous Every 24 hours 03/30/21 1338 04/06/21 1344        I have personally reviewed the following labs and images: CBC: Recent Labs  Lab 03/30/21 1200 03/30/21 1248 03/30/21 1638 03/31/21 0639  WBC 9.5  --   --  7.3  NEUTROABS 5.4  --   --   --   HGB 12.3 12.9 11.9* 11.2*  HCT 40.4 38.0 35.0* 36.5  MCV 102.3*  --   --  99.7  PLT 119*  --   --  98*   BMP &GFR Recent Labs  Lab 03/30/21 1200 03/30/21 1248  03/30/21 1336 03/30/21 1638 03/31/21 0639  NA 143 142  --  140 141  K 3.1* 3.3*  --  3.2* 3.6  CL 100  --   --   --  104  CO2 32  --   --   --  29  GLUCOSE 130*  --   --   --  119*  BUN 17  --   --   --  9  CREATININE 2.22*  --   --   --  0.88  CALCIUM 8.2*  --   --   --  7.8*  MG  --   --  1.6*  --  1.8   Estimated Creatinine Clearance: 60.6 mL/min (by C-G formula based on SCr of 0.88 mg/dL). Liver & Pancreas: Recent Labs  Lab 03/30/21 1200  AST 20  ALT 22  ALKPHOS 74  BILITOT 0.8  PROT 5.4*  ALBUMIN 3.0*   No results for input(s): LIPASE, AMYLASE in the last 168 hours. Recent Labs  Lab 03/30/21 1152  AMMONIA <10   Diabetic: No results for input(s): HGBA1C in the last 72 hours. Recent Labs  Lab 03/30/21 1211 03/30/21 1752 03/30/21 2113 03/31/21 0816 03/31/21 1141  GLUCAP 112* 97 108* 119* 150*   Cardiac Enzymes: No results for input(s): CKTOTAL, CKMB, CKMBINDEX, TROPONINI in the last 168 hours. No results for input(s): PROBNP in the last 8760 hours. Coagulation Profile: Recent Labs  Lab 03/30/21 1200  INR 1.0   Thyroid Function Tests: No results for input(s): TSH, T4TOTAL, FREET4, T3FREE, THYROIDAB in the last 72 hours. Lipid Profile: No results for input(s): CHOL, HDL, LDLCALC, TRIG, CHOLHDL, LDLDIRECT in the last 72 hours. Anemia Panel: No results for input(s): VITAMINB12, FOLATE, FERRITIN, TIBC, IRON, RETICCTPCT in the last 72 hours. Urine analysis:    Component Value Date/Time   COLORURINE AMBER (A) 03/30/2021 1200   APPEARANCEUR CLOUDY (A) 03/30/2021 1200   LABSPEC 1.018 03/30/2021 1200   PHURINE 5.0 03/30/2021 1200   GLUCOSEU NEGATIVE 03/30/2021 1200   HGBUR SMALL (A) 03/30/2021 1200   BILIRUBINUR NEGATIVE 03/30/2021 1200   BILIRUBINUR 1+ 01/30/2021 1450   KETONESUR NEGATIVE 03/30/2021 1200   PROTEINUR 30 (A) 03/30/2021 1200   UROBILINOGEN negative (A) 01/30/2021 1450   UROBILINOGEN 0.2 11/28/2019 1424   NITRITE NEGATIVE 03/30/2021 1200    LEUKOCYTESUR LARGE (A) 03/30/2021 1200   Sepsis Labs: Invalid input(s): PROCALCITONIN, [place]  Microbiology: Recent Results (from the past 240 hour(s))  Resp Panel by RT-PCR (Flu A&B, Covid) Nasopharyngeal Swab     Status: None   Collection Time: 03/30/21 11:51 AM   Specimen: Nasopharyngeal Swab; Nasopharyngeal(NP) swabs in vial transport medium  Result Value Ref Range Status   SARS Coronavirus 2 by RT PCR NEGATIVE NEGATIVE Final    Comment: (NOTE) SARS-CoV-2 target nucleic acids are NOT DETECTED.  The SARS-CoV-2 RNA is generally detectable in upper respiratory specimens during the acute phase of infection. The lowest concentration of SARS-CoV-2 viral copies this assay can detect is 138 copies/mL. A negative result does not preclude SARS-Cov-2 infection and should not be used as the sole basis for treatment or other patient management decisions. A negative result may occur with  improper specimen collection/handling, submission of specimen other than nasopharyngeal swab, presence of viral mutation(s) within the areas targeted by this assay, and inadequate number of viral copies(<138 copies/mL). A negative result must be combined with clinical observations, patient history, and epidemiological information. The expected result is Negative.  Fact Sheet for Patients:  EntrepreneurPulse.com.au  Fact Sheet for Healthcare Providers:  IncredibleEmployment.be  This test is no t yet approved or cleared by the [place] FDA and  has been authorized for detection and/or diagnosis of SARS-CoV-2 by FDA under an Emergency Use Authorization (EUA). This EUA will remain  in effect (meaning this test can be used) for the duration of the COVID-19 declaration under Section 564(b)(1) of the Act, 21 U.S.C.section 360bbb-3(b)(1), unless the authorization is terminated  or revoked sooner.       Influenza A by PCR NEGATIVE NEGATIVE Final   Influenza B  by PCR NEGATIVE  NEGATIVE Final    Comment: (NOTE) The Xpert Xpress SARS-CoV-2/FLU/RSV plus assay is intended as an aid in the diagnosis of influenza from Nasopharyngeal swab specimens and should not be used as a sole basis for treatment. Nasal washings and aspirates are unacceptable for Xpert Xpress SARS-CoV-2/FLU/RSV testing.  Fact Sheet for Patients: EntrepreneurPulse.com.au  Fact Sheet for Healthcare Providers: IncredibleEmployment.be  This test is not yet approved or cleared by the [place] FDA and has been authorized for detection and/or diagnosis of SARS-CoV-2 by FDA under an Emergency Use Authorization (EUA). This EUA will remain in effect (meaning this test can be used) for the duration of the COVID-19 declaration under Section 564(b)(1) of the Act, 21 U.S.C. section 360bbb-3(b)(1), unless the authorization is terminated or revoked.  Performed at [facility], [place] [address], [place], [place] [address]   Blood Culture (routine x 2)     Status: None (Preliminary result)   Collection Time: 03/30/21 12:00 PM   Specimen: BLOOD  Result Value Ref Range Status   Specimen Description BLOOD LEFT ANTECUBITAL  Final   Special Requests   Final    BOTTLES DRAWN AEROBIC AND ANAEROBIC Blood Culture adequate volume   Culture   Final    NO GROWTH < 24 HOURS Performed at [facility], [place] [address], [place], [place] [address]    Report Status PENDING  Incomplete  Blood Culture (routine x 2)     Status: None (Preliminary result)   Collection Time: 03/30/21 12:00 PM   Specimen: BLOOD  Result Value Ref Range Status   Specimen Description BLOOD RIGHT ANTECUBITAL  Final   Special Requests   Final    BOTTLES DRAWN AEROBIC AND ANAEROBIC Blood Culture results may not be optimal due to an inadequate volume of blood received in culture bottles   Culture   Final    NO GROWTH < 24 HOURS Performed at [facility], [place] [address],  [place], [place] [address]    Report Status PENDING  Incomplete    Radiology Studies: CT ABDOMEN PELVIS WO CONTRAST  Result Date: 03/31/2021 CLINICAL DATA:  Abdominal pain, sepsis EXAM: CT ABDOMEN AND PELVIS WITHOUT CONTRAST TECHNIQUE: Multidetector CT imaging of the abdomen and pelvis was performed following the standard protocol without IV contrast. COMPARISON:  CTA abdomen/pelvis dated 01/16/2021 FINDINGS: Lower chest: Emphysematous changes at the lung bases. Hepatobiliary: Unenhanced liver is grossly unremarkable. Layering gallbladder sludge (series 3/image 27), without associated inflammatory changes. No intrahepatic or extrahepatic duct dilatation. Pancreas: Within normal limits. Spleen: Within normal limits. Adrenals/Urinary Tract: Adrenal glands are within normal limits. Bilateral renal cysts, including a 12 mm hemorrhagic cyst in the medial left upper kidney (series 3/image 31) and simple cysts measuring up to 5.3 cm in the lateral right lower kidney (series 3/image 46). Three nonobstructing right lower pole renal calculi measuring up to 2 mm. No hydronephrosis. Bladder is notable for trace nondependent gas but is otherwise within normal limits. Stomach/Bowel: Stomach is within normal limits. No evidence of bowel obstruction. Appendix is not discretely visualized. No colonic wall thickening or inflammatory changes. Vascular/Lymphatic: No evidence of abdominal aortic aneurysm. Atherosclerotic calcifications of the abdominal aorta and branch vessels. To lymph nodes Reproductive: Status post hysterectomy. No adnexal masses. Other: No abdominopelvic ascites. Musculoskeletal: Degenerative changes of the visualized thoracolumbar spine. Status post ORIF of the left hip. IMPRESSION: No CT findings to account for the patient's abdominal pain. Gallbladder sludge, without associated inflammatory changes. Three nonobstructing right lower pole renal calculi  measure up to 2 mm. No hydronephrosis. Additional ancillary  findings as above. Electronically Signed   By: Julian Hy M.D.   On: 03/31/2021 01:01   CT HEAD WO CONTRAST (5MM)  Result Date: 03/30/2021 CLINICAL DATA:  Mental status change EXAM: CT HEAD WITHOUT CONTRAST TECHNIQUE: Contiguous axial images were obtained from the base of the skull through the vertex without intravenous contrast. COMPARISON:  Head CT dated January 16, 2021 FINDINGS: Brain: Chronic white matter ischemic change. No evidence of acute infarction, hemorrhage, hydrocephalus, extra-axial collection or mass lesion/mass effect. Vascular: No hyperdense vessel or unexpected calcification. Skull: Normal. Negative for fracture or focal lesion. Sinuses/Orbits: No acute finding. Other: None. IMPRESSION: No acute intracranial abnormality. Electronically Signed   By: Yetta Glassman M.D.   On: 03/30/2021 12:36   DG Chest Port 1 View  Result Date: 03/30/2021 CLINICAL DATA:  80 year old female with possible sepsis. EXAM: PORTABLE CHEST 1 VIEW COMPARISON:  Chest x-ray 01/16/2021. FINDINGS: Lung volumes are low. Widespread areas of interstitial prominence and diffuse peribronchial cuffing noted in the lungs bilaterally, most severe there out the mid to lower lungs. No definite confluent consolidative airspace disease. No pleural effusions. No pneumothorax. Crowding of the pulmonary vasculature. No evidence of pulmonary edema. Heart size is normal. Mediastinal contours are distorted by patient positioning. IMPRESSION: 1. The appearance the chest is very similar to the prior chest radiograph, suggestive of interstitial lung disease. Outpatient referral to Pulmonology for further clinical evaluation is recommended. Additionally, follow-up nonemergent high-resolution chest CT is recommended in the near future to better evaluate these findings. Electronically Signed   By: Vinnie Langton M.D.   On: 03/30/2021 12:27    60 minutes with more than 50% spent in reviewing records, counseling patient/family and  coordinating care.   Dixon Luczak T. Calloway  If 7PM-7AM, please contact night-coverage [email or web address] 03/31/2021, 11:59 AM

## 2021-03-31 NOTE — Evaluation (Signed)
Occupational Therapy Evaluation Patient Details Name: [name] MRN: [id number] DOB: [birth year] Today's Date: 03/31/2021   History of Present Illness 80 yo female has AKI with UTI(+) sepsis.pt currently tachycardic .  PMH COPD CHF anemia arthritis anxiety DMII obese   Clinical Impression   PT admitted with sepsis. Pt currently with functional limitiations due to the deficits listed below (see OT problem list). Pt currently tachycardic during session HR sustained 140s and at rest 120s. Pt requires oxygen 2L  with cues to position correctly in nose. Pt self reporting need for rehab and asking for it after session. Pt requires (A) for bed mobility.  Pt will benefit from skilled OT to increase their independence and safety with adls and balance to allow discharge SNF.       Recommendations for follow up therapy are one component of a multi-disciplinary discharge planning process, led by the attending physician.  Recommendations may be updated based on patient status, additional functional criteria and insurance authorization.   Follow Up Recommendations  Skilled nursing-short term rehab (<3 hours/day)    Assistance Recommended at Discharge    Functional Status Assessment  Patient has had a recent decline in their functional status and demonstrates the ability to make significant improvements in function in a reasonable and predictable amount of time.  Equipment Recommendations  None recommended by OT    Recommendations for Other Services PT consult     Precautions / Restrictions Precautions Precautions: Fall      Mobility Bed Mobility               General bed mobility comments: sitting eob on arrival requesting toileting    Transfers Overall transfer level: Needs assistance                        Balance Overall balance assessment: Mild deficits observed, not formally tested                                         ADL either  performed or assessed with clinical judgement   ADL Overall ADL's : Needs assistance/impaired Eating/Feeding: Set up   Grooming: Min guard;Standing Grooming Details (indicate cue type and reason): fatigues quickly elevated HR Upper Body Bathing: Min guard   Lower Body Bathing: Moderate assistance   Upper Body Dressing : Min guard   Lower Body Dressing: Moderate assistance Lower Body Dressing Details (indicate cue type and reason): difficulty donning socks. OT cutting socks to have widen opening due to patient states "theya re cutting a little" Toilet Transfer: Moderate assistance;Regular Toilet;Grab bars;Rolling walker (2 wheels) Toilet Transfer Details (indicate cue type and reason): pt leaning on RW with forearms requires (A) to navigate RW and position. Pt requires (A) to descend and ascend. pt cued to use grab bar as needed Toileting- Clothing Manipulation and Hygiene: Min guard;Sitting/lateral lean Toileting - Clothing Manipulation Details (indicate cue type and reason): peri hygiene     Functional mobility during ADLs: Moderate assistance;Rolling walker (2 wheels) General ADL Comments: pt uses rollator at home baseline inside. pt uses w/c out of home and it is kept in the car. pt self reports fatigue and need for SNF     Vision Baseline Vision/History: 1 Wears glasses Ability to See in Adequate Light: 1 Impaired Patient Visual Report: No change from baseline       Perception  Praxis      Pertinent Vitals/Pain Pain Assessment: Faces Faces Pain Scale: Hurts even more Pain Location: right hip area Pain Descriptors / Indicators: Sore Pain Intervention(s): Monitored during session;Repositioned     Hand Dominance Right   Extremity/Trunk Assessment Upper Extremity Assessment Upper Extremity Assessment: Generalized weakness;RUE deficits/detail RUE Deficits / Details: not grasping the RW and rather leaning on forearm   Lower Extremity Assessment Lower Extremity  Assessment: Defer to PT evaluation   Cervical / Trunk Assessment Cervical / Trunk Assessment: Kyphotic   Communication Communication Communication: No difficulties   Cognition Arousal/Alertness: Awake/alert Behavior During Therapy: Flat affect Overall Cognitive Status: Within Functional Limits for tasks assessed                                 General Comments: slow word retrieval . pt states "i am trying to tell you " pausing  and then asking daughter to speak for her as patient never produced the word or finished the thought. pt reports feeling therapy at d/c is needed     General Comments  HR 140s with adls and sustaining 811 with rest. pt requires Vincent 2 L to sustain >90% 02 . provided energy conservation handout and reviewed with pt and daughter present    Exercises     Shoulder Instructions      Home Living Family/patient expects to be discharged to:: Private residence Living Arrangements: Spouse/significant other;Children (son) Available Help at Discharge: Family;Personal care attendant;Available 24 hours/day Type of Home: House Home Access: Stairs to enter [employer] of Steps: 1 Entrance Stairs-Rails: None Home Layout: Two level;Able to live on main level with bedroom/bathroom     Bathroom Shower/Tub: Occupational psychologist: Handicapped height Bathroom Accessibility: Yes   Home Equipment: Conservation officer, nature (2 wheels);Wheelchair - manual;BSC/3in1;Cane - single point;Hand held Chief Technology Officer (4 wheels);Grab bars - tub/shower;Shower seat          Prior Functioning/Environment Prior Level of Function : Needs assist  Cognitive Assist : ADLs (cognitive)   ADLs (Cognitive): Set up cues Physical Assist : Mobility (physical) Mobility (physical): Transfers;Gait              OT Problem List: Decreased strength;Decreased activity tolerance;Impaired balance (sitting and/or standing);Decreased coordination;Decreased  cognition;Decreased safety awareness;Decreased knowledge of use of DME or AE;Decreased knowledge of precautions;Cardiopulmonary status limiting activity;Obesity;Pain      OT Treatment/Interventions: Self-care/ADL training;Therapeutic exercise;Energy conservation;DME and/or AE instruction;Therapeutic activities;Balance training;Patient/family education;Cognitive remediation/compensation    OT Goals(Current goals can be found in the care plan section) Acute Rehab OT Goals Patient Stated Goal: to get rehab OT Goal Formulation: With patient Time For Goal Achievement: 04/14/21 Potential to Achieve Goals: Good  OT Frequency: Min 2X/week   Barriers to D/C:    spouse and son to (A) PRN       Co-evaluation              AM-PAC OT "6 Clicks" Daily Activity     Outcome Measure Help from another person eating meals?: None Help from another person taking care of personal grooming?: None Help from another person toileting, which includes using toliet, bedpan, or urinal?: A Little Help from another person bathing (including washing, rinsing, drying)?: A Little Help from another person to put on and taking off regular upper body clothing?: A Little Help from another person to put on and taking off regular lower body clothing?: A Lot 6 Click Score: 19  End of Session Equipment Utilized During Treatment: Rolling walker (2 wheels);Oxygen Nurse Communication: Mobility status;Precautions  Activity Tolerance: Patient tolerated treatment well Patient left: in bed;with call bell/phone within reach;with family/visitor present  OT Visit Diagnosis: Unsteadiness on feet (R26.81);Muscle weakness (generalized) (M62.81);Pain Pain - Right/Left: Right Pain - part of body: Hip                Time: 5872-7618 OT Time Calculation (min): 25 min Charges:  OT General Charges $OT Visit: 1 Visit OT Evaluation $OT Eval Moderate Complexity: 1 Mod   Brynn, OTR/L  Acute Rehabilitation Services Pager:  [phone] Office: [phone] .   [name] Modena 03/31/2021, 11:39 AM

## 2021-03-31 NOTE — Progress Notes (Signed)
   03/31/21 0813  Assess: MEWS Score  Temp 98.7 F (37.1 C)  BP 126/68  Pulse Rate (!) 115  Resp 18  SpO2 96 %  O2 Device Nasal Cannula  Assess: MEWS Score  MEWS Temp 0  MEWS Systolic 0  MEWS Pulse 2  MEWS RR 0  MEWS LOC 0  MEWS Score 2  MEWS Score Color Yellow  Assess: if the MEWS score is Yellow or Red  Focused Assessment No change from prior assessment  Early Detection of Sepsis Score *See Row Information* Medium  MEWS guidelines implemented *See Row Information* Yes  Treat  MEWS Interventions Other (Comment) (Contacted MD)  Pain Scale 0-10  Pain Score 0  Take Vital Signs  Increase Vital Sign Frequency  Yellow: Q 2hr X 2 then Q 4hr X 2, if remains yellow, continue Q 4hrs  Escalate  MEWS: Escalate Yellow: discuss with charge nurse/RN and consider discussing with provider and RRT  Notify: Charge Nurse/RN  Name of Charge Nurse/RN Notified Leanord Hawking, RN  Date Charge Nurse/RN Notified 03/31/21  Time Charge Nurse/RN Notified 0813  Notify: Provider  Provider Name/Title Dr. Cyndia Skeeters  Date Provider Notified 03/31/21  Time Provider Notified [phone]  Notification Type Page  Notification Reason Other (Comment) (HR remains elevated)  Provider response No new orders  Patient's HR remains elevated. Dr Cyndia Skeeters notified, patient in no distress at this time. Will continue to monitor.

## 2021-03-31 NOTE — Evaluation (Signed)
Physical Therapy Evaluation Patient Details Name: [name] MRN: [id number] DOB: Jun 20, 1940 Today's Date: 03/31/2021  History of Present Illness  80 yo female has AKI with UTI(+) sepsis.pt currently tachycardic .  PMH COPD CHF anemia arthritis anxiety DMII obese  Clinical Impression   Pt admitted secondary to problem above with deficits below. PTA patient was independent with all mobility inside home (bed mobility, transfers, walking with rollator).  Pt currently requires overall min assist for these same mobility tasks and was limited in ambulation distance by dyspnea (only 15 ft).  O2 sats with ?decrease as poor pleth while pt holding onto RW handle. After seated rest, good pleth indicated 93% and increased to 99% with further rest. Patient shows a decline from her previously independent mobility status and anticipate she can make steady gains to regain her independence. Anticipate patient will benefit from PT to address problems listed below.Will continue to follow acutely to maximize functional mobility independence and safety.          Recommendations for follow up therapy are one component of a multi-disciplinary discharge planning process, led by the attending physician.  Recommendations may be updated based on patient status, additional functional criteria and insurance authorization.  Follow Up Recommendations Skilled nursing-short term rehab (<3 hours/day)    Assistance Recommended at Discharge Frequent or constant Supervision/Assistance  Functional Status Assessment Patient has had a recent decline in their functional status and demonstrates the ability to make significant improvements in function in a reasonable and predictable amount of time.  Equipment Recommendations  None recommended by PT    Recommendations for Other Services       Precautions / Restrictions Precautions Precautions: Fall      Mobility  Bed Mobility Overal bed mobility: Needs Assistance Bed  Mobility: Rolling;Sidelying to Sit Rolling: Modified independent (Device/Increase time) (uses rail) Sidelying to sit: Min guard;HOB elevated (HOB 20 degrees, plus rail)       General bed mobility comments: pt requires incr time and effort to get to upright sitting; reported feeling lightheaded (not normal for her)    Transfers Overall transfer level: Needs assistance Equipment used: Rolling walker (2 wheels) Transfers: Sit to/from Stand Sit to Stand: Min assist           General transfer comment: boosting assist due to weakness    Ambulation/Gait Ambulation/Gait assistance: Min guard Gait Distance (Feet): 15 Feet Assistive device: Rolling walker (2 wheels) Gait Pattern/deviations: Step-through pattern;Decreased stride length;Trunk flexed   Gait velocity interpretation: <1.8 ft/sec, indicate of risk for recurrent falls   General Gait Details: pt forward flexed/kyphotic but can partially correct with cues; more dysneic than usual per patient with O2 sats 93-99% on 2L with HR max 115 bpm; poor awareness of O2 tubing (which she uses at home) and completely wrapped herself up in tubing as turning to sit  Stairs            Wheelchair Mobility    Modified Rankin (Stroke Patients Only)       Balance Overall balance assessment: Mild deficits observed, not formally tested (due to generalized fatigue and dyspnea after ambulation)                                           Pertinent Vitals/Pain Pain Assessment: 0-10 Faces Pain Scale: Hurts even more Pain Location: right hip area Pain Descriptors / Indicators: Sore Pain Intervention(s): Limited  activity within patient's tolerance;Monitored during session    [place] expects to be discharged to:: Private residence Living Arrangements: Spouse/significant other;Children (son) Available Help at Discharge: Family;Personal care attendant;Available 24 hours/day Type of Home: House Home  Access: Stairs to enter Entrance Stairs-Rails: None Entrance Stairs-Number of Steps: 1   Home Layout: Two level;Able to live on main level with bedroom/bathroom Home Equipment: Rolling Walker (2 wheels);Wheelchair - manual;BSC/3in1;Cane - single point;Hand held Chief Technology Officer (4 wheels);Grab bars - tub/shower;Shower seat      Prior Function Prior Level of Function : Needs assist  Cognitive Assist : ADLs (cognitive)   ADLs (Cognitive): Set up cues Physical Assist : Mobility (physical) Mobility (physical): Transfers;Gait   Mobility Comments: states she walks with rollator inside home; can walk from back of house to front and typically does that several times per day; uses bathroom never BSC ADLs Comments: bathes and dresses herself, someone else fixes her meals     Hand Dominance   Dominant Hand: Right    Extremity/Trunk Assessment   Upper Extremity Assessment Upper Extremity Assessment: Defer to OT evaluation    Lower Extremity Assessment Lower Extremity Assessment: Generalized weakness;RLE deficits/detail;LLE deficits/detail RLE Deficits / Details: edematous and tender to touch LLE Deficits / Details: edematous and tender to touch    Cervical / Trunk Assessment Cervical / Trunk Assessment: Kyphotic  Communication   Communication: No difficulties  Cognition Arousal/Alertness: Awake/alert Behavior During Therapy: WFL for tasks assessed/performed Overall Cognitive Status: Within Functional Limits for tasks assessed                                 General Comments: oriented except for day of week (1 day behind) and year of birth (gave 10 instead of 72)        General Comments      Exercises     Assessment/Plan    PT Assessment Patient needs continued PT services  PT Problem List Decreased strength;Decreased activity tolerance;Decreased balance;Decreased mobility;Decreased cognition;Decreased knowledge of use of DME;Decreased safety  awareness;Cardiopulmonary status limiting activity;Obesity;Pain       PT Treatment Interventions DME instruction;Gait training;Stair training;Functional mobility training;Therapeutic activities;Therapeutic exercise;Balance training;Patient/family education    PT Goals (Current goals can be found in the Care Plan section)  Acute Rehab PT Goals Patient Stated Goal: to get stronger before going home; wants to be able to do for herself as she did prior to recent illness PT Goal Formulation: With patient Time For Goal Achievement: 04/14/21 Potential to Achieve Goals: Good    Frequency Min 2X/week   Barriers to discharge        Co-evaluation               AM-PAC PT "6 Clicks" Mobility  Outcome Measure Help needed turning from your back to your side while in a flat bed without using bedrails?: A Little Help needed moving from lying on your back to sitting on the side of a flat bed without using bedrails?: A Lot Help needed moving to and from a bed to a chair (including a wheelchair)?: A Little Help needed standing up from a chair using your arms (e.g., wheelchair or bedside chair)?: A Little Help needed to walk in hospital room?: A Little Help needed climbing 3-5 steps with a railing? : Total 6 Click Score: 15    End of Session Equipment Utilized During Treatment: Oxygen Activity Tolerance: Patient limited by fatigue;Treatment limited secondary to medical  complications (Comment) (dyspnea) Patient left: in chair;with call bell/phone within reach;with chair alarm set;with family/visitor present Nurse Communication: Mobility status;Other (comment) (O2 sats remained in 90s with gait) PT Visit Diagnosis: Unsteadiness on feet (R26.81);Muscle weakness (generalized) (M62.81);Difficulty in walking, not elsewhere classified (R26.2)    Time: 9326-7124 PT Time Calculation (min) (ACUTE ONLY): 38 min   Charges:   PT Evaluation $PT Eval Moderate Complexity: 1 Mod PT Treatments $Gait  Training: 8-22 mins         Arby Barrette, PT Acute Rehabilitation Services  Pager [phone] Office [phone]   Rexanne Mano 03/31/2021, 2:09 PM

## 2021-04-01 DIAGNOSIS — I5033 Acute on chronic diastolic (congestive) heart failure: Secondary | ICD-10-CM

## 2021-04-01 LAB — RENAL FUNCTION PANEL
Albumin: 2.5 g/dL — ABNORMAL LOW (ref 3.5–5.0)
Anion gap: 9 (ref 5–15)
BUN: 7 mg/dL — ABNORMAL LOW (ref 8–23)
CO2: 28 mmol/L (ref 22–32)
Calcium: 8.1 mg/dL — ABNORMAL LOW (ref 8.9–10.3)
Chloride: 103 mmol/L (ref 98–111)
Creatinine, Ser: 0.67 mg/dL (ref 0.44–1.00)
GFR, Estimated: >60 mL/min (ref >60)
Glucose, Bld: 139 mg/dL — ABNORMAL HIGH (ref 70–99)
Phosphorus: 1.9 mg/dL — ABNORMAL LOW (ref 2.5–4.6)
Potassium: 4.8 mmol/L (ref 3.5–5.1)
Sodium: 140 mmol/L (ref 135–145)

## 2021-04-01 LAB — CK: Total CK: 47 U/L (ref 38–234)

## 2021-04-01 LAB — BRAIN NATRIURETIC PEPTIDE: B Natriuretic Peptide: 25.2 pg/mL (ref 0.0–100.0)

## 2021-04-01 LAB — CBC
HCT: 36.3 % (ref 36.0–46.0)
Hemoglobin: 11.1 g/dL — ABNORMAL LOW (ref 12.0–15.0)
MCH: 30.5 pg (ref 26.0–34.0)
MCHC: 30.6 g/dL (ref 30.0–36.0)
MCV: 99.7 fL (ref 80.0–100.0)
Platelets: 106 10*3/uL — ABNORMAL LOW (ref 150–400)
RBC: 3.64 MIL/uL — ABNORMAL LOW (ref 3.87–5.11)
RDW: 14.3 % (ref 11.5–15.5)
WBC: 7.4 10*3/uL (ref 4.0–10.5)
nRBC: 0 % (ref 0.0–0.2)

## 2021-04-01 LAB — GLUCOSE, CAPILLARY
Glucose-Capillary: 126 mg/dL — ABNORMAL HIGH (ref 70–99)
Glucose-Capillary: 112 mg/dL — ABNORMAL HIGH (ref 70–99)
Glucose-Capillary: 184 mg/dL — ABNORMAL HIGH (ref 70–99)
Glucose-Capillary: 187 mg/dL — ABNORMAL HIGH (ref 70–99)

## 2021-04-01 LAB — TROPONIN I (HIGH SENSITIVITY)
Troponin I (High Sensitivity): 12 ng/L (ref <18)
Troponin I (High Sensitivity): 13 ng/L (ref <18)

## 2021-04-01 LAB — MAGNESIUM: Magnesium: 1.9 mg/dL (ref 1.7–2.4)

## 2021-04-01 MED ORDER — LINACLOTIDE 72 MCG PO CAPS
72.0000 ug | ORAL_CAPSULE | Freq: Every day | ORAL | Status: DC
Start: 2021-04-02 — End: 2021-04-03
  Administered 2021-04-02 – 2021-04-03 (×2): 72 ug via ORAL
  Filled 2021-04-01 (×3): qty 1

## 2021-04-01 MED ORDER — ENSURE ENLIVE PO LIQD
237.0000 mL | ORAL | Status: DC
Start: 2021-04-02 — End: 2021-04-03
  Administered 2021-04-02: 237 mL via ORAL

## 2021-04-01 MED ORDER — SODIUM PHOSPHATES 45 MMOLE/15ML IV SOLN
15.0000 mmol | Freq: Once | INTRAVENOUS | Status: AC
  Administered 2021-04-01: 15 mmol via INTRAVENOUS
  Filled 2021-04-01: qty 5

## 2021-04-01 MED ORDER — METOPROLOL SUCCINATE ER 100 MG PO TB24
100.0000 mg | ORAL_TABLET | Freq: Every day | ORAL | Status: DC
  Administered 2021-04-01 – 2021-04-03 (×3): 100 mg via ORAL
  Filled 2021-04-01 (×3): qty 1

## 2021-04-01 MED ORDER — HYDROXYCHLOROQUINE SULFATE 200 MG PO TABS
200.0000 mg | ORAL_TABLET | Freq: Every day | ORAL | Status: DC
  Administered 2021-04-01 – 2021-04-03 (×3): 200 mg via ORAL
  Filled 2021-04-01 (×3): qty 1

## 2021-04-01 MED ORDER — FUROSEMIDE 10 MG/ML IJ SOLN
60.0000 mg | Freq: Once | INTRAMUSCULAR | Status: AC
  Administered 2021-04-01: 60 mg via INTRAVENOUS
  Filled 2021-04-01: qty 6

## 2021-04-01 MED ORDER — FUROSEMIDE 40 MG PO TABS
80.0000 mg | ORAL_TABLET | Freq: Every day | ORAL | Status: DC
  Administered 2021-04-02 – 2021-04-03 (×2): 80 mg via ORAL
  Filled 2021-04-01 (×2): qty 2

## 2021-04-01 MED ORDER — IRBESARTAN 150 MG PO TABS
150.0000 mg | ORAL_TABLET | Freq: Every day | ORAL | Status: DC
  Administered 2021-04-01 – 2021-04-03 (×3): 150 mg via ORAL
  Filled 2021-04-01 (×3): qty 1

## 2021-04-01 MED ORDER — MORPHINE SULFATE 15 MG PO TABS
15.0000 mg | ORAL_TABLET | Freq: Two times a day (BID) | ORAL | Status: DC | PRN
  Administered 2021-04-01 – 2021-04-03 (×2): 15 mg via ORAL
  Filled 2021-04-01 (×2): qty 1

## 2021-04-01 NOTE — TOC Initial Note (Signed)
Transition of Care [facility]) - Initial/Assessment Note    Patient Details  Name: [name] MRN: [id number] Date of Birth: 1941/05/01  Transition of Care [facility]) CM/SW Contact:    Joan Reeve, LCSW Phone Number: 04/01/2021, 4:27 PM  Clinical Narrative:                  CSW received SNF consult. CSW met with pt at bedside. CSW introduced self and explained role at the hospital. Pt reports that PTA she lived at home with her husband. Pt reports she used a walker sometimes and was independent with mobility and ADL's. Pt had someone come in to do house cleaning.   CSW reviewed PT/OT recommendations for SNF. Pt reports she is fine with SNF if it will get her stronger. Pt gave CSW permission to fax out to facilities in the area. Pt has no preference of facility at this time. CSW gave pt medicare.gov rating list to review. CSW explained insurance auth process. Pt reports they are covid vaccinated.  CSW will continue to follow.   Expected Discharge Plan: Skilled Nursing Facility Barriers to Discharge: Continued Medical Work up   Patient Goals and CMS Choice Patient states their goals for this hospitalization and ongoing recovery are:: to get stronger CMS Medicare.gov Compare Post Acute Care list provided to:: Patient Choice offered to / list presented to : Patient  Expected Discharge Plan and Services Expected Discharge Plan: [place] arrangements for the past 2 months: [place] Expected Discharge Date: 04/01/21                                    Prior Living Arrangements/Services Living arrangements for the past 2 months: [place] Lives with:: Spouse Patient language and need for interpreter reviewed:: Yes Do you feel safe going back to the place where you live?: Yes      Need for Family Participation in Patient Care: Yes (Comment) Care giver support system in place?: Yes (comment)   Criminal  Activity/Legal Involvement Pertinent to Current Situation/Hospitalization: No - Comment as needed  Activities of Daily Living Home Assistive Devices/Equipment: None ADL Screening (condition at time of admission) Patient's cognitive ability adequate to safely complete daily activities?: No Is the patient deaf or have difficulty hearing?: No Does the patient have difficulty seeing, even when wearing glasses/contacts?: No Does the patient have difficulty concentrating, remembering, or making decisions?: Yes Patient able to express need for assistance with ADLs?: Yes Does the patient have difficulty dressing or bathing?: Yes Independently performs ADLs?: No Communication: Independent Dressing (OT): Needs assistance Is this a change from baseline?: Change from baseline, expected to last <3days Grooming: Needs assistance Is this a change from baseline?: Change from baseline, expected to last <3 days Feeding: Independent Bathing: Needs assistance Is this a change from baseline?: Change from baseline, expected to last <3 days In/Out Bed: Needs assistance Is this a change from baseline?: Change from baseline, expected to last <3 days Does the patient have difficulty walking or climbing stairs?: Yes Weakness of Legs: Both Weakness of Arms/Hands: Both  Permission Sought/Granted Permission sought to share information with : Facility Art therapist granted to share information with : Yes, Verbal Permission Granted     Permission granted to share info w AGENCY: SNF        Emotional Assessment Appearance:: Appears stated age Attitude/Demeanor/Rapport: Engaged  Affect (typically observed): Appropriate Orientation: : Oriented to Self, Oriented to Place, Oriented to  Time, Oriented to Situation Alcohol / Substance Use: Not Applicable Psych Involvement: No (comment)  Admission diagnosis:  Sepsis due to urinary tract infection ([place]) [A41.9, N39.0] Sepsis with acute renal  failure without septic shock, due to unspecified organism, unspecified acute renal failure type ([place]) [A41.9, R65.20, N17.9] UTI (urinary tract infection) [N39.0] Patient Active Problem List   Diagnosis Date Noted   Pressure injury of skin 03/31/2021   UTI (urinary tract infection) 03/31/2021   Sepsis due to urinary tract infection ([place]) 03/30/2021   Acute encephalopathy 03/30/2021   Thrombocytopenia (HCC) 03/30/2021   Diabetic neuropathy (HCC) 05/24/2019   Hypokalemia 04/21/2019   Anticoagulated 06/25/2018   Chronic pain 06/25/2018   CRI (chronic renal insufficiency), stage 3 (moderate) (HCC) 06/25/2018   Drug-induced constipation 06/18/2018   DVT (deep venous thrombosis) (HCC) 06/03/2018   Primary osteoarthritis of both knees 05/26/2018   Body mass index (BMI) of 50-59.9 in adult ([place]) 05/26/2018   Acquired hypothyroidism 05/26/2018   Chronic bilateral low back pain without sciatica 02/17/2018   Astigmatism with presbyopia, bilateral 03/26/2017   Cortical age-related cataract of both eyes 03/26/2017   Family history of glaucoma 03/26/2017   Long term current use of oral hypoglycemic drug 03/26/2017   Nuclear sclerotic cataract of both eyes 03/26/2017   Pain in left hip 02/18/2017   Closed nondisplaced intertrochanteric fracture of left femur with delayed healing 01/14/2017   Gout 11/29/2016   Depression 11/29/2016   Closed left hip fracture (HCC) 11/29/2016   GERD (gastroesophageal reflux disease) 11/29/2016   Closed intertrochanteric fracture of hip, left, initial encounter ([place]) 11/29/2016   Physical deconditioning 07/31/2015   Pulmonary fibrosis, postinflammatory (HCC) 07/31/2015   Bronchiectasis without complication ([place]) 77/41/4239   Tendinitis of left rotator cuff 06/13/2015   Chronic diastolic CHF (congestive heart failure) ([place]) 04/21/2015   Acute kidney injury superimposed on chronic kidney disease ([place]) 04/17/2015   Chronic respiratory failure with hypoxia ([place])  02/09/2015   Dyspnea and respiratory abnormality 01/03/2015   Normal coronary arteries 11/02/2014   Morbid obesity ([place]) 10/30/2014   Dysphagia 08/21/2014   Chronic fatigue 06/21/2014   DOE (dyspnea on exertion) 05/24/2014   Primary osteoarthritis of left knee 04/26/2014   High risk medication use 04/17/2014   Aphasia 02/12/2014   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 ([place]) 01/18/2014   Lumbar facet arthropathy 01/18/2014   Bilateral edema of lower extremity 05/30/2013   Chronic cough 05/24/2013   ILD (interstitial lung disease) ([place]) 04/10/2013   Spinal stenosis of lumbar region with radiculopathy 07/12/2012   Inability to walk 07/12/2012   Meningioma of left sphenoid wing involving cavernous sinus ([place]) 02/17/2012   Chest pain of uncertain etiology 53/20/2334   Rheumatoid arthritis ([place]) 01/28/2012   Primary osteoarthritis of right knee 01/28/2012   Dyslipidemia    Thyroid disease    Essential hypertension    PCP:  [name], MD Pharmacy:   [facility] [place], [place] - [address] [place] [address] Phone: [phone] Fax: [phone]  [facility] [place], [place] - [address] [place] [address] Phone: [phone] Fax: [phone]     Social Determinants of Health (SDOH) Interventions    Readmission Risk Interventions Readmission Risk Prevention Plan 04/26/2019  Transportation Screening Complete  PCP or Specialist Appt within 3-5  Days Complete  HRI or Home Care Consult Complete  Social Work Consult for [place] Planning/Counseling Complete  Palliative Care Screening Not Applicable  Medication Review Press photographer) Complete  Some recent data might be hidden    Joan Reeve, LCSW Clinical Social Worker

## 2021-04-01 NOTE — Progress Notes (Signed)
PT Cancellation Note  Patient Details Name: [name] MRN: [id number] DOB: 07-21-1940   Cancelled Treatment:    Reason Eval/Treat Not Completed: Patient declined, no reason specified;Fatigue/lethargy limiting ability to participate. Patient reports she walked with mobility specialist and recently got back into bed. I will re-attempt later as time allows.     Kayleena Eke 04/01/2021, 1:24 PM

## 2021-04-01 NOTE — Progress Notes (Signed)
Mobility Specialist Progress Note   04/01/21 1145  Mobility  Activity Ambulated in room  Level of Assistance Minimal assist, patient does 75% or more  Assistive Device Front wheel walker  Distance Ambulated (ft) 12 ft  Mobility Ambulated with assistance in room  Mobility Response Tolerated well  Mobility performed by Mobility specialist  $Mobility charge 1 Mobility   Received pt in bed c/o slight back pain but agreeable to mobility. Upon sitting EOB pt desat 82% SpO2 w/ an unclear pleth. After ~57mns of pursed lip breathing, pt resat to 95% SpO2 w/ a clear pleth. x1 seated break d/t DOE and LE fatigue. Cut session short d/t pt being on lasix and unknowingly voiding x3. Left w/ pt in chair and NT in room in preparation for a bath.   JHolland FallingMobility Specialist Phone Number [phone]

## 2021-04-01 NOTE — Progress Notes (Signed)
PROGRESS NOTE  [name] MKL:[id number] DOB: 1940/06/11   PCP: [name], MD  Patient is from: Home.  Lives with daughter.  DOA: 03/30/2021 LOS: 1  Chief complaints:  Chief Complaint  Patient presents with   Altered Mental Status     Brief Narrative / Interim history: 80 year old F with PMH of ILD/chronic hypoxic RF on 2 L at night, DM-2 with neuropathy, chronic back pain/spinal stenosis with radiculopathy, diastolic CHF, CKD-2, HTN, hypothyroidism, RA on prednisone, anxiety and depression brought to ED by EMS due to increased confusion and poor p.o. intake for 1 day.  Reportedly had her morphine increased by pain management clinic recently, and has been sleepier and not eating well since then.  Also had abdominal pain, loose stools and decreased urine output for 3 to 4 days.   In ED, febrile to 100.5.  WBC 9.5.  Lactic acid normal.  Cr 2.2 (baseline 0.7-0.8).  K3.1.  Mg 1.6.  BNP and ammonia normal.  CXR consistent with underlying ILD and unchanged from baseline.  CT head without acute intracranial finding.  UA with large LE, small Hgb and rare bacteria.  VBG 7.37/66/98/38.  Repeat ABG 4 hours later 7.46/46/70/33.  Cultures obtained.  Received IV boluses.  Started on IVF and IV ceftriaxone for possible sepsis due to possible UTI, and admitted for observation.  The next day, AKI and encephalopathy resolved.  Slightly tachycardic at rest.  Blood cultures negative.  Urine culture with Klebsiella pneumonia.  Therapy recommended SNF.  Subjective: Seen and examined earlier this morning.  No major events overnight of this morning.  Noted some swelling in the arms.  Also some chest tightness and shortness of breath.  He diuretics have been on hold due to AKI.  Still with back pain which is worse with movement.  Daughter at bedside.  Objective: Vitals:   03/31/21 1500 03/31/21 1613 03/31/21 2030 04/01/21 1030  BP: (!) 153/96 (!) 152/85 140/86 (!) 150/95  Pulse: 97 (!) 101 (!) 108  96  Resp: _0 Temp: 99.3 F (37.4 C) 98.2 F (36.8 C) 99 F (37.2 C) 98.1 F (36.7 C)  TempSrc: Oral  Oral Oral  SpO2: 99% 99% 97% 100%  Weight:        Intake/Output Summary (Last 24 hours) at 04/01/2021 1215 Last data filed at 03/31/2021 2218 Gross per 24 hour  Intake 280 ml  Output 200 ml  Net 80 ml   Filed Weights   03/31/21 0445  Weight: 103.2 kg    Examination:  GENERAL: No apparent distress.  Nontoxic. HEENT: MMM.  Vision and hearing grossly intact.  NECK: Supple.  Difficult to assess JVD due to body habitus. RESP: 100% on 2 L.  No IWOB.  Fair aeration but difficult exam due to body habitus. CVS:  RRR. Heart sounds normal.  ABD/GI/GU: BS+. Abd soft, NTND.  MSK/EXT:  Moves extremities. No apparent deformity.  Trace BUE and BLE edema. SKIN: no apparent skin lesion or wound NEURO: Awake and alert. Oriented appropriately.  Chronic LLE weakness from arthritis and spinal stenosis. PSYCH: Calm. Normal affect.   Procedures:  None  Microbiology summarized: [id number] and influenza PCR nonreactive. Blood culture NGTD Urine culture pending.  Assessment & Plan: Acute toxic and metabolic encephalopathy-likely from polypharmacy in the setting of renal failure and possible severe sepsis due to UTI.  Patient is on MS Contin, MS IR, gabapentin and Flexeril.  She states hip pain doctor increased MS IR from twice daily to  3 times daily.  -Extensive discussion about her risk for polypharmacy. I suggested cutting down on her pain medication and reviewed the minimum to take the pain edge off. It is a very difficult situation for her.  Surgery of the table due to her lung.  Physical activity limited by pain.  Only option is watching her diet, physical therapy and pain meds. -Encouraged to discuss different pain meds that are not renal dependent for clearance with her pain specialist. -Holding MSIR -Reorientation and delirium precautions.  Severe sepsis due to Klebsiella  UTI-difficult diagnosis without urinary symptoms but she has fever and tachycardia with encephalopathy, AKI and thrombocytopenia on presentation.  UA with pyuria and rare bacteria.  Urine culture with Klebsiella pneumonia.  Blood cultures NGTD.  Sepsis physiology resolved.   -Continue IV ceftriaxone pending urine culture sensitivity   Respiratory acidosis: seems compensated.  Patient is on oxygen and opiates could contribute.  She says she tested negative for sleep apnea.  Resolved. -Would be cautious with pain medication -Wean oxygen as able -IS/OOB/PT/OT  AKI-likely prerenal insult from poor p.o. intake along with concurrent use of diuretics and ARB.  She denies NSAID use.  AKI resolved. Recent Labs    11/08/20 1122 01/16/21 1535 01/16/21 1557 01/17/21 0356 01/18/21 0324 01/19/21 0211 01/24/21 1156 03/30/21 1200 03/31/21 0639 04/01/21 0041  BUN 21 21 27* _0 7*  CREATININE 0.96 1.61* 1.60* 1.05* 0.78 0.79 0.78 2.22* 0.88 0.67  -Continue monitoring.   Acute on chronic diastolic CHF: TTE in 10/5679 with LVEF of 60 to 65%.  No mention of diastolic function.  Right systolic function normal.  Likely due to aggressive fluid resuscitation for severe sepsis on arrival.  Home diuretics were on hold.  She seems to have some swelling in her arms or legs but difficult to assess fluid status due to body habitus.  BNP within normal but unreliable due to body habitus.  Troponin negative. -IV Lasix 60 mg x 1.  Restart home Lasix 80 mg daily in the morning -Continue holding metolazone -Monitor fluid status, renal functions and electrolytes. -Start sodium and fluid restriction.  Essential hypertension/sinus tachycardia: Tachycardia improved.  BP slightly elevated.  -Resume home Toprol-XL at 100 mg daily.  Takes 125 mg daily at home -Avapro instead of home irbesartan at reduced dose -Diuretics as above  Controlled NIDDM-2 with hyperglycemia and neuropathy: A1c 5.6% in 09/2020. Recent  Labs  Lab 03/31/21 0816 03/31/21 1141 03/31/21 1655 03/31/21 2026 04/01/21 1034  GLUCAP 119* 150* 160* 198* 126*  -Check hemoglobin A1c -Continue current insulin regimen -Continue home gabapentin and a statin.  Chronic back pain/spinal stenosis with radiculopathy/lower extremity weakness-followed by pain clinic.  Not able to have surgery due to he will respiratory failure and ILD.  It seems she was on MSIR before, and MS quarantine was added recently.  No red flags. -Continue home MS Contin, gabapentin and Cymbalta -Resume home MSIR twice daily as needed. -Outpatient follow-up with a pain specialist -Continue bowel regimen for constipation -IS/OOB/PT/OT  History of rheumatoid arthritis -Continue home prednisone, Arava and Plaquenil.  Anxiety and depression -Continue home Cymbalta  At risk for polypharmacy -See discussion above.  Thrombocytopenia: Seems to be acute on chronic.  Likely from sepsis.  Improved. Recent Labs  Lab 03/30/21 1200 03/31/21 0639 04/01/21 0041  PLT 119* 98* 106*  -Recheck in the morning   Morbid obesity: Reportedly lost about 70 pounds.  Congratulated. Body mass index is 39.05 kg/m.  -Already on Ozempic  at home -Encouraged to continue working on losing weight. -Consult dietitian for teaching     Pressure skin injury: POA Pressure Injury 03/30/21 Ischial tuberosity Right;Left;Posterior Stage 1 -  Intact skin with non-blanchable redness of a localized area usually over a bony prominence. (Active)  03/30/21 1800  Location: Ischial tuberosity  Location Orientation: Right;Left;Posterior  Staging: Stage 1 -  Intact skin with non-blanchable redness of a localized area usually over a bony prominence.  Wound Description (Comments):   Present on Admission: Yes   DVT prophylaxis:  enoxaparin (LOVENOX) injection 40 mg Start: 03/31/21 1315  Code Status: Full code Family Communication: Updated patient's daughter at bedside. Level of care: Telemetry  Medical Status is: Inpatient  The patient remains to inpatient because: Severe sepsis due to Klebsiella UTI requiring IV antibiotics, acute on chronic diastolic CHF requiring IV Lasix and placement at SNF  Final disposition: Likely SNF.   Consultants:  None   Sch Meds:  Scheduled Meds:  allopurinol  300 mg Oral Daily   aspirin  325 mg Oral Daily   atorvastatin  10 mg Oral QPM   cholecalciferol  1,000 Units Oral Q lunch   diclofenac Sodium  2 g Topical TID   DULoxetine  60 mg Oral Daily   enoxaparin (LOVENOX) injection  40 mg Subcutaneous Q24H   fluticasone  2 spray Each Nare Daily   gabapentin  600 mg Oral TID   guaiFENesin  1,200 mg Oral BID   insulin aspart  0-9 Units Subcutaneous TID WC   leflunomide  20 mg Oral Daily   levothyroxine  50 mcg Oral Q0600   metoprolol tartrate  12.5 mg Oral BID   morphine  15 mg Oral Q12H   multivitamin with minerals  1 tablet Oral Daily   pantoprazole  40 mg Oral Daily   predniSONE  10 mg Oral q morning   senna  1 tablet Oral Daily   Continuous Infusions:  cefTRIAXone (ROCEPHIN)  IV Stopped (03/31/21 1418)   sodium phosphate  Dextrose 5% IVPB 15 mmol (04/01/21 0848)   PRN Meds:.acetaminophen **OR** acetaminophen, albuterol, fentaNYL (SUBLIMAZE) injection, ipratropium-albuterol, naphazoline-glycerin, ondansetron **OR** ondansetron (ZOFRAN) IV, polyethylene glycol  Antimicrobials: Anti-infectives (From admission, onward)    Start     Dose/Rate Route Frequency Ordered Stop   03/30/21 1345  cefTRIAXone (ROCEPHIN) 2 g in sodium chloride 0.9 % 100 mL IVPB        2 g 200 mL/hr over 30 Minutes Intravenous Every 24 hours 03/30/21 1338 04/06/21 1344        I have personally reviewed the following labs and images: CBC: Recent Labs  Lab 03/30/21 1200 03/30/21 1248 03/30/21 1638 03/31/21 0639 04/01/21 0041  WBC 9.5  --   --  7.3 7.4  NEUTROABS 5.4  --   --   --   --   HGB 12.3 12.9 11.9* 11.2* 11.1*  HCT 40.4 38.0 35.0* 36.5 36.3   MCV 102.3*  --   --  99.7 99.7  PLT 119*  --   --  98* 106*   BMP &GFR Recent Labs  Lab 03/30/21 1200 03/30/21 1248 03/30/21 1336 03/30/21 1638 03/31/21 0639 04/01/21 0041  NA 143 142  --  140 141 140  K 3.1* 3.3*  --  3.2* 3.6 4.8  CL 100  --   --   --  104 103  CO2 32  --   --   --  29 28  GLUCOSE 130*  --   --   --  119* 139*  BUN 17  --   --   --  9 7*  CREATININE 2.22*  --   --   --  0.88 0.67  CALCIUM 8.2*  --   --   --  7.8* 8.1*  MG  --   --  1.6*  --  1.8 1.9  PHOS  --   --   --   --   --  1.9*   Estimated Creatinine Clearance: 66.7 mL/min (by C-G formula based on SCr of 0.67 mg/dL). Liver & Pancreas: Recent Labs  Lab 03/30/21 1200 04/01/21 0041  AST 20  --   ALT 22  --   ALKPHOS 74  --   BILITOT 0.8  --   PROT 5.4*  --   ALBUMIN 3.0* 2.5*   No results for input(s): LIPASE, AMYLASE in the last 168 hours. Recent Labs  Lab 03/30/21 1152  AMMONIA <10   Diabetic: No results for input(s): HGBA1C in the last 72 hours. Recent Labs  Lab 03/31/21 0816 03/31/21 1141 03/31/21 1655 03/31/21 2026 04/01/21 1034  GLUCAP 119* 150* 160* 198* 126*   Cardiac Enzymes: Recent Labs  Lab 04/01/21 0041  CKTOTAL 47   No results for input(s): PROBNP in the last 8760 hours. Coagulation Profile: Recent Labs  Lab 03/30/21 1200  INR 1.0   Thyroid Function Tests: No results for input(s): TSH, T4TOTAL, FREET4, T3FREE, THYROIDAB in the last 72 hours. Lipid Profile: No results for input(s): CHOL, HDL, LDLCALC, TRIG, CHOLHDL, LDLDIRECT in the last 72 hours. Anemia Panel: No results for input(s): VITAMINB12, FOLATE, FERRITIN, TIBC, IRON, RETICCTPCT in the last 72 hours. Urine analysis:    Component Value Date/Time   COLORURINE AMBER (A) 03/30/2021 1200   APPEARANCEUR CLOUDY (A) 03/30/2021 1200   LABSPEC 1.018 03/30/2021 1200   PHURINE 5.0 03/30/2021 1200   GLUCOSEU NEGATIVE 03/30/2021 1200   HGBUR SMALL (A) 03/30/2021 1200   BILIRUBINUR NEGATIVE 03/30/2021 1200    BILIRUBINUR 1+ 01/30/2021 1450   KETONESUR NEGATIVE 03/30/2021 1200   PROTEINUR 30 (A) 03/30/2021 1200   UROBILINOGEN negative (A) 01/30/2021 1450   UROBILINOGEN 0.2 11/28/2019 1424   NITRITE NEGATIVE 03/30/2021 1200   LEUKOCYTESUR LARGE (A) 03/30/2021 1200   Sepsis Labs: Invalid input(s): PROCALCITONIN, [place]  Microbiology: Recent Results (from the past 240 hour(s))  Urine Culture     Status: Abnormal (Preliminary result)   Collection Time: 03/30/21 11:51 AM   Specimen: In/Out Cath Urine  Result Value Ref Range Status   Specimen Description IN/OUT CATH URINE  Final   Special Requests NONE  Final   Culture (A)  Final    80,000 COLONIES/mL KLEBSIELLA PNEUMONIAE SUSCEPTIBILITIES TO FOLLOW REPEATING Performed at [facility], [place] [address], [place], [place] [address]    Report Status PENDING  Incomplete  Resp Panel by RT-PCR (Flu A&B, Covid) Nasopharyngeal Swab     Status: None   Collection Time: 03/30/21 11:51 AM   Specimen: Nasopharyngeal Swab; Nasopharyngeal(NP) swabs in vial transport medium  Result Value Ref Range Status   SARS Coronavirus 2 by RT PCR NEGATIVE NEGATIVE Final    Comment: (NOTE) SARS-CoV-2 target nucleic acids are NOT DETECTED.  The SARS-CoV-2 RNA is generally detectable in upper respiratory specimens during the acute phase of infection. The lowest concentration of SARS-CoV-2 viral copies this assay can detect is 138 copies/mL. A negative result does not preclude SARS-Cov-2 infection and should not be used as the sole basis for treatment or other patient management decisions.  A negative result may occur with  improper specimen collection/handling, submission of specimen other than nasopharyngeal swab, presence of viral mutation(s) within the areas targeted by this assay, and inadequate number of viral copies(<138 copies/mL). A negative result must be combined with clinical observations, patient history, and epidemiological information. The  expected result is Negative.  Fact Sheet for Patients:  EntrepreneurPulse.com.au  Fact Sheet for Healthcare Providers:  IncredibleEmployment.be  This test is no t yet approved or cleared by the [place] FDA and  has been authorized for detection and/or diagnosis of SARS-CoV-2 by FDA under an Emergency Use Authorization (EUA). This EUA will remain  in effect (meaning this test can be used) for the duration of the COVID-19 declaration under Section 564(b)(1) of the Act, 21 U.S.C.section 360bbb-3(b)(1), unless the authorization is terminated  or revoked sooner.       Influenza A by PCR NEGATIVE NEGATIVE Final   Influenza B by PCR NEGATIVE NEGATIVE Final    Comment: (NOTE) The Xpert Xpress SARS-CoV-2/FLU/RSV plus assay is intended as an aid in the diagnosis of influenza from Nasopharyngeal swab specimens and should not be used as a sole basis for treatment. Nasal washings and aspirates are unacceptable for Xpert Xpress SARS-CoV-2/FLU/RSV testing.  Fact Sheet for Patients: EntrepreneurPulse.com.au  Fact Sheet for Healthcare Providers: IncredibleEmployment.be  This test is not yet approved or cleared by the [place] FDA and has been authorized for detection and/or diagnosis of SARS-CoV-2 by FDA under an Emergency Use Authorization (EUA). This EUA will remain in effect (meaning this test can be used) for the duration of the COVID-19 declaration under Section 564(b)(1) of the Act, 21 U.S.C. section 360bbb-3(b)(1), unless the authorization is terminated or revoked.  Performed at [facility], [place] [address], [place], [place] [address]   Blood Culture (routine x 2)     Status: None (Preliminary result)   Collection Time: 03/30/21 12:00 PM   Specimen: BLOOD  Result Value Ref Range Status   Specimen Description BLOOD LEFT ANTECUBITAL  Final   Special Requests   Final    BOTTLES DRAWN AEROBIC  AND ANAEROBIC Blood Culture adequate volume   Culture   Final    NO GROWTH 2 DAYS Performed at [facility], [place] [address] [place], [place] [address]    Report Status PENDING  Incomplete  Blood Culture (routine x 2)     Status: None (Preliminary result)   Collection Time: 03/30/21 12:00 PM   Specimen: BLOOD  Result Value Ref Range Status   Specimen Description BLOOD RIGHT ANTECUBITAL  Final   Special Requests   Final    BOTTLES DRAWN AEROBIC AND ANAEROBIC Blood Culture results may not be optimal due to an inadequate volume of blood received in culture bottles   Culture   Final    NO GROWTH 2 DAYS Performed at [facility], [place] [address], [place], [place] [address]    Report Status PENDING  Incomplete    Radiology Studies: No results found.   Yordy Matton T. [place]  If 7PM-7AM, please contact night-coverage [email or web address] 04/01/2021, 12:15 PM

## 2021-04-01 NOTE — [place] FL2 (Signed)
[place] MEDICAID FL2 LEVEL OF CARE SCREENING TOOL     IDENTIFICATION  Patient Name: [name] Birthdate: August 26, 1940 Sex: female Admission Date (Current Location): 03/30/2021  [facility] and [place] Number:  Herbalist and Address:  [facility], [place] [address] [place], [place] [address]      Provider Number: [id number]  Attending Physician Name and Address:  Mercy Riding, MD  Relative Name and Phone Number:       Current Level of Care: Hospital Recommended Level of Care: [place] Prior Approval Number:    Date Approved/Denied:   PASRR Number: [id number]  Discharge Plan: SNF    Current Diagnoses: Patient Active Problem List   Diagnosis Date Noted   Pressure injury of skin 03/31/2021   UTI (urinary tract infection) 03/31/2021   Sepsis due to urinary tract infection ([place]) 03/30/2021   Acute encephalopathy 03/30/2021   Thrombocytopenia ([place]) 03/30/2021   Diabetic neuropathy ([place]) 05/24/2019   Hypokalemia 04/21/2019   Anticoagulated 06/25/2018   Chronic pain 06/25/2018   CRI (chronic renal insufficiency), stage 3 (moderate) ([place]) 06/25/2018   Drug-induced constipation 06/18/2018   DVT (deep venous thrombosis) ([place]) 06/03/2018   Primary osteoarthritis of both knees 05/26/2018   Body mass index (BMI) of 50-59.9 in adult ([place]) 05/26/2018   Acquired hypothyroidism 05/26/2018   Chronic bilateral low back pain without sciatica 02/17/2018   Astigmatism with presbyopia, bilateral 03/26/2017   Cortical age-related cataract of both eyes 03/26/2017   Family history of glaucoma 03/26/2017   Long term current use of oral hypoglycemic drug 03/26/2017   Nuclear sclerotic cataract of both eyes 03/26/2017   Pain in left hip 02/18/2017   Closed nondisplaced intertrochanteric fracture of left femur with delayed healing 01/14/2017   Gout 11/29/2016   Depression 11/29/2016   Closed left hip fracture ([place]) 11/29/2016   GERD  (gastroesophageal reflux disease) 11/29/2016   Closed intertrochanteric fracture of hip, left, initial encounter ([place]) 11/29/2016   Physical deconditioning 07/31/2015   Pulmonary fibrosis, postinflammatory ([place]) 07/31/2015   Bronchiectasis without complication ([place]) 47/84/1282   Tendinitis of left rotator cuff 06/13/2015   Chronic diastolic CHF (congestive heart failure) ([place]) 04/21/2015   Acute kidney injury superimposed on chronic kidney disease ([place]) 04/17/2015   Chronic respiratory failure with hypoxia ([place]) 02/09/2015   Dyspnea and respiratory abnormality 01/03/2015   Normal coronary arteries 11/02/2014   Morbid obesity ([place]) 10/30/2014   Dysphagia 08/21/2014   Chronic fatigue 06/21/2014   DOE (dyspnea on exertion) 05/24/2014   Primary osteoarthritis of left knee 04/26/2014   High risk medication use 04/17/2014   Aphasia 02/12/2014   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 ([place]) 01/18/2014   Lumbar facet arthropathy 01/18/2014   Bilateral edema of lower extremity 05/30/2013   Chronic cough 05/24/2013   ILD (interstitial lung disease) ([place]) 04/10/2013   Spinal stenosis of lumbar region with radiculopathy 07/12/2012   Inability to walk 07/12/2012   Meningioma of left sphenoid wing involving cavernous sinus ([place]) 02/17/2012   Chest pain of uncertain etiology 12/29/8869   Rheumatoid arthritis ([place]) 01/28/2012   Primary osteoarthritis of right knee 01/28/2012   Dyslipidemia    Thyroid disease    Essential hypertension     Orientation RESPIRATION BLADDER Height & Weight     Self, Time, Place  Normal Continent Weight: 227 lb 8.2 oz (103.2 kg) Height:     BEHAVIORAL SYMPTOMS/MOOD NEUROLOGICAL BOWEL NUTRITION STATUS      Continent Diet (See DC  summary)  AMBULATORY STATUS COMMUNICATION OF NEEDS Skin   Limited Assist Verbally Normal                       Personal Care Assistance Level of Assistance  Bathing, Feeding, Dressing Bathing Assistance: Limited  assistance Feeding assistance: Independent Dressing Assistance: Limited assistance     Functional Limitations Info  Sight, Hearing, Speech Sight Info: Impaired Hearing Info: Adequate Speech Info: Adequate    SPECIAL CARE FACTORS FREQUENCY  PT (By licensed PT), OT (By licensed OT)     PT Frequency: 5x a week OT Frequency: 5x a week            Contractures Contractures Info: Not present    Additional Factors Info  Code Status, Allergies Code Status Info: DNR Allergies Info: Codeine   Infliximab   Lisinopril           Current Medications (04/01/2021):  This is the current hospital active medication list Current Facility-Administered Medications  Medication Dose Route Frequency Provider Last Rate Last Admin   acetaminophen (TYLENOL) tablet 650 mg  650 mg Oral Q6H PRN Orma Flaming, MD       Or   acetaminophen (TYLENOL) suppository 650 mg  650 mg Rectal Q6H PRN Orma Flaming, MD       albuterol (PROVENTIL) (2.5 MG/3ML) 0.083% nebulizer solution 3 mL  3 mL Inhalation Q6H PRN Orma Flaming, MD       allopurinol (ZYLOPRIM) tablet 300 mg  300 mg Oral Daily Wendee Beavers T, MD   300 mg at 04/01/21 1041   aspirin tablet 325 mg  325 mg Oral Daily Orma Flaming, MD   325 mg at 04/01/21 1041   atorvastatin (LIPITOR) tablet 10 mg  10 mg Oral QPM Orma Flaming, MD   10 mg at 03/31/21 1724   cefTRIAXone (ROCEPHIN) 2 g in sodium chloride 0.9 % 100 mL IVPB  2 g Intravenous Q24H Loni Beckwith, PA-C   Stopped at 03/31/21 1418   cholecalciferol (VITAMIN D3) tablet 1,000 Units  1,000 Units Oral Q lunch Orma Flaming, MD   1,000 Units at 04/01/21 1330   diclofenac Sodium (VOLTAREN) 1 % topical gel 2 g  2 g Topical TID Orma Flaming, MD   2 g at 04/01/21 1104   DULoxetine (CYMBALTA) DR capsule 60 mg  60 mg Oral Daily Gonfa, Taye T, MD   60 mg at 04/01/21 1042   enoxaparin (LOVENOX) injection 40 mg  40 mg Subcutaneous Q24H Gonfa, Taye T, MD   40 mg at 04/01/21 1330   fluticasone  (FLONASE) 50 MCG/ACT nasal spray 2 spray  2 spray Each Nare Daily Orma Flaming, MD   2 spray at 04/01/21 1149   [START ON 04/02/2021] furosemide (LASIX) tablet 80 mg  80 mg Oral Daily Gonfa, Taye T, MD       gabapentin (NEURONTIN) tablet 600 mg  600 mg Oral TID Wendee Beavers T, MD   600 mg at 04/01/21 1041   guaiFENesin (MUCINEX) 12 hr tablet 1,200 mg  1,200 mg Oral BID Orma Flaming, MD   1,200 mg at 04/01/21 1042   hydroxychloroquine (PLAQUENIL) tablet 200 mg  200 mg Oral Daily Wendee Beavers T, MD   200 mg at 04/01/21 1335   insulin aspart (novoLOG) injection 0-9 Units  0-9 Units Subcutaneous TID WC Orma Flaming, MD   1 Units at 04/01/21 1049   ipratropium-albuterol (DUONEB) 0.5-2.5 (3) MG/3ML nebulizer solution 3 mL  3 mL Nebulization  Q6H PRN Orma Flaming, MD       irbesartan (AVAPRO) tablet 150 mg  150 mg Oral Daily Wendee Beavers T, MD   150 mg at 04/01/21 1330   leflunomide (ARAVA) tablet 20 mg  20 mg Oral Daily Orma Flaming, MD   20 mg at 04/01/21 1041   levothyroxine (SYNTHROID) tablet 50 mcg  50 mcg Oral Q0600 Orma Flaming, MD   50 mcg at 04/01/21 0525   [START ON 04/02/2021] linaclotide (LINZESS) capsule 72 mcg  72 mcg Oral QAC breakfast Wendee Beavers T, MD       metoprolol succinate (TOPROL-XL) 24 hr tablet 100 mg  100 mg Oral Daily Wendee Beavers T, MD   100 mg at 04/01/21 1335   morphine (MS CONTIN) 12 hr tablet 15 mg  15 mg Oral Q12H Orma Flaming, MD   15 mg at 04/01/21 1046   morphine (MSIR) tablet 15 mg  15 mg Oral Q12H PRN Wendee Beavers T, MD   15 mg at 04/01/21 1335   multivitamin with minerals tablet 1 tablet  1 tablet Oral Daily Orma Flaming, MD   1 tablet at 04/01/21 1041   naphazoline-glycerin (CLEAR EYES REDNESS) ophth solution 1-2 drop  1-2 drop Left Eye QID PRN Orma Flaming, MD   2 drop at 03/31/21 1018   ondansetron (ZOFRAN) tablet 4 mg  4 mg Oral Q6H PRN Orma Flaming, MD       Or   ondansetron [facility]) injection 4 mg  4 mg Intravenous Q6H PRN Orma Flaming, MD        pantoprazole (PROTONIX) EC tablet 40 mg  40 mg Oral Daily Orma Flaming, MD   40 mg at 04/01/21 1041   polyethylene glycol (MIRALAX / GLYCOLAX) packet 17 g  17 g Oral BID PRN Wendee Beavers T, MD       predniSONE (DELTASONE) tablet 10 mg  10 mg Oral q morning Orma Flaming, MD   10 mg at 04/01/21 1001   senna (SENOKOT) tablet 8.6 mg  1 tablet Oral Daily Wendee Beavers T, MD   8.6 mg at 04/01/21 1040     Discharge Medications: Please see discharge summary for a list of discharge medications.  Relevant Imaging Results:  Relevant Lab Results:   Additional Information SSN: [id number], covid vax x2  Emeterio Reeve, LCSW

## 2021-04-01 NOTE — Progress Notes (Addendum)
Initial Nutrition Assessment  DOCUMENTATION CODES:   Obesity unspecified  INTERVENTION:   Continue Multivitamin w/ minerals daily  Ensure Enlive po Daily, each supplement provides 350 kcal and 20 grams of protein  Recommend adjusting pt to Carb Mod diet with 1500 mL fluid restriction. Received ok from MD. Encourage good PO intake  NUTRITION DIAGNOSIS:   Increased nutrient needs related to wound healing, chronic illness as evidenced by estimated needs.  GOAL:   Patient will meet greater than or equal to 90% of their needs  MONITOR:   PO intake, Supplement acceptance, Labs, I & O's  REASON FOR ASSESSMENT:   Consult Diet education  ASSESSMENT:   80 y.o. female presented to the ED with altered mental status. PMH includes T2DM, HTN, CKD III, chronic respiratory failure, CHF, and GERD. Pt admitted with sepsis 2/2 Klebsiella bacteria and acute toxic and encephalopathy.   Pt reports that her appetite has been poor and not eating a lot since her previous admission a few months ago. Pt reports that she lives with her husband and son, but they have someone that comes and does the cooking for them. Pt reports that her intake and appetite has never improved since the last admission. Pt reports a typical intake of: Breakfast: hard boiled egg or english muffin w/ fruit Lunch: 1/2 [place] sandwich with fruit  Dinner: spaghetti or fish w/ baked potato and cooked vegetables Pt states that she eats fairly healthy and that she use to eat yogurt and salads but doesn't anymore after told she couldn't due to colitis. Pt also reports that she has been drinking Premier Protein shakes at home, no more than 2 per day, but typically replaces her lunch with a shake.   Per EMR, pt intake includes 100% for breakfast and 100% for lunch on 11/13.   Pt reports that she was 300# about 2 years ago and slowly lost down to be her new UBW of 236#. Since her admission in August she has now lost to be 217#, this  would be a 8% weight loss in 4 months. Per EMR, pt has had a 4% weight loss in 4 months. Although, unable to determine if pt weight loss is actual dry weight loss versus fluid loss. Pt noted to be on 80 mg Lasix per day. Pt reports that she uses a walker at home.   Discussed ONS with pt, explained the differences between Ensure Enlive/Plus, Max, Boost, and Premier Protein. Pt reports that she understands the differences now. Pt agreeable to Ensure Enlive during admission.   Discussed with the patient that it is important the the patient is getting enough calories and protein to help heal sepsis and new pressure injuries.   Medications reviewed and include: Vitamin D3, Lasix, SSI 0-9 units TID, MVI, Protonix, Prednisone, Senokot, IV antibiotics, Sodium Phosphate Labs reviewed: Magnesium 1.9, Phosphorus 1.9  NUTRITION - FOCUSED PHYSICAL EXAM:  Flowsheet Row Most Recent Value  Orbital Region No depletion  Upper Arm Region No depletion  Thoracic and Lumbar Region No depletion  Buccal Region No depletion  Temple Region No depletion  Clavicle Bone Region No depletion  Clavicle and Acromion Bone Region No depletion  Scapular Bone Region No depletion  Dorsal Hand No depletion  Patellar Region Mild depletion  Anterior Thigh Region Mild depletion  Posterior Calf Region Mild depletion  Edema (RD Assessment) Mild  Hair Reviewed  Eyes Reviewed  Mouth Reviewed  Skin Reviewed  Nails Reviewed       Diet Order:  Diet Order             Diet heart healthy/carb modified Room service appropriate? Yes; Fluid consistency: Thin; Fluid restriction: 1500 mL Fluid  Diet effective now                   EDUCATION NEEDS:   No education needs have been identified at this time  Skin:  Skin Assessment: Skin Integrity Issues: Skin Integrity Issues:: Stage I Stage I: L, R, Posterior - Ischial Tuberosity  Last BM:  03/31/2021  Height:   Ht Readings from Last 1 Encounters:  03/27/21 5' 4"  (1.626 m)    Weight:   Wt Readings from Last 1 Encounters:  03/31/21 103.2 kg    Ideal Body Weight:  54.6 kg  BMI:  Body mass index is 39.05 kg/m.  Estimated Nutritional Needs:   Kcal:  1800-2000  Protein:  95-110 grams  Fluid:  > 1.5 L    Vong Garringer BS, PLDN Clinical Dietitian See AMiON for contact information.

## 2021-04-02 DIAGNOSIS — M05741 Rheumatoid arthritis with rheumatoid factor of right hand without organ or systems involvement: Secondary | ICD-10-CM

## 2021-04-02 DIAGNOSIS — N3000 Acute cystitis without hematuria: Secondary | ICD-10-CM

## 2021-04-02 DIAGNOSIS — M05742 Rheumatoid arthritis with rheumatoid factor of left hand without organ or systems involvement: Secondary | ICD-10-CM

## 2021-04-02 DIAGNOSIS — F32A Depression, unspecified: Secondary | ICD-10-CM

## 2021-04-02 DIAGNOSIS — E1165 Type 2 diabetes mellitus with hyperglycemia: Secondary | ICD-10-CM

## 2021-04-02 DIAGNOSIS — F419 Anxiety disorder, unspecified: Secondary | ICD-10-CM

## 2021-04-02 DIAGNOSIS — J9611 Chronic respiratory failure with hypoxia: Secondary | ICD-10-CM

## 2021-04-02 LAB — RENAL FUNCTION PANEL
Albumin: 2.6 g/dL — ABNORMAL LOW (ref 3.5–5.0)
Anion gap: 8 (ref 5–15)
BUN: 7 mg/dL — ABNORMAL LOW (ref 8–23)
CO2: 32 mmol/L (ref 22–32)
Calcium: 8.2 mg/dL — ABNORMAL LOW (ref 8.9–10.3)
Chloride: 99 mmol/L (ref 98–111)
Creatinine, Ser: 0.79 mg/dL (ref 0.44–1.00)
GFR, Estimated: >60 mL/min (ref >60)
Glucose, Bld: 157 mg/dL — ABNORMAL HIGH (ref 70–99)
Phosphorus: 2.9 mg/dL (ref 2.5–4.6)
Potassium: 4.1 mmol/L (ref 3.5–5.1)
Sodium: 139 mmol/L (ref 135–145)

## 2021-04-02 LAB — CBC
HCT: 36.4 % (ref 36.0–46.0)
Hemoglobin: 11 g/dL — ABNORMAL LOW (ref 12.0–15.0)
MCH: 29.7 pg (ref 26.0–34.0)
MCHC: 30.2 g/dL (ref 30.0–36.0)
MCV: 98.4 fL (ref 80.0–100.0)
Platelets: 126 10*3/uL — ABNORMAL LOW (ref 150–400)
RBC: 3.7 MIL/uL — ABNORMAL LOW (ref 3.87–5.11)
RDW: 14.4 % (ref 11.5–15.5)
WBC: 5.4 10*3/uL (ref 4.0–10.5)
nRBC: 0 % (ref 0.0–0.2)

## 2021-04-02 LAB — MRSA NEXT GEN BY PCR, NASAL: MRSA by PCR Next Gen: NOT DETECTED

## 2021-04-02 LAB — URINE CULTURE: Culture: 80000 — AB

## 2021-04-02 LAB — GLUCOSE, CAPILLARY
Glucose-Capillary: 157 mg/dL — ABNORMAL HIGH (ref 70–99)
Glucose-Capillary: 156 mg/dL — ABNORMAL HIGH (ref 70–99)
Glucose-Capillary: 231 mg/dL — ABNORMAL HIGH (ref 70–99)
Glucose-Capillary: 146 mg/dL — ABNORMAL HIGH (ref 70–99)

## 2021-04-02 LAB — RESP PANEL BY RT-PCR (FLU A&B, COVID) ARPGX2
Influenza A by PCR: NEGATIVE
Influenza B by PCR: NEGATIVE
SARS Coronavirus 2 by RT PCR: NEGATIVE

## 2021-04-02 LAB — MAGNESIUM: Magnesium: 1.9 mg/dL (ref 1.7–2.4)

## 2021-04-02 LAB — HEMOGLOBIN A1C
Hgb A1c MFr Bld: 6.5 % — ABNORMAL HIGH (ref 4.8–5.6)
Mean Plasma Glucose: 139.85 mg/dL

## 2021-04-02 MED ORDER — MORPHINE SULFATE 15 MG PO TABS: ORAL_TABLET | ORAL | 0 refills | Status: DC

## 2021-04-02 MED ORDER — CEPHALEXIN 500 MG PO CAPS: 500.0000 mg | ORAL_CAPSULE | Freq: Three times a day (TID) | ORAL | 0 refills | Status: AC

## 2021-04-02 MED ORDER — MORPHINE SULFATE ER 15 MG PO TBCR
15.0000 mg | EXTENDED_RELEASE_TABLET | Freq: Two times a day (BID) | ORAL | 0 refills | Status: DC
Start: 2021-04-02 — End: 2021-04-17

## 2021-04-02 MED ORDER — CEPHALEXIN 500 MG PO CAPS
500.0000 mg | ORAL_CAPSULE | Freq: Three times a day (TID) | ORAL | Status: DC
  Administered 2021-04-02 – 2021-04-03 (×4): 500 mg via ORAL
  Filled 2021-04-02 (×4): qty 1

## 2021-04-02 NOTE — Progress Notes (Signed)
Physical Therapy Treatment Patient Details Name: [name] MRN: [id number] DOB: 1941/04/14 Today's Date: 04/02/2021   History of Present Illness 80 yo female has AKI with UTI(+) sepsis admitted on 03/30/21. PMH COPD CHF anemia arthritis anxiety DMII obese    PT Comments    Pt making gradual progress today.  She continues to require min A at times and cues with frequent rest break.  Continue to recommend SNF as pt well below baseline, not ambulating household distances, and is fall risk with limited physical assist/supervision at home.     Recommendations for follow up therapy are one component of a multi-disciplinary discharge planning process, led by the attending physician.  Recommendations may be updated based on patient status, additional functional criteria and insurance authorization.  Follow Up Recommendations  Skilled nursing-short term rehab (<3 hours/day)     Assistance Recommended at Discharge Frequent or constant Supervision/Assistance  Equipment Recommendations  None recommended by PT    Recommendations for Other Services       Precautions / Restrictions Precautions Precautions: Fall     Mobility  Bed Mobility               General bed mobility comments: EOB w dtr at arrival    Transfers Overall transfer level: Needs assistance Equipment used: Rolling walker (2 wheels) Transfers: Sit to/from Stand Sit to Stand: Min guard           General transfer comment: Performed x 3 with min guard and min cues    Ambulation/Gait Ambulation/Gait assistance: Min guard Gait Distance (Feet): 35 Feet (10', 30', 35') Assistive device: Rolling walker (2 wheels) Gait Pattern/deviations: Step-through pattern;Decreased stride length;Antalgic;Decreased stance time - right Gait velocity: decreased     General Gait Details: Pt with flexed posture and increased lateral sway with gait.  Partially able to correct posture and get closer to walker with  cues.   Stairs             Wheelchair Mobility    Modified Rankin (Stroke Patients Only)       Balance Overall balance assessment: Needs assistance Sitting-balance support: No upper extremity supported Sitting balance-Leahy Scale: Good     Standing balance support: Bilateral upper extremity supported;No upper extremity supported Standing balance-Leahy Scale: Fair Standing balance comment: RW to ambulate but able to pull up Depends in standing.  Min A to don underwear in sitting and pt donning sockswith supervision                            Cognition Arousal/Alertness: Awake/alert Behavior During Therapy: WFL for tasks assessed/performed Overall Cognitive Status: Within Functional Limits for tasks assessed                                          Exercises      General Comments General comments (skin integrity, edema, etc.): Pt on RA with sats 93-96%.  Pt reports somewhat limited due to back pain at baseline.  Reports back pain on/off for a few years.  Has seen pain specialist.  Right now back pain increased with mobility but was ok at EOB.  She reports normally radiates into L leg but recently has been R leg too.  States usually worse with bending forward but she was able to don socks today and reports that has been a new development in  past 2 months (that she could lean forward to don socks).  No change with rotation.  Pain did increase in spine with lumbar extension but did not have radiating pain at this time so unable to assess effect on radiating pain.  Pt had pain meds prior.  Advised to continue mobiltiy as able with rest breaks and continue therapy.      Pertinent Vitals/Pain Pain Assessment: Faces Faces Pain Scale: Hurts little more Pain Location: back and R hip walking Pain Descriptors / Indicators: Sore Pain Intervention(s): Limited activity within patient's tolerance;Monitored during session;Repositioned;Premedicated before  session    Home Living                          Prior Function            PT Goals (current goals can now be found in the care plan section) Progress towards PT goals: Progressing toward goals    Frequency    Min 2X/week      PT Plan Current plan remains appropriate    Co-evaluation              AM-PAC PT "6 Clicks" Mobility   Outcome Measure  Help needed turning from your back to your side while in a flat bed without using bedrails?: A Little Help needed moving from lying on your back to sitting on the side of a flat bed without using bedrails?: A Little Help needed moving to and from a bed to a chair (including a wheelchair)?: A Little Help needed standing up from a chair using your arms (e.g., wheelchair or bedside chair)?: A Little Help needed to walk in hospital room?: A Little Help needed climbing 3-5 steps with a railing? : Total 6 Click Score: 16    End of Session Equipment Utilized During Treatment: Gait belt Activity Tolerance: Patient tolerated treatment well Patient left: in chair;with call bell/phone within reach;with family/visitor present Nurse Communication: Mobility status PT Visit Diagnosis: Unsteadiness on feet (R26.81);Muscle weakness (generalized) (M62.81);Difficulty in walking, not elsewhere classified (R26.2)     Time: 3536-1443 PT Time Calculation (min) (ACUTE ONLY): 27 min  Charges:  $Gait Training: 8-22 mins $Therapeutic Activity: 8-22 mins                     Abran Richard, PT Acute Rehab Services Pager [phone] [facility] 04/02/2021, 12:33 PM

## 2021-04-02 NOTE — TOC Progression Note (Addendum)
Transition of Care [facility]) - Progression Note    Patient Details  Name: DAYZEE TROWER MRN: [id number] Date of Birth: [birth year]  [facility] CM/SW Contact  Emeterio Reeve, [place] Phone Number: 04/02/2021, 12:01 PM  Clinical Narrative:     CSW spoke to pt and daughter at bedside. They chose [facility] for SNF. CSW called [facility] to confirm they can take pt today. CSW is waiting for a call back.   2:30pm- [facility] can accept pt tomorrow. MD is aware.   Expected Discharge Plan: [place] Barriers to Discharge: Continued Medical Work up  Expected Discharge Plan and Services Expected Discharge Plan: [place] arrangements for the past 2 months: [place] Expected Discharge Date: 04/02/21                                     Social Determinants of Health (SDOH) Interventions    Readmission Risk Interventions Readmission Risk Prevention Plan 04/26/2019  Transportation Screening Complete  PCP or Specialist Appt within 3-5 Days Complete  HRI or [place] Complete  Social Work Consult for [facility] Planning/Counseling Complete  Palliative Care Screening Not Applicable  Medication Review [employer]) Complete  Some recent data might be hidden   Emeterio Reeve, LCSW Clinical Social Worker

## 2021-04-02 NOTE — Discharge Summary (Signed)
Physician Discharge Summary  [name] HDQ:[id number] DOB: [birth year] DOA: 03/30/2021  PCP: [name], MD  Admit date: 03/30/2021 Discharge date: 04/02/2021 Admitted From: Home Disposition: SNF Recommendations for Outpatient Follow-up:  Follow ups as below. Please obtain CBC/BMP/Mag at follow up Outpatient follow-up with pain clinic in 1 to 2 weeks or sooner if needed Please follow up on the following pending results: None  Discharge Condition: Stable CODE STATUS: DNR/DNI  Hospital Course: 80 year old F with PMH of ILD/chronic hypoxic RF on 2 L at night, DM-2 with neuropathy, chronic back pain/spinal stenosis with radiculopathy, diastolic CHF, CKD-2, HTN, hypothyroidism, RA on prednisone, anxiety and depression brought to ED by EMS due to increased confusion and poor p.o. intake for 1 day.  Reportedly had her morphine increased by pain management clinic recently, and has been sleepier and not eating well since then.  Also had abdominal pain, loose stools and decreased urine output for 3 to 4 days.    In ED, febrile to 100.5.  WBC 9.5.  Lactic acid normal.  Cr 2.2 (baseline 0.7-0.8).  K3.1.  Mg 1.6.  BNP and ammonia normal.  CXR consistent with underlying ILD and unchanged from baseline.  CT head without acute intracranial finding.  UA with large LE, small Hgb and rare bacteria.  VBG 7.37/66/98/38.  Repeat ABG 4 hours later 7.46/46/70/33.  Cultures obtained.  Received IV boluses.  Started on IVF and IV ceftriaxone for possible sepsis due to possible UTI, and admitted.    The next day, AKI and encephalopathy resolved.  Slightly tachycardic at rest.  Blood cultures negative.  Urine culture with Klebsiella pneumonia.  Patient was continued on IV ceftriaxone for 3 days and transition to p.o. Keflex for 2 more days to complete treatment course for Klebsiella pneumonia UTI.  Patient had some chest tightness, dyspnea and edema from IV fluid resuscitation and holding diuretics in the  setting of AKI.  Received IV Lasix 60 mg x 1 with resolution of his symptoms, now restarted on home p.o. Lasix.  Patient is discharged to SNF as recommended by therapy.  See individual problem list below for more on hospital course.  Discharge Diagnoses:  Acute toxic and metabolic encephalopathy-likely from polypharmacy in the setting of renal failure and possible severe sepsis due to UTI.  Patient is on MS Contin, MS IR, gabapentin and Flexeril.  She states her pain doctor increased MS IR from twice daily to 3 times daily for breakthrough pain.  Encephalopathy resolved. -Extensive discussion about her risk for polypharmacy. I suggested cutting down on her pain medication and reviewed the minimum to take the pain edge off. It is a very difficult situation for her.  She states surgery is off the table due to her lung.  Physical activity limited by pain.  Only option is watching her diet, physical therapy and pain meds. -Encouraged to discuss different pain meds that are not renal dependent for clearance with her pain specialist. -For now, decreased MS IR to twice daily for breakthrough pain -Continue other pain medications -Reorientation and delirium precautions.   Severe sepsis due to Klebsiella UTI-difficult diagnosis without urinary symptoms but she has fever and tachycardia with encephalopathy, AKI and thrombocytopenia on presentation.  UA with pyuria and rare bacteria.  Urine culture with Klebsiella pneumonia.  Blood cultures NGTD.  Sepsis physiology resolved.   -Received IV ceftriaxone for 3 days.  P.o. Keflex for 2 more days to complete treatment course.   Interstitial lung disease/chronic hypoxic respiratory failure/respiratory acidosis: seems  compensated.  Patient is on oxygen 2 L by Marissa and opiates which could contribute to respiratory acidosis.  She says she tested negative for sleep apnea.  Resolved. -Recommended minimum oxygen to keep saturation above 88% -Continue  DuoNeb -IS/OOB/PT/OT   AKI-likely prerenal insult from poor p.o. intake along with concurrent use of diuretics and ARB.  She denies NSAID use but she is on full dose aspirin.  AKI resolved. Recent Labs    01/16/21 1535 01/16/21 1557 01/17/21 0356 01/18/21 0324 01/19/21 0211 01/24/21 1156 03/30/21 1200 03/31/21 0639 04/01/21 0041 04/02/21 0056  BUN 21 27* _0 7* 7*  CREATININE 1.61* 1.60* 1.05* 0.78 0.79 0.78 2.22* 0.88 0.67 0.79  -Recheck renal function at follow-up     Acute on chronic diastolic CHF: TTE in 0/2334 with LVEF of 60 to 65%.  No mention of diastolic function.  Right systolic function normal.  Likely due to aggressive fluid resuscitation for severe sepsis on arrival.  Home diuretics were on hold.  She seems to have some swelling in her arms or legs but difficult to assess fluid status due to body habitus.  BNP within normal but unreliable due to body habitus.  Troponin negative.  Excellent urine output with IV Lasix.  Symptoms resolved.  Appears euvolemic -Back on home p.o. Lasix 80 mg daily -Continue holding metolazone.  Takes as needed at home. -Monitor fluid status, renal functions and electrolytes. -Start sodium and fluid restriction.   Essential hypertension/sinus tachycardia: Improved. -Continue home Toprol-XL, irbesartan and diuretics   Controlled NIDDM-2 with hyperglycemia and neuropathy: A1c 6.5%. Recent Labs  Lab 04/01/21 1034 04/01/21 1229 04/01/21 1733 04/01/21 2112 04/02/21 0830  GLUCAP 126* 112* 184* 187* 157*  -Continue home Ozempic, statin and gabapentin.   Chronic back pain/spinal stenosis with radiculopathy/lower extremity weakness-followed by pain clinic.  Not able to have surgery due to he will respiratory failure and ILD.  It seems she was on MSIR before, and MS quarantine was added recently.  No red flags. -Continue home MS Contin, gabapentin and Cymbalta -Resume home MSIR twice daily as needed. -Outpatient follow-up with a  pain specialist -Continue bowel regimen for constipation -IS/OOB/PT/OT   History of rheumatoid arthritis -Continue home prednisone, Arava and Plaquenil.   Anxiety and depression -Continue home Cymbalta   At risk for polypharmacy -See discussion above.   Thrombocytopenia: Seems to be acute on chronic.  Likely from sepsis.  Improved. Recent Labs  Lab 03/30/21 1200 03/31/21 0639 04/01/21 0041 04/02/21 0056  PLT 119* 98* 106* 126*  -Recheck CBC at follow-up.     Morbid obesity: Reportedly lost about 70 pounds.  Congratulated. -Continue Ozempic -Could benefit from help with dietitian at [facility]. Body mass index is 37.84 kg/m. Nutrition Problem: Increased nutrient needs Etiology: wound healing, chronic illness Signs/Symptoms: estimated needs Interventions: MVI, Ensure Enlive (each supplement provides 350kcal and 20 grams of protein)  Pressure Injury 03/30/21 Ischial tuberosity Right;Left;Posterior Stage 1 -  Intact skin with non-blanchable redness of a localized area usually over a bony prominence. (Active)  03/30/21 1800  Location: Ischial tuberosity  Location Orientation: Right;Left;Posterior  Staging: Stage 1 -  Intact skin with non-blanchable redness of a localized area usually over a bony prominence.  Wound Description (Comments):   Present on Admission: Yes    Discharge Exam: Vitals:   04/01/21 1730 04/01/21 2114 04/02/21 0433 04/02/21 0828  BP: 130/64 135/80 124/78 (!) 151/82  Pulse: 94 96 89 90  Temp: 98.5 F (36.9 C) 98.1 F (  36.7 C) 98.2 F (36.8 C) 98.1 F (36.7 C)  Resp: _0 Weight:   100 kg   SpO2: 97% 94% 94% 97%  TempSrc: Oral  Oral Oral  BMI (Calculated):   37.82      GENERAL: No apparent distress.  Nontoxic. HEENT: MMM.  Vision and hearing grossly intact.  NECK: Supple.  No apparent JVD.  RESP: 95% on RA.  No IWOB.  Fair aeration bilaterally. CVS:  RRR. Heart sounds normal.  ABD/GI/GU: Bowel sounds present. Soft. Non tender.  MSK/EXT:   Moves extremities. No apparent deformity. No edema.  SKIN: no apparent skin lesion or wound NEURO: Awake and alert.  Oriented appropriately.  No apparent focal neuro deficit other than chronic LLE weakness PSYCH: Calm. Normal affect.   Discharge Instructions   Allergies as of 04/02/2021       Reactions   Codeine Swelling, Other (See Comments)   Facial swelling Chest pain and swelling in legs Facial swelling   Infliximab Anaphylaxis   "sent me into shock" "sent me into shock"   Lisinopril Swelling   Face and neck swelling Face and neck swelling        Medication List     TAKE these medications    acetaminophen 500 MG tablet Commonly known as: TYLENOL Take 1,000 mg by mouth every 6 (six) hours as needed for moderate pain.   AeroChamber MV inhaler Use as instructed   albuterol 108 (90 Base) MCG/ACT inhaler Commonly known as: VENTOLIN HFA Inhale 2 puffs into the lungs every 6 (six) hours as needed for wheezing or shortness of breath.   allopurinol 300 MG tablet Commonly known as: ZYLOPRIM Take 300 mg by mouth daily.   atorvastatin 20 MG tablet Commonly known as: LIPITOR Take 0.5 tablets (10 mg total) by mouth every evening.   Blood Pressure Monitor/Arm Devi To check blood pressure up to 3 times daily.   Bufferin 325 MG Tabs tablet Generic drug: aspirin buffered Take 325 mg by mouth daily.   CALCIUM + D3 PO Take 2 tablets by mouth daily with lunch.   cephALEXin 500 MG capsule Commonly known as: KEFLEX Take 1 capsule (500 mg total) by mouth every 8 (eight) hours for 2 days.   cholecalciferol 1000 units tablet Commonly known as: VITAMIN D Take 1,000 Units by mouth daily with lunch.   colchicine 0.6 MG tablet Take 0.6 mg by mouth daily as needed (gout flares).   cyclobenzaprine 5 MG tablet Commonly known as: FLEXERIL Take 1 tablet (5 mg total) by mouth at bedtime as needed for muscle spasms.   diclofenac sodium 1 % Gel Commonly known as:  VOLTAREN Apply 2 g topically 3 (three) times daily. Both hands and knees   DRY EYES OP Apply 1 drop to eye daily as needed (dry eyes).   DULoxetine 60 MG capsule Commonly known as: CYMBALTA Take 1 capsule (60 mg total) by mouth daily.   fluticasone 50 MCG/ACT nasal spray Commonly known as: FLONASE Place 2 sprays into both nostrils daily.   furosemide 80 MG tablet Commonly known as: LASIX Take 1 tablet (80 mg total) by mouth daily.   gabapentin 600 MG tablet Commonly known as: NEURONTIN Take two in the morning , Two mid- afternoon and two at bedtime What changed:  how much to take how to take this when to take this additional instructions   guaiFENesin 600 MG 12 hr tablet Commonly known as: MUCINEX Take 1,200 mg by mouth 2 (two) times  daily.   hydroxychloroquine 200 MG tablet Commonly known as: PLAQUENIL Take 200 mg by mouth daily.   ipratropium-albuterol 0.5-2.5 (3) MG/3ML Soln Commonly known as: DUONEB Take 3 mLs by nebulization every 6 (six) hours as needed (cough/wheezing).   leflunomide 20 MG tablet Commonly known as: ARAVA Take 1 tablet (20 mg total) by mouth daily.   levothyroxine 50 MCG tablet Commonly known as: SYNTHROID Take 1 tablet (50 mcg total) by mouth daily.   linaclotide 72 MCG capsule Commonly known as: Linzess Take 1 capsule (72 mcg total) by mouth daily before breakfast.   metolazone 2.5 MG tablet Commonly known as: ZAROXOLYN TAKE 1 TABLET BY MOUTH DAILY AS NEEDED FOR 2 DAYS FOR WEIGHT ABOVE 255 POUNDS What changed: See the new instructions.   metoprolol succinate 25 MG 24 hr tablet Commonly known as: TOPROL-XL Take 5 tablets (125 mg total) by mouth daily.   morphine 15 MG tablet Commonly known as: MSIR One tablet twice a day as needed for break through pain. What changed: Another medication with the same name was changed. Make sure you understand how and when to take each.   morphine 15 MG 12 hr tablet Commonly known as: MS  CONTIN Take 1 tablet (15 mg total) by mouth every 12 (twelve) hours. What changed: additional instructions   multivitamin with minerals Tabs tablet Take 1 tablet by mouth daily.   olmesartan 40 MG tablet Commonly known as: BENICAR Take 1 tablet (40 mg total) by mouth daily.   OXYGEN Inhale 2 L into the lungs at bedtime. And on exertion   Ozempic (0.25 or 0.5 MG/DOSE) 2 MG/1.5ML Sopn Generic drug: Semaglutide(0.25 or 0.5MG/DOS) Inject 0.25 mg as directed once a week.   pantoprazole 40 MG tablet Commonly known as: PROTONIX take 1 tablet by mouth once daily MINUTES BEFORE 1ST MEAL OF THE DAY What changed:  how much to take how to take this when to take this additional instructions   Potassium Chloride ER 20 MEQ Tbcr Take 20 mEq by mouth daily.   predniSONE 5 MG tablet Commonly known as: DELTASONE Take 10 mg by mouth every morning.   sodium chloride 0.65 % Soln nasal spray Commonly known as: OCEAN Place 1 spray into both nostrils as needed for congestion.        Consultations: None  Procedures/Studies: None   CT ABDOMEN PELVIS WO CONTRAST  Result Date: 03/31/2021 CLINICAL DATA:  Abdominal pain, sepsis EXAM: CT ABDOMEN AND PELVIS WITHOUT CONTRAST TECHNIQUE: Multidetector CT imaging of the abdomen and pelvis was performed following the standard protocol without IV contrast. COMPARISON:  CTA abdomen/pelvis dated 01/16/2021 FINDINGS: Lower chest: Emphysematous changes at the lung bases. Hepatobiliary: Unenhanced liver is grossly unremarkable. Layering gallbladder sludge (series 3/image 27), without associated inflammatory changes. No intrahepatic or extrahepatic duct dilatation. Pancreas: Within normal limits. Spleen: Within normal limits. Adrenals/Urinary Tract: Adrenal glands are within normal limits. Bilateral renal cysts, including a 12 mm hemorrhagic cyst in the medial left upper kidney (series 3/image 31) and simple cysts measuring up to 5.3 cm in the lateral right  lower kidney (series 3/image 46). Three nonobstructing right lower pole renal calculi measuring up to 2 mm. No hydronephrosis. Bladder is notable for trace nondependent gas but is otherwise within normal limits. Stomach/Bowel: Stomach is within normal limits. No evidence of bowel obstruction. Appendix is not discretely visualized. No colonic wall thickening or inflammatory changes. Vascular/Lymphatic: No evidence of abdominal aortic aneurysm. Atherosclerotic calcifications of the abdominal aorta and branch vessels. To lymph nodes  Reproductive: Status post hysterectomy. No adnexal masses. Other: No abdominopelvic ascites. Musculoskeletal: Degenerative changes of the visualized thoracolumbar spine. Status post ORIF of the left hip. IMPRESSION: No CT findings to account for the patient's abdominal pain. Gallbladder sludge, without associated inflammatory changes. Three nonobstructing right lower pole renal calculi measure up to 2 mm. No hydronephrosis. Additional ancillary findings as above. Electronically Signed   By: Julian Hy M.D.   On: 03/31/2021 01:01   CT HEAD WO CONTRAST (5MM)  Result Date: 03/30/2021 CLINICAL DATA:  Mental status change EXAM: CT HEAD WITHOUT CONTRAST TECHNIQUE: Contiguous axial images were obtained from the base of the skull through the vertex without intravenous contrast. COMPARISON:  Head CT dated January 16, 2021 FINDINGS: Brain: Chronic white matter ischemic change. No evidence of acute infarction, hemorrhage, hydrocephalus, extra-axial collection or mass lesion/mass effect. Vascular: No hyperdense vessel or unexpected calcification. Skull: Normal. Negative for fracture or focal lesion. Sinuses/Orbits: No acute finding. Other: None. IMPRESSION: No acute intracranial abnormality. Electronically Signed   By: Yetta Glassman M.D.   On: 03/30/2021 12:36   DG Chest Port 1 View  Result Date: 03/30/2021 CLINICAL DATA:  80 year old female with possible sepsis. EXAM: PORTABLE CHEST  1 VIEW COMPARISON:  Chest x-ray 01/16/2021. FINDINGS: Lung volumes are low. Widespread areas of interstitial prominence and diffuse peribronchial cuffing noted in the lungs bilaterally, most severe there out the mid to lower lungs. No definite confluent consolidative airspace disease. No pleural effusions. No pneumothorax. Crowding of the pulmonary vasculature. No evidence of pulmonary edema. Heart size is normal. Mediastinal contours are distorted by patient positioning. IMPRESSION: 1. The appearance the chest is very similar to the prior chest radiograph, suggestive of interstitial lung disease. Outpatient referral to Pulmonology for further clinical evaluation is recommended. Additionally, follow-up nonemergent high-resolution chest CT is recommended in the near future to better evaluate these findings. Electronically Signed   By: Vinnie Langton M.D.   On: 03/30/2021 12:27       The results of significant diagnostics from this hospitalization (including imaging, microbiology, ancillary and laboratory) are listed below for reference.     Microbiology: Recent Results (from the past 240 hour(s))  Urine Culture     Status: Abnormal   Collection Time: 03/30/21 11:51 AM   Specimen: In/Out Cath Urine  Result Value Ref Range Status   Specimen Description IN/OUT CATH URINE  Final   Special Requests NONE  Final   Culture (A)  Final    80,000 COLONIES/mL KLEBSIELLA PNEUMONIAE Two isolates with different morphologies were identified as the same organism.The most resistant organism was reported. Performed at [facility], [place] [address] [place], [place] [address]    Report Status 04/02/2021 FINAL  Final   Organism ID, Bacteria KLEBSIELLA PNEUMONIAE (A)  Final      Susceptibility   Klebsiella pneumoniae - MIC*    AMPICILLIN RESISTANT Resistant     CEFAZOLIN <=4 SENSITIVE Sensitive     CEFEPIME <=0.12 SENSITIVE Sensitive     CEFTRIAXONE <=0.25 SENSITIVE Sensitive     CIPROFLOXACIN <=0.25  SENSITIVE Sensitive     GENTAMICIN <=1 SENSITIVE Sensitive     IMIPENEM <=0.25 SENSITIVE Sensitive     NITROFURANTOIN 128 RESISTANT Resistant     TRIMETH/SULFA >=320 RESISTANT Resistant     AMPICILLIN/SULBACTAM 4 SENSITIVE Sensitive     PIP/TAZO <=4 SENSITIVE Sensitive     * 80,000 COLONIES/mL KLEBSIELLA PNEUMONIAE  Resp Panel by RT-PCR (Flu A&B, Covid) Nasopharyngeal Swab     Status: None  Collection Time: 03/30/21 11:51 AM   Specimen: Nasopharyngeal Swab; Nasopharyngeal(NP) swabs in vial transport medium  Result Value Ref Range Status   SARS Coronavirus 2 by RT PCR NEGATIVE NEGATIVE Final    Comment: (NOTE) SARS-CoV-2 target nucleic acids are NOT DETECTED.  The SARS-CoV-2 RNA is generally detectable in upper respiratory specimens during the acute phase of infection. The lowest concentration of SARS-CoV-2 viral copies this assay can detect is 138 copies/mL. A negative result does not preclude SARS-Cov-2 infection and should not be used as the sole basis for treatment or other patient management decisions. A negative result may occur with  improper specimen collection/handling, submission of specimen other than nasopharyngeal swab, presence of viral mutation(s) within the areas targeted by this assay, and inadequate number of viral copies(<138 copies/mL). A negative result must be combined with clinical observations, patient history, and epidemiological information. The expected result is Negative.  Fact Sheet for Patients:  EntrepreneurPulse.com.au  Fact Sheet for Healthcare Providers:  IncredibleEmployment.be  This test is no t yet approved or cleared by the [place] FDA and  has been authorized for detection and/or diagnosis of SARS-CoV-2 by FDA under an Emergency Use Authorization (EUA). This EUA will remain  in effect (meaning this test can be used) for the duration of the COVID-19 declaration under Section 564(b)(1) of the Act,  21 U.S.C.section 360bbb-3(b)(1), unless the authorization is terminated  or revoked sooner.       Influenza A by PCR NEGATIVE NEGATIVE Final   Influenza B by PCR NEGATIVE NEGATIVE Final    Comment: (NOTE) The Xpert Xpress SARS-CoV-2/FLU/RSV plus assay is intended as an aid in the diagnosis of influenza from Nasopharyngeal swab specimens and should not be used as a sole basis for treatment. Nasal washings and aspirates are unacceptable for Xpert Xpress SARS-CoV-2/FLU/RSV testing.  Fact Sheet for Patients: EntrepreneurPulse.com.au  Fact Sheet for Healthcare Providers: IncredibleEmployment.be  This test is not yet approved or cleared by the [place] FDA and has been authorized for detection and/or diagnosis of SARS-CoV-2 by FDA under an Emergency Use Authorization (EUA). This EUA will remain in effect (meaning this test can be used) for the duration of the COVID-19 declaration under Section 564(b)(1) of the Act, 21 U.S.C. section 360bbb-3(b)(1), unless the authorization is terminated or revoked.  Performed at [facility], [place] [address], [place], [place] [address]   Blood Culture (routine x 2)     Status: None (Preliminary result)   Collection Time: 03/30/21 12:00 PM   Specimen: BLOOD  Result Value Ref Range Status   Specimen Description BLOOD LEFT ANTECUBITAL  Final   Special Requests   Final    BOTTLES DRAWN AEROBIC AND ANAEROBIC Blood Culture adequate volume   Culture   Final    NO GROWTH 2 DAYS Performed at [facility], [place] [address] [place], [place] [address]    Report Status PENDING  Incomplete  Blood Culture (routine x 2)     Status: None (Preliminary result)   Collection Time: 03/30/21 12:00 PM   Specimen: BLOOD  Result Value Ref Range Status   Specimen Description BLOOD RIGHT ANTECUBITAL  Final   Special Requests   Final    BOTTLES DRAWN AEROBIC AND ANAEROBIC Blood Culture results may not be optimal due to  an inadequate volume of blood received in culture bottles   Culture   Final    NO GROWTH 2 DAYS Performed at [facility], [place] [address], [place], [place] [address]  Report Status PENDING  Incomplete     Labs:  CBC: Recent Labs  Lab 03/30/21 1200 03/30/21 1248 03/30/21 1638 03/31/21 0639 04/01/21 0041 04/02/21 0056  WBC 9.5  --   --  7.3 7.4 5.4  NEUTROABS 5.4  --   --   --   --   --   HGB 12.3 12.9 11.9* 11.2* 11.1* 11.0*  HCT 40.4 38.0 35.0* 36.5 36.3 36.4  MCV 102.3*  --   --  99.7 99.7 98.4  PLT 119*  --   --  98* 106* 126*   BMP &GFR Recent Labs  Lab 03/30/21 1200 03/30/21 1248 03/30/21 1336 03/30/21 1638 03/31/21 0639 04/01/21 0041 04/02/21 0056  NA 143 142  --  140 141 140 139  K 3.1* 3.3*  --  3.2* 3.6 4.8 4.1  CL 100  --   --   --  104 103 99  CO2 32  --   --   --  29 28 32  GLUCOSE 130*  --   --   --  119* 139* 157*  BUN 17  --   --   --  9 7* 7*  CREATININE 2.22*  --   --   --  0.88 0.67 0.79  CALCIUM 8.2*  --   --   --  7.8* 8.1* 8.2*  MG  --   --  1.6*  --  1.8 1.9 1.9  PHOS  --   --   --   --   --  1.9* 2.9   Estimated Creatinine Clearance: 65.5 mL/min (by C-G formula based on SCr of 0.79 mg/dL). Liver & Pancreas: Recent Labs  Lab 03/30/21 1200 04/01/21 0041 04/02/21 0056  AST 20  --   --   ALT 22  --   --   ALKPHOS 74  --   --   BILITOT 0.8  --   --   PROT 5.4*  --   --   ALBUMIN 3.0* 2.5* 2.6*   No results for input(s): LIPASE, AMYLASE in the last 168 hours. Recent Labs  Lab 03/30/21 1152  AMMONIA <10   Diabetic: Recent Labs    04/02/21 0056  HGBA1C 6.5*   Recent Labs  Lab 04/01/21 1034 04/01/21 1229 04/01/21 1733 04/01/21 2112 04/02/21 0830  GLUCAP 126* 112* 184* 187* 157*   Cardiac Enzymes: Recent Labs  Lab 04/01/21 0041  CKTOTAL 47   No results for input(s): PROBNP in the last 8760 hours. Coagulation Profile: Recent Labs  Lab 03/30/21 1200  INR 1.0   Thyroid Function Tests: No results for  input(s): TSH, T4TOTAL, FREET4, T3FREE, THYROIDAB in the last 72 hours. Lipid Profile: No results for input(s): CHOL, HDL, LDLCALC, TRIG, CHOLHDL, LDLDIRECT in the last 72 hours. Anemia Panel: No results for input(s): VITAMINB12, FOLATE, FERRITIN, TIBC, IRON, RETICCTPCT in the last 72 hours. Urine analysis:    Component Value Date/Time   COLORURINE AMBER (A) 03/30/2021 1200   APPEARANCEUR CLOUDY (A) 03/30/2021 1200   LABSPEC 1.018 03/30/2021 1200   PHURINE 5.0 03/30/2021 1200   GLUCOSEU NEGATIVE 03/30/2021 1200   HGBUR SMALL (A) 03/30/2021 1200   BILIRUBINUR NEGATIVE 03/30/2021 1200   BILIRUBINUR 1+ 01/30/2021 1450   KETONESUR NEGATIVE 03/30/2021 1200   PROTEINUR 30 (A) 03/30/2021 1200   UROBILINOGEN negative (A) 01/30/2021 1450   UROBILINOGEN 0.2 11/28/2019 1424   NITRITE NEGATIVE 03/30/2021 1200   LEUKOCYTESUR LARGE (A) 03/30/2021 1200   Sepsis Labs: Invalid input(s): PROCALCITONIN, LACTICIDVEN  Time coordinating discharge: 55 minutes  SIGNED:  Mercy Riding, MD  [facility] 04/02/2021, 11:23 AM

## 2021-04-03 ENCOUNTER — Telehealth: Payer: Self-pay | Admitting: Registered Nurse

## 2021-04-03 ENCOUNTER — Encounter (HOSPITAL_COMMUNITY): Payer: Self-pay

## 2021-04-03 DIAGNOSIS — I5033 Acute on chronic diastolic (congestive) heart failure: Secondary | ICD-10-CM | POA: Diagnosis not present

## 2021-04-03 DIAGNOSIS — N189 Chronic kidney disease, unspecified: Secondary | ICD-10-CM | POA: Diagnosis not present

## 2021-04-03 DIAGNOSIS — G9341 Metabolic encephalopathy: Secondary | ICD-10-CM | POA: Diagnosis not present

## 2021-04-03 DIAGNOSIS — E1165 Type 2 diabetes mellitus with hyperglycemia: Secondary | ICD-10-CM | POA: Diagnosis not present

## 2021-04-03 DIAGNOSIS — N3000 Acute cystitis without hematuria: Secondary | ICD-10-CM | POA: Diagnosis not present

## 2021-04-03 DIAGNOSIS — M5440 Lumbago with sciatica, unspecified side: Secondary | ICD-10-CM | POA: Diagnosis not present

## 2021-04-03 DIAGNOSIS — N390 Urinary tract infection, site not specified: Secondary | ICD-10-CM | POA: Diagnosis not present

## 2021-04-03 DIAGNOSIS — M05742 Rheumatoid arthritis with rheumatoid factor of left hand without organ or systems involvement: Secondary | ICD-10-CM | POA: Diagnosis not present

## 2021-04-03 DIAGNOSIS — M2550 Pain in unspecified joint: Secondary | ICD-10-CM | POA: Diagnosis not present

## 2021-04-03 DIAGNOSIS — M6281 Muscle weakness (generalized): Secondary | ICD-10-CM | POA: Diagnosis not present

## 2021-04-03 DIAGNOSIS — Z741 Need for assistance with personal care: Secondary | ICD-10-CM | POA: Diagnosis not present

## 2021-04-03 DIAGNOSIS — J849 Interstitial pulmonary disease, unspecified: Secondary | ICD-10-CM | POA: Diagnosis not present

## 2021-04-03 DIAGNOSIS — U071 COVID-19: Secondary | ICD-10-CM | POA: Diagnosis not present

## 2021-04-03 DIAGNOSIS — I10 Essential (primary) hypertension: Secondary | ICD-10-CM | POA: Diagnosis not present

## 2021-04-03 DIAGNOSIS — R2681 Unsteadiness on feet: Secondary | ICD-10-CM | POA: Diagnosis not present

## 2021-04-03 DIAGNOSIS — M1A09X0 Idiopathic chronic gout, multiple sites, without tophus (tophi): Secondary | ICD-10-CM | POA: Diagnosis not present

## 2021-04-03 DIAGNOSIS — J8410 Pulmonary fibrosis, unspecified: Secondary | ICD-10-CM | POA: Diagnosis not present

## 2021-04-03 DIAGNOSIS — R1312 Dysphagia, oropharyngeal phase: Secondary | ICD-10-CM | POA: Diagnosis not present

## 2021-04-03 DIAGNOSIS — M05741 Rheumatoid arthritis with rheumatoid factor of right hand without organ or systems involvement: Secondary | ICD-10-CM | POA: Diagnosis not present

## 2021-04-03 DIAGNOSIS — J9611 Chronic respiratory failure with hypoxia: Secondary | ICD-10-CM | POA: Diagnosis not present

## 2021-04-03 DIAGNOSIS — E1140 Type 2 diabetes mellitus with diabetic neuropathy, unspecified: Secondary | ICD-10-CM | POA: Diagnosis not present

## 2021-04-03 DIAGNOSIS — I5032 Chronic diastolic (congestive) heart failure: Secondary | ICD-10-CM | POA: Diagnosis not present

## 2021-04-03 DIAGNOSIS — E440 Moderate protein-calorie malnutrition: Secondary | ICD-10-CM | POA: Diagnosis not present

## 2021-04-03 DIAGNOSIS — A419 Sepsis, unspecified organism: Secondary | ICD-10-CM | POA: Diagnosis not present

## 2021-04-03 DIAGNOSIS — G8929 Other chronic pain: Secondary | ICD-10-CM | POA: Diagnosis not present

## 2021-04-03 DIAGNOSIS — M069 Rheumatoid arthritis, unspecified: Secondary | ICD-10-CM | POA: Diagnosis not present

## 2021-04-03 DIAGNOSIS — N179 Acute kidney failure, unspecified: Secondary | ICD-10-CM | POA: Diagnosis not present

## 2021-04-03 DIAGNOSIS — Z7401 Bed confinement status: Secondary | ICD-10-CM | POA: Diagnosis not present

## 2021-04-03 DIAGNOSIS — G9340 Encephalopathy, unspecified: Secondary | ICD-10-CM | POA: Diagnosis not present

## 2021-04-03 DIAGNOSIS — M545 Low back pain, unspecified: Secondary | ICD-10-CM | POA: Diagnosis not present

## 2021-04-03 DIAGNOSIS — D696 Thrombocytopenia, unspecified: Secondary | ICD-10-CM | POA: Diagnosis not present

## 2021-04-03 DIAGNOSIS — E039 Hypothyroidism, unspecified: Secondary | ICD-10-CM | POA: Diagnosis not present

## 2021-04-03 DIAGNOSIS — E782 Mixed hyperlipidemia: Secondary | ICD-10-CM | POA: Diagnosis not present

## 2021-04-03 LAB — GLUCOSE, CAPILLARY
Glucose-Capillary: 110 mg/dL — ABNORMAL HIGH (ref 70–99)
Glucose-Capillary: 115 mg/dL — ABNORMAL HIGH (ref 70–99)
Glucose-Capillary: 195 mg/dL — ABNORMAL HIGH (ref 70–99)

## 2021-04-03 MED ORDER — GABAPENTIN 600 MG PO TABS: 600.0000 mg | ORAL_TABLET | Freq: Three times a day (TID) | ORAL | Status: DC

## 2021-04-03 NOTE — TOC Transition Note (Signed)
Transition of Care [facility]) - CM/SW Discharge Note   Patient Details  Name: [name] MRN: [id number] Date of Birth: 1940/08/29  Transition of Care [facility]) CM/SW Contact:  Emeterio Reeve, LCSW Phone Number: 04/03/2021, 2:40 PM   Clinical Narrative:     Per MD patient ready for DC to [facility]. RN, patient, patient's family, and facility notified of DC. Discharge Summary and FL2 sent to facility. Pt did not require insurance auth. Pt is covid negative. Ambulance transport requested for patient.    RN to call report to [phone]. Pt will go to room 216.  CSW will sign off for now as social work intervention is no longer needed. Please consult [place] again if new needs arise.   Final next level of care: Skilled Nursing Facility Barriers to Discharge: Barriers Resolved   Patient Goals and CMS Choice Patient states their goals for this hospitalization and ongoing recovery are:: to get stronger CMS Medicare.gov Compare Post Acute Care list provided to:: Patient Choice offered to / list presented to : Patient  Discharge Placement              Patient chooses bed at: [facility] Patient to be transferred to facility by: [facility] Name of family member notified: Daughter [name] Patient and family notified of of transfer: 04/03/21  Discharge Plan and Services                                     Social Determinants of Health (SDOH) Interventions     Readmission Risk Interventions Readmission Risk Prevention Plan 04/26/2019  Transportation Screening Complete  PCP or Specialist Appt within 3-5 Days Complete  HRI or [place] Complete  Social Work Consult for [place] Planning/Counseling Complete  Palliative Care Screening Not Applicable  Medication Review [employer]) Complete  Some recent data might be hidden    Emeterio Reeve, LCSW Clinical Social Worker

## 2021-04-03 NOTE — Care Management Important Message (Signed)
Important Message  Patient Details  Name: [name] MRN: [id number] Date of Birth: 1940-06-08   Medicare Important Message Given:        [name] 04/03/2021, 2:56 PM

## 2021-04-03 NOTE — Progress Notes (Signed)
Attempted to call report to [facility] without success.  Patient discharged in stable condition via PTAR to [place].  Will attempt to call again shortly.  [name]

## 2021-04-03 NOTE — Progress Notes (Signed)
Mobility Specialist Progress Note   04/03/21 1115  Mobility  Activity Ambulated in room;Ambulated in hall  Level of Assistance Standby assist, set-up cues, supervision of patient - no hands on  Assistive Device Front wheel walker (w/ chair follow)  Distance Ambulated (ft) 40 ft  Mobility Ambulated with assistance in room;Ambulated with assistance in hallway  Mobility Response Tolerated well  Mobility performed by Mobility specialist  Bed Position Chair  $Mobility charge 1 Mobility   Received pt on EOB prepping for d/c home, having no initial complaints and agreeable to mobility session. X2 seated breaks d/t rapid fatigue and slight back pain, pt requested to be pushed back by chair. Pt returned to room in chair w/ call bell by side and all needs met.   Holland Falling Mobility Specialist Phone Number [phone]

## 2021-04-03 NOTE — Discharge Summary (Addendum)
Physician Discharge Summary  [name] CLE:[id number] DOB: 1940-08-27 DOA: 03/30/2021  PCP: Joan Ruddy, MD  Admit date: 03/30/2021 Discharge date: 04/03/2021 Admitted From: Home Disposition: SNF Recommendations for Outpatient Follow-up:  Follow ups as below. Please obtain CBC/BMP/Mag at follow up Outpatient follow-up with pain clinic in 1 to 2 weeks or sooner if needed Please follow up on the following pending results: None  Discharge Condition: Stable CODE STATUS: DNR/DNI  Hospital Course: 80 year old F with PMH of ILD/chronic hypoxic RF on 2 L at night, DM-2 with neuropathy, chronic back pain/spinal stenosis with radiculopathy, diastolic CHF, CKD-2, HTN, hypothyroidism, RA on prednisone, anxiety and depression brought to ED by EMS due to increased confusion and poor p.o. intake for 1 day.  Reportedly had her morphine increased by pain management clinic recently, and has been sleepier and not eating well since then.  Also had abdominal pain, loose stools and decreased urine output for 3 to 4 days.    In ED, febrile to 100.5.  WBC 9.5.  Lactic acid normal.  Cr 2.2 (baseline 0.7-0.8).  K3.1.  Mg 1.6.  BNP and ammonia normal.  CXR consistent with underlying ILD and unchanged from baseline.  CT head without acute intracranial finding.  UA with large LE, small Hgb and rare bacteria.  VBG 7.37/66/98/38.  Repeat ABG 4 hours later 7.46/46/70/33.  Cultures obtained.  Received IV boluses.  Started on IVF and IV ceftriaxone for possible sepsis due to possible UTI, and admitted.    The next day, AKI and encephalopathy resolved.  Slightly tachycardic at rest.  Blood cultures negative.  Urine culture with Klebsiella pneumonia.  Patient was continued on IV ceftriaxone for 3 days and transition to p.o. Keflex for 2 more days to complete treatment course for Klebsiella pneumonia UTI.  Patient had some chest tightness, dyspnea and edema from IV fluid resuscitation and holding diuretics in the  setting of AKI.  Received IV Lasix 60 mg x 1 with resolution of his symptoms, now restarted on home p.o. Lasix.  Patient is discharged to SNF as recommended by therapy.  See individual problem list below for more on hospital course.  Discharge Diagnoses:  Acute toxic and metabolic encephalopathy-likely from polypharmacy in the setting of renal failure and possible severe sepsis due to UTI.  Patient is on MS Contin, MS IR, gabapentin and Flexeril.  She states her pain doctor increased MS IR from twice daily to 3 times daily for breakthrough pain.  Encephalopathy resolved. -Extensive discussion about her risk for polypharmacy. I suggested cutting down on her pain medication and reviewed the minimum to take the pain edge off. It is a very difficult situation for her.  She states surgery is off the table due to her lung.  Physical activity limited by pain.  Only option is watching her diet, physical therapy and pain meds. -Encouraged to discuss different pain meds that are not renal dependent for clearance with her pain specialist. -For now, decreased MS IR to twice daily for breakthrough pain -Continue other pain medications -Reorientation and delirium precautions.   Severe sepsis due to Klebsiella UTI-difficult diagnosis without urinary symptoms but she has fever and tachycardia with encephalopathy, AKI and thrombocytopenia on presentation.  UA with pyuria and rare bacteria.  Urine culture with Klebsiella pneumonia.  Blood cultures NGTD.  Sepsis physiology resolved.   -Received IV ceftriaxone for 3 days.  P.o. Keflex for 2 more days, through 04/04/2021.   Interstitial lung disease/chronic hypoxic respiratory failure/respiratory acidosis: seems compensated.  Patient is on oxygen 2 L by [place] and opiates which could contribute to respiratory acidosis.  She says she tested negative for sleep apnea.  Resolved. -Recommended minimum oxygen to keep saturation above 88% -Continue DuoNeb -IS/OOB/PT/OT    AKI-likely prerenal insult from poor p.o. intake along with concurrent use of diuretics and ARB.  She denies NSAID use but she is on full dose aspirin.  AKI resolved. Recent Labs    01/16/21 1535 01/16/21 1557 01/17/21 0356 01/18/21 0324 01/19/21 0211 01/24/21 1156 03/30/21 1200 03/31/21 0639 04/01/21 0041 04/02/21 0056  BUN 21 27* _0 7* 7*  CREATININE 1.61* 1.60* 1.05* 0.78 0.79 0.78 2.22* 0.88 0.67 0.79  -Recheck renal function at follow-up     Acute on chronic diastolic CHF: TTE in 01/7587 with LVEF of 60 to 65%.  No mention of diastolic function.  Right systolic function normal.  Likely due to aggressive fluid resuscitation for severe sepsis on arrival.  Home diuretics were on hold.  She seems to have some swelling in her arms or legs but difficult to assess fluid status due to body habitus.  BNP within normal but unreliable due to body habitus.  Troponin negative.  Excellent urine output with IV Lasix.  Symptoms resolved.  Appears euvolemic -Back on home p.o. Lasix 80 mg daily -Continue holding metolazone.  Takes as needed at home. -Monitor fluid status, renal functions and electrolytes. -Start sodium and fluid restriction.   Essential hypertension/sinus tachycardia: Improved. -Continue home Toprol-XL, irbesartan and diuretics   Controlled NIDDM-2 with hyperglycemia and neuropathy: A1c 6.5%. Recent Labs  Lab 04/02/21 1317 04/02/21 1656 04/02/21 2050 04/03/21 0800 04/03/21 1127  GLUCAP 156* 231* 146* 110* 115*  -Continue home Ozempic, statin and gabapentin.   Chronic back pain/spinal stenosis with radiculopathy/lower extremity weakness-followed by pain clinic.  Not able to have surgery due to he will respiratory failure and ILD.  It seems she was on MSIR before, and MS quarantine was added recently.  No red flags. -Continue home MS Contin, gabapentin and Cymbalta -Resume home MSIR twice daily as needed. -Outpatient follow-up with a pain  specialist -Continue bowel regimen for constipation -IS/OOB/PT/OT   History of rheumatoid arthritis -Continue home prednisone, Arava and Plaquenil.   Anxiety and depression -Continue home Cymbalta   At risk for polypharmacy -See discussion above.   Thrombocytopenia: Seems to be acute on chronic.  Likely from sepsis.  Improved. Recent Labs  Lab 03/30/21 1200 03/31/21 0639 04/01/21 0041 04/02/21 0056  PLT 119* 98* 106* 126*  -Recheck CBC at follow-up.     Morbid obesity: Reportedly lost about 70 pounds.  Congratulated. -Continue Ozempic -Could benefit from help with dietitian at [facility]. Body mass index is 37.69 kg/m. Nutrition Problem: Increased nutrient needs Etiology: wound healing, chronic illness Signs/Symptoms: estimated needs Interventions: MVI, Ensure Enlive (each supplement provides 350kcal and 20 grams of protein)  Pressure Injury 03/30/21 Ischial tuberosity Right;Left;Posterior Stage 1 -  Intact skin with non-blanchable redness of a localized area usually over a bony prominence. (Active)  03/30/21 1800  Location: Ischial tuberosity  Location Orientation: Right;Left;Posterior  Staging: Stage 1 -  Intact skin with non-blanchable redness of a localized area usually over a bony prominence.  Wound Description (Comments):   Present on Admission: Yes    Discharge Exam: Vitals:   04/02/21 1617 04/02/21 1955 04/03/21 0429 04/03/21 0746  BP: 132/80 (!) 145/80 (!) 144/84 (!) 127/94  Pulse: 92 94 85 87  Temp: 97.8 F (36.6 C) 98.3 F (  36.8 C) 98.2 F (36.8 C) 97.8 F (36.6 C)  Resp: _0 Weight:   99.6 kg   SpO2: 100% 94% 97% 94%  TempSrc: Oral Oral Oral Oral  BMI (Calculated):   37.67      GENERAL: No apparent distress.  Nontoxic. HEENT: MMM.  Vision and hearing grossly intact.  NECK: Supple.  No apparent JVD.  RESP: 95% on RA.  No IWOB.  Fair aeration bilaterally. CVS:  RRR. Heart sounds normal.  ABD/GI/GU: BS+. Abd soft, NTND.  MSK/EXT:  Moves  extremities. No apparent deformity. No edema.  SKIN: no apparent skin lesion or wound NEURO: Awake and alert. Oriented appropriately.  No apparent focal neuro deficit except for LLE weakness (chronic). PSYCH: Calm. Normal affect.   Discharge Instructions   Allergies as of 04/03/2021       Reactions   Codeine Swelling, Other (See Comments)   Facial swelling Chest pain and swelling in legs Facial swelling   Infliximab Anaphylaxis   "sent me into shock" "sent me into shock"   Lisinopril Swelling   Face and neck swelling Face and neck swelling        Medication List     TAKE these medications    acetaminophen 500 MG tablet Commonly known as: TYLENOL Take 1,000 mg by mouth every 6 (six) hours as needed for moderate pain.   AeroChamber MV inhaler Use as instructed   albuterol 108 (90 Base) MCG/ACT inhaler Commonly known as: VENTOLIN HFA Inhale 2 puffs into the lungs every 6 (six) hours as needed for wheezing or shortness of breath.   allopurinol 300 MG tablet Commonly known as: ZYLOPRIM Take 300 mg by mouth daily.   atorvastatin 20 MG tablet Commonly known as: LIPITOR Take 0.5 tablets (10 mg total) by mouth every evening.   Blood Pressure Monitor/Arm Devi To check blood pressure up to 3 times daily.   Bufferin 325 MG Tabs tablet Generic drug: aspirin buffered Take 325 mg by mouth daily.   CALCIUM + D3 PO Take 2 tablets by mouth daily with lunch.   cephALEXin 500 MG capsule Commonly known as: KEFLEX Take 1 capsule (500 mg total) by mouth every 8 (eight) hours for 2 days.   cholecalciferol 1000 units tablet Commonly known as: VITAMIN D Take 1,000 Units by mouth daily with lunch.   colchicine 0.6 MG tablet Take 0.6 mg by mouth daily as needed (gout flares).   cyclobenzaprine 5 MG tablet Commonly known as: FLEXERIL Take 1 tablet (5 mg total) by mouth at bedtime as needed for muscle spasms.   diclofenac sodium 1 % Gel Commonly known as: VOLTAREN Apply  2 g topically 3 (three) times daily. Both hands and knees   DRY EYES OP Apply 1 drop to eye daily as needed (dry eyes).   DULoxetine 60 MG capsule Commonly known as: CYMBALTA Take 1 capsule (60 mg total) by mouth daily.   fluticasone 50 MCG/ACT nasal spray Commonly known as: FLONASE Place 2 sprays into both nostrils daily.   furosemide 80 MG tablet Commonly known as: LASIX Take 1 tablet (80 mg total) by mouth daily.   gabapentin 600 MG tablet Commonly known as: NEURONTIN Take 1 tablet (600 mg total) by mouth 3 (three) times daily.   guaiFENesin 600 MG 12 hr tablet Commonly known as: MUCINEX Take 1,200 mg by mouth 2 (two) times daily.   hydroxychloroquine 200 MG tablet Commonly known as: PLAQUENIL Take 200 mg by mouth daily.   ipratropium-albuterol 0.5-2.5 (  3) MG/3ML Soln Commonly known as: DUONEB Take 3 mLs by nebulization every 6 (six) hours as needed (cough/wheezing).   leflunomide 20 MG tablet Commonly known as: ARAVA Take 1 tablet (20 mg total) by mouth daily.   levothyroxine 50 MCG tablet Commonly known as: SYNTHROID Take 1 tablet (50 mcg total) by mouth daily.   linaclotide 72 MCG capsule Commonly known as: Linzess Take 1 capsule (72 mcg total) by mouth daily before breakfast.   metolazone 2.5 MG tablet Commonly known as: ZAROXOLYN TAKE 1 TABLET BY MOUTH DAILY AS NEEDED FOR 2 DAYS FOR WEIGHT ABOVE 255 POUNDS What changed: See the new instructions.   metoprolol succinate 25 MG 24 hr tablet Commonly known as: TOPROL-XL Take 5 tablets (125 mg total) by mouth daily.   morphine 15 MG tablet Commonly known as: MSIR One tablet twice a day as needed for break through pain. What changed: Another medication with the same name was changed. Make sure you understand how and when to take each.   morphine 15 MG 12 hr tablet Commonly known as: MS CONTIN Take 1 tablet (15 mg total) by mouth every 12 (twelve) hours. What changed: additional instructions    multivitamin with minerals Tabs tablet Take 1 tablet by mouth daily.   olmesartan 40 MG tablet Commonly known as: BENICAR Take 1 tablet (40 mg total) by mouth daily.   OXYGEN Inhale 2 L into the lungs at bedtime. And on exertion   Ozempic (0.25 or 0.5 MG/DOSE) 2 MG/1.5ML Sopn Generic drug: Semaglutide(0.25 or 0.5MG/DOS) Inject 0.25 mg as directed once a week.   pantoprazole 40 MG tablet Commonly known as: PROTONIX take 1 tablet by mouth once daily MINUTES BEFORE 1ST MEAL OF THE DAY What changed:  how much to take how to take this when to take this additional instructions   Potassium Chloride ER 20 MEQ Tbcr Take 20 mEq by mouth daily.   predniSONE 5 MG tablet Commonly known as: DELTASONE Take 10 mg by mouth every morning.   sodium chloride 0.65 % Soln nasal spray Commonly known as: OCEAN Place 1 spray into both nostrils as needed for congestion.        Consultations: None  Procedures/Studies: None   CT ABDOMEN PELVIS WO CONTRAST  Result Date: 03/31/2021 CLINICAL DATA:  Abdominal pain, sepsis EXAM: CT ABDOMEN AND PELVIS WITHOUT CONTRAST TECHNIQUE: Multidetector CT imaging of the abdomen and pelvis was performed following the standard protocol without IV contrast. COMPARISON:  CTA abdomen/pelvis dated 01/16/2021 FINDINGS: Lower chest: Emphysematous changes at the lung bases. Hepatobiliary: Unenhanced liver is grossly unremarkable. Layering gallbladder sludge (series 3/image 27), without associated inflammatory changes. No intrahepatic or extrahepatic duct dilatation. Pancreas: Within normal limits. Spleen: Within normal limits. Adrenals/Urinary Tract: Adrenal glands are within normal limits. Bilateral renal cysts, including a 12 mm hemorrhagic cyst in the medial left upper kidney (series 3/image 31) and simple cysts measuring up to 5.3 cm in the lateral right lower kidney (series 3/image 46). Three nonobstructing right lower pole renal calculi measuring up to 2 mm. No  hydronephrosis. Bladder is notable for trace nondependent gas but is otherwise within normal limits. Stomach/Bowel: Stomach is within normal limits. No evidence of bowel obstruction. Appendix is not discretely visualized. No colonic wall thickening or inflammatory changes. Vascular/Lymphatic: No evidence of abdominal aortic aneurysm. Atherosclerotic calcifications of the abdominal aorta and branch vessels. To lymph nodes Reproductive: Status post hysterectomy. No adnexal masses. Other: No abdominopelvic ascites. Musculoskeletal: Degenerative changes of the visualized thoracolumbar spine. Status post  ORIF of the left hip. IMPRESSION: No CT findings to account for the patient's abdominal pain. Gallbladder sludge, without associated inflammatory changes. Three nonobstructing right lower pole renal calculi measure up to 2 mm. No hydronephrosis. Additional ancillary findings as above. Electronically Signed   By: Julian Hy M.D.   On: 03/31/2021 01:01   CT HEAD WO CONTRAST (5MM)  Result Date: 03/30/2021 CLINICAL DATA:  Mental status change EXAM: CT HEAD WITHOUT CONTRAST TECHNIQUE: Contiguous axial images were obtained from the base of the skull through the vertex without intravenous contrast. COMPARISON:  Head CT dated January 16, 2021 FINDINGS: Brain: Chronic white matter ischemic change. No evidence of acute infarction, hemorrhage, hydrocephalus, extra-axial collection or mass lesion/mass effect. Vascular: No hyperdense vessel or unexpected calcification. Skull: Normal. Negative for fracture or focal lesion. Sinuses/Orbits: No acute finding. Other: None. IMPRESSION: No acute intracranial abnormality. Electronically Signed   By: Yetta Glassman M.D.   On: 03/30/2021 12:36   DG Chest Port 1 View  Result Date: 03/30/2021 CLINICAL DATA:  81 year old female with possible sepsis. EXAM: PORTABLE CHEST 1 VIEW COMPARISON:  Chest x-ray 01/16/2021. FINDINGS: Lung volumes are low. Widespread areas of interstitial  prominence and diffuse peribronchial cuffing noted in the lungs bilaterally, most severe there out the mid to lower lungs. No definite confluent consolidative airspace disease. No pleural effusions. No pneumothorax. Crowding of the pulmonary vasculature. No evidence of pulmonary edema. Heart size is normal. Mediastinal contours are distorted by patient positioning. IMPRESSION: 1. The appearance the chest is very similar to the prior chest radiograph, suggestive of interstitial lung disease. Outpatient referral to Pulmonology for further clinical evaluation is recommended. Additionally, follow-up nonemergent high-resolution chest CT is recommended in the near future to better evaluate these findings. Electronically Signed   By: Vinnie Langton M.D.   On: 03/30/2021 12:27       The results of significant diagnostics from this hospitalization (including imaging, microbiology, ancillary and laboratory) are listed below for reference.     Microbiology: Recent Results (from the past 240 hour(s))  Urine Culture     Status: Abnormal   Collection Time: 03/30/21 11:51 AM   Specimen: In/Out Cath Urine  Result Value Ref Range Status   Specimen Description IN/OUT CATH URINE  Final   Special Requests NONE  Final   Culture (A)  Final    80,000 COLONIES/mL KLEBSIELLA PNEUMONIAE Two isolates with different morphologies were identified as the same organism.The most resistant organism was reported. Performed at [facility], [place] [address], [place], [place] [address]    Report Status 04/02/2021 FINAL  Final   Organism ID, Bacteria KLEBSIELLA PNEUMONIAE (A)  Final      Susceptibility   Klebsiella pneumoniae - MIC*    AMPICILLIN RESISTANT Resistant     CEFAZOLIN <=4 SENSITIVE Sensitive     CEFEPIME <=0.12 SENSITIVE Sensitive     CEFTRIAXONE <=0.25 SENSITIVE Sensitive     CIPROFLOXACIN <=0.25 SENSITIVE Sensitive     GENTAMICIN <=1 SENSITIVE Sensitive     IMIPENEM <=0.25 SENSITIVE Sensitive      NITROFURANTOIN 128 RESISTANT Resistant     TRIMETH/SULFA >=320 RESISTANT Resistant     AMPICILLIN/SULBACTAM 4 SENSITIVE Sensitive     PIP/TAZO <=4 SENSITIVE Sensitive     * 80,000 COLONIES/mL KLEBSIELLA PNEUMONIAE  Resp Panel by RT-PCR (Flu A&B, Covid) Nasopharyngeal Swab     Status: None   Collection Time: 03/30/21 11:51 AM   Specimen: Nasopharyngeal Swab; Nasopharyngeal(NP) swabs in vial transport medium  Result Value  Ref Range Status   SARS Coronavirus 2 by RT PCR NEGATIVE NEGATIVE Final    Comment: (NOTE) SARS-CoV-2 target nucleic acids are NOT DETECTED.  The SARS-CoV-2 RNA is generally detectable in upper respiratory specimens during the acute phase of infection. The lowest concentration of SARS-CoV-2 viral copies this assay can detect is 138 copies/mL. A negative result does not preclude SARS-Cov-2 infection and should not be used as the sole basis for treatment or other patient management decisions. A negative result may occur with  improper specimen collection/handling, submission of specimen other than nasopharyngeal swab, presence of viral mutation(s) within the areas targeted by this assay, and inadequate number of viral copies(<138 copies/mL). A negative result must be combined with clinical observations, patient history, and epidemiological information. The expected result is Negative.  Fact Sheet for Patients:  EntrepreneurPulse.com.au  Fact Sheet for Healthcare Providers:  IncredibleEmployment.be  This test is no t yet approved or cleared by the [place] FDA and  has been authorized for detection and/or diagnosis of SARS-CoV-2 by FDA under an Emergency Use Authorization (EUA). This EUA will remain  in effect (meaning this test can be used) for the duration of the COVID-19 declaration under Section 564(b)(1) of the Act, 21 U.S.C.section 360bbb-3(b)(1), unless the authorization is terminated  or revoked sooner.        Influenza A by PCR NEGATIVE NEGATIVE Final   Influenza B by PCR NEGATIVE NEGATIVE Final    Comment: (NOTE) The Xpert Xpress SARS-CoV-2/FLU/RSV plus assay is intended as an aid in the diagnosis of influenza from Nasopharyngeal swab specimens and should not be used as a sole basis for treatment. Nasal washings and aspirates are unacceptable for Xpert Xpress SARS-CoV-2/FLU/RSV testing.  Fact Sheet for Patients: EntrepreneurPulse.com.au  Fact Sheet for Healthcare Providers: IncredibleEmployment.be  This test is not yet approved or cleared by the [place] FDA and has been authorized for detection and/or diagnosis of SARS-CoV-2 by FDA under an Emergency Use Authorization (EUA). This EUA will remain in effect (meaning this test can be used) for the duration of the COVID-19 declaration under Section 564(b)(1) of the Act, 21 U.S.C. section 360bbb-3(b)(1), unless the authorization is terminated or revoked.  Performed at [facility], [place] [address] [place], [place] [address]   Blood Culture (routine x 2)     Status: None (Preliminary result)   Collection Time: 03/30/21 12:00 PM   Specimen: BLOOD  Result Value Ref Range Status   Specimen Description BLOOD LEFT ANTECUBITAL  Final   Special Requests   Final    BOTTLES DRAWN AEROBIC AND ANAEROBIC Blood Culture adequate volume   Culture   Final    NO GROWTH 4 DAYS Performed at [facility], [place] [address], [place], [place] [address]    Report Status PENDING  Incomplete  Blood Culture (routine x 2)     Status: None (Preliminary result)   Collection Time: 03/30/21 12:00 PM   Specimen: BLOOD  Result Value Ref Range Status   Specimen Description BLOOD RIGHT ANTECUBITAL  Final   Special Requests   Final    BOTTLES DRAWN AEROBIC AND ANAEROBIC Blood Culture results may not be optimal due to an inadequate volume of blood received in culture bottles   Culture   Final    NO GROWTH 4  DAYS Performed at [facility], [place] [address], [place], [place] [address]    Report Status PENDING  Incomplete  MRSA Next Gen by PCR, Nasal  Status: None   Collection Time: 04/02/21 12:12 PM   Specimen: Nasopharyngeal Swab; Nasal Swab  Result Value Ref Range Status   MRSA by PCR Next Gen NOT DETECTED NOT DETECTED Final    Comment: (NOTE) The GeneXpert MRSA Assay (FDA approved for NASAL specimens only), is one component of a comprehensive MRSA colonization surveillance program. It is not intended to diagnose MRSA infection nor to guide or monitor treatment for MRSA infections. Test performance is not FDA approved in patients less than 26 years old. Performed at [facility], [place] [address], [place], [place] [address]   Resp Panel by RT-PCR (Flu A&B, Covid) Nasopharyngeal Swab     Status: None   Collection Time: 04/02/21 12:12 PM   Specimen: Nasopharyngeal Swab; Nasopharyngeal(NP) swabs in vial transport medium  Result Value Ref Range Status   SARS Coronavirus 2 by RT PCR NEGATIVE NEGATIVE Final    Comment: (NOTE) SARS-CoV-2 target nucleic acids are NOT DETECTED.  The SARS-CoV-2 RNA is generally detectable in upper respiratory specimens during the acute phase of infection. The lowest concentration of SARS-CoV-2 viral copies this assay can detect is 138 copies/mL. A negative result does not preclude SARS-Cov-2 infection and should not be used as the sole basis for treatment or other patient management decisions. A negative result may occur with  improper specimen collection/handling, submission of specimen other than nasopharyngeal swab, presence of viral mutation(s) within the areas targeted by this assay, and inadequate number of viral copies(<138 copies/mL). A negative result must be combined with clinical observations, patient history, and epidemiological information. The expected result is Negative.  Fact Sheet for Patients:   EntrepreneurPulse.com.au  Fact Sheet for Healthcare Providers:  IncredibleEmployment.be  This test is no t yet approved or cleared by the [place] FDA and  has been authorized for detection and/or diagnosis of SARS-CoV-2 by FDA under an Emergency Use Authorization (EUA). This EUA will remain  in effect (meaning this test can be used) for the duration of the COVID-19 declaration under Section 564(b)(1) of the Act, 21 U.S.C.section 360bbb-3(b)(1), unless the authorization is terminated  or revoked sooner.       Influenza A by PCR NEGATIVE NEGATIVE Final   Influenza B by PCR NEGATIVE NEGATIVE Final    Comment: (NOTE) The Xpert Xpress SARS-CoV-2/FLU/RSV plus assay is intended as an aid in the diagnosis of influenza from Nasopharyngeal swab specimens and should not be used as a sole basis for treatment. Nasal washings and aspirates are unacceptable for Xpert Xpress SARS-CoV-2/FLU/RSV testing.  Fact Sheet for Patients: EntrepreneurPulse.com.au  Fact Sheet for Healthcare Providers: IncredibleEmployment.be  This test is not yet approved or cleared by the [place] FDA and has been authorized for detection and/or diagnosis of SARS-CoV-2 by FDA under an Emergency Use Authorization (EUA). This EUA will remain in effect (meaning this test can be used) for the duration of the COVID-19 declaration under Section 564(b)(1) of the Act, 21 U.S.C. section 360bbb-3(b)(1), unless the authorization is terminated or revoked.  Performed at [facility], [place] [address], [place], [place] [address]      Labs:  CBC: Recent Labs  Lab 03/30/21 1200 03/30/21 1248 03/30/21 1638 03/31/21 0639 04/01/21 0041 04/02/21 0056  WBC 9.5  --   --  7.3 7.4 5.4  NEUTROABS 5.4  --   --   --   --   --   HGB 12.3 12.9 11.9* 11.2* 11.1* 11.0*  HCT 40.4 38.0 35.0* 36.5 36.3 36.4  MCV 102.3*  --   --  99.7 99.7 98.4  PLT  119*  --   --  98* 106* 126*   BMP &GFR Recent Labs  Lab 03/30/21 1200 03/30/21 1248 03/30/21 1336 03/30/21 1638 03/31/21 0639 04/01/21 0041 04/02/21 0056  NA 143 142  --  140 141 140 139  K 3.1* 3.3*  --  3.2* 3.6 4.8 4.1  CL 100  --   --   --  104 103 99  CO2 32  --   --   --  29 28 32  GLUCOSE 130*  --   --   --  119* 139* 157*  BUN 17  --   --   --  9 7* 7*  CREATININE 2.22*  --   --   --  0.88 0.67 0.79  CALCIUM 8.2*  --   --   --  7.8* 8.1* 8.2*  MG  --   --  1.6*  --  1.8 1.9 1.9  PHOS  --   --   --   --   --  1.9* 2.9   Estimated Creatinine Clearance: 65.4 mL/min (by C-G formula based on SCr of 0.79 mg/dL). Liver & Pancreas: Recent Labs  Lab 03/30/21 1200 04/01/21 0041 04/02/21 0056  AST 20  --   --   ALT 22  --   --   ALKPHOS 74  --   --   BILITOT 0.8  --   --   PROT 5.4*  --   --   ALBUMIN 3.0* 2.5* 2.6*   No results for input(s): LIPASE, AMYLASE in the last 168 hours. Recent Labs  Lab 03/30/21 1152  AMMONIA <10   Diabetic: Recent Labs    04/02/21 0056  HGBA1C 6.5*   Recent Labs  Lab 04/02/21 1317 04/02/21 1656 04/02/21 2050 04/03/21 0800 04/03/21 1127  GLUCAP 156* 231* 146* 110* 115*   Cardiac Enzymes: Recent Labs  Lab 04/01/21 0041  CKTOTAL 47   No results for input(s): PROBNP in the last 8760 hours. Coagulation Profile: Recent Labs  Lab 03/30/21 1200  INR 1.0   Thyroid Function Tests: No results for input(s): TSH, T4TOTAL, FREET4, T3FREE, THYROIDAB in the last 72 hours. Lipid Profile: No results for input(s): CHOL, HDL, LDLCALC, TRIG, CHOLHDL, LDLDIRECT in the last 72 hours. Anemia Panel: No results for input(s): VITAMINB12, FOLATE, FERRITIN, TIBC, IRON, RETICCTPCT in the last 72 hours. Urine analysis:    Component Value Date/Time   COLORURINE AMBER (A) 03/30/2021 1200   APPEARANCEUR CLOUDY (A) 03/30/2021 1200   LABSPEC 1.018 03/30/2021 1200   PHURINE 5.0 03/30/2021 1200   GLUCOSEU NEGATIVE 03/30/2021 1200   HGBUR SMALL  (A) 03/30/2021 1200   BILIRUBINUR NEGATIVE 03/30/2021 1200   BILIRUBINUR 1+ 01/30/2021 1450   KETONESUR NEGATIVE 03/30/2021 1200   PROTEINUR 30 (A) 03/30/2021 1200   UROBILINOGEN negative (A) 01/30/2021 1450   UROBILINOGEN 0.2 11/28/2019 1424   NITRITE NEGATIVE 03/30/2021 1200   LEUKOCYTESUR LARGE (A) 03/30/2021 1200   Sepsis Labs: Invalid input(s): PROCALCITONIN, LACTICIDVEN   Time coordinating discharge: 55 minutes  SIGNED:  Mercy Riding, MD  [facility] 04/03/2021, 2:20 PM

## 2021-04-03 NOTE — Progress Notes (Signed)
Attempted to call report again without success.

## 2021-04-03 NOTE — Telephone Encounter (Signed)
[name] was admitted to [facility]  on 03/30/2021 for Acute toxic and metabolic encephalopathy, discharged summary was reviewed. [name] will be decreased to twice a day as needed for pain. She is being discharged to SNF. Placed a call to [name] home, to speak with her daughter. Received a My-Chart message regarding [name], the person who answered the phone will have [name] to return this provider call.

## 2021-04-04 ENCOUNTER — Encounter: Payer: Self-pay | Admitting: Adult Health

## 2021-04-04 ENCOUNTER — Non-Acute Institutional Stay (SKILLED_NURSING_FACILITY): Payer: Medicare Other | Admitting: Adult Health

## 2021-04-04 DIAGNOSIS — J849 Interstitial pulmonary disease, unspecified: Secondary | ICD-10-CM

## 2021-04-04 DIAGNOSIS — E1140 Type 2 diabetes mellitus with diabetic neuropathy, unspecified: Secondary | ICD-10-CM

## 2021-04-04 DIAGNOSIS — N3000 Acute cystitis without hematuria: Secondary | ICD-10-CM | POA: Diagnosis not present

## 2021-04-04 DIAGNOSIS — G9340 Encephalopathy, unspecified: Secondary | ICD-10-CM | POA: Diagnosis not present

## 2021-04-04 DIAGNOSIS — N179 Acute kidney failure, unspecified: Secondary | ICD-10-CM

## 2021-04-04 DIAGNOSIS — M05742 Rheumatoid arthritis with rheumatoid factor of left hand without organ or systems involvement: Secondary | ICD-10-CM

## 2021-04-04 DIAGNOSIS — I5033 Acute on chronic diastolic (congestive) heart failure: Secondary | ICD-10-CM | POA: Diagnosis not present

## 2021-04-04 DIAGNOSIS — G8929 Other chronic pain: Secondary | ICD-10-CM | POA: Diagnosis not present

## 2021-04-04 DIAGNOSIS — M05741 Rheumatoid arthritis with rheumatoid factor of right hand without organ or systems involvement: Secondary | ICD-10-CM

## 2021-04-04 DIAGNOSIS — I10 Essential (primary) hypertension: Secondary | ICD-10-CM | POA: Diagnosis not present

## 2021-04-04 DIAGNOSIS — M545 Low back pain, unspecified: Secondary | ICD-10-CM | POA: Diagnosis not present

## 2021-04-04 LAB — CULTURE, BLOOD (ROUTINE X 2)
Culture: NO GROWTH
Culture: NO GROWTH
Special Requests: ADEQUATE

## 2021-04-04 NOTE — Consult Note (Signed)
   [facility] CM Inpatient Consult   04/04/2021  Jae SEIRRA KOS 1940/09/20 [id number]  [facility] [ACO] Patient: Medicare CMS DCE  Primary Care Provider:  [name], MD  Late entry for 04/03/21  Patient can be followed by [facility] RN with traditional Medicare.    Plan:   [facility] PAC RN can follow for any known or needs for transitional care needs for returning to post facility care or complex disease management.  For questions or referrals, please contact:   Natividad Brood, RN BSN [facility] Liaison  [phone] business mobile phone Toll free office [phone]  Fax number: [phone] [email or web address]

## 2021-04-04 NOTE — Progress Notes (Addendum)
Location:  [facility] Room Number: [place] of Service:  SNF (31) Provider:  Durenda Age, DNP, FNP-BC  Patient Care Team: [name], MD as PCP - General (Family Medicine) Lorretta Harp, MD as PCP - Cardiology (Cardiology) Duffy, Creola Corn, LCSW as Social Worker (Licensed Clinical Social Worker) Kidney, Nelida Meuse, [place], MD as Consulting Physician (Rheumatology) Efraim Kaufmann, NP (Rheumatology) Theodosia Blender, MD as Referring Physician (Pulmonary Disease)  Extended Emergency Contact Information Primary Emergency Contact: Farrow,Curtis Address: [address]          [place], [place] [address] Johnnette Litter of [place] Phone: [phone] Mobile Phone: [phone] Relation: Son Secondary Emergency Contact: Fonnie Jarvis Address: [address], [place] [address] [place] of [place] Phone: [phone] Mobile Phone: [phone] Relation: Daughter  Code Status:  DNR  Goals of care: Advanced Directive information Advanced Directives 03/30/2021  Does Patient Have a Medical Advance Directive? No  Type of Advance Directive -  Does patient want to make changes to medical advance directive? -  Copy of Central in Chart? -  Would patient like information on creating a medical advance directive? No - Patient declined  Pre-existing out of facility DNR order (yellow form or pink MOST form) -     Chief Complaint  Patient presents with   Hospitalization Follow-up    Admitted to [facility] 04/03/21    HPI:  Pt is a 80 y.o. female who was admitted to [facility] on 04/03/21 post hospitalization 03/30/21 to 04/03/21.  She has a PMH of ILD on 2 L O2 at night, diabetes mellitus type 2 with neuropathy, chronic back pain/spinal stenosis with radiculopathy, diastolic CHF, chronic kidney disease stage III, hypertension, hypothyroidism, RA on  prednisone, anxiety and depression. She was brought to the hospital by EMS due to increased confusion and poor PO intake for 1 day.since then. Her morphine dosage was increased by pain management recently and has been sleepier and not eating well. In the ED, chest x-ray was consistent with underlying ILD, unchanged from baseline.  Labs showed WBC 9.5, lactic acid normal, creatinine 2.2 (baseline 0.7-0.8), K3.1, Mg 1.6, BMP and ammonia normal.  Urinalysis showed large leukocytes, small hemoglobin and rare bacteria.  She was a started on IVF and IV ceftriaxone for possible sepsis due to possible UTI.  Blood cultures were negative and urine culture with Klebsiella pneumonia.  She was continued on ceftriaxone for 3 days then transitioned to  PO Keflex for 2 more days for Klebsiella pneumonia UTI.  IV Lasix 60 mg was given for chest tightness, dyspnea and edema from IV fluid resuscitation and holding diuretics in the setting of AKI.  Symptoms resolved and now restarted on oral Lasix.  Pain medication was cut back, MSIR from TID PRN to BID PRN for breakthrough pain.  She was seen in her room today. She stated that her pain is well-controlled. She has an Afflovest that she uses at home for her ILD. She stated that her son will bring it in so she can use it when she is sitting on her wheelchair. She is being followed up by palliative care.    Past Medical History:  Diagnosis Date   Acute on chronic diastolic (congestive) heart failure (HCC)    Anemia    iron deficiency anemia - secondary to blood loss ( chronic)    Anxiety    Arthritis  endstage changes bilateral knees/bilateral ankles.    Asthma    Carotid artery occlusion    Chronic fatigue    Chronic kidney disease    Closed left hip fracture (HCC)    Clotting disorder ([place])    pt denies this   Contusion of left knee    due to fall 1/14.   COPD (chronic obstructive pulmonary disease) (HCC)    pulmonary fibrosis   Depression, reactive     Diabetes mellitus    type II    Diastolic dysfunction    Difficulty in walking    Family history of heart disease    Generalized muscle weakness    Gout    High cholesterol    History of falling    Hypertension    Hypothyroidism    Interstitial lung disease ([place])    Meningioma of left sphenoid wing involving cavernous sinus ([place]) 02/17/2012   Continue diplopia, left eye pain and left headaches.     Morbid obesity ([place])    Morbid obesity ([place])    Neuromuscular disorder ([place])    diabetic neuropathy    Normal coronary arteries    cardiac catheterization performed  10/31/14   Pneumonia    RA (rheumatoid arthritis) ([place])    has been off methotreaxte since 10/13.   Rheumatoid arthritis (HCC)    Shortness of breath    Spinal stenosis of lumbar region    Thyroid disease    Unspecified lack of coordination    URI (upper respiratory infection)    Past Surgical History:  Procedure Laterality Date   ABDOMINAL HYSTERECTOMY     BRAIN SURGERY     Gamma knife 10/13. Needs repeat spring  '14   CARDIAC CATHETERIZATION N/A 10/31/2014   Procedure: Right/Left Heart Cath and Coronary Angiography;  Surgeon: [name], MD;  Location: [facility];  Service: Cardiovascular;  Laterality: N/A;   ESOPHAGOGASTRODUODENOSCOPY (EGD) WITH PROPOFOL N/A 09/14/2014   Procedure: ESOPHAGOGASTRODUODENOSCOPY (EGD) WITH PROPOFOL;  Surgeon: [name], MD;  Location: [facility];  Service: Endoscopy;  Laterality: N/A;   INCISION AND DRAINAGE HIP Left 01/16/2017   Procedure: IRRIGATION AND DEBRIDEMENT LEFT HIP;  Surgeon: [name], MD;  Location: [facility];  Service: Orthopedics;  Laterality: Left;   INTRAMEDULLARY (IM) NAIL INTERTROCHANTERIC Left 11/29/2016   Procedure: INTRAMEDULLARY (IM) NAIL INTERTROCHANTRIC;  Surgeon: [name], MD;  Location: [facility];  Service: Orthopedics;  Laterality: Left;   OVARY SURGERY     SHOULDER SURGERY Left    TONSILLECTOMY  age 72   VIDEO  BRONCHOSCOPY Bilateral 05/31/2013   Procedure: VIDEO BRONCHOSCOPY WITHOUT FLUORO;  Surgeon: Brand Males, MD;  Location: [facility];  Service: Cardiopulmonary;  Laterality: Bilateral;   video bronscoscopy  2000   lung    Allergies  Allergen Reactions   Codeine Swelling and Other (See Comments)    Facial swelling Chest pain and swelling in legs  Facial swelling   Infliximab Anaphylaxis    "sent me into shock" "sent me into shock"   Lisinopril Swelling    Face and neck swelling Face and neck swelling    Outpatient Encounter Medications as of 04/04/2021  Medication Sig   acetaminophen (TYLENOL) 500 MG tablet Take 1,000 mg by mouth every 6 (six) hours as needed for moderate pain.    albuterol (PROVENTIL HFA;VENTOLIN HFA) 108 (90 BASE) MCG/ACT inhaler Inhale 2 puffs into the lungs every 6 (six) hours as needed for wheezing or shortness of breath.   allopurinol (  ZYLOPRIM) 300 MG tablet Take 300 mg by mouth daily.   Artificial Tear Ointment (DRY EYES OP) Apply 1 drop to eye daily as needed (dry eyes).   aspirin buffered (BUFFERIN) 325 MG TABS tablet Take 325 mg by mouth daily.   atorvastatin (LIPITOR) 20 MG tablet Take 0.5 tablets (10 mg total) by mouth every evening.   Blood Pressure Monitoring (BLOOD PRESSURE MONITOR/ARM) DEVI To check blood pressure up to 3 times daily.   Calcium Carb-Cholecalciferol (CALCIUM + D3 PO) Take 2 tablets by mouth daily with lunch.    cephALEXin (KEFLEX) 500 MG capsule Take 1 capsule (500 mg total) by mouth every 8 (eight) hours for 2 days.   cholecalciferol (VITAMIN D) 1000 UNITS tablet Take 1,000 Units by mouth daily with lunch.   colchicine 0.6 MG tablet Take 0.6 mg by mouth daily as needed (gout flares).   cyclobenzaprine (FLEXERIL) 5 MG tablet Take 1 tablet (5 mg total) by mouth at bedtime as needed for muscle spasms.   diclofenac sodium (VOLTAREN) 1 % GEL Apply 2 g topically 3 (three) times daily. Both hands and knees   DULoxetine (CYMBALTA) 60  MG capsule Take 1 capsule (60 mg total) by mouth daily.   fluticasone (FLONASE) 50 MCG/ACT nasal spray Place 2 sprays into both nostrils daily.   furosemide (LASIX) 80 MG tablet Take 1 tablet (80 mg total) by mouth daily.   gabapentin (NEURONTIN) 600 MG tablet Take 1 tablet (600 mg total) by mouth 3 (three) times daily.   guaiFENesin (MUCINEX) 600 MG 12 hr tablet Take 1,200 mg by mouth 2 (two) times daily.   hydroxychloroquine (PLAQUENIL) 200 MG tablet Take 200 mg by mouth daily.   ipratropium-albuterol (DUONEB) 0.5-2.5 (3) MG/3ML SOLN Take 3 mLs by nebulization every 6 (six) hours as needed (cough/wheezing).    leflunomide (ARAVA) 20 MG tablet Take 1 tablet (20 mg total) by mouth daily.   levothyroxine (SYNTHROID) 50 MCG tablet Take 1 tablet (50 mcg total) by mouth daily.   linaclotide (LINZESS) 72 MCG capsule Take 1 capsule (72 mcg total) by mouth daily before breakfast.   metolazone (ZAROXOLYN) 2.5 MG tablet TAKE 1 TABLET BY MOUTH DAILY AS NEEDED FOR 2 DAYS FOR WEIGHT ABOVE 255 POUNDS (Patient taking differently: Take 2.5 mg by mouth daily as needed (Take for 2 days for weight gain above 255 pounds).)   metoprolol succinate (TOPROL-XL) 25 MG 24 hr tablet Take 5 tablets (125 mg total) by mouth daily.   morphine (MS CONTIN) 15 MG 12 hr tablet Take 1 tablet (15 mg total) by mouth every 12 (twelve) hours.   morphine (MSIR) 15 MG tablet One tablet twice a day as needed for break through pain.   Multiple Vitamin (MULTIVITAMIN WITH MINERALS) TABS Take 1 tablet by mouth daily.    olmesartan (BENICAR) 40 MG tablet Take 1 tablet (40 mg total) by mouth daily.   OXYGEN-HELIUM IN Inhale 2 L into the lungs at bedtime. And on exertion   pantoprazole (PROTONIX) 40 MG tablet take 1 tablet by mouth once daily MINUTES BEFORE 1ST MEAL OF THE DAY (Patient taking differently: Take 40 mg by mouth daily.)   Potassium Chloride ER 20 MEQ TBCR Take 20 mEq by mouth daily.   predniSONE (DELTASONE) 5 MG tablet Take 10 mg by  mouth every morning.    Semaglutide,0.25 or 0.5MG/DOS, (OZEMPIC, 0.25 OR 0.5 MG/DOSE,) 2 MG/1.5ML SOPN Inject 0.25 mg as directed once a week.   sodium chloride (OCEAN) 0.65 % SOLN nasal spray  Place 1 spray into both nostrils as needed for congestion.   Spacer/Aero-Holding Chambers (AEROCHAMBER MV) inhaler Use as instructed   No facility-administered encounter medications on file as of 04/04/2021.    Review of Systems  GENERAL: No change in appetite, no fatigue, no weight changes, no fever or chills  MOUTH and THROAT: Denies oral discomfort, gingival pain or bleeding RESPIRATORY: no cough, SOB, DOE, wheezing, hemoptysis CARDIAC: No chest pain or palpitations GI: No abdominal pain, diarrhea, constipation, heart burn, nausea or vomiting GU: Denies dysuria, frequency, hematuria, incontinence, or discharge NEUROLOGICAL: Denies dizziness, syncope, numbness, or headache PSYCHIATRIC: Denies feelings of depression or anxiety. No report of hallucinations, insomnia, paranoia, or agitation   Immunization History  Administered Date(s) Administered   Fluad Quad(high Dose 65+) 02/02/2019, 03/11/2021   Influenza Split 01/29/2012, 03/30/2013, 02/10/2014   Influenza, High Dose Seasonal PF 02/16/2017, 02/11/2018, 01/31/2019, 02/27/2020   Influenza,inj,Quad PF,6+ Mos 02/09/2015, 12/18/2015   Influenza-Unspecified 02/09/2015, 12/18/2015   PFIZER(Purple Top)SARS-COV-2 Vaccination 07/17/2019, 08/14/2019   PPD Test 04/23/2015   Pneumococcal Conjugate-13 08/21/2014   Pneumococcal Polysaccharide-23 02/17/2012   Tdap 12/27/2015   Pertinent  Health Maintenance Due  Topic Date Due   FOOT EXAM  05/23/2020   OPHTHALMOLOGY EXAM  03/15/2021   HEMOGLOBIN A1C  09/30/2021   INFLUENZA VACCINE  Completed   DEXA SCAN  Completed   Fall Risk 04/01/2021 04/01/2021 04/02/2021 04/03/2021 04/03/2021  Falls in the past year? - - - - -  Number of falls in past year - - - - -  Was there an injury with Fall? - - - - -   Was there an injury with Fall? - - - - -  Fall Risk Category Calculator - - - - -  Fall Risk Category - - - - -  Patient Fall Risk Level _0   Patient at Risk for Falls Due to - - - - -  Patient at Risk for Falls Due to - - - - -  Fall risk Follow up - - - - -  Fall risk Follow up - - - - -     Vitals:   04/04/21 1000  BP: 128/81  Pulse: 86  Resp: 17  Temp: (!) 97.3 F (36.3 C)  Weight: 227 lb (103 kg)  Height: 5' 4" (1.626 m)   Body mass index is 38.96 kg/m.  Physical Exam  GENERAL APPEARANCE: Well nourished. In no acute distress. Morbidly obese SKIN:  Skin is warm and dry.  MOUTH and THROAT: Lips are without lesions. Oral mucosa is moist and without lesions.  RESPIRATORY: Breathing is even & unlabored, BS CTAB, decreased, on O2 @ 2L/min via [place] CARDIAC: RRR, no murmur,no extra heart sounds, BLE 2+ edema GI: Abdomen soft, normal BS, no masses, no tenderness EXTREMITIES:  Able to move X 4 extremities NEUROLOGICAL: There is no tremor. Speech is clear. Alert and oriented X 3. PSYCHIATRIC:  Affect and behavior are appropriate  Labs reviewed: Recent Labs    03/31/21 0639 04/01/21 0041 04/02/21 0056  NA 141 140 139  K 3.6 4.8 4.1  CL 104 103 99  CO2 29 28 32  GLUCOSE 119* 139* 157*  BUN 9 7* 7*  CREATININE 0.88 0.67 0.79  CALCIUM 7.8* 8.1* 8.2*  MG 1.8 1.9 1.9  PHOS  --  1.9* 2.9   Recent Labs    01/19/21 0211 01/24/21 1156 03/30/21 1200 04/01/21 0041 04/02/21 0056  AST 14* 23 20  --   --   ALT 23 41* 22  --   --   ALKPHOS 60 72 74  --   --   BILITOT 0.6 0.4 0.8  --   --   PROT 4.7* 5.8* 5.4*  --   --   ALBUMIN 2.3* 3.4* 3.0* 2.5* 2.6*   Recent Labs    01/24/21 1156 01/30/21 1444 03/30/21 1200 03/30/21 1248 03/31/21 0639 04/01/21 0041 04/02/21 0056  WBC 6.9 7.1 9.5  --  7.3 7.4 5.4  NEUTROABS 5.3 4.8 5.4  --   --   --   --   HGB 11.8* 12.3 12.3   < > 11.2* 11.1* 11.0*  HCT  37.7 39.2 40.4   < > 36.5 36.3 36.4  MCV 95.3 95.7 102.3*  --  99.7 99.7 98.4  PLT 197.0 212.0 119*  --  98* 106* 126*   < > = values in this interval not displayed.   Lab Results  Component Value Date   TSH 4.598 (H) 01/16/2021   Lab Results  Component Value Date   HGBA1C 6.5 (H) 04/02/2021   Lab Results  Component Value Date   CHOL 136 09/26/2020   HDL 40.00 09/26/2020   LDLCALC 75 09/22/2019   LDLDIRECT 59.0 09/26/2020   TRIG 283.0 (H) 09/26/2020   CHOLHDL 3 09/26/2020    Significant Diagnostic Results in last 30 days:  CT ABDOMEN PELVIS WO CONTRAST  Result Date: 03/31/2021 CLINICAL DATA:  Abdominal pain, sepsis EXAM: CT ABDOMEN AND PELVIS WITHOUT CONTRAST TECHNIQUE: Multidetector CT imaging of the abdomen and pelvis was performed following the standard protocol without IV contrast. COMPARISON:  CTA abdomen/pelvis dated 01/16/2021 FINDINGS: Lower chest: Emphysematous changes at the lung bases. Hepatobiliary: Unenhanced liver is grossly unremarkable. Layering gallbladder sludge (series 3/image 27), without associated inflammatory changes. No intrahepatic or extrahepatic duct dilatation. Pancreas: Within normal limits. Spleen: Within normal limits. Adrenals/Urinary Tract: Adrenal glands are within normal limits. Bilateral renal cysts, including a 12 mm hemorrhagic cyst in the medial left upper kidney (series 3/image 31) and simple cysts measuring up to 5.3 cm in the lateral right lower kidney (series 3/image 46). Three nonobstructing right lower pole renal calculi measuring up to 2 mm. No hydronephrosis. Bladder is notable for trace nondependent gas but is otherwise within normal limits. Stomach/Bowel: Stomach is within normal limits. No evidence of bowel obstruction. Appendix is not discretely visualized. No colonic wall thickening or inflammatory changes. Vascular/Lymphatic: No evidence of abdominal aortic aneurysm. Atherosclerotic calcifications of the abdominal aorta and branch  vessels. To lymph nodes Reproductive: Status post hysterectomy. No adnexal masses. Other: No abdominopelvic ascites. Musculoskeletal: Degenerative changes of the visualized thoracolumbar spine. Status post ORIF of the left hip. IMPRESSION: No CT findings to account for the patient's abdominal pain. Gallbladder sludge, without associated inflammatory changes. Three nonobstructing right lower pole renal calculi measure up to 2 mm. No hydronephrosis. Additional ancillary findings as above. Electronically Signed   By: Julian Hy M.D.   On: 03/31/2021 01:01   CT HEAD WO CONTRAST (5MM)  Result Date: 03/30/2021 CLINICAL DATA:  Mental status change EXAM: CT HEAD WITHOUT CONTRAST TECHNIQUE: Contiguous axial images were obtained from the base of the skull through the vertex without intravenous contrast. COMPARISON:  Head CT dated January 16, 2021 FINDINGS: Brain: Chronic white matter ischemic change. No evidence of acute infarction, hemorrhage, hydrocephalus, extra-axial collection or mass lesion/mass effect. Vascular: No hyperdense vessel or unexpected calcification. Skull: Normal. Negative for fracture  or focal lesion. Sinuses/Orbits: No acute finding. Other: None. IMPRESSION: No acute intracranial abnormality. Electronically Signed   By: Yetta Glassman M.D.   On: 03/30/2021 12:36   DG Chest Port 1 View  Result Date: 03/30/2021 CLINICAL DATA:  80 year old female with possible sepsis. EXAM: PORTABLE CHEST 1 VIEW COMPARISON:  Chest x-ray 01/16/2021. FINDINGS: Lung volumes are low. Widespread areas of interstitial prominence and diffuse peribronchial cuffing noted in the lungs bilaterally, most severe there out the mid to lower lungs. No definite confluent consolidative airspace disease. No pleural effusions. No pneumothorax. Crowding of the pulmonary vasculature. No evidence of pulmonary edema. Heart size is normal. Mediastinal contours are distorted by patient positioning. IMPRESSION: 1. The appearance the  chest is very similar to the prior chest radiograph, suggestive of interstitial lung disease. Outpatient referral to Pulmonology for further clinical evaluation is recommended. Additionally, follow-up nonemergent high-resolution chest CT is recommended in the near future to better evaluate these findings. Electronically Signed   By: Vinnie Langton M.D.   On: 03/30/2021 12:27    Assessment/Plan  1. Acute encephalopathy -  thought to be due  polypharmacy in the setting of renal failure and UTI. MS Contin IR was decreased from TID PRN to BID PRN together with  MS Contin, Gabapentin and Flexeril. -  was started on IV Ceftriaxone X 3 days then transitioned to oral Keflex X 2 more days -  resolved  2. AKI (acute kidney injury) ([place]) -  creatinine went up to 2.2, thought to be due to poor oral intake -  IVF was given -  discharged with creatinine 0.79 and GFR >60 -   resolved  3. Acute cystitis without hematuria -  Urine culture was positive for Klebsiella pneumonia -   was treated with IV Ceftriaxone X 3 days then transitioned to oral Keflex X 2 more days  4. ILD (interstitial lung disease) ([place]) -   followed up by [name] and Rob Hickman -   continue PRN albuterol nebulization -  son will bring Afflovest -   referred to palliative care  5. Rheumatoid arthritis involving both hands with positive rheumatoid factor (HCC) -  stable, followed  by [facility] -   Continue Hydroxychloroquine 200 mg daily, leflunomide 20 mg daily, prednisone 5 mg in the morning   6. 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 Acute on chronic diastolic heart failure (HCC) -  no SOB, continue O2 @ 2L/min via [employer] -   Continue Lasix 80 mg daily and metolazone 2.5 mg daily as needed for weight gain above 255 lbs  8. Chronic bilateral low back pain without sciatica -   pain is stable,  continue duloxetine 60 mg daily, morphine sulfate ER 15 mg twice daily, morphine sulfate IR 15 mg twice daily PRN and cyclobenzaprine 5 mg at bedtime PRN -  Follows up with Dr Naaman Plummer of [facility]  9. Essential hypertension -    stable, continue metoprolol succinate ER 125 mg daily and Benicar 40 mg daily     Family/ staff Communication: Discussed plan of care with resident and charge nurse.  Labs/tests ordered: For CBC, BMP and magnesium on 04/11/2025  Goals of care:   Short-term care   Durenda Age, DNP, MSN, FNP-BC [facility] [phone] (Monday-Friday 8:00 a.m. - 5:00 p.m.) [phone] (after hours)

## 2021-04-05 ENCOUNTER — Encounter: Payer: Self-pay | Admitting: Internal Medicine

## 2021-04-05 ENCOUNTER — Non-Acute Institutional Stay (SKILLED_NURSING_FACILITY): Payer: Medicare Other | Admitting: Internal Medicine

## 2021-04-05 DIAGNOSIS — E440 Moderate protein-calorie malnutrition: Secondary | ICD-10-CM | POA: Diagnosis not present

## 2021-04-05 DIAGNOSIS — E46 Unspecified protein-calorie malnutrition: Secondary | ICD-10-CM | POA: Insufficient documentation

## 2021-04-05 DIAGNOSIS — G9340 Encephalopathy, unspecified: Secondary | ICD-10-CM

## 2021-04-05 DIAGNOSIS — N3000 Acute cystitis without hematuria: Secondary | ICD-10-CM | POA: Diagnosis not present

## 2021-04-05 DIAGNOSIS — N189 Chronic kidney disease, unspecified: Secondary | ICD-10-CM | POA: Diagnosis not present

## 2021-04-05 DIAGNOSIS — N179 Acute kidney failure, unspecified: Secondary | ICD-10-CM | POA: Diagnosis not present

## 2021-04-05 DIAGNOSIS — E1140 Type 2 diabetes mellitus with diabetic neuropathy, unspecified: Secondary | ICD-10-CM | POA: Diagnosis not present

## 2021-04-05 NOTE — Assessment & Plan Note (Signed)
Current A1c is 6.5% which will be an average glucose of approximately 140.  No change indicated in present therapy.

## 2021-04-05 NOTE — Assessment & Plan Note (Signed)
No GU symptoms present at this time.  Patient afebrile.

## 2021-04-05 NOTE — Assessment & Plan Note (Signed)
With fluid resuscitation creatinine 0.79 and GFR greater than 60 indicating CKD stage II at discharge

## 2021-04-05 NOTE — Assessment & Plan Note (Signed)
Current albumin 2.6 and total protein 5.4.  Nutrition consult at SNF.

## 2021-04-05 NOTE — Patient Instructions (Signed)
See assessment and plan under each diagnosis in the problem list and acutely for this visit

## 2021-04-05 NOTE — Progress Notes (Signed)
NURSING HOME LOCATION:  [facility] ROOM NUMBER:  [id number]  CODE STATUS:  DNR/DNI  PCP:  Grier Mitts MD  This is a comprehensive admission note to this SNFperformed on this date less than 30 days from date of admission. Included are preadmission medical/surgical history; reconciled medication list; family history; social history and comprehensive review of systems.  Corrections and additions to the records were documented. Comprehensive physical exam was also performed. Additionally a clinical summary was entered for each active diagnosis pertinent to this admission in the Problem List to enhance continuity of care.  HPI: She was hospitalized 11/12 - 04/03/2021 , brought to the ED by EMS because of increasing confusion and poor oral intake for at least 1 day in the context of dosage increase of her chronic pain medicine.  Additional symptoms included abdominal pain, loose stools, and decreased urine output for 3-4 days PTA.  In the ED temp was 100.5.  AKI was present with a creatinine of 2.2 and GFR of 22 indicating stage IV CKD.  Chest x-ray revealed stable ILD changes.  CT revealed no acute findings. Urinalysis revealed large leukocytes, small hemoglobin and rare bacteria.  Venous blood gases did reveal hypercarbia.  IV fluid resuscitation and IV ceftriaxone were initiated. AKI & encephalopathy resolved with fluid resuscitation.  Urine culture revealed Klebsiella pneumonia.  After 3 days of IV ceftriaxone she was transitioned to oral Keflex for 2 additional days. With IV resuscitation she did have some chest tightness, dyspnea, and associated edema.  Diuretics had been held in the setting of AKI but she received IV Lasix 60 mg x 1 with resolution of symptoms.  Maintenance home Lasix was reinitiated. She did have mild anemia while hospitalized.  Initial H/H was 12.3/40.4 with an MCV of 102.3.  Platelet count was 119,000.  At discharge H/H 11/36.4 with a platelet count of  126,000.  Protein caloric malnutrition was suggested by albumin of 2.6 and total protein of 5.4. Because of debilitation, PT/OT recommended discharge to SNF for rehab.  Past medical and surgical history is long and complex.  Diagnoses include history of chronic diastolic congestive heart failure, rheumatoid arthritis, history of gout, peripheral arterial disease, diabetes with CKD and peripheral neuropathy, COPD, anxiety/depression history, dyslipidemia, essential hypertension, hypothyroidism, morbid obesity, rheumatoid arthritis, lumbar spinal stenosis, and interstitial lung disease. Surgeries and procedures include hysterectomy, EGD, video bronchoscopy, and cardiac cath.  Social history: Nondrinker; non-smoker.  Family history: Noncontributory due to advanced age.  There is a strong family history of diabetes.   Review of systems: Today she is able to give an excellent history.  She stated that she been having increased pain in the left hip and lateral thigh down to the knee in the context of her lumbar spinal stenosis.  The pain had resulted in increased blood pressures necessitating increase in antihypertensive dosages.  The pain clinic prescribed 1 extra "breakthrough pill" which she states initiated the mental status changes.  She states that she could "barely talk" and had associated anorexia.  She validates that she did have a UTI diagnosed during hospitalization. She states she is on intermittent O2 at 5 L at night and with exertion.  She states that she has had an obstructive sleep apnea work-up which was negative. She is aware of the protein deficits and supplement had been recommended. With her collagen vascular disease she describes dry mouth and dry eyes. She has numbness of the right hand which has progressed over the last year. She  stated that in the hospital it was noted that there was some "redness" of the left heel was red and @ the left buttock.  Wound c Care Nurse was following  her.  Constitutional: No fever, significant weight change  Eyes: No redness, discharge, pain, vision change ENT/mouth: No nasal congestion, purulent discharge, earache, change in hearing, sore throat  Cardiovascular: No chest pain, palpitations, paroxysmal nocturnal dyspnea, claudication, edema  Respiratory: No cough, sputum production, hemoptysis Gastrointestinal: No heartburn, dysphagia, abdominal pain, nausea /vomiting, rectal bleeding, melena, change in bowels Genitourinary: No dysuria, hematuria, pyuria, incontinence, nocturia Dermatologic: No rash, pruritus Neurologic: No dizziness, headache, syncope, seizures Psychiatric: No significant anxiety, depression, insomnia, anorexia Endocrine: No change in hair/nails, excessive thirst, excessive hunger, excessive urination  Hematologic/lymphatic: No significant bruising, lymphadenopathy, abnormal bleeding Allergy/immunology: No itchy/watery eyes, significant sneezing, urticaria, angioedema  Physical exam:  Pertinent or positive findings: Morbid obesity is present.  She has bilateral ptosis and arcus senilis.  Tongue is dry.  She has an upper plate.  Slight tachycardia is noted with increase in first and second heart sounds.  There is accentuation of the superior posterior thoracic subcutaneous tissue.  She has coarse bibasilar rales.  1/2+ edema is present @ the right ankle with trace on the left.  Posterior tibial pulses are stronger than dorsalis pedis pulses.  Upper extremities are stronger than lower extremities.  She has a partial flexion contracture of the right hand.  There is hyperpigmentation of the lateral malar areas.  Keratotic lesion is present over the left temple.  She has hyperpigmentation over the left shin and lateral calf.  She states that the lateral calf area is residual change from prior wound which has healed.  General appearance:  no acute distress, increased work of breathing is present.   Lymphatic: No lymphadenopathy  about the head, neck, axilla. Eyes: No conjunctival inflammation or lid edema is present. There is no scleral icterus. Ears:  External ear exam shows no significant lesions or deformities.   Nose:  External nasal examination shows no deformity or inflammation. Nasal mucosa are pink and moist without lesions, exudates Oral exam: Lips and gums are healthy appearing.There is no oropharyngeal erythema or exudate. Neck:  No thyromegaly, masses, tenderness noted.    Heart:  No murmur, click, rub.  Lungs:  without wheezes, rhonchi, rubs. Abdomen: Bowel sounds are normal.  Abdomen is soft and nontender with no organomegaly, hernias, masses. GU: Deferred  Extremities:  No cyanosis, clubbing. Neurologic exam:  Balance, Rhomberg, finger to nose testing could not be completed due to clinical state Skin: Warm & dry w/o tenting. No significant rash.  See clinical summary under each active problem in the Problem List with associated updated therapeutic plan

## 2021-04-06 NOTE — Assessment & Plan Note (Signed)
Oriented X 3 Excellent historian .

## 2021-04-10 ENCOUNTER — Encounter: Payer: Self-pay | Admitting: Adult Health

## 2021-04-10 ENCOUNTER — Non-Acute Institutional Stay (SKILLED_NURSING_FACILITY): Payer: Medicare Other | Admitting: Adult Health

## 2021-04-10 DIAGNOSIS — M05741 Rheumatoid arthritis with rheumatoid factor of right hand without organ or systems involvement: Secondary | ICD-10-CM

## 2021-04-10 DIAGNOSIS — E1140 Type 2 diabetes mellitus with diabetic neuropathy, unspecified: Secondary | ICD-10-CM

## 2021-04-10 DIAGNOSIS — I5032 Chronic diastolic (congestive) heart failure: Secondary | ICD-10-CM

## 2021-04-10 DIAGNOSIS — M05742 Rheumatoid arthritis with rheumatoid factor of left hand without organ or systems involvement: Secondary | ICD-10-CM | POA: Diagnosis not present

## 2021-04-10 DIAGNOSIS — I10 Essential (primary) hypertension: Secondary | ICD-10-CM | POA: Diagnosis not present

## 2021-04-10 NOTE — Progress Notes (Signed)
Location:  [facility] Room Number: 216 Place of Service:  SNF (31) Provider:  Durenda Age, DNP, FNP-BC  Patient Care Team: [name], MD as PCP - General (Family Medicine) Lorretta Harp, MD as PCP - Cardiology (Cardiology) Duffy, Creola Corn, LCSW as Social Worker (Licensed Clinical Social Worker) Kidney, Nelida Meuse, [place], MD as Consulting Physician (Rheumatology) Efraim Kaufmann, NP (Rheumatology) Theodosia Blender, MD as Referring Physician (Pulmonary Disease)  Extended Emergency Contact Information Primary Emergency Contact: Farrow,Curtis Address: [address]          [place], [place] [address] [place] of [place] Phone: [phone] Mobile Phone: [phone] Relation: Son Secondary Emergency Contact: Fonnie Jarvis Address: [address], [place] [address] [place] Phone: [phone] Mobile Phone: [phone] Relation: Daughter  Code Status:  DNR  Goals of care: Advanced Directive information Advanced Directives 04/10/2021  Does Patient Have a Medical Advance Directive? Yes  Type of Advance Directive Out of facility DNR (pink MOST or yellow form)  Does patient want to make changes to medical advance directive? -  Copy of [place] in Chart? -  Would patient like information on creating a medical advance directive? -  Pre-existing out of facility DNR order (yellow form or pink MOST form) Pink MOST form placed in chart (order not valid for inpatient use)     Chief Complaint  Patient presents with   Acute Visit    Short term rehabilitation follow up    HPI:  Pt is a 80 y.o. female seen today for an acute visit. She is a short-term care resident of [facility]. She takes Ozempic 0.25 0.5 mg/dose pen inject 25 mg once a week for diabetes mellitus.  Latest A1c 6.5, 04/02/2021.  She takes furosemide 80 mg daily for CHF.  No SOB. SBPs ranging  121-135, outlier 104. She takes Benicar 40 mg daily and metoprolol succinate ER 25 mg give 5 tabs = 125 mg daily for hypertension.  She takes leflunomide 20 mg daily, hydroxychloroquine 200 mg 1 tab daily and prednisone 5 mg daily for rheumatoid arthritis.  She is currently having PT and OT.   Past Medical History:  Diagnosis Date   Acute on chronic diastolic (congestive) heart failure (HCC)    Anemia    iron deficiency anemia - secondary to blood loss ( chronic)    Anxiety    Arthritis    endstage changes bilateral knees/bilateral ankles.    Asthma    Carotid artery occlusion    Chronic fatigue    Chronic kidney disease    Closed left hip fracture (HCC)    Clotting disorder ([place])    pt denies this   Contusion of left knee    due to fall 1/14.   COPD (chronic obstructive pulmonary disease) (HCC)    pulmonary fibrosis   Depression, reactive    Diabetes mellitus    type II    Diastolic dysfunction    Difficulty in walking    Family history of heart disease    Generalized muscle weakness    Gout    High cholesterol    History of falling    Hypertension    Hypothyroidism    Interstitial lung disease ([place])    Meningioma of left sphenoid wing involving cavernous sinus ([place]) 02/17/2012   Continue diplopia, left eye pain and left headaches.     Morbid obesity ([place])  Neuromuscular disorder ([place])    diabetic neuropathy    Normal coronary arteries    cardiac catheterization performed  10/31/14   Pneumonia    RA (rheumatoid arthritis) ([place])    has been off methotreaxte since 10/13.   Spinal stenosis of lumbar region    Thyroid disease    Unspecified lack of coordination    URI (upper respiratory infection)    Past Surgical History:  Procedure Laterality Date   ABDOMINAL HYSTERECTOMY     BRAIN SURGERY     Gamma knife 10/13. Needs repeat spring  '14   CARDIAC CATHETERIZATION N/A 10/31/2014   Procedure: Right/Left Heart Cath and Coronary Angiography;  Surgeon: [name], MD;  Location: [facility];  Service: Cardiovascular;  Laterality: N/A;   ESOPHAGOGASTRODUODENOSCOPY (EGD) WITH PROPOFOL N/A 09/14/2014   Procedure: ESOPHAGOGASTRODUODENOSCOPY (EGD) WITH PROPOFOL;  Surgeon: [name], MD;  Location: [facility];  Service: Endoscopy;  Laterality: N/A;   INCISION AND DRAINAGE HIP Left 01/16/2017   Procedure: IRRIGATION AND DEBRIDEMENT LEFT HIP;  Surgeon: [name], MD;  Location: [facility];  Service: Orthopedics;  Laterality: Left;   INTRAMEDULLARY (IM) NAIL INTERTROCHANTERIC Left 11/29/2016   Procedure: INTRAMEDULLARY (IM) NAIL INTERTROCHANTRIC;  Surgeon: [name], MD;  Location: [facility];  Service: Orthopedics;  Laterality: Left;   OVARY SURGERY     SHOULDER SURGERY Left    TONSILLECTOMY  age 31   VIDEO BRONCHOSCOPY Bilateral 05/31/2013   Procedure: VIDEO BRONCHOSCOPY WITHOUT FLUORO;  Surgeon: Brand Males, MD;  Location: [facility];  Service: Cardiopulmonary;  Laterality: Bilateral;   video bronscoscopy  2000   lung    Allergies  Allergen Reactions   Codeine Swelling and Other (See Comments)    Facial swelling Chest pain and swelling in legs  Facial swelling   Infliximab Anaphylaxis    "sent me into shock" "sent me into shock"   Lisinopril Swelling    Face and neck swelling Face and neck swelling    Outpatient Encounter Medications as of 04/10/2021  Medication Sig   acetaminophen (TYLENOL) 500 MG tablet Take 1,000 mg by mouth every 6 (six) hours as needed for moderate pain.    albuterol (PROVENTIL HFA;VENTOLIN HFA) 108 (90 BASE) MCG/ACT inhaler Inhale 2 puffs into the lungs every 6 (six) hours as needed for wheezing or shortness of breath.   allopurinol (ZYLOPRIM) 300 MG tablet Take 300 mg by mouth daily.   Artificial Tear Ointment (DRY EYES OP) Apply 1 drop to eye daily as needed (dry eyes).   atorvastatin (LIPITOR) 20 MG tablet Take 0.5 tablets (10 mg total) by mouth every evening.   Calcium  Carb-Cholecalciferol (CALCIUM + D3 PO) Take 2 tablets by mouth daily with lunch.    cholecalciferol (VITAMIN D) 1000 UNITS tablet Take 1,000 Units by mouth daily with lunch.   colchicine 0.6 MG tablet Take 0.6 mg by mouth daily as needed (gout flares).   cyclobenzaprine (FLEXERIL) 5 MG tablet Take 1 tablet (5 mg total) by mouth at bedtime as needed for muscle spasms.   diclofenac sodium (VOLTAREN) 1 % GEL Apply 2 g topically 3 (three) times daily. Both hands and knees   DULoxetine (CYMBALTA) 60 MG capsule Take 1 capsule (60 mg total) by mouth daily.   fluticasone (FLONASE) 50 MCG/ACT nasal spray Place 2 sprays into both nostrils daily.   furosemide (LASIX) 80 MG tablet Take 1 tablet (80 mg total) by mouth daily.   gabapentin (NEURONTIN) 600 MG tablet Take 1  tablet (600 mg total) by mouth 3 (three) times daily.   guaiFENesin (MUCINEX) 600 MG 12 hr tablet Take 1,200 mg by mouth 2 (two) times daily.   hydroxychloroquine (PLAQUENIL) 200 MG tablet Take 200 mg by mouth daily.   leflunomide (ARAVA) 20 MG tablet Take 1 tablet (20 mg total) by mouth daily.   levothyroxine (SYNTHROID) 50 MCG tablet Take 1 tablet (50 mcg total) by mouth daily.   linaclotide (LINZESS) 72 MCG capsule Take 1 capsule (72 mcg total) by mouth daily before breakfast.   metolazone (ZAROXOLYN) 2.5 MG tablet TAKE 1 TABLET BY MOUTH DAILY AS NEEDED FOR 2 DAYS FOR WEIGHT ABOVE 255 POUNDS   metoprolol succinate (TOPROL-XL) 25 MG 24 hr tablet Take 5 tablets (125 mg total) by mouth daily.   morphine (MSIR) 15 MG tablet One tablet twice a day as needed for break through pain.   Multiple Vitamin (MULTIVITAMIN WITH MINERALS) TABS Take 1 tablet by mouth daily.    olmesartan (BENICAR) 40 MG tablet Take 1 tablet (40 mg total) by mouth daily.   OXYGEN-HELIUM IN Inhale 2 L into the lungs at bedtime. And on exertion   pantoprazole (PROTONIX) 40 MG tablet take 1 tablet by mouth once daily MINUTES BEFORE 1ST MEAL OF THE DAY   Potassium Chloride ER  20 MEQ TBCR Take 20 mEq by mouth daily.   predniSONE (DELTASONE) 5 MG tablet Take 10 mg by mouth every morning.    Semaglutide,0.25 or 0.5MG/DOS, (OZEMPIC, 0.25 OR 0.5 MG/DOSE,) 2 MG/1.5ML SOPN Inject 0.25 mg as directed once a week.   sodium chloride (OCEAN) 0.65 % SOLN nasal spray Place 1 spray into both nostrils as needed for congestion.   [DISCONTINUED] morphine (MS CONTIN) 15 MG 12 hr tablet Take 1 tablet (15 mg total) by mouth every 12 (twelve) hours.   [DISCONTINUED] aspirin buffered (BUFFERIN) 325 MG TABS tablet Take 325 mg by mouth daily.   [DISCONTINUED] Blood Pressure Monitoring (BLOOD PRESSURE MONITOR/ARM) DEVI To check blood pressure up to 3 times daily.   [DISCONTINUED] ipratropium-albuterol (DUONEB) 0.5-2.5 (3) MG/3ML SOLN Take 3 mLs by nebulization every 6 (six) hours as needed (cough/wheezing).    [DISCONTINUED] Spacer/Aero-Holding Chambers (AEROCHAMBER MV) inhaler Use as instructed   No facility-administered encounter medications on file as of 04/10/2021.    Review of Systems  GENERAL: No change in appetite, no fatigue, no weight changes, no fever, chills or weakness MOUTH and THROAT: Denies oral discomfort, gingival pain or bleeding RESPIRATORY: no cough, SOB, DOE, wheezing, hemoptysis CARDIAC: No chest pain, edema or palpitations GI: No abdominal pain, diarrhea, constipation, heart burn, nausea or vomiting GU: Denies dysuria, frequency, hematuria, incontinence, or discharge NEUROLOGICAL: Denies dizziness, syncope, numbness, or headache PSYCHIATRIC: Denies feelings of depression or anxiety. No report of hallucinations, insomnia, paranoia, or agitation   Immunization History  Administered Date(s) Administered   Fluad Quad(high Dose 65+) 02/02/2019, 03/11/2021   Influenza Split 01/29/2012, 03/30/2013, 02/10/2014   Influenza, High Dose Seasonal PF 02/16/2017, 02/11/2018, 01/31/2019, 02/27/2020   Influenza,inj,Quad PF,6+ Mos 02/09/2015, 12/18/2015   Influenza-Unspecified  02/09/2015, 12/18/2015   PFIZER Comirnaty(Gray Top)Covid-19 Tri-Sucrose Vaccine 07/24/2019, 08/14/2019, 02/27/2020   PFIZER(Purple Top)SARS-COV-2 Vaccination 07/17/2019, 08/14/2019   PPD Test 04/23/2015   Pneumococcal Conjugate-13 08/21/2014   Pneumococcal Polysaccharide-23 02/17/2012   Tdap 12/27/2015   Pertinent  Health Maintenance Due  Topic Date Due   FOOT EXAM  05/23/2020   OPHTHALMOLOGY EXAM  03/15/2021   HEMOGLOBIN A1C  09/30/2021   INFLUENZA VACCINE  Completed   DEXA SCAN  Completed   Fall Risk 04/01/2021 04/01/2021 04/02/2021 04/03/2021 04/03/2021  Falls in the past year? - - - - -  Number of falls in past year - - - - -  Was there an injury with Fall? - - - - -  Was there an injury with Fall? - - - - -  Fall Risk Category Calculator - - - - -  Fall Risk Category - - - - -  Patient Fall Risk Level _0   Patient at Risk for Falls Due to - - - - -  Patient at Risk for Falls Due to - - - - -  Fall risk Follow up - - - - -  Fall risk Follow up - - - - -     Vitals:   04/10/21 1017  BP: 127/64  Pulse: 67  Resp: 17  Temp: (!) 97.5 F (36.4 C)  Weight: 221 lb 4.8 oz (100.4 kg)  Height: _1  (1.626 m)   Body mass index is 37.99 kg/m.  Physical Exam  GENERAL APPEARANCE: Well nourished. In no acute distress.  Obese  SKIN:  Skin is warm and dry.  MOUTH and THROAT: Lips are without lesions. Oral mucosa is moist and without lesions. Tongue is normal in shape, size, and color and without lesions RESPIRATORY: Breathing is even & unlabored, BS CTAB CARDIAC: RRR, no murmur,no extra heart sounds, BLE 2+edema GI: Abdomen soft, normal BS, no masses, no tenderness EXTREMITIES:  Able to move X 4 extremities NEUROLOGICAL: There is no tremor. Speech is clear. Alert and oriented X 3. PSYCHIATRIC:  Affect and behavior are appropriate  Labs reviewed: Recent Labs    03/31/21 0639 04/01/21 0041  04/02/21 0056  NA 141 140 139  K 3.6 4.8 4.1  CL 104 103 99  CO2 29 28 32  GLUCOSE 119* 139* 157*  BUN 9 7* 7*  CREATININE 0.88 0.67 0.79  CALCIUM 7.8* 8.1* 8.2*  MG 1.8 1.9 1.9  PHOS  --  1.9* 2.9   Recent Labs    01/19/21 0211 01/24/21 1156 03/30/21 1200 04/01/21 0041 04/02/21 0056  AST 14* 23 20  --   --   ALT 23 41* 22  --   --   ALKPHOS 60 72 74  --   --   BILITOT 0.6 0.4 0.8  --   --   PROT 4.7* 5.8* 5.4*  --   --   ALBUMIN 2.3* 3.4* 3.0* 2.5* 2.6*   Recent Labs    01/24/21 1156 01/30/21 1444 03/30/21 1200 03/30/21 1248 03/31/21 0639 04/01/21 0041 04/02/21 0056  WBC 6.9 7.1 9.5  --  7.3 7.4 5.4  NEUTROABS 5.3 4.8 5.4  --   --   --   --   HGB 11.8* 12.3 12.3   < > 11.2* 11.1* 11.0*  HCT 37.7 39.2 40.4   < > 36.5 36.3 36.4  MCV 95.3 95.7 102.3*  --  99.7 99.7 98.4  PLT 197.0 212.0 119*  --  98* 106* 126*   < > = values in this interval not displayed.   Lab Results  Component Value Date   TSH 4.598 (H) 01/16/2021   Lab Results  Component Value Date   HGBA1C 6.5 (H) 04/02/2021   Lab Results  Component Value Date   CHOL 136 09/26/2020   HDL 40.00 09/26/2020   LDLCALC 75 09/22/2019   LDLDIRECT 59.0 09/26/2020  TRIG 283.0 (H) 09/26/2020   CHOLHDL 3 09/26/2020    Significant Diagnostic Results in last 30 days:  CT ABDOMEN PELVIS WO CONTRAST  Result Date: 03/31/2021 CLINICAL DATA:  Abdominal pain, sepsis EXAM: CT ABDOMEN AND PELVIS WITHOUT CONTRAST TECHNIQUE: Multidetector CT imaging of the abdomen and pelvis was performed following the standard protocol without IV contrast. COMPARISON:  CTA abdomen/pelvis dated 01/16/2021 FINDINGS: Lower chest: Emphysematous changes at the lung bases. Hepatobiliary: Unenhanced liver is grossly unremarkable. Layering gallbladder sludge (series 3/image 27), without associated inflammatory changes. No intrahepatic or extrahepatic duct dilatation. Pancreas: Within normal limits. Spleen: Within normal limits.  Adrenals/Urinary Tract: Adrenal glands are within normal limits. Bilateral renal cysts, including a 12 mm hemorrhagic cyst in the medial left upper kidney (series 3/image 31) and simple cysts measuring up to 5.3 cm in the lateral right lower kidney (series 3/image 46). Three nonobstructing right lower pole renal calculi measuring up to 2 mm. No hydronephrosis. Bladder is notable for trace nondependent gas but is otherwise within normal limits. Stomach/Bowel: Stomach is within normal limits. No evidence of bowel obstruction. Appendix is not discretely visualized. No colonic wall thickening or inflammatory changes. Vascular/Lymphatic: No evidence of abdominal aortic aneurysm. Atherosclerotic calcifications of the abdominal aorta and branch vessels. To lymph nodes Reproductive: Status post hysterectomy. No adnexal masses. Other: No abdominopelvic ascites. Musculoskeletal: Degenerative changes of the visualized thoracolumbar spine. Status post ORIF of the left hip. IMPRESSION: No CT findings to account for the patient's abdominal pain. Gallbladder sludge, without associated inflammatory changes. Three nonobstructing right lower pole renal calculi measure up to 2 mm. No hydronephrosis. Additional ancillary findings as above. Electronically Signed   By: Julian Hy M.D.   On: 03/31/2021 01:01   CT HEAD WO CONTRAST (5MM)  Result Date: 03/30/2021 CLINICAL DATA:  Mental status change EXAM: CT HEAD WITHOUT CONTRAST TECHNIQUE: Contiguous axial images were obtained from the base of the skull through the vertex without intravenous contrast. COMPARISON:  Head CT dated January 16, 2021 FINDINGS: Brain: Chronic white matter ischemic change. No evidence of acute infarction, hemorrhage, hydrocephalus, extra-axial collection or mass lesion/mass effect. Vascular: No hyperdense vessel or unexpected calcification. Skull: Normal. Negative for fracture or focal lesion. Sinuses/Orbits: No acute finding. Other: None. IMPRESSION: No  acute intracranial abnormality. Electronically Signed   By: Yetta Glassman M.D.   On: 03/30/2021 12:36   DG Chest Port 1 View  Result Date: 03/30/2021 CLINICAL DATA:  80 year old female with possible sepsis. EXAM: PORTABLE CHEST 1 VIEW COMPARISON:  Chest x-ray 01/16/2021. FINDINGS: Lung volumes are low. Widespread areas of interstitial prominence and diffuse peribronchial cuffing noted in the lungs bilaterally, most severe there out the mid to lower lungs. No definite confluent consolidative airspace disease. No pleural effusions. No pneumothorax. Crowding of the pulmonary vasculature. No evidence of pulmonary edema. Heart size is normal. Mediastinal contours are distorted by patient positioning. IMPRESSION: 1. The appearance the chest is very similar to the prior chest radiograph, suggestive of interstitial lung disease. Outpatient referral to Pulmonology for further clinical evaluation is recommended. Additionally, follow-up nonemergent high-resolution chest CT is recommended in the near future to better evaluate these findings. Electronically Signed   By: Vinnie Langton M.D.   On: 03/30/2021 12:27    Assessment/Plan  1. 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) Lab Results  Component Value Date   HGBA1C 6.5 (H) 04/02/2021   -   Stable, continue Ozempic  2. Rheumatoid arthritis involving both hands with positive rheumatoid factor ([place]) -  Stable, continue hydroxychloroquine, leflunomide and prednisone  3. Essential hypertension -  BPs stable, continue Benicar and metoprolol succinate ER  4. Chronic diastolic CHF (congestive heart failure) (HCC) -  no SOB, stable -    Continue furosemide    Family/ staff Communication: Discussed plan of care with resident and charge nurse.  Labs/tests ordered:  None  Goals of care:   Short-term care   Durenda Age, DNP, MSN, FNP-BC [facility] [phone] (Monday-Friday 8:00 a.m. - 5:00  p.m.) [phone] (after hours)

## 2021-04-17 ENCOUNTER — Other Ambulatory Visit: Payer: Self-pay | Admitting: Adult Health

## 2021-04-17 ENCOUNTER — Other Ambulatory Visit: Payer: Self-pay | Admitting: Family Medicine

## 2021-04-17 DIAGNOSIS — E2839 Other primary ovarian failure: Secondary | ICD-10-CM

## 2021-04-17 MED ORDER — MORPHINE SULFATE ER 15 MG PO TBCR: 15.0000 mg | EXTENDED_RELEASE_TABLET | Freq: Two times a day (BID) | ORAL | 0 refills | Status: DC

## 2021-04-18 DIAGNOSIS — E1140 Type 2 diabetes mellitus with diabetic neuropathy, unspecified: Secondary | ICD-10-CM | POA: Diagnosis not present

## 2021-04-18 DIAGNOSIS — G9340 Encephalopathy, unspecified: Secondary | ICD-10-CM | POA: Diagnosis not present

## 2021-04-18 DIAGNOSIS — N3000 Acute cystitis without hematuria: Secondary | ICD-10-CM | POA: Diagnosis not present

## 2021-04-18 DIAGNOSIS — E782 Mixed hyperlipidemia: Secondary | ICD-10-CM | POA: Diagnosis not present

## 2021-04-18 DIAGNOSIS — M6281 Muscle weakness (generalized): Secondary | ICD-10-CM | POA: Diagnosis not present

## 2021-04-18 DIAGNOSIS — R2681 Unsteadiness on feet: Secondary | ICD-10-CM | POA: Diagnosis not present

## 2021-04-18 DIAGNOSIS — M545 Low back pain, unspecified: Secondary | ICD-10-CM | POA: Diagnosis not present

## 2021-04-18 DIAGNOSIS — J8410 Pulmonary fibrosis, unspecified: Secondary | ICD-10-CM | POA: Diagnosis not present

## 2021-04-18 DIAGNOSIS — U071 COVID-19: Secondary | ICD-10-CM | POA: Diagnosis not present

## 2021-04-18 DIAGNOSIS — I10 Essential (primary) hypertension: Secondary | ICD-10-CM | POA: Diagnosis not present

## 2021-04-18 DIAGNOSIS — R1312 Dysphagia, oropharyngeal phase: Secondary | ICD-10-CM | POA: Diagnosis not present

## 2021-04-18 DIAGNOSIS — Z741 Need for assistance with personal care: Secondary | ICD-10-CM | POA: Diagnosis not present

## 2021-04-18 DIAGNOSIS — M069 Rheumatoid arthritis, unspecified: Secondary | ICD-10-CM | POA: Diagnosis not present

## 2021-04-18 DIAGNOSIS — M1A09X0 Idiopathic chronic gout, multiple sites, without tophus (tophi): Secondary | ICD-10-CM | POA: Diagnosis not present

## 2021-04-18 DIAGNOSIS — J9611 Chronic respiratory failure with hypoxia: Secondary | ICD-10-CM | POA: Diagnosis not present

## 2021-04-18 DIAGNOSIS — N179 Acute kidney failure, unspecified: Secondary | ICD-10-CM | POA: Diagnosis not present

## 2021-04-18 DIAGNOSIS — J849 Interstitial pulmonary disease, unspecified: Secondary | ICD-10-CM | POA: Diagnosis not present

## 2021-04-18 DIAGNOSIS — M05741 Rheumatoid arthritis with rheumatoid factor of right hand without organ or systems involvement: Secondary | ICD-10-CM | POA: Diagnosis not present

## 2021-04-18 DIAGNOSIS — E039 Hypothyroidism, unspecified: Secondary | ICD-10-CM | POA: Diagnosis not present

## 2021-04-18 DIAGNOSIS — I5032 Chronic diastolic (congestive) heart failure: Secondary | ICD-10-CM | POA: Diagnosis not present

## 2021-04-19 ENCOUNTER — Encounter: Payer: Self-pay | Admitting: Adult Health

## 2021-04-19 ENCOUNTER — Non-Acute Institutional Stay (SKILLED_NURSING_FACILITY): Payer: Medicare Other | Admitting: Adult Health

## 2021-04-19 DIAGNOSIS — E1140 Type 2 diabetes mellitus with diabetic neuropathy, unspecified: Secondary | ICD-10-CM | POA: Diagnosis not present

## 2021-04-19 DIAGNOSIS — I5032 Chronic diastolic (congestive) heart failure: Secondary | ICD-10-CM

## 2021-04-19 NOTE — Progress Notes (Signed)
Location:  [facility] Room Number: [place] of Service:  SNF (31) Provider:  Durenda Age, DNP, FNP-BC  Patient Care Team: [name], MD as PCP - General (Family Medicine) Lorretta Harp, MD as PCP - Cardiology (Cardiology) Duffy, Creola Corn, LCSW as Social Worker (Licensed Clinical Social Worker) Kidney, Nelida Meuse, [place], MD as Consulting Physician (Rheumatology) Efraim Kaufmann, NP (Rheumatology) Theodosia Blender, MD as Referring Physician (Pulmonary Disease)  Extended Emergency Contact Information Primary Emergency Contact: Farrow,Curtis Address: [address]          [place], [place] [address] Johnnette Litter of [place] Phone: [phone] Mobile Phone: [phone] Relation: Son Secondary Emergency Contact: Fonnie Jarvis Address: [address], [place] [address] [place] Phone: [phone] Mobile Phone: [phone] Relation: Daughter  Code Status:  DNR  Goals of care: Advanced Directive information Advanced Directives 04/10/2021  Does Patient Have a Medical Advance Directive? Yes  Type of Advance Directive Out of facility DNR (pink MOST or yellow form)  Does patient want to make changes to medical advance directive? -  Copy of [place] in Chart? -  Would patient like information on creating a medical advance directive? -  Pre-existing out of facility DNR order (yellow form or pink MOST form) Pink MOST form placed in chart (order not valid for inpatient use)     Chief Complaint  Patient presents with   Acute Visit    Short-term rehabilitation visit    HPI:  Pt is a 80 y.o. female seen today for an acute visit. She is a short-term care resident of [facility]. Latest weight is  229.8 lbs, gained 9.2 lbs since admission. She takes Furosemide 80 mg daily and Metolazone 2.5 mg daily as needed for weight gain  above  255 lbs. She complaints of  having tightness on BLE. She has  BLE 2+ edema. No SOB. Latest A1C 6.5, 04/02/21. She takes Ozempic 0.25-0.5 mg/dose inject 25 units weekly for diabetes mellitus.     Past Medical History:  Diagnosis Date   Acute on chronic diastolic (congestive) heart failure (HCC)    Anemia    iron deficiency anemia - secondary to blood loss ( chronic)    Anxiety    Arthritis    endstage changes bilateral knees/bilateral ankles.    Asthma    Carotid artery occlusion    Chronic fatigue    Chronic kidney disease    Closed left hip fracture (HCC)    Clotting disorder ([place])    pt denies this   Contusion of left knee    due to fall 1/14.   COPD (chronic obstructive pulmonary disease) (HCC)    pulmonary fibrosis   Depression, reactive    Diabetes mellitus    type II    Diastolic dysfunction    Difficulty in walking    Family history of heart disease    Generalized muscle weakness    Gout    High cholesterol    History of falling    Hypertension    Hypothyroidism    Interstitial lung disease ([place])    Meningioma of left sphenoid wing involving cavernous sinus ([place]) 02/17/2012   Continue diplopia, left eye pain and left headaches.     Morbid obesity ([place])    Neuromuscular disorder ([place])    diabetic neuropathy    Normal coronary arteries    cardiac catheterization performed  10/31/14  Pneumonia    RA (rheumatoid arthritis) ([place])    has been off methotreaxte since 10/13.   Spinal stenosis of lumbar region    Thyroid disease    Unspecified lack of coordination    URI (upper respiratory infection)    Past Surgical History:  Procedure Laterality Date   ABDOMINAL HYSTERECTOMY     BRAIN SURGERY     Gamma knife 10/13. Needs repeat spring  '14   CARDIAC CATHETERIZATION N/A 10/31/2014   Procedure: Right/Left Heart Cath and Coronary Angiography;  Surgeon: [name], MD;  Location: [place] [facility];  Service: Cardiovascular;  Laterality: N/A;   ESOPHAGOGASTRODUODENOSCOPY (EGD) WITH  PROPOFOL N/A 09/14/2014   Procedure: ESOPHAGOGASTRODUODENOSCOPY (EGD) WITH PROPOFOL;  Surgeon: [name], MD;  Location: [facility];  Service: Endoscopy;  Laterality: N/A;   INCISION AND DRAINAGE HIP Left 01/16/2017   Procedure: IRRIGATION AND DEBRIDEMENT LEFT HIP;  Surgeon: [name], MD;  Location: [facility];  Service: Orthopedics;  Laterality: Left;   INTRAMEDULLARY (IM) NAIL INTERTROCHANTERIC Left 11/29/2016   Procedure: INTRAMEDULLARY (IM) NAIL INTERTROCHANTRIC;  Surgeon: [name], MD;  Location: [place] [facility];  Service: Orthopedics;  Laterality: Left;   OVARY SURGERY     SHOULDER SURGERY Left    TONSILLECTOMY  age 30   VIDEO BRONCHOSCOPY Bilateral 05/31/2013   Procedure: VIDEO BRONCHOSCOPY WITHOUT FLUORO;  Surgeon: Brand Males, MD;  Location: [facility];  Service: Cardiopulmonary;  Laterality: Bilateral;   video bronscoscopy  2000   lung    Allergies  Allergen Reactions   Codeine Swelling and Other (See Comments)    Facial swelling Chest pain and swelling in legs  Facial swelling   Infliximab Anaphylaxis    "sent me into shock" "sent me into shock"   Lisinopril Swelling    Face and neck swelling Face and neck swelling    Outpatient Encounter Medications as of 04/19/2021  Medication Sig   acetaminophen (TYLENOL) 500 MG tablet Take 1,000 mg by mouth every 6 (six) hours as needed for moderate pain.    albuterol (PROVENTIL HFA;VENTOLIN HFA) 108 (90 BASE) MCG/ACT inhaler Inhale 2 puffs into the lungs every 6 (six) hours as needed for wheezing or shortness of breath.   allopurinol (ZYLOPRIM) 300 MG tablet Take 300 mg by mouth daily.   Artificial Tear Ointment (DRY EYES OP) Apply 1 drop to eye daily as needed (dry eyes).   atorvastatin (LIPITOR) 20 MG tablet Take 0.5 tablets (10 mg total) by mouth every evening.   Calcium Carb-Cholecalciferol (CALCIUM + D3 PO) Take 2 tablets by mouth daily with lunch.    cholecalciferol (VITAMIN D) 1000 UNITS tablet  Take 1,000 Units by mouth daily with lunch.   colchicine 0.6 MG tablet Take 0.6 mg by mouth daily as needed (gout flares).   cyclobenzaprine (FLEXERIL) 5 MG tablet Take 1 tablet (5 mg total) by mouth at bedtime as needed for muscle spasms.   diclofenac sodium (VOLTAREN) 1 % GEL Apply 2 g topically 3 (three) times daily. Both hands and knees   DULoxetine (CYMBALTA) 60 MG capsule Take 1 capsule (60 mg total) by mouth daily.   fluticasone (FLONASE) 50 MCG/ACT nasal spray Place 2 sprays into both nostrils daily.   furosemide (LASIX) 80 MG tablet Take 1 tablet (80 mg total) by mouth daily.   gabapentin (NEURONTIN) 600 MG tablet Take 1 tablet (600 mg total) by mouth 3 (three) times daily.   guaiFENesin (MUCINEX) 600 MG 12 hr tablet Take 1,200 mg by mouth  2 (two) times daily.   hydroxychloroquine (PLAQUENIL) 200 MG tablet Take 200 mg by mouth daily.   leflunomide (ARAVA) 20 MG tablet Take 1 tablet (20 mg total) by mouth daily.   levothyroxine (SYNTHROID) 50 MCG tablet Take 1 tablet (50 mcg total) by mouth daily.   linaclotide (LINZESS) 72 MCG capsule Take 1 capsule (72 mcg total) by mouth daily before breakfast.   metolazone (ZAROXOLYN) 2.5 MG tablet TAKE 1 TABLET BY MOUTH DAILY AS NEEDED FOR 2 DAYS FOR WEIGHT ABOVE 255 POUNDS   metoprolol succinate (TOPROL-XL) 25 MG 24 hr tablet Take 5 tablets (125 mg total) by mouth daily.   morphine (MS CONTIN) 15 MG 12 hr tablet Take 1 tablet (15 mg total) by mouth every 12 (twelve) hours.   morphine (MSIR) 15 MG tablet One tablet twice a day as needed for break through pain.   Multiple Vitamin (MULTIVITAMIN WITH MINERALS) TABS Take 1 tablet by mouth daily.    olmesartan (BENICAR) 40 MG tablet Take 1 tablet (40 mg total) by mouth daily.   OXYGEN-HELIUM IN Inhale 2 L into the lungs at bedtime. And on exertion   pantoprazole (PROTONIX) 40 MG tablet take 1 tablet by mouth once daily MINUTES BEFORE 1ST MEAL OF THE DAY   Potassium Chloride ER 20 MEQ TBCR Take 20 mEq by  mouth daily.   predniSONE (DELTASONE) 5 MG tablet Take 10 mg by mouth every morning.    Semaglutide,0.25 or 0.5MG/DOS, (OZEMPIC, 0.25 OR 0.5 MG/DOSE,) 2 MG/1.5ML SOPN Inject 0.25 mg as directed once a week.   sodium chloride (OCEAN) 0.65 % SOLN nasal spray Place 1 spray into both nostrils as needed for congestion.   No facility-administered encounter medications on file as of 04/19/2021.    Review of Systems  GENERAL: No change in appetite, no fatigue, no weight changes, no fever or chills  MOUTH and THROAT: Denies oral discomfort, gingival pain or bleeding RESPIRATORY: no cough, SOB, DOE, wheezing, hemoptysis CARDIAC: No chest pain or palpitations GI: No abdominal pain, diarrhea, constipation, heart burn, nausea or vomiting GU: Denies dysuria, frequency, hematuria, incontinence, or discharge NEUROLOGICAL: Denies dizziness, syncope, numbness, or headache PSYCHIATRIC: Denies feelings of depression or anxiety. No report of hallucinations, insomnia, paranoia, or agitation   Immunization History  Administered Date(s) Administered   Fluad Quad(high Dose 65+) 02/02/2019, 03/11/2021   Influenza Split 01/29/2012, 03/30/2013, 02/10/2014   Influenza, High Dose Seasonal PF 02/16/2017, 02/11/2018, 01/31/2019, 02/27/2020   Influenza,inj,Quad PF,6+ Mos 02/09/2015, 12/18/2015   Influenza-Unspecified 02/09/2015, 12/18/2015   PFIZER Comirnaty(Gray Top)Covid-19 Tri-Sucrose Vaccine 07/24/2019, 08/14/2019, 02/27/2020   PFIZER(Purple Top)SARS-COV-2 Vaccination 07/17/2019, 08/14/2019   PPD Test 04/23/2015   Pneumococcal Conjugate-13 08/21/2014   Pneumococcal Polysaccharide-23 02/17/2012   Tdap 12/27/2015   Pertinent  Health Maintenance Due  Topic Date Due   FOOT EXAM  05/23/2020   OPHTHALMOLOGY EXAM  03/15/2021   HEMOGLOBIN A1C  09/30/2021   INFLUENZA VACCINE  Completed   DEXA SCAN  Completed   Fall Risk 04/01/2021 04/01/2021 04/02/2021 04/03/2021 04/03/2021  Falls in the past year? - - - - -   Number of falls in past year - - - - -  Was there an injury with Fall? - - - - -  Was there an injury with Fall? - - - - -  Fall Risk Category Calculator - - - - -  Fall Risk Category - - - - -  Patient Fall Risk Level High fall risk High fall risk High fall risk High fall risk  High fall risk  Patient at Risk for Falls Due to - - - - -  Patient at Risk for Falls Due to - - - - -  Fall risk Follow up - - - - -  Fall risk Follow up - - - - -     Vitals:   04/19/21 1400  BP: 113/65  Pulse: 92  Resp: 18  Temp: 97.6 F (36.4 C)  Weight: 229 lb 12.8 oz (104.2 kg)  Height: _0  (1.626 m)   Body mass index is 39.45 kg/m.  Physical Exam  GENERAL APPEARANCE: Well nourished. In no acute distress. Morbidly obese. SKIN:  Skin is warm and dry.  MOUTH and THROAT: Lips are without lesions. Oral mucosa is moist and without lesions.  RESPIRATORY: Breathing is even & unlabored, BS CTAB CARDIAC: RRR, no murmur,no extra heart sounds, BLE 2+ edema GI: Abdomen soft, normal BS, no masses, no tenderness NEUROLOGICAL: There is no tremor. Speech is clear. Alert and oriented X 3. PSYCHIATRIC:  Affect and behavior are appropriate  Labs reviewed: Recent Labs    03/31/21 0639 04/01/21 0041 04/02/21 0056  NA 141 140 139  K 3.6 4.8 4.1  CL 104 103 99  CO2 29 28 32  GLUCOSE 119* 139* 157*  BUN 9 7* 7*  CREATININE 0.88 0.67 0.79  CALCIUM 7.8* 8.1* 8.2*  MG 1.8 1.9 1.9  PHOS  --  1.9* 2.9   Recent Labs    01/19/21 0211 01/24/21 1156 03/30/21 1200 04/01/21 0041 04/02/21 0056  AST 14* 23 20  --   --   ALT 23 41* 22  --   --   ALKPHOS 60 72 74  --   --   BILITOT 0.6 0.4 0.8  --   --   PROT 4.7* 5.8* 5.4*  --   --   ALBUMIN 2.3* 3.4* 3.0* 2.5* 2.6*   Recent Labs    01/24/21 1156 01/30/21 1444 03/30/21 1200 03/30/21 1248 03/31/21 0639 04/01/21 0041 04/02/21 0056  WBC 6.9 7.1 9.5  --  7.3 7.4 5.4  NEUTROABS 5.3 4.8 5.4  --   --   --   --   HGB 11.8* 12.3 12.3   < > 11.2* 11.1*  11.0*  HCT 37.7 39.2 40.4   < > 36.5 36.3 36.4  MCV 95.3 95.7 102.3*  --  99.7 99.7 98.4  PLT 197.0 212.0 119*  --  98* 106* 126*   < > = values in this interval not displayed.   Lab Results  Component Value Date   TSH 4.598 (H) 01/16/2021   Lab Results  Component Value Date   HGBA1C 6.5 (H) 04/02/2021   Lab Results  Component Value Date   CHOL 136 09/26/2020   HDL 40.00 09/26/2020   LDLCALC 75 09/22/2019   LDLDIRECT 59.0 09/26/2020   TRIG 283.0 (H) 09/26/2020   CHOLHDL 3 09/26/2020    Significant Diagnostic Results in last 30 days:  CT ABDOMEN PELVIS WO CONTRAST  Result Date: 03/31/2021 CLINICAL DATA:  Abdominal pain, sepsis EXAM: CT ABDOMEN AND PELVIS WITHOUT CONTRAST TECHNIQUE: Multidetector CT imaging of the abdomen and pelvis was performed following the standard protocol without IV contrast. COMPARISON:  CTA abdomen/pelvis dated 01/16/2021 FINDINGS: Lower chest: Emphysematous changes at the lung bases. Hepatobiliary: Unenhanced liver is grossly unremarkable. Layering gallbladder sludge (series 3/image 27), without associated inflammatory changes. No intrahepatic or extrahepatic duct dilatation. Pancreas: Within normal limits. Spleen: Within normal limits. Adrenals/Urinary Tract: Adrenal glands are within  normal limits. Bilateral renal cysts, including a 12 mm hemorrhagic cyst in the medial left upper kidney (series 3/image 31) and simple cysts measuring up to 5.3 cm in the lateral right lower kidney (series 3/image 46). Three nonobstructing right lower pole renal calculi measuring up to 2 mm. No hydronephrosis. Bladder is notable for trace nondependent gas but is otherwise within normal limits. Stomach/Bowel: Stomach is within normal limits. No evidence of bowel obstruction. Appendix is not discretely visualized. No colonic wall thickening or inflammatory changes. Vascular/Lymphatic: No evidence of abdominal aortic aneurysm. Atherosclerotic calcifications of the abdominal aorta and  branch vessels. To lymph nodes Reproductive: Status post hysterectomy. No adnexal masses. Other: No abdominopelvic ascites. Musculoskeletal: Degenerative changes of the visualized thoracolumbar spine. Status post ORIF of the left hip. IMPRESSION: No CT findings to account for the patient's abdominal pain. Gallbladder sludge, without associated inflammatory changes. Three nonobstructing right lower pole renal calculi measure up to 2 mm. No hydronephrosis. Additional ancillary findings as above. Electronically Signed   By: Julian Hy M.D.   On: 03/31/2021 01:01   CT HEAD WO CONTRAST (5MM)  Result Date: 03/30/2021 CLINICAL DATA:  Mental status change EXAM: CT HEAD WITHOUT CONTRAST TECHNIQUE: Contiguous axial images were obtained from the base of the skull through the vertex without intravenous contrast. COMPARISON:  Head CT dated January 16, 2021 FINDINGS: Brain: Chronic white matter ischemic change. No evidence of acute infarction, hemorrhage, hydrocephalus, extra-axial collection or mass lesion/mass effect. Vascular: No hyperdense vessel or unexpected calcification. Skull: Normal. Negative for fracture or focal lesion. Sinuses/Orbits: No acute finding. Other: None. IMPRESSION: No acute intracranial abnormality. Electronically Signed   By: Yetta Glassman M.D.   On: 03/30/2021 12:36   DG Chest Port 1 View  Result Date: 03/30/2021 CLINICAL DATA:  80 year old female with possible sepsis. EXAM: PORTABLE CHEST 1 VIEW COMPARISON:  Chest x-ray 01/16/2021. FINDINGS: Lung volumes are low. Widespread areas of interstitial prominence and diffuse peribronchial cuffing noted in the lungs bilaterally, most severe there out the mid to lower lungs. No definite confluent consolidative airspace disease. No pleural effusions. No pneumothorax. Crowding of the pulmonary vasculature. No evidence of pulmonary edema. Heart size is normal. Mediastinal contours are distorted by patient positioning. IMPRESSION: 1. The  appearance the chest is very similar to the prior chest radiograph, suggestive of interstitial lung disease. Outpatient referral to Pulmonology for further clinical evaluation is recommended. Additionally, follow-up nonemergent high-resolution chest CT is recommended in the near future to better evaluate these findings. Electronically Signed   By: Vinnie Langton M.D.   On: 03/30/2021 12:27    Assessment/Plan  1. Chronic diastolic CHF (congestive heart failure) (HCC) -  will give Metolazone 2.5 mg PO X  1 -  continue furosemide 80 mg daily  2. 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  Labs/tests ordered:    Check BMP with GFR on 04/22/2021  Goals of care:   Short-term care   Durenda Age, DNP, MSN, FNP-BC [facility] [phone] (Monday-Friday 8:00 a.m. - 5:00 p.m.) [phone] (after hours)

## 2021-04-22 ENCOUNTER — Inpatient Hospital Stay: Admission: RE | Admit: 2021-04-22 | Payer: Medicare Other | Source: Ambulatory Visit

## 2021-04-28 ENCOUNTER — Encounter: Payer: Self-pay | Admitting: Family Medicine

## 2021-04-29 NOTE — Telephone Encounter (Signed)
Spoke with pt, appointment scheduled.

## 2021-04-30 ENCOUNTER — Other Ambulatory Visit: Payer: Self-pay | Admitting: Adult Health

## 2021-04-30 ENCOUNTER — Non-Acute Institutional Stay: Payer: Medicare Other | Admitting: Hospice

## 2021-04-30 ENCOUNTER — Other Ambulatory Visit: Payer: Self-pay

## 2021-04-30 DIAGNOSIS — R0602 Shortness of breath: Secondary | ICD-10-CM

## 2021-04-30 DIAGNOSIS — R531 Weakness: Secondary | ICD-10-CM

## 2021-04-30 DIAGNOSIS — I5032 Chronic diastolic (congestive) heart failure: Secondary | ICD-10-CM

## 2021-04-30 DIAGNOSIS — Z515 Encounter for palliative care: Secondary | ICD-10-CM

## 2021-04-30 MED ORDER — MORPHINE SULFATE 15 MG PO TABS: ORAL_TABLET | ORAL | 0 refills | Status: DC

## 2021-04-30 NOTE — Progress Notes (Signed)
[facility] Palliative Care Consult Note Telephone: [phone]  Fax: [phone]  PATIENT NAME: [name] DOB: January 07, 1941 MRN: [id number]  PRIMARY CARE PROVIDER:   [name], MD [name], MD [place],  [place] [address]  REFERRING PROVIDER: [name], MD [name], MD [place],  [place] [address]  RESPONSIBLE PARTY:  Self Contact Information     Name Relation Home Work Mobile   Farrow,Curtis Son [phone]  [phone]   Linna Darner [phone]  [phone]   Jiali, Linney [phone]  [phone]   Shelly Coss Daughter   [phone]   Jerelyn Scott Daughter   [phone]       Visit is to build trust and highlight Palliative Medicine as specialized medical care for people living with serious illness, aimed at facilitating better quality of life through symptoms relief, assisting with advance care planning and complex medical decision making. This is a follow up visit.  RECOMMENDATIONS/PLAN:   Advance Care Planning/Code Status: Patient affirmed she is a Do Not Resuscitate.  Goals of Care: Goals of care include to maximize quality of life and symptom management.  Visit consisted of counseling and education dealing with the complex and emotionally intense issues of symptom management and palliative care in the setting of serious and potentially life-threatening illness. Palliative care team will continue to support patient, patient's family, and medical team.  Symptom management/Plan: Weakness: PT/OT is ongoing for strengthening and gait training.  Shortness of breath secondary to Interstitial Lung Disease, CHF, COPD. Continue to oxygen supplementation at 3L/Min; monitor respiratory status CHF: Managed with Lasix. Education on reduced salt/fluids. Monitor weight closely and report weight gain of 3 Ibs in a day or 5 Ibs in a week for additional  diuresis/change in plan. Routine CBC BMP. Follow up: Palliative care will continue to follow for complex medical decision making, advance care planning, and clarification of goals. Return 6 weeks or prn. Encouraged to call provider sooner with any concerns.  CHIEF COMPLAINT: Palliative follow up  HISTORY OF PRESENT ILLNESS:  [name] a 80 y.o. female with multiple medical problems including weakness, worsened in the last month following hospitalization for sepsis with acute renal failure 11/12-11/16/2022; resolved. Weakness is worse during activities and impairs her independence. She reports she participates in PT/OT and this is helpful; denies pain/discomfort. Patient is currently on oxygen supplementation 3L/Min for shortness of breath secondary to Interstitial Lung Disease History of Morbid obesity, ILD, COPD, CHF. History obtained from review of EMR, discussion with primary team, family and/or patient. Records reviewed and summarized above. All 10 point systems reviewed and are negative except as documented in history of present illness above  Review and summarization of Epic records shows history from other than patient.   Palliative Care was asked to follow this patient o help address complex decision making in the context of advance care planning and goals of care clarification. I reviewed, as needed, available labs, patient records, imaging, studies and related documents from the EMR.    PHYSICAL EXAM  Height/Weight: 5 feet 4 inches/228. BMI 39.13 General: In no acute distress, appropriately dressed Cardiovascular: regular rate and rhythm; trace edema in BLE Pulmonary: no cough, no increased work of breathing, normal respiratory effort Abdomen: soft, non tender, no guarding, positive bowel sounds in all quadrants GU:  no suprapubic tenderness Eyes: Normal lids, no discharge ENMT: Moist mucous membranes Musculoskeletal:  weakness, ambulatory with rolling walker for support Skin:  no rash  to visible skin, warm without cyanosis,  Psych: non-anxious affect Neurological: Weakness but otherwise non focal Heme/lymph/immuno: no bruises, no bleeding  PERTINENT MEDICATIONS:  Outpatient Encounter Medications as of 04/30/2021  Medication Sig   acetaminophen (TYLENOL) 500 MG tablet Take 1,000 mg by mouth every 6 (six) hours as needed for moderate pain.    albuterol (PROVENTIL HFA;VENTOLIN HFA) 108 (90 BASE) MCG/ACT inhaler Inhale 2 puffs into the lungs every 6 (six) hours as needed for wheezing or shortness of breath.   allopurinol (ZYLOPRIM) 300 MG tablet Take 300 mg by mouth daily.   Artificial Tear Ointment (DRY EYES OP) Apply 1 drop to eye daily as needed (dry eyes).   atorvastatin (LIPITOR) 20 MG tablet Take 0.5 tablets (10 mg total) by mouth every evening.   Calcium Carb-Cholecalciferol (CALCIUM + D3 PO) Take 2 tablets by mouth daily with lunch.    cholecalciferol (VITAMIN D) 1000 UNITS tablet Take 1,000 Units by mouth daily with lunch.   colchicine 0.6 MG tablet Take 0.6 mg by mouth daily as needed (gout flares).   cyclobenzaprine (FLEXERIL) 5 MG tablet Take 1 tablet (5 mg total) by mouth at bedtime as needed for muscle spasms.   diclofenac sodium (VOLTAREN) 1 % GEL Apply 2 g topically 3 (three) times daily. Both hands and knees   DULoxetine (CYMBALTA) 60 MG capsule Take 1 capsule (60 mg total) by mouth daily.   fluticasone (FLONASE) 50 MCG/ACT nasal spray Place 2 sprays into both nostrils daily.   furosemide (LASIX) 80 MG tablet Take 1 tablet (80 mg total) by mouth daily.   gabapentin (NEURONTIN) 600 MG tablet Take 1 tablet (600 mg total) by mouth 3 (three) times daily.   guaiFENesin (MUCINEX) 600 MG 12 hr tablet Take 1,200 mg by mouth 2 (two) times daily.   hydroxychloroquine (PLAQUENIL) 200 MG tablet Take 200 mg by mouth daily.   leflunomide (ARAVA) 20 MG tablet Take 1 tablet (20 mg total) by mouth daily.   levothyroxine (SYNTHROID) 50 MCG tablet Take 1 tablet (50 mcg  total) by mouth daily.   linaclotide (LINZESS) 72 MCG capsule Take 1 capsule (72 mcg total) by mouth daily before breakfast.   metolazone (ZAROXOLYN) 2.5 MG tablet TAKE 1 TABLET BY MOUTH DAILY AS NEEDED FOR 2 DAYS FOR WEIGHT ABOVE 255 POUNDS   metoprolol succinate (TOPROL-XL) 25 MG 24 hr tablet Take 5 tablets (125 mg total) by mouth daily.   morphine (MS CONTIN) 15 MG 12 hr tablet Take 1 tablet (15 mg total) by mouth every 12 (twelve) hours.   morphine (MSIR) 15 MG tablet One tablet twice a day as needed for break through pain.   Multiple Vitamin (MULTIVITAMIN WITH MINERALS) TABS Take 1 tablet by mouth daily.    olmesartan (BENICAR) 40 MG tablet Take 1 tablet (40 mg total) by mouth daily.   OXYGEN-HELIUM IN Inhale 2 L into the lungs at bedtime. And on exertion   pantoprazole (PROTONIX) 40 MG tablet take 1 tablet by mouth once daily MINUTES BEFORE 1ST MEAL OF THE DAY   Potassium Chloride ER 20 MEQ TBCR Take 20 mEq by mouth daily.   predniSONE (DELTASONE) 5 MG tablet Take 10 mg by mouth every morning.    Semaglutide,0.25 or 0.5MG/DOS, (OZEMPIC, 0.25 OR 0.5 MG/DOSE,) 2 MG/1.5ML SOPN Inject 0.25 mg as directed once a week.   sodium chloride (OCEAN) 0.65 % SOLN nasal spray Place 1 spray into both nostrils as needed for congestion.   No facility-administered encounter medications on file as  of 04/30/2021.    HOSPICE ELIGIBILITY/DIAGNOSIS: TBD  PAST MEDICAL HISTORY:  Past Medical History:  Diagnosis Date   Acute on chronic diastolic (congestive) heart failure (HCC)    Anemia    iron deficiency anemia - secondary to blood loss ( chronic)    Anxiety    Arthritis    endstage changes bilateral knees/bilateral ankles.    Asthma    Carotid artery occlusion    Chronic fatigue    Chronic kidney disease    Closed left hip fracture (HCC)    Clotting disorder ([place])    pt denies this   Contusion of left knee    due to fall 1/14.   COPD (chronic obstructive pulmonary disease) (HCC)    pulmonary  fibrosis   Depression, reactive    Diabetes mellitus    type II    Diastolic dysfunction    Difficulty in walking    Family history of heart disease    Generalized muscle weakness    Gout    High cholesterol    History of falling    Hypertension    Hypothyroidism    Interstitial lung disease ([place])    Meningioma of left sphenoid wing involving cavernous sinus ([place]) 02/17/2012   Continue diplopia, left eye pain and left headaches.     Morbid obesity ([place])    Neuromuscular disorder ([place])    diabetic neuropathy    Normal coronary arteries    cardiac catheterization performed  10/31/14   Pneumonia    RA (rheumatoid arthritis) ([place])    has been off methotreaxte since 10/13.   Spinal stenosis of lumbar region    Thyroid disease    Unspecified lack of coordination    URI (upper respiratory infection)       ALLERGIES:  Allergies  Allergen Reactions   Codeine Swelling and Other (See Comments)    Facial swelling Chest pain and swelling in legs  Facial swelling   Infliximab Anaphylaxis    "sent me into shock" "sent me into shock"   Lisinopril Swelling    Face and neck swelling Face and neck swelling      I spent  40 minutes providing this consultation; this includes time spent with patient/family, chart review and documentation. More than 50% of the time in this consultation was spent on counseling and E[place]   Thank you for the opportunity to participate in the care of [place] Please call our office at [phone] if we can be of additional assistance.  Note: Portions of this note were generated with [employer]obby[employer]. Dictation errors may occur despite best attempts at proofreading.  Teodoro Spray, NP

## 2021-05-01 ENCOUNTER — Non-Acute Institutional Stay (SKILLED_NURSING_FACILITY): Payer: Medicare Other | Admitting: Adult Health

## 2021-05-01 ENCOUNTER — Encounter: Payer: Self-pay | Admitting: Adult Health

## 2021-05-01 DIAGNOSIS — I5032 Chronic diastolic (congestive) heart failure: Secondary | ICD-10-CM | POA: Diagnosis not present

## 2021-05-01 DIAGNOSIS — N189 Chronic kidney disease, unspecified: Secondary | ICD-10-CM

## 2021-05-01 DIAGNOSIS — I10 Essential (primary) hypertension: Secondary | ICD-10-CM

## 2021-05-01 DIAGNOSIS — M05741 Rheumatoid arthritis with rheumatoid factor of right hand without organ or systems involvement: Secondary | ICD-10-CM

## 2021-05-01 DIAGNOSIS — N3000 Acute cystitis without hematuria: Secondary | ICD-10-CM

## 2021-05-01 DIAGNOSIS — G9340 Encephalopathy, unspecified: Secondary | ICD-10-CM

## 2021-05-01 DIAGNOSIS — E1140 Type 2 diabetes mellitus with diabetic neuropathy, unspecified: Secondary | ICD-10-CM

## 2021-05-01 DIAGNOSIS — E782 Mixed hyperlipidemia: Secondary | ICD-10-CM

## 2021-05-01 DIAGNOSIS — R5382 Chronic fatigue, unspecified: Secondary | ICD-10-CM

## 2021-05-01 DIAGNOSIS — E039 Hypothyroidism, unspecified: Secondary | ICD-10-CM | POA: Diagnosis not present

## 2021-05-01 DIAGNOSIS — M545 Low back pain, unspecified: Secondary | ICD-10-CM

## 2021-05-01 DIAGNOSIS — G8929 Other chronic pain: Secondary | ICD-10-CM

## 2021-05-01 DIAGNOSIS — N179 Acute kidney failure, unspecified: Secondary | ICD-10-CM

## 2021-05-01 DIAGNOSIS — M05742 Rheumatoid arthritis with rheumatoid factor of left hand without organ or systems involvement: Secondary | ICD-10-CM

## 2021-05-01 DIAGNOSIS — K219 Gastro-esophageal reflux disease without esophagitis: Secondary | ICD-10-CM

## 2021-05-01 DIAGNOSIS — J849 Interstitial pulmonary disease, unspecified: Secondary | ICD-10-CM | POA: Diagnosis not present

## 2021-05-01 DIAGNOSIS — K5903 Drug induced constipation: Secondary | ICD-10-CM

## 2021-05-01 DIAGNOSIS — M1A09X0 Idiopathic chronic gout, multiple sites, without tophus (tophi): Secondary | ICD-10-CM | POA: Diagnosis not present

## 2021-05-01 DIAGNOSIS — K5909 Other constipation: Secondary | ICD-10-CM

## 2021-05-02 ENCOUNTER — Encounter: Payer: Self-pay | Admitting: Adult Health

## 2021-05-02 ENCOUNTER — Non-Acute Institutional Stay (SKILLED_NURSING_FACILITY): Payer: Medicare Other | Admitting: Adult Health

## 2021-05-02 DIAGNOSIS — U071 COVID-19: Secondary | ICD-10-CM | POA: Diagnosis not present

## 2021-05-02 NOTE — Progress Notes (Signed)
Location:  [facility] Room Number: [place] of Service:  SNF (31) Provider:  Durenda Age, DNP, FNP-BC  Patient Care Team: [name], MD as PCP - General (Family Medicine) Lorretta Harp, MD as PCP - Cardiology (Cardiology) Duffy, Creola Corn, LCSW as Social Worker (Licensed Clinical Social Worker) Kidney, Nelida Meuse, [place], MD as Consulting Physician (Rheumatology) Efraim Kaufmann, NP (Rheumatology) Theodosia Blender, MD as Referring Physician (Pulmonary Disease)  Extended Emergency Contact Information Primary Emergency Contact: Farrow,Curtis Address: [address]          [place], [place] [address] [place] of [place] Phone: [phone] Mobile Phone: [phone] Relation: Son Secondary Emergency Contact: Fonnie Jarvis Address: [address] [place] [address] [place] Phone: [phone] Mobile Phone: [phone] Relation: Daughter  Code Status:  DNR  Goals of care: Advanced Directive information Advanced Directives 05/02/2021  Does Patient Have a Medical Advance Directive? Yes  Type of Advance Directive Out of facility DNR (pink MOST or yellow form)  Does patient want to make changes to medical advance directive? No - Patient declined  Copy of [place] in Chart? -  Would patient like information on creating a medical advance directive? -  Pre-existing out of facility DNR order (yellow form or pink MOST form) Pink MOST form placed in chart (order not valid for inpatient use)     Chief Complaint  Patient presents with   Acute Visit    Acute visit for positive COVID-19 Test.    HPI:  Pt is a 80 y.o. female seen who had an acute visit for testing positive for rapid COVID-19 test during a routine facility testing. She is for discharge home on 05/03/21 and plans to continue to isolate in her own room prepared by her family. She denies having fever nor chills. She  has 3 COVID-19 vaccines. She had a short-term rehabilitation at [facility].  She has PMH of ILD, diabetes mellitus and RA.      Past Medical History:  Diagnosis Date   Acute on chronic diastolic (congestive) heart failure (HCC)    Anemia    iron deficiency anemia - secondary to blood loss ( chronic)    Anxiety    Arthritis    endstage changes bilateral knees/bilateral ankles.    Asthma    Carotid artery occlusion    Chronic fatigue    Chronic kidney disease    Closed left hip fracture (HCC)    Clotting disorder ([place])    pt denies this   Contusion of left knee    due to fall 1/14.   COPD (chronic obstructive pulmonary disease) (HCC)    pulmonary fibrosis   Depression, reactive    Diabetes mellitus    type II    Diastolic dysfunction    Difficulty in walking    Family history of heart disease    Generalized muscle weakness    Gout    High cholesterol    History of falling    Hypertension    Hypothyroidism    Interstitial lung disease ([place])    Meningioma of left sphenoid wing involving cavernous sinus ([facility]) 02/17/2012   Continue diplopia, left eye pain and left headaches.     Morbid obesity ([place])    Neuromuscular disorder ([place])    diabetic neuropathy    Normal coronary arteries    cardiac catheterization performed  10/31/14   Pneumonia  RA (rheumatoid arthritis) ([place])    has been off methotreaxte since 10/13.   Spinal stenosis of lumbar region    Thyroid disease    Unspecified lack of coordination    URI (upper respiratory infection)    Past Surgical History:  Procedure Laterality Date   ABDOMINAL HYSTERECTOMY     BRAIN SURGERY     Gamma knife 10/13. Needs repeat spring  '14   CARDIAC CATHETERIZATION N/A 10/31/2014   Procedure: Right/Left Heart Cath and Coronary Angiography;  Surgeon: [name], MD;  Location: [facility];  Service: Cardiovascular;  Laterality: N/A;   ESOPHAGOGASTRODUODENOSCOPY (EGD) WITH PROPOFOL N/A  09/14/2014   Procedure: ESOPHAGOGASTRODUODENOSCOPY (EGD) WITH PROPOFOL;  Surgeon: [name], MD;  Location: [facility];  Service: Endoscopy;  Laterality: N/A;   INCISION AND DRAINAGE HIP Left 01/16/2017   Procedure: IRRIGATION AND DEBRIDEMENT LEFT HIP;  Surgeon: [name], MD;  Location: [facility];  Service: Orthopedics;  Laterality: Left;   INTRAMEDULLARY (IM) NAIL INTERTROCHANTERIC Left 11/29/2016   Procedure: INTRAMEDULLARY (IM) NAIL INTERTROCHANTRIC;  Surgeon: [name], MD;  Location: [facility];  Service: Orthopedics;  Laterality: Left;   OVARY SURGERY     SHOULDER SURGERY Left    TONSILLECTOMY  age 73   VIDEO BRONCHOSCOPY Bilateral 05/31/2013   Procedure: VIDEO BRONCHOSCOPY WITHOUT FLUORO;  Surgeon: Brand Males, MD;  Location: [facility];  Service: Cardiopulmonary;  Laterality: Bilateral;   video bronscoscopy  2000   lung    Allergies  Allergen Reactions   Codeine Swelling and Other (See Comments)    Facial swelling Chest pain and swelling in legs  Facial swelling   Infliximab Anaphylaxis    "sent me into shock" "sent me into shock"   Lisinopril Swelling    Face and neck swelling Face and neck swelling    Outpatient Encounter Medications as of 05/02/2021  Medication Sig   acetaminophen (TYLENOL) 500 MG tablet Take 1,000 mg by mouth every 6 (six) hours as needed for moderate pain.    Artificial Tear Ointment (DRY EYES OP) Apply 1 drop to eye daily as needed (dry eyes).   Calcium Carb-Cholecalciferol (CALCIUM + D3 PO) Take 2 tablets by mouth daily with lunch.    Cholecalciferol (VITAMIN D-3) 25 MCG (1000 UT) CAPS Take 2 capsules (2,000 Units total) by mouth daily. GIVE ONE TAB PO Q DAY   fluticasone (FLONASE) 50 MCG/ACT nasal spray Place 2 sprays into both nostrils daily.   morphine (MS CONTIN) 15 MG 12 hr tablet Take 1 tablet (15 mg total) by mouth every 12 (twelve) hours.   morphine (MSIR) 15 MG tablet One tablet twice a day as needed for  break through pain.   Multiple Vitamin (MULTIVITAMIN WITH MINERALS) TABS Take 1 tablet by mouth daily.    OXYGEN-HELIUM IN Inhale 3 L into the lungs at bedtime. And on exertion   sodium chloride (OCEAN) 0.65 % SOLN nasal spray Place 1 spray into both nostrils as needed for congestion.   [DISCONTINUED] albuterol (PROVENTIL HFA;VENTOLIN HFA) 108 (90 BASE) MCG/ACT inhaler Inhale 2 puffs into the lungs every 6 (six) hours as needed for wheezing or shortness of breath.   [DISCONTINUED] albuterol (PROVENTIL) (2.5 MG/3ML) 0.083% nebulizer solution Take 1 ampule by nebulization every 6 (six) hours as needed for wheezing.   [DISCONTINUED] allopurinol (ZYLOPRIM) 300 MG tablet Take 300 mg by mouth daily.   [DISCONTINUED] aspirin 325 MG tablet Take 325 mg by mouth daily.   [DISCONTINUED] atorvastatin (LIPITOR) 20 MG  tablet Take 0.5 tablets (10 mg total) by mouth every evening.   [DISCONTINUED] cholecalciferol (VITAMIN D) 1000 UNITS tablet Take 1,000 Units by mouth daily with lunch.   [DISCONTINUED] Cholecalciferol (VITAMIN D-3 PO) Take 2,000 Units by mouth. GIVE ONE TAB PO Q DAY   [DISCONTINUED] colchicine 0.6 MG tablet Take 0.6 mg by mouth daily as needed (gout flares).   [DISCONTINUED] cyclobenzaprine (FLEXERIL) 5 MG tablet Take 1 tablet (5 mg total) by mouth at bedtime as needed for muscle spasms.   [DISCONTINUED] diclofenac sodium (VOLTAREN) 1 % GEL Apply 2 g topically 3 (three) times daily. Both hands and knees   [DISCONTINUED] DULoxetine (CYMBALTA) 60 MG capsule Take 1 capsule (60 mg total) by mouth daily.   [DISCONTINUED] furosemide (LASIX) 80 MG tablet Take 1 tablet (80 mg total) by mouth daily.   [DISCONTINUED] gabapentin (NEURONTIN) 600 MG tablet Take 1 tablet (600 mg total) by mouth 3 (three) times daily. (Patient taking differently: Take 1,200 mg by mouth 3 (three) times daily.)   [DISCONTINUED] guaiFENesin (MUCINEX) 600 MG 12 hr tablet Take 1,200 mg by mouth 2 (two) times daily.   [DISCONTINUED]  hydroxychloroquine (PLAQUENIL) 200 MG tablet Take 200 mg by mouth daily.   [DISCONTINUED] leflunomide (ARAVA) 20 MG tablet Take 1 tablet (20 mg total) by mouth daily.   [DISCONTINUED] levothyroxine (SYNTHROID) 50 MCG tablet Take 1 tablet (50 mcg total) by mouth daily.   [DISCONTINUED] linaclotide (LINZESS) 72 MCG capsule Take 1 capsule (72 mcg total) by mouth daily before breakfast.   [DISCONTINUED] metolazone (ZAROXOLYN) 2.5 MG tablet TAKE 1 TABLET BY MOUTH DAILY AS NEEDED FOR 2 DAYS FOR WEIGHT ABOVE 255 POUNDS   [DISCONTINUED] metoprolol succinate (TOPROL-XL) 25 MG 24 hr tablet Take 5 tablets (125 mg total) by mouth daily.   [DISCONTINUED] molnupiravir EUA (LAGEVRIO) 200 MG CAPS capsule Take 4 capsules by mouth 2 (two) times daily.   [DISCONTINUED] olmesartan (BENICAR) 40 MG tablet Take 1 tablet (40 mg total) by mouth daily.   [DISCONTINUED] pantoprazole (PROTONIX) 40 MG tablet take 1 tablet by mouth once daily MINUTES BEFORE 1ST MEAL OF THE DAY   [DISCONTINUED] Potassium Chloride ER 20 MEQ TBCR Take 20 mEq by mouth daily.   [DISCONTINUED] predniSONE (DELTASONE) 5 MG tablet Take 5 mg by mouth every morning.   [DISCONTINUED] Semaglutide,0.25 or 0.5MG/DOS, (OZEMPIC, 0.25 OR 0.5 MG/DOSE,) 2 MG/1.5ML SOPN Inject 0.25 mg as directed once a week.   [DISCONTINUED] zinc sulfate 220 (50 Zn) MG capsule Take 220 mg by mouth daily.   molnupiravir EUA (LAGEVRIO) 200 MG CAPS capsule Take 4 capsules (800 mg total) by mouth 2 (two) times daily for 4 days.   zinc sulfate 220 (50 Zn) MG capsule Take 1 capsule (220 mg total) by mouth daily for 9 days.   No facility-administered encounter medications on file as of 05/02/2021.    Review of Systems  GENERAL: No change in appetite, no fatigue, no weight changes, no fever or chills  MOUTH and THROAT: Denies oral discomfort, gingival pain or bleeding RESPIRATORY: no wheezing or hemoptysis CARDIAC: No chest pain or palpitations GI: No abdominal pain, diarrhea,  constipation, heart burn, nausea or vomiting GU: Denies dysuria, frequency, hematuria, incontinence, or discharge NEUROLOGICAL: Denies dizziness, syncope, numbness, or headache PSYCHIATRIC: Denies feelings of depression or anxiety. No report of hallucinations, insomnia, paranoia, or agitation   Immunization History  Administered Date(s) Administered   Fluad Quad(high Dose 65+) 02/02/2019, 03/11/2021   Influenza Split 01/29/2012, 03/30/2013, 02/10/2014   Influenza, High Dose Seasonal  PF 02/16/2017, 02/11/2018, 01/31/2019, 02/27/2020   Influenza,inj,Quad PF,6+ Mos 02/09/2015, 12/18/2015   Influenza-Unspecified 02/09/2015, 12/18/2015   PFIZER Comirnaty(Gray Top)Covid-19 Tri-Sucrose Vaccine 07/24/2019, 08/14/2019, 02/27/2020   PFIZER(Purple Top)SARS-COV-2 Vaccination 07/17/2019, 08/14/2019   PPD Test 04/23/2015   Pneumococcal Conjugate-13 08/21/2014   Pneumococcal Polysaccharide-23 02/17/2012   Tdap 12/27/2015   Pertinent  Health Maintenance Due  Topic Date Due   FOOT EXAM  05/23/2020   OPHTHALMOLOGY EXAM  03/15/2021   HEMOGLOBIN A1C  09/30/2021   INFLUENZA VACCINE  Completed   DEXA SCAN  Completed   Fall Risk 04/01/2021 04/01/2021 04/02/2021 04/03/2021 04/03/2021  Falls in the past year? - - - - -  Number of falls in past year - - - - -  Was there an injury with Fall? - - - - -  Was there an injury with Fall? - - - - -  Fall Risk Category Calculator - - - - -  Fall Risk Category - - - - -  Patient Fall Risk Level _0   Patient at Risk for Falls Due to - - - - -  Patient at Risk for Falls Due to - - - - -  Fall risk Follow up - - - - -  Fall risk Follow up - - - - -     Vitals:   05/02/21 1513  BP: 137/88  Pulse: 76  Resp: 20  Temp: 97.7 F (36.5 C)  Weight: 229 lb 6.4 oz (104.1 kg)  Height: _1  (1.626 m)   Body mass index is 39.38 kg/m.  Physical Exam  GENERAL APPEARANCE: Well nourished. In  no acute distress.  Morbidly obese  SKIN:  Skin is warm and dry.  MOUTH and THROAT: Lips are without lesions. Oral mucosa is moist and without lesions.  RESPIRATORY: Breathing is even & unlabored, BS CTAB CARDIAC: RRR, no murmur,no extra heart sounds, BLE 2+ edema GI: Abdomen soft, normal BS, no masses, no tenderness, NEUROLOGICAL: There is no tremor. Speech is clear. Alert and oriented X 3. PSYCHIATRIC:  Affect and behavior are appropriate  Labs reviewed: Recent Labs    03/31/21 0639 04/01/21 0041 04/02/21 0056  NA 141 140 139  K 3.6 4.8 4.1  CL 104 103 99  CO2 29 28 32  GLUCOSE 119* 139* 157*  BUN 9 7* 7*  CREATININE 0.88 0.67 0.79  CALCIUM 7.8* 8.1* 8.2*  MG 1.8 1.9 1.9  PHOS  --  1.9* 2.9   Recent Labs    01/19/21 0211 01/24/21 1156 03/30/21 1200 04/01/21 0041 04/02/21 0056  AST 14* 23 20  --   --   ALT 23 41* 22  --   --   ALKPHOS 60 72 74  --   --   BILITOT 0.6 0.4 0.8  --   --   PROT 4.7* 5.8* 5.4*  --   --   ALBUMIN 2.3* 3.4* 3.0* 2.5* 2.6*   Recent Labs    01/24/21 1156 01/30/21 1444 03/30/21 1200 03/30/21 1248 03/31/21 0639 04/01/21 0041 04/02/21 0056  WBC 6.9 7.1 9.5  --  7.3 7.4 5.4  NEUTROABS 5.3 4.8 5.4  --   --   --   --   HGB 11.8* 12.3 12.3   < > 11.2* 11.1* 11.0*  HCT 37.7 39.2 40.4   < > 36.5 36.3 36.4  MCV 95.3 95.7 102.3*  --  99.7 99.7 98.4  PLT 197.0  212.0 119*  --  98* 106* 126*   < > = values in this interval not displayed.   Lab Results  Component Value Date   TSH 4.598 (H) 01/16/2021   Lab Results  Component Value Date   HGBA1C 6.5 (H) 04/02/2021   Lab Results  Component Value Date   CHOL 136 09/26/2020   HDL 40.00 09/26/2020   LDLCALC 75 09/22/2019   LDLDIRECT 59.0 09/26/2020   TRIG 283.0 (H) 09/26/2020   CHOLHDL 3 09/26/2020    Significant Diagnostic Results in last 30 days:  No results found.  Assessment/Plan  1. COVID-19 -  currently on ASA 325 mg daily and Prednisone 5 mg daily - molnupiravir EUA  (LAGEVRIO) 200 MG CAPS capsule; Take 4 capsules (800 mg total) by mouth 2 (two) times daily for 4 days.  Dispense: 32 capsule; Refill: 0 - zinc sulfate 220 (50 Zn) MG capsule; Take 1 capsule (220 mg total) by mouth daily for 9 days.  Dispense: 9 capsule; Refill: 0 - Cholecalciferol (VITAMIN D-3) 25 MCG (1000 UT) CAPS; Take 2 capsules (2,000 Units total) by mouth daily. GIVE ONE TAB PO Q DAY  Dispense: 60 capsule; Refill: 0   Family/ staff Communication:   Discussed plan of care with resident and charge nurse.    LAbs/tests ordered: CBC with differentials, BMP with GFR, D-dimer and chest x-ray PA and lateral  Goals of care:   Short-term care   Durenda Age, DNP, MSN, FNP-BC [facility] [phone] (Monday-Friday 8:00 a.m. - 5:00 p.m.) [phone] (after hours)

## 2021-05-03 MED ORDER — DULOXETINE HCL 60 MG PO CPEP: 60.0000 mg | ORAL_CAPSULE | Freq: Every day | ORAL | 0 refills | Status: DC

## 2021-05-03 MED ORDER — GUAIFENESIN ER 600 MG PO TB12
1200.0000 mg | ORAL_TABLET | Freq: Two times a day (BID) | ORAL | 0 refills | Status: DC
Start: 2021-05-03 — End: 2023-08-04

## 2021-05-03 MED ORDER — METOLAZONE 2.5 MG PO TABS: ORAL_TABLET | ORAL | 0 refills | Status: DC

## 2021-05-03 MED ORDER — ALLOPURINOL 300 MG PO TABS: 300.0000 mg | ORAL_TABLET | Freq: Every day | ORAL | 0 refills | Status: DC

## 2021-05-03 MED ORDER — OLMESARTAN MEDOXOMIL 40 MG PO TABS
40.0000 mg | ORAL_TABLET | Freq: Every day | ORAL | 0 refills | Status: DC
Start: 2021-05-03 — End: 2021-12-09

## 2021-05-03 MED ORDER — ZINC SULFATE 220 (50 ZN) MG PO CAPS: 220.0000 mg | ORAL_CAPSULE | Freq: Every day | ORAL | 0 refills | Status: AC

## 2021-05-03 MED ORDER — LEVOTHYROXINE SODIUM 50 MCG PO TABS: 50.0000 ug | ORAL_TABLET | Freq: Every day | ORAL | 0 refills | Status: DC

## 2021-05-03 MED ORDER — ALBUTEROL SULFATE HFA 108 (90 BASE) MCG/ACT IN AERS: 2.0000 | INHALATION_SPRAY | Freq: Four times a day (QID) | RESPIRATORY_TRACT | 0 refills | Status: DC | PRN

## 2021-05-03 MED ORDER — METOPROLOL SUCCINATE ER 25 MG PO TB24: 125.0000 mg | ORAL_TABLET | Freq: Every day | ORAL | 0 refills | Status: DC

## 2021-05-03 MED ORDER — LAGEVRIO 200 MG PO CAPS: 4.0000 | ORAL_CAPSULE | Freq: Two times a day (BID) | ORAL | 0 refills | Status: AC

## 2021-05-03 MED ORDER — POTASSIUM CHLORIDE ER 20 MEQ PO TBCR: 20.0000 meq | EXTENDED_RELEASE_TABLET | Freq: Every day | ORAL | 0 refills | Status: DC

## 2021-05-03 MED ORDER — GABAPENTIN 600 MG PO TABS: 1200.0000 mg | ORAL_TABLET | Freq: Three times a day (TID) | ORAL | 0 refills | Status: DC

## 2021-05-03 MED ORDER — LINACLOTIDE 72 MCG PO CAPS: 72.0000 ug | ORAL_CAPSULE | Freq: Every day | ORAL | 0 refills | Status: DC

## 2021-05-03 MED ORDER — PREDNISONE 5 MG PO TABS: 5.0000 mg | ORAL_TABLET | Freq: Every morning | ORAL | 0 refills | Status: DC

## 2021-05-03 MED ORDER — ATORVASTATIN CALCIUM 20 MG PO TABS: 10.0000 mg | ORAL_TABLET | Freq: Every evening | ORAL | 0 refills | Status: DC

## 2021-05-03 MED ORDER — HYDROXYCHLOROQUINE SULFATE 200 MG PO TABS: 200.0000 mg | ORAL_TABLET | Freq: Every day | ORAL | 0 refills | Status: DC

## 2021-05-03 MED ORDER — COLCHICINE 0.6 MG PO TABS
0.6000 mg | ORAL_TABLET | Freq: Every day | ORAL | 0 refills | Status: DC | PRN
Start: 2021-05-03 — End: 2023-07-20

## 2021-05-03 MED ORDER — OZEMPIC (0.25 OR 0.5 MG/DOSE) 2 MG/1.5ML ~~LOC~~ SOPN
0.2500 mg | PEN_INJECTOR | SUBCUTANEOUS | 0 refills | Status: DC
Start: 2021-05-03 — End: 2021-05-31

## 2021-05-03 MED ORDER — LEFLUNOMIDE 20 MG PO TABS: 20.0000 mg | ORAL_TABLET | Freq: Every day | ORAL | 0 refills | Status: DC

## 2021-05-03 MED ORDER — FUROSEMIDE 80 MG PO TABS: 80.0000 mg | ORAL_TABLET | Freq: Every day | ORAL | 0 refills | Status: DC

## 2021-05-03 MED ORDER — ALBUTEROL SULFATE (2.5 MG/3ML) 0.083% IN NEBU: 2.5000 mg | INHALATION_SOLUTION | Freq: Four times a day (QID) | RESPIRATORY_TRACT | 0 refills | Status: DC | PRN

## 2021-05-03 MED ORDER — ASPIRIN 325 MG PO TABS
325.0000 mg | ORAL_TABLET | Freq: Every day | ORAL | 0 refills | Status: DC
Start: 2021-05-03 — End: 2021-10-30

## 2021-05-03 MED ORDER — VITAMIN D-3 25 MCG (1000 UT) PO CAPS: 2000.0000 [IU] | ORAL_CAPSULE | Freq: Every day | ORAL | 0 refills | Status: DC

## 2021-05-03 MED ORDER — PANTOPRAZOLE SODIUM 40 MG PO TBEC: DELAYED_RELEASE_TABLET | ORAL | 0 refills | Status: DC

## 2021-05-03 MED ORDER — CYCLOBENZAPRINE HCL 5 MG PO TABS: 5.0000 mg | ORAL_TABLET | Freq: Every evening | ORAL | 0 refills | Status: DC | PRN

## 2021-05-03 MED ORDER — DICLOFENAC SODIUM 1 % EX GEL: 2.0000 g | Freq: Three times a day (TID) | CUTANEOUS | 1 refills | Status: DC

## 2021-05-03 NOTE — Progress Notes (Addendum)
Location:  [facility] Room Number: [place] of Service:  SNF (31) Provider:  Durenda Age, DNP, FNP-BC  Patient Care Team: [name], MD as PCP - General (Family Medicine) Lorretta Harp, MD as PCP - Cardiology (Cardiology) Duffy, Creola Corn, LCSW as Social Worker (Licensed Clinical Social Worker) Kidney, Nelida Meuse, [place], MD as Consulting Physician (Rheumatology) Efraim Kaufmann, NP (Rheumatology) Theodosia Blender, MD as Referring Physician (Pulmonary Disease)  Extended Emergency Contact Information Primary Emergency Contact: Farrow,Curtis Address: [address]          [place], [place] [address] [place] of [place] Phone: [phone] Mobile Phone: [phone] Relation: Son Secondary Emergency Contact: Fonnie Jarvis Address: [address], [place] [address] [place] Phone: [phone] Mobile Phone: [phone] Relation: Daughter  Code Status:  DNR  Goals of care: Advanced Directive information Advanced Directives 05/02/2021  Does Patient Have a Medical Advance Directive? Yes  Type of Advance Directive Out of facility DNR (pink MOST or yellow form)  Does patient want to make changes to medical advance directive? No - Patient declined  Copy of [place] in Chart? -  Would patient like information on creating a medical advance directive? -  Pre-existing out of facility DNR order (yellow form or pink MOST form) Pink MOST form placed in chart (order not valid for inpatient use)     Chief Complaint  Patient presents with   Acute Visit    Possible discharge home   05/08/21 Addendum: She was set to discharge home on 05/03/21 but had COVID-19 infection on 05/02/20 so she was not discharged home.  HPI:  Pt is a 80 y.o. female who was supposed to be for discharge home on 05/03/21 with Home health PT and OT.  She was admitted to [facility]  on 04/03/2021 post hospital admission 03/30/2021 to 04/03/2021.  She has a PMH of ILD on 2 L O2 at night, diabetes mellitus type 2 with neuropathy, chronic back pains/spinal stenosis with radiculopathy, diastolic CHF, chronic kidney disease is stage III, hypertension, hypothyroidism, RA on prednisone, anxiety and depression.  She was brought to the hospital via EMS due to increased confusion and poor p.o. intake for 1 day.  Her morphine dosage was recently increased by pain management and has been sleepier and not eating well.  In the ED, chest x-ray was consistent with underlying ILD, unchanged from baseline.  Labs showed WBC 9.5, lactic acid normal, creatinine 2.2 (baseline 0.7-0.8), K3.1, Mg 1.6, BMP and ammonia within normal.  Urinalysis showed large leukocytes, small hemoglobin and rare bacteria.  She was started on IVF and IV ceftriaxone for possible sepsis due to possible UTI.  Blood cultures were negative and urine culture with Klebsiella pneumonia.  She was continued on ceftriaxone for 3 days then transitioned to p.o. Keflex for 2 more days for Klebsiella pneumonia UTI.  IV Lasix 60 mg was given for chest tightness, dyspnea and edema from IV fluid resuscitation and holding diuretics in the setting of AKI.[name]  Symptoms resolved and now restarted on oral Lasix.  Pain medication was cut back, MS IR from 3 times daily as needed to twice daily as needed for breakthrough pain.  Patient was admitted to this facility for short-term rehabilitation after the patient's recent hospitalization.  Patient has completed SNF rehabilitation and therapy has cleared the patient for discharge.   Past Medical History:  Diagnosis Date  Acute on chronic diastolic (congestive) heart failure (HCC)    Anemia    iron deficiency anemia - secondary to blood loss ( chronic)    Anxiety    Arthritis    endstage changes bilateral knees/bilateral ankles.    Asthma    Carotid artery occlusion    Chronic fatigue    Chronic  kidney disease    Closed left hip fracture (HCC)    Clotting disorder ([place])    pt denies this   Contusion of left knee    due to fall 1/14.   COPD (chronic obstructive pulmonary disease) (HCC)    pulmonary fibrosis   Depression, reactive    Diabetes mellitus    type II    Diastolic dysfunction    Difficulty in walking    Family history of heart disease    Generalized muscle weakness    Gout    High cholesterol    History of falling    Hypertension    Hypothyroidism    Interstitial lung disease ([place])    Meningioma of left sphenoid wing involving cavernous sinus ([place]) 02/17/2012   Continue diplopia, left eye pain and left headaches.     Morbid obesity ([place])    Neuromuscular disorder ([place])    diabetic neuropathy    Normal coronary arteries    cardiac catheterization performed  10/31/14   Pneumonia    RA (rheumatoid arthritis) ([place])    has been off methotreaxte since 10/13.   Spinal stenosis of lumbar region    Thyroid disease    Unspecified lack of coordination    URI (upper respiratory infection)    Past Surgical History:  Procedure Laterality Date   ABDOMINAL HYSTERECTOMY     BRAIN SURGERY     Gamma knife 10/13. Needs repeat spring  '14   CARDIAC CATHETERIZATION N/A 10/31/2014   Procedure: Right/Left Heart Cath and Coronary Angiography;  Surgeon: [name], MD;  Location: [facility];  Service: Cardiovascular;  Laterality: N/A;   ESOPHAGOGASTRODUODENOSCOPY (EGD) WITH PROPOFOL N/A 09/14/2014   Procedure: ESOPHAGOGASTRODUODENOSCOPY (EGD) WITH PROPOFOL;  Surgeon: [name], MD;  Location: [facility];  Service: Endoscopy;  Laterality: N/A;   INCISION AND DRAINAGE HIP Left 01/16/2017   Procedure: IRRIGATION AND DEBRIDEMENT LEFT HIP;  Surgeon: [name], MD;  Location: [facility];  Service: Orthopedics;  Laterality: Left;   INTRAMEDULLARY (IM) NAIL INTERTROCHANTERIC Left 11/29/2016   Procedure: INTRAMEDULLARY (IM) NAIL INTERTROCHANTRIC;  Surgeon:  [name], MD;  Location: [facility];  Service: Orthopedics;  Laterality: Left;   OVARY SURGERY     SHOULDER SURGERY Left    TONSILLECTOMY  age 83   VIDEO BRONCHOSCOPY Bilateral 05/31/2013   Procedure: VIDEO BRONCHOSCOPY WITHOUT FLUORO;  Surgeon: Brand Males, MD;  Location: [facility];  Service: Cardiopulmonary;  Laterality: Bilateral;   video bronscoscopy  2000   lung    Allergies  Allergen Reactions   Codeine Swelling and Other (See Comments)    Facial swelling Chest pain and swelling in legs  Facial swelling   Infliximab Anaphylaxis    "sent me into shock" "sent me into shock"   Lisinopril Swelling    Face and neck swelling Face and neck swelling    Outpatient Encounter Medications as of 05/01/2021  Medication Sig   acetaminophen (TYLENOL) 500 MG tablet Take 1,000 mg by mouth every 6 (six) hours as needed for moderate pain.    Artificial Tear Ointment (DRY EYES OP) Apply 1 drop to eye daily  as needed (dry eyes).   Calcium Carb-Cholecalciferol (CALCIUM + D3 PO) Take 2 tablets by mouth daily with lunch.    diclofenac Sodium (VOLTAREN) 1 % GEL Apply 2 g topically 3 (three) times daily. Apply to both hands and knees   fluticasone (FLONASE) 50 MCG/ACT nasal spray Place 2 sprays into both nostrils daily.   Multiple Vitamin (MULTIVITAMIN WITH MINERALS) TABS Take 1 tablet by mouth daily.    OXYGEN-HELIUM IN Inhale 3 L into the lungs at bedtime. And on exertion   sodium chloride (OCEAN) 0.65 % SOLN nasal spray Place 1 spray into both nostrils as needed for congestion.   [DISCONTINUED] aspirin 325 MG tablet Take 325 mg by mouth daily.   [DISCONTINUED] cholecalciferol (VITAMIN D) 1000 UNITS tablet Take 1,000 Units by mouth daily with lunch.   [DISCONTINUED] diclofenac sodium (VOLTAREN) 1 % GEL Apply 2 g topically 3 (three) times daily. Both hands and knees   [DISCONTINUED] guaiFENesin (MUCINEX) 600 MG 12 hr tablet Take 1,200 mg by mouth 2 (two) times daily.    [DISCONTINUED] morphine (MS CONTIN) 15 MG 12 hr tablet Take 1 tablet (15 mg total) by mouth every 12 (twelve) hours.   [DISCONTINUED] morphine (MSIR) 15 MG tablet One tablet twice a day as needed for break through pain.   [DISCONTINUED] Potassium Chloride ER 20 MEQ TBCR Take 20 mEq by mouth daily.   albuterol (PROVENTIL) (2.5 MG/3ML) 0.083% nebulizer solution Take 3 mLs (2.5 mg total) by nebulization every 6 (six) hours as needed for wheezing.   albuterol (VENTOLIN HFA) 108 (90 Base) MCG/ACT inhaler Inhale 2 puffs into the lungs every 6 (six) hours as needed for wheezing or shortness of breath.   allopurinol (ZYLOPRIM) 300 MG tablet Take 1 tablet (300 mg total) by mouth daily.   aspirin 325 MG tablet Take 1 tablet (325 mg total) by mouth daily.   atorvastatin (LIPITOR) 20 MG tablet Take 0.5 tablets (10 mg total) by mouth every evening.   colchicine 0.6 MG tablet Take 1 tablet (0.6 mg total) by mouth daily as needed (gout flares).   cyclobenzaprine (FLEXERIL) 5 MG tablet Take 1 tablet (5 mg total) by mouth at bedtime as needed for muscle spasms.   DULoxetine (CYMBALTA) 60 MG capsule Take 1 capsule (60 mg total) by mouth daily.   furosemide (LASIX) 80 MG tablet Take 1 tablet (80 mg total) by mouth daily.   gabapentin (NEURONTIN) 600 MG tablet Take 2 tablets (1,200 mg total) by mouth 3 (three) times daily.   guaiFENesin (MUCINEX) 600 MG 12 hr tablet Take 2 tablets (1,200 mg total) by mouth 2 (two) times daily.   hydroxychloroquine (PLAQUENIL) 200 MG tablet Take 1 tablet (200 mg total) by mouth daily.   leflunomide (ARAVA) 20 MG tablet Take 1 tablet (20 mg total) by mouth daily.   levothyroxine (SYNTHROID) 50 MCG tablet Take 1 tablet (50 mcg total) by mouth daily.   linaclotide (LINZESS) 72 MCG capsule Take 1 capsule (72 mcg total) by mouth daily before breakfast.   metolazone (ZAROXOLYN) 2.5 MG tablet TAKE 1 TABLET BY MOUTH DAILY AS NEEDED FOR 2 DAYS FOR WEIGHT ABOVE 255 POUNDS   metoprolol succinate  (TOPROL-XL) 25 MG 24 hr tablet Take 5 tablets (125 mg total) by mouth daily.   olmesartan (BENICAR) 40 MG tablet Take 1 tablet (40 mg total) by mouth daily.   pantoprazole (PROTONIX) 40 MG tablet take 1 tablet by mouth once daily MINUTES BEFORE 1ST MEAL OF THE DAY   Potassium Chloride  ER 20 MEQ TBCR Take 20 mEq by mouth daily.   predniSONE (DELTASONE) 5 MG tablet Take 1 tablet (5 mg total) by mouth every morning.   Semaglutide,0.25 or 0.5MG/DOS, (OZEMPIC, 0.25 OR 0.5 MG/DOSE,) 2 MG/1.5ML SOPN Inject 0.25 mg as directed once a week.   [DISCONTINUED] albuterol (PROVENTIL HFA;VENTOLIN HFA) 108 (90 BASE) MCG/ACT inhaler Inhale 2 puffs into the lungs every 6 (six) hours as needed for wheezing or shortness of breath.   [DISCONTINUED] albuterol (PROVENTIL) (2.5 MG/3ML) 0.083% nebulizer solution Take 1 ampule by nebulization every 6 (six) hours as needed for wheezing.   [DISCONTINUED] allopurinol (ZYLOPRIM) 300 MG tablet Take 300 mg by mouth daily.   [DISCONTINUED] atorvastatin (LIPITOR) 20 MG tablet Take 0.5 tablets (10 mg total) by mouth every evening.   [DISCONTINUED] colchicine 0.6 MG tablet Take 0.6 mg by mouth daily as needed (gout flares).   [DISCONTINUED] cyclobenzaprine (FLEXERIL) 5 MG tablet Take 1 tablet (5 mg total) by mouth at bedtime as needed for muscle spasms.   [DISCONTINUED] DULoxetine (CYMBALTA) 60 MG capsule Take 1 capsule (60 mg total) by mouth daily.   [DISCONTINUED] furosemide (LASIX) 80 MG tablet Take 1 tablet (80 mg total) by mouth daily.   [DISCONTINUED] gabapentin (NEURONTIN) 600 MG tablet Take 1 tablet (600 mg total) by mouth 3 (three) times daily. (Patient taking differently: Take 1,200 mg by mouth 3 (three) times daily.)   [DISCONTINUED] hydroxychloroquine (PLAQUENIL) 200 MG tablet Take 200 mg by mouth daily.   [DISCONTINUED] leflunomide (ARAVA) 20 MG tablet Take 1 tablet (20 mg total) by mouth daily.   [DISCONTINUED] levothyroxine (SYNTHROID) 50 MCG tablet Take 1 tablet (50  mcg total) by mouth daily.   [DISCONTINUED] linaclotide (LINZESS) 72 MCG capsule Take 1 capsule (72 mcg total) by mouth daily before breakfast.   [DISCONTINUED] metolazone (ZAROXOLYN) 2.5 MG tablet TAKE 1 TABLET BY MOUTH DAILY AS NEEDED FOR 2 DAYS FOR WEIGHT ABOVE 255 POUNDS   [DISCONTINUED] metoprolol succinate (TOPROL-XL) 25 MG 24 hr tablet Take 5 tablets (125 mg total) by mouth daily.   [DISCONTINUED] olmesartan (BENICAR) 40 MG tablet Take 1 tablet (40 mg total) by mouth daily.   [DISCONTINUED] pantoprazole (PROTONIX) 40 MG tablet take 1 tablet by mouth once daily MINUTES BEFORE 1ST MEAL OF THE DAY   [DISCONTINUED] predniSONE (DELTASONE) 5 MG tablet Take 5 mg by mouth every morning.   [DISCONTINUED] Semaglutide,0.25 or 0.5MG/DOS, (OZEMPIC, 0.25 OR 0.5 MG/DOSE,) 2 MG/1.5ML SOPN Inject 0.25 mg as directed once a week.   No facility-administered encounter medications on file as of 05/01/2021.    Review of Systems  GENERAL: No change in appetite, no weight changes, no fever,or chills  MOUTH and THROAT: Denies oral discomfort, gingival pain or bleeding, RESPIRATORY: no cough, SOB, DOE, wheezing, hemoptysis CARDIAC: No chest pain or palpitations GI: No abdominal pain, diarrhea, constipation, heart burn, nausea or vomiting GU: Denies dysuria, frequency, hematuria or discharge NEUROLOGICAL: Denies dizziness, syncope, numbness, or headache PSYCHIATRIC: Denies feelings of depression or anxiety. No report of hallucinations, insomnia, paranoia, or agitation   Immunization History  Administered Date(s) Administered   Fluad Quad(high Dose 65+) 02/02/2019, 03/11/2021   Influenza Split 01/29/2012, 03/30/2013, 02/10/2014   Influenza, High Dose Seasonal PF 02/16/2017, 02/11/2018, 01/31/2019, 02/27/2020   Influenza,inj,Quad PF,6+ Mos 02/09/2015, 12/18/2015   Influenza-Unspecified 02/09/2015, 12/18/2015   PFIZER Comirnaty(Gray Top)Covid-19 Tri-Sucrose Vaccine 07/24/2019, 08/14/2019, 02/27/2020    PFIZER(Purple Top)SARS-COV-2 Vaccination 07/17/2019, 08/14/2019   PPD Test 04/23/2015   Pneumococcal Conjugate-13 08/21/2014   Pneumococcal Polysaccharide-23 02/17/2012  Tdap 12/27/2015   Pertinent  Health Maintenance Due  Topic Date Due   FOOT EXAM  05/23/2020   OPHTHALMOLOGY EXAM  03/15/2021   HEMOGLOBIN A1C  09/30/2021   INFLUENZA VACCINE  Completed   DEXA SCAN  Completed   Fall Risk 04/01/2021 04/01/2021 04/02/2021 04/03/2021 04/03/2021  Falls in the past year? - - - - -  Number of falls in past year - - - - -  Was there an injury with Fall? - - - - -  Was there an injury with Fall? - - - - -  Fall Risk Category Calculator - - - - -  Fall Risk Category - - - - -  Patient Fall Risk Level _0   Patient at Risk for Falls Due to - - - - -  Patient at Risk for Falls Due to - - - - -  Fall risk Follow up - - - - -  Fall risk Follow up - - - - -     Vitals:   05/01/21 1146  BP: 137/65  Pulse: 78  Resp: 18  Temp: 97.7 F (36.5 C)  Weight: 228 lb 6.4 oz (103.6 kg)  Height: _1  (1.626 m)   Body mass index is 39.2 kg/m.  Physical Exam  GENERAL APPEARANCE: Well nourished. In no acute distress.  Morbidly obese.   SKIN:  Skin is warm and dry.  MOUTH and THROAT: Lips are without lesions. Oral mucosa is moist and without lesions.  RESPIRATORY: Breathing is even & unlabored, BS CTAB CARDIAC: RRR, no murmur,no extra heart sounds, BLE 2+edema GI: Abdomen soft, normal BS, no masses, no tenderness NEUROLOGICAL: There is no tremor. Speech is clear. Alert and oriented X 3. PSYCHIATRIC:  Affect and behavior are appropriate  Labs reviewed: Recent Labs    03/31/21 0639 04/01/21 0041 04/02/21 0056  NA 141 140 139  K 3.6 4.8 4.1  CL 104 103 99  CO2 29 28 32  GLUCOSE 119* 139* 157*  BUN 9 7* 7*  CREATININE 0.88 0.67 0.79  CALCIUM 7.8* 8.1* 8.2*  MG 1.8 1.9 1.9  PHOS  --  1.9* 2.9   Recent Labs     01/19/21 0211 01/24/21 1156 03/30/21 1200 04/01/21 0041 04/02/21 0056  AST 14* 23 20  --   --   ALT 23 41* 22  --   --   ALKPHOS 60 72 74  --   --   BILITOT 0.6 0.4 0.8  --   --   PROT 4.7* 5.8* 5.4*  --   --   ALBUMIN 2.3* 3.4* 3.0* 2.5* 2.6*   Recent Labs    01/24/21 1156 01/30/21 1444 03/30/21 1200 03/30/21 1248 03/31/21 0639 04/01/21 0041 04/02/21 0056  WBC 6.9 7.1 9.5  --  7.3 7.4 5.4  NEUTROABS 5.3 4.8 5.4  --   --   --   --   HGB 11.8* 12.3 12.3   < > 11.2* 11.1* 11.0*  HCT 37.7 39.2 40.4   < > 36.5 36.3 36.4  MCV 95.3 95.7 102.3*  --  99.7 99.7 98.4  PLT 197.0 212.0 119*  --  98* 106* 126*   < > = values in this interval not displayed.   Lab Results  Component Value Date   TSH 4.598 (H) 01/16/2021   Lab Results  Component Value Date   HGBA1C 6.5 (H) 04/02/2021   Lab Results  Component Value Date   CHOL 136 09/26/2020   HDL 40.00 09/26/2020   LDLCALC 75 09/22/2019   LDLDIRECT 59.0 09/26/2020   TRIG 283.0 (H) 09/26/2020   CHOLHDL 3 09/26/2020    Significant Diagnostic Results in last 30 days:  No results found.  Assessment/Plan  1. Acute encephalopathy -   Thought to be due to polypharmacy in the setting of renal failure and UTI -   MS Contin IR was decreased from 3 times daily PRN to BID -    Was started on IV ceftriaxone x3 days then transition to oral Keflex x2 more days, completed -    Resolved  2. Acute cystitis without hematuria -    Was treated with IV ceftriaxone x3 days then transitioned to oral Keflex x2 more days, completed -    Resolved  3. Acute kidney injury superimposed on chronic kidney disease ([place]) -    IVF was given Lab Results  Component Value Date   NA 139 04/02/2021   K 4.1 04/02/2021   CO2 32 04/02/2021   GLUCOSE 157 (H) 04/02/2021   BUN 7 (L) 04/02/2021   CREATININE 0.79 04/02/2021   CALCIUM 8.2 (L) 04/02/2021   GFRNONAA >60 04/02/2021   -   Resolved  4. Rheumatoid arthritis involving both hands with positive  rheumatoid factor (HCC) - hydroxychloroquine (PLAQUENIL) 200 MG tablet; Take 1 tablet (200 mg total) by mouth daily.  Dispense: 30 tablet; Refill: 0 - leflunomide (ARAVA) 20 MG tablet; Take 1 tablet (20 mg total) by mouth daily.  Dispense: 30 tablet; Refill: 0 - aspirin 325 MG tablet; Take 1 tablet (325 mg total) by mouth daily.  Dispense: 30 tablet; Refill: 0  5. ILD (interstitial lung disease) (HCC) - albuterol (VENTOLIN HFA) 108 (90 Base) MCG/ACT inhaler; Inhale 2 puffs into the lungs every 6 (six) hours as needed for wheezing or shortness of breath.  Dispense: 6.7 g; Refill: 0 - albuterol (PROVENTIL) (2.5 MG/3ML) 0.083% nebulizer solution; Take 3 mLs (2.5 mg total) by nebulization every 6 (six) hours as needed for wheezing.  Dispense: 75 mL; Refill: 0 - predniSONE (DELTASONE) 5 MG tablet; Take 1 tablet (5 mg total) by mouth every morning.  Dispense: 30 tablet; Refill: 0 - guaiFENesin (MUCINEX) 600 MG 12 hr tablet; Take 2 tablets (1,200 mg total) by mouth 2 (two) times daily.  Dispense: 120 tablet; Refill: 0 -   Followed by palliative care  6. 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  Dispense: 1.5 mL; Refill: 0  7. Chronic diastolic CHF (congestive heart failure) (HCC) - furosemide (LASIX) 80 MG tablet; Take 1 tablet (80 mg total) by mouth daily.  Dispense: 30 tablet; Refill: 0 - metolazone (ZAROXOLYN) 2.5 MG tablet; TAKE 1 TABLET BY MOUTH DAILY AS NEEDED FOR 2 DAYS FOR WEIGHT ABOVE 255 POUNDS  Dispense: 30 tablet; Refill: 0 - Potassium Chloride ER 20 MEQ TBCR; Take 20 mEq by mouth daily.  Dispense: 30 tablet; Refill: 0  8. Essential hypertension - metoprolol succinate (TOPROL-XL) 25 MG 24 hr tablet; Take 5 tablets (125 mg total) by mouth daily.  Dispense: 150 tablet; Refill: 0 - olmesartan (BENICAR) 40 MG tablet; Take 1 tablet (40 mg total) by mouth daily.   Dispense: 30 tablet; Refill: 0  9. Chronic gout of multiple sites, unspecified cause - allopurinol (ZYLOPRIM) 300 MG tablet; Take 1 tablet (300 mg total) by  mouth daily.  Dispense: 30 tablet; Refill: 0 - colchicine 0.6 MG tablet; Take 1 tablet (0.6 mg total) by mouth daily as needed (gout flares).  Dispense: 30 tablet; Refill: 0  10. Mixed hyperlipidemia - atorvastatin (LIPITOR) 20 MG tablet; Take 0.5 tablets (10 mg total) by mouth every evening.  Dispense: 15 tablet; Refill: 0  11. Chronic bilateral low back pain without sciatica - cyclobenzaprine (FLEXERIL) 5 MG tablet; Take 1 tablet (5 mg total) by mouth at bedtime as needed for muscle spasms.  Dispense: 30 tablet; Refill: 0 - DULoxetine (CYMBALTA) 60 MG capsule; Take 1 capsule (60 mg total) by mouth daily.  Dispense: 30 capsule; Refill: 0 - gabapentin (NEURONTIN) 600 MG tablet; Take 2 tablets (1,200 mg total) by mouth 3 (three) times daily.  Dispense: 180 tablet; Refill: 0 - diclofenac Sodium (VOLTAREN) 1 % GEL; Apply 2 g topically 3 (three) times daily. Apply to both hands and knees  Dispense: 100 g; Refill: 1 -  follows up with pain clinic -    Continue morphine sulfate ER 15 mg 1 tab twice a day and morphine sulfate IR 15 mg twice a day PRN  12. Acquired hypothyroidism - levothyroxine (SYNTHROID) 50 MCG tablet; Take 1 tablet (50 mcg total) by mouth daily.  Dispense: 30 tablet; Refill: 0  13. Chronic constipation - linaclotide (LINZESS) 72 MCG capsule; Take 1 capsule (72 mcg total) by mouth daily before breakfast.  Dispense: 30 capsule; Refill: 0  14. Gastroesophageal reflux disease without esophagitis - pantoprazole (PROTONIX) 40 MG tablet; take 1 tablet by mouth once daily MINUTES BEFORE 1ST MEAL OF THE DAY  Dispense: 30 tablet; Refill: 0     I have filled out patient's discharge paperwork and e-prescribed medications.  Patient will have home health PT and OT.  DME provided:  wheelchair  Wheelchair  -patient suffers from  rheumatoid arthritis which impairs her ability to perform daily activities like toileting, feeding, dressing, grooming and bathing in the home.  A cane or walker will not resolve the issue with performing activities of daily living.  A wheelchair will allow patient to safely perform daily activities.  Patient can safely propel the wheelchair in the home.  Total discharge time: Greater than 30 minutes Greater than 50% was spent in counseling and coordination of care.   Discharge time involved coordination of the discharge process with social worker, nursing staff and therapy department. Medical justification for home health services/DME verified.   Durenda Age, DNP, MSN, FNP-BC [facility] [phone] (Monday-Friday 8:00 a.m. - 5:00 p.m.) [phone] (after hours)

## 2021-05-04 ENCOUNTER — Other Ambulatory Visit: Payer: Self-pay | Admitting: Adult Health

## 2021-05-04 MED ORDER — MORPHINE SULFATE 15 MG PO TABS: ORAL_TABLET | ORAL | 0 refills | Status: DC

## 2021-05-04 MED ORDER — MORPHINE SULFATE ER 15 MG PO TBCR: 15.0000 mg | EXTENDED_RELEASE_TABLET | Freq: Two times a day (BID) | ORAL | 0 refills | Status: DC

## 2021-05-06 ENCOUNTER — Inpatient Hospital Stay: Payer: Medicare Other | Admitting: Family Medicine

## 2021-05-06 DIAGNOSIS — G9340 Encephalopathy, unspecified: Secondary | ICD-10-CM | POA: Diagnosis not present

## 2021-05-06 DIAGNOSIS — M6281 Muscle weakness (generalized): Secondary | ICD-10-CM | POA: Diagnosis not present

## 2021-05-06 DIAGNOSIS — Z741 Need for assistance with personal care: Secondary | ICD-10-CM | POA: Diagnosis not present

## 2021-05-06 DIAGNOSIS — R2681 Unsteadiness on feet: Secondary | ICD-10-CM | POA: Diagnosis not present

## 2021-05-06 DIAGNOSIS — R1312 Dysphagia, oropharyngeal phase: Secondary | ICD-10-CM | POA: Diagnosis not present

## 2021-05-06 DIAGNOSIS — M069 Rheumatoid arthritis, unspecified: Secondary | ICD-10-CM | POA: Diagnosis not present

## 2021-05-08 ENCOUNTER — Ambulatory Visit: Payer: Medicare Other | Admitting: Family Medicine

## 2021-05-08 ENCOUNTER — Non-Acute Institutional Stay (SKILLED_NURSING_FACILITY): Payer: Medicare Other | Admitting: Adult Health

## 2021-05-08 ENCOUNTER — Encounter: Payer: Self-pay | Admitting: Adult Health

## 2021-05-08 DIAGNOSIS — J849 Interstitial pulmonary disease, unspecified: Secondary | ICD-10-CM

## 2021-05-08 DIAGNOSIS — E1140 Type 2 diabetes mellitus with diabetic neuropathy, unspecified: Secondary | ICD-10-CM

## 2021-05-08 DIAGNOSIS — M05741 Rheumatoid arthritis with rheumatoid factor of right hand without organ or systems involvement: Secondary | ICD-10-CM

## 2021-05-08 DIAGNOSIS — N179 Acute kidney failure, unspecified: Secondary | ICD-10-CM

## 2021-05-08 DIAGNOSIS — I5032 Chronic diastolic (congestive) heart failure: Secondary | ICD-10-CM | POA: Diagnosis not present

## 2021-05-08 DIAGNOSIS — E782 Mixed hyperlipidemia: Secondary | ICD-10-CM | POA: Diagnosis not present

## 2021-05-08 DIAGNOSIS — M545 Low back pain, unspecified: Secondary | ICD-10-CM

## 2021-05-08 DIAGNOSIS — M1A09X0 Idiopathic chronic gout, multiple sites, without tophus (tophi): Secondary | ICD-10-CM

## 2021-05-08 DIAGNOSIS — N3000 Acute cystitis without hematuria: Secondary | ICD-10-CM

## 2021-05-08 DIAGNOSIS — M069 Rheumatoid arthritis, unspecified: Secondary | ICD-10-CM | POA: Diagnosis not present

## 2021-05-08 DIAGNOSIS — K5909 Other constipation: Secondary | ICD-10-CM

## 2021-05-08 DIAGNOSIS — Z741 Need for assistance with personal care: Secondary | ICD-10-CM | POA: Diagnosis not present

## 2021-05-08 DIAGNOSIS — E039 Hypothyroidism, unspecified: Secondary | ICD-10-CM

## 2021-05-08 DIAGNOSIS — K219 Gastro-esophageal reflux disease without esophagitis: Secondary | ICD-10-CM

## 2021-05-08 DIAGNOSIS — N189 Chronic kidney disease, unspecified: Secondary | ICD-10-CM

## 2021-05-08 DIAGNOSIS — U071 COVID-19: Secondary | ICD-10-CM | POA: Diagnosis not present

## 2021-05-08 DIAGNOSIS — M05742 Rheumatoid arthritis with rheumatoid factor of left hand without organ or systems involvement: Secondary | ICD-10-CM

## 2021-05-08 DIAGNOSIS — G8929 Other chronic pain: Secondary | ICD-10-CM

## 2021-05-08 DIAGNOSIS — R2681 Unsteadiness on feet: Secondary | ICD-10-CM | POA: Diagnosis not present

## 2021-05-08 DIAGNOSIS — R1312 Dysphagia, oropharyngeal phase: Secondary | ICD-10-CM | POA: Diagnosis not present

## 2021-05-08 DIAGNOSIS — I10 Essential (primary) hypertension: Secondary | ICD-10-CM

## 2021-05-08 DIAGNOSIS — G9340 Encephalopathy, unspecified: Secondary | ICD-10-CM | POA: Diagnosis not present

## 2021-05-08 DIAGNOSIS — M6281 Muscle weakness (generalized): Secondary | ICD-10-CM | POA: Diagnosis not present

## 2021-05-08 NOTE — Addendum Note (Signed)
Addended by: Durenda Age C on: 05/08/2021 01:32 PM   Modules accepted: Level of Service

## 2021-05-08 NOTE — Progress Notes (Signed)
Location:  [facility] Room Number: [place] of Service:  SNF (31) Provider:  Durenda Age, DNP, FNP-BC  Patient Care Team: [name], MD as PCP - General (Family Medicine) Lorretta Harp, MD as PCP - Cardiology (Cardiology) Duffy, Creola Corn, LCSW as Social Worker (Licensed Clinical Social Worker) Kidney, Nelida Meuse, Blanca, MD as Consulting Physician (Rheumatology) Efraim Kaufmann, NP (Rheumatology) Theodosia Blender, MD as Referring Physician (Pulmonary Disease)  Extended Emergency Contact Information Primary Emergency Contact: Farrow,Curtis Address: [address]          [place], [place] [address] Johnnette Litter of [place] Phone: [phone] Mobile Phone: [phone] Relation: Son Secondary Emergency Contact: Fonnie Jarvis Address: [address], [place] [address] [place] of [place] Phone: [phone] Mobile Phone: [phone] Relation: Daughter  Code Status:  DNR  Goals of care: Advanced Directive information Advanced Directives 05/02/2021  Does Patient Have a Medical Advance Directive? Yes  Type of Advance Directive Out of facility DNR (pink MOST or yellow form)  Does patient want to make changes to medical advance directive? No - Patient declined  Copy of [place] in Chart? -  Would patient like information on creating a medical advance directive? -  Pre-existing out of facility DNR order (yellow form or pink MOST form) Pink MOST form placed in chart (order not valid for inpatient use)     Chief Complaint  Patient presents with   Discharge Note    For discharge home today, 05/08/21    HPI:  Pt is a 80 y.o. female who is for discharge home on 05/03/2021 with home health PT and OT.  She was admitted to [facility] on 11/16/202 post hospital admission 03/30/21 to  04/03/21. She has a PMH of ILD on 2L O2 at night, diabetes mellitus type 2 with  neuropathy, chronic back pains/spinal stenosis with radiculopathy, diastolic CHF, chronic kidney disease stage III, hypertension, hypothyroidism, RA on Prednisone, anxiety and depression. She was brought to the hospital via EMS due to increased confusion and poor PO intake for 1 day. Her morphine dosage was recently increased by pain management and has been sleepier and not eating well. In the ED, chest x-ray was consistent with underlying ILD, unchanged from baseline. Labs showed WBC 9.5, lactic acid normal, creatinine 2.2 (baseline 0.7-0.8), K3.1, Mg 1.6, BMP and ammonia within normal.  Urinalysis showed large leukocytes, small hemoglobin and rare bacteria.  She was a started on IVF and IV ceftriaxone for possible sepsis due to possible UTI.  Blood cultures were negative and urine culture with Klebsiella pneumonia.  She was continued on ceftriaxone for 3 days then transitioned to p.o. Keflex for 2 more days for Klebsiella pneumonia UTI.  IV Lasix 60 mg was given for chest tightness, dyspnea and edema from IV fluid resuscitation and holding diuretics in the setting of AKI.  Symptoms resolved and now restarted on oral Lasix.  Pain medication was cut back, MS IR from 3 times daily as needed to twice daily as needed for breakthrough pain.  She was about to be discharged on 05/03/2021 but tested positive for COVID-19 during a routine facility screening.  She completed Molnupiravir 800 mg every 12 hours x5 days. She has 3 COVID-19 vaccines and completed her isolation. Resident decided to stay at [facility] during her isolation and while on [place].  Patient was admitted to this facility for short-term rehabilitation after the patient's recent  hospitalization.  Patient has completed SNF rehabilitation and therapy has cleared the patient for discharge.   Past Medical History:  Diagnosis Date   Acute on chronic diastolic (congestive) heart failure (HCC)    Anemia    iron deficiency anemia - secondary to  blood loss ( chronic)    Anxiety    Arthritis    endstage changes bilateral knees/bilateral ankles.    Asthma    Carotid artery occlusion    Chronic fatigue    Chronic kidney disease    Closed left hip fracture (HCC)    Clotting disorder ([place])    pt denies this   Contusion of left knee    due to fall 1/14.   COPD (chronic obstructive pulmonary disease) (HCC)    pulmonary fibrosis   Depression, reactive    Diabetes mellitus    type II    Diastolic dysfunction    Difficulty in walking    Family history of heart disease    Generalized muscle weakness    Gout    High cholesterol    History of falling    Hypertension    Hypothyroidism    Interstitial lung disease ([place])    Meningioma of left sphenoid wing involving cavernous sinus ([place]) 02/17/2012   Continue diplopia, left eye pain and left headaches.     Morbid obesity ([place])    Neuromuscular disorder ([place])    diabetic neuropathy    Normal coronary arteries    cardiac catheterization performed  10/31/14   Pneumonia    RA (rheumatoid arthritis) ([place])    has been off methotreaxte since 10/13.   Spinal stenosis of lumbar region    Thyroid disease    Unspecified lack of coordination    URI (upper respiratory infection)    Past Surgical History:  Procedure Laterality Date   ABDOMINAL HYSTERECTOMY     BRAIN SURGERY     Gamma knife 10/13. Needs repeat spring  '14   CARDIAC CATHETERIZATION N/A 10/31/2014   Procedure: Right/Left Heart Cath and Coronary Angiography;  Surgeon: [name], MD;  Location: [facility] CV LAB;  Service: Cardiovascular;  Laterality: N/A;   ESOPHAGOGASTRODUODENOSCOPY (EGD) WITH PROPOFOL N/A 09/14/2014   Procedure: ESOPHAGOGASTRODUODENOSCOPY (EGD) WITH PROPOFOL;  Surgeon: [name], MD;  Location: [facility];  Service: Endoscopy;  Laterality: N/A;   INCISION AND DRAINAGE HIP Left 01/16/2017   Procedure: IRRIGATION AND DEBRIDEMENT LEFT HIP;  Surgeon: [name], MD;  Location: [facility]  ORS;  Service: Orthopedics;  Laterality: Left;   INTRAMEDULLARY (IM) NAIL INTERTROCHANTERIC Left 11/29/2016   Procedure: INTRAMEDULLARY (IM) NAIL INTERTROCHANTRIC;  Surgeon: [name], MD;  Location: [facility];  Service: Orthopedics;  Laterality: Left;   OVARY SURGERY     SHOULDER SURGERY Left    TONSILLECTOMY  age 53   VIDEO BRONCHOSCOPY Bilateral 05/31/2013   Procedure: VIDEO BRONCHOSCOPY WITHOUT FLUORO;  Surgeon: Brand Males, MD;  Location: [facility];  Service: Cardiopulmonary;  Laterality: Bilateral;   video bronscoscopy  2000   lung    Allergies  Allergen Reactions   Codeine Swelling and Other (See Comments)    Facial swelling Chest pain and swelling in legs  Facial swelling   Infliximab Anaphylaxis    "sent me into shock" "sent me into shock"   Lisinopril Swelling    Face and neck swelling Face and neck swelling    Outpatient Encounter Medications as of 05/08/2021  Medication Sig   acetaminophen (TYLENOL) 500 MG tablet Take 1,000 mg by  mouth every 6 (six) hours as needed for moderate pain.    albuterol (PROVENTIL) (2.5 MG/3ML) 0.083% nebulizer solution Take 3 mLs (2.5 mg total) by nebulization every 6 (six) hours as needed for wheezing.   albuterol (VENTOLIN HFA) 108 (90 Base) MCG/ACT inhaler Inhale 2 puffs into the lungs every 6 (six) hours as needed for wheezing or shortness of breath.   allopurinol (ZYLOPRIM) 300 MG tablet Take 1 tablet (300 mg total) by mouth daily.   Artificial Tear Ointment (DRY EYES OP) Apply 1 drop to eye daily as needed (dry eyes).   aspirin 325 MG tablet Take 1 tablet (325 mg total) by mouth daily.   atorvastatin (LIPITOR) 20 MG tablet Take 0.5 tablets (10 mg total) by mouth every evening.   Calcium Carb-Cholecalciferol (CALCIUM + D3 PO) Take 2 tablets by mouth daily with lunch.    Cholecalciferol (VITAMIN D-3) 25 MCG (1000 UT) CAPS Take 2 capsules (2,000 Units total) by mouth daily. GIVE ONE TAB PO Q DAY   colchicine 0.6 MG  tablet Take 1 tablet (0.6 mg total) by mouth daily as needed (gout flares).   cyclobenzaprine (FLEXERIL) 5 MG tablet Take 1 tablet (5 mg total) by mouth at bedtime as needed for muscle spasms.   diclofenac Sodium (VOLTAREN) 1 % GEL Apply 2 g topically 3 (three) times daily. Apply to both hands and knees   DULoxetine (CYMBALTA) 60 MG capsule Take 1 capsule (60 mg total) by mouth daily.   fluticasone (FLONASE) 50 MCG/ACT nasal spray Place 2 sprays into both nostrils daily.   furosemide (LASIX) 80 MG tablet Take 1 tablet (80 mg total) by mouth daily.   gabapentin (NEURONTIN) 600 MG tablet Take 2 tablets (1,200 mg total) by mouth 3 (three) times daily.   guaiFENesin (MUCINEX) 600 MG 12 hr tablet Take 2 tablets (1,200 mg total) by mouth 2 (two) times daily.   hydroxychloroquine (PLAQUENIL) 200 MG tablet Take 1 tablet (200 mg total) by mouth daily.   leflunomide (ARAVA) 20 MG tablet Take 1 tablet (20 mg total) by mouth daily.   levothyroxine (SYNTHROID) 50 MCG tablet Take 1 tablet (50 mcg total) by mouth daily.   linaclotide (LINZESS) 72 MCG capsule Take 1 capsule (72 mcg total) by mouth daily before breakfast.   metolazone (ZAROXOLYN) 2.5 MG tablet TAKE 1 TABLET BY MOUTH DAILY AS NEEDED FOR 2 DAYS FOR WEIGHT ABOVE 255 POUNDS   metoprolol succinate (TOPROL-XL) 25 MG 24 hr tablet Take 5 tablets (125 mg total) by mouth daily.   morphine (MS CONTIN) 15 MG 12 hr tablet Take 1 tablet (15 mg total) by mouth every 12 (twelve) hours.   morphine (MSIR) 15 MG tablet One tablet twice a day as needed for break through pain.   Multiple Vitamin (MULTIVITAMIN WITH MINERALS) TABS Take 1 tablet by mouth daily.    olmesartan (BENICAR) 40 MG tablet Take 1 tablet (40 mg total) by mouth daily.   OXYGEN-HELIUM IN Inhale 3 L into the lungs at bedtime. And on exertion   pantoprazole (PROTONIX) 40 MG tablet take 1 tablet by mouth once daily MINUTES BEFORE 1ST MEAL OF THE DAY   Potassium Chloride ER 20 MEQ TBCR Take 20 mEq by  mouth daily.   predniSONE (DELTASONE) 5 MG tablet Take 1 tablet (5 mg total) by mouth every morning.   Semaglutide,0.25 or 0.5MG/DOS, (OZEMPIC, 0.25 OR 0.5 MG/DOSE,) 2 MG/1.5ML SOPN Inject 0.25 mg as directed once a week.   sodium chloride (OCEAN) 0.65 % SOLN nasal  spray Place 1 spray into both nostrils as needed for congestion.   zinc sulfate 220 (50 Zn) MG capsule Take 1 capsule (220 mg total) by mouth daily for 9 days.   No facility-administered encounter medications on file as of 05/08/2021.    Review of Systems  GENERAL: No change in appetite, no fatigue, no weight changes, no fever or chills  MOUTH and THROAT: Denies oral discomfort, gingival pain or bleeding RESPIRATORY: no cough, SOB, DOE, wheezing, hemoptysis CARDIAC: No chest pain, edema or palpitations GI: No abdominal pain, diarrhea, constipation, heart burn, nausea or vomiting GU: Denies dysuria, frequency, hematuria, incontinence, or discharge NEUROLOGICAL: Denies dizziness, syncope, numbness, or headache PSYCHIATRIC: Denies feelings of depression or anxiety. No report of hallucinations, insomnia, paranoia, or agitation   Immunization History  Administered Date(s) Administered   Fluad Quad(high Dose 65+) 02/02/2019, 03/11/2021   Influenza Split 01/29/2012, 03/30/2013, 02/10/2014   Influenza, High Dose Seasonal PF 02/16/2017, 02/11/2018, 01/31/2019, 02/27/2020   Influenza,inj,Quad PF,6+ Mos 02/09/2015, 12/18/2015   Influenza-Unspecified 02/09/2015, 12/18/2015   PFIZER Comirnaty(Gray Top)Covid-19 Tri-Sucrose Vaccine 07/24/2019, 08/14/2019, 02/27/2020   PFIZER(Purple Top)SARS-COV-2 Vaccination 07/17/2019, 08/14/2019   PPD Test 04/23/2015   Pneumococcal Conjugate-13 08/21/2014   Pneumococcal Polysaccharide-23 02/17/2012   Tdap 12/27/2015   Pertinent  Health Maintenance Due  Topic Date Due   FOOT EXAM  05/23/2020   OPHTHALMOLOGY EXAM  03/15/2021   HEMOGLOBIN A1C  09/30/2021   INFLUENZA VACCINE  Completed   DEXA SCAN   Completed   Fall Risk 04/01/2021 04/01/2021 04/02/2021 04/03/2021 04/03/2021  Falls in the past year? - - - - -  Number of falls in past year - - - - -  Was there an injury with Fall? - - - - -  Was there an injury with Fall? - - - - -  Fall Risk Category Calculator - - - - -  Fall Risk Category - - - - -  Patient Fall Risk Level _0   Patient at Risk for Falls Due to - - - - -  Patient at Risk for Falls Due to - - - - -  Fall risk Follow up - - - - -  Fall risk Follow up - - - - -     Vitals:   05/08/21 1000  BP: 137/79  Pulse: 79  Resp: 18  Temp: 97.9 F (36.6 C)  Weight: 229 lb 8 oz (104.1 kg)  Height: 5' 4" (1.626 m)   Body mass index is 39.39 kg/m.  Physical Exam  GENERAL APPEARANCE: Well nourished. In no acute distress.  Morbidly obese SKIN:  Skin is warm and dry.  MOUTH and THROAT: Lips are without lesions. Oral mucosa is moist and without lesions.  RESPIRATORY: Breathing is even & unlabored, BS CTAB CARDIAC: RRR, no murmur,no extra heart sounds, no edema GI: Abdomen soft, normal BS, no masses, no tenderness NEUROLOGICAL: There is no tremor. Speech is clear. Alert and oriented X 3. PSYCHIATRIC:  Affect and behavior are appropriate  Labs reviewed: Recent Labs    03/31/21 0639 04/01/21 0041 04/02/21 0056  NA 141 140 139  K 3.6 4.8 4.1  CL 104 103 99  CO2 29 28 32  GLUCOSE 119* 139* 157*  BUN 9 7* 7*  CREATININE 0.88 0.67 0.79  CALCIUM 7.8* 8.1* 8.2*  MG 1.8 1.9 1.9  PHOS  --  1.9* 2.9   Recent Labs    01/19/21 0211  01/24/21 1156 03/30/21 1200 04/01/21 0041 04/02/21 0056  AST 14* 23 20  --   --   ALT 23 41* 22  --   --   ALKPHOS 60 72 74  --   --   BILITOT 0.6 0.4 0.8  --   --   PROT 4.7* 5.8* 5.4*  --   --   ALBUMIN 2.3* 3.4* 3.0* 2.5* 2.6*   Recent Labs    01/24/21 1156 01/30/21 1444 03/30/21 1200 03/30/21 1248 03/31/21 0639 04/01/21 0041 04/02/21 0056  WBC 6.9  7.1 9.5  --  7.3 7.4 5.4  NEUTROABS 5.3 4.8 5.4  --   --   --   --   HGB 11.8* 12.3 12.3   < > 11.2* 11.1* 11.0*  HCT 37.7 39.2 40.4   < > 36.5 36.3 36.4  MCV 95.3 95.7 102.3*  --  99.7 99.7 98.4  PLT 197.0 212.0 119*  --  98* 106* 126*   < > = values in this interval not displayed.   Lab Results  Component Value Date   TSH 4.598 (H) 01/16/2021   Lab Results  Component Value Date   HGBA1C 6.5 (H) 04/02/2021   Lab Results  Component Value Date   CHOL 136 09/26/2020   HDL 40.00 09/26/2020   LDLCALC 75 09/22/2019   LDLDIRECT 59.0 09/26/2020   TRIG 283.0 (H) 09/26/2020   CHOLHDL 3 09/26/2020    Significant Diagnostic Results in last 30 days:  No results found.  Assessment/Plan  1. COVID-19 -  completed Molnupiravir 800 mg every 12 hours X 5 days -  completed isolation  2. Acute encephalopathy -   Thought to be due to polypharmacy in the setting of renal failure and UTI -    MS Contin IR was decreased from 3 times daily PRN to twice daily PRN -    Was started on IV ceftriaxone x3 days then transitioned to oral Keflex x2 more days, completed -    Resolved  3. Acute kidney injury superimposed on chronic kidney disease [facility]) Lab Results  Component Value Date   NA 139 04/02/2021   K 4.1 04/02/2021   CO2 32 04/02/2021   GLUCOSE 157 (H) 04/02/2021   BUN 7 (L) 04/02/2021   CREATININE 0.79 04/02/2021   CALCIUM 8.2 (L) 04/02/2021   GFRNONAA >60 04/02/2021   -  was given IVF in the hospital  4. Acute cystitis without hematuria -    Was treated with IV ceftriaxone x3 days then transitioned to oral Keflex x2 more days, completed -     Resolved  5. ILD (interstitial lung disease) (HCC) -    Continue albuterol inhaler inhale 2 puffs into the lungs every 6 hours PRN, Proventil 2.5 mg / 3 mL nebulization every 6 hours PRN -    Prednisone 5 mg 1 tab every morning -    Guaifenesin 600 mg 12-hour tab take 2 tabs = 1200 mg twice a day  6. Rheumatoid arthritis involving both  hands with positive rheumatoid factor (HCC) -    Continue hydroxychloroquine 200 mg 1 tab daily, leflunomide 20 mg 1 tab daily and aspirin 325 mg 1 tab daily  7. Chronic diastolic CHF (congestive heart failure) (HCC) -    Continue Lasix 80 mg 1 tab daily, metolazone 2.5 mg 1 tab daily PRN for weight gain above 255 lbs and KCl ER 20 meq 1 tab daily  8. 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) Lab Results  Component Value Date   HGBA1C 6.5 (H) 04/02/2021   -    Continue semaglutide inject 0.25 mg weekly  9. Essential hypertension -    Continue metoprolol succinate 24-hour 25 mg take 5 tablets = 125 mg daily and olmesartan 40 mg 1 tab daily  10. Mixed hyperlipidemia Lab Results  Component Value Date   CHOL 136 09/26/2020   HDL 40.00 09/26/2020   LDLCALC 75 09/22/2019   LDLDIRECT 59.0 09/26/2020   TRIG 283.0 (H) 09/26/2020   CHOLHDL 3 09/26/2020   -    Continue Lipitor 20 mg take 1/2 tab = 10 mg orally in the evening  11. Chronic gout of multiple sites, unspecified cause -    Continue allopurinol 300 mg 1 tab daily and colchicine 0.6 mg 1 tab daily PRN  12. Chronic bilateral low back pain without sciatica -    Continue cyclobenzaprine 5 mg at bedtime PRN for muscle spasm, duloxetine 60 mg 1 capsule daily, gabapentin teen 600 mg take 2 tablets = 1200 mg 3 times a day, diclofenac sodium 1% gel apply 2 g topically to both hands and knees 3 times daily -    Continue morphine sulfate ER 15 mg 1 tab twice a day and morphine sulfate IR 15 mg twice a day PRN  13. Gastroesophageal reflux disease without esophagitis -    Continue Protonix 40 mg 1 tab daily  14. Acquired hypothyroidism Lab Results  Component Value Date   TSH 4.598 (H) 01/16/2021   -    Continue Synthroid 50 mcg 1 tab daily  15. Chronic constipation -    Continue Linzess 72 mcg capsule daily       I have filled out patient's discharge paperwork and e- prescribed medications.  Patient will have home  health PT and OT.  DME provided: Wheelchair  Total discharge time: Greater than 30 minutes Greater than 50% was spent in counseling and coordination of care.   Discharge time involved coordination of the discharge process with social worker, nursing staff and therapy department. Medical justification for home health services/DME verified.    Durenda Age, DNP, MSN, FNP-BC [facility] [phone] (Monday-Friday 8:00 a.m. - 5:00 p.m.) [phone] (after hours)

## 2021-05-14 ENCOUNTER — Telehealth: Payer: Self-pay | Admitting: Registered Nurse

## 2021-05-14 MED ORDER — MORPHINE SULFATE 15 MG PO TABS: ORAL_TABLET | ORAL | 0 refills | Status: DC

## 2021-05-14 NOTE — Telephone Encounter (Signed)
Patient has 1 pill left for today still in [place] from having covid, discharged from [facility] last wed.

## 2021-05-14 NOTE — Telephone Encounter (Signed)
Spoke with [name], Ms. [place]idel[place] daughter, she is aware Ms. Bolon MSIR 15 mg was sent. She will count Ms [name] MS Contin and send a MY Chart Message with the count, she verbalizes understanding. [name] also states the family is assisting Ms. Santore with her medications. We will continue to monitor.

## 2021-05-14 NOTE — Addendum Note (Signed)
Addended by: [name] on: 05/14/2021 02:11 PM   Modules accepted: Orders

## 2021-05-14 NOTE — Telephone Encounter (Signed)
Medication List and PMP was Reviewed.  Placed a call to [name], unable to leave a voice mail. Voicemail full.  Placed a call to [place] daughter,

## 2021-05-22 ENCOUNTER — Encounter: Payer: Self-pay | Admitting: Family Medicine

## 2021-05-22 ENCOUNTER — Ambulatory Visit (INDEPENDENT_AMBULATORY_CARE_PROVIDER_SITE_OTHER): Payer: Medicare Other | Admitting: Family Medicine

## 2021-05-22 VITALS — BP 108/70 | HR 97 | Temp 98.0°F

## 2021-05-22 DIAGNOSIS — R5381 Other malaise: Secondary | ICD-10-CM | POA: Diagnosis not present

## 2021-05-22 DIAGNOSIS — L720 Epidermal cyst: Secondary | ICD-10-CM

## 2021-05-22 DIAGNOSIS — N183 Chronic kidney disease, stage 3 unspecified: Secondary | ICD-10-CM | POA: Diagnosis not present

## 2021-05-22 DIAGNOSIS — A419 Sepsis, unspecified organism: Secondary | ICD-10-CM

## 2021-05-22 DIAGNOSIS — I5032 Chronic diastolic (congestive) heart failure: Secondary | ICD-10-CM | POA: Diagnosis not present

## 2021-05-22 DIAGNOSIS — N390 Urinary tract infection, site not specified: Secondary | ICD-10-CM | POA: Diagnosis not present

## 2021-05-22 DIAGNOSIS — M05741 Rheumatoid arthritis with rheumatoid factor of right hand without organ or systems involvement: Secondary | ICD-10-CM

## 2021-05-22 DIAGNOSIS — J849 Interstitial pulmonary disease, unspecified: Secondary | ICD-10-CM

## 2021-05-22 DIAGNOSIS — E1140 Type 2 diabetes mellitus with diabetic neuropathy, unspecified: Secondary | ICD-10-CM

## 2021-05-22 DIAGNOSIS — M05742 Rheumatoid arthritis with rheumatoid factor of left hand without organ or systems involvement: Secondary | ICD-10-CM

## 2021-05-22 MED ORDER — PREDNISONE 10 MG PO TABS: 10.0000 mg | ORAL_TABLET | Freq: Every day | ORAL | 1 refills | Status: DC

## 2021-05-22 NOTE — Progress Notes (Signed)
Subjective:    Patient ID: [name], female    DOB: 08/15/1940, 81 y.o.   MRN: [id number]  Chief Complaint  Patient presents with   [facility] f/u,-sepsis- still having a lot of pain, coughing up a lot of yellow mucus.   Patient accompanied by her daughter.  HPI Patient is a 81 yo female with pmh sig for interstitial lung dz, chroinic diastolic CHF, anemia, asthma, arthritis, DM2 with neuropathy, RA on prednisone, chronic renal insufficiency was seen today for hospital follow-up.  Patient admitted 11/12-03/2015 for confusion and poor p.o. intake after MS IR dose increased from BID to TID by pain management. Pt also had abd pain, loose stools, and decreased UOP x 3-4 days.    Hospital course per d/c summary:  "In ED, febrile to 100.5.  WBC 9.5.  Lactic acid normal.  Cr 2.2 (baseline 0.7-0.8).  K3.1.  Mg 1.6.  BNP and ammonia normal.  CXR consistent with underlying ILD and unchanged from baseline.  CT head without acute intracranial finding.  UA with large LE, small Hgb and rare bacteria.  VBG 7.37/66/98/38.  Repeat ABG 4 hours later 7.46/46/70/33.  Cultures obtained.  Received IV boluses.  Started on IVF and IV ceftriaxone for possible sepsis due to possible UTI, and admitted.    The next day, AKI and encephalopathy resolved.  Slightly tachycardic at rest.  Blood cultures negative.  Urine culture with Klebsiella pneumonia.  Patient was continued on IV ceftriaxone for 3 days and transition to p.o. Keflex for 2 more days to complete treatment course for Klebsiella pneumonia UTI.   Patient had some chest tightness, dyspnea and edema from IV fluid resuscitation and holding diuretics in the setting of AKI.  Received IV Lasix 60 mg x 1 with resolution of his symptoms, now restarted on home p.o. Lasix.   Patient is discharged to SNF as recommended by therapy."  Pt notes increased pain as while in rehab her prednisone dose was decreased from 10 mg daily to 5 mg daily.  While in rehab,  pt caught COVID which delayed her d/c.   Pt currently using 3L supplemental O2 continuously.  Previously on 2 L at night.  Patient notes pain in joints makes it difficult to move.  Currently using MS IR BID  Has appt with Pulm later this wk.  Developed 2 bumps on face that are increasing in size, not painful, and without drainage.  Had another bump underneath R breast with drainage.  Denies urinary symptoms, was symptomatic during hospitalization.  Past Medical History:  Diagnosis Date   Acute on chronic diastolic (congestive) heart failure (HCC)    Anemia    iron deficiency anemia - secondary to blood loss ( chronic)    Anxiety    Arthritis    endstage changes bilateral knees/bilateral ankles.    Asthma    Carotid artery occlusion    Chronic fatigue    Chronic kidney disease    Closed left hip fracture (HCC)    Clotting disorder ([place])    pt denies this   Contusion of left knee    due to fall 1/14.   COPD (chronic obstructive pulmonary disease) (HCC)    pulmonary fibrosis   Depression, reactive    Diabetes mellitus    type II    Diastolic dysfunction    Difficulty in walking    Family history of heart disease    Generalized muscle weakness    Gout    High cholesterol  History of falling    Hypertension    Hypothyroidism    Interstitial lung disease ([place])    Meningioma of left sphenoid wing involving cavernous sinus (HCC) 02/17/2012   Continue diplopia, left eye pain and left headaches.     Morbid obesity ([place])    Neuromuscular disorder ([place])    diabetic neuropathy    Normal coronary arteries    cardiac catheterization performed  10/31/14   Pneumonia    RA (rheumatoid arthritis) ([place])    has been off methotreaxte since 10/13.   Spinal stenosis of lumbar region    Thyroid disease    Unspecified lack of coordination    URI (upper respiratory infection)     Allergies  Allergen Reactions   Codeine Swelling and Other (See Comments)    Facial swelling Chest pain and  swelling in legs  Facial swelling   Infliximab Anaphylaxis    "sent me into shock" "sent me into shock"   Lisinopril Swelling    Face and neck swelling Face and neck swelling    ROS General: Denies fever, chills, night sweats, changes in weight, changes in appetite +fatigue HEENT: Denies headaches, ear pain, changes in vision, rhinorrhea, sore throat CV: Denies CP, palpitations, SOB, orthopnea  +SOB, increased O2 requirement Pulm: Denies SOB, cough, wheezing +SOB GI: Denies abdominal pain, nausea, vomiting, diarrhea, constipation GU: Denies dysuria, hematuria, frequency, vaginal discharge Msk: Denies muscle cramps  +joint pains   Neuro: Denies weakness, numbness, tingling Skin: Denies rashes, bruising +skin lesions of face and draining lesion under R breast Psych: Denies depression, anxiety, hallucinations     Objective:    Blood pressure 108/70, pulse 97, temperature 98 F (36.7 C), temperature source Oral, SpO2 94 %. On 3L O2 via [place]  Gen. Pleasant, well-nourished, in no distress, normal affect   HEENT: [place]/AT, face symmetric, conjunctiva clear, no scleral icterus, PERRLA, EOMI, nares patent without drainage, pharynx without erythema or exudate. Lungs: no accessory muscle use, CTAB, no wheezes or rales Cardiovascular: RRR, no m/r/g, no peripheral edema. Abdomen: BS present, soft, NT/ND Musculoskeletal: No deformities, no cyanosis or clubbing, normal tone Neuro:  A&Ox3, CN II-XII intact, sitting in transport wheel chair Skin:  Warm, dry, intact.  1 cm cystic lesion on right face lateral to right eye with mild hyperpigmentation of skin.  Similar 4 mm cystic lesion on left lateral cheek.  Open lesion with granulation tissue underneath right breast without drainage, erythema, induration.  Wt Readings from Last 3 Encounters:  05/08/21 229 lb 8 oz (104.1 kg)  05/02/21 229 lb 6.4 oz (104.1 kg)  05/01/21 228 lb 6.4 oz (103.6 kg)    Lab Results  Component Value Date   WBC 5.4  04/02/2021   HGB 11.0 (L) 04/02/2021   HCT 36.4 04/02/2021   PLT 126 (L) 04/02/2021   GLUCOSE 157 (H) 04/02/2021   CHOL 136 09/26/2020   TRIG 283.0 (H) 09/26/2020   HDL 40.00 09/26/2020   LDLDIRECT 59.0 09/26/2020   LDLCALC 75 09/22/2019   ALT 22 03/30/2021   AST 20 03/30/2021   NA 139 04/02/2021   K 4.1 04/02/2021   CL 99 04/02/2021   CREATININE 0.79 04/02/2021   BUN 7 (L) 04/02/2021   CO2 32 04/02/2021   TSH 4.598 (H) 01/16/2021   INR 1.0 03/30/2021   HGBA1C 6.5 (H) 04/02/2021    Assessment/Plan:  Physical deconditioning  -likely exacerbated by hospitalization, subsequent COVID 19 infection, and increased pain -Encouraged to increase physical activity as tolerated - Plan:  Ambulatory referral to Home Health  ILD (interstitial lung disease) ([place])  -Increased O2 requirement.  Now requiring 3 L O2 via [place] continuously -Likely exacerbated by recent COVID-19 infection -Continue current inhalers/medications -Keep O2 sats above 88%.  Wean O2 as tolerated. -Continue follow-up with pulmonology in town and at [place]: Ambulatory referral to [facility]  Epidermal cyst of face  -Discussed removal versus no intervention.  Advised may continue to increase in size. -pt interested in removal.  Referral to Derm. - Plan: Ambulatory referral to Dermatology  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 ([place]) -controlled -hgb A1c 6.5% on 04/02/21 -continue Ozempic, lipitor 10 mg, gabapentin 1200 mg TID -Obtain diabetic retinopathy screen and foot exam. -Continue lifestyle modifications.  Would likely benefit from increased protein intake.  Supplemental drinks as needed.  Chronic diastolic CHF (congestive heart failure) (HCC)  -Stable, improved -TTE 05/2018 EF 60-65% -Acute on chronic exacerbation during hospitalization likely due to fluid resuscitation given sepsis. -Continue current medications including Lasix 80 mg daily.  Metolazone as needed. - Plan: Ambulatory referral  to [facility] (chronic renal insufficiency), stage 3 (moderate) (HCC)  -stable -encephalopathy at time of hospital admission likely 2/2 decreased clearance of MS IR -renally dose meds and avoid nephrotoxic meds - Plan: Ambulatory referral to [facility], CMP, Magnesium  Rheumatoid arthritis involving both hands with positive rheumatoid factor (HCC)  -increased symptoms likely 2/2 decrease in prednisone from 10 mg to 5 mg while in rehab/SNF -will increase dose to 10 mg daily -f/u with Rheumatology - Plan: Ambulatory referral to [facility], predniSONE (DELTASONE) 10 MG tablet, CBC with Differential/Platelet  Sepsis due to urinary tract infection ([place])  -asymptomatic -completed abx course in hospital - Plan: CBC with Differential/Platelet  F/u prn in 1-2 months, sooner if needed.  More than 50% of over 32-35 minutes spent in total in caring for this patient was spent face-to-face, reviewing the chart, counseling and/or coordinating care.    Grier Mitts, MD

## 2021-05-23 ENCOUNTER — Other Ambulatory Visit: Payer: Self-pay

## 2021-05-23 ENCOUNTER — Encounter: Payer: Self-pay | Admitting: Internal Medicine

## 2021-05-23 ENCOUNTER — Ambulatory Visit (INDEPENDENT_AMBULATORY_CARE_PROVIDER_SITE_OTHER): Payer: Medicare Other | Admitting: Internal Medicine

## 2021-05-23 VITALS — BP 126/80 | HR 68 | Temp 98.9°F | Ht 64.0 in | Wt 224.2 lb

## 2021-05-23 DIAGNOSIS — Z79899 Other long term (current) drug therapy: Secondary | ICD-10-CM

## 2021-05-23 DIAGNOSIS — M359 Systemic involvement of connective tissue, unspecified: Secondary | ICD-10-CM | POA: Diagnosis not present

## 2021-05-23 DIAGNOSIS — Z8739 Personal history of other diseases of the musculoskeletal system and connective tissue: Secondary | ICD-10-CM

## 2021-05-23 DIAGNOSIS — Z7952 Long term (current) use of systemic steroids: Secondary | ICD-10-CM

## 2021-05-23 DIAGNOSIS — D84821 Immunodeficiency due to drugs: Secondary | ICD-10-CM

## 2021-05-23 DIAGNOSIS — J8489 Other specified interstitial pulmonary diseases: Secondary | ICD-10-CM

## 2021-05-23 DIAGNOSIS — I5032 Chronic diastolic (congestive) heart failure: Secondary | ICD-10-CM | POA: Diagnosis not present

## 2021-05-23 DIAGNOSIS — Z8616 Personal history of COVID-19: Secondary | ICD-10-CM

## 2021-05-23 LAB — COMPREHENSIVE METABOLIC PANEL
ALT: 18 U/L (ref 0–35)
AST: 16 U/L (ref 0–37)
Albumin: 3.6 g/dL (ref 3.5–5.2)
Alkaline Phosphatase: 91 U/L (ref 39–117)
BUN: 15 mg/dL (ref 6–23)
CO2: 32 mEq/L (ref 19–32)
Calcium: 8.7 mg/dL (ref 8.4–10.5)
Chloride: 103 mEq/L (ref 96–112)
Creatinine, Ser: 0.82 mg/dL (ref 0.40–1.20)
GFR: 68 mL/min (ref >60.00)
Glucose, Bld: 94 mg/dL (ref 70–99)
Potassium: 3.3 mEq/L — ABNORMAL LOW (ref 3.5–5.1)
Sodium: 143 mEq/L (ref 135–145)
Total Bilirubin: 0.5 mg/dL (ref 0.2–1.2)
Total Protein: 6.2 g/dL (ref 6.0–8.3)

## 2021-05-23 LAB — CBC WITH DIFFERENTIAL/PLATELET
Basophils Absolute: 0.1 10*3/uL (ref 0.0–0.1)
Basophils Relative: 1.5 % (ref 0.0–3.0)
Eosinophils Absolute: 0.3 10*3/uL (ref 0.0–0.7)
Eosinophils Relative: 3.9 % (ref 0.0–5.0)
HCT: 37.1 % (ref 36.0–46.0)
Hemoglobin: 11.7 g/dL — ABNORMAL LOW (ref 12.0–15.0)
Lymphocytes Relative: 29.8 % (ref 12.0–46.0)
Lymphs Abs: 2.5 10*3/uL (ref 0.7–4.0)
MCHC: 31.5 g/dL (ref 30.0–36.0)
MCV: 96.8 fl (ref 78.0–100.0)
Monocytes Absolute: 0.8 10*3/uL (ref 0.1–1.0)
Monocytes Relative: 9.1 % (ref 3.0–12.0)
Neutro Abs: 4.7 10*3/uL (ref 1.4–7.7)
Neutrophils Relative %: 55.7 % (ref 43.0–77.0)
Platelets: 195 10*3/uL (ref 150.0–400.0)
RBC: 3.83 Mil/uL — ABNORMAL LOW (ref 3.87–5.11)
RDW: 15.8 % — ABNORMAL HIGH (ref 11.5–15.5)
WBC: 8.4 10*3/uL (ref 4.0–10.5)

## 2021-05-23 LAB — MAGNESIUM: Magnesium: 1.8 mg/dL (ref 1.5–2.5)

## 2021-05-23 NOTE — Progress Notes (Signed)
OV 01/03/2015  Chief Complaint  Patient presents with   Follow-up    Pt c/o DOE, resolving cough that started over the weekend, chest discomfort when SOB and when lying down. Pt c/o increasing fatigue.    She has severe class 3 dyspnea with hypoxemia on account of BMI 50, diaast dsyfn, chronic pain, deconditioning and ILD due to RA on celcept/pred/bactrim   I personally last saw her in march 2-016. Since then has seen other provicers in this office and most recently in June 2016. In June 2016 admitted for acute CHF - cath showed mild pulm htn only with PA mean 24 and minimal CAD (reviewed chart of procedures June 2016). Dx as acute diast chf. Since then better. SHe had fu HRCT 11/29/14 that shows progression in ILD since march 2016 and now with UIP pattern (personally visualized image). She is finally accepting of fact that ILD is not makin cause of dyspnea and obesity is main problem. She is wondering about dc cellcept/pred/bactrim so as to minimize polypharmacy and side effect profile    OV 02/09/2015  Chief Complaint  Patient presents with   Follow-up    Pt states her breathing is doing well the past few days. Pt recently went to [facility] ED for CP. Pt c/o prod cough with tan mucus. Pt denies CP/tightness    She has severe class 3 dyspnea with hypoxemia on account of BMI 50, diaast dsyfn, chronic pain, deconditioning and ILD due to RA. She is having progressive ILD. But given her miserable quality of life and polypharmacy and other side effects we decided to stop the CellCept and Bactrim. We also advise prednisone taper. This was all in the end of August 2016. But on 01/27/2015 she ended up in the emergency room with some respiratory difficulty. Her prednisone was bumped up. She subsequently saw her rheumatologist [name] was now tapering her prednisone again. There is also some concerns of headaches and possibly temporal arteritis based on her description and apparently some kind of a  biopsy scheduled. She feels that since stopping her CellCept and Bactrim her cough is returned. Nevertheless weight and knee pain and back pain continued to be major issues for her along with polypharmacy. Her Polysorb was includes MS Contin  [name]    OV 02/21/2015  - a ACUTE VISIT  Chief Complaint  Patient presents with   Acute Visit    Pt c/o of chest tightness, productive cough with white/yellow mucus, and wheezing. Pt also c/o of feeling abdominal swelling/distention. Pt also c/o of constant fatigue. Pt states symptoms have been present x1 week.     Acute visit for this morbidly obese, rheumatoid arthritis patient with ILD who is now off CellCept  I saw her as recently as 02/09/2015. She reports that for the past 1 week she's having increased chest congestion, chest tightness, wheezing, shortness of breath, cough and yellow  sputum more than baseline. Symptoms are rated as moderate in severity. Relieved by inhaler. She has tried Mucinex for 1 week with some initial partial relief but symptoms progressed. So she made an acute visit.  She continues to stay off CellCept. She is not on prednisone 10 mg per day. She tells me [name] has held her prednisone at the dose because of concerns of temporal arteritis but apparently now that his diagnoses being eliminated in the differential diagnosis. She will see [name] in the next few weeks to decide  on further prednisone taper.    OV 04/16/2015  Chief Complaint  Patient presents with   Follow-up    Pt states she felt much better after completing the abx and pred. Pt states her SOB is at basline, prod cough with yellow mucus at times, pt c/o chest tightness.     Morbidly obese female with rheumatoid arthritis with interstitial lung disease UIP pattern with associated deconditioning and diastolic dysfunction  She continues to remain off CellCept. Last seen in October 2016. At that time I discharged her from the  office with doxycycline and prednisone taper. After that early November 2016 she was given another course of Doxy and prednisone burst. This did not help. Sputum culture at this time showed normal flora. She called again with the middle of November was given Levaquin and prednisone burst. She feels Levaquin helped her immensely. At this point in time she is reporting progressive dyspnea and also worsening arthritis. She wants to go back on immunomodulators again. She has discussed Imuran with [name]. She is due to see [name] tomorrow. She also feels that she is unable to come off prednisone. She feels at a minimum she needs 10 mg of prednisone per day. She will discuss this with Dr. D her rheumatologist tomorrow. She's also interested in a second opinion at [facility] interstitial lung disease clinic which I did discuss with her most recently. She continues to use oxygen.     OV 07/17/2015  Chief Complaint  Patient presents with   Follow-up    Pt states that she has improved. Pt c/o continued SOB with exertion, some ocugh and wheeze, and some intermittent chest tightness. Pt states that she does sleep a lot. Pt states that her heart rate remains high and that cardiology said it was due to her lungs. Pt is currently on Lasix, 77m, but still seems to retain a lot of fluid and is not seeming to go to the bathroom as frequently.    Follow-up interstitial lung disease secondary to rheumatoid arthritis in the setting of morbid obesity, poor quality of life and heavy chronic pain opioid use and depressive symptoms  Last seen November 2016. Since then she has seen [name] rheumatology clinic. She was given instructions on Imuran which she has red but she has not decided whether she should take it or not. She subsequently has followed up with [facility] interstitial lung disease clinic. CT scan of the chest reviewed result shows possible UIP pattern 06/12/2015. She had autoimmune profile which was strongly positive for  both rheumatoid factor and cyclic citrulline peptide both markers for rheumatoid arthritis. She did see Dr. CMargreta JourneyB on 07/02/2015. It appears that the note is not complete but according to the patient was supposed to be a multidisciplinary conference and they will get back to her. It is possible a second immunomodulators agent and is being contemplated but patient is leery of this given prior issues with opportunistic infection not otherwise specified and admissions to the hospital. In the interim she is also seen cardiology Janr 2017 Dr. BGwenlyn Foundwho did not feel dyspnea was cardiac related. I reviewed his note  OV 10/17/2015  Chief Complaint  Patient presents with   Follow-up    Pt reports she feels improved. Pt c/o continued SOB with exertion, some wheeze and occasional cough. Pt denies CP/tightness.     Follow-up interstitial lung disease secondary to rheumatoid arthritis in the setting of morbid obesity, poor quality of life and heavy chronic pain  opioid use and depressive symptoms   Since seeing me last in February 2017. She followed up at [facility] again pulmonary clinic in March 2017. I reviewed those notes. She saw Dr. Margreta Journey B on 07/31/2015. Due to the presence of bronchiectasis she's been started on  azithromycin 3 times a week. She says this has helped. In addition due to physical deconditioning and obesity she is getting home physical therapy is also help. She is interested in pulmonary rehabilitation although given obesity and back pain can be challenging. She nevertheless wants to try. She is on daily prednisone 10 mg a day. She met with [name] in the rheumatology clinic and has been referred now to [facility] rheumatology clinic for second opinion for a second immunomodulators. She continues to be morbidly obese and has been losing weight. She is somewhat interested in visiting with a [facility] for weight loss.   OV 02/18/2016  Chief Complaint  Patient presents with    Follow-up    Pt states she did not go to pulm rehab d/t BIL knee pain. Pt denies change in SOB, significant cough, CP/tightness and f/c/s. Pt states she is exercising at home. pt states she is doing well overall.    Problem List: 1.  Extensive bronchiectasis - cylindrical vs. Traction - has been present and not significantly changed from 2014 to 2017                       - has history of hospitalization for what sounds like pneumonia vs. Bronchiectasis flare as far back as 1999; underwent bronchoscopy which made her feel "like a new woman" in 1999 2.  Pulmonary fibrosis - basilar predominant reticular opacities that have not change from 2014 to 2017 3.  Rheumatoid arthritis: RF 959 ; CCP >300                       - has been treated with methotrexate, biologics, etc in the past - none in the past several years                       - currently on 7.5/53m alter day 4.  Morbid obesity with deconditioning - has had several hospitalizations in recent months; very deconditioned at present time  - [facility]     Followup for above  - [facility]. [facility] Review of the notes suggest that they have her on a slightly lower dose of prednisone with intent to taper to 5 mg per day. He also have her on hydroxychloroquine. No further disease modifying agents are being considered for rheumatoid arthritis. In terms of her interstitial lung disease and bronchiectasis she's on conservative therapy along with Activella device. This is helping and she feels better. Today she came in walking with her walker. This the happiest I have f seen her. She's not lost any weight but [facility]. She tried to go to pulmonary rehabilitation but could not because of knee pain. She is up-to-date with pneumonia vaccine. She's had a flu shot this  season.    OV 03/18/2016  Chief Complaint  Patient presents with   Acute Visit    Pt states breathing is not that bad today. Pt states she has cogestion build up, no tightness, SOB w/o excertion. Pt  states breathing has been better since last visit except the mucus. No cough feels the need to cough but can not.      follow-up bronchiectasis and immunosppessive thrapy. Visit 3 month olow-up. Body mssindex is stll very high but she says she has lost weight. Overal well. Last few days increasd symptoms of mucus, cough and subjective unwellness. No fever or colored sputum. But she feells she needs antibiotic and prednisone  OV 06/23/2016  Chief Complaint  Patient presents with   Follow-up    Pt states her SOB has been doing well but she has been more fatigued. Pt has an occ prod cough with light yellow mucus and chest tightness when SOB - resolves with rest.    Follow-up bronchiectasis with interstitial lung disease on immunosuppressive therapy due to rheumatoid arthritis. Last visit 01/17/2016. Since then she's lost 5 pounds of weight according to history. . She is here with her daughter. No acute bronchitis symptoms. Overall stable. The main issues that she's having increased joint pain and she is only on plaquenil. No fever or colored sputum.   OV 02/16/2017  Chief Complaint  Patient presents with   Follow-up    Pt states breathing has been okay. Stated she had a URI x1 month ago which she developed while in rehab after hip fx. Occ. SOB and occ. chest tightness. Denies any cough at this time.    Follow-up bronchiectasis with interstitial lung disease on immunosuppressive therapy due to rheumatoid arthritis. Last visit was 6 months ago approximately. Since then she has continued to lose weight through dietary control. She feels better. However in the summer 2018 she fell down due to orthostasis and fractured her left hip. This wasn't complicated by MRSA infection according to her history.  She is now undergoing physical therapy. Overall she's feeling well. She is very proud of her weight loss. Dyspnea is improved. She missed [facility] appointment for ILD clinic but she will make this again. She uses oxygen with heavy exertion at physical therapy and at night.  OV 02/11/2018  Subjective:  Patient ID: [name], female , DOB: February 16, 1941 , age 47 y.o. , MRN: [id number] , ADDRESS: [place] [id number]   02/11/2018 -   Chief Complaint  Patient presents with   Follow-up    Pt states she has been doing okay since last visit. States she has had some pain in the mid region of her chest x3 weeks and has also been having problems with fatigue. States she has had some occ coughing. Her main complaints are the pain in her chest and the exhaustion.     HPI [name] 81 y.o. -follow-up ILD with bronchiectasis in the setting of rheumatoid arthritis and obesity and physical deconditioning  It is almost a year since I last saw her.  Since then her daughter died in [place] with advanced sarcoma.  She is getting grief counseling from hospice.  She also has palliative care support at home.  She also in the last year has fallen and hurt her back.  She is on attending physical therapy which is helping her.  She says she is lost 30 pounds in the last 1 year.  Because of all this functional status is somewhat better but still she is extremely dyspneic.  In fact when she walked 185 feet x 3 laps on room air using a walker: She is only able to complete a lap and a half and stop because of extreme shortness  of breath.  Her resting pulse ox is 98%.  Final pulse ox was 98%.  And her heart rate went from 107/min to 140/min.  She has not yet had a flu shot.  Of note she is on prednisone 15 mg/day which has helped her arthritis.  She is asking if it is okay to go back down to 10 mg/day from a lung standpoint.  She is also on a different arthritis immunomodulator which is helped her  rheumatoid arthritis pain.  She gets this treatment from [facility]ology.  Pulmonary ILD medications also being managed at [facility].      OV 02/10/2019  Subjective:  Patient ID: [name], female , DOB: 1941-05-10 , age 45 y.o. , MRN: [id number] , ADDRESS: [address] [place] [id number] [name]. -follow-up ILD with bronchiectasis in the setting of rheumatoid arthritis and obesity and physical deconditioning  Problem List - copy and paste from [place] B: 1.  Extensive bronchiectasis - cylindrical vs. Traction - has been present and not significantly changed from 2014 to 2017                       - has history of hospitalization for what sounds like pneumonia vs. Bronchiectasis flare as far back as 1999; underwent bronchoscopy which made her feel "like a new woman" in 1999 2.  Pulmonary fibrosis - basilar predominant reticular opacities that have not change from 2014 to 2017 3.  Rheumatoid arthritis: RF 959 ; CCP >300                       - has been treated with methotrexate, biologics, etc in the past - none in the past several years                       - currently on 10 mg prednisone daily 4.  Morbid obesity with deconditioning - has had several hospitalizations in recent months; very deconditioned at present time   02/10/2019 -   Chief Complaint  Patient presents with   Follow-up    Pt here for ILD follow up. Pt states her breathing is unchanged since last OV in 01/2018. Pt denies new symptoms. Pt denies CP/tightness and f/s/c.      HPI [name] 81 y.o. -returns for follow-up.  I personally not seen her in 1 year.  In the interim she is kept up with follow-up at [facility].  In January 2020 she suffered from left lower extremity DVT after having shortness of breath.  She was admitted at [facility] and started on Eliquis.  She still continues with Eliquis.  She tells me that Morrie Sheldon was  considered as the cause although she does have obesity and sedentary lifestyle and venous incompetency.  It appears this was her first DVT.  She has had a CT angiogram chest in 2005 that was negative for PE and a Doppler ultrasound in 2016 and 2018 that was negative for DVT.  She says a cardiologist here locally is managing her Eliquis and plans to stop it after several months.  In terms of her rheumatoid arthritis she is now under the care of [place] rheumatology.  They just having her on prednisone.  In July 2020 she also saw Dr. Jacinto Reap pulmonary at [facility].  Apparently a walk test could not be performed.  She says she has lost weight.  Her main complaint is exertional dyspnea.  She wants to do portable oxygen.  She is only on nighttime oxygen.  In the past she always gets dyspneic way before she even desaturates a little bit this is on account of her obesity and deconditioning.  Her son is here with with her today.  This the first time I am meeting him.  He tells me that her breathing at night is rough although not as bad as his dad sleep apnea.  Patient is not on CPAP.  He is also curious about qualifying oxygen with walking.  Walking desaturation test for an 85 feet x 3 laps: She basically walked only half a lap.  Resting pulse ox was 97% with a heart rate of 102.  Final pulse ox was 96% with a heart rate of 122 and she stopped because of dyspnea.  She had a most recent CT chest there is resulted below and it is stable.   IMPRESSION: CT Chest high resolution 1. Mid and lower lung zone predominant bronchiectasis, peribronchovascular ground-glass and scattered subpleural reticular densities, grossly stable from 11/29/2014. Findings are nonspecific and can be seen with nonspecific interstitial pneumonitis. Usual interstitial pneumonitis is considered less likely but respiratory motion and expiratory phase imaging make assessment difficult. 2. Coronary artery calcification.     Electronically  Signed   By: Lorin Picket M.D.   On: 11/24/2018 13:04  ROS - per HPI  Results for DEJON, JUNGMAN" (MRN [id number]) as of 02/10/2019 14:47  Ref. Range 04/17/2015 18:02 04/18/2015 13:30  D-Dimer, America Brown Latest Ref Range: 0.00 - 0.50 ug/mL-FEU 2.57 (H) 2.54 (H)   OV 06/21/2019  Subjective:  Patient ID: [name], female , DOB: 14-Oct-1940 , age 42 y.o. , MRN: [id number] , ADDRESS: [place] [id number]   06/21/2019 -   Chief Complaint  Patient presents with   Follow-up    Pt states she was recently admitted to hospital at [facility] for 10 days. Since then, pt has been  having some low O2 sats and issues with SOB. Pt states she is better now than she was.   [name]. -follow-up ILD with bronchiectasis in the setting of rheumatoid arthritis and obesity and physical deconditioning  Problem List - copy and paste from [place] B: 1.  Extensive bronchiectasis - cylindrical vs. Traction - has been present and not significantly changed from 2014 to 2017                       - has history of hospitalization for what sounds like pneumonia vs. Bronchiectasis flare as far back as 1999; underwent bronchoscopy which made her feel "like a new woman" in 1999 2.  Pulmonary fibrosis - basilar predominant reticular opacities that have not change from 2014 to 2017 and in dec 2020 CT chest 3.  Rheumatoid arthritis: RF 959 ; CCP >300                       - has been treated with methotrexate, biologics, etc in the past - none in the past several years                       - currently on 10 mg prednisone daily 4.  Morbid obesity with deconditioning - has had several hospitalizations in recent months; very deconditioned at present time   HPI [name] 81  y.o. -admitted approximately 2 months ago early December 2020 with Klebsiella pansensitive urinary tract infection and bilateral lower extremity cellulitis.  Discharge on 2 L nasal  cannula.  Recommendations for home physical therapy were made.  Bilateral Doppler extremities were negative in the hospital.  Her CODE STATUS is full.  She returns for routine follow-up.  Her daughter is with her.  She continues to lose weight.  She has lost around 40 to 50 pounds.  She says she is on the medication for rheumatoid arthritis I do not know which one.  She says her cellulitis was quite bad and she is suffered from it.  But nevertheless with the weight loss her hypoxemia and overall joint pain is better.  She still has significant pedal edema and erythema in her lower extremities.  She is asking for some advice with that.  Otherwise no issues.  Using 2 L nasal cannula at night.  Walks around with a walker.  Symptom score is listed b       11/22/2020- Interim hx  with NP Patient presents today for 2 week follow-up PNA. She had a CXR that showed slight increase in opacity right lung base, questionable infiltrate. Treated with Augmentin 1 tab BID x 10 days. She is feeling a great deal better. She no longer has right sided pain. She still has some wheezing and cough with yellow mucus. She is taking muciex 1,277m twice a day. She is using her flutter valve 3-4 times a day. She has been using nebulizer twice a day. She takes 124mprednisone daily. She is on Toprol XL 7588mshe has not taken this yet today. She takes Azithromycin MWF pulmonary with [facility]. She can resume this. Needs repeat CXR today.  TEST/EVENTS : High-resolution CT chest November 24, 2018 mid to lower lung bronchiectasis, groundglass reticular densities stable since 2016  [facility]\  Interval History:  Has been doing ok until about a month ago.  Had pneumonia on the right side a month ago. Even now, sometimes she feels that she has pain in her R chest - upper front chest. Even sometimes today, it feels kind of bad.   She was doing a lot of mucus production. They she started hurting badly on the right side.  Thought she was sleeping too much. Did not pay it any mind. Went to see the doctor and she told them about the pain. They did an xray and diagnosed with pneumonia. She did not have any fever or chills - but she says that she very seldom in her life has had temp >99. She took antibiotics. Follow up xray revealed clearing. She was put on prednisone 40 mg with taper. She still gets kind of hoarse and has mucus in her upper throat. She is scheduled now for surgery for cataracts on August 9th.   She has started getting joint pain back in her knees again.   She was out of azithromycin. She [facility] [facility]. Needs a 90 day supply.   Using Acapella. She has been using her albuterol nebulizer at home. She still has a lot of wheezing. This wheezing sometimes clears with coughing mucus, sometimes does not. Unsure if this gets better with the nebulizer. Happens more when she is laying down.   At the local pulmonologist, she needed oxygen and now has oxygen at home. Using [place] is using 2L with (sometimes 3L). This is with  laying down. When walking and moving, she keeps it on 2L.   Mucus production is getting better. She is using the acapella three or four times per day. She is using the nebulizer twice per day.   Has R chest discomfort which feels a bit worse today than prior. She says that it feels like a strain and it affects her voice. When she talks, it is like it is pulling or straining something.   Has help now every day at home. Son has been on her case about that.   Is on 10 mg prednisone - and has been on this dose for about a week.   Outside echo 06/04/2018: Study Conclusions   - Left ventricle: The cavity size was normal. There was mild focal    basal hypertrophy of the septum. Systolic function was normal.    The estimated ejection fraction was in the range of 60% to 65%.    Wall motion was normal; there were no regional wall motion     abnormalities.  - Right ventricle: The cavity size was mildly dilated. Wall    thickness was normal.  - Pulmonary arteries: Systolic pressure was mildly increased.   Echo pharm stress test with contrast 12/15/2018:  NORMAL STRESS TEST. NORMAL RESTING STUDY WITH NO WALL MOTION ABNORMALITIES AT REST AND PEAK STRESS. NO DOPPLER PERFORMED FOR VALVULAR REGURGITATION NO DOPPLER PERFORMED FOR VALVULAR STENOSIS NORMAL RESTING BP - APPROPRIATE RESPONSE  Cath done on 10/31/14:  Mild CAD without significant obstructive disease. Ectasia in all three  major proximal vessels.  Mild pulmonary hypertension. Aortic saturation 87%. Pulmonary artery  saturation 60%. Cardiac output 7.6 L. Cardiac index 3.3.  Continue medical therapy for lung disease. Continue preventative therapy  for her heart disease.Addendum by [name], MD on 10/31/2014 12:01 PM  Mild CAD without significant obstructive disease. Ectasia in all three  major proximal vessels.  Mild pulmonary hypertension. Aortic saturation 87%. Pulmonary artery  saturation 60%. Cardiac output 7.6 L. Cardiac index 3.3.  Current Functional Status:  Oxygen: uses 2-3L of oxygen at home PRN and with sleep.  Level of exertion that leads to dyspnea: Walking any distance makes her dyspneic.   Rehab: using the walker most of the time. When she goes out, she uses a wheelchair. Home PT was helpful. She had a problem getting in and out of vehicle and bed.  Laboratory Data/Test Results: Pulmonary Function Test (PFT) Latest Ref Rng & Units 07/02/2015 12/14/2017  FVC PRE L 1.78 1.58  FVC % PRE PRED % 63 57  FEV1 PRE L 1.49 1.33  FEV1 % PRE PRED % 70 65  FEV1/FVC PRE % 83.74 84.24  FEF25-75% PRE L/s 2.83 2.39  FEF25-75% % PRE PRED % 154 141  DLCO PRE ml/(min*mmHg) 11.28 11.41  DLCO % PRE PRED % 65 67   No PFTs today.  PFTs 12/14/17: FVC 1.58, 57 % predicted. FEV1 1.33, 65% predicted. FEV1/FVC 84.24. DLCO 11.41, 67% predicted. Compared to  06/2015, FVC is down 11.2%. DLCO is essentially stable.   Echocardiogram: 11/21/2015 - EF >55% with normal wall motion. Mildly enlarged RV with normal free wall. Mildly enlarged RA. Normal respiratory collapse of IVC. Peak RV pressure 41 mmHg.   EOTT today: Room air Spo2 96% at rest. HR at start of exercise was 122 / max HR was 150 during exercise = 106% predicted. Lowest SpO2 95% on room air. Walked 258 meters = 846 feet = 81% predicted. Total walk time = 5 minutes. Borg  dyspnea scale = 7. Test ended early because of dyspnea, fatigue, and chest tightness.   Labs:  None today  Impression: Patient is a 81 y.o. with PMH significant for rheumatoid arthritis, bronchiectasis (with flare vs. Pneumonia in late 2016), pulmonary fibrosis (not progressed per CT from 2014 to 06/2015), morbid obesity, and deconditioning who comes to pulmonary clinic for follow up of lung disease and dyspnea. She has been maintained on an aggressive bronchiectasis regimen (Acapella, nebulizer, TIW azithromycin) and she has done well. Unfortunately, she ran out of azithromycin, and in the setting of this, she developed increased cough and sputum and was treated for pneumonia.   Today, she feels like she is recovered from the PNA, but she is not all the way back to her baseline. She has some congestion in her throat and she is concerned about pain in her chest (which is reproducible with palpation over the sternum.  She has limited mobility at home. She uses furniture / counters to get around if she is not using her walker. She does some physical therapy.   Plan - from patient instructions:  I have renewed your azithromycin - you have a year supply that will be sent to [facility] in [place], [place] [facility] [place]). You should take this every Monday, Wednesday, Friday.   I am also going to write a prescription for a 1 week supply of DAILY azithromycin. So take it daily for 7 days, then switch to every Monday, Wednesday, Friday. The  prescription is written so that this can all be under a single prescription.   Continue to use your Acapella at least 3-4 times per day.   We are going to get another chest x ray today.   Regarding your nebulizer, we will renew albuterol order from [facility].   Plan to take your nebulizer three times per day (along with Acapella use). Once your congestion is improved, you may back off to twice per day.   Try to use your voltaren gel on your chest (nearly your sternum where it is sore) to see if this helps with your pain.   Return in about 6 months (around 06/12/2021).     OV 05/23/2021  Subjective:  Patient ID: [name], female , DOB: 01/22/1941 , age 81 y.o. , MRN: [id number] , ADDRESS: [place] [id number] PCP [name], MD Patient Care Team: [name], MD as PCP - General (Family Medicine) Lorretta Harp, MD as PCP - Cardiology (Cardiology) Duffy, Creola Corn, LCSW as Social Worker (Licensed Clinical Social Worker) Kidney, Nelida Meuse, Abel Presto, MD as Consulting Physician (Rheumatology) Efraim Kaufmann, NP (Rheumatology) Theodosia Blender, MD as Referring Physician (Pulmonary Disease)  This Provider for this visit: Treatment Team:  Attending Provider: Brand Males, MD    05/23/2021 -   Chief Complaint  Patient presents with   Follow-up    Pt states since last OV, she has been in and out of the hospital. States she also ended up with Covid and has had problems with a lot of coughing and phlegm build-up. Pt's O2 sats had been dropping and had to bump O2 up to 3L    [name]. -follow-up ILD with bronchiectasis in the setting of rheumatoid arthritis and obesity and physical deconditioning  Problem List - copy and paste from [place] [facility]: 1.  Extensive bronchiectasis - cylindrical vs. Traction - has been present and not significantly changed from 2014 to 2017                       -  has history of  hospitalization for what sounds like pneumonia vs. Bronchiectasis flare as far back as 1999; underwent bronchoscopy which made her feel "like a new woman" in 1999 2.  Pulmonary fibrosis - basilar predominant reticular opacities that have not change from 2014 to 2017 and in dec 2020 CT chest 3.  Rheumatoid arthritis: RF 959 ; CCP >300                       - has been treated with methotrexate, biologics, etc in the past - none in the past several years                       - currently on 10 mg prednisone daily 4.  Morbid obesity with deconditioning - has had several hospitalizations in recent months; very deconditioned at present time  - intentl wt loss +  HPI [name] 80 y.o. -returns for follow-up.  I personally not seen her in 2 years almost.  In between she is followed up with nurse practitioner and also [facility].  She is here with her daughter to [place] who I am meeting for the first time.  They tell me that she has had 2 admissions for dehydration in 2022.  After that she went to [facility] rehab where in November 2022 she ended up with COVID and was given antiviral.  They were unhappy with the admission to [facility] rehab.  They do not want to go there again.  Apparently after that she is now needing 3 L of oxygen while previously she is only needing 2 L of nasal cannula oxygen.  Last CT scan of the chest echocardiogram 2020 including review of the medical records at [place] pulmonary function test with [place] was 2015 while at [facility] it was in 2019.  She continues to have functional disability.  She uses a walker to get around and change her clothes.  She continues to be immunosuppressed with prednisone, Plaquenil and also leflunomide for rheumatoid arthritis.  She has chronic pain and is on opioids \  SYMPTOM SCALE - ILD 06/21/2019 Birth weight not on file Last Weight  Most recent update: 06/21/2019 11:53 AM    Weight  113.3 kg (249 lb 12.8 oz)              05/23/2021 224# (used to 300# some yearsaago)  O2 use 2L nithg 3L  Shortness of Breath 0 -> 5 scale with 5 being worst (score 6 If unable to do)   At rest 2 2  Simple tasks - showers, clothes change, eating, shaving 5 4  Household (dishes, doing bed, laundry) 4   Shopping 6   Walking level at own pace 3 4  Walking up Stairs 6   Total (30-36) Dyspnea Score 26 3  How bad is your cough? 3 3  How bad is your fatigue 0 0  How bad is nausea 0 0  How bad is vomiting?  0 0  How bad is diarrhea? 0 0  How bad is anxiety? 2 4  How bad is depression 2 3   CT Chest data  No results found.    PFT  PFT Results Latest Ref Rng & Units 01/12/2014 09/12/2013 04/28/2013  FVC-Pre L 1.59 1.78 1.73  FVC-Predicted Pre % 76 77 74  FVC-Post L 1.46 1.54 1.19  FVC-Predicted Post % 70 66 51  Pre FEV1/FVC % % 91 91 84  Post FEV1/FCV % % 93 89 97  FEV1-Pre L 1.45 1.63 1.46  FEV1-Predicted Pre % 90 90 81  FEV1-Post L 1.36 1.36 1.15  DLCO uncorrected ml/min/mmHg 16.69 12.50 -  DLCO UNC% % 77 51 -  DLVA Predicted % 95 91 -  TLC L 3.55 3.57 2.94  TLC % Predicted % 74 70 58  RV % Predicted % 62 66 62       has a past medical history of Acute on chronic diastolic (congestive) heart failure (HCC), Anemia, Anxiety, Arthritis, Asthma, Carotid artery occlusion, Chronic fatigue, Chronic kidney disease, Closed left hip fracture (HCC), Clotting disorder ([place]), Contusion of left knee, COPD (chronic obstructive pulmonary disease) ([place]), Depression, reactive, Diabetes mellitus, Diastolic dysfunction, Difficulty in walking, Family history of heart disease, Generalized muscle weakness, Gout, High cholesterol, History of falling, Hypertension, Hypothyroidism, Interstitial lung disease ([place]), 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   reports that she has never smoked. She has never used smokeless tobacco.  Past Surgical History:  Procedure Laterality Date   ABDOMINAL HYSTERECTOMY     BRAIN SURGERY     Gamma knife 10/13. Needs repeat spring  '14   CARDIAC CATHETERIZATION N/A 10/31/2014   Procedure: Right/Left Heart Cath and Coronary Angiography;  Surgeon: [name], MD;  Location: [facility];  Service: Cardiovascular;  Laterality: N/A;   ESOPHAGOGASTRODUODENOSCOPY (EGD) WITH PROPOFOL N/A 09/14/2014   Procedure: ESOPHAGOGASTRODUODENOSCOPY (EGD) WITH PROPOFOL;  Surgeon: [name], MD;  Location: [facility];  Service: Endoscopy;  Laterality: N/A;   INCISION AND DRAINAGE HIP Left 01/16/2017   Procedure: IRRIGATION AND DEBRIDEMENT LEFT HIP;  Surgeon: [name], MD;  Location: [facility];  Service: Orthopedics;  Laterality: Left;   INTRAMEDULLARY (IM) NAIL INTERTROCHANTERIC Left 11/29/2016   Procedure: INTRAMEDULLARY (IM) NAIL INTERTROCHANTRIC;  Surgeon: [name], MD;  Location: [facility];  Service: Orthopedics;  Laterality: Left;   OVARY SURGERY     SHOULDER SURGERY Left    TONSILLECTOMY  age 86   VIDEO BRONCHOSCOPY Bilateral 05/31/2013   Procedure: VIDEO BRONCHOSCOPY WITHOUT FLUORO;  Surgeon: Brand Males, MD;  Location: [facility];  Service: Cardiopulmonary;  Laterality: Bilateral;   video bronscoscopy  2000   lung    Allergies  Allergen Reactions   Codeine Swelling and Other (See Comments)    Facial swelling Chest pain and swelling in legs  Facial swelling   Infliximab Anaphylaxis    "sent me into shock" "sent me into shock"   Lisinopril Swelling    Face and neck swelling Face and neck swelling    Immunization History  Administered Date(s) Administered   Fluad Quad(high Dose 65+) 02/02/2019, 03/11/2021   Influenza Split 01/29/2012, 03/30/2013,  02/10/2014   Influenza, High Dose Seasonal PF 02/16/2017, 02/11/2018, 01/31/2019, 02/27/2020   Influenza,inj,Quad PF,6+ Mos 02/09/2015, 12/18/2015   Influenza-Unspecified 02/09/2015, 12/18/2015   PFIZER Comirnaty(Gray Top)Covid-19 Tri-Sucrose Vaccine 07/24/2019, 08/14/2019, 02/27/2020   PFIZER(Purple Top)SARS-COV-2 Vaccination 07/17/2019, 08/14/2019   PPD Test 04/23/2015   Pneumococcal Conjugate-13 08/21/2014   Pneumococcal Polysaccharide-23 02/17/2012   Tdap 12/27/2015    Family History  Problem Relation Age of Onset   Diabetes Mother    Heart attack Mother    Hypertension Father    Lung cancer Father    Diabetes Sister    Breast cancer  Sister    Breast cancer Sister    Diabetes Brother    Hypertension Brother    Heart disease Brother    Heart attack Brother    Kidney cancer Brother    Gout Brother    Kidney failure Brother        x 5   Esophageal cancer Brother    Stomach cancer Brother    Prostate cancer Brother    Uterine cancer Daughter    Hypertension Daughter    Hypertension Daughter    Rheum arthritis Maternal Uncle    Hypertension Son    Heart murmur Son      Current Outpatient Medications:    acetaminophen (TYLENOL) 500 MG tablet, Take 1,000 mg by mouth every 6 (six) hours as needed for moderate pain. , Disp: , Rfl:    albuterol (PROVENTIL) (2.5 MG/3ML) 0.083% nebulizer solution, Take 3 mLs (2.5 mg total) by nebulization every 6 (six) hours as needed for wheezing., Disp: 75 mL, Rfl: 0   albuterol (VENTOLIN HFA) 108 (90 Base) MCG/ACT inhaler, Inhale 2 puffs into the lungs every 6 (six) hours as needed for wheezing or shortness of breath., Disp: 6.7 g, Rfl: 0   allopurinol (ZYLOPRIM) 300 MG tablet, Take 1 tablet (300 mg total) by mouth daily., Disp: 30 tablet, Rfl: 0   Artificial Tear Ointment (DRY EYES OP), Apply 1 drop to eye daily as needed (dry eyes)., Disp: , Rfl:    aspirin 325 MG tablet, Take 1 tablet (325 mg total) by mouth daily., Disp: 30 tablet, Rfl:  0   atorvastatin (LIPITOR) 20 MG tablet, Take 0.5 tablets (10 mg total) by mouth every evening., Disp: 15 tablet, Rfl: 0   Calcium Carb-Cholecalciferol (CALCIUM + D3 PO), Take 2 tablets by mouth daily with lunch. , Disp: , Rfl:    Cholecalciferol (VITAMIN D-3) 25 MCG (1000 UT) CAPS, Take 2 capsules (2,000 Units total) by mouth daily. GIVE ONE TAB PO Q DAY, Disp: 60 capsule, Rfl: 0   colchicine 0.6 MG tablet, Take 1 tablet (0.6 mg total) by mouth daily as needed (gout flares)., Disp: 30 tablet, Rfl: 0   cyclobenzaprine (FLEXERIL) 5 MG tablet, Take 1 tablet (5 mg total) by mouth at bedtime as needed for muscle spasms., Disp: 30 tablet, Rfl: 0   diclofenac Sodium (VOLTAREN) 1 % GEL, Apply 2 g topically 3 (three) times daily. Apply to both hands and knees, Disp: 100 g, Rfl: 1   DULoxetine (CYMBALTA) 60 MG capsule, Take 1 capsule (60 mg total) by mouth daily., Disp: 30 capsule, Rfl: 0   fluticasone (FLONASE) 50 MCG/ACT nasal spray, Place 2 sprays into both nostrils daily., Disp: 16 g, Rfl: 3   furosemide (LASIX) 80 MG tablet, Take 1 tablet (80 mg total) by mouth daily., Disp: 30 tablet, Rfl: 0   gabapentin (NEURONTIN) 600 MG tablet, Take 2 tablets (1,200 mg total) by mouth 3 (three) times daily., Disp: 180 tablet, Rfl: 0   guaiFENesin (MUCINEX) 600 MG 12 hr tablet, Take 2 tablets (1,200 mg total) by mouth 2 (two) times daily., Disp: 120 tablet, Rfl: 0   hydroxychloroquine (PLAQUENIL) 200 MG tablet, Take 1 tablet (200 mg total) by mouth daily., Disp: 30 tablet, Rfl: 0   leflunomide (ARAVA) 20 MG tablet, Take 1 tablet (20 mg total) by mouth daily., Disp: 30 tablet, Rfl: 0   levothyroxine (SYNTHROID) 50 MCG tablet, Take 1 tablet (50 mcg total) by mouth daily., Disp: 30 tablet, Rfl: 0   linaclotide (LINZESS) 72 MCG  capsule, Take 1 capsule (72 mcg total) by mouth daily before breakfast., Disp: 30 capsule, Rfl: 0   metolazone (ZAROXOLYN) 2.5 MG tablet, TAKE 1 TABLET BY MOUTH DAILY AS NEEDED FOR 2 DAYS FOR  WEIGHT ABOVE 255 POUNDS, Disp: 30 tablet, Rfl: 0   metoprolol succinate (TOPROL-XL) 25 MG 24 hr tablet, Take 5 tablets (125 mg total) by mouth daily., Disp: 150 tablet, Rfl: 0   morphine (MS CONTIN) 15 MG 12 hr tablet, Take 1 tablet (15 mg total) by mouth every 12 (twelve) hours., Disp: 30 tablet, Rfl: 0   morphine (MSIR) 15 MG tablet, One tablet twice a day as needed for break through pain., Disp: 60 tablet, Rfl: 0   Multiple Vitamin (MULTIVITAMIN WITH MINERALS) TABS, Take 1 tablet by mouth daily. , Disp: , Rfl:    olmesartan (BENICAR) 40 MG tablet, Take 1 tablet (40 mg total) by mouth daily., Disp: 30 tablet, Rfl: 0   OXYGEN-HELIUM IN, Inhale 3 L into the lungs at bedtime. And on exertion, Disp: , Rfl:    pantoprazole (PROTONIX) 40 MG tablet, take 1 tablet by mouth once daily MINUTES BEFORE 1ST MEAL OF THE DAY, Disp: 30 tablet, Rfl: 0   Potassium Chloride ER 20 MEQ TBCR, Take 20 mEq by mouth daily., Disp: 30 tablet, Rfl: 0   predniSONE (DELTASONE) 10 MG tablet, Take 1 tablet (10 mg total) by mouth daily with breakfast., Disp: 90 tablet, Rfl: 1   Semaglutide,0.25 or 0.5MG/DOS, (OZEMPIC, 0.25 OR 0.5 MG/DOSE,) 2 MG/1.5ML SOPN, Inject 0.25 mg as directed once a week., Disp: 1.5 mL, Rfl: 0   sodium chloride (OCEAN) 0.65 % SOLN nasal spray, Place 1 spray into both nostrils as needed for congestion., Disp: , Rfl:       Objective:   Vitals:   05/23/21 1444  BP: 126/80  Pulse: 66  Temp: 98.9 F (37.2 C)  TempSrc: Oral  SpO2: 98%  Weight: 224 lb 3.2 oz (101.7 kg)  Height: 5' 4" (1.626 m)    Estimated body mass index is 38.48 kg/m as calculated from the following:   Height as of this encounter: 5' 4" (1.626 m).   Weight as of this encounter: 224 lb 3.2 oz (101.7 kg).  _0 @  Filed Weights   05/23/21 1444  Weight: 224 lb 3.2 oz (101.7 kg)     Physical Exam    General: No distress.  Obese although looks better than what I recollect Neuro: Alert and Oriented x 3. GCS 15.  Speech normal Psych: Pleasant Resp:  Barrel Chest - no.  Wheeze - no, Crackles -yes of the lung base, No overt respiratory distress CVS: Normal heart sounds. Murmurs - no Ext: Stigmata of Connective Tissue Disease - RA HEENT: Normal upper airway. PEERL +. No post nasal drip        Assessment:       ICD-10-CM   1. Interstitial lung disease due to connective tissue disease ([place])  J84.89    M35.9     2. History of rheumatoid arthritis  Z87.39     3. Immunosuppression due to drug therapy ([place])  D84.821    Z79.899     4. Current chronic use of systemic steroids  Z79.52     5. History of 2019 novel coronavirus disease (COVID-19)  Z86.16          Plan:     Patient Instructions     ICD-10-CM   1. Interstitial lung disease due to connective tissue disease ([place])  J84.89  M35.9     2. History of rheumatoid arthritis  Z87.39     3. Immunosuppression due to drug therapy ([place])  D84.821    Z79.899     4. Current chronic use of systemic steroids  Z79.52      Time to restage your lungs esp after covid in Nov 2022 Last set of CT and echo ws in 2020  Plan  - do spiro and dlco pft test  - do HRCT supine +/- prone if possible  - do echo  - test room air at rest pulse ox 05/23/2021  Followup  - Dr Chase Caller - 30 min visit next few weeks to discus results and next steps  -Can be video visit  -If there is progressive ILD nintedanib would be an option.  -Also inhaled treprostinil if there is evidence of pulmonary hypertension    SIGNATURE    Dr. Brand Males, M.D., F.C.C.P,  Pulmonary and Critical Care Medicine Staff Physician, [place] Director - Interstitial Lung Disease  Program  Pulmonary [place] at [place], [place], [address]  Pager: [phone], If no answer or between  15:00h - 7:00h: call [phone] Telephone: [phone]  3:20 PM 05/23/2021

## 2021-05-23 NOTE — Patient Instructions (Addendum)
ICD-10-CM   1. Interstitial lung disease due to connective tissue disease ([place])  J84.89    M35.9     2. History of rheumatoid arthritis  Z87.39     3. Immunosuppression due to drug therapy ([place])  D84.821    Z79.899     4. Current chronic use of systemic steroids  Z79.52      Time to restage your lungs esp after covid in Nov 2022 Last set of CT and echo ws in 2020  Plan  - do spiro and dlco pft test  - do HRCT supine +/- prone if possible  - do echo  - test room air at rest pulse ox 05/23/2021  Followup  - Dr Chase Caller - 30 min visit next few weeks to discus results and next steps  -Can be video visit  -If there is progressive ILD nintedanib would be an option.  -Also inhaled treprostinil if there is evidence of pulmonary hypertension

## 2021-05-24 ENCOUNTER — Encounter: Payer: Self-pay | Admitting: Registered Nurse

## 2021-05-24 ENCOUNTER — Encounter: Payer: Medicare Other | Attending: Physical Medicine & Rehabilitation | Admitting: Registered Nurse

## 2021-05-24 VITALS — BP 157/96 | HR 99 | Temp 98.5°F

## 2021-05-24 DIAGNOSIS — Z5181 Encounter for therapeutic drug level monitoring: Secondary | ICD-10-CM | POA: Insufficient documentation

## 2021-05-24 DIAGNOSIS — G894 Chronic pain syndrome: Secondary | ICD-10-CM | POA: Insufficient documentation

## 2021-05-24 DIAGNOSIS — M5416 Radiculopathy, lumbar region: Secondary | ICD-10-CM | POA: Insufficient documentation

## 2021-05-24 DIAGNOSIS — M2550 Pain in unspecified joint: Secondary | ICD-10-CM | POA: Diagnosis not present

## 2021-05-24 DIAGNOSIS — Z79891 Long term (current) use of opiate analgesic: Secondary | ICD-10-CM | POA: Diagnosis not present

## 2021-05-24 DIAGNOSIS — M7062 Trochanteric bursitis, left hip: Secondary | ICD-10-CM | POA: Insufficient documentation

## 2021-05-24 DIAGNOSIS — G8929 Other chronic pain: Secondary | ICD-10-CM | POA: Insufficient documentation

## 2021-05-24 DIAGNOSIS — M546 Pain in thoracic spine: Secondary | ICD-10-CM | POA: Diagnosis not present

## 2021-05-24 DIAGNOSIS — M170 Bilateral primary osteoarthritis of knee: Secondary | ICD-10-CM | POA: Diagnosis not present

## 2021-05-24 DIAGNOSIS — M62838 Other muscle spasm: Secondary | ICD-10-CM | POA: Diagnosis not present

## 2021-05-24 DIAGNOSIS — M7061 Trochanteric bursitis, right hip: Secondary | ICD-10-CM | POA: Diagnosis not present

## 2021-05-24 MED ORDER — MORPHINE SULFATE 15 MG PO TABS: ORAL_TABLET | ORAL | 0 refills | Status: DC

## 2021-05-24 MED ORDER — MORPHINE SULFATE ER 15 MG PO TBCR: 15.0000 mg | EXTENDED_RELEASE_TABLET | Freq: Two times a day (BID) | ORAL | 0 refills | Status: DC

## 2021-05-24 NOTE — Progress Notes (Addendum)
Subjective:    Patient ID: [name], female    DOB: 1941-02-24, 81 y.o.   MRN: [id number]  HPI: [name] is a 81 y.o. female who returns for follow up appointment for chronic pain and medication refill. She states her pain is located in her  mid- lower back radiating into her bilateral hips and left lower extremity. She also reports generalized joint pain. She rates her pain 6. Her current exercise regime is walking in her home with walker, she arrived in wheelchair.   [name] was admitted to [facility] on 11/12 and discharged to SNF on 04/03/2021, she was admitted for Acute-Toxic encephalopathy and severe sepsis, Discharge summary was reviewed.   [name] daughter [name] is dispensing her medication, we will continue to monitor. [name] agrees with the above and verbalizes understanding.      Pain Inventory Average Pain 8 Pain Right Now 6 My pain is sharp, burning, stabbing, and aching  In the last 24 hours, has pain interfered with the following? General activity 8 Relation with others 8 Enjoyment of life 8 What TIME of day is your pain at its worst? morning  and night Sleep (in general) Fair  Pain is worse with: walking, bending, and inactivity Pain improves with: medication Relief from Meds: 5  Family History  Problem Relation Age of Onset   Diabetes Mother    Heart attack Mother    Hypertension Father    Lung cancer Father    Diabetes Sister    Breast cancer Sister    Breast cancer Sister    Diabetes Brother    Hypertension Brother    Heart disease Brother    Heart attack Brother    Kidney cancer Brother    Gout Brother    Kidney failure Brother        x 5   Esophageal cancer Brother    Stomach cancer Brother    Prostate cancer Brother    Uterine cancer Daughter    Hypertension Daughter    Hypertension Daughter    Rheum arthritis Maternal Uncle    Hypertension Son    Heart murmur Son    Social  History   Socioeconomic History   Marital status: Married    Spouse name: Not on file   Number of children: 6   Years of education: college   Highest education level: Not on file  Occupational History   Occupation: retired Marine scientist  Tobacco Use   Smoking status: Never   Smokeless tobacco: Never  Vaping Use   Vaping Use: Never used  Substance and Sexual Activity   Alcohol use: No   Drug use: No   Sexual activity: Not Currently  Other Topics Concern   Not on file  Social History Narrative   Patient consumes 2-3 cups coffee per day.   Social Determinants of Health   Financial Resource Strain: Low Risk    Difficulty of Paying Living Expenses: Not hard at all  Food Insecurity: No Food Insecurity   Worried About Charity fundraiser in the Last Year: Never true   [place] in the Last Year: Never true  Transportation Needs: No Transportation Needs   Lack of Transportation (Medical): No   Lack of Transportation (Non-Medical): No  Physical Activity: Not on file  Stress: No Stress Concern Present   Feeling of Stress : Not at all  Social Connections: Moderately Integrated   Frequency of Communication with Friends and Family:  Twice a week   Frequency of Social Gatherings with Friends and Family: Twice a week   Attends Religious Services: 1 to 4 times per year   Active Member of [employer] or Organizations: No   Attends Archivist Meetings: Never   Marital Status: Married   Past Surgical History:  Procedure Laterality Date   ABDOMINAL HYSTERECTOMY     BRAIN SURGERY     Gamma knife 10/13. Needs repeat spring  '14   CARDIAC CATHETERIZATION N/A 10/31/2014   Procedure: Right/Left Heart Cath and Coronary Angiography;  Surgeon: [name], MD;  Location: [facility];  Service: Cardiovascular;  Laterality: N/A;   ESOPHAGOGASTRODUODENOSCOPY (EGD) WITH PROPOFOL N/A 09/14/2014   Procedure: ESOPHAGOGASTRODUODENOSCOPY (EGD) WITH PROPOFOL;   Surgeon: [name], MD;  Location: [facility];  Service: Endoscopy;  Laterality: N/A;   INCISION AND DRAINAGE HIP Left 01/16/2017   Procedure: IRRIGATION AND DEBRIDEMENT LEFT HIP;  Surgeon: [name], MD;  Location: [facility];  Service: Orthopedics;  Laterality: Left;   INTRAMEDULLARY (IM) NAIL INTERTROCHANTERIC Left 11/29/2016   Procedure: INTRAMEDULLARY (IM) NAIL INTERTROCHANTRIC;  Surgeon: [name], MD;  Location: [facility];  Service: Orthopedics;  Laterality: Left;   OVARY SURGERY     SHOULDER SURGERY Left    TONSILLECTOMY  age 81   VIDEO BRONCHOSCOPY Bilateral 05/31/2013   Procedure: VIDEO BRONCHOSCOPY WITHOUT FLUORO;  Surgeon: Brand Males, MD;  Location: [facility];  Service: Cardiopulmonary;  Laterality: Bilateral;   video bronscoscopy  2000   lung   Past Surgical History:  Procedure Laterality Date   ABDOMINAL HYSTERECTOMY     BRAIN SURGERY     Gamma knife 10/13. Needs repeat spring  '14   CARDIAC CATHETERIZATION N/A 10/31/2014   Procedure: Right/Left Heart Cath and Coronary Angiography;  Surgeon: [name], MD;  Location: [facility];  Service: Cardiovascular;  Laterality: N/A;   ESOPHAGOGASTRODUODENOSCOPY (EGD) WITH PROPOFOL N/A 09/14/2014   Procedure: ESOPHAGOGASTRODUODENOSCOPY (EGD) WITH PROPOFOL;  Surgeon: [name], MD;  [facility];  Service: Endoscopy;  Laterality: N/A;   INCISION AND DRAINAGE HIP Left 01/16/2017   Procedure: IRRIGATION AND DEBRIDEMENT LEFT HIP;  Surgeon: [name], MD;  [facility];  Service: Orthopedics;  Laterality: Left;   INTRAMEDULLARY (IM) NAIL INTERTROCHANTERIC Left 11/29/2016   Procedure: INTRAMEDULLARY (IM) NAIL INTERTROCHANTRIC;  Surgeon: [name], MD;  Location: [facility];  Service: Orthopedics;  Laterality: Left;   OVARY SURGERY     SHOULDER SURGERY Left    TONSILLECTOMY  age 35   VIDEO BRONCHOSCOPY Bilateral 05/31/2013   Procedure:  VIDEO BRONCHOSCOPY WITHOUT FLUORO;  Surgeon: Brand Males, MD;  Location: [place];  Service: Cardiopulmonary;  Laterality: Bilateral;   video bronscoscopy  2000   lung   Past Medical History:  Diagnosis Date   Acute on chronic diastolic (congestive) heart failure (HCC)    Anemia    iron deficiency anemia - secondary to blood loss ( chronic)    Anxiety    Arthritis    endstage changes bilateral knees/bilateral ankles.    Asthma    Carotid artery occlusion    Chronic fatigue    Chronic kidney disease    Closed left hip fracture (HCC)    Clotting disorder ([place])    pt denies this   Contusion of left knee    due to fall 1/14.   COPD (chronic obstructive pulmonary disease) (HCC)    pulmonary fibrosis   Depression, reactive  Diabetes mellitus    type II    Diastolic dysfunction    Difficulty in walking    Family history of heart disease    Generalized muscle weakness    Gout    High cholesterol    History of falling    Hypertension    Hypothyroidism    Interstitial lung disease ([place])    Meningioma of left sphenoid wing involving cavernous sinus (HCC) 02/17/2012   Continue diplopia, left eye pain and left headaches.     Morbid obesity ([place])    Neuromuscular disorder ([place])    diabetic neuropathy    Normal coronary arteries    cardiac catheterization performed  10/31/14   Pneumonia    RA (rheumatoid arthritis) ([place])    has been off methotreaxte since 10/13.   Spinal stenosis of lumbar region    Thyroid disease    Unspecified lack of coordination    URI (upper respiratory infection)    There were no vitals taken for this visit.  Opioid Risk Score:   Fall Risk Score:  `1  Depression screen PHQ 2/9  Depression screen [facility] 2/9 05/22/2021 03/27/2021 02/13/2021 11/26/2020 10/18/2020 10/18/2020 09/24/2020  Decreased Interest 1 0 1 0 0 0 1  Down, Depressed, Hopeless 1 0 1 0 0 0 1  PHQ - 2 Score 2 0 2 0 0 0 2  Altered sleeping 2 - - - - - -   Tired, decreased energy 1 - - - - - -  Change in appetite 2 - - - - - -  Feeling bad or failure about yourself  0 - - - - - -  Trouble concentrating 0 - - - - - -  Moving slowly or fidgety/restless 0 - - - - - -  Suicidal thoughts 0 - - - - - -  PHQ-9 Score 7 - - - - - -  Difficult doing work/chores - - - - - - -  Some recent data might be hidden    Review of Systems  Musculoskeletal:  Positive for back pain.       Leg pain Hip pain  All other systems reviewed and are negative.     Objective:   Physical Exam Vitals and nursing note reviewed.  Constitutional:      Appearance: Normal appearance.  Neck:     Comments: Cervical Paraspinal Tenderness: C-5-C-6 Cardiovascular:     Rate and Rhythm: Normal rate and regular rhythm.     Pulses: Normal pulses.     Heart sounds: Normal heart sounds.  Pulmonary:     Effort: Pulmonary effort is normal.     Breath sounds: Normal breath sounds.     Comments: Continuous Oxygen: 3 Liters Nasal Cannula Musculoskeletal:     Cervical back: Normal range of motion and neck supple.     Comments: Normal Muscle Bulk and Muscle Testing Reveals:  Upper Extremities: Decreased ROM 45 Degrees and Muscle Strength 4/5 Thoracic and Lumbar Hypersensitivity Bilateral Greater Trochanter Tenderness Lower Extremities: Full ROM and Muscle Strength 5/5 Arrived in wheelchair Gait     Skin:    General: Skin is warm and dry.  Neurological:     Mental Status: She is alert and oriented to person, place, and time.  Psychiatric:        Mood and Affect: Mood normal.        Behavior: Behavior normal.         Assessment & Plan:  1. Lumbar spinal stenosis with neurogenic claudication.  Associated facet arthropathy/ Lumbar Radiculitis: Continue current medication regime with Gabapentin and HEP as tolerated. 11/26/2020 Refilled: MS Contin 15 mg one tablet every 12 hours #60 and  MSIR 15 one tablet twice a day as needed  For break through pain ( 6 am and 1:00 pm)   #60.since adverse effect with insomnia with Nucynta. Nucynta discontinued due to insomnia.  We will continue the opioid monitoring program, this consists of regular clinic visits, examinations, urine drug screen, pill counts as well as use of [place] Controlled Substance Reporting system. A 12 month History has been reviewed on the [place] Controlled Substance Reporting System 05/24/2021 2. Chronic Bilateral Thoracic Back Pain: Continue HEP as Tolerated. Continue to Monitor. 05/24/2021 3 Depression: Continue current medication Regime: Cymbalta . Continue to monitor.05/24/2021 4. Diabetes mellitus type 2 with polyneuropathy: Continue current medication regime: Gabapentin. 05/24/2021 5. Rheumatoid arthritis and osteoarthritis. Continue current medication Regime.  Voltaren Gel. Rheumatology Following. 05/24/2021 6. Interstitial lung disease: Pulmonology Following. 05/24/2021. 7. Bilateral Osteoarthritis Knee's:  Continue current medication regimen. Voltaren Gel. 05/24/2021. 8. Muscle Spasm: Continue current medication regime : Flexeril. Continue to monitor.05/24/2021 9. Bilateral Greater Trochanteric Tenderness: L>R. Continue current treatment with Ice/Heat Therapy. 05/24/2021. 10. Polyarthralgia: Rheumatology Following: Continue to monitor.05/24/2021 11. Morbid Obesitity: Continue Healthy Diet Regime and HEP. Continue to monitor.  05/24/2021 12. Right Shoulder tendonitis: No complaints today..Continue to Alternate with Heat and Ice Therapy. Continue to monitor. 05/24/2021     F/U in 2 months

## 2021-05-29 ENCOUNTER — Telehealth: Payer: Self-pay | Admitting: Family Medicine

## 2021-05-29 LAB — DRUG TOX MONITOR 1 W/CONF, ORAL FLD
Amphetamines: NEGATIVE ng/mL (ref <10)
Barbiturates: NEGATIVE ng/mL (ref <10)
Benzodiazepines: NEGATIVE ng/mL (ref <0.50)
Buprenorphine: NEGATIVE ng/mL (ref <0.10)
Cocaine: NEGATIVE ng/mL (ref <5.0)
Fentanyl: NEGATIVE ng/mL (ref <0.10)
Heroin Metabolite: NEGATIVE ng/mL (ref <1.0)
MARIJUANA: NEGATIVE ng/mL (ref <2.5)
MDMA: NEGATIVE ng/mL (ref <10)
Meprobamate: NEGATIVE ng/mL (ref <2.5)
Methadone: NEGATIVE ng/mL (ref <5.0)
Nicotine Metabolite: NEGATIVE ng/mL (ref <5.0)
Opiates: NEGATIVE ng/mL (ref <2.5)
Phencyclidine: NEGATIVE ng/mL (ref <10)
Tapentadol: NEGATIVE ng/mL (ref <5.0)
Tramadol: 7.1 ng/mL — ABNORMAL HIGH (ref <5.0)
Tramadol: POSITIVE ng/mL — AB (ref <5.0)
Zolpidem: NEGATIVE ng/mL (ref <5.0)

## 2021-05-29 LAB — DRUG TOX ALC METAB W/CON, ORAL FLD: Alcohol Metabolite: NEGATIVE ng/mL (ref <25)

## 2021-05-29 NOTE — Telephone Encounter (Signed)
Patient called in to see if Dr.Banks could prescribe her something other than Ozempic because she haven't been able to get it lately due to it being on backorder.  Patient could be contacted at [phone].  Please advise.

## 2021-05-30 ENCOUNTER — Telehealth: Payer: Self-pay

## 2021-05-31 ENCOUNTER — Other Ambulatory Visit: Payer: Self-pay | Admitting: Family Medicine

## 2021-05-31 DIAGNOSIS — E1140 Type 2 diabetes mellitus with diabetic neuropathy, unspecified: Secondary | ICD-10-CM

## 2021-05-31 MED ORDER — TRULICITY 0.75 MG/0.5ML ~~LOC~~ SOAJ: 0.7500 mg | SUBCUTANEOUS | 11 refills | Status: DC

## 2021-05-31 NOTE — Telephone Encounter (Signed)
A prescription for Trulicity 2.02 mg weekly was sent to your pharmacy as you are having trouble obtaining Ozempic.

## 2021-05-31 NOTE — Telephone Encounter (Signed)
Spoke with patient and have scheduled a Palliative f/u visit (post discharge from [facility]) for 06/18/21 @ 12:30 PM.

## 2021-05-31 NOTE — Progress Notes (Signed)
At patient request, a prescription for Trulicity 7.35 mg weekly sent to patient's pharmacy if you have any difficulty obtaining Ozempic 0.25 mg weekly.  Joan Mitts, MD

## 2021-06-02 DIAGNOSIS — N319 Neuromuscular dysfunction of bladder, unspecified: Secondary | ICD-10-CM | POA: Diagnosis not present

## 2021-06-02 DIAGNOSIS — K219 Gastro-esophageal reflux disease without esophagitis: Secondary | ICD-10-CM | POA: Diagnosis not present

## 2021-06-02 DIAGNOSIS — K5909 Other constipation: Secondary | ICD-10-CM | POA: Diagnosis not present

## 2021-06-02 DIAGNOSIS — J449 Chronic obstructive pulmonary disease, unspecified: Secondary | ICD-10-CM | POA: Diagnosis not present

## 2021-06-02 DIAGNOSIS — E1140 Type 2 diabetes mellitus with diabetic neuropathy, unspecified: Secondary | ICD-10-CM | POA: Diagnosis not present

## 2021-06-02 DIAGNOSIS — E039 Hypothyroidism, unspecified: Secondary | ICD-10-CM | POA: Diagnosis not present

## 2021-06-02 DIAGNOSIS — Z8616 Personal history of COVID-19: Secondary | ICD-10-CM | POA: Diagnosis not present

## 2021-06-02 DIAGNOSIS — I130 Hypertensive heart and chronic kidney disease with heart failure and stage 1 through stage 4 chronic kidney disease, or unspecified chronic kidney disease: Secondary | ICD-10-CM | POA: Diagnosis not present

## 2021-06-02 DIAGNOSIS — M05742 Rheumatoid arthritis with rheumatoid factor of left hand without organ or systems involvement: Secondary | ICD-10-CM | POA: Diagnosis not present

## 2021-06-02 DIAGNOSIS — G8929 Other chronic pain: Secondary | ICD-10-CM | POA: Diagnosis not present

## 2021-06-02 DIAGNOSIS — Z8701 Personal history of pneumonia (recurrent): Secondary | ICD-10-CM | POA: Diagnosis not present

## 2021-06-02 DIAGNOSIS — M1A09X0 Idiopathic chronic gout, multiple sites, without tophus (tophi): Secondary | ICD-10-CM | POA: Diagnosis not present

## 2021-06-02 DIAGNOSIS — I5033 Acute on chronic diastolic (congestive) heart failure: Secondary | ICD-10-CM | POA: Diagnosis not present

## 2021-06-02 DIAGNOSIS — M48061 Spinal stenosis, lumbar region without neurogenic claudication: Secondary | ICD-10-CM | POA: Diagnosis not present

## 2021-06-02 DIAGNOSIS — D689 Coagulation defect, unspecified: Secondary | ICD-10-CM | POA: Diagnosis not present

## 2021-06-02 DIAGNOSIS — L720 Epidermal cyst: Secondary | ICD-10-CM | POA: Diagnosis not present

## 2021-06-02 DIAGNOSIS — N183 Chronic kidney disease, stage 3 unspecified: Secondary | ICD-10-CM | POA: Diagnosis not present

## 2021-06-02 DIAGNOSIS — J849 Interstitial pulmonary disease, unspecified: Secondary | ICD-10-CM | POA: Diagnosis not present

## 2021-06-02 DIAGNOSIS — Z86011 Personal history of benign neoplasm of the brain: Secondary | ICD-10-CM | POA: Diagnosis not present

## 2021-06-02 DIAGNOSIS — E782 Mixed hyperlipidemia: Secondary | ICD-10-CM | POA: Diagnosis not present

## 2021-06-02 DIAGNOSIS — F419 Anxiety disorder, unspecified: Secondary | ICD-10-CM | POA: Diagnosis not present

## 2021-06-02 DIAGNOSIS — M05741 Rheumatoid arthritis with rheumatoid factor of right hand without organ or systems involvement: Secondary | ICD-10-CM | POA: Diagnosis not present

## 2021-06-02 DIAGNOSIS — D500 Iron deficiency anemia secondary to blood loss (chronic): Secondary | ICD-10-CM | POA: Diagnosis not present

## 2021-06-02 DIAGNOSIS — E1122 Type 2 diabetes mellitus with diabetic chronic kidney disease: Secondary | ICD-10-CM | POA: Diagnosis not present

## 2021-06-03 NOTE — Telephone Encounter (Signed)
Called pt, no answer. Left detailed vm per FYI.

## 2021-06-06 ENCOUNTER — Telehealth: Payer: Self-pay | Admitting: *Deleted

## 2021-06-06 DIAGNOSIS — I130 Hypertensive heart and chronic kidney disease with heart failure and stage 1 through stage 4 chronic kidney disease, or unspecified chronic kidney disease: Secondary | ICD-10-CM | POA: Diagnosis not present

## 2021-06-06 DIAGNOSIS — E1122 Type 2 diabetes mellitus with diabetic chronic kidney disease: Secondary | ICD-10-CM | POA: Diagnosis not present

## 2021-06-06 DIAGNOSIS — N183 Chronic kidney disease, stage 3 unspecified: Secondary | ICD-10-CM | POA: Diagnosis not present

## 2021-06-06 DIAGNOSIS — I5033 Acute on chronic diastolic (congestive) heart failure: Secondary | ICD-10-CM | POA: Diagnosis not present

## 2021-06-06 DIAGNOSIS — M05741 Rheumatoid arthritis with rheumatoid factor of right hand without organ or systems involvement: Secondary | ICD-10-CM | POA: Diagnosis not present

## 2021-06-06 DIAGNOSIS — J849 Interstitial pulmonary disease, unspecified: Secondary | ICD-10-CM | POA: Diagnosis not present

## 2021-06-06 NOTE — Telephone Encounter (Signed)
Oral swab is negative for morphine sulfate and positive for tramadol. She denies taking anything but her morphine which she reported taking same day as test. Her pills always look the same- no changes. She says there is no one in her home that takes or has any tramadol either.

## 2021-06-07 DIAGNOSIS — M05741 Rheumatoid arthritis with rheumatoid factor of right hand without organ or systems involvement: Secondary | ICD-10-CM | POA: Diagnosis not present

## 2021-06-07 DIAGNOSIS — J849 Interstitial pulmonary disease, unspecified: Secondary | ICD-10-CM | POA: Diagnosis not present

## 2021-06-07 DIAGNOSIS — I5033 Acute on chronic diastolic (congestive) heart failure: Secondary | ICD-10-CM | POA: Diagnosis not present

## 2021-06-07 DIAGNOSIS — E1122 Type 2 diabetes mellitus with diabetic chronic kidney disease: Secondary | ICD-10-CM | POA: Diagnosis not present

## 2021-06-07 DIAGNOSIS — I130 Hypertensive heart and chronic kidney disease with heart failure and stage 1 through stage 4 chronic kidney disease, or unspecified chronic kidney disease: Secondary | ICD-10-CM | POA: Diagnosis not present

## 2021-06-07 DIAGNOSIS — N183 Chronic kidney disease, stage 3 unspecified: Secondary | ICD-10-CM | POA: Diagnosis not present

## 2021-06-10 ENCOUNTER — Ambulatory Visit (HOSPITAL_COMMUNITY): Payer: Medicare Other | Attending: Internal Medicine

## 2021-06-10 ENCOUNTER — Other Ambulatory Visit: Payer: Self-pay

## 2021-06-10 DIAGNOSIS — J8489 Other specified interstitial pulmonary diseases: Secondary | ICD-10-CM | POA: Diagnosis not present

## 2021-06-10 DIAGNOSIS — I5032 Chronic diastolic (congestive) heart failure: Secondary | ICD-10-CM | POA: Insufficient documentation

## 2021-06-10 DIAGNOSIS — M359 Systemic involvement of connective tissue, unspecified: Secondary | ICD-10-CM | POA: Insufficient documentation

## 2021-06-10 LAB — ECHOCARDIOGRAM COMPLETE
Area-P 1/2: 2.32 cm2
S' Lateral: 2.6 cm

## 2021-06-11 ENCOUNTER — Telehealth: Payer: Self-pay | Admitting: Family Medicine

## 2021-06-11 NOTE — Telephone Encounter (Signed)
[name] from [facility] call and stated he need a verbal order for occupational therapy 1x a wk for 4 wk's  for pt .Joan KitchenElias Else # is [phone] he also stated you can leave a voice message if he don't answer .

## 2021-06-12 DIAGNOSIS — J849 Interstitial pulmonary disease, unspecified: Secondary | ICD-10-CM | POA: Diagnosis not present

## 2021-06-12 DIAGNOSIS — I5033 Acute on chronic diastolic (congestive) heart failure: Secondary | ICD-10-CM | POA: Diagnosis not present

## 2021-06-12 DIAGNOSIS — N183 Chronic kidney disease, stage 3 unspecified: Secondary | ICD-10-CM | POA: Diagnosis not present

## 2021-06-12 DIAGNOSIS — I130 Hypertensive heart and chronic kidney disease with heart failure and stage 1 through stage 4 chronic kidney disease, or unspecified chronic kidney disease: Secondary | ICD-10-CM | POA: Diagnosis not present

## 2021-06-12 DIAGNOSIS — M05741 Rheumatoid arthritis with rheumatoid factor of right hand without organ or systems involvement: Secondary | ICD-10-CM | POA: Diagnosis not present

## 2021-06-12 DIAGNOSIS — E1122 Type 2 diabetes mellitus with diabetic chronic kidney disease: Secondary | ICD-10-CM | POA: Diagnosis not present

## 2021-06-12 NOTE — Telephone Encounter (Signed)
Ok.

## 2021-06-13 ENCOUNTER — Telehealth: Payer: Self-pay | Admitting: Family Medicine

## 2021-06-13 DIAGNOSIS — I130 Hypertensive heart and chronic kidney disease with heart failure and stage 1 through stage 4 chronic kidney disease, or unspecified chronic kidney disease: Secondary | ICD-10-CM | POA: Diagnosis not present

## 2021-06-13 DIAGNOSIS — I5033 Acute on chronic diastolic (congestive) heart failure: Secondary | ICD-10-CM | POA: Diagnosis not present

## 2021-06-13 DIAGNOSIS — J849 Interstitial pulmonary disease, unspecified: Secondary | ICD-10-CM | POA: Diagnosis not present

## 2021-06-13 DIAGNOSIS — E1122 Type 2 diabetes mellitus with diabetic chronic kidney disease: Secondary | ICD-10-CM | POA: Diagnosis not present

## 2021-06-13 DIAGNOSIS — M05741 Rheumatoid arthritis with rheumatoid factor of right hand without organ or systems involvement: Secondary | ICD-10-CM | POA: Diagnosis not present

## 2021-06-13 DIAGNOSIS — E1140 Type 2 diabetes mellitus with diabetic neuropathy, unspecified: Secondary | ICD-10-CM

## 2021-06-13 DIAGNOSIS — N183 Chronic kidney disease, stage 3 unspecified: Secondary | ICD-10-CM | POA: Diagnosis not present

## 2021-06-13 MED ORDER — TRULICITY 0.75 MG/0.5ML ~~LOC~~ SOAJ: 0.7500 mg | SUBCUTANEOUS | 2 refills | Status: DC

## 2021-06-13 NOTE — Telephone Encounter (Signed)
I left Radovan the VO on his voicemail.

## 2021-06-13 NOTE — Telephone Encounter (Signed)
Rx sent in for 90 days with refills to mail order.

## 2021-06-13 NOTE — Telephone Encounter (Signed)
Patient is requesting for Dulaglutide (TRULICITY) 4.03 VV/3.3TJ SOPN [[id number]]  to be sent to her mail order pharmacy. Patient stated that it would need to be 90 day supply sent in order for insurance to accept it.  The pharmacy name and address is [facility], [place] - [address]  [address], [place] [address]  Phone:  [phone]  Fax:  [phone]   Patient could be contacted at [phone].  Please advise.

## 2021-06-14 DIAGNOSIS — N183 Chronic kidney disease, stage 3 unspecified: Secondary | ICD-10-CM | POA: Diagnosis not present

## 2021-06-14 DIAGNOSIS — I5033 Acute on chronic diastolic (congestive) heart failure: Secondary | ICD-10-CM | POA: Diagnosis not present

## 2021-06-14 DIAGNOSIS — M05741 Rheumatoid arthritis with rheumatoid factor of right hand without organ or systems involvement: Secondary | ICD-10-CM | POA: Diagnosis not present

## 2021-06-14 DIAGNOSIS — I130 Hypertensive heart and chronic kidney disease with heart failure and stage 1 through stage 4 chronic kidney disease, or unspecified chronic kidney disease: Secondary | ICD-10-CM | POA: Diagnosis not present

## 2021-06-14 DIAGNOSIS — J849 Interstitial pulmonary disease, unspecified: Secondary | ICD-10-CM | POA: Diagnosis not present

## 2021-06-14 DIAGNOSIS — E1122 Type 2 diabetes mellitus with diabetic chronic kidney disease: Secondary | ICD-10-CM | POA: Diagnosis not present

## 2021-06-18 ENCOUNTER — Other Ambulatory Visit: Payer: Self-pay

## 2021-06-18 ENCOUNTER — Other Ambulatory Visit: Payer: Medicare Other

## 2021-06-18 ENCOUNTER — Telehealth: Payer: Self-pay | Admitting: Registered Nurse

## 2021-06-18 VITALS — BP 152/100 | HR 100 | Temp 97.7°F | Wt 223.0 lb

## 2021-06-18 DIAGNOSIS — E1122 Type 2 diabetes mellitus with diabetic chronic kidney disease: Secondary | ICD-10-CM | POA: Diagnosis not present

## 2021-06-18 DIAGNOSIS — J849 Interstitial pulmonary disease, unspecified: Secondary | ICD-10-CM | POA: Diagnosis not present

## 2021-06-18 DIAGNOSIS — N183 Chronic kidney disease, stage 3 unspecified: Secondary | ICD-10-CM | POA: Diagnosis not present

## 2021-06-18 DIAGNOSIS — I5033 Acute on chronic diastolic (congestive) heart failure: Secondary | ICD-10-CM | POA: Diagnosis not present

## 2021-06-18 DIAGNOSIS — Z515 Encounter for palliative care: Secondary | ICD-10-CM

## 2021-06-18 DIAGNOSIS — I130 Hypertensive heart and chronic kidney disease with heart failure and stage 1 through stage 4 chronic kidney disease, or unspecified chronic kidney disease: Secondary | ICD-10-CM | POA: Diagnosis not present

## 2021-06-18 DIAGNOSIS — M05741 Rheumatoid arthritis with rheumatoid factor of right hand without organ or systems involvement: Secondary | ICD-10-CM | POA: Diagnosis not present

## 2021-06-18 MED ORDER — MORPHINE SULFATE ER 15 MG PO TBCR: 15.0000 mg | EXTENDED_RELEASE_TABLET | Freq: Two times a day (BID) | ORAL | 0 refills | Status: DC

## 2021-06-18 NOTE — Progress Notes (Signed)
PATIENT NAME: [name] DOB: [birth year] MRN: [id number]  PRIMARY CARE PROVIDER: [name], MD  RESPONSIBLE PARTY:  Acct ID - Guarantor Home Phone Work Phone Relationship Acct Type  [id number] LYNNET, [phone] Self P/F     [address], [name], [place] [address]    PLAN OF CARE and INTERVENTIONS:               1.  GOALS OF CARE/ ADVANCE CARE PLANNING:  Remain home with the assistance of her family.                2.  PATIENT/CAREGIVER EDUCATION:  ACP, CHF, s/s of UTI               4. PERSONAL EMERGENCY PLAN:  Activate 911 for emergencies.               5.  DISEASE STATUS:  ACP:  Discussion started on code status and MOST form.  Last Most form completed in 2018 and will need to be reviewed by provider.  Patient states she would like to be a DNR.  She does not currently have a DNR form in the home.  Will have Palliative Care NP follow up .   DM: Patient is checking her blood sugars 3x weekly and notes they range between 95-110.  She continues with her Trulicity as ordered.  Patient was previously under Ozempic but this was on back order with the [facility] for several months.    CHF:  Patient endorses daily weight checks.  She ranges between 222-224.  Patient remains on lasix 80 mg daily.  We discussed notifying cardiology if weight gain of 3 lbs in 24 hours or 5 lbs in 1 week.  Patient verbalized understanding.  Patient endorses chest pain but she is uncertain if this is cardiac related.  No issues with dizziness, nausea or vomiting reported.   Debility:  Patient is currently working with [facility].  She will have 2 more sessions with OT before this ends.  PT is seeing patient 2 x weekly and she is uncertain when those services will stop.  Patient is using a rolling walker in the home.  Denies any recent falls.   Neuropathy:  Patient endorses ongoing neuropathic pain to her bilateral lower extremities.  She continues with gabapentin.  Chronic Pain:   Patient is on routine morphine ER and IR morphine which has been helpful in managing her lower back pain and left leg pain related to spinal stenosis. She also uses a heating pad for pain.  Patient endorses being followed by the pain clinic.  UTI:  Discussed symptoms of a UTI but patient states she does no usually present with symptoms.  When testing is completed, is when she knows there is an infection present.    HISTORY OF PRESENT ILLNESS:   [name] is an 81 y.o. year old female with multiple medical problems including Chronic diastolic congestive heart failure, pulmonary fibrosis, interstitial lung disease, chronic respiratory failure with hypoxia, dysphasia, aphasia, carotid artery occlusion, COPD,  history of DVT, chronic kidney disease, Rheumatoid arthritis, anemia, meningioma of left sphenoid wing involving cavernous sinus, hypothyroidism, hypertension, gerd, spinal stenosis of lumbar region,  osteoarthritis, hypercholesterolemia, gout, morbid obesity, arthritis, cataract,  chronic pain, history of left hip fracture s/p nail, tonsillectomy, left shoulder surgery, ovarian surgery, brain surgery, abdominal hysterectomy.   Patient is being followed by Palliative Care every 4-8 weeks and PRN.  CODE STATUS: Desires DNR but  no form in the home. ADVANCED DIRECTIVES: No MOST FORM: Yes PPS: 50%   PHYSICAL EXAM:   VITALS: Today's Vitals   06/18/21 1306  BP: (!) 152/100  Pulse: 100  Temp: 97.7 F (36.5 C)  SpO2: 93%  Weight: 223 lb (101.2 kg)    LUNGS: clear to auscultation  CARDIAC: Cor RRR}  EXTREMITIES: trace edema SKIN: Skin color, texture, turgor normal. No rashes or lesions or lesions present to her face and neck that surfaced while in rehab.  No drainage or redness noted.  Patient will follow up with dermatology next month.   NEURO: positive for gait problems       Joan Burton, RN

## 2021-06-18 NOTE — Telephone Encounter (Signed)
PMP was Reviewed.  MS Contin: E Scribed Ms. Friese is aware via My-Chart.

## 2021-06-19 ENCOUNTER — Ambulatory Visit: Payer: Medicare Other | Admitting: Family Medicine

## 2021-06-19 ENCOUNTER — Ambulatory Visit
Admission: RE | Admit: 2021-06-19 | Discharge: 2021-06-19 | Disposition: A | Discharge status: Home / Self Care | Payer: Medicare Other | Source: Ambulatory Visit | Attending: Internal Medicine | Admitting: Internal Medicine

## 2021-06-19 DIAGNOSIS — M05741 Rheumatoid arthritis with rheumatoid factor of right hand without organ or systems involvement: Secondary | ICD-10-CM | POA: Diagnosis not present

## 2021-06-19 DIAGNOSIS — J8489 Other specified interstitial pulmonary diseases: Secondary | ICD-10-CM

## 2021-06-19 DIAGNOSIS — N183 Chronic kidney disease, stage 3 unspecified: Secondary | ICD-10-CM | POA: Diagnosis not present

## 2021-06-19 DIAGNOSIS — I700 Atherosclerosis of aorta: Secondary | ICD-10-CM | POA: Diagnosis not present

## 2021-06-19 DIAGNOSIS — J849 Interstitial pulmonary disease, unspecified: Secondary | ICD-10-CM | POA: Diagnosis not present

## 2021-06-19 DIAGNOSIS — R0602 Shortness of breath: Secondary | ICD-10-CM | POA: Diagnosis not present

## 2021-06-19 DIAGNOSIS — I5033 Acute on chronic diastolic (congestive) heart failure: Secondary | ICD-10-CM | POA: Diagnosis not present

## 2021-06-19 DIAGNOSIS — M359 Systemic involvement of connective tissue, unspecified: Secondary | ICD-10-CM

## 2021-06-19 DIAGNOSIS — I130 Hypertensive heart and chronic kidney disease with heart failure and stage 1 through stage 4 chronic kidney disease, or unspecified chronic kidney disease: Secondary | ICD-10-CM | POA: Diagnosis not present

## 2021-06-19 DIAGNOSIS — E1122 Type 2 diabetes mellitus with diabetic chronic kidney disease: Secondary | ICD-10-CM | POA: Diagnosis not present

## 2021-06-19 DIAGNOSIS — I2510 Atherosclerotic heart disease of native coronary artery without angina pectoris: Secondary | ICD-10-CM | POA: Diagnosis not present

## 2021-06-20 DIAGNOSIS — I130 Hypertensive heart and chronic kidney disease with heart failure and stage 1 through stage 4 chronic kidney disease, or unspecified chronic kidney disease: Secondary | ICD-10-CM | POA: Diagnosis not present

## 2021-06-20 DIAGNOSIS — M05741 Rheumatoid arthritis with rheumatoid factor of right hand without organ or systems involvement: Secondary | ICD-10-CM | POA: Diagnosis not present

## 2021-06-20 DIAGNOSIS — I5033 Acute on chronic diastolic (congestive) heart failure: Secondary | ICD-10-CM | POA: Diagnosis not present

## 2021-06-20 DIAGNOSIS — E1122 Type 2 diabetes mellitus with diabetic chronic kidney disease: Secondary | ICD-10-CM | POA: Diagnosis not present

## 2021-06-20 DIAGNOSIS — N183 Chronic kidney disease, stage 3 unspecified: Secondary | ICD-10-CM | POA: Diagnosis not present

## 2021-06-20 DIAGNOSIS — J849 Interstitial pulmonary disease, unspecified: Secondary | ICD-10-CM | POA: Diagnosis not present

## 2021-06-21 ENCOUNTER — Encounter: Payer: Self-pay | Admitting: Family Medicine

## 2021-06-21 ENCOUNTER — Ambulatory Visit (INDEPENDENT_AMBULATORY_CARE_PROVIDER_SITE_OTHER): Payer: Medicare Other | Admitting: Family Medicine

## 2021-06-21 VITALS — BP 156/92 | HR 93 | Temp 98.2°F | Ht 64.0 in

## 2021-06-21 DIAGNOSIS — M05741 Rheumatoid arthritis with rheumatoid factor of right hand without organ or systems involvement: Secondary | ICD-10-CM

## 2021-06-21 DIAGNOSIS — M05742 Rheumatoid arthritis with rheumatoid factor of left hand without organ or systems involvement: Secondary | ICD-10-CM | POA: Diagnosis not present

## 2021-06-21 DIAGNOSIS — M545 Low back pain, unspecified: Secondary | ICD-10-CM | POA: Diagnosis not present

## 2021-06-21 DIAGNOSIS — M25552 Pain in left hip: Secondary | ICD-10-CM | POA: Diagnosis not present

## 2021-06-21 DIAGNOSIS — G8929 Other chronic pain: Secondary | ICD-10-CM | POA: Diagnosis not present

## 2021-06-21 DIAGNOSIS — I5032 Chronic diastolic (congestive) heart failure: Secondary | ICD-10-CM

## 2021-06-21 DIAGNOSIS — J849 Interstitial pulmonary disease, unspecified: Secondary | ICD-10-CM | POA: Diagnosis not present

## 2021-06-21 MED ORDER — PREDNISONE 10 MG PO TABS: ORAL_TABLET | ORAL | 0 refills | Status: DC

## 2021-06-21 NOTE — Patient Instructions (Addendum)
We discussed increasing you prednisone for the new 4 wks.  A new prescription was sent to your pharmacy.  Take 2 tabs (20 mg) daily x 14 days. Then take 1 and a half tabs (15 mg) x 14 days.  Then return to your regular dose of 1 tab (10 mg) daily.  [facility] [name] [phone] [address], [place] [address]

## 2021-06-21 NOTE — Progress Notes (Signed)
Subjective:    Patient ID: [name], female    DOB: 1940-11-14, 81 y.o.   MRN: [id number]  Chief Complaint  Patient presents with   Follow-up  Patient accompanied by her daughter [name].  HPI Patient was seen today for f/u.  Pt notes improvement in breathing.  Using 3 L during the day as needed.  Denies LE edema.  Endorses elevated BP in a.m. 2/2 pain and low back and left lateral hip.  Pain and blood pressure improved toward the end of the day.  Patient took medications this morning but states they take a few hours before they start working.  Patient undergoing work-up with pulmonology to assess lung function status post COVID-19 virus infection in December 2022.  Patient working with physical therapy, and OT.  Endorses decreased mobility of right fourth digit since prednisone was inadvertently stopped while in rehab.  Since restarting prednisone 10 mg daily patient notes continued inability to fully extend finger without assistance.  Doing hand exercises daily.  Patient notes the cyst on the right side of her face has increased in size.  Now has a new cyst on left cheek and posterior left neck that are increasing in size.  Has an appointment with dermatology in a few weeks.  Past Medical History:  Diagnosis Date   Acute on chronic diastolic (congestive) heart failure (HCC)    Anemia    iron deficiency anemia - secondary to blood loss ( chronic)    Anxiety    Arthritis    endstage changes bilateral knees/bilateral ankles.    Asthma    Carotid artery occlusion    Chronic fatigue    Chronic kidney disease    Closed left hip fracture (HCC)    Clotting disorder ([place])    pt denies this   Contusion of left knee    due to fall 1/14.   COPD (chronic obstructive pulmonary disease) (HCC)    pulmonary fibrosis   Depression, reactive    Diabetes mellitus    type II    Diastolic dysfunction    Difficulty in walking    Family history of heart disease    Generalized muscle weakness     Gout    High cholesterol    History of falling    Hypertension    Hypothyroidism    Interstitial lung disease ([place])    Meningioma of left sphenoid wing involving cavernous sinus ([place]) 02/17/2012   Continue diplopia, left eye pain and left headaches.     Morbid obesity ([place])    Neuromuscular disorder ([place])    diabetic neuropathy    Normal coronary arteries    cardiac catheterization performed  10/31/14   Pneumonia    RA (rheumatoid arthritis) ([place])    has been off methotreaxte since 10/13.   Spinal stenosis of lumbar region    Thyroid disease    Unspecified lack of coordination    URI (upper respiratory infection)     Allergies  Allergen Reactions   Codeine Swelling and Other (See Comments)    Facial swelling Chest pain and swelling in legs  Facial swelling   Infliximab Anaphylaxis    "sent me into shock" "sent me into shock"   Lisinopril Swelling    Face and neck swelling Face and neck swelling    ROS General: Denies fever, chills, night sweats, changes in weight, changes in appetite HEENT: Denies headaches, ear pain, changes in vision, rhinorrhea, sore throat CV: Denies CP, palpitations, SOB, orthopnea  Pulm: Denies  SOB, cough, wheezing GI: Denies abdominal pain, nausea, vomiting, diarrhea, constipation GU: Denies dysuria, hematuria, frequency, vaginal discharge Msk: Denies muscle cramps, joint pains + deformity of fingers, joint pain Neuro: Denies weakness, numbness, tingling Skin: Denies rashes, bruising  + cyst on face Psych: Denies depression, anxiety, hallucinations     Objective:    Blood pressure (!) 156/92, pulse 93, temperature 98.2 F (36.8 C), temperature source Oral, height 5' 4" (1.626 m), SpO2 94 %.  Gen. Pleasant, well-nourished, in no distress, normal affect   HEENT: [place]/AT, face symmetric, conjunctiva clear, no scleral icterus, PERRLA, EOMI, nares patent without drainage Lungs: no accessory muscle use, CTAB, no wheezes or rales Cardiovascular:  RRR, no m/r/g, no peripheral edema Musculoskeletal: Partial flexion of right fourth digit and edema of MCP joint.  TTP of right palm and thickening of tendon going to the right fourth digit.  No cyanosis or clubbing, normal tone Neuro:  A&Ox3, CN II-XII intact, normal gait Skin:  Warm, dry, intact.  Hyperpigmentation of left cheek with several cystic lesions on L cheek, right cheek lateral to right eye, and posterior neck.   Wt Readings from Last 3 Encounters:  06/18/21 223 lb (101.2 kg)  05/23/21 224 lb 3.2 oz (101.7 kg)  05/08/21 229 lb 8 oz (104.1 kg)    Lab Results  Component Value Date   WBC 8.4 05/22/2021   HGB 11.7 (L) 05/22/2021   HCT 37.1 05/22/2021   PLT 195.0 05/22/2021   GLUCOSE 94 05/22/2021   CHOL 136 09/26/2020   TRIG 283.0 (H) 09/26/2020   HDL 40.00 09/26/2020   LDLDIRECT 59.0 09/26/2020   LDLCALC 75 09/22/2019   ALT 18 05/22/2021   AST 16 05/22/2021   NA 143 05/22/2021   K 3.3 (L) 05/22/2021   CL 103 05/22/2021   CREATININE 0.82 05/22/2021   BUN 15 05/22/2021   CO2 32 05/22/2021   TSH 4.598 (H) 01/16/2021   INR 1.0 03/30/2021   HGBA1C 6.5 (H) 04/02/2021    Assessment/Plan:  Rheumatoid arthritis involving both hands with positive rheumatoid factor (HCC) -Increase symptoms since change in chronic prednisone dose December 2022. -We will increase prednisone from 10 mg to 20 mg daily x 2 weeks, then 15 mg daily x 2 weeks, then remain on 10 mg daily. -Encouraged to follow-up with rheumatologist.  Appointment in March.  - Plan: predniSONE (DELTASONE) 10 MG tablet  ILD (interstitial lung disease) (HCC) -Stable -Continue 3 L O2 via [place] -Continue current medications -Continue follow-up with pulmonology locally and at [facility]  Chronic diastolic CHF (congestive heart failure) (HCC) -Stable -Euvolemic on exam -Continue current medications -Continue follow-up with cardiology  Chronic bilateral low back pain without sciatica -Would be hesitant to increase  current pain medication as in the past contributed to hospitalization -Discussed alternative pain management options such as returning to water aerobics and acupuncture. -Continue current medication regimen -Continue follow-up with pain management  Pain in left hip -Increased in a.m. -Continue current pain meds -Continue follow-up with pain management  F/u as needed in 2 months  Grier Mitts, MD

## 2021-06-24 ENCOUNTER — Other Ambulatory Visit: Payer: Self-pay | Admitting: Family Medicine

## 2021-06-24 ENCOUNTER — Other Ambulatory Visit: Payer: Medicare Other | Admitting: Nurse Practitioner

## 2021-06-24 ENCOUNTER — Telehealth: Payer: Self-pay | Admitting: Family Medicine

## 2021-06-24 ENCOUNTER — Other Ambulatory Visit: Payer: Self-pay

## 2021-06-24 ENCOUNTER — Encounter: Payer: Self-pay | Admitting: Nurse Practitioner

## 2021-06-24 DIAGNOSIS — Z515 Encounter for palliative care: Secondary | ICD-10-CM

## 2021-06-24 DIAGNOSIS — E1122 Type 2 diabetes mellitus with diabetic chronic kidney disease: Secondary | ICD-10-CM | POA: Diagnosis not present

## 2021-06-24 DIAGNOSIS — M05741 Rheumatoid arthritis with rheumatoid factor of right hand without organ or systems involvement: Secondary | ICD-10-CM | POA: Diagnosis not present

## 2021-06-24 DIAGNOSIS — J849 Interstitial pulmonary disease, unspecified: Secondary | ICD-10-CM | POA: Diagnosis not present

## 2021-06-24 DIAGNOSIS — I5032 Chronic diastolic (congestive) heart failure: Secondary | ICD-10-CM

## 2021-06-24 DIAGNOSIS — K5909 Other constipation: Secondary | ICD-10-CM

## 2021-06-24 DIAGNOSIS — R5381 Other malaise: Secondary | ICD-10-CM

## 2021-06-24 DIAGNOSIS — I5033 Acute on chronic diastolic (congestive) heart failure: Secondary | ICD-10-CM | POA: Diagnosis not present

## 2021-06-24 DIAGNOSIS — R0602 Shortness of breath: Secondary | ICD-10-CM

## 2021-06-24 DIAGNOSIS — I130 Hypertensive heart and chronic kidney disease with heart failure and stage 1 through stage 4 chronic kidney disease, or unspecified chronic kidney disease: Secondary | ICD-10-CM | POA: Diagnosis not present

## 2021-06-24 DIAGNOSIS — N183 Chronic kidney disease, stage 3 unspecified: Secondary | ICD-10-CM | POA: Diagnosis not present

## 2021-06-24 NOTE — Telephone Encounter (Signed)
Ok.

## 2021-06-24 NOTE — Progress Notes (Signed)
[facility] Consult Note Telephone: [phone]  Fax: [phone]    Date of encounter: 06/24/21 8:32 PM PATIENT NAME: [name] [address] [place] [address]   [phone] (home)  DOB: 16-Jun-1940 MRN: [id number] PRIMARY CARE PROVIDER:    [name], MD,  [facility] [place] [address] [phone]  RESPONSIBLE PARTY:    Contact Information     Name Relation Home Work Mobile   Farrow,Curtis Son [phone]  [phone]   Linna Darner [phone]  [phone]   Pretty, Weltman Spouse [phone]  [phone]   Shelly Coss Daughter   [phone]   Jerelyn Scott Daughter   [phone]      Due to the COVID-19 crisis, this visit was done via telemedicine from my office and it was initiated and consent by this patient and or family.  I connected with  [name] OR PROXY on 06/24/21 by a telephone as video not available enabled telemedicine application and verified that I am speaking with the correct person using two identifiers.   I discussed the limitations of evaluation and management by telemedicine. The patient expressed understanding and agreed to proceed. Palliative Care was asked to follow this patient by consultation request of  Joan Ruddy, MD to address advance care planning and complex medical decision making. This is a follow up visit.                                   ASSESSMENT AND PLAN / RECOMMENDATIONS:   Advance Care Planning/Goals of Care: Goals include to maximize quality of life and symptom management. Patient/health care surrogate gave his/her permission to discuss. Our advance care planning conversation included a discussion about:    The value and importance of advance care planning  Experiences with loved ones who have been seriously ill or have died  Exploration of personal, cultural or spiritual beliefs that might influence medical  decisions  Exploration of goals of care in the event of a sudden injury or illness  Identification of a healthcare agent  Review and updating or creation of an  advance directive document . Decision not to resuscitate or to de-escalate disease focused treatments due to poor prognosis. CODE STATUS: DNR  Symptom Management/Plan: 1. ACP: Discussed code status, wishes are to be DNR, golden rod form completed, will put in vynca, send to [name]. Treat what is treatable    2. Debility secondary to deconditioning from recent hospitalization, progression of pulmonary fibrosis, chronic bronchiectasis with chronic hypoxic respiratory failure; CHF, RA; continue current regimen, pain management; encourage mobility with safety, fall risk.    3. Palliative care encounter Palliative medicine team will continue to support patient, patient's family, and medical team. Visit consisted of counseling and education dealing with the complex and emotionally intense issues of symptom management and palliative care in the setting of serious and potentially life-threatening    4. Shortness of breath secondary to pulmonary fibrosis; CHF, COPD, continue to repeat walking testing to obtain O2 for home, inhalation therapy, monitor respiratory status, monitor edema, activity level, energy conservation  Follow up Palliative Care Visit: Palliative care will continue to follow for complex medical decision making, advance care planning, and clarification of goals. Return 8 weeks or prn.  I spent 35 minutes providing this consultation. More than 50% of the time in this consultation was spent in counseling and care coordination. PPS: 50%  Chief Complaint: Follow up palliative consult for complex medical decision making  HISTORY OF PRESENT ILLNESS:  [name] is a 81 y.o. year old female  with multiple medical problems including Chronic diastolic congestive heart failure, pulmonary fibrosis, interstitial lung disease,  chronic respiratory failure with hypoxia, dysphasia, aphasia, carotid artery occlusion, COPD,  history of DVT, chronic kidney disease, Rheumatoid arthritis, anemia, meningioma of left sphenoid wing involving cavernous sinus, hypothyroidism, hypertension, gerd, spinal stenosis of lumbar region,  osteoarthritis, hypercholesterolemia, gout, morbid obesity, arthritis, cataract,  chronic pain, history of left hip fracture s/p nail, tonsillectomy, left shoulder surgery, ovarian surgery, brain surgery, abdominal hysterectomy. I called [name] for telemedicine, telephonic as video not available. We talked about how [name] has been feeling. [name] endorses she continues to be very tired, fatigue, weak from hospitalization continuing to have episodes of shortness of breath.  We talked about symptoms of pain, chronically, current regimen. We talked about appetite which remains declined. We talked about sleep patterns, sleep hygiene. We talked about her daily routine though has changed to incorporate more times of resting. We talked about medical goals, reviewed. We talked about chronic disease progression, realistic expectations with overall decline, debility, limitations including age. We talked about quality of life. We talked about role pc in poc. We talked about f/u pc visit with upcoming appointments. [name] in agreement prefers telemedicine as she gets tired with in person visits. Scheduled. Therapeutic listening, emotional support provided. Questions answered.   History obtained from review of EMR, discussion with [name].  I reviewed available labs, medications, imaging, studies and related documents from the EMR.  Records reviewed and summarized above.   ROS 10 point system reviewed all negative except HPI  Physical Exam: deferred  Thank you for the opportunity to participate in the care of [name].  The palliative care team will continue to follow. Please call our office at  [phone] if we can be of additional assistance.   Phaedra Colgate Ihor Gully, NP

## 2021-06-24 NOTE — Telephone Encounter (Signed)
FYI

## 2021-06-24 NOTE — Telephone Encounter (Signed)
Radovan (OT) with [facility] called in stating that patient is going to be discharged on 02/06 from occupational therapy. The patient met goals for occupational therapy but she is still going to be doing physical therapy.  If you have any questions you could contact [facility] at [phone].  Please advise.

## 2021-06-25 DIAGNOSIS — I130 Hypertensive heart and chronic kidney disease with heart failure and stage 1 through stage 4 chronic kidney disease, or unspecified chronic kidney disease: Secondary | ICD-10-CM | POA: Diagnosis not present

## 2021-06-25 DIAGNOSIS — J849 Interstitial pulmonary disease, unspecified: Secondary | ICD-10-CM | POA: Diagnosis not present

## 2021-06-25 DIAGNOSIS — N183 Chronic kidney disease, stage 3 unspecified: Secondary | ICD-10-CM | POA: Diagnosis not present

## 2021-06-25 DIAGNOSIS — M05741 Rheumatoid arthritis with rheumatoid factor of right hand without organ or systems involvement: Secondary | ICD-10-CM | POA: Diagnosis not present

## 2021-06-25 DIAGNOSIS — E1122 Type 2 diabetes mellitus with diabetic chronic kidney disease: Secondary | ICD-10-CM | POA: Diagnosis not present

## 2021-06-25 DIAGNOSIS — I5033 Acute on chronic diastolic (congestive) heart failure: Secondary | ICD-10-CM | POA: Diagnosis not present

## 2021-06-27 ENCOUNTER — Other Ambulatory Visit: Payer: Self-pay | Admitting: Adult Health

## 2021-06-27 DIAGNOSIS — I130 Hypertensive heart and chronic kidney disease with heart failure and stage 1 through stage 4 chronic kidney disease, or unspecified chronic kidney disease: Secondary | ICD-10-CM | POA: Diagnosis not present

## 2021-06-27 DIAGNOSIS — J849 Interstitial pulmonary disease, unspecified: Secondary | ICD-10-CM | POA: Diagnosis not present

## 2021-06-27 DIAGNOSIS — I5033 Acute on chronic diastolic (congestive) heart failure: Secondary | ICD-10-CM | POA: Diagnosis not present

## 2021-06-27 DIAGNOSIS — N183 Chronic kidney disease, stage 3 unspecified: Secondary | ICD-10-CM | POA: Diagnosis not present

## 2021-06-27 DIAGNOSIS — E1122 Type 2 diabetes mellitus with diabetic chronic kidney disease: Secondary | ICD-10-CM | POA: Diagnosis not present

## 2021-06-27 DIAGNOSIS — M05741 Rheumatoid arthritis with rheumatoid factor of right hand without organ or systems involvement: Secondary | ICD-10-CM | POA: Diagnosis not present

## 2021-06-27 DIAGNOSIS — I10 Essential (primary) hypertension: Secondary | ICD-10-CM

## 2021-07-01 DIAGNOSIS — L728 Other follicular cysts of the skin and subcutaneous tissue: Secondary | ICD-10-CM | POA: Diagnosis not present

## 2021-07-02 DIAGNOSIS — F419 Anxiety disorder, unspecified: Secondary | ICD-10-CM | POA: Diagnosis not present

## 2021-07-02 DIAGNOSIS — I130 Hypertensive heart and chronic kidney disease with heart failure and stage 1 through stage 4 chronic kidney disease, or unspecified chronic kidney disease: Secondary | ICD-10-CM | POA: Diagnosis not present

## 2021-07-02 DIAGNOSIS — N319 Neuromuscular dysfunction of bladder, unspecified: Secondary | ICD-10-CM | POA: Diagnosis not present

## 2021-07-02 DIAGNOSIS — E1122 Type 2 diabetes mellitus with diabetic chronic kidney disease: Secondary | ICD-10-CM | POA: Diagnosis not present

## 2021-07-02 DIAGNOSIS — D500 Iron deficiency anemia secondary to blood loss (chronic): Secondary | ICD-10-CM | POA: Diagnosis not present

## 2021-07-02 DIAGNOSIS — J849 Interstitial pulmonary disease, unspecified: Secondary | ICD-10-CM | POA: Diagnosis not present

## 2021-07-02 DIAGNOSIS — G8929 Other chronic pain: Secondary | ICD-10-CM | POA: Diagnosis not present

## 2021-07-02 DIAGNOSIS — J449 Chronic obstructive pulmonary disease, unspecified: Secondary | ICD-10-CM | POA: Diagnosis not present

## 2021-07-02 DIAGNOSIS — I5033 Acute on chronic diastolic (congestive) heart failure: Secondary | ICD-10-CM | POA: Diagnosis not present

## 2021-07-02 DIAGNOSIS — E782 Mixed hyperlipidemia: Secondary | ICD-10-CM | POA: Diagnosis not present

## 2021-07-02 DIAGNOSIS — Z8701 Personal history of pneumonia (recurrent): Secondary | ICD-10-CM | POA: Diagnosis not present

## 2021-07-02 DIAGNOSIS — E039 Hypothyroidism, unspecified: Secondary | ICD-10-CM | POA: Diagnosis not present

## 2021-07-02 DIAGNOSIS — E1140 Type 2 diabetes mellitus with diabetic neuropathy, unspecified: Secondary | ICD-10-CM | POA: Diagnosis not present

## 2021-07-02 DIAGNOSIS — M05742 Rheumatoid arthritis with rheumatoid factor of left hand without organ or systems involvement: Secondary | ICD-10-CM | POA: Diagnosis not present

## 2021-07-02 DIAGNOSIS — N183 Chronic kidney disease, stage 3 unspecified: Secondary | ICD-10-CM | POA: Diagnosis not present

## 2021-07-02 DIAGNOSIS — D689 Coagulation defect, unspecified: Secondary | ICD-10-CM | POA: Diagnosis not present

## 2021-07-02 DIAGNOSIS — Z86011 Personal history of benign neoplasm of the brain: Secondary | ICD-10-CM | POA: Diagnosis not present

## 2021-07-02 DIAGNOSIS — K5909 Other constipation: Secondary | ICD-10-CM | POA: Diagnosis not present

## 2021-07-02 DIAGNOSIS — L720 Epidermal cyst: Secondary | ICD-10-CM | POA: Diagnosis not present

## 2021-07-02 DIAGNOSIS — M1A09X0 Idiopathic chronic gout, multiple sites, without tophus (tophi): Secondary | ICD-10-CM | POA: Diagnosis not present

## 2021-07-02 DIAGNOSIS — M05741 Rheumatoid arthritis with rheumatoid factor of right hand without organ or systems involvement: Secondary | ICD-10-CM | POA: Diagnosis not present

## 2021-07-02 DIAGNOSIS — K219 Gastro-esophageal reflux disease without esophagitis: Secondary | ICD-10-CM | POA: Diagnosis not present

## 2021-07-02 DIAGNOSIS — Z8616 Personal history of COVID-19: Secondary | ICD-10-CM | POA: Diagnosis not present

## 2021-07-02 DIAGNOSIS — M48061 Spinal stenosis, lumbar region without neurogenic claudication: Secondary | ICD-10-CM | POA: Diagnosis not present

## 2021-07-03 DIAGNOSIS — R208 Other disturbances of skin sensation: Secondary | ICD-10-CM | POA: Diagnosis not present

## 2021-07-03 DIAGNOSIS — L538 Other specified erythematous conditions: Secondary | ICD-10-CM | POA: Diagnosis not present

## 2021-07-03 DIAGNOSIS — L728 Other follicular cysts of the skin and subcutaneous tissue: Secondary | ICD-10-CM | POA: Diagnosis not present

## 2021-07-03 DIAGNOSIS — L298 Other pruritus: Secondary | ICD-10-CM | POA: Diagnosis not present

## 2021-07-03 DIAGNOSIS — L723 Sebaceous cyst: Secondary | ICD-10-CM | POA: Diagnosis not present

## 2021-07-10 DIAGNOSIS — L298 Other pruritus: Secondary | ICD-10-CM | POA: Diagnosis not present

## 2021-07-10 DIAGNOSIS — L720 Epidermal cyst: Secondary | ICD-10-CM | POA: Diagnosis not present

## 2021-07-10 DIAGNOSIS — L728 Other follicular cysts of the skin and subcutaneous tissue: Secondary | ICD-10-CM | POA: Diagnosis not present

## 2021-07-12 ENCOUNTER — Other Ambulatory Visit: Payer: Self-pay

## 2021-07-12 ENCOUNTER — Ambulatory Visit (INDEPENDENT_AMBULATORY_CARE_PROVIDER_SITE_OTHER): Payer: Medicare Other | Admitting: Internal Medicine

## 2021-07-12 ENCOUNTER — Telehealth: Payer: Self-pay | Admitting: Internal Medicine

## 2021-07-12 ENCOUNTER — Encounter: Payer: Self-pay | Admitting: Internal Medicine

## 2021-07-12 VITALS — BP 132/80 | HR 90 | Temp 98.1°F | Ht 62.0 in | Wt 224.0 lb

## 2021-07-12 DIAGNOSIS — Z7952 Long term (current) use of systemic steroids: Secondary | ICD-10-CM | POA: Diagnosis not present

## 2021-07-12 DIAGNOSIS — J8489 Other specified interstitial pulmonary diseases: Secondary | ICD-10-CM

## 2021-07-12 DIAGNOSIS — M359 Systemic involvement of connective tissue, unspecified: Secondary | ICD-10-CM | POA: Diagnosis not present

## 2021-07-12 DIAGNOSIS — J9611 Chronic respiratory failure with hypoxia: Secondary | ICD-10-CM

## 2021-07-12 LAB — PULMONARY FUNCTION TEST
DL/VA % pred: 29 %
DL/VA: 1.23 ml/min/mmHg/L
DLCO cor % pred: 0 %
DLCO cor: -0.03 ml/min/mmHg
DLCO unc % pred: 0 %
DLCO unc: -0.03 ml/min/mmHg
FEF 25-75 Pre: 2.95 L/sec
FEF2575-%Pred-Pre: 243 %
FEV1-%Pred-Pre: 83 %
FEV1-Pre: 1.18 L
FEV1FVC-%Pred-Pre: 121 %
FEV6-%Pred-Pre: 73 %
FEV6-Pre: 1.29 L
FEV6FVC-%Pred-Pre: 105 %
FVC-%Pred-Pre: 70 %
FVC-Pre: 1.29 L
Pre FEV1/FVC ratio: 92 %
Pre FEV6/FVC Ratio: 100 %

## 2021-07-12 NOTE — Telephone Encounter (Signed)
Hi [name] consider evaluation for Right heart cath for [facility] . She is patient of yours on 02. Has ILD and RA and might qualify for tyvaso with Group 3 PAH if diagnosed  Thanks    SIGNATURE    Dr. Brand Males, M.D., F.C.C.P,  Pulmonary and Critical Care Medicine Staff Physician, [place] [facility] Director - Interstitial Lung Disease  Program  [facility] [place], [place], [address]  NPI Number:  NPI #[id number] [facility] Number: [id number]  Pager: [phone], If no answer  -> Check AMION or Try [phone] Telephone (clinical office): [phone] Telephone (research): [phone]  4:35 PM 07/12/2021

## 2021-07-12 NOTE — Patient Instructions (Addendum)
ICD-10-CM   1. 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 History of rheumatoid arthritis  Z87.39     3. Immunosuppression due to drug therapy ([place])  D84.821    Z79.899     4. Current chronic use of systemic steroids  Z79.52      Restage your lungs  after covid in Nov 2022 suggests that lungs might be worse based on symptoms,  your own o2 use, PFT and radiology report   Plan  - I have sent message to Dr Gwenlyn Found to consider right heart catheterization - I will discuss in our case conference you images [either in March or in April 2023] -about progression and also type of findings  -If indeed in the case conference also the appreciate progression then we can prescribe nintedanib which is an antifibrotic medication that we discussed -Take ILD-Pro registry consent with you  -Someone possibly Leda Gauze from [facility] will call you if you qualif  Followup  -Third or fourth week of April 2023 to discuss further

## 2021-07-12 NOTE — Progress Notes (Signed)
Spirometry and DLCO completed today  ?

## 2021-07-12 NOTE — Progress Notes (Signed)
OV 01/03/2015  Chief Complaint  Patient presents with   Follow-up    Pt c/o DOE, resolving cough that started over the weekend, chest discomfort when SOB and when lying down. Pt c/o increasing fatigue.    She has severe class 3 dyspnea with hypoxemia on account of BMI 50, diaast dsyfn, chronic pain, deconditioning and ILD due to RA on celcept/pred/bactrim   I personally last saw her in march 2-016. Since then has seen other provicers in this office and most recently in June 2016. In June 2016 admitted for acute CHF - cath showed mild pulm htn only with PA mean 24 and minimal CAD (reviewed chart of procedures June 2016). Dx as acute diast chf. Since then better. SHe had fu HRCT 11/29/14 that shows progression in ILD since march 2016 and now with UIP pattern (personally visualized image). She is finally accepting of fact that ILD is not makin cause of dyspnea and obesity is main problem. She is wondering about dc cellcept/pred/bactrim so as to minimize polypharmacy and side effect profile    OV 02/09/2015  Chief Complaint  Patient presents with   Follow-up    Pt states her breathing is doing well the past few days. Pt recently went to [facility] ED for CP. Pt c/o prod cough with tan mucus. Pt denies CP/tightness    She has severe class 3 dyspnea with hypoxemia on account of BMI 50, diaast dsyfn, chronic pain, deconditioning and ILD due to RA. She is having progressive ILD. But given her miserable quality of life and polypharmacy and other side effects we decided to stop the CellCept and Bactrim. We also advise prednisone taper. This was all in the end of August 2016. But on 01/27/2015 she ended up in the emergency room with some respiratory difficulty. Her prednisone was bumped up. She subsequently saw her rheumatologist [name] was now tapering her prednisone again. There is also some concerns of headaches and possibly temporal arteritis based on her description and apparently some kind of a  biopsy scheduled. She feels that since stopping her CellCept and Bactrim her cough is returned. Nevertheless weight and knee pain and back pain continued to be major issues for her along with polypharmacy. Her Polysorb was includes MS Contin  [name]    OV 02/21/2015  - a ACUTE VISIT  Chief Complaint  Patient presents with   Acute Visit    Pt c/o of chest tightness, productive cough with white/yellow mucus, and wheezing. Pt also c/o of feeling abdominal swelling/distention. Pt also c/o of constant fatigue. Pt states symptoms have been present x1 week.     Acute visit for this morbidly obese, rheumatoid arthritis patient with ILD who is now off CellCept  I saw her as recently as 02/09/2015. She reports that for the past 1 week she's having increased chest congestion, chest tightness, wheezing, shortness of breath, cough and yellow  sputum more than baseline. Symptoms are rated as moderate in severity. Relieved by inhaler. She has tried Mucinex for 1 week with some initial partial relief but symptoms progressed. So she made an acute visit.  She continues to stay off CellCept. She is not on prednisone 10 mg per day. She tells me [name] has held her prednisone at the dose because of concerns of temporal arteritis but apparently now that his diagnoses being eliminated in the differential diagnosis. She will see [name] in the next few weeks to decide on  further prednisone taper.    OV 04/16/2015  Chief Complaint  Patient presents with   Follow-up    Pt states she felt much better after completing the abx and pred. Pt states her SOB is at basline, prod cough with yellow mucus at times, pt c/o chest tightness.     Morbidly obese female with rheumatoid arthritis with interstitial lung disease UIP pattern with associated deconditioning and diastolic dysfunction  She continues to remain off CellCept. Last seen in October 2016. At that time I discharged her from the  office with doxycycline and prednisone taper. After that early November 2016 she was given another course of Doxy and prednisone burst. This did not help. Sputum culture at this time showed normal flora. She called again with the middle of November was given Levaquin and prednisone burst. She feels Levaquin helped her immensely. At this point in time she is reporting progressive dyspnea and also worsening arthritis. She wants to go back on immunomodulators again. She has discussed Imuran with [name]. She is due to see [name] tomorrow. She also feels that she is unable to come off prednisone. She feels at a minimum she needs 10 mg of prednisone per day. She will discuss this with Dr. D her rheumatologist tomorrow. She's also interested in a second opinion at [facility] interstitial lung disease clinic which I did discuss with her most recently. She continues to use oxygen.     OV 07/17/2015  Chief Complaint  Patient presents with   Follow-up    Pt states that she has improved. Pt c/o continued SOB with exertion, some ocugh and wheeze, and some intermittent chest tightness. Pt states that she does sleep a lot. Pt states that her heart rate remains high and that cardiology said it was due to her lungs. Pt is currently on Lasix, 21m, but still seems to retain a lot of fluid and is not seeming to go to the bathroom as frequently.    Follow-up interstitial lung disease secondary to rheumatoid arthritis in the setting of morbid obesity, poor quality of life and heavy chronic pain opioid use and depressive symptoms  Last seen November 2016. Since then she has seen [name] rheumatology clinic. She was given instructions on Imuran which she has red but she has not decided whether she should take it or not. She subsequently has followed up with [facility] interstitial lung disease clinic. CT scan of the chest reviewed result shows possible UIP pattern 06/12/2015. She had autoimmune profile which was strongly positive for  both rheumatoid factor and cyclic citrulline peptide both markers for rheumatoid arthritis. She did see Dr. CMargreta JourneyB on 07/02/2015. It appears that the note is not complete but according to the patient was supposed to be a multidisciplinary conference and they will get back to her. It is possible a second immunomodulators agent and is being contemplated but patient is leery of this given prior issues with opportunistic infection not otherwise specified and admissions to the hospital. In the interim she is also seen cardiology Janr 2017 Dr. BGwenlyn Foundwho did not feel dyspnea was cardiac related. I reviewed his note  OV 10/17/2015  Chief Complaint  Patient presents with   Follow-up    Pt reports she feels improved. Pt c/o continued SOB with exertion, some wheeze and occasional cough. Pt denies CP/tightness.     Follow-up interstitial lung disease secondary to rheumatoid arthritis in the setting of morbid obesity, poor quality of life and heavy chronic pain opioid  use and depressive symptoms   Since seeing me last in February 2017. She followed up at [facility] again pulmonary clinic in March 2017. I reviewed those notes. She saw Dr. Margreta Journey B on 07/31/2015. Due to the presence of bronchiectasis she's been started on  azithromycin 3 times a week. She says this has helped. In addition due to physical deconditioning and obesity she is getting home physical therapy is also help. She is interested in pulmonary rehabilitation although given obesity and back pain can be challenging. She nevertheless wants to try. She is on daily prednisone 10 mg a day. She met with [name] in the rheumatology clinic and has been referred now to [facility] rheumatology clinic for second opinion for a second immunomodulators. She continues to be morbidly obese and has been losing weight. She is somewhat interested in visiting with a [facility] for weight loss.   OV 02/18/2016  Chief Complaint  Patient presents with    Follow-up    Pt states she did not go to pulm rehab d/t BIL knee pain. Pt denies change in SOB, significant cough, CP/tightness and f/c/s. Pt states she is exercising at home. pt states she is doing well overall.    Problem List: 1.  Extensive bronchiectasis - cylindrical vs. Traction - has been present and not significantly changed from 2014 to 2017                       - has history of hospitalization for what sounds like pneumonia vs. Bronchiectasis flare as far back as 1999; underwent bronchoscopy which made her feel "like a new woman" in 1999 2.  Pulmonary fibrosis - basilar predominant reticular opacities that have not change from 2014 to 2017 3.  Rheumatoid arthritis: RF 959 ; CCP >300                       - has been treated with methotrexate, biologics, etc in the past - none in the past several years                       - currently on 7.5/4m alter day 4.  Morbid obesity with deconditioning - has had several hospitalizations in recent months; very deconditioned at present time  - [facility]     Followup for above  - [facility]. [facility] Review of the notes suggest that they have her on a slightly lower dose of prednisone with intent to taper to 5 mg per day. He also have her on hydroxychloroquine. No further disease modifying agents are being considered for rheumatoid arthritis. In terms of her interstitial lung disease and bronchiectasis she's on conservative therapy along with Activella device. This is helping and she feels better. Today she came in walking with her walker. This the happiest I have f seen her. She's not lost any weight but [facility]. She tried to go to pulmonary rehabilitation but could not because of knee pain. She is up-to-date with pneumonia vaccine. She's had a flu shot this  season.    OV 03/18/2016  Chief Complaint  Patient presents with   Acute Visit    Pt states breathing is not that bad today. Pt states she has cogestion build up, no tightness, SOB w/o excertion. Pt states  breathing has been better since last visit except the mucus. No cough feels the need to cough but can not.      follow-up bronchiectasis and immunosppessive thrapy. Visit 3 month olow-up. Body mssindex is stll very high but she says she has lost weight. Overal well. Last few days increasd symptoms of mucus, cough and subjective unwellness. No fever or colored sputum. But she feells she needs antibiotic and prednisone  OV 06/23/2016  Chief Complaint  Patient presents with   Follow-up    Pt states her SOB has been doing well but she has been more fatigued. Pt has an occ prod cough with light yellow mucus and chest tightness when SOB - resolves with rest.    Follow-up bronchiectasis with interstitial lung disease on immunosuppressive therapy due to rheumatoid arthritis. Last visit 01/17/2016. Since then she's lost 5 pounds of weight according to history. . She is here with her daughter. No acute bronchitis symptoms. Overall stable. The main issues that she's having increased joint pain and she is only on plaquenil. No fever or colored sputum.   OV 02/16/2017  Chief Complaint  Patient presents with   Follow-up    Pt states breathing has been okay. Stated she had a URI x1 month ago which she developed while in rehab after hip fx. Occ. SOB and occ. chest tightness. Denies any cough at this time.    Follow-up bronchiectasis with interstitial lung disease on immunosuppressive therapy due to rheumatoid arthritis. Last visit was 6 months ago approximately. Since then she has continued to lose weight through dietary control. She feels better. However in the summer 2018 she fell down due to orthostasis and fractured her left hip. This wasn't complicated by MRSA infection according to her history.  She is now undergoing physical therapy. Overall she's feeling well. She is very proud of her weight loss. Dyspnea is improved. She missed [facility] appointment for ILD clinic but she will make this again. She uses oxygen with heavy exertion at physical therapy and at night.  OV 02/11/2018  Subjective:  Patient ID: [name], female , DOB: [birth year] , age 40 y.o. , MRN: [id number] , ADDRESS: [place] [id number]   02/11/2018 -   Chief Complaint  Patient presents with   Follow-up    Pt states she has been doing okay since last visit. States she has had some pain in the mid region of her chest x3 weeks and has also been having problems with fatigue. States she has had some occ coughing. Her main complaints are the pain in her chest and the exhaustion.     HPI [name] 81 y.o. -follow-up ILD with bronchiectasis in the setting of rheumatoid arthritis and obesity and physical deconditioning  It is almost a year since I last saw her.  Since then her daughter died in [place] with advanced sarcoma.  She is getting grief counseling from hospice.  She also has palliative care support at home.  She also in the last year has fallen and hurt her back.  She is on attending physical therapy which is helping her.  She says she is lost 30 pounds in the last 1 year.  Because of all this functional status is somewhat better but still she is extremely dyspneic.  In fact when she walked 185 feet x 3 laps on room air using a walker: She is only able to complete a lap and a half and stop because of extreme shortness of  breath.  Her resting pulse ox is 98%.  Final pulse ox was 98%.  And her heart rate went from 107/min to 140/min.  She has not yet had a flu shot.  Of note she is on prednisone 15 mg/day which has helped her arthritis.  She is asking if it is okay to go back down to 10 mg/day from a lung standpoint.  She is also on a different arthritis immunomodulator which is helped her  rheumatoid arthritis pain.  She gets this treatment from [facility].  Pulmonary ILD medications also being managed at [facility].      OV 02/10/2019  Subjective:  Patient ID: [name], female , DOB: 1940-07-04 , age 38 y.o. , MRN: [id number] , ADDRESS: [address] [place] [address] [name]. -follow-up ILD with bronchiectasis in the setting of rheumatoid arthritis and obesity and physical deconditioning  Problem List - copy and paste from [place] B: 1.  Extensive bronchiectasis - cylindrical vs. Traction - has been present and not significantly changed from 2014 to 2017                       - has history of hospitalization for what sounds like pneumonia vs. Bronchiectasis flare as far back as 1999; underwent bronchoscopy which made her feel "like a new woman" in 1999 2.  Pulmonary fibrosis - basilar predominant reticular opacities that have not change from 2014 to 2017 3.  Rheumatoid arthritis: RF 959 ; CCP >300                       - has been treated with methotrexate, biologics, etc in the past - none in the past several years                       - currently on 10 mg prednisone daily 4.  Morbid obesity with deconditioning - has had several hospitalizations in recent months; very deconditioned at present time   02/10/2019 -   Chief Complaint  Patient presents with   Follow-up    Pt here for ILD follow up. Pt states her breathing is unchanged since last OV in 01/2018. Pt denies new symptoms. Pt denies CP/tightness and f/s/c.      HPI [name] 81 y.o. -returns for follow-up.  I personally not seen her in 1 year.  In the interim she is kept up with follow-up at [facility].  In January 2020 she suffered from left lower extremity DVT after having shortness of breath.  She was admitted at [facility] and started on Eliquis.  She still continues with Eliquis.  She tells me that Morrie Sheldon was  considered as the cause although she does have obesity and sedentary lifestyle and venous incompetency.  It appears this was her first DVT.  She has had a CT angiogram chest in 2005 that was negative for PE and a Doppler ultrasound in 2016 and 2018 that was negative for DVT.  She says a cardiologist here locally is managing her Eliquis and plans to stop it after several months.  In terms of her rheumatoid arthritis she is now under the care of [place] [facility].  They just having her on prednisone.  In July 2020 she also saw Dr. Jacinto Reap pulmonary at [facility].  Apparently a walk test could not be performed.  She says she has lost weight.  Her main complaint is exertional dyspnea.  She wants to do portable oxygen.  She is only on nighttime oxygen.  In the past she always gets dyspneic way before she even desaturates a little bit this is on account of her obesity and deconditioning.  Her son is here with with her today.  This the first time I am meeting him.  He tells me that her breathing at night is rough although not as bad as his dad sleep apnea.  Patient is not on CPAP.  He is also curious about qualifying oxygen with walking.  Walking desaturation test for an 85 feet x 3 laps: She basically walked only half a lap.  Resting pulse ox was 97% with a heart rate of 102.  Final pulse ox was 96% with a heart rate of 122 and she stopped because of dyspnea.  She had a most recent CT chest there is resulted below and it is stable.   IMPRESSION: CT Chest high resolution 1. Mid and lower lung zone predominant bronchiectasis, peribronchovascular ground-glass and scattered subpleural reticular densities, grossly stable from 11/29/2014. Findings are nonspecific and can be seen with nonspecific interstitial pneumonitis. Usual interstitial pneumonitis is considered less likely but respiratory motion and expiratory phase imaging make assessment difficult. 2. Coronary artery calcification.     Electronically  Signed   By: Lorin Picket M.D.   On: 11/24/2018 13:04  ROS - per HPI  Results for DEJON, JUNGMAN" (MRN [id number]) as of 02/10/2019 14:47  Ref. Range 04/17/2015 18:02 04/18/2015 13:30  D-Dimer, America Brown Latest Ref Range: 0.00 - 0.50 ug/mL-FEU 2.57 (H) 2.54 (H)   OV 06/21/2019  Subjective:  Patient ID: [name], female , DOB: 14-Oct-1940 , age 42 y.o. , MRN: [id number] , ADDRESS: [place] [id number]   06/21/2019 -   Chief Complaint  Patient presents with   Follow-up    Pt states she was recently admitted to hospital at [facility] for 10 days. Since then, pt has been  having some low O2 sats and issues with SOB. Pt states she is better now than she was.   [name]. -follow-up ILD with bronchiectasis in the setting of rheumatoid arthritis and obesity and physical deconditioning  Problem List - copy and paste from [place] B: 1.  Extensive bronchiectasis - cylindrical vs. Traction - has been present and not significantly changed from 2014 to 2017                       - has history of hospitalization for what sounds like pneumonia vs. Bronchiectasis flare as far back as 1999; underwent bronchoscopy which made her feel "like a new woman" in 1999 2.  Pulmonary fibrosis - basilar predominant reticular opacities that have not change from 2014 to 2017 and in dec 2020 CT chest 3.  Rheumatoid arthritis: RF 959 ; CCP >300                       - has been treated with methotrexate, biologics, etc in the past - none in the past several years                       - currently on 10 mg prednisone daily 4.  Morbid obesity with deconditioning - has had several hospitalizations in recent months; very deconditioned at present time   HPI [name] 81  y.o. -admitted approximately 2 months ago early December 2020 with Klebsiella pansensitive urinary tract infection and bilateral lower extremity cellulitis.  Discharge on 2 L nasal  cannula.  Recommendations for home physical therapy were made.  Bilateral Doppler extremities were negative in the hospital.  Her CODE STATUS is full.  She returns for routine follow-up.  Her daughter is with her.  She continues to lose weight.  She has lost around 40 to 50 pounds.  She says she is on the medication for rheumatoid arthritis I do not know which one.  She says her cellulitis was quite bad and she is suffered from it.  But nevertheless with the weight loss her hypoxemia and overall joint pain is better.  She still has significant pedal edema and erythema in her lower extremities.  She is asking for some advice with that.  Otherwise no issues.  Using 2 L nasal cannula at night.  Walks around with a walker.  Symptom score is listed b       11/22/2020- Interim hx  with NP Patient presents today for 2 week follow-up PNA. She had a CXR that showed slight increase in opacity right lung base, questionable infiltrate. Treated with Augmentin 1 tab BID x 10 days. She is feeling a great deal better. She no longer has right sided pain. She still has some wheezing and cough with yellow mucus. She is taking muciex 1,277m twice a day. She is using her flutter valve 3-4 times a day. She has been using nebulizer twice a day. She takes 124mprednisone daily. She is on Toprol XL 7588mshe has not taken this yet today. She takes Azithromycin MWF pulmonary with [facility]. She can resume this. Needs repeat CXR today.  TEST/EVENTS : High-resolution CT chest November 24, 2018 mid to lower lung bronchiectasis, groundglass reticular densities stable since 2016  [facility]\  Interval History:  Has been doing ok until about a month ago.  Had pneumonia on the right side a month ago. Even now, sometimes she feels that she has pain in her R chest - upper front chest. Even sometimes today, it feels kind of bad.   She was doing a lot of mucus production. They she started hurting badly on the right side.  Thought she was sleeping too much. Did not pay it any mind. Went to see the doctor and she told them about the pain. They did an xray and diagnosed with pneumonia. She did not have any fever or chills - but she says that she very seldom in her life has had temp >99. She took antibiotics. Follow up xray revealed clearing. She was put on prednisone 40 mg with taper. She still gets kind of hoarse and has mucus in her upper throat. She is scheduled now for surgery for cataracts on August 9th.   She has started getting joint pain back in her knees again.   She was out of azithromycin. She [facility] [facility]. Needs a 90 day supply.   Using Acapella. She has been using her albuterol nebulizer at home. She still has a lot of wheezing. This wheezing sometimes clears with coughing mucus, sometimes does not. Unsure if this gets better with the nebulizer. Happens more when she is laying down.   At the local pulmonologist, she needed oxygen and now has oxygen at home. Using [place] is using 2L with (sometimes 3L). This is with  laying down. When walking and moving, she keeps it on 2L.   Mucus production is getting better. She is using the acapella three or four times per day. She is using the nebulizer twice per day.   Has R chest discomfort which feels a bit worse today than prior. She says that it feels like a strain and it affects her voice. When she talks, it is like it is pulling or straining something.   Has help now every day at home. Son has been on her case about that.   Is on 10 mg prednisone - and has been on this dose for about a week.   Outside echo 06/04/2018: Study Conclusions   - Left ventricle: The cavity size was normal. There was mild focal    basal hypertrophy of the septum. Systolic function was normal.    The estimated ejection fraction was in the range of 60% to 65%.    Wall motion was normal; there were no regional wall motion     abnormalities.  - Right ventricle: The cavity size was mildly dilated. Wall    thickness was normal.  - Pulmonary arteries: Systolic pressure was mildly increased.   Echo pharm stress test with contrast 12/15/2018:  NORMAL STRESS TEST. NORMAL RESTING STUDY WITH NO WALL MOTION ABNORMALITIES AT REST AND PEAK STRESS. NO DOPPLER PERFORMED FOR VALVULAR REGURGITATION NO DOPPLER PERFORMED FOR VALVULAR STENOSIS NORMAL RESTING BP - APPROPRIATE RESPONSE  Cath done on 10/31/14:  Mild CAD without significant obstructive disease. Ectasia in all three  major proximal vessels.  Mild pulmonary hypertension. Aortic saturation 87%. Pulmonary artery  saturation 60%. Cardiac output 7.6 L. Cardiac index 3.3.  Continue medical therapy for lung disease. Continue preventative therapy  for her heart disease.Addendum by [name], MD on 10/31/2014 12:01 PM  Mild CAD without significant obstructive disease. Ectasia in all three  major proximal vessels.  Mild pulmonary hypertension. Aortic saturation 87%. Pulmonary artery  saturation 60%. Cardiac output 7.6 L. Cardiac index 3.3.  Current Functional Status:  Oxygen: uses 2-3L of oxygen at home PRN and with sleep.  Level of exertion that leads to dyspnea: Walking any distance makes her dyspneic.   Rehab: using the walker most of the time. When she goes out, she uses a wheelchair. Home PT was helpful. She had a problem getting in and out of vehicle and bed.  Laboratory Data/Test Results: Pulmonary Function Test (PFT) Latest Ref Rng & Units 07/02/2015 12/14/2017  FVC PRE L 1.78 1.58  FVC % PRE PRED % 63 57  FEV1 PRE L 1.49 1.33  FEV1 % PRE PRED % 70 65  FEV1/FVC PRE % 83.74 84.24  FEF25-75% PRE L/s 2.83 2.39  FEF25-75% % PRE PRED % 154 141  DLCO PRE ml/(min*mmHg) 11.28 11.41  DLCO % PRE PRED % 65 67   No PFTs today.  PFTs 12/14/17: FVC 1.58, 57 % predicted. FEV1 1.33, 65% predicted. FEV1/FVC 84.24. DLCO 11.41, 67% predicted. Compared to  06/2015, FVC is down 11.2%. DLCO is essentially stable.   Echocardiogram: 11/21/2015 - EF >55% with normal wall motion. Mildly enlarged RV with normal free wall. Mildly enlarged RA. Normal respiratory collapse of IVC. Peak RV pressure 41 mmHg.   EOTT today: Room air Spo2 96% at rest. HR at start of exercise was 122 / max HR was 150 during exercise = 106% predicted. Lowest SpO2 95% on room air. Walked 258 meters = 846 feet = 81% predicted. Total walk time = 5 minutes. Borg  dyspnea scale = 7. Test ended early because of dyspnea, fatigue, and chest tightness.   Labs:  None today  Impression: Patient is a 81 y.o. with PMH significant for rheumatoid arthritis, bronchiectasis (with flare vs. Pneumonia in late 2016), pulmonary fibrosis (not progressed per CT from 2014 to 06/2015), morbid obesity, and deconditioning who comes to pulmonary clinic for follow up of lung disease and dyspnea. She has been maintained on an aggressive bronchiectasis regimen (Acapella, nebulizer, TIW azithromycin) and she has done well. Unfortunately, she ran out of azithromycin, and in the setting of this, she developed increased cough and sputum and was treated for pneumonia.   Today, she feels like she is recovered from the PNA, but she is not all the way back to her baseline. She has some congestion in her throat and she is concerned about pain in her chest (which is reproducible with palpation over the sternum.  She has limited mobility at home. She uses furniture / counters to get around if she is not using her walker. She does some physical therapy.   Plan - from patient instructions:  I have renewed your azithromycin - you have a year supply that will be sent to [facility] in [place], [facility] [place]). You should take this every Monday, Wednesday, Friday.   I am also going to write a prescription for a 1 week supply of DAILY azithromycin. So take it daily for 7 days, then switch to every Monday, Wednesday, Friday. The  prescription is written so that this can all be under a single prescription.   Continue to use your Acapella at least 3-4 times per day.   We are going to get another chest x ray today.   Regarding your nebulizer, we will renew albuterol order from [facility].   Plan to take your nebulizer three times per day (along with Acapella use). Once your congestion is improved, you may back off to twice per day.   Try to use your voltaren gel on your chest (nearly your sternum where it is sore) to see if this helps with your pain.   Return in about 6 months (around 06/12/2021).     OV 05/23/2021  Subjective:  Patient ID: [name], female , DOB: 01/22/1941 , age 35 y.o. , MRN: [id number] , ADDRESS: [place] [id number] PCP [name], MD Patient Care Team: [name], MD as PCP - General (Family Medicine) Lorretta Harp, MD as PCP - Cardiology (Cardiology) Duffy, Creola Corn, LCSW as Social Worker (Licensed Clinical Social Worker) Kidney, Nelida Meuse, Abel Presto, MD as Consulting Physician (Rheumatology) Efraim Kaufmann, NP (Rheumatology) Theodosia Blender, MD as Referring Physician (Pulmonary Disease)  This Provider for this visit: Treatment Team:  Attending Provider: Brand Males, MD    05/23/2021 -   Chief Complaint  Patient presents with   Follow-up    Pt states since last OV, she has been in and out of the hospital. States she also ended up with Covid and has had problems with a lot of coughing and phlegm build-up. Pt's O2 sats had been dropping and had to bump O2 up to 3L    [name]. -follow-up ILD with bronchiectasis in the setting of rheumatoid arthritis and obesity and physical deconditioning  Problem List - copy and paste from [place] [facility]: 1.  Extensive bronchiectasis - cylindrical vs. Traction - has been present and not significantly changed from 2014 to 2017                       -  has history of  hospitalization for what sounds like pneumonia vs. Bronchiectasis flare as far back as 1999; underwent bronchoscopy which made her feel "like a new woman" in 1999 2.  Pulmonary fibrosis - basilar predominant reticular opacities that have not change from 2014 to 2017 and in dec 2020 CT chest 3.  Rheumatoid arthritis: RF 959 ; CCP >300                       - has been treated with methotrexate, biologics, etc in the past - none in the past several years                       - currently on 10 mg prednisone daily 4.  Morbid obesity with deconditioning - has had several hospitalizations in recent months; very deconditioned at present time  - intentl wt loss +  HPI [name] 80 y.o. -returns for follow-up.  I personally not seen her in 2 years almost.  In between she is followed up with nurse practitioner and also [facility].  She is here with her daughter to [place] who I am meeting for the first time.  They tell me that she has had 2 admissions for dehydration in 2022.  After that she went to [facility] rehab where in November 2022 she ended up with COVID and was given antiviral.  They were unhappy with the admission to [facility] rehab.  They do not want to go there again.  Apparently after that she is now needing 3 L of oxygen while previously she is only needing 2 L of nasal cannula oxygen.  Last CT scan of the chest echocardiogram 2020 including review of the medical records at [place] pulmonary function test with [place] was 2015 while at [facility] it was in 2019.  She continues to have functional disability.  She uses a walker to get around and change her clothes.  She continues to be immunosuppressed with prednisone, Plaquenil and also leflunomide for rheumatoid arthritis.  She has chronic pain and is on opioids \CT Chest data  No results found.   OV 07/12/2021  Subjective:  Patient ID: [name], female , DOB: 1940/10/27 , age 64 y.o. , MRN:  [id number] , ADDRESS: [place] [address] PCP [name], MD Patient Care Team: [name], MD as PCP - General (Family Medicine) Lorretta Harp, MD as PCP - Cardiology (Cardiology) Duffy, Creola Corn, LCSW as Social Worker (Licensed Clinical Social Worker) Kidney, Nelida Meuse, Abel Presto, MD as Consulting Physician (Rheumatology) Efraim Kaufmann, NP (Rheumatology) Theodosia Blender, MD as Referring Physician (Pulmonary Disease)  This Provider for this visit: Treatment Team:  Attending Provider: Brand Males, MD    07/12/2021 -   Chief Complaint  Patient presents with   Follow-up    PFT performed today.   Pt states her breathing is about the same since last visit. States she is no longer coughing up as much mucus compared to last visit.    Problem List - copy and paste from [place] [facility]: 1.  Extensive bronchiectasis - cylindrical vs. Traction - has been present and not significantly changed from 2014 to 2017                       - has history of hospitalization for what sounds like pneumonia vs. Bronchiectasis flare as far back  as 1999; underwent bronchoscopy which made her feel "like a new woman" in 1999 2.  Pulmonary fibrosis - basilar predominant reticular opacities that have not change from 2014 to 2017 and in dec 2020 CT chest 3.  Rheumatoid arthritis: RF 959 ; CCP >300                       - has been treated with methotrexate, biologics, etc in the past - none in the past several years                       - currently on 10 mg prednisone daily 4.  Morbid obesity with deconditioning - has had several hospitalizations in recent months; very deconditioned at present time  - intentl wt loss +  5. COvid - Nov 2022 associated with hospitalizations for dehydration   HPI [name] 80 y.o. -returns for follow-up.  She presents with her daughter [name].  She is here to review results.  She does say that after her  COVID in 07 April 2021 her dyspnea is worse and then also oxygen uses up to 3 L.  Compared to the last visit she is stable but definitely compared to a few years ago she is more short of breath and requiring more oxygen.  She also reports that since November 2022 her right fifth finger has more lateral deviation.  In her fourth finger seems to be in a fixed abnormality.  She did try a short course prednisone with her primary care physician and it does not help.  She has rheumatology appointment pending with Dr. Clovis Riley at [facility] next month.  Her test results show that her echo is essentially normal.  No comment about pulmonary hypertension.  Her pulmonary function test shows FVC declined compared to 2019 at [facility] and also 2015 with [place].  This correlates with increased shortness of breath over the last few years increased subjective oxygen need.  She did have a high-resolution CT scan of the chest that our radiologist feels there is progression and she has UIP pattern with emerging honeycombing.  I personally visualized various CT scans of the chest.  I am not so sure about progression but I did tell her that I would put this up for multidisciplinary case conference.  We discussed care for progressive interstitial lung disease in the setting of rheumatoid arthritis.  Did discuss about registry protocol and I gave her the consent form.  She is interested in this.  Also discussed about nintedanib antifibrotic.  Did discuss about diarrhea as a side effect and GI issues.  She says she is constipated she and her daughter feel with polypharmacy that she should easily be able to handle this 1 other medication.  They are interested in this.  They are willing to wait till multidisciplinary case conference is done.  We also discussed the fact with WHO group 3 pulmonary hypertension inhaled treprostinil as an option.  She is never had a right heart cath.  I have written to Dr. Quay Burow her  cardiologist to consider right heart catheterization.  We will regroup in a couple of months.      SYMPTOM SCALE - ILD 06/21/2019 Birth weight not on file Last Weight  Most recent update: 06/21/2019 11:53 AM    Weight  113.3 kg (249 lb 12.8 oz)             05/23/2021 224# (used to 300# some  yearsaago) 07/12/2021 224#  O2 use 2L nithg 3L 3L  Shortness of Breath 0 -> 5 scale with 5 being worst (score 6 If unable to do)    At rest _0 Simple tasks - showers, clothes change, eating, shaving _1 Household (dishes, doing bed, laundry) 4  5  Shopping 6  5  Walking level at own pace _2 Walking up Stairs 6  x  Total (30-36) Dyspnea Score _3 How bad is your cough? _4 How bad is your fatigue 0 0 4  How bad is nausea 0 0 0  How bad is vomiting?  0 0 0  How bad is diarrhea? 0 0 0  How bad is anxiety? _5 How bad is depression _6 PFT  PFT Results Latest Ref Rng & Units 07/12/2021 12/14/2017 - [facility] 01/12/2014 09/12/2013 04/28/2013  FVC-Pre L 1.29 1.58 1.59 1.78 1.73  FVC-Predicted Pre % 70  76 77 74  FVC-Post L -  1.46 1.54 1.19  FVC-Predicted Post % -  70 66 51  Pre FEV1/FVC % % 92  91 91 84  Post FEV1/FCV % % -  93 89 97  FEV1-Pre L 1.18 1.33 1.45 1.63 1.46  FEV1-Predicted Pre % 83  90 90 81  FEV1-Post L -  1.36 1.36 1.15  DLCO uncorrected ml/min/mmHg -0.03 11.41 16.69 12.50 -  DLCO UNC% % 0  77 51 -  DLCO corrected ml/min/mmHg -0.03  - - -  DLCO COR %Predicted % 0  - - -  DLVA Predicted % 29  95 91 -  TLC L -  3.55 3.57 2.94  TLC % Predicted % -  74 70 58  RV % Predicted % -  62 66 62    COMPARISON:  Chest CT 01/16/2021.   FINDINGS: Cardiovascular: Heart size is normal. There is no significant pericardial fluid, thickening or pericardial calcification. There is aortic atherosclerosis, as well as atherosclerosis of the great vessels of the mediastinum and the coronary arteries, including calcified atherosclerotic plaque in the left anterior  descending coronary artery.   Mediastinum/Nodes: No pathologically enlarged mediastinal or hilar lymph nodes. Esophagus is unremarkable in appearance. No axillary lymphadenopathy.   Lungs/Pleura: High-resolution images demonstrate some patchy areas of ground-glass attenuation, septal thickening, subpleural reticulation, mild cylindrical traction bronchiectasis, peripheral bronchiolectasis and a few areas of apparent developing honeycombing. These findings have a definitive craniocaudal gradient and appear minimally progressive compared to the recent prior examination. Inspiratory and expiratory imaging demonstrates some mild air trapping indicative of mild small airways disease. No acute consolidative airspace disease. No pleural effusions. No definite suspicious appearing pulmonary nodules or masses are noted.   Upper Abdomen: Unremarkable.   Musculoskeletal: There are no aggressive appearing lytic or blastic lesions noted in the visualized portions of the skeleton.   IMPRESSION: 1. The appearance of the lungs is considered diagnostic of usual interstitial pneumonia (UIP) per current ATS guidelines, demonstrating mild progression compared to prior examinations. 2. Aortic atherosclerosis, in addition to left anterior descending coronary artery disease. Please note that although the presence of coronary artery calcium documents the presence of coronary artery disease, the severity of this disease and any potential stenosis cannot be assessed on this non-gated CT examination. Assessment for potential risk factor modification, dietary therapy or pharmacologic therapy may be warranted, if clinically indicated.   Aortic Atherosclerosis (ICD10-I70.0).  Electronically Signed   By: Vinnie Langton M.D.   On: 06/21/2021 07:48     has a past medical history of Acute on chronic diastolic (congestive) heart failure ([place]), Anemia, Anxiety, Arthritis, Asthma, Carotid artery  occlusion, Chronic fatigue, Chronic kidney disease, Closed left hip fracture (HCC), Clotting disorder ([place]), Contusion of left knee, COPD (chronic obstructive pulmonary disease) ([place]), Depression, reactive, Diabetes mellitus, Diastolic dysfunction, Difficulty in walking, Family history of heart disease, Generalized muscle weakness, Gout, High cholesterol, History of falling, Hypertension, Hypothyroidism, Interstitial lung disease ([place]), 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   reports that she has never smoked. She has never used smokeless tobacco.  Past Surgical History:  Procedure Laterality Date   ABDOMINAL HYSTERECTOMY     BRAIN SURGERY     Gamma knife 10/13. Needs repeat spring  '14   CARDIAC CATHETERIZATION N/A 10/31/2014   Procedure: Right/Left Heart Cath and Coronary Angiography;  Surgeon: [name], MD;  Location: [facility];  Service: Cardiovascular;  Laterality: N/A;   ESOPHAGOGASTRODUODENOSCOPY (EGD) WITH PROPOFOL N/A 09/14/2014   Procedure: ESOPHAGOGASTRODUODENOSCOPY (EGD) WITH PROPOFOL;  Surgeon: [name], MD;  Location: [facility];  Service: Endoscopy;  Laterality: N/A;   INCISION AND DRAINAGE HIP Left 01/16/2017   Procedure: IRRIGATION AND DEBRIDEMENT LEFT HIP;  Surgeon: [name], MD;  Location: [facility];  Service: Orthopedics;  Laterality: Left;   INTRAMEDULLARY (IM) NAIL INTERTROCHANTERIC Left 11/29/2016   Procedure: INTRAMEDULLARY (IM) NAIL INTERTROCHANTRIC;  Surgeon: [name], MD;  Location: [facility];  Service: Orthopedics;  Laterality: Left;   OVARY SURGERY     SHOULDER SURGERY Left    TONSILLECTOMY  age 57   VIDEO BRONCHOSCOPY Bilateral 05/31/2013    Procedure: VIDEO BRONCHOSCOPY WITHOUT FLUORO;  Surgeon: Brand Males, MD;  Location: [facility];  Service: Cardiopulmonary;  Laterality: Bilateral;   video bronscoscopy  2000   lung    Allergies  Allergen Reactions   Codeine Swelling and Other (See Comments)    Facial swelling Chest pain and swelling in legs  Facial swelling   Infliximab Anaphylaxis    "sent me into shock" "sent me into shock"   Lisinopril Swelling    Face and neck swelling Face and neck swelling    Immunization History  Administered Date(s) Administered   Fluad Quad(high Dose 65+) 02/02/2019, 03/11/2021   Influenza Split 01/29/2012, 03/30/2013, 02/10/2014   Influenza, High Dose Seasonal PF 02/16/2017, 02/11/2018, 01/31/2019, 02/27/2020   Influenza,inj,Quad PF,6+ Mos 02/09/2015, 12/18/2015   Influenza-Unspecified 02/09/2015, 12/18/2015   PFIZER Comirnaty(Gray Top)Covid-19 Tri-Sucrose Vaccine 07/24/2019, 08/14/2019, 02/27/2020   PFIZER(Purple Top)SARS-COV-2 Vaccination 07/17/2019, 08/14/2019   PPD Test 04/23/2015   Pneumococcal Conjugate-13 08/21/2014   Pneumococcal Polysaccharide-23 02/17/2012   Tdap 12/27/2015    Family History  Problem Relation Age of Onset   Diabetes Mother    Heart attack Mother    Hypertension Father    Lung cancer Father    Diabetes Sister    Breast cancer Sister    Breast cancer Sister    Diabetes Brother    Hypertension Brother    Heart disease Brother    Heart attack Brother    Kidney cancer Brother    Gout Brother    Kidney failure Brother        x 5  Esophageal cancer Brother    Stomach cancer Brother    Prostate cancer Brother    Uterine cancer Daughter    Hypertension Daughter    Hypertension Daughter    Rheum arthritis Maternal Uncle    Hypertension Son    Heart murmur Son      Current Outpatient Medications:    acetaminophen (TYLENOL) 500 MG tablet, Take 1,000 mg by mouth every 6 (six) hours as needed for moderate pain. , Disp: , Rfl:     albuterol (PROVENTIL) (2.5 MG/3ML) 0.083% nebulizer solution, Take 3 mLs (2.5 mg total) by nebulization every 6 (six) hours as needed for wheezing., Disp: 75 mL, Rfl: 0   albuterol (VENTOLIN HFA) 108 (90 Base) MCG/ACT inhaler, Inhale 2 puffs into the lungs every 6 (six) hours as needed for wheezing or shortness of breath., Disp: 6.7 g, Rfl: 0   allopurinol (ZYLOPRIM) 300 MG tablet, Take 1 tablet (300 mg total) by mouth daily., Disp: 30 tablet, Rfl: 0   Artificial Tear Ointment (DRY EYES OP), Apply 1 drop to eye daily as needed (dry eyes)., Disp: , Rfl:    aspirin 325 MG tablet, Take 1 tablet (325 mg total) by mouth daily., Disp: 30 tablet, Rfl: 0   atorvastatin (LIPITOR) 20 MG tablet, Take 0.5 tablets (10 mg total) by mouth every evening., Disp: 15 tablet, Rfl: 0   Calcium Carb-Cholecalciferol (CALCIUM + D3 PO), Take 2 tablets by mouth daily with lunch. , Disp: , Rfl:    Cholecalciferol (VITAMIN D-3) 25 MCG (1000 UT) CAPS, Take 2 capsules (2,000 Units total) by mouth daily. GIVE ONE TAB PO Q DAY, Disp: 60 capsule, Rfl: 0   colchicine 0.6 MG tablet, Take 1 tablet (0.6 mg total) by mouth daily as needed (gout flares)., Disp: 30 tablet, Rfl: 0   cyclobenzaprine (FLEXERIL) 5 MG tablet, Take 1 tablet (5 mg total) by mouth at bedtime as needed for muscle spasms., Disp: 30 tablet, Rfl: 0   diclofenac Sodium (VOLTAREN) 1 % GEL, Apply 2 g topically 3 (three) times daily. Apply to both hands and knees, Disp: 100 g, Rfl: 1   Dulaglutide (TRULICITY) 0.17 BL/3.9QZ SOPN, Inject 0.75 mg into the skin once a week., Disp: 6 mL, Rfl: 2   DULoxetine (CYMBALTA) 60 MG capsule, Take 1 capsule (60 mg total) by mouth daily., Disp: 30 capsule, Rfl: 0   fluticasone (FLONASE) 50 MCG/ACT nasal spray, Place 2 sprays into both nostrils daily., Disp: 16 g, Rfl: 3   furosemide (LASIX) 80 MG tablet, Take 1 tablet (80 mg total) by mouth daily., Disp: 30 tablet, Rfl: 0   gabapentin (NEURONTIN) 600 MG tablet, Take 2 tablets (1,200 mg  total) by mouth 3 (three) times daily., Disp: 180 tablet, Rfl: 0   guaiFENesin (MUCINEX) 600 MG 12 hr tablet, Take 2 tablets (1,200 mg total) by mouth 2 (two) times daily., Disp: 120 tablet, Rfl: 0   hydroxychloroquine (PLAQUENIL) 200 MG tablet, Take 1 tablet (200 mg total) by mouth daily., Disp: 30 tablet, Rfl: 0   levothyroxine (SYNTHROID) 50 MCG tablet, Take 1 tablet (50 mcg total) by mouth daily., Disp: 30 tablet, Rfl: 0   linaclotide (LINZESS) 72 MCG capsule, Take 1 capsule (72 mcg total) by mouth daily before breakfast., Disp: 30 capsule, Rfl: 0   metolazone (ZAROXOLYN) 2.5 MG tablet, TAKE 1 TABLET BY MOUTH DAILY AS NEEDED FOR 2 DAYS FOR WEIGHT ABOVE 255 POUNDS, Disp: 30 tablet, Rfl: 0   metoprolol succinate (TOPROL-XL) 25 MG 24 hr tablet, Take  5 tablets (125 mg total) by mouth daily., Disp: 150 tablet, Rfl: 0   morphine (MS CONTIN) 15 MG 12 hr tablet, Take 1 tablet (15 mg total) by mouth every 12 (twelve) hours., Disp: 60 tablet, Rfl: 0   morphine (MSIR) 15 MG tablet, One tablet twice a day as needed for break through pain. Do Not Fill Before 06/10/2021, Disp: 60 tablet, Rfl: 0   Multiple Vitamin (MULTIVITAMIN WITH MINERALS) TABS, Take 1 tablet by mouth daily. , Disp: , Rfl:    olmesartan (BENICAR) 40 MG tablet, Take 1 tablet (40 mg total) by mouth daily., Disp: 30 tablet, Rfl: 0   OXYGEN-HELIUM IN, Inhale 3 L into the lungs at bedtime. And on exertion, Disp: , Rfl:    pantoprazole (PROTONIX) 40 MG tablet, take 1 tablet by mouth once daily MINUTES BEFORE 1ST MEAL OF THE DAY, Disp: 30 tablet, Rfl: 0   Potassium Chloride ER 20 MEQ TBCR, Take 20 mEq by mouth daily., Disp: 30 tablet, Rfl: 0   predniSONE (DELTASONE) 10 MG tablet, Take 1 tablet (10 mg total) by mouth daily with breakfast., Disp: 90 tablet, Rfl: 1   predniSONE (DELTASONE) 10 MG tablet, Take 2 tabs (20 mg) daily x 14 days. Then take 1 and a half tabs (15 mg) x 14 days.  Then return to your regular dose of 1 tab (10 mg) daily., Disp: 50  tablet, Rfl: 0   sodium chloride (OCEAN) 0.65 % SOLN nasal spray, Place 1 spray into both nostrils as needed for congestion., Disp: , Rfl:    leflunomide (ARAVA) 20 MG tablet, Take 1 tablet (20 mg total) by mouth daily., Disp: 30 tablet, Rfl: 0      Objective:   Vitals:   07/12/21 1547  BP: 132/80  Pulse: 90  Temp: 98.1 F (36.7 C)  TempSrc: Oral  SpO2: 97%  Weight: 224 lb (101.6 kg)  Height: _0  (1.575 m)    Estimated body mass index is 40.97 kg/m as calculated from the following:   Height as of this encounter: _1  (1.575 m).   Weight as of this encounter: 224 lb (101.6 kg).  _2 @  Filed Weights   07/12/21 1547  Weight: 224 lb (101.6 kg)     Physical Exam    General: No distress.  Morbidly obese lady sitting in the wheelchair.  Oxygen on. Neuro: Alert and Oriented x 3. GCS 15. Speech normal Psych: Pleasant Resp:  Barrel Chest - no.  Wheeze - no, Crackles -bilateral lower lobe crackles present, No overt respiratory distress CVS: Normal heart sounds. Murmurs - no Ext: Stigmata of Connective Tissue Disease - no HEENT: Normal upper airway. PEERL +. No post nasal drip        Assessment:       ICD-10-CM   1. Interstitial lung disease due to connective tissue disease ([place])  J84.89    M35.9     2. Current chronic use of systemic steroids  Z79.52     3. Chronic respiratory failure with hypoxia (HCC)  J96.11          Plan:     Patient Instructions     ICD-10-CM   1. Interstitial lung disease due to connective tissue disease ([place])  J84.89    M35.9     2. History of rheumatoid arthritis  Z87.39     3. Immunosuppression due to drug therapy ([place])  D84.821    Z79.899     4. Current chronic use of systemic steroids  P82.42      Restage your lungs  after covid in Nov 2022 suggests that lungs might be worse based on symptoms,  your own o2 use, PFT and radiology report   Plan  - I have sent message to Dr Gwenlyn Found to consider right heart  catheterization - I will discuss in our case conference you images [either in March or in April 2023] -about progression and also type of findings  -If indeed in the case conference also the appreciate progression then we can prescribe nintedanib which is an antifibrotic medication that we discussed -Take ILD-Pro registry consent with you  -Someone possibly Leda Gauze from [facility] will call you if you qualif  Followup  -Third or fourth week of April 2023 to discuss further   ( Level 05 visit: Estb 40-54 min  in  visit type: on-site physical face to visit  in total care time and counseling or/and coordination of care by this undersigned MD - Dr Brand Males. This includes one or more of the following on this same day 07/12/2021: pre-charting, chart review, note writing, documentation discussion of test results, diagnostic or treatment recommendations, prognosis, risks and benefits of management options, instructions, education, compliance or risk-factor reduction. It excludes time spent by the [place] or office staff in the care of the patient. Actual time 42 min)   SIGNATURE    Dr. Brand Males, M.D., F.C.C.P,  Pulmonary and Critical Care Medicine Staff Physician, [place] Director - Interstitial Lung Disease  Program  Pulmonary [place] at [place], [place], [address]  Pager: [phone], If no answer or between  15:00h - 7:00h: call [phone] Telephone: [phone]  5:54 PM 07/12/2021

## 2021-07-15 NOTE — Telephone Encounter (Signed)
Roderic Palau  RHC from my perspective is al I would need  Thanks    SIGNATURE    Dr. Brand Males, M.D., F.C.C.P,  Pulmonary and Critical Care Medicine Staff Physician, [facility] Director - Interstitial Lung Disease  Program  Pulmonary [place] at [place], [place], [address]  NPI Number:  NPI #[id number] [facility] Number: [id number]  Pager: [phone], If no answer  -> Check AMION or Try [phone] Telephone (clinical office): [phone] Telephone (research): [phone]  7:46 PM 07/15/2021

## 2021-07-17 ENCOUNTER — Other Ambulatory Visit: Payer: Self-pay

## 2021-07-17 ENCOUNTER — Other Ambulatory Visit: Payer: Medicare Other | Admitting: Nurse Practitioner

## 2021-07-17 DIAGNOSIS — L728 Other follicular cysts of the skin and subcutaneous tissue: Secondary | ICD-10-CM | POA: Diagnosis not present

## 2021-07-17 DIAGNOSIS — R208 Other disturbances of skin sensation: Secondary | ICD-10-CM | POA: Diagnosis not present

## 2021-07-18 DIAGNOSIS — E1122 Type 2 diabetes mellitus with diabetic chronic kidney disease: Secondary | ICD-10-CM | POA: Diagnosis not present

## 2021-07-18 DIAGNOSIS — I5033 Acute on chronic diastolic (congestive) heart failure: Secondary | ICD-10-CM | POA: Diagnosis not present

## 2021-07-18 DIAGNOSIS — J849 Interstitial pulmonary disease, unspecified: Secondary | ICD-10-CM | POA: Diagnosis not present

## 2021-07-18 DIAGNOSIS — N183 Chronic kidney disease, stage 3 unspecified: Secondary | ICD-10-CM | POA: Diagnosis not present

## 2021-07-18 DIAGNOSIS — I130 Hypertensive heart and chronic kidney disease with heart failure and stage 1 through stage 4 chronic kidney disease, or unspecified chronic kidney disease: Secondary | ICD-10-CM | POA: Diagnosis not present

## 2021-07-18 DIAGNOSIS — M05741 Rheumatoid arthritis with rheumatoid factor of right hand without organ or systems involvement: Secondary | ICD-10-CM | POA: Diagnosis not present

## 2021-07-22 ENCOUNTER — Encounter: Payer: Medicare Other | Attending: Physical Medicine & Rehabilitation | Admitting: Registered Nurse

## 2021-07-22 ENCOUNTER — Encounter: Payer: Self-pay | Admitting: Registered Nurse

## 2021-07-22 ENCOUNTER — Encounter: Payer: Self-pay | Admitting: Family Medicine

## 2021-07-22 ENCOUNTER — Other Ambulatory Visit: Payer: Self-pay

## 2021-07-22 VITALS — BP 147/87 | HR 99 | Ht 62.0 in | Wt 224.0 lb

## 2021-07-22 DIAGNOSIS — M5416 Radiculopathy, lumbar region: Secondary | ICD-10-CM | POA: Diagnosis not present

## 2021-07-22 DIAGNOSIS — Z79891 Long term (current) use of opiate analgesic: Secondary | ICD-10-CM | POA: Diagnosis not present

## 2021-07-22 DIAGNOSIS — M7062 Trochanteric bursitis, left hip: Secondary | ICD-10-CM | POA: Diagnosis not present

## 2021-07-22 DIAGNOSIS — M546 Pain in thoracic spine: Secondary | ICD-10-CM | POA: Diagnosis not present

## 2021-07-22 DIAGNOSIS — G894 Chronic pain syndrome: Secondary | ICD-10-CM | POA: Diagnosis not present

## 2021-07-22 DIAGNOSIS — M545 Low back pain, unspecified: Secondary | ICD-10-CM

## 2021-07-22 DIAGNOSIS — M25552 Pain in left hip: Secondary | ICD-10-CM

## 2021-07-22 DIAGNOSIS — G8929 Other chronic pain: Secondary | ICD-10-CM

## 2021-07-22 DIAGNOSIS — Z5181 Encounter for therapeutic drug level monitoring: Secondary | ICD-10-CM

## 2021-07-22 MED ORDER — MORPHINE SULFATE ER 15 MG PO TBCR: 15.0000 mg | EXTENDED_RELEASE_TABLET | Freq: Two times a day (BID) | ORAL | 0 refills | Status: DC

## 2021-07-22 MED ORDER — MORPHINE SULFATE 15 MG PO TABS: ORAL_TABLET | ORAL | 0 refills | Status: DC

## 2021-07-22 NOTE — Progress Notes (Signed)
Subjective:    Patient ID: [name], female    DOB: 1940-09-29, 81 y.o.   MRN: [id number]  HPI: [name] is a 81 y.o. female who returns for follow up appointment for chronic pain and medication refill. She states her  pain is located in her bilateral hands and has a scheduled appointment with Rheumatology at [facility]. She also reports she has pain in her mid- lower back radiating into her left hip and left lower extremity [name] reports her lumbar radicular pain has increased in intensity, she mentioned and injection she had in the past. Notes was reviewed she had ESI injection with Dr Ernestina Patches, her daughter [name] will call Dr Ernestina Patches office to scheduled an appointment, she verbalizes understanding.  She rates her pain 7. Her current exercise regime is walking with her walker in the home for short distances.    [name] Morphine equivalent is 60 MME.   UDS ordered today.     Pain Inventory Average Pain 7 Pain Right Now 7 My pain is intermittent, sharp, burning, and tingling  In the last 24 hours, has pain interfered with the following? General activity 8 Relation with others 8 Enjoyment of life 8 What TIME of day is your pain at its worst? morning  Sleep (in general)  Pain is worse with: walking, bending, sitting, inactivity, and standing Pain improves with: pacing activities and medication Relief from Meds: 6  Family History  Problem Relation Age of Onset   Diabetes Mother    Heart attack Mother    Hypertension Father    Lung cancer Father    Diabetes Sister    Breast cancer Sister    Breast cancer Sister    Diabetes Brother    Hypertension Brother    Heart disease Brother    Heart attack Brother    Kidney cancer Brother    Gout Brother    Kidney failure Brother        x 5   Esophageal cancer Brother    Stomach cancer Brother    Prostate cancer Brother    Uterine cancer Daughter    Hypertension Daughter    Hypertension Daughter    Rheum arthritis  Maternal Uncle    Hypertension Son    Heart murmur Son    Social History   Socioeconomic History   Marital status: Married    Spouse name: Not on file   Number of children: 6   Years of education: college   Highest education level: Not on file  Occupational History   Occupation: retired Marine scientist  Tobacco Use   Smoking status: Never   Smokeless tobacco: Never  Vaping Use   Vaping Use: Never used  Substance and Sexual Activity   Alcohol use: No   Drug use: No   Sexual activity: Not Currently  Other Topics Concern   Not on file  Social History Narrative   Patient consumes 2-3 cups coffee per day.   Social Determinants of Health   Financial Resource Strain: Low Risk    Difficulty of Paying Living Expenses: Not hard at all  Food Insecurity: No Food Insecurity   Worried About Charity fundraiser in the Last Year: Never true   [place] in the Last Year: Never true  Transportation Needs: No Transportation Needs   Lack of Transportation (Medical): No   Lack of Transportation (Non-Medical): No  Physical Activity: Not on file  Stress: No Stress Concern Present   Feeling  of Stress : Not at all  Social Connections: Moderately Integrated   Frequency of Communication with Friends and Family: Twice a week   Frequency of Social Gatherings with Friends and Family: Twice a week   Attends Religious Services: 1 to 4 times per year   Active Member of [employer] or Organizations: No   Attends Archivist Meetings: Never   Marital Status: Married   Past Surgical History:  Procedure Laterality Date   ABDOMINAL HYSTERECTOMY     BRAIN SURGERY     Gamma knife 10/13. Needs repeat spring  '14   CARDIAC CATHETERIZATION N/A 10/31/2014   Procedure: Right/Left Heart Cath and Coronary Angiography;  Surgeon: [name], MD;  Location: [facility];  Service: Cardiovascular;  Laterality: N/A;   CYST EXCISION     2 on face   CYST REMOVAL NECK     ESOPHAGOGASTRODUODENOSCOPY  (EGD) WITH PROPOFOL N/A 09/14/2014   Procedure: ESOPHAGOGASTRODUODENOSCOPY (EGD) WITH PROPOFOL;  Surgeon: [name], MD;  Location: [facility];  Service: Endoscopy;  Laterality: N/A;   INCISION AND DRAINAGE HIP Left 01/16/2017   Procedure: IRRIGATION AND DEBRIDEMENT LEFT HIP;  Surgeon: [name], MD;  Location: [facility];  Service: Orthopedics;  Laterality: Left;   INTRAMEDULLARY (IM) NAIL INTERTROCHANTERIC Left 11/29/2016   Procedure: INTRAMEDULLARY (IM) NAIL INTERTROCHANTRIC;  Surgeon: [name], MD;  Location: [facility];  Service: Orthopedics;  Laterality: Left;   OVARY SURGERY     SHOULDER SURGERY Left    TONSILLECTOMY  age 38   VIDEO BRONCHOSCOPY Bilateral 05/31/2013   Procedure: VIDEO BRONCHOSCOPY WITHOUT FLUORO;  Surgeon: Brand Males, MD;  Location: [place];  Service: Cardiopulmonary;  Laterality: Bilateral;   video bronscoscopy  05/19/1998   lung   Past Surgical History:  Procedure Laterality Date   ABDOMINAL HYSTERECTOMY     BRAIN SURGERY     Gamma knife 10/13. Needs repeat spring  '14   CARDIAC CATHETERIZATION N/A 10/31/2014   Procedure: Right/Left Heart Cath and Coronary Angiography;  Surgeon: [name], MD;  Location: [facility];  Service: Cardiovascular;  Laterality: N/A;   CYST EXCISION     2 on face   CYST REMOVAL NECK     ESOPHAGOGASTRODUODENOSCOPY (EGD) WITH PROPOFOL N/A 09/14/2014   Procedure: ESOPHAGOGASTRODUODENOSCOPY (EGD) WITH PROPOFOL;  Surgeon: [name], MD;  [facility];  Service: Endoscopy;  Laterality: N/A;   INCISION AND DRAINAGE HIP Left 01/16/2017   Procedure: IRRIGATION AND DEBRIDEMENT LEFT HIP;  Surgeon: [name], MD;  [facility];  Service: Orthopedics;  Laterality: Left;   INTRAMEDULLARY (IM) NAIL INTERTROCHANTERIC Left 11/29/2016   Procedure: INTRAMEDULLARY (IM) NAIL INTERTROCHANTRIC;  Surgeon: [name], MD;  Location: [facility];  Service:  Orthopedics;  Laterality: Left;   OVARY SURGERY     SHOULDER SURGERY Left    TONSILLECTOMY  age 42   VIDEO BRONCHOSCOPY Bilateral 05/31/2013   Procedure: VIDEO BRONCHOSCOPY WITHOUT FLUORO;  Surgeon: Brand Males, MD;  Location: [facility];  Service: Cardiopulmonary;  Laterality: Bilateral;   video bronscoscopy  05/19/1998   lung   Past Medical History:  Diagnosis Date   Acute on chronic diastolic (congestive) heart failure (HCC)    Anemia    iron deficiency anemia - secondary to blood loss ( chronic)    Anxiety    Arthritis    endstage changes bilateral knees/bilateral ankles.    Asthma    Carotid artery occlusion    Chronic fatigue  Chronic kidney disease    Closed left hip fracture ([place])    Clotting disorder ([place])    pt denies this   Contusion of left knee    due to fall 1/14.   COPD (chronic obstructive pulmonary disease) (HCC)    pulmonary fibrosis   Depression, reactive    Diabetes mellitus    type II    Diastolic dysfunction    Difficulty in walking    Family history of heart disease    Generalized muscle weakness    Gout    High cholesterol    History of falling    Hypertension    Hypothyroidism    Interstitial lung disease ([place])    Meningioma of left sphenoid wing involving cavernous sinus ([place]) 02/17/2012   Continue diplopia, left eye pain and left headaches.     Morbid obesity ([place])    Neuromuscular disorder ([place])    diabetic neuropathy    Normal coronary arteries    cardiac catheterization performed  10/31/14   Pneumonia    RA (rheumatoid arthritis) ([place])    has been off methotreaxte since 10/13.   Spinal stenosis of lumbar region    Thyroid disease    Unspecified lack of coordination    URI (upper respiratory infection)    BP (!) 147/87    Pulse 99    Ht _0  (1.575 m)    Wt 224 lb (101.6 kg)    SpO2 90%    BMI 40.97 kg/m   Opioid Risk Score:   Fall Risk Score:  `1  Depression screen PHQ 2/9  Depression screen [facility] 2/9 07/22/2021  06/21/2021 05/22/2021 03/27/2021 02/13/2021 11/26/2020 10/18/2020  Decreased Interest 0 1 1 0 1 0 0  Down, Depressed, Hopeless 0 0 1 0 1 0 0  PHQ - 2 Score 0 1 2 0 2 0 0  Altered sleeping - 1 2 - - - -  Tired, decreased energy - 2 1 - - - -  Change in appetite - 2 2 - - - -  Feeling bad or failure about yourself  - 1 0 - - - -  Trouble concentrating - 1 0 - - - -  Moving slowly or fidgety/restless - 0 0 - - - -  Suicidal thoughts - 0 0 - - - -  PHQ-9 Score - 8 7 - - - -  Difficult doing work/chores - - - - - - -  Some recent data might be hidden     Review of Systems  Constitutional: Negative.   HENT: Negative.    Eyes: Negative.   Respiratory: Negative.    Cardiovascular: Negative.   Gastrointestinal: Negative.   Endocrine: Negative.   Genitourinary: Negative.   Musculoskeletal: Negative.   Skin: Negative.   Allergic/Immunologic: Negative.   Neurological:  Positive for weakness and numbness.  Hematological: Negative.   Psychiatric/Behavioral: Negative.        Objective:   Physical Exam Vitals and nursing note reviewed.  Constitutional:      Appearance: Normal appearance.  Cardiovascular:     Rate and Rhythm: Normal rate and regular rhythm.     Pulses: Normal pulses.     Heart sounds: Normal heart sounds.  Pulmonary:     Effort: Pulmonary effort is normal.     Breath sounds: Normal breath sounds.  Musculoskeletal:     Cervical back: Normal range of motion and neck supple.     Right lower leg: Edema present.     Left  lower leg: Edema present.     Comments: Normal Muscle Bulk and Muscle Testing Reveals:  Upper Extremities: Full ROM and Muscle Strength 5/5  Lumbar Paraspinal Tenderness: L-3-L-5 Bilateral Greater Trochanter Tenderness: L>R Arrived in wheelchair     Skin:    General: Skin is warm and dry.  Neurological:     Mental Status: She is alert and oriented to person, place, and time.  Psychiatric:        Mood and Affect: Mood normal.        Behavior: Behavior  normal.         Assessment & Plan:  Acute Exacerbation of Chronic Low Back Pain/ Lumbar Radicular Pain: She will scheduled an appointment with Dr Ernestina Patches for [facility] injection. Will continue to monitor.  2 Lumbar spinal stenosis with neurogenic claudication. Associated facet arthropathy/ Lumbar Radiculitis: Continue current medication regime with Gabapentin and HEP as tolerated. 07/22/2021 Refilled: MS Contin 15 mg one tablet every 12 hours #60 and  MSIR 15 one tablet twice a day as needed  For break through pain ( 6 am and 1:00 pm)  #60.since adverse effect with insomnia with Nucynta. Nucynta discontinued due to insomnia.  We will continue the opioid monitoring program, this consists of regular clinic visits, examinations, urine drug screen, pill counts as well as use of [place] Controlled Substance Reporting system. A 12 month History has been reviewed on the [place] Controlled Substance Reporting System 07/22/2021 3. Chronic Bilateral Thoracic Back Pain: Continue HEP as Tolerated. Continue to Monitor. 07/22/2021 4 Depression: Continue current medication Regime: Cymbalta . Continue to monitor.07/22/2021 5. Diabetes mellitus type 2 with polyneuropathy: Continue current medication regime: Gabapentin. 07/22/2021 6. Rheumatoid arthritis and osteoarthritis. Continue current medication Regime.  Voltaren Gel. Rheumatology Following. 07/22/2021 7. Interstitial lung disease: Pulmonology Following. 07/22/2021. 8. Bilateral Osteoarthritis Knee's:  Continue current medication regimen. Voltaren Gel. 07/22/2021. 8. Muscle Spasm: Continue current medication regime : Flexeril. Continue to monitor.07/22/2021 9. Bilateral Greater Trochanteric Tenderness: L>R. Continue current treatment with Ice/Heat Therapy. 07/22/2021. 10. Polyarthralgia: Rheumatology Following: Continue to monitor.07/22/2021 11. Morbid Obesitity: Continue Healthy Diet Regime and HEP. Continue to monitor.  07/22/2021 12. Right  Shoulder tendonitis: No complaints today..Continue to Alternate with Heat and Ice Therapy. Continue to monitor. 07/22/2021     F/U in 2 months

## 2021-07-24 NOTE — Telephone Encounter (Signed)
Called pt regarding need for office visit to discuss right heart cath with Dr. Gwenlyn Found. Able to schedule appointment for pt. Pt verbalizes understanding.  ?

## 2021-07-29 DIAGNOSIS — G8929 Other chronic pain: Secondary | ICD-10-CM | POA: Diagnosis not present

## 2021-07-29 DIAGNOSIS — J849 Interstitial pulmonary disease, unspecified: Secondary | ICD-10-CM | POA: Diagnosis not present

## 2021-07-29 DIAGNOSIS — Z7960 Long term (current) use of unspecified immunomodulators and immunosuppressants: Secondary | ICD-10-CM | POA: Diagnosis not present

## 2021-07-29 DIAGNOSIS — Z79899 Other long term (current) drug therapy: Secondary | ICD-10-CM | POA: Diagnosis not present

## 2021-07-29 DIAGNOSIS — M5442 Lumbago with sciatica, left side: Secondary | ICD-10-CM | POA: Diagnosis not present

## 2021-07-29 DIAGNOSIS — M1A00X0 Idiopathic chronic gout, unspecified site, without tophus (tophi): Secondary | ICD-10-CM | POA: Diagnosis not present

## 2021-07-29 DIAGNOSIS — M0579 Rheumatoid arthritis with rheumatoid factor of multiple sites without organ or systems involvement: Secondary | ICD-10-CM | POA: Diagnosis not present

## 2021-07-30 ENCOUNTER — Encounter: Payer: Self-pay | Admitting: Internal Medicine

## 2021-07-30 NOTE — Telephone Encounter (Signed)
OV notes from last OV 06/21/21: ?Chronic bilateral low back pain without sciatica ?-Would be hesitant to increase current pain medication as in the past contributed to hospitalization ?-Discussed alternative pain management options such as returning to water aerobics and acupuncture. ?-Continue current medication regimen ?-Continue follow-up with pain management ?  ?Pain in left hip ?-Increased in a.m. ?-Continue current pain meds ?-Continue follow-up with pain management ? ?Since referral not mentioned in last visit as option, will send to PCP for verbal order. ?

## 2021-07-31 ENCOUNTER — Telehealth: Payer: Self-pay | Admitting: Physical Medicine and Rehabilitation

## 2021-07-31 NOTE — Telephone Encounter (Signed)
Patient's daughter called. Says mom would like an appointment with Dr. Ernestina Patches. Her call back number is [phone] ? ?

## 2021-07-31 NOTE — Addendum Note (Signed)
Addended by: Elza Rafter D on: 07/31/2021 09:24 AM ? ? Modules accepted: Orders ? ?

## 2021-08-02 ENCOUNTER — Ambulatory Visit (INDEPENDENT_AMBULATORY_CARE_PROVIDER_SITE_OTHER): Payer: Medicare Other | Admitting: Cardiovascular Disease

## 2021-08-02 ENCOUNTER — Encounter: Payer: Self-pay | Admitting: Cardiovascular Disease

## 2021-08-02 ENCOUNTER — Other Ambulatory Visit: Payer: Self-pay

## 2021-08-02 DIAGNOSIS — I10 Essential (primary) hypertension: Secondary | ICD-10-CM | POA: Diagnosis not present

## 2021-08-02 DIAGNOSIS — I5032 Chronic diastolic (congestive) heart failure: Secondary | ICD-10-CM | POA: Diagnosis not present

## 2021-08-02 DIAGNOSIS — E785 Hyperlipidemia, unspecified: Secondary | ICD-10-CM

## 2021-08-02 MED ORDER — FUROSEMIDE 80 MG PO TABS: 80.0000 mg | ORAL_TABLET | Freq: Every day | ORAL | 11 refills | Status: DC

## 2021-08-02 NOTE — H&P (View-Only) (Signed)
? ? ? ?08/02/2021 ?Aniko F Cogliano   ?Dec 22, 1940  ?[id number] ? ?Primary Physician [name], MD ?Primary Cardiologist: Lorretta Harp MD [name], [place] ? ?HPI:  [name] is a 81 y.o.  moderately overweight married [name] female mother of 6 children, grandmother of a grandchildren referred by [name] for cardiovascular evaluation because of progressive dyspnea on exertion and chest pain. I last saw her in the  office 07/11/2020.[name]  She is accompanied by her daughter [name] today..  She did see Kerin Ransom, PA-C in the office 11/16/2019.[name]  Her cardiovascular risk factor are  notable for a strong family history of heart disease with her mother and 24 brothers old with an ischemic heart disease. She has never had a heart attack or stroke. She does have interstitial lung disease as well as rheumatoid arthritis. She is on home oxygen. She also has a brain tumor/meningioma 2 with gamma knife therapy  She's had progressive disabling dyspnea as well as daily chest pain. She underwent cardiac catheterization 10/31/14 by Dr. Irish Lack revealing normal coronary arteries and normal LV function. She did have mild pulmonary hypertension. 2-D echo showed normal LV function with grade 1 diastolic dysfunction and mild pulmonary hypertension. Her symptoms really have not changed on by mouth diuretics. She was referred to [facility] for further evaluation and treatment of interstitial lung disease. Since beginning an antibiotic and an inhaled bronchodilator her shortness of breath is somewhat improved. ?  ?She had an episode of syncope prior to seeing Kerin Ransom thought to be related to overmedication and dehydration from poor p.o. intake.  Her meds were adjusted.  She was also hospitalized last month with sepsis related to a tooth infection with and was in the hospital for 10 days.  She had fluid overload and has since diuresed.  She gets occasional atypical chest pain.  Her  blood pressure is mildly elevated when she takes it at home.  She is on furosemide 60 mg a day. ?  ?She had been seeing one of our our Pharm.D. for medication titration for hypertension which is under better control.    She had noticed increasing weight gain lower extremity edema as well as some atypical chest pain and left flank pain.  She did see her nephrologist who put her on antibiotic for presumed UTI and increased her diuretic from furosemide 60 to 80 mg a day.  Her symptoms sound like she has an "upper urinary tract infection" which may have exacerbated her diastolic heart failure.  Her nephrologist is following her volume status and renal function closely. ?  ?Since I saw her 6 months ago she continues to do well.  She is on oral diuretics.  Her weight is down 10 pounds since I last saw her.  She denies chest pain or shortness of breath.  Her major issue is back and leg pain.  She has been hospitalized with a UTI in the past.  She saw [name] in February who requested a right heart cath to further characterize her pulmonary hypertension. ? ? ?Current Meds  ?Medication Sig  ? acetaminophen (TYLENOL) 500 MG tablet Take 1,000 mg by mouth every 6 (six) hours as needed for moderate pain.   ? albuterol (PROVENTIL) (2.5 MG/3ML) 0.083% nebulizer solution Take 3 mLs (2.5 mg total) by nebulization every 6 (six) hours as needed for wheezing.  ? albuterol (VENTOLIN HFA) 108 (90 Base) MCG/ACT inhaler Inhale 2 puffs into the lungs every 6 (  six) hours as needed for wheezing or shortness of breath.  ? allopurinol (ZYLOPRIM) 300 MG tablet Take 1 tablet (300 mg total) by mouth daily.  ? Artificial Tear Ointment (DRY EYES OP) Apply 1 drop to eye daily as needed (dry eyes).  ? aspirin 325 MG tablet Take 1 tablet (325 mg total) by mouth daily.  ? atorvastatin (LIPITOR) 20 MG tablet Take 0.5 tablets (10 mg total) by mouth every evening.  ? Calcium Carb-Cholecalciferol (CALCIUM + D3 PO) Take 2 tablets by mouth daily with  lunch.   ? Cholecalciferol (VITAMIN D-3) 25 MCG (1000 UT) CAPS Take 2 capsules (2,000 Units total) by mouth daily. GIVE ONE TAB PO Q DAY  ? colchicine 0.6 MG tablet Take 1 tablet (0.6 mg total) by mouth daily as needed (gout flares).  ? cyclobenzaprine (FLEXERIL) 5 MG tablet Take 1 tablet (5 mg total) by mouth at bedtime as needed for muscle spasms.  ? diclofenac Sodium (VOLTAREN) 1 % GEL Apply 2 g topically 3 (three) times daily. Apply to both hands and knees  ? Dulaglutide (TRULICITY) 9.38 HW/2.9HB SOPN Inject 0.75 mg into the skin once a week.  ? DULoxetine (CYMBALTA) 60 MG capsule Take 1 capsule (60 mg total) by mouth daily.  ? fluticasone (FLONASE) 50 MCG/ACT nasal spray Place 2 sprays into both nostrils daily.  ? furosemide (LASIX) 80 MG tablet Take 1 tablet (80 mg total) by mouth daily.  ? gabapentin (NEURONTIN) 600 MG tablet Take 2 tablets (1,200 mg total) by mouth 3 (three) times daily.  ? guaiFENesin (MUCINEX) 600 MG 12 hr tablet Take 2 tablets (1,200 mg total) by mouth 2 (two) times daily.  ? hydroxychloroquine (PLAQUENIL) 200 MG tablet Take 1 tablet (200 mg total) by mouth daily.  ? levothyroxine (SYNTHROID) 50 MCG tablet Take 1 tablet (50 mcg total) by mouth daily.  ? linaclotide (LINZESS) 72 MCG capsule Take 1 capsule (72 mcg total) by mouth daily before breakfast.  ? metolazone (ZAROXOLYN) 2.5 MG tablet TAKE 1 TABLET BY MOUTH DAILY AS NEEDED FOR 2 DAYS FOR WEIGHT ABOVE 255 POUNDS  ? metoprolol succinate (TOPROL-XL) 25 MG 24 hr tablet Take 5 tablets (125 mg total) by mouth daily.  ? morphine (MS CONTIN) 15 MG 12 hr tablet Take 1 tablet (15 mg total) by mouth every 12 (twelve) hours. Do Not Fill Before 08/21/2021  ? morphine (MSIR) 15 MG tablet One tablet twice a day as needed for break through pain. Do Not Fill Before 08/21/2021  ? Multiple Vitamin (MULTIVITAMIN WITH MINERALS) TABS Take 1 tablet by mouth daily.   ? olmesartan (BENICAR) 40 MG tablet Take 1 tablet (40 mg total) by mouth daily.  ?  OXYGEN-HELIUM IN Inhale 3 L into the lungs at bedtime. And on exertion  ? pantoprazole (PROTONIX) 40 MG tablet take 1 tablet by mouth once daily MINUTES BEFORE 1ST MEAL OF THE DAY  ? Potassium Chloride ER 20 MEQ TBCR Take 20 mEq by mouth daily.  ? predniSONE (DELTASONE) 10 MG tablet Take 1 tablet (10 mg total) by mouth daily with breakfast.  ? predniSONE (DELTASONE) 10 MG tablet Take 2 tabs (20 mg) daily x 14 days. Then take 1 and a half tabs (15 mg) x 14 days.  Then return to your regular dose of 1 tab (10 mg) daily.  ? sodium chloride (OCEAN) 0.65 % SOLN nasal spray Place 1 spray into both nostrils as needed for congestion.  ?  ? ?Allergies  ?Allergen Reactions  ? Codeine Swelling  and Other (See Comments)  ?  Facial swelling ?Chest pain and swelling in legs ? ?Facial swelling  ? Infliximab Anaphylaxis  ?  "sent me into shock" ?"sent me into shock"  ? Lisinopril Swelling  ?  Face and neck swelling ?Face and neck swelling  ? ? ?Social History  ? ?Socioeconomic History  ? Marital status: Married  ?  Spouse name: Not on file  ? Number of children: 6  ? Years of education: college  ? Highest education level: Not on file  ?Occupational History  ? Occupation: retired Marine scientist  ?Tobacco Use  ? Smoking status: Never  ? Smokeless tobacco: Never  ?Vaping Use  ? Vaping Use: Never used  ?Substance and Sexual Activity  ? Alcohol use: No  ? Drug use: No  ? Sexual activity: Not Currently  ?Other Topics Concern  ? Not on file  ?Social History Narrative  ? Patient consumes 2-3 cups coffee per day.  ? ?Social Determinants of Health  ? ?Financial Resource Strain: Low Risk   ? Difficulty of Paying Living Expenses: Not hard at all  ?Food Insecurity: No Food Insecurity  ? Worried About Charity fundraiser in the Last Year: Never true  ? Ran Out of Food in the Last Year: Never true  ?Transportation Needs: No Transportation Needs  ? Lack of Transportation (Medical): No  ? Lack of Transportation (Non-Medical): No  ?Physical Activity: Not on  file  ?Stress: No Stress Concern Present  ? Feeling of Stress : Not at all  ?Social Connections: Moderately Integrated  ? Frequency of Communication with Friends and Family: Twice a week  ? Frequency of Social Gat

## 2021-08-02 NOTE — Assessment & Plan Note (Signed)
History of essential hypertension a blood pressure measured today at 140/82.  She is on metoprolol. ?

## 2021-08-02 NOTE — Assessment & Plan Note (Signed)
History of hyperlipidemia on statin therapy with lipid profile performed 09/26/2020 revealing total cholesterol 136, LDL of 75 and HDL 40. ?

## 2021-08-02 NOTE — Patient Instructions (Signed)
Medication Instructions:  ?No changes ?*If you need a refill on your cardiac medications before your next appointment, please call your pharmacy* ? ? ?Lab Work: ?None ordered ?If you have labs (blood work) drawn today and your tests are completely normal, you will receive your results only by: ?MyChart Message (if you have MyChart) OR ?A paper copy in the mail ?If you have any lab test that is abnormal or we need to change your treatment, we will call you to review the results. ? ? ?Testing/Procedures: ?Your physician has requested that you have a cardiac catheterization. Cardiac catheterization is used to diagnose and/or treat various heart conditions. Doctors may recommend this procedure for a number of different reasons. The most common reason is to evaluate chest pain. Chest pain can be a symptom of coronary artery disease (CAD), and cardiac catheterization can show whether plaque is narrowing or blocking your heart?s arteries. This procedure is also used to evaluate the valves, as well as measure the blood flow and oxygen levels in different parts of your heart. For further information please visit [email or web address]. Please follow instruction sheet, as given. ? ?Follow-Up: ?At [facility], you and your health needs are our priority.  As part of our continuing mission to provide you with exceptional heart care, we have created designated Provider Care Teams.  These Care Teams include your primary Cardiologist (physician) and Advanced Practice Providers (APPs -  Physician Assistants and Nurse Practitioners) who all work together to provide you with the care you need, when you need it. ? ?We recommend signing up for the patient portal called "MyChart".  Sign up information is provided on this After Visit Summary.  MyChart is used to connect with patients for Virtual Visits (Telemedicine).  Patients are able to view lab/test results, encounter notes, upcoming appointments, etc.  Non-urgent messages can be sent  to your provider as well.   ?To learn more about what you can do with MyChart, go to [email or web address].   ? ?Your next appointment:   ?3 week(s) ? ?The format for your next appointment:   ?In Person ? ?Provider:   ?Quay Burow, MD or APP ? ? ? ?Other Instructions ? ?[place] ?[address] ?[address] ?[place] [address] ?Dept: [phone] ?Loc: [phone] ? ?[name]  08/02/2021 ? ?You are scheduled for a Cardiac Catheterization on Monday, March 27 with Dr. Quay Burow. ? ?1. Please arrive at the Main Entrance A at [facility]: [place], [place] [address] at 5:30 AM (This time is two hours before your procedure to ensure your preparation). Free valet parking service is available.  ? ?Special note: Every effort is made to have your procedure done on time. Please understand that emergencies sometimes delay scheduled procedures. ? ?2. Diet: Do not eat solid foods after midnight.  You may have clear liquids until 5 AM upon the day of the procedure. ? ?3. Labs: You will need to have blood drawn: already completed ? ?4. Medication instructions in preparation for your procedure: ?Hold the Furosemide and the Potassium the morning of the procedure ? ?On the morning of your procedure, take Aspirin and any morning medicines NOT listed above.  You may use sips of water. ? ?5. Plan to go home the same day, you will only stay overnight if medically necessary. ?6. You MUST have a responsible adult to drive you home. ?7. An adult MUST be with you the first 24 hours  after you arrive home. ?8. Bring a current list of your medications, and the last time and date medication taken. ?9. Bring ID and current insurance cards. ?10.Please wear clothes that are easy to get on and off and wear slip-on shoes. ? ?Thank you for allowing [place] to care for you! ?  -- [facility] ? ? ?

## 2021-08-02 NOTE — Assessment & Plan Note (Signed)
History of diastolic heart failure and lower EXTR extremity edema on high-dose diuretics. ?

## 2021-08-02 NOTE — Progress Notes (Signed)
? ? ? ?08/02/2021 ?[name]   ?Dec 22, 1940  ?[id number] ? ?Primary Physician [name], MD ?Primary Cardiologist: Lorretta Harp MD [name], [place] ? ?HPI:  [name] is a 81 y.o.  moderately overweight married [name] female mother of 6 children, grandmother of a grandchildren referred by [name] for cardiovascular evaluation because of progressive dyspnea on exertion and chest pain. I last saw her in the  office 07/11/2020.[name]  She is accompanied by her daughter [name] today..  She did see Kerin Ransom, PA-C in the office 11/16/2019.[name]  Her cardiovascular risk factor are  notable for a strong family history of heart disease with her mother and 24 brothers old with an ischemic heart disease. She has never had a heart attack or stroke. She does have interstitial lung disease as well as rheumatoid arthritis. She is on home oxygen. She also has a brain tumor/meningioma 2 with gamma knife therapy  She's had progressive disabling dyspnea as well as daily chest pain. She underwent cardiac catheterization 10/31/14 by Dr. Irish Lack revealing normal coronary arteries and normal LV function. She did have mild pulmonary hypertension. 2-D echo showed normal LV function with grade 1 diastolic dysfunction and mild pulmonary hypertension. Her symptoms really have not changed on by mouth diuretics. She was referred to [facility] for further evaluation and treatment of interstitial lung disease. Since beginning an antibiotic and an inhaled bronchodilator her shortness of breath is somewhat improved. ?  ?She had an episode of syncope prior to seeing Kerin Ransom thought to be related to overmedication and dehydration from poor p.o. intake.  Her meds were adjusted.  She was also hospitalized last month with sepsis related to a tooth infection with and was in the hospital for 10 days.  She had fluid overload and has since diuresed.  She gets occasional atypical chest pain.  Her  blood pressure is mildly elevated when she takes it at home.  She is on furosemide 60 mg a day. ?  ?She had been seeing one of our our Pharm.D. for medication titration for hypertension which is under better control.    She had noticed increasing weight gain lower extremity edema as well as some atypical chest pain and left flank pain.  She did see her nephrologist who put her on antibiotic for presumed UTI and increased her diuretic from furosemide 60 to 80 mg a day.  Her symptoms sound like she has an "upper urinary tract infection" which may have exacerbated her diastolic heart failure.  Her nephrologist is following her volume status and renal function closely. ?  ?Since I saw her 6 months ago she continues to do well.  She is on oral diuretics.  Her weight is down 10 pounds since I last saw her.  She denies chest pain or shortness of breath.  Her major issue is back and leg pain.  She has been hospitalized with a UTI in the past.  She saw [name] in February who requested a right heart cath to further characterize her pulmonary hypertension. ? ? ?Current Meds  ?Medication Sig  ? acetaminophen (TYLENOL) 500 MG tablet Take 1,000 mg by mouth every 6 (six) hours as needed for moderate pain.   ? albuterol (PROVENTIL) (2.5 MG/3ML) 0.083% nebulizer solution Take 3 mLs (2.5 mg total) by nebulization every 6 (six) hours as needed for wheezing.  ? albuterol (VENTOLIN HFA) 108 (90 Base) MCG/ACT inhaler Inhale 2 puffs into the lungs every 6 (  six) hours as needed for wheezing or shortness of breath.  ? allopurinol (ZYLOPRIM) 300 MG tablet Take 1 tablet (300 mg total) by mouth daily.  ? Artificial Tear Ointment (DRY EYES OP) Apply 1 drop to eye daily as needed (dry eyes).  ? aspirin 325 MG tablet Take 1 tablet (325 mg total) by mouth daily.  ? atorvastatin (LIPITOR) 20 MG tablet Take 0.5 tablets (10 mg total) by mouth every evening.  ? Calcium Carb-Cholecalciferol (CALCIUM + D3 PO) Take 2 tablets by mouth daily with  lunch.   ? Cholecalciferol (VITAMIN D-3) 25 MCG (1000 UT) CAPS Take 2 capsules (2,000 Units total) by mouth daily. GIVE ONE TAB PO Q DAY  ? colchicine 0.6 MG tablet Take 1 tablet (0.6 mg total) by mouth daily as needed (gout flares).  ? cyclobenzaprine (FLEXERIL) 5 MG tablet Take 1 tablet (5 mg total) by mouth at bedtime as needed for muscle spasms.  ? diclofenac Sodium (VOLTAREN) 1 % GEL Apply 2 g topically 3 (three) times daily. Apply to both hands and knees  ? Dulaglutide (TRULICITY) 9.20 FE/0.7HQ SOPN Inject 0.75 mg into the skin once a week.  ? DULoxetine (CYMBALTA) 60 MG capsule Take 1 capsule (60 mg total) by mouth daily.  ? fluticasone (FLONASE) 50 MCG/ACT nasal spray Place 2 sprays into both nostrils daily.  ? furosemide (LASIX) 80 MG tablet Take 1 tablet (80 mg total) by mouth daily.  ? gabapentin (NEURONTIN) 600 MG tablet Take 2 tablets (1,200 mg total) by mouth 3 (three) times daily.  ? guaiFENesin (MUCINEX) 600 MG 12 hr tablet Take 2 tablets (1,200 mg total) by mouth 2 (two) times daily.  ? hydroxychloroquine (PLAQUENIL) 200 MG tablet Take 1 tablet (200 mg total) by mouth daily.  ? levothyroxine (SYNTHROID) 50 MCG tablet Take 1 tablet (50 mcg total) by mouth daily.  ? linaclotide (LINZESS) 72 MCG capsule Take 1 capsule (72 mcg total) by mouth daily before breakfast.  ? metolazone (ZAROXOLYN) 2.5 MG tablet TAKE 1 TABLET BY MOUTH DAILY AS NEEDED FOR 2 DAYS FOR WEIGHT ABOVE 255 POUNDS  ? metoprolol succinate (TOPROL-XL) 25 MG 24 hr tablet Take 5 tablets (125 mg total) by mouth daily.  ? morphine (MS CONTIN) 15 MG 12 hr tablet Take 1 tablet (15 mg total) by mouth every 12 (twelve) hours. Do Not Fill Before 08/21/2021  ? morphine (MSIR) 15 MG tablet One tablet twice a day as needed for break through pain. Do Not Fill Before 08/21/2021  ? Multiple Vitamin (MULTIVITAMIN WITH MINERALS) TABS Take 1 tablet by mouth daily.   ? olmesartan (BENICAR) 40 MG tablet Take 1 tablet (40 mg total) by mouth daily.  ?  OXYGEN-HELIUM IN Inhale 3 L into the lungs at bedtime. And on exertion  ? pantoprazole (PROTONIX) 40 MG tablet take 1 tablet by mouth once daily MINUTES BEFORE 1ST MEAL OF THE DAY  ? Potassium Chloride ER 20 MEQ TBCR Take 20 mEq by mouth daily.  ? predniSONE (DELTASONE) 10 MG tablet Take 1 tablet (10 mg total) by mouth daily with breakfast.  ? predniSONE (DELTASONE) 10 MG tablet Take 2 tabs (20 mg) daily x 14 days. Then take 1 and a half tabs (15 mg) x 14 days.  Then return to your regular dose of 1 tab (10 mg) daily.  ? sodium chloride (OCEAN) 0.65 % SOLN nasal spray Place 1 spray into both nostrils as needed for congestion.  ?  ? ?Allergies  ?Allergen Reactions  ? Codeine Swelling  and Other (See Comments)  ?  Facial swelling ?Chest pain and swelling in legs ? ?Facial swelling  ? Infliximab Anaphylaxis  ?  "sent me into shock" ?"sent me into shock"  ? Lisinopril Swelling  ?  Face and neck swelling ?Face and neck swelling  ? ? ?Social History  ? ?Socioeconomic History  ? Marital status: Married  ?  Spouse name: Not on file  ? Number of children: 6  ? Years of education: college  ? Highest education level: Not on file  ?Occupational History  ? Occupation: retired Marine scientist  ?Tobacco Use  ? Smoking status: Never  ? Smokeless tobacco: Never  ?Vaping Use  ? Vaping Use: Never used  ?Substance and Sexual Activity  ? Alcohol use: No  ? Drug use: No  ? Sexual activity: Not Currently  ?Other Topics Concern  ? Not on file  ?Social History Narrative  ? Patient consumes 2-3 cups coffee per day.  ? ?Social Determinants of Health  ? ?Financial Resource Strain: Low Risk   ? Difficulty of Paying Living Expenses: Not hard at all  ?Food Insecurity: No Food Insecurity  ? Worried About Charity fundraiser in the Last Year: Never true  ? Ran Out of Food in the Last Year: Never true  ?Transportation Needs: No Transportation Needs  ? Lack of Transportation (Medical): No  ? Lack of Transportation (Non-Medical): No  ?Physical Activity: Not on  file  ?Stress: No Stress Concern Present  ? Feeling of Stress : Not at all  ?Social Connections: Moderately Integrated  ? Frequency of Communication with Friends and Family: Twice a week  ? Frequency of Social Gat

## 2021-08-05 ENCOUNTER — Encounter: Payer: Self-pay | Admitting: Family Medicine

## 2021-08-05 ENCOUNTER — Other Ambulatory Visit: Payer: Self-pay | Admitting: Adult Health

## 2021-08-05 ENCOUNTER — Telehealth: Payer: Self-pay | Admitting: Family Medicine

## 2021-08-05 DIAGNOSIS — I10 Essential (primary) hypertension: Secondary | ICD-10-CM

## 2021-08-05 DIAGNOSIS — I5032 Chronic diastolic (congestive) heart failure: Secondary | ICD-10-CM

## 2021-08-05 DIAGNOSIS — K5909 Other constipation: Secondary | ICD-10-CM

## 2021-08-05 NOTE — Progress Notes (Signed)
? ?Interstitial Lung Disease Multidisciplinary Conference ?  ?[name]    MRN [id number]    DOB 1940-05-30 ? ?Primary Care Physician:Banks, Langley Adie, MD ? ?Referring Physician: Chase Caller ? ?Time of Conference: 7.30am- 8.30am ?Date of conference: 07/30/21 ?Location of Conference: -  Virtual ? ?Participating ?Pulmonary: Dr. Brand Males, MD - yes,  Dr Marshell Garfinkel, MD - yes, Dr Madie Reno- yes ?Pathology: Dr Jaquita Folds, MD - yes ,  ?Radiology: Dr Yetta Glassman ? ?Brief History: "RA with ILD . Stable for years but then covid nov 2022 Rx as outpaitent. Since then o2 needs 2 -> 3L /PFT - slightly worse FVC ? technique issue becase could not do dlco. CT reported as UIP and worse but MR not sure.  ?Question: is CT really worse? If so, she qualifies for registry study and ofev. " ? ?Serology: Has known RA ? ?MDD discussion of CT scan   ? ?- Date or time period of scan: "HRCT: 06/19/2021 ?HRCT: 11/24/2018 ?HRCT: 11/29/2014 ?HRCT: 07/20/2014 ?HRCT: 10/05/2013 ?HRCT: 04/08/2013" ? ?- Features mentioned:  ?-On laest scan there is ILD and Tracheobroncholamacia and also diffuse Traction bronchiectasis without ILD. In LL there is bialteral LL HC., subpleural disease UIP that is unusual - suggestive of CTD.  Progressive ? ?- What is the final conclusion per 2018 ATS/Fleischner Criteria - ILD with tracheobronchomalacia - CTD pattern. CONCORDANT to pre MDD dx ? ?Pathology discussion of biopsy none: ? ?PFTs:  ?PFT Results Latest Ref Rng & Units 07/12/2021 01/12/2014 09/12/2013 04/28/2013  ?FVC-Pre L 1.29 1.59 1.78 1.73  ?FVC-Predicted Pre % 70 76 77 74  ?FVC-Post L - 1.46 1.54 1.19  ?FVC-Predicted Post % - 70 66 51  ?Pre FEV1/FVC % % 92 91 91 84  ?Post FEV1/FCV % % - 93 89 97  ?FEV1-Pre L 1.18 1.45 1.63 1.46  ?FEV1-Predicted Pre % 83 90 90 81  ?FEV1-Post L - 1.36 1.36 1.15  ?DLCO uncorrected ml/min/mmHg -0.03 16.69 12.50 -  ?DLCO UNC% % 0 77 51 -  ?DLCO corrected ml/min/mmHg -0.03 - - -  ?DLCO COR %Predicted % 0 -  - -  ?DLVA Predicted % 29 95 91 -  ?TLC L - 3.55 3.57 2.94  ?TLC % Predicted % - 74 70 58  ?RV % Predicted % - 62 66 62  ? ? ? ?Labs: x ? ?MDD Impression/Recs: RA ILD with bronchiectasis and tracheobronchomalacia. Progressive over time ? ? ?Time Spent in preparation and discussion:  > 30 min ? ? ? ?SIGNATURE  ? ?Dr. Brand Males, M.D., F.C.C.P,  ?Pulmonary and Critical Care Medicine ?Staff Physician, [facility] ?Center Director - Interstitial Lung Disease  Program  ?Pulmonary [place] [facility] ?[place], [place], [address] ? ?Pager: [phone], If no answer or between  15:00h - 7:00h: call [phone] ?Telephone: [phone] ? ?10:48 AM ?08/05/2021 ?.................................................................................................................... ?References: ?Diagnosis of Hypersensitivity Pneumonitis in Adults. An Official ATS/JRS/ALAT Clinical Practice Guideline. ?Ragu G et al, [place] Aug 1;202(3):e36-e69. ? ? ? ? ? ? ?Diagnosis of Idiopathic Pulmonary Fibrosis. An Official ATS/ERS/JRS/ALAT Clinical Practice Guideline. ?Raghu G et al, [place] 2018 Sep 1;198(5):e44-e68. ? ? ?IPF Suspected   Histopath ology Pattern   ?   ?UIP  ?Probable UIP  ?Indeterminate for  ?UIP  ?Alternative  ?diagnosis  ?  ?UIP  ?IPF  ?IPF  ?IPF  ?Non-IPF dx  ? ?HRCT   ?  Probabe UIP  ?IPF  ?IPF  ?IPF (Likely)**  ?Non-IPF dx  ?Pattern  ?Indeterminate for UIP  ?IPF  ?IPF (Likely)**  ?Indeterminate  ?for IPF**  ?Non-IPF dx  ?  ?Alternative diagnosis  ?IPF (Likely)**/ non-IPF dx  ?Non-IPF dx  ?Non-IPF dx  ?Non-IPF dx  ? ? ? ?Idiopathic pulmonary fibrosis diagnosis based upon HRCT and Biopsy paterns. ? ?** IPF is the likely diagnosis when any of following features are present: ? ?Moderate-to-severe traction bronchiectasis/bronchiolectasis (defined as mild traction bronchiectasis/bronchiolectasis in four or more lobes including the  lingual as a lobe, or moderate to severe traction bronchiectasis in two or more lobes) in a man over age 44 years or in a woman over age 25 years ?Extensive (>30%) reticulation on HRCT and an age >70 years  ?Increased neutrophils and/or absence of lymphocytosis in BAL fluid  ?Multidisciplinary discussion reaches a confident diagnosis of IPF.  ? ?**Indeterminate for IPF ? ?Without an adequate biopsy is unlikely to be IPF  ?With an adequate biopsy may be reclassified to a more specific diagnosis after multidisciplinary discussion and/or additional consultation.  ? ?dx = diagnosis; HRCT = high-resolution computed tomography; IPF = idiopathic pulmonary fibrosis; UIP = usual interstitial pneumonia. ? ? ?

## 2021-08-05 NOTE — Telephone Encounter (Signed)
Patient called in requesting a refill for metoprolol succinate (TOPROL-XL) 25 MG 24 hr tablet [[id number]]  and linaclotide (LINZESS) 72 MCG capsule [[id number]] to ?[facility] [place] [place] - [address] ? ?Please advise.  ?

## 2021-08-05 NOTE — Telephone Encounter (Signed)
Ok to refill. ?

## 2021-08-06 DIAGNOSIS — N2581 Secondary hyperparathyroidism of renal origin: Secondary | ICD-10-CM | POA: Diagnosis not present

## 2021-08-06 DIAGNOSIS — E1122 Type 2 diabetes mellitus with diabetic chronic kidney disease: Secondary | ICD-10-CM | POA: Diagnosis not present

## 2021-08-06 DIAGNOSIS — N183 Chronic kidney disease, stage 3 unspecified: Secondary | ICD-10-CM | POA: Diagnosis not present

## 2021-08-06 DIAGNOSIS — I129 Hypertensive chronic kidney disease with stage 1 through stage 4 chronic kidney disease, or unspecified chronic kidney disease: Secondary | ICD-10-CM | POA: Diagnosis not present

## 2021-08-06 MED ORDER — LINACLOTIDE 72 MCG PO CAPS: 72.0000 ug | ORAL_CAPSULE | Freq: Every day | ORAL | 3 refills | Status: DC

## 2021-08-06 MED ORDER — METOPROLOL SUCCINATE ER 25 MG PO TB24: 125.0000 mg | ORAL_TABLET | Freq: Every day | ORAL | 3 refills | Status: DC

## 2021-08-07 ENCOUNTER — Other Ambulatory Visit: Payer: Self-pay

## 2021-08-07 ENCOUNTER — Encounter: Payer: Self-pay | Admitting: Nurse Practitioner

## 2021-08-07 ENCOUNTER — Other Ambulatory Visit: Payer: Medicare Other | Admitting: Nurse Practitioner

## 2021-08-07 DIAGNOSIS — R0602 Shortness of breath: Secondary | ICD-10-CM

## 2021-08-07 DIAGNOSIS — R5381 Other malaise: Secondary | ICD-10-CM

## 2021-08-07 DIAGNOSIS — Z515 Encounter for palliative care: Secondary | ICD-10-CM

## 2021-08-07 DIAGNOSIS — I5032 Chronic diastolic (congestive) heart failure: Secondary | ICD-10-CM

## 2021-08-07 NOTE — Progress Notes (Signed)
? ? ?Manufacturing engineer ?Community Palliative Care Consult Note ?Telephone: [phone]  ?Fax: [phone]  ? ? ?Date of encounter: 08/07/21 ?5:20 PM ?PATIENT NAME: [name] ?[place] [address]   ?[phone] (home)  ?DOB: 11-18-1940 ?MRN: [id number] ?PRIMARY CARE PROVIDER:    ?[name], MD,  ?[place] ?[place] [address] ?[phone] ? ?RESPONSIBLE PARTY:    ?Contact Information   ? ? Name Relation Home Work Mobile  ? Farrow,Curtis Son [phone]  [phone]  ? Fonnie Jarvis Daughter [phone]  [phone]  ? Manley,Samuel Spouse [phone]  [phone]  ? Shelly Coss Daughter   [phone]  ? Jerelyn Scott Daughter   [phone]  ? ?  ? ?I met face to face with patient and family in home. Palliative Care was asked to follow this patient by consultation request of  [name], MD to address advance care planning and complex medical decision making. This is a follow up visit.                                  ?ASSESSMENT AND PLAN / RECOMMENDATIONS:  ?Advance Care Planning/Goals of Care: Goals include to maximize quality of life and symptom management. Patient/health care surrogate gave his/her permission to discuss. ?Our advance care planning conversation included a discussion about:    ?The value and importance of advance care planning  ?Experiences with loved ones who have been seriously ill or have died  ?Exploration of personal, cultural or spiritual beliefs that might influence medical decisions  ?Exploration of goals of care in the event of a sudden injury or illness  ?Identification of a healthcare agent  ?Review and updating or creation of an  advance directive document . ?Decision not to resuscitate or to de-escalate disease focused treatments due to poor prognosis. ?CODE STATUS: DNR ? ?Symptom Management/Plan: ?1. ACP: DNR; discussed at length about DNR, her family reactions especially her youngest son. We talked  about medical goals with wishes. We talked about upcoming cardiac catherization. We talked about how to discuss DNR with her son, his fears and offered PC. We talked about medical decisions, and wishes. Therapeutic listening, emotional support provided.  ?  ?2. Debility secondary to deconditioning from CHF, progression of pulmonary fibrosis, chronic bronchiectasis with chronic hypoxic respiratory failure, RA; continue current regimen, pain management; encourage mobility with safety, fall risk.  ?  ?3. Palliative care encounter Palliative medicine team will continue to support patient, patient's family, and medical team. Visit consisted of counseling and education dealing with the complex and emotionally intense issues of symptom management and palliative care in the setting of serious and potentially life-threatening  ?  ?4. Shortness of breath secondary to pulmonary fibrosis; CHF, COPD, continue to repeat walking testing to obtain O2 for home, inhalation therapy, monitor respiratory status, monitor edema, activity level, energy conservation; Has cardiac catherization this coming week ? ?Follow up Palliative Care Visit: Palliative care will continue to follow for complex medical decision making, advance care planning, and clarification of goals. Return 8 weeks or prn. ? ?I spent 66 minutes providing this consultation. More than 50% of the time in this consultation was spent in counseling and care coordination. ?PPS: 40% ? ?Chief Complaint: Follow up palliative consult for complex medical decision making ? ?HISTORY OF PRESENT ILLNESS:  [name] is a 81 y.o. 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 I called Ms. Viruet to confirm in person PC visit, Ms. Kovacich in agreement, covid screening negative. We talked about how Ms. Campanile has been feeling. Ms. Mangham endorses she continues to be very tired, fatigue, from multiple MD visits with recent Nephrology, Pulmonology, Cardiology visits with upcoming cardiac catherization. We talked about symptoms of pain, chronically, current regimen. We talked about appetite which remains declined. We talked about sleep patterns, sleep hygiene. We talked about her daily routine though has changed to incorporate more times of resting. We talked about medical goals, reviewed. We talked about chronic disease progression, realistic expectations with overall decline, debility, limitations including age. We talked about quality of life. We talked about DNR, family dynamics with youngest son's reaction to DNR. We talked about coping strategies, further discussions with her son, concerns Ms. Achee as and wishes for her care. We talked about extensive family history as all of her brothers are on hemodialysis. We talked about role pc in poc. We talked about f/u pc visit with upcoming appointments. Ms. Chrestman in agreement prefers telemedicine as she gets tired with in person visits. Scheduled. Therapeutic listening, emotional support provided. Questions answered.  ? ?History obtained from review of EMR, discussion with  Ms. Siguenza.  ?I reviewed available labs, medications, imaging, studies and related documents from the EMR.  Records reviewed and summarized above.  ? ?ROS ?10 point system reviewed all negative except HPI ? ?Physical Exam: ?Constitutional: NAD ?General:  frail appearing, chronically ill, pleasant female ?EYES: lids intact ?ENMT:  oral mucous membranes moist ?CV: S1S2, RRR, +BLE edema ?Pulmonary: fine crackles throughout, no increased work of breathing, no cough, O2 ?Abdomen: soft and non tender ?MSK: ambulatory with walker ?Skin: warm and dry ?Neuro:  + generalized weakness,  no cognitive impairment ?Psych: non-anxious affect, A and O x 3 ?Thank you for the opportunity to participate in the care of Ms. Cabeza.  The palliative care team will continue to follow. Please call our office at [phone] if we can be of additional assistance.  ? ?Kastin Cerda Z Vansh Reckart, NP  ? ?COVID-19 PATIENT SCREENING TOOL ?Asked and negative response unless otherwise noted:  ? ?Have you had symptoms of covid, tested positive or been in contact with someone with symptoms/positive test in the past 5-10 days?NO   ?

## 2021-08-08 ENCOUNTER — Telehealth: Payer: Self-pay | Admitting: *Deleted

## 2021-08-08 NOTE — Telephone Encounter (Addendum)
Right Heart Cath scheduled at [facility] for: Monday August 12, 2021 7:30 AM ?Arrival time and place: [facility] Entrance A at: 5:30 AM ? ? ?No solid food after midnight prior to cath, clear liquids until 5 AM day of procedure. ? ?Medication instructions: ?-Hold: ? Lasix/KCl/ Metolazone-AM of procedure  ?  ?-Except hold medications usual morning medications can be taken with sips of water.  ? ?Confirmed patient has responsible adult to drive home post procedure and be with patient first 24 hours after arriving home. ? ?Patient reports no new symptoms concerning for COVID-19/no exposure to COVID-19 in the past 10 days. ? ?Reviewed procedure instructions with patient.  ?

## 2021-08-12 ENCOUNTER — Other Ambulatory Visit: Payer: Self-pay

## 2021-08-12 ENCOUNTER — Encounter (HOSPITAL_COMMUNITY): Payer: Self-pay | Admitting: Cardiovascular Disease

## 2021-08-12 ENCOUNTER — Ambulatory Visit (HOSPITAL_COMMUNITY)
Admission: RE | Admit: 2021-08-12 | Discharge: 2021-08-12 | Disposition: A | Discharge status: Home / Self Care | Payer: Medicare Other | Source: Ambulatory Visit | Attending: Cardiovascular Disease | Admitting: Cardiovascular Disease

## 2021-08-12 ENCOUNTER — Encounter (HOSPITAL_COMMUNITY)
Admission: RE | Disposition: A | Discharge status: Home / Self Care | Payer: Self-pay | Source: Ambulatory Visit | Attending: Cardiovascular Disease

## 2021-08-12 DIAGNOSIS — Z7985 Long-term (current) use of injectable non-insulin antidiabetic drugs: Secondary | ICD-10-CM | POA: Diagnosis not present

## 2021-08-12 DIAGNOSIS — Z7982 Long term (current) use of aspirin: Secondary | ICD-10-CM | POA: Diagnosis not present

## 2021-08-12 DIAGNOSIS — Z79899 Other long term (current) drug therapy: Secondary | ICD-10-CM | POA: Insufficient documentation

## 2021-08-12 DIAGNOSIS — Z7952 Long term (current) use of systemic steroids: Secondary | ICD-10-CM | POA: Diagnosis not present

## 2021-08-12 DIAGNOSIS — Z79890 Hormone replacement therapy (postmenopausal): Secondary | ICD-10-CM | POA: Diagnosis not present

## 2021-08-12 DIAGNOSIS — E785 Hyperlipidemia, unspecified: Secondary | ICD-10-CM | POA: Insufficient documentation

## 2021-08-12 DIAGNOSIS — I5032 Chronic diastolic (congestive) heart failure: Secondary | ICD-10-CM | POA: Diagnosis not present

## 2021-08-12 DIAGNOSIS — Z8249 Family history of ischemic heart disease and other diseases of the circulatory system: Secondary | ICD-10-CM | POA: Insufficient documentation

## 2021-08-12 DIAGNOSIS — I110 Hypertensive heart disease with heart failure: Secondary | ICD-10-CM | POA: Diagnosis not present

## 2021-08-12 DIAGNOSIS — I272 Pulmonary hypertension, unspecified: Secondary | ICD-10-CM

## 2021-08-12 HISTORY — PX: RIGHT HEART CATH: CATH118263

## 2021-08-12 LAB — POCT I-STAT EG7
Acid-Base Excess: 8 mmol/L — ABNORMAL HIGH (ref 0.0–2.0)
Acid-Base Excess: 8 mmol/L — ABNORMAL HIGH (ref 0.0–2.0)
Bicarbonate: 34.2 mmol/L — ABNORMAL HIGH (ref 20.0–28.0)
Bicarbonate: 34.2 mmol/L — ABNORMAL HIGH (ref 20.0–28.0)
Calcium, Ion: 1.04 mmol/L — ABNORMAL LOW (ref 1.15–1.40)
Calcium, Ion: 1.06 mmol/L — ABNORMAL LOW (ref 1.15–1.40)
HCT: 37 % (ref 36.0–46.0)
HCT: 37 % (ref 36.0–46.0)
Hemoglobin: 12.6 g/dL (ref 12.0–15.0)
Hemoglobin: 12.6 g/dL (ref 12.0–15.0)
O2 Saturation: 57 %
O2 Saturation: 59 %
Potassium: 3.2 mmol/L — ABNORMAL LOW (ref 3.5–5.1)
Potassium: 3.2 mmol/L — ABNORMAL LOW (ref 3.5–5.1)
Sodium: 145 mmol/L (ref 135–145)
Sodium: 146 mmol/L — ABNORMAL HIGH (ref 135–145)
TCO2: 36 mmol/L — ABNORMAL HIGH (ref 22–32)
TCO2: 36 mmol/L — ABNORMAL HIGH (ref 22–32)
pCO2, Ven: 54.4 mmHg (ref 44–60)
pCO2, Ven: 55 mmHg (ref 44–60)
pH, Ven: 7.402 (ref 7.25–7.43)
pH, Ven: 7.407 (ref 7.25–7.43)
pO2, Ven: 31 mmHg — CL (ref 32–45)
pO2, Ven: 31 mmHg — CL (ref 32–45)

## 2021-08-12 LAB — GLUCOSE, CAPILLARY: Glucose-Capillary: 106 mg/dL — ABNORMAL HIGH (ref 70–99)

## 2021-08-12 SURGERY — RIGHT HEART CATH

## 2021-08-12 MED ORDER — SODIUM CHLORIDE 0.9% FLUSH: 3.0000 mL | INTRAVENOUS | Status: DC | PRN

## 2021-08-12 MED ORDER — SODIUM CHLORIDE 0.9 % IV SOLN: 250.0000 mL | INTRAVENOUS | Status: DC | PRN

## 2021-08-12 MED ORDER — SODIUM CHLORIDE 0.9% FLUSH: 3.0000 mL | Freq: Two times a day (BID) | INTRAVENOUS | Status: DC

## 2021-08-12 MED ORDER — HEPARIN (PORCINE) IN NACL 1000-0.9 UT/500ML-% IV SOLN
INTRAVENOUS | Status: AC
  Filled 2021-08-12: qty 500

## 2021-08-12 MED ORDER — SODIUM CHLORIDE 0.9 % IV SOLN
INTRAVENOUS | Status: DC
Start: 2021-08-12 — End: 2021-08-12

## 2021-08-12 MED ORDER — SODIUM CHLORIDE 0.9 % WEIGHT BASED INFUSION: 3.0000 mL/kg/h | INTRAVENOUS | Status: DC

## 2021-08-12 MED ORDER — SODIUM CHLORIDE 0.9 % WEIGHT BASED INFUSION: 1.0000 mL/kg/h | INTRAVENOUS | Status: DC

## 2021-08-12 MED ORDER — HEPARIN (PORCINE) IN NACL 1000-0.9 UT/500ML-% IV SOLN
INTRAVENOUS | Status: DC | PRN
  Administered 2021-08-12 (×2): 500 mL

## 2021-08-12 MED ORDER — LIDOCAINE HCL (PF) 1 % IJ SOLN
INTRAMUSCULAR | Status: DC | PRN
  Administered 2021-08-12: 2 mL

## 2021-08-12 MED ORDER — SODIUM CHLORIDE 0.9 % IV SOLN: INTRAVENOUS | Status: DC

## 2021-08-12 MED ORDER — ACETAMINOPHEN 325 MG PO TABS: 650.0000 mg | ORAL_TABLET | ORAL | Status: DC | PRN

## 2021-08-12 MED ORDER — ONDANSETRON HCL 4 MG/2ML IJ SOLN: 4.0000 mg | Freq: Four times a day (QID) | INTRAMUSCULAR | Status: DC | PRN

## 2021-08-12 MED ORDER — LIDOCAINE HCL (PF) 1 % IJ SOLN
INTRAMUSCULAR | Status: AC
  Filled 2021-08-12: qty 30

## 2021-08-12 MED ORDER — HYDRALAZINE HCL 20 MG/ML IJ SOLN: 10.0000 mg | INTRAMUSCULAR | Status: DC | PRN

## 2021-08-12 MED ORDER — LABETALOL HCL 5 MG/ML IV SOLN: 10.0000 mg | INTRAVENOUS | Status: DC | PRN

## 2021-08-12 SURGICAL SUPPLY — 6 items
CATH BALLN WEDGE 5F 110CM (CATHETERS) ×1 IMPLANT
GLIDESHEATH SLEND A-KIT 6F 22G (SHEATH) IMPLANT
KIT HEART LEFT (KITS) ×1 IMPLANT
PACK CARDIAC CATHETERIZATION (CUSTOM PROCEDURE TRAY) ×1 IMPLANT
SHEATH GLIDE SLENDER 4/5FR (SHEATH) ×1 IMPLANT
TRANSDUCER W/STOPCOCK (MISCELLANEOUS) ×1 IMPLANT

## 2021-08-12 NOTE — Interval H&P Note (Signed)
History and Physical Interval Note: ? ?08/12/2021 ?7:31 AM ? ?[name]  has presented today for surgery, with the diagnosis of pulmonary htn.  The various methods of treatment have been discussed with the patient and family. After consideration of risks, benefits and other options for treatment, the patient has consented to  Procedure(s): ?RIGHT HEART CATH (N/A) as a surgical intervention.  The patient's history has been reviewed, patient examined, no change in status, stable for surgery.  I have reviewed the patient's chart and labs.  Questions were answered to the patient's satisfaction.   ? ? ?Quay Burow ? ? ?

## 2021-08-15 NOTE — Therapy (Signed)
?OUTPATIENT PHYSICAL THERAPY EVALUATION ? ? ?Patient Name: [name] ?MRN: [id number] ?DOB:1940-09-13, 81 y.o., female ?Today's Date: 08/16/2021 ? ? PT End of Session - 08/16/21 1115   ? ? Visit Number 1   ? Number of Visits 20   ? Date for PT Re-Evaluation 10/25/21   ? Authorization Type Medicare and [place] [facility]   ? Progress Note Due on Visit 10   ? PT Start Time 1107   ? PT Stop Time 9563   ? PT Time Calculation (min) 36 min   ? Activity Tolerance Patient limited by pain   ? Behavior During Therapy [facility] for tasks assessed/performed   ? ?  ?  ? ?  ? ? ?Past Medical History:  ?Diagnosis Date  ? Acute on chronic diastolic (congestive) heart failure ([place])   ? Anemia   ? iron deficiency anemia - secondary to blood loss ( chronic)   ? Anxiety   ? Arthritis   ? endstage changes bilateral knees/bilateral ankles.   ? Asthma   ? Carotid artery occlusion   ? Chronic fatigue   ? Chronic kidney disease   ? Closed left hip fracture ([place])   ? Clotting disorder ([place])   ? pt denies this  ? Contusion of left knee   ? due to fall 1/14.  ? COPD (chronic obstructive pulmonary disease) ([place])   ? pulmonary fibrosis  ? Depression, reactive   ? Diabetes mellitus   ? type II   ? Diastolic dysfunction   ? Difficulty in walking   ? Family history of heart disease   ? Generalized muscle weakness   ? Gout   ? High cholesterol   ? History of falling   ? Hypertension   ? Hypothyroidism   ? Interstitial lung disease ([place])   ? Meningioma of left sphenoid wing involving cavernous sinus ([place]) 02/17/2012  ? Continue diplopia, left eye pain and left headaches.    ? Morbid obesity ([place])   ? Neuromuscular disorder ([place])   ? diabetic neuropathy   ? Normal coronary arteries   ? cardiac catheterization performed  10/31/14  ? Pneumonia   ? RA (rheumatoid arthritis) ([place])   ? has been off methotreaxte since 10/13.  ? Spinal stenosis of lumbar region   ? Thyroid disease   ? Unspecified lack of coordination   ? URI (upper respiratory infection)   ? ?Past  Surgical History:  ?Procedure Laterality Date  ? ABDOMINAL HYSTERECTOMY    ? BRAIN SURGERY    ? Gamma knife 10/13. Needs repeat spring  '14  ? CARDIAC CATHETERIZATION N/A 10/31/2014  ? Procedure: Right/Left Heart Cath and Coronary Angiography;  Surgeon: [name], MD;  Location: [place] [facility];  Service: Cardiovascular;  Laterality: N/A;  ? CYST EXCISION    ? 2 on face  ? CYST REMOVAL NECK    ? ESOPHAGOGASTRODUODENOSCOPY (EGD) WITH PROPOFOL N/A 09/14/2014  ? Procedure: ESOPHAGOGASTRODUODENOSCOPY (EGD) WITH PROPOFOL;  Surgeon: [name], MD;  Location: [facility];  Service: Endoscopy;  Laterality: N/A;  ? INCISION AND DRAINAGE HIP Left 01/16/2017  ? Procedure: IRRIGATION AND DEBRIDEMENT LEFT HIP;  Surgeon: [name], MD;  Location: [facility];  Service: Orthopedics;  Laterality: Left;  ? INTRAMEDULLARY (IM) NAIL INTERTROCHANTERIC Left 11/29/2016  ? Procedure: INTRAMEDULLARY (IM) NAIL INTERTROCHANTRIC;  Surgeon: [name], MD;  Location: [place];  Service: Orthopedics;  Laterality: Left;  ? OVARY SURGERY    ? RIGHT HEART CATH N/A 08/12/2021  ?  Procedure: RIGHT HEART CATH;  Surgeon: Lorretta Harp, MD;  Location: [place] CV [facility];  Service: Cardiovascular;  Laterality: N/A;  ? SHOULDER SURGERY Left   ? TONSILLECTOMY  age 51  ? VIDEO BRONCHOSCOPY Bilateral 05/31/2013  ? Procedure: VIDEO BRONCHOSCOPY WITHOUT FLUORO;  Surgeon: Brand Males, MD;  Location: [place];  Service: Cardiopulmonary;  Laterality: Bilateral;  ? video bronscoscopy  05/19/1998  ? lung  ? ?Patient Active Problem List  ? Diagnosis Date Noted  ? Pulmonary HTN ([place])   ? Unspecified protein-calorie malnutrition ([place]) 04/05/2021  ? Pressure injury of skin 03/31/2021  ? UTI (urinary tract infection) 03/31/2021  ? Sepsis due to urinary tract infection ([place]) 03/30/2021  ? Acute encephalopathy 03/30/2021  ? Thrombocytopenia ([place]) 03/30/2021  ? Diabetic neuropathy ([place]) 05/24/2019  ? Hypokalemia 04/21/2019   ? Anticoagulated 06/25/2018  ? Chronic pain 06/25/2018  ? CRI (chronic renal insufficiency), stage 3 (moderate) ([place]) 06/25/2018  ? Drug-induced constipation 06/18/2018  ? DVT (deep venous thrombosis) ([place]) 06/03/2018  ? Primary osteoarthritis of both knees 05/26/2018  ? Body mass index (BMI) of 50-59.9 in adult [facility]) 05/26/2018  ? Acquired hypothyroidism 05/26/2018  ? Chronic bilateral low back pain without sciatica 02/17/2018  ? Astigmatism with presbyopia, bilateral 03/26/2017  ? Cortical age-related cataract of both eyes 03/26/2017  ? Family history of glaucoma 03/26/2017  ? Long term current use of oral hypoglycemic drug 03/26/2017  ? Nuclear sclerotic cataract of both eyes 03/26/2017  ? Pain in left hip 02/18/2017  ? Closed nondisplaced intertrochanteric fracture of left femur with delayed healing 01/14/2017  ? Gout 11/29/2016  ? Depression 11/29/2016  ? Closed left hip fracture ([place]) 11/29/2016  ? GERD (gastroesophageal reflux disease) 11/29/2016  ? Closed intertrochanteric fracture of hip, left, initial encounter ([place]) 11/29/2016  ? Physical deconditioning 07/31/2015  ? Pulmonary fibrosis, postinflammatory ([place]) 07/31/2015  ? Bronchiectasis without complication ([place]) 69/62/9528  ? Tendinitis of left rotator cuff 06/13/2015  ? Chronic diastolic CHF (congestive heart failure) ([place]) 04/21/2015  ? Acute kidney injury superimposed on chronic kidney disease ([place]) 04/17/2015  ? Chronic respiratory failure with hypoxia ([place]) 02/09/2015  ? Dyspnea and respiratory abnormality 01/03/2015  ? Normal coronary arteries 11/02/2014  ? Morbid obesity ([place]) 10/30/2014  ? Dysphagia 08/21/2014  ? Chronic fatigue 06/21/2014  ? DOE (dyspnea on exertion) 05/24/2014  ? Primary osteoarthritis of left knee 04/26/2014  ? High risk medication use 04/17/2014  ? Aphasia 02/12/2014  ? 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 Lumbar facet arthropathy 01/18/2014  ? Bilateral edema of lower extremity 05/30/2013  ?  Chronic cough 05/24/2013  ? ILD (interstitial lung disease) ([place]) 04/10/2013  ? Spinal stenosis of lumbar region with radiculopathy 07/12/2012  ? Inability to walk 07/12/2012  ? Meningioma of left sphenoid wing involving cavernous sinus ([place]) 02/17/2012  ? Chest pain of uncertain etiology 41/32/4401  ? Rheumatoid arthritis ([place]) 01/28/2012  ? Primary osteoarthritis of right knee 01/28/2012  ? Dyslipidemia   ? Thyroid disease   ? Essential hypertension   ? ? ?PCP: [name], MD ? ?REFERRING PROVIDER: [name], MD ? ?REFERRING DIAG: M54.50,G89.29 (ICD-10-CM) - Chronic bilateral low back pain without sciatica ?M25.552 (ICD-10-CM) - Pain in left hip ? ?THERAPY DIAG:  ?Pain in left hip ? ?Other low back pain ? ?Muscle weakness (generalized) ? ?Difficulty in walking, not elsewhere classified ? ?Abnormal posture ? ?ONSET DATE:  Dec 2022 ? ?SUBJECTIVE:                                                                                                                                                                                          ? ?  SUBJECTIVE STATEMENT: ?Pt indicated having a lot of pain in back and into Lt leg.  Pt indicated history of injections in back "maybe last year" that helped.  Pt indicated she was sick in hospital and then to rehab and got COVID and since has had more trouble with breathing, oxygen and pain to return.   Pt indicated still using oxygen at night and on exertion. History of knee complaints as well.  Sleeps about 5-6 hours without waking.  ? ?Injection planned April 20th, 2023 with Dr. Ernestina Patches ? ?PERTINENT HISTORY:  ?HTN, hypothyroidism, history of RA, COPD, DM, high cholesterol, CHF.  ORIF Lt femur from hip fracture "about 4-5 years ago" ? ?PAIN:  ?Are you having pain? Yes: NPRS scale: currently 5/10, at worst 7-8/10 ?Pain location: back pain, Lt leg pain to knee ?Pain description: burning, sharp, shooting ?Aggravating factors: worse in morning, bending, walking prolonged, standing  prolonged, more activity.  ?Relieving factors: medicine ? ? ?PRECAUTIONS: None ? ?WEIGHT BEARING RESTRICTIONS No ? ?FALLS:  ?Has patient fallen in last 6 months? No ? ?LIVING ENVIRONMENT: ?Lives with:

## 2021-08-16 ENCOUNTER — Ambulatory Visit (INDEPENDENT_AMBULATORY_CARE_PROVIDER_SITE_OTHER): Payer: Medicare Other | Admitting: Rehabilitative and Restorative Service Providers"

## 2021-08-16 ENCOUNTER — Other Ambulatory Visit: Payer: Self-pay

## 2021-08-16 ENCOUNTER — Encounter: Payer: Self-pay | Admitting: Rehabilitative and Restorative Service Providers"

## 2021-08-16 DIAGNOSIS — R293 Abnormal posture: Secondary | ICD-10-CM | POA: Diagnosis not present

## 2021-08-16 DIAGNOSIS — M5459 Other low back pain: Secondary | ICD-10-CM | POA: Diagnosis not present

## 2021-08-16 DIAGNOSIS — M25552 Pain in left hip: Secondary | ICD-10-CM | POA: Diagnosis not present

## 2021-08-16 DIAGNOSIS — M6281 Muscle weakness (generalized): Secondary | ICD-10-CM | POA: Diagnosis not present

## 2021-08-16 DIAGNOSIS — R262 Difficulty in walking, not elsewhere classified: Secondary | ICD-10-CM

## 2021-08-19 ENCOUNTER — Ambulatory Visit (INDEPENDENT_AMBULATORY_CARE_PROVIDER_SITE_OTHER): Payer: Medicare Other | Admitting: Family Medicine

## 2021-08-19 VITALS — BP 143/76 | HR 97 | Temp 97.6°F

## 2021-08-19 DIAGNOSIS — M05742 Rheumatoid arthritis with rheumatoid factor of left hand without organ or systems involvement: Secondary | ICD-10-CM

## 2021-08-19 DIAGNOSIS — M545 Low back pain, unspecified: Secondary | ICD-10-CM | POA: Diagnosis not present

## 2021-08-19 DIAGNOSIS — G8929 Other chronic pain: Secondary | ICD-10-CM | POA: Diagnosis not present

## 2021-08-19 DIAGNOSIS — I5032 Chronic diastolic (congestive) heart failure: Secondary | ICD-10-CM

## 2021-08-19 DIAGNOSIS — R5381 Other malaise: Secondary | ICD-10-CM | POA: Diagnosis not present

## 2021-08-19 DIAGNOSIS — J849 Interstitial pulmonary disease, unspecified: Secondary | ICD-10-CM

## 2021-08-19 DIAGNOSIS — M05741 Rheumatoid arthritis with rheumatoid factor of right hand without organ or systems involvement: Secondary | ICD-10-CM | POA: Diagnosis not present

## 2021-08-19 NOTE — Progress Notes (Signed)
Subjective:  ? ? Patient ID: [name], female    DOB: 07/10/1940, 81 y.o.   MRN: [id number] ? ?Chief Complaint  ?Patient presents with  ? Follow-up  ?  Still having a lot of pain, therapy started on Friday.  Was sore afterwards, has to walk in, the day after when she wakes up she is sore and in more pain. Went to rheumatologist for rt hand, is getting.  ?Pt accompanied by her daughter, [name]. ? ?HPI ?Patient was seen today for f/u.  Seen by rheumatology.  Pt states she was told there was nothing else that could be done to help her R hand.  Pt with several fingers of R hand partially flexed 2/2 RA after prednisone was inadvertently stopped while in rehab December 2022. ? ?Patient did not realize she was going back to outpatient PT.  Uses walker when going into physical therapy building.  Endorses being sore after PT as they really work her.  Patient also notes increased low back pain after laying flat on PT table.  Patient would like to return to water aerobics. ? ?States breathing is doing good.  May have to use 3 L oxygen in the morning when moving around more or when has appointments.  Having some LE edema as doing more sitting.  Typically does not take Lasix 80 mg on days she has appointments.  Patient had right heart cath 327/23.  Doing well status post procedure. ? ?Patient states she was having some difficulty receiving medication refills from [place].  States the pharmacy will not request a new prescription for chronic medications even if given a year supply ? ?Past Medical History:  ?Diagnosis Date  ? Acute on chronic diastolic (congestive) heart failure ([place])   ? Anemia   ? iron deficiency anemia - secondary to blood loss ( chronic)   ? Anxiety   ? Arthritis   ? endstage changes bilateral knees/bilateral ankles.   ? Asthma   ? Carotid artery occlusion   ? Chronic fatigue   ? Chronic kidney disease   ? Closed left hip fracture ([place])   ? Clotting disorder ([place])   ? pt denies this  ? Contusion of left  knee   ? due to fall 1/14.  ? COPD (chronic obstructive pulmonary disease) ([place])   ? pulmonary fibrosis  ? Depression, reactive   ? Diabetes mellitus   ? type II   ? Diastolic dysfunction   ? Difficulty in walking   ? Family history of heart disease   ? Generalized muscle weakness   ? Gout   ? High cholesterol   ? History of falling   ? Hypertension   ? Hypothyroidism   ? Interstitial lung disease ([place])   ? Meningioma of left sphenoid wing involving cavernous sinus ([place]) 02/17/2012  ? Continue diplopia, left eye pain and left headaches.    ? Morbid obesity ([place])   ? Neuromuscular disorder ([place])   ? diabetic neuropathy   ? Normal coronary arteries   ? cardiac catheterization performed  10/31/14  ? Pneumonia   ? RA (rheumatoid arthritis) ([place])   ? has been off methotreaxte since 10/13.  ? Spinal stenosis of lumbar region   ? Thyroid disease   ? Unspecified lack of coordination   ? URI (upper respiratory infection)   ? ? ?Allergies  ?Allergen Reactions  ? Codeine Swelling and Other (See Comments)  ?  Facial swelling ?Chest pain and swelling in legs ? ?  ? Infliximab  Anaphylaxis  ?  ([place]) "sent me into shock" ?  ? Lisinopril Swelling  ?  Face and neck swelling ?  ? ? ?ROS ?General: Denies fever, chills, night sweats, changes in weight, changes in appetite ?HEENT: Denies headaches, ear pain, changes in vision, rhinorrhea, sore throat ?CV: Denies CP, palpitations, SOB, orthopnea  + bilateral LE edema ?Pulm: Denies SOB, cough, wheezing ?GI: Denies abdominal pain, nausea, vomiting, diarrhea, constipation ?GU: Denies dysuria, hematuria, frequency, vaginal discharge ?Msk: Denies muscle cramps  +joint pains, chronic low back pain ?Neuro: Denies weakness, numbness, tingling ?Skin: Denies rashes, bruising ?Psych: Denies depression, anxiety, hallucinations ?   ?Objective:  ?  ?Blood pressure (!) 143/76, pulse 97, temperature 97.6 ?F (36.4 ?C), temperature source Oral, SpO2 97 %. ? ?Gen. Pleasant, well-nourished, in no  distress, normal affect.  On 3 L O2 via portable O2 compressor ?HEENT: [place]/AT, face symmetric, conjunctiva clear, no scleral icterus, PERRLA, EOMI, nares patent without drainage, pharynx without erythema or exudate. ?Lungs: no accessory muscle use, CTAB, no wheezes or rales ?Cardiovascular: RRR, no m/r/g, 1+ pitting edema left greater than right LE ?Musculoskeletal: Digits of right hand partially flexed. No deformities, no cyanosis or clubbing, normal tone ?Neuro:  A&Ox3, CN II-XII intact, gait not assessed as sitting in transport wheelchair ?Skin:  Warm, dry, intact.  Hyperpigmentation on bilateral LEs 2/2 chronic edema. ? ? ?Wt Readings from Last 3 Encounters:  ?08/12/21 223 lb (101.2 kg)  ?08/02/21 225 lb (102.1 kg)  ?07/22/21 224 lb (101.6 kg)  ? ? ?Lab Results  ?Component Value Date  ? WBC 8.4 05/22/2021  ? HGB 12.6 08/12/2021  ? HGB 12.6 08/12/2021  ? HCT 37.0 08/12/2021  ? HCT 37.0 08/12/2021  ? PLT 195.0 05/22/2021  ? GLUCOSE 94 05/22/2021  ? CHOL 136 09/26/2020  ? TRIG 283.0 (H) 09/26/2020  ? HDL 40.00 09/26/2020  ? LDLDIRECT 59.0 09/26/2020  ? [place] 75 09/22/2019  ? ALT 18 05/22/2021  ? AST 16 05/22/2021  ? NA 146 (H) 08/12/2021  ? NA 145 08/12/2021  ? K 3.2 (L) 08/12/2021  ? K 3.2 (L) 08/12/2021  ? CL 103 05/22/2021  ? CREATININE 0.82 05/22/2021  ? BUN 15 05/22/2021  ? CO2 32 05/22/2021  ? TSH 4.598 (H) 01/16/2021  ? INR 1.0 03/30/2021  ? HGBA1C 6.5 (H) 04/02/2021  ? ? ?Assessment/Plan: ? ?Rheumatoid arthritis involving both hands with positive rheumatoid factor ([place]) ?-discussed inability to extended fingers of R hand after prednisone inadvertently stopped during stay in rehab December 2022. ?-continue treatment of symptoms with heat, stretching, topical analgesics, etc. ?-plaquenil 200 mg daily, arava 20 mg daily, prednisone 10 mg daily ?-Continue follow-up with [facility], Dr. Toy Care ? ?ILD (interstitial lung disease) ([place]) ?-Stable ?-Continue 3 L O2 via [place] ?-Continue current  medications ?-Continue follow-up with pulmonology locally and at [facility] ? ?Physical deconditioning ?-Continue physical therapy ?-Patient encouraged to move more at home with supervision. ? ?Chronic diastolic CHF (congestive heart failure) ([place]) ?-s/p R heart cath 08/12/21 ?-Mild edema on exam in bilateral LEs ?-Patient encouraged to take Lasix 80 mg upon returning home this am ?-Continue other medications including metoprolol ?-Continue follow-up with Cardiology ? ?Chronic bilateral low back pain without sciatica ?-Continue follow-up with pain management ?-Patient encouraged not to quit physical therapy. ?-Advised if regains strength may be able to return to water aerobics. ?-continue current meds including  MS contin 15 mg BID, Morphine IR 15 mg BID prn for breakthrough pain, ? ?F/u in 2-3 months ? ?[place]  Volanda Napoleon, MD ?

## 2021-08-21 ENCOUNTER — Encounter: Payer: Self-pay | Admitting: Family Medicine

## 2021-08-27 ENCOUNTER — Encounter: Payer: Medicare Other | Admitting: Rehabilitative and Restorative Service Providers"

## 2021-08-27 ENCOUNTER — Encounter: Payer: Medicare Other | Admitting: Physician Assistant

## 2021-08-28 NOTE — Progress Notes (Signed)
This encounter was created in error - please disregard. ?

## 2021-09-02 ENCOUNTER — Ambulatory Visit (INDEPENDENT_AMBULATORY_CARE_PROVIDER_SITE_OTHER): Payer: Medicare Other | Admitting: Rehabilitative and Restorative Service Providers"

## 2021-09-02 ENCOUNTER — Encounter: Payer: Self-pay | Admitting: Physician Assistant

## 2021-09-02 ENCOUNTER — Other Ambulatory Visit: Payer: Self-pay

## 2021-09-02 ENCOUNTER — Encounter: Payer: Self-pay | Admitting: Rehabilitative and Restorative Service Providers"

## 2021-09-02 DIAGNOSIS — M25552 Pain in left hip: Secondary | ICD-10-CM | POA: Diagnosis not present

## 2021-09-02 DIAGNOSIS — M6281 Muscle weakness (generalized): Secondary | ICD-10-CM | POA: Diagnosis not present

## 2021-09-02 DIAGNOSIS — R293 Abnormal posture: Secondary | ICD-10-CM | POA: Diagnosis not present

## 2021-09-02 DIAGNOSIS — R262 Difficulty in walking, not elsewhere classified: Secondary | ICD-10-CM | POA: Diagnosis not present

## 2021-09-02 DIAGNOSIS — M5459 Other low back pain: Secondary | ICD-10-CM | POA: Diagnosis not present

## 2021-09-02 NOTE — Therapy (Signed)
?OUTPATIENT PHYSICAL THERAPY TREATMENT NOTE ? ? ?Patient Name: [name] ?MRN: [id number] ?DOB:1940-12-27, 81 y.o., female ?Today's Date: 09/02/2021 ? ?PCP: [name], MD ?REFERRING PROVIDER: [name], MD ? ?END OF SESSION:  ? PT End of Session - 09/02/21 1302   ? ? Visit Number 2   ? Number of Visits 20   ? Date for PT Re-Evaluation 10/25/21   ? Authorization Type Medicare and [place] [facility]   ? Progress Note Due on Visit 10   ? PT Start Time 1301   ? PT Stop Time 4627   ? PT Time Calculation (min) 40 min   ? Activity Tolerance Patient limited by pain   ? Behavior During Therapy [facility] for tasks assessed/performed   ? ?  ?  ? ?  ? ? ?Past Medical History:  ?Diagnosis Date  ? Acute on chronic diastolic (congestive) heart failure ([place])   ? Anemia   ? iron deficiency anemia - secondary to blood loss ( chronic)   ? Anxiety   ? Arthritis   ? endstage changes bilateral knees/bilateral ankles.   ? Asthma   ? Carotid artery occlusion   ? Chronic fatigue   ? Chronic kidney disease   ? Closed left hip fracture ([place])   ? Clotting disorder ([place])   ? pt denies this  ? Contusion of left knee   ? due to fall 1/14.  ? COPD (chronic obstructive pulmonary disease) ([place])   ? pulmonary fibrosis  ? Depression, reactive   ? Diabetes mellitus   ? type II   ? Diastolic dysfunction   ? Difficulty in walking   ? Family history of heart disease   ? Generalized muscle weakness   ? Gout   ? High cholesterol   ? History of falling   ? Hypertension   ? Hypothyroidism   ? Interstitial lung disease ([place])   ? Meningioma of left sphenoid wing involving cavernous sinus ([place]) 02/17/2012  ? Continue diplopia, left eye pain and left headaches.    ? Morbid obesity ([place])   ? Neuromuscular disorder ([place])   ? diabetic neuropathy   ? Normal coronary arteries   ? cardiac catheterization performed  10/31/14  ? Pneumonia   ? RA (rheumatoid arthritis) ([place])   ? has been off methotreaxte since 10/13.  ? Spinal stenosis of lumbar region   ? Thyroid  disease   ? Unspecified lack of coordination   ? URI (upper respiratory infection)   ? ?Past Surgical History:  ?Procedure Laterality Date  ? ABDOMINAL HYSTERECTOMY    ? BRAIN SURGERY    ? Gamma knife 10/13. Needs repeat spring  '14  ? CARDIAC CATHETERIZATION N/A 10/31/2014  ? Procedure: Right/Left Heart Cath and Coronary Angiography;  Surgeon: [name], MD;  Location: [place] [facility];  Service: Cardiovascular;  Laterality: N/A;  ? CYST EXCISION    ? 2 on face  ? CYST REMOVAL NECK    ? ESOPHAGOGASTRODUODENOSCOPY (EGD) WITH PROPOFOL N/A 09/14/2014  ? Procedure: ESOPHAGOGASTRODUODENOSCOPY (EGD) WITH PROPOFOL;  Surgeon: [name], MD;  [facility];  Service: Endoscopy;  Laterality: N/A;  ? INCISION AND DRAINAGE HIP Left 01/16/2017  ? Procedure: IRRIGATION AND DEBRIDEMENT LEFT HIP;  Surgeon: [name], MD;  [facility];  Service: Orthopedics;  Laterality: Left;  ? INTRAMEDULLARY (IM) NAIL INTERTROCHANTERIC Left 11/29/2016  ? Procedure: INTRAMEDULLARY (IM) NAIL INTERTROCHANTRIC;  Surgeon: [name], MD;  Location: [place];  Service: Orthopedics;  Laterality: Left;  ? OVARY SURGERY    ? RIGHT HEART CATH N/A 08/12/2021  ? Procedure: RIGHT HEART CATH;  Surgeon: Lorretta Harp, MD;  Location: [place] [facility];  Service: Cardiovascular;  Laterality: N/A;  ? SHOULDER SURGERY Left   ? TONSILLECTOMY  age 81  ? VIDEO BRONCHOSCOPY Bilateral 05/31/2013  ? Procedure: VIDEO BRONCHOSCOPY WITHOUT FLUORO;  Surgeon: Brand Males, MD;  Location: [place];  Service: Cardiopulmonary;  Laterality: Bilateral;  ? video bronscoscopy  05/19/1998  ? lung  ? ?Patient Active Problem List  ? Diagnosis Date Noted  ? Pulmonary HTN ([place])   ? Unspecified protein-calorie malnutrition ([place]) 04/05/2021  ? Pressure injury of skin 03/31/2021  ? UTI (urinary tract infection) 03/31/2021  ? Sepsis due to urinary tract infection ([place]) 03/30/2021  ? Acute encephalopathy 03/30/2021  ?  Thrombocytopenia ([place]) 03/30/2021  ? Diabetic neuropathy ([place]) 05/24/2019  ? Hypokalemia 04/21/2019  ? Anticoagulated 06/25/2018  ? Chronic pain 06/25/2018  ? CRI (chronic renal insufficiency), stage 3 (moderate) ([place]) 06/25/2018  ? Drug-induced constipation 06/18/2018  ? DVT (deep venous thrombosis) ([place]) 06/03/2018  ? Primary osteoarthritis of both knees 05/26/2018  ? Body mass index (BMI) of 50-59.9 in adult [facility]) 05/26/2018  ? Acquired hypothyroidism 05/26/2018  ? Chronic bilateral low back pain without sciatica 02/17/2018  ? Astigmatism with presbyopia, bilateral 03/26/2017  ? Cortical age-related cataract of both eyes 03/26/2017  ? Family history of glaucoma 03/26/2017  ? Long term current use of oral hypoglycemic drug 03/26/2017  ? Nuclear sclerotic cataract of both eyes 03/26/2017  ? Pain in left hip 02/18/2017  ? Closed nondisplaced intertrochanteric fracture of left femur with delayed healing 01/14/2017  ? Gout 11/29/2016  ? Depression 11/29/2016  ? Closed left hip fracture ([place]) 11/29/2016  ? GERD (gastroesophageal reflux disease) 11/29/2016  ? Closed intertrochanteric fracture of hip, left, initial encounter ([place]) 11/29/2016  ? Physical deconditioning 07/31/2015  ? Pulmonary fibrosis, postinflammatory ([place]) 07/31/2015  ? Bronchiectasis without complication ([place]) 26/94/8546  ? Tendinitis of left rotator cuff 06/13/2015  ? Chronic diastolic CHF (congestive heart failure) ([place]) 04/21/2015  ? Acute kidney injury superimposed on chronic kidney disease ([place]) 04/17/2015  ? Chronic respiratory failure with hypoxia ([place]) 02/09/2015  ? Dyspnea and respiratory abnormality 01/03/2015  ? Normal coronary arteries 11/02/2014  ? Morbid obesity ([place]) 10/30/2014  ? Dysphagia 08/21/2014  ? Chronic fatigue 06/21/2014  ? DOE (dyspnea on exertion) 05/24/2014  ? Primary osteoarthritis of left knee 04/26/2014  ? High risk medication use 04/17/2014  ? Aphasia 02/12/2014  ? 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 Lumbar facet arthropathy 01/18/2014  ? Bilateral edema of lower extremity 05/30/2013  ? Chronic cough 05/24/2013  ? ILD (interstitial lung disease) ([place]) 04/10/2013  ? Spinal stenosis of lumbar region with radiculopathy 07/12/2012  ? Inability to walk 07/12/2012  ? Meningioma of left sphenoid wing involving cavernous sinus ([place]) 02/17/2012  ? Chest pain of uncertain etiology 27/07/5007  ? Rheumatoid arthritis ([place]) 01/28/2012  ? Primary osteoarthritis of right knee 01/28/2012  ? Dyslipidemia   ? Thyroid disease   ? Essential hypertension   ? ? ?REFERRING PROVIDER: [name], MD ?  ?REFERRING DIAG: M54.50,G89.29 (ICD-10-CM) - Chronic bilateral low back pain without sciatica ?M25.552 (ICD-10-CM) - Pain in left hip ? ?ONSET DATE:  Dec 2022 ? ?THERAPY DIAG:  ?Other low back pain ? ?Pain in left hip ? ?Muscle weakness (generalized) ? ?Difficulty in walking, not elsewhere classified ? ?Abnormal  posture ? ?PERTINENT HISTORY: HTN, hypothyroidism, history of RA, COPD, DM, high cholesterol, CHF.  ORIF Lt femur from hip fracture "about 4-5 years ago" ? ?PRECAUTIONS: may require supplemental oxygen ? ?SUBJECTIVE: Pt indicated Lt hip/leg hurting about 5/10 today, maybe some worse the evaluation visit.  Pt stated last week has been worse.  Goes Thursday for injection in back.  ? ?PAIN:  ?Are you having pain? Yes: NPRS scale: currently 5/10 ?Pain location: back pain, Lt leg pain to knee ?Pain description: burning, sharp, shooting ?Aggravating factors: worse in morning, bending, walking prolonged, standing prolonged, more activity.  ?Relieving factors: medicine ? ?OBJECTIVE:  ?  ?PATIENT SURVEYS:  ?08/16/2021 FOTO intake: 33 predicted:  37 ?  ?SCREENING FOR RED FLAGS: ?Bowel or bladder incontinence: No ?Cauda equina syndrome: No ?  ?  ?COGNITION: ?           08/16/2021 Overall cognitive status: Within functional limits for tasks assessed                     ?            ?SENSATION: ?08/16/2021 Light touch: WFL ?   ?MUSCLE LENGTH: ?08/16/2021:  none tested today ?  ?POSTURE:  ?08/16/2021 : rounded shoulders, increased thoracic kyphosis, reduced lumbar lordosis in standing, forward trunk lean noted ?  ?PALPATION: ?08/16/2021 :

## 2021-09-03 ENCOUNTER — Ambulatory Visit: Payer: Medicare Other | Admitting: Internal Medicine

## 2021-09-04 ENCOUNTER — Other Ambulatory Visit: Payer: Self-pay

## 2021-09-04 ENCOUNTER — Encounter: Payer: Self-pay | Admitting: Rehabilitative and Restorative Service Providers"

## 2021-09-04 ENCOUNTER — Ambulatory Visit (INDEPENDENT_AMBULATORY_CARE_PROVIDER_SITE_OTHER): Payer: Medicare Other | Admitting: Rehabilitative and Restorative Service Providers"

## 2021-09-04 DIAGNOSIS — M5459 Other low back pain: Secondary | ICD-10-CM

## 2021-09-04 DIAGNOSIS — R293 Abnormal posture: Secondary | ICD-10-CM | POA: Diagnosis not present

## 2021-09-04 DIAGNOSIS — R262 Difficulty in walking, not elsewhere classified: Secondary | ICD-10-CM | POA: Diagnosis not present

## 2021-09-04 DIAGNOSIS — M6281 Muscle weakness (generalized): Secondary | ICD-10-CM

## 2021-09-04 DIAGNOSIS — M25552 Pain in left hip: Secondary | ICD-10-CM

## 2021-09-04 NOTE — Therapy (Signed)
?OUTPATIENT PHYSICAL THERAPY TREATMENT NOTE ? ? ?Patient Name: [name] ?MRN: [id number] ?DOB:26-Oct-1940, 81 y.o., female ?Today's Date: 09/04/2021 ? ?PCP: [name], MD ?REFERRING PROVIDER: [name], MD ? ?END OF SESSION:  ? PT End of Session - 09/04/21 1259   ? ? Visit Number 3   ? Number of Visits 20   ? Date for PT Re-Evaluation 10/25/21   ? Authorization Type Medicare and [place] [facility]   ? Progress Note Due on Visit 10   ? PT Start Time 1258   ? PT Stop Time 2694   ? PT Time Calculation (min) 39 min   ? Activity Tolerance Patient limited by fatigue   ? Behavior During Therapy [facility] for tasks assessed/performed   ? ?  ?  ? ?  ? ? ? ?Past Medical History:  ?Diagnosis Date  ? Acute on chronic diastolic (congestive) heart failure ([place])   ? Anemia   ? iron deficiency anemia - secondary to blood loss ( chronic)   ? Anxiety   ? Arthritis   ? endstage changes bilateral knees/bilateral ankles.   ? Asthma   ? Carotid artery occlusion   ? Chronic fatigue   ? Chronic kidney disease   ? Closed left hip fracture ([place])   ? Clotting disorder ([place])   ? pt denies this  ? Contusion of left knee   ? due to fall 1/14.  ? COPD (chronic obstructive pulmonary disease) ([place])   ? pulmonary fibrosis  ? Depression, reactive   ? Diabetes mellitus   ? type II   ? Diastolic dysfunction   ? Difficulty in walking   ? Family history of heart disease   ? Generalized muscle weakness   ? Gout   ? High cholesterol   ? History of falling   ? Hypertension   ? Hypothyroidism   ? Interstitial lung disease ([place])   ? Meningioma of left sphenoid wing involving cavernous sinus ([place]) 02/17/2012  ? Continue diplopia, left eye pain and left headaches.    ? Morbid obesity ([place])   ? Neuromuscular disorder ([place])   ? diabetic neuropathy   ? Normal coronary arteries   ? cardiac catheterization performed  10/31/14  ? Pneumonia   ? RA (rheumatoid arthritis) ([place])   ? has been off methotreaxte since 10/13.  ? Spinal stenosis of lumbar region   ? Thyroid  disease   ? Unspecified lack of coordination   ? URI (upper respiratory infection)   ? ?Past Surgical History:  ?Procedure Laterality Date  ? ABDOMINAL HYSTERECTOMY    ? BRAIN SURGERY    ? Gamma knife 10/13. Needs repeat spring  '14  ? CARDIAC CATHETERIZATION N/A 10/31/2014  ? Procedure: Right/Left Heart Cath and Coronary Angiography;  Surgeon: [name], MD;  Location: [place] [facility];  Service: Cardiovascular;  Laterality: N/A;  ? CYST EXCISION    ? 2 on face  ? CYST REMOVAL NECK    ? ESOPHAGOGASTRODUODENOSCOPY (EGD) WITH PROPOFOL N/A 09/14/2014  ? Procedure: ESOPHAGOGASTRODUODENOSCOPY (EGD) WITH PROPOFOL;  Surgeon: [name], MD;  [facility];  Service: Endoscopy;  Laterality: N/A;  ? INCISION AND DRAINAGE HIP Left 01/16/2017  ? Procedure: IRRIGATION AND DEBRIDEMENT LEFT HIP;  Surgeon: [name], MD;  [facility];  Service: Orthopedics;  Laterality: Left;  ? INTRAMEDULLARY (IM) NAIL INTERTROCHANTERIC Left 11/29/2016  ? Procedure: INTRAMEDULLARY (IM) NAIL INTERTROCHANTRIC;  Surgeon: [name], MD;  Location: [place];  Service:  Orthopedics;  Laterality: Left;  ? OVARY SURGERY    ? RIGHT HEART CATH N/A 08/12/2021  ? Procedure: RIGHT HEART CATH;  Surgeon: Lorretta Harp, MD;  Location: [place] [facility];  Service: Cardiovascular;  Laterality: N/A;  ? SHOULDER SURGERY Left   ? TONSILLECTOMY  age 50  ? VIDEO BRONCHOSCOPY Bilateral 05/31/2013  ? Procedure: VIDEO BRONCHOSCOPY WITHOUT FLUORO;  Surgeon: Brand Males, MD;  Location: [place];  Service: Cardiopulmonary;  Laterality: Bilateral;  ? video bronscoscopy  05/19/1998  ? lung  ? ?Patient Active Problem List  ? Diagnosis Date Noted  ? Pulmonary HTN ([place])   ? Unspecified protein-calorie malnutrition ([place]) 04/05/2021  ? Pressure injury of skin 03/31/2021  ? UTI (urinary tract infection) 03/31/2021  ? Sepsis due to urinary tract infection ([place]) 03/30/2021  ? Acute encephalopathy 03/30/2021  ?  Thrombocytopenia ([place]) 03/30/2021  ? Diabetic neuropathy ([place]) 05/24/2019  ? Hypokalemia 04/21/2019  ? Anticoagulated 06/25/2018  ? Chronic pain 06/25/2018  ? CRI (chronic renal insufficiency), stage 3 (moderate) ([place]) 06/25/2018  ? Drug-induced constipation 06/18/2018  ? DVT (deep venous thrombosis) ([place]) 06/03/2018  ? Primary osteoarthritis of both knees 05/26/2018  ? Body mass index (BMI) of 50-59.9 in adult [facility]) 05/26/2018  ? Acquired hypothyroidism 05/26/2018  ? Chronic bilateral low back pain without sciatica 02/17/2018  ? Astigmatism with presbyopia, bilateral 03/26/2017  ? Cortical age-related cataract of both eyes 03/26/2017  ? Family history of glaucoma 03/26/2017  ? Long term current use of oral hypoglycemic drug 03/26/2017  ? Nuclear sclerotic cataract of both eyes 03/26/2017  ? Pain in left hip 02/18/2017  ? Closed nondisplaced intertrochanteric fracture of left femur with delayed healing 01/14/2017  ? Gout 11/29/2016  ? Depression 11/29/2016  ? Closed left hip fracture ([place]) 11/29/2016  ? GERD (gastroesophageal reflux disease) 11/29/2016  ? Closed intertrochanteric fracture of hip, left, initial encounter ([place]) 11/29/2016  ? Physical deconditioning 07/31/2015  ? Pulmonary fibrosis, postinflammatory ([place]) 07/31/2015  ? Bronchiectasis without complication ([place]) 48/18/5631  ? Tendinitis of left rotator cuff 06/13/2015  ? Chronic diastolic CHF (congestive heart failure) ([place]) 04/21/2015  ? Acute kidney injury superimposed on chronic kidney disease ([place]) 04/17/2015  ? Chronic respiratory failure with hypoxia ([place]) 02/09/2015  ? Dyspnea and respiratory abnormality 01/03/2015  ? Normal coronary arteries 11/02/2014  ? Morbid obesity ([place]) 10/30/2014  ? Dysphagia 08/21/2014  ? Chronic fatigue 06/21/2014  ? DOE (dyspnea on exertion) 05/24/2014  ? Primary osteoarthritis of left knee 04/26/2014  ? High risk medication use 04/17/2014  ? Aphasia 02/12/2014  ? 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 Lumbar facet arthropathy 01/18/2014  ? Bilateral edema of lower extremity 05/30/2013  ? Chronic cough 05/24/2013  ? ILD (interstitial lung disease) ([place]) 04/10/2013  ? Spinal stenosis of lumbar region with radiculopathy 07/12/2012  ? Inability to walk 07/12/2012  ? Meningioma of left sphenoid wing involving cavernous sinus ([place]) 02/17/2012  ? Chest pain of uncertain etiology 49/70/2637  ? Rheumatoid arthritis ([place]) 01/28/2012  ? Primary osteoarthritis of right knee 01/28/2012  ? Dyslipidemia   ? Thyroid disease   ? Essential hypertension   ? ? ?REFERRING PROVIDER: [name], MD ?  ?REFERRING DIAG: M54.50,G89.29 (ICD-10-CM) - Chronic bilateral low back pain without sciatica ?M25.552 (ICD-10-CM) - Pain in left hip ? ?ONSET DATE:  Dec 2022 ? ?THERAPY DIAG:  ?Other low back pain ? ?Pain in left hip ? ?Muscle weakness (generalized) ? ?Difficulty in walking, not elsewhere classified ? ?  Abnormal posture ? ?PERTINENT HISTORY: HTN, hypothyroidism, history of RA, COPD, DM, high cholesterol, CHF.  ORIF Lt femur from hip fracture "about 4-5 years ago" ? ?PRECAUTIONS: may require supplemental oxygen ? ?SUBJECTIVE: Pt indicated 4/10 pain in back today with some mild improvement in activity tolerance and walking.  Pt indicated last night was painful but didn't feel increase in pain after her last viist.  ? ?PAIN:  ?Are you having pain? Yes:  ?NPRS scale: currently 4/10 ?Pain location: back pain, Lt leg pain to knee ?Pain description: burning, sharp, shooting ?Aggravating factors: worse in morning, bending, walking prolonged, standing prolonged ?Relieving factors: medicine ? ?OBJECTIVE:  ?  ?PATIENT SURVEYS:  ?08/16/2021 FOTO intake: 33 predicted:  37 ?  ?SCREENING FOR RED FLAGS: ?Bowel or bladder incontinence: No ?Cauda equina syndrome: No ?  ?  ?COGNITION: ?           08/16/2021 Overall cognitive status: Within functional limits for tasks assessed                     ?            ?SENSATION: ?08/16/2021 Light  touch: WFL ?  ?MUSCLE LENGTH: ?08/16/2021:  none tested today ?  ?POSTURE:  ?08/16/2021 : rounded shoulders, increased thoracic kyphosis, reduced lumbar lordosis in standing, forward trunk lean noted ?  ?PALPAT

## 2021-09-05 ENCOUNTER — Encounter: Payer: Self-pay | Admitting: Physical Medicine and Rehabilitation

## 2021-09-05 ENCOUNTER — Ambulatory Visit (INDEPENDENT_AMBULATORY_CARE_PROVIDER_SITE_OTHER): Payer: Medicare Other | Admitting: Physical Medicine and Rehabilitation

## 2021-09-05 ENCOUNTER — Ambulatory Visit: Payer: Self-pay

## 2021-09-05 VITALS — BP 139/77 | HR 101

## 2021-09-05 DIAGNOSIS — M5416 Radiculopathy, lumbar region: Secondary | ICD-10-CM | POA: Diagnosis not present

## 2021-09-05 MED ORDER — DEXAMETHASONE SODIUM PHOSPHATE 10 MG/ML IJ SOLN
15.0000 mg | Freq: Once | INTRAMUSCULAR | Status: AC
  Administered 2021-09-05: 15 mg

## 2021-09-05 NOTE — Progress Notes (Signed)
Pt state lower back pain that travels down her left leg. Pt state walking, standing and laying down. Pt state she takes pain meds to help ease her pain. ? ?Numeric Pain Rating Scale and Functional Assessment ?Average Pain 5 ? ? ?In the last MONTH (on 0-10 scale) has pain interfered with the following? ? ?1. General activity like being  able to carry out your everyday physical activities such as walking, climbing stairs, carrying groceries, or moving a chair?  ?Rating(10) ? ? ?+Driver, -BT, -Dye Allergies. ? ?

## 2021-09-05 NOTE — Patient Instructions (Signed)
[facility] Physiatry Discharge Instructions  *At any time if you have questions or concerns they can be answered by calling [phone]  All Patients: You may experience an increase in your symptoms for the first 2 days (it can take 2 days to 2 weeks for the steroid/cortisone to have its maximal effect). You may use ice to the site for the first 24 hours; 20 minutes on and 20 minutes off and may use heat after that time. You may resume and continue your current pain medications. If you need a refill please contact the prescribing physician. You may resume your medications if any were stopped for the procedure. You may shower but no swimming, tub bath or Jacuzzi for 24 hours. Please remove bandage after 4 hours. You may resume light activities as tolerated. If you had Spine Injection, you should not drive for the next 3 hours due to anesthetics used in the procedure. Please have someone drive for you.  *If you have had sedation, Valium, Xanax, or lorazepam: Do not drive or use public transportation for 24 hours, do not operating hazardous machinery or make important personal/business decisions for 24 hours.  POSSIBLE STEROID SIDE EFFECTS: If experienced these should only last for a short period. Change in menstrual flow  Edema in (swelling)  Increased appetite Skin flushing (redness)  Skin rash/acne  Thrush (oral) Vaginitis    Increased sweating  Depression Increased blood glucose levels Cramping and leg/calf  Euphoria (feeling happy)  POSSIBLE PROCEDURE SIDE EFFECTS: Please call our office if concerned. Increased pain Increased numbness/tingling  Headache Nausea/vomiting Hematoma (bruising/bleeding) Edema (swelling at the site) Weakness  Infection (red/drainage at site) Fever greater than 100.5F  *In the event of a headache after epidural steroid injection: Drink plenty of fluids, especially water and try to lay flat when possible. If the headache does not get better after a few days  or as always if concerned please call the office.  

## 2021-09-10 ENCOUNTER — Ambulatory Visit (INDEPENDENT_AMBULATORY_CARE_PROVIDER_SITE_OTHER): Payer: Medicare Other | Admitting: Rehabilitative and Restorative Service Providers"

## 2021-09-10 ENCOUNTER — Encounter: Payer: Self-pay | Admitting: Rehabilitative and Restorative Service Providers"

## 2021-09-10 ENCOUNTER — Other Ambulatory Visit: Payer: Self-pay

## 2021-09-10 DIAGNOSIS — R262 Difficulty in walking, not elsewhere classified: Secondary | ICD-10-CM | POA: Diagnosis not present

## 2021-09-10 DIAGNOSIS — M25552 Pain in left hip: Secondary | ICD-10-CM

## 2021-09-10 DIAGNOSIS — R293 Abnormal posture: Secondary | ICD-10-CM

## 2021-09-10 DIAGNOSIS — M5459 Other low back pain: Secondary | ICD-10-CM

## 2021-09-10 DIAGNOSIS — M6281 Muscle weakness (generalized): Secondary | ICD-10-CM

## 2021-09-10 NOTE — Therapy (Signed)
?OUTPATIENT PHYSICAL THERAPY TREATMENT NOTE ? ? ?Patient Name: [name] ?MRN: [id number] ?DOB:1940/07/16, 81 y.o., female ?Today's Date: 09/10/2021 ? ?PCP: [name], MD ?REFERRING PROVIDER: [name], MD ? ?END OF SESSION:  ? PT End of Session - 09/10/21 1518   ? ? Visit Number 4   ? Number of Visits 20   ? Date for PT Re-Evaluation 10/25/21   ? Authorization Type Medicare and [facility] [facility]   ? Progress Note Due on Visit 10   ? PT Start Time 2423   ? PT Stop Time 5361   ? PT Time Calculation (min) 39 min   ? Activity Tolerance Patient tolerated treatment well   ? Behavior During Therapy [facility] for tasks assessed/performed   ? ?  ?  ? ?  ? ? ? ? ?Past Medical History:  ?Diagnosis Date  ? Acute on chronic diastolic (congestive) heart failure ([place])   ? Anemia   ? iron deficiency anemia - secondary to blood loss ( chronic)   ? Anxiety   ? Arthritis   ? endstage changes bilateral knees/bilateral ankles.   ? Asthma   ? Carotid artery occlusion   ? Chronic fatigue   ? Chronic kidney disease   ? Closed left hip fracture ([place])   ? Clotting disorder ([place])   ? pt denies this  ? Contusion of left knee   ? due to fall 1/14.  ? COPD (chronic obstructive pulmonary disease) ([place])   ? pulmonary fibrosis  ? Depression, reactive   ? Diabetes mellitus   ? type II   ? Diastolic dysfunction   ? Difficulty in walking   ? Family history of heart disease   ? Generalized muscle weakness   ? Gout   ? High cholesterol   ? History of falling   ? Hypertension   ? Hypothyroidism   ? Interstitial lung disease ([place])   ? Meningioma of left sphenoid wing involving cavernous sinus ([place]) 02/17/2012  ? Continue diplopia, left eye pain and left headaches.    ? Morbid obesity ([place])   ? Neuromuscular disorder ([place])   ? diabetic neuropathy   ? Normal coronary arteries   ? cardiac catheterization performed  10/31/14  ? Pneumonia   ? RA (rheumatoid arthritis) ([place])   ? has been off methotreaxte since 10/13.  ? Spinal stenosis of lumbar region   ?  Thyroid disease   ? Unspecified lack of coordination   ? URI (upper respiratory infection)   ? ?Past Surgical History:  ?Procedure Laterality Date  ? ABDOMINAL HYSTERECTOMY    ? BRAIN SURGERY    ? Gamma knife 10/13. Needs repeat spring  '14  ? CARDIAC CATHETERIZATION N/A 10/31/2014  ? Procedure: Right/Left Heart Cath and Coronary Angiography;  Surgeon: [name], MD;  Location: [place] [facility];  Service: Cardiovascular;  Laterality: N/A;  ? CYST EXCISION    ? 2 on face  ? CYST REMOVAL NECK    ? ESOPHAGOGASTRODUODENOSCOPY (EGD) WITH PROPOFOL N/A 09/14/2014  ? Procedure: ESOPHAGOGASTRODUODENOSCOPY (EGD) WITH PROPOFOL;  Surgeon: [name], MD;  [facility];  Service: Endoscopy;  Laterality: N/A;  ? INCISION AND DRAINAGE HIP Left 01/16/2017  ? Procedure: IRRIGATION AND DEBRIDEMENT LEFT HIP;  Surgeon: [name], MD;  [facility]: [facility];  Service: Orthopedics;  Laterality: Left;  ? INTRAMEDULLARY (IM) NAIL INTERTROCHANTERIC Left 11/29/2016  ? Procedure: INTRAMEDULLARY (IM) NAIL INTERTROCHANTRIC;  Surgeon: [name], MD;  Location: [place];  Service: Orthopedics;  Laterality: Left;  ? OVARY SURGERY    ? RIGHT HEART CATH N/A 08/12/2021  ? Procedure: RIGHT HEART CATH;  Surgeon: Lorretta Harp, MD;  Location: [place] [facility];  Service: Cardiovascular;  Laterality: N/A;  ? SHOULDER SURGERY Left   ? TONSILLECTOMY  age 22  ? VIDEO BRONCHOSCOPY Bilateral 05/31/2013  ? Procedure: VIDEO BRONCHOSCOPY WITHOUT FLUORO;  Surgeon: Brand Males, MD;  Location: [place];  Service: Cardiopulmonary;  Laterality: Bilateral;  ? video bronscoscopy  05/19/1998  ? lung  ? ?Patient Active Problem List  ? Diagnosis Date Noted  ? Pulmonary HTN ([place])   ? Unspecified protein-calorie malnutrition ([place]) 04/05/2021  ? Pressure injury of skin 03/31/2021  ? UTI (urinary tract infection) 03/31/2021  ? Sepsis due to urinary tract infection ([place]) 03/30/2021  ? Acute encephalopathy 03/30/2021   ? Thrombocytopenia ([place]) 03/30/2021  ? Diabetic neuropathy ([place]) 05/24/2019  ? Hypokalemia 04/21/2019  ? Anticoagulated 06/25/2018  ? Chronic pain 06/25/2018  ? CRI (chronic renal insufficiency), stage 3 (moderate) ([place]) 06/25/2018  ? Drug-induced constipation 06/18/2018  ? DVT (deep venous thrombosis) ([place]) 06/03/2018  ? Primary osteoarthritis of both knees 05/26/2018  ? Body mass index (BMI) of 50-59.9 in adult [facility]) 05/26/2018  ? Acquired hypothyroidism 05/26/2018  ? Chronic bilateral low back pain without sciatica 02/17/2018  ? Astigmatism with presbyopia, bilateral 03/26/2017  ? Cortical age-related cataract of both eyes 03/26/2017  ? Family history of glaucoma 03/26/2017  ? Long term current use of oral hypoglycemic drug 03/26/2017  ? Nuclear sclerotic cataract of both eyes 03/26/2017  ? Pain in left hip 02/18/2017  ? Closed nondisplaced intertrochanteric fracture of left femur with delayed healing 01/14/2017  ? Gout 11/29/2016  ? Depression 11/29/2016  ? Closed left hip fracture ([place]) 11/29/2016  ? GERD (gastroesophageal reflux disease) 11/29/2016  ? Closed intertrochanteric fracture of hip, left, initial encounter ([place]) 11/29/2016  ? Physical deconditioning 07/31/2015  ? Pulmonary fibrosis, postinflammatory ([place]) 07/31/2015  ? Bronchiectasis without complication ([place]) 62/13/0865  ? Tendinitis of left rotator cuff 06/13/2015  ? Chronic diastolic CHF (congestive heart failure) ([place]) 04/21/2015  ? Acute kidney injury superimposed on chronic kidney disease ([place]) 04/17/2015  ? Chronic respiratory failure with hypoxia ([place]) 02/09/2015  ? Dyspnea and respiratory abnormality 01/03/2015  ? Normal coronary arteries 11/02/2014  ? Morbid obesity ([place]) 10/30/2014  ? Dysphagia 08/21/2014  ? Chronic fatigue 06/21/2014  ? DOE (dyspnea on exertion) 05/24/2014  ? Primary osteoarthritis of left knee 04/26/2014  ? High risk medication use 04/17/2014  ? Aphasia 02/12/2014  ? 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 Lumbar facet arthropathy 01/18/2014  ? Bilateral edema of lower extremity 05/30/2013  ? Chronic cough 05/24/2013  ? ILD (interstitial lung disease) ([place]) 04/10/2013  ? Spinal stenosis of lumbar region with radiculopathy 07/12/2012  ? Inability to walk 07/12/2012  ? Meningioma of left sphenoid wing involving cavernous sinus ([place]) 02/17/2012  ? Chest pain of uncertain etiology 78/46/9629  ? Rheumatoid arthritis ([place]) 01/28/2012  ? Primary osteoarthritis of right knee 01/28/2012  ? Dyslipidemia   ? Thyroid disease   ? Essential hypertension   ? ? ?REFERRING PROVIDER: [name], MD ?  ?REFERRING DIAG: M54.50,G89.29 (ICD-10-CM) - Chronic bilateral low back pain without sciatica ?M25.552 (ICD-10-CM) - Pain in left hip ? ?ONSET DATE:  Dec 2022 ? ?THERAPY DIAG:  ?Other low back pain ? ?Pain in left hip ? ?Muscle weakness (generalized) ? ?Difficulty in walking, not elsewhere  classified ? ?Abnormal posture ? ?PERTINENT HISTORY: HTN, hypothyroidism, history of RA, COPD, DM, high cholesterol, CHF.  ORIF Lt femur from hip fracture "about 4-5 years ago" ? ?PRECAUTIONS: may require supplemental oxygen ? ?SUBJECTIVE: Pt indicated back 4/10 at worst in last few days.  She indicated she thought the injection helped some for her back. Had complaints of bilateral knee pain and some swelling in legs.  ? ?PAIN:  ?Are you having pain? Yes:  ?NPRS scale: at worst today in back 4/10 ?Pain location: back pain, Lt leg pain to knee ?Pain description: burning, sharp, shooting ?Aggravating factors: worse in morning, bending, walking prolonged, standing prolonged ?Relieving factors: medicine ? ?OBJECTIVE:  ?  ?PATIENT SURVEYS:  ?08/16/2021 FOTO intake: 33 predicted:  37 ?  ?SCREENING FOR RED FLAGS: ?08/16/2021 ?Bowel or bladder incontinence: No ?Cauda equina syndrome: No ?  ?  ?COGNITION: ?           08/16/2021 Overall cognitive status: Within functional limits for tasks assessed                     ?             ?SENSATION: ?08/16/2021 Light touch: WFL ?  ?MUSCLE LENGTH: ?08/16/2021:  none tested today ?  ?POSTURE:  ?08/16/2021 : rounded shoulders, increased thoracic kyphosis, reduced lumbar lordosis in standing, forward trun

## 2021-09-12 ENCOUNTER — Encounter: Payer: Self-pay | Admitting: Rehabilitative and Restorative Service Providers"

## 2021-09-12 ENCOUNTER — Ambulatory Visit (INDEPENDENT_AMBULATORY_CARE_PROVIDER_SITE_OTHER): Payer: Medicare Other | Admitting: Rehabilitative and Restorative Service Providers"

## 2021-09-12 ENCOUNTER — Other Ambulatory Visit: Payer: Self-pay

## 2021-09-12 DIAGNOSIS — M25552 Pain in left hip: Secondary | ICD-10-CM | POA: Diagnosis not present

## 2021-09-12 DIAGNOSIS — M5459 Other low back pain: Secondary | ICD-10-CM | POA: Diagnosis not present

## 2021-09-12 DIAGNOSIS — R293 Abnormal posture: Secondary | ICD-10-CM | POA: Diagnosis not present

## 2021-09-12 DIAGNOSIS — R262 Difficulty in walking, not elsewhere classified: Secondary | ICD-10-CM | POA: Diagnosis not present

## 2021-09-12 DIAGNOSIS — M6281 Muscle weakness (generalized): Secondary | ICD-10-CM | POA: Diagnosis not present

## 2021-09-12 NOTE — Therapy (Signed)
?OUTPATIENT PHYSICAL THERAPY TREATMENT NOTE ? ? ?Patient Name: [name] ?MRN: [id number] ?DOB:06-12-1940, 81 y.o., female ?Today's Date: 09/12/2021 ? ?PCP: [name], MD ?REFERRING PROVIDER: [name], MD ? ?END OF SESSION:  ? PT End of Session - 09/12/21 1532   ? ? Visit Number 5   ? Number of Visits 20   ? Date for PT Re-Evaluation 10/25/21   ? Authorization Type Medicare and [place] [place] [facility]   ? Progress Note Due on Visit 10   ? PT Start Time 1528   ? PT Stop Time 0240   ? PT Time Calculation (min) 26 min   ? Activity Tolerance Patient tolerated treatment well   ? Behavior During Therapy [facility] for tasks assessed/performed   ? ?  ?  ? ?  ? ? ? ? ? ?Past Medical History:  ?Diagnosis Date  ? Acute on chronic diastolic (congestive) heart failure ([place])   ? Anemia   ? iron deficiency anemia - secondary to blood loss ( chronic)   ? Anxiety   ? Arthritis   ? endstage changes bilateral knees/bilateral ankles.   ? Asthma   ? Carotid artery occlusion   ? Chronic fatigue   ? Chronic kidney disease   ? Closed left hip fracture ([place])   ? Clotting disorder ([place])   ? pt denies this  ? Contusion of left knee   ? due to fall 1/14.  ? COPD (chronic obstructive pulmonary disease) ([place])   ? pulmonary fibrosis  ? Depression, reactive   ? Diabetes mellitus   ? type II   ? Diastolic dysfunction   ? Difficulty in walking   ? Family history of heart disease   ? Generalized muscle weakness   ? Gout   ? High cholesterol   ? History of falling   ? Hypertension   ? Hypothyroidism   ? Interstitial lung disease ([place])   ? Meningioma of left sphenoid wing involving cavernous sinus ([place]) 02/17/2012  ? Continue diplopia, left eye pain and left headaches.    ? Morbid obesity ([place])   ? Neuromuscular disorder ([place])   ? diabetic neuropathy   ? Normal coronary arteries   ? cardiac catheterization performed  10/31/14  ? Pneumonia   ? RA (rheumatoid arthritis) ([place])   ? has been off methotreaxte since 10/13.  ? Spinal stenosis of lumbar region    ? Thyroid disease   ? Unspecified lack of coordination   ? URI (upper respiratory infection)   ? ?Past Surgical History:  ?Procedure Laterality Date  ? ABDOMINAL HYSTERECTOMY    ? BRAIN SURGERY    ? Gamma knife 10/13. Needs repeat spring  '14  ? CARDIAC CATHETERIZATION N/A 10/31/2014  ? Procedure: Right/Left Heart Cath and Coronary Angiography;  Surgeon: [name], MD;  Location: [place] [facility];  Service: Cardiovascular;  Laterality: N/A;  ? CYST EXCISION    ? 2 on face  ? CYST REMOVAL NECK    ? ESOPHAGOGASTRODUODENOSCOPY (EGD) WITH PROPOFOL N/A 09/14/2014  ? Procedure: ESOPHAGOGASTRODUODENOSCOPY (EGD) WITH PROPOFOL;  Surgeon: [name], MD;  [facility];  Service: Endoscopy;  Laterality: N/A;  ? INCISION AND DRAINAGE HIP Left 01/16/2017  ? Procedure: IRRIGATION AND DEBRIDEMENT LEFT HIP;  Surgeon: [name], MD;  [facility];  Service: Orthopedics;  Laterality: Left;  ? INTRAMEDULLARY (IM) NAIL INTERTROCHANTERIC Left 11/29/2016  ? Procedure: INTRAMEDULLARY (IM) NAIL INTERTROCHANTRIC;  Surgeon: [name], MD;  Location: [place];  Service: Orthopedics;  Laterality: Left;  ? OVARY SURGERY    ? RIGHT HEART CATH N/A 08/12/2021  ? Procedure: RIGHT HEART CATH;  Surgeon: Lorretta Harp, MD;  Location: [place] [facility];  Service: Cardiovascular;  Laterality: N/A;  ? SHOULDER SURGERY Left   ? TONSILLECTOMY  age 35  ? VIDEO BRONCHOSCOPY Bilateral 05/31/2013  ? Procedure: VIDEO BRONCHOSCOPY WITHOUT FLUORO;  Surgeon: Brand Males, MD;  Location: [place];  Service: Cardiopulmonary;  Laterality: Bilateral;  ? video bronscoscopy  05/19/1998  ? lung  ? ?Patient Active Problem List  ? Diagnosis Date Noted  ? Pulmonary HTN ([place])   ? Unspecified protein-calorie malnutrition ([place]) 04/05/2021  ? Pressure injury of skin 03/31/2021  ? UTI (urinary tract infection) 03/31/2021  ? Sepsis due to urinary tract infection ([place]) 03/30/2021  ? Acute encephalopathy  03/30/2021  ? Thrombocytopenia ([place]) 03/30/2021  ? Diabetic neuropathy ([place]) 05/24/2019  ? Hypokalemia 04/21/2019  ? Anticoagulated 06/25/2018  ? Chronic pain 06/25/2018  ? CRI (chronic renal insufficiency), stage 3 (moderate) ([place]) 06/25/2018  ? Drug-induced constipation 06/18/2018  ? DVT (deep venous thrombosis) ([place]) 06/03/2018  ? Primary osteoarthritis of both knees 05/26/2018  ? Body mass index (BMI) of 50-59.9 in adult [facility]) 05/26/2018  ? Acquired hypothyroidism 05/26/2018  ? Chronic bilateral low back pain without sciatica 02/17/2018  ? Astigmatism with presbyopia, bilateral 03/26/2017  ? Cortical age-related cataract of both eyes 03/26/2017  ? Family history of glaucoma 03/26/2017  ? Long term current use of oral hypoglycemic drug 03/26/2017  ? Nuclear sclerotic cataract of both eyes 03/26/2017  ? Pain in left hip 02/18/2017  ? Closed nondisplaced intertrochanteric fracture of left femur with delayed healing 01/14/2017  ? Gout 11/29/2016  ? Depression 11/29/2016  ? Closed left hip fracture ([place]) 11/29/2016  ? GERD (gastroesophageal reflux disease) 11/29/2016  ? Closed intertrochanteric fracture of hip, left, initial encounter ([place]) 11/29/2016  ? Physical deconditioning 07/31/2015  ? Pulmonary fibrosis, postinflammatory ([place]) 07/31/2015  ? Bronchiectasis without complication ([place]) 75/17/0017  ? Tendinitis of left rotator cuff 06/13/2015  ? Chronic diastolic CHF (congestive heart failure) ([place]) 04/21/2015  ? Acute kidney injury superimposed on chronic kidney disease ([place]) 04/17/2015  ? Chronic respiratory failure with hypoxia ([place]) 02/09/2015  ? Dyspnea and respiratory abnormality 01/03/2015  ? Normal coronary arteries 11/02/2014  ? Morbid obesity ([place]) 10/30/2014  ? Dysphagia 08/21/2014  ? Chronic fatigue 06/21/2014  ? DOE (dyspnea on exertion) 05/24/2014  ? Primary osteoarthritis of left knee 04/26/2014  ? High risk medication use 04/17/2014  ? Aphasia 02/12/2014  ? Type 2 diabetes mellitus with sensory  neuropathy ([place]) 01/18/2014  ? Lumbar facet arthropathy 01/18/2014  ? Bilateral edema of lower extremity 05/30/2013  ? Chronic cough 05/24/2013  ? ILD (interstitial lung disease) ([place]) 04/10/2013  ? Spinal stenosis of lumbar region with radiculopathy 07/12/2012  ? Inability to walk 07/12/2012  ? Meningioma of left sphenoid wing involving cavernous sinus ([place]) 02/17/2012  ? Chest pain of uncertain etiology 49/44/9675  ? Rheumatoid arthritis ([place]) 01/28/2012  ? Primary osteoarthritis of right knee 01/28/2012  ? Dyslipidemia   ? Thyroid disease   ? Essential hypertension   ? ? ?REFERRING PROVIDER: [name], MD ?  ?REFERRING DIAG: M54.50,G89.29 (ICD-10-CM) - Chronic bilateral low back pain without sciatica ?M25.552 (ICD-10-CM) - Pain in left hip ? ?ONSET DATE:  Dec 2022 ? ?THERAPY DIAG:  ?Other low back pain ? ?Pain in left hip ? ?Muscle weakness (generalized) ? ?Difficulty in walking, not elsewhere  classified ? ?Abnormal posture ? ?PERTINENT HISTORY: HTN, hypothyroidism, history of RA, COPD, DM, high cholesterol, CHF.  ORIF Lt femur from hip fracture "about 4-5 years ago" ? ?PRECAUTIONS: may require supplemental oxygen ? ?SUBJECTIVE: Pt indicated no pain at rest today.  Reported 4/10 at worst today (in morning) ? ?PAIN:  ?Are you having pain? Yes:  ?NPRS scale: at worst today in back 4/10 ?Pain location: back pain, Lt leg pain to knee ?Pain description: burning, sharp, shooting ?Aggravating factors: worse in morning ?Relieving factors: medicine ? ?OBJECTIVE:  ?  ?PATIENT SURVEYS:  ?09/12/2021: update 43% ?08/16/2021 FOTO intake: 33 predicted:  37 ?  ?SCREENING FOR RED FLAGS: ?08/16/2021 ?Bowel or bladder incontinence: No ?Cauda equina syndrome: No ?  ?  ?COGNITION: ?           08/16/2021 Overall cognitive status: Within functional limits for tasks assessed                     ?            ?SENSATION: ?08/16/2021 Light touch: WFL ?  ?MUSCLE LENGTH: ?08/16/2021:  none tested today ?  ?POSTURE:  ?08/16/2021 : rounded  shoulders, increased thoracic kyphosis, reduced lumbar lordosis in standing, forward trunk lean noted ?  ?PALPATION: ?08/16/2021 : Tenderness across lower back bilateral, Lt > Rt, Lateral Lt hip tenderness to touch- t

## 2021-09-13 ENCOUNTER — Encounter: Payer: Self-pay | Admitting: Internal Medicine

## 2021-09-13 ENCOUNTER — Ambulatory Visit (INDEPENDENT_AMBULATORY_CARE_PROVIDER_SITE_OTHER): Payer: Medicare Other | Admitting: Internal Medicine

## 2021-09-13 VITALS — BP 128/72 | HR 98 | Ht 64.0 in | Wt 224.0 lb

## 2021-09-13 DIAGNOSIS — J8489 Other specified interstitial pulmonary diseases: Secondary | ICD-10-CM | POA: Diagnosis not present

## 2021-09-13 DIAGNOSIS — M359 Systemic involvement of connective tissue, unspecified: Secondary | ICD-10-CM

## 2021-09-13 NOTE — Patient Instructions (Addendum)
ICD-10-CM   ?1. Interstitial lung disease due to connective tissue disease ([place])  J84.89   ? M35.9   ?  ?2. History of rheumatoid arthritis  Z87.39   ?  ?3. Immunosuppression due to drug therapy [facility])  H68.616   ? Z79.899   ?  ?4. Current chronic use of systemic steroids  Z79.52   ?  ? ?ILD slowly getting worse ?No evidence of pulmonary hypertension on heart cath  March 2023 ? ? ?Plan ? - start ofev 120m twice daily ?  - this is lower dose ? - if you do well on lower dose for 3 months then we can escalate further ? ?Followup ? -6 weeks with APP To review uptake with ofev ?

## 2021-09-13 NOTE — Progress Notes (Signed)
? ? ? ? ?OV 01/03/2015 ? ?Chief Complaint  ?Patient presents with  ? Follow-up  ?  Pt c/o DOE, resolving cough that started over the weekend, chest discomfort when SOB and when lying down. Pt c/o increasing fatigue.   ? ?She has severe class 3 dyspnea with hypoxemia on account of BMI 50, diaast dsyfn, chronic pain, deconditioning and ILD due to RA on celcept/pred/bactrim ? ? ?I personally last saw her in march 2-016. Since then has seen other provicers in this office and most recently in June 2016. In June 2016 admitted for acute CHF - cath showed mild pulm htn only with PA mean 24 and minimal CAD (reviewed chart of procedures June 2016). Dx as acute diast chf. Since then better. SHe had fu HRCT 11/29/14 that shows progression in ILD since march 2016 and now with UIP pattern (personally visualized image). She is finally accepting of fact that ILD is not makin cause of dyspnea and obesity is main problem. She is wondering about dc cellcept/pred/bactrim so as to minimize polypharmacy and side effect profile ? ? ? ?OV 02/09/2015 ? ?Chief Complaint  ?Patient presents with  ? Follow-up  ?  Pt states her breathing is doing well the past few days. Pt recently went to [facility] ED for CP. Pt c/o prod cough with tan mucus. Pt denies CP/tightness  ? ? ?She has severe class 3 dyspnea with hypoxemia on account of BMI 50, diaast dsyfn, chronic pain, deconditioning and ILD due to RA. She is having progressive ILD. But given her miserable quality of life and polypharmacy and other side effects we decided to stop the CellCept and Bactrim. We also advise prednisone taper. This was all in the end of August 2016. But on 01/27/2015 she ended up in the emergency room with some respiratory difficulty. Her prednisone was bumped up. She subsequently saw her rheumatologist [name] was now tapering her prednisone again. There is also some concerns of headaches and possibly temporal arteritis based on her description and apparently some kind of a  biopsy scheduled. She feels that since stopping her CellCept and Bactrim her cough is returned. Nevertheless weight and knee pain and back pain continued to be major issues for her along with polypharmacy. Her Polysorb was includes MS Contin ? ?[name] ? ? ? ?OV 02/21/2015  - a ?ACUTE VISIT ? ?Chief Complaint  ?Patient presents with  ? Acute Visit  ?  Pt c/o of chest tightness, productive cough with white/yellow mucus, and wheezing. Pt also c/o of feeling abdominal swelling/distention. Pt also c/o of constant fatigue. Pt states symptoms have been present x1 week.   ? ? ?Acute visit for this morbidly obese, rheumatoid arthritis patient with ILD who is now off CellCept ? ?I saw her as recently as 02/09/2015. She reports that for the past 1 week she's having increased chest congestion, chest tightness, wheezing, shortness of breath, cough and yellow  sputum more than baseline. Symptoms are rated as moderate in severity. Relieved by inhaler. She has tried Mucinex for 1 week with some initial partial relief but symptoms progressed. So she made an acute visit. ? ?She continues to stay off CellCept. She is not on prednisone 10 mg per day. She tells me [name] has held her prednisone at the dose because of concerns of temporal arteritis but apparently now that his diagnoses being eliminated in the differential diagnosis. She will see [name] in the next few weeks to decide on  further prednisone taper. ? ? ? ?OV 04/16/2015 ? ?Chief Complaint  ?Patient presents with  ? Follow-up  ?  Pt states she felt much better after completing the abx and pred. Pt states her SOB is at basline, prod cough with yellow mucus at times, pt c/o chest tightness.   ? ? ?Morbidly obese female with rheumatoid arthritis with interstitial lung disease UIP pattern with associated deconditioning and diastolic dysfunction ? ?She continues to remain off CellCept. Last seen in October 2016. At that time I discharged her from the  office with doxycycline and prednisone taper. After that early November 2016 she was given another course of Doxy and prednisone burst. This did not help. Sputum culture at this time showed normal flora. She called again with the middle of November was given Levaquin and prednisone burst. She feels Levaquin helped her immensely. At this point in time she is reporting progressive dyspnea and also worsening arthritis. She wants to go back on immunomodulators again. She has discussed Imuran with [name]. She is due to see [name] tomorrow. She also feels that she is unable to come off prednisone. She feels at a minimum she needs 10 mg of prednisone per day. She will discuss this with Dr. D her rheumatologist tomorrow. She's also interested in a second opinion at [facility] interstitial lung disease clinic which I did discuss with her most recently. She continues to use oxygen. ? ? ? ? ?OV 07/17/2015 ? ?Chief Complaint  ?Patient presents with  ? Follow-up  ?  Pt states that she has improved. Pt c/o continued SOB with exertion, some ocugh and wheeze, and some intermittent chest tightness. Pt states that she does sleep a lot. Pt states that her heart rate remains high and that cardiology said it was due to her lungs. Pt is currently on Lasix, 14m, but still seems to retain a lot of fluid and is not seeming to go to the bathroom as frequently.   ? ?Follow-up interstitial lung disease secondary to rheumatoid arthritis in the setting of morbid obesity, poor quality of life and heavy chronic pain opioid use and depressive symptoms ? ?Last seen November 2016. Since then she has seen [name] rheumatology clinic. She was given instructions on Imuran which she has red but she has not decided whether she should take it or not. She subsequently has followed up with [facility] interstitial lung disease clinic. CT scan of the chest reviewed result shows possible UIP pattern 06/12/2015. She had autoimmune profile which was strongly positive for  both rheumatoid factor and cyclic citrulline peptide both markers for rheumatoid arthritis. She did see Dr. CMargreta JourneyB on 07/02/2015. It appears that the note is not complete but according to the patient was supposed to be a multidisciplinary conference and they will get back to her. It is possible a second immunomodulators agent and is being contemplated but patient is leery of this given prior issues with opportunistic infection not otherwise specified and admissions to the hospital. In the interim she is also seen cardiology Janr 2017 Dr. BGwenlyn Foundwho did not feel dyspnea was cardiac related. I reviewed his note ? ?OV 10/17/2015 ? ?Chief Complaint  ?Patient presents with  ? Follow-up  ?  Pt reports she feels improved. Pt c/o continued SOB with exertion, some wheeze and occasional cough. Pt denies CP/tightness.   ? ? ?Follow-up interstitial lung disease secondary to rheumatoid arthritis in the setting of morbid obesity, poor quality of life and heavy chronic pain opioid  use and depressive symptoms ? ? ?Since seeing me last in February 2017. She followed up at [facility] again pulmonary clinic in March 2017. I reviewed those notes. She saw Dr. Margreta Journey B on 07/31/2015. Due to the presence of bronchiectasis she's been started on  azithromycin 3 times a week. She says this has helped. In addition due to physical deconditioning and obesity she is getting home physical therapy is also help. She is interested in pulmonary rehabilitation although given obesity and back pain can be challenging. She nevertheless wants to try. She is on daily prednisone 10 mg a day. She met with [name] in the rheumatology clinic and has been referred now to [facility] rheumatology clinic for second opinion for a second immunomodulators. She continues to be morbidly obese and has been losing weight. She is somewhat interested in visiting with a [facility] for weight loss. ?  ?OV 02/18/2016 ? ?Chief Complaint  ?Patient presents with  ?  Follow-up  ?  Pt states she did not go to pulm rehab d/t BIL knee pain. Pt denies change in SOB, significant cough, CP/tightness and f/c/s. Pt states she is exercising at home. pt states she is doing well overall

## 2021-09-16 ENCOUNTER — Telehealth: Payer: Self-pay

## 2021-09-16 ENCOUNTER — Other Ambulatory Visit (HOSPITAL_COMMUNITY): Payer: Self-pay

## 2021-09-16 DIAGNOSIS — M359 Systemic involvement of connective tissue, unspecified: Secondary | ICD-10-CM

## 2021-09-16 DIAGNOSIS — Z5181 Encounter for therapeutic drug level monitoring: Secondary | ICD-10-CM

## 2021-09-16 NOTE — Telephone Encounter (Signed)
Received New start paperwork for [employer]. Will update as we work through the benefits process. Per Raquel Sarna, pt took her portion home and will return ASAP. ?

## 2021-09-17 ENCOUNTER — Encounter: Payer: Self-pay | Admitting: Registered Nurse

## 2021-09-17 ENCOUNTER — Encounter: Payer: Medicare Other | Attending: Physical Medicine & Rehabilitation | Admitting: Registered Nurse

## 2021-09-17 ENCOUNTER — Encounter: Payer: Medicare Other | Admitting: Rehabilitative and Restorative Service Providers"

## 2021-09-17 VITALS — BP 124/77 | HR 94 | Ht 64.0 in

## 2021-09-17 DIAGNOSIS — M7061 Trochanteric bursitis, right hip: Secondary | ICD-10-CM | POA: Insufficient documentation

## 2021-09-17 DIAGNOSIS — M545 Low back pain, unspecified: Secondary | ICD-10-CM | POA: Diagnosis not present

## 2021-09-17 DIAGNOSIS — M7062 Trochanteric bursitis, left hip: Secondary | ICD-10-CM | POA: Diagnosis not present

## 2021-09-17 DIAGNOSIS — Z79891 Long term (current) use of opiate analgesic: Secondary | ICD-10-CM | POA: Diagnosis not present

## 2021-09-17 DIAGNOSIS — M5416 Radiculopathy, lumbar region: Secondary | ICD-10-CM | POA: Diagnosis not present

## 2021-09-17 DIAGNOSIS — G8929 Other chronic pain: Secondary | ICD-10-CM | POA: Diagnosis not present

## 2021-09-17 DIAGNOSIS — M170 Bilateral primary osteoarthritis of knee: Secondary | ICD-10-CM | POA: Diagnosis not present

## 2021-09-17 DIAGNOSIS — M546 Pain in thoracic spine: Secondary | ICD-10-CM | POA: Diagnosis not present

## 2021-09-17 DIAGNOSIS — M5412 Radiculopathy, cervical region: Secondary | ICD-10-CM | POA: Insufficient documentation

## 2021-09-17 DIAGNOSIS — Z5181 Encounter for therapeutic drug level monitoring: Secondary | ICD-10-CM | POA: Diagnosis not present

## 2021-09-17 DIAGNOSIS — M62838 Other muscle spasm: Secondary | ICD-10-CM | POA: Diagnosis not present

## 2021-09-17 DIAGNOSIS — G894 Chronic pain syndrome: Secondary | ICD-10-CM | POA: Insufficient documentation

## 2021-09-17 DIAGNOSIS — M542 Cervicalgia: Secondary | ICD-10-CM | POA: Insufficient documentation

## 2021-09-17 MED ORDER — CYCLOBENZAPRINE HCL 5 MG PO TABS: 5.0000 mg | ORAL_TABLET | Freq: Every evening | ORAL | 0 refills | Status: DC | PRN

## 2021-09-17 MED ORDER — MORPHINE SULFATE ER 15 MG PO TBCR: 15.0000 mg | EXTENDED_RELEASE_TABLET | Freq: Two times a day (BID) | ORAL | 0 refills | Status: DC

## 2021-09-17 MED ORDER — MORPHINE SULFATE 15 MG PO TABS: ORAL_TABLET | ORAL | 0 refills | Status: DC

## 2021-09-17 MED ORDER — MORPHINE SULFATE ER 15 MG PO TBCR
15.0000 mg | EXTENDED_RELEASE_TABLET | Freq: Two times a day (BID) | ORAL | 0 refills | Status: DC
Start: 2021-09-17 — End: 2021-09-17

## 2021-09-17 MED ORDER — DULOXETINE HCL 60 MG PO CPEP: 60.0000 mg | ORAL_CAPSULE | Freq: Every day | ORAL | 0 refills | Status: DC

## 2021-09-17 NOTE — Progress Notes (Signed)
? ?Subjective:  ? ? Patient ID: [name], female    DOB: Apr 11, 1941, 81 y.o.   MRN: [id number] ? ?HPI: [name] is a 81 y.o. female who returns for follow up appointment for chronic pain and medication refill. She states her pain is located in her neck radiating into her left shoulder,bilateral shoulders, mid- lower back pain radiating into her right lower extremity. She rates her pain 6.Her current exercise regime is attending physical therapy two days a week, walking and performing stretching exercises. ? ?Ms. Obarr Morphine equivalent is 60.00 MME.   UDS ordered today.  ?  ? ?Pain Inventory ?Average Pain 5 ?Pain Right Now 6 ?My pain is burning, tingling, and aching ? ?In the last 24 hours, has pain interfered with the following? ?General activity 7 ?Relation with others 7 ?Enjoyment of life 7 ?What TIME of day is your pain at its worst? morning  ?Sleep (in general) Fair ? ?Pain is worse with: walking, bending, and standing ?Pain improves with: rest, heat/ice, therapy/exercise, pacing activities, medication, and injections ?Relief from Meds: 7 ? ?Family History  ?Problem Relation Age of Onset  ? Diabetes Mother   ? Heart attack Mother   ? Hypertension Father   ? Lung cancer Father   ? Diabetes Sister   ? Breast cancer Sister   ? Breast cancer Sister   ? Diabetes Brother   ? Hypertension Brother   ? Heart disease Brother   ? Heart attack Brother   ? Kidney cancer Brother   ? Gout Brother   ? Kidney failure Brother   ?     x 5  ? Esophageal cancer Brother   ? Stomach cancer Brother   ? Prostate cancer Brother   ? Uterine cancer Daughter   ? Hypertension Daughter   ? Hypertension Daughter   ? Rheum arthritis Maternal Uncle   ? Hypertension Son   ? Heart murmur Son   ? ?Social History  ? ?Socioeconomic History  ? Marital status: Married  ?  Spouse name: Not on file  ? Number of children: 6  ? Years of education: college  ? Highest education level: Not on file  ?Occupational History  ? Occupation:  retired Marine scientist  ?Tobacco Use  ? Smoking status: Never  ? Smokeless tobacco: Never  ?Vaping Use  ? Vaping Use: Never used  ?Substance and Sexual Activity  ? Alcohol use: No  ? Drug use: No  ? Sexual activity: Not Currently  ?Other Topics Concern  ? Not on file  ?Social History Narrative  ? Patient consumes 2-3 cups coffee per day.  ? ?Social Determinants of Health  ? ?Financial Resource Strain: Low Risk   ? Difficulty of Paying Living Expenses: Not hard at all  ?Food Insecurity: No Food Insecurity  ? Worried About Charity fundraiser in the Last Year: Never true  ? Ran Out of Food in the Last Year: Never true  ?Transportation Needs: No Transportation Needs  ? Lack of Transportation (Medical): No  ? Lack of Transportation (Non-Medical): No  ?Physical Activity: Not on file  ?Stress: No Stress Concern Present  ? Feeling of Stress : Not at all  ?Social Connections: Moderately Integrated  ? Frequency of Communication with Friends and Family: Twice a week  ? Frequency of Social Gatherings with Friends and Family: Twice a week  ? Attends Religious Services: 1 to 4 times per year  ? Active Member of Clubs or Organizations: No  ? Attends Club  or Organization Meetings: Never  ? Marital Status: Married  ? ?Past Surgical History:  ?Procedure Laterality Date  ? ABDOMINAL HYSTERECTOMY    ? BRAIN SURGERY    ? Gamma knife 10/13. Needs repeat spring  '14  ? CARDIAC CATHETERIZATION N/A 10/31/2014  ? Procedure: Right/Left Heart Cath and Coronary Angiography;  Surgeon: [name], MD;  Location: [place] [facility];  Service: Cardiovascular;  Laterality: N/A;  ? CYST EXCISION    ? 2 on face  ? CYST REMOVAL NECK    ? ESOPHAGOGASTRODUODENOSCOPY (EGD) WITH PROPOFOL N/A 09/14/2014  ? Procedure: ESOPHAGOGASTRODUODENOSCOPY (EGD) WITH PROPOFOL;  Surgeon: [name], MD;  Location: [facility];  Service: Endoscopy;  Laterality: N/A;  ? INCISION AND DRAINAGE HIP Left 01/16/2017  ? Procedure: IRRIGATION AND DEBRIDEMENT LEFT HIP;   Surgeon: [name], MD;  Location: [facility];  Service: Orthopedics;  Laterality: Left;  ? INTRAMEDULLARY (IM) NAIL INTERTROCHANTERIC Left 11/29/2016  ? Procedure: INTRAMEDULLARY (IM) NAIL INTERTROCHANTRIC;  Surgeon: [name], MD;  Location: [place];  Service: Orthopedics;  Laterality: Left;  ? OVARY SURGERY    ? RIGHT HEART CATH N/A 08/12/2021  ? Procedure: RIGHT HEART CATH;  Surgeon: Lorretta Harp, MD;  Location: [place] [facility];  Service: Cardiovascular;  Laterality: N/A;  ? SHOULDER SURGERY Left   ? TONSILLECTOMY  age 85  ? VIDEO BRONCHOSCOPY Bilateral 05/31/2013  ? Procedure: VIDEO BRONCHOSCOPY WITHOUT FLUORO;  Surgeon: Brand Males, MD;  Location: [place];  Service: Cardiopulmonary;  Laterality: Bilateral;  ? video bronscoscopy  05/19/1998  ? lung  ? ?Past Surgical History:  ?Procedure Laterality Date  ? ABDOMINAL HYSTERECTOMY    ? BRAIN SURGERY    ? Gamma knife 10/13. Needs repeat spring  '14  ? CARDIAC CATHETERIZATION N/A 10/31/2014  ? Procedure: Right/Left Heart Cath and Coronary Angiography;  Surgeon: [name], MD;  Location: [place] [facility];  Service: Cardiovascular;  Laterality: N/A;  ? CYST EXCISION    ? 2 on face  ? CYST REMOVAL NECK    ? ESOPHAGOGASTRODUODENOSCOPY (EGD) WITH PROPOFOL N/A 09/14/2014  ? Procedure: ESOPHAGOGASTRODUODENOSCOPY (EGD) WITH PROPOFOL;  Surgeon: [name], MD;  [facility];  Service: Endoscopy;  Laterality: N/A;  ? INCISION AND DRAINAGE HIP Left 01/16/2017  ? Procedure: IRRIGATION AND DEBRIDEMENT LEFT HIP;  Surgeon: [name], MD;  [facility];  Service: Orthopedics;  Laterality: Left;  ? INTRAMEDULLARY (IM) NAIL INTERTROCHANTERIC Left 11/29/2016  ? Procedure: INTRAMEDULLARY (IM) NAIL INTERTROCHANTRIC;  Surgeon: [name], MD;  Location: [place];  Service: Orthopedics;  Laterality: Left;  ? OVARY SURGERY    ? RIGHT HEART CATH N/A 08/12/2021  ? Procedure: RIGHT HEART CATH;   Surgeon: Lorretta Harp, MD;  Location: [place] [facility];  Service: Cardiovascular;  Laterality: N/A;  ? SHOULDER SURGERY Left   ? TONSILLECTOMY  age 73  ? VIDEO BRONCHOSCOPY Bilateral 05/31/2013  ? Procedure: VIDEO BRONCHOSCOPY WITHOUT FLUORO;  Surgeon: Brand Males, MD;  Location: [place];  Service: Cardiopulmonary;  Laterality: Bilateral;  ? video bronscoscopy  05/19/1998  ? lung  ? ?Past Medical History:  ?Diagnosis Date  ? Acute on chronic diastolic (congestive) heart failure ([place])   ? Anemia   ? iron deficiency anemia - secondary to blood loss ( chronic)   ? Anxiety   ? Arthritis   ? endstage changes bilateral knees/bilateral ankles.   ? Asthma   ? Carotid artery occlusion   ? Chronic  fatigue   ? Chronic kidney disease   ? Closed left hip fracture ([place])   ? Clotting disorder ([place])   ? pt denies this  ? Contusion of left knee   ? due to fall 1/14.  ? COPD (chronic obstructive pulmonary disease) ([place])   ? pulmonary fibrosis  ? Depression, reactive   ? Diabetes mellitus   ? type II   ? Diastolic dysfunction   ? Difficulty in walking   ? Family history of heart disease   ? Generalized muscle weakness   ? Gout   ? High cholesterol   ? History of falling   ? Hypertension   ? Hypothyroidism   ? Interstitial lung disease ([place])   ? Meningioma of left sphenoid wing involving cavernous sinus ([place]) 02/17/2012  ? Continue diplopia, left eye pain and left headaches.    ? Morbid obesity ([place])   ? Neuromuscular disorder ([place])   ? diabetic neuropathy   ? Normal coronary arteries   ? cardiac catheterization performed  10/31/14  ? Pneumonia   ? RA (rheumatoid arthritis) ([place])   ? has been off methotreaxte since 10/13.  ? Spinal stenosis of lumbar region   ? Thyroid disease   ? Unspecified lack of coordination   ? URI (upper respiratory infection)   ? ?BP 124/77   Pulse 94   Ht 5' 4" (1.626 m)   SpO2 (!) 87%   BMI 38.45 kg/m?  ? ?Opioid Risk Score:   ?Fall Risk Score:  `1 ? ?Depression screen PHQ 2/9 ? ? ?   08/19/2021  ? 10:55 AM 07/22/2021  ? 11:04 AM 06/21/2021  ? 10:10 AM 05/22/2021  ?  2:52 PM 03/27/2021  ? 11:53 AM 02/13/2021  ? 11:16 AM 11/26/2020  ? 11:18 AM  ?Depression screen PHQ 2/9  ?Decreased Interest 2 0 1 1 0 1 0  ?Down,

## 2021-09-17 NOTE — Progress Notes (Signed)
? ?[name] - 81 y.o. female MRN [id number]  Date of birth: 08/01/1940 ? ?Office Visit Note: ?Visit Date: 09/05/2021 ?PCP: [name], MD ?Referred by: [name], MD ? ?Subjective: ?Chief Complaint  ?Patient presents with  ? Lower Back - Pain  ? Left Leg - Pain  ? ?HPI:  [name] is a 81 y.o. female who comes in today at the request of Barnet Pall, FNP for planned Left L2-3 and L3-4 Lumbar Transforaminal epidural steroid injection with fluoroscopic guidance.  The patient has failed conservative care including home exercise, medications, time and activity modification.  This injection will be diagnostic and hopefully therapeutic.  Please see requesting physician notes for further details and justification.  ? ?ROS Otherwise per HPI. ? ?Assessment & Plan: ?Visit Diagnoses:  ?  ICD-10-CM   ?1. Lumbar radiculopathy  M54.16 XR C-ARM NO REPORT  ?  Epidural Steroid injection  ?  dexamethasone (DECADRON) injection 15 mg  ?  ?  ?Plan: No additional findings.  ? ?Meds & Orders:  ?Meds ordered this encounter  ?Medications  ? dexamethasone (DECADRON) injection 15 mg  ?  ?Orders Placed This Encounter  ?Procedures  ? XR C-ARM NO REPORT  ? Epidural Steroid injection  ?  ?Follow-up: Return if symptoms worsen or fail to improve.  ? ?Procedures: ?No procedures performed  ?Lumbosacral Transforaminal Epidural Steroid Injection - Sub-Pedicular Approach with Fluoroscopic Guidance ? ?Patient: [name]      ?Date of Birth: 1940/09/18 ?MRN: [id number] ?PCP: [name], MD      ?Visit Date: 09/05/2021 ?  ?Universal Protocol:    ?Date/Time: 09/05/2021 ? ?Consent Given By: the patient ? ?Position: PRONE ? ?Additional Comments: ?Vital signs were monitored before and after the procedure. ?Patient was prepped and draped in the usual sterile fashion. ?The correct patient, procedure, and site was verified. ? ? ?Injection Procedure Details:  ? ?Procedure diagnoses: Lumbar radiculopathy [M54.16]   ? ?Meds  Administered:  ?Meds ordered this encounter  ?Medications  ? dexamethasone (DECADRON) injection 15 mg  ? ? ?Laterality: Left ? ?Location/Site: L2 and L3 ? ?Needle:5.0 in., 22 ga.  Short bevel or Quincke spinal needle ? ?Needle Placement: Transforaminal ? ?Findings: ?  ? -Comments: Excellent flow of contrast along the nerve, nerve root and into the epidural space. ? ?Procedure Details: ?After squaring off the end-plates to get a true AP view, the C-arm was positioned so that an oblique view of the foramen as noted above was visualized. The target area is just inferior to the "nose of the scotty dog" or sub pedicular. The soft tissues overlying this structure were infiltrated with 2-3 ml. of 1% Lidocaine without Epinephrine. ? ?The spinal needle was inserted toward the target using a "trajectory" view along the fluoroscope beam.  Under AP and lateral visualization, the needle was advanced so it did not puncture dura and was located close the 6 O'Clock position of the pedical in AP tracterory. Biplanar projections were used to confirm position. Aspiration was confirmed to be negative for CSF and/or blood. A 1-2 ml. volume of Isovue-250 was injected and flow of contrast was noted at each level. Radiographs were obtained for documentation purposes.  ? ?After attaining the desired flow of contrast documented above, a 0.5 to 1.0 ml test dose of 0.25% Marcaine was injected into each respective transforaminal space.  The patient was observed for 90 seconds post injection.  After no sensory deficits were reported, and normal lower extremity motor function  was noted,   the above injectate was administered so that equal amounts of the injectate were placed at each foramen (level) into the transforaminal epidural space. ? ? ?Additional Comments:  ?The patient tolerated the procedure well ?Dressing: 2 x 2 sterile gauze and Band-Aid ?  ? ?Post-procedure details: ?Patient was observed during the procedure. ?Post-procedure  instructions were reviewed. ? ?Patient left the clinic in stable condition. ?  ? ?Clinical History: ?No specialty comments available.  ? ? ? ?Objective:  VS:  HT:    WT:   BMI:     BP:139/77  HR:(!) 101bpm  TEMP: ( )  RESP:  ?Physical Exam ?Vitals and nursing note reviewed.  ?Constitutional:   ?   General: She is not in acute distress. ?   Appearance: Normal appearance. She is not ill-appearing.  ?HENT:  ?   Head: Normocephalic and atraumatic.  ?   Right Ear: External ear normal.  ?   Left Ear: External ear normal.  ?Eyes:  ?   Extraocular Movements: Extraocular movements intact.  ?Cardiovascular:  ?   Rate and Rhythm: Normal rate.  ?   Pulses: Normal pulses.  ?Pulmonary:  ?   Effort: Pulmonary effort is normal. No respiratory distress.  ?Abdominal:  ?   General: There is no distension.  ?   Palpations: Abdomen is soft.  ?Musculoskeletal:     ?   General: Tenderness present.  ?   Cervical back: Neck supple.  ?   Right lower leg: No edema.  ?   Left lower leg: No edema.  ?   Comments: Patient has good distal strength with no pain over the greater trochanters.  No clonus or focal weakness.  ?Skin: ?   Findings: No erythema, lesion or rash.  ?Neurological:  ?   General: No focal deficit present.  ?   Mental Status: She is alert and oriented to person, place, and time.  ?   Sensory: No sensory deficit.  ?   Motor: No weakness or abnormal muscle tone.  ?   Coordination: Coordination normal.  ?Psychiatric:     ?   Mood and Affect: Mood normal.     ?   Behavior: Behavior normal.  ?  ? ?Imaging: ?No results found. ?

## 2021-09-17 NOTE — Procedures (Signed)
Lumbosacral Transforaminal Epidural Steroid Injection - Sub-Pedicular Approach with Fluoroscopic Guidance ? ?Patient: [name]      ?Date of Birth: September 06, 1940 ?MRN: [id number] ?PCP: [name], MD      ?Visit Date: 09/05/2021 ?  ?Universal Protocol:    ?Date/Time: 09/05/2021 ? ?Consent Given By: the patient ? ?Position: PRONE ? ?Additional Comments: ?Vital signs were monitored before and after the procedure. ?Patient was prepped and draped in the usual sterile fashion. ?The correct patient, procedure, and site was verified. ? ? ?Injection Procedure Details:  ? ?Procedure diagnoses: Lumbar radiculopathy [M54.16]   ? ?Meds Administered:  ?Meds ordered this encounter  ?Medications  ? dexamethasone (DECADRON) injection 15 mg  ? ? ?Laterality: Left ? ?Location/Site: L2 and L3 ? ?Needle:5.0 in., 22 ga.  Short bevel or Quincke spinal needle ? ?Needle Placement: Transforaminal ? ?Findings: ?  ? -Comments: Excellent flow of contrast along the nerve, nerve root and into the epidural space. ? ?Procedure Details: ?After squaring off the end-plates to get a true AP view, the C-arm was positioned so that an oblique view of the foramen as noted above was visualized. The target area is just inferior to the "nose of the scotty dog" or sub pedicular. The soft tissues overlying this structure were infiltrated with 2-3 ml. of 1% Lidocaine without Epinephrine. ? ?The spinal needle was inserted toward the target using a "trajectory" view along the fluoroscope beam.  Under AP and lateral visualization, the needle was advanced so it did not puncture dura and was located close the 6 O'Clock position of the pedical in AP tracterory. Biplanar projections were used to confirm position. Aspiration was confirmed to be negative for CSF and/or blood. A 1-2 ml. volume of Isovue-250 was injected and flow of contrast was noted at each level. Radiographs were obtained for documentation purposes.  ? ?After attaining the desired flow of  contrast documented above, a 0.5 to 1.0 ml test dose of 0.25% Marcaine was injected into each respective transforaminal space.  The patient was observed for 90 seconds post injection.  After no sensory deficits were reported, and normal lower extremity motor function was noted,   the above injectate was administered so that equal amounts of the injectate were placed at each foramen (level) into the transforaminal epidural space. ? ? ?Additional Comments:  ?The patient tolerated the procedure well ?Dressing: 2 x 2 sterile gauze and Band-Aid ?  ? ?Post-procedure details: ?Patient was observed during the procedure. ?Post-procedure instructions were reviewed. ? ?Patient left the clinic in stable condition. ? ?

## 2021-09-18 MED ORDER — OFEV 100 MG PO CAPS: 100.0000 mg | ORAL_CAPSULE | Freq: Two times a day (BID) | ORAL | 1 refills | Status: DC

## 2021-09-18 NOTE — Telephone Encounter (Signed)
Pt has [employer] insurance and will need to fill through their spec pharmacy. Routing to [facility] for f/u ?

## 2021-09-18 NOTE — Telephone Encounter (Signed)
Rx for Ofev 100 mg twice daily sent to [facility]. Pharmacy usually takes 2 weeks to send medication to patient's home. ? ?Patient counseled on purpose, proper use, and potential adverse effects including diarrhea, nausea, vomiting, abdominal pain, decreased appetite, weight loss, and increased blood pressure. Stressed the importance of routine lab monitoring. Will monitor LFT's every month for the first 6 months of treatment then every 3 months. Will monitor CBC every 3 months. [name] reviewed with her in detail at [facility]. She has constipation at baseline is not concerned about diarrhea but I did recommend she keep Imodium on hand if she has any diarrhea episodes. ? ?She understands that it may take additional 2 weeks to get medication from pharmacy via mail. ? ?She has f/u with Derl Barrow, NP, on 10/21/21 - will likely be too early to have labs completed at that visit ? ?Knox Saliva, PharmD, MPH, BCPS, CPP ?Clinical Pharmacist (Rheumatology and Pulmonology) ?

## 2021-09-19 ENCOUNTER — Encounter: Payer: Medicare Other | Admitting: Rehabilitative and Restorative Service Providers"

## 2021-09-25 ENCOUNTER — Telehealth: Payer: Self-pay | Admitting: Family Medicine

## 2021-09-25 ENCOUNTER — Encounter: Payer: Self-pay | Admitting: Rehabilitative and Restorative Service Providers"

## 2021-09-25 ENCOUNTER — Ambulatory Visit (INDEPENDENT_AMBULATORY_CARE_PROVIDER_SITE_OTHER): Payer: Medicare Other | Admitting: Family Medicine

## 2021-09-25 ENCOUNTER — Encounter: Payer: Self-pay | Admitting: Family Medicine

## 2021-09-25 ENCOUNTER — Ambulatory Visit (INDEPENDENT_AMBULATORY_CARE_PROVIDER_SITE_OTHER): Payer: Medicare Other | Admitting: Rehabilitative and Restorative Service Providers"

## 2021-09-25 VITALS — BP 136/82 | HR 91 | Temp 98.9°F | Wt 227.4 lb

## 2021-09-25 DIAGNOSIS — I5032 Chronic diastolic (congestive) heart failure: Secondary | ICD-10-CM

## 2021-09-25 DIAGNOSIS — R600 Localized edema: Secondary | ICD-10-CM

## 2021-09-25 DIAGNOSIS — G8929 Other chronic pain: Secondary | ICD-10-CM | POA: Diagnosis not present

## 2021-09-25 DIAGNOSIS — R293 Abnormal posture: Secondary | ICD-10-CM

## 2021-09-25 DIAGNOSIS — M6281 Muscle weakness (generalized): Secondary | ICD-10-CM

## 2021-09-25 DIAGNOSIS — E039 Hypothyroidism, unspecified: Secondary | ICD-10-CM

## 2021-09-25 DIAGNOSIS — M79661 Pain in right lower leg: Secondary | ICD-10-CM | POA: Diagnosis not present

## 2021-09-25 DIAGNOSIS — K219 Gastro-esophageal reflux disease without esophagitis: Secondary | ICD-10-CM

## 2021-09-25 DIAGNOSIS — M25552 Pain in left hip: Secondary | ICD-10-CM

## 2021-09-25 DIAGNOSIS — M5442 Lumbago with sciatica, left side: Secondary | ICD-10-CM

## 2021-09-25 DIAGNOSIS — R262 Difficulty in walking, not elsewhere classified: Secondary | ICD-10-CM | POA: Diagnosis not present

## 2021-09-25 DIAGNOSIS — J849 Interstitial pulmonary disease, unspecified: Secondary | ICD-10-CM

## 2021-09-25 DIAGNOSIS — M5459 Other low back pain: Secondary | ICD-10-CM

## 2021-09-25 LAB — TOXASSURE SELECT,+ANTIDEPR,UR

## 2021-09-25 MED ORDER — LEVOTHYROXINE SODIUM 50 MCG PO TABS: 50.0000 ug | ORAL_TABLET | Freq: Every day | ORAL | 3 refills | Status: DC

## 2021-09-25 MED ORDER — PANTOPRAZOLE SODIUM 40 MG PO TBEC: DELAYED_RELEASE_TABLET | ORAL | 3 refills | Status: DC

## 2021-09-25 NOTE — Progress Notes (Signed)
Subjective:  ? ? Patient ID: [name], female    DOB: 02/25/1941, 81 y.o.   MRN: [id number] ? ?Chief Complaint  ?Patient presents with  ? Edema  ?  RLE swelling more, lasix is not helping. Has been swollen for 3 days, states it is very painful.  ?Patient accompanied by her daughter, [name]. ? ?HPI ?Patient is a 81 yo female with pmh sig for chronic diastolic CHF, history of DVT, HTN, pulmonary HTN, asthma, COPD, ILD, postinflammatory pulmonary fibrosis, chronic O2 therapy, chronic back pain 2/2 spinal stenosis of lumbar region with radiculopathy, GERD, hypothyroidism, DM2 with neuropathy, meningioma of left sphenoid wing, arthritis in bilateral knees and ankles, RA, CKD 3, HLD, obesity who was seen today for acute concern.  Pt with increased LE edema, RLE>LLE and pain in RLE.  On lasix 80 mg daily without improvement.  Denies SOB.  Pt mentions R ankle instability.  States in the past had to go to podiatry for brace/boot to support ankle.  In PT.  Doing ankle strengthening exercises at home. ? ?Requesting refill on levothyroxine and pantoprazole. ? ?Past Medical History:  ?Diagnosis Date  ? Acute on chronic diastolic (congestive) heart failure ([place])   ? Anemia   ? iron deficiency anemia - secondary to blood loss ( chronic)   ? Anxiety   ? Arthritis   ? endstage changes bilateral knees/bilateral ankles.   ? Asthma   ? Carotid artery occlusion   ? Chronic fatigue   ? Chronic kidney disease   ? Closed left hip fracture ([place])   ? Clotting disorder ([place])   ? pt denies this  ? Contusion of left knee   ? due to fall 1/14.  ? COPD (chronic obstructive pulmonary disease) ([place])   ? pulmonary fibrosis  ? Depression, reactive   ? Diabetes mellitus   ? type II   ? Diastolic dysfunction   ? Difficulty in walking   ? Family history of heart disease   ? Generalized muscle weakness   ? Gout   ? High cholesterol   ? History of falling   ? Hypertension   ? Hypothyroidism   ? Interstitial lung disease ([place])   ? Meningioma of left  sphenoid wing involving cavernous sinus ([place]) 02/17/2012  ? Continue diplopia, left eye pain and left headaches.    ? Morbid obesity ([place])   ? Neuromuscular disorder ([place])   ? diabetic neuropathy   ? Normal coronary arteries   ? cardiac catheterization performed  10/31/14  ? Pneumonia   ? RA (rheumatoid arthritis) ([place])   ? has been off methotreaxte since 10/13.  ? Spinal stenosis of lumbar region   ? Thyroid disease   ? Unspecified lack of coordination   ? URI (upper respiratory infection)   ? ? ?Allergies  ?Allergen Reactions  ? Codeine Swelling and Other (See Comments)  ?  Facial swelling ?Chest pain and swelling in legs ? ?  ? Infliximab Anaphylaxis  ?  ([place]) "sent me into shock" ?  ? Lisinopril Swelling  ?  Face and neck swelling ?  ? ? ?ROS ?General: Denies fever, chills, night sweats, changes in weight, changes in appetite ?HEENT: Denies headaches, ear pain, changes in vision, rhinorrhea, sore throat ?CV: Denies CP, palpitations, SOB, orthopnea  +LE edema RLE>LLE. ?Pulm: Denies SOB, cough, wheezing ?GI: Denies abdominal pain, nausea, vomiting, diarrhea, constipation ?GU: Denies dysuria, hematuria, frequency, vaginal discharge ?Msk: Denies muscle cramps, joint pains  +RLE pain, R ankle pain, R knee  pain ?Neuro: Denies weakness, numbness, tingling ?Skin: Denies rashes, bruising ?Psych: Denies depression, anxiety, hallucinations ? ?   ?Objective:  ?  ?Blood pressure 136/82, pulse 91, temperature 98.9 ?F (37.2 ?C), temperature source Oral, weight 227 lb 6.4 oz (103.1 kg), SpO2 96 %. ? ?Gen. Pleasant, well-nourished, in no distress, normal affect, on 3L via [place]  ?HEENT: [place]/AT, face symmetric, conjunctiva clear, no scleral icterus, PERRLA, EOMI, nares patent without drainage. ?Lungs: no accessory muscle use, CTAB, no wheezes or rales ?Cardiovascular: RRR, no m/r/g, RLE>>LLE in size.  1+ pitting edema in RLE.  Trace in LLE.  TTP of R calf and thigh. ?Musculoskeletal: No deformities, no cyanosis or clubbing,  normal tone.  Negative Homans' sign b/l. ?Neuro:  A&Ox3, CN II-XII intact, gait not assessed as sitting in transport wheelchair. ?Skin:  Warm, no lesions/ rash.  Venous stasis changes in b/l LEs.  No increased warmth in bilateral LEs or erythema. ? ? ?Wt Readings from Last 3 Encounters:  ?09/13/21 224 lb (101.6 kg)  ?08/12/21 223 lb (101.2 kg)  ?08/02/21 225 lb (102.1 kg)  ? ? ?Lab Results  ?Component Value Date  ? WBC 8.4 05/22/2021  ? HGB 12.6 08/12/2021  ? HGB 12.6 08/12/2021  ? HCT 37.0 08/12/2021  ? HCT 37.0 08/12/2021  ? PLT 195.0 05/22/2021  ? GLUCOSE 94 05/22/2021  ? CHOL 136 09/26/2020  ? TRIG 283.0 (H) 09/26/2020  ? HDL 40.00 09/26/2020  ? LDLDIRECT 59.0 09/26/2020  ? [place] 75 09/22/2019  ? ALT 18 05/22/2021  ? AST 16 05/22/2021  ? NA 146 (H) 08/12/2021  ? NA 145 08/12/2021  ? K 3.2 (L) 08/12/2021  ? K 3.2 (L) 08/12/2021  ? CL 103 05/22/2021  ? CREATININE 0.82 05/22/2021  ? BUN 15 05/22/2021  ? CO2 32 05/22/2021  ? TSH 4.598 (H) 01/16/2021  ? INR 1.0 03/30/2021  ? HGBA1C 6.5 (H) 04/02/2021  ? ? ?Assessment/Plan: ? ?Lower extremity edema  ?-concern for DVT given prior hx.  Also consider acute on chronic CHF exacerbation vs venous stasis due to PVD. Arthritis in knees and ankles may also be contributing to symptoms. ?-currently taking ASA 325 mg daily  ?-LE doppler u/s to r/o DVT. ?-Obtain labs ?-continue lasix 80 mg daily and metolazone prn. ?-Discussed elevating LEs when sitting.  Can also use Ace bandages to wrap LEs as unable to get on compression socks or TED hose. ?-Continue lifestyle modifications ?-Given strict precautions ?- Plan: VAS [place] LOWER EXTREMITY VENOUS (DVT), Brain Natriuretic Peptide, CBC with Differential/Platelet, CMP, TSH ? ?Pain in right lower leg  ?-Discussed various causes including DVT, venous stasis, also consider arthritis ?-Supportive care ?-Further recommendations based on pending studies and results ?- Plan: VAS [place] LOWER EXTREMITY VENOUS (DVT), Brain Natriuretic Peptide, CBC  with Differential/Platelet ? ?ILD (interstitial lung disease) ([place]) ?-continue 3L O2 via [place] ?-continue current medications including albuterol inhaler, albuterol nebulizer solution as needed, Ofev 100 mg BID ?-continue f/u with Pulmonology locally and at [facility]. ? ?Chronic diastolic CHF (congestive heart failure) ([place]) ?-Stable ?-Continue lifestyle modifications ?-Continue monitoring daily weights ?-Continue current medications including Lasix 80 mg daily, metolazone 2.5 mg as needed, Toprol-XL 125 mg, olmesartan 40 mg daily ?-Continue follow-up with cardiology ?-Plan: BNP ? ?Acquired hypothyroidism ?-Continue Synthroid 50 mcg daily ?-Plan: TSH, Synthroid 50 mcg ? ?GERD ?-Plan: Pantoprazole 40 mg daily ? ?F/u prn for continued or worsened symptoms ? ?Grier Mitts, MD ?

## 2021-09-25 NOTE — Telephone Encounter (Signed)
Pt is calling and would like a refill on pantoprazole (PROTONIX) 40 MG tablet and levothyroxine (SYNTHROID) 50 MCG tablet # 90 each these med was prescribed by her old md  ?[facility] [place], [place] [address] Phone:  [phone]  ?Fax:  [phone]  ?  ? ?

## 2021-09-25 NOTE — Therapy (Signed)
?OUTPATIENT PHYSICAL THERAPY TREATMENT NOTE ? ? ?Patient Name: [name] ?MRN: [id number] ?DOB:[birth year], 81 y.o., female ?Today's Date: 09/25/2021 ? ?PCP: [name], MD ?REFERRING PROVIDER: [name], MD ? ?END OF SESSION:  ? PT End of Session - 09/25/21 1143   ? ? Visit Number 6   ? Number of Visits 20   ? Date for PT Re-Evaluation 10/25/21   ? Authorization Type Medicare and [place] [facility]   ? Progress Note Due on Visit 10   ? PT Start Time 1100   ? PT Stop Time 1145   ? PT Time Calculation (min) 45 min   ? Activity Tolerance Patient tolerated treatment well   ? Behavior During Therapy [facility] for tasks assessed/performed   ? ?  ?  ? ?  ? ? ? ? ? ? ?Past Medical History:  ?Diagnosis Date  ? Acute on chronic diastolic (congestive) heart failure ([place])   ? Anemia   ? iron deficiency anemia - secondary to blood loss ( chronic)   ? Anxiety   ? Arthritis   ? endstage changes bilateral knees/bilateral ankles.   ? Asthma   ? Carotid artery occlusion   ? Chronic fatigue   ? Chronic kidney disease   ? Closed left hip fracture ([place])   ? Clotting disorder ([place])   ? pt denies this  ? Contusion of left knee   ? due to fall 1/14.  ? COPD (chronic obstructive pulmonary disease) ([place])   ? pulmonary fibrosis  ? Depression, reactive   ? Diabetes mellitus   ? type II   ? Diastolic dysfunction   ? Difficulty in walking   ? Family history of heart disease   ? Generalized muscle weakness   ? Gout   ? High cholesterol   ? History of falling   ? Hypertension   ? Hypothyroidism   ? Interstitial lung disease ([place])   ? Meningioma of left sphenoid wing involving cavernous sinus ([place]) 02/17/2012  ? Continue diplopia, left eye pain and left headaches.    ? Morbid obesity ([place])   ? Neuromuscular disorder ([place])   ? diabetic neuropathy   ? Normal coronary arteries   ? cardiac catheterization performed  10/31/14  ? Pneumonia   ? RA (rheumatoid arthritis) ([place])   ? has been off methotreaxte since 10/13.  ? Spinal stenosis of lumbar region    ? Thyroid disease   ? Unspecified lack of coordination   ? URI (upper respiratory infection)   ? ?Past Surgical History:  ?Procedure Laterality Date  ? ABDOMINAL HYSTERECTOMY    ? BRAIN SURGERY    ? Gamma knife 10/13. Needs repeat spring  '14  ? CARDIAC CATHETERIZATION N/A 10/31/2014  ? Procedure: Right/Left Heart Cath and Coronary Angiography;  Surgeon: [name], MD;  Location: [facility];  Service: Cardiovascular;  Laterality: N/A;  ? CYST EXCISION    ? 2 on face  ? CYST REMOVAL NECK    ? ESOPHAGOGASTRODUODENOSCOPY (EGD) WITH PROPOFOL N/A 09/14/2014  ? Procedure: ESOPHAGOGASTRODUODENOSCOPY (EGD) WITH PROPOFOL;  Surgeon: [name], MD;  Location: [facility];  Service: Endoscopy;  Laterality: N/A;  ? INCISION AND DRAINAGE HIP Left 01/16/2017  ? Procedure: IRRIGATION AND DEBRIDEMENT LEFT HIP;  Surgeon: [name], MD;  Location: [facility];  Service: Orthopedics;  Laterality: Left;  ? INTRAMEDULLARY (IM) NAIL INTERTROCHANTERIC Left 11/29/2016  ? Procedure: INTRAMEDULLARY (IM) NAIL INTERTROCHANTRIC;  Surgeon: [name], MD;  Location: [facility]  OR;  Service: Orthopedics;  Laterality: Left;  ? OVARY SURGERY    ? RIGHT HEART CATH N/A 08/12/2021  ? Procedure: RIGHT HEART CATH;  Surgeon: Lorretta Harp, MD;  Location: [facility];  Service: Cardiovascular;  Laterality: N/A;  ? SHOULDER SURGERY Left   ? TONSILLECTOMY  age 23  ? VIDEO BRONCHOSCOPY Bilateral 05/31/2013  ? Procedure: VIDEO BRONCHOSCOPY WITHOUT FLUORO;  Surgeon: Brand Males, MD;  Location: [facility];  Service: Cardiopulmonary;  Laterality: Bilateral;  ? video bronscoscopy  05/19/1998  ? lung  ? ?Patient Active Problem List  ? Diagnosis Date Noted  ? Pulmonary HTN ([place])   ? Unspecified protein-calorie malnutrition ([place]) 04/05/2021  ? Pressure injury of skin 03/31/2021  ? UTI (urinary tract infection) 03/31/2021  ? Sepsis due to urinary tract infection ([place]) 03/30/2021  ? Acute encephalopathy  03/30/2021  ? Thrombocytopenia ([place]) 03/30/2021  ? Diabetic neuropathy ([place]) 05/24/2019  ? Hypokalemia 04/21/2019  ? Anticoagulated 06/25/2018  ? Chronic pain 06/25/2018  ? CRI (chronic renal insufficiency), stage 3 (moderate) ([place]) 06/25/2018  ? Drug-induced constipation 06/18/2018  ? DVT (deep venous thrombosis) ([place]) 06/03/2018  ? Primary osteoarthritis of both knees 05/26/2018  ? Body mass index (BMI) of 50-59.9 in adult [facility]) 05/26/2018  ? Acquired hypothyroidism 05/26/2018  ? Chronic bilateral low back pain without sciatica 02/17/2018  ? Astigmatism with presbyopia, bilateral 03/26/2017  ? Cortical age-related cataract of both eyes 03/26/2017  ? Family history of glaucoma 03/26/2017  ? Long term current use of oral hypoglycemic drug 03/26/2017  ? Nuclear sclerotic cataract of both eyes 03/26/2017  ? Pain in left hip 02/18/2017  ? Closed nondisplaced intertrochanteric fracture of left femur with delayed healing 01/14/2017  ? Gout 11/29/2016  ? Depression 11/29/2016  ? Closed left hip fracture ([place]) 11/29/2016  ? GERD (gastroesophageal reflux disease) 11/29/2016  ? Closed intertrochanteric fracture of hip, left, initial encounter ([place]) 11/29/2016  ? Physical deconditioning 07/31/2015  ? Pulmonary fibrosis, postinflammatory ([place]) 07/31/2015  ? Bronchiectasis without complication ([place]) 03/70/9643  ? Tendinitis of left rotator cuff 06/13/2015  ? Chronic diastolic CHF (congestive heart failure) ([place]) 04/21/2015  ? Acute kidney injury superimposed on chronic kidney disease ([place]) 04/17/2015  ? Chronic respiratory failure with hypoxia ([place]) 02/09/2015  ? Dyspnea and respiratory abnormality 01/03/2015  ? Normal coronary arteries 11/02/2014  ? Morbid obesity ([place]) 10/30/2014  ? Dysphagia 08/21/2014  ? Chronic fatigue 06/21/2014  ? DOE (dyspnea on exertion) 05/24/2014  ? Primary osteoarthritis of left knee 04/26/2014  ? High risk medication use 04/17/2014  ? Aphasia 02/12/2014  ? Type 2 diabetes mellitus with sensory  neuropathy ([place]) 01/18/2014  ? Lumbar facet arthropathy 01/18/2014  ? Bilateral edema of lower extremity 05/30/2013  ? Chronic cough 05/24/2013  ? ILD (interstitial lung disease) ([place]) 04/10/2013  ? Spinal stenosis of lumbar region with radiculopathy 07/12/2012  ? Inability to walk 07/12/2012  ? Meningioma of left sphenoid wing involving cavernous sinus ([place]) 02/17/2012  ? Chest pain of uncertain etiology 83/81/8403  ? Rheumatoid arthritis ([place]) 01/28/2012  ? Primary osteoarthritis of right knee 01/28/2012  ? Dyslipidemia   ? Thyroid disease   ? Essential hypertension   ? ? ?REFERRING PROVIDER: [name], MD ?  ?REFERRING DIAG: M54.50,G89.29 (ICD-10-CM) - Chronic bilateral low back pain without sciatica ?M25.552 (ICD-10-CM) - Pain in left hip ? ?ONSET DATE:  Dec 2022 ? ?THERAPY DIAG:  ?Other low back pain ? ?Pain in left hip ? ?Muscle weakness (generalized) ? ?Difficulty in walking,  not elsewhere classified ? ?Abnormal posture ? ?Chronic left-sided low back pain with left-sided sciatica ? ?PERTINENT HISTORY: HTN, hypothyroidism, history of RA, COPD, DM, high cholesterol, CHF.  ORIF Lt femur from hip fracture "about 4-5 years ago" ? ?PRECAUTIONS: May require supplemental oxygen ? ?SUBJECTIVE: Ariaunna reports she is getting an injection tomorrow.  Pain can be 7/10 but is a 3-4/10 today with pain to the foot (edema). ? ?PAIN:  ?Are you having pain? Yes:  ?NPRS scale: Worst today in back 4/10 ?Pain location: Back pain, Lt leg pain to knee (foot with edema) ?Pain description: Burning, sharp, shooting ?Aggravating factors: Worse in morning ?Relieving factors: Medicine ? ?OBJECTIVE:  ?  ?PATIENT SURVEYS:  ?09/12/2021: update 43% ?08/16/2021 FOTO intake: 33 predicted:  37 ?  ?SCREENING FOR RED FLAGS: ?08/16/2021 ?Bowel or bladder incontinence: No ?Cauda equina syndrome: No ?  ?  ?COGNITION: ?           08/16/2021 Overall cognitive status: Within functional limits for tasks assessed                     ?             ?SENSATION: ?08/16/2021 Light touch: WFL ?  ?MUSCLE LENGTH: ?08/16/2021:  none tested today ?  ?POSTURE:  ?08/16/2021 : rounded shoulders, increased thoracic kyphosis, reduced lumbar lordosis in standing, forward trunk l

## 2021-09-26 ENCOUNTER — Ambulatory Visit (INDEPENDENT_AMBULATORY_CARE_PROVIDER_SITE_OTHER): Payer: Medicare Other | Admitting: Physical Medicine and Rehabilitation

## 2021-09-26 ENCOUNTER — Ambulatory Visit (HOSPITAL_COMMUNITY)
Admission: RE | Admit: 2021-09-26 | Discharge: 2021-09-26 | Disposition: A | Discharge status: Home / Self Care | Payer: Medicare Other | Source: Ambulatory Visit | Attending: Family Medicine | Admitting: Family Medicine

## 2021-09-26 ENCOUNTER — Telehealth: Payer: Self-pay | Admitting: *Deleted

## 2021-09-26 ENCOUNTER — Encounter: Payer: Self-pay | Admitting: Physical Medicine and Rehabilitation

## 2021-09-26 VITALS — BP 155/94 | HR 167

## 2021-09-26 DIAGNOSIS — M47816 Spondylosis without myelopathy or radiculopathy, lumbar region: Secondary | ICD-10-CM | POA: Diagnosis not present

## 2021-09-26 DIAGNOSIS — M5116 Intervertebral disc disorders with radiculopathy, lumbar region: Secondary | ICD-10-CM

## 2021-09-26 DIAGNOSIS — R269 Unspecified abnormalities of gait and mobility: Secondary | ICD-10-CM | POA: Diagnosis not present

## 2021-09-26 DIAGNOSIS — M5416 Radiculopathy, lumbar region: Secondary | ICD-10-CM

## 2021-09-26 DIAGNOSIS — M48062 Spinal stenosis, lumbar region with neurogenic claudication: Secondary | ICD-10-CM

## 2021-09-26 DIAGNOSIS — M79661 Pain in right lower leg: Secondary | ICD-10-CM | POA: Insufficient documentation

## 2021-09-26 DIAGNOSIS — R600 Localized edema: Secondary | ICD-10-CM | POA: Insufficient documentation

## 2021-09-26 LAB — COMPREHENSIVE METABOLIC PANEL
ALT: 24 U/L (ref 0–35)
AST: 15 U/L (ref 0–37)
Albumin: 4 g/dL (ref 3.5–5.2)
Alkaline Phosphatase: 81 U/L (ref 39–117)
BUN: 17 mg/dL (ref 6–23)
CO2: 34 mEq/L — ABNORMAL HIGH (ref 19–32)
Calcium: 8.9 mg/dL (ref 8.4–10.5)
Chloride: 103 mEq/L (ref 96–112)
Creatinine, Ser: 0.87 mg/dL (ref 0.40–1.20)
GFR: 63.18 mL/min (ref >60.00)
Glucose, Bld: 123 mg/dL — ABNORMAL HIGH (ref 70–99)
Potassium: 4 mEq/L (ref 3.5–5.1)
Sodium: 144 mEq/L (ref 135–145)
Total Bilirubin: 0.5 mg/dL (ref 0.2–1.2)
Total Protein: 6.4 g/dL (ref 6.0–8.3)

## 2021-09-26 LAB — TSH: TSH: 1.33 u[IU]/mL (ref 0.35–5.50)

## 2021-09-26 LAB — CBC WITH DIFFERENTIAL/PLATELET
Basophils Absolute: 0.1 10*3/uL (ref 0.0–0.1)
Basophils Relative: 1.4 % (ref 0.0–3.0)
Eosinophils Absolute: 0.1 10*3/uL (ref 0.0–0.7)
Eosinophils Relative: 1.2 % (ref 0.0–5.0)
HCT: 37.3 % (ref 36.0–46.0)
Hemoglobin: 11.9 g/dL — ABNORMAL LOW (ref 12.0–15.0)
Lymphocytes Relative: 17.4 % (ref 12.0–46.0)
Lymphs Abs: 1.3 10*3/uL (ref 0.7–4.0)
MCHC: 31.9 g/dL (ref 30.0–36.0)
MCV: 96.2 fl (ref 78.0–100.0)
Monocytes Absolute: 0.3 10*3/uL (ref 0.1–1.0)
Monocytes Relative: 4.5 % (ref 3.0–12.0)
Neutro Abs: 5.8 10*3/uL (ref 1.4–7.7)
Neutrophils Relative %: 75.5 % (ref 43.0–77.0)
Platelets: 153 10*3/uL (ref 150.0–400.0)
RBC: 3.87 Mil/uL (ref 3.87–5.11)
RDW: 15.3 % (ref 11.5–15.5)
WBC: 7.7 10*3/uL (ref 4.0–10.5)

## 2021-09-26 LAB — BRAIN NATRIURETIC PEPTIDE: Pro B Natriuretic peptide (BNP): 10 pg/mL (ref 0.0–100.0)

## 2021-09-26 NOTE — Progress Notes (Signed)
? ?Shuronda ETTY ISAAC - 81 y.o. female MRN [id number]  Date of birth: 07/15/1940 ? ?Office Visit Note: ?Visit Date: 09/26/2021 ?PCP: [name], MD ?Referred by: [name], MD ? ?Subjective: ?Chief Complaint  ?Patient presents with  ? Lower Back - Pain  ? ?HPI: Genese F Sak is a 81 y.o. female who comes in today for evaluation of chronic, worsening and severe bilateral lower back pain radiating to legs. Patient reports pain has been ongoing for several years and is exacerbated by standing, walking and activity. She describes pain as sore, aching and tingling sensation, currently rates as 8 out of 10. Patient reports some relief of pain with rest and use of medications. She is currently attending formal physical therapy with our in house team and reports some relief of pain with these treatments. Patient is currently taking 15 mg Morphine every 12 hours that is prescribed by Danella Sensing, NP at [facility]. Patients lumbar MRI from 2021 exhibits multi-level facet hypertrophy, moderate spinal canal stenosis at L1-L2 and L2-L3, moderate to severe at L3-L4 and severe at L4-L5. There is also large extruded disc fragment within the left L2 lateral recess resulting in left sided nerve encroachment. Patient had left L2 and L3 transforaminal epidural steroid injection performed in our office on 09/05/2021 and reports greater than 50% pain relief that continues to sustain. Patient states she now has increased functionality, however she continue to have pain. Patient denies focal weakness. Patient denies recent trauma or falls.  ? ?Review of Systems  ?Musculoskeletal:  Positive for back pain.  ?Neurological:  Positive for tingling. Negative for focal weakness and weakness.  ?All other systems reviewed and are negative. Otherwise per HPI. ? ?Assessment & Plan: ?Visit Diagnoses:  ?  ICD-10-CM   ?1. Lumbar radiculopathy  M54.16 Ambulatory referral to Physical Medicine Rehab  ?  ?2.  Radiculopathy due to lumbar intervertebral disc disorder  M51.16 Ambulatory referral to Physical Medicine Rehab  ?  ?3. Spinal stenosis of lumbar region with neurogenic claudication  M48.062 Ambulatory referral to Physical Medicine Rehab  ?  ?4. Facet hypertrophy of lumbar region  M47.816 Ambulatory referral to Physical Medicine Rehab  ?  ?5. Gait abnormality  R26.9 Ambulatory referral to Physical Medicine Rehab  ?  ?   ?Plan: Findings:  ?Chronic, worsening and severe bilateral lower back pain radiating down both legs.  Patient continues to have severe pain despite good conservative therapies such as formal physical therapy, rest and use of medications.  Patient's clinical presentation and exam are consistent with neurogenic claudication as a result of spinal canal stenosis.  Patient does have spinal canal stenosis noted at multiple levels of her lumbar spine, most severe at the level of L4-L5.  Previous lumbar MRI from 2021 does exhibit large extruded disc fragment within the left L2 lateral recess. We do not feel this is a source of pain at this time as the disc fragment was possibly reabsorbed over time.  We believe the next step is to perform a diagnostic and hopefully therapeutic bilateral L4 transforaminal epidural steroid injection under fluoroscopic guidance.  Patient has no questions at this time regarding lumbar epidural steroid injection procedure.  Patient encouraged to remain active and to continue using rolling walker to assist with ambulation and prevent falls.  No red flag symptoms noted upon exam today.  ? ?Meds & Orders: No orders of the defined types were placed in this encounter. ?  ?Orders Placed This Encounter  ?  Procedures  ? Ambulatory referral to Physical Medicine Rehab  ?  ?Follow-up: Return for Bilateral L4 transforaminal epidural steroid injection.  ? ?Procedures: ?No procedures performed  ?   ? ?Clinical History: ?EXAM: ?MRI LUMBAR SPINE WITHOUT CONTRAST ?  ?TECHNIQUE: ?Multiplanar,  multisequence MR imaging of the lumbar spine was ?performed. No intravenous contrast was administered. ?  ?COMPARISON:  Radiographs 11/11/2019.  MRI 07/12/2012. ?  ?FINDINGS: ?Segmentation: Conventional anatomy assumed, with the last open disc ?space designated L5-S1.Concordant with previous imaging. ?  ?Alignment: Mildly progressive degenerative anterolisthesis at L4-5, ?measuring approximately 4 mm. Otherwise normal. ?  ?Vertebrae: No worrisome osseous lesion, acute fracture or pars ?defect. The lumbar pedicles are short on a congenital basis. There ?are scattered endplate degenerative changes. ?  ?Conus medullaris: Extends to the L1 level and appears normal. ?  ?Paraspinal and other soft tissues: There is some atrophy of the ?erector spinae musculature with nonspecific dependent subcutaneous ?edema. No significant anterior paraspinal abnormalities. ?  ?Disc levels: ?  ?T12-L1: Mildly progressive disc bulging eccentric to the left ?without resulting spinal stenosis or nerve root encroachment. Mild ?bilateral facet hypertrophy. ?  ?L1-2: Progressive loss of disc height with progressive annular disc ?bulging eccentric to the left. There is a large extruded disc ?fragment within the left L2 lateral recess extending from the L1-2 ?disc to the L2-3 disc, favored to arise from the L2-3 level, further ?described below. There is moderate facet and ligamentous hypertrophy ?with bilateral facet joint effusions. At the level of the L1-2 disc, ?there is moderate spinal stenosis with mild asymmetric narrowing of ?the left lateral recess and left foramen. ?  ?L2-3: Progressive loss of disc height with annular disc bulging and ?anterior endplate degenerative changes. As above, there is a large ?extruded disc fragment within the left L2 lateral recess, extending ?from the L1-2 disc to the L2-3 disc, favored to arise from the L2-3 ?level. This exerts significant mass effect on the thecal sac and ?likely results in left-sided nerve  root encroachment. There is ?progressive moderate facet and ligamentous hypertrophy with ?bilateral facet joint effusions. At the level of the disc, there is ?moderate foraminal narrowing bilaterally. ?  ?L3-4: Mildly progressive loss of disc height with annular disc ?bulging. Progressive facet and ligamentous hypertrophy with ?bilateral facet joint effusions. Progressive multifactorial spinal ?stenosis, now moderate to severe. The foramina appear only mildly ?narrowed without exiting L3 nerve root encroachment. ?  ?L4-5: Similar loss of disc height with annular disc bulging and ?uncovering related to the degenerative anterolisthesis. There is ?advanced bilateral facet hypertrophy. These factors contribute to ?severe multifactorial spinal stenosis, similar to the previous ?study. There is moderate narrowing of both lateral recesses. There ?is mild foraminal narrowing, right greater than left. ?  ?L5-S1: Disc height and hydration are relatively maintained. The ?right facet joint appears ankylosed. No significant spinal stenosis ?or nerve root encroachment. ?  ?IMPRESSION: ?1. Large extruded disc fragment within the left L2 lateral recess ?extending from the L1-2 disc to the L2-3 disc, favored to arise from ?the L2-3 level. This exerts significant mass effect on the thecal ?sac and likely results in left-sided nerve root encroachment. ?2. Progressive multilevel spondylosis superimposed on a congenitally ?small spinal canal. There is resulting moderate spinal stenosis at ?L1-2 and L2-3, moderate to severe spinal stenosis at L3-4 and severe ?spinal stenosis at L4-5. ?3. Progressive facet disease as described. No acute osseous ?findings. ?  ?  ?Electronically Signed ?  By: Richardean Sale M.D. ?  On: 12/28/2019 13:45  ? ?  She reports that she has never smoked. She has never used smokeless tobacco.  ?Recent Labs  ?  04/02/21 ?3545  ?HGBA1C 6.5*  ? ? ?Objective:  VS:  HT:    WT:   BMI:     BP: ?(!) 155/94  HR: ?(!)  167bpm  TEMP: ( )  RESP:  ?Physical Exam ?Vitals and nursing note reviewed.  ?HENT:  ?   Head: Normocephalic and atraumatic.  ?   Right Ear: External ear normal.  ?   Left Ear: External ear normal.  ?   Nose: Nose no

## 2021-09-26 NOTE — Progress Notes (Signed)
Numeric Pain Rating Scale and Functional Assessment ?Average Pain 4 ? ? ?In the last MONTH (on 0-10 scale) has pain interfered with the following? ? ?1. General activity like being  able to carry out your everyday physical activities such as walking, climbing stairs, carrying groceries, or moving a chair?  ?Rating(7) ? ?Patient comes in today for follow up after injection. States injection helped some. Ambulates with rolling walker. She is still having tingling, numbness and burning. Takes Morphine and Tylenol. This is prescribed by pain clinic. Pain radiates down left leg. Pain comes and goes. Worse with activity. ? ? ? ?+Driver, -BT, -Dye Allergies.  ?

## 2021-09-26 NOTE — Telephone Encounter (Signed)
Urine drug screen for this encounter is consistent for prescribed medication 

## 2021-09-30 DIAGNOSIS — Z6841 Body Mass Index (BMI) 40.0 and over, adult: Secondary | ICD-10-CM | POA: Diagnosis not present

## 2021-09-30 DIAGNOSIS — J479 Bronchiectasis, uncomplicated: Secondary | ICD-10-CM | POA: Diagnosis not present

## 2021-10-07 NOTE — Telephone Encounter (Signed)
Called patient to determine status of Ofev shipment from [facility] and if she has received at home. Left VM requesting return call for update.  Knox Saliva, PharmD, MPH, BCPS, CPP Clinical Pharmacist (Rheumatology and Pulmonology)

## 2021-10-08 ENCOUNTER — Ambulatory Visit (INDEPENDENT_AMBULATORY_CARE_PROVIDER_SITE_OTHER): Payer: Medicare Other | Admitting: Rehabilitative and Restorative Service Providers"

## 2021-10-08 ENCOUNTER — Encounter: Payer: Self-pay | Admitting: Rehabilitative and Restorative Service Providers"

## 2021-10-08 DIAGNOSIS — M6281 Muscle weakness (generalized): Secondary | ICD-10-CM | POA: Diagnosis not present

## 2021-10-08 DIAGNOSIS — M5459 Other low back pain: Secondary | ICD-10-CM

## 2021-10-08 DIAGNOSIS — R293 Abnormal posture: Secondary | ICD-10-CM | POA: Diagnosis not present

## 2021-10-08 DIAGNOSIS — M25552 Pain in left hip: Secondary | ICD-10-CM

## 2021-10-08 DIAGNOSIS — R262 Difficulty in walking, not elsewhere classified: Secondary | ICD-10-CM | POA: Diagnosis not present

## 2021-10-08 NOTE — Therapy (Signed)
OUTPATIENT PHYSICAL THERAPY TREATMENT NOTE/ PROGRESS NOTE   Patient Name: [name] MRN: [id number] DOB:06-17-1940, 81 y.o., female Today's Date: 10/08/2021  PCP: [name], MD REFERRING PROVIDER: [name], MD  Progress Note Reporting Period 08/16/2021 to 10/08/2021  See note below for Objective Data and Assessment of Progress/Goals.      END OF SESSION:   PT End of Session - 10/08/21 1445     Visit Number 7    Number of Visits 20    Date for PT Re-Evaluation 10/25/21    Authorization Type Medicare and [facility]    Progress Note Due on Visit 17    PT Start Time 1442    PT Stop Time 1512    PT Time Calculation (min) 30 min    Activity Tolerance Patient limited by fatigue    Behavior During Therapy WFL for tasks assessed/performed                  Past Medical History:  Diagnosis Date   Acute on chronic diastolic (congestive) heart failure (HCC)    Anemia    iron deficiency anemia - secondary to blood loss ( chronic)    Anxiety    Arthritis    endstage changes bilateral knees/bilateral ankles.    Asthma    Carotid artery occlusion    Chronic fatigue    Chronic kidney disease    Closed left hip fracture (HCC)    Clotting disorder ([place])    pt denies this   Contusion of left knee    due to fall 1/14.   COPD (chronic obstructive pulmonary disease) (HCC)    pulmonary fibrosis   Depression, reactive    Diabetes mellitus    type II    Diastolic dysfunction    Difficulty in walking    Family history of heart disease    Generalized muscle weakness    Gout    High cholesterol    History of falling    Hypertension    Hypothyroidism    Interstitial lung disease ([place])    Meningioma of left sphenoid wing involving cavernous sinus ([place]) 02/17/2012   Continue diplopia, left eye pain and left headaches.     Morbid obesity ([place])    Neuromuscular disorder ([place])    diabetic neuropathy    Normal coronary arteries    cardiac catheterization  performed  10/31/14   Pneumonia    RA (rheumatoid arthritis) ([place])    has been off methotreaxte since 10/13.   Spinal stenosis of lumbar region    Thyroid disease    Unspecified lack of coordination    URI (upper respiratory infection)    Past Surgical History:  Procedure Laterality Date   ABDOMINAL HYSTERECTOMY     BRAIN SURGERY     Gamma knife 10/13. Needs repeat spring  '14   CARDIAC CATHETERIZATION N/A 10/31/2014   Procedure: Right/Left Heart Cath and Coronary Angiography;  Surgeon: [name], MD;  Location: [facility];  Service: Cardiovascular;  Laterality: N/A;   CYST EXCISION     2 on face   CYST REMOVAL NECK     ESOPHAGOGASTRODUODENOSCOPY (EGD) WITH PROPOFOL N/A 09/14/2014   Procedure: ESOPHAGOGASTRODUODENOSCOPY (EGD) WITH PROPOFOL;  Surgeon: [name], MD;  Location: [facility];  Service: Endoscopy;  Laterality: N/A;   INCISION AND DRAINAGE HIP Left 01/16/2017   Procedure: IRRIGATION AND DEBRIDEMENT LEFT HIP;  Surgeon: [name], MD;  Location: [facility];  Service: Orthopedics;  Laterality: Left;   INTRAMEDULLARY (IM) NAIL INTERTROCHANTERIC Left 11/29/2016   Procedure: INTRAMEDULLARY (IM) NAIL INTERTROCHANTRIC;  Surgeon: [name], MD;  Location: [facility];  Service: Orthopedics;  Laterality: Left;   OVARY SURGERY     RIGHT HEART CATH N/A 08/12/2021   Procedure: RIGHT HEART CATH;  Surgeon: Lorretta Harp, MD;  Location: [facility];  Service: Cardiovascular;  Laterality: N/A;   SHOULDER SURGERY Left    TONSILLECTOMY  age 21   VIDEO BRONCHOSCOPY Bilateral 05/31/2013   Procedure: VIDEO BRONCHOSCOPY WITHOUT FLUORO;  Surgeon: Brand Males, MD;  Location: [facility];  Service: Cardiopulmonary;  Laterality: Bilateral;   video bronscoscopy  05/19/1998   lung   Patient Active Problem List   Diagnosis Date Noted   Pulmonary HTN ([place])    Unspecified protein-calorie malnutrition ([place]) 04/05/2021   Pressure injury of  skin 03/31/2021   UTI (urinary tract infection) 03/31/2021   Sepsis due to urinary tract infection ([place]) 03/30/2021   Acute encephalopathy 03/30/2021   Thrombocytopenia ([place]) 03/30/2021   Diabetic neuropathy ([place]) 05/24/2019   Hypokalemia 04/21/2019   Anticoagulated 06/25/2018   Chronic pain 06/25/2018   CRI (chronic renal insufficiency), stage 3 (moderate) (HCC) 06/25/2018   Drug-induced constipation 06/18/2018   DVT (deep venous thrombosis) ([place]) 06/03/2018   Primary osteoarthritis of both knees 05/26/2018   Body mass index (BMI) of 50-59.9 in adult ([place]) 05/26/2018   Acquired hypothyroidism 05/26/2018   Chronic bilateral low back pain without sciatica 02/17/2018   Astigmatism with presbyopia, bilateral 03/26/2017   Cortical age-related cataract of both eyes 03/26/2017   Family history of glaucoma 03/26/2017   Long term current use of oral hypoglycemic drug 03/26/2017   Nuclear sclerotic cataract of both eyes 03/26/2017   Pain in left hip 02/18/2017   Closed nondisplaced intertrochanteric fracture of left femur with delayed healing 01/14/2017   Gout 11/29/2016   Depression 11/29/2016   Closed left hip fracture ([place]) 11/29/2016   GERD (gastroesophageal reflux disease) 11/29/2016   Closed intertrochanteric fracture of hip, left, initial encounter ([place]) 11/29/2016   Physical deconditioning 07/31/2015   Pulmonary fibrosis, postinflammatory ([place]) 07/31/2015   Bronchiectasis without complication ([place]) 57/32/2025   Tendinitis of left rotator cuff 06/13/2015   Chronic diastolic CHF (congestive heart failure) ([place]) 04/21/2015   Acute kidney injury superimposed on chronic kidney disease ([place]) 04/17/2015   Chronic respiratory failure with hypoxia ([place]) 02/09/2015   Dyspnea and respiratory abnormality 01/03/2015   Normal coronary arteries 11/02/2014   Morbid obesity ([place]) 10/30/2014   Dysphagia 08/21/2014   Chronic fatigue 06/21/2014   DOE (dyspnea on exertion) 05/24/2014   Primary  osteoarthritis of left knee 04/26/2014   High risk medication use 04/17/2014   Aphasia 02/12/2014   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 ([place]) 01/18/2014   Lumbar facet arthropathy 01/18/2014   Bilateral edema of lower extremity 05/30/2013   Chronic cough 05/24/2013   ILD (interstitial lung disease) ([place]) 04/10/2013   Spinal stenosis of lumbar region with radiculopathy 07/12/2012   Inability to walk 07/12/2012   Meningioma of left sphenoid wing involving cavernous sinus ([place]) 02/17/2012   Chest pain of uncertain etiology 42/70/6237   Rheumatoid arthritis ([place]) 01/28/2012   Primary osteoarthritis of right knee 01/28/2012   Dyslipidemia    Thyroid disease    Essential hypertension     REFERRING PROVIDER: [name], MD   REFERRING DIAG: M54.50,G89.29 (ICD-10-CM) - Chronic bilateral low back pain without sciatica M25.552 (ICD-10-CM) - Pain in left hip  ONSET DATE:  Dec 2022  THERAPY DIAG:  Other low back pain  Pain in left hip  Muscle weakness (generalized)  Difficulty in walking, not elsewhere classified  Abnormal posture  PERTINENT HISTORY: HTN, hypothyroidism, history of RA, COPD, DM, high cholesterol, CHF.  ORIF Lt femur from hip fracture "about 4-5 years ago"  PRECAUTIONS: May require supplemental oxygen  SUBJECTIVE: Pt indicated complaints in back to 3/10 at most.  Pt indicated she had trouble c swelling in Rt leg and some complaints of symptoms in Rt leg.  Checked for DVT (negative) and ended up getting more fluid pills to help.   Pt indicated overall improvement towards goal function at 40% at this time.   PAIN:  Are you having pain? Yes:  NPRS scale: Worst today in back 3/10 Pain location: Back pain, A little in Lt leg Pain description: achy, sharp, tightness Aggravating factors: early morning Relieving factors: Medicine  OBJECTIVE:    PATIENT SURVEYS:  10/08/2021: update 37 % 09/12/2021: update 43% 08/16/2021 FOTO intake: 33 predicted:   37   SCREENING FOR RED FLAGS: 08/16/2021 Bowel or bladder incontinence: No Cauda equina syndrome: No     COGNITION:            08/16/2021 Overall cognitive status: Within functional limits for tasks assessed                                 SENSATION: 08/16/2021 Light touch: WFL   MUSCLE LENGTH: 08/16/2021:  none tested today   POSTURE:  08/16/2021 : rounded shoulders, increased thoracic kyphosis, reduced lumbar lordosis in standing, forward trunk lean noted   PALPATION: 08/16/2021 : Tenderness across lower back bilateral, Lt > Rt, Lateral Lt hip tenderness to touch- trigger points noted   LUMBAR ROM:    Active  AROM  08/16/2021 AROM 10/08/2021  Flexion To mid thighs c pain   Extension To neutral, ERP lumbar noted.  X5 continued pain complaints at end range < 25 % but more than neutral c ERP noted  Right lateral flexion     Left lateral flexion     Right rotation     Left rotation      (Blank rows = not tested)   LE ROM:    PROM Right 09/02/2021 Left 09/02/2021   Hip flexion  95    Hip extension       Hip abduction       Hip adduction       Hip internal rotation  25 in 90 deg flexion 35 in 90 deg flexion    Hip external rotation  55 in 90 deg flexion 45 in 90 deg flexion    Knee flexion       Knee extension       Ankle dorsiflexion       Ankle plantarflexion       Ankle inversion       Ankle eversion        (Blank rows = not tested)   LE MMT:   MMT Right 08/16/2021 Left 08/16/2021 Right 10/08/2021 Left 10/08/2021  Hip flexion 4/5 3+/5 5/5 4/5  Hip extension Test in future Test in future    Hip abduction Test in future Test in future    Hip adduction        Hip internal rotation        Hip external rotation        Knee flexion  5/5 5/5    Knee extension 5/5 4/5    Ankle dorsiflexion 5/5 5/5    Ankle plantarflexion        Ankle inversion        Ankle eversion         (Blank rows = not tested)   LUMBAR SPECIAL TESTS:  08/16/2021 (-) slump test bilateral    FUNCTIONAL TESTS:  10/08/2021:  TUG : c rollator 17.02 seconds   18 inch chair transfer s UE on 1st try  08/16/2021 TUG: c rollator 27.83 seconds   GAIT: 10/08/2021: 185 ft c rollator  09/10/2021:  Rollator 96 ft (stopped due to out of breath)  08/16/2021 Distance walked: Rollator household distances < 150 ft, modified independent                         Has wheelchair for community.        TODAY'S TREATMENT  10/08/2021 Therex:           Nustep UE/LE Lvl 5 10 mins   Standing lumbar extension to neutral c scapular retraction x 5   Seated scapular retraction 5 second hold x 10  Leg press Double leg 37 lbs 2 x 15  TherActivity ( performed to improve functional movement tolerance and progressive mobility performance)  TUG c rollator x 1  18 inch chair sit to stand c slow lowering x 5  Ambulation c rollator to fatigue 185 ft c SBA (to improve cardiovascular endurance)  09/25/2021 Therapeutic Exercises: NuStep UE/LE UE 7 Level 5 for 8 minutes Trunk extension within comfortable range 2 sets of 5 for 3 seconds Seated shoulder blade pinches 10X 5 seconds Standing heel to toe raises in walker 10X 3 seconds  Functional Activities: Leg Press for sit to stand transfers Double leg 43# 10 X slow eccentrics Single leg 5X 25# and 5X 31# each leg slow eccentrics   09/12/2021 Therex:           Nustep UE/LE Lvl 5 12 mins   Standing lumbar extension to neutral c scapular retraction x 5   Seated scapular retraction 5 second hold x 10  Leg press Double leg 37 lbs 2 x 15   09/10/2021 Therex:           Nustep UE/LE Lvl 5 10 mins   Seated press down on small pball for ab activation 5 sec hold x 15  Seated small pball squeeze hip add 5 sec hold x 15  Seated tband clam shell blue band 2 x 15 bilateral  Seated marching 2 x 10 bilateral alternating  Rollato walking 96 ft c supervision.   Leg press Double leg 37 lbs 2 x 10    PATIENT EDUCATION:  Education details: HEP, POC Person educated:  Patient Education method: Explanation, Demonstration, Verbal cues, and Handouts Education comprehension: verbalized understanding and returned demonstration     HOME EXERCISE PROGRAM: 08/16/2021  Access Code: [email or web address] URL: [email or web address] Date: 08/16/2021 Prepared by: Scot Jun   Exercises - Supine Lower Trunk Rotation  - 2-3 x daily - 7 x weekly - 1 sets - 2 reps - 15 hold - Hooklying Single Knee to Chest Stretch  - 2-3 x daily - 7 x weekly - 1 sets - 2 reps - 15 hold - Seated Scapular Retraction  - 2-3 x daily - 7 x weekly - 1 sets - 5-10 reps - 3-5 hold   ASSESSMENT:   CLINICAL IMPRESSION: Pt has attended 7  visits overall during course of treatment.  Complaints of pain still noted but overall reduced in severity for low back as noted. See objective data for updated information regarding current presentation including noted improvements in TUG, ambulation distances.  Recent decreased activity level in HEP and in clinic attendance due to medical complications related to LE edema as noted in chart. Continued ambulation deficits noted due to fatigue, ambulation status below community ambulator.   Continued skilled PT services to benefit Pt to help improve functional mobility and reach goals.   Arrival late for appointment today shortened treatment time.     OBJECTIVE IMPAIRMENTS Abnormal gait, decreased activity tolerance, decreased balance, decreased coordination, decreased endurance, decreased mobility, difficulty walking, decreased ROM, decreased strength, hypomobility, impaired perceived functional ability, increased muscle spasms, impaired flexibility, improper body mechanics, postural dysfunction, obesity, and pain.    ACTIVITY LIMITATIONS cleaning, community activity, driving, meal prep, and laundry.    PERSONAL FACTORS 3+ comorbidities: HTN, hypothyroidism, history of RA, COPD, DM, high cholesterol, CHF  are also affecting patient's functional outcome.       REHAB POTENTIAL: Fair to good   CLINICAL DECISION MAKING: Evolving/moderate complexity   EVALUATION COMPLEXITY: Moderate     GOALS: Goals reviewed with patient? Yes   Short term PT Goals (target date for Short term goals are 3 weeks 09/06/2021) Patient will demonstrate independent use of home exercise program to maintain progress from in clinic treatments. Goal status: MET - assessed 09/10/2021   Long term PT goals (target dates for all long term goals are 10 weeks  10/25/2021 )   1. Patient will demonstrate/report pain at worst less than or equal to 2/10 to facilitate minimal limitation in daily activity secondary to pain symptoms. Goal status: On Going - assessed 10/08/2021  2. Patient will demonstrate independent use of home exercise program to facilitate ability to maintain/progress functional gains from skilled physical therapy services. Goal status: On Going - assessed 10/08/2021   3. Patient will demonstrate FOTO outcome > or = 37 % to indicate reduced disability due to condition. Goal status: MET- assessed 10/08/2021   4.  Patient will demonstrate TUG c rollator < 14 seconds to indicate reduced fall risk and improved community integration level.  Goal status: On Going - assessed 10/08/2021   5.  Patient will demonstrate lumbar ext AROM > or = 50% to facilitate upright standing/walking posture for household and community intergration.    Goal status: On Going - assessed 10/08/2021   6.  Patient will demonstrate bilateral knee MMT 5/5 throughout, Lt hip MMT 4/5 or greater throughout to facilitate daily movement at [facility].   Goal status: On Going - assessed 10/08/2021   7.  Patient will demonstrate ability to walk > 500 ft s resting to facilitate ability to ambulation in community.  a.  Goal Status: On Going - assessed 10/08/2021     PLAN: PT FREQUENCY: 2x/week   PT DURATION: 10 weeks   PLANNED INTERVENTIONS: Therapeutic exercises, Therapeutic activity, Neuro Muscular  re-education, Balance training, Gait training, Patient/Family education, Joint mobilization, Stair training, DME instructions, Dry Needling, Electrical stimulation, Cryotherapy, Moist heat, Taping, Ultrasound, Ionotophoresis 56m/ml Dexamethasone, and Manual therapy.  All included unless contraindicated.   PLAN FOR NEXT SESSION: Postural strengthening, LE strengthening.  Walking distance improvements.    MScot Jun PT, DPT, OCS, ATC 10/08/21  3:13 PM

## 2021-10-15 ENCOUNTER — Ambulatory Visit: Payer: Medicare Other | Admitting: Cardiovascular Disease

## 2021-10-16 ENCOUNTER — Ambulatory Visit (INDEPENDENT_AMBULATORY_CARE_PROVIDER_SITE_OTHER): Payer: Medicare Other | Admitting: Rehabilitative and Restorative Service Providers"

## 2021-10-16 ENCOUNTER — Encounter: Payer: Self-pay | Admitting: Rehabilitative and Restorative Service Providers"

## 2021-10-16 DIAGNOSIS — R262 Difficulty in walking, not elsewhere classified: Secondary | ICD-10-CM | POA: Diagnosis not present

## 2021-10-16 DIAGNOSIS — M5459 Other low back pain: Secondary | ICD-10-CM

## 2021-10-16 DIAGNOSIS — M25552 Pain in left hip: Secondary | ICD-10-CM

## 2021-10-16 DIAGNOSIS — R293 Abnormal posture: Secondary | ICD-10-CM

## 2021-10-16 DIAGNOSIS — M6281 Muscle weakness (generalized): Secondary | ICD-10-CM | POA: Diagnosis not present

## 2021-10-16 NOTE — Therapy (Addendum)
OUTPATIENT PHYSICAL THERAPY TREATMENT NOTE   Patient Name: [name] MRN: [id number] DOB:1940/10/12, 81 y.o., female Today's Date: 10/16/2021  PCP: [name], MD REFERRING PROVIDER: [name], MD   END OF SESSION:   PT End of Session - 10/16/21 1354     Visit Number 8    Number of Visits 20    Date for PT Re-Evaluation 10/25/21    Authorization Type Medicare and [facility]    Progress Note Due on Visit 17    PT Start Time 9357    PT Stop Time 1427    PT Time Calculation (min) 38 min    Activity Tolerance Patient limited by fatigue;Patient limited by pain    Behavior During Therapy WFL for tasks assessed/performed                   Past Medical History:  Diagnosis Date   Acute on chronic diastolic (congestive) heart failure (HCC)    Anemia    iron deficiency anemia - secondary to blood loss ( chronic)    Anxiety    Arthritis    endstage changes bilateral knees/bilateral ankles.    Asthma    Carotid artery occlusion    Chronic fatigue    Chronic kidney disease    Closed left hip fracture (HCC)    Clotting disorder ([place])    pt denies this   Contusion of left knee    due to fall 1/14.   COPD (chronic obstructive pulmonary disease) (HCC)    pulmonary fibrosis   Depression, reactive    Diabetes mellitus    type II    Diastolic dysfunction    Difficulty in walking    Family history of heart disease    Generalized muscle weakness    Gout    High cholesterol    History of falling    Hypertension    Hypothyroidism    Interstitial lung disease ([place])    Meningioma of left sphenoid wing involving cavernous sinus ([place]) 02/17/2012   Continue diplopia, left eye pain and left headaches.     Morbid obesity ([place])    Neuromuscular disorder ([place])    diabetic neuropathy    Normal coronary arteries    cardiac catheterization performed  10/31/14   Pneumonia    RA (rheumatoid arthritis) ([place])    has been off methotreaxte since 10/13.   Spinal  stenosis of lumbar region    Thyroid disease    Unspecified lack of coordination    URI (upper respiratory infection)    Past Surgical History:  Procedure Laterality Date   ABDOMINAL HYSTERECTOMY     BRAIN SURGERY     Gamma knife 10/13. Needs repeat spring  '14   CARDIAC CATHETERIZATION N/A 10/31/2014   Procedure: Right/Left Heart Cath and Coronary Angiography;  Surgeon: [name], MD;  Location: [facility];  Service: Cardiovascular;  Laterality: N/A;   CYST EXCISION     2 on face   CYST REMOVAL NECK     ESOPHAGOGASTRODUODENOSCOPY (EGD) WITH PROPOFOL N/A 09/14/2014   Procedure: ESOPHAGOGASTRODUODENOSCOPY (EGD) WITH PROPOFOL;  Surgeon: [name], MD;  Location: [facility];  Service: Endoscopy;  Laterality: N/A;   INCISION AND DRAINAGE HIP Left 01/16/2017   Procedure: IRRIGATION AND DEBRIDEMENT LEFT HIP;  Surgeon: [name], MD;  Location: [facility];  Service: Orthopedics;  Laterality: Left;   INTRAMEDULLARY (IM) NAIL INTERTROCHANTERIC Left 11/29/2016   Procedure: INTRAMEDULLARY (IM) NAIL INTERTROCHANTRIC;  Surgeon: Ninfa Linden,  Lind Guest, MD;  Location: [facility];  Service: Orthopedics;  Laterality: Left;   OVARY SURGERY     RIGHT HEART CATH N/A 08/12/2021   Procedure: RIGHT HEART CATH;  Surgeon: Lorretta Harp, MD;  Location: [facility];  Service: Cardiovascular;  Laterality: N/A;   SHOULDER SURGERY Left    TONSILLECTOMY  age 14   VIDEO BRONCHOSCOPY Bilateral 05/31/2013   Procedure: VIDEO BRONCHOSCOPY WITHOUT FLUORO;  Surgeon: Brand Males, MD;  Location: [facility];  Service: Cardiopulmonary;  Laterality: Bilateral;   video bronscoscopy  05/19/1998   lung   Patient Active Problem List   Diagnosis Date Noted   Pulmonary HTN ([place])    Unspecified protein-calorie malnutrition ([place]) 04/05/2021   Pressure injury of skin 03/31/2021   UTI (urinary tract infection) 03/31/2021   Sepsis due to urinary tract infection ([place]) 03/30/2021    Acute encephalopathy 03/30/2021   Thrombocytopenia ([place]) 03/30/2021   Diabetic neuropathy ([place]) 05/24/2019   Hypokalemia 04/21/2019   Anticoagulated 06/25/2018   Chronic pain 06/25/2018   CRI (chronic renal insufficiency), stage 3 (moderate) (HCC) 06/25/2018   Drug-induced constipation 06/18/2018   DVT (deep venous thrombosis) ([place]) 06/03/2018   Primary osteoarthritis of both knees 05/26/2018   Body mass index (BMI) of 50-59.9 in adult ([place]) 05/26/2018   Acquired hypothyroidism 05/26/2018   Chronic bilateral low back pain without sciatica 02/17/2018   Astigmatism with presbyopia, bilateral 03/26/2017   Cortical age-related cataract of both eyes 03/26/2017   Family history of glaucoma 03/26/2017   Long term current use of oral hypoglycemic drug 03/26/2017   Nuclear sclerotic cataract of both eyes 03/26/2017   Pain in left hip 02/18/2017   Closed nondisplaced intertrochanteric fracture of left femur with delayed healing 01/14/2017   Gout 11/29/2016   Depression 11/29/2016   Closed left hip fracture ([place]) 11/29/2016   GERD (gastroesophageal reflux disease) 11/29/2016   Closed intertrochanteric fracture of hip, left, initial encounter ([place]) 11/29/2016   Physical deconditioning 07/31/2015   Pulmonary fibrosis, postinflammatory ([place]) 07/31/2015   Bronchiectasis without complication ([place]) 51/76/1607   Tendinitis of left rotator cuff 06/13/2015   Chronic diastolic CHF (congestive heart failure) (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 ([place]) 01/18/2014   Lumbar facet arthropathy 01/18/2014   Bilateral edema of lower extremity 05/30/2013   Chronic cough 05/24/2013   ILD (interstitial lung disease) ([place]) 04/10/2013   Spinal stenosis of lumbar region with radiculopathy 07/12/2012   Inability to walk 07/12/2012   Meningioma of left sphenoid wing involving cavernous sinus ([place]) 02/17/2012   Chest pain of uncertain etiology 37/02/6268   Rheumatoid arthritis ([place]) 01/28/2012   Primary osteoarthritis of right knee 01/28/2012   Dyslipidemia    Thyroid disease    Essential hypertension     REFERRING PROVIDER: [name], MD   REFERRING DIAG: M54.50,G89.29 (ICD-10-CM) - Chronic bilateral low back pain without sciatica M25.552 (ICD-10-CM) - Pain in left hip  ONSET DATE:  Dec 2022  THERAPY DIAG:  Other low back pain  Pain in left hip  Muscle  weakness (generalized)  Difficulty in walking, not elsewhere classified  Abnormal posture  PERTINENT HISTORY: HTN, hypothyroidism, history of RA, COPD, DM, high cholesterol, CHF.  ORIF Lt femur from hip fracture "about 4-5 years ago"  PRECAUTIONS: May require supplemental oxygen  SUBJECTIVE: Pt indicated 6/10 or so pain in knees today upon waking, insidious worsening today.  Pt indicated back hasn't been as bad, 4-5 /10 at worst in last few days.   PAIN:  Are you having pain? Yes:  NPRS scale: see above Pain location: Back pain, knee pain Pain description: achy tightness, knee pain sharp Aggravating factors: insidious worsening, pain in WB Relieving factors: Medicine  OBJECTIVE:    PATIENT SURVEYS:  10/08/2021: update 37 % 09/12/2021: update 43% 08/16/2021 FOTO intake: 33 predicted:  37   SCREENING FOR RED FLAGS: 08/16/2021 Bowel or bladder incontinence: No Cauda equina syndrome: No     COGNITION:            08/16/2021 Overall cognitive status: Within functional limits for tasks assessed                                  SENSATION: 08/16/2021 Light touch: WFL   MUSCLE LENGTH: 08/16/2021:  none tested today   POSTURE:  08/16/2021 : rounded shoulders, increased thoracic kyphosis, reduced lumbar lordosis in standing, forward trunk lean noted   PALPATION: 08/16/2021 : Tenderness across lower back bilateral, Lt > Rt, Lateral Lt hip tenderness to touch- trigger points noted   LUMBAR ROM:    Active  AROM  08/16/2021 AROM 10/08/2021  Flexion To mid thighs c pain   Extension To neutral, ERP lumbar noted.  X5 continued pain complaints at end range < 25 % but more than neutral c ERP noted  Right lateral flexion     Left lateral flexion     Right rotation     Left rotation      (Blank rows = not tested)   LE ROM:    PROM Right 09/02/2021 Left 09/02/2021 Right 10/16/2021 Left 10/16/2021  Hip flexion  95     Hip extension        Hip abduction        Hip adduction        Hip internal rotation  25 in 90 deg flexion 35 in 90 deg flexion  23 in 90 deg flexion seated 45 in 90 deg flexion seated  Hip external rotation  55 in 90 deg flexion 45 in 90 deg flexion  57 in 90 deg flexion seated  45 in 90 deg flexion seated (pain in Lt knee reported)  Knee flexion        Knee extension        Ankle dorsiflexion        Ankle plantarflexion        Ankle inversion        Ankle eversion         (Blank rows = not tested)   LE MMT:   MMT Right 08/16/2021 Left 08/16/2021 Right 10/08/2021 Left 10/08/2021  Hip flexion 4/5 3+/5 5/5 4/5  Hip extension      Hip abduction      Hip adduction        Hip internal rotation        Hip external rotation        Knee flexion 5/5 5/5    Knee extension 5/5 4/5    Ankle dorsiflexion  5/5 5/5    Ankle plantarflexion        Ankle inversion        Ankle eversion         (Blank rows = not tested)   LUMBAR SPECIAL TESTS:  08/16/2021 (-) slump test bilateral   FUNCTIONAL TESTS:  10/08/2021:  TUG : c rollator 17.02 seconds   18 inch chair transfer s UE on 1st try  08/16/2021 TUG: c  rollator 27.83 seconds   GAIT: 10/08/2021: 185 ft c rollator  09/10/2021:  Rollator 96 ft (stopped due to out of breath)  08/16/2021 Distance walked: Rollator household distances < 150 ft, modified independent                         Has wheelchair for community.        TODAY'S TREATMENT  10/16/2021 Therex:           Nustep UE/LE Lvl 5 10 mins   Seated scapular retraction 5 second hold x 10  Seated green band clam shell x 20 each(isometric hold of one while moving the other)  Seated tband rows 2 x 10 c scap retraction hold  Seated unsupported pball hip add squeeze 5 sec hold x 10    Standing gastroc /hip extension stretch at wall 15 sec x 1 bilateral(stopped due to difficulty)  Standing ankle strategy weight shift forward/back c cues for postural upright changes x 10 each way (limited due to pain/difficulty standing)   Review of HEP handout.     10/08/2021 Therex:           Nustep UE/LE Lvl 5 10 mins   Standing lumbar extension to neutral c scapular retraction x 5   Seated scapular retraction 5 second hold x 10  Leg press Double leg 37 lbs 2 x 15  TherActivity ( performed to improve functional movement tolerance and progressive mobility performance)  TUG c rollator x 1  18 inch chair sit to stand c slow lowering x 5  Ambulation c rollator to fatigue 185 ft c SBA (to improve cardiovascular endurance)  09/25/2021 Therapeutic Exercises: NuStep UE/LE UE 7 Level 5 for 8 minutes Trunk extension within comfortable range 2 sets of 5 for 3 seconds Seated shoulder blade pinches 10X 5 seconds Standing heel to toe raises in walker 10X 3 seconds  Functional Activities: Leg Press for sit to stand transfers Double leg 43# 10 X slow eccentrics Single leg 5X 25# and 5X 31# each leg slow eccentrics    PATIENT EDUCATION:  10/16/2021 Education details: HEP update Person educated: Patient Education method: Consulting civil engineer, Demonstration, Verbal cues, and Handouts Education comprehension:  verbalized understanding and returned demonstration     HOME EXERCISE PROGRAM: Access Code: [email or web address] URL: [email or web address] Date: 10/16/2021 Prepared by: Scot Jun  Exercises - Supine Lower Trunk Rotation  - 2-3 x daily - 7 x weekly - 1 sets - 2 reps - 15 hold - Hooklying Single Knee to Chest Stretch  - 2-3 x daily - 7 x weekly - 1 sets - 2 reps - 15 hold - Seated Scapular Retraction  - 2-3 x daily - 7 x weekly - 1 sets - 5-10 reps - 3-5 hold - Seated Hip Abduction with Resistance  - 1-2 x daily - 7 x weekly - 1-2 sets - 10-15 reps - Seated March with Same Side Arm Raise   - 1-2 x daily - 7 x weekly - 1-2 sets - 10 reps  ASSESSMENT:   CLINICAL IMPRESSION: Arrived today c insidious onset of worsened knee symptoms today.  Adjusted intervention to avoid WB pressure pain. Limited in intervention today in part due to Pt request to not perform supine activity.     OBJECTIVE IMPAIRMENTS Abnormal gait, decreased activity tolerance, decreased balance, decreased coordination, decreased endurance, decreased mobility, difficulty walking, decreased ROM, decreased strength, hypomobility, impaired perceived functional ability, increased muscle spasms, impaired flexibility, improper body mechanics, postural dysfunction, obesity, and pain.    ACTIVITY LIMITATIONS cleaning, community activity, driving, meal prep, and laundry.    PERSONAL FACTORS 3+ comorbidities: HTN, hypothyroidism, history of RA, COPD, DM, high cholesterol, CHF  are also affecting patient's functional outcome.      REHAB POTENTIAL: Fair to good   CLINICAL DECISION MAKING: Evolving/moderate complexity   EVALUATION COMPLEXITY: Moderate     GOALS: Goals reviewed with patient? Yes   Short term PT Goals (target date for Short term goals are 3 weeks 09/06/2021) Patient will demonstrate independent use of home exercise program to maintain progress from in clinic treatments. Goal status: MET - assessed  09/10/2021   Long term PT goals (target dates for all long term goals are 10 weeks  10/25/2021 )   1. Patient will demonstrate/report pain at worst less than or equal to 2/10 to facilitate minimal limitation in daily activity secondary to pain symptoms. Goal status: On Going - assessed 10/08/2021  2. Patient will demonstrate independent use of home exercise program to facilitate ability to maintain/progress functional gains from skilled physical therapy services. Goal status: On Going - assessed 10/08/2021   3. Patient will demonstrate FOTO outcome > or = 37 % to indicate reduced disability due to condition. Goal status: MET- assessed 10/08/2021   4.  Patient will demonstrate TUG c rollator < 14 seconds to indicate reduced fall risk and improved community integration level.  Goal status: On Going - assessed 10/08/2021   5.  Patient will demonstrate lumbar ext AROM > or = 50% to facilitate upright standing/walking posture for household and community intergration.    Goal status: On Going - assessed 10/08/2021   6.  Patient will demonstrate bilateral knee MMT 5/5 throughout, Lt hip MMT 4/5 or greater throughout to facilitate daily movement at [facility].   Goal status: On Going - assessed 10/08/2021   7.  Patient will demonstrate ability to walk > 500 ft s resting to facilitate ability to ambulation in community.  a.  Goal Status: On Going - assessed 10/08/2021     PLAN: PT FREQUENCY: 2x/week   PT DURATION: 10 weeks   PLANNED INTERVENTIONS: Therapeutic exercises, Therapeutic activity, Neuro Muscular re-education, Balance training, Gait training, Patient/Family education, Joint mobilization, Stair training, DME instructions, Dry Needling, Electrical stimulation, Cryotherapy, Moist heat, Taping, Ultrasound, Ionotophoresis 19m/ml Dexamethasone, and Manual therapy.  All included unless contraindicated.   PLAN FOR NEXT SESSION: Posterior chain musculature activation/strengthening.  Progress as tolerated.     MScot Jun PT, DPT, OCS, ATC 10/16/21  2:25 PM

## 2021-10-16 NOTE — Telephone Encounter (Signed)
Called patient to determine if she received Ofev shipment at home. Patient has taken medication for one week. Reports that she is tolerating without issue.  Knox Saliva, PharmD, MPH, BCPS, CPP Clinical Pharmacist (Rheumatology and Pulmonology)

## 2021-10-18 ENCOUNTER — Ambulatory Visit (INDEPENDENT_AMBULATORY_CARE_PROVIDER_SITE_OTHER): Payer: Medicare Other | Admitting: Rehabilitative and Restorative Service Providers"

## 2021-10-18 ENCOUNTER — Encounter: Payer: Self-pay | Admitting: Rehabilitative and Restorative Service Providers"

## 2021-10-18 DIAGNOSIS — M5459 Other low back pain: Secondary | ICD-10-CM

## 2021-10-18 DIAGNOSIS — M6281 Muscle weakness (generalized): Secondary | ICD-10-CM

## 2021-10-18 DIAGNOSIS — R262 Difficulty in walking, not elsewhere classified: Secondary | ICD-10-CM

## 2021-10-18 DIAGNOSIS — M25552 Pain in left hip: Secondary | ICD-10-CM

## 2021-10-18 DIAGNOSIS — R293 Abnormal posture: Secondary | ICD-10-CM

## 2021-10-18 NOTE — Therapy (Signed)
OUTPATIENT PHYSICAL THERAPY TREATMENT NOTE   Patient Name: [name] MRN: [id number] DOB:03-11-1941, 81 y.o., female Today's Date: 10/18/2021  PCP: [name], MD REFERRING PROVIDER: [name], MD   END OF SESSION:   PT End of Session - 10/18/21 1203     Visit Number 8    Number of Visits 20    Date for PT Re-Evaluation 10/25/21    Authorization Type Medicare and [facility]    Progress Note Due on Visit 17    PT Start Time 1155    PT Stop Time 1250    PT Time Calculation (min) 55 min    Activity Tolerance Patient limited by fatigue;Patient limited by pain    Behavior During Therapy WFL for tasks assessed/performed                   Past Medical History:  Diagnosis Date   Acute on chronic diastolic (congestive) heart failure (HCC)    Anemia    iron deficiency anemia - secondary to blood loss ( chronic)    Anxiety    Arthritis    endstage changes bilateral knees/bilateral ankles.    Asthma    Carotid artery occlusion    Chronic fatigue    Chronic kidney disease    Closed left hip fracture (HCC)    Clotting disorder ([place])    pt denies this   Contusion of left knee    due to fall 1/14.   COPD (chronic obstructive pulmonary disease) (HCC)    pulmonary fibrosis   Depression, reactive    Diabetes mellitus    type II    Diastolic dysfunction    Difficulty in walking    Family history of heart disease    Generalized muscle weakness    Gout    High cholesterol    History of falling    Hypertension    Hypothyroidism    Interstitial lung disease ([place])    Meningioma of left sphenoid wing involving cavernous sinus ([place]) 02/17/2012   Continue diplopia, left eye pain and left headaches.     Morbid obesity ([place])    Neuromuscular disorder ([place])    diabetic neuropathy    Normal coronary arteries    cardiac catheterization performed  10/31/14   Pneumonia    RA (rheumatoid arthritis) ([place])    has been off methotreaxte since 10/13.   Spinal  stenosis of lumbar region    Thyroid disease    Unspecified lack of coordination    URI (upper respiratory infection)    Past Surgical History:  Procedure Laterality Date   ABDOMINAL HYSTERECTOMY     BRAIN SURGERY     Gamma knife 10/13. Needs repeat spring  '14   CARDIAC CATHETERIZATION N/A 10/31/2014   Procedure: Right/Left Heart Cath and Coronary Angiography;  Surgeon: [name], MD;  Location: [facility];  Service: Cardiovascular;  Laterality: N/A;   CYST EXCISION     2 on face   CYST REMOVAL NECK     ESOPHAGOGASTRODUODENOSCOPY (EGD) WITH PROPOFOL N/A 09/14/2014   Procedure: ESOPHAGOGASTRODUODENOSCOPY (EGD) WITH PROPOFOL;  Surgeon: [name], MD;  Location: [facility];  Service: Endoscopy;  Laterality: N/A;   INCISION AND DRAINAGE HIP Left 01/16/2017   Procedure: IRRIGATION AND DEBRIDEMENT LEFT HIP;  Surgeon: [name], MD;  Location: [facility];  Service: Orthopedics;  Laterality: Left;   INTRAMEDULLARY (IM) NAIL INTERTROCHANTERIC Left 11/29/2016   Procedure: INTRAMEDULLARY (IM) NAIL INTERTROCHANTRIC;  Surgeon: Ninfa Linden,  Lind Guest, MD;  Location: [facility];  Service: Orthopedics;  Laterality: Left;   OVARY SURGERY     RIGHT HEART CATH N/A 08/12/2021   Procedure: RIGHT HEART CATH;  Surgeon: Lorretta Harp, MD;  Location: [facility];  Service: Cardiovascular;  Laterality: N/A;   SHOULDER SURGERY Left    TONSILLECTOMY  age 14   VIDEO BRONCHOSCOPY Bilateral 05/31/2013   Procedure: VIDEO BRONCHOSCOPY WITHOUT FLUORO;  Surgeon: Brand Males, MD;  Location: [facility];  Service: Cardiopulmonary;  Laterality: Bilateral;   video bronscoscopy  05/19/1998   lung   Patient Active Problem List   Diagnosis Date Noted   Pulmonary HTN ([place])    Unspecified protein-calorie malnutrition ([place]) 04/05/2021   Pressure injury of skin 03/31/2021   UTI (urinary tract infection) 03/31/2021   Sepsis due to urinary tract infection ([place]) 03/30/2021    Acute encephalopathy 03/30/2021   Thrombocytopenia ([place]) 03/30/2021   Diabetic neuropathy ([place]) 05/24/2019   Hypokalemia 04/21/2019   Anticoagulated 06/25/2018   Chronic pain 06/25/2018   CRI (chronic renal insufficiency), stage 3 (moderate) (HCC) 06/25/2018   Drug-induced constipation 06/18/2018   DVT (deep venous thrombosis) ([place]) 06/03/2018   Primary osteoarthritis of both knees 05/26/2018   Body mass index (BMI) of 50-59.9 in adult ([place]) 05/26/2018   Acquired hypothyroidism 05/26/2018   Chronic bilateral low back pain without sciatica 02/17/2018   Astigmatism with presbyopia, bilateral 03/26/2017   Cortical age-related cataract of both eyes 03/26/2017   Family history of glaucoma 03/26/2017   Long term current use of oral hypoglycemic drug 03/26/2017   Nuclear sclerotic cataract of both eyes 03/26/2017   Pain in left hip 02/18/2017   Closed nondisplaced intertrochanteric fracture of left femur with delayed healing 01/14/2017   Gout 11/29/2016   Depression 11/29/2016   Closed left hip fracture ([place]) 11/29/2016   GERD (gastroesophageal reflux disease) 11/29/2016   Closed intertrochanteric fracture of hip, left, initial encounter ([place]) 11/29/2016   Physical deconditioning 07/31/2015   Pulmonary fibrosis, postinflammatory ([place]) 07/31/2015   Bronchiectasis without complication ([place]) 51/76/1607   Tendinitis of left rotator cuff 06/13/2015   Chronic diastolic CHF (congestive heart failure) (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 ([place]) 01/18/2014   Lumbar facet arthropathy 01/18/2014   Bilateral edema of lower extremity 05/30/2013   Chronic cough 05/24/2013   ILD (interstitial lung disease) ([place]) 04/10/2013   Spinal stenosis of lumbar region with radiculopathy 07/12/2012   Inability to walk 07/12/2012   Meningioma of left sphenoid wing involving cavernous sinus ([place]) 02/17/2012   Chest pain of uncertain etiology 37/02/6268   Rheumatoid arthritis ([place]) 01/28/2012   Primary osteoarthritis of right knee 01/28/2012   Dyslipidemia    Thyroid disease    Essential hypertension     REFERRING PROVIDER: [name], MD   REFERRING DIAG: M54.50,G89.29 (ICD-10-CM) - Chronic bilateral low back pain without sciatica M25.552 (ICD-10-CM) - Pain in left hip  ONSET DATE:  Dec 2022  THERAPY DIAG:  Other low back pain  Pain in left hip  Muscle  weakness (generalized)  Difficulty in walking, not elsewhere classified  Abnormal posture  PERTINENT HISTORY: HTN, hypothyroidism, history of RA, COPD, DM, high cholesterol, CHF.  ORIF Lt femur from hip fracture "about 4-5 years ago"  PRECAUTIONS: May require supplemental oxygen  SUBJECTIVE: Indicated general pain/fatigue and requested no treatment today.   PAIN:  Are you having pain? Yes:  NPRS scale: see above Pain location: Back pain, knee pain Pain description: achy tightness, knee pain sharp Aggravating factors: insidious worsening, pain in WB Relieving factors: Medicine  OBJECTIVE:    PATIENT SURVEYS:  10/08/2021: update 37 % 09/12/2021: update 43% 08/16/2021 FOTO intake: 33 predicted:  37   SCREENING FOR RED FLAGS: 08/16/2021 Bowel or bladder incontinence: No Cauda equina syndrome: No     COGNITION:            08/16/2021 Overall cognitive status: Within functional limits for tasks assessed                                 SENSATION: 08/16/2021 Light touch: WFL   MUSCLE LENGTH: 08/16/2021:  none tested today    POSTURE:  08/16/2021 : rounded shoulders, increased thoracic kyphosis, reduced lumbar lordosis in standing, forward trunk lean noted   PALPATION: 08/16/2021 : Tenderness across lower back bilateral, Lt > Rt, Lateral Lt hip tenderness to touch- trigger points noted   LUMBAR ROM:    Active  AROM  08/16/2021 AROM 10/08/2021  Flexion To mid thighs c pain   Extension To neutral, ERP lumbar noted.  X5 continued pain complaints at end range < 25 % but more than neutral c ERP noted  Right lateral flexion     Left lateral flexion     Right rotation     Left rotation      (Blank rows = not tested)   LE ROM:    PROM Right 09/02/2021 Left 09/02/2021 Right 10/16/2021 Left 10/16/2021  Hip flexion  95     Hip extension        Hip abduction        Hip adduction        Hip internal rotation  25 in 90 deg flexion 35 in 90 deg flexion  23 in 90 deg flexion seated 45 in 90 deg flexion seated  Hip external rotation  55 in 90 deg flexion 45 in 90 deg flexion  57 in 90 deg flexion seated  45 in 90 deg flexion seated (pain in Lt knee reported)  Knee flexion        Knee extension        Ankle dorsiflexion        Ankle plantarflexion        Ankle inversion        Ankle eversion         (Blank rows = not tested)   LE MMT:   MMT Right 08/16/2021 Left 08/16/2021 Right 10/08/2021 Left 10/08/2021  Hip flexion 4/5 3+/5 5/5 4/5  Hip extension      Hip abduction      Hip adduction        Hip internal rotation        Hip external rotation        Knee flexion 5/5 5/5    Knee extension 5/5 4/5    Ankle dorsiflexion 5/5 5/5    Ankle plantarflexion        Ankle inversion  Ankle eversion         (Blank rows = not tested)   LUMBAR SPECIAL TESTS:  08/16/2021 (-) slump test bilateral   FUNCTIONAL TESTS:  10/08/2021:  TUG : c rollator 17.02 seconds   18 inch chair transfer s UE on 1st try  08/16/2021 TUG: c rollator 27.83 seconds   GAIT: 10/08/2021: 185 ft c rollator  09/10/2021:  Rollator 96 ft  (stopped due to out of breath)  08/16/2021 Distance walked: Rollator household distances < 150 ft, modified independent                         Has wheelchair for community.        TODAY'S TREATMENT  10/18/2021: No treatment  10/16/2021 Therex:           Nustep UE/LE Lvl 5 10 mins   Seated scapular retraction 5 second hold x 10  Seated green band clam shell x 20 each(isometric hold of one while moving the other)  Seated tband rows 2 x 10 c scap retraction hold  Seated unsupported pball hip add squeeze 5 sec hold x 10    Standing gastroc /hip extension stretch at wall 15 sec x 1 bilateral(stopped due to difficulty)  Standing ankle strategy weight shift forward/back c cues for postural upright changes x 10 each way (limited due to pain/difficulty standing)   Review of HEP handout.     10/08/2021 Therex:           Nustep UE/LE Lvl 5 10 mins   Standing lumbar extension to neutral c scapular retraction x 5   Seated scapular retraction 5 second hold x 10  Leg press Double leg 37 lbs 2 x 15  TherActivity ( performed to improve functional movement tolerance and progressive mobility performance)  TUG c rollator x 1  18 inch chair sit to stand c slow lowering x 5  Ambulation c rollator to fatigue 185 ft c SBA (to improve cardiovascular endurance)  09/25/2021 Therapeutic Exercises: NuStep UE/LE UE 7 Level 5 for 8 minutes Trunk extension within comfortable range 2 sets of 5 for 3 seconds Seated shoulder blade pinches 10X 5 seconds Standing heel to toe raises in walker 10X 3 seconds  Functional Activities: Leg Press for sit to stand transfers Double leg 43# 10 X slow eccentrics Single leg 5X 25# and 5X 31# each leg slow eccentrics    PATIENT EDUCATION:  10/16/2021 Education details: HEP update Person educated: Patient Education method: Consulting civil engineer, Demonstration, Verbal cues, and Handouts Education comprehension: verbalized understanding and returned demonstration     HOME  EXERCISE PROGRAM: Access Code: [email or web address] URL: [email or web address] Date: 10/16/2021 Prepared by: Scot Jun  Exercises - Supine Lower Trunk Rotation  - 2-3 x daily - 7 x weekly - 1 sets - 2 reps - 15 hold - Hooklying Single Knee to Chest Stretch  - 2-3 x daily - 7 x weekly - 1 sets - 2 reps - 15 hold - Seated Scapular Retraction  - 2-3 x daily - 7 x weekly - 1 sets - 5-10 reps - 3-5 hold - Seated Hip Abduction with Resistance  - 1-2 x daily - 7 x weekly - 1-2 sets - 10-15 reps - Seated March with Same Side Arm Raise   - 1-2 x daily - 7 x weekly - 1-2 sets - 10 reps   ASSESSMENT:   CLINICAL IMPRESSION: No treatment performed today per Pt request due to general pain complaints and fatigue  indicated today.      OBJECTIVE IMPAIRMENTS Abnormal gait, decreased activity tolerance, decreased balance, decreased coordination, decreased endurance, decreased mobility, difficulty walking, decreased ROM, decreased strength, hypomobility, impaired perceived functional ability, increased muscle spasms, impaired flexibility, improper body mechanics, postural dysfunction, obesity, and pain.    ACTIVITY LIMITATIONS cleaning, community activity, driving, meal prep, and laundry.    PERSONAL FACTORS 3+ comorbidities: HTN, hypothyroidism, history of RA, COPD, DM, high cholesterol, CHF  are also affecting patient's functional outcome.      REHAB POTENTIAL: Fair to good   CLINICAL DECISION MAKING: Evolving/moderate complexity   EVALUATION COMPLEXITY: Moderate     GOALS: Goals reviewed with patient? Yes   Short term PT Goals (target date for Short term goals are 3 weeks 09/06/2021) Patient will demonstrate independent use of home exercise program to maintain progress from in clinic treatments. Goal status: MET - assessed 09/10/2021   Long term PT goals (target dates for all long term goals are 10 weeks  10/25/2021 )   1. Patient will demonstrate/report pain at worst less than or equal  to 2/10 to facilitate minimal limitation in daily activity secondary to pain symptoms. Goal status: On Going - assessed 10/08/2021  2. Patient will demonstrate independent use of home exercise program to facilitate ability to maintain/progress functional gains from skilled physical therapy services. Goal status: On Going - assessed 10/08/2021   3. Patient will demonstrate FOTO outcome > or = 37 % to indicate reduced disability due to condition. Goal status: MET- assessed 10/08/2021   4.  Patient will demonstrate TUG c rollator < 14 seconds to indicate reduced fall risk and improved community integration level.  Goal status: On Going - assessed 10/08/2021   5.  Patient will demonstrate lumbar ext AROM > or = 50% to facilitate upright standing/walking posture for household and community intergration.    Goal status: On Going - assessed 10/08/2021   6.  Patient will demonstrate bilateral knee MMT 5/5 throughout, Lt hip MMT 4/5 or greater throughout to facilitate daily movement at [facility].   Goal status: On Going - assessed 10/08/2021   7.  Patient will demonstrate ability to walk > 500 ft s resting to facilitate ability to ambulation in community.  a.  Goal Status: On Going - assessed 10/08/2021     PLAN: PT FREQUENCY: 2x/week   PT DURATION: 10 weeks   PLANNED INTERVENTIONS: Therapeutic exercises, Therapeutic activity, Neuro Muscular re-education, Balance training, Gait training, Patient/Family education, Joint mobilization, Stair training, DME instructions, Dry Needling, Electrical stimulation, Cryotherapy, Moist heat, Taping, Ultrasound, Ionotophoresis 38m/ml Dexamethasone, and Manual therapy.  All included unless contraindicated.   PLAN FOR NEXT SESSION: Posterior chain musculature activation/strengthening.  Progress as tolerated.    MScot Jun PT, DPT, OCS, ATC 10/18/21  12:05 PM

## 2021-10-21 ENCOUNTER — Encounter: Payer: Self-pay | Admitting: Primary Care

## 2021-10-21 ENCOUNTER — Ambulatory Visit (INDEPENDENT_AMBULATORY_CARE_PROVIDER_SITE_OTHER): Payer: Medicare Other | Admitting: Primary Care

## 2021-10-21 VITALS — BP 120/80 | HR 124 | Temp 98.5°F | Ht 64.0 in | Wt 225.2 lb

## 2021-10-21 DIAGNOSIS — R000 Tachycardia, unspecified: Secondary | ICD-10-CM | POA: Diagnosis not present

## 2021-10-21 DIAGNOSIS — J9611 Chronic respiratory failure with hypoxia: Secondary | ICD-10-CM

## 2021-10-21 DIAGNOSIS — M359 Systemic involvement of connective tissue, unspecified: Secondary | ICD-10-CM

## 2021-10-21 DIAGNOSIS — J8489 Other specified interstitial pulmonary diseases: Secondary | ICD-10-CM | POA: Diagnosis not present

## 2021-10-21 DIAGNOSIS — J849 Interstitial pulmonary disease, unspecified: Secondary | ICD-10-CM | POA: Diagnosis not present

## 2021-10-21 NOTE — Progress Notes (Unsigned)
_0  ID: [name], female    DOB: [birth year], 81 y.o.   MRN: [id number]  No chief complaint on file.   Referring provider: [name], MD  HPI: 81 year old female, never smoked. PMH significant for ILD, chronic respiratory failure on nocturnal oxygen, bronchiectasis, rheumatoid arthritis, HTN, diastolic heart failure, GERD, type 2 diabetes, CKD stage 3, chronic back pain, hx DVT in Jan 2020. Patient of [name]. She follows with rheumatology and pulmonary at [facility].   Previous [facility] encounter: 09/13/2021 -  [name]  Chief Complaint  Patient presents with   Follow-up    Pt states she has been doing okay since last visit. States her breathing is about the same.   Problem List - copy and paste from [place]: 1.  Extensive bronchiectasis - cylindrical vs. Traction - has been present and not significantly changed from 2014 to 2017                       - has history of hospitalization for what sounds like pneumonia vs. Bronchiectasis flare as far back as 1999; underwent bronchoscopy which made her feel "like a new woman" in 1999 2.  Pulmonary fibrosis - basilar predominant reticular opacities that have not change from 2014 to 2017 and in dec 2020 CT chest 3.  Rheumatoid arthritis: RF 959 ; CCP >300                       - has been treated with methotrexate, biologics, etc in the past - none in the past several years                       - currently on 10 mg prednisone daily 4.  Morbid obesity with deconditioning - has had several hospitalizations in recent months; very deconditioned at present time  - intentl wt loss +  5. COvid - Nov 2022 associated with hospitalizations for dehydratio  HPI [name] 81 y.o. -returns for follow-up.  After the last visit we discussed the multidisciplinary case conference.  Independent radiologist concurred there was UIP and she has had slow progression.  She had right heart  catheterization and there is no pulmonary hypertension to qualify her for inhaled treprostinil based on WHO group 3.  Overall she is better from a RA perspective.  She is doing physical therapy.  No new complaints.  She is still following a strict diet she is lost some more weight.  We discussed nintedanib as an antifibrotic.  She wants to do this.  She has constipation.  Did discuss that sometimes she could have diverticulitis and abdominal . Which is very rare but have seen 1 or 2 patients in the setting of opioids do this especially with nintedanib.  She is accepting of the risk.  Discussed rare possibilities of bleeding and MI.  She is understanding.  She wants to start the medication.   10/21/2021- Interim hx Patient presents today for 1 month follow-up. ILD felt to be slowly progressing. No evidene of pulmonary Htn on heart cath March 2023. During her last visit with Dr. Jamison Neighbor on 09/13/21 she was started on lower dose of OFEV 122m twice daily. Plan is to stay on this dose for three months and if doing well can escalate to full dose.   She is doing alright today. Overall she is tolerating Ofev medication despite some mild nausea.  She is currently only taking 100 mg tablet daily.  She started medication 2 weeks ago.  Her ILD score is baseline today if not somewhat better.  She is somewhat tachycardic on exam today which she states is not new.  She typically wears supplemental oxygen as needed during the daytime to maintain O2 greater than 88% but came into office today without her portable oxygen concentrator.  She continues to wear 3 L of oxygen at bedtime. She saw [facility] on 09/30/21, her O2 at rest 96% and HR at start of exercise was 122/max 150. Lowest SpO2 95% on room air.    SYMPTOM SCALE - ILD 06/21/2019 Birth weight not on file Last Weight  Most recent update: 06/21/2019 11:53 AM    Weight  113.3 kg (249 lb 12.8 oz)             05/23/2021 224# (used to 300# some yearsaago)  07/12/2021 224# 09/13/2021 224# 10/21/2021 On OFEV 189m daily ; plan to increase twice daily today   O2 use 2L nithg 3L 3L  Today she is on RA; Uses 3L as needed daytime and at night  Shortness of Breath 0 -> 5 scale with 5 being worst (score 6 If unable to do)      At rest _0 2.5  Simple tasks - showers, clothes change, eating, shaving _1 Household (dishes, doing bed, laundry) _2 Shopping _3 Walking level at own pace _4 Walking up Stairs 6  x  x  Total (30-36) Dyspnea Score _5 19.5  How bad is your cough? _6 How bad is your fatigue 0 0 4  4.5  How bad is nausea 0 0 0  2  How bad is vomiting?  0 0 0  0  How bad is diarrhea? 0 0 0  0  How bad is anxiety? _7 How bad is depression _8 Allergies  Allergen Reactions   Codeine Swelling and Other (See Comments)    Facial swelling Chest pain and swelling in legs     Infliximab Anaphylaxis    (REMICADE) "sent me into shock"    Lisinopril Swelling    Face and neck swelling     Immunization History  Administered Date(s) Administered   Fluad Quad(high Dose 65+) 02/02/2019, 03/11/2021   Influenza Split 01/29/2012, 03/30/2013, 02/10/2014   Influenza, High Dose Seasonal PF 02/16/2017, 02/11/2018, 01/31/2019, 02/27/2020   Influenza,inj,Quad PF,6+ Mos 02/09/2015, 12/18/2015   Influenza-Unspecified 02/09/2015, 12/18/2015   PFIZER Comirnaty(Gray Top)Covid-19 Tri-Sucrose Vaccine 07/24/2019, 08/14/2019, 02/27/2020   PFIZER(Purple Top)SARS-COV-2 Vaccination 07/17/2019, 08/14/2019   PPD Test 04/23/2015   Pneumococcal Conjugate-13 08/21/2014   Pneumococcal Polysaccharide-23 02/17/2012   Tdap 12/27/2015    Past Medical History:  Diagnosis Date   Acute on chronic diastolic (congestive) heart failure (HCC)    Anemia    iron deficiency anemia - secondary to blood loss ( chronic)    Anxiety    Arthritis    endstage changes bilateral knees/bilateral ankles.    Asthma     Carotid artery occlusion    Chronic fatigue    Chronic kidney disease    Closed left hip fracture (HCC)    Clotting disorder ([place]    pt denies this   Contusion of left knee  due to fall 1/14.   COPD (chronic obstructive pulmonary disease) (HCC)    pulmonary fibrosis   Depression, reactive    Diabetes mellitus    type II    Diastolic dysfunction    Difficulty in walking    Family history of heart disease    Generalized muscle weakness    Gout    High cholesterol    History of falling    Hypertension    Hypothyroidism    Interstitial lung disease ([place])    Meningioma of left sphenoid wing involving cavernous sinus ([place]) 02/17/2012   Continue diplopia, left eye pain and left headaches.     Morbid obesity ([place])    Neuromuscular disorder ([place])    diabetic neuropathy    Normal coronary arteries    cardiac catheterization performed  10/31/14   Pneumonia    RA (rheumatoid arthritis) ([place])    has been off methotreaxte since 10/13.   Spinal stenosis of lumbar region    Thyroid disease    Unspecified lack of coordination    URI (upper respiratory infection)     Tobacco History: Social History   Tobacco Use  Smoking Status Never  Smokeless Tobacco Never   Counseling given: Not Answered   Outpatient Medications Prior to Visit  Medication Sig Dispense Refill   acetaminophen (TYLENOL) 500 MG tablet Take 500-1,000 mg by mouth every 6 (six) hours as needed for moderate pain.     albuterol (PROVENTIL) (2.5 MG/3ML) 0.083% nebulizer solution Take 3 mLs (2.5 mg total) by nebulization every 6 (six) hours as needed for wheezing. 75 mL 0   albuterol (VENTOLIN HFA) 108 (90 Base) MCG/ACT inhaler Inhale 2 puffs into the lungs every 6 (six) hours as needed for wheezing or shortness of breath. 6.7 g 0   allopurinol (ZYLOPRIM) 300 MG tablet Take 1 tablet (300 mg total) by mouth daily. 30 tablet 0   Artificial Tear Ointment (DRY EYES OP) Place 1 drop into both eyes 2 (two) times daily as  needed (dry eyes).     aspirin 325 MG tablet Take 1 tablet (325 mg total) by mouth daily. 30 tablet 0   atorvastatin (LIPITOR) 20 MG tablet Take 0.5 tablets (10 mg total) by mouth every evening. 15 tablet 0   Calcium Carb-Cholecalciferol (CALCIUM + D3 PO) Take 2 tablets by mouth daily with lunch.      Cholecalciferol (VITAMIN D-3) 25 MCG (1000 UT) CAPS Take 2 capsules (2,000 Units total) by mouth daily. GIVE ONE TAB PO Q DAY 60 capsule 0   colchicine 0.6 MG tablet Take 1 tablet (0.6 mg total) by mouth daily as needed (gout flares). 30 tablet 0   cyclobenzaprine (FLEXERIL) 5 MG tablet Take 1 tablet (5 mg total) by mouth at bedtime as needed for muscle spasms. 30 tablet 0   diclofenac Sodium (VOLTAREN) 1 % GEL Apply 2 g topically 3 (three) times daily. Apply to both hands and knees 100 g 1   Dulaglutide (TRULICITY) 7.03 JK/0.9FG SOPN Inject 0.75 mg into the skin once a week. (Patient taking differently: Inject 0.75 mg into the skin every Wednesday.) 6 mL 2   DULoxetine (CYMBALTA) 60 MG capsule Take 1 capsule (60 mg total) by mouth daily. 30 capsule 0   fluticasone (FLONASE) 50 MCG/ACT nasal spray Place 2 sprays into both nostrils daily. 16 g 3   furosemide (LASIX) 80 MG tablet Take 1 tablet (80 mg total) by mouth daily. 30 tablet 11   gabapentin (NEURONTIN) 600 MG tablet  Take 2 tablets (1,200 mg total) by mouth 3 (three) times daily. 180 tablet 0   guaiFENesin (MUCINEX) 600 MG 12 hr tablet Take 2 tablets (1,200 mg total) by mouth 2 (two) times daily. 120 tablet 0   hydroxychloroquine (PLAQUENIL) 200 MG tablet Take 1 tablet (200 mg total) by mouth daily. 30 tablet 0   leflunomide (ARAVA) 20 MG tablet Take 1 tablet (20 mg total) by mouth daily. 30 tablet 0   levothyroxine (SYNTHROID) 50 MCG tablet Take 1 tablet (50 mcg total) by mouth daily. 90 tablet 3   linaclotide (LINZESS) 72 MCG capsule Take 1 capsule (72 mcg total) by mouth daily before breakfast. 30 capsule 3   metolazone (ZAROXOLYN) 2.5 MG  tablet TAKE 1 TABLET BY MOUTH DAILY AS NEEDED FOR 2 DAYS FOR WEIGHT ABOVE 255 POUNDS 30 tablet 0   metoprolol succinate (TOPROL-XL) 25 MG 24 hr tablet Take 5 tablets (125 mg total) by mouth daily. 150 tablet 3   morphine (MS CONTIN) 15 MG 12 hr tablet Take 1 tablet (15 mg total) by mouth every 12 (twelve) hours. Do Not Fill Before 10/18/2021 60 tablet 0   morphine (MSIR) 15 MG tablet One tablet twice a day as needed for break through pain. Do Not Fill Before 10/18/2021 60 tablet 0   Multiple Vitamin (MULTIVITAMIN WITH MINERALS) TABS Take 1 tablet by mouth daily.      Nintedanib (OFEV) 100 MG CAPS Take 1 capsule (100 mg total) by mouth 2 (two) times daily. 180 capsule 1   olmesartan (BENICAR) 40 MG tablet Take 1 tablet (40 mg total) by mouth daily. 30 tablet 0   OXYGEN-HELIUM IN Inhale 3 L into the lungs at bedtime. And on exertion     pantoprazole (PROTONIX) 40 MG tablet take 1 tablet by mouth once daily MINUTES BEFORE 1ST MEAL OF THE DAY 90 tablet 3   Potassium Chloride ER 20 MEQ TBCR Take 20 mEq by mouth daily. 30 tablet 0   predniSONE (DELTASONE) 10 MG tablet Take 1 tablet (10 mg total) by mouth daily with breakfast. 90 tablet 1   sodium chloride (OCEAN) 0.65 % SOLN nasal spray Place 1 spray into both nostrils as needed for congestion.     No facility-administered medications prior to visit.    Review of Systems  Review of Systems  Constitutional:  Positive for fatigue.  HENT: Negative.    Respiratory:  Positive for shortness of breath. Negative for cough, chest tightness and wheezing.   Cardiovascular: Negative.     Physical Exam  There were no vitals taken for this visit. Physical Exam Constitutional:      General: She is not in acute distress.    Appearance: Normal appearance. She is not ill-appearing.  HENT:     Head: Normocephalic and atraumatic.  Cardiovascular:     Rate and Rhythm: Regular rhythm. Tachycardia present.     Comments: Regular rhythm, HR 118  Pulmonary:      Effort: Pulmonary effort is normal.     Breath sounds: Rales present.     Comments: On RA Musculoskeletal:        General: Normal range of motion.  Skin:    General: Skin is dry.  Neurological:     General: No focal deficit present.     Mental Status: She is alert and oriented to person, place, and time. Mental status is at baseline.     Lab Results:  CBC    Component Value Date/Time   WBC 7.7  09/25/2021 1607   RBC 3.87 09/25/2021 1607   HGB 11.9 (L) 09/25/2021 1607   HGB 11.7 02/22/2018 1256   HGB 12.4 02/17/2012 1337   HCT 37.3 09/25/2021 1607   HCT 35.9 02/22/2018 1256   HCT 37.2 02/17/2012 1337   PLT 153.0 09/25/2021 1607   PLT 205 02/22/2018 1256   MCV 96.2 09/25/2021 1607   MCV 92 02/22/2018 1256   MCV 94.1 02/17/2012 1337   MCH 29.7 04/02/2021 0056   MCHC 31.9 09/25/2021 1607   RDW 15.3 09/25/2021 1607   RDW 14.0 02/22/2018 1256   RDW 16.0 (H) 02/17/2012 1337   LYMPHSABS 1.3 09/25/2021 1607   LYMPHSABS 1.2 02/17/2012 1337   MONOABS 0.3 09/25/2021 1607   MONOABS 0.4 02/17/2012 1337   EOSABS 0.1 09/25/2021 1607   EOSABS 0.3 02/17/2012 1337   BASOSABS 0.1 09/25/2021 1607   BASOSABS 0.0 02/17/2012 1337    BMET    Component Value Date/Time   NA 144 09/25/2021 1607   NA 149 (A) 08/01/2020 0000   NA 139 02/17/2012 1337   K 4.0 09/25/2021 1607   K 4.8 02/17/2012 1337   CL 103 09/25/2021 1607   CL 103 02/17/2012 1337   CO2 34 (H) 09/25/2021 1607   CO2 25 02/17/2012 1337   GLUCOSE 123 (H) 09/25/2021 1607   GLUCOSE 112 (H) 02/17/2012 1337   BUN 17 09/25/2021 1607   BUN 23 (A) 08/01/2020 0000   BUN 32.0 (H) 02/17/2012 1337   CREATININE 0.87 09/25/2021 1607   CREATININE 1.57 (H) 09/17/2016 1438   CREATININE 1.7 (H) 02/17/2012 1337   CALCIUM 8.9 09/25/2021 1607   CALCIUM 8.9 02/17/2012 1337   GFRNONAA >60 04/02/2021 0056   GFRNONAA 32 (L) 09/17/2016 1438   GFRAA 49 (L) 11/10/2019 2052   GFRAA 37 (L) 09/17/2016 1438    BNP    Component Value  Date/Time   BNP 25.2 04/01/2021 0938    ProBNP    Component Value Date/Time   PROBNP 10.0 09/25/2021 1607    Imaging: VAS [place] LOWER EXTREMITY VENOUS (DVT)  Result Date: 09/27/2021  Lower Venous DVT Study Patient Name:  ILEANE SANDO  Date of Exam:   09/26/2021 Medical Rec #: [id number]          Accession #:    [id number] Date of Birth: 04/26/1941          Patient Gender: F Patient Age:   63 years Exam Location:  [facility] Procedure:      VAS [place] LOWER EXTREMITY VENOUS (DVT) Referring Phys: Larene Beach BANKS --------------------------------------------------------------------------------  Other Indications: Right lower extremity swelling. Lasix does not seem to be                    helping. Legs are very painful for the patient. Limitations: Body habitus. Performing Technologist: Mariane Masters RVT  Examination Guidelines: A complete evaluation includes B-mode imaging, spectral Doppler, color Doppler, and power Doppler as needed of all accessible portions of each vessel. Bilateral testing is considered an integral part of a complete examination. Limited examinations for reoccurring indications may be performed as noted. The reflux portion of the exam is performed with the patient in reverse Trendelenburg.  +---------+---------------+---------+-----------+----------+-------------------+ RIGHT    CompressibilityPhasicitySpontaneityPropertiesThrombus Aging      +---------+---------------+---------+-----------+----------+-------------------+ CFV      Full           Yes      Yes                                      +---------+---------------+---------+-----------+----------+-------------------+  SFJ      Full           Yes      Yes                                      +---------+---------------+---------+-----------+----------+-------------------+ FV Prox  Full           Yes      Yes                                       +---------+---------------+---------+-----------+----------+-------------------+ FV Mid   Full           Yes      Yes                                      +---------+---------------+---------+-----------+----------+-------------------+ FV DistalFull           Yes      Yes                                      +---------+---------------+---------+-----------+----------+-------------------+ PFV      Full                                                             +---------+---------------+---------+-----------+----------+-------------------+ POP      Full           Yes      Yes                                      +---------+---------------+---------+-----------+----------+-------------------+ PTV      Full                                                             +---------+---------------+---------+-----------+----------+-------------------+ PERO                                                  Not well visualized +---------+---------------+---------+-----------+----------+-------------------+ Gastroc  Full                                                             +---------+---------------+---------+-----------+----------+-------------------+ GSV      Full           Yes      Yes                                      +---------+---------------+---------+-----------+----------+-------------------+   +---------+---------------+---------+-----------+----------+--------------+  LEFT     CompressibilityPhasicitySpontaneityPropertiesThrombus Aging +---------+---------------+---------+-----------+----------+--------------+ CFV      Full           Yes      Yes                                 +---------+---------------+---------+-----------+----------+--------------+ SFJ      Full           Yes      Yes                                 +---------+---------------+---------+-----------+----------+--------------+ FV Prox  Full           Yes       Yes                                 +---------+---------------+---------+-----------+----------+--------------+ FV Mid   Full           Yes      Yes                                 +---------+---------------+---------+-----------+----------+--------------+ FV DistalFull           Yes      Yes                                 +---------+---------------+---------+-----------+----------+--------------+ PFV      Full                                                        +---------+---------------+---------+-----------+----------+--------------+ POP      Full           Yes      Yes                                 +---------+---------------+---------+-----------+----------+--------------+ PTV      Full                                                        +---------+---------------+---------+-----------+----------+--------------+ PERO     Full                                                        +---------+---------------+---------+-----------+----------+--------------+ Gastroc  Full                                                        +---------+---------------+---------+-----------+----------+--------------+ GSV      Full  Yes      Yes                                 +---------+---------------+---------+-----------+----------+--------------+    Findings reported to Dr. Volanda Napoleon via staff message at 4:35 pm.  Summary: RIGHT: - No evidence of deep vein thrombosis in the lower extremity. No indirect evidence of obstruction proximal to the inguinal ligament. - No cystic structure found in the popliteal fossa.  LEFT: - No evidence of deep vein thrombosis in the lower extremity. No indirect evidence of obstruction proximal to the inguinal ligament. - No cystic structure found in the popliteal fossa.  *See table(s) above for measurements and observations. Electronically signed by Kathlyn Sacramento MD on 09/27/2021 at 12:51:39 PM.    Final      Assessment &  Plan:   No problem-specific Assessment & Plan notes found for this encounter.   EKG showed sinus tachycardia   Martyn Ehrich, NP 10/21/2021

## 2021-10-21 NOTE — Patient Instructions (Addendum)
Recommendations: Continue OFEV 15m- increase to twice daily Wear oxygen consistently with exertion and a night to maintain O2 >88-90%  Please get labs in 2 weeks  Orders: ECG re: tachycardia (ordered) Spirometry with DLCO in 6-8 weeks (ordered) Hepatic function in 2 weeks (please order for [facility])  Follow-up: In 6-8 weeks with MR and PFTs (if not already scheduled)

## 2021-10-22 ENCOUNTER — Telehealth: Payer: Self-pay | Admitting: *Deleted

## 2021-10-22 ENCOUNTER — Ambulatory Visit: Payer: Self-pay

## 2021-10-22 ENCOUNTER — Ambulatory Visit (INDEPENDENT_AMBULATORY_CARE_PROVIDER_SITE_OTHER): Payer: Medicare Other | Admitting: Physical Medicine and Rehabilitation

## 2021-10-22 ENCOUNTER — Encounter: Payer: Self-pay | Admitting: Physical Medicine and Rehabilitation

## 2021-10-22 ENCOUNTER — Encounter: Payer: Medicare Other | Admitting: Rehabilitative and Restorative Service Providers"

## 2021-10-22 VITALS — BP 122/79 | HR 91

## 2021-10-22 DIAGNOSIS — M5416 Radiculopathy, lumbar region: Secondary | ICD-10-CM | POA: Diagnosis not present

## 2021-10-22 DIAGNOSIS — R000 Tachycardia, unspecified: Secondary | ICD-10-CM | POA: Insufficient documentation

## 2021-10-22 MED ORDER — METHYLPREDNISOLONE ACETATE 80 MG/ML IJ SUSP
80.0000 mg | Freq: Once | INTRAMUSCULAR | Status: AC
  Administered 2021-10-22: 80 mg

## 2021-10-22 NOTE — Patient Instructions (Signed)
[facility] Discharge Instructions  *At any time if you have questions or concerns they can be answered by calling [phone]  All Patients: You may experience an increase in your symptoms for the first 2 days (it can take 2 days to 2 weeks for the steroid/cortisone to have its maximal effect). You may use ice to the site for the first 24 hours; 20 minutes on and 20 minutes off and may use heat after that time. You may resume and continue your current pain medications. If you need a refill please contact the prescribing physician. You may resume your medications if any were stopped for the procedure. You may shower but no swimming, tub bath or Jacuzzi for 24 hours. Please remove bandage after 4 hours. You may resume light activities as tolerated. If you had Spine Injection, you should not drive for the next 3 hours due to anesthetics used in the procedure. Please have someone drive for you.  *If you have had sedation, Valium, Xanax, or lorazepam: Do not drive or use public transportation for 24 hours, do not operating hazardous machinery or make important personal/business decisions for 24 hours.  POSSIBLE STEROID SIDE EFFECTS: If experienced these should only last for a short period. Change in menstrual flow  Edema in (swelling)  Increased appetite Skin flushing (redness)  Skin rash/acne  Thrush (oral) Vaginitis    Increased sweating  Depression Increased blood glucose levels Cramping and leg/calf  Euphoria (feeling happy)  POSSIBLE PROCEDURE SIDE EFFECTS: Please call our office if concerned. Increased pain Increased numbness/tingling  Headache Nausea/vomiting Hematoma (bruising/bleeding) Edema (swelling at the site) Weakness  Infection (red/drainage at site) Fever greater than 100.78F  *In the event of a headache after epidural steroid injection: Drink plenty of fluids, especially water and try to lay flat when possible. If the headache does not get better after a few days  or as always if concerned please call the office.

## 2021-10-22 NOTE — Progress Notes (Unsigned)
Pt state lower back pain that travels down her left leg. Pt state walking, standing and laying down. Pt state she takes pain meds to help ease her pain.  Numeric Pain Rating Scale and Functional Assessment Average Pain 7   In the last MONTH (on 0-10 scale) has pain interfered with the following?  1. General activity like being  able to carry out your everyday physical activities such as walking, climbing stairs, carrying groceries, or moving a chair?  Rating(9)   +Driver, -BT, -Dye Allergies.

## 2021-10-22 NOTE — Assessment & Plan Note (Addendum)
-  Patient was started on Ofev 164m BID in May 2023. She is tolerating antifibrotic medication despite some mild nausea. No nausea, vomiting or weight loss. She was only taking medication once a day by mistake.  Advised she increase Ofev dose to twice daily as instructed. She will need hepatic function test in 2 weeks. Follow-up in 6-8 weeks with spirometry and DLCO and office visit.

## 2021-10-22 NOTE — Assessment & Plan Note (Signed)
-  EKG showed sinus tachycardia. Denies chest pain. Following with cardiology, next apt in 10/28/21

## 2021-10-22 NOTE — Telephone Encounter (Signed)
Called and spoke with patient's daughter, Voncille Lo [facility]), confirmed that patient will be having her labs drawn in 2 weeks at [facility] on [address].  Called location and verified fax # to fax paperwork.  Lab forms faxed to [phone].

## 2021-10-22 NOTE — Assessment & Plan Note (Signed)
-  Stable; Advised she wear oxygen consistently with exertion to maintain O2 >88-90%  and 3L at night

## 2021-10-23 ENCOUNTER — Ambulatory Visit (INDEPENDENT_AMBULATORY_CARE_PROVIDER_SITE_OTHER): Payer: Medicare Other

## 2021-10-23 VITALS — BP 155/94 | Ht 64.0 in | Wt 224.0 lb

## 2021-10-23 DIAGNOSIS — Z0000 Encounter for general adult medical examination without abnormal findings: Secondary | ICD-10-CM | POA: Diagnosis not present

## 2021-10-23 NOTE — Patient Instructions (Addendum)
[name] , Thank you for taking time to come for your Medicare Wellness Visit. I appreciate your ongoing commitment to your health goals. Please review the following plan we discussed and let me know if I can assist you in the future.   These are the goals we discussed:  Goals       Exercise 3x per week (30 min per time)      Patient stated (pt-stated)      I would like to Stand and move better        This is a list of the screening recommended for you and due dates:  Health Maintenance  Topic Date Due   Hemoglobin A1C  09/30/2021   Complete foot exam   12/19/2021*   Eye exam for diabetics  12/19/2021*   COVID-19 Vaccine (6 - Booster) 12/19/2021*   Hepatitis C Screening: USPSTF Recommendation to screen - Ages 18-79 yo.  12/19/2021*   Zoster (Shingles) Vaccine (1 of 2) 12/19/2021*   Flu Shot  12/17/2021   Tetanus Vaccine  12/26/2025   Pneumonia Vaccine  Completed   DEXA scan (bone density measurement)  Completed   HPV Vaccine  Aged Out  *Topic was postponed. The date shown is not the original due date.    Opioid Pain Medicine Management Opioids are powerful medicines that are used to treat moderate to severe pain. When used for short periods of time, they can help you to: Sleep better. Do better in physical or occupational therapy. Feel better in the first few days after an injury. Recover from surgery. Opioids should be taken with the supervision of a trained health care provider. They should be taken for the shortest period of time possible. This is because opioids can be addictive, and the longer you take opioids, the greater your risk of addiction. This addiction can also be called opioid use disorder. What are the risks? Using opioid pain medicines for longer than 3 days increases your risk of side effects. Side effects include: Constipation. Nausea and vomiting. Breathing difficulties (respiratory depression). Drowsiness. Confusion. Opioid use  disorder. Itching. Taking opioid pain medicine for a long period of time can affect your ability to do daily tasks. It also puts you at risk for: Motor vehicle crashes. Depression. Suicide. Heart attack. Overdose, which can be life-threatening. What is a pain treatment plan? A pain treatment plan is an agreement between you and your health care provider. Pain is unique to each person, and treatments vary depending on your condition. To manage your pain, you and your health care provider need to work together. To help you do this: Discuss the goals of your treatment, including how much pain you might expect to have and how you will manage the pain. Review the risks and benefits of taking opioid medicines. Remember that a good treatment plan uses more than one approach and minimizes the chance of side effects. Be honest about the amount of medicines you take and about any drug or alcohol use. Get pain medicine prescriptions from only one health care provider. Pain can be managed with many types of alternative treatments. Ask your health care provider to refer you to one or more specialists who can help you manage pain through: Physical or occupational therapy. Counseling (cognitive behavioral therapy). Good nutrition. Biofeedback. Massage. Meditation. Non-opioid medicine. Following a gentle exercise program. How to use opioid pain medicine Taking medicine Take your pain medicine exactly as told by your health care provider. Take it only when you need it.  If your pain gets less severe, you may take less than your prescribed dose if your health care provider approves. If you are not having pain, do nottake pain medicine unless your health care provider tells you to take it. If your pain is severe, do nottry to treat it yourself by taking more pills than instructed on your prescription. Contact your health care provider for help. Write down the times when you take your pain medicine. It is  easy to become confused while on pain medicine. Writing the time can help you avoid overdose. Take other over-the-counter or prescription medicines only as told by your health care provider. Keeping yourself and others safe  While you are taking opioid pain medicine: Do not drive, use machinery, or power tools. Do not sign legal documents. Do not drink alcohol. Do not take sleeping pills. Do not supervise children by yourself. Do not do activities that require climbing or being in high places. Do not go to a lake, river, ocean, spa, or swimming pool. Do not share your pain medicine with anyone. Keep pain medicine in a locked cabinet or in a secure area where pets and children cannot reach it. Stopping your use of opioids If you have been taking opioid medicine for more than a few weeks, you may need to slowly decrease (taper) how much you take until you stop completely. Tapering your use of opioids can decrease your risk of symptoms of withdrawal, such as: Pain and cramping in the abdomen. Nausea. Sweating. Sleepiness. Restlessness. Uncontrollable shaking (tremors). Cravings for the medicine. Do not attempt to taper your use of opioids on your own. Talk with your health care provider about how to do this. Your health care provider may prescribe a step-down schedule based on how much medicine you are taking and how long you have been taking it. Getting rid of leftover pills Do not save any leftover pills. Get rid of leftover pills safely by: Taking the medicine to a prescription take-back program. This is usually offered by the county or law enforcement. Bringing them to a pharmacy that has a drug disposal container. Flushing them down the toilet. Check the label or package insert of your medicine to see whether this is safe to do. Throwing them out in the trash. Check the label or package insert of your medicine to see whether this is safe to do. If it is safe to throw it out, remove  the medicine from the original container, put it into a sealable bag or container, and mix it with used coffee grounds, food scraps, dirt, or cat litter before putting it in the trash. Follow these instructions at home: Activity Do exercises as told by your health care provider. Avoid activities that make your pain worse. Return to your normal activities as told by your health care provider. Ask your health care provider what activities are safe for you. General instructions You may need to take these actions to prevent or treat constipation: Drink enough fluid to keep your urine pale yellow. Take over-the-counter or prescription medicines. Eat foods that are high in fiber, such as beans, whole grains, and fresh fruits and vegetables. Limit foods that are high in fat and processed sugars, such as fried or sweet foods. Keep all follow-up visits. This is important. Where to find support If you have been taking opioids for a long time, you may benefit from receiving support for quitting from a local support group or counselor. Ask your health care provider for a referral  to these resources in your area. Where to find more information Centers for Disease Control and Prevention (CDC): http://www.wolf.info/ [place] Food and Drug Administration (FDA): [place] Get help right away if: You may have taken too much of an opioid (overdosed). Common symptoms of an overdose: Your breathing is slower or more shallow than normal. You have a very slow heartbeat (pulse). You have slurred speech. You have nausea and vomiting. Your pupils become very small. You have other potential symptoms: You are very confused. You faint or feel like you will faint. You have cold, clammy skin. You have blue lips or fingernails. You have thoughts of harming yourself or harming others. These symptoms may represent a serious problem that is an emergency. Do not wait to see if the symptoms will go away. Get medical help right away.  Call your local emergency services (911 in the [place] Do not drive yourself to the hospital.  If you ever feel like you may hurt yourself or others, or have thoughts about taking your own life, get help right away. Go to your nearest emergency department or: Call your local emergency services (911 in the [place] Call the [facility] (862) 161-0991 in the [place] Call a suicide crisis helpline, such as the [facility] at 917-875-9988 or 988 in the [place]. This is open 24 hours a day in the [place] Text the Crisis Text Line at 620 681 4372 (in the [place].). Summary Opioid medicines can help you manage moderate to severe pain for a short period of time. A pain treatment plan is an agreement between you and your health care provider. Discuss the goals of your treatment, including how much pain you might expect to have and how you will manage the pain. If you think that you or someone else may have taken too much of an opioid, get medical help right away. This information is not intended to replace advice given to you by your health care provider. Make sure you discuss any questions you have with your health care provider. Document Revised: 11/28/2020 Document Reviewed: 08/15/2020 Elsevier Patient Education  [place] directives: Yes Copies on file  Conditions/risks identified: None  Next appointment: Follow up in one year for your annual wellness visit     Preventive Care 65 Years and Older, Female Preventive care refers to lifestyle choices and visits with your health care provider that can promote health and wellness. What does preventive care include? A yearly physical exam. This is also called an annual well check. Dental exams once or twice a year. Routine eye exams. Ask your health care provider how often you should have your eyes checked. Personal lifestyle choices, including: Daily care of your teeth and gums. Regular physical  activity. Eating a healthy diet. Avoiding tobacco and drug use. Limiting alcohol use. Practicing safe sex. Taking low-dose aspirin every day. Taking vitamin and mineral supplements as recommended by your health care provider. What happens during an annual well check? The services and screenings done by your health care provider during your annual well check will depend on your age, overall health, lifestyle risk factors, and family history of disease. Counseling  Your health care provider may ask you questions about your: Alcohol use. Tobacco use. Drug use. Emotional well-being. Home and relationship well-being. Sexual activity. Eating habits. History of falls. Memory and ability to understand (cognition). Work and work Statistician. Reproductive health. Screening  You may have the following tests or measurements: Height, weight, and BMI. Blood pressure.  Lipid and cholesterol levels. These may be checked every 5 years, or more frequently if you are over 70 years old. Skin check. Lung cancer screening. You may have this screening every year starting at age 73 if you have a 30-pack-year history of smoking and currently smoke or have quit within the past 15 years. Fecal occult blood test (FOBT) of the stool. You may have this test every year starting at age 55. Flexible sigmoidoscopy or colonoscopy. You may have a sigmoidoscopy every 5 years or a colonoscopy every 10 years starting at age 56. Hepatitis C blood test. Hepatitis B blood test. Sexually transmitted disease (STD) testing. Diabetes screening. This is done by checking your blood sugar (glucose) after you have not eaten for a while (fasting). You may have this done every 1-3 years. Bone density scan. This is done to screen for osteoporosis. You may have this done starting at age 14. Mammogram. This may be done every 1-2 years. Talk to your health care provider about how often you should have regular mammograms. Talk with your  health care provider about your test results, treatment options, and if necessary, the need for more tests. Vaccines  Your health care provider may recommend certain vaccines, such as: Influenza vaccine. This is recommended every year. Tetanus, diphtheria, and acellular pertussis (Tdap, Td) vaccine. You may need a Td booster every 10 years. Zoster vaccine. You may need this after age 5. Pneumococcal 13-valent conjugate (PCV13) vaccine. One dose is recommended after age 38. Pneumococcal polysaccharide (PPSV23) vaccine. One dose is recommended after age 53. Talk to your health care provider about which screenings and vaccines you need and how often you need them. This information is not intended to replace advice given to you by your health care provider. Make sure you discuss any questions you have with your health care provider. Document Released: 06/01/2015 Document Revised: 01/23/2016 Document Reviewed: 03/06/2015 Elsevier Interactive Patient Education  2017 [place] Prevention in the Home Falls can cause injuries. They can happen to people of all ages. There are many things you can do to make your home safe and to help prevent falls. What can I do on the outside of my home? Regularly fix the edges of walkways and driveways and fix any cracks. Remove anything that might make you trip as you walk through a door, such as a raised step or threshold. Trim any bushes or trees on the path to your home. Use bright outdoor lighting. Clear any walking paths of anything that might make someone trip, such as rocks or tools. Regularly check to see if handrails are loose or broken. Make sure that both sides of any steps have handrails. Any raised decks and porches should have guardrails on the edges. Have any leaves, snow, or ice cleared regularly. Use sand or salt on walking paths during winter. Clean up any spills in your garage right away. This includes oil or grease spills. What can I  do in the bathroom? Use night lights. Install grab bars by the toilet and in the tub and shower. Do not use towel bars as grab bars. Use non-skid mats or decals in the tub or shower. If you need to sit down in the shower, use a plastic, non-slip stool. Keep the floor dry. Clean up any water that spills on the floor as soon as it happens. Remove soap buildup in the tub or shower regularly. Attach bath mats securely with double-sided non-slip rug tape. Do not have throw rugs  and other things on the floor that can make you trip. What can I do in the bedroom? Use night lights. Make sure that you have a light by your bed that is easy to reach. Do not use any sheets or blankets that are too big for your bed. They should not hang down onto the floor. Have a firm chair that has side arms. You can use this for support while you get dressed. Do not have throw rugs and other things on the floor that can make you trip. What can I do in the kitchen? Clean up any spills right away. Avoid walking on wet floors. Keep items that you use a lot in easy-to-reach places. If you need to reach something above you, use a strong step stool that has a grab bar. Keep electrical cords out of the way. Do not use floor polish or wax that makes floors slippery. If you must use wax, use non-skid floor wax. Do not have throw rugs and other things on the floor that can make you trip. What can I do with my stairs? Do not leave any items on the stairs. Make sure that there are handrails on both sides of the stairs and use them. Fix handrails that are broken or loose. Make sure that handrails are as long as the stairways. Check any carpeting to make sure that it is firmly attached to the stairs. Fix any carpet that is loose or worn. Avoid having throw rugs at the top or bottom of the stairs. If you do have throw rugs, attach them to the floor with carpet tape. Make sure that you have a light switch at the top of the stairs  and the bottom of the stairs. If you do not have them, ask someone to add them for you. What else can I do to help prevent falls? Wear shoes that: Do not have high heels. Have rubber bottoms. Are comfortable and fit you well. Are closed at the toe. Do not wear sandals. If you use a stepladder: Make sure that it is fully opened. Do not climb a closed stepladder. Make sure that both sides of the stepladder are locked into place. Ask someone to hold it for you, if possible. Clearly mark and make sure that you can see: Any grab bars or handrails. First and last steps. Where the edge of each step is. Use tools that help you move around (mobility aids) if they are needed. These include: Canes. Walkers. Scooters. Crutches. Turn on the lights when you go into a dark area. Replace any light bulbs as soon as they burn out. Set up your furniture so you have a clear path. Avoid moving your furniture around. If any of your floors are uneven, fix them. If there are any pets around you, be aware of where they are. Review your medicines with your doctor. Some medicines can make you feel dizzy. This can increase your chance of falling. Ask your doctor what other things that you can do to help prevent falls. This information is not intended to replace advice given to you by your health care provider. Make sure you discuss any questions you have with your health care provider. Document Released: 03/01/2009 Document Revised: 10/11/2015 Document Reviewed: 06/09/2014 Elsevier Interactive Patient Education  2017 [employer].

## 2021-10-23 NOTE — Progress Notes (Signed)
Subjective:   [name] is a 81 y.o. female who presents for Medicare Annual (Subsequent) preventive examination.  Review of Systems    Virtual Visit via Telephone Note  I connected with  [name] on 10/23/21 at 11:30 AM EDT by telephone and verified that I am speaking with the correct person using two identifiers.  Location: Patient: Home Provider: Office Persons participating in the virtual visit: patient/Nurse Health Advisor   I discussed the limitations, risks, security and privacy concerns of performing an evaluation and management service by telephone and the availability of in person appointments. The patient expressed understanding and agreed to proceed.  Interactive audio and video telecommunications were attempted between this nurse and patient, however failed, due to patient having technical difficulties OR patient did not have access to video capability.  We continued and completed visit with audio only.  Some vital signs may be absent or patient reported.   Criselda Peaches, LPN  Cardiac Risk Factors include: advanced age (>71mn, >>16women);diabetes mellitus;hypertension     Objective:    Today's Vitals   10/23/21 1136  BP: (!) 155/94  Weight: 224 lb (101.6 kg)  Height: _0  (1.626 m)   Body mass index is 38.45 kg/m.     10/23/2021   12:02 PM 09/17/2021   11:13 AM 08/16/2021   11:01 AM 08/12/2021    6:30 AM 05/02/2021    3:14 PM 05/01/2021   11:47 AM 04/10/2021   10:31 AM  Advanced Directives  Does Patient Have a Medical Advance Directive? _1  Yes Yes  Type of [place] will [place] will [place] will [place] will Out of facility DNR (pink MOST or yellow form) Out of facility DNR (pink MOST or yellow form) Out of facility DNR (pink MOST or yellow form)  Does patient want to make changes to medical  advance directive? No - Patient declined    No - Patient declined No - Patient declined   Copy of [place] Chart? Yes - validated most recent copy scanned in chart (See row information)        Pre-existing out of facility DNR order (yellow form or pink MOST form)     Pink MOST form placed in chart (order not valid for inpatient use) Pink MOST form placed in chart (order not valid for inpatient use) Pink MOST form placed in chart (order not valid for inpatient use)    Current Medications (verified) Outpatient Encounter Medications as of 10/23/2021  Medication Sig   acetaminophen (TYLENOL) 500 MG tablet Take 500-1,000 mg by mouth every 6 (six) hours as needed for moderate pain.   albuterol (PROVENTIL) (2.5 MG/3ML) 0.083% nebulizer solution Take 3 mLs (2.5 mg total) by nebulization every 6 (six) hours as needed for wheezing.   albuterol (VENTOLIN HFA) 108 (90 Base) MCG/ACT inhaler Inhale 2 puffs into the lungs every 6 (six) hours as needed for wheezing or shortness of breath.   allopurinol (ZYLOPRIM) 300 MG tablet Take 1 tablet (300 mg total) by mouth daily.   Artificial Tear Ointment (DRY EYES OP) Place 1 drop into both eyes 2 (two) times daily as needed (dry eyes).   aspirin 325 MG tablet Take 1 tablet (325 mg total) by mouth daily.   atorvastatin (LIPITOR) 20 MG tablet Take 0.5 tablets (10 mg total) by mouth every evening.   Calcium Carb-Cholecalciferol (CALCIUM + D3  PO) Take 2 tablets by mouth daily with lunch.    Cholecalciferol (VITAMIN D-3) 25 MCG (1000 UT) CAPS Take 2 capsules (2,000 Units total) by mouth daily. GIVE ONE TAB PO Q DAY   colchicine 0.6 MG tablet Take 1 tablet (0.6 mg total) by mouth daily as needed (gout flares).   cyclobenzaprine (FLEXERIL) 5 MG tablet Take 1 tablet (5 mg total) by mouth at bedtime as needed for muscle spasms.   diclofenac Sodium (VOLTAREN) 1 % GEL Apply 2 g topically 3 (three) times daily. Apply to both hands and knees   Dulaglutide  (TRULICITY) 8.67 YP/9.5KD SOPN Inject 0.75 mg into the skin once a week. (Patient taking differently: Inject 0.75 mg into the skin every Wednesday.)   DULoxetine (CYMBALTA) 60 MG capsule Take 1 capsule (60 mg total) by mouth daily.   fluticasone (FLONASE) 50 MCG/ACT nasal spray Place 2 sprays into both nostrils daily.   furosemide (LASIX) 80 MG tablet Take 1 tablet (80 mg total) by mouth daily.   gabapentin (NEURONTIN) 600 MG tablet Take 2 tablets (1,200 mg total) by mouth 3 (three) times daily.   guaiFENesin (MUCINEX) 600 MG 12 hr tablet Take 2 tablets (1,200 mg total) by mouth 2 (two) times daily.   hydroxychloroquine (PLAQUENIL) 200 MG tablet Take 1 tablet (200 mg total) by mouth daily.   leflunomide (ARAVA) 20 MG tablet Take 1 tablet (20 mg total) by mouth daily.   levothyroxine (SYNTHROID) 50 MCG tablet Take 1 tablet (50 mcg total) by mouth daily.   linaclotide (LINZESS) 72 MCG capsule Take 1 capsule (72 mcg total) by mouth daily before breakfast.   metolazone (ZAROXOLYN) 2.5 MG tablet TAKE 1 TABLET BY MOUTH DAILY AS NEEDED FOR 2 DAYS FOR WEIGHT ABOVE 255 POUNDS   metoprolol succinate (TOPROL-XL) 25 MG 24 hr tablet Take 5 tablets (125 mg total) by mouth daily.   morphine (MS CONTIN) 15 MG 12 hr tablet Take 1 tablet (15 mg total) by mouth every 12 (twelve) hours. Do Not Fill Before 10/18/2021   morphine (MSIR) 15 MG tablet One tablet twice a day as needed for break through pain. Do Not Fill Before 10/18/2021   Multiple Vitamin (MULTIVITAMIN WITH MINERALS) TABS Take 1 tablet by mouth daily.    Nintedanib (OFEV) 100 MG CAPS Take 1 capsule (100 mg total) by mouth 2 (two) times daily.   olmesartan (BENICAR) 40 MG tablet Take 1 tablet (40 mg total) by mouth daily.   OXYGEN-HELIUM IN Inhale 3 L into the lungs at bedtime. And on exertion   pantoprazole (PROTONIX) 40 MG tablet take 1 tablet by mouth once daily MINUTES BEFORE 1ST MEAL OF THE DAY   Potassium Chloride ER 20 MEQ TBCR Take 20 mEq by mouth  daily.   predniSONE (DELTASONE) 10 MG tablet Take 1 tablet (10 mg total) by mouth daily with breakfast.   sodium chloride (OCEAN) 0.65 % SOLN nasal spray Place 1 spray into both nostrils as needed for congestion.   Facility-Administered Encounter Medications as of 10/23/2021  Medication   methylPREDNISolone acetate (DEPO-MEDROL) injection 80 mg    Allergies (verified) Codeine, Infliximab, and Lisinopril   History: Past Medical History:  Diagnosis Date   Acute on chronic diastolic (congestive) heart failure (HCC)    Anemia    iron deficiency anemia - secondary to blood loss ( chronic)    Anxiety    Arthritis    endstage changes bilateral knees/bilateral ankles.    Asthma    Carotid artery occlusion  Chronic fatigue    Chronic kidney disease    Closed left hip fracture (HCC)    Clotting disorder ([place])    pt denies this   Contusion of left knee    due to fall 1/14.   COPD (chronic obstructive pulmonary disease) (HCC)    pulmonary fibrosis   Depression, reactive    Diabetes mellitus    type II    Diastolic dysfunction    Difficulty in walking    Family history of heart disease    Generalized muscle weakness    Gout    High cholesterol    History of falling    Hypertension    Hypothyroidism    Interstitial lung disease ([place])    Meningioma of left sphenoid wing involving cavernous sinus ([place]) 02/17/2012   Continue diplopia, left eye pain and left headaches.     Morbid obesity ([place])    Neuromuscular disorder ([place])    diabetic neuropathy    Normal coronary arteries    cardiac catheterization performed  10/31/14   Pneumonia    RA (rheumatoid arthritis) ([place])    has been off methotreaxte since 10/13.   Spinal stenosis of lumbar region    Thyroid disease    Unspecified lack of coordination    URI (upper respiratory infection)    Past Surgical History:  Procedure Laterality Date   ABDOMINAL HYSTERECTOMY     BRAIN SURGERY     Gamma knife 10/13. Needs repeat spring   '14   CARDIAC CATHETERIZATION N/A 10/31/2014   Procedure: Right/Left Heart Cath and Coronary Angiography;  Surgeon: [name], MD;  Location: [facility];  Service: Cardiovascular;  Laterality: N/A;   CYST EXCISION     2 on face   CYST REMOVAL NECK     ESOPHAGOGASTRODUODENOSCOPY (EGD) WITH PROPOFOL N/A 09/14/2014   Procedure: ESOPHAGOGASTRODUODENOSCOPY (EGD) WITH PROPOFOL;  Surgeon: [name], MD;  Location: [facility];  Service: Endoscopy;  Laterality: N/A;   INCISION AND DRAINAGE HIP Left 01/16/2017   Procedure: IRRIGATION AND DEBRIDEMENT LEFT HIP;  Surgeon: [name], MD;  Location: [facility];  Service: Orthopedics;  Laterality: Left;   INTRAMEDULLARY (IM) NAIL INTERTROCHANTERIC Left 11/29/2016   Procedure: INTRAMEDULLARY (IM) NAIL INTERTROCHANTRIC;  Surgeon: [name], MD;  Location: [facility];  Service: Orthopedics;  Laterality: Left;   OVARY SURGERY     RIGHT HEART CATH N/A 08/12/2021   Procedure: RIGHT HEART CATH;  Surgeon: Lorretta Harp, MD;  Location: [facility];  Service: Cardiovascular;  Laterality: N/A;   SHOULDER SURGERY Left    TONSILLECTOMY  age 40   VIDEO BRONCHOSCOPY Bilateral 05/31/2013   Procedure: VIDEO BRONCHOSCOPY WITHOUT FLUORO;  Surgeon: Brand Males, MD;  Location: [facility];  Service: Cardiopulmonary;  Laterality: Bilateral;   video bronscoscopy  05/19/1998   lung   Family History  Problem Relation Age of Onset   Diabetes Mother    Heart attack Mother    Hypertension Father    Lung cancer Father    Diabetes Sister    Breast cancer Sister    Breast cancer Sister    Diabetes Brother    Hypertension Brother    Heart disease Brother    Heart attack Brother    Kidney cancer Brother    Gout Brother    Kidney failure Brother        x 5   Esophageal cancer Brother    Stomach cancer Brother    Prostate cancer Brother  Uterine cancer Daughter    Hypertension Daughter    Hypertension Daughter     Rheum arthritis Maternal Uncle    Hypertension Son    Heart murmur Son    Social History   Socioeconomic History   Marital status: Married    Spouse name: Not on file   Number of children: 6   Years of education: college   Highest education level: Not on file  Occupational History   Occupation: retired Marine scientist  Tobacco Use   Smoking status: Never   Smokeless tobacco: Never  Vaping Use   Vaping Use: Never used  Substance and Sexual Activity   Alcohol use: No   Drug use: No   Sexual activity: Not Currently  Other Topics Concern   Not on file  Social History Narrative   Patient consumes 2-3 cups coffee per day.   Social Determinants of Health   Financial Resource Strain: Low Risk    Difficulty of Paying Living Expenses: Not hard at all  Food Insecurity: No Food Insecurity   Worried About Charity fundraiser in the Last Year: Never true   [place] in the Last Year: Never true  Transportation Needs: No Transportation Needs   Lack of Transportation (Medical): No   Lack of Transportation (Non-Medical): No  Physical Activity: Insufficiently Active   Days of Exercise per Week: 2 days   Minutes of Exercise per Session: 40 min  Stress: Not on file  Social Connections: Socially Integrated   Frequency of Communication with Friends and Family: More than three times a week   Frequency of Social Gatherings with Friends and Family: More than three times a week   Attends Religious Services: More than 4 times per year   Active Member of [employer] or Organizations: Yes   Attends Music therapist: More than 4 times per year   Marital Status: Married     Clinical Intake:  Pre-visit preparation completed: NoNutrition Risk Assessment:  Has the patient had any N/V/D within the last 2 months?  No  Does the patient have any non-healing wounds?  No  Has the patient had any unintentional weight loss or weight gain?  No   Diabetes:  Is the patient diabetic?  Yes  If  diabetic, was a CBG obtained today?  Yes CBG 120 Taken by patient Did the patient bring in their glucometer from home?  No Audio Visit How often do you monitor your CBG's? Daily.   Financial Strains and Diabetes Management:  Are you having any financial strains with the device, your supplies or your medication? No .  Does the patient want to be seen by Chronic Care Management for management of their diabetes?  No  Would the patient like to be referred to a Nutritionist or for Diabetic Management?  No   Diabetic Exams:  Diabetic Eye Exam: Completed Yes. Overdue for diabetic eye exam. Pt has been advised about the importance in completing this exam. A referral has been placed today. Message sent to referral coordinator for scheduling purposes. Advised pt to expect a call from office referred to regarding appt.  Diabetic Foot Exam: Completed Yes. Pt has been advised about the importance in completing this exam. Pt is scheduled for diabetic foot exam on Followed by PCP.    Pain : No/denies painDiabetic?  Yes  Activities of Daily Living    10/23/2021   11:55 AM 03/30/2021    7:00 PM  In your present state of health, do  you have any difficulty performing the following activities:  Hearing? 0 0  Vision? 0 0  Difficulty concentrating or making decisions? 0 1  Walking or climbing stairs? 1 1  Comment Fx hip, spinal stenisis followed by PCP   Dressing or bathing? 1 1  Comment Aide assist   Doing errands, shopping?  1  Preparing Food and eating ? Y   Comment Aide assist   Using the Toilet? N   In the past six months, have you accidently leaked urine? Y   Comment Incontinence of urine. Followed by PCP   Do you have problems with loss of bowel control? N   Managing your Medications? N   Managing your Finances? N   Housekeeping or managing your Housekeeping? Y   Comment Aide assist     Patient Care Team: [name], MD as PCP - General (Family Medicine) Lorretta Harp, MD as  PCP - Cardiology (Cardiology) Duffy, Creola Corn, LCSW as Social Worker (Licensed Clinical Social Worker) Kidney, Nelida Meuse, [place], MD as Consulting Physician (Rheumatology) Efraim Kaufmann, NP (Rheumatology) Theodosia Blender, MD as Referring Physician (Pulmonary Disease)  Indicate any recent Medical Services you may have received from other than [facility] providers in the past year (date may be approximate).     Assessment:   This is a routine wellness examination for [name].  Hearing/Vision screen Hearing Screening - Comments:: No hearing difficulty Vision Screening - Comments:: Wears glasses. Followed by Dr Delman Cheadle  Dietary issues and exercise activities discussed: Exercise limited by: orthopedic condition(s) (Currently in Therapy)   Goals Addressed               This Visit's Progress     Patient stated (pt-stated)        I would like to Stand and move better       Depression Screen    10/23/2021   11:52 AM 09/25/2021    3:21 PM 08/19/2021   10:55 AM 07/22/2021   11:04 AM 06/21/2021   10:10 AM 05/22/2021    2:52 PM 03/27/2021   11:53 AM  PHQ 2/9 Scores  PHQ - 2 Score 0 2 3 0 1 2 0  PHQ- 9 Score 0 _0 Fall Risk    10/23/2021   12:01 PM 09/25/2021    3:20 PM 09/17/2021   11:13 AM 08/19/2021   10:54 AM 07/22/2021   11:04 AM  [place] in the past year? 0 0 0 0 0  Number falls in past yr: 0 0  0 0  Injury with Fall? 0 0  0   Risk for fall due to : No Fall Risks No Fall Risks  No Fall Risks   Follow up  Falls evaluation completed  Falls evaluation completed     [place]:  Any stairs in or around the home? Yes  If so, are there any without handrails? No  Home free of loose throw rugs in walkways, pet beds, electrical cords, etc? Yes  Adequate lighting in your home to reduce risk of falls? Yes   ASSISTIVE DEVICES UTILIZED TO PREVENT FALLS:  Life alert? No  Use of a cane, walker or w/c? Yes  Grab bars in the  bathroom? Yes  Shower chair or bench in shower? Yes  Elevated toilet seat or a handicapped toilet? Yes   TIMED UP AND GO:  Was the test performed? No . Audio Visit  Cognitive Function:      10/23/2021   12:02 PM  6CIT Screen  What Year? 0 points  What month? 0 points  What time? 0 points  Count back from 20 0 points  Months in reverse 0 points  Repeat phrase 0 points  Total Score 0 points    Immunizations Immunization History  Administered Date(s) Administered   Fluad Quad(high Dose 65+) 02/02/2019, 03/11/2021   Influenza Split 01/29/2012, 03/30/2013, 02/10/2014   Influenza, High Dose Seasonal PF 02/16/2017, 02/11/2018, 01/31/2019, 02/27/2020   Influenza,inj,Quad PF,6+ Mos 02/09/2015, 12/18/2015   Influenza-Unspecified 02/09/2015, 12/18/2015   PFIZER Comirnaty(Gray Top)Covid-19 Tri-Sucrose Vaccine 07/24/2019, 08/14/2019, 02/27/2020   PFIZER(Purple Top)SARS-COV-2 Vaccination 07/17/2019, 08/14/2019   PPD Test 04/23/2015   Pneumococcal Conjugate-13 08/21/2014   Pneumococcal Polysaccharide-23 02/17/2012   Tdap 12/27/2015    TDAP status: Up to date  Flu Vaccine status: Up to date  Pneumococcal vaccine status: Up to date  Covid-19 vaccine status: Declined, Education has been provided regarding the importance of this vaccine but patient still declined. Advised may receive this vaccine at local pharmacy or Health Dept.or vaccine clinic. Aware to provide a copy of the vaccination record if obtained from local pharmacy or Health Dept. Verbalized acceptance and understanding.  Qualifies for Shingles Vaccine? Yes   Zostavax completed No   Shingrix Completed?: No.    Education has been provided regarding the importance of this vaccine. Patient has been advised to call insurance company to determine out of pocket expense if they have not yet received this vaccine. Advised may also receive vaccine at local pharmacy or Health Dept. Verbalized acceptance and understanding.  Screening  Tests Health Maintenance  Topic Date Due   HEMOGLOBIN A1C  09/30/2021   FOOT EXAM  12/19/2021 (Originally 05/23/2020)   OPHTHALMOLOGY EXAM  12/19/2021 (Originally 03/15/2021)   COVID-19 Vaccine (6 - Booster) 12/19/2021 (Originally 04/23/2020)   Hepatitis C Screening  12/19/2021 (Originally 11/26/1958)   Zoster Vaccines- Shingrix (1 of 2) 12/19/2021 (Originally 11/25/1959)   INFLUENZA VACCINE  12/17/2021   TETANUS/TDAP  12/26/2025   Pneumonia Vaccine 52+ Years old  Completed   DEXA SCAN  Completed   HPV VACCINES  Aged Out    Health Maintenance  Health Maintenance Due  Topic Date Due   HEMOGLOBIN A1C  09/30/2021    Colorectal cancer screening: No longer required.   Mammogram status: No longer required due to Age.  Bone Density status: Completed 06/05/11. Results reflect: Bone density results: OSTEOPOROSIS. Repeat every   years.  Lung Cancer Screening: (Low Dose CT Chest recommended if Age 67-80 years, 30 pack-year currently smoking OR have quit w/in 15years.) does not qualify.     Additional Screening:  Hepatitis C Screening: does not qualify; Completed   Vision Screening: Recommended annual ophthalmology exams for early detection of glaucoma and other disorders of the eye. Is the patient up to date with their annual eye exam?  Yes  Who is the provider or what is the name of the office in which the patient attends annual eye exams? Dr Delman Cheadle If pt is not established with a provider, would they like to be referred to a provider to establish care? No .   Dental Screening: Recommended annual dental exams for proper oral hygiene  Community Resource Referral / Chronic Care Management:  CRR required this visit?  No   CCM required this visit?  No      Plan:     I have personally reviewed and noted the following in the patient's  chart:   Medical and social history Use of alcohol, tobacco or illicit drugs  Current medications and supplements including opioid prescriptions.   Functional ability and status Nutritional status Physical activity Advanced directives List of other physicians Hospitalizations, surgeries, and ER visits in previous 12 months Vitals Screenings to include cognitive, depression, and falls Referrals and appointments  In addition, I have reviewed and discussed with patient certain preventive protocols, quality metrics, and best practice recommendations. A written personalized care plan for preventive services as well as general preventive health recommendations were provided to patient.     Criselda Peaches, LPN   08/19/5954   Nurse Notes: None

## 2021-10-24 ENCOUNTER — Ambulatory Visit (INDEPENDENT_AMBULATORY_CARE_PROVIDER_SITE_OTHER): Payer: Medicare Other | Admitting: Rehabilitative and Restorative Service Providers"

## 2021-10-24 ENCOUNTER — Encounter: Payer: Self-pay | Admitting: Rehabilitative and Restorative Service Providers"

## 2021-10-24 DIAGNOSIS — R262 Difficulty in walking, not elsewhere classified: Secondary | ICD-10-CM | POA: Diagnosis not present

## 2021-10-24 DIAGNOSIS — M5459 Other low back pain: Secondary | ICD-10-CM

## 2021-10-24 DIAGNOSIS — R293 Abnormal posture: Secondary | ICD-10-CM | POA: Diagnosis not present

## 2021-10-24 DIAGNOSIS — M25552 Pain in left hip: Secondary | ICD-10-CM | POA: Diagnosis not present

## 2021-10-24 DIAGNOSIS — M6281 Muscle weakness (generalized): Secondary | ICD-10-CM

## 2021-10-24 NOTE — Therapy (Addendum)
OUTPATIENT PHYSICAL THERAPY TREATMENT NOTE   Patient Name: [name] MRN: [id number] DOB:1941/03/14, 81 y.o., female Today's Date: 10/24/2021  PCP: [name], MD REFERRING PROVIDER: Lorine Bears, NP   END OF SESSION:   PT End of Session - 10/24/21 1401     Visit Number 9    Number of Visits 20    Date for PT Re-Evaluation 10/25/21    Authorization Type Medicare and [facility]    Progress Note Due on Visit 98    PT Start Time 1355    PT Stop Time 1434    PT Time Calculation (min) 39 min    Activity Tolerance Patient limited by fatigue;Patient limited by pain    Behavior During Therapy WFL for tasks assessed/performed                    Past Medical History:  Diagnosis Date   Acute on chronic diastolic (congestive) heart failure (HCC)    Anemia    iron deficiency anemia - secondary to blood loss ( chronic)    Anxiety    Arthritis    endstage changes bilateral knees/bilateral ankles.    Asthma    Carotid artery occlusion    Chronic fatigue    Chronic kidney disease    Closed left hip fracture (HCC)    Clotting disorder ([place])    pt denies this   Contusion of left knee    due to fall 1/14.   COPD (chronic obstructive pulmonary disease) (HCC)    pulmonary fibrosis   Depression, reactive    Diabetes mellitus    type II    Diastolic dysfunction    Difficulty in walking    Family history of heart disease    Generalized muscle weakness    Gout    High cholesterol    History of falling    Hypertension    Hypothyroidism    Interstitial lung disease ([place])    Meningioma of left sphenoid wing involving cavernous sinus ([place]) 02/17/2012   Continue diplopia, left eye pain and left headaches.     Morbid obesity ([place])    Neuromuscular disorder ([place])    diabetic neuropathy    Normal coronary arteries    cardiac catheterization performed  10/31/14   Pneumonia    RA (rheumatoid arthritis) ([place])    has been off methotreaxte since 10/13.   Spinal  stenosis of lumbar region    Thyroid disease    Unspecified lack of coordination    URI (upper respiratory infection)    Past Surgical History:  Procedure Laterality Date   ABDOMINAL HYSTERECTOMY     BRAIN SURGERY     Gamma knife 10/13. Needs repeat spring  '14   CARDIAC CATHETERIZATION N/A 10/31/2014   Procedure: Right/Left Heart Cath and Coronary Angiography;  Surgeon: [name], MD;  Location: [facility];  Service: Cardiovascular;  Laterality: N/A;   CYST EXCISION     2 on face   CYST REMOVAL NECK     ESOPHAGOGASTRODUODENOSCOPY (EGD) WITH PROPOFOL N/A 09/14/2014   Procedure: ESOPHAGOGASTRODUODENOSCOPY (EGD) WITH PROPOFOL;  Surgeon: [name], MD;  Location: [facility];  Service: Endoscopy;  Laterality: N/A;   INCISION AND DRAINAGE HIP Left 01/16/2017   Procedure: IRRIGATION AND DEBRIDEMENT LEFT HIP;  Surgeon: [name], MD;  Location: [facility];  Service: Orthopedics;  Laterality: Left;   INTRAMEDULLARY (IM) NAIL INTERTROCHANTERIC Left 11/29/2016   Procedure: INTRAMEDULLARY (IM) NAIL INTERTROCHANTRIC;  Surgeon:  [name], MD;  Location: [facility];  Service: Orthopedics;  Laterality: Left;   OVARY SURGERY     RIGHT HEART CATH N/A 08/12/2021   Procedure: RIGHT HEART CATH;  Surgeon: Lorretta Harp, MD;  Location: [facility];  Service: Cardiovascular;  Laterality: N/A;   SHOULDER SURGERY Left    TONSILLECTOMY  age 9   VIDEO BRONCHOSCOPY Bilateral 05/31/2013   Procedure: VIDEO BRONCHOSCOPY WITHOUT FLUORO;  Surgeon: Brand Males, MD;  Location: [facility];  Service: Cardiopulmonary;  Laterality: Bilateral;   video bronscoscopy  05/19/1998   lung   Patient Active Problem List   Diagnosis Date Noted   Tachycardia 10/22/2021   Pulmonary HTN ([place])    Unspecified protein-calorie malnutrition ([place]) 04/05/2021   Pressure injury of skin 03/31/2021   UTI (urinary tract infection) 03/31/2021   Sepsis due to urinary tract  infection ([place]) 03/30/2021   Acute encephalopathy 03/30/2021   Thrombocytopenia ([place]) 03/30/2021   Diabetic neuropathy ([place]) 05/24/2019   Hypokalemia 04/21/2019   Anticoagulated 06/25/2018   Chronic pain 06/25/2018   CRI (chronic renal insufficiency), stage 3 (moderate) (HCC) 06/25/2018   Drug-induced constipation 06/18/2018   DVT (deep venous thrombosis) ([place]) 06/03/2018   Primary osteoarthritis of both knees 05/26/2018   Body mass index (BMI) of 50-59.9 in adult ([place]) 05/26/2018   Acquired hypothyroidism 05/26/2018   Chronic bilateral low back pain without sciatica 02/17/2018   Astigmatism with presbyopia, bilateral 03/26/2017   Cortical age-related cataract of both eyes 03/26/2017   Family history of glaucoma 03/26/2017   Long term current use of oral hypoglycemic drug 03/26/2017   Nuclear sclerotic cataract of both eyes 03/26/2017   Pain in left hip 02/18/2017   Closed nondisplaced intertrochanteric fracture of left femur with delayed healing 01/14/2017   Gout 11/29/2016   Depression 11/29/2016   Closed left hip fracture ([place]) 11/29/2016   GERD (gastroesophageal reflux disease) 11/29/2016   Closed intertrochanteric fracture of hip, left, initial encounter ([place]) 11/29/2016   Physical deconditioning 07/31/2015   Pulmonary fibrosis, postinflammatory ([place]) 07/31/2015   Bronchiectasis without complication ([place]) 41/63/8453   Tendinitis of left rotator cuff 06/13/2015   Chronic diastolic CHF (congestive heart failure) ([place]) 04/21/2015   Acute kidney injury superimposed on chronic kidney disease ([place]) 04/17/2015   Chronic respiratory failure with hypoxia ([place]) 02/09/2015   Dyspnea and respiratory abnormality 01/03/2015   Normal coronary arteries 11/02/2014   Morbid obesity ([place]) 10/30/2014   Dysphagia 08/21/2014   Chronic fatigue 06/21/2014   DOE (dyspnea on exertion) 05/24/2014   Primary osteoarthritis of left knee 04/26/2014   High risk medication use 04/17/2014   Aphasia  02/12/2014   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 ([place]) 01/18/2014   Lumbar facet arthropathy 01/18/2014   Bilateral edema of lower extremity 05/30/2013   Chronic cough 05/24/2013   ILD (interstitial lung disease) ([place]) 04/10/2013   Spinal stenosis of lumbar region with radiculopathy 07/12/2012   Inability to walk 07/12/2012   Meningioma of left sphenoid wing involving cavernous sinus ([place]) 02/17/2012   Chest pain of uncertain etiology 64/68/0321   Rheumatoid arthritis ([place]) 01/28/2012   Primary osteoarthritis of right knee 01/28/2012   Dyslipidemia    Thyroid disease    Essential hypertension     REFERRING PROVIDER: [name], MD   REFERRING DIAG: M54.50,G89.29 (ICD-10-CM) - Chronic bilateral low back pain without sciatica M25.552 (ICD-10-CM) - Pain in left hip  ONSET DATE:  Dec 2022  THERAPY DIAG:  Other low back pain  Pain  in left hip  Muscle weakness (generalized)  Difficulty in walking, not elsewhere classified  Abnormal posture  PERTINENT HISTORY: HTN, hypothyroidism, history of RA, COPD, DM, high cholesterol, CHF.  ORIF Lt femur from hip fracture "about 4-5 years ago"  PRECAUTIONS: May require supplemental oxygen  SUBJECTIVE: Pt indicated feeling 3/10 or so pain in back, "doing better".  Pt indicated seeing MD who pressed her to have her O2 and to keep track of her sat numbers better.   Injection in back was performed since last visit.   PAIN:  Are you having pain? Yes:  NPRS scale: 3/10 Pain location: Back pain Pain description: achy tightness, knee pain sharp Aggravating factors: insidious worsening, prolonged standing Relieving factors: Medicine  OBJECTIVE:    PATIENT SURVEYS:  10/08/2021: update 37 % 09/12/2021: update 43% 08/16/2021 FOTO intake: 33 predicted:  37   SCREENING FOR RED FLAGS: 08/16/2021 Bowel or bladder incontinence: No Cauda equina syndrome: No     COGNITION:            08/16/2021 Overall cognitive status: Within  functional limits for tasks assessed                                 SENSATION: 08/16/2021 Light touch: WFL   MUSCLE LENGTH: 08/16/2021:  none tested today   POSTURE:  08/16/2021 : rounded shoulders, increased thoracic kyphosis, reduced lumbar lordosis in standing, forward trunk lean noted   PALPATION: 08/16/2021 : Tenderness across lower back bilateral, Lt > Rt, Lateral Lt hip tenderness to touch- trigger points noted   LUMBAR ROM:    Active  AROM  08/16/2021 AROM 10/08/2021 AROM 10/24/2021  Flexion To mid thighs c pain    Extension To neutral, ERP lumbar noted.  X5 continued pain complaints at end range < 25 % but more than neutral c ERP noted   Right lateral flexion      Left lateral flexion      Right rotation      Left rotation       (Blank rows = not tested)   LE ROM:    PROM Right 09/02/2021 Left 09/02/2021 Right 10/16/2021 Left 10/16/2021  Hip flexion  95     Hip extension        Hip abduction        Hip adduction        Hip internal rotation  25 in 90 deg flexion 35 in 90 deg flexion  23 in 90 deg flexion seated 45 in 90 deg flexion seated  Hip external rotation  55 in 90 deg flexion 45 in 90 deg flexion  57 in 90 deg flexion seated  45 in 90 deg flexion seated (pain in Lt knee reported)  Knee flexion        Knee extension        Ankle dorsiflexion        Ankle plantarflexion        Ankle inversion        Ankle eversion         (Blank rows = not tested)   LE MMT:   MMT Right 08/16/2021 Left 08/16/2021 Right 10/08/2021 Left 10/08/2021  Hip flexion 4/5 3+/5 5/5 4/5  Hip extension      Hip abduction      Hip adduction        Hip internal rotation        Hip external rotation  Knee flexion 5/5 5/5    Knee extension 5/5 4/5    Ankle dorsiflexion 5/5 5/5    Ankle plantarflexion        Ankle inversion        Ankle eversion         (Blank rows = not tested)   LUMBAR SPECIAL TESTS:  08/16/2021 (-) slump test bilateral   FUNCTIONAL TESTS:  10/08/2021:  TUG  : c rollator 17.02 seconds   18 inch chair transfer s UE on 1st try  08/16/2021 TUG: c rollator 27.83 seconds   GAIT: 10/08/2021: 185 ft c rollator  09/10/2021:  Rollator 96 ft (stopped due to out of breath)  08/16/2021 Distance walked: Rollator household distances < 150 ft, modified independent                         Has wheelchair for community.        TODAY'S TREATMENT  10/21/2021 Therex:           Nustep UE/LE Lvl 5 10 mins  (monitoring O2 stats throughout c range from 88 to 96 % - cues given for deep breathing during).   Seated scapular retraction 5 second hold x 10  Standing green band rows c scapular retraction 2 x 10  Seated hip flexion c ipsilateral arm flexion x 10 bilateral  Supine bridge 3 sec hold x 15  Supine scapular retraction c GH extension in table 5 sec hold x 10  Seated cervical extension isometric 5 sec hold x 10     Rollator ambulation 100 ft c hands on handles instead of forearms.  Rest due to out of breath.  O2 sat was 92%.  Sitting and use of supplemental O2 raised to 97 with 2 mins rest.   10/16/2021 Therex:           Nustep UE/LE Lvl 5 10 mins   Seated scapular retraction 5 second hold x 10  Seated green band clam shell x 20 each(isometric hold of one while moving the other)  Seated tband rows 2 x 10 c scap retraction hold  Seated unsupported pball hip add squeeze 5 sec hold x 10    Standing gastroc /hip extension stretch at wall 15 sec x 1 bilateral(stopped due to difficulty)  Standing ankle strategy weight shift forward/back c cues for postural upright changes x 10 each way (limited due to pain/difficulty standing)   Review of HEP handout.     10/08/2021 Therex:           Nustep UE/LE Lvl 5 10 mins   Standing lumbar extension to neutral c scapular retraction x 5   Seated scapular retraction 5 second hold x 10  Leg press Double leg 37 lbs 2 x 15  TherActivity ( performed to improve functional movement tolerance and progressive mobility  performance)  TUG c rollator x 1  18 inch chair sit to stand c slow lowering x 5  Ambulation c rollator to fatigue 185 ft c SBA (to improve cardiovascular endurance)    PATIENT EDUCATION:  10/16/2021 Education details: HEP update Person educated: Patient Education method: Consulting civil engineer, Demonstration, Verbal cues, and Handouts Education comprehension: verbalized understanding and returned demonstration     HOME EXERCISE PROGRAM: Access Code: [email or web address] URL: [email or web address] Date: 10/16/2021 Prepared by: Scot Jun  Exercises - Supine Lower Trunk Rotation  - 2-3 x daily - 7 x weekly - 1 sets - 2 reps - 15 hold - Hooklying Single Knee  to Chest Stretch  - 2-3 x daily - 7 x weekly - 1 sets - 2 reps - 15 hold - Seated Scapular Retraction  - 2-3 x daily - 7 x weekly - 1 sets - 5-10 reps - 3-5 hold - Seated Hip Abduction with Resistance  - 1-2 x daily - 7 x weekly - 1-2 sets - 10-15 reps - Seated March with Same Side Arm Raise   - 1-2 x daily - 7 x weekly - 1-2 sets - 10 reps   ASSESSMENT:   CLINICAL IMPRESSION: Limitations in endurance and tolerance to upright posture continue overall.  Pt has been reluctant to perform supine activity due to history of back pain, limiting some posterior chain activation but attempted today.  Posterior chain activation/strengthening to help aid upright posture tolerance and endurance.  Continued skilled PT services indicated to address impairments.     OBJECTIVE IMPAIRMENTS Abnormal gait, decreased activity tolerance, decreased balance, decreased coordination, decreased endurance, decreased mobility, difficulty walking, decreased ROM, decreased strength, hypomobility, impaired perceived functional ability, increased muscle spasms, impaired flexibility, improper body mechanics, postural dysfunction, obesity, and pain.    ACTIVITY LIMITATIONS cleaning, community activity, driving, meal prep, and laundry.    PERSONAL FACTORS 3+  comorbidities: HTN, hypothyroidism, history of RA, COPD, DM, high cholesterol, CHF  are also affecting patient's functional outcome.      REHAB POTENTIAL: Fair to good   CLINICAL DECISION MAKING: Evolving/moderate complexity   EVALUATION COMPLEXITY: Moderate     GOALS: Goals reviewed with patient? Yes   Short term PT Goals (target date for Short term goals are 3 weeks 09/06/2021) Patient will demonstrate independent use of home exercise program to maintain progress from in clinic treatments. Goal status: MET - assessed 09/10/2021   Long term PT goals (target dates for all long term goals are 10 weeks  10/25/2021 )   1. Patient will demonstrate/report pain at worst less than or equal to 2/10 to facilitate minimal limitation in daily activity secondary to pain symptoms. Goal status: On Going - assessed 10/08/2021  2. Patient will demonstrate independent use of home exercise program to facilitate ability to maintain/progress functional gains from skilled physical therapy services. Goal status: On Going - assessed 10/08/2021   3. Patient will demonstrate FOTO outcome > or = 37 % to indicate reduced disability due to condition. Goal status: MET- assessed 10/08/2021   4.  Patient will demonstrate TUG c rollator < 14 seconds to indicate reduced fall risk and improved community integration level.  Goal status: On Going - assessed 10/08/2021   5.  Patient will demonstrate lumbar ext AROM > or = 50% to facilitate upright standing/walking posture for household and community intergration.    Goal status: On Going - assessed 10/08/2021   6.  Patient will demonstrate bilateral knee MMT 5/5 throughout, Lt hip MMT 4/5 or greater throughout to facilitate daily movement at [facility].   Goal status: On Going - assessed 10/08/2021   7.  Patient will demonstrate ability to walk > 500 ft s resting to facilitate ability to ambulation in community.  a.  Goal Status: On Going - assessed 10/08/2021     PLAN: PT  FREQUENCY: 2x/week   PT DURATION: 10 weeks   PLANNED INTERVENTIONS: Therapeutic exercises, Therapeutic activity, Neuro Muscular re-education, Balance training, Gait training, Patient/Family education, Joint mobilization, Stair training, DME instructions, Dry Needling, Electrical stimulation, Cryotherapy, Moist heat, Taping, Ultrasound, Ionotophoresis 49m/ml Dexamethasone, and Manual therapy.  All included unless contraindicated.   PLAN FOR  NEXT SESSION: Recert information including pain assessment, walking distance, strength, goal analysis and revisions.    Scot Jun, PT, DPT, OCS, ATC 10/24/21  2:35 PM

## 2021-10-28 ENCOUNTER — Encounter: Payer: Self-pay | Admitting: Physician Assistant

## 2021-10-28 ENCOUNTER — Ambulatory Visit (INDEPENDENT_AMBULATORY_CARE_PROVIDER_SITE_OTHER): Payer: Medicare Other | Admitting: Physician Assistant

## 2021-10-28 VITALS — BP 147/99 | HR 88 | Ht 64.0 in | Wt 223.0 lb

## 2021-10-28 DIAGNOSIS — I5032 Chronic diastolic (congestive) heart failure: Secondary | ICD-10-CM

## 2021-10-28 DIAGNOSIS — R000 Tachycardia, unspecified: Secondary | ICD-10-CM

## 2021-10-28 DIAGNOSIS — I272 Pulmonary hypertension, unspecified: Secondary | ICD-10-CM | POA: Diagnosis not present

## 2021-10-28 DIAGNOSIS — I10 Essential (primary) hypertension: Secondary | ICD-10-CM

## 2021-10-28 DIAGNOSIS — E785 Hyperlipidemia, unspecified: Secondary | ICD-10-CM | POA: Diagnosis not present

## 2021-10-28 DIAGNOSIS — R0902 Hypoxemia: Secondary | ICD-10-CM

## 2021-10-28 DIAGNOSIS — R079 Chest pain, unspecified: Secondary | ICD-10-CM | POA: Diagnosis not present

## 2021-10-28 NOTE — Patient Instructions (Signed)
Medication Instructions:  No changes *If you need a refill on your cardiac medications before your next appointment, please call your pharmacy*   Lab Work: D-Dimer,CBC,BMET If you have labs (blood work) drawn today and your tests are completely normal, you will receive your results only by: [place] (if you have MyChart) OR A paper copy in the mail If you have any lab test that is abnormal or we need to change your treatment, we will call you to review the results.   Testing/Procedures: No Testing   Follow-Up: At [facility], you and your health needs are our priority.  As part of our continuing mission to provide you with exceptional heart care, we have created designated Provider Care Teams.  These Care Teams include your primary Cardiologist (physician) and Advanced Practice Providers (APPs -  Physician Assistants and Nurse Practitioners) who all work together to provide you with the care you need, when you need it.  We recommend signing up for the patient portal called "MyChart".  Sign up information is provided on this After Visit Summary.  MyChart is used to connect with patients for Virtual Visits (Telemedicine).  Patients are able to view lab/test results, encounter notes, upcoming appointments, etc.  Non-urgent messages can be sent to your provider as well.   To learn more about what you can do with MyChart, go to [email or web address].    Your next appointment:   3 month(s)  The format for your next appointment:   In Person  Provider:   Quay Burow, MD       Important Information About Sugar

## 2021-10-28 NOTE — Progress Notes (Addendum)
Cardiology Office Note:    Date:  10/28/2021   ID:  [name], DOB 1940-06-03, MRN [id number]  PCP:  [name], MD   [facility] Providers Cardiologist:  Quay Burow, MD     Referring MD: [name], MD   Chief Complaint  Patient presents with   Follow-up    Sinus tachycardia    History of Present Illness:    [name] is a 81 y.o. female with a hx of HTN, chronic diastolic heart failure, pulmonary hypertension, chronic respiratory failure with hypoxia, ILD, DM 2, RA, meningioma with prior gamma knife therapy, and normal coronaries by heart cath in 2016.  She has been maintained on p.o. diuretics for pulmonary hypertension and diastolic dysfunction.  She follows with Renan Danese for ILD.  She follows with [name] who requested a right heart catheterization to further characterize her pulmonary hypertension.  This was completed on 08/12/2021.  RHC showed moderately elevated right atrial pressure and mildly elevated pulmonary artery pressure.  Her PA saturations were 58%.  In follow-up with pulmonology, she did not qualify for inhaled treprostinil based on who group 3.  She was started on nintedanib as an antifibrotic.   She returns today for cardiology follow up.  She is on 80 mg Lasix daily with as needed metolazone.  She is here to evaluate sinus tachycardia noted at her recent pulmonology visit.   EKG reviewed and I agree sinus tachycardia HR 118.    She presents with her daughter [name].  She was hospitalized at the end of last year with worsening hypoxia.  She was sent to rehab following that hospitalization where unfortunately she contracted COVID-19.  Since her COVID infection, she has not recovered fully.  Changes on her lung exam seen in CT chest.  Followed by pulmonology.  She states that she has had worsening joint pain and allover body pain.  Her RA is apparently worsening and there are no other medications for her to take.  She states that she  has had to increase her oxygen over the past month and feels more short of breath.  She is also reporting intermittent chest pain.  She is sedentary at baseline.  She reports feeling heart racing several times per week.  She has not had syncopal episode.  She is working with physical therapy twice a week and reports more fatigue.  She also reports increased fatigue with her episodes of heart racing.  Heart racing can last several minutes.  Upon review of her medications, she is taking 125 mg of Toprol daily.  She also recounts a remote history of a lower extremity DVT felt secondary to an RA medication she was on many years ago.  She is no longer on anticoagulation, but is apparently taking 325 mg aspirin.  She states that overall, she has had worsening dyspnea and has needed to turn up her O2 over the last month with intermittent chest pain and heart racing.   Past Medical History:  Diagnosis Date   Acute on chronic diastolic (congestive) heart failure (HCC)    Anemia    iron deficiency anemia - secondary to blood loss ( chronic)    Anxiety    Arthritis    endstage changes bilateral knees/bilateral ankles.    Asthma    Carotid artery occlusion    Chronic fatigue    Chronic kidney disease    Closed left hip fracture (HCC)    Clotting disorder ([place])    pt denies  this   Contusion of left knee    due to fall 1/14.   COPD (chronic obstructive pulmonary disease) (HCC)    pulmonary fibrosis   Depression, reactive    Diabetes mellitus    type II    Diastolic dysfunction    Difficulty in walking    Family history of heart disease    Generalized muscle weakness    Gout    High cholesterol    History of falling    Hypertension    Hypothyroidism    Interstitial lung disease ([place])    Meningioma of left sphenoid wing involving cavernous sinus ([place]) 02/17/2012   Continue diplopia, left eye pain and left headaches.     Morbid obesity ([place])    Neuromuscular disorder ([place])    diabetic  neuropathy    Normal coronary arteries    cardiac catheterization performed  10/31/14   Pneumonia    RA (rheumatoid arthritis) ([place])    has been off methotreaxte since 10/13.   Spinal stenosis of lumbar region    Thyroid disease    Unspecified lack of coordination    URI (upper respiratory infection)     Past Surgical History:  Procedure Laterality Date   ABDOMINAL HYSTERECTOMY     BRAIN SURGERY     Gamma knife 10/13. Needs repeat spring  '14   CARDIAC CATHETERIZATION N/A 10/31/2014   Procedure: Right/Left Heart Cath and Coronary Angiography;  Surgeon: [name], MD;  Location: [facility];  Service: Cardiovascular;  Laterality: N/A;   CYST EXCISION     2 on face   CYST REMOVAL NECK     ESOPHAGOGASTRODUODENOSCOPY (EGD) WITH PROPOFOL N/A 09/14/2014   Procedure: ESOPHAGOGASTRODUODENOSCOPY (EGD) WITH PROPOFOL;  Surgeon: [name], MD;  Location: [facility];  Service: Endoscopy;  Laterality: N/A;   INCISION AND DRAINAGE HIP Left 01/16/2017   Procedure: IRRIGATION AND DEBRIDEMENT LEFT HIP;  Surgeon: [name], MD;  Location: [facility];  Service: Orthopedics;  Laterality: Left;   INTRAMEDULLARY (IM) NAIL INTERTROCHANTERIC Left 11/29/2016   Procedure: INTRAMEDULLARY (IM) NAIL INTERTROCHANTRIC;  Surgeon: [name], MD;  Location: [facility];  Service: Orthopedics;  Laterality: Left;   OVARY SURGERY     RIGHT HEART CATH N/A 08/12/2021   Procedure: RIGHT HEART CATH;  Surgeon: Lorretta Harp, MD;  Location: [facility];  Service: Cardiovascular;  Laterality: N/A;   SHOULDER SURGERY Left    TONSILLECTOMY  age 51   VIDEO BRONCHOSCOPY Bilateral 05/31/2013   Procedure: VIDEO BRONCHOSCOPY WITHOUT FLUORO;  Surgeon: Brand Males, MD;  Location: [facility];  Service: Cardiopulmonary;  Laterality: Bilateral;   video bronscoscopy  05/19/1998   lung    Current Medications: Current Meds  Medication Sig   acetaminophen (TYLENOL) 500 MG  tablet Take 500-1,000 mg by mouth every 6 (six) hours as needed for moderate pain.   albuterol (PROVENTIL) (2.5 MG/3ML) 0.083% nebulizer solution Take 3 mLs (2.5 mg total) by nebulization every 6 (six) hours as needed for wheezing.   albuterol (VENTOLIN HFA) 108 (90 Base) MCG/ACT inhaler Inhale 2 puffs into the lungs every 6 (six) hours as needed for wheezing or shortness of breath.   allopurinol (ZYLOPRIM) 300 MG tablet Take 1 tablet (300 mg total) by mouth daily.   Artificial Tear Ointment (DRY EYES OP) Place 1 drop into both eyes 2 (two) times daily as needed (dry eyes).   aspirin 325 MG tablet Take 1 tablet (325 mg total) by mouth daily.  atorvastatin (LIPITOR) 20 MG tablet Take 0.5 tablets (10 mg total) by mouth every evening. (Patient taking differently: Take 20 mg by mouth every evening. Patient is taking half of a 29m tablet)   Calcium Carb-Cholecalciferol (CALCIUM + D3 PO) Take 2 tablets by mouth daily with lunch.    Cholecalciferol (VITAMIN D-3) 25 MCG (1000 UT) CAPS Take 2 capsules (2,000 Units total) by mouth daily. GIVE ONE TAB PO Q DAY   colchicine 0.6 MG tablet Take 1 tablet (0.6 mg total) by mouth daily as needed (gout flares).   cyclobenzaprine (FLEXERIL) 5 MG tablet Take 1 tablet (5 mg total) by mouth at bedtime as needed for muscle spasms.   diclofenac Sodium (VOLTAREN) 1 % GEL Apply 2 g topically 3 (three) times daily. Apply to both hands and knees   Dulaglutide (TRULICITY) 04.25MZD/6.3OVSOPN Inject 0.75 mg into the skin once a week. (Patient taking differently: Inject 0.75 mg into the skin every Wednesday.)   DULoxetine (CYMBALTA) 60 MG capsule Take 1 capsule (60 mg total) by mouth daily.   fluticasone (FLONASE) 50 MCG/ACT nasal spray Place 2 sprays into both nostrils daily.   furosemide (LASIX) 80 MG tablet Take 1 tablet (80 mg total) by mouth daily.   gabapentin (NEURONTIN) 600 MG tablet Take 2 tablets (1,200 mg total) by mouth 3 (three) times daily.   guaiFENesin (MUCINEX)  600 MG 12 hr tablet Take 2 tablets (1,200 mg total) by mouth 2 (two) times daily.   hydroxychloroquine (PLAQUENIL) 200 MG tablet Take 1 tablet (200 mg total) by mouth daily.   leflunomide (ARAVA) 20 MG tablet Take 1 tablet (20 mg total) by mouth daily.   levothyroxine (SYNTHROID) 50 MCG tablet Take 1 tablet (50 mcg total) by mouth daily.   linaclotide (LINZESS) 72 MCG capsule Take 1 capsule (72 mcg total) by mouth daily before breakfast.   metolazone (ZAROXOLYN) 2.5 MG tablet TAKE 1 TABLET BY MOUTH DAILY AS NEEDED FOR 2 DAYS FOR WEIGHT ABOVE 255 POUNDS   metoprolol succinate (TOPROL-XL) 25 MG 24 hr tablet Take 5 tablets (125 mg total) by mouth daily.   morphine (MS CONTIN) 15 MG 12 hr tablet Take 1 tablet (15 mg total) by mouth every 12 (twelve) hours. Do Not Fill Before 10/18/2021   morphine (MSIR) 15 MG tablet One tablet twice a day as needed for break through pain. Do Not Fill Before 10/18/2021   Multiple Vitamin (MULTIVITAMIN WITH MINERALS) TABS Take 1 tablet by mouth daily.    Nintedanib (OFEV) 100 MG CAPS Take 1 capsule (100 mg total) by mouth 2 (two) times daily.   olmesartan (BENICAR) 40 MG tablet Take 1 tablet (40 mg total) by mouth daily.   OXYGEN-HELIUM IN Inhale 3 L into the lungs at bedtime. And on exertion   pantoprazole (PROTONIX) 40 MG tablet take 1 tablet by mouth once daily MINUTES BEFORE 1ST MEAL OF THE DAY   Potassium Chloride ER 20 MEQ TBCR Take 20 mEq by mouth daily.   predniSONE (DELTASONE) 10 MG tablet Take 1 tablet (10 mg total) by mouth daily with breakfast.   sodium chloride (OCEAN) 0.65 % SOLN nasal spray Place 1 spray into both nostrils as needed for congestion.   Current Facility-Administered Medications for the 10/28/21 encounter (Office Visit) with DLedora Bottcher PA  Medication   methylPREDNISolone acetate (DEPO-MEDROL) injection 80 mg     Allergies:   Codeine, Infliximab, and Lisinopril   Social History   Socioeconomic History   Marital status:  Married  Spouse name: Not on file   Number of children: 6   Years of education: college   Highest education level: Not on file  Occupational History   Occupation: retired Marine scientist  Tobacco Use   Smoking status: Never   Smokeless tobacco: Never  Vaping Use   Vaping Use: Never used  Substance and Sexual Activity   Alcohol use: No   Drug use: No   Sexual activity: Not Currently  Other Topics Concern   Not on file  Social History Narrative   Patient consumes 2-3 cups coffee per day.   Social Determinants of Health   Financial Resource Strain: Low Risk  (10/23/2021)   Overall Financial Resource Strain (CARDIA)    Difficulty of Paying Living Expenses: Not hard at all  Food Insecurity: No Food Insecurity (10/23/2021)   Hunger Vital Sign    Worried About Running Out of Food in the Last Year: Never true    Ran Out of Food in the Last Year: Never true  Transportation Needs: No Transportation Needs (10/23/2021)   PRAPARE - Hydrologist (Medical): No    Lack of Transportation (Non-Medical): No  Physical Activity: Insufficiently Active (10/23/2021)   Exercise Vital Sign    Days of Exercise per Week: 2 days    Minutes of Exercise per Session: 40 min  Stress: No Stress Concern Present (10/18/2020)   [place]    Feeling of Stress : Not at all  Social Connections: [place] (10/23/2021)   Social Connection and Isolation Panel [NHANES]    Frequency of Communication with Friends and Family: More than three times a week    Frequency of Social Gatherings with Friends and Family: More than three times a week    Attends Religious Services: More than 4 times per year    Active Member of [employer] or Organizations: Yes    Attends Music therapist: More than 4 times per year    Marital Status: Married     Family History: The patient's family history includes Breast cancer in her sister and  sister; Diabetes in her brother, mother, and sister; Esophageal cancer in her brother; Gout in her brother; Heart attack in her brother and mother; Heart disease in her brother; Heart murmur in her son; Hypertension in her brother, daughter, daughter, father, and son; Kidney cancer in her brother; Kidney failure in her brother; Lung cancer in her father; Prostate cancer in her brother; Rheum arthritis in her maternal uncle; Stomach cancer in her brother; Uterine cancer in her daughter.  ROS:   Please see the history of present illness.     All other systems reviewed and are negative.  EKGs/Labs/Other Studies Reviewed:    The following studies were reviewed today:  Right heart cath 2023: 1: Right atrial pressure-20/18 2: Right ventricular pressure-37/17, mean 22 3: Pulmonary artery pressure-43/19, mean 30 4: Pulmonary wedge pressure-A-wave 23, V wave 20, mean 22 5: Cardiac output-4.97 L/min with an index of 2.43 L/min/m 6: PVR-1.81     IMPRESSION: Ms. Berti has moderately elevated right atrial pressures and mildly elevated pulmonary artery pressures.  Her PA saturations were 58%.  The sheath was removed and a bandage was placed over the right antecubital puncture site.  The patient left the lab in stable condition.  She will be discharged home later this morning and to follow-up with [name] and myself.  EKG:  EKG is  ordered today.  The  ekg ordered today demonstrates sinus rhythm HR 84  Recent Labs: 04/01/2021: B Natriuretic Peptide 25.2 05/22/2021: Magnesium 1.8 09/25/2021: ALT 24; BUN 17; Creatinine, Ser 0.87; Hemoglobin 11.9; Platelets 153.0; Potassium 4.0; Pro B Natriuretic peptide (BNP) 10.0; Sodium 144; TSH 1.33  Recent Lipid Panel    Component Value Date/Time   CHOL 136 09/26/2020 1355   CHOL 150 05/26/2018 1259   TRIG 283.0 (H) 09/26/2020 1355   HDL 40.00 09/26/2020 1355   HDL 65 05/26/2018 1259   CHOLHDL 3 09/26/2020 1355   VLDL 56.6 (H) 09/26/2020 1355   LDLCALC  75 09/22/2019 1144   LDLCALC 63 05/26/2018 1259   LDLDIRECT 59.0 09/26/2020 1355     Risk Assessment/Calculations:           Physical Exam:    VS:  BP (!) 147/99   Pulse 88   Ht _0  (1.626 m)   Wt 223 lb (101.2 kg)   SpO2 97%   BMI 38.28 kg/m     Wt Readings from Last 3 Encounters:  10/28/21 223 lb (101.2 kg)  10/23/21 224 lb (101.6 kg)  10/21/21 225 lb 3.2 oz (102.2 kg)     GEN:  Well nourished, well developed in no acute distress HEENT: Normal NECK: No JVD LYMPHATICS: No lymphadenopathy CARDIAC: RRR, no murmurs, rubs, gallops RESPIRATORY:  lungs sound tight  ABDOMEN: Soft, non-tender, non-distended MUSCULOSKELETAL:  No edema; No deformity  SKIN: Warm and dry NEUROLOGIC:  Alert and oriented x 3 PSYCHIATRIC:  Normal affect   ASSESSMENT:    1. Tachycardia   2. Essential hypertension   3. Hypoxia   4. Chest pain of uncertain etiology   5. Primary hypertension   6. Chronic diastolic CHF (congestive heart failure) ([place])   7. Pulmonary HTN ([place])   8. Dyslipidemia    PLAN:    In order of problems listed above:  Worsening hypoxia Sinus tachycardia Chest pain I do not have an explanation for her sinus tachycardia, although she is in a significant amount of pain and fatigue since COVID. She also has chronic pain but is specifically mentioning chest pain over the last month. Given her chest pain, worsening hypoxia, and history of DVT, I think we need to rule out PE.  I will first obtain a D-dimer.  If that is positive we will send for CTA.  Recent labs with normal renal function. If this is negative for PE, would place a heart monitor to evaluate tachycardia.  EKG today appears sinus rhythm. She has several reasons to have low energy, shortness of breath, and tachycardia including worsening ILD since COVID-19 infection and worsening joint pain from advancing RA. Nevertheless, will rule out PE.    Hypertension PTA 40 mg olmesartan Mildly hypertensive today, will  make no medication changes at this time   Chronic diastolic heart failure Pulmonary hypertension - 80 mg lasix daily - PRN metolazone   Hyperlipidemia  Taking 0.5 tablets of lipitor - consider switching this to 10 mg tablets at next visit   High dose ASA - 325 mg ASA - unclear why she is on this   Medication Adjustments/Labs and Tests Ordered: Current medicines are reviewed at length with the patient today.  Concerns regarding medicines are outlined above.  Orders Placed This Encounter  Procedures   D-dimer, quantitative   Basic metabolic panel   Magnesium   CBC   EKG 12-Lead   No orders of the defined types were placed in this encounter.   Patient Instructions  Medication Instructions:  No changes *If you need a refill on your cardiac medications before your next appointment, please call your pharmacy*   Lab Work: D-Dimer,CBC,BMET If you have labs (blood work) drawn today and your tests are completely normal, you will receive your results only by: [place] (if you have MyChart) OR A paper copy in the mail If you have any lab test that is abnormal or we need to change your treatment, we will call you to review the results.   Testing/Procedures: No Testing   Follow-Up: At [facility], you and your health needs are our priority.  As part of our continuing mission to provide you with exceptional heart care, we have created designated Provider Care Teams.  These Care Teams include your primary Cardiologist (physician) and Advanced Practice Providers (APPs -  Physician Assistants and Nurse Practitioners) who all work together to provide you with the care you need, when you need it.  We recommend signing up for the patient portal called "MyChart".  Sign up information is provided on this After Visit Summary.  MyChart is used to connect with patients for Virtual Visits (Telemedicine).  Patients are able to view lab/test results, encounter notes, upcoming  appointments, etc.  Non-urgent messages can be sent to your provider as well.   To learn more about what you can do with MyChart, go to [email or web address].    Your next appointment:   3 month(s)  The format for your next appointment:   In Person  Provider:   Quay Burow, MD       Important Information About Sugar         Signed, Ledora Bottcher, [place]  10/28/2021 5:10 PM    [place]

## 2021-10-29 DIAGNOSIS — I10 Essential (primary) hypertension: Secondary | ICD-10-CM | POA: Diagnosis not present

## 2021-10-29 DIAGNOSIS — R000 Tachycardia, unspecified: Secondary | ICD-10-CM | POA: Diagnosis not present

## 2021-10-29 DIAGNOSIS — I272 Pulmonary hypertension, unspecified: Secondary | ICD-10-CM | POA: Diagnosis not present

## 2021-10-30 ENCOUNTER — Ambulatory Visit (HOSPITAL_BASED_OUTPATIENT_CLINIC_OR_DEPARTMENT_OTHER)
Admission: RE | Admit: 2021-10-30 | Discharge: 2021-10-30 | Disposition: A | Discharge status: Home / Self Care | Payer: Medicare Other | Source: Ambulatory Visit | Attending: Physician Assistant | Admitting: Physician Assistant

## 2021-10-30 ENCOUNTER — Other Ambulatory Visit: Payer: Self-pay

## 2021-10-30 ENCOUNTER — Telehealth: Payer: Self-pay | Admitting: Physician Assistant

## 2021-10-30 ENCOUNTER — Other Ambulatory Visit: Payer: Self-pay | Admitting: Physician Assistant

## 2021-10-30 ENCOUNTER — Other Ambulatory Visit: Payer: Self-pay | Admitting: *Deleted

## 2021-10-30 DIAGNOSIS — R072 Precordial pain: Secondary | ICD-10-CM | POA: Diagnosis not present

## 2021-10-30 DIAGNOSIS — I2694 Multiple subsegmental pulmonary emboli without acute cor pulmonale: Secondary | ICD-10-CM

## 2021-10-30 DIAGNOSIS — J8489 Other specified interstitial pulmonary diseases: Secondary | ICD-10-CM

## 2021-10-30 DIAGNOSIS — I2699 Other pulmonary embolism without acute cor pulmonale: Secondary | ICD-10-CM | POA: Diagnosis not present

## 2021-10-30 LAB — BASIC METABOLIC PANEL
BUN/Creatinine Ratio: 21 (ref 12–28)
BUN: 18 mg/dL (ref 8–27)
CO2: 27 mmol/L (ref 20–29)
Calcium: 8.6 mg/dL — ABNORMAL LOW (ref 8.7–10.3)
Chloride: 100 mmol/L (ref 96–106)
Creatinine, Ser: 0.84 mg/dL (ref 0.57–1.00)
Glucose: 138 mg/dL — ABNORMAL HIGH (ref 70–99)
Potassium: 3.6 mmol/L (ref 3.5–5.2)
Sodium: 145 mmol/L — ABNORMAL HIGH (ref 134–144)
eGFR: 71 mL/min/{1.73_m2} (ref >59)

## 2021-10-30 LAB — CBC
Hematocrit: 38.1 % (ref 34.0–46.6)
Hemoglobin: 12.2 g/dL (ref 11.1–15.9)
MCH: 30.6 pg (ref 26.6–33.0)
MCHC: 32 g/dL (ref 31.5–35.7)
MCV: 96 fL (ref 79–97)
Platelets: 144 10*3/uL — ABNORMAL LOW (ref 150–450)
RBC: 3.99 x10E6/uL (ref 3.77–5.28)
RDW: 13.3 % (ref 11.7–15.4)
WBC: 6.9 10*3/uL (ref 3.4–10.8)

## 2021-10-30 LAB — D-DIMER, QUANTITATIVE: D-DIMER: 4.22 mg/L FEU — ABNORMAL HIGH (ref 0.00–0.49)

## 2021-10-30 LAB — MAGNESIUM: Magnesium: 2.3 mg/dL (ref 1.6–2.3)

## 2021-10-30 MED ORDER — IOHEXOL 350 MG/ML SOLN
100.0000 mL | Freq: Once | INTRAVENOUS | Status: AC | PRN
  Administered 2021-10-30: 100 mL via INTRAVENOUS

## 2021-10-30 MED ORDER — APIXABAN (ELIQUIS) VTE STARTER PACK (10MG AND 5MG): ORAL_TABLET | ORAL | 0 refills | Status: DC

## 2021-10-30 NOTE — Telephone Encounter (Signed)
Received a call from [facility] calling to report CT of chest positive for PE. Ordering physician made aware.

## 2021-10-30 NOTE — Telephone Encounter (Signed)
Pt presented with sinus tachycardia on 125 mg toprol, intermittent chest discomfort, shortness of breath with increasing O2 needs. I opted to rule out PE given her history of DVT. D-dimer was positive, leading to CTA which showed acute right-sided segmental and subsegmental PE's.  I called the patient and discussed results. Pt was stable without worsening symptoms. Since symptoms have been going on for the last month, I opted to treat outpatient with treatment dose eliquis. Pt's daugher confirmed no history of major bleeding, including GI or brain bleeding. CBC without anemia.   She will return to clinic in 2-3 weeks with echocardiogram to evaluate right sided strain.    Ledora Bottcher, PA-C 10/30/2021, 5:03 PM [place] [address] [place], [place] [id number]

## 2021-10-30 NOTE — Telephone Encounter (Signed)
[facility], Dr. Lenis Noon wanting to speak to provider about this patient's CT.

## 2021-10-30 NOTE — Telephone Encounter (Signed)
Ruby from [facility] called requesting to speak with ordering physician. Called transferred to PA

## 2021-10-30 NOTE — Telephone Encounter (Signed)
Follow Up:   Ruby called back and said she needs to talk to  [facility] please.

## 2021-10-30 NOTE — Telephone Encounter (Signed)
Verbal order from Angie PA received for echocardiogram. Order placed, message sent to scheduler. Patient contacted and notified of order, which needs to be done next week. Angie PA also advised f/u with her or Gwenlyn Found MD in 2 weeks. This was scheduled for 11/05/21 with Gwenlyn Found MD

## 2021-10-30 NOTE — Telephone Encounter (Signed)
Thanks for update

## 2021-11-05 ENCOUNTER — Other Ambulatory Visit (INDEPENDENT_AMBULATORY_CARE_PROVIDER_SITE_OTHER): Payer: Medicare Other

## 2021-11-05 ENCOUNTER — Encounter: Payer: Self-pay | Admitting: Rehabilitative and Restorative Service Providers"

## 2021-11-05 ENCOUNTER — Ambulatory Visit (INDEPENDENT_AMBULATORY_CARE_PROVIDER_SITE_OTHER): Payer: Medicare Other | Admitting: Rehabilitative and Restorative Service Providers"

## 2021-11-05 DIAGNOSIS — M6281 Muscle weakness (generalized): Secondary | ICD-10-CM

## 2021-11-05 DIAGNOSIS — R262 Difficulty in walking, not elsewhere classified: Secondary | ICD-10-CM

## 2021-11-05 DIAGNOSIS — M5459 Other low back pain: Secondary | ICD-10-CM

## 2021-11-05 DIAGNOSIS — G8929 Other chronic pain: Secondary | ICD-10-CM | POA: Diagnosis not present

## 2021-11-05 DIAGNOSIS — R293 Abnormal posture: Secondary | ICD-10-CM | POA: Diagnosis not present

## 2021-11-05 DIAGNOSIS — M359 Systemic involvement of connective tissue, unspecified: Secondary | ICD-10-CM

## 2021-11-05 DIAGNOSIS — J8489 Other specified interstitial pulmonary diseases: Secondary | ICD-10-CM | POA: Diagnosis not present

## 2021-11-05 DIAGNOSIS — M5442 Lumbago with sciatica, left side: Secondary | ICD-10-CM

## 2021-11-05 LAB — HEPATIC FUNCTION PANEL
ALT: 26 U/L (ref 0–35)
AST: 19 U/L (ref 0–37)
Albumin: 3.4 g/dL — ABNORMAL LOW (ref 3.5–5.2)
Alkaline Phosphatase: 66 U/L (ref 39–117)
Bilirubin, Direct: 0.1 mg/dL (ref 0.0–0.3)
Total Bilirubin: 0.4 mg/dL (ref 0.2–1.2)
Total Protein: 5.6 g/dL — ABNORMAL LOW (ref 6.0–8.3)

## 2021-11-05 NOTE — Therapy (Signed)
OUTPATIENT PHYSICAL THERAPY TREATMENT NOTE   Patient Name: [name] MRN: [id number] DOB:09-23-1940, 81 y.o., female Today's Date: 11/05/2021  PCP: Martyn Ehrich, NP REFERRING PROVIDER: [name], MD  Progress Note Reporting Period 08/16/2021 to 11/05/2021  See note below for Objective Data and Assessment of Progress/Goals.     END OF SESSION:   PT End of Session - 11/05/21 1437     Visit Number 10    Number of Visits 20    Date for PT Re-Evaluation 10/25/21    Authorization Type Medicare and [facility]    Progress Note Due on Visit 48    PT Start Time 1430    PT Stop Time 1515    PT Time Calculation (min) 45 min    Activity Tolerance Patient limited by fatigue;Patient tolerated treatment well    Behavior During Therapy WFL for tasks assessed/performed              Past Medical History:  Diagnosis Date   Acute on chronic diastolic (congestive) heart failure (HCC)    Anemia    iron deficiency anemia - secondary to blood loss ( chronic)    Anxiety    Arthritis    endstage changes bilateral knees/bilateral ankles.    Asthma    Carotid artery occlusion    Chronic fatigue    Chronic kidney disease    Closed left hip fracture (HCC)    Clotting disorder ([place])    pt denies this   Contusion of left knee    due to fall 1/14.   COPD (chronic obstructive pulmonary disease) (HCC)    pulmonary fibrosis   Depression, reactive    Diabetes mellitus    type II    Diastolic dysfunction    Difficulty in walking    Family history of heart disease    Generalized muscle weakness    Gout    High cholesterol    History of falling    Hypertension    Hypothyroidism    Interstitial lung disease ([place])    Meningioma of left sphenoid wing involving cavernous sinus ([place]) 02/17/2012   Continue diplopia, left eye pain and left headaches.     Morbid obesity ([place])    Neuromuscular disorder ([place])    diabetic neuropathy    Normal coronary arteries    cardiac  catheterization performed  10/31/14   Pneumonia    RA (rheumatoid arthritis) ([place])    has been off methotreaxte since 10/13.   Spinal stenosis of lumbar region    Thyroid disease    Unspecified lack of coordination    URI (upper respiratory infection)    Past Surgical History:  Procedure Laterality Date   ABDOMINAL HYSTERECTOMY     BRAIN SURGERY     Gamma knife 10/13. Needs repeat spring  '14   CARDIAC CATHETERIZATION N/A 10/31/2014   Procedure: Right/Left Heart Cath and Coronary Angiography;  Surgeon: [name], MD;  Location: [facility];  Service: Cardiovascular;  Laterality: N/A;   CYST EXCISION     2 on face   CYST REMOVAL NECK     ESOPHAGOGASTRODUODENOSCOPY (EGD) WITH PROPOFOL N/A 09/14/2014   Procedure: ESOPHAGOGASTRODUODENOSCOPY (EGD) WITH PROPOFOL;  Surgeon: [name], MD;  Location: [facility];  Service: Endoscopy;  Laterality: N/A;   INCISION AND DRAINAGE HIP Left 01/16/2017   Procedure: IRRIGATION AND DEBRIDEMENT LEFT HIP;  Surgeon: [name], MD;  Location: [facility];  Service: Orthopedics;  Laterality: Left;  INTRAMEDULLARY (IM) NAIL INTERTROCHANTERIC Left 11/29/2016   Procedure: INTRAMEDULLARY (IM) NAIL INTERTROCHANTRIC;  Surgeon: [name], MD;  Location: [facility];  Service: Orthopedics;  Laterality: Left;   OVARY SURGERY     RIGHT HEART CATH N/A 08/12/2021   Procedure: RIGHT HEART CATH;  Surgeon: Lorretta Harp, MD;  Location: [facility];  Service: Cardiovascular;  Laterality: N/A;   SHOULDER SURGERY Left    TONSILLECTOMY  age 89   VIDEO BRONCHOSCOPY Bilateral 05/31/2013   Procedure: VIDEO BRONCHOSCOPY WITHOUT FLUORO;  Surgeon: Brand Males, MD;  Location: [facility];  Service: Cardiopulmonary;  Laterality: Bilateral;   video bronscoscopy  05/19/1998   lung   Patient Active Problem List   Diagnosis Date Noted   Tachycardia 10/22/2021   Pulmonary HTN ([place])    Unspecified protein-calorie malnutrition  ([place]) 04/05/2021   Pressure injury of skin 03/31/2021   UTI (urinary tract infection) 03/31/2021   Sepsis due to urinary tract infection ([place]) 03/30/2021   Acute encephalopathy 03/30/2021   Thrombocytopenia ([place]) 03/30/2021   Diabetic neuropathy ([place]) 05/24/2019   Hypokalemia 04/21/2019   Anticoagulated 06/25/2018   Chronic pain 06/25/2018   CRI (chronic renal insufficiency), stage 3 (moderate) (HCC) 06/25/2018   Drug-induced constipation 06/18/2018   DVT (deep venous thrombosis) ([place]) 06/03/2018   Primary osteoarthritis of both knees 05/26/2018   Body mass index (BMI) of 50-59.9 in adult ([place]) 05/26/2018   Acquired hypothyroidism 05/26/2018   Chronic bilateral low back pain without sciatica 02/17/2018   Astigmatism with presbyopia, bilateral 03/26/2017   Cortical age-related cataract of both eyes 03/26/2017   Family history of glaucoma 03/26/2017   Long term current use of oral hypoglycemic drug 03/26/2017   Nuclear sclerotic cataract of both eyes 03/26/2017   Pain in left hip 02/18/2017   Closed nondisplaced intertrochanteric fracture of left femur with delayed healing 01/14/2017   Gout 11/29/2016   Depression 11/29/2016   Closed left hip fracture ([place]) 11/29/2016   GERD (gastroesophageal reflux disease) 11/29/2016   Closed intertrochanteric fracture of hip, left, initial encounter ([place]) 11/29/2016   Physical deconditioning 07/31/2015   Pulmonary fibrosis, postinflammatory ([place]) 07/31/2015   Bronchiectasis without complication ([place]) 94/58/5929   Tendinitis of left rotator cuff 06/13/2015   Chronic diastolic CHF (congestive heart failure) ([place]) 04/21/2015   Acute kidney injury superimposed on chronic kidney disease ([place]) 04/17/2015   Chronic respiratory failure with hypoxia ([place]) 02/09/2015   Dyspnea and respiratory abnormality 01/03/2015   Normal coronary arteries 11/02/2014   Morbid obesity ([place]) 10/30/2014   Dysphagia 08/21/2014   Chronic fatigue 06/21/2014   DOE (dyspnea  on exertion) 05/24/2014   Primary osteoarthritis of left knee 04/26/2014   High risk medication use 04/17/2014   Aphasia 02/12/2014   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 ([place]) 01/18/2014   Lumbar facet arthropathy 01/18/2014   Bilateral edema of lower extremity 05/30/2013   Chronic cough 05/24/2013   ILD (interstitial lung disease) ([place]) 04/10/2013   Spinal stenosis of lumbar region with radiculopathy 07/12/2012   Inability to walk 07/12/2012   Meningioma of left sphenoid wing involving cavernous sinus ([place]) 02/17/2012   Chest pain of uncertain etiology 24/46/2863   Rheumatoid arthritis ([place]) 01/28/2012   Primary osteoarthritis of right knee 01/28/2012   Dyslipidemia    Thyroid disease    Essential hypertension     REFERRING PROVIDER: [name], MD   REFERRING DIAG: M54.50,G89.29 (ICD-10-CM) - Chronic bilateral low back pain without sciatica M25.552 (ICD-10-CM) - Pain in left hip  ONSET DATE:  Dec 2022  THERAPY DIAG:  Other low back pain  Muscle weakness (generalized)  Difficulty in walking, not elsewhere classified  Abnormal posture  Chronic left-sided low back pain with left-sided sciatica  PERTINENT HISTORY: HTN, hypothyroidism, history of RA, COPD, DM, high cholesterol, CHF.  ORIF Lt femur from hip fracture "about 4-5 years ago"  PRECAUTIONS: May require supplemental oxygen  SUBJECTIVE: Liv notes back and B knee pain still limits function.  She does note better endurance and she is walking more with the walker than before starting PT.  Standing endurance is also improved.  She can also put her stockings on independently.  All this is in direct opposition to her declining FOTO score.  PAIN:  Are you having pain? Yes:  NPRS scale: 4-5/10 Pain location: Back, knee and hip pain Pain description: achy tightness, knee pain sharp Aggravating factors: insidious worsening, prolonged standing Relieving factors: Medicine  OBJECTIVE:    PATIENT  SURVEYS:  11/05/2021: update 32 10/08/2021: update 37 % 09/12/2021: update 43% 08/16/2021 FOTO intake: 33 predicted:  37   SCREENING FOR RED FLAGS: 08/16/2021 Bowel or bladder incontinence: No Cauda equina syndrome: No     COGNITION:            08/16/2021 Overall cognitive status: Within functional limits for tasks assessed                                 SENSATION: 08/16/2021 Light touch: WFL   MUSCLE LENGTH: 08/16/2021:  none tested today   POSTURE:  08/16/2021 : rounded shoulders, increased thoracic kyphosis, reduced lumbar lordosis in standing, forward trunk lean noted   PALPATION: 08/16/2021 : Tenderness across lower back bilateral, Lt > Rt, Lateral Lt hip tenderness to touch- trigger points noted   LUMBAR ROM:    Active  AROM  08/16/2021 AROM 10/08/2021 AROM 11/05/2021  Flexion To mid thighs c pain    Extension To neutral, ERP lumbar noted.  X5 continued pain complaints at end range < 25 % but more than neutral c ERP noted Can get to neutral with knees straight  Right lateral flexion      Left lateral flexion      Right rotation      Left rotation       (Blank rows = not tested)   LE ROM:    PROM Right 09/02/2021 Left 09/02/2021 Right 10/16/2021 Left 10/16/2021  Hip flexion  95     Hip extension        Hip abduction        Hip adduction        Hip internal rotation  25 in 90 deg flexion 35 in 90 deg flexion  23 in 90 deg flexion seated 45 in 90 deg flexion seated  Hip external rotation  55 in 90 deg flexion 45 in 90 deg flexion  57 in 90 deg flexion seated  45 in 90 deg flexion seated (pain in Lt knee reported)  Knee flexion        Knee extension        Ankle dorsiflexion        Ankle plantarflexion        Ankle inversion        Ankle eversion         (Blank rows = not tested)   LE MMT:   MMT Right 08/16/2021 Left 08/16/2021 Right 10/08/2021 Left 10/08/2021  Hip flexion  4/5 3+/5 5/5 4/5  Hip extension      Hip abduction      Hip adduction        Hip internal  rotation        Hip external rotation        Knee flexion 5/5 5/5    Knee extension 5/5 4/5    Ankle dorsiflexion 5/5 5/5    Ankle plantarflexion        Ankle inversion        Ankle eversion         (Blank rows = not tested)   LUMBAR SPECIAL TESTS:  08/16/2021 (-) slump test bilateral   FUNCTIONAL TESTS:  10/08/2021:  TUG : c rollator 17.02 seconds   18 inch chair transfer s UE on 1st try  08/16/2021 TUG: c rollator 27.83 seconds   GAIT: 10/08/2021: 185 ft c rollator  09/10/2021:  Rollator 96 ft (stopped due to out of breath)  08/16/2021 Distance walked: Rollator household distances < 150 ft, modified independent                         Has wheelchair for community.        TODAY'S TREATMENT  11/05/2021 Trunk extension AROM/Shoulder blade pinch/Sit to stand combination 5X  Opposite arm and leg in sitting 10X 5 seconds B 2 sets  Pelvic rotation 10X 10 seconds (uncomfortable supine on the firm treatment table)  Bridging (attempted)  Functional Activities: gait mechanics and endurance work with the walker 3X 30-50 feet with rest breaks PRN Progress Note   10/21/2021 Therex:           Nustep UE/LE Lvl 5 10 mins  (monitoring O2 stats throughout c range from 88 to 96 % - cues given for deep breathing during).   Seated scapular retraction 5 second hold x 10  Standing green band rows c scapular retraction 2 x 10  Seated hip flexion c ipsilateral arm flexion x 10 bilateral  Supine bridge 3 sec hold x 15  Supine scapular retraction c GH extension in table 5 sec hold x 10  Seated cervical extension isometric 5 sec hold x 10     Rollator ambulation 100 ft c hands on handles instead of forearms.  Rest due to out of breath.  O2 sat was 92%.  Sitting and use of supplemental O2 raised to 97 with 2 mins rest.    10/16/2021 Therex:           Nustep UE/LE Lvl 5 10 mins   Seated scapular retraction 5 second hold x 10  Seated green band clam shell x 20 each(isometric hold of one while  moving the other)  Seated tband rows 2 x 10 c scap retraction hold  Seated unsupported pball hip add squeeze 5 sec hold x 10    Standing gastroc /hip extension stretch at wall 15 sec x 1 bilateral(stopped due to difficulty)  Standing ankle strategy weight shift forward/back c cues for postural upright changes x 10 each way (limited due to pain/difficulty standing)   Review of HEP handout.       PATIENT EDUCATION:  10/16/2021 Education details: HEP update Person educated: Patient Education method: Explanation, Demonstration, Verbal cues, and Handouts Education comprehension: verbalized understanding and returned demonstration     HOME EXERCISE PROGRAM: Access Code: [email or web address] URL: [email or web address] Date: 10/16/2021 Prepared by: Scot Jun  Exercises - Supine Lower Trunk Rotation  - 2-3 x daily - 7 x  weekly - 1 sets - 2 reps - 15 hold - Hooklying Single Knee to Chest Stretch  - 2-3 x daily - 7 x weekly - 1 sets - 2 reps - 15 hold - Seated Scapular Retraction  - 2-3 x daily - 7 x weekly - 1 sets - 5-10 reps - 3-5 hold - Seated Hip Abduction with Resistance  - 1-2 x daily - 7 x weekly - 1-2 sets - 10-15 reps - Seated March with Same Side Arm Raise   - 1-2 x daily - 7 x weekly - 1-2 sets - 10 reps   ASSESSMENT:   CLINICAL IMPRESSION: Desarai notes significant subjective progress with her physical therapy.  She has better endurance (also noted by PT), she is walking more with the walker than before starting PT, and standing endurance has improved.  She can also put her stockings on independently.  This stands in direct opposition to her declining FOTO score.  Carylon will benefit from additional strength, endurance, gait and functional work before transfer into more independent rehabilitation.      OBJECTIVE IMPAIRMENTS Abnormal gait, decreased activity tolerance, decreased balance, decreased coordination, decreased endurance, decreased mobility, difficulty walking,  decreased ROM, decreased strength, hypomobility, impaired perceived functional ability, increased muscle spasms, impaired flexibility, improper body mechanics, postural dysfunction, obesity, and pain.    ACTIVITY LIMITATIONS cleaning, community activity, driving, meal prep, and laundry.    PERSONAL FACTORS 3+ comorbidities: HTN, hypothyroidism, history of RA, COPD, DM, high cholesterol, CHF  are also affecting patient's functional outcome.      REHAB POTENTIAL: Fair to good   CLINICAL DECISION MAKING: Evolving/moderate complexity   EVALUATION COMPLEXITY: Moderate     GOALS: Goals reviewed with patient? Yes   Short term PT Goals (target date for Short term goals are 3 weeks 09/06/2021) Patient will demonstrate independent use of home exercise program to maintain progress from in clinic treatments. Goal status: MET - assessed 09/10/2021   Long term PT goals (target dates for all long term goals are 10 weeks  11/22/2021 )   1. Patient will demonstrate/report pain at worst less than or equal to 2/10 to facilitate minimal limitation in daily activity secondary to pain symptoms. Goal status: On Going - assessed 11/05/2021  2. Patient will demonstrate independent use of home exercise program to facilitate ability to maintain/progress functional gains from skilled physical therapy services. Goal status: Met- assessed 11/05/2021   3. Patient will demonstrate FOTO outcome > or = 37 % to indicate reduced disability due to condition. Goal status: On Going- assessed 11/05/2021   4.  Patient will demonstrate TUG c rollator < 14 seconds to indicate reduced fall risk and improved community integration level.  Goal status: On Going - (40 seconds with walker) assessed 11/05/2021   5.  Patient will demonstrate lumbar ext AROM > or = 50% to facilitate upright standing/walking posture for household and community intergration.    Goal status: On Going - assessed 11/05/2021   6.  Patient will demonstrate  bilateral knee MMT 5/5 throughout, Lt hip MMT 4/5 or greater throughout to facilitate daily movement at [facility].   Goal status: On Going - assessed 11/05/2021   7.  Patient will demonstrate ability to walk > 500 ft s resting to facilitate ability to ambulation in community.  a.  Goal Status: On Going - assessed 11/05/2021     PLAN: PT FREQUENCY: 2x/week   PT DURATION: 4 weeks   PLANNED INTERVENTIONS: Therapeutic exercises, Therapeutic activity, Neuro Muscular re-education,  Balance training, Gait training, Patient/Family education, Joint mobilization, Stair training, DME instructions, Dry Needling, Electrical stimulation, Cryotherapy, Moist heat, Taping, Ultrasound, Ionotophoresis 34m/ml Dexamethasone, and Manual therapy.  All included unless contraindicated.   PLAN FOR NEXT SESSION: Continue work on sitting posture, frequent changes of position, standing endurance, walking distance, LE and core strength, goal analysis and revisions PRN.    RFarley LyPT, MPT 11/05/21  5:13 PM

## 2021-11-05 NOTE — Addendum Note (Signed)
Addended by: Suzzanne Cloud E on: 11/05/2021 12:44 PM   Modules accepted: Orders

## 2021-11-06 ENCOUNTER — Ambulatory Visit (HOSPITAL_COMMUNITY)
Admission: RE | Admit: 2021-11-06 | Discharge: 2021-11-06 | Disposition: A | Discharge status: Home / Self Care | Payer: Medicare Other | Source: Ambulatory Visit | Attending: Physician Assistant | Admitting: Physician Assistant

## 2021-11-06 DIAGNOSIS — I517 Cardiomegaly: Secondary | ICD-10-CM | POA: Insufficient documentation

## 2021-11-06 DIAGNOSIS — J449 Chronic obstructive pulmonary disease, unspecified: Secondary | ICD-10-CM | POA: Diagnosis not present

## 2021-11-06 DIAGNOSIS — I2694 Multiple subsegmental pulmonary emboli without acute cor pulmonale: Secondary | ICD-10-CM | POA: Diagnosis not present

## 2021-11-06 DIAGNOSIS — I10 Essential (primary) hypertension: Secondary | ICD-10-CM | POA: Diagnosis not present

## 2021-11-06 DIAGNOSIS — E119 Type 2 diabetes mellitus without complications: Secondary | ICD-10-CM | POA: Insufficient documentation

## 2021-11-06 LAB — ECHOCARDIOGRAM COMPLETE
Area-P 1/2: 4.6 cm2
Calc EF: 54.2 %
S' Lateral: 2.8 cm
Single Plane A2C EF: 52.7 %
Single Plane A4C EF: 59.1 %

## 2021-11-07 ENCOUNTER — Encounter: Payer: Medicare Other | Admitting: Rehabilitative and Restorative Service Providers"

## 2021-11-08 NOTE — Procedures (Signed)
Lumbosacral Transforaminal Epidural Steroid Injection - Sub-Pedicular Approach with Fluoroscopic Guidance  Patient: [name]      Date of Birth: 1940-08-06 MRN: [id number] PCP: Glenford Bayley, NP      Visit Date: 10/22/2021   Universal Protocol:    Date/Time: 10/22/2021  Consent Given By: the patient  Position: PRONE  Additional Comments: Vital signs were monitored before and after the procedure. Patient was prepped and draped in the usual sterile fashion. The correct patient, procedure, and site was verified.   Injection Procedure Details:   Procedure diagnoses: Lumbar radiculopathy [M54.16]    Meds Administered:  Meds ordered this encounter  Medications   methylPREDNISolone acetate (DEPO-MEDROL) injection 80 mg    Laterality: Bilateral  Location/Site: L4  Needle:5.0 in., 22 ga.  Short bevel or Quincke spinal needle  Needle Placement: Transforaminal  Findings:    -Comments: Excellent flow of contrast along the left nerve, nerve root into the epidural space.  There was stenotic but good flow contrast along the right nerve and nerve root and epidural space.  Procedure Details: After squaring off the end-plates to get a true AP view, the C-arm was positioned so that an oblique view of the foramen as noted above was visualized. The target area is just inferior to the "nose of the scotty dog" or sub pedicular. The soft tissues overlying this structure were infiltrated with 2-3 ml. of 1% Lidocaine without Epinephrine.  The spinal needle was inserted toward the target using a "trajectory" view along the fluoroscope beam.  Under AP and lateral visualization, the needle was advanced so it did not puncture dura and was located close the 6 O'Clock position of the pedical in AP tracterory. Biplanar projections were used to confirm position. Aspiration was confirmed to be negative for CSF and/or blood. A 1-2 ml. volume of Isovue-250 was injected and flow of contrast was  noted at each level. Radiographs were obtained for documentation purposes.   After attaining the desired flow of contrast documented above, a 0.5 to 1.0 ml test dose of 0.25% Marcaine was injected into each respective transforaminal space.  The patient was observed for 90 seconds post injection.  After no sensory deficits were reported, and normal lower extremity motor function was noted,   the above injectate was administered so that equal amounts of the injectate were placed at each foramen (level) into the transforaminal epidural space.   Additional Comments:  The patient tolerated the procedure well Dressing: 2 x 2 sterile gauze and Band-Aid    Post-procedure details: Patient was observed during the procedure. Post-procedure instructions were reviewed.  Patient left the clinic in stable condition.

## 2021-11-11 ENCOUNTER — Ambulatory Visit (INDEPENDENT_AMBULATORY_CARE_PROVIDER_SITE_OTHER): Payer: Medicare Other | Admitting: Rehabilitative and Restorative Service Providers"

## 2021-11-11 ENCOUNTER — Encounter: Payer: Self-pay | Admitting: Rehabilitative and Restorative Service Providers"

## 2021-11-11 DIAGNOSIS — R262 Difficulty in walking, not elsewhere classified: Secondary | ICD-10-CM

## 2021-11-11 DIAGNOSIS — R293 Abnormal posture: Secondary | ICD-10-CM | POA: Diagnosis not present

## 2021-11-11 DIAGNOSIS — M6281 Muscle weakness (generalized): Secondary | ICD-10-CM

## 2021-11-11 DIAGNOSIS — M5459 Other low back pain: Secondary | ICD-10-CM

## 2021-11-11 DIAGNOSIS — M25552 Pain in left hip: Secondary | ICD-10-CM

## 2021-11-11 NOTE — Therapy (Addendum)
OUTPATIENT PHYSICAL THERAPY TREATMENT NOTE   Patient Name: [name] MRN: [id number] DOB:[birth year], 81 y.o., female Today's Date: 11/11/2021  PCP: Martyn Ehrich, NP REFERRING PROVIDER: [name], MD    END OF SESSION:   PT End of Session - 11/11/21 1434     Visit Number 11    Number of Visits 20    Date for PT Re-Evaluation 01/06/22    Authorization Type Medicare and [facility] at 15    Progress Note Due on Visit 20    PT Start Time 1425    PT Stop Time 1505    PT Time Calculation (min) 40 min    Activity Tolerance Patient limited by fatigue;Patient limited by pain    Behavior During Therapy WFL for tasks assessed/performed               Past Medical History:  Diagnosis Date   Acute on chronic diastolic (congestive) heart failure (HCC)    Anemia    iron deficiency anemia - secondary to blood loss ( chronic)    Anxiety    Arthritis    endstage changes bilateral knees/bilateral ankles.    Asthma    Carotid artery occlusion    Chronic fatigue    Chronic kidney disease    Closed left hip fracture (HCC)    Clotting disorder ([place])    pt denies this   Contusion of left knee    due to fall 1/14.   COPD (chronic obstructive pulmonary disease) (HCC)    pulmonary fibrosis   Depression, reactive    Diabetes mellitus    type II    Diastolic dysfunction    Difficulty in walking    Family history of heart disease    Generalized muscle weakness    Gout    High cholesterol    History of falling    Hypertension    Hypothyroidism    Interstitial lung disease ([place])    Meningioma of left sphenoid wing involving cavernous sinus ([place]) 02/17/2012   Continue diplopia, left eye pain and left headaches.     Morbid obesity ([place])    Neuromuscular disorder ([place])    diabetic neuropathy    Normal coronary arteries    cardiac catheterization performed  10/31/14   Pneumonia    RA (rheumatoid arthritis) ([place])    has been off methotreaxte since 10/13.    Spinal stenosis of lumbar region    Thyroid disease    Unspecified lack of coordination    URI (upper respiratory infection)    Past Surgical History:  Procedure Laterality Date   ABDOMINAL HYSTERECTOMY     BRAIN SURGERY     Gamma knife 10/13. Needs repeat spring  '14   CARDIAC CATHETERIZATION N/A 10/31/2014   Procedure: Right/Left Heart Cath and Coronary Angiography;  Surgeon: [name], MD;  Location: [facility];  Service: Cardiovascular;  Laterality: N/A;   CYST EXCISION     2 on face   CYST REMOVAL NECK     ESOPHAGOGASTRODUODENOSCOPY (EGD) WITH PROPOFOL N/A 09/14/2014   Procedure: ESOPHAGOGASTRODUODENOSCOPY (EGD) WITH PROPOFOL;  Surgeon: [name], MD;  Location: [facility];  Service: Endoscopy;  Laterality: N/A;   INCISION AND DRAINAGE HIP Left 01/16/2017   Procedure: IRRIGATION AND DEBRIDEMENT LEFT HIP;  Surgeon: [name], MD;  Location: [facility];  Service: Orthopedics;  Laterality: Left;   INTRAMEDULLARY (IM) NAIL INTERTROCHANTERIC Left 11/29/2016   Procedure: INTRAMEDULLARY (IM) NAIL INTERTROCHANTRIC;  Surgeon:  [name], MD;  Location: [facility];  Service: Orthopedics;  Laterality: Left;   OVARY SURGERY     RIGHT HEART CATH N/A 08/12/2021   Procedure: RIGHT HEART CATH;  Surgeon: Lorretta Harp, MD;  Location: [facility];  Service: Cardiovascular;  Laterality: N/A;   SHOULDER SURGERY Left    TONSILLECTOMY  age 9   VIDEO BRONCHOSCOPY Bilateral 05/31/2013   Procedure: VIDEO BRONCHOSCOPY WITHOUT FLUORO;  Surgeon: Brand Males, MD;  Location: [facility];  Service: Cardiopulmonary;  Laterality: Bilateral;   video bronscoscopy  05/19/1998   lung   Patient Active Problem List   Diagnosis Date Noted   Tachycardia 10/22/2021   Pulmonary HTN ([place])    Unspecified protein-calorie malnutrition ([place]) 04/05/2021   Pressure injury of skin 03/31/2021   UTI (urinary tract infection) 03/31/2021   Sepsis due to urinary tract  infection ([place]) 03/30/2021   Acute encephalopathy 03/30/2021   Thrombocytopenia ([place]) 03/30/2021   Diabetic neuropathy ([place]) 05/24/2019   Hypokalemia 04/21/2019   Anticoagulated 06/25/2018   Chronic pain 06/25/2018   CRI (chronic renal insufficiency), stage 3 (moderate) (HCC) 06/25/2018   Drug-induced constipation 06/18/2018   DVT (deep venous thrombosis) ([place]) 06/03/2018   Primary osteoarthritis of both knees 05/26/2018   Body mass index (BMI) of 50-59.9 in adult ([place]) 05/26/2018   Acquired hypothyroidism 05/26/2018   Chronic bilateral low back pain without sciatica 02/17/2018   Astigmatism with presbyopia, bilateral 03/26/2017   Cortical age-related cataract of both eyes 03/26/2017   Family history of glaucoma 03/26/2017   Long term current use of oral hypoglycemic drug 03/26/2017   Nuclear sclerotic cataract of both eyes 03/26/2017   Pain in left hip 02/18/2017   Closed nondisplaced intertrochanteric fracture of left femur with delayed healing 01/14/2017   Gout 11/29/2016   Depression 11/29/2016   Closed left hip fracture ([place]) 11/29/2016   GERD (gastroesophageal reflux disease) 11/29/2016   Closed intertrochanteric fracture of hip, left, initial encounter ([place]) 11/29/2016   Physical deconditioning 07/31/2015   Pulmonary fibrosis, postinflammatory ([place]) 07/31/2015   Bronchiectasis without complication ([place]) 41/63/8453   Tendinitis of left rotator cuff 06/13/2015   Chronic diastolic CHF (congestive heart failure) ([place]) 04/21/2015   Acute kidney injury superimposed on chronic kidney disease ([place]) 04/17/2015   Chronic respiratory failure with hypoxia ([place]) 02/09/2015   Dyspnea and respiratory abnormality 01/03/2015   Normal coronary arteries 11/02/2014   Morbid obesity ([place]) 10/30/2014   Dysphagia 08/21/2014   Chronic fatigue 06/21/2014   DOE (dyspnea on exertion) 05/24/2014   Primary osteoarthritis of left knee 04/26/2014   High risk medication use 04/17/2014   Aphasia  02/12/2014   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 ([place]) 01/18/2014   Lumbar facet arthropathy 01/18/2014   Bilateral edema of lower extremity 05/30/2013   Chronic cough 05/24/2013   ILD (interstitial lung disease) ([place]) 04/10/2013   Spinal stenosis of lumbar region with radiculopathy 07/12/2012   Inability to walk 07/12/2012   Meningioma of left sphenoid wing involving cavernous sinus ([place]) 02/17/2012   Chest pain of uncertain etiology 64/68/0321   Rheumatoid arthritis ([place]) 01/28/2012   Primary osteoarthritis of right knee 01/28/2012   Dyslipidemia    Thyroid disease    Essential hypertension     REFERRING PROVIDER: [name], MD   REFERRING DIAG: M54.50,G89.29 (ICD-10-CM) - Chronic bilateral low back pain without sciatica M25.552 (ICD-10-CM) - Pain in left hip  ONSET DATE:  Dec 2022  THERAPY DIAG:  Other low back pain  Pain  in left hip  Abnormal posture  Muscle weakness (generalized)  Difficulty in walking, not elsewhere classified  PERTINENT HISTORY: HTN, hypothyroidism, history of RA, COPD, DM, high cholesterol, CHF.  ORIF Lt femur from hip fracture "about 4-5 years ago"  PRECAUTIONS: May require supplemental oxygen  SUBJECTIVE: Pt indicated 4/10 pain today in back.  Pt indicated she has had continued trouble c lying down.  Worsened symptoms noted at night while trying to sleep (lying down trouble).  Pt indicated she has been trying to do more moving for exercise.   PAIN:  Are you having pain? Yes:  NPRS scale: 4/10 Pain location: Back Pain description: achy tightness Aggravating factors: standing/walking prolonged, lying down Relieving factors: Medicine, cream   OBJECTIVE:    PATIENT SURVEYS:  11/05/2021: update 32 10/08/2021: update 37 % 09/12/2021: update 43% 08/16/2021 FOTO intake: 33 predicted:  37   SCREENING FOR RED FLAGS: 08/16/2021 Bowel or bladder incontinence: No Cauda equina syndrome: No     COGNITION:            08/16/2021  Overall cognitive status: Within functional limits for tasks assessed                                 SENSATION: 08/16/2021 Light touch: WFL   MUSCLE LENGTH: 08/16/2021:  none tested today   POSTURE:  08/16/2021 : rounded shoulders, increased thoracic kyphosis, reduced lumbar lordosis in standing, forward trunk lean noted   PALPATION: 08/16/2021 : Tenderness across lower back bilateral, Lt > Rt, Lateral Lt hip tenderness to touch- trigger points noted   LUMBAR ROM:    Active  AROM  08/16/2021 AROM 10/08/2021 AROM 11/05/2021  Flexion To mid thighs c pain    Extension To neutral, ERP lumbar noted.  X5 continued pain complaints at end range < 25 % but more than neutral c ERP noted Can get to neutral with knees straight  Right lateral flexion      Left lateral flexion      Right rotation      Left rotation       (Blank rows = not tested)   LE ROM:    PROM Right 09/02/2021 Left 09/02/2021 Right 10/16/2021 Left 10/16/2021  Hip flexion  95     Hip extension        Hip abduction        Hip adduction        Hip internal rotation  25 in 90 deg flexion 35 in 90 deg flexion  23 in 90 deg flexion seated 45 in 90 deg flexion seated  Hip external rotation  55 in 90 deg flexion 45 in 90 deg flexion  57 in 90 deg flexion seated  45 in 90 deg flexion seated (pain in Lt knee reported)  Knee flexion        Knee extension        Ankle dorsiflexion        Ankle plantarflexion        Ankle inversion        Ankle eversion         (Blank rows = not tested)   LE MMT:   MMT Right 08/16/2021 Left 08/16/2021 Right 10/08/2021 Left 10/08/2021  Hip flexion 4/5 3+/5 5/5 4/5  Hip extension      Hip abduction      Hip adduction        Hip internal rotation  Hip external rotation        Knee flexion 5/5 5/5    Knee extension 5/5 4/5    Ankle dorsiflexion 5/5 5/5    Ankle plantarflexion        Ankle inversion        Ankle eversion         (Blank rows = not tested)   LUMBAR SPECIAL TESTS:   08/16/2021 (-) slump test bilateral   FUNCTIONAL TESTS:  11/11/2021:  TUG c rollator 16.87  10/08/2021:  TUG : c rollator 17.02 seconds   18 inch chair transfer s UE on 1st try  08/16/2021 TUG: c rollator 27.83 seconds   GAIT: 10/08/2021: 185 ft c rollator  09/10/2021:  Rollator 96 ft (stopped due to out of breath)  08/16/2021 Distance walked: Rollator household distances < 150 ft, modified independent                         Has wheelchair for community.        TODAY'S TREATMENT  11/11/2021 Therex:           Nustep UE/LE Lvl 5 15 mins  (monitoring O2 stats throughout c range from 92-95 % - cues given for deep breathing during, use of supplemental oxygen today in clinic).    Seated combo clam shell, bilateral UE ER 2 x 10 green band  Seated scapular retraction 5 second hold x 10  Standing green band rows c scapular retraction 2 x 10  Seated hip flexion c ipsilateral arm flexion x 10 bilateral  Seated reciprocal arm/leg flexion x 10 bilateral, ipsilateral x 10 bilateral   TherActivity  Sit to stand to sit from 18 inch chair c slow sitting focus 2 x 15 no UE assist   TUG x 2 c rollator and SBA for functional movement training   11/05/2021 Trunk extension AROM/Shoulder blade pinch/Sit to stand combination 5X  Opposite arm and leg in sitting 10X 5 seconds B 2 sets  Pelvic rotation 10X 10 seconds (uncomfortable supine on the firm treatment table)  Bridging (attempted)  Functional Activities: gait mechanics and endurance work with the walker 3X 30-50 feet with rest breaks PRN Progress Note   10/21/2021 Therex:           Nustep UE/LE Lvl 5 10 mins  (monitoring O2 stats throughout c range from 88 to 96 % - cues given for deep breathing during).   Seated scapular retraction 5 second hold x 10  Standing green band rows c scapular retraction 2 x 10  Seated hip flexion c ipsilateral arm flexion x 10 bilateral  Supine bridge 3 sec hold x 15  Supine scapular retraction c GH extension  in table 5 sec hold x 10  Seated cervical extension isometric 5 sec hold x 10     Rollator ambulation 100 ft c hands on handles instead of forearms.  Rest due to out of breath.  O2 sat was 92%.  Sitting and use of supplemental O2 raised to 97 with 2 mins rest.       PATIENT EDUCATION:  10/16/2021 Education details: HEP update Person educated: Patient Education method: Consulting civil engineer, Demonstration, Verbal cues, and Handouts Education comprehension: verbalized understanding and returned demonstration     HOME EXERCISE PROGRAM: Access Code: [email or web address] URL: [email or web address] Date: 10/16/2021 Prepared by: Scot Jun  Exercises - Supine Lower Trunk Rotation  - 2-3 x daily - 7 x weekly - 1 sets - 2 reps - 15  hold - Hooklying Single Knee to Chest Stretch  - 2-3 x daily - 7 x weekly - 1 sets - 2 reps - 15 hold - Seated Scapular Retraction  - 2-3 x daily - 7 x weekly - 1 sets - 5-10 reps - 3-5 hold - Seated Hip Abduction with Resistance  - 1-2 x daily - 7 x weekly - 1-2 sets - 10-15 reps - Seated March with Same Side Arm Raise   - 1-2 x daily - 7 x weekly - 1-2 sets - 10 reps   ASSESSMENT:   CLINICAL IMPRESSION: Continued adaptation of intervention to primarily sitting c some standing movements in clinic secondary to pain symptoms.  Pt to benefit from continued efforts to improve muscle strength/endurance and general mobility to facilitate improved functional level in daily progressive mobility c reduced pain symptoms impact.     OBJECTIVE IMPAIRMENTS Abnormal gait, decreased activity tolerance, decreased balance, decreased coordination, decreased endurance, decreased mobility, difficulty walking, decreased ROM, decreased strength, hypomobility, impaired perceived functional ability, increased muscle spasms, impaired flexibility, improper body mechanics, postural dysfunction, obesity, and pain.    ACTIVITY LIMITATIONS cleaning, community activity, driving, meal prep, and  laundry.    PERSONAL FACTORS 3+ comorbidities: HTN, hypothyroidism, history of RA, COPD, DM, high cholesterol, CHF  are also affecting patient's functional outcome.      REHAB POTENTIAL: Fair to good   CLINICAL DECISION MAKING: Evolving/moderate complexity   EVALUATION COMPLEXITY: Moderate     GOALS: Goals reviewed with patient? Yes   Short term PT Goals (target date for Short term goals are 3 weeks 09/06/2021) Patient will demonstrate independent use of home exercise program to maintain progress from in clinic treatments. Goal status: MET - assessed 09/10/2021   Long term PT goals (target dates for all long term goals are 10 weeks  11/22/2021 )   1. Patient will demonstrate/report pain at worst less than or equal to 2/10 to facilitate minimal limitation in daily activity secondary to pain symptoms. Goal status: On Going - assessed 11/05/2021  2. Patient will demonstrate independent use of home exercise program to facilitate ability to maintain/progress functional gains from skilled physical therapy services. Goal status: Met- assessed 11/05/2021   3. Patient will demonstrate FOTO outcome > or = 37 % to indicate reduced disability due to condition. Goal status: On Going- assessed 11/05/2021   4.  Patient will demonstrate TUG c rollator < 14 seconds to indicate reduced fall risk and improved community integration level.  Goal status: On Going  assessed 11/05/2021   5.  Patient will demonstrate lumbar ext AROM > or = 50% to facilitate upright standing/walking posture for household and community intergration.    Goal status: On Going - assessed 11/05/2021   6.  Patient will demonstrate bilateral knee MMT 5/5 throughout, Lt hip MMT 4/5 or greater throughout to facilitate daily movement at [facility].   Goal status: On Going - assessed 11/05/2021   7.  Patient will demonstrate ability to walk > 500 ft s resting to facilitate ability to ambulation in community.  a.  Goal Status: On Going - assessed  11/05/2021     PLAN: PT FREQUENCY: 2x/week   PT DURATION: 4 weeks   PLANNED INTERVENTIONS: Therapeutic exercises, Therapeutic activity, Neuro Muscular re-education, Balance training, Gait training, Patient/Family education, Joint mobilization, Stair training, DME instructions, Dry Needling, Electrical stimulation, Cryotherapy, Moist heat, Taping, Ultrasound, Ionotophoresis 10m/ml Dexamethasone, and Manual therapy.  All included unless contraindicated.   PLAN FOR NEXT SESSION: Continue work on  sitting posture, frequent changes of position, standing endurance, walking distance, LE and core strength, goal analysis and revisions PRN.   Scot Jun, PT, DPT, OCS, ATC 11/21/21  2:53 PM

## 2021-11-13 ENCOUNTER — Other Ambulatory Visit: Payer: Medicare Other | Admitting: Nurse Practitioner

## 2021-11-14 ENCOUNTER — Encounter: Payer: Medicare Other | Admitting: Rehabilitative and Restorative Service Providers"

## 2021-11-15 ENCOUNTER — Ambulatory Visit (INDEPENDENT_AMBULATORY_CARE_PROVIDER_SITE_OTHER): Payer: Medicare Other | Admitting: Cardiovascular Disease

## 2021-11-15 ENCOUNTER — Encounter: Payer: Self-pay | Admitting: Cardiovascular Disease

## 2021-11-15 DIAGNOSIS — I10 Essential (primary) hypertension: Secondary | ICD-10-CM

## 2021-11-15 DIAGNOSIS — Z86711 Personal history of pulmonary embolism: Secondary | ICD-10-CM | POA: Insufficient documentation

## 2021-11-15 DIAGNOSIS — E785 Hyperlipidemia, unspecified: Secondary | ICD-10-CM | POA: Diagnosis not present

## 2021-11-15 DIAGNOSIS — I2699 Other pulmonary embolism without acute cor pulmonale: Secondary | ICD-10-CM | POA: Diagnosis not present

## 2021-11-15 DIAGNOSIS — I272 Pulmonary hypertension, unspecified: Secondary | ICD-10-CM | POA: Diagnosis not present

## 2021-11-15 MED ORDER — APIXABAN 5 MG PO TABS: 5.0000 mg | ORAL_TABLET | Freq: Two times a day (BID) | ORAL | 6 refills | Status: DC

## 2021-11-15 NOTE — Assessment & Plan Note (Signed)
Ms. Wisenbaker was seen by Doreene Adas PA-C in the office earlier this month who ordered a chest CTA 10/30/2021 that revealed acute segmental and subsegmental pulmonary embolus in the right upper lobe with subsegmental emboli in the right lower lobe.  There were no central pulmonary emboli noted.  She did have venous Dopplers done last month that were normal.  She was immediately put on Eliquis although has not noticed significant improvement in her breathing.

## 2021-11-15 NOTE — Progress Notes (Signed)
11/15/2021 [name]   01-11-1941  [id number]  Primary Physician Martyn Ehrich, NP Primary Cardiologist: Lorretta Harp MD [name], [place]  HPI:  [name] is a 81 y.o.    moderately overweight married African American female mother of 6 children, grandmother of a grandchildren referred by [name] for cardiovascular evaluation because of progressive dyspnea on exertion and chest pain. I last saw her in the  office 08/02/2021.[name]  She is accompanied by her daughter [name] today..  She did see Kerin Ransom, PA-C in the office 11/16/2019.[name]  Her cardiovascular risk factor are  notable for a strong family history of heart disease with her mother and 7 brothers old with an ischemic heart disease. She has never had a heart attack or stroke. She does have interstitial lung disease as well as rheumatoid arthritis. She is on home oxygen. She also has a brain tumor/meningioma 2 with gamma knife therapy  She's had progressive disabling dyspnea as well as daily chest pain. She underwent cardiac catheterization 10/31/14 by Dr. Irish Lack revealing normal coronary arteries and normal LV function. She did have mild pulmonary hypertension. 2-D echo showed normal LV function with grade 1 diastolic dysfunction and mild pulmonary hypertension. Her symptoms really have not changed on by mouth diuretics. She was referred to [facility] for further evaluation and treatment of interstitial lung disease. Since beginning an antibiotic and an inhaled bronchodilator her shortness of breath is somewhat improved.   She had an episode of syncope prior to seeing Kerin Ransom thought to be related to overmedication and dehydration from poor p.o. intake.  Her meds were adjusted.  She was also hospitalized last month with sepsis related to a tooth infection with and was in the hospital for 10 days.  She had fluid overload and has since diuresed.  She gets occasional atypical chest pain.  Her  blood pressure is mildly elevated when she takes it at home.  She is on furosemide 60 mg a day.   She had been seeing one of our our Pharm.D. for medication titration for hypertension which is under better control.    She had noticed increasing weight gain lower extremity edema as well as some atypical chest pain and left flank pain.  She did see her nephrologist who put her on antibiotic for presumed UTI and increased her diuretic from furosemide 60 to 80 mg a day.  Her symptoms sound like she has an "upper urinary tract infection" which may have exacerbated her diastolic heart failure.  Her nephrologist is following her volume status and renal function closely.   Since I saw her 3 months ago I did do a right heart cath at the request of [name] on 08/12/2021 which revealed a pulmonary artery pressure of 43/19 with a mean of 30, right atrial pressure of 20/18 and a wedge pressure of 22 mean.  Her cardiac output was 5 L/min.  Her most recent 2D echo performed 11/06/2021 revealed normal LV systolic function with out significant pulmonary hypertension.  She did see [name] in the office on 10/28/2021 complaining of increasing shortness of breath.  As result a chest CTA was performed on 10/30/2021 revealing multiple subsegmental pulmonary emboli in the right upper and middle lobe.  She has subsequently been put on Eliquis.  Venous Dopplers done a month before showed no evidence of DVT.     Current Meds  Medication Sig   [DISCONTINUED] apixaban (ELIQUIS) 5 MG TABS  tablet Take 1 tablet (5 mg total) by mouth 2 (two) times daily.     Allergies  Allergen Reactions   Codeine Swelling and Other (See Comments)    Facial swelling Chest pain and swelling in legs     Infliximab Anaphylaxis    (REMICADE) "sent me into shock"    Lisinopril Swelling    Face and neck swelling     Social History   Socioeconomic History   Marital status: Married    Spouse name: Not on file   Number of children: 6    Years of education: college   Highest education level: Not on file  Occupational History   Occupation: retired Marine scientist  Tobacco Use   Smoking status: Never   Smokeless tobacco: Never  Vaping Use   Vaping Use: Never used  Substance and Sexual Activity   Alcohol use: No   Drug use: No   Sexual activity: Not Currently  Other Topics Concern   Not on file  Social History Narrative   Patient consumes 2-3 cups coffee per day.   Social Determinants of Health   Financial Resource Strain: Low Risk  (10/23/2021)   Overall Financial Resource Strain (CARDIA)    Difficulty of Paying Living Expenses: Not hard at all  Food Insecurity: No Food Insecurity (10/23/2021)   Hunger Vital Sign    Worried About Running Out of Food in the Last Year: Never true    Ran Out of Food in the Last Year: Never true  Transportation Needs: No Transportation Needs (10/23/2021)   PRAPARE - Hydrologist (Medical): No    Lack of Transportation (Non-Medical): No  Physical Activity: Insufficiently Active (10/23/2021)   Exercise Vital Sign    Days of Exercise per Week: 2 days    Minutes of Exercise per Session: 40 min  Stress: No Stress Concern Present (10/18/2020)   [place]    Feeling of Stress : Not at all  Social Connections: [place] (10/23/2021)   Social Connection and Isolation Panel [NHANES]    Frequency of Communication with Friends and Family: More than three times a week    Frequency of Social Gatherings with Friends and Family: More than three times a week    Attends Religious Services: More than 4 times per year    Active Member of [employer] or Organizations: Yes    Attends Music therapist: More than 4 times per year    Marital Status: Married  Human resources officer Violence: Not At Risk (10/23/2021)   Humiliation, Afraid, Rape, and Kick questionnaire    Fear of Current or Ex-Partner: No     Emotionally Abused: No    Physically Abused: No    Sexually Abused: No     Review of Systems: General: negative for chills, fever, night sweats or weight changes.  Cardiovascular: negative for chest pain, dyspnea on exertion, edema, orthopnea, palpitations, paroxysmal nocturnal dyspnea or shortness of breath Dermatological: negative for rash Respiratory: negative for cough or wheezing Urologic: negative for hematuria Abdominal: negative for nausea, vomiting, diarrhea, bright red blood per rectum, melena, or hematemesis Neurologic: negative for visual changes, syncope, or dizziness All other systems reviewed and are otherwise negative except as noted above.    Blood pressure 122/80, pulse 98, height _0  (1.626 m), weight 219 lb (99.3 kg).  General appearance: alert and no distress Neck: no adenopathy, no carotid bruit, no JVD, supple, symmetrical, trachea midline, and thyroid  not enlarged, symmetric, no tenderness/mass/nodules Lungs: clear to auscultation bilaterally Heart: regular rate and rhythm, S1, S2 normal, no murmur, click, rub or gallop Extremities: extremities normal, atraumatic, no cyanosis or edema Pulses: 2+ and symmetric Skin: Skin color, texture, turgor normal. No rashes or lesions Neurologic: Grossly normal  EKG not performed today  ASSESSMENT AND PLAN:   Dyslipidemia History of dyslipidemia on statin therapy with lipid profile performed 09/26/2020 revealing total cholesterol of 136, LDL 75 and HDL 40.  Essential hypertension History of essential hypertension blood pressure measured today at 122/80.  She is on metoprolol and olmesartan.  Pulmonary HTN ([place]) History of pulm hypertension in the setting of interstitial lung disease with right heart cath performed by myself 08/12/2021 revealing a pulmonary artery pressure of 43/19 with a mean of 30.  The wedge pressure mean was 22.  Her most recent 2D echo performed 11/06/2021 revealed normal LV and RV function without  significant pulmonary hypertension.  Pulmonary embolism [facility]) Ms. Smyre was seen by Doreene Adas PA-C in the office earlier this month who ordered a chest CTA 10/30/2021 that revealed acute segmental and subsegmental pulmonary embolus in the right upper lobe with subsegmental emboli in the right lower lobe.  There were no central pulmonary emboli noted.  She did have venous Dopplers done last month that were normal.  She was immediately put on Eliquis although has not noticed significant improvement in her breathing.     Lorretta Harp MD FACP,FACC,FAHA, [facility] 11/15/2021 3:55 PM

## 2021-11-15 NOTE — Assessment & Plan Note (Signed)
History of dyslipidemia on statin therapy with lipid profile performed 09/26/2020 revealing total cholesterol of 136, LDL 75 and HDL 40.

## 2021-11-15 NOTE — Patient Instructions (Signed)
Medication Instructions:  Your physician recommends that you continue on your current medications as directed. Please refer to the Current Medication list given to you today.  *If you need a refill on your cardiac medications before your next appointment, please call your pharmacy*    Follow-Up: At [facility], you and your health needs are our priority.  As part of our continuing mission to provide you with exceptional heart care, we have created designated Provider Care Teams.  These Care Teams include your primary Cardiologist (physician) and Advanced Practice Providers (APPs -  Physician Assistants and Nurse Practitioners) who all work together to provide you with the care you need, when you need it.  We recommend signing up for the patient portal called "MyChart".  Sign up information is provided on this After Visit Summary.  MyChart is used to connect with patients for Virtual Visits (Telemedicine).  Patients are able to view lab/test results, encounter notes, upcoming appointments, etc.  Non-urgent messages can be sent to your provider as well.   To learn more about what you can do with MyChart, go to [email or web address].    Your next appointment:   3 month(s)  The format for your next appointment:   In Person  Provider:   Fabian Sharp, PA-C       Then, Quay Burow, MD will plan to see you again in 6 month(s).

## 2021-11-15 NOTE — Assessment & Plan Note (Signed)
History of essential hypertension blood pressure measured today at 122/80.  She is on metoprolol and olmesartan.

## 2021-11-15 NOTE — Assessment & Plan Note (Signed)
History of pulm hypertension in the setting of interstitial lung disease with right heart cath performed by myself 08/12/2021 revealing a pulmonary artery pressure of 43/19 with a mean of 30.  The wedge pressure mean was 22.  Her most recent 2D echo performed 11/06/2021 revealed normal LV and RV function without significant pulmonary hypertension.

## 2021-11-21 ENCOUNTER — Ambulatory Visit (INDEPENDENT_AMBULATORY_CARE_PROVIDER_SITE_OTHER): Payer: Medicare Other | Admitting: Rehabilitative and Restorative Service Providers"

## 2021-11-21 ENCOUNTER — Encounter: Payer: Self-pay | Admitting: Rehabilitative and Restorative Service Providers"

## 2021-11-21 DIAGNOSIS — M25552 Pain in left hip: Secondary | ICD-10-CM

## 2021-11-21 DIAGNOSIS — M5459 Other low back pain: Secondary | ICD-10-CM

## 2021-11-21 DIAGNOSIS — R262 Difficulty in walking, not elsewhere classified: Secondary | ICD-10-CM

## 2021-11-21 DIAGNOSIS — R293 Abnormal posture: Secondary | ICD-10-CM

## 2021-11-21 DIAGNOSIS — M6281 Muscle weakness (generalized): Secondary | ICD-10-CM | POA: Diagnosis not present

## 2021-11-21 NOTE — Therapy (Signed)
OUTPATIENT PHYSICAL THERAPY TREATMENT NOTE   Patient Name: [name] MRN: [id number] DOB:1940/08/03, 81 y.o., female Today's Date: 11/21/2021  PCP: Martyn Ehrich, NP REFERRING PROVIDER: [name], MD    END OF SESSION:   PT End of Session - 11/21/21 1502     Visit Number 12    Number of Visits 20    Date for PT Re-Evaluation 01/06/22    Authorization Type Medicare and [facility] - KX at 15    Progress Note Due on Visit 20    PT Start Time 1430    PT Stop Time 1510    PT Time Calculation (min) 40 min    Activity Tolerance Patient limited by fatigue;Patient limited by pain    Behavior During Therapy WFL for tasks assessed/performed                Past Medical History:  Diagnosis Date   Acute on chronic diastolic (congestive) heart failure (HCC)    Anemia    iron deficiency anemia - secondary to blood loss ( chronic)    Anxiety    Arthritis    endstage changes bilateral knees/bilateral ankles.    Asthma    Carotid artery occlusion    Chronic fatigue    Chronic kidney disease    Closed left hip fracture (HCC)    Clotting disorder ([place])    pt denies this   Contusion of left knee    due to fall 1/14.   COPD (chronic obstructive pulmonary disease) (HCC)    pulmonary fibrosis   Depression, reactive    Diabetes mellitus    type II    Diastolic dysfunction    Difficulty in walking    Family history of heart disease    Generalized muscle weakness    Gout    High cholesterol    History of falling    Hypertension    Hypothyroidism    Interstitial lung disease ([place])    Meningioma of left sphenoid wing involving cavernous sinus ([place]) 02/17/2012   Continue diplopia, left eye pain and left headaches.     Morbid obesity ([place])    Neuromuscular disorder ([place])    diabetic neuropathy    Normal coronary arteries    cardiac catheterization performed  10/31/14   Pneumonia    RA (rheumatoid arthritis) ([place])    has been off methotreaxte since 10/13.    Spinal stenosis of lumbar region    Thyroid disease    Unspecified lack of coordination    URI (upper respiratory infection)    Past Surgical History:  Procedure Laterality Date   ABDOMINAL HYSTERECTOMY     BRAIN SURGERY     Gamma knife 10/13. Needs repeat spring  '14   CARDIAC CATHETERIZATION N/A 10/31/2014   Procedure: Right/Left Heart Cath and Coronary Angiography;  Surgeon: [name], MD;  Location: [facility];  Service: Cardiovascular;  Laterality: N/A;   CYST EXCISION     2 on face   CYST REMOVAL NECK     ESOPHAGOGASTRODUODENOSCOPY (EGD) WITH PROPOFOL N/A 09/14/2014   Procedure: ESOPHAGOGASTRODUODENOSCOPY (EGD) WITH PROPOFOL;  Surgeon: [name], MD;  Location: [facility];  Service: Endoscopy;  Laterality: N/A;   INCISION AND DRAINAGE HIP Left 01/16/2017   Procedure: IRRIGATION AND DEBRIDEMENT LEFT HIP;  Surgeon: [name], MD;  Location: [facility];  Service: Orthopedics;  Laterality: Left;   INTRAMEDULLARY (IM) NAIL INTERTROCHANTERIC Left 11/29/2016   Procedure: INTRAMEDULLARY (IM) NAIL INTERTROCHANTRIC;  Surgeon: [name], MD;  Location: [facility];  Service: Orthopedics;  Laterality: Left;   OVARY SURGERY     RIGHT HEART CATH N/A 08/12/2021   Procedure: RIGHT HEART CATH;  Surgeon: Lorretta Harp, MD;  Location: [facility];  Service: Cardiovascular;  Laterality: N/A;   SHOULDER SURGERY Left    TONSILLECTOMY  age 34   VIDEO BRONCHOSCOPY Bilateral 05/31/2013   Procedure: VIDEO BRONCHOSCOPY WITHOUT FLUORO;  Surgeon: Brand Males, MD;  Location: [facility];  Service: Cardiopulmonary;  Laterality: Bilateral;   video bronscoscopy  05/19/1998   lung   Patient Active Problem List   Diagnosis Date Noted   Pulmonary embolism ([place]) 11/15/2021   Tachycardia 10/22/2021   Pulmonary HTN ([place])    Unspecified protein-calorie malnutrition ([place]) 04/05/2021   Pressure injury of skin 03/31/2021   UTI (urinary tract infection)  03/31/2021   Sepsis due to urinary tract infection ([place]) 03/30/2021   Acute encephalopathy 03/30/2021   Thrombocytopenia ([place]) 03/30/2021   Diabetic neuropathy ([place]) 05/24/2019   Hypokalemia 04/21/2019   Anticoagulated 06/25/2018   Chronic pain 06/25/2018   CRI (chronic renal insufficiency), stage 3 (moderate) (HCC) 06/25/2018   Drug-induced constipation 06/18/2018   DVT (deep venous thrombosis) ([place]) 06/03/2018   Primary osteoarthritis of both knees 05/26/2018   Body mass index (BMI) of 50-59.9 in adult ([place]) 05/26/2018   Acquired hypothyroidism 05/26/2018   Chronic bilateral low back pain without sciatica 02/17/2018   Astigmatism with presbyopia, bilateral 03/26/2017   Cortical age-related cataract of both eyes 03/26/2017   Family history of glaucoma 03/26/2017   Long term current use of oral hypoglycemic drug 03/26/2017   Nuclear sclerotic cataract of both eyes 03/26/2017   Pain in left hip 02/18/2017   Closed nondisplaced intertrochanteric fracture of left femur with delayed healing 01/14/2017   Gout 11/29/2016   Depression 11/29/2016   Closed left hip fracture ([place]) 11/29/2016   GERD (gastroesophageal reflux disease) 11/29/2016   Closed intertrochanteric fracture of hip, left, initial encounter ([place]) 11/29/2016   Physical deconditioning 07/31/2015   Pulmonary fibrosis, postinflammatory ([place]) 07/31/2015   Bronchiectasis without complication ([place]) 99/24/2683   Tendinitis of left rotator cuff 06/13/2015   Chronic diastolic CHF (congestive heart failure) ([place]) 04/21/2015   Acute kidney injury superimposed on chronic kidney disease ([place]) 04/17/2015   Chronic respiratory failure with hypoxia ([place]) 02/09/2015   Dyspnea and respiratory abnormality 01/03/2015   Normal coronary arteries 11/02/2014   Morbid obesity ([place]) 10/30/2014   Dysphagia 08/21/2014   Chronic fatigue 06/21/2014   DOE (dyspnea on exertion) 05/24/2014   Primary osteoarthritis of left knee 04/26/2014   High risk  medication use 04/17/2014   Aphasia 02/12/2014   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 ([place]) 01/18/2014   Lumbar facet arthropathy 01/18/2014   Bilateral edema of lower extremity 05/30/2013   Chronic cough 05/24/2013   ILD (interstitial lung disease) ([place]) 04/10/2013   Spinal stenosis of lumbar region with radiculopathy 07/12/2012   Inability to walk 07/12/2012   Meningioma of left sphenoid wing involving cavernous sinus ([place]) 02/17/2012   Chest pain of uncertain etiology 41/96/2229   Rheumatoid arthritis ([place]) 01/28/2012   Primary osteoarthritis of right knee 01/28/2012   Dyslipidemia    Thyroid disease    Essential hypertension     REFERRING PROVIDER: [name], MD   REFERRING DIAG: M54.50,G89.29 (ICD-10-CM) - Chronic bilateral low back pain without sciatica M25.552 (ICD-10-CM) - Pain in left hip  ONSET DATE:  Dec 2022  THERAPY DIAG:  Other low back pain  Pain in left hip  Abnormal posture  Muscle weakness (generalized)  Difficulty in walking, not elsewhere classified  PERTINENT HISTORY: HTN, hypothyroidism, history of RA, COPD, DM, high cholesterol, CHF.  ORIF Lt femur from hip fracture "about 4-5 years ago"  PRECAUTIONS: May require supplemental oxygen  SUBJECTIVE:    Pt indicated her back was "killing her today"  pain patch helped.  Rated back pain 5/10 today upon arrival.    PAIN:  Are you having pain? Yes:  NPRS scale: 5/10 Pain location: Back Pain description: achy tightness Aggravating factors: standing/walking prolonged, lying down on back Relieving factors: Medicine, patch   OBJECTIVE:    PATIENT SURVEYS:  11/05/2021: update 32 10/08/2021: update 37 % 09/12/2021: update 43% 08/16/2021 FOTO intake: 33 predicted:  37   SCREENING FOR RED FLAGS: 08/16/2021 Bowel or bladder incontinence: No Cauda equina syndrome: No     COGNITION:            08/16/2021 Overall cognitive status: Within functional limits for tasks assessed                                  SENSATION: 08/16/2021 Light touch: WFL   MUSCLE LENGTH: 08/16/2021:  none tested today   POSTURE:  08/16/2021 : rounded shoulders, increased thoracic kyphosis, reduced lumbar lordosis in standing, forward trunk lean noted   PALPATION: 08/16/2021 : Tenderness across lower back bilateral, Lt > Rt, Lateral Lt hip tenderness to touch- trigger points noted   LUMBAR ROM:    Active  AROM  08/16/2021 AROM 10/08/2021 AROM 11/05/2021  Flexion To mid thighs c pain    Extension To neutral, ERP lumbar noted.  X5 continued pain complaints at end range < 25 % but more than neutral c ERP noted Can get to neutral with knees straight  Right lateral flexion      Left lateral flexion      Right rotation      Left rotation       (Blank rows = not tested)   LE ROM:    PROM Right 09/02/2021 Left 09/02/2021 Right 10/16/2021 Left 10/16/2021  Hip flexion  95     Hip extension        Hip abduction        Hip adduction        Hip internal rotation  25 in 90 deg flexion 35 in 90 deg flexion  23 in 90 deg flexion seated 45 in 90 deg flexion seated  Hip external rotation  55 in 90 deg flexion 45 in 90 deg flexion  57 in 90 deg flexion seated  45 in 90 deg flexion seated (pain in Lt knee reported)  Knee flexion        Knee extension        Ankle dorsiflexion        Ankle plantarflexion        Ankle inversion        Ankle eversion         (Blank rows = not tested)   LE MMT:   MMT Right 08/16/2021 Left 08/16/2021 Right 10/08/2021 Left 10/08/2021 Left 11/21/2021  Hip flexion 4/5 3+/5 5/5 4/5 5/5  Hip extension       Hip abduction       Hip adduction         Hip internal rotation         Hip  external rotation         Knee flexion 5/5 5/5     Knee extension 5/5 4/5     Ankle dorsiflexion 5/5 5/5     Ankle plantarflexion         Ankle inversion         Ankle eversion          (Blank rows = not tested)   LUMBAR SPECIAL TESTS:  08/16/2021 (-) slump test bilateral   FUNCTIONAL TESTS:   11/11/2021:  TUG c rollator 16.87  10/08/2021:  TUG : c rollator 17.02 seconds   18 inch chair transfer s UE on 1st try  08/16/2021 TUG: c rollator 27.83 seconds   GAIT: 11/21/2021: 82 ft c rollator - limited by Rt knee pain  10/08/2021: 185 ft c rollator  09/10/2021:  Rollator 96 ft (stopped due to out of breath)  08/16/2021 Distance walked: Rollator household distances < 150 ft, modified independent                         Has wheelchair for community.        TODAY'S TREATMENT  11/21/2021 Therex:           Nustep UE/LE Lvl 5 10 mins    Double leg press 50 lbs x 20  Seated pelvic tilt anterior/posterior x 15  Seated scapular retraction 5 second hold x 5  Seated LAQ c pause in end range x 15 bilateral  Seated combo clam shell, bilateral UE ER 2 x 10 green band  Seated green band PF c bicep curl bilateral x 10 bilateral   Sit to stand from 18 inch chair x 5     11/11/2021 Therex:           Nustep UE/LE Lvl 5 15 mins  (monitoring O2 stats throughout c range from 92-95 % - cues given for deep breathing during, use of supplemental oxygen today in clinic).    Seated combo clam shell, bilateral UE ER 2 x 10 green band  Seated scapular retraction 5 second hold x 10  Standing green band rows c scapular retraction 2 x 10  Seated hip flexion c ipsilateral arm flexion x 10 bilateral  Seated reciprocal arm/leg flexion x 10 bilateral, ipsilateral x 10 bilateral   TherActivity  Sit to stand to sit from 18 inch chair c slow sitting focus 2 x 15 no UE assist   TUG x 2 c rollator and SBA for functional movement training   11/05/2021 Trunk extension AROM/Shoulder blade pinch/Sit to stand combination 5X  Opposite arm and leg in sitting 10X 5 seconds B 2 sets  Pelvic rotation 10X 10 seconds (uncomfortable supine on the firm treatment table)  Bridging (attempted)  Functional Activities: gait mechanics and endurance work with the walker 3X 30-50 feet with rest breaks PRN Progress  Note   10/21/2021 Therex:           Nustep UE/LE Lvl 5 10 mins  (monitoring O2 stats throughout c range from 88 to 96 % - cues given for deep breathing during).   Seated scapular retraction 5 second hold x 10  Standing green band rows c scapular retraction 2 x 10  Seated hip flexion c ipsilateral arm flexion x 10 bilateral  Supine bridge 3 sec hold x 15  Supine scapular retraction c GH extension in table 5 sec hold x 10  Seated cervical extension isometric 5 sec hold x 10  Rollator ambulation 100 ft c hands on handles instead of forearms.  Rest due to out of breath.  O2 sat was 92%.  Sitting and use of supplemental O2 raised to 97 with 2 mins rest.       PATIENT EDUCATION:  10/16/2021 Education details: HEP update Person educated: Patient Education method: Consulting civil engineer, Demonstration, Verbal cues, and Handouts Education comprehension: verbalized understanding and returned demonstration     HOME EXERCISE PROGRAM: Access Code: [email or web address] URL: [email or web address] Date: 10/16/2021 Prepared by: Scot Jun  Exercises - Supine Lower Trunk Rotation  - 2-3 x daily - 7 x weekly - 1 sets - 2 reps - 15 hold - Hooklying Single Knee to Chest Stretch  - 2-3 x daily - 7 x weekly - 1 sets - 2 reps - 15 hold - Seated Scapular Retraction  - 2-3 x daily - 7 x weekly - 1 sets - 5-10 reps - 3-5 hold - Seated Hip Abduction with Resistance  - 1-2 x daily - 7 x weekly - 1-2 sets - 10-15 reps - Seated March with Same Side Arm Raise   - 1-2 x daily - 7 x weekly - 1-2 sets - 10 reps   ASSESSMENT:   CLINICAL IMPRESSION: Continued c plan of progressive postural strengthening and general mobility improvements for activity tolerance improvements.  Back pain more noted today so withheld WB standing activity accordingly.     OBJECTIVE IMPAIRMENTS Abnormal gait, decreased activity tolerance, decreased balance, decreased coordination, decreased endurance, decreased mobility, difficulty  walking, decreased ROM, decreased strength, hypomobility, impaired perceived functional ability, increased muscle spasms, impaired flexibility, improper body mechanics, postural dysfunction, obesity, and pain.    ACTIVITY LIMITATIONS cleaning, community activity, driving, meal prep, and laundry.    PERSONAL FACTORS 3+ comorbidities: HTN, hypothyroidism, history of RA, COPD, DM, high cholesterol, CHF  are also affecting patient's functional outcome.      REHAB POTENTIAL: Fair to good   CLINICAL DECISION MAKING: Evolving/moderate complexity   EVALUATION COMPLEXITY: Moderate     GOALS: Goals reviewed with patient? Yes   Short term PT Goals (target date for Short term goals are 3 weeks 09/06/2021) Patient will demonstrate independent use of home exercise program to maintain progress from in clinic treatments. Goal status: MET - assessed 09/10/2021   Long term PT goals (target dates for all long term goals are 10 weeks  11/22/2021 )   1. Patient will demonstrate/report pain at worst less than or equal to 2/10 to facilitate minimal limitation in daily activity secondary to pain symptoms. Goal status: On Going - assessed 11/05/2021  2. Patient will demonstrate independent use of home exercise program to facilitate ability to maintain/progress functional gains from skilled physical therapy services. Goal status: Met- assessed 11/05/2021   3. Patient will demonstrate FOTO outcome > or = 37 % to indicate reduced disability due to condition. Goal status: On Going- assessed 11/05/2021   4.  Patient will demonstrate TUG c rollator < 14 seconds to indicate reduced fall risk and improved community integration level.  Goal status: On Going  assessed 11/05/2021   5.  Patient will demonstrate lumbar ext AROM > or = 50% to facilitate upright standing/walking posture for household and community intergration.    Goal status: On Going - assessed 11/05/2021   6.  Patient will demonstrate bilateral knee MMT 5/5  throughout, Lt hip MMT 4/5 or greater throughout to facilitate daily movement at [facility].   Goal status: On Going - assessed 11/05/2021   7.  Patient  will demonstrate ability to walk > 500 ft s resting to facilitate ability to ambulation in community.  a.  Goal Status: On Going - assessed 11/05/2021     PLAN: PT FREQUENCY: 2x/week   PT DURATION: 4 weeks   PLANNED INTERVENTIONS: Therapeutic exercises, Therapeutic activity, Neuro Muscular re-education, Balance training, Gait training, Patient/Family education, Joint mobilization, Stair training, DME instructions, Dry Needling, Electrical stimulation, Cryotherapy, Moist heat, Taping, Ultrasound, Ionotophoresis 27m/ml Dexamethasone, and Manual therapy.  All included unless contraindicated.   PLAN FOR NEXT SESSION: Continue work on sitting posture, frequent changes of position, standing endurance, walking distance, LE and core strength   MScot Jun PT, DPT, OCS, ATC 11/21/21  3:17 PM

## 2021-11-26 ENCOUNTER — Encounter: Payer: Medicare Other | Admitting: Rehabilitative and Restorative Service Providers"

## 2021-11-27 ENCOUNTER — Other Ambulatory Visit: Payer: Self-pay

## 2021-11-27 DIAGNOSIS — I2699 Other pulmonary embolism without acute cor pulmonale: Secondary | ICD-10-CM

## 2021-11-27 MED ORDER — APIXABAN 5 MG PO TABS: 5.0000 mg | ORAL_TABLET | Freq: Two times a day (BID) | ORAL | 5 refills | Status: DC

## 2021-11-27 NOTE — Telephone Encounter (Signed)
Prescription refill request for Eliquis received. Indication: PE Last office visit:11/15/21 Gwenlyn Found)  Scr: 0.84 (10/29/21) Age: 81 Weight: 99.3kg  Appropriate dose and refill sent to requested pharmacy.

## 2021-11-28 ENCOUNTER — Encounter: Payer: Self-pay | Admitting: Physical Medicine and Rehabilitation

## 2021-11-28 ENCOUNTER — Ambulatory Visit (INDEPENDENT_AMBULATORY_CARE_PROVIDER_SITE_OTHER): Payer: Medicare Other | Admitting: Physical Medicine and Rehabilitation

## 2021-11-28 DIAGNOSIS — M48062 Spinal stenosis, lumbar region with neurogenic claudication: Secondary | ICD-10-CM | POA: Diagnosis not present

## 2021-11-28 DIAGNOSIS — M7062 Trochanteric bursitis, left hip: Secondary | ICD-10-CM

## 2021-11-28 DIAGNOSIS — M5416 Radiculopathy, lumbar region: Secondary | ICD-10-CM | POA: Diagnosis not present

## 2021-11-28 DIAGNOSIS — M47816 Spondylosis without myelopathy or radiculopathy, lumbar region: Secondary | ICD-10-CM

## 2021-11-28 DIAGNOSIS — R269 Unspecified abnormalities of gait and mobility: Secondary | ICD-10-CM | POA: Diagnosis not present

## 2021-11-28 NOTE — Progress Notes (Signed)
Pt has hx of inj of 10/22/21 pt state it helped with relief 70% and still helping a little. Pt state getting in and out of bed and laying down makes the pain worse. Pt state she takes pain meds to help ease her pain. Pt state she has cramps in her thigh when she laying down.  Numeric Pain Rating Scale and Functional Assessment Average Pain 7 Pain Right Now 3 My pain is constant, burning, aching, and cramps Pain is worse with: walking, some activites, and laying down Pain improves with: heat/ice, medication, and injections   In the last MONTH (on 0-10 scale) has pain interfered with the following?  1. General activity like being  able to carry out your everyday physical activities such as walking, climbing stairs, carrying groceries, or moving a chair?  Rating(5)  2. Relation with others like being able to carry out your usual social activities and roles such as  activities at home, at work and in your community. Rating(6)  3. Enjoyment of life such that you have  been bothered by emotional problems such as feeling anxious, depressed or irritable?  Rating(7)

## 2021-11-28 NOTE — Progress Notes (Signed)
Cricket SAMAA UEDA - 81 y.o. female MRN [id number]  Date of birth: [birth year]  Office Visit Note: Visit Date: 11/28/2021 PCP: Martyn Ehrich, NP Referred by: [name], MD  Subjective: Chief Complaint  Patient presents with   Lower Back - Pain   HPI: [name] is a 81 y.o. female who comes in today for evaluation of chronic bilateral lower back pain radiating to legs. Patient is accompanied by her daughter during our visit today. Patient reports pain has been ongoing for several years and is exacerbated by standing, walking and activity. She describes her pain as a sore, aching and tingling sensation, currently rates as 3 out of 10. She reports some relief of pain with rest and use of medications. Patient continues to take 15 mg Morphine PO every 12 hours that is prescribed by Danella Sensing, NP at [facility]. She is currently undergoing formal physical therapy with our in house team and reports significant relief of pain with these treatments. Patients lumbar MRI from 2021 exhibits multi-level facet hypertrophy, moderate spinal canal stenosis at L1-L2 and L2-L3, moderate to severe at L3-L4 and severe at L4-L5. There is also large extruded disc fragment within the left L2 lateral recess resulting in left sided nerve encroachment. Patient underwent bilateral L4 transforaminal epidural steroid injection in our office on 10/22/2021 and reports greater than 70% relief of pain. Patient also reports increased functionality and states her mobility has greatly improved. Patient states she recently took a short trip to the beach with her family.  Patient denies focal weakness. Patient denies recent trauma or falls.   Incidentally, patient also mentioned recent issues with left hip and lateral thigh pain, she reports history of bursitis in the past, states she received injection many years ago that did help to alleviate her pain. Patient states her pain  worsens with sitting and laying on left side at night.    Review of Systems  Cardiovascular:  Positive for leg swelling.  Musculoskeletal:  Positive for back pain.  Neurological:  Positive for tingling. Negative for focal weakness and weakness.  All other systems reviewed and are negative.  Otherwise per HPI.  Assessment & Plan: Visit Diagnoses:    ICD-10-CM   1. Lumbar radiculopathy  M54.16     2. Spinal stenosis of lumbar region with neurogenic claudication  M48.062     3. Facet hypertrophy of lumbar region  M47.816     4. Gait abnormality  R26.9     5. Greater trochanteric bursitis, left  M70.62        Plan: Findings:  1. Chronic bilateral lower back pain radiating to legs. Greater than 70% relief of pain with recent bilateral L4 transforaminal epidural steroid injection. Patient continues with physical therapy treatments, rest and medications. Patients clinical presentation and exam remain consistent with neurogenic claudication as a result of spinal canal stenosis. We believe the next step is to continue to monitor, we do not feel repeating epidural steroid injection at this time would be beneficial. If patient continues to get significant and sustained pain relief with epidural steroid injections we can repeat this infrequently as warranted. Patient encouraged to remain active as tolerated. No red flag symptoms noted upon exam today.   2. Left hip and lateral thigh pain. Patients clinical presentation and exam are consistent with greater trochanteric bursitis. Previous bursa injection many years did provide significant relief of pain. We did speak with patient in detail about plan of  care. We believe the next step is to have physical therapy work with her to alleviate pain from bursitis. We recommend manual treatments and possible iontophoresis. If her pain persists post physical therapy treatments we would consider performed left greater trochanter injection under fluoroscopic  guidance.     Meds & Orders: No orders of the defined types were placed in this encounter.  No orders of the defined types were placed in this encounter.   Follow-up: Return if symptoms worsen or fail to improve.   Procedures: No procedures performed      Clinical History: EXAM: MRI LUMBAR SPINE WITHOUT CONTRAST   TECHNIQUE: Multiplanar, multisequence MR imaging of the lumbar spine was performed. No intravenous contrast was administered.   COMPARISON:  Radiographs 11/11/2019.  MRI 07/12/2012.   FINDINGS: Segmentation: Conventional anatomy assumed, with the last open disc space designated L5-S1.Concordant with previous imaging.   Alignment: Mildly progressive degenerative anterolisthesis at L4-5, measuring approximately 4 mm. Otherwise normal.   Vertebrae: No worrisome osseous lesion, acute fracture or pars defect. The lumbar pedicles are short on a congenital basis. There are scattered endplate degenerative changes.   Conus medullaris: Extends to the L1 level and appears normal.   Paraspinal and other soft tissues: There is some atrophy of the erector spinae musculature with nonspecific dependent subcutaneous edema. No significant anterior paraspinal abnormalities.   Disc levels:   T12-L1: Mildly progressive disc bulging eccentric to the left without resulting spinal stenosis or nerve root encroachment. Mild bilateral facet hypertrophy.   L1-2: Progressive loss of disc height with progressive annular disc bulging eccentric to the left. There is a large extruded disc fragment within the left L2 lateral recess extending from the L1-2 disc to the L2-3 disc, favored to arise from the L2-3 level, further described below. There is moderate facet and ligamentous hypertrophy with bilateral facet joint effusions. At the level of the L1-2 disc, there is moderate spinal stenosis with mild asymmetric narrowing of the left lateral recess and left foramen.   L2-3: Progressive  loss of disc height with annular disc bulging and anterior endplate degenerative changes. As above, there is a large extruded disc fragment within the left L2 lateral recess, extending from the L1-2 disc to the L2-3 disc, favored to arise from the L2-3 level. This exerts significant mass effect on the thecal sac and likely results in left-sided nerve root encroachment. There is progressive moderate facet and ligamentous hypertrophy with bilateral facet joint effusions. At the level of the disc, there is moderate foraminal narrowing bilaterally.   L3-4: Mildly progressive loss of disc height with annular disc bulging. Progressive facet and ligamentous hypertrophy with bilateral facet joint effusions. Progressive multifactorial spinal stenosis, now moderate to severe. The foramina appear only mildly narrowed without exiting L3 nerve root encroachment.   L4-5: Similar loss of disc height with annular disc bulging and uncovering related to the degenerative anterolisthesis. There is advanced bilateral facet hypertrophy. These factors contribute to severe multifactorial spinal stenosis, similar to the previous study. There is moderate narrowing of both lateral recesses. There is mild foraminal narrowing, right greater than left.   L5-S1: Disc height and hydration are relatively maintained. The right facet joint appears ankylosed. No significant spinal stenosis or nerve root encroachment.   IMPRESSION: 1. Large extruded disc fragment within the left L2 lateral recess extending from the L1-2 disc to the L2-3 disc, favored to arise from the L2-3 level. This exerts significant mass effect on the thecal sac and likely results in left-sided  nerve root encroachment. 2. Progressive multilevel spondylosis superimposed on a congenitally small spinal canal. There is resulting moderate spinal stenosis at L1-2 and L2-3, moderate to severe spinal stenosis at L3-4 and severe spinal stenosis at  L4-5. 3. Progressive facet disease as described. No acute osseous findings.     Electronically Signed   By: Richardean Sale M.D.   On: 12/28/2019 13:45   She reports that she has never smoked. She has never used smokeless tobacco.  Recent Labs    04/02/21 0056  HGBA1C 6.5*    Objective:  VS:  HT:    WT:   BMI:     BP:   HR: bpm  TEMP: ( )  RESP:  Physical Exam Vitals and nursing note reviewed.  HENT:     Head: Normocephalic and atraumatic.     Right Ear: External ear normal.     Left Ear: External ear normal.     Nose: Nose normal.     Mouth/Throat:     Mouth: Mucous membranes are moist.  Eyes:     Extraocular Movements: Extraocular movements intact.  Cardiovascular:     Rate and Rhythm: Normal rate.     Pulses: Normal pulses.  Pulmonary:     Effort: Pulmonary effort is normal.  Abdominal:     General: Abdomen is flat. There is no distension.  Musculoskeletal:        General: Tenderness present.     Cervical back: Normal range of motion.     Right lower leg: Edema present.     Left lower leg: Edema present.     Comments: Pt rises from seated position to standing without difficulty. Good lumbar range of motion. Strong distal strength without clonus, no pain upon palpation of greater trochanters. Sensation intact bilaterally. 2+ bilateral lower extremity edema noted. Using wheelchair to assist with ambulation.  Skin:    General: Skin is warm and dry.     Capillary Refill: Capillary refill takes less than 2 seconds.  Neurological:     Mental Status: She is alert and oriented to person, place, and time.     Gait: Gait abnormal.  Psychiatric:        Mood and Affect: Mood normal.        Behavior: Behavior normal.     Ortho Exam  Imaging: No results found.  Past Medical/Family/Surgical/Social History: Medications & Allergies reviewed per EMR, new medications updated. Patient Active Problem List   Diagnosis Date Noted   Pulmonary embolism ([place]) 11/15/2021    Tachycardia 10/22/2021   Pulmonary HTN ([place])    Unspecified protein-calorie malnutrition ([place]) 04/05/2021   Pressure injury of skin 03/31/2021   UTI (urinary tract infection) 03/31/2021   Sepsis due to urinary tract infection ([place]) 03/30/2021   Acute encephalopathy 03/30/2021   Thrombocytopenia ([place]) 03/30/2021   Diabetic neuropathy ([place]) 05/24/2019   Hypokalemia 04/21/2019   Anticoagulated 06/25/2018   Chronic pain 06/25/2018   CRI (chronic renal insufficiency), stage 3 (moderate) (HCC) 06/25/2018   Drug-induced constipation 06/18/2018   DVT (deep venous thrombosis) ([place]) 06/03/2018   Primary osteoarthritis of both knees 05/26/2018   Body mass index (BMI) of 50-59.9 in adult ([place]) 05/26/2018   Acquired hypothyroidism 05/26/2018   Chronic bilateral low back pain without sciatica 02/17/2018   Astigmatism with presbyopia, bilateral 03/26/2017   Cortical age-related cataract of both eyes 03/26/2017   Family history of glaucoma 03/26/2017   Long term current use of oral hypoglycemic drug 03/26/2017   Nuclear sclerotic cataract  of both eyes 03/26/2017   Pain in left hip 02/18/2017   Closed nondisplaced intertrochanteric fracture of left femur with delayed healing 01/14/2017   Gout 11/29/2016   Depression 11/29/2016   Closed left hip fracture ([place]) 11/29/2016   GERD (gastroesophageal reflux disease) 11/29/2016   Closed intertrochanteric fracture of hip, left, initial encounter ([place]) 11/29/2016   Physical deconditioning 07/31/2015   Pulmonary fibrosis, postinflammatory ([place]) 07/31/2015   Bronchiectasis without complication ([place]) 80/22/3361   Tendinitis of left rotator cuff 06/13/2015   Chronic diastolic CHF (congestive heart failure) ([place]) 04/21/2015   Acute kidney injury superimposed on chronic kidney disease ([place]) 04/17/2015   Chronic respiratory failure with hypoxia ([place]) 02/09/2015   Dyspnea and respiratory abnormality 01/03/2015   Normal coronary arteries 11/02/2014   Morbid  obesity ([place]) 10/30/2014   Dysphagia 08/21/2014   Chronic fatigue 06/21/2014   DOE (dyspnea on exertion) 05/24/2014   Primary osteoarthritis of left knee 04/26/2014   High risk medication use 04/17/2014   Aphasia 02/12/2014   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 ([place]) 01/18/2014   Lumbar facet arthropathy 01/18/2014   Bilateral edema of lower extremity 05/30/2013   Chronic cough 05/24/2013   ILD (interstitial lung disease) ([place]) 04/10/2013   Spinal stenosis of lumbar region with radiculopathy 07/12/2012   Inability to walk 07/12/2012   Meningioma of left sphenoid wing involving cavernous sinus ([place]) 02/17/2012   Chest pain of uncertain etiology 22/44/9753   Rheumatoid arthritis ([place]) 01/28/2012   Primary osteoarthritis of right knee 01/28/2012   Dyslipidemia    Thyroid disease    Essential hypertension    Past Medical History:  Diagnosis Date   Acute on chronic diastolic (congestive) heart failure (HCC)    Anemia    iron deficiency anemia - secondary to blood loss ( chronic)    Anxiety    Arthritis    endstage changes bilateral knees/bilateral ankles.    Asthma    Carotid artery occlusion    Chronic fatigue    Chronic kidney disease    Closed left hip fracture (HCC)    Clotting disorder ([place])    pt denies this   Contusion of left knee    due to fall 1/14.   COPD (chronic obstructive pulmonary disease) (HCC)    pulmonary fibrosis   Depression, reactive    Diabetes mellitus    type II    Diastolic dysfunction    Difficulty in walking    Family history of heart disease    Generalized muscle weakness    Gout    High cholesterol    History of falling    Hypertension    Hypothyroidism    Interstitial lung disease ([place])    Meningioma of left sphenoid wing involving cavernous sinus ([place]) 02/17/2012   Continue diplopia, left eye pain and left headaches.     Morbid obesity ([place])    Neuromuscular disorder ([place])    diabetic neuropathy    Normal coronary  arteries    cardiac catheterization performed  10/31/14   Pneumonia    RA (rheumatoid arthritis) ([place])    has been off methotreaxte since 10/13.   Spinal stenosis of lumbar region    Thyroid disease    Unspecified lack of coordination    URI (upper respiratory infection)    Family History  Problem Relation Age of Onset   Diabetes Mother    Heart attack Mother    Hypertension Father    Lung cancer Father    Diabetes Sister  Breast cancer Sister    Breast cancer Sister    Diabetes Brother    Hypertension Brother    Heart disease Brother    Heart attack Brother    Kidney cancer Brother    Gout Brother    Kidney failure Brother        x 5   Esophageal cancer Brother    Stomach cancer Brother    Prostate cancer Brother    Uterine cancer Daughter    Hypertension Daughter    Hypertension Daughter    Rheum arthritis Maternal Uncle    Hypertension Son    Heart murmur Son    Past Surgical History:  Procedure Laterality Date   ABDOMINAL HYSTERECTOMY     BRAIN SURGERY     Gamma knife 10/13. Needs repeat spring  '14   CARDIAC CATHETERIZATION N/A 10/31/2014   Procedure: Right/Left Heart Cath and Coronary Angiography;  Surgeon: [name], MD;  Location: [facility];  Service: Cardiovascular;  Laterality: N/A;   CYST EXCISION     2 on face   CYST REMOVAL NECK     ESOPHAGOGASTRODUODENOSCOPY (EGD) WITH PROPOFOL N/A 09/14/2014   Procedure: ESOPHAGOGASTRODUODENOSCOPY (EGD) WITH PROPOFOL;  Surgeon: [name], MD;  Location: [facility];  Service: Endoscopy;  Laterality: N/A;   INCISION AND DRAINAGE HIP Left 01/16/2017   Procedure: IRRIGATION AND DEBRIDEMENT LEFT HIP;  Surgeon: [name], MD;  Location: [facility];  Service: Orthopedics;  Laterality: Left;   INTRAMEDULLARY (IM) NAIL INTERTROCHANTERIC Left 11/29/2016   Procedure: INTRAMEDULLARY (IM) NAIL INTERTROCHANTRIC;  Surgeon: [name], MD;  Location: [facility];  Service: Orthopedics;   Laterality: Left;   OVARY SURGERY     RIGHT HEART CATH N/A 08/12/2021   Procedure: RIGHT HEART CATH;  Surgeon: Lorretta Harp, MD;  Location: [facility];  Service: Cardiovascular;  Laterality: N/A;   SHOULDER SURGERY Left    TONSILLECTOMY  age 24   VIDEO BRONCHOSCOPY Bilateral 05/31/2013   Procedure: VIDEO BRONCHOSCOPY WITHOUT FLUORO;  Surgeon: Brand Males, MD;  Location: [facility];  Service: Cardiopulmonary;  Laterality: Bilateral;   video bronscoscopy  05/19/1998   lung   Social History   Occupational History   Occupation: retired Marine scientist  Tobacco Use   Smoking status: Never   Smokeless tobacco: Never  Vaping Use   Vaping Use: Never used  Substance and Sexual Activity   Alcohol use: No   Drug use: No   Sexual activity: Not Currently

## 2021-12-02 ENCOUNTER — Encounter: Payer: Medicare Other | Attending: Physical Medicine & Rehabilitation | Admitting: Registered Nurse

## 2021-12-02 ENCOUNTER — Encounter: Payer: Self-pay | Admitting: Registered Nurse

## 2021-12-02 VITALS — BP 158/94 | HR 91 | Ht 64.0 in | Wt 219.0 lb

## 2021-12-02 DIAGNOSIS — G8929 Other chronic pain: Secondary | ICD-10-CM

## 2021-12-02 DIAGNOSIS — M5416 Radiculopathy, lumbar region: Secondary | ICD-10-CM | POA: Diagnosis not present

## 2021-12-02 DIAGNOSIS — M545 Low back pain, unspecified: Secondary | ICD-10-CM | POA: Diagnosis not present

## 2021-12-02 DIAGNOSIS — M170 Bilateral primary osteoarthritis of knee: Secondary | ICD-10-CM | POA: Diagnosis not present

## 2021-12-02 DIAGNOSIS — Z5181 Encounter for therapeutic drug level monitoring: Secondary | ICD-10-CM | POA: Diagnosis not present

## 2021-12-02 DIAGNOSIS — M546 Pain in thoracic spine: Secondary | ICD-10-CM | POA: Insufficient documentation

## 2021-12-02 DIAGNOSIS — M2550 Pain in unspecified joint: Secondary | ICD-10-CM

## 2021-12-02 DIAGNOSIS — M7061 Trochanteric bursitis, right hip: Secondary | ICD-10-CM | POA: Diagnosis not present

## 2021-12-02 DIAGNOSIS — M7062 Trochanteric bursitis, left hip: Secondary | ICD-10-CM

## 2021-12-02 DIAGNOSIS — Z79891 Long term (current) use of opiate analgesic: Secondary | ICD-10-CM

## 2021-12-02 DIAGNOSIS — G894 Chronic pain syndrome: Secondary | ICD-10-CM

## 2021-12-02 MED ORDER — GABAPENTIN 600 MG PO TABS: 1200.0000 mg | ORAL_TABLET | Freq: Three times a day (TID) | ORAL | 1 refills | Status: DC

## 2021-12-02 MED ORDER — MORPHINE SULFATE 15 MG PO TABS: ORAL_TABLET | ORAL | 0 refills | Status: DC

## 2021-12-02 MED ORDER — MORPHINE SULFATE ER 15 MG PO TBCR: 15.0000 mg | EXTENDED_RELEASE_TABLET | Freq: Two times a day (BID) | ORAL | 0 refills | Status: DC

## 2021-12-02 MED ORDER — MORPHINE SULFATE 15 MG PO TABS
ORAL_TABLET | ORAL | 0 refills | Status: DC
Start: 2021-12-02 — End: 2022-01-10

## 2021-12-02 MED ORDER — CYCLOBENZAPRINE HCL 5 MG PO TABS: 5.0000 mg | ORAL_TABLET | Freq: Every evening | ORAL | 1 refills | Status: DC | PRN

## 2021-12-02 MED ORDER — DULOXETINE HCL 60 MG PO CPEP: 60.0000 mg | ORAL_CAPSULE | Freq: Every day | ORAL | 1 refills | Status: DC

## 2021-12-02 NOTE — Progress Notes (Signed)
Subjective:    Patient ID: [name], female    DOB: March 25, 1941, 81 y.o.   MRN: [id number]  HPI: [name] is a 81 y.o. female who returns for follow up appointment for chronic pain and medication refill. She states her pain is located in her bilateral shoulders, mid- lower back pain radiating into her bilateral lower extremities L>R and bilateral ankle pain. She rates her pain 4. Her current exercise regime is physical therapy two days a week and walking short distances in her home.  [name] Morphine equivalent is 60.00 MME.   Last UDS was Performed on 09/17/2021, it was consistent.     Pain Inventory Average Pain 6 Pain Right Now 4 My pain is sharp, burning, tingling, and aching  In the last 24 hours, has pain interfered with the following? General activity 7 Relation with others 6 Enjoyment of life 6 What TIME of day is your pain at its worst? morning  Sleep (in general) Fair  Pain is worse with: walking, bending, and standing Pain improves with: rest, heat/ice, therapy/exercise, pacing activities, and medication Relief from Meds: 7  Family History  Problem Relation Age of Onset   Diabetes Mother    Heart attack Mother    Hypertension Father    Lung cancer Father    Diabetes Sister    Breast cancer Sister    Breast cancer Sister    Diabetes Brother    Hypertension Brother    Heart disease Brother    Heart attack Brother    Kidney cancer Brother    Gout Brother    Kidney failure Brother        x 5   Esophageal cancer Brother    Stomach cancer Brother    Prostate cancer Brother    Uterine cancer Daughter    Hypertension Daughter    Hypertension Daughter    Rheum arthritis Maternal Uncle    Hypertension Son    Heart murmur Son    Social History   Socioeconomic History   Marital status: Married    Spouse name: Not on file   Number of children: 6   Years of education: college   Highest education level: Not on file  Occupational History    Occupation: retired Marine scientist  Tobacco Use   Smoking status: Never   Smokeless tobacco: Never  Vaping Use   Vaping Use: Never used  Substance and Sexual Activity   Alcohol use: No   Drug use: No   Sexual activity: Not Currently  Other Topics Concern   Not on file  Social History Narrative   Patient consumes 2-3 cups coffee per day.   Social Determinants of Health   Financial Resource Strain: Low Risk  (10/23/2021)   Overall Financial Resource Strain (CARDIA)    Difficulty of Paying Living Expenses: Not hard at all  Food Insecurity: No Food Insecurity (10/23/2021)   Hunger Vital Sign    Worried About Running Out of Food in the Last Year: Never true    Ran Out of Food in the Last Year: Never true  Transportation Needs: No Transportation Needs (10/23/2021)   PRAPARE - Hydrologist (Medical): No    Lack of Transportation (Non-Medical): No  Physical Activity: Insufficiently Active (10/23/2021)   Exercise Vital Sign    Days of Exercise per Week: 2 days    Minutes of Exercise per Session: 40 min  Stress: No Stress Concern Present (10/18/2020)   [employer] [place]    Feeling of Stress : Not at all  Social Connections: Socially Integrated (10/23/2021)   Social Connection and Isolation Panel [NHANES]    Frequency of Communication with Friends and Family: More than three times a week    Frequency of Social Gatherings with Friends and Family: More than three times a week    Attends Religious Services: More than 4 times per year    Active Member of [employer] or Organizations: Yes    Attends Music therapist: More than 4 times per year    Marital Status: Married   Past Surgical History:  Procedure Laterality Date   ABDOMINAL HYSTERECTOMY     BRAIN SURGERY     Gamma knife 10/13. Needs repeat spring  '14   CARDIAC CATHETERIZATION N/A 10/31/2014   Procedure: Right/Left Heart Cath and Coronary  Angiography;  Surgeon: [name], MD;  Location: [facility];  Service: Cardiovascular;  Laterality: N/A;   CYST EXCISION     2 on face   CYST REMOVAL NECK     ESOPHAGOGASTRODUODENOSCOPY (EGD) WITH PROPOFOL N/A 09/14/2014   Procedure: ESOPHAGOGASTRODUODENOSCOPY (EGD) WITH PROPOFOL;  Surgeon: [name], MD;  [facility]: [facility];  Service: Endoscopy;  Laterality: N/A;   INCISION AND DRAINAGE HIP Left 01/16/2017   Procedure: IRRIGATION AND DEBRIDEMENT LEFT HIP;  Surgeon: [name], MD;  Location: [facility];  Service: Orthopedics;  Laterality: Left;   INTRAMEDULLARY (IM) NAIL INTERTROCHANTERIC Left 11/29/2016   Procedure: INTRAMEDULLARY (IM) NAIL INTERTROCHANTRIC;  Surgeon: [name], MD;  Location: [facility];  Service: Orthopedics;  Laterality: Left;   OVARY SURGERY     RIGHT HEART CATH N/A 08/12/2021   Procedure: RIGHT HEART CATH;  Surgeon: Lorretta Harp, MD;  Location: [facility];  Service: Cardiovascular;  Laterality: N/A;   SHOULDER SURGERY Left    TONSILLECTOMY  age 75   VIDEO BRONCHOSCOPY Bilateral 05/31/2013   Procedure: VIDEO BRONCHOSCOPY WITHOUT FLUORO;  Surgeon: Brand Males, MD;  Location: [facility];  Service: Cardiopulmonary;  Laterality: Bilateral;   video bronscoscopy  05/19/1998   lung   Past Surgical History:  Procedure Laterality Date   ABDOMINAL HYSTERECTOMY     BRAIN SURGERY     Gamma knife 10/13. Needs repeat spring  '14   CARDIAC CATHETERIZATION N/A 10/31/2014   Procedure: Right/Left Heart Cath and Coronary Angiography;  Surgeon: [name], MD;  Location: [facility];  Service: Cardiovascular;  Laterality: N/A;   CYST EXCISION     2 on face   CYST REMOVAL NECK     ESOPHAGOGASTRODUODENOSCOPY (EGD) WITH PROPOFOL N/A 09/14/2014   Procedure: ESOPHAGOGASTRODUODENOSCOPY (EGD) WITH PROPOFOL;  Surgeon: [name], MD;  [facility];  Service: Endoscopy;  Laterality: N/A;    INCISION AND DRAINAGE HIP Left 01/16/2017   Procedure: IRRIGATION AND DEBRIDEMENT LEFT HIP;  Surgeon: [name], MD;  [facility];  Service: Orthopedics;  Laterality: Left;   INTRAMEDULLARY (IM) NAIL INTERTROCHANTERIC Left 11/29/2016   Procedure: INTRAMEDULLARY (IM) NAIL INTERTROCHANTRIC;  Surgeon: [name], MD;  Location: [facility];  Service: Orthopedics;  Laterality: Left;   OVARY SURGERY     RIGHT HEART CATH N/A 08/12/2021   Procedure: RIGHT HEART CATH;  Surgeon: Lorretta Harp, MD;  Location: [facility];  Service: Cardiovascular;  Laterality: N/A;   SHOULDER SURGERY Left    TONSILLECTOMY  age 45   VIDEO BRONCHOSCOPY Bilateral  05/31/2013   Procedure: VIDEO BRONCHOSCOPY WITHOUT FLUORO;  Surgeon: Brand Males, MD;  Location: [facility];  Service: Cardiopulmonary;  Laterality: Bilateral;   video bronscoscopy  05/19/1998   lung   Past Medical History:  Diagnosis Date   Acute on chronic diastolic (congestive) heart failure (HCC)    Anemia    iron deficiency anemia - secondary to blood loss ( chronic)    Anxiety    Arthritis    endstage changes bilateral knees/bilateral ankles.    Asthma    Carotid artery occlusion    Chronic fatigue    Chronic kidney disease    Closed left hip fracture (HCC)    Clotting disorder ([place])    pt denies this   Contusion of left knee    due to fall 1/14.   COPD (chronic obstructive pulmonary disease) (HCC)    pulmonary fibrosis   Depression, reactive    Diabetes mellitus    type II    Diastolic dysfunction    Difficulty in walking    Family history of heart disease    Generalized muscle weakness    Gout    High cholesterol    History of falling    Hypertension    Hypothyroidism    Interstitial lung disease ([place])    Meningioma of left sphenoid wing involving cavernous sinus ([place]) 02/17/2012   Continue diplopia, left eye pain and left headaches.     Morbid obesity ([place])    Neuromuscular disorder  ([place])    diabetic neuropathy    Normal coronary arteries    cardiac catheterization performed  10/31/14   Pneumonia    RA (rheumatoid arthritis) ([place])    has been off methotreaxte since 10/13.   Spinal stenosis of lumbar region    Thyroid disease    Unspecified lack of coordination    URI (upper respiratory infection)    BP (!) 162/102   Pulse 94   Ht 5' 4" (1.626 m) Comment: last recorded  Wt 219 lb (99.3 kg) Comment: last recorded  SpO2 92% Comment: 3 L [place]  BMI 37.59 kg/m   Opioid Risk Score:   Fall Risk Score:  `1  Depression screen [facility] 2/9     12/02/2021   11:22 AM 10/23/2021   11:52 AM 09/25/2021    3:21 PM 08/19/2021   10:55 AM 07/22/2021   11:04 AM 06/21/2021   10:10 AM 05/22/2021    2:52 PM  Depression screen PHQ 2/9  Decreased Interest 0 0 1 2 0 1 1  Down, Depressed, Hopeless 0 0 1 1 0 0 1  PHQ - 2 Score 0 0 2 3 0 1 2  Altered sleeping  0 0 _0 Tired, decreased energy  0 _1 Change in appetite  0 _2 Feeling bad or failure about yourself   0 _3 0  Trouble concentrating  0 _4 0  Moving slowly or fidgety/restless  0 2 2  0 0  Suicidal thoughts  0 0 0  0 0  PHQ-9 Score  0 _5 Difficult doing work/chores    Extremely dIfficult        Review of Systems  Constitutional: Negative.   HENT: Negative.    Eyes: Negative.   Respiratory: Negative.    Cardiovascular: Negative.   Gastrointestinal: Negative.   Endocrine: Negative.   Genitourinary: Negative.   Musculoskeletal:  Positive for arthralgias, back pain and myalgias.  Skin: Negative.   Allergic/Immunologic: Negative.   Neurological: Negative.   Hematological: Negative.   Psychiatric/Behavioral: Negative.    All other systems reviewed and are negative.      Objective:   Physical Exam Vitals and nursing note reviewed.  Constitutional:      Appearance: Normal appearance.  Cardiovascular:     Rate and Rhythm: Normal rate and regular rhythm.     Pulses: Normal pulses.     Heart  sounds: Normal heart sounds.  Pulmonary:     Effort: Pulmonary effort is normal.     Breath sounds: Normal breath sounds.     Comments: Continuous Oxygen @ 3 liters nasal cannula  Musculoskeletal:     Cervical back: Normal range of motion and neck supple.     Comments: Normal Muscle Bulk and Muscle Testing Reveals:  Upper Extremities: Full ROM and Muscle Strength 5/5 Bilateral AC Joint Tenderness  Thoracic and Lumbar Hypersensitivity Lower Extremities: Decreased ROM and Muscle Strength 4/5 Arrived in wheelchair     Skin:    General: Skin is warm and dry.  Neurological:     Mental Status: She is alert and oriented to person, place, and time.  Psychiatric:        Mood and Affect: Mood normal.        Behavior: Behavior normal.         Assessment & Plan:  1. Lumbar spinal stenosis with neurogenic claudication. Associated facet arthropathy/ Lumbar Radiculitis: Continue current medication regime with Gabapentin and HEP as tolerated. 12/02/2021 Refilled: MS Contin 15 mg one tablet every 12 hours #60 and  MSIR 15 one tablet twice a day as needed  For break through pain ( 6 am and 1:00 pm)  #60.since adverse effect with insomnia with Nucynta. Nucynta discontinued due to insomnia.  We will continue the opioid monitoring program, this consists of regular clinic visits, examinations, urine drug screen, pill counts as well as use of [place] Controlled Substance Reporting system. A 12 month History has been reviewed on the [place] Controlled Substance Reporting System 12/02/2021 2. Chronic Bilateral Thoracic Back Pain: Continue HEP as Tolerated. Continue to Monitor. 12/02/2021 3 Depression: Continue current medication Regime: Cymbalta . Continue to monitor.12/02/2021 4. Diabetes mellitus type 2 with polyneuropathy: Continue current medication regime: Gabapentin. 12/02/2021 5. Rheumatoid arthritis and osteoarthritis. Continue current medication Regime.  Voltaren Gel. Rheumatology  Following. 12/02/2021 6. Interstitial lung disease: Pulmonology Following. 12/02/2021. 7. Bilateral Osteoarthritis Knee's:  Continue current medication regimen. Voltaren Gel. 12/02/2021. 8. Muscle Spasm: Continue current medication regime : Flexeril. Continue to monitor.12/02/2021 9. Bilateral Greater Trochanteric Tenderness: L>R. Continue current treatment with Ice/Heat Therapy. 12/02/2021. 10. Polyarthralgia: Rheumatology Following: Continue to monitor.12/02/2021 11. Morbid Obesitity: Continue Healthy Diet Regime and HEP. Continue to monitor.  12/02/2021 12. Cervicalgia/Cervical Radiculitis: Continue to Alternate with Heat and Ice Therapy. Continue to monitor. 12/02/2021     F/U in 2 months

## 2021-12-03 ENCOUNTER — Other Ambulatory Visit: Payer: Medicare Other | Admitting: Nurse Practitioner

## 2021-12-03 DIAGNOSIS — R0602 Shortness of breath: Secondary | ICD-10-CM | POA: Diagnosis not present

## 2021-12-03 DIAGNOSIS — R5381 Other malaise: Secondary | ICD-10-CM

## 2021-12-03 DIAGNOSIS — Z515 Encounter for palliative care: Secondary | ICD-10-CM

## 2021-12-04 ENCOUNTER — Encounter: Payer: Self-pay | Admitting: Nurse Practitioner
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
## 2021-12-05 ENCOUNTER — Encounter: Payer: Self-pay | Admitting: Rehabilitative and Restorative Service Providers"

## 2021-12-05 ENCOUNTER — Ambulatory Visit (INDEPENDENT_AMBULATORY_CARE_PROVIDER_SITE_OTHER): Payer: Medicare Other | Admitting: Rehabilitative and Restorative Service Providers"

## 2021-12-05 DIAGNOSIS — M25552 Pain in left hip: Secondary | ICD-10-CM

## 2021-12-05 DIAGNOSIS — R262 Difficulty in walking, not elsewhere classified: Secondary | ICD-10-CM

## 2021-12-05 DIAGNOSIS — R293 Abnormal posture: Secondary | ICD-10-CM | POA: Diagnosis not present

## 2021-12-05 DIAGNOSIS — M6281 Muscle weakness (generalized): Secondary | ICD-10-CM | POA: Diagnosis not present

## 2021-12-05 DIAGNOSIS — M5459 Other low back pain: Secondary | ICD-10-CM | POA: Diagnosis not present

## 2021-12-05 NOTE — Therapy (Signed)
OUTPATIENT PHYSICAL THERAPY TREATMENT NOTE   Patient Name: [name] MRN: [id number] DOB:Jan 21, 1941, 81 y.o., female Today's Date: 12/05/2021  PCP: Martyn Ehrich, NP REFERRING PROVIDER: [name], MD    END OF SESSION:   PT End of Session - 12/05/21 1433     Visit Number 13    Number of Visits 20    Date for PT Re-Evaluation 01/06/22    Authorization Type Medicare and [place] at 15    Progress Note Due on Visit 20    PT Start Time 1433    PT Stop Time 1517    PT Time Calculation (min) 44 min    Activity Tolerance Patient limited by fatigue;Patient limited by pain    Behavior During Therapy WFL for tasks assessed/performed             Past Medical History:  Diagnosis Date   Acute on chronic diastolic (congestive) heart failure (HCC)    Anemia    iron deficiency anemia - secondary to blood loss ( chronic)    Anxiety    Arthritis    endstage changes bilateral knees/bilateral ankles.    Asthma    Carotid artery occlusion    Chronic fatigue    Chronic kidney disease    Closed left hip fracture (HCC)    Clotting disorder ([place])    pt denies this   Contusion of left knee    due to fall 1/14.   COPD (chronic obstructive pulmonary disease) (HCC)    pulmonary fibrosis   Depression, reactive    Diabetes mellitus    type II    Diastolic dysfunction    Difficulty in walking    Family history of heart disease    Generalized muscle weakness    Gout    High cholesterol    History of falling    Hypertension    Hypothyroidism    Interstitial lung disease ([place])    Meningioma of left sphenoid wing involving cavernous sinus ([place]) 02/17/2012   Continue diplopia, left eye pain and left headaches.     Morbid obesity ([place])    Neuromuscular disorder ([place])    diabetic neuropathy    Normal coronary arteries    cardiac catheterization performed  10/31/14   Pneumonia    RA (rheumatoid arthritis) ([place])    has been off methotreaxte since 10/13.    Spinal stenosis of lumbar region    Thyroid disease    Unspecified lack of coordination    URI (upper respiratory infection)    Past Surgical History:  Procedure Laterality Date   ABDOMINAL HYSTERECTOMY     BRAIN SURGERY     Gamma knife 10/13. Needs repeat spring  '14   CARDIAC CATHETERIZATION N/A 10/31/2014   Procedure: Right/Left Heart Cath and Coronary Angiography;  Surgeon: [name], MD;  Location: [facility];  Service: Cardiovascular;  Laterality: N/A;   CYST EXCISION     2 on face   CYST REMOVAL NECK     ESOPHAGOGASTRODUODENOSCOPY (EGD) WITH PROPOFOL N/A 09/14/2014   Procedure: ESOPHAGOGASTRODUODENOSCOPY (EGD) WITH PROPOFOL;  Surgeon: [name], MD;  Location: [facility];  Service: Endoscopy;  Laterality: N/A;   INCISION AND DRAINAGE HIP Left 01/16/2017   Procedure: IRRIGATION AND DEBRIDEMENT LEFT HIP;  Surgeon: [name], MD;  Location: [facility];  Service: Orthopedics;  Laterality: Left;   INTRAMEDULLARY (IM) NAIL INTERTROCHANTERIC Left 11/29/2016   Procedure: INTRAMEDULLARY (IM) NAIL INTERTROCHANTRIC;  Surgeon: Jean Rosenthal  Y, MD;  Location: [facility];  Service: Orthopedics;  Laterality: Left;   OVARY SURGERY     RIGHT HEART CATH N/A 08/12/2021   Procedure: RIGHT HEART CATH;  Surgeon: Lorretta Harp, MD;  Location: [facility];  Service: Cardiovascular;  Laterality: N/A;   SHOULDER SURGERY Left    TONSILLECTOMY  age 36   VIDEO BRONCHOSCOPY Bilateral 05/31/2013   Procedure: VIDEO BRONCHOSCOPY WITHOUT FLUORO;  Surgeon: Brand Males, MD;  Location: [facility];  Service: Cardiopulmonary;  Laterality: Bilateral;   video bronscoscopy  05/19/1998   lung   Patient Active Problem List   Diagnosis Date Noted   Pulmonary embolism ([place]) 11/15/2021   Tachycardia 10/22/2021   Pulmonary HTN ([place])    Unspecified protein-calorie malnutrition ([place]) 04/05/2021   Pressure injury of skin 03/31/2021   UTI (urinary tract infection)  03/31/2021   Sepsis due to urinary tract infection ([place]) 03/30/2021   Acute encephalopathy 03/30/2021   Thrombocytopenia ([place]) 03/30/2021   Diabetic neuropathy ([place]) 05/24/2019   Hypokalemia 04/21/2019   Anticoagulated 06/25/2018   Chronic pain 06/25/2018   CRI (chronic renal insufficiency), stage 3 (moderate) (HCC) 06/25/2018   Drug-induced constipation 06/18/2018   DVT (deep venous thrombosis) ([place]) 06/03/2018   Primary osteoarthritis of both knees 05/26/2018   Body mass index (BMI) of 50-59.9 in adult ([place]) 05/26/2018   Acquired hypothyroidism 05/26/2018   Chronic bilateral low back pain without sciatica 02/17/2018   Astigmatism with presbyopia, bilateral 03/26/2017   Cortical age-related cataract of both eyes 03/26/2017   Family history of glaucoma 03/26/2017   Long term current use of oral hypoglycemic drug 03/26/2017   Nuclear sclerotic cataract of both eyes 03/26/2017   Pain in left hip 02/18/2017   Closed nondisplaced intertrochanteric fracture of left femur with delayed healing 01/14/2017   Gout 11/29/2016   Depression 11/29/2016   Closed left hip fracture ([place]) 11/29/2016   GERD (gastroesophageal reflux disease) 11/29/2016   Closed intertrochanteric fracture of hip, left, initial encounter ([place]) 11/29/2016   Physical deconditioning 07/31/2015   Pulmonary fibrosis, postinflammatory ([place]) 07/31/2015   Bronchiectasis without complication ([place]) 28/78/6767   Tendinitis of left rotator cuff 06/13/2015   Chronic diastolic CHF (congestive heart failure) ([place]) 04/21/2015   Acute kidney injury superimposed on chronic kidney disease ([place]) 04/17/2015   Chronic respiratory failure with hypoxia (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 ([place]) 01/18/2014   Lumbar facet arthropathy 01/18/2014   Bilateral edema of lower extremity 05/30/2013   Chronic cough 05/24/2013   ILD (interstitial lung disease) ([place]) 04/10/2013   Spinal stenosis of lumbar region with radiculopathy 07/12/2012   Inability to walk 07/12/2012   Meningioma of left sphenoid wing involving cavernous sinus ([place]) 02/17/2012   Chest pain of uncertain etiology 20/94/7096   Rheumatoid arthritis ([place]) 01/28/2012   Primary osteoarthritis of right knee 01/28/2012   Dyslipidemia    Thyroid disease    Essential hypertension     REFERRING PROVIDER: [name], MD   REFERRING DIAG: M54.50,G89.29 (ICD-10-CM) - Chronic bilateral low back pain without sciatica M25.552 (ICD-10-CM) - Pain in left hip  ONSET DATE:  Dec 2022  THERAPY DIAG:  Other low  back pain  Pain in left hip  Abnormal posture  Muscle weakness (generalized)  Difficulty in walking, not elsewhere classified  PERTINENT HISTORY: HTN, hypothyroidism, history of RA, COPD, DM, high cholesterol, CHF.  ORIF Lt femur from hip fracture "about 4-5 years ago"  PRECAUTIONS: May require supplemental oxygen  SUBJECTIVE:  She feels pain in bil anterior thighs and more so on the Lt. She feels occasional cramping in her thighs. Back and leg pain woke her up from sleep in the morning and took pain meds to relieve it.  PAIN:  Are you having pain? Yes:  NPRS scale: 4/10 currently, highest to 6/10 Pain location: Back Pain description: achy tightness Aggravating factors: standing/walking prolonged, lying down on back Relieving factors: Medicine, patch   OBJECTIVE:    PATIENT SURVEYS:  11/05/2021: update 32 10/08/2021: update 37 % 09/12/2021: update 43% 08/16/2021 FOTO intake: 33 predicted:  37   SCREENING FOR RED FLAGS: 08/16/2021 Bowel or bladder incontinence: No Cauda equina syndrome: No      COGNITION:            08/16/2021 Overall cognitive status: Within functional limits for tasks assessed                                 SENSATION: 08/16/2021 Light touch: WFL   MUSCLE LENGTH: 08/16/2021:  none tested today   POSTURE:  08/16/2021 : rounded shoulders, increased thoracic kyphosis, reduced lumbar lordosis in standing, forward trunk lean noted   PALPATION: 08/16/2021 : Tenderness across lower back bilateral, Lt > Rt, Lateral Lt hip tenderness to touch- trigger points noted 12/05/2021: trigger points and localized tenderness noted throughout bilateral quad (increased amount in lateral aspect of thigh), glute med/min bilateral, TFL, along IT band bilateral.  Also tender to touch on greater trochanter bilateral but reduced compared to myofascial palpation tenderness   LUMBAR ROM:    Active  AROM  08/16/2021 AROM 10/08/2021 AROM 11/05/2021  Flexion To mid thighs c pain    Extension To neutral, ERP lumbar noted.  X5 continued pain complaints at end range < 25 % but more than neutral c ERP noted Can get to neutral with knees straight  Right lateral flexion      Left lateral flexion      Right rotation      Left rotation       (Blank rows = not tested)   LE ROM:    PROM Right 09/02/2021 Left 09/02/2021 Right 10/16/2021 Left 10/16/2021  Hip flexion  95     Hip extension        Hip abduction        Hip adduction        Hip internal rotation  25 in 90 deg flexion 35 in 90 deg flexion  23 in 90 deg flexion seated 45 in 90 deg flexion seated  Hip external rotation  55 in 90 deg flexion 45 in 90 deg flexion  57 in 90 deg flexion seated  45 in 90 deg flexion seated (pain in Lt knee reported)  Knee flexion        Knee extension        Ankle dorsiflexion        Ankle plantarflexion        Ankle inversion        Ankle eversion         (Blank rows = not tested)   LE MMT:  MMT Right 08/16/2021 Left 08/16/2021 Right 10/08/2021 Left 10/08/2021 Left 11/21/2021  Hip flexion 4/5 3+/5  5/5 4/5 5/5  Hip extension       Hip abduction       Hip adduction         Hip internal rotation         Hip external rotation         Knee flexion 5/5 5/5     Knee extension 5/5 4/5     Ankle dorsiflexion 5/5 5/5     Ankle plantarflexion         Ankle inversion         Ankle eversion          (Blank rows = not tested)   LUMBAR SPECIAL TESTS:  08/16/2021 (-) slump test bilateral   FUNCTIONAL TESTS:  11/11/2021:  TUG c rollator 16.87  10/08/2021:  TUG : c rollator 17.02 seconds   18 inch chair transfer s UE on 1st try  08/16/2021 TUG: c rollator 27.83 seconds   GAIT: 12/05/2021:  Rollator c forearms on handholds, < 100 ft today limited due to pain in Rt shoulder/back.   11/21/2021: 82 ft c rollator - limited by Rt knee pain  10/08/2021: 185 ft c rollator  09/10/2021:  Rollator 96 ft (stopped due to out of breath)  08/16/2021 Distance walked: Rollator household distances < 150 ft, modified independent                         Has wheelchair for community.        TODAY'S TREATMENT  12/05/2021  Therex:   Nustep UE/LE lvl 5 8 min (SpO2 range 88-97% with cues for deep breathing and 1 min rest during ex)   Seated scapular retraction 5 second hold x 10   Seated shoulder ER isometric hold with 0-90* flexion green band 2 x 5   Sit to stand from 18 inch chair x 5   Manual   STM c percussive device assist to bilateral glute med/min, quad c focus on anterior/lateral aspect of proximal and middle 1/3 for myofascial release techniques.  Education given on application of manual, background of how and why it may help symptoms, and options for STM at home and continued assessment of response during, pt verbalized understanding and returned demo of self-STM with tennis ball  11/21/2021 Therex:           Nustep UE/LE Lvl 5 10 mins    Double leg press 50 lbs x 20  Seated pelvic tilt anterior/posterior x 15  Seated scapular retraction 5 second hold x 5  Seated LAQ c pause in end range x 15  bilateral  Seated combo clam shell, bilateral UE ER 2 x 10 green band  Seated green band PF c bicep curl bilateral x 10 bilateral   Sit to stand from 18 inch chair x 5  11/11/2021 Therex:           Nustep UE/LE Lvl 5 15 mins  (monitoring O2 stats throughout c range from 92-95 % - cues given for deep breathing during, use of supplemental oxygen today in clinic).    Seated combo clam shell, bilateral UE ER 2 x 10 green band  Seated scapular retraction 5 second hold x 10  Standing green band rows c scapular retraction 2 x 10  Seated hip flexion c ipsilateral arm flexion x 10 bilateral  Seated reciprocal arm/leg flexion x 10 bilateral, ipsilateral  x 10 bilateral   TherActivity  Sit to stand to sit from 18 inch chair c slow sitting focus 2 x 15 no UE assist   TUG x 2 c rollator and SBA for functional movement training   11/05/2021 Trunk extension AROM/Shoulder blade pinch/Sit to stand combination 5X  Opposite arm and leg in sitting 10X 5 seconds B 2 sets  Pelvic rotation 10X 10 seconds (uncomfortable supine on the firm treatment table)  Bridging (attempted)  Functional Activities: gait mechanics and endurance work with the walker 3X 30-50 feet with rest breaks PRN Progress Note   10/21/2021 Therex:           Nustep UE/LE Lvl 5 10 mins  (monitoring O2 stats throughout c range from 88 to 96 % - cues given for deep breathing during).   Seated scapular retraction 5 second hold x 10  Standing green band rows c scapular retraction 2 x 10  Seated hip flexion c ipsilateral arm flexion x 10 bilateral  Supine bridge 3 sec hold x 15  Supine scapular retraction c GH extension in table 5 sec hold x 10  Seated cervical extension isometric 5 sec hold x 10     Rollator ambulation 100 ft c hands on handles instead of forearms.  Rest due to out of breath.  O2 sat was 92%.  Sitting and use of supplemental O2 raised to 97 with 2 mins rest.     PATIENT EDUCATION:  10/16/2021 Education details: HEP  update Person educated: Patient Education method: Consulting civil engineer, Demonstration, Verbal cues, and Handouts Education comprehension: verbalized understanding and returned demonstration     HOME EXERCISE PROGRAM: Access Code: [email or web address] URL: [email or web address] Date: 10/16/2021 Prepared by: Scot Jun  Exercises - Supine Lower Trunk Rotation  - 2-3 x daily - 7 x weekly - 1 sets - 2 reps - 15 hold - Hooklying Single Knee to Chest Stretch  - 2-3 x daily - 7 x weekly - 1 sets - 2 reps - 15 hold - Seated Scapular Retraction  - 2-3 x daily - 7 x weekly - 1 sets - 5-10 reps - 3-5 hold - Seated Hip Abduction with Resistance  - 1-2 x daily - 7 x weekly - 1-2 sets - 10-15 reps - Seated March with Same Side Arm Raise   - 1-2 x daily - 7 x weekly - 1-2 sets - 10 reps   ASSESSMENT:   CLINICAL IMPRESSION: She reported new onset hip/thigh pain and shoulder pain, so pt education included addition of STM at home to relieve sx. She felt limited by shoulder and back pain during walking upright, and walked in and out of clinic with forearms on handles of rollator. She continues to benefit from skilled PT to address mobility and activity limitations.    OBJECTIVE IMPAIRMENTS Abnormal gait, decreased activity tolerance, decreased balance, decreased coordination, decreased endurance, decreased mobility, difficulty walking, decreased ROM, decreased strength, hypomobility, impaired perceived functional ability, increased muscle spasms, impaired flexibility, improper body mechanics, postural dysfunction, obesity, and pain.    ACTIVITY LIMITATIONS cleaning, community activity, driving, meal prep, and laundry.    PERSONAL FACTORS 3+ comorbidities: HTN, hypothyroidism, history of RA, COPD, DM, high cholesterol, CHF  are also affecting patient's functional outcome.      REHAB POTENTIAL: Fair to good   CLINICAL DECISION MAKING: Evolving/moderate complexity   EVALUATION COMPLEXITY: Moderate      GOALS: Goals reviewed with patient? Yes   Short term PT Goals (target date for Short term goals are  3 weeks 09/06/2021) Patient will demonstrate independent use of home exercise program to maintain progress from in clinic treatments. Goal status: MET - assessed 09/10/2021   Long term PT goals (target dates for all long term goals are 10 weeks  11/22/2021 )   1. Patient will demonstrate/report pain at worst less than or equal to 2/10 to facilitate minimal limitation in daily activity secondary to pain symptoms. Goal status: On Going - assessed 12/05/2021  2. Patient will demonstrate independent use of home exercise program to facilitate ability to maintain/progress functional gains from skilled physical therapy services. Goal status: Met- assessed 11/05/2021   3. Patient will demonstrate FOTO outcome > or = 37 % to indicate reduced disability due to condition. Goal status: On Going- assessed 12/05/2021   4.  Patient will demonstrate TUG c rollator < 14 seconds to indicate reduced fall risk and improved community integration level.  Goal status: On Going  assessed 12/05/2021   5.  Patient will demonstrate lumbar ext AROM > or = 50% to facilitate upright standing/walking posture for household and community intergration.    Goal status: On Going - assessed 12/05/2021   6.  Patient will demonstrate bilateral knee MMT 5/5 throughout, Lt hip MMT 4/5 or greater throughout to facilitate daily movement at [facility].   Goal status: On Going - assessed 12/05/2021   7.  Patient will demonstrate ability to walk > 500 ft s resting to facilitate ability to ambulation in community.  a.  Goal Status: On Going - assessed 12/05/2021     PLAN: PT FREQUENCY: 2x/week   PT DURATION: 4 weeks   PLANNED INTERVENTIONS: Therapeutic exercises, Therapeutic activity, Neuro Muscular re-education, Balance training, Gait training, Patient/Family education, Joint mobilization, Stair training, DME instructions, Dry Needling,  Electrical stimulation, Cryotherapy, Moist heat, Taping, Ultrasound, Ionotophoresis 81m/ml Dexamethasone, and Manual therapy.  All included unless contraindicated.   PLAN FOR NEXT SESSION: include frequent changes of position if supine or side-lying for pain, continue progressive LE/core strengthening and standing/walking endurance, manual or stretching for sx relief  KJana Hakim SPT 12/05/21, 3:39 PM  This entire session of physical therapy was performed under the direct supervision of PT signing evaluation /treatment. PT reviewed note and agrees.  MScot Jun PT, DPT, OCS, ATC 12/05/21  3:41 PM

## 2021-12-09 ENCOUNTER — Encounter: Payer: Self-pay | Admitting: Family Medicine

## 2021-12-09 ENCOUNTER — Ambulatory Visit (INDEPENDENT_AMBULATORY_CARE_PROVIDER_SITE_OTHER): Payer: Medicare Other | Admitting: Family Medicine

## 2021-12-09 VITALS — BP 154/98 | HR 95 | Temp 98.1°F | Wt 226.4 lb

## 2021-12-09 DIAGNOSIS — E1140 Type 2 diabetes mellitus with diabetic neuropathy, unspecified: Secondary | ICD-10-CM | POA: Diagnosis not present

## 2021-12-09 DIAGNOSIS — K5909 Other constipation: Secondary | ICD-10-CM | POA: Diagnosis not present

## 2021-12-09 DIAGNOSIS — R58 Hemorrhage, not elsewhere classified: Secondary | ICD-10-CM

## 2021-12-09 DIAGNOSIS — M791 Myalgia, unspecified site: Secondary | ICD-10-CM | POA: Diagnosis not present

## 2021-12-09 DIAGNOSIS — E782 Mixed hyperlipidemia: Secondary | ICD-10-CM | POA: Diagnosis not present

## 2021-12-09 DIAGNOSIS — I890 Lymphedema, not elsewhere classified: Secondary | ICD-10-CM

## 2021-12-09 DIAGNOSIS — R600 Localized edema: Secondary | ICD-10-CM | POA: Diagnosis not present

## 2021-12-09 DIAGNOSIS — I2699 Other pulmonary embolism without acute cor pulmonale: Secondary | ICD-10-CM

## 2021-12-09 DIAGNOSIS — I5032 Chronic diastolic (congestive) heart failure: Secondary | ICD-10-CM

## 2021-12-09 DIAGNOSIS — I272 Pulmonary hypertension, unspecified: Secondary | ICD-10-CM

## 2021-12-09 DIAGNOSIS — I10 Essential (primary) hypertension: Secondary | ICD-10-CM

## 2021-12-09 MED ORDER — OLMESARTAN MEDOXOMIL 40 MG PO TABS: 40.0000 mg | ORAL_TABLET | Freq: Every day | ORAL | 0 refills | Status: DC

## 2021-12-09 MED ORDER — FLUTICASONE PROPIONATE 50 MCG/ACT NA SUSP: 2.0000 | Freq: Every day | NASAL | 3 refills | Status: DC

## 2021-12-09 MED ORDER — TRULICITY 0.75 MG/0.5ML ~~LOC~~ SOAJ: 0.7500 mg | SUBCUTANEOUS | 2 refills | Status: DC

## 2021-12-09 MED ORDER — LINACLOTIDE 72 MCG PO CAPS: 72.0000 ug | ORAL_CAPSULE | Freq: Every day | ORAL | 3 refills | Status: DC

## 2021-12-09 MED ORDER — METOPROLOL SUCCINATE ER 25 MG PO TB24: 125.0000 mg | ORAL_TABLET | Freq: Every day | ORAL | 3 refills | Status: DC

## 2021-12-09 MED ORDER — POTASSIUM CHLORIDE ER 20 MEQ PO TBCR: 20.0000 meq | EXTENDED_RELEASE_TABLET | Freq: Every day | ORAL | 0 refills | Status: DC

## 2021-12-09 MED ORDER — ATORVASTATIN CALCIUM 20 MG PO TABS: 10.0000 mg | ORAL_TABLET | Freq: Every evening | ORAL | 0 refills | Status: DC

## 2021-12-09 NOTE — Progress Notes (Signed)
Subjective:    Patient ID: [name], female    DOB: 1940/08/21, 81 y.o.   MRN: [id number]  Chief Complaint  Patient presents with   Follow-up    Patient is here to follow on the edema of lower legs. Patient reports the swelling worsen on R leg.   Patient accompanied by her daughter Voncille Lo.  HPI Patient is an 81 yo female with pmh sig for HTN, chronic diastolic CHF, DVT, PE, pulmonary hypertension, ILD, postinflammatory pulmonary fibrosis, RA, meningioma with prior gamma knife therapy, hypothyroidism, DM2 with neuropathy, HLD, gout, depression, chronic pain who was seen today for f/u.  Since last OFV patient dx'd with several PEs 10/30/21.  Taking Eliquis twice a day.  Notes some improvement in breathing.  Endorses continued LE edema/lymphedema, R>L.  Not currently wrapping legs.  States legs feel heavy.  Still in PT however inquires about stopping.  Patient occasionally elevating LEs.  Taking Lasix 80 mg daily.  Concern for urinary leakage if has legs wrapped.  Inquires about new diabetic shoes as old ones are too large.  In the past had special inserts made for shoes that laced up like an ankle brace.  Helped with ankle edema.  Pt also notes increased bruising over the left wrist x1 day.  Denies injury.  Also endorses myalgias of right upper arm, wrist, thighs x1 week.  Difficulty gripping things.  taking Lipitor 10 mg.  Patient requesting refills.  Past Medical History:  Diagnosis Date   Acute on chronic diastolic (congestive) heart failure (HCC)    Anemia    iron deficiency anemia - secondary to blood loss ( chronic)    Anxiety    Arthritis    endstage changes bilateral knees/bilateral ankles.    Asthma    Carotid artery occlusion    Chronic fatigue    Chronic kidney disease    Closed left hip fracture (HCC)    Clotting disorder ([place])    pt denies this   Contusion of left knee    due to fall 1/14.   COPD (chronic obstructive pulmonary disease) (HCC)    pulmonary fibrosis    Depression, reactive    Diabetes mellitus    type II    Diastolic dysfunction    Difficulty in walking    Family history of heart disease    Generalized muscle weakness    Gout    High cholesterol    History of falling    Hypertension    Hypothyroidism    Interstitial lung disease ([place])    Meningioma of left sphenoid wing involving cavernous sinus ([place]) 02/17/2012   Continue diplopia, left eye pain and left headaches.     Morbid obesity ([place])    Neuromuscular disorder ([place])    diabetic neuropathy    Normal coronary arteries    cardiac catheterization performed  10/31/14   Pneumonia    RA (rheumatoid arthritis) ([place])    has been off methotreaxte since 10/13.   Spinal stenosis of lumbar region    Thyroid disease    Unspecified lack of coordination    URI (upper respiratory infection)     Allergies  Allergen Reactions   Codeine Swelling and Other (See Comments)    Facial swelling Chest pain and swelling in legs     Infliximab Anaphylaxis    ([place]) "sent me into shock"    Lisinopril Swelling    Face and neck swelling     ROS General: Denies fever, chills, night sweats, changes in  weight, changes in appetite HEENT: Denies headaches, ear pain, changes in vision, rhinorrhea, sore throat CV: Denies CP, palpitations, SOB, orthopnea + LE edema Pulm: Denies SOB, cough, wheezing GI: Denies abdominal pain, nausea, vomiting, diarrhea, constipation GU: Denies dysuria, hematuria, frequency, vaginal discharge Msk: Denies muscle cramps, joint pains  + myalgias Neuro: Denies weakness, numbness, tingling Skin: Denies rashes, bruising Psych: Denies depression, anxiety, hallucinations     Objective:    Blood pressure (!) 154/98, pulse 95, temperature 98.1 F (36.7 C), temperature source Oral, weight 226 lb 6.4 oz (102.7 kg), SpO2 95 %.  Gen. Pleasant, well-nourished, in no distress, normal affect.  On O2, [place] in place HEENT: [place]/AT, face symmetric, conjunctiva clear, no  scleral icterus, PERRLA, EOMI, nares patent without drainage, pharynx without erythema or exudate. Lungs: no accessory muscle use, CTAB, no wheezes or rales Cardiovascular: RRR, no m/r/g, trace edema at shin b/l.  Lymphedema noted in bilateral LEs. Musculoskeletal: No deformities, no cyanosis or clubbing, normal tone Neuro:  A&Ox3, CN II-XII intact, normal gait Skin:  Warm, dry, and intact, ecchymosis on left wrist.  Lymphedema changes of bilateral LEs.   Wt Readings from Last 3 Encounters:  12/02/21 219 lb (99.3 kg)  11/15/21 219 lb (99.3 kg)  10/28/21 223 lb (101.2 kg)    Lab Results  Component Value Date   WBC 6.9 10/29/2021   HGB 12.2 10/29/2021   HCT 38.1 10/29/2021   PLT 144 (L) 10/29/2021   GLUCOSE 138 (H) 10/29/2021   CHOL 136 09/26/2020   TRIG 283.0 (H) 09/26/2020   HDL 40.00 09/26/2020   LDLDIRECT 59.0 09/26/2020   LDLCALC 75 09/22/2019   ALT 26 11/05/2021   AST 19 11/05/2021   NA 145 (H) 10/29/2021   K 3.6 10/29/2021   CL 100 10/29/2021   CREATININE 0.84 10/29/2021   BUN 18 10/29/2021   CO2 27 10/29/2021   TSH 1.33 09/25/2021   INR 1.0 03/30/2021   HGBA1C 6.5 (H) 04/02/2021    Assessment/Plan:  Essential hypertension  -Elevated possibly 2/2 pain -Recheck -Lifestyle modifications -Continue current medications -Controlled at recent cardiology visit - Plan: metoprolol succinate (TOPROL-XL) 25 MG 24 hr tablet, olmesartan (BENICAR) 40 MG tablet  Bilateral edema of lower extremity  -Euvolemic on exam. -Edema 2/2 lymphedema. -Bilateral legs wrapped in clinic with Ace bandage -Discussed referral to lymphedema clinic for further wrapping.  Patient initially reluctant but agrees. 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 - Plan: Ambulatory referral to Podiatry, Dulaglutide (TRULICITY) 7.82 NF/6.2ZH SOPN  Chronic constipation  - Plan: linaclotide (LINZESS) 72 MCG capsule  Chronic diastolic CHF (congestive heart failure) ([place])  -Stable -Continue current medications -Continue follow-up with cardiology - Plan: Potassium Chloride ER 20 MEQ TBCR  Mixed hyperlipidemia  -Given patient's recent myalgias we will have patient hold Lipitor 10 mg daily for the next week to see if there is any improvement.  If no improvement restart medication -Lifestyle modifications - Plan: atorvastatin (LIPITOR) 20 MG tablet  Ecchymosis -2/2 Eliquis use -Continue to monitor  Pulmonary embolism, unspecified chronicity, unspecified pulmonary embolism type, unspecified whether acute cor pulmonale present ([place]) --Noted on  imaging from 10/30/2021 -Continue Eliquis 5 mg twice daily -Continue follow-up with pulmonology and cardiology -Given strict precautions  Myalgia -Advise possibly related to Lipitor use.  We will have patient hold Lipitor 10 mg x 1 week to see if notes improvement in myalgias. -Discussed other possible causes including deconditioning, increased edema, electrolyte abnormalities.  F/u in the next few months, sooner if needed  Grier Mitts, MD

## 2021-12-11 ENCOUNTER — Encounter: Payer: Medicare Other | Admitting: Rehabilitative and Restorative Service Providers"

## 2021-12-13 ENCOUNTER — Encounter: Payer: Medicare Other | Admitting: Rehabilitative and Restorative Service Providers"

## 2021-12-17 ENCOUNTER — Ambulatory Visit (INDEPENDENT_AMBULATORY_CARE_PROVIDER_SITE_OTHER): Payer: Medicare Other | Admitting: Rehabilitative and Restorative Service Providers"

## 2021-12-17 ENCOUNTER — Encounter: Payer: Self-pay | Admitting: Rehabilitative and Restorative Service Providers"

## 2021-12-17 ENCOUNTER — Telehealth: Payer: Self-pay | Admitting: Family Medicine

## 2021-12-17 DIAGNOSIS — M6281 Muscle weakness (generalized): Secondary | ICD-10-CM

## 2021-12-17 DIAGNOSIS — M5459 Other low back pain: Secondary | ICD-10-CM | POA: Diagnosis not present

## 2021-12-17 DIAGNOSIS — M25552 Pain in left hip: Secondary | ICD-10-CM | POA: Diagnosis not present

## 2021-12-17 DIAGNOSIS — M1A00X0 Idiopathic chronic gout, unspecified site, without tophus (tophi): Secondary | ICD-10-CM | POA: Diagnosis not present

## 2021-12-17 DIAGNOSIS — R293 Abnormal posture: Secondary | ICD-10-CM | POA: Diagnosis not present

## 2021-12-17 DIAGNOSIS — I2694 Multiple subsegmental pulmonary emboli without acute cor pulmonale: Secondary | ICD-10-CM | POA: Diagnosis not present

## 2021-12-17 DIAGNOSIS — M5442 Lumbago with sciatica, left side: Secondary | ICD-10-CM | POA: Diagnosis not present

## 2021-12-17 DIAGNOSIS — J849 Interstitial pulmonary disease, unspecified: Secondary | ICD-10-CM | POA: Diagnosis not present

## 2021-12-17 DIAGNOSIS — Z7960 Long term (current) use of unspecified immunomodulators and immunosuppressants: Secondary | ICD-10-CM | POA: Diagnosis not present

## 2021-12-17 DIAGNOSIS — M0579 Rheumatoid arthritis with rheumatoid factor of multiple sites without organ or systems involvement: Secondary | ICD-10-CM | POA: Diagnosis not present

## 2021-12-17 DIAGNOSIS — G8929 Other chronic pain: Secondary | ICD-10-CM | POA: Diagnosis not present

## 2021-12-17 DIAGNOSIS — R262 Difficulty in walking, not elsewhere classified: Secondary | ICD-10-CM

## 2021-12-17 DIAGNOSIS — Z79899 Other long term (current) drug therapy: Secondary | ICD-10-CM | POA: Diagnosis not present

## 2021-12-17 NOTE — Therapy (Signed)
OUTPATIENT PHYSICAL THERAPY TREATMENT NOTE   Patient Name: MARIVEL MCCLARTY MRN: [id number] DOB:February 14, 1941, 81 y.o., female Today's Date: 12/17/2021  PCP: [name], MD REFERRING PROVIDER: No ref. provider found    END OF SESSION:   PT End of Session - 12/17/21 1429     Visit Number 14    Number of Visits 20    Date for PT Re-Evaluation 01/06/22    Authorization Type Medicare and [employer] - KX at 15    Progress Note Due on Visit 20    PT Start Time 1430    PT Stop Time 1510    PT Time Calculation (min) 40 min    Activity Tolerance Patient limited by fatigue;Patient limited by pain    Behavior During Therapy WFL for tasks assessed/performed              Past Medical History:  Diagnosis Date   Acute on chronic diastolic (congestive) heart failure (HCC)    Anemia    iron deficiency anemia - secondary to blood loss ( chronic)    Anxiety    Arthritis    endstage changes bilateral knees/bilateral ankles.    Asthma    Carotid artery occlusion    Chronic fatigue    Chronic kidney disease    Closed left hip fracture (HCC)    Clotting disorder ([place])    pt denies this   Contusion of left knee    due to fall 1/14.   COPD (chronic obstructive pulmonary disease) (HCC)    pulmonary fibrosis   Depression, reactive    Diabetes mellitus    type II    Diastolic dysfunction    Difficulty in walking    Family history of heart disease    Generalized muscle weakness    Gout    High cholesterol    History of falling    Hypertension    Hypothyroidism    Interstitial lung disease ([place])    Meningioma of left sphenoid wing involving cavernous sinus ([place]) 02/17/2012   Continue diplopia, left eye pain and left headaches.     Morbid obesity ([place])    Neuromuscular disorder ([place])    diabetic neuropathy    Normal coronary arteries    cardiac catheterization performed  10/31/14   Pneumonia    RA (rheumatoid arthritis) ([place])    has been off methotreaxte since 10/13.    Spinal stenosis of lumbar region    Thyroid disease    Unspecified lack of coordination    URI (upper respiratory infection)    Past Surgical History:  Procedure Laterality Date   ABDOMINAL HYSTERECTOMY     BRAIN SURGERY     Gamma knife 10/13. Needs repeat spring  '14   CARDIAC CATHETERIZATION N/A 10/31/2014   Procedure: Right/Left Heart Cath and Coronary Angiography;  Surgeon: [name], MD;  Location: [facility];  Service: Cardiovascular;  Laterality: N/A;   CYST EXCISION     2 on face   CYST REMOVAL NECK     ESOPHAGOGASTRODUODENOSCOPY (EGD) WITH PROPOFOL N/A 09/14/2014   Procedure: ESOPHAGOGASTRODUODENOSCOPY (EGD) WITH PROPOFOL;  Surgeon: [name], MD;  Location: [facility];  Service: Endoscopy;  Laterality: N/A;   INCISION AND DRAINAGE HIP Left 01/16/2017   Procedure: IRRIGATION AND DEBRIDEMENT LEFT HIP;  Surgeon: [name], MD;  Location: [facility];  Service: Orthopedics;  Laterality: Left;   INTRAMEDULLARY (IM) NAIL INTERTROCHANTERIC Left 11/29/2016   Procedure: INTRAMEDULLARY (IM) NAIL INTERTROCHANTRIC;  Surgeon: Ninfa Linden,  Lind Guest, MD;  Location: [facility];  Service: Orthopedics;  Laterality: Left;   OVARY SURGERY     RIGHT HEART CATH N/A 08/12/2021   Procedure: RIGHT HEART CATH;  Surgeon: Lorretta Harp, MD;  Location: [facility];  Service: Cardiovascular;  Laterality: N/A;   SHOULDER SURGERY Left    TONSILLECTOMY  age 19   VIDEO BRONCHOSCOPY Bilateral 05/31/2013   Procedure: VIDEO BRONCHOSCOPY WITHOUT FLUORO;  Surgeon: Brand Males, MD;  Location: [facility];  Service: Cardiopulmonary;  Laterality: Bilateral;   video bronscoscopy  05/19/1998   lung   Patient Active Problem List   Diagnosis Date Noted   Pulmonary embolism ([place]) 11/15/2021   Tachycardia 10/22/2021   Pulmonary HTN ([place])    Unspecified protein-calorie malnutrition ([place]) 04/05/2021   Pressure injury of skin 03/31/2021   UTI (urinary tract infection)  03/31/2021   Sepsis due to urinary tract infection ([place]) 03/30/2021   Acute encephalopathy 03/30/2021   Thrombocytopenia ([place]) 03/30/2021   Diabetic neuropathy ([place]) 05/24/2019   Hypokalemia 04/21/2019   Anticoagulated 06/25/2018   Chronic pain 06/25/2018   CRI (chronic renal insufficiency), stage 3 (moderate) (HCC) 06/25/2018   Drug-induced constipation 06/18/2018   DVT (deep venous thrombosis) ([place]) 06/03/2018   Primary osteoarthritis of both knees 05/26/2018   Body mass index (BMI) of 50-59.9 in adult ([place]) 05/26/2018   Acquired hypothyroidism 05/26/2018   Chronic bilateral low back pain without sciatica 02/17/2018   Astigmatism with presbyopia, bilateral 03/26/2017   Cortical age-related cataract of both eyes 03/26/2017   Family history of glaucoma 03/26/2017   Long term current use of oral hypoglycemic drug 03/26/2017   Nuclear sclerotic cataract of both eyes 03/26/2017   Pain in left hip 02/18/2017   Closed nondisplaced intertrochanteric fracture of left femur with delayed healing 01/14/2017   Gout 11/29/2016   Depression 11/29/2016   Closed left hip fracture ([place]) 11/29/2016   GERD (gastroesophageal reflux disease) 11/29/2016   Closed intertrochanteric fracture of hip, left, initial encounter ([place]) 11/29/2016   Physical deconditioning 07/31/2015   Pulmonary fibrosis, postinflammatory ([place]) 07/31/2015   Bronchiectasis without complication ([place]) 11/57/2620   Tendinitis of left rotator cuff 06/13/2015   Chronic diastolic CHF (congestive heart failure) ([place]) 04/21/2015   Acute kidney injury superimposed on chronic kidney disease ([place]) 04/17/2015   Chronic respiratory failure with hypoxia ([place]) 02/09/2015   Dyspnea and respiratory abnormality 01/03/2015   Normal coronary arteries 11/02/2014   Morbid obesity ([place]) 10/30/2014   Dysphagia 08/21/2014   Chronic fatigue 06/21/2014   DOE (dyspnea on exertion) 05/24/2014   Primary osteoarthritis of left knee 04/26/2014   High risk  medication use 04/17/2014   Aphasia 02/12/2014   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 ([place]) 01/18/2014   Lumbar facet arthropathy 01/18/2014   Bilateral edema of lower extremity 05/30/2013   Chronic cough 05/24/2013   ILD (interstitial lung disease) ([place]) 04/10/2013   Spinal stenosis of lumbar region with radiculopathy 07/12/2012   Inability to walk 07/12/2012   Meningioma of left sphenoid wing involving cavernous sinus ([place]) 02/17/2012   Chest pain of uncertain etiology 35/59/7416   Rheumatoid arthritis ([place]) 01/28/2012   Primary osteoarthritis of right knee 01/28/2012   Dyslipidemia    Thyroid disease    Essential hypertension     REFERRING PROVIDER: [name], MD   REFERRING DIAG: M54.50,G89.29 (ICD-10-CM) - Chronic bilateral low back pain without sciatica M25.552 (ICD-10-CM) - Pain in left hip  ONSET DATE:  Dec 2022  THERAPY DIAG:  Other  low back pain  Pain in left hip  Abnormal posture  Muscle weakness (generalized)  Difficulty in walking, not elsewhere classified  PERTINENT HISTORY: HTN, hypothyroidism, history of RA, COPD, DM, high cholesterol, CHF.  ORIF Lt femur from hip fracture "about 4-5 years ago"  PRECAUTIONS: May require supplemental oxygen  SUBJECTIVE:  Pt indicated having swelling in Rt leg that led to cancelling last week's appointment.  Pt also mentioned Rt shoulder hurting still (may have MRI per her report).  Pt indicated Lt hip felt better after last visit.   Pt indicated feeling some pain across the middle of the back today upon arrival.   PAIN:  Are you having pain? Yes:  NPRS scale: 4/10 at worst today Pain location: Back Pain description: achy tightness Aggravating factors: standing/walking prolonged, lying down on back Relieving factors: Medicine, patch   OBJECTIVE:    PATIENT SURVEYS:  11/05/2021: update 32 10/08/2021: update 37 % 09/12/2021: update 43% 08/16/2021 FOTO intake: 33 predicted:  37   SCREENING FOR RED  FLAGS: 08/16/2021 Bowel or bladder incontinence: No Cauda equina syndrome: No     COGNITION:            08/16/2021 Overall cognitive status: Within functional limits for tasks assessed                                 SENSATION: 08/16/2021 Light touch: WFL   MUSCLE LENGTH: 08/16/2021:  none tested today   POSTURE:  08/16/2021 : rounded shoulders, increased thoracic kyphosis, reduced lumbar lordosis in standing, forward trunk lean noted   PALPATION: 08/16/2021 : Tenderness across lower back bilateral, Lt > Rt, Lateral Lt hip tenderness to touch- trigger points noted 12/05/2021: trigger points and localized tenderness noted throughout bilateral quad (increased amount in lateral aspect of thigh), glute med/min bilateral, TFL, along IT band bilateral.  Also tender to touch on greater trochanter bilateral but reduced compared to myofascial palpation tenderness   LUMBAR ROM:    Active  AROM  08/16/2021 AROM 10/08/2021 AROM 11/05/2021  Flexion To mid thighs c pain    Extension To neutral, ERP lumbar noted.  X5 continued pain complaints at end range < 25 % but more than neutral c ERP noted Can get to neutral with knees straight  Right lateral flexion      Left lateral flexion      Right rotation      Left rotation       (Blank rows = not tested)   LE ROM:    PROM Right 09/02/2021 Left 09/02/2021 Right 10/16/2021 Left 10/16/2021  Hip flexion  95     Hip extension        Hip abduction        Hip adduction        Hip internal rotation  25 in 90 deg flexion 35 in 90 deg flexion  23 in 90 deg flexion seated 45 in 90 deg flexion seated  Hip external rotation  55 in 90 deg flexion 45 in 90 deg flexion  57 in 90 deg flexion seated  45 in 90 deg flexion seated (pain in Lt knee reported)  Knee flexion        Knee extension        Ankle dorsiflexion        Ankle plantarflexion        Ankle inversion        Ankle eversion         (  Blank rows = not tested)   LE MMT:   MMT Right 08/16/2021  Left 08/16/2021 Right 10/08/2021 Left 10/08/2021 Left 11/21/2021  Hip flexion 4/5 3+/5 5/5 4/5 5/5  Hip extension       Hip abduction       Hip adduction         Hip internal rotation         Hip external rotation         Knee flexion 5/5 5/5     Knee extension 5/5 4/5     Ankle dorsiflexion 5/5 5/5     Ankle plantarflexion         Ankle inversion         Ankle eversion          (Blank rows = not tested)   LUMBAR SPECIAL TESTS:  08/16/2021 (-) slump test bilateral   FUNCTIONAL TESTS:  12/17/2021:  TUG c rollator :  18.9 seconds  11/11/2021:  TUG c rollator 16.87  10/08/2021:  TUG : c rollator 17.02 seconds   18 inch chair transfer s UE on 1st try  08/16/2021 TUG: c rollator 27.83 seconds   GAIT: 12/05/2021:  Rollator c forearms on handholds, < 100 ft today limited due to pain in Rt shoulder/back.   11/21/2021: 82 ft c rollator - limited by Rt knee pain  10/08/2021: 185 ft c rollator  09/10/2021:  Rollator 96 ft (stopped due to out of breath)  08/16/2021 Distance walked: Rollator household distances < 150 ft, modified independent                         Has wheelchair for community.        TODAY'S TREATMENT  12/17/2021  Therex:   Nustep UE/LE lvl 5 10 mins   Seated small pball press down UE 10 sec hold x 6   Seated small pball hip add squeeze 5 sec hold x 15   Seated scapular retraction 5 second hold x 10   Seated bilateral UE rows c scapular retraction x 20 c green band   Seated green band clam shell 20x bilateral       Sit to stand from 18 inch chair x 3, TUG performed   Manual   STM c percussive device assist to Lt  glute med/min, quad c focus on anterior/lateral aspect of proximal and middle 1/3 for myofascial release techniques.  Same performed to bilateral lumbar paraspinals and QL.   12/05/2021  Therex:   Nustep UE/LE lvl 5 8 min (SpO2 range 88-97% with cues for deep breathing and 1 min rest during ex)   Seated scapular retraction 5 second hold x 10   Seated  shoulder ER isometric hold with 0-90* flexion green band 2 x 5   Sit to stand from 18 inch chair x 5   Manual   STM c percussive device assist to bilateral glute med/min, quad c focus on anterior/lateral aspect of proximal and middle 1/3 for myofascial release techniques.  Education given on application of manual, background of how and why it may help symptoms, and options for STM at home and continued assessment of response during, pt verbalized understanding and returned demo of self-STM with tennis ball  11/21/2021 Therex:           Nustep UE/LE Lvl 5 10 mins    Double leg press 50 lbs x 20  Seated pelvic tilt anterior/posterior x 15  Seated scapular retraction 5  second hold x 5  Seated LAQ c pause in end range x 15 bilateral  Seated combo clam shell, bilateral UE ER 2 x 10 green band  Seated green band PF c bicep curl bilateral x 10 bilateral   Sit to stand from 18 inch chair x 5    PATIENT EDUCATION:  10/16/2021 Education details: HEP update Person educated: Patient Education method: Consulting civil engineer, Demonstration, Verbal cues, and Handouts Education comprehension: verbalized understanding and returned demonstration     HOME EXERCISE PROGRAM: Access Code: [email or web address] URL: [email or web address] Date: 10/16/2021 Prepared by: Scot Jun  Exercises - Supine Lower Trunk Rotation  - 2-3 x daily - 7 x weekly - 1 sets - 2 reps - 15 hold - Hooklying Single Knee to Chest Stretch  - 2-3 x daily - 7 x weekly - 1 sets - 2 reps - 15 hold - Seated Scapular Retraction  - 2-3 x daily - 7 x weekly - 1 sets - 5-10 reps - 3-5 hold - Seated Hip Abduction with Resistance  - 1-2 x daily - 7 x weekly - 1-2 sets - 10-15 reps - Seated [place] with Same Side Arm Raise   - 1-2 x daily - 7 x weekly - 1-2 sets - 10 reps   ASSESSMENT:   CLINICAL IMPRESSION: Continued presentation of multiple pains in various locations that can impact functional movement tolerance.  Positive improvements in  lateral hip complaints following soft tissue mobilization techniques last visit.  Repeated today and performed for lumbar muscles as well.  Continued general strengthening and endurance improvements to help improve postural tolerance in daily activity.     OBJECTIVE IMPAIRMENTS Abnormal gait, decreased activity tolerance, decreased balance, decreased coordination, decreased endurance, decreased mobility, difficulty walking, decreased ROM, decreased strength, hypomobility, impaired perceived functional ability, increased muscle spasms, impaired flexibility, improper body mechanics, postural dysfunction, obesity, and pain.    ACTIVITY LIMITATIONS cleaning, community activity, driving, meal prep, and laundry.    PERSONAL FACTORS 3+ comorbidities: HTN, hypothyroidism, history of RA, COPD, DM, high cholesterol, CHF  are also affecting patient's functional outcome.      REHAB POTENTIAL: Fair to good   CLINICAL DECISION MAKING: Evolving/moderate complexity   EVALUATION COMPLEXITY: Moderate     GOALS: Goals reviewed with patient? Yes   Short term PT Goals (target date for Short term goals are 3 weeks 09/06/2021) Patient will demonstrate independent use of home exercise program to maintain progress from in clinic treatments. Goal status: MET - assessed 09/10/2021   Long term PT goals (target dates for all long term goals are 10 weeks  11/22/2021 )   1. Patient will demonstrate/report pain at worst less than or equal to 2/10 to facilitate minimal limitation in daily activity secondary to pain symptoms. Goal status: On Going - assessed 12/05/2021  2. Patient will demonstrate independent use of home exercise program to facilitate ability to maintain/progress functional gains from skilled physical therapy services. Goal status: Met- assessed 11/05/2021   3. Patient will demonstrate FOTO outcome > or = 37 % to indicate reduced disability due to condition. Goal status: On Going- assessed 12/05/2021   4.   Patient will demonstrate TUG c rollator < 14 seconds to indicate reduced fall risk and improved community integration level.  Goal status: On Going  assessed 12/05/2021   5.  Patient will demonstrate lumbar ext AROM > or = 50% to facilitate upright standing/walking posture for household and community intergration.    Goal status: On Going - assessed 12/05/2021  6.  Patient will demonstrate bilateral knee MMT 5/5 throughout, Lt hip MMT 4/5 or greater throughout to facilitate daily movement at PLOF.   Goal status: On Going - assessed 12/05/2021   7.  Patient will demonstrate ability to walk > 500 ft s resting to facilitate ability to ambulation in community.  a.  Goal Status: On Going - assessed 12/05/2021     PLAN: PT FREQUENCY: 2x/week   PT DURATION: 4 weeks   PLANNED INTERVENTIONS: Therapeutic exercises, Therapeutic activity, Neuro Muscular re-education, Balance training, Gait training, Patient/Family education, Joint mobilization, Stair training, DME instructions, Dry Needling, Electrical stimulation, Cryotherapy, Moist heat, Taping, Ultrasound, Ionotophoresis 58m/ml Dexamethasone, and Manual therapy.  All included unless contraindicated.   PLAN FOR NEXT SESSION: continue progressive LE/core strengthening and standing/walking endurance, manual or stretching for sx relief    MScot Jun PT, DPT, OCS, ATC 12/17/21  3:07 PM

## 2021-12-17 NOTE — Telephone Encounter (Signed)
Pt's daughter called wanting to ask CMA a question.    I asked if the call was for Rx refills, as I could help her with that, and she said no, something else.  CMA was unavailable.   Please call her back at  [phone] - Fonnie Jarvis

## 2021-12-18 ENCOUNTER — Telehealth: Payer: Self-pay | Admitting: Family Medicine

## 2021-12-18 NOTE — Telephone Encounter (Signed)
error 

## 2021-12-19 ENCOUNTER — Ambulatory Visit: Payer: Medicare Other | Admitting: Family Medicine

## 2021-12-19 NOTE — Telephone Encounter (Signed)
Spoke with daughter. She states patient has appt with Dr. Volanda Napoleon in regards to following up from Rheumatologist([facility]).

## 2021-12-20 ENCOUNTER — Ambulatory Visit (INDEPENDENT_AMBULATORY_CARE_PROVIDER_SITE_OTHER): Payer: Medicare Other

## 2021-12-20 ENCOUNTER — Ambulatory Visit (INDEPENDENT_AMBULATORY_CARE_PROVIDER_SITE_OTHER): Payer: Medicare Other | Admitting: Family Medicine

## 2021-12-20 ENCOUNTER — Encounter: Payer: Self-pay | Admitting: Family Medicine

## 2021-12-20 VITALS — BP 142/92 | HR 93 | Temp 98.4°F | Wt 226.4 lb

## 2021-12-20 DIAGNOSIS — M05742 Rheumatoid arthritis with rheumatoid factor of left hand without organ or systems involvement: Secondary | ICD-10-CM | POA: Diagnosis not present

## 2021-12-20 DIAGNOSIS — R5381 Other malaise: Secondary | ICD-10-CM

## 2021-12-20 DIAGNOSIS — J849 Interstitial pulmonary disease, unspecified: Secondary | ICD-10-CM | POA: Diagnosis not present

## 2021-12-20 DIAGNOSIS — M05741 Rheumatoid arthritis with rheumatoid factor of right hand without organ or systems involvement: Secondary | ICD-10-CM

## 2021-12-20 DIAGNOSIS — R600 Localized edema: Secondary | ICD-10-CM | POA: Diagnosis not present

## 2021-12-20 DIAGNOSIS — M79621 Pain in right upper arm: Secondary | ICD-10-CM

## 2021-12-20 DIAGNOSIS — I5032 Chronic diastolic (congestive) heart failure: Secondary | ICD-10-CM

## 2021-12-20 DIAGNOSIS — I824Y2 Acute embolism and thrombosis of unspecified deep veins of left proximal lower extremity: Secondary | ICD-10-CM

## 2021-12-20 DIAGNOSIS — I77810 Thoracic aortic ectasia: Secondary | ICD-10-CM | POA: Diagnosis not present

## 2021-12-20 DIAGNOSIS — Z7901 Long term (current) use of anticoagulants: Secondary | ICD-10-CM | POA: Diagnosis not present

## 2021-12-20 DIAGNOSIS — I10 Essential (primary) hypertension: Secondary | ICD-10-CM

## 2021-12-20 DIAGNOSIS — I2699 Other pulmonary embolism without acute cor pulmonale: Secondary | ICD-10-CM

## 2021-12-20 DIAGNOSIS — Z8709 Personal history of other diseases of the respiratory system: Secondary | ICD-10-CM | POA: Diagnosis not present

## 2021-12-20 DIAGNOSIS — G8929 Other chronic pain: Secondary | ICD-10-CM

## 2021-12-20 MED ORDER — PREDNISONE 20 MG PO TABS: 40.0000 mg | ORAL_TABLET | Freq: Every day | ORAL | 0 refills | Status: AC

## 2021-12-20 MED ORDER — AMOXICILLIN-POT CLAVULANATE 875-125 MG PO TABS: 1.0000 | ORAL_TABLET | Freq: Two times a day (BID) | ORAL | 0 refills | Status: AC

## 2021-12-20 NOTE — Progress Notes (Signed)
Subjective:    Patient ID: [name], female    DOB: 18-Feb-1941, 81 y.o.   MRN: [id number]  Chief Complaint  Patient presents with   Edema    On upper Right arm. Painful to touch   Follow-up    From the rheumatologist. Needing an [place] and MRI on R arm.     HPI Patient is an 81 year old female with pmh sig for chronic diastolic CHF, recent PEs, history of DVT, chronic anticoagulation, HTN, ILD, pulmonary fibrosis, depression, acquired hypothyroidism, DM2, diabetic neuropathy, meningioma of left sphenoid wing with prior gamma knife therapy, lumbar spinal stenosis with radiculopathy, RA, OA, history of left hip fracture, chronic renal insufficiency, obesity, chronic pain, LE edema, gout who was seen today for f/u on arm pain.   Pt with continued R upper arm pain and edema despite holding statin.  Pt states arm is sore to the touch, hurts with lifting arm/has a pulling sensation.  Pt notes prior to dx of multiple PEs on 10/30/21, pt had a giant bruise on R upper arm.  Previously on ASA 325, but now taking Eliquis 5 mg BID.  Pt denies injury, heavy lifting, pushing, pulling, falls.  Had appt with Rheum, Wenda Overland, NP for RA, pt to continue prednisone 10 mg daily.  Pt states she is having increased jt pain in hands.  Previously on a med for RA, but developed DVT.    Patient states increased general pain causing BP to remain elevated.  Pt mentions feeling like she is starting to get sick.  States sensation started a few days ago.  Subjective fever as pt was hot while sleeping the last 2 nights.  This is unusual as patient is typically cold.  Patient denies cough, nausea, vomiting, sore throat, rhinorrhea.  Currently on 3 L O2 via [place].  Followed by [facility] pulmonology and [facility] pulmonology.  Past Medical History:  Diagnosis Date   Acute on chronic diastolic (congestive) heart failure (HCC)    Anemia    iron deficiency anemia - secondary to blood loss ( chronic)    Anxiety    Arthritis     endstage changes bilateral knees/bilateral ankles.    Asthma    Carotid artery occlusion    Chronic fatigue    Chronic kidney disease    Closed left hip fracture (HCC)    Clotting disorder ([place])    pt denies this   Contusion of left knee    due to fall 1/14.   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     Morbid obesity ([place])    Neuromuscular disorder ([place])    diabetic neuropathy    Normal coronary arteries    cardiac catheterization performed  10/31/14   Pneumonia    RA (rheumatoid arthritis) ([place])    has been off methotreaxte since 10/13.   Spinal stenosis of lumbar region    Thyroid disease    Unspecified lack of coordination  URI (upper respiratory infection)     Allergies  Allergen Reactions   Codeine Swelling and Other (See Comments)    Facial swelling Chest pain and swelling in legs     Infliximab Anaphylaxis    ([place]) "sent me into shock"    Lisinopril Swelling    Face and neck swelling     ROS General: Denies chills, night sweats, changes in weight, changes in appetite  + subjective fever, malaise HEENT: Denies headaches, ear pain, changes in vision, rhinorrhea, sore throat CV: Denies CP, palpitations, SOB, orthopnea Pulm: Denies SOB, cough, wheezing GI: Denies abdominal pain, nausea, vomiting, diarrhea, constipation GU: Denies dysuria, hematuria, frequency, vaginal discharge Msk: Denies muscle cramps, joint pains  +pain, edema, pulling sensation in RUE, low back pain Neuro: Denies weakness,  numbness, tingling Skin: Denies rashes, bruising Psych: Denies depression, anxiety, hallucinations     Objective:    Blood pressure (!) 150/90, pulse 93, temperature 98.4 F (36.9 C), temperature source Oral, weight 226 lb 6.4 oz (102.7 kg), SpO2 95 %.  Gen. Pleasant, well-nourished, in no distress, slightly depressed affect, on 3 L O2 via [place] HEENT: [place]/AT, face symmetric, conjunctiva clear, no scleral icterus, PERRLA, EOMI, nares patent without drainage Lungs: no accessory muscle use, faint rales in b/l bases R>L.  On 3L O2 via [place] Cardiovascular: RRR, no m/r/g. Musculoskeletal: Diffuse TTP of R upper arm with firm mobile mass noted medial to area of resolving ecchymosis.  TTP of R shoulder, scapula, and upper back.  Decreased active and passive ROM in RUE 2/2 pain.  No deformities, no cyanosis or clubbing, normal tone Neuro:  A&Ox3, CN II-XII intact, sitting in transport wheelchair gait not assessed Skin:  Warm, dry, intact.  R upper arm with faint area of ecchymosis over head of biceps brachii.   Wt Readings from Last 3 Encounters:  12/20/21 226 lb 6.4 oz (102.7 kg)  12/09/21 226 lb 6.4 oz (102.7 kg)  12/02/21 219 lb (99.3 kg)    Lab Results  Component Value Date   WBC 6.9 10/29/2021   HGB 12.2 10/29/2021   HCT 38.1 10/29/2021   PLT 144 (L) 10/29/2021   GLUCOSE 138 (H) 10/29/2021   CHOL 136 09/26/2020   TRIG 283.0 (H) 09/26/2020   HDL 40.00 09/26/2020   LDLDIRECT 59.0 09/26/2020   LDLCALC 75 09/22/2019   ALT 26 11/05/2021   AST 19 11/05/2021   NA 145 (H) 10/29/2021   K 3.6 10/29/2021   CL 100 10/29/2021   CREATININE 0.84 10/29/2021   BUN 18 10/29/2021   CO2 27 10/29/2021   TSH 1.33 09/25/2021   INR 1.0 03/30/2021   HGBA1C 6.5 (H) 04/02/2021    Assessment/Plan:  Edema of right upper arm  - Plan: VAS [place] UPPER EXTREMITY VENOUS DUPLEX  Pain in right upper arm -No change in symptoms with holding statin -Discussed possible causes including venous thrombosis, muscle  strain though less likely as patient denies increased activity -Advised currently on appropriate therapy if venous thrombosis noted. -Continue supportive care - Plan: VAS [place] UPPER EXTREMITY VENOUS DUPLEX  ILD (interstitial lung disease) ([place]) -Continue 3 L O2 via [place], wean as tolerated -Continue current medications -Continue follow-up with pulmonology at [facility] and [place]: DG Chest 2 View  [place] - Plan: DG Chest 2 View  Acute deep vein thrombosis (DVT) of proximal vein of left lower extremity (HCC) -Eliquis 5 mg twice daily -Prior history of DVT in 2020 while on Xeljanz.  Pulmonary embolism, unspecified chronicity, unspecified  pulmonary embolism type, without acute cor pulmonale present (HCC) -Continue Eliquis 5 mg twice daily -Continue follow-up with cards and pulmonology  Other chronic pain -Continue chronic pain meds including MS Contin 15 mg twice daily and immediate release morphine 15 mg twice daily for breakthrough pain -Continue follow-up with pain management  Essential hypertension -Elevated likely 2/2 increased pain and recent PEs. -Discussed ways to manage chronic pain -Continue current medications including olmesartan 40 mg daily, Toprol XL 125 mg daily, Lasix 80 mg daily  Chronic diastolic CHF (HCC) -Euvolemic Continue current medications olmesartan 40 mg daily, Toprol-XL 125 mg daily, Lasix 80 mg daily, Zaroxolyn 2.5 mg as needed for weight above 255 pounds -Continue supportive care including monitoring sodium intake, elevating LEs, etc. -Continue follow-up cardiology  Rheumatoid arthritis involving both hands with positive rheumatoid factor (HCC) -Continue hydroxychloroquine 200 mg daily, leflunomide 20 mg daily, prednisone 10 mg daily. -Previously on [place] which was d/c'd after developing DVT in 2020 -Continue follow-up with rheumatology  Chronic anticoagulation -Continue Eliquis 5 mg twice daily  Update: CXR 12/20/2021 with slight prominence of  interstitial markings in right perihilar region and right lower lung field suggesting scarring or interstitial pneumonia.  Comparison: Chest radiograph 03/30/2021, CT done 10/30/2021.  Patient contacted with results.  Will start Augmentin twice daily x7 days and prednisone burst.  Given strict precautions  Acute interstitial pneumonia ([place]) - Plan: amoxicillin-clavulanate (AUGMENTIN) 875-125 MG tablet, predniSONE (DELTASONE) 20 MG tablet  F/u as needed  Grier Mitts, MD

## 2021-12-23 ENCOUNTER — Ambulatory Visit (HOSPITAL_COMMUNITY)
Admission: RE | Admit: 2021-12-23 | Discharge: 2021-12-23 | Disposition: A | Discharge status: Home / Self Care | Payer: Medicare Other | Source: Ambulatory Visit | Attending: Family Medicine | Admitting: Family Medicine

## 2021-12-23 ENCOUNTER — Encounter: Payer: Self-pay | Admitting: Rehabilitative and Restorative Service Providers"

## 2021-12-23 ENCOUNTER — Ambulatory Visit (INDEPENDENT_AMBULATORY_CARE_PROVIDER_SITE_OTHER): Payer: Medicare Other | Admitting: Rehabilitative and Restorative Service Providers"

## 2021-12-23 DIAGNOSIS — R293 Abnormal posture: Secondary | ICD-10-CM

## 2021-12-23 DIAGNOSIS — M79621 Pain in right upper arm: Secondary | ICD-10-CM | POA: Diagnosis not present

## 2021-12-23 DIAGNOSIS — M6281 Muscle weakness (generalized): Secondary | ICD-10-CM

## 2021-12-23 DIAGNOSIS — R600 Localized edema: Secondary | ICD-10-CM | POA: Diagnosis not present

## 2021-12-23 DIAGNOSIS — R262 Difficulty in walking, not elsewhere classified: Secondary | ICD-10-CM | POA: Diagnosis not present

## 2021-12-23 DIAGNOSIS — M5459 Other low back pain: Secondary | ICD-10-CM | POA: Diagnosis not present

## 2021-12-23 DIAGNOSIS — M25552 Pain in left hip: Secondary | ICD-10-CM

## 2021-12-23 NOTE — Progress Notes (Signed)
Right UE venous duplex study completed. Please see CV Proc for preliminary results.  Demitria Hay BS, RVT 12/23/2021 10:23 AM

## 2021-12-23 NOTE — Therapy (Signed)
OUTPATIENT PHYSICAL THERAPY TREATMENT NOTE   Patient Name: [name] MRN: [id number] DOB:[birth year], 81 y.o., female Today's Date: 12/23/2021  PCP: [name], MD REFERRING PROVIDER: Martyn Ehrich, NP    END OF SESSION:   PT End of Session - 12/23/21 1402     Visit Number 15    Number of Visits 20    Date for PT Re-Evaluation 01/06/22    Authorization Type Medicare and [place] at 15    Progress Note Due on Visit 20    PT Start Time 3662    PT Stop Time 1430    PT Time Calculation (min) 34 min    Activity Tolerance Patient limited by fatigue;Patient limited by pain    Behavior During Therapy WFL for tasks assessed/performed               Past Medical History:  Diagnosis Date   Acute on chronic diastolic (congestive) heart failure (HCC)    Anemia    iron deficiency anemia - secondary to blood loss ( chronic)    Anxiety    Arthritis    endstage changes bilateral knees/bilateral ankles.    Asthma    Carotid artery occlusion    Chronic fatigue    Chronic kidney disease    Closed left hip fracture (HCC)    Clotting disorder ([place])    pt denies this   Contusion of left knee    due to fall 1/14.   COPD (chronic obstructive pulmonary disease) (HCC)    pulmonary fibrosis   Depression, reactive    Diabetes mellitus    type II    Diastolic dysfunction    Difficulty in walking    Family history of heart disease    Generalized muscle weakness    Gout    High cholesterol    History of falling    Hypertension    Hypothyroidism    Interstitial lung disease ([place])    Meningioma of left sphenoid wing involving cavernous sinus ([place]) 02/17/2012   Continue diplopia, left eye pain and left headaches.     Morbid obesity ([place])    Neuromuscular disorder ([place])    diabetic neuropathy    Normal coronary arteries    cardiac catheterization performed  10/31/14   Pneumonia    RA (rheumatoid arthritis) ([place])    has been off methotreaxte since 10/13.    Spinal stenosis of lumbar region    Thyroid disease    Unspecified lack of coordination    URI (upper respiratory infection)    Past Surgical History:  Procedure Laterality Date   ABDOMINAL HYSTERECTOMY     BRAIN SURGERY     Gamma knife 10/13. Needs repeat spring  '14   CARDIAC CATHETERIZATION N/A 10/31/2014   Procedure: Right/Left Heart Cath and Coronary Angiography;  Surgeon: [name], MD;  Location: [facility];  Service: Cardiovascular;  Laterality: N/A;   CYST EXCISION     2 on face   CYST REMOVAL NECK     ESOPHAGOGASTRODUODENOSCOPY (EGD) WITH PROPOFOL N/A 09/14/2014   Procedure: ESOPHAGOGASTRODUODENOSCOPY (EGD) WITH PROPOFOL;  Surgeon: [name], MD;  Location: [facility];  Service: Endoscopy;  Laterality: N/A;   INCISION AND DRAINAGE HIP Left 01/16/2017   Procedure: IRRIGATION AND DEBRIDEMENT LEFT HIP;  Surgeon: [name], MD;  Location: [facility];  Service: Orthopedics;  Laterality: Left;   INTRAMEDULLARY (IM) NAIL INTERTROCHANTERIC Left 11/29/2016   Procedure: INTRAMEDULLARY (IM) NAIL INTERTROCHANTRIC;  Surgeon:  [name], MD;  Location: [facility];  Service: Orthopedics;  Laterality: Left;   OVARY SURGERY     RIGHT HEART CATH N/A 08/12/2021   Procedure: RIGHT HEART CATH;  Surgeon: Lorretta Harp, MD;  Location: [facility];  Service: Cardiovascular;  Laterality: N/A;   SHOULDER SURGERY Left    TONSILLECTOMY  age 30   VIDEO BRONCHOSCOPY Bilateral 05/31/2013   Procedure: VIDEO BRONCHOSCOPY WITHOUT FLUORO;  Surgeon: Brand Males, MD;  Location: [facility];  Service: Cardiopulmonary;  Laterality: Bilateral;   video bronscoscopy  05/19/1998   lung   Patient Active Problem List   Diagnosis Date Noted   Pulmonary embolism ([place]) 11/15/2021   Tachycardia 10/22/2021   Pulmonary HTN ([place])    Unspecified protein-calorie malnutrition ([place]) 04/05/2021   Pressure injury of skin 03/31/2021   UTI (urinary tract infection)  03/31/2021   Sepsis due to urinary tract infection ([place]) 03/30/2021   Acute encephalopathy 03/30/2021   Thrombocytopenia ([place]) 03/30/2021   Diabetic neuropathy ([place]) 05/24/2019   Hypokalemia 04/21/2019   Anticoagulated 06/25/2018   Chronic pain 06/25/2018   CRI (chronic renal insufficiency), stage 3 (moderate) (HCC) 06/25/2018   Drug-induced constipation 06/18/2018   DVT (deep venous thrombosis) ([place]) 06/03/2018   Primary osteoarthritis of both knees 05/26/2018   Body mass index (BMI) of 50-59.9 in adult ([place]) 05/26/2018   Acquired hypothyroidism 05/26/2018   Chronic bilateral low back pain without sciatica 02/17/2018   Astigmatism with presbyopia, bilateral 03/26/2017   Cortical age-related cataract of both eyes 03/26/2017   Family history of glaucoma 03/26/2017   Long term current use of oral hypoglycemic drug 03/26/2017   Nuclear sclerotic cataract of both eyes 03/26/2017   Pain in left hip 02/18/2017   Closed nondisplaced intertrochanteric fracture of left femur with delayed healing 01/14/2017   Gout 11/29/2016   Depression 11/29/2016   Closed left hip fracture ([place]) 11/29/2016   GERD (gastroesophageal reflux disease) 11/29/2016   Closed intertrochanteric fracture of hip, left, initial encounter ([place]) 11/29/2016   Physical deconditioning 07/31/2015   Pulmonary fibrosis, postinflammatory ([place]) 07/31/2015   Bronchiectasis without complication ([place]) 25/63/8937   Tendinitis of left rotator cuff 06/13/2015   Chronic diastolic CHF (congestive heart failure) ([place]) 04/21/2015   Acute kidney injury superimposed on chronic kidney disease ([place]) 04/17/2015   Chronic respiratory failure with hypoxia ([place]) 02/09/2015   Dyspnea and respiratory abnormality 01/03/2015   Normal coronary arteries 11/02/2014   Morbid obesity ([place]) 10/30/2014   Dysphagia 08/21/2014   Chronic fatigue 06/21/2014   DOE (dyspnea on exertion) 05/24/2014   Primary osteoarthritis of left knee 04/26/2014   High risk  medication use 04/17/2014   Aphasia 02/12/2014   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 ([place]) 01/18/2014   Lumbar facet arthropathy 01/18/2014   Bilateral edema of lower extremity 05/30/2013   Chronic cough 05/24/2013   ILD (interstitial lung disease) ([place]) 04/10/2013   Spinal stenosis of lumbar region with radiculopathy 07/12/2012   Inability to walk 07/12/2012   Meningioma of left sphenoid wing involving cavernous sinus ([place]) 02/17/2012   Chest pain of uncertain etiology 34/28/7681   Rheumatoid arthritis ([place]) 01/28/2012   Primary osteoarthritis of right knee 01/28/2012   Dyslipidemia    Thyroid disease    Essential hypertension     REFERRING PROVIDER: [name], MD   REFERRING DIAG: M54.50,G89.29 (ICD-10-CM) - Chronic bilateral low back pain without sciatica M25.552 (ICD-10-CM) - Pain in left hip  ONSET DATE:  Dec 2022  THERAPY DIAG:  Other low back pain  Pain in left hip  Abnormal posture  Muscle weakness (generalized)  Difficulty in walking, not elsewhere classified  PERTINENT HISTORY: HTN, hypothyroidism, history of RA, COPD, DM, high cholesterol, CHF.  ORIF Lt femur from hip fracture "about 4-5 years ago"  PRECAUTIONS: May require supplemental oxygen  SUBJECTIVE:  Pt reported symptoms in back/hips around 3/10 today.  Feeling like it was doing better with use device on hip muscles.  Forgot walker today.   PAIN:  Are you having pain? Yes:  NPRS scale: 4/10 at worst today Pain location: Back Pain description: achy tightness Aggravating factors: standing/walking prolonged, lying down on back Relieving factors: Medicine, patch   OBJECTIVE:    PATIENT SURVEYS:   12/23/2021 : update 29% 11/05/2021: update 32 10/08/2021: update 37 % 09/12/2021: update 43% 08/16/2021 FOTO intake: 33 predicted:  37   SCREENING FOR RED FLAGS: 08/16/2021 Bowel or bladder incontinence: No Cauda equina syndrome: No     COGNITION:            08/16/2021 Overall  cognitive status: Within functional limits for tasks assessed                                 SENSATION: 08/16/2021 Light touch: WFL   MUSCLE LENGTH: 08/16/2021:  none tested today   POSTURE:  08/16/2021 : rounded shoulders, increased thoracic kyphosis, reduced lumbar lordosis in standing, forward trunk lean noted   PALPATION: 08/16/2021 : Tenderness across lower back bilateral, Lt > Rt, Lateral Lt hip tenderness to touch- trigger points noted 12/05/2021: trigger points and localized tenderness noted throughout bilateral quad (increased amount in lateral aspect of thigh), glute med/min bilateral, TFL, along IT band bilateral.  Also tender to touch on greater trochanter bilateral but reduced compared to myofascial palpation tenderness   LUMBAR ROM:    Active  AROM  08/16/2021 AROM 10/08/2021 AROM 11/05/2021  Flexion To mid thighs c pain    Extension To neutral, ERP lumbar noted.  X5 continued pain complaints at end range < 25 % but more than neutral c ERP noted Can get to neutral with knees straight  Right lateral flexion      Left lateral flexion      Right rotation      Left rotation       (Blank rows = not tested)   LE ROM:    PROM Right 09/02/2021 Left 09/02/2021 Right 10/16/2021 Left 10/16/2021  Hip flexion  95     Hip extension        Hip abduction        Hip adduction        Hip internal rotation  25 in 90 deg flexion 35 in 90 deg flexion  23 in 90 deg flexion seated 45 in 90 deg flexion seated  Hip external rotation  55 in 90 deg flexion 45 in 90 deg flexion  57 in 90 deg flexion seated  45 in 90 deg flexion seated (pain in Lt knee reported)  Knee flexion        Knee extension        Ankle dorsiflexion        Ankle plantarflexion        Ankle inversion        Ankle eversion         (Blank rows = not tested)   LE MMT:   MMT Right 08/16/2021 Left 08/16/2021 Right 10/08/2021  Left 10/08/2021 Left 11/21/2021  Hip flexion 4/5 3+/5 5/5 4/5 5/5  Hip extension       Hip  abduction       Hip adduction         Hip internal rotation         Hip external rotation         Knee flexion 5/5 5/5     Knee extension 5/5 4/5     Ankle dorsiflexion 5/5 5/5     Ankle plantarflexion         Ankle inversion         Ankle eversion          (Blank rows = not tested)   LUMBAR SPECIAL TESTS:  08/16/2021 (-) slump test bilateral   FUNCTIONAL TESTS:  12/17/2021:  TUG c rollator :  18.9 seconds  11/11/2021:  TUG c rollator 16.87  10/08/2021:  TUG : c rollator 17.02 seconds   18 inch chair transfer s UE on 1st try  08/16/2021 TUG: c rollator 27.83 seconds   GAIT: 12/05/2021:  Rollator c forearms on handholds, < 100 ft today limited due to pain in Rt shoulder/back.   11/21/2021: 82 ft c rollator - limited by Rt knee pain  10/08/2021: 185 ft c rollator  09/10/2021:  Rollator 96 ft (stopped due to out of breath)  08/16/2021 Distance walked: Rollator household distances < 150 ft, modified independent                         Has wheelchair for community.        TODAY'S TREATMENT  12/23/2021  Therex:   Nustep UE/LE lvl 5 16 mins   Leg press: double leg 50 lbs 2 x 10    Standing in walker bilateral UE assist hip extension x 10 bilateral   Seated scapular retraction 5 sec hold x 10 (unsupported)      Sit to stand from 18 inch chair x 5   Manual   STM c percussive device assist to Lt  glute med/min, quad c focus on anterior/lateral aspect of proximal and middle 1/3 for myofascial release techniques.    12/17/2021  Therex:   Nustep UE/LE lvl 5 10 mins   Seated small pball press down UE 10 sec hold x 6   Seated small pball hip add squeeze 5 sec hold x 15   Seated scapular retraction 5 second hold x 10   Seated bilateral UE rows c scapular retraction x 20 c green band   Seated green band clam shell 20x bilateral       Sit to stand from 18 inch chair x 3, TUG performed   Manual   STM c percussive device assist to Lt  glute med/min, quad c focus on anterior/lateral  aspect of proximal and middle 1/3 for myofascial release techniques.  Same performed to bilateral lumbar paraspinals and QL.   12/05/2021  Therex:   Nustep UE/LE lvl 5 8 min (SpO2 range 88-97% with cues for deep breathing and 1 min rest during ex)   Seated scapular retraction 5 second hold x 10   Seated shoulder ER isometric hold with 0-90* flexion green band 2 x 5   Sit to stand from 18 inch chair x 5   Manual   STM c percussive device assist to bilateral glute med/min, quad c focus on anterior/lateral aspect of proximal and middle 1/3 for myofascial release techniques.  Education given on  application of manual, background of how and why it may help symptoms, and options for STM at home and continued assessment of response during, pt verbalized understanding and returned demo of self-STM with tennis ball    PATIENT EDUCATION:  10/16/2021 Education details: HEP update Person educated: Patient Education method: Explanation, Demonstration, Verbal cues, and Handouts Education comprehension: verbalized understanding and returned demonstration     HOME EXERCISE PROGRAM: Access Code: [email or web address] URL: [email or web address] Date: 10/16/2021 Prepared by: Scot Jun  Exercises - Supine Lower Trunk Rotation  - 2-3 x daily - 7 x weekly - 1 sets - 2 reps - 15 hold - Hooklying Single Knee to Chest Stretch  - 2-3 x daily - 7 x weekly - 1 sets - 2 reps - 15 hold - Seated Scapular Retraction  - 2-3 x daily - 7 x weekly - 1 sets - 5-10 reps - 3-5 hold - Seated Hip Abduction with Resistance  - 1-2 x daily - 7 x weekly - 1-2 sets - 10-15 reps - Seated March with Same Side Arm Raise   - 1-2 x daily - 7 x weekly - 1-2 sets - 10 reps   ASSESSMENT:   CLINICAL IMPRESSION: FOTO score reassessment did not show improvement but of note, Pt indicated other complaints (knees, shoulder , etc) that impacted answers to questions.   Pt has reported reduced symptoms in lateral hips with soft tissue  treatments.  Continued presentation of limitation in standing activity due to fatigue, back related symptoms (typical of stenosis)    OBJECTIVE IMPAIRMENTS Abnormal gait, decreased activity tolerance, decreased balance, decreased coordination, decreased endurance, decreased mobility, difficulty walking, decreased ROM, decreased strength, hypomobility, impaired perceived functional ability, increased muscle spasms, impaired flexibility, improper body mechanics, postural dysfunction, obesity, and pain.    ACTIVITY LIMITATIONS cleaning, community activity, driving, meal prep, and laundry.    PERSONAL FACTORS 3+ comorbidities: HTN, hypothyroidism, history of RA, COPD, DM, high cholesterol, CHF  are also affecting patient's functional outcome.      REHAB POTENTIAL: Fair to good   CLINICAL DECISION MAKING: Evolving/moderate complexity   EVALUATION COMPLEXITY: Moderate     GOALS: Goals reviewed with patient? Yes   Short term PT Goals (target date for Short term goals are 3 weeks 09/06/2021) Patient will demonstrate independent use of home exercise program to maintain progress from in clinic treatments. Goal status: MET - assessed 09/10/2021   Long term PT goals (target dates for all long term goals are 10 weeks  11/22/2021 )   1. Patient will demonstrate/report pain at worst less than or equal to 2/10 to facilitate minimal limitation in daily activity secondary to pain symptoms. Goal status: On Going - assessed 12/05/2021  2. Patient will demonstrate independent use of home exercise program to facilitate ability to maintain/progress functional gains from skilled physical therapy services. Goal status: Met- assessed 11/05/2021   3. Patient will demonstrate FOTO outcome > or = 37 % to indicate reduced disability due to condition. Goal status: On Going- assessed 12/05/2021   4.  Patient will demonstrate TUG c rollator < 14 seconds to indicate reduced fall risk and improved community integration  level.  Goal status: On Going  assessed 12/05/2021   5.  Patient will demonstrate lumbar ext AROM > or = 50% to facilitate upright standing/walking posture for household and community intergration.    Goal status: On Going - assessed 12/05/2021   6.  Patient will demonstrate bilateral knee MMT 5/5 throughout, Lt hip MMT 4/5 or greater  throughout to facilitate daily movement at [facility].   Goal status: On Going - assessed 12/05/2021   7.  Patient will demonstrate ability to walk > 500 ft s resting to facilitate ability to ambulation in community.  a.  Goal Status: On Going - assessed 12/05/2021     PLAN: PT FREQUENCY: 2x/week   PT DURATION: 4 weeks   PLANNED INTERVENTIONS: Therapeutic exercises, Therapeutic activity, Neuro Muscular re-education, Balance training, Gait training, Patient/Family education, Joint mobilization, Stair training, DME instructions, Dry Needling, Electrical stimulation, Cryotherapy, Moist heat, Taping, Ultrasound, Ionotophoresis 52m/ml Dexamethasone, and Manual therapy.  All included unless contraindicated.   PLAN FOR NEXT SESSION:  Progress note analysis.      MScot Jun PT, DPT, OCS, ATC 12/23/21  3:11 PM

## 2021-12-25 ENCOUNTER — Ambulatory Visit: Payer: Medicare Other | Admitting: Podiatry

## 2021-12-25 ENCOUNTER — Encounter: Payer: Self-pay | Admitting: Rehabilitative and Restorative Service Providers"

## 2021-12-25 ENCOUNTER — Ambulatory Visit (INDEPENDENT_AMBULATORY_CARE_PROVIDER_SITE_OTHER): Payer: Medicare Other | Admitting: Rehabilitative and Restorative Service Providers"

## 2021-12-25 DIAGNOSIS — R262 Difficulty in walking, not elsewhere classified: Secondary | ICD-10-CM

## 2021-12-25 DIAGNOSIS — M25552 Pain in left hip: Secondary | ICD-10-CM | POA: Diagnosis not present

## 2021-12-25 DIAGNOSIS — M5459 Other low back pain: Secondary | ICD-10-CM

## 2021-12-25 DIAGNOSIS — M6281 Muscle weakness (generalized): Secondary | ICD-10-CM

## 2021-12-25 DIAGNOSIS — R293 Abnormal posture: Secondary | ICD-10-CM | POA: Diagnosis not present

## 2021-12-25 NOTE — Therapy (Addendum)
OUTPATIENT PHYSICAL THERAPY TREATMENT NOTE [place]   Patient Name: [name] MRN: [id number] DOB:09/12/1940, 81 y.o., female Today's Date: 12/25/2021  PCP: [name], MD REFERRING PROVIDER: No ref. provider found    END OF SESSION:   PT End of Session - 12/25/21 1110     Visit Number 16    Number of Visits 20    Date for PT Re-Evaluation 01/06/22    Authorization Type Medicare and [place] at 15    Progress Note Due on Visit 20    PT Start Time 1102    PT Stop Time 1128    PT Time Calculation (min) 26 min    Activity Tolerance Patient limited by fatigue;Patient limited by pain    Behavior During Therapy WFL for tasks assessed/performed                Past Medical History:  Diagnosis Date   Acute on chronic diastolic (congestive) heart failure (HCC)    Anemia    iron deficiency anemia - secondary to blood loss ( chronic)    Anxiety    Arthritis    endstage changes bilateral knees/bilateral ankles.    Asthma    Carotid artery occlusion    Chronic fatigue    Chronic kidney disease    Closed left hip fracture (HCC)    Clotting disorder ([place])    pt denies this   Contusion of left knee    due to fall 1/14.   COPD (chronic obstructive pulmonary disease) (HCC)    pulmonary fibrosis   Depression, reactive    Diabetes mellitus    type II    Diastolic dysfunction    Difficulty in walking    Family history of heart disease    Generalized muscle weakness    Gout    High cholesterol    History of falling    Hypertension    Hypothyroidism    Interstitial lung disease ([place])    Meningioma of left sphenoid wing involving cavernous sinus ([place]) 02/17/2012   Continue diplopia, left eye pain and left headaches.     Morbid obesity ([place])    Neuromuscular disorder ([place])    diabetic neuropathy    Normal coronary arteries    cardiac catheterization performed  10/31/14   Pneumonia    RA (rheumatoid arthritis) ([place])    has been off methotreaxte since  10/13.   Spinal stenosis of lumbar region    Thyroid disease    Unspecified lack of coordination    URI (upper respiratory infection)    Past Surgical History:  Procedure Laterality Date   ABDOMINAL HYSTERECTOMY     BRAIN SURGERY     Gamma knife 10/13. Needs repeat spring  '14   CARDIAC CATHETERIZATION N/A 10/31/2014   Procedure: Right/Left Heart Cath and Coronary Angiography;  Surgeon: [name], MD;  Location: [facility];  Service: Cardiovascular;  Laterality: N/A;   CYST EXCISION     2 on face   CYST REMOVAL NECK     ESOPHAGOGASTRODUODENOSCOPY (EGD) WITH PROPOFOL N/A 09/14/2014   Procedure: ESOPHAGOGASTRODUODENOSCOPY (EGD) WITH PROPOFOL;  Surgeon: [name], MD;  Location: [facility];  Service: Endoscopy;  Laterality: N/A;   INCISION AND DRAINAGE HIP Left 01/16/2017   Procedure: IRRIGATION AND DEBRIDEMENT LEFT HIP;  Surgeon: [name], MD;  Location: [facility];  Service: Orthopedics;  Laterality: Left;   INTRAMEDULLARY (IM) NAIL INTERTROCHANTERIC Left 11/29/2016   Procedure: INTRAMEDULLARY (IM) NAIL INTERTROCHANTRIC;  Surgeon: [name], MD;  Location: [facility];  Service: Orthopedics;  Laterality: Left;   OVARY SURGERY     RIGHT HEART CATH N/A 08/12/2021   Procedure: RIGHT HEART CATH;  Surgeon: Lorretta Harp, MD;  Location: [facility];  Service: Cardiovascular;  Laterality: N/A;   SHOULDER SURGERY Left    TONSILLECTOMY  age 73   VIDEO BRONCHOSCOPY Bilateral 05/31/2013   Procedure: VIDEO BRONCHOSCOPY WITHOUT FLUORO;  Surgeon: Brand Males, MD;  Location: [facility];  Service: Cardiopulmonary;  Laterality: Bilateral;   video bronscoscopy  05/19/1998   lung   Patient Active Problem List   Diagnosis Date Noted   Pulmonary embolism ([place]) 11/15/2021   Tachycardia 10/22/2021   Pulmonary HTN ([place])    Unspecified protein-calorie malnutrition ([place]) 04/05/2021   Pressure injury of skin 03/31/2021   UTI (urinary tract  infection) 03/31/2021   Sepsis due to urinary tract infection ([place]) 03/30/2021   Acute encephalopathy 03/30/2021   Thrombocytopenia ([place]) 03/30/2021   Diabetic neuropathy ([place]) 05/24/2019   Hypokalemia 04/21/2019   Anticoagulated 06/25/2018   Chronic pain 06/25/2018   CRI (chronic renal insufficiency), stage 3 (moderate) (HCC) 06/25/2018   Drug-induced constipation 06/18/2018   DVT (deep venous thrombosis) ([place]) 06/03/2018   Primary osteoarthritis of both knees 05/26/2018   Body mass index (BMI) of 50-59.9 in adult ([place]) 05/26/2018   Acquired hypothyroidism 05/26/2018   Chronic bilateral low back pain without sciatica 02/17/2018   Astigmatism with presbyopia, bilateral 03/26/2017   Cortical age-related cataract of both eyes 03/26/2017   Family history of glaucoma 03/26/2017   Long term current use of oral hypoglycemic drug 03/26/2017   Nuclear sclerotic cataract of both eyes 03/26/2017   Pain in left hip 02/18/2017   Closed nondisplaced intertrochanteric fracture of left femur with delayed healing 01/14/2017   Gout 11/29/2016   Depression 11/29/2016   Closed left hip fracture ([place]) 11/29/2016   GERD (gastroesophageal reflux disease) 11/29/2016   Closed intertrochanteric fracture of hip, left, initial encounter ([place]) 11/29/2016   Physical deconditioning 07/31/2015   Pulmonary fibrosis, postinflammatory ([place]) 07/31/2015   Bronchiectasis without complication ([place]) 87/68/1157   Tendinitis of left rotator cuff 06/13/2015   Chronic diastolic CHF (congestive heart failure) ([place]) 04/21/2015   Acute kidney injury superimposed on chronic kidney disease ([place]) 04/17/2015   Chronic respiratory failure with hypoxia ([place]) 02/09/2015   Dyspnea and respiratory abnormality 01/03/2015   Normal coronary arteries 11/02/2014   Morbid obesity ([place]) 10/30/2014   Dysphagia 08/21/2014   Chronic fatigue 06/21/2014   DOE (dyspnea on exertion) 05/24/2014   Primary osteoarthritis of left knee 04/26/2014    High risk medication use 04/17/2014   Aphasia 02/12/2014   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 ([place]) 01/18/2014   Lumbar facet arthropathy 01/18/2014   Bilateral edema of lower extremity 05/30/2013   Chronic cough 05/24/2013   ILD (interstitial lung disease) ([place]) 04/10/2013   Spinal stenosis of lumbar region with radiculopathy 07/12/2012   Inability to walk 07/12/2012   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 sinus ([place]) 02/17/2012   Chest pain of uncertain etiology 26/20/3559   Rheumatoid arthritis ([place]) 01/28/2012   Primary osteoarthritis of right knee 01/28/2012   Dyslipidemia    Thyroid disease    Essential hypertension     REFERRING PROVIDER: [name], MD   REFERRING DIAG: M54.50,G89.29 (ICD-10-CM) - Chronic bilateral low back pain without sciatica M25.552 (ICD-10-CM) - Pain in left hip  ONSET DATE:  Dec 2022  THERAPY DIAG:  Other low back pain  Pain in left hip  Abnormal posture  Muscle weakness (generalized)  Difficulty in walking, not elsewhere classified  PERTINENT HISTORY: HTN, hypothyroidism, history of RA, COPD, DM, high cholesterol, CHF.  ORIF Lt femur from hip fracture "about 4-5 years ago"  PRECAUTIONS: May require supplemental oxygen  SUBJECTIVE:  Pt indicated back doing ok today.  Reported having some Rt hip pain upon arrival but not Lt hip.  Reported yesterday was good.   Pt indicated Lt hip pain doing much better than 3 weeks ago.   PAIN:  NPRS scale: 4/10 (rt hip), back Pain location: Rt hip mainly, back  Pain description: achy tightness Aggravating factors: standing/walking prolonged, lying down on back Relieving factors: Medicine, patch   OBJECTIVE:    PATIENT SURVEYS:   12/23/2021 : update 29% 11/05/2021: update 32 10/08/2021: update 37 % 09/12/2021: update 43% 08/16/2021 FOTO intake: 33 predicted:  37   SCREENING FOR RED FLAGS: 08/16/2021 Bowel or bladder incontinence: No Cauda equina syndrome: No      COGNITION:            08/16/2021 Overall cognitive status: Within functional limits for tasks assessed                                 SENSATION: 08/16/2021 Light touch: WFL   MUSCLE LENGTH: 08/16/2021:  none tested today   POSTURE:  08/16/2021 : rounded shoulders, increased thoracic kyphosis, reduced lumbar lordosis in standing, forward trunk lean noted   PALPATION: 08/16/2021 : Tenderness across lower back bilateral, Lt > Rt, Lateral Lt hip tenderness to touch- trigger points noted 12/05/2021: trigger points and localized tenderness noted throughout bilateral quad (increased amount in lateral aspect of thigh), glute med/min bilateral, TFL, along IT band bilateral.  Also tender to touch on greater trochanter bilateral but reduced compared to myofascial palpation tenderness   LUMBAR ROM:    Active  AROM  08/16/2021 AROM 10/08/2021 AROM 11/05/2021 AROM 12/25/2021  Flexion To mid thighs c pain     Extension To neutral, ERP lumbar noted.  X5 continued pain complaints at end range < 25 % but more than neutral c ERP noted Can get to neutral with knees straight To neutral in standing c end range pain  Right lateral flexion       Left lateral flexion       Right rotation       Left rotation        (Blank rows = not tested)   LE ROM:    PROM Right 09/02/2021 Left 09/02/2021 Right 10/16/2021 Left 10/16/2021  Hip flexion  95     Hip extension        Hip abduction        Hip adduction        Hip internal rotation  25 in 90 deg flexion 35 in 90 deg flexion  23 in 90 deg flexion seated 45 in 90 deg flexion seated  Hip external rotation  55 in 90 deg flexion 45 in 90 deg flexion  57 in 90 deg flexion seated  45 in 90 deg flexion seated (pain in Lt knee reported)  Knee flexion        Knee extension        Ankle dorsiflexion        Ankle plantarflexion        Ankle inversion        Ankle eversion         (  Blank rows = not tested)   LE MMT:   MMT Right 08/16/2021 Left 08/16/2021  Right 10/08/2021 Left 10/08/2021 Left 11/21/2021 Right 12/25/2021 Left 12/25/2021  Hip flexion 4/5 3+/5 5/5 4/5 5/5 5/5 4/5  Hip extension         Hip abduction         Hip adduction           Hip internal rotation           Hip external rotation           Knee flexion 5/5 5/5    5/5 5/5  Knee extension 5/5 4/5    5/5 5/5  Ankle dorsiflexion 5/5 5/5    5/5 5/5  Ankle plantarflexion           Ankle inversion           Ankle eversion            (Blank rows = not tested)   LUMBAR SPECIAL TESTS:  08/16/2021 (-) slump test bilateral   FUNCTIONAL TESTS:  12/17/2021:  TUG c rollator :  18.9 seconds  11/11/2021:  TUG c rollator 16.87  10/08/2021:  TUG : c rollator 17.02 seconds   18 inch chair transfer s UE on 1st try  08/16/2021 TUG: c rollator 27.83 seconds   GAIT: 12/05/2021:  Rollator c forearms on handholds, < 100 ft today limited due to pain in Rt shoulder/back.   11/21/2021: 82 ft c rollator - limited by Rt knee pain  10/08/2021: 185 ft c rollator  09/10/2021:  Rollator 96 ft (stopped due to out of breath)  08/16/2021 Distance walked: Rollator household distances < 150 ft, modified independent                         Has wheelchair for community.        TODAY'S TREATMENT  12/25/2021  Therex:   Nustep UE/LE lvl 5 16 mins   Leg press: double leg 50 lbs 2 x 10    Attempted standing lumbar movement to neutral but unable due to pain   Seated Marching c pause in end range x 10 bilateral   Seated cervical retraction isometrics 5 sec hold x 10 into clinician   Seated scapular retraction c bilateral shoulder ER red band 2 x 10 (robbery ex)       Manual   STM c percussive device assist to Rt glute med/min  12/23/2021  Therex:   Nustep UE/LE lvl 5 16 mins   Leg press: double leg 50 lbs 2 x 10    Standing in walker bilateral UE assist hip extension x 10 bilateral   Seated scapular retraction 5 sec hold x 10 (unsupported)      Sit to stand from 18 inch chair x 5   Manual   STM c  percussive device assist to Lt  glute med/min, quad c focus on anterior/lateral aspect of proximal and middle 1/3 for myofascial release techniques.    12/17/2021  Therex:   Nustep UE/LE lvl 5 10 mins   Seated small pball press down UE 10 sec hold x 6   Seated small pball hip add squeeze 5 sec hold x 15   Seated scapular retraction 5 second hold x 10   Seated bilateral UE rows c scapular retraction x 20 c green band   Seated green band clam shell 20x bilateral       Sit to stand from  18 inch chair x 3, TUG performed   Manual   STM c percussive device assist to Lt  glute med/min, quad c focus on anterior/lateral aspect of proximal and middle 1/3 for myofascial release techniques.  Same performed to bilateral lumbar paraspinals and QL.    PATIENT EDUCATION:  10/16/2021 Education details: HEP update Person educated: Patient Education method: Explanation, Demonstration, Verbal cues, and Handouts Education comprehension: verbalized understanding and returned demonstration     HOME EXERCISE PROGRAM: Access Code: [email or web address] URL: [email or web address] Date: 10/16/2021 Prepared by: Scot Jun  Exercises - Supine Lower Trunk Rotation  - 2-3 x daily - 7 x weekly - 1 sets - 2 reps - 15 hold - Hooklying Single Knee to Chest Stretch  - 2-3 x daily - 7 x weekly - 1 sets - 2 reps - 15 hold - Seated Scapular Retraction  - 2-3 x daily - 7 x weekly - 1 sets - 5-10 reps - 3-5 hold - Seated Hip Abduction with Resistance  - 1-2 x daily - 7 x weekly - 1-2 sets - 10-15 reps - Seated March with Same Side Arm Raise   - 1-2 x daily - 7 x weekly - 1-2 sets - 10 reps   ASSESSMENT:   CLINICAL IMPRESSION: Difficulty in standing c moderate to severe forward trunk lean noted throughout due to back complaints c movement towards neutral positioning.  Pain noted in standing limited activity to minimal standing today.   Had to stop intervention short of time today due to general fatigue.       OBJECTIVE IMPAIRMENTS Abnormal gait, decreased activity tolerance, decreased balance, decreased coordination, decreased endurance, decreased mobility, difficulty walking, decreased ROM, decreased strength, hypomobility, impaired perceived functional ability, increased muscle spasms, impaired flexibility, improper body mechanics, postural dysfunction, obesity, and pain.    ACTIVITY LIMITATIONS cleaning, community activity, driving, meal prep, and laundry.    PERSONAL FACTORS 3+ comorbidities: HTN, hypothyroidism, history of RA, COPD, DM, high cholesterol, CHF  are also affecting patient's functional outcome.      REHAB POTENTIAL: Fair to good   CLINICAL DECISION MAKING: Evolving/moderate complexity   EVALUATION COMPLEXITY: Moderate     GOALS: Goals reviewed with patient? Yes   Short term PT Goals (target date for Short term goals are 3 weeks 09/06/2021) Patient will demonstrate independent use of home exercise program to maintain progress from in clinic treatments. Goal status: MET - assessed 09/10/2021   Long term PT goals (target dates for all long term goals are 10 weeks  11/22/2021 )   1. Patient will demonstrate/report pain at worst less than or equal to 2/10 to facilitate minimal limitation in daily activity secondary to pain symptoms. Goal status: On Going - assessed 12/05/2021  2. Patient will demonstrate independent use of home exercise program to facilitate ability to maintain/progress functional gains from skilled physical therapy services. Goal status: Met- assessed 11/05/2021   3. Patient will demonstrate FOTO outcome > or = 37 % to indicate reduced disability due to condition. Goal status: On Going- assessed 12/05/2021   4.  Patient will demonstrate TUG c rollator < 14 seconds to indicate reduced fall risk and improved community integration level.  Goal status: On Going  assessed 12/05/2021   5.  Patient will demonstrate lumbar ext AROM > or = 50% to facilitate upright  standing/walking posture for household and community intergration.    Goal status: On Going - assessed 12/05/2021   6.  Patient will demonstrate bilateral knee MMT 5/5 throughout, Lt  hip MMT 4/5 or greater throughout to facilitate daily movement at PLOF.   Goal status: On Going - assessed 12/05/2021   7.  Patient will demonstrate ability to walk > 500 ft s resting to facilitate ability to ambulation in community.  a.  Goal Status: On Going - assessed 12/05/2021     PLAN: PT FREQUENCY: 2x/week   PT DURATION: 4 weeks   PLANNED INTERVENTIONS: Therapeutic exercises, Therapeutic activity, Neuro Muscular re-education, Balance training, Gait training, Patient/Family education, Joint mobilization, Stair training, DME instructions, Dry Needling, Electrical stimulation, Cryotherapy, Moist heat, Taping, Ultrasound, Ionotophoresis 64m/ml Dexamethasone, and Manual therapy.  All included unless contraindicated.   PLAN FOR NEXT SESSION:   Recert or transitioning to HEP plan by 01/06/2022.  Progressive postural tolerance and general strengthening as tolerated.     MScot Jun PT, DPT, OCS, ATC 12/25/21  11:29 AM  PHYSICAL THERAPY DISCHARGE SUMMARY  Visits from Start of Care: 16  Current functional level related to goals / functional outcomes: See note   Remaining deficits: See note   Education / Equipment: HEP   Patient agrees to discharge. Patient goals were partially met. Patient is being discharged due to not returning since the last visit.  MScot Jun PT, DPT, OCS, ATC 02/05/22  1:22 PM

## 2021-12-31 ENCOUNTER — Telehealth: Payer: Self-pay | Admitting: Family Medicine

## 2021-12-31 NOTE — Telephone Encounter (Signed)
Pt is calling and missed a call. Pt would like ultrasound of her arm result

## 2022-01-01 NOTE — Telephone Encounter (Signed)
Spoke with pt, is aware.

## 2022-01-06 ENCOUNTER — Ambulatory Visit (INDEPENDENT_AMBULATORY_CARE_PROVIDER_SITE_OTHER): Payer: Medicare Other | Admitting: Family Medicine

## 2022-01-06 VITALS — BP 140/88 | HR 92 | Temp 98.4°F

## 2022-01-06 DIAGNOSIS — J849 Interstitial pulmonary disease, unspecified: Secondary | ICD-10-CM

## 2022-01-06 DIAGNOSIS — M05742 Rheumatoid arthritis with rheumatoid factor of left hand without organ or systems involvement: Secondary | ICD-10-CM

## 2022-01-06 DIAGNOSIS — M05741 Rheumatoid arthritis with rheumatoid factor of right hand without organ or systems involvement: Secondary | ICD-10-CM | POA: Diagnosis not present

## 2022-01-06 DIAGNOSIS — I890 Lymphedema, not elsewhere classified: Secondary | ICD-10-CM | POA: Diagnosis not present

## 2022-01-06 DIAGNOSIS — R600 Localized edema: Secondary | ICD-10-CM

## 2022-01-06 NOTE — Progress Notes (Signed)
Subjective:    Patient ID: [name], female    DOB: Sep 20, 1940, 81 y.o.   MRN: [id number]  Chief Complaint  Patient presents with   Follow-up    Not improving. Still painful and swollen. Feels it is a little larger. Sometimes it is a shooting pain at times, right arm.  Left arm has some swelling as well .   Patient accompanied by her daughter.  HPI Patient is an 81 yo female with pmh sig for HTN, h/o DVT and PE on chronic anticoagulation with eliquis, pulmonary fibrosis, ILD on O2, RA, GERD, DM II, OA, Chronic renal insufficiency, HLD, thrombocytopenia, h/o meningioma of L sphenoid wing with prior gamma knife therapy, obesity, lymphedema, hypothyroidism, diabetic neuropathy, history of depression, deconditioning, gout, chronic low back pain 2/2 lumbar stenosis who was seen today for f/u.   Pt with continued edema and pain of R upper arm.  Worse with lifting.  Sore to the touch.  Recent RUE u/s was negative for thrombosis.  On Eliquis 5 mg BID 2/2 h/o PE and DVT.  Nothing helping.  Denies injury.  Areas of ecchymosis from Eliquis use improving.  B/l LE lymphedema stable.  Not currently wrapping LEs at home.  Not currently requiring O2 during the day.  Followed by [facility] Bong and local Pulm office.  Pt with continued chronic low back pain, seen by pain management.  Pain in hands 2/2 RA.  Previously controlled on prednisone until dose was inadvertently adjusted after hospitalization/stay in rehab.  Unable to fully extend fingers of bilateral hands.  Followed by rheumatology.   Past Medical History:  Diagnosis Date   Acute on chronic diastolic (congestive) heart failure (HCC)    Anemia    iron deficiency anemia - secondary to blood loss ( chronic)    Anxiety    Arthritis    endstage changes bilateral knees/bilateral ankles.    Asthma    Carotid artery occlusion    Chronic fatigue    Chronic kidney disease    Closed left hip fracture (HCC)    Clotting disorder ([place])    pt denies  this   Contusion of left knee    due to fall 1/14.   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     Morbid obesity ([place])    Neuromuscular disorder ([place])    diabetic neuropathy    Normal coronary arteries    cardiac catheterization performed  10/31/14   Pneumonia    RA (rheumatoid arthritis) ([place])    has been off methotreaxte since 10/13.   Spinal stenosis of lumbar region    Thyroid disease    Unspecified lack of coordination    URI (upper respiratory infection)     Allergies  Allergen Reactions   Codeine Swelling and Other (See Comments)    Facial swelling Chest pain and swelling in legs     Infliximab Anaphylaxis    ([place]) "sent me into shock"  Lisinopril Swelling    Face and neck swelling     ROS General: Denies fever, chills, night sweats, changes in weight, changes in appetite HEENT: Denies headaches, ear pain, changes in vision, rhinorrhea, sore throat CV: Denies CP, palpitations, SOB, orthopnea Pulm: Denies SOB, cough, wheezing GI: Denies abdominal pain, nausea, vomiting, diarrhea, constipation GU: Denies dysuria, hematuria, frequency, vaginal discharge Msk: Denies muscle cramps, joint pains   +chronic low back pain, RUE pain/edema Neuro: Denies weakness, numbness, tingling Skin: Denies rashes +ecchymosis Psych: Denies depression, anxiety, hallucinations     Objective:    Blood pressure (!) 140/88, pulse 92, temperature 98.4 F (36.9 C), temperature source Oral, SpO2 91 %.  Gen. Pleasant,  well-nourished, in no distress, normal affect   HEENT: [place]/AT, face symmetric, conjunctiva clear, no scleral icterus, PERRLA, EOMI, nares patent without drainage, pharynx without erythema or exudate. Lungs: no accessory muscle use, CTAB, no wheezes or rales Cardiovascular: RRR, no m/r/g, b/l LE edema Abdomen: BS present, soft, NT/ND Musculoskeletal: Edema of RUE, TT light palpation. No deformities, no cyanosis or clubbing, normal tone Neuro:  A&Ox3, CN II-XII intact, gait not assessed as sitting in transport wheelchair. Skin:  Warm, no lesions/ rash.  Scattered areas of ecchymosis on extremities.   Wt Readings from Last 3 Encounters:  12/20/21 226 lb 6.4 oz (102.7 kg)  12/09/21 226 lb 6.4 oz (102.7 kg)  12/02/21 219 lb (99.3 kg)    Lab Results  Component Value Date   WBC 6.9 10/29/2021   HGB 12.2 10/29/2021   HCT 38.1 10/29/2021   PLT 144 (L) 10/29/2021   GLUCOSE 138 (H) 10/29/2021   CHOL 136 09/26/2020   TRIG 283.0 (H) 09/26/2020   HDL 40.00 09/26/2020   LDLDIRECT 59.0 09/26/2020   LDLCALC 75 09/22/2019   ALT 26 11/05/2021   AST 19 11/05/2021   NA 145 (H) 10/29/2021   K 3.6 10/29/2021   CL 100 10/29/2021   CREATININE 0.84 10/29/2021   BUN 18 10/29/2021   CO2 27 10/29/2021   TSH 1.33 09/25/2021   INR 1.0 03/30/2021   HGBA1C 6.5 (H) 04/02/2021    Assessment/Plan:  Edema of right upper arm -Continued pain and noticeable edema in RUE -Etiology unclear. Consider thoracic outlet syndrome (vTOS), inflammatory arthritis.   -thrombosis less likely as doppler ultrasound RUE negative and anticoagulated with Eliquis. -review CT angio Chest 10/30/21 and CXR 12/20/21.   -supportive care -Continue Eliquis -further recs such as referral to PT or vascular surg based on imaging review.  Lymphedema -Bilateral LE  -stable -supportive care encouraged.  Elevating LEs, compression socks or TED hose, monitoring sodium intake  ILD (interstitial lung disease) (HCC) -stable -on RA during  visit.  Continue 2-3 L with exertion and at night -continue current meds including nebs and inhalers. -continue current meds as managed by [facility]  Rheumatoid arthritis involving both hands with positive rheumatoid factor ([place]) -continue prednisone and other meds per Rheum -continue f/u with Rheumatology  F/u prn  Grier Mitts, MD

## 2022-01-08 ENCOUNTER — Telehealth: Payer: Self-pay | Admitting: Family Medicine

## 2022-01-08 DIAGNOSIS — I5032 Chronic diastolic (congestive) heart failure: Secondary | ICD-10-CM

## 2022-01-08 MED ORDER — POTASSIUM CHLORIDE ER 20 MEQ PO TBCR: 20.0000 meq | EXTENDED_RELEASE_TABLET | Freq: Every day | ORAL | 0 refills | Status: DC

## 2022-01-08 NOTE — Telephone Encounter (Signed)
Refill sent to requested pharmacy.

## 2022-01-08 NOTE — Telephone Encounter (Signed)
Refill request Potassium Chloride ER 20 MEQ TBCR, olmesartan (BENICAR) 40 MG tablet [facility] - [place], [place] - [address] Phone:  [phone]  Fax:  [phone]

## 2022-01-10 ENCOUNTER — Ambulatory Visit (INDEPENDENT_AMBULATORY_CARE_PROVIDER_SITE_OTHER): Payer: Medicare Other | Admitting: Podiatry

## 2022-01-10 ENCOUNTER — Encounter: Payer: Self-pay | Admitting: Podiatry

## 2022-01-10 DIAGNOSIS — M7751 Other enthesopathy of right foot: Secondary | ICD-10-CM

## 2022-01-10 DIAGNOSIS — M7752 Other enthesopathy of left foot: Secondary | ICD-10-CM | POA: Diagnosis not present

## 2022-01-10 MED ORDER — TRIAMCINOLONE ACETONIDE 10 MG/ML IJ SUSP
20.0000 mg | Freq: Once | INTRAMUSCULAR | Status: AC
  Administered 2022-01-10: 20 mg

## 2022-01-13 NOTE — Progress Notes (Signed)
Subjective:   Patient ID: [name], female   DOB: 81 y.o.   MRN: [id number]   HPI Patient presents stating she is developed pain again in both of her ankles and she also has nail disease but the ankles are which really bothering her.  She did well for an extended period of time   ROS      Objective:  Physical Exam  Vascular status intact negative Bevelyn Buckles' sign noted exquisite discomfort in the right and left sinus tarsi with fluid buildup within the deep joint surfaces and thickened toenail deformity 1-5 both feet     Assessment:  Amatory capsulitis of the sinus tarsi bilateral along with nail disease     Plan:  H&P reviewed condition sterile prep and did injections of the sinus tarsi bilateral 3 mg Kenalog 5 mg Xylocaine applied sterile dressings reappoint to recheck  X-rays indicate moderate arthritis within the subtalar joint bilateral flatfoot deformity no other pathology noted

## 2022-01-22 NOTE — Progress Notes (Incomplete)
Subjective:    Patient ID: [name], female    DOB: 03-30-1941, 81 y.o.   MRN: [id number]  Chief Complaint  Patient presents with  . Follow-up    Not improving. Still painful and swollen. Feels it is a little larger. Sometimes it is a shooting pain at times, right arm.  Left arm has some swelling as well .   Patient accompanied by her daughter.  HPI Patient is an 81 yo female with pmh sig for HTN, h/o DVT and PE on eliquis, ILD on O2, RA, GERD, DM II, OA, CKD, HLD, thrombocytopenia, obesity, lymphedema, hypothyroidism, diabetic neuropathy, history of depression, deconditioning, gout, chronic low back pain who was seen today for f/u.   Pt with continued edema and pain of R upper arm.  Recent RUE u/s was negative for thrombosis.  On Eliquis 2/2 h/o PE and DVT.  Nothing helping.  Areas of ecchymosis from Eliquis use improving.  B/l LE lymphedema stable.  Not currently wrapping LEs at home.  Not currently requiring O2 during the day.  Followed by Rodolph Bong and local Pulm office.  Pt with continued chronic low back pain, seen by pain management.  Pain in hands 2/2 RA.  Previously controlled on prednisone until dose was inadvertently adjusted after hospitalization/stay in rehab.  Unable to fully extend fingers of bilateral hands.  Followed by rheumatology.   Past Medical History:  Diagnosis Date  . Acute on chronic diastolic (congestive) heart failure ([place])   . Anemia    iron deficiency anemia - secondary to blood loss ( chronic)   . Anxiety   . Arthritis    endstage changes bilateral knees/bilateral ankles.   . Asthma   . Carotid artery occlusion   . Chronic fatigue   . Chronic kidney disease   . Closed left hip fracture ([place])   . Clotting disorder ([place])    pt denies this  . Contusion of left knee    due to fall 1/14.  [name] COPD (chronic obstructive pulmonary disease) (HCC)    pulmonary fibrosis  . Depression, reactive   . Diabetes mellitus    type II   . Diastolic  dysfunction   . Difficulty in walking   . Family history of heart disease   . Generalized muscle weakness   . Gout   . High cholesterol   . History of falling   . Hypertension   . Hypothyroidism   . Interstitial lung disease ([place])   . 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    . Morbid obesity ([place])   . Neuromuscular disorder ([place])    diabetic neuropathy   . Normal coronary arteries    cardiac catheterization performed  10/31/14  . Pneumonia   . RA (rheumatoid arthritis) ([place])    has been off methotreaxte since 10/13.  [name] Spinal stenosis of lumbar region   . Thyroid disease   . Unspecified lack of coordination   . URI (upper respiratory infection)     Allergies  Allergen Reactions  . Codeine Swelling and Other (See Comments)    Facial swelling Chest pain and swelling in legs    . Infliximab Anaphylaxis    ([place]) "sent me into shock"   . Lisinopril Swelling    Face and neck swelling     ROS General: Denies fever, chills, night sweats, changes in weight, changes in appetite HEENT: Denies headaches, ear pain, changes in  vision, rhinorrhea, sore throat CV: Denies CP, palpitations, SOB, orthopnea Pulm: Denies SOB, cough, wheezing GI: Denies abdominal pain, nausea, vomiting, diarrhea, constipation GU: Denies dysuria, hematuria, frequency, vaginal discharge Msk: Denies muscle cramps, joint pains   +chronic low back pain, RUE pain/edema Neuro: Denies weakness, numbness, tingling Skin: Denies rashes +ecchymosis Psych: Denies depression, anxiety, hallucinations     Objective:    Blood pressure (!) 140/88, pulse 92, temperature 98.4 F (36.9 C), temperature source Oral, SpO2 91 %.  Gen. Pleasant, well-nourished, in no distress, normal affect   HEENT: [place]/AT, face symmetric, conjunctiva clear, no scleral icterus, PERRLA, EOMI, nares patent without drainage, pharynx without  erythema or exudate. Lungs: no accessory muscle use, CTAB, no wheezes or rales Cardiovascular: RRR, no m/r/g, b/l LE edema Abdomen: BS present, soft, NT/ND Musculoskeletal: Edema of RUE. No deformities, no cyanosis or clubbing, normal tone Neuro:  A&Ox3, CN II-XII intact, gait not assessed as sitting in transport wheelchair. Skin:  Warm, no lesions/ rash.  Scattered areas of ecchymosis   Wt Readings from Last 3 Encounters:  12/20/21 226 lb 6.4 oz (102.7 kg)  12/09/21 226 lb 6.4 oz (102.7 kg)  12/02/21 219 lb (99.3 kg)    Lab Results  Component Value Date   WBC 6.9 10/29/2021   HGB 12.2 10/29/2021   HCT 38.1 10/29/2021   PLT 144 (L) 10/29/2021   GLUCOSE 138 (H) 10/29/2021   CHOL 136 09/26/2020   TRIG 283.0 (H) 09/26/2020   HDL 40.00 09/26/2020   LDLDIRECT 59.0 09/26/2020   LDLCALC 75 09/22/2019   ALT 26 11/05/2021   AST 19 11/05/2021   NA 145 (H) 10/29/2021   K 3.6 10/29/2021   CL 100 10/29/2021   CREATININE 0.84 10/29/2021   BUN 18 10/29/2021   CO2 27 10/29/2021   TSH 1.33 09/25/2021   INR 1.0 03/30/2021   HGBA1C 6.5 (H) 04/02/2021    Assessment/Plan:  Edema of right upper arm -Continued pain and noticeable edema in RUE -Etiology unclear. -Doppler ultrasound RUE negative -Continue Eliquis  Lymphedema -Bilateral LE stable -supportive care encouraged.  Elevating LEs, compression socks or TED hose, monitoring sodium intake  ILD (interstitial lung disease) (HCC)  Rheumatoid arthritis involving both hands with positive rheumatoid factor ([place]) -continue prednisone and other meds per Rheum -continue f/u with Rheumatology  F/u prn  Grier Mitts, MD

## 2022-01-23 ENCOUNTER — Encounter: Payer: Self-pay | Admitting: Family Medicine

## 2022-01-28 ENCOUNTER — Encounter: Payer: Self-pay | Admitting: Registered Nurse

## 2022-01-28 ENCOUNTER — Encounter: Payer: Medicare Other | Attending: Physical Medicine & Rehabilitation | Admitting: Registered Nurse

## 2022-01-28 ENCOUNTER — Encounter: Payer: Self-pay | Admitting: Cardiovascular Disease

## 2022-01-28 ENCOUNTER — Ambulatory Visit (INDEPENDENT_AMBULATORY_CARE_PROVIDER_SITE_OTHER): Payer: Medicare Other | Admitting: Cardiovascular Disease

## 2022-01-28 VITALS — BP 149/92 | HR 96 | Ht 64.0 in | Wt 221.0 lb

## 2022-01-28 DIAGNOSIS — M2550 Pain in unspecified joint: Secondary | ICD-10-CM | POA: Diagnosis not present

## 2022-01-28 DIAGNOSIS — M5416 Radiculopathy, lumbar region: Secondary | ICD-10-CM | POA: Diagnosis not present

## 2022-01-28 DIAGNOSIS — I10 Essential (primary) hypertension: Secondary | ICD-10-CM | POA: Insufficient documentation

## 2022-01-28 DIAGNOSIS — E785 Hyperlipidemia, unspecified: Secondary | ICD-10-CM | POA: Insufficient documentation

## 2022-01-28 DIAGNOSIS — I5032 Chronic diastolic (congestive) heart failure: Secondary | ICD-10-CM | POA: Diagnosis not present

## 2022-01-28 DIAGNOSIS — Z5181 Encounter for therapeutic drug level monitoring: Secondary | ICD-10-CM

## 2022-01-28 DIAGNOSIS — M170 Bilateral primary osteoarthritis of knee: Secondary | ICD-10-CM

## 2022-01-28 DIAGNOSIS — Z79891 Long term (current) use of opiate analgesic: Secondary | ICD-10-CM

## 2022-01-28 DIAGNOSIS — R413 Other amnesia: Secondary | ICD-10-CM | POA: Diagnosis not present

## 2022-01-28 DIAGNOSIS — R0609 Other forms of dyspnea: Secondary | ICD-10-CM | POA: Diagnosis not present

## 2022-01-28 DIAGNOSIS — G894 Chronic pain syndrome: Secondary | ICD-10-CM

## 2022-01-28 DIAGNOSIS — M7062 Trochanteric bursitis, left hip: Secondary | ICD-10-CM | POA: Diagnosis not present

## 2022-01-28 DIAGNOSIS — M7061 Trochanteric bursitis, right hip: Secondary | ICD-10-CM

## 2022-01-28 MED ORDER — HYDRALAZINE HCL 25 MG PO TABS: 25.0000 mg | ORAL_TABLET | Freq: Two times a day (BID) | ORAL | 0 refills | Status: DC

## 2022-01-28 MED ORDER — HYDRALAZINE HCL 25 MG PO TABS: 25.0000 mg | ORAL_TABLET | Freq: Two times a day (BID) | ORAL | 3 refills | Status: DC

## 2022-01-28 MED ORDER — MORPHINE SULFATE 15 MG PO TABS
15.0000 mg | ORAL_TABLET | Freq: Two times a day (BID) | ORAL | 0 refills | Status: DC
Start: 2022-01-28 — End: 2022-01-28

## 2022-01-28 MED ORDER — MORPHINE SULFATE ER 15 MG PO TBCR: 15.0000 mg | EXTENDED_RELEASE_TABLET | Freq: Two times a day (BID) | ORAL | 0 refills | Status: DC

## 2022-01-28 MED ORDER — MORPHINE SULFATE ER 15 MG PO TBCR
15.0000 mg | EXTENDED_RELEASE_TABLET | Freq: Two times a day (BID) | ORAL | 0 refills | Status: DC
Start: 2022-01-28 — End: 2022-03-31

## 2022-01-28 MED ORDER — MORPHINE SULFATE 15 MG PO TABS
15.0000 mg | ORAL_TABLET | Freq: Two times a day (BID) | ORAL | 0 refills | Status: DC
Start: 2022-01-28 — End: 2022-03-31

## 2022-01-28 NOTE — Assessment & Plan Note (Signed)
History of dyslipidemia on atorvastatin with lipid profile performed 09/26/2020 revealing total cholesterol 136, LDL 75 and HDL 40.

## 2022-01-28 NOTE — Assessment & Plan Note (Signed)
History of essential hypertension a blood pressure measured today at 142/88.  She says her blood blood pressure at home remains high on Benicar and Toprol.  I am going to add low-dose hydralazine 25 mg p.o. twice daily I will have her keep a blood pressure log for 30 days and see a Pharm.D. back in 4 weeks to review and make appropriate changes.

## 2022-01-28 NOTE — Assessment & Plan Note (Signed)
History of chronic diastolic heart failure on furosemide.

## 2022-01-28 NOTE — Progress Notes (Signed)
01/28/2022 [name]   1941/05/19  [id number]  Primary Physician [name], MD Primary Cardiologist: Lorretta Harp MD [name], [place]  HPI:  [name] is a 81 y.o.  moderately overweight married African American female mother of 6 children, grandmother of a grandchildren referred by [name] for cardiovascular evaluation because of progressive dyspnea on exertion and chest pain. I last saw her in the  office 11/15/2021.[name]  She is accompanied by her granddaughter Amina.  she did see Kerin Ransom, PA-C in the office 11/16/2019.[name]  Her cardiovascular risk factor are  notable for a strong family history of heart disease with her mother and 20 brothers old with an ischemic heart disease. She has never had a heart attack or stroke. She does have interstitial lung disease as well as rheumatoid arthritis. She is on home oxygen. She also has a brain tumor/meningioma 2 with gamma knife therapy  She's had progressive disabling dyspnea as well as daily chest pain. She underwent cardiac catheterization 10/31/14 by Dr. Irish Lack revealing normal coronary arteries and normal LV function. She did have mild pulmonary hypertension. 2-D echo showed normal LV function with grade 1 diastolic dysfunction and mild pulmonary hypertension. Her symptoms really have not changed on by mouth diuretics. She was referred to [facility] for further evaluation and treatment of interstitial lung disease. Since beginning an antibiotic and an inhaled bronchodilator her shortness of breath is somewhat improved.   She had an episode of syncope prior to seeing Kerin Ransom thought to be related to overmedication and dehydration from poor p.o. intake.  Her meds were adjusted.  She was also hospitalized last month with sepsis related to a tooth infection with and was in the hospital for 10 days.  She had fluid overload and has since diuresed.  She gets occasional atypical chest pain.  Her  blood pressure is mildly elevated when she takes it at home.  She is on furosemide 60 mg a day.   She had been seeing one of our our Pharm.D. for medication titration for hypertension which is under better control.    She had noticed increasing weight gain lower extremity edema as well as some atypical chest pain and left flank pain.  She did see her nephrologist who put her on antibiotic for presumed UTI and increased her diuretic from furosemide 60 to 80 mg a day.  Her symptoms sound like she has an "upper urinary tract infection" which may have exacerbated her diastolic heart failure.  Her nephrologist is following her volume status and renal function closely.   I did a right heart cath at the request of [name] on 08/12/2021 which revealed a pulmonary artery pressure of 43/19 with a mean of 30, right atrial pressure of 20/18 and a wedge pressure of 22 mean.  Her cardiac output was 5 L/min.  Her most recent 2D echo performed 11/06/2021 revealed normal LV systolic function with out significant pulmonary hypertension.  She did see [name] in the office on 10/28/2021 complaining of increasing shortness of breath.  As result a chest CTA was performed on 10/30/2021 revealing multiple subsegmental pulmonary emboli in the right upper and middle lobe.  She has subsequently been put on Eliquis.  Venous Dopplers done a month before showed no evidence of DVT.  Since I saw her 3 months ago she continues to remain stable.  Her shortness of breath is somewhat improved on Eliquis.  She does have swelling  in her right upper extremity for unclear reasons as well as some mild lower extremity edema.   Current Meds  Medication Sig   albuterol (PROVENTIL) (2.5 MG/3ML) 0.083% nebulizer solution Take 3 mLs (2.5 mg total) by nebulization every 6 (six) hours as needed for wheezing.   allopurinol (ZYLOPRIM) 300 MG tablet Take 1 tablet (300 mg total) by mouth daily.   apixaban (ELIQUIS) 5 MG TABS tablet Take 1 tablet (5  mg total) by mouth 2 (two) times daily.   Artificial Tear Ointment (DRY EYES OP) Place 1 drop into both eyes 2 (two) times daily as needed (dry eyes).   atorvastatin (LIPITOR) 20 MG tablet Take 0.5 tablets (10 mg total) by mouth every evening. (Patient taking differently: Take 20 mg by mouth every evening.)   azithromycin (ZITHROMAX) 250 MG tablet Take 250 mg by mouth 3 (three) times a week.   Calcium Carb-Cholecalciferol (CALCIUM + D3 PO) Take 2 tablets by mouth daily with lunch.    Cholecalciferol (VITAMIN D-3) 25 MCG (1000 UT) CAPS Take 2 capsules (2,000 Units total) by mouth daily. GIVE ONE TAB PO Q DAY   diclofenac Sodium (VOLTAREN) 1 % GEL Apply 2 g topically 3 (three) times daily. Apply to both hands and knees   Dulaglutide (TRULICITY) 5.45 GY/5.6LS SOPN Inject 0.75 mg into the skin once a week.   DULoxetine (CYMBALTA) 60 MG capsule Take 1 capsule (60 mg total) by mouth daily.   fluticasone (FLONASE) 50 MCG/ACT nasal spray Place 2 sprays into both nostrils daily.   furosemide (LASIX) 80 MG tablet Take 1 tablet (80 mg total) by mouth daily.   gabapentin (NEURONTIN) 600 MG tablet Take 2 tablets (1,200 mg total) by mouth 3 (three) times daily.   guaiFENesin (MUCINEX) 600 MG 12 hr tablet Take 2 tablets (1,200 mg total) by mouth 2 (two) times daily.   hydroxychloroquine (PLAQUENIL) 200 MG tablet Take 1 tablet (200 mg total) by mouth daily.   leflunomide (ARAVA) 20 MG tablet Take 1 tablet (20 mg total) by mouth daily.   levothyroxine (SYNTHROID) 50 MCG tablet Take 1 tablet (50 mcg total) by mouth daily.   linaclotide (LINZESS) 72 MCG capsule Take 1 capsule (72 mcg total) by mouth daily before breakfast.   metoprolol succinate (TOPROL-XL) 25 MG 24 hr tablet Take 5 tablets (125 mg total) by mouth daily.   morphine (MS CONTIN) 15 MG 12 hr tablet Take 1 tablet (15 mg total) by mouth every 12 (twelve) hours. Do Not Fill Before 03/02/2022   Multiple Vitamin (MULTIVITAMIN WITH MINERALS) TABS Take 1  tablet by mouth daily.    Nintedanib (OFEV) 100 MG CAPS Take 1 capsule (100 mg total) by mouth 2 (two) times daily.   olmesartan (BENICAR) 40 MG tablet Take 1 tablet (40 mg total) by mouth daily.   OXYGEN-HELIUM IN Inhale 3 L into the lungs at bedtime. And on exertion   pantoprazole (PROTONIX) 40 MG tablet take 1 tablet by mouth once daily MINUTES BEFORE 1ST MEAL OF THE DAY   Potassium Chloride ER 20 MEQ TBCR Take 20 mEq by mouth daily.   predniSONE (DELTASONE) 10 MG tablet Take 1 tablet (10 mg total) by mouth daily with breakfast.   sodium chloride (OCEAN) 0.65 % SOLN nasal spray Place 1 spray into both nostrils as needed for congestion.     Allergies  Allergen Reactions   Codeine Swelling and Other (See Comments)    Facial swelling Chest pain and swelling in legs  Infliximab Anaphylaxis    ([place]) "sent me into shock"    Lisinopril Swelling    Face and neck swelling     Social History   Socioeconomic History   Marital status: Married    Spouse name: Not on file   Number of children: 6   Years of education: college   Highest education level: Not on file  Occupational History   Occupation: retired Marine scientist  Tobacco Use   Smoking status: Never   Smokeless tobacco: Never  Vaping Use   Vaping Use: Never used  Substance and Sexual Activity   Alcohol use: No   Drug use: No   Sexual activity: Not Currently  Other Topics Concern   Not on file  Social History Narrative   Patient consumes 2-3 cups coffee per day.   Social Determinants of Health   Financial Resource Strain: Low Risk  (10/23/2021)   Overall Financial Resource Strain (CARDIA)    Difficulty of Paying Living Expenses: Not hard at all  Food Insecurity: No Food Insecurity (10/23/2021)   Hunger Vital Sign    Worried About Running Out of Food in the Last Year: Never true    Ran Out of Food in the Last Year: Never true  Transportation Needs: No Transportation Needs (10/23/2021)   PRAPARE - Civil engineer, contracting (Medical): No    Lack of Transportation (Non-Medical): No  Physical Activity: Insufficiently Active (10/23/2021)   Exercise Vital Sign    Days of Exercise per Week: 2 days    Minutes of Exercise per Session: 40 min  Stress: No Stress Concern Present (10/18/2020)   [place]    Feeling of Stress : Not at all  Social Connections: [place] (10/23/2021)   Social Connection and Isolation Panel [NHANES]    Frequency of Communication with Friends and Family: More than three times a week    Frequency of Social Gatherings with Friends and Family: More than three times a week    Attends Religious Services: More than 4 times per year    Active Member of [employer] or Organizations: Yes    Attends Music therapist: More than 4 times per year    Marital Status: Married  Human resources officer Violence: Not At Risk (10/23/2021)   Humiliation, Afraid, Rape, and Kick questionnaire    Fear of Current or Ex-Partner: No    Emotionally Abused: No    Physically Abused: No    Sexually Abused: No     Review of Systems: General: negative for chills, fever, night sweats or weight changes.  Cardiovascular: negative for chest pain, dyspnea on exertion, edema, orthopnea, palpitations, paroxysmal nocturnal dyspnea or shortness of breath Dermatological: negative for rash Respiratory: negative for cough or wheezing Urologic: negative for hematuria Abdominal: negative for nausea, vomiting, diarrhea, bright red blood per rectum, melena, or hematemesis Neurologic: negative for visual changes, syncope, or dizziness All other systems reviewed and are otherwise negative except as noted above.    Blood pressure (!) 142/88, pulse (!) 101, height _0  (1.626 m), weight 222 lb (100.7 kg), SpO2 94 %.  General appearance: alert and no distress Neck: no adenopathy, no carotid bruit, no JVD, supple, symmetrical, trachea midline, and  thyroid not enlarged, symmetric, no tenderness/mass/nodules Lungs: clear to auscultation bilaterally Heart: regular rate and rhythm, S1, S2 normal, no murmur, click, rub or gallop Extremities: 1+ bilateral lower extremity edema Pulses: 2+ and symmetric Skin: Skin color, texture, turgor  normal. No rashes or lesions Neurologic: Grossly normal  EKG sinus rhythm at 99 with borderline LVH voltage and left axis deviation.  Personally reviewed this EKG.  ASSESSMENT AND PLAN:   Dyslipidemia History of dyslipidemia on atorvastatin with lipid profile performed 09/26/2020 revealing total cholesterol 136, LDL 75 and HDL 40.  Essential hypertension History of essential hypertension a blood pressure measured today at 142/88.  She says her blood blood pressure at home remains high on Benicar and Toprol.  I am going to add low-dose hydralazine 25 mg p.o. twice daily I will have her keep a blood pressure log for 30 days and see a Pharm.D. back in 4 weeks to review and make appropriate changes.  DOE (dyspnea on exertion) Probably multifactorial with history of obesity, and interstitial lung disease.  I did do a right heart cath on her 08/12/2021 revealing a pulmonary artery pressure of 43/19 with a mean of 38 mmHg and a wedge of 22.  She saw that was found to have multiple subsegmental pulmonary emboli in the right upper and middle lobes and is on Eliquis.  Her shortness of breath is somewhat improved.  Chronic diastolic CHF (congestive heart failure) (HCC) History of chronic diastolic heart failure on furosemide.     Lorretta Harp MD FACP,FACC,FAHA, [facility] 01/28/2022 3:06 PM

## 2022-01-28 NOTE — Addendum Note (Signed)
Addended by: Beatrix Fetters on: 01/28/2022 05:11 PM   Modules accepted: Orders

## 2022-01-28 NOTE — Progress Notes (Signed)
Subjective:    Patient ID: [name], female    DOB: 10-17-1940, 81 y.o.   MRN: [id number]  HPI: [name] is a 81 y.o. female who returns for follow up appointment for chronic pain and medication refill. She states her pain is located in her right shoulder, right hand pain with tingling and numbness, lower back pain radiating into her hips. And bilateral knee pain. She also reports generalized joint pain.She rates her pain 4. Her current exercise regime is walking with her walker.   [name] reports memory changes, referral placed to neurology.   [name] Morphine equivalent is 60.00 MME.   Last UDS was Performed on 09/17/2021, it was consistent.     Pain Inventory Average Pain 6 Pain Right Now 4 My pain is burning and dull  In the last 24 hours, has pain interfered with the following? General activity 5 Relation with others 6 Enjoyment of life 6 What TIME of day is your pain at its worst? morning  Sleep (in general) Fair  Pain is worse with: walking, bending, standing, and some activites Pain improves with: rest, heat/ice, pacing activities, and medication Relief from Meds: 5  Family History  Problem Relation Age of Onset   Diabetes Mother    Heart attack Mother    Hypertension Father    Lung cancer Father    Diabetes Sister    Breast cancer Sister    Breast cancer Sister    Diabetes Brother    Hypertension Brother    Heart disease Brother    Heart attack Brother    Kidney cancer Brother    Gout Brother    Kidney failure Brother        x 5   Esophageal cancer Brother    Stomach cancer Brother    Prostate cancer Brother    Uterine cancer Daughter    Hypertension Daughter    Hypertension Daughter    Rheum arthritis Maternal Uncle    Hypertension Son    Heart murmur Son    Social History   Socioeconomic History   Marital status: Married    Spouse name: Not on file   Number of children: 6   Years of education: college   Highest  education level: Not on file  Occupational History   Occupation: retired Marine scientist  Tobacco Use   Smoking status: Never   Smokeless tobacco: Never  Vaping Use   Vaping Use: Never used  Substance and Sexual Activity   Alcohol use: No   Drug use: No   Sexual activity: Not Currently  Other Topics Concern   Not on file  Social History Narrative   Patient consumes 2-3 cups coffee per day.   Social Determinants of Health   Financial Resource Strain: Low Risk  (10/23/2021)   Overall Financial Resource Strain (CARDIA)    Difficulty of Paying Living Expenses: Not hard at all  Food Insecurity: No Food Insecurity (10/23/2021)   Hunger Vital Sign    Worried About Running Out of Food in the Last Year: Never true    Ran Out of Food in the Last Year: Never true  Transportation Needs: No Transportation Needs (10/23/2021)   PRAPARE - Hydrologist (Medical): No    Lack of Transportation (Non-Medical): No  Physical Activity: Insufficiently Active (10/23/2021)   Exercise Vital Sign    Days of Exercise per Week: 2 days    Minutes of Exercise per Session: 40 min  Stress: No Stress Concern Present (10/18/2020)   [place]    Feeling of Stress : Not at all  Social Connections: [place] (10/23/2021)   Social Connection and Isolation Panel [NHANES]    Frequency of Communication with Friends and Family: More than three times a week    Frequency of Social Gatherings with Friends and Family: More than three times a week    Attends Religious Services: More than 4 times per year    Active Member of [employer] or Organizations: Yes    Attends Music therapist: More than 4 times per year    Marital Status: Married   Past Surgical History:  Procedure Laterality Date   ABDOMINAL HYSTERECTOMY     BRAIN SURGERY     Gamma knife 10/13. Needs repeat spring  '14   CARDIAC CATHETERIZATION N/A 10/31/2014    Procedure: Right/Left Heart Cath and Coronary Angiography;  Surgeon: [name], MD;  Location: [facility];  Service: Cardiovascular;  Laterality: N/A;   CYST EXCISION     2 on face   CYST REMOVAL NECK     ESOPHAGOGASTRODUODENOSCOPY (EGD) WITH PROPOFOL N/A 09/14/2014   Procedure: ESOPHAGOGASTRODUODENOSCOPY (EGD) WITH PROPOFOL;  Surgeon: [name], MD;  Location: [facility];  Service: Endoscopy;  Laterality: N/A;   INCISION AND DRAINAGE HIP Left 01/16/2017   Procedure: IRRIGATION AND DEBRIDEMENT LEFT HIP;  Surgeon: [name], MD;  Location: [facility];  Service: Orthopedics;  Laterality: Left;   INTRAMEDULLARY (IM) NAIL INTERTROCHANTERIC Left 11/29/2016   Procedure: INTRAMEDULLARY (IM) NAIL INTERTROCHANTRIC;  Surgeon: [name], MD;  Location: [facility];  Service: Orthopedics;  Laterality: Left;   OVARY SURGERY     RIGHT HEART CATH N/A 08/12/2021   Procedure: RIGHT HEART CATH;  Surgeon: Lorretta Harp, MD;  Location: [facility];  Service: Cardiovascular;  Laterality: N/A;   SHOULDER SURGERY Left    TONSILLECTOMY  age 62   VIDEO BRONCHOSCOPY Bilateral 05/31/2013   Procedure: VIDEO BRONCHOSCOPY WITHOUT FLUORO;  Surgeon: Brand Males, MD;  Location: [facility];  Service: Cardiopulmonary;  Laterality: Bilateral;   video bronscoscopy  05/19/1998   lung   Past Surgical History:  Procedure Laterality Date   ABDOMINAL HYSTERECTOMY     BRAIN SURGERY     Gamma knife 10/13. Needs repeat spring  '14   CARDIAC CATHETERIZATION N/A 10/31/2014   Procedure: Right/Left Heart Cath and Coronary Angiography;  Surgeon: [name], MD;  Location: [facility];  Service: Cardiovascular;  Laterality: N/A;   CYST EXCISION     2 on face   CYST REMOVAL NECK     ESOPHAGOGASTRODUODENOSCOPY (EGD) WITH PROPOFOL N/A 09/14/2014   Procedure: ESOPHAGOGASTRODUODENOSCOPY (EGD) WITH PROPOFOL;  Surgeon: [name], MD;  [facility];   Service: Endoscopy;  Laterality: N/A;   INCISION AND DRAINAGE HIP Left 01/16/2017   Procedure: IRRIGATION AND DEBRIDEMENT LEFT HIP;  Surgeon: [name], MD;  [facility];  Service: Orthopedics;  Laterality: Left;   INTRAMEDULLARY (IM) NAIL INTERTROCHANTERIC Left 11/29/2016   Procedure: INTRAMEDULLARY (IM) NAIL INTERTROCHANTRIC;  Surgeon: [name], MD;  Location: [facility];  Service: Orthopedics;  Laterality: Left;   OVARY SURGERY     RIGHT HEART CATH N/A 08/12/2021   Procedure: RIGHT HEART CATH;  Surgeon: Lorretta Harp, MD;  Location: [facility];  Service: Cardiovascular;  Laterality: N/A;   SHOULDER SURGERY Left  TONSILLECTOMY  age 37   VIDEO BRONCHOSCOPY Bilateral 05/31/2013   Procedure: VIDEO BRONCHOSCOPY WITHOUT FLUORO;  Surgeon: Brand Males, MD;  Location: [facility];  Service: Cardiopulmonary;  Laterality: Bilateral;   video bronscoscopy  05/19/1998   lung   Past Medical History:  Diagnosis Date   Acute on chronic diastolic (congestive) heart failure (HCC)    Anemia    iron deficiency anemia - secondary to blood loss ( chronic)    Anxiety    Arthritis    endstage changes bilateral knees/bilateral ankles.    Asthma    Carotid artery occlusion    Chronic fatigue    Chronic kidney disease    Closed left hip fracture (HCC)    Clotting disorder ([place])    pt denies this   Contusion of left knee    due to fall 1/14.   COPD (chronic obstructive pulmonary disease) (HCC)    pulmonary fibrosis   Depression, reactive    Diabetes mellitus    type II    Diastolic dysfunction    Difficulty in walking    Family history of heart disease    Generalized muscle weakness    Gout    High cholesterol    History of falling    Hypertension    Hypothyroidism    Interstitial lung disease ([place])    Meningioma of left sphenoid wing involving cavernous sinus ([place]) 02/17/2012   Continue diplopia, left eye pain and left headaches.     Morbid  obesity ([place])    Neuromuscular disorder ([place])    diabetic neuropathy    Normal coronary arteries    cardiac catheterization performed  10/31/14   Pneumonia    RA (rheumatoid arthritis) ([place])    has been off methotreaxte since 10/13.   Spinal stenosis of lumbar region    Thyroid disease    Unspecified lack of coordination    URI (upper respiratory infection)    BP (!) 149/92   Pulse 96   Ht _0  (1.626 m)   Wt 221 lb (100.2 kg)   SpO2 92%   BMI 37.93 kg/m   Opioid Risk Score:   Fall Risk Score:  `1  Depression screen PHQ 2/9     12/20/2021    3:26 PM 12/09/2021    1:12 PM 12/02/2021   11:22 AM 10/23/2021   11:52 AM 09/25/2021    3:21 PM 08/19/2021   10:55 AM 07/22/2021   11:04 AM  Depression screen PHQ 2/9  Decreased Interest 2 2 0 0 1 2 0  Down, Depressed, Hopeless 2 2 0 0 1 1 0  PHQ - 2 Score 4 4 0 0 2 3 0  Altered sleeping 1 1  0 0 1   Tired, decreased energy 1 2  0 2 1   Change in appetite 1 1  0 1    Feeling bad or failure about yourself  2 2  0 1 1   Trouble concentrating 2 1  0 1 1   Moving slowly or fidgety/restless 2 2  0 2 2   Suicidal thoughts 0 1  0 0 0   PHQ-9 Score 13 14  0 9 9   Difficult doing work/chores Very difficult Extremely dIfficult    Extremely dIfficult      Review of Systems  Musculoskeletal:  Positive for back pain.       Right shoulder pain Bilateral thigh pain  All other systems reviewed and are negative.  Objective:   Physical Exam Vitals and nursing note reviewed.  Constitutional:      Appearance: Normal appearance. She is obese.  Cardiovascular:     Rate and Rhythm: Normal rate and regular rhythm.     Pulses: Normal pulses.     Heart sounds: Normal heart sounds.  Pulmonary:     Effort: Pulmonary effort is normal.     Breath sounds: Normal breath sounds.  Musculoskeletal:     Cervical back: Normal range of motion and neck supple.     Comments: Normal Muscle Bulk and Muscle Testing Reveals:  Upper Extremities: Right:  Decreased ROM 45 Degrees and Muscle Strength 5/5 Left Upper Extremity: Full ROM and Muscle Strength 5/5 Bilateral AC Joint Tenderness Lumbar Hypersensitivity Bilateral Greater Trochanter Tenderness Lower Extremities: Full ROM and Muscle Strength 5/5 Arises from Table slowly using walker for support Antalgic Gait     Skin:    General: Skin is warm and dry.  Neurological:     Mental Status: She is alert and oriented to person, place, and time.  Psychiatric:        Mood and Affect: Mood normal.        Behavior: Behavior normal.         Assessment & Plan:  1. Lumbar spinal stenosis with neurogenic claudication. Associated facet arthropathy/ Lumbar Radiculitis: Continue current medication regime with Gabapentin and HEP as tolerated. 01/28/2022 Refilled: MS Contin 15 mg one tablet every 12 hours #60 and  MSIR 15 one tablet twice a day as needed  For break through pain ( 6 am and 1:00 pm)  #60.since adverse effect with insomnia with Nucynta. Nucynta discontinued due to insomnia.  We will continue the opioid monitoring program, this consists of regular clinic visits, examinations, urine drug screen, pill counts as well as use of [place] Controlled Substance Reporting system. A 12 month History has been reviewed on the [place] Controlled Substance Reporting System 01/28/2022 2. Chronic Bilateral Thoracic Back Pain: Continue HEP as Tolerated. Continue to Monitor. 01/28/2022 3 Depression: Continue current medication Regime: Cymbalta . Continue to monitor.01/28/2022 4. Diabetes mellitus type 2 with polyneuropathy: Continue current medication regime: Gabapentin. 01/28/2022 5. Rheumatoid arthritis and osteoarthritis. Continue current medication Regime.  Voltaren Gel. Rheumatology Following. 01/28/2022 6. Interstitial lung disease: Pulmonology Following. 01/28/2022. 7. Bilateral Osteoarthritis Knee's:  Continue current medication regimen. Voltaren Gel. 01/28/2022. 8. Muscle Spasm:  Continue current medication regime : Flexeril. Continue to monitor.01/28/2022 9. Bilateral Greater Trochanteric Tenderness: L>R. Continue current treatment with Ice/Heat Therapy. 01/28/2022. 10. Polyarthralgia: Rheumatology Following: Continue to monitor.01/28/2022 11. Morbid Obesitity: Continue Healthy Diet Regime and HEP. Continue to monitor.  01/28/2022 12. Cervicalgia/Cervical Radiculitis: No complaints today. Continue to Alternate with Heat and Ice Therapy. Continue to monitor. 01/28/2022  13. Memory Changes: RX: Referral Neurology.    F/U in 2 months

## 2022-01-28 NOTE — Patient Instructions (Signed)
Medication Instructions:   -Start taking hydralazine (apresoline) 9m twice daily.  *If you need a refill on your cardiac medications before your next appointment, please call your pharmacy*   Follow-Up: [facility] and your health needs are our priority.  As part of our continuing mission to provide you with exceptional heart care, we have created designated Provider Care Teams.  These Care Teams include your primary Cardiologist (physician) and Advanced Practice Providers (APPs -  Physician Assistants and Nurse Practitioners) who all work together to provide you with the care you need, when you need it.  We recommend signing up for the patient portal called "MyChart".  Sign up information is provided on this After Visit Summary.  MyChart is used to connect with patients for Virtual Visits (Telemedicine).  Patients are able to view lab/test results, encounter notes, upcoming appointments, etc.  Non-urgent messages can be sent to your provider as well.   To learn more about what you can do with MyChart, go to [email or web address]    Your next appointment:   6 month(s)  The format for your next appointment:   In Person  Provider:   AFabian Sharp PA-C, CSande Rives PA-C, KJory Sims DNP, ANP, HAlmyra Deforest PA-C, or EDiona Browner NP        Then, JQuay Burow MD will plan to see you again in 12 month(s).   Other Instructions Dr. BGwenlyn Foundhas requested that you schedule an appointment with one of our clinical pharmacists for a blood pressure check appointment within the next 4 weeks.  If you monitor your blood pressure (BP) at home, please bring your BP cuff and your BP readings with you to this appointment  HOW TO TAKE YOUR BLOOD PRESSURE: Rest 5 minutes before taking your blood pressure. Don't smoke or drink caffeinated beverages for at least 30 minutes before. Take your blood pressure before (not after) you eat. Sit comfortably with your back supported and  both feet on the floor (don't cross your legs). Elevate your arm to heart level on a table or a desk. Use the proper sized cuff. It should fit smoothly and snugly around your bare upper arm. There should be enough room to slip a fingertip under the cuff. The bottom edge of the cuff should be 1 inch above the crease of the elbow. Ideally, take 3 measurements at one sitting and record the average.

## 2022-01-28 NOTE — Assessment & Plan Note (Signed)
Probably multifactorial with history of obesity, and interstitial lung disease.  I did do a right heart cath on her 08/12/2021 revealing a pulmonary artery pressure of 43/19 with a mean of 38 mmHg and a wedge of 22.  She saw that was found to have multiple subsegmental pulmonary emboli in the right upper and middle lobes and is on Eliquis.  Her shortness of breath is somewhat improved.

## 2022-02-01 ENCOUNTER — Encounter (HOSPITAL_BASED_OUTPATIENT_CLINIC_OR_DEPARTMENT_OTHER): Payer: Self-pay

## 2022-02-01 ENCOUNTER — Other Ambulatory Visit: Payer: Self-pay

## 2022-02-01 ENCOUNTER — Emergency Department (HOSPITAL_COMMUNITY): Payer: Medicare Other

## 2022-02-01 ENCOUNTER — Emergency Department (HOSPITAL_BASED_OUTPATIENT_CLINIC_OR_DEPARTMENT_OTHER)
Admission: EM | Admit: 2022-02-01 | Discharge: 2022-02-02 | Disposition: A | Discharge status: Home / Self Care | Payer: Medicare Other | Attending: Emergency Medicine | Admitting: Emergency Medicine

## 2022-02-01 ENCOUNTER — Emergency Department (HOSPITAL_BASED_OUTPATIENT_CLINIC_OR_DEPARTMENT_OTHER): Payer: Medicare Other

## 2022-02-01 DIAGNOSIS — G459 Transient cerebral ischemic attack, unspecified: Secondary | ICD-10-CM | POA: Diagnosis not present

## 2022-02-01 DIAGNOSIS — Z20822 Contact with and (suspected) exposure to covid-19: Secondary | ICD-10-CM | POA: Insufficient documentation

## 2022-02-01 DIAGNOSIS — R519 Headache, unspecified: Secondary | ICD-10-CM | POA: Insufficient documentation

## 2022-02-01 DIAGNOSIS — S62646A Nondisplaced fracture of proximal phalanx of right little finger, initial encounter for closed fracture: Secondary | ICD-10-CM | POA: Insufficient documentation

## 2022-02-01 DIAGNOSIS — Z7901 Long term (current) use of anticoagulants: Secondary | ICD-10-CM | POA: Insufficient documentation

## 2022-02-01 DIAGNOSIS — M7989 Other specified soft tissue disorders: Secondary | ICD-10-CM | POA: Diagnosis not present

## 2022-02-01 DIAGNOSIS — Z043 Encounter for examination and observation following other accident: Secondary | ICD-10-CM | POA: Diagnosis not present

## 2022-02-01 DIAGNOSIS — E119 Type 2 diabetes mellitus without complications: Secondary | ICD-10-CM | POA: Diagnosis not present

## 2022-02-01 DIAGNOSIS — N3000 Acute cystitis without hematuria: Secondary | ICD-10-CM

## 2022-02-01 DIAGNOSIS — Z794 Long term (current) use of insulin: Secondary | ICD-10-CM | POA: Insufficient documentation

## 2022-02-01 DIAGNOSIS — R4781 Slurred speech: Secondary | ICD-10-CM | POA: Insufficient documentation

## 2022-02-01 DIAGNOSIS — R4789 Other speech disturbances: Secondary | ICD-10-CM

## 2022-02-01 DIAGNOSIS — S0990XA Unspecified injury of head, initial encounter: Secondary | ICD-10-CM | POA: Diagnosis not present

## 2022-02-01 DIAGNOSIS — W19XXXA Unspecified fall, initial encounter: Secondary | ICD-10-CM | POA: Diagnosis not present

## 2022-02-01 DIAGNOSIS — I10 Essential (primary) hypertension: Secondary | ICD-10-CM | POA: Insufficient documentation

## 2022-02-01 DIAGNOSIS — N390 Urinary tract infection, site not specified: Secondary | ICD-10-CM | POA: Insufficient documentation

## 2022-02-01 DIAGNOSIS — Z79899 Other long term (current) drug therapy: Secondary | ICD-10-CM | POA: Insufficient documentation

## 2022-02-01 LAB — COMPREHENSIVE METABOLIC PANEL
ALT: 20 U/L (ref 0–44)
AST: 15 U/L (ref 15–41)
Albumin: 3.7 g/dL (ref 3.5–5.0)
Alkaline Phosphatase: 86 U/L (ref 38–126)
Anion gap: 8 (ref 5–15)
BUN: 17 mg/dL (ref 8–23)
CO2: 34 mmol/L — ABNORMAL HIGH (ref 22–32)
Calcium: 9.3 mg/dL (ref 8.9–10.3)
Chloride: 99 mmol/L (ref 98–111)
Creatinine, Ser: 0.97 mg/dL (ref 0.44–1.00)
GFR, Estimated: 59 mL/min — ABNORMAL LOW (ref >60)
Glucose, Bld: 150 mg/dL — ABNORMAL HIGH (ref 70–99)
Potassium: 3.4 mmol/L — ABNORMAL LOW (ref 3.5–5.1)
Sodium: 141 mmol/L (ref 135–145)
Total Bilirubin: 0.8 mg/dL (ref 0.3–1.2)
Total Protein: 5.8 g/dL — ABNORMAL LOW (ref 6.5–8.1)

## 2022-02-01 LAB — DIFFERENTIAL
Abs Immature Granulocytes: 0.03 10*3/uL (ref 0.00–0.07)
Basophils Absolute: 0.1 10*3/uL (ref 0.0–0.1)
Basophils Relative: 1 %
Eosinophils Absolute: 0.3 10*3/uL (ref 0.0–0.5)
Eosinophils Relative: 3 %
Immature Granulocytes: 0 %
Lymphocytes Relative: 32 %
Lymphs Abs: 2.5 10*3/uL (ref 0.7–4.0)
Monocytes Absolute: 0.8 10*3/uL (ref 0.1–1.0)
Monocytes Relative: 10 %
Neutro Abs: 4.3 10*3/uL (ref 1.7–7.7)
Neutrophils Relative %: 54 %

## 2022-02-01 LAB — SARS CORONAVIRUS 2 BY RT PCR: SARS Coronavirus 2 by RT PCR: NEGATIVE

## 2022-02-01 LAB — URINALYSIS, ROUTINE W REFLEX MICROSCOPIC
Bilirubin Urine: NEGATIVE
Glucose, UA: NEGATIVE mg/dL
Hgb urine dipstick: NEGATIVE
Ketones, ur: NEGATIVE mg/dL
Nitrite: POSITIVE — AB
Protein, ur: NEGATIVE mg/dL
Specific Gravity, Urine: 1.014 (ref 1.005–1.030)
pH: 5 (ref 5.0–8.0)

## 2022-02-01 LAB — CBC
HCT: 43.7 % (ref 36.0–46.0)
Hemoglobin: 13.3 g/dL (ref 12.0–15.0)
MCH: 30.2 pg (ref 26.0–34.0)
MCHC: 30.4 g/dL (ref 30.0–36.0)
MCV: 99.1 fL (ref 80.0–100.0)
Platelets: 148 10*3/uL — ABNORMAL LOW (ref 150–400)
RBC: 4.41 MIL/uL (ref 3.87–5.11)
RDW: 13.4 % (ref 11.5–15.5)
WBC: 8 10*3/uL (ref 4.0–10.5)
nRBC: 0 % (ref 0.0–0.2)

## 2022-02-01 LAB — PROTIME-INR
INR: 1.2 (ref 0.8–1.2)
Prothrombin Time: 14.8 seconds (ref 11.4–15.2)

## 2022-02-01 LAB — APTT: aPTT: 31 seconds (ref 24–36)

## 2022-02-01 LAB — ETHANOL: Alcohol, Ethyl (B): <10 mg/dL (ref <10)

## 2022-02-01 MED ORDER — SODIUM CHLORIDE 0.9% FLUSH
3.0000 mL | Freq: Once | INTRAVENOUS | Status: AC
  Administered 2022-02-01: 3 mL via INTRAVENOUS
  Filled 2022-02-01: qty 3

## 2022-02-01 MED ORDER — GABAPENTIN 600 MG PO TABS
1200.0000 mg | ORAL_TABLET | Freq: Three times a day (TID) | ORAL | Status: DC
  Administered 2022-02-01: 1200 mg via ORAL
  Filled 2022-02-01: qty 2

## 2022-02-01 MED ORDER — METOPROLOL SUCCINATE ER 25 MG PO TB24: 125.0000 mg | ORAL_TABLET | Freq: Every day | ORAL | Status: DC

## 2022-02-01 MED ORDER — HYDRALAZINE HCL 25 MG PO TABS
25.0000 mg | ORAL_TABLET | Freq: Two times a day (BID) | ORAL | Status: DC
  Administered 2022-02-01: 25 mg via ORAL
  Filled 2022-02-01: qty 1

## 2022-02-01 MED ORDER — MORPHINE SULFATE ER 15 MG PO TBCR
15.0000 mg | EXTENDED_RELEASE_TABLET | Freq: Two times a day (BID) | ORAL | Status: DC
  Administered 2022-02-01: 15 mg via ORAL
  Filled 2022-02-01 (×2): qty 1

## 2022-02-01 NOTE — ED Notes (Signed)
Mri will be calling for this pt soon

## 2022-02-01 NOTE — ED Notes (Signed)
Ambulated pt to bathroom with walker, pt had slow gait with slight limp and complained of pain

## 2022-02-01 NOTE — ED Provider Notes (Signed)
[facility] Provider Note   CSN: [id number] Arrival date & time: 02/01/22  1450     History  CC: Speaking nonsense   [name] is a 81 y.o. female w/ hx of cavernous sinus meningioma s/p resection in 2013, obesity, HLD, HTN, on eliquis, presenting to ED with difficulty speaking, now resolved.  Patient was having word finding difficulties around noon today.  Her daughter at the bedside confirms, the patient been talking on the phone and then to the daughter, and was answering nonsensically, with mostly word salad, saying things like "the pain the change the same" when her daughter was speaking to her.  There is no actual expressive aphasia.  The patient reports she did have a headache yesterday evening which was behind her left eye and in her left temple.  She does not get headaches frequently but they often occur in the same distribution.  She reports she woke up this morning feeling "unwell" but says this is a feeling she has many mornings.  Per outpatient record review she also is a history of multiple subsegmental pulmonary embolisms and is on Eliquis for this.  HPI     Home Medications Prior to Admission medications   Medication Sig Start Date End Date Taking? Authorizing Provider  acetaminophen (TYLENOL) 500 MG tablet Take 500-1,000 mg by mouth every 6 (six) hours as needed for moderate pain. Patient not taking: Reported on 01/28/2022    [provider]  albuterol (PROVENTIL) (2.5 MG/3ML) 0.083% nebulizer solution Take 3 mLs (2.5 mg total) by nebulization every 6 (six) hours as needed for wheezing. 05/03/21   Medina-Vargas, Monina C, NP  allopurinol (ZYLOPRIM) 300 MG tablet Take 1 tablet (300 mg total) by mouth daily. 05/03/21   Medina-Vargas, Monina C, NP  apixaban (ELIQUIS) 5 MG TABS tablet Take 1 tablet (5 mg total) by mouth 2 (two) times daily. 11/27/21   Lorretta Harp, MD  Artificial Tear Ointment (DRY EYES OP) Place 1 drop into  both eyes 2 (two) times daily as needed (dry eyes).    [provider]  atorvastatin (LIPITOR) 20 MG tablet Take 0.5 tablets (10 mg total) by mouth every evening. Patient taking differently: Take 20 mg by mouth every evening. 12/09/21   [name], MD  azithromycin (ZITHROMAX) 250 MG tablet Take 250 mg by mouth 3 (three) times a week. 11/23/21   [provider]  Calcium Carb-Cholecalciferol (CALCIUM + D3 PO) Take 2 tablets by mouth daily with lunch.     [provider]  Cholecalciferol (VITAMIN D-3) 25 MCG (1000 UT) CAPS Take 2 capsules (2,000 Units total) by mouth daily. GIVE ONE TAB PO Q DAY 05/03/21   Medina-Vargas, Monina C, NP  colchicine 0.6 MG tablet Take 1 tablet (0.6 mg total) by mouth daily as needed (gout flares). Patient not taking: Reported on 01/28/2022 05/03/21   Medina-Vargas, Monina C, NP  cyclobenzaprine (FLEXERIL) 5 MG tablet Take 1 tablet (5 mg total) by mouth at bedtime as needed for muscle spasms. Patient not taking: Reported on 01/28/2022 12/02/21   [name], NP  diclofenac Sodium (VOLTAREN) 1 % GEL Apply 2 g topically 3 (three) times daily. Apply to both hands and knees 05/03/21   Medina-Vargas, Monina C, NP  Dulaglutide (TRULICITY) 0.93 GH/8.2XH SOPN Inject 0.75 mg into the skin once a week. 12/09/21   [name], MD  DULoxetine (CYMBALTA) 60 MG capsule Take 1 capsule (60 mg total) by mouth daily. 12/02/21  [name], NP  fluticasone (FLONASE) 50 MCG/ACT nasal spray Place 2 sprays into both nostrils daily. 12/09/21   [name], MD  furosemide (LASIX) 80 MG tablet Take 1 tablet (80 mg total) by mouth daily. 08/02/21   Lorretta Harp, MD  gabapentin (NEURONTIN) 600 MG tablet Take 2 tablets (1,200 mg total) by mouth 3 (three) times daily. 12/02/21   [name], NP  guaiFENesin (MUCINEX) 600 MG 12 hr tablet Take 2 tablets (1,200 mg total) by mouth 2 (two) times daily. 05/03/21   Medina-Vargas, Monina C, NP  hydrALAZINE  (APRESOLINE) 25 MG tablet Take 1 tablet (25 mg total) by mouth 2 (two) times daily. 01/28/22   Lorretta Harp, MD  hydrALAZINE (APRESOLINE) 25 MG tablet Take 1 tablet (25 mg total) by mouth 2 (two) times daily. 01/28/22   Lorretta Harp, MD  hydroxychloroquine (PLAQUENIL) 200 MG tablet Take 1 tablet (200 mg total) by mouth daily. 05/03/21   Medina-Vargas, Monina C, NP  leflunomide (ARAVA) 20 MG tablet Take 1 tablet (20 mg total) by mouth daily. 05/03/21 08/09/23  Medina-Vargas, Monina C, NP  levothyroxine (SYNTHROID) 50 MCG tablet Take 1 tablet (50 mcg total) by mouth daily. 09/25/21   [name], MD  linaclotide Rolan Lipa) 72 MCG capsule Take 1 capsule (72 mcg total) by mouth daily before breakfast. 12/09/21   [name], MD  metolazone (ZAROXOLYN) 2.5 MG tablet TAKE 1 TABLET BY MOUTH DAILY AS NEEDED FOR 2 DAYS FOR WEIGHT ABOVE 255 POUNDS Patient not taking: Reported on 01/28/2022 05/03/21   Medina-Vargas, Monina C, NP  metoprolol succinate (TOPROL-XL) 25 MG 24 hr tablet Take 5 tablets (125 mg total) by mouth daily. 12/09/21   [name], MD  morphine (MS CONTIN) 15 MG 12 hr tablet Take 1 tablet (15 mg total) by mouth every 12 (twelve) hours. Do Not Fill Before 03/02/2022 01/28/22   [name], NP  morphine (MSIR) 15 MG tablet Take 1 tablet (15 mg total) by mouth 2 (two) times daily. Patient not taking: Reported on 01/28/2022 01/28/22   [name], NP  Multiple Vitamin (MULTIVITAMIN WITH MINERALS) TABS Take 1 tablet by mouth daily.     [provider]  Nintedanib (OFEV) 100 MG CAPS Take 1 capsule (100 mg total) by mouth 2 (two) times daily. 09/18/21   Brand Males, MD  olmesartan (BENICAR) 40 MG tablet Take 1 tablet (40 mg total) by mouth daily. 12/09/21   [name], MD  OXYGEN-HELIUM IN Inhale 3 L into the lungs at bedtime. And on exertion    [provider]  pantoprazole (PROTONIX) 40 MG tablet take 1 tablet by mouth once daily MINUTES BEFORE 1ST  MEAL OF THE DAY 09/25/21   [name], MD  Potassium Chloride ER 20 MEQ TBCR Take 20 mEq by mouth daily. 01/08/22   [name], MD  predniSONE (DELTASONE) 10 MG tablet Take 1 tablet (10 mg total) by mouth daily with breakfast. 05/22/21   [name], MD  sodium chloride (OCEAN) 0.65 % SOLN nasal spray Place 1 spray into both nostrils as needed for congestion.    [provider]      Allergies    Codeine, Infliximab, and Lisinopril    Review of Systems   Review of Systems  Physical Exam Updated Vital Signs BP (!) 137/98   Pulse (!) 109   Temp (!) 97.4 F (36.3 C) (Oral)   Resp (!) 23  Ht _0  (1.626 m)   Wt 100.7 kg   SpO2 100%   BMI 38.11 kg/m  Physical Exam Constitutional:      General: She is not in acute distress. HENT:     Head: Normocephalic and atraumatic.  Eyes:     Conjunctiva/sclera: Conjunctivae normal.     Pupils: Pupils are equal, round, and reactive to light.  Cardiovascular:     Rate and Rhythm: Normal rate and regular rhythm.  Pulmonary:     Effort: Pulmonary effort is normal. No respiratory distress.  Abdominal:     General: There is no distension.     Tenderness: There is no abdominal tenderness.  Skin:    General: Skin is warm and dry.  Neurological:     General: No focal deficit present.     Mental Status: She is alert and oriented to person, place, and time. Mental status is at baseline.     Cranial Nerves: No cranial nerve deficit.     Sensory: No sensory deficit.     Motor: No weakness.  Psychiatric:        Mood and Affect: Mood normal.        Behavior: Behavior normal.     ED Results / Procedures / Treatments   Labs (all labs ordered are listed, but only abnormal results are displayed) Labs Reviewed  CBC - Abnormal; Notable for the following components:      Result Value   Platelets 148 (*)    All other components within normal limits  COMPREHENSIVE METABOLIC PANEL - Abnormal; Notable for the following  components:   Potassium 3.4 (*)    CO2 34 (*)    Glucose, Bld 150 (*)    Total Protein 5.8 (*)    GFR, Estimated 59 (*)    All other components within normal limits  SARS CORONAVIRUS 2 BY RT PCR  PROTIME-INR  APTT  DIFFERENTIAL  ETHANOL  URINALYSIS, ROUTINE W REFLEX MICROSCOPIC  CBG MONITORING, ED    EKG EKG Interpretation  Date/Time:  Saturday February 01 2022 15:00:14 EDT Ventricular Rate:  111 PR Interval:  128 QRS Duration: 82 QT Interval:  352 QTC Calculation: 478 R Axis:   -54 Text Interpretation: Sinus tachycardia Left anterior fascicular block Abnormal ECG When compared with ECG of 30-Mar-2021 12:07, PREVIOUS ECG IS PRESENT No STEMI Confirmed by Octaviano Glow [phone]) on 02/01/2022 3:28:13 PM  Radiology CT HEAD WO CONTRAST  Result Date: 02/01/2022 CLINICAL DATA:  TIA, symptoms resolved EXAM: CT HEAD WITHOUT CONTRAST TECHNIQUE: Contiguous axial images were obtained from the base of the skull through the vertex without intravenous contrast. RADIATION DOSE REDUCTION: This exam was performed according to the departmental dose-optimization program which includes automated exposure control, adjustment of the mA and/or kV according to patient size and/or use of iterative reconstruction technique. COMPARISON:  04/29/2021 FINDINGS: Brain: No evidence of acute infarction, hemorrhage, hydrocephalus, extra-axial collection or mass lesion/mass effect. Periventricular and deep white matter hypodensity. Vascular: No hyperdense vessel or unexpected calcification. Skull: Normal. Negative for fracture or focal lesion. Sinuses/Orbits: No acute finding. Other: None. IMPRESSION: No acute intracranial pathology. Small-vessel white matter disease. Consider MRI to more sensitively evaluate for acute diffusion restricting infarction if suspected. Electronically Signed   By: Delanna Ahmadi M.D.   On: 02/01/2022 15:45    Procedures Procedures    Medications Ordered in ED Medications  sodium  chloride flush (NS) 0.9 % injection 3 mL (3 mLs Intravenous Given 02/01/22 1550)    ED Course/  Medical Decision Making/ A&P Clinical Course as of 02/01/22 1823  Sat Feb 01, 2022  1807 Patient remains asymptomatic at this time.  She was excepted for transfer to [facility] ED by Dr Kathrynn Humble.  She will require ambulance transport as she is on her "as needed" home O2.  MRI was ordered.  Confirmed that the patient can tolerate MRI without needing medications, and that she has had MRIs in the past without issue.  If her MRI scan is unremarkable and she remains asymptomatic, anticipate she can be discharged home with her family.  The patient's daughter and niece are both present in agreement with the plan. [MT]    Clinical Course User Index [MT] Talis Iwan, Carola Rhine, MD                           Medical Decision Making Amount and/or Complexity of Data Reviewed Labs: ordered. Radiology: ordered.   This patient presents to the ED with concern for confusion, word salad. This involves an extensive number of treatment options, and is a complaint that carries with it a high risk of complications and morbidity.  The differential diagnosis includes TIA versus infection versus electrolyte derangement versus postsurgical edema of the brain versus complex migraine versus other  NIHSS 0 on arrival.  She is back to baseline per daughter.  Co-morbidities that complicate the patient evaluation: History of thrombosis, brain resection, at high risk for cerebrovascular accidents and cerebral edema  Additional history obtained from patient's daughter at bedside  External records from outside source obtained and reviewed including neurosurgical notes from 2013 and cardiology note from this year, confirming patient is on Eliquis.  I ordered and personally interpreted labs.  The pertinent results include: No emergent findings or acute abnormalities  I ordered imaging studies including CT of the head I independently  visualized and interpreted imaging which showed no acute abnormalities. I agree with the radiologist interpretation  The patient was maintained on a cardiac monitor.  I personally viewed and interpreted the cardiac monitored which showed an underlying rhythm of: Sinus tachycardia  Per my interpretation the patient's ECG shows sinus rhythm no significant changes from prior tracing, no acute ischemia   Test Considered: I have a low suspicion for meningitis or epilepsy.  Do not feel she needs EEG or emergent lumbar puncture at this time.   After the interventions noted above, I reevaluated the patient and found that they have: stayed the same  She has been asymptomatic throughout her observation stay in the ED  Dispostion:  After consideration of the diagnostic results and the patients response to treatment, I feel that the patent would benefit from transfer to [facility], ED for MRI of the brain to complete potential TIA evaluation.         Final Clinical Impression(s) / ED Diagnoses Final diagnoses:  Garbled speech    Rx / DC Orders ED Discharge Orders     None         Oliva Montecalvo, Carola Rhine, MD 02/01/22 [phone]

## 2022-02-01 NOTE — ED Triage Notes (Addendum)
At 1200, pt was unable to find words. Pt is now able to speak in full sentences. Stroke screen negative in triage.   Pt has hx of a brain tumor. Pt had surgery to remove tumor, but they were unable to get all of it.  Pt is on Eliquis

## 2022-02-02 ENCOUNTER — Emergency Department (HOSPITAL_COMMUNITY): Payer: Medicare Other

## 2022-02-02 ENCOUNTER — Encounter (HOSPITAL_COMMUNITY): Payer: Self-pay | Admitting: Emergency Medicine

## 2022-02-02 ENCOUNTER — Emergency Department (HOSPITAL_COMMUNITY)
Admission: EM | Admit: 2022-02-02 | Discharge: 2022-02-02 | Disposition: A | Discharge status: Home / Self Care | Payer: Medicare Other | Source: Home / Self Care | Attending: Emergency Medicine | Admitting: Emergency Medicine

## 2022-02-02 ENCOUNTER — Ambulatory Visit (HOSPITAL_COMMUNITY)
Admission: EM | Admit: 2022-02-02 | Discharge: 2022-02-02 | Disposition: A | Discharge status: Home / Self Care | Payer: Medicare Other

## 2022-02-02 ENCOUNTER — Encounter (HOSPITAL_COMMUNITY): Payer: Self-pay | Admitting: *Deleted

## 2022-02-02 ENCOUNTER — Other Ambulatory Visit: Payer: Self-pay

## 2022-02-02 DIAGNOSIS — I10 Essential (primary) hypertension: Secondary | ICD-10-CM | POA: Insufficient documentation

## 2022-02-02 DIAGNOSIS — S0083XA Contusion of other part of head, initial encounter: Secondary | ICD-10-CM

## 2022-02-02 DIAGNOSIS — S62646A Nondisplaced fracture of proximal phalanx of right little finger, initial encounter for closed fracture: Secondary | ICD-10-CM | POA: Insufficient documentation

## 2022-02-02 DIAGNOSIS — Z79899 Other long term (current) drug therapy: Secondary | ICD-10-CM | POA: Insufficient documentation

## 2022-02-02 DIAGNOSIS — W19XXXA Unspecified fall, initial encounter: Secondary | ICD-10-CM | POA: Insufficient documentation

## 2022-02-02 DIAGNOSIS — Z043 Encounter for examination and observation following other accident: Secondary | ICD-10-CM | POA: Diagnosis not present

## 2022-02-02 DIAGNOSIS — E119 Type 2 diabetes mellitus without complications: Secondary | ICD-10-CM | POA: Insufficient documentation

## 2022-02-02 DIAGNOSIS — R4781 Slurred speech: Secondary | ICD-10-CM | POA: Diagnosis not present

## 2022-02-02 DIAGNOSIS — S0990XA Unspecified injury of head, initial encounter: Secondary | ICD-10-CM | POA: Diagnosis not present

## 2022-02-02 DIAGNOSIS — M7989 Other specified soft tissue disorders: Secondary | ICD-10-CM | POA: Diagnosis not present

## 2022-02-02 DIAGNOSIS — Z7901 Long term (current) use of anticoagulants: Secondary | ICD-10-CM | POA: Insufficient documentation

## 2022-02-02 DIAGNOSIS — G459 Transient cerebral ischemic attack, unspecified: Secondary | ICD-10-CM | POA: Diagnosis not present

## 2022-02-02 LAB — BASIC METABOLIC PANEL
Anion gap: 10 (ref 5–15)
BUN: 13 mg/dL (ref 8–23)
CO2: 32 mmol/L (ref 22–32)
Calcium: 8.9 mg/dL (ref 8.9–10.3)
Chloride: 102 mmol/L (ref 98–111)
Creatinine, Ser: 0.93 mg/dL (ref 0.44–1.00)
GFR, Estimated: >60 mL/min (ref >60)
Glucose, Bld: 198 mg/dL — ABNORMAL HIGH (ref 70–99)
Potassium: 4 mmol/L (ref 3.5–5.1)
Sodium: 144 mmol/L (ref 135–145)

## 2022-02-02 LAB — CBC
HCT: 43 % (ref 36.0–46.0)
Hemoglobin: 13.5 g/dL (ref 12.0–15.0)
MCH: 31.2 pg (ref 26.0–34.0)
MCHC: 31.4 g/dL (ref 30.0–36.0)
MCV: 99.3 fL (ref 80.0–100.0)
Platelets: 149 10*3/uL — ABNORMAL LOW (ref 150–400)
RBC: 4.33 MIL/uL (ref 3.87–5.11)
RDW: 13.2 % (ref 11.5–15.5)
WBC: 7.5 10*3/uL (ref 4.0–10.5)
nRBC: 0 % (ref 0.0–0.2)

## 2022-02-02 MED ORDER — CEPHALEXIN 500 MG PO CAPS: 500.0000 mg | ORAL_CAPSULE | Freq: Three times a day (TID) | ORAL | 0 refills | Status: DC

## 2022-02-02 MED ORDER — CEPHALEXIN 250 MG PO CAPS: 500.0000 mg | ORAL_CAPSULE | Freq: Once | ORAL | Status: DC

## 2022-02-02 MED ORDER — CEPHALEXIN 250 MG PO CAPS
500.0000 mg | ORAL_CAPSULE | Freq: Once | ORAL | Status: AC
  Administered 2022-02-02: 500 mg via ORAL
  Filled 2022-02-02: qty 2

## 2022-02-02 MED ORDER — IOHEXOL 350 MG/ML SOLN
75.0000 mL | Freq: Once | INTRAVENOUS | Status: AC | PRN
  Administered 2022-02-02: 75 mL via INTRAVENOUS

## 2022-02-02 NOTE — Progress Notes (Signed)
Orthopedic Tech Progress Note Patient Details:  [name] Oct 27, 1940 [id number]  Ortho Devices Type of Ortho Device: Finger splint Ortho Device/Splint Location: rue 5th finger Ortho Device/Splint Interventions: Ordered, Adjustment, Application   Post Interventions Patient Tolerated: Well Instructions Provided: Care of device, Adjustment of device  Karolee Stamps 02/02/2022, 7:54 PM

## 2022-02-02 NOTE — ED Triage Notes (Signed)
PT reports falling today and hit her face on side of face. Pt reports she passed out.

## 2022-02-02 NOTE — ED Provider Notes (Signed)
[facility] EMERGENCY DEPARTMENT Provider Note   CSN: [id number] Arrival date & time: 02/02/22  1754     History  Chief Complaint  Patient presents with   Fall on thinners    [name] is a 81 y.o. female. Presenting after ground-level fall this morning.  Patient states she was walking to the bathroom and fell and hit her head on a jewelry box.  She denies preceding chest pain, dizziness, or lightheadedness, however she is not sure why she fell.  She hit her head on the left side and caught herself with her right arm.  She remembers the event.  No LOC.  It was witnessed by her husband, but he is not present to provide further history.  Has been ambulatory since then.  Use a walker at baseline.  She is on Eliquis.  HPI     Home Medications Prior to Admission medications   Medication Sig Start Date End Date Taking? Authorizing Provider  acetaminophen (TYLENOL) 500 MG tablet Take 500-1,000 mg by mouth every 6 (six) hours as needed for moderate pain. Patient not taking: Reported on 01/28/2022    [provider]  albuterol (PROVENTIL) (2.5 MG/3ML) 0.083% nebulizer solution Take 3 mLs (2.5 mg total) by nebulization every 6 (six) hours as needed for wheezing. 05/03/21   Medina-Vargas, Monina C, NP  allopurinol (ZYLOPRIM) 300 MG tablet Take 1 tablet (300 mg total) by mouth daily. 05/03/21   Medina-Vargas, Monina C, NP  apixaban (ELIQUIS) 5 MG TABS tablet Take 1 tablet (5 mg total) by mouth 2 (two) times daily. 11/27/21   Lorretta Harp, MD  Artificial Tear Ointment (DRY EYES OP) Place 1 drop into both eyes 2 (two) times daily as needed (dry eyes).    [provider]  atorvastatin (LIPITOR) 20 MG tablet Take 0.5 tablets (10 mg total) by mouth every evening. Patient taking differently: Take 20 mg by mouth every evening. 12/09/21   [name], MD  azithromycin (ZITHROMAX) 250 MG tablet Take 250 mg by mouth 3 (three) times a week. 11/23/21    [provider]  Calcium Carb-Cholecalciferol (CALCIUM + D3 PO) Take 2 tablets by mouth daily with lunch.     [provider]  cephALEXin (KEFLEX) 500 MG capsule Take 1 capsule (500 mg total) by mouth 3 (three) times daily. 02/02/22   Horton, Barbette Hair, MD  Cholecalciferol (VITAMIN D-3) 25 MCG (1000 UT) CAPS Take 2 capsules (2,000 Units total) by mouth daily. GIVE ONE TAB PO Q DAY 05/03/21   Medina-Vargas, Monina C, NP  colchicine 0.6 MG tablet Take 1 tablet (0.6 mg total) by mouth daily as needed (gout flares). Patient not taking: Reported on 01/28/2022 05/03/21   Medina-Vargas, Monina C, NP  cyclobenzaprine (FLEXERIL) 5 MG tablet Take 1 tablet (5 mg total) by mouth at bedtime as needed for muscle spasms. Patient not taking: Reported on 01/28/2022 12/02/21   [name], NP  diclofenac Sodium (VOLTAREN) 1 % GEL Apply 2 g topically 3 (three) times daily. Apply to both hands and knees 05/03/21   Medina-Vargas, Monina C, NP  Dulaglutide (TRULICITY) 1.09 NA/3.5TD SOPN Inject 0.75 mg into the skin once a week. 12/09/21   [name], MD  DULoxetine (CYMBALTA) 60 MG capsule Take 1 capsule (60 mg total) by mouth daily. 12/02/21   [name], NP  fluticasone (FLONASE) 50 MCG/ACT nasal spray Place 2 sprays into both nostrils daily. 12/09/21   [name], MD  furosemide (LASIX) 80 MG tablet Take 1 tablet (80 mg total) by mouth daily. 08/02/21   Lorretta Harp, MD  gabapentin (NEURONTIN) 600 MG tablet Take 2 tablets (1,200 mg total) by mouth 3 (three) times daily. 12/02/21   [name], NP  guaiFENesin (MUCINEX) 600 MG 12 hr tablet Take 2 tablets (1,200 mg total) by mouth 2 (two) times daily. 05/03/21   Medina-Vargas, Monina C, NP  hydrALAZINE (APRESOLINE) 25 MG tablet Take 1 tablet (25 mg total) by mouth 2 (two) times daily. 01/28/22   Lorretta Harp, MD  hydrALAZINE (APRESOLINE) 25 MG tablet Take 1 tablet (25 mg total) by mouth 2 (two) times daily. 01/28/22   Lorretta Harp, MD  hydroxychloroquine (PLAQUENIL) 200 MG tablet Take 1 tablet (200 mg total) by mouth daily. 05/03/21   Medina-Vargas, Monina C, NP  leflunomide (ARAVA) 20 MG tablet Take 1 tablet (20 mg total) by mouth daily. 05/03/21 08/09/23  Medina-Vargas, Monina C, NP  levothyroxine (SYNTHROID) 50 MCG tablet Take 1 tablet (50 mcg total) by mouth daily. 09/25/21   [name], MD  linaclotide Rolan Lipa) 72 MCG capsule Take 1 capsule (72 mcg total) by mouth daily before breakfast. 12/09/21   [name], MD  metolazone (ZAROXOLYN) 2.5 MG tablet TAKE 1 TABLET BY MOUTH DAILY AS NEEDED FOR 2 DAYS FOR WEIGHT ABOVE 255 POUNDS Patient not taking: Reported on 01/28/2022 05/03/21   Medina-Vargas, Monina C, NP  metoprolol succinate (TOPROL-XL) 25 MG 24 hr tablet Take 5 tablets (125 mg total) by mouth daily. 12/09/21   [name], MD  morphine (MS CONTIN) 15 MG 12 hr tablet Take 1 tablet (15 mg total) by mouth every 12 (twelve) hours. Do Not Fill Before 03/02/2022 01/28/22   [name], NP  morphine (MSIR) 15 MG tablet Take 1 tablet (15 mg total) by mouth 2 (two) times daily. Patient not taking: Reported on 01/28/2022 01/28/22   [name], NP  Multiple Vitamin (MULTIVITAMIN WITH MINERALS) TABS Take 1 tablet by mouth daily.     [provider]  Nintedanib (OFEV) 100 MG CAPS Take 1 capsule (100 mg total) by mouth 2 (two) times daily. 09/18/21   Brand Males, MD  olmesartan (BENICAR) 40 MG tablet Take 1 tablet (40 mg total) by mouth daily. 12/09/21   [name], MD  OXYGEN-HELIUM IN Inhale 3 L into the lungs at bedtime. And on exertion    [provider]  pantoprazole (PROTONIX) 40 MG tablet take 1 tablet by mouth once daily MINUTES BEFORE 1ST MEAL OF THE DAY 09/25/21   [name], MD  Potassium Chloride ER 20 MEQ TBCR Take 20 mEq by mouth daily. 01/08/22   [name], MD  predniSONE (DELTASONE) 10 MG tablet Take 1 tablet (10 mg total) by mouth daily with  breakfast. 05/22/21   [name], MD  sodium chloride (OCEAN) 0.65 % SOLN nasal spray Place 1 spray into both nostrils as needed for congestion.    [provider]      Allergies    Codeine, Infliximab, and Lisinopril    Review of Systems   Review of Systems  Constitutional:  Negative for chills and fever.  HENT:  Negative for ear pain and sore throat.   Eyes:  Negative for pain and visual disturbance.  Respiratory:  Negative for cough and shortness of breath.   Cardiovascular:  Negative for chest pain and palpitations.  Gastrointestinal:  Negative for abdominal pain  and vomiting.  Genitourinary:  Negative for dysuria and hematuria.  Musculoskeletal:  Positive for back pain. Negative for arthralgias.  Skin:  Positive for wound. Negative for color change and rash.  Neurological:  Positive for headaches. Negative for seizures and syncope.  All other systems reviewed and are negative.   Physical Exam Updated Vital Signs BP 114/78   Pulse 100   Temp 98.7 F (37.1 C)   Resp 18   SpO2 98%  Physical Exam Vitals and nursing note reviewed.  Constitutional:      General: She is not in acute distress.    Appearance: She is well-developed.  HENT:     Head: Normocephalic and atraumatic.     Comments: Ecchymosis and tenderness along left cheek Eyes:     Conjunctiva/sclera: Conjunctivae normal.  Cardiovascular:     Rate and Rhythm: Normal rate and regular rhythm.     Heart sounds: No murmur heard. Pulmonary:     Effort: Pulmonary effort is normal. No respiratory distress.     Breath sounds: Normal breath sounds.  Chest:     Chest wall: Tenderness (Left chest wall) present.  Abdominal:     Palpations: Abdomen is soft.     Tenderness: There is no abdominal tenderness.  Musculoskeletal:        General: Tenderness (Left mid and lower back, no midline C/T/L-spine tenderness, no step-offs) present. No swelling.     Cervical back: Normal range of motion and neck supple. No  tenderness.  Skin:    General: Skin is warm and dry.     Capillary Refill: Capillary refill takes less than 2 seconds.  Neurological:     Mental Status: She is alert.  Psychiatric:        Mood and Affect: Mood normal.     ED Results / Procedures / Treatments   Labs (all labs ordered are listed, but only abnormal results are displayed) Labs Reviewed  BASIC METABOLIC PANEL - Abnormal; Notable for the following components:      Result Value   Glucose, Bld 198 (*)    All other components within normal limits  CBC - Abnormal; Notable for the following components:   Platelets 149 (*)    All other components within normal limits    EKG EKG Interpretation  Date/Time:  Sunday February 02 2022 18:33:09 EDT Ventricular Rate:  101 PR Interval:  141 QRS Duration: 86 QT Interval:  363 QTC Calculation: 471 R Axis:   -52 Text Interpretation: Sinus tachycardia Left anterior fascicular block Low voltage, precordial leads Left ventricular hypertrophy Anterior Q waves, possibly due to LVH Confirmed by Elnora Morrison [phone]) on 02/02/2022 7:34:48 PM  Radiology DG Hand Complete Right  Result Date: 02/02/2022 CLINICAL DATA:  [place] EXAM: RIGHT HAND - COMPLETE 3+ VIEW COMPARISON:  02/25/2007 FINDINGS: Frontal, oblique, lateral views of the right hand are obtained. Evaluation of the second digit is limited due to overlying pulse oximeter. Bones are diffusely osteopenic. There is a nondisplaced fracture at the base of the fifth proximal phalanx. No other acute bony abnormalities. Severe diffuse osteoarthritis greatest at the radial aspect of the carpus. There is dorsal soft tissue swelling of the wrist and at the base of the fifth digit. IMPRESSION: 1. Nondisplaced fracture at the base of the fifth proximal phalanx. 2. Severe multifocal osteoarthritis. 3. Soft tissue swelling dorsum of the wrist and base of fifth digit. Electronically Signed   By: Randa Ngo M.D.   On: 02/02/2022 19:07   DG Chest  Portable 1 View  Result Date: 02/02/2022 CLINICAL DATA:  Fall EXAM: PORTABLE CHEST 1 VIEW COMPARISON:  12/20/2021 FINDINGS: Cardiomegaly. Both lungs are clear. The visualized skeletal structures are unremarkable. IMPRESSION: Cardiomegaly without acute abnormality of the lungs in AP portable projection. Electronically Signed   By: Delanna Ahmadi M.D.   On: 02/02/2022 19:06   CT HEAD WO CONTRAST  Result Date: 02/02/2022 CLINICAL DATA:  Head trauma, minor (Age >= 65y). Patient is being discharged from the Urgent Care and sent to the Emergency Department via POV with family. Per Mare Ferrari, NP, patient is in need of higher level of care due to fall hitting head and on Eliquis EXAM: CT HEAD WITHOUT CONTRAST TECHNIQUE: Contiguous axial images were obtained from the base of the skull through the vertex without intravenous contrast. RADIATION DOSE REDUCTION: This exam was performed according to the departmental dose-optimization program which includes automated exposure control, adjustment of the mA and/or kV according to patient size and/or use of iterative reconstruction technique. COMPARISON:  MRI head 02/18/2014, CT head 02/01/2022, CT head 02/02/2022 3:16 a.m. BRAIN: BRAIN Patchy and confluent areas of decreased attenuation are noted throughout the deep and periventricular white matter of the cerebral hemispheres bilaterally, compatible with chronic microvascular ischemic disease. No evidence of large-territorial acute infarction. No parenchymal hemorrhage. No mass lesion. No extra-axial collection. No mass effect or midline shift. No hydrocephalus. Basilar cisterns are patent. Vascular: No hyperdense vessel. Skull: No acute fracture or focal lesion. Sinuses/Orbits: Paranasal sinuses and mastoid air cells are clear. Right lens replacement. Otherwise the orbits are unremarkable. Other: None. IMPRESSION: No acute intracranial abnormality. Electronically Signed   By: Iven Finn M.D.   On: 02/02/2022 18:32    CT ANGIO HEAD NECK W WO CM  Result Date: 02/02/2022 CLINICAL DATA:  Transient ischemic attack EXAM: CT ANGIOGRAPHY HEAD AND NECK TECHNIQUE: Multidetector CT imaging of the head and neck was performed using the standard protocol during bolus administration of intravenous contrast. Multiplanar CT image reconstructions and MIPs were obtained to evaluate the vascular anatomy. Carotid stenosis measurements (when applicable) are obtained utilizing NASCET criteria, using the distal internal carotid diameter as the denominator. RADIATION DOSE REDUCTION: This exam was performed according to the departmental dose-optimization program which includes automated exposure control, adjustment of the mA and/or kV according to patient size and/or use of iterative reconstruction technique. CONTRAST:  19m OMNIPAQUE IOHEXOL 350 MG/ML SOLN COMPARISON:  None Available. FINDINGS: CT HEAD FINDINGS Brain: There is no mass, hemorrhage or extra-axial collection. The size and configuration of the ventricles and extra-axial CSF spaces are normal. There is no acute or chronic infarction. There is hypoattenuation of the periventricular white matter, most commonly indicating chronic ischemic microangiopathy. Skull: The visualized skull base, calvarium and extracranial soft tissues are normal. Sinuses/Orbits: No fluid levels or advanced mucosal thickening of the visualized paranasal sinuses. No mastoid or middle ear effusion. The orbits are normal. CTA NECK FINDINGS SKELETON: There is no bony spinal canal stenosis. No lytic or blastic lesion. OTHER NECK: Normal pharynx, larynx and major salivary glands. No cervical lymphadenopathy. Unremarkable thyroid gland. UPPER CHEST: No pneumothorax or pleural effusion. No nodules or masses. AORTIC ARCH: There is no calcific atherosclerosis of the aortic arch. There is no aneurysm, dissection or hemodynamically significant stenosis of the visualized portion of the aorta. Conventional 3 vessel aortic  branching pattern. The visualized proximal subclavian arteries are widely patent. RIGHT CAROTID SYSTEM: Normal without aneurysm, dissection or stenosis. LEFT CAROTID SYSTEM: Normal without aneurysm, dissection or stenosis. VERTEBRAL  ARTERIES: Left dominant configuration. Both origins are clearly patent. There is no dissection, occlusion or flow-limiting stenosis to the skull base (V1-V3 segments). CTA HEAD FINDINGS POSTERIOR CIRCULATION: --Vertebral arteries: Normal V4 segments. --Inferior cerebellar arteries: Normal. --Basilar artery: Normal. --Superior cerebellar arteries: Normal. --Posterior cerebral arteries (PCA): Normal. ANTERIOR CIRCULATION: --Intracranial internal carotid arteries: Normal. --Anterior cerebral arteries (ACA): Normal. Both A1 segments are present. Patent anterior communicating artery (a-comm). --Middle cerebral arteries (MCA): Normal. VENOUS SINUSES: As permitted by contrast timing, patent. ANATOMIC VARIANTS: None Review of the MIP images confirms the above findings. IMPRESSION: 1. No emergent large vessel occlusion or hemodynamically significant stenosis of the head or neck. 2. Chronic ischemic microangiopathy. Electronically Signed   By: Ulyses Jarred M.D.   On: 02/02/2022 03:16   MR BRAIN WO CONTRAST  Result Date: 02/02/2022 CLINICAL DATA:  Transient ischemic attack EXAM: MRI HEAD WITHOUT CONTRAST TECHNIQUE: Multiplanar, multiecho pulse sequences of the brain and surrounding structures were obtained without intravenous contrast. COMPARISON:  02/18/2014 FINDINGS: Brain: No acute infarct, mass effect or extra-axial collection. Multifocal peripheral predominant bilateral chronic microhemorrhage. There is confluent hyperintense T2-weighted signal within the white matter. Parenchymal volume and CSF spaces are normal. The midline structures are normal. Vascular: Major flow voids are preserved. Skull and upper cervical spine: Normal calvarium and skull base. Visualized upper cervical spine and  soft tissues are normal. Sinuses/Orbits:No paranasal sinus fluid levels or advanced mucosal thickening. No mastoid or middle ear effusion. Normal orbits. IMPRESSION: 1. No acute intracranial abnormality. 2. Advanced chronic small vessel ischemic disease. 3. Multifocal peripheral predominant bilateral chronic microhemorrhage may indicate cerebral amyloid angiopathy. Electronically Signed   By: Ulyses Jarred M.D.   On: 02/02/2022 00:38   CT HEAD WO CONTRAST  Result Date: 02/01/2022 CLINICAL DATA:  TIA, symptoms resolved EXAM: CT HEAD WITHOUT CONTRAST TECHNIQUE: Contiguous axial images were obtained from the base of the skull through the vertex without intravenous contrast. RADIATION DOSE REDUCTION: This exam was performed according to the departmental dose-optimization program which includes automated exposure control, adjustment of the mA and/or kV according to patient size and/or use of iterative reconstruction technique. COMPARISON:  04/29/2021 FINDINGS: Brain: No evidence of acute infarction, hemorrhage, hydrocephalus, extra-axial collection or mass lesion/mass effect. Periventricular and deep white matter hypodensity. Vascular: No hyperdense vessel or unexpected calcification. Skull: Normal. Negative for fracture or focal lesion. Sinuses/Orbits: No acute finding. Other: None. IMPRESSION: No acute intracranial pathology. Small-vessel white matter disease. Consider MRI to more sensitively evaluate for acute diffusion restricting infarction if suspected. Electronically Signed   By: Delanna Ahmadi M.D.   On: 02/01/2022 15:45    Procedures Procedures    Medications Ordered in ED Medications  cephALEXin (KEFLEX) capsule 500 mg (500 mg Oral Given 02/02/22 1909)    ED Course/ Medical Decision Making/ A&P                           Medical Decision Making Amount and/or Complexity of Data Reviewed Labs: ordered. Radiology: ordered.  Risk Prescription drug management.    81 year old female past  medical history of HTN, HLD, ILD, RA, type 2 diabetes, obesity, DVT/PE, on Eliquis presenting after a fall this morning.  GCS 15, alert and oriented x4.  She is at her mental status baseline.  Small ecchymosis noted to the left cheek.  Neurovascularly intact.  Patient was seen here yesterday.  Initially seen at outside hospital and transferred here for MRI.  Yesterday she presented due to a brief  episode of word finding difficulty yesterday morning.  She was asymptomatic while in the emergency department.  Her labs were consistent with a UTI and she was started on Keflex.  Differential diagnosis includes intracranial hemorrhage, concussion, fracture.  CT head reassuring.  No signs of intracranial hemorrhage. X-ray right hand did show a nondisplaced fracture at the base of the proximal phalanx of small finger. Chest x-ray reassuring.  No signs of rib fracture or pneumothorax.  Labs overall reassuring.  No anemia, leukocytosis, AKI or significant electrolyte abnormalities.  Yesterday patient was started on Keflex for UTI, however she has not taken this medication today because it has not been picked up.  Dose of Keflex given while in the ED today.  Ortho tech placed splint on small finger.  Information provided to follow-up with hand surgery.  I discussed the above results with the patient.  She continues to be at her mental status baseline.  Patient felt comfortable with discharge at this time.  Strict turn precautions given.        Final Clinical Impression(s) / ED Diagnoses Final diagnoses:  Closed nondisplaced fracture of proximal phalanx of right little finger, initial encounter  Fall, initial encounter    Rx / DC Orders ED Discharge Orders     None         Rosine Abe, MD 02/02/22 2027    Elnora Morrison, MD 02/02/22 2342

## 2022-02-02 NOTE — ED Notes (Signed)
Patient is being discharged from the Urgent Care and sent to the Emergency Department via POV with family. Per Mare Ferrari, NP, patient is in need of higher level of care due to fall hitting head and on Eliquis. Patient is aware and verbalizes understanding of plan of care.  Vitals:   02/02/22 1717  BP: 137/89  Pulse: (!) 106  Resp: 20  SpO2: 93%

## 2022-02-02 NOTE — Progress Notes (Signed)
Orthopedic Tech Progress Note Patient Details:  [name] [birth year] [id number]  Level 2 trauma Patient ID: [name], female   DOB: Oct 23, 1940, 81 y.o.   MRN: [id number]  Carin Primrose 02/02/2022, 6:27 PM

## 2022-02-02 NOTE — Discharge Instructions (Signed)
You were seen today for word finding difficulty and garbled speech.  You were found to have a UTI.  Make sure to take your antibiotics as directed.  Your imaging does not show any evidence of stroke.  Continue your blood thinner as directed.  You were referred to outpatient neurology for further evaluation.

## 2022-02-02 NOTE — ED Notes (Signed)
B p 136/96

## 2022-02-02 NOTE — ED Provider Notes (Signed)
Patient transferred for MRI.  In brief had a limited episode of word finding difficulty yesterday morning.  She is on Eliquis per the patient and her daughters.  Interestingly, urinalysis is consistent with UTI.  Urine culture was sent.  Patient was given a dose of Keflex.  MRI does not show any evidence of acute stroke.  It does show some likely chronic cerebral amyloid angiopathy.  Discussed briefly with neurology.  They recommend a CTA.  CTA does not show any large vessel occlusion or other issue.  Patient is anxious to go home.  We will provide an outpatient neurology referral.  Continue her Eliquis as directed as this is good stroke prophylaxis as well as treatment for her pulmonary embolism.  Physical Exam  BP (!) 169/110   Pulse (!) 115   Temp 98.3 F (36.8 C)   Resp 20   Ht 1.626 m (_0 )   Wt 100.7 kg   SpO2 98%   BMI 38.11 kg/m   Physical Exam Awake, alert, no acute distress  Procedures  Procedures  ED Course / MDM   Clinical Course as of 02/02/22 0351  Sat Feb 01, 2022  1807 Patient remains asymptomatic at this time.  She was excepted for transfer to [facility] ED by Dr Kathrynn Humble.  She will require ambulance transport as she is on her "as needed" home O2.  MRI was ordered.  Confirmed that the patient can tolerate MRI without needing medications, and that she has had MRIs in the past without issue.  If her MRI scan is unremarkable and she remains asymptomatic, anticipate she can be discharged home with her family.  The patient's daughter and niece are both present in agreement with the plan. [MT]    Clinical Course User Index [MT] Trifan, Carola Rhine, MD   Medical Decision Making Amount and/or Complexity of Data Reviewed Labs: ordered. Radiology: ordered.  Risk Prescription drug management.   Problem List Items Addressed This Visit       Genitourinary   UTI (urinary tract infection)   Relevant Medications   cephALEXin (KEFLEX) capsule 500 mg   cephALEXin  (KEFLEX) 500 MG capsule   Other Visit Diagnoses     Garbled speech    -  Primary             Merryl Hacker, MD 02/02/22 [phone]

## 2022-02-02 NOTE — Discharge Instructions (Addendum)
Follow-up closely with your primary doctor.  Return to the ED as needed or for any new concerns.  Continue Keflex as prescribed yesterday.  Follow-up with hand surgery for the fracture in your small finger.  I placed information below, you can call to make an appointment.  Keep brace in place.  Use Tylenol/Motrin as needed for discomfort.

## 2022-02-02 NOTE — ED Triage Notes (Signed)
Patient here with complaint of fall this morning, states she hit her face on the floor. Denies LOC, on eliquis. Patient is alert, oriented, and in no apparent distress at this time.

## 2022-02-04 ENCOUNTER — Encounter: Payer: Self-pay | Admitting: Internal Medicine

## 2022-02-04 ENCOUNTER — Ambulatory Visit (INDEPENDENT_AMBULATORY_CARE_PROVIDER_SITE_OTHER): Payer: Medicare Other | Admitting: Internal Medicine

## 2022-02-04 VITALS — BP 134/76 | HR 94 | Temp 98.9°F | Ht 64.0 in | Wt 219.0 lb

## 2022-02-04 DIAGNOSIS — J8489 Other specified interstitial pulmonary diseases: Secondary | ICD-10-CM

## 2022-02-04 DIAGNOSIS — Z8739 Personal history of other diseases of the musculoskeletal system and connective tissue: Secondary | ICD-10-CM

## 2022-02-04 DIAGNOSIS — M359 Systemic involvement of connective tissue, unspecified: Secondary | ICD-10-CM | POA: Diagnosis not present

## 2022-02-04 LAB — PULMONARY FUNCTION TEST
DL/VA % pred: 95 %
DL/VA: 3.95 ml/min/mmHg/L
DLCO cor % pred: 67 %
DLCO cor: 11.89 ml/min/mmHg
DLCO unc % pred: 67 %
DLCO unc: 11.92 ml/min/mmHg
FEF 25-75 Pre: 2.33 L/sec
FEF2575-%Pred-Pre: 179 %
FEV1-%Pred-Pre: 76 %
FEV1-Pre: 1.34 L
FEV1FVC-%Pred-Pre: 123 %
FEV6-%Pred-Pre: 65 %
FEV6-Pre: 1.47 L
FEV6FVC-%Pred-Pre: 106 %
FVC-%Pred-Pre: 62 %
FVC-Pre: 1.47 L
Pre FEV1/FVC ratio: 91 %
Pre FEV6/FVC Ratio: 100 %

## 2022-02-04 NOTE — Progress Notes (Signed)
Spirometry/DLCO performed today

## 2022-02-04 NOTE — Patient Instructions (Addendum)
ICD-10-CM   1. Interstitial lung disease due to connective tissue disease ([place])  J84.89    M35.9     2. History of rheumatoid arthritis  Z87.39        ILD slowly getting worse but stable sinc last visit in June 2023 . -> sept 2023 Severe diarrhea due to nintedanib even at the low-dose Additional fatigue could be because of the nintedanib    Plan  - Stop nintedanib; marked as allergy -Address  recent fall with neurologist -No antifibrotic till fall issues and injuries are sorted out -Take consent form for ILD-[employer]'s research study (will have someone from the [employer]   research team contact if you qualify  Followup  - 8 weeks with [name] for a face-to-face discussion and for consideration of pirfenidone

## 2022-02-04 NOTE — Patient Instructions (Signed)
Spirometry/DLCO performed today.

## 2022-02-04 NOTE — Progress Notes (Signed)
OV 01/03/2015  Chief Complaint  Patient presents with   Follow-up    Pt c/o DOE, resolving cough that started over the weekend, chest discomfort when SOB and when lying down. Pt c/o increasing fatigue.    She has severe class 3 dyspnea with hypoxemia on account of BMI 50, diaast dsyfn, chronic pain, deconditioning and ILD due to RA on celcept/pred/bactrim   I personally last saw her in march 2-016. Since then has seen other provicers in this office and most recently in June 2016. In June 2016 admitted for acute CHF - cath showed mild pulm htn only with PA mean 24 and minimal CAD (reviewed chart of procedures June 2016). Dx as acute diast chf. Since then better. SHe had fu HRCT 11/29/14 that shows progression in ILD since march 2016 and now with UIP pattern (personally visualized image). She is finally accepting of fact that ILD is not makin cause of dyspnea and obesity is main problem. She is wondering about dc cellcept/pred/bactrim so as to minimize polypharmacy and side effect profile    OV 02/09/2015  Chief Complaint  Patient presents with   Follow-up    Pt states her breathing is doing well the past few days. Pt recently went to [facility] ED for CP. Pt c/o prod cough with tan mucus. Pt denies CP/tightness    She has severe class 3 dyspnea with hypoxemia on account of BMI 50, diaast dsyfn, chronic pain, deconditioning and ILD due to RA. She is having progressive ILD. But given her miserable quality of life and polypharmacy and other side effects we decided to stop the CellCept and Bactrim. We also advise prednisone taper. This was all in the end of August 2016. But on 01/27/2015 she ended up in the emergency room with some respiratory difficulty. Her prednisone was bumped up. She subsequently saw her rheumatologist [name] was now tapering her prednisone again. There is also some concerns of headaches and possibly temporal arteritis based on her description and apparently some kind of a  biopsy scheduled. She feels that since stopping her CellCept and Bactrim her cough is returned. Nevertheless weight and knee pain and back pain continued to be major issues for her along with polypharmacy. Her Polysorb was includes MS Contin  [name]    OV 02/21/2015  - a ACUTE VISIT  Chief Complaint  Patient presents with   Acute Visit    Pt c/o of chest tightness, productive cough with white/yellow mucus, and wheezing. Pt also c/o of feeling abdominal swelling/distention. Pt also c/o of constant fatigue. Pt states symptoms have been present x1 week.     Acute visit for this morbidly obese, rheumatoid arthritis patient with ILD who is now off CellCept  I saw her as recently as 02/09/2015. She reports that for the past 1 week she's having increased chest congestion, chest tightness, wheezing, shortness of breath, cough and yellow  sputum more than baseline. Symptoms are rated as moderate in severity. Relieved by inhaler. She has tried Mucinex for 1 week with some initial partial relief but symptoms progressed. So she made an acute visit.  She continues to stay off CellCept. She is not on prednisone 10 mg per day. She tells me [name] has held her prednisone at the dose because of concerns of temporal arteritis but apparently now that his diagnoses being eliminated in the differential diagnosis. She will see [name] in the next few weeks to decide on  further prednisone taper.    OV 04/16/2015  Chief Complaint  Patient presents with   Follow-up    Pt states she felt much better after completing the abx and pred. Pt states her SOB is at basline, prod cough with yellow mucus at times, pt c/o chest tightness.     Morbidly obese female with rheumatoid arthritis with interstitial lung disease UIP pattern with associated deconditioning and diastolic dysfunction  She continues to remain off CellCept. Last seen in October 2016. At that time I discharged her from the  office with doxycycline and prednisone taper. After that early November 2016 she was given another course of Doxy and prednisone burst. This did not help. Sputum culture at this time showed normal flora. She called again with the middle of November was given Levaquin and prednisone burst. She feels Levaquin helped her immensely. At this point in time she is reporting progressive dyspnea and also worsening arthritis. She wants to go back on immunomodulators again. She has discussed Imuran with [name]. She is due to see [name] tomorrow. She also feels that she is unable to come off prednisone. She feels at a minimum she needs 10 mg of prednisone per day. She will discuss this with [name] her rheumatologist tomorrow. She's also interested in a second opinion at [facility] interstitial lung disease clinic which I did discuss with her most recently. She continues to use oxygen.     OV 07/17/2015  Chief Complaint  Patient presents with   Follow-up    Pt states that she has improved. Pt c/o continued SOB with exertion, some ocugh and wheeze, and some intermittent chest tightness. Pt states that she does sleep a lot. Pt states that her heart rate remains high and that cardiology said it was due to her lungs. Pt is currently on Lasix, 68m, but still seems to retain a lot of fluid and is not seeming to go to the bathroom as frequently.    Follow-up interstitial lung disease secondary to rheumatoid arthritis in the setting of morbid obesity, poor quality of life and heavy chronic pain opioid use and depressive symptoms  Last seen November 2016. Since then she has seen [name] rheumatology clinic. She was given instructions on Imuran which she has red but she has not decided whether she should take it or not. She subsequently has followed up with [facility] interstitial lung disease clinic. CT scan of the chest reviewed result shows possible UIP pattern 06/12/2015. She had autoimmune profile which was strongly positive for  both rheumatoid factor and cyclic citrulline peptide both markers for rheumatoid arthritis. She did see Dr. CMargreta JourneyB on 07/02/2015. It appears that the note is not complete but according to the patient was supposed to be a multidisciplinary conference and they will get back to her. It is possible a second immunomodulators agent and is being contemplated but patient is leery of this given prior issues with opportunistic infection not otherwise specified and admissions to the hospital. In the interim she is also seen cardiology Janr 2017 Dr. BGwenlyn Foundwho did not feel dyspnea was cardiac related. I reviewed his note  OV 10/17/2015  Chief Complaint  Patient presents with   Follow-up    Pt reports she feels improved. Pt c/o continued SOB with exertion, some wheeze and occasional cough. Pt denies CP/tightness.     Follow-up interstitial lung disease secondary to rheumatoid arthritis in the setting of morbid obesity, poor quality of life and heavy chronic pain opioid  use and depressive symptoms   Since seeing me last in February 2017. She followed up at [facility] pulmonary clinic in March 2017. I reviewed those notes. She saw Dr. Margreta Journey B on 07/31/2015. Due to the presence of bronchiectasis she's been started on  azithromycin 3 times a week. She says this has helped. In addition due to physical deconditioning and obesity she is getting home physical therapy is also help. She is interested in pulmonary rehabilitation although given obesity and back pain can be challenging. She nevertheless wants to try. She is on daily prednisone 10 mg a day. She met with [name] in the rheumatology clinic and has been referred now to [facility] rheumatology clinic for second opinion for a second immunomodulators. She continues to be morbidly obese and has been losing weight. She is somewhat interested in visiting with a [facility] for weight loss.   OV 02/18/2016  Chief Complaint  Patient presents with    Follow-up    Pt states she did not go to pulm rehab Mcdaniel/t BIL knee pain. Pt denies change in SOB, significant cough, CP/tightness and f/c/s. Pt states she is exercising at home. pt states she is doing well overall.    Problem List: 1.  Extensive bronchiectasis - cylindrical vs. Traction - has been present and not significantly changed from 2014 to 2017                       - has history of hospitalization for what sounds like pneumonia vs. Bronchiectasis flare as far back as 1999; underwent bronchoscopy which made her feel "like a new woman" in 1999 2.  Pulmonary fibrosis - basilar predominant reticular opacities that have not change from 2014 to 2017 3.  Rheumatoid arthritis: RF 959 ; CCP >300                       - has been treated with methotrexate, biologics, etc in the past - none in the past several years                       - currently on 7.5/85m alter day 4.  Morbid obesity with deconditioning - has had several hospitalizations in recent months; very deconditioned at present time  - [facility]     Followup for above  - [facility]. [facility] Review of the notes suggest that they have her on a slightly lower dose of prednisone with intent to taper to 5 mg per day. He also have her on hydroxychloroquine. No further disease modifying agents are being considered for rheumatoid arthritis. In terms of her interstitial lung disease and bronchiectasis she's on conservative therapy along with Activella device. This is helping and she feels better. Today she came in walking with her walker. This the happiest I have f seen her. She's not lost any weight but [facility]. She tried to go to pulmonary rehabilitation but could not because of knee pain. She is up-to-date with pneumonia vaccine. She's had a flu shot this  season.    OV 03/18/2016  Chief Complaint  Patient presents with   Acute Visit    Pt states breathing is not that bad today. Pt states she has cogestion build up, no tightness, SOB w/o excertion. Pt states  breathing has been better since last visit except the mucus. No cough feels the need to cough but can not.      follow-up bronchiectasis and immunosppessive thrapy. Visit 3 month olow-up. Body mssindex is stll very high but she says she has lost weight. Overal well. Last few days increasd symptoms of mucus, cough and subjective unwellness. No fever or colored sputum. But she feells she needs antibiotic and prednisone  OV 06/23/2016  Chief Complaint  Patient presents with   Follow-up    Pt states her SOB has been doing well but she has been more fatigued. Pt has an occ prod cough with light yellow mucus and chest tightness when SOB - resolves with rest.    Follow-up bronchiectasis with interstitial lung disease on immunosuppressive therapy due to rheumatoid arthritis. Last visit 01/17/2016. Since then she's lost 5 pounds of weight according to history. . She is here with her daughter. No acute bronchitis symptoms. Overall stable. The main issues that she's having increased joint pain and she is only on plaquenil. No fever or colored sputum.   OV 02/16/2017  Chief Complaint  Patient presents with   Follow-up    Pt states breathing has been okay. Stated she had a URI x1 month ago which she developed while in rehab after hip fx. Occ. SOB and occ. chest tightness. Denies any cough at this time.    Follow-up bronchiectasis with interstitial lung disease on immunosuppressive therapy due to rheumatoid arthritis. Last visit was 6 months ago approximately. Since then she has continued to lose weight through dietary control. She feels better. However in the summer 2018 she fell down due to orthostasis and fractured her left hip. This wasn't complicated by MRSA infection according to her history.  She is now undergoing physical therapy. Overall she's feeling well. She is very proud of her weight loss. Dyspnea is improved. She missed [facility] appointment for ILD clinic but she will make this again. She uses oxygen with heavy exertion at physical therapy and at night.  OV 02/11/2018  Subjective:  Patient ID: [name], female , DOB: 01-01-1941 , age 46 y.o. , MRN: [id number] , ADDRESS: [place] [id number]   02/11/2018 -   Chief Complaint  Patient presents with   Follow-up    Pt states she has been doing okay since last visit. States she has had some pain in the mid region of her chest x3 weeks and has also been having problems with fatigue. States she has had some occ coughing. Her main complaints are the pain in her chest and the exhaustion.     HPI Amee F Steinbach 81 y.o. -follow-up ILD with bronchiectasis in the setting of rheumatoid arthritis and obesity and physical deconditioning  It is almost a year since I last saw her.  Since then her daughter died in [place] with advanced sarcoma.  She is getting grief counseling from hospice.  She also has palliative care support at home.  She also in the last year has fallen and hurt her back.  She is on attending physical therapy which is helping her.  She says she is lost 30 pounds in the last 1 year.  Because of all this functional status is somewhat better but still she is extremely dyspneic.  In fact when she walked 185 feet x 3 laps on room air using a walker: She is only able to complete a lap and a half and stop because of extreme shortness of  breath.  Her resting pulse ox is 98%.  Final pulse ox was 98%.  And her heart rate went from 107/min to 140/min.  She has not yet had a flu shot.  Of note she is on prednisone 15 mg/day which has helped her arthritis.  She is asking if it is okay to go back down to 10 mg/day from a lung standpoint.  She is also on a different arthritis immunomodulator which is helped her  rheumatoid arthritis pain.  She gets this treatment from [facility] rheumatology.  Pulmonary ILD medications also being managed at [facility].      OV 02/10/2019  Subjective:  Patient ID: [name], female , DOB: Dec 28, 1940 , age 71 y.o. , MRN: [id number] , ADDRESS: [address] [place] [address] [name]. -follow-up ILD with bronchiectasis in the setting of rheumatoid arthritis and obesity and physical deconditioning  Problem List - copy and paste from [place] B: 1.  Extensive bronchiectasis - cylindrical vs. Traction - has been present and not significantly changed from 2014 to 2017                       - has history of hospitalization for what sounds like pneumonia vs. Bronchiectasis flare as far back as 1999; underwent bronchoscopy which made her feel "like a new woman" in 1999 2.  Pulmonary fibrosis - basilar predominant reticular opacities that have not change from 2014 to 2017 3.  Rheumatoid arthritis: RF 959 ; CCP >300                       - has been treated with methotrexate, biologics, etc in the past - none in the past several years                       - currently on 10 mg prednisone daily 4.  Morbid obesity with deconditioning - has had several hospitalizations in recent months; very deconditioned at present time   02/10/2019 -   Chief Complaint  Patient presents with   Follow-up    Pt here for ILD follow up. Pt states her breathing is unchanged since last OV in 01/2018. Pt denies new symptoms. Pt denies CP/tightness and f/s/c.      HPI Vaughn F Prevo 81 y.o. -returns for follow-up.  I personally not seen her in 1 year.  In the interim she is kept up with follow-up at [facility].  In January 2020 she suffered from left lower extremity DVT after having shortness of breath.  She was admitted at [facility] and started on Eliquis.  She still continues with Eliquis.  She tells me that Morrie Sheldon was  considered as the cause although she does have obesity and sedentary lifestyle and venous incompetency.  It appears this was her first DVT.  She has had a CT angiogram chest in 2005 that was negative for PE and a Doppler ultrasound in 2016 and 2018 that was negative for DVT.  She says a cardiologist here locally is managing her Eliquis and plans to stop it after several months.  In terms of her rheumatoid arthritis she is now under the care of [facility].  They just having her on prednisone.  In July 2020 she also saw Dr. Jacinto Reap pulmonary at [facility].  Apparently a walk test could not be performed.  She says she has lost weight.  Her main complaint is exertional dyspnea.  She wants to do portable oxygen.  She is only on nighttime oxygen.  In the past she always gets dyspneic way before she even desaturates a little bit this is on account of her obesity and deconditioning.  Her son is here with with her today.  This the first time I am meeting him.  He tells me that her breathing at night is rough although not as bad as his dad sleep apnea.  Patient is not on CPAP.  He is also curious about qualifying oxygen with walking.  Walking desaturation test for an 85 feet x 3 laps: She basically walked only half a lap.  Resting pulse ox was 97% with a heart rate of 102.  Final pulse ox was 96% with a heart rate of 122 and she stopped because of dyspnea.  She had a most recent CT chest there is resulted below and it is stable.   IMPRESSION: CT Chest high resolution 1. Mid and lower lung zone predominant bronchiectasis, peribronchovascular ground-glass and scattered subpleural reticular densities, grossly stable from 11/29/2014. Findings are nonspecific and can be seen with nonspecific interstitial pneumonitis. Usual interstitial pneumonitis is considered less likely but respiratory motion and expiratory phase imaging make assessment difficult. 2. Coronary artery calcification.     Electronically  Signed   By: Lorin Picket M.Mcdaniel.   On: 11/24/2018 13:04  ROS - per HPI  Results for NEESHA, LANGTON" (MRN [id number]) as of 02/10/2019 14:47  Ref. Range 04/17/2015 18:02 04/18/2015 13:30  Mcdaniel-Dimer, America Brown Latest Ref Range: 0.00 - 0.50 ug/mL-FEU 2.57 (H) 2.54 (H)   OV 06/21/2019  Subjective:  Patient ID: [name], female , DOB: [birth year] , age 45 y.o. , MRN: [id number] , ADDRESS: [place] [id number]   06/21/2019 -   Chief Complaint  Patient presents with   Follow-up    Pt states she was recently admitted to hospital at [facility] for 10 days. Since then, pt has been  having some low O2 sats and issues with SOB. Pt states she is better now than she was.   Sitara F Ferrall. -follow-up ILD with bronchiectasis in the setting of rheumatoid arthritis and obesity and physical deconditioning  Problem List - copy and paste from [place] B: 1.  Extensive bronchiectasis - cylindrical vs. Traction - has been present and not significantly changed from 2014 to 2017                       - has history of hospitalization for what sounds like pneumonia vs. Bronchiectasis flare as far back as 1999; underwent bronchoscopy which made her feel "like a new woman" in 1999 2.  Pulmonary fibrosis - basilar predominant reticular opacities that have not change from 2014 to 2017 and in dec 2020 CT chest 3.  Rheumatoid arthritis: RF 959 ; CCP >300                       - has been treated with methotrexate, biologics, etc in the past - none in the past several years                       - currently on 10 mg prednisone daily 4.  Morbid obesity with deconditioning - has had several hospitalizations in recent months; very deconditioned at present time   HPI Vaudie F Hymas 81  y.o. -admitted approximately 2 months ago early December 2020 with Klebsiella pansensitive urinary tract infection and bilateral lower extremity cellulitis.  Discharge on 2 L nasal  cannula.  Recommendations for home physical therapy were made.  Bilateral Doppler extremities were negative in the hospital.  Her CODE STATUS is full.  She returns for routine follow-up.  Her daughter is with her.  She continues to lose weight.  She has lost around 40 to 50 pounds.  She says she is on the medication for rheumatoid arthritis I do not know which one.  She says her cellulitis was quite bad and she is suffered from it.  But nevertheless with the weight loss her hypoxemia and overall joint pain is better.  She still has significant pedal edema and erythema in her lower extremities.  She is asking for some advice with that.  Otherwise no issues.  Using 2 L nasal cannula at night.  Walks around with a walker.  Symptom score is listed b       11/22/2020- Interim hx  with NP Patient presents today for 2 week follow-up PNA. She had a CXR that showed slight increase in opacity right lung base, questionable infiltrate. Treated with Augmentin 1 tab BID x 10 days. She is feeling a great deal better. She no longer has right sided pain. She still has some wheezing and cough with yellow mucus. She is taking muciex 1,214m twice a day. She is using her flutter valve 3-4 times a day. She has been using nebulizer twice a day. She takes 117mprednisone daily. She is on Toprol XL 7512mshe has not taken this yet today. She takes Azithromycin MWF pulmonary with [facility]. She can resume this. Needs repeat CXR today.  TEST/EVENTS : High-resolution CT chest November 24, 2018 mid to lower lung bronchiectasis, groundglass reticular densities stable since 2016  [facility]\  Interval History:  Has been doing ok until about a month ago.  Had pneumonia on the right side a month ago. Even now, sometimes she feels that she has pain in her R chest - upper front chest. Even sometimes today, it feels kind of bad.   She was doing a lot of mucus production. They she started hurting badly on the right side.  Thought she was sleeping too much. Did not pay it any mind. Went to see the doctor and she told them about the pain. They did an xray and diagnosed with pneumonia. She did not have any fever or chills - but she says that she very seldom in her life has had temp >99. She took antibiotics. Follow up xray revealed clearing. She was put on prednisone 40 mg with taper. She still gets kind of hoarse and has mucus in her upper throat. She is scheduled now for surgery for cataracts on August 9th.   She has started getting joint pain back in her knees again.   She was out of azithromycin. [facility] [facility]. Needs a 90 day supply.   Using Acapella. She has been using her albuterol nebulizer at home. She still has a lot of wheezing. This wheezing sometimes clears with coughing mucus, sometimes does not. Unsure if this gets better with the nebulizer. Happens more when she is laying down.   At the local pulmonologist, she needed oxygen and now has oxygen at home. Using [place] is using 2L with (sometimes 3L). This is with  laying down. When walking and moving, she keeps it on 2L.   Mucus production is getting better. She is using the acapella three or four times per day. She is using the nebulizer twice per day.   Has R chest discomfort which feels a bit worse today than prior. She says that it feels like a strain and it affects her voice. When she talks, it is like it is pulling or straining something.   Has help now every day at home. Son has been on her case about that.   Is on 10 mg prednisone - and has been on this dose for about a week.   Outside echo 06/04/2018: Study Conclusions   - Left ventricle: The cavity size was normal. There was mild focal    basal hypertrophy of the septum. Systolic function was normal.    The estimated ejection fraction was in the range of 60% to 65%.    Wall motion was normal; there were no regional wall motion     abnormalities.  - Right ventricle: The cavity size was mildly dilated. Wall    thickness was normal.  - Pulmonary arteries: Systolic pressure was mildly increased.   Echo pharm stress test with contrast 12/15/2018:  NORMAL STRESS TEST. NORMAL RESTING STUDY WITH NO WALL MOTION ABNORMALITIES AT REST AND PEAK STRESS. NO DOPPLER PERFORMED FOR VALVULAR REGURGITATION NO DOPPLER PERFORMED FOR VALVULAR STENOSIS NORMAL RESTING BP - APPROPRIATE RESPONSE  Cath done on 10/31/14:  Mild CAD without significant obstructive disease. Ectasia in all three  major proximal vessels.  Mild pulmonary hypertension. Aortic saturation 87%. Pulmonary artery  saturation 60%. Cardiac output 7.6 L. Cardiac index 3.3.  Continue medical therapy for lung disease. Continue preventative therapy  for her heart disease.Addendum by [name], MD on 10/31/2014 12:01 PM  Mild CAD without significant obstructive disease. Ectasia in all three  major proximal vessels.  Mild pulmonary hypertension. Aortic saturation 87%. Pulmonary artery  saturation 60%. Cardiac output 7.6 L. Cardiac index 3.3.  Current Functional Status:  Oxygen: uses 2-3L of oxygen at home PRN and with sleep.  Level of exertion that leads to dyspnea: Walking any distance makes her dyspneic.   Rehab: using the walker most of the time. When she goes out, she uses a wheelchair. Home PT was helpful. She had a problem getting in and out of vehicle and bed.  Laboratory Data/Test Results: Pulmonary Function Test (PFT) Latest Ref Rng & Units 07/02/2015 12/14/2017  FVC PRE L 1.78 1.58  FVC % PRE PRED % 63 57  FEV1 PRE L 1.49 1.33  FEV1 % PRE PRED % 70 65  FEV1/FVC PRE % 83.74 84.24  FEF25-75% PRE L/s 2.83 2.39  FEF25-75% % PRE PRED % 154 141  DLCO PRE ml/(min*mmHg) 11.28 11.41  DLCO % PRE PRED % 65 67   No PFTs today.  PFTs 12/14/17: FVC 1.58, 57 % predicted. FEV1 1.33, 65% predicted. FEV1/FVC 84.24. DLCO 11.41, 67% predicted. Compared to  06/2015, FVC is down 11.2%. DLCO is essentially stable.   Echocardiogram: 11/21/2015 - EF >55% with normal wall motion. Mildly enlarged RV with normal free wall. Mildly enlarged RA. Normal respiratory collapse of IVC. Peak RV pressure 41 mmHg.   EOTT today: Room air Spo2 96% at rest. HR at start of exercise was 122 / max HR was 150 during exercise = 106% predicted. Lowest SpO2 95% on room air. Walked 258 meters = 846 feet = 81% predicted. Total walk time = 5 minutes. Borg  dyspnea scale = 7. Test ended early because of dyspnea, fatigue, and chest tightness.   Labs:  None today  Impression: Patient is a 81 y.o. with PMH significant for rheumatoid arthritis, bronchiectasis (with flare vs. Pneumonia in late 2016), pulmonary fibrosis (not progressed per CT from 2014 to 06/2015), morbid obesity, and deconditioning who comes to pulmonary clinic for follow up of lung disease and dyspnea. She has been maintained on an aggressive bronchiectasis regimen (Acapella, nebulizer, TIW azithromycin) and she has done well. Unfortunately, she ran out of azithromycin, and in the setting of this, she developed increased cough and sputum and was treated for pneumonia.   Today, she feels like she is recovered from the PNA, but she is not all the way back to her baseline. She has some congestion in her throat and she is concerned about pain in her chest (which is reproducible with palpation over the sternum.  She has limited mobility at home. She uses furniture / counters to get around if she is not using her walker. She does some physical therapy.   Plan - from patient instructions:  I have renewed your azithromycin - you have a year supply that will be sent to [facility] in [place], [place] [facility]). You should take this every Monday, Wednesday, Friday.   I am also going to write a prescription for a 1 week supply of DAILY azithromycin. So take it daily for 7 days, then switch to every Monday, Wednesday, Friday. The  prescription is written so that this can all be under a single prescription.   Continue to use your Acapella at least 3-4 times per day.   We are going to get another chest x ray today.   Regarding your nebulizer, we will renew albuterol order from [facility].   Plan to take your nebulizer three times per day (along with Acapella use). Once your congestion is improved, you may back off to twice per day.   Try to use your voltaren gel on your chest (nearly your sternum where it is sore) to see if this helps with your pain.   Return in about 6 months (around 06/12/2021).     OV 05/23/2021  Subjective:  Patient ID: [name], female , DOB: [birth year] , age 51 y.o. , MRN: [id number] , ADDRESS: [place] [id number] PCP [name], MD Patient Care Team: [name], MD as PCP - General (Family Medicine) Lorretta Harp, MD as PCP - Cardiology (Cardiology) Duffy, Creola Corn, LCSW as Social Worker (Licensed Clinical Social Worker) Kidney, Nelida Meuse, Abel Presto, MD as Consulting Physician (Rheumatology) Efraim Kaufmann, NP (Rheumatology) Theodosia Blender, MD as Referring Physician (Pulmonary Disease)  This Provider for this visit: Treatment Team:  Attending Provider: Brand Males, MD    05/23/2021 -   Chief Complaint  Patient presents with   Follow-up    Pt states since last OV, she has been in and out of the hospital. States she also ended up with Covid and has had problems with a lot of coughing and phlegm build-up. Pt's O2 sats had been dropping and had to bump O2 up to 3L    Juline F Pesola. -follow-up ILD with bronchiectasis in the setting of rheumatoid arthritis and obesity and physical deconditioning  Problem List - copy and paste from [place] B: 1.  Extensive bronchiectasis - cylindrical vs. Traction - has been present and not significantly changed from 2014 to 2017                       -  has history of  hospitalization for what sounds like pneumonia vs. Bronchiectasis flare as far back as 1999; underwent bronchoscopy which made her feel "like a new woman" in 1999 2.  Pulmonary fibrosis - basilar predominant reticular opacities that have not change from 2014 to 2017 and in dec 2020 CT chest 3.  Rheumatoid arthritis: RF 959 ; CCP >300                       - has been treated with methotrexate, biologics, etc in the past - none in the past several years                       - currently on 10 mg prednisone daily 4.  Morbid obesity with deconditioning - has had several hospitalizations in recent months; very deconditioned at present time  - intentl wt loss +  HPI Tykeisha F Pounders 80 y.o. -returns for follow-up.  I personally not seen her in 2 years almost.  In between she is followed up with nurse practitioner and also [facility].  She is here with her daughter to [place] who I am meeting for the first time.  They tell me that she has had 2 admissions for dehydration in 2022.  After that she went to [place] [facility] where in November 2022 she ended up with COVID and was given antiviral.  They were unhappy with the admission to [facility] rehab.  They do not want to go there again.  Apparently after that she is now needing 3 L of oxygen while previously she is only needing 2 L of nasal cannula oxygen.  Last CT scan of the chest echocardiogram 2020 including review of the medical records at [place] pulmonary function test with [place] was 2015 while at [facility] it was in 2019.  She continues to have functional disability.  She uses a walker to get around and change her clothes.  She continues to be immunosuppressed with prednisone, Plaquenil and also leflunomide for rheumatoid arthritis.  She has chronic pain and is on opioids \CT Chest data  No results found.   OV 07/12/2021  Subjective:  Patient ID: [name], female , DOB: 1940-11-07 , age 44 y.o. , MRN:  [id number] , ADDRESS: [place] [id number] PCP [name], MD Patient Care Team: [name], MD as PCP - General (Family Medicine) Lorretta Harp, MD as PCP - Cardiology (Cardiology) Duffy, Creola Corn, LCSW as Social Worker (Licensed Clinical Social Worker) Kidney, Nelida Meuse, Abel Presto, MD as Consulting Physician (Rheumatology) Efraim Kaufmann, NP (Rheumatology) Theodosia Blender, MD as Referring Physician (Pulmonary Disease)  This Provider for this visit: Treatment Team:  Attending Provider: Brand Males, MD    07/12/2021 -   Chief Complaint  Patient presents with   Follow-up    PFT performed today.   Pt states her breathing is about the same since last visit. States she is no longer coughing up as much mucus compared to last visit.    Problem List - copy and paste from [place] B: 1.  Extensive bronchiectasis - cylindrical vs. Traction - has been present and not significantly changed from 2014 to 2017                       - has history of hospitalization for what sounds like pneumonia vs. Bronchiectasis flare as far back  as 1999; underwent bronchoscopy which made her feel "like a new woman" in 1999 2.  Pulmonary fibrosis - basilar predominant reticular opacities that have not change from 2014 to 2017 and in dec 2020 CT chest 3.  Rheumatoid arthritis: RF 959 ; CCP >300                       - has been treated with methotrexate, biologics, etc in the past - none in the past several years                       - currently on 10 mg prednisone daily 4.  Morbid obesity with deconditioning - has had several hospitalizations in recent months; very deconditioned at present time  - intentl wt loss +  5. COvid - Nov 2022 associated with hospitalizations for dehydration   HPI Rick F Palmieri 80 y.o. -returns for follow-up.  She presents with her daughter [name].  She is here to review results.  She does say that after her  COVID in 07 April 2021 her dyspnea is worse and then also oxygen uses up to 3 L.  Compared to the last visit she is stable but definitely compared to a few years ago she is more short of breath and requiring more oxygen.  She also reports that since November 2022 her right fifth finger has more lateral deviation.  In her fourth finger seems to be in a fixed abnormality.  She did try a short course prednisone with her primary care physician and it does not help.  She has rheumatology appointment pending with Dr. Clovis Riley at [facility] next month.  Her test results show that her echo is essentially normal.  No comment about pulmonary hypertension.  Her pulmonary function test shows FVC declined compared to 2019 at [facility] and also 2015 with [place].  This correlates with increased shortness of breath over the last few years increased subjective oxygen need.  She did have a high-resolution CT scan of the chest that our radiologist feels there is progression and she has UIP pattern with emerging honeycombing.  I personally visualized various CT scans of the chest.  I am not so sure about progression but I did tell her that I would put this up for multidisciplinary case conference.  We discussed care for progressive interstitial lung disease in the setting of rheumatoid arthritis.  Did discuss about registry protocol and I gave her the consent form.  She is interested in this.  Also discussed about nintedanib antifibrotic.  Did discuss about diarrhea as a side effect and GI issues.  She says she is constipated she and her daughter feel with polypharmacy that she should easily be able to handle this 1 other medication.  They are interested in this.  They are willing to wait till multidisciplinary case conference is done.  We also discussed the fact with WHO group 3 pulmonary hypertension inhaled treprostinil as an option.  She is never had a right heart cath.  I have written to Dr. Quay Burow her  cardiologist to consider right heart catheterization.  We will regroup in a couple of months.    MARCH 2023 ILD condrerence with Dr Burt Ek  "Heyli Mckensey Berghuis DOB: 1940-11-07 MRN:  [id number] Female, 81 y.o." [name] "   RA with ILD . Stable for years but then covid nov 2022 Rx as outpaitent. Since then o2 needs 2 -> 3L /PFT - slightly worse  FVC ? technique issue becase could not do dlco. CT reported as UIP and worse but MR not sure.   Question: is CT really worse? If so, she qualifies for registry study and ofev. "    "HRCT: 06/19/2021 HRCT: 11/24/2018 HRCT: 11/29/2014 HRCT: 07/20/2014 HRCT: 10/05/2013 HRCT: 04/08/2013" No lung biopsy    Concordant Between MDD and official read  On laest scan there is ILD and Tracheobroncholamacia and also diffuse Traction bronchiectasis without ILD. In LL there is bialteral LL HC., subpleural disease UIP that is unusual - suggestive of CTD.     Can approach as ILD     xxxxxxxxxxxxxxxx                   [facility] 08/12/21  HEMODYNAMICS:    1: Right atrial pressure-20/18 2: Right ventricular pressure-37/17, mean 22 3: Pulmonary artery pressure-43/19, mean 30 4: Pulmonary wedge pressure-A-wave 23, V wave 20, mean 22 - does not meet tyvaso criteria 5: Cardiac output-4.97 L/min with an index of 2.43 L/min/m 6: PVR-1.81 -0 does not meet tyvaso cirtiera     IMPRESSION: Ms. Axon has moderately elevated right atrial pressures and mildly elevated pulmonary artery pressures.  Her PA saturations were 58%.  The sheath was removed and a bandage was placed over the right antecubital puncture site.  The patient left the lab in stable condition.  She will be discharged home later this morning and to follow-up with [name] and myself.   Quay Burow. MD, [facility] 08/12/2021 8:06   OV 09/13/2021  Subjective:  Patient ID: [name], female , DOB: 20-Nov-1940 , age 57 y.o. , MRN: [id number] , ADDRESS: [place]  [id number] PCP [name], MD Patient Care Team: [name], MD as PCP - General (Family Medicine) Lorretta Harp, MD as PCP - Cardiology (Cardiology) Duffy, Creola Corn, LCSW as Social Worker (Licensed Clinical Social Worker) Kidney, Nelida Meuse, Abel Presto, MD as Consulting Physician (Rheumatology) Efraim Kaufmann, NP (Rheumatology) Theodosia Blender, MD as Referring Physician (Pulmonary Disease)  This Provider for this visit: Treatment Team:  Attending Provider: Brand Males, MD    09/13/2021 -   Chief Complaint  Patient presents with   Follow-up    Pt states she has been doing okay since last visit. States her breathing is about the same.   Problem List - copy and paste from [place] B: 1.  Extensive bronchiectasis - cylindrical vs. Traction - has been present and not significantly changed from 2014 to 2017                       - has history of hospitalization for what sounds like pneumonia vs. Bronchiectasis flare as far back as 1999; underwent bronchoscopy which made her feel "like a new woman" in 1999 2.  Pulmonary fibrosis - basilar predominant reticular opacities that have not change from 2014 to 2017 and in dec 2020 CT chest 3.  Rheumatoid arthritis: RF 959 ; CCP >300                       - has been treated with methotrexate, biologics, etc in the past - none in the past several years                       - currently on 10 mg prednisone daily 4.  Morbid obesity with deconditioning - has had several hospitalizations  in recent months; very deconditioned at present time  - intentl wt loss +  5. COvid - Nov 2022 associated with hospitalizations for dehydratio  HPI Fabiana F Fleites 81 y.o. -returns for follow-up.  After the last visit we discussed the multidisciplinary case conference.  Independent radiologist concurred there was UIP and she has had slow progression.  She had right heart catheterization and there is no pulmonary  hypertension to qualify her for inhaled treprostinil based on WHO group 3.  Overall she is better from a RA perspective.  She is doing physical therapy.  No new complaints.  She is still following a strict diet she is lost some more weight.  We discussed nintedanib as an antifibrotic.  She wants to do this.  She has constipation.  Did discuss that sometimes she could have diverticulitis and abdominal . Which is very rare but have seen 1 or 2 patients in the setting of opioids do this especially with nintedanib.  She is accepting of the risk.  Discussed rare possibilities of bleeding and MI.  She is understanding.  She wants to start the medication.    OV 02/04/2022  Subjective:  Patient ID: [name], female , DOB: 02-24-1941 , age 89 y.o. , MRN: [id number] , ADDRESS: [place] [id number] PCP [name], MD Patient Care Team: [name], MD as PCP - General (Family Medicine) Lorretta Harp, MD as PCP - Cardiology (Cardiology) Duffy, Creola Corn, LCSW as Social Worker (Licensed Clinical Social Worker) [place], [facility] Bo Merino, MD as Consulting Physician (Rheumatology) Efraim Kaufmann, NP (Rheumatology) Theodosia Blender, MD as Referring Physician (Pulmonary Disease)  This Provider for this visit: Treatment Team:  Attending Provider: Brand Males, MD    02/04/2022 -   Chief Complaint  Patient presents with   Follow-up    Follow up after PFT. Pt fell on Sunday and she fell a week before that as well.     Problem List - copy and paste from [place] B: 1.  Extensive bronchiectasis - cylindrical vs. Traction - has been present and not significantly changed from 2014 to 2017                       - has history of hospitalization for what sounds like pneumonia vs. Bronchiectasis flare as far back as 1999; underwent bronchoscopy which made her feel "like a new woman" in 1999 2.  Pulmonary fibrosis  - basilar predominant reticular opacities that have not change from 2014 to 2017 and in dec 2020 CT chest 3.  Rheumatoid arthritis: RF 959 ; CCP >300                       - has been treated with methotrexate, biologics, etc in the past - none in the past several years                       - currently on 10 mg prednisone daily 3b.  Normal right heart catheterization March 2023 4.  Morbid obesity with deconditioning - has had several hospitalizations in recent months; very deconditioned at present time  - intentl wt loss +  5. COvid - Nov 2022 associated with hospitalizations for dehydratin  HPI Denys F Schiro 81 y.o. -returns for follow-up.  She says that few days ago she had slurred speech noticed by the family members well on the  telephone and later when she went to the bathroom she fell patient sustained some injuries.  She went to ER all the imaging work-up as listed below.  Everything was reassuring.  The suspecting a TIA and she has been referred to a neurologist.  Her blood work 02/02/2022 was fairly unremarkable.  She has been on nintedanib since I last saw her she is only in the low-dose 100 mg twice daily because of her polypharmacy and overall poor functional status but even with that she is having significant diarrhea and fatigue.  All her symptom scores are worse.  The son also feels that her overall general wellbeing is decline in the last few weeks.  She definitely attributes the diarrhea to the nintedanib.  She is not sure about the fatigue but she believes it could be from the nintedanib.  There is no GI discomfort other than diarrhea.    SYMPTOM SCALE - ILD 06/21/2019 Birth weight not on file Last Weight  Most recent update: 06/21/2019 11:53 AM    Weight  113.3 kg (249 lb 12.8 oz)             05/23/2021 224# (used to 300# some yearsaago) 07/12/2021 224# 02/04/2022 Ofev   O2 use 2L nithg 3L 3L   Shortness of Breath 0 -> 5 scale with 5 being worst (score 6 If unable to do)      At rest _0 Simple tasks - showers, clothes change, eating, shaving _1 Household (dishes, doing bed, laundry) _2 Shopping _3 Walking level at own pace _4 Walking up Stairs 6  x 0  Total (30-36) Dyspnea Score _5 How bad is your cough? _6 How bad is your fatigue 0 0 4 5  How bad is nausea 0 0 0 0  How bad is vomiting?  0 0 0 0  How bad is diarrhea? 0 0 0 5  How bad is anxiety? _7 How bad is depression _8 CT Chest data - HRT FEb 2023  Narrative & Impression  CLINICAL DATA:  81 year old female with history of chest tightness and shortness of breath. Prior history of COVID infection in December 2022.   EXAM: CT CHEST WITHOUT CONTRAST   TECHNIQUE: Multidetector CT imaging of the chest was performed following the standard protocol without intravenous contrast. High resolution imaging of the lungs, as well as inspiratory and expiratory imaging, was performed.   RADIATION DOSE REDUCTION: This exam was performed according to the departmental dose-optimization program which includes automated exposure control, adjustment of the mA and/or kV according to patient size and/or use of iterative reconstruction technique.   COMPARISON:  Chest CT 01/16/2021.   FINDINGS: Cardiovascular: Heart size is normal. There is no significant pericardial fluid, thickening or pericardial calcification. There is aortic atherosclerosis, as well as atherosclerosis of the great vessels of the mediastinum and the coronary arteries, including calcified atherosclerotic plaque in the left anterior descending coronary artery.   Mediastinum/Nodes: No pathologically enlarged mediastinal or hilar lymph nodes. Esophagus is unremarkable in appearance. No axillary lymphadenopathy.   Lungs/Pleura: High-resolution images demonstrate some patchy areas of ground-glass attenuation, septal thickening, subpleural reticulation, mild cylindrical traction  bronchiectasis, peripheral bronchiolectasis and a few areas of apparent developing honeycombing. These findings have a definitive craniocaudal gradient and appear minimally progressive compared to  the recent prior examination. Inspiratory and expiratory imaging demonstrates some mild air trapping indicative of mild small airways disease. No acute consolidative airspace disease. No pleural effusions. No definite suspicious appearing pulmonary nodules or masses are noted.   Upper Abdomen: Unremarkable.   Musculoskeletal: There are no aggressive appearing lytic or blastic lesions noted in the visualized portions of the skeleton.   IMPRESSION: 1. The appearance of the lungs is considered diagnostic of usual interstitial pneumonia (UIP) per current ATS guidelines, demonstrating mild progression compared to prior examinations. 2. Aortic atherosclerosis, in addition to left anterior descending coronary artery disease. Please note that although the presence of coronary artery calcium documents the presence of coronary artery disease, the severity of this disease and any potential stenosis cannot be assessed on this non-gated CT examination. Assessment for potential risk factor modification, dietary therapy or pharmacologic therapy may be warranted, if clinically indicated.   Aortic Atherosclerosis (ICD10-I70.0).     Electronically Signed   By: Vinnie Langton M.Mcdaniel.   On: 06/21/2021 07:48    DG Hand Complete Right  Result Date: 02/02/2022 CLINICAL DATA:  [place] EXAM: RIGHT HAND - COMPLETE 3+ VIEW COMPARISON:  02/25/2007 FINDINGS: Frontal, oblique, lateral views of the right hand are obtained. Evaluation of the second digit is limited due to overlying pulse oximeter. Bones are diffusely osteopenic. There is a nondisplaced fracture at the base of the fifth proximal phalanx. No other acute bony abnormalities. Severe diffuse osteoarthritis greatest at the radial aspect of the carpus. There is  dorsal soft tissue swelling of the wrist and at the base of the fifth digit. IMPRESSION: 1. Nondisplaced fracture at the base of the fifth proximal phalanx. 2. Severe multifocal osteoarthritis. 3. Soft tissue swelling dorsum of the wrist and base of fifth digit. Electronically Signed   By: Randa Ngo M.Mcdaniel.   On: 02/02/2022 19:07   DG Chest Portable 1 View  Result Date: 02/02/2022 CLINICAL DATA:  [place] EXAM: PORTABLE CHEST 1 VIEW COMPARISON:  12/20/2021 FINDINGS: Cardiomegaly. Both lungs are clear. The visualized skeletal structures are unremarkable. IMPRESSION: Cardiomegaly without acute abnormality of the lungs in AP portable projection. Electronically Signed   By: Delanna Ahmadi M.Mcdaniel.   On: 02/02/2022 19:06   CT HEAD WO CONTRAST  Result Date: 02/02/2022 CLINICAL DATA:  Head trauma, minor (Age >= 65y). Patient is being discharged from the Urgent Care and sent to the Emergency Department via POV with family. Per Mare Ferrari, NP, patient is in need of higher level of care due to fall hitting head and on Eliquis EXAM: CT HEAD WITHOUT CONTRAST TECHNIQUE: Contiguous axial images were obtained from the base of the skull through the vertex without intravenous contrast. RADIATION DOSE REDUCTION: This exam was performed according to the departmental dose-optimization program which includes automated exposure control, adjustment of the mA and/or kV according to patient size and/or use of iterative reconstruction technique. COMPARISON:  MRI head 02/18/2014, CT head 02/01/2022, CT head 02/02/2022 3:16 a.m. BRAIN: BRAIN Patchy and confluent areas of decreased attenuation are noted throughout the deep and periventricular white matter of the cerebral hemispheres bilaterally, compatible with chronic microvascular ischemic disease. No evidence of large-territorial acute infarction. No parenchymal hemorrhage. No mass lesion. No extra-axial collection. No mass effect or midline shift. No hydrocephalus. Basilar cisterns  are patent. Vascular: No hyperdense vessel. Skull: No acute fracture or focal lesion. Sinuses/Orbits: Paranasal sinuses and mastoid air cells are clear. Right lens replacement. Otherwise the orbits are unremarkable. Other: None. IMPRESSION: No acute intracranial abnormality. Electronically  Signed   By: Iven Finn M.Mcdaniel.   On: 02/02/2022 18:32   CT ANGIO HEAD NECK W WO CM  Result Date: 02/02/2022 CLINICAL DATA:  Transient ischemic attack EXAM: CT ANGIOGRAPHY HEAD AND NECK TECHNIQUE: Multidetector CT imaging of the head and neck was performed using the standard protocol during bolus administration of intravenous contrast. Multiplanar CT image reconstructions and MIPs were obtained to evaluate the vascular anatomy. Carotid stenosis measurements (when applicable) are obtained utilizing NASCET criteria, using the distal internal carotid diameter as the denominator. RADIATION DOSE REDUCTION: This exam was performed according to the departmental dose-optimization program which includes automated exposure control, adjustment of the mA and/or kV according to patient size and/or use of iterative reconstruction technique. CONTRAST:  91m OMNIPAQUE IOHEXOL 350 MG/ML SOLN COMPARISON:  None Available. FINDINGS: CT HEAD FINDINGS Brain: There is no mass, hemorrhage or extra-axial collection. The size and configuration of the ventricles and extra-axial CSF spaces are normal. There is no acute or chronic infarction. There is hypoattenuation of the periventricular white matter, most commonly indicating chronic ischemic microangiopathy. Skull: The visualized skull base, calvarium and extracranial soft tissues are normal. Sinuses/Orbits: No fluid levels or advanced mucosal thickening of the visualized paranasal sinuses. No mastoid or middle ear effusion. The orbits are normal. CTA NECK FINDINGS SKELETON: There is no bony spinal canal stenosis. No lytic or blastic lesion. OTHER NECK: Normal pharynx, larynx and major salivary  glands. No cervical lymphadenopathy. Unremarkable thyroid gland. UPPER CHEST: No pneumothorax or pleural effusion. No nodules or masses. AORTIC ARCH: There is no calcific atherosclerosis of the aortic arch. There is no aneurysm, dissection or hemodynamically significant stenosis of the visualized portion of the aorta. Conventional 3 vessel aortic branching pattern. The visualized proximal subclavian arteries are widely patent. RIGHT CAROTID SYSTEM: Normal without aneurysm, dissection or stenosis. LEFT CAROTID SYSTEM: Normal without aneurysm, dissection or stenosis. VERTEBRAL ARTERIES: Left dominant configuration. Both origins are clearly patent. There is no dissection, occlusion or flow-limiting stenosis to the skull base (V1-V3 segments). CTA HEAD FINDINGS POSTERIOR CIRCULATION: --Vertebral arteries: Normal V4 segments. --Inferior cerebellar arteries: Normal. --Basilar artery: Normal. --Superior cerebellar arteries: Normal. --Posterior cerebral arteries (PCA): Normal. ANTERIOR CIRCULATION: --Intracranial internal carotid arteries: Normal. --Anterior cerebral arteries (ACA): Normal. Both A1 segments are present. Patent anterior communicating artery (a-comm). --Middle cerebral arteries (MCA): Normal. VENOUS SINUSES: As permitted by contrast timing, patent. ANATOMIC VARIANTS: None Review of the MIP images confirms the above findings. IMPRESSION: 1. No emergent large vessel occlusion or hemodynamically significant stenosis of the head or neck. 2. Chronic ischemic microangiopathy. Electronically Signed   By: KUlyses JarredM.Mcdaniel.   On: 02/02/2022 03:16   MR BRAIN WO CONTRAST  Result Date: 02/02/2022 CLINICAL DATA:  Transient ischemic attack EXAM: MRI HEAD WITHOUT CONTRAST TECHNIQUE: Multiplanar, multiecho pulse sequences of the brain and surrounding structures were obtained without intravenous contrast. COMPARISON:  02/18/2014 FINDINGS: Brain: No acute infarct, mass effect or extra-axial collection. Multifocal  peripheral predominant bilateral chronic microhemorrhage. There is confluent hyperintense T2-weighted signal within the white matter. Parenchymal volume and CSF spaces are normal. The midline structures are normal. Vascular: Major flow voids are preserved. Skull and upper cervical spine: Normal calvarium and skull base. Visualized upper cervical spine and soft tissues are normal. Sinuses/Orbits:No paranasal sinus fluid levels or advanced mucosal thickening. No mastoid or middle ear effusion. Normal orbits. IMPRESSION: 1. No acute intracranial abnormality. 2. Advanced chronic small vessel ischemic disease. 3. Multifocal peripheral predominant bilateral chronic microhemorrhage may indicate cerebral amyloid angiopathy. Electronically Signed  By: Ulyses Jarred M.Mcdaniel.   On: 02/02/2022 00:38   CT HEAD WO CONTRAST  Result Date: 02/01/2022 CLINICAL DATA:  TIA, symptoms resolved EXAM: CT HEAD WITHOUT CONTRAST TECHNIQUE: Contiguous axial images were obtained from the base of the skull through the vertex without intravenous contrast. RADIATION DOSE REDUCTION: This exam was performed according to the departmental dose-optimization program which includes automated exposure control, adjustment of the mA and/or kV according to patient size and/or use of iterative reconstruction technique. COMPARISON:  04/29/2021 FINDINGS: Brain: No evidence of acute infarction, hemorrhage, hydrocephalus, extra-axial collection or mass lesion/mass effect. Periventricular and deep white matter hypodensity. Vascular: No hyperdense vessel or unexpected calcification. Skull: Normal. Negative for fracture or focal lesion. Sinuses/Orbits: No acute finding. Other: None. IMPRESSION: No acute intracranial pathology. Small-vessel white matter disease. Consider MRI to more sensitively evaluate for acute diffusion restricting infarction if suspected. Electronically Signed   By: Delanna Ahmadi M.Mcdaniel.   On: 02/01/2022 15:45      PFT     Latest Ref Rng &  Units 02/04/2022    3:06 PM 07/12/2021    2:29 PM 01/12/2014   12:59 PM 09/12/2013   10:02 AM 04/28/2013   10:07 AM  PFT Results  FVC-Pre L 1.47  P 1.29  1.59  1.78  1.73   FVC-Predicted Pre % 62  P 70  76  77  74   FVC-Post L   1.46  1.54  1.19   FVC-Predicted Post %   70  66  51   Pre FEV1/FVC % % 91  P 92  91  91  84   Post FEV1/FCV % %   93  89  97   FEV1-Pre L 1.34  P 1.18  1.45  1.63  1.46   FEV1-Predicted Pre % 76  P 83  90  90  81   FEV1-Post L   1.36  1.36  1.15   DLCO uncorrected ml/min/mmHg 11.92  P -0.03  16.69  12.50    DLCO UNC% % 67  P 0  77  51    DLCO corrected ml/min/mmHg 11.89  P -0.03      DLCO COR %Predicted % 67  P 0      DLVA Predicted % 95  P 29  95  91    TLC L   3.55  3.57  2.94   TLC % Predicted %   74  70  58   RV % Predicted %   62  66  62     P Preliminary result       has a past medical history of Acute on chronic diastolic (congestive) heart failure (HCC), Anemia, Anxiety, Arthritis, Asthma, Carotid artery occlusion, Chronic fatigue, Chronic kidney disease, Closed left hip fracture (HCC), Clotting disorder (HCC), Contusion of left knee, COPD (chronic obstructive pulmonary disease) (HCC), Depression, reactive, Diabetes mellitus, Diastolic dysfunction, Difficulty in walking, Family history of heart disease, Generalized muscle weakness, Gout, High cholesterol, History of falling, Hypertension, Hypothyroidism, Interstitial lung disease ([place]), 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   reports that she has never smoked. She has never used smokeless tobacco.  Past Surgical History:  Procedure Laterality Date   ABDOMINAL HYSTERECTOMY     BRAIN SURGERY     Gamma knife 10/13. Needs  repeat spring  '14   CARDIAC CATHETERIZATION N/A 10/31/2014   Procedure: Right/Left Heart Cath and Coronary Angiography;  Surgeon: [name], MD;  Location: [facility];  Service: Cardiovascular;  Laterality: N/A;   CYST EXCISION     2 on face   CYST REMOVAL NECK     ESOPHAGOGASTRODUODENOSCOPY (EGD) WITH PROPOFOL N/A 09/14/2014   Procedure: ESOPHAGOGASTRODUODENOSCOPY (EGD) WITH PROPOFOL;  Surgeon: [name], MD;  Location: [facility];  Service: Endoscopy;  Laterality: N/A;   INCISION AND DRAINAGE HIP Left 01/16/2017   Procedure: IRRIGATION AND DEBRIDEMENT LEFT HIP;  Surgeon: [name], MD;  Location: [facility];  Service: Orthopedics;  Laterality: Left;   INTRAMEDULLARY (IM) NAIL INTERTROCHANTERIC Left 11/29/2016   Procedure: INTRAMEDULLARY (IM) NAIL INTERTROCHANTRIC;  Surgeon: [name], MD;  Location: [facility];  Service: Orthopedics;  Laterality: Left;   OVARY SURGERY     RIGHT HEART CATH N/A 08/12/2021   Procedure: RIGHT HEART CATH;  Surgeon: Lorretta Harp, MD;  Location: [facility];  Service: Cardiovascular;  Laterality: N/A;   SHOULDER SURGERY Left    TONSILLECTOMY  age 43   VIDEO BRONCHOSCOPY Bilateral 05/31/2013   Procedure: VIDEO BRONCHOSCOPY WITHOUT FLUORO;  Surgeon: Brand Males, MD;  Location: [facility];  Service: Cardiopulmonary;  Laterality: Bilateral;   video bronscoscopy  05/19/1998   lung    Allergies  Allergen Reactions   Codeine Swelling and Other (See Comments)    Facial swelling Chest pain and swelling in legs     Infliximab Anaphylaxis    (REMICADE) "sent me into shock"    Lisinopril Swelling    Face and neck swelling     Immunization History  Administered Date(s) Administered   Fluad Quad(high Dose 65+) 02/02/2019, 03/11/2021   Influenza Split 01/29/2012, 03/30/2013, 02/10/2014   Influenza, High Dose Seasonal PF 02/16/2017, 02/11/2018, 01/31/2019, 02/27/2020   Influenza,inj,Quad PF,6+ Mos  02/09/2015, 12/18/2015   Influenza-Unspecified 02/09/2015, 12/18/2015   PFIZER Comirnaty(Gray Top)Covid-19 Tri-Sucrose Vaccine 07/24/2019, 08/14/2019, 02/27/2020   PFIZER(Purple Top)SARS-COV-2 Vaccination 07/17/2019, 08/14/2019   PPD Test 04/23/2015   Pneumococcal Conjugate-13 08/21/2014   Pneumococcal Polysaccharide-23 02/17/2012   Tdap 12/27/2015    Family History  Problem Relation Age of Onset   Diabetes Mother    Heart attack Mother    Hypertension Father    Lung cancer Father    Diabetes Sister    Breast cancer Sister    Breast cancer Sister    Diabetes Brother    Hypertension Brother    Heart disease Brother    Heart attack Brother    Kidney cancer Brother    Gout Brother    Kidney failure Brother        x 5   Esophageal cancer Brother    Stomach cancer Brother    Prostate cancer Brother    Uterine cancer Daughter    Hypertension Daughter    Hypertension Daughter    Rheum arthritis Maternal Uncle    Hypertension Son    Heart murmur Son      Current Outpatient Medications:    acetaminophen (TYLENOL) 500 MG tablet, Take 500-1,000 mg by mouth every 6 (six) hours as needed for moderate pain., Disp: , Rfl:    albuterol (PROVENTIL) (2.5 MG/3ML) 0.083% nebulizer solution, Take 3 mLs (2.5 mg total) by nebulization every 6 (six) hours as needed for wheezing., Disp: 75 mL, Rfl: 0   allopurinol (ZYLOPRIM) 300 MG tablet, Take 1 tablet (300 mg total) by mouth daily.,  Disp: 30 tablet, Rfl: 0   apixaban (ELIQUIS) 5 MG TABS tablet, Take 1 tablet (5 mg total) by mouth 2 (two) times daily., Disp: 60 tablet, Rfl: 5   Artificial Tear Ointment (DRY EYES OP), Place 1 drop into both eyes 2 (two) times daily as needed (dry eyes)., Disp: , Rfl:    atorvastatin (LIPITOR) 20 MG tablet, Take 0.5 tablets (10 mg total) by mouth every evening. (Patient taking differently: Take 20 mg by mouth every evening.), Disp: 15 tablet, Rfl: 0   azithromycin (ZITHROMAX) 250 MG tablet, Take 250 mg by mouth 3  (three) times a week., Disp: , Rfl:    Calcium Carb-Cholecalciferol (CALCIUM + D3 PO), Take 2 tablets by mouth daily with lunch. , Disp: , Rfl:    cephALEXin (KEFLEX) 500 MG capsule, Take 1 capsule (500 mg total) by mouth 3 (three) times daily., Disp: 21 capsule, Rfl: 0   Cholecalciferol (VITAMIN Mcdaniel-3) 25 MCG (1000 UT) CAPS, Take 2 capsules (2,000 Units total) by mouth daily. GIVE ONE TAB PO Q DAY, Disp: 60 capsule, Rfl: 0   colchicine 0.6 MG tablet, Take 1 tablet (0.6 mg total) by mouth daily as needed (gout flares)., Disp: 30 tablet, Rfl: 0   cyclobenzaprine (FLEXERIL) 5 MG tablet, Take 1 tablet (5 mg total) by mouth at bedtime as needed for muscle spasms., Disp: 90 tablet, Rfl: 1   diclofenac Sodium (VOLTAREN) 1 % GEL, Apply 2 g topically 3 (three) times daily. Apply to both hands and knees, Disp: 100 g, Rfl: 1   Dulaglutide (TRULICITY) 1.61 WR/6.0AV SOPN, Inject 0.75 mg into the skin once a week., Disp: 6 mL, Rfl: 2   DULoxetine (CYMBALTA) 60 MG capsule, Take 1 capsule (60 mg total) by mouth daily., Disp: 90 capsule, Rfl: 1   fluticasone (FLONASE) 50 MCG/ACT nasal spray, Place 2 sprays into both nostrils daily., Disp: 16 g, Rfl: 3   furosemide (LASIX) 80 MG tablet, Take 1 tablet (80 mg total) by mouth daily., Disp: 30 tablet, Rfl: 11   gabapentin (NEURONTIN) 600 MG tablet, Take 2 tablets (1,200 mg total) by mouth 3 (three) times daily., Disp: 180 tablet, Rfl: 1   guaiFENesin (MUCINEX) 600 MG 12 hr tablet, Take 2 tablets (1,200 mg total) by mouth 2 (two) times daily., Disp: 120 tablet, Rfl: 0   hydrALAZINE (APRESOLINE) 25 MG tablet, Take 1 tablet (25 mg total) by mouth 2 (two) times daily., Disp: 180 tablet, Rfl: 3   hydrALAZINE (APRESOLINE) 25 MG tablet, Take 1 tablet (25 mg total) by mouth 2 (two) times daily., Disp: 60 tablet, Rfl: 0   hydroxychloroquine (PLAQUENIL) 200 MG tablet, Take 1 tablet (200 mg total) by mouth daily., Disp: 30 tablet, Rfl: 0   leflunomide (ARAVA) 20 MG tablet, Take 1  tablet (20 mg total) by mouth daily., Disp: 30 tablet, Rfl: 0   levothyroxine (SYNTHROID) 50 MCG tablet, Take 1 tablet (50 mcg total) by mouth daily., Disp: 90 tablet, Rfl: 3   linaclotide (LINZESS) 72 MCG capsule, Take 1 capsule (72 mcg total) by mouth daily before breakfast., Disp: 30 capsule, Rfl: 3   metolazone (ZAROXOLYN) 2.5 MG tablet, TAKE 1 TABLET BY MOUTH DAILY AS NEEDED FOR 2 DAYS FOR WEIGHT ABOVE 255 POUNDS, Disp: 30 tablet, Rfl: 0   metoprolol succinate (TOPROL-XL) 25 MG 24 hr tablet, Take 5 tablets (125 mg total) by mouth daily., Disp: 150 tablet, Rfl: 3   morphine (MS CONTIN) 15 MG 12 hr tablet, Take 1 tablet (15 mg total) by  mouth every 12 (twelve) hours. Do Not Fill Before 03/02/2022, Disp: 60 tablet, Rfl: 0   morphine (MSIR) 15 MG tablet, Take 1 tablet (15 mg total) by mouth 2 (two) times daily., Disp: 60 tablet, Rfl: 0   Multiple Vitamin (MULTIVITAMIN WITH MINERALS) TABS, Take 1 tablet by mouth daily. , Disp: , Rfl:    Nintedanib (OFEV) 100 MG CAPS, Take 1 capsule (100 mg total) by mouth 2 (two) times daily., Disp: 180 capsule, Rfl: 1   olmesartan (BENICAR) 40 MG tablet, Take 1 tablet (40 mg total) by mouth daily., Disp: 30 tablet, Rfl: 0   OXYGEN-HELIUM IN, Inhale 3 L into the lungs at bedtime. And on exertion, Disp: , Rfl:    pantoprazole (PROTONIX) 40 MG tablet, take 1 tablet by mouth once daily MINUTES BEFORE 1ST MEAL OF THE DAY, Disp: 90 tablet, Rfl: 3   Potassium Chloride ER 20 MEQ TBCR, Take 20 mEq by mouth daily., Disp: 30 tablet, Rfl: 0   predniSONE (DELTASONE) 10 MG tablet, Take 1 tablet (10 mg total) by mouth daily with breakfast., Disp: 90 tablet, Rfl: 1   sodium chloride (OCEAN) 0.65 % SOLN nasal spray, Place 1 spray into both nostrils as needed for congestion., Disp: , Rfl:       Objective:   Vitals:   02/04/22 1555  BP: 134/76  Pulse: 94  Temp: 98.9 F (37.2 C)  TempSrc: Oral  SpO2: 95%  Weight: 219 lb (99.3 kg)  Height: _0  (1.626 m)    Estimated  body mass index is 37.59 kg/m as calculated from the following:   Height as of this encounter: _1  (1.626 m).   Weight as of this encounter: 219 lb (99.3 kg).  _2 @  Filed Weights   02/04/22 1555  Weight: 219 lb (99.3 kg)     Physical Exam    General: No distress.  Obese lady deconditioned sitting in the wheelchair.  Pleasant but flat affect Neuro: Alert and Oriented x 3. GCS 15. Speech normal Psych: Pleasant Resp:  Barrel Chest - no.  Wheeze - no, Crackles - yes base, No overt respiratory distress CVS: Normal heart sounds. Murmurs - no Ext: Stigmata of Connective Tissue Disease - RA HEENT: Normal upper airway. PEERL +. No post nasal drip        Assessment:       ICD-10-CM   1. Interstitial lung disease due to connective tissue disease ([place])  J84.89    M35.9     2. History of rheumatoid arthritis  Z87.39          Plan:     Patient Instructions     ICD-10-CM   1. Interstitial lung disease due to connective tissue disease ([place])  J84.89    M35.9     2. History of rheumatoid arthritis  Z87.39        ILD slowly getting worse but stable sinc last visit in June 2023 . -> sept 2023 Severe diarrhea due to nintedanib even at the low-dose Additional fatigue could be because of the nintedanib    Plan  - Stop nintedanib; marked as allergy -Address  recent fall with neurologist -No antifibrotic till fall issues and injuries are sorted out -To consent form for [employer]'s research study (will have someone from the [facility]   research team contact if you qualify  Followup  - 8 weeks with [name] for a face-to-face discussion and for consideration of pirfenidone  SIGNATURE    Dr. Brand Males, M.Mcdaniel., F.C.C.P,  Pulmonary and Critical Care Medicine Staff Physician, [place] Director - Interstitial Lung Disease  Program  [facility] [place], [place], [address]  Pager:  [phone], If no answer or between  15:00h - 7:00h: call [phone] Telephone: [phone]  4:31 PM 02/04/2022

## 2022-02-07 NOTE — ED Provider Notes (Signed)
Patient presents to urgent care for evaluation after she fell while walking to the bathroom this afternoon hitting her head on her jewelry box/stand. There is small contusion to the left maxillary region without obvious abrasion/laceration to the face/head. She is neurologically intact to baseline and complains of slight headache.  She takes Eliquis daily. Denies dizziness prior to fall, LOC, and blurry vision. States she landed on her right arm and is complaining of pain to the right hand. Denies nausea, vomiting, or dizziness after the fall. She was able to get herself up off the floor by herself. Walks with a cane at baseline and reports history of frequent falls.  Due to blood thinner use and facial contusion, recommend patient go to the nearest emergency department for further evaluation and CT scan of the head to rule out bleed. Neuro exam is intact and to baseline at this time. Vitals are hemodynamically stable and patient is alert/oriented to baseline. She may to go the ED by POV. Discussed risks of deferring ED visit to which she verbalizes understanding and agreement. Daughter to drive her to the ED for further eval.   Talbot Grumbling, FNP 02/07/22 1214

## 2022-02-10 ENCOUNTER — Ambulatory Visit (INDEPENDENT_AMBULATORY_CARE_PROVIDER_SITE_OTHER): Payer: Medicare Other | Admitting: Family Medicine

## 2022-02-10 VITALS — BP 162/84 | HR 107 | Temp 98.1°F

## 2022-02-10 DIAGNOSIS — R58 Hemorrhage, not elsewhere classified: Secondary | ICD-10-CM | POA: Diagnosis not present

## 2022-02-10 DIAGNOSIS — S62646D Nondisplaced fracture of proximal phalanx of right little finger, subsequent encounter for fracture with routine healing: Secondary | ICD-10-CM | POA: Diagnosis not present

## 2022-02-10 DIAGNOSIS — I10 Essential (primary) hypertension: Secondary | ICD-10-CM

## 2022-02-10 MED ORDER — OLMESARTAN MEDOXOMIL 40 MG PO TABS: 40.0000 mg | ORAL_TABLET | Freq: Every day | ORAL | 3 refills | Status: DC

## 2022-02-10 NOTE — Progress Notes (Signed)
Subjective:    Patient ID: [name], female    DOB: 1940-11-10, 81 y.o.   MRN: [id number]  Chief Complaint  Patient presents with   Follow-up  Pt accompanied by her daughter  HPI Patient was seen today for follow-up on various concerns.  Patient endorses right fifth digit fracture after fall.  Has finger buddy taped to adjacent finger.  Has follow-up with [facility] hand on Friday, Dr. Gaynelle Arabian, unsure if referral has been placed.  Pt taking Benicar 40 mg daily for BP.  States needs refill.  Requesting 90-day supply.  Patient notes improvement in right upper arm pain.  Denies bruising in area.  Has easy bruising in other areas given history of Eliquis use.  Past Medical History:  Diagnosis Date   Acute on chronic diastolic (congestive) heart failure (HCC)    Anemia    iron deficiency anemia - secondary to blood loss ( chronic)    Anxiety    Arthritis    endstage changes bilateral knees/bilateral ankles.    Asthma    Carotid artery occlusion    Chronic fatigue    Chronic kidney disease    Closed left hip fracture (HCC)    Clotting disorder ([place])    pt denies this   Contusion of left knee    due to fall 1/14.   COPD (chronic obstructive pulmonary disease) (HCC)    pulmonary fibrosis   Depression, reactive    Diabetes mellitus    type II    Diastolic dysfunction    Difficulty in walking    Family history of heart disease    Generalized muscle weakness    Gout    High cholesterol    History of falling    Hypertension    Hypothyroidism    Interstitial lung disease ([place])    Meningioma of left sphenoid wing involving cavernous sinus ([place]) 02/17/2012   Continue diplopia, left eye pain and left headaches.     Morbid obesity ([place])    Neuromuscular disorder ([place])    diabetic neuropathy    Normal coronary arteries    cardiac catheterization performed  10/31/14   Pneumonia    RA (rheumatoid arthritis) ([place])    has been off methotreaxte since 10/13.   Spinal stenosis  of lumbar region    Thyroid disease    Unspecified lack of coordination    URI (upper respiratory infection)     Allergies  Allergen Reactions   Codeine Swelling and Other (See Comments)    Facial swelling Chest pain and swelling in legs     Infliximab Anaphylaxis    (REMICADE) "sent me into shock"    Lisinopril Swelling    Face and neck swelling     ROS General: Denies fever, chills, night sweats, changes in weight, changes in appetite HEENT: Denies headaches, ear pain, changes in vision, rhinorrhea, sore throat CV: Denies CP, palpitations, SOB, orthopnea Pulm: Denies SOB, cough, wheezing GI: Denies abdominal pain, nausea, vomiting, diarrhea, constipation GU: Denies dysuria, hematuria, frequency, vaginal discharge Msk: Denies muscle cramps, joint pains +R 5th digit fx Neuro: Denies weakness, numbness, tingling Skin: Denies rashes + ecchymosis Psych: Denies depression, anxiety, hallucinations     Objective:    Blood pressure (!) 162/84, pulse (!) 107, temperature 98.1 F (36.7 C), temperature source Oral, SpO2 92 %.  Gen. Pleasant, well-nourished, in no distress, normal affect   HEENT: [place]/AT, face symmetric, conjunctiva clear, no scleral icterus, PERRLA, EOMI, nares patent without drainage Lungs: no accessory muscle  use, CTAB, no wheezes or rales Cardiovascular: RRR, no m/r/g, no peripheral edema Musculoskeletal: Right fifth digit in metal splint taped to right fourth digit.  Finger rewrapped in clinic no cyanosis or clubbing, normal tone Neuro:  A&Ox3, CN II-XII intact, gait not assessed as sitting in transport wheelchair Skin:  Warm, no lesions/ rash.  Areas of ecchymosis on extremities.   Wt Readings from Last 3 Encounters:  02/04/22 219 lb (99.3 kg)  02/01/22 222 lb (100.7 kg)  01/28/22 222 lb (100.7 kg)    Lab Results  Component Value Date   WBC 7.5 02/02/2022   HGB 13.5 02/02/2022   HCT 43.0 02/02/2022   PLT 149 (L) 02/02/2022   GLUCOSE 198 (H)  02/02/2022   CHOL 136 09/26/2020   TRIG 283.0 (H) 09/26/2020   HDL 40.00 09/26/2020   LDLDIRECT 59.0 09/26/2020   LDLCALC 75 09/22/2019   ALT 20 02/01/2022   AST 15 02/01/2022   NA 144 02/02/2022   K 4.0 02/02/2022   CL 102 02/02/2022   CREATININE 0.93 02/02/2022   BUN 13 02/02/2022   CO2 32 02/02/2022   TSH 1.33 09/25/2021   INR 1.2 02/01/2022   HGBA1C 6.5 (H) 04/02/2021    Assessment/Plan:  Closed nondisplaced fracture of proximal phalanx of right little finger with routine healing, subsequent encounter  -Stable -Finger rewrapped in clinic -Patient has appointment with hand surgery but unsure if referral was placed.  We will replace referral this visit - Plan: Ambulatory referral to Hand Surgery  Ecchymosis -stable -2/2 chronic Eliquis use for history of DVT and PE -Upper R arm discomfort improving.  Possibly 2/2B large area of ecchymosis that was present when symptoms initially started. -Continue to monitor  Essential hypertension -Elevated -Recheck -Continue current medications including hydralazine, metoprolol succinate 125 mg, Benicar 40 mg, Lasix 80 mg -Continue lifestyle modifications - Plan: olmesartan (BENICAR) 40 MG tablet  F/u as needed  Grier Mitts, MD

## 2022-02-12 DIAGNOSIS — E1122 Type 2 diabetes mellitus with diabetic chronic kidney disease: Secondary | ICD-10-CM | POA: Diagnosis not present

## 2022-02-12 DIAGNOSIS — I129 Hypertensive chronic kidney disease with stage 1 through stage 4 chronic kidney disease, or unspecified chronic kidney disease: Secondary | ICD-10-CM | POA: Diagnosis not present

## 2022-02-12 DIAGNOSIS — N183 Chronic kidney disease, stage 3 unspecified: Secondary | ICD-10-CM | POA: Diagnosis not present

## 2022-02-12 DIAGNOSIS — N390 Urinary tract infection, site not specified: Secondary | ICD-10-CM | POA: Diagnosis not present

## 2022-02-12 DIAGNOSIS — N2581 Secondary hyperparathyroidism of renal origin: Secondary | ICD-10-CM | POA: Diagnosis not present

## 2022-02-14 DIAGNOSIS — S62646A Nondisplaced fracture of proximal phalanx of right little finger, initial encounter for closed fracture: Secondary | ICD-10-CM | POA: Diagnosis not present

## 2022-02-14 DIAGNOSIS — H26491 Other secondary cataract, right eye: Secondary | ICD-10-CM | POA: Diagnosis not present

## 2022-02-14 DIAGNOSIS — Z961 Presence of intraocular lens: Secondary | ICD-10-CM | POA: Diagnosis not present

## 2022-02-14 DIAGNOSIS — Z79899 Other long term (current) drug therapy: Secondary | ICD-10-CM | POA: Diagnosis not present

## 2022-02-14 DIAGNOSIS — H524 Presbyopia: Secondary | ICD-10-CM | POA: Diagnosis not present

## 2022-02-14 DIAGNOSIS — H2512 Age-related nuclear cataract, left eye: Secondary | ICD-10-CM | POA: Diagnosis not present

## 2022-02-14 LAB — HM DIABETES EYE EXAM

## 2022-02-19 ENCOUNTER — Encounter: Payer: Self-pay | Admitting: Family Medicine

## 2022-02-24 ENCOUNTER — Ambulatory Visit: Payer: Medicare Other | Admitting: Physician Assistant

## 2022-02-25 ENCOUNTER — Ambulatory Visit: Payer: Medicare Other | Attending: Cardiovascular Disease | Admitting: Pharmacist

## 2022-02-25 ENCOUNTER — Encounter: Payer: Self-pay | Admitting: Pharmacist

## 2022-02-25 VITALS — BP 131/89 | HR 114

## 2022-02-25 DIAGNOSIS — I10 Essential (primary) hypertension: Secondary | ICD-10-CM | POA: Diagnosis not present

## 2022-02-25 MED ORDER — METOPROLOL SUCCINATE ER 100 MG PO TB24: ORAL_TABLET | ORAL | 1 refills | Status: DC

## 2022-02-25 MED ORDER — HYDRALAZINE HCL 25 MG PO TABS: 25.0000 mg | ORAL_TABLET | Freq: Two times a day (BID) | ORAL | 1 refills | Status: DC

## 2022-02-25 NOTE — Progress Notes (Signed)
Patient ID: [name]                 DOB: 1940-11-01                      MRN: [id number]      HPI: [name] is a 81 y.o. female referred by Dr. Gwenlyn Found to HTN clinic. PMH is significant for HTN, CHF, DVT/PE, interstitial lung disease, HLD, and obesity.  Patient presents today with son who she lives with. Son has changed his hours at work to help care for his mother. Expresses frustration that mother often will put the needs of others in front of her own needs. [place] and broke hand 3 weeks ago. Now on immediate release and extended release morphine. Son and patient do not know if falling was due to dizziness, low blood pressure, or low blood sugar.  Does not have much of an appetite and also reports she does not taste food as well. Son says she will go long times between meals. Trulicity may be contributory as well.  Brought home readings from past week: 146/92 128/87 129/83 138/90  Does not have a set schedule when she checks her blood pressure and medication schedule is also varied depending on when she wakes up.  Uses Omron upper arm cuff. Will sometimes skip furosemide if she knows she will have to leave the house.  Son believes she would benefit from a set schedule.   Current HTN meds:  Hydralazine 77m BID Furosemide 846mdaily Metoprolol succinate 12548maily Olmesartan 71m65mily Amlodipine 10mg20mly Metolazone 2.5mg p29mdaily  BP goal: <130/80  Wt Readings from Last 3 Encounters:  02/04/22 219 lb (99.3 kg)  02/01/22 222 lb (100.7 kg)  01/28/22 222 lb (100.7 kg)   BP Readings from Last 3 Encounters:  02/10/22 (!) 162/84  02/04/22 134/76  02/02/22 138/84   Pulse Readings from Last 3 Encounters:  02/10/22 (!) 107  02/04/22 94  02/02/22 97    Renal function: CrCl cannot be calculated (Patient's most recent lab result is older than the maximum 21 days allowed.).  Past Medical History:  Diagnosis Date   Acute on chronic diastolic (congestive) heart  failure (HCC)    Anemia    iron deficiency anemia - secondary to blood loss ( chronic)    Anxiety    Arthritis    endstage changes bilateral knees/bilateral ankles.    Asthma    Carotid artery occlusion    Chronic fatigue    Chronic kidney disease    Closed left hip fracture (HCC)    Clotting disorder (HCC)  [place] denies this   Contusion of left knee    due to fall 1/14.   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     Morbid obesity (HCC)  Bankseuromuscular disorder (HCC)  Hooperiabetic neuropathy    Normal coronary arteries  cardiac catheterization performed  10/31/14   Pneumonia    RA (rheumatoid arthritis) ([place])    has been off methotreaxte since 10/13.   Spinal stenosis of lumbar region    Thyroid disease    Unspecified lack of coordination    URI (upper respiratory infection)     Current Outpatient Medications on File Prior to Visit  Medication Sig Dispense Refill   acetaminophen (TYLENOL) 500 MG tablet Take 500-1,000 mg by mouth every 6 (six) hours as needed for moderate pain.     albuterol (PROVENTIL) (2.5 MG/3ML) 0.083% nebulizer solution Take 3 mLs (2.5 mg total) by nebulization every 6 (six) hours as needed for wheezing. 75 mL 0   allopurinol (ZYLOPRIM) 300 MG tablet Take 1 tablet (300 mg total) by mouth daily. 30 tablet 0   apixaban (ELIQUIS) 5 MG TABS tablet Take 1 tablet (5 mg total) by mouth 2 (two) times daily. 60 tablet 5   Artificial Tear Ointment (DRY EYES OP) Place 1 drop into both eyes 2 (two) times  daily as needed (dry eyes).     aspirin 81 MG chewable tablet Chew 1 tablet by mouth daily.     atorvastatin (LIPITOR) 20 MG tablet Take 0.5 tablets (10 mg total) by mouth every evening. (Patient taking differently: Take 20 mg by mouth every evening.) 15 tablet 0   azithromycin (ZITHROMAX) 250 MG tablet Take 250 mg by mouth 3 (three) times a week.     Calcium Carb-Cholecalciferol (CALCIUM + D3 PO) Take 2 tablets by mouth daily with lunch.      cephALEXin (KEFLEX) 500 MG capsule Take 1 capsule (500 mg total) by mouth 3 (three) times daily. 21 capsule 0   Cholecalciferol (VITAMIN D-3) 25 MCG (1000 UT) CAPS Take 2 capsules (2,000 Units total) by mouth daily. GIVE ONE TAB PO Q DAY 60 capsule 0   colchicine 0.6 MG tablet Take 1 tablet (0.6 mg total) by mouth daily as needed (gout flares). 30 tablet 0   cyclobenzaprine (FLEXERIL) 5 MG tablet Take 1 tablet (5 mg total) by mouth at bedtime as needed for muscle spasms. 90 tablet 1   diclofenac Sodium (VOLTAREN) 1 % GEL Apply 2 g topically 3 (three) times daily. Apply to both hands and knees 100 g 1   Dulaglutide (TRULICITY) 5.99 JT/7.0VX SOPN Inject 0.75 mg into the skin once a week. 6 mL 2   DULoxetine (CYMBALTA) 60 MG capsule Take 1 capsule (60 mg total) by mouth daily. 90 capsule 1   fluticasone (FLONASE) 50 MCG/ACT nasal spray Place 2 sprays into both nostrils daily. 16 g 3   furosemide (LASIX) 80 MG tablet Take 1 tablet (80 mg total) by mouth daily. 30 tablet 11   gabapentin (NEURONTIN) 600 MG tablet Take 2 tablets (1,200 mg total) by mouth 3 (three) times daily. 180 tablet 1   guaiFENesin (MUCINEX) 600 MG 12 hr tablet Take 2 tablets (1,200 mg total) by mouth 2 (two) times daily. 120 tablet 0   hydrALAZINE (APRESOLINE) 25 MG tablet Take 1 tablet (25 mg total) by mouth 2 (two) times daily. 180 tablet 3   hydrALAZINE (APRESOLINE) 25 MG tablet Take 1 tablet (25 mg total) by mouth 2 (two) times daily. 60 tablet 0   hydroxychloroquine (PLAQUENIL) 200 MG  tablet Take 1 tablet (200 mg total) by mouth daily. 30 tablet 0   leflunomide (ARAVA) 20 MG tablet Take 1 tablet (20 mg total) by mouth daily. 30 tablet 0   levothyroxine (SYNTHROID) 50 MCG  tablet Take 1 tablet (50 mcg total) by mouth daily. 90 tablet 3   linaclotide (LINZESS) 72 MCG capsule Take 1 capsule (72 mcg total) by mouth daily before breakfast. 30 capsule 3   metolazone (ZAROXOLYN) 2.5 MG tablet TAKE 1 TABLET BY MOUTH DAILY AS NEEDED FOR 2 DAYS FOR WEIGHT ABOVE 255 POUNDS 30 tablet 0   metoprolol succinate (TOPROL-XL) 100 MG 24 hr tablet Take 1 tablet by mouth daily.     metoprolol succinate (TOPROL-XL) 25 MG 24 hr tablet Take 5 tablets (125 mg total) by mouth daily. 150 tablet 3   morphine (MS CONTIN) 15 MG 12 hr tablet Take 1 tablet (15 mg total) by mouth every 12 (twelve) hours. Do Not Fill Before 03/02/2022 60 tablet 0   morphine (MSIR) 15 MG tablet Take 1 tablet (15 mg total) by mouth 2 (two) times daily. 60 tablet 0   Multiple Vitamin (MULTIVITAMIN WITH MINERALS) TABS Take 1 tablet by mouth daily.      Nintedanib (OFEV) 100 MG CAPS Take 1 capsule (100 mg total) by mouth 2 (two) times daily. (Patient not taking: Reported on 02/10/2022) 180 capsule 1   olmesartan (BENICAR) 40 MG tablet Take 1 tablet (40 mg total) by mouth daily. 90 tablet 3   OXYGEN-HELIUM IN Inhale 3 L into the lungs at bedtime. And on exertion     pantoprazole (PROTONIX) 40 MG tablet take 1 tablet by mouth once daily MINUTES BEFORE 1ST MEAL OF THE DAY 90 tablet 3   Potassium Chloride ER 20 MEQ TBCR Take 20 mEq by mouth daily. 30 tablet 0   predniSONE (DELTASONE) 10 MG tablet Take 1 tablet (10 mg total) by mouth daily with breakfast. 90 tablet 1   sodium chloride (OCEAN) 0.65 % SOLN nasal spray Place 1 spray into both nostrils as needed for congestion.     traMADol (ULTRAM) 50 MG tablet Take by mouth.     No current facility-administered medications on file prior to visit.    Allergies  Allergen Reactions    Codeine Swelling and Other (See Comments)    Facial swelling Chest pain and swelling in legs     Infliximab Anaphylaxis    (REMICADE) "sent me into shock"    Lisinopril Swelling    Face and neck swelling      Assessment/Plan:  1. Hypertension -  Patient BP in room 131/89 which is above goal of <130/80 however hesistant to be too aggressive since patient is a fall risk. She rpeorts she feels well.    Since son requests a schedule to keep her on track, advised patient should take morning medications and then check BP 2 hours after. Advised she should try not to skip meals. Advised if she does not have much appetite, she could try supplementing with nutritional drinks or Glucerna. Advised to reach out to PCP to see if she still needs Trulicity. Last A1c about a year ago. Is providing cardiac benefit but may be contributing to lack of appetite.  Currently on metoprolol 115m daily (five 234mtablets). Will refill her 10032mablets to reduce pill burden. Requests hydralazine refill as well.   Patient to begin more structured schedule of meals and checking BP at home. Will contact clinic if BP increases again.  Continue:  Hydralazine 42m35mice daily Furosemide 80mg47mly Metoprolol succinate 142mg 69my Olmesartan 40mg d42m Amlodipine 10mg da4mRecheck as needed  Chris PaKarren Cobble, BCACP, CMartins[place] [phone]  [phone]

## 2022-02-25 NOTE — Patient Instructions (Addendum)
It was nice meeting you today  Please continue your:  Hydralazine 69m twice daily Furosemide 869mdaily Metoprolol succinate 12518maily Olmesartan 4m60mily Amlodipine 10mg46mly  Try to take your blood pressure pills in the morning around the same time and then check your blood pressure 2 hours later  Check with Dr BanksVolanda Napoleonee if she still wants you on Trulicity since you do not have much of an appetite   I have refilled your medications for you  Please call with any questions  ChrisKarren CobblermD, BCACPMayESLuana 3PayetteteShelbynDardenne Pra[place] [id number]: [phone]: [phone]

## 2022-02-28 DIAGNOSIS — S62646A Nondisplaced fracture of proximal phalanx of right little finger, initial encounter for closed fracture: Secondary | ICD-10-CM | POA: Diagnosis not present

## 2022-03-01 ENCOUNTER — Emergency Department (HOSPITAL_BASED_OUTPATIENT_CLINIC_OR_DEPARTMENT_OTHER): Payer: Medicare Other | Admitting: Radiology

## 2022-03-01 ENCOUNTER — Telehealth: Payer: Self-pay | Admitting: Family Medicine

## 2022-03-01 ENCOUNTER — Emergency Department (HOSPITAL_BASED_OUTPATIENT_CLINIC_OR_DEPARTMENT_OTHER): Payer: Medicare Other

## 2022-03-01 ENCOUNTER — Other Ambulatory Visit: Payer: Self-pay

## 2022-03-01 ENCOUNTER — Emergency Department (HOSPITAL_BASED_OUTPATIENT_CLINIC_OR_DEPARTMENT_OTHER)
Admission: EM | Admit: 2022-03-01 | Discharge: 2022-03-01 | Disposition: A | Discharge status: Home / Self Care | Payer: Medicare Other | Attending: Emergency Medicine | Admitting: Emergency Medicine

## 2022-03-01 DIAGNOSIS — N200 Calculus of kidney: Secondary | ICD-10-CM | POA: Diagnosis not present

## 2022-03-01 DIAGNOSIS — Z79899 Other long term (current) drug therapy: Secondary | ICD-10-CM | POA: Insufficient documentation

## 2022-03-01 DIAGNOSIS — Z7901 Long term (current) use of anticoagulants: Secondary | ICD-10-CM | POA: Diagnosis not present

## 2022-03-01 DIAGNOSIS — N289 Disorder of kidney and ureter, unspecified: Secondary | ICD-10-CM | POA: Diagnosis not present

## 2022-03-01 DIAGNOSIS — R1030 Lower abdominal pain, unspecified: Secondary | ICD-10-CM | POA: Insufficient documentation

## 2022-03-01 DIAGNOSIS — R1031 Right lower quadrant pain: Secondary | ICD-10-CM | POA: Diagnosis not present

## 2022-03-01 DIAGNOSIS — R109 Unspecified abdominal pain: Secondary | ICD-10-CM | POA: Diagnosis present

## 2022-03-01 DIAGNOSIS — M25511 Pain in right shoulder: Secondary | ICD-10-CM | POA: Diagnosis not present

## 2022-03-01 LAB — URINALYSIS, ROUTINE W REFLEX MICROSCOPIC
Bilirubin Urine: NEGATIVE
Glucose, UA: NEGATIVE mg/dL
Hgb urine dipstick: NEGATIVE
Ketones, ur: NEGATIVE mg/dL
Leukocytes,Ua: NEGATIVE
Nitrite: NEGATIVE
Protein, ur: NEGATIVE mg/dL
Specific Gravity, Urine: 1.031 — ABNORMAL HIGH (ref 1.005–1.030)
pH: 6.5 (ref 5.0–8.0)

## 2022-03-01 LAB — CBC
HCT: 39.1 % (ref 36.0–46.0)
Hemoglobin: 12 g/dL (ref 12.0–15.0)
MCH: 29.7 pg (ref 26.0–34.0)
MCHC: 30.7 g/dL (ref 30.0–36.0)
MCV: 96.8 fL (ref 80.0–100.0)
Platelets: 144 10*3/uL — ABNORMAL LOW (ref 150–400)
RBC: 4.04 MIL/uL (ref 3.87–5.11)
RDW: 13.7 % (ref 11.5–15.5)
WBC: 6.8 10*3/uL (ref 4.0–10.5)
nRBC: 0 % (ref 0.0–0.2)

## 2022-03-01 LAB — COMPREHENSIVE METABOLIC PANEL
ALT: 21 U/L (ref 0–44)
AST: 15 U/L (ref 15–41)
Albumin: 3.6 g/dL (ref 3.5–5.0)
Alkaline Phosphatase: 81 U/L (ref 38–126)
Anion gap: 7 (ref 5–15)
BUN: 14 mg/dL (ref 8–23)
CO2: 34 mmol/L — ABNORMAL HIGH (ref 22–32)
Calcium: 9 mg/dL (ref 8.9–10.3)
Chloride: 100 mmol/L (ref 98–111)
Creatinine, Ser: 0.72 mg/dL (ref 0.44–1.00)
GFR, Estimated: >60 mL/min (ref >60)
Glucose, Bld: 221 mg/dL — ABNORMAL HIGH (ref 70–99)
Potassium: 2.8 mmol/L — ABNORMAL LOW (ref 3.5–5.1)
Sodium: 141 mmol/L (ref 135–145)
Total Bilirubin: 0.5 mg/dL (ref 0.3–1.2)
Total Protein: 6.1 g/dL — ABNORMAL LOW (ref 6.5–8.1)

## 2022-03-01 LAB — LIPASE, BLOOD: Lipase: 75 U/L — ABNORMAL HIGH (ref 11–51)

## 2022-03-01 MED ORDER — POTASSIUM CHLORIDE CRYS ER 20 MEQ PO TBCR
40.0000 meq | EXTENDED_RELEASE_TABLET | Freq: Once | ORAL | Status: AC
  Administered 2022-03-01: 40 meq via ORAL
  Filled 2022-03-01: qty 2

## 2022-03-01 MED ORDER — SODIUM CHLORIDE 0.9 % IV SOLN: INTRAVENOUS | Status: DC

## 2022-03-01 MED ORDER — ONDANSETRON HCL 4 MG/2ML IJ SOLN
4.0000 mg | Freq: Once | INTRAMUSCULAR | Status: AC
  Administered 2022-03-01: 4 mg via INTRAVENOUS
  Filled 2022-03-01: qty 2

## 2022-03-01 MED ORDER — MORPHINE SULFATE (PF) 2 MG/ML IV SOLN
2.0000 mg | Freq: Once | INTRAVENOUS | Status: AC
  Administered 2022-03-01: 2 mg via INTRAVENOUS
  Filled 2022-03-01: qty 1

## 2022-03-01 MED ORDER — ONDANSETRON 4 MG PO TBDP: 4.0000 mg | ORAL_TABLET | Freq: Three times a day (TID) | ORAL | 1 refills | Status: DC | PRN

## 2022-03-01 MED ORDER — IOHEXOL 300 MG/ML  SOLN
100.0000 mL | Freq: Once | INTRAMUSCULAR | Status: AC | PRN
  Administered 2022-03-01: 100 mL via INTRAVENOUS

## 2022-03-01 NOTE — ED Triage Notes (Signed)
Patient arrives with complaints of worsening abdominal pain x1 week.  Patient recently had some adjustments made to her diabetic medications and her eliquis. The adjustments have been making her not feel well, per patient. Accompanied by family.   Patient recently had a fall as well. Would like imaging done her right shoulder due to pain.  Patient is on oxygen at baseline.

## 2022-03-01 NOTE — Telephone Encounter (Signed)
Health on-call service calling to let me know that Ms [name] is concerned because Ms [name] BS's are fluctuating,she is lethargic, "difficult" to wake her up. Call service recommend taking pt to the ED but apparently her son has refused. She has hx of ILD,DM II,HTN,RA on Arava, hx of PE on chronic anticoagulation, and CHF. Based on information that has been provided to me and given her comorbilities ,I agreed with recommendation, Ms [name] needs to be evaluated in the ED. Schneur Crowson [place], MD

## 2022-03-01 NOTE — Discharge Instructions (Addendum)
Take the Zofran dissolvable tablet as needed for nausea and vomiting.  Take your extended release morphine as needed for pain.  Make an appointment to follow-up with your primary care doctor as well make an appointment to follow-up with gastroenterology for them to evaluate this concern of may be the sigmoid colon being kind of attached to the bladder.  Not so sure if that has anything to do with today's symptoms.  But no acute problem that needs to be addressed.  Urinalysis is normal here today which is reassuring no signs of urinary tract infection.  Return for any new or worse symptoms.

## 2022-03-01 NOTE — ED Provider Notes (Signed)
[facility] EMERGENCY DEPT Provider Note   CSN: [id number] Arrival date & time: 03/01/22  1416     History  Chief Complaint  Patient presents with   Abdominal Pain   Emesis    [name] is a 81 y.o. female.  Patient presenting with a complaint of lower quadrant abdominal pain that started at 2300 yesterday.  Associated with vomiting nausea no diarrhea did have some chills.  Patient's had a hysterectomy in the past and C-section and has had her appendix removed.  Patient denies any vomiting of blood.  No diarrhea.  Patient is on chronic pain medicine extended release morphine.  Past medical history significant for thyroid disease hypertension high cholesterol rheumatoid arthritis COPD chronic kidney disease diabetes diabetic neuropathy acute on chronic diastolic heart failure.  Also patient had a fall a week ago within evaluation but she still has pain to her right shoulder area.  Nursing ordered shoulder x-ray which shows no acute bony abnormality.  Patient also was at her hand surgeon yesterday from that same fall where she has fracture of her fingers that they are following and he mention that shoulder pain and he provided her referral within orthopedic group.       Home Medications Prior to Admission medications   Medication Sig Start Date End Date Taking? Authorizing Provider  ondansetron (ZOFRAN-ODT) 4 MG disintegrating tablet Take 1 tablet (4 mg total) by mouth every 8 (eight) hours as needed for nausea or vomiting. 03/01/22  Yes Fredia Sorrow, MD  acetaminophen (TYLENOL) 500 MG tablet Take 500-1,000 mg by mouth every 6 (six) hours as needed for moderate pain.    [provider]  albuterol (PROVENTIL) (2.5 MG/3ML) 0.083% nebulizer solution Take 3 mLs (2.5 mg total) by nebulization every 6 (six) hours as needed for wheezing. 05/03/21   Medina-Vargas, Monina C, NP  allopurinol (ZYLOPRIM) 300 MG tablet Take 1 tablet (300 mg total) by mouth daily.  05/03/21   Medina-Vargas, Monina C, NP  amLODipine (NORVASC) 10 MG tablet Take 10 mg by mouth daily. 02/12/22   [provider]  apixaban (ELIQUIS) 5 MG TABS tablet Take 1 tablet (5 mg total) by mouth 2 (two) times daily. 11/27/21   Lorretta Harp, MD  Artificial Tear Ointment (DRY EYES OP) Place 1 drop into both eyes 2 (two) times daily as needed (dry eyes).    [provider]  aspirin 81 MG chewable tablet Chew 1 tablet by mouth daily.    [provider]  atorvastatin (LIPITOR) 20 MG tablet Take 0.5 tablets (10 mg total) by mouth every evening. Patient taking differently: Take 20 mg by mouth every evening. 12/09/21   [name], MD  azithromycin (ZITHROMAX) 250 MG tablet Take 250 mg by mouth 3 (three) times a week. 11/23/21   [provider]  Calcium Carb-Cholecalciferol (CALCIUM + D3 PO) Take 2 tablets by mouth daily with lunch.     [provider]  cephALEXin (KEFLEX) 500 MG capsule Take 1 capsule (500 mg total) by mouth 3 (three) times daily. 02/02/22   Horton, Barbette Hair, MD  Cholecalciferol (VITAMIN D-3) 25 MCG (1000 UT) CAPS Take 2 capsules (2,000 Units total) by mouth daily. GIVE ONE TAB PO Q DAY 05/03/21   Medina-Vargas, Monina C, NP  colchicine 0.6 MG tablet Take 1 tablet (0.6 mg total) by mouth daily as needed (gout flares). 05/03/21   Medina-Vargas, Monina C, NP  cyclobenzaprine (FLEXERIL) 5 MG tablet Take 1 tablet (5 mg total) by  mouth at bedtime as needed for muscle spasms. 12/02/21   [name], NP  diclofenac Sodium (VOLTAREN) 1 % GEL Apply 2 g topically 3 (three) times daily. Apply to both hands and knees 05/03/21   Medina-Vargas, Monina C, NP  Dulaglutide (TRULICITY) 8.88 BV/6.9IH SOPN Inject 0.75 mg into the skin once a week. 12/09/21   [name], MD  DULoxetine (CYMBALTA) 60 MG capsule Take 1 capsule (60 mg total) by mouth daily. 12/02/21   [name], NP  fluticasone (FLONASE) 50 MCG/ACT nasal spray Place 2 sprays  into both nostrils daily. 12/09/21   [name], MD  furosemide (LASIX) 80 MG tablet Take 1 tablet (80 mg total) by mouth daily. 08/02/21   Lorretta Harp, MD  gabapentin (NEURONTIN) 600 MG tablet Take 2 tablets (1,200 mg total) by mouth 3 (three) times daily. 12/02/21   [name], NP  guaiFENesin (MUCINEX) 600 MG 12 hr tablet Take 2 tablets (1,200 mg total) by mouth 2 (two) times daily. 05/03/21   Medina-Vargas, Monina C, NP  hydrALAZINE (APRESOLINE) 25 MG tablet Take 1 tablet (25 mg total) by mouth 2 (two) times daily. 02/25/22   Lorretta Harp, MD  hydroxychloroquine (PLAQUENIL) 200 MG tablet Take 1 tablet (200 mg total) by mouth daily. 05/03/21   Medina-Vargas, Monina C, NP  leflunomide (ARAVA) 20 MG tablet Take 1 tablet (20 mg total) by mouth daily. 05/03/21 08/09/23  Medina-Vargas, Monina C, NP  levothyroxine (SYNTHROID) 50 MCG tablet Take 1 tablet (50 mcg total) by mouth daily. 09/25/21   [name], MD  linaclotide Rolan Lipa) 72 MCG capsule Take 1 capsule (72 mcg total) by mouth daily before breakfast. 12/09/21   [name], MD  metolazone (ZAROXOLYN) 2.5 MG tablet TAKE 1 TABLET BY MOUTH DAILY AS NEEDED FOR 2 DAYS FOR WEIGHT ABOVE 255 POUNDS 05/03/21   Medina-Vargas, Monina C, NP  metoprolol succinate (TOPROL-XL) 100 MG 24 hr tablet Take 1 tablet by mouth once daily along with 1 metoprolol 35m tablet 02/25/22   BLorretta Harp MD  metoprolol succinate (TOPROL-XL) 25 MG 24 hr tablet Take 5 tablets (125 mg total) by mouth daily. 12/09/21   BBillie Ruddy MD  morphine (MS CONTIN) 15 MG 12 hr tablet Take 1 tablet (15 mg total) by mouth every 12 (twelve) hours. Do Not Fill Before 03/02/2022 01/28/22   TBayard Hugger NP  morphine (MSIR) 15 MG tablet Take 1 tablet (15 mg total) by mouth 2 (two) times daily. 01/28/22   TBayard Hugger NP  Multiple Vitamin (MULTIVITAMIN WITH MINERALS) TABS Take 1 tablet by mouth daily.     [provider]  Nintedanib (OFEV) 100  MG CAPS Take 1 capsule (100 mg total) by mouth 2 (two) times daily. Patient not taking: Reported on 02/10/2022 09/18/21   RBrand Males MD  olmesartan (BENICAR) 40 MG tablet Take 1 tablet (40 mg total) by mouth daily. 02/10/22   BBillie Ruddy MD  OXYGEN-HELIUM IN Inhale 3 L into the lungs at bedtime. And on exertion    [provider]  pantoprazole (PROTONIX) 40 MG tablet take 1 tablet by mouth once daily MINUTES BEFORE 1ST MEAL OF THE DAY 09/25/21   BBillie Ruddy MD  Potassium Chloride ER 20 MEQ TBCR Take 20 mEq by mouth daily. 01/08/22   BBillie Ruddy MD  predniSONE (DELTASONE) 10 MG tablet Take 1 tablet (10 mg total) by mouth daily with breakfast. 05/22/21   BVolanda Napoleon  Langley Adie, MD  sodium chloride (OCEAN) 0.65 % SOLN nasal spray Place 1 spray into both nostrils as needed for congestion.    [provider]  traMADol (ULTRAM) 50 MG tablet Take by mouth.    [provider]      Allergies    Codeine, Infliximab, and Lisinopril    Review of Systems   Review of Systems  Constitutional:  Negative for chills and fever.  HENT:  Negative for ear pain and sore throat.   Eyes:  Negative for pain and visual disturbance.  Respiratory:  Negative for cough and shortness of breath.   Cardiovascular:  Negative for chest pain and palpitations.  Gastrointestinal:  Positive for abdominal pain, nausea and vomiting. Negative for diarrhea.  Genitourinary:  Negative for dysuria and hematuria.  Musculoskeletal:  Negative for arthralgias and back pain.  Skin:  Negative for color change and rash.  Neurological:  Negative for seizures and syncope.  All other systems reviewed and are negative.   Physical Exam Updated Vital Signs BP 134/87 (BP Location: Right Arm)   Pulse 89   Temp 98.5 F (36.9 C) (Oral)   Resp 16   Ht 1.626 m (_0 )   Wt 99.3 kg   SpO2 97%   BMI 37.58 kg/m  Physical Exam Vitals and nursing note reviewed.  Constitutional:      General: She is not  in acute distress.    Appearance: She is well-developed.  HENT:     Head: Normocephalic and atraumatic.  Eyes:     Extraocular Movements: Extraocular movements intact.     Conjunctiva/sclera: Conjunctivae normal.     Pupils: Pupils are equal, round, and reactive to light.  Cardiovascular:     Rate and Rhythm: Normal rate and regular rhythm.     Heart sounds: No murmur heard. Pulmonary:     Effort: Pulmonary effort is normal. No respiratory distress.     Breath sounds: Normal breath sounds.  Abdominal:     General: Bowel sounds are normal. There is no distension.     Palpations: Abdomen is soft.     Tenderness: There is no abdominal tenderness. There is no guarding.     Hernia: No hernia is present.  Musculoskeletal:        General: No swelling.     Cervical back: Neck supple.  Skin:    General: Skin is warm and dry.     Capillary Refill: Capillary refill takes less than 2 seconds.  Neurological:     General: No focal deficit present.     Mental Status: She is alert and oriented to person, place, and time.     Cranial Nerves: No cranial nerve deficit.     Motor: No weakness.  Psychiatric:        Mood and Affect: Mood normal.     ED Results / Procedures / Treatments   Labs (all labs ordered are listed, but only abnormal results are displayed) Labs Reviewed  LIPASE, BLOOD - Abnormal; Notable for the following components:      Result Value   Lipase 75 (*)    All other components within normal limits  COMPREHENSIVE METABOLIC PANEL - Abnormal; Notable for the following components:   Potassium 2.8 (*)    CO2 34 (*)    Glucose, Bld 221 (*)    Total Protein 6.1 (*)    All other components within normal limits  CBC - Abnormal; Notable for the following components:   Platelets 144 (*)  All other components within normal limits  URINALYSIS, ROUTINE W REFLEX MICROSCOPIC - Abnormal; Notable for the following components:   Specific Gravity, Urine 1.031 (*)    All other  components within normal limits    EKG None  Radiology CT Abdomen Pelvis W Contrast  Result Date: 03/01/2022 CLINICAL DATA:  Nonlocalized abdominal pain. EXAM: CT ABDOMEN AND PELVIS WITH CONTRAST TECHNIQUE: Multidetector CT imaging of the abdomen and pelvis was performed using the standard protocol following bolus administration of intravenous contrast. RADIATION DOSE REDUCTION: This exam was performed according to the departmental dose-optimization program which includes automated exposure control, adjustment of the mA and/or kV according to patient size and/or use of iterative reconstruction technique. CONTRAST:  132m OMNIPAQUE IOHEXOL 300 MG/ML  SOLN COMPARISON:  03/31/2021 FINDINGS: Lower chest: Cylindrical bronchiectasis with parenchymal scarring noted in both lung bases. Hepatobiliary: No suspicious focal abnormality within the liver parenchyma. There is no evidence for gallstones, gallbladder wall thickening, or pericholecystic fluid. No intrahepatic or extrahepatic biliary dilation. Pancreas: No focal mass lesion. No dilatation of the main duct. No intraparenchymal cyst. No peripancreatic edema. Spleen: No splenomegaly. No focal mass lesion. Adrenals/Urinary Tract: No adrenal nodule or mass. 5.7 cm cyst in the interpolar right kidney is minimally increased in the interval. 2 mm nonobstructing interpolar right renal stone again noted. Central sinus cyst noted right kidney. Left kidney appears atrophic. Tiny hypoattenuating lesions in both kidneys are too small to characterize but likely benign. No evidence for hydroureter. The urinary bladder appears normal for the degree of distention. Stomach/Bowel: Tiny hiatal hernia. Stomach otherwise unremarkable. Duodenum is normally positioned as is the ligament of Treitz. No small bowel wall thickening. No small bowel dilatation. The terminal ileum is normal. The appendix is not well visualized, but there is no edema or inflammation in the region of the  cecum. Diverticular changes are noted in the left colon. Sigmoid colon is tethered to the left ovary and the dome of the urinary bladder (axial image 66/2, coronal image 54/5, and sagittal image 80/6. There is bladder wall thickening focally in the region where it is tethered to the sigmoid colon. Vascular/Lymphatic: There is mild atherosclerotic calcification of the abdominal aorta without aneurysm. There is no gastrohepatic or hepatoduodenal ligament lymphadenopathy. No retroperitoneal or mesenteric lymphadenopathy. No pelvic sidewall lymphadenopathy. Reproductive: Uterus surgically absent.  There is no adnexal mass. Other: No intraperitoneal free fluid. Musculoskeletal: Bones are diffusely demineralized. IMPRESSION: 1. No acute findings in the abdomen or pelvis. Specifically, no findings to explain the patient's history of abdominal pain. 2. Sigmoid colon is tethered to the left ovary and the dome of the urinary bladder. There is bladder wall thickening focally in the region where it is tethered to the sigmoid colon. Imaging features raise concern for colovesical fistula. 3. 2 mm nonobstructing interpolar right renal stone. 4. Tiny hiatal hernia. 5. Cylindrical bronchiectasis with parenchymal scarring in both lung bases. 6.  Aortic Atherosclerosis (ICD10-I70.0). Electronically Signed   By: EMisty StanleyM.D.   On: 03/01/2022 17:36   DG Shoulder Right Port  Result Date: 03/01/2022 CLINICAL DATA:  Right shoulder pain, fall EXAM: RIGHT SHOULDER - 1 VIEW COMPARISON:  None Available. FINDINGS: There is no evidence of fracture or dislocation. Mild degenerative changes of the glenohumeral and acromioclavicular joints. Soft tissues are unremarkable. IMPRESSION: Negative. Electronically Signed   By: NDavina PokeD.O.   On: 03/01/2022 15:22    Procedures Procedures    Medications Ordered in ED Medications  0.9 %  sodium chloride  infusion ( Intravenous Stopped 03/01/22 1719)  ondansetron (ZOFRAN) injection  4 mg (4 mg Intravenous Given 03/01/22 1650)  iohexol (OMNIPAQUE) 300 MG/ML solution 100 mL (100 mLs Intravenous Contrast Given 03/01/22 1708)  ondansetron (ZOFRAN) injection 4 mg (4 mg Intravenous Given 03/01/22 1753)  morphine (PF) 2 MG/ML injection 2 mg (2 mg Intravenous Given 03/01/22 1753)  potassium chloride SA (KLOR-CON M) CR tablet 40 mEq (40 mEq Oral Given 03/01/22 1949)    ED Course/ Medical Decision Making/ A&P                           Medical Decision Making Amount and/or Complexity of Data Reviewed Labs: ordered. Radiology: ordered.  Risk Prescription drug management.   Work-up urinalysis negative urine somewhat concentrated.  Lipase 75 complete metabolic panel potassium 2.8.  Patient tells me she is normally on 20 mEq of potassium daily did not take it today.  Blood sugar 221 CO2 34 renal function normal with GFR greater than 60.  CBC no leukocytosis hemoglobin 12 platelets 144.  CT abdomen pelvis no acute findings in the abdomen or pelvis there is sigmoid colon is tethered to the left ovary in the dome of the urinary bladder this bladder wall thickening focally tethered to the sigmoid images features raising concern for colovesicular fistula.  But patient's urinalysis is completely normal.  Patient will follow-up with orthopedics for the shoulder pain.  I will have her follow back up with her primary care doctor.  I will have her also follow-up with GI medicine.  She is followed by [facility].  Patient's shoulder examination no obvious deformity some pain with range of motion.  Radial pulse distally 2+ sensation and intact little difficult to assess because her last 2 fingers 4 and 5 are splinted.  Symptoms could be consistent with rotator cuff injury but she is got follow-up arranged.  Patient has long-term pain medicine at home will give a prescription for Zofran.  Patient improved here significantly with IV pain medicine and antinausea medicine.   Final Clinical  Impression(s) / ED Diagnoses Final diagnoses:  Lower abdominal pain  Acute pain of right shoulder    Rx / DC Orders ED Discharge Orders          Ordered    ondansetron (ZOFRAN-ODT) 4 MG disintegrating tablet  Every 8 hours PRN        03/01/22 2101              Fredia Sorrow, MD 03/01/22 2107

## 2022-03-04 ENCOUNTER — Telehealth: Payer: Medicare Other | Admitting: Nurse Practitioner

## 2022-03-05 ENCOUNTER — Encounter: Payer: Self-pay | Admitting: Family Medicine

## 2022-03-05 ENCOUNTER — Ambulatory Visit (INDEPENDENT_AMBULATORY_CARE_PROVIDER_SITE_OTHER): Payer: Medicare Other | Admitting: Family Medicine

## 2022-03-05 VITALS — BP 112/68 | HR 93 | Temp 97.6°F

## 2022-03-05 DIAGNOSIS — G8929 Other chronic pain: Secondary | ICD-10-CM | POA: Diagnosis not present

## 2022-03-05 DIAGNOSIS — R9389 Abnormal findings on diagnostic imaging of other specified body structures: Secondary | ICD-10-CM | POA: Diagnosis not present

## 2022-03-05 DIAGNOSIS — M25511 Pain in right shoulder: Secondary | ICD-10-CM

## 2022-03-05 DIAGNOSIS — I700 Atherosclerosis of aorta: Secondary | ICD-10-CM

## 2022-03-05 DIAGNOSIS — I10 Essential (primary) hypertension: Secondary | ICD-10-CM

## 2022-03-05 NOTE — Progress Notes (Signed)
Subjective:    Patient ID: [name], female    DOB: May 30, 1940, 81 y.o.   MRN: [id number]  Chief Complaint  Patient presents with   Follow-up    ED for stomach pain.  Pt accompanied by her daughter.  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  Seen on 03/01/2022 for lower abdominal pain.  Lipase elevated at 75, potassium 2.8 blood sugar 221.  CT abdomen pelvis no acute findings, concern for colovesicular fistula on CT but UA normal. Has appt with GI.  Patient also noted to have right shoulder pain, but advised to follow-up with Ortho.  Pt states pain in R shoulder wakes her up at 2 am.  Unable to raise R arm above head  a pulling sensation noted in arm/shoulder with certain movements.  Lidocaine patches help for a few hrs.  Using heat.  Pt also with continued R upper arm pain shooting to elbow and hand.  Has GI appt for lower abd pain and CT results.  Inquires if appt can be moved up.  No emesis.  Past Medical History:  Diagnosis Date   Acute on chronic diastolic (congestive) heart failure (HCC)    Anemia    iron deficiency anemia - secondary to blood loss ( chronic)    Anxiety    Arthritis    endstage changes bilateral knees/bilateral ankles.    Asthma    Carotid artery occlusion    Chronic fatigue    Chronic kidney disease    Closed left hip fracture (HCC)    Clotting disorder ([place])    pt denies this   Contusion of left knee    due to fall 1/14.   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     Morbid obesity ([place])    Neuromuscular disorder ([place])    diabetic neuropathy    Normal coronary arteries    cardiac catheterization performed  10/31/14   Pneumonia    RA (rheumatoid arthritis) ([place])    has been off methotreaxte since 10/13.   Spinal stenosis of lumbar region    Thyroid disease    Unspecified lack of coordination    URI (upper respiratory infection)     Allergies  Allergen Reactions   Codeine Swelling and Other (See Comments)    Facial swelling Chest pain and swelling in legs     Infliximab Anaphylaxis    ([place]) "sent me into shock"    Lisinopril Swelling    Face and neck swelling     ROS General: Denies fever, chills, night sweats, changes in  weight, changes in appetite HEENT: Denies headaches, ear pain, changes in vision, rhinorrhea, sore throat CV: Denies CP, palpitations, SOB, orthopnea Pulm: Denies SOB, cough, wheezing GI: Denies abdominal pain, nausea, vomiting, diarrhea, constipation  +lower abd pain GU: Denies dysuria, hematuria, frequency, vaginal discharge Msk: Denies muscle cramps, joint pains  +R shoulder pain, R upper arm pain with radiation into hand. Neuro: Denies weakness, numbness, tingling Skin: Denies rashes, bruising Psych: Denies depression, anxiety, hallucinations   Objective:    Blood pressure 112/68, pulse 93, temperature 97.6 F (36.4 C), temperature source Oral, SpO2 92 %.  Gen. Pleasant, well-nourished, in no distress, normal affect   HEENT: [place]/AT, face symmetric, conjunctiva clear, no scleral icterus, PERRLA, EOMI, nares patent without  drainage. Lungs: no accessory muscle use, CTAB, no wheezes or rales Cardiovascular: RRR, no m/r/g, no peripheral edema Abdomen: BS present, soft, NT/ND. Musculoskeletal: R shoulder with decreased ROM to 85 degrees active and passive motion.  No deformities, no cyanosis or clubbing, normal tone Neuro:  A&Ox3, CN II-XII intact, sitting in transport wheelchair. Skin:  Warm, no lesions/ rash   Wt Readings from Last 3 Encounters:  03/01/22 218 lb 14.7 oz (99.3 kg)  02/04/22 219 lb (99.3 kg)  02/01/22 222 lb (100.7 kg)    Lab Results  Component Value Date   WBC 6.8 03/01/2022   HGB 12.0 03/01/2022   HCT 39.1 03/01/2022   PLT 144 (L) 03/01/2022   GLUCOSE 221 (H) 03/01/2022   CHOL 136 09/26/2020   TRIG 283.0 (H) 09/26/2020   HDL 40.00 09/26/2020   LDLDIRECT 59.0 09/26/2020   LDLCALC 75 09/22/2019   ALT 21 03/01/2022   AST 15 03/01/2022   NA 141 03/01/2022   K 2.8 (L) 03/01/2022   CL 100 03/01/2022   CREATININE 0.72 03/01/2022   BUN 14 03/01/2022   CO2 34 (H) 03/01/2022   TSH 1.33 09/25/2021   INR 1.2 02/01/2022   HGBA1C 6.5 (H) 04/02/2021    Assessment/Plan:  Chronic right shoulder pain  -discussed possible cause of pain including rotator cuff tear, arthritis, frozen shoulder. -thrombosis less likely as on Eliquis and u/s RUE negative on  -continue chronic pain med per pain management.   -continue supportive care with heat, lidocaine patches, stretching, etc. -obtain MR as Xray R sholulder 03/01/22 negative. -referral to Ortho - Plan: MR Shoulder Right Wo Contrast, Ambulatory referral to Orthopedic Surgery  Essential hypertension -controlled today in clinic -continue Hydralazine 25 mg BID, norvasc 10 mg, lasix 80 mg, toprol xl 125 mg, olmesartan 40 mg -continue lifestyle modifications  Aortic atherosclerosis ([place]) -Noted on CT abdomen pelvis 03/01/2022 -continue lipitor 20 mg, take half tab  Abnormal CT scan -CT abdomen pelvis 03/01/2022 with pt of acute findings  in abdomen pelvis to explain patient's abdominal pain.  Sigmoid colon tethered to left ovary and dome of urinary bladder.  Bladder wall thickening where tethered to sigmoid colon concerning for colovesical fistula.  2 mm nonobstructing interpolar right renal stone.  Tiny hiatal hernia.  Cylindrical bronchiectasis with parenchymal scarring in both lung bases.  Aortic atherosclerosis. -5.7 cm cyst in the interpolar right kidney minimally increased in size.  Central sinus cyst noted in right kidney.  Left kidney appears atrophic.  Tiny hypoattenuating lesions in both kidneys are too small to characterize but likely benign. -Patient encouraged to keep follow-up appointment with GI.   F/u prn  Grier Mitts, MD

## 2022-03-05 NOTE — Patient Instructions (Signed)
Right arm injury could be due to adhesive capsulitis or rotator cuff injury.  Referral was placed for you to have your MRI.  You should expect a phone call about scheduling this.  Have included information about adhesive capsulitis and rotator cuff injury for you to read over.  A referral was also placed for you to see the orthopedic surgeons.  We will see if we can get a sooner GI appointment.

## 2022-03-06 ENCOUNTER — Encounter: Payer: Self-pay | Admitting: Neurology

## 2022-03-06 ENCOUNTER — Ambulatory Visit (INDEPENDENT_AMBULATORY_CARE_PROVIDER_SITE_OTHER): Payer: Medicare Other | Admitting: Neurology

## 2022-03-06 VITALS — BP 137/79 | HR 98 | Ht 64.0 in | Wt 224.0 lb

## 2022-03-06 DIAGNOSIS — E519 Thiamine deficiency, unspecified: Secondary | ICD-10-CM | POA: Diagnosis not present

## 2022-03-06 DIAGNOSIS — I7091 Generalized atherosclerosis: Secondary | ICD-10-CM

## 2022-03-06 DIAGNOSIS — M25511 Pain in right shoulder: Secondary | ICD-10-CM | POA: Diagnosis not present

## 2022-03-06 DIAGNOSIS — E538 Deficiency of other specified B group vitamins: Secondary | ICD-10-CM

## 2022-03-06 DIAGNOSIS — I679 Cerebrovascular disease, unspecified: Secondary | ICD-10-CM | POA: Diagnosis not present

## 2022-03-06 DIAGNOSIS — R4189 Other symptoms and signs involving cognitive functions and awareness: Secondary | ICD-10-CM | POA: Diagnosis not present

## 2022-03-06 MED ORDER — DONEPEZIL HCL 5 MG PO TABS: 5.0000 mg | ORAL_TABLET | Freq: Every day | ORAL | 3 refills | Status: DC

## 2022-03-06 NOTE — Patient Instructions (Addendum)
Start Donepezil - memory medication Fromal memory testing with Dr. Alphonzo [place] Blood work F/u 6 months OT for cognitive therapy   Memory Compensation Strategies  Use "WARM" strategy.  W= write it down  A= associate it  R= repeat it  M= make a mental note  2.   You can keep a Social worker.  Use a 3-ring notebook with sections for the following: calendar, important names and phone numbers,  medications, doctors' names/phone numbers, lists/reminders, and a section to journal what you did  each day.   3.    Use a calendar to write appointments down.  4.    Write yourself a schedule for the day.  This can be placed on the calendar or in a separate section of the Memory Notebook.  Keeping a  regular schedule can help memory.  5.    Use medication organizer with sections for each day or morning/evening pills.  You may need help loading it  6.    Keep a basket, or pegboard by the door.  Place items that you need to take out with you in the basket or on the pegboard.  You may also want to  include a message board for reminders.  7.    Use sticky notes.  Place sticky notes with reminders in a place where the task is performed.  For example: " turn off the  stove" placed by the stove, "lock the door" placed on the door at eye level, " take your medications" on  the bathroom mirror or by the place where you normally take your medications.  8.    Use alarms/timers.  Use while cooking to remind yourself to check on food or as a reminder to take your medicine, or as a  reminder to make a call, or as a reminder to perform another task, etc.   Donepezil Tablets What is this medication? DONEPEZIL (doe NEP e zil) treats memory loss and confusion (dementia) in people who have Alzheimer disease. It works by improving attention, memory, and the ability to engage in daily activities. It is not a cure for dementia or Alzheimer disease. This medicine may be used for other purposes; ask  your health care provider or pharmacist if you have questions. COMMON BRAND NAME(S): Aricept What should I tell my care team before I take this medication? They need to know if you have any of these conditions: Asthma or other lung disease Difficulty passing urine Head injury Heart disease History of irregular heartbeat Liver disease Seizures (convulsions) Stomach or intestinal disease, ulcers or stomach bleeding An unusual or allergic reaction to donepezil, other medications, foods, dyes, or preservatives Pregnant or trying to get pregnant Breast-feeding How should I use this medication? Take this medication by mouth with a glass of water. Follow the directions on the prescription label. You may take this medication with or without food. Take this medication at regular intervals. This medication is usually taken before bedtime. Do not take it more often than directed. Continue to take your medication even if you feel better. Do not stop taking except on your care team's advice. If you are taking the 23 mg donepezil tablet, swallow it whole; do not cut, crush, or chew it. Talk to your care team about the use of this medication in children. Special care may be needed. Overdosage: If you think you have taken too much of this medicine contact a poison control center or emergency room at once. NOTE: This medicine is  only for you. Do not share this medicine with others. What if I miss a dose? If you miss a dose, take it as soon as you can. If it is almost time for your next dose, take only that dose, do not take double or extra doses. What may interact with this medication? Do not take this medication with any of the following: Certain medications for fungal infections like itraconazole, fluconazole, posaconazole, and voriconazole Cisapride Dextromethorphan; quinidine Dronedarone Pimozide Quinidine Thioridazine This medication may also interact with the following: Antihistamines for  allergy, cough and cold Atropine Bethanechol Carbamazepine Certain medications for bladder problems like oxybutynin, tolterodine Certain medications for Parkinson's disease like benztropine, trihexyphenidyl Certain medications for stomach problems like dicyclomine, hyoscyamine Certain medications for travel sickness like scopolamine Dexamethasone Dofetilide Ipratropium NSAIDs, medications for pain and inflammation, like ibuprofen or naproxen Other medications for Alzheimer's disease Other medications that prolong the QT interval (cause an abnormal heart rhythm) Phenobarbital Phenytoin Rifampin, rifabutin or rifapentine Ziprasidone This list may not describe all possible interactions. Give your health care provider a list of all the medicines, herbs, non-prescription drugs, or dietary supplements you use. Also tell them if you smoke, drink alcohol, or use illegal drugs. Some items may interact with your medicine. What should I watch for while using this medication? Visit your care team for regular checks on your progress. Check with your care team if your symptoms do not get better or if they get worse. You may get drowsy or dizzy. Do not drive, use machinery, or do anything that needs mental alertness until you know how this medication affects you. What side effects may I notice from receiving this medication? Side effects that you should report to your care team as soon as possible: Allergic reactions--skin rash, itching, hives, swelling of the face, lips, tongue, or throat Peptic ulcer--burning stomach pain, loss of appetite, bloating, burping, heartburn, nausea, vomiting Seizures Slow heartbeat--dizziness, feeling faint or lightheaded, confusion, trouble breathing, unusual weakness or fatigue Stomach bleeding--bloody or black, tar-like stools, vomiting blood or brown material that looks like coffee grounds Trouble passing urine Side effects that usually do not require medical  attention (report these to your care team if they continue or are bothersome): Diarrhea Fatigue Loss of appetite Muscle pain or cramps Nausea Trouble sleeping This list may not describe all possible side effects. Call your doctor for medical advice about side effects. You may report side effects to FDA at 1-800-FDA-1088. Where should I keep my medication? Keep out of reach of children. Store at room temperature between 15 and 30 degrees C (59 and 86 degrees F). Throw away any unused medication after the expiration date. NOTE: This sheet is a summary. It may not cover all possible information. If you have questions about this medicine, talk to your doctor, pharmacist, or health care provider.  2023 [employer] Standard (2004-06-07 00:00:00)

## 2022-03-06 NOTE — Progress Notes (Signed)
[facility]    Provider:  Dr Jaynee Eagles Requesting Provider: [name], NP Primary Care Provider:  [name], MD  CC: memory changes  HPI:  [name] is a 81 y.o. female here as requested by [name], NP for memory changes. 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 She feels like her short-term memory is impaired. Son provides much information. It is her struggling with short-term memory and association, associating what she reads and understands and how to execute, she does well for someone over 9 with computers, she filled it out online, she used to be a Marine scientist, she gets frustrated, the brain tumor causes a lot of levels of anxiety even though they shrunk it she has remnants, one thing that is concerning is the  changes in her mediine, she manages her own medications at home son doesn't even need to help her, she can do a lot on her own, her personal hygiene is good, she looks good, she gets frustrated with her husband his life is simple but patient is still very social and independent and she has a "traffic jam" in the brain. Anxiety. No FHx of dementia. No other focal neurologic cognitive deficits, associated symptoms, inciting events or modifiable factors.   Reviewed notes, labs and imaging from outside physicians, which showed:  09/2021: TSh normal  02/2022; CBC unremarkable except platelets 144 CMP low potassium 2.8, co2 34 and elevated glucose but nml kidney BUN 14 and creat 0.72, decreased total protein 6.1.  I reviewed emergency room notes from September: Patient was recently in the emergency room in September 2023 after a fall on blood thinners, she was walking to the bathroom and fell and hit her head in a jewelry box, not sure why she fell but she has an extensive history as above in a very complicated patient.  No loss of consciousness.  She is on Eliquis.  Prior to this she was seen the day before same month for an episode of word finding difficulty, her labs were consistent with a UTI and she was started on Keflex.  MRI was negative for acute stroke.  CT after the fall was negative for intracranial hemorrhage.  Labs are overall reassuring and she was started on Keflex for UTI.  MRi brain 02/01/2022: EXAM: MRI HEAD WITHOUT CONTRAST   TECHNIQUE: Multiplanar, multiecho pulse sequences of the brain and surrounding structures were obtained without intravenous contrast.   COMPARISON:  02/18/2014   FINDINGS: Brain: No acute infarct, mass effect or extra-axial collection. Multifocal peripheral predominant bilateral chronic microhemorrhage. There is confluent hyperintense T2-weighted signal within the white matter. Parenchymal volume and CSF  spaces are normal. The midline structures are normal.    Vascular: Major flow voids are preserved.   Skull and upper cervical spine: Normal calvarium and skull base. Visualized upper cervical spine and soft tissues are normal.   Sinuses/Orbits:No paranasal sinus fluid levels or advanced mucosal thickening. No mastoid or middle ear effusion. Normal orbits.   IMPRESSION: 1. No acute intracranial abnormality. 2. Advanced chronic small vessel ischemic disease. 3. Multifocal peripheral predominant bilateral chronic microhemorrhage may indicate cerebral amyloid angiopathy.   CT head 02/08/2022: EXAM: CT HEAD WITHOUT CONTRAST   TECHNIQUE: Contiguous axial images were obtained from the base of the skull through the vertex without intravenous contrast.   RADIATION DOSE REDUCTION: This exam was performed according to the departmental dose-optimization program which includes automated exposure control, adjustment of the mA and/or kV according to patient size and/or use of iterative reconstruction technique.   COMPARISON:  MRI head 02/18/2014, CT head 02/01/2022, CT head 02/02/2022 3:16 a.m.   BRAIN: BRAIN Patchy and confluent areas of decreased attenuation are noted throughout the deep and periventricular white matter of the cerebral hemispheres bilaterally, compatible with chronic microvascular ischemic disease.   No evidence of large-territorial acute infarction. No parenchymal hemorrhage. No mass lesion. No extra-axial collection.   No mass effect or midline shift. No hydrocephalus. Basilar cisterns are patent.   Vascular: No hyperdense vessel.   Skull: No acute fracture or focal lesion.   Sinuses/Orbits: Paranasal sinuses and mastoid air cells are clear. Right lens replacement. Otherwise the orbits are unremarkable.   Other: None.   IMPRESSION: No acute intracranial abnormality.  Review of Systems: Patient complains of symptoms per HPI as well as the following symptoms memory changes. Pertinent negatives and positives per  HPI. All others negative.   Social History   Socioeconomic History   Marital status: Married    Spouse name: Not on file   Number of children: 6   Years of education: college   Highest education level: Not on file  Occupational History   Occupation: retired Marine scientist  Tobacco Use   Smoking status: Never   Smokeless tobacco: Never  Vaping Use   Vaping Use: Never used  Substance and Sexual Activity   Alcohol use: No   Drug use: No   Sexual activity: Not Currently  Other Topics Concern   Not on file  Social History Narrative   Patient consumes 2-3 cups coffee per day.   Social Determinants of Health   Financial Resource Strain: Low Risk  (10/23/2021)   Overall Financial Resource Strain (CARDIA)    Difficulty of Paying Living Expenses: Not hard at all  Food Insecurity: No Food Insecurity (10/23/2021)   Hunger Vital Sign    Worried About Running Out of Food in the Last Year: Never true    Ran Out of Food in the Last Year: Never true  Transportation Needs: No Transportation Needs (10/23/2021)   PRAPARE - Hydrologist (Medical): No    Lack of Transportation (Non-Medical): No  Physical Activity: Insufficiently Active (10/23/2021)   Exercise Vital Sign    Days of Exercise per Week: 2 days    Minutes of Exercise per Session: 40 min  Stress: No Stress Concern Present (10/18/2020)   [place]    Feeling of Stress : Not at all  Social Connections: [place] (10/23/2021)   Social Connection and Isolation Panel [NHANES]    Frequency of Communication with Friends and Family: More  than three times a week    Frequency of Social Gatherings with Friends and Family: More than three times a week    Attends Religious Services: More than 4 times per year    Active Member of [employer] or Organizations: Yes    Attends Music therapist: More than 4 times per year    Marital Status: Married   Human resources officer Violence: Not At Risk (10/23/2021)   Humiliation, Afraid, Rape, and Kick questionnaire    Fear of Current or Ex-Partner: No    Emotionally Abused: No    Physically Abused: No    Sexually Abused: No    Family History  Problem Relation Age of Onset   Diabetes Mother    Heart attack Mother    Hypertension Father    Lung cancer Father    Diabetes Sister    Breast cancer Sister    Breast cancer Sister    Diabetes Brother    Hypertension Brother    Heart disease Brother    Heart attack Brother    Kidney cancer Brother    Gout Brother    Kidney failure Brother        x 5   Esophageal cancer Brother    Stomach cancer Brother    Prostate cancer Brother    Rheum arthritis Maternal Uncle    Uterine cancer Daughter    Hypertension Daughter    Hypertension Daughter    Hypertension Son    Heart murmur Son    Alzheimer's disease Neg Hx    Dementia Neg Hx     Past Medical History:  Diagnosis Date   Acute on chronic diastolic (congestive) heart failure (HCC)    Anemia    iron deficiency anemia - secondary to blood loss ( chronic)    Anxiety    Arthritis    endstage changes bilateral knees/bilateral ankles.    Asthma    Carotid artery occlusion    Chronic fatigue    Chronic kidney disease    Closed left hip fracture (HCC)    Clotting disorder ([place])    pt denies this   Contusion of left knee    due to fall 1/14.   COPD (chronic obstructive pulmonary disease) (HCC)    pulmonary fibrosis   Depression, reactive    Diabetes mellitus    type II    Diastolic dysfunction    Difficulty in walking    Family history of heart disease    Generalized muscle weakness    Gout    High cholesterol    History of falling    Hypertension    Hypothyroidism    Interstitial lung disease ([place])    Meningioma of left sphenoid wing involving cavernous sinus ([place]) 02/17/2012   Continue diplopia, left eye pain and left headaches.     Morbid obesity ([place])    Neuromuscular  disorder ([place])    diabetic neuropathy    Normal coronary arteries    cardiac catheterization performed  10/31/14   Pneumonia    RA (rheumatoid arthritis) ([place])    has been off methotreaxte since 10/13.   Spinal stenosis of lumbar region    Thyroid disease    Unspecified lack of coordination    URI (upper respiratory infection)     Patient Active Problem List   Diagnosis Date Noted   Pulmonary embolism ([place]) 11/15/2021   Tachycardia 10/22/2021   Pulmonary HTN ([place])    Unspecified protein-calorie malnutrition ([place]) 04/05/2021   Pressure injury of  skin 03/31/2021   UTI (urinary tract infection) 03/31/2021   Sepsis due to urinary tract infection ([place]) 03/30/2021   Acute encephalopathy 03/30/2021   Thrombocytopenia (HCC) 03/30/2021   Diabetic neuropathy ([place]) 05/24/2019   Hypokalemia 04/21/2019   Anticoagulated 06/25/2018   Chronic pain 06/25/2018   CRI (chronic renal insufficiency), stage 3 (moderate) (HCC) 06/25/2018   Drug-induced constipation 06/18/2018   DVT (deep venous thrombosis) ([place]) 06/03/2018   Primary osteoarthritis of both knees 05/26/2018   Body mass index (BMI) of 50-59.9 in adult ([place]) 05/26/2018   Acquired hypothyroidism 05/26/2018   Chronic bilateral low back pain without sciatica 02/17/2018   Astigmatism with presbyopia, bilateral 03/26/2017   Cortical age-related cataract of both eyes 03/26/2017   Family history of glaucoma 03/26/2017   Long term current use of oral hypoglycemic drug 03/26/2017   Nuclear sclerotic cataract of both eyes 03/26/2017   Pain in left hip 02/18/2017   Closed nondisplaced intertrochanteric fracture of left femur with delayed healing 01/14/2017   Gout 11/29/2016   Depression 11/29/2016   Closed left hip fracture ([place]) 11/29/2016   GERD (gastroesophageal reflux disease) 11/29/2016   Closed intertrochanteric fracture of hip, left, initial encounter ([place]) 11/29/2016   Physical deconditioning 07/31/2015   Pulmonary fibrosis,  postinflammatory ([place]) 07/31/2015   Bronchiectasis without complication ([place]) 03/47/4259   Tendinitis of left rotator cuff 06/13/2015   Chronic diastolic CHF (congestive heart failure) ([place]) 04/21/2015   Acute kidney injury superimposed on chronic kidney disease ([place]) 04/17/2015   Chronic respiratory failure with hypoxia ([place]) 02/09/2015   Dyspnea and respiratory abnormality 01/03/2015   Normal coronary arteries 11/02/2014   Morbid obesity ([place]) 10/30/2014   Dysphagia 08/21/2014   Chronic fatigue 06/21/2014   DOE (dyspnea on exertion) 05/24/2014   Primary osteoarthritis of left knee 04/26/2014   High risk medication use 04/17/2014   Aphasia 02/12/2014   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 ([place]) 01/18/2014   Lumbar facet arthropathy 01/18/2014   Bilateral edema of lower extremity 05/30/2013   Chronic cough 05/24/2013   ILD (interstitial lung disease) ([place]) 04/10/2013   Spinal stenosis of lumbar region with radiculopathy 07/12/2012   Inability to walk 07/12/2012   Meningioma of left sphenoid wing involving cavernous sinus ([place]) 02/17/2012   Chest pain of uncertain etiology 56/38/7564   Rheumatoid arthritis ([place]) 01/28/2012   Primary osteoarthritis of right knee 01/28/2012   Dyslipidemia    Thyroid disease    Essential hypertension     Past Surgical History:  Procedure Laterality Date   ABDOMINAL HYSTERECTOMY     BRAIN SURGERY     Gamma knife 10/13. Needs repeat spring  '14   CARDIAC CATHETERIZATION N/A 10/31/2014   Procedure: Right/Left Heart Cath and Coronary Angiography;  Surgeon: [name], MD;  Location: [facility];  Service: Cardiovascular;  Laterality: N/A;   CYST EXCISION     2 on face   CYST REMOVAL NECK     ESOPHAGOGASTRODUODENOSCOPY (EGD) WITH PROPOFOL N/A 09/14/2014   Procedure: ESOPHAGOGASTRODUODENOSCOPY (EGD) WITH PROPOFOL;  Surgeon: [name], MD;  Location: [facility];  Service: Endoscopy;  Laterality: N/A;   INCISION AND  DRAINAGE HIP Left 01/16/2017   Procedure: IRRIGATION AND DEBRIDEMENT LEFT HIP;  Surgeon: [name], MD;  Location: [facility];  Service: Orthopedics;  Laterality: Left;   INTRAMEDULLARY (IM) NAIL INTERTROCHANTERIC Left 11/29/2016   Procedure: INTRAMEDULLARY (IM) NAIL INTERTROCHANTRIC;  Surgeon: [name], MD;  Location: [facility];  Service: Orthopedics;  Laterality: Left;  OVARY SURGERY     RIGHT HEART CATH N/A 08/12/2021   Procedure: RIGHT HEART CATH;  Surgeon: Lorretta Harp, MD;  Location: [facility];  Service: Cardiovascular;  Laterality: N/A;   SHOULDER SURGERY Left    TONSILLECTOMY  age 27   VIDEO BRONCHOSCOPY Bilateral 05/31/2013   Procedure: VIDEO BRONCHOSCOPY WITHOUT FLUORO;  Surgeon: Brand Males, MD;  Location: [facility];  Service: Cardiopulmonary;  Laterality: Bilateral;   video bronscoscopy  05/19/1998   lung    Current Outpatient Medications  Medication Sig Dispense Refill   acetaminophen (TYLENOL) 500 MG tablet Take 500-1,000 mg by mouth every 6 (six) hours as needed for moderate pain.     albuterol (PROVENTIL) (2.5 MG/3ML) 0.083% nebulizer solution Take 3 mLs (2.5 mg total) by nebulization every 6 (six) hours as needed for wheezing. 75 mL 0   allopurinol (ZYLOPRIM) 300 MG tablet Take 1 tablet (300 mg total) by mouth daily. 30 tablet 0   amLODipine (NORVASC) 10 MG tablet Take 10 mg by mouth daily.     apixaban (ELIQUIS) 5 MG TABS tablet Take 1 tablet (5 mg total) by mouth 2 (two) times daily. 60 tablet 5   Artificial Tear Ointment (DRY EYES OP) Place 1 drop into both eyes 2 (two) times daily as needed (dry eyes).     aspirin 81 MG chewable tablet Chew 1 tablet by mouth daily.     atorvastatin (LIPITOR) 20 MG tablet Take 0.5 tablets (10 mg total) by mouth every evening. (Patient taking differently: Take 20 mg by mouth every evening.) 15 tablet 0   azithromycin (ZITHROMAX) 250 MG tablet Take 250 mg by mouth 3 (three) times a week.      Calcium Carb-Cholecalciferol (CALCIUM + D3 PO) Take 2 tablets by mouth daily with lunch.      Cholecalciferol (VITAMIN D-3) 25 MCG (1000 UT) CAPS Take 2 capsules (2,000 Units total) by mouth daily. GIVE ONE TAB PO Q DAY 60 capsule 0   colchicine 0.6 MG tablet Take 1 tablet (0.6 mg total) by mouth daily as needed (gout flares). 30 tablet 0   cyclobenzaprine (FLEXERIL) 5 MG tablet Take 1 tablet (5 mg total) by mouth at bedtime as needed for muscle spasms. 90 tablet 1   diclofenac Sodium (VOLTAREN) 1 % GEL Apply 2 g topically 3 (three) times daily. Apply to both hands and knees 100 g 1   donepezil (ARICEPT) 5 MG tablet Take 1 tablet (5 mg total) by mouth at bedtime. 90 tablet 3   Dulaglutide (TRULICITY) 3.14 HF/0.2OV SOPN Inject 0.75 mg into the skin once a week. 6 mL 2   DULoxetine (CYMBALTA) 60 MG capsule Take 1 capsule (60 mg total) by mouth daily. 90 capsule 1   fluticasone (FLONASE) 50 MCG/ACT nasal spray Place 2 sprays into both nostrils daily. 16 g 3   furosemide (LASIX) 80 MG tablet Take 1 tablet (80 mg total) by mouth daily. 30 tablet 11   gabapentin (NEURONTIN) 600 MG tablet Take 2 tablets (1,200 mg total) by mouth 3 (three) times daily. 180 tablet 1   guaiFENesin (MUCINEX) 600 MG 12 hr tablet Take 2 tablets (1,200 mg total) by mouth 2 (two) times daily. 120 tablet 0   hydrALAZINE (APRESOLINE) 25 MG tablet Take 1 tablet (25 mg total) by mouth 2 (two) times daily. 180 tablet 1   hydroxychloroquine (PLAQUENIL) 200 MG tablet Take 1 tablet (200 mg total) by mouth daily. 30 tablet 0   leflunomide (ARAVA) 20 MG tablet  Take 1 tablet (20 mg total) by mouth daily. 30 tablet 0   levothyroxine (SYNTHROID) 50 MCG tablet Take 1 tablet (50 mcg total) by mouth daily. 90 tablet 3   linaclotide (LINZESS) 72 MCG capsule Take 1 capsule (72 mcg total) by mouth daily before breakfast. 30 capsule 3   metolazone (ZAROXOLYN) 2.5 MG tablet TAKE 1 TABLET BY MOUTH DAILY AS NEEDED FOR 2 DAYS FOR WEIGHT ABOVE 255 POUNDS  30 tablet 0   metoprolol succinate (TOPROL-XL) 100 MG 24 hr tablet Take 1 tablet by mouth once daily along with 1 metoprolol 37m tablet 90 tablet 1   metoprolol succinate (TOPROL-XL) 25 MG 24 hr tablet Take 5 tablets (125 mg total) by mouth daily. 150 tablet 3   morphine (MS CONTIN) 15 MG 12 hr tablet Take 1 tablet (15 mg total) by mouth every 12 (twelve) hours. Do Not Fill Before 03/02/2022 60 tablet 0   morphine (MSIR) 15 MG tablet Take 1 tablet (15 mg total) by mouth 2 (two) times daily. 60 tablet 0   Multiple Vitamin (MULTIVITAMIN WITH MINERALS) TABS Take 1 tablet by mouth daily.      Nintedanib (OFEV) 100 MG CAPS Take 1 capsule (100 mg total) by mouth 2 (two) times daily. 180 capsule 1   olmesartan (BENICAR) 40 MG tablet Take 1 tablet (40 mg total) by mouth daily. 90 tablet 3   ondansetron (ZOFRAN-ODT) 4 MG disintegrating tablet Take 1 tablet (4 mg total) by mouth every 8 (eight) hours as needed for nausea or vomiting. 14 tablet 1   OXYGEN-HELIUM IN Inhale 3 L into the lungs at bedtime. And on exertion     pantoprazole (PROTONIX) 40 MG tablet take 1 tablet by mouth once daily MINUTES BEFORE 1ST MEAL OF THE DAY 90 tablet 3   Potassium Chloride ER 20 MEQ TBCR Take 20 mEq by mouth daily. 30 tablet 0   predniSONE (DELTASONE) 10 MG tablet Take 1 tablet (10 mg total) by mouth daily with breakfast. 90 tablet 1   sodium chloride (OCEAN) 0.65 % SOLN nasal spray Place 1 spray into both nostrils as needed for congestion.     traMADol (ULTRAM) 50 MG tablet Take by mouth.     No current facility-administered medications for this visit.    Allergies as of 03/06/2022 - Review Complete 03/06/2022  Allergen Reaction Noted   Codeine Swelling and Other (See Comments) 09/11/2011   Infliximab Anaphylaxis 09/11/2011   Lisinopril Swelling 09/11/2011    Vitals: BP 137/79   Pulse 98   Ht _0  (1.626 m)   Wt 224 lb (101.6 kg)   BMI 38.45 kg/m  Last Weight:  Wt Readings from Last 1 Encounters:   03/06/22 224 lb (101.6 kg)   Last Height:   Ht Readings from Last 1 Encounters:  03/06/22 _1  (1.626 m)     Physical exam: Exam: Gen: NAD, conversant, well nourised, obese, well groomed                     CV: RRR, no MRG. No Carotid Bruits. No peripheral edema, warm, nontender Eyes: Conjunctivae clear without exudates or hemorrhage  Neuro: Detailed Neurologic Exam  Speech:    Speech is normal; fluent and spontaneous with normal comprehension.  Cognition:     03/06/2022    7:56 AM  MMSE - Mini Mental State Exam  Orientation to time 5  Orientation to Place 3  Registration 3  Attention/ Calculation 1  Recall 3  Language-  name 2 objects 2  Language- repeat 0  Language- follow 3 step command 3  Language- read & follow direction 1  Write a sentence 1  Copy design 0  Total score 22   Cranial Nerves:    The pupils are equal, round, and reactive to light. Attempted pupils too small to visualize fundi. Visual fields are full to finger confrontation. Extraocular movements are intact. Trigeminal sensation is intact and the muscles of mastication are normal. The face is symmetric. The palate elevates in the midline. Hearing intact. Voice is normal. Shoulder shrug is normal. The tongue has normal motion without fasciculations.   Coordination:    Normal  Gait:   Attempted cannot stand independently  Motor Observation:    No asymmetry, no atrophy, and no involuntary movements noted. Tone:    Normal muscle tone.    Posture:    Posture is normal in wheel chair    Strength:    Strength is V/V in the upper and lower limbs.      Sensation: intact to LT     Reflex Exam:  DTR's:    Deep tendon reflexes in the upper and lower extremities are symmetrical bilaterally.   Toes:    The toes are downgoing bilaterally.   Clonus:    Clonus is absent.    Assessment/Plan:  81 y.o. female here as requested by [name], NP for memory changes. 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 MMSE 22/30.  Given her microhemorrhages, its recommended not to be on aspirin or Eliquis however given patient's prior history risk of clotting/DVT versus cerebral hemorrhage is debatable.  I did talk to patient and her son about this today.  Since she has these hemorrhages in her brain, being on Eliquis is of a risk factor for bleeding in the brain, however not being on Eliquis is a risk factor for having clots, DVTs that she has had in the past.  At this point its risk versus benefit and I will leave it up to the family.  Luckily Eliquis is much safer than our prior medications. She is not a good candidate fo Lecambi given the bleeding risk already for lecambi. At this point I recommend aricept and namenda, managing  vascular risk factors, and following clinically. They also agree to formal neurocognitive testing.   Start Donepezil - memory medication Fromal memory testing with Dr. Alphonzo [place] work F/u 6 months Speech therapy for cognitive therapy - It is her struggling with short-term memory and association, associating what she reads and understands and how to execute, she used to be a Marine scientist, she gets frustrated,  but patient is still very social and independent but reports she has a "traffic jam" in the brain. Maybe some speech cognitive therapy can help.   Orders Placed This Encounter  Procedures   B12 and Folate Panel   Methylmalonic acid, serum   Homocysteine   Vitamin B1   RPR   Ambulatory referral to Speech Therapy   Ambulatory referral to Neuropsychology   Meds ordered this encounter  Medications   donepezil (ARICEPT) 5 MG tablet    Sig: Take 1 tablet (5 mg total) by mouth at bedtime.    Dispense:  90 tablet    Refill:  3    Cc: [name], NP,  [name], MD  Sarina Ill, MD  [facility] [address] [place], [place] [address]  Phone [phone] Fax [phone]  I spent over 60 minutes of face-to-face and non-face-to-face time with patient on the  1. Cognitive decline   2. Cerebrovascular disease   3. Generalized atherosclerosis   4. screen for B12 deficiency   5. screen for Vitamin B1 deficiency    diagnosis.  This included previsit chart review, lab review, study review, order entry, electronic health record documentation, patient education on the different diagnostic and therapeutic options, counseling and coordination of care, risks and benefits of management, compliance, or risk factor reduction

## 2022-03-09 ENCOUNTER — Encounter: Payer: Self-pay | Admitting: Neurology

## 2022-03-10 ENCOUNTER — Telehealth: Payer: Self-pay | Admitting: Neurology

## 2022-03-10 NOTE — Telephone Encounter (Signed)
Referral sent to Dr. Alphonzo Severance in [facility], phone # [phone].

## 2022-03-11 ENCOUNTER — Ambulatory Visit (INDEPENDENT_AMBULATORY_CARE_PROVIDER_SITE_OTHER): Payer: Medicare Other | Admitting: Nurse Practitioner

## 2022-03-11 ENCOUNTER — Encounter: Payer: Self-pay | Admitting: Nurse Practitioner

## 2022-03-11 VITALS — BP 142/82 | HR 89 | Ht 64.0 in | Wt 224.0 lb

## 2022-03-11 DIAGNOSIS — R1032 Left lower quadrant pain: Secondary | ICD-10-CM | POA: Diagnosis not present

## 2022-03-11 DIAGNOSIS — R933 Abnormal findings on diagnostic imaging of other parts of digestive tract: Secondary | ICD-10-CM

## 2022-03-11 LAB — B12 AND FOLATE PANEL
Folate: 11.2 ng/mL (ref >3.0)
Vitamin B-12: 824 pg/mL (ref 232–1245)

## 2022-03-11 LAB — VITAMIN B1: Thiamine: 124.5 nmol/L (ref 66.5–200.0)

## 2022-03-11 LAB — METHYLMALONIC ACID, SERUM: Methylmalonic Acid: 142 nmol/L (ref 0–378)

## 2022-03-11 LAB — RPR: RPR Ser Ql: NONREACTIVE

## 2022-03-11 LAB — HOMOCYSTEINE: Homocysteine: 9.3 umol/L (ref 0.0–19.2)

## 2022-03-11 NOTE — Progress Notes (Signed)
Chief Complaint:  lower abdominal pain, abnormal CT scan.    Assessment &  Plan   # 81 yo female with intermittent LLQ pain. Recent CT scan suggesting colovesical fistula.  Sigmoid colon is tethered to the left ovary and the dome of the urinary bladder There is bladder wall thickening focally in the region where it is tethered to the sigmoid colon.  Of note UA unremarkable (no bacteria was seen).  Unclear if the CT scan findings explain her LLQ pain. Dr. Silverio Decamp to review the CT scan.  Not sure fistula would be seen on colonoscopy and she is certainly at increased risk for procedures. #Chronic respiratory failure on home O2 at night and during the day as needed.   # PE, on Eliquis  # Chronic thrombocytopenia.  Platelets stable at 144.  No splenomegaly on recent CT scan  # Polypharmacy  #See below for additional medical history  HPI   Patient is an 81 year old female known to Dr. Silverio Decamp.  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  See PMH for additional medical history  Patient is here for an ED follow up .  She was seen in the ED on 03/01/2022 for evaluation of LLQ pain. LLQ shooting pain started around 11pm, woke her from sleep.  Son rubbed her back and she began belching and passing flatus without relief.  She had associated nausea / vomiting.   She has never experienced this type of pain. She wasn't constipated. Takes Linzess every day.  Her labs were remarkable for lipase of 75, potassium 2.8, glucose 221, platelets 144.  CT scan without acute findings. Sigmoid colon is tethered to the left ovary and the dome of the urinary bladder There is bladder wall thickening focally in the region where it is tethered to the sigmoid  colon.  Imaging concerning for colovesical fistula.  Urinalysis was normal.  She was discharged from the ED with Zofran  Interval History:  Still continues to have the same shooting pain in LLQ. Pain is intermittent.  No aggravating or alleviating factors. She has tried a heating pad without relief. The pain is not related to movement.  Pain doesn't improve with bowel movements. No urinary symptoms    Wear 02 at night and sometimes during the day.   Labs:     Latest Ref Rng & Units 03/01/2022    2:43 PM 02/02/2022    6:16 PM 02/01/2022    3:05 PM  CBC  WBC 4.0 - 10.5 K/uL 6.8  7.5  8.0   Hemoglobin 12.0 - 15.0 g/dL 12.0  13.5  13.3   Hematocrit 36.0 - 46.0 % 39.1  43.0  43.7   Platelets 150 - 400 K/uL 144  149  148        Latest Ref Rng & Units 03/01/2022    2:43 PM 02/01/2022    3:05 PM 11/05/2021   12:44 PM  Hepatic Function  Total Protein 6.5 - 8.1 g/dL 6.1  5.8  5.6   Albumin 3.5 - 5.0 g/dL 3.6  3.7  3.4   AST 15 - 41 U/L _0 ALT 0 - 44 U/L _1 Alk Phosphatase 38 - 126 U/L 81  86  66   Total Bilirubin 0.3 - 1.2 mg/dL 0.5  0.8  0.4   Bilirubin, Direct 0.0 - 0.3 mg/dL   0.1      Past Medical History:  Diagnosis Date   Acute on chronic diastolic (congestive) heart failure (HCC)    Anemia    iron deficiency anemia - secondary to blood loss ( chronic)    Anxiety    Arthritis    endstage changes bilateral knees/bilateral ankles.    Asthma    Carotid artery occlusion    Chronic fatigue    Chronic kidney disease    Closed left hip fracture (HCC)    Clotting disorder ([place])    pt denies this   Contusion of left knee    due to fall 1/14.   COPD (chronic obstructive pulmonary disease) (HCC)    pulmonary fibrosis   Depression, reactive    Diabetes mellitus    type II    Diastolic dysfunction    Difficulty in walking    Family history of heart disease    Generalized muscle weakness    Gout    High cholesterol    History of falling    Hypertension     Hypothyroidism    Interstitial lung disease ([place])    Meningioma of left sphenoid wing involving cavernous sinus ([place]) 02/17/2012   Continue diplopia, left eye pain and left headaches.     Morbid obesity ([place])    Neuromuscular disorder ([place])    diabetic neuropathy    Normal coronary arteries    cardiac catheterization performed  10/31/14   Pneumonia    RA (rheumatoid arthritis) ([place])    has been off methotreaxte since 10/13.   Spinal stenosis of lumbar region    Thyroid disease    Unspecified lack of coordination    URI (upper respiratory infection)     Past Surgical History:  Procedure Laterality Date   ABDOMINAL HYSTERECTOMY     BRAIN SURGERY     Gamma knife 10/13. Needs repeat spring  '14   CARDIAC CATHETERIZATION N/A 10/31/2014   Procedure: Right/Left Heart Cath and Coronary Angiography;  Surgeon: [name], MD;  Location: [facility];  Service: Cardiovascular;  Laterality: N/A;   CYST EXCISION     2 on face   CYST REMOVAL NECK     ESOPHAGOGASTRODUODENOSCOPY (EGD) WITH PROPOFOL N/A 09/14/2014   Procedure: ESOPHAGOGASTRODUODENOSCOPY (EGD) WITH PROPOFOL;  Surgeon: [name], MD;  Location: [facility];  Service: Endoscopy;  Laterality: N/A;   INCISION AND DRAINAGE HIP Left 01/16/2017   Procedure: IRRIGATION AND DEBRIDEMENT LEFT HIP;  Surgeon: [name], MD;  Location: [facility];  Service: Orthopedics;  Laterality: Left;   INTRAMEDULLARY (IM) NAIL INTERTROCHANTERIC Left 11/29/2016   Procedure: INTRAMEDULLARY (IM) NAIL INTERTROCHANTRIC;  Surgeon: [name], MD;  Location: [facility];  Service: Orthopedics;  Laterality: Left;   OVARY SURGERY     RIGHT HEART CATH N/A 08/12/2021   Procedure: RIGHT HEART CATH;  Surgeon: Lorretta Harp, MD;  Location: [facility];  Service: Cardiovascular;  Laterality: N/A;   SHOULDER SURGERY Left    TONSILLECTOMY  age 2   VIDEO BRONCHOSCOPY Bilateral 05/31/2013   Procedure: VIDEO BRONCHOSCOPY  WITHOUT FLUORO;  Surgeon: Brand Males, MD;  Location: [facility];  Service: Cardiopulmonary;  Laterality: Bilateral;   video bronscoscopy  05/19/1998   lung    Current Medications, Allergies, Family History and Social History were reviewed in [employer] record.  Current Outpatient Medications  Medication Sig Dispense Refill   acetaminophen (TYLENOL) 500 MG tablet Take 500-1,000 mg by mouth every 6 (six) hours as needed for moderate pain.     albuterol (PROVENTIL) (2.5 MG/3ML) 0.083% nebulizer solution Take 3 mLs (2.5 mg total) by nebulization every 6 (six) hours as needed for wheezing. 75 mL 0   allopurinol (ZYLOPRIM) 300 MG tablet Take 1 tablet (300 mg total) by mouth daily. 30 tablet 0   amLODipine (NORVASC) 10 MG tablet Take 10 mg by mouth daily.     apixaban (ELIQUIS) 5 MG TABS tablet Take 1 tablet (5 mg total) by mouth 2 (two) times daily. 60 tablet 5   Artificial Tear Ointment (DRY EYES OP) Place 1 drop into both eyes 2 (two) times daily as needed (dry eyes).     aspirin 81 MG chewable tablet Chew 1 tablet by mouth daily.     atorvastatin (LIPITOR) 20 MG tablet Take 0.5 tablets (10 mg total) by mouth every evening. (Patient taking differently: Take 20 mg by mouth every evening.) 15 tablet 0   azithromycin (ZITHROMAX) 250 MG tablet Take 250 mg by mouth 3 (three) times a week.     Calcium Carb-Cholecalciferol (CALCIUM + D3 PO) Take 2 tablets by mouth daily with lunch.      Cholecalciferol (VITAMIN D-3) 25 MCG (1000 UT) CAPS Take 2 capsules (2,000 Units total) by mouth daily. GIVE ONE TAB PO Q DAY 60 capsule 0   colchicine 0.6 MG tablet Take 1 tablet (0.6 mg total) by mouth daily as needed (gout flares). 30 tablet 0   cyclobenzaprine (FLEXERIL) 5 MG tablet Take 1 tablet (5 mg total) by mouth at bedtime as needed for muscle spasms. 90 tablet 1   diclofenac Sodium (VOLTAREN) 1 % GEL Apply 2 g topically 3 (three) times daily. Apply to both hands and knees 100  g 1   donepezil (ARICEPT) 5 MG tablet Take 1 tablet (5 mg total) by mouth at bedtime. 90 tablet 3   Dulaglutide (TRULICITY) 7.35 HG/9.9ME SOPN Inject 0.75 mg into the skin once a week. 6 mL 2   DULoxetine (CYMBALTA) 60 MG capsule Take 1 capsule (60 mg total) by mouth daily. 90 capsule 1   fluticasone (FLONASE) 50 MCG/ACT nasal spray Place 2 sprays into both nostrils daily. 16 g 3   furosemide (LASIX) 80 MG tablet Take 1 tablet (80 mg total) by mouth daily. 30 tablet 11   gabapentin (NEURONTIN) 600 MG tablet Take 2 tablets (1,200 mg total) by mouth 3 (three) times daily. 180 tablet 1   guaiFENesin (MUCINEX) 600 MG 12 hr tablet Take 2 tablets (1,200 mg total) by mouth 2 (two) times daily. 120 tablet 0   hydrALAZINE (APRESOLINE) 25 MG tablet Take 1 tablet (25 mg total) by mouth 2 (two) times daily. 180 tablet 1   hydroxychloroquine (PLAQUENIL) 200 MG tablet Take 1 tablet (200 mg total) by mouth daily. 30 tablet 0   leflunomide (ARAVA) 20 MG tablet Take 1 tablet (20 mg total) by mouth daily. 30 tablet 0   levothyroxine (SYNTHROID) 50 MCG tablet Take 1 tablet (50 mcg total) by mouth daily. 90 tablet 3   linaclotide (LINZESS) 72 MCG capsule Take 1 capsule (72 mcg total) by mouth daily before breakfast. 30 capsule 3   metolazone (ZAROXOLYN) 2.5 MG tablet TAKE 1 TABLET BY MOUTH DAILY AS NEEDED FOR 2 DAYS FOR WEIGHT ABOVE 255 POUNDS 30 tablet 0   metoprolol succinate (TOPROL-XL) 100 MG 24 hr  tablet Take 1 tablet by mouth once daily along with 1 metoprolol 40m tablet 90 tablet 1   metoprolol succinate (TOPROL-XL) 25 MG 24 hr tablet Take 5 tablets (125 mg total) by mouth daily. 150 tablet 3   morphine (MS CONTIN) 15 MG 12 hr tablet Take 1 tablet (15 mg total) by mouth every 12 (twelve) hours. Do Not Fill Before 03/02/2022 60 tablet 0   morphine (MSIR) 15 MG tablet Take 1 tablet (15 mg total) by mouth 2 (two) times daily. 60 tablet 0   Multiple Vitamin (MULTIVITAMIN WITH MINERALS) TABS Take 1 tablet by mouth  daily.      Nintedanib (OFEV) 100 MG CAPS Take 1 capsule (100 mg total) by mouth 2 (two) times daily. 180 capsule 1   olmesartan (BENICAR) 40 MG tablet Take 1 tablet (40 mg total) by mouth daily. 90 tablet 3   ondansetron (ZOFRAN-ODT) 4 MG disintegrating tablet Take 1 tablet (4 mg total) by mouth every 8 (eight) hours as needed for nausea or vomiting. 14 tablet 1   OXYGEN-HELIUM IN Inhale 3 L into the lungs at bedtime. And on exertion     pantoprazole (PROTONIX) 40 MG tablet take 1 tablet by mouth once daily MINUTES BEFORE 1ST MEAL OF THE DAY 90 tablet 3   Potassium Chloride ER 20 MEQ TBCR Take 20 mEq by mouth daily. 30 tablet 0   predniSONE (DELTASONE) 10 MG tablet Take 1 tablet (10 mg total) by mouth daily with breakfast. 90 tablet 1   sodium chloride (OCEAN) 0.65 % SOLN nasal spray Place 1 spray into both nostrils as needed for congestion.     traMADol (ULTRAM) 50 MG tablet Take by mouth.     No current facility-administered medications for this visit.    Review of Systems: No chest pain. No shortness of breath. No urinary complaints.    Physical Exam  Wt Readings from Last 3 Encounters:  03/06/22 224 lb (101.6 kg)  03/01/22 218 lb 14.7 oz (99.3 kg)  02/04/22 219 lb (99.3 kg)    BP (!) 142/82   Pulse 89   Ht _0  (1.626 m)   Wt 224 lb (101.6 kg) Comment: verbal, wheelchair  SpO2 98%   BMI 38.45 kg/m  Constitutional:  Pleasant, obese female in wheelchair. Son is with her.  Psychiatric:  Normal mood and affect. Behavior is normal. EENT: Pupils normal.  Conjunctivae are normal. No scleral icterus. Neck supple.  Cardiovascular: Normal rate, regular rhythm. No edema Pulmonary/chest: Effort normal. Bibasilar crackles.   Abdominal:  Examined in wheelchair. Abdomen is soft, nondistended, nontender. Bowel sounds active throughout. There are no masses palpable. No hepatomegaly. Neurological: Alert and oriented to person place and time. Skin: Skin is warm and dry. No rashes  noted.  I spent 30 minutes total reviewing records, obtaining history, performing exam, counseling patient and documenting visit / findings.   PTye Savoy NP  03/11/2022, 8:49 AM

## 2022-03-11 NOTE — Patient Instructions (Addendum)
_______________________________________________________  If you are age 80 or older, your body mass index should be between 23-30. Your Body mass index is 38.45 kg/m. If this is out of the aforementioned range listed, please consider follow up with your Primary Care Provider.  If you are age 46 or younger, your body mass index should be between 19-25. Your Body mass index is 38.45 kg/m. If this is out of the aformentioned range listed, please consider follow up with your Primary Care Provider.   ________________________________________________________  The [facility] providers would like to encourage you to use [facility] to communicate with providers for non-urgent requests or questions.  Due to long hold times on the telephone, sending your provider a message by [facility] may be a faster and more efficient way to get a response.  Please allow 48 business hours for a response.  Please remember that this is for non-urgent requests.  _______________________________________________________   We will contact you after Dr. Silverio Decamp looks at your CT scan.   It was a pleasure to see you today!  Thank you for trusting me with your gastrointestinal care!

## 2022-03-13 ENCOUNTER — Telehealth: Payer: Self-pay

## 2022-03-13 ENCOUNTER — Other Ambulatory Visit: Payer: Self-pay

## 2022-03-13 DIAGNOSIS — R933 Abnormal findings on diagnostic imaging of other parts of digestive tract: Secondary | ICD-10-CM

## 2022-03-13 DIAGNOSIS — Z86010 Personal history of colonic polyps: Secondary | ICD-10-CM

## 2022-03-13 NOTE — Telephone Encounter (Signed)
Patient on Eliquis for 05/2018 DVT with presumed PE and 10/30/21 CT chest with acute segmental and subsegmental PE in RUL with subsegmental embolus ins RLL. Will route to pharmacy team for input.   Platelet count: 03/01/2022: Platelets 144   Creatinine clearance:  43m/min based on adjusted weight (1081mmin when not adjusted on weight)  03/01/2022: Creatinine, Ser 0.72; Hemoglobin 12.0   CaLoel DubonnetNP  03/13/22  11:31 AM

## 2022-03-13 NOTE — Telephone Encounter (Signed)
Cardiac clearance requested.

## 2022-03-13 NOTE — Telephone Encounter (Signed)
-----  Message from [name], NP sent at 03/12/2022  1:12 PM EDT ----- Regarding: Luis Abed,  Ms. Hendriks is on Eliquis for history of pulmonary embolism.  Hopefully that can be held for 2 days prior to colonoscopy but we will need to get clearance from prescribing provider.  Thanks

## 2022-03-13 NOTE — Telephone Encounter (Signed)
[facility] Pre-operative Risk Assessment     Request for surgical clearance:     Endoscopy Procedure  What type of surgery is being performed?     Colonoscopy   When is this surgery scheduled?     To be determined   What type of clearance is required ?   Pharmacy  Are there any medications that need to be held prior to surgery and how long? Eliquis 2 day hold   Practice name and name of physician performing surgery?     [facility]  What is your office phone and fax number?      Phone- [phone]  [phone]  Anesthesia type (None, local, MAC, general) ?       MAC

## 2022-03-14 NOTE — Telephone Encounter (Signed)
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    Patient on Eliquis for 05/2018 DVT with presumed PE and 10/30/21 CT chest with acute segmental and subsegmental PE in RUL with subsegmental embolus ins RLL.  Procedure: Colonoscopy  Date of procedure: TBD  CrCl 72 ml/min Platelet count 144  Patient found to have PE on 10/30/21. She will need to be on uninterrupted anticoagulation for 6 months, therefore procedure should be scheduled no earlier than 05/05/22.   Due to her hx of multiple DVT/PE would prefer patient hold Elqiuis 1 day, however she may hold 2 days prior to procedure if needed.   **This guidance is not considered finalized until pre-operative APP has relayed final recommendations.**

## 2022-03-14 NOTE — Telephone Encounter (Signed)
   Patient Name: [name]  DOB: April 28, 1941 MRN: [id number]  Primary Cardiologist: Quay Burow, MD  Chart reviewed as part of pre-operative protocol coverage. Pharmacy clearance only. Per office protocols and pharmacist review  [name] was found to have PE on 10/30/21. She will need to be on uninterrupted anticoagulation for 6 months, therefore procedure should be scheduled no earlier than 05/05/22.    Due to her hx of multiple DVT/PE would prefer patient hold Elqiuis 1 day, however she may hold 2 days prior to procedure if needed.   I will route this recommendation to the requesting party via Epic fax function and remove from pre-op pool.  Please call with questions.  Loel Dubonnet, NP 03/14/2022, 10:48 AM

## 2022-03-14 NOTE — Telephone Encounter (Signed)
Cardiology said that procedure should be scheduled no earlier than 05/05/22. Pt scheduled with Dr. Bryan Lemma on 11/8. There's a 12/13 day available as well for Dr. Bryan Lemma but there's not an available day after 12/18 on the December schedule. Please advise.

## 2022-03-17 NOTE — Telephone Encounter (Signed)
If Dr Bryan Lemma is ok with proceeding with colonoscopy without stopping Eliquis, may be we can do it as scheduled or please check if I can do the case during my hospital week in Dec 4th without stopping Eliquis. Thanks

## 2022-03-18 ENCOUNTER — Telehealth: Payer: Self-pay

## 2022-03-18 ENCOUNTER — Other Ambulatory Visit: Payer: Self-pay

## 2022-03-18 DIAGNOSIS — Z860100 Personal history of colon polyps, unspecified: Secondary | ICD-10-CM

## 2022-03-18 DIAGNOSIS — R933 Abnormal findings on diagnostic imaging of other parts of digestive tract: Secondary | ICD-10-CM

## 2022-03-18 DIAGNOSIS — R1032 Left lower quadrant pain: Secondary | ICD-10-CM

## 2022-03-18 MED ORDER — CLENPIQ 10-3.5-12 MG-GM -GM/160ML PO SOLN: 1.0000 | Freq: Once | ORAL | 0 refills | Status: AC

## 2022-03-18 NOTE — Telephone Encounter (Signed)
Pt scheduled for 03/26/22 at 11:15 am with Dr. Bryan Lemma at [facility]. Case id: [id number]. Ambulatory referral placed. Prep sent to pt's pharmacy. Instructions sent to pt's mychart and mailed.

## 2022-03-18 NOTE — Telephone Encounter (Signed)
-----  Message from [name], NP sent at 03/14/2022 10:37 AM EDT ----- That would be great. Abnormal CT scan, rule out fistula. Also got colon report from Dr. Benson Norway. Remote history of colon adenomas, way overdue for surveillance. I addended my office notes with those findings. Thanks so much !!   ----- Message ----- From: Lavena Bullion, DO Sent: 03/13/2022   3:54 PM EDT To: [name], NP; [name], MD; #  I am ok to add to my 11/8 [facility] block to help expedite this for her.   VC  ----- Message ----- From: Marice Potter, RN Sent: 03/13/2022   3:19 PM EDT To: [name], NP; [name], MD; #  Dr. Silverio Decamp currently doesn't have an available hospital day. Dr. Bryan Lemma has room for an EGD on 11/8 but since this is a colonoscopy, could it be added? Or should I keep looking.  ----- Message ----- From: [name], NP Sent: 03/12/2022   1:07 PM EDT To: Marice Potter, RN  Mickel Baas,  Please let patient know that Dr. Silverio Decamp looked at her CT scan.  She is recommending a colonoscopy to further evaluate the colon/CT scan findings.  I did explain to the patient when she was in clinic that she was at increased risk for procedures given her medical problems.  Please schedule the procedure to be done at the hospital if she is agreeable. Diagnosis is abnormal CT scan and also history of colon polyps. If she has problems with constipation then should get a two day prep.   Thanks.     ----- Message ----- From: [name], MD Sent: 03/11/2022   4:05 PM EDT To: [name], NP  It is hard to exclude any mass lesion given her left ovary, bladder and sigmoid colon are inflamed and attached.  She will need colonoscopy for further evaluation, will need to be scheduled at the hospital either with me or any available provider within the next few weeks.  Thank you ----- Message ----- From: [name], NP Sent: 03/11/2022  11:07 AM  EDT To: [name], MD  Hi, when you get a chance we look at this lady's CT.  There is concern for colovesical fistula.  Recent UA did not show any bacteria.  I am seeing her in the office for evaluation..   Thanks

## 2022-03-18 NOTE — Telephone Encounter (Signed)
Pt scheduled for 03/26/22 at 11:15 am with Dr. Bryan Lemma at [facility]. Case id: [id number]. Ambulatory referral placed. Prep sent to pt's pharmacy. Instructions sent to pt's mychart and mailed.

## 2022-03-19 ENCOUNTER — Encounter (HOSPITAL_COMMUNITY): Payer: Self-pay | Admitting: Gastroenterology

## 2022-03-19 ENCOUNTER — Encounter (HOSPITAL_COMMUNITY): Payer: Self-pay | Admitting: Physician Assistant

## 2022-03-19 ENCOUNTER — Telehealth: Payer: Self-pay | Admitting: *Deleted

## 2022-03-19 NOTE — Telephone Encounter (Signed)
Pt agreeable to plan of care for the tele pre op appt 03/21/22 @ 2:40. Med rec and consent are done.

## 2022-03-19 NOTE — Telephone Encounter (Signed)
   Name: [name]  DOB: 1940/07/31  MRN: [id number]  Primary Cardiologist: Quay Burow, MD  Chart reviewed as part of pre-operative protocol coverage. Because of [name]'s past medical history and time since last visit, she will require a follow-up telephone visit in order to better assess preoperative cardiovascular risk.  Pre-op covering staff: - Please schedule appointment and call patient to inform them. If patient already had an upcoming appointment within acceptable timeframe, please add "pre-op clearance" to the appointment notes so provider is aware. - Please contact requesting surgeon's office via preferred method (i.e, phone, fax) to inform them of need for appointment prior to surgery.  No medications need held.   Elgie Collard, PA-C  03/19/2022, 1:16 PM

## 2022-03-19 NOTE — Telephone Encounter (Signed)
Pt agreeable to plan of care for the tele pre op appt 03/21/22 @ 2:40. Med rec and consent are done.     Patient Consent for Virtual Visit        [name] has provided verbal consent on 03/19/2022 for a virtual visit (video or telephone).   CONSENT FOR VIRTUAL VISIT FOR:  [name]  By participating in this virtual visit I agree to the following:  I hereby voluntarily request, consent and authorize [facility] and its employed or contracted physicians, physician assistants, nurse practitioners or other licensed health care professionals (the Practitioner), to provide me with telemedicine health care services (the "Services") as deemed necessary by the treating Practitioner. I acknowledge and consent to receive the Services by the Practitioner via telemedicine. I understand that the telemedicine visit will involve communicating with the Practitioner through live audiovisual communication technology and the disclosure of certain medical information by electronic transmission. I acknowledge that I have been given the opportunity to request an in-person assessment or other available alternative prior to the telemedicine visit and am voluntarily participating in the telemedicine visit.  I understand that I have the right to withhold or withdraw my consent to the use of telemedicine in the course of my care at any time, without affecting my right to future care or treatment, and that the Practitioner or I may terminate the telemedicine visit at any time. I understand that I have the right to inspect all information obtained and/or recorded in the course of the telemedicine visit and may receive copies of available information for a reasonable fee.  I understand that some of the potential risks of receiving the Services via telemedicine include:  Delay or interruption in medical evaluation due to technological equipment failure or disruption; Information transmitted may not be  sufficient (e.g. poor resolution of images) to allow for appropriate medical decision making by the Practitioner; and/or  In rare instances, security protocols could fail, causing a breach of personal health information.  Furthermore, I acknowledge that it is my responsibility to provide information about my medical history, conditions and care that is complete and accurate to the best of my ability. I acknowledge that Practitioner's advice, recommendations, and/or decision may be based on factors not within their control, such as incomplete or inaccurate data provided by me or distortions of diagnostic images or specimens that may result from electronic transmissions. I understand that the practice of medicine is not an exact science and that Practitioner makes no warranties or guarantees regarding treatment outcomes. I acknowledge that a copy of this consent can be made available to me via my patient portal ([facility]), or I can request a printed copy by calling the office of [facility].    I understand that my insurance will be billed for this visit.   I have read or had this consent read to me. I understand the contents of this consent, which adequately explains the benefits and risks of the Services being provided via telemedicine.  I have been provided ample opportunity to ask questions regarding this consent and the Services and have had my questions answered to my satisfaction. I give my informed consent for the services to be provided through the use of telemedicine in my medical care

## 2022-03-19 NOTE — Progress Notes (Signed)
  For Anesthesia: PCP - Grier Mitts Cardiologist -[place], [place]   Chest x-ray - 02/02/22 EKG -02/04/22 Stress Test -  11/04/13 ECHO - 11/06/21 Cardiac Cath -  08/12/21 Pacemaker/ICD device last checked: n/a Sleep Study - n/a CPAP -  n/a   Blood Thinner Instructions: Eliquis Instructions: per cards not stopping for procedure Last Dose:      Anesthesia review: Hx of CHF, HTN, Asthma, COPD, Pulm Fibrosis, DM, dyspnea on exertion wheres 3L on occasion. Developed a PE 10/30/21 and per cardiology needs to be on blood thinner for 6 months so will not be stopped for procedure. Saw pulmonology last 02/04/22.

## 2022-03-19 NOTE — Telephone Encounter (Signed)
[facility] Pre-operative Risk Assessment     Request for surgical clearance:     Endoscopy Procedure  What type of surgery is being performed?     Colonoscopy   When is this surgery scheduled?     03/26/22  What type of clearance is required ? Medical   Are there any medications that need to be held prior to surgery and how long? None, procedure to be done on Eliquis.   Practice name and name of physician performing surgery?      [facility]  What is your office phone and fax number?      Phone- [phone]  Fax775 [phone]  Anesthesia type (None, local, MAC, general) ?       MAC

## 2022-03-21 ENCOUNTER — Ambulatory Visit: Payer: Medicare Other | Attending: Cardiovascular Disease | Admitting: Nurse Practitioner

## 2022-03-21 DIAGNOSIS — S62646A Nondisplaced fracture of proximal phalanx of right little finger, initial encounter for closed fracture: Secondary | ICD-10-CM | POA: Diagnosis not present

## 2022-03-21 DIAGNOSIS — Z01810 Encounter for preprocedural cardiovascular examination: Secondary | ICD-10-CM

## 2022-03-21 NOTE — Progress Notes (Signed)
Virtual Visit via Telephone Note   Because of Beaulah F Mcdaniel's co-morbid illnesses, she is at least at moderate risk for complications without adequate follow up.  This format is felt to be most appropriate for this patient at this time.  The patient did not have access to video technology/had technical difficulties with video requiring transitioning to audio format only (telephone).  All issues noted in this document were discussed and addressed.  No physical exam could be performed with this format.  Please refer to the patient's chart for her consent to telehealth for [facility].  Evaluation Performed:  Preoperative cardiovascular risk assessment _____________   Date:  03/21/2022   Patient ID:  [name], DOB [birth year], MRN [id number] Patient Location:  Home Provider location:   Office  Primary Care Provider:  [name], MD Primary Cardiologist:  Quay Burow, MD  Chief Complaint / Patient Profile   81 y.o. y/o female with a h/o chronic diastolic heart failure, chronic dyspnea on exertion, h/o PE on Eliquis, hypertension, hyperlipidemia, type 2 diabetes, CKD, and ILD who is pending colonoscopy on 03/26/2022 with [facility] and presents today for telephonic preoperative cardiovascular risk assessment.  Past Medical History    Past Medical History:  Diagnosis Date   Acute on chronic diastolic (congestive) heart failure (HCC)    Anemia    iron deficiency anemia - secondary to blood loss ( chronic)    Anxiety    Arthritis    endstage changes bilateral knees/bilateral ankles.    Asthma    Carotid artery occlusion    Chronic fatigue    Chronic kidney disease    Closed left hip fracture (HCC)    Clotting disorder ([place])    pt denies this   Contusion of left knee    due to fall 1/14.   COPD (chronic obstructive pulmonary disease) (HCC)    pulmonary fibrosis   Depression, reactive    Diabetes mellitus    type II    Diastolic dysfunction     Difficulty in walking    Family history of heart disease    Generalized muscle weakness    Gout    High cholesterol    History of falling    Hypertension    Hypothyroidism    Interstitial lung disease ([place])    Meningioma of left sphenoid wing involving cavernous sinus ([place]) 02/17/2012   Continue diplopia, left eye pain and left headaches.     Morbid obesity ([place])    Neuromuscular disorder ([place])    diabetic neuropathy    Normal coronary arteries    cardiac catheterization performed  10/31/14   Pneumonia    RA (rheumatoid arthritis) ([place])    has been off methotreaxte since 10/13.   Spinal stenosis of lumbar region    Thyroid disease    Unspecified lack of coordination    URI (upper respiratory infection)    Past Surgical History:  Procedure Laterality Date   BRAIN SURGERY     Gamma knife 10/13. Needs repeat spring  '14   CARDIAC CATHETERIZATION N/A 10/31/2014   Procedure: Right/Left Heart Cath and Coronary Angiography;  Surgeon: [name], MD;  Location: [facility];  Service: Cardiovascular;  Laterality: N/A;   CYST EXCISION     2 on face   CYST REMOVAL NECK     ESOPHAGOGASTRODUODENOSCOPY (EGD) WITH PROPOFOL N/A 09/14/2014   Procedure: ESOPHAGOGASTRODUODENOSCOPY (EGD) WITH PROPOFOL;  Surgeon: [name], MD;  [facility];  Service: Endoscopy;  Laterality: N/A;   INCISION AND DRAINAGE HIP Left 01/16/2017   Procedure: IRRIGATION AND DEBRIDEMENT LEFT HIP;  Surgeon: [name], MD;  [facility]: [facility];  Service: Orthopedics;  Laterality: Left;   INTRAMEDULLARY (IM) NAIL INTERTROCHANTERIC Left 11/29/2016   Procedure: INTRAMEDULLARY (IM) NAIL INTERTROCHANTRIC;  Surgeon: [name], MD;  Location: [facility];  Service: Orthopedics;  Laterality: Left;   OVARY SURGERY     RIGHT HEART CATH N/A 08/12/2021   Procedure: RIGHT HEART CATH;  Surgeon: Lorretta Harp, MD;  Location: [facility];  Service: Cardiovascular;  Laterality:  N/A;   SHOULDER SURGERY Left    TONSILLECTOMY  age 37   VAGINAL HYSTERECTOMY     VIDEO BRONCHOSCOPY Bilateral 05/31/2013   Procedure: VIDEO BRONCHOSCOPY WITHOUT FLUORO;  Surgeon: Brand Males, MD;  Location: [facility];  Service: Cardiopulmonary;  Laterality: Bilateral;   video bronscoscopy  05/19/1998   lung    Allergies  Allergies  Allergen Reactions   Codeine Swelling and Other (See Comments)    Facial swelling Chest pain and swelling in legs     Infliximab Anaphylaxis    (REMICADE) "sent me into shock"    Lisinopril Swelling    Face and neck swelling     Assessment & Plan    1.  Preoperative Cardiovascular Risk Assessment:  Preop APP attempted to contact patient several times to complete preop telephone visit.  No answer.  Left voice for patient to return call, no reply.  I will have our preop coverage team reschedule the patient.  Time:   Today, I have spent 0 minutes with the patient with telehealth technology discussing medical history, symptoms, and management plan.     Lenna Sciara, NP  03/21/2022, 2:47 PM

## 2022-03-23 ENCOUNTER — Encounter: Payer: Self-pay | Admitting: Family Medicine

## 2022-03-24 ENCOUNTER — Telehealth: Payer: Self-pay | Admitting: Cardiovascular Disease

## 2022-03-24 NOTE — Telephone Encounter (Signed)
Spoke with the patient who states she was having issues with her phone and Friday and missed the telehealth visit appointment. Appointment has been rescheduled to Thursday 03/27/2022. Patient agreeable and voiced understanding.   Spoke with Laura,at the requesting office and made them aware that the patient missed her appointment and it has been reschedule to Thursday 03/27/22. [name] voiced understanding and states she will reschedule the procedure once she receives clearance.

## 2022-03-24 NOTE — Telephone Encounter (Signed)
Caller would like to know if the patient can be rescheduled for a Pre-op appointment before Wednesday, 11/9 or the surgery would need to be rescheduled.

## 2022-03-24 NOTE — Telephone Encounter (Signed)
Patient missed cardiology appointment on Friday 11/3. Cardiology stated they were able to get her rescheduled for Thursday 11/9. Called pt to let her know we would need to cancel procedure. Pt verbalized understanding.

## 2022-03-25 NOTE — Telephone Encounter (Signed)
Thank you for the update.  Patient will need to let us know when she has completed her Cardiology clearance and we can reschedule procedure

## 2022-03-26 ENCOUNTER — Ambulatory Visit (HOSPITAL_COMMUNITY): Admit: 2022-03-26 | Payer: Medicare Other | Admitting: Gastroenterology

## 2022-03-26 ENCOUNTER — Encounter (HOSPITAL_COMMUNITY): Payer: Self-pay

## 2022-03-26 ENCOUNTER — Ambulatory Visit: Payer: Medicare Other | Admitting: Speech Pathology

## 2022-03-26 ENCOUNTER — Ambulatory Visit (HOSPITAL_COMMUNITY): Admission: RE | Admit: 2022-03-26 | Payer: Medicare Other | Source: Ambulatory Visit | Admitting: Gastroenterology

## 2022-03-26 SURGERY — COLONOSCOPY WITH PROPOFOL
Anesthesia: Monitor Anesthesia Care

## 2022-03-26 NOTE — Progress Notes (Unsigned)
Virtual Visit via Telephone Note   Because of [name]'s co-morbid illnesses, she is at least at moderate risk for complications without adequate follow up.  This format is felt to be most appropriate for this patient at this time.  The patient did not have access to video technology/had technical difficulties with video requiring transitioning to audio format only (telephone).  All issues noted in this document were discussed and addressed.  No physical exam could be performed with this format.  Please refer to the patient's chart for her consent to telehealth for [facility].  Evaluation Performed:  Preoperative cardiovascular risk assessment _____________   Date:  03/26/2022   Patient ID:  [name], DOB [birth year], MRN [id number] Patient Location:  Home Provider location:   Office  Primary Care Provider:  [name], [name] Primary Cardiologist:  Joan Burow, [name]  Chief Complaint / Patient Profile   81 y.o. y/o female with a h/o HTN, DVT/PE on chronic Eliquis, chronic HFpEF, HLD, type 2 diabetes, CKD, and ILD who is pending colonoscopy and presents today for telephonic preoperative cardiovascular risk assessment.  Past Medical History    Past Medical History:  Diagnosis Date   Acute on chronic diastolic (congestive) heart failure (HCC)    Anemia    iron deficiency anemia - secondary to blood loss ( chronic)    Anxiety    Arthritis    endstage changes bilateral knees/bilateral ankles.    Asthma    Carotid artery occlusion    Chronic fatigue    Chronic kidney disease    Closed left hip fracture (HCC)    Clotting disorder ([place])    pt denies this   Contusion of left knee    due to fall 1/14.   COPD (chronic obstructive pulmonary disease) (HCC)    pulmonary fibrosis   Depression, reactive    Diabetes mellitus    type II    Diastolic dysfunction    Difficulty in walking    Family history of heart disease    Generalized muscle weakness     Gout    High cholesterol    History of falling    Hypertension    Hypothyroidism    Interstitial lung disease ([place])    Meningioma of left sphenoid wing involving cavernous sinus ([place]) 02/17/2012   Continue diplopia, left eye pain and left headaches.     Morbid obesity ([place])    Neuromuscular disorder ([place])    diabetic neuropathy    Normal coronary arteries    cardiac catheterization performed  10/31/14   Pneumonia    RA (rheumatoid arthritis) ([place])    has been off methotreaxte since 10/13.   Spinal stenosis of lumbar region    Thyroid disease    Unspecified lack of coordination    URI (upper respiratory infection)    Past Surgical History:  Procedure Laterality Date   BRAIN SURGERY     Gamma knife 10/13. Needs repeat spring  '14   CARDIAC CATHETERIZATION N/A 10/31/2014   Procedure: Right/Left Heart Cath and Coronary Angiography;  Surgeon: Joan Booze, [name];  Location: [facility];  Service: Cardiovascular;  Laterality: N/A;   CYST EXCISION     2 on face   CYST REMOVAL NECK     ESOPHAGOGASTRODUODENOSCOPY (EGD) WITH PROPOFOL N/A 09/14/2014   Procedure: ESOPHAGOGASTRODUODENOSCOPY (EGD) WITH PROPOFOL;  Surgeon: [name], [name];  [facility];  Service: Endoscopy;  Laterality: N/A;   INCISION AND DRAINAGE HIP  Left 01/16/2017   Procedure: IRRIGATION AND DEBRIDEMENT LEFT HIP;  Surgeon: [name], [name];  [facility];  Service: Orthopedics;  Laterality: Left;   INTRAMEDULLARY (IM) NAIL INTERTROCHANTERIC Left 11/29/2016   Procedure: INTRAMEDULLARY (IM) NAIL INTERTROCHANTRIC;  Surgeon: [name], [name];  Location: [facility];  Service: Orthopedics;  Laterality: Left;   OVARY SURGERY     RIGHT HEART CATH N/A 08/12/2021   Procedure: RIGHT HEART CATH;  Surgeon: Joan Harp, [name];  Location: [facility];  Service: Cardiovascular;  Laterality: N/A;   SHOULDER SURGERY Left    TONSILLECTOMY  age 73   VAGINAL HYSTERECTOMY     VIDEO  BRONCHOSCOPY Bilateral 05/31/2013   Procedure: VIDEO BRONCHOSCOPY WITHOUT FLUORO;  Surgeon: Joan Males, [name];  Location: [facility];  Service: Cardiopulmonary;  Laterality: Bilateral;   video bronscoscopy  05/19/1998   lung    Allergies  Allergies  Allergen Reactions   Codeine Swelling and Other (See Comments)    Facial swelling Chest pain and swelling in legs     Infliximab Anaphylaxis    (REMICADE) "sent me into shock"    Lisinopril Swelling    Face and neck swelling     History of Present Illness    [name] is a 81 y.o. female who presents via audio/video conferencing for a telehealth visit today.  Pt was last seen in cardiology clinic on 01/28/2022 by Dr. Gwenlyn Mcdaniel.  At that time [name] had elevated BP and was referred to Pharm D for hypertension clinic. At office visit on 02/25/22 with [name], RPH, BP remained elevated. She was advised to follow a more consistent schedule of taking medications and checking BP. No medication changes were made. The patient is now pending procedure as outlined above. Since her last visit, she denies chest pain, shortness of breath, lower extremity edema, fatigue, palpitations, melena, hematuria, hemoptysis, diaphoresis, weakness, presyncope, syncope, orthopnea, and PND. She reports home BP has been well-controlled at  130-135/70.  She is physically limited to, however she does some walking in her home and some moderate housework as well as grocery shopping.  Home Medications    Prior to Admission medications   Medication Sig Start Date End Date Taking? Authorizing Provider  acetaminophen (TYLENOL) 500 MG tablet Take 500-1,000 mg by mouth every 6 (six) hours as needed for moderate pain.    Provider, Historical, [name]  albuterol (PROVENTIL) (2.5 MG/3ML) 0.083% nebulizer solution Take 3 mLs (2.5 mg total) by nebulization every 6 (six) hours as needed for wheezing. 05/03/21   [name], [name], [name]  allopurinol (ZYLOPRIM)  300 MG tablet Take 1 tablet (300 mg total) by mouth daily. 05/03/21   [name], [name], [name]  amLODipine (NORVASC) 10 MG tablet Take 10 mg by mouth daily. 02/12/22   Provider, Historical, [name]  apixaban (ELIQUIS) 5 MG TABS tablet Take 1 tablet (5 mg total) by mouth 2 (two) times daily. 11/27/21   Joan Harp, [name]  Artificial Tear Ointment (DRY EYES OP) Place 1 drop into both eyes 2 (two) times daily as needed (dry eyes).    Provider, Historical, [name]  aspirin 81 MG chewable tablet Chew 1 tablet by mouth daily. Patient not taking: Reported on 03/11/2022    Provider, Historical, [name]  atorvastatin (LIPITOR) 20 MG tablet Take 0.5 tablets (10 mg total) by mouth every evening. Patient taking differently: Take 20 mg by mouth every evening. 12/09/21   Joan Ruddy, [name]  azithromycin (ZITHROMAX) 250 MG tablet  Take 250 mg by mouth 3 (three) times a week. Patient not taking: Reported on 03/11/2022 11/23/21   Provider, Historical, [name]  Calcium Carb-Cholecalciferol (CALCIUM + D3 PO) Take 2 tablets by mouth daily with lunch.     Provider, Historical, [name]  Cholecalciferol (VITAMIN D-3) 25 MCG (1000 UT) CAPS Take 2 capsules (2,000 Units total) by mouth daily. GIVE ONE TAB PO Q DAY 05/03/21   [name], [name], [name]  colchicine 0.6 MG tablet Take 1 tablet (0.6 mg total) by mouth daily as needed (gout flares). 05/03/21   [name], [name], [name]  cyclobenzaprine (FLEXERIL) 5 MG tablet Take 1 tablet (5 mg total) by mouth at bedtime as needed for muscle spasms. 12/02/21   Joan Hugger, [name]  diclofenac Sodium (VOLTAREN) 1 % GEL Apply 2 g topically 3 (three) times daily. Apply to both hands and knees 05/03/21   [name], [name], [name]  donepezil (ARICEPT) 5 MG tablet Take 1 tablet (5 mg total) by mouth at bedtime. 03/06/22   Joan Beam, [name]  Dulaglutide (TRULICITY) 5.92 TW/4.4QK SOPN Inject 0.75 mg into the skin once a week. 12/09/21   Joan Ruddy, [name]  DULoxetine (CYMBALTA) 60 MG capsule Take 1  capsule (60 mg total) by mouth daily. 12/02/21   Joan Hugger, [name]  fluticasone (FLONASE) 50 MCG/ACT nasal spray Place 2 sprays into both nostrils daily. 12/09/21   Joan Ruddy, [name]  furosemide (LASIX) 80 MG tablet Take 1 tablet (80 mg total) by mouth daily. 08/02/21   Joan Harp, [name]  gabapentin (NEURONTIN) 600 MG tablet Take 2 tablets (1,200 mg total) by mouth 3 (three) times daily. 12/02/21   Joan Hugger, [name]  guaiFENesin (MUCINEX) 600 MG 12 hr tablet Take 2 tablets (1,200 mg total) by mouth 2 (two) times daily. 05/03/21   [name], [name], [name]  hydrALAZINE (APRESOLINE) 25 MG tablet Take 1 tablet (25 mg total) by mouth 2 (two) times daily. 02/25/22   Joan Harp, [name]  hydroxychloroquine (PLAQUENIL) 200 MG tablet Take 1 tablet (200 mg total) by mouth daily. 05/03/21   [name], [name], [name]  leflunomide (ARAVA) 20 MG tablet Take 1 tablet (20 mg total) by mouth daily. 05/03/21 08/09/23  [name], [name], [name]  levothyroxine (SYNTHROID) 50 MCG tablet Take 1 tablet (50 mcg total) by mouth daily. 09/25/21   Joan Ruddy, [name]  linaclotide Rolan Lipa) 72 MCG capsule Take 1 capsule (72 mcg total) by mouth daily before breakfast. 12/09/21   Joan Ruddy, [name]  metolazone (ZAROXOLYN) 2.5 MG tablet TAKE 1 TABLET BY MOUTH DAILY AS NEEDED FOR 2 DAYS FOR WEIGHT ABOVE 255 POUNDS 05/03/21   [name], [name], [name]  metoprolol succinate (TOPROL-XL) 100 MG 24 hr tablet Take 1 tablet by mouth once daily along with 1 metoprolol 16m tablet 02/25/22   BLorretta Harp [name]  metoprolol succinate (TOPROL-XL) 25 MG 24 hr tablet Take 5 tablets (125 mg total) by mouth daily. 12/09/21   BBillie Ruddy [name]  morphine (MS CONTIN) 15 MG 12 hr tablet Take 1 tablet (15 mg total) by mouth every 12 (twelve) hours. Do Not Fill Before 03/02/2022 01/28/22   TBayard Hugger [name]  morphine (MSIR) 15 MG tablet Take 1 tablet (15 mg total) by mouth 2 (two) times daily. 01/28/22   TBayard Hugger [name]   Multiple Vitamin (MULTIVITAMIN WITH MINERALS) TABS Take 1 tablet by mouth daily.     Provider, Historical, [name]  Nintedanib (OFEV) 100  MG CAPS Take 1 capsule (100 mg total) by mouth 2 (two) times daily. Patient not taking: Reported on 03/11/2022 09/18/21   Joan Males, [name]  olmesartan (BENICAR) 40 MG tablet Take 1 tablet (40 mg total) by mouth daily. 02/10/22   Joan Ruddy, [name]  ondansetron (ZOFRAN-ODT) 4 MG disintegrating tablet Take 1 tablet (4 mg total) by mouth every 8 (eight) hours as needed for nausea or vomiting. 03/01/22   Joan Sorrow, [name]  OXYGEN-HELIUM IN Inhale 3 L into the lungs at bedtime. And on exertion    Provider, Historical, [name]  pantoprazole (PROTONIX) 40 MG tablet take 1 tablet by mouth once daily MINUTES BEFORE 1ST MEAL OF THE DAY 09/25/21   Joan Ruddy, [name]  Potassium Chloride ER 20 MEQ TBCR Take 20 mEq by mouth daily. 01/08/22   Joan Ruddy, [name]  predniSONE (DELTASONE) 10 MG tablet Take 1 tablet (10 mg total) by mouth daily with breakfast. 05/22/21   Joan Ruddy, [name]  sodium chloride (OCEAN) 0.65 % SOLN nasal spray Place 1 spray into both nostrils as needed for congestion.    Provider, Historical, [name]  traMADol (ULTRAM) 50 MG tablet Take by mouth. Patient not taking: Reported on 03/11/2022    Provider, Historical, [name]    Physical Exam    Vital Signs:  Starlit F Kisamore does not have vital signs available for review today.  Given telephonic nature of communication, physical exam is limited. AAOx3. NAD. Normal affect.  Speech and respirations are unlabored.  Accessory Clinical Findings    None  Assessment & Plan    1.  Preoperative Cardiovascular Risk Assessment: The patient is doing well from a cardiac perspective. Therefore, based on ACC/AHA guidelines, the patient would be at acceptable risk for the planned procedure without further cardiovascular testing. According to the Revised Cardiac Risk Index (RCRI), her Perioperative Risk of Major Cardiac  Event is (%): 0.9. Her Functional Capacity in METs is: 4.4 according to the [place] Activity Status Index (DASI).  The patient was advised that if she develops new symptoms prior to surgery to contact our office to arrange for a follow-up visit, and she verbalized understanding.  Procedure will be done on Eliquis.   A copy of this note will be routed to requesting surgeon.  Time:   Today, I have spent 10 minutes with the patient with telehealth technology discussing medical history, symptoms, and management plan.     Emmaline Life, [name]-Mcdaniel  03/27/2022, 2:24 PM [address], [address] Office [phone] Fax [phone]

## 2022-03-27 ENCOUNTER — Encounter: Payer: Self-pay | Admitting: Speech Pathology

## 2022-03-27 ENCOUNTER — Other Ambulatory Visit: Payer: Self-pay

## 2022-03-27 ENCOUNTER — Ambulatory Visit: Payer: Medicare Other | Attending: Neurology | Admitting: Speech Pathology

## 2022-03-27 ENCOUNTER — Encounter: Payer: Self-pay | Admitting: Nurse Practitioner

## 2022-03-27 ENCOUNTER — Ambulatory Visit: Payer: Medicare Other | Attending: Internal Medicine | Admitting: Nurse Practitioner

## 2022-03-27 DIAGNOSIS — R1310 Dysphagia, unspecified: Secondary | ICD-10-CM | POA: Diagnosis not present

## 2022-03-27 DIAGNOSIS — R41841 Cognitive communication deficit: Secondary | ICD-10-CM | POA: Diagnosis not present

## 2022-03-27 DIAGNOSIS — Z01810 Encounter for preprocedural cardiovascular examination: Secondary | ICD-10-CM | POA: Diagnosis not present

## 2022-03-27 DIAGNOSIS — R4701 Aphasia: Secondary | ICD-10-CM | POA: Insufficient documentation

## 2022-03-27 NOTE — Patient Instructions (Signed)
How to Practice at [place] around the rooms in your home and name each object you see.  Go outside and name what you see. Open drawers, cabinets, closets and name the items you see. Look around and name items that begin with a certain letter. Look around and name items that are a certain color.   PHRASE/SENTENCE LEVEL Use a carrier phrase as you name items around your house/apartment, such as: "This is my _____", "I have _____", "Where is my _____".  Describe what you are doing during your daily routine or chores.  Bathroom: Brushing teeth, washing face Dressing: Items of clothing you want to wear Kitchen: Name the item you are putting away from the sink or dishwasher. Say the steps for making food.  Laundry: Name the item you are folding/putting away.  Look through pictures and describe who, what, where, when, and why for each picture.   READING Reading out loud is a good way to practice your speech. You can read: Newspapers, magazines, recipes, letters, bills, text messages/emails   WRITING Write the names of items around your house, to-do lists, grocery lists, medication logs, or daily activity logs. Write down your family member's names and organize it into a family tree Write down common personal phrases you usually say.  Write a summary of a news story or short video you watch online or on TV.   *Created by [employer] (2019)

## 2022-03-27 NOTE — Therapy (Signed)
OUTPATIENT SPEECH LANGUAGE PATHOLOGY EVALUATION   Patient Name: [name] MRN: [id number] DOB:December 17, 1940, 81 y.o., female Today's Date: 03/27/2022  PCP: Dr Grier Mitts REFERRING PROVIDER: Dr. Sarina Ill   End of Session - 03/27/22 1627     Visit Number 1    Number of Visits 17    Date for SLP Re-Evaluation 05/22/22    Authorization Type Medicare    Progress Note Due on Visit 10    SLP Start Time 1327   pt arrived late   SLP Stop Time  1402    SLP Time Calculation (min) 35 min    Activity Tolerance Patient tolerated treatment well             Past Medical History:  Diagnosis Date   Acute on chronic diastolic (congestive) heart failure (HCC)    Anemia    iron deficiency anemia - secondary to blood loss ( chronic)    Anxiety    Arthritis    endstage changes bilateral knees/bilateral ankles.    Asthma    Carotid artery occlusion    Chronic fatigue    Chronic kidney disease    Closed left hip fracture (HCC)    Clotting disorder ([place])    pt denies this   Contusion of left knee    due to fall 1/14.   COPD (chronic obstructive pulmonary disease) (HCC)    pulmonary fibrosis   Depression, reactive    Diabetes mellitus    type II    Diastolic dysfunction    Difficulty in walking    Family history of heart disease    Generalized muscle weakness    Gout    High cholesterol    History of falling    Hypertension    Hypothyroidism    Interstitial lung disease ([place])    Meningioma of left sphenoid wing involving cavernous sinus ([place]) 02/17/2012   Continue diplopia, left eye pain and left headaches.     Morbid obesity ([place])    Neuromuscular disorder ([place])    diabetic neuropathy    Normal coronary arteries    cardiac catheterization performed  10/31/14   Pneumonia    RA (rheumatoid arthritis) ([place])    has been off methotreaxte since 10/13.   Spinal stenosis of lumbar region    Thyroid disease    Unspecified lack of coordination    URI (upper  respiratory infection)    Past Surgical History:  Procedure Laterality Date   BRAIN SURGERY     Gamma knife 10/13. Needs repeat spring  '14   CARDIAC CATHETERIZATION N/A 10/31/2014   Procedure: Right/Left Heart Cath and Coronary Angiography;  Surgeon: [name], MD;  Location: [facility];  Service: Cardiovascular;  Laterality: N/A;   CYST EXCISION     2 on face   CYST REMOVAL NECK     ESOPHAGOGASTRODUODENOSCOPY (EGD) WITH PROPOFOL N/A 09/14/2014   Procedure: ESOPHAGOGASTRODUODENOSCOPY (EGD) WITH PROPOFOL;  Surgeon: [name], MD;  Location: [facility];  Service: Endoscopy;  Laterality: N/A;   INCISION AND DRAINAGE HIP Left 01/16/2017   Procedure: IRRIGATION AND DEBRIDEMENT LEFT HIP;  Surgeon: [name], MD;  Location: [facility];  Service: Orthopedics;  Laterality: Left;   INTRAMEDULLARY (IM) NAIL INTERTROCHANTERIC Left 11/29/2016   Procedure: INTRAMEDULLARY (IM) NAIL INTERTROCHANTRIC;  Surgeon: [name], MD;  Location: [facility];  Service: Orthopedics;  Laterality: Left;   OVARY SURGERY     RIGHT HEART CATH N/A 08/12/2021   Procedure: RIGHT HEART  CATH;  Surgeon: Lorretta Harp, MD;  Location: [facility];  Service: Cardiovascular;  Laterality: N/A;   SHOULDER SURGERY Left    TONSILLECTOMY  age 58   VAGINAL HYSTERECTOMY     VIDEO BRONCHOSCOPY Bilateral 05/31/2013   Procedure: VIDEO BRONCHOSCOPY WITHOUT FLUORO;  Surgeon: Brand Males, MD;  Location: [facility];  Service: Cardiopulmonary;  Laterality: Bilateral;   video bronscoscopy  05/19/1998   lung   Patient Active Problem List   Diagnosis Date Noted   Pulmonary embolism ([place]) 11/15/2021   Tachycardia 10/22/2021   Pulmonary HTN ([place])    Unspecified protein-calorie malnutrition ([place]) 04/05/2021   Pressure injury of skin 03/31/2021   UTI (urinary tract infection) 03/31/2021   Sepsis due to urinary tract infection ([place]) 03/30/2021   Acute encephalopathy 03/30/2021    Thrombocytopenia ([place]) 03/30/2021   Diabetic neuropathy ([place]) 05/24/2019   Hypokalemia 04/21/2019   Anticoagulated 06/25/2018   Chronic pain 06/25/2018   CRI (chronic renal insufficiency), stage 3 (moderate) (HCC) 06/25/2018   Drug-induced constipation 06/18/2018   DVT (deep venous thrombosis) ([place]) 06/03/2018   Primary osteoarthritis of both knees 05/26/2018   Body mass index (BMI) of 50-59.9 in adult ([place]) 05/26/2018   Acquired hypothyroidism 05/26/2018   Chronic bilateral low back pain without sciatica 02/17/2018   Astigmatism with presbyopia, bilateral 03/26/2017   Cortical age-related cataract of both eyes 03/26/2017   Family history of glaucoma 03/26/2017   Long term current use of oral hypoglycemic drug 03/26/2017   Nuclear sclerotic cataract of both eyes 03/26/2017   Pain in left hip 02/18/2017   Closed nondisplaced intertrochanteric fracture of left femur with delayed healing 01/14/2017   Gout 11/29/2016   Depression 11/29/2016   Closed left hip fracture ([place]) 11/29/2016   GERD (gastroesophageal reflux disease) 11/29/2016   Closed intertrochanteric fracture of hip, left, initial encounter ([place]) 11/29/2016   Physical deconditioning 07/31/2015   Pulmonary fibrosis, postinflammatory ([place]) 07/31/2015   Bronchiectasis without complication ([place]) 14/43/1540   Tendinitis of left rotator cuff 06/13/2015   Chronic diastolic CHF (congestive heart failure) ([place]) 04/21/2015   Acute kidney injury superimposed on chronic kidney disease ([place]) 04/17/2015   Chronic respiratory failure with hypoxia ([place]) 02/09/2015   Dyspnea and respiratory abnormality 01/03/2015   Normal coronary arteries 11/02/2014   Morbid obesity ([place]) 10/30/2014   Dysphagia 08/21/2014   Chronic fatigue 06/21/2014   DOE (dyspnea on exertion) 05/24/2014   Primary osteoarthritis of left knee 04/26/2014   High risk medication use 04/17/2014   Aphasia 02/12/2014   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 ([place])  01/18/2014   Lumbar facet arthropathy 01/18/2014   Bilateral edema of lower extremity 05/30/2013   Chronic cough 05/24/2013   ILD (interstitial lung disease) ([place]) 04/10/2013   Spinal stenosis of lumbar region with radiculopathy 07/12/2012   Inability to walk 07/12/2012   Meningioma of left sphenoid wing involving cavernous sinus ([place]) 02/17/2012   Chest pain of uncertain etiology 08/67/6195   Rheumatoid arthritis ([place]) 01/28/2012   Primary osteoarthritis of right knee 01/28/2012   Dyslipidemia    Thyroid disease    Essential hypertension     ONSET DATE: referral 03-09-22   REFERRING DIAG:  R41.89 (ICD-10-CM) - Cognitive decline  I67.9 (ICD-10-CM) - Cerebrovascular disease    THERAPY DIAG:  Aphasia  Cognitive communication deficit  Dysphagia, unspecified type  Rationale for Evaluation and Treatment: Rehabilitation  SUBJECTIVE:   SUBJECTIVE STATEMENT: "I have trouble with saying what I want" Pt accompanied by:  daughter, Roanna Epley  PERTINENT HISTORY: chronic pain medication, hypertension, congestive heart failure, DVT, pulmonary hypertension, pulmonary embolism, diabetes mellitus with sensory neuropathy, thyroid disease, acquired hypothyroidism, diabetic neuropathy, meningioma of left sphenoid wing involving cavernous sinus, spinal stenosis, acute encephalopathy, rheumatoid arthritis, widespread osteoarthritis, chronic kidney disease, UTIs, thrombocytopenia, dyslipidemia, morbid obesity, inability to walk which is chronic, bilateral lower extremity edema, aphasia, chronic fatigue, dyspnea and respiratory abnormality, physical deconditioning, depression, anticoagulated, chronic pain, sepsis due to UTI, unspecified protein calorie malnutrition, tachycardia, anemia, anxiety, asthma, carotid artery occlusion, COPD, depression, reactive, history of falls, gout, lack of coordination unspecified.  PAIN:  Are you having pain? Yes: NPRS scale: 6/10 Pain location: back, hips, knees, R arm    FALLS: Has patient fallen in last 6 months?  Yes, Comment: yesterday  LIVING ENVIRONMENT: Lives with: lives with their spouse and son Lives in: House/apartment  PLOF:  Level of assistance: Needed assistance with IADLS Employment: Retired  PATIENT GOALS: increase ease of communication  OBJECTIVE:   DIAGNOSTIC FINDINGS: Per Dr. Jaynee Eagles PN Assessment [id number], "Very complicated patient likely with a mixed MCI of vascular type but I believe less likely alzheimers (MRI showed Advanced chronic small vessel ischemic disease, Multifocal peripheral predominant bilateral chronic microhemorrhage may indicate cerebral amyloid angiopathy). MMSE 22/30."  COGNITION: Overall cognitive status: Impaired Areas of impairment:  Memory: Impaired: Working Short term Long term Chartered loss adjuster function: Impaired: Slow processing Functional deficits: decreased recall of conversations, appointments/schedule  COGNITIVE COMMUNICATION: Auditory comprehension: WFL Verbal expression: Impaired: occasional word finding. Evidenced x4 over today's session with repair given extended time. Pt reports happens at home with repair occurring "most of the time." Is not employing any additional strategies. Report x1 occurrence 2 months ago when pt demonstrated nonsensical speech- did go to ER, no acute stroke ID on imaging. Speech resolved within that day.  Functional communication: Impaired: word finding impacts cohesiveness of discourse.   ORAL MOTOR EXAMINATION: Overall status: Did not assess Comments: will assess next session  STANDARDIZED ASSESSMENTS: Deferred d/t time, will complete initial therapy session  PATIENT REPORTED OUTCOME MEASURES (PROM): Deferred d/t time, will complete initial therapy session   TODAY'S TREATMENT:  03-27-22: SLP provided education on observations and recommendations. Collaborated with pt and daughter to generate plan of care and goals to aid in maximizing  communication efficacy with variety of communication partners and recall of important information. Pt and daughter in agreement with POC, deny questions at conclusion of session.     PATIENT EDUCATION: Education details: see above Person educated: Patient and Child(ren) Education method: Customer service manager Education comprehension: verbalized understanding, returned demonstration, and needs further education   GOALS: Goals reviewed with patient? Yes  SHORT TERM GOALS: Target date: 04/24/2022  Pt will complete clinical swallow evaluation initial session Baseline: Goal status: INITIAL  2.  Pt will complete communication patient reported outcome measure initial session Baseline:  Goal status: INITIAL  3.  Pt will demonstrate anomia strategies in 80% of opportunities with occasional mod-A during structured tasks over 2 sessions  Baseline:  Goal status: INITIAL  4.  Pt will implement external cognitive aids resulting in recall of details relevant to planned appointments, visitors, or conversations with family members with usual verbal cues from family members  Baseline:  Goal status: INITIAL   LONG TERM GOALS: Target date: 05/22/2022  Pt will teach back and demonstrate swallowing compensations and strategies with occasional min-A Baseline:  Goal status: INITIAL  2.  Pt or care partner will report subjective perception of improved ability to recall important,  functional details, with use of memory compensations over 1 week period Baseline:  Goal status: INITIAL  3.  Pt will complete moderately complex structured language tasks, with use of anomia compensations PRN, with 90% accuracy given rare min-A Baseline:  Goal status: INITIAL  4.  Pt will report improvement via patient reported outcome measure by d/c Baseline:  Goal status: INITIAL  ASSESSMENT:  CLINICAL IMPRESSION: Patient is a 81 y.o. F who was seen today for cognitive linguistic evaluation in presence of  MCI. Pt presents with mild expressive aphasia, characterized by occasional word finding with negative impact on discourse cohesion. Pt not currently employing anomia strategies, reports is able to find word most of time with extended time. Pt briefly reports difficulty swallowing, SLP to complete clinical swallow evaluation at initial therapy session, dysphagia goals may be added based on that evalaution. Skilled ST is recommended.    OBJECTIVE IMPAIRMENTS: include memory, aphasia, and dysphagia. These impairments are limiting patient from household responsibilities, ADLs/IADLs, effectively communicating at home and in community, and safety when swallowing. Factors affecting potential to achieve goals and functional outcome are ability to learn/carryover information, cooperation/participation level, and previous level of function. Patient will benefit from skilled SLP services to address above impairments and improve overall function.  REHAB POTENTIAL: Fair MCI  PLAN:  SLP FREQUENCY: 1-2x/week (pt elects 1x week at this time d/t frequency of other appointments)  SLP DURATION: 8 weeks  PLANNED INTERVENTIONS: Aspiration precaution training, Pharyngeal strengthening exercises, Diet toleration management , Language facilitation, Cueing hierachy, Cognitive reorganization, Internal/external aids, Functional tasks, SLP instruction and feedback, Compensatory strategies, and Patient/family education    Su Monks, CCC-SLP 03/27/2022, 4:28 PM

## 2022-03-28 ENCOUNTER — Telehealth: Payer: Self-pay

## 2022-03-28 NOTE — Telephone Encounter (Signed)
   Reason for call: ED-Follow up call   Patient visit on 03/01/2022 at [facility] was for Emesis  Have you been able to follow up with your primary care physician? - Yes  The patient was or was not able to obtain any needed medicine or equipment. - Was, Yes  Are there diet recommendations that you are having difficulty following? - No  Patient expresses understanding of discharge instructions and education provided has no other needs at this time.   [facility]  [place], [place]  Main Phone: [phone]  E-mail: [email or web address]  Website: [email or web address]

## 2022-03-31 ENCOUNTER — Encounter: Payer: Medicare Other | Attending: Physical Medicine & Rehabilitation | Admitting: Registered Nurse

## 2022-03-31 ENCOUNTER — Other Ambulatory Visit: Payer: Self-pay | Admitting: *Deleted

## 2022-03-31 ENCOUNTER — Encounter: Payer: Self-pay | Admitting: Registered Nurse

## 2022-03-31 VITALS — BP 104/72 | HR 94 | Ht 64.0 in | Wt 224.0 lb

## 2022-03-31 DIAGNOSIS — M25561 Pain in right knee: Secondary | ICD-10-CM | POA: Diagnosis not present

## 2022-03-31 DIAGNOSIS — Z5181 Encounter for therapeutic drug level monitoring: Secondary | ICD-10-CM | POA: Insufficient documentation

## 2022-03-31 DIAGNOSIS — W19XXXD Unspecified fall, subsequent encounter: Secondary | ICD-10-CM | POA: Diagnosis not present

## 2022-03-31 DIAGNOSIS — M170 Bilateral primary osteoarthritis of knee: Secondary | ICD-10-CM | POA: Insufficient documentation

## 2022-03-31 DIAGNOSIS — M25562 Pain in left knee: Secondary | ICD-10-CM | POA: Diagnosis not present

## 2022-03-31 DIAGNOSIS — M5416 Radiculopathy, lumbar region: Secondary | ICD-10-CM | POA: Insufficient documentation

## 2022-03-31 DIAGNOSIS — G894 Chronic pain syndrome: Secondary | ICD-10-CM | POA: Diagnosis not present

## 2022-03-31 DIAGNOSIS — M7061 Trochanteric bursitis, right hip: Secondary | ICD-10-CM | POA: Diagnosis not present

## 2022-03-31 DIAGNOSIS — Z79891 Long term (current) use of opiate analgesic: Secondary | ICD-10-CM | POA: Diagnosis not present

## 2022-03-31 DIAGNOSIS — M5412 Radiculopathy, cervical region: Secondary | ICD-10-CM | POA: Insufficient documentation

## 2022-03-31 DIAGNOSIS — M2550 Pain in unspecified joint: Secondary | ICD-10-CM | POA: Insufficient documentation

## 2022-03-31 DIAGNOSIS — Y92009 Unspecified place in unspecified non-institutional (private) residence as the place of occurrence of the external cause: Secondary | ICD-10-CM | POA: Insufficient documentation

## 2022-03-31 DIAGNOSIS — Z760 Encounter for issue of repeat prescription: Secondary | ICD-10-CM | POA: Insufficient documentation

## 2022-03-31 DIAGNOSIS — M7062 Trochanteric bursitis, left hip: Secondary | ICD-10-CM | POA: Insufficient documentation

## 2022-03-31 DIAGNOSIS — M542 Cervicalgia: Secondary | ICD-10-CM | POA: Insufficient documentation

## 2022-03-31 MED ORDER — MORPHINE SULFATE 15 MG PO TABS: 15.0000 mg | ORAL_TABLET | Freq: Two times a day (BID) | ORAL | 0 refills | Status: DC

## 2022-03-31 MED ORDER — MORPHINE SULFATE ER 15 MG PO TBCR: 15.0000 mg | EXTENDED_RELEASE_TABLET | Freq: Two times a day (BID) | ORAL | 0 refills | Status: DC

## 2022-03-31 NOTE — Progress Notes (Unsigned)
OV 01/03/2015  Chief Complaint  Patient presents with   Follow-up    Pt c/o DOE, resolving cough that started over the weekend, chest discomfort when SOB and when lying down. Pt c/o increasing fatigue.    She has severe class 3 dyspnea with hypoxemia on account of BMI 50, diaast dsyfn, chronic pain, deconditioning and ILD due to RA on celcept/pred/bactrim   I personally last saw her in march 2-016. Since then has seen other provicers in this office and most recently in June 2016. In June 2016 admitted for acute CHF - cath showed mild pulm htn only with PA mean 24 and minimal CAD (reviewed chart of procedures June 2016). Dx as acute diast chf. Since then better. SHe had fu HRCT 11/29/14 that shows progression in ILD since march 2016 and now with UIP pattern (personally visualized image). She is finally accepting of fact that ILD is not makin cause of dyspnea and obesity is main problem. She is wondering about dc cellcept/pred/bactrim so as to minimize polypharmacy and side effect profile    OV 02/09/2015  Chief Complaint  Patient presents with   Follow-up    Pt states her breathing is doing well the past few days. Pt recently went to [facility] ED for CP. Pt c/o prod cough with tan mucus. Pt denies CP/tightness    She has severe class 3 dyspnea with hypoxemia on account of BMI 50, diaast dsyfn, chronic pain, deconditioning and ILD due to RA. She is having progressive ILD. But given her miserable quality of life and polypharmacy and other side effects we decided to stop the CellCept and Bactrim. We also advise prednisone taper. This was all in the end of August 2016. But on 01/27/2015 she ended up in the emergency room with some respiratory difficulty. Her prednisone was bumped up. She subsequently saw her rheumatologist [name] was now tapering her prednisone again. There is also some concerns of headaches and possibly temporal arteritis based on her description and apparently some kind of a biopsy  scheduled. She feels that since stopping her CellCept and Bactrim her cough is returned. Nevertheless weight and knee pain and back pain continued to be major issues for her along with polypharmacy. Her Polysorb was includes MS Contin  [name]    OV 02/21/2015  - a ACUTE VISIT  Chief Complaint  Patient presents with   Acute Visit    Pt c/o of chest tightness, productive cough with white/yellow mucus, and wheezing. Pt also c/o of feeling abdominal swelling/distention. Pt also c/o of constant fatigue. Pt states symptoms have been present x1 week.     Acute visit for this morbidly obese, rheumatoid arthritis patient with ILD who is now off CellCept  I saw her as recently as 02/09/2015. She reports that for the past 1 week she's having increased chest congestion, chest tightness, wheezing, shortness of breath, cough and yellow  sputum more than baseline. Symptoms are rated as moderate in severity. Relieved by inhaler. She has tried Mucinex for 1 week with some initial partial relief but symptoms progressed. So she made an acute visit.  She continues to stay off CellCept. She is not on prednisone 10 mg per day. She tells me [name] has held her prednisone at the dose because of concerns of temporal arteritis but apparently now that his diagnoses being eliminated in the differential diagnosis. She will see [name] in the next few weeks to decide on further prednisone taper.  OV 04/16/2015  Chief Complaint  Patient presents with   Follow-up    Pt states she felt much better after completing the abx and pred. Pt states her SOB is at basline, prod cough with yellow mucus at times, pt c/o chest tightness.     Morbidly obese female with rheumatoid arthritis with interstitial lung disease UIP pattern with associated deconditioning and diastolic dysfunction  She continues to remain off CellCept. Last seen in October 2016. At that time I discharged her from the office with  doxycycline and prednisone taper. After that early November 2016 she was given another course of Doxy and prednisone burst. This did not help. Sputum culture at this time showed normal flora. She called again with the middle of November was given Levaquin and prednisone burst. She feels Levaquin helped her immensely. At this point in time she is reporting progressive dyspnea and also worsening arthritis. She wants to go back on immunomodulators again. She has discussed Imuran with [name]. She is due to see [name] tomorrow. She also feels that she is unable to come off prednisone. She feels at a minimum she needs 10 mg of prednisone per day. She will discuss this with Dr. D her rheumatologist tomorrow. She's also interested in a second opinion at [facility] interstitial lung disease clinic which I did discuss with her most recently. She continues to use oxygen.     OV 07/17/2015  Chief Complaint  Patient presents with   Follow-up    Pt states that she has improved. Pt c/o continued SOB with exertion, some ocugh and wheeze, and some intermittent chest tightness. Pt states that she does sleep a lot. Pt states that her heart rate remains high and that cardiology said it was due to her lungs. Pt is currently on Lasix, 60m, but still seems to retain a lot of fluid and is not seeming to go to the bathroom as frequently.    Follow-up interstitial lung disease secondary to rheumatoid arthritis in the setting of morbid obesity, poor quality of life and heavy chronic pain opioid use and depressive symptoms  Last seen November 2016. Since then she has seen [name] rheumatology clinic. She was given instructions on Imuran which she has red but she has not decided whether she should take it or not. She subsequently has followed up with [facility] interstitial lung disease clinic. CT scan of the chest reviewed result shows possible UIP pattern 06/12/2015. She had autoimmune profile which was strongly positive for both rheumatoid  factor and cyclic citrulline peptide both markers for rheumatoid arthritis. She did see Dr. CMargreta JourneyB on 07/02/2015. It appears that the note is not complete but according to the patient was supposed to be a multidisciplinary conference and they will get back to her. It is possible a second immunomodulators agent and is being contemplated but patient is leery of this given prior issues with opportunistic infection not otherwise specified and admissions to the hospital. In the interim she is also seen cardiology Janr 2017 Dr. BGwenlyn Foundwho did not feel dyspnea was cardiac related. I reviewed his note  OV 10/17/2015  Chief Complaint  Patient presents with   Follow-up    Pt reports she feels improved. Pt c/o continued SOB with exertion, some wheeze and occasional cough. Pt denies CP/tightness.     Follow-up interstitial lung disease secondary to rheumatoid arthritis in the setting of morbid obesity, poor quality of life and heavy chronic pain opioid use and depressive symptoms  Since seeing me last in February 2017. She followed up at [facility] again pulmonary clinic in March 2017. I reviewed those notes. She saw Dr. Margreta Journey B on 07/31/2015. Due to the presence of bronchiectasis she's been started on  azithromycin 3 times a week. She says this has helped. In addition due to physical deconditioning and obesity she is getting home physical therapy is also help. She is interested in pulmonary rehabilitation although given obesity and back pain can be challenging. She nevertheless wants to try. She is on daily prednisone 10 mg a day. She met with [name] in the rheumatology clinic and has been referred now to [facility] rheumatology clinic for second opinion for a second immunomodulators. She continues to be morbidly obese and has been losing weight. She is somewhat interested in visiting with a [facility] for weight loss.   OV 02/18/2016  Chief Complaint  Patient presents with   Follow-up     Pt states she did not go to pulm rehab d/t BIL knee pain. Pt denies change in SOB, significant cough, CP/tightness and f/c/s. Pt states she is exercising at home. pt states she is doing well overall.    Problem List: 1.  Extensive bronchiectasis - cylindrical vs. Traction - has been present and not significantly changed from 2014 to 2017                       - has history of hospitalization for what sounds like pneumonia vs. Bronchiectasis flare as far back as 1999; underwent bronchoscopy which made her feel "like a new woman" in 1999 2.  Pulmonary fibrosis - basilar predominant reticular opacities that have not change from 2014 to 2017 3.  Rheumatoid arthritis: RF 959 ; CCP >300                       - has been treated with methotrexate, biologics, etc in the past - none in the past several years                       - currently on 7.5/90m alter day 4.  Morbid obesity with deconditioning - has had several hospitalizations in recent months; very deconditioned at present time  - [facility]     Followup for above  - [facility]. [facility] Review of the notes suggest that they have her on a slightly lower dose of prednisone with intent to taper to 5 mg per day. He also have her on hydroxychloroquine. No further disease modifying agents are being considered for rheumatoid arthritis. In terms of her interstitial lung disease and bronchiectasis she's on conservative therapy along with Activella device. This is helping and she feels better. Today she came in walking with her walker. This the happiest I have f seen her. She's not lost any weight but [facility]. She tried to go to pulmonary rehabilitation but could not because of knee pain. She is up-to-date with pneumonia vaccine. She's had a flu shot this  season.    OV 03/18/2016  Chief Complaint  Patient presents with   Acute Visit    Pt states breathing is not that bad today. Pt states she has cogestion build up, no tightness, SOB w/o excertion. Pt states breathing has been better since last  visit except the mucus. No cough feels the need to cough but can not.      follow-up bronchiectasis and immunosppessive thrapy. Visit 3 month olow-up. Body mssindex is stll very high but she says she has lost weight. Overal well. Last few days increasd symptoms of mucus, cough and subjective unwellness. No fever or colored sputum. But she feells she needs antibiotic and prednisone  OV 06/23/2016  Chief Complaint  Patient presents with   Follow-up    Pt states her SOB has been doing well but she has been more fatigued. Pt has an occ prod cough with light yellow mucus and chest tightness when SOB - resolves with rest.    Follow-up bronchiectasis with interstitial lung disease on immunosuppressive therapy due to rheumatoid arthritis. Last visit 01/17/2016. Since then she's lost 5 pounds of weight according to history. . She is here with her daughter. No acute bronchitis symptoms. Overall stable. The main issues that she's having increased joint pain and she is only on plaquenil. No fever or colored sputum.   OV 02/16/2017  Chief Complaint  Patient presents with   Follow-up    Pt states breathing has been okay. Stated she had a URI x1 month ago which she developed while in rehab after hip fx. Occ. SOB and occ. chest tightness. Denies any cough at this time.    Follow-up bronchiectasis with interstitial lung disease on immunosuppressive therapy due to rheumatoid arthritis. Last visit was 6 months ago approximately. Since then she has continued to lose weight through dietary control. She feels better. However in the summer 2018 she fell down due to orthostasis and fractured her left hip. This wasn't complicated by MRSA infection according to her history.  She is now undergoing physical therapy. Overall she's feeling well. She is very proud of her weight loss. Dyspnea is improved. She missed [facility] appointment for ILD clinic but she will make this again. She uses oxygen with heavy exertion at physical therapy and at night.  OV 02/11/2018  Subjective:  Patient ID: [name], female , DOB: 06/09/1940 , age 80 y.o. , MRN: [id number] , ADDRESS: [place] [id number]   02/11/2018 -   Chief Complaint  Patient presents with   Follow-up    Pt states she has been doing okay since last visit. States she has had some pain in the mid region of her chest x3 weeks and has also been having problems with fatigue. States she has had some occ coughing. Her main complaints are the pain in her chest and the exhaustion.     HPI Hester F Huffstetler 81 y.o. -follow-up ILD with bronchiectasis in the setting of rheumatoid arthritis and obesity and physical deconditioning  It is almost a year since I last saw her.  Since then her daughter died in [place] with advanced sarcoma.  She is getting grief counseling from hospice.  She also has palliative care support at home.  She also in the last year has fallen and hurt her back.  She is on attending physical therapy which is helping her.  She says she is lost 30 pounds in the last 1 year.  Because of all this functional status is somewhat better but still she is extremely dyspneic.  In fact when she walked 185 feet x 3 laps on room air using a walker: She is only able to complete a lap and a half and stop because of extreme shortness of breath.  Her resting pulse ox  is 98%.  Final pulse ox was 98%.  And her heart rate went from 107/min to 140/min.  She has not yet had a flu shot.  Of note she is on prednisone 15 mg/day which has helped her arthritis.  She is asking if it is okay to go back down to 10 mg/day from a lung standpoint.  She is also on a different arthritis immunomodulator which is helped her  rheumatoid arthritis pain.  She gets this treatment from [facility] rheumatology.  Pulmonary ILD medications also being managed at [facility].      OV 02/10/2019  Subjective:  Patient ID: [name], female , DOB: Jan 01, 1941 , age 14 y.o. , MRN: [id number] , ADDRESS: [address] [place] [address] [name]. -follow-up ILD with bronchiectasis in the setting of rheumatoid arthritis and obesity and physical deconditioning  Problem List - copy and paste from Joshua B: 1.  Extensive bronchiectasis - cylindrical vs. Traction - has been present and not significantly changed from 2014 to 2017                       - has history of hospitalization for what sounds like pneumonia vs. Bronchiectasis flare as far back as 1999; underwent bronchoscopy which made her feel "like a new woman" in 1999 2.  Pulmonary fibrosis - basilar predominant reticular opacities that have not change from 2014 to 2017 3.  Rheumatoid arthritis: RF 959 ; CCP >300                       - has been treated with methotrexate, biologics, etc in the past - none in the past several years                       - currently on 10 mg prednisone daily 4.  Morbid obesity with deconditioning - has had several hospitalizations in recent months; very deconditioned at present time   02/10/2019 -   Chief Complaint  Patient presents with   Follow-up    Pt here for ILD follow up. Pt states her breathing is unchanged since last OV in 01/2018. Pt denies new symptoms. Pt denies CP/tightness and f/s/c.      HPI Judaea F Kindig 81 y.o. -returns for follow-up.  I personally not seen her in 1 year.  In the interim she is kept up with follow-up at [facility].  In January 2020 she suffered from left lower extremity DVT after having shortness of breath.  She was admitted at [facility] and started on Eliquis.  She still continues with Eliquis.  She tells me that Morrie Sheldon was  considered as the cause although she does have obesity and sedentary lifestyle and venous incompetency.  It appears this was her first DVT.  She has had a CT angiogram chest in 2005 that was negative for PE and a Doppler ultrasound in 2016 and 2018 that was negative for DVT.  She says a cardiologist here locally is managing her Eliquis and plans to stop it after several months.  In terms of her rheumatoid arthritis she is now under the care of [place] [facility].  They just having her on prednisone.  In July 2020 she also saw Dr. Jacinto Reap pulmonary at [facility].  Apparently a walk test could not be performed.  She says she has lost weight.  Her main complaint is exertional dyspnea.  She wants to do portable oxygen.  She is only on nighttime oxygen.  In the past she always gets dyspneic way before she even desaturates a little bit this is on account of her obesity and deconditioning.  Her son is here with with her today.  This the first time I am meeting him.  He tells me that her breathing at night is rough although not as bad as his dad sleep apnea.  Patient is not on CPAP.  He is also curious about qualifying oxygen with walking.  Walking desaturation test for an 85 feet x 3 laps: She basically walked only half a lap.  Resting pulse ox was 97% with a heart rate of 102.  Final pulse ox was 96% with a heart rate of 122 and she stopped because of dyspnea.  She had a most recent CT chest there is resulted below and it is stable.   IMPRESSION: CT Chest high resolution 1. Mid and lower lung zone predominant bronchiectasis, peribronchovascular ground-glass and scattered subpleural reticular densities, grossly stable from 11/29/2014. Findings are nonspecific and can be seen with nonspecific interstitial pneumonitis. Usual interstitial pneumonitis is considered less likely but respiratory motion and expiratory phase imaging make assessment difficult. 2. Coronary artery calcification.     Electronically  Signed   By: Lorin Picket M.D.   On: 11/24/2018 13:04  ROS - per HPI  Results for ANGELEEN, HORNEY" (MRN [id number]) as of 02/10/2019 14:47  Ref. Range 04/17/2015 18:02 04/18/2015 13:30  D-Dimer, America Brown Latest Ref Range: 0.00 - 0.50 ug/mL-FEU 2.57 (H) 2.54 (H)   OV 06/21/2019  Subjective:  Patient ID: [name], female , DOB: 1940-06-05 , age 49 y.o. , MRN: [id number] , ADDRESS: [place] [id number]   06/21/2019 -   Chief Complaint  Patient presents with   Follow-up    Pt states she was recently admitted to hospital at [facility] for 10 days. Since then, pt has been  having some low O2 sats and issues with SOB. Pt states she is better now than she was.   Briseyda F Lippe. -follow-up ILD with bronchiectasis in the setting of rheumatoid arthritis and obesity and physical deconditioning  Problem List - copy and paste from [place] B: 1.  Extensive bronchiectasis - cylindrical vs. Traction - has been present and not significantly changed from 2014 to 2017                       - has history of hospitalization for what sounds like pneumonia vs. Bronchiectasis flare as far back as 1999; underwent bronchoscopy which made her feel "like a new woman" in 1999 2.  Pulmonary fibrosis - basilar predominant reticular opacities that have not change from 2014 to 2017 and in dec 2020 CT chest 3.  Rheumatoid arthritis: RF 959 ; CCP >300                       - has been treated with methotrexate, biologics, etc in the past - none in the past several years                       - currently on 10 mg prednisone daily 4.  Morbid obesity with deconditioning - has had several hospitalizations in recent months; very deconditioned at present time   HPI Fatina F Fryberger 81 y.o. -admitted approximately 2 months ago early  December 2020 with Klebsiella pansensitive urinary tract infection and bilateral lower extremity cellulitis.  Discharge on 2 L nasal  cannula.  Recommendations for home physical therapy were made.  Bilateral Doppler extremities were negative in the hospital.  Her CODE STATUS is full.  She returns for routine follow-up.  Her daughter is with her.  She continues to lose weight.  She has lost around 40 to 50 pounds.  She says she is on the medication for rheumatoid arthritis I do not know which one.  She says her cellulitis was quite bad and she is suffered from it.  But nevertheless with the weight loss her hypoxemia and overall joint pain is better.  She still has significant pedal edema and erythema in her lower extremities.  She is asking for some advice with that.  Otherwise no issues.  Using 2 L nasal cannula at night.  Walks around with a walker.  Symptom score is listed b       11/22/2020- Interim hx  with NP Patient presents today for 2 week follow-up PNA. She had a CXR that showed slight increase in opacity right lung base, questionable infiltrate. Treated with Augmentin 1 tab BID x 10 days. She is feeling a great deal better. She no longer has right sided pain. She still has some wheezing and cough with yellow mucus. She is taking muciex 1,29m twice a day. She is using her flutter valve 3-4 times a day. She has been using nebulizer twice a day. She takes 176mprednisone daily. She is on Toprol XL 7546mshe has not taken this yet today. She takes Azithromycin MWF pulmonary with [facility]. She can resume this. Needs repeat CXR today.  TEST/EVENTS : High-resolution CT chest November 24, 2018 mid to lower lung bronchiectasis, groundglass reticular densities stable since 2016  [facility]\  Interval History:  Has been doing ok until about a month ago.  Had pneumonia on the right side a month ago. Even now, sometimes she feels that she has pain in her R chest - upper front chest. Even sometimes today, it feels kind of bad.   She was doing a lot of mucus production. They she started hurting badly on the right side.  Thought she was sleeping too much. Did not pay it any mind. Went to see the doctor and she told them about the pain. They did an xray and diagnosed with pneumonia. She did not have any fever or chills - but she says that she very seldom in her life has had temp >99. She took antibiotics. Follow up xray revealed clearing. She was put on prednisone 40 mg with taper. She still gets kind of hoarse and has mucus in her upper throat. She is scheduled now for surgery for cataracts on August 9th.   She has started getting joint pain back in her knees again.   She was out of azithromycin. [facility] [facility]. Needs a 90 day supply.   Using Acapella. She has been using her albuterol nebulizer at home. She still has a lot of wheezing. This wheezing sometimes clears with coughing mucus, sometimes does not. Unsure if this gets better with the nebulizer. Happens more when she is laying down.   At the local pulmonologist, she needed oxygen and now has oxygen at home. Using LinLydiahe is using 2L with (sometimes 3L). This is with laying down. When walking and moving, she  keeps it on 2L.   Mucus production is getting better. She is using the acapella three or four times per day. She is using the nebulizer twice per day.   Has R chest discomfort which feels a bit worse today than prior. She says that it feels like a strain and it affects her voice. When she talks, it is like it is pulling or straining something.   Has help now every day at home. Son has been on her case about that.   Is on 10 mg prednisone - and has been on this dose for about a week.   Outside echo 06/04/2018: Study Conclusions   - Left ventricle: The cavity size was normal. There was mild focal    basal hypertrophy of the septum. Systolic function was normal.    The estimated ejection fraction was in the range of 60% to 65%.    Wall motion was normal; there were no regional wall motion     abnormalities.  - Right ventricle: The cavity size was mildly dilated. Wall    thickness was normal.  - Pulmonary arteries: Systolic pressure was mildly increased.   Echo pharm stress test with contrast 12/15/2018:  NORMAL STRESS TEST. NORMAL RESTING STUDY WITH NO WALL MOTION ABNORMALITIES AT REST AND PEAK STRESS. NO DOPPLER PERFORMED FOR VALVULAR REGURGITATION NO DOPPLER PERFORMED FOR VALVULAR STENOSIS NORMAL RESTING BP - APPROPRIATE RESPONSE  Cath done on 10/31/14:  Mild CAD without significant obstructive disease. Ectasia in all three  major proximal vessels.  Mild pulmonary hypertension. Aortic saturation 87%. Pulmonary artery  saturation 60%. Cardiac output 7.6 L. Cardiac index 3.3.  Continue medical therapy for lung disease. Continue preventative therapy  for her heart disease.Addendum by [name], MD on 10/31/2014 12:01 PM  Mild CAD without significant obstructive disease. Ectasia in all three  major proximal vessels.  Mild pulmonary hypertension. Aortic saturation 87%. Pulmonary artery  saturation 60%. Cardiac output 7.6 L. Cardiac index 3.3.  Current Functional Status:  Oxygen: uses 2-3L of oxygen at home PRN and with sleep.  Level of exertion that leads to dyspnea: Walking any distance makes her dyspneic.   Rehab: using the walker most of the time. When she goes out, she uses a wheelchair. Home PT was helpful. She had a problem getting in and out of vehicle and bed.  Laboratory Data/Test Results: Pulmonary Function Test (PFT) Latest Ref Rng & Units 07/02/2015 12/14/2017  FVC PRE L 1.78 1.58  FVC % PRE PRED % 63 57  FEV1 PRE L 1.49 1.33  FEV1 % PRE PRED % 70 65  FEV1/FVC PRE % 83.74 84.24  FEF25-75% PRE L/s 2.83 2.39  FEF25-75% % PRE PRED % 154 141  DLCO PRE ml/(min*mmHg) 11.28 11.41  DLCO % PRE PRED % 65 67   No PFTs today.  PFTs 12/14/17: FVC 1.58, 57 % predicted. FEV1 1.33, 65% predicted. FEV1/FVC 84.24. DLCO 11.41, 67% predicted. Compared to  06/2015, FVC is down 11.2%. DLCO is essentially stable.   Echocardiogram: 11/21/2015 - EF >55% with normal wall motion. Mildly enlarged RV with normal free wall. Mildly enlarged RA. Normal respiratory collapse of IVC. Peak RV pressure 41 mmHg.   EOTT today: Room air Spo2 96% at rest. HR at start of exercise was 122 / max HR was 150 during exercise = 106% predicted. Lowest SpO2 95% on room air. Walked 258 meters = 846 feet = 81% predicted. Total walk time = 5 minutes. Borg dyspnea scale = 7. Test ended early  because of dyspnea, fatigue, and chest tightness.   Labs:  None today  Impression: Patient is a 81 y.o. with PMH significant for rheumatoid arthritis, bronchiectasis (with flare vs. Pneumonia in late 2016), pulmonary fibrosis (not progressed per CT from 2014 to 06/2015), morbid obesity, and deconditioning who comes to pulmonary clinic for follow up of lung disease and dyspnea. She has been maintained on an aggressive bronchiectasis regimen (Acapella, nebulizer, TIW azithromycin) and she has done well. Unfortunately, she ran out of azithromycin, and in the setting of this, she developed increased cough and sputum and was treated for pneumonia.   Today, she feels like she is recovered from the PNA, but she is not all the way back to her baseline. She has some congestion in her throat and she is concerned about pain in her chest (which is reproducible with palpation over the sternum.  She has limited mobility at home. She uses furniture / counters to get around if she is not using her walker. She does some physical therapy.   Plan - from patient instructions:  I have renewed your azithromycin - you have a year supply that will be sent to [facility] in [place], [place] [facility]). You should take this every Monday, Wednesday, Friday.   I am also going to write a prescription for a 1 week supply of DAILY azithromycin. So take it daily for 7 days, then switch to every Monday, Wednesday, Friday. The  prescription is written so that this can all be under a single prescription.   Continue to use your Acapella at least 3-4 times per day.   We are going to get another chest x ray today.   Regarding your nebulizer, we will renew albuterol order from [facility].   Plan to take your nebulizer three times per day (along with Acapella use). Once your congestion is improved, you may back off to twice per day.   Try to use your voltaren gel on your chest (nearly your sternum where it is sore) to see if this helps with your pain.   Return in about 6 months (around 06/12/2021).     OV 05/23/2021  Subjective:  Patient ID: [name], female , DOB: 18-Aug-1940 , age 70 y.o. , MRN: [id number] , ADDRESS: [place] [id number] PCP [name], MD Patient Care Team: [name], MD as PCP - General (Family Medicine) Lorretta Harp, MD as PCP - Cardiology (Cardiology) Duffy, Creola Corn, LCSW as Social Worker (Licensed Clinical Social Worker) Kidney, Nelida Meuse, Abel Presto, MD as Consulting Physician (Rheumatology) Efraim Kaufmann, NP (Rheumatology) Theodosia Blender, MD as Referring Physician (Pulmonary Disease)  This Provider for this visit: Treatment Team:  Attending Provider: Brand Males, MD    05/23/2021 -   Chief Complaint  Patient presents with   Follow-up    Pt states since last OV, she has been in and out of the hospital. States she also ended up with Covid and has had problems with a lot of coughing and phlegm build-up. Pt's O2 sats had been dropping and had to bump O2 up to 3L    Mickie F Klausing. -follow-up ILD with bronchiectasis in the setting of rheumatoid arthritis and obesity and physical deconditioning  Problem List - copy and paste from [place] B: 1.  Extensive bronchiectasis - cylindrical vs. Traction - has been present and not significantly changed from 2014 to 2017                       -  has history of  hospitalization for what sounds like pneumonia vs. Bronchiectasis flare as far back as 1999; underwent bronchoscopy which made her feel "like a new woman" in 1999 2.  Pulmonary fibrosis - basilar predominant reticular opacities that have not change from 2014 to 2017 and in dec 2020 CT chest 3.  Rheumatoid arthritis: RF 959 ; CCP >300                       - has been treated with methotrexate, biologics, etc in the past - none in the past several years                       - currently on 10 mg prednisone daily 4.  Morbid obesity with deconditioning - has had several hospitalizations in recent months; very deconditioned at present time  - intentl wt loss +  HPI Tykeisha F Pounders 80 y.o. -returns for follow-up.  I personally not seen her in 2 years almost.  In between she is followed up with nurse practitioner and also [facility].  She is here with her daughter to [place] who I am meeting for the first time.  They tell me that she has had 2 admissions for dehydration in 2022.  After that she went to [place] [facility] where in November 2022 she ended up with COVID and was given antiviral.  They were unhappy with the admission to [facility] rehab.  They do not want to go there again.  Apparently after that she is now needing 3 L of oxygen while previously she is only needing 2 L of nasal cannula oxygen.  Last CT scan of the chest echocardiogram 2020 including review of the medical records at [facility] pulmonary function test with [place] was 2015 while at [facility] it was in 2019.  She continues to have functional disability.  She uses a walker to get around and change her clothes.  She continues to be immunosuppressed with prednisone, Plaquenil and also leflunomide for rheumatoid arthritis.  She has chronic pain and is on opioids \CT Chest data  No results found.   OV 07/12/2021  Subjective:  Patient ID: [name], female , DOB: 1940-11-07 , age 44 y.o. , MRN:  [id number] , ADDRESS: [place] [id number] PCP [name], MD Patient Care Team: [name], MD as PCP - General (Family Medicine) Lorretta Harp, MD as PCP - Cardiology (Cardiology) Duffy, Creola Corn, LCSW as Social Worker (Licensed Clinical Social Worker) Kidney, Nelida Meuse, Abel Presto, MD as Consulting Physician (Rheumatology) Efraim Kaufmann, NP (Rheumatology) Theodosia Blender, MD as Referring Physician (Pulmonary Disease)  This Provider for this visit: Treatment Team:  Attending Provider: Brand Males, MD    07/12/2021 -   Chief Complaint  Patient presents with   Follow-up    PFT performed today.   Pt states her breathing is about the same since last visit. States she is no longer coughing up as much mucus compared to last visit.    Problem List - copy and paste from [place] B: 1.  Extensive bronchiectasis - cylindrical vs. Traction - has been present and not significantly changed from 2014 to 2017                       - has history of hospitalization for what sounds like pneumonia vs. Bronchiectasis flare as far back  as 1999; underwent bronchoscopy which made her feel "like a new woman" in 1999 2.  Pulmonary fibrosis - basilar predominant reticular opacities that have not change from 2014 to 2017 and in dec 2020 CT chest 3.  Rheumatoid arthritis: RF 959 ; CCP >300                       - has been treated with methotrexate, biologics, etc in the past - none in the past several years                       - currently on 10 mg prednisone daily 4.  Morbid obesity with deconditioning - has had several hospitalizations in recent months; very deconditioned at present time  - intentl wt loss +  5. COvid - Nov 2022 associated with hospitalizations for dehydration   HPI Willetta F Strohman 80 y.o. -returns for follow-up.  She presents with her daughter [name].  She is here to review results.  She does say that after her  COVID in 07 April 2021 her dyspnea is worse and then also oxygen uses up to 3 L.  Compared to the last visit she is stable but definitely compared to a few years ago she is more short of breath and requiring more oxygen.  She also reports that since November 2022 her right fifth finger has more lateral deviation.  In her fourth finger seems to be in a fixed abnormality.  She did try a short course prednisone with her primary care physician and it does not help.  She has rheumatology appointment pending with Dr. Clovis Riley at [facility] next month.  Her test results show that her echo is essentially normal.  No comment about pulmonary hypertension.  Her pulmonary function test shows FVC declined compared to 2019 at [facility] and also 2015 with [place].  This correlates with increased shortness of breath over the last few years increased subjective oxygen need.  She did have a high-resolution CT scan of the chest that our radiologist feels there is progression and she has UIP pattern with emerging honeycombing.  I personally visualized various CT scans of the chest.  I am not so sure about progression but I did tell her that I would put this up for multidisciplinary case conference.  We discussed care for progressive interstitial lung disease in the setting of rheumatoid arthritis.  Did discuss about registry protocol and I gave her the consent form.  She is interested in this.  Also discussed about nintedanib antifibrotic.  Did discuss about diarrhea as a side effect and GI issues.  She says she is constipated she and her daughter feel with polypharmacy that she should easily be able to handle this 1 other medication.  They are interested in this.  They are willing to wait till multidisciplinary case conference is done.  We also discussed the fact with WHO group 3 pulmonary hypertension inhaled treprostinil as an option.  She is never had a right heart cath.  I have written to Dr. Quay Burow her  cardiologist to consider right heart catheterization.  We will regroup in a couple of months.    MARCH 2023 ILD condrerence with Dr Burt Ek  "[name] Dea Bitting DOB: [birth year] MRN:  [id number] Female, 81 y.o." [name] "   RA with ILD . Stable for years but then covid nov 2022 Rx as outpaitent. Since then o2 needs 2 -> 3L /PFT - slightly worse  FVC ? technique issue becase could not do dlco. CT reported as UIP and worse but MR not sure.   Question: is CT really worse? If so, she qualifies for registry study and ofev. "    "HRCT: 06/19/2021 HRCT: 11/24/2018 HRCT: 11/29/2014 HRCT: 07/20/2014 HRCT: 10/05/2013 HRCT: 04/08/2013" No lung biopsy    Concordant Between MDD and official read  On laest scan there is ILD and Tracheobroncholamacia and also diffuse Traction bronchiectasis without ILD. In LL there is bialteral LL HC., subpleural disease UIP that is unusual - suggestive of CTD.     Can approach as ILD     xxxxxxxxxxxxxxxx                   [facility] 08/12/21  HEMODYNAMICS:    1: Right atrial pressure-20/18 2: Right ventricular pressure-37/17, mean 22 3: Pulmonary artery pressure-43/19, mean 30 4: Pulmonary wedge pressure-A-wave 23, V wave 20, mean 22 - does not meet tyvaso criteria 5: Cardiac output-4.97 L/min with an index of 2.43 L/min/m 6: PVR-1.81 -0 does not meet tyvaso cirtiera     IMPRESSION: Ms. Axon has moderately elevated right atrial pressures and mildly elevated pulmonary artery pressures.  Her PA saturations were 58%.  The sheath was removed and a bandage was placed over the right antecubital puncture site.  The patient left the lab in stable condition.  She will be discharged home later this morning and to follow-up with [name] and myself.   Quay Burow. MD, [facility] 08/12/2021 8:06   OV 09/13/2021  Subjective:  Patient ID: [name], female , DOB: 20-Nov-1940 , age 57 y.o. , MRN: [id number] , ADDRESS: [place]  [id number] PCP [name], MD Patient Care Team: [name], MD as PCP - General (Family Medicine) Lorretta Harp, MD as PCP - Cardiology (Cardiology) Duffy, Creola Corn, LCSW as Social Worker (Licensed Clinical Social Worker) Kidney, Nelida Meuse, Abel Presto, MD as Consulting Physician (Rheumatology) Efraim Kaufmann, NP (Rheumatology) Theodosia Blender, MD as Referring Physician (Pulmonary Disease)  This Provider for this visit: Treatment Team:  Attending Provider: Brand Males, MD    09/13/2021 -   Chief Complaint  Patient presents with   Follow-up    Pt states she has been doing okay since last visit. States her breathing is about the same.   Problem List - copy and paste from [place] B: 1.  Extensive bronchiectasis - cylindrical vs. Traction - has been present and not significantly changed from 2014 to 2017                       - has history of hospitalization for what sounds like pneumonia vs. Bronchiectasis flare as far back as 1999; underwent bronchoscopy which made her feel "like a new woman" in 1999 2.  Pulmonary fibrosis - basilar predominant reticular opacities that have not change from 2014 to 2017 and in dec 2020 CT chest 3.  Rheumatoid arthritis: RF 959 ; CCP >300                       - has been treated with methotrexate, biologics, etc in the past - none in the past several years                       - currently on 10 mg prednisone daily 4.  Morbid obesity with deconditioning - has had several hospitalizations  in recent months; very deconditioned at present time  - intentl wt loss +  5. COvid - Nov 2022 associated with hospitalizations for dehydratio  HPI Fabiana F Fleites 81 y.o. -returns for follow-up.  After the last visit we discussed the multidisciplinary case conference.  Independent radiologist concurred there was UIP and she has had slow progression.  She had right heart catheterization and there is no pulmonary  hypertension to qualify her for inhaled treprostinil based on WHO group 3.  Overall she is better from a RA perspective.  She is doing physical therapy.  No new complaints.  She is still following a strict diet she is lost some more weight.  We discussed nintedanib as an antifibrotic.  She wants to do this.  She has constipation.  Did discuss that sometimes she could have diverticulitis and abdominal . Which is very rare but have seen 1 or 2 patients in the setting of opioids do this especially with nintedanib.  She is accepting of the risk.  Discussed rare possibilities of bleeding and MI.  She is understanding.  She wants to start the medication.    OV 02/04/2022  Subjective:  Patient ID: [name], female , DOB: 02-24-1941 , age 89 y.o. , MRN: [id number] , ADDRESS: [place] [id number] PCP [name], MD Patient Care Team: [name], MD as PCP - General (Family Medicine) Lorretta Harp, MD as PCP - Cardiology (Cardiology) Duffy, Creola Corn, LCSW as Social Worker (Licensed Clinical Social Worker) [place] Bo Merino, MD as Consulting Physician (Rheumatology) Efraim Kaufmann, NP (Rheumatology) Theodosia Blender, MD as Referring Physician (Pulmonary Disease)  This Provider for this visit: Treatment Team:  Attending Provider: Brand Males, MD    02/04/2022 -   Chief Complaint  Patient presents with   Follow-up    Follow up after PFT. Pt fell on Sunday and she fell a week before that as well.     Problem List - copy and paste from [place] B: 1.  Extensive bronchiectasis - cylindrical vs. Traction - has been present and not significantly changed from 2014 to 2017                       - has history of hospitalization for what sounds like pneumonia vs. Bronchiectasis flare as far back as 1999; underwent bronchoscopy which made her feel "like a new woman" in 1999 2.  Pulmonary fibrosis  - basilar predominant reticular opacities that have not change from 2014 to 2017 and in dec 2020 CT chest 3.  Rheumatoid arthritis: RF 959 ; CCP >300                       - has been treated with methotrexate, biologics, etc in the past - none in the past several years                       - currently on 10 mg prednisone daily 3b.  Normal right heart catheterization March 2023 4.  Morbid obesity with deconditioning - has had several hospitalizations in recent months; very deconditioned at present time  - intentl wt loss +  5. COvid - Nov 2022 associated with hospitalizations for dehydratin  HPI Denys F Schiro 81 y.o. -returns for follow-up.  She says that few days ago she had slurred speech noticed by the family members well on the  telephone and later when she went to the bathroom she fell patient sustained some injuries.  She went to ER all the imaging work-up as listed below.  Everything was reassuring.  The suspecting a TIA and she has been referred to a neurologist.  Her blood work 02/02/2022 was fairly unremarkable.  She has been on nintedanib since I last saw her she is only in the low-dose 100 mg twice daily because of her polypharmacy and overall poor functional status but even with that she is having significant diarrhea and fatigue.  All her symptom scores are worse.  The son also feels that her overall general wellbeing is decline in the last few weeks.  She definitely attributes the diarrhea to the nintedanib.  She is not sure about the fatigue but she believes it could be from the nintedanib.  There is no GI discomfort other than diarrhea.    SYMPTOM SCALE - ILD 06/21/2019 Birth weight not on file Last Weight  Most recent update: 06/21/2019 11:53 AM    Weight  113.3 kg (249 lb 12.8 oz)             05/23/2021 224# (used to 300# some yearsaago) 07/12/2021 224# 02/04/2022 Ofev   O2 use 2L nithg 3L 3L   Shortness of Breath 0 -> 5 scale with 5 being worst (score 6 If unable to do)      At rest _0 Simple tasks - showers, clothes change, eating, shaving _1 Household (dishes, doing bed, laundry) _2 Shopping _3 Walking level at own pace _4 Walking up Stairs 6  x 0  Total (30-36) Dyspnea Score _5 How bad is your cough? _6 How bad is your fatigue 0 0 4 5  How bad is nausea 0 0 0 0  How bad is vomiting?  0 0 0 0  How bad is diarrhea? 0 0 0 5  How bad is anxiety? _7 How bad is depression _8 CT Chest data - HRT FEb 2023  Narrative & Impression  CLINICAL DATA:  81 year old female with history of chest tightness and shortness of breath. Prior history of COVID infection in December 2022.   EXAM: CT CHEST WITHOUT CONTRAST   TECHNIQUE: Multidetector CT imaging of the chest was performed following the standard protocol without intravenous contrast. High resolution imaging of the lungs, as well as inspiratory and expiratory imaging, was performed.   RADIATION DOSE REDUCTION: This exam was performed according to the departmental dose-optimization program which includes automated exposure control, adjustment of the mA and/or kV according to patient size and/or use of iterative reconstruction technique.   COMPARISON:  Chest CT 01/16/2021.   FINDINGS: Cardiovascular: Heart size is normal. There is no significant pericardial fluid, thickening or pericardial calcification. There is aortic atherosclerosis, as well as atherosclerosis of the great vessels of the mediastinum and the coronary arteries, including calcified atherosclerotic plaque in the left anterior descending coronary artery.   Mediastinum/Nodes: No pathologically enlarged mediastinal or hilar lymph nodes. Esophagus is unremarkable in appearance. No axillary lymphadenopathy.   Lungs/Pleura: High-resolution images demonstrate some patchy areas of ground-glass attenuation, septal thickening, subpleural reticulation, mild cylindrical traction  bronchiectasis, peripheral bronchiolectasis and a few areas of apparent developing honeycombing. These findings have a definitive craniocaudal gradient and appear minimally progressive compared to  the recent prior examination. Inspiratory and expiratory imaging demonstrates some mild air trapping indicative of mild small airways disease. No acute consolidative airspace disease. No pleural effusions. No definite suspicious appearing pulmonary nodules or masses are noted.   Upper Abdomen: Unremarkable.   Musculoskeletal: There are no aggressive appearing lytic or blastic lesions noted in the visualized portions of the skeleton.   IMPRESSION: 1. The appearance of the lungs is considered diagnostic of usual interstitial pneumonia (UIP) per current ATS guidelines, demonstrating mild progression compared to prior examinations. 2. Aortic atherosclerosis, in addition to left anterior descending coronary artery disease. Please note that although the presence of coronary artery calcium documents the presence of coronary artery disease, the severity of this disease and any potential stenosis cannot be assessed on this non-gated CT examination. Assessment for potential risk factor modification, dietary therapy or pharmacologic therapy may be warranted, if clinically indicated.   Aortic Atherosclerosis (ICD10-I70.0).     Electronically Signed   By: Vinnie Langton M.D.   On: 06/21/2021 07:48    OV 03/31/2022  Subjective:  Patient ID: [name], female , DOB: 03/05/1941 , age 81 y.o. , MRN: [id number] , ADDRESS: [place] [id number] PCP Melvenia Beam, MD Patient Care Team: Melvenia Beam, MD as PCP - General (Neurology) Lorretta Harp, MD as PCP - Cardiology (Cardiology) Duffy, Creola Corn, LCSW as Social Worker (Licensed Clinical Social Worker) [place], [place] Bo Merino, MD as Consulting Physician (Rheumatology) Efraim Kaufmann, NP (Rheumatology) Theodosia Blender, MD as Referring Physician (Pulmonary Disease)  This Provider for this visit: Treatment Team:  Attending Provider: Brand Males, MD    03/31/2022 -  No chief complaint on file.    HPI Jacy F Min 81 y.o. -    CT Chest data  No results found.    PFT     Latest Ref Rng & Units 02/04/2022    3:06 PM 07/12/2021    2:29 PM 01/12/2014   12:59 PM 09/12/2013   10:02 AM 04/28/2013   10:07 AM  PFT Results  FVC-Pre L 1.47  1.29  1.59  1.78  1.73   FVC-Predicted Pre % 62  70  76  77  74   FVC-Post L   1.46  1.54  1.19   FVC-Predicted Post %   70  66  51   Pre FEV1/FVC % % 91  92  91  91  84   Post FEV1/FCV % %   93  89  97   FEV1-Pre L 1.34  1.18  1.45  1.63  1.46   FEV1-Predicted Pre % 76  83  90  90  81   FEV1-Post L   1.36  1.36  1.15   DLCO uncorrected ml/min/mmHg 11.92  -0.03  16.69  12.50    DLCO UNC% % 67  0  77  51    DLCO corrected ml/min/mmHg 11.89  -0.03      DLCO COR %Predicted % 67  0      DLVA Predicted % 95  29  95  91    TLC L   3.55  3.57  2.94   TLC % Predicted %   74  70  58   RV % Predicted %   62  66  62        has a past medical history of Acute on chronic diastolic (congestive) heart failure (HCC), Anemia, Anxiety, Arthritis, Asthma, Carotid artery occlusion,  Chronic fatigue, Chronic kidney disease, Closed left hip fracture (HCC), Clotting disorder ([place]), Contusion of left knee, COPD (chronic obstructive pulmonary disease) ([place]), Depression, reactive, Diabetes mellitus, Diastolic dysfunction, Difficulty in walking, Family history of heart disease, Generalized muscle weakness, Gout, High cholesterol, History of falling, Hypertension, Hypothyroidism, Interstitial lung disease ([place]), 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   reports that she has never smoked. She has never used smokeless tobacco.  Past Surgical History:  Procedure Laterality Date   BRAIN SURGERY     Gamma knife 10/13. Needs repeat spring  '14   CARDIAC CATHETERIZATION N/A 10/31/2014   Procedure: Right/Left Heart Cath and Coronary Angiography;  Surgeon: [name], MD;  Location: [facility];  Service: Cardiovascular;  Laterality: N/A;   CYST EXCISION     2 on face   CYST REMOVAL NECK     ESOPHAGOGASTRODUODENOSCOPY (EGD) WITH PROPOFOL N/A 09/14/2014   Procedure: ESOPHAGOGASTRODUODENOSCOPY (EGD) WITH PROPOFOL;  Surgeon: [name], MD;  [facility];  Service: Endoscopy;  Laterality: N/A;   INCISION AND DRAINAGE HIP Left 01/16/2017   Procedure: IRRIGATION AND DEBRIDEMENT LEFT HIP;  Surgeon: [name], MD;  [facility];  Service: Orthopedics;  Laterality: Left;   INTRAMEDULLARY (IM) NAIL INTERTROCHANTERIC Left 11/29/2016   Procedure: INTRAMEDULLARY (IM) NAIL INTERTROCHANTRIC;  Surgeon: [name], MD;  Location: [facility];  Service: Orthopedics;  Laterality: Left;   OVARY SURGERY     RIGHT HEART CATH N/A 08/12/2021   Procedure: RIGHT HEART CATH;  Surgeon: Lorretta Harp, MD;  Location: [facility];  Service: Cardiovascular;  Laterality: N/A;   SHOULDER SURGERY Left    TONSILLECTOMY  age 19   VAGINAL HYSTERECTOMY     VIDEO BRONCHOSCOPY Bilateral 05/31/2013   Procedure: VIDEO BRONCHOSCOPY WITHOUT FLUORO;  Surgeon: Brand Males, MD;  Location: [facility];  Service: Cardiopulmonary;  Laterality: Bilateral;   video bronscoscopy  05/19/1998   lung    Allergies  Allergen Reactions   Codeine Swelling and Other (See Comments)    Facial swelling Chest pain and swelling in legs     Infliximab Anaphylaxis    ([place]) "sent me into shock"    Lisinopril Swelling     Face and neck swelling     Immunization History  Administered Date(s) Administered   Fluad Quad(high Dose 65+) 02/02/2019, 03/11/2021   Influenza Split 01/29/2012, 03/30/2013, 02/10/2014   Influenza, High Dose Seasonal PF 02/16/2017, 02/11/2018, 01/31/2019, 02/27/2020   Influenza,inj,Quad PF,6+ Mos 02/09/2015, 12/18/2015   Influenza-Unspecified 02/09/2015, 12/18/2015   PFIZER Comirnaty(Gray Top)Covid-19 Tri-Sucrose Vaccine 07/24/2019, 08/14/2019, 02/27/2020   PFIZER(Purple Top)SARS-COV-2 Vaccination 07/17/2019, 08/14/2019   PPD Test 04/23/2015   Pneumococcal Conjugate-13 08/21/2014   Pneumococcal Polysaccharide-23 02/17/2012   Tdap 12/27/2015    Family History  Problem Relation Age of Onset   Diabetes Mother    Heart attack Mother    Hypertension Father    Lung cancer Father    Diabetes Sister    Breast cancer Sister    Breast cancer Sister    Diabetes Brother    Hypertension Brother    Heart disease Brother    Heart attack Brother    Kidney cancer Brother    Gout Brother    Kidney failure Brother  x 5   Esophageal cancer Brother    Stomach cancer Brother    Prostate cancer Brother    Uterine cancer Daughter        with mets   Fibroids Daughter    Hypertension Daughter    Hypertension Daughter    Hypertension Son    Heart murmur Son    Rheum arthritis Maternal Uncle    Alzheimer's disease Neg Hx    Dementia Neg Hx      Current Outpatient Medications:    acetaminophen (TYLENOL) 500 MG tablet, Take 500-1,000 mg by mouth every 6 (six) hours as needed for moderate pain., Disp: , Rfl:    albuterol (PROVENTIL) (2.5 MG/3ML) 0.083% nebulizer solution, Take 3 mLs (2.5 mg total) by nebulization every 6 (six) hours as needed for wheezing., Disp: 75 mL, Rfl: 0   allopurinol (ZYLOPRIM) 300 MG tablet, Take 1 tablet (300 mg total) by mouth daily., Disp: 30 tablet, Rfl: 0   amLODipine (NORVASC) 10 MG tablet, Take 10 mg by mouth daily., Disp: , Rfl:    apixaban (ELIQUIS)  5 MG TABS tablet, Take 1 tablet (5 mg total) by mouth 2 (two) times daily., Disp: 60 tablet, Rfl: 5   Artificial Tear Ointment (DRY EYES OP), Place 1 drop into both eyes 2 (two) times daily as needed (dry eyes)., Disp: , Rfl:    aspirin 81 MG chewable tablet, Chew 1 tablet by mouth daily., Disp: , Rfl:    atorvastatin (LIPITOR) 20 MG tablet, Take 0.5 tablets (10 mg total) by mouth every evening. (Patient taking differently: Take 20 mg by mouth every evening.), Disp: 15 tablet, Rfl: 0   azithromycin (ZITHROMAX) 250 MG tablet, Take 250 mg by mouth 3 (three) times a week., Disp: , Rfl:    Calcium Carb-Cholecalciferol (CALCIUM + D3 PO), Take 2 tablets by mouth daily with lunch. , Disp: , Rfl:    Cholecalciferol (VITAMIN D-3) 25 MCG (1000 UT) CAPS, Take 2 capsules (2,000 Units total) by mouth daily. GIVE ONE TAB PO Q DAY, Disp: 60 capsule, Rfl: 0   colchicine 0.6 MG tablet, Take 1 tablet (0.6 mg total) by mouth daily as needed (gout flares)., Disp: 30 tablet, Rfl: 0   cyclobenzaprine (FLEXERIL) 5 MG tablet, Take 1 tablet (5 mg total) by mouth at bedtime as needed for muscle spasms., Disp: 90 tablet, Rfl: 1   diclofenac Sodium (VOLTAREN) 1 % GEL, Apply 2 g topically 3 (three) times daily. Apply to both hands and knees, Disp: 100 g, Rfl: 1   donepezil (ARICEPT) 5 MG tablet, Take 1 tablet (5 mg total) by mouth at bedtime., Disp: 90 tablet, Rfl: 3   Dulaglutide (TRULICITY) 3.55 HR/4.1UL SOPN, Inject 0.75 mg into the skin once a week., Disp: 6 mL, Rfl: 2   DULoxetine (CYMBALTA) 60 MG capsule, Take 1 capsule (60 mg total) by mouth daily., Disp: 90 capsule, Rfl: 1   fluticasone (FLONASE) 50 MCG/ACT nasal spray, Place 2 sprays into both nostrils daily., Disp: 16 g, Rfl: 3   furosemide (LASIX) 80 MG tablet, Take 1 tablet (80 mg total) by mouth daily., Disp: 30 tablet, Rfl: 11   gabapentin (NEURONTIN) 600 MG tablet, Take 2 tablets (1,200 mg total) by mouth 3 (three) times daily., Disp: 180 tablet, Rfl: 1    guaiFENesin (MUCINEX) 600 MG 12 hr tablet, Take 2 tablets (1,200 mg total) by mouth 2 (two) times daily., Disp: 120 tablet, Rfl: 0   hydrALAZINE (APRESOLINE) 25 MG tablet, Take 1 tablet (25 mg  total) by mouth 2 (two) times daily., Disp: 180 tablet, Rfl: 1   hydroxychloroquine (PLAQUENIL) 200 MG tablet, Take 1 tablet (200 mg total) by mouth daily., Disp: 30 tablet, Rfl: 0   leflunomide (ARAVA) 20 MG tablet, Take 1 tablet (20 mg total) by mouth daily., Disp: 30 tablet, Rfl: 0   levothyroxine (SYNTHROID) 50 MCG tablet, Take 1 tablet (50 mcg total) by mouth daily., Disp: 90 tablet, Rfl: 3   linaclotide (LINZESS) 72 MCG capsule, Take 1 capsule (72 mcg total) by mouth daily before breakfast., Disp: 30 capsule, Rfl: 3   metolazone (ZAROXOLYN) 2.5 MG tablet, TAKE 1 TABLET BY MOUTH DAILY AS NEEDED FOR 2 DAYS FOR WEIGHT ABOVE 255 POUNDS, Disp: 30 tablet, Rfl: 0   metoprolol succinate (TOPROL-XL) 100 MG 24 hr tablet, Take 1 tablet by mouth once daily along with 1 metoprolol 62m tablet, Disp: 90 tablet, Rfl: 1   metoprolol succinate (TOPROL-XL) 25 MG 24 hr tablet, Take 5 tablets (125 mg total) by mouth daily., Disp: 150 tablet, Rfl: 3   morphine (MS CONTIN) 15 MG 12 hr tablet, Take 1 tablet (15 mg total) by mouth every 12 (twelve) hours. Do Not Fill Before 04/10/2022, Disp: 60 tablet, Rfl: 0   morphine (MSIR) 15 MG tablet, Take 1 tablet (15 mg total) by mouth 2 (two) times daily., Disp: 60 tablet, Rfl: 0   Multiple Vitamin (MULTIVITAMIN WITH MINERALS) TABS, Take 1 tablet by mouth daily. , Disp: , Rfl:    Nintedanib (OFEV) 100 MG CAPS, Take 1 capsule (100 mg total) by mouth 2 (two) times daily., Disp: 180 capsule, Rfl: 1   olmesartan (BENICAR) 40 MG tablet, Take 1 tablet (40 mg total) by mouth daily., Disp: 90 tablet, Rfl: 3   ondansetron (ZOFRAN-ODT) 4 MG disintegrating tablet, Take 1 tablet (4 mg total) by mouth every 8 (eight) hours as needed for nausea or vomiting., Disp: 14 tablet, Rfl: 1   OXYGEN-HELIUM IN,  Inhale 3 L into the lungs at bedtime. And on exertion, Disp: , Rfl:    pantoprazole (PROTONIX) 40 MG tablet, take 1 tablet by mouth once daily MINUTES BEFORE 1ST MEAL OF THE DAY, Disp: 90 tablet, Rfl: 3   Potassium Chloride ER 20 MEQ TBCR, Take 20 mEq by mouth daily., Disp: 30 tablet, Rfl: 0   predniSONE (DELTASONE) 10 MG tablet, Take 1 tablet (10 mg total) by mouth daily with breakfast., Disp: 90 tablet, Rfl: 1   sodium chloride (OCEAN) 0.65 % SOLN nasal spray, Place 1 spray into both nostrils as needed for congestion., Disp: , Rfl:       Objective:   There were no vitals filed for this visit.  Estimated body mass index is 38.45 kg/m as calculated from the following:   Height as of 03/31/22: _0  (1.626 m).   Weight as of 03/31/22: 224 lb (101.6 kg).  _1 @  There were no vitals filed for this visit.   Physical Exam  General Appearance:    Alert, cooperative, no distress, appears stated age - *** , Deconditioned looking - *** , OBESE  - ***, Sitting on Wheelchair -  ***  Head:    Normocephalic, without obvious abnormality, atraumatic  Eyes:    PERRL, conjunctiva/corneas clear,  Ears:    Normal TM's and external ear canals, both ears  Nose:   Nares normal, septum midline, mucosa normal, no drainage    or sinus tenderness. OXYGEN ON  - *** . Patient is @ ***   Throat:  Lips, mucosa, and tongue normal; teeth and gums normal. Cyanosis on lips - ***  Neck:   Supple, symmetrical, trachea midline, no adenopathy;    thyroid:  no enlargement/tenderness/nodules; no carotid   bruit or JVD  Back:     Symmetric, no curvature, ROM normal, no CVA tenderness  Lungs:     Distress - *** , Wheeze ***, Barrell Chest - ***, Purse lip breathing - ***, Crackles - ***   Chest Wall:    No tenderness or deformity.    Heart:    Regular rate and rhythm, S1 and S2 normal, no rub   or gallop, Murmur - ***  Breast Exam:    NOT DONE  Abdomen:     Soft, non-tender, bowel sounds active all four  quadrants,    no masses, no organomegaly. Visceral obesity - ***  Genitalia:   NOT DONE  Rectal:   NOT DONE  Extremities:   Extremities - normal, Has Cane - ***, Clubbing - ***, Edema - ***  Pulses:   2+ and symmetric all extremities  Skin:   Stigmata of Connective Tissue Disease - ***  Lymph nodes:   Cervical, supraclavicular, and axillary nodes normal  Psychiatric:  Neurologic:   Pleasant - ***, Anxious - ***, Flat affect - ***  CAm-ICU - neg, Alert and Oriented x 3 - yes, Moves all 4s - yes, Speech - normal, Cognition - intact    General: No distress. *** Neuro: Alert and Oriented x 3. GCS 15. Speech normal Psych: Pleasant Resp:  Barrel Chest - ***.  Wheeze - ***, Crackles - ***, No overt respiratory distress CVS: Normal heart sounds. Murmurs - *** Ext: Stigmata of Connective Tissue Disease - *** HEENT: Normal upper airway. PEERL +. No post nasal drip        Assessment:     No diagnosis found.     Plan:     There are no Patient Instructions on file for this visit.    SIGNATURE    Dr. Brand Males, M.D., F.C.C.P,  Pulmonary and Critical Care Medicine Staff Physician, [place] Director - Interstitial Lung Disease  Program  [facility] [place], [place], [address]  Pager: [phone], If no answer or between  15:00h - 7:00h: call [phone] Telephone: [phone]  5:32 PM 03/31/2022

## 2022-03-31 NOTE — Patient Instructions (Signed)
ICD-10-CM   1. Interstitial lung disease due to connective tissue disease ([place])  J84.89    M35.9     2. History of rheumatoid arthritis  Z87.39        ILD slowly getting worse but stable sinc last visit in June 2023 . -> sept 2023 Severe diarrhea due to nintedanib even at the low-dose Additional fatigue could be because of the nintedanib    Plan  - Stop nintedanib; marked as allergy -Address  recent fall with neurologist -No antifibrotic till fall issues and injuries are sorted out -Take consent form for ILD-Pro's research study (will have someone from the [facility]   research team contact if you qualify  Followup  - 8 weeks with [name] for a face-to-face discussion and for consideration of pirfenidone

## 2022-03-31 NOTE — Progress Notes (Unsigned)
Subjective:    Patient ID: [name], female    DOB: [birth year], 81 y.o.   MRN: [id number]  HPI: [name] is a 81 y.o. female who returns for follow up appointment for chronic pain and medication refill. She states her pain is located in her neck radiating into her bilateral shoulders, lower back pain radiating into her bilateral hips and bilateral knee pain. She also reports generalized joint pain. She rates her pain 5. Her current exercise regime is walking  with her walker.   [name] reports she fell a month ago, she was walking to the bathroom and fell forward, her son states he believes her blood sugar dropped.   Her son also states she was sitting in a computer chair and loss her balance by sitting at the edge of chair. She was instructed not to use rollong chair, she verbalizes understanding. She and her son was educated on [employer], they verbalize understanding. Her daughter is dispensing her medication.   She went to Emergency room on 02/01/22 and 09/17/ 2023, note was reviewed.  She also went to emergency room on 10.14/2023 for abdominal pain. GI following and she is scheduled for Colonoscopy.   [name] Morphine equivalent is 60.00 MME.   Oral Swab was Ordered today.      Pain Inventory Average Pain 7 Pain Right Now 5 My pain is burning and aching  In the last 24 hours, has pain interfered with the following? General activity 7 Relation with others 6 Enjoyment of life 6 What TIME of day is your pain at its worst? morning  Sleep (in general) Fair  Pain is worse with: walking, bending, standing, and some activites Pain improves with: rest, heat/ice, pacing activities, and medication Relief from Meds: 7  Family History  Problem Relation Age of Onset   Diabetes Mother    Heart attack Mother    Hypertension Father    Lung cancer Father    Diabetes Sister    Breast cancer Sister    Breast cancer Sister    Diabetes Brother     Hypertension Brother    Heart disease Brother    Heart attack Brother    Kidney cancer Brother    Gout Brother    Kidney failure Brother        x 5   Esophageal cancer Brother    Stomach cancer Brother    Prostate cancer Brother    Uterine cancer Daughter        with mets   Fibroids Daughter    Hypertension Daughter    Hypertension Daughter    Hypertension Son    Heart murmur Son    Rheum arthritis Maternal Uncle    Alzheimer's disease Neg Hx    Dementia Neg Hx    Social History   Socioeconomic History   Marital status: Married    Spouse name: Not on file   Number of children: 6   Years of education: college   Highest education level: Not on file  Occupational History   Occupation: retired Marine scientist  Tobacco Use   Smoking status: Never   Smokeless tobacco: Never  Vaping Use   Vaping Use: Never used  Substance and Sexual Activity   Alcohol use: No   Drug use: No   Sexual activity: Not Currently  Other Topics Concern   Not on file  Social History Narrative   Patient consumes 2-3 cups coffee per day.   Social Determinants of Health  Financial Resource Strain: Low Risk  (10/23/2021)   Overall Financial Resource Strain (CARDIA)    Difficulty of Paying Living Expenses: Not hard at all  Food Insecurity: No Food Insecurity (10/23/2021)   Hunger Vital Sign    Worried About Running Out of Food in the Last Year: Never true    Ran Out of Food in the Last Year: Never true  Transportation Needs: No Transportation Needs (10/23/2021)   PRAPARE - Hydrologist (Medical): No    Lack of Transportation (Non-Medical): No  Physical Activity: Insufficiently Active (10/23/2021)   Exercise Vital Sign    Days of Exercise per Week: 2 days    Minutes of Exercise per Session: 40 min  Stress: No Stress Concern Present (10/18/2020)   [place]    Feeling of Stress : Not at all  Social Connections:  [place] (10/23/2021)   Social Connection and Isolation Panel [NHANES]    Frequency of Communication with Friends and Family: More than three times a week    Frequency of Social Gatherings with Friends and Family: More than three times a week    Attends Religious Services: More than 4 times per year    Active Member of [employer] or Organizations: Yes    Attends Archivist Meetings: More than 4 times per year    Marital Status: Married   Past Surgical History:  Procedure Laterality Date   BRAIN SURGERY     Gamma knife 10/13. Needs repeat spring  '14   CARDIAC CATHETERIZATION N/A 10/31/2014   Procedure: Right/Left Heart Cath and Coronary Angiography;  Surgeon: [name], MD;  Location: [facility];  Service: Cardiovascular;  Laterality: N/A;   CYST EXCISION     2 on face   CYST REMOVAL NECK     ESOPHAGOGASTRODUODENOSCOPY (EGD) WITH PROPOFOL N/A 09/14/2014   Procedure: ESOPHAGOGASTRODUODENOSCOPY (EGD) WITH PROPOFOL;  Surgeon: [name], MD;  [facility];  Service: Endoscopy;  Laterality: N/A;   INCISION AND DRAINAGE HIP Left 01/16/2017   Procedure: IRRIGATION AND DEBRIDEMENT LEFT HIP;  Surgeon: [name], MD;  [facility];  Service: Orthopedics;  Laterality: Left;   INTRAMEDULLARY (IM) NAIL INTERTROCHANTERIC Left 11/29/2016   Procedure: INTRAMEDULLARY (IM) NAIL INTERTROCHANTRIC;  Surgeon: [name], MD;  Location: [facility];  Service: Orthopedics;  Laterality: Left;   OVARY SURGERY     RIGHT HEART CATH N/A 08/12/2021   Procedure: RIGHT HEART CATH;  Surgeon: Lorretta Harp, MD;  Location: [facility];  Service: Cardiovascular;  Laterality: N/A;   SHOULDER SURGERY Left    TONSILLECTOMY  age 29   VAGINAL HYSTERECTOMY     VIDEO BRONCHOSCOPY Bilateral 05/31/2013   Procedure: VIDEO BRONCHOSCOPY WITHOUT FLUORO;  Surgeon: Brand Males, MD;  Location: [facility];  Service: Cardiopulmonary;  Laterality:  Bilateral;   video bronscoscopy  05/19/1998   lung   Past Surgical History:  Procedure Laterality Date   BRAIN SURGERY     Gamma knife 10/13. Needs repeat spring  '14   CARDIAC CATHETERIZATION N/A 10/31/2014   Procedure: Right/Left Heart Cath and Coronary Angiography;  Surgeon: [name], MD;  Location: [facility];  Service: Cardiovascular;  Laterality: N/A;   CYST EXCISION     2 on face   CYST REMOVAL NECK     ESOPHAGOGASTRODUODENOSCOPY (EGD) WITH PROPOFOL N/A 09/14/2014   Procedure: ESOPHAGOGASTRODUODENOSCOPY (EGD) WITH PROPOFOL;  Surgeon: Herbie Baltimore  Shaaron Adler, MD;  Location: [facility];  Service: Endoscopy;  Laterality: N/A;   INCISION AND DRAINAGE HIP Left 01/16/2017   Procedure: IRRIGATION AND DEBRIDEMENT LEFT HIP;  Surgeon: [name], MD;  [facility];  Service: Orthopedics;  Laterality: Left;   INTRAMEDULLARY (IM) NAIL INTERTROCHANTERIC Left 11/29/2016   Procedure: INTRAMEDULLARY (IM) NAIL INTERTROCHANTRIC;  Surgeon: [name], MD;  Location: [facility];  Service: Orthopedics;  Laterality: Left;   OVARY SURGERY     RIGHT HEART CATH N/A 08/12/2021   Procedure: RIGHT HEART CATH;  Surgeon: Lorretta Harp, MD;  Location: [facility];  Service: Cardiovascular;  Laterality: N/A;   SHOULDER SURGERY Left    TONSILLECTOMY  age 53   VAGINAL HYSTERECTOMY     VIDEO BRONCHOSCOPY Bilateral 05/31/2013   Procedure: VIDEO BRONCHOSCOPY WITHOUT FLUORO;  Surgeon: Brand Males, MD;  Location: [facility];  Service: Cardiopulmonary;  Laterality: Bilateral;   video bronscoscopy  05/19/1998   lung   Past Medical History:  Diagnosis Date   Acute on chronic diastolic (congestive) heart failure (HCC)    Anemia    iron deficiency anemia - secondary to blood loss ( chronic)    Anxiety    Arthritis    endstage changes bilateral knees/bilateral ankles.    Asthma    Carotid artery occlusion    Chronic fatigue    Chronic kidney disease     Closed left hip fracture (HCC)    Clotting disorder ([place])    pt denies this   Contusion of left knee    due to fall 1/14.   COPD (chronic obstructive pulmonary disease) (HCC)    pulmonary fibrosis   Depression, reactive    Diabetes mellitus    type II    Diastolic dysfunction    Difficulty in walking    Family history of heart disease    Generalized muscle weakness    Gout    High cholesterol    History of falling    Hypertension    Hypothyroidism    Interstitial lung disease ([place])    Meningioma of left sphenoid wing involving cavernous sinus ([place]) 02/17/2012   Continue diplopia, left eye pain and left headaches.     Morbid obesity ([place])    Neuromuscular disorder ([place])    diabetic neuropathy    Normal coronary arteries    cardiac catheterization performed  10/31/14   Pneumonia    RA (rheumatoid arthritis) ([place])    has been off methotreaxte since 10/13.   Spinal stenosis of lumbar region    Thyroid disease    Unspecified lack of coordination    URI (upper respiratory infection)    There were no vitals taken for this visit.  Opioid Risk Score:   Fall Risk Score:  `1  Depression screen [facility] 2/9     02/10/2022   11:39 AM 12/20/2021    3:26 PM 12/09/2021    1:12 PM 12/02/2021   11:22 AM 10/23/2021   11:52 AM 09/25/2021    3:21 PM 08/19/2021   10:55 AM  Depression screen PHQ 2/9  Decreased Interest _0 0 0 1 2  Down, Depressed, Hopeless _1 0 0 1 1  PHQ - 2 Score _2 0 0 2 3  Altered sleeping  1 1  0 0 1  Tired, decreased energy _3 0 2 1  Change in appetite 0 1 1  0 1   Feeling bad or failure  about yourself  0 2 2  0 1 1  Trouble concentrating 0 2 1  0 1 1  Moving slowly or fidgety/restless 0 2 2  0 2 2  Suicidal thoughts 0 0 1  0 0 0  PHQ-9 Score  13 14  0 9 9  Difficult doing work/chores Very difficult Very difficult Extremely dIfficult    Extremely dIfficult      Review of Systems  Musculoskeletal:  Positive for back pain.       Bilateral knee  pain Right shoulder pian Right arm pain Bilateral hamstring pain  All other systems reviewed and are negative.     Objective:   Physical Exam Vitals and nursing note reviewed.  Constitutional:      Appearance: Normal appearance. She is obese.  Cardiovascular:     Rate and Rhythm: Normal rate and regular rhythm.     Pulses: Normal pulses.     Heart sounds: Normal heart sounds.  Pulmonary:     Effort: Pulmonary effort is normal.     Breath sounds: Normal breath sounds.  Musculoskeletal:     Cervical back: Normal range of motion and neck supple.     Right lower leg: Edema present.     Left lower leg: Edema present.     Comments: Normal Muscle Bulk and Muscle Testing Reveals:  Upper Extremities: Decreased ROM 45 Degrees  and Muscle Strength  4/5 Right AC Joint Tenderness Thoracic Hypersensitivity  Lumbar Paraspinal Tenderness: L-4-L-5 Bilateral Greater Trochanter tenderness Lower Extremities: Decreased ROM and Muscle Strength 4/5 Bilateral Lower Extremities Flexion Produces Pain into her Bilateral Patella's Arrived in wheelchair     Skin:    General: Skin is warm and dry.  Neurological:     Mental Status: She is alert and oriented to person, place, and time.  Psychiatric:        Mood and Affect: Mood normal.        Behavior: Behavior normal.          Assessment & Plan:

## 2022-04-01 ENCOUNTER — Encounter: Payer: Medicare Other | Admitting: Internal Medicine

## 2022-04-01 ENCOUNTER — Encounter: Payer: Self-pay | Admitting: Internal Medicine

## 2022-04-01 ENCOUNTER — Ambulatory Visit (INDEPENDENT_AMBULATORY_CARE_PROVIDER_SITE_OTHER): Payer: Medicare Other | Admitting: Internal Medicine

## 2022-04-01 VITALS — BP 120/74 | HR 97 | Temp 98.0°F | Ht 64.0 in | Wt 221.8 lb

## 2022-04-01 DIAGNOSIS — M359 Systemic involvement of connective tissue, unspecified: Secondary | ICD-10-CM | POA: Diagnosis not present

## 2022-04-01 DIAGNOSIS — Z23 Encounter for immunization: Secondary | ICD-10-CM | POA: Diagnosis not present

## 2022-04-01 DIAGNOSIS — J8489 Other specified interstitial pulmonary diseases: Secondary | ICD-10-CM | POA: Diagnosis not present

## 2022-04-01 DIAGNOSIS — Z7952 Long term (current) use of systemic steroids: Secondary | ICD-10-CM

## 2022-04-01 DIAGNOSIS — Z8739 Personal history of other diseases of the musculoskeletal system and connective tissue: Secondary | ICD-10-CM | POA: Diagnosis not present

## 2022-04-01 DIAGNOSIS — Z5181 Encounter for therapeutic drug level monitoring: Secondary | ICD-10-CM | POA: Diagnosis not present

## 2022-04-01 DIAGNOSIS — J849 Interstitial pulmonary disease, unspecified: Secondary | ICD-10-CM

## 2022-04-01 DIAGNOSIS — Z006 Encounter for examination for normal comparison and control in clinical research program: Secondary | ICD-10-CM

## 2022-04-02 ENCOUNTER — Other Ambulatory Visit: Payer: Self-pay

## 2022-04-02 ENCOUNTER — Encounter: Payer: Self-pay | Admitting: Registered Nurse

## 2022-04-02 ENCOUNTER — Telehealth: Payer: Self-pay | Admitting: Internal Medicine

## 2022-04-02 DIAGNOSIS — R933 Abnormal findings on diagnostic imaging of other parts of digestive tract: Secondary | ICD-10-CM

## 2022-04-02 DIAGNOSIS — R1032 Left lower quadrant pain: Secondary | ICD-10-CM

## 2022-04-02 NOTE — Telephone Encounter (Signed)
Ok to leave on my schedule and not interrupt anticoagulation JMP

## 2022-04-02 NOTE — Telephone Encounter (Signed)
Dr. Hilarie Fredrickson please see notes below. Pt was scheduled for colonoscopy with Dr. Bryan Lemma at [facility] but had phone trouble and missed her cardiology phone appointment so procedure had to be canceled. Pt has since had a cardiology telephone appointment and they said the pt would be at acceptable risk for the colonoscopy procedure without further testing. They said the procedure needs to be done on Eliquis. You have availability on 04/14/22 from 9:30 to 10:30 am at [facility]. Could pt be added to 11/27?

## 2022-04-02 NOTE — Telephone Encounter (Signed)
Pt scheduled for colonoscopy on 04/14/22 at 9:30 am with Dr. Hilarie Fredrickson at [facility]. Ambulatory referral placed.

## 2022-04-02 NOTE — Telephone Encounter (Signed)
Called patient and discussed options, she is aware that cardiology doesn't want her to hold anticoagulation yet, she wants to proceed as scheduled and understood that it will be only diagnostic and we will not be able to do any major interventions.  Also discussed that she is at significant risk for anaesthesia and procedure related potential complications, she would like to know what's going on and wants to proceed with colonoscopy  She was provided an opportunity to ask questions and all were answered. The patient agreed with the plan and demonstrated an understanding of the instructions.

## 2022-04-02 NOTE — Telephone Encounter (Signed)
Ok to use my appt slot Pt needs to understand that if we proceed on Eliquis some therapeutic options will be limited. I do not know this pt directly, but on review the colon is being done to exclude mass but also fistula.   Dr. Silverio Decamp if you feel ok we can proceed, however, if you think we should wait 3 more weeks to allow DOAC to be stopped/held then let me know. JMP

## 2022-04-02 NOTE — Telephone Encounter (Signed)
Thank you for doing the case. [name] had already discussed with patient regarding either doing diagnostic study on anticoagulation, possible limitation with large polypectomy on anticoagulation or waiting until we can safely hold them, patient wished to proceed

## 2022-04-02 NOTE — Telephone Encounter (Signed)
Patient returning Paula's call.

## 2022-04-03 ENCOUNTER — Encounter (HOSPITAL_COMMUNITY): Payer: Self-pay | Admitting: Internal Medicine

## 2022-04-03 LAB — DRUG TOX MONITOR 1 W/CONF, ORAL FLD

## 2022-04-03 LAB — DRUG TOX ALC METAB W/CON, ORAL FLD: Alcohol Metabolite: NEGATIVE ng/mL (ref <25)

## 2022-04-03 NOTE — Telephone Encounter (Addendum)
Pt still has clenpiq prep. Updated instructions sent to [facility] and mailed. Called pt to let her know instructions were sent. Pt verbalized understanding.

## 2022-04-03 NOTE — Telephone Encounter (Signed)
Pt scheduled 04/14/22 at 9:30 am at [facility] with Dr. Hilarie Fredrickson. See 03/18/22 telephone encounter.

## 2022-04-03 NOTE — Research (Signed)
Title: Chronic Fibrosing Interstitial Lung Disease with Progressive Phenotype Prospective Outcomes (ILD-PRO) Registry    Protocol #: IPF-PRO-SUB, Clinical Trials # [id number], Sponsor: [employer]   Protocol Version Amendment 4 dated 12Sep2019  and confirmed current on  Consent Version for today's visit date of  [place] IRB Approved Version 22 Apr 2018 Revised 22 Apr 2018   Objectives:  Describe current approaches to diagnosis and treatment of chronic fibrosing ILDs with progressive phenotype  Describe the natural history of chronic fibrosing ILDs with progressive phenotype  Assess quality of life from self-administered participant reported questionnaires for each disease group  Describe participant interactions with the healthcare system, describe treatment practices across multiple institutions for each disease group  Collect biological samples linked to well characterized chronic fibrosing ILDs with progressive phenotype to identify disease biomarkers  Collect data and biological samples that will support future research studies.                                            Key Inclusion Criteria: Willing and able to provide informed consent  Age ? 30 years  Diagnosis of a non-IPF ILD of any duration, including, but not limited to Idiopathic Non-Specific Interstitial Pneumonia (INSIP), Unclassifiable Idiopathic Interstitial Pneumonias (IIPs), Interstitial Pneumonia with Autoimmune Features (IPAF), Autoimmune ILDs such as Rheumatoid Arthritis (RA-ILD) and Systemic Sclerosis (SSC-ILD), Chronic Hypersensitivity Pneumonitis (HP), Sarcoidosis or Exposure-related ILDs such as asbestosis.  Chronic fibrosing ILD defined by reticular abnormality with traction bronchiectasis with or without honeycombing confirmed by chest HRCT scan and/or lung biopsy.  Progressive phenotype as defined by fulfilling at least one of the criteria below of fibrotic changes (progression set point)  within the last 24 months regardless of treatment considered appropriate in individual ILDs:  decline in FVC % predicted (% pred) based on >10% relative decline  decline in FVC % pred based on ? 5 - <10% relative decline in FVC combined with worsening of respiratory symptoms as assessed by the site investigator  decline in FVC % pred based on ? 5 - <10% relative decline in FVC combined with increasing extent of fibrotic changes on chest imaging (HRCT scan) as assessed by the site investigator  decline in DLCO % pred based on ? 10% relative decline  worsening of respiratory symptoms as well as increasing extent of fibrotic changes on chest imaging (HRCT scan) as assessed by the site investigator independent of FVC change.     Key Exclusion Criteria: Malignancy, treated or untreated, other than skin or early stage prostate cancer, within the past 5 years  Currently listed for lung transplantation at the time of enrollment  Currently enrolled in a clinical trial at the time of enrollment in this registry       Clinical Research Coordinator / Research RN note : This visit for Arlyce FGarnet Koyanagi Subject [phone] with [id number] on [phone] for the above protocol is Visit/Encounter Baseline/Enrollment and is for purpose of research.    Subject expressed continued interest and consent in continuing as a study subject. Subject confirmed that there was no change in contact information (e.g. address, telephone, email). Subject thanked for participation in research and contribution to science.     During this visit on 14Nov2023 , the subject completed the blood work and questionnaires per the above referenced protocol. Please refer to the subject's paper source binder for further details.   Signed  by Brady Assistant [facility]  [place], [place] 16Nov2023 4:43pm

## 2022-04-08 ENCOUNTER — Ambulatory Visit: Payer: Medicare Other

## 2022-04-14 ENCOUNTER — Ambulatory Visit: Payer: Medicare Other | Admitting: Family Medicine

## 2022-04-14 ENCOUNTER — Encounter (HOSPITAL_COMMUNITY)
Admission: RE | Disposition: A | Discharge status: Home / Self Care | Payer: Self-pay | Source: Ambulatory Visit | Attending: Internal Medicine

## 2022-04-14 ENCOUNTER — Encounter (HOSPITAL_COMMUNITY): Payer: Self-pay | Admitting: Internal Medicine

## 2022-04-14 ENCOUNTER — Ambulatory Visit (HOSPITAL_COMMUNITY)
Admission: RE | Admit: 2022-04-14 | Discharge: 2022-04-14 | Disposition: A | Discharge status: Home / Self Care | Payer: Medicare Other | Source: Ambulatory Visit | Attending: Internal Medicine | Admitting: Internal Medicine

## 2022-04-14 ENCOUNTER — Telehealth: Payer: Self-pay

## 2022-04-14 ENCOUNTER — Other Ambulatory Visit: Payer: Self-pay

## 2022-04-14 ENCOUNTER — Ambulatory Visit (HOSPITAL_BASED_OUTPATIENT_CLINIC_OR_DEPARTMENT_OTHER): Payer: Medicare Other | Admitting: Anesthesiology

## 2022-04-14 ENCOUNTER — Ambulatory Visit (HOSPITAL_COMMUNITY): Payer: Medicare Other | Admitting: Anesthesiology

## 2022-04-14 DIAGNOSIS — K644 Residual hemorrhoidal skin tags: Secondary | ICD-10-CM | POA: Insufficient documentation

## 2022-04-14 DIAGNOSIS — Z7901 Long term (current) use of anticoagulants: Secondary | ICD-10-CM | POA: Insufficient documentation

## 2022-04-14 DIAGNOSIS — J449 Chronic obstructive pulmonary disease, unspecified: Secondary | ICD-10-CM | POA: Diagnosis not present

## 2022-04-14 DIAGNOSIS — N189 Chronic kidney disease, unspecified: Secondary | ICD-10-CM | POA: Insufficient documentation

## 2022-04-14 DIAGNOSIS — M069 Rheumatoid arthritis, unspecified: Secondary | ICD-10-CM | POA: Diagnosis not present

## 2022-04-14 DIAGNOSIS — M109 Gout, unspecified: Secondary | ICD-10-CM | POA: Insufficient documentation

## 2022-04-14 DIAGNOSIS — K648 Other hemorrhoids: Secondary | ICD-10-CM | POA: Diagnosis not present

## 2022-04-14 DIAGNOSIS — F419 Anxiety disorder, unspecified: Secondary | ICD-10-CM | POA: Insufficient documentation

## 2022-04-14 DIAGNOSIS — E039 Hypothyroidism, unspecified: Secondary | ICD-10-CM | POA: Insufficient documentation

## 2022-04-14 DIAGNOSIS — F418 Other specified anxiety disorders: Secondary | ICD-10-CM | POA: Diagnosis not present

## 2022-04-14 DIAGNOSIS — K56699 Other intestinal obstruction unspecified as to partial versus complete obstruction: Secondary | ICD-10-CM

## 2022-04-14 DIAGNOSIS — I5032 Chronic diastolic (congestive) heart failure: Secondary | ICD-10-CM | POA: Insufficient documentation

## 2022-04-14 DIAGNOSIS — E1122 Type 2 diabetes mellitus with diabetic chronic kidney disease: Secondary | ICD-10-CM | POA: Insufficient documentation

## 2022-04-14 DIAGNOSIS — K5730 Diverticulosis of large intestine without perforation or abscess without bleeding: Secondary | ICD-10-CM

## 2022-04-14 DIAGNOSIS — D122 Benign neoplasm of ascending colon: Secondary | ICD-10-CM

## 2022-04-14 DIAGNOSIS — Z79891 Long term (current) use of opiate analgesic: Secondary | ICD-10-CM | POA: Insufficient documentation

## 2022-04-14 DIAGNOSIS — J45909 Unspecified asthma, uncomplicated: Secondary | ICD-10-CM | POA: Diagnosis not present

## 2022-04-14 DIAGNOSIS — F32A Depression, unspecified: Secondary | ICD-10-CM | POA: Diagnosis not present

## 2022-04-14 DIAGNOSIS — I130 Hypertensive heart and chronic kidney disease with heart failure and stage 1 through stage 4 chronic kidney disease, or unspecified chronic kidney disease: Secondary | ICD-10-CM | POA: Diagnosis not present

## 2022-04-14 DIAGNOSIS — D124 Benign neoplasm of descending colon: Secondary | ICD-10-CM

## 2022-04-14 DIAGNOSIS — R1032 Left lower quadrant pain: Secondary | ICD-10-CM | POA: Insufficient documentation

## 2022-04-14 DIAGNOSIS — M1990 Unspecified osteoarthritis, unspecified site: Secondary | ICD-10-CM | POA: Diagnosis not present

## 2022-04-14 DIAGNOSIS — R933 Abnormal findings on diagnostic imaging of other parts of digestive tract: Secondary | ICD-10-CM | POA: Diagnosis not present

## 2022-04-14 DIAGNOSIS — K219 Gastro-esophageal reflux disease without esophagitis: Secondary | ICD-10-CM | POA: Insufficient documentation

## 2022-04-14 HISTORY — PX: COLONOSCOPY WITH PROPOFOL: SHX5780

## 2022-04-14 LAB — GLUCOSE, CAPILLARY: Glucose-Capillary: 117 mg/dL — ABNORMAL HIGH (ref 70–99)

## 2022-04-14 SURGERY — COLONOSCOPY WITH PROPOFOL
Anesthesia: Monitor Anesthesia Care

## 2022-04-14 MED ORDER — PROPOFOL 10 MG/ML IV BOLUS
INTRAVENOUS | Status: DC | PRN
  Administered 2022-04-14: 30 mg via INTRAVENOUS

## 2022-04-14 MED ORDER — PROPOFOL 500 MG/50ML IV EMUL
INTRAVENOUS | Status: DC | PRN
  Administered 2022-04-14: 120 ug/kg/min via INTRAVENOUS

## 2022-04-14 MED ORDER — LACTATED RINGERS IV SOLN
INTRAVENOUS | Status: AC | PRN
  Administered 2022-04-14: 1000 mL via INTRAVENOUS

## 2022-04-14 SURGICAL SUPPLY — 22 items
ELECT REM PT RETURN 9FT ADLT (ELECTROSURGICAL)
ELECTRODE REM PT RTRN 9FT ADLT (ELECTROSURGICAL) IMPLANT
FCP BXJMBJMB 240X2.8X (CUTTING FORCEPS)
FLOOR PAD 36X40 (MISCELLANEOUS) ×1
FORCEPS BIOP RAD 4 LRG CAP 4 (CUTTING FORCEPS) IMPLANT
FORCEPS BIOP RJ4 240 W/NDL (CUTTING FORCEPS)
FORCEPS BXJMBJMB 240X2.8X (CUTTING FORCEPS) IMPLANT
INJECTOR/SNARE I SNARE (MISCELLANEOUS) IMPLANT
LUBRICANT JELLY 4.5OZ STERILE (MISCELLANEOUS) IMPLANT
MANIFOLD NEPTUNE II (INSTRUMENTS) IMPLANT
NDL SCLEROTHERAPY 25GX240 (NEEDLE) IMPLANT
NEEDLE SCLEROTHERAPY 25GX240 (NEEDLE) IMPLANT
PAD FLOOR 36X40 (MISCELLANEOUS) ×1 IMPLANT
PROBE APC STR FIRE (PROBE) IMPLANT
PROBE INJECTION GOLD (MISCELLANEOUS)
PROBE INJECTION GOLD 7FR (MISCELLANEOUS) IMPLANT
SNARE ROTATE MED OVAL 20MM (MISCELLANEOUS) IMPLANT
SYR 50ML LL SCALE MARK (SYRINGE) IMPLANT
TRAP SPECIMEN MUCOUS 40CC (MISCELLANEOUS) IMPLANT
TUBING ENDO SMARTCAP PENTAX (MISCELLANEOUS) IMPLANT
TUBING IRRIGATION ENDOGATOR (MISCELLANEOUS) ×1 IMPLANT
WATER STERILE IRR 1000ML POUR (IV SOLUTION) IMPLANT

## 2022-04-14 NOTE — Telephone Encounter (Signed)
-----  Message from Jerene Bears, MD sent at 04/14/2022 10:39 AM EST ----- Veena Colonoscopy performed today.  Benign, but sigmoid narrowing.  I think diverticular in nature.  I recommended at least surgical opinion, but obviously high risk.  Not narrow enough to stent and if there is a fistula I couldn't clearly identify. Linda or [facility] RN covering her, who reviews my hospital cases should refer to [facility], but she will need followup with you too Thanks JMP

## 2022-04-14 NOTE — Telephone Encounter (Signed)
Records faxed to [facility] requesting a consult and treat.

## 2022-04-14 NOTE — Anesthesia Preprocedure Evaluation (Addendum)
Anesthesia Evaluation  Patient identified by MRN, date of birth, ID band Patient awake    Reviewed: Allergy & Precautions, NPO status , Patient's Chart, lab work & pertinent test results, reviewed documented beta blocker date and time   History of Anesthesia Complications Negative for: history of anesthetic complications  Airway Mallampati: II  TM Distance: >3 FB Neck ROM: Full    Dental  (+) Dental Advisory Given, Upper Dentures   Pulmonary asthma , COPD  ILD    Pulmonary exam normal        Cardiovascular hypertension, Pt. on medications and Pt. on home beta blockers +CHF and + DOE  Normal cardiovascular exam   '23 TTE - EF 55 to 60%. There is mild left ventricular hypertrophy. Grade I diastolic dysfunction (impaired relaxation). Right ventricular systolic function is mildly reduced. Trivial MR    Neuro/Psych  PSYCHIATRIC DISORDERS Anxiety Depression     Neuromuscular disease    GI/Hepatic ,GERD  Controlled,,  Endo/Other  diabetes, Type 2Hypothyroidism   Obesity   Renal/GU CRFRenal disease     Musculoskeletal  (+) Arthritis , Rheumatoid disorders,  narcotic dependent Gout    Abdominal   Peds  Hematology negative hematology ROS (+)   Anesthesia Other Findings On GLP-1a   Reproductive/Obstetrics                             Anesthesia Physical Anesthesia Plan  ASA: 3  Anesthesia Plan: MAC   Post-op Pain Management: Minimal or no pain anticipated   Induction:   PONV Risk Score and Plan: 2 and Propofol infusion and Treatment may vary due to age or medical condition  Airway Management Planned: Natural Airway and Simple Face Mask  Additional Equipment: None  Intra-op Plan:   Post-operative Plan:   Informed Consent: I have reviewed the patients History and Physical, chart, labs and discussed the procedure including the risks, benefits and alternatives for the proposed  anesthesia with the patient or authorized representative who has indicated his/her understanding and acceptance.       Plan Discussed with: CRNA and Anesthesiologist  Anesthesia Plan Comments:        Anesthesia Quick Evaluation

## 2022-04-14 NOTE — Transfer of Care (Signed)
Immediate Anesthesia Transfer of Care Note  Patient: [name]  Procedure(s) Performed: COLONOSCOPY WITH PROPOFOL  Patient Location: PACU and Endoscopy Unit  Anesthesia Type:MAC  Level of Consciousness: awake and drowsy  Airway & Oxygen Therapy: Patient Spontanous Breathing and Patient connected to nasal cannula oxygen  Post-op Assessment: Report given to RN and Post -op Vital signs reviewed and stable  Post vital signs: Reviewed and stable  Last Vitals:  Vitals Value Taken Time  BP 101/60 04/14/22 1028  Temp    Pulse 90 04/14/22 1029  Resp 13 04/14/22 1029  SpO2 97 % 04/14/22 1029  Vitals shown include unvalidated device data.  Last Pain:  Vitals:   04/14/22 1028  TempSrc: Temporal  PainSc:          Complications: No notable events documented.

## 2022-04-14 NOTE — H&P (Signed)
GASTROENTEROLOGY PROCEDURE H&P NOTE   Primary Care Physician: [name], MD    Reason for Procedure:  Left lower quadrant pain, abnormal CT scan of the colon suggesting possible sigmoid to bladder fistula  Plan:    Colonoscopy  Patient is appropriate for endoscopic procedure in the outpatient hospital setting  The nature of the procedure, as well as the risks, benefits, and alternatives were carefully and thoroughly reviewed with the patient. Ample time for discussion and questions allowed. The patient understood, was satisfied, and agreed to proceed.     HPI: [name] is a 81 y.o. female who presents for colonoscopy.  Medical history as below.  Tolerated the prep.  No recent chest pain or shortness of breath.  Mild left lower quadrant abdominal pain which for her is chronic.  History of complicated medical history including COPD on oxygen, CHF and PE.  Eliquis not on hold or interrupted after discussion with prescribing provider.  Patient and daughter aware that this limits therapeutic options today during this procedure.  Higher than average risk given medical comorbidities but also possible colonic stricturing versus fistula.  We discussed the risk, benefits and alternatives and she is agreeable and wishes to proceed.  Past Medical History:  Diagnosis Date   Acute on chronic diastolic (congestive) heart failure (HCC)    Anemia    iron deficiency anemia - secondary to blood loss ( chronic)    Anxiety    Arthritis    endstage changes bilateral knees/bilateral ankles.    Asthma    Carotid artery occlusion    Chronic fatigue    Chronic kidney disease    Closed left hip fracture (HCC)    Clotting disorder ([place])    pt denies this   Contusion of left knee    due to fall 1/14.   COPD (chronic obstructive pulmonary disease) (HCC)    pulmonary fibrosis   Depression, reactive    Diabetes mellitus    type II    Diastolic dysfunction    Difficulty in walking     Family history of heart disease    Generalized muscle weakness    Gout    High cholesterol    History of falling    Hypertension    Hypothyroidism    Interstitial lung disease ([place])    Meningioma of left sphenoid wing involving cavernous sinus ([place]) 02/17/2012   Continue diplopia, left eye pain and left headaches.     Morbid obesity ([place])    Neuromuscular disorder ([place])    diabetic neuropathy    Normal coronary arteries    cardiac catheterization performed  10/31/14   Pneumonia    RA (rheumatoid arthritis) ([place])    has been off methotreaxte since 10/13.   Spinal stenosis of lumbar region    Thyroid disease    Unspecified lack of coordination    URI (upper respiratory infection)     Past Surgical History:  Procedure Laterality Date   BRAIN SURGERY     Gamma knife 10/13. Needs repeat spring  '14   CARDIAC CATHETERIZATION N/A 10/31/2014   Procedure: Right/Left Heart Cath and Coronary Angiography;  Surgeon: [name], MD;  Location: [facility];  Service: Cardiovascular;  Laterality: N/A;   CYST EXCISION     2 on face   CYST REMOVAL NECK     ESOPHAGOGASTRODUODENOSCOPY (EGD) WITH PROPOFOL N/A 09/14/2014   Procedure: ESOPHAGOGASTRODUODENOSCOPY (EGD) WITH PROPOFOL;  Surgeon: [name], MD;  Location: [facility];  Service: Endoscopy;  Laterality: N/A;   INCISION AND DRAINAGE HIP Left 01/16/2017   Procedure: IRRIGATION AND DEBRIDEMENT LEFT HIP;  Surgeon: [name], MD;  Location: [facility];  Service: Orthopedics;  Laterality: Left;   INTRAMEDULLARY (IM) NAIL INTERTROCHANTERIC Left 11/29/2016   Procedure: INTRAMEDULLARY (IM) NAIL INTERTROCHANTRIC;  Surgeon: [name], MD;  Location: [facility];  Service: Orthopedics;  Laterality: Left;   OVARY SURGERY     RIGHT HEART CATH N/A 08/12/2021   Procedure: RIGHT HEART CATH;  Surgeon: Lorretta Harp, MD;  Location: [facility];  Service: Cardiovascular;  Laterality: N/A;   SHOULDER  SURGERY Left    TONSILLECTOMY  age 7   VAGINAL HYSTERECTOMY     VIDEO BRONCHOSCOPY Bilateral 05/31/2013   Procedure: VIDEO BRONCHOSCOPY WITHOUT FLUORO;  Surgeon: Brand Males, MD;  Location: [facility];  Service: Cardiopulmonary;  Laterality: Bilateral;   video bronscoscopy  05/19/1998   lung    Prior to Admission medications   Medication Sig Start Date End Date Taking? Authorizing Provider  acetaminophen (TYLENOL) 500 MG tablet Take 500-1,000 mg by mouth every 6 (six) hours as needed for moderate pain.   Yes [provider]  albuterol (PROVENTIL) (2.5 MG/3ML) 0.083% nebulizer solution Take 3 mLs (2.5 mg total) by nebulization every 6 (six) hours as needed for wheezing. 05/03/21  Yes Medina-Vargas, Monina C, NP  albuterol (VENTOLIN HFA) 108 (90 Base) MCG/ACT inhaler Inhale 2 puffs into the lungs every 4 (four) hours as needed for wheezing or shortness of breath.   Yes [provider]  allopurinol (ZYLOPRIM) 300 MG tablet Take 1 tablet (300 mg total) by mouth daily. 05/03/21  Yes Medina-Vargas, Monina C, NP  amLODipine (NORVASC) 10 MG tablet Take 10 mg by mouth daily. 02/12/22  Yes [provider]  apixaban (ELIQUIS) 5 MG TABS tablet Take 1 tablet (5 mg total) by mouth 2 (two) times daily. 11/27/21  Yes Lorretta Harp, MD  Artificial Tear Ointment (DRY EYES OP) Place 1 drop into both eyes 2 (two) times daily as needed (dry eyes).   Yes [provider]  atorvastatin (LIPITOR) 20 MG tablet Take 0.5 tablets (10 mg total) by mouth every evening. 12/09/21  Yes [name], MD  azithromycin (ZITHROMAX) 250 MG tablet Take 250 mg by mouth 3 (three) times a week. 11/23/21  Yes [provider]  Calcium Carb-Cholecalciferol (CALCIUM + D3 PO) Take 2 tablets by mouth daily with lunch.    Yes [provider]  Cholecalciferol (VITAMIN D-3) 25 MCG (1000 UT) CAPS Take 2 capsules (2,000 Units total) by mouth daily. GIVE ONE TAB PO Q DAY 05/03/21  Yes  Medina-Vargas, Monina C, NP  colchicine 0.6 MG tablet Take 1 tablet (0.6 mg total) by mouth daily as needed (gout flares). 05/03/21  Yes Medina-Vargas, Monina C, NP  cyclobenzaprine (FLEXERIL) 5 MG tablet Take 1 tablet (5 mg total) by mouth at bedtime as needed for muscle spasms. 12/02/21  Yes [name], NP  diclofenac Sodium (VOLTAREN) 1 % GEL Apply 2 g topically 3 (three) times daily. Apply to both hands and knees Patient taking differently: Apply 2 g topically 3 (three) times daily as needed (pain). Apply to both hands and knees 05/03/21  Yes Medina-Vargas, Monina C, NP  donepezil (ARICEPT) 5 MG tablet Take 1 tablet (5 mg total) by mouth at bedtime. 03/06/22  Yes Melvenia Beam, MD  Dulaglutide (TRULICITY) 1.69 CV/8.9FY SOPN Inject 0.75 mg into the skin once a week.  Patient taking differently: Inject 0.75 mg into the skin every Tuesday. 12/09/21  Yes [name], MD  DULoxetine (CYMBALTA) 60 MG capsule Take 1 capsule (60 mg total) by mouth daily. 12/02/21  Yes [name], NP  fluticasone (FLONASE) 50 MCG/ACT nasal spray Place 2 sprays into both nostrils daily. 12/09/21  Yes [name], MD  furosemide (LASIX) 80 MG tablet Take 1 tablet (80 mg total) by mouth daily. 08/02/21  Yes Lorretta Harp, MD  gabapentin (NEURONTIN) 600 MG tablet Take 2 tablets (1,200 mg total) by mouth 3 (three) times daily. 12/02/21  Yes [name], NP  guaiFENesin (MUCINEX) 600 MG 12 hr tablet Take 2 tablets (1,200 mg total) by mouth 2 (two) times daily. 05/03/21  Yes Medina-Vargas, Monina C, NP  hydrALAZINE (APRESOLINE) 25 MG tablet Take 1 tablet (25 mg total) by mouth 2 (two) times daily. 02/25/22  Yes Lorretta Harp, MD  hydroxychloroquine (PLAQUENIL) 200 MG tablet Take 1 tablet (200 mg total) by mouth daily. 05/03/21  Yes Medina-Vargas, Monina C, NP  leflunomide (ARAVA) 20 MG tablet Take 1 tablet (20 mg total) by mouth daily. 05/03/21 08/09/23 Yes Medina-Vargas, Monina C, NP  levothyroxine  (SYNTHROID) 50 MCG tablet Take 1 tablet (50 mcg total) by mouth daily. 09/25/21  Yes [name], MD  linaclotide [facility]) 72 MCG capsule Take 1 capsule (72 mcg total) by mouth daily before breakfast. 12/09/21  Yes [name], MD  metoprolol succinate (TOPROL-XL) 100 MG 24 hr tablet Take 1 tablet by mouth once daily along with 1 metoprolol 71m tablet 02/25/22  Yes BLorretta Harp MD  metoprolol succinate (TOPROL-XL) 25 MG 24 hr tablet Take 5 tablets (125 mg total) by mouth daily. Patient taking differently: Take 25 mg by mouth daily. 12/09/21  Yes BBillie Ruddy MD  morphine (MS CONTIN) 15 MG 12 hr tablet Take 1 tablet (15 mg total) by mouth every 12 (twelve) hours. Do Not Fill Before 04/10/2022 03/31/22  Yes TBayard Hugger NP  morphine (MSIR) 15 MG tablet Take 1 tablet (15 mg total) by mouth 2 (two) times daily. Patient taking differently: Take 15 mg by mouth 2 (two) times daily as needed (breakthrough pain). 03/31/22  Yes TBayard Hugger NP  Multiple Vitamin (MULTIVITAMIN WITH MINERALS) TABS Take 1 tablet by mouth daily.    Yes [provider]  olmesartan (BENICAR) 40 MG tablet Take 1 tablet (40 mg total) by mouth daily. 02/10/22  Yes BBillie Ruddy MD  ondansetron (ZOFRAN-ODT) 4 MG disintegrating tablet Take 1 tablet (4 mg total) by mouth every 8 (eight) hours as needed for nausea or vomiting. 03/01/22  Yes ZFredia Sorrow MD  OXYGEN Inhale 3 L into the lungs See admin instructions. 3 L at bedtime, and 3 L during the day as needed for exertion   Yes [provider]  pantoprazole (PROTONIX) 40 MG tablet take 1 tablet by mouth once daily MINUTES BEFORE 1ST MEAL OF THE DAY 09/25/21  Yes BBillie Ruddy MD  Potassium Chloride ER 20 MEQ TBCR Take 20 mEq by mouth daily. 01/08/22  Yes BBillie Ruddy MD  predniSONE (DELTASONE) 10 MG tablet Take 1 tablet (10 mg total) by mouth daily with breakfast. 05/22/21  Yes BBillie Ruddy MD  metolazone (ZAROXOLYN) 2.5 MG  tablet TAKE 1 TABLET BY MOUTH DAILY AS NEEDED FOR 2 DAYS FOR WEIGHT ABOVE 255 POUNDS 05/03/21   Medina-Vargas, Monina C, NP    Current Facility-Administered Medications  Medication Dose Route Frequency Provider Last Rate Last Admin   lactated ringers infusion    Continuous PRN Ivonne [place], Lajuan Lines, MD 10 mL/hr at 04/14/22 0938 1,000 mL at 04/14/22 0938    Allergies as of 04/02/2022 - Review Complete 04/02/2022  Allergen Reaction Noted   Codeine Swelling and Other (See Comments) 09/11/2011   Infliximab Anaphylaxis 09/11/2011   Lisinopril Swelling 09/11/2011   Ofev [nintedanib] Diarrhea 04/01/2022    Family History  Problem Relation Age of Onset   Diabetes Mother    Heart attack Mother    Hypertension Father    Lung cancer Father    Diabetes Sister    Breast cancer Sister    Breast cancer Sister    Diabetes Brother    Hypertension Brother    Heart disease Brother    Heart attack Brother    Kidney cancer Brother    Gout Brother    Kidney failure Brother        x 5   Esophageal cancer Brother    Stomach cancer Brother    Prostate cancer Brother    Uterine cancer Daughter        with mets   Fibroids Daughter    Hypertension Daughter    Hypertension Daughter    Hypertension Son    Heart murmur Son    Rheum arthritis Maternal Uncle    Alzheimer's disease Neg Hx    Dementia Neg Hx     Social History   Socioeconomic History   Marital status: Married    Spouse name: Not on file   Number of children: 6   Years of education: college   Highest education level: Not on file  Occupational History   Occupation: retired Marine scientist  Tobacco Use   Smoking status: Never   Smokeless tobacco: Never  Vaping Use   Vaping Use: Never used  Substance and Sexual Activity   Alcohol use: No   Drug use: No   Sexual activity: Not Currently  Other Topics Concern   Not on file  Social History Narrative   Patient consumes 2-3 cups coffee per day.   Social Determinants of Health   Financial  Resource Strain: Low Risk  (10/23/2021)   Overall Financial Resource Strain (CARDIA)    Difficulty of Paying Living Expenses: Not hard at all  Food Insecurity: No Food Insecurity (10/23/2021)   Hunger Vital Sign    Worried About Running Out of Food in the Last Year: Never true    Ran Out of Food in the Last Year: Never true  Transportation Needs: No Transportation Needs (10/23/2021)   PRAPARE - Hydrologist (Medical): No    Lack of Transportation (Non-Medical): No  Physical Activity: Insufficiently Active (10/23/2021)   Exercise Vital Sign    Days of Exercise per Week: 2 days    Minutes of Exercise per Session: 40 min  Stress: No Stress Concern Present (10/18/2020)   [place]    Feeling of Stress : Not at all  Social Connections: [place] (10/23/2021)   Social Connection and Isolation Panel [NHANES]    Frequency of Communication with Friends and Family: More than three times a week    Frequency of Social Gatherings with Friends and Family: More than three times a week    Attends Religious Services: More than 4 times per year    Active Member of [employer] or Organizations: Yes    Attends Club or  Organization Meetings: More than 4 times per year    Marital Status: Married  Human resources officer Violence: Not At Risk (10/23/2021)   Humiliation, Afraid, Rape, and Kick questionnaire    Fear of Current or Ex-Partner: No    Emotionally Abused: No    Physically Abused: No    Sexually Abused: No    Physical Exam: Vital signs in last 24 hours: _0  (!) 160/78   Pulse 98   Temp 97.6 F (36.4 C) (Temporal)   Resp 18   Ht _1  (1.626 m)   Wt 100.6 kg   SpO2 100%   BMI 38.07 kg/m  GEN: NAD EYE: Sclerae anicteric ENT: MMM CV: Non-tachycardic Pulm: CTA b/l GI: Soft, NT/ND NEURO:  Alert & Oriented x 3   Zenovia Jarred, MD [facility]  04/14/2022 9:45 AM

## 2022-04-14 NOTE — Op Note (Signed)
[facility] Patient Name: [name] Procedure Date: 04/14/2022 MRN: [id number] Attending MD: Jerene Bears , MD, [id number] Date of Birth: July 17, 1940 CSN: [id number] Age: 81 Admit Type: Outpatient Procedure:                Colonoscopy Indications:              Abdominal pain in the left lower quadrant, Abnormal                            CT of the GI tract Providers:                Lajuan Lines. Hilarie Fredrickson, MD, Mikey College, RN, Benetta Spar, Technician Referring MD:             [name] Medicines:                Monitored Anesthesia Care Complications:            No immediate complications. Estimated Blood Loss:     Estimated blood loss: none. Procedure:                Pre-Anesthesia Assessment:                           - Prior to the procedure, a History and Physical                            was performed, and patient medications and                            allergies were reviewed. The patient's tolerance of                            previous anesthesia was also reviewed. The risks                            and benefits of the procedure and the sedation                            options and risks were discussed with the patient.                            All questions were answered, and informed consent                            was obtained. Prior Anticoagulants: The patient has                            taken Eliquis (apixaban), last dose was day of                            procedure. ASA Grade Assessment: III - A patient  with severe systemic disease. After reviewing the                            risks and benefits, the patient was deemed in                            satisfactory condition to undergo the procedure.                           After obtaining informed consent, the colonoscope                            was passed under direct vision. Throughout the                             procedure, the patient's blood pressure, pulse, and                            oxygen saturations were monitored continuously. The                            PCF-HQ190L ([id number] Olympus colonoscope was                            introduced through the anus and advanced to the                            cecum, identified by the appendiceal orifice. The                            colonoscopy was technically difficult and complex                            due to multiple diverticula in the colon and                            restricted mobility of the colon. Successful                            completion of the procedure was aided by                            withdrawing the scope and replacing with the Ultra                            Slim scope. The patient tolerated the procedure                            well. The quality of the bowel preparation was                            excellent. The ileocecal valve, appendiceal  orifice, and rectum were photographed. Scope In: 9:57:01 AM Scope Out: 10:22:46 AM Scope Withdrawal Time: 0 hours 8 minutes 58 seconds  Total Procedure Duration: 0 hours 25 minutes 45 seconds  Findings:      A benign-appearing, intrinsic moderate stenosis measuring 4 cm (in       length) was found in the mid sigmoid colon and was traversed. This is       felt to be diverticular in origin and there was no sign of neoplasm in       this segment.      Multiple small-mouthed diverticula were found in the sigmoid colon and       distal descending colon. Overall there are a small number of       diverticula, but with resultant narrowing in the sigmoid as above.      An 8 mm polyp was found in the ascending colon. The polyp was sessile.       Polypectomy was not attempted due to the patient taking anticoagulation       medication.      A 3 mm polyp was found in the descending colon. The polyp was sessile.       Polypectomy was not attempted  due to the patient taking anticoagulation       medication.      External and internal hemorrhoids were found during retroflexion, during       perianal exam and during digital exam. The hemorrhoids were medium-sized. Impression:               - Stricturing in the mid sigmoid colon felt                            secondary to diverticulosis. Non-obstructing,                            though ultra slim colonoscope required to traverse                            and this is causing significant angulation. No                            evidence of neoplasm/malignancy.                           - Diverticulosis in the sigmoid colon and in the                            distal descending colon.                           - One 8 mm polyp in the ascending colon. Resection                            not attempted.                           - One 3 mm polyp in the descending colon. Resection  not attempted.                           - External and internal hemorrhoids.                           - No specimens collected. Moderate Sedation:      Not Applicable - Patient had care per Anesthesia. Recommendation:           - Patient has a contact number available for                            emergencies. The signs and symptoms of potential                            delayed complications were discussed with the                            patient. Return to normal activities tomorrow.                            Written discharge instructions were provided to the                            patient.                           - Resume previous diet.                           - Continue present medications.                           - MiraLax 17 g daily is recommended to avoid                            constipation/hard stools in the setting of colonic                            narrowing and diverticulosis.                           - General surgery referral to at least  discuss and                            consider sigmoid resection.                           - Follow-up with Dr. Silverio Decamp recommended. Procedure Code(s):        --- Professional ---                           [phone], Colonoscopy, flexible; diagnostic, including                            collection of specimen(s) by brushing or washing,  when performed (separate procedure) Diagnosis Code(s):        --- Professional ---                           [phone], Other intestinal obstruction unspecified                            as to partial versus complete obstruction                           K64.8, Other hemorrhoids                           D12.2, Benign neoplasm of ascending colon                           D12.4, Benign neoplasm of descending colon                           R10.32, Left lower quadrant pain                           K57.30, Diverticulosis of large intestine without                            perforation or abscess without bleeding                           R93.3, Abnormal findings on diagnostic imaging of                            other parts of digestive tract CPT copyright 2022 [employer]. All rights reserved. The codes documented in this report are preliminary and upon coder review may  be revised to meet current compliance requirements. Jerene Bears, MD 04/14/2022 10:36:29 AM This report has been signed electronically. Number of Addenda: 0

## 2022-04-14 NOTE — Discharge Instructions (Signed)
YOU HAD AN ENDOSCOPIC PROCEDURE TODAY: Refer to the procedure report and other information in the discharge instructions given to you for any specific questions about what was found during the examination. If this information does not answer your questions, please call [place] office at [phone] to clarify.   YOU SHOULD EXPECT: Some feelings of bloating in the abdomen. Passage of more gas than usual. Walking can help get rid of the air that was put into your GI tract during the procedure and reduce the bloating.   DIET: Your first meal following the procedure should be a light meal and then it is ok to progress to your normal diet. A half-sandwich or bowl of soup is an example of a good first meal. Heavy or fried foods are harder to digest and may make you feel nauseous or bloated. Drink plenty of fluids but you should avoid alcoholic beverages for 24 hours.   ACTIVITY: Your care partner should take you home directly after the procedure. You should plan to take it easy, moving slowly for the rest of the day. You can resume normal activity the day after the procedure however YOU SHOULD NOT DRIVE, use power tools, machinery or perform tasks that involve climbing or major physical exertion for 24 hours (because of the sedation medicines used during the test).   SYMPTOMS TO REPORT IMMEDIATELY: A gastroenterologist can be reached at any hour. Please call [phone]  for any of the following symptoms:  Following lower endoscopy (colonoscopy, flexible sigmoidoscopy) Excessive amounts of blood in the stool  Significant tenderness, worsening of abdominal pains  Swelling of the abdomen that is new, acute  Fever of 100 or higher    FOLLOW UP:  If any biopsies were taken you will be contacted by phone or by letter within the next 1-3 weeks. Call [phone]  if you have not heard about the biopsies in 3 weeks.  Please also call with any specific questions about appointments or follow up tests.

## 2022-04-14 NOTE — Anesthesia Postprocedure Evaluation (Signed)
Anesthesia Post Note  Patient: [name]  Procedure(s) Performed: COLONOSCOPY WITH PROPOFOL     Patient location during evaluation: PACU Anesthesia Type: MAC Level of consciousness: awake and alert Pain management: pain level controlled Vital Signs Assessment: post-procedure vital signs reviewed and stable Respiratory status: spontaneous breathing, nonlabored ventilation and respiratory function stable Cardiovascular status: stable and blood pressure returned to baseline Anesthetic complications: no   No notable events documented.  Last Vitals:  Vitals:   04/14/22 1040 04/14/22 1050  BP: (!) 167/91 (!) 151/92  Pulse: 99 95  Resp: 18 16  Temp:    SpO2: 98% 97%    Last Pain:  Vitals:   04/14/22 1050  TempSrc:   PainSc: 0-No pain                 Audry Pili

## 2022-04-15 ENCOUNTER — Telehealth: Payer: Self-pay | Admitting: Family Medicine

## 2022-04-15 DIAGNOSIS — I5032 Chronic diastolic (congestive) heart failure: Secondary | ICD-10-CM

## 2022-04-15 MED ORDER — POTASSIUM CHLORIDE ER 20 MEQ PO TBCR: 20.0000 meq | EXTENDED_RELEASE_TABLET | Freq: Every day | ORAL | 1 refills | Status: DC

## 2022-04-15 NOTE — Telephone Encounter (Signed)
Rquesting refill Potassium Chloride ER 20 MEQ TBCR   [facility] [place], [place] - [address] [facility] Phone: [phone]  Fax: [phone]    Requesting 90d supply

## 2022-04-15 NOTE — Addendum Note (Signed)
Addended by: Rodrigo Ran on: 04/15/2022 03:31 PM   Modules accepted: Orders

## 2022-04-15 NOTE — Telephone Encounter (Signed)
Rx sent in.

## 2022-04-16 ENCOUNTER — Encounter (HOSPITAL_COMMUNITY): Payer: Self-pay | Admitting: Internal Medicine

## 2022-04-17 ENCOUNTER — Ambulatory Visit: Payer: Medicare Other | Admitting: Speech Pathology

## 2022-04-17 DIAGNOSIS — Z79899 Other long term (current) drug therapy: Secondary | ICD-10-CM | POA: Diagnosis not present

## 2022-04-17 DIAGNOSIS — H26491 Other secondary cataract, right eye: Secondary | ICD-10-CM | POA: Diagnosis not present

## 2022-04-24 ENCOUNTER — Encounter: Payer: Medicare Other | Admitting: Speech Pathology

## 2022-04-24 DIAGNOSIS — M1A00X0 Idiopathic chronic gout, unspecified site, without tophus (tophi): Secondary | ICD-10-CM | POA: Diagnosis not present

## 2022-04-24 DIAGNOSIS — R1084 Generalized abdominal pain: Secondary | ICD-10-CM | POA: Diagnosis not present

## 2022-04-24 DIAGNOSIS — M0579 Rheumatoid arthritis with rheumatoid factor of multiple sites without organ or systems involvement: Secondary | ICD-10-CM | POA: Diagnosis not present

## 2022-04-24 DIAGNOSIS — Z7960 Long term (current) use of unspecified immunomodulators and immunosuppressants: Secondary | ICD-10-CM | POA: Diagnosis not present

## 2022-04-24 DIAGNOSIS — I5032 Chronic diastolic (congestive) heart failure: Secondary | ICD-10-CM | POA: Diagnosis not present

## 2022-04-24 DIAGNOSIS — Z79899 Other long term (current) drug therapy: Secondary | ICD-10-CM | POA: Diagnosis not present

## 2022-04-28 DIAGNOSIS — M0579 Rheumatoid arthritis with rheumatoid factor of multiple sites without organ or systems involvement: Secondary | ICD-10-CM | POA: Diagnosis not present

## 2022-04-28 DIAGNOSIS — J8410 Pulmonary fibrosis, unspecified: Secondary | ICD-10-CM | POA: Diagnosis not present

## 2022-04-28 DIAGNOSIS — R0609 Other forms of dyspnea: Secondary | ICD-10-CM | POA: Diagnosis not present

## 2022-04-30 DIAGNOSIS — I5020 Unspecified systolic (congestive) heart failure: Secondary | ICD-10-CM | POA: Diagnosis not present

## 2022-04-30 DIAGNOSIS — J438 Other emphysema: Secondary | ICD-10-CM | POA: Diagnosis not present

## 2022-04-30 DIAGNOSIS — K56699 Other intestinal obstruction unspecified as to partial versus complete obstruction: Secondary | ICD-10-CM | POA: Diagnosis not present

## 2022-04-30 DIAGNOSIS — G8928 Other chronic postprocedural pain: Secondary | ICD-10-CM | POA: Diagnosis not present

## 2022-04-30 DIAGNOSIS — I2699 Other pulmonary embolism without acute cor pulmonale: Secondary | ICD-10-CM | POA: Diagnosis not present

## 2022-05-01 ENCOUNTER — Encounter: Payer: Medicare Other | Admitting: Speech Pathology

## 2022-05-01 ENCOUNTER — Non-Acute Institutional Stay: Payer: Medicare Other | Admitting: Hospice

## 2022-05-23 ENCOUNTER — Telehealth: Payer: Self-pay | Admitting: Registered Nurse

## 2022-05-23 MED ORDER — MORPHINE SULFATE ER 15 MG PO TBCR: 15.0000 mg | EXTENDED_RELEASE_TABLET | Freq: Two times a day (BID) | ORAL | 0 refills | Status: DC

## 2022-05-23 MED ORDER — MORPHINE SULFATE 15 MG PO TABS: 15.0000 mg | ORAL_TABLET | Freq: Two times a day (BID) | ORAL | 0 refills | Status: DC

## 2022-05-23 NOTE — Telephone Encounter (Signed)
PMP was Reviewed.  MS Contin and MSIR e- scribed to pharmacy.  Ms. Truxillo daughter is aware via My-Chart.

## 2022-05-26 NOTE — Progress Notes (Unsigned)
OV 01/03/2015  Chief Complaint  Patient presents with   Follow-up    Pt c/o DOE, resolving cough that started over the weekend, chest discomfort when SOB and when lying down. Pt c/o increasing fatigue.    She has severe class 3 dyspnea with hypoxemia on account of BMI 50, diaast dsyfn, chronic pain, deconditioning and ILD due to RA on celcept/pred/bactrim   I personally last saw her in march 2-016. Since then has seen other provicers in this office and most recently in June 2016. In June 2016 admitted for acute CHF - cath showed mild pulm htn only with PA mean 24 and minimal CAD (reviewed chart of procedures June 2016). Dx as acute diast chf. Since then better. SHe had fu HRCT 11/29/14 that shows progression in ILD since march 2016 and now with UIP pattern (personally visualized image). She is finally accepting of fact that ILD is not makin cause of dyspnea and obesity is main problem. She is wondering about dc cellcept/pred/bactrim so as to minimize polypharmacy and side effect profile    OV 02/09/2015  Chief Complaint  Patient presents with   Follow-up    Pt states her breathing is doing well the past few days. Pt recently went to [facility] ED for CP. Pt c/o prod cough with tan mucus. Pt denies CP/tightness    She has severe class 3 dyspnea with hypoxemia on account of BMI 50, diaast dsyfn, chronic pain, deconditioning and ILD due to RA. She is having progressive ILD. But given her miserable quality of life and polypharmacy and other side effects we decided to stop the CellCept and Bactrim. We also advise prednisone taper. This was all in the end of August 2016. But on 01/27/2015 she ended up in the emergency room with some respiratory difficulty. Her prednisone was bumped up. She subsequently saw her rheumatologist [name] was now tapering her prednisone again. There is also some concerns of headaches and possibly temporal arteritis based on her description and apparently some kind of a biopsy  scheduled. She feels that since stopping her CellCept and Bactrim her cough is returned. Nevertheless weight and knee pain and back pain continued to be major issues for her along with polypharmacy. Her Polysorb was includes MS Contin  [name]    OV 02/21/2015  - a ACUTE VISIT  Chief Complaint  Patient presents with   Acute Visit    Pt c/o of chest tightness, productive cough with white/yellow mucus, and wheezing. Pt also c/o of feeling abdominal swelling/distention. Pt also c/o of constant fatigue. Pt states symptoms have been present x1 week.     Acute visit for this morbidly obese, rheumatoid arthritis patient with ILD who is now off CellCept  I saw her as recently as 02/09/2015. She reports that for the past 1 week she's having increased chest congestion, chest tightness, wheezing, shortness of breath, cough and yellow  sputum more than baseline. Symptoms are rated as moderate in severity. Relieved by inhaler. She has tried Mucinex for 1 week with some initial partial relief but symptoms progressed. So she made an acute visit.  She continues to stay off CellCept. She is not on prednisone 10 mg per day. She tells me [name] has held her prednisone at the dose because of concerns of temporal arteritis but apparently now that his diagnoses being eliminated in the differential diagnosis. She will see [name] in the next few weeks to decide on further  prednisone taper.    OV 04/16/2015  Chief Complaint  Patient presents with   Follow-up    Pt states she felt much better after completing the abx and pred. Pt states her SOB is at basline, prod cough with yellow mucus at times, pt c/o chest tightness.     Morbidly obese female with rheumatoid arthritis with interstitial lung disease UIP pattern with associated deconditioning and diastolic dysfunction  She continues to remain off CellCept. Last seen in October 2016. At that time I discharged her from the office with  doxycycline and prednisone taper. After that early November 2016 she was given another course of Doxy and prednisone burst. This did not help. Sputum culture at this time showed normal flora. She called again with the middle of November was given Levaquin and prednisone burst. She feels Levaquin helped her immensely. At this point in time she is reporting progressive dyspnea and also worsening arthritis. She wants to go back on immunomodulators again. She has discussed Imuran with [name]. She is due to see [name] tomorrow. She also feels that she is unable to come off prednisone. She feels at a minimum she needs 10 mg of prednisone per day. She will discuss this with Dr. D her rheumatologist tomorrow. She's also interested in a second opinion at [facility] interstitial lung disease clinic which I did discuss with her most recently. She continues to use oxygen.     OV 07/17/2015  Chief Complaint  Patient presents with   Follow-up    Pt states that she has improved. Pt c/o continued SOB with exertion, some ocugh and wheeze, and some intermittent chest tightness. Pt states that she does sleep a lot. Pt states that her heart rate remains high and that cardiology said it was due to her lungs. Pt is currently on Lasix, 4m, but still seems to retain a lot of fluid and is not seeming to go to the bathroom as frequently.    Follow-up interstitial lung disease secondary to rheumatoid arthritis in the setting of morbid obesity, poor quality of life and heavy chronic pain opioid use and depressive symptoms  Last seen November 2016. Since then she has seen [name] rheumatology clinic. She was given instructions on Imuran which she has red but she has not decided whether she should take it or not. She subsequently has followed up with [facility] interstitial lung disease clinic. CT scan of the chest reviewed result shows possible UIP pattern 06/12/2015. She had autoimmune profile which was strongly positive for both rheumatoid  factor and cyclic citrulline peptide both markers for rheumatoid arthritis. She did see Dr. CMargreta JourneyB on 07/02/2015. It appears that the note is not complete but according to the patient was supposed to be a multidisciplinary conference and they will get back to her. It is possible a second immunomodulators agent and is being contemplated but patient is leery of this given prior issues with opportunistic infection not otherwise specified and admissions to the hospital. In the interim she is also seen cardiology Janr 2017 Dr. BGwenlyn Foundwho did not feel dyspnea was cardiac related. I reviewed his note  OV 10/17/2015  Chief Complaint  Patient presents with   Follow-up    Pt reports she feels improved. Pt c/o continued SOB with exertion, some wheeze and occasional cough. Pt denies CP/tightness.     Follow-up interstitial lung disease secondary to rheumatoid arthritis in the setting of morbid obesity, poor quality of life and heavy chronic pain opioid use  and depressive symptoms   Since seeing me last in February 2017. She followed up at [facility] again pulmonary clinic in March 2017. I reviewed those notes. She saw Dr. Margreta Journey B on 07/31/2015. Due to the presence of bronchiectasis she's been started on  azithromycin 3 times a week. She says this has helped. In addition due to physical deconditioning and obesity she is getting home physical therapy is also help. She is interested in pulmonary rehabilitation although given obesity and back pain can be challenging. She nevertheless wants to try. She is on daily prednisone 10 mg a day. She met with [name] in the rheumatology clinic and has been referred now to [facility] rheumatology clinic for second opinion for a second immunomodulators. She continues to be morbidly obese and has been losing weight. She is somewhat interested in visiting with a [facility] for weight loss.   OV 02/18/2016  Chief Complaint  Patient presents with   Follow-up     Pt states she did not go to pulm rehab d/t BIL knee pain. Pt denies change in SOB, significant cough, CP/tightness and f/c/s. Pt states she is exercising at home. pt states she is doing well overall.    Problem List: 1.  Extensive bronchiectasis - cylindrical vs. Traction - has been present and not significantly changed from 2014 to 2017                       - has history of hospitalization for what sounds like pneumonia vs. Bronchiectasis flare as far back as 1999; underwent bronchoscopy which made her feel "like a new woman" in 1999 2.  Pulmonary fibrosis - basilar predominant reticular opacities that have not change from 2014 to 2017 3.  Rheumatoid arthritis: RF 959 ; CCP >300                       - has been treated with methotrexate, biologics, etc in the past - none in the past several years                       - currently on 7.5/92m alter day 4.  Morbid obesity with deconditioning - has had several hospitalizations in recent months; very deconditioned at present time  - [facility]     Followup for above  - [facility]. [facility] Review of the notes suggest that they have her on a slightly lower dose of prednisone with intent to taper to 5 mg per day. He also have her on hydroxychloroquine. No further disease modifying agents are being considered for rheumatoid arthritis. In terms of her interstitial lung disease and bronchiectasis she's on conservative therapy along with Activella device. This is helping and she feels better. Today she came in walking with her walker. This the happiest I have f seen her. She's not lost any weight but [facility]. She tried to go to pulmonary rehabilitation but could not because of knee pain. She is up-to-date with pneumonia vaccine. She's had a flu shot this  season.    OV 03/18/2016  Chief Complaint  Patient presents with   Acute Visit    Pt states breathing is not that bad today. Pt states she has cogestion build up, no tightness, SOB w/o excertion. Pt states breathing  has been better since last visit except the mucus. No cough feels the need to cough but can not.      follow-up bronchiectasis and immunosppessive thrapy. Visit 3 month olow-up. Body mssindex is stll very high but she says she has lost weight. Overal well. Last few days increasd symptoms of mucus, cough and subjective unwellness. No fever or colored sputum. But she feells she needs antibiotic and prednisone  OV 06/23/2016  Chief Complaint  Patient presents with   Follow-up    Pt states her SOB has been doing well but she has been more fatigued. Pt has an occ prod cough with light yellow mucus and chest tightness when SOB - resolves with rest.    Follow-up bronchiectasis with interstitial lung disease on immunosuppressive therapy due to rheumatoid arthritis. Last visit 01/17/2016. Since then she's lost 5 pounds of weight according to history. . She is here with her daughter. No acute bronchitis symptoms. Overall stable. The main issues that she's having increased joint pain and she is only on plaquenil. No fever or colored sputum.   OV 02/16/2017  Chief Complaint  Patient presents with   Follow-up    Pt states breathing has been okay. Stated she had a URI x1 month ago which she developed while in rehab after hip fx. Occ. SOB and occ. chest tightness. Denies any cough at this time.    Follow-up bronchiectasis with interstitial lung disease on immunosuppressive therapy due to rheumatoid arthritis. Last visit was 6 months ago approximately. Since then she has continued to lose weight through dietary control. She feels better. However in the summer 2018 she fell down due to orthostasis and fractured her left hip. This wasn't complicated by MRSA infection according to her history.  She is now undergoing physical therapy. Overall she's feeling well. She is very proud of her weight loss. Dyspnea is improved. She missed [facility] appointment for ILD clinic but she will make this again. She uses oxygen with heavy exertion at physical therapy and at night.  OV 02/11/2018  Subjective:  Patient ID: [name], female , DOB: Aug 09, 1940 , age 30 y.o. , MRN: [id number] , ADDRESS: [place] [id number]   02/11/2018 -   Chief Complaint  Patient presents with   Follow-up    Pt states she has been doing okay since last visit. States she has had some pain in the mid region of her chest x3 weeks and has also been having problems with fatigue. States she has had some occ coughing. Her main complaints are the pain in her chest and the exhaustion.     HPI Evann F Kirstein 82 y.o. -follow-up ILD with bronchiectasis in the setting of rheumatoid arthritis and obesity and physical deconditioning  It is almost a year since I last saw her.  Since then her daughter died in [place] with advanced sarcoma.  She is getting grief counseling from hospice.  She also has palliative care support at home.  She also in the last year has fallen and hurt her back.  She is on attending physical therapy which is helping her.  She says she is lost 30 pounds in the last 1 year.  Because of all this functional status is somewhat better but still she is extremely dyspneic.  In fact when she walked 185 feet x 3 laps on room air using a walker: She is only able to complete a lap and a half and stop because of extreme shortness of breath.  Her resting pulse ox is 98%.  Final pulse ox was 98%.  And her heart rate went from 107/min to 140/min.  She has not yet had a flu shot.  Of note she is on prednisone 15 mg/day which has helped her arthritis.  She is asking if it is okay to go back down to 10 mg/day from a lung standpoint.  She is also on a different arthritis immunomodulator which is helped her  rheumatoid arthritis pain.  She gets this treatment from [facility]ology.  Pulmonary ILD medications also being managed at [facility].      OV 02/10/2019  Subjective:  Patient ID: [name], female , DOB: [birth year] , age 13 y.o. , MRN: [id number] , ADDRESS: [address] [place] [id number] [name]. -follow-up ILD with bronchiectasis in the setting of rheumatoid arthritis and obesity and physical deconditioning  Problem List - copy and paste from [place] B: 1.  Extensive bronchiectasis - cylindrical vs. Traction - has been present and not significantly changed from 2014 to 2017                       - has history of hospitalization for what sounds like pneumonia vs. Bronchiectasis flare as far back as 1999; underwent bronchoscopy which made her feel "like a new woman" in 1999 2.  Pulmonary fibrosis - basilar predominant reticular opacities that have not change from 2014 to 2017 3.  Rheumatoid arthritis: RF 959 ; CCP >300                       - has been treated with methotrexate, biologics, etc in the past - none in the past several years                       - currently on 10 mg prednisone daily 4.  Morbid obesity with deconditioning - has had several hospitalizations in recent months; very deconditioned at present time   02/10/2019 -   Chief Complaint  Patient presents with   Follow-up    Pt here for ILD follow up. Pt states her breathing is unchanged since last OV in 01/2018. Pt denies new symptoms. Pt denies CP/tightness and f/s/c.      HPI [name] 82 y.o. -returns for follow-up.  I personally not seen her in 1 year.  In the interim she is kept up with follow-up at [facility].  In January 2020 she suffered from left lower extremity DVT after having shortness of breath.  She was admitted at [facility] and started on Eliquis.  She still continues with Eliquis.  She tells me that Morrie Sheldon was  considered as the cause although she does have obesity and sedentary lifestyle and venous incompetency.  It appears this was her first DVT.  She has had a CT angiogram chest in 2005 that was negative for PE and a Doppler ultrasound in 2016 and 2018 that was negative for DVT.  She says a cardiologist here locally is managing her Eliquis and plans to stop it after several months.  In terms of her rheumatoid arthritis she is now under the care of [place] rheumatology.  They just having her on prednisone.  In July 2020 she also saw Dr. Jacinto Reap pulmonary at [name].  Apparently a walk test could not be performed.  She says she has lost weight.  Her main  complaint is exertional dyspnea.  She wants to do portable oxygen.  She is only on nighttime oxygen.  In the past she always gets dyspneic way before she even desaturates a little bit this is on account of her obesity and deconditioning.  Her son is here with with her today.  This the first time I am meeting him.  He tells me that her breathing at night is rough although not as bad as his dad sleep apnea.  Patient is not on CPAP.  He is also curious about qualifying oxygen with walking.  Walking desaturation test for an 85 feet x 3 laps: She basically walked only half a lap.  Resting pulse ox was 97% with a heart rate of 102.  Final pulse ox was 96% with a heart rate of 122 and she stopped because of dyspnea.  She had a most recent CT chest there is resulted below and it is stable.   IMPRESSION: CT Chest high resolution 1. Mid and lower lung zone predominant bronchiectasis, peribronchovascular ground-glass and scattered subpleural reticular densities, grossly stable from 11/29/2014. Findings are nonspecific and can be seen with nonspecific interstitial pneumonitis. Usual interstitial pneumonitis is considered less likely but respiratory motion and expiratory phase imaging make assessment difficult. 2. Coronary artery calcification.     Electronically  Signed   By: Lorin Picket M.D.   On: 11/24/2018 13:04  ROS - per HPI  Results for GICELA, SCHWARTING" (MRN [id number]) as of 02/10/2019 14:47  Ref. Range 04/17/2015 18:02 04/18/2015 13:30  D-Dimer, America Brown Latest Ref Range: 0.00 - 0.50 ug/mL-FEU 2.57 (H) 2.54 (H)   OV 06/21/2019  Subjective:  Patient ID: [name], female , DOB: February 18, 1941 , age 48 y.o. , MRN: [id number] , ADDRESS: [place] [id number]   06/21/2019 -   Chief Complaint  Patient presents with   Follow-up    Pt states she was recently admitted to hospital at [facility] for 10 days. Since then, pt has been  having some low O2 sats and issues with SOB. Pt states she is better now than she was.   [name]. -follow-up ILD with bronchiectasis in the setting of rheumatoid arthritis and obesity and physical deconditioning  Problem List - copy and paste from [place] B: 1.  Extensive bronchiectasis - cylindrical vs. Traction - has been present and not significantly changed from 2014 to 2017                       - has history of hospitalization for what sounds like pneumonia vs. Bronchiectasis flare as far back as 1999; underwent bronchoscopy which made her feel "like a new woman" in 1999 2.  Pulmonary fibrosis - basilar predominant reticular opacities that have not change from 2014 to 2017 and in dec 2020 CT chest 3.  Rheumatoid arthritis: RF 959 ; CCP >300                       - has been treated with methotrexate, biologics, etc in the past - none in the past several years                       - currently on 10 mg prednisone daily 4.  Morbid obesity with deconditioning - has had several hospitalizations in recent months; very deconditioned at present time   HPI [name] 82 y.o. -admitted  approximately 2 months ago early December 2020 with Klebsiella pansensitive urinary tract infection and bilateral lower extremity cellulitis.  Discharge on 2 L nasal  cannula.  Recommendations for home physical therapy were made.  Bilateral Doppler extremities were negative in the hospital.  Her CODE STATUS is full.  She returns for routine follow-up.  Her daughter is with her.  She continues to lose weight.  She has lost around 40 to 50 pounds.  She says she is on the medication for rheumatoid arthritis I do not know which one.  She says her cellulitis was quite bad and she is suffered from it.  But nevertheless with the weight loss her hypoxemia and overall joint pain is better.  She still has significant pedal edema and erythema in her lower extremities.  She is asking for some advice with that.  Otherwise no issues.  Using 2 L nasal cannula at night.  Walks around with a walker.  Symptom score is listed b       11/22/2020- Interim hx  with NP Patient presents today for 2 week follow-up PNA. She had a CXR that showed slight increase in opacity right lung base, questionable infiltrate. Treated with Augmentin 1 tab BID x 10 days. She is feeling a great deal better. She no longer has right sided pain. She still has some wheezing and cough with yellow mucus. She is taking muciex 1,232m twice a day. She is using her flutter valve 3-4 times a day. She has been using nebulizer twice a day. She takes 154mprednisone daily. She is on Toprol XL 7519mshe has not taken this yet today. She takes Azithromycin MWF pulmonary with [facility]. She can resume this. Needs repeat CXR today.  TEST/EVENTS : High-resolution CT chest November 24, 2018 mid to lower lung bronchiectasis, groundglass reticular densities stable since 2016  [facility]\  Interval History:  Has been doing ok until about a month ago.  Had pneumonia on the right side a month ago. Even now, sometimes she feels that she has pain in her R chest - upper front chest. Even sometimes today, it feels kind of bad.   She was doing a lot of mucus production. They she started hurting badly on the right side.  Thought she was sleeping too much. Did not pay it any mind. Went to see the doctor and she told them about the pain. They did an xray and diagnosed with pneumonia. She did not have any fever or chills - but she says that she very seldom in her life has had temp >99. She took antibiotics. Follow up xray revealed clearing. She was put on prednisone 40 mg with taper. She still gets kind of hoarse and has mucus in her upper throat. She is scheduled now for surgery for cataracts on August 9th.   She has started getting joint pain back in her knees again.   She was out of azithromycin. She [facility] [facility]. Needs a 90 day supply.   Using Acapella. She has been using her albuterol nebulizer at home. She still has a lot of wheezing. This wheezing sometimes clears with coughing mucus, sometimes does not. Unsure if this gets better with the nebulizer. Happens more when she is laying down.   At the local pulmonologist, she needed oxygen and now has oxygen at home. Using [place] is using 2[place] with (sometimes 3L). This is with laying down.  When walking and moving, she keeps it on 2L.   Mucus production is getting better. She is using the acapella three or four times per day. She is using the nebulizer twice per day.   Has R chest discomfort which feels a bit worse today than prior. She says that it feels like a strain and it affects her voice. When she talks, it is like it is pulling or straining something.   Has help now every day at home. Son has been on her case about that.   Is on 10 mg prednisone - and has been on this dose for about a week.   Outside echo 06/04/2018: Study Conclusions   - Left ventricle: The cavity size was normal. There was mild focal    basal hypertrophy of the septum. Systolic function was normal.    The estimated ejection fraction was in the range of 60% to 65%.    Wall motion was normal; there were no regional wall motion     abnormalities.  - Right ventricle: The cavity size was mildly dilated. Wall    thickness was normal.  - Pulmonary arteries: Systolic pressure was mildly increased.   Echo pharm stress test with contrast 12/15/2018:  NORMAL STRESS TEST. NORMAL RESTING STUDY WITH NO WALL MOTION ABNORMALITIES AT REST AND PEAK STRESS. NO DOPPLER PERFORMED FOR VALVULAR REGURGITATION NO DOPPLER PERFORMED FOR VALVULAR STENOSIS NORMAL RESTING BP - APPROPRIATE RESPONSE  Cath done on 10/31/14:  Mild CAD without significant obstructive disease. Ectasia in all three  major proximal vessels.  Mild pulmonary hypertension. Aortic saturation 87%. Pulmonary artery  saturation 60%. Cardiac output 7.6 L. Cardiac index 3.3.  Continue medical therapy for lung disease. Continue preventative therapy  for her heart disease.Addendum by [name], MD on 10/31/2014 12:01 PM  Mild CAD without significant obstructive disease. Ectasia in all three  major proximal vessels.  Mild pulmonary hypertension. Aortic saturation 87%. Pulmonary artery  saturation 60%. Cardiac output 7.6 L. Cardiac index 3.3.  Current Functional Status:  Oxygen: uses 2-3L of oxygen at home PRN and with sleep.  Level of exertion that leads to dyspnea: Walking any distance makes her dyspneic.   Rehab: using the walker most of the time. When she goes out, she uses a wheelchair. Home PT was helpful. She had a problem getting in and out of vehicle and bed.  Laboratory Data/Test Results: Pulmonary Function Test (PFT) Latest Ref Rng & Units 07/02/2015 12/14/2017  FVC PRE L 1.78 1.58  FVC % PRE PRED % 63 57  FEV1 PRE L 1.49 1.33  FEV1 % PRE PRED % 70 65  FEV1/FVC PRE % 83.74 84.24  FEF25-75% PRE L/s 2.83 2.39  FEF25-75% % PRE PRED % 154 141  DLCO PRE ml/(min*mmHg) 11.28 11.41  DLCO % PRE PRED % 65 67   No PFTs today.  PFTs 12/14/17: FVC 1.58, 57 % predicted. FEV1 1.33, 65% predicted. FEV1/FVC 84.24. DLCO 11.41, 67% predicted. Compared to  06/2015, FVC is down 11.2%. DLCO is essentially stable.   Echocardiogram: 11/21/2015 - EF >55% with normal wall motion. Mildly enlarged RV with normal free wall. Mildly enlarged RA. Normal respiratory collapse of IVC. Peak RV pressure 41 mmHg.   EOTT today: Room air Spo2 96% at rest. HR at start of exercise was 122 / max HR was 150 during exercise = 106% predicted. Lowest SpO2 95% on room air. Walked 258 meters = 846 feet = 81% predicted. Total walk time = 5 minutes. Borg dyspnea scale =  7. Test ended early because of dyspnea, fatigue, and chest tightness.   Labs:  None today  Impression: Patient is a 82 y.o. with PMH significant for rheumatoid arthritis, bronchiectasis (with flare vs. Pneumonia in late 2016), pulmonary fibrosis (not progressed per CT from 2014 to 06/2015), morbid obesity, and deconditioning who comes to pulmonary clinic for follow up of lung disease and dyspnea. She has been maintained on an aggressive bronchiectasis regimen (Acapella, nebulizer, TIW azithromycin) and she has done well. Unfortunately, she ran out of azithromycin, and in the setting of this, she developed increased cough and sputum and was treated for pneumonia.   Today, she feels like she is recovered from the PNA, but she is not all the way back to her baseline. She has some congestion in her throat and she is concerned about pain in her chest (which is reproducible with palpation over the sternum.  She has limited mobility at home. She uses furniture / counters to get around if she is not using her walker. She does some physical therapy.   Plan - from patient instructions:  I have renewed your azithromycin - you have a year supply that will be sent to [facility] in [place], [place] [facility]). You should take this every Monday, Wednesday, Friday.   I am also going to write a prescription for a 1 week supply of DAILY azithromycin. So take it daily for 7 days, then switch to every Monday, Wednesday, Friday. The  prescription is written so that this can all be under a single prescription.   Continue to use your Acapella at least 3-4 times per day.   We are going to get another chest x ray today.   Regarding your nebulizer, we will renew albuterol order from [facility].   Plan to take your nebulizer three times per day (along with Acapella use). Once your congestion is improved, you may back off to twice per day.   Try to use your voltaren gel on your chest (nearly your sternum where it is sore) to see if this helps with your pain.   Return in about 6 months (around 06/12/2021).     OV 05/23/2021  Subjective:  Patient ID: [name], female , DOB: 01/02/1941 , age 70 y.o. , MRN: [id number] , ADDRESS: [place] [id number] PCP [name], MD Patient Care Team: [name], MD as PCP - General (Family Medicine) Lorretta Harp, MD as PCP - Cardiology (Cardiology) Duffy, Creola Corn, LCSW as Social Worker (Licensed Clinical Social Worker) Kidney, Nelida Meuse, Abel Presto, MD as Consulting Physician (Rheumatology) Efraim Kaufmann, NP (Rheumatology) Theodosia Blender, MD as Referring Physician (Pulmonary Disease)  This Provider for this visit: Treatment Team:  Attending Provider: Brand Males, MD    05/23/2021 -   Chief Complaint  Patient presents with   Follow-up    Pt states since last OV, she has been in and out of the hospital. States she also ended up with Covid and has had problems with a lot of coughing and phlegm build-up. Pt's O2 sats had been dropping and had to bump O2 up to 3L    [name]. -follow-up ILD with bronchiectasis in the setting of rheumatoid arthritis and obesity and physical deconditioning  Problem List - copy and paste from [place] B: 1.  Extensive bronchiectasis - cylindrical vs. Traction - has been present and not significantly changed from 2014 to 2017                       -  has history of  hospitalization for what sounds like pneumonia vs. Bronchiectasis flare as far back as 1999; underwent bronchoscopy which made her feel "like a new woman" in 1999 2.  Pulmonary fibrosis - basilar predominant reticular opacities that have not change from 2014 to 2017 and in dec 2020 CT chest 3.  Rheumatoid arthritis: RF 959 ; CCP >300                       - has been treated with methotrexate, biologics, etc in the past - none in the past several years                       - currently on 10 mg prednisone daily 4.  Morbid obesity with deconditioning - has had several hospitalizations in recent months; very deconditioned at present time  - intentl wt loss +  HPI [name] 82 y.o. -returns for follow-up.  I personally not seen her in 2 years almost.  In between she is followed up with nurse practitioner and also [facility].  She is here with her daughter to [place] who I am meeting for the first time.  They tell me that she has had 2 admissions for dehydration in 2022.  After that she went to [place] [facility] where in November 2022 she ended up with COVID and was given antiviral.  They were unhappy with the admission to [facility] rehab.  They do not want to go there again.  Apparently after that she is now needing 3 L of oxygen while previously she is only needing 2 L of nasal cannula oxygen.  Last CT scan of the chest echocardiogram 2020 including review of the medical records at [place] pulmonary function test with [place] was 2015 while at [facility] it was in 2019.  She continues to have functional disability.  She uses a walker to get around and change her clothes.  She continues to be immunosuppressed with prednisone, Plaquenil and also leflunomide for rheumatoid arthritis.  She has chronic pain and is on opioids \CT Chest data  No results found.   OV 07/12/2021  Subjective:  Patient ID: [name], female , DOB: 1940-11-07 , age 44 y.o. , MRN:  [id number] , ADDRESS: [place] [id number] PCP [name], MD Patient Care Team: [name], MD as PCP - General (Family Medicine) Lorretta Harp, MD as PCP - Cardiology (Cardiology) Duffy, Creola Corn, LCSW as Social Worker (Licensed Clinical Social Worker) Kidney, Nelida Meuse, Abel Presto, MD as Consulting Physician (Rheumatology) Efraim Kaufmann, NP (Rheumatology) Theodosia Blender, MD as Referring Physician (Pulmonary Disease)  This Provider for this visit: Treatment Team:  Attending Provider: Brand Males, MD    07/12/2021 -   Chief Complaint  Patient presents with   Follow-up    PFT performed today.   Pt states her breathing is about the same since last visit. States she is no longer coughing up as much mucus compared to last visit.    Problem List - copy and paste from [place] B: 1.  Extensive bronchiectasis - cylindrical vs. Traction - has been present and not significantly changed from 2014 to 2017                       - has history of hospitalization for what sounds like pneumonia vs. Bronchiectasis flare as far back  as 1999; underwent bronchoscopy which made her feel "like a new woman" in 1999 2.  Pulmonary fibrosis - basilar predominant reticular opacities that have not change from 2014 to 2017 and in dec 2020 CT chest 3.  Rheumatoid arthritis: RF 959 ; CCP >300                       - has been treated with methotrexate, biologics, etc in the past - none in the past several years                       - currently on 10 mg prednisone daily 4.  Morbid obesity with deconditioning - has had several hospitalizations in recent months; very deconditioned at present time  - intentl wt loss +  5. COvid - Nov 2022 associated with hospitalizations for dehydration   HPI Jeda F Hinchey 82 y.o. -returns for follow-up.  She presents with her daughter [name].  She is here to review results.  She does say that after her  COVID in 07 April 2021 her dyspnea is worse and then also oxygen uses up to 3 L.  Compared to the last visit she is stable but definitely compared to a few years ago she is more short of breath and requiring more oxygen.  She also reports that since November 2022 her right fifth finger has more lateral deviation.  In her fourth finger seems to be in a fixed abnormality.  She did try a short course prednisone with her primary care physician and it does not help.  She has rheumatology appointment pending with Dr. Clovis Riley at [facility] next month.  Her test results show that her echo is essentially normal.  No comment about pulmonary hypertension.  Her pulmonary function test shows FVC declined compared to 2019 at [facility] and also 2015 with [place].  This correlates with increased shortness of breath over the last few years increased subjective oxygen need.  She did have a high-resolution CT scan of the chest that our radiologist feels there is progression and she has UIP pattern with emerging honeycombing.  I personally visualized various CT scans of the chest.  I am not so sure about progression but I did tell her that I would put this up for multidisciplinary case conference.  We discussed care for progressive interstitial lung disease in the setting of rheumatoid arthritis.  Did discuss about registry protocol and I gave her the consent form.  She is interested in this.  Also discussed about nintedanib antifibrotic.  Did discuss about diarrhea as a side effect and GI issues.  She says she is constipated she and her daughter feel with polypharmacy that she should easily be able to handle this 1 other medication.  They are interested in this.  They are willing to wait till multidisciplinary case conference is done.  We also discussed the fact with WHO group 3 pulmonary hypertension inhaled treprostinil as an option.  She is never had a right heart cath.  I have written to Dr. Quay Burow her  cardiologist to consider right heart catheterization.  We will regroup in a couple of months.    MARCH 2023 ILD condrerence with Dr Burt Ek  "Geneva Dea Bitting DOB: [birth year] MRN:  [id number] Female, 82 y.o." [name] "   RA with ILD . Stable for years but then covid nov 2022 Rx as outpaitent. Since then o2 needs 2 -> 3L /PFT - slightly worse  FVC ? technique issue becase could not do dlco. CT reported as UIP and worse but MR not sure.   Question: is CT really worse? If so, she qualifies for registry study and ofev. "    "HRCT: 06/19/2021 HRCT: 11/24/2018 HRCT: 11/29/2014 HRCT: 07/20/2014 HRCT: 10/05/2013 HRCT: 04/08/2013" No lung biopsy    Concordant Between MDD and official read  On laest scan there is ILD and Tracheobroncholamacia and also diffuse Traction bronchiectasis without ILD. In LL there is bialteral LL HC., subpleural disease UIP that is unusual - suggestive of CTD.     Can approach as ILD     xxxxxxxxxxxxxxxx                   [facility] 08/12/21  HEMODYNAMICS:    1: Right atrial pressure-20/18 2: Right ventricular pressure-37/17, mean 22 3: Pulmonary artery pressure-43/19, mean 30 4: Pulmonary wedge pressure-A-wave 23, V wave 20, mean 22 - does not meet tyvaso criteria 5: Cardiac output-4.97 L/min with an index of 2.43 L/min/m 6: PVR-1.81 -0 does not meet tyvaso cirtiera     IMPRESSION: Ms. Abreu has moderately elevated right atrial pressures and mildly elevated pulmonary artery pressures.  Her PA saturations were 58%.  The sheath was removed and a bandage was placed over the right antecubital puncture site.  The patient left the lab in stable condition.  She will be discharged home later this morning and to follow-up with [name] and myself.   Quay Burow. MD, [facility] 08/12/2021 8:06   OV 09/13/2021  Subjective:  Patient ID: [name], female , DOB: May 29, 1940 , age 89 y.o. , MRN: [id number] , ADDRESS: [place]  [id number] PCP [name], MD Patient Care Team: [name], MD as PCP - General (Family Medicine) Lorretta Harp, MD as PCP - Cardiology (Cardiology) Duffy, Creola Corn, LCSW as Social Worker (Licensed Clinical Social Worker) Kidney, Nelida Meuse, Abel Presto, MD as Consulting Physician (Rheumatology) Efraim Kaufmann, NP (Rheumatology) Theodosia Blender, MD as Referring Physician (Pulmonary Disease)  This Provider for this visit: Treatment Team:  Attending Provider: Brand Males, MD    09/13/2021 -   Chief Complaint  Patient presents with   Follow-up    Pt states she has been doing okay since last visit. States her breathing is about the same.   Problem List - copy and paste from [place] B: 1.  Extensive bronchiectasis - cylindrical vs. Traction - has been present and not significantly changed from 2014 to 2017                       - has history of hospitalization for what sounds like pneumonia vs. Bronchiectasis flare as far back as 1999; underwent bronchoscopy which made her feel "like a new woman" in 1999 2.  Pulmonary fibrosis - basilar predominant reticular opacities that have not change from 2014 to 2017 and in dec 2020 CT chest 3.  Rheumatoid arthritis: RF 959 ; CCP >300                       - has been treated with methotrexate, biologics, etc in the past - none in the past several years                       - currently on 10 mg prednisone daily 4.  Morbid obesity with deconditioning - has had several hospitalizations  in recent months; very deconditioned at present time  - intentl wt loss +  5. COvid - Nov 2022 associated with hospitalizations for dehydratio  HPI [name] 82 y.o. -returns for follow-up.  After the last visit we discussed the multidisciplinary case conference.  Independent radiologist concurred there was UIP and she has had slow progression.  She had right heart catheterization and there is no pulmonary  hypertension to qualify her for inhaled treprostinil based on WHO group 3.  Overall she is better from a RA perspective.  She is doing physical therapy.  No new complaints.  She is still following a strict diet she is lost some more weight.  We discussed nintedanib as an antifibrotic.  She wants to do this.  She has constipation.  Did discuss that sometimes she could have diverticulitis and abdominal . Which is very rare but have seen 1 or 2 patients in the setting of opioids do this especially with nintedanib.  She is accepting of the risk.  Discussed rare possibilities of bleeding and MI.  She is understanding.  She wants to start the medication.    OV 02/04/2022  Subjective:  Patient ID: [name], female , DOB: 1940/07/04 , age 60 y.o. , MRN: [id number] , ADDRESS: [place] [address] PCP [name], MD Patient Care Team: [name], MD as PCP - General (Family Medicine) Lorretta Harp, MD as PCP - Cardiology (Cardiology) Duffy, Creola Corn, LCSW as Social Worker (Licensed Clinical Social Worker) [place], [facility] Bo Merino, MD as Consulting Physician (Rheumatology) Efraim Kaufmann, NP (Rheumatology) Theodosia Blender, MD as Referring Physician (Pulmonary Disease)  This Provider for this visit: Treatment Team:  Attending Provider: Brand Males, MD    02/04/2022 -   Chief Complaint  Patient presents with   Follow-up    Follow up after PFT. Pt fell on Sunday and she fell a week before that as well.     HPI [name] 82 y.o. -returns for follow-up.  She says that few days ago she had slurred speech noticed by the family members well on the telephone and later when she went to the bathroom she fell patient sustained some injuries.  She went to ER all the imaging work-up as listed below.  Everything was reassuring.  The suspecting a TIA and she has been referred to a neurologist.  Her blood work 02/02/2022  was fairly unremarkable.  She has been on nintedanib since I last saw her she is only in the low-dose 100 mg twice daily because of her polypharmacy and overall poor functional status but even with that she is having significant diarrhea and fatigue.  All her symptom scores are worse.  The son also feels that her overall general wellbeing is decline in the last few weeks.  She definitely attributes the diarrhea to the nintedanib.  She is not sure about the fatigue but she believes it could be from the nintedanib.  There is no GI discomfort other than diarrhea.    CT Chest data - HRT FEb 2023  Narrative & Impression  CLINICAL DATA:  82 year old female with history of chest tightness and shortness of breath. Prior history of COVID infection in December 2022.   EXAM: CT CHEST WITHOUT CONTRAST   TECHNIQUE: Multidetector CT imaging of the chest was performed following the standard protocol without intravenous contrast. High resolution imaging of the lungs, as well as inspiratory and expiratory imaging, was performed.  RADIATION DOSE REDUCTION: This exam was performed according to the departmental dose-optimization program which includes automated exposure control, adjustment of the mA and/or kV according to patient size and/or use of iterative reconstruction technique.   COMPARISON:  Chest CT 01/16/2021.   FINDINGS: Cardiovascular: Heart size is normal. There is no significant pericardial fluid, thickening or pericardial calcification. There is aortic atherosclerosis, as well as atherosclerosis of the great vessels of the mediastinum and the coronary arteries, including calcified atherosclerotic plaque in the left anterior descending coronary artery.   Mediastinum/Nodes: No pathologically enlarged mediastinal or hilar lymph nodes. Esophagus is unremarkable in appearance. No axillary lymphadenopathy.   Lungs/Pleura: High-resolution images demonstrate some patchy areas of  ground-glass attenuation, septal thickening, subpleural reticulation, mild cylindrical traction bronchiectasis, peripheral bronchiolectasis and a few areas of apparent developing honeycombing. These findings have a definitive craniocaudal gradient and appear minimally progressive compared to the recent prior examination. Inspiratory and expiratory imaging demonstrates some mild air trapping indicative of mild small airways disease. No acute consolidative airspace disease. No pleural effusions. No definite suspicious appearing pulmonary nodules or masses are noted.   Upper Abdomen: Unremarkable.   Musculoskeletal: There are no aggressive appearing lytic or blastic lesions noted in the visualized portions of the skeleton.   IMPRESSION: 1. The appearance of the lungs is considered diagnostic of usual interstitial pneumonia (UIP) per current ATS guidelines, demonstrating mild progression compared to prior examinations. 2. Aortic atherosclerosis, in addition to left anterior descending coronary artery disease. Please note that although the presence of coronary artery calcium documents the presence of coronary artery disease, the severity of this disease and any potential stenosis cannot be assessed on this non-gated CT examination. Assessment for potential risk factor modification, dietary therapy or pharmacologic therapy may be warranted, if clinically indicated.   Aortic Atherosclerosis (ICD10-I70.0).     Electronically Signed   By: Vinnie Langton M.D.   On: 06/21/2021 07:48    OV 04/01/2022  Subjective:  Patient ID: [name], female , DOB: May 21, 1940 , age 5 y.o. , MRN: [id number] , ADDRESS: [place] [address] PCP Melvenia Beam, MD Patient Care Team: Melvenia Beam, MD as PCP - General (Neurology) Lorretta Harp, MD as PCP - Cardiology (Cardiology) Duffy, Creola Corn, LCSW as Social Worker (Licensed Clinical Social Worker) [place],  [place] Bo Merino, MD as Consulting Physician (Rheumatology) Efraim Kaufmann, NP (Rheumatology) Theodosia Blender, MD as Referring Physician (Pulmonary Disease)  This Provider for this visit: Treatment Team:  Attending Provider: Brand Males, MD    04/01/2022 -   Chief Complaint  Patient presents with   Follow-up    Pt states she has been doing okay since last visit.     HPI [name] 82 y.o. -after seeing me mid September 2023 we stopped nintedanib because of severe diarrhea.  The diarrhea resolved pretty much within a few days.  After that she was doing okay but was having intermittent nausea.  1 such episode was severe on 03/01/2022 approximately 3 weeks after stopping nintedanib] she ended up in the ER.  Was found to have: Adherent to the bladder and the ovary.  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  This was the sigmoid colon.  She also has intermittent left lower quadrant pain.  She was seen by Tye Savoy nurse practitioner 03/11/2022.  A colonoscopy has been scheduled.  03/21/2022 cardiology clearance was obtained.    Otherwise, from a shortness of breath perspective she is stable.  There is no changes here  From a fall perspective she is seen neurology 03/06/2022.  She saw Dr. Lavell Anchors.  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  Also multifocal peripheral predominant bilateral chronic microhemorrhage may indicate cerebral amyloid angiopathy.  Mini-Mental scale was 22/30 Aricept is being started.Today she is here with her daughter Roanna Epley.  She was able to understand everything I was saying and giving a good history.  She gave all the details about her neurology visit GI visit all accurately.  Also the ER visit.  CT Chest data  OV 05/27/2022  Subjective:  Patient ID: [name], female , DOB: [birth year] , age 9 y.o. , MRN: [id number] , ADDRESS: [place] [address] PCP [name],  MD Patient Care Team: [name], MD as PCP - General (Family Medicine) Lorretta Harp, MD as PCP - Cardiology (Cardiology) Duffy, Creola Corn, LCSW as Social Worker (Licensed Clinical Social Worker) [place], [place] Bo Merino, MD as Consulting Physician (Rheumatology) Efraim Kaufmann, NP (Rheumatology) Theodosia Blender, MD as Referring Physician (Pulmonary Disease)  This Provider for this visit: Treatment Team:  Attending Provider: Brand Males, MD   Problem List - copy and paste from [place] B: 1.  Extensive bronchiectasis - cylindrical vs. Traction - has been present and not significantly changed from 2014 to 2017                       - has history of hospitalization for what sounds like pneumonia vs. Bronchiectasis flare as far back as 1999; underwent bronchoscopy which made her feel "like a new woman" in 1999 2.  Pulmonary fibrosis - basilar predominant reticular opacities that have not change from 2014 to 2017 and in dec 2020 CT chest 3.  Rheumatoid arthritis: RF 959 ; CCP >300                       - has been treated with methotrexate, biologics, etc in the past - none in the past several years                       - currently on 10 mg prednisone daily    -Started nintedanib?  Approximately May 2023 but stopped September 2023 because of significant diarrhea   -Joint ILD-Pro registry 04/01/2022 3b.  Normal right heart catheterization March 2023 4.  Morbid obesity with deconditioning - has had several hospitalizations in recent months; very deconditioned at present time  - intentl wt loss +  5. COvid - Nov 2022 associated with hospitalizations for dehydratin  05/27/2022 -   Chief Complaint  Patient presents with   Follow-up    Some SOB and pain on right breast area.  Not every day.     HPI [name] 82 y.o. -presents for follow-up with her daughter.  Patient is giving most of the history but daughter is  corroborating certain key parts.  Since her last visit she visited Dr. Almyra Free freed in the interstitial lung disease clinic at [facility].  Her assessment was to hold off on pirfenidone given patient's current GI issue and very slow progression only.  I concur with this.  I reviewed her note.  That was the main question for this visit.  From a ILD perspective she continues to be stable compared to last visit.  There is no pulmonary function test this visit but  symptom wise she is stable.  Her main issues are GI issues.  She has seen Dr. Ralene Ok at [facility] surgery in [place] in December 2023.  I reviewed his note as well.  He has recommended very conservative line of management with MiraLAX.  The belief is that opioids with chronic constipation caused the diverticulitis.  We did do some vaccine counseling.  She has had a flu shot but she is yet to have her RSV vaccine and also shingles vaccine.  We discussed about this.  She has had a COVID vaccines     SYMPTOM SCALE - ILD 06/21/2018 Birth weight not on file Last Weight  Most recent update: 06/21/2019 11:53 AM    Weight  113.3 kg (249 lb 12.8 oz)             05/23/2021 224# (used to 300# some yearsaago) 07/12/2021 224# 02/04/2022 Ofev  04/01/2022 Off ofev   O2 use 2L nithg 3L 3L  Ra rest.  Nighttime oxygen and daytime as needed   Shortness of Breath 0 -> 5 scale with 5 being worst (score 6 If unable to do)       At rest _0 0   Simple tasks - showers, clothes change, eating, shaving _1 Household (dishes, doing bed, laundry) _2 Shopping _3 Walking level at own pace _4 Walking up Stairs 6  x 0 5   Total (30-36) Dyspnea Score _5 How bad is your cough? _6 How bad is your fatigue 0 0 _7 How bad is nausea 0 0 0 0 0 to 4   How bad is vomiting?  0 0 0 0 0   How bad is diarrhea? 0 0 0 5 0   How bad is anxiety? _8 How bad is depression _9 PFT     Latest Ref Rng & Units 02/04/2022    3:06 PM 07/12/2021    2:29 PM 01/12/2014   12:59 PM 09/12/2013   10:02 AM 04/28/2013   10:07 AM  PFT Results  FVC-Pre L 1.47  1.29  1.59  1.78  1.73   FVC-Predicted Pre % 62  70  76  77  74   FVC-Post L   1.46  1.54  1.19   FVC-Predicted Post %   70  66  51   Pre FEV1/FVC % % 91  92  91  91  84   Post FEV1/FCV % %   93  89  97   FEV1-Pre L 1.34  1.18  1.45  1.63  1.46   FEV1-Predicted Pre % 76  83  90  90  81   FEV1-Post L   1.36  1.36  1.15   DLCO uncorrected ml/min/mmHg 11.92  -0.03  16.69  12.50    DLCO UNC% % 67  0  77  51    DLCO corrected ml/min/mmHg 11.89  -0.03      DLCO COR %Predicted % 67  0      DLVA Predicted % 95  29  95  91    TLC L   3.55  3.57  2.94   TLC % Predicted %   74  70  58   RV % Predicted %   62  66  62        has a past medical history of Acute on chronic diastolic (congestive) heart failure (HCC), Anemia, Anxiety, Arthritis, Asthma, Carotid artery occlusion, Chronic fatigue, Chronic kidney disease, Closed left hip fracture (HCC), Clotting disorder ([place]), Contusion of left knee, COPD (chronic obstructive pulmonary disease) ([place]), Depression, reactive, Diabetes mellitus, Diastolic dysfunction, Difficulty in walking, Family history of heart disease, Generalized muscle weakness, Gout, High cholesterol, History of falling, Hypertension, Hypothyroidism, Interstitial lung disease ([place]), 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   reports that she has never smoked. She has never used smokeless tobacco.  Past Surgical History:  Procedure Laterality Date   BRAIN SURGERY     Gamma knife 10/13. Needs repeat spring  '14   CARDIAC  CATHETERIZATION N/A 10/31/2014   Procedure: Right/Left Heart Cath and Coronary Angiography;  Surgeon: [name], MD;  Location: [place] [facility];  Service: Cardiovascular;  Laterality: N/A;   COLONOSCOPY WITH PROPOFOL N/A 04/14/2022   Procedure: COLONOSCOPY WITH PROPOFOL;  Surgeon: Jerene Bears, MD;  Location: [facility];  Service: Gastroenterology;  Laterality: N/A;   CYST EXCISION     2 on face   CYST REMOVAL NECK     ESOPHAGOGASTRODUODENOSCOPY (EGD) WITH PROPOFOL N/A 09/14/2014   Procedure: ESOPHAGOGASTRODUODENOSCOPY (EGD) WITH PROPOFOL;  Surgeon: [name], MD;  Location: [facility];  Service: Endoscopy;  Laterality: N/A;   INCISION AND DRAINAGE HIP Left 01/16/2017   Procedure: IRRIGATION AND DEBRIDEMENT LEFT HIP;  Surgeon: [name], MD;  Location: [facility];  Service: Orthopedics;  Laterality: Left;   INTRAMEDULLARY (IM) NAIL INTERTROCHANTERIC Left 11/29/2016   Procedure: INTRAMEDULLARY (IM) NAIL INTERTROCHANTRIC;  Surgeon: [name], MD;  Location: [place];  Service: Orthopedics;  Laterality: Left;   OVARY SURGERY     RIGHT HEART CATH N/A 08/12/2021   Procedure: RIGHT HEART CATH;  Surgeon: Lorretta Harp, MD;  Location: [place] [facility];  Service: Cardiovascular;  Laterality: N/A;   SHOULDER SURGERY Left    TONSILLECTOMY  age 28   VAGINAL HYSTERECTOMY     VIDEO BRONCHOSCOPY Bilateral 05/31/2013   Procedure: VIDEO BRONCHOSCOPY WITHOUT FLUORO;  Surgeon: Brand Males, MD;  Location: [place];  Service: Cardiopulmonary;  Laterality: Bilateral;   video bronscoscopy  05/19/1998   lung    Allergies  Allergen Reactions   Codeine Swelling and Other (See Comments)    Facial swelling Chest pain and swelling in legs     Infliximab Anaphylaxis    ([place]) "sent me into shock"    Lisinopril Swelling    Face and neck swelling    Ofev [Nintedanib] Diarrhea    Immunization History  Administered Date(s) Administered   Fluad  Quad(high Dose 65+) 02/02/2019, 03/11/2021, 04/01/2022   Influenza Split 01/29/2012, 03/30/2013, 02/10/2014   Influenza, High Dose Seasonal PF 02/16/2017, 02/11/2018, 01/31/2019, 02/27/2020   Influenza,inj,Quad PF,6+ Mos 02/09/2015, 12/18/2015   Influenza-Unspecified 02/09/2015, 12/18/2015   PFIZER Comirnaty(Gray Top)Covid-19 Tri-Sucrose Vaccine 07/24/2019, 08/14/2019, 02/27/2020   PFIZER(Purple Top)SARS-COV-2 Vaccination 07/17/2019, 08/14/2019   PPD Test 04/23/2015   Pneumococcal Conjugate-13 08/21/2014   Pneumococcal Polysaccharide-23 02/17/2012   Tdap 12/27/2015    Family History  Problem Relation Age of Onset   Diabetes Mother  Heart attack Mother    Hypertension Father    Lung cancer Father    Diabetes Sister    Breast cancer Sister    Breast cancer Sister    Diabetes Brother    Hypertension Brother    Heart disease Brother    Heart attack Brother    Kidney cancer Brother    Gout Brother    Kidney failure Brother        x 5   Esophageal cancer Brother    Stomach cancer Brother    Prostate cancer Brother    Uterine cancer Daughter        with mets   Fibroids Daughter    Hypertension Daughter    Hypertension Daughter    Hypertension Son    Heart murmur Son    Rheum arthritis Maternal Uncle    Alzheimer's disease Neg Hx    Dementia Neg Hx      Current Outpatient Medications:    acetaminophen (TYLENOL) 500 MG tablet, Take 500-1,000 mg by mouth every 6 (six) hours as needed for moderate pain., Disp: , Rfl:    albuterol (PROVENTIL) (2.5 MG/3ML) 0.083% nebulizer solution, Take 3 mLs (2.5 mg total) by nebulization every 6 (six) hours as needed for wheezing., Disp: 75 mL, Rfl: 0   albuterol (VENTOLIN HFA) 108 (90 Base) MCG/ACT inhaler, Inhale 2 puffs into the lungs every 4 (four) hours as needed for wheezing or shortness of breath., Disp: , Rfl:    allopurinol (ZYLOPRIM) 300 MG tablet, Take 1 tablet (300 mg total) by mouth daily., Disp: 30 tablet, Rfl: 0   amLODipine  (NORVASC) 10 MG tablet, Take 10 mg by mouth daily., Disp: , Rfl:    apixaban (ELIQUIS) 5 MG TABS tablet, Take 1 tablet (5 mg total) by mouth 2 (two) times daily., Disp: 60 tablet, Rfl: 5   Artificial Tear Ointment (DRY EYES OP), Place 1 drop into both eyes 2 (two) times daily as needed (dry eyes)., Disp: , Rfl:    atorvastatin (LIPITOR) 20 MG tablet, Take 0.5 tablets (10 mg total) by mouth every evening., Disp: 15 tablet, Rfl: 0   azithromycin (ZITHROMAX) 250 MG tablet, Take 250 mg by mouth 3 (three) times a week., Disp: , Rfl:    Calcium Carb-Cholecalciferol (CALCIUM + D3 PO), Take 2 tablets by mouth daily with lunch. , Disp: , Rfl:    Cholecalciferol (VITAMIN D-3) 25 MCG (1000 UT) CAPS, Take 2 capsules (2,000 Units total) by mouth daily. GIVE ONE TAB PO Q DAY, Disp: 60 capsule, Rfl: 0   colchicine 0.6 MG tablet, Take 1 tablet (0.6 mg total) by mouth daily as needed (gout flares)., Disp: 30 tablet, Rfl: 0   cyclobenzaprine (FLEXERIL) 5 MG tablet, Take 1 tablet (5 mg total) by mouth at bedtime as needed for muscle spasms., Disp: 90 tablet, Rfl: 1   diclofenac Sodium (VOLTAREN) 1 % GEL, Apply 2 g topically 3 (three) times daily. Apply to both hands and knees (Patient taking differently: Apply 2 g topically 3 (three) times daily as needed (pain). Apply to both hands and knees), Disp: 100 g, Rfl: 1   donepezil (ARICEPT) 5 MG tablet, Take 1 tablet (5 mg total) by mouth at bedtime., Disp: 90 tablet, Rfl: 3   Dulaglutide (TRULICITY) 0.10 UV/2.5DG SOPN, Inject 0.75 mg into the skin once a week. (Patient taking differently: Inject 0.75 mg into the skin every Tuesday.), Disp: 6 mL, Rfl: 2   DULoxetine (CYMBALTA) 60 MG capsule, Take 1 capsule (60 mg  total) by mouth daily., Disp: 90 capsule, Rfl: 1   fluticasone (FLONASE) 50 MCG/ACT nasal spray, Place 2 sprays into both nostrils daily., Disp: 16 g, Rfl: 3   furosemide (LASIX) 80 MG tablet, Take 1 tablet (80 mg total) by mouth daily., Disp: 30 tablet, Rfl: 11    gabapentin (NEURONTIN) 600 MG tablet, Take 2 tablets (1,200 mg total) by mouth 3 (three) times daily., Disp: 180 tablet, Rfl: 1   guaiFENesin (MUCINEX) 600 MG 12 hr tablet, Take 2 tablets (1,200 mg total) by mouth 2 (two) times daily., Disp: 120 tablet, Rfl: 0   hydrALAZINE (APRESOLINE) 25 MG tablet, Take 1 tablet (25 mg total) by mouth 2 (two) times daily., Disp: 180 tablet, Rfl: 1   hydroxychloroquine (PLAQUENIL) 200 MG tablet, Take 1 tablet (200 mg total) by mouth daily., Disp: 30 tablet, Rfl: 0   leflunomide (ARAVA) 20 MG tablet, Take 1 tablet (20 mg total) by mouth daily., Disp: 30 tablet, Rfl: 0   levothyroxine (SYNTHROID) 50 MCG tablet, Take 1 tablet (50 mcg total) by mouth daily., Disp: 90 tablet, Rfl: 3   linaclotide (LINZESS) 72 MCG capsule, Take 1 capsule (72 mcg total) by mouth daily before breakfast., Disp: 30 capsule, Rfl: 3   metolazone (ZAROXOLYN) 2.5 MG tablet, TAKE 1 TABLET BY MOUTH DAILY AS NEEDED FOR 2 DAYS FOR WEIGHT ABOVE 255 POUNDS, Disp: 30 tablet, Rfl: 0   metoprolol succinate (TOPROL-XL) 100 MG 24 hr tablet, Take 1 tablet by mouth once daily along with 1 metoprolol 22m tablet, Disp: 90 tablet, Rfl: 1   metoprolol succinate (TOPROL-XL) 25 MG 24 hr tablet, Take 5 tablets (125 mg total) by mouth daily. (Patient taking differently: Take 25 mg by mouth daily.), Disp: 150 tablet, Rfl: 3   morphine (MS CONTIN) 15 MG 12 hr tablet, Take 1 tablet (15 mg total) by mouth every 12 (twelve) hours., Disp: 60 tablet, Rfl: 0   morphine (MSIR) 15 MG tablet, Take 1 tablet (15 mg total) by mouth 2 (two) times daily., Disp: 60 tablet, Rfl: 0   Multiple Vitamin (MULTIVITAMIN WITH MINERALS) TABS, Take 1 tablet by mouth daily. , Disp: , Rfl:    olmesartan (BENICAR) 40 MG tablet, Take 1 tablet (40 mg total) by mouth daily., Disp: 90 tablet, Rfl: 3   ondansetron (ZOFRAN-ODT) 4 MG disintegrating tablet, Take 1 tablet (4 mg total) by mouth every 8 (eight) hours as needed for nausea or vomiting., Disp:  14 tablet, Rfl: 1   OXYGEN, Inhale 3 L into the lungs See admin instructions. 3 L at bedtime, and 3 L during the day as needed for exertion, Disp: , Rfl:    pantoprazole (PROTONIX) 40 MG tablet, take 1 tablet by mouth once daily MINUTES BEFORE 1ST MEAL OF THE DAY, Disp: 90 tablet, Rfl: 3   Potassium Chloride ER 20 MEQ TBCR, Take 20 mEq by mouth daily., Disp: 90 tablet, Rfl: 1   predniSONE (DELTASONE) 10 MG tablet, Take 1 tablet (10 mg total) by mouth daily with breakfast., Disp: 90 tablet, Rfl: 1      Objective:   Vitals:   05/27/22 1125  BP: 124/72  Pulse: 94  Temp: 98.3 F (36.8 C)  TempSrc: Oral  SpO2: 94%  Weight: 223 lb 3.2 oz (101.2 kg)  Height: _0  (1.626 m)    Estimated body mass index is 38.31 kg/m as calculated from the following:   Height as of this encounter: _1  (1.626 m).   Weight as of this encounter: 223  lb 3.2 oz (101.2 kg).  _0 @  Filed Weights   05/27/22 1125  Weight: 223 lb 3.2 oz (101.2 kg)     Physical Exam   General: No distress.  Obese lady sitting in the wheelchair Neuro: Alert and Oriented x 3. GCS 15. Speech normal Psych: Pleasant Resp:  Barrel Chest - no.  Wheeze - no, Crackles - YES, No overt respiratory distress CVS: Normal heart sounds. Murmurs - no Ext: Stigmata of Connective Tissue Disease - RA HEENT: Normal upper airway. PEERL +. No post nasal drip        Assessment:       ICD-10-CM   1. ILD (interstitial lung disease) ([place])  J84.9     2. Interstitial lung disease due to connective tissue disease ([place])  J84.89    M35.9     3. Immunosuppression due to drug therapy [facility])  N40.768    G88.110          Plan:     Patient Instructions     ICD-10-CM   1. Interstitial lung disease due to connective tissue disease ([place])  J84.89    M35.9     2. History of rheumatoid arthritis  Z87.39        ILD slowly getting worse over years but stable sinc last visit in June 2023 . -> sept 2023 Severe diarrhea due to  nintedanib even at the low-dose - resolved sep 2023 ater stopping ofev But  you have new issue of concerns for diverticulitis mid oct 2023 and seen by surgeon dec 2023  - you are on miralax for this     Plan  - No more  nintedanib ever -No antifibrotic till  GI issues sorted out ad disease progresses further = RSV Vaccine on your own recommended again  - Please talk to PCP [name], MD -  and ensure you get  shingrix (GSK) inactivated vaccine against shingles - spirometry and dlco in 6 months   Followup  - 6 mnths with [name]   = 30 min visit; symptoms score at followup  (Level 04: Estb 30-39 min n   visit spent in total care time and counseling or/and coordination of care by this undersigned MD - Dr Brand Males. This includes one or more of the following on this same day 05/27/2022: pre-charting, chart review, note writing, documentation discussion of test results, diagnostic or treatment recommendations, prognosis, risks and benefits of management options, instructions, education, compliance or risk-factor reduction. It excludes time spent by the [place] or office staff in the care of the patient . Actual time is 35 min)   SIGNATURE    Dr. Brand Males, M.D., F.C.C.P,  Pulmonary and Critical Care Medicine Staff Physician, [place] Director - Interstitial Lung Disease  Program  Pulmonary [place] at [place], [place], [address]  Pager: [phone], If no answer or between  15:00h - 7:00h: call [phone] Telephone: [phone]  11:51 AM 05/27/2022

## 2022-05-26 NOTE — Patient Instructions (Signed)
ICD-10-CM   1. Interstitial lung disease due to connective tissue disease ([place])  J84.89    M35.9     2. History of rheumatoid arthritis  Z87.39        ILD slowly getting worse over years but stable sinc last visit in June 2023 . -> sept 2023 Severe diarrhea due to nintedanib even at the low-dose - resolved sep 2023 ater stopping ofev But  you have new issue of concerns for diverticulitis mid oct 2023 and seen by surgeon dec 2023  - you are on miralax for this     Plan  - No more  nintedanib ever -No antifibrotic till  GI issues sorted out ad disease progresses further = RSV Vaccine on your own recommended again  - Please talk to PCP [name], MD -  and ensure you get  shingrix (GSK) inactivated vaccine against shingles - spirometry and dlco in 6 months   Followup  - 6 mnths with [name]   = 30 min visit; symptoms score at followup

## 2022-05-27 ENCOUNTER — Encounter: Payer: Self-pay | Admitting: Internal Medicine

## 2022-05-27 ENCOUNTER — Telehealth: Payer: Self-pay

## 2022-05-27 ENCOUNTER — Encounter: Payer: Self-pay | Admitting: *Deleted

## 2022-05-27 ENCOUNTER — Telehealth: Payer: Self-pay | Admitting: *Deleted

## 2022-05-27 ENCOUNTER — Ambulatory Visit (INDEPENDENT_AMBULATORY_CARE_PROVIDER_SITE_OTHER): Payer: Medicare Other | Admitting: Internal Medicine

## 2022-05-27 VITALS — BP 124/72 | HR 94 | Temp 98.3°F | Ht 64.0 in | Wt 223.2 lb

## 2022-05-27 DIAGNOSIS — D84821 Immunodeficiency due to drugs: Secondary | ICD-10-CM

## 2022-05-27 DIAGNOSIS — Z79899 Other long term (current) drug therapy: Secondary | ICD-10-CM

## 2022-05-27 DIAGNOSIS — I10 Essential (primary) hypertension: Secondary | ICD-10-CM

## 2022-05-27 DIAGNOSIS — M359 Systemic involvement of connective tissue, unspecified: Secondary | ICD-10-CM

## 2022-05-27 DIAGNOSIS — J849 Interstitial pulmonary disease, unspecified: Secondary | ICD-10-CM

## 2022-05-27 DIAGNOSIS — J8489 Other specified interstitial pulmonary diseases: Secondary | ICD-10-CM | POA: Diagnosis not present

## 2022-05-27 DIAGNOSIS — Z7185 Encounter for immunization safety counseling: Secondary | ICD-10-CM

## 2022-05-27 NOTE — Patient Outreach (Signed)
  Care Coordination   Initial Visit Note   05/27/2022 Name: [name] MRN: [id number] DOB: [birth year]  [name] is a 82 y.o. year old female who sees [name], MD for primary care. I spoke with  [name] by phone today.  What matters to the patients health and wellness today?  Medication reaction with Trulicity and transportation resource    Goals Addressed               This Visit's Progress     COMPLETED: Medication issue and transportation resource (pt-stated)        Care Coordination Interventions: Advised patient to follow up with her provider's office concerning her medications issues for possible appointment if needed however RN will also outreach to the pt's provider concerning her medication issues as noted. Provided education to patient and/or caregiver about advanced directives Reviewed medications with patient and discussed adherence with medications with no needed refills however discussed issues with Trulicity (burning upon administration at the site). Collaborated with Dr. Volanda Napoleon regarding medications reaction via in-basket communication. Provided patient and/or caregiver with education information about scats and other transportation methods Advice worker) Reviewed scheduled/upcoming provider appointments including sufficient transportation source at this time from friends and family however other resources offered as a back up for appointments Care Guide referral for transportation resources  Assessed social determinant of health barriers Educated on care management services related to social workers, Radio broadcast assistant and pharmacy. Other services for care-guide resources also introduced for community resources if needed.  No other needs presented at this time however pt provided information on how to reach this RN care manager for any future needs.         SDOH assessments and interventions completed:  Yes  SDOH Interventions  Today    Flowsheet Row Most Recent Value  SDOH Interventions   Housing Interventions Intervention Not Indicated  Transportation Interventions Other (Comment), SCAT (Specialized Community [employer])  [referral to careguide for SCATs application as a back up source for transportation]  Utilities Interventions Intervention Not Indicated        Care Coordination Interventions:  Yes, provided   Follow up plan: No further intervention required.   Encounter Outcome:  Pt. Visit Completed   Raina Mina, RN Care Management Coordinator [place] Office [phone]

## 2022-05-27 NOTE — Telephone Encounter (Signed)
Telephone encounter was:  Unsuccessful.  05/27/2022 Name: [name] MRN: [id number] DOB: 08-04-1940  Unsuccessful outbound call made today to assist with:  Transportation Needs   Outreach Attempt:  1st Attempt  A HIPAA compliant voice message was left requesting a return call.  Instructed patient to call back at [phone].  [place] Resource Care Guide   [email or web address]  ?? [phone]   Website: [email or web address]  [email or web address]

## 2022-05-27 NOTE — Patient Instructions (Signed)
Visit Information  Thank you for taking time to visit with me today. Please don't hesitate to contact me if I can be of assistance to you.   Following are the goals we discussed today:   Goals Addressed               This Visit's Progress     COMPLETED: Medication issue and transportation resource (pt-stated)        Care Coordination Interventions: Advised patient to follow up with her provider's office concerning her medications issues for possible appointment if needed however RN will also outreach to the pt's provider concerning her medication issues as noted. Provided education to patient and/or caregiver about advanced directives Reviewed medications with patient and discussed adherence with medications with no needed refills however discussed issues with Trulicity (burning upon administration at the site). Collaborated with Dr. Volanda Napoleon regarding medications reaction via in-basket communication. Provided patient and/or caregiver with education information about scats and other transportation methods Advice worker) Reviewed scheduled/upcoming provider appointments including sufficient transportation source at this time from friends and family however other resources offered as a back up for appointments Care Guide referral for transportation resources  Assessed social determinant of health barriers Educated on care management services related to social workers, Radio broadcast assistant and pharmacy. Other services for care-guide resources also introduced for community resources if needed.  No other needs presented at this time however pt provided information on how to reach this RN care manager for any future needs.         Please call the care guide team at [phone] if you need to cancel or reschedule your appointment.   If you are experiencing a Mental Health or [place] or need someone to talk to, please call the Suicide and Crisis Lifeline: 988  Patient  verbalizes understanding of instructions and care plan provided today and agrees to view in [place]. Active MyChart status and patient understanding of how to access instructions and care plan via MyChart confirmed with patient.     No further follow up required: No additional follow up needs  Raina Mina, RN Care Management Coordinator [place] Office [phone]

## 2022-05-28 ENCOUNTER — Other Ambulatory Visit: Payer: Self-pay | Admitting: Cardiovascular Disease

## 2022-05-28 ENCOUNTER — Telehealth: Payer: Self-pay

## 2022-05-28 DIAGNOSIS — I10 Essential (primary) hypertension: Secondary | ICD-10-CM

## 2022-05-28 NOTE — Telephone Encounter (Signed)
   Telephone encounter was:  Successful.  05/28/2022 Name: [name] MRN: [id number] DOB: 09-Apr-1941  [name] is a 82 y.o. year old female who is a primary care patient of [name], MD . The community resource team was consulted for assistance with Transportation Needs   Care guide performed the following interventions: Spoke with patient verified home address to send SCAT application. Letter saved in Epic.  Follow Up Plan:  No further follow up planned at this time. The patient has been provided with needed resources.  [place] Resource Care Guide   [email or web address]  ?? [phone]   Website: [email or web address]  [email or web address]

## 2022-06-02 ENCOUNTER — Encounter: Payer: Medicare Other | Attending: Physical Medicine & Rehabilitation | Admitting: Registered Nurse

## 2022-06-02 ENCOUNTER — Encounter: Payer: Self-pay | Admitting: Registered Nurse

## 2022-06-02 VITALS — BP 149/86 | HR 96 | Ht 64.0 in

## 2022-06-02 DIAGNOSIS — M7062 Trochanteric bursitis, left hip: Secondary | ICD-10-CM | POA: Diagnosis not present

## 2022-06-02 DIAGNOSIS — Z5181 Encounter for therapeutic drug level monitoring: Secondary | ICD-10-CM | POA: Diagnosis not present

## 2022-06-02 DIAGNOSIS — M25511 Pain in right shoulder: Secondary | ICD-10-CM | POA: Diagnosis not present

## 2022-06-02 DIAGNOSIS — M2550 Pain in unspecified joint: Secondary | ICD-10-CM | POA: Insufficient documentation

## 2022-06-02 DIAGNOSIS — M170 Bilateral primary osteoarthritis of knee: Secondary | ICD-10-CM | POA: Diagnosis not present

## 2022-06-02 DIAGNOSIS — G894 Chronic pain syndrome: Secondary | ICD-10-CM | POA: Diagnosis not present

## 2022-06-02 DIAGNOSIS — M546 Pain in thoracic spine: Secondary | ICD-10-CM | POA: Insufficient documentation

## 2022-06-02 DIAGNOSIS — Z79891 Long term (current) use of opiate analgesic: Secondary | ICD-10-CM | POA: Insufficient documentation

## 2022-06-02 DIAGNOSIS — G8929 Other chronic pain: Secondary | ICD-10-CM | POA: Diagnosis not present

## 2022-06-02 DIAGNOSIS — M5416 Radiculopathy, lumbar region: Secondary | ICD-10-CM | POA: Insufficient documentation

## 2022-06-02 DIAGNOSIS — M7061 Trochanteric bursitis, right hip: Secondary | ICD-10-CM | POA: Insufficient documentation

## 2022-06-02 MED ORDER — MORPHINE SULFATE ER 15 MG PO TBCR: 15.0000 mg | EXTENDED_RELEASE_TABLET | Freq: Two times a day (BID) | ORAL | 0 refills | Status: DC

## 2022-06-02 MED ORDER — MORPHINE SULFATE 15 MG PO TABS: 15.0000 mg | ORAL_TABLET | Freq: Two times a day (BID) | ORAL | 0 refills | Status: DC

## 2022-06-02 NOTE — Progress Notes (Signed)
Subjective:    Patient ID: [name], female    DOB: 11/01/1940, 82 y.o.   MRN: [id number]  HPI: [name] is a 82 y.o. female who returns for follow up appointment for chronic pain and medication refill. She states her pain is located in her right shoulder, mid- lower back pain radiating into her right lower extremities, bilateral hip pain R>L and bilateral knee pain. Also reports generalized joint pain. She rates her pain 6. Her current exercise regime is walking in her home with walker, she arrived in wheelchair.    [name] Morphine equivalent is 60.00 MME.  Last oral swab was performed on 03/31/2022, it was consistent.    Pain Inventory Average Pain 7 Pain Right Now 6 My pain is sharp, burning, stabbing, and aching  In the last 24 hours, has pain interfered with the following? General activity 7 Relation with others 7 Enjoyment of life 7 What TIME of day is your pain at its worst? morning  Sleep (in general) Fair  Pain is worse with: walking, bending, sitting, standing, and some activites Pain improves with: heat/ice and pacing activities Relief from Meds: 7  Family History  Problem Relation Age of Onset   Diabetes Mother    Heart attack Mother    Hypertension Father    Lung cancer Father    Diabetes Sister    Breast cancer Sister    Breast cancer Sister    Diabetes Brother    Hypertension Brother    Heart disease Brother    Heart attack Brother    Kidney cancer Brother    Gout Brother    Kidney failure Brother        x 5   Esophageal cancer Brother    Stomach cancer Brother    Prostate cancer Brother    Uterine cancer Daughter        with mets   Fibroids Daughter    Hypertension Daughter    Hypertension Daughter    Hypertension Son    Heart murmur Son    Rheum arthritis Maternal Uncle    Alzheimer's disease Neg Hx    Dementia Neg Hx    Social History   Socioeconomic History   Marital status: Married    Spouse name: Not on file    Number of children: 6   Years of education: college   Highest education level: Not on file  Occupational History   Occupation: retired Marine scientist  Tobacco Use   Smoking status: Never   Smokeless tobacco: Never  Vaping Use   Vaping Use: Never used  Substance and Sexual Activity   Alcohol use: No   Drug use: No   Sexual activity: Not Currently  Other Topics Concern   Not on file  Social History Narrative   Patient consumes 2-3 cups coffee per day.   Social Determinants of Health   Financial Resource Strain: Low Risk  (10/23/2021)   Overall Financial Resource Strain (CARDIA)    Difficulty of Paying Living Expenses: Not hard at all  Food Insecurity: No Food Insecurity (05/27/2022)   Hunger Vital Sign    Worried About Running Out of Food in the Last Year: Never true    Ran Out of Food in the Last Year: Never true  Transportation Needs: Unmet Transportation Needs (05/28/2022)   PRAPARE - Hydrologist (Medical): Yes    Lack of Transportation (Non-Medical): No  Physical Activity: Insufficiently Active (10/23/2021)   Exercise Vital  Sign    Days of Exercise per Week: 2 days    Minutes of Exercise per Session: 40 min  Stress: No Stress Concern Present (10/18/2020)   [place]    Feeling of Stress : Not at all  Social Connections: [place] (10/23/2021)   Social Connection and Isolation Panel [NHANES]    Frequency of Communication with Friends and Family: More than three times a week    Frequency of Social Gatherings with Friends and Family: More than three times a week    Attends Religious Services: More than 4 times per year    Active Member of [employer] or Organizations: Yes    Attends Archivist Meetings: More than 4 times per year    Marital Status: Married   Past Surgical History:  Procedure Laterality Date   BRAIN SURGERY     Gamma knife 10/13. Needs repeat spring  '14    CARDIAC CATHETERIZATION N/A 10/31/2014   Procedure: Right/Left Heart Cath and Coronary Angiography;  Surgeon: [name], MD;  Location: [facility] [facility];  Service: Cardiovascular;  Laterality: N/A;   COLONOSCOPY WITH PROPOFOL N/A 04/14/2022   Procedure: COLONOSCOPY WITH PROPOFOL;  Surgeon: Jerene Bears, MD;  Location: [facility];  Service: Gastroenterology;  Laterality: N/A;   CYST EXCISION     2 on face   CYST REMOVAL NECK     ESOPHAGOGASTRODUODENOSCOPY (EGD) WITH PROPOFOL N/A 09/14/2014   Procedure: ESOPHAGOGASTRODUODENOSCOPY (EGD) WITH PROPOFOL;  Surgeon: [name], MD;  [facility]: [facility];  Service: Endoscopy;  Laterality: N/A;   INCISION AND DRAINAGE HIP Left 01/16/2017   Procedure: IRRIGATION AND DEBRIDEMENT LEFT HIP;  Surgeon: [name], MD;  Location: [facility];  Service: Orthopedics;  Laterality: Left;   INTRAMEDULLARY (IM) NAIL INTERTROCHANTERIC Left 11/29/2016   Procedure: INTRAMEDULLARY (IM) NAIL INTERTROCHANTRIC;  Surgeon: [name], MD;  Location: [place];  Service: Orthopedics;  Laterality: Left;   OVARY SURGERY     RIGHT HEART CATH N/A 08/12/2021   Procedure: RIGHT HEART CATH;  Surgeon: Lorretta Harp, MD;  Location: [place] [facility];  Service: Cardiovascular;  Laterality: N/A;   SHOULDER SURGERY Left    TONSILLECTOMY  age 45   VAGINAL HYSTERECTOMY     VIDEO BRONCHOSCOPY Bilateral 05/31/2013   Procedure: VIDEO BRONCHOSCOPY WITHOUT FLUORO;  Surgeon: Brand Males, MD;  Location: [place];  Service: Cardiopulmonary;  Laterality: Bilateral;   video bronscoscopy  05/19/1998   lung   Past Surgical History:  Procedure Laterality Date   BRAIN SURGERY     Gamma knife 10/13. Needs repeat spring  '14   CARDIAC CATHETERIZATION N/A 10/31/2014   Procedure: Right/Left Heart Cath and Coronary Angiography;  Surgeon: [name], MD;  Location: [place] [facility];  Service: Cardiovascular;  Laterality: N/A;   COLONOSCOPY  WITH PROPOFOL N/A 04/14/2022   Procedure: COLONOSCOPY WITH PROPOFOL;  Surgeon: Jerene Bears, MD;  [facility];  Service: Gastroenterology;  Laterality: N/A;   CYST EXCISION     2 on face   CYST REMOVAL NECK     ESOPHAGOGASTRODUODENOSCOPY (EGD) WITH PROPOFOL N/A 09/14/2014   Procedure: ESOPHAGOGASTRODUODENOSCOPY (EGD) WITH PROPOFOL;  Surgeon: [name], MD;  [facility];  Service: Endoscopy;  Laterality: N/A;   INCISION AND DRAINAGE HIP Left 01/16/2017   Procedure: IRRIGATION AND DEBRIDEMENT LEFT HIP;  Surgeon: [name], MD;  [facility];  Service: Orthopedics;  Laterality: Left;  INTRAMEDULLARY (IM) NAIL INTERTROCHANTERIC Left 11/29/2016   Procedure: INTRAMEDULLARY (IM) NAIL INTERTROCHANTRIC;  Surgeon: [name], MD;  Location: [place];  Service: Orthopedics;  Laterality: Left;   OVARY SURGERY     RIGHT HEART CATH N/A 08/12/2021   Procedure: RIGHT HEART CATH;  Surgeon: Lorretta Harp, MD;  Location: [place] [facility];  Service: Cardiovascular;  Laterality: N/A;   SHOULDER SURGERY Left    TONSILLECTOMY  age 78   VAGINAL HYSTERECTOMY     VIDEO BRONCHOSCOPY Bilateral 05/31/2013   Procedure: VIDEO BRONCHOSCOPY WITHOUT FLUORO;  Surgeon: Brand Males, MD;  Location: [place];  Service: Cardiopulmonary;  Laterality: Bilateral;   video bronscoscopy  05/19/1998   lung   Past Medical History:  Diagnosis Date   Acute on chronic diastolic (congestive) heart failure (HCC)    Anemia    iron deficiency anemia - secondary to blood loss ( chronic)    Anxiety    Arthritis    endstage changes bilateral knees/bilateral ankles.    Asthma    Carotid artery occlusion    Chronic fatigue    Chronic kidney disease    Closed left hip fracture (HCC)    Clotting disorder ([place])    pt denies this   Contusion of left knee    due to fall 1/14.   COPD (chronic obstructive pulmonary disease) (HCC)    pulmonary fibrosis   Depression,  reactive    Diabetes mellitus    type II    Diastolic dysfunction    Difficulty in walking    Family history of heart disease    Generalized muscle weakness    Gout    High cholesterol    History of falling    Hypertension    Hypothyroidism    Interstitial lung disease ([place])    Meningioma of left sphenoid wing involving cavernous sinus ([place]) 02/17/2012   Continue diplopia, left eye pain and left headaches.     Morbid obesity ([place])    Neuromuscular disorder ([place])    diabetic neuropathy    Normal coronary arteries    cardiac catheterization performed  10/31/14   Pneumonia    RA (rheumatoid arthritis) ([place])    has been off methotreaxte since 10/13.   Spinal stenosis of lumbar region    Thyroid disease    Unspecified lack of coordination    URI (upper respiratory infection)    BP (!) 149/86   Pulse 96   Ht _0  (1.626 m)   SpO2 92%   BMI 38.31 kg/m   Opioid Risk Score:   Fall Risk Score:  `1  Depression screen [facility] 2/9     06/02/2022   10:12 AM 02/10/2022   11:39 AM 12/20/2021    3:26 PM 12/09/2021    1:12 PM 12/02/2021   11:22 AM 10/23/2021   11:52 AM 09/25/2021    3:21 PM  Depression screen PHQ 2/9  Decreased Interest 0 _1 0 0 1  Down, Depressed, Hopeless 0 _2 0 0 1  PHQ - 2 Score 0 _3 0 0 2  Altered sleeping   1 1  0 0  Tired, decreased energy  _4 0 2  Change in appetite  0 1 1  0 1  Feeling bad or failure about yourself   0 2 2  0 1  Trouble concentrating  0 2 1  0 1  Moving slowly or fidgety/restless  0 2 2  0  2  Suicidal thoughts  0 0 1  0 0  PHQ-9 Score   13 14  0 9  Difficult doing work/chores  Very difficult Very difficult Extremely dIfficult         Review of Systems  Musculoskeletal:  Positive for back pain and gait problem.  All other systems reviewed and are negative.     Objective:   Physical Exam Vitals and nursing note reviewed.  Constitutional:      Appearance: Normal appearance.  Cardiovascular:     Rate and Rhythm: Normal  rate and regular rhythm.     Pulses: Normal pulses.     Heart sounds: Normal heart sounds.  Pulmonary:     Effort: Pulmonary effort is normal.     Breath sounds: Normal breath sounds.  Musculoskeletal:     Cervical back: Normal range of motion and neck supple.     Comments: Normal Muscle Bulk and Muscle Testing Reveals:  Upper Extremities: Decreased ROM  45 Degrees and Muscle Strength 5/5 Bilateral AC Joint Tenderness Thoracic and Lumbar Hypersensitivity Bilateral Greater Trochanter Tenderness Lower Extremities: Decreased ROM and muscle Strength 5/5 Bilateral Lower Extremities Flexion Produces Pain into her Bilateral [place] Arrived in wheelchair     Skin:    General: Skin is warm and dry.  Neurological:     Mental Status: She is alert and oriented to person, place, and time.  Psychiatric:        Mood and Affect: Mood normal.        Behavior: Behavior normal.         Assessment & Plan:  1. Lumbar spinal stenosis with neurogenic claudication. Associated facet arthropathy/ Lumbar Radiculitis: Continue current medication regime with Gabapentin and HEP as tolerated. 06/02/2022 Refilled: MS Contin 15 mg one tablet every 12 hours #60 and  MSIR 15 one tablet twice a day as needed  For break through pain ( 6 am and 1:00 pm)  #60.since adverse effect with insomnia with Nucynta. Nucynta discontinued due to insomnia.  We will continue the opioid monitoring program, this consists of regular clinic visits, examinations, urine drug screen, pill counts as well as use of [place] Controlled Substance Reporting system. A 12 month History has been reviewed on the [place] Controlled Substance Reporting System 06/02/2022 2. Chronic Bilateral Thoracic Back Pain: Continue HEP as Tolerated. Continue to Monitor. 06/02/2022 3 Depression: Continue current medication Regime: Cymbalta . Continue to monitor.0115/2024 4. Diabetes mellitus type 2 with polyneuropathy: Continue current medication  regime: Gabapentin. 06/02/2022 5. Rheumatoid arthritis and osteoarthritis. Continue current medication Regime.  Voltaren Gel. Rheumatology Following. 06/02/2022 6. Interstitial lung disease: Pulmonology Following. 06/02/2022. 7. Bilateral Osteoarthritis Knee's:  Continue current medication regimen. Voltaren Gel. 06/02/2022. 8. Muscle Spasm: Continue current medication regime : Flexeril. Continue to monitor.06/02/2022 9. Bilateral Greater Trochanteric Tenderness: L>R. Continue current treatment with Ice/Heat Therapy. 06/02/2022. 10. Polyarthralgia: Rheumatology Following: Continue to monitor.06/02/2022 11. Morbid Obesitity: Continue Healthy Diet Regime and HEP. Continue to monitor.  06/02/2022 12. Cervicalgia/Cervical Radiculitis: No complaints today. Continue to Alternate with Heat and Ice Therapy. Continue to monitor. 06/02/2022    F/U in 2 months

## 2022-06-26 ENCOUNTER — Ambulatory Visit (INDEPENDENT_AMBULATORY_CARE_PROVIDER_SITE_OTHER): Payer: Medicare Other | Admitting: Gastroenterology

## 2022-06-26 ENCOUNTER — Encounter: Payer: Self-pay | Admitting: Gastroenterology

## 2022-06-26 ENCOUNTER — Other Ambulatory Visit (INDEPENDENT_AMBULATORY_CARE_PROVIDER_SITE_OTHER): Payer: Medicare Other

## 2022-06-26 VITALS — BP 120/60 | HR 89 | Ht 64.0 in | Wt 223.0 lb

## 2022-06-26 DIAGNOSIS — R110 Nausea: Secondary | ICD-10-CM

## 2022-06-26 DIAGNOSIS — R252 Cramp and spasm: Secondary | ICD-10-CM | POA: Diagnosis not present

## 2022-06-26 DIAGNOSIS — K219 Gastro-esophageal reflux disease without esophagitis: Secondary | ICD-10-CM

## 2022-06-26 DIAGNOSIS — Z8719 Personal history of other diseases of the digestive system: Secondary | ICD-10-CM

## 2022-06-26 DIAGNOSIS — E876 Hypokalemia: Secondary | ICD-10-CM

## 2022-06-26 LAB — BASIC METABOLIC PANEL
BUN: 22 mg/dL (ref 6–23)
CO2: 36 mEq/L — ABNORMAL HIGH (ref 19–32)
Calcium: 8.7 mg/dL (ref 8.4–10.5)
Chloride: 100 mEq/L (ref 96–112)
Creatinine, Ser: 0.91 mg/dL (ref 0.40–1.20)
GFR: 59.55 mL/min — ABNORMAL LOW (ref >60.00)
Glucose, Bld: 139 mg/dL — ABNORMAL HIGH (ref 70–99)
Potassium: 3.7 mEq/L (ref 3.5–5.1)
Sodium: 145 mEq/L (ref 135–145)

## 2022-06-26 MED ORDER — ONDANSETRON 4 MG PO TBDP: 4.0000 mg | ORAL_TABLET | Freq: Three times a day (TID) | ORAL | 1 refills | Status: DC | PRN

## 2022-06-26 MED ORDER — PANTOPRAZOLE SODIUM 40 MG PO TBEC: 40.0000 mg | DELAYED_RELEASE_TABLET | Freq: Two times a day (BID) | ORAL | 3 refills | Status: DC

## 2022-06-26 NOTE — Patient Instructions (Addendum)
We have sent the following medications to your pharmacy for you to pick up at your convenience: Zofran , Pantoprazole   Increase your Pantoprazole to twice daily.   Follow up as needed.   Your provider has requested that you go to the basement level for lab work before leaving today. Press "B" on the elevator. The lab is located at the first door on the left as you exit the elevator.  _______________________________________________________  If your blood pressure at your visit was 140/90 or greater, please contact your primary care physician to follow up on this.  _______________________________________________________  If you are age 65 or older, your body mass index should be between 23-30. Your Body mass index is 38.28 kg/m. If this is out of the aforementioned range listed, please consider follow up with your Primary Care Provider.  If you are age 55 or younger, your body mass index should be between 19-25. Your Body mass index is 38.28 kg/m. If this is out of the aformentioned range listed, please consider follow up with your Primary Care Provider.   ________________________________________________________  The [facility] providers would like to encourage you to use [facility] to communicate with providers for non-urgent requests or questions.  Due to long hold times on the telephone, sending your provider a message by [facility] may be a faster and more efficient way to get a response.  Please allow 48 business hours for a response.  Please remember that this is for non-urgent requests.  _______________________________________________________  Thank you for choosing me and [facility].  Dr. Silverio Decamp

## 2022-06-26 NOTE — Progress Notes (Signed)
[name]    PU:[id number]    1940-06-08  Primary Care Physician:Banks, Langley Adie, MD  Referring Physician: [name], MD [name],  [place] [id number]   Chief complaint: GERD, nausea  HPI: 82 year old very pleasant female here for follow-up visit for chronic GERD   She underwent colonoscopy on April 14, 2022 for findings of abnormal CT concerning for possible colovesicular fistula by Dr. Hilarie Fredrickson  She has benign-appearing moderate stenosis in the mid sigmoid colon appeared to be secondary to diverticular disease, has multiple left-sided diverticula.  8 mm sessile polyp noted in the ascending colon, was not removed an additional 3 mm polyp noted in the descending colon not removed.  She is experiencing increased heartburn, dyspepsia and nausea.  She is using Zofran as needed with improvement.  She is taking pantoprazole 40 mg daily.  Denies any vomiting or severe abdominal pain.  She has intermittent generalized abdominal bloating and discomfort.  Denies any blood in stool or melena.  CT abd & pelvis with contrast 03/01/22 1. No acute findings in the abdomen or pelvis. Specifically, no findings to explain the patient's history of abdominal pain. 2. Sigmoid colon is tethered to the left ovary and the dome of the urinary bladder. There is bladder wall thickening focally in the region where it is tethered to the sigmoid colon. Imaging features raise concern for colovesical fistula. 3. 2 mm nonobstructing interpolar right renal stone. 4. Tiny hiatal hernia. 5. Cylindrical bronchiectasis with parenchymal scarring in both lung bases. 6.  Aortic Atherosclerosis (ICD10-I70.0).     Outpatient Encounter Medications as of 06/26/2022  Medication Sig   acetaminophen (TYLENOL) 500 MG tablet Take 500-1,000 mg by mouth every 6 (six) hours as needed for moderate pain.   albuterol (PROVENTIL) (2.5 MG/3ML) 0.083% nebulizer solution Take 3 mLs (2.5 mg  total) by nebulization every 6 (six) hours as needed for wheezing.   albuterol (VENTOLIN HFA) 108 (90 Base) MCG/ACT inhaler Inhale 2 puffs into the lungs every 4 (four) hours as needed for wheezing or shortness of breath.   allopurinol (ZYLOPRIM) 300 MG tablet Take 1 tablet (300 mg total) by mouth daily.   amLODipine (NORVASC) 10 MG tablet Take 10 mg by mouth daily.   apixaban (ELIQUIS) 5 MG TABS tablet Take 1 tablet (5 mg total) by mouth 2 (two) times daily.   Artificial Tear Ointment (DRY EYES OP) Place 1 drop into both eyes 2 (two) times daily as needed (dry eyes).   atorvastatin (LIPITOR) 20 MG tablet Take 0.5 tablets (10 mg total) by mouth every evening.   azithromycin (ZITHROMAX) 250 MG tablet Take 250 mg by mouth 3 (three) times a week.   Calcium Carb-Cholecalciferol (CALCIUM + D3 PO) Take 2 tablets by mouth daily with lunch.    Cholecalciferol (VITAMIN D-3) 25 MCG (1000 UT) CAPS Take 2 capsules (2,000 Units total) by mouth daily. GIVE ONE TAB PO Q DAY   colchicine 0.6 MG tablet Take 1 tablet (0.6 mg total) by mouth daily as needed (gout flares).   cyclobenzaprine (FLEXERIL) 5 MG tablet Take 1 tablet (5 mg total) by mouth at bedtime as needed for muscle spasms.   diclofenac Sodium (VOLTAREN) 1 % GEL Apply 2 g topically 3 (three) times daily. Apply to both hands and knees (Patient taking differently: Apply 2 g topically 3 (three) times daily as needed (pain). Apply to both hands and knees)   donepezil (ARICEPT) 5 MG  tablet Take 1 tablet (5 mg total) by mouth at bedtime.   Dulaglutide (TRULICITY) [id number] [id number] SOPN Inject 0.75 mg into the skin once a week. (Patient taking differently: Inject 0.75 mg into the skin every Tuesday.)   DULoxetine (CYMBALTA) 60 MG capsule Take 1 capsule (60 mg total) by mouth daily.   fluticasone (FLONASE) 50 MCG/ACT nasal spray Place 2 sprays into both nostrils daily.   furosemide (LASIX) 80 MG tablet Take 1 tablet (80 mg total) by mouth daily.   gabapentin  (NEURONTIN) 600 MG tablet Take 2 tablets (1,200 mg total) by mouth 3 (three) times daily.   guaiFENesin (MUCINEX) 600 MG 12 hr tablet Take 2 tablets (1,200 mg total) by mouth 2 (two) times daily.   hydrALAZINE (APRESOLINE) 25 MG tablet Take 1 tablet (25 mg total) by mouth 2 (two) times daily.   hydroxychloroquine (PLAQUENIL) 200 MG tablet Take 1 tablet (200 mg total) by mouth daily.   leflunomide (ARAVA) 20 MG tablet Take 1 tablet (20 mg total) by mouth daily.   levothyroxine (SYNTHROID) 50 MCG tablet Take 1 tablet (50 mcg total) by mouth daily.   linaclotide (LINZESS) 72 MCG capsule Take 1 capsule (72 mcg total) by mouth daily before breakfast.   metolazone (ZAROXOLYN) 2.5 MG tablet TAKE 1 TABLET BY MOUTH DAILY AS NEEDED FOR 2 DAYS FOR WEIGHT ABOVE 255 POUNDS   metoprolol succinate (TOPROL-XL) 100 MG 24 hr tablet TAKE 1 TABLET BY MOUTH DAILY ALONG WITH 25 MG TABLET   metoprolol succinate (TOPROL-XL) 25 MG 24 hr tablet Take 5 tablets (125 mg total) by mouth daily. (Patient taking differently: Take 25 mg by mouth daily.)   morphine (MS CONTIN) 15 MG 12 hr tablet Take 1 tablet (15 mg total) by mouth every 12 (twelve) hours.   morphine (MSIR) 15 MG tablet Take 1 tablet (15 mg total) by mouth 2 (two) times daily.   Multiple Vitamin (MULTIVITAMIN WITH MINERALS) TABS Take 1 tablet by mouth daily.    olmesartan (BENICAR) 40 MG tablet Take 1 tablet (40 mg total) by mouth daily.   ondansetron (ZOFRAN-ODT) 4 MG disintegrating tablet Take 1 tablet (4 mg total) by mouth every 8 (eight) hours as needed for nausea or vomiting.   OXYGEN Inhale 3 L into the lungs See admin instructions. 3 L at bedtime, and 3 L during the day as needed for exertion   pantoprazole (PROTONIX) 40 MG tablet take 1 tablet by mouth once daily MINUTES BEFORE 1ST MEAL OF THE DAY   Potassium Chloride ER 20 MEQ TBCR Take 20 mEq by mouth daily.   predniSONE (DELTASONE) 10 MG tablet Take 1 tablet (10 mg total) by mouth daily with breakfast.    No facility-administered encounter medications on file as of 06/26/2022.    Allergies as of 06/26/2022 - Review Complete 06/02/2022  Allergen Reaction Noted   Codeine Swelling and Other (See Comments) 09/11/2011   Infliximab Anaphylaxis 09/11/2011   Lisinopril Swelling 09/11/2011   Ofev [nintedanib] Diarrhea 04/01/2022    Past Medical History:  Diagnosis Date   Acute on chronic diastolic (congestive) heart failure (HCC)    Anemia    iron deficiency anemia - secondary to blood loss ( chronic)    Anxiety    Arthritis    endstage changes bilateral knees/bilateral ankles.    Asthma    Carotid artery occlusion    Chronic fatigue    Chronic kidney disease    Closed left hip fracture (HCC)    Clotting disorder ([place])  pt denies this   Contusion of left knee    due to fall 1/14.   COPD (chronic obstructive pulmonary disease) (HCC)    pulmonary fibrosis   Depression, reactive    Diabetes mellitus    type II    Diastolic dysfunction    Difficulty in walking    Family history of heart disease    Generalized muscle weakness    Gout    High cholesterol    History of falling    Hypertension    Hypothyroidism    Interstitial lung disease ([place])    Meningioma of left sphenoid wing involving cavernous sinus ([place]) 02/17/2012   Continue diplopia, left eye pain and left headaches.     Morbid obesity ([place])    Neuromuscular disorder ([place])    diabetic neuropathy    Normal coronary arteries    cardiac catheterization performed  10/31/14   Pneumonia    RA (rheumatoid arthritis) ([place])    has been off methotreaxte since 10/13.   Spinal stenosis of lumbar region    Thyroid disease    Unspecified lack of coordination    URI (upper respiratory infection)     Past Surgical History:  Procedure Laterality Date   BRAIN SURGERY     Gamma knife 10/13. Needs repeat spring  '14   CARDIAC CATHETERIZATION N/A 10/31/2014   Procedure: Right/Left Heart Cath and Coronary Angiography;  Surgeon:  [name], MD;  Location: [facility];  Service: Cardiovascular;  Laterality: N/A;   COLONOSCOPY WITH PROPOFOL N/A 04/14/2022   Procedure: COLONOSCOPY WITH PROPOFOL;  Surgeon: Jerene Bears, MD;  Location: [facility];  Service: Gastroenterology;  Laterality: N/A;   CYST EXCISION     2 on face   CYST REMOVAL NECK     ESOPHAGOGASTRODUODENOSCOPY (EGD) WITH PROPOFOL N/A 09/14/2014   Procedure: ESOPHAGOGASTRODUODENOSCOPY (EGD) WITH PROPOFOL;  Surgeon: [name], MD;  Location: [facility];  Service: Endoscopy;  Laterality: N/A;   INCISION AND DRAINAGE HIP Left 01/16/2017   Procedure: IRRIGATION AND DEBRIDEMENT LEFT HIP;  Surgeon: [name], MD;  Location: [facility];  Service: Orthopedics;  Laterality: Left;   INTRAMEDULLARY (IM) NAIL INTERTROCHANTERIC Left 11/29/2016   Procedure: INTRAMEDULLARY (IM) NAIL INTERTROCHANTRIC;  Surgeon: [name], MD;  Location: [facility];  Service: Orthopedics;  Laterality: Left;   OVARY SURGERY     RIGHT HEART CATH N/A 08/12/2021   Procedure: RIGHT HEART CATH;  Surgeon: Lorretta Harp, MD;  Location: [facility];  Service: Cardiovascular;  Laterality: N/A;   SHOULDER SURGERY Left    TONSILLECTOMY  age 15   VAGINAL HYSTERECTOMY     VIDEO BRONCHOSCOPY Bilateral 05/31/2013   Procedure: VIDEO BRONCHOSCOPY WITHOUT FLUORO;  Surgeon: Brand Males, MD;  Location: [facility];  Service: Cardiopulmonary;  Laterality: Bilateral;   video bronscoscopy  05/19/1998   lung    Family History  Problem Relation Age of Onset   Diabetes Mother    Heart attack Mother    Hypertension Father    Lung cancer Father    Diabetes Sister    Breast cancer Sister    Breast cancer Sister    Diabetes Brother    Hypertension Brother    Heart disease Brother    Heart attack Brother    Kidney cancer Brother    Gout Brother    Kidney failure Brother        x 5   Esophageal cancer Brother    Stomach cancer Brother  Prostate  cancer Brother    Uterine cancer Daughter        with mets   Fibroids Daughter    Hypertension Daughter    Hypertension Daughter    Hypertension Son    Heart murmur Son    Rheum arthritis Maternal Uncle    Alzheimer's disease Neg Hx    Dementia Neg Hx     Social History   Socioeconomic History   Marital status: Married    Spouse name: Not on file   Number of children: 6   Years of education: college   Highest education level: Not on file  Occupational History   Occupation: retired Marine scientist  Tobacco Use   Smoking status: Never   Smokeless tobacco: Never  Vaping Use   Vaping Use: Never used  Substance and Sexual Activity   Alcohol use: No   Drug use: No   Sexual activity: Not Currently  Other Topics Concern   Not on file  Social History Narrative   Patient consumes 2-3 cups coffee per day.   Social Determinants of Health   Financial Resource Strain: Low Risk  (10/23/2021)   Overall Financial Resource Strain (CARDIA)    Difficulty of Paying Living Expenses: Not hard at all  Food Insecurity: No Food Insecurity (05/27/2022)   Hunger Vital Sign    Worried About Running Out of Food in the Last Year: Never true    Ran Out of Food in the Last Year: Never true  Transportation Needs: Unmet Transportation Needs (05/28/2022)   PRAPARE - Hydrologist (Medical): Yes    Lack of Transportation (Non-Medical): No  Physical Activity: Insufficiently Active (10/23/2021)   Exercise Vital Sign    Days of Exercise per Week: 2 days    Minutes of Exercise per Session: 40 min  Stress: No Stress Concern Present (10/18/2020)   [place]    Feeling of Stress : Not at all  Social Connections: [place] (10/23/2021)   Social Connection and Isolation Panel [NHANES]    Frequency of Communication with Friends and Family: More than three times a week    Frequency of Social Gatherings with Friends and  Family: More than three times a week    Attends Religious Services: More than 4 times per year    Active Member of [employer] or Organizations: Yes    Attends Music therapist: More than 4 times per year    Marital Status: Married  Human resources officer Violence: Not At Risk (10/23/2021)   Humiliation, Afraid, Rape, and Kick questionnaire    Fear of Current or Ex-Partner: No    Emotionally Abused: No    Physically Abused: No    Sexually Abused: No      Review of systems: All other review of systems negative except as mentioned in the HPI.   Physical Exam: Vitals:   06/26/22 1108  BP: 120/60  Pulse: 89  SpO2: 99%   Body mass index is 38.28 kg/m. Gen:      No acute distress HEENT:  sclera anicteric Abd:      soft, non-tender; no palpable masses, no distension Ext:    No edema Neuro: alert and oriented x 3 Psych: normal mood and affect  Data Reviewed:  Reviewed labs, radiology imaging, old records and pertinent past GI work up   Assessment and Plan/Recommendations:  82 year old very pleasant female with multiple comorbidities including hypertension, CHF, ILD, chronic respiratory failure with  hypoxia on home O2, pulmonary hypertension, DVT PE on chronic anticoagulation here for follow-up visit after colonoscopy for GERD  She has extensive left-sided diverticular disease Advised patient to increase dietary fiber and water intake to prevent constipation Monitor for signs of acute diverticulitis  2 small diminutive colon polyps were noted, but not removed.  Overall risk associated with procedure and anesthesia is much higher, will hold off rescheduling colonoscopy for polypectomy  GERD: Increase pantoprazole to 40 mg twice daily, 30 minutes before breakfast and dinner Discussed antireflux measures  Use Zofran as needed for nausea  Complains of leg cramps, has history of hypokalemia.  Advised patient to maintain adequate hydration.  Electrolyte repletion  This visit  required >40 minutes of patient care (this includes precharting, chart review, review of results, face-to-face time used for counseling as well as treatment plan and follow-up. The patient was provided an opportunity to ask questions and all were answered. The patient agreed with the plan and demonstrated an understanding of the instructions.  Damaris Hippo , MD    CC: [name], MD

## 2022-06-30 ENCOUNTER — Telehealth: Payer: Self-pay | Admitting: Family Medicine

## 2022-06-30 DIAGNOSIS — I2699 Other pulmonary embolism without acute cor pulmonale: Secondary | ICD-10-CM

## 2022-06-30 NOTE — Telephone Encounter (Signed)
Prescription Request  06/30/2022  Is this a "Controlled Substance" medicine? No  LOV: 03/05/2022  What is the name of the medication or equipment? eliquis  Have you contacted your pharmacy to request a refill? No   Which pharmacy would you like this sent to?   [facility] [place], [place] - [address] Phone: [phone]  Fax: [phone]      Patient notified that their request is being sent to the clinical staff for review and that they should receive a response within 2 business days. Pt want a 90 day supply  Please advise at Mobile [phone] (mobile)

## 2022-07-02 NOTE — Telephone Encounter (Signed)
Are you prescribing this medication still. Last filled by you 2020. Please advise if ok to fill

## 2022-07-04 MED ORDER — APIXABAN 5 MG PO TABS: 5.0000 mg | ORAL_TABLET | Freq: Two times a day (BID) | ORAL | 0 refills | Status: DC

## 2022-07-04 NOTE — Telephone Encounter (Signed)
Looks like it was done by Cardiology.    If she is close to being out ok to send in a refill.

## 2022-07-04 NOTE — Telephone Encounter (Signed)
Rx refilled for month supply. Other request in the future should come from Lorretta Harp, MD.

## 2022-07-11 ENCOUNTER — Telehealth: Payer: Self-pay | Admitting: Cardiovascular Disease

## 2022-07-11 DIAGNOSIS — I2699 Other pulmonary embolism without acute cor pulmonale: Secondary | ICD-10-CM

## 2022-07-11 MED ORDER — APIXABAN 5 MG PO TABS: 5.0000 mg | ORAL_TABLET | Freq: Two times a day (BID) | ORAL | 1 refills | Status: DC

## 2022-07-11 NOTE — Telephone Encounter (Signed)
Prescription refill request for Eliquis received. Indication:PE/DVT Last office visit: 03/27/22 ([place])  Scr: 0.91 (06/26/22)  Age: 82 Weight: 101.2kg  Appropriate dose. Refill sent.

## 2022-07-11 NOTE — Telephone Encounter (Signed)
*  STAT* If patient is at the pharmacy, call can be transferred to refill team.   1. Which medications need to be refilled? (please list name of each medication and dose if known) apixaban (ELIQUIS) 5 MG TABS tablet   2. Which pharmacy/location (including street and city if local pharmacy) is medication to be sent to? [facility] [place], [place] - [address] [facility] [address]   3. Do they need a 30 day or 90 day supply? [place]

## 2022-07-15 DIAGNOSIS — M1A00X0 Idiopathic chronic gout, unspecified site, without tophus (tophi): Secondary | ICD-10-CM | POA: Diagnosis not present

## 2022-07-15 DIAGNOSIS — M0579 Rheumatoid arthritis with rheumatoid factor of multiple sites without organ or systems involvement: Secondary | ICD-10-CM | POA: Diagnosis not present

## 2022-07-15 DIAGNOSIS — Z7960 Long term (current) use of unspecified immunomodulators and immunosuppressants: Secondary | ICD-10-CM | POA: Diagnosis not present

## 2022-07-15 DIAGNOSIS — Z79899 Other long term (current) drug therapy: Secondary | ICD-10-CM | POA: Diagnosis not present

## 2022-07-23 ENCOUNTER — Telehealth: Payer: Self-pay | Admitting: *Deleted

## 2022-07-23 DIAGNOSIS — I129 Hypertensive chronic kidney disease with stage 1 through stage 4 chronic kidney disease, or unspecified chronic kidney disease: Secondary | ICD-10-CM | POA: Diagnosis not present

## 2022-07-23 DIAGNOSIS — N2581 Secondary hyperparathyroidism of renal origin: Secondary | ICD-10-CM | POA: Diagnosis not present

## 2022-07-23 DIAGNOSIS — N183 Chronic kidney disease, stage 3 unspecified: Secondary | ICD-10-CM | POA: Diagnosis not present

## 2022-07-23 DIAGNOSIS — E1122 Type 2 diabetes mellitus with diabetic chronic kidney disease: Secondary | ICD-10-CM | POA: Diagnosis not present

## 2022-07-23 DIAGNOSIS — I5032 Chronic diastolic (congestive) heart failure: Secondary | ICD-10-CM

## 2022-07-23 DIAGNOSIS — E1140 Type 2 diabetes mellitus with diabetic neuropathy, unspecified: Secondary | ICD-10-CM

## 2022-07-23 DIAGNOSIS — N189 Chronic kidney disease, unspecified: Secondary | ICD-10-CM | POA: Diagnosis not present

## 2022-07-23 NOTE — Telephone Encounter (Signed)
Pharmacy referral placed as requested.

## 2022-07-24 LAB — LAB REPORT - SCANNED: EGFR: 73

## 2022-07-28 ENCOUNTER — Encounter: Payer: Self-pay | Admitting: Registered Nurse

## 2022-07-28 ENCOUNTER — Encounter: Payer: Medicare Other | Attending: Physical Medicine & Rehabilitation | Admitting: Registered Nurse

## 2022-07-28 VITALS — BP 144/84 | HR 96 | Ht 64.0 in | Wt 223.0 lb

## 2022-07-28 DIAGNOSIS — M5412 Radiculopathy, cervical region: Secondary | ICD-10-CM | POA: Insufficient documentation

## 2022-07-28 DIAGNOSIS — G894 Chronic pain syndrome: Secondary | ICD-10-CM | POA: Diagnosis not present

## 2022-07-28 DIAGNOSIS — M5416 Radiculopathy, lumbar region: Secondary | ICD-10-CM | POA: Diagnosis not present

## 2022-07-28 DIAGNOSIS — M546 Pain in thoracic spine: Secondary | ICD-10-CM | POA: Insufficient documentation

## 2022-07-28 DIAGNOSIS — M25511 Pain in right shoulder: Secondary | ICD-10-CM | POA: Diagnosis not present

## 2022-07-28 DIAGNOSIS — M170 Bilateral primary osteoarthritis of knee: Secondary | ICD-10-CM | POA: Insufficient documentation

## 2022-07-28 DIAGNOSIS — M542 Cervicalgia: Secondary | ICD-10-CM | POA: Diagnosis not present

## 2022-07-28 DIAGNOSIS — M545 Low back pain, unspecified: Secondary | ICD-10-CM | POA: Diagnosis not present

## 2022-07-28 DIAGNOSIS — M2550 Pain in unspecified joint: Secondary | ICD-10-CM | POA: Diagnosis not present

## 2022-07-28 DIAGNOSIS — M7061 Trochanteric bursitis, right hip: Secondary | ICD-10-CM | POA: Diagnosis not present

## 2022-07-28 DIAGNOSIS — M7062 Trochanteric bursitis, left hip: Secondary | ICD-10-CM | POA: Insufficient documentation

## 2022-07-28 DIAGNOSIS — Z5181 Encounter for therapeutic drug level monitoring: Secondary | ICD-10-CM | POA: Insufficient documentation

## 2022-07-28 DIAGNOSIS — Z79891 Long term (current) use of opiate analgesic: Secondary | ICD-10-CM | POA: Diagnosis not present

## 2022-07-28 DIAGNOSIS — G8929 Other chronic pain: Secondary | ICD-10-CM | POA: Diagnosis not present

## 2022-07-28 MED ORDER — DULOXETINE HCL 60 MG PO CPEP: 60.0000 mg | ORAL_CAPSULE | Freq: Every day | ORAL | 2 refills | Status: DC

## 2022-07-28 MED ORDER — CYCLOBENZAPRINE HCL 5 MG PO TABS: 5.0000 mg | ORAL_TABLET | Freq: Every evening | ORAL | 2 refills | Status: DC | PRN

## 2022-07-28 MED ORDER — GABAPENTIN 600 MG PO TABS: 1200.0000 mg | ORAL_TABLET | Freq: Three times a day (TID) | ORAL | 2 refills | Status: DC

## 2022-07-28 NOTE — Progress Notes (Signed)
Subjective:    Patient ID: [name], female    DOB: [birth year], 82 y.o.   MRN: [id number]  HPI: [name] is a 82 y.o. female who returns for follow up appointment for chronic pain and medication refill. She states her pain is located in her neck radiating into her bilateral shoulders R>L and radiating into her arm with tingling and numbness. She also reports mid- lower back pain radiating into her bilateral hips , bilateral knee pain and bilateral ankle pain. She rates her pain 7. Her current exercise regime is walking and performing stretching exercises.  Ms. [place]immer[place] Morphine equivalent is 60.00 MME.   Oral Swab was performed today.      Pain Inventory Average Pain 7 Pain Right Now 7 My pain is sharp, burning, dull, and tingling  In the last 24 hours, has pain interfered with the following? General activity 8 Relation with others 8 Enjoyment of life 8 What TIME of day is your pain at its worst? morning  Sleep (in general) Fair  Pain is worse with: walking, bending, inactivity, and standing Pain improves with: rest, heat/ice, and medication Relief from Meds: 5  Family History  Problem Relation Age of Onset   Diabetes Mother    Heart attack Mother    Hypertension Father    Lung cancer Father    Diabetes Sister    Breast cancer Sister    Breast cancer Sister    Diabetes Brother    Hypertension Brother    Heart disease Brother    Heart attack Brother    Kidney cancer Brother    Gout Brother    Kidney failure Brother        x 5   Esophageal cancer Brother    Stomach cancer Brother    Prostate cancer Brother    Uterine cancer Daughter        with mets   Fibroids Daughter    Hypertension Daughter    Hypertension Daughter    Hypertension Son    Heart murmur Son    Rheum arthritis Maternal Uncle    Alzheimer's disease Neg Hx    Dementia Neg Hx    Social History   Socioeconomic History   Marital status: Married    Spouse name: Not on file    Number of children: 6   Years of education: college   Highest education level: Not on file  Occupational History   Occupation: retired Marine scientist  Tobacco Use   Smoking status: Never   Smokeless tobacco: Never  Vaping Use   Vaping Use: Never used  Substance and Sexual Activity   Alcohol use: No   Drug use: No   Sexual activity: Not Currently  Other Topics Concern   Not on file  Social History Narrative   Patient consumes 2-3 cups coffee per day.   Social Determinants of Health   Financial Resource Strain: Low Risk  (10/23/2021)   Overall Financial Resource Strain (CARDIA)    Difficulty of Paying Living Expenses: Not hard at all  Food Insecurity: No Food Insecurity (05/27/2022)   Hunger Vital Sign    Worried About Running Out of Food in the Last Year: Never true    Ran Out of Food in the Last Year: Never true  Transportation Needs: Unmet Transportation Needs (05/28/2022)   PRAPARE - Hydrologist (Medical): Yes    Lack of Transportation (Non-Medical): No  Physical Activity: Insufficiently Active (10/23/2021)   Exercise Vital  Sign    Days of Exercise per Week: 2 days    Minutes of Exercise per Session: 40 min  Stress: No Stress Concern Present (10/18/2020)   [place]    Feeling of Stress : Not at all  Social Connections: [place] (10/23/2021)   Social Connection and Isolation Panel [NHANES]    Frequency of Communication with Friends and Family: More than three times a week    Frequency of Social Gatherings with Friends and Family: More than three times a week    Attends Religious Services: More than 4 times per year    Active Member of [employer] or Organizations: Yes    Attends Archivist Meetings: More than 4 times per year    Marital Status: Married   Past Surgical History:  Procedure Laterality Date   BRAIN SURGERY     Gamma knife 10/13. Needs repeat spring  '14    CARDIAC CATHETERIZATION N/A 10/31/2014   Procedure: Right/Left Heart Cath and Coronary Angiography;  Surgeon: [name], MD;  Location: [place] [facility];  Service: Cardiovascular;  Laterality: N/A;   COLONOSCOPY WITH PROPOFOL N/A 04/14/2022   Procedure: COLONOSCOPY WITH PROPOFOL;  Surgeon: Jerene Bears, MD;  [facility];  Service: Gastroenterology;  Laterality: N/A;   CYST EXCISION     2 on face   CYST REMOVAL NECK     ESOPHAGOGASTRODUODENOSCOPY (EGD) WITH PROPOFOL N/A 09/14/2014   Procedure: ESOPHAGOGASTRODUODENOSCOPY (EGD) WITH PROPOFOL;  Surgeon: [name], MD;  [facility];  Service: Endoscopy;  Laterality: N/A;   INCISION AND DRAINAGE HIP Left 01/16/2017   Procedure: IRRIGATION AND DEBRIDEMENT LEFT HIP;  Surgeon: [name], MD;  [facility];  Service: Orthopedics;  Laterality: Left;   INTRAMEDULLARY (IM) NAIL INTERTROCHANTERIC Left 11/29/2016   Procedure: INTRAMEDULLARY (IM) NAIL INTERTROCHANTRIC;  Surgeon: [name], MD;  Location: [place];  Service: Orthopedics;  Laterality: Left;   OVARY SURGERY     RIGHT HEART CATH N/A 08/12/2021   Procedure: RIGHT HEART CATH;  Surgeon: Lorretta Harp, MD;  Location: [place] [facility];  Service: Cardiovascular;  Laterality: N/A;   SHOULDER SURGERY Left    TONSILLECTOMY  age 45   VAGINAL HYSTERECTOMY     VIDEO BRONCHOSCOPY Bilateral 05/31/2013   Procedure: VIDEO BRONCHOSCOPY WITHOUT FLUORO;  Surgeon: Brand Males, MD;  Location: [place];  Service: Cardiopulmonary;  Laterality: Bilateral;   video bronscoscopy  05/19/1998   lung   Past Surgical History:  Procedure Laterality Date   BRAIN SURGERY     Gamma knife 10/13. Needs repeat spring  '14   CARDIAC CATHETERIZATION N/A 10/31/2014   Procedure: Right/Left Heart Cath and Coronary Angiography;  Surgeon: [name], MD;  Location: [place] [facility];  Service: Cardiovascular;  Laterality: N/A;   COLONOSCOPY  WITH PROPOFOL N/A 04/14/2022   Procedure: COLONOSCOPY WITH PROPOFOL;  Surgeon: Jerene Bears, MD;  [facility];  Service: Gastroenterology;  Laterality: N/A;   CYST EXCISION     2 on face   CYST REMOVAL NECK     ESOPHAGOGASTRODUODENOSCOPY (EGD) WITH PROPOFOL N/A 09/14/2014   Procedure: ESOPHAGOGASTRODUODENOSCOPY (EGD) WITH PROPOFOL;  Surgeon: [name], MD;  [facility];  Service: Endoscopy;  Laterality: N/A;   INCISION AND DRAINAGE HIP Left 01/16/2017   Procedure: IRRIGATION AND DEBRIDEMENT LEFT HIP;  Surgeon: [name], MD;  [facility];  Service: Orthopedics;  Laterality: Left;  INTRAMEDULLARY (IM) NAIL INTERTROCHANTERIC Left 11/29/2016   Procedure: INTRAMEDULLARY (IM) NAIL INTERTROCHANTRIC;  Surgeon: [name], MD;  Location: [place];  Service: Orthopedics;  Laterality: Left;   OVARY SURGERY     RIGHT HEART CATH N/A 08/12/2021   Procedure: RIGHT HEART CATH;  Surgeon: Lorretta Harp, MD;  Location: [place] [facility];  Service: Cardiovascular;  Laterality: N/A;   SHOULDER SURGERY Left    TONSILLECTOMY  age 63   VAGINAL HYSTERECTOMY     VIDEO BRONCHOSCOPY Bilateral 05/31/2013   Procedure: VIDEO BRONCHOSCOPY WITHOUT FLUORO;  Surgeon: Brand Males, MD;  Location: [place];  Service: Cardiopulmonary;  Laterality: Bilateral;   video bronscoscopy  05/19/1998   lung   Past Medical History:  Diagnosis Date   Acute on chronic diastolic (congestive) heart failure (HCC)    Anemia    iron deficiency anemia - secondary to blood loss ( chronic)    Anxiety    Arthritis    endstage changes bilateral knees/bilateral ankles.    Asthma    Carotid artery occlusion    Chronic fatigue    Chronic kidney disease    Closed left hip fracture (HCC)    Clotting disorder ([place])    pt denies this   Contusion of left knee    due to fall 1/14.   COPD (chronic obstructive pulmonary disease) (HCC)    pulmonary fibrosis   Depression,  reactive    Diabetes mellitus    type II    Diastolic dysfunction    Difficulty in walking    Family history of heart disease    Generalized muscle weakness    Gout    High cholesterol    History of falling    Hypertension    Hypothyroidism    Interstitial lung disease ([place])    Meningioma of left sphenoid wing involving cavernous sinus ([place]) 02/17/2012   Continue diplopia, left eye pain and left headaches.     Morbid obesity ([place])    Neuromuscular disorder ([place])    diabetic neuropathy    Normal coronary arteries    cardiac catheterization performed  10/31/14   Pneumonia    RA (rheumatoid arthritis) ([place])    has been off methotreaxte since 10/13.   Spinal stenosis of lumbar region    Thyroid disease    Unspecified lack of coordination    URI (upper respiratory infection)    BP (!) 144/84   Pulse 96   Ht 5\' 4"  (1.626 m)   Wt 223 lb (101.2 kg) Comment: last recorded  SpO2 91%   BMI 38.28 kg/m   Opioid Risk Score:   Fall Risk Score:  `1  Depression screen [facility] 2/9     06/02/2022   10:12 AM 02/10/2022   11:39 AM 12/20/2021    3:26 PM 12/09/2021    1:12 PM 12/02/2021   11:22 AM 10/23/2021   11:52 AM 09/25/2021    3:21 PM  Depression screen PHQ 2/9  Decreased Interest 0 1 2 2  0 0 1  Down, Depressed, Hopeless 0 1 2 2  0 0 1  PHQ - 2 Score 0 2 4 4  0 0 2  Altered sleeping   1 1  0 0  Tired, decreased energy  2 1 2   0 2  Change in appetite  0 1 1  0 1  Feeling bad or failure about yourself   0 2 2  0 1  Trouble concentrating  0 2 1  0 1  Moving  slowly or fidgety/restless  0 2 2  0 2  Suicidal thoughts  0 0 1  0 0  PHQ-9 Score   13 14  0 9  Difficult doing work/chores  Very difficult Very difficult Extremely dIfficult       Review of Systems  Constitutional: Negative.   HENT: Negative.    Eyes: Negative.   Respiratory: Negative.    Cardiovascular: Negative.   Gastrointestinal: Negative.   Endocrine: Negative.   Genitourinary: Negative.   Musculoskeletal:  Positive for  arthralgias and back pain.  Skin: Negative.   Allergic/Immunologic: Negative.   Neurological: Negative.   Hematological: Negative.   Psychiatric/Behavioral: Negative.    All other systems reviewed and are negative.      Objective:   Physical Exam Vitals and nursing note reviewed.  Constitutional:      Appearance: Normal appearance.  Cardiovascular:     Rate and Rhythm: Normal rate and regular rhythm.     Pulses: Normal pulses.     Heart sounds: Normal heart sounds.  Musculoskeletal:     Cervical back: Normal range of motion and neck supple.     Comments: Normal Muscle Bulk and Muscle Testing Reveals:  Upper Extremities: Right: Decreased ROM 90 Degrees and Muscle Strength 5/5 Left Upper Extremity: Full ROM and Muscle Strength 5/5 Bilateral AC Joint Tenderness  Thoracic and Lumbar Hypersensitivity Lower Extremities: Decreased ROM and Muscle Strength 5/5 Bilateral Lower Extremities Flexion Produces Pain into her Bilateral Patella's Arrived in wheelchair      Skin:    General: Skin is warm and dry.  Neurological:     Mental Status: She is alert.         Assessment & Plan:  1. Lumbar spinal stenosis with neurogenic claudication. Associated facet arthropathy/ Lumbar Radiculitis: Continue current medication regimen with Gabapentin and HEP as tolerated. 07/28/2022 Refilled: MS Contin 15 mg one tablet every 12 hours #60 and  MSIR 15 one tablet twice a day as needed  For break through pain ( 6 am and 1:00 pm)  #60.since adverse effect with insomnia with Nucynta. Nucynta discontinued due to insomnia.  We will continue the opioid monitoring program, this consists of regular clinic visits, examinations, urine drug screen, pill counts as well as use of [place] Controlled Substance Reporting system. A 12 month History has been reviewed on the [place] Controlled Substance Reporting System 07/28/2022 2. Chronic Bilateral Thoracic Back Pain: Continue HEP as Tolerated. Continue  to Monitor. 07/28/2022 3 Depression: Continue current medication Regime: Cymbalta . Continue to monitor.07/28/2022 4. Diabetes mellitus type 2 with polyneuropathy: Continue current medication regime: Gabapentin. 07/28/2022 5. Rheumatoid arthritis and osteoarthritis. Continue current medication Regime.  Voltaren Gel. Rheumatology Following. 07/28/2022 6. Interstitial lung disease: Pulmonology Following. 07/28/2022. 7. Bilateral Osteoarthritis Knee's:  Continue current medication regimen. Voltaren Gel. 07/28/2022. 8. Muscle Spasm: Continue current medication regime : Flexeril. Continue to monitor.07/28/2022 9. Bilateral Greater Trochanteric Tenderness: L>R. Continue current treatment with Ice/Heat Therapy. 07/28/2022. 10. Polyarthralgia: Rheumatology Following: Continue to monitor.07/28/2022 11. Morbid Obesitity: Continue Healthy Diet Regime and HEP. Continue to monitor.  07/28/2022 12. Cervicalgia/Cervical Radiculitis: No complaints today. Continue to Alternate with Heat and Ice Therapy. Continue to monitor. 07/28/2022     F/U in 2 months

## 2022-08-01 LAB — DRUG TOX MONITOR 1 W/CONF, ORAL FLD

## 2022-08-01 LAB — DRUG TOX ALC METAB W/CON, ORAL FLD: Alcohol Metabolite: NEGATIVE ng/mL (ref <25)

## 2022-08-02 MED ORDER — MORPHINE SULFATE ER 15 MG PO TBCR: 15.0000 mg | EXTENDED_RELEASE_TABLET | Freq: Two times a day (BID) | ORAL | 0 refills | Status: DC

## 2022-08-02 MED ORDER — MORPHINE SULFATE 15 MG PO TABS: 15.0000 mg | ORAL_TABLET | Freq: Two times a day (BID) | ORAL | 0 refills | Status: DC

## 2022-08-04 ENCOUNTER — Encounter: Payer: Medicare Other | Admitting: Registered Nurse

## 2022-08-04 ENCOUNTER — Other Ambulatory Visit: Payer: Self-pay | Admitting: Cardiovascular Disease

## 2022-08-04 DIAGNOSIS — I2699 Other pulmonary embolism without acute cor pulmonale: Secondary | ICD-10-CM

## 2022-08-04 NOTE — Telephone Encounter (Signed)
*  STAT* If patient is at the pharmacy, call can be transferred to refill team.   1. Which medications need to be refilled? (please list name of each medication and dose if known) apixaban (ELIQUIS) 5 MG TABS tablet  Take 1 tablet (5 mg total) by mouth 2 (two) times daily.  2. Which pharmacy/location (including street and city if local pharmacy) is medication to be sent to? [facility] [place], [place] - [address] [facility]   3. Do they need a 30 day or 90 day supply? 90 day supply  Pt requested medication on 07/11/22 but there was an error and "the message was not sent electronically to the requested pharmacy". Pt is starting to run low on medication. Please advise.

## 2022-08-05 ENCOUNTER — Telehealth: Payer: Self-pay | Admitting: Family Medicine

## 2022-08-05 DIAGNOSIS — I2699 Other pulmonary embolism without acute cor pulmonale: Secondary | ICD-10-CM

## 2022-08-05 NOTE — Telephone Encounter (Signed)
Prescription Request  08/05/2022  LOV: 03/05/2022  What is the name of the medication or equipment? Dulaglutide (TRULICITY) A999333 [id number] SOPN and  atorvastatin (LIPITOR) 20 MG tablet   Have you contacted your pharmacy to request a refill? No   Which pharmacy would you like this sent to?   [facility] - [place], [place] [address] [facility] [address] [place] [address] Phone: [phone] Fax: [phone]     Patient notified that their request is being sent to the clinical staff for review and that they should receive a response within 2 business days.   Please advise at Mobile [phone] (mobile)

## 2022-08-06 ENCOUNTER — Other Ambulatory Visit: Payer: Self-pay | Admitting: Radiology

## 2022-08-06 DIAGNOSIS — E782 Mixed hyperlipidemia: Secondary | ICD-10-CM

## 2022-08-06 DIAGNOSIS — E1140 Type 2 diabetes mellitus with diabetic neuropathy, unspecified: Secondary | ICD-10-CM

## 2022-08-06 MED ORDER — ATORVASTATIN CALCIUM 20 MG PO TABS: 10.0000 mg | ORAL_TABLET | Freq: Every evening | ORAL | 0 refills | Status: DC

## 2022-08-06 MED ORDER — TRULICITY 0.75 MG/0.5ML ~~LOC~~ SOAJ: 0.7500 mg | SUBCUTANEOUS | 2 refills | Status: DC

## 2022-08-06 MED ORDER — APIXABAN 5 MG PO TABS: 5.0000 mg | ORAL_TABLET | Freq: Two times a day (BID) | ORAL | 1 refills | Status: DC

## 2022-08-06 NOTE — Telephone Encounter (Signed)
Pt last saw Christen Bame, NP on 03/27/22, last labs 07/15/22 Creat 1.0, age 82, weight 101.2kg, based on specified criteria pt is on appropriate dosage of Eliquis 5mg  BID for PE/DVT  Will refill rx.

## 2022-08-07 ENCOUNTER — Other Ambulatory Visit: Payer: Self-pay

## 2022-08-07 DIAGNOSIS — E782 Mixed hyperlipidemia: Secondary | ICD-10-CM

## 2022-08-07 DIAGNOSIS — E1140 Type 2 diabetes mellitus with diabetic neuropathy, unspecified: Secondary | ICD-10-CM

## 2022-08-07 MED ORDER — TRULICITY 0.75 MG/0.5ML ~~LOC~~ SOAJ: 0.7500 mg | SUBCUTANEOUS | 2 refills | Status: DC

## 2022-08-07 MED ORDER — ATORVASTATIN CALCIUM 20 MG PO TABS: 10.0000 mg | ORAL_TABLET | Freq: Every evening | ORAL | 0 refills | Status: DC

## 2022-08-07 NOTE — Telephone Encounter (Signed)
Rx sent and patient notified to contact Dr. Kennon Holter office for refill of Elliquis

## 2022-08-07 NOTE — Telephone Encounter (Addendum)
Patient's medication was sent to [facility].  Eliquis, Trulicity and Atorvastatin all was supposed to be sent to the [facility].  Please correct for patient.  Patient will be out of Eliquis in 2 days.  Patient is requesting to let her know once it has been taken care of.

## 2022-08-08 ENCOUNTER — Telehealth: Payer: Self-pay | Admitting: Cardiovascular Disease

## 2022-08-08 DIAGNOSIS — I2699 Other pulmonary embolism without acute cor pulmonale: Secondary | ICD-10-CM

## 2022-08-08 MED ORDER — APIXABAN 5 MG PO TABS: 5.0000 mg | ORAL_TABLET | Freq: Two times a day (BID) | ORAL | 1 refills | Status: DC

## 2022-08-08 NOTE — Telephone Encounter (Signed)
Prescription refill request for Eliquis received. Indication: dvt, PE Last office visit: Swinyer, 03/27/2022 Scr: 1.0, 07/15/2022 Age: 82 yo  Weight: 101.2 kg   Refill sent.

## 2022-08-08 NOTE — Telephone Encounter (Signed)
*  STAT* If patient is at the pharmacy, call can be transferred to refill team.   1. Which medications need to be refilled? (please list name of each medication and dose if known)   apixaban (ELIQUIS) 5 MG TABS tablet    2. Which pharmacy/location (including street and city if local pharmacy) is medication to be sent to? [facility] [place], [place] - [address] [facility]   3. Do they need a 30 day or 90 day supply? 90 day   Medication refill was sent to wrong pharmacy. Pt is almost out of medication

## 2022-08-11 ENCOUNTER — Telehealth: Payer: Self-pay

## 2022-08-11 NOTE — Progress Notes (Signed)
Patient ID: [name], female   DOB: 1940-10-31, 82 y.o.   MRN: [id number]  Care Management & Coordination Services Pharmacy Team  Reason for Encounter: Chart Prep for initial encounter on 08/13/22 at 11 am in office with [name]. Pharm D.  Spoke with patient on 08/11/2022   Have you seen any other providers since your last visit? Patient reports other than Gastro no.  Any changes in your medications or health? Patient reports she thinks her rheumatoid arthritis is worse, she reports her hands are bending a lot and her body and joints hurt, she reports he thighs are tender to the touch.  Any side effects from any medications? Patient reports her pain medications help but she does not think as well as they once have as her pain is more now. She is always in a lot of pain.  Do you have an symptoms or problems not managed by your medications? Patient reports she just started on Enbrel not to long ago and she still is hurting a lot.  Any concerns about your health right now? Patient reports her main concerns lately have been with the PCP office and getting her prescriptions sent to the correct pharmacy on time. She reports the [place] is her preferred pharmacy  because they cover the cost with 0.00 copay for the most part. Patient states she gets a 25% discount if she uses [facility] but even with that she can not afford her medications so she only prefers [facility] in urgent situations as the [place] requires a prescription and time frame of about 2 weeks. She further concludes that she does not like having to call the office to request refills and feels like she is being a bother doing so, she would like to request 9 DS with her medications and with refills so she does not have to do this. She has been without because she can not afford to pick them [facility]. [facility] and not sure what happened and why. Patient lastly states she feels like she is taking a lot  of medications and has a lot of different prescribers and offices and is not always sure who to ask for what as far as refills for example her Zofran she inquired if she should contact PCP for this as she was told by GI whom prescribed advised she should ask for refills.  Do you get any type of exercise on a regular basis? Patient reports her pain has had an impact and leading to decreased activity levels.  Patient aware to bring blood pressure cuff, medications that do not need refrigeration and supplements to appointment    Chart review:  Recent office visits:   03/05/22 [name], MD - Patient presented for Chronic right shoulder pain and other concerns. No medication changes.   Recent consult visits:  07/28/22 [name], NP - Patient presented for Lumbar radiculitis and other concerns. No medication changes.   07/15/22 [name], PA  (Rheumatology) - Patient presented for Rheumatoid arthritis involving multiple sites with positive rheumatoid factor and other concerns.Prescribed Deltasone. Prescribed Arava. Prescribed Plaquenil. Prescribed Zyloprim.  06/26/22 [name], MD [facility]) - Patient presented for Hypokalemia and other concerns. Increased Pantoprazole.   06/02/22 [name], NP - Patient presented for Chronic right shoulder pain and other concerns. No medication changes.   05/27/22 Brand Males, MD (Pulmonology) - Patient presented for ILD and other concerns. Stopped nintedanib   04/30/22  Ralene Ok, MD (Gen Surg) - Patient presented for Stricture of sigmoid colon and other concerns. No medication changes.   04/28/22 [name], MD  (Pulmonology) - Patient presented for Rheumatoid arthritis involving multiple sites with positive rheumatoid factor and other concerns.  No medication changes.  04/14/22 Patient presented to [facility] for Colonoscopy.  04/01/22 Brand Males, MD (Pulmonology) - Patient presented for ILD and  other concerns. No medication changes.   03/31/22 [name], NP - Patient presented for Chronic pain syndrome and other concerns. No medication changes.  03/27/22 Swinyer, Lanice Schwab, NP (Cardiology) - Patient presented for Pre-op exam. No medication changes.   03/21/22 [name], MD (Ortho) - Patient presented for Closed nondisplaced fracture of proximal phalynx of right little finger and other concerns. No medication changes.  03/11/22 [name], NP [facility]) - Patient presented for Abnormal CT scan gastro tract and other concerns. No medication changes.  03/06/22 [name], NP (Neurology) - Patient presented for Cognitive decline and other concerns. No medication changes.   02/28/22 [name], MD- (Ortho) Patient presented for Closed nondisplaced fracture of proximal phalynx of right little finger.  No medication changes.  02/25/22 [name], [facility] (Pharmacist) - Patient presented for Essential hypertension. No medication changes.   02/14/22 [name], MD (Ortho) - Patient presented for Closed nondisplaced fracture of proximal phalanx of right little finger and other concerns. No medication changes.   Hospital visits:  Medication Reconciliation was completed by comparing discharge summary, patient's EMR and Pharmacy list, and upon discussion with patient.  Patient presented to [place] at [facility] on 03/01/22 due to Lower abdominal pain and other concerns. Patient was present for 6 hours.  New?Medications Started at [facility] Discharge:?? -started  Zofran  Medication Changes at Hospital Discharge: -Changed  none  Medications Discontinued at Hospital Discharge: -Stopped  none  Medications that remain the same after Hospital Discharge:??  -All other medications will remain the same.    Fill History : ALBUTEROL 0.083%(2.5MG /3ML) 25X3ML 05/03/2021 6   ALLOPURINOL 300MG  TABLETS 05/03/2021 30   AMLODIPINE  BESYLATE 10MG  TABLETS 08/01/2022 90   ELIQUIS 5MG  TABLETS 08/10/2022 30   ATORVASTATIN 20MG  TABLETS 08/06/2022 30   AZITHROMYCIN 250MG  TABLETS 07/26/2022 28   CYCLOBENZAPRINE 5MG  TABLETS 05/03/2021 30   DONEPEZIL 5MG  TABLETS [id number] 90   TRULICITY 0.75MG /0.5ML SDP 0.5ML 05/31/2021 28   DULOXETINE DR 60MG  CAPSULES 05/03/2021 30   FUROSEMIDE 80MG  TABLETS 07/13/2022 30   GABAPENTIN 600MG  TABLETS 05/03/2021 30   HYDRALAZINE  25MG  TABLETS(ORANGE) 05/21/2022 90   HYDROXYCHLOROQUINE 200MG  TABLETS 05/03/2021 30   LEFLUNOMIDE 20MG  TABLETS 05/06/2021 30   LEVOTHYROXINE 0.05MG  (50MCG) TAB 05/03/2021 30   LINZESS 72MCG CAPSULES 05/06/2021 3   METOLAZONE 2.5MG  TABLETS 05/03/2021 30   METOPROLOL ER SUCCINATE 100MG  TABS 05/26/2022 90   MORPHINE SUL 15MG  ER TABS (12H) 07/28/2022 30   ONDANSETRON ODT 4MG  TABLETS 03/29/2022 4   PANTOPRAZOLE 40MG  TABLETS 05/03/2021 30   POTASSIUM CL 20MEQ ER TABLETS 02/06/2020 90   PREDNISONE 20MG  TABLETS 12/20/2021 4    Star Rating Drugs:  Atorvastatin 20 mg - Last filled 08/06/22 30 DS at [facility] Trulicity [id number] mg - Last filled 05/31/21 28 DS at [place]: Diabetic Urine - Overdue Zoster Vaccine - Overdue HGB A1C - Overdue COVID Booster - Overdue AWV - 11/22/21  [place] Clinical Pharmacist Assistant [phone]

## 2022-08-12 ENCOUNTER — Telehealth: Payer: Self-pay

## 2022-08-12 NOTE — Progress Notes (Signed)
Patient ID: [name], female   DOB: Dec 03, 1940, 82 y.o.   MRN: [id number]  Care Management & Coordination Services Pharmacy Team  Reason for Encounter: Appointment Reminder  Contacted patient to confirm in office appointment with [name], PharmD on 08/12/21 at 11:30. Spoke with patient on 08/12/2022    Hospital visits:  Medication Reconciliation was completed by comparing discharge summary, patient's EMR and Pharmacy list, and upon discussion with patient.   Patient presented to [place] at [facility] on 03/01/22 due to Lower abdominal pain and other concerns. Patient was present for 6 hours.   New?Medications Started at [facility] Discharge:?? -started  Zofran   Medication Changes at Hospital Discharge: -Changed  none   Medications Discontinued at Hospital Discharge: -Stopped  none   Medications that remain the same after Hospital Discharge:??  -All other medications will remain the same.  Star Rating Drugs:  Atorvastatin 20 mg - Last filled 08/06/22 30 DS at [facility] Trulicity [id number] mg - Last filled 05/31/21 28 DS at [place]: Diabetic Urine - Overdue Zoster Vaccine - Overdue HGB A1C - Overdue COVID Booster - Overdue AWV - 11/22/21    Ned Clines [place] Clinical Pharmacist Assistant [phone]

## 2022-08-12 NOTE — Progress Notes (Unsigned)
Care Management & Coordination Services Pharmacy Note  08/12/2022 Name:  [name] MRN:  [id number] DOB:  1940-06-27  Summary: -Pt having periods of time with no medications due to long processing time of [facility], and requests 90DS of all maintenance medications. Added to patient FYI -BP not at goal <140/90 at last OV, but at goal with home readings -LDL at goal <70 (2022), due for update, difficult splitting 20mg  lipitor tab to get 10mg  daily -A1C at goal <7, having issues getting trulicity. With PCP approval, ozempic samples provided in office to switch back. -Last DEXA scan in 2013, due for update  Recommendations/Changes made from today's visit: -Check BP at home 2-3x/weekly and keep a log -ORDER updated lipid panel with PCP approval for next OV in April -ORDER lipitor 10mg  once daily to avoid pill splitting, with PCP approval -ORDER ozempic 0.5mg  once weekly, with PCP approval, to start once patient finishes sample -ORDER dexa scan with PCP approval  Follow up plan: -DM call in 1 month -BP call in 2 months -Pharmacist visit in 3 months   Subjective: [name] is an 82 y.o. year old female who is a primary patient of [name], MD.  The care coordination team was consulted for assistance with disease management and care coordination needs.    Engaged with patient face to face for initial visit. Patient presents in a wheelchair with her daughter Roanna Epley. Patient was previously a Marine scientist and has a very good understanding of her medications and what they are for. Daughter also works for [employer] and help takes the patient to appts.   Recent office visits: 03/05/22 [name], MD - Patient presented for Chronic right shoulder pain and other concerns. No medication changes.   Recent consult visits: 07/28/22 [name], NP - Patient presented for Lumbar radiculitis and other concerns. No medication changes.    07/15/22 [name], PA  (Rheumatology) -  Patient presented for Rheumatoid arthritis involving multiple sites with positive rheumatoid factor and other concerns.Prescribed Deltasone. Prescribed Arava. Prescribed Plaquenil. Prescribed Zyloprim.   06/26/22 [name], MD [facility]) - Patient presented for Hypokalemia and other concerns. Increased Pantoprazole.    06/02/22 [name], NP - Patient presented for Chronic right shoulder pain and other concerns. No medication changes.    05/27/22 Brand Males, MD (Pulmonology) - Patient presented for ILD and other concerns. Stopped nintedanib    04/30/22 Ralene Ok, MD (Gen Surg) - Patient presented for Stricture of sigmoid colon and other concerns. No medication changes.    04/28/22 [name], MD  (Pulmonology) - Patient presented for Rheumatoid arthritis involving multiple sites with positive rheumatoid factor and other concerns.  No medication changes.   04/14/22 Patient presented to [facility] for Colonoscopy.   04/01/22 Brand Males, MD (Pulmonology) - Patient presented for ILD and other concerns. No medication changes.    03/31/22 [name], NP - Patient presented for Chronic pain syndrome and other concerns. No medication changes.   03/27/22 Swinyer, Lanice Schwab, NP (Cardiology) - Patient presented for Pre-op exam. No medication changes.    03/21/22 [name], MD (Ortho) - Patient presented for Closed nondisplaced fracture of proximal phalynx of right little finger and other concerns. No medication changes.  03/11/22 [name], NP [facility]) - Patient presented for Abnormal CT scan gastro tract and other concerns. No medication changes.   03/06/22 [name], NP (Neurology) - Patient presented for Cognitive decline and other concerns. No  medication changes.    02/28/22 [name], MD- (Ortho) Patient presented for Closed nondisplaced fracture of proximal phalynx of right little finger.  No medication  changes.   02/25/22 [name], [facility] (Pharmacist) - Patient presented for Essential hypertension. No medication changes.    02/14/22 [name], MD (Ortho) - Patient presented for Closed nondisplaced fracture of proximal phalanx of right little finger and other concerns. No medication changes.  Hospital visits: None in previous 6 months   Objective:  Lab Results  Component Value Date   CREATININE 0.91 06/26/2022   BUN 22 06/26/2022   GFR 59.55 (L) 06/26/2022   EGFR 73.0 07/24/2022   GFRNONAA >60 03/01/2022   GFRAA 49 (L) 11/10/2019   NA 145 06/26/2022   K 3.7 06/26/2022   CALCIUM 8.7 06/26/2022   CO2 36 (H) 06/26/2022   GLUCOSE 139 (H) 06/26/2022    Lab Results  Component Value Date/Time   HGBA1C 6.5 (H) 04/02/2021 12:56 AM   HGBA1C 5.6 09/26/2020 01:40 PM   HGBA1C 7.0 (H) 09/22/2019 11:44 AM   GFR 59.55 (L) 06/26/2022 11:50 AM   GFR 63.18 09/25/2021 04:07 PM    Last diabetic Eye exam:  Lab Results  Component Value Date/Time   HMDIABEYEEXA No Retinopathy 02/14/2022 12:00 AM    Last diabetic Foot exam: No results found for: "HMDIABFOOTEX"   Lab Results  Component Value Date   CHOL 136 09/26/2020   HDL 40.00 09/26/2020   LDLCALC 75 09/22/2019   LDLDIRECT 59.0 09/26/2020   TRIG 283.0 (H) 09/26/2020   CHOLHDL 3 09/26/2020       Latest Ref Rng & Units 03/01/2022    2:43 PM 02/01/2022    3:05 PM 11/05/2021   12:44 PM  Hepatic Function  Total Protein 6.5 - 8.1 g/dL 6.1  5.8  5.6   Albumin 3.5 - 5.0 g/dL 3.6  3.7  3.4   AST 15 - 41 U/L 15  15  19    ALT 0 - 44 U/L 21  20  26    Alk Phosphatase 38 - 126 U/L 81  86  66   Total Bilirubin 0.3 - 1.2 mg/dL 0.5  0.8  0.4   Bilirubin, Direct 0.0 - 0.3 mg/dL   0.1     Lab Results  Component Value Date/Time   TSH 1.33 09/25/2021 04:07 PM   TSH 4.598 (H) 01/16/2021 03:37 PM   TSH 3.55 09/26/2020 01:55 PM   FREET4 1.52 05/26/2018 12:59 PM   FREET4 0.91 10/30/2014 05:25 PM       Latest Ref Rng &  Units 03/01/2022    2:43 PM 02/02/2022    6:16 PM 02/01/2022    3:05 PM  CBC  WBC 4.0 - 10.5 K/uL 6.8  7.5  8.0   Hemoglobin 12.0 - 15.0 g/dL 12.0  13.5  13.3   Hematocrit 36.0 - 46.0 % 39.1  43.0  43.7   Platelets 150 - 400 K/uL 144  149  148     Lab Results  Component Value Date/Time   VD25OH 31 04/14/2016 01:08 PM   VITAMINB12 824 03/06/2022 09:00 AM    Clinical ASCVD: No  The ASCVD Risk score (Arnett DK, et al., 2019) failed to calculate for the following reasons:   The 2019 ASCVD risk score is only valid for ages 75 to 51        07/28/2022    1:29 PM 06/02/2022   10:12 AM 02/10/2022   11:39 AM  Depression screen  PHQ 2/9  Decreased Interest 0 0 1  Down, Depressed, Hopeless 0 0 1  PHQ - 2 Score 0 0 2  Tired, decreased energy   2  Change in appetite   0  Feeling bad or failure about yourself    0  Trouble concentrating   0  Moving slowly or fidgety/restless   0  Suicidal thoughts   0  Difficult doing work/chores   Very difficult     Social History   Tobacco Use  Smoking Status Never  Smokeless Tobacco Never   BP Readings from Last 3 Encounters:  07/28/22 (!) 144/84  06/26/22 120/60  06/02/22 (!) 149/86   Pulse Readings from Last 3 Encounters:  07/28/22 96  06/26/22 89  06/02/22 96   Wt Readings from Last 3 Encounters:  07/28/22 223 lb (101.2 kg)  06/26/22 223 lb (101.2 kg)  05/27/22 223 lb 3.2 oz (101.2 kg)   BMI Readings from Last 3 Encounters:  07/28/22 38.28 kg/m  06/26/22 38.28 kg/m  06/02/22 38.31 kg/m    Allergies  Allergen Reactions   Codeine Swelling and Other (See Comments)    Facial swelling Chest pain and swelling in legs     Infliximab Anaphylaxis    (REMICADE) "sent me into shock"    Lisinopril Swelling    Face and neck swelling    Ofev [Nintedanib] Diarrhea    Medications Reviewed Today     Reviewed by Caro Hight, RN (Registered Nurse) on 07/28/22 at [place] List Status: <None>   Medication Order Taking? Sig  Documenting Provider Last Dose Status Informant  acetaminophen (TYLENOL) 500 MG tablet [id number] Yes Take 500-1,000 mg by mouth every 6 (six) hours as needed for moderate pain. [provider] Taking Active Self           Med Note Kenton Kingfisher, Nani Skillern Aug 08, 2021  4:43 PM)    albuterol (PROVENTIL) (2.5 MG/3ML) 0.083% nebulizer solution [id number] Yes Take 3 mLs (2.5 mg total) by nebulization every 6 (six) hours as needed for wheezing. Medina-Vargas, Monina C, NP Taking Active Self  albuterol (VENTOLIN HFA) 108 (90 Base) MCG/ACT inhaler [id number] Yes Inhale 2 puffs into the lungs every 4 (four) hours as needed for wheezing or shortness of breath. [provider] Taking Active Self  allopurinol (ZYLOPRIM) 300 MG tablet [id number] Yes Take 1 tablet (300 mg total) by mouth daily. Medina-Vargas, Monina C, NP Taking Active Self  amLODipine (NORVASC) 10 MG tablet [id number] Yes Take 10 mg by mouth daily. [provider] Taking Active Self  apixaban (ELIQUIS) 5 MG TABS tablet [id number] Yes Take 1 tablet (5 mg total) by mouth 2 (two) times daily. Lorretta Harp, MD Taking Active   Artificial Tear Ointment (DRY EYES OP) [id number] Yes Place 1 drop into both eyes 2 (two) times daily as needed (dry eyes). [provider] Taking Active Self  atorvastatin (LIPITOR) 20 MG tablet [id number] Yes Take 0.5 tablets (10 mg total) by mouth every evening. [name], MD Taking Active Self           Med Note Wilmon Pali, MELISSA R   Mon Apr 07, 2022  4:18 PM)    azithromycin (ZITHROMAX) 250 MG tablet [id number] Yes Take 250 mg by mouth 3 (three) times a week. [provider] Taking Active Self  Calcium Carb-Cholecalciferol (CALCIUM + D3 PO) [id number] Yes Take 2 tablets by mouth daily with lunch.  [provider] Taking Active Self  Cholecalciferol (  VITAMIN D-3) 25 MCG (1000 UT) CAPS [id number] Yes Take 2 capsules (2,000 Units total) by mouth daily. GIVE ONE TAB PO Q DAY  Medina-Vargas, Monina C, NP Taking Active Self  colchicine 0.6 MG tablet [id number] Yes Take 1 tablet (0.6 mg total) by mouth daily as needed (gout flares). Medina-Vargas, Monina C, NP Taking Active Self  cyclobenzaprine (FLEXERIL) 5 MG tablet [id number] Yes Take 1 tablet (5 mg total) by mouth at bedtime as needed for muscle spasms. [name], NP Taking Active Self  diclofenac Sodium (VOLTAREN) 1 % GEL [id number] Yes Apply 2 g topically 3 (three) times daily. Apply to both hands and knees  Patient taking differently: Apply 2 g topically 3 (three) times daily as needed (pain). Apply to both hands and knees   Medina-Vargas, Monina C, NP Taking Active Self  donepezil (ARICEPT) 5 MG tablet [id number] Yes Take 1 tablet (5 mg total) by mouth at bedtime. Melvenia Beam, MD Taking Active Self  Dulaglutide (TRULICITY) [id number] 0000000 Bonney Aid [id number] Yes Inject 0.75 mg into the skin once a week.  Patient taking differently: Inject 0.75 mg into the skin every Tuesday.   [name], MD Taking Active Self  DULoxetine (CYMBALTA) 60 MG capsule [id number] Yes Take 1 capsule (60 mg total) by mouth daily. [name], NP Taking Active Self  fluticasone (FLONASE) 50 MCG/ACT nasal spray [id number] Yes Place 2 sprays into both nostrils daily. [name], MD Taking Active Self  furosemide (LASIX) 80 MG tablet [id number] Yes Take 1 tablet (80 mg total) by mouth daily. Lorretta Harp, MD Taking Active Self  gabapentin (NEURONTIN) 600 MG tablet [id number] Yes Take 2 tablets (1,200 mg total) by mouth 3 (three) times daily. [name], NP Taking Active Self  guaiFENesin (MUCINEX) 600 MG 12 hr tablet [id number] Yes Take 2 tablets (1,200 mg total) by mouth 2 (two) times daily. Medina-Vargas, Monina C, NP Taking Active Self  hydrALAZINE (APRESOLINE) 25 MG tablet [id number] Yes Take 1 tablet (25 mg total) by mouth 2 (two) times daily. Lorretta Harp, MD Taking Active Self  hydroxychloroquine (PLAQUENIL)  200 MG tablet [id number] Yes Take 1 tablet (200 mg total) by mouth daily. Medina-Vargas, Monina C, NP Taking Active Self  leflunomide (ARAVA) 20 MG tablet [id number] Yes Take 1 tablet (20 mg total) by mouth daily. Medina-Vargas, Monina C, NP Taking Active Self  levothyroxine (SYNTHROID) 50 MCG tablet [id number] Yes Take 1 tablet (50 mcg total) by mouth daily. [name], MD Taking Active Self  linaclotide Rolan Lipa) 72 MCG capsule [id number] Yes Take 1 capsule (72 mcg total) by mouth daily before breakfast. [name], MD Taking Active Self  metolazone (ZAROXOLYN) 2.5 MG tablet [id number] Yes TAKE 1 TABLET BY MOUTH DAILY AS NEEDED FOR 2 DAYS FOR WEIGHT ABOVE 255 POUNDS Medina-Vargas, Monina C, NP Taking Active Self  metoprolol succinate (TOPROL-XL) 100 MG 24 hr tablet [id number] Yes TAKE 1 TABLET BY MOUTH DAILY ALONG WITH 25 MG TABLET Lorretta Harp, MD Taking Active   metoprolol succinate (TOPROL-XL) 25 MG 24 hr tablet [id number] Yes Take 5 tablets (125 mg total) by mouth daily.  Patient taking differently: Take 25 mg by mouth daily.   [name], MD Taking Active Self  morphine (MS CONTIN) 15 MG 12 hr tablet [id number] Yes Take 1 tablet (15 mg total) by mouth every 12 (twelve) hours. [name], NP Taking Active            Med Note [facility],  SYBIL W   Mon Jul 28, 2022  1:28 PM) Fill date 06/21/22 #60 today #3 last taken today  morphine (MSIR) 15 MG tablet [id number] Yes Take 1 tablet (15 mg total) by mouth 2 (two) times daily. [name], NP Taking Active            Med Note Caro Hight   Mon Jul 28, 2022  1:29 PM) Fill date 06/21/22 #60 today #9 last taken today  Multiple Vitamin (MULTIVITAMIN WITH MINERALS) TABS [id number] Yes Take 1 tablet by mouth daily.  [provider] Taking Active Self  olmesartan (BENICAR) 40 MG tablet [id number] Yes Take 1 tablet (40 mg total) by mouth daily. [name], MD Taking Active Self  ondansetron (ZOFRAN-ODT) 4 MG  disintegrating tablet [id number] Yes Take 1 tablet (4 mg total) by mouth every 8 (eight) hours as needed for nausea or vomiting. [name], MD Taking Active   OXYGEN [id number] Yes Inhale 3 L into the lungs See admin instructions. 3 L at bedtime, and 3 L during the day as needed for exertion [provider] Taking Active Self  pantoprazole (PROTONIX) 40 MG tablet [id number] Yes Take 1 tablet (40 mg total) by mouth 2 (two) times daily. take 1 tablet by mouth once daily MINUTES BEFORE 1ST MEAL OF THE DAY [name], MD Taking Active   Potassium Chloride ER 20 MEQ TBCR [id number] Yes Take 20 mEq by mouth daily. [name], MD Taking Active   predniSONE (DELTASONE) 10 MG tablet [id number] Yes Take 1 tablet (10 mg total) by mouth daily with breakfast. [name], MD Taking Active Self  Med List Note [name], NP 06/02/18 1129): [facility] [place]            SDOH:  (Social Determinants of Health) assessments and interventions performed: Yes SDOH Interventions    Flowsheet Row Telephone from 05/28/2022 in [place] Telephone from 05/27/2022 in [place] from 10/23/2021 in [place] at [place] from 10/18/2020 in [place] at [place] from 01/05/2015 in [facility]  SDOH Interventions       Food Insecurity Interventions -- -- Intervention Not Indicated Intervention Not Indicated --  Housing Interventions -- Intervention Not Indicated Intervention Not Indicated Intervention Not Indicated --  Transportation Interventions SCAT ([employer])  [Mailed SCAT application to home address.] Other (Comment), SCAT ([employer])  Financial planner to careguide for  SCATs application as a back up source for transportation] Intervention Not Indicated Intervention Not Indicated --  Utilities Interventions -- Intervention Not Indicated -- -- --  Depression Interventions/Treatment  -- -- -- -- Currently on Treatment  Financial Strain Interventions -- -- Intervention Not Indicated Intervention Not Indicated --  Physical Activity Interventions -- -- Intervention Not Indicated -- --  Stress Interventions -- -- Intervention Not Indicated Intervention Not Indicated --  Social Connections Interventions -- -- Intervention Not Indicated Intervention Not Indicated --       Medication Assistance: None required.  Patient affirms current coverage meets needs.  Medication Access: Within the past 30 days, how often has patient missed a dose of medication? None Is a pillbox or other method used to improve adherence? No  Factors that may affect medication adherence? no barriers identified Are meds synced by current pharmacy? No  Are meds delivered by current pharmacy? Yes  Does patient experience delays in picking up medications due to transportation concerns? No   Upstream Services Reviewed: Is patient disadvantaged to use [facility] Pharmacy?: Yes  Current Rx insurance plan: [facility] Name and location of Current pharmacy:  [facility] [place], [place] [address] [facility] [address] [place] [address] Phone: [phone] Fax: [phone]  [facility] [place], [place] [address] [place] [address] [place] [address] Phone: [phone] Fax: [phone]  [facility] [place], [place] [address] [place] [address] Phone: [phone] Fax: [phone]  [facility] Pharmacy services reviewed with patient today?: No  Patient requests to transfer care to [facility] Pharmacy?: No   Reason patient declined to change pharmacies: Disadvantaged due to insurance/mail order and Receives medications through [place]  Compliance/Adherence/Medication fill history: Care Gaps: Diabetic Urine - Overdue Zoster Vaccine - Overdue HGB A1C - Overdue COVID Booster - Overdue AWV - 11/22/21  Star-Rating Drugs: Atorvastatin 20 mg - Last filled 08/06/22 30 DS at [facility] Trulicity [id number] mg - Last filled 05/31/21 28 DS at [facility]   Assessment/Plan   Hypertension (BP goal <140/90) -Controlled -Current treatment: Amlodipine 10mg  1 qd Appropriate, Effective, Safe, Accessible Hydralazine 25mg  BID Appropriate, Effective, Safe, Accessible Olmesartan 40mg  1 qd Appropriate, Effective, Safe, Accessible -Medications previously tried: None  -Current home readings: 120-130s/70-80s, 130/85 today -Current dietary habits: mindful of salt intake -Current exercise habits: Not discussed -Denies hypotensive/hypertensive symptoms -Educated on BP goals and benefits of medications for prevention of heart attack, stroke and kidney damage; Daily salt intake goal < 2300 mg; Importance of home blood pressure monitoring; Proper BP monitoring technique; -Counseled to monitor BP at home 2-3x/week, document, and provide log at future appointments -Recommended to continue current medication  Hyperlipidemia: (LDL goal < 70, TG <150) -Uncontrolled -Current treatment: Atorvastatin 20mg  1/2 tab qd Appropriate, Effective, Safe, Accessible -Medications previously tried: None  -Current dietary patterns: not discussed -Current exercise habits: not discussed -Educated on Cholesterol goals;  Benefits of statin for ASCVD risk reduction; Importance of limiting foods high in cholesterol; -Recommended to continue current medication - requesting rx for Lipitor 10mg  as patient is having a hard time splitting the tabs. -ORDER updated lipid panel at next OV in April, with PCP approval  Diabetes (A1c goal  <7%) -Controlled -Current medications: Trulicity 0.75mg  every week Appropriate, Effective, Safe, Query Accessible (patient states [facility] can no longer receive) -Medications previously tried: Ozempic  (switched due to supply issues previously) -Current home glucose readings fasting glucose: checking about once a week, always in 93-110 -Denies hypoglycemic/hyperglycemic symptoms -Current meal patterns:  Not discussed -Current exercise: Not discussed -Educated on A1c and blood sugar goals; Complications of diabetes including kidney damage, retinal damage, and cardiovascular disease; Continuous glucose monitoring; -Counseled to check feet daily and get yearly eye exams -Recommended ozempic to replace trulicity, due to supply issues. Patient tolerated well in the past, sample provided in office today. Patient gives herself the injection every Tuesday.  Heart Failure (Goal: manage symptoms and prevent exacerbations) -Not assessed today -Last ejection fraction: 55-60% (Date: 11/06/21) -HF type: HFpEF (EF > 50%) -NYHA Class: III (marked limitation of activity) -AHA HF Stage: C (Heart disease and symptoms present) -Current treatment: Furosemide 80mg  1 qd Appropriate, Effective, Safe, Accessible Metolazone 2.5mg  1 tab for 2 days  prn for weight >255 Appropriate, Effective, Safe, Accessible Metoprolol XL 100mg  + Metoprolol XL 25mg  1 qd each Appropriate, Effective, Safe, Accessible Potassium Chloride ER 20 mE 1 qd Appropriate, Effective, Safe, Accessible  ILD (Goal: control symptoms and prevent exacerbations) -Not assessed today -Current treatment  Albuterol 0.083% neb solution q6h prn Appropriate, Effective, Safe, Accessible Albuterol HFA 2 puffs q4h prn Appropriate, Effective, Safe, Accessible Flonase 2 sprays into both nostrils daily Appropriate, Effective, Safe, Accessible Oxygen 3L at night Appropriate, Effective, Safe, Accessible Azithromycin 250mg  MWF prophylaxis Appropriate, Effective,  Safe, Accessible  Osteoporosis / Osteopenia (Goal Prevent bone fracture and bone loss) -Not assessed today -Last DEXA Scan: 06/05/2011   T-Score femoral neck: -1.2  10-year probability of major osteoporotic fracture: 7.2%  10-year probability of hip fracture: 0.8% -Patient is not a candidate for pharmacologic treatment -Current treatment  Calcium + Vit D -Medications previously tried: None  -ORDER an updated DEXA scan, with PCP approval  Gout (Goal: Prevent gout flares) -Not assessed today -Current treatment  Colchicine 0.6mg  prn Appropriate, Effective, Safe, Accessible Allopurinol 300mg  1 qd Appropriate, Effective, Safe, Accessible  Chronic Kidney Disease Stage 3a  -All medications assessed for renal dosing and appropriateness in chronic kidney disease.   Hypothyroidism (Goal TSH: 0.4-4.5 -Not assessed today -Current treatment: Levothyroxine 13mcg 1 qd -Counseled to take medication by itself, first thing in the morning, at least 30 min to 1 hr before food or other meds -Recommended to continue current medication  Chronic Pain/RA/Drug-Induced Constipation (Goal: Regular BM and pain level that still allows for ADLs) -Not assessed today -Current treatment  Enbrel Leflunomide 20mg  1 qd Plaquenil 200mg  1 qd Linzess 72mcg 1 qd MS Contin 15mg  1 q12 h Morphine IR 15mg  1 BID Prednisone 10mg  1 qd with breakfast Gabapentin 600mg  2 tabs TID Cymbalta 60mg  1 qd Flexeril 5mg  1 qhs prn  Chronic Anticoagulation (Goal: prevent stroke, clot and major bleeding) -Not assessed today -Current treatment: Eliquis 5mg  BID Appropriate, Effective, Safe, Accessible   GERD (Goal: minimize symptoms of reflux ) -Not assessed today -Current treatment  Pantoprazole 40mg  BID Appropriate, Effective, Safe, Accessible   Maren Reamer Clinical Pharmacist [phone]

## 2022-08-13 ENCOUNTER — Ambulatory Visit: Payer: Medicare Other

## 2022-08-14 ENCOUNTER — Other Ambulatory Visit: Payer: Self-pay | Admitting: Family Medicine

## 2022-08-14 ENCOUNTER — Telehealth: Payer: Self-pay

## 2022-08-14 ENCOUNTER — Other Ambulatory Visit: Payer: Self-pay | Admitting: Cardiovascular Disease

## 2022-08-14 DIAGNOSIS — E782 Mixed hyperlipidemia: Secondary | ICD-10-CM

## 2022-08-14 DIAGNOSIS — I5032 Chronic diastolic (congestive) heart failure: Secondary | ICD-10-CM

## 2022-08-14 DIAGNOSIS — K5909 Other constipation: Secondary | ICD-10-CM

## 2022-08-14 DIAGNOSIS — E1140 Type 2 diabetes mellitus with diabetic neuropathy, unspecified: Secondary | ICD-10-CM

## 2022-08-14 DIAGNOSIS — M810 Age-related osteoporosis without current pathological fracture: Secondary | ICD-10-CM

## 2022-08-14 MED ORDER — LINACLOTIDE 72 MCG PO CAPS: 72.0000 ug | ORAL_CAPSULE | Freq: Every day | ORAL | 3 refills | Status: DC

## 2022-08-14 MED ORDER — OZEMPIC (0.25 OR 0.5 MG/DOSE) 2 MG/1.5ML ~~LOC~~ SOPN: 0.5000 mg | PEN_INJECTOR | SUBCUTANEOUS | 3 refills | Status: DC

## 2022-08-14 MED ORDER — ATORVASTATIN CALCIUM 10 MG PO TABS: 10.0000 mg | ORAL_TABLET | Freq: Every day | ORAL | 3 refills | Status: DC

## 2022-08-14 NOTE — Telephone Encounter (Signed)
-----   Message from Maren Reamer, [facility] sent at 08/13/2022  8:02 PM EDT ----- Regarding: 90DS Med Refill to [place], DEXA Request Pt requesting a 90DS with refills be sent to her preferred pharmacy for the following medications:  Atorvastatin 10mg  once daily (currently has 20mg  and having to split, having issues with that) Linzess 41mcg once daily (out of medication) Ozempic 0.25/0.5mg  pen - 0.5mg  once weekly - replacing Trulicity - new start (samples provided in office)  Also requesting an updated DEXA scan and needs an order placed.  Preferred Pharmacy: [facility] [place], [place] - [address]  Phone: [phone] Fax: [phone]   Thank you! [place] Pharmacist [phone]

## 2022-08-14 NOTE — Telephone Encounter (Signed)
Requested meds refilled and sent to mail order pharmacy.  DEXA scan ordered.

## 2022-08-19 ENCOUNTER — Other Ambulatory Visit: Payer: Self-pay | Admitting: Cardiovascular Disease

## 2022-08-19 DIAGNOSIS — I10 Essential (primary) hypertension: Secondary | ICD-10-CM

## 2022-08-20 NOTE — Telephone Encounter (Signed)
Patient notified of prescriptions and test ordered

## 2022-09-01 ENCOUNTER — Ambulatory Visit: Payer: Medicare Other | Admitting: Family Medicine

## 2022-09-02 ENCOUNTER — Telehealth: Payer: Self-pay | Admitting: Cardiovascular Disease

## 2022-09-02 DIAGNOSIS — I2699 Other pulmonary embolism without acute cor pulmonale: Secondary | ICD-10-CM

## 2022-09-02 MED ORDER — APIXABAN 5 MG PO TABS: 5.0000 mg | ORAL_TABLET | Freq: Two times a day (BID) | ORAL | 1 refills | Status: DC

## 2022-09-02 NOTE — Telephone Encounter (Signed)
Pt last saw Eligha Bridegroom, NP on 03/27/22, last labs 07/23/22 Creat 0.81, age 82, weight 101.2kg, based on specified criteria pt is on appropriate dosage of Eliquis  BID for DVT.  Will refill rx.

## 2022-09-02 NOTE — Telephone Encounter (Signed)
*  STAT* If patient is at the pharmacy, call can be transferred to refill team.   1. Which medications need to be refilled? (please list name of each medication and dose if known)   apixaban (ELIQUIS) 5 MG TABS tablet    2. Which pharmacy/location (including street and city if local pharmacy) is medication to be sent to?  [facility] - [place], [place] - [address]    3. Do they need a 30 day or 90 day supply? 90 day

## 2022-09-03 ENCOUNTER — Ambulatory Visit (INDEPENDENT_AMBULATORY_CARE_PROVIDER_SITE_OTHER): Payer: Medicare Other | Admitting: Family Medicine

## 2022-09-03 VITALS — BP 132/76 | HR 93 | Temp 98.0°F

## 2022-09-03 DIAGNOSIS — M25511 Pain in right shoulder: Secondary | ICD-10-CM

## 2022-09-03 DIAGNOSIS — M05741 Rheumatoid arthritis with rheumatoid factor of right hand without organ or systems involvement: Secondary | ICD-10-CM | POA: Diagnosis not present

## 2022-09-03 DIAGNOSIS — I5032 Chronic diastolic (congestive) heart failure: Secondary | ICD-10-CM

## 2022-09-03 DIAGNOSIS — Z7985 Long-term (current) use of injectable non-insulin antidiabetic drugs: Secondary | ICD-10-CM

## 2022-09-03 DIAGNOSIS — G8929 Other chronic pain: Secondary | ICD-10-CM | POA: Diagnosis not present

## 2022-09-03 DIAGNOSIS — M19011 Primary osteoarthritis, right shoulder: Secondary | ICD-10-CM

## 2022-09-03 DIAGNOSIS — I10 Essential (primary) hypertension: Secondary | ICD-10-CM

## 2022-09-03 DIAGNOSIS — E039 Hypothyroidism, unspecified: Secondary | ICD-10-CM

## 2022-09-03 DIAGNOSIS — E1140 Type 2 diabetes mellitus with diabetic neuropathy, unspecified: Secondary | ICD-10-CM

## 2022-09-03 DIAGNOSIS — J849 Interstitial pulmonary disease, unspecified: Secondary | ICD-10-CM

## 2022-09-03 DIAGNOSIS — M05742 Rheumatoid arthritis with rheumatoid factor of left hand without organ or systems involvement: Secondary | ICD-10-CM | POA: Diagnosis not present

## 2022-09-03 LAB — TSH: TSH: 3.18 u[IU]/mL (ref 0.35–5.50)

## 2022-09-03 LAB — HEMOGLOBIN A1C: Hgb A1c MFr Bld: 5.8 % (ref 4.6–6.5)

## 2022-09-03 MED ORDER — LEVOTHYROXINE SODIUM 50 MCG PO TABS: 50.0000 ug | ORAL_TABLET | Freq: Every day | ORAL | 3 refills | Status: DC

## 2022-09-03 NOTE — Progress Notes (Signed)
Established Patient Office Visit   Subjective  Patient ID: [name], female    DOB: 1941/03/05  Age: 82 y.o. MRN: [id number]  Chief Complaint  Patient presents with   Follow-up    Rt arm pain, daily. Has gotten worse, goes down the whole arm from shoulder.   Patient accompanied by her daughter .  Patient is an 82 year old female with pmh sig for chronic CHF, HTN, rheumatoid arthritis, osteoarthritis, ILD, HLD, history of DVT and PE on chronic anticoagulation, GERD, constipation, DM2, hypothyroidism, neuropathy, lumbar spinal stenosis with radiculopathy, CKD stage III who was seen for follow-up.  Patient states she is feeling better overall.  Using O2 mostly in the morning after getting dressed.  Will try to go without oxygen throughout the day.  Endorses continued/worsening right shoulder pain.  Seen by Ortho.  Had 1 injection in shoulder.  Using lidocaine patches, morphine at max.  Started Enbrel a few weeks ago with rheumatology.  Thinks medication may be helping RA in hands.  Difficulty obtaining medication from [place] in December.  Now receiving correct medications.  May need refill on Synthroid.   Patient Active Problem List   Diagnosis Date Noted   Abnormal CT scan, sigmoid colon 04/14/2022   Colon stricture 04/14/2022   Diverticulosis of colon without hemorrhage 04/14/2022   Pulmonary embolism 11/15/2021   Tachycardia 10/22/2021   Pulmonary HTN    Unspecified protein-calorie malnutrition 04/05/2021   Pressure injury of skin 03/31/2021   UTI (urinary tract infection) 03/31/2021   Sepsis due to urinary tract infection 03/30/2021   Acute encephalopathy 03/30/2021   Thrombocytopenia 03/30/2021   Diabetic neuropathy 05/24/2019   Hypokalemia 04/21/2019   Anticoagulated 06/25/2018   Chronic pain 06/25/2018   CRI (chronic renal insufficiency), stage 3 (moderate) 06/25/2018   Drug-induced constipation 06/18/2018   DVT (deep venous thrombosis) 06/03/2018   Primary  osteoarthritis of both knees 05/26/2018   Body mass index (BMI) of 50-59.9 in adult 05/26/2018   Acquired hypothyroidism 05/26/2018   Chronic bilateral low back pain without sciatica 02/17/2018   Astigmatism with presbyopia, bilateral 03/26/2017   Cortical age-related cataract of both eyes 03/26/2017   Family history of glaucoma 03/26/2017   Long term current use of oral hypoglycemic drug 03/26/2017   Nuclear sclerotic cataract of both eyes 03/26/2017   Pain in left hip 02/18/2017   Closed nondisplaced intertrochanteric fracture of left femur with delayed healing 01/14/2017   Gout 11/29/2016   Depression 11/29/2016   Closed left hip fracture 11/29/2016   GERD (gastroesophageal reflux disease) 11/29/2016   Closed intertrochanteric fracture of hip, left, initial encounter 11/29/2016   Physical deconditioning 07/31/2015   Pulmonary fibrosis, postinflammatory 07/31/2015   Bronchiectasis without complication 07/31/2015   Tendinitis of left rotator cuff 06/13/2015   Chronic diastolic CHF (congestive heart failure) 04/21/2015   Acute kidney injury superimposed on chronic kidney disease 04/17/2015   Chronic respiratory failure with hypoxia 02/09/2015   Dyspnea and respiratory abnormality 01/03/2015   Normal coronary arteries 11/02/2014   Morbid obesity 10/30/2014   Dysphagia 08/21/2014   Chronic fatigue 06/21/2014   DOE (dyspnea on exertion) 05/24/2014   Primary osteoarthritis of left knee 04/26/2014   High risk medication use 04/17/2014   Aphasia 02/12/2014   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 01/18/2014   Lumbar facet arthropathy 01/18/2014   Bilateral edema of lower extremity 05/30/2013   Chronic cough 05/24/2013   ILD (interstitial lung disease) 04/10/2013   Spinal stenosis of lumbar region with radiculopathy 07/12/2012  Inability to walk 07/12/2012   Meningioma of left sphenoid wing involving cavernous sinus 02/17/2012   Chest pain of uncertain etiology 01/28/2012    Rheumatoid arthritis 01/28/2012   Primary osteoarthritis of right knee 01/28/2012   Dyslipidemia    Thyroid disease    Essential hypertension    Past Surgical History:  Procedure Laterality Date   BRAIN SURGERY     Gamma knife 10/13. Needs repeat spring  '14   CARDIAC CATHETERIZATION N/A 10/31/2014   Procedure: Right/Left Heart Cath and Coronary Angiography;  Surgeon: Corky Crafts, MD;  Location: [facility] INVASIVE CV LAB;  Service: Cardiovascular;  Laterality: N/A;   COLONOSCOPY WITH PROPOFOL N/A 04/14/2022   Procedure: COLONOSCOPY WITH PROPOFOL;  Surgeon: Beverley Fiedler, MD;  Location: [facility] ENDOSCOPY;  Service: Gastroenterology;  Laterality: N/A;   CYST EXCISION     2 on face   CYST REMOVAL NECK     ESOPHAGOGASTRODUODENOSCOPY (EGD) WITH PROPOFOL N/A 09/14/2014   Procedure: ESOPHAGOGASTRODUODENOSCOPY (EGD) WITH PROPOFOL;  Surgeon: Louis Meckel, MD;  Location: [facility] ENDOSCOPY;  Service: Endoscopy;  Laterality: N/A;   INCISION AND DRAINAGE HIP Left 01/16/2017   Procedure: IRRIGATION AND DEBRIDEMENT LEFT HIP;  Surgeon: Kathryne Hitch, MD;  Location: [facility] ORS;  Service: Orthopedics;  Laterality: Left;   INTRAMEDULLARY (IM) NAIL INTERTROCHANTERIC Left 11/29/2016   Procedure: INTRAMEDULLARY (IM) NAIL INTERTROCHANTRIC;  Surgeon: Kathryne Hitch, MD;  Location: [facility] OR;  Service: Orthopedics;  Laterality: Left;   OVARY SURGERY     RIGHT HEART CATH N/A 08/12/2021   Procedure: RIGHT HEART CATH;  Surgeon: Runell Gess, MD;  Location: [facility] INVASIVE CV LAB;  Service: Cardiovascular;  Laterality: N/A;   SHOULDER SURGERY Left    TONSILLECTOMY  age 36   VAGINAL HYSTERECTOMY     VIDEO BRONCHOSCOPY Bilateral 05/31/2013   Procedure: VIDEO BRONCHOSCOPY WITHOUT FLUORO;  Surgeon: Kalman Shan, MD;  Location: [facility] ENDOSCOPY;  Service: Cardiopulmonary;  Laterality: Bilateral;   video bronscoscopy  05/19/1998   lung   Social History   Tobacco Use   Smoking status: Never   Smokeless  tobacco: Never  Vaping Use   Vaping Use: Never used  Substance Use Topics   Alcohol use: No   Drug use: No   Family History  Problem Relation Age of Onset   Diabetes Mother    Heart attack Mother    Hypertension Father    Lung cancer Father    Diabetes Sister    Breast cancer Sister    Breast cancer Sister    Diabetes Brother    Hypertension Brother    Heart disease Brother    Heart attack Brother    Kidney cancer Brother    Gout Brother    Kidney failure Brother        x 5   Esophageal cancer Brother    Stomach cancer Brother    Prostate cancer Brother    Uterine cancer Daughter        with mets   Fibroids Daughter    Hypertension Daughter    Hypertension Daughter    Hypertension Son    Heart murmur Son    Rheum arthritis Maternal Uncle    Alzheimer's disease Neg Hx    Dementia Neg Hx    Allergies  Allergen Reactions   Codeine Swelling and Other (See Comments)    Facial swelling Chest pain and swelling in legs     Infliximab Anaphylaxis    (REMICADE) "sent me into shock"  Lisinopril Swelling    Face and neck swelling    Ofev [Nintedanib] Diarrhea      ROS Negative unless stated above    Objective:     BP 132/76 (BP Location: Left Arm, Patient Position: Sitting, Cuff Size: Large)   Pulse 93   Temp 98 F (36.7 C) (Oral)   SpO2 92%  BP Readings from Last 3 Encounters:  09/03/22 132/76  08/13/22 130/85  07/28/22 (!) 144/84   Wt Readings from Last 3 Encounters:  07/28/22 223 lb (101.2 kg)  06/26/22 223 lb (101.2 kg)  05/27/22 223 lb 3.2 oz (101.2 kg)      Physical Exam Constitutional:      General: She is not in acute distress.    Appearance: Normal appearance.  HENT:     Head: Normocephalic and atraumatic.     Nose: Nose normal.     Mouth/Throat:     Mouth: Mucous membranes are moist.  Eyes:     Extraocular Movements: Extraocular movements intact.     Conjunctiva/sclera: Conjunctivae normal.  Cardiovascular:     Rate and  Rhythm: Normal rate and regular rhythm.     Heart sounds: Normal heart sounds. No murmur heard.    No gallop.     Comments: No pitting edema in b/l LEs Pulmonary:     Effort: Pulmonary effort is normal. No respiratory distress.     Breath sounds: Normal breath sounds. No wheezing, rhonchi or rales.     Comments: On RA. Abdominal:     General: Bowel sounds are normal.     Palpations: Abdomen is soft.  Skin:    General: Skin is warm and dry.  Neurological:     Mental Status: She is alert and oriented to person, place, and time. Mental status is at baseline.     No results found for any visits on 09/03/22.    Assessment & Plan:  Acquired hypothyroidism -Continue Synthroid 50 mcg daily -     TSH -     Levothyroxine Sodium; Take 1 tablet (50 mcg total) by mouth daily.  Dispense: 90 tablet; Refill: 3  Primary osteoarthritis of right shoulder  ILD (interstitial lung disease) -Severe but stable -Continue current medications including albuterol inhaler and nebulizer solution -Continue follow-up with pulmonology  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 -     Hemoglobin A1c -     Microalbumin / creatinine urine ratio  Rheumatoid arthritis involving both hands with positive rheumatoid factor -Continue follow-up rheumatology -Continue current medications including Plaquenil, Enbrel  Essential hypertension -Controlled -Continue current medications including Norvasc 10 mg daily, Lasix 80 mg daily hydralazine 25 mg twice daily Toprol XL 125 mg daily, olmesartan 40 mg daily -Continue lifestyle modifications  Chronic diastolic CHF (congestive heart failure) -Euvolemic -Continue current medications including Lasix 80 mg daily, Toprol XL 125 mg daily, olmesartan 40 mg daily -Continue follow-up with cards  Chronic right shoulder pain -Continue chronic pain meds MS Contin 15 mg every 12. -Topical medications as needed -Continue follow-up with pain management   Return in  about 6 months (around 03/05/2023), or if symptoms worsen or fail to improve.   [name], MD

## 2022-09-08 ENCOUNTER — Encounter: Payer: Self-pay | Admitting: Neurology

## 2022-09-08 ENCOUNTER — Ambulatory Visit (INDEPENDENT_AMBULATORY_CARE_PROVIDER_SITE_OTHER): Payer: Medicare Other | Admitting: Neurology

## 2022-09-08 VITALS — BP 139/82 | HR 86 | Ht 64.0 in | Wt 229.6 lb

## 2022-09-08 DIAGNOSIS — G3184 Mild cognitive impairment, so stated: Secondary | ICD-10-CM | POA: Diagnosis not present

## 2022-09-08 MED ORDER — DONEPEZIL HCL 10 MG PO TABS: 10.0000 mg | ORAL_TABLET | Freq: Every day | ORAL | 4 refills | Status: DC

## 2022-09-08 NOTE — Patient Instructions (Signed)
Reach out GI doctor to make sure Aricept is ok Check again in 6 months and not do anything at this time MMSE improved

## 2022-09-08 NOTE — Progress Notes (Signed)
[facility]    Provider:  Dr Lucia Gaskins Requesting Provider: [name], MD Primary Care Provider:  [name], MD  CC: memory changes  September 08, 2022: Patient is here with daughter today.  Daughter was not at last appointment so we reviewed appointment, I reviewed her MRI with daughter showed her the moderate white matter changes and the microhemorrhages which are more likely from hypertension, I do not think patient has Alzheimer's, in fact her MMSE today is better than it was last time we took it, she has no history of Alzheimer's, at this point we decided to monitor her every 6 months and if we do see that there is a decline then we can evaluate further but patient says she is doing absolutely fabulous.  Patient complains of symptoms per HPI as well as the following symptoms: none . Pertinent negatives and positives per HPI. All others negative   HPI:  [name] is a 82 y.o. female here as requested by [name], MD for memory changes. 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 She feels like her short-term memory is impaired. Son provides much information. It is her struggling with short-term memory and association, associating what she reads and understands and how to execute, she does well for someone over 38 with computers, she filled it out online, she used to be a Engineer, civil (consulting), she gets frustrated, the brain tumor causes a lot of levels of anxiety even though they shrunk it she has remnants, one thing that is concerning is the changes in her mediine, she manages her own medications at home son doesn't even need to help her, she can do a lot on her own, her personal hygiene is good, she looks good, she gets frustrated with her husband his life is simple but patient is still very social and independent and she has a "traffic jam" in the brain. Anxiety. No FHx of dementia. No other focal neurologic cognitive deficits, associated symptoms, inciting events or modifiable factors.   Reviewed notes, labs and imaging from outside physicians, which showed:  09/2021: TSh normal  02/2022; CBC unremarkable except platelets 144 CMP low potassium 2.8, co2 34 and elevated glucose but nml kidney BUN 14 and creat 0.72, decreased total protein 6.1.  I reviewed emergency room notes from September: Patient was recently in the emergency room in September 2023 after a fall on blood thinners, she was walking to the bathroom and fell and hit her head in a jewelry box, not sure why she fell but she has an extensive history as above in a very complicated patient.  No loss of consciousness.  She is on Eliquis.  Prior to this she was seen the day before same month for an episode of word finding difficulty, her labs were consistent with a  UTI and she was started on Keflex.  MRI was negative for acute stroke.  CT after the fall was negative for intracranial hemorrhage.  Labs are overall reassuring and she was started on Keflex for UTI.  MRi brain 02/01/2022: EXAM: MRI HEAD WITHOUT CONTRAST   TECHNIQUE: Multiplanar, multiecho pulse sequences of the brain and surrounding structures were obtained without intravenous contrast.   COMPARISON:  02/18/2014   FINDINGS: Brain: No acute infarct, mass effect or extra-axial collection. Multifocal peripheral predominant bilateral chronic microhemorrhage. There is confluent hyperintense T2-weighted signal within the white matter. Parenchymal volume and CSF spaces are normal. The midline structures are normal.   Vascular: Major flow voids are preserved.   Skull and upper cervical spine: Normal calvarium and skull base. Visualized upper cervical spine and soft tissues are normal.   Sinuses/Orbits:No paranasal sinus fluid levels or advanced mucosal thickening. No mastoid or middle ear effusion. Normal orbits.   IMPRESSION: 1. No acute intracranial abnormality. 2. Advanced chronic small vessel ischemic disease. 3. Multifocal peripheral predominant bilateral chronic microhemorrhage may indicate cerebral amyloid angiopathy.   CT head 02/08/2022: EXAM: CT HEAD WITHOUT CONTRAST   TECHNIQUE: Contiguous axial images were obtained from the base of the skull through the vertex without intravenous contrast.   RADIATION DOSE REDUCTION: This exam was performed according to the departmental dose-optimization program which includes automated exposure control, adjustment of the mA and/or kV according to patient size and/or use of iterative reconstruction technique.   COMPARISON:  MRI head 02/18/2014, CT head 02/01/2022, CT head 02/02/2022 3:16 a.m.   BRAIN: BRAIN Patchy and confluent areas of decreased attenuation are noted throughout the deep and periventricular white matter of the  cerebral hemispheres bilaterally, compatible with chronic microvascular ischemic disease.   No evidence of large-territorial acute infarction. No parenchymal hemorrhage. No mass lesion. No extra-axial collection.   No mass effect or midline shift. No hydrocephalus. Basilar cisterns are patent.   Vascular: No hyperdense vessel.   Skull: No acute fracture or focal lesion.   Sinuses/Orbits: Paranasal sinuses and mastoid air cells are clear. Right lens replacement. Otherwise the orbits are unremarkable.   Other: None.   IMPRESSION: No acute intracranial abnormality.  Review of Systems: Patient complains of symptoms per HPI as well as the following symptoms memory changes. Pertinent negatives and positives per HPI. All others negative.   Social History   Socioeconomic History   Marital status: Married    Spouse name: Not on file   Number of children: 6   Years of education: college   Highest education level: Not on file  Occupational History   Occupation: retired Engineer, civil (consulting)  Tobacco Use   Smoking status: Never   Smokeless tobacco: Never  Vaping Use   Vaping Use: Never used  Substance and Sexual Activity   Alcohol use: No   Drug use: No   Sexual activity: Not Currently  Other Topics Concern   Not on file  Social History Narrative   Patient consumes 2-3 cups coffee per day.   Social Determinants of Health   Financial Resource Strain: Low Risk  (10/23/2021)   Overall Financial Resource Strain (CARDIA)    Difficulty of Paying Living Expenses: Not hard at all  Food Insecurity: No Food Insecurity (08/13/2022)   Hunger Vital Sign    Worried About Running Out of Food in  the Last Year: Never true    Ran Out of Food in the Last Year: Never true  Transportation Needs: Unmet Transportation Needs (05/28/2022)   PRAPARE - Administrator, Civil Service (Medical): Yes    Lack of Transportation (Non-Medical): No  Physical Activity: Insufficiently Active (10/23/2021)    Exercise Vital Sign    Days of Exercise per Week: 2 days    Minutes of Exercise per Session: 40 min  Stress: No Stress Concern Present (10/18/2020)   [employer] of Occupational Health - Occupational Stress Questionnaire    Feeling of Stress : Not at all  Social Connections: Socially Integrated (10/23/2021)   Social Connection and Isolation Panel [NHANES]    Frequency of Communication with Friends and Family: More than three times a week    Frequency of Social Gatherings with Friends and Family: More than three times a week    Attends Religious Services: More than 4 times per year    Active Member of [employer] or Organizations: Yes    Attends Engineer, structural: More than 4 times per year    Marital Status: Married  Catering manager Violence: Not At Risk (10/23/2021)   Humiliation, Afraid, Rape, and Kick questionnaire    Fear of Current or Ex-Partner: No    Emotionally Abused: No    Physically Abused: No    Sexually Abused: No    Family History  Problem Relation Age of Onset   Diabetes Mother    Heart attack Mother    Hypertension Father    Lung cancer Father    Diabetes Sister    Breast cancer Sister    Breast cancer Sister    Diabetes Brother    Hypertension Brother    Heart disease Brother    Heart attack Brother    Kidney cancer Brother    Gout Brother    Kidney failure Brother        x 5   Esophageal cancer Brother    Stomach cancer Brother    Prostate cancer Brother    Uterine cancer Daughter        with mets   Fibroids Daughter    Hypertension Daughter    Hypertension Daughter    Hypertension Son    Heart murmur Son    Rheum arthritis Maternal Uncle    Alzheimer's disease Neg Hx    Dementia Neg Hx     Past Medical History:  Diagnosis Date   Acute on chronic diastolic (congestive) heart failure    Anemia    iron deficiency anemia - secondary to blood loss ( chronic)    Anxiety    Arthritis    endstage changes bilateral knees/bilateral ankles.     Asthma    Carotid artery occlusion    Chronic fatigue    Chronic kidney disease    Closed left hip fracture    Clotting disorder    pt denies this   Contusion of left knee    due to fall 1/14.   COPD (chronic obstructive pulmonary disease)    pulmonary fibrosis   Depression, reactive    Diabetes mellitus    type II    Diastolic dysfunction    Difficulty in walking    Family history of heart disease    Generalized muscle weakness    Gout    High cholesterol    History of falling    Hypertension    Hypothyroidism    Interstitial lung disease  Meningioma of left sphenoid wing involving cavernous sinus 02/17/2012   Continue diplopia, left eye pain and left headaches.     Morbid obesity    Neuromuscular disorder    diabetic neuropathy    Normal coronary arteries    cardiac catheterization performed  10/31/14   Pneumonia    RA (rheumatoid arthritis)    has been off methotreaxte since 10/13.   Spinal stenosis of lumbar region    Thyroid disease    Unspecified lack of coordination    URI (upper respiratory infection)     Patient Active Problem List   Diagnosis Date Noted   [name] 09/08/2022   Abnormal CT scan, sigmoid colon 04/14/2022   Colon stricture 04/14/2022   Diverticulosis of colon without hemorrhage 04/14/2022   Pulmonary embolism 11/15/2021   Tachycardia 10/22/2021   Pulmonary HTN    Unspecified protein-calorie malnutrition 04/05/2021   Pressure injury of skin 03/31/2021   UTI (urinary tract infection) 03/31/2021   Sepsis due to urinary tract infection 03/30/2021   Acute encephalopathy 03/30/2021   Thrombocytopenia 03/30/2021   Diabetic neuropathy 05/24/2019   Hypokalemia 04/21/2019   Anticoagulated 06/25/2018   Chronic pain 06/25/2018   CRI (chronic renal insufficiency), stage 3 (moderate) 06/25/2018   Drug-induced constipation 06/18/2018   DVT (deep venous thrombosis) 06/03/2018   Primary osteoarthritis of  both knees 05/26/2018   Body mass index (BMI) of 50-59.9 in adult 05/26/2018   Acquired hypothyroidism 05/26/2018   Chronic bilateral low back pain without sciatica 02/17/2018   Astigmatism with presbyopia, bilateral 03/26/2017   Cortical age-related cataract of both eyes 03/26/2017   Family history of glaucoma 03/26/2017   Long term current use of oral hypoglycemic drug 03/26/2017   Nuclear sclerotic cataract of both eyes 03/26/2017   Pain in left hip 02/18/2017   Closed nondisplaced intertrochanteric fracture of left femur with delayed healing 01/14/2017   Gout 11/29/2016   Depression 11/29/2016   Closed left hip fracture 11/29/2016   GERD (gastroesophageal reflux disease) 11/29/2016   Closed intertrochanteric fracture of hip, left, initial encounter 11/29/2016   Physical deconditioning 07/31/2015   Pulmonary fibrosis, postinflammatory 07/31/2015   Bronchiectasis without complication 07/31/2015   Tendinitis of left rotator cuff 06/13/2015   Chronic diastolic CHF (congestive heart failure) 04/21/2015   Acute kidney injury superimposed on chronic kidney disease 04/17/2015   Chronic respiratory failure with hypoxia 02/09/2015   Dyspnea and respiratory abnormality 01/03/2015   Normal coronary arteries 11/02/2014   Morbid obesity 10/30/2014   Dysphagia 08/21/2014   Chronic fatigue 06/21/2014   DOE (dyspnea on exertion) 05/24/2014   Primary osteoarthritis of left knee 04/26/2014   High risk medication use 04/17/2014   Aphasia 02/12/2014   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 01/18/2014   Lumbar facet arthropathy 01/18/2014   Bilateral edema of lower extremity 05/30/2013   Chronic cough 05/24/2013   ILD (interstitial lung disease) 04/10/2013   Spinal stenosis of lumbar region with radiculopathy 07/12/2012   Inability to walk 07/12/2012   Meningioma of left sphenoid wing involving cavernous sinus 02/17/2012   Chest pain of uncertain etiology 01/28/2012   Rheumatoid  arthritis 01/28/2012   Primary osteoarthritis of right knee 01/28/2012   Dyslipidemia    Thyroid disease    Essential hypertension     Past Surgical History:  Procedure Laterality Date   BRAIN SURGERY     Gamma knife 10/13. Needs repeat spring  '14   CARDIAC CATHETERIZATION N/A 10/31/2014   Procedure:  Right/Left Heart Cath and Coronary Angiography;  Surgeon: Corky Crafts, MD;  Location: [facility] INVASIVE CV LAB;  Service: Cardiovascular;  Laterality: N/A;   COLONOSCOPY WITH PROPOFOL N/A 04/14/2022   Procedure: COLONOSCOPY WITH PROPOFOL;  Surgeon: Beverley Fiedler, MD;  Location: [facility] ENDOSCOPY;  Service: Gastroenterology;  Laterality: N/A;   CYST EXCISION     2 on face   CYST REMOVAL NECK     ESOPHAGOGASTRODUODENOSCOPY (EGD) WITH PROPOFOL N/A 09/14/2014   Procedure: ESOPHAGOGASTRODUODENOSCOPY (EGD) WITH PROPOFOL;  Surgeon: Louis Meckel, MD;  Location: [facility] ENDOSCOPY;  Service: Endoscopy;  Laterality: N/A;   INCISION AND DRAINAGE HIP Left 01/16/2017   Procedure: IRRIGATION AND DEBRIDEMENT LEFT HIP;  Surgeon: Kathryne Hitch, MD;  Location: [facility] ORS;  Service: Orthopedics;  Laterality: Left;   INTRAMEDULLARY (IM) NAIL INTERTROCHANTERIC Left 11/29/2016   Procedure: INTRAMEDULLARY (IM) NAIL INTERTROCHANTRIC;  Surgeon: Kathryne Hitch, MD;  Location: [facility] OR;  Service: Orthopedics;  Laterality: Left;   OVARY SURGERY     RIGHT HEART CATH N/A 08/12/2021   Procedure: RIGHT HEART CATH;  Surgeon: Runell Gess, MD;  Location: [facility] INVASIVE CV LAB;  Service: Cardiovascular;  Laterality: N/A;   SHOULDER SURGERY Left    TONSILLECTOMY  age 45   VAGINAL HYSTERECTOMY     VIDEO BRONCHOSCOPY Bilateral 05/31/2013   Procedure: VIDEO BRONCHOSCOPY WITHOUT FLUORO;  Surgeon: Kalman Shan, MD;  Location: [facility] ENDOSCOPY;  Service: Cardiopulmonary;  Laterality: Bilateral;   video bronscoscopy  05/19/1998   lung    Current Outpatient Medications  Medication Sig Dispense Refill   acetaminophen  (TYLENOL) 500 MG tablet Take 500-1,000 mg by mouth every 6 (six) hours as needed for moderate pain.     albuterol (PROVENTIL) (2.5 MG/3ML) 0.083% nebulizer solution Take 3 mLs (2.5 mg total) by nebulization every 6 (six) hours as needed for wheezing. 75 mL 0   albuterol (VENTOLIN HFA) 108 (90 Base) MCG/ACT inhaler Inhale 2 puffs into the lungs every 4 (four) hours as needed for wheezing or shortness of breath.     allopurinol (ZYLOPRIM) 300 MG tablet Take 1 tablet (300 mg total) by mouth daily. 30 tablet 0   amLODipine (NORVASC) 10 MG tablet Take 10 mg by mouth daily.     apixaban (ELIQUIS) 5 MG TABS tablet Take 1 tablet (5 mg total) by mouth 2 (two) times daily. 180 tablet 1   Artificial Tear Ointment (DRY EYES OP) Place 1 drop into both eyes 2 (two) times daily as needed (dry eyes).     atorvastatin (LIPITOR) 10 MG tablet Take 1 tablet (10 mg total) by mouth daily. 90 tablet 3   azithromycin (ZITHROMAX) 250 MG tablet Take 250 mg by mouth 3 (three) times a week.     Calcium Carb-Cholecalciferol (CALCIUM + D3 PO) Take 2 tablets by mouth daily with lunch.      Cholecalciferol (VITAMIN D-3) 25 MCG (1000 UT) CAPS Take 2 capsules (2,000 Units total) by mouth daily. GIVE ONE TAB PO Q DAY 60 capsule 0   colchicine 0.6 MG tablet Take 1 tablet (0.6 mg total) by mouth daily as needed (gout flares). 30 tablet 0   cyclobenzaprine (FLEXERIL) 5 MG tablet Take 1 tablet (5 mg total) by mouth at bedtime as needed for muscle spasms. 90 tablet 2   diclofenac Sodium (VOLTAREN) 1 % GEL Apply 2 g topically 3 (three) times daily. Apply to both hands and knees (Patient taking differently: Apply 2 g topically 3 (three) times daily as needed (pain). Apply  to both hands and knees) 100 g 1   donepezil (ARICEPT) 10 MG tablet Take 1 tablet (10 mg total) by mouth at bedtime. 90 tablet 4   DULoxetine (CYMBALTA) 60 MG capsule Take 1 capsule (60 mg total) by mouth daily. 90 capsule 2   etanercept (ENBREL SURECLICK) 50 MG/ML  injection Inject 50 mg into the skin once a week.     fluticasone (FLONASE) 50 MCG/ACT nasal spray Place 2 sprays into both nostrils daily. 16 g 3   furosemide (LASIX) 80 MG tablet TAKE 1 TABLET(80 MG) BY MOUTH DAILY 30 tablet 11   gabapentin (NEURONTIN) 600 MG tablet Take 2 tablets (1,200 mg total) by mouth 3 (three) times daily. 180 tablet 2   guaiFENesin (MUCINEX) 600 MG 12 hr tablet Take 2 tablets (1,200 mg total) by mouth 2 (two) times daily. 120 tablet 0   hydrALAZINE (APRESOLINE) 25 MG tablet TAKE 1 TABLET(25 MG) BY MOUTH TWICE DAILY 180 tablet 1   hydroxychloroquine (PLAQUENIL) 200 MG tablet Take 1 tablet (200 mg total) by mouth daily. 30 tablet 0   leflunomide (ARAVA) 20 MG tablet Take 1 tablet (20 mg total) by mouth daily. 30 tablet 0   levothyroxine (SYNTHROID) 50 MCG tablet Take 1 tablet (50 mcg total) by mouth daily. 90 tablet 3   linaclotide (LINZESS) 72 MCG capsule Take 1 capsule (72 mcg total) by mouth daily before breakfast. 90 capsule 3   metolazone (ZAROXOLYN) 2.5 MG tablet TAKE 1 TABLET BY MOUTH DAILY AS NEEDED FOR 2 DAYS FOR WEIGHT ABOVE 255 POUNDS 30 tablet 0   metoprolol succinate (TOPROL-XL) 100 MG 24 hr tablet TAKE 1 TABLET BY MOUTH DAILY ALONG WITH 25 MG TABLET 90 tablet 1   metoprolol succinate (TOPROL-XL) 25 MG 24 hr tablet Take 5 tablets (125 mg total) by mouth daily. (Patient taking differently: Take 25 mg by mouth daily.) 150 tablet 3   morphine (MS CONTIN) 15 MG 12 hr tablet Take 1 tablet (15 mg total) by mouth every 12 (twelve) hours. 60 tablet 0   morphine (MSIR) 15 MG tablet Take 1 tablet (15 mg total) by mouth 2 (two) times daily. 60 tablet 0   Multiple Vitamin (MULTIVITAMIN WITH MINERALS) TABS Take 1 tablet by mouth daily.      olmesartan (BENICAR) 40 MG tablet Take 1 tablet (40 mg total) by mouth daily. 90 tablet 3   ondansetron (ZOFRAN-ODT) 4 MG disintegrating tablet Take 1 tablet (4 mg total) by mouth every 8 (eight) hours as needed for nausea or vomiting. 30  tablet 1   OXYGEN Inhale 3 L into the lungs See admin instructions. 3 L at bedtime, and 3 L during the day as needed for exertion     pantoprazole (PROTONIX) 40 MG tablet Take 1 tablet (40 mg total) by mouth 2 (two) times daily. take 1 tablet by mouth once daily MINUTES BEFORE 1ST MEAL OF THE DAY 120 tablet 3   Potassium Chloride ER 20 MEQ TBCR Take 20 mEq by mouth daily. 90 tablet 1   predniSONE (DELTASONE) 10 MG tablet Take 1 tablet (10 mg total) by mouth daily with breakfast. 90 tablet 1   Semaglutide,0.25 or 0.5MG /DOS, (OZEMPIC, 0.25 OR 0.5 MG/DOSE,) 2 MG/1.5ML SOPN Inject 0.5 mg into the skin once a week. 4.5 mL 3   No current facility-administered medications for this visit.    Allergies as of 09/08/2022 - Review Complete 09/08/2022  Allergen Reaction Noted   Codeine Swelling and Other (See Comments) 09/11/2011  Infliximab Anaphylaxis 09/11/2011   Lisinopril Swelling 09/11/2011   Ofev [nintedanib] Diarrhea 04/01/2022    Vitals: BP 139/82   Pulse 86   Ht 5\' 4"  (1.626 m)   Wt 229 lb 9.6 oz (104.1 kg)   BMI 39.41 kg/m  Last Weight:  Wt Readings from Last 1 Encounters:  09/08/22 229 lb 9.6 oz (104.1 kg)   Last Height:   Ht Readings from Last 1 Encounters:  09/08/22 5\' 4"  (1.626 m)     Physical exam: Exam: Gen: NAD, conversant, well nourised, obese, well groomed                     CV: RRR, no MRG. No Carotid Bruits. No peripheral edema, warm, nontender Eyes: Conjunctivae clear without exudates or hemorrhage  Neuro: Detailed Neurologic Exam  Speech:    Speech is normal; fluent and spontaneous with normal comprehension.  Cognition:     09/08/2022   10:40 AM 03/06/2022    7:56 AM  MMSE - Mini Mental State Exam  Orientation to time 4 5  Orientation to Place 5 3  Registration 3 3  Attention/ Calculation 1 1  Recall 3 3  Language- name 2 objects 2 2  Language- repeat 1 0  Language- follow 3 step command 3 3  Language- read & follow direction 1 1  Write a  sentence 1 1  Copy design 0 0  Total score 24 22   Cranial Nerves:    The pupils are equal, round, and reactive to light. Attempted pupils too small to visualize fundi. Visual fields are full to finger confrontation. Extraocular movements are intact. Trigeminal sensation is intact and the muscles of mastication are normal. The face is symmetric. The palate elevates in the midline. Hearing intact. Voice is normal. Shoulder shrug is normal. The tongue has normal motion without fasciculations.   Coordination:    Normal  Gait:   Attempted cannot stand independently  Motor Observation:    No asymmetry, no atrophy, and no involuntary movements noted. Tone:    Normal muscle tone.    Posture:    Posture is normal in wheel chair    Strength:    Strength is V/V in the upper and lower limbs.      Sensation: intact to LT     Reflex Exam:  DTR's:    Deep tendon reflexes in the upper and lower extremities are symmetrical bilaterally.   Toes:    The toes are downgoing bilaterally.   Clonus:    Clonus is absent.    Assessment/Plan:  82 y.o. female here as requested by Jones Bales, NP for memory changes. 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  - Patient is here with daughter today.  Daughter was not at last appointment so we reviewed appointment, I reviewed her MRI with daughter showed her the  moderate white matter changes and the microhemorrhages which are more likely from hypertension, I do not think patient has Alzheimer's, in fact her MMSE today is better than it was last time we took it, she has no history of Alzheimer's, at this point we decided to monitor her every 6 months and if we do see that there is a decline then we can evaluate further but patient says she is doing absolutely fabulous.  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 MMSE 22/30.  Given her microhemorrhages, its recommended not to be on aspirin or Eliquis however given patient's prior history risk of clotting/DVT versus cerebral hemorrhage is debatable.  I did talk to patient and her son about this last visit.  Since she has these hemorrhages in her brain, being on Eliquis is of a risk factor for bleeding in the brain, however not being on Eliquis is a risk factor for having clots, DVTs that she has had in the past.  At this point its risk versus benefit and I will leave it up to the family.  Luckily Eliquis is much safer than our prior medications. She is not a good candidate fo Lecambi given the bleeding risk already for lecambi. At this point I recommend aricept and namenda, managing vascular risk factors, and following clinically. They also agree to formal neurocognitive testing.   increase Donepezil - memory medication, want me to check with GI - sent a mychart message to Dr.  Cristela Felt memory testing with [place]. Clayborn [place] - they never did it, I recommended it they decline at this point F/u 6 months Speech therapy for cognitive therapy - It is her struggling with short-term memory and association, associating what she reads and understands and how to execute, she used to be a nurse, she gets frustrated, but patient is still very social and independent but reports she has a "traffic jam" in the brain. Maybe some speech cognitive therapy can help. Not sure if she went  Meds ordered this encounter  Medications   donepezil (ARICEPT) 10 MG tablet    Sig: Take 1 tablet (10 mg total) by mouth at bedtime.    Dispense:  90 tablet    Refill:  4    Cc: [name], MD,  [name], MD  Naomie Dean, MD  [facility] [address] [place], [place] [address]  Phone [phone] Fax [phone]  I spent over 40 minutes of face-to-face and non-face-to-face time with patient on the  1. [name]     diagnosis.  This included previsit chart review, lab review, study review, order entry, electronic health record documentation, patient education on the different diagnostic and therapeutic options, counseling and coordination of care, risks and benefits of management, compliance, or risk factor reduction

## 2022-09-22 ENCOUNTER — Encounter: Payer: Medicare Other | Attending: Physical Medicine & Rehabilitation | Admitting: Registered Nurse

## 2022-09-22 ENCOUNTER — Encounter: Payer: Self-pay | Admitting: Registered Nurse

## 2022-09-22 VITALS — BP 127/79 | HR 93 | Ht 64.0 in

## 2022-09-22 DIAGNOSIS — Z79891 Long term (current) use of opiate analgesic: Secondary | ICD-10-CM | POA: Insufficient documentation

## 2022-09-22 DIAGNOSIS — G894 Chronic pain syndrome: Secondary | ICD-10-CM

## 2022-09-22 DIAGNOSIS — M170 Bilateral primary osteoarthritis of knee: Secondary | ICD-10-CM | POA: Diagnosis not present

## 2022-09-22 DIAGNOSIS — G8929 Other chronic pain: Secondary | ICD-10-CM | POA: Insufficient documentation

## 2022-09-22 DIAGNOSIS — M2550 Pain in unspecified joint: Secondary | ICD-10-CM | POA: Insufficient documentation

## 2022-09-22 DIAGNOSIS — M7061 Trochanteric bursitis, right hip: Secondary | ICD-10-CM | POA: Diagnosis not present

## 2022-09-22 DIAGNOSIS — M25511 Pain in right shoulder: Secondary | ICD-10-CM | POA: Diagnosis not present

## 2022-09-22 DIAGNOSIS — M5416 Radiculopathy, lumbar region: Secondary | ICD-10-CM

## 2022-09-22 DIAGNOSIS — Z5181 Encounter for therapeutic drug level monitoring: Secondary | ICD-10-CM | POA: Diagnosis not present

## 2022-09-22 MED ORDER — MORPHINE SULFATE ER 15 MG PO TBCR: 15.0000 mg | EXTENDED_RELEASE_TABLET | Freq: Two times a day (BID) | ORAL | 0 refills | Status: DC

## 2022-09-22 MED ORDER — MORPHINE SULFATE 15 MG PO TABS: 15.0000 mg | ORAL_TABLET | Freq: Two times a day (BID) | ORAL | 0 refills | Status: DC

## 2022-09-22 NOTE — Progress Notes (Signed)
Subjective:    Patient ID: [name], female    DOB: April 06, 1941, 82 y.o.   MRN: [id number]  HPI: [name] is a 82 y.o. female who returns for follow up appointment for chronic pain and medication refill. She states her pain is located in her right shoulder, lower back pain radiating into her right lower extremity, right hip pain and bilateral knee pain. She also reports generalized joint pain. She  rates her pain 5. Her current exercise regime is walking with walker in her home short distances.   [name] Morphine equivalent is 60.00 MME.   Last Oral Swab was Performed on 07/28/2022, it was consistent.     Pain Inventory Average Pain 7 Pain Right Now 5 My pain is constant, sharp, burning, tingling, and aching  In the last 24 hours, has pain interfered with the following? General activity 7 Relation with others 7 Enjoyment of life 7 What TIME of day is your pain at its worst? morning  Sleep (in general) Fair  Pain is worse with: walking, bending, and standing Pain improves with: rest, heat/ice, and medication Relief from Meds:  fair  Family History  Problem Relation Age of Onset   Diabetes Mother    Heart attack Mother    Hypertension Father    Lung cancer Father    Diabetes Sister    Breast cancer Sister    Breast cancer Sister    Diabetes Brother    Hypertension Brother    Heart disease Brother    Heart attack Brother    Kidney cancer Brother    Gout Brother    Kidney failure Brother        x 5   Esophageal cancer Brother    Stomach cancer Brother    Prostate cancer Brother    Uterine cancer Daughter        with mets   Fibroids Daughter    Hypertension Daughter    Hypertension Daughter    Hypertension Son    Heart murmur Son    Rheum arthritis Maternal Uncle    Alzheimer's disease Neg Hx    Dementia Neg Hx    Social History   Socioeconomic History   Marital status: Married    Spouse name: Not on file   Number of children: 6   Years  of education: college   Highest education level: Not on file  Occupational History   Occupation: retired Engineer, civil (consulting)  Tobacco Use   Smoking status: Never   Smokeless tobacco: Never  Vaping Use   Vaping Use: Never used  Substance and Sexual Activity   Alcohol use: No   Drug use: No   Sexual activity: Not Currently  Other Topics Concern   Not on file  Social History Narrative   Patient consumes 2-3 cups coffee per day.   Social Determinants of Health   Financial Resource Strain: Low Risk  (10/23/2021)   Overall Financial Resource Strain (CARDIA)    Difficulty of Paying Living Expenses: Not hard at all  Food Insecurity: No Food Insecurity (08/13/2022)   Hunger Vital Sign    Worried About Running Out of Food in the Last Year: Never true    Ran Out of Food in the Last Year: Never true  Transportation Needs: Unmet Transportation Needs (05/28/2022)   PRAPARE - Administrator, Civil Service (Medical): Yes    Lack of Transportation (Non-Medical): No  Physical Activity: Insufficiently Active (10/23/2021)   Exercise Vital Sign  Days of Exercise per Week: 2 days    Minutes of Exercise per Session: 40 min  Stress: No Stress Concern Present (10/18/2020)   [employer] of Occupational Health - Occupational Stress Questionnaire    Feeling of Stress : Not at all  Social Connections: Socially Integrated (10/23/2021)   Social Connection and Isolation Panel [NHANES]    Frequency of Communication with Friends and Family: More than three times a week    Frequency of Social Gatherings with Friends and Family: More than three times a week    Attends Religious Services: More than 4 times per year    Active Member of [employer] or Organizations: Yes    Attends Banker Meetings: More than 4 times per year    Marital Status: Married   Past Surgical History:  Procedure Laterality Date   BRAIN SURGERY     Gamma knife 10/13. Needs repeat spring  '14   CARDIAC CATHETERIZATION N/A  10/31/2014   Procedure: Right/Left Heart Cath and Coronary Angiography;  Surgeon: Corky Crafts, MD;  Location: [facility] INVASIVE CV LAB;  Service: Cardiovascular;  Laterality: N/A;   COLONOSCOPY WITH PROPOFOL N/A 04/14/2022   Procedure: COLONOSCOPY WITH PROPOFOL;  Surgeon: Beverley Fiedler, MD;  Location: [facility] ENDOSCOPY;  Service: Gastroenterology;  Laterality: N/A;   CYST EXCISION     2 on face   CYST REMOVAL NECK     ESOPHAGOGASTRODUODENOSCOPY (EGD) WITH PROPOFOL N/A 09/14/2014   Procedure: ESOPHAGOGASTRODUODENOSCOPY (EGD) WITH PROPOFOL;  Surgeon: Louis Meckel, MD;  Location: [facility] ENDOSCOPY;  Service: Endoscopy;  Laterality: N/A;   INCISION AND DRAINAGE HIP Left 01/16/2017   Procedure: IRRIGATION AND DEBRIDEMENT LEFT HIP;  Surgeon: Kathryne Hitch, MD;  Location: [facility] ORS;  Service: Orthopedics;  Laterality: Left;   INTRAMEDULLARY (IM) NAIL INTERTROCHANTERIC Left 11/29/2016   Procedure: INTRAMEDULLARY (IM) NAIL INTERTROCHANTRIC;  Surgeon: Kathryne Hitch, MD;  Location: [facility] OR;  Service: Orthopedics;  Laterality: Left;   OVARY SURGERY     RIGHT HEART CATH N/A 08/12/2021   Procedure: RIGHT HEART CATH;  Surgeon: Runell Gess, MD;  Location: [facility] INVASIVE CV LAB;  Service: Cardiovascular;  Laterality: N/A;   SHOULDER SURGERY Left    TONSILLECTOMY  age 54   VAGINAL HYSTERECTOMY     VIDEO BRONCHOSCOPY Bilateral 05/31/2013   Procedure: VIDEO BRONCHOSCOPY WITHOUT FLUORO;  Surgeon: Kalman Shan, MD;  Location: [facility] ENDOSCOPY;  Service: Cardiopulmonary;  Laterality: Bilateral;   video bronscoscopy  05/19/1998   lung   Past Surgical History:  Procedure Laterality Date   BRAIN SURGERY     Gamma knife 10/13. Needs repeat spring  '14   CARDIAC CATHETERIZATION N/A 10/31/2014   Procedure: Right/Left Heart Cath and Coronary Angiography;  Surgeon: Corky Crafts, MD;  Location: [facility] INVASIVE CV LAB;  Service: Cardiovascular;  Laterality: N/A;   COLONOSCOPY WITH PROPOFOL N/A  04/14/2022   Procedure: COLONOSCOPY WITH PROPOFOL;  Surgeon: Beverley Fiedler, MD;  Location: [facility] ENDOSCOPY;  Service: Gastroenterology;  Laterality: N/A;   CYST EXCISION     2 on face   CYST REMOVAL NECK     ESOPHAGOGASTRODUODENOSCOPY (EGD) WITH PROPOFOL N/A 09/14/2014   Procedure: ESOPHAGOGASTRODUODENOSCOPY (EGD) WITH PROPOFOL;  Surgeon: Louis Meckel, MD;  Location: [facility] ENDOSCOPY;  Service: Endoscopy;  Laterality: N/A;   INCISION AND DRAINAGE HIP Left 01/16/2017   Procedure: IRRIGATION AND DEBRIDEMENT LEFT HIP;  Surgeon: Kathryne Hitch, MD;  Location: [facility] ORS;  Service: Orthopedics;  Laterality: Left;   INTRAMEDULLARY (IM) NAIL  INTERTROCHANTERIC Left 11/29/2016   Procedure: INTRAMEDULLARY (IM) NAIL INTERTROCHANTRIC;  Surgeon: Kathryne Hitch, MD;  Location: [facility] OR;  Service: Orthopedics;  Laterality: Left;   OVARY SURGERY     RIGHT HEART CATH N/A 08/12/2021   Procedure: RIGHT HEART CATH;  Surgeon: Runell Gess, MD;  Location: [facility] INVASIVE CV LAB;  Service: Cardiovascular;  Laterality: N/A;   SHOULDER SURGERY Left    TONSILLECTOMY  age 34   VAGINAL HYSTERECTOMY     VIDEO BRONCHOSCOPY Bilateral 05/31/2013   Procedure: VIDEO BRONCHOSCOPY WITHOUT FLUORO;  Surgeon: Kalman Shan, MD;  Location: [facility] ENDOSCOPY;  Service: Cardiopulmonary;  Laterality: Bilateral;   video bronscoscopy  05/19/1998   lung   Past Medical History:  Diagnosis Date   Acute on chronic diastolic (congestive) heart failure (HCC)    Anemia    iron deficiency anemia - secondary to blood loss ( chronic)    Anxiety    Arthritis    endstage changes bilateral knees/bilateral ankles.    Asthma    Carotid artery occlusion    Chronic fatigue    Chronic kidney disease    Closed left hip fracture (HCC)    Clotting disorder (HCC)    pt denies this   Contusion of left knee    due to fall 1/14.   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     Morbid obesity (HCC)    Neuromuscular disorder (HCC)    diabetic neuropathy    Normal coronary arteries    cardiac catheterization performed  10/31/14   Pneumonia    RA (rheumatoid arthritis) (HCC)    has been off methotreaxte since 10/13.   Spinal stenosis of lumbar region    Thyroid disease    Unspecified lack of coordination    URI (upper respiratory infection)    BP 127/79   Pulse 93   Ht 5\' 4"  (1.626 m)   BMI 39.41 kg/m   Opioid Risk Score:   Fall Risk Score:  `1  Depression screen [facility] 2/9     09/22/2022   11:27 AM 09/03/2022   10:10 AM 07/28/2022    1:29 PM 06/02/2022   10:12 AM 02/10/2022   11:39 AM 12/20/2021    3:26 PM 12/09/2021    1:12 PM  Depression screen PHQ 2/9  Decreased Interest 0 0 0 0 1 2 2   Down, Depressed, Hopeless 0 1 0 0 1 2 2   PHQ - 2 Score 0 1 0 0 2 4 4   Altered sleeping  1    1 1   Tired, decreased energy  1   2 1 2   Change in appetite  0   0 1 1  Feeling bad or failure about yourself   1   0 2 2  Trouble concentrating  0   0 2 1  Moving slowly or fidgety/restless  1   0 2 2  Suicidal thoughts  0  0 0 1  PHQ-9 Score  5    13 14   Difficult doing work/chores     Very difficult Very difficult Extremely dIfficult    Review of Systems  Musculoskeletal:  Positive for back pain and gait problem.       Pain in the right shoulder, right arm pain, right hand pain, pian in both knees, right hip pain.   All other systems reviewed and are negative.     Objective:   Physical Exam Vitals and nursing note reviewed.  Constitutional:      Appearance: Normal  appearance.  Cardiovascular:     Rate and Rhythm: Normal rate and regular rhythm.     Pulses: Normal pulses.     Heart sounds: Normal heart sounds.  Pulmonary:     Effort: Pulmonary effort is normal.     Breath sounds: Normal breath sounds.  Musculoskeletal:     Cervical back: Normal range of motion and neck supple.     Right lower leg: Edema present.     Left lower leg: Edema present.     Comments: Normal Muscle Bulk and Muscle Testing Reveals:  Upper Extremities: Right: Decreased ROM 45 Degrees and Muscle Strength 5/5 Left Upper Extremity: Full ROM  and Muscle Strength 5/5 Lumbar Paraspinal Tenderness: L-3-L-5 Right Greater Trochanter Tenderness Lower Extremities: Full ROM and Muscle Strength 5/5 Arrived in wheelchair     Skin:    General: Skin is warm and dry.  Neurological:     General: No focal deficit present.     Mental Status: She is alert and oriented to person, place, and time.  Psychiatric:        Mood and Affect: Mood normal.        Behavior: Behavior normal.         Assessment & Plan:  1. Lumbar spinal stenosis with neurogenic claudication. Associated facet arthropathy/ Lumbar Radiculitis: Continue current medication regimen with Gabapentin and HEP as tolerated. 09/22/2022 Refilled: MS Contin 15 mg one tablet every 12 hours #60 and  MSIR 15 one tablet twice a day as needed  For break through pain ( 6 am and 1:00 pm)  #60.since adverse effect with insomnia with Nucynta. Nucynta discontinued due to insomnia.  We will continue the opioid monitoring program, this consists of regular clinic visits, examinations, urine drug screen, pill counts as well as use of [place] Controlled Substance Reporting system. A 12 month History has been reviewed on the [place] Controlled Substance Reporting System 09/22/2022 2. Chronic Bilateral Thoracic Back Pain: Continue HEP as Tolerated. Continue to Monitor. 09/22/2022 3 Depression: Continue current medication Regime:  Cymbalta . Continue to monitor.09/22/2022 4. Diabetes mellitus type 2 with polyneuropathy: Continue current medication regime: Gabapentin. 09/22/2022 5. Rheumatoid arthritis and osteoarthritis. Continue current medication Regime.  Voltaren Gel. Rheumatology Following. 09/22/2022 6. Interstitial lung disease: Pulmonology Following. 09/22/2022. 7. Bilateral Osteoarthritis Knee's:  Continue current medication regimen. Voltaren Gel. 09/22/2022. 8. Muscle Spasm: Continue current medication regime : Flexeril. Continue to monitor.09/22/2022 9. Right Greater Trochanteric Tenderness: Continue current treatment with Ice/Heat Therapy. 09/22/2022. 10. Polyarthralgia: Rheumatology Following: Continue to monitor.09/22/2022 11. Morbid Obesitity: Continue Healthy Diet Regime and HEP. Continue to monitor.  09/22/2022 12. Cervicalgia/Cervical Radiculitis: No complaints today. Continue to Alternate with Heat and Ice Therapy. Continue to monitor. 09/22/2022     F/U in 2 months

## 2022-09-23 ENCOUNTER — Encounter: Payer: Self-pay | Admitting: Nurse Practitioner

## 2022-09-23 ENCOUNTER — Ambulatory Visit: Payer: Medicare Other | Attending: Nurse Practitioner | Admitting: Nurse Practitioner

## 2022-09-23 VITALS — BP 140/88 | HR 88 | Ht 64.0 in | Wt 229.2 lb

## 2022-09-23 DIAGNOSIS — Z86711 Personal history of pulmonary embolism: Secondary | ICD-10-CM | POA: Diagnosis not present

## 2022-09-23 DIAGNOSIS — Z7985 Long-term (current) use of injectable non-insulin antidiabetic drugs: Secondary | ICD-10-CM | POA: Diagnosis not present

## 2022-09-23 DIAGNOSIS — I10 Essential (primary) hypertension: Secondary | ICD-10-CM | POA: Insufficient documentation

## 2022-09-23 DIAGNOSIS — I2510 Atherosclerotic heart disease of native coronary artery without angina pectoris: Secondary | ICD-10-CM | POA: Diagnosis not present

## 2022-09-23 DIAGNOSIS — E1140 Type 2 diabetes mellitus with diabetic neuropathy, unspecified: Secondary | ICD-10-CM | POA: Insufficient documentation

## 2022-09-23 DIAGNOSIS — I5032 Chronic diastolic (congestive) heart failure: Secondary | ICD-10-CM | POA: Insufficient documentation

## 2022-09-23 DIAGNOSIS — I272 Pulmonary hypertension, unspecified: Secondary | ICD-10-CM | POA: Insufficient documentation

## 2022-09-23 NOTE — Patient Instructions (Signed)
Medication Instructions:  Your physician recommends that you continue on your current medications as directed. Please refer to the Current Medication list given to you today.  *If you need a refill on your cardiac medications before your next appointment, please call your pharmacy*   Lab Work: Your physician recommends that you return for lab work at your convenience. Fasting Lipid panel & CMET  If you have labs (blood work) drawn today and your tests are completely normal, you will receive your results only by: MyChart Message (if you have MyChart) OR A paper copy in the mail If you have any lab test that is abnormal or we need to change your treatment, we will call you to review the results.   Testing/Procedures: NONE ordered at this time of appointment     Follow-Up: At [facility], you and your health needs are our priority.  As part of our continuing mission to provide you with exceptional heart care, we have created designated Provider Care Teams.  These Care Teams include your primary Cardiologist (physician) and Advanced Practice Providers (APPs -  Physician Assistants and Nurse Practitioners) who all work together to provide you with the care you need, when you need it.  We recommend signing up for the patient portal called "MyChart".  Sign up information is provided on this After Visit Summary.  MyChart is used to connect with patients for Virtual Visits (Telemedicine).  Patients are able to view lab/test results, encounter notes, upcoming appointments, etc.  Non-urgent messages can be sent to your provider as well.   To learn more about what you can do with MyChart, go to [email or web address].    Your next appointment:   6 month(s)  Provider:   Nanetta Batty, MD     Other Instructions

## 2022-09-23 NOTE — Progress Notes (Addendum)
Office Visit    Patient Name: [name] Date of Encounter: 09/23/2022  Primary Care Provider:  [name], MD Primary Cardiologist:  Nanetta Batty, MD  Chief Complaint    82 year old female with a history of mild nonobstructive CAD, chronic diastolic heart failure, chronic dyspnea on exertion, h/o PE on Eliquis, hypertension, hyperlipidemia, type 2 diabetes, CKD, and ILD who pressents for follow-up related to heart failure and hypertension.  Past Medical History    Past Medical History:  Diagnosis Date   Acute on chronic diastolic (congestive) heart failure (HCC)    Anemia    iron deficiency anemia - secondary to blood loss ( chronic)    Anxiety    Arthritis    endstage changes bilateral knees/bilateral ankles.    Asthma    Carotid artery occlusion    Chronic fatigue    Chronic kidney disease    Closed left hip fracture (HCC)    Clotting disorder (HCC)    pt denies this   Contusion of left knee    due to fall 1/14.   COPD (chronic obstructive pulmonary disease) (HCC)    pulmonary fibrosis   Depression, reactive    Diabetes mellitus    type II    Diastolic dysfunction    Difficulty in walking    Family history of heart disease    Generalized muscle weakness    Gout    High cholesterol    History of falling    Hypertension    Hypothyroidism    Interstitial lung disease (HCC)    Meningioma of left sphenoid wing involving cavernous sinus (HCC) 02/17/2012   Continue diplopia, left eye pain and left headaches.     Morbid obesity (HCC)    Neuromuscular disorder (HCC)    diabetic neuropathy    Normal coronary arteries    cardiac catheterization performed  10/31/14   Pneumonia    RA (rheumatoid arthritis) (HCC)    has been off methotreaxte since 10/13.   Spinal stenosis of lumbar region    Thyroid disease    Unspecified lack of coordination    URI (upper respiratory infection)    Past Surgical History:  Procedure Laterality Date   BRAIN SURGERY      Gamma knife 10/13. Needs repeat spring  '14   CARDIAC CATHETERIZATION N/A 10/31/2014   Procedure: Right/Left Heart Cath and Coronary Angiography;  Surgeon: Corky Crafts, MD;  Location: [facility] INVASIVE CV LAB;  Service: Cardiovascular;  Laterality: N/A;   COLONOSCOPY WITH PROPOFOL N/A 04/14/2022   Procedure: COLONOSCOPY WITH PROPOFOL;  Surgeon: Beverley Fiedler, MD;  Location: [facility] ENDOSCOPY;  Service: Gastroenterology;  Laterality: N/A;   CYST EXCISION     2 on face   CYST REMOVAL NECK     ESOPHAGOGASTRODUODENOSCOPY (EGD) WITH PROPOFOL N/A 09/14/2014   Procedure: ESOPHAGOGASTRODUODENOSCOPY (EGD) WITH PROPOFOL;  Surgeon: Louis Meckel, MD;  Location: [facility] ENDOSCOPY;  Service: Endoscopy;  Laterality: N/A;   INCISION AND DRAINAGE HIP Left 01/16/2017   Procedure: IRRIGATION AND DEBRIDEMENT LEFT HIP;  Surgeon: Kathryne Hitch, MD;  Location: [facility] ORS;  Service: Orthopedics;  Laterality: Left;   INTRAMEDULLARY (IM) NAIL INTERTROCHANTERIC Left 11/29/2016   Procedure: INTRAMEDULLARY (IM) NAIL INTERTROCHANTRIC;  Surgeon: Kathryne Hitch, MD;  Location: [facility] OR;  Service: Orthopedics;  Laterality: Left;   OVARY SURGERY     RIGHT HEART CATH N/A 08/12/2021   Procedure: RIGHT HEART CATH;  Surgeon: Runell Gess, MD;  Location: [facility] INVASIVE CV LAB;  Service: Cardiovascular;  Laterality: N/A;   SHOULDER SURGERY Left    TONSILLECTOMY  age 21   VAGINAL HYSTERECTOMY     VIDEO BRONCHOSCOPY Bilateral 05/31/2013   Procedure: VIDEO BRONCHOSCOPY WITHOUT FLUORO;  Surgeon: Kalman Shan, MD;  Location: [facility] ENDOSCOPY;  Service: Cardiopulmonary;  Laterality: Bilateral;   video bronscoscopy  05/19/1998   lung    Allergies  Allergies  Allergen Reactions   Codeine Other (See Comments) and Swelling    Facial swelling  Chest pain and swelling in legs  Other Reaction(s): Other (See Comments)  Facial swelling, Chest pain and swelling in legs, , Facial swelling, Chest pain and swelling in legs, ,  Facial swelling, Chest pain and swelling in legs   Infliximab Anaphylaxis    (REMICADE) "sent me into shock"  "sent me into shock", "sent me into shock", "sent me into shock"   Lisinopril Swelling and Rash    Face and neck swelling  Face and neck swelling, Face and neck swelling, Face and neck swelling   Ofev [Nintedanib] Diarrhea     Labs/Other Studies Reviewed    The following studies were reviewed today: R/LHC 2016:  Mild CAD without significant obstructive disease. Ectasia in all three major proximal vessels. Mild pulmonary hypertension. Aortic saturation 87%. Pulmonary artery saturation 60%. Cardiac output 7.6 L. Cardiac index 3.3.   Continue medical therapy for lung disease. Continue preventative therapy for her heart disease.   Echo 10/2021: IMPRESSIONS     1. Left ventricular ejection fraction, by estimation, is 55 to 60%. The  left ventricle has normal function. The left ventricle has no regional  wall motion abnormalities. There is mild left ventricular hypertrophy.  Left ventricular diastolic parameters  are consistent with Grade I diastolic dysfunction (impaired relaxation).   2. Right ventricular systolic function is mildly reduced. The right  ventricular size is normal. There is normal pulmonary artery systolic  pressure. The estimated right ventricular systolic pressure is 31.1 mmHg.   3. The mitral valve is normal in structure. Trivial mitral valve  regurgitation. No evidence of mitral stenosis.   4. The aortic valve was not well visualized. Aortic valve regurgitation  is not visualized. No aortic stenosis is present.   5. The inferior vena cava is normal in size with greater than 50%  respiratory variability, suggesting right atrial pressure of 3 mmHg.    Recent Labs: 09/25/2021: Pro B Natriuretic peptide (BNP) 10.0 10/29/2021: Magnesium 2.3 03/01/2022: ALT 21; Hemoglobin 12.0; Platelets 144 06/26/2022: BUN 22; Creatinine, Ser 0.91; Potassium 3.7; Sodium  145 09/03/2022: TSH 3.18  Recent Lipid Panel    Component Value Date/Time   CHOL 136 09/26/2020 1355   CHOL 150 05/26/2018 1259   TRIG 283.0 (H) 09/26/2020 1355   HDL 40.00 09/26/2020 1355   HDL 65 05/26/2018 1259   CHOLHDL 3 09/26/2020 1355   VLDL 56.6 (H) 09/26/2020 1355   LDLCALC 75 09/22/2019 1144   LDLCALC 63 05/26/2018 1259   LDLDIRECT 59.0 09/26/2020 1355    History of Present Illness    82 year old female with the above past medical history including mild nonobstructive CAD, chronic diastolic heart failure, chronic dyspnea on exertion, h/o PE on Eliquis, hypertension, hyperlipidemia, type 2 diabetes, RA, CKD, and ILD.  Cardiac catheterization in 2016 showed mild nonobstructive CAD, mild pulmonary hypertension.  She has a history of ILD.  She has a history of prior syncope thought to be related to overmedication dehydration.  RHC in 07/2018 revealed PASP of 43/19 mean of 30, right atrial  pressure 20/18 and wedge pressure of 22 mean.  Most recent echocardiogram in 10/2021 showed EF 55 to 60%, normal LV function, no RWMA, mild LVH, G1 DD, mildly reduced RV systolic function, no significant valvular abnormalities.  CT of the chest this in 10/2021 revealed multiple subsegmental pulmonary emboli in the right upper and middle lobes.  She was started on Eliquis. Venous Dopplers showed no evidence of DVT.  She was last seen virtually on 03/27/2022 and was stable from a cardiac standpoint.   She presents today for follow-up accompanied by her daughter.  Since her last visit she has been stable from a cardiac standpoint.  She has stable chronic dyspnea in the setting of interstitial lung disease, she denies symptoms concerning for angina.  Bilateral lower extremity edema, denies PND, orthopnea, weight gain.  She is asking for clarification as to whether or not she is to continue Eliquis indefinitely (denies bleeding).  Overall, she reports feeling well.  Home Medications    Current Outpatient  Medications  Medication Sig Dispense Refill   acetaminophen (TYLENOL) 500 MG tablet Take 500-1,000 mg by mouth every 6 (six) hours as needed for moderate pain.     albuterol (PROVENTIL) (2.5 MG/3ML) 0.083% nebulizer solution Take 3 mLs (2.5 mg total) by nebulization every 6 (six) hours as needed for wheezing. 75 mL 0   albuterol (VENTOLIN HFA) 108 (90 Base) MCG/ACT inhaler Inhale 2 puffs into the lungs every 4 (four) hours as needed for wheezing or shortness of breath.     allopurinol (ZYLOPRIM) 300 MG tablet Take 1 tablet (300 mg total) by mouth daily. 30 tablet 0   amLODipine (NORVASC) 10 MG tablet Take 10 mg by mouth daily.     apixaban (ELIQUIS) 5 MG TABS tablet Take 1 tablet (5 mg total) by mouth 2 (two) times daily. 180 tablet 1   Artificial Tear Ointment (DRY EYES OP) Place 1 drop into both eyes 2 (two) times daily as needed (dry eyes).     atorvastatin (LIPITOR) 10 MG tablet Take 1 tablet (10 mg total) by mouth daily. 90 tablet 3   azithromycin (ZITHROMAX) 250 MG tablet Take 250 mg by mouth 3 (three) times a week.     Calcium Carb-Cholecalciferol (CALCIUM + D3 PO) Take 2 tablets by mouth daily with lunch.      Cholecalciferol (VITAMIN D-3) 25 MCG (1000 UT) CAPS Take 2 capsules (2,000 Units total) by mouth daily. GIVE ONE TAB PO Q DAY 60 capsule 0   colchicine 0.6 MG tablet Take 1 tablet (0.6 mg total) by mouth daily as needed (gout flares). 30 tablet 0   cyclobenzaprine (FLEXERIL) 5 MG tablet Take 1 tablet (5 mg total) by mouth at bedtime as needed for muscle spasms. 90 tablet 2   diclofenac Sodium (VOLTAREN) 1 % GEL Apply 2 g topically 3 (three) times daily. Apply to both hands and knees (Patient taking differently: Apply 2 g topically 3 (three) times daily as needed (pain). Apply to both hands and knees) 100 g 1   donepezil (ARICEPT) 10 MG tablet Take 1 tablet (10 mg total) by mouth at bedtime. 90 tablet 4   DULoxetine (CYMBALTA) 60 MG capsule Take 1 capsule (60 mg total) by mouth daily.  90 capsule 2   etanercept (ENBREL SURECLICK) 50 MG/ML injection Inject 50 mg into the skin once a week.     fluticasone (FLONASE) 50 MCG/ACT nasal spray Place 2 sprays into both nostrils daily. 16 g 3   furosemide (LASIX) 80 MG  tablet TAKE 1 TABLET(80 MG) BY MOUTH DAILY 30 tablet 11   gabapentin (NEURONTIN) 600 MG tablet Take 2 tablets (1,200 mg total) by mouth 3 (three) times daily. 180 tablet 2   guaiFENesin (MUCINEX) 600 MG 12 hr tablet Take 2 tablets (1,200 mg total) by mouth 2 (two) times daily. 120 tablet 0   hydrALAZINE (APRESOLINE) 25 MG tablet TAKE 1 TABLET(25 MG) BY MOUTH TWICE DAILY 180 tablet 1   hydroxychloroquine (PLAQUENIL) 200 MG tablet Take 1 tablet (200 mg total) by mouth daily. 30 tablet 0   leflunomide (ARAVA) 20 MG tablet Take 1 tablet (20 mg total) by mouth daily. 30 tablet 0   levothyroxine (SYNTHROID) 50 MCG tablet Take 1 tablet (50 mcg total) by mouth daily. 90 tablet 3   linaclotide (LINZESS) 72 MCG capsule Take 1 capsule (72 mcg total) by mouth daily before breakfast. 90 capsule 3   metolazone (ZAROXOLYN) 2.5 MG tablet TAKE 1 TABLET BY MOUTH DAILY AS NEEDED FOR 2 DAYS FOR WEIGHT ABOVE 255 POUNDS 30 tablet 0   metoprolol succinate (TOPROL-XL) 100 MG 24 hr tablet TAKE 1 TABLET BY MOUTH DAILY ALONG WITH 25 MG TABLET 90 tablet 1   metoprolol succinate (TOPROL-XL) 25 MG 24 hr tablet Take 5 tablets (125 mg total) by mouth daily. (Patient taking differently: Take 25 mg by mouth daily.) 150 tablet 3   morphine (MS CONTIN) 15 MG 12 hr tablet Take 1 tablet (15 mg total) by mouth every 12 (twelve) hours. 60 tablet 0   morphine (MSIR) 15 MG tablet Take 1 tablet (15 mg total) by mouth 2 (two) times daily. 60 tablet 0   Multiple Vitamin (MULTIVITAMIN WITH MINERALS) TABS Take 1 tablet by mouth daily.      olmesartan (BENICAR) 40 MG tablet Take 1 tablet (40 mg total) by mouth daily. 90 tablet 3   ondansetron (ZOFRAN-ODT) 4 MG disintegrating tablet Take 1 tablet (4 mg total) by mouth  every 8 (eight) hours as needed for nausea or vomiting. 30 tablet 1   OXYGEN Inhale 3 L into the lungs See admin instructions. 3 L at bedtime, and 3 L during the day as needed for exertion     pantoprazole (PROTONIX) 40 MG tablet Take 1 tablet (40 mg total) by mouth 2 (two) times daily. take 1 tablet by mouth once daily MINUTES BEFORE 1ST MEAL OF THE DAY 120 tablet 3   Potassium Chloride ER 20 MEQ TBCR Take 20 mEq by mouth daily. 90 tablet 1   predniSONE (DELTASONE) 10 MG tablet Take 1 tablet (10 mg total) by mouth daily with breakfast. 90 tablet 1   Semaglutide,0.25 or 0.5MG /DOS, (OZEMPIC, 0.25 OR 0.5 MG/DOSE,) 2 MG/1.5ML SOPN Inject 0.5 mg into the skin once a week. 4.5 mL 3   No current facility-administered medications for this visit.     Review of Systems    She denies chest pain, palpitations, dyspnea, pnd, orthopnea, n, v, dizziness, syncope, edema, weight gain, or early satiety. All other systems reviewed and are otherwise negative except as noted above.   Physical Exam    VS:  BP (!) 140/88 (BP Location: Left Arm, Patient Position: Sitting, Cuff Size: Normal)   Pulse 88   Ht 5\' 4"  (1.626 m)   Wt 229 lb 3.2 oz (104 kg)   SpO2 92%   BMI 39.34 kg/m   GEN: Well nourished, well developed, in no acute distress. HEENT: normal. Neck: Supple, no JVD, carotid bruits, or masses. Cardiac: RRR, no murmurs, rubs,  or gallops. No clubbing, cyanosis, nonpitting bilateral lower extremity edema.  Radials/DP/PT 2+ and equal bilaterally.  Respiratory:  Respirations regular and unlabored, fine crackles to bases bilaterally. GI: Soft, nontender, nondistended, BS + x 4. MS: no deformity or atrophy. Skin: warm and dry, no rash. Neuro:  Strength and sensation are intact. Psych: Normal affect.  Accessory Clinical Findings    ECG personally reviewed by me today -NSR, 88 bpm, LAFB- no acute changes.   Lab Results  Component Value Date   WBC 6.8 03/01/2022   HGB 12.0 03/01/2022   HCT 39.1  03/01/2022   MCV 96.8 03/01/2022   PLT 144 (L) 03/01/2022   Lab Results  Component Value Date   CREATININE 0.91 06/26/2022   BUN 22 06/26/2022   NA 145 06/26/2022   K 3.7 06/26/2022   CL 100 06/26/2022   CO2 36 (H) 06/26/2022   Lab Results  Component Value Date   ALT 21 03/01/2022   AST 15 03/01/2022   ALKPHOS 81 03/01/2022   BILITOT 0.5 03/01/2022   Lab Results  Component Value Date   CHOL 136 09/26/2020   HDL 40.00 09/26/2020   LDLCALC 75 09/22/2019   LDLDIRECT 59.0 09/26/2020   TRIG 283.0 (H) 09/26/2020   CHOLHDL 3 09/26/2020    Lab Results  Component Value Date   HGBA1C 5.8 09/03/2022    Assessment & Plan    1. Mild nonobstructive CAD: Cardiac catheterization in 2016 showed mild nonobstructive CAD, mild pulmonary hypertension. Stable with no anginal symptoms. No indication for ischemic evaluation.  Continue amlodipine, hydralazine, metoprolol, olmesartan, Lasix, and Lipitor.  2. Chronic diastolic heart failure:  Most recent echocardiogram in 10/2021 showed EF 55 to 60%, normal LV function, no RWMA, mild LVH, G1 DD, mildly reduced RV systolic function, no significant valvular abnormalities.  She has stable chronic dyspnea in the setting of ILD, stable nonpitting bilateral lower extremity edema, generally euvolemic and well compensated on exam.  Continue current medications as above.  3. History of DVT/PE: H/o prior DVT. PE diagnosed in 10/2021.  On Eliquis.  I will clarify with Dr. Allyson Sabal whether or not he wishes for her to continue Eliquis.  Recent CBC, BMET stable.  ADDENDUM 09/29/2022: Per Dr. Allyson Sabal, given patient's history of DVT/PE, will continue Eliquis indefinitely.   4. Hypertension: BP well controlled. Continue current antihypertensive regimen.   5. Hyperlipidemia: No recent LDL on file.  Will update fasting lipid panel, CMET.  Continue Lipitor.  6. Type 2 diabetes: A1c was 5.8 in 08/2022.  Monitor managed per PCP.  7. ILD: Stable chronic dyspnea. Following  with pulmonology.   8. CKD stage II: Creatinine was stable at 0.810 in 07/2022.  Follows with nephrology.  9. Disposition: Follow-up in 6 months, sooner if needed.      Joylene Grapes, NP 09/23/2022, 11:16 AM

## 2022-09-25 ENCOUNTER — Encounter: Payer: Self-pay | Admitting: Family Medicine

## 2022-09-26 ENCOUNTER — Telehealth: Payer: Self-pay

## 2022-09-26 NOTE — Progress Notes (Unsigned)
Patient ID: [name], female   DOB: 05-Oct-1940, 82 y.o.   MRN: [id number]  Care Management & Coordination Services Pharmacy Team  Reason for Encounter: Diabetes  Contacted patient to discuss diabetes disease state. {US HC Outreach:28874}  Current antihyperglycemic regimen:  Ozempic 0.5 mg (sampled)   Patient verbally confirms she is taking the above medications as directed. {yes/no:20286}  What diet changes have been made to improve diabetes control?  What recent interventions/DTPs have been made to improve glycemic control:  ***  Have there been any recent hospitalizations or ED visits since last visit with PharmD? {yes/no:20286}  Patient {reports/denies:24182} hypoglycemic symptoms, including {Hypoglycemic Symptoms:3049003}  Patient {reports/denies:24182} hyperglycemic symptoms, including {symptoms; hyperglycemia:17903}  How often are you checking your blood sugar? {BG Testing frequency:23922}  What are your blood sugars ranging?  Fasting: *** Before meals: *** After meals: *** Bedtime: ***  During the week, how often does your blood glucose drop below 70? {LowBGfrequency:24142}  Are you checking your feet daily/regularly? {yes/no:20286}     Current antihypertensive regimen:  Amlodipine 10mg  1 qd Hydralazine 25mg  BID  Olmesartan 40mg  1 qd   Patient verbally confirms she is taking the above medications as directed. {yes/no:20286}  How often are you checking your Blood Pressure? {CHL HP BP Monitoring Frequency:[phone]}  she checks her blood pressure {timing:25218} {before/after:25217} taking her medication.  Current home BP readings: ***  DATE:             BP               PULSE   Wrist or arm cuff: Caffeine intake: Salt intake: OTC medications including pseudoephedrine or NSAIDs?  Any readings above 180/100? {yes/no:20286} If yes any symptoms of hypertensive emergency? {hypertensive emergency symptoms:25354}  What recent interventions/DTPs have  been made by any provider to improve Blood Pressure control since last CPP Visit: ***  Any recent hospitalizations or ED visits since last visit with CPP? {yes/no:20286}  What diet changes have been made to improve Blood Pressure Control?  ***  What exercise is being done to improve your Blood Pressure Control?  ***    Chart Updates:  Recent office visits:  09/03/22 [name] - Patient presented for Acquired hypothyroidism and other concerns. No medication changes.   Recent consult visits:  09/23/22 Joylene Grapes, NP (Cardiology) - Patient presented for Coronary Artery disease involving native coronary artery of native heart without angina pectoris and other concerns. No medication changes.   09/22/22 Jones Bales, NP - Patient presented for Chronic right shoulder pain and other concerns. No medication changes.   09/08/22 Anson Fret, MD (Neurology) - Patient presented for MCI with memory loss. Increased Donepezil.   Hospital visits:  Medication Reconciliation was completed by comparing discharge summary, patient's EMR and Pharmacy list, and upon discussion with patient.   Patient presented to [facility] on 03/01/22 due to Lower abdominal pain and other concerns. Patient was present for 6 hours.   New?Medications Started at [facility] Discharge:?? -started  Zofran   Medication Changes at Hospital Discharge: -Changed  none   Medications Discontinued at Hospital Discharge: -Stopped  none   Medications that remain the same after Hospital Discharge:??  -All other medications will remain the same.      Medications: Outpatient Encounter Medications as of 09/26/2022  Medication Sig   acetaminophen (TYLENOL) 500 MG tablet Take 500-1,000 mg by mouth every 6 (six) hours as needed for moderate pain.   albuterol (PROVENTIL) (2.5 MG/3ML) 0.083%  nebulizer solution Take 3 mLs (2.5 mg total) by nebulization every 6 (six) hours as needed for wheezing.    albuterol (VENTOLIN HFA) 108 (90 Base) MCG/ACT inhaler Inhale 2 puffs into the lungs every 4 (four) hours as needed for wheezing or shortness of breath.   allopurinol (ZYLOPRIM) 300 MG tablet Take 1 tablet (300 mg total) by mouth daily.   amLODipine (NORVASC) 10 MG tablet Take 10 mg by mouth daily.   apixaban (ELIQUIS) 5 MG TABS tablet Take 1 tablet (5 mg total) by mouth 2 (two) times daily.   Artificial Tear Ointment (DRY EYES OP) Place 1 drop into both eyes 2 (two) times daily as needed (dry eyes).   atorvastatin (LIPITOR) 10 MG tablet Take 1 tablet (10 mg total) by mouth daily.   azithromycin (ZITHROMAX) 250 MG tablet Take 250 mg by mouth 3 (three) times a week.   Calcium Carb-Cholecalciferol (CALCIUM + D3 PO) Take 2 tablets by mouth daily with lunch.    Cholecalciferol (VITAMIN D-3) 25 MCG (1000 UT) CAPS Take 2 capsules (2,000 Units total) by mouth daily. GIVE ONE TAB PO Q DAY   colchicine 0.6 MG tablet Take 1 tablet (0.6 mg total) by mouth daily as needed (gout flares).   cyclobenzaprine (FLEXERIL) 5 MG tablet Take 1 tablet (5 mg total) by mouth at bedtime as needed for muscle spasms.   diclofenac Sodium (VOLTAREN) 1 % GEL Apply 2 g topically 3 (three) times daily. Apply to both hands and knees (Patient taking differently: Apply 2 g topically 3 (three) times daily as needed (pain). Apply to both hands and knees)   donepezil (ARICEPT) 10 MG tablet Take 1 tablet (10 mg total) by mouth at bedtime.   DULoxetine (CYMBALTA) 60 MG capsule Take 1 capsule (60 mg total) by mouth daily.   etanercept (ENBREL SURECLICK) 50 MG/ML injection Inject 50 mg into the skin once a week.   fluticasone (FLONASE) 50 MCG/ACT nasal spray Place 2 sprays into both nostrils daily.   furosemide (LASIX) 80 MG tablet TAKE 1 TABLET(80 MG) BY MOUTH DAILY   gabapentin (NEURONTIN) 600 MG tablet Take 2 tablets (1,200 mg total) by mouth 3 (three) times daily.   guaiFENesin (MUCINEX) 600 MG 12 hr tablet Take 2 tablets (1,200 mg  total) by mouth 2 (two) times daily.   hydrALAZINE (APRESOLINE) 25 MG tablet TAKE 1 TABLET(25 MG) BY MOUTH TWICE DAILY   hydroxychloroquine (PLAQUENIL) 200 MG tablet Take 1 tablet (200 mg total) by mouth daily.   leflunomide (ARAVA) 20 MG tablet Take 1 tablet (20 mg total) by mouth daily.   levothyroxine (SYNTHROID) 50 MCG tablet Take 1 tablet (50 mcg total) by mouth daily.   linaclotide (LINZESS) 72 MCG capsule Take 1 capsule (72 mcg total) by mouth daily before breakfast.   metolazone (ZAROXOLYN) 2.5 MG tablet TAKE 1 TABLET BY MOUTH DAILY AS NEEDED FOR 2 DAYS FOR WEIGHT ABOVE 255 POUNDS   metoprolol succinate (TOPROL-XL) 100 MG 24 hr tablet TAKE 1 TABLET BY MOUTH DAILY ALONG WITH 25 MG TABLET   metoprolol succinate (TOPROL-XL) 25 MG 24 hr tablet Take 5 tablets (125 mg total) by mouth daily. (Patient taking differently: Take 25 mg by mouth daily.)   morphine (MS CONTIN) 15 MG 12 hr tablet Take 1 tablet (15 mg total) by mouth every 12 (twelve) hours.   morphine (MSIR) 15 MG tablet Take 1 tablet (15 mg total) by mouth 2 (two) times daily.   Multiple Vitamin (MULTIVITAMIN WITH MINERALS) TABS Take 1  tablet by mouth daily.    olmesartan (BENICAR) 40 MG tablet Take 1 tablet (40 mg total) by mouth daily.   ondansetron (ZOFRAN-ODT) 4 MG disintegrating tablet Take 1 tablet (4 mg total) by mouth every 8 (eight) hours as needed for nausea or vomiting.   OXYGEN Inhale 3 L into the lungs See admin instructions. 3 L at bedtime, and 3 L during the day as needed for exertion   pantoprazole (PROTONIX) 40 MG tablet Take 1 tablet (40 mg total) by mouth 2 (two) times daily. take 1 tablet by mouth once daily MINUTES BEFORE 1ST MEAL OF THE DAY   Potassium Chloride ER 20 MEQ TBCR Take 20 mEq by mouth daily.   predniSONE (DELTASONE) 10 MG tablet Take 1 tablet (10 mg total) by mouth daily with breakfast.   Semaglutide,0.25 or 0.5MG /DOS, (OZEMPIC, 0.25 OR 0.5 MG/DOSE,) 2 MG/1.5ML SOPN Inject 0.5 mg into the skin once a  week.   No facility-administered encounter medications on file as of 09/26/2022.    Recent Relevant Labs: Lab Results  Component Value Date/Time   HGBA1C 5.8 09/03/2022 10:49 AM   HGBA1C 6.5 (H) 04/02/2021 12:56 AM    Kidney Function Lab Results  Component Value Date/Time   CREATININE 0.91 06/26/2022 11:50 AM   CREATININE 0.72 03/01/2022 02:43 PM   CREATININE 1.57 (H) 09/17/2016 02:38 PM   CREATININE 1.25 (H) 04/14/2016 01:08 PM   CREATININE 1.7 (H) 02/17/2012 01:37 PM   GFR 59.55 (L) 06/26/2022 11:50 AM   GFRNONAA >60 03/01/2022 02:43 PM   GFRNONAA 32 (L) 09/17/2016 02:38 PM   GFRAA 49 (L) 11/10/2019 08:52 PM   GFRAA 37 (L) 09/17/2016 02:38 PM    Star Rating Drugs:  Atorvastatin 10 mg - Last filled 08/06/22 30 DS at [facility] Ozempic 0.5 mg Olmesartan 40 mg    Care Gaps: Zoster Vaccine - Overdue COVID Booster - Overdue AWV - 11/22/21 A1C - 09/03/22   Pamala Duffel CMA Clinical Pharmacist Assistant [phone]

## 2022-09-29 DIAGNOSIS — I5032 Chronic diastolic (congestive) heart failure: Secondary | ICD-10-CM | POA: Diagnosis not present

## 2022-09-29 DIAGNOSIS — I10 Essential (primary) hypertension: Secondary | ICD-10-CM | POA: Diagnosis not present

## 2022-09-29 DIAGNOSIS — I2510 Atherosclerotic heart disease of native coronary artery without angina pectoris: Secondary | ICD-10-CM | POA: Diagnosis not present

## 2022-09-29 DIAGNOSIS — I272 Pulmonary hypertension, unspecified: Secondary | ICD-10-CM | POA: Diagnosis not present

## 2022-09-29 DIAGNOSIS — E1140 Type 2 diabetes mellitus with diabetic neuropathy, unspecified: Secondary | ICD-10-CM | POA: Diagnosis not present

## 2022-09-29 DIAGNOSIS — Z86711 Personal history of pulmonary embolism: Secondary | ICD-10-CM | POA: Diagnosis not present

## 2022-09-30 LAB — COMPREHENSIVE METABOLIC PANEL
ALT: 17 IU/L (ref 0–32)
AST: 13 IU/L (ref 0–40)
Albumin/Globulin Ratio: 2.2 (ref 1.2–2.2)
Albumin: 3.7 g/dL — ABNORMAL LOW (ref 3.8–4.8)
Alkaline Phosphatase: 90 IU/L (ref 44–121)
BUN/Creatinine Ratio: 26 (ref 12–28)
BUN: 25 mg/dL (ref 8–27)
Bilirubin Total: 0.2 mg/dL (ref 0.0–1.2)
CO2: 30 mmol/L — ABNORMAL HIGH (ref 20–29)
Calcium: 8.8 mg/dL (ref 8.7–10.3)
Chloride: 102 mmol/L (ref 96–106)
Creatinine, Ser: 0.95 mg/dL (ref 0.57–1.00)
Globulin, Total: 1.7 g/dL (ref 1.5–4.5)
Glucose: 92 mg/dL (ref 70–99)
Potassium: 3.9 mmol/L (ref 3.5–5.2)
Sodium: 145 mmol/L — ABNORMAL HIGH (ref 134–144)
Total Protein: 5.4 g/dL — ABNORMAL LOW (ref 6.0–8.5)
eGFR: 61 mL/min/{1.73_m2} (ref >59)

## 2022-09-30 LAB — LIPID PANEL
Chol/HDL Ratio: 3.5 ratio (ref 0.0–4.4)
Cholesterol, Total: 146 mg/dL (ref 100–199)
HDL: 42 mg/dL (ref >39)
LDL Chol Calc (NIH): 76 mg/dL (ref 0–99)
Triglycerides: 162 mg/dL — ABNORMAL HIGH (ref 0–149)
VLDL Cholesterol Cal: 28 mg/dL (ref 5–40)

## 2022-10-14 ENCOUNTER — Encounter: Payer: Medicare Other | Admitting: *Deleted

## 2022-10-14 DIAGNOSIS — J849 Interstitial pulmonary disease, unspecified: Secondary | ICD-10-CM

## 2022-10-14 DIAGNOSIS — Z006 Encounter for examination for normal comparison and control in clinical research program: Secondary | ICD-10-CM

## 2022-10-14 NOTE — Research (Signed)
Title: Chronic Fibrosing Interstitial Lung Disease with Progressive Phenotype Prospective Outcomes (ILD-PRO) Registry    Protocol #: IPF-PRO-SUB, Clinical Trials # [id number], Sponsor: [employer]   Protocol Version Amendment 4 dated 12Sep2019  and confirmed current on  Consent Version for today's visit date of  Is [employer] Approved Version 22 Apr 2018 Revised 22 Apr 2018   Objectives:  Describe current approaches to diagnosis and treatment of chronic fibrosing ILDs with progressive phenotype  Describe the natural history of chronic fibrosing ILDs with progressive phenotype  Assess quality of life from self-administered participant reported questionnaires for each disease group  Describe participant interactions with the healthcare system, describe treatment practices across multiple institutions for each disease group  Collect biological samples linked to well characterized chronic fibrosing ILDs with progressive phenotype to identify disease biomarkers  Collect data and biological samples that will support future research studies.                                            Key Inclusion Criteria: Willing and able to provide informed consent  Age ? 30 years  Diagnosis of a non-IPF ILD of any duration, including, but not limited to Idiopathic Non-Specific Interstitial Pneumonia (INSIP), Unclassifiable Idiopathic Interstitial Pneumonias (IIPs), Interstitial Pneumonia with Autoimmune Features (IPAF), Autoimmune ILDs such as Rheumatoid Arthritis (RA-ILD) and Systemic Sclerosis (SSC-ILD), Chronic Hypersensitivity Pneumonitis (HP), Sarcoidosis or Exposure-related ILDs such as asbestosis.  Chronic fibrosing ILD defined by reticular abnormality with traction bronchiectasis with or without honeycombing confirmed by chest HRCT scan and/or lung biopsy.  Progressive phenotype as defined by fulfilling at least one of the criteria below of fibrotic changes (progression set point)  within the last 24 months regardless of treatment considered appropriate in individual ILDs:  decline in FVC % predicted (% pred) based on >10% relative decline  decline in FVC % pred based on ? 5 - <10% relative decline in FVC combined with worsening of respiratory symptoms as assessed by the site investigator  decline in FVC % pred based on ? 5 - <10% relative decline in FVC combined with increasing extent of fibrotic changes on chest imaging (HRCT scan) as assessed by the site investigator  decline in DLCO % pred based on ? 10% relative decline  worsening of respiratory symptoms as well as increasing extent of fibrotic changes on chest imaging (HRCT scan) as assessed by the site investigator independent of FVC change.     Key Exclusion Criteria: Malignancy, treated or untreated, other than skin or early stage prostate cancer, within the past 5 years  Currently listed for lung transplantation at the time of enrollment  Currently enrolled in a clinical trial at the time of enrollment in this registry       Clinical Research Coordinator / Research RN note : This visit for [name]  Subject [id number] with DOB:06/02/1940  on 14 Oct 2022 for the above protocol is Visit/Encounter 01 and is for purpose of research.    Subject expressed continued interest and consent in continuing as a study subject. Subject confirmed that there was no change in contact information (e.g. address, telephone, email). Subject thanked for participation in research and contribution to science.     During this visit on 28/May/2024  , the subject completed the blood work and questionnaires per the above referenced protocol. Please refer to the subject's paper source binder for  further details.   Signed by Neita Garnet Clinical Research Coordinator [employer]  [place], [place] 10:56 AM

## 2022-10-27 ENCOUNTER — Ambulatory Visit (INDEPENDENT_AMBULATORY_CARE_PROVIDER_SITE_OTHER): Payer: Medicare Other

## 2022-10-27 VITALS — Ht 64.0 in | Wt 229.0 lb

## 2022-10-27 DIAGNOSIS — J8410 Pulmonary fibrosis, unspecified: Secondary | ICD-10-CM | POA: Diagnosis not present

## 2022-10-27 DIAGNOSIS — R0602 Shortness of breath: Secondary | ICD-10-CM | POA: Diagnosis not present

## 2022-10-27 DIAGNOSIS — Z0000 Encounter for general adult medical examination without abnormal findings: Secondary | ICD-10-CM

## 2022-10-27 NOTE — Progress Notes (Signed)
Subjective:   [name] is a 82 y.o. female who presents for Medicare Annual (Subsequent) preventive examination.  Review of Systems    Virtual Visit via Telephone Note  I connected with  [name] on 10/27/22 at  1:30 PM EDT by telephone and verified that I am speaking with the correct person using two identifiers.  Location: Patient: Home Provider: Office Persons participating in the virtual visit: patient/Nurse Health Advisor   I discussed the limitations, risks, security and privacy concerns of performing an evaluation and management service by telephone and the availability of in person appointments. The patient expressed understanding and agreed to proceed.  Interactive audio and video telecommunications were attempted between this nurse and patient, however failed, due to patient having technical difficulties OR patient did not have access to video capability.  We continued and completed visit with audio only.  Some vital signs may be absent or patient reported.   Tillie Rung, LPN  Cardiac Risk Factors include: advanced age (>64men, >79 women);diabetes mellitus;hypertension     Objective:    Today's Vitals   10/27/22 1337  Weight: 229 lb (103.9 kg)  Height: 5\' 4"  (1.626 m)   Body mass index is 39.31 kg/m.     10/27/2022    1:52 PM 04/14/2022    9:18 AM 03/27/2022    1:27 PM 03/01/2022    2:41 PM 10/23/2021   12:02 PM 09/17/2021   11:13 AM 08/16/2021   11:01 AM  Advanced Directives  Does Patient Have a Medical Advance Directive? Yes Yes Yes Yes Yes Yes Yes  Type of Estate agent of [place];Living will Healthcare Power of [place];Living will  Living will Healthcare Power of [place];Living will Healthcare Power of [place];Living will Healthcare Power of [place];Living will  Does patient want to make changes to medical advance directive? No - Patient declined  No - Patient declined  No - Patient declined    Copy of  Healthcare Power of Attorney in Chart? Yes - validated most recent copy scanned in chart (See row information) No - copy requested   Yes - validated most recent copy scanned in chart (See row information)      Current Medications (verified) Outpatient Encounter Medications as of 10/27/2022  Medication Sig   acetaminophen (TYLENOL) 500 MG tablet Take 500-1,000 mg by mouth every 6 (six) hours as needed for moderate pain.   albuterol (PROVENTIL) (2.5 MG/3ML) 0.083% nebulizer solution Take 3 mLs (2.5 mg total) by nebulization every 6 (six) hours as needed for wheezing.   albuterol (VENTOLIN HFA) 108 (90 Base) MCG/ACT inhaler Inhale 2 puffs into the lungs every 4 (four) hours as needed for wheezing or shortness of breath.   allopurinol (ZYLOPRIM) 300 MG tablet Take 1 tablet (300 mg total) by mouth daily.   amLODipine (NORVASC) 10 MG tablet Take 10 mg by mouth daily.   apixaban (ELIQUIS) 5 MG TABS tablet Take 1 tablet (5 mg total) by mouth 2 (two) times daily.   Artificial Tear Ointment (DRY EYES OP) Place 1 drop into both eyes 2 (two) times daily as needed (dry eyes).   atorvastatin (LIPITOR) 10 MG tablet Take 1 tablet (10 mg total) by mouth daily.   azithromycin (ZITHROMAX) 250 MG tablet Take 250 mg by mouth 3 (three) times a week.   Calcium Carb-Cholecalciferol (CALCIUM + D3 PO) Take 2 tablets by mouth daily with lunch.    Cholecalciferol (VITAMIN D-3) 25 MCG (1000 UT) CAPS Take 2 capsules (2,000 Units  total) by mouth daily. GIVE ONE TAB PO Q DAY   colchicine 0.6 MG tablet Take 1 tablet (0.6 mg total) by mouth daily as needed (gout flares).   cyclobenzaprine (FLEXERIL) 5 MG tablet Take 1 tablet (5 mg total) by mouth at bedtime as needed for muscle spasms.   diclofenac Sodium (VOLTAREN) 1 % GEL Apply 2 g topically 3 (three) times daily. Apply to both hands and knees (Patient taking differently: Apply 2 g topically 3 (three) times daily as needed (pain). Apply to both hands and knees)   donepezil  (ARICEPT) 10 MG tablet Take 1 tablet (10 mg total) by mouth at bedtime.   DULoxetine (CYMBALTA) 60 MG capsule Take 1 capsule (60 mg total) by mouth daily.   etanercept (ENBREL SURECLICK) 50 MG/ML injection Inject 50 mg into the skin once a week.   fluticasone (FLONASE) 50 MCG/ACT nasal spray Place 2 sprays into both nostrils daily.   furosemide (LASIX) 80 MG tablet TAKE 1 TABLET(80 MG) BY MOUTH DAILY   gabapentin (NEURONTIN) 600 MG tablet Take 2 tablets (1,200 mg total) by mouth 3 (three) times daily.   guaiFENesin (MUCINEX) 600 MG 12 hr tablet Take 2 tablets (1,200 mg total) by mouth 2 (two) times daily.   hydrALAZINE (APRESOLINE) 25 MG tablet TAKE 1 TABLET(25 MG) BY MOUTH TWICE DAILY   hydroxychloroquine (PLAQUENIL) 200 MG tablet Take 1 tablet (200 mg total) by mouth daily.   leflunomide (ARAVA) 20 MG tablet Take 1 tablet (20 mg total) by mouth daily.   levothyroxine (SYNTHROID) 50 MCG tablet Take 1 tablet (50 mcg total) by mouth daily.   linaclotide (LINZESS) 72 MCG capsule Take 1 capsule (72 mcg total) by mouth daily before breakfast.   metolazone (ZAROXOLYN) 2.5 MG tablet TAKE 1 TABLET BY MOUTH DAILY AS NEEDED FOR 2 DAYS FOR WEIGHT ABOVE 255 POUNDS   metoprolol succinate (TOPROL-XL) 100 MG 24 hr tablet TAKE 1 TABLET BY MOUTH DAILY ALONG WITH 25 MG TABLET   metoprolol succinate (TOPROL-XL) 25 MG 24 hr tablet Take 5 tablets (125 mg total) by mouth daily. (Patient taking differently: Take 25 mg by mouth daily.)   morphine (MS CONTIN) 15 MG 12 hr tablet Take 1 tablet (15 mg total) by mouth every 12 (twelve) hours.   morphine (MSIR) 15 MG tablet Take 1 tablet (15 mg total) by mouth 2 (two) times daily.   Multiple Vitamin (MULTIVITAMIN WITH MINERALS) TABS Take 1 tablet by mouth daily.    olmesartan (BENICAR) 40 MG tablet Take 1 tablet (40 mg total) by mouth daily.   ondansetron (ZOFRAN-ODT) 4 MG disintegrating tablet Take 1 tablet (4 mg total) by mouth every 8 (eight) hours as needed for nausea or  vomiting.   OXYGEN Inhale 3 L into the lungs See admin instructions. 3 L at bedtime, and 3 L during the day as needed for exertion   pantoprazole (PROTONIX) 40 MG tablet Take 1 tablet (40 mg total) by mouth 2 (two) times daily. take 1 tablet by mouth once daily MINUTES BEFORE 1ST MEAL OF THE DAY   Potassium Chloride ER 20 MEQ TBCR Take 20 mEq by mouth daily.   predniSONE (DELTASONE) 10 MG tablet Take 1 tablet (10 mg total) by mouth daily with breakfast.   Semaglutide,0.25 or 0.5MG /DOS, (OZEMPIC, 0.25 OR 0.5 MG/DOSE,) 2 MG/1.5ML SOPN Inject 0.5 mg into the skin once a week.   No facility-administered encounter medications on file as of 10/27/2022.    Allergies (verified) Codeine, Infliximab, Lisinopril, and Ofev [nintedanib]  History: Past Medical History:  Diagnosis Date   Acute on chronic diastolic (congestive) heart failure (HCC)    Anemia    iron deficiency anemia - secondary to blood loss ( chronic)    Anxiety    Arthritis    endstage changes bilateral knees/bilateral ankles.    Asthma    Carotid artery occlusion    Chronic fatigue    Chronic kidney disease    Closed left hip fracture (HCC)    Clotting disorder (HCC)    pt denies this   Contusion of left knee    due to fall 1/14.   COPD (chronic obstructive pulmonary disease) (HCC)    pulmonary fibrosis   Depression, reactive    Diabetes mellitus    type II    Diastolic dysfunction    Difficulty in walking    Family history of heart disease    Generalized muscle weakness    Gout    High cholesterol    History of falling    Hypertension    Hypothyroidism    Interstitial lung disease (HCC)    Meningioma of left sphenoid wing involving cavernous sinus (HCC) 02/17/2012   Continue diplopia, left eye pain and left headaches.     Morbid obesity (HCC)    Neuromuscular disorder (HCC)    diabetic neuropathy    Normal coronary arteries    cardiac catheterization performed  10/31/14   Pneumonia    RA (rheumatoid  arthritis) (HCC)    has been off methotreaxte since 10/13.   Spinal stenosis of lumbar region    Thyroid disease    Unspecified lack of coordination    URI (upper respiratory infection)    Past Surgical History:  Procedure Laterality Date   BRAIN SURGERY     Gamma knife 10/13. Needs repeat spring  '14   CARDIAC CATHETERIZATION N/A 10/31/2014   Procedure: Right/Left Heart Cath and Coronary Angiography;  Surgeon: Corky Crafts, MD;  Location: [facility] INVASIVE CV LAB;  Service: Cardiovascular;  Laterality: N/A;   COLONOSCOPY WITH PROPOFOL N/A 04/14/2022   Procedure: COLONOSCOPY WITH PROPOFOL;  Surgeon: Beverley Fiedler, MD;  Location: [facility] ENDOSCOPY;  Service: Gastroenterology;  Laterality: N/A;   CYST EXCISION     2 on face   CYST REMOVAL NECK     ESOPHAGOGASTRODUODENOSCOPY (EGD) WITH PROPOFOL N/A 09/14/2014   Procedure: ESOPHAGOGASTRODUODENOSCOPY (EGD) WITH PROPOFOL;  Surgeon: Louis Meckel, MD;  Location: [facility] ENDOSCOPY;  Service: Endoscopy;  Laterality: N/A;   INCISION AND DRAINAGE HIP Left 01/16/2017   Procedure: IRRIGATION AND DEBRIDEMENT LEFT HIP;  Surgeon: Kathryne Hitch, MD;  Location: [facility] ORS;  Service: Orthopedics;  Laterality: Left;   INTRAMEDULLARY (IM) NAIL INTERTROCHANTERIC Left 11/29/2016   Procedure: INTRAMEDULLARY (IM) NAIL INTERTROCHANTRIC;  Surgeon: Kathryne Hitch, MD;  Location: [facility] OR;  Service: Orthopedics;  Laterality: Left;   OVARY SURGERY     RIGHT HEART CATH N/A 08/12/2021   Procedure: RIGHT HEART CATH;  Surgeon: Runell Gess, MD;  Location: [facility] INV[facility] CV LAB;  Service: Cardiovascular;  Laterality: N/A;   SHOULDER SURGERY Left    TONSILLECTOMY  age 68   VAGINAL HYSTERECTOMY     VIDEO BRONCHOSCOPY Bilateral 05/31/2013   Procedure: VIDEO BRONCHOSCOPY WITHOUT FLUORO;  Surgeon: Kalman Shan, MD;  Location: [facility] ENDOSCOPY;  Service: Cardiopulmonary;  Laterality: Bilateral;   video bronscoscopy  05/19/1998   lung   Family History  Problem  Relation Age of Onset   Diabetes Mother    Heart attack Mother  Hypertension Father    Lung cancer Father    Diabetes Sister    Breast cancer Sister    Breast cancer Sister    Diabetes Brother    Hypertension Brother    Heart disease Brother    Heart attack Brother    Kidney cancer Brother    Gout Brother    Kidney failure Brother        x 5   Esophageal cancer Brother    Stomach cancer Brother    Prostate cancer Brother    Uterine cancer Daughter        with mets   Fibroids Daughter    Hypertension Daughter    Hypertension Daughter    Hypertension Son    Heart murmur Son    Rheum arthritis Maternal Uncle    Alzheimer's disease Neg Hx    Dementia Neg Hx    Social History   Socioeconomic History   Marital status: Married    Spouse name: Not on file   Number of children: 6   Years of education: college   Highest education level: Not on file  Occupational History   Occupation: retired Engineer, civil (consulting)  Tobacco Use   Smoking status: Never   Smokeless tobacco: Never  Vaping Use   Vaping Use: Never used  Substance and Sexual Activity   Alcohol use: No   Drug use: No   Sexual activity: Not Currently  Other Topics Concern   Not on file  Social History Narrative   Patient consumes 2-3 cups coffee per day.   Social Determinants of Health   Financial Resource Strain: Low Risk  (10/27/2022)   Overall Financial Resource Strain (CARDIA)    Difficulty of Paying Living Expenses: Not hard at all  Food Insecurity: No Food Insecurity (10/27/2022)   Hunger Vital Sign    Worried About Running Out of Food in the Last Year: Never true    Ran Out of Food in the Last Year: Never true  Transportation Needs: Unmet Transportation Needs (05/28/2022)   PRAPARE - Administrator, Civil Service (Medical): Yes    Lack of Transportation (Non-Medical): No  Physical Activity: Insufficiently Active (10/27/2022)   Exercise Vital Sign    Days of Exercise per Week: 7 days    Minutes of  Exercise per Session: 20 min  Stress: No Stress Concern Present (10/27/2022)   [employer] of Occupational Health - Occupational Stress Questionnaire    Feeling of Stress : Not at all  Social Connections: Socially Integrated (10/27/2022)   Social Connection and Isolation Panel [NHANES]    Frequency of Communication with Friends and Family: More than three times a week    Frequency of Social Gatherings with Friends and Family: More than three times a week    Attends Religious Services: More than 4 times per year    Active Member of [employer] or Organizations: Yes    Attends Engineer, structural: More than 4 times per year    Marital Status: Married    Tobacco Counseling Counseling given: Not Answered   Clinical Intake:  Pre-visit preparation completed: No  Pain : No/denies pain Nutrition Risk Assessment:  Has the patient had any N/V/D within the last 2 months?  No  Does the patient have any non-healing wounds?  No  Has the patient had any unintentional weight loss or weight gain?  No   Diabetes:  Is the patient diabetic?  Yes  If diabetic, was a CBG obtained today?  Yes  CBG 110 Taken by patient Did the patient bring in their glucometer from home?  No  How often do you monitor your CBG's? 2-3 X weekly.   Financial Strains and Diabetes Management:  Are you having any financial strains with the device, your supplies or your medication? No .  Does the patient want to be seen by Chronic Care Management for management of their diabetes?  No  Would the patient like to be referred to a Nutritionist or for Diabetic Management?  No   Diabetic Exams:  Diabetic Eye Exam: Completed . Overdue for diabetic eye exam. Pt has been advised about the importance in completing this exam. A referral has been placed today. Message sent to referral coordinator for scheduling purposes. Advised pt to expect a call from office referred to regarding appt.  Diabetic Foot Exam: Completed  .  Pt has been advised about the importance in completing this exam. Pt is scheduled for diabetic foot exam on Followed by PCP.      BMI - recorded: 39.31 Nutritional Status: BMI > 30  Obese Nutritional Risks: None Diabetes: Yes CBG done?: Yes (CBG 145 Taken by patient) CBG resulted in Enter/ Edit results?: Yes Did pt. bring in CBG monitor from home?: No  How often do you need to have someone help you when you read instructions, pamphlets, or other written materials from your doctor or pharmacy?: 1 - Never  Diabetic? Yes  Interpreter Needed?: No  Information entered by :: Theresa Mulligan LPN   Activities of Daily Living    10/27/2022    1:47 PM  In your present state of health, do you have any difficulty performing the following activities:  Hearing? 0  Vision? 0  Difficulty concentrating or making decisions? 0  Walking or climbing stairs? 1  Comment Uses walker, cane and wheelchair  Dressing or bathing? 0  Doing errands, shopping? 0  Preparing Food and eating ? N  Using the Toilet? N  In the past six months, have you accidently leaked urine? Y  Comment Rx medication .Wears breifs and pads. Followed by medical attention  Do you have problems with loss of bowel control? N  Managing your Medications? N  Managing your Finances? N  Housekeeping or managing your Housekeeping? N    Patient Care Team: [name], MD as PCP - General (Family Medicine) Runell Gess, MD as PCP - Cardiology (Cardiology) Duffy, Vella Kohler, LCSW as Social Worker (Licensed Clinical Social Worker) [facility] Pollyann Savoy, MD as Consulting Physician (Rheumatology) Darcella Cheshire, NP (Rheumatology) Adline Potter, MD as Referring Physician (Pulmonary Disease) Sherrill Raring, [facility] (Pharmacist)  Indicate any recent Medical Services you may have received from other than [facility] providers in the past year (date may be approximate).     Assessment:   This is a  routine wellness examination for Merry.  Hearing/Vision screen Hearing Screening - Comments:: Denies hearing difficulties   Vision Screening - Comments:: Wears rx glasses - up to date with routine eye exams with  Maggie Schwalbe  Dietary issues and exercise activities discussed: Exercise limited by: None identified   Goals Addressed               This Visit's Progress     Patient stated (pt-stated)        Move more with less pain.       Depression Screen    10/27/2022    1:46 PM 09/22/2022   11:27 AM 09/03/2022   10:10  AM 07/28/2022    1:29 PM 06/02/2022   10:12 AM 02/10/2022   11:39 AM 12/20/2021    3:26 PM  PHQ 2/9 Scores  PHQ - 2 Score 0 0 1 0 0 2 4  PHQ- 9 Score 0  5    13    Fall Risk    10/27/2022    1:49 PM 09/22/2022   11:27 AM 09/03/2022   10:09 AM 07/28/2022    1:29 PM 06/02/2022   10:11 AM  Fall Risk   Falls in the past year? 1 0 0 1 0  Number falls in past yr: 0 0 0 1   Comment    no new fall   Injury with Fall? 1 0 0    Comment Fx finger on right hand. Followed by medical attention      Risk for fall due to : Impaired balance/gait  No Fall Risks    Follow up Falls prevention discussed  Falls evaluation completed      FALL RISK PREVENTION PERTAINING TO THE HOME:  Any stairs in or around the home? Yes  If so, are there any without handrails? No  Home free of loose throw rugs in walkways, pet beds, electrical cords, etc? Yes  Adequate lighting in your home to reduce risk of falls? Yes   ASSISTIVE DEVICES UTILIZED TO PREVENT FALLS:  Life alert? No  Use of a cane, walker or w/c? Yes  Grab bars in the bathroom? Yes  Shower chair or bench in shower? Yes  Elevated toilet seat or a handicapped toilet? Yes   TIMED UP AND GO:  Was the test performed? No . Audio Visit    Cognitive Function:    09/08/2022   10:40 AM 03/06/2022    7:56 AM  MMSE - Mini Mental State Exam  Orientation to time 4 5  Orientation to Place 5 3  Registration 3 3   Attention/ Calculation 1 1  Recall 3 3  Language- name 2 objects 2 2  Language- repeat 1 0  Language- follow 3 step command 3 3  Language- read & follow direction 1 1  Write a sentence 1 1  Copy design 0 0  Total score 24 22        10/27/2022    1:52 PM 10/23/2021   12:02 PM  6CIT Screen  What Year? 0 points 0 points  What month? 0 points 0 points  What time? 0 points 0 points  Count back from 20 0 points 0 points  Months in reverse 0 points 0 points  Repeat phrase 0 points 0 points  Total Score 0 points 0 points    Immunizations Immunization History  Administered Date(s) Administered   Fluad Quad(high Dose 65+) 02/02/2019, 03/11/2021, 04/01/2022   Influenza Split 01/29/2012, 03/30/2013, 02/10/2014   Influenza, High Dose Seasonal PF 02/16/2017, 02/11/2018, 01/31/2019, 02/27/2020   Influenza,inj,Quad PF,6+ Mos 02/09/2015, 12/18/2015   Influenza-Unspecified 02/09/2015, 12/18/2015   PFIZER Comirnaty(Gray Top)Covid-19 Tri-Sucrose Vaccine 07/24/2019, 08/14/2019, 02/27/2020   PFIZER(Purple Top)SARS-COV-2 Vaccination 07/17/2019, 08/14/2019   PPD Test 04/23/2015   Pneumococcal Conjugate-13 08/21/2014   Pneumococcal Polysaccharide-23 02/17/2012   Tdap 12/27/2015    TDAP status: Up to date    Pneumococcal vaccine status: Up to date  Covid-19 vaccine status: Completed vaccines  Qualifies for Shingles Vaccine? Yes   Zostavax completed No   Shingrix Completed?: No.    Education has been provided regarding the importance of this vaccine. Patient has been advised to call insurance company  to determine out of pocket expense if they have not yet received this vaccine. Advised may also receive vaccine at local pharmacy or Health Dept. Verbalized acceptance and understanding.  Screening Tests Health Maintenance  Topic Date Due   Diabetic kidney evaluation - Urine ACR  Never done   FOOT EXAM  05/23/2020   COVID-19 Vaccine (6 - 2023-24 season) 11/12/2022 (Originally 01/17/2022)    Zoster Vaccines- Shingrix (1 of 2) 01/27/2023 (Originally 11/25/1959)   OPHTHALMOLOGY EXAM  02/15/2023   HEMOGLOBIN A1C  03/05/2023   Diabetic kidney evaluation - eGFR measurement  09/29/2023   Medicare Annual Wellness (AWV)  10/27/2023   DTaP/Tdap/Td (2 - Td or Tdap) 12/26/2025   Pneumonia Vaccine 18+ Years old  Completed   DEXA SCAN  Completed   HPV VACCINES  Aged Out   INFLUENZA VACCINE  Discontinued    Health Maintenance  Health Maintenance Due  Topic Date Due   Diabetic kidney evaluation - Urine ACR  Never done   FOOT EXAM  05/23/2020    Colorectal cancer screening: No longer required.   Mammogram status: No longer required due to Age.  Bone Density status: Ordered 08/14/22. Pt provided with contact info and advised to call to schedule appt.  Lung Cancer Screening: (Low Dose CT Chest recommended if Age 49-80 years, 30 pack-year currently smoking OR have quit w/in 15years.) does not qualify.     Additional Screening:  Hepatitis C Screening: does not qualify; Completed   Vision Screening: Recommended annual ophthalmology exams for early detection of glaucoma and other disorders of the eye. Is the patient up to date with their annual eye exam?  Yes  Who is the provider or what is the name of the office in which the patient attends annual eye exams? [facility] If pt is not established with a provider, would they like to be referred to a provider to establish care? No .   Dental Screening: Recommended annual dental exams for proper oral hygiene  Community Resource Referral / Chronic Care Management:  CRR required this visit?  No   CCM required this visit?  No      Plan:     I have personally reviewed and noted the following in the patient's chart:   Medical and social history Use of alcohol, tobacco or illicit drugs  Current medications and supplements including opioid prescriptions. Patient is not currently taking opioid prescriptions. Functional ability  and status Nutritional status Physical activity Advanced directives List of other physicians Hospitalizations, surgeries, and ER visits in previous 12 months Vitals Screenings to include cognitive, depression, and falls Referrals and appointments  In addition, I have reviewed and discussed with patient certain preventive protocols, quality metrics, and best practice recommendations. A written personalized care plan for preventive services as well as general preventive health recommendations were provided to patient.     Tillie Rung, LPN   1/61/0960   Nurse Notes:   None

## 2022-10-27 NOTE — Patient Instructions (Addendum)
[name] , Thank you for taking time to come for your Medicare Wellness Visit. I appreciate your ongoing commitment to your health goals. Please review the following plan we discussed and let me know if I can assist you in the future.   These are the goals we discussed:  Goals       Patient stated (pt-stated)      Move more with less pain.      Patient stated (pt-stated)      I would like to Stand and move better        This is a list of the screening recommended for you and due dates:  Health Maintenance  Topic Date Due   Yearly kidney health urinalysis for diabetes  Never done   Complete foot exam   05/23/2020   COVID-19 Vaccine (6 - 2023-24 season) 11/12/2022*   Zoster (Shingles) Vaccine (1 of 2) 01/27/2023*   Eye exam for diabetics  02/15/2023   Hemoglobin A1C  03/05/2023   Yearly kidney function blood test for diabetes  09/29/2023   Medicare Annual Wellness Visit  10/27/2023   DTaP/Tdap/Td vaccine (2 - Td or Tdap) 12/26/2025   Pneumonia Vaccine  Completed   DEXA scan (bone density measurement)  Completed   HPV Vaccine  Aged Out   Flu Shot  Discontinued  *Topic was postponed. The date shown is not the original due date.    Advanced directives: In chart  Conditions/risks identified: None  Next appointment: Follow up in one year for your annual wellness visit    Preventive Care 65 Years and Older, Female Preventive care refers to lifestyle choices and visits with your health care provider that can promote health and wellness. What does preventive care include? A yearly physical exam. This is also called an annual well check. Dental exams once or twice a year. Routine eye exams. Ask your health care provider how often you should have your eyes checked. Personal lifestyle choices, including: Daily care of your teeth and gums. Regular physical activity. Eating a healthy diet. Avoiding tobacco and drug use. Limiting alcohol use. Practicing safe sex. Taking  low-dose aspirin every day. Taking vitamin and mineral supplements as recommended by your health care provider. What happens during an annual well check? The services and screenings done by your health care provider during your annual well check will depend on your age, overall health, lifestyle risk factors, and family history of disease. Counseling  Your health care provider may ask you questions about your: Alcohol use. Tobacco use. Drug use. Emotional well-being. Home and relationship well-being. Sexual activity. Eating habits. History of falls. Memory and ability to understand (cognition). Work and work Astronomer. Reproductive health. Screening  You may have the following tests or measurements: Height, weight, and BMI. Blood pressure. Lipid and cholesterol levels. These may be checked every 5 years, or more frequently if you are over 18 years old. Skin check. Lung cancer screening. You may have this screening every year starting at age 29 if you have a 30-pack-year history of smoking and currently smoke or have quit within the past 15 years. Fecal occult blood test (FOBT) of the stool. You may have this test every year starting at age 80. Flexible sigmoidoscopy or colonoscopy. You may have a sigmoidoscopy every 5 years or a colonoscopy every 10 years starting at age 28. Hepatitis C blood test. Hepatitis B blood test. Sexually transmitted disease (STD) testing. Diabetes screening. This is done by checking your blood sugar (glucose) after you  have not eaten for a while (fasting). You may have this done every 1-3 years. Bone density scan. This is done to screen for osteoporosis. You may have this done starting at age 43. Mammogram. This may be done every 1-2 years. Talk to your health care provider about how often you should have regular mammograms. Talk with your health care provider about your test results, treatment options, and if necessary, the need for more tests. Vaccines   Your health care provider may recommend certain vaccines, such as: Influenza vaccine. This is recommended every year. Tetanus, diphtheria, and acellular pertussis (Tdap, Td) vaccine. You may need a Td booster every 10 years. Zoster vaccine. You may need this after age 80. Pneumococcal 13-valent conjugate (PCV13) vaccine. One dose is recommended after age 75. Pneumococcal polysaccharide (PPSV23) vaccine. One dose is recommended after age 90. Talk to your health care provider about which screenings and vaccines you need and how often you need them. This information is not intended to replace advice given to you by your health care provider. Make sure you discuss any questions you have with your health care provider. Document Released: 06/01/2015 Document Revised: 01/23/2016 Document Reviewed: 03/06/2015 Elsevier Interactive Patient Education  2017 [place] Prevention in the Home Falls can cause injuries. They can happen to people of all ages. There are many things you can do to make your home safe and to help prevent falls. What can I do on the outside of my home? Regularly fix the edges of walkways and driveways and fix any cracks. Remove anything that might make you trip as you walk through a door, such as a raised step or threshold. Trim any bushes or trees on the path to your home. Use bright outdoor lighting. Clear any walking paths of anything that might make someone trip, such as rocks or tools. Regularly check to see if handrails are loose or broken. Make sure that both sides of any steps have handrails. Any raised decks and porches should have guardrails on the edges. Have any leaves, snow, or ice cleared regularly. Use sand or salt on walking paths during winter. Clean up any spills in your garage right away. This includes oil or grease spills. What can I do in the bathroom? Use night lights. Install grab bars by the toilet and in the tub and shower. Do not use towel  bars as grab bars. Use non-skid mats or decals in the tub or shower. If you need to sit down in the shower, use a plastic, non-slip stool. Keep the floor dry. Clean up any water that spills on the floor as soon as it happens. Remove soap buildup in the tub or shower regularly. Attach bath mats securely with double-sided non-slip rug tape. Do not have throw rugs and other things on the floor that can make you trip. What can I do in the bedroom? Use night lights. Make sure that you have a light by your bed that is easy to reach. Do not use any sheets or blankets that are too big for your bed. They should not hang down onto the floor. Have a firm chair that has side arms. You can use this for support while you get dressed. Do not have throw rugs and other things on the floor that can make you trip. What can I do in the kitchen? Clean up any spills right away. Avoid walking on wet floors. Keep items that you use a lot in easy-to-reach places. If you need  to reach something above you, use a strong step stool that has a grab bar. Keep electrical cords out of the way. Do not use floor polish or wax that makes floors slippery. If you must use wax, use non-skid floor wax. Do not have throw rugs and other things on the floor that can make you trip. What can I do with my stairs? Do not leave any items on the stairs. Make sure that there are handrails on both sides of the stairs and use them. Fix handrails that are broken or loose. Make sure that handrails are as long as the stairways. Check any carpeting to make sure that it is firmly attached to the stairs. Fix any carpet that is loose or worn. Avoid having throw rugs at the top or bottom of the stairs. If you do have throw rugs, attach them to the floor with carpet tape. Make sure that you have a light switch at the top of the stairs and the bottom of the stairs. If you do not have them, ask someone to add them for you. What else can I do to help  prevent falls? Wear shoes that: Do not have high heels. Have rubber bottoms. Are comfortable and fit you well. Are closed at the toe. Do not wear sandals. If you use a stepladder: Make sure that it is fully opened. Do not climb a closed stepladder. Make sure that both sides of the stepladder are locked into place. Ask someone to hold it for you, if possible. Clearly mark and make sure that you can see: Any grab bars or handrails. First and last steps. Where the edge of each step is. Use tools that help you move around (mobility aids) if they are needed. These include: Canes. Walkers. Scooters. Crutches. Turn on the lights when you go into a dark area. Replace any light bulbs as soon as they burn out. Set up your furniture so you have a clear path. Avoid moving your furniture around. If any of your floors are uneven, fix them. If there are any pets around you, be aware of where they are. Review your medicines with your doctor. Some medicines can make you feel dizzy. This can increase your chance of falling. Ask your doctor what other things that you can do to help prevent falls. This information is not intended to replace advice given to you by your health care provider. Make sure you discuss any questions you have with your health care provider. Document Released: 03/01/2009 Document Revised: 10/11/2015 Document Reviewed: 06/09/2014 Elsevier Interactive Patient Education  2017 [employer].

## 2022-11-07 ENCOUNTER — Telehealth: Payer: Self-pay | Admitting: Pharmacist

## 2022-11-07 NOTE — Progress Notes (Signed)
Contacted patient regarding upcoming appointment with [facility] pharmacist. Patient would benefit from or requests a future appointment with clinical pharmacist for hypertension. Appointment rescheduled.   Catie T. Ashonti Leandro, PharmD, BCACP, CPP Clinical Pharmacist [facility] [phone]  

## 2022-11-11 DIAGNOSIS — Z79899 Other long term (current) drug therapy: Secondary | ICD-10-CM | POA: Diagnosis not present

## 2022-11-11 DIAGNOSIS — M5442 Lumbago with sciatica, left side: Secondary | ICD-10-CM | POA: Diagnosis not present

## 2022-11-11 DIAGNOSIS — M0579 Rheumatoid arthritis with rheumatoid factor of multiple sites without organ or systems involvement: Secondary | ICD-10-CM | POA: Diagnosis not present

## 2022-11-11 DIAGNOSIS — M1A00X0 Idiopathic chronic gout, unspecified site, without tophus (tophi): Secondary | ICD-10-CM | POA: Diagnosis not present

## 2022-11-11 DIAGNOSIS — G8929 Other chronic pain: Secondary | ICD-10-CM | POA: Diagnosis not present

## 2022-11-11 DIAGNOSIS — Z7960 Long term (current) use of unspecified immunomodulators and immunosuppressants: Secondary | ICD-10-CM | POA: Diagnosis not present

## 2022-11-13 ENCOUNTER — Telehealth: Payer: Self-pay | Admitting: Registered Nurse

## 2022-11-13 NOTE — Telephone Encounter (Signed)
Patient called in requesting to know what she can do , patient has been having burning pain and her rheumatologist told her it is nerve pain in her right leg and it is radiating all the way down .

## 2022-11-13 NOTE — Telephone Encounter (Signed)
Return [name] call , in the past she was prescribed Pregabalin with no relief.   She is currently on Gabapentin   In the past she had ESI with Dr Alvester Morin 10/22/2021. She is currently on Eliquis, she will call Dr Alvester Morin office to schedule appointment.   She will return this provider call with update, she verbalizes understanding.

## 2022-11-27 ENCOUNTER — Encounter: Payer: Self-pay | Admitting: Registered Nurse

## 2022-11-27 ENCOUNTER — Encounter: Payer: Medicare Other | Attending: Physical Medicine & Rehabilitation | Admitting: Registered Nurse

## 2022-11-27 VITALS — BP 106/72 | HR 92 | Ht 64.0 in | Wt 229.0 lb

## 2022-11-27 DIAGNOSIS — Z5181 Encounter for therapeutic drug level monitoring: Secondary | ICD-10-CM | POA: Insufficient documentation

## 2022-11-27 DIAGNOSIS — M25511 Pain in right shoulder: Secondary | ICD-10-CM | POA: Insufficient documentation

## 2022-11-27 DIAGNOSIS — G894 Chronic pain syndrome: Secondary | ICD-10-CM | POA: Diagnosis not present

## 2022-11-27 DIAGNOSIS — G8929 Other chronic pain: Secondary | ICD-10-CM | POA: Diagnosis not present

## 2022-11-27 DIAGNOSIS — Z79891 Long term (current) use of opiate analgesic: Secondary | ICD-10-CM | POA: Insufficient documentation

## 2022-11-27 DIAGNOSIS — M5416 Radiculopathy, lumbar region: Secondary | ICD-10-CM | POA: Diagnosis not present

## 2022-11-27 DIAGNOSIS — M545 Low back pain, unspecified: Secondary | ICD-10-CM | POA: Diagnosis not present

## 2022-11-27 DIAGNOSIS — M170 Bilateral primary osteoarthritis of knee: Secondary | ICD-10-CM | POA: Diagnosis not present

## 2022-11-27 DIAGNOSIS — M7061 Trochanteric bursitis, right hip: Secondary | ICD-10-CM | POA: Insufficient documentation

## 2022-11-27 DIAGNOSIS — M7062 Trochanteric bursitis, left hip: Secondary | ICD-10-CM | POA: Insufficient documentation

## 2022-11-27 DIAGNOSIS — M2550 Pain in unspecified joint: Secondary | ICD-10-CM | POA: Diagnosis not present

## 2022-11-27 MED ORDER — MORPHINE SULFATE 15 MG PO TABS: 15.0000 mg | ORAL_TABLET | Freq: Two times a day (BID) | ORAL | 0 refills | Status: DC

## 2022-11-27 MED ORDER — GABAPENTIN 600 MG PO TABS
1200.0000 mg | ORAL_TABLET | Freq: Three times a day (TID) | ORAL | 1 refills | Status: DC
Start: 2022-11-27 — End: 2023-05-25

## 2022-11-27 MED ORDER — MORPHINE SULFATE ER 15 MG PO TBCR: 15.0000 mg | EXTENDED_RELEASE_TABLET | Freq: Two times a day (BID) | ORAL | 0 refills | Status: DC

## 2022-11-27 MED ORDER — DULOXETINE HCL 60 MG PO CPEP
60.0000 mg | ORAL_CAPSULE | Freq: Every day | ORAL | 2 refills | Status: DC
Start: 2022-11-27 — End: 2023-08-04

## 2022-11-27 MED ORDER — CYCLOBENZAPRINE HCL 5 MG PO TABS
5.0000 mg | ORAL_TABLET | Freq: Every evening | ORAL | 2 refills | Status: DC | PRN
Start: 2022-11-27 — End: 2023-06-26

## 2022-11-27 NOTE — Progress Notes (Addendum)
Subjective:    Patient ID: [name], female    DOB: [birth year], 82 y.o.   MRN: [id number]  HPI: [name] is a 82 y.o. female who returns for follow up appointment for chronic pain and medication refill. She states her pain is located in her  right shoulder radiating into her right arm, right pinky, right index finger, right middle and right finger with tingling and burning, she reports Orth following. She also reports lower back pain radiating into her bilateral hips and bilateral lower extremities and bilateral knee pain R>L. Also reports generalized joint pain She rates her pain 7.Her  current exercise regime is using her peddler daily for 30- 40 minutes and walking with her walker in her home. She arrived in wheelchair.   Ms. Mcnamara Morphine equivalent is 60.00 MME.   Last Oral Swab was Performed on 07/28/2022, it was consistent.     Pain Inventory Average Pain 7 Pain Right Now 7 My pain is intermittent, sharp, burning, and tingling  In the last 24 hours, has pain interfered with the following? General activity 7 Relation with others 7 Enjoyment of life 7 What TIME of day is your pain at its worst? morning  and daytime Sleep (in general) Fair  Pain is worse with: walking, bending, and standing Pain improves with: heat/ice and medication Relief from Meds: 5  Family History  Problem Relation Age of Onset  . Diabetes Mother   . Heart attack Mother   . Hypertension Father   . Lung cancer Father   . Diabetes Sister   . Breast cancer Sister   . Breast cancer Sister   . Diabetes Brother   . Hypertension Brother   . Heart disease Brother   . Heart attack Brother   . Kidney cancer Brother   . Gout Brother   . Kidney failure Brother        x 5  . Esophageal cancer Brother   . Stomach cancer Brother   . Prostate cancer Brother   . Uterine cancer Daughter        with mets  . Fibroids Daughter   . Hypertension Daughter   . Hypertension Daughter   .  Hypertension Son   . Heart murmur Son   . Rheum arthritis Maternal Uncle   . Alzheimer's disease Neg Hx   . Dementia Neg Hx    Social History   Socioeconomic History  . Marital status: Married    Spouse name: Not on file  . Number of children: 6  . Years of education: college  . Highest education level: Not on file  Occupational History  . Occupation: retired Engineer, civil (consulting)  Tobacco Use  . Smoking status: Never  . Smokeless tobacco: Never  Vaping Use  . Vaping status: Never Used  Substance and Sexual Activity  . Alcohol use: No  . Drug use: No  . Sexual activity: Not Currently  Other Topics Concern  . Not on file  Social History Narrative   Patient consumes 2-3 cups coffee per day.   Social Determinants of Health   Financial Resource Strain: Low Risk  (10/27/2022)   Overall Financial Resource Strain (CARDIA)   . Difficulty of Paying Living Expenses: Not hard at all  Food Insecurity: No Food Insecurity (10/27/2022)   Hunger Vital Sign   . Worried About Programme researcher, broadcasting/film/video in the Last Year: Never true   . Ran Out of Food in the Last Year: Never true  Transportation Needs:  Unmet Transportation Needs (05/28/2022)   PRAPARE - Transportation   . Lack of Transportation (Medical): Yes   . Lack of Transportation (Non-Medical): No  Physical Activity: Insufficiently Active (10/27/2022)   Exercise Vital Sign   . Days of Exercise per Week: 7 days   . Minutes of Exercise per Session: 20 min  Stress: No Stress Concern Present (10/27/2022)   [employer] of Occupational Health - Occupational Stress Questionnaire   . Feeling of Stress : Not at all  Social Connections: Socially Integrated (10/27/2022)   Social Connection and Isolation Panel [NHANES]   . Frequency of Communication with Friends and Family: More than three times a week   . Frequency of Social Gatherings with Friends and Family: More than three times a week   . Attends Religious Services: More than 4 times per year   . Active  Member of Clubs or Organizations: Yes   . Attends Banker Meetings: More than 4 times per year   . Marital Status: Married   Past Surgical History:  Procedure Laterality Date  . BRAIN SURGERY     Gamma knife 10/13. Needs repeat spring  '14  . CARDIAC CATHETERIZATION N/A 10/31/2014   Procedure: Right/Left Heart Cath and Coronary Angiography;  Surgeon: Corky Crafts, MD;  [facility] INVASIVE CV LAB;  Service: Cardiovascular;  Laterality: N/A;  . COLONOSCOPY WITH PROPOFOL N/A 04/14/2022   Procedure: COLONOSCOPY WITH PROPOFOL;  Surgeon: Beverley Fiedler, MD;  Location: [facility] ENDOSCOPY;  Service: Gastroenterology;  Laterality: N/A;  . CYST EXCISION     2 on face  . CYST REMOVAL NECK    . ESOPHAGOGASTRODUODENOSCOPY (EGD) WITH PROPOFOL N/A 09/14/2014   Procedure: ESOPHAGOGASTRODUODENOSCOPY (EGD) WITH PROPOFOL;  Surgeon: Louis Meckel, MD;  Location: [facility] ENDOSCOPY;  Service: Endoscopy;  Laterality: N/A;  . INCISION AND DRAINAGE HIP Left 01/16/2017   Procedure: IRRIGATION AND DEBRIDEMENT LEFT HIP;  Surgeon: Kathryne Hitch, MD;  Location: [facility] ORS;  Service: Orthopedics;  Laterality: Left;  . INTRAMEDULLARY (IM) NAIL INTERTROCHANTERIC Left 11/29/2016   Procedure: INTRAMEDULLARY (IM) NAIL INTERTROCHANTRIC;  Surgeon: Kathryne Hitch, MD;  Location: [facility] OR;  Service: Orthopedics;  Laterality: Left;  . OVARY SURGERY    . RIGHT HEART CATH N/A 08/12/2021   Procedure: RIGHT HEART CATH;  Surgeon: Runell Gess, MD;  Location: [facility] INVASIVE CV LAB;  Service: Cardiovascular;  Laterality: N/A;  . SHOULDER SURGERY Left   . TONSILLECTOMY  age 37  . VAGINAL HYSTERECTOMY    . VIDEO BRONCHOSCOPY Bilateral 05/31/2013   Procedure: VIDEO BRONCHOSCOPY WITHOUT FLUORO;  Surgeon: Kalman Shan, MD;  Location[facility] ENDOSCOPY;  Service: Cardiopulmonary;  Laterality: Bilateral;  . video bronscoscopy  05/19/1998   lung   Past Surgical History:  Procedure Laterality Date  . BRAIN SURGERY      Gamma knife 10/13. Needs repeat spring  '14  . CARDIAC CATHETERIZATION N/A 10/31/2014   Procedure: Right/Left Heart Cath and Coronary Angiography;  Surgeon: Corky Crafts, MD;  Location: [facility] INVASIVE CV LAB;  Service: Cardiovascular;  Laterality: N/A;  . COLONOSCOPY WITH PROPOFOL N/A 04/14/2022   Procedure: COLONOSCOPY WITH PROPOFOL;  Surgeon: Beverley Fiedler, MD;  Location: [facility] ENDOSCOPY;  Service: Gastroenterology;  Laterality: N/A;  . CYST EXCISION     2 on face  . CYST REMOVAL NECK    . ESOPHAGOGASTRODUODENOSCOPY (EGD) WITH PROPOFOL N/A 09/14/2014   Procedure: ESOPHAGOGASTRODUODENOSCOPY (EGD) WITH PROPOFOL;  Surgeon: Louis Meckel, MD;  Location: [facility] ENDOSCOPY;  Service: Endoscopy;  Laterality: N/A;  . INCISION AND DRAINAGE HIP Left 01/16/2017   Procedure: IRRIGATION AND DEBRIDEMENT LEFT HIP;  Surgeon: Kathryne Hitch, MD;  Location: [facility] ORS;  Service: Orthopedics;  Laterality: Left;  . INTRAMEDULLARY (IM) NAIL INTERTROCHANTERIC Left 11/29/2016   Procedure: INTRAMEDULLARY (IM) NAIL INTERTROCHANTRIC;  Surgeon: Kathryne Hitch, MD;  Location: [facility] OR;  Service: Orthopedics;  Laterality: Left;  . OVARY SURGERY    . RIGHT HEART CATH N/A 08/12/2021   Procedure: RIGHT HEART CATH;  Surgeon: Runell Gess, MD;  Location[facility] INVASIVE CV LAB;  Service: Cardiovascular;  Laterality: N/A;  . SHOULDER SURGERY Left   . TONSILLECTOMY  age 51  . VAGINAL HYSTERECTOMY    . VIDEO BRONCHOSCOPY Bilateral 05/31/2013   Procedure: VIDEO BRONCHOSCOPY WITHOUT FLUORO;  Surgeon: Kalman Shan, MD;  Location: [facility] ENDOSCOPY;  Service: Cardiopulmonary;  Laterality: Bilateral;  . video bronscoscopy  05/19/1998   lung   Past Medical History:  Diagnosis Date  . Acute on chronic diastolic (congestive) heart failure (HCC)   . Anemia    iron deficiency anemia - secondary to blood loss ( chronic)   . Anxiety   . Arthritis    endstage changes bilateral knees/bilateral ankles.   . Asthma   .  Carotid artery occlusion   . Chronic fatigue   . Chronic kidney disease   . Closed left hip fracture (HCC)   . Clotting disorder (HCC)    pt denies this  . Contusion of left knee    due to fall 1/14.  [name] COPD (chronic obstructive pulmonary disease) (HCC)    pulmonary fibrosis  . Depression, reactive   . Diabetes mellitus    type II   . Diastolic dysfunction   . Difficulty in walking   . Family history of heart disease   . Generalized muscle weakness   . Gout   . High cholesterol   . History of falling   . Hypertension   . Hypothyroidism   . Interstitial lung disease (HCC)   . 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    . Morbid obesity (HCC)   . Neuromuscular disorder (HCC)    diabetic neuropathy   . Normal coronary arteries    cardiac catheterization performed  10/31/14  . Pneumonia   . RA (rheumatoid arthritis) (HCC)    has been off methotreaxte since 10/13.  [name] Spinal stenosis of lumbar region   . Thyroid disease   . Unspecified lack of coordination   . URI (upper respiratory infection)    Ht 5\' 4"  (1.626 m)   BMI 39.31 kg/m   Opioid Risk Score:   Fall Risk Score:  `1  Depression screen PHQ 2/9     10/27/2022    1:46 PM 09/22/2022   11:27 AM 09/03/2022   10:10 AM 07/28/2022    1:29 PM 06/02/2022   10:12 AM 02/10/2022   11:39 AM 12/20/2021    3:26 PM  Depression screen PHQ 2/9  Decreased Interest 0 0 0 0 0 1 2  Down, Depressed, Hopeless 0 0 1 0 0 1 2  PHQ - 2 Score 0 0 1 0 0 2 4  Altered sleeping 0  1    1  Tired, decreased energy 0  1   2 1   Change in appetite 0  0   0 1  Feeling bad or failure about yourself  0  1  0 2  Trouble concentrating 0  0   0 2  Moving slowly or fidgety/restless 0  1   0 2  Suicidal thoughts 0  0   0 0  PHQ-9 Score 0  5    13  Difficult doing work/chores      Very difficult Very difficult     Review of Systems  Musculoskeletal:  Positive  for back pain.       RT leg Rt arm/hand Rt shoulder pain b/l knee pain  All other systems reviewed and are negative.      Objective:   Physical Exam Vitals and nursing note reviewed.  Constitutional:      Appearance: Normal appearance.  Cardiovascular:     Rate and Rhythm: Normal rate and regular rhythm.     Pulses: Normal pulses.     Heart sounds: Normal heart sounds.  Pulmonary:     Effort: Pulmonary effort is normal.     Breath sounds: Normal breath sounds.  Musculoskeletal:     Cervical back: Normal range of motion and neck supple.     Right lower leg: Edema present.     Left lower leg: Edema present.     Comments: Normal Muscle Bulk and Muscle Testing Reveals:  Upper Extremities: Right: Decreased ROM 30 Degrees  and Muscle Strength  5/5 Left Upper extremity: Full ROM and Muscle Strength 5/5 Right AC Joint Tenderness Thoracic Hypersensitivity: T-1-T-7 Lumbar Paraspinal Tenderness: L-3-L-5 Bilateral Greater Trochanter Tenderness Lower Extremities: Decreased ROM and Muscle Strength 5/5 Right Lower Extremity Flexion Produces Pain into her Right Lower Extremity  Arrived in wheelchair      Skin:    General: Skin is warm and dry.  Neurological:     Mental Status: She is alert and oriented to person, place, and time.  Psychiatric:        Mood and Affect: Mood normal.        Behavior: Behavior normal.         Assessment & Plan:  1. Lumbar spinal stenosis with neurogenic claudication. Associated facet arthropathy/ Lumbar Radiculitis: Continue current medication regimen with Gabapentin and HEP as tolerated. 11/27/2022 Refilled: MS Contin 15 mg one tablet every 12 hours #60 and  MSIR 15 one tablet twice a day as needed  For break through pain ( 6 am and 1:00 pm)  #60.since adverse effect with insomnia with Nucynta. Nucynta discontinued due to insomnia.  We will continue the opioid monitoring program, this consists of regular clinic visits, examinations, urine drug screen,  pill counts as well as use of [place] Controlled Substance Reporting system. A 12 month History has been reviewed on the [place] Controlled Substance Reporting System 11/27/2022 2. Chronic Bilateral Thoracic Back Pain: Continue HEP as Tolerated. Continue to Monitor. 11/27/2022 3 Depression: Continue current medication Regime: Cymbalta . Continue to monitor.07/111/2024 4. Diabetes mellitus type 2 with polyneuropathy: Continue current medication regime: Gabapentin. 11/27/2022 5. Rheumatoid arthritis and osteoarthritis. Continue current medication Regime.  Voltaren Gel. Rheumatology Following. 09/22/2022 6. Interstitial lung disease: Pulmonology Following. 11/27/2022. 7. Bilateral Osteoarthritis Knee's:  Continue current medication regimen. Voltaren Gel. 11/27/2022. 8. Muscle Spasm: Continue current medication regime : Flexeril. Continue to monitor.11/27/2022 9. Bilateral Greater Trochanteric Tenderness: Continue current treatment with Ice/Heat Therapy. 11/27/2022. 10. Polyarthralgia: Rheumatology Following: Continue to monitor.11/27/2022 11. Morbid Obesitity: Continue Healthy Diet Regime and HEP. Continue to monitor.  11/27/2022 12. Cervicalgia/Cervical Radiculitis: No complaints today. Continue to Alternate with Heat and Ice Therapy. Continue to monitor. 11/27/2022  F/U in 2 months

## 2022-11-28 ENCOUNTER — Ambulatory Visit: Payer: Medicare Other | Admitting: Family Medicine

## 2022-11-28 ENCOUNTER — Encounter: Payer: Self-pay | Admitting: Family Medicine

## 2022-11-28 VITALS — BP 108/76 | HR 90 | Temp 97.9°F | Ht 64.0 in | Wt 229.0 lb

## 2022-11-28 DIAGNOSIS — M19011 Primary osteoarthritis, right shoulder: Secondary | ICD-10-CM

## 2022-11-28 DIAGNOSIS — I10 Essential (primary) hypertension: Secondary | ICD-10-CM

## 2022-11-28 DIAGNOSIS — M545 Low back pain, unspecified: Secondary | ICD-10-CM

## 2022-11-28 DIAGNOSIS — J302 Other seasonal allergic rhinitis: Secondary | ICD-10-CM | POA: Diagnosis not present

## 2022-11-28 DIAGNOSIS — I5032 Chronic diastolic (congestive) heart failure: Secondary | ICD-10-CM | POA: Diagnosis not present

## 2022-11-28 DIAGNOSIS — M05742 Rheumatoid arthritis with rheumatoid factor of left hand without organ or systems involvement: Secondary | ICD-10-CM | POA: Diagnosis not present

## 2022-11-28 DIAGNOSIS — E039 Hypothyroidism, unspecified: Secondary | ICD-10-CM | POA: Diagnosis not present

## 2022-11-28 DIAGNOSIS — Z7901 Long term (current) use of anticoagulants: Secondary | ICD-10-CM | POA: Diagnosis not present

## 2022-11-28 DIAGNOSIS — M05741 Rheumatoid arthritis with rheumatoid factor of right hand without organ or systems involvement: Secondary | ICD-10-CM

## 2022-11-28 DIAGNOSIS — N183 Chronic kidney disease, stage 3 unspecified: Secondary | ICD-10-CM

## 2022-11-28 DIAGNOSIS — G8929 Other chronic pain: Secondary | ICD-10-CM

## 2022-11-28 DIAGNOSIS — J849 Interstitial pulmonary disease, unspecified: Secondary | ICD-10-CM

## 2022-11-28 DIAGNOSIS — E1140 Type 2 diabetes mellitus with diabetic neuropathy, unspecified: Secondary | ICD-10-CM | POA: Diagnosis not present

## 2022-11-28 LAB — COMPREHENSIVE METABOLIC PANEL
ALT: 25 U/L (ref 0–35)
AST: 16 U/L (ref 0–37)
Albumin: 4 g/dL (ref 3.5–5.2)
Alkaline Phosphatase: 78 U/L (ref 39–117)
BUN: 19 mg/dL (ref 6–23)
CO2: 33 mEq/L — ABNORMAL HIGH (ref 19–32)
Calcium: 8.9 mg/dL (ref 8.4–10.5)
Chloride: 99 mEq/L (ref 96–112)
Creatinine, Ser: 0.98 mg/dL (ref 0.40–1.20)
GFR: 54.32 mL/min — ABNORMAL LOW (ref >60.00)
Glucose, Bld: 114 mg/dL — ABNORMAL HIGH (ref 70–99)
Potassium: 3.7 mEq/L (ref 3.5–5.1)
Sodium: 141 mEq/L (ref 135–145)
Total Bilirubin: 0.4 mg/dL (ref 0.2–1.2)
Total Protein: 6.5 g/dL (ref 6.0–8.3)

## 2022-11-28 LAB — CBC WITH DIFFERENTIAL/PLATELET
Basophils Absolute: 0.1 10*3/uL (ref 0.0–0.1)
Basophils Relative: 0.8 % (ref 0.0–3.0)
Eosinophils Absolute: 0.2 10*3/uL (ref 0.0–0.7)
Eosinophils Relative: 3.4 % (ref 0.0–5.0)
HCT: 39 % (ref 36.0–46.0)
Hemoglobin: 12.1 g/dL (ref 12.0–15.0)
Lymphocytes Relative: 34.5 % (ref 12.0–46.0)
Lymphs Abs: 2.4 10*3/uL (ref 0.7–4.0)
MCHC: 31.1 g/dL (ref 30.0–36.0)
MCV: 96.5 fl (ref 78.0–100.0)
Monocytes Absolute: 0.8 10*3/uL (ref 0.1–1.0)
Monocytes Relative: 11 % (ref 3.0–12.0)
Neutro Abs: 3.5 10*3/uL (ref 1.4–7.7)
Neutrophils Relative %: 50.3 % (ref 43.0–77.0)
Platelets: 172 10*3/uL (ref 150.0–400.0)
RBC: 4.05 Mil/uL (ref 3.87–5.11)
RDW: 16.1 % — ABNORMAL HIGH (ref 11.5–15.5)
WBC: 7 10*3/uL (ref 4.0–10.5)

## 2022-11-28 LAB — TSH: TSH: 1.91 u[IU]/mL (ref 0.35–5.50)

## 2022-11-28 LAB — MICROALBUMIN / CREATININE URINE RATIO
Creatinine,U: 23.5 mg/dL
Microalb Creat Ratio: 3 mg/g (ref 0.0–30.0)
Microalb, Ur: <0.7 mg/dL (ref 0.0–1.9)

## 2022-11-28 MED ORDER — OLMESARTAN MEDOXOMIL 40 MG PO TABS
40.0000 mg | ORAL_TABLET | Freq: Every day | ORAL | 3 refills | Status: DC
Start: 2022-11-28 — End: 2023-06-26

## 2022-11-28 MED ORDER — METOPROLOL SUCCINATE ER 25 MG PO TB24
ORAL_TABLET | ORAL | 3 refills | Status: DC
Start: 2022-11-28 — End: 2023-06-26

## 2022-11-28 MED ORDER — LEVOTHYROXINE SODIUM 50 MCG PO TABS
50.0000 ug | ORAL_TABLET | Freq: Every day | ORAL | 3 refills | Status: DC
Start: 2022-11-28 — End: 2024-02-15

## 2022-11-28 MED ORDER — FLUTICASONE PROPIONATE 50 MCG/ACT NA SUSP
1.0000 | Freq: Every day | NASAL | 3 refills | Status: DC
Start: 2022-11-28 — End: 2023-08-04

## 2022-11-28 MED ORDER — POTASSIUM CHLORIDE ER 20 MEQ PO TBCR
20.0000 meq | EXTENDED_RELEASE_TABLET | Freq: Every day | ORAL | 3 refills | Status: DC
Start: 2022-11-28 — End: 2023-06-26

## 2022-11-28 NOTE — Patient Instructions (Addendum)
You just had your hemoglobin A1c and lipid panel checked a month or so ago so I did not reorder these labs.  Lab orders were placed and refilled sent to your pharmacy.   Consider contacting [facility] to discuss interventional pain management options.  They have an office located in [place] at [address] phone number is [phone]

## 2022-11-28 NOTE — Progress Notes (Signed)
Established Patient Office Visit   Subjective  Patient ID: [name], female    DOB: 1940/09/13  Age: 82 y.o. MRN: [id number]  Chief Complaint  Patient presents with   Annual Exam    Right side shoulder, arm and leg.     Patient is an 82 year old female with significant past medical history seen today for follow-up.  Requesting refills.  Had [facility] visit a few months ago.  Had recent follow-up with cardiology, pulmonology, and other specialist.  Rheumatoid arthritis symptoms worsening.  Right hand more painful with use and slightly deformed.  Still dealing with chronic back pain and right leg pain.  Patient states she is at the maximum dose of morphine per [facility] pain management, Dr. Hermelinda Medicus.  Patient tried to contact local acupuncturist but did not receive a return phone call.  Patient states her breathing is doing good.  Has O2 with her but is not currently wearing.  Typically using 3 L via [place].  May be developing an intolerance to bananas as started developing nausea, emesis, and diarrhea after eating.   Past Medical History:  Diagnosis Date   Acute on chronic diastolic (congestive) heart failure (HCC)    Anemia    iron deficiency anemia - secondary to blood loss ( chronic)    Anxiety    Arthritis    endstage changes bilateral knees/bilateral ankles.    Asthma    Carotid artery occlusion    Chronic fatigue    Chronic kidney disease    Closed left hip fracture (HCC)    Clotting disorder (HCC)    pt denies this   Contusion of left knee    due to fall 1/14.   COPD (chronic obstructive pulmonary disease) (HCC)    pulmonary fibrosis   Depression, reactive    Diabetes mellitus    type II    Diastolic dysfunction    Difficulty in walking    Family history of heart disease    Generalized muscle weakness    Gout    High cholesterol    History of falling    Hypertension    Hypothyroidism    Interstitial lung disease (HCC)    Meningioma of left sphenoid wing involving  cavernous sinus (HCC) 02/17/2012   Continue diplopia, left eye pain and left headaches.     Morbid obesity (HCC)    Neuromuscular disorder (HCC)    diabetic neuropathy    Normal coronary arteries    cardiac catheterization performed  10/31/14   Pneumonia    RA (rheumatoid arthritis) (HCC)    has been off methotreaxte since 10/13.   Spinal stenosis of lumbar region    Thyroid disease    Unspecified lack of coordination    URI (upper respiratory infection)    Past Surgical History:  Procedure Laterality Date   BRAIN SURGERY     Gamma knife 10/13. Needs repeat spring  '14   CARDIAC CATHETERIZATION N/A 10/31/2014   Procedure: Right/Left Heart Cath and Coronary Angiography;  Surgeon: Corky Crafts, MD;  Location: [facility] INVASIVE CV LAB;  Service: Cardiovascular;  Laterality: N/A;   COLONOSCOPY WITH PROPOFOL N/A 04/14/2022   Procedure: COLONOSCOPY WITH PROPOFOL;  Surgeon: Beverley Fiedler, MD;  Location: [facility] ENDOSCOPY;  Service: Gastroenterology;  Laterality: N/A;   CYST EXCISION     2 on face   CYST REMOVAL NECK     ESOPHAGOGASTRODUODENOSCOPY (EGD) WITH PROPOFOL N/A 09/14/2014   Procedure: ESOPHAGOGASTRODUODENOSCOPY (EGD) WITH PROPOFOL;  Surgeon: Louis Meckel, MD;  Location: [facility] ENDOSCOPY;  Service: Endoscopy;  Laterality: N/A;   INCISION AND DRAINAGE HIP Left 01/16/2017   Procedure: IRRIGATION AND DEBRIDEMENT LEFT HIP;  Surgeon: Kathryne Hitch, MD;  Location: [facility] ORS;  Service: Orthopedics;  Laterality: Left;   INTRAMEDULLARY (IM) NAIL INTERTROCHANTERIC Left 11/29/2016   Procedure: INTRAMEDULLARY (IM) NAIL INTERTROCHANTRIC;  Surgeon: Kathryne Hitch, MD;  Location: [facility] OR;  Service: Orthopedics;  Laterality: Left;   OVARY SURGERY     RIGHT HEART CATH N/A 08/12/2021   Procedure: RIGHT HEART CATH;  Surgeon: Runell Gess, MD;  Location: [facility] INVASIVE CV LAB;  Service: Cardiovascular;  Laterality: N/A;   SHOULDER SURGERY Left    TONSILLECTOMY  age 52   VAGINAL  HYSTERECTOMY     VIDEO BRONCHOSCOPY Bilateral 05/31/2013   Procedure: VIDEO BRONCHOSCOPY WITHOUT FLUORO;  Surgeon: Kalman Shan, MD;  Location: [facility] ENDOSCOPY;  Service: Cardiopulmonary;  Laterality: Bilateral;   video bronscoscopy  05/19/1998   lung   Social History   Tobacco Use   Smoking status: Never   Smokeless tobacco: Never  Vaping Use   Vaping status: Never Used  Substance Use Topics   Alcohol use: No   Drug use: No   Family History  Problem Relation Age of Onset   Diabetes Mother    Heart attack Mother    Hypertension Father    Lung cancer Father    Diabetes Sister    Breast cancer Sister    Breast cancer Sister    Diabetes Brother    Hypertension Brother    Heart disease Brother    Heart attack Brother    Kidney cancer Brother    Gout Brother    Kidney failure Brother        x 5   Esophageal cancer Brother    Stomach cancer Brother    Prostate cancer Brother    Uterine cancer Daughter        with mets   Fibroids Daughter    Hypertension Daughter    Hypertension Daughter    Hypertension Son    Heart murmur Son    Rheum arthritis Maternal Uncle    Alzheimer's disease Neg Hx    Dementia Neg Hx    Allergies  Allergen Reactions   Codeine Other (See Comments) and Swelling    Facial swelling  Chest pain and swelling in legs  Other Reaction(s): Other (See Comments)  Facial swelling, Chest pain and swelling in legs, , Facial swelling, Chest pain and swelling in legs, , Facial swelling, Chest pain and swelling in legs   Infliximab Anaphylaxis    (REMICADE) "sent me into shock"  "sent me into shock", "sent me into shock", "sent me into shock"   Lisinopril Swelling and Rash    Face and neck swelling  Face and neck swelling, Face and neck swelling, Face and neck swelling   Ofev [Nintedanib] Diarrhea      ROS Negative unless stated above    Objective:     BP 108/76 (BP Location: Right Arm, Patient Position: Sitting, Cuff Size: Large)   Pulse  90   Temp 97.9 F (36.6 C) (Oral)   Ht 5\' 4"  (1.626 m)   Wt 229 lb (103.9 kg)   SpO2 97% Comment: 3 liters  BMI 39.31 kg/m    Physical Exam Constitutional:      Appearance: Normal appearance.  HENT:     Head: Normocephalic and atraumatic.     Right Ear: Tympanic membrane, ear canal and external ear  normal.     Left Ear: Tympanic membrane, ear canal and external ear normal.     Nose: Nose normal.     Mouth/Throat:     Mouth: Mucous membranes are moist.     Pharynx: No oropharyngeal exudate or posterior oropharyngeal erythema.  Eyes:     General: No scleral icterus.    Extraocular Movements: Extraocular movements intact.     Conjunctiva/sclera: Conjunctivae normal.     Pupils: Pupils are equal, round, and reactive to light.  Neck:     Thyroid: No thyromegaly.  Cardiovascular:     Rate and Rhythm: Normal rate and regular rhythm.     Pulses: Normal pulses.     Heart sounds: Normal heart sounds. No murmur heard.    No friction rub.  Pulmonary:     Effort: Pulmonary effort is normal.     Breath sounds: Normal breath sounds. No wheezing, rhonchi or rales.  Abdominal:     General: Bowel sounds are normal.     Palpations: Abdomen is soft.     Tenderness: There is no abdominal tenderness.  Musculoskeletal:        General: No deformity. Normal range of motion.  Lymphadenopathy:     Cervical: No cervical adenopathy.  Skin:    General: Skin is warm and dry.     Findings: No lesion.  Neurological:     General: No focal deficit present.     Mental Status: She is alert and oriented to person, place, and time.     Cranial Nerves: Cranial nerves 2-12 are intact.     Comments: Gait not assessed as sitting in transport wheelchair.  Psychiatric:        Mood and Affect: Mood normal.        Thought Content: Thought content normal.     No results found for any visits on 11/28/22.    Assessment & Plan:  Chronic anticoagulation -Continue Eliquis for history of recurrent  DVT/PE. -     CBC with Differential/Platelet  Essential hypertension -Well-controlled -Continue current medications -     Metoprolol Succinate ER; Take 25 mg (1 tab) along with a 100 mg tab for a total daily dose of 125 mg.  Dispense: 90 tablet; Refill: 3 -     Olmesartan Medoxomil; Take 1 tablet (40 mg total) by mouth daily.  Dispense: 90 tablet; Refill: 3 -     Comprehensive metabolic panel -     CBC with Differential/Platelet -     TSH  Chronic diastolic CHF (congestive heart failure) (HCC) -Euvolemic, stable -Continue follow-up with cardiology -Continue current medications -     Potassium Chloride ER; Take 1 tablet (20 mEq total) by mouth daily.  Dispense: 90 tablet; Refill: 3  ILD (interstitial lung disease) (HCC) -Stable -Continue 3 L O2 via [place] -Continue current medications -Continue follow-up with pulmonology locally and at [facility].  Acquired hypothyroidism -     Levothyroxine Sodium; Take 1 tablet (50 mcg total) by mouth daily.  Dispense: 90 tablet; Refill: 3 -     TSH  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) -Controlled -Hgb A1C 5.8% on 09/03/2022 -Continue lifestyle modifications -     Microalbumin / creatinine urine ratio  Rheumatoid arthritis involving both hands with positive rheumatoid factor (HCC) -Continue follow-up with rheumatology -Continue Enbrel -     CBC with Differential/Platelet  CRI (chronic renal insufficiency), stage 3 (moderate) (HCC) -Continue follow-up with nephrology -     Comprehensive metabolic panel  Chronic bilateral low back pain without sciatica -continue f/u with pain management -Discussed alternative options to treat pain.  Given contact information for local clinics.  Primary osteoarthritis of right shoulder  Seasonal allergies -     Fluticasone Propionate; Place 1 spray into both nostrils daily.  Dispense: 16 g; Refill: 3   On day of service, 43 minutes spent caring for this patient face-to-face, reviewing the  chart, counseling and/or coordinating care for plan and treatment of diagnosis below.    Return in about 3 months (around 02/28/2023).   [name], MD

## 2022-12-03 ENCOUNTER — Other Ambulatory Visit: Payer: Medicare Other

## 2022-12-03 NOTE — Progress Notes (Signed)
12/03/2022 Name: [name] MRN: [id number] DOB: Jan 19, 1941  Chief Complaint  Patient presents with   Medication Management    [name] is a 82 y.o. year old female who presented for a telephone visit to transfer care to [facility] Pharm[facility]  Subjective:  Care Team: Primary Care Provider: [name], MD ; Next Scheduled Visit: 03/02/23  Medication Access/Adherence Current Pharmacy:  [facility] - [place], [place] - [address] [facility] [address] [place] [address] Phone: [phone] Fax: [phone]  [id number] - [place], [place] - [address] AT [facility] [address] [facility] [address] [place] [address] Phone: [phone] Fax: [phone]  [id number] - [place], [place] - [address] AT [facility] OF [address] [place] [address] Phone: [phone] Fax: [phone]  -Patient reports affordability concerns with their medications: No  -Patient reports access/transportation concerns to their pharmacy: No  -Patient reports adherence concerns with their medications:  No    Diabetes: Current medications: Ozempic 0.5mg  weekly -Current glucose readings: FBG 110-120 -Patient denies hypoglycemic s/sx including dizziness, shakiness, sweating.  -Patient denies hyperglycemic symptoms including polyuria, polydipsia, polyphagia, nocturia, neuropathy, blurred vision.  Hypertension: Current medications: amlodipine 10mg  daily, furosemide 80mg  daily, hydralazine 25mg  BID, metoprolol xl 125mg  daily, olmesartan 40mg  daily, metolazone 2.5mg  if needed  -Patient states she has not needed a dose of metolazone in a while -Current blood pressure readings readings: 120/70 -Patient denies hypotensive s/sx including dizziness, lightheadedness.  -Patient denies hypertensive symptoms including headache, chest pain, shortness of breath  Hyperlipidemia/ASCVD  Risk Reduction Current lipid lowering medications: atorvastatin 10mg  daily  Objective: Lab Results  Component Value Date   HGBA1C 5.8 09/03/2022   Lab Results  Component Value Date   CREATININE 0.98 11/28/2022   BUN 19 11/28/2022   NA 141 11/28/2022   K 3.7 11/28/2022   CL 99 11/28/2022   CO2 33 (H) 11/28/2022   Lab Results  Component Value Date   CHOL 146 09/29/2022   HDL 42 09/29/2022   LDLCALC 76 09/29/2022   LDLDIRECT 59.0 09/26/2020   TRIG 162 (H) 09/29/2022   CHOLHDL 3.5 09/29/2022   Medications Reviewed Today     Reviewed by Lenna Gilford, RPH (Pharmacist) on 12/03/22 at [phone]  Med List Status: <None>   Medication Order Taking? Sig Documenting Provider Last Dose Status Informant  acetaminophen (TYLENOL) 500 MG tablet [id number] Yes Take 500-1,000 mg by mouth every 6 (six) hours as needed for moderate pain. [provider] Taking Active Self           Med Note Tiburcio Pea, Joyce Copa Aug 08, 2021  4:43 PM)    albuterol (PROVENTIL) (2.5 MG/3ML) 0.083% nebulizer solution [id number] Yes Take 3 mLs (2.5 mg total) by nebulization every 6 (six) hours as needed for wheezing. Medina-Vargas, Monina C, NP Taking Active Self  albuterol (VENTOLIN HFA) 108 (90 Base) MCG/ACT inhaler [id number] Yes Inhale 2 puffs into the lungs every 4 (four) hours as needed for wheezing or shortness of breath. [provider] Taking Active Self  allopurinol (ZYLOPRIM) 300 MG tablet [id number] Yes Take 1 tablet (300 mg total) by mouth daily. Medina-Vargas, Monina C, NP Taking Active Self  amLODipine (NORVASC) 10 MG tablet [id number] Yes Take 10 mg by mouth daily. [provider] Taking Active Self  apixaban (ELIQUIS) 5 MG TABS tablet [id number] Yes Take 1 tablet (5  mg total) by mouth 2 (two) times daily. Runell Gess, MD Taking Active   Artificial Tear Ointment (DRY EYES OP) [id number] Yes Place 1 drop into both eyes 2 (two) times daily as needed (dry eyes). [provider] Taking Active Self  atorvastatin (LIPITOR) 10 MG tablet [id number] Yes Take 1 tablet (10 mg total) by mouth daily. [name], MD Taking Active   azithromycin [facility]) 250 MG tablet [id number] Yes Take 250 mg by mouth 3 (three) times a week. [provider] Taking Active Self  Calcium Carb-Cholecalciferol (CALCIUM + D3 PO) [id number] Yes Take 2 tablets by mouth daily with lunch.  [provider] Taking Active Self  Cholecalciferol (VITAMIN D-3) 25 MCG (1000 UT) CAPS [id number] Yes Take 2 capsules (2,000 Units total) by mouth daily. GIVE ONE TAB PO Q DAY Medina-Vargas, Monina C, NP Taking Active Self  colchicine 0.6 MG tablet [id number] Yes Take 1 tablet (0.6 mg total) by mouth daily as needed (gout flares). Medina-Vargas, Monina C, NP Taking Active Self  cyclobenzaprine (FLEXERIL) 5 MG tablet [id number] Yes Take 1 tablet (5 mg total) by mouth at bedtime as needed for muscle spasms. Jones Bales, NP Taking Active   diclofenac Sodium (VOLTAREN) 1 % GEL [id number] Yes Apply 2 g topically 3 (three) times daily. Apply to both hands and knees  Patient taking differently: Apply 2 g topically 3 (three) times daily as needed (pain). Apply to both hands and knees   Medina-Vargas, Monina C, NP Taking Active Self  donepezil (ARICEPT) 10 MG tablet [id number] Yes Take 1 tablet (10 mg total) by mouth at bedtime. Anson Fret, MD Taking Active   DULoxetine (CYMBALTA) 60 MG capsule [id number] Yes Take 1 capsule (60 mg total) by mouth daily. Jones Bales, NP Taking Active   etanercept (ENBREL SURECLICK) 50 MG/ML injection [id number] Yes Inject 50 mg into the skin once a week. [provider] Taking Active Self  fluticasone (FLONASE) 50 MCG/ACT nasal spray [id number] Yes Place 1 spray into both nostrils daily. [name], MD Taking Active   furosemide (LASIX) 80 MG tablet [id number] Yes TAKE 1 TABLET(80 MG) BY MOUTH DAILY Runell Gess, MD Taking Active    gabapentin (NEURONTIN) 600 MG tablet [id number] Yes Take 2 tablets (1,200 mg total) by mouth 3 (three) times daily. Jones Bales, NP Taking Active   guaiFENesin (MUCINEX) 600 MG 12 hr tablet [id number] Yes Take 2 tablets (1,200 mg total) by mouth 2 (two) times daily. Medina-Vargas, Monina C, NP Taking Active Self  hydrALAZINE (APRESOLINE) 25 MG tablet [id number] Yes TAKE 1 TABLET(25 MG) BY MOUTH TWICE DAILY Runell Gess, MD Taking Active   hydroxychloroquine (PLAQUENIL) 200 MG tablet [id number] Yes Take 1 tablet (200 mg total) by mouth daily. Medina-Vargas, Monina C, NP Taking Active Self  leflunomide (ARAVA) 20 MG tablet [id number] Yes Take 1 tablet (20 mg total) by mouth daily. Medina-Vargas, Monina C, NP Taking Active Self  levothyroxine (SYNTHROID) 50 MCG tablet [id number] Yes Take 1 tablet (50 mcg total) by mouth daily. [name], MD Taking Active   linaclotide Karlene Einstein) 72 MCG capsule [id number] Yes Take 1 capsule (72 mcg total) by mouth daily before breakfast. [name], MD Taking Active   metolazone (ZAROXOLYN) 2.5 MG tablet [id number] Yes TAKE 1 TABLET BY MOUTH DAILY AS NEEDED FOR 2 DAYS FOR WEIGHT ABOVE 255 POUNDS Medina-Vargas, Monina C, NP Taking Active Self           Med Note (Florine Sprenkle,  Manasa Spease A   Wed Dec 03, 2022  9:14 AM) Has if needed, but has not needed to use recently  metoprolol succinate (TOPROL-XL) 100 MG 24 hr tablet [id number] Yes TAKE 1 TABLET BY MOUTH DAILY ALONG WITH 25 MG TABLET Runell Gess, MD Taking Active   metoprolol succinate (TOPROL-XL) 25 MG 24 hr tablet [id number] Yes Take 25 mg (1 tab) along with a 100 mg tab for a total daily dose of 125 mg. [name], MD Taking Active   morphine (MS CONTIN) 15 MG 12 hr tablet [id number] Yes Take 1 tablet (15 mg total) by mouth every 12 (twelve) hours. Jones Bales, NP Taking Active   morphine (MSIR) 15 MG tablet [id number] Yes Take 1 tablet (15 mg total) by mouth 2 (two) times daily. Jones Bales, NP  Taking Active   Multiple Vitamin (MULTIVITAMIN WITH MINERALS) TABS [id number] Yes Take 1 tablet by mouth daily.  [provider] Taking Active Self  olmesartan (BENICAR) 40 MG tablet [id number] Yes Take 1 tablet (40 mg total) by mouth daily. [name], MD Taking Active   ondansetron (ZOFRAN-ODT) 4 MG disintegrating tablet [id number] Yes Take 1 tablet (4 mg total) by mouth every 8 (eight) hours as needed for nausea or vomiting. Napoleon Form, MD Taking Active   OXYGEN [id number] Yes Inhale 3 L into the lungs See admin instructions. 3 L at bedtime, and 3 L during the day as needed for exertion [provider] Taking Active Self  pantoprazole (PROTONIX) 40 MG tablet [id number] Yes Take 1 tablet (40 mg total) by mouth 2 (two) times daily. take 1 tablet by mouth once daily MINUTES BEFORE 1ST MEAL OF THE DAY Napoleon Form, MD Taking Active   Potassium Chloride ER 20 MEQ TBCR [id number] Yes Take 1 tablet (20 mEq total) by mouth daily. [name], MD Taking Active   predniSONE (DELTASONE) 10 MG tablet [id number] Yes Take 1 tablet (10 mg total) by mouth daily with breakfast. [name], MD Taking Active Self  Semaglutide,0.25 or 0.5MG /DOS, (OZEMPIC, 0.25 OR 0.5 MG/DOSE,) 2 MG/1.5ML SOPN [id number] Yes Inject 0.5 mg into the skin once a week. [name], MD Taking Active   Med List Note Jones Bales, NP 06/02/18 1129): [facility] [place]           Assessment/Plan:   Diabetes: - Currently controlled - Reviewed long term cardiovascular and renal outcomes of uncontrolled blood sugar - Reviewed goal A1c, goal fasting, and goal 2 hour post prandial glucose - Recommend to continue current medication regimen at this time  - Recommend to check glucose daily and record  Hypertension: - Currently controlled - Reviewed long term cardiovascular and renal outcomes of uncontrolled blood pressure - Reviewed appropriate blood pressure monitoring technique and  reviewed goal blood pressure.                      - Recommended to check home blood pressure and heart rate daily and record - Recommend to continue current medication regimen at this time   Hyperlipidemia/ASCVD Risk Reduction: - Currently controlled.  - Reviewed long term complications of uncontrolled cholesterol - Recommend to continue current medication regimen at this time   Follow Up Plan: Telephone visit in 3 months  Lenna Gilford, PharmD, DPLA

## 2022-12-08 ENCOUNTER — Other Ambulatory Visit: Payer: Self-pay | Admitting: Family Medicine

## 2022-12-08 DIAGNOSIS — I10 Essential (primary) hypertension: Secondary | ICD-10-CM

## 2022-12-10 ENCOUNTER — Other Ambulatory Visit: Payer: Self-pay

## 2022-12-10 DIAGNOSIS — I10 Essential (primary) hypertension: Secondary | ICD-10-CM

## 2022-12-10 MED ORDER — METOPROLOL SUCCINATE ER 100 MG PO TB24
ORAL_TABLET | ORAL | 3 refills | Status: DC
Start: 2022-12-10 — End: 2023-03-04

## 2022-12-10 NOTE — Telephone Encounter (Signed)
RX for Toprol written by pt's Cardiologist.  Taking a total dose of 125 mg daily.   Toprol 100 mg was sent to local pharmacy by Cardiologist several months ago.  Pt mentioned she was out of the Toprol 25 mg tabs and requested a refill at recent [facility].  Can send request to Cardiology to prevent confusion/delay in refill.

## 2022-12-15 ENCOUNTER — Other Ambulatory Visit: Payer: Self-pay

## 2022-12-15 ENCOUNTER — Encounter (HOSPITAL_BASED_OUTPATIENT_CLINIC_OR_DEPARTMENT_OTHER): Payer: Self-pay

## 2022-12-15 ENCOUNTER — Telehealth: Payer: Self-pay | Admitting: Cardiovascular Disease

## 2022-12-15 ENCOUNTER — Emergency Department (HOSPITAL_BASED_OUTPATIENT_CLINIC_OR_DEPARTMENT_OTHER): Payer: Medicare Other

## 2022-12-15 ENCOUNTER — Inpatient Hospital Stay (HOSPITAL_BASED_OUTPATIENT_CLINIC_OR_DEPARTMENT_OTHER)
Admission: EM | Admit: 2022-12-15 | Discharge: 2022-12-19 | DRG: 378 | Disposition: A | Discharge status: Home Health | Payer: Medicare Other | Attending: Internal Medicine | Admitting: Internal Medicine

## 2022-12-15 DIAGNOSIS — K5730 Diverticulosis of large intestine without perforation or abscess without bleeding: Secondary | ICD-10-CM | POA: Diagnosis not present

## 2022-12-15 DIAGNOSIS — Z7901 Long term (current) use of anticoagulants: Secondary | ICD-10-CM

## 2022-12-15 DIAGNOSIS — D62 Acute posthemorrhagic anemia: Secondary | ICD-10-CM | POA: Diagnosis present

## 2022-12-15 DIAGNOSIS — Z6841 Body Mass Index (BMI) 40.0 and over, adult: Secondary | ICD-10-CM | POA: Diagnosis not present

## 2022-12-15 DIAGNOSIS — N1831 Chronic kidney disease, stage 3a: Secondary | ICD-10-CM | POA: Diagnosis present

## 2022-12-15 DIAGNOSIS — I10 Essential (primary) hypertension: Secondary | ICD-10-CM | POA: Diagnosis not present

## 2022-12-15 DIAGNOSIS — M48061 Spinal stenosis, lumbar region without neurogenic claudication: Secondary | ICD-10-CM | POA: Diagnosis present

## 2022-12-15 DIAGNOSIS — I130 Hypertensive heart and chronic kidney disease with heart failure and stage 1 through stage 4 chronic kidney disease, or unspecified chronic kidney disease: Secondary | ICD-10-CM | POA: Diagnosis present

## 2022-12-15 DIAGNOSIS — Z79899 Other long term (current) drug therapy: Secondary | ICD-10-CM

## 2022-12-15 DIAGNOSIS — Z86718 Personal history of other venous thrombosis and embolism: Secondary | ICD-10-CM

## 2022-12-15 DIAGNOSIS — M069 Rheumatoid arthritis, unspecified: Secondary | ICD-10-CM | POA: Diagnosis present

## 2022-12-15 DIAGNOSIS — E7800 Pure hypercholesterolemia, unspecified: Secondary | ICD-10-CM | POA: Diagnosis present

## 2022-12-15 DIAGNOSIS — M5126 Other intervertebral disc displacement, lumbar region: Secondary | ICD-10-CM | POA: Diagnosis not present

## 2022-12-15 DIAGNOSIS — G894 Chronic pain syndrome: Secondary | ICD-10-CM | POA: Diagnosis present

## 2022-12-15 DIAGNOSIS — Z7985 Long-term (current) use of injectable non-insulin antidiabetic drugs: Secondary | ICD-10-CM

## 2022-12-15 DIAGNOSIS — Z800 Family history of malignant neoplasm of digestive organs: Secondary | ICD-10-CM

## 2022-12-15 DIAGNOSIS — M419 Scoliosis, unspecified: Secondary | ICD-10-CM | POA: Diagnosis present

## 2022-12-15 DIAGNOSIS — M5136 Other intervertebral disc degeneration, lumbar region: Secondary | ICD-10-CM | POA: Diagnosis not present

## 2022-12-15 DIAGNOSIS — K625 Hemorrhage of anus and rectum: Principal | ICD-10-CM | POA: Diagnosis present

## 2022-12-15 DIAGNOSIS — Z9981 Dependence on supplemental oxygen: Secondary | ICD-10-CM | POA: Diagnosis not present

## 2022-12-15 DIAGNOSIS — R109 Unspecified abdominal pain: Secondary | ICD-10-CM | POA: Diagnosis not present

## 2022-12-15 DIAGNOSIS — Z86711 Personal history of pulmonary embolism: Secondary | ICD-10-CM | POA: Diagnosis not present

## 2022-12-15 DIAGNOSIS — Z8042 Family history of malignant neoplasm of prostate: Secondary | ICD-10-CM

## 2022-12-15 DIAGNOSIS — I272 Pulmonary hypertension, unspecified: Secondary | ICD-10-CM | POA: Diagnosis present

## 2022-12-15 DIAGNOSIS — E1140 Type 2 diabetes mellitus with diabetic neuropathy, unspecified: Secondary | ICD-10-CM | POA: Diagnosis present

## 2022-12-15 DIAGNOSIS — J4489 Other specified chronic obstructive pulmonary disease: Secondary | ICD-10-CM | POA: Diagnosis present

## 2022-12-15 DIAGNOSIS — J9611 Chronic respiratory failure with hypoxia: Secondary | ICD-10-CM | POA: Diagnosis present

## 2022-12-15 DIAGNOSIS — M4726 Other spondylosis with radiculopathy, lumbar region: Secondary | ICD-10-CM | POA: Diagnosis present

## 2022-12-15 DIAGNOSIS — Z79890 Hormone replacement therapy (postmenopausal): Secondary | ICD-10-CM | POA: Diagnosis not present

## 2022-12-15 DIAGNOSIS — K5731 Diverticulosis of large intestine without perforation or abscess with bleeding: Secondary | ICD-10-CM | POA: Diagnosis present

## 2022-12-15 DIAGNOSIS — N183 Chronic kidney disease, stage 3 unspecified: Secondary | ICD-10-CM | POA: Diagnosis present

## 2022-12-15 DIAGNOSIS — M5431 Sciatica, right side: Secondary | ICD-10-CM | POA: Diagnosis present

## 2022-12-15 DIAGNOSIS — M79661 Pain in right lower leg: Secondary | ICD-10-CM | POA: Diagnosis not present

## 2022-12-15 DIAGNOSIS — J849 Interstitial pulmonary disease, unspecified: Secondary | ICD-10-CM | POA: Diagnosis not present

## 2022-12-15 DIAGNOSIS — J8410 Pulmonary fibrosis, unspecified: Secondary | ICD-10-CM | POA: Diagnosis present

## 2022-12-15 DIAGNOSIS — M160 Bilateral primary osteoarthritis of hip: Secondary | ICD-10-CM | POA: Diagnosis not present

## 2022-12-15 DIAGNOSIS — Z90710 Acquired absence of both cervix and uterus: Secondary | ICD-10-CM

## 2022-12-15 DIAGNOSIS — Z7952 Long term (current) use of systemic steroids: Secondary | ICD-10-CM

## 2022-12-15 DIAGNOSIS — I5032 Chronic diastolic (congestive) heart failure: Secondary | ICD-10-CM | POA: Diagnosis present

## 2022-12-15 DIAGNOSIS — Z86011 Personal history of benign neoplasm of the brain: Secondary | ICD-10-CM

## 2022-12-15 DIAGNOSIS — M47816 Spondylosis without myelopathy or radiculopathy, lumbar region: Secondary | ICD-10-CM | POA: Diagnosis not present

## 2022-12-15 DIAGNOSIS — I2699 Other pulmonary embolism without acute cor pulmonale: Secondary | ICD-10-CM | POA: Diagnosis not present

## 2022-12-15 DIAGNOSIS — M5135 Other intervertebral disc degeneration, thoracolumbar region: Secondary | ICD-10-CM | POA: Diagnosis not present

## 2022-12-15 DIAGNOSIS — K922 Gastrointestinal hemorrhage, unspecified: Secondary | ICD-10-CM | POA: Diagnosis present

## 2022-12-15 DIAGNOSIS — K632 Fistula of intestine: Secondary | ICD-10-CM | POA: Diagnosis present

## 2022-12-15 DIAGNOSIS — Z8051 Family history of malignant neoplasm of kidney: Secondary | ICD-10-CM

## 2022-12-15 DIAGNOSIS — I120 Hypertensive chronic kidney disease with stage 5 chronic kidney disease or end stage renal disease: Secondary | ICD-10-CM | POA: Diagnosis not present

## 2022-12-15 DIAGNOSIS — M109 Gout, unspecified: Secondary | ICD-10-CM | POA: Diagnosis present

## 2022-12-15 DIAGNOSIS — N281 Cyst of kidney, acquired: Secondary | ICD-10-CM | POA: Diagnosis not present

## 2022-12-15 DIAGNOSIS — E1129 Type 2 diabetes mellitus with other diabetic kidney complication: Secondary | ICD-10-CM | POA: Diagnosis not present

## 2022-12-15 DIAGNOSIS — M545 Low back pain, unspecified: Secondary | ICD-10-CM | POA: Diagnosis not present

## 2022-12-15 DIAGNOSIS — Z888 Allergy status to other drugs, medicaments and biological substances status: Secondary | ICD-10-CM

## 2022-12-15 DIAGNOSIS — E1122 Type 2 diabetes mellitus with diabetic chronic kidney disease: Secondary | ICD-10-CM | POA: Diagnosis present

## 2022-12-15 DIAGNOSIS — Z8249 Family history of ischemic heart disease and other diseases of the circulatory system: Secondary | ICD-10-CM

## 2022-12-15 DIAGNOSIS — Z801 Family history of malignant neoplasm of trachea, bronchus and lung: Secondary | ICD-10-CM

## 2022-12-15 DIAGNOSIS — Z885 Allergy status to narcotic agent status: Secondary | ICD-10-CM

## 2022-12-15 DIAGNOSIS — M79604 Pain in right leg: Secondary | ICD-10-CM | POA: Diagnosis not present

## 2022-12-15 DIAGNOSIS — Z841 Family history of disorders of kidney and ureter: Secondary | ICD-10-CM

## 2022-12-15 DIAGNOSIS — Z833 Family history of diabetes mellitus: Secondary | ICD-10-CM

## 2022-12-15 DIAGNOSIS — J479 Bronchiectasis, uncomplicated: Secondary | ICD-10-CM | POA: Diagnosis present

## 2022-12-15 DIAGNOSIS — Z8049 Family history of malignant neoplasm of other genital organs: Secondary | ICD-10-CM

## 2022-12-15 DIAGNOSIS — E039 Hypothyroidism, unspecified: Secondary | ICD-10-CM | POA: Diagnosis present

## 2022-12-15 DIAGNOSIS — J9610 Chronic respiratory failure, unspecified whether with hypoxia or hypercapnia: Secondary | ICD-10-CM | POA: Diagnosis not present

## 2022-12-15 DIAGNOSIS — Z794 Long term (current) use of insulin: Secondary | ICD-10-CM

## 2022-12-15 DIAGNOSIS — Z803 Family history of malignant neoplasm of breast: Secondary | ICD-10-CM

## 2022-12-15 DIAGNOSIS — Z79891 Long term (current) use of opiate analgesic: Secondary | ICD-10-CM

## 2022-12-15 LAB — LIPASE, BLOOD: Lipase: 48 U/L (ref 11–51)

## 2022-12-15 LAB — CBC
HCT: 34.7 % — ABNORMAL LOW (ref 36.0–46.0)
Hemoglobin: 10.5 g/dL — ABNORMAL LOW (ref 12.0–15.0)
MCH: 30.3 pg (ref 26.0–34.0)
MCHC: 30.3 g/dL (ref 30.0–36.0)
MCV: 100 fL (ref 80.0–100.0)
Platelets: 156 10*3/uL (ref 150–400)
RBC: 3.47 MIL/uL — ABNORMAL LOW (ref 3.87–5.11)
RDW: 15.1 % (ref 11.5–15.5)
WBC: 9.4 10*3/uL (ref 4.0–10.5)
nRBC: 0 % (ref 0.0–0.2)

## 2022-12-15 LAB — URINALYSIS, ROUTINE W REFLEX MICROSCOPIC
Bilirubin Urine: NEGATIVE
Glucose, UA: NEGATIVE mg/dL
Ketones, ur: NEGATIVE mg/dL
Leukocytes,Ua: NEGATIVE
Nitrite: POSITIVE — AB
Protein, ur: NEGATIVE mg/dL
Specific Gravity, Urine: 1.014 (ref 1.005–1.030)
pH: 5.5 (ref 5.0–8.0)

## 2022-12-15 LAB — COMPREHENSIVE METABOLIC PANEL
ALT: 15 U/L (ref 0–44)
AST: 12 U/L — ABNORMAL LOW (ref 15–41)
Albumin: 4 g/dL (ref 3.5–5.0)
Alkaline Phosphatase: 64 U/L (ref 38–126)
Anion gap: 8 (ref 5–15)
BUN: 18 mg/dL (ref 8–23)
CO2: 34 mmol/L — ABNORMAL HIGH (ref 22–32)
Calcium: 8.6 mg/dL — ABNORMAL LOW (ref 8.9–10.3)
Chloride: 101 mmol/L (ref 98–111)
Creatinine, Ser: 0.85 mg/dL (ref 0.44–1.00)
GFR, Estimated: >60 mL/min (ref >60)
Glucose, Bld: 170 mg/dL — ABNORMAL HIGH (ref 70–99)
Potassium: 3.9 mmol/L (ref 3.5–5.1)
Sodium: 143 mmol/L (ref 135–145)
Total Bilirubin: 0.4 mg/dL (ref 0.3–1.2)
Total Protein: 6.3 g/dL — ABNORMAL LOW (ref 6.5–8.1)

## 2022-12-15 LAB — OCCULT BLOOD X 1 CARD TO LAB, STOOL: Fecal Occult Bld: POSITIVE — AB

## 2022-12-15 LAB — CBG MONITORING, ED: Glucose-Capillary: 132 mg/dL — ABNORMAL HIGH (ref 70–99)

## 2022-12-15 MED ORDER — INSULIN ASPART 100 UNIT/ML IJ SOLN: 0.0000 [IU] | Freq: Every day | INTRAMUSCULAR | Status: DC

## 2022-12-15 MED ORDER — INSULIN ASPART 100 UNIT/ML IJ SOLN
0.0000 [IU] | Freq: Three times a day (TID) | INTRAMUSCULAR | Status: DC
  Administered 2022-12-16: 1 [IU] via SUBCUTANEOUS
  Administered 2022-12-16 – 2022-12-18 (×4): 2 [IU] via SUBCUTANEOUS
  Administered 2022-12-19: 1 [IU] via SUBCUTANEOUS

## 2022-12-15 MED ORDER — IOHEXOL 300 MG/ML  SOLN
100.0000 mL | Freq: Once | INTRAMUSCULAR | Status: AC | PRN
  Administered 2022-12-15: 90 mL via INTRAVENOUS

## 2022-12-15 NOTE — ED Provider Notes (Signed)
[place] EMERGENCY DEPARTMENT AT [facility] Provider Note   CSN: [id number] Arrival date & time: 12/15/22  1305     History  No chief complaint on file.   [name] is a 82 y.o. female.  Patient complains of bright red rectal bleeding that started yesterday.  Patient is taking Eliquis twice a day due to a history of 3 previous pulmonary emboli.     Patient reports she did not take her Eliquis today.  Patient complains of upper abdominal discomfort.  Patient reports she began having abdominal cramping yesterday.  Patient has a history of a colon stricture.  Patient reports that she eats a soft diet due to the stricture.  Patient has a past medical history of hypertension, diabetes, interstitial lung disease, congestive heart failure, diverticulosis chronic kidney disease, hypothyroidism and pulmonary embolism  The history is provided by the patient. No language interpreter was used.       Home Medications Prior to Admission medications   Medication Sig Start Date End Date Taking? Authorizing Provider  acetaminophen (TYLENOL) 500 MG tablet Take 500-1,000 mg by mouth every 6 (six) hours as needed for moderate pain.    [provider]  albuterol (PROVENTIL) (2.5 MG/3ML) 0.083% nebulizer solution Take 3 mLs (2.5 mg total) by nebulization every 6 (six) hours as needed for wheezing. 05/03/21   Medina-Vargas, Monina C, NP  albuterol (VENTOLIN HFA) 108 (90 Base) MCG/ACT inhaler Inhale 2 puffs into the lungs every 4 (four) hours as needed for wheezing or shortness of breath.    [provider]  allopurinol (ZYLOPRIM) 300 MG tablet Take 1 tablet (300 mg total) by mouth daily. 05/03/21   Medina-Vargas, Monina C, NP  amLODipine (NORVASC) 10 MG tablet Take 10 mg by mouth daily. 02/12/22   [provider]  apixaban (ELIQUIS) 5 MG TABS tablet Take 1 tablet (5 mg total) by mouth 2 (two) times daily. 09/02/22   Runell Gess, MD  Artificial Tear Ointment  (DRY EYES OP) Place 1 drop into both eyes 2 (two) times daily as needed (dry eyes).    [provider]  atorvastatin (LIPITOR) 10 MG tablet Take 1 tablet (10 mg total) by mouth daily. 08/14/22   [name], MD  azithromycin (ZITHROMAX) 250 MG tablet Take 250 mg by mouth 3 (three) times a week. 11/23/21   [provider]  Calcium Carb-Cholecalciferol (CALCIUM + D3 PO) Take 2 tablets by mouth daily with lunch.     [provider]  Cholecalciferol (VITAMIN D-3) 25 MCG (1000 UT) CAPS Take 2 capsules (2,000 Units total) by mouth daily. GIVE ONE TAB PO Q DAY 05/03/21   Medina-Vargas, Monina C, NP  colchicine 0.6 MG tablet Take 1 tablet (0.6 mg total) by mouth daily as needed (gout flares). 05/03/21   Medina-Vargas, Monina C, NP  cyclobenzaprine (FLEXERIL) 5 MG tablet Take 1 tablet (5 mg total) by mouth at bedtime as needed for muscle spasms. 11/27/22   Jones Bales, NP  diclofenac Sodium (VOLTAREN) 1 % GEL Apply 2 g topically 3 (three) times daily. Apply to both hands and knees Patient taking differently: Apply 2 g topically 3 (three) times daily as needed (pain). Apply to both hands and knees 05/03/21   Medina-Vargas, Monina C, NP  donepezil (ARICEPT) 10 MG tablet Take 1 tablet (10 mg total) by mouth at bedtime. 09/08/22   Anson Fret, MD  DULoxetine (CYMBALTA) 60 MG capsule Take 1 capsule (60 mg total) by mouth daily.  11/27/22   Jones Bales, NP  etanercept (ENBREL SURECLICK) 50 MG/ML injection Inject 50 mg into the skin once a week.    [provider]  fluticasone (FLONASE) 50 MCG/ACT nasal spray Place 1 spray into both nostrils daily. 11/28/22   [name], MD  furosemide (LASIX) 80 MG tablet TAKE 1 TABLET(80 MG) BY MOUTH DAILY 08/14/22   Runell Gess, MD  gabapentin (NEURONTIN) 600 MG tablet Take 2 tablets (1,200 mg total) by mouth 3 (three) times daily. 11/27/22   Jones Bales, NP  guaiFENesin (MUCINEX) 600 MG 12 hr tablet Take 2 tablets  (1,200 mg total) by mouth 2 (two) times daily. 05/03/21   Medina-Vargas, Monina C, NP  hydrALAZINE (APRESOLINE) 25 MG tablet TAKE 1 TABLET(25 MG) BY MOUTH TWICE DAILY 08/21/22   Runell Gess, MD  hydroxychloroquine (PLAQUENIL) 200 MG tablet Take 1 tablet (200 mg total) by mouth daily. 05/03/21   Medina-Vargas, Monina C, NP  leflunomide (ARAVA) 20 MG tablet Take 1 tablet (20 mg total) by mouth daily. 05/03/21 08/09/23  Medina-Vargas, Monina C, NP  levothyroxine (SYNTHROID) 50 MCG tablet Take 1 tablet (50 mcg total) by mouth daily. 11/28/22   [name], MD  linaclotide [facility]) 72 MCG capsule Take 1 capsule (72 mcg total) by mouth daily before breakfast. 08/14/22   [name], MD  metolazone (ZAROXOLYN) 2.5 MG tablet TAKE 1 TABLET BY MOUTH DAILY AS NEEDED FOR 2 DAYS FOR WEIGHT ABOVE 255 POUNDS 05/03/21   Medina-Vargas, Monina C, NP  metoprolol succinate (TOPROL-XL) 100 MG 24 hr tablet TAKE 1 TABLET BY MOUTH DAILY ALONG WITH 25 MG TABLET 12/10/22   Runell Gess, MD  metoprolol succinate (TOPROL-XL) 25 MG 24 hr tablet Take 25 mg (1 tab) along with a 100 mg tab for a total daily dose of 125 mg. 11/28/22   [name], MD  morphine (MS CONTIN) 15 MG 12 hr tablet Take 1 tablet (15 mg total) by mouth every 12 (twelve) hours. 11/27/22   Jones Bales, NP  morphine (MSIR) 15 MG tablet Take 1 tablet (15 mg total) by mouth 2 (two) times daily. 11/27/22   Jones Bales, NP  Multiple Vitamin (MULTIVITAMIN WITH MINERALS) TABS Take 1 tablet by mouth daily.     [provider]  olmesartan (BENICAR) 40 MG tablet Take 1 tablet (40 mg total) by mouth daily. 11/28/22   [name], MD  ondansetron (ZOFRAN-ODT) 4 MG disintegrating tablet Take 1 tablet (4 mg total) by mouth every 8 (eight) hours as needed for nausea or vomiting. 06/26/22   Napoleon Form, MD  OXYGEN Inhale 3 L into the lungs See admin instructions. 3 L at bedtime, and 3 L during the day as needed for exertion     [provider]  pantoprazole (PROTONIX) 40 MG tablet Take 1 tablet (40 mg total) by mouth 2 (two) times daily. take 1 tablet by mouth once daily MINUTES BEFORE 1ST MEAL OF THE DAY 06/26/22   Napoleon Form, MD  Potassium Chloride ER 20 MEQ TBCR Take 1 tablet (20 mEq total) by mouth daily. 11/28/22   [name], MD  predniSONE (DELTASONE) 10 MG tablet Take 1 tablet (10 mg total) by mouth daily with breakfast. 05/22/21   [name], MD  Semaglutide,0.25 or 0.5MG /DOS, (OZEMPIC, 0.25 OR 0.5 MG/DOSE,) 2 MG/1.5ML SOPN Inject 0.5 mg into the skin once a week. 08/19/22   [name], MD  Allergies    Codeine, Infliximab, Lisinopril, and Ofev [nintedanib]    Review of Systems   Review of Systems  Gastrointestinal:  Positive for abdominal pain and blood in stool.  All other systems reviewed and are negative.   Physical Exam Updated Vital Signs BP 123/84   Pulse 88   Temp 98.5 F (36.9 C) (Oral)   Resp 18   Ht 5\' 4"  (1.626 m)   Wt 103.9 kg   SpO2 94%   BMI 39.32 kg/m  Physical Exam Vitals reviewed.  HENT:     Mouth/Throat:     Mouth: Mucous membranes are moist.  Eyes:     Pupils: Pupils are equal, round, and reactive to light.  Cardiovascular:     Rate and Rhythm: Normal rate.  Pulmonary:     Effort: Pulmonary effort is normal.  Abdominal:     General: Abdomen is flat.  Genitourinary:    Rectum: Guaiac result positive.  Musculoskeletal:     Cervical back: Normal range of motion.  Skin:    General: Skin is warm.  Neurological:     General: No focal deficit present.     Mental Status: She is alert.  Psychiatric:        Mood and Affect: Mood normal.     ED Results / Procedures / Treatments   Labs (all labs ordered are listed, but only abnormal results are displayed) Labs Reviewed  COMPREHENSIVE METABOLIC PANEL - Abnormal; Notable for the following components:      Result Value   CO2 34 (*)    Glucose, Bld 170 (*)    Calcium 8.6 (*)     Total Protein 6.3 (*)    AST 12 (*)    All other components within normal limits  CBC - Abnormal; Notable for the following components:   RBC 3.47 (*)    Hemoglobin 10.5 (*)    HCT 34.7 (*)    All other components within normal limits  URINALYSIS, ROUTINE W REFLEX MICROSCOPIC - Abnormal; Notable for the following components:   Hgb urine dipstick SMALL (*)    Nitrite POSITIVE (*)    Bacteria, UA RARE (*)    All other components within normal limits  OCCULT BLOOD X 1 CARD TO LAB, STOOL - Abnormal; Notable for the following components:   Fecal Occult Bld POSITIVE (*)    All other components within normal limits  LIPASE, BLOOD    EKG None  Radiology CT ABDOMEN PELVIS W CONTRAST  Result Date: 12/15/2022 CLINICAL DATA:  Abdominal pain, acute, nonlocalized. Pt presents with c/o rectal bleeding, started yesterday and stopped at 10 pm, starting again this morning. Reports everytime she goes to the restroom there is blood t EXAM: CT ABDOMEN AND PELVIS WITH CONTRAST TECHNIQUE: Multidetector CT imaging of the abdomen and pelvis was performed using the standard protocol following bolus administration of intravenous contrast. RADIATION DOSE REDUCTION: This exam was performed according to the departmental dose-optimization program which includes automated exposure control, adjustment of the mA and/or kV according to patient size and/or use of iterative reconstruction technique. CONTRAST:  90mL OMNIPAQUE IOHEXOL 300 MG/ML  SOLN COMPARISON:  CT abdomen pelvis 03/01/2022. FINDINGS: Lower chest: Similar-appearing reticulations.  No acute abnormality. Hepatobiliary: No focal liver abnormality. No gallstones, gallbladder wall thickening, or pericholecystic fluid. No biliary dilatation. Pancreas: No focal lesion. Normal pancreatic contour. No surrounding inflammatory changes. No main pancreatic ductal dilatation. Spleen: Normal in size without focal abnormality.  Splenule noted. Adrenals/Urinary Tract: No adrenal  nodule  bilaterally. Bilateral kidneys enhance symmetrically. Fluid density lesions likely represent simple renal cyst. Simple renal cysts, in the absence of clinically indicated signs/symptoms, require no independent follow-up. Subcentimeter hypodensities are too small to characterize-no further follow-up indicated. Punctate calcifications within the right kidney. No left nephrolithiasis. No ureterolithiasis bilaterally. No hydronephrosis. No hydroureter. Irregular urinary bladder wall thickening with loss of intraperitoneal fat between the loop of sigmoid colon posterior to the urinary bladder. Foci of gas within the urinary bladder lumen. On delayed imaging, there is no urothelial wall thickening and there are no filling defects in the opacified portions of the bilateral collecting systems or ureters. Stomach/Bowel: Stomach is within normal limits. No evidence of bowel wall thickening or dilatation. Colonic diverticulosis. Under distended rectum. The appendix is not definitely identified with no inflammatory changes in the right lower quadrant to suggest acute appendicitis. Vascular/Lymphatic: No abdominal aorta or iliac aneurysm. Mild atherosclerotic plaque of the aorta and its branches. No abdominal, pelvic, or inguinal lymphadenopathy. Reproductive: Status post hysterectomy. No adnexal masses. The left ovary again appears to be tethered to the region of possible colovesicular fistula. The right ovary is unremarkable. Other: No intraperitoneal free fluid. No intraperitoneal free gas. No organized fluid collection. Musculoskeletal: No abdominal wall hernia or abnormality. No suspicious lytic or blastic osseous lesions. No acute displaced fracture. Multilevel severe degenerative changes of the spine. Multilevel intervertebral disc space vacuum phenomenon. Grade 1 anterolisthesis of L4 on L5. Partially visualized intramedullary nail fixation of the left femur. IMPRESSION: 1. Persistent concern for possible  colovesicular fistula with left ovary tethered to this region. Correlate with urinalysis for superimposed urinary tract infection. 2. Colonic diverticulosis with no acute diverticulitis. 3.  Aortic Atherosclerosis (ICD10-I70.0). 4. Severe multilevel degenerative changes of the spine. Electronically Signed   By: Tish Frederickson M.D.   On: 12/15/2022 19:32    Procedures Procedures    Medications Ordered in ED Medications  iohexol (OMNIPAQUE) 300 MG/ML solution 100 mL (90 mLs Intravenous Contrast Given 12/15/22 1751)    ED Course/ Medical Decision Making/ A&P                             Medical Decision Making Patient complains of rectal bleeding since 10 PM last night.  Patient has seen bright red blood when she has a bowel movement.  Amount and/or Complexity of Data Reviewed Independent Historian:     Details: And is here with her son who is supportive External Data Reviewed: notes.    Details: Primary care notes reviewed.  GI notes reviewed.  She has been seen by Dr. Rhea [facility]ton GI.  She sees [facility].  She has been evaluated for colon stricture by Dr. Derrell Lolling. Labs: ordered. Decision-making details documented in ED Course.    Details: Labs ordered reviewed and interpreted patient's hemoglobin is 10.5.  Patient's hemoglobin 2 weeks ago was 12.1 Radiology: ordered and independent interpretation performed. Decision-making details documented in ED Course.    Details: CT scan abdomen and pelvis ordered reviewed and interpreted.  There is persistent concern for possible colovesicular fistula. Discussion of management or test interpretation with external provider(s): Hospitalist consulted for admission  Risk Decision regarding hospitalization. Risk Details: Patient has Hemoccult positive stool.  Patient has had a decrease in her hemoglobin from 12.1-10 0.5 over 2 weeks.             Final Clinical Impression(s) / ED Diagnoses Final diagnoses:  Rectal bleeding    Rx / DC  Orders ED Discharge Orders     None         Osie Cheeks 12/15/22 2348    Virgina Norfolk, DO 12/16/22 [id number]

## 2022-12-15 NOTE — Telephone Encounter (Signed)
Patient said that she started bleeding from rectum  around 12:30 pm yesterday and it lasted till about 10:00 pm. [place] Spaniel that she skipped a dosage of Eliquis last night but took one earlier in the day. Have not experienced any bleeding this morning and has not taken any this morning. What should she do?

## 2022-12-15 NOTE — Telephone Encounter (Signed)
Returned call to pt and she states that the bleeding was "a lot" but she has not had any since 10pm or this morning. Advised pt that it is common to have some bleeding being on Eliquis but to monitor it and if it comes back to go to the ER. Pt verbalized understanding.

## 2022-12-15 NOTE — ED Notes (Signed)
ED Provider at bedside. 

## 2022-12-15 NOTE — ED Triage Notes (Signed)
Pt presents with c/o rectal bleeding, started yesterday and stopped at 10 pm, starting again this morning. Reports everytime she goes to the restroom there is blood there. Denies pain, denies n/v, reports loose stools, but she eats soft food due to problems with Colon. Reports she has left ovary and bladder are pushing against colon and diverticula present. Pt is on eliquis 5mg  twice daily.

## 2022-12-16 DIAGNOSIS — I120 Hypertensive chronic kidney disease with stage 5 chronic kidney disease or end stage renal disease: Secondary | ICD-10-CM | POA: Diagnosis not present

## 2022-12-16 DIAGNOSIS — M79604 Pain in right leg: Secondary | ICD-10-CM | POA: Diagnosis not present

## 2022-12-16 DIAGNOSIS — M069 Rheumatoid arthritis, unspecified: Secondary | ICD-10-CM | POA: Diagnosis present

## 2022-12-16 DIAGNOSIS — M79661 Pain in right lower leg: Secondary | ICD-10-CM | POA: Diagnosis not present

## 2022-12-16 DIAGNOSIS — Z794 Long term (current) use of insulin: Secondary | ICD-10-CM | POA: Diagnosis not present

## 2022-12-16 DIAGNOSIS — J8410 Pulmonary fibrosis, unspecified: Secondary | ICD-10-CM | POA: Diagnosis present

## 2022-12-16 DIAGNOSIS — I5032 Chronic diastolic (congestive) heart failure: Secondary | ICD-10-CM | POA: Diagnosis present

## 2022-12-16 DIAGNOSIS — I130 Hypertensive heart and chronic kidney disease with heart failure and stage 1 through stage 4 chronic kidney disease, or unspecified chronic kidney disease: Secondary | ICD-10-CM | POA: Diagnosis present

## 2022-12-16 DIAGNOSIS — J4489 Other specified chronic obstructive pulmonary disease: Secondary | ICD-10-CM | POA: Diagnosis present

## 2022-12-16 DIAGNOSIS — E1122 Type 2 diabetes mellitus with diabetic chronic kidney disease: Secondary | ICD-10-CM | POA: Diagnosis present

## 2022-12-16 DIAGNOSIS — N183 Chronic kidney disease, stage 3 unspecified: Secondary | ICD-10-CM | POA: Diagnosis not present

## 2022-12-16 DIAGNOSIS — M545 Low back pain, unspecified: Secondary | ICD-10-CM | POA: Diagnosis not present

## 2022-12-16 DIAGNOSIS — K922 Gastrointestinal hemorrhage, unspecified: Secondary | ICD-10-CM

## 2022-12-16 DIAGNOSIS — M5136 Other intervertebral disc degeneration, lumbar region: Secondary | ICD-10-CM | POA: Diagnosis not present

## 2022-12-16 DIAGNOSIS — J9610 Chronic respiratory failure, unspecified whether with hypoxia or hypercapnia: Secondary | ICD-10-CM

## 2022-12-16 DIAGNOSIS — M5126 Other intervertebral disc displacement, lumbar region: Secondary | ICD-10-CM | POA: Diagnosis not present

## 2022-12-16 DIAGNOSIS — J479 Bronchiectasis, uncomplicated: Secondary | ICD-10-CM | POA: Diagnosis present

## 2022-12-16 DIAGNOSIS — M160 Bilateral primary osteoarthritis of hip: Secondary | ICD-10-CM | POA: Diagnosis not present

## 2022-12-16 DIAGNOSIS — E1129 Type 2 diabetes mellitus with other diabetic kidney complication: Secondary | ICD-10-CM | POA: Diagnosis not present

## 2022-12-16 DIAGNOSIS — I2699 Other pulmonary embolism without acute cor pulmonale: Secondary | ICD-10-CM | POA: Diagnosis not present

## 2022-12-16 DIAGNOSIS — I10 Essential (primary) hypertension: Secondary | ICD-10-CM

## 2022-12-16 DIAGNOSIS — E1140 Type 2 diabetes mellitus with diabetic neuropathy, unspecified: Secondary | ICD-10-CM | POA: Diagnosis present

## 2022-12-16 DIAGNOSIS — J849 Interstitial pulmonary disease, unspecified: Secondary | ICD-10-CM | POA: Diagnosis not present

## 2022-12-16 DIAGNOSIS — Z9981 Dependence on supplemental oxygen: Secondary | ICD-10-CM | POA: Diagnosis not present

## 2022-12-16 DIAGNOSIS — Z6841 Body Mass Index (BMI) 40.0 and over, adult: Secondary | ICD-10-CM | POA: Diagnosis not present

## 2022-12-16 DIAGNOSIS — E7800 Pure hypercholesterolemia, unspecified: Secondary | ICD-10-CM | POA: Diagnosis present

## 2022-12-16 DIAGNOSIS — Z86718 Personal history of other venous thrombosis and embolism: Secondary | ICD-10-CM | POA: Diagnosis not present

## 2022-12-16 DIAGNOSIS — N1831 Chronic kidney disease, stage 3a: Secondary | ICD-10-CM | POA: Diagnosis present

## 2022-12-16 DIAGNOSIS — K625 Hemorrhage of anus and rectum: Secondary | ICD-10-CM

## 2022-12-16 DIAGNOSIS — K632 Fistula of intestine: Secondary | ICD-10-CM | POA: Diagnosis present

## 2022-12-16 DIAGNOSIS — M5135 Other intervertebral disc degeneration, thoracolumbar region: Secondary | ICD-10-CM | POA: Diagnosis not present

## 2022-12-16 DIAGNOSIS — G894 Chronic pain syndrome: Secondary | ICD-10-CM | POA: Diagnosis present

## 2022-12-16 DIAGNOSIS — Z86711 Personal history of pulmonary embolism: Secondary | ICD-10-CM | POA: Diagnosis not present

## 2022-12-16 DIAGNOSIS — M47816 Spondylosis without myelopathy or radiculopathy, lumbar region: Secondary | ICD-10-CM | POA: Diagnosis not present

## 2022-12-16 DIAGNOSIS — I272 Pulmonary hypertension, unspecified: Secondary | ICD-10-CM | POA: Diagnosis present

## 2022-12-16 DIAGNOSIS — M48061 Spinal stenosis, lumbar region without neurogenic claudication: Secondary | ICD-10-CM | POA: Diagnosis not present

## 2022-12-16 DIAGNOSIS — E039 Hypothyroidism, unspecified: Secondary | ICD-10-CM | POA: Diagnosis present

## 2022-12-16 DIAGNOSIS — J9611 Chronic respiratory failure with hypoxia: Secondary | ICD-10-CM | POA: Diagnosis present

## 2022-12-16 DIAGNOSIS — K5731 Diverticulosis of large intestine without perforation or abscess with bleeding: Secondary | ICD-10-CM | POA: Diagnosis present

## 2022-12-16 DIAGNOSIS — D62 Acute posthemorrhagic anemia: Secondary | ICD-10-CM

## 2022-12-16 DIAGNOSIS — Z79890 Hormone replacement therapy (postmenopausal): Secondary | ICD-10-CM | POA: Diagnosis not present

## 2022-12-16 LAB — HEMOGLOBIN AND HEMATOCRIT, BLOOD
HCT: 30.6 % — ABNORMAL LOW (ref 36.0–46.0)
HCT: 30.6 % — ABNORMAL LOW (ref 36.0–46.0)
Hemoglobin: 9.1 g/dL — ABNORMAL LOW (ref 12.0–15.0)
Hemoglobin: 9 g/dL — ABNORMAL LOW (ref 12.0–15.0)

## 2022-12-16 LAB — TYPE AND SCREEN
ABO/RH(D): O POS
Antibody Screen: NEGATIVE

## 2022-12-16 LAB — GLUCOSE, CAPILLARY
Glucose-Capillary: 86 mg/dL (ref 70–99)
Glucose-Capillary: 124 mg/dL — ABNORMAL HIGH (ref 70–99)
Glucose-Capillary: 194 mg/dL — ABNORMAL HIGH (ref 70–99)
Glucose-Capillary: 135 mg/dL — ABNORMAL HIGH (ref 70–99)

## 2022-12-16 MED ORDER — METOPROLOL SUCCINATE ER 100 MG PO TB24: 100.0000 mg | ORAL_TABLET | Freq: Every day | ORAL | Status: DC

## 2022-12-16 MED ORDER — PANTOPRAZOLE SODIUM 40 MG PO TBEC
40.0000 mg | DELAYED_RELEASE_TABLET | Freq: Two times a day (BID) | ORAL | Status: DC
  Administered 2022-12-16 – 2022-12-19 (×8): 40 mg via ORAL
  Filled 2022-12-16 (×8): qty 1

## 2022-12-16 MED ORDER — SODIUM CHLORIDE 0.9% FLUSH: 3.0000 mL | Freq: Two times a day (BID) | INTRAVENOUS | Status: DC

## 2022-12-16 MED ORDER — HYDRALAZINE HCL 25 MG PO TABS: 25.0000 mg | ORAL_TABLET | Freq: Two times a day (BID) | ORAL | Status: DC

## 2022-12-16 MED ORDER — SODIUM CHLORIDE 0.9% FLUSH: 3.0000 mL | INTRAVENOUS | Status: DC | PRN

## 2022-12-16 MED ORDER — MORPHINE SULFATE ER 15 MG PO TBCR
15.0000 mg | EXTENDED_RELEASE_TABLET | Freq: Two times a day (BID) | ORAL | Status: DC
  Administered 2022-12-16 – 2022-12-18 (×5): 15 mg via ORAL
  Filled 2022-12-16 (×5): qty 1

## 2022-12-16 MED ORDER — PREDNISONE 10 MG PO TABS
10.0000 mg | ORAL_TABLET | Freq: Every day | ORAL | Status: DC
  Administered 2022-12-16 – 2022-12-19 (×4): 10 mg via ORAL
  Filled 2022-12-16 (×4): qty 1

## 2022-12-16 MED ORDER — AMLODIPINE BESYLATE 10 MG PO TABS: 10.0000 mg | ORAL_TABLET | Freq: Every day | ORAL | Status: DC

## 2022-12-16 MED ORDER — GABAPENTIN 300 MG PO CAPS
1200.0000 mg | ORAL_CAPSULE | Freq: Three times a day (TID) | ORAL | Status: DC
  Administered 2022-12-16 – 2022-12-19 (×11): 1200 mg via ORAL
  Filled 2022-12-16 (×11): qty 4

## 2022-12-16 MED ORDER — ACETAMINOPHEN 500 MG PO TABS
1000.0000 mg | ORAL_TABLET | Freq: Once | ORAL | Status: AC
  Administered 2022-12-16: 1000 mg via ORAL
  Filled 2022-12-16: qty 2

## 2022-12-16 MED ORDER — LEVOTHYROXINE SODIUM 50 MCG PO TABS
50.0000 ug | ORAL_TABLET | Freq: Every day | ORAL | Status: DC
  Administered 2022-12-16 – 2022-12-19 (×4): 50 ug via ORAL
  Filled 2022-12-16 (×5): qty 1

## 2022-12-16 MED ORDER — LEFLUNOMIDE 10 MG PO TABS
20.0000 mg | ORAL_TABLET | Freq: Every day | ORAL | Status: DC
  Administered 2022-12-16 – 2022-12-19 (×4): 20 mg via ORAL
  Filled 2022-12-16: qty 1
  Filled 2022-12-16 (×4): qty 2

## 2022-12-16 MED ORDER — IRBESARTAN 150 MG PO TABS: 150.0000 mg | ORAL_TABLET | Freq: Every day | ORAL | Status: DC

## 2022-12-16 MED ORDER — DONEPEZIL HCL 10 MG PO TABS
10.0000 mg | ORAL_TABLET | Freq: Every day | ORAL | Status: DC
  Administered 2022-12-16 – 2022-12-18 (×3): 10 mg via ORAL
  Filled 2022-12-16 (×4): qty 1

## 2022-12-16 MED ORDER — LIDOCAINE 5 % EX PTCH
1.0000 | MEDICATED_PATCH | CUTANEOUS | Status: DC
  Administered 2022-12-16 – 2022-12-19 (×4): 1 via TRANSDERMAL
  Filled 2022-12-16 (×4): qty 1

## 2022-12-16 MED ORDER — HYDROXYCHLOROQUINE SULFATE 200 MG PO TABS
200.0000 mg | ORAL_TABLET | Freq: Every day | ORAL | Status: DC
  Administered 2022-12-16 – 2022-12-19 (×4): 200 mg via ORAL
  Filled 2022-12-16 (×5): qty 1

## 2022-12-16 MED ORDER — GUAIFENESIN ER 600 MG PO TB12
1200.0000 mg | ORAL_TABLET | Freq: Two times a day (BID) | ORAL | Status: DC
  Filled 2022-12-16: qty 2

## 2022-12-16 MED ORDER — ALLOPURINOL 100 MG PO TABS
300.0000 mg | ORAL_TABLET | Freq: Every day | ORAL | Status: DC
  Administered 2022-12-16 – 2022-12-19 (×4): 300 mg via ORAL
  Filled 2022-12-16 (×4): qty 3

## 2022-12-16 MED ORDER — FUROSEMIDE 40 MG PO TABS: 80.0000 mg | ORAL_TABLET | Freq: Every day | ORAL | Status: DC

## 2022-12-16 MED ORDER — ACETAMINOPHEN 325 MG PO TABS
650.0000 mg | ORAL_TABLET | Freq: Once | ORAL | Status: AC | PRN
  Administered 2022-12-17: 650 mg via ORAL
  Filled 2022-12-16: qty 2

## 2022-12-16 MED ORDER — SODIUM CHLORIDE 0.9 % IV SOLN: 250.0000 mL | INTRAVENOUS | Status: DC | PRN

## 2022-12-16 MED ORDER — DULOXETINE HCL 60 MG PO CPEP
60.0000 mg | ORAL_CAPSULE | Freq: Every day | ORAL | Status: DC
  Administered 2022-12-16 – 2022-12-19 (×4): 60 mg via ORAL
  Filled 2022-12-16 (×5): qty 1

## 2022-12-16 MED ORDER — FUROSEMIDE 40 MG PO TABS
80.0000 mg | ORAL_TABLET | Freq: Every day | ORAL | Status: DC
  Administered 2022-12-17 – 2022-12-19 (×3): 80 mg via ORAL
  Filled 2022-12-16 (×3): qty 2

## 2022-12-16 MED ORDER — ATORVASTATIN CALCIUM 10 MG PO TABS
10.0000 mg | ORAL_TABLET | Freq: Every day | ORAL | Status: DC
  Administered 2022-12-16 – 2022-12-19 (×4): 10 mg via ORAL
  Filled 2022-12-16 (×4): qty 1

## 2022-12-16 MED ORDER — METOPROLOL SUCCINATE ER 25 MG PO TB24
125.0000 mg | ORAL_TABLET | Freq: Every day | ORAL | Status: DC
  Administered 2022-12-16 – 2022-12-19 (×4): 125 mg via ORAL
  Filled 2022-12-16 (×4): qty 1

## 2022-12-16 MED ORDER — LINACLOTIDE 72 MCG PO CAPS
72.0000 ug | ORAL_CAPSULE | Freq: Every day | ORAL | Status: DC
  Filled 2022-12-16 (×4): qty 1

## 2022-12-16 MED ORDER — AZITHROMYCIN 250 MG PO TABS
250.0000 mg | ORAL_TABLET | ORAL | Status: DC
  Administered 2022-12-17 – 2022-12-19 (×2): 250 mg via ORAL
  Filled 2022-12-16 (×2): qty 1

## 2022-12-16 NOTE — Plan of Care (Signed)
  Problem: Safety: Goal: Ability to remain free from injury will improve Outcome: Progressing   

## 2022-12-16 NOTE — Consult Note (Addendum)
Consultation  Referring Provider:TRH/Ortiz Primary Care Physician:  Joan Saint, MD Primary Gastroenterologist:  Dr.Nandigam   Reason for Consultation: acute GI bleed  HPI: [name] is a 82 y.o. female, established with Dr. Lavon Mcdaniel, with multiple significant comorbidities including adult onset diabetes mellitus, hypertension, rheumatoid arthritis, bronchiectasis with chronic respiratory failure O2 dependent at home, chronic kidney disease stage III, history of congestive heart failure and history of prior DVT/PE 2023 for which she is on Eliquis. She had presented to the emergency room yesterday evening after onset of lower abdominal cramping and pain on Sunday, 12/14/2022 followed by bloody bowel movements.  She says that she had 3-4 episodes on Sunday, decided to stay home and observe.  The following morning she was not having as much cramping but passed another grossly bloody bowel movement with dark red blood and called her PCP who advised her to come to the emergency room. Has not had any associated nausea or vomiting, no fever or chills. Today she says the abdominal cramping has for the most part stopped.  She is still having bloody stools but feels less volume and not as frequent.  Her last episode was about 8:00 this morning Her last dose of Eliquis was on Sunday morning 12/14/2022, she held this herself because of the bleeding. She has felt weak and somewhat lightheaded off and on since yesterday.  Workup in the ER with CT of the abdomen pelvis with contrast shows an irregular urinary bladder with wall thickening and loss of intraperitoneal fat between a loop of the sigmoid colon posterior to the urinary bladder, there is a small foci of gas within the bladder concerning for colovesicular fistula, colonic diverticulosis without diverticulitis, no bowel wall thickening.  She did have labs as an outpatient on 11/28/2022 with hemoglobin 12.1 On presentation yesterday  hemoglobin 10.5 and this morning down to 9.5 with hematocrit 31.6. [name]'s yesterday with sodium 143/potassium 3.9/118/creatinine 0.85 UA with negative leukocytes positive nitrite 0-5 WBCs rare bacteria   Had undergone colonoscopy last fall at the hospital per Dr. Rhea Mcdaniel November 2023 because of a prior CT raising question of possible colovesicular fistula.  She was found to have a benign stenosis measuring about 4 cm in length in the mid sigmoid which was traversable with the scope and felt to be diverticular in origin, she had multiple diverticuli in the sigmoid and descending colon and 2 small polyps were found but not removed.  She was referred to surgery and had consultation with Dr. Derrell Mcdaniel about by his notes that was primary related to concern for colonic stenosis, there was no mention about concern for colovesicular fistula. She does have a lot of chronic pain secondary to arthritis and has been having pain in her lower back and pain off and on bilaterally in her lower abdomen groin and radiating into her thighs anteriorly.  She says recently this has been worse on the right side.  She does not give any discrete pelvic pain and no dysuria type symptoms or pneumaturia.    Past Medical History:  Diagnosis Date   Acute on chronic diastolic (congestive) heart failure (HCC)    Anemia    iron deficiency anemia - secondary to blood loss ( chronic)    Anxiety    Arthritis    endstage changes bilateral knees/bilateral ankles.    Asthma    Carotid artery occlusion    Chronic fatigue    Chronic kidney disease    Closed left hip fracture (HCC)  Clotting disorder (HCC)    pt denies this   Contusion of left knee    due to fall 1/14.   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     Morbid obesity (HCC)    Neuromuscular disorder (HCC)    diabetic neuropathy    Normal coronary arteries    cardiac catheterization performed  10/31/14   Pneumonia    RA (rheumatoid arthritis) (HCC)    has been off methotreaxte since 10/13.   Spinal stenosis of lumbar region    Thyroid disease    Unspecified lack of coordination    URI (upper respiratory infection)     Past Surgical History:  Procedure Laterality Date   BRAIN SURGERY     Gamma knife 10/13. Needs repeat spring  '14   CARDIAC CATHETERIZATION N/A 10/31/2014   Procedure: Right/Left Heart Cath and Coronary Angiography;  Surgeon: Corky Crafts, MD;  Location: [facility] INVASIVE CV LAB;  Service: Cardiovascular;  Laterality: N/A;   COLONOSCOPY WITH PROPOFOL N/A 04/14/2022   Procedure: COLONOSCOPY WITH PROPOFOL;  Surgeon: Beverley Fiedler, MD;  Location: [facility] ENDOSCOPY;  Service: Gastroenterology;  Laterality: N/A;   CYST EXCISION     2 on face   CYST REMOVAL NECK     ESOPHAGOGASTRODUODENOSCOPY (EGD) WITH PROPOFOL N/A 09/14/2014   Procedure: ESOPHAGOGASTRODUODENOSCOPY (EGD) WITH PROPOFOL;  Surgeon: Louis Meckel, MD;  Location: [facility] ENDOSCOPY;  Service: Endoscopy;  Laterality: N/A;   INCISION AND DRAINAGE HIP Left 01/16/2017   Procedure: IRRIGATION AND DEBRIDEMENT LEFT HIP;  Surgeon: Kathryne Hitch, MD;  Location: [facility] ORS;  Service: Orthopedics;  Laterality: Left;   INTRAMEDULLARY (IM) NAIL INTERTROCHANTERIC Left 11/29/2016   Procedure: INTRAMEDULLARY (IM) NAIL INTERTROCHANTRIC;  Surgeon: Kathryne Hitch, MD;  Location: [facility] OR;  Service: Orthopedics;  Laterality: Left;   OVARY SURGERY     RIGHT HEART CATH N/A 08/12/2021   Procedure: RIGHT HEART CATH;  Surgeon:  Runell Gess, MD;  Location: [facility] INVASIVE CV LAB;  Service: Cardiovascular;  Laterality: N/A;   SHOULDER SURGERY Left    TONSILLECTOMY  age 78   VAGINAL HYSTERECTOMY     VIDEO BRONCHOSCOPY Bilateral 05/31/2013   Procedure: VIDEO BRONCHOSCOPY WITHOUT FLUORO;  Surgeon: Kalman Shan, MD;  Location: [facility] ENDOSCOPY;  Service: Cardiopulmonary;  Laterality: Bilateral;   video bronscoscopy  05/19/1998   lung    Prior to Admission medications   Medication Sig Start Date End Date Taking? Authorizing Provider  acetaminophen (TYLENOL) 500 MG tablet Take 1,000 mg by mouth as needed for moderate pain.   Yes [provider]  albuterol (PROVENTIL) (2.5 MG/3ML) 0.083% nebulizer solution Take 3 mLs (2.5 mg total) by nebulization every 6 (six) hours as needed for wheezing. Patient taking differently: Take 2.5 mg by nebulization 2 (two) times daily as needed for wheezing or shortness of breath. 05/03/21  Yes Medina-Vargas, Monina C, NP  albuterol (VENTOLIN HFA) 108 (90 Base) MCG/ACT inhaler Inhale  2 puffs into the lungs as needed for wheezing or shortness of breath.   Yes [provider]  allopurinol (ZYLOPRIM) 300 MG tablet Take 1 tablet (300 mg total) by mouth daily. 05/03/21  Yes Medina-Vargas, Monina C, NP  amLODipine (NORVASC) 10 MG tablet Take 10 mg by mouth daily. 02/12/22  Yes [provider]  apixaban (ELIQUIS) 5 MG TABS tablet Take 1 tablet (5 mg total) by mouth 2 (two) times daily. 09/02/22  Yes Runell Gess, MD  Artificial Tear Ointment (DRY EYES OP) Place 2 drops into both eyes as needed (dry eyes).   Yes [provider]  atorvastatin (LIPITOR) 10 MG tablet Take 1 tablet (10 mg total) by mouth daily. Patient taking differently: Take 10 mg by mouth at bedtime. 08/14/22  Yes Joan Saint, MD  azithromycin (ZITHROMAX) 250 MG tablet Take 250 mg by mouth 3 (three) times a week. Monday, Wednesday, and Friday 11/23/21  Yes [provider]  Calcium  Carb-Cholecalciferol (CALCIUM + D3 PO) Take 2 tablets by mouth daily.   Yes [provider]  Camphor-Menthol-Methyl Sal (SALONPAS EX) Apply 1 patch topically as needed (shoulder and leg pain).   Yes [provider]  Cholecalciferol (VITAMIN D3) 50 MCG (2000 UT) capsule Take 2,000 Units by mouth daily.   Yes [provider]  colchicine 0.6 MG tablet Take 1 tablet (0.6 mg total) by mouth daily as needed (gout flares). 05/03/21  Yes Medina-Vargas, Monina C, NP  cyclobenzaprine (FLEXERIL) 5 MG tablet Take 1 tablet (5 mg total) by mouth at bedtime as needed for muscle spasms. 11/27/22  Yes Jones Bales, NP  diclofenac Sodium (VOLTAREN) 1 % GEL Apply 2 g topically 3 (three) times daily. Apply to both hands and knees Patient taking differently: Apply 1 Application topically in the morning, at noon, and at bedtime. 05/03/21  Yes Medina-Vargas, Monina C, NP  donepezil (ARICEPT) 10 MG tablet Take 1 tablet (10 mg total) by mouth at bedtime. 09/08/22  Yes Anson Fret, MD  DULoxetine (CYMBALTA) 60 MG capsule Take 1 capsule (60 mg total) by mouth daily. 11/27/22  Yes Jones Bales, NP  etanercept (ENBREL SURECLICK) 50 MG/ML injection Inject 50 mg into the skin once a week.   Yes [provider]  fluticasone (FLONASE) 50 MCG/ACT nasal spray Place 1 spray into both nostrils daily. Patient taking differently: Place 2 sprays into both nostrils daily. 11/28/22  Yes Joan Saint, MD  furosemide (LASIX) 80 MG tablet TAKE 1 TABLET(80 MG) BY MOUTH DAILY 08/14/22  Yes Runell Gess, MD  gabapentin (NEURONTIN) 600 MG tablet Take 2 tablets (1,200 mg total) by mouth 3 (three) times daily. 11/27/22  Yes Jones Bales, NP  guaiFENesin (MUCINEX) 600 MG 12 hr tablet Take 2 tablets (1,200 mg total) by mouth 2 (two) times daily. 05/03/21  Yes Medina-Vargas, Monina C, NP  hydrALAZINE (APRESOLINE) 25 MG tablet TAKE 1 TABLET(25 MG) BY MOUTH TWICE DAILY 08/21/22  Yes Runell Gess,  MD  hydroxychloroquine (PLAQUENIL) 200 MG tablet Take 1 tablet (200 mg total) by mouth daily. 05/03/21  Yes Medina-Vargas, Monina C, NP  leflunomide (ARAVA) 20 MG tablet Take 1 tablet (20 mg total) by mouth daily. 05/03/21 08/09/23 Yes Medina-Vargas, Monina C, NP  levothyroxine (SYNTHROID) 50 MCG tablet Take 1 tablet (50 mcg total) by mouth daily. 11/28/22  Yes Joan Saint, MD  linaclotide [facility]) 72 MCG capsule Take 1 capsule (72 mcg total) by mouth daily before breakfast. 08/14/22  Yes Joan Saint, MD  Menthol, Topical Analgesic, (BIOFREEZE ROLL-ON EX) Apply 1 Application topically as needed (pain).   Yes [provider]  metolazone (ZAROXOLYN) 2.5 MG tablet TAKE 1 TABLET BY MOUTH DAILY AS NEEDED FOR 2 DAYS FOR WEIGHT ABOVE 255 POUNDS 05/03/21  Yes Medina-Vargas, Monina C, NP  metoprolol succinate (TOPROL-XL) 100 MG 24 hr tablet TAKE 1 TABLET BY MOUTH DAILY ALONG WITH 25 MG TABLET Patient taking differently: Take 100 mg by mouth daily. 12/10/22  Yes Runell Gess, MD  metoprolol succinate (TOPROL-XL) 25 MG 24 hr tablet Take 25 mg (1 tab) along with a 100 mg tab for a total daily dose of 125 mg. Patient taking differently: Take 25 mg by mouth daily. 11/28/22  Yes Joan Saint, MD  morphine (MS CONTIN) 15 MG 12 hr tablet Take 1 tablet (15 mg total) by mouth every 12 (twelve) hours. 11/27/22  Yes Jones Bales, NP  morphine (MSIR) 15 MG tablet Take 1 tablet (15 mg total) by mouth 2 (two) times daily. Patient taking differently: Take 15 mg by mouth 2 (two) times daily as needed (break through pain). 11/27/22  Yes Jones Bales, NP  Multiple Vitamin (MULTIVITAMIN WITH MINERALS) TABS Take 1 tablet by mouth daily.    Yes [provider]  olmesartan (BENICAR) 40 MG tablet Take 1 tablet (40 mg total) by mouth daily. 11/28/22  Yes Joan Saint, MD  ondansetron (ZOFRAN-ODT) 4 MG disintegrating tablet Take 1 tablet (4 mg total) by mouth every 8 (eight) hours as needed  for nausea or vomiting. 06/26/22  Yes Nandigam, Eleonore Chiquito, MD  OXYGEN Inhale 3 L into the lungs See admin instructions. 3 L at bedtime, and 3 L during the day as needed for exertion   Yes [provider]  pantoprazole (PROTONIX) 40 MG tablet Take 1 tablet (40 mg total) by mouth 2 (two) times daily. take 1 tablet by mouth once daily MINUTES BEFORE 1ST MEAL OF THE DAY 06/26/22  Yes Nandigam, Eleonore Chiquito, MD  Potassium Chloride ER 20 MEQ TBCR Take 1 tablet (20 mEq total) by mouth daily. 11/28/22  Yes Joan Saint, MD  predniSONE (DELTASONE) 10 MG tablet Take 1 tablet (10 mg total) by mouth daily with breakfast. 05/22/21  Yes Joan Saint, MD  Semaglutide,0.25 or 0.5MG /DOS, (OZEMPIC, 0.25 OR 0.5 MG/DOSE,) 2 MG/1.5ML SOPN Inject 0.5 mg into the skin once a week. 08/19/22  Yes Joan Saint, MD    Current Facility-Administered Medications  Medication Dose Route Frequency Provider Last Rate Last Admin   allopurinol (ZYLOPRIM) tablet 300 mg  300 mg Oral Daily Marinda Elk, MD   300 mg at 12/16/22 0932   atorvastatin (LIPITOR) tablet 10 mg  10 mg Oral Daily Marinda Elk, MD   10 mg at 12/16/22 0921   [START ON 12/17/2022] azithromycin [facility]) tablet 250 mg  250 mg Oral Once per day on Monday Wednesday Friday Marinda Elk, MD       donepezil (ARICEPT) tablet 10 mg  10 mg Oral QHS Marinda Elk, MD       DULoxetine (CYMBALTA) DR capsule 60 mg  60 mg Oral Daily Marinda Elk, MD   60 mg at 12/16/22 0921   [START ON 12/17/2022] furosemide (LASIX) tablet 80 mg  80 mg Oral Daily Marinda Elk, MD       gabapentin (NEURONTIN) capsule 1,200 mg  1,200 mg Oral TID Marinda Elk, MD  1,200 mg at 12/16/22 5284   hydroxychloroquine (PLAQUENIL) tablet 200 mg  200 mg Oral Daily Marinda Elk, MD       insulin aspart (novoLOG) injection 0-5 Units  0-5 Units Subcutaneous QHS Marinda Elk, MD       insulin aspart (novoLOG) injection 0-9 Units   0-9 Units Subcutaneous TID WC Marinda Elk, MD       leflunomide (ARAVA) tablet 20 mg  20 mg Oral Daily David Stall, Darin Engels, MD       levothyroxine (SYNTHROID) tablet 50 mcg  50 mcg Oral Daily Marinda Elk, MD   50 mcg at 12/16/22 0716   lidocaine (LIDODERM) 5 % 1 patch  1 patch Transdermal Q24H Marinda Elk, MD       linaclotide [facility]) capsule 72 mcg  72 mcg Oral QAC breakfast Marinda Elk, MD       metoprolol succinate (TOPROL-XL) 24 hr tablet 125 mg  125 mg Oral Daily Marinda Elk, MD   125 mg at 12/16/22 1324   morphine (MS CONTIN) 12 hr tablet 15 mg  15 mg Oral Q12H Marinda Elk, MD   15 mg at 12/16/22 4010   pantoprazole (PROTONIX) EC tablet 40 mg  40 mg Oral BID Marinda Elk, MD   40 mg at 12/16/22 2725   predniSONE (DELTASONE) tablet 10 mg  10 mg Oral Q breakfast Marinda Elk, MD   10 mg at 12/16/22 3664    Allergies as of 12/15/2022 - Review Complete 12/15/2022  Allergen Reaction Noted   Codeine Other (See Comments) and Swelling 09/11/2011   Infliximab Anaphylaxis 09/11/2011   Lisinopril Swelling and Rash 09/11/2011   Ofev [nintedanib] Diarrhea 04/01/2022    Family History  Problem Relation Age of Onset   Diabetes Mother    Heart attack Mother    Hypertension Father    Lung cancer Father    Diabetes Sister    Breast cancer Sister    Breast cancer Sister    Diabetes Brother    Hypertension Brother    Heart disease Brother    Heart attack Brother    Kidney cancer Brother    Gout Brother    Kidney failure Brother        x 5   Esophageal cancer Brother    Stomach cancer Brother    Prostate cancer Brother    Uterine cancer Daughter        with mets   Fibroids Daughter    Hypertension Daughter    Hypertension Daughter    Hypertension Son    Heart murmur Son    Rheum arthritis Maternal Uncle    Alzheimer's disease Neg Hx    Dementia Neg Hx     Social History   Socioeconomic History   Marital  status: Married    Spouse name: Not on file   Number of children: 6   Years of education: college   Highest education level: Not on file  Occupational History   Occupation: retired Engineer, civil (consulting)  Tobacco Use   Smoking status: Never   Smokeless tobacco: Never  Vaping Use   Vaping status: Never Used  Substance and Sexual Activity   Alcohol use: No   Drug use: No   Sexual activity: Not Currently  Other Topics Concern   Not on file  Social History Narrative   Patient consumes 2-3 cups coffee per day.   Social Determinants of Health   Financial Resource Strain: Low Risk  (  10/27/2022)   Overall Financial Resource Strain (CARDIA)    Difficulty of Paying Living Expenses: Not hard at all  Food Insecurity: No Food Insecurity (12/16/2022)   Hunger Vital Sign    Worried About Running Out of Food in the Last Year: Never true    Ran Out of Food in the Last Year: Never true  Transportation Needs: No Transportation Needs (12/16/2022)   PRAPARE - Administrator, Civil Service (Medical): No    Lack of Transportation (Non-Medical): No  Physical Activity: Insufficiently Active (10/27/2022)   Exercise Vital Sign    Days of Exercise per Week: 7 days    Minutes of Exercise per Session: 20 min  Stress: No Stress Concern Present (10/27/2022)   [employer] of Occupational Health - Occupational Stress Questionnaire    Feeling of Stress : Not at all  Social Connections: Socially Integrated (10/27/2022)   Social Connection and Isolation Panel [NHANES]    Frequency of Communication with Friends and Family: More than three times a week    Frequency of Social Gatherings with Friends and Family: More than three times a week    Attends Religious Services: More than 4 times per year    Active Member of [employer] or Organizations: Yes    Attends Engineer, structural: More than 4 times per year    Marital Status: Married  Catering manager Violence: Not At Risk (12/16/2022)   Humiliation, Afraid,  Rape, and Kick questionnaire    Fear of Current or Ex-Partner: No    Emotionally Abused: No    Physically Abused: No    Sexually Abused: No    Review of Systems: Pertinent positive and negative review of systems were noted in the above HPI section.  All other review of systems was otherwise negative.   Physical Exam: Vital signs in last 24 hours: Temp:  [97.7 F (36.5 C)-98.5 F (36.9 C)] 97.9 F (36.6 C) (07/30 0833) Pulse Rate:  [80-99] 80 (07/30 0833) Resp:  [11-22] 20 (07/30 0833) BP: (94-139)/(65-84) 139/84 (07/30 0833) SpO2:  [92 %-97 %] 97 % (07/30 0833) Weight:  [103.9 kg] 103.9 kg (07/29 1344) Last BM Date : 12/16/22 General:   Alert,  Well-developed, well-nourished, elderly African-American female pleasant and cooperative in NAD Head:  Normocephalic and atraumatic. Eyes:  Sclera clear, no icterus.   Conjunctiva pink. Ears:  Normal auditory acuity. Nose:  No deformity, discharge,  or lesions. Mouth:  No deformity or lesions.   Neck:  Supple; no masses or thyromegaly. Lungs:  Clear throughout to auscultation.   No wheezes, crackles, or rhonchi.  Heart:  Regular rate and rhythm; no murmurs, clicks, rubs,  or gallops. Abdomen:  Soft, obese, there is some tenderness bilateral lower quadrants right greater than left, no rebound ,BS active,nonpalp mass or hsm.   Rectal: Not done Msk:  Symmetrical without gross deformities. . Pulses:  Normal pulses noted. Extremities:  Without clubbing or edema. Neurologic:  Alert and  oriented x4;  grossly normal neurologically. Skin:  Intact without significant lesions or rashes.. Psych:  Alert and cooperative. Normal mood and affect.  Intake/Output from previous day: No intake/output data recorded. Intake/Output this shift: No intake/output data recorded.  Lab Results: Recent Labs    12/15/22 1346 12/16/22 0628  WBC 9.4  --   HGB 10.5* 9.5*  HCT 34.7* 31.6*  PLT 156  --    BMET Recent Labs    12/15/22 1346  NA 143  K  3.9  CL 101  CO2 34*  GLUCOSE 170*  BUN 18  CREATININE 0.85  CALCIUM 8.6*   LFT Recent Labs    12/15/22 1346  PROT 6.3*  ALBUMIN 4.0  AST 12*  ALT 15  ALKPHOS 64  BILITOT 0.4   PT/INR No results for input(s): "LABPROT", "INR" in the last 72 hours. Hepatitis Panel No results for input(s): "HEPBSAG", "HCVAB", "HEPAIGM", "HEPBIGM" in the last 72 hours.    IMPRESSION:  #74 82 year old African-American female with acute lower GI bleed very likely diverticular in origin in setting of chronic Eliquis.  She did have associated abdominal cramping/pain at onset Onset of bleeding on 12/14/2022 Last dose of Eliquis a.m. 12/14/2022  Hemoglobin 12.1> 10.5 on admit> 9.5 this a.m.  CT yesterday did not show any evidence of diverticulitis or colonic wall thickening, it did show the area of tethering of the sigmoid colon to the posterior bladder noted a small foci of gas within the bladder which could be consistent with a small colovesicular fistula. CT also raise this question fall 2023 Patient has not had any dysuria , materia or pneumaturia   She has been hemodynamically stable and has not required blood transfusion  #2 anemia acute secondary to GI blood loss #3 history of DVT/PE 2023-on chronic Eliquis #4 chronic respiratory failure/bronchiectasis, O2 dependent #5 chronic kidney disease stage III #6.  History of congestive heart failure #7.  Rheumatoid arthritis #8.  Adult onset diabetes mellitus #9.  Hypertension    PLAN: Full liquid diet today Continue to hold Eliquis Trend hemoglobin every 6-8 hours and transfuse as indicated.  This was discussed with the patient Her bleeding is slowing, she does not need CTA at present, if she has recurrent active bleeding, and drop in hemoglobin then could pursue CTA, and possible embolization  For now will  manage supportively  She is a poor surgical candidate, and as she does not appear to be symptomatic from the possible tiny  colovesicular fistula I am not certain this warrants any further workup at present but will discuss.  GI will follow with you   Amy Esterwood PA-C 12/16/2022, 11:59 AM  GI ATTENDING  History, laboratories, x-rays, prior endoscopy report all reviewed personally.  Patient personally seen and examined.  No family in room.  Agree with comprehensive consultation note as outlined above.  The patient appears to have acute lower GI bleeding on the basis of diverticular disease.  Decrease in hemoglobin is noted.  Anticoagulation being held.  Hemodynamically stable.  Recent colonoscopy as noted.  Agree with ongoing supportive measures.  Clear liquid diet.  I agree that the question of possible tiny colovesicular fistula (which is asymptomatic) does not require any further attention at this time.  Will follow.  Wilhemina Bonito. Eda Keys., M.D. [facility] Division of Gastroenterology

## 2022-12-16 NOTE — TOC Initial Note (Addendum)
Transition of Care [facility]) - Initial/Assessment Note    Patient Details  Name: [name] MRN: [id number] Date of Birth: [birth year]  Transition of [facility]) CM/SW Contact:    Howell Rucks, RN Phone Number: 12/16/2022, 11:11 AM  Clinical Narrative:  Met with pt at bedside to introduce role of TOC/NCM and review for dc planning. Pt reports she has PCP and pharmacy in place, no home care services. Home DME: walker for home use and wheelchair for community uses. Pt reports she uses home 02 through [employer], reports she has portable 02 but reports it is heavy, requesting NCM to assist with obtaining a lighter portable 02. NCM will outreach [employer] to inquire. Pt reports her son or dtr will provide transportation at discharge. TOC will continue to follow.    -12:08pm Call to [facility] with Patsy Lager, confirmed pt on service, reports pt has appropriate portable 02 to manage her current 02 liter requirements, reports there is a smaller portable 02 but would no suffice for pt's 02 requirements. NCM discussed with pt at bedside, pt verbalized understanding, reports she will manage with the portable 02 she has. Pt had no further questions/concerns.                 Expected Discharge Plan: Home/Self Care Barriers to Discharge: Continued Medical Work up   Patient Goals and CMS Choice Patient states their goals for this hospitalization and ongoing recovery are:: Return home with family support          Expected Discharge Plan and Services       Living arrangements for the past 2 months: Single Family Home                                      Prior Living Arrangements/Services Living arrangements for the past 2 months: Single Family Home Lives with:: Spouse, Adult Children Patient language and need for interpreter reviewed:: Yes Do you feel safe going back to the place where you live?: Yes      Need for Family Participation in Patient Care: Yes (Comment) Care giver support system  in place?: Yes (comment) Current home services: DME (walker, wheelchair, Home 02 through [employer]) Criminal Activity/Legal Involvement Pertinent to Current Situation/Hospitalization: No - Comment as needed  Activities of Daily Living Home Assistive Devices/Equipment: None ADL Screening (condition at time of admission) Patient's cognitive ability adequate to safely complete daily activities?: Yes Is the patient deaf or have difficulty hearing?: No Does the patient have difficulty seeing, even when wearing glasses/contacts?: No Does the patient have difficulty concentrating, remembering, or making decisions?: No Patient able to express need for assistance with ADLs?: Yes Does the patient have difficulty dressing or bathing?: No Independently performs ADLs?: Yes (appropriate for developmental age) Does the patient have difficulty walking or climbing stairs?: No Weakness of Legs: Both Weakness of Arms/Hands: None  Permission Sought/Granted Permission sought to share information with : Case Manager Permission granted to share information with : Yes, Verbal Permission Granted  Share Information with NAME: Fannie Knee, RN           Emotional Assessment Appearance:: Appears stated age Attitude/Demeanor/Rapport: Gracious Affect (typically observed): Accepting Orientation: : Oriented to Self, Oriented to Place, Oriented to  Time, Oriented to Situation Alcohol / Substance Use: Not Applicable Psych Involvement: No (comment)  Admission diagnosis:  Rectal bleeding [K62.5] BRBPR (bright red blood per rectum) [K62.5] Lower GI bleed [K92.2]  Patient Active Problem List   Diagnosis Date Noted   Lower GI bleed 12/16/2022   BRBPR (bright red blood per rectum) 12/15/2022   [name] 09/08/2022   Abnormal CT scan, sigmoid colon 04/14/2022   Colon stricture (HCC) 04/14/2022   Diverticulosis of colon without hemorrhage 04/14/2022   Pulmonary embolism (HCC)  11/15/2021   Tachycardia 10/22/2021   Pulmonary HTN (HCC)    Unspecified protein-calorie malnutrition (HCC) 04/05/2021   Pressure injury of skin 03/31/2021   UTI (urinary tract infection) 03/31/2021   Acute encephalopathy 03/30/2021   Thrombocytopenia (HCC) 03/30/2021   Diabetic neuropathy (HCC) 05/24/2019   Hypokalemia 04/21/2019   Anticoagulated 06/25/2018   Chronic pain 06/25/2018   CRI (chronic renal insufficiency), stage 3 (moderate) (HCC) 06/25/2018   Drug-induced constipation 06/18/2018   DVT (deep venous thrombosis) (HCC) 06/03/2018   Primary osteoarthritis of both knees 05/26/2018   Body mass index (BMI) of 50-59.9 in adult (HCC) 05/26/2018   Acquired hypothyroidism 05/26/2018   Chronic bilateral low back pain without sciatica 02/17/2018   Astigmatism with presbyopia, bilateral 03/26/2017   Cortical age-related cataract of both eyes 03/26/2017   Family history of glaucoma 03/26/2017   Long term current use of oral hypoglycemic drug 03/26/2017   Nuclear sclerotic cataract of both eyes 03/26/2017   Pain in left hip 02/18/2017   Closed nondisplaced intertrochanteric fracture of left femur with delayed healing 01/14/2017   Gout 11/29/2016   Depression 11/29/2016   Closed left hip fracture (HCC) 11/29/2016   GERD (gastroesophageal reflux disease) 11/29/2016   Closed intertrochanteric fracture of hip, left, initial encounter (HCC) 11/29/2016   Physical deconditioning 07/31/2015   Pulmonary fibrosis, postinflammatory (HCC) 07/31/2015   Bronchiectasis without complication (HCC) 07/31/2015   Tendinitis of left rotator cuff 06/13/2015   Chronic diastolic CHF (congestive heart failure) (HCC) 04/21/2015   Acute kidney injury superimposed on chronic kidney disease (HCC) 04/17/2015   Chronic respiratory failure with hypoxia (HCC) 02/09/2015   Dyspnea and respiratory abnormality 01/03/2015   Normal coronary arteries 11/02/2014   Morbid obesity (HCC) 10/30/2014   Dysphagia  08/21/2014   Chronic fatigue 06/21/2014   DOE (dyspnea on exertion) 05/24/2014   Primary osteoarthritis of left knee 04/26/2014   High risk medication use 04/17/2014   Aphasia 02/12/2014   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) 01/18/2014   Lumbar facet arthropathy 01/18/2014   Bilateral edema of lower extremity 05/30/2013   Chronic cough 05/24/2013   ILD (interstitial lung disease) (HCC) 04/10/2013   Spinal stenosis of lumbar region with radiculopathy 07/12/2012   Inability to walk 07/12/2012   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 [place] [address] [place] [address] Phone: [phone] Fax: [phone]  [id number] - [place], [place] - [address] AT [facility] [address]  [place] [address] Phone: [phone] Fax: [phone]  [id number] [place], [place] - [address] AT [facility] OF [address] [place] [address] Phone: [phone] Fax: [phone]     Social Determinants of Health (  SDOH) Social History: SDOH Screenings   Food Insecurity: No Food Insecurity (12/16/2022)  Housing: Patient Declined (12/16/2022)  Transportation Needs: No Transportation Needs (12/16/2022)  Utilities: At Risk (12/16/2022)  Alcohol Screen: Low Risk  (10/27/2022)  Depression (PHQ2-9): Low Risk  (11/27/2022)  Recent Concern: Depression (PHQ2-9) - Medium Risk (09/03/2022)  Financial Resource Strain: Low Risk  (10/27/2022)  Physical Activity: Insufficiently Active (10/27/2022)  Social Connections: Socially Integrated  (10/27/2022)  Stress: No Stress Concern Present (10/27/2022)  Tobacco Use: Low Risk  (12/15/2022)   SDOH Interventions:     Readmission Risk Interventions    12/16/2022   11:09 AM  Readmission Risk Prevention Plan  Transportation Screening Complete  PCP or Specialist Appt within 5-7 Days Complete  Home Care Screening Complete  Medication Review (RN CM) Complete

## 2022-12-16 NOTE — Progress Notes (Addendum)
Patient c/o 7/10 arthritis pain in right shoulder. She currently has a lidocaine patch in place. She usually takes morphine extended release and immediate release at home. Notified J. Garner Nash, NP. Will give her morphine extended release early per NP. He also ordered a one-time dose tylenol PRN. Will give when needed and will continue to monitor.

## 2022-12-16 NOTE — Progress Notes (Addendum)
Mobility Specialist - Progress Note  (3L [place]) Pre-mobility: 89 bpm HR, BP, 95% SpO2 During mobility: 98% SpO2 Post-mobility: 94 bpm HR, BP, 98% SPO2   12/16/22 1438  Mobility  Activity Ambulated with assistance in room  Level of Assistance Minimal assist, patient does 75% or more  Assistive Device Front wheel walker  Distance Ambulated (ft) 10 ft  Range of Motion/Exercises Active Assistive  Activity Response Tolerated fair  Mobility Referral Yes  $Mobility charge 1 Mobility  Mobility Specialist Start Time (ACUTE ONLY) 1418  Mobility Specialist Stop Time (ACUTE ONLY) 1438  Mobility Specialist Time Calculation (min) (ACUTE ONLY) 20 min   Pt was found in bed and agreeable to ambulate. Pt was min-A for bed mobility. When going from STS stated being fatigued and stood for ~19min. Afterwards went 41ft and took a seated rest break for ~6min and finished ambulating to recliner chair. Was left on recliner chair with all needs met. Call bell in reach and RN in room.  Billey Chang Mobility Specialist

## 2022-12-16 NOTE — H&P (Signed)
History and Physical  [name]:[id number] DOB: [birth year] DOA: 12/15/2022  PCP: [name], MD Patient coming from: Home  I have personally briefly reviewed patient's old medical records in [facility]   Chief Complaint: Bright red blood per rectum  HPI: [name] is a 82 y.o. female past medical history of diabetes mellitus type 2 essential hypertension rheumatoid arthritis on chronic steroids, interstitial lung disease due to rheumatoid arthritis, bronchiectasis on chronic respiratory failure oxygen dependent,, chronic kidney disease stage III, chronic diastolic heart failure, history of PE and DVT on Eliquis with a last colonoscopy on 04/14/2022 stricture of the colon felt to be due to to diverticulosis although there is no stricture a thin colonoscope had to be used, and diverticulosis, her last hemoglobin was 12.1 on 11/28/2022 who started having abdominal pain the day prior to admission and then started having abdominal pain she relates she has had about 5 bloody bowel movements over the past 2 days.  Has felt lightheaded, which has resolved some abdominal cramping in the left lower quadrant.  In the ED: Vitals are stable satting 93% on 2 L of oxygen.  Has remained afebrile with no leukocytosis. Previous hemoglobin was 12.1 on 11/28/2022, now on admission 10.5.  CT scan of the abdomen pelvis showed persistent colonic fistula diffuse diverticulosis no diverticulitis and atherosclerosis.   Review of Systems: All systems reviewed and apart from history of presenting illness, are negative.  Past Medical History:  Diagnosis Date   Acute on chronic diastolic (congestive) heart failure (HCC)    Anemia    iron deficiency anemia - secondary to blood loss ( chronic)    Anxiety    Arthritis    endstage changes bilateral knees/bilateral ankles.    Asthma    Carotid artery occlusion    Chronic fatigue    Chronic kidney disease    Closed left hip fracture  (HCC)    Clotting disorder (HCC)    pt denies this   Contusion of left knee    due to fall 1/14.   COPD (chronic obstructive pulmonary disease) (HCC)    pulmonary fibrosis   Depression, reactive    Diabetes mellitus    type II    Diastolic dysfunction    Difficulty in walking    Family history of heart disease    Generalized muscle weakness    Gout    High cholesterol    History of falling    Hypertension    Hypothyroidism    Interstitial lung disease (HCC)    Meningioma of left sphenoid wing involving cavernous sinus (HCC) 02/17/2012   Continue diplopia, left eye pain and left headaches.     Morbid obesity (HCC)    Neuromuscular disorder (HCC)    diabetic neuropathy    Normal coronary arteries    cardiac catheterization performed  10/31/14   Pneumonia    RA (rheumatoid arthritis) (HCC)    has been off methotreaxte since 10/13.   Spinal stenosis of lumbar region    Thyroid disease    Unspecified lack of coordination    URI (upper respiratory infection)    Past Surgical History:  Procedure Laterality Date   BRAIN SURGERY     Gamma knife 10/13. Needs repeat spring  '14   CARDIAC CATHETERIZATION N/A 10/31/2014   Procedure: Right/Left Heart Cath and Coronary Angiography;  Surgeon: Corky Crafts, MD;  Location: [facility] INVASIVE CV LAB;  Service: Cardiovascular;  Laterality: N/A;   COLONOSCOPY  WITH PROPOFOL N/A 04/14/2022   Procedure: COLONOSCOPY WITH PROPOFOL;  Surgeon: Beverley Fiedler, MD;  Location: [facility] ENDOSCOPY;  Service: Gastroenterology;  Laterality: N/A;   CYST EXCISION     2 on face   CYST REMOVAL NECK     ESOPHAGOGASTRODUODENOSCOPY (EGD) WITH PROPOFOL N/A 09/14/2014   Procedure: ESOPHAGOGASTRODUODENOSCOPY (EGD) WITH PROPOFOL;  Surgeon: Louis Meckel, MD;  Location: [facility] ENDOSCOPY;  Service: Endoscopy;  Laterality: N/A;   INCISION AND DRAINAGE HIP Left 01/16/2017   Procedure: IRRIGATION AND DEBRIDEMENT LEFT HIP;  Surgeon: Kathryne Hitch, MD;  Location: [facility]  ORS;  Service: Orthopedics;  Laterality: Left;   INTRAMEDULLARY (IM) NAIL INTERTROCHANTERIC Left 11/29/2016   Procedure: INTRAMEDULLARY (IM) NAIL INTERTROCHANTRIC;  Surgeon: Kathryne Hitch, MD;  Location: [facility] OR;  Service: Orthopedics;  Laterality: Left;   OVARY SURGERY     RIGHT HEART CATH N/A 08/12/2021   Procedure: RIGHT HEART CATH;  Surgeon: Runell Gess, MD;  Location: [facility] INVASIVE CV LAB;  Service: Cardiovascular;  Laterality: N/A;   SHOULDER SURGERY Left    TONSILLECTOMY  age 32   VAGINAL HYSTERECTOMY     VIDEO BRONCHOSCOPY Bilateral 05/31/2013   Procedure: VIDEO BRONCHOSCOPY WITHOUT FLUORO;  Surgeon: Kalman Shan, MD;  Location: [facility] ENDOSCOPY;  Service: Cardiopulmonary;  Laterality: Bilateral;   video bronscoscopy  05/19/1998   lung   Social History:  reports that she has never smoked. She has never used smokeless tobacco. She reports that she does not drink alcohol and does not use drugs.   Allergies  Allergen Reactions   Codeine Other (See Comments) and Swelling    Facial swelling  Chest pain and swelling in legs  Other Reaction(s): Other (See Comments)  Facial swelling, Chest pain and swelling in legs, , Facial swelling, Chest pain and swelling in legs, , Facial swelling, Chest pain and swelling in legs   Infliximab Anaphylaxis    (REMICADE) "sent me into shock"  "sent me into shock", "sent me into shock", "sent me into shock"   Lisinopril Swelling and Rash    Face and neck swelling  Face and neck swelling, Face and neck swelling, Face and neck swelling   Ofev [Nintedanib] Diarrhea    Family History  Problem Relation Age of Onset   Diabetes Mother    Heart attack Mother    Hypertension Father    Lung cancer Father    Diabetes Sister    Breast cancer Sister    Breast cancer Sister    Diabetes Brother    Hypertension Brother    Heart disease Brother    Heart attack Brother    Kidney cancer Brother    Gout Brother    Kidney failure Brother         x 5   Esophageal cancer Brother    Stomach cancer Brother    Prostate cancer Brother    Uterine cancer Daughter        with mets   Fibroids Daughter    Hypertension Daughter    Hypertension Daughter    Hypertension Son    Heart murmur Son    Rheum arthritis Maternal Uncle    Alzheimer's disease Neg Hx    Dementia Neg Hx     Prior to Admission medications   Medication Sig Start Date End Date Taking? Authorizing Provider  allopurinol (ZYLOPRIM) 300 MG tablet Take 1 tablet (300 mg total) by mouth daily. 05/03/21  Yes Medina-Vargas, Monina C, NP  amLODipine (NORVASC) 10 MG tablet Take 10  mg by mouth daily. 02/12/22  Yes [provider]  apixaban (ELIQUIS) 5 MG TABS tablet Take 1 tablet (5 mg total) by mouth 2 (two) times daily. 09/02/22  Yes Runell Gess, MD  azithromycin (ZITHROMAX) 250 MG tablet Take 250 mg by mouth 3 (three) times a week. 11/23/21  Yes [provider]  Calcium Carb-Cholecalciferol (CALCIUM + D3 PO) Take 2 tablets by mouth daily with lunch.    Yes [provider]  Cholecalciferol (VITAMIN D-3) 25 MCG (1000 UT) CAPS Take 2 capsules (2,000 Units total) by mouth daily. GIVE ONE TAB PO Q DAY 05/03/21  Yes Medina-Vargas, Monina C, NP  colchicine 0.6 MG tablet Take 1 tablet (0.6 mg total) by mouth daily as needed (gout flares). 05/03/21  Yes Medina-Vargas, Monina C, NP  DULoxetine (CYMBALTA) 60 MG capsule Take 1 capsule (60 mg total) by mouth daily. 11/27/22  Yes Jones Bales, NP  fluticasone (FLONASE) 50 MCG/ACT nasal spray Place 1 spray into both nostrils daily. 11/28/22  Yes [name], MD  furosemide (LASIX) 80 MG tablet TAKE 1 TABLET(80 MG) BY MOUTH DAILY 08/14/22  Yes Runell Gess, MD  gabapentin (NEURONTIN) 600 MG tablet Take 2 tablets (1,200 mg total) by mouth 3 (three) times daily. 11/27/22  Yes Jones Bales, NP  guaiFENesin (MUCINEX) 600 MG 12 hr tablet Take 2 tablets (1,200 mg total) by mouth 2 (two) times daily.  05/03/21  Yes Medina-Vargas, Monina C, NP  hydrALAZINE (APRESOLINE) 25 MG tablet TAKE 1 TABLET(25 MG) BY MOUTH TWICE DAILY 08/21/22  Yes Runell Gess, MD  hydroxychloroquine (PLAQUENIL) 200 MG tablet Take 1 tablet (200 mg total) by mouth daily. 05/03/21  Yes Medina-Vargas, Monina C, NP  leflunomide (ARAVA) 20 MG tablet Take 1 tablet (20 mg total) by mouth daily. 05/03/21 08/09/23 Yes Medina-Vargas, Monina C, NP  levothyroxine (SYNTHROID) 50 MCG tablet Take 1 tablet (50 mcg total) by mouth daily. 11/28/22  Yes [name], MD  linaclotide [facility]) 72 MCG capsule Take 1 capsule (72 mcg total) by mouth daily before breakfast. 08/14/22  Yes [name], MD  metoprolol succinate (TOPROL-XL) 100 MG 24 hr tablet TAKE 1 TABLET BY MOUTH DAILY ALONG WITH 25 MG TABLET 12/10/22  Yes Runell Gess, MD  metoprolol succinate (TOPROL-XL) 25 MG 24 hr tablet Take 25 mg (1 tab) along with a 100 mg tab for a total daily dose of 125 mg. 11/28/22  Yes [name], MD  morphine (MS CONTIN) 15 MG 12 hr tablet Take 1 tablet (15 mg total) by mouth every 12 (twelve) hours. 11/27/22  Yes Jones Bales, NP  morphine (MSIR) 15 MG tablet Take 1 tablet (15 mg total) by mouth 2 (two) times daily. 11/27/22  Yes Jones Bales, NP  Multiple Vitamin (MULTIVITAMIN WITH MINERALS) TABS Take 1 tablet by mouth daily.    Yes [provider]  olmesartan (BENICAR) 40 MG tablet Take 1 tablet (40 mg total) by mouth daily. 11/28/22  Yes [name], MD  pantoprazole (PROTONIX) 40 MG tablet Take 1 tablet (40 mg total) by mouth 2 (two) times daily. take 1 tablet by mouth once daily MINUTES BEFORE 1ST MEAL OF THE DAY 06/26/22  Yes Nandigam, Eleonore Chiquito, MD  Potassium Chloride ER 20 MEQ TBCR Take 1 tablet (20 mEq total) by mouth daily. 11/28/22  Yes [name], MD  predniSONE (DELTASONE) 10 MG tablet Take 1 tablet (10 mg total) by mouth daily with breakfast. 05/22/21  Yes [name], MD  acetaminophen (TYLENOL)  500 MG tablet Take 500-1,000 mg by mouth every 6 (six) hours as needed for moderate pain.    [provider]  albuterol (PROVENTIL) (2.5 MG/3ML) 0.083% nebulizer solution Take 3 mLs (2.5 mg total) by nebulization every 6 (six) hours as needed for wheezing. 05/03/21   Medina-Vargas, Monina C, NP  albuterol (VENTOLIN HFA) 108 (90 Base) MCG/ACT inhaler Inhale 2 puffs into the lungs every 4 (four) hours as needed for wheezing or shortness of breath.    [provider]  Artificial Tear Ointment (DRY EYES OP) Place 1 drop into both eyes 2 (two) times daily as needed (dry eyes).    [provider]  atorvastatin (LIPITOR) 10 MG tablet Take 1 tablet (10 mg total) by mouth daily. 08/14/22   [name], MD  cyclobenzaprine (FLEXERIL) 5 MG tablet Take 1 tablet (5 mg total) by mouth at bedtime as needed for muscle spasms. 11/27/22   Jones Bales, NP  diclofenac Sodium (VOLTAREN) 1 % GEL Apply 2 g topically 3 (three) times daily. Apply to both hands and knees Patient taking differently: Apply 2 g topically 3 (three) times daily as needed (pain). Apply to both hands and knees 05/03/21   Medina-Vargas, Monina C, NP  donepezil (ARICEPT) 10 MG tablet Take 1 tablet (10 mg total) by mouth at bedtime. 09/08/22   Anson Fret, MD  etanercept (ENBREL SURECLICK) 50 MG/ML injection Inject 50 mg into the skin once a week.    [provider]  metolazone (ZAROXOLYN) 2.5 MG tablet TAKE 1 TABLET BY MOUTH DAILY AS NEEDED FOR 2 DAYS FOR WEIGHT ABOVE 255 POUNDS 05/03/21   Medina-Vargas, Monina C, NP  ondansetron (ZOFRAN-ODT) 4 MG disintegrating tablet Take 1 tablet (4 mg total) by mouth every 8 (eight) hours as needed for nausea or vomiting. 06/26/22   Napoleon Form, MD  OXYGEN Inhale 3 L into the lungs See admin instructions. 3 L at bedtime, and 3 L during the day as needed for exertion    [provider]  Semaglutide,0.25 or 0.5MG /DOS, (OZEMPIC, 0.25 OR 0.5 MG/DOSE,) 2  MG/1.5ML SOPN Inject 0.5 mg into the skin once a week. 08/19/22   [name], MD   Physical Exam: Vitals:   12/16/22 0230 12/16/22 0330 12/16/22 0416 12/16/22 0600  BP: 128/70 120/66 123/79   Pulse: 84 85 88   Resp: 12 (!) 22 20 11   Temp:   97.7 F (36.5 C)   TempSrc:   Oral   SpO2: 92% 93% 94%   Weight:      Height:        General exam: Moderately built and nourished patient, lying comfortably supine on the gurney in no obvious distress. Head, eyes and ENT: Nontraumatic and normocephalic.  Neck: Supple. No JVD, carotid bruit or thyromegaly. Lymphatics: No lymphadenopathy. Respiratory system: Clear to auscultation. No increased work of breathing. Cardiovascular system: S1 and S2 heard, RRR.  Gastrointestinal system: Positive bowel sounds nondistended soft tender in the left lower quadrant Central nervous system: Alert and oriented. No focal neurological deficits. Extremities: Symmetric 5 x 5 power. Peripheral pulses symmetrically felt.  Skin: No rashes or acute findings. Musculoskeletal system: Negative exam. Psychiatry: Pleasant and cooperative.   Labs on Admission:  Basic Metabolic Panel: Recent Labs  Lab 12/15/22 1346  NA 143  K 3.9  CL 101  CO2 34*  GLUCOSE 170*  BUN 18  CREATININE 0.85  CALCIUM 8.6*  Liver Function Tests: Recent Labs  Lab 12/15/22 1346  AST 12*  ALT 15  ALKPHOS 64  BILITOT 0.4  PROT 6.3*  ALBUMIN 4.0   Recent Labs  Lab 12/15/22 1346  LIPASE 48   No results for input(s): "AMMONIA" in the last 168 hours. CBC: Recent Labs  Lab 12/15/22 1346 12/16/22 0628  WBC 9.4  --   HGB 10.5* 9.5*  HCT 34.7* 31.6*  MCV 100.0  --   PLT 156  --    Cardiac Enzymes: No results for input(s): "CKTOTAL", "CKMB", "CKMBINDEX", "TROPONINI" in the last 168 hours.  BNP (last 3 results) No results for input(s): "PROBNP" in the last 8760 hours. CBG: Recent Labs  Lab 12/15/22 2252  GLUCAP 132*    Radiological Exams on Admission: CT  ABDOMEN PELVIS W CONTRAST  Result Date: 12/15/2022 CLINICAL DATA:  Abdominal pain, acute, nonlocalized. Pt presents with c/o rectal bleeding, started yesterday and stopped at 10 pm, starting again this morning. Reports everytime she goes to the restroom there is blood t EXAM: CT ABDOMEN AND PELVIS WITH CONTRAST TECHNIQUE: Multidetector CT imaging of the abdomen and pelvis was performed using the standard protocol following bolus administration of intravenous contrast. RADIATION DOSE REDUCTION: This exam was performed according to the departmental dose-optimization program which includes automated exposure control, adjustment of the mA and/or kV according to patient size and/or use of iterative reconstruction technique. CONTRAST:  90mL OMNIPAQUE IOHEXOL 300 MG/ML  SOLN COMPARISON:  CT abdomen pelvis 03/01/2022. FINDINGS: Lower chest: Similar-appearing reticulations.  No acute abnormality. Hepatobiliary: No focal liver abnormality. No gallstones, gallbladder wall thickening, or pericholecystic fluid. No biliary dilatation. Pancreas: No focal lesion. Normal pancreatic contour. No surrounding inflammatory changes. No main pancreatic ductal dilatation. Spleen: Normal in size without focal abnormality.  Splenule noted. Adrenals/Urinary Tract: No adrenal nodule bilaterally. Bilateral kidneys enhance symmetrically. Fluid density lesions likely represent simple renal cyst. Simple renal cysts, in the absence of clinically indicated signs/symptoms, require no independent follow-up. Subcentimeter hypodensities are too small to characterize-no further follow-up indicated. Punctate calcifications within the right kidney. No left nephrolithiasis. No ureterolithiasis bilaterally. No hydronephrosis. No hydroureter. Irregular urinary bladder wall thickening with loss of intraperitoneal fat between the loop of sigmoid colon posterior to the urinary bladder. Foci of gas within the urinary bladder lumen. On delayed imaging, there is  no urothelial wall thickening and there are no filling defects in the opacified portions of the bilateral collecting systems or ureters. Stomach/Bowel: Stomach is within normal limits. No evidence of bowel wall thickening or dilatation. Colonic diverticulosis. Under distended rectum. The appendix is not definitely identified with no inflammatory changes in the right lower quadrant to suggest acute appendicitis. Vascular/Lymphatic: No abdominal aorta or iliac aneurysm. Mild atherosclerotic plaque of the aorta and its branches. No abdominal, pelvic, or inguinal lymphadenopathy. Reproductive: Status post hysterectomy. No adnexal masses. The left ovary again appears to be tethered to the region of possible colovesicular fistula. The right ovary is unremarkable. Other: No intraperitoneal free fluid. No intraperitoneal free gas. No organized fluid collection. Musculoskeletal: No abdominal wall hernia or abnormality. No suspicious lytic or blastic osseous lesions. No acute displaced fracture. Multilevel severe degenerative changes of the spine. Multilevel intervertebral disc space vacuum phenomenon. Grade 1 anterolisthesis of L4 on L5. Partially visualized intramedullary nail fixation of the left femur. IMPRESSION: 1. 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 Correlate with urinalysis for superimposed urinary tract infection. 2. Colonic diverticulosis with no acute diverticulitis. 3.  Aortic Atherosclerosis (ICD10-I70.0). 4. Severe multilevel degenerative changes of the spine. Electronically Signed   By: Tish Frederickson M.D.   On: 12/15/2022 19:32    EKG: Independently reviewed.  None  Assessment/Plan Lower GI bleed/acute blood loss anemia In a patient with colonic diverticulosis on Eliquis her last colonoscopy was last year. Last dose of Eliquis was the day prior to admission in the afternoon. Her hemoglobin earlier this month was 12 now this morning is  9.5. She relates her lightheadedness has resolved. Will continue to monitor hemoglobin closely, transfuse if less than 7, Or symptomatic Will consult GI. Continue checking globin every 8.  History of DVT/PE: Hold Eliquis, continue to monitor heart rate and saturations.  Essential hypertension Hold antihypertensive medication except for Lasix and metoprolol. Her blood pressure is currently stable monitor and replete electrolytes as needed.  Rheumatoid arthritis (HCC) Continue steroids.  Chronic respiratory failure with hypoxia secondary to rheumatoid arthritis/ ILD (interstitial lung disease)/  Pulmonary fibrosis, postinflammatory (HCC) Currently stable continue steroids continue her on oxygen.  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) Allow clear liquid diet. Check an A1c.  Started on sliding scale insulin.  Continue CBGs ACHS.  Chronic diastolic CHF (congestive heart failure) (HCC) Last 2D echo in 2023 showed an EF of 55% with grade 1 diastolic dysfunction Continue metoprolol and Lasix, hold ARB and Norvasc as her blood pressure is around 120.  Body mass index (BMI) of 50-59.9 in adult [facility]) Noted  CRI (chronic renal insufficiency), stage 3 (moderate) (HCC) Creatinine appears to be at baseline continue to monitor.  Pulmonary HTN (HCC) Noted    DVT Prophylaxis: scd Code Status: full  Family Communication: daughter  Disposition Plan: inpatient   It is my clinical opinion that admission to INPATIENT is reasonable and necessary in this 82 y.o. female multiple comorbidities comes in with lower GI bleed in setting of Eliquis with a history of diverticulosis  Given the aforementioned, the predictability of an adverse outcome is felt to be significant. I expect that the patient will require at least 2 midnights in the hospital to treat this condition.  Marinda Elk MD [facility]   12/16/2022, 7:41 AM

## 2022-12-17 ENCOUNTER — Inpatient Hospital Stay (HOSPITAL_COMMUNITY): Payer: Medicare Other

## 2022-12-17 DIAGNOSIS — K922 Gastrointestinal hemorrhage, unspecified: Secondary | ICD-10-CM | POA: Diagnosis not present

## 2022-12-17 DIAGNOSIS — J9610 Chronic respiratory failure, unspecified whether with hypoxia or hypercapnia: Secondary | ICD-10-CM | POA: Diagnosis not present

## 2022-12-17 DIAGNOSIS — K625 Hemorrhage of anus and rectum: Secondary | ICD-10-CM | POA: Diagnosis not present

## 2022-12-17 DIAGNOSIS — D62 Acute posthemorrhagic anemia: Secondary | ICD-10-CM | POA: Diagnosis not present

## 2022-12-17 DIAGNOSIS — M79661 Pain in right lower leg: Secondary | ICD-10-CM

## 2022-12-17 DIAGNOSIS — N183 Chronic kidney disease, stage 3 unspecified: Secondary | ICD-10-CM | POA: Diagnosis not present

## 2022-12-17 DIAGNOSIS — Z6841 Body Mass Index (BMI) 40.0 and over, adult: Secondary | ICD-10-CM | POA: Diagnosis not present

## 2022-12-17 DIAGNOSIS — I5032 Chronic diastolic (congestive) heart failure: Secondary | ICD-10-CM

## 2022-12-17 DIAGNOSIS — J849 Interstitial pulmonary disease, unspecified: Secondary | ICD-10-CM

## 2022-12-17 LAB — COMPREHENSIVE METABOLIC PANEL
ALT: 16 U/L (ref 0–44)
AST: 14 U/L — ABNORMAL LOW (ref 15–41)
Albumin: 3 g/dL — ABNORMAL LOW (ref 3.5–5.0)
Alkaline Phosphatase: 61 U/L (ref 38–126)
Anion gap: 8 (ref 5–15)
BUN: 9 mg/dL (ref 8–23)
CO2: 31 mmol/L (ref 22–32)
Calcium: 8.2 mg/dL — ABNORMAL LOW (ref 8.9–10.3)
Chloride: 104 mmol/L (ref 98–111)
Creatinine, Ser: 0.66 mg/dL (ref 0.44–1.00)
GFR, Estimated: >60 mL/min (ref >60)
Glucose, Bld: 86 mg/dL (ref 70–99)
Potassium: 3.6 mmol/L (ref 3.5–5.1)
Sodium: 143 mmol/L (ref 135–145)
Total Bilirubin: 0.5 mg/dL (ref 0.3–1.2)
Total Protein: 5.4 g/dL — ABNORMAL LOW (ref 6.5–8.1)

## 2022-12-17 LAB — GLUCOSE, CAPILLARY
Glucose-Capillary: 86 mg/dL (ref 70–99)
Glucose-Capillary: 196 mg/dL — ABNORMAL HIGH (ref 70–99)
Glucose-Capillary: 154 mg/dL — ABNORMAL HIGH (ref 70–99)
Glucose-Capillary: 123 mg/dL — ABNORMAL HIGH (ref 70–99)

## 2022-12-17 LAB — CBC
HCT: 32.6 % — ABNORMAL LOW (ref 36.0–46.0)
Hemoglobin: 9.7 g/dL — ABNORMAL LOW (ref 12.0–15.0)
MCH: 30.7 pg (ref 26.0–34.0)
MCHC: 29.8 g/dL — ABNORMAL LOW (ref 30.0–36.0)
MCV: 103.2 fL — ABNORMAL HIGH (ref 80.0–100.0)
Platelets: 142 10*3/uL — ABNORMAL LOW (ref 150–400)
RBC: 3.16 MIL/uL — ABNORMAL LOW (ref 3.87–5.11)
RDW: 15.2 % (ref 11.5–15.5)
WBC: 9.3 10*3/uL (ref 4.0–10.5)
nRBC: 0 % (ref 0.0–0.2)

## 2022-12-17 MED ORDER — ACETAMINOPHEN 325 MG PO TABS
650.0000 mg | ORAL_TABLET | ORAL | Status: DC | PRN
  Administered 2022-12-17 – 2022-12-18 (×3): 650 mg via ORAL
  Filled 2022-12-17 (×3): qty 2

## 2022-12-17 MED ORDER — GUAIFENESIN ER 600 MG PO TB12
1200.0000 mg | ORAL_TABLET | Freq: Two times a day (BID) | ORAL | Status: DC
  Administered 2022-12-17 – 2022-12-19 (×5): 1200 mg via ORAL
  Filled 2022-12-17 (×5): qty 2

## 2022-12-17 MED ORDER — MORPHINE SULFATE (PF) 2 MG/ML IV SOLN
1.0000 mg | INTRAVENOUS | Status: DC | PRN
  Administered 2022-12-17 – 2022-12-19 (×3): 1 mg via INTRAVENOUS
  Filled 2022-12-17 (×3): qty 1

## 2022-12-17 NOTE — Progress Notes (Signed)
Right lower extremity venous duplex has been completed. Preliminary results can be found in CV Proc through chart review.   12/17/22 1:58 PM Olen Cordial RVT

## 2022-12-17 NOTE — Progress Notes (Addendum)
Patient ID: [name], female   DOB: [birth year], 82 y.o.   MRN: [id number]    Progress Note   Subjective   Day # 2 CC; acute lower GI bleed  Last dose Eliquis 12/14/2022  Labs this a.m.-WBC 9.3/hemoglobin 9.7 overall stable since yesterday BUN 9/creatinine 0.66  Patient has not had any further bleeding since yesterday, tolerating full liquids. Her primary complaint is of right groin pain that seems to radiate from her back, into the right groin and then down into the anterior right thigh.  She says she also gets some pressure sensation in her rectum. She is more comfortable after some IV pain medicine this morning, had not been on her usual pain regimen.  Also says her right lower extremity seems to say more swollen than the left.  She has not had any imaging of her spine over the past couple of years but in 2022 she had MRI that showed multilevel severe disc disease of the lumbosacral spine without nerve impingement at that time.    Objective   Vital signs in last 24 hours: Temp:  [97.9 F (36.6 C)-98.2 F (36.8 C)] 98.2 F (36.8 C) (07/31 0410) Pulse Rate:  [87-91] 87 (07/31 0410) Resp:  [16-18] 16 (07/31 0410) BP: (126-139)/(74-81) 139/81 (07/31 0410) SpO2:  [96 %-100 %] 96 % (07/31 0954) Last BM Date : 12/16/22 General:    Elderly African-American female in NAD Heart:  Regular rate and rhythm; no murmurs Lungs: Respirations even and unlabored, lungs CTA bilaterally Abdomen:  Soft, obese, all sounds are present-she has some tenderness to touch in the right groin and around into the right buttock, and right anterior thigh. Extremities:  Without edema. Neurologic:  Alert and oriented,  grossly normal neurologically. Psych:  Cooperative. Normal mood and affect.  Intake/Output from previous day: 07/30 0701 - 07/31 0700 In: 1220 [P.O.:1220] Out: 360 [Urine:360] Intake/Output this shift: Total I/O In: 240 [P.O.:240] Out: -   Lab Results: Recent Labs     12/15/22 1346 12/16/22 0628 12/16/22 1421 12/16/22 2212 12/17/22 0823  WBC 9.4  --   --   --  9.3  HGB 10.5*   < > 9.1* 9.0* 9.7*  HCT 34.7*   < > 30.6* 30.6* 32.6*  PLT 156  --   --   --  142*   < > = values in this interval not displayed.   BMET Recent Labs    12/15/22 1346 12/17/22 0823  NA 143 143  K 3.9 3.6  CL 101 104  CO2 34* 31  GLUCOSE 170* 86  BUN 18 9  CREATININE 0.85 0.66  CALCIUM 8.6* 8.2*   LFT Recent Labs    12/17/22 0823  PROT 5.4*  ALBUMIN 3.0*  AST 14*  ALT 16  ALKPHOS 61  BILITOT 0.5   PT/INR No results for input(s): "LABPROT", "INR" in the last 72 hours.  Studies/Results: CT ABDOMEN PELVIS W CONTRAST  Result Date: 12/15/2022 CLINICAL DATA:  Abdominal pain, acute, nonlocalized. Pt presents with c/o rectal bleeding, started yesterday and stopped at 10 pm, starting again this morning. Reports everytime she goes to the restroom there is blood t EXAM: CT ABDOMEN AND PELVIS WITH CONTRAST TECHNIQUE: Multidetector CT imaging of the abdomen and pelvis was performed using the standard protocol following bolus administration of intravenous contrast. RADIATION DOSE REDUCTION: This exam was performed according to the departmental dose-optimization program which includes automated exposure control, adjustment of the mA and/or kV according to patient size and/or use  of iterative reconstruction technique. CONTRAST:  90mL OMNIPAQUE IOHEXOL 300 MG/ML  SOLN COMPARISON:  CT abdomen pelvis 03/01/2022. FINDINGS: Lower chest: Similar-appearing reticulations.  No acute abnormality. Hepatobiliary: No focal liver abnormality. No gallstones, gallbladder wall thickening, or pericholecystic fluid. No biliary dilatation. Pancreas: No focal lesion. Normal pancreatic contour. No surrounding inflammatory changes. No main pancreatic ductal dilatation. Spleen: Normal in size without focal abnormality.  Splenule noted. Adrenals/Urinary Tract: No adrenal nodule bilaterally. Bilateral  kidneys enhance symmetrically. Fluid density lesions likely represent simple renal cyst. Simple renal cysts, in the absence of clinically indicated signs/symptoms, require no independent follow-up. Subcentimeter hypodensities are too small to characterize-no further follow-up indicated. Punctate calcifications within the right kidney. No left nephrolithiasis. No ureterolithiasis bilaterally. No hydronephrosis. No hydroureter. Irregular urinary bladder wall thickening with loss of intraperitoneal fat between the loop of sigmoid colon posterior to the urinary bladder. Foci of gas within the urinary bladder lumen. On delayed imaging, there is no urothelial wall thickening and there are no filling defects in the opacified portions of the bilateral collecting systems or ureters. Stomach/Bowel: Stomach is within normal limits. No evidence of bowel wall thickening or dilatation. Colonic diverticulosis. Under distended rectum. The appendix is not definitely identified with no inflammatory changes in the right lower quadrant to suggest acute appendicitis. Vascular/Lymphatic: No abdominal aorta or iliac aneurysm. Mild atherosclerotic plaque of the aorta and its branches. No abdominal, pelvic, or inguinal lymphadenopathy. Reproductive: Status post hysterectomy. No adnexal masses. The left ovary again appears to be tethered to the region of possible colovesicular fistula. The right ovary is unremarkable. Other: No intraperitoneal free fluid. No intraperitoneal free gas. No organized fluid collection. Musculoskeletal: No abdominal wall hernia or abnormality. No suspicious lytic or blastic osseous lesions. No acute displaced fracture. Multilevel severe degenerative changes of the spine. Multilevel intervertebral disc space vacuum phenomenon. Grade 1 anterolisthesis of L4 on L5. Partially visualized intramedullary nail fixation of the left femur. IMPRESSION: 1. Persistent concern for possible colovesicular fistula with left ovary  tethered to this region. Correlate with urinalysis for superimposed urinary tract infection. 2. Colonic diverticulosis with no acute diverticulitis. 3.  Aortic Atherosclerosis (ICD10-I70.0). 4. Severe multilevel degenerative changes of the spine. Electronically Signed   By: Tish Frederickson M.D.   On: 12/15/2022 19:32       Assessment / Plan:    #67 82 year old African-American female with acute self-limited lower GI bleed in setting of chronic Eliquis.  Patient has known significant diverticular disease and some stenosis of the sigmoid colon.  Her current presentation is very consistent with diverticular hemorrhage which has resolved  #2 possible tiny colovesicular fistula-no urinary symptoms  #3 chronic pain syndrome with history of rheumatoid arthritis, and now with increased complaints of right groin/right back and right anterior thigh pain which she has been having recently at home but worse since here  There is nothing on CT imaging of the abdomen or pelvis to explain this pain and I think this pain is not of GI etiology suspect secondary to radiculopathy from severe lumbosacral disc disease  #4 anemia secondary to acute GI bleeding #5 previous history of DVT, and separate incidence of PE 2023-on chronic Eliquis #6 bronchiectasis 7.  Chronic respiratory failure-on chronic O2 #8 chronic kidney disease stage III 9.  Adult onset diabetes mellitus 10.  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  Patient seen and examined.  Agree with interval progress note as outlined above.  Patient appears of had a self-limited diverticular bleed.  No further bleeding.  Hemoglobin stable.  Anticoagulation on hold.  Agree with advancing diet and continuing to observe for any evidence of recurrent bleeding.  If significant recurrent acute bleeding, recommend CTA with possible IR intervention to follow-up (if positive).  Otherwise, if she is doing well without bleeding and tolerating diet, would anticipate discharge in the next day or 2.  She cannot resume chronic anticoagulation therapy after 72 hours of no bleeding.  We are available as needed.  Will sign off.  Wilhemina Bonito. Eda Keys., M.D. [facility] Division of Gastroenterology

## 2022-12-17 NOTE — Progress Notes (Signed)
Mobility Specialist - Progress Note   12/17/22 0954  Oxygen Therapy  SpO2 96 %  O2 Device Nasal Cannula  O2 Flow Rate (L/min) 3 L/min  Patient Activity (if Appropriate) Ambulating  Mobility  Activity Ambulated with assistance in hallway  Level of Assistance Standby assist, set-up cues, supervision of patient - no hands on  Assistive Device Front wheel walker  Distance Ambulated (ft) 40 ft  Activity Response Tolerated well  Mobility Referral Yes  $Mobility charge 1 Mobility  Mobility Specialist Start Time (ACUTE ONLY) [id number]  Mobility Specialist Stop Time (ACUTE ONLY) 0951  Mobility Specialist Time Calculation (min) (ACUTE ONLY) 15 min   Pt received in recliner and agreeable to mobility. Distance limited due to 7/10 back & lower abdomen pain. Pt to bed after session with all needs met & MD in room.      Pre-mobility: 89 HR, 92% SpO2 (3L [place]) During mobility: 106 HR, 96% SpO2 (3L [place]) Post-mobility: 97 HR, 95% SPO2 (3L [place])  Chief Technology Officer

## 2022-12-17 NOTE — Progress Notes (Signed)
[facility]                                                                              [name], is a 82 y.o. female, DOB - Jul 29, 1940, ZOX:[id number] Admit date - 12/15/2022    Outpatient Primary MD for the patient is [name], MD  LOS - 1  days  No chief complaint on file.      Brief summary   Patient is a 82 year old female with a DM type II, HTN, rheumatoid arthritis on chronic steroids, interstitial lung disease, bronchiectasis, chronic tori failure, O2 dependent, CKD stage III, chronic diastolic CHF, history of PE and DVT on Eliquis presented with rectal bleed. Patient presented with abdominal pain that started a day before the admission and was related that she had about 5 bloody bowel movements in the past 2 days, felt lightheaded. In the ED vitals are stable satting 93% on 2 L of oxygen, afebrile with no leukocytosis. Previous hemoglobin was 12.1 on 11/28/2022, on admission 10.5.  CT scan of the abdomen pelvis showed persistent colonic fistula diffuse diverticulosis no diverticulitis and atherosclerosis.  Assessment & Plan    Principal Problem:   BRBPR (bright red blood per rectum), lower GI bleed, acute blood loss anemia, symptomatic -Last dose of Eliquis was day prior to admission, has history of chronic diverticulosis -GI consulted, no further bleeding since yesterday, tolerating full liquid diet -Likely diverticular bleeding which has resolved, H&H stable -Follow CBC in a.m. -Per GI, okay to advance to regular diet, if she does not have any further active bleeding, reasonable to resume Eliquis after 72 hours of no bleeding.  Active problems History of DVT/PE: -Currently eliquis on hold  Right groin pain, radiating from the back to the right thigh -Will obtain venous Doppler, x-ray of the right hip and lumbar spine -May need further workup with MRI if no conclusive results for the pain -CT imaging of the abdomen shows no acute  abdominal pathology.  No renal stones   Essential hypertension -BP stable, continue Lasix, Toprol-XL   Rheumatoid arthritis (HCC) Continue steroids.   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   Pulmonary HTN (HCC) -No acute issues  Obesity Estimated body mass index is 39.32 kg/m as calculated from the following:   Height as of this encounter: 5\' 4"  (1.626 m).   Weight as of this encounter: 103.9 kg.  Code Status: Full code DVT Prophylaxis:  SCDs Start: 12/16/22 0807   Level of Care: Level of care: Telemetry Family Communication:  Updated patient Disposition Plan:      Remains inpatient appropriate:      Procedures:    Consultants:   Gastroenterology  Antimicrobials:   Anti-infectives (From admission, onward)    Start     Dose/Rate Route Frequency Ordered Stop   12/17/22 1000  azithromycin (ZITHROMAX) tablet 250 mg        250 mg Oral Once per day on Monday Wednesday Friday 12/16/22 0017     12/16/22 1000  hydroxychloroquine (PLAQUENIL) tablet 200 mg        200 mg Oral Daily 12/16/22 0016            Medications  allopurinol  300 mg Oral Daily   atorvastatin  10 mg Oral Daily   azithromycin  250 mg Oral Once per day on Monday Wednesday Friday   donepezil  10 mg Oral QHS   DULoxetine  60 mg Oral Daily    furosemide  80 mg Oral Daily   gabapentin  1,200 mg Oral TID   guaiFENesin  1,200 mg Oral BID   hydroxychloroquine  200 mg Oral Daily   insulin aspart  0-5 Units Subcutaneous QHS   insulin aspart  0-9 Units Subcutaneous TID WC   leflunomide  20 mg Oral Daily   levothyroxine  50 mcg Oral Daily   lidocaine  1 patch Transdermal Q24H   linaclotide  72 mcg Oral QAC breakfast   metoprolol succinate  125 mg Oral Daily   morphine  15 mg Oral Q12H   pantoprazole  40 mg Oral BID   predniSONE  10 mg Oral Q breakfast      Subjective:   [name] was seen and examined today.  Complaining of right groin pain radiating from the back.  Patient denies dizziness, chest pain, shortness of breath, N/V/D/C, new weakness, numbess, tingling. No acute events overnight.    Objective:   Vitals:   12/16/22 2116 12/17/22 0410 12/17/22 0954 12/17/22 1159  BP: 129/74 139/81  127/75  Pulse: 91 87  86  Resp: 18 16  17   Temp: 97.9 F (36.6 C) 98.2 F (36.8 C)  97.6 F (36.4 C)  TempSrc: Oral Oral  Oral  SpO2: 100% 100% 96% 99%  Weight:      Height:        Intake/Output Summary (Last 24 hours) at 12/17/2022 1334 Last data filed at 12/17/2022 1038 Gross per 24 hour  Intake 1460 ml  Output --  Net 1460 ml     Wt Readings from Last 3 Encounters:  12/15/22 103.9 kg  11/28/22 103.9 kg  11/27/22 103.9 kg     Exam General: Alert and oriented x 3, NAD, uncomfortable Cardiovascular: S1 S2 auscultated,  RRR Respiratory: Clear to auscultation bilaterally, no wheezing Gastrointestinal: Soft, nontender, nondistended, + bowel sounds Ext: no pedal edema bilaterally Neuro: no new deficits Psych: Normal affect     Data Reviewed:  I have personally reviewed following labs    CBC Lab Results  Component Value Date   WBC 9.3 12/17/2022   RBC 3.16 (L) 12/17/2022   HGB 9.7 (L) 12/17/2022   HCT 32.6 (L) 12/17/2022   MCV 103.2 (H) 12/17/2022   MCH 30.7 12/17/2022   PLT 142 (L) 12/17/2022    MCHC 29.8 (L) 12/17/2022   RDW 15.2 12/17/2022   LYMPHSABS 2.4 11/28/2022   MONOABS 0.8 11/28/2022   EOSABS 0.2 11/28/2022   BASOSABS 0.1 11/28/2022     Last metabolic panel Lab Results  Component Value Date  NA 143 12/17/2022   K 3.6 12/17/2022   CL 104 12/17/2022   CO2 31 12/17/2022   BUN 9 12/17/2022   CREATININE 0.66 12/17/2022   GLUCOSE 86 12/17/2022   GFRNONAA >60 12/17/2022   GFRAA 49 (L) 11/10/2019   CALCIUM 8.2 (L) 12/17/2022   PHOS 2.9 04/02/2021   PROT 5.4 (L) 12/17/2022   ALBUMIN 3.0 (L) 12/17/2022   LABGLOB 1.7 09/29/2022   AGRATIO 2.2 09/29/2022   BILITOT 0.5 12/17/2022   ALKPHOS 61 12/17/2022   AST 14 (L) 12/17/2022   ALT 16 12/17/2022   ANIONGAP 8 12/17/2022    CBG (last 3)  Recent Labs    12/16/22 2113 12/17/22 0753 12/17/22 1155  GLUCAP 135* 86 196*      Coagulation Profile: No results for input(s): "INR", "PROTIME" in the last 168 hours.   Radiology Studies: I have personally reviewed the imaging studies  CT ABDOMEN PELVIS W CONTRAST  Result Date: 12/15/2022 CLINICAL DATA:  Abdominal pain, acute, nonlocalized. Pt presents with c/o rectal bleeding, started yesterday and stopped at 10 pm, starting again this morning. Reports everytime she goes to the restroom there is blood t EXAM: CT ABDOMEN AND PELVIS WITH CONTRAST TECHNIQUE: Multidetector CT imaging of the abdomen and pelvis was performed using the standard protocol following bolus administration of intravenous contrast. RADIATION DOSE REDUCTION: This exam was performed according to the departmental dose-optimization program which includes automated exposure control, adjustment of the mA and/or kV according to patient size and/or use of iterative reconstruction technique. CONTRAST:  90mL OMNIPAQUE IOHEXOL 300 MG/ML  SOLN COMPARISON:  CT abdomen pelvis 03/01/2022. FINDINGS: Lower chest: Similar-appearing reticulations.  No acute abnormality. Hepatobiliary: No focal liver abnormality. No  gallstones, gallbladder wall thickening, or pericholecystic fluid. No biliary dilatation. Pancreas: No focal lesion. Normal pancreatic contour. No surrounding inflammatory changes. No main pancreatic ductal dilatation. Spleen: Normal in size without focal abnormality.  Splenule noted. Adrenals/Urinary Tract: No adrenal nodule bilaterally. Bilateral kidneys enhance symmetrically. Fluid density lesions likely represent simple renal cyst. Simple renal cysts, in the absence of clinically indicated signs/symptoms, require no independent follow-up. Subcentimeter hypodensities are too small to characterize-no further follow-up indicated. Punctate calcifications within the right kidney. No left nephrolithiasis. No ureterolithiasis bilaterally. No hydronephrosis. No hydroureter. Irregular urinary bladder wall thickening with loss of intraperitoneal fat between the loop of sigmoid colon posterior to the urinary bladder. Foci of gas within the urinary bladder lumen. On delayed imaging, there is no urothelial wall thickening and there are no filling defects in the opacified portions of the bilateral collecting systems or ureters. Stomach/Bowel: Stomach is within normal limits. No evidence of bowel wall thickening or dilatation. Colonic diverticulosis. Under distended rectum. The appendix is not definitely identified with no inflammatory changes in the right lower quadrant to suggest acute appendicitis. Vascular/Lymphatic: No abdominal aorta or iliac aneurysm. Mild atherosclerotic plaque of the aorta and its branches. No abdominal, pelvic, or inguinal lymphadenopathy. Reproductive: Status post hysterectomy. No adnexal masses. The left ovary again appears to be tethered to the region of possible colovesicular fistula. The right ovary is unremarkable. Other: No intraperitoneal free fluid. No intraperitoneal free gas. No organized fluid collection. Musculoskeletal: No abdominal wall hernia or abnormality. No suspicious lytic or  blastic osseous lesions. No acute displaced fracture. Multilevel severe degenerative changes of the spine. Multilevel intervertebral disc space vacuum phenomenon. Grade 1 anterolisthesis of L4 on L5. Partially visualized intramedullary nail fixation of the left femur. IMPRESSION: 1. Persistent concern for possible colovesicular fistula with left  ovary tethered to this region. Correlate with urinalysis for superimposed urinary tract infection. 2. Colonic diverticulosis with no acute diverticulitis. 3.  Aortic Atherosclerosis (ICD10-I70.0). 4. Severe multilevel degenerative changes of the spine. Electronically Signed   By: Tish Frederickson M.D.   On: 12/15/2022 19:32       Thaddaeus Granja M.D. [facility] 12/17/2022, 1:34 PM  Available via Epic secure chat 7am-7pm After 7 pm, please refer to night coverage provider listed on amion.

## 2022-12-18 ENCOUNTER — Inpatient Hospital Stay (HOSPITAL_COMMUNITY): Payer: Medicare Other

## 2022-12-18 DIAGNOSIS — I2699 Other pulmonary embolism without acute cor pulmonale: Secondary | ICD-10-CM

## 2022-12-18 DIAGNOSIS — N183 Chronic kidney disease, stage 3 unspecified: Secondary | ICD-10-CM | POA: Diagnosis not present

## 2022-12-18 DIAGNOSIS — Z6841 Body Mass Index (BMI) 40.0 and over, adult: Secondary | ICD-10-CM | POA: Diagnosis not present

## 2022-12-18 DIAGNOSIS — K625 Hemorrhage of anus and rectum: Secondary | ICD-10-CM | POA: Diagnosis not present

## 2022-12-18 DIAGNOSIS — I5032 Chronic diastolic (congestive) heart failure: Secondary | ICD-10-CM | POA: Diagnosis not present

## 2022-12-18 LAB — GLUCOSE, CAPILLARY
Glucose-Capillary: 88 mg/dL (ref 70–99)
Glucose-Capillary: 119 mg/dL — ABNORMAL HIGH (ref 70–99)
Glucose-Capillary: 158 mg/dL — ABNORMAL HIGH (ref 70–99)
Glucose-Capillary: 108 mg/dL — ABNORMAL HIGH (ref 70–99)

## 2022-12-18 MED ORDER — MORPHINE SULFATE ER 15 MG PO TBCR
15.0000 mg | EXTENDED_RELEASE_TABLET | Freq: Once | ORAL | Status: AC
  Administered 2022-12-18: 15 mg via ORAL

## 2022-12-18 MED ORDER — MORPHINE SULFATE ER 30 MG PO TBCR
30.0000 mg | EXTENDED_RELEASE_TABLET | ORAL | Status: DC
  Administered 2022-12-19: 30 mg via ORAL
  Filled 2022-12-18: qty 1

## 2022-12-18 MED ORDER — LORAZEPAM 2 MG/ML IJ SOLN
0.5000 mg | Freq: Once | INTRAMUSCULAR | Status: AC
  Administered 2022-12-18: 0.5 mg via INTRAVENOUS
  Filled 2022-12-18: qty 1

## 2022-12-18 NOTE — Progress Notes (Signed)
[facility]                                                                              [name], is a 82 y.o. female, DOB - August 01, 1940, ZOX:[id number] Admit date - 12/15/2022    Outpatient Primary MD for the patient is [name], MD  LOS - 2  days  No chief complaint on file.      Brief summary   Patient is a 82 year old female with a DM type II, HTN, rheumatoid arthritis on chronic steroids, interstitial lung disease, bronchiectasis, chronic tori failure, O2 dependent, CKD stage III, chronic diastolic CHF, history of PE and DVT on Eliquis presented with rectal bleed. Patient presented with abdominal pain that started a day before the admission and was related that she had about 5 bloody bowel movements in the past 2 days, felt lightheaded. In the ED vitals are stable satting 93% on 2 L of oxygen, afebrile with no leukocytosis. Previous hemoglobin was 12.1 on 11/28/2022, on admission 10.5.  CT scan of the abdomen pelvis showed persistent colonic fistula diffuse diverticulosis no diverticulitis and atherosclerosis.  Assessment & Plan    Principal Problem:   BRBPR (bright red blood per rectum), lower GI bleed, acute blood loss anemia, symptomatic -Last dose of Eliquis was day prior to admission, has history of chronic diverticulosis -GI consulted, no further bleeding since yesterday, tolerating full liquid diet -Likely diverticular bleeding which has resolved -Per GI, okay to advance to regular diet, if she does not have any further active bleeding, reasonable to resume Eliquis after 72 hours of no bleeding. -H&H remained stable  Active problems History of DVT/PE: -Currently eliquis on hold  Right groin pain, radiating from the back to the right thigh likely sciatica pain -CT imaging of the abdomen shows no acute abdominal pathology.  No renal stones -Venous Dopplers, negative for DVT - xrays lumbar spine showed DJD, classic radicular pain from to  the groin and legs -Patient follows orthopedics outpatient, will obtain MRI of the lumbar spine.  Patient reports that she has had ESI injections in the back in the past but they do not last very long and was told that she is not a surgical candidate.    Essential hypertension -BP stable, continue Lasix, Toprol-XL   Rheumatoid arthritis (HCC) Continue steroids.   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   Pulmonary HTN (HCC) -No acute issues  Obesity Estimated body mass index is 39.32 kg/m as calculated from the following:   Height  as of this encounter: 5\' 4"  (1.626 m).   Weight as of this encounter: 103.9 kg.  Code Status: Full code DVT Prophylaxis:  SCDs Start: 12/16/22 0807   Level of Care: Level of care: Telemetry Family Communication: Updated patient Disposition Plan:      Remains inpatient appropriate:      Procedures:    Consultants:   Gastroenterology  Antimicrobials:   Anti-infectives (From admission, onward)    Start     Dose/Rate Route Frequency Ordered Stop   12/17/22 1000  azithromycin (ZITHROMAX) tablet 250 mg        250 mg Oral Once per day on Monday Wednesday Friday 12/16/22 0017     12/16/22 1000  hydroxychloroquine (PLAQUENIL) tablet 200 mg        200 mg Oral Daily 12/16/22 0016             Medications  allopurinol  300 mg Oral Daily   atorvastatin  10 mg Oral Daily   azithromycin  250 mg Oral Once per day on Monday Wednesday Friday   donepezil  10 mg Oral QHS   DULoxetine  60 mg Oral Daily   furosemide  80 mg Oral Daily   gabapentin  1,200 mg Oral TID   guaiFENesin  1,200 mg Oral BID   hydroxychloroquine  200 mg Oral Daily   insulin aspart  0-5 Units Subcutaneous QHS   insulin aspart  0-9 Units Subcutaneous TID WC   leflunomide  20 mg Oral Daily   levothyroxine  50 mcg Oral Daily   lidocaine  1 patch Transdermal Q24H   linaclotide  72 mcg Oral QAC breakfast   LORazepam  0.5 mg Intravenous Once   metoprolol succinate  125 mg Oral Daily   [START ON 12/19/2022] morphine  30 mg Oral Q24H   pantoprazole  40 mg Oral BID   predniSONE  10 mg Oral Q breakfast      Subjective:   [name] was seen and examined today.  No acute abdominal pain, any rectal bleeding.  However continues to have back pain radiating to the right groin and to the right leg no weakness or numbness or tingling or urinary incontinence.  No acute events overnight.  Objective:   Vitals:   12/17/22 2026 12/18/22 0429 12/18/22 0928 12/18/22 1245  BP: 108/70 125/76 110/71 110/68  Pulse: 88 87 93 87  Resp: 18 18  19   Temp: 98.2 F (36.8 C) 98.2 F (36.8 C)  97.8 F (36.6 C)  TempSrc: Oral Oral  Oral  SpO2: 97% 99%  99%  Weight:      Height:       No intake or output data in the 24 hours ending 12/18/22 1431    Wt Readings from Last 3 Encounters:  12/15/22 103.9 kg  11/28/22 103.9 kg  11/27/22 103.9 kg   Physical Exam General: Alert and oriented x 3, NAD Cardiovascular: S1 S2 clear, RRR.  Respiratory: CTAB, no wheezing Gastrointestinal: Soft, nontender, nondistended, NBS Ext: no pedal edema bilaterally Neuro: no new deficits Psych: Normal affect     Data Reviewed:  I have personally reviewed following labs    CBC Lab Results  Component Value Date   WBC 9.2  12/18/2022   RBC 2.94 (L) 12/18/2022   HGB 9.0 (L) 12/18/2022   HCT 30.4 (L) 12/18/2022   MCV 103.4 (H) 12/18/2022   MCH 30.6 12/18/2022   PLT 142 (L) 12/18/2022   MCHC 29.6 (L) 12/18/2022   RDW 14.9  12/18/2022   LYMPHSABS 2.4 11/28/2022   MONOABS 0.8 11/28/2022   EOSABS 0.2 11/28/2022   BASOSABS 0.1 11/28/2022     Last metabolic panel Lab Results  Component Value Date   NA 141 12/18/2022   K 3.4 (L) 12/18/2022   CL 101 12/18/2022   CO2 30 12/18/2022   BUN 11 12/18/2022   CREATININE 0.84 12/18/2022   GLUCOSE 85 12/18/2022   GFRNONAA >60 12/18/2022   GFRAA 49 (L) 11/10/2019   CALCIUM 8.0 (L) 12/18/2022   PHOS 2.9 04/02/2021   PROT 5.4 (L) 12/17/2022   ALBUMIN 3.0 (L) 12/17/2022   LABGLOB 1.7 09/29/2022   AGRATIO 2.2 09/29/2022   BILITOT 0.5 12/17/2022   ALKPHOS 61 12/17/2022   AST 14 (L) 12/17/2022   ALT 16 12/17/2022   ANIONGAP 10 12/18/2022    CBG (last 3)  Recent Labs    12/17/22 2025 12/18/22 0750 12/18/22 1153  GLUCAP 123* 88 119*      Coagulation Profile: No results for input(s): "INR", "PROTIME" in the last 168 hours.   Radiology Studies: I have personally reviewed the imaging studies  DG Lumbar Spine 2-3 Views  Result Date: 12/17/2022 CLINICAL DATA:  Pain from the lumbar spine down both legs. EXAM: LUMBAR SPINE - 2-3 VIEW COMPARISON:  CT abdomen and pelvis 12/15/2022 FINDINGS: Five lumbar type vertebral bodies. Severe degenerative changes throughout the lumbar spine with narrowed interspaces and prominent endplate osteophyte formation. Degenerative disc disease at L1-2 and L2-3 levels. Endplate sclerosis consistent with discogenic degenerative changes. Mild endplate compression deformities at L2, similar to prior study. Degenerative changes in the posterior facet joints. No focal bone lesions. Bone cortex appears intact. Visualized sacrum appears intact. Vascular calcifications. IMPRESSION: Prominent degenerative changes throughout the lumbar spine. No  acute changes seen since prior study. Electronically Signed   By: Burman Nieves M.D.   On: 12/17/2022 17:58   DG HIP UNILAT WITH PELVIS 2-3 VIEWS RIGHT  Result Date: 12/17/2022 CLINICAL DATA:  Lumbar spine and bilateral leg pain, worse on the right. EXAM: DG HIP (WITH OR WITHOUT PELVIS) 2-3V RIGHT COMPARISON:  CT abdomen and pelvis 12/15/2022 FINDINGS: Postoperative changes with internal fixation of an old inter trochanteric fracture of the proximal left femur. Fixation includes a long stem intramedullary rod, incompletely included within the field of view, and compression bold. No dislocation. Moderately prominent degenerative changes in both hips with acetabular joint space narrowing and sclerosis and prominent osteophyte formation bilaterally. SI joints and symphysis pubis are not displaced. No acute displaced fractures are identified. No focal bone lesion or bone destruction. Soft tissues are unremarkable. IMPRESSION: 1. Prominent degenerative changes in both hips. 2. No acute bony abnormalities. 3. Postoperative fixation of an old fracture of the left proximal femur. Electronically Signed   By: Burman Nieves M.D.   On: 12/17/2022 17:57   VAS [place] LOWER EXTREMITY VENOUS (DVT)  Result Date: 12/17/2022  Lower Venous DVT Study Patient Name:  [name]  Date of Exam:   12/17/2022 Medical Rec #: [id number]          Accession #:    [id number] Date of Birth: December 24, 1940          Patient Gender: F Patient Age:   44 years Exam Location:  [facility] Procedure:      VAS [place] LOWER EXTREMITY VENOUS (DVT) Referring Phys: Dollye Glasser --------------------------------------------------------------------------------  Indications: Pain.  Risk Factors: None identified. Limitations: Body habitus and poor ultrasound/tissue interface. Comparison Study: No prior studies. Performing  Technologist: Chanda Busing RVT  Examination Guidelines: A complete evaluation includes B-mode imaging, spectral Doppler, color  Doppler, and power Doppler as needed of all accessible portions of each vessel. Bilateral testing is considered an integral part of a complete examination. Limited examinations for reoccurring indications may be performed as noted. The reflux portion of the exam is performed with the patient in reverse Trendelenburg.  +---------+---------------+---------+-----------+----------+-------------------+ RIGHT    CompressibilityPhasicitySpontaneityPropertiesThrombus Aging      +---------+---------------+---------+-----------+----------+-------------------+ CFV      Full           Yes      Yes                                      +---------+---------------+---------+-----------+----------+-------------------+ SFJ      Full                                                             +---------+---------------+---------+-----------+----------+-------------------+ FV Prox  Full                                                             +---------+---------------+---------+-----------+----------+-------------------+ FV Mid   Full                                                             +---------+---------------+---------+-----------+----------+-------------------+ FV Distal               Yes      Yes                                      +---------+---------------+---------+-----------+----------+-------------------+ PFV      Full                                                             +---------+---------------+---------+-----------+----------+-------------------+ POP      Full           Yes      Yes                                      +---------+---------------+---------+-----------+----------+-------------------+ PTV      Full                                                             +---------+---------------+---------+-----------+----------+-------------------+ PERO  Not well visualized  +---------+---------------+---------+-----------+----------+-------------------+   +----+---------------+---------+-----------+----------+--------------+ LEFTCompressibilityPhasicitySpontaneityPropertiesThrombus Aging +----+---------------+---------+-----------+----------+--------------+ CFV Full           Yes      Yes                                 +----+---------------+---------+-----------+----------+--------------+    Summary: RIGHT: - There is no evidence of deep vein thrombosis in the lower extremity. However, portions of this examination were limited- see technologist comments above.  - No cystic structure found in the popliteal fossa.  LEFT: - No evidence of common femoral vein obstruction.  *See table(s) above for measurements and observations. Electronically signed by Heath Lark on 12/17/2022 at 5:20:52 PM.    Final        Thad Ranger M.D. [facility] 12/18/2022, 2:31 PM  Available via Epic secure chat 7am-7pm After 7 pm, please refer to night coverage provider listed on amion.

## 2022-12-18 NOTE — Progress Notes (Signed)
Mobility Specialist - Progress Note  Pre-mobility: 92 bpm HR, 98% SpO2 During mobility: 107 bpm HR, 97% SpO2 Post-mobility: 89 bpm HR, 96% SPO2   12/18/22 1223  Mobility  Activity Ambulated with assistance in hallway  Level of Assistance Contact guard assist, steadying assist  Assistive Device Front wheel walker  Distance Ambulated (ft) 44 ft  Range of Motion/Exercises Active Assistive  Activity Response Tolerated fair  Mobility Referral Yes  $Mobility charge 1 Mobility  Mobility Specialist Start Time (ACUTE ONLY) 1200  Mobility Specialist Stop Time (ACUTE ONLY) 1223  Mobility Specialist Time Calculation (min) (ACUTE ONLY) 23 min   Pt was found ambulating to bathroom and agreeable to ambulate in hallway afterwards. Had x1 seated rest break on recliner chair after bathroom use to catch her breath. Pt then ambulated ~52ft and had x1 seated rest break due to feeling winded and exhausted. Afterwards ambulated back to recliner chair with all needs met. Call bell in reach.  Billey Chang Mobility Specialist

## 2022-12-19 ENCOUNTER — Telehealth: Payer: Self-pay

## 2022-12-19 DIAGNOSIS — N183 Chronic kidney disease, stage 3 unspecified: Secondary | ICD-10-CM | POA: Diagnosis not present

## 2022-12-19 DIAGNOSIS — Z6841 Body Mass Index (BMI) 40.0 and over, adult: Secondary | ICD-10-CM | POA: Diagnosis not present

## 2022-12-19 DIAGNOSIS — I5032 Chronic diastolic (congestive) heart failure: Secondary | ICD-10-CM | POA: Diagnosis not present

## 2022-12-19 DIAGNOSIS — K625 Hemorrhage of anus and rectum: Secondary | ICD-10-CM | POA: Diagnosis not present

## 2022-12-19 LAB — GLUCOSE, CAPILLARY
Glucose-Capillary: 88 mg/dL (ref 70–99)
Glucose-Capillary: 133 mg/dL — ABNORMAL HIGH (ref 70–99)

## 2022-12-19 MED ORDER — LIDOCAINE 5 % EX PTCH: 1.0000 | MEDICATED_PATCH | CUTANEOUS | 0 refills | Status: DC

## 2022-12-19 NOTE — Plan of Care (Signed)
  Problem: Education: Goal: Knowledge of General Education information will improve Description Including pain rating scale, medication(s)/side effects and non-pharmacologic comfort measures Outcome: Progressing   Problem: Health Behavior/Discharge Planning: Goal: Ability to manage health-related needs will improve Outcome: Progressing   

## 2022-12-19 NOTE — TOC Transition Note (Signed)
Transition of Care [facility]) - CM/SW Discharge Note   Patient Details  Name: [name] MRN: [id number] Date of Birth: 10-11-1940  Transition of Care [facility]) CM/SW Contact:  Howell Rucks, RN Phone Number: 12/19/2022, 2:03 PM   Clinical Narrative:  [facility] PT/OT w/ Frances Furbish rep-Cory. No further TOC needs identified.      Final next level of care: Home w Home Health Services Barriers to Discharge: Barriers Resolved   Patient Goals and CMS Choice CMS Medicare.gov Compare Post Acute Care list provided to:: Patient Choice offered to / list presented to : Patient  Discharge Placement                         Discharge Plan and Services Additional resources added to the After Visit Summary for                            [facility] Arranged: PT, OT [facility] Agency: [facility] Date [facility] Agency Contacted: 12/19/22 Time HH Agency Contacted: [phone] Representative spoke with at [facility] Agency: Kandee Keen  Social Determinants of Health (SDOH) Interventions SDOH Screenings   Food Insecurity: No Food Insecurity (12/16/2022)  Housing: Patient Declined (12/16/2022)  Transportation Needs: No Transportation Needs (12/16/2022)  Utilities: Not At Risk (12/17/2022)  Recent Concern: Utilities - At Risk (12/16/2022)  Alcohol Screen: Low Risk  (10/27/2022)  Depression (PHQ2-9): Low Risk  (11/27/2022)  Recent Concern: Depression (PHQ2-9) - Medium Risk (09/03/2022)  Financial Resource Strain: Low Risk  (10/27/2022)  Physical Activity: Insufficiently Active (10/27/2022)  Social Connections: Socially Integrated (10/27/2022)  Stress: No Stress Concern Present (10/27/2022)  Tobacco Use: Low Risk  (12/15/2022)     Readmission Risk Interventions    12/16/2022   11:09 AM  Readmission Risk Prevention Plan  Transportation Screening Complete  PCP or Specialist Appt within 5-7 Days Complete  Home Care Screening Complete  Medication Review (RN CM) Complete

## 2022-12-19 NOTE — Discharge Summary (Signed)
Physician Discharge Summary   Patient: [name] MRN: [id number] DOB: [birth year]  Admit date:     12/15/2022  Discharge date: 12/19/22  Discharge Physician: Thad Ranger, MD    PCP: [name], MD   Recommendations at discharge:   Patient can resume eliquis Follow-up outpatient with Dr. 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)   Chronic diastolic CHF (congestive heart failure) (HCC)   Pulmonary fibrosis, postinflammatory (HCC)   Body mass index (BMI) of 50-59.9 in adult [facility])   CRI (chronic renal insufficiency), stage 3 (moderate) (HCC)   Pulmonary HTN (HCC)   Pulmonary embolism [facility])      Hospital Course:  Patient is a 82 year old female with a DM type II, HTN, rheumatoid arthritis on chronic steroids, interstitial lung disease, bronchiectasis, chronic tori failure, O2 dependent, CKD stage III, chronic diastolic CHF, history of PE and DVT on Eliquis presented with rectal bleed. Patient presented with abdominal pain that started a day before the admission and was related that she had about 5 bloody bowel movements in the past 2 days, felt lightheaded. In the ED vitals are stable satting 93% on 2 L of oxygen, afebrile with no leukocytosis. Previous hemoglobin was 12.1 on 11/28/2022, on admission 10.5.  CT scan of the abdomen pelvis showed persistent colonic fistula diffuse diverticulosis no diverticulitis and atherosclerosis.   Assessment and Plan:   BRBPR (bright red blood per rectum), lower GI bleed, acute blood loss anemia, symptomatic -Last dose of Eliquis was day prior to admission, has history of chronic diverticulosis -GI was consulted, no further bleeding,  seen by Dr. Marina Goodell -Likely diverticular bleeding which has resolved -Per GI, okay to advance to regular diet, if she does not have any further active bleeding, reasonable to resume Eliquis after 72 hours of no bleeding. -H&H remained stable -Patient to resume Eliquis at discharge   Active problems History of DVT/PE: -resume Eliquis at discharge   Right groin pain, radiating from the back to the right thigh likely sciatica pain -CT imaging of the abdomen shows no acute abdominal pathology.  No renal stones -Venous Dopplers, negative for DVT - xrays lumbar spine showed DJD, classic radicular pain from to the groin and legs -MRI of the lumbar spine showed progressive dextroscoliosis with advanced multilevel lumbar spondylosis and facet arthrosis resulting in moderate to severe diffuse spinal stenosis at L1-L2 through L4-L5.  Multiple right-sided dispositions at L2-3 through L4-L5 which could contribute to right-sided symptoms - I called Ortho care office, patient receives care from Dr. Alvester Morin, unable to give [facility] injection inpatient.  I was assured that patient will be seen next week for follow-up and plan for [facility] outpatient.  -Continue pain control, PT OT.  Home health PT arranged by TOC   Essential hypertension -BP stable, continue Lasix, Toprol-XL   Rheumatoid arthritis (HCC) Continue steroids.   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   Pulmonary HTN (HCC) -No acute issues   Obesity Estimated body mass index is 39.32 kg/m as calculated from the following:   Height as of this encounter: 5\' 4"  (1.626 m).   Weight as of this encounter: 103.9 kg.     Pain control - [employer] Controlled Substance Reporting System database was reviewed. and patient was instructed, not to drive, operate heavy machinery, perform activities at heights, swimming or participation in water activities or provide baby-sitting services while on Pain, Sleep and Anxiety Medications; until their outpatient Physician has advised to do so again. Also recommended to not to take more than prescribed Pain, Sleep and Anxiety Medications.  Consultants: GI, Procedures performed: None Disposition: Home Diet recommendation:  Discharge Diet Orders (From admission, onward)     Start     Ordered   12/19/22 0000  Diet - low sodium heart healthy        12/19/22 1123           Carb modified diet DISCHARGE MEDICATION: Allergies as of 12/19/2022       Reactions   Codeine Swelling, Other (See Comments)   Facial swelling, Chest pain, and swelling in legs   Infliximab Anaphylaxis   (REMICADE) "sent me into shock"   Lisinopril Swelling, Rash   Face and neck swelling   Ofev [nintedanib] Diarrhea        Medication List     STOP taking these medications    amLODipine 10 MG tablet Commonly known as: NORVASC       TAKE these medications    acetaminophen 500 MG tablet Commonly known as: TYLENOL Take 1,000 mg by mouth as needed for moderate pain.   albuterol 108 (90 Base) MCG/ACT inhaler Commonly known as: VENTOLIN HFA Inhale 2 puffs into the lungs as needed for wheezing or shortness of breath. What changed: Another medication with the same name was changed. Make sure you understand how and when to take each.   albuterol (2.5 MG/3ML) 0.083% nebulizer solution Commonly known as: PROVENTIL Take 3 mLs (2.5 mg total) by nebulization  every 6 (six) hours as needed for wheezing. What changed:  when to take this reasons to take this   allopurinol 300 MG tablet Commonly known as: ZYLOPRIM Take 1 tablet (300 mg total) by mouth daily.   apixaban 5 MG Tabs tablet Commonly known as: ELIQUIS Take 1 tablet (5 mg total) by mouth 2 (two) times daily.   atorvastatin 10 MG tablet Commonly known as: LIPITOR Take 1 tablet (10 mg total) by mouth daily. What changed: when to take this   azithromycin 250 MG tablet Commonly known as: ZITHROMAX Take 250 mg by mouth 3 (three) times a week. Monday, Wednesday, and Friday   BIOFREEZE ROLL-ON EX Apply 1 Application topically as needed (pain).   CALCIUM + D3 PO Take 2 tablets by mouth daily.   colchicine 0.6 MG tablet Take 1 tablet (0.6 mg total) by mouth daily as needed (gout flares).   cyclobenzaprine 5 MG tablet Commonly known as: FLEXERIL Take 1 tablet (5 mg total) by mouth at bedtime as needed for muscle spasms.   diclofenac Sodium 1 % Gel Commonly known as: VOLTAREN Apply 2 g topically 3 (three) times daily. Apply to both hands and knees What changed:  how much  to take when to take this additional instructions   donepezil 10 MG tablet Commonly known as: ARICEPT Take 1 tablet (10 mg total) by mouth at bedtime.   DRY EYES OP Place 2 drops into both eyes as needed (dry eyes).   DULoxetine 60 MG capsule Commonly known as: CYMBALTA Take 1 capsule (60 mg total) by mouth daily.   Enbrel SureClick 50 MG/ML injection Generic drug: etanercept Inject 50 mg into the skin once a week.   fluticasone 50 MCG/ACT nasal spray Commonly known as: FLONASE Place 1 spray into both nostrils daily. What changed: how much to take   furosemide 80 MG tablet Commonly known as: LASIX TAKE 1 TABLET(80 MG) BY MOUTH DAILY   gabapentin 600 MG tablet Commonly known as: NEURONTIN Take 2 tablets (1,200 mg total) by mouth 3 (three) times daily.   guaiFENesin 600 MG 12 hr  tablet Commonly known as: MUCINEX Take 2 tablets (1,200 mg total) by mouth 2 (two) times daily.   hydrALAZINE 25 MG tablet Commonly known as: APRESOLINE TAKE 1 TABLET(25 MG) BY MOUTH TWICE DAILY   hydroxychloroquine 200 MG tablet Commonly known as: PLAQUENIL Take 1 tablet (200 mg total) by mouth daily.   leflunomide 20 MG tablet Commonly known as: ARAVA Take 1 tablet (20 mg total) by mouth daily.   levothyroxine 50 MCG tablet Commonly known as: SYNTHROID Take 1 tablet (50 mcg total) by mouth daily.   lidocaine 5 % Commonly known as: LIDODERM Place 1 patch onto the skin daily. Remove & Discard patch within 12 hours or as directed by MD   linaclotide 72 MCG capsule Commonly known as: Linzess Take 1 capsule (72 mcg total) by mouth daily before breakfast.   metolazone 2.5 MG tablet Commonly known as: ZAROXOLYN TAKE 1 TABLET BY MOUTH DAILY AS NEEDED FOR 2 DAYS FOR WEIGHT ABOVE 255 POUNDS   metoprolol succinate 25 MG 24 hr tablet Commonly known as: TOPROL-XL Take 25 mg (1 tab) along with a 100 mg tab for a total daily dose of 125 mg. What changed:  how much to take how to take this when to take this additional instructions   metoprolol succinate 100 MG 24 hr tablet Commonly known as: TOPROL-XL TAKE 1 TABLET BY MOUTH DAILY ALONG WITH 25 MG TABLET What changed:  how much to take how to take this when to take this additional instructions   morphine 15 MG 12 hr tablet Commonly known as: MS CONTIN Take 1 tablet (15 mg total) by mouth every 12 (twelve) hours. What changed: Another medication with the same name was changed. Make sure you understand how and when to take each.   morphine 15 MG tablet Commonly known as: MSIR Take 1 tablet (15 mg total) by mouth 2 (two) times daily. What changed:  when to take this reasons to take this   multivitamin with minerals Tabs tablet Take 1 tablet by mouth daily.   olmesartan 40 MG tablet Commonly known as: BENICAR Take 1  tablet (40 mg total) by mouth daily.   ondansetron 4 MG disintegrating tablet Commonly known as: ZOFRAN-ODT Take 1 tablet (4 mg total) by mouth every 8 (eight) hours as needed for nausea or vomiting.   OXYGEN Inhale 3 L into the lungs See admin instructions. 3 L at bedtime, and 3 L during the day as needed for exertion   Ozempic (0.25 or 0.5 MG/DOSE) 2 MG/1.5ML Sopn Generic drug: Semaglutide(0.25 or 0.5MG /DOS) Inject 0.5 mg into the skin once a week.  pantoprazole 40 MG tablet Commonly known as: PROTONIX Take 1 tablet (40 mg total) by mouth 2 (two) times daily. take 1 tablet by mouth once daily MINUTES BEFORE 1ST MEAL OF THE DAY   Potassium Chloride ER 20 MEQ Tbcr Take 1 tablet (20 mEq total) by mouth daily.   predniSONE 10 MG tablet Commonly known as: DELTASONE Take 1 tablet (10 mg total) by mouth daily with breakfast.   SALONPAS EX Apply 1 patch topically as needed (shoulder and leg pain).   Vitamin D3 50 MCG (2000 UT) capsule Take 2,000 Units by mouth daily.        Follow-up Information     [name], MD. Schedule an appointment as soon as possible for a visit in 2 week(s).   Specialty: Family Medicine Why: for hospital follow-up Contact information: [address] [place] Flake [facility] [place] [address] [phone]         Tyrell Antonio, MD Follow up in 1 week(s).   Specialty: Physical Medicine and Rehabilitation Why: for hospital follow-up Contact information: [address] [place] [address] [phone]         Care, [facility] Follow up.   Specialty: Home Health Services Why: Home Health Physical Therapy and Occupational Therapy Contact information: [address] [place] [address] [phone]         Napoleon Form, MD. Schedule an appointment as soon as possible for a visit in 3 week(s).   Specialty: Gastroenterology Why: for hospital follow-up Contact information: [address] [place]  [address] [phone]                Discharge Exam: Ceasar Mons Weights   12/15/22 1344  Weight: 103.9 kg   S: Sitting up in the chair, feeling somewhat better today.  No acute complaints.  No chest pain or shortness of breath.  No bleeding  BP 128/77 (BP Location: Left Arm)   Pulse 91   Temp 97.9 F (36.6 C) (Oral)   Resp 16   Ht 5\' 4"  (1.626 m)   Wt 103.9 kg   SpO2 97%   BMI 39.32 kg/m   Physical Exam General: Alert and oriented x 3, NAD Cardiovascular: S1 S2 clear, RRR.  Respiratory: CTAB, no wheezing, rales or rhonchi Gastrointestinal: Soft, nontender, nondistended, NBS Ext: no pedal edema bilaterally Psych: Normal affect    Condition at discharge: fair  The results of significant diagnostics from this hospitalization (including imaging, microbiology, ancillary and laboratory) are listed below for reference.   Imaging Studies: MR LUMBAR SPINE WO CONTRAST  Result Date: 12/19/2022 CLINICAL DATA:  Initial evaluation for low back pain, spondyloarthropathy. Right-sided radicular symptoms. EXAM: MRI LUMBAR SPINE WITHOUT CONTRAST TECHNIQUE: Multiplanar, multisequence MR imaging of the lumbar spine was performed. No intravenous contrast was administered. COMPARISON:  Prior radiograph from 12/17/2022 and MRI from 12/27/2019. FINDINGS: Segmentation: Standard. Lowest well-formed disc space labeled the L5-S1 level. Alignment: Moderate to severe dextroscoliosis. 2 mm anterolisthesis of L1 on L2, 4 mm retrolisthesis of L2 on L3, 2 mm anterolisthesis of L3 on L4, 4 mm anterolisthesis of L4 on L5, with 2 mm anterolisthesis of L5 on S1. Findings chronic and degenerative. Vertebrae: Mild chronic height loss at the L2 vertebral body. Vertebral body height otherwise maintained with no acute or recent fracture. Underlying bone marrow signal intensity within normal limits. There is question of a new T1 hypointense lesion measuring approximately 3 cm within the right aspect of the L2 vertebral  body (series 11, image  5). Question mildly increased STIR signal intensity at this level. Finding is nonspecific. No other worrisome osseous lesions. Mild reactive edema about the left L1-2 and right L4-5 facets due to facet arthritis. Mild reactive endplate change present about the L1-2 and L2-3 interspaces. No other abnormal marrow edema. Conus medullaris and cauda equina: Conus extends to the T12-L1 level. Conus medullaris within normal limits. Proximal nerve roots of the cauda equina are irregular due to distal stenosis. Paraspinal and other soft tissues: Paraspinous soft tissues demonstrate no acute finding. Chronic atrophy noted about the posterior paraspinous musculature, as well as the left greater than right psoas musculature. Few scattered T2 hyperintense cyst noted about the kidneys. These are better evaluated on recent abdominal CT where these are benign in appearance, no follow-up imaging recommended. Disc levels: T12-L1: Diffuse disc bulge with reactive endplate spurring. Disc bulging slightly asymmetric to the left with left paracentral annular fissure. Mild facet hypertrophy. Resultant mild narrowing of the left lateral recess. Central canal remains patent. No significant foraminal stenosis. L1-2: Degenerative intervertebral disc space narrowing with diffuse disc bulge. Reactive endplate spurring, worse on the left. Superimposed left subarticular to foraminal disc extrusion with superior migration (series 12, image 7). Superimposed mild to moderate facet hypertrophy with ligament flavum thickening. Resultant severe spinal stenosis. Severe left L1 foraminal narrowing. Right neural foramen remains patent. L2-3: Degenerative disc space narrowing with diffuse disc bulge and disc desiccation. Superimposed left subarticular to foraminal disc extrusion with superior migration (series 12, image 12). A separate right foraminal disc extrusion with slight superior migration present as well (series 9, image 8).  Moderate bilateral facet arthrosis. Resultant moderate to severe canal with bilateral subarticular stenosis. Severe bilateral L2 foraminal narrowing. L3-4: Diffuse disc bulge, asymmetric to the right. Associated reactive endplate spurring, worse on the right. Superimposed right foraminal to extraforaminal disc protrusion contacts the exiting right L3 nerve root (series 12, image 19). Moderate facet hypertrophy. Resultant severe spinal stenosis. Moderate right worse than left L3 foraminal narrowing. L4-5: Anterolisthesis. Broad base right foraminal to extraforaminal disc protrusion with slight superior migration (series 12, image 25). Severe bilateral facet arthrosis. Resultant severe spinal stenosis. Moderate right worse than left L4 foraminal narrowing. L5-S1: Disc desiccation without significant disc bulge. Small left foraminal disc protrusion with annular fissure (series 9, image 12). Severe right with moderate left facet arthrosis with ankylosis on the right. No significant spinal stenosis. Foramina remain patent. IMPRESSION: 1. Progressive dextroscoliosis with advanced multilevel lumbar spondylosis and facet arthrosis, resulting in moderate to severe diffuse spinal stenosis at L1-2 through L4-5 as above. Specific note is made of multiple right-sided disc protrusions at L2-3 through L4-5, which could contribute to right-sided symptoms. 2. Multifactorial degenerative changes with resultant severe left L1 and bilateral L2 foraminal stenosis, with moderate right worse than left L3 and L4 foraminal narrowing. 3. Question approximate 3 cm T1 hypointense lesion within the right aspect of the L2 vertebral body. Finding is not entirely certain given the advanced degenerative changes through this region. Further evaluation with dedicated postcontrast imaging of the lumbar spine recommended. Additionally, correlation with dedicated PET CT could be performed for further evaluation as warranted. Electronically Signed   By:  Rise Mu M.D.   On: 12/19/2022 03:29   DG Lumbar Spine 2-3 Views  Result Date: 12/17/2022 CLINICAL DATA:  Pain from the lumbar spine down both legs. EXAM: LUMBAR SPINE - 2-3 VIEW COMPARISON:  CT abdomen and pelvis 12/15/2022 FINDINGS: Five lumbar type vertebral bodies. Severe degenerative changes throughout the  lumbar spine with narrowed interspaces and prominent endplate osteophyte formation. Degenerative disc disease at L1-2 and L2-3 levels. Endplate sclerosis consistent with discogenic degenerative changes. Mild endplate compression deformities at L2, similar to prior study. Degenerative changes in the posterior facet joints. No focal bone lesions. Bone cortex appears intact. Visualized sacrum appears intact. Vascular calcifications. IMPRESSION: Prominent degenerative changes throughout the lumbar spine. No acute changes seen since prior study. Electronically Signed   By: Burman Nieves M.D.   On: 12/17/2022 17:58   DG HIP UNILAT WITH PELVIS 2-3 VIEWS RIGHT  Result Date: 12/17/2022 CLINICAL DATA:  Lumbar spine and bilateral leg pain, worse on the right. EXAM: DG HIP (WITH OR WITHOUT PELVIS) 2-3V RIGHT COMPARISON:  CT abdomen and pelvis 12/15/2022 FINDINGS: Postoperative changes with internal fixation of an old inter trochanteric fracture of the proximal left femur. Fixation includes a long stem intramedullary rod, incompletely included within the field of view, and compression bold. No dislocation. Moderately prominent degenerative changes in both hips with acetabular joint space narrowing and sclerosis and prominent osteophyte formation bilaterally. SI joints and symphysis pubis are not displaced. No acute displaced fractures are identified. No focal bone lesion or bone destruction. Soft tissues are unremarkable. IMPRESSION: 1. Prominent degenerative changes in both hips. 2. No acute bony abnormalities. 3. Postoperative fixation of an old fracture of the left proximal femur. Electronically  Signed   By: Burman Nieves M.D.   On: 12/17/2022 17:57   VAS [place] LOWER EXTREMITY VENOUS (DVT)  Result Date: 12/17/2022  Lower Venous DVT Study Patient Name:  BRENNLEY CURTICE  Date of Exam:   12/17/2022 Medical Rec #: [id number]          Accession #:    [id number] Date of Birth: Oct 05, 1940          Patient Gender: F Patient Age:   82 years Exam Location:  [facility] Procedure:      VAS [place] LOWER EXTREMITY VENOUS (DVT) Referring Phys:   --------------------------------------------------------------------------------  Indications: Pain.  Risk Factors: None identified. Limitations: Body habitus and poor ultrasound/tissue interface. Comparison Study: No prior studies. Performing Technologist: Chanda Busing RVT  Examination Guidelines: A complete evaluation includes B-mode imaging, spectral Doppler, color Doppler, and power Doppler as needed of all accessible portions of each vessel. Bilateral testing is considered an integral part of a complete examination. Limited examinations for reoccurring indications may be performed as noted. The reflux portion of the exam is performed with the patient in reverse Trendelenburg.  +---------+---------------+---------+-----------+----------+-------------------+ RIGHT    CompressibilityPhasicitySpontaneityPropertiesThrombus Aging      +---------+---------------+---------+-----------+----------+-------------------+ CFV      Full           Yes      Yes                                      +---------+---------------+---------+-----------+----------+-------------------+ SFJ      Full                                                             +---------+---------------+---------+-----------+----------+-------------------+ FV Prox  Full                                                             +---------+---------------+---------+-----------+----------+-------------------+  FV Mid   Full                                                              +---------+---------------+---------+-----------+----------+-------------------+ FV Distal               Yes      Yes                                      +---------+---------------+---------+-----------+----------+-------------------+ PFV      Full                                                             +---------+---------------+---------+-----------+----------+-------------------+ POP      Full           Yes      Yes                                      +---------+---------------+---------+-----------+----------+-------------------+ PTV      Full                                                             +---------+---------------+---------+-----------+----------+-------------------+ PERO                                                  Not well visualized +---------+---------------+---------+-----------+----------+-------------------+   +----+---------------+---------+-----------+----------+--------------+ LEFTCompressibilityPhasicitySpontaneityPropertiesThrombus Aging +----+---------------+---------+-----------+----------+--------------+ CFV Full           Yes      Yes                                 +----+---------------+---------+-----------+----------+--------------+    Summary: RIGHT: - There is no evidence of deep vein thrombosis in the lower extremity. However, portions of this examination were limited- see technologist comments above.  - No cystic structure found in the popliteal fossa.  LEFT: - No evidence of common femoral vein obstruction.  *See table(s) above for measurements and observations. Electronically signed by Heath Lark on 12/17/2022 at 5:20:52 PM.    Final    CT ABDOMEN PELVIS W CONTRAST  Result Date: 12/15/2022 CLINICAL DATA:  Abdominal pain, acute, nonlocalized. Pt presents with c/o rectal bleeding, started yesterday and stopped at 10 pm, starting again this morning. Reports everytime she goes to the restroom  there is blood t EXAM: CT ABDOMEN AND PELVIS WITH CONTRAST TECHNIQUE: Multidetector CT imaging of the abdomen and pelvis was performed using the standard protocol following bolus administration of intravenous contrast. RADIATION DOSE REDUCTION: This exam was performed according to the departmental dose-optimization program which includes  automated exposure control, adjustment of the mA and/or kV according to patient size and/or use of iterative reconstruction technique. CONTRAST:  90mL OMNIPAQUE IOHEXOL 300 MG/ML  SOLN COMPARISON:  CT abdomen pelvis 03/01/2022. FINDINGS: Lower chest: Similar-appearing reticulations.  No acute abnormality. Hepatobiliary: No focal liver abnormality. No gallstones, gallbladder wall thickening, or pericholecystic fluid. No biliary dilatation. Pancreas: No focal lesion. Normal pancreatic contour. No surrounding inflammatory changes. No main pancreatic ductal dilatation. Spleen: Normal in size without focal abnormality.  Splenule noted. Adrenals/Urinary Tract: No adrenal nodule bilaterally. Bilateral kidneys enhance symmetrically. Fluid density lesions likely represent simple renal cyst. Simple renal cysts, in the absence of clinically indicated signs/symptoms, require no independent follow-up. Subcentimeter hypodensities are too small to characterize-no further follow-up indicated. Punctate calcifications within the right kidney. No left nephrolithiasis. No ureterolithiasis bilaterally. No hydronephrosis. No hydroureter. Irregular urinary bladder wall thickening with loss of intraperitoneal fat between the loop of sigmoid colon posterior to the urinary bladder. Foci of gas within the urinary bladder lumen. On delayed imaging, there is no urothelial wall thickening and there are no filling defects in the opacified portions of the bilateral collecting systems or ureters. Stomach/Bowel: Stomach is within normal limits. No evidence of bowel wall thickening or dilatation. Colonic  diverticulosis. Under distended rectum. The appendix is not definitely identified with no inflammatory changes in the right lower quadrant to suggest acute appendicitis. Vascular/Lymphatic: No abdominal aorta or iliac aneurysm. Mild atherosclerotic plaque of the aorta and its branches. No abdominal, pelvic, or inguinal lymphadenopathy. Reproductive: Status post hysterectomy. No adnexal masses. The left ovary again appears to be tethered to the region of possible colovesicular fistula. The right ovary is unremarkable. Other: No intraperitoneal free fluid. No intraperitoneal free gas. No organized fluid collection. Musculoskeletal: No abdominal wall hernia or abnormality. No suspicious lytic or blastic osseous lesions. No acute displaced fracture. Multilevel severe degenerative changes of the spine. Multilevel intervertebral disc space vacuum phenomenon. Grade 1 anterolisthesis of L4 on L5. Partially visualized intramedullary nail fixation of the left femur. IMPRESSION: 1. 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 Correlate with urinalysis for superimposed urinary tract infection. 2. Colonic diverticulosis with no acute diverticulitis. 3.  Aortic Atherosclerosis (ICD10-I70.0). 4. Severe multilevel degenerative changes of the spine. Electronically Signed   By: Tish Frederickson M.D.   On: 12/15/2022 19:32    Microbiology: Results for orders placed or performed during the hospital encounter of 02/01/22  SARS Coronavirus 2 by RT PCR (hospital order, performed in [facility] hospital lab) *cepheid single result test* Anterior Nasal Swab     Status: None   Collection Time: 02/01/22  5:13 PM   Specimen: Anterior Nasal Swab  Result Value Ref Range Status   SARS Coronavirus 2 by RT PCR NEGATIVE NEGATIVE Final    Comment: (NOTE) SARS-CoV-2 target nucleic acids are NOT DETECTED.  The SARS-CoV-2 RNA is generally detectable in upper and lower respiratory specimens  during the acute phase of infection. The lowest concentration of SARS-CoV-2 viral copies this assay can detect is 250 copies / mL. A negative result does not preclude SARS-CoV-2 infection and should not be used as the sole basis for treatment or other patient management decisions.  A negative result may occur with improper specimen collection / handling, submission of specimen other than nasopharyngeal swab, presence of viral mutation(s) within the areas targeted by this assay, and inadequate number of viral copies (<250 copies / mL). A negative result must be combined with clinical observations, patient  history, and epidemiological information.  Fact Sheet for Patients:   RoadLapTop.co.za  Fact Sheet for Healthcare Providers: http://kim-miller.com/  This test is not yet approved or  cleared by the [place] FDA and has been authorized for detection and/or diagnosis of SARS-CoV-2 by FDA under an Emergency Use Authorization (EUA).  This EUA will remain in effect (meaning this test can be used) for the duration of the COVID-19 declaration under Section 564(b)(1) of the Act, 21 U.S.C. section 360bbb-3(b)(1), unless the authorization is terminated or revoked sooner.  Performed at [facility], [address] [place], [place] [address]    *Note: Due to a large number of results and/or encounters for the requested time period, some results have not been displayed. A complete set of results can be found in Results Review.    Labs: CBC: Recent Labs  Lab 12/15/22 1346 12/16/22 0628 12/16/22 1421 12/16/22 2212 12/17/22 0823 12/18/22 0354  WBC 9.4  --   --   --  9.3 9.2  HGB 10.5* 9.5* 9.1* 9.0* 9.7* 9.0*  HCT 34.7* 31.6* 30.6* 30.6* 32.6* 30.4*  MCV 100.0  --   --   --  103.2* 103.4*  PLT 156  --   --   --  142* 142*   Basic Metabolic Panel: Recent Labs  Lab 12/15/22 1346 12/17/22 0823 12/18/22 0354  NA  143 143 141  K 3.9 3.6 3.4*  CL 101 104 101  CO2 34* 31 30  GLUCOSE 170* 86 85  BUN 18 9 11   CREATININE 0.85 0.66 0.84  CALCIUM 8.6* 8.2* 8.0*   Liver Function Tests: Recent Labs  Lab 12/15/22 1346 12/17/22 0823  AST 12* 14*  ALT 15 16  ALKPHOS 64 61  BILITOT 0.4 0.5  PROT 6.3* 5.4*  ALBUMIN 4.0 3.0*   CBG: Recent Labs  Lab 12/18/22 1153 12/18/22 1638 12/18/22 2135 12/19/22 0756 12/19/22 1138  GLUCAP 119* 158* 108* 88 133*    Discharge time spent: greater than 30 minutes.  Signed: Thad Ranger, MD [facility] 12/19/2022

## 2022-12-19 NOTE — Plan of Care (Signed)
  Problem: Coping: Goal: Ability to adjust to condition or change in health will improve Outcome: Adequate for Discharge   Problem: Fluid Volume: Goal: Ability to maintain a balanced intake and output will improve Outcome: Adequate for Discharge   Problem: Health Behavior/Discharge Planning: Goal: Ability to identify and utilize available resources and services will improve Outcome: Adequate for Discharge Goal: Ability to manage health-related needs will improve Outcome: Adequate for Discharge   Problem: Metabolic: Goal: Ability to maintain appropriate glucose levels will improve Outcome: Adequate for Discharge   Problem: Nutritional: Goal: Maintenance of adequate nutrition will improve Outcome: Adequate for Discharge Goal: Progress toward achieving an optimal weight will improve Outcome: Adequate for Discharge   Problem: Skin Integrity: Goal: Risk for impaired skin integrity will decrease Outcome: Adequate for Discharge   Problem: Tissue Perfusion: Goal: Adequacy of tissue perfusion will improve Outcome: Adequate for Discharge   Problem: Education: Goal: Knowledge of General Education information will improve Description: Including pain rating scale, medication(s)/side effects and non-pharmacologic comfort measures 12/19/2022 1404 by Val Eagle, RN Outcome: Adequate for Discharge 12/19/2022 1403 by Val Eagle, RN Outcome: Progressing   Problem: Health Behavior/Discharge Planning: Goal: Ability to manage health-related needs will improve 12/19/2022 1404 by Val Eagle, RN Outcome: Adequate for Discharge 12/19/2022 1403 by Val Eagle, RN Outcome: Progressing   Problem: Clinical Measurements: Goal: Ability to maintain clinical measurements within normal limits will improve Outcome: Adequate for Discharge Goal: Will remain free from infection Outcome: Adequate for Discharge Goal: Diagnostic test results will improve Outcome: Adequate for  Discharge Goal: Respiratory complications will improve Outcome: Adequate for Discharge Goal: Cardiovascular complication will be avoided Outcome: Adequate for Discharge   Problem: Activity: Goal: Risk for activity intolerance will decrease Outcome: Adequate for Discharge   Problem: Nutrition: Goal: Adequate nutrition will be maintained Outcome: Adequate for Discharge   Problem: Coping: Goal: Level of anxiety will decrease Outcome: Adequate for Discharge   Problem: Elimination: Goal: Will not experience complications related to bowel motility Outcome: Adequate for Discharge Goal: Will not experience complications related to urinary retention Outcome: Adequate for Discharge   Problem: Pain Managment: Goal: General experience of comfort will improve Outcome: Adequate for Discharge   Problem: Safety: Goal: Ability to remain free from injury will improve Outcome: Adequate for Discharge   Problem: Skin Integrity: Goal: Risk for impaired skin integrity will decrease Outcome: Adequate for Discharge   Problem: Acute Rehab PT Goals(only PT should resolve) Goal: Pt Will Go Supine/Side To Sit Outcome: Adequate for Discharge Goal: Patient Will Transfer Sit To/From Stand Outcome: Adequate for Discharge Goal: Pt Will Ambulate Outcome: Adequate for Discharge Goal: Pt/caregiver will Perform Home Exercise Program Outcome: Adequate for Discharge

## 2022-12-19 NOTE — Evaluation (Signed)
Physical Therapy Evaluation Patient Details Name: [name] MRN: [id number] DOB: Oct 28, 1940 Today's Date: 12/19/2022  History of Present Illness  Patient is 82 y.o. female presented with abdominal pain that started a day PTA and was related that she had about 5 bloody bowel movements in the past 2 days, felt lightheaded. CT scan of the abdomen pelvis showed persistent colonic fistula diffuse diverticulosis. PMH significant for CHF, anemia, anxiety, asthma, COPD, CKD, DM, depression, gout, HTN, hypothyroidism, RA.   Clinical Impression  [name] is 82 y.o. female admitted with above HPI and diagnosis. Patient is currently limited by functional impairments below (see PT problem list). Patient lives with spouse and children and is mod independent with rollator  at baseline. Patient was able to ambulated ~40' and 25' with seated rest following due to fatigue. Pt also able to complete chair<>BSC transfer with no AD with guard/supervision for safety. Patient will benefit from continued skilled PT interventions to address impairments and progress independence with mobility. Acute PT will follow and progress as able.         If plan is discharge home, recommend the following: A little help with walking and/or transfers;A little help with bathing/dressing/bathroom;Assistance with cooking/housework;Help with stairs or ramp for entrance;Assist for transportation   Can travel by private vehicle        Equipment Recommendations None recommended by PT  Recommendations for Other Services       Functional Status Assessment Patient has had a recent decline in their functional status and demonstrates the ability to make significant improvements in function in a reasonable and predictable amount of time.     Precautions / Restrictions Precautions Precautions: Fall Restrictions Weight Bearing Restrictions: No      Mobility  Bed Mobility               General bed mobility  comments: pt OOB in recliner    Transfers Overall transfer level: Needs assistance Equipment used: Rolling walker (2 wheels), None Transfers: Sit to/from Stand, Bed to chair/wheelchair/BSC Sit to Stand: Supervision   Step pivot transfers: Supervision       General transfer comment: pt using bil UE's for power up from recliner and BSC. pt reaching back to control lowering. Pt steady with no AD to complete chair<>BSC transfer.    Ambulation/Gait Ambulation/Gait assistance: Min guard, Supervision Gait Distance (Feet): 40 Feet (40, 25) Assistive device: Rolling walker (2 wheels) Gait Pattern/deviations: Step-through pattern, Decreased step length - right, Decreased step length - left, Decreased stride length, Shuffle, Wide base of support, Trunk flexed Gait velocity: decr     General Gait Details: min guard at start fading to supervision with RW, pt's posture flexed and cues to activation hip extensors to improve provided intermittently.  Stairs            Wheelchair Mobility     Tilt Bed    Modified Rankin (Stroke Patients Only)       Balance Overall balance assessment: Needs assistance Sitting-balance support: Feet supported Sitting balance-Leahy Scale: Good     Standing balance support: Reliant on assistive device for balance, During functional activity, Bilateral upper extremity supported Standing balance-Leahy Scale: Fair Standing balance comment: pt able to maintain balance standing withouut UE support for pericare.                             Pertinent Vitals/Pain Pain Assessment Pain Assessment: Faces Faces Pain Scale: Hurts even more Pain  Location: back and Rt leg Pain Descriptors / Indicators: Discomfort, Aching Pain Intervention(s): Limited activity within patient's tolerance, Monitored during session, Repositioned    Home Living Family/patient expects to be discharged to:: Private residence Living Arrangements: Spouse/significant  other;Children Available Help at Discharge: Family Type of Home: House Home Access: Level entry       Home Layout: One level Home Equipment: Rollator (4 wheels);Wheelchair - Forensic psychologist (2 wheels);Shower seat Additional Comments: pt has an aid M-F for 3 hours each morning. can help her with bathing. pt's dtr works dayshift, son-in-law works night shift so he can assist during the afternoon.    Prior Function Prior Level of Function : Independent/Modified Independent             Mobility Comments: uses rollator in home and wheelchair out of home (family pushes her). ADLs Comments: showers independently, dresses self and has been even doing socks herself     Hand Dominance   Dominant Hand: Right (shoulder limited)    Extremity/Trunk Assessment   Upper Extremity Assessment Upper Extremity Assessment: RUE deficits/detail RUE Deficits / Details: chronic Rt shoulder pain, limited flexion at shoulder and limited ROM at wrist and digits. pt reports secondary to RA.    Lower Extremity Assessment Lower Extremity Assessment: Generalized weakness    Cervical / Trunk Assessment Cervical / Trunk Assessment: Lordotic;Other exceptions Cervical / Trunk Exceptions: habitus  Communication   Communication: No difficulties  Cognition Arousal/Alertness: Awake/alert Behavior During Therapy: WFL for tasks assessed/performed Overall Cognitive Status: Within Functional Limits for tasks assessed                                          General Comments      Exercises General Exercises - Lower Extremity Ankle Circles/Pumps: AROM, Both, 15 reps   Assessment/Plan    PT Assessment Patient needs continued PT services  PT Problem List Decreased strength;Decreased activity tolerance;Decreased balance;Decreased mobility;Decreased knowledge of use of DME;Decreased safety awareness;Decreased knowledge of precautions;Cardiopulmonary status limiting activity;Obesity        PT Treatment Interventions DME instruction;Gait training;Stair training;Functional mobility training;Therapeutic activities;Therapeutic exercise;Balance training;Neuromuscular re-education;Cognitive remediation;Patient/family education    PT Goals (Current goals can be found in the Care Plan section)  Acute Rehab PT Goals Patient Stated Goal: get home and recover strength PT Goal Formulation: With patient Time For Goal Achievement: 01/02/23 Potential to Achieve Goals: Good    Frequency Min 1X/week     Co-evaluation               AM-PAC PT "6 Clicks" Mobility  Outcome Measure Help needed turning from your back to your side while in a flat bed without using bedrails?: A Little Help needed moving from lying on your back to sitting on the side of a flat bed without using bedrails?: A Little Help needed moving to and from a bed to a chair (including a wheelchair)?: A Little Help needed standing up from a chair using your arms (e.g., wheelchair or bedside chair)?: A Little Help needed to walk in hospital room?: A Little Help needed climbing 3-5 steps with a railing? : A Lot 6 Click Score: 17    End of Session Equipment Utilized During Treatment: Gait belt;Oxygen Activity Tolerance: Patient tolerated treatment well Patient left: in chair;with call bell/phone within reach;with chair alarm set Nurse Communication: Mobility status PT Visit Diagnosis: Unsteadiness on feet (R26.81);Muscle weakness (generalized) (M62.81);Difficulty  in walking, not elsewhere classified (R26.2)    Time: 9147-8295 PT Time Calculation (min) (ACUTE ONLY): 29 min   Charges:   PT Evaluation $PT Eval Low Complexity: 1 Low PT Treatments $Gait Training: 8-22 mins PT General Charges $$ ACUTE PT VISIT: 1 Visit         Wynn Maudlin, DPT Acute Rehabilitation Services Office [phone]  12/19/22 1:27 PM

## 2022-12-19 NOTE — Plan of Care (Signed)
  Problem: Education: Goal: Knowledge of General Education information will improve Description: Including pain rating scale, medication(s)/side effects and non-pharmacologic comfort measures Outcome: Progressing   Problem: Clinical Measurements: Goal: Ability to maintain clinical measurements within normal limits will improve Outcome: Progressing   Problem: Activity: Goal: Risk for activity intolerance will decrease Outcome: Progressing   Problem: Health Behavior/Discharge Planning: Goal: Ability to manage health-related needs will improve Outcome: Not Progressing   Problem: Pain Managment: Goal: General experience of comfort will improve Outcome: Not Progressing

## 2022-12-19 NOTE — Telephone Encounter (Signed)
Phone call to triage line from Dr. Isidoro Donning #[phone] asking for a consult on this pt at [facility] bed 1428. Admission was for GI bleed but pt was having horrible LBP pt had MRI L spine multiple right sided disc protrusions L2-3 L4-5. Pt's Eliquis is on hold and Dr. Isidoro Donning was wanting to know if someone could do an ESI for her today. Franky Macho is on practice call but this is a Dr. Alvester Morin pt. Can you please call Dr. Isidoro Donning to discuss?

## 2022-12-19 NOTE — Progress Notes (Signed)
OT Cancellation Note and Discharge  Patient Details Name: [name] MRN: [id number] DOB: January 20, 1941   Cancelled Treatment:    Reason Eval/Treat Not Completed: OT screened, no needs identified, will sign off. Received text from evaluating PT Valentino Saxon) that pt is S to Mod with toileting and self care and does not need OT services. Also CM note pt has A from spouse and other family at home.   Lindon Romp OT Acute Rehabilitation Services Office [phone]    Evette Georges 12/19/2022, 1:12 PM

## 2022-12-19 NOTE — TOC Progression Note (Signed)
Transition of Care [facility]) - Progression Note    Patient Details  Name: [name] MRN: [id number] Date of Birth: 1940-12-02  [facility] CM/SW Contact  Howell Rucks, RN Phone Number: 12/19/2022, 11:15 AM  Clinical Narrative:  PT eval pending, await recommendation.      Expected Discharge Plan: Home/Self Care Barriers to Discharge: Continued Medical Work up  Expected Discharge Plan and Services       Living arrangements for the past 2 months: Single Family Home                                       Social Determinants of Health (SDOH) Interventions SDOH Screenings   Food Insecurity: No Food Insecurity (12/16/2022)  Housing: Patient Declined (12/16/2022)  Transportation Needs: No Transportation Needs (12/16/2022)  Utilities: Not At Risk (12/17/2022)  Recent Concern: Utilities - At Risk (12/16/2022)  Alcohol Screen: Low Risk  (10/27/2022)  Depression (PHQ2-9): Low Risk  (11/27/2022)  Recent Concern: Depression (PHQ2-9) - Medium Risk (09/03/2022)  Financial Resource Strain: Low Risk  (10/27/2022)  Physical Activity: Insufficiently Active (10/27/2022)  Social Connections: Socially Integrated (10/27/2022)  Stress: No Stress Concern Present (10/27/2022)  Tobacco Use: Low Risk  (12/15/2022)    Readmission Risk Interventions    12/16/2022   11:09 AM  Readmission Risk Prevention Plan  Transportation Screening Complete  PCP or Specialist Appt within 5-7 Days Complete  Home Care Screening Complete  Medication Review (RN CM) Complete

## 2022-12-21 NOTE — Progress Notes (Unsigned)
OV 01/03/2015  Chief Complaint  Patient presents with   Follow-up    Pt c/o DOE, resolving cough that started over the weekend, chest discomfort when SOB and when lying down. Pt c/o increasing fatigue.    She has severe class 3 dyspnea with hypoxemia on account of BMI 50, diaast dsyfn, chronic pain, deconditioning and ILD due to RA on celcept/pred/bactrim   I personally last saw her in march 2-016. Since then has seen other provicers in this office and most recently in June 2016. In June 2016 admitted for acute CHF - cath showed mild pulm htn only with PA mean 24 and minimal CAD (reviewed chart of procedures June 2016). Dx as acute diast chf. Since then better. SHe had fu HRCT 11/29/14 that shows progression in ILD since march 2016 and now with UIP pattern (personally visualized image). She is finally accepting of fact that ILD is not makin cause of dyspnea and obesity is main problem. She is wondering about dc cellcept/pred/bactrim so as to minimize polypharmacy and side effect profile    OV 02/09/2015  Chief Complaint  Patient presents with   Follow-up    Pt states her breathing is doing well the past few days. Pt recently went to [facility] ED for CP. Pt c/o prod cough with tan mucus. Pt denies CP/tightness    She has severe class 3 dyspnea with hypoxemia on account of BMI 50, diaast dsyfn, chronic pain, deconditioning and ILD due to RA. She is having progressive ILD. But given her miserable quality of life and polypharmacy and other side effects we decided to stop the CellCept and Bactrim. We also advise prednisone taper. This was all in the end of August 2016. But on 01/27/2015 she ended up in the emergency room with some respiratory difficulty. Her prednisone was bumped up. She subsequently saw her rheumatologist Dr. Algis Downs was now tapering her prednisone again. There is also some concerns of headaches and possibly temporal arteritis based on her description and apparently some kind of a biopsy  scheduled. She feels that since stopping her CellCept and Bactrim her cough is returned. Nevertheless weight and knee pain and back pain continued to be major issues for her along with polypharmacy. Her Polysorb was includes MS Contin  [name]    OV 02/21/2015  - a ACUTE VISIT  Chief Complaint  Patient presents with   Acute Visit    Pt c/o of chest tightness, productive cough with white/yellow mucus, and wheezing. Pt also c/o of feeling abdominal swelling/distention. Pt also c/o of constant fatigue. Pt states symptoms have been present x1 week.     Acute visit for this morbidly obese, rheumatoid arthritis patient with ILD who is now off CellCept  I saw her as recently as 02/09/2015. She reports that for the past 1 week she's having increased chest congestion, chest tightness, wheezing, shortness of breath, cough and yellow  sputum more than baseline. Symptoms are rated as moderate in severity. Relieved by inhaler. She has tried Mucinex for 1 week with some initial partial relief but symptoms progressed. So she made an acute visit.  She continues to stay off CellCept. She is not on prednisone 10 mg per day. She tells me Dr. Algis Downs has held her prednisone at the dose because of concerns of temporal arteritis but apparently now that his diagnoses being eliminated in the differential diagnosis. She will see Dr. Algis Downs in the next few weeks to decide on further  prednisone taper.    OV 04/16/2015  Chief Complaint  Patient presents with   Follow-up    Pt states she felt much better after completing the abx and pred. Pt states her SOB is at basline, prod cough with yellow mucus at times, pt c/o chest tightness.     Morbidly obese female with rheumatoid arthritis with interstitial lung disease UIP pattern with associated deconditioning and diastolic dysfunction  She continues to remain off CellCept. Last seen in October 2016. At that time I discharged her from the office with  doxycycline and prednisone taper. After that early November 2016 she was given another course of Doxy and prednisone burst. This did not help. Sputum culture at this time showed normal flora. She called again with the middle of November was given Levaquin and prednisone burst. She feels Levaquin helped her immensely. At this point in time she is reporting progressive dyspnea and also worsening arthritis. She wants to go back on immunomodulators again. She has discussed Imuran with Dr. Algis Downs. She is due to see Dr. Algis Downs tomorrow. She also feels that she is unable to come off prednisone. She feels at a minimum she needs 10 mg of prednisone per day. She will discuss this with Dr. D her rheumatologist tomorrow. She's also interested in a second opinion at [facility] interstitial lung disease clinic which I did discuss with her most recently. She continues to use oxygen.     OV 07/17/2015  Chief Complaint  Patient presents with   Follow-up    Pt states that she has improved. Pt c/o continued SOB with exertion, some ocugh and wheeze, and some intermittent chest tightness. Pt states that she does sleep a lot. Pt states that her heart rate remains high and that cardiology said it was due to her lungs. Pt is currently on Lasix, 80mg , but still seems to retain a lot of fluid and is not seeming to go to the bathroom as frequently.    Follow-up interstitial lung disease secondary to rheumatoid arthritis in the setting of morbid obesity, poor quality of life and heavy chronic pain opioid use and depressive symptoms  Last seen November 2016. Since then she has seen Dr. Algis Downs the rheumatology clinic. She was given instructions on Imuran which she has red but she has not decided whether she should take it or not. She subsequently has followed up with [facility] interstitial lung disease clinic. CT scan of the chest reviewed result shows possible UIP pattern 06/12/2015. She had autoimmune profile which was strongly positive for both rheumatoid  factor and cyclic citrulline peptide both markers for rheumatoid arthritis. She did see Dr. Trula Ore B on 07/02/2015. It appears that the note is not complete but according to the patient was supposed to be a multidisciplinary conference and they will get back to her. It is possible a second immunomodulators agent and is being contemplated but patient is leery of this given prior issues with opportunistic infection not otherwise specified and admissions to the hospital. In the interim she is also seen cardiology Janr 2017 Dr. Allyson Sabal who did not feel dyspnea was cardiac related. I reviewed his note  OV 10/17/2015  Chief Complaint  Patient presents with   Follow-up    Pt reports she feels improved. Pt c/o continued SOB with exertion, some wheeze and occasional cough. Pt denies CP/tightness.     Follow-up interstitial lung disease secondary to rheumatoid arthritis in the setting of morbid obesity, poor quality of life and heavy chronic pain opioid use  and depressive symptoms   Since seeing me last in February 2017. She followed up at [facility] pulmonary clinic in March 2017. I reviewed those notes. She saw Dr. Trula Ore B on 07/31/2015. Due to the presence of bronchiectasis she's been started on  azithromycin 3 times a week. She says this has helped. In addition due to physical deconditioning and obesity she is getting home physical therapy is also help. She is interested in pulmonary rehabilitation although given obesity and back pain can be challenging. She nevertheless wants to try. She is on daily prednisone 10 mg a day. She met with Dr. Algis Downs in the rheumatology clinic and has been referred now to [facility] rheumatology clinic for second opinion for a second immunomodulators. She continues to be morbidly obese and has been losing weight. She is somewhat interested in visiting with a [facility] for weight loss.   OV 02/18/2016  Chief Complaint  Patient presents with   Follow-up     Pt states she did not go to pulm rehab d/t BIL knee pain. Pt denies change in SOB, significant cough, CP/tightness and f/c/s. Pt states she is exercising at home. pt states she is doing well overall.    Problem List: 1.  Extensive bronchiectasis - cylindrical vs. Traction - has been present and not significantly changed from 2014 to 2017                       - has history of hospitalization for what sounds like pneumonia vs. Bronchiectasis flare as far back as 1999; underwent bronchoscopy which made her feel "like a new woman" in 1999 2.  Pulmonary fibrosis - basilar predominant reticular opacities that have not change from 2014 to 2017 3.  Rheumatoid arthritis: RF 959 ; CCP >300                       - has been treated with methotrexate, biologics, etc in the past - none in the past several years                       - currently on 7.5/5mg  alter day 4.  Morbid obesity with deconditioning - has had several hospitalizations in recent months; very deconditioned at present time  - [facility] oct [facility]     Followup for above  - She now follows at [facility] rheumatology and pulmonary clinics. Last seen by pulmonologist and rheumatologist at [facility] 01/28/2016. Review of the notes suggest that they have her on a slightly lower dose of prednisone with intent to taper to 5 mg per day. He also have her on hydroxychloroquine. No further disease modifying agents are being considered for rheumatoid arthritis. In terms of her interstitial lung disease and bronchiectasis she's on conservative therapy along with Activella device. This is helping and she feels better. Today she came in walking with her walker. This the happiest I have f seen her. She's not lost any weight but she learned in the [employer] lifestyle clinic. She tried to go to pulmonary rehabilitation but could not because of knee pain. She is up-to-date with pneumonia vaccine. She's had a flu shot this  season.    OV 03/18/2016  Chief Complaint  Patient presents with   Acute Visit    Pt states breathing is not that bad today. Pt states she has cogestion build up, no tightness, SOB w/o excertion. Pt states breathing  has been better since last visit except the mucus. No cough feels the need to cough but can not.      follow-up bronchiectasis and immunosppessive thrapy. Visit 3 month olow-up. Body mssindex is stll very high but she says she has lost weight. Overal well. Last few days increasd symptoms of mucus, cough and subjective unwellness. No fever or colored sputum. But she feells she needs antibiotic and prednisone  OV 06/23/2016  Chief Complaint  Patient presents with   Follow-up    Pt states her SOB has been doing well but she has been more fatigued. Pt has an occ prod cough with light yellow mucus and chest tightness when SOB - resolves with rest.    Follow-up bronchiectasis with interstitial lung disease on immunosuppressive therapy due to rheumatoid arthritis. Last visit 01/17/2016. Since then she's lost 5 pounds of weight according to history. . She is here with her daughter. No acute bronchitis symptoms. Overall stable. The main issues that she's having increased joint pain and she is only on plaquenil. No fever or colored sputum.   OV 02/16/2017  Chief Complaint  Patient presents with   Follow-up    Pt states breathing has been okay. Stated she had a URI x1 month ago which she developed while in rehab after hip fx. Occ. SOB and occ. chest tightness. Denies any cough at this time.    Follow-up bronchiectasis with interstitial lung disease on immunosuppressive therapy due to rheumatoid arthritis. Last visit was 6 months ago approximately. Since then she has continued to lose weight through dietary control. She feels better. However in the summer 2018 she fell down due to orthostasis and fractured her left hip. This wasn't complicated by MRSA infection according to her history.  She is now undergoing physical therapy. Overall she's feeling well. She is very proud of her weight loss. Dyspnea is improved. She missed [facility] appointment for ILD clinic but she will make this again. She uses oxygen with heavy exertion at physical therapy and at night.  OV 02/11/2018  Subjective:  Patient ID: Jean Rosenthal, female , DOB: June 28, 1940 , age 5 y.o. , MRN: [id number] , ADDRESS: [address] [id number] [place] [address]   02/11/2018 -   Chief Complaint  Patient presents with   Follow-up    Pt states she has been doing okay since last visit. States she has had some pain in the mid region of her chest x3 weeks and has also been having problems with fatigue. States she has had some occ coughing. Her main complaints are the pain in her chest and the exhaustion.     HPI Suann F Hoeger 82 y.o. -follow-up ILD with bronchiectasis in the setting of rheumatoid arthritis and obesity and physical deconditioning  It is almost a year since I last saw her.  Since then her daughter died in [place] with advanced sarcoma.  She is getting grief counseling from hospice.  She also has palliative care support at home.  She also in the last year has fallen and hurt her back.  She is on attending physical therapy which is helping her.  She says she is lost 30 pounds in the last 1 year.  Because of all this functional status is somewhat better but still she is extremely dyspneic.  In fact when she walked 185 feet x 3 laps on room air using a walker: She is only able to complete a lap and a half and stop because of extreme shortness of breath.  Her resting pulse ox is 98%.  Final pulse ox was 98%.  And her heart rate went from 107/min to 140/min.  She has not yet had a flu shot.  Of note she is on prednisone 15 mg/day which has helped her arthritis.  She is asking if it is okay to go back down to 10 mg/day from a lung standpoint.  She is also on a different arthritis immunomodulator which is helped her  rheumatoid arthritis pain.  She gets this treatment from [facility] rheumatology.  Pulmonary ILD medications also being managed at [facility].      OV 02/10/2019  Subjective:  Patient ID: Jean Rosenthal, female , DOB: Jun 27, 1940 , age 73 y.o. , MRN: [id number] , ADDRESS: [address] [place] [address] Jaclyn F Roanna Epley. -follow-up ILD with bronchiectasis in the setting of rheumatoid arthritis and obesity and physical deconditioning  Problem List - copy and paste from [facility] - DR Trula Ore B: 1.  Extensive bronchiectasis - cylindrical vs. Traction - has been present and not significantly changed from 2014 to 2017                       - has history of hospitalization for what sounds like pneumonia vs. Bronchiectasis flare as far back as 1999; underwent bronchoscopy which made her feel "like a new woman" in 1999 2.  Pulmonary fibrosis - basilar predominant reticular opacities that have not change from 2014 to 2017 3.  Rheumatoid arthritis: RF 959 ; CCP >300                       - has been treated with methotrexate, biologics, etc in the past - none in the past several years                       - currently on 10 mg prednisone daily 4.  Morbid obesity with deconditioning - has had several hospitalizations in recent months; very deconditioned at present time   02/10/2019 -   Chief Complaint  Patient presents with   Follow-up    Pt here for ILD follow up. Pt states her breathing is unchanged since last OV in 01/2018. Pt denies new symptoms. Pt denies CP/tightness and f/s/c.      HPI Kiarah F Jimerson 82 y.o. -returns for follow-up.  I personally not seen her in 1 year.  In the interim she is kept up with follow-up at [facility].  In January 2020 she suffered from left lower extremity DVT after having shortness of breath.  She was admitted at [facility] and started on Eliquis.  She still continues with Eliquis.  She tells me that Harriette Ohara was  considered as the cause although she does have obesity and sedentary lifestyle and venous incompetency.  It appears this was her first DVT.  She has had a CT angiogram chest in 2005 that was negative for PE and a Doppler ultrasound in 2016 and 2018 that was negative for DVT.  She says a cardiologist here locally is managing her Eliquis and plans to stop it after several months.  In terms of her rheumatoid arthritis she is now under the care of [facility] rheumatology.  They just having her on prednisone.  In July 2020 she also saw Dr. Leonard Schwartz pulmonary at [facility].  Apparently a walk test could not be performed.  She says she has lost weight.  Her main  complaint is exertional dyspnea.  She wants to do portable oxygen.  She is only on nighttime oxygen.  In the past she always gets dyspneic way before she even desaturates a little bit this is on account of her obesity and deconditioning.  Her son is here with with her today.  This the first time I am meeting him.  He tells me that her breathing at night is rough although not as bad as his dad sleep apnea.  Patient is not on CPAP.  He is also curious about qualifying oxygen with walking.  Walking desaturation test for an 85 feet x 3 laps: She basically walked only half a lap.  Resting pulse ox was 97% with a heart rate of 102.  Final pulse ox was 96% with a heart rate of 122 and she stopped because of dyspnea.  She had a most recent CT chest there is resulted below and it is stable.   IMPRESSION: CT Chest high resolution 1. Mid and lower lung zone predominant bronchiectasis, peribronchovascular ground-glass and scattered subpleural reticular densities, grossly stable from 11/29/2014. Findings are nonspecific and can be seen with nonspecific interstitial pneumonitis. Usual interstitial pneumonitis is considered less likely but respiratory motion and expiratory phase imaging make assessment difficult. 2. Coronary artery calcification.     Electronically  Signed   By: Leanna Battles M.D.   On: 11/24/2018 13:04  ROS - per HPI  Results for MALCOLM, HETZ" (MRN [id number]) as of 02/10/2019 14:47  Ref. Range 04/17/2015 18:02 04/18/2015 13:30  D-Dimer, Sharene Butters Latest Ref Range: 0.00 - 0.50 ug/mL-FEU 2.57 (H) 2.54 (H)   OV 06/21/2019  Subjective:  Patient ID: Jean Rosenthal, female , DOB: 01-Jul-1940 , age 23 y.o. , MRN: [id number] , ADDRESS: [address] [place] [address]   06/21/2019 -   Chief Complaint  Patient presents with   Follow-up    Pt states she was recently admitted to hospital at [facility] for 10 days. Since then, pt has been  having some low O2 sats and issues with SOB. Pt states she is better now than she was.   Tayna F Pannone. -follow-up ILD with bronchiectasis in the setting of rheumatoid arthritis and obesity and physical deconditioning  Problem List - copy and paste from [facility] - DR Trula Ore B: 1.  Extensive bronchiectasis - cylindrical vs. Traction - has been present and not significantly changed from 2014 to 2017                       - has history of hospitalization for what sounds like pneumonia vs. Bronchiectasis flare as far back as 1999; underwent bronchoscopy which made her feel "like a new woman" in 1999 2.  Pulmonary fibrosis - basilar predominant reticular opacities that have not change from 2014 to 2017 and in dec 2020 CT chest 3.  Rheumatoid arthritis: RF 959 ; CCP >300                       - has been treated with methotrexate, biologics, etc in the past - none in the past several years                       - currently on 10 mg prednisone daily 4.  Morbid obesity with deconditioning - has had several hospitalizations in recent months; very deconditioned at present time   HPI Alyze F Hanner 82 y.o. -admitted  approximately 2 months ago early December 2020 with Klebsiella pansensitive urinary tract infection and bilateral lower extremity cellulitis.  Discharge on 2 L nasal  cannula.  Recommendations for home physical therapy were made.  Bilateral Doppler extremities were negative in the hospital.  Her CODE STATUS is full.  She returns for routine follow-up.  Her daughter is with her.  She continues to lose weight.  She has lost around 40 to 50 pounds.  She says she is on the medication for rheumatoid arthritis I do not know which one.  She says her cellulitis was quite bad and she is suffered from it.  But nevertheless with the weight loss her hypoxemia and overall joint pain is better.  She still has significant pedal edema and erythema in her lower extremities.  She is asking for some advice with that.  Otherwise no issues.  Using 2 L nasal cannula at night.  Walks around with a walker.  Symptom score is listed b       11/22/2020- Interim hx  with NP Patient presents today for 2 week follow-up PNA. She had a CXR that showed slight increase in opacity right lung base, questionable infiltrate. Treated with Augmentin 1 tab BID x 10 days. She is feeling a great deal better. She no longer has right sided pain. She still has some wheezing and cough with yellow mucus. She is taking muciex 1,200mg  twice a day. She is using her flutter valve 3-4 times a day. She has been using nebulizer twice a day. She takes 10mg  prednisone daily. She is on Toprol XL 75mg , she has not taken this yet today. She takes Azithromycin MWF pulmonary with [facility]. She can resume this. Needs repeat CXR today.  TEST/EVENTS : High-resolution CT chest November 24, 2018 mid to lower lung bronchiectasis, groundglass reticular densities stable since 2016  OV 12/10/20 [facility]\  Interval History:  Has been doing ok until about a month ago.  Had pneumonia on the right side a month ago. Even now, sometimes she feels that she has pain in her R chest - upper front chest. Even sometimes today, it feels kind of bad.   She was doing a lot of mucus production. They she started hurting badly on the right side.  Thought she was sleeping too much. Did not pay it any mind. Went to see the doctor and she told them about the pain. They did an xray and diagnosed with pneumonia. She did not have any fever or chills - but she says that she very seldom in her life has had temp >99. She took antibiotics. Follow up xray revealed clearing. She was put on prednisone 40 mg with taper. She still gets kind of hoarse and has mucus in her upper throat. She is scheduled now for surgery for cataracts on August 9th.   She has started getting joint pain back in her knees again.   She was out of azithromycin. She has had a hard time getting this from the [place]. Spoke with triage nurse. Needs a 90 day supply.   Using Acapella. She has been using her albuterol nebulizer at home. She still has a lot of wheezing. This wheezing sometimes clears with coughing mucus, sometimes does not. Unsure if this gets better with the nebulizer. Happens more when she is laying down.   At the local pulmonologist, she needed oxygen and now has oxygen at home. Using [employer]. She is using 2L with (sometimes 3L). This is with laying down.  When walking and moving, she keeps it on 2L.   Mucus production is getting better. She is using the acapella three or four times per day. She is using the nebulizer twice per day.   Has R chest discomfort which feels a bit worse today than prior. She says that it feels like a strain and it affects her voice. When she talks, it is like it is pulling or straining something.   Has help now every day at home. Son has been on her case about that.   Is on 10 mg prednisone - and has been on this dose for about a week.   Outside echo 06/04/2018: Study Conclusions   - Left ventricle: The cavity size was normal. There was mild focal    basal hypertrophy of the septum. Systolic function was normal.    The estimated ejection fraction was in the range of 60% to 65%.    Wall motion was normal; there were no regional wall motion     abnormalities.  - Right ventricle: The cavity size was mildly dilated. Wall    thickness was normal.  - Pulmonary arteries: Systolic pressure was mildly increased.   Echo pharm stress test with contrast 12/15/2018:  NORMAL STRESS TEST. NORMAL RESTING STUDY WITH NO WALL MOTION ABNORMALITIES AT REST AND PEAK STRESS. NO DOPPLER PERFORMED FOR VALVULAR REGURGITATION NO DOPPLER PERFORMED FOR VALVULAR STENOSIS NORMAL RESTING BP - APPROPRIATE RESPONSE  Cath done on 10/31/14:  Mild CAD without significant obstructive disease. Ectasia in all three  major proximal vessels.  Mild pulmonary hypertension. Aortic saturation 87%. Pulmonary artery  saturation 60%. Cardiac output 7.6 L. Cardiac index 3.3.  Continue medical therapy for lung disease. Continue preventative therapy  for her heart disease.Addendum by Corky Crafts, MD on 10/31/2014 12:01 PM  Mild CAD without significant obstructive disease. Ectasia in all three  major proximal vessels.  Mild pulmonary hypertension. Aortic saturation 87%. Pulmonary artery  saturation 60%. Cardiac output 7.6 L. Cardiac index 3.3.  Current Functional Status:  Oxygen: uses 2-3L of oxygen at home PRN and with sleep.  Level of exertion that leads to dyspnea: Walking any distance makes her dyspneic.   Rehab: using the walker most of the time. When she goes out, she uses a wheelchair. Home PT was helpful. She had a problem getting in and out of vehicle and bed.  Laboratory Data/Test Results: Pulmonary Function Test (PFT) Latest Ref Rng & Units 07/02/2015 12/14/2017  FVC PRE L 1.78 1.58  FVC % PRE PRED % 63 57  FEV1 PRE L 1.49 1.33  FEV1 % PRE PRED % 70 65  FEV1/FVC PRE % 83.74 84.24  FEF25-75% PRE L/s 2.83 2.39  FEF25-75% % PRE PRED % 154 141  DLCO PRE ml/(min*mmHg) 11.28 11.41  DLCO % PRE PRED % 65 67   No PFTs today.  PFTs 12/14/17: FVC 1.58, 57 % predicted. FEV1 1.33, 65% predicted. FEV1/FVC 84.24. DLCO 11.41, 67% predicted. Compared to  06/2015, FVC is down 11.2%. DLCO is essentially stable.   Echocardiogram: 11/21/2015 - EF >55% with normal wall motion. Mildly enlarged RV with normal free wall. Mildly enlarged RA. Normal respiratory collapse of IVC. Peak RV pressure 41 mmHg.   EOTT today: Room air Spo2 96% at rest. HR at start of exercise was 122 / max HR was 150 during exercise = 106% predicted. Lowest SpO2 95% on room air. Walked 258 meters = 846 feet = 81% predicted. Total walk time = 5 minutes. Borg dyspnea scale =  7. Test ended early because of dyspnea, fatigue, and chest tightness.   Labs:  None today  Impression: Patient is a 82 y.o. with PMH significant for rheumatoid arthritis, bronchiectasis (with flare vs. Pneumonia in late 2016), pulmonary fibrosis (not progressed per CT from 2014 to 06/2015), morbid obesity, and deconditioning who comes to pulmonary clinic for follow up of lung disease and dyspnea. She has been maintained on an aggressive bronchiectasis regimen (Acapella, nebulizer, TIW azithromycin) and she has done well. Unfortunately, she ran out of azithromycin, and in the setting of this, she developed increased cough and sputum and was treated for pneumonia.   Today, she feels like she is recovered from the PNA, but she is not all the way back to her baseline. She has some congestion in her throat and she is concerned about pain in her chest (which is reproducible with palpation over the sternum.  She has limited mobility at home. She uses furniture / counters to get around if she is not using her walker. She does some physical therapy.   Plan - from patient instructions:  I have renewed your azithromycin - you have a year supply that will be sent to [facility] in [place], [place] [facility]). You should take this every Monday, Wednesday, Friday.   I am also going to write a prescription for a 1 week supply of DAILY azithromycin. So take it daily for 7 days, then switch to every Monday, Wednesday, Friday. The  prescription is written so that this can all be under a single prescription.   Continue to use your Acapella at least 3-4 times per day.   We are going to get another chest x ray today.   Regarding your nebulizer, we will renew albuterol order from [facility].   Plan to take your nebulizer three times per day (along with Acapella use). Once your congestion is improved, you may back off to twice per day.   Try to use your voltaren gel on your chest (nearly your sternum where it is sore) to see if this helps with your pain.   Return in about 6 months (around 06/12/2021).     OV 05/23/2021  Subjective:  Patient ID: Jean Rosenthal, female , DOB: 1941/04/14 , age 87 y.o. , MRN: [id number] , ADDRESS: [address] [place] [address] PCP [name], MD Patient Care Team: [name], MD as PCP - General (Family Medicine) Runell Gess, MD as PCP - Cardiology (Cardiology) Duffy, Vella Kohler, LCSW as Social Worker (Licensed Clinical Social Worker) Kidney, Moishe Spice, Janalyn Rouse, MD as Consulting Physician (Rheumatology) Darcella Cheshire, NP (Rheumatology) Adline Potter, MD as Referring Physician (Pulmonary Disease)  This Provider for this visit: Treatment Team:  Attending Provider: Kalman Shan, MD    05/23/2021 -   Chief Complaint  Patient presents with   Follow-up    Pt states since last OV, she has been in and out of the hospital. States she also ended up with Covid and has had problems with a lot of coughing and phlegm build-up. Pt's O2 sats had been dropping and had to bump O2 up to 3L    Lasheba F Jaggers. -follow-up ILD with bronchiectasis in the setting of rheumatoid arthritis and obesity and physical deconditioning  Problem List - copy and paste from [facility] - DR Trula Ore B: 1.  Extensive bronchiectasis - cylindrical vs. Traction - has been present and not significantly changed from 2014 to 2017                       -  has history of  hospitalization for what sounds like pneumonia vs. Bronchiectasis flare as far back as 1999; underwent bronchoscopy which made her feel "like a new woman" in 1999 2.  Pulmonary fibrosis - basilar predominant reticular opacities that have not change from 2014 to 2017 and in dec 2020 CT chest 3.  Rheumatoid arthritis: RF 959 ; CCP >300                       - has been treated with methotrexate, biologics, etc in the past - none in the past several years                       - currently on 10 mg prednisone daily 4.  Morbid obesity with deconditioning - has had several hospitalizations in recent months; very deconditioned at present time  - intentl wt loss +  HPI Rilei F Berish 82 y.o. -returns for follow-up.  I personally not seen her in 2 years almost.  In between she is followed up with nurse practitioner and also [facility].  She is here with her daughter to [place] who I am meeting for the first time.  They tell me that she has had 2 admissions for dehydration in 2022.  After that she went to [place] rehab where in November 2022 she ended up with COVID and was given antiviral.  They were unhappy with the admission to [facility] rehab.  They do not want to go there again.  Apparently after that she is now needing 3 L of oxygen while previously she is only needing 2 L of nasal cannula oxygen.  Last CT scan of the chest echocardiogram 2020 including review of the medical records at [facility] Last pulmonary function test with [place] was 2015 while at [facility] it was in 2019.  She continues to have functional disability.  She uses a walker to get around and change her clothes.  She continues to be immunosuppressed with prednisone, Plaquenil and also leflunomide for rheumatoid arthritis.  She has chronic pain and is on opioids \\CT  Chest data  No results found.   OV 07/12/2021  Subjective:  Patient ID: Jean Rosenthal, female , DOB: [birth year] , age 29 y.o. , MRN:  [id number] , ADDRESS: [address] [place] [address] PCP [name], MD Patient Care Team: [name], MD as PCP - General (Family Medicine) Runell Gess, MD as PCP - Cardiology (Cardiology) Duffy, Vella Kohler, LCSW as Social Worker (Licensed Clinical Social Worker) Kidney, Moishe Spice, Janalyn Rouse, MD as Consulting Physician (Rheumatology) Darcella Cheshire, NP (Rheumatology) Adline Potter, MD as Referring Physician (Pulmonary Disease)  This Provider for this visit: Treatment Team:  Attending Provider: Kalman Shan, MD    07/12/2021 -   Chief Complaint  Patient presents with   Follow-up    PFT performed today.   Pt states her breathing is about the same since last visit. States she is no longer coughing up as much mucus compared to last visit.    Problem List - copy and paste from [facility] - DR Trula Ore B: 1.  Extensive bronchiectasis - cylindrical vs. Traction - has been present and not significantly changed from 2014 to 2017                       - has history of hospitalization for what sounds like pneumonia vs. Bronchiectasis flare as far back  as 1999; underwent bronchoscopy which made her feel "like a new woman" in 1999 2.  Pulmonary fibrosis - basilar predominant reticular opacities that have not change from 2014 to 2017 and in dec 2020 CT chest 3.  Rheumatoid arthritis: RF 959 ; CCP >300                       - has been treated with methotrexate, biologics, etc in the past - none in the past several years                       - currently on 10 mg prednisone daily 4.  Morbid obesity with deconditioning - has had several hospitalizations in recent months; very deconditioned at present time  - intentl wt loss +  5. COvid - Nov 2022 associated with hospitalizations for dehydration   HPI Meko F Nolton 82 y.o. -returns for follow-up.  She presents with her daughter [name].  She is here to review results.  She does say that after her  COVID in 07 April 2021 her dyspnea is worse and then also oxygen uses up to 3 L.  Compared to the last visit she is stable but definitely compared to a few years ago she is more short of breath and requiring more oxygen.  She also reports that since November 2022 her right fifth finger has more lateral deviation.  In her fourth finger seems to be in a fixed abnormality.  She did try a short course prednisone with her primary care physician and it does not help.  She has rheumatology appointment pending with Dr. Lenis Noon at [facility] next month.  Her test results show that her echo is essentially normal.  No comment about pulmonary hypertension.  Her pulmonary function test shows FVC declined compared to 2019 at [facility] and also 2015 with [place].  This correlates with increased shortness of breath over the last few years increased subjective oxygen need.  She did have a high-resolution CT scan of the chest that our radiologist feels there is progression and she has UIP pattern with emerging honeycombing.  I personally visualized various CT scans of the chest.  I am not so sure about progression but I did tell her that I would put this up for multidisciplinary case conference.  We discussed care for progressive interstitial lung disease in the setting of rheumatoid arthritis.  Did discuss about registry protocol and I gave her the consent form.  She is interested in this.  Also discussed about nintedanib antifibrotic.  Did discuss about diarrhea as a side effect and GI issues.  She says she is constipated she and her daughter feel with polypharmacy that she should easily be able to handle this 1 other medication.  They are interested in this.  They are willing to wait till multidisciplinary case conference is done.  We also discussed the fact with WHO group 3 pulmonary hypertension inhaled treprostinil as an option.  She is never had a right heart cath.  I have written to Dr. Nanetta Batty her  cardiologist to consider right heart catheterization.  We will regroup in a couple of months.    MARCH 2023 ILD condrerence with Dr Dorothey Baseman  "Haliey Chelly Dombeck DOB: 08/08/1940 MRN:  [id number] Female, 82 y.o." Dr. Marchelle Gearing "   RA with ILD . Stable for years but then covid nov 2022 Rx as outpaitent. Since then o2 needs 2 -> 3L /PFT - slightly worse  FVC ? technique issue becase could not do dlco. CT reported as UIP and worse but MR not sure.   Question: is CT really worse? If so, she qualifies for registry study and ofev. "    "HRCT: 06/19/2021 HRCT: 11/24/2018 HRCT: 11/29/2014 HRCT: 07/20/2014 HRCT: 10/05/2013 HRCT: 04/08/2013" No lung biopsy    Concordant Between MDD and official read  On laest scan there is ILD and Tracheobroncholamacia and also diffuse Traction bronchiectasis without ILD. In LL there is bialteral LL HC., subpleural disease UIP that is unusual - suggestive of CTD.     Can approach as ILD     xxxxxxxxxxxxxxxx                   [facility] 08/12/21  HEMODYNAMICS:    1: Right atrial pressure-20/18 2: Right ventricular pressure-37/17, mean 22 3: Pulmonary artery pressure-43/19, mean 30 4: Pulmonary wedge pressure-A-wave 23, V wave 20, mean 22 - does not meet tyvaso criteria 5: Cardiac output-4.97 L/min with an index of 2.43 L/min/m 6: PVR-1.81 -0 does not meet tyvaso cirtiera     IMPRESSION: Ms. Marsan has moderately elevated right atrial pressures and mildly elevated pulmonary artery pressures.  Her PA saturations were 58%.  The sheath was removed and a bandage was placed over the right antecubital puncture site.  The patient left the lab in stable condition.  She will be discharged home later this morning and to follow-up with Dr. Marchelle Gearing and myself.   Nanetta Batty. MD, [facility] 08/12/2021 8:06   OV 09/13/2021  Subjective:  Patient ID: Jean Rosenthal, female , DOB: 1940-10-10 , age 88 y.o. , MRN: [id number] , ADDRESS: [address] [place]  [address] PCP [name], MD Patient Care Team: [name], MD as PCP - General (Family Medicine) Runell Gess, MD as PCP - Cardiology (Cardiology) Duffy, Vella Kohler, LCSW as Social Worker (Licensed Clinical Social Worker) Kidney, Moishe Spice, Janalyn Rouse, MD as Consulting Physician (Rheumatology) Darcella Cheshire, NP (Rheumatology) Adline Potter, MD as Referring Physician (Pulmonary Disease)  This Provider for this visit: Treatment Team:  Attending Provider: Kalman Shan, MD    09/13/2021 -   Chief Complaint  Patient presents with   Follow-up    Pt states she has been doing okay since last visit. States her breathing is about the same.   Problem List - copy and paste from [facility] - DR Trula Ore B: 1.  Extensive bronchiectasis - cylindrical vs. Traction - has been present and not significantly changed from 2014 to 2017                       - has history of hospitalization for what sounds like pneumonia vs. Bronchiectasis flare as far back as 1999; underwent bronchoscopy which made her feel "like a new woman" in 1999 2.  Pulmonary fibrosis - basilar predominant reticular opacities that have not change from 2014 to 2017 and in dec 2020 CT chest 3.  Rheumatoid arthritis: RF 959 ; CCP >300                       - has been treated with methotrexate, biologics, etc in the past - none in the past several years                       - currently on 10 mg prednisone daily 4.  Morbid obesity with deconditioning - has had several hospitalizations  in recent months; very deconditioned at present time  - intentl wt loss +  5. COvid - Nov 2022 associated with hospitalizations for dehydratio  HPI Kismet F Loudin 82 y.o. -returns for follow-up.  After the last visit we discussed the multidisciplinary case conference.  Independent radiologist concurred there was UIP and she has had slow progression.  She had right heart catheterization and there is no pulmonary  hypertension to qualify her for inhaled treprostinil based on WHO group 3.  Overall she is better from a RA perspective.  She is doing physical therapy.  No new complaints.  She is still following a strict diet she is lost some more weight.  We discussed nintedanib as an antifibrotic.  She wants to do this.  She has constipation.  Did discuss that sometimes she could have diverticulitis and abdominal . Which is very rare but have seen 1 or 2 patients in the setting of opioids do this especially with nintedanib.  She is accepting of the risk.  Discussed rare possibilities of bleeding and MI.  She is understanding.  She wants to start the medication.    OV 02/04/2022  Subjective:  Patient ID: Jean Rosenthal, female , DOB: 1940/06/22 , age 83 y.o. , MRN: [id number] , ADDRESS: [address] [place] [address] PCP [name], MD Patient Care Team: [name], MD as PCP - General (Family Medicine) Runell Gess, MD as PCP - Cardiology (Cardiology) Duffy, Vella Kohler, LCSW as Social Worker (Licensed Clinical Social Worker) [place], [facility] Pollyann Savoy, MD as Consulting Physician (Rheumatology) Darcella Cheshire, NP (Rheumatology) Adline Potter, MD as Referring Physician (Pulmonary Disease)  This Provider for this visit: Treatment Team:  Attending Provider: Kalman Shan, MD    02/04/2022 -   Chief Complaint  Patient presents with   Follow-up    Follow up after PFT. Pt fell on Sunday and she fell a week before that as well.     HPI Mylan F Flaum 82 y.o. -returns for follow-up.  She says that few days ago she had slurred speech noticed by the family members well on the telephone and later when she went to the bathroom she fell patient sustained some injuries.  She went to ER all the imaging work-up as listed below.  Everything was reassuring.  The suspecting a TIA and she has been referred to a neurologist.  Her blood work 02/02/2022  was fairly unremarkable.  She has been on nintedanib since I last saw her she is only in the low-dose 100 mg twice daily because of her polypharmacy and overall poor functional status but even with that she is having significant diarrhea and fatigue.  All her symptom scores are worse.  The son also feels that her overall general wellbeing is decline in the last few weeks.  She definitely attributes the diarrhea to the nintedanib.  She is not sure about the fatigue but she believes it could be from the nintedanib.  There is no GI discomfort other than diarrhea.    CT Chest data - HRT FEb 2023  Narrative & Impression  CLINICAL DATA:  82 year old female with history of chest tightness and shortness of breath. Prior history of COVID infection in December 2022.   EXAM: CT CHEST WITHOUT CONTRAST   TECHNIQUE: Multidetector CT imaging of the chest was performed following the standard protocol without intravenous contrast. High resolution imaging of the lungs, as well as inspiratory and expiratory imaging, was performed.  RADIATION DOSE REDUCTION: This exam was performed according to the departmental dose-optimization program which includes automated exposure control, adjustment of the mA and/or kV according to patient size and/or use of iterative reconstruction technique.   COMPARISON:  Chest CT 01/16/2021.   FINDINGS: Cardiovascular: Heart size is normal. There is no significant pericardial fluid, thickening or pericardial calcification. There is aortic atherosclerosis, as well as atherosclerosis of the great vessels of the mediastinum and the coronary arteries, including calcified atherosclerotic plaque in the left anterior descending coronary artery.   Mediastinum/Nodes: No pathologically enlarged mediastinal or hilar lymph nodes. Esophagus is unremarkable in appearance. No axillary lymphadenopathy.   Lungs/Pleura: High-resolution images demonstrate some patchy areas of  ground-glass attenuation, septal thickening, subpleural reticulation, mild cylindrical traction bronchiectasis, peripheral bronchiolectasis and a few areas of apparent developing honeycombing. These findings have a definitive craniocaudal gradient and appear minimally progressive compared to the recent prior examination. Inspiratory and expiratory imaging demonstrates some mild air trapping indicative of mild small airways disease. No acute consolidative airspace disease. No pleural effusions. No definite suspicious appearing pulmonary nodules or masses are noted.   Upper Abdomen: Unremarkable.   Musculoskeletal: There are no aggressive appearing lytic or blastic lesions noted in the visualized portions of the skeleton.   IMPRESSION: 1. The appearance of the lungs is considered diagnostic of usual interstitial pneumonia (UIP) per current ATS guidelines, demonstrating mild progression compared to prior examinations. 2. Aortic atherosclerosis, in addition to left anterior descending coronary artery disease. Please note that although the presence of coronary artery calcium documents the presence of coronary artery disease, the severity of this disease and any potential stenosis cannot be assessed on this non-gated CT examination. Assessment for potential risk factor modification, dietary therapy or pharmacologic therapy may be warranted, if clinically indicated.   Aortic Atherosclerosis (ICD10-I70.0).     Electronically Signed   By: Trudie Reed M.D.   On: 06/21/2021 07:48    OV 04/01/2022  Subjective:  Patient ID: Jean Rosenthal, female , DOB: 10-02-1940 , age 60 y.o. , MRN: [id number] , ADDRESS: [address] [place] [address] PCP Anson Fret, MD Patient Care Team: Anson Fret, MD as PCP - General (Neurology) Runell Gess, MD as PCP - Cardiology (Cardiology) Duffy, Vella Kohler, LCSW as Social Worker (Licensed Clinical Social Worker) [place],  [facility] Pollyann Savoy, MD as Consulting Physician (Rheumatology) Darcella Cheshire, NP (Rheumatology) Adline Potter, MD as Referring Physician (Pulmonary Disease)  This Provider for this visit: Treatment Team:  Attending Provider: Kalman Shan, MD    04/01/2022 -   Chief Complaint  Patient presents with   Follow-up    Pt states she has been doing okay since last visit.     HPI Mikenzie F Shanley 82 y.o. -after seeing me mid September 2023 we stopped nintedanib because of severe diarrhea.  The diarrhea resolved pretty much within a few days.  After that she was doing okay but was having intermittent nausea.  1 such episode was severe on 03/01/2022 approximately 3 weeks after stopping nintedanib] she ended up in the ER.  Was found to have: Adherent to the bladder and the ovary.  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  This was the sigmoid colon.  She also has intermittent left lower quadrant pain.  She was seen by Willette Cluster nurse practitioner 03/11/2022.  A colonoscopy has been scheduled.  03/21/2022 cardiology clearance was obtained.    Otherwise, from a shortness of breath perspective she is stable.  There is no changes here  From a fall perspective she is seen neurology 03/06/2022.  She saw Dr. Daisy Blossom.  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  Also multifocal peripheral predominant bilateral chronic microhemorrhage may indicate cerebral amyloid angiopathy.  Mini-Mental scale was 22/30 Aricept is being started.Today she is here with her daughter Casimer Bilis.  She was able to understand everything I was saying and giving a good history.  She gave all the details about her neurology visit GI visit all accurately.  Also the ER visit.  CT Chest data  OV 05/27/2022  Subjective:  Patient ID: Jean Rosenthal, female , DOB: 1941-01-01 , age 58 y.o. , MRN: [id number] , ADDRESS: [address] [place] [address] PCP [name],  MD Patient Care Team: [name], MD as PCP - General (Family Medicine) Runell Gess, MD as PCP - Cardiology (Cardiology) Duffy, Vella Kohler, LCSW as Social Worker (Licensed Clinical Social Worker) [place], [facility] Pollyann Savoy, MD as Consulting Physician (Rheumatology) Darcella Cheshire, NP (Rheumatology) Adline Potter, MD as Referring Physician (Pulmonary Disease)  This Provider for this visit: Treatment Team:  Attending Provider: Kalman Shan, MD 05/27/2022 -   Chief Complaint  Patient presents with   Follow-up    Some SOB and pain on right breast area.  Not every day.     HPI Lynnell F Booze 82 y.o. -presents for follow-up with her daughter.  Patient is giving most of the history but daughter is corroborating certain key parts.  Since her last visit she visited Dr. Raynelle Fanning freed in the interstitial lung disease clinic at [facility].  Her assessment was to hold off on pirfenidone given patient's current GI issue and very slow progression only.  I concur with this.  I reviewed her note.  That was the main question for this visit.  From a ILD perspective she continues to be stable compared to last visit.  There is no pulmonary function test this visit but symptom wise she is stable.  Her main issues are GI issues.  She has seen Dr. Axel Filler at [facility] surgery in [place] in December 2023.  I reviewed his note as well.  He has recommended very conservative line of management with MiraLAX.  The belief is that opioids with chronic constipation caused the diverticulitis.  We did do some vaccine counseling.  She has had a flu shot but she is yet to have her RSV vaccine and also shingles vaccine.  We discussed about this.  She has had a COVID vaccines      OV 12/22/2022  Subjective:  Patient ID: Jean Rosenthal, female , DOB: 1941-02-27 , age 5 y.o. , MRN: [id number] , ADDRESS: [address] [place] [address] PCP  [name], MD Patient Care Team: [name], MD as PCP - General (Family Medicine) Runell Gess, MD as PCP - Cardiology (Cardiology) Duffy, Vella Kohler, LCSW as Social Worker (Licensed Clinical Social Worker) [facility], [facility] Pollyann Savoy, MD as Consulting Physician (Rheumatology) Darcella Cheshire, NP (Rheumatology) Adline Potter, MD as Referring Physician (Pulmonary Disease) Sherrill Raring, RPH (Inactive) (Pharmacist)  This Provider for this visit: Treatment Team:  Attending Provider: Kalman Shan, MD    Problem List - copy and paste from [facility] - DR Trula Ore B: 1.  Extensive bronchiectasis - cylindrical vs. Traction - has been present and not significantly changed from 2014 to 2017                       -  has history of hospitalization for what sounds like pneumonia vs. Bronchiectasis flare as far back as 1999; underwent bronchoscopy which made her feel "like a new woman" in 1999 2.  Pulmonary fibrosis - basilar predominant reticular opacities that have not change from 2014 to 2017 and in dec 2020 CT chest 3.  Rheumatoid arthritis: RF 959 ; CCP >300                       - has been treated with methotrexate, biologics, etc in the past - none in the past several years                       - currently on 10 mg prednisone daily    -Started nintedanib?  Approximately May 2023 but stopped September 2023 because of significant diarrhea   -Joint ILD-Pro registry 04/01/2022 3b.  Normal right heart catheterization March 2023 4.  Morbid obesity with deconditioning - has had several hospitalizations in recent months; very deconditioned at present time  - intentl wt loss +  5. COvid - Nov 2022 associated with hospitalizations for dehydratin  6. 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 Augu 2024/July 2024: Admission for lower GI bleed rectal bleed nadir hemoglobin 9 g% December 16, 2022   12/22/2022 -   Chief Complaint   Patient presents with   Follow-up    F/up on PFT     HPI Kersten F Vittitow 82 y.o. -presents for follow-up.  She tells me that her shortness of breath is stable.  Infective pulmonary function test shows recent stability.  There are no issues here.  Her antifibrotic's are on hold after nintedanib related GI issues and subsequent ongoing diverticular issues.  Most recently last week she was admitted for lower GI bleed.  Eliquis was held but she was told to restart.  She has not restarted this because of fear of recurrent bleeding.  She is also having chronic back pain and she wants to have spinal injections.  She wants to know if it is okay for her to hold her Eliquis.  She has had PE 1 year ago but remotely she did have some DVT as well.  At this point in time Eliquis on hold no active bleeding.  We did discuss the pros and cons of holding Eliquis.  Did indicate for the moment that we can hold.  This will allow her to get some control with her bleeding and help her anemia resolve and also get some spinal injections.  We can check D-dimer [she specifically asked for her D-dimer to be checked] I suspect it will still be elevated because of recent hospitalization and then recheck again in a few months.  If it is still elevated we can consider low-dose Eliquis.  She is agreeable with this plan.      SYMPTOM SCALE - ILD 06/21/2019 Birth weight not on file Last Weight  Most recent update: 06/21/2019 11:53 AM    Weight  113.3 kg (249 lb 12.8 oz)             05/23/2021 224# (used to 300# some yearsaago) 07/12/2021 224# 02/04/2022 Ofev  04/01/2022 Off ofev 12/22/2022   O2 use 2L nithg 3L 3L  Ra rest.  Nighttime oxygen and daytime as needed   Shortness of Breath 0 -> 5 scale with 5 being worst (score 6 If unable to do)       At rest 2  2 3 2  0   Simple tasks - showers, clothes change, eating, shaving 5 4 4 3 2    Household (dishes, doing bed, laundry) 4  5 4 2    Shopping 6  5 4 5    Walking level at own  pace 3 4 5 5 4    Walking up Stairs 6  x 0 5   Total (30-36) Dyspnea Score 26 3 22 19 18    How bad is your cough? 3 3 2 5 1    How bad is your fatigue 0 0 4 5 4    How bad is nausea 0 0 0 0 0 to 4   How bad is vomiting?  0 0 0 0 0   How bad is diarrhea? 0 0 0 5 0   How bad is anxiety? 2 4 3 5 4    How bad is depression 2 3 3 5 4         PFT     Latest Ref Rng & Units 12/22/2022   10:33 AM 02/04/2022    3:06 PM 07/12/2021    2:29 PM 01/12/2014   12:59 PM 09/12/2013   10:02 AM 04/28/2013   10:07 AM  ILD indicators  FVC-Pre L 1.49  P 1.47  1.29  1.59  1.78  1.73   FVC-Predicted Pre % 58  P 62  70  76  77  74   FVC-Post L    1.46  1.54  1.19   FVC-Predicted Post %    70  66  51   TLC L    3.55  3.57  2.94   TLC Predicted %    74  70  58   DLCO uncorrected ml/min/mmHg  11.92  -0.03  16.69  12.50    DLCO UNC %Pred %  67  0  77  51    DLCO Corrected ml/min/mmHg  11.89  -0.03      DLCO COR %Pred %  67  0        P Preliminary result      LAB RESULTS last 96 hours MR LUMBAR SPINE WO CONTRAST  Result Date: 12/19/2022 CLINICAL DATA:  Initial evaluation for low back pain, spondyloarthropathy. Right-sided radicular symptoms. EXAM: MRI LUMBAR SPINE WITHOUT CONTRAST TECHNIQUE: Multiplanar, multisequence MR imaging of the lumbar spine was performed. No intravenous contrast was administered. COMPARISON:  Prior radiograph from 12/17/2022 and MRI from 12/27/2019. FINDINGS: Segmentation: Standard. Lowest well-formed disc space labeled the L5-S1 level. Alignment: Moderate to severe dextroscoliosis. 2 mm anterolisthesis of L1 on L2, 4 mm retrolisthesis of L2 on L3, 2 mm anterolisthesis of L3 on L4, 4 mm anterolisthesis of L4 on L5, with 2 mm anterolisthesis of L5 on S1. Findings chronic and degenerative. Vertebrae: Mild chronic height loss at the L2 vertebral body. Vertebral body height otherwise maintained with no acute or recent fracture. Underlying bone marrow signal intensity within normal limits.  There is question of a new T1 hypointense lesion measuring approximately 3 cm within the right aspect of the L2 vertebral body (series 11, image 5). Question mildly increased STIR signal intensity at this level. Finding is nonspecific. No other worrisome osseous lesions. Mild reactive edema about the left L1-2 and right L4-5 facets due to facet arthritis. Mild reactive endplate change present about the L1-2 and L2-3 interspaces. No other abnormal marrow edema. Conus medullaris and cauda equina: Conus extends to the T12-L1 level. Conus medullaris within normal limits. Proximal nerve roots of the cauda equina are irregular due  to distal stenosis. Paraspinal and other soft tissues: Paraspinous soft tissues demonstrate no acute finding. Chronic atrophy noted about the posterior paraspinous musculature, as well as the left greater than right psoas musculature. Few scattered T2 hyperintense cyst noted about the kidneys. These are better evaluated on recent abdominal CT where these are benign in appearance, no follow-up imaging recommended. Disc levels: T12-L1: Diffuse disc bulge with reactive endplate spurring. Disc bulging slightly asymmetric to the left with left paracentral annular fissure. Mild facet hypertrophy. Resultant mild narrowing of the left lateral recess. Central canal remains patent. No significant foraminal stenosis. L1-2: Degenerative intervertebral disc space narrowing with diffuse disc bulge. Reactive endplate spurring, worse on the left. Superimposed left subarticular to foraminal disc extrusion with superior migration (series 12, image 7). Superimposed mild to moderate facet hypertrophy with ligament flavum thickening. Resultant severe spinal stenosis. Severe left L1 foraminal narrowing. Right neural foramen remains patent. L2-3: Degenerative disc space narrowing with diffuse disc bulge and disc desiccation. Superimposed left subarticular to foraminal disc extrusion with superior migration (series 12,  image 12). A separate right foraminal disc extrusion with slight superior migration present as well (series 9, image 8). Moderate bilateral facet arthrosis. Resultant moderate to severe canal with bilateral subarticular stenosis. Severe bilateral L2 foraminal narrowing. L3-4: Diffuse disc bulge, asymmetric to the right. Associated reactive endplate spurring, worse on the right. Superimposed right foraminal to extraforaminal disc protrusion contacts the exiting right L3 nerve root (series 12, image 19). Moderate facet hypertrophy. Resultant severe spinal stenosis. Moderate right worse than left L3 foraminal narrowing. L4-5: Anterolisthesis. Broad base right foraminal to extraforaminal disc protrusion with slight superior migration (series 12, image 25). Severe bilateral facet arthrosis. Resultant severe spinal stenosis. Moderate right worse than left L4 foraminal narrowing. L5-S1: Disc desiccation without significant disc bulge. Small left foraminal disc protrusion with annular fissure (series 9, image 12). Severe right with moderate left facet arthrosis with ankylosis on the right. No significant spinal stenosis. Foramina remain patent. IMPRESSION: 1. Progressive dextroscoliosis with advanced multilevel lumbar spondylosis and facet arthrosis, resulting in moderate to severe diffuse spinal stenosis at L1-2 through L4-5 as above. Specific note is made of multiple right-sided disc protrusions at L2-3 through L4-5, which could contribute to right-sided symptoms. 2. Multifactorial degenerative changes with resultant severe left L1 and bilateral L2 foraminal stenosis, with moderate right worse than left L3 and L4 foraminal narrowing. 3. Question approximate 3 cm T1 hypointense lesion within the right aspect of the L2 vertebral body. Finding is not entirely certain given the advanced degenerative changes through this region. Further evaluation with dedicated postcontrast imaging of the lumbar spine recommended.  Additionally, correlation with dedicated PET CT could be performed for further evaluation as warranted. Electronically Signed   By: Rise Mu M.D.   On: 12/19/2022 03:29    LAB RESULTS last 90 days Recent Results (from the past 2160 hour(s))  Lipid panel     Status: Abnormal   Collection Time: 09/29/22  9:21 AM  Result Value Ref Range   Cholesterol, Total 146 100 - 199 mg/dL   Triglycerides 960 (H) 0 - 149 mg/dL   HDL 42 >45 mg/dL   VLDL Cholesterol Cal 28 5 - 40 mg/dL   LDL Chol Calc (NIH) 76 0 - 99 mg/dL   Chol/HDL Ratio 3.5 0.0 - 4.4 ratio    Comment:  T. Chol/HDL Ratio                                             Men  Women                               1/2 Avg.Risk  3.4    3.3                                   Avg.Risk  5.0    4.4                                2X Avg.Risk  9.6    7.1                                3X Avg.Risk 23.4   11.0   Comprehensive metabolic panel     Status: Abnormal   Collection Time: 09/29/22  9:21 AM  Result Value Ref Range   Glucose 92 70 - 99 mg/dL   BUN 25 8 - 27 mg/dL   Creatinine, Ser 6.57 0.57 - 1.00 mg/dL   eGFR 61 >84 ON/GEX/5.28   BUN/Creatinine Ratio 26 12 - 28   Sodium 145 (H) 134 - 144 mmol/L   Potassium 3.9 3.5 - 5.2 mmol/L   Chloride 102 96 - 106 mmol/L   CO2 30 (H) 20 - 29 mmol/L   Calcium 8.8 8.7 - 10.3 mg/dL   Total Protein 5.4 (L) 6.0 - 8.5 g/dL   Albumin 3.7 (L) 3.8 - 4.8 g/dL   Globulin, Total 1.7 1.5 - 4.5 g/dL   Albumin/Globulin Ratio 2.2 1.2 - 2.2   Bilirubin Total 0.2 0.0 - 1.2 mg/dL   Alkaline Phosphatase 90 44 - 121 IU/L   AST 13 0 - 40 IU/L   ALT 17 0 - 32 IU/L  CMP     Status: Abnormal   Collection Time: 11/28/22 10:50 AM  Result Value Ref Range   Sodium 141 135 - 145 mEq/L   Potassium 3.7 3.5 - 5.1 mEq/L   Chloride 99 96 - 112 mEq/L   CO2 33 (H) 19 - 32 mEq/L   Glucose, Bld 114 (H) 70 - 99 mg/dL   BUN 19 6 - 23 mg/dL   Creatinine, Ser 4.13 0.40 - 1.20 mg/dL    Total Bilirubin 0.4 0.2 - 1.2 mg/dL   Alkaline Phosphatase 78 39 - 117 U/L   AST 16 0 - 37 U/L   ALT 25 0 - 35 U/L   Total Protein 6.5 6.0 - 8.3 g/dL   Albumin 4.0 3.5 - 5.2 g/dL   GFR 24.40 (L) >10.27 mL/min    Comment: Calculated using the CKD-EPI Creatinine Equation (2021)   Calcium 8.9 8.4 - 10.5 mg/dL  CBC with Differential/Platelet     Status: Abnormal   Collection Time: 11/28/22 10:50 AM  Result Value Ref Range   WBC 7.0 4.0 - 10.5 K/uL   RBC 4.05 3.87 - 5.11 Mil/uL   Hemoglobin 12.1 12.0 - 15.0 g/dL   HCT 25.3 66.4 - 40.3 %   MCV 96.5 78.0 - 100.0 fl  MCHC 31.1 30.0 - 36.0 g/dL   RDW 78.4 (H) 69.6 - 29.5 %   Platelets 172.0 150.0 - 400.0 K/uL   Neutrophils Relative % 50.3 43.0 - 77.0 %   Lymphocytes Relative 34.5 12.0 - 46.0 %   Monocytes Relative 11.0 3.0 - 12.0 %   Eosinophils Relative 3.4 0.0 - 5.0 %   Basophils Relative 0.8 0.0 - 3.0 %   Neutro Abs 3.5 1.4 - 7.7 K/uL   Lymphs Abs 2.4 0.7 - 4.0 K/uL   Monocytes Absolute 0.8 0.1 - 1.0 K/uL   Eosinophils Absolute 0.2 0.0 - 0.7 K/uL   Basophils Absolute 0.1 0.0 - 0.1 K/uL  TSH     Status: None   Collection Time: 11/28/22 10:50 AM  Result Value Ref Range   TSH 1.91 0.35 - 5.50 uIU/mL  Microalbumin/Creatinine Ratio, Urine     Status: None   Collection Time: 11/28/22 10:50 AM  Result Value Ref Range   Microalb, Ur <0.7 0.0 - 1.9 mg/dL   Creatinine,U 28.4 mg/dL   Microalb Creat Ratio 3.0 0.0 - 30.0 mg/g  Lipase, blood     Status: None   Collection Time: 12/15/22  1:46 PM  Result Value Ref Range   Lipase 48 11 - 51 U/L    Comment: Performed at [facility], [address], [place], [place] [address]  Comprehensive metabolic panel     Status: Abnormal   Collection Time: 12/15/22  1:46 PM  Result Value Ref Range   Sodium 143 135 - 145 mmol/L   Potassium 3.9 3.5 - 5.1 mmol/L   Chloride 101 98 - 111 mmol/L   CO2 34 (H) 22 - 32 mmol/L   Glucose, Bld 170 (H) 70 - 99 mg/dL    Comment: Glucose  reference range applies only to samples taken after fasting for at least 8 hours.   BUN 18 8 - 23 mg/dL   Creatinine, Ser 0.10 0.44 - 1.00 mg/dL   Calcium 8.6 (L) 8.9 - 10.3 mg/dL   Total Protein 6.3 (L) 6.5 - 8.1 g/dL   Albumin 4.0 3.5 - 5.0 g/dL   AST 12 (L) 15 - 41 U/L   ALT 15 0 - 44 U/L   Alkaline Phosphatase 64 38 - 126 U/L   Total Bilirubin 0.4 0.3 - 1.2 mg/dL   GFR, Estimated >27 >25 mL/min    Comment: (NOTE) Calculated using the CKD-EPI Creatinine Equation (2021)    Anion gap 8 5 - 15    Comment: Performed at [facility], [address] [place], [place] [address]  CBC     Status: Abnormal   Collection Time: 12/15/22  1:46 PM  Result Value Ref Range   WBC 9.4 4.0 - 10.5 K/uL   RBC 3.47 (L) 3.87 - 5.11 MIL/uL   Hemoglobin 10.5 (L) 12.0 - 15.0 g/dL   HCT 03.4 (L) 74.2 - 59.5 %   MCV 100.0 80.0 - 100.0 fL   MCH 30.3 26.0 - 34.0 pg   MCHC 30.3 30.0 - 36.0 g/dL   RDW 63.8 75.6 - 43.3 %   Platelets 156 150 - 400 K/uL   nRBC 0.0 0.0 - 0.2 %    Comment: Performed at [facility], [address] [place], [place] [address]  Urinalysis, Routine w reflex microscopic -Urine, Clean Catch     Status: Abnormal   Collection Time: 12/15/22  5:27 PM  Result Value Ref Range   Color, Urine YELLOW YELLOW   APPearance  CLEAR CLEAR   Specific Gravity, Urine 1.014 1.005 - 1.030   pH 5.5 5.0 - 8.0   Glucose, UA NEGATIVE NEGATIVE mg/dL   Hgb urine dipstick SMALL (A) NEGATIVE   Bilirubin Urine NEGATIVE NEGATIVE   Ketones, ur NEGATIVE NEGATIVE mg/dL   Protein, ur NEGATIVE NEGATIVE mg/dL   Nitrite POSITIVE (A) NEGATIVE   Leukocytes,Ua NEGATIVE NEGATIVE   RBC / HPF 0-5 0 - 5 RBC/hpf   WBC, UA 0-5 0 - 5 WBC/hpf   Bacteria, UA RARE (A) NONE SEEN   Squamous Epithelial / HPF 0-5 0 - 5 /HPF   Mucus PRESENT    Hyaline Casts, UA PRESENT     Comment: Performed at [facility], [address], [place], [place] [address]  Occult blood  card to lab, stool Provider will collect     Status: Abnormal   Collection Time: 12/15/22  5:27 PM  Result Value Ref Range   Fecal Occult Bld POSITIVE (A) NEGATIVE    Comment: Performed at [facility], [address], [place], [place] [address]  CBG monitoring, ED     Status: Abnormal   Collection Time: 12/15/22 10:52 PM  Result Value Ref Range   Glucose-Capillary 132 (H) 70 - 99 mg/dL    Comment: Glucose reference range applies only to samples taken after fasting for at least 8 hours.  Hemoglobin and hematocrit, blood     Status: Abnormal   Collection Time: 12/16/22  6:28 AM  Result Value Ref Range   Hemoglobin 9.5 (L) 12.0 - 15.0 g/dL   HCT 86.5 (L) 78.4 - 69.6 %    Comment: Performed at [facility], [address] [place], [place] [address]  Glucose, capillary     Status: None   Collection Time: 12/16/22  8:27 AM  Result Value Ref Range   Glucose-Capillary 86 70 - 99 mg/dL    Comment: Glucose reference range applies only to samples taken after fasting for at least 8 hours.  Hemoglobin A1c     Status: None   Collection Time: 12/16/22  9:43 AM  Result Value Ref Range   Hgb A1c MFr Bld 5.5 4.8 - 5.6 %    Comment: (NOTE)         Prediabetes: 5.7 - 6.4         Diabetes: >6.4         Glycemic control for adults with diabetes: <7.0    Mean Plasma Glucose 111 mg/dL    Comment: (NOTE) Performed At: [facility] [address] [place], [place] [id number] Jolene Schimke MD UV:[id number]   Type and screen [facility]     Status: None   Collection Time: 12/16/22  9:43 AM  Result Value Ref Range   ABO/RH(D) O POS    Antibody Screen NEG    Sample Expiration      12/19/2022,2359 Performed at [facility], [address] [place], [place] [address]   Glucose, capillary     Status: Abnormal   Collection Time: 12/16/22 12:02 PM  Result Value Ref Range   Glucose-Capillary 124 (H) 70 - 99 mg/dL     Comment: Glucose reference range applies only to samples taken after fasting for at least 8 hours.  Hemoglobin and hematocrit, blood     Status: Abnormal   Collection Time: 12/16/22  2:21 PM  Result Value Ref Range   Hemoglobin 9.1 (L) 12.0 - 15.0 g/dL   HCT 56.3 (L) 87.5 - 64.3 %  Comment: Performed at [facility], [address], [place], [place] [address]  Glucose, capillary     Status: Abnormal   Collection Time: 12/16/22  4:25 PM  Result Value Ref Range   Glucose-Capillary 194 (H) 70 - 99 mg/dL    Comment: Glucose reference range applies only to samples taken after fasting for at least 8 hours.  Glucose, capillary     Status: Abnormal   Collection Time: 12/16/22  9:13 PM  Result Value Ref Range   Glucose-Capillary 135 (H) 70 - 99 mg/dL    Comment: Glucose reference range applies only to samples taken after fasting for at least 8 hours.  Hemoglobin and hematocrit, blood     Status: Abnormal   Collection Time: 12/16/22 10:12 PM  Result Value Ref Range   Hemoglobin 9.0 (L) 12.0 - 15.0 g/dL   HCT 24.4 (L) 01.0 - 27.2 %    Comment: Performed at [facility], [address] [place], [place] [address]  Glucose, capillary     Status: None   Collection Time: 12/17/22  7:53 AM  Result Value Ref Range   Glucose-Capillary 86 70 - 99 mg/dL    Comment: Glucose reference range applies only to samples taken after fasting for at least 8 hours.  Comprehensive metabolic panel     Status: Abnormal   Collection Time: 12/17/22  8:23 AM  Result Value Ref Range   Sodium 143 135 - 145 mmol/L   Potassium 3.6 3.5 - 5.1 mmol/L   Chloride 104 98 - 111 mmol/L   CO2 31 22 - 32 mmol/L   Glucose, Bld 86 70 - 99 mg/dL    Comment: Glucose reference range applies only to samples taken after fasting for at least 8 hours.   BUN 9 8 - 23 mg/dL   Creatinine, Ser 4.03 0.44 - 1.00 mg/dL   Calcium 8.2 (L) 8.9 - 10.3 mg/dL   Total Protein 5.4 (L) 6.5 - 8.1 g/dL   Albumin  3.0 (L) 3.5 - 5.0 g/dL   AST 14 (L) 15 - 41 U/L   ALT 16 0 - 44 U/L   Alkaline Phosphatase 61 38 - 126 U/L   Total Bilirubin 0.5 0.3 - 1.2 mg/dL   GFR, Estimated >47 >42 mL/min    Comment: (NOTE) Calculated using the CKD-EPI Creatinine Equation (2021)    Anion gap 8 5 - 15    Comment: Performed at [facility], [address], [place], [place] [address]  CBC     Status: Abnormal   Collection Time: 12/17/22  8:23 AM  Result Value Ref Range   WBC 9.3 4.0 - 10.5 K/uL   RBC 3.16 (L) 3.87 - 5.11 MIL/uL   Hemoglobin 9.7 (L) 12.0 - 15.0 g/dL   HCT 87.5 (L) 64.3 - 32.9 %   MCV 103.2 (H) 80.0 - 100.0 fL   MCH 30.7 26.0 - 34.0 pg   MCHC 29.8 (L) 30.0 - 36.0 g/dL   RDW 51.8 84.1 - 66.0 %   Platelets 142 (L) 150 - 400 K/uL   nRBC 0.0 0.0 - 0.2 %    Comment: Performed at [facility], [address], [place], [place] [address]  Glucose, capillary     Status: Abnormal   Collection Time: 12/17/22 11:55 AM  Result Value Ref Range   Glucose-Capillary 196 (H) 70 - 99 mg/dL    Comment: Glucose reference range applies only to samples taken after fasting for at  least 8 hours.  Glucose, capillary     Status: Abnormal   Collection Time: 12/17/22  4:54 PM  Result Value Ref Range   Glucose-Capillary 154 (H) 70 - 99 mg/dL    Comment: Glucose reference range applies only to samples taken after fasting for at least 8 hours.  Glucose, capillary     Status: Abnormal   Collection Time: 12/17/22  8:25 PM  Result Value Ref Range   Glucose-Capillary 123 (H) 70 - 99 mg/dL    Comment: Glucose reference range applies only to samples taken after fasting for at least 8 hours.  CBC     Status: Abnormal   Collection Time: 12/18/22  3:54 AM  Result Value Ref Range   WBC 9.2 4.0 - 10.5 K/uL   RBC 2.94 (L) 3.87 - 5.11 MIL/uL   Hemoglobin 9.0 (L) 12.0 - 15.0 g/dL   HCT 82.9 (L) 56.2 - 13.0 %   MCV 103.4 (H) 80.0 - 100.0 fL   MCH 30.6 26.0 - 34.0 pg   MCHC 29.6 (L) 30.0 -  36.0 g/dL   RDW 86.5 78.4 - 69.6 %   Platelets 142 (L) 150 - 400 K/uL   nRBC 0.0 0.0 - 0.2 %    Comment: Performed at [facility], [address] [place], [place] [address]  Basic metabolic panel     Status: Abnormal   Collection Time: 12/18/22  3:54 AM  Result Value Ref Range   Sodium 141 135 - 145 mmol/L   Potassium 3.4 (L) 3.5 - 5.1 mmol/L   Chloride 101 98 - 111 mmol/L   CO2 30 22 - 32 mmol/L   Glucose, Bld 85 70 - 99 mg/dL    Comment: Glucose reference range applies only to samples taken after fasting for at least 8 hours.   BUN 11 8 - 23 mg/dL   Creatinine, Ser 4.13 0.44 - 1.00 mg/dL   Calcium 8.0 (L) 8.9 - 10.3 mg/dL   GFR, Estimated >24 >40 mL/min    Comment: (NOTE) Calculated using the CKD-EPI Creatinine Equation (2021)    Anion gap 10 5 - 15    Comment: Performed at [facility], [address], [place], [place] [address]  Glucose, capillary     Status: None   Collection Time: 12/18/22  7:50 AM  Result Value Ref Range   Glucose-Capillary 88 70 - 99 mg/dL    Comment: Glucose reference range applies only to samples taken after fasting for at least 8 hours.  Glucose, capillary     Status: Abnormal   Collection Time: 12/18/22 11:53 AM  Result Value Ref Range   Glucose-Capillary 119 (H) 70 - 99 mg/dL    Comment: Glucose reference range applies only to samples taken after fasting for at least 8 hours.  Glucose, capillary     Status: Abnormal   Collection Time: 12/18/22  4:38 PM  Result Value Ref Range   Glucose-Capillary 158 (H) 70 - 99 mg/dL    Comment: Glucose reference range applies only to samples taken after fasting for at least 8 hours.  Glucose, capillary     Status: Abnormal   Collection Time: 12/18/22  9:35 PM  Result Value Ref Range   Glucose-Capillary 108 (H) 70 - 99 mg/dL    Comment: Glucose reference range applies only to samples taken after fasting for at least 8 hours.  Glucose, capillary     Status: None    Collection Time: 12/19/22  7:56 AM  Result Value Ref Range   Glucose-Capillary 88 70 - 99 mg/dL    Comment: Glucose reference range applies only to samples taken after fasting for at least 8 hours.  Glucose, capillary     Status: Abnormal   Collection Time: 12/19/22 11:38 AM  Result Value Ref Range   Glucose-Capillary 133 (H) 70 - 99 mg/dL    Comment: Glucose reference range applies only to samples taken after fasting for at least 8 hours.  Pulmonary function test     Status: None (Preliminary result)   Collection Time: 12/22/22 10:33 AM  Result Value Ref Range   FVC-Pre 1.49 L   FVC-%Pred-Pre 58 %   FEV1-Pre 1.29 L   FEV1-%Pred-Pre 67 %   FEV6-Pre 1.49 L   FEV6-%Pred-Pre 61 %   Pre FEV1/FVC ratio 86 %   FEV1FVC-%Pred-Pre 117 %   Pre FEV6/FVC Ratio 100 %   FEV6FVC-%Pred-Pre 105 %   FEF 25-75 Pre 1.36 L/sec   FEF2575-%Pred-Pre 100 %         has a past medical history of Acute on chronic diastolic (congestive) heart failure (HCC), Anemia, Anxiety, Arthritis, Asthma, Carotid artery occlusion, Chronic fatigue, Chronic kidney disease, Closed left hip fracture (HCC), Clotting disorder (HCC), Contusion of left knee, COPD (chronic obstructive pulmonary disease) (HCC), Depression, reactive, Diabetes mellitus, Diastolic dysfunction, Difficulty in walking, Family history of heart disease, Generalized muscle weakness, Gout, High cholesterol, History of falling, Hypertension, Hypothyroidism, Interstitial lung disease (HCC), 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   reports that she has never smoked. She has never used smokeless tobacco.  Past Surgical History:  Procedure Laterality Date   BRAIN SURGERY     Gamma  knife 10/13. Needs repeat spring  '14   CARDIAC CATHETERIZATION N/A 10/31/2014   Procedure: Right/Left Heart Cath and Coronary Angiography;  Surgeon: Corky Crafts, MD;  Location: [facility] INVASIVE CV LAB;  Service: Cardiovascular;  Laterality: N/A;   COLONOSCOPY WITH PROPOFOL N/A 04/14/2022   Procedure: COLONOSCOPY WITH PROPOFOL;  Surgeon: Beverley Fiedler, MD;  Location: [facility] ENDOSCOPY;  Service: Gastroenterology;  Laterality: N/A;   CYST EXCISION     2 on face   CYST REMOVAL NECK     ESOPHAGOGASTRODUODENOSCOPY (EGD) WITH PROPOFOL N/A 09/14/2014   Procedure: ESOPHAGOGASTRODUODENOSCOPY (EGD) WITH PROPOFOL;  Surgeon: Louis Meckel, MD;  Location: [facility] ENDOSCOPY;  Service: Endoscopy;  Laterality: N/A;   INCISION AND DRAINAGE HIP Left 01/16/2017   Procedure: IRRIGATION AND DEBRIDEMENT LEFT HIP;  Surgeon: Kathryne Hitch, MD;  Location: [facility] ORS;  Service: Orthopedics;  Laterality: Left;   INTRAMEDULLARY (IM) NAIL INTERTROCHANTERIC Left 11/29/2016   Procedure: INTRAMEDULLARY (IM) NAIL INTERTROCHANTRIC;  Surgeon: Kathryne Hitch, MD;  Location: [facility] OR;  Service: Orthopedics;  Laterality: Left;   OVARY SURGERY     RIGHT HEART CATH N/A 08/12/2021   Procedure: RIGHT HEART CATH;  Surgeon: Runell Gess, MD;  Location: [facility] INVASIVE CV LAB;  Service: Cardiovascular;  Laterality: N/A;   SHOULDER SURGERY Left    TONSILLECTOMY  age 68   VAGINAL HYSTERECTOMY     VIDEO BRONCHOSCOPY Bilateral 05/31/2013   Procedure: VIDEO BRONCHOSCOPY WITHOUT FLUORO;  Surgeon: Kalman Shan, MD;  Location: [facility] ENDOSCOPY;  Service: Cardiopulmonary;  Laterality: Bilateral;   video bronscoscopy  05/19/1998   lung    Allergies  Allergen Reactions   Codeine Swelling and Other (See Comments)    Facial swelling, Chest pain, and swelling in legs    Infliximab Anaphylaxis    (REMICADE) "sent me into shock"    Lisinopril Swelling and Rash    Face and neck swelling    Ofev [Nintedanib] Diarrhea     Immunization History  Administered Date(s) Administered   Fluad Quad(high Dose 65+) 02/02/2019, 03/11/2021, 04/01/2022   Influenza Split 01/29/2012, 03/30/2013, 02/10/2014   Influenza, High Dose Seasonal PF 02/16/2017, 02/11/2018, 01/31/2019, 02/27/2020   Influenza,inj,Quad PF,6+ Mos 02/09/2015, 12/18/2015   Influenza-Unspecified 02/09/2015, 12/18/2015   PFIZER Comirnaty(Gray Top)Covid-19 Tri-Sucrose Vaccine 07/24/2019, 08/14/2019, 02/27/2020   PFIZER(Purple Top)SARS-COV-2 Vaccination 07/17/2019, 08/14/2019   PPD Test 04/23/2015   Pneumococcal Conjugate-13 08/21/2014   Pneumococcal Polysaccharide-23 02/17/2012   Tdap 12/27/2015    Family History  Problem Relation Age of Onset   Diabetes Mother    Heart attack Mother    Hypertension Father    Lung cancer Father    Diabetes Sister    Breast cancer Sister    Breast cancer Sister    Diabetes Brother    Hypertension Brother    Heart disease Brother    Heart attack Brother    Kidney cancer Brother    Gout Brother    Kidney failure Brother        x 5   Esophageal cancer Brother    Stomach cancer Brother    Prostate cancer Brother    Uterine cancer Daughter        with mets   Fibroids Daughter    Hypertension Daughter    Hypertension Daughter    Hypertension Son    Heart murmur Son    Rheum arthritis Maternal Uncle    Alzheimer's disease Neg Hx    Dementia Neg Hx      Current Outpatient Medications:    acetaminophen (TYLENOL) 500 MG tablet, Take 1,000 mg by mouth as needed for moderate pain., Disp: , Rfl:    albuterol (PROVENTIL) (2.5 MG/3ML) 0.083% nebulizer solution, Take 3 mLs (2.5 mg total) by nebulization every 6 (six) hours as needed for wheezing. (Patient taking differently: Take 2.5 mg by nebulization 2 (two) times daily as needed for wheezing or shortness of breath.), Disp: 75 mL, Rfl: 0   albuterol (VENTOLIN HFA) 108 (90 Base) MCG/ACT inhaler, Inhale 2 puffs into the lungs as needed for wheezing or shortness  of breath., Disp: , Rfl:    allopurinol (ZYLOPRIM) 300 MG tablet, Take 1 tablet (300 mg total) by mouth daily., Disp: 30 tablet, Rfl: 0   apixaban (ELIQUIS) 5 MG TABS tablet, Take 1 tablet (5 mg total) by mouth 2 (two) times daily., Disp: 180 tablet, Rfl: 1   Artificial Tear Ointment (DRY EYES OP), Place 2 drops into both eyes as needed (dry eyes)., Disp: , Rfl:    atorvastatin (LIPITOR) 10 MG tablet, Take 1 tablet (10 mg total) by mouth daily. (Patient taking differently: Take 10 mg by mouth at bedtime.), Disp: 90 tablet, Rfl: 3   azithromycin (ZITHROMAX) 250 MG tablet, Take 250 mg by mouth 3 (three) times a week. Monday, Wednesday, and Friday, Disp: , Rfl:    Calcium Carb-Cholecalciferol (CALCIUM + D3 PO), Take 2 tablets by mouth daily., Disp: , Rfl:    Camphor-Menthol-Methyl Sal (SALONPAS EX), Apply 1 patch topically as needed (shoulder and leg pain)., Disp: , Rfl:    Cholecalciferol (VITAMIN D3) 50 MCG (2000 UT) capsule, Take 2,000 Units by mouth daily.,  Disp: , Rfl:    colchicine 0.6 MG tablet, Take 1 tablet (0.6 mg total) by mouth daily as needed (gout flares)., Disp: 30 tablet, Rfl: 0   cyclobenzaprine (FLEXERIL) 5 MG tablet, Take 1 tablet (5 mg total) by mouth at bedtime as needed for muscle spasms., Disp: 90 tablet, Rfl: 2   diclofenac Sodium (VOLTAREN) 1 % GEL, Apply 2 g topically 3 (three) times daily. Apply to both hands and knees (Patient taking differently: Apply 1 Application topically in the morning, at noon, and at bedtime.), Disp: 100 g, Rfl: 1   donepezil (ARICEPT) 10 MG tablet, Take 1 tablet (10 mg total) by mouth at bedtime., Disp: 90 tablet, Rfl: 4   DULoxetine (CYMBALTA) 60 MG capsule, Take 1 capsule (60 mg total) by mouth daily., Disp: 90 capsule, Rfl: 2   etanercept (ENBREL SURECLICK) 50 MG/ML injection, Inject 50 mg into the skin once a week., Disp: , Rfl:    fluticasone (FLONASE) 50 MCG/ACT nasal spray, Place 1 spray into both nostrils daily. (Patient taking differently:  Place 2 sprays into both nostrils daily.), Disp: 16 g, Rfl: 3   furosemide (LASIX) 80 MG tablet, TAKE 1 TABLET(80 MG) BY MOUTH DAILY, Disp: 30 tablet, Rfl: 11   gabapentin (NEURONTIN) 600 MG tablet, Take 2 tablets (1,200 mg total) by mouth 3 (three) times daily., Disp: 540 tablet, Rfl: 1   guaiFENesin (MUCINEX) 600 MG 12 hr tablet, Take 2 tablets (1,200 mg total) by mouth 2 (two) times daily., Disp: 120 tablet, Rfl: 0   hydrALAZINE (APRESOLINE) 25 MG tablet, TAKE 1 TABLET(25 MG) BY MOUTH TWICE DAILY, Disp: 180 tablet, Rfl: 1   hydroxychloroquine (PLAQUENIL) 200 MG tablet, Take 1 tablet (200 mg total) by mouth daily., Disp: 30 tablet, Rfl: 0   leflunomide (ARAVA) 20 MG tablet, Take 1 tablet (20 mg total) by mouth daily., Disp: 30 tablet, Rfl: 0   levothyroxine (SYNTHROID) 50 MCG tablet, Take 1 tablet (50 mcg total) by mouth daily., Disp: 90 tablet, Rfl: 3   lidocaine (LIDODERM) 5 %, Place 1 patch onto the skin daily. Remove & Discard patch within 12 hours or as directed by MD, Disp: 30 patch, Rfl: 0   linaclotide (LINZESS) 72 MCG capsule, Take 1 capsule (72 mcg total) by mouth daily before breakfast., Disp: 90 capsule, Rfl: 3   Menthol, Topical Analgesic, (BIOFREEZE ROLL-ON EX), Apply 1 Application topically as needed (pain)., Disp: , Rfl:    metolazone (ZAROXOLYN) 2.5 MG tablet, TAKE 1 TABLET BY MOUTH DAILY AS NEEDED FOR 2 DAYS FOR WEIGHT ABOVE 255 POUNDS, Disp: 30 tablet, Rfl: 0   metoprolol succinate (TOPROL-XL) 100 MG 24 hr tablet, TAKE 1 TABLET BY MOUTH DAILY ALONG WITH 25 MG TABLET (Patient taking differently: Take 100 mg by mouth daily.), Disp: 90 tablet, Rfl: 3   metoprolol succinate (TOPROL-XL) 25 MG 24 hr tablet, Take 25 mg (1 tab) along with a 100 mg tab for a total daily dose of 125 mg. (Patient taking differently: Take 25 mg by mouth daily.), Disp: 90 tablet, Rfl: 3   morphine (MS CONTIN) 15 MG 12 hr tablet, Take 1 tablet (15 mg total) by mouth every 12 (twelve) hours., Disp: 60 tablet,  Rfl: 0   morphine (MSIR) 15 MG tablet, Take 1 tablet (15 mg total) by mouth 2 (two) times daily. (Patient taking differently: Take 15 mg by mouth 2 (two) times daily as needed (break through pain).), Disp: 60 tablet, Rfl: 0   Multiple Vitamin (MULTIVITAMIN WITH MINERALS) TABS, Take  1 tablet by mouth daily. , Disp: , Rfl:    olmesartan (BENICAR) 40 MG tablet, Take 1 tablet (40 mg total) by mouth daily., Disp: 90 tablet, Rfl: 3   ondansetron (ZOFRAN-ODT) 4 MG disintegrating tablet, Take 1 tablet (4 mg total) by mouth every 8 (eight) hours as needed for nausea or vomiting., Disp: 30 tablet, Rfl: 1   OXYGEN, Inhale 3 L into the lungs See admin instructions. 3 L at bedtime, and 3 L during the day as needed for exertion, Disp: , Rfl:    pantoprazole (PROTONIX) 40 MG tablet, Take 1 tablet (40 mg total) by mouth 2 (two) times daily. take 1 tablet by mouth once daily MINUTES BEFORE 1ST MEAL OF THE DAY, Disp: 120 tablet, Rfl: 3   Potassium Chloride ER 20 MEQ TBCR, Take 1 tablet (20 mEq total) by mouth daily., Disp: 90 tablet, Rfl: 3   predniSONE (DELTASONE) 10 MG tablet, Take 1 tablet (10 mg total) by mouth daily with breakfast., Disp: 90 tablet, Rfl: 1   Semaglutide,0.25 or 0.5MG /DOS, (OZEMPIC, 0.25 OR 0.5 MG/DOSE,) 2 MG/1.5ML SOPN, Inject 0.5 mg into the skin once a week., Disp: 4.5 mL, Rfl: 3      Objective:   Vitals:   12/22/22 1101  BP: 110/60  Pulse: 88  SpO2: 90%  Weight: 223 lb 12.8 oz (101.5 kg)  Height: 5\' 4"  (1.626 m)    Estimated body mass index is 38.42 kg/m as calculated from the following:   Height as of this encounter: 5\' 4"  (1.626 m).   Weight as of this encounter: 223 lb 12.8 oz (101.5 kg).  @WEIGHTCHANGE @  [employer]   12/22/22 1101  Weight: 223 lb 12.8 oz (101.5 kg)     Physical Exam   General: No distress. Obese O2 at rest: no Cane present: no Sitting in wheel chair: YES Frail: no Obese: YES Neuro: Alert and Oriented x 3. GCS 15. Speech normal Psych:  Pleasant Resp:  Barrel Chest - no.  Wheeze - no, Crackles - YES, No overt respiratory distress CVS: Normal heart sounds. Murmurs - no Ext: Stigmata of Connective Tissue Disease - no HEENT: Normal upper airway. PEERL +. No post nasal drip        Assessment:       ICD-10-CM   1. Interstitial lung disease due to connective tissue disease (HCC)  J84.89 CBC w/Diff   M35.9 D-Dimer, Quantitative    2. History of rheumatoid arthritis  Z87.39     3. History of pulmonary embolism  Z86.711     4. History of lower GI bleeding  Z87.19          Plan:     Patient Instructions     ICD-10-CM   1. Interstitial lung disease due to connective tissue disease (HCC)  J84.89    M35.9     2. History of rheumatoid arthritis  Z87.39     3. History of pulmonary embolism  Z86.711     4. History of lower GI bleeding  Z87.19       Pulmonary Fibrosis is stable recently Recent July 2024 Lower GI Bleed and resultant anemia Compleed 13 months of eliquids for PE treatment  Plan  --No antifibrotic till  GI issues sorted out ad disease progresses further; (Ofev to be listed as allergy) =check cbc and d-dimer 12/22/2022 - hold off eliquis for now   Followup  - 3 mnths with Dr. Marchelle Gearing   = 30 min visit; symptoms score at followup; can  look at reepeat d-dime and decide on atleast low dose eliqusi at followup   FOLLOWUP Return in about 3 months (around 03/24/2023) for 15 min visit, ILD, Face to Face Visit, with Dr Marchelle Gearing.    SIGNATURE    Dr. Kalman Shan, M.D., F.C.C.P,  Pulmonary and Critical Care Medicine Staff Physician, [facility] Director - Interstitial Lung Disease  Program  Pulmonary Fibrosis [facility] [place], [place], [address]  Pager: [phone], If no answer or between  15:00h - 7:00h: call [phone] Telephone: [phone]  1:49 PM 12/22/2022   Moderate Complexity MDM OFFICE  2021 E/M guidelines, first  released in 2021, with minor revisions added in 2023 and 2024 Must meet the requirements for 2 out of 3 dimensions to qualify.    Number and complexity of problems addressed Amount and/or complexity of data reviewed Risk of complications and/or morbidity  One or more chronic illness with mild exacerbation, OR progression, OR  side effects of treatment  Two or more stable chronic illnesses  One undiagnosed new problem with uncertain prognosis  One acute illness with systemic symptoms   One Acute complicated injury Must meet the requirements for 1 of 3 of the categories)  Category 1: Tests and documents, historian  Any combination of 3 of the following:  Assessment requiring an independent historian  Review of prior external note(s) from each unique source  Review of results of each unique test  Ordering of each unique test    Category 2: Interpretation of tests   Independent interpretation of a test performed by another physician/other qualified health care professional (not separately reported)  Category 3: Discuss management/tests  Discussion of management or test interpretation with external physician/other qualified health care professional/appropriate source (not separately reported) Moderate risk of morbidity from additional diagnostic testing or treatment Examples only:  Prescription drug management  Decision regarding minor surgery with identfied patient or procedure risk factors  Decision regarding elective major surgery without identified patient or procedure risk factors  Diagnosis or treatment significantly limited by social determinants of health

## 2022-12-21 NOTE — Patient Instructions (Incomplete)
ICD-10-CM   1. Interstitial lung disease due to connective tissue disease ([place])  J84.89    M35.9     2. History of rheumatoid arthritis  Z87.39        ILD slowly getting worse over years but stable sinc last visit in June 2023 . -> sept 2023 Severe diarrhea due to nintedanib even at the low-dose - resolved sep 2023 ater stopping ofev But  you have new issue of concerns for diverticulitis mid oct 2023 and seen by surgeon dec 2023  - you are on miralax for this     Plan  - No more  nintedanib ever -No antifibrotic till  GI issues sorted out ad disease progresses further = RSV Vaccine on your own recommended again  - Please talk to PCP [name], MD -  and ensure you get  shingrix (GSK) inactivated vaccine against shingles - spirometry and dlco in 6 months   Followup  - 6 mnths with [name]   = 30 min visit; symptoms score at followup

## 2022-12-22 ENCOUNTER — Encounter: Payer: Self-pay | Admitting: Internal Medicine

## 2022-12-22 ENCOUNTER — Telehealth: Payer: Medicare Other | Admitting: Family Medicine

## 2022-12-22 ENCOUNTER — Ambulatory Visit (INDEPENDENT_AMBULATORY_CARE_PROVIDER_SITE_OTHER): Payer: Medicare Other | Admitting: Internal Medicine

## 2022-12-22 ENCOUNTER — Telehealth: Payer: Self-pay | Admitting: Internal Medicine

## 2022-12-22 VITALS — BP 110/60 | HR 88 | Ht 64.0 in | Wt 223.8 lb

## 2022-12-22 DIAGNOSIS — I272 Pulmonary hypertension, unspecified: Secondary | ICD-10-CM | POA: Diagnosis not present

## 2022-12-22 DIAGNOSIS — Z8719 Personal history of other diseases of the digestive system: Secondary | ICD-10-CM | POA: Diagnosis not present

## 2022-12-22 DIAGNOSIS — G8929 Other chronic pain: Secondary | ICD-10-CM | POA: Diagnosis not present

## 2022-12-22 DIAGNOSIS — Z86711 Personal history of pulmonary embolism: Secondary | ICD-10-CM | POA: Diagnosis not present

## 2022-12-22 DIAGNOSIS — N321 Vesicointestinal fistula: Secondary | ICD-10-CM | POA: Diagnosis not present

## 2022-12-22 DIAGNOSIS — J9610 Chronic respiratory failure, unspecified whether with hypoxia or hypercapnia: Secondary | ICD-10-CM | POA: Diagnosis not present

## 2022-12-22 DIAGNOSIS — M4726 Other spondylosis with radiculopathy, lumbar region: Secondary | ICD-10-CM | POA: Diagnosis not present

## 2022-12-22 DIAGNOSIS — E669 Obesity, unspecified: Secondary | ICD-10-CM | POA: Diagnosis not present

## 2022-12-22 DIAGNOSIS — I5032 Chronic diastolic (congestive) heart failure: Secondary | ICD-10-CM | POA: Diagnosis not present

## 2022-12-22 DIAGNOSIS — J8410 Pulmonary fibrosis, unspecified: Secondary | ICD-10-CM | POA: Diagnosis not present

## 2022-12-22 DIAGNOSIS — D62 Acute posthemorrhagic anemia: Secondary | ICD-10-CM | POA: Diagnosis not present

## 2022-12-22 DIAGNOSIS — J8489 Other specified interstitial pulmonary diseases: Secondary | ICD-10-CM

## 2022-12-22 DIAGNOSIS — J479 Bronchiectasis, uncomplicated: Secondary | ICD-10-CM | POA: Diagnosis not present

## 2022-12-22 DIAGNOSIS — M160 Bilateral primary osteoarthritis of hip: Secondary | ICD-10-CM | POA: Diagnosis not present

## 2022-12-22 DIAGNOSIS — E1140 Type 2 diabetes mellitus with diabetic neuropathy, unspecified: Secondary | ICD-10-CM | POA: Diagnosis not present

## 2022-12-22 DIAGNOSIS — Z8739 Personal history of other diseases of the musculoskeletal system and connective tissue: Secondary | ICD-10-CM

## 2022-12-22 DIAGNOSIS — I700 Atherosclerosis of aorta: Secondary | ICD-10-CM | POA: Diagnosis not present

## 2022-12-22 DIAGNOSIS — M4126 Other idiopathic scoliosis, lumbar region: Secondary | ICD-10-CM | POA: Diagnosis not present

## 2022-12-22 DIAGNOSIS — M48061 Spinal stenosis, lumbar region without neurogenic claudication: Secondary | ICD-10-CM | POA: Diagnosis not present

## 2022-12-22 DIAGNOSIS — M9983 Other biomechanical lesions of lumbar region: Secondary | ICD-10-CM | POA: Diagnosis not present

## 2022-12-22 DIAGNOSIS — I130 Hypertensive heart and chronic kidney disease with heart failure and stage 1 through stage 4 chronic kidney disease, or unspecified chronic kidney disease: Secondary | ICD-10-CM | POA: Diagnosis not present

## 2022-12-22 DIAGNOSIS — M359 Systemic involvement of connective tissue, unspecified: Secondary | ICD-10-CM

## 2022-12-22 DIAGNOSIS — M5126 Other intervertebral disc displacement, lumbar region: Secondary | ICD-10-CM | POA: Diagnosis not present

## 2022-12-22 DIAGNOSIS — M069 Rheumatoid arthritis, unspecified: Secondary | ICD-10-CM | POA: Diagnosis not present

## 2022-12-22 DIAGNOSIS — J849 Interstitial pulmonary disease, unspecified: Secondary | ICD-10-CM | POA: Diagnosis not present

## 2022-12-22 DIAGNOSIS — I2699 Other pulmonary embolism without acute cor pulmonale: Secondary | ICD-10-CM | POA: Diagnosis not present

## 2022-12-22 DIAGNOSIS — E1122 Type 2 diabetes mellitus with diabetic chronic kidney disease: Secondary | ICD-10-CM | POA: Diagnosis not present

## 2022-12-22 DIAGNOSIS — N1831 Chronic kidney disease, stage 3a: Secondary | ICD-10-CM | POA: Diagnosis not present

## 2022-12-22 DIAGNOSIS — Z6838 Body mass index (BMI) 38.0-38.9, adult: Secondary | ICD-10-CM | POA: Diagnosis not present

## 2022-12-22 DIAGNOSIS — K5790 Diverticulosis of intestine, part unspecified, without perforation or abscess without bleeding: Secondary | ICD-10-CM | POA: Diagnosis not present

## 2022-12-22 LAB — PULMONARY FUNCTION TEST
FEF 25-75 Pre: 1.36 L/sec
FEF2575-%Pred-Pre: 100 %
FEV1-%Pred-Pre: 67 %
FEV1-Pre: 1.29 L
FEV1FVC-%Pred-Pre: 117 %
FEV6-%Pred-Pre: 61 %
FEV6-Pre: 1.49 L
FEV6FVC-%Pred-Pre: 105 %
FVC-%Pred-Pre: 58 %
FVC-Pre: 1.49 L
Pre FEV1/FVC ratio: 86 %
Pre FEV6/FVC Ratio: 100 %

## 2022-12-22 LAB — CBC WITH DIFFERENTIAL/PLATELET
Basophils Absolute: 0.2 10*3/uL — ABNORMAL HIGH (ref 0.0–0.1)
Basophils Relative: 2.5 % (ref 0.0–3.0)
Eosinophils Absolute: 0.2 10*3/uL (ref 0.0–0.7)
Eosinophils Relative: 2.7 % (ref 0.0–5.0)
HCT: 31.8 % — ABNORMAL LOW (ref 36.0–46.0)
Hemoglobin: 9.9 g/dL — ABNORMAL LOW (ref 12.0–15.0)
Lymphocytes Relative: 19 % (ref 12.0–46.0)
Lymphs Abs: 1.7 10*3/uL (ref 0.7–4.0)
MCHC: 31.3 g/dL (ref 30.0–36.0)
MCV: 98 fl (ref 78.0–100.0)
Monocytes Absolute: 0.8 10*3/uL (ref 0.1–1.0)
Monocytes Relative: 9.1 % (ref 3.0–12.0)
Neutro Abs: 5.9 10*3/uL (ref 1.4–7.7)
Neutrophils Relative %: 66.7 % (ref 43.0–77.0)
Platelets: 170 10*3/uL (ref 150.0–400.0)
RBC: 3.25 Mil/uL — ABNORMAL LOW (ref 3.87–5.11)
RDW: 16.5 % — ABNORMAL HIGH (ref 11.5–15.5)
WBC: 8.8 10*3/uL (ref 4.0–10.5)

## 2022-12-22 LAB — D-DIMER, QUANTITATIVE: D-Dimer, Quant: 2.65 mcg/mL FEU — ABNORMAL HIGH (ref <0.50)

## 2022-12-22 NOTE — Patient Instructions (Signed)
Spiro/DLCO performed today. 

## 2022-12-22 NOTE — Progress Notes (Signed)
Spiro/DLCO performed today. 

## 2022-12-22 NOTE — Telephone Encounter (Signed)
Anemia stable D-dimer as anticipated this is effect of hospitalization last week  Plan  - ok to be off eliquis for another 2-4 weeks ; low but not zero risk for clot - come back and recheck d-dimer in 2weeks

## 2022-12-23 ENCOUNTER — Telehealth: Payer: Self-pay | Admitting: Physical Medicine and Rehabilitation

## 2022-12-23 ENCOUNTER — Telehealth: Payer: Self-pay

## 2022-12-23 NOTE — Telephone Encounter (Signed)
Patient called needing to make a 1 wk f/u appt. CB#[phone]

## 2022-12-23 NOTE — Transitions of Care (Post Inpatient/ED Visit) (Signed)
12/23/2022  Name: [name] MRN: [id number] DOB: [birth year]  Today's TOC FU Call Status: Today's TOC FU Call Status:: Successful TOC FU Call Completed TOC FU Call Complete Date: 12/23/22  Transition Care Management Follow-up Telephone Call Discharge Facility: [facility]) Type of Discharge: Inpatient Admission Primary Inpatient Discharge Diagnosis:: "rectal bleeding" How have you been since you were released from the hospital?: Better (pt reports she is doing well-no further bleeding-having normal BM daily-appetite good. She has pain to right side leg & shoulder-awaiting appt for injections) Any questions or concerns?: No  Items Reviewed: Did you receive and understand the discharge instructions provided?: Yes Medications obtained,verified, and reconciled?: Yes (Medications Reviewed) Any new allergies since your discharge?: No Dietary orders reviewed?: Yes Type of Diet Ordered:: low salt/heart healthy/carb modified Do you have support at home?: Yes People in Home: child(ren), adult Name of Support/Comfort Primary Source: daughter and son live with her, spouse in the home as well  Medications Reviewed Today: Medications Reviewed Today     Reviewed by Charlyn Minerva, RN (Registered Nurse) on 12/23/22 at [phone]  Med List Status: <None>   Medication Order Taking? Sig Documenting Provider Last Dose Status Informant  acetaminophen (TYLENOL) 500 MG tablet [id number] Yes Take 1,000 mg by mouth as needed for moderate pain. [provider] Taking Active Self, Pharmacy Records           Med Note Tiburcio Pea, Joyce Copa Aug 08, 2021  4:43 PM)    albuterol (PROVENTIL) (2.5 MG/3ML) 0.083% nebulizer solution [id number] Yes Take 3 mLs (2.5 mg total) by nebulization every 6 (six) hours as needed for wheezing.  Patient taking differently: Take 2.5 mg by nebulization 2 (two) times daily as needed for wheezing or shortness of breath.   Medina-Vargas, Monina C, NP Taking  Active Self, Pharmacy Records  albuterol (VENTOLIN HFA) 108 (90 Base) MCG/ACT inhaler [id number] Yes Inhale 2 puffs into the lungs as needed for wheezing or shortness of breath. [provider] Taking Active Self, Pharmacy Records  allopurinol (ZYLOPRIM) 300 MG tablet [id number] Yes Take 1 tablet (300 mg total) by mouth daily. Medina-Vargas, Monina C, NP Taking Active Self, Pharmacy Records  apixaban (ELIQUIS) 5 MG TABS tablet [id number] No Take 1 tablet (5 mg total) by mouth 2 (two) times daily.  Patient not taking: Reported on 12/23/2022   Runell Gess, MD Not Taking Active Self, Pharmacy Records  Artificial Tear Ointment (DRY EYES OP) [id number] Yes Place 2 drops into both eyes as needed (dry eyes). [provider] Taking Active Self, Pharmacy Records  atorvastatin (LIPITOR) 10 MG tablet [id number] Yes Take 1 tablet (10 mg total) by mouth daily.  Patient taking differently: Take 10 mg by mouth at bedtime.   [name], MD Taking Active Self, Pharmacy Records           Med Note (CRUTHIS, CHLOE C   Tue Dec 16, 2022 10:08 AM) Pt is adamant she is taking 10 mg (one tablet) daily.   azithromycin (ZITHROMAX) 250 MG tablet [id number] Yes Take 250 mg by mouth 3 (three) times a week. Monday, Wednesday, and Friday [provider] Taking Active Self, Pharmacy Records  Calcium Carb-Cholecalciferol (CALCIUM + D3 PO) [id number] Yes Take 2 tablets by mouth daily. [provider] Taking Active Self, Pharmacy Records  Camphor-Menthol-Methyl Sal [facility]) [id number] Yes Apply 1 patch topically as needed (shoulder and leg pain). [provider] Taking Active Self, Pharmacy Records  Cholecalciferol (VITAMIN D3) 50 MCG (  2000 UT) capsule [id number] Yes Take 2,000 Units by mouth daily. [provider] Taking Active Self, Pharmacy Records  colchicine 0.6 MG tablet [id number] Yes Take 1 tablet (0.6 mg total) by mouth daily as needed (gout flares). Medina-Vargas,  Margit Banda, NP Taking Active Self, Pharmacy Records           Med Note (CRUTHIS, CHLOE C   Tue Dec 16, 2022 10:08 AM) Pt is unsure of last dose.   cyclobenzaprine (FLEXERIL) 5 MG tablet [id number] Yes Take 1 tablet (5 mg total) by mouth at bedtime as needed for muscle spasms. Jones Bales, NP Taking Active Self, Pharmacy Records  diclofenac Sodium (VOLTAREN) 1 % GEL [id number] Yes Apply 2 g topically 3 (three) times daily. Apply to both hands and knees  Patient taking differently: Apply 1 Application topically in the morning, at noon, and at bedtime.   Medina-Vargas, Monina C, NP Taking Active Self, Pharmacy Records  donepezil (ARICEPT) 10 MG tablet [id number] Yes Take 1 tablet (10 mg total) by mouth at bedtime. Anson Fret, MD Taking Active Self, Pharmacy Records           Med Note (CRUTHIS, CHLOE C   Tue Dec 16, 2022  9:55 AM) Pt did verify she is taking one 10 mg tablet at bedtime.   DULoxetine (CYMBALTA) 60 MG capsule [id number] Yes Take 1 capsule (60 mg total) by mouth daily. Jones Bales, NP Taking Active Self, Pharmacy Records  etanercept (ENBREL SURECLICK) 50 MG/ML injection [id number] Yes Inject 50 mg into the skin once a week. [provider] Taking Active Self, Pharmacy Records  fluticasone [facility]) 50 MCG/ACT nasal spray [id number] Yes Place 1 spray into both nostrils daily.  Patient taking differently: Place 2 sprays into both nostrils daily.   [name], MD Taking Active Self, Pharmacy Records  furosemide (LASIX) 80 MG tablet [id number] Yes TAKE 1 TABLET(80 MG) BY MOUTH DAILY Runell Gess, MD Taking Active Self, Pharmacy Records  gabapentin (NEURONTIN) 600 MG tablet [id number] Yes Take 2 tablets (1,200 mg total) by mouth 3 (three) times daily. Jones Bales, NP Taking Active Self, Pharmacy Records  guaiFENesin (MUCINEX) 600 MG 12 hr tablet [id number] Yes Take 2 tablets (1,200 mg total) by mouth 2 (two) times daily. Medina-Vargas, Margit Banda, NP Taking Active  Self, Pharmacy Records           Med Note (CRUTHIS, CHLOE C   Tue Dec 16, 2022 10:12 AM) Pt is adamant she is taking this medication twice a day everyday.   hydrALAZINE (APRESOLINE) 25 MG tablet [id number] Yes TAKE 1 TABLET(25 MG) BY MOUTH TWICE DAILY Runell Gess, MD Taking Active Self, Pharmacy Records  hydroxychloroquine (PLAQUENIL) 200 MG tablet [id number] Yes Take 1 tablet (200 mg total) by mouth daily. Medina-Vargas, Margit Banda, NP Taking Active Self, Pharmacy Records  leflunomide (ARAVA) 20 MG tablet [id number] Yes Take 1 tablet (20 mg total) by mouth daily. Medina-Vargas, Margit Banda, NP Taking Active Self, Pharmacy Records  levothyroxine (SYNTHROID) 50 MCG tablet [id number] Yes Take 1 tablet (50 mcg total) by mouth daily. [name], MD Taking Active Self, Pharmacy Records  lidocaine (LIDODERM) 5 % [id number] Yes Place 1 patch onto the skin daily. Remove & Discard patch within 12 hours or as directed by MD Rai, Delene Ruffini, MD Taking Active   linaclotide Karlene Einstein) 72 MCG capsule [id number] Yes Take 1 capsule (72 mcg total) by mouth daily before breakfast. [name], MD Taking Active Self, Pharmacy Records  Menthol, Topical Analgesic, (BIOFREEZE ROLL-ON EX) [id number] Yes Apply 1 Application topically as needed (pain). [provider] Taking Active Self, Pharmacy Records  metolazone (ZAROXOLYN) 2.5 MG tablet [id number] Yes TAKE 1 TABLET BY MOUTH DAILY AS NEEDED FOR 2 DAYS FOR WEIGHT ABOVE 255 POUNDS Medina-Vargas, Monina C, NP Taking Active Self, Pharmacy Records           Med Note (CRUTHIS, CHLOE C   Tue Dec 16, 2022 10:09 AM) Pt is unsure of last dose.   metoprolol succinate (TOPROL-XL) 100 MG 24 hr tablet [id number] Yes TAKE 1 TABLET BY MOUTH DAILY ALONG WITH 25 MG TABLET  Patient taking differently: Take 100 mg by mouth daily.   Runell Gess, MD Taking Active Self, Pharmacy Records           Med Note (CRUTHIS, CHLOE C   Tue Dec 16, 2022 10:09 AM) Take with 25 mg tablet  to equal 125 mg   metoprolol succinate (TOPROL-XL) 25 MG 24 hr tablet [id number] Yes Take 25 mg (1 tab) along with a 100 mg tab for a total daily dose of 125 mg.  Patient taking differently: Take 25 mg by mouth daily.   [name], MD Taking Active Self, Pharmacy Records           Med Note (CRUTHIS, CHLOE C   Tue Dec 16, 2022 10:10 AM) Take with 100 mg tablet to equal 125 mg   morphine (MS CONTIN) 15 MG 12 hr tablet [id number] Yes Take 1 tablet (15 mg total) by mouth every 12 (twelve) hours. Jones Bales, NP Taking Active Self, Pharmacy Records  morphine (MSIR) 15 MG tablet [id number] Yes Take 1 tablet (15 mg total) by mouth 2 (two) times daily.  Patient taking differently: Take 15 mg by mouth 2 (two) times daily as needed (break through pain).   Jones Bales, NP Taking Active Self, Pharmacy Records  Multiple Vitamin (MULTIVITAMIN WITH MINERALS) TABS [id number] Yes Take 1 tablet by mouth daily.  [provider] Taking Active Self, Pharmacy Records  olmesartan (BENICAR) 40 MG tablet [id number] Yes Take 1 tablet (40 mg total) by mouth daily. [name], MD Taking Active Self, Pharmacy Records  ondansetron (ZOFRAN-ODT) 4 MG disintegrating tablet [id number] Yes Take 1 tablet (4 mg total) by mouth every 8 (eight) hours as needed for nausea or vomiting. Napoleon Form, MD Taking Active Self, Pharmacy Records  OXYGEN [id number] Yes Inhale 3 L into the lungs See admin instructions. 3 L at bedtime, and 3 L during the day as needed for exertion [provider] Taking Active Self, Pharmacy Records  pantoprazole (PROTONIX) 40 MG tablet [id number] Yes Take 1 tablet (40 mg total) by mouth 2 (two) times daily. take 1 tablet by mouth once daily MINUTES BEFORE 1ST MEAL OF THE DAY Napoleon Form, MD Taking Active Self, Pharmacy Records  Potassium Chloride ER 20 MEQ TBCR [id number] Yes Take 1 tablet (20 mEq total) by mouth daily. [name], MD Taking Active Self, Pharmacy  Records  predniSONE (DELTASONE) 10 MG tablet [id number] Yes Take 1 tablet (10 mg total) by mouth daily with breakfast. [name], MD Taking Active Self, Pharmacy Records  Semaglutide,0.25 or 0.5MG /DOS, (OZEMPIC, 0.25 OR 0.5 MG/DOSE,) 2 MG/1.5ML SOPN [id number] Yes Inject 0.5 mg into the skin once a week. [name], MD Taking Active Self, Pharmacy Records  Med List Note Jones Bales, NP 06/02/18 1129): [facility] [place]  Home Care and Equipment/Supplies: Were Home Health Services Ordered?: Yes Name of Home Health Agency:: [facility] Has Agency set up a time to come to your home?: Yes First Home Health Visit Date: 12/22/22 Any new equipment or medical supplies ordered?: NA  Functional Questionnaire: Do you need assistance with bathing/showering or dressing?: No Do you need assistance with meal preparation?: No Do you need assistance with eating?: No Do you have difficulty maintaining continence: No Do you need assistance with getting out of bed/getting out of a chair/moving?: No Do you have difficulty managing or taking your medications?: No  Follow up appointments reviewed: PCP Follow-up appointment confirmed?: Yes Date of PCP follow-up appointment?: 12/24/22 Follow-up Provider: Dr. Salomon Fick Specialist [facility] Follow-up appointment confirmed?: Yes Date of Specialist follow-up appointment?: 12/22/22 Follow-Up Specialty Provider:: Dr. Sandria Manly, pt will call Dr. Culloden Blas office to make an appt as well as follow up with GI office Do you need transportation to your follow-up appointment?: No (pt confirms family able to take her to appts) Do you understand care options if your condition(s) worsen?: Yes-patient verbalized understanding  SDOH Interventions Today    Flowsheet Row Most Recent Value  SDOH Interventions   Food Insecurity Interventions Intervention Not Indicated  Transportation Interventions Intervention Not Indicated      TOC Interventions  Today    Flowsheet Row Most Recent Value  TOC Interventions   TOC Interventions Discussed/Reviewed TOC Interventions Discussed      Interventions Today    Flowsheet Row Most Recent Value  Chronic Disease   Chronic disease during today's visit Hypertension (HTN), Congestive Heart Failure (CHF)  General Interventions   General Interventions Discussed/Reviewed General Interventions Discussed, Doctor Visits, Durable Medical Equipment (DME)  Doctor Visits Discussed/Reviewed Doctor Visits Discussed, PCP, Specialist  Durable Medical Equipment (DME) BP Cuff, Other  [scale-pt confirms she has BP machine and scale in the home-monitoring daily-values have been within normal limits, wgt today 223lbs-aware to take BP/wgt log to MD appts]  PCP/Specialist Visits Compliance with follow-up visit  Education Interventions   Education Provided Provided Education  Provided Verbal Education On Nutrition, When to see the doctor, Other  [pain/ sx mgmt]  Nutrition Interventions   Nutrition Discussed/Reviewed Nutrition Discussed, Adding fruits and vegetables, Decreasing fats, Decreasing salt, Fluid intake, Increasing proteins  Pharmacy Interventions   Pharmacy Dicussed/Reviewed Pharmacy Topics Discussed, Medications and their functions  Safety Interventions   Safety Discussed/Reviewed Safety Discussed       Alessandra Grout [facility] Care Management Care Management Community Coordinator Direct Phone: [phone] Toll Free: [phone] Fax: [phone]

## 2022-12-23 NOTE — Telephone Encounter (Signed)
Spoke with patient and she is needing an injection. Patient scheduled OV for 12/24/22

## 2022-12-24 ENCOUNTER — Encounter: Payer: Self-pay | Admitting: Physical Medicine and Rehabilitation

## 2022-12-24 ENCOUNTER — Ambulatory Visit (INDEPENDENT_AMBULATORY_CARE_PROVIDER_SITE_OTHER): Payer: Medicare Other | Admitting: Family Medicine

## 2022-12-24 ENCOUNTER — Ambulatory Visit (INDEPENDENT_AMBULATORY_CARE_PROVIDER_SITE_OTHER): Payer: Medicare Other | Admitting: Physical Medicine and Rehabilitation

## 2022-12-24 VITALS — BP 124/70 | HR 90 | Temp 98.7°F | Wt 223.0 lb

## 2022-12-24 DIAGNOSIS — Z09 Encounter for follow-up examination after completed treatment for conditions other than malignant neoplasm: Secondary | ICD-10-CM

## 2022-12-24 DIAGNOSIS — E1140 Type 2 diabetes mellitus with diabetic neuropathy, unspecified: Secondary | ICD-10-CM | POA: Diagnosis not present

## 2022-12-24 DIAGNOSIS — M79604 Pain in right leg: Secondary | ICD-10-CM

## 2022-12-24 DIAGNOSIS — M05742 Rheumatoid arthritis with rheumatoid factor of left hand without organ or systems involvement: Secondary | ICD-10-CM

## 2022-12-24 DIAGNOSIS — M05741 Rheumatoid arthritis with rheumatoid factor of right hand without organ or systems involvement: Secondary | ICD-10-CM | POA: Diagnosis not present

## 2022-12-24 DIAGNOSIS — D62 Acute posthemorrhagic anemia: Secondary | ICD-10-CM

## 2022-12-24 DIAGNOSIS — M159 Polyosteoarthritis, unspecified: Secondary | ICD-10-CM | POA: Diagnosis not present

## 2022-12-24 DIAGNOSIS — N183 Chronic kidney disease, stage 3 unspecified: Secondary | ICD-10-CM

## 2022-12-24 DIAGNOSIS — K922 Gastrointestinal hemorrhage, unspecified: Secondary | ICD-10-CM

## 2022-12-24 DIAGNOSIS — J8410 Pulmonary fibrosis, unspecified: Secondary | ICD-10-CM | POA: Diagnosis not present

## 2022-12-24 DIAGNOSIS — J849 Interstitial pulmonary disease, unspecified: Secondary | ICD-10-CM | POA: Diagnosis not present

## 2022-12-24 DIAGNOSIS — I5032 Chronic diastolic (congestive) heart failure: Secondary | ICD-10-CM | POA: Diagnosis not present

## 2022-12-24 DIAGNOSIS — M5416 Radiculopathy, lumbar region: Secondary | ICD-10-CM

## 2022-12-24 DIAGNOSIS — R269 Unspecified abnormalities of gait and mobility: Secondary | ICD-10-CM | POA: Diagnosis not present

## 2022-12-24 DIAGNOSIS — M5116 Intervertebral disc disorders with radiculopathy, lumbar region: Secondary | ICD-10-CM

## 2022-12-24 DIAGNOSIS — M48062 Spinal stenosis, lumbar region with neurogenic claudication: Secondary | ICD-10-CM

## 2022-12-24 MED ORDER — LIDOCAINE 5 % EX PTCH
1.0000 | MEDICATED_PATCH | CUTANEOUS | 11 refills | Status: DC
Start: 2022-12-24 — End: 2023-08-04

## 2022-12-24 NOTE — Progress Notes (Signed)
[name] - 82 y.o. female MRN [id number]  Date of birth: Jul 18, 1940  Office Visit Note: Visit Date: 12/24/2022 PCP: [name], MD Referred by: [name], MD  Subjective: Chief Complaint  Patient presents with   Lower Back - Pain   HPI: [name] is a 82 y.o. female who comes in today for evaluation of chronic, worsening and severe bilateral lower back pain radiating to hip, buttock and right anterior thigh. Pain ongoing for several years, worsens with laying flat, prolonged standing and walking. She describes pain as sore, aching and tingling sensation. She reports intermittent tingling to bilateral legs. She reports some relief of pain with rest and use of medications. Patient continues to take 15 mg Morphine PO every 12 hours that is prescribed by Jacalyn Lefevre, NP at [facility]. Patient was recently hospitalized for rectal bleeding on 12/15/2022. Recent lumbar MRI imaging shows progressive dextroscoliosis with advanced multilevel lumbar spondylosis and facet arthrosis, moderate to severe spinal stenosis at L1-L2 through L4-L5. Patient has undergone multiple lumbar epidural steroid injections in our office over the years, most recent was bilateral L4 transforaminal epidural steroid injection 10/22/2021, she reports significant relief of pain with this injection. Patient currently using walker and wheelchair to assist with ambulation. No recent trauma or falls.     Review of Systems  Musculoskeletal:  Positive for back pain and myalgias.  Neurological:  Positive for tingling. Negative for focal weakness and weakness.  All other systems reviewed and are negative.  Otherwise per HPI.  Assessment & Plan: Visit Diagnoses:    ICD-10-CM   1. Lumbar radiculopathy  M54.16 DG INJECT DIAG/THERA/INC NEEDLE/CATH/PLC EPI/LUMB/SAC W/IMG    2. Intervertebral disc disorders with radiculopathy, lumbar region  M51.16 DG INJECT DIAG/THERA/INC  NEEDLE/CATH/PLC EPI/LUMB/SAC W/IMG    3. Spinal stenosis of lumbar region with neurogenic claudication  M48.062 DG INJECT DIAG/THERA/INC NEEDLE/CATH/PLC EPI/LUMB/SAC W/IMG    4. Gait abnormality  R26.9 DG INJECT DIAG/THERA/INC NEEDLE/CATH/PLC EPI/LUMB/SAC W/IMG       Plan: Findings:  Chronic, worsening and severe bilateral lower back pain radiating to hip, buttock and right anterior thigh. Patient continues to have severe pain despite good conservative therapies such as rest and use of medications. Patients clinical presentation and exam are consistent with neurogenic claudication as a result of spinal canal stenosis. There is severe spinal canal stenosis at L1-L2 through L4-L5. Historically, she has done well with bilateral L4 transforaminal epidural steroid injections. Dr. Alvester Morin and myself reviewed most recent lumbar MRI imaging, progressive findings compared to 2021, bilateral foraminal stenosis at L4 is now moderate. Next step is to perform diagnostic and hopefully therapeutic right L5-S1 interlaminar epidural steroid injection under fluoroscopic guidance. Patient would like to have this injection ASAP and is requesting to have injection at [facility]. I did place order for [facility], they will call her to schedule. If good relief of pain this injection can be repeated infrequently as needed. No red flag symptoms noted upon exam today.     Meds & Orders: No orders of the defined types were placed in this encounter.   Orders Placed This Encounter  Procedures   DG INJECT DIAG/THERA/INC NEEDLE/CATH/PLC EPI/LUMB/SAC W/IMG    Follow-up: Return if symptoms worsen or fail to improve.   Procedures: No procedures performed      Clinical History: EXAM: MRI LUMBAR SPINE WITHOUT CONTRAST  Conus medullaris and cauda equina: Conus extends to the T12-L1 level. Conus medullaris within normal limits.  Proximal nerve roots of the cauda equina are irregular due to distal stenosis.   Paraspinal and  other soft tissues: Paraspinous soft tissues demonstrate no acute finding. Chronic atrophy noted about the posterior paraspinous musculature, as well as the left greater than right psoas musculature. Few scattered T2 hyperintense cyst noted about the kidneys. These are better evaluated on recent abdominal CT where these are benign in appearance, no follow-up imaging recommended.   Disc levels:   T12-L1: Diffuse disc bulge with reactive endplate spurring. Disc bulging slightly asymmetric to the left with left paracentral annular fissure. Mild facet hypertrophy. Resultant mild narrowing of the left lateral recess. Central canal remains patent. No significant foraminal stenosis.   L1-2: Degenerative intervertebral disc space narrowing with diffuse disc bulge. Reactive endplate spurring, worse on the left. Superimposed left subarticular to foraminal disc extrusion with superior migration (series 12, image 7). Superimposed mild to moderate facet hypertrophy with ligament flavum thickening. Resultant severe spinal stenosis. Severe left L1 foraminal narrowing. Right neural foramen remains patent.   L2-3: Degenerative disc space narrowing with diffuse disc bulge and disc desiccation. Superimposed left subarticular to foraminal disc extrusion with superior migration (series 12, image 12). A separate right foraminal disc extrusion with slight superior migration present as well (series 9, image 8). Moderate bilateral facet arthrosis. Resultant moderate to severe canal with bilateral subarticular stenosis. Severe bilateral L2 foraminal narrowing.   L3-4: Diffuse disc bulge, asymmetric to the right. Associated reactive endplate spurring, worse on the right. Superimposed right foraminal to extraforaminal disc protrusion contacts the exiting right L3 nerve root (series 12, image 19). Moderate facet hypertrophy. Resultant severe spinal stenosis. Moderate right worse than left L3 foraminal  narrowing.   L4-5: Anterolisthesis. Broad base right foraminal to extraforaminal disc protrusion with slight superior migration (series 12, image 25). Severe bilateral facet arthrosis. Resultant severe spinal stenosis. Moderate right worse than left L4 foraminal narrowing.   L5-S1: Disc desiccation without significant disc bulge. Small left foraminal disc protrusion with annular fissure (series 9, image 12). Severe right with moderate left facet arthrosis with ankylosis on the right. No significant spinal stenosis. Foramina remain patent.   IMPRESSION: 1. Progressive dextroscoliosis with advanced multilevel lumbar spondylosis and facet arthrosis, resulting in moderate to severe diffuse spinal stenosis at L1-2 through L4-5 as above. Specific note is made of multiple right-sided disc protrusions at L2-3 through L4-5, which could contribute to right-sided symptoms. 2. Multifactorial degenerative changes with resultant severe left L1 and bilateral L2 foraminal stenosis, with moderate right worse than left L3 and L4 foraminal narrowing. 3. Question approximate 3 cm T1 hypointense lesion within the right aspect of the L2 vertebral body. Finding is not entirely certain given the advanced degenerative changes through this region. Further evaluation with dedicated postcontrast imaging of the lumbar spine recommended. Additionally, correlation with dedicated PET CT could be performed for further evaluation as warranted.     Electronically Signed   By: Rise Mu M.D.   On: 12/19/2022 03:29   She reports that she has never smoked. She has never used smokeless tobacco.  Recent Labs    09/03/22 1049 12/16/22 0943  HGBA1C 5.8 5.5    Objective:  VS:  HT:    WT:   BMI:     BP:   HR: bpm  TEMP: ( )  RESP:  Physical Exam Vitals and nursing note reviewed.  HENT:     Head: Normocephalic and atraumatic.     Right Ear: External ear normal.  Left Ear: External ear normal.      Nose: Nose normal.     Mouth/Throat:     Mouth: Mucous membranes are moist.  Eyes:     Extraocular Movements: Extraocular movements intact.  Cardiovascular:     Rate and Rhythm: Normal rate.     Pulses: Normal pulses.  Pulmonary:     Effort: Pulmonary effort is normal.  Abdominal:     General: Abdomen is flat. There is no distension.  Musculoskeletal:        General: Tenderness present.     Cervical back: Normal range of motion.     Comments: Patient is slow to rise from seated position to standing. Good lumbar range of motion. No pain noted with facet loading. 5/5 strength noted with bilateral hip flexion, knee flexion/extension, ankle dorsiflexion/plantarflexion and EHL. No clonus noted bilaterally. Pain noted upon palpation of right greater trochanter. No pain with internal/external rotation of bilateral hips. Sensation intact bilaterally. Negative slump test bilaterally. Ambulates with walker, gait slow and unsteady.   Skin:    General: Skin is warm and dry.     Capillary Refill: Capillary refill takes less than 2 seconds.  Neurological:     Mental Status: She is alert and oriented to person, place, and time.     Gait: Gait abnormal.  Psychiatric:        Mood and Affect: Mood normal.        Behavior: Behavior normal.     Ortho Exam  Imaging: No results found.  Past Medical/Family/Surgical/Social History: Medications & Allergies reviewed per EMR, new medications updated. Patient Active Problem List   Diagnosis Date Noted   Lower GI bleed 12/16/2022   BRBPR (bright red blood per rectum) 12/15/2022   [name] 09/08/2022   Abnormal CT scan, sigmoid colon 04/14/2022   Colon stricture (HCC) 04/14/2022   Diverticulosis of colon without hemorrhage 04/14/2022   Pulmonary embolism (HCC) 11/15/2021   Tachycardia 10/22/2021   Pulmonary HTN (HCC)    Unspecified protein-calorie malnutrition (HCC) 04/05/2021   Pressure injury of skin  03/31/2021   UTI (urinary tract infection) 03/31/2021   Acute encephalopathy 03/30/2021   Thrombocytopenia (HCC) 03/30/2021   Diabetic neuropathy (HCC) 05/24/2019   Hypokalemia 04/21/2019   Anticoagulated 06/25/2018   Chronic pain 06/25/2018   CRI (chronic renal insufficiency), stage 3 (moderate) (HCC) 06/25/2018   Drug-induced constipation 06/18/2018   DVT (deep venous thrombosis) (HCC) 06/03/2018   Primary osteoarthritis of both knees 05/26/2018   Body mass index (BMI) of 50-59.9 in adult (HCC) 05/26/2018   Acquired hypothyroidism 05/26/2018   Chronic bilateral low back pain without sciatica 02/17/2018   Astigmatism with presbyopia, bilateral 03/26/2017   Cortical age-related cataract of both eyes 03/26/2017   Family history of glaucoma 03/26/2017   Long term current use of oral hypoglycemic drug 03/26/2017   Nuclear sclerotic cataract of both eyes 03/26/2017   Pain in left hip 02/18/2017   Closed nondisplaced intertrochanteric fracture of left femur with delayed healing 01/14/2017   Gout 11/29/2016   Depression 11/29/2016   Closed left hip fracture (HCC) 11/29/2016   GERD (gastroesophageal reflux disease) 11/29/2016   Closed intertrochanteric fracture of hip, left, initial encounter (HCC) 11/29/2016   Physical deconditioning 07/31/2015   Pulmonary fibrosis, postinflammatory (HCC) 07/31/2015   Bronchiectasis without complication (HCC) 07/31/2015   Tendinitis of left rotator cuff 06/13/2015   Chronic diastolic CHF (congestive heart failure) (HCC) 04/21/2015   Acute kidney injury superimposed on chronic kidney  disease (HCC) 04/17/2015   Chronic respiratory failure with hypoxia (HCC) 02/09/2015   Dyspnea and respiratory abnormality 01/03/2015   Normal coronary arteries 11/02/2014   Morbid obesity (HCC) 10/30/2014   Dysphagia 08/21/2014   Chronic fatigue 06/21/2014   DOE (dyspnea on exertion) 05/24/2014   Primary osteoarthritis of left knee 04/26/2014   High risk medication use  04/17/2014   Aphasia 02/12/2014   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) 01/18/2014   Lumbar facet arthropathy 01/18/2014   Bilateral edema of lower extremity 05/30/2013   Chronic cough 05/24/2013   ILD (interstitial lung disease) (HCC) 04/10/2013   Spinal stenosis of lumbar region with radiculopathy 07/12/2012   Inability to walk 07/12/2012   Meningioma of left sphenoid wing involving cavernous sinus (HCC) 02/17/2012   Chest pain of uncertain etiology 01/28/2012   Rheumatoid arthritis (HCC) 01/28/2012   Primary osteoarthritis of right knee 01/28/2012   Dyslipidemia    Thyroid disease    Essential hypertension    Past Medical History:  Diagnosis Date   Acute on chronic diastolic (congestive) heart failure (HCC)    Anemia    iron deficiency anemia - secondary to blood loss ( chronic)    Anxiety    Arthritis    endstage changes bilateral knees/bilateral ankles.    Asthma    Carotid artery occlusion    Chronic fatigue    Chronic kidney disease    Closed left hip fracture (HCC)    Clotting disorder (HCC)    pt denies this   Contusion of left knee    due to fall 1/14.   COPD (chronic obstructive pulmonary disease) (HCC)    pulmonary fibrosis   Depression, reactive    Diabetes mellitus    type II    Diastolic dysfunction    Difficulty in walking    Family history of heart disease    Generalized muscle weakness    Gout    High cholesterol    History of falling    Hypertension    Hypothyroidism    Interstitial lung disease (HCC)    Meningioma of left sphenoid wing involving cavernous sinus (HCC) 02/17/2012   Continue diplopia, left eye pain and left headaches.     Morbid obesity (HCC)    Neuromuscular disorder (HCC)    diabetic neuropathy    Normal coronary arteries    cardiac catheterization performed  10/31/14   Pneumonia    RA (rheumatoid arthritis) (HCC)    has been off methotreaxte since 10/13.   Spinal stenosis of lumbar region    Thyroid  disease    Unspecified lack of coordination    URI (upper respiratory infection)    Family History  Problem Relation Age of Onset   Diabetes Mother    Heart attack Mother    Hypertension Father    Lung cancer Father    Diabetes Sister    Breast cancer Sister    Breast cancer Sister    Diabetes Brother    Hypertension Brother    Heart disease Brother    Heart attack Brother    Kidney cancer Brother    Gout Brother    Kidney failure Brother        x 5   Esophageal cancer Brother    Stomach cancer Brother    Prostate cancer Brother    Uterine cancer Daughter        with mets   Fibroids Daughter    Hypertension Daughter    Hypertension Daughter  Hypertension Son    Heart murmur Son    Rheum arthritis Maternal Uncle    Alzheimer's disease Neg Hx    Dementia Neg Hx    Past Surgical History:  Procedure Laterality Date   BRAIN SURGERY     Gamma knife 10/13. Needs repeat spring  '14   CARDIAC CATHETERIZATION N/A 10/31/2014   Procedure: Right/Left Heart Cath and Coronary Angiography;  Surgeon: Corky Crafts, MD;  Location: [facility] INVASIVE CV LAB;  Service: Cardiovascular;  Laterality: N/A;   COLONOSCOPY WITH PROPOFOL N/A 04/14/2022   Procedure: COLONOSCOPY WITH PROPOFOL;  Surgeon: Beverley Fiedler, MD;  Location: [facility] ENDOSCOPY;  Service: Gastroenterology;  Laterality: N/A;   CYST EXCISION     2 on face   CYST REMOVAL NECK     ESOPHAGOGASTRODUODENOSCOPY (EGD) WITH PROPOFOL N/A 09/14/2014   Procedure: ESOPHAGOGASTRODUODENOSCOPY (EGD) WITH PROPOFOL;  Surgeon: Louis Meckel, MD;  Location: [facility]OSCOPY;  Service: Endoscopy;  Laterality: N/A;   INCISION AND DRAINAGE HIP Left 01/16/2017   Procedure: IRRIGATION AND DEBRIDEMENT LEFT HIP;  Surgeon: Kathryne Hitch, MD;  Location: [facility] ORS;  Service: Orthopedics;  Laterality: Left;   INTRAMEDULLARY (IM) NAIL INTERTROCHANTERIC Left 11/29/2016   Procedure: INTRAMEDULLARY (IM) NAIL INTERTROCHANTRIC;  Surgeon: Kathryne Hitch, MD;  Location: [facility] OR;  Service: Orthopedics;  Laterality: Left;   OVARY SURGERY     RIGHT HEART CATH N/A 08/12/2021   Procedure: RIGHT HEART CATH;  Surgeon: Runell Gess, MD;  Location: [facility] INVASIVE CV LAB;  Service: Cardiovascular;  Laterality: N/A;   SHOULDER SURGERY Left    TONSILLECTOMY  age 71   VAGINAL HYSTERECTOMY     VIDEO BRONCHOSCOPY Bilateral 05/31/2013   Procedure: VIDEO BRONCHOSCOPY WITHOUT FLUORO;  Surgeon: Kalman Shan, MD;  Location: [facility] ENDOSCOPY;  Service: Cardiopulmonary;  Laterality: Bilateral;   video bronscoscopy  05/19/1998   lung   Social History   Occupational History   Occupation: retired Engineer, civil (consulting)  Tobacco Use   Smoking status: Never   Smokeless tobacco: Never  Vaping Use   Vaping status: Never Used  Substance and Sexual Activity   Alcohol use: No   Drug use: No   Sexual activity: Not Currently

## 2022-12-24 NOTE — Progress Notes (Signed)
Established Patient Office Visit   Subjective  Patient ID: DEBIE JARRELLS, female    DOB: [birth year]  Age: 82 y.o. MRN: [id number]  Chief Complaint  Patient presents with   Referral    Referral for back injections, was getting them but has not had them since April or may.  Recently went to ED  Pt accompanied by her daughter.  HPI  Patient is an 82 year old female with significant pmh of chronic diastolic heart failure, anemia, RA on chronic steroids, CKD stage III, ILD on O2, pulmonary HTN, DM2, HTN, history of PE and DVT on Eliquis who was seen for HFU.  Patient hospitalized 7/29-12/19/2022 for symptomatic anemia 2/2 lower GIB.  Patient noted abdominal pain, bloody BMs and lightheadedness at home times a few days prior to going to ED.  Hemoglobin was 10.5 in ED.  CT scan abdomen pelvis with chronic fistula, diffuse diverticulosis, no diverticulitis or atherosclerosis.  GI consulted and no further bleeding noted.  Per GI okay to advance diet and resume Eliquis after 72 hours of no bleeding.  While in ED patient complained of acute on chronic pain of low back into right groin.  X-rays lumbar spine with DJD.  MRI lumbar spine with progressive dextroscoliosis with advanced multilevel lumbar spondylosis and facet arthrosis resulting in moderate to severe diffuse spinal stenosis at L1-L2 through L4-L5.  Multiple right-sided dispositions at L2-3 through L4-5 which could contribute to right-sided symptoms.  Ortho contacted, however unable to get ESI injections in pt.  Patient followed by  Dr. Alvester Morin.  Home health PT and OT arranged.  Since discharge patient denies continued bleeding/dark stools.  States feeling better.  Energy coming back slowly.  Had repeat labs 8/5 showing stable hemoglobin.  Has appointment with Dr. Alvester Morin, PM and R later today.  Needs referral for back injections.  Had back injections in the past which were helpful.  Still having right groin/anterior thigh pain.  Symptoms thought  2/II nerve pain as opposed to RA.  Narcotics not helping.  Notes improvement in LE edema.  Lidocaine patch on right shoulder helpful.  Past Medical History:  Diagnosis Date   Acute on chronic diastolic (congestive) heart failure (HCC)    Anemia    iron deficiency anemia - secondary to blood loss ( chronic)    Anxiety    Arthritis    endstage changes bilateral knees/bilateral ankles.    Asthma    Carotid artery occlusion    Chronic fatigue    Chronic kidney disease    Closed left hip fracture (HCC)    Clotting disorder (HCC)    pt denies this   Contusion of left knee    due to fall 1/14.   COPD (chronic obstructive pulmonary disease) (HCC)    pulmonary fibrosis   Depression, reactive    Diabetes mellitus    type II    Diastolic dysfunction    Difficulty in walking    Family history of heart disease    Generalized muscle weakness    Gout    High cholesterol    History of falling    Hypertension    Hypothyroidism    Interstitial lung disease (HCC)    Meningioma of left sphenoid wing involving cavernous sinus (HCC) 02/17/2012   Continue diplopia, left eye pain and left headaches.     Morbid obesity (HCC)    Neuromuscular disorder (HCC)    diabetic neuropathy    Normal coronary arteries    cardiac catheterization performed  10/31/14  Pneumonia    RA (rheumatoid arthritis) (HCC)    has been off methotreaxte since 10/13.   Spinal stenosis of lumbar region    Thyroid disease    Unspecified lack of coordination    URI (upper respiratory infection)    Past Surgical History:  Procedure Laterality Date   BRAIN SURGERY     Gamma knife 10/13. Needs repeat spring  '14   CARDIAC CATHETERIZATION N/A 10/31/2014   Procedure: Right/Left Heart Cath and Coronary Angiography;  Surgeon: Corky Crafts, MD;  Location: [facility] INVASIVE CV LAB;  Service: Cardiovascular;  Laterality: N/A;   COLONOSCOPY WITH PROPOFOL N/A 04/14/2022   Procedure: COLONOSCOPY WITH PROPOFOL;  Surgeon: Beverley Fiedler, MD;  Location: [facility] ENDOSCOPY;  Service: Gastroenterology;  Laterality: N/A;   CYST EXCISION     2 on face   CYST REMOVAL NECK     ESOPHAGOGASTRODUODENOSCOPY (EGD) WITH PROPOFOL N/A 09/14/2014   Procedure: ESOPHAGOGASTRODUODENOSCOPY (EGD) WITH PROPOFOL;  Surgeon: Louis Meckel, MD;  Location: [facility] ENDOSCOPY;  Service: Endoscopy;  Laterality: N/A;   INCISION AND DRAINAGE HIP Left 01/16/2017   Procedure: IRRIGATION AND DEBRIDEMENT LEFT HIP;  Surgeon: Kathryne Hitch, MD;  Location: [facility] ORS;  Service: Orthopedics;  Laterality: Left;   INTRAMEDULLARY (IM) NAIL INTERTROCHANTERIC Left 11/29/2016   Procedure: INTRAMEDULLARY (IM) NAIL INTERTROCHANTRIC;  Surgeon: Kathryne Hitch, MD;  Location: [facility] OR;  Service: Orthopedics;  Laterality: Left;   OVARY SURGERY     RIGHT HEART CATH N/A 08/12/2021   Procedure: RIGHT HEART CATH;  Surgeon: Runell Gess, MD;  Location: [facility] INVASIVE CV LAB;  Service: Cardiovascular;  Laterality: N/A;   SHOULDER SURGERY Left    TONSILLECTOMY  age 4   VAGINAL HYSTERECTOMY     VIDEO BRONCHOSCOPY Bilateral 05/31/2013   Procedure: VIDEO BRONCHOSCOPY WITHOUT FLUORO;  Surgeon: Kalman Shan, MD;  Location: [facility] ENDOSCOPY;  Service: Cardiopulmonary;  Laterality: Bilateral;   video bronscoscopy  05/19/1998   lung   Social History   Tobacco Use   Smoking status: Never   Smokeless tobacco: Never  Vaping Use   Vaping status: Never Used  Substance Use Topics   Alcohol use: No   Drug use: No   Family History  Problem Relation Age of Onset   Diabetes Mother    Heart attack Mother    Hypertension Father    Lung cancer Father    Diabetes Sister    Breast cancer Sister    Breast cancer Sister    Diabetes Brother    Hypertension Brother    Heart disease Brother    Heart attack Brother    Kidney cancer Brother    Gout Brother    Kidney failure Brother        x 5   Esophageal cancer Brother    Stomach cancer Brother    Prostate cancer Brother     Uterine cancer Daughter        with mets   Fibroids Daughter    Hypertension Daughter    Hypertension Daughter    Hypertension Son    Heart murmur Son    Rheum arthritis Maternal Uncle    Alzheimer's disease Neg Hx    Dementia Neg Hx    Allergies  Allergen Reactions   Codeine Swelling and Other (See Comments)    Facial swelling, Chest pain, and swelling in legs    Infliximab Anaphylaxis    (REMICADE) "sent me into shock"    Lisinopril Swelling and Rash  Face and neck swelling    Ofev [Nintedanib] Diarrhea      ROS Negative unless stated above    Objective:     BP 124/70 (BP Location: Left Arm, Patient Position: Sitting, Cuff Size: Large)   Pulse 90   Temp 98.7 F (37.1 C) (Oral)   Wt 223 lb (101.2 kg)   SpO2 96%   BMI 38.28 kg/m  BP Readings from Last 3 Encounters:  12/24/22 124/70  12/22/22 110/60  12/19/22 128/77   Wt Readings from Last 3 Encounters:  12/24/22 223 lb (101.2 kg)  12/22/22 223 lb 12.8 oz (101.5 kg)  12/15/22 229 lb 0.9 oz (103.9 kg)      Physical Exam Constitutional:      General: She is not in acute distress.    Appearance: Normal appearance.     Comments: Not curretly wearing O2.  HENT:     Head: Normocephalic and atraumatic.     Nose: Nose normal.     Mouth/Throat:     Mouth: Mucous membranes are moist.  Eyes:     Extraocular Movements: Extraocular movements intact.     Conjunctiva/sclera: Conjunctivae normal.  Cardiovascular:     Rate and Rhythm: Normal rate and regular rhythm.     Heart sounds: Normal heart sounds. No murmur heard.    No gallop.     Comments: Nonpitting edema bilateral LEs right greater than left. Pulmonary:     Effort: Pulmonary effort is normal. No respiratory distress.     Breath sounds: Normal breath sounds. No wheezing, rhonchi or rales.  Abdominal:     General: Bowel sounds are normal. There is no distension.     Palpations: Abdomen is soft.     Tenderness: There is no abdominal tenderness.  There is no guarding or rebound.  Musculoskeletal:     Right lower leg: Edema present.     Left lower leg: Edema present.  Skin:    General: Skin is warm and dry.  Neurological:     Mental Status: She is alert and oriented to person, place, and time. Mental status is at baseline.     Comments: Sitting in transport wheelchair.  Gait not assessed.      No results found for any visits on 12/24/22.    Assessment & Plan:  Hospital discharge follow-up -Labs, notes, imaging from hospitalization reviewed -TCM phone call made and reviewed.  Acute blood loss anemia -Stable -Hemoglobin 9.9 on 12/22/2022 -Currently holding Eliquis -Patient advised to schedule GI appointment -Given strict precautions  Lower GI bleed -Stable.  Not currently bleeding -Schedule appointment with GI  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) -Controlled -Hemoglobin A1c 5.5% on 12/16/2022  Pulmonary fibrosis, postinflammatory (HCC)  CRI (chronic renal insufficiency), stage 3 (moderate) (HCC)  Chronic diastolic CHF (congestive heart failure) (HCC) -Stable -Continue current medications including Lasix 80 mg daily, hydralazine 25 mg twice daily, Toprol XL 125 mg -Metolazone 2.5 mg as needed for weight greater than 255 lbs x 2 days. -Continue follow-up with cardiology  ILD (interstitial lung disease) (HCC) -Continue O2 continuously.  Not currently wearing as was rushing. -Continue follow-up with [facility] pulmonology  Rheumatoid arthritis involving both hands with positive rheumatoid factor (HCC) -     Ambulatory referral to Physical Medicine Rehab  Right leg pain -     Ambulatory referral to Physical Medicine Rehab  Osteoarthritis of multiple joints, unspecified osteoarthritis type -Referral placed to PM and R for back injections -Continue pain regimen per pain management  MS Contin 15 mg twice daily and morphine immediate release 15 mg twice daily as needed for breakthrough pain. -Lidocaine  patch working well for her right shoulder.  Refills sent to pharmacy. -     Ambulatory referral to Physical Medicine Rehab -     Lidocaine; Place 1 patch onto the skin daily. Remove & Discard patch within 12 hours or as directed by MD  Dispense: 60 patch; Refill: 11    Return in about 3 months (around 03/26/2023), or if symptoms worsen or fail to improve.   [name], MD

## 2022-12-24 NOTE — Progress Notes (Signed)
Functional Pain Scale - descriptive words and definitions  Distressing (6)    Pain is present/unable to complete most ADLs limited by pain/sleep is difficult and active distraction is only marginal. Moderate range order  Average Pain 6-7  Lower back pain on right side that radiates to the right thigh

## 2022-12-25 ENCOUNTER — Telehealth: Payer: Self-pay | Admitting: Family Medicine

## 2022-12-25 DIAGNOSIS — G8929 Other chronic pain: Secondary | ICD-10-CM | POA: Diagnosis not present

## 2022-12-25 DIAGNOSIS — D62 Acute posthemorrhagic anemia: Secondary | ICD-10-CM | POA: Diagnosis not present

## 2022-12-25 DIAGNOSIS — M48061 Spinal stenosis, lumbar region without neurogenic claudication: Secondary | ICD-10-CM | POA: Diagnosis not present

## 2022-12-25 DIAGNOSIS — M4726 Other spondylosis with radiculopathy, lumbar region: Secondary | ICD-10-CM | POA: Diagnosis not present

## 2022-12-25 DIAGNOSIS — I5032 Chronic diastolic (congestive) heart failure: Secondary | ICD-10-CM | POA: Diagnosis not present

## 2022-12-25 DIAGNOSIS — I130 Hypertensive heart and chronic kidney disease with heart failure and stage 1 through stage 4 chronic kidney disease, or unspecified chronic kidney disease: Secondary | ICD-10-CM | POA: Diagnosis not present

## 2022-12-25 NOTE — Telephone Encounter (Signed)
Evaluation done, verbal for occupational therapy  1x4

## 2022-12-26 ENCOUNTER — Telehealth: Payer: Self-pay

## 2022-12-26 NOTE — Telephone Encounter (Signed)
ERROR

## 2022-12-28 NOTE — Telephone Encounter (Signed)
Ok

## 2022-12-29 DIAGNOSIS — G8929 Other chronic pain: Secondary | ICD-10-CM | POA: Diagnosis not present

## 2022-12-29 DIAGNOSIS — D62 Acute posthemorrhagic anemia: Secondary | ICD-10-CM | POA: Diagnosis not present

## 2022-12-29 DIAGNOSIS — I130 Hypertensive heart and chronic kidney disease with heart failure and stage 1 through stage 4 chronic kidney disease, or unspecified chronic kidney disease: Secondary | ICD-10-CM | POA: Diagnosis not present

## 2022-12-29 DIAGNOSIS — M4726 Other spondylosis with radiculopathy, lumbar region: Secondary | ICD-10-CM | POA: Diagnosis not present

## 2022-12-29 DIAGNOSIS — I5032 Chronic diastolic (congestive) heart failure: Secondary | ICD-10-CM | POA: Diagnosis not present

## 2022-12-29 DIAGNOSIS — M48061 Spinal stenosis, lumbar region without neurogenic claudication: Secondary | ICD-10-CM | POA: Diagnosis not present

## 2022-12-29 NOTE — Telephone Encounter (Signed)
Called [place], reception kept going out, phone call was disconnected. Called [facility], left VM to call office back as there was no way of knowing if VM was confidential. Please inform Jon Gills of the OKAY for VO from Dr. Salomon Fick in previous note.

## 2022-12-29 NOTE — Telephone Encounter (Signed)
Spoke with Jon Gills, gave OK for VO.

## 2022-12-29 NOTE — Telephone Encounter (Signed)
Patient aware of results and appointment scheduled. 

## 2022-12-30 DIAGNOSIS — M4726 Other spondylosis with radiculopathy, lumbar region: Secondary | ICD-10-CM | POA: Diagnosis not present

## 2022-12-30 DIAGNOSIS — D62 Acute posthemorrhagic anemia: Secondary | ICD-10-CM | POA: Diagnosis not present

## 2022-12-30 DIAGNOSIS — I5032 Chronic diastolic (congestive) heart failure: Secondary | ICD-10-CM | POA: Diagnosis not present

## 2022-12-30 DIAGNOSIS — G8929 Other chronic pain: Secondary | ICD-10-CM | POA: Diagnosis not present

## 2022-12-30 DIAGNOSIS — I130 Hypertensive heart and chronic kidney disease with heart failure and stage 1 through stage 4 chronic kidney disease, or unspecified chronic kidney disease: Secondary | ICD-10-CM | POA: Diagnosis not present

## 2022-12-30 DIAGNOSIS — M48061 Spinal stenosis, lumbar region without neurogenic claudication: Secondary | ICD-10-CM | POA: Diagnosis not present

## 2023-01-01 ENCOUNTER — Ambulatory Visit
Admission: RE | Admit: 2023-01-01 | Discharge: 2023-01-01 | Disposition: A | Discharge status: Home / Self Care | Payer: Medicare Other | Source: Ambulatory Visit | Attending: Physical Medicine and Rehabilitation | Admitting: Physical Medicine and Rehabilitation

## 2023-01-01 DIAGNOSIS — M5416 Radiculopathy, lumbar region: Secondary | ICD-10-CM | POA: Diagnosis not present

## 2023-01-01 DIAGNOSIS — M5116 Intervertebral disc disorders with radiculopathy, lumbar region: Secondary | ICD-10-CM

## 2023-01-01 DIAGNOSIS — M48062 Spinal stenosis, lumbar region with neurogenic claudication: Secondary | ICD-10-CM

## 2023-01-01 DIAGNOSIS — R269 Unspecified abnormalities of gait and mobility: Secondary | ICD-10-CM

## 2023-01-01 MED ORDER — METHYLPREDNISOLONE ACETATE 40 MG/ML INJ SUSP (RADIOLOG
80.0000 mg | Freq: Once | INTRAMUSCULAR | Status: AC
  Administered 2023-01-01: 80 mg via EPIDURAL

## 2023-01-01 MED ORDER — IOPAMIDOL (ISOVUE-M 200) INJECTION 41%
1.0000 mL | Freq: Once | INTRAMUSCULAR | Status: AC
  Administered 2023-01-01: 1 mL via EPIDURAL

## 2023-01-01 NOTE — Discharge Instructions (Signed)
Post Procedure Spinal Discharge Instruction Sheet  You may resume a regular diet and any medications that you routinely take (including pain medications) unless otherwise noted by MD.  No driving day of procedure.  Light activity throughout the rest of the day.  Do not do any strenuous work, exercise, bending or lifting.  The day following the procedure, you can resume normal physical activity but you should refrain from exercising or physical therapy for at least three days thereafter.  You may apply ice to the injection site, 20 minutes on, 20 minutes off, as needed. Do not apply ice directly to skin.    Common Side Effects:  Headaches- take your usual medications as directed by your physician.  Increase your fluid intake.  Caffeinated beverages may be helpful.  Lie flat in bed until your headache resolves.  Restlessness or inability to sleep- you may have trouble sleeping for the next few days.  Ask your referring physician if you need any medication for sleep.  Facial flushing or redness- should subside within a few days.  Increased pain- a temporary increase in pain a day or two following your procedure is not unusual.  Take your pain medication as prescribed by your referring physician.  Leg cramps  Please contact our office at [phone] for the following symptoms: Fever greater than 100 degrees. Headaches unresolved with medication after 2-3 days. Increased swelling, pain, or redness at injection site.   Thank you for visiting [facility] today.   

## 2023-01-06 DIAGNOSIS — M48061 Spinal stenosis, lumbar region without neurogenic claudication: Secondary | ICD-10-CM | POA: Diagnosis not present

## 2023-01-06 DIAGNOSIS — I5032 Chronic diastolic (congestive) heart failure: Secondary | ICD-10-CM | POA: Diagnosis not present

## 2023-01-06 DIAGNOSIS — D62 Acute posthemorrhagic anemia: Secondary | ICD-10-CM | POA: Diagnosis not present

## 2023-01-06 DIAGNOSIS — I130 Hypertensive heart and chronic kidney disease with heart failure and stage 1 through stage 4 chronic kidney disease, or unspecified chronic kidney disease: Secondary | ICD-10-CM | POA: Diagnosis not present

## 2023-01-06 DIAGNOSIS — M4726 Other spondylosis with radiculopathy, lumbar region: Secondary | ICD-10-CM | POA: Diagnosis not present

## 2023-01-06 DIAGNOSIS — G8929 Other chronic pain: Secondary | ICD-10-CM | POA: Diagnosis not present

## 2023-01-07 DIAGNOSIS — D62 Acute posthemorrhagic anemia: Secondary | ICD-10-CM | POA: Diagnosis not present

## 2023-01-07 DIAGNOSIS — M48061 Spinal stenosis, lumbar region without neurogenic claudication: Secondary | ICD-10-CM | POA: Diagnosis not present

## 2023-01-07 DIAGNOSIS — I130 Hypertensive heart and chronic kidney disease with heart failure and stage 1 through stage 4 chronic kidney disease, or unspecified chronic kidney disease: Secondary | ICD-10-CM | POA: Diagnosis not present

## 2023-01-07 DIAGNOSIS — M4726 Other spondylosis with radiculopathy, lumbar region: Secondary | ICD-10-CM | POA: Diagnosis not present

## 2023-01-07 DIAGNOSIS — I5032 Chronic diastolic (congestive) heart failure: Secondary | ICD-10-CM | POA: Diagnosis not present

## 2023-01-07 DIAGNOSIS — G8929 Other chronic pain: Secondary | ICD-10-CM | POA: Diagnosis not present

## 2023-01-08 DIAGNOSIS — M48061 Spinal stenosis, lumbar region without neurogenic claudication: Secondary | ICD-10-CM | POA: Diagnosis not present

## 2023-01-08 DIAGNOSIS — D62 Acute posthemorrhagic anemia: Secondary | ICD-10-CM | POA: Diagnosis not present

## 2023-01-08 DIAGNOSIS — I130 Hypertensive heart and chronic kidney disease with heart failure and stage 1 through stage 4 chronic kidney disease, or unspecified chronic kidney disease: Secondary | ICD-10-CM | POA: Diagnosis not present

## 2023-01-08 DIAGNOSIS — M4726 Other spondylosis with radiculopathy, lumbar region: Secondary | ICD-10-CM | POA: Diagnosis not present

## 2023-01-08 DIAGNOSIS — I5032 Chronic diastolic (congestive) heart failure: Secondary | ICD-10-CM | POA: Diagnosis not present

## 2023-01-08 DIAGNOSIS — G8929 Other chronic pain: Secondary | ICD-10-CM | POA: Diagnosis not present

## 2023-01-12 ENCOUNTER — Other Ambulatory Visit: Payer: Medicare Other

## 2023-01-12 DIAGNOSIS — M4726 Other spondylosis with radiculopathy, lumbar region: Secondary | ICD-10-CM | POA: Diagnosis not present

## 2023-01-12 DIAGNOSIS — Z86711 Personal history of pulmonary embolism: Secondary | ICD-10-CM | POA: Diagnosis not present

## 2023-01-12 DIAGNOSIS — I130 Hypertensive heart and chronic kidney disease with heart failure and stage 1 through stage 4 chronic kidney disease, or unspecified chronic kidney disease: Secondary | ICD-10-CM | POA: Diagnosis not present

## 2023-01-12 DIAGNOSIS — G8929 Other chronic pain: Secondary | ICD-10-CM | POA: Diagnosis not present

## 2023-01-12 DIAGNOSIS — M48061 Spinal stenosis, lumbar region without neurogenic claudication: Secondary | ICD-10-CM | POA: Diagnosis not present

## 2023-01-12 DIAGNOSIS — D62 Acute posthemorrhagic anemia: Secondary | ICD-10-CM | POA: Diagnosis not present

## 2023-01-12 DIAGNOSIS — I5032 Chronic diastolic (congestive) heart failure: Secondary | ICD-10-CM | POA: Diagnosis not present

## 2023-01-12 LAB — D-DIMER, QUANTITATIVE: D-Dimer, Quant: 4.41 ug{FEU}/mL — ABNORMAL HIGH (ref <0.50)

## 2023-01-14 DIAGNOSIS — D62 Acute posthemorrhagic anemia: Secondary | ICD-10-CM | POA: Diagnosis not present

## 2023-01-14 DIAGNOSIS — M48061 Spinal stenosis, lumbar region without neurogenic claudication: Secondary | ICD-10-CM | POA: Diagnosis not present

## 2023-01-14 DIAGNOSIS — I130 Hypertensive heart and chronic kidney disease with heart failure and stage 1 through stage 4 chronic kidney disease, or unspecified chronic kidney disease: Secondary | ICD-10-CM | POA: Diagnosis not present

## 2023-01-14 DIAGNOSIS — I5032 Chronic diastolic (congestive) heart failure: Secondary | ICD-10-CM | POA: Diagnosis not present

## 2023-01-14 DIAGNOSIS — M4726 Other spondylosis with radiculopathy, lumbar region: Secondary | ICD-10-CM | POA: Diagnosis not present

## 2023-01-14 DIAGNOSIS — G8929 Other chronic pain: Secondary | ICD-10-CM | POA: Diagnosis not present

## 2023-01-21 ENCOUNTER — Other Ambulatory Visit: Payer: Self-pay | Admitting: Internal Medicine

## 2023-01-21 DIAGNOSIS — M48061 Spinal stenosis, lumbar region without neurogenic claudication: Secondary | ICD-10-CM | POA: Diagnosis not present

## 2023-01-21 DIAGNOSIS — J9610 Chronic respiratory failure, unspecified whether with hypoxia or hypercapnia: Secondary | ICD-10-CM | POA: Diagnosis not present

## 2023-01-21 DIAGNOSIS — I700 Atherosclerosis of aorta: Secondary | ICD-10-CM | POA: Diagnosis not present

## 2023-01-21 DIAGNOSIS — G8929 Other chronic pain: Secondary | ICD-10-CM | POA: Diagnosis not present

## 2023-01-21 DIAGNOSIS — M4126 Other idiopathic scoliosis, lumbar region: Secondary | ICD-10-CM | POA: Diagnosis not present

## 2023-01-21 DIAGNOSIS — I5032 Chronic diastolic (congestive) heart failure: Secondary | ICD-10-CM | POA: Diagnosis not present

## 2023-01-21 DIAGNOSIS — D62 Acute posthemorrhagic anemia: Secondary | ICD-10-CM | POA: Diagnosis not present

## 2023-01-21 DIAGNOSIS — M069 Rheumatoid arthritis, unspecified: Secondary | ICD-10-CM | POA: Diagnosis not present

## 2023-01-21 DIAGNOSIS — E1140 Type 2 diabetes mellitus with diabetic neuropathy, unspecified: Secondary | ICD-10-CM | POA: Diagnosis not present

## 2023-01-21 DIAGNOSIS — I2699 Other pulmonary embolism without acute cor pulmonale: Secondary | ICD-10-CM | POA: Diagnosis not present

## 2023-01-21 DIAGNOSIS — I130 Hypertensive heart and chronic kidney disease with heart failure and stage 1 through stage 4 chronic kidney disease, or unspecified chronic kidney disease: Secondary | ICD-10-CM | POA: Diagnosis not present

## 2023-01-21 DIAGNOSIS — J479 Bronchiectasis, uncomplicated: Secondary | ICD-10-CM | POA: Diagnosis not present

## 2023-01-21 DIAGNOSIS — N1831 Chronic kidney disease, stage 3a: Secondary | ICD-10-CM | POA: Diagnosis not present

## 2023-01-21 DIAGNOSIS — Z6838 Body mass index (BMI) 38.0-38.9, adult: Secondary | ICD-10-CM | POA: Diagnosis not present

## 2023-01-21 DIAGNOSIS — J849 Interstitial pulmonary disease, unspecified: Secondary | ICD-10-CM | POA: Diagnosis not present

## 2023-01-21 DIAGNOSIS — M160 Bilateral primary osteoarthritis of hip: Secondary | ICD-10-CM | POA: Diagnosis not present

## 2023-01-21 DIAGNOSIS — E669 Obesity, unspecified: Secondary | ICD-10-CM | POA: Diagnosis not present

## 2023-01-21 DIAGNOSIS — N321 Vesicointestinal fistula: Secondary | ICD-10-CM | POA: Diagnosis not present

## 2023-01-21 DIAGNOSIS — K5790 Diverticulosis of intestine, part unspecified, without perforation or abscess without bleeding: Secondary | ICD-10-CM | POA: Diagnosis not present

## 2023-01-21 DIAGNOSIS — M9983 Other biomechanical lesions of lumbar region: Secondary | ICD-10-CM | POA: Diagnosis not present

## 2023-01-21 DIAGNOSIS — M4726 Other spondylosis with radiculopathy, lumbar region: Secondary | ICD-10-CM | POA: Diagnosis not present

## 2023-01-21 DIAGNOSIS — I272 Pulmonary hypertension, unspecified: Secondary | ICD-10-CM | POA: Diagnosis not present

## 2023-01-21 DIAGNOSIS — M5126 Other intervertebral disc displacement, lumbar region: Secondary | ICD-10-CM | POA: Diagnosis not present

## 2023-01-21 DIAGNOSIS — J8410 Pulmonary fibrosis, unspecified: Secondary | ICD-10-CM | POA: Diagnosis not present

## 2023-01-21 DIAGNOSIS — E1122 Type 2 diabetes mellitus with diabetic chronic kidney disease: Secondary | ICD-10-CM | POA: Diagnosis not present

## 2023-01-21 NOTE — Telephone Encounter (Signed)
Refill for  azithromycin (ZITHROMAX) 250 MG tablet [facility] on [address]

## 2023-01-22 DIAGNOSIS — I5032 Chronic diastolic (congestive) heart failure: Secondary | ICD-10-CM | POA: Diagnosis not present

## 2023-01-22 DIAGNOSIS — D62 Acute posthemorrhagic anemia: Secondary | ICD-10-CM | POA: Diagnosis not present

## 2023-01-22 DIAGNOSIS — I130 Hypertensive heart and chronic kidney disease with heart failure and stage 1 through stage 4 chronic kidney disease, or unspecified chronic kidney disease: Secondary | ICD-10-CM | POA: Diagnosis not present

## 2023-01-22 DIAGNOSIS — M48061 Spinal stenosis, lumbar region without neurogenic claudication: Secondary | ICD-10-CM | POA: Diagnosis not present

## 2023-01-22 DIAGNOSIS — M4726 Other spondylosis with radiculopathy, lumbar region: Secondary | ICD-10-CM | POA: Diagnosis not present

## 2023-01-22 DIAGNOSIS — G8929 Other chronic pain: Secondary | ICD-10-CM | POA: Diagnosis not present

## 2023-01-22 NOTE — Telephone Encounter (Signed)
How many refills would you like to authorize on azithromycin?

## 2023-01-27 NOTE — Progress Notes (Unsigned)
Subjective:    Patient ID: [name], female    DOB: 1941/04/18, 82 y.o.   MRN: [id number]  HPI: [name] is a 82 y.o. female who returns for follow up appointment for chronic pain and medication refill. She states her  pain is located in her bilateral shoulders, lower back pain radiating into her right lower extremity and bilateral knee pain. Generalized joint pain. She rates her pain 5. Her current exercise regime is walking short distances in her home with walker.  Ms. Flier Morphine equivalent is 60.00 MME.   Oral Swab was Performed today.      Pain Inventory Average Pain 7 Pain Right Now 5 My pain is constant, burning, stabbing, tingling, and aching  In the last 24 hours, has pain interfered with the following? General activity 7 Relation with others 7 Enjoyment of life 7 What TIME of day is your pain at its worst? morning  Sleep (in general) Fair  Pain is worse with: walking, bending, and standing Pain improves with: rest, heat/ice, pacing activities, medication, and injections Relief from Meds: 6  Family History  Problem Relation Age of Onset   Diabetes Mother    Heart attack Mother    Hypertension Father    Lung cancer Father    Diabetes Sister    Breast cancer Sister    Breast cancer Sister    Diabetes Brother    Hypertension Brother    Heart disease Brother    Heart attack Brother    Kidney cancer Brother    Gout Brother    Kidney failure Brother        x 5   Esophageal cancer Brother    Stomach cancer Brother    Prostate cancer Brother    Uterine cancer Daughter        with mets   Fibroids Daughter    Hypertension Daughter    Hypertension Daughter    Hypertension Son    Heart murmur Son    Rheum arthritis Maternal Uncle    Alzheimer's disease Neg Hx    Dementia Neg Hx    Social History   Socioeconomic History   Marital status: Married    Spouse name: Not on file   Number of children: 6   Years of education: college    Highest education level: Bachelor's degree (e.g., BA, AB, BS)  Occupational History   Occupation: retired Engineer, civil (consulting)  Tobacco Use   Smoking status: Never   Smokeless tobacco: Never  Vaping Use   Vaping status: Never Used  Substance and Sexual Activity   Alcohol use: No   Drug use: No   Sexual activity: Not Currently  Other Topics Concern   Not on file  Social History Narrative   Patient consumes 2-3 cups coffee per day.   Social Determinants of Health   Financial Resource Strain: Low Risk  (12/24/2022)   Overall Financial Resource Strain (CARDIA)    Difficulty of Paying Living Expenses: Not hard at all  Food Insecurity: No Food Insecurity (12/24/2022)   Hunger Vital Sign    Worried About Running Out of Food in the Last Year: Never true    Ran Out of Food in the Last Year: Never true  Transportation Needs: No Transportation Needs (12/24/2022)   PRAPARE - Administrator, Civil Service (Medical): No    Lack of Transportation (Non-Medical): No  Physical Activity: Unknown (12/24/2022)   Exercise Vital Sign    Days of Exercise per Week:  Patient declined    Minutes of Exercise per Session: 20 min  Recent Concern: Physical Activity - Insufficiently Active (10/27/2022)   Exercise Vital Sign    Days of Exercise per Week: 7 days    Minutes of Exercise per Session: 20 min  Stress: No Stress Concern Present (10/27/2022)   [employer] of Occupational Health - Occupational Stress Questionnaire    Feeling of Stress : Not at all  Social Connections: Socially Integrated (12/24/2022)   Social Connection and Isolation Panel [NHANES]    Frequency of Communication with Friends and Family: More than three times a week    Frequency of Social Gatherings with Friends and Family: Three times a week    Attends Religious Services: More than 4 times per year    Active Member of Clubs or Organizations: Yes    Attends Banker Meetings: 1 to 4 times per year    Marital Status: Married    Past Surgical History:  Procedure Laterality Date   BRAIN SURGERY     Gamma knife 10/13. Needs repeat spring  '14   CARDIAC CATHETERIZATION N/A 10/31/2014   Procedure: Right/Left Heart Cath and Coronary Angiography;  Surgeon: Corky Crafts, MD;  Location: [facility] INVASIVE CV LAB;  Service: Cardiovascular;  Laterality: N/A;   COLONOSCOPY WITH PROPOFOL N/A 04/14/2022   Procedure: COLONOSCOPY WITH PROPOFOL;  Surgeon: Beverley Fiedler, MD;  Location: [facility] ENDOSCOPY;  Service: Gastroenterology;  Laterality: N/A;   CYST EXCISION     2 on face   CYST REMOVAL NECK     ESOPHAGOGASTRODUODENOSCOPY (EGD) WITH PROPOFOL N/A 09/14/2014   Procedure: ESOPHAGOGASTRODUODENOSCOPY (EGD) WITH PROPOFOL;  Surgeon: Louis Meckel, MD;  Location: [facility] ENDOSCOPY;  Service: Endoscopy;  Laterality: N/A;   INCISION AND DRAINAGE HIP Left 01/16/2017   Procedure: IRRIGATION AND DEBRIDEMENT LEFT HIP;  Surgeon: Kathryne Hitch, MD;  Location: [facility] ORS;  Service: Orthopedics;  Laterality: Left;   INTRAMEDULLARY (IM) NAIL INTERTROCHANTERIC Left 11/29/2016   Procedure: INTRAMEDULLARY (IM) NAIL INTERTROCHANTRIC;  Surgeon: Kathryne Hitch, MD;  Location: [facility] OR;  Service: Orthopedics;  Laterality: Left;   OVARY SURGERY     RIGHT HEART CATH N/A 08/12/2021   Procedure: RIGHT HEART CATH;  Surgeon: Runell Gess, MD;  Location: [facility] INVASIVE CV LAB;  Service: Cardiovascular;  Laterality: N/A;   SHOULDER SURGERY Left    TONSILLECTOMY  age 74   VAGINAL HYSTERECTOMY     VIDEO BRONCHOSCOPY Bilateral 05/31/2013   Procedure: VIDEO BRONCHOSCOPY WITHOUT FLUORO;  Surgeon: Kalman Shan, MD;  Location: [facility] ENDOSCOPY;  Service: Cardiopulmonary;  Laterality: Bilateral;   video bronscoscopy  05/19/1998   lung   Past Surgical History:  Procedure Laterality Date   BRAIN SURGERY     Gamma knife 10/13. Needs repeat spring  '14   CARDIAC CATHETERIZATION N/A 10/31/2014   Procedure: Right/Left Heart Cath and Coronary Angiography;   Surgeon: Corky Crafts, MD;  Location: [facility] INVASIVE CV LAB;  Service: Cardiovascular;  Laterality: N/A;   COLONOSCOPY WITH PROPOFOL N/A 04/14/2022   Procedure: COLONOSCOPY WITH PROPOFOL;  Surgeon: Beverley Fiedler, MD;  Location: [facility] ENDOSCOPY;  Service: Gastroenterology;  Laterality: N/A;   CYST EXCISION     2 on face   CYST REMOVAL NECK     ESOPHAGOGASTRODUODENOSCOPY (EGD) WITH PROPOFOL N/A 09/14/2014   Procedure: ESOPHAGOGASTRODUODENOSCOPY (EGD) WITH PROPOFOL;  Surgeon: Louis Meckel, MD;  Location: [facility] ENDOSCOPY;  Service: Endoscopy;  Laterality: N/A;   INCISION AND DRAINAGE HIP Left 01/16/2017  Procedure: IRRIGATION AND DEBRIDEMENT LEFT HIP;  Surgeon: Kathryne Hitch, MD;  Location: [facility] ORS;  Service: Orthopedics;  Laterality: Left;   INTRAMEDULLARY (IM) NAIL INTERTROCHANTERIC Left 11/29/2016   Procedure: INTRAMEDULLARY (IM) NAIL INTERTROCHANTRIC;  Surgeon: Kathryne Hitch, MD;  Location: [facility] OR;  Service: Orthopedics;  Laterality: Left;   OVARY SURGERY     RIGHT HEART CATH N/A 08/12/2021   Procedure: RIGHT HEART CATH;  Surgeon: Runell Gess, MD;  Location: [facility] INVASIVE CV LAB;  Service: Cardiovascular;  Laterality: N/A;   SHOULDER SURGERY Left    TONSILLECTOMY  age 61   VAGINAL HYSTERECTOMY     VIDEO BRONCHOSCOPY Bilateral 05/31/2013   Procedure: VIDEO BRONCHOSCOPY WITHOUT FLUORO;  Surgeon: Kalman Shan, MD;  Location: [facility] ENDOSCOPY;  Service: Cardiopulmonary;  Laterality: Bilateral;   video bronscoscopy  05/19/1998   lung   Past Medical History:  Diagnosis Date   Acute on chronic diastolic (congestive) heart failure (HCC)    Anemia    iron deficiency anemia - secondary to blood loss ( chronic)    Anxiety    Arthritis    endstage changes bilateral knees/bilateral ankles.    Asthma    Carotid artery occlusion    Chronic fatigue    Chronic kidney disease    Closed left hip fracture (HCC)    Clotting disorder (HCC)    pt denies this   Contusion of left  knee    due to fall 1/14.   COPD (chronic obstructive pulmonary disease) (HCC)    pulmonary fibrosis   Depression, reactive    Diabetes mellitus    type II    Diastolic dysfunction    Difficulty in walking    Family history of heart disease    Generalized muscle weakness    Gout    High cholesterol    History of falling    Hypertension    Hypothyroidism    Interstitial lung disease (HCC)    Meningioma of left sphenoid wing involving cavernous sinus (HCC) 02/17/2012   Continue diplopia, left eye pain and left headaches.     Morbid obesity (HCC)    Neuromuscular disorder (HCC)    diabetic neuropathy    Normal coronary arteries    cardiac catheterization performed  10/31/14   Pneumonia    RA (rheumatoid arthritis) (HCC)    has been off methotreaxte since 10/13.   Spinal stenosis of lumbar region    Thyroid disease    Unspecified lack of coordination    URI (upper respiratory infection)    BP 136/85   Pulse (!) 101   Ht 5\' 4"  (1.626 m)   Wt 226 lb (102.5 kg)   BMI 38.79 kg/m   Opioid Risk Score:   Fall Risk Score:  `1  Depression screen [facility] 2/9     01/28/2023   11:23 AM 11/27/2022   11:17 AM 10/27/2022    1:46 PM 09/22/2022   11:27 AM 09/03/2022   10:10 AM 07/28/2022    1:29 PM 06/02/2022   10:12 AM  Depression screen PHQ 2/9  Decreased Interest 0 1 0 0 0 0 0  Down, Depressed, Hopeless 0 1 0 0 1 0 0  PHQ - 2 Score 0 2 0 0 1 0 0  Altered sleeping   0  1    Tired, decreased energy   0  1    Change in appetite   0  0    Feeling bad or failure about yourself  0  1    Trouble concentrating   0  0    Moving slowly or fidgety/restless   0  1    Suicidal thoughts   0  0    PHQ-9 Score   0  5      Review of Systems  Musculoskeletal:  Positive for back pain and gait problem.       Pain in both knees, left wrist, right arm, right upper leg, both shoulders  All other systems reviewed and are negative.      Objective:   Physical Exam Vitals and nursing note  reviewed.  Constitutional:      Appearance: Normal appearance.  Cardiovascular:     Rate and Rhythm: Normal rate and regular rhythm.     Pulses: Normal pulses.     Heart sounds: Normal heart sounds.  Pulmonary:     Effort: Pulmonary effort is normal.     Breath sounds: Normal breath sounds.     Comments: Continuous Oxygen @ 3 liters nasal cannula  Musculoskeletal:     Cervical back: Normal range of motion and neck supple.     Comments: Normal Muscle Bulk and Muscle Testing Reveals:  Upper Extremities: Right: Decreased ROM  90 Degrees and Muscle Strength 5/5 Left Upper Extremity: Full ROM and Muscle Strength 5/5 Bilateral AC Joint Tenderness Thoracic and  Lumbar Hypersensitivity Lower Extremities: Right: Decreased ROM and Muscle Strength 5/5 Right Lower Extremity Flexion Produces Pain into his Right Patella Left Lower Extremity Full ROM and Muscle Strength 5/5 Arrived in wheelchair      Skin:    General: Skin is warm and dry.  Neurological:     Mental Status: She is alert and oriented to person, place, and time.  Psychiatric:        Mood and Affect: Mood normal.        Behavior: Behavior normal.         Assessment & Plan:  1. Lumbar spinal stenosis with neurogenic claudication. Associated facet arthropathy/ Lumbar Radiculitis: Continue current medication regimen with Gabapentin and HEP as tolerated. 11/27/2022 Refilled: MS Contin 15 mg one tablet every 12 hours #60 and  MSIR 15 one tablet twice a day as needed  For break through pain ( 6 am and 1:00 pm)  #60.since adverse effect with insomnia with Nucynta. Nucynta discontinued due to insomnia.  We will continue the opioid monitoring program, this consists of regular clinic visits, examinations, urine drug screen, pill counts as well as use of [place] Controlled Substance Reporting system. A 12 month History has been reviewed on the [place] Controlled Substance Reporting System 01/28/2023 2. Chronic Bilateral  Thoracic Back Pain: Continue HEP as Tolerated. Continue to Monitor. 01/28/2023 3 Depression: Continue current medication Regime: Cymbalta . Continue to monitor.01/28/2023 4. Diabetes mellitus type 2 with polyneuropathy: Continue current medication regime: Gabapentin. 01/28/2023 5. Rheumatoid arthritis and osteoarthritis. Continue current medication Regime.  Voltaren Gel. Rheumatology Following. 01/28/2023 6. Interstitial lung disease: Pulmonology Following. 01/28/2023. 7. Bilateral Osteoarthritis Knee's:  Continue current medication regimen. Voltaren Gel. 01/28/2023. 8. Muscle Spasm: Continue current medication regime : Flexeril. Continue to monitor.01/28/2023 9. Bilateral Greater Trochanteric Tenderness: Continue current treatment with Ice/Heat Therapy. 01/28/2023. 10. Polyarthralgia: Rheumatology Following: Continue to monitor.01/28/2023 11. Morbid Obesitity: Continue Healthy Diet Regime and HEP. Continue to monitor.  01/28/2023 12. Cervicalgia/Cervical Radiculitis: No complaints today. Continue to Alternate with Heat and Ice Therapy. Continue to monitor. 01/28/2023  13. Chronic Bilateral Shoulder Pain: Continue HEP as Tolerated. Continue current  medication regimen. Continue to monitor.     F/U in 2 months

## 2023-01-28 ENCOUNTER — Encounter: Payer: Self-pay | Admitting: Registered Nurse

## 2023-01-28 ENCOUNTER — Encounter: Payer: Medicare Other | Attending: Physical Medicine & Rehabilitation | Admitting: Registered Nurse

## 2023-01-28 VITALS — BP 136/85 | HR 100 | Ht 64.0 in | Wt 226.0 lb

## 2023-01-28 DIAGNOSIS — M7062 Trochanteric bursitis, left hip: Secondary | ICD-10-CM

## 2023-01-28 DIAGNOSIS — M25512 Pain in left shoulder: Secondary | ICD-10-CM | POA: Diagnosis not present

## 2023-01-28 DIAGNOSIS — Z5181 Encounter for therapeutic drug level monitoring: Secondary | ICD-10-CM | POA: Diagnosis not present

## 2023-01-28 DIAGNOSIS — M7061 Trochanteric bursitis, right hip: Secondary | ICD-10-CM | POA: Diagnosis not present

## 2023-01-28 DIAGNOSIS — M4726 Other spondylosis with radiculopathy, lumbar region: Secondary | ICD-10-CM | POA: Diagnosis not present

## 2023-01-28 DIAGNOSIS — M25511 Pain in right shoulder: Secondary | ICD-10-CM | POA: Diagnosis not present

## 2023-01-28 DIAGNOSIS — G8929 Other chronic pain: Secondary | ICD-10-CM | POA: Diagnosis not present

## 2023-01-28 DIAGNOSIS — M2550 Pain in unspecified joint: Secondary | ICD-10-CM

## 2023-01-28 DIAGNOSIS — M5416 Radiculopathy, lumbar region: Secondary | ICD-10-CM | POA: Diagnosis not present

## 2023-01-28 DIAGNOSIS — G894 Chronic pain syndrome: Secondary | ICD-10-CM

## 2023-01-28 DIAGNOSIS — I130 Hypertensive heart and chronic kidney disease with heart failure and stage 1 through stage 4 chronic kidney disease, or unspecified chronic kidney disease: Secondary | ICD-10-CM | POA: Diagnosis not present

## 2023-01-28 DIAGNOSIS — M48061 Spinal stenosis, lumbar region without neurogenic claudication: Secondary | ICD-10-CM | POA: Diagnosis not present

## 2023-01-28 DIAGNOSIS — Z79891 Long term (current) use of opiate analgesic: Secondary | ICD-10-CM | POA: Diagnosis not present

## 2023-01-28 DIAGNOSIS — M170 Bilateral primary osteoarthritis of knee: Secondary | ICD-10-CM

## 2023-01-28 DIAGNOSIS — D62 Acute posthemorrhagic anemia: Secondary | ICD-10-CM | POA: Diagnosis not present

## 2023-01-28 DIAGNOSIS — I5032 Chronic diastolic (congestive) heart failure: Secondary | ICD-10-CM | POA: Diagnosis not present

## 2023-01-28 MED ORDER — MORPHINE SULFATE ER 15 MG PO TBCR: 15.0000 mg | EXTENDED_RELEASE_TABLET | Freq: Two times a day (BID) | ORAL | 0 refills | Status: DC

## 2023-01-28 MED ORDER — MORPHINE SULFATE 15 MG PO TABS: 15.0000 mg | ORAL_TABLET | Freq: Two times a day (BID) | ORAL | 0 refills | Status: DC

## 2023-01-29 ENCOUNTER — Telehealth: Payer: Self-pay | Admitting: Internal Medicine

## 2023-01-29 ENCOUNTER — Telehealth: Payer: Self-pay | Admitting: Neurology

## 2023-01-29 MED ORDER — AZITHROMYCIN 250 MG PO TABS: 250.0000 mg | ORAL_TABLET | ORAL | 11 refills | Status: DC

## 2023-01-29 NOTE — Telephone Encounter (Signed)
Ok usually we have patients try Aricept in the AM if this happens.

## 2023-01-29 NOTE — Telephone Encounter (Signed)
I spoke to pt and informed her I have sent azithromycin (ZITHROMAX) 250 MG tablet to the [facility] for her. Pt verbalized understanding. Nothing further needed.

## 2023-01-29 NOTE — Telephone Encounter (Signed)
PT calling because she needs refill of the following:  azithromycin (ZITHROMAX) 250 MG tablet [[id number]]   States she the Dr. at  [facility] had her on it for 2 years but Dr. Marchelle Gearing is her current provider.  Pharm is [facility] on [address].   Sending High Priority because it is an Antibx  Also, may need Pred to keep on hand if possible. 10-12 years has been on it.

## 2023-01-29 NOTE — Telephone Encounter (Signed)
I spoke with the patient.  She states she tried Aricept during the day as well and it did not help the nightmares, but they did resolve after stopping the medication altogether.  I advised that Dr. Lucia Gaskins may potentially suggest Namenda next.  The patient thanked me for the call and will wait for our call back after it has been reviewed by Dr. Lucia Gaskins.

## 2023-01-29 NOTE — Telephone Encounter (Signed)
Pt said couple of weeks ago started having nightmares as reaction to donepezil (ARICEPT) 10 MG tablet. Discontinued medication last weekend. Would like call back.

## 2023-01-29 NOTE — Telephone Encounter (Signed)
D-dimer going up after coming of eliquis in setting of Gi bleed  Plan  - She is at high risk of having clot -> if no furhter bleeding, and GI ok with it I Recommend low dose eliquis protocol -> pls ask   A) ehr if she has had any further gi bleeding B) is she still on eliquis C) if not on eliquis ask her to ask her GI doc if she can take low dose   Latest Reference Range & Units 04/17/15 18:02 04/18/15 13:30 02/10/19 15:02 10/29/21 11:15 12/22/22 12:08 01/12/23 11:14  D-Dimer, Quant <0.50 mcg/mL FEU 2.57 (H) 2.54 (H) 0.39  2.65 (H) 4.41 (H)  D-dimer 0.00 - 0.49 mg/L FEU    4.22 (H)    (H): Data is abnormally high   Latest Reference Range & Units 01/27/12 22:25 01/29/12 02:53 02/17/12 13:37 05/14/12 00:10 07/12/12 10:51 07/13/12 06:55 07/14/12 07:00 07/16/12 08:50 07/22/12 05:55 03/20/13 15:38 06/06/14 12:50 06/21/14 11:30 10/30/14 17:25 10/31/14 04:15 11/01/14 02:46 11/08/14 09:55 11/16/14 08:32 01/27/15 10:12 04/17/15 15:57 04/18/15 05:30 04/18/15 13:30 04/19/15 03:20 04/20/15 06:20 04/21/15 09:55 04/30/15 00:00 04/14/16 13:08 09/17/16 14:38 11/29/16 09:28 11/30/16 03:03 12/01/16 03:23 01/16/17 18:39 01/17/17 05:41 01/18/17 04:53 01/19/17 04:14 01/20/17 04:43 01/21/17 04:36 02/22/18 12:56 05/26/18 12:59 06/02/18 13:06 06/03/18 04:35 06/04/18 10:40 06/05/18 05:35 06/11/18 16:04 04/21/19 15:39 04/22/19 05:46 04/24/19 05:22 04/24/19 11:24 04/25/19 05:11 04/26/19 04:17 04/27/19 03:36 04/28/19 03:09 05/12/19 13:13 09/22/19 11:44 09/26/19 11:05 11/10/19 20:52 08/01/20 00:00 09/26/20 13:55 11/08/20 11:22 01/16/21 15:35 01/16/21 15:57 01/17/21 03:56 01/18/21 03:24 01/19/21 02:11 01/24/21 11:56 03/30/21 12:00 03/31/21 06:39 04/01/21 00:41 04/02/21 00:56 05/22/21 15:43 09/25/21 16:07 10/29/21 11:15 02/01/22 15:05 02/02/22 18:16 03/01/22 14:43 06/26/22 11:50 09/29/22 09:21 11/28/22 10:50 12/15/22 13:46 12/17/22 08:23 12/18/22 03:54  Creatinine 0.44 - 1.00 mg/dL 0.98 1.19 1.7 (H) 1.47 (H) 0.98 0.86 0.90 1.27  (H) 1.31 (H) 0.94 1.07 1.22 (H) 1.65 (H) 1.36 (H) 1.24 (H) 1.95 (H) 1.39 (H) 1.58 (H) 2.84 (H) 2.01 (H) 1.69 (H) 1.35 (H) 1.23 (H) 1.17 (H) 1.2 ! (E) 1.25 (H) 1.57 (H) 1.36 (H) 1.43 (H) 1.28 (H) 1.10 (H) 1.05 (H) 1.30 (H) 1.24 (H) 1.22 (H) 1.21 (H) 1.19 (H) 1.16 (H) 1.43 (H) 1.18 (H) 1.14 (H) 1.06 (H) 1.22 (H) 2.18 (H) 2.75 (H) 0.89 0.77 0.82 0.78 0.88 0.92 0.78 0.94 1.33 (H) 1.22 (H) 1.0 (E) 0.97 0.96 1.61 (H) 1.60 (H) 1.05 (H) 0.78 0.79 0.78 2.22 (H) 0.88 0.67 0.79 0.82 0.87 0.84 0.97 0.93 0.72 0.91 0.95 0.98 0.85 0.66 0.84  (H): Data is abnormally high !: Data is abnormal (E): External lab result   Latest Reference Range & Units 01/27/12 22:25 01/29/12 02:53 02/17/12 13:37 05/13/12 23:57 05/14/12 00:10 07/12/12 10:51 07/13/12 06:55 07/14/12 07:00 07/16/12 08:50 03/20/13 15:38 06/21/14 11:30 10/31/14 04:15 11/01/14 02:46 11/02/14 05:34 01/27/15 10:12 04/17/15 15:57 04/18/15 05:30 04/19/15 03:20 04/20/15 06:20 04/30/15 00:00 04/14/16 13:08 09/17/16 14:38 11/29/16 09:28 11/30/16 03:03 12/01/16 03:23 12/02/16 05:18 01/16/17 13:36 01/17/17 05:41 01/18/17 04:53 01/19/17 04:14 01/20/17 04:41 01/21/17 04:36 02/22/18 12:56 06/02/18 13:06 06/03/18 04:35 06/04/18 10:40 06/05/18 05:35 04/21/19 15:39 04/22/19 05:46 04/23/19 06:47 04/24/19 05:22 04/25/19 05:11 04/26/19 04:17 04/27/19 03:36 04/28/19 03:09 05/12/19 13:13 09/22/19 11:44 11/10/19 20:52 09/26/20 13:55 11/08/20 11:22 01/16/21 15:35 01/16/21 15:57 01/16/21 16:12 01/17/21 03:56 01/18/21 03:24 01/19/21 02:11 01/24/21 11:56 01/30/21 14:44 03/30/21 12:00 03/30/21 12:48 03/30/21 16:38 03/31/21 06:39 04/01/21 00:41 04/02/21 00:56 05/22/21 15:43 08/12/21 07:53 09/25/21 16:07 10/29/21 11:15 02/01/22 15:05 02/02/22 18:16 03/01/22 14:43 11/28/22 10:50  12/15/22 13:46 12/16/22 06:28 12/16/22 14:21 12/16/22 22:12 12/17/22 08:23 12/18/22 03:54 12/22/22 12:08  Hemoglobin 12.0 - 15.0 g/dL 16.1 09.6 04.5 40.9 (L) 12.9 10.1 (L) 10.6 (L) 11.1 (L) 12.2 13.0 12.7 12.1 11.6 (L) 11.4  (L) 12.4 11.9 (L) 10.7 (L) 11.0 (L) 10.6 (L) 13.0 (E) 10.8 (L) 12.1 11.6 (L) 9.1 (L) 8.3 (L) 7.9 (L) 11.2 (L) 7.2 (L) 8.4 (L) 8.7 (L) 8.4 (L) 8.6 (L) 11.7 12.2 11.2 (L) 11.3 (L) 11.0 (L) 12.8 11.6 (L) 11.4 (L) 11.4 (L) 11.8 (L) 11.5 (L) 11.5 (L) 11.2 (L) 11.9 (L) 12.7 12.4 12.1 12.1 13.5 15.0 12.6 13.1 11.4 (L) 10.4 (L) 11.8 (L) 12.3 12.3 12.9 11.9 (L) 11.2 (L) 11.1 (L) 11.0 (L) 11.7 (L) 12.6 12.6 11.9 (L) 12.2 13.3 13.5 12.0 12.1 10.5 (L) 9.5 (L) 9.1 (L) 9.0 (L) 9.7 (L) 9.0 (L) 9.9 (L)  (L): Data is abnormally low (E): External lab result

## 2023-02-01 LAB — DRUG TOX MONITOR 1 W/CONF, ORAL FLD
Amphetamines: NEGATIVE ng/mL (ref <10)
Barbiturates: NEGATIVE ng/mL (ref <10)
Benzodiazepines: NEGATIVE ng/mL (ref <0.50)
Buprenorphine: NEGATIVE ng/mL (ref <0.10)
Cocaine: NEGATIVE ng/mL (ref <5.0)
Codeine: NEGATIVE ng/mL (ref <2.5)
Dihydrocodeine: NEGATIVE ng/mL (ref <2.5)
Fentanyl: NEGATIVE ng/mL (ref <0.10)
Heroin Metabolite: NEGATIVE ng/mL (ref <1.0)
Hydrocodone: NEGATIVE ng/mL (ref <2.5)
Hydromorphone: NEGATIVE ng/mL (ref <2.5)
MARIJUANA: NEGATIVE ng/mL (ref <2.5)
MDMA: NEGATIVE ng/mL (ref <10)
Meprobamate: NEGATIVE ng/mL (ref <2.5)
Methadone: NEGATIVE ng/mL (ref <5.0)
Morphine: 41.1 ng/mL — ABNORMAL HIGH (ref <2.5)
Nicotine Metabolite: NEGATIVE ng/mL (ref <5.0)
Norhydrocodone: NEGATIVE ng/mL (ref <2.5)
Noroxycodone: NEGATIVE ng/mL (ref <2.5)
Opiates: POSITIVE ng/mL — AB (ref <2.5)
Oxycodone: NEGATIVE ng/mL (ref <2.5)
Oxymorphone: NEGATIVE ng/mL (ref <2.5)
Phencyclidine: NEGATIVE ng/mL (ref <10)
Tapentadol: NEGATIVE ng/mL (ref <5.0)
Tramadol: NEGATIVE ng/mL (ref <5.0)
Zolpidem: NEGATIVE ng/mL (ref <5.0)

## 2023-02-01 LAB — DRUG TOX ALC METAB W/CON, ORAL FLD: Alcohol Metabolite: NEGATIVE ng/mL (ref <25)

## 2023-02-02 NOTE — Telephone Encounter (Signed)
If she is aenable we can try memantine/namenda but I very much like patients to have all the side effects to the medication. If she is on mychart we can send the mychart medication description with all the side effects and information via mychart message and then prescribe (start one pill at bedtome and in 2 weeks increase to one pill twice daily) thanks

## 2023-02-03 NOTE — Telephone Encounter (Signed)
Spoke with patient and she is aware of D-dimer results and is back on Eliquis.  Patient says she is doing well.

## 2023-02-03 NOTE — Telephone Encounter (Signed)
Dr Marchelle Gearing spoke with patient.  See other encounter.

## 2023-02-04 NOTE — Telephone Encounter (Signed)
I called the patient and discussed the message below by Dr. Lucia Gaskins.  Patient is amenable to having memantine information sent to her on MyChart and she will reply to the message to let us know if she is okay with proceeding.  We discussed that if she is, she will start with 1 pill at bedtime and then 2 weeks later if she is doing well, she will increase to 1 pill twice daily.  She verbalized appreciation for the call.

## 2023-02-13 ENCOUNTER — Other Ambulatory Visit: Payer: Self-pay | Admitting: Cardiovascular Disease

## 2023-02-13 DIAGNOSIS — I10 Essential (primary) hypertension: Secondary | ICD-10-CM

## 2023-03-02 ENCOUNTER — Encounter: Payer: Self-pay | Admitting: Family Medicine

## 2023-03-02 ENCOUNTER — Ambulatory Visit: Payer: Medicare Other | Admitting: Family Medicine

## 2023-03-02 VITALS — BP 134/86 | HR 117 | Temp 97.8°F | Ht 64.0 in | Wt 227.4 lb

## 2023-03-02 DIAGNOSIS — G4709 Other insomnia: Secondary | ICD-10-CM

## 2023-03-02 DIAGNOSIS — E1140 Type 2 diabetes mellitus with diabetic neuropathy, unspecified: Secondary | ICD-10-CM | POA: Diagnosis not present

## 2023-03-02 DIAGNOSIS — J8410 Pulmonary fibrosis, unspecified: Secondary | ICD-10-CM

## 2023-03-02 DIAGNOSIS — Z23 Encounter for immunization: Secondary | ICD-10-CM

## 2023-03-02 DIAGNOSIS — R600 Localized edema: Secondary | ICD-10-CM | POA: Diagnosis not present

## 2023-03-02 DIAGNOSIS — I5032 Chronic diastolic (congestive) heart failure: Secondary | ICD-10-CM

## 2023-03-02 DIAGNOSIS — N183 Chronic kidney disease, stage 3 unspecified: Secondary | ICD-10-CM

## 2023-03-02 DIAGNOSIS — Z7985 Long-term (current) use of injectable non-insulin antidiabetic drugs: Secondary | ICD-10-CM | POA: Diagnosis not present

## 2023-03-02 NOTE — Progress Notes (Signed)
Established Patient Office Visit   Subjective  Patient ID: [name], female    DOB: 19-Apr-1941  Age: 82 y.o. MRN: [id number]  Chief Complaint  Patient presents with   Medical Management of Chronic Issues    Lower Back pain follow-up, Right hip and leg, doing better this visit, rate of pain 5 out of 10,   Pt accompanied by her daughter, [name].  Pt is an 82 yo female seen for f/u on chronic conditions.  Pt states she has been doing better.  R hip and leg pain 5/10.  Doing leg exercises while sitting.  Having some difficulty sleeping in the last few wks after finding out her brother in [place] is not doing well.  Pt would like to travel to visit him, but family is hesitant.  Pt not wearing O2 today due to rushing.  States is ok if sitting but has more SOB with exertion.  Typically wears continuous O2 2-3 L via [place] in house and at night.  Seen by Pulmonology.  Pt also followed by Cardiology and Nephrology.  BS controlled.  Since LE edema has improved shoes are too big.  Inquires about seeing Podiatry from new diabetic shoes.    Patient Active Problem List   Diagnosis Date Noted   Lower GI bleed 12/16/2022   BRBPR (bright red blood per rectum) 12/15/2022   [name] 09/08/2022   Abnormal CT scan, sigmoid colon 04/14/2022   Colon stricture (HCC) 04/14/2022   Diverticulosis of colon without hemorrhage 04/14/2022   Pulmonary embolism (HCC) 11/15/2021   Tachycardia 10/22/2021   Pulmonary HTN (HCC)    Unspecified protein-calorie malnutrition (HCC) 04/05/2021   Pressure injury of skin 03/31/2021   UTI (urinary tract infection) 03/31/2021   Acute encephalopathy 03/30/2021   Thrombocytopenia (HCC) 03/30/2021   Diabetic neuropathy (HCC) 05/24/2019   Hypokalemia 04/21/2019   Anticoagulated 06/25/2018   Chronic pain 06/25/2018   CRI (chronic renal insufficiency), stage 3 (moderate) (HCC) 06/25/2018   Drug-induced constipation 06/18/2018   DVT  (deep venous thrombosis) (HCC) 06/03/2018   Primary osteoarthritis of both knees 05/26/2018   Body mass index (BMI) of 50-59.9 in adult (HCC) 05/26/2018   Acquired hypothyroidism 05/26/2018   Chronic bilateral low back pain without sciatica 02/17/2018   Astigmatism with presbyopia, bilateral 03/26/2017   Cortical age-related cataract of both eyes 03/26/2017   Family history of glaucoma 03/26/2017   Long term current use of oral hypoglycemic drug 03/26/2017   Nuclear sclerotic cataract of both eyes 03/26/2017   Pain in left hip 02/18/2017   Closed nondisplaced intertrochanteric fracture of left femur with delayed healing 01/14/2017   Gout 11/29/2016   Depression 11/29/2016   Closed left hip fracture (HCC) 11/29/2016   GERD (gastroesophageal reflux disease) 11/29/2016   Closed intertrochanteric fracture of hip, left, initial encounter (HCC) 11/29/2016   Physical deconditioning 07/31/2015   Pulmonary fibrosis, postinflammatory (HCC) 07/31/2015   Bronchiectasis without complication (HCC) 07/31/2015   Tendinitis of left rotator cuff 06/13/2015   Chronic diastolic CHF (congestive heart failure) (HCC) 04/21/2015   Acute kidney injury superimposed on chronic kidney disease (HCC) 04/17/2015   Chronic respiratory failure with hypoxia (HCC) 02/09/2015   Dyspnea and respiratory abnormality 01/03/2015   Normal coronary arteries 11/02/2014   Morbid obesity (HCC) 10/30/2014   Dysphagia 08/21/2014   Chronic fatigue 06/21/2014   DOE (dyspnea on exertion) 05/24/2014   Primary osteoarthritis of left knee 04/26/2014   High risk medication use 04/17/2014  Aphasia 02/12/2014   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) 01/18/2014   Lumbar facet arthropathy 01/18/2014   Bilateral edema of lower extremity 05/30/2013   Chronic cough 05/24/2013   ILD (interstitial lung disease) (HCC) 04/10/2013   Spinal stenosis of lumbar region with radiculopathy 07/12/2012   Inability to walk 07/12/2012    Meningioma of left sphenoid wing involving cavernous sinus (HCC) 02/17/2012   Chest pain of uncertain etiology 01/28/2012   Rheumatoid arthritis (HCC) 01/28/2012   Primary osteoarthritis of right knee 01/28/2012   Dyslipidemia    Thyroid disease    Essential hypertension    Past Medical History:  Diagnosis Date   Acute on chronic diastolic (congestive) heart failure (HCC)    Anemia    iron deficiency anemia - secondary to blood loss ( chronic)    Anxiety    Arthritis    endstage changes bilateral knees/bilateral ankles.    Asthma    Carotid artery occlusion    Chronic fatigue    Chronic kidney disease    Closed left hip fracture (HCC)    Clotting disorder (HCC)    pt denies this   Contusion of left knee    due to fall 1/14.   COPD (chronic obstructive pulmonary disease) (HCC)    pulmonary fibrosis   Depression, reactive    Diabetes mellitus    type II    Diastolic dysfunction    Difficulty in walking    Family history of heart disease    Generalized muscle weakness    Gout    High cholesterol    History of falling    Hypertension    Hypothyroidism    Interstitial lung disease (HCC)    Meningioma of left sphenoid wing involving cavernous sinus (HCC) 02/17/2012   Continue diplopia, left eye pain and left headaches.     Morbid obesity (HCC)    Neuromuscular disorder (HCC)    diabetic neuropathy    Normal coronary arteries    cardiac catheterization performed  10/31/14   Pneumonia    RA (rheumatoid arthritis) (HCC)    has been off methotreaxte since 10/13.   Spinal stenosis of lumbar region    Thyroid disease    Unspecified lack of coordination    URI (upper respiratory infection)    Past Surgical History:  Procedure Laterality Date   BRAIN SURGERY     Gamma knife 10/13. Needs repeat spring  '14   CARDIAC CATHETERIZATION N/A 10/31/2014   Procedure: Right/Left Heart Cath and Coronary Angiography;  Surgeon: Corky Crafts, MD;  Location: [facility] INVASIVE CV  LAB;  Service: Cardiovascular;  Laterality: N/A;   COLONOSCOPY WITH PROPOFOL N/A 04/14/2022   Procedure: COLONOSCOPY WITH PROPOFOL;  Surgeon: Beverley Fiedler, MD;  Location: [facility] ENDOSCOPY;  Service: Gastroenterology;  Laterality: N/A;   CYST EXCISION     2 on face   CYST REMOVAL NECK     ESOPHAGOGASTRODUODENOSCOPY (EGD) WITH PROPOFOL N/A 09/14/2014   Procedure: ESOPHAGOGASTRODUODENOSCOPY (EGD) WITH PROPOFOL;  Surgeon: Louis Meckel, MD;  Location: [facility] ENDOSCOPY;  Service: Endoscopy;  Laterality: N/A;   INCISION AND DRAINAGE HIP Left 01/16/2017   Procedure: IRRIGATION AND DEBRIDEMENT LEFT HIP;  Surgeon: Kathryne Hitch, MD;  Location: [facility] ORS;  Service: Orthopedics;  Laterality: Left;   INTRAMEDULLARY (IM) NAIL INTERTROCHANTERIC Left 11/29/2016   Procedure: INTRAMEDULLARY (IM) NAIL INTERTROCHANTRIC;  Surgeon: Kathryne Hitch, MD;  Location: [facility] OR;  Service: Orthopedics;  Laterality: Left;   OVARY SURGERY  RIGHT HEART CATH N/A 08/12/2021   Procedure: RIGHT HEART CATH;  Surgeon: Runell Gess, MD;  Location: [facility] INVASIVE CV LAB;  Service: Cardiovascular;  Laterality: N/A;   SHOULDER SURGERY Left    TONSILLECTOMY  age 5   VAGINAL HYSTERECTOMY     VIDEO BRONCHOSCOPY Bilateral 05/31/2013   Procedure: VIDEO BRONCHOSCOPY WITHOUT FLUORO;  Surgeon: Kalman Shan, MD;  Location: [facility] ENDOSCOPY;  Service: Cardiopulmonary;  Laterality: Bilateral;   video bronscoscopy  05/19/1998   lung   Social History   Tobacco Use   Smoking status: Never   Smokeless tobacco: Never  Vaping Use   Vaping status: Never Used  Substance Use Topics   Alcohol use: No   Drug use: No   Family History  Problem Relation Age of Onset   Diabetes Mother    Heart attack Mother    Hypertension Father    Lung cancer Father    Diabetes Sister    Breast cancer Sister    Breast cancer Sister    Diabetes Brother    Hypertension Brother    Heart disease Brother    Heart attack Brother    Kidney  cancer Brother    Gout Brother    Kidney failure Brother        x 5   Esophageal cancer Brother    Stomach cancer Brother    Prostate cancer Brother    Uterine cancer Daughter        with mets   Fibroids Daughter    Hypertension Daughter    Hypertension Daughter    Hypertension Son    Heart murmur Son    Rheum arthritis Maternal Uncle    Alzheimer's disease Neg Hx    Dementia Neg Hx    Allergies  Allergen Reactions   Codeine Swelling and Other (See Comments)    Facial swelling, Chest pain, and swelling in legs    Infliximab Anaphylaxis    (REMICADE) "sent me into shock"    Lisinopril Swelling and Rash    Face and neck swelling    Ofev [Nintedanib] Diarrhea      ROS Negative unless stated above    Objective:     BP 134/86 (BP Location: Left Arm, Patient Position: Sitting, Cuff Size: Large)   Pulse (!) 117   Temp 97.8 F (36.6 C) (Oral)   Ht 5\' 4"  (1.626 m)   Wt 227 lb 6.4 oz (103.1 kg)   SpO2 (!) 88% Comment: patient did not bring o2 with her to the appointment  BMI 39.03 kg/m  BP Readings from Last 3 Encounters:  03/02/23 134/86  01/28/23 136/85  01/01/23 (!) 154/97   Wt Readings from Last 3 Encounters:  03/02/23 227 lb 6.4 oz (103.1 kg)  01/28/23 226 lb (102.5 kg)  12/24/22 223 lb (101.2 kg)      Physical Exam Constitutional:      General: She is not in acute distress.    Appearance: Normal appearance.     Comments: Not currently on O2  HENT:     Head: Normocephalic and atraumatic.     Nose: Nose normal.     Mouth/Throat:     Mouth: Mucous membranes are moist.  Eyes:     Extraocular Movements: Extraocular movements intact.     Conjunctiva/sclera: Conjunctivae normal.  Cardiovascular:     Rate and Rhythm: Normal rate and regular rhythm.     Heart sounds: Normal heart sounds. No murmur heard.    No gallop.  Comments: Trace edema bilateral feet to mid shin, R>L. Pulmonary:     Effort: Pulmonary effort is normal. No respiratory distress.      Breath sounds: Normal breath sounds. No wheezing, rhonchi or rales.  Abdominal:     Palpations: Abdomen is soft.  Skin:    General: Skin is warm and dry.     Comments: Shoes loose fitting.  Hyperpigmented area on the right foot.  Neurological:     Mental Status: She is alert and oriented to person, place, and time. Mental status is at baseline.     Cranial Nerves: Cranial nerves 2-12 are intact.     Comments: Sitting in transport wheelchair.  Gait not assessed.    Diabetic Foot Exam - Simple   Simple Foot Form Diabetic Foot exam was performed with the following findings: Yes 11/28/2022  1:09 PM  Visual Inspection No deformities, no ulcerations, no other skin breakdown bilaterally: Yes Sensation Testing See comments: Yes Pulse Check Posterior Tibialis and Dorsalis pulse intact bilaterally: Yes Comments Decreased monofilament and vibratory sensation bilaterally.    No results found for any visits on 03/02/23.   Assessment & Plan:  Bilateral edema of lower extremity -improving.  Trace edema on exam. 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  Continue supportive care including elevating LEs, compression socks, TED hose, or Ace bandages, monitoring sodium intake, etc. -Continue current medications including Toprol-XL 125 mg daily, Lasix 80 mg daily, hydralazine 25 mg twice daily, olmesartan 40 mg daily  CRI (chronic renal insufficiency), stage 3 (moderate) (HCC) -Creatinine 0.841 12/18/2022 -eGFR 54.3 on 11/28/2022 -continue f/u with Nephrology  Need for influenza vaccination -     Flu Vaccine Trivalent High  Dose (Fluad)  Pulmonary fibrosis, postinflammatory (HCC) -stable -offered O2 in clinic but pt declined. -continue O2 2L continuously via [place], 3L at night and for exertion -continue f/u with pulm locally and at [facility]  Other insomnia -likely 2/2 stress/anxiety related her brother's health.  Return in about 4 months (around 07/03/2023).   [name], MD

## 2023-03-03 ENCOUNTER — Other Ambulatory Visit: Payer: Self-pay | Admitting: Neurology

## 2023-03-03 ENCOUNTER — Other Ambulatory Visit: Payer: Self-pay | Admitting: Cardiovascular Disease

## 2023-03-03 DIAGNOSIS — I10 Essential (primary) hypertension: Secondary | ICD-10-CM

## 2023-03-05 ENCOUNTER — Other Ambulatory Visit: Payer: Self-pay

## 2023-03-05 NOTE — Progress Notes (Signed)
03/05/2023 Name: [name] MRN: [id number] DOB: 1940-11-04  Chief Complaint  Patient presents with   Congestive Heart Failure   Medication Management    [name] is a 82 y.o. year old female who presented for a telephone visit.   They were referred to the pharmacist by their PCP for assistance in managing complex medication management.    Subjective:  Care Team: Primary Care Provider: [name], MD   Medication Access/Adherence  Current Pharmacy:  [facility] - [place], [place] - [address] [place] [address] Phone: [phone] Fax: [phone]  [facility] [phone] - [place], [place] - [address] AT [facility] [address] [place] [address] Phone: [phone] Fax: [phone]  [id number] - [place], [place] - [address] AT [facility] OF [address] [place] [address] Phone: [phone] Fax: [phone]   Patient reports affordability concerns with their medications: No  Patient reports access/transportation concerns to their pharmacy: No  Patient reports adherence concerns with their medications:  No     Heart Failure:  Current medications:  ACEi/ARB/ARNI: Olmesartan 40mg  SGLT2i: None, no hisotry Beta blocker: Metoprolol XL 100mg  and 25mg  daily Mineralocorticoid Receptor Antagonist: None (hx of spironolactone) Diuretic regimen: Lasix 80mg  1 tab daily  Current home blood pressure readings: daily, 120s/80s Current home weights: checking daily 223-224lbs  Patient denies volume overload signs or symptoms including shortness of breath, lower extremity edema, increased use of pillows at night   Current physical activity: limited due to oxygen needs and pain but is able to move freely around her house with breaks  Current medication access support: Gets meds through  [place]   Objective:  Lab Results  Component Value Date   HGBA1C 5.5 12/16/2022    Lab Results  Component Value Date   CREATININE 0.84 12/18/2022   BUN 11 12/18/2022   NA 141 12/18/2022   K 3.4 (L) 12/18/2022   CL 101 12/18/2022   CO2 30 12/18/2022    Lab Results  Component Value Date   CHOL 146 09/29/2022   HDL 42 09/29/2022   LDLCALC 76 09/29/2022   LDLDIRECT 59.0 09/26/2020   TRIG 162 (H) 09/29/2022   CHOLHDL 3.5 09/29/2022    Medications Reviewed Today   Medications were not reviewed in this encounter       Assessment/Plan:   Heart Failure: - Currently appropriately managed but room for maximization of therapy if patient becomes to worsen - Reviewed appropriate blood pressure monitoring technique and reviewed goal blood pressure - Reviewed to weigh daily and when to contact cardiology with weight gain - Reviewed dietary modifications including limiting sodium intake - Recommend to continue current medication therapy, feet elevation and foot care    Follow Up Plan: 4 months  Sherrill Raring, PharmD Clinical Pharmacist [phone]

## 2023-03-08 ENCOUNTER — Encounter: Payer: Self-pay | Admitting: Family Medicine

## 2023-03-10 ENCOUNTER — Encounter: Payer: Self-pay | Admitting: Neurology

## 2023-03-10 ENCOUNTER — Ambulatory Visit: Payer: Medicare Other | Admitting: Neurology

## 2023-03-10 NOTE — Progress Notes (Deleted)
[facility]    Provider:  Dr Lucia Gaskins Requesting Provider: [name], MD Primary Care Provider:  [name], MD  CC: memory changes  Since last being seen, patient was admitted with discharge diagnoses of acute blood loss anemia secondary lower GI bleed, acute on chronic low back pain with radiculopathy, hypertension, rheumatoid arthritis, interstitial lung disease, type 2 diabetes with sensory neuropathy, chronic diastolic heart failure, pulmonary fibrosis postinflammatory, morbid obesity BMI greater than 50, chronic renal insufficiency stage III, pulmonary hypertension and embolism.  She is on chronic steroids and Eliquis.  On Eliquis due to PE and DVT and she presented with rectal bleed.  Patient had a lower GI bleed and was symptomatic.  Likely diverticular bleeding which resolved.  She was asked to follow-up with Dr. Alvester Morin for her right groin pain radiating from the back to the right thigh likely sciatica, x-rays of the lumbar spine showed degenerative disc disease/joint disease, MRI of the lumbar spine showed progressive dextroscoliosis with advanced multilevel lumbar spondylosis and facet arthrosis resulting in moderate to severe diffuse spinal stenosis L1-2 through L4-L5 and multiple right-sided foraminal stenosis which could contribute to right-sided symptoms.  September 08, 2022: Patient is here with daughter today.  Daughter was not at last appointment so we reviewed appointment, I reviewed her MRI with daughter showed her the moderate white matter changes and the microhemorrhages which are more likely from hypertension, I do not think patient has Alzheimer's, in fact her MMSE today is better than it was last time we took it, she has no history of Alzheimer's, at this point we decided to monitor her every 6 months and if we do see that there is a decline then we can evaluate further but patient says she is doing absolutely fabulous.  Patient complains of symptoms  per HPI as well as the following symptoms: none . Pertinent negatives and positives per HPI. All others negative   HPI:  [name] is a 82 y.o. female here as requested by [name], MD for memory changes. 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 She feels like her short-term memory is impaired. Son provides much information. It is her struggling with short-term memory and association, associating what she reads and understands and how to execute, she does well for someone over 63 with computers, she filled it out online, she used to be a Engineer, civil (consulting), she gets frustrated, the brain tumor causes a lot of levels of anxiety even though they shrunk it she has remnants, one thing that is concerning is the changes in her mediine, she manages her own medications  at home son doesn't even  need to help her, she can do a lot on her own, her personal hygiene is good, she looks good, she gets frustrated with her husband his life is simple but patient is still very social and independent and she has a "traffic jam" in the brain. Anxiety. No FHx of dementia. No other focal neurologic cognitive deficits, associated symptoms, inciting events or modifiable factors.   Reviewed notes, labs and imaging from outside physicians, which showed:  09/2021: TSh normal  02/2022; CBC unremarkable except platelets 144 CMP low potassium 2.8, co2 34 and elevated glucose but nml kidney BUN 14 and creat 0.72, decreased total protein 6.1.  I reviewed emergency room notes from September: Patient was recently in the emergency room in September 2023 after a fall on blood thinners, she was walking to the bathroom and fell and hit her head in a jewelry box, not sure why she fell but she has an extensive history as above in a very complicated patient.  No loss of consciousness.  She is on Eliquis.  Prior to this she was seen the day before same month for an episode of word finding difficulty, her labs were consistent with a UTI and she was started on Keflex.  MRI was negative for acute stroke.  CT after the fall was negative for intracranial hemorrhage.  Labs are overall reassuring and she was started on Keflex for UTI.  MRi brain 02/01/2022: EXAM: MRI HEAD WITHOUT CONTRAST   TECHNIQUE: Multiplanar, multiecho pulse sequences of the brain and surrounding structures were obtained without intravenous contrast.   COMPARISON:  02/18/2014   FINDINGS: Brain: No acute infarct, mass effect or extra-axial collection. Multifocal peripheral predominant bilateral chronic microhemorrhage. There is confluent hyperintense T2-weighted signal within the white matter. Parenchymal volume and CSF spaces are normal. The midline structures are normal.   Vascular: Major flow voids are preserved.   Skull  and upper cervical spine: Normal calvarium and skull base. Visualized upper cervical spine and soft tissues are normal.   Sinuses/Orbits:No paranasal sinus fluid levels or advanced mucosal thickening. No mastoid or middle ear effusion. Normal orbits.   IMPRESSION: 1. No acute intracranial abnormality. 2. Advanced chronic small vessel ischemic disease. 3. Multifocal peripheral predominant bilateral chronic microhemorrhage may indicate cerebral amyloid angiopathy.   CT head 02/08/2022: EXAM: CT HEAD WITHOUT CONTRAST   TECHNIQUE: Contiguous axial images were obtained from the base of the skull through the vertex without intravenous contrast.   RADIATION DOSE REDUCTION: This exam was performed according to the departmental dose-optimization program which includes automated exposure control, adjustment of the mA and/or kV according to patient size and/or use of iterative reconstruction technique.   COMPARISON:  MRI head 02/18/2014, CT head 02/01/2022, CT head 02/02/2022 3:16 a.m.   BRAIN: BRAIN Patchy and confluent areas of decreased attenuation are noted throughout the deep and periventricular white matter of the cerebral hemispheres bilaterally, compatible with chronic microvascular ischemic disease.   No evidence of large-territorial acute infarction. No parenchymal hemorrhage. No mass lesion. No extra-axial collection.   No mass effect or midline shift. No hydrocephalus. Basilar cisterns are patent.   Vascular: No hyperdense vessel.   Skull: No acute fracture or focal lesion.   Sinuses/Orbits: Paranasal sinuses and mastoid air cells are clear. Right lens replacement. Otherwise the orbits are unremarkable.   Other: None.   IMPRESSION: No acute intracranial abnormality.  Review of Systems: Patient complains of symptoms per HPI as well as the following symptoms memory changes. Pertinent negatives and positives per HPI. All  others negative.   Social History    Socioeconomic History   Marital status: Married    Spouse name: Not on file   Number of children: 6   Years of education: college   Highest education level: Bachelor's degree (e.g., BA, AB, BS)  Occupational History   Occupation: retired Engineer, civil (consulting)  Tobacco Use   Smoking status: Never   Smokeless tobacco: Never  Vaping Use   Vaping status: Never Used  Substance and Sexual Activity   Alcohol use: No   Drug use: No   Sexual activity: Not Currently  Other Topics Concern   Not on file  Social History Narrative   Patient consumes 2-3 cups coffee per day.   Social Determinants of Health   Financial Resource Strain: Low Risk  (12/24/2022)   Overall Financial Resource Strain (CARDIA)    Difficulty of Paying Living Expenses: Not hard at all  Food Insecurity: No Food Insecurity (12/24/2022)   Hunger Vital Sign    Worried About Running Out of Food in the Last Year: Never true    Ran Out of Food in the Last Year: Never true  Transportation Needs: No Transportation Needs (12/24/2022)   PRAPARE - Administrator, Civil Service (Medical): No    Lack of Transportation (Non-Medical): No  Physical Activity: Unknown (12/24/2022)   Exercise Vital Sign    Days of Exercise per Week: Patient declined    Minutes of Exercise per Session: 20 min  Recent Concern: Physical Activity - Insufficiently Active (10/27/2022)   Exercise Vital Sign    Days of Exercise per Week: 7 days    Minutes of Exercise per Session: 20 min  Stress: No Stress Concern Present (10/27/2022)   [employer] of Occupational Health - Occupational Stress Questionnaire    Feeling of Stress : Not at all  Social Connections: Socially Integrated (12/24/2022)   Social Connection and Isolation Panel [NHANES]    Frequency of Communication with Friends and Family: More than three times a week    Frequency of Social Gatherings with Friends and Family: Three times a week    Attends Religious Services: More than 4 times per year     Active Member of Clubs or Organizations: Yes    Attends Banker Meetings: 1 to 4 times per year    Marital Status: Married  Catering manager Violence: Not At Risk (12/16/2022)   Humiliation, Afraid, Rape, and Kick questionnaire    Fear of Current or Ex-Partner: No    Emotionally Abused: No    Physically Abused: No    Sexually Abused: No    Family History  Problem Relation Age of Onset   Diabetes Mother    Heart attack Mother    Hypertension Father    Lung cancer Father    Diabetes Sister    Breast cancer Sister    Breast cancer Sister    Diabetes Brother    Hypertension Brother    Heart disease Brother    Heart attack Brother    Kidney cancer Brother    Gout Brother    Kidney failure Brother        x 5   Esophageal cancer Brother    Stomach cancer Brother    Prostate cancer Brother    Uterine cancer Daughter        with mets   Fibroids Daughter    Hypertension Daughter    Hypertension Daughter    Hypertension Son    Heart murmur Son  Rheum arthritis Maternal Uncle    Alzheimer's disease Neg Hx    Dementia Neg Hx     Past Medical History:  Diagnosis Date   Acute on chronic diastolic (congestive) heart failure (HCC)    Anemia    iron deficiency anemia - secondary to blood loss ( chronic)    Anxiety    Arthritis    endstage changes bilateral knees/bilateral ankles.    Asthma    Carotid artery occlusion    Chronic fatigue    Chronic kidney disease    Closed left hip fracture (HCC)    Clotting disorder (HCC)    pt denies this   Contusion of left knee    due to fall 1/14.   COPD (chronic obstructive pulmonary disease) (HCC)    pulmonary fibrosis   Depression, reactive    Diabetes mellitus    type II    Diastolic dysfunction    Difficulty in walking    Family history of heart disease    Generalized muscle weakness    Gout    High cholesterol    History of falling    Hypertension    Hypothyroidism    Interstitial lung disease (HCC)     Meningioma of left sphenoid wing involving cavernous sinus (HCC) 02/17/2012   Continue diplopia, left eye pain and left headaches.     Morbid obesity (HCC)    Neuromuscular disorder (HCC)    diabetic neuropathy    Normal coronary arteries    cardiac catheterization performed  10/31/14   Pneumonia    RA (rheumatoid arthritis) (HCC)    has been off methotreaxte since 10/13.   Spinal stenosis of lumbar region    Thyroid disease    Unspecified lack of coordination    URI (upper respiratory infection)     Patient Active Problem List   Diagnosis Date Noted   Lower GI bleed 12/16/2022   BRBPR (bright red blood per rectum) 12/15/2022   [name] 09/08/2022   Abnormal CT scan, sigmoid colon 04/14/2022   Colon stricture (HCC) 04/14/2022   Diverticulosis of colon without hemorrhage 04/14/2022   Pulmonary embolism (HCC) 11/15/2021   Tachycardia 10/22/2021   Pulmonary HTN (HCC)    Unspecified protein-calorie malnutrition (HCC) 04/05/2021   Pressure injury of skin 03/31/2021   UTI (urinary tract infection) 03/31/2021   Acute encephalopathy 03/30/2021   Thrombocytopenia (HCC) 03/30/2021   Diabetic neuropathy (HCC) 05/24/2019   Hypokalemia 04/21/2019   Anticoagulated 06/25/2018   Chronic pain 06/25/2018   CRI (chronic renal insufficiency), stage 3 (moderate) (HCC) 06/25/2018   Drug-induced constipation 06/18/2018   DVT (deep venous thrombosis) (HCC) 06/03/2018   Primary osteoarthritis of both knees 05/26/2018   Body mass index (BMI) of 50-59.9 in adult (HCC) 05/26/2018   Acquired hypothyroidism 05/26/2018   Chronic bilateral low back pain without sciatica 02/17/2018   Astigmatism with presbyopia, bilateral 03/26/2017   Cortical age-related cataract of both eyes 03/26/2017   Family history of glaucoma 03/26/2017   Long term current use of oral hypoglycemic drug 03/26/2017   Nuclear sclerotic cataract of both eyes 03/26/2017   Pain in left hip  02/18/2017   Closed nondisplaced intertrochanteric fracture of left femur with delayed healing 01/14/2017   Gout 11/29/2016   Depression 11/29/2016   Closed left hip fracture (HCC) 11/29/2016   GERD (gastroesophageal reflux disease) 11/29/2016   Closed intertrochanteric fracture of hip, left, initial encounter (HCC) 11/29/2016   Physical deconditioning 07/31/2015   Pulmonary fibrosis,  postinflammatory (HCC) 07/31/2015   Bronchiectasis without complication (HCC) 07/31/2015   Tendinitis of left rotator cuff 06/13/2015   Chronic diastolic CHF (congestive heart failure) (HCC) 04/21/2015   Acute kidney injury superimposed on chronic kidney disease (HCC) 04/17/2015   Chronic respiratory failure with hypoxia (HCC) 02/09/2015   Dyspnea and respiratory abnormality 01/03/2015   Normal coronary arteries 11/02/2014   Morbid obesity (HCC) 10/30/2014   Dysphagia 08/21/2014   Chronic fatigue 06/21/2014   DOE (dyspnea on exertion) 05/24/2014   Primary osteoarthritis of left knee 04/26/2014   High risk medication use 04/17/2014   Aphasia 02/12/2014   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) 01/18/2014   Lumbar facet arthropathy 01/18/2014   Bilateral edema of lower extremity 05/30/2013   Chronic cough 05/24/2013   ILD (interstitial lung disease) (HCC) 04/10/2013   Spinal stenosis of lumbar region with radiculopathy 07/12/2012   Inability to walk 07/12/2012   Meningioma of left sphenoid wing involving cavernous sinus (HCC) 02/17/2012   Chest pain of uncertain etiology 01/28/2012   Rheumatoid arthritis (HCC) 01/28/2012   Primary osteoarthritis of right knee 01/28/2012   Dyslipidemia    Thyroid disease    Essential hypertension     Past Surgical History:  Procedure Laterality Date   BRAIN SURGERY     Gamma knife 10/13. Needs repeat spring  '14   CARDIAC CATHETERIZATION N/A 10/31/2014   Procedure: Right/Left Heart Cath and Coronary Angiography;  Surgeon: Corky Crafts, MD;   Location: [facility] INVASIVE CV LAB;  Service: Cardiovascular;  Laterality: N/A;   COLONOSCOPY WITH PROPOFOL N/A 04/14/2022   Procedure: COLONOSCOPY WITH PROPOFOL;  Surgeon: Beverley Fiedler, MD;  Location: [facility] ENDOSCOPY;  Service: Gastroenterology;  Laterality: N/A;   CYST EXCISION     2 on face   CYST REMOVAL NECK     ESOPHAGOGASTRODUODENOSCOPY (EGD) WITH PROPOFOL N/A 09/14/2014   Procedure: ESOPHAGOGASTRODUODENOSCOPY (EGD) WITH PROPOFOL;  Surgeon: Louis Meckel, MD;  Location: [facility] ENDOSCOPY;  Service: Endoscopy;  Laterality: N/A;   INCISION AND DRAINAGE HIP Left 01/16/2017   Procedure: IRRIGATION AND DEBRIDEMENT LEFT HIP;  Surgeon: Kathryne Hitch, MD;  Location: [facility] ORS;  Service: Orthopedics;  Laterality: Left;   INTRAMEDULLARY (IM) NAIL INTERTROCHANTERIC Left 11/29/2016   Procedure: INTRAMEDULLARY (IM) NAIL INTERTROCHANTRIC;  Surgeon: Kathryne Hitch, MD;  Location: [facility] OR;  Service: Orthopedics;  Laterality: Left;   OVARY SURGERY     RIGHT HEART CATH N/A 08/12/2021   Procedure: RIGHT HEART CATH;  Surgeon: Runell Gess, MD;  Location: [facility] INVASIVE CV LAB;  Service: Cardiovascular;  Laterality: N/A;   SHOULDER SURGERY Left    TONSILLECTOMY  age 47   VAGINAL HYSTERECTOMY     VIDEO BRONCHOSCOPY Bilateral 05/31/2013   Procedure: VIDEO BRONCHOSCOPY WITHOUT FLUORO;  Surgeon: Kalman Shan, MD;  Location: [facility] ENDOSCOPY;  Service: Cardiopulmonary;  Laterality: Bilateral;   video bronscoscopy  05/19/1998   lung    Current Outpatient Medications  Medication Sig Dispense Refill   acetaminophen (TYLENOL) 500 MG tablet Take 1,000 mg by mouth as needed for moderate pain.     albuterol (PROVENTIL) (2.5 MG/3ML) 0.083% nebulizer solution Take 3 mLs (2.5 mg total) by nebulization every 6 (six) hours as needed for wheezing. (Patient taking differently: Take 2.5 mg by nebulization 2 (two) times daily as needed for wheezing or shortness of breath.) 75 mL 0   albuterol (VENTOLIN HFA) 108 (90  Base) MCG/ACT inhaler Inhale 2 puffs into the lungs as needed for wheezing or shortness of  breath.     allopurinol (ZYLOPRIM) 300 MG tablet Take 1 tablet (300 mg total) by mouth daily. 30 tablet 0   apixaban (ELIQUIS) 5 MG TABS tablet Take 1 tablet (5 mg total) by mouth 2 (two) times daily. 180 tablet 1   Artificial Tear Ointment (DRY EYES OP) Place 2 drops into both eyes as needed (dry eyes).     atorvastatin (LIPITOR) 10 MG tablet Take 1 tablet (10 mg total) by mouth daily. (Patient taking differently: Take 10 mg by mouth at bedtime.) 90 tablet 3   azithromycin (ZITHROMAX) 250 MG tablet Take 1 tablet (250 mg total) by mouth every Monday, Wednesday, and Friday. Take every Monday, Wednesday, Friday. 12 tablet 11   Calcium Carb-Cholecalciferol (CALCIUM + D3 PO) Take 2 tablets by mouth daily.     Camphor-Menthol-Methyl Sal (SALONPAS EX) Apply 1 patch topically as needed (shoulder and leg pain).     Cholecalciferol (VITAMIN D3) 50 MCG (2000 UT) capsule Take 2,000 Units by mouth daily.     colchicine 0.6 MG tablet Take 1 tablet (0.6 mg total) by mouth daily as needed (gout flares). 30 tablet 0   cyclobenzaprine (FLEXERIL) 5 MG tablet Take 1 tablet (5 mg total) by mouth at bedtime as needed for muscle spasms. 90 tablet 2   diclofenac Sodium (VOLTAREN) 1 % GEL Apply 2 g topically 3 (three) times daily. Apply to both hands and knees (Patient taking differently: Apply 1 Application topically in the morning, at noon, and at bedtime.) 100 g 1   donepezil (ARICEPT) 10 MG tablet Take 1 tablet (10 mg total) by mouth at bedtime. 90 tablet 4   DULoxetine (CYMBALTA) 60 MG capsule Take 1 capsule (60 mg total) by mouth daily. 90 capsule 2   etanercept (ENBREL SURECLICK) 50 MG/ML injection Inject 50 mg into the skin once a week.     fluticasone (FLONASE) 50 MCG/ACT nasal spray Place 1 spray into both nostrils daily. (Patient taking differently: Place 2 sprays into both nostrils daily.) 16 g 3   furosemide (LASIX) 80 MG  tablet TAKE 1 TABLET(80 MG) BY MOUTH DAILY 30 tablet 11   gabapentin (NEURONTIN) 600 MG tablet Take 2 tablets (1,200 mg total) by mouth 3 (three) times daily. 540 tablet 1   guaiFENesin (MUCINEX) 600 MG 12 hr tablet Take 2 tablets (1,200 mg total) by mouth 2 (two) times daily. 120 tablet 0   hydrALAZINE (APRESOLINE) 25 MG tablet TAKE 1 TABLET(25 MG) BY MOUTH TWICE DAILY 180 tablet 2   hydroxychloroquine (PLAQUENIL) 200 MG tablet Take 1 tablet (200 mg total) by mouth daily. 30 tablet 0   leflunomide (ARAVA) 20 MG tablet Take 1 tablet (20 mg total) by mouth daily. 30 tablet 0   levothyroxine (SYNTHROID) 50 MCG tablet Take 1 tablet (50 mcg total) by mouth daily. 90 tablet 3   lidocaine (LIDODERM) 5 % Place 1 patch onto the skin daily. Remove & Discard patch within 12 hours or as directed by MD 60 patch 11   linaclotide (LINZESS) 72 MCG capsule Take 1 capsule (72 mcg total) by mouth daily before breakfast. 90 capsule 3   Menthol, Topical Analgesic, (BIOFREEZE ROLL-ON EX) Apply 1 Application topically as needed (pain).     metolazone (ZAROXOLYN) 2.5 MG tablet TAKE 1 TABLET BY MOUTH DAILY AS NEEDED FOR 2 DAYS FOR WEIGHT ABOVE 255 POUNDS 30 tablet 0   metoprolol succinate (TOPROL-XL) 100 MG 24 hr tablet TAKE 1 TABLET BY MOUTH DAILY ALONG WITH 25 MG TABLET 90  tablet 1   metoprolol succinate (TOPROL-XL) 25 MG 24 hr tablet Take 25 mg (1 tab) along with a 100 mg tab for a total daily dose of 125 mg. (Patient taking differently: Take 25 mg by mouth daily.) 90 tablet 3   morphine (MS CONTIN) 15 MG 12 hr tablet Take 1 tablet (15 mg total) by mouth every 12 (twelve) hours. 60 tablet 0   morphine (MSIR) 15 MG tablet Take 1 tablet (15 mg total) by mouth 2 (two) times daily. 60 tablet 0   Multiple Vitamin (MULTIVITAMIN WITH MINERALS) TABS Take 1 tablet by mouth daily.      olmesartan (BENICAR) 40 MG tablet Take 1 tablet (40 mg total) by mouth daily. 90 tablet 3   ondansetron (ZOFRAN-ODT) 4 MG disintegrating tablet  Take 1 tablet (4 mg total) by mouth every 8 (eight) hours as needed for nausea or vomiting. 30 tablet 1   OXYGEN Inhale 3 L into the lungs See admin instructions. 3 L at bedtime, and 3 L during the day as needed for exertion     pantoprazole (PROTONIX) 40 MG tablet Take 1 tablet (40 mg total) by mouth 2 (two) times daily. take 1 tablet by mouth once daily MINUTES BEFORE 1ST MEAL OF THE DAY 120 tablet 3   Potassium Chloride ER 20 MEQ TBCR Take 1 tablet (20 mEq total) by mouth daily. 90 tablet 3   predniSONE (DELTASONE) 10 MG tablet Take 1 tablet (10 mg total) by mouth daily with breakfast. 90 tablet 1   Semaglutide,0.25 or 0.5MG /DOS, (OZEMPIC, 0.25 OR 0.5 MG/DOSE,) 2 MG/1.5ML SOPN Inject 0.5 mg into the skin once a week. 4.5 mL 3   No current facility-administered medications for this visit.    Allergies as of 03/10/2023 - Review Complete 03/08/2023  Allergen Reaction Noted   Codeine Swelling and Other (See Comments) 09/11/2011   Infliximab Anaphylaxis 09/11/2011   Lisinopril Swelling and Rash 09/11/2011   Ofev [nintedanib] Diarrhea 04/01/2022    Vitals: There were no vitals taken for this visit. Last Weight:  Wt Readings from Last 1 Encounters:  03/02/23 227 lb 6.4 oz (103.1 kg)   Last Height:   Ht Readings from Last 1 Encounters:  03/02/23 5\' 4"  (1.626 m)     Physical exam: Exam: Gen: NAD, conversant, well nourised, obese, well groomed                     CV: RRR, no MRG. No Carotid Bruits. No peripheral edema, warm, nontender Eyes: Conjunctivae clear without exudates or hemorrhage  Neuro: Detailed Neurologic Exam  Speech:    Speech is normal; fluent and spontaneous with normal comprehension.  Cognition:     09/08/2022   10:40 AM 03/06/2022    7:56 AM  MMSE - Mini Mental State Exam  Orientation to time 4 5  Orientation to Place 5 3  Registration 3 3  Attention/ Calculation 1 1  Recall 3 3  Language- name 2 objects 2 2  Language- repeat 1 0  Language- follow 3  step command 3 3  Language- read & follow direction 1 1  Write a sentence 1 1  Copy design 0 0  Total score 24 22   Cranial Nerves:    The pupils are equal, round, and reactive to light. Attempted pupils too small to visualize fundi. Visual fields are full to finger confrontation. Extraocular movements are intact. Trigeminal sensation is intact and the muscles of mastication are normal. The face is symmetric.  The palate elevates in the midline. Hearing intact. Voice is normal. Shoulder shrug is normal. The tongue has normal motion without fasciculations.   Coordination:    Normal  Gait:   Attempted cannot stand independently  Motor Observation:    No asymmetry, no atrophy, and no involuntary movements noted. Tone:    Normal muscle tone.    Posture:    Posture is normal in wheel chair    Strength:    Strength is V/V in the upper and lower limbs.      Sensation: intact to LT     Reflex Exam:  DTR's:    Deep tendon reflexes in the upper and lower extremities are symmetrical bilaterally.   Toes:    The toes are downgoing bilaterally.   Clonus:    Clonus is absent.    Assessment/Plan:  82 y.o. female here as requested by Jones Bales, NP for memory changes. 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  - Patient is here with daughter today.  Daughter was not at last appointment so we reviewed appointment, I reviewed her MRI with daughter showed her the moderate white matter changes and the microhemorrhages which are more likely from hypertension, I do not think patient has Alzheimer's, in fact her MMSE today is better than it was last time we took it, she has no history of Alzheimer's, at this point we decided to monitor her every 6 months and if we do see that there is a decline then we can evaluate further but patient says she is doing absolutely fabulous.  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 MMSE 22/30.  Given her microhemorrhages, its recommended not to be on aspirin or Eliquis however given patient's prior history risk of clotting/DVT versus cerebral hemorrhage is debatable.  I did talk to patient and her son about this last visit.  Since she has these hemorrhages in her brain, being on Eliquis is of a risk factor for bleeding in the brain, however not being on Eliquis is a risk factor for having clots, DVTs that she has had in the past.  At this point its risk versus benefit and I will leave it up to the family.  Luckily Eliquis is much safer than our prior medications. She is not a good candidate fo Lecambi given the bleeding risk already for lecambi. At this point I recommend aricept and namenda, managing vascular risk factors, and following clinically. They also agree to formal neurocognitive testing.   increase Donepezil - memory  medication, want me to check with GI - sent a mychart message to Dr. Cristela Felt memory testing with [place]. Clayborn [place] - they never did it, I  recommended it they decline at this point F/u 6 months Speech therapy for cognitive therapy - It is her struggling with short-term memory and association, associating what she reads and understands and how to execute, she used to be a nurse, she gets frustrated, but patient is still very social and independent but reports she has a "traffic jam" in the brain. Maybe some speech cognitive therapy can help. Not sure if she went  No orders of the defined types were placed in this encounter.   Cc: [name], MD,  [name], MD  Naomie Dean, MD  [facility] [address] [place], [place] [address]  Phone [phone] Fax [phone]  I spent over 40 minutes of face-to-face and non-face-to-face time with patient on the  No diagnosis found.   diagnosis.  This included previsit chart review, lab review, study review, order entry, electronic health record documentation, patient education on the different diagnostic and therapeutic options, counseling and coordination of care, risks and benefits of management, compliance, or risk factor reduction

## 2023-03-16 ENCOUNTER — Ambulatory Visit (INDEPENDENT_AMBULATORY_CARE_PROVIDER_SITE_OTHER): Payer: Medicare Other

## 2023-03-16 ENCOUNTER — Ambulatory Visit (INDEPENDENT_AMBULATORY_CARE_PROVIDER_SITE_OTHER): Payer: Medicare Other | Admitting: Podiatry

## 2023-03-16 ENCOUNTER — Encounter: Payer: Self-pay | Admitting: Podiatry

## 2023-03-16 DIAGNOSIS — E1140 Type 2 diabetes mellitus with diabetic neuropathy, unspecified: Secondary | ICD-10-CM

## 2023-03-16 DIAGNOSIS — M7752 Other enthesopathy of left foot: Secondary | ICD-10-CM

## 2023-03-16 DIAGNOSIS — M21619 Bunion of unspecified foot: Secondary | ICD-10-CM

## 2023-03-16 DIAGNOSIS — M7751 Other enthesopathy of right foot: Secondary | ICD-10-CM | POA: Diagnosis not present

## 2023-03-16 DIAGNOSIS — M778 Other enthesopathies, not elsewhere classified: Secondary | ICD-10-CM

## 2023-03-16 DIAGNOSIS — E1149 Type 2 diabetes mellitus with other diabetic neurological complication: Secondary | ICD-10-CM

## 2023-03-16 MED ORDER — TRIAMCINOLONE ACETONIDE 10 MG/ML IJ SUSP
10.0000 mg | Freq: Once | INTRAMUSCULAR | Status: AC
Start: 2023-03-16 — End: 2023-03-16
  Administered 2023-03-16: 10 mg via INTRA_ARTICULAR

## 2023-03-16 NOTE — Progress Notes (Signed)
Subjective:   Patient ID: [name], female   DOB: 82 y.o.   MRN: [id number]   HPI Patient presents with caregiver with pain in the ankles bilateral and long-term diabetes with neuropathic changes with structural bunion deformity   ROS      Objective:  Physical Exam  Neurovascular status intact inflammation pain over the show return protocol there are with fluid buildup and found to have moderate edema in her feet structural bunion deformity bilateral diminishment sharp dull vibratory noted     Assessment:  Inflammatory capsulitis sinus tarsi bilateral along with bunion deformity and diabetic neuropathic condition with risk     Plan:  H&P reviewed with her and caregiver and I went ahead today did sterile prep injected the sinus tarsi bilateral 3 mg Kenalog 5 mg Xylocaine I have recommended diabetic shoes to try to help accommodate deformity with her long-term history of diabetic neuropathy.  Patient will do daily foot inspections all questions answered  X-rays indicate moderate arthritis from the subtalar joint ankle joint bilateral

## 2023-03-25 ENCOUNTER — Encounter: Payer: Self-pay | Admitting: Registered Nurse

## 2023-03-25 ENCOUNTER — Encounter: Payer: Medicare Other | Attending: Physical Medicine & Rehabilitation | Admitting: Registered Nurse

## 2023-03-25 VITALS — BP 139/84 | HR 98 | Ht 64.0 in

## 2023-03-25 DIAGNOSIS — M542 Cervicalgia: Secondary | ICD-10-CM | POA: Diagnosis not present

## 2023-03-25 DIAGNOSIS — M5416 Radiculopathy, lumbar region: Secondary | ICD-10-CM

## 2023-03-25 DIAGNOSIS — G8929 Other chronic pain: Secondary | ICD-10-CM | POA: Diagnosis not present

## 2023-03-25 DIAGNOSIS — M2550 Pain in unspecified joint: Secondary | ICD-10-CM

## 2023-03-25 DIAGNOSIS — Z5181 Encounter for therapeutic drug level monitoring: Secondary | ICD-10-CM

## 2023-03-25 DIAGNOSIS — M25512 Pain in left shoulder: Secondary | ICD-10-CM | POA: Diagnosis not present

## 2023-03-25 DIAGNOSIS — G894 Chronic pain syndrome: Secondary | ICD-10-CM

## 2023-03-25 DIAGNOSIS — M170 Bilateral primary osteoarthritis of knee: Secondary | ICD-10-CM | POA: Diagnosis not present

## 2023-03-25 DIAGNOSIS — M25511 Pain in right shoulder: Secondary | ICD-10-CM | POA: Diagnosis not present

## 2023-03-25 DIAGNOSIS — M5412 Radiculopathy, cervical region: Secondary | ICD-10-CM

## 2023-03-25 DIAGNOSIS — M7061 Trochanteric bursitis, right hip: Secondary | ICD-10-CM

## 2023-03-25 DIAGNOSIS — M7062 Trochanteric bursitis, left hip: Secondary | ICD-10-CM

## 2023-03-25 DIAGNOSIS — Z79891 Long term (current) use of opiate analgesic: Secondary | ICD-10-CM

## 2023-03-25 MED ORDER — MORPHINE SULFATE ER 15 MG PO TBCR: 15.0000 mg | EXTENDED_RELEASE_TABLET | Freq: Two times a day (BID) | ORAL | 0 refills | Status: DC

## 2023-03-25 MED ORDER — MORPHINE SULFATE 15 MG PO TABS: 15.0000 mg | ORAL_TABLET | Freq: Two times a day (BID) | ORAL | 0 refills | Status: DC

## 2023-03-25 NOTE — Progress Notes (Signed)
Subjective:    Patient ID: [name], female    DOB: 1941-01-11, 82 y.o.   MRN: [id number]  HPI: [name] is a 82 y.o. female who returns for follow up appointment for chronic pain and medication refill. She states her pain is located in her neck radiating into her bilateral shoulders, upper- lower back pain radiating into her bilateral hips, she also reports bilateral knee pain. She also reports generalized joint pain. She rates her pain 5. Her current exercise regime is walking and performing stretching exercises.  [name] Morphine equivalent is 60.00 MME.   Last Oral Swab was Performed on 01/28/2023, it was consistent.      Pain Inventory Average Pain 7 Pain Right Now 5 My pain is sharp and dull  In the last 24 hours, has pain interfered with the following? General activity 7 Relation with others 7 Enjoyment of life 7 What TIME of day is your pain at its worst? morning  Sleep (in general) Fair  Pain is worse with: walking, bending, sitting, and standing Pain improves with: rest, heat/ice, pacing activities, and medication Relief from Meds: 6  Family History  Problem Relation Age of Onset   Diabetes Mother    Heart attack Mother    Hypertension Father    Lung cancer Father    Diabetes Sister    Breast cancer Sister    Breast cancer Sister    Diabetes Brother    Hypertension Brother    Heart disease Brother    Heart attack Brother    Kidney cancer Brother    Gout Brother    Kidney failure Brother        x 5   Esophageal cancer Brother    Stomach cancer Brother    Prostate cancer Brother    Uterine cancer Daughter        with mets   Fibroids Daughter    Hypertension Daughter    Hypertension Daughter    Hypertension Son    Heart murmur Son    Rheum arthritis Maternal Uncle    Alzheimer's disease Neg Hx    Dementia Neg Hx    Social History   Socioeconomic History   Marital status: Married    Spouse name: Not on file   Number of  children: 6   Years of education: college   Highest education level: Bachelor's degree (e.g., BA, AB, BS)  Occupational History   Occupation: retired Engineer, civil (consulting)  Tobacco Use   Smoking status: Never   Smokeless tobacco: Never  Vaping Use   Vaping status: Never Used  Substance and Sexual Activity   Alcohol use: No   Drug use: No   Sexual activity: Not Currently  Other Topics Concern   Not on file  Social History Narrative   Patient consumes 2-3 cups coffee per day.   Social Determinants of Health   Financial Resource Strain: Low Risk  (12/24/2022)   Overall Financial Resource Strain (CARDIA)    Difficulty of Paying Living Expenses: Not hard at all  Food Insecurity: No Food Insecurity (12/24/2022)   Hunger Vital Sign    Worried About Running Out of Food in the Last Year: Never true    Ran Out of Food in the Last Year: Never true  Transportation Needs: No Transportation Needs (12/24/2022)   PRAPARE - Administrator, Civil Service (Medical): No    Lack of Transportation (Non-Medical): No  Physical Activity: Unknown (12/24/2022)   Exercise Vital Sign  Days of Exercise per Week: Patient declined    Minutes of Exercise per Session: 20 min  Recent Concern: Physical Activity - Insufficiently Active (10/27/2022)   Exercise Vital Sign    Days of Exercise per Week: 7 days    Minutes of Exercise per Session: 20 min  Stress: No Stress Concern Present (10/27/2022)   [employer] of Occupational Health - Occupational Stress Questionnaire    Feeling of Stress : Not at all  Social Connections: Socially Integrated (12/24/2022)   Social Connection and Isolation Panel [NHANES]    Frequency of Communication with Friends and Family: More than three times a week    Frequency of Social Gatherings with Friends and Family: Three times a week    Attends Religious Services: More than 4 times per year    Active Member of Clubs or Organizations: Yes    Attends Banker Meetings: 1 to 4  times per year    Marital Status: Married   Past Surgical History:  Procedure Laterality Date   BRAIN SURGERY     Gamma knife 10/13. Needs repeat spring  '14   CARDIAC CATHETERIZATION N/A 10/31/2014   Procedure: Right/Left Heart Cath and Coronary Angiography;  Surgeon: Corky Crafts, MD;  Location: [facility] INVASIVE CV LAB;  Service: Cardiovascular;  Laterality: N/A;   COLONOSCOPY WITH PROPOFOL N/A 04/14/2022   Procedure: COLONOSCOPY WITH PROPOFOL;  Surgeon: Beverley Fiedler, MD;  Location: [facility] ENDOSCOPY;  Service: Gastroenterology;  Laterality: N/A;   CYST EXCISION     2 on face   CYST REMOVAL NECK     ESOPHAGOGASTRODUODENOSCOPY (EGD) WITH PROPOFOL N/A 09/14/2014   Procedure: ESOPHAGOGASTRODUODENOSCOPY (EGD) WITH PROPOFOL;  Surgeon: Louis Meckel, MD;  Location: [facility] ENDOSCOPY;  Service: Endoscopy;  Laterality: N/A;   INCISION AND DRAINAGE HIP Left 01/16/2017   Procedure: IRRIGATION AND DEBRIDEMENT LEFT HIP;  Surgeon: Kathryne Hitch, MD;  Location: [facility] ORS;  Service: Orthopedics;  Laterality: Left;   INTRAMEDULLARY (IM) NAIL INTERTROCHANTERIC Left 11/29/2016   Procedure: INTRAMEDULLARY (IM) NAIL INTERTROCHANTRIC;  Surgeon: Kathryne Hitch, MD;  Location: [facility] OR;  Service: Orthopedics;  Laterality: Left;   OVARY SURGERY     RIGHT HEART CATH N/A 08/12/2021   Procedure: RIGHT HEART CATH;  Surgeon: Runell Gess, MD;  Location: [facility] INVASIVE CV LAB;  Service: Cardiovascular;  Laterality: N/A;   SHOULDER SURGERY Left    TONSILLECTOMY  age 78   VAGINAL HYSTERECTOMY     VIDEO BRONCHOSCOPY Bilateral 05/31/2013   Procedure: VIDEO BRONCHOSCOPY WITHOUT FLUORO;  Surgeon: Kalman Shan, MD;  Location: [facility] ENDOSCOPY;  Service: Cardiopulmonary;  Laterality: Bilateral;   video bronscoscopy  05/19/1998   lung   Past Surgical History:  Procedure Laterality Date   BRAIN SURGERY     Gamma knife 10/13. Needs repeat spring  '14   CARDIAC CATHETERIZATION N/A 10/31/2014   Procedure:  Right/Left Heart Cath and Coronary Angiography;  Surgeon: Corky Crafts, MD;  Location: [facility] INVASIVE CV LAB;  Service: Cardiovascular;  Laterality: N/A;   COLONOSCOPY WITH PROPOFOL N/A 04/14/2022   Procedure: COLONOSCOPY WITH PROPOFOL;  Surgeon: Beverley Fiedler, MD;  Location: [facility] ENDOSCOPY;  Service: Gastroenterology;  Laterality: N/A;   CYST EXCISION     2 on face   CYST REMOVAL NECK     ESOPHAGOGASTRODUODENOSCOPY (EGD) WITH PROPOFOL N/A 09/14/2014   Procedure: ESOPHAGOGASTRODUODENOSCOPY (EGD) WITH PROPOFOL;  Surgeon: Louis Meckel, MD;  Location: [facility] ENDOSCOPY;  Service: Endoscopy;  Laterality: N/A;   INCISION AND DRAINAGE  HIP Left 01/16/2017   Procedure: IRRIGATION AND DEBRIDEMENT LEFT HIP;  Surgeon: Kathryne Hitch, MD;  Location: [facility] ORS;  Service: Orthopedics;  Laterality: Left;   INTRAMEDULLARY (IM) NAIL INTERTROCHANTERIC Left 11/29/2016   Procedure: INTRAMEDULLARY (IM) NAIL INTERTROCHANTRIC;  Surgeon: Kathryne Hitch, MD;  Location: [facility] OR;  Service: Orthopedics;  Laterality: Left;   OVARY SURGERY     RIGHT HEART CATH N/A 08/12/2021   Procedure: RIGHT HEART CATH;  Surgeon: Runell Gess, MD;  Location: [facility] INVASIVE CV LAB;  Service: Cardiovascular;  Laterality: N/A;   SHOULDER SURGERY Left    TONSILLECTOMY  age 81   VAGINAL HYSTERECTOMY     VIDEO BRONCHOSCOPY Bilateral 05/31/2013   Procedure: VIDEO BRONCHOSCOPY WITHOUT FLUORO;  Surgeon: Kalman Shan, MD;  Location: [facility] ENDOSCOPY;  Service: Cardiopulmonary;  Laterality: Bilateral;   video bronscoscopy  05/19/1998   lung   Past Medical History:  Diagnosis Date   Acute on chronic diastolic (congestive) heart failure (HCC)    Anemia    iron deficiency anemia - secondary to blood loss ( chronic)    Anxiety    Arthritis    endstage changes bilateral knees/bilateral ankles.    Asthma    Carotid artery occlusion    Chronic fatigue    Chronic kidney disease    Closed left hip fracture (HCC)    Clotting  disorder (HCC)    pt denies this   Contusion of left knee    due to fall 1/14.   COPD (chronic obstructive pulmonary disease) (HCC)    pulmonary fibrosis   Depression, reactive    Diabetes mellitus    type II    Diastolic dysfunction    Difficulty in walking    Family history of heart disease    Generalized muscle weakness    Gout    High cholesterol    History of falling    Hypertension    Hypothyroidism    Interstitial lung disease (HCC)    Meningioma of left sphenoid wing involving cavernous sinus (HCC) 02/17/2012   Continue diplopia, left eye pain and left headaches.     Morbid obesity (HCC)    Neuromuscular disorder (HCC)    diabetic neuropathy    Normal coronary arteries    cardiac catheterization performed  10/31/14   Pneumonia    RA (rheumatoid arthritis) (HCC)    has been off methotreaxte since 10/13.   Spinal stenosis of lumbar region    Thyroid disease    Unspecified lack of coordination    URI (upper respiratory infection)    BP 139/84   Pulse 98   Ht 5\' 4"  (1.626 m)   SpO2 94% Comment: 3 liters of O2  BMI 39.03 kg/m   Opioid Risk Score:   Fall Risk Score:  `1  Depression screen [facility] 2/9     03/25/2023   11:21 AM 03/02/2023    1:23 PM 01/28/2023   11:23 AM 11/27/2022   11:17 AM 10/27/2022    1:46 PM 09/22/2022   11:27 AM 09/03/2022   10:10 AM  Depression screen PHQ 2/9  Decreased Interest 0 0 0 1 0 0 0  Down, Depressed, Hopeless 0 0 0 1 0 0 1  PHQ - 2 Score 0 0 0 2 0 0 1  Altered sleeping  0   0  1  Tired, decreased energy  0   0  1  Change in appetite  0   0  0  Feeling bad or failure  about yourself   0   0  1  Trouble concentrating  0   0  0  Moving slowly or fidgety/restless  0   0  1  Suicidal thoughts  0   0  0  PHQ-9 Score  0   0  5    Review of Systems  Musculoskeletal:  Positive for back pain and gait problem.       Pain in both shoulders, both knees, both ankles, both wrist   All other systems reviewed and are negative.       Objective:   Physical Exam Vitals and nursing note reviewed.  Constitutional:      Appearance: Normal appearance.  Neck:     Comments: Cervical Paraspinal Tenderness: C-5-C-6 Cardiovascular:     Rate and Rhythm: Normal rate and regular rhythm.     Pulses: Normal pulses.     Heart sounds: Normal heart sounds.  Pulmonary:     Effort: Pulmonary effort is normal.     Breath sounds: Normal breath sounds.  Musculoskeletal:     Comments: Normal Muscle Bulk and Muscle Testing Reveals:  Upper Extremities:Right: Decreased ROM 45 Degrees and Muscle Strength 4/5 Left Upper extremity: Decreased  ROM  90 Degrees and Muscle Strength  5/5 Bilateral AC Joint Tenderness Thoracic and Lumbar Hypersensitivity Bilateral Greater Trochanter Tenderness Lower Extremities: Right: Full ROM and Muscle Strength 5/5 Left Lower Extremity: Decreased ROM and Muscle Strength 5/5 Left Lower Extremity Flexion Produces Pain into her Left Patella Arrived in wheelchair     Skin:    General: Skin is warm and dry.  Neurological:     Mental Status: She is alert and oriented to person, place, and time.  Psychiatric:        Mood and Affect: Mood normal.        Behavior: Behavior normal.         Assessment & Plan:  1. Lumbar spinal stenosis with neurogenic claudication. Associated facet arthropathy/ Lumbar Radiculitis: Continue current medication regimen with Gabapentin and HEP as tolerated. 03/25/2023 Refilled: MS Contin 15 mg one tablet every 12 hours #60 and  MSIR 15 one tablet twice a day as needed  For break through pain ( 6 am and 1:00 pm)  #60.since adverse effect with insomnia with Nucynta. Nucynta discontinued due to insomnia.  We will continue the opioid monitoring program, this consists of regular clinic visits, examinations, urine drug screen, pill counts as well as use of [place] Controlled Substance Reporting system. A 12 month History has been reviewed on the [place] Controlled Substance  Reporting System 03/25/2023 2. Chronic Bilateral Thoracic Back Pain: Continue HEP as Tolerated. Continue to Monitor. 03/25/2023 3 Depression: Continue current medication Regime: Cymbalta . Continue to monitor.03/25/2023 4. Diabetes mellitus type 2 with polyneuropathy: Continue current medication regime: Gabapentin. 03/25/2023 5. Rheumatoid arthritis and osteoarthritis. Continue current medication Regime.  Voltaren Gel. Rheumatology Following. 03/25/2023 6. Interstitial lung disease: Pulmonology Following. 03/25/2023. 7. Bilateral Osteoarthritis Knee's:  Continue current medication regimen. Voltaren Gel. 03/25/2023. 8. Muscle Spasm: Continue current medication regime : Flexeril. Continue to monitor.03/25/2023 9. Bilateral Greater Trochanteric Tenderness: Continue current treatment with Ice/Heat Therapy. 03/25/2023. 10. Polyarthralgia: Rheumatology Following: Continue to monitor.03/25/2023 11. Morbid Obesitity: Continue Healthy Diet Regime and HEP. Continue to monitor.  03/25/2023 12. Cervicalgia/Cervical Radiculitis: Continue to Alternate with Heat and Ice Therapy. Continue to monitor. 03/25/2023  13. Chronic Bilateral Shoulder Pain: Continue HEP as Tolerated. Continue current medication regimen. Continue to monitor. 03/25/2023  F/U in 2 months

## 2023-03-30 ENCOUNTER — Encounter: Payer: Medicare Other | Admitting: Internal Medicine

## 2023-03-30 DIAGNOSIS — Z006 Encounter for examination for normal comparison and control in clinical research program: Secondary | ICD-10-CM

## 2023-03-30 DIAGNOSIS — J849 Interstitial pulmonary disease, unspecified: Secondary | ICD-10-CM

## 2023-03-30 NOTE — Research (Cosign Needed)
Title: Chronic Fibrosing Interstitial Lung Disease with Progressive Phenotype Prospective Outcomes (ILD-PRO) Registry    Protocol #: IPF-PRO-SUB, Clinical Trials # [id number], Sponsor: [employer]   Protocol Version Amendment 6 dated 30Apr2024  and confirmed current on 10Sep2024 Consent Version for today's visit date of  Is [facility] IR[facility] Approved Version 22May2024 Revised 29Jul2024   Objectives:  Describe current approaches to diagnosis and treatment of chronic fibrosing ILDs with progressive phenotype  Describe the natural history of chronic fibrosing ILDs with progressive phenotype  Assess quality of life from self-administered participant reported questionnaires for each disease group  Describe participant interactions with the healthcare system, describe treatment practices across multiple institutions for each disease group  Collect biological samples linked to well characterized chronic fibrosing ILDs with progressive phenotype to identify disease biomarkers  Collect data and biological samples that will support future research studies.                                            Key Inclusion Criteria: Willing and able to provide informed consent  Age >= 30 years  Diagnosis of a non-IPF ILD of any duration, including, but not limited to Idiopathic Non-Specific Interstitial Pneumonia (INSIP), Unclassifiable Idiopathic Interstitial Pneumonias (IIPs), Interstitial Pneumonia with Autoimmune Features (IPAF), Autoimmune ILDs such as Rheumatoid Arthritis (RA-ILD) and Systemic Sclerosis (SSC-ILD), Chronic Hypersensitivity Pneumonitis (HP), Sarcoidosis or Exposure-related ILDs such as asbestosis.  Chronic fibrosing ILD defined by reticular abnormality with traction bronchiectasis with or without honeycombing confirmed by chest HRCT scan and/or lung biopsy.  Progressive phenotype as defined by fulfilling at least one of the criteria below of fibrotic changes (progression set  point) within the last 24 months regardless of treatment considered appropriate in individual ILDs:  decline in FVC % predicted (% pred) based on >10% relative decline  decline in FVC % pred based on >= 5 - <10% relative decline in FVC combined with worsening of respiratory symptoms as assessed by the site investigator  decline in FVC % pred based on >= 5 - <10% relative decline in FVC combined with increasing extent of fibrotic changes on chest imaging (HRCT scan) as assessed by the site investigator  decline in DLCO % pred based on >= 10% relative decline  worsening of respiratory symptoms as well as increasing extent of fibrotic changes on chest imaging (HRCT scan) as assessed by the site investigator independent of FVC change.     Key Exclusion Criteria: Malignancy, treated or untreated, other than skin or early stage prostate cancer, within the past 5 years  Currently listed for lung transplantation at the time of enrollment  Currently enrolled in a clinical trial at the time of enrollment in this registry    Clinical Research Coordinator / Research RN note : This visit for  Subject [id number] with DOB: 1940/09/17 on 11/Nov/2024 for the above protocol is Visit/Encounter 2 and is for purpose of research.    Subject expressed continued interest and consent in continuing as a study subject. Subject confirmed that there was no change in contact information (e.g. address, telephone, email). Subject thanked for participation in research and contribution to science.     During this visit on, the subject completed the blood work and questionnaires per the above referenced protocol. Please refer to the subject's paper source binder for further details.   Signed by Despina Hick  Clinical Research Coordinator [facility]  [place], [place]

## 2023-04-03 ENCOUNTER — Telehealth: Payer: Self-pay

## 2023-04-03 MED ORDER — MORPHINE SULFATE ER 15 MG PO TBCR: 15.0000 mg | EXTENDED_RELEASE_TABLET | Freq: Two times a day (BID) | ORAL | 0 refills | Status: DC

## 2023-04-03 NOTE — Telephone Encounter (Signed)
Please send Morphine Extended Release to [facility] on [address]. Prior Rx has been cancelled. Please send new RX

## 2023-04-03 NOTE — Telephone Encounter (Signed)
MS Contin ER  prescription sent to [facility] on [address], call placed to Ms. Falardeau, no answer. Left message regarding the above.

## 2023-04-09 ENCOUNTER — Telehealth (INDEPENDENT_AMBULATORY_CARE_PROVIDER_SITE_OTHER): Payer: Medicare Other | Admitting: Internal Medicine

## 2023-04-09 DIAGNOSIS — J849 Interstitial pulmonary disease, unspecified: Secondary | ICD-10-CM | POA: Diagnosis not present

## 2023-04-09 DIAGNOSIS — J209 Acute bronchitis, unspecified: Secondary | ICD-10-CM

## 2023-04-09 DIAGNOSIS — Z86711 Personal history of pulmonary embolism: Secondary | ICD-10-CM | POA: Diagnosis not present

## 2023-04-09 MED ORDER — DOXYCYCLINE HYCLATE 100 MG PO TABS: 100.0000 mg | ORAL_TABLET | Freq: Two times a day (BID) | ORAL | 0 refills | Status: DC

## 2023-04-09 MED ORDER — PREDNISONE 10 MG PO TABS: ORAL_TABLET | ORAL | 0 refills | Status: AC

## 2023-04-09 NOTE — Progress Notes (Signed)
OV 01/03/2015  Chief Complaint  Patient presents with   Follow-up    Pt c/o DOE, resolving cough that started over the weekend, chest discomfort when SOB and when lying down. Pt c/o increasing fatigue.    She has severe class 3 dyspnea with hypoxemia on account of BMI 50, diaast dsyfn, chronic pain, deconditioning and ILD due to RA on celcept/pred/bactrim   I personally last saw her in march 2-016. Since then has seen other provicers in this office and most recently in June 2016. In June 2016 admitted for acute CHF - cath showed mild pulm htn only with PA mean 24 and minimal CAD (reviewed chart of procedures June 2016). Dx as acute diast chf. Since then better. SHe had fu HRCT 11/29/14 that shows progression in ILD since march 2016 and now with UIP pattern (personally visualized image). She is finally accepting of fact that ILD is not makin cause of dyspnea and obesity is main problem. She is wondering about dc cellcept/pred/bactrim so as to minimize polypharmacy and side effect profile    OV 02/09/2015  Chief Complaint  Patient presents with   Follow-up    Pt states her breathing is doing well the past few days. Pt recently went to [facility] ED for CP. Pt c/o prod cough with tan mucus. Pt denies CP/tightness    She has severe class 3 dyspnea with hypoxemia on account of BMI 50, diaast dsyfn, chronic pain, deconditioning and ILD due to RA. She is having progressive ILD. But given her miserable quality of life and polypharmacy and other side effects we decided to stop the CellCept and Bactrim. We also advise prednisone taper. This was all in the end of August 2016. But on 01/27/2015 she ended up in the emergency room with some respiratory difficulty. Her prednisone was bumped up. She subsequently saw her rheumatologist Dr. Algis Downs was now tapering her prednisone again. There is also some concerns of headaches and possibly temporal arteritis based on her description and apparently some kind of a biopsy  scheduled. She feels that since stopping her CellCept and Bactrim her cough is returned. Nevertheless weight and knee pain and back pain continued to be major issues for her along with polypharmacy. Her Polysorb was includes MS Contin  [name]    OV 02/21/2015  - a ACUTE VISIT  Chief Complaint  Patient presents with   Acute Visit    Pt c/o of chest tightness, productive cough with white/yellow mucus, and wheezing. Pt also c/o of feeling abdominal swelling/distention. Pt also c/o of constant fatigue. Pt states symptoms have been present x1 week.     Acute visit for this morbidly obese, rheumatoid arthritis patient with ILD who is now off CellCept  I saw her as recently as 02/09/2015. She reports that for the past 1 week she's having increased chest congestion, chest tightness, wheezing, shortness of breath, cough and yellow  sputum more than baseline. Symptoms are rated as moderate in severity. Relieved by inhaler. She has tried Mucinex for 1 week with some initial partial relief but symptoms progressed. So she made an acute visit.  She continues to stay off CellCept. She is not on prednisone 10 mg per day. She tells me Dr. Algis Downs has held her prednisone at the dose because of concerns of temporal arteritis but apparently now that his diagnoses being eliminated in the differential diagnosis. She will see Dr. Algis Downs in the next few weeks to decide on further  prednisone taper.    OV 04/16/2015  Chief Complaint  Patient presents with   Follow-up    Pt states she felt much better after completing the abx and pred. Pt states her SOB is at basline, prod cough with yellow mucus at times, pt c/o chest tightness.     Morbidly obese female with rheumatoid arthritis with interstitial lung disease UIP pattern with associated deconditioning and diastolic dysfunction  She continues to remain off CellCept. Last seen in October 2016. At that time I discharged her from the office with  doxycycline and prednisone taper. After that early November 2016 she was given another course of Doxy and prednisone burst. This did not help. Sputum culture at this time showed normal flora. She called again with the middle of November was given Levaquin and prednisone burst. She feels Levaquin helped her immensely. At this point in time she is reporting progressive dyspnea and also worsening arthritis. She wants to go back on immunomodulators again. She has discussed Imuran with Dr. Algis Downs. She is due to see Dr. Algis Downs tomorrow. She also feels that she is unable to come off prednisone. She feels at a minimum she needs 10 mg of prednisone per day. She will discuss this with Dr. D her rheumatologist tomorrow. She's also interested in a second opinion at [facility] interstitial lung disease clinic which I did discuss with her most recently. She continues to use oxygen.     OV 07/17/2015  Chief Complaint  Patient presents with   Follow-up    Pt states that she has improved. Pt c/o continued SOB with exertion, some ocugh and wheeze, and some intermittent chest tightness. Pt states that she does sleep a lot. Pt states that her heart rate remains high and that cardiology said it was due to her lungs. Pt is currently on Lasix, 80mg , but still seems to retain a lot of fluid and is not seeming to go to the bathroom as frequently.    Follow-up interstitial lung disease secondary to rheumatoid arthritis in the setting of morbid obesity, poor quality of life and heavy chronic pain opioid use and depressive symptoms  Last seen November 2016. Since then she has seen Dr. Algis Downs the rheumatology clinic. She was given instructions on Imuran which she has red but she has not decided whether she should take it or not. She subsequently has followed up with [facility] interstitial lung disease clinic. CT scan of the chest reviewed result shows possible UIP pattern 06/12/2015. She had autoimmune profile which was strongly positive for both rheumatoid  factor and cyclic citrulline peptide both markers for rheumatoid arthritis. She did see Dr. Trula Ore B on 07/02/2015. It appears that the note is not complete but according to the patient was supposed to be a multidisciplinary conference and they will get back to her. It is possible a second immunomodulators agent and is being contemplated but patient is leery of this given prior issues with opportunistic infection not otherwise specified and admissions to the hospital. In the interim she is also seen cardiology Janr 2017 Dr. Allyson Sabal who did not feel dyspnea was cardiac related. I reviewed his note  OV 10/17/2015  Chief Complaint  Patient presents with   Follow-up    Pt reports she feels improved. Pt c/o continued SOB with exertion, some wheeze and occasional cough. Pt denies CP/tightness.     Follow-up interstitial lung disease secondary to rheumatoid arthritis in the setting of morbid obesity, poor quality of life and heavy chronic pain opioid use  and depressive symptoms   Since seeing me last in February 2017. She followed up at [facility] again pulmonary clinic in March 2017. I reviewed those notes. She saw Dr. Trula Ore B on 07/31/2015. Due to the presence of bronchiectasis she's been started on  azithromycin 3 times a week. She says this has helped. In addition due to physical deconditioning and obesity she is getting home physical therapy is also help. She is interested in pulmonary rehabilitation although given obesity and back pain can be challenging. She nevertheless wants to try. She is on daily prednisone 10 mg a day. She met with Dr. Algis Downs in the rheumatology clinic and has been referred now to [facility] rheumatology clinic for second opinion for a second immunomodulators. She continues to be morbidly obese and has been losing weight. She is somewhat interested in visiting with a [facility] for weight loss.   OV 02/18/2016  Chief Complaint  Patient presents with   Follow-up     Pt states she did not go to pulm rehab d/t BIL knee pain. Pt denies change in SOB, significant cough, CP/tightness and f/c/s. Pt states she is exercising at home. pt states she is doing well overall.    Problem List: 1.  Extensive bronchiectasis - cylindrical vs. Traction - has been present and not significantly changed from 2014 to 2017                       - has history of hospitalization for what sounds like pneumonia vs. Bronchiectasis flare as far back as 1999; underwent bronchoscopy which made her feel "like a new woman" in 1999 2.  Pulmonary fibrosis - basilar predominant reticular opacities that have not change from 2014 to 2017 3.  Rheumatoid arthritis: RF 959 ; CCP >300                       - has been treated with methotrexate, biologics, etc in the past - none in the past several years                       - currently on 7.5/5mg  alter day 4.  Morbid obesity with deconditioning - has had several hospitalizations in recent months; very deconditioned at present time  - [facility] oct [facility]     Followup for above  - She now follows at [facility] rheumatology and pulmonary clinics. Last seen by pulmonologist and rheumatologist at [facility] 01/28/2016. Review of the notes suggest that they have her on a slightly lower dose of prednisone with intent to taper to 5 mg per day. He also have her on hydroxychloroquine. No further disease modifying agents are being considered for rheumatoid arthritis. In terms of her interstitial lung disease and bronchiectasis she's on conservative therapy along with Activella device. This is helping and she feels better. Today she came in walking with her walker. This the happiest I have f seen her. She's not lost any weight but she learned in the [employer] lifestyle clinic. She tried to go to pulmonary rehabilitation but could not because of knee pain. She is up-to-date with pneumonia vaccine. She's had a flu shot this  season.    OV 03/18/2016  Chief Complaint  Patient presents with   Acute Visit    Pt states breathing is not that bad today. Pt states she has cogestion build up, no tightness, SOB w/o excertion. Pt states breathing  has been better since last visit except the mucus. No cough feels the need to cough but can not.      follow-up bronchiectasis and immunosppessive thrapy. Visit 3 month olow-up. Body mssindex is stll very high but she says she has lost weight. Overal well. Last few days increasd symptoms of mucus, cough and subjective unwellness. No fever or colored sputum. But she feells she needs antibiotic and prednisone  OV 06/23/2016  Chief Complaint  Patient presents with   Follow-up    Pt states her SOB has been doing well but she has been more fatigued. Pt has an occ prod cough with light yellow mucus and chest tightness when SOB - resolves with rest.    Follow-up bronchiectasis with interstitial lung disease on immunosuppressive therapy due to rheumatoid arthritis. Last visit 01/17/2016. Since then she's lost 5 pounds of weight according to history. . She is here with her daughter. No acute bronchitis symptoms. Overall stable. The main issues that she's having increased joint pain and she is only on plaquenil. No fever or colored sputum.   OV 02/16/2017  Chief Complaint  Patient presents with   Follow-up    Pt states breathing has been okay. Stated she had a URI x1 month ago which she developed while in rehab after hip fx. Occ. SOB and occ. chest tightness. Denies any cough at this time.    Follow-up bronchiectasis with interstitial lung disease on immunosuppressive therapy due to rheumatoid arthritis. Last visit was 6 months ago approximately. Since then she has continued to lose weight through dietary control. She feels better. However in the summer 2018 she fell down due to orthostasis and fractured her left hip. This wasn't complicated by MRSA infection according to her history.  She is now undergoing physical therapy. Overall she's feeling well. She is very proud of her weight loss. Dyspnea is improved. She missed [facility] appointment for ILD clinic but she will make this again. She uses oxygen with heavy exertion at physical therapy and at night.  OV 02/11/2018  Subjective:  Patient ID: [name], female , DOB: [birth year] , age 55 y.o. , MRN: [id number] , ADDRESS: [address] [address] [place] [address]   02/11/2018 -   Chief Complaint  Patient presents with   Follow-up    Pt states she has been doing okay since last visit. States she has had some pain in the mid region of her chest x3 weeks and has also been having problems with fatigue. States she has had some occ coughing. Her main complaints are the pain in her chest and the exhaustion.     HPI Jumanah F Steidel 82 y.o. -follow-up ILD with bronchiectasis in the setting of rheumatoid arthritis and obesity and physical deconditioning  It is almost a year since I last saw her.  Since then her daughter died in [place] with advanced sarcoma.  She is getting grief counseling from hospice.  She also has palliative care support at home.  She also in the last year has fallen and hurt her back.  She is on attending physical therapy which is helping her.  She says she is lost 30 pounds in the last 1 year.  Because of all this functional status is somewhat better but still she is extremely dyspneic.  In fact when she walked 185 feet x 3 laps on room air using a walker: She is only able to complete a lap and a half and stop because of extreme shortness of breath.  Her resting pulse ox is 98%.  Final pulse ox was 98%.  And her heart rate went from 107/min to 140/min.  She has not yet had a flu shot.  Of note she is on prednisone 15 mg/day which has helped her arthritis.  She is asking if it is okay to go back down to 10 mg/day from a lung standpoint.  She is also on a different arthritis immunomodulator which is helped her  rheumatoid arthritis pain.  She gets this treatment from [facility] rheumatology.  Pulmonary ILD medications also being managed at [facility].      OV 02/10/2019  Subjective:  Patient ID: [name], female , DOB: August 25, 1940 , age 56 y.o. , MRN: [id number] , ADDRESS: [address] [place] [address] Gisella F Roanna Epley. -follow-up ILD with bronchiectasis in the setting of rheumatoid arthritis and obesity and physical deconditioning  Problem List - copy and paste from [facility] - DR Trula Ore B: 1.  Extensive bronchiectasis - cylindrical vs. Traction - has been present and not significantly changed from 2014 to 2017                       - has history of hospitalization for what sounds like pneumonia vs. Bronchiectasis flare as far back as 1999; underwent bronchoscopy which made her feel "like a new woman" in 1999 2.  Pulmonary fibrosis - basilar predominant reticular opacities that have not change from 2014 to 2017 3.  Rheumatoid arthritis: RF 959 ; CCP >300                       - has been treated with methotrexate, biologics, etc in the past - none in the past several years                       - currently on 10 mg prednisone daily 4.  Morbid obesity with deconditioning - has had several hospitalizations in recent months; very deconditioned at present time   02/10/2019 -   Chief Complaint  Patient presents with   Follow-up    Pt here for ILD follow up. Pt states her breathing is unchanged since last OV in 01/2018. Pt denies new symptoms. Pt denies CP/tightness and f/s/c.      HPI Amahia F Charles 82 y.o. -returns for follow-up.  I personally not seen her in 1 year.  In the interim she is kept up with follow-up at [facility].  In January 2020 she suffered from left lower extremity DVT after having shortness of breath.  She was admitted at [facility] and started on Eliquis.  She still continues with Eliquis.  She tells me that Harriette Ohara was  considered as the cause although she does have obesity and sedentary lifestyle and venous incompetency.  It appears this was her first DVT.  She has had a CT angiogram chest in 2005 that was negative for PE and a Doppler ultrasound in 2016 and 2018 that was negative for DVT.  She says a cardiologist here locally is managing her Eliquis and plans to stop it after several months.  In terms of her rheumatoid arthritis she is now under the care of [facility] rheumatology.  They just having her on prednisone.  In July 2020 she also saw Dr. Leonard Schwartz pulmonary at [facility].  Apparently a walk test could not be performed.  She says she has lost weight.  Her main  complaint is exertional dyspnea.  She wants to do portable oxygen.  She is only on nighttime oxygen.  In the past she always gets dyspneic way before she even desaturates a little bit this is on account of her obesity and deconditioning.  Her son is here with with her today.  This the first time I am meeting him.  He tells me that her breathing at night is rough although not as bad as his dad sleep apnea.  Patient is not on CPAP.  He is also curious about qualifying oxygen with walking.  Walking desaturation test for an 85 feet x 3 laps: She basically walked only half a lap.  Resting pulse ox was 97% with a heart rate of 102.  Final pulse ox was 96% with a heart rate of 122 and she stopped because of dyspnea.  She had a most recent CT chest there is resulted below and it is stable.   IMPRESSION: CT Chest high resolution 1. Mid and lower lung zone predominant bronchiectasis, peribronchovascular ground-glass and scattered subpleural reticular densities, grossly stable from 11/29/2014. Findings are nonspecific and can be seen with nonspecific interstitial pneumonitis. Usual interstitial pneumonitis is considered less likely but respiratory motion and expiratory phase imaging make assessment difficult. 2. Coronary artery calcification.     Electronically  Signed   By: Leanna Battles M.D.   On: 11/24/2018 13:04  ROS - per HPI  Results for LAVENDA, VILLALOVOS" (MRN [id number]) as of 02/10/2019 14:47  Ref. Range 04/17/2015 18:02 04/18/2015 13:30  D-Dimer, Sharene Butters Latest Ref Range: 0.00 - 0.50 ug/mL-FEU 2.57 (H) 2.54 (H)   OV 06/21/2019  Subjective:  Patient ID: [name], female , DOB: August 02, 1940 , age 70 y.o. , MRN: [id number] , ADDRESS: [address] [place] [address]   06/21/2019 -   Chief Complaint  Patient presents with   Follow-up    Pt states she was recently admitted to hospital at [facility] for 10 days. Since then, pt has been  having some low O2 sats and issues with SOB. Pt states she is better now than she was.   Sharra F Haynes. -follow-up ILD with bronchiectasis in the setting of rheumatoid arthritis and obesity and physical deconditioning  Problem List - copy and paste from [facility] - DR Trula Ore B: 1.  Extensive bronchiectasis - cylindrical vs. Traction - has been present and not significantly changed from 2014 to 2017                       - has history of hospitalization for what sounds like pneumonia vs. Bronchiectasis flare as far back as 1999; underwent bronchoscopy which made her feel "like a new woman" in 1999 2.  Pulmonary fibrosis - basilar predominant reticular opacities that have not change from 2014 to 2017 and in dec 2020 CT chest 3.  Rheumatoid arthritis: RF 959 ; CCP >300                       - has been treated with methotrexate, biologics, etc in the past - none in the past several years                       - currently on 10 mg prednisone daily 4.  Morbid obesity with deconditioning - has had several hospitalizations in recent months; very deconditioned at present time   HPI Jestina F Celestine 82 y.o. -admitted  approximately 2 months ago early December 2020 with Klebsiella pansensitive urinary tract infection and bilateral lower extremity cellulitis.  Discharge on 2 L nasal  cannula.  Recommendations for home physical therapy were made.  Bilateral Doppler extremities were negative in the hospital.  Her CODE STATUS is full.  She returns for routine follow-up.  Her daughter is with her.  She continues to lose weight.  She has lost around 40 to 50 pounds.  She says she is on the medication for rheumatoid arthritis I do not know which one.  She says her cellulitis was quite bad and she is suffered from it.  But nevertheless with the weight loss her hypoxemia and overall joint pain is better.  She still has significant pedal edema and erythema in her lower extremities.  She is asking for some advice with that.  Otherwise no issues.  Using 2 L nasal cannula at night.  Walks around with a walker.  Symptom score is listed b       11/22/2020- Interim hx  with NP Patient presents today for 2 week follow-up PNA. She had a CXR that showed slight increase in opacity right lung base, questionable infiltrate. Treated with Augmentin 1 tab BID x 10 days. She is feeling a great deal better. She no longer has right sided pain. She still has some wheezing and cough with yellow mucus. She is taking muciex 1,200mg  twice a day. She is using her flutter valve 3-4 times a day. She has been using nebulizer twice a day. She takes 10mg  prednisone daily. She is on Toprol XL 75mg , she has not taken this yet today. She takes Azithromycin MWF pulmonary with [facility]. She can resume this. Needs repeat CXR today.  TEST/EVENTS : High-resolution CT chest November 24, 2018 mid to lower lung bronchiectasis, groundglass reticular densities stable since 2016  [facility] 12[facility]10/20 [facility]\  Interval History:  Has been doing ok until about a month ago.  Had pneumonia on the right side a month ago. Even now, sometimes she feels that she has pain in her R chest - upper front chest. Even sometimes today, it feels kind of bad.   She was doing a lot of mucus production. They she started hurting badly on the right side.  Thought she was sleeping too much. Did not pay it any mind. Went to see the doctor and she told them about the pain. They did an xray and diagnosed with pneumonia. She did not have any fever or chills - but she says that she very seldom in her life has had temp >99. She took antibiotics. Follow up xray revealed clearing. She was put on prednisone 40 mg with taper. She still gets kind of hoarse and has mucus in her upper throat. She is scheduled now for surgery for cataracts on August 9th.   She has started getting joint pain back in her knees again.   She was out of azithromycin. She has had a hard time getting this from the [place]. Spoke with triage nurse. Needs a 90 day supply.   Using Acapella. She has been using her albuterol nebulizer at home. She still has a lot of wheezing. This wheezing sometimes clears with coughing mucus, sometimes does not. Unsure if this gets better with the nebulizer. Happens more when she is laying down.   At the local pulmonologist, she needed oxygen and now has oxygen at home. Using [employer]. She is using 2L with (sometimes 3L). This is with laying down.  When walking and moving, she keeps it on 2L.   Mucus production is getting better. She is using the acapella three or four times per day. She is using the nebulizer twice per day.   Has R chest discomfort which feels a bit worse today than prior. She says that it feels like a strain and it affects her voice. When she talks, it is like it is pulling or straining something.   Has help now every day at home. Son has been on her case about that.   Is on 10 mg prednisone - and has been on this dose for about a week.   Outside echo 06/04/2018: Study Conclusions   - Left ventricle: The cavity size was normal. There was mild focal    basal hypertrophy of the septum. Systolic function was normal.    The estimated ejection fraction was in the range of 60% to 65%.    Wall motion was normal; there were no regional wall motion     abnormalities.  - Right ventricle: The cavity size was mildly dilated. Wall    thickness was normal.  - Pulmonary arteries: Systolic pressure was mildly increased.   Echo pharm stress test with contrast 12/15/2018:  NORMAL STRESS TEST. NORMAL RESTING STUDY WITH NO WALL MOTION ABNORMALITIES AT REST AND PEAK STRESS. NO DOPPLER PERFORMED FOR VALVULAR REGURGITATION NO DOPPLER PERFORMED FOR VALVULAR STENOSIS NORMAL RESTING BP - APPROPRIATE RESPONSE  Cath done on 10/31/14:  Mild CAD without significant obstructive disease. Ectasia in all three  major proximal vessels.  Mild pulmonary hypertension. Aortic saturation 87%. Pulmonary artery  saturation 60%. Cardiac output 7.6 L. Cardiac index 3.3.  Continue medical therapy for lung disease. Continue preventative therapy  for her heart disease.Addendum by Corky Crafts, MD on 10/31/2014 12:01 PM  Mild CAD without significant obstructive disease. Ectasia in all three  major proximal vessels.  Mild pulmonary hypertension. Aortic saturation 87%. Pulmonary artery  saturation 60%. Cardiac output 7.6 L. Cardiac index 3.3.  Current Functional Status:  Oxygen: uses 2-3L of oxygen at home PRN and with sleep.  Level of exertion that leads to dyspnea: Walking any distance makes her dyspneic.   Rehab: using the walker most of the time. When she goes out, she uses a wheelchair. Home PT was helpful. She had a problem getting in and out of vehicle and bed.  Laboratory Data/Test Results: Pulmonary Function Test (PFT) Latest Ref Rng & Units 07/02/2015 12/14/2017  FVC PRE L 1.78 1.58  FVC % PRE PRED % 63 57  FEV1 PRE L 1.49 1.33  FEV1 % PRE PRED % 70 65  FEV1/FVC PRE % 83.74 84.24  FEF25-75% PRE L/s 2.83 2.39  FEF25-75% % PRE PRED % 154 141  DLCO PRE ml/(min*mmHg) 11.28 11.41  DLCO % PRE PRED % 65 67   No PFTs today.  PFTs 12/14/17: FVC 1.58, 57 % predicted. FEV1 1.33, 65% predicted. FEV1/FVC 84.24. DLCO 11.41, 67% predicted. Compared to  06/2015, FVC is down 11.2%. DLCO is essentially stable.   Echocardiogram: 11/21/2015 - EF >55% with normal wall motion. Mildly enlarged RV with normal free wall. Mildly enlarged RA. Normal respiratory collapse of IVC. Peak RV pressure 41 mmHg.   EOTT today: Room air Spo2 96% at rest. HR at start of exercise was 122 / max HR was 150 during exercise = 106% predicted. Lowest SpO2 95% on room air. Walked 258 meters = 846 feet = 81% predicted. Total walk time = 5 minutes. Borg dyspnea scale =  7. Test ended early because of dyspnea, fatigue, and chest tightness.   Labs:  None today  Impression: Patient is a 82 y.o. with PMH significant for rheumatoid arthritis, bronchiectasis (with flare vs. Pneumonia in late 2016), pulmonary fibrosis (not progressed per CT from 2014 to 06/2015), morbid obesity, and deconditioning who comes to pulmonary clinic for follow up of lung disease and dyspnea. She has been maintained on an aggressive bronchiectasis regimen (Acapella, nebulizer, TIW azithromycin) and she has done well. Unfortunately, she ran out of azithromycin, and in the setting of this, she developed increased cough and sputum and was treated for pneumonia.   Today, she feels like she is recovered from the PNA, but she is not all the way back to her baseline. She has some congestion in her throat and she is concerned about pain in her chest (which is reproducible with palpation over the sternum.  She has limited mobility at home. She uses furniture / counters to get around if she is not using her walker. She does some physical therapy.   Plan - from patient instructions:  I have renewed your azithromycin - you have a year supply that will be sent to [facility] in [place], [place] [facility] [place]). You should take this every Monday, Wednesday, Friday.   I am also going to write a prescription for a 1 week supply of DAILY azithromycin. So take it daily for 7 days, then switch to every Monday, Wednesday, Friday. The  prescription is written so that this can all be under a single prescription.   Continue to use your Acapella at least 3-4 times per day.   We are going to get another chest x ray today.   Regarding your nebulizer, we will renew albuterol order from [facility].   Plan to take your nebulizer three times per day (along with Acapella use). Once your congestion is improved, you may back off to twice per day.   Try to use your voltaren gel on your chest (nearly your sternum where it is sore) to see if this helps with your pain.   Return in about 6 months (around 06/12/2021).     OV 05/23/2021  Subjective:  Patient ID: [name], female , DOB: [birth year] , age 16 y.o. , MRN: [id number] , ADDRESS: [address] [place] [address] PCP [name], MD Patient Care Team: [name], MD as PCP - General (Family Medicine) Runell Gess, MD as PCP - Cardiology (Cardiology) Duffy, Vella Kohler, LCSW as Social Worker (Licensed Clinical Social Worker) Kidney, Moishe Spice, Janalyn Rouse, MD as Consulting Physician (Rheumatology) Darcella Cheshire, NP (Rheumatology) Adline Potter, MD as Referring Physician (Pulmonary Disease)  This Provider for this visit: Treatment Team:  Attending Provider: Kalman Shan, MD    05/23/2021 -   Chief Complaint  Patient presents with   Follow-up    Pt states since last OV, she has been in and out of the hospital. States she also ended up with Covid and has had problems with a lot of coughing and phlegm build-up. Pt's O2 sats had been dropping and had to bump O2 up to 3L    Valincia F Burek. -follow-up ILD with bronchiectasis in the setting of rheumatoid arthritis and obesity and physical deconditioning  Problem List - copy and paste from [facility] - DR Trula Ore B: 1.  Extensive bronchiectasis - cylindrical vs. Traction - has been present and not significantly changed from 2014 to 2017                       -  has history of  hospitalization for what sounds like pneumonia vs. Bronchiectasis flare as far back as 1999; underwent bronchoscopy which made her feel "like a new woman" in 1999 2.  Pulmonary fibrosis - basilar predominant reticular opacities that have not change from 2014 to 2017 and in dec 2020 CT chest 3.  Rheumatoid arthritis: RF 959 ; CCP >300                       - has been treated with methotrexate, biologics, etc in the past - none in the past several years                       - currently on 10 mg prednisone daily 4.  Morbid obesity with deconditioning - has had several hospitalizations in recent months; very deconditioned at present time  - intentl wt loss +  HPI Donnabelle F Krumholz 82 y.o. -returns for follow-up.  I personally not seen her in 2 years almost.  In between she is followed up with nurse practitioner and also [facility].  She is here with her daughter to [place] who I am meeting for the first time.  They tell me that she has had 2 admissions for dehydration in 2022.  After that she went to [facility] where in November 2022 she ended up with COVID and was given antiviral.  They were unhappy with the admission to [facility] rehab.  They do not want to go there again.  Apparently after that she is now needing 3 L of oxygen while previously she is only needing 2 L of nasal cannula oxygen.  Last CT scan of the chest echocardiogram 2020 including review of the medical records at [facility] Last pulmonary function test with [place] was 2015 while at [facility] it was in 2019.  She continues to have functional disability.  She uses a walker to get around and change her clothes.  She continues to be immunosuppressed with prednisone, Plaquenil and also leflunomide for rheumatoid arthritis.  She has chronic pain and is on opioids \\CT  Chest data  No results found.   OV 07/12/2021  Subjective:  Patient ID: [name], female , DOB: 1940-06-14 , age 51 y.o. , MRN:  [id number] , ADDRESS: [address] [place] [address] PCP [name], MD Patient Care Team: [name], MD as PCP - General (Family Medicine) Runell Gess, MD as PCP - Cardiology (Cardiology) Duffy, Vella Kohler, LCSW as Social Worker (Licensed Clinical Social Worker) Kidney, Moishe Spice, Janalyn Rouse, MD as Consulting Physician (Rheumatology) Darcella Cheshire, NP (Rheumatology) Adline Potter, MD as Referring Physician (Pulmonary Disease)  This Provider for this visit: Treatment Team:  Attending Provider: Kalman Shan, MD    07/12/2021 -   Chief Complaint  Patient presents with   Follow-up    PFT performed today.   Pt states her breathing is about the same since last visit. States she is no longer coughing up as much mucus compared to last visit.    Problem List - copy and paste from [facility] - DR Trula Ore B: 1.  Extensive bronchiectasis - cylindrical vs. Traction - has been present and not significantly changed from 2014 to 2017                       - has history of hospitalization for what sounds like pneumonia vs. Bronchiectasis flare as far back  as 1999; underwent bronchoscopy which made her feel "like a new woman" in 1999 2.  Pulmonary fibrosis - basilar predominant reticular opacities that have not change from 2014 to 2017 and in dec 2020 CT chest 3.  Rheumatoid arthritis: RF 959 ; CCP >300                       - has been treated with methotrexate, biologics, etc in the past - none in the past several years                       - currently on 10 mg prednisone daily 4.  Morbid obesity with deconditioning - has had several hospitalizations in recent months; very deconditioned at present time  - intentl wt loss +  5. COvid - Nov 2022 associated with hospitalizations for dehydration   HPI Anuja F Salzillo 82 y.o. -returns for follow-up.  She presents with her daughter [name].  She is here to review results.  She does say that after her  COVID in 07 April 2021 her dyspnea is worse and then also oxygen uses up to 3 L.  Compared to the last visit she is stable but definitely compared to a few years ago she is more short of breath and requiring more oxygen.  She also reports that since November 2022 her right fifth finger has more lateral deviation.  In her fourth finger seems to be in a fixed abnormality.  She did try a short course prednisone with her primary care physician and it does not help.  She has rheumatology appointment pending with Dr. Lenis Noon at [facility] next month.  Her test results show that her echo is essentially normal.  No comment about pulmonary hypertension.  Her pulmonary function test shows FVC declined compared to 2019 at [facility] and also 2015 with [place].  This correlates with increased shortness of breath over the last few years increased subjective oxygen need.  She did have a high-resolution CT scan of the chest that our radiologist feels there is progression and she has UIP pattern with emerging honeycombing.  I personally visualized various CT scans of the chest.  I am not so sure about progression but I did tell her that I would put this up for multidisciplinary case conference.  We discussed care for progressive interstitial lung disease in the setting of rheumatoid arthritis.  Did discuss about registry protocol and I gave her the consent form.  She is interested in this.  Also discussed about nintedanib antifibrotic.  Did discuss about diarrhea as a side effect and GI issues.  She says she is constipated she and her daughter feel with polypharmacy that she should easily be able to handle this 1 other medication.  They are interested in this.  They are willing to wait till multidisciplinary case conference is done.  We also discussed the fact with WHO group 3 pulmonary hypertension inhaled treprostinil as an option.  She is never had a right heart cath.  I have written to Dr. Nanetta Batty her  cardiologist to consider right heart catheterization.  We will regroup in a couple of months.    MARCH 2023 ILD condrerence with Dr Dorothey Baseman  "Dalma Ersa Senseney DOB: 10-29-1940 MRN:  [id number] Female, 82 y.o." Dr. Marchelle Gearing "   RA with ILD . Stable for years but then covid nov 2022 Rx as outpaitent. Since then o2 needs 2 -> 3L /PFT - slightly worse  FVC ? technique issue becase could not do dlco. CT reported as UIP and worse but MR not sure.   Question: is CT really worse? If so, she qualifies for registry study and ofev. "    "HRCT: 06/19/2021 HRCT: 11/24/2018 HRCT: 11/29/2014 HRCT: 07/20/2014 HRCT: 10/05/2013 HRCT: 04/08/2013" No lung biopsy    Concordant Between MDD and official read  On laest scan there is ILD and Tracheobroncholamacia and also diffuse Traction bronchiectasis without ILD. In LL there is bialteral LL HC., subpleural disease UIP that is unusual - suggestive of CTD.     Can approach as ILD     xxxxxxxxxxxxxxxx                   [facility] 08/12/21  HEMODYNAMICS:    1: Right atrial pressure-20/18 2: Right ventricular pressure-37/17, mean 22 3: Pulmonary artery pressure-43/19, mean 30 4: Pulmonary wedge pressure-A-wave 23, V wave 20, mean 22 - does not meet tyvaso criteria 5: Cardiac output-4.97 L/min with an index of 2.43 L/min/m 6: PVR-1.81 -0 does not meet tyvaso cirtiera     IMPRESSION: Ms. Mooty has moderately elevated right atrial pressures and mildly elevated pulmonary artery pressures.  Her PA saturations were 58%.  The sheath was removed and a bandage was placed over the right antecubital puncture site.  The patient left the lab in stable condition.  She will be discharged home later this morning and to follow-up with Dr. Marchelle Gearing and myself.   Nanetta Batty. MD, [facility] 08/12/2021 8:06   OV 09/13/2021  Subjective:  Patient ID: [name], female , DOB: 12-25-1940 , age 64 y.o. , MRN: [id number] , ADDRESS: [address] [place]  [address] PCP [name], MD Patient Care Team: [name], MD as PCP - General (Family Medicine) Runell Gess, MD as PCP - Cardiology (Cardiology) Duffy, Vella Kohler, LCSW as Social Worker (Licensed Clinical Social Worker) Kidney, Moishe Spice, Janalyn Rouse, MD as Consulting Physician (Rheumatology) Darcella Cheshire, NP (Rheumatology) Adline Potter, MD as Referring Physician (Pulmonary Disease)  This Provider for this visit: Treatment Team:  Attending Provider: Kalman Shan, MD    09/13/2021 -   Chief Complaint  Patient presents with   Follow-up    Pt states she has been doing okay since last visit. States her breathing is about the same.   Problem List - copy and paste from [facility] - DR Trula Ore B: 1.  Extensive bronchiectasis - cylindrical vs. Traction - has been present and not significantly changed from 2014 to 2017                       - has history of hospitalization for what sounds like pneumonia vs. Bronchiectasis flare as far back as 1999; underwent bronchoscopy which made her feel "like a new woman" in 1999 2.  Pulmonary fibrosis - basilar predominant reticular opacities that have not change from 2014 to 2017 and in dec 2020 CT chest 3.  Rheumatoid arthritis: RF 959 ; CCP >300                       - has been treated with methotrexate, biologics, etc in the past - none in the past several years                       - currently on 10 mg prednisone daily 4.  Morbid obesity with deconditioning - has had several hospitalizations  in recent months; very deconditioned at present time  - intentl wt loss +  5. COvid - Nov 2022 associated with hospitalizations for dehydratio  HPI Walta F Kemmerer 82 y.o. -returns for follow-up.  After the last visit we discussed the multidisciplinary case conference.  Independent radiologist concurred there was UIP and she has had slow progression.  She had right heart catheterization and there is no pulmonary  hypertension to qualify her for inhaled treprostinil based on WHO group 3.  Overall she is better from a RA perspective.  She is doing physical therapy.  No new complaints.  She is still following a strict diet she is lost some more weight.  We discussed nintedanib as an antifibrotic.  She wants to do this.  She has constipation.  Did discuss that sometimes she could have diverticulitis and abdominal . Which is very rare but have seen 1 or 2 patients in the setting of opioids do this especially with nintedanib.  She is accepting of the risk.  Discussed rare possibilities of bleeding and MI.  She is understanding.  She wants to start the medication.    OV 02/04/2022  Subjective:  Patient ID: [name], female , DOB: 16-Feb-1941 , age 82 y.o. , MRN: [id number] , ADDRESS: [address] [place] [address] PCP [name], MD Patient Care Team: [name], MD as PCP - General (Family Medicine) Runell Gess, MD as PCP - Cardiology (Cardiology) Duffy, Vella Kohler, LCSW as Social Worker (Licensed Clinical Social Worker) [place], [facility] Pollyann Savoy, MD as Consulting Physician (Rheumatology) Darcella Cheshire, NP (Rheumatology) Adline Potter, MD as Referring Physician (Pulmonary Disease)  This Provider for this visit: Treatment Team:  Attending Provider: Kalman Shan, MD    02/04/2022 -   Chief Complaint  Patient presents with   Follow-up    Follow up after PFT. Pt fell on Sunday and she fell a week before that as well.     HPI Shila F Buchko 82 y.o. -returns for follow-up.  She says that few days ago she had slurred speech noticed by the family members well on the telephone and later when she went to the bathroom she fell patient sustained some injuries.  She went to ER all the imaging work-up as listed below.  Everything was reassuring.  The suspecting a TIA and she has been referred to a neurologist.  Her blood work 02/02/2022  was fairly unremarkable.  She has been on nintedanib since I last saw her she is only in the low-dose 100 mg twice daily because of her polypharmacy and overall poor functional status but even with that she is having significant diarrhea and fatigue.  All her symptom scores are worse.  The son also feels that her overall general wellbeing is decline in the last few weeks.  She definitely attributes the diarrhea to the nintedanib.  She is not sure about the fatigue but she believes it could be from the nintedanib.  There is no GI discomfort other than diarrhea.    CT Chest data - HRT FEb 2023  Narrative & Impression  CLINICAL DATA:  82 year old female with history of chest tightness and shortness of breath. Prior history of COVID infection in December 2022.   EXAM: CT CHEST WITHOUT CONTRAST   TECHNIQUE: Multidetector CT imaging of the chest was performed following the standard protocol without intravenous contrast. High resolution imaging of the lungs, as well as inspiratory and expiratory imaging, was performed.  RADIATION DOSE REDUCTION: This exam was performed according to the departmental dose-optimization program which includes automated exposure control, adjustment of the mA and/or kV according to patient size and/or use of iterative reconstruction technique.   COMPARISON:  Chest CT 01/16/2021.   FINDINGS: Cardiovascular: Heart size is normal. There is no significant pericardial fluid, thickening or pericardial calcification. There is aortic atherosclerosis, as well as atherosclerosis of the great vessels of the mediastinum and the coronary arteries, including calcified atherosclerotic plaque in the left anterior descending coronary artery.   Mediastinum/Nodes: No pathologically enlarged mediastinal or hilar lymph nodes. Esophagus is unremarkable in appearance. No axillary lymphadenopathy.   Lungs/Pleura: High-resolution images demonstrate some patchy areas of  ground-glass attenuation, septal thickening, subpleural reticulation, mild cylindrical traction bronchiectasis, peripheral bronchiolectasis and a few areas of apparent developing honeycombing. These findings have a definitive craniocaudal gradient and appear minimally progressive compared to the recent prior examination. Inspiratory and expiratory imaging demonstrates some mild air trapping indicative of mild small airways disease. No acute consolidative airspace disease. No pleural effusions. No definite suspicious appearing pulmonary nodules or masses are noted.   Upper Abdomen: Unremarkable.   Musculoskeletal: There are no aggressive appearing lytic or blastic lesions noted in the visualized portions of the skeleton.   IMPRESSION: 1. The appearance of the lungs is considered diagnostic of usual interstitial pneumonia (UIP) per current ATS guidelines, demonstrating mild progression compared to prior examinations. 2. Aortic atherosclerosis, in addition to left anterior descending coronary artery disease. Please note that although the presence of coronary artery calcium documents the presence of coronary artery disease, the severity of this disease and any potential stenosis cannot be assessed on this non-gated CT examination. Assessment for potential risk factor modification, dietary therapy or pharmacologic therapy may be warranted, if clinically indicated.   Aortic Atherosclerosis (ICD10-I70.0).     Electronically Signed   By: Trudie Reed M.D.   On: 06/21/2021 07:48    OV 04/01/2022  Subjective:  Patient ID: [name], female , DOB: July 03, 1940 , age 82 y.o. , MRN: [id number] , ADDRESS: [address] [place] [address] PCP Anson Fret, MD Patient Care Team: Anson Fret, MD as PCP - General (Neurology) Runell Gess, MD as PCP - Cardiology (Cardiology) Duffy, Vella Kohler, LCSW as Social Worker (Licensed Clinical Social Worker) [place],  [facility] Pollyann Savoy, MD as Consulting Physician (Rheumatology) Darcella Cheshire, NP (Rheumatology) Adline Potter, MD as Referring Physician (Pulmonary Disease)  This Provider for this visit: Treatment Team:  Attending Provider: Kalman Shan, MD    04/01/2022 -   Chief Complaint  Patient presents with   Follow-up    Pt states she has been doing okay since last visit.     HPI Keyasia F Huntsberger 82 y.o. -after seeing me mid September 2023 we stopped nintedanib because of severe diarrhea.  The diarrhea resolved pretty much within a few days.  After that she was doing okay but was having intermittent nausea.  1 such episode was severe on 03/01/2022 approximately 3 weeks after stopping nintedanib] she ended up in the ER.  Was found to have: Adherent to the bladder and the ovary.  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  This was the sigmoid colon.  She also has intermittent left lower quadrant pain.  She was seen by Willette Cluster nurse practitioner 03/11/2022.  A colonoscopy has been scheduled.  03/21/2022 cardiology clearance was obtained.    Otherwise, from a shortness of breath perspective she is stable.  There is no changes here  From a fall perspective she is seen neurology 03/06/2022.  She saw Dr. Daisy Blossom.  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  Also multifocal peripheral predominant bilateral chronic microhemorrhage may indicate cerebral amyloid angiopathy.  Mini-Mental scale was 22/30 Aricept is being started.Today she is here with her daughter Casimer Bilis.  She was able to understand everything I was saying and giving a good history.  She gave all the details about her neurology visit GI visit all accurately.  Also the ER visit.  CT Chest data  OV 05/27/2022  Subjective:  Patient ID: [name], female , DOB: June 08, 1940 , age 52 y.o. , MRN: [id number] , ADDRESS: [address] [place] [address] PCP [name],  MD Patient Care Team: [name], MD as PCP - General (Family Medicine) Runell Gess, MD as PCP - Cardiology (Cardiology) Duffy, Vella Kohler, LCSW as Social Worker (Licensed Clinical Social Worker) [place], [facility] Pollyann Savoy, MD as Consulting Physician (Rheumatology) Darcella Cheshire, NP (Rheumatology) Adline Potter, MD as Referring Physician (Pulmonary Disease)  This Provider for this visit: Treatment Team:  Attending Provider: Kalman Shan, MD 05/27/2022 -   Chief Complaint  Patient presents with   Follow-up    Some SOB and pain on right breast area.  Not every day.     HPI Estelita F Staub 82 y.o. -presents for follow-up with her daughter.  Patient is giving most of the history but daughter is corroborating certain key parts.  Since her last visit she visited Dr. Raynelle Fanning freed in the interstitial lung disease clinic at [facility].  Her assessment was to hold off on pirfenidone given patient's current GI issue and very slow progression only.  I concur with this.  I reviewed her note.  That was the main question for this visit.  From a ILD perspective she continues to be stable compared to last visit.  There is no pulmonary function test this visit but symptom wise she is stable.  Her main issues are GI issues.  She has seen Dr. Axel Filler at [facility] surgery in [place] in December 2023.  I reviewed his note as well.  He has recommended very conservative line of management with MiraLAX.  The belief is that opioids with chronic constipation caused the diverticulitis.  We did do some vaccine counseling.  She has had a flu shot but she is yet to have her RSV vaccine and also shingles vaccine.  We discussed about this.  She has had a COVID vaccines      OV 12/22/2022  Subjective:  Patient ID: [name], female , DOB: 12/30/1940 , age 36 y.o. , MRN: [id number] , ADDRESS: [address] [place] [address] PCP  [name], MD Patient Care Team: [name], MD as PCP - General (Family Medicine) Runell Gess, MD as PCP - Cardiology (Cardiology) Duffy, Vella Kohler, LCSW as Social Worker (Licensed Clinical Social Worker) [place], [facility] Pollyann Savoy, MD as Consulting Physician (Rheumatology) Darcella Cheshire, NP (Rheumatology) Adline Potter, MD as Referring Physician (Pulmonary Disease) Sherrill Raring, RPH (Inactive) (Pharmacist)  This Provider for this visit: Treatment Team:  Attending Provider: Kalman Shan, MD  12/22/2022 -   Chief Complaint  Patient presents with   Follow-up    F/up on PFT     HPI Shelisha F Duren 82 y.o. -presents for follow-up.  She tells me that her shortness of breath  is stable.  pulmonary function test shows recent stability.  There are no issues here.  Her antifibrotic's are on hold after nintedanib related GI issues and subsequent ongoing diverticular issues.  Most recently last week she was admitted for lower GI bleed.  Eliquis was held but she was told to restart.  She has not restarted this because of fear of recurrent bleeding.  She is also having chronic back pain and she wants to have spinal injections.  She wants to know if it is okay for her to hold her Eliquis.  She has had PE 1 year ago but remotely she did have some DVT as well.  At this point in time Eliquis on hold no active bleeding.  We did discuss the pros and cons of holding Eliquis.  Did indicate for the moment that we can hold.  This will allow her to get some control with her bleeding and help her anemia resolve and also get some spinal injections.  We can check D-dimer [she specifically asked for her D-dimer to be checked] I suspect it will still be elevated because of recent hospitalization and then recheck again in a few months.  If it is still elevated we can consider low-dose Eliquis.  She is agreeable with this plan.        OV  04/09/2023  Subjective:  Patient ID: [name], female , DOB: Jun 29, 1940 , age 26 y.o. , MRN: [id number] , ADDRESS: [address] [place] [address] PCP [name], MD Patient Care Team: [name], MD as PCP - General (Family Medicine) Runell Gess, MD as PCP - Cardiology (Cardiology) Duffy, Vella Kohler, LCSW as Social Worker (Licensed Clinical Social Worker) [place], [facility] Pollyann Savoy, MD as Consulting Physician (Rheumatology) Darcella Cheshire, NP (Rheumatology) Adline Potter, MD as Referring Physician (Pulmonary Disease) Sherrill Raring, [facility] (Pharmacist)  This Provider for this visit: Treatment Team:  Attending Provider: Kalman Shan, MD    Problem List - copy and paste from [facility] - DR Trula Ore B: 1.  Extensive bronchiectasis - cylindrical vs. Traction - has been present and not significantly changed from 2014 to 2017                       - has history of hospitalization for what sounds like pneumonia vs. Bronchiectasis flare as far back as 1999; underwent bronchoscopy which made her feel "like a new woman" in 1999 2.  Pulmonary fibrosis - basilar predominant reticular opacities that have not change from 2014 to 2017 and in dec 2020 CT chest 3.  Rheumatoid arthritis: RF 959 ; CCP >300                       - has been treated with methotrexate, biologics, etc in the past - none in the past several years                       - currently on 10 mg prednisone daily    -Started nintedanib?  Approximately May 2023 but stopped September 2023 because of significant diarrhea   -Joint ILD-Pro registry 04/01/2022 3b.  Normal right heart catheterization March 2023 4.  Morbid obesity with deconditioning - has had several hospitalizations in recent months; very deconditioned at present time  - intentl wt loss +  5. COvid - Nov 2022 associated with hospitalizations for dehydratin  6. June 2023 : subsgmental PE  and  started on eliquis   - didimer 4.4 in aug 2024  7. Augu 2024/July 2024: Admission for lower GI bleed rectal bleed nadir hemoglobin 9 g% December 16, 2022   Talajah BLAIRE KRITIKOS 82 y.o. -   04/09/2023 -  ACUTE VISIT    Type of visit: Video Virtual Visit Identification of patient Shaneese SHANTERA RAGAINS with 07-16-1940 and MRN [id number] - 2 person identifier Risks: Risks, benefits, limitations of telephone visit explained. Patient understood and verbalized agreement to proceed Anyone else on call: just patiet and family member heard from the back Patient location: her home This provider location: [address]; [place]; [place] [address]. [facility]. [phone]   Acute video visit.  She tells me that for the last 3 days she has had increased cough mucus that is consisting of white and yellow sputum.  Change in voice.  In fact when she was talking to me a voice change increased shortness of breath increased congestion.  No fever no chills no hemoptysis no bleeding episodes.  Remains off anticoagulation.  Lab review shows even in August 2024 D-dimer was high but this was around the time of GI bleed.  No further pulmonary embolism after she has stopped her anticoagulation in summer 2024.  No further GI bleed either.  No ER visits no hospitalizations no surgeries no urgent care visits.  No hemoptysis no edema no weight change.  There is no sick contacts.  She has not tested her COVID.  She feels overall pulmonary fibrosis stable although occasionally she has had some oxygen drops.      SYMPTOM SCALE - ILD 06/21/2019 Birth weight not on file Last Weight  Most recent update: 06/21/2019 11:53 AM    Weight  113.3 kg (249 lb 12.8 oz)             05/23/2021 224# (used to 300# some yearsaago) 07/12/2021 224# 02/04/2022 Ofev  04/01/2022 Off ofev 12/22/2022   O2 use 2L nithg 3L 3L  Ra rest.  Nighttime oxygen and daytime as needed   Shortness of Breath 0 -> 5 scale with 5 being  worst (score 6 If unable to do)       At rest 2 2 3 2  0   Simple tasks - showers, clothes change, eating, shaving 5 4 4 3 2    Household (dishes, doing bed, laundry) 4  5 4 2    Shopping 6  5 4 5    Walking level at own pace 3 4 5 5 4    Walking up Stairs 6  x 0 5   Total (30-36) Dyspnea Score 26 3 22 19 18    How bad is your cough? 3 3 2 5 1    How bad is your fatigue 0 0 4 5 4    How bad is nausea 0 0 0 0 0 to 4   How bad is vomiting?  0 0 0 0 0   How bad is diarrhea? 0 0 0 5 0   How bad is anxiety? 2 4 3 5 4    How bad is depression 2 3 3 5 4      HPI    PFT     Latest Ref Rng & Units 12/22/2022   10:33 AM 02/04/2022    3:06 PM 07/12/2021    2:29 PM 01/12/2014   12:59 PM 09/12/2013   10:02 AM 04/28/2013   10:07 AM  ILD indicators  FVC-Pre L 1.49  1.47  1.29  1.59  1.78  1.73   FVC-Predicted Pre % 58  62  70  76  77  74   FVC-Post L    1.46  1.54  1.19   FVC-Predicted Post %    70  66  51   TLC L    3.55  3.57  2.94   TLC Predicted %    74  70  58   DLCO uncorrected ml/min/mmHg  11.92  -0.03  16.69  12.50    DLCO UNC %Pred %  67  0  77  51    DLCO Corrected ml/min/mmHg  11.89  -0.03      DLCO COR %Pred %  67  0       Allergies  Allergen Reactions   Codeine Swelling and Other (See Comments)    Facial swelling, Chest pain, and swelling in legs    Infliximab Anaphylaxis    (REMICADE) "sent me into shock"    Lisinopril Swelling and Rash    Face and neck swelling    Ofev [Nintedanib] Diarrhea      LAB RESULTS last 96 hours No results found.  LAB RESULTS last 90 days Recent Results (from the past 2160 hour(s))  D-Dimer, Quantitative     Status: Abnormal   Collection Time: 01/12/23 11:14 AM  Result Value Ref Range   D-Dimer, Quant 4.41 (H) <0.50 mcg/mL FEU    Comment: . The D-Dimer test is used frequently to exclude an acute PE or DVT. In patients with a low to moderate clinical risk assessment and a D-Dimer result <0.50 mcg/mL FEU, the likelihood of a PE or DVT  is very low. However, a thromboembolic event should not be excluded solely on the basis of the D-Dimer level. Increased levels of D-Dimer are associated with a PE, DVT, DIC, malignancies, inflammation, sepsis, surgery, trauma, pregnancy, and advancing patient age. [Jama 2006 11:295(2):199-207] . For additional information, please refer to: http://education.[facility].com/faq/FAQ149 (This link is being provided for informational/ educational purposes only) .   Drug Tox Monitor 1 w/Conf, Oral Fld     Status: Abnormal   Collection Time: 01/28/23 11:29 AM  Result Value Ref Range   Amphetamines NEGATIVE <10 ng/mL   Barbiturates NEGATIVE <10 ng/mL   Benzodiazepines NEGATIVE <0.50 ng/mL   Buprenorphine NEGATIVE <0.10 ng/mL   Cocaine NEGATIVE <5.0 ng/mL   Fentanyl NEGATIVE <0.10 ng/mL   Heroin Metabolite NEGATIVE <1.0 ng/mL   MARIJUANA NEGATIVE <2.5 ng/mL   MDMA NEGATIVE <10 ng/mL   Meprobamate NEGATIVE <2.5 ng/mL   Methadone NEGATIVE <5.0 ng/mL   Nicotine Metabolite NEGATIVE <5.0 ng/mL   Opiates POSITIVE (A) <2.5 ng/mL   Codeine Negative <2.5 ng/mL   Dihydrocodeine Negative <2.5 ng/mL   Hydrocodone Negative <2.5 ng/mL   Hydromorphone Negative <2.5 ng/mL   Morphine 41.1 (H) <2.5 ng/mL    Comment: . Morphine is a metabolite of codeine as well as a prescribed drug, and also can be observed following ingestion of products containing poppy seeds. .    Norhydrocodone Negative <2.5 ng/mL   Noroxycodone Negative <2.5 ng/mL   Oxycodone Negative <2.5 ng/mL   Oxymorphone Negative <2.5 ng/mL   Phencyclidine NEGATIVE <10 ng/mL   Tapentadol NEGATIVE <5.0 ng/mL   Tramadol NEGATIVE <5.0 ng/mL   Zolpidem NEGATIVE <5.0 ng/mL    Comment: . For additional information, please refer to http://education.[facility].com/faq/FAQ186 (This link is being provided for informational/ educational purposes only.) . This drug testing is for medical treatment only. Analysis was performed as  non-forensic  testing and these results should be used only by healthcare providers to render diagnosis or treatment, or to monitor progress of medical conditions. . For assistance with interpreting these drug results, please contact a [employer] Toxicology Specialist: [phone] TOX [phone]), M-F, 8am-6pm EST. [name] These tests were developed and their analytical performance characteristics have been determined by [facility]. They have not been cleared or approved by the FDA. These assays have been validated pursuant to the CLIA regulations and are used for clinical purposes.   Drug Tox Alc Metab w/Con, Oral Fld     Status: None   Collection Time: 01/28/23 11:29 AM  Result Value Ref Range   Alcohol Metabolite NEGATIVE <25 ng/mL    Comment: . For additional information, please refer to http://education.questdiagn[facility].com/faq/FAQ183 (This link is being provided for informational/ educational purposes only.) . This drug testing is for medical treatment only. Analysis was performed as non-forensic testing and these results should be used only by healthcare providers to render diagnosis or treatment, or to monitor progress of medical conditions. . For assistance with interpreting these drug results, please contact a [employer] Toxicology Specialist: [phone] TOX [phone]), M-F, 8am-6pm EST. [name] These tests were developed and their analytical performance characteristics have been determined by [facility]. They have not been cleared or approved by the FDA. These assays have been validated pursuant to the CLIA regulations and are used for clinical purposes.          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   reports that she has never smoked. She has never used smokeless tobacco.  Past Surgical History:  Procedure Laterality Date   BRAIN SURGERY     Gamma knife 10/13. Needs repeat spring  '14   CARDIAC CATHETERIZATION N/A 10/31/2014   Procedure: Right/Left Heart Cath and Coronary Angiography;  Surgeon: Corky Crafts, MD;  Location: [facility] INVASIVE CV LAB;  Service: Cardiovascular;  Laterality: N/A;   COLONOSCOPY WITH PROPOFOL N/A 04/14/2022   Procedure: COLONOSCOPY WITH PROPOFOL;  Surgeon: Beverley Fiedler, MD;  Location: [facility] ENDOSCOPY;  Service: Gastroenterology;  Laterality: N/A;   CYST EXCISION     2 on face   CYST REMOVAL NECK     ESOPHAGOGASTRODUODENOSCOPY (EGD) WITH PROPOFOL N/A 09/14/2014   Procedure: ESOPHAGOGASTRODUODENOSCOPY (EGD) WITH PROPOFOL;  Surgeon: Louis Meckel, MD;  Location: [facility] ENDOSCOPY;  Service: Endoscopy;  Laterality: N/A;   INCISION AND DRAINAGE HIP Left 01/16/2017   Procedure: IRRIGATION AND DEBRIDEMENT LEFT HIP;  Surgeon: Kathryne Hitch, MD;  Location: [facility] ORS;  Service: Orthopedics;  Laterality: Left;   INTRAMEDULLARY (IM) NAIL INTERTROCHANTERIC Left 11/29/2016   Procedure: INTRAMEDULLARY (IM) NAIL INTERTROCHANTRIC;  Surgeon: Kathryne Hitch, MD;  Location: [facility] OR;  Service: Orthopedics;  Laterality: Left;   OVARY SURGERY      RIGHT HEART CATH N/A 08/12/2021   Procedure: RIGHT HEART CATH;  Surgeon: Allyson Sabal,  Delton See, MD;  Location: [facility] INVASIVE CV LAB;  Service: Cardiovascular;  Laterality: N/A;   SHOULDER SURGERY Left    TONSILLECTOMY  age 32   VAGINAL HYSTERECTOMY     VIDEO BRONCHOSCOPY Bilateral 05/31/2013   Procedure: VIDEO BRONCHOSCOPY WITHOUT FLUORO;  Surgeon: Kalman Shan, MD;  Location: [facility] ENDOSCOPY;  Service: Cardiopulmonary;  Laterality: Bilateral;   video bronscoscopy  05/19/1998   lung    Allergies  Allergen Reactions   Codeine Swelling and Other (See Comments)    Facial swelling, Chest pain, and swelling in legs    Infliximab Anaphylaxis    (REMICADE) "sent me into shock"    Lisinopril Swelling and Rash    Face and neck swelling    Ofev [Nintedanib] Diarrhea    Immunization History  Administered Date(s) Administered   Fluad Quad(high Dose 65+) 02/02/2019, 03/11/2021, 04/01/2022   Fluad Trivalent(High Dose 65+) 03/02/2023   Influenza Split 01/29/2012, 03/30/2013, 02/10/2014   Influenza, High Dose Seasonal PF 02/16/2017, 02/11/2018, 01/31/2019, 02/27/2020   Influenza,inj,Quad PF,6+ Mos 02/09/2015, 12/18/2015   Influenza-Unspecified 02/09/2015, 12/18/2015   PFIZER Comirnaty(Gray Top)Covid-19 Tri-Sucrose Vaccine 07/24/2019, 08/14/2019, 02/27/2020   PFIZER(Purple Top)SARS-COV-2 Vaccination 07/17/2019, 08/14/2019   PPD Test 04/23/2015   Pneumococcal Conjugate-13 08/21/2014   Pneumococcal Polysaccharide-23 02/17/2012   Tdap 12/27/2015    Family History  Problem Relation Age of Onset   Diabetes Mother    Heart attack Mother    Hypertension Father    Lung cancer Father    Diabetes Sister    Breast cancer Sister    Breast cancer Sister    Diabetes Brother    Hypertension Brother    Heart disease Brother    Heart attack Brother    Kidney cancer Brother    Gout Brother    Kidney failure Brother        x 5   Esophageal cancer Brother    Stomach cancer Brother    Prostate  cancer Brother    Uterine cancer Daughter        with mets   Fibroids Daughter    Hypertension Daughter    Hypertension Daughter    Hypertension Son    Heart murmur Son    Rheum arthritis Maternal Uncle    Alzheimer's disease Neg Hx    Dementia Neg Hx      Current Outpatient Medications:    acetaminophen (TYLENOL) 500 MG tablet, Take 1,000 mg by mouth as needed for moderate pain., Disp: , Rfl:    albuterol (PROVENTIL) (2.5 MG/3ML) 0.083% nebulizer solution, Take 3 mLs (2.5 mg total) by nebulization every 6 (six) hours as needed for wheezing. (Patient taking differently: Take 2.5 mg by nebulization 2 (two) times daily as needed for wheezing or shortness of breath.), Disp: 75 mL, Rfl: 0   albuterol (VENTOLIN HFA) 108 (90 Base) MCG/ACT inhaler, Inhale 2 puffs into the lungs as needed for wheezing or shortness of breath., Disp: , Rfl:    allopurinol (ZYLOPRIM) 300 MG tablet, Take 1 tablet (300 mg total) by mouth daily., Disp: 30 tablet, Rfl: 0   apixaban (ELIQUIS) 5 MG TABS tablet, Take 1 tablet (5 mg total) by mouth 2 (two) times daily., Disp: 180 tablet, Rfl: 1   Artificial Tear Ointment (DRY EYES OP), Place 2 drops into both eyes as needed (dry eyes)., Disp: , Rfl:    atorvastatin (LIPITOR) 10 MG tablet, Take 1 tablet (10 mg total) by mouth daily. (Patient taking differently: Take 10 mg by mouth at bedtime.), Disp: 90 tablet,  Rfl: 3   azithromycin (ZITHROMAX) 250 MG tablet, Take 1 tablet (250 mg total) by mouth every Monday, Wednesday, and Friday. Take every Monday, Wednesday, Friday., Disp: 12 tablet, Rfl: 11   Calcium Carb-Cholecalciferol (CALCIUM + D3 PO), Take 2 tablets by mouth daily., Disp: , Rfl:    Camphor-Menthol-Methyl Sal (SALONPAS EX), Apply 1 patch topically as needed (shoulder and leg pain)., Disp: , Rfl:    Cholecalciferol (VITAMIN D3) 50 MCG (2000 UT) capsule, Take 2,000 Units by mouth daily., Disp: , Rfl:    colchicine 0.6 MG tablet, Take 1 tablet (0.6 mg total) by mouth  daily as needed (gout flares)., Disp: 30 tablet, Rfl: 0   cyclobenzaprine (FLEXERIL) 5 MG tablet, Take 1 tablet (5 mg total) by mouth at bedtime as needed for muscle spasms., Disp: 90 tablet, Rfl: 2   diclofenac Sodium (VOLTAREN) 1 % GEL, Apply 2 g topically 3 (three) times daily. Apply to both hands and knees (Patient taking differently: Apply 1 Application topically in the morning, at noon, and at bedtime.), Disp: 100 g, Rfl: 1   donepezil (ARICEPT) 10 MG tablet, Take 1 tablet (10 mg total) by mouth at bedtime., Disp: 90 tablet, Rfl: 4   DULoxetine (CYMBALTA) 60 MG capsule, Take 1 capsule (60 mg total) by mouth daily., Disp: 90 capsule, Rfl: 2   etanercept (ENBREL SURECLICK) 50 MG/ML injection, Inject 50 mg into the skin once a week., Disp: , Rfl:    fluticasone (FLONASE) 50 MCG/ACT nasal spray, Place 1 spray into both nostrils daily. (Patient taking differently: Place 2 sprays into both nostrils daily.), Disp: 16 g, Rfl: 3   furosemide (LASIX) 80 MG tablet, TAKE 1 TABLET(80 MG) BY MOUTH DAILY, Disp: 30 tablet, Rfl: 11   gabapentin (NEURONTIN) 600 MG tablet, Take 2 tablets (1,200 mg total) by mouth 3 (three) times daily., Disp: 540 tablet, Rfl: 1   guaiFENesin (MUCINEX) 600 MG 12 hr tablet, Take 2 tablets (1,200 mg total) by mouth 2 (two) times daily., Disp: 120 tablet, Rfl: 0   hydrALAZINE (APRESOLINE) 25 MG tablet, TAKE 1 TABLET(25 MG) BY MOUTH TWICE DAILY, Disp: 180 tablet, Rfl: 2   hydroxychloroquine (PLAQUENIL) 200 MG tablet, Take 1 tablet (200 mg total) by mouth daily., Disp: 30 tablet, Rfl: 0   leflunomide (ARAVA) 20 MG tablet, Take 1 tablet (20 mg total) by mouth daily., Disp: 30 tablet, Rfl: 0   levothyroxine (SYNTHROID) 50 MCG tablet, Take 1 tablet (50 mcg total) by mouth daily., Disp: 90 tablet, Rfl: 3   lidocaine (LIDODERM) 5 %, Place 1 patch onto the skin daily. Remove & Discard patch within 12 hours or as directed by MD, Disp: 60 patch, Rfl: 11   linaclotide (LINZESS) 72 MCG capsule,  Take 1 capsule (72 mcg total) by mouth daily before breakfast., Disp: 90 capsule, Rfl: 3   Menthol, Topical Analgesic, (BIOFREEZE ROLL-ON EX), Apply 1 Application topically as needed (pain)., Disp: , Rfl:    metolazone (ZAROXOLYN) 2.5 MG tablet, TAKE 1 TABLET BY MOUTH DAILY AS NEEDED FOR 2 DAYS FOR WEIGHT ABOVE 255 POUNDS, Disp: 30 tablet, Rfl: 0   metoprolol succinate (TOPROL-XL) 100 MG 24 hr tablet, TAKE 1 TABLET BY MOUTH DAILY ALONG WITH 25 MG TABLET, Disp: 90 tablet, Rfl: 1   metoprolol succinate (TOPROL-XL) 25 MG 24 hr tablet, Take 25 mg (1 tab) along with a 100 mg tab for a total daily dose of 125 mg. (Patient taking differently: Take 25 mg by mouth daily.), Disp: 90 tablet, Rfl: 3  morphine (MS CONTIN) 15 MG 12 hr tablet, Take 1 tablet (15 mg total) by mouth every 12 (twelve) hours., Disp: 60 tablet, Rfl: 0   morphine (MSIR) 15 MG tablet, Take 1 tablet (15 mg total) by mouth 2 (two) times daily., Disp: 60 tablet, Rfl: 0   Multiple Vitamin (MULTIVITAMIN WITH MINERALS) TABS, Take 1 tablet by mouth daily. , Disp: , Rfl:    olmesartan (BENICAR) 40 MG tablet, Take 1 tablet (40 mg total) by mouth daily., Disp: 90 tablet, Rfl: 3   ondansetron (ZOFRAN-ODT) 4 MG disintegrating tablet, Take 1 tablet (4 mg total) by mouth every 8 (eight) hours as needed for nausea or vomiting., Disp: 30 tablet, Rfl: 1   OXYGEN, Inhale 3 L into the lungs See admin instructions. 3 L at bedtime, and 3 L during the day as needed for exertion, Disp: , Rfl:    pantoprazole (PROTONIX) 40 MG tablet, Take 1 tablet (40 mg total) by mouth 2 (two) times daily. take 1 tablet by mouth once daily MINUTES BEFORE 1ST MEAL OF THE DAY, Disp: 120 tablet, Rfl: 3   Potassium Chloride ER 20 MEQ TBCR, Take 1 tablet (20 mEq total) by mouth daily., Disp: 90 tablet, Rfl: 3   predniSONE (DELTASONE) 10 MG tablet, Take 1 tablet (10 mg total) by mouth daily with breakfast., Disp: 90 tablet, Rfl: 1   Semaglutide,0.25 or 0.5MG /DOS, (OZEMPIC, 0.25 OR 0.5  MG/DOSE,) 2 MG/1.5ML SOPN, Inject 0.5 mg into the skin once a week., Disp: 4.5 mL, Rfl: 3      Objective:   There were no vitals filed for this visit.  Estimated body mass index is 39.03 kg/m as calculated from the following:   Height as of 03/25/23: 5\' 4"  (1.626 m).   Weight as of 03/02/23: 227 lb 6.4 oz (103.1 kg).  @WEIGHTCHANGE @  There were no vitals filed for this visit.   Physical Exam   General: No distress. Obese, sounds congested O2 at rest: no Cane present: no Sitting in wheel chair: no Frail: YES Obese: YES Neuro: Alert and Oriented x 3. GCS 15. Speech normal Psych: Pleasant        Assessment:       ICD-10-CM   1. Acute bronchitis, unspecified organism  J20.9     2. ILD (interstitial lung disease) (HCC)  J84.9     3. History of pulmonary embolism  Z86.711          Plan:     Patient Instructions  Acute bronchitis, unspecified organism  Plan  - do home covid test  - Take doxycycline 100mg  po twice daily x 5 days; take after meals and avoid sunlight - Take prednisone 40 mg daily x 2 days, then 30mg  daily x 2 days, then 20mg  daily x 2 days, then 10mg  daily baseline dose to continue  - ER if worse  ILD (interstitial lung disease) (HCC)  - clinically stable  Plan  = monitor purlse ox; if dropping go to ER - monitor off ofev (due to prior hx with diarrhea) - spiro/ldco in next visit  History of pulmonary embolism June 2023 History of Gl bleed summer 2024  Recent July 2024 Lower GI Bleed and resultant anemia Compleed 13 months of eliquids for PE treatment ending August 2024 and stiopoed due to GI bleed  Plan  --No antifibrotic till  GI issues sorted out ad disease progresses further; (Ofev to be listed as allergy) =check cbc and d-dimer a next visit - hold off eliquis for now  Followup  - 3 mnths with Dr. Marchelle Gearing but after spiro/dlco  = 30 min visit; symptoms score at followup; can look at reepeat d-dime and decide on atleast low  dose eliqusi at followup   FOLLOWUP Return in about 10 weeks (around 06/18/2023) for 15 min visit, ILD, after Cleda Daub and DLCO, with Dr Marchelle Gearing, Face to Face Visit.    SIGNATURE    Dr. Kalman Shan, M.D., F.C.C.P,  Pulmonary and Critical Care Medicine Staff Physician, [facility] [facility] Director - Interstitial Lung Disease  Program  Pulmonary Fibrosis [facility] at [facility] [place], [place], [address]  Pager: [phone], If no answer or between  15:00h - 7:00h: call [phone] Telephone: [phone]  4:46 PM 04/09/2023

## 2023-04-09 NOTE — Patient Instructions (Addendum)
Acute bronchitis, unspecified organism  Plan  - do home covid test  - Take doxycycline 100mg  po twice daily x 5 days; take after meals and avoid sunlight - Take prednisone 40 mg daily x 2 days, then 30mg  daily x 2 days, then 20mg  daily x 2 days, then 10mg  daily baseline dose to continue  - ER if worse  ILD (interstitial lung disease) (HCC)  - clinically stable  Plan  = monitor purlse ox; if dropping go to ER - monitor off ofev (due to prior hx with diarrhea) - spiro/ldco in next visit  History of pulmonary embolism June 2023 History of Gl bleed summer 2024  Recent July 2024 Lower GI Bleed and resultant anemia Compleed 13 months of eliquids for PE treatment ending August 2024 and stiopoed due to GI bleed  Plan  --No antifibrotic till  GI issues sorted out ad disease progresses further; (Ofev to be listed as allergy) =check cbc and d-dimer a next visit - hold off eliquis for now   Followup  - 3 mnths with Dr. Marchelle Gearing but after spiro/dlco  = 30 min visit; symptoms score at followup; can look at reepeat d-dime and decide on atleast low dose eliqusi at followup

## 2023-04-09 NOTE — Addendum Note (Signed)
Addended by: Delrae Rend on: 04/09/2023 05:10 PM   Modules accepted: Orders

## 2023-04-23 ENCOUNTER — Telehealth: Payer: Self-pay | Admitting: Internal Medicine

## 2023-04-23 NOTE — Telephone Encounter (Signed)
Patient scheduled for PFT 04/24/2023. Having diarrhea and vomiting today. Would like to know if she should come for the PFT 04/24/2023. Patient phone number is [phone].

## 2023-04-24 NOTE — Telephone Encounter (Signed)
Called patient.  Patient states she feel fine today.  No vomiting or diarrhea.  No fever or chills.  Patient does not feel this was viral.  Patient would like to keep PFT appointment for today.  Will keep PFT appointment for today at 1:30 pm.

## 2023-04-29 DIAGNOSIS — E119 Type 2 diabetes mellitus without complications: Secondary | ICD-10-CM | POA: Diagnosis not present

## 2023-04-29 DIAGNOSIS — Z79899 Other long term (current) drug therapy: Secondary | ICD-10-CM | POA: Diagnosis not present

## 2023-04-29 DIAGNOSIS — H524 Presbyopia: Secondary | ICD-10-CM | POA: Diagnosis not present

## 2023-04-29 DIAGNOSIS — H2512 Age-related nuclear cataract, left eye: Secondary | ICD-10-CM | POA: Diagnosis not present

## 2023-04-29 DIAGNOSIS — Z961 Presence of intraocular lens: Secondary | ICD-10-CM | POA: Diagnosis not present

## 2023-04-29 LAB — HM DIABETES EYE EXAM

## 2023-04-30 DIAGNOSIS — Z7960 Long term (current) use of unspecified immunomodulators and immunosuppressants: Secondary | ICD-10-CM | POA: Diagnosis not present

## 2023-04-30 DIAGNOSIS — M0579 Rheumatoid arthritis with rheumatoid factor of multiple sites without organ or systems involvement: Secondary | ICD-10-CM | POA: Diagnosis not present

## 2023-04-30 DIAGNOSIS — Z79899 Other long term (current) drug therapy: Secondary | ICD-10-CM | POA: Diagnosis not present

## 2023-05-01 ENCOUNTER — Ambulatory Visit: Payer: Medicare Other | Admitting: Internal Medicine

## 2023-05-01 DIAGNOSIS — J209 Acute bronchitis, unspecified: Secondary | ICD-10-CM | POA: Diagnosis not present

## 2023-05-01 LAB — PULMONARY FUNCTION TEST
DL/VA % pred: 93 %
DL/VA: 3.81 ml/min/mmHg/L
DLCO cor % pred: 71 %
DLCO cor: 13.36 ml/min/mmHg
DLCO unc % pred: 71 %
DLCO unc: 13.36 ml/min/mmHg
FEF 25-75 Pre: 2.49 L/s
FEF2575-%Pred-Pre: 183 %
FEV1-%Pred-Pre: 75 %
FEV1-Pre: 1.44 L
FEV1FVC-%Pred-Pre: 115 %
FEV6-%Pred-Pre: 69 %
FEV6-Pre: 1.7 L
FEV6FVC-%Pred-Pre: 105 %
FVC-%Pred-Pre: 66 %
FVC-Pre: 1.7 L
Pre FEV1/FVC ratio: 85 %
Pre FEV6/FVC Ratio: 100 %

## 2023-05-01 NOTE — Progress Notes (Signed)
Spirometry/DLCO performed today. 

## 2023-05-01 NOTE — Patient Instructions (Signed)
Spirometry/DLCO performed today. 

## 2023-05-15 ENCOUNTER — Encounter: Payer: Self-pay | Admitting: Cardiovascular Disease

## 2023-05-15 ENCOUNTER — Ambulatory Visit: Payer: Medicare Other | Attending: Cardiovascular Disease | Admitting: Cardiovascular Disease

## 2023-05-15 VITALS — BP 134/78 | HR 111 | Ht 64.0 in | Wt 240.2 lb

## 2023-05-15 DIAGNOSIS — I2510 Atherosclerotic heart disease of native coronary artery without angina pectoris: Secondary | ICD-10-CM | POA: Insufficient documentation

## 2023-05-15 DIAGNOSIS — E785 Hyperlipidemia, unspecified: Secondary | ICD-10-CM | POA: Diagnosis not present

## 2023-05-15 DIAGNOSIS — I5032 Chronic diastolic (congestive) heart failure: Secondary | ICD-10-CM | POA: Insufficient documentation

## 2023-05-15 DIAGNOSIS — I10 Essential (primary) hypertension: Secondary | ICD-10-CM | POA: Diagnosis not present

## 2023-05-15 DIAGNOSIS — R600 Localized edema: Secondary | ICD-10-CM | POA: Diagnosis not present

## 2023-05-15 MED ORDER — FUROSEMIDE 80 MG PO TABS: 80.0000 mg | ORAL_TABLET | Freq: Every day | ORAL | 3 refills | Status: DC

## 2023-05-15 NOTE — Progress Notes (Signed)
05/15/2023 [name]   29-Jan-1941  [id number]  Primary Physician [name], MD Primary Cardiologist: Runell Gess MD Nicholes Calamity, [place]  HPI:  [name] is a 82 y.o.  moderately overweight married African American female mother of 5 living children, grandmother of a 12 grandchildren referred by Dr. Marchelle Gearing for cardiovascular evaluation because of progressive dyspnea on exertion and chest pain. I last saw her in the  office 01/28/2022.[name]  She is accompanied by her daughter Azucena Kuba .  Her cardiovascular risk factor are  notable for a strong family history of heart disease with her mother and 4 brothers old with an ischemic heart disease. She has never had a heart attack or stroke. She does have interstitial lung disease as well as rheumatoid arthritis. She is on home oxygen. She also has a brain tumor/meningioma 2 with gamma knife therapy  She's had progressive disabling dyspnea as well as daily chest pain. She underwent cardiac catheterization 10/31/14 by Dr. Eldridge Dace revealing normal coronary arteries and normal LV function. She did have mild pulmonary hypertension. 2-D echo showed normal LV function with grade 1 diastolic dysfunction and mild pulmonary hypertension. Her symptoms really have not changed on by mouth diuretics. She was referred to [facility] for further evaluation and treatment of interstitial lung disease. Since beginning an antibiotic and an inhaled bronchodilator her shortness of breath is somewhat improved.   She had an episode of syncope prior to seeing Corine Shelter thought to be related to overmedication and dehydration from poor p.o. intake.  Her meds were adjusted.  She was also hospitalized last month with sepsis related to a tooth infection with and was in the hospital for 10 days.  She had fluid overload and has since diuresed.  She gets occasional atypical chest pain.  Her blood pressure is mildly elevated when she takes it  at home.  She is on furosemide 60 mg a day.   She had been seeing one of our our Pharm.D. for medication titration for hypertension which is under better control.    She had noticed increasing weight gain lower extremity edema as well as some atypical chest pain and left flank pain.  She did see her nephrologist who put her on antibiotic for presumed UTI and increased her diuretic from furosemide 60 to 80 mg a day.  Her symptoms sound like she has an "upper urinary tract infection" which may have exacerbated her diastolic heart failure.  Her nephrologist is following her volume status and renal function closely.   I did a right heart cath at the request of Dr. Marchelle Gearing on 08/12/2021 which revealed a pulmonary artery pressure of 43/19 with a mean of 30, right atrial pressure of 20/18 and a wedge pressure of 22 mean.  Her cardiac output was 5 L/min.  Her most recent 2D echo performed 11/06/2021 revealed normal LV systolic function with out significant pulmonary hypertension.  She did see [name] in the office on 10/28/2021 complaining of increasing shortness of breath.  As result a chest CTA was performed on 10/30/2021 revealing multiple subsegmental pulmonary emboli in the right upper and middle lobe.  She has subsequently been put on Eliquis.  Venous Dopplers done a month before showed no evidence of DVT.   Since I saw her in the office 15 months ago she has gained approxi-20 pounds probably fluid weight.  She was admitted in August with a GI bleed.  Her Eliquis was discontinued.  She admits to taking her diuretic sporadically.  She denies chest pain.   Current Meds  Medication Sig   acetaminophen (TYLENOL) 500 MG tablet Take 1,000 mg by mouth as needed for moderate pain.   albuterol (PROVENTIL) (2.5 MG/3ML) 0.083% nebulizer solution Take 3 mLs (2.5 mg total) by nebulization every 6 (six) hours as needed for wheezing. (Patient taking differently: Take 2.5 mg by nebulization 2 (two) times daily as needed  for wheezing or shortness of breath.)   albuterol (VENTOLIN HFA) 108 (90 Base) MCG/ACT inhaler Inhale 2 puffs into the lungs as needed for wheezing or shortness of breath.   allopurinol (ZYLOPRIM) 300 MG tablet Take 1 tablet (300 mg total) by mouth daily.   Artificial Tear Ointment (DRY EYES OP) Place 2 drops into both eyes as needed (dry eyes).   atorvastatin (LIPITOR) 10 MG tablet Take 1 tablet (10 mg total) by mouth daily. (Patient taking differently: Take 10 mg by mouth at bedtime.)   azithromycin (ZITHROMAX) 250 MG tablet Take 1 tablet (250 mg total) by mouth every Monday, Wednesday, and Friday. Take every Monday, Wednesday, Friday.   Calcium Carb-Cholecalciferol (CALCIUM + D3 PO) Take 2 tablets by mouth daily.   Camphor-Menthol-Methyl Sal (SALONPAS EX) Apply 1 patch topically as needed (shoulder and leg pain).   Cholecalciferol (VITAMIN D3) 50 MCG (2000 UT) capsule Take 2,000 Units by mouth daily.   colchicine 0.6 MG tablet Take 1 tablet (0.6 mg total) by mouth daily as needed (gout flares).   cyclobenzaprine (FLEXERIL) 5 MG tablet Take 1 tablet (5 mg total) by mouth at bedtime as needed for muscle spasms.   diclofenac Sodium (VOLTAREN) 1 % GEL Apply 2 g topically 3 (three) times daily. Apply to both hands and knees (Patient taking differently: Apply 1 Application topically in the morning, at noon, and at bedtime.)   donepezil (ARICEPT) 10 MG tablet Take 1 tablet (10 mg total) by mouth at bedtime.   doxycycline (VIBRA-TABS) 100 MG tablet Take 1 tablet (100 mg total) by mouth 2 (two) times daily.   DULoxetine (CYMBALTA) 60 MG capsule Take 1 capsule (60 mg total) by mouth daily.   etanercept (ENBREL SURECLICK) 50 MG/ML injection Inject 50 mg into the skin once a week.   fluticasone (FLONASE) 50 MCG/ACT nasal spray Place 1 spray into both nostrils daily. (Patient taking differently: Place 2 sprays into both nostrils daily.)   furosemide (LASIX) 80 MG tablet TAKE 1 TABLET(80 MG) BY MOUTH DAILY    gabapentin (NEURONTIN) 600 MG tablet Take 2 tablets (1,200 mg total) by mouth 3 (three) times daily.   guaiFENesin (MUCINEX) 600 MG 12 hr tablet Take 2 tablets (1,200 mg total) by mouth 2 (two) times daily.   hydrALAZINE (APRESOLINE) 25 MG tablet TAKE 1 TABLET(25 MG) BY MOUTH TWICE DAILY   hydroxychloroquine (PLAQUENIL) 200 MG tablet Take 1 tablet (200 mg total) by mouth daily.   leflunomide (ARAVA) 20 MG tablet Take 1 tablet (20 mg total) by mouth daily.   levothyroxine (SYNTHROID) 50 MCG tablet Take 1 tablet (50 mcg total) by mouth daily.   lidocaine (LIDODERM) 5 % Place 1 patch onto the skin daily. Remove & Discard patch within 12 hours or as directed by MD   linaclotide (LINZESS) 72 MCG capsule Take 1 capsule (72 mcg total) by mouth daily before breakfast.   Menthol, Topical Analgesic, (BIOFREEZE ROLL-ON EX) Apply 1 Application topically as needed (pain).   metolazone (ZAROXOLYN) 2.5 MG tablet TAKE 1 TABLET BY MOUTH DAILY AS NEEDED  FOR 2 DAYS FOR WEIGHT ABOVE 255 POUNDS   metoprolol succinate (TOPROL-XL) 100 MG 24 hr tablet TAKE 1 TABLET BY MOUTH DAILY ALONG WITH 25 MG TABLET   metoprolol succinate (TOPROL-XL) 25 MG 24 hr tablet Take 25 mg (1 tab) along with a 100 mg tab for a total daily dose of 125 mg. (Patient taking differently: Take 25 mg by mouth daily.)   morphine (MSIR) 15 MG tablet Take 1 tablet (15 mg total) by mouth 2 (two) times daily.   Multiple Vitamin (MULTIVITAMIN WITH MINERALS) TABS Take 1 tablet by mouth daily.    olmesartan (BENICAR) 40 MG tablet Take 1 tablet (40 mg total) by mouth daily.   ondansetron (ZOFRAN-ODT) 4 MG disintegrating tablet Take 1 tablet (4 mg total) by mouth every 8 (eight) hours as needed for nausea or vomiting.   OXYGEN Inhale 3 L into the lungs See admin instructions. 3 L at bedtime, and 3 L during the day as needed for exertion   pantoprazole (PROTONIX) 40 MG tablet Take 1 tablet (40 mg total) by mouth 2 (two) times daily. take 1 tablet by mouth once  daily MINUTES BEFORE 1ST MEAL OF THE DAY   Potassium Chloride ER 20 MEQ TBCR Take 1 tablet (20 mEq total) by mouth daily.   predniSONE (DELTASONE) 10 MG tablet Take 1 tablet (10 mg total) by mouth daily with breakfast.   Semaglutide,0.25 or 0.5MG /DOS, (OZEMPIC, 0.25 OR 0.5 MG/DOSE,) 2 MG/1.5ML SOPN Inject 0.5 mg into the skin once a week.     Allergies  Allergen Reactions   Codeine Swelling and Other (See Comments)    Facial swelling, Chest pain, and swelling in legs    Infliximab Anaphylaxis    (REMICADE) "sent me into shock"    Lisinopril Swelling and Rash    Face and neck swelling    Ofev [Nintedanib] Diarrhea    Social History   Socioeconomic History   Marital status: Married    Spouse name: Not on file   Number of children: 6   Years of education: college   Highest education level: Bachelor's degree (e.g., BA, AB, BS)  Occupational History   Occupation: retired Engineer, civil (consulting)  Tobacco Use   Smoking status: Never   Smokeless tobacco: Never  Vaping Use   Vaping status: Never Used  Substance and Sexual Activity   Alcohol use: No   Drug use: No   Sexual activity: Not Currently  Other Topics Concern   Not on file  Social History Narrative   Patient consumes 2-3 cups coffee per day.   Social Drivers of Corporate investment banker Strain: Low Risk  (12/24/2022)   Overall Financial Resource Strain (CARDIA)    Difficulty of Paying Living Expenses: Not hard at all  Food Insecurity: No Food Insecurity (12/24/2022)   Hunger Vital Sign    Worried About Running Out of Food in the Last Year: Never true    Ran Out of Food in the Last Year: Never true  Transportation Needs: No Transportation Needs (12/24/2022)   PRAPARE - Administrator, Civil Service (Medical): No    Lack of Transportation (Non-Medical): No  Physical Activity: Unknown (12/24/2022)   Exercise Vital Sign    Days of Exercise per Week: Patient declined    Minutes of Exercise per Session: 20 min  Recent  Concern: Physical Activity - Insufficiently Active (10/27/2022)   Exercise Vital Sign    Days of Exercise per Week: 7 days    Minutes of  Exercise per Session: 20 min  Stress: No Stress Concern Present (10/27/2022)   [employer] of Occupational Health - Occupational Stress Questionnaire    Feeling of Stress : Not at all  Social Connections: Socially Integrated (12/24/2022)   Social Connection and Isolation Panel [NHANES]    Frequency of Communication with Friends and Family: More than three times a week    Frequency of Social Gatherings with Friends and Family: Three times a week    Attends Religious Services: More than 4 times per year    Active Member of Clubs or Organizations: Yes    Attends Banker Meetings: 1 to 4 times per year    Marital Status: Married  Catering manager Violence: Not At Risk (12/16/2022)   Humiliation, Afraid, Rape, and Kick questionnaire    Fear of Current or Ex-Partner: No    Emotionally Abused: No    Physically Abused: No    Sexually Abused: No     Review of Systems: General: negative for chills, fever, night sweats or weight changes.  Cardiovascular: negative for chest pain, dyspnea on exertion, edema, orthopnea, palpitations, paroxysmal nocturnal dyspnea or shortness of breath Dermatological: negative for rash Respiratory: negative for cough or wheezing Urologic: negative for hematuria Abdominal: negative for nausea, vomiting, diarrhea, bright red blood per rectum, melena, or hematemesis Neurologic: negative for visual changes, syncope, or dizziness All other systems reviewed and are otherwise negative except as noted above.    Blood pressure 134/78, pulse (!) 111, height 5\' 4"  (1.626 m), weight 240 lb 3.2 oz (109 kg), SpO2 94%.  General appearance: alert and no distress Neck: no adenopathy, no carotid bruit, no JVD, supple, symmetrical, trachea midline, and thyroid not enlarged, symmetric, no tenderness/mass/nodules Lungs: clear to  auscultation bilaterally Heart: regular rate and rhythm, S1, S2 normal, no murmur, click, rub or gallop Extremities: 3+ pitting edema Pulses: 2+ and symmetric Skin: Skin color, texture, turgor normal. No rashes or lesions Neurologic: Grossly normal  EKG EKG Interpretation Date/Time:  Friday May 15 2023 10:50:54 EST Ventricular Rate:  111 PR Interval:  150 QRS Duration:  80 QT Interval:  328 QTC Calculation: 446 R Axis:   -44  Text Interpretation: Sinus tachycardia Left axis deviation When compared with ECG of 02-Feb-2022 18:33, PREVIOUS ECG IS PRESENT Confirmed by Nanetta Batty [id number]) on 05/15/2023 11:09:01 AM    ASSESSMENT AND PLAN:   Dyslipidemia History of dyslipidemia on statin therapy lipid profile performed 09/29/2022 revealing total cholesterol 146, LDL 76 and HDL 42.  Essential hypertension History of essential hypertension with blood pressure measured today at 134/78.  She is on hydralazine, Benicar and metoprolol.  Bilateral edema of lower extremity Chronic bilateral lower extremity edema on furosemide.  She says that she has not been taking her diuretic regularly.  She does have 2-3+ pitting edema.  We talked about the importance of medication compliance.     Runell Gess MD FACP,FACC,FAHA, [facility] 05/15/2023 11:20 AM

## 2023-05-15 NOTE — Patient Instructions (Signed)
Medication Instructions:  Your physician recommends that you continue on your current medications as directed. Please refer to the Current Medication list given to you today.  *If you need a refill on your cardiac medications before your next appointment, please call your pharmacy*   Follow-Up: At [facility], you and your health needs are our priority.  As part of our continuing mission to provide you with exceptional heart care, we have created designated Provider Care Teams.  These Care Teams include your primary Cardiologist (physician) and Advanced Practice Providers (APPs -  Physician Assistants and Nurse Practitioners) who all work together to provide you with the care you need, when you need it.  We recommend signing up for the patient portal called "MyChart".  Sign up information is provided on this After Visit Summary.  MyChart is used to connect with patients for Virtual Visits (Telemedicine).  Patients are able to view lab/test results, encounter notes, upcoming appointments, etc.  Non-urgent messages can be sent to your provider as well.   To learn more about what you can do with MyChart, go to [email or web address].    Your next appointment:   3 month(s)  Provider:   Bernadene Person, NP       Then, Nanetta Batty, MD will plan to see you again in 12 month(s).

## 2023-05-15 NOTE — Assessment & Plan Note (Signed)
History of essential hypertension with blood pressure measured today at 134/78.  She is on hydralazine, Benicar and metoprolol.

## 2023-05-15 NOTE — Assessment & Plan Note (Signed)
Chronic bilateral lower extremity edema on furosemide.  She says that she has not been taking her diuretic regularly.  She does have 2-3+ pitting edema.  We talked about the importance of medication compliance.

## 2023-05-15 NOTE — Assessment & Plan Note (Signed)
History of dyslipidemia on statin therapy lipid profile performed 09/29/2022 revealing total cholesterol 146, LDL 76 and HDL 42.

## 2023-05-22 NOTE — Progress Notes (Signed)
 Subjective:    Patient ID: [name], female    DOB: 12/29/1940, 83 y.o.   MRN: [id number]  HPI: [name] is a 83 y.o. female who returns for follow up appointment for chronic pain and medication refill. She states her pain is located in her neck radiating into her bilateral shoulders, mid- lower back pain radiating into her left hip and bilateral knee pain.   [name] reports she was trying to scheduled an appointment with her Gastroenterologist she states they have been following her for a chronic issue, she reports she is moving her bowels, she will call to schedule an appointment with Gastroenterology, she verbalizes understanding. [name] son in the room.   She also reports generalized joint pain. She rates her pain 5. current exercise regime is walking and performing stretching exercises.  [name] Morphine  equivalent is 60.00 MME.   Oral Swab was Performed today.    Pain Inventory Average Pain 7 Pain Right Now 5 My pain is intermittent, constant, sharp, and burning  In the last 24 hours, has pain interfered with the following? General activity 8 Relation with others 8 Enjoyment of life 8 What TIME of day is your pain at its worst? morning , daytime, evening, and night Sleep (in general) Good  Pain is worse with: walking, bending, sitting, standing, and some activites Pain improves with: rest and medication Relief from Meds: 5  Family History  Problem Relation Age of Onset   Diabetes Mother    Heart attack Mother    Hypertension Father    Lung cancer Father    Diabetes Sister    Breast cancer Sister    Breast cancer Sister    Diabetes Brother    Hypertension Brother    Heart disease Brother    Heart attack Brother    Kidney cancer Brother    Gout Brother    Kidney failure Brother        x 5   Esophageal cancer Brother    Stomach cancer Brother    Prostate cancer Brother    Uterine cancer Daughter        with mets   Fibroids Daughter     Hypertension Daughter    Hypertension Daughter    Hypertension Son    Heart murmur Son    Rheum arthritis Maternal Uncle    Alzheimer's disease Neg Hx    Dementia Neg Hx    Social History   Socioeconomic History   Marital status: Married    Spouse name: Not on file   Number of children: 6   Years of education: college   Highest education level: Bachelor's degree (e.g., BA, AB, BS)  Occupational History   Occupation: retired Engineer, Civil (consulting)  Tobacco Use   Smoking status: Never   Smokeless tobacco: Never  Vaping Use   Vaping status: Never Used  Substance and Sexual Activity   Alcohol  use: No   Drug use: No   Sexual activity: Not Currently  Other Topics Concern   Not on file  Social History Narrative   Patient consumes 2-3 cups coffee per day.   Social Drivers of Corporate Investment Banker Strain: Low Risk  (12/24/2022)   Overall Financial Resource Strain (CARDIA)    Difficulty of Paying Living Expenses: Not hard at all  Food Insecurity: No Food Insecurity (12/24/2022)   Hunger Vital Sign    Worried About Running Out of Food in the Last Year: Never true    Ran  Out of Food in the Last Year: Never true  Transportation Needs: No Transportation Needs (12/24/2022)   PRAPARE - Administrator, Civil Service (Medical): No    Lack of Transportation (Non-Medical): No  Physical Activity: Unknown (12/24/2022)   Exercise Vital Sign    Days of Exercise per Week: Patient declined    Minutes of Exercise per Session: 20 min  Recent Concern: Physical Activity - Insufficiently Active (10/27/2022)   Exercise Vital Sign    Days of Exercise per Week: 7 days    Minutes of Exercise per Session: 20 min  Stress: No Stress Concern Present (10/27/2022)   [employer] of Occupational Health - Occupational Stress Questionnaire    Feeling of Stress : Not at all  Social Connections: Socially Integrated (12/24/2022)   Social Connection and Isolation Panel [NHANES]    Frequency of Communication  with Friends and Family: More than three times a week    Frequency of Social Gatherings with Friends and Family: Three times a week    Attends Religious Services: More than 4 times per year    Active Member of Clubs or Organizations: Yes    Attends Banker Meetings: 1 to 4 times per year    Marital Status: Married   Past Surgical History:  Procedure Laterality Date   BRAIN SURGERY     Gamma knife 10/13. Needs repeat spring  '14   CARDIAC CATHETERIZATION N/A 10/31/2014   Procedure: Right/Left Heart Cath and Coronary Angiography;  Surgeon: Candyce GORMAN Reek, MD;  Location: [facility] INVASIVE CV LAB;  Service: Cardiovascular;  Laterality: N/A;   COLONOSCOPY WITH PROPOFOL  N/A 04/14/2022   Procedure: COLONOSCOPY WITH PROPOFOL ;  Surgeon: Albertus Gordy HERO, MD;  Location: [facility] ENDOSCOPY;  Service: Gastroenterology;  Laterality: N/A;   CYST EXCISION     2 on face   CYST REMOVAL NECK     ESOPHAGOGASTRODUODENOSCOPY (EGD) WITH PROPOFOL  N/A 09/14/2014   Procedure: ESOPHAGOGASTRODUODENOSCOPY (EGD) WITH PROPOFOL ;  Surgeon: Lamar JONETTA Aho, MD;  Location: [facility] ENDOSCOPY;  Service: Endoscopy;  Laterality: N/A;   INCISION AND DRAINAGE HIP Left 01/16/2017   Procedure: IRRIGATION AND DEBRIDEMENT LEFT HIP;  Surgeon: Vernetta Lonni GRADE, MD;  Location: [facility] ORS;  Service: Orthopedics;  Laterality: Left;   INTRAMEDULLARY (IM) NAIL INTERTROCHANTERIC Left 11/29/2016   Procedure: INTRAMEDULLARY (IM) NAIL INTERTROCHANTRIC;  Surgeon: Vernetta Lonni GRADE, MD;  Location: [facility] OR;  Service: Orthopedics;  Laterality: Left;   OVARY SURGERY     RIGHT HEART CATH N/A 08/12/2021   Procedure: RIGHT HEART CATH;  Surgeon: Court Dorn PARAS, MD;  Location: [facility] INVASIVE CV LAB;  Service: Cardiovascular;  Laterality: N/A;   SHOULDER SURGERY Left    TONSILLECTOMY  age 83   VAGINAL HYSTERECTOMY     VIDEO BRONCHOSCOPY Bilateral 05/31/2013   Procedure: VIDEO BRONCHOSCOPY WITHOUT FLUORO;  Surgeon: Dorethia Cave, MD;   Location: [facility] ENDOSCOPY;  Service: Cardiopulmonary;  Laterality: Bilateral;   video bronscoscopy  05/19/1998   lung   Past Surgical History:  Procedure Laterality Date   BRAIN SURGERY     Gamma knife 10/13. Needs repeat spring  '14   CARDIAC CATHETERIZATION N/A 10/31/2014   Procedure: Right/Left Heart Cath and Coronary Angiography;  Surgeon: Candyce GORMAN Reek, MD;  Location: [facility] INVASIVE CV LAB;  Service: Cardiovascular;  Laterality: N/A;   COLONOSCOPY WITH PROPOFOL  N/A 04/14/2022   Procedure: COLONOSCOPY WITH PROPOFOL ;  Surgeon: Albertus Gordy HERO, MD;  Location: [facility] ENDOSCOPY;  Service: Gastroenterology;  Laterality: N/A;   CYST EXCISION  2 on face   CYST REMOVAL NECK     ESOPHAGOGASTRODUODENOSCOPY (EGD) WITH PROPOFOL  N/A 09/14/2014   Procedure: ESOPHAGOGASTRODUODENOSCOPY (EGD) WITH PROPOFOL ;  Surgeon: Lamar JONETTA Aho, MD;  Location: [facility] ENDOSCOPY;  Service: Endoscopy;  Laterality: N/A;   INCISION AND DRAINAGE HIP Left 01/16/2017   Procedure: IRRIGATION AND DEBRIDEMENT LEFT HIP;  Surgeon: Vernetta Lonni GRADE, MD;  Location: [facility] ORS;  Service: Orthopedics;  Laterality: Left;   INTRAMEDULLARY (IM) NAIL INTERTROCHANTERIC Left 11/29/2016   Procedure: INTRAMEDULLARY (IM) NAIL INTERTROCHANTRIC;  Surgeon: Vernetta Lonni GRADE, MD;  Location: [facility] OR;  Service: Orthopedics;  Laterality: Left;   OVARY SURGERY     RIGHT HEART CATH N/A 08/12/2021   Procedure: RIGHT HEART CATH;  Surgeon: Court Dorn PARAS, MD;  Location: [facility] INVASIVE CV LAB;  Service: Cardiovascular;  Laterality: N/A;   SHOULDER SURGERY Left    TONSILLECTOMY  age 47   VAGINAL HYSTERECTOMY     VIDEO BRONCHOSCOPY Bilateral 05/31/2013   Procedure: VIDEO BRONCHOSCOPY WITHOUT FLUORO;  Surgeon: Dorethia Cave, MD;  Location: [facility] ENDOSCOPY;  Service: Cardiopulmonary;  Laterality: Bilateral;   video bronscoscopy  05/19/1998   lung   Past Medical History:  Diagnosis Date   Acute on chronic diastolic (congestive) heart failure (HCC)     Anemia    iron  deficiency anemia - secondary to blood loss ( chronic)    Anxiety    Arthritis    endstage changes bilateral knees/bilateral ankles.    Asthma    Carotid artery occlusion    Chronic fatigue    Chronic kidney disease    Closed left hip fracture (HCC)    Clotting disorder (HCC)    pt denies this   Contusion of left knee    due to fall 1/14.   COPD (chronic obstructive pulmonary disease) (HCC)    pulmonary fibrosis   Depression, reactive    Diabetes mellitus    type II    Diastolic dysfunction    Difficulty in walking    Family history of heart disease    Generalized muscle weakness    Gout    High cholesterol    History of falling    Hypertension    Hypothyroidism    Interstitial lung disease (HCC)    Meningioma of left sphenoid wing involving cavernous sinus (HCC) 02/17/2012   Continue diplopia, left eye pain and left headaches.     Morbid obesity (HCC)    Neuromuscular disorder (HCC)    diabetic neuropathy    Normal coronary arteries    cardiac catheterization performed  10/31/14   Pneumonia    RA (rheumatoid arthritis) (HCC)    has been off methotreaxte since 10/13.   Spinal stenosis of lumbar region    Thyroid  disease    Unspecified lack of coordination    URI (upper respiratory infection)    There were no vitals taken for this visit.  Opioid Risk Score:   Fall Risk Score:  `1  Depression screen [facility] 2/9     03/25/2023   11:21 AM 03/02/2023    1:23 PM 01/28/2023   11:23 AM 11/27/2022   11:17 AM 10/27/2022    1:46 PM 09/22/2022   11:27 AM 09/03/2022   10:10 AM  Depression screen PHQ 2/9  Decreased Interest 0 0 0 1 0 0 0  Down, Depressed, Hopeless 0 0 0 1 0 0 1  PHQ - 2 Score 0 0 0 2 0 0 1  Altered sleeping  0   0  1  Tired, decreased energy  0   0  1  Change in appetite  0   0  0  Feeling bad or failure about yourself   0   0  1  Trouble concentrating  0   0  0  Moving slowly or fidgety/restless  0   0  1  Suicidal thoughts  0   0  0   PHQ-9 Score  0   0  5    Review of Systems  Musculoskeletal:  Positive for back pain and gait problem.       Pain in both shoulders, pain on right side, left knee pain  All other systems reviewed and are negative.      Objective:   Physical Exam Vitals and nursing note reviewed.  Constitutional:      Appearance: Normal appearance.  Neck:     Comments: Cervical Paraspinal Tenderness: C-5-C-6  Cardiovascular:     Rate and Rhythm: Normal rate and regular rhythm.     Pulses: Normal pulses.     Heart sounds: Normal heart sounds.  Pulmonary:     Effort: Pulmonary effort is normal.     Breath sounds: Normal breath sounds.     Comments: Continuous Oxygen  3 Liters Nasal Cannula  Abdominal:     General: Bowel sounds are normal.     Palpations: Abdomen is soft.  Musculoskeletal:     Right lower leg: Edema present.     Left lower leg: Edema present.     Comments: Normal Muscle Bulk and Muscle Testing Reveals:  Upper Extremities: Decreased ROM 90 Degrees  and Muscle Strength  5/5 Bilateral AC Joint Tenderness Thoracic and  Lumbar Hypersensitivity Left Greater Trochanter Tenderness Lower Extremities: Decreased ROM and Muscle Strength 5/5 Left Lower Extremity Flexion Produces Pain into her Bilateral Lower Extremities  Anasarca: Lower Extremities Arrived in wheelchair    Skin:    General: Skin is warm and dry.  Neurological:     Mental Status: She is alert and oriented to person, place, and time.  Psychiatric:        Mood and Affect: Mood normal.        Behavior: Behavior normal.         Assessment & Plan:  1. Lumbar spinal stenosis with neurogenic claudication. Associated facet arthropathy/ Lumbar Radiculitis: Continue current medication regimen with Gabapentin  and HEP as tolerated. 05/25/2023 Refilled: MS Contin  15 mg one tablet every 12 hours #60 and  MSIR 15 one tablet twice a day as needed  For break through pain ( 6 am and 1:00 pm)  #60.since adverse effect with insomnia  with Nucynta . Nucynta  discontinued due to insomnia.  We will continue the opioid monitoring program, this consists of regular clinic visits, examinations, urine drug screen, pill counts as well as use of [place]  Controlled Substance Reporting system. A 12 month History has been reviewed on the   Controlled Substance Reporting System 05/25/2023 2. Chronic Bilateral Thoracic Back Pain: Continue HEP as Tolerated. Continue to Monitor. 05/25/2023 3 Depression: Continue current medication Regime: Cymbalta  . Continue to monitor.05/25/2023 4. Diabetes mellitus type 2 with polyneuropathy: Continue current medication regime: Gabapentin . 05/25/2023 5. Rheumatoid arthritis and osteoarthritis. Continue current medication Regime.  Voltaren  Gel. Rheumatology Following. 05/25/2023 6. Interstitial lung disease: Pulmonology Following. 05/25/2023. 7. Bilateral Osteoarthritis Knee's:  Continue current medication regimen. Voltaren  Gel. 05/25/2023. 8. Muscle Spasm: Continue current medication regime : Flexeril . Continue to monitor.05/25/2023 9. Left  Greater Trochanteric Tenderness: Continue current treatment with Ice/Heat Therapy.  05/25/2023. 10. Polyarthralgia: Rheumatology Following: Continue to monitor.05/25/2023 11. Morbid Obesitity: Continue Healthy Diet Regime and HEP. Continue to monitor.  05/25/2023 12. Cervicalgia/Cervical Radiculitis: Continue to Alternate with Heat and Ice Therapy. Continue to monitor. 05/25/2023  13. Chronic Bilateral Shoulder Pain: Continue HEP as Tolerated. Continue current medication regimen. Continue to monitor. 05/25/2023     F/U in 2 months

## 2023-05-25 ENCOUNTER — Encounter: Payer: Medicare Other | Attending: Physical Medicine & Rehabilitation | Admitting: Registered Nurse

## 2023-05-25 ENCOUNTER — Encounter: Payer: Self-pay | Admitting: Registered Nurse

## 2023-05-25 VITALS — BP 128/73 | HR 100 | Ht 64.0 in | Wt 233.0 lb

## 2023-05-25 DIAGNOSIS — Z79891 Long term (current) use of opiate analgesic: Secondary | ICD-10-CM | POA: Diagnosis not present

## 2023-05-25 DIAGNOSIS — M25512 Pain in left shoulder: Secondary | ICD-10-CM | POA: Diagnosis not present

## 2023-05-25 DIAGNOSIS — M2550 Pain in unspecified joint: Secondary | ICD-10-CM

## 2023-05-25 DIAGNOSIS — M5412 Radiculopathy, cervical region: Secondary | ICD-10-CM | POA: Diagnosis not present

## 2023-05-25 DIAGNOSIS — G894 Chronic pain syndrome: Secondary | ICD-10-CM

## 2023-05-25 DIAGNOSIS — G8929 Other chronic pain: Secondary | ICD-10-CM | POA: Diagnosis not present

## 2023-05-25 DIAGNOSIS — M545 Low back pain, unspecified: Secondary | ICD-10-CM | POA: Diagnosis not present

## 2023-05-25 DIAGNOSIS — M170 Bilateral primary osteoarthritis of knee: Secondary | ICD-10-CM | POA: Diagnosis not present

## 2023-05-25 DIAGNOSIS — Z5181 Encounter for therapeutic drug level monitoring: Secondary | ICD-10-CM

## 2023-05-25 DIAGNOSIS — M5416 Radiculopathy, lumbar region: Secondary | ICD-10-CM

## 2023-05-25 DIAGNOSIS — M25511 Pain in right shoulder: Secondary | ICD-10-CM | POA: Diagnosis not present

## 2023-05-25 DIAGNOSIS — M7062 Trochanteric bursitis, left hip: Secondary | ICD-10-CM

## 2023-05-25 DIAGNOSIS — M542 Cervicalgia: Secondary | ICD-10-CM

## 2023-05-25 MED ORDER — MORPHINE SULFATE 15 MG PO TABS: 15.0000 mg | ORAL_TABLET | Freq: Two times a day (BID) | ORAL | 0 refills | Status: DC

## 2023-05-25 MED ORDER — MORPHINE SULFATE ER 15 MG PO TBCR: 15.0000 mg | EXTENDED_RELEASE_TABLET | Freq: Two times a day (BID) | ORAL | 0 refills | Status: DC

## 2023-05-25 MED ORDER — GABAPENTIN 600 MG PO TABS: 1200.0000 mg | ORAL_TABLET | Freq: Three times a day (TID) | ORAL | 1 refills | Status: DC

## 2023-05-30 LAB — DRUG TOX ALC METAB W/CON, ORAL FLD: Alcohol Metabolite: NEGATIVE ng/mL (ref <25)

## 2023-05-30 LAB — DRUG TOX MONITOR 1 W/CONF, ORAL FLD

## 2023-06-02 ENCOUNTER — Encounter: Payer: Self-pay | Admitting: Internal Medicine

## 2023-06-02 ENCOUNTER — Ambulatory Visit: Payer: Medicare Other | Admitting: Internal Medicine

## 2023-06-02 VITALS — BP 150/85 | HR 96 | Ht 64.0 in | Wt 233.4 lb

## 2023-06-02 DIAGNOSIS — M359 Systemic involvement of connective tissue, unspecified: Secondary | ICD-10-CM

## 2023-06-02 DIAGNOSIS — Z8719 Personal history of other diseases of the digestive system: Secondary | ICD-10-CM | POA: Diagnosis not present

## 2023-06-02 DIAGNOSIS — Z86711 Personal history of pulmonary embolism: Secondary | ICD-10-CM

## 2023-06-02 DIAGNOSIS — R053 Chronic cough: Secondary | ICD-10-CM

## 2023-06-02 DIAGNOSIS — J8489 Other specified interstitial pulmonary diseases: Secondary | ICD-10-CM | POA: Diagnosis not present

## 2023-06-02 LAB — CBC WITH DIFFERENTIAL/PLATELET
Basophils Absolute: 0.1 10*3/uL (ref 0.0–0.1)
Basophils Relative: 0.9 % (ref 0.0–3.0)
Eosinophils Absolute: 0.3 10*3/uL (ref 0.0–0.7)
Eosinophils Relative: 3.8 % (ref 0.0–5.0)
HCT: 36.7 % (ref 36.0–46.0)
Hemoglobin: 11.5 g/dL — ABNORMAL LOW (ref 12.0–15.0)
Lymphocytes Relative: 37.5 % (ref 12.0–46.0)
Lymphs Abs: 2.8 10*3/uL (ref 0.7–4.0)
MCHC: 31.3 g/dL (ref 30.0–36.0)
MCV: 97.7 fL (ref 78.0–100.0)
Monocytes Absolute: 0.7 10*3/uL (ref 0.1–1.0)
Monocytes Relative: 9.5 % (ref 3.0–12.0)
Neutro Abs: 3.7 10*3/uL (ref 1.4–7.7)
Neutrophils Relative %: 48.3 % (ref 43.0–77.0)
Platelets: 160 10*3/uL (ref 150.0–400.0)
RBC: 3.76 Mil/uL — ABNORMAL LOW (ref 3.87–5.11)
RDW: 16.8 % — ABNORMAL HIGH (ref 11.5–15.5)
WBC: 7.6 10*3/uL (ref 4.0–10.5)

## 2023-06-02 MED ORDER — AZITHROMYCIN 250 MG PO TABS: 250.0000 mg | ORAL_TABLET | ORAL | 11 refills | Status: DC

## 2023-06-02 NOTE — Progress Notes (Signed)
 OV 01/03/2015  Chief Complaint  Patient presents with   Follow-up    Pt c/o DOE, resolving cough that started over the weekend, chest discomfort when SOB and when lying down. Pt c/o increasing fatigue.    She has severe class 3 dyspnea with hypoxemia on account of BMI 50, diaast dsyfn, chronic pain, deconditioning and ILD due to RA on celcept/pred/bactrim    I personally last saw her in march 2-016. Since then has seen other provicers in this office and most recently in June 2016. In June 2016 admitted for acute CHF - cath showed mild pulm htn only with PA mean 24 and minimal CAD (reviewed chart of procedures June 2016). Dx as acute diast chf. Since then better. SHe had fu HRCT 11/29/14 that shows progression in ILD since march 2016 and now with UIP pattern (personally visualized image). She is finally accepting of fact that ILD is not makin cause of dyspnea and obesity is main problem. She is wondering about dc cellcept /pred/bactrim  so as to minimize polypharmacy and side effect profile    OV 02/09/2015  Chief Complaint  Patient presents with   Follow-up    Pt states her breathing is doing well the past few days. Pt recently went to [facility] ED for CP. Pt c/o prod cough with tan mucus. Pt denies CP/tightness    She has severe class 3 dyspnea with hypoxemia on account of BMI 50, diaast dsyfn, chronic pain, deconditioning and ILD due to RA. She is having progressive ILD. But given her miserable quality of life and polypharmacy and other side effects we decided to stop the CellCept  and Bactrim . We also advise prednisone  taper. This was all in the end of August 2016. But on 01/27/2015 she ended up in the emergency room with some respiratory difficulty. Her prednisone  was bumped up. She subsequently saw her rheumatologist Dr. JONETTA was now tapering her prednisone  again. There is also some concerns of headaches and possibly temporal arteritis based on her description and apparently some kind of a  biopsy scheduled. She feels that since stopping her CellCept  and Bactrim  her cough is returned. Nevertheless weight and knee pain and back pain continued to be major issues for her along with polypharmacy. Her Polysorb was includes MS Contin   [name]    OV 02/21/2015  - a ACUTE VISIT  Chief Complaint  Patient presents with   Acute Visit    Pt c/o of chest tightness, productive cough with white/yellow mucus, and wheezing. Pt also c/o of feeling abdominal swelling/distention. Pt also c/o of constant fatigue. Pt states symptoms have been present x1 week.     Acute visit for this morbidly obese, rheumatoid arthritis patient with ILD who is now off CellCept   I saw her as recently as 02/09/2015. She reports that for the past 1 week she's having increased chest congestion, chest tightness, wheezing, shortness of breath, cough and yellow  sputum more than baseline. Symptoms are rated as moderate in severity. Relieved by inhaler. She has tried Mucinex  for 1 week with some initial partial relief but symptoms progressed. So she made an acute visit.  She continues to stay off CellCept . She is not on prednisone  10 mg per day. She tells me Dr. JONETTA has held her prednisone  at the dose because of concerns of temporal arteritis but apparently now that his diagnoses being eliminated in the differential diagnosis. She will see Dr. JONETTA in the next few weeks to decide on  further prednisone  taper.    OV 04/16/2015  Chief Complaint  Patient presents with   Follow-up    Pt states she felt much better after completing the abx and pred. Pt states her SOB is at basline, prod cough with yellow mucus at times, pt c/o chest tightness.     Morbidly obese female with rheumatoid arthritis with interstitial lung disease UIP pattern with associated deconditioning and diastolic dysfunction  She continues to remain off CellCept . Last seen in October 2016. At that time I discharged her from the  office with doxycycline  and prednisone  taper. After that early November 2016 she was given another course of Doxy and prednisone  burst. This did not help. Sputum culture at this time showed normal flora. She called again with the middle of November was given Levaquin  and prednisone  burst. She feels Levaquin  helped her immensely. At this point in time she is reporting progressive dyspnea and also worsening arthritis. She wants to go back on immunomodulators again. She has discussed Imuran with Dr. JONETTA. She is due to see Dr. JONETTA tomorrow. She also feels that she is unable to come off prednisone . She feels at a minimum she needs 10 mg of prednisone  per day. She will discuss this with Dr. D her rheumatologist tomorrow. She's also interested in a second opinion at [facility] interstitial lung disease clinic which I did discuss with her most recently. She continues to use oxygen .     OV 07/17/2015  Chief Complaint  Patient presents with   Follow-up    Pt states that she has improved. Pt c/o continued SOB with exertion, some ocugh and wheeze, and some intermittent chest tightness. Pt states that she does sleep a lot. Pt states that her heart rate remains high and that cardiology said it was due to her lungs. Pt is currently on Lasix , 80mg , but still seems to retain a lot of fluid and is not seeming to go to the bathroom as frequently.    Follow-up interstitial lung disease secondary to rheumatoid arthritis in the setting of morbid obesity, poor quality of life and heavy chronic pain opioid use and depressive symptoms  Last seen November 2016. Since then she has seen Dr. JONETTA the rheumatology clinic. She was given instructions on Imuran which she has red but she has not decided whether she should take it or not. She subsequently has followed up with [facility] interstitial lung disease clinic. CT scan of the chest reviewed result shows possible UIP pattern 06/12/2015. She had autoimmune profile which was strongly positive for  both rheumatoid factor and cyclic citrulline peptide both markers for rheumatoid arthritis. She did see Dr. Tawni B on 07/02/2015. It appears that the note is not complete but according to the patient was supposed to be a multidisciplinary conference and they will get back to her. It is possible a second immunomodulators agent and is being contemplated but patient is leery of this given prior issues with opportunistic infection not otherwise specified and admissions to the hospital. In the interim she is also seen cardiology Janr 2017 Dr. Court who did not feel dyspnea was cardiac related. I reviewed his note  OV 10/17/2015  Chief Complaint  Patient presents with   Follow-up    Pt reports she feels improved. Pt c/o continued SOB with exertion, some wheeze and occasional cough. Pt denies CP/tightness.     Follow-up interstitial lung disease secondary to rheumatoid arthritis in the setting of morbid obesity, poor quality of life and heavy chronic pain opioid  use and depressive symptoms   Since seeing me last in February 2017. She followed up at [facility] pulmonary clinic in March 2017. I reviewed those notes. She saw Dr. Tawni B on 07/31/2015. Due to the presence of bronchiectasis she's been started on  azithromycin  3 times a week. She says this has helped. In addition due to physical deconditioning and obesity she is getting home physical therapy is also help. She is interested in pulmonary rehabilitation although given obesity and back pain can be challenging. She nevertheless wants to try. She is on daily prednisone  10 mg a day. She met with Dr. JONETTA in the rheumatology clinic and has been referred now to [facility] rheumatology clinic for second opinion for a second immunomodulators. She continues to be morbidly obese and has been losing weight. She is somewhat interested in visiting with a [facility] for weight loss.   OV 02/18/2016  Chief Complaint  Patient presents with    Follow-up    Pt states she did not go to pulm rehab d/t BIL knee pain. Pt denies change in SOB, significant cough, CP/tightness and f/c/s. Pt states she is exercising at home. pt states she is doing well overall.    Problem List: 1.  Extensive bronchiectasis - cylindrical vs. Traction - has been present and not significantly changed from 2014 to 2017                       - has history of hospitalization for what sounds like pneumonia vs. Bronchiectasis flare as far back as 1999; underwent bronchoscopy which made her feel like a new woman in 1999 2.  Pulmonary fibrosis - basilar predominant reticular opacities that have not change from 2014 to 2017 3.  Rheumatoid arthritis: RF 959 ; CCP >300                       - has been treated with methotrexate , biologics, etc in the past - none in the past several years                       - currently on 7.5/5mg  alter day 4.  Morbid obesity with deconditioning - has had several hospitalizations in recent months; very deconditioned at present time  - [facility]     Followup for above  - She now follows at [facility] rheumatology and pulmonary clinics. Last seen by pulmonologist and rheumatologist at [facility] 01/28/2016. Review of the notes suggest that they have her on a slightly lower dose of prednisone  with intent to taper to 5 mg per day. He also have her on hydroxychloroquine . No further disease modifying agents are being considered for rheumatoid arthritis. In terms of her interstitial lung disease and bronchiectasis she's on conservative therapy along with Activella device. This is helping and she feels better. Today she came in walking with her walker. This the happiest I have f seen her. She's not lost any weight but she learned in the [employer] lifestyle clinic. She tried to go to pulmonary rehabilitation but could not because of knee pain. She is up-to-date with pneumonia vaccine. She's had a flu shot this  season.    OV 03/18/2016  Chief Complaint  Patient presents with   Acute Visit    Pt states breathing is not that bad today. Pt states she has cogestion build up, no tightness, SOB w/o excertion. Pt states  breathing has been better since last visit except the mucus. No cough feels the need to cough but can not.      follow-up bronchiectasis and immunosppessive thrapy. Visit 3 month olow-up. Body mssindex is stll very high but she says she has lost weight. Overal well. Last few days increasd symptoms of mucus, cough and subjective unwellness. No fever or colored sputum. But she feells she needs antibiotic and prednisone   OV 06/23/2016  Chief Complaint  Patient presents with   Follow-up    Pt states her SOB has been doing well but she has been more fatigued. Pt has an occ prod cough with light yellow mucus and chest tightness when SOB - resolves with rest.    Follow-up bronchiectasis with interstitial lung disease on immunosuppressive therapy due to rheumatoid arthritis. Last visit 01/17/2016. Since then she's lost 5 pounds of weight according to history. . She is here with her daughter. No acute bronchitis symptoms. Overall stable. The main issues that she's having increased joint pain and she is only on plaquenil . No fever or colored sputum.   OV 02/16/2017  Chief Complaint  Patient presents with   Follow-up    Pt states breathing has been okay. Stated she had a URI x1 month ago which she developed while in rehab after hip fx. Occ. SOB and occ. chest tightness. Denies any cough at this time.    Follow-up bronchiectasis with interstitial lung disease on immunosuppressive therapy due to rheumatoid arthritis. Last visit was 6 months ago approximately. Since then she has continued to lose weight through dietary control. She feels better. However in the summer 2018 she fell down due to orthostasis and fractured her left hip. This wasn't complicated by MRSA infection according to her history.  She is now undergoing physical therapy. Overall she's feeling well. She is very proud of her weight loss. Dyspnea is improved. She missed [facility] appointment for ILD clinic but she will make this again. She uses oxygen  with heavy exertion at physical therapy and at night.  OV 02/11/2018  Subjective:  Patient ID: Taffy JULIANNA Battiest, female , DOB: 03/17/1941 , age 57 y.o. , MRN: [id number] , ADDRESS: [address] [id number] [place] [address]   02/11/2018 -   Chief Complaint  Patient presents with   Follow-up    Pt states she has been doing okay since last visit. States she has had some pain in the mid region of her chest x3 weeks and has also been having problems with fatigue. States she has had some occ coughing. Her main complaints are the pain in her chest and the exhaustion.     HPI Ethelyne F Rasp 83 y.o. -follow-up ILD with bronchiectasis in the setting of rheumatoid arthritis and obesity and physical deconditioning  It is almost a year since I last saw her.  Since then her daughter died in [place]  with advanced sarcoma.  She is getting grief counseling from hospice.  She also has palliative care support at home.  She also in the last year has fallen and hurt her back.  She is on attending physical therapy which is helping her.  She says she is lost 30 pounds in the last 1 year.  Because of all this functional status is somewhat better but still she is extremely dyspneic.  In fact when she walked 185 feet x 3 laps on room air using a walker: She is only able to complete a lap and a half and stop because of extreme shortness of  breath.  Her resting pulse ox is 98%.  Final pulse ox was 98%.  And her heart rate went from 107/min to 140/min.  She has not yet had a flu shot.  Of note she is on prednisone  15 mg/day which has helped her arthritis.  She is asking if it is okay to go back down to 10 mg/day from a lung standpoint.  She is also on a different arthritis immunomodulator which is helped her  rheumatoid arthritis pain.  She gets this treatment from [facility] rheumatology.  Pulmonary ILD medications also being managed at [facility].      OV 02/10/2019  Subjective:  Patient ID: Taffy JULIANNA Battiest, female , DOB: 02-05-1941 , age 56 y.o. , MRN: [id number] , ADDRESS: [address] [place] [address] Kharis F Battiest. -follow-up ILD with bronchiectasis in the setting of rheumatoid arthritis and obesity and physical deconditioning  Problem List - copy and paste from [facility] - DR Tawni B: 1.  Extensive bronchiectasis - cylindrical vs. Traction - has been present and not significantly changed from 2014 to 2017                       - has history of hospitalization for what sounds like pneumonia vs. Bronchiectasis flare as far back as 1999; underwent bronchoscopy which made her feel like a new woman in 1999 2.  Pulmonary fibrosis - basilar predominant reticular opacities that have not change from 2014 to 2017 3.  Rheumatoid arthritis: RF 959 ; CCP >300                       - has been treated with methotrexate , biologics, etc in the past - none in the past several years                       - currently on 10 mg prednisone  daily 4.  Morbid obesity with deconditioning - has had several hospitalizations in recent months; very deconditioned at present time   02/10/2019 -   Chief Complaint  Patient presents with   Follow-up    Pt here for ILD follow up. Pt states her breathing is unchanged since last OV in 01/2018. Pt denies new symptoms. Pt denies CP/tightness and f/s/c.      HPI Emmaclaire F Nadel 83 y.o. -returns for follow-up.  I personally not seen her in 1 year.  In the interim she is kept up with follow-up at [facility].  In January 2020 she suffered from left lower extremity DVT after having shortness of breath.  She was admitted at [facility] and started on Eliquis .  She still continues with Eliquis .  She tells me that Xeljanz  was  considered as the cause although she does have obesity and sedentary lifestyle and venous incompetency.  It appears this was her first DVT.  She has had a CT angiogram chest in 2005 that was negative for PE and a Doppler ultrasound in 2016 and 2018 that was negative for DVT.  She says a cardiologist here locally is managing her Eliquis  and plans to stop it after several months.  In terms of her rheumatoid arthritis she is now under the care of [facility] rheumatology.  They just having her on prednisone .  In July 2020 she also saw Dr. KATHEE pulmonary at [facility].  Apparently a walk test could not be performed.  She says she has lost weight.  Her main complaint is exertional dyspnea.  She wants to do portable oxygen .  She is only on nighttime oxygen .  In the past she always gets dyspneic way before she even desaturates a little bit this is on account of her obesity and deconditioning.  Her son is here with with her today.  This the first time I am meeting him.  He tells me that her breathing at night is rough although not as bad as his dad sleep apnea.  Patient is not on CPAP.  He is also curious about qualifying oxygen  with walking.  Walking desaturation test for an 85 feet x 3 laps: She basically walked only half a lap.  Resting pulse ox was 97% with a heart rate of 102.  Final pulse ox was 96% with a heart rate of 122 and she stopped because of dyspnea.  She had a most recent CT chest there is resulted below and it is stable.   IMPRESSION: CT Chest high resolution 1. Mid and lower lung zone predominant bronchiectasis, peribronchovascular ground-glass and scattered subpleural reticular densities, grossly stable from 11/29/2014. Findings are nonspecific and can be seen with nonspecific interstitial pneumonitis. Usual interstitial pneumonitis is considered less likely but respiratory motion and expiratory phase imaging make assessment difficult. 2. Coronary artery calcification.     Electronically  Signed   By: Newell Eke M.D.   On: 11/24/2018 13:04  ROS - per HPI  Results for KELSEA, MOUSEL (MRN [id number]) as of 02/10/2019 14:47  Ref. Range 04/17/2015 18:02 04/18/2015 13:30  D-Dimer, Earleen Latest Ref Range: 0.00 - 0.50 ug/mL-FEU 2.57 (H) 2.54 (H)   OV 06/21/2019  Subjective:  Patient ID: Taffy JULIANNA Gerre, female , DOB: [birth year] , age 61 y.o. , MRN: [id number] , ADDRESS: [address] [place] [address]   06/21/2019 -   Chief Complaint  Patient presents with   Follow-up    Pt states she was recently admitted to hospital at [facility] for 10 days. Since then, pt has been  having some low O2 sats and issues with SOB. Pt states she is better now than she was.   Tonni F Norgaard. -follow-up ILD with bronchiectasis in the setting of rheumatoid arthritis and obesity and physical deconditioning  Problem List - copy and paste from [facility] - DR Tawni B: 1.  Extensive bronchiectasis - cylindrical vs. Traction - has been present and not significantly changed from 2014 to 2017                       - has history of hospitalization for what sounds like pneumonia vs. Bronchiectasis flare as far back as 1999; underwent bronchoscopy which made her feel like a new woman in 1999 2.  Pulmonary fibrosis - basilar predominant reticular opacities that have not change from 2014 to 2017 and in dec 2020 CT chest 3.  Rheumatoid arthritis: RF 959 ; CCP >300                       - has been treated with methotrexate , biologics, etc in the past - none in the past several years                       - currently on 10 mg prednisone  daily 4.  Morbid obesity with deconditioning - has had several hospitalizations in recent months; very deconditioned at present time   HPI Maryann F Carchi 83  y.o. -admitted approximately 2 months ago early December 2020 with Klebsiella pansensitive urinary tract infection and bilateral lower extremity cellulitis.  Discharge on 2 L nasal  cannula.  Recommendations for home physical therapy were made.  Bilateral Doppler extremities were negative in the hospital.  Her CODE STATUS is full.  She returns for routine follow-up.  Her daughter is with her.  She continues to lose weight.  She has lost around 40 to 50 pounds.  She says she is on the medication for rheumatoid arthritis I do not know which one.  She says her cellulitis was quite bad and she is suffered from it.  But nevertheless with the weight loss her hypoxemia and overall joint pain is better.  She still has significant pedal edema and erythema in her lower extremities.  She is asking for some advice with that.  Otherwise no issues.  Using 2 L nasal cannula at night.  Walks around with a walker.  Symptom score is listed b       11/22/2020- Interim hx  with NP Patient presents today for 2 week follow-up PNA. She had a CXR that showed slight increase in opacity right lung base, questionable infiltrate. Treated with Augmentin  1 tab BID x 10 days. She is feeling a great deal better. She no longer has right sided pain. She still has some wheezing and cough with yellow mucus. She is taking muciex 1,200mg  twice a day. She is using her flutter valve 3-4 times a day. She has been using nebulizer twice a day. She takes 10mg  prednisone  daily. She is on Toprol  XL 75mg , she has not taken this yet today. She takes Azithromycin  MWF pulmonary with [facility]. She can resume this. Needs repeat CXR today.  TEST/EVENTS : High-resolution CT chest November 24, 2018 mid to lower lung bronchiectasis, groundglass reticular densities stable since 2016  OV 12/10/20 at [facility]\  Interval History:  Has been doing ok until about a month ago.  Had pneumonia on the right side a month ago. Even now, sometimes she feels that she has pain in her R chest - upper front chest. Even sometimes today, it feels kind of bad.   She was doing a lot of mucus production. They she started hurting badly on the right side.  Thought she was sleeping too much. Did not pay it any mind. Went to see the doctor and she told them about the pain. They did an xray and diagnosed with pneumonia. She did not have any fever or chills - but she says that she very seldom in her life has had temp >99. She took antibiotics. Follow up xray revealed clearing. She was put on prednisone  40 mg with taper. She still gets kind of hoarse and has mucus in her upper throat. She is scheduled now for surgery for cataracts on August 9th.   She has started getting joint pain back in her knees again.   She was out of azithromycin . She has had a hard time getting this from the [place]. Spoke with triage nurse. Needs a 90 day supply.   Using Acapella. She has been using her albuterol  nebulizer at home. She still has a lot of wheezing. This wheezing sometimes clears with coughing mucus, sometimes does not. Unsure if this gets better with the nebulizer. Happens more when she is laying down.   At the local pulmonologist, she needed oxygen  and now has oxygen  at home. Using [employer]. She is using 2L with (sometimes 3L). This is with  laying down. When walking and moving, she keeps it on 2L.   Mucus production is getting better. She is using the acapella three or four times per day. She is using the nebulizer twice per day.   Has R chest discomfort which feels a bit worse today than prior. She says that it feels like a strain and it affects her voice. When she talks, it is like it is pulling or straining something.   Has help now every day at home. Son has been on her case about that.   Is on 10 mg prednisone  - and has been on this dose for about a week.   Outside echo 06/04/2018: Study Conclusions   - Left ventricle: The cavity size was normal. There was mild focal    basal hypertrophy of the septum. Systolic function was normal.    The estimated ejection fraction was in the range of 60% to 65%.    Wall motion was normal; there were no regional wall motion     abnormalities.  - Right ventricle: The cavity size was mildly dilated. Wall    thickness was normal.  - Pulmonary arteries: Systolic pressure was mildly increased.   Echo pharm stress test with contrast 12/15/2018:  NORMAL STRESS TEST. NORMAL RESTING STUDY WITH NO WALL MOTION ABNORMALITIES AT REST AND PEAK STRESS. NO DOPPLER PERFORMED FOR VALVULAR REGURGITATION NO DOPPLER PERFORMED FOR VALVULAR STENOSIS NORMAL RESTING BP - APPROPRIATE RESPONSE  Cath done on 10/31/14:  Mild CAD without significant obstructive disease. Ectasia in all three  major proximal vessels.  Mild pulmonary hypertension. Aortic saturation 87%. Pulmonary artery  saturation 60%. Cardiac output 7.6 L. Cardiac index 3.3.  Continue medical therapy for lung disease. Continue preventative therapy  for her heart disease.Addendum by Dann Candyce RAMAN, MD on 10/31/2014 12:01 PM  Mild CAD without significant obstructive disease. Ectasia in all three  major proximal vessels.  Mild pulmonary hypertension. Aortic saturation 87%. Pulmonary artery  saturation 60%. Cardiac output 7.6 L. Cardiac index 3.3.  Current Functional Status:  Oxygen : uses 2-3L of oxygen  at home PRN and with sleep.  Level of exertion that leads to dyspnea: Walking any distance makes her dyspneic.   Rehab: using the walker most of the time. When she goes out, she uses a wheelchair. Home PT was helpful. She had a problem getting in and out of vehicle and bed.  Laboratory Data/Test Results: Pulmonary Function Test (PFT) Latest Ref Rng & Units 07/02/2015 12/14/2017  FVC PRE L 1.78 1.58  FVC % PRE PRED % 63 57  FEV1 PRE L 1.49 1.33  FEV1 % PRE PRED % 70 65  FEV1/FVC PRE % 83.74 84.24  FEF25-75% PRE L/s 2.83 2.39  FEF25-75% % PRE PRED % 154 141  DLCO PRE ml/(min*mmHg) 11.28 11.41  DLCO % PRE PRED % 65 67   No PFTs today.  PFTs 12/14/17: FVC 1.58, 57 % predicted. FEV1 1.33, 65% predicted. FEV1/FVC 84.24. DLCO 11.41, 67% predicted. Compared to  06/2015, FVC is down 11.2%. DLCO is essentially stable.   Echocardiogram: 11/21/2015 - EF >55% with normal wall motion. Mildly enlarged RV with normal free wall. Mildly enlarged RA. Normal respiratory collapse of IVC. Peak RV pressure 41 mmHg.   EOTT today: Room air Spo2 96% at rest. HR at start of exercise was 122 / max HR was 150 during exercise = 106% predicted. Lowest SpO2 95% on room air. Walked 258 meters = 846 feet = 81% predicted. Total walk time = 5 minutes. Borg  dyspnea scale = 7. Test ended early because of dyspnea, fatigue, and chest tightness.   Labs:  None today  Impression: Patient is a 83 y.o. with PMH significant for rheumatoid arthritis, bronchiectasis (with flare vs. Pneumonia in late 2016), pulmonary fibrosis (not progressed per CT from 2014 to 06/2015), morbid obesity, and deconditioning who comes to pulmonary clinic for follow up of lung disease and dyspnea. She has been maintained on an aggressive bronchiectasis regimen (Acapella, nebulizer, TIW azithromycin ) and she has done well. Unfortunately, she ran out of azithromycin , and in the setting of this, she developed increased cough and sputum and was treated for pneumonia.   Today, she feels like she is recovered from the PNA, but she is not all the way back to her baseline. She has some congestion in her throat and she is concerned about pain in her chest (which is reproducible with palpation over the sternum.  She has limited mobility at home. She uses furniture / counters to get around if she is not using her walker. She does some physical therapy.   Plan - from patient instructions:  I have renewed your azithromycin  - you have a year supply that will be sent to [facility] in [place], [place] [facility] [place]). You should take this every Monday, Wednesday, Friday.   I am also going to write a prescription for a 1 week supply of DAILY azithromycin . So take it daily for 7 days, then switch to every Monday, Wednesday, Friday. The  prescription is written so that this can all be under a single prescription.   Continue to use your Acapella at least 3-4 times per day.   We are going to get another chest x ray today.   Regarding your nebulizer, we will renew albuterol  order from [facility].   Plan to take your nebulizer three times per day (along with Acapella use). Once your congestion is improved, you may back off to twice per day.   Try to use your voltaren  gel on your chest (nearly your sternum where it is sore) to see if this helps with your pain.   Return in about 6 months (around 06/12/2021).     OV 05/23/2021  Subjective:  Patient ID: Taffy JULIANNA Battiest, female , DOB: 22-Aug-1940 , age 37 y.o. , MRN: [id number] , ADDRESS: [address] [place] [address] PCP Mercer Clotilda SAUNDERS, MD Patient Care Team: Mercer Clotilda SAUNDERS, MD as PCP - General (Family Medicine) Court Dorn PARAS, MD as PCP - Cardiology (Cardiology) Duffy, Macario PEDLAR, LCSW as Social Worker (Licensed Clinical Social Worker) Kidney, Cheron Nash, Maya, MD as Consulting Physician (Rheumatology) Vincente Pfeiffer, NP (Rheumatology) Lyndal Maus, MD as Referring Physician (Pulmonary Disease)  This Provider for this visit: Treatment Team:  Attending Provider: Geronimo Amel, MD    05/23/2021 -   Chief Complaint  Patient presents with   Follow-up    Pt states since last OV, she has been in and out of the hospital. States she also ended up with Covid and has had problems with a lot of coughing and phlegm build-up. Pt's O2 sats had been dropping and had to bump O2 up to 3L    Viera F Brumm. -follow-up ILD with bronchiectasis in the setting of rheumatoid arthritis and obesity and physical deconditioning  Problem List - copy and paste from [facility] - DR Maus B: 1.  Extensive bronchiectasis - cylindrical vs. Traction - has been present and not significantly changed from 2014 to 2017                       -  has history of  hospitalization for what sounds like pneumonia vs. Bronchiectasis flare as far back as 1999; underwent bronchoscopy which made her feel like a new woman in 1999 2.  Pulmonary fibrosis - basilar predominant reticular opacities that have not change from 2014 to 2017 and in dec 2020 CT chest 3.  Rheumatoid arthritis: RF 959 ; CCP >300                       - has been treated with methotrexate , biologics, etc in the past - none in the past several years                       - currently on 10 mg prednisone  daily 4.  Morbid obesity with deconditioning - has had several hospitalizations in recent months; very deconditioned at present time  - intentl wt loss +  HPI Marypat F Yahr 83 y.o. -returns for follow-up.  I personally not seen her in 2 years almost.  In between she is followed up with nurse practitioner and also [facility].  She is here with her daughter to [place] who I am meeting for the first time.  They tell me that she has had 2 admissions for dehydration in 2022.  After that she went to [facility] re[facility] where in November 2022 she ended up with COVID and was given antiviral.  They were unhappy with the admission to [facility] rehab.  They do not want to go there again.  Apparently after that she is now needing 3 L of oxygen  while previously she is only needing 2 L of nasal cannula oxygen .  Last CT scan of the chest echocardiogram 2020 including review of the medical records at [facility] Last pulmonary function test with us  was 2015 while at [facility] it was in 2019.  She continues to have functional disability.  She uses a walker to get around and change her clothes.  She continues to be immunosuppressed with prednisone , Plaquenil  and also leflunomide  for rheumatoid arthritis.  She has chronic pain and is on opioids \\CT  Chest data  No results found.   OV 07/12/2021  Subjective:  Patient ID: Taffy JULIANNA Battiest, female , DOB: August 14, 1940 , age 38 y.o. , MRN:  [id number] , ADDRESS: [address] [place] [address] PCP Mercer Clotilda SAUNDERS, MD Patient Care Team: Mercer Clotilda SAUNDERS, MD as PCP - General (Family Medicine) Court Dorn PARAS, MD as PCP - Cardiology (Cardiology) Duffy, Macario PEDLAR, LCSW as Social Worker (Licensed Clinical Social Worker) Kidney, Cheron Nash, Maya, MD as Consulting Physician (Rheumatology) Vincente Pfeiffer, NP (Rheumatology) Lyndal Maus, MD as Referring Physician (Pulmonary Disease)  This Provider for this visit: Treatment Team:  Attending Provider: Geronimo Amel, MD    07/12/2021 -   Chief Complaint  Patient presents with   Follow-up    PFT performed today.   Pt states her breathing is about the same since last visit. States she is no longer coughing up as much mucus compared to last visit.    Problem List - copy and paste from [facility] - DR Maus B: 1.  Extensive bronchiectasis - cylindrical vs. Traction - has been present and not significantly changed from 2014 to 2017                       - has history of hospitalization for what sounds like pneumonia vs. Bronchiectasis flare as far back  as 1999; underwent bronchoscopy which made her feel like a new woman in 1999 2.  Pulmonary fibrosis - basilar predominant reticular opacities that have not change from 2014 to 2017 and in dec 2020 CT chest 3.  Rheumatoid arthritis: RF 959 ; CCP >300                       - has been treated with methotrexate , biologics, etc in the past - none in the past several years                       - currently on 10 mg prednisone  daily 4.  Morbid obesity with deconditioning - has had several hospitalizations in recent months; very deconditioned at present time  - intentl wt loss +  5. COvid - Nov 2022 associated with hospitalizations for dehydration   HPI Elfa F Trouten 83 y.o. -returns for follow-up.  She presents with her daughter Brad.  She is here to review results.  She does say that after her  COVID in 07 April 2021 her dyspnea is worse and then also oxygen  uses up to 3 L.  Compared to the last visit she is stable but definitely compared to a few years ago she is more short of breath and requiring more oxygen .  She also reports that since November 2022 her right fifth finger has more lateral deviation.  In her fourth finger seems to be in a fixed abnormality.  She did try a short course prednisone  with her primary care physician and it does not help.  She has rheumatology appointment pending with Dr. Claryce at [facility] next month.  Her test results show that her echo is essentially normal.  No comment about pulmonary hypertension.  Her pulmonary function test shows FVC declined compared to 2019 at [facility] and also 2015 with us .  This correlates with increased shortness of breath over the last few years increased subjective oxygen  need.  She did have a high-resolution CT scan of the chest that our radiologist feels there is progression and she has UIP pattern with emerging honeycombing.  I personally visualized various CT scans of the chest.  I am not so sure about progression but I did tell her that I would put this up for multidisciplinary case conference.  We discussed care for progressive interstitial lung disease in the setting of rheumatoid arthritis.  Did discuss about registry protocol and I gave her the consent form.  She is interested in this.  Also discussed about nintedanib antifibrotic.  Did discuss about diarrhea as a side effect and GI issues.  She says she is constipated she and her daughter feel with polypharmacy that she should easily be able to handle this 1 other medication.  They are interested in this.  They are willing to wait till multidisciplinary case conference is done.  We also discussed the fact with WHO group 3 pulmonary hypertension inhaled treprostinil as an option.  She is never had a right heart cath.  I have written to Dr. Dorn Lesches her  cardiologist to consider right heart catheterization.  We will regroup in a couple of months.    MARCH 2023 ILD condrerence with Dr Bonnetta  Taffy PHEBE Gerre DOB: 1940-12-02 MRN:  [id number] Female, 83 y.o. Dr. Geronimo    RA with ILD . Stable for years but then covid nov 2022 Rx as outpaitent. Since then o2 needs 2 -> 3L /PFT - slightly worse  FVC ? technique issue becase could not do dlco. CT reported as UIP and worse but MR not sure.   Question: is CT really worse? If so, she qualifies for registry study and ofev .     HRCT: 06/19/2021 HRCT: 11/24/2018 HRCT: 11/29/2014 HRCT: 07/20/2014 HRCT: 10/05/2013 HRCT: 04/08/2013 No lung biopsy    Concordant Between MDD and official read  On laest scan there is ILD and Tracheobroncholamacia and also diffuse Traction bronchiectasis without ILD. In LL there is bialteral LL HC., subpleural disease UIP that is unusual - suggestive of CTD.     Can approach as ILD     xxxxxxxxxxxxxxxx                   [facility] 08/12/21  HEMODYNAMICS:    1: Right atrial pressure-20/18 2: Right ventricular pressure-37/17, mean 22 3: Pulmonary artery pressure-43/19, mean 30 4: Pulmonary wedge pressure-A-wave 23, V wave 20, mean 22 - does not meet tyvaso criteria 5: Cardiac output-4.97 L/min with an index of 2.43 L/min/m 6: PVR-1.81 -0 does not meet tyvaso cirtiera     IMPRESSION: Ms. Luckenbaugh has moderately elevated right atrial pressures and mildly elevated pulmonary artery pressures.  Her PA saturations were 58%.  The sheath was removed and a bandage was placed over the right antecubital puncture site.  The patient left the lab in stable condition.  She will be discharged home later this morning and to follow-up with Dr. Geronimo and myself.   Dorn Lesches. MD, [facility] 08/12/2021 8:06   OV 09/13/2021  Subjective:  Patient ID: Taffy JULIANNA Gerre, female , DOB: [birth year] , age 67 y.o. , MRN: [id number] , ADDRESS: [address] [place]  [id number] PCP Mercer Clotilda SAUNDERS, MD Patient Care Team: Mercer Clotilda SAUNDERS, MD as PCP - General (Family Medicine) Lesches Dorn PARAS, MD as PCP - Cardiology (Cardiology) Duffy, Macario PEDLAR, LCSW as Social Worker (Licensed Clinical Social Worker) Kidney, Cheron Nash, Maya, MD as Consulting Physician (Rheumatology) Vincente Pfeiffer, NP (Rheumatology) Lyndal Maus, MD as Referring Physician (Pulmonary Disease)  This Provider for this visit: Treatment Team:  Attending Provider: Geronimo Amel, MD    09/13/2021 -   Chief Complaint  Patient presents with   Follow-up    Pt states she has been doing okay since last visit. States her breathing is about the same.   Problem List - copy and paste from [facility] - DR Maus B: 1.  Extensive bronchiectasis - cylindrical vs. Traction - has been present and not significantly changed from 2014 to 2017                       - has history of hospitalization for what sounds like pneumonia vs. Bronchiectasis flare as far back as 1999; underwent bronchoscopy which made her feel like a new woman in 1999 2.  Pulmonary fibrosis - basilar predominant reticular opacities that have not change from 2014 to 2017 and in dec 2020 CT chest 3.  Rheumatoid arthritis: RF 959 ; CCP >300                       - has been treated with methotrexate , biologics, etc in the past - none in the past several years                       - currently on 10 mg prednisone  daily 4.  Morbid obesity with deconditioning - has had several hospitalizations  in recent months; very deconditioned at present time  - intentl wt loss +  5. COvid - Nov 2022 associated with hospitalizations for dehydratio  HPI Evamarie F Prettyman 83 y.o. -returns for follow-up.  After the last visit we discussed the multidisciplinary case conference.  Independent radiologist concurred there was UIP and she has had slow progression.  She had right heart catheterization and there is no pulmonary  hypertension to qualify her for inhaled treprostinil based on WHO group 3.  Overall she is better from a RA perspective.  She is doing physical therapy.  No new complaints.  She is still following a strict diet she is lost some more weight.  We discussed nintedanib as an antifibrotic.  She wants to do this.  She has constipation.  Did discuss that sometimes she could have diverticulitis and abdominal . Which is very rare but have seen 1 or 2 patients in the setting of opioids do this especially with nintedanib.  She is accepting of the risk.  Discussed rare possibilities of bleeding and MI.  She is understanding.  She wants to start the medication.    OV 02/04/2022  Subjective:  Patient ID: Taffy JULIANNA Gerre, female , DOB: 1940/09/26 , age 10 y.o. , MRN: [id number] , ADDRESS: [address] [place] [address] PCP Mercer Clotilda SAUNDERS, MD Patient Care Team: Mercer Clotilda SAUNDERS, MD as PCP - General (Family Medicine) Court Dorn PARAS, MD as PCP - Cardiology (Cardiology) Duffy, Macario PEDLAR, LCSW as Social Worker (Licensed Clinical Social Worker) [place], [facility] Dolphus Reiter, MD as Consulting Physician (Rheumatology) Vincente Pfeiffer, NP (Rheumatology) Lyndal Maus, MD as Referring Physician (Pulmonary Disease)  This Provider for this visit: Treatment Team:  Attending Provider: Geronimo Amel, MD    02/04/2022 -   Chief Complaint  Patient presents with   Follow-up    Follow up after PFT. Pt fell on Sunday and she fell a week before that as well.     HPI Timmie F Svehla 83 y.o. -returns for follow-up.  She says that few days ago she had slurred speech noticed by the family members well on the telephone and later when she went to the bathroom she fell patient sustained some injuries.  She went to ER all the imaging work-up as listed below.  Everything was reassuring.  The suspecting a TIA and she has been referred to a neurologist.  Her blood work 02/02/2022  was fairly unremarkable.  She has been on nintedanib since I last saw her she is only in the low-dose 100 mg twice daily because of her polypharmacy and overall poor functional status but even with that she is having significant diarrhea and fatigue.  All her symptom scores are worse.  The son also feels that her overall general wellbeing is decline in the last few weeks.  She definitely attributes the diarrhea to the nintedanib.  She is not sure about the fatigue but she believes it could be from the nintedanib.  There is no GI discomfort other than diarrhea.    CT Chest data - HRT FEb 2023  Narrative & Impression  CLINICAL DATA:  83 year old female with history of chest tightness and shortness of breath. Prior history of COVID infection in December 2022.   EXAM: CT CHEST WITHOUT CONTRAST   TECHNIQUE: Multidetector CT imaging of the chest was performed following the standard protocol without intravenous contrast. High resolution imaging of the lungs, as well as inspiratory and expiratory imaging, was performed.  RADIATION DOSE REDUCTION: This exam was performed according to the departmental dose-optimization program which includes automated exposure control, adjustment of the mA and/or kV according to patient size and/or use of iterative reconstruction technique.   COMPARISON:  Chest CT 01/16/2021.   FINDINGS: Cardiovascular: Heart size is normal. There is no significant pericardial fluid, thickening or pericardial calcification. There is aortic atherosclerosis, as well as atherosclerosis of the great vessels of the mediastinum and the coronary arteries, including calcified atherosclerotic plaque in the left anterior descending coronary artery.   Mediastinum/Nodes: No pathologically enlarged mediastinal or hilar lymph nodes. Esophagus is unremarkable in appearance. No axillary lymphadenopathy.   Lungs/Pleura: High-resolution images demonstrate some patchy areas of  ground-glass attenuation, septal thickening, subpleural reticulation, mild cylindrical traction bronchiectasis, peripheral bronchiolectasis and a few areas of apparent developing honeycombing. These findings have a definitive craniocaudal gradient and appear minimally progressive compared to the recent prior examination. Inspiratory and expiratory imaging demonstrates some mild air trapping indicative of mild small airways disease. No acute consolidative airspace disease. No pleural effusions. No definite suspicious appearing pulmonary nodules or masses are noted.   Upper Abdomen: Unremarkable.   Musculoskeletal: There are no aggressive appearing lytic or blastic lesions noted in the visualized portions of the skeleton.   IMPRESSION: 1. The appearance of the lungs is considered diagnostic of usual interstitial pneumonia (UIP) per current ATS guidelines, demonstrating mild progression compared to prior examinations. 2. Aortic atherosclerosis, in addition to left anterior descending coronary artery disease. Please note that although the presence of coronary artery calcium  documents the presence of coronary artery disease, the severity of this disease and any potential stenosis cannot be assessed on this non-gated CT examination. Assessment for potential risk factor modification, dietary therapy or pharmacologic therapy may be warranted, if clinically indicated.   Aortic Atherosclerosis (ICD10-I70.0).     Electronically Signed   By: Toribio Aye M.D.   On: 06/21/2021 07:48    OV 04/01/2022  Subjective:  Patient ID: Taffy JULIANNA Battiest, female , DOB: 1941-05-03 , age 40 y.o. , MRN: [id number] , ADDRESS: [address] [place] [address] PCP Ines Onetha NOVAK, MD Patient Care Team: Ines Onetha NOVAK, MD as PCP - General (Neurology) Court Dorn PARAS, MD as PCP - Cardiology (Cardiology) Duffy, Macario PEDLAR, LCSW as Social Worker (Licensed Clinical Social Worker) [place],  [facility] Dolphus Reiter, MD as Consulting Physician (Rheumatology) Vincente Pfeiffer, NP (Rheumatology) Lyndal Maus, MD as Referring Physician (Pulmonary Disease)  This Provider for this visit: Treatment Team:  Attending Provider: Geronimo Amel, MD    04/01/2022 -   Chief Complaint  Patient presents with   Follow-up    Pt states she has been doing okay since last visit.     HPI Deborh F Pritts 83 y.o. -after seeing me mid September 2023 we stopped nintedanib because of severe diarrhea.  The diarrhea resolved pretty much within a few days.  After that she was doing okay but was having intermittent nausea.  1 such episode was severe on 03/01/2022 approximately 3 weeks after stopping nintedanib] she ended up in the ER.  Was found to have: Adherent to the bladder and the ovary.  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  This was the sigmoid colon.  She also has intermittent left lower quadrant pain.  She was seen by [name] nurse practitioner 03/11/2022.  A colonoscopy has been scheduled.  03/21/2022 cardiology clearance was obtained.    Otherwise, from a shortness of breath perspective she is stable.  There is no changes here  From a fall perspective she is seen neurology 03/06/2022.  She saw Dr. Darleen.  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  Also multifocal peripheral predominant bilateral chronic microhemorrhage may indicate cerebral amyloid angiopathy.  Mini-Mental scale was 22/30 Aricept  is being started.Today she is here with her daughter Stephens.  She was able to understand everything I was saying and giving a good history.  She gave all the details about her neurology visit GI visit all accurately.  Also the ER visit.  CT Chest data  OV 05/27/2022  Subjective:  Patient ID: Taffy JULIANNA Battiest, female , DOB: November 08, 1940 , age 66 y.o. , MRN: [id number] , ADDRESS: [address] [place] [address] PCP Mercer Clotilda SAUNDERS,  MD Patient Care Team: Mercer Clotilda SAUNDERS, MD as PCP - General (Family Medicine) Court Dorn PARAS, MD as PCP - Cardiology (Cardiology) Duffy, Macario PEDLAR, LCSW as Social Worker (Licensed Clinical Social Worker) [place], [facility] Dolphus Reiter, MD as Consulting Physician (Rheumatology) Vincente Pfeiffer, NP (Rheumatology) Lyndal Maus, MD as Referring Physician (Pulmonary Disease)  This Provider for this visit: Treatment Team:  Attending Provider: Geronimo Amel, MD 05/27/2022 -   Chief Complaint  Patient presents with   Follow-up    Some SOB and pain on right breast area.  Not every day.     HPI Tywanna F Fujita 83 y.o. -presents for follow-up with her daughter.  Patient is giving most of the history but daughter is corroborating certain key parts.  Since her last visit she visited Dr. Mliss freed in the interstitial lung disease clinic at [facility].  Her assessment was to hold off on pirfenidone given patient's current GI issue and very slow progression only.  I concur with this.  I reviewed her note.  That was the main question for this visit.  From a ILD perspective she continues to be stable compared to last visit.  There is no pulmonary function test this visit but symptom wise she is stable.  Her main issues are GI issues.  She has seen Dr. Lynda Leos at [facility] surgery in [place] in December 2023.  I reviewed his note as well.  He has recommended very conservative line of management with MiraLAX .  The belief is that opioids with chronic constipation caused the diverticulitis.  We did do some vaccine counseling.  She has had a flu shot but she is yet to have her RSV vaccine and also shingles vaccine.  We discussed about this.  She has had a COVID vaccines      OV 12/22/2022  Subjective:  Patient ID: Taffy JULIANNA Battiest, female , DOB: [birth year] , age 10 y.o. , MRN: [id number] , ADDRESS: [address] [place] [address] PCP  Mercer Clotilda SAUNDERS, MD Patient Care Team: Mercer Clotilda SAUNDERS, MD as PCP - General (Family Medicine) Court Dorn PARAS, MD as PCP - Cardiology (Cardiology) Duffy, Macario PEDLAR, LCSW as Social Worker (Licensed Clinical Social Worker) Pa, [facility] Dolphus Reiter, MD as Consulting Physician (Rheumatology) Vincente Pfeiffer, NP (Rheumatology) Lyndal Maus, MD as Referring Physician (Pulmonary Disease) Lionell Jon DEL, RPH (Inactive) (Pharmacist)  This Provider for this visit: Treatment Team:  Attending Provider: Geronimo Amel, MD    12/22/2022 -   Chief Complaint  Patient presents with   Follow-up    F/up on PFT     HPI Marion F Estala 82 y.o. -presents for follow-up.  She tells me that her shortness  of breath is stable.  Infective pulmonary function test shows recent stability.  There are no issues here.  Her antifibrotic's are on hold after nintedanib related GI issues and subsequent ongoing diverticular issues.  Most recently last week she was admitted for lower GI bleed.  Eliquis  was held but she was told to restart.  She has not restarted this because of fear of recurrent bleeding.  She is also having chronic back pain and she wants to have spinal injections.  She wants to know if it is okay for her to hold her Eliquis .  She has had PE 1 year ago but remotely she did have some DVT as well.  At this point in time Eliquis  on hold no active bleeding.  We did discuss the pros and cons of holding Eliquis .  Did indicate for the moment that we can hold.  This will allow her to get some control with her bleeding and help her anemia resolve and also get some spinal injections.  We can check D-dimer [she specifically asked for her D-dimer to be checked] I suspect it will still be elevated because of recent hospitalization and then recheck again in a few months.  If it is still elevated we can consider low-dose Eliquis .  She is agreeable with this plan.     OV  06/02/2023  Subjective:  Patient ID: Taffy JULIANNA Battiest, female , DOB: 11-14-1940 , age 33 y.o. , MRN: [id number] , ADDRESS: [address] [place] [address] PCP Mercer Clotilda SAUNDERS, MD Patient Care Team: Mercer Clotilda SAUNDERS, MD as PCP - General (Family Medicine) Court Dorn PARAS, MD as PCP - Cardiology (Cardiology) Duffy, Macario PEDLAR, LCSW as Social Worker (Licensed Clinical Social Worker) Pa, [facility] Dolphus Reiter, MD as Consulting Physician (Rheumatology) Vincente Pfeiffer, NP (Rheumatology) Lyndal Maus, MD as Referring Physician (Pulmonary Disease) Lionell Jon DEL, [facility] (Pharmacist)  This Provider for this visit: Treatment Team:  Attending Provider: Geronimo Amel, MD    Problem List - copy and paste from [facility] - DR Maus B: 1.  Extensive bronchiectasis - cylindrical vs. Traction - has been present and not significantly changed from 2014 to 2017                       - has history of hospitalization for what sounds like pneumonia vs. Bronchiectasis flare as far back as 1999; underwent bronchoscopy which made her feel like a new woman in 1999 2.  Pulmonary fibrosis - basilar predominant reticular opacities that have not change from 2014 to 2017 and in dec 2020 CT chest 3.  Rheumatoid arthritis: RF 959 ; CCP >300                       - has been treated with methotrexate , biologics, etc in the past - none in the past several years                       - currently on 10 mg prednisone  daily    -Started nintedanib?  Approximately May 2023 but stopped September 2023 because of significant diarrhea   -Joint ILD-Pro registry 04/01/2022 3b.  Normal right heart catheterization March 2023 4.  Morbid obesity with deconditioning - has had several hospitalizations in recent months; very deconditioned at present time  - intentl wt loss +  5. COvid - Nov 2022 associated with hospitalizations for dehydratin  6. June 2023 : subsgmental PE  and  started on eliquis   -  Recent July 2024 Lower GI Bleed and resultant anemia - Compleed 13 months of eliquids for PE treatment ending August 2024 and stiopoed due to GI bleed   -d dimer 4.4 1in August 2024  7. Augu 2024/July 2024: Admission for lower GI bleed rectal bleed nadir hemoglobin 9 g% December 16, 2022    9.9 in aug 2024  11.4 at [facility] Dec 2024   06/02/2023 -   Chief Complaint  Patient presents with   Follow-up    Doing better but still feeling fatigue, breathing is same. Pt also states she feels discomfort in her chest and congestion. Using nebs and inhalers      HPI Takiyah F Genther 82 y.o. -presents for follow-up.  Presents with her son.  He is an independent historian.  She tells me that overall she is stable but the chronic cough is slightly worse.  She is not on antifibrotic she is not on Eliquis .  She is not having any bleeding issues but she states she still has stomach issues.  She is asking for GI referral.  Review of the external medical records indicated that she is a Adult Nurse GI patient last signed off by [facility] in the hospital July 2024.  She says she did not follow-up with them because the waiting time was 4 months.  I did indicate to her that all gastroenterology offices have a long waiting time.  Therefore she is okay seeing the established GI provider  GI.  I made a referral.  Regarding her cough she is open to having allergy panel testing done.  It has now been 2 years since her last CT scan of the chest and therefore she is willing to have repeat CT scan chest done.  For now the ILD appears to be stable without any antifibrotic's.  She continues on immunosuppression for her rheumatoid arthritis with Humira, prednisone  and Plaquenil  through [facility].  She is on 3 times a week azithromycin  originally given by [facility] rheumatology for her chronic cough and mucus production.  I have refilled this today.   Of note despite being on Ozempic  she has gained 20 pounds  of weight.  SYMPTOM SCALE - ILD 06/21/2019 Birth weight not on file Last Weight  Most recent update: 06/21/2019 11:53 AM    Weight  113.3 kg (249 lb 12.8 oz)             05/23/2021 224# (used to 300# some yearsaago) 07/12/2021 224# 02/04/2022 Ofev   04/01/2022 Off ofev  06/02/2023 Off ofeve  O2 use 2L nithg 3L 3L  Ra rest.  Nighttime oxygen  and daytime as needed Ra rest .night o2  Shortness of Breath 0 -> 5 scale with 5 being worst (score 6 If unable to do)       At rest 2 2 3 2  0 3  Simple tasks - showers, clothes change, eating, shaving 5 4 4 3 2 2   Household (dishes, doing bed, laundry) 4  5 4 2  0  Shopping 6  5 4 5  0  Walking level at own pace 3 4 5 5 4 3   Walking up Stairs 6  x 0 5 0  Total (30-36) Dyspnea Score 26 3 22 19 18 8   How bad is your cough? 3 3 2 5 1 2   How bad is your fatigue 0 0 4 5 4 3   How bad is nausea 0 0 0 0 0 to 4 4  How bad  is vomiting?  0 0 0 0 0 2  How bad is diarrhea? 0 0 0 5 0 0  How bad is anxiety? 2 4 3 5 4  0  How bad is depression 2 3 3 5 4  0     PFT     Latest Ref Rng & Units 05/01/2023   12:39 PM 12/22/2022   10:33 AM 02/04/2022    3:06 PM 07/12/2021    2:29 PM 01/12/2014   12:59 PM 09/12/2013   10:02 AM 04/28/2013   10:07 AM  ILD indicators  FVC-Pre L 1.70  1.49  1.47  1.29  1.59  1.78  1.73   FVC-Predicted Pre % 66  58  62  70  76  77  74   FVC-Post L     1.46  1.54  1.19   FVC-Predicted Post %     70  66  51   TLC L     3.55  3.57  2.94   TLC Predicted %     74  70  58   DLCO uncorrected ml/min/mmHg 13.36   11.92  -0.03  16.69  12.50    DLCO UNC %Pred % 71   67  0  77  51    DLCO Corrected ml/min/mmHg 13.36   11.89  -0.03      DLCO COR %Pred % 71   67  0          LAB RESULTS last 96 hours No results found.  LAB RESULTS last 90 days Recent Results (from the past 2160 hours)  HM DIABETES EYE EXAM     Status: None   Collection Time: 04/29/23  9:17 AM  Result Value Ref Range   HM Diabetic Eye Exam No Retinopathy No Retinopathy     Comment: ABSTRACTED BY HIM  Pulmonary Function Test     Status: None   Collection Time: 05/01/23 12:39 PM  Result Value Ref Range   FVC-Pre 1.70 L   FVC-%Pred-Pre 66 %   FEV1-Pre 1.44 L   FEV1-%Pred-Pre 75 %   FEV6-Pre 1.70 L   FEV6-%Pred-Pre 69 %   Pre FEV1/FVC ratio 85 %   FEV1FVC-%Pred-Pre 115 %   Pre FEV6/FVC Ratio 100 %   FEV6FVC-%Pred-Pre 105 %   FEF 25-75 Pre 2.49 L/sec   FEF2575-%Pred-Pre 183 %   DLCO unc 13.36 ml/min/mmHg   DLCO unc % pred 71 %   DLCO cor 13.36 ml/min/mmHg   DLCO cor % pred 71 %   DL/VA 6.18 ml/min/mmHg/L   DL/VA % pred 93 %  Drug Tox Alc Metab w/Con, Oral Fld     Status: None   Collection Time: 05/25/23 11:31 AM  Result Value Ref Range   Alcohol  Metabolite NEGATIVE <25 ng/mL    Comment: . For additional information, please refer to http://education.questdiagnostics.com/faq/FAQ183 (This link is being provided for informational/ educational purposes only.) . This drug testing is for medical treatment only. Analysis was performed as non-forensic testing and these results should be used only by healthcare providers to render diagnosis or treatment, or to monitor progress of medical conditions. . For assistance with interpreting these drug results, please contact a [employer] Toxicology Specialist: [phone] TOX [phone]), M-F, 8am-6pm EST. SABRA These tests were developed and their analytical performance characteristics have been determined by [facility]. They have not been cleared or approved by the FDA. These assays have been validated pursuant to the CLIA regulations and are used for clinical purposes.  Drug Tox Monitor 1 w/Conf, Oral Fld     Status: None   Collection Time: 05/25/23 11:31 AM  Result Value Ref Range   Amphetamines NEGATIVE <10 ng/mL   Barbiturates NEGATIVE <10 ng/mL   Benzodiazepines NEGATIVE <0.50 ng/mL   Buprenorphine NEGATIVE <0.10 ng/mL   Cocaine NEGATIVE <5.0 ng/mL   Fentanyl  NEGATIVE <0.10  ng/mL   Heroin Metabolite NEGATIVE <1.0 ng/mL   MARIJUANA NEGATIVE <2.5 ng/mL   MDMA NEGATIVE <10 ng/mL   Meprobamate NEGATIVE <2.5 ng/mL   Methadone NEGATIVE <5.0 ng/mL   Nicotine Metabolite NEGATIVE <5.0 ng/mL   Opiates NEGATIVE <2.5 ng/mL   Phencyclidine NEGATIVE <10 ng/mL   Tapentadol  NEGATIVE <5.0 ng/mL   Tramadol  NEGATIVE <5.0 ng/mL   Zolpidem NEGATIVE <5.0 ng/mL    Comment: . For additional information, please refer to http://education.[facility].com/faq/FAQ186 (This link is being provided for informational/ educational purposes only.) . This drug testing is for medical treatment only. Analysis was performed as non-forensic testing and these results should be used only by healthcare providers to render diagnosis or treatment, or to monitor progress of medical conditions. . For assistance with interpreting these drug results, please contact a [employer] Toxicology Specialist: [phone] TOX [phone]), M-F, 8am-6pm EST. SABRA These tests were developed and their analytical performance characteristics have been determined by [facility]. They have not been cleared or approved by the FDA. These assays have been validated pursuant to the CLIA regulations and are used for clinical purposes.          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   reports that she has never smoked. She has never used smokeless tobacco.  Past Surgical History:  Procedure Laterality Date   BRAIN SURGERY     Gamma knife 10/13. Needs repeat spring  '14   CARDIAC CATHETERIZATION N/A 10/31/2014   Procedure: Right/Left Heart Cath and Coronary Angiography;  Surgeon: Candyce GORMAN Reek, MD;  Location: [facility] INVASIVE CV LAB;  Service: Cardiovascular;  Laterality: N/A;   COLONOSCOPY WITH PROPOFOL  N/A 04/14/2022   Procedure: COLONOSCOPY WITH PROPOFOL ;  Surgeon: Albertus Gordy HERO, MD;  Location: [facility] ENDOSCOPY;  Service: Gastroenterology;  Laterality: N/A;   CYST EXCISION     2 on face   CYST REMOVAL NECK     ESOPHAGOGASTRODUODENOSCOPY (EGD) WITH PROPOFOL  N/A 09/14/2014   Procedure: ESOPHAGOGASTRODUODENOSCOPY (EGD) WITH PROPOFOL ;  Surgeon: Lamar JONETTA Aho, MD;  Location: [facility] ENDOSCOPY;  Service: Endoscopy;  Laterality: N/A;   INCISION AND DRAINAGE HIP Left 01/16/2017   Procedure: IRRIGATION AND DEBRIDEMENT LEFT HIP;  Surgeon: Vernetta Lonni GRADE, MD;  Location: [facility] ORS;  Service: Orthopedics;  Laterality: Left;   INTRAMEDULLARY (IM) NAIL INTERTROCHANTERIC Left 11/29/2016   Procedure: INTRAMEDULLARY (IM) NAIL INTERTROCHANTRIC;  Surgeon: Vernetta Lonni GRADE, MD;  Location: [facility] OR;  Service: Orthopedics;  Laterality: Left;   OVARY SURGERY     RIGHT HEART CATH N/A 08/12/2021  Procedure: RIGHT HEART CATH;  Surgeon: Court Dorn PARAS, MD;  Location: [facility] INVASIVE CV LAB;  Service: Cardiovascular;  Laterality: N/A;   SHOULDER SURGERY Left    TONSILLECTOMY  age 67   VAGINAL HYSTERECTOMY     VIDEO BRONCHOSCOPY Bilateral 05/31/2013   Procedure: VIDEO BRONCHOSCOPY WITHOUT FLUORO;  Surgeon: Dorethia Cave, MD;  Location: [facility]  ENDOSCOPY;  Service: Cardiopulmonary;  Laterality: Bilateral;   video bronscoscopy  05/19/1998   lung    Allergies  Allergen Reactions   Codeine Swelling and Other (See Comments)    Facial swelling, Chest pain, and swelling in legs    Infliximab Anaphylaxis    (REMICADE) sent me into shock    Lisinopril Swelling and Rash    Face and neck swelling    Ofev  [Nintedanib] Diarrhea    Immunization History  Administered Date(s) Administered   Fluad Quad(high Dose 65+) 02/02/2019, 03/11/2021, 04/01/2022   Fluad Trivalent(High Dose 65+) 03/02/2023   Influenza Split 01/29/2012, 03/30/2013, 02/10/2014   Influenza, High Dose Seasonal PF 02/16/2017, 02/11/2018, 01/31/2019, 02/27/2020   Influenza,inj,Quad PF,6+ Mos 02/09/2015, 12/18/2015   Influenza-Unspecified 02/09/2015, 12/18/2015   PFIZER Comirnaty(Gray Top)Covid-19 Tri-Sucrose Vaccine 07/24/2019, 08/14/2019, 02/27/2020   PFIZER(Purple Top)SARS-COV-2 Vaccination 07/17/2019, 08/14/2019   PPD Test 04/23/2015   Pneumococcal Conjugate-13 08/21/2014   Pneumococcal Polysaccharide-23 02/17/2012   Tdap 12/27/2015    Family History  Problem Relation Age of Onset   Diabetes Mother    Heart attack Mother    Hypertension Father    Lung cancer Father    Diabetes Sister    Breast cancer Sister    Breast cancer Sister    Diabetes Brother    Hypertension Brother    Heart disease Brother    Heart attack Brother    Kidney cancer Brother    Gout Brother    Kidney failure Brother        x 5   Esophageal cancer Brother    Stomach cancer Brother    Prostate cancer Brother    Uterine cancer Daughter        with mets   Fibroids Daughter    Hypertension Daughter    Hypertension Daughter    Hypertension Son    Heart murmur Son    Rheum arthritis Maternal Uncle    Alzheimer's disease Neg Hx    Dementia Neg Hx      Current Outpatient Medications:    acetaminophen  (TYLENOL ) 500 MG tablet, Take 1,000 mg by mouth as needed for moderate  pain., Disp: , Rfl:    albuterol  (PROVENTIL ) (2.5 MG/3ML) 0.083% nebulizer solution, Take 3 mLs (2.5 mg total) by nebulization every 6 (six) hours as needed for wheezing. (Patient taking differently: Take 2.5 mg by nebulization 2 (two) times daily as needed for wheezing or shortness of breath.), Disp: 75 mL, Rfl: 0   albuterol  (VENTOLIN  HFA) 108 (90 Base) MCG/ACT inhaler, Inhale 2 puffs into the lungs as needed for wheezing or shortness of breath., Disp: , Rfl:    allopurinol  (ZYLOPRIM ) 300 MG tablet, Take 1 tablet (300 mg total) by mouth daily., Disp: 30 tablet, Rfl: 0   Artificial Tear Ointment (DRY EYES OP), Place 2 drops into both eyes as needed (dry eyes)., Disp: , Rfl:    atorvastatin  (LIPITOR) 10 MG tablet, Take 1 tablet (10 mg total) by mouth daily. (Patient taking differently: Take 10 mg by mouth at bedtime.), Disp: 90 tablet, Rfl: 3   Calcium  Carb-Cholecalciferol  (CALCIUM  + D3 PO), Take 2 tablets by mouth daily., Disp: ,  Rfl:    Camphor-Menthol -Methyl Sal (SALONPAS EX), Apply 1 patch topically as needed (shoulder and leg pain)., Disp: , Rfl:    Cholecalciferol  (VITAMIN D3) 50 MCG (2000 UT) capsule, Take 2,000 Units by mouth daily., Disp: , Rfl:    colchicine  0.6 MG tablet, Take 1 tablet (0.6 mg total) by mouth daily as needed (gout flares)., Disp: 30 tablet, Rfl: 0   cyclobenzaprine  (FLEXERIL ) 5 MG tablet, Take 1 tablet (5 mg total) by mouth at bedtime as needed for muscle spasms., Disp: 90 tablet, Rfl: 2   diclofenac  Sodium (VOLTAREN ) 1 % GEL, Apply 2 g topically 3 (three) times daily. Apply to both hands and knees (Patient taking differently: Apply 1 Application topically in the morning, at noon, and at bedtime.), Disp: 100 g, Rfl: 1   donepezil  (ARICEPT ) 10 MG tablet, Take 1 tablet (10 mg total) by mouth at bedtime., Disp: 90 tablet, Rfl: 4   DULoxetine  (CYMBALTA ) 60 MG capsule, Take 1 capsule (60 mg total) by mouth daily., Disp: 90 capsule, Rfl: 2   etanercept (ENBREL SURECLICK) 50 MG/ML  injection, Inject 50 mg into the skin once a week., Disp: , Rfl:    fluticasone  (FLONASE ) 50 MCG/ACT nasal spray, Place 1 spray into both nostrils daily. (Patient taking differently: Place 2 sprays into both nostrils daily.), Disp: 16 g, Rfl: 3   furosemide  (LASIX ) 80 MG tablet, Take 1 tablet (80 mg total) by mouth daily., Disp: 90 tablet, Rfl: 3   gabapentin  (NEURONTIN ) 600 MG tablet, Take 2 tablets (1,200 mg total) by mouth 3 (three) times daily., Disp: 540 tablet, Rfl: 1   guaiFENesin  (MUCINEX ) 600 MG 12 hr tablet, Take 2 tablets (1,200 mg total) by mouth 2 (two) times daily., Disp: 120 tablet, Rfl: 0   hydrALAZINE  (APRESOLINE ) 25 MG tablet, TAKE 1 TABLET(25 MG) BY MOUTH TWICE DAILY, Disp: 180 tablet, Rfl: 2   hydroxychloroquine  (PLAQUENIL ) 200 MG tablet, Take 1 tablet (200 mg total) by mouth daily., Disp: 30 tablet, Rfl: 0   leflunomide  (ARAVA ) 20 MG tablet, Take 1 tablet (20 mg total) by mouth daily., Disp: 30 tablet, Rfl: 0   levothyroxine  (SYNTHROID ) 50 MCG tablet, Take 1 tablet (50 mcg total) by mouth daily., Disp: 90 tablet, Rfl: 3   lidocaine  (LIDODERM ) 5 %, Place 1 patch onto the skin daily. Remove & Discard patch within 12 hours or as directed by MD, Disp: 60 patch, Rfl: 11   linaclotide  (LINZESS ) 72 MCG capsule, Take 1 capsule (72 mcg total) by mouth daily before breakfast., Disp: 90 capsule, Rfl: 3   Menthol , Topical Analgesic, (BIOFREEZE ROLL-ON EX), Apply 1 Application topically as needed (pain)., Disp: , Rfl:    metolazone  (ZAROXOLYN ) 2.5 MG tablet, TAKE 1 TABLET BY MOUTH DAILY AS NEEDED FOR 2 DAYS FOR WEIGHT ABOVE 255 POUNDS, Disp: 30 tablet, Rfl: 0   metoprolol  succinate (TOPROL -XL) 100 MG 24 hr tablet, TAKE 1 TABLET BY MOUTH DAILY ALONG WITH 25 MG TABLET, Disp: 90 tablet, Rfl: 1   metoprolol  succinate (TOPROL -XL) 25 MG 24 hr tablet, Take 25 mg (1 tab) along with a 100 mg tab for a total daily dose of 125 mg. (Patient taking differently: Take 25 mg by mouth daily.), Disp: 90 tablet,  Rfl: 3   morphine  (MS CONTIN ) 15 MG 12 hr tablet, Take 1 tablet (15 mg total) by mouth every 12 (twelve) hours. Do not fill before 06/30/2023, Disp: 60 tablet, Rfl: 0   morphine  (MSIR) 15 MG tablet, Take 1 tablet (15 mg total) by  mouth 2 (two) times daily., Disp: 60 tablet, Rfl: 0   Multiple Vitamin (MULTIVITAMIN WITH MINERALS) TABS, Take 1 tablet by mouth daily. , Disp: , Rfl:    olmesartan  (BENICAR ) 40 MG tablet, Take 1 tablet (40 mg total) by mouth daily., Disp: 90 tablet, Rfl: 3   ondansetron  (ZOFRAN -ODT) 4 MG disintegrating tablet, Take 1 tablet (4 mg total) by mouth every 8 (eight) hours as needed for nausea or vomiting., Disp: 30 tablet, Rfl: 1   OXYGEN , Inhale 3 L into the lungs See admin instructions. 3 L at bedtime, and 3 L during the day as needed for exertion, Disp: , Rfl:    pantoprazole  (PROTONIX ) 40 MG tablet, Take 1 tablet (40 mg total) by mouth 2 (two) times daily. take 1 tablet by mouth once daily MINUTES BEFORE 1ST MEAL OF THE DAY, Disp: 120 tablet, Rfl: 3   Potassium Chloride  ER 20 MEQ TBCR, Take 1 tablet (20 mEq total) by mouth daily., Disp: 90 tablet, Rfl: 3   predniSONE  (DELTASONE ) 10 MG tablet, Take 1 tablet (10 mg total) by mouth daily with breakfast., Disp: 90 tablet, Rfl: 1   Semaglutide ,0.25 or 0.5MG /DOS, (OZEMPIC , 0.25 OR 0.5 MG/DOSE,) 2 MG/1.5ML SOPN, Inject 0.5 mg into the skin once a week., Disp: 4.5 mL, Rfl: 3   [START ON 06/03/2023] azithromycin  (ZITHROMAX ) 250 MG tablet, Take 1 tablet (250 mg total) by mouth every Monday, Wednesday, and Friday. Take every Monday, Wednesday, Friday., Disp: 12 tablet, Rfl: 11   doxycycline  (VIBRA -TABS) 100 MG tablet, Take 1 tablet (100 mg total) by mouth 2 (two) times daily., Disp: 14 tablet, Rfl: 0      Objective:   Vitals:   06/02/23 0913  BP: (!) 150/85  Pulse: 96  SpO2: 97%  Weight: 233 lb 6.4 oz (105.9 kg)  Height: 5' 4 (1.626 m)    Estimated body mass index is 40.06 kg/m as calculated from the following:   Height as  of this encounter: 5' 4 (1.626 m).   Weight as of this encounter: 233 lb 6.4 oz (105.9 kg).  @WEIGHTCHANGE @  [employer]   06/02/23 0913  Weight: 233 lb 6.4 oz (105.9 kg)     Physical Exam   General: No distress.  O2 at rest: no Cane present: no Sitting in wheel chair: YES Frail: no Obese: YES Neuro: Alert and Oriented x 3. GCS 15. Speech normal Psych: Pleasant Resp:  Barrel Chest - no.  Wheeze - no, Crackles -  YES BASE, No overt respiratory distress CVS: Normal heart sounds. Murmurs - no Ext: Stigmata of Connective Tissue Disease - no HEENT: Normal upper airway. PEERL +. No post nasal drip        Assessment:       ICD-10-CM   1. Interstitial lung disease due to connective tissue disease (HCC)  J84.89 CT Chest High Resolution   M35.9 Pulmonary function test    Ambulatory referral to Gastroenterology    CBC w/Diff    D-Dimer, Quantitative    2. History of pulmonary embolism  Z86.711 CT Chest High Resolution    Pulmonary function test    Ambulatory referral to Gastroenterology    CBC w/Diff    D-Dimer, Quantitative    3. History of lower GI bleeding  Z87.19 CT Chest High Resolution    Pulmonary function test    Ambulatory referral to Gastroenterology    CBC w/Diff    D-Dimer, Quantitative    4. Chronic cough  R05.3 Perennial allergen profile IgE  Plan:     Patient Instructions  ILD (interstitial lung disease) (HCC)  - clinically stable but cough worse  -worsening couhg - need to rule out allergies  Plan - check cbc with diff and RAST allergy panel 06/02/2023  = monitor purlse ox; if dropping go to ER - monitor off ofev  (due to prior hx with diarrhea) - HRCT supine n 3 months  - spiro and dlco in 3 months - RA medicines Humira, plaquenil  and prednisone  via [facility] - refill Azithromycin  3x/wekk  History of pulmonary embolism June 2023 History of Gl bleed summer 2024 with history of diverticulosos  Recent July 2024 Lower GI Bleed and  resultant anemia -> anemia has improved dec 2024 Compleed 13 months of eliquids for PE treatment ending August 2024 and stiopoed due to GI bleed  Plan  --No antifibrotic till  GI issues sorted out ad disease progresses further; (Ofev  to be listed as allergy) =check cbc and d-dimer  06/02/2023 - refer [facility] (last seen in hospital July 2024) - hold off eliquis  for now   Followup  - 3 mnths with Dr. Geronimo but after spiro/dlco  = 30 min visit; symptoms score at followup; can look at reepeat d-dime and decide on atleast low dose eliqusi at followup   FOLLOWUP No follow-ups on file.    SIGNATURE    Dr. Dorethia Geronimo, M.D., F.C.C.P,  Pulmonary and Critical Care Medicine Staff Physician, [facility] Center Director - Interstitial Lung Disease  Program  Pulmonary Fibrosis [facility] Network at [facility] [place], [place], [address]  Pager: [phone], If no answer or between  15:00h - 7:00h: call [phone] Telephone: [phone]  9:42 AM 06/02/2023

## 2023-06-02 NOTE — Patient Instructions (Addendum)
 ILD (interstitial lung disease) (HCC)  - clinically stable but cough worse  -worsening couhg - need to rule out allergies  Plan - check cbc with diff and RAST allergy panel 06/02/2023  = monitor purlse ox; if dropping go to ER - monitor off ofev  (due to prior hx with diarrhea) - HRCT supine n 3 months  - spiro and dlco in 3 months - RA medicines Humira, plaquenil  and prednisone  via [facility] - refill Azithromycin  3x/wekk  History of pulmonary embolism June 2023 History of Gl bleed summer 2024 with history of diverticulosos  Recent July 2024 Lower GI Bleed and resultant anemia -> anemia has improved dec 2024 Compleed 13 months of eliquids for PE treatment ending August 2024 and stiopoed due to GI bleed  Plan  --No antifibrotic till  GI issues sorted out ad disease progresses further; (Ofev  to be listed as allergy) =check cbc and d-dimer  06/02/2023 - refer  GI (last seen in hospital July 2024) - hold off eliquis  for now   Followup  - 3 mnths with Dr. Bertrum Brodie but after spiro/dlco  = 30 min visit; symptoms score at followup; can look at reepeat d-dime and decide on atleast low dose eliqusi at followup

## 2023-06-03 LAB — D-DIMER, QUANTITATIVE: D-Dimer, Quant: 2.18 ug{FEU}/mL — ABNORMAL HIGH (ref <0.50)

## 2023-06-05 LAB — ALLERGEN PROFILE, PERENNIAL ALLERGEN IGE
Alternaria Alternata IgE: <0.1 kU/L
Aspergillus Fumigatus IgE: <0.1 kU/L
Aureobasidi Pullulans IgE: <0.1 kU/L
Candida Albicans IgE: <0.1 kU/L
Cat Dander IgE: <0.1 kU/L
Chicken Feathers IgE: <0.1 kU/L
Cladosporium Herbarum IgE: <0.1 kU/L
Cow Dander IgE: <0.1 kU/L
D Farinae IgE: <0.1 kU/L
D Pteronyssinus IgE: <0.1 kU/L
Dog Dander IgE: <0.1 kU/L
Duck Feathers IgE: <0.1 kU/L
Goose Feathers IgE: <0.1 kU/L
Mouse Urine IgE: <0.1 kU/L
Mucor Racemosus IgE: <0.1 kU/L
Penicillium Chrysogen IgE: <0.1 kU/L
Phoma Betae IgE: <0.1 kU/L
Setomelanomma Rostrat: <0.1 kU/L
Stemphylium Herbarum IgE: <0.1 kU/L

## 2023-06-08 ENCOUNTER — Ambulatory Visit: Payer: Medicare Other | Admitting: Family Medicine

## 2023-06-10 ENCOUNTER — Other Ambulatory Visit: Payer: Self-pay

## 2023-06-10 MED ORDER — MORPHINE SULFATE ER 15 MG PO TBCR: 15.0000 mg | EXTENDED_RELEASE_TABLET | Freq: Two times a day (BID) | ORAL | 0 refills | Status: DC

## 2023-06-10 NOTE — Telephone Encounter (Signed)
Prior Rx has been called and cancelled.

## 2023-06-10 NOTE — Telephone Encounter (Signed)
Please send new Rx to [facility] on [address].

## 2023-06-10 NOTE — Telephone Encounter (Signed)
Morphine 15 MG 12hr on back order. Patient will call back with an another pharmacy of her choice.

## 2023-06-12 ENCOUNTER — Encounter: Payer: Self-pay | Admitting: Family Medicine

## 2023-06-12 ENCOUNTER — Ambulatory Visit (INDEPENDENT_AMBULATORY_CARE_PROVIDER_SITE_OTHER): Payer: Medicare Other | Admitting: Family Medicine

## 2023-06-12 VITALS — BP 142/80 | HR 106 | Temp 98.4°F | Ht 64.0 in | Wt 227.4 lb

## 2023-06-12 DIAGNOSIS — I10 Essential (primary) hypertension: Secondary | ICD-10-CM

## 2023-06-12 DIAGNOSIS — M05741 Rheumatoid arthritis with rheumatoid factor of right hand without organ or systems involvement: Secondary | ICD-10-CM

## 2023-06-12 DIAGNOSIS — K5903 Drug induced constipation: Secondary | ICD-10-CM

## 2023-06-12 DIAGNOSIS — E1140 Type 2 diabetes mellitus with diabetic neuropathy, unspecified: Secondary | ICD-10-CM

## 2023-06-12 DIAGNOSIS — K219 Gastro-esophageal reflux disease without esophagitis: Secondary | ICD-10-CM | POA: Diagnosis not present

## 2023-06-12 DIAGNOSIS — I5032 Chronic diastolic (congestive) heart failure: Secondary | ICD-10-CM

## 2023-06-12 DIAGNOSIS — M05742 Rheumatoid arthritis with rheumatoid factor of left hand without organ or systems involvement: Secondary | ICD-10-CM | POA: Diagnosis not present

## 2023-06-12 DIAGNOSIS — Z7985 Long-term (current) use of injectable non-insulin antidiabetic drugs: Secondary | ICD-10-CM | POA: Diagnosis not present

## 2023-06-12 DIAGNOSIS — J849 Interstitial pulmonary disease, unspecified: Secondary | ICD-10-CM

## 2023-06-12 MED ORDER — ONDANSETRON 4 MG PO TBDP: 4.0000 mg | ORAL_TABLET | Freq: Three times a day (TID) | ORAL | 3 refills | Status: DC | PRN

## 2023-06-12 MED ORDER — SENNOSIDES 8.6 MG PO TABS: 1.0000 | ORAL_TABLET | Freq: Every day | ORAL | 3 refills | Status: DC

## 2023-06-12 MED ORDER — PANTOPRAZOLE SODIUM 40 MG PO TBEC: 40.0000 mg | DELAYED_RELEASE_TABLET | Freq: Two times a day (BID) | ORAL | 3 refills | Status: DC

## 2023-06-12 NOTE — Progress Notes (Unsigned)
   Established Patient Office Visit   Subjective  Patient ID: [name], female    DOB: [birth year]  Age: 83 y.o. MRN: [id number]  Chief Complaint  Patient presents with  . Gastroesophageal Reflux    Abdominal pain patient is taking the zofran to help, Pain after eating with every meal, has had in the past but recently has gotten worse. Has seen the GI doctor who prescribed medication, but is still struggling.  . Shoulder Pain  Pt accompanied by her daughter Janett Billow.  HPI  {History (Optional):23778}  ROS Negative unless stated above    Objective:     BP (!) 142/80 (BP Location: Left Arm, Patient Position: Sitting, Cuff Size: Large)   Pulse (!) 106   Temp 98.4 F (36.9 C) (Oral)   Ht 5\' 4"  (1.626 m)   Wt 227 lb 6.4 oz (103.1 kg)   SpO2 (!) 88%   BMI 39.03 kg/m  {Vitals History (Optional):23777}  Physical Exam   No results found for any visits on 06/12/23.    Assessment & Plan:  There are no diagnoses linked to this encounter.  No follow-ups on file.   [name], MD

## 2023-06-13 ENCOUNTER — Inpatient Hospital Stay (HOSPITAL_COMMUNITY): Payer: Medicare Other

## 2023-06-13 ENCOUNTER — Emergency Department (HOSPITAL_COMMUNITY): Payer: Medicare Other

## 2023-06-13 ENCOUNTER — Inpatient Hospital Stay (HOSPITAL_COMMUNITY)
Admission: EM | Admit: 2023-06-13 | Discharge: 2023-06-26 | DRG: 871 | Disposition: A | Discharge status: Skilled Nursing Facility | Payer: Medicare Other | Attending: Internal Medicine | Admitting: Internal Medicine

## 2023-06-13 DIAGNOSIS — E1140 Type 2 diabetes mellitus with diabetic neuropathy, unspecified: Secondary | ICD-10-CM | POA: Diagnosis not present

## 2023-06-13 DIAGNOSIS — K567 Ileus, unspecified: Secondary | ICD-10-CM | POA: Diagnosis not present

## 2023-06-13 DIAGNOSIS — N1832 Chronic kidney disease, stage 3b: Secondary | ICD-10-CM | POA: Diagnosis present

## 2023-06-13 DIAGNOSIS — R5383 Other fatigue: Secondary | ICD-10-CM | POA: Diagnosis not present

## 2023-06-13 DIAGNOSIS — B974 Respiratory syncytial virus as the cause of diseases classified elsewhere: Secondary | ICD-10-CM | POA: Diagnosis not present

## 2023-06-13 DIAGNOSIS — F0390 Unspecified dementia without behavioral disturbance: Secondary | ICD-10-CM | POA: Diagnosis not present

## 2023-06-13 DIAGNOSIS — J1008 Influenza due to other identified influenza virus with other specified pneumonia: Secondary | ICD-10-CM | POA: Diagnosis present

## 2023-06-13 DIAGNOSIS — E119 Type 2 diabetes mellitus without complications: Secondary | ICD-10-CM

## 2023-06-13 DIAGNOSIS — N1831 Chronic kidney disease, stage 3a: Secondary | ICD-10-CM | POA: Diagnosis not present

## 2023-06-13 DIAGNOSIS — D849 Immunodeficiency, unspecified: Secondary | ICD-10-CM | POA: Diagnosis present

## 2023-06-13 DIAGNOSIS — R0902 Hypoxemia: Secondary | ICD-10-CM | POA: Diagnosis not present

## 2023-06-13 DIAGNOSIS — T50904A Poisoning by unspecified drugs, medicaments and biological substances, undetermined, initial encounter: Secondary | ICD-10-CM | POA: Diagnosis not present

## 2023-06-13 DIAGNOSIS — M069 Rheumatoid arthritis, unspecified: Secondary | ICD-10-CM | POA: Diagnosis not present

## 2023-06-13 DIAGNOSIS — G8929 Other chronic pain: Secondary | ICD-10-CM | POA: Diagnosis not present

## 2023-06-13 DIAGNOSIS — Z9981 Dependence on supplemental oxygen: Secondary | ICD-10-CM

## 2023-06-13 DIAGNOSIS — M19072 Primary osteoarthritis, left ankle and foot: Secondary | ICD-10-CM | POA: Diagnosis present

## 2023-06-13 DIAGNOSIS — R0602 Shortness of breath: Secondary | ICD-10-CM | POA: Diagnosis not present

## 2023-06-13 DIAGNOSIS — R0989 Other specified symptoms and signs involving the circulatory and respiratory systems: Secondary | ICD-10-CM | POA: Diagnosis not present

## 2023-06-13 DIAGNOSIS — R6521 Severe sepsis with septic shock: Secondary | ICD-10-CM | POA: Diagnosis present

## 2023-06-13 DIAGNOSIS — R5381 Other malaise: Secondary | ICD-10-CM | POA: Diagnosis not present

## 2023-06-13 DIAGNOSIS — J09X1 Influenza due to identified novel influenza A virus with pneumonia: Secondary | ICD-10-CM | POA: Diagnosis not present

## 2023-06-13 DIAGNOSIS — R402431 Glasgow coma scale score 3-8, in the field [EMT or ambulance]: Secondary | ICD-10-CM | POA: Diagnosis not present

## 2023-06-13 DIAGNOSIS — I10 Essential (primary) hypertension: Secondary | ICD-10-CM | POA: Diagnosis not present

## 2023-06-13 DIAGNOSIS — J111 Influenza due to unidentified influenza virus with other respiratory manifestations: Secondary | ICD-10-CM | POA: Insufficient documentation

## 2023-06-13 DIAGNOSIS — E039 Hypothyroidism, unspecified: Secondary | ICD-10-CM | POA: Diagnosis present

## 2023-06-13 DIAGNOSIS — J849 Interstitial pulmonary disease, unspecified: Secondary | ICD-10-CM | POA: Diagnosis present

## 2023-06-13 DIAGNOSIS — R578 Other shock: Secondary | ICD-10-CM | POA: Diagnosis not present

## 2023-06-13 DIAGNOSIS — R0689 Other abnormalities of breathing: Secondary | ICD-10-CM | POA: Diagnosis not present

## 2023-06-13 DIAGNOSIS — K5909 Other constipation: Secondary | ICD-10-CM | POA: Diagnosis not present

## 2023-06-13 DIAGNOSIS — Z86711 Personal history of pulmonary embolism: Secondary | ICD-10-CM

## 2023-06-13 DIAGNOSIS — R4182 Altered mental status, unspecified: Secondary | ICD-10-CM | POA: Diagnosis not present

## 2023-06-13 DIAGNOSIS — Z8249 Family history of ischemic heart disease and other diseases of the circulatory system: Secondary | ICD-10-CM

## 2023-06-13 DIAGNOSIS — J189 Pneumonia, unspecified organism: Secondary | ICD-10-CM | POA: Diagnosis not present

## 2023-06-13 DIAGNOSIS — A4189 Other specified sepsis: Principal | ICD-10-CM | POA: Diagnosis present

## 2023-06-13 DIAGNOSIS — Z885 Allergy status to narcotic agent status: Secondary | ICD-10-CM

## 2023-06-13 DIAGNOSIS — G9340 Encephalopathy, unspecified: Secondary | ICD-10-CM | POA: Diagnosis not present

## 2023-06-13 DIAGNOSIS — J440 Chronic obstructive pulmonary disease with acute lower respiratory infection: Secondary | ICD-10-CM | POA: Diagnosis present

## 2023-06-13 DIAGNOSIS — J121 Respiratory syncytial virus pneumonia: Secondary | ICD-10-CM | POA: Insufficient documentation

## 2023-06-13 DIAGNOSIS — I272 Pulmonary hypertension, unspecified: Secondary | ICD-10-CM | POA: Diagnosis present

## 2023-06-13 DIAGNOSIS — I5032 Chronic diastolic (congestive) heart failure: Secondary | ICD-10-CM | POA: Diagnosis present

## 2023-06-13 DIAGNOSIS — Z1611 Resistance to penicillins: Secondary | ICD-10-CM | POA: Diagnosis present

## 2023-06-13 DIAGNOSIS — F329 Major depressive disorder, single episode, unspecified: Secondary | ICD-10-CM | POA: Diagnosis not present

## 2023-06-13 DIAGNOSIS — B961 Klebsiella pneumoniae [K. pneumoniae] as the cause of diseases classified elsewhere: Secondary | ICD-10-CM | POA: Diagnosis present

## 2023-06-13 DIAGNOSIS — Z833 Family history of diabetes mellitus: Secondary | ICD-10-CM

## 2023-06-13 DIAGNOSIS — K5730 Diverticulosis of large intestine without perforation or abscess without bleeding: Secondary | ICD-10-CM | POA: Diagnosis present

## 2023-06-13 DIAGNOSIS — A419 Sepsis, unspecified organism: Principal | ICD-10-CM

## 2023-06-13 DIAGNOSIS — N179 Acute kidney failure, unspecified: Secondary | ICD-10-CM | POA: Diagnosis not present

## 2023-06-13 DIAGNOSIS — G4733 Obstructive sleep apnea (adult) (pediatric): Secondary | ICD-10-CM | POA: Diagnosis present

## 2023-06-13 DIAGNOSIS — D631 Anemia in chronic kidney disease: Secondary | ICD-10-CM | POA: Diagnosis not present

## 2023-06-13 DIAGNOSIS — R000 Tachycardia, unspecified: Secondary | ICD-10-CM | POA: Diagnosis not present

## 2023-06-13 DIAGNOSIS — E876 Hypokalemia: Secondary | ICD-10-CM | POA: Diagnosis not present

## 2023-06-13 DIAGNOSIS — F03A0 Unspecified dementia, mild, without behavioral disturbance, psychotic disturbance, mood disturbance, and anxiety: Secondary | ICD-10-CM | POA: Diagnosis present

## 2023-06-13 DIAGNOSIS — J84112 Idiopathic pulmonary fibrosis: Secondary | ICD-10-CM | POA: Diagnosis present

## 2023-06-13 DIAGNOSIS — J9621 Acute and chronic respiratory failure with hypoxia: Secondary | ICD-10-CM | POA: Diagnosis not present

## 2023-06-13 DIAGNOSIS — M109 Gout, unspecified: Secondary | ICD-10-CM | POA: Diagnosis not present

## 2023-06-13 DIAGNOSIS — K219 Gastro-esophageal reflux disease without esophagitis: Secondary | ICD-10-CM | POA: Diagnosis present

## 2023-06-13 DIAGNOSIS — E860 Dehydration: Secondary | ICD-10-CM | POA: Diagnosis present

## 2023-06-13 DIAGNOSIS — Z888 Allergy status to other drugs, medicaments and biological substances status: Secondary | ICD-10-CM

## 2023-06-13 DIAGNOSIS — E1122 Type 2 diabetes mellitus with diabetic chronic kidney disease: Secondary | ICD-10-CM | POA: Diagnosis not present

## 2023-06-13 DIAGNOSIS — M19071 Primary osteoarthritis, right ankle and foot: Secondary | ICD-10-CM | POA: Diagnosis present

## 2023-06-13 DIAGNOSIS — B338 Other specified viral diseases: Secondary | ICD-10-CM

## 2023-06-13 DIAGNOSIS — G894 Chronic pain syndrome: Secondary | ICD-10-CM | POA: Diagnosis present

## 2023-06-13 DIAGNOSIS — R1084 Generalized abdominal pain: Secondary | ICD-10-CM | POA: Diagnosis not present

## 2023-06-13 DIAGNOSIS — R404 Transient alteration of awareness: Secondary | ICD-10-CM | POA: Diagnosis not present

## 2023-06-13 DIAGNOSIS — R55 Syncope and collapse: Secondary | ICD-10-CM | POA: Diagnosis not present

## 2023-06-13 DIAGNOSIS — Z7401 Bed confinement status: Secondary | ICD-10-CM | POA: Diagnosis not present

## 2023-06-13 DIAGNOSIS — J9611 Chronic respiratory failure with hypoxia: Secondary | ICD-10-CM

## 2023-06-13 DIAGNOSIS — M545 Low back pain, unspecified: Secondary | ICD-10-CM

## 2023-06-13 DIAGNOSIS — R2681 Unsteadiness on feet: Secondary | ICD-10-CM | POA: Diagnosis not present

## 2023-06-13 DIAGNOSIS — E785 Hyperlipidemia, unspecified: Secondary | ICD-10-CM | POA: Diagnosis not present

## 2023-06-13 DIAGNOSIS — R5382 Chronic fatigue, unspecified: Secondary | ICD-10-CM | POA: Diagnosis present

## 2023-06-13 DIAGNOSIS — J101 Influenza due to other identified influenza virus with other respiratory manifestations: Secondary | ICD-10-CM | POA: Diagnosis not present

## 2023-06-13 DIAGNOSIS — M6281 Muscle weakness (generalized): Secondary | ICD-10-CM | POA: Diagnosis not present

## 2023-06-13 DIAGNOSIS — N390 Urinary tract infection, site not specified: Secondary | ICD-10-CM | POA: Diagnosis present

## 2023-06-13 DIAGNOSIS — E66812 Obesity, class 2: Secondary | ICD-10-CM | POA: Diagnosis present

## 2023-06-13 DIAGNOSIS — I130 Hypertensive heart and chronic kidney disease with heart failure and stage 1 through stage 4 chronic kidney disease, or unspecified chronic kidney disease: Secondary | ICD-10-CM | POA: Diagnosis not present

## 2023-06-13 DIAGNOSIS — M059 Rheumatoid arthritis with rheumatoid factor, unspecified: Secondary | ICD-10-CM | POA: Diagnosis not present

## 2023-06-13 DIAGNOSIS — Z79899 Other long term (current) drug therapy: Secondary | ICD-10-CM

## 2023-06-13 DIAGNOSIS — R0600 Dyspnea, unspecified: Secondary | ICD-10-CM | POA: Diagnosis not present

## 2023-06-13 DIAGNOSIS — Z86718 Personal history of other venous thrombosis and embolism: Secondary | ICD-10-CM

## 2023-06-13 DIAGNOSIS — M170 Bilateral primary osteoarthritis of knee: Secondary | ICD-10-CM | POA: Diagnosis present

## 2023-06-13 DIAGNOSIS — K5900 Constipation, unspecified: Secondary | ICD-10-CM | POA: Diagnosis not present

## 2023-06-13 DIAGNOSIS — K56699 Other intestinal obstruction unspecified as to partial versus complete obstruction: Secondary | ICD-10-CM | POA: Diagnosis not present

## 2023-06-13 DIAGNOSIS — J84115 Respiratory bronchiolitis interstitial lung disease: Secondary | ICD-10-CM | POA: Diagnosis not present

## 2023-06-13 DIAGNOSIS — J984 Other disorders of lung: Secondary | ICD-10-CM | POA: Diagnosis not present

## 2023-06-13 DIAGNOSIS — G9341 Metabolic encephalopathy: Secondary | ICD-10-CM | POA: Diagnosis present

## 2023-06-13 DIAGNOSIS — Z452 Encounter for adjustment and management of vascular access device: Secondary | ICD-10-CM | POA: Diagnosis not present

## 2023-06-13 DIAGNOSIS — K6389 Other specified diseases of intestine: Secondary | ICD-10-CM | POA: Diagnosis not present

## 2023-06-13 DIAGNOSIS — Z7985 Long-term (current) use of injectable non-insulin antidiabetic drugs: Secondary | ICD-10-CM

## 2023-06-13 DIAGNOSIS — Z7952 Long term (current) use of systemic steroids: Secondary | ICD-10-CM

## 2023-06-13 DIAGNOSIS — Z79890 Hormone replacement therapy (postmenopausal): Secondary | ICD-10-CM

## 2023-06-13 DIAGNOSIS — E7800 Pure hypercholesterolemia, unspecified: Secondary | ICD-10-CM | POA: Diagnosis present

## 2023-06-13 DIAGNOSIS — R918 Other nonspecific abnormal finding of lung field: Secondary | ICD-10-CM | POA: Diagnosis not present

## 2023-06-13 DIAGNOSIS — R109 Unspecified abdominal pain: Secondary | ICD-10-CM | POA: Diagnosis not present

## 2023-06-13 DIAGNOSIS — J9612 Chronic respiratory failure with hypercapnia: Secondary | ICD-10-CM

## 2023-06-13 DIAGNOSIS — E2740 Unspecified adrenocortical insufficiency: Secondary | ICD-10-CM | POA: Diagnosis not present

## 2023-06-13 HISTORY — DX: Severe sepsis with septic shock: R65.21

## 2023-06-13 HISTORY — DX: Sepsis, unspecified organism: A41.9

## 2023-06-13 LAB — I-STAT CHEM 8, ED
BUN: 17 mg/dL (ref 8–23)
Calcium, Ion: 0.96 mmol/L — ABNORMAL LOW (ref 1.15–1.40)
Chloride: 100 mmol/L (ref 98–111)
Creatinine, Ser: 1.3 mg/dL — ABNORMAL HIGH (ref 0.44–1.00)
Glucose, Bld: 109 mg/dL — ABNORMAL HIGH (ref 70–99)
HCT: 36 % (ref 36.0–46.0)
Hemoglobin: 12.2 g/dL (ref 12.0–15.0)
Potassium: 3.8 mmol/L (ref 3.5–5.1)
Sodium: 138 mmol/L (ref 135–145)
TCO2: 28 mmol/L (ref 22–32)

## 2023-06-13 LAB — URINALYSIS, W/ REFLEX TO CULTURE (INFECTION SUSPECTED)
Glucose, UA: NEGATIVE mg/dL
Hgb urine dipstick: NEGATIVE
Ketones, ur: NEGATIVE mg/dL
Nitrite: NEGATIVE
Protein, ur: 100 mg/dL — AB
Specific Gravity, Urine: 1.024 (ref 1.005–1.030)
pH: 5 (ref 5.0–8.0)

## 2023-06-13 LAB — CBC WITH DIFFERENTIAL/PLATELET
Abs Immature Granulocytes: 0.22 10*3/uL — ABNORMAL HIGH (ref 0.00–0.07)
Basophils Absolute: 0 10*3/uL (ref 0.0–0.1)
Basophils Relative: 0 %
Eosinophils Absolute: 0.1 10*3/uL (ref 0.0–0.5)
Eosinophils Relative: 1 %
HCT: 36.3 % (ref 36.0–46.0)
Hemoglobin: 10.9 g/dL — ABNORMAL LOW (ref 12.0–15.0)
Immature Granulocytes: 2 %
Lymphocytes Relative: 23 %
Lymphs Abs: 2.8 10*3/uL (ref 0.7–4.0)
MCH: 30.5 pg (ref 26.0–34.0)
MCHC: 30 g/dL (ref 30.0–36.0)
MCV: 101.7 fL — ABNORMAL HIGH (ref 80.0–100.0)
Monocytes Absolute: 1.2 10*3/uL — ABNORMAL HIGH (ref 0.1–1.0)
Monocytes Relative: 10 %
Neutro Abs: 7.9 10*3/uL — ABNORMAL HIGH (ref 1.7–7.7)
Neutrophils Relative %: 64 %
Platelets: 148 10*3/uL — ABNORMAL LOW (ref 150–400)
RBC: 3.57 MIL/uL — ABNORMAL LOW (ref 3.87–5.11)
RDW: 14.7 % (ref 11.5–15.5)
WBC: 12.3 10*3/uL — ABNORMAL HIGH (ref 4.0–10.5)
nRBC: 0.2 % (ref 0.0–0.2)

## 2023-06-13 LAB — TROPONIN I (HIGH SENSITIVITY)
Troponin I (High Sensitivity): 33 ng/L — ABNORMAL HIGH (ref <18)
Troponin I (High Sensitivity): 34 ng/L — ABNORMAL HIGH (ref <18)

## 2023-06-13 LAB — COMPREHENSIVE METABOLIC PANEL
ALT: 19 U/L (ref 0–44)
AST: 23 U/L (ref 15–41)
Albumin: 2.8 g/dL — ABNORMAL LOW (ref 3.5–5.0)
Alkaline Phosphatase: 72 U/L (ref 38–126)
Anion gap: 13 (ref 5–15)
BUN: 13 mg/dL (ref 8–23)
CO2: 26 mmol/L (ref 22–32)
Calcium: 8.2 mg/dL — ABNORMAL LOW (ref 8.9–10.3)
Chloride: 100 mmol/L (ref 98–111)
Creatinine, Ser: 1.25 mg/dL — ABNORMAL HIGH (ref 0.44–1.00)
GFR, Estimated: 43 mL/min — ABNORMAL LOW (ref >60)
Glucose, Bld: 118 mg/dL — ABNORMAL HIGH (ref 70–99)
Potassium: 3.6 mmol/L (ref 3.5–5.1)
Sodium: 139 mmol/L (ref 135–145)
Total Bilirubin: 0.4 mg/dL (ref 0.0–1.2)
Total Protein: 5.3 g/dL — ABNORMAL LOW (ref 6.5–8.1)

## 2023-06-13 LAB — I-STAT VENOUS BLOOD GAS, ED
Acid-Base Excess: 4 mmol/L — ABNORMAL HIGH (ref 0.0–2.0)
Bicarbonate: 29.7 mmol/L — ABNORMAL HIGH (ref 20.0–28.0)
Calcium, Ion: 0.98 mmol/L — ABNORMAL LOW (ref 1.15–1.40)
HCT: 35 % — ABNORMAL LOW (ref 36.0–46.0)
Hemoglobin: 11.9 g/dL — ABNORMAL LOW (ref 12.0–15.0)
O2 Saturation: 36 %
Potassium: 3.5 mmol/L (ref 3.5–5.1)
Sodium: 139 mmol/L (ref 135–145)
TCO2: 31 mmol/L (ref 22–32)
pCO2, Ven: 47.1 mm[Hg] (ref 44–60)
pH, Ven: 7.408 (ref 7.25–7.43)
pO2, Ven: 22 mm[Hg] — CL (ref 32–45)

## 2023-06-13 LAB — RAPID URINE DRUG SCREEN, HOSP PERFORMED
Amphetamines: NOT DETECTED
Barbiturates: NOT DETECTED
Benzodiazepines: NOT DETECTED
Cocaine: NOT DETECTED
Opiates: POSITIVE — AB
Tetrahydrocannabinol: NOT DETECTED

## 2023-06-13 LAB — ACETAMINOPHEN LEVEL: Acetaminophen (Tylenol), Serum: 12 ug/mL (ref 10–30)

## 2023-06-13 LAB — GLUCOSE, CAPILLARY: Glucose-Capillary: 167 mg/dL — ABNORMAL HIGH (ref 70–99)

## 2023-06-13 LAB — CBG MONITORING, ED: Glucose-Capillary: 77 mg/dL (ref 70–99)

## 2023-06-13 LAB — I-STAT CG4 LACTIC ACID, ED
Lactic Acid, Venous: 4 mmol/L (ref 0.5–1.9)
Lactic Acid, Venous: 2.1 mmol/L (ref 0.5–1.9)

## 2023-06-13 LAB — RESP PANEL BY RT-PCR (RSV, FLU A&B, COVID)  RVPGX2
Influenza A by PCR: POSITIVE — AB
Influenza B by PCR: NEGATIVE
Resp Syncytial Virus by PCR: POSITIVE — AB
SARS Coronavirus 2 by RT PCR: NEGATIVE

## 2023-06-13 LAB — CK: Total CK: 38 U/L (ref 38–234)

## 2023-06-13 LAB — ETHANOL: Alcohol, Ethyl (B): <10 mg/dL

## 2023-06-13 LAB — SALICYLATE LEVEL: Salicylate Lvl: <7 mg/dL — ABNORMAL LOW (ref 7.0–30.0)

## 2023-06-13 MED ORDER — HYDROCORTISONE SOD SUC (PF) 100 MG IJ SOLR
50.0000 mg | Freq: Four times a day (QID) | INTRAMUSCULAR | Status: DC
  Administered 2023-06-14 (×2): 50 mg via INTRAVENOUS
  Filled 2023-06-13 (×2): qty 2

## 2023-06-13 MED ORDER — PANTOPRAZOLE SODIUM 40 MG PO TBEC
40.0000 mg | DELAYED_RELEASE_TABLET | Freq: Every day | ORAL | Status: DC
  Administered 2023-06-14 – 2023-06-15 (×2): 40 mg via ORAL
  Filled 2023-06-13 (×3): qty 1

## 2023-06-13 MED ORDER — DOCUSATE SODIUM 100 MG PO CAPS: 100.0000 mg | ORAL_CAPSULE | Freq: Two times a day (BID) | ORAL | Status: DC | PRN

## 2023-06-13 MED ORDER — LACTATED RINGERS IV BOLUS: 1000.0000 mL | Freq: Once | INTRAVENOUS | Status: DC

## 2023-06-13 MED ORDER — SODIUM CHLORIDE 0.9 % IV SOLN
2.0000 g | Freq: Once | INTRAVENOUS | Status: AC
  Administered 2023-06-13: 2 g via INTRAVENOUS

## 2023-06-13 MED ORDER — POLYETHYLENE GLYCOL 3350 17 G PO PACK: 17.0000 g | PACK | Freq: Every day | ORAL | Status: DC | PRN

## 2023-06-13 MED ORDER — ACETAMINOPHEN 650 MG RE SUPP: 650.0000 mg | RECTAL | Status: DC | PRN

## 2023-06-13 MED ORDER — IPRATROPIUM-ALBUTEROL 0.5-2.5 (3) MG/3ML IN SOLN: 3.0000 mL | RESPIRATORY_TRACT | Status: DC | PRN

## 2023-06-13 MED ORDER — NOREPINEPHRINE 4 MG/250ML-% IV SOLN
2.0000 ug/min | INTRAVENOUS | Status: DC
  Administered 2023-06-13: 2 ug/min via INTRAVENOUS
  Filled 2023-06-13: qty 250

## 2023-06-13 MED ORDER — KETAMINE HCL 50 MG/5ML IJ SOSY
PREFILLED_SYRINGE | INTRAMUSCULAR | Status: AC
  Filled 2023-06-13: qty 20

## 2023-06-13 MED ORDER — ACETAMINOPHEN 325 MG PO TABS
650.0000 mg | ORAL_TABLET | ORAL | Status: DC | PRN
  Administered 2023-06-15 – 2023-06-16 (×3): 650 mg via ORAL
  Filled 2023-06-13 (×3): qty 2

## 2023-06-13 MED ORDER — LACTATED RINGERS IV BOLUS
1000.0000 mL | Freq: Once | INTRAVENOUS | Status: AC
  Administered 2023-06-13: 1000 mL via INTRAVENOUS

## 2023-06-13 MED ORDER — HEPARIN SODIUM (PORCINE) 5000 UNIT/ML IJ SOLN
5000.0000 [IU] | Freq: Three times a day (TID) | INTRAMUSCULAR | Status: DC
  Administered 2023-06-13 – 2023-06-26 (×38): 5000 [IU] via SUBCUTANEOUS
  Filled 2023-06-13 (×38): qty 1

## 2023-06-13 MED ORDER — OSELTAMIVIR PHOSPHATE 30 MG PO CAPS
30.0000 mg | ORAL_CAPSULE | Freq: Two times a day (BID) | ORAL | Status: DC
  Administered 2023-06-14 – 2023-06-17 (×7): 30 mg via ORAL
  Filled 2023-06-13 (×8): qty 1

## 2023-06-13 MED ORDER — OSELTAMIVIR PHOSPHATE 75 MG PO CAPS: 75.0000 mg | ORAL_CAPSULE | Freq: Once | ORAL | Status: DC

## 2023-06-13 MED ORDER — NALOXONE HCL 2 MG/2ML IJ SOSY
2.0000 mg | PREFILLED_SYRINGE | INTRAMUSCULAR | Status: DC | PRN
  Administered 2023-06-13: 2 mg via INTRAVENOUS
  Filled 2023-06-13: qty 2

## 2023-06-13 MED ORDER — PANTOPRAZOLE SODIUM 40 MG PO TBEC: 40.0000 mg | DELAYED_RELEASE_TABLET | Freq: Two times a day (BID) | ORAL | Status: DC

## 2023-06-13 MED ORDER — SODIUM CHLORIDE 0.9 % IV SOLN
250.0000 mL | INTRAVENOUS | Status: AC
  Administered 2023-06-13 (×2): 250 mL via INTRAVENOUS

## 2023-06-13 MED ORDER — ACETAMINOPHEN 325 MG PO TABS: 650.0000 mg | ORAL_TABLET | ORAL | Status: DC | PRN

## 2023-06-13 MED ORDER — LACTATED RINGERS IV BOLUS
500.0000 mL | Freq: Once | INTRAVENOUS | Status: AC
  Administered 2023-06-13: 500 mL via INTRAVENOUS

## 2023-06-13 MED ORDER — IPRATROPIUM-ALBUTEROL 0.5-2.5 (3) MG/3ML IN SOLN
3.0000 mL | Freq: Four times a day (QID) | RESPIRATORY_TRACT | Status: DC
Start: 2023-06-13 — End: 2023-06-14
  Administered 2023-06-14: 3 mL via RESPIRATORY_TRACT
  Filled 2023-06-13: qty 3

## 2023-06-13 MED ORDER — VASOPRESSIN 20 UNITS/100 ML INFUSION FOR SHOCK: 0.0000 [IU]/min | INTRAVENOUS | Status: DC

## 2023-06-13 MED ORDER — SODIUM CHLORIDE 0.9 % IV SOLN
2.0000 g | INTRAVENOUS | Status: AC
  Administered 2023-06-14 – 2023-06-18 (×5): 2 g via INTRAVENOUS
  Filled 2023-06-13 (×5): qty 20

## 2023-06-13 MED ORDER — OSELTAMIVIR PHOSPHATE 75 MG PO CAPS: 75.0000 mg | ORAL_CAPSULE | Freq: Two times a day (BID) | ORAL | Status: DC

## 2023-06-13 MED ORDER — HYDROCORTISONE SOD SUC (PF) 100 MG IJ SOLR
100.0000 mg | Freq: Three times a day (TID) | INTRAMUSCULAR | Status: DC
  Administered 2023-06-13: 100 mg via INTRAVENOUS
  Filled 2023-06-13: qty 2

## 2023-06-13 MED ORDER — PHENTOLAMINE MESYLATE 5 MG IJ SOLR
5.0000 mg | Freq: Once | INTRAMUSCULAR | Status: AC
  Administered 2023-06-13: 5 mg via SUBCUTANEOUS
  Filled 2023-06-13: qty 5

## 2023-06-13 MED ORDER — CALCIUM GLUCONATE-NACL 2-0.675 GM/100ML-% IV SOLN
2.0000 g | Freq: Once | INTRAVENOUS | Status: AC
  Administered 2023-06-13: 2000 mg via INTRAVENOUS
  Filled 2023-06-13: qty 100

## 2023-06-13 MED ORDER — ACETAMINOPHEN 650 MG RE SUPP
650.0000 mg | Freq: Once | RECTAL | Status: AC
Start: 2023-06-13 — End: 2023-06-13
  Administered 2023-06-13: 650 mg via RECTAL

## 2023-06-13 MED ORDER — INSULIN ASPART 100 UNIT/ML IJ SOLN
0.0000 [IU] | INTRAMUSCULAR | Status: DC
Start: 2023-06-13 — End: 2023-06-15
  Administered 2023-06-14: 1 [IU] via SUBCUTANEOUS

## 2023-06-13 MED ORDER — SODIUM CHLORIDE 0.9 % IV SOLN
500.0000 mg | INTRAVENOUS | Status: AC
  Administered 2023-06-13 – 2023-06-15 (×3): 500 mg via INTRAVENOUS
  Filled 2023-06-13 (×3): qty 5

## 2023-06-13 MED ORDER — NOREPINEPHRINE 4 MG/250ML-% IV SOLN
0.0000 ug/min | INTRAVENOUS | Status: DC
  Administered 2023-06-13: 26 ug/min via INTRAVENOUS
  Filled 2023-06-13: qty 250

## 2023-06-13 NOTE — Progress Notes (Signed)
eLink Physician-Brief Progress Note Patient Name: [name] DOB: Mar 24, 1941 MRN: [id number]   Date of Service  06/13/2023  HPI/Events of Note  83 year old female immunocompromised on chronic steroids who presented with altered mental status, septic shock which appears to be from a left lower lobe pneumonia and pyuria.  eICU Interventions  Patient in ICU being managed by in-house intensivist.  She is on broad-spectrum antimicrobials, has received IV fluid resuscitation, is on stress dose steroids and on pressors. Management per ground team.  Will be available if needed.     Intervention Category Evaluation Type: New Patient Evaluation  [name] 06/13/2023, 10:31 PM

## 2023-06-13 NOTE — Procedures (Signed)
Arterial Catheter Insertion Procedure Note  [name]  [id number]  [birth year]  Date:06/13/23  Time:10:58 PM    Provider Performing: Lidia Collum    Procedure: Insertion of Arterial Line (78295) with US guidance (62130)   Indication(s) Blood pressure monitoring and/or need for frequent ABGs  Consent Unable to obtain consent due to emergent nature of procedure.  Anesthesia None   Time Out Verified patient identification, verified procedure, site/side was marked, verified correct patient position, special equipment/implants available, medications/allergies/relevant history reviewed, required imaging and test results available.   Sterile Technique Maximal sterile technique including full sterile barrier drape, hand hygiene, sterile gown, sterile gloves, mask, hair covering, sterile ultrasound probe cover (if used).   Procedure Description Area of catheter insertion was cleaned with chlorhexidine and draped in sterile fashion. With real-time ultrasound guidance an arterial catheter was placed into the left radial artery.  Appropriate arterial tracings confirmed on monitor.     Complications/Tolerance None; patient tolerated the procedure well.   EBL Minimal   Specimen(s) None  [name] [facility] 06/13/2023, 10:59 PM  Please see [email or web address] for pager details.  From 7A-7P if no response, please call [phone]. After hours, please call ELink [phone].

## 2023-06-13 NOTE — ED Triage Notes (Signed)
Pt bib ems from home c/o drug overdose Morphine. Pt manages her medication and has two bottles of morphine. Pt was noted to be lethargic all day and then u/t respond. Pt pupils pinpoint 80/40. Pt has been unwell since Thursday. Pt has a cough with rhonchi.   RA 88%  RR 16  CBG 108 Pt received  two 1mg  Narcan GCS 4 to 8. BP 90/40 HR 120   18g

## 2023-06-13 NOTE — Progress Notes (Signed)
Post arterial line insertion, patients blood pressures were reading systolic 200s. Titrated levophed down aggressively per CCM whom is present in the room. Cuff pressures believed to be inaccurate. Not starting vaso at this time.   [name]

## 2023-06-13 NOTE — ED Provider Notes (Signed)
[place] EMERGENCY DEPARTMENT AT [facility] Provider Note   CSN: [id number] Arrival date & time: 06/13/23  1642     History {Add pertinent medical, surgical, social history, OB history to HPI:1} Chief Complaint  Patient presents with  . Drug Overdose    [name] is a 83 y.o. female.  HPI    83 year old female comes in with chief complaint of acute mental status change. Patient has history of hypertension, hyperlipidemia, rheumatoid arthritis, chronic pain syndrome on morphine, diabetes, chronic respiratory failure with as needed oxygen, CHF, DVT/PE currently not on any anticoagulation.  Per EMS, family last saw patient normal on Thursday.  Patient lives with her husband and managing his his medications -as the husband has dementia.  They found patient lethargic this morning and her symptoms are worsened.  When EMS arrived, her GCS was 4 and she received 1 mg of Narcan.  Her GCS improved to 8 and has remained stable over there.  They gave 1 more Narcan because of pinpoint pupils, but there was no improvement in her mental status.  Family unsure if patient took too much of morphine.  Subsequently, I called patient's daughter.  She states that she last saw patient on Thursday and she was well.  Yesterday patient was taken to her PCP by another sister, and patient was complaining of weakness.  Today she went to check on her at 10 AM.  Patient was fatigued, weak but she was able to respond and they were able to encourage her to take some fluid.  Patient became more alert.  Daughter left at 2 PM, and then she received a message that patient had worsened again.  Home Medications Prior to Admission medications   Medication Sig Start Date End Date Taking? Authorizing Provider  acetaminophen (TYLENOL) 500 MG tablet Take 1,000 mg by mouth as needed for moderate pain.    [provider]  albuterol (PROVENTIL) (2.5 MG/3ML) 0.083% nebulizer solution Take 3 mLs (2.5 mg  total) by nebulization every 6 (six) hours as needed for wheezing. Patient taking differently: Take 2.5 mg by nebulization 2 (two) times daily as needed for wheezing or shortness of breath. 05/03/21   Medina-Vargas, Monina C, NP  albuterol (VENTOLIN HFA) 108 (90 Base) MCG/ACT inhaler Inhale 2 puffs into the lungs as needed for wheezing or shortness of breath.    [provider]  allopurinol (ZYLOPRIM) 300 MG tablet Take 1 tablet (300 mg total) by mouth daily. 05/03/21   Medina-Vargas, Monina C, NP  Artificial Tear Ointment (DRY EYES OP) Place 2 drops into both eyes as needed (dry eyes).    [provider]  atorvastatin (LIPITOR) 10 MG tablet Take 1 tablet (10 mg total) by mouth daily. Patient taking differently: Take 10 mg by mouth at bedtime. 08/14/22   [name], MD  azithromycin (ZITHROMAX) 250 MG tablet Take 1 tablet (250 mg total) by mouth every Monday, Wednesday, and Friday. Take every Monday, Wednesday, Friday. 06/03/23 06/02/24  Kalman Shan, MD  Calcium Carb-Cholecalciferol (CALCIUM + D3 PO) Take 2 tablets by mouth daily.    [provider]  Camphor-Menthol-Methyl Sal (SALONPAS EX) Apply 1 patch topically as needed (shoulder and leg pain).    [provider]  Cholecalciferol (VITAMIN D3) 50 MCG (2000 UT) capsule Take 2,000 Units by mouth daily.    [provider]  colchicine 0.6 MG tablet Take 1 tablet (0.6 mg total) by mouth daily as needed (gout flares). 05/03/21   Medina-Vargas, Monina  C, NP  cyclobenzaprine (FLEXERIL) 5 MG tablet Take 1 tablet (5 mg total) by mouth at bedtime as needed for muscle spasms. 11/27/22   Jones Bales, NP  diclofenac Sodium (VOLTAREN) 1 % GEL Apply 2 g topically 3 (three) times daily. Apply to both hands and knees Patient taking differently: Apply 1 Application topically in the morning, at noon, and at bedtime. 05/03/21   Medina-Vargas, Monina C, NP  donepezil (ARICEPT) 10 MG tablet Take 1 tablet (10 mg  total) by mouth at bedtime. 09/08/22   Anson Fret, MD  doxycycline (VIBRA-TABS) 100 MG tablet Take 1 tablet (100 mg total) by mouth 2 (two) times daily. 04/09/23   Kalman Shan, MD  DULoxetine (CYMBALTA) 60 MG capsule Take 1 capsule (60 mg total) by mouth daily. 11/27/22   Jones Bales, NP  etanercept (ENBREL SURECLICK) 50 MG/ML injection Inject 50 mg into the skin once a week.    [provider]  fluticasone (FLONASE) 50 MCG/ACT nasal spray Place 1 spray into both nostrils daily. Patient taking differently: Place 2 sprays into both nostrils daily. 11/28/22   [name], MD  furosemide (LASIX) 80 MG tablet Take 1 tablet (80 mg total) by mouth daily. 05/15/23   Runell Gess, MD  gabapentin (NEURONTIN) 600 MG tablet Take 2 tablets (1,200 mg total) by mouth 3 (three) times daily. 05/25/23   Jones Bales, NP  guaiFENesin (MUCINEX) 600 MG 12 hr tablet Take 2 tablets (1,200 mg total) by mouth 2 (two) times daily. 05/03/21   Medina-Vargas, Monina C, NP  hydrALAZINE (APRESOLINE) 25 MG tablet TAKE 1 TABLET(25 MG) BY MOUTH TWICE DAILY 02/13/23   Runell Gess, MD  hydroxychloroquine (PLAQUENIL) 200 MG tablet Take 1 tablet (200 mg total) by mouth daily. 05/03/21   Medina-Vargas, Monina C, NP  leflunomide (ARAVA) 20 MG tablet Take 1 tablet (20 mg total) by mouth daily. 05/03/21 08/09/23  Medina-Vargas, Monina C, NP  levothyroxine (SYNTHROID) 50 MCG tablet Take 1 tablet (50 mcg total) by mouth daily. 11/28/22   [name], MD  lidocaine (LIDODERM) 5 % Place 1 patch onto the skin daily. Remove & Discard patch within 12 hours or as directed by MD 12/24/22   [name], MD  linaclotide [facility]) 72 MCG capsule Take 1 capsule (72 mcg total) by mouth daily before breakfast. 08/14/22   [name], MD  Menthol, Topical Analgesic, (BIOFREEZE ROLL-ON EX) Apply 1 Application topically as needed (pain).    [provider]  metolazone (ZAROXOLYN) 2.5 MG tablet TAKE 1  TABLET BY MOUTH DAILY AS NEEDED FOR 2 DAYS FOR WEIGHT ABOVE 255 POUNDS 05/03/21   Medina-Vargas, Monina C, NP  metoprolol succinate (TOPROL-XL) 100 MG 24 hr tablet TAKE 1 TABLET BY MOUTH DAILY ALONG WITH 25 MG TABLET 03/04/23   Runell Gess, MD  metoprolol succinate (TOPROL-XL) 25 MG 24 hr tablet Take 25 mg (1 tab) along with a 100 mg tab for a total daily dose of 125 mg. Patient taking differently: Take 25 mg by mouth daily. 11/28/22   [name], MD  morphine (MS CONTIN) 15 MG 12 hr tablet Take 1 tablet (15 mg total) by mouth every 12 (twelve) hours. 06/10/23   Jones Bales, NP  morphine (MSIR) 15 MG tablet Take 1 tablet (15 mg total) by mouth 2 (two) times daily. 05/25/23   Jones Bales, NP  Multiple Vitamin (MULTIVITAMIN WITH MINERALS) TABS Take 1 tablet by mouth daily.  [provider]  olmesartan (BENICAR) 40 MG tablet Take 1 tablet (40 mg total) by mouth daily. 11/28/22   [name], MD  ondansetron (ZOFRAN-ODT) 4 MG disintegrating tablet Take 1 tablet (4 mg total) by mouth every 8 (eight) hours as needed for nausea or vomiting. 06/12/23   [name], MD  OXYGEN Inhale 3 L into the lungs See admin instructions. 3 L at bedtime, and 3 L during the day as needed for exertion    [provider]  pantoprazole (PROTONIX) 40 MG tablet Take 1 tablet (40 mg total) by mouth 2 (two) times daily. take 1 tablet by mouth once daily MINUTES BEFORE 1ST MEAL OF THE DAY 06/12/23   [name], MD  Potassium Chloride ER 20 MEQ TBCR Take 1 tablet (20 mEq total) by mouth daily. 11/28/22   [name], MD  predniSONE (DELTASONE) 10 MG tablet Take 1 tablet (10 mg total) by mouth daily with breakfast. 05/22/21   [name], MD  Semaglutide,0.25 or 0.5MG /DOS, (OZEMPIC, 0.25 OR 0.5 MG/DOSE,) 2 MG/1.5ML SOPN Inject 0.5 mg into the skin once a week. 08/19/22   [name], MD  senna (SENOKOT) 8.6 MG tablet Take 1 tablet (8.6 mg total) by mouth daily. 06/12/23    [name], MD      Allergies    Codeine, Infliximab, Lisinopril, and Ofev [nintedanib]    Review of Systems   Review of Systems  Physical Exam Updated Vital Signs BP (!) 76/46 (BP Location: Right Arm)   Pulse (!) 120   Temp 97.9 F (36.6 C) (Oral)   Resp (!) 30   SpO2 93%  Physical Exam Vitals and nursing note reviewed.  Constitutional:      Comments: Comatose/unresponsive  Eyes:     Comments: Pupils are 3 mm, equal no nystagmus  Cardiovascular:     Rate and Rhythm: Tachycardia present.  Pulmonary:     Comments: Mild tachypnea Abdominal:     Palpations: Abdomen is soft.     Tenderness: There is abdominal tenderness. There is no guarding.     Comments: Mild discomfort in the lower quadrant  Musculoskeletal:     Cervical back: Neck supple.  Neurological:     Mental Status: She is unresponsive.     GCS: GCS eye subscore is 1. GCS verbal subscore is 2. GCS motor subscore is 5.     Comments: Moving all 4 extremities to noxious stimuli Positive gag reflex    ED Results / Procedures / Treatments   Labs (all labs ordered are listed, but only abnormal results are displayed) Labs Reviewed  RESP PANEL BY RT-PCR (RSV, FLU A&B, COVID)  RVPGX2 - Abnormal; Notable for the following components:      Result Value   Influenza A by PCR POSITIVE (*)    Resp Syncytial Virus by PCR POSITIVE (*)    All other components within normal limits  COMPREHENSIVE METABOLIC PANEL - Abnormal; Notable for the following components:   Glucose, Bld 118 (*)    Creatinine, Ser 1.25 (*)    Calcium 8.2 (*)    Total Protein 5.3 (*)    Albumin 2.8 (*)    GFR, Estimated 43 (*)    All other components within normal limits  CBC WITH DIFFERENTIAL/PLATELET - Abnormal; Notable for the following components:   WBC 12.3 (*)    RBC 3.57 (*)    Hemoglobin 10.9 (*)    MCV 101.7 (*)    Platelets  148 (*)    Neutro Abs 7.9 (*)    Monocytes Absolute 1.2 (*)    Abs Immature Granulocytes 0.22 (*)    All  other components within normal limits  URINALYSIS, W/ REFLEX TO CULTURE (INFECTION SUSPECTED) - Abnormal; Notable for the following components:   Color, Urine AMBER (*)    APPearance CLOUDY (*)    Bilirubin Urine MODERATE (*)    Protein, ur 100 (*)    Leukocytes,Ua SMALL (*)    Bacteria, UA MANY (*)    All other components within normal limits  RAPID URINE DRUG SCREEN, HOSP PERFORMED - Abnormal; Notable for the following components:   Opiates POSITIVE (*)    All other components within normal limits  SALICYLATE LEVEL - Abnormal; Notable for the following components:   Salicylate Lvl <7.0 (*)    All other components within normal limits  I-STAT VENOUS BLOOD GAS, ED - Abnormal; Notable for the following components:   pO2, Ven 22 (*)    Bicarbonate 29.7 (*)    Acid-Base Excess 4.0 (*)    Calcium, Ion 0.98 (*)    HCT 35.0 (*)    Hemoglobin 11.9 (*)    All other components within normal limits  I-STAT CHEM 8, ED - Abnormal; Notable for the following components:   Creatinine, Ser 1.30 (*)    Glucose, Bld 109 (*)    Calcium, Ion 0.96 (*)    All other components within normal limits  I-STAT CG4 LACTIC ACID, ED - Abnormal; Notable for the following components:   Lactic Acid, Venous 4.0 (*)    All other components within normal limits  TROPONIN I (HIGH SENSITIVITY) - Abnormal; Notable for the following components:   Troponin I (High Sensitivity) 33 (*)    All other components within normal limits  URINE CULTURE  CULTURE, BLOOD (ROUTINE X 2)  CULTURE, BLOOD (ROUTINE X 2)  ACETAMINOPHEN LEVEL  ETHANOL  CK  PROTIME-INR  APTT  CBG MONITORING, ED  I-STAT CG4 LACTIC ACID, ED  TROPONIN I (HIGH SENSITIVITY)    EKG EKG Interpretation Date/Time:  Saturday June 13 2023 16:52:52 EST Ventricular Rate:  122 PR Interval:  134 QRS Duration:  85 QT Interval:  327 QTC Calculation: 466 R Axis:   -51  Text Interpretation: Sinus tachycardia Left anterior fascicular block Low voltage,  precordial leads Abnormal R-wave progression, late transition LVH by voltage No acute changes besides tachycardia Confirmed by Derwood Kaplan [phone]) on 06/13/2023 4:55:45 PM  Radiology DG Chest Port 1 View Result Date: 06/13/2023 CLINICAL DATA:  Drug overdose and lethargy EXAM: PORTABLE CHEST 1 VIEW COMPARISON:  Chest radiograph dated 02/02/2022 FINDINGS: Patient is rotated to the right. Mildly low lung volumes. Diffuse patchy left lower lung opacity. Minimal right basilar linear opacity. No focal consolidations. No pleural effusion or pneumothorax. Enlarged cardiomediastinal silhouette is likely projectional. No acute osseous abnormality. IMPRESSION: 1. Diffuse patchy left lower lung opacity, suspicious for aspiration. 2. Minimal right basilar linear opacity, likely atelectasis. Electronically Signed   By: Agustin Cree M.D.   On: 06/13/2023 18:55    Procedures Procedures  {Document cardiac monitor, telemetry assessment procedure when appropriate:1}  Medications Ordered in ED Medications  naloxone [facility]) injection 2 mg (2 mg Intravenous Given 06/13/23 1655)  lactated ringers bolus 1,000 mL (1,000 mLs Intravenous New Bag/Given 06/13/23 1713)  cefTRIAXone (ROCEPHIN) 2 g in sodium chloride 0.9 % 100 mL IVPB (2 g Intravenous New Bag/Given 06/13/23 1808)  lactated ringers bolus 1,000 mL (1,000 mLs Intravenous New  Bag/Given 06/13/23 1800)    ED Course/ Medical Decision Making/ A&P Clinical Course as of 06/13/23 1908  Sat Jun 13, 2023  1905 Resp panel by RT-PCR (RSV, Flu A&B, Covid) Urine, Catheterized(!) Patient's respiratory panel is positive for flu and RSV. Her BP improved after first liter of IV fluid.  She is now hypotensive again.  Second bolus has been ordered.  Patient's BMI is 39.  Her height is 5 4.  Her ideal body weight is 55 kg.  We will give her another bolus, that should get [place] to 30 cc per ideal body weight in kilogram for fluid resuscitation.  I think additional fluid will be  detrimental.  We will start peripheral pressors. [AN]  1906 CT Head Wo Contrast CT scan of the brain was independently interpreted.  There is no evidence of brain bleed. [AN]  1906 DG Chest Port 1 View X-ray of the chest also independently interpreted.  There is clear evidence of left lower lobe opacity.  Concerns for aspiration. [AN]  1906 I reassessed the patient.  Her mental status is unchanged.  She remains a GCS of 8.  She continues to have secretions and cough, requiring assistance with clearing the phlegm.  I discussed with the family my concerns for further aspiration and further complication from it.  When I first saw the patient, was already made aware that she is full code.  While the diagnostic workup was ongoing, we were aggressive in managing her, with the thought of noticing improvement.  We have not seen any significant improvement here and I have recommended that we proceed with intubation at this time.  Daughter is on board.  For induction we will give her ketamine given her interstitial lung disease and theoretical benefit of bronchodilation in the setting of patient having RSV. [AN]    Clinical Course User Index [AN] Derwood Kaplan, MD   {   Click here for ABCD2, HEART and other calculatorsREFRESH Note before signing :1}                              Medical Decision Making Amount and/or Complexity of Data Reviewed Labs: ordered. Decision-making details documented in ED Course. Radiology: ordered. Decision-making details documented in ED Course.  Risk Prescription drug management. Decision regarding hospitalization.   83 year old patient with pertinent past medical history of pe/dvt, chf, metabolic syndrome, chronic pain syndrome on morphine comes in with chief complaint of acute altered mental status change.  Collateral history provided by EMS and patient's daughter.  I also reviewed patient's records including her medications.   Differential diagnosis considered  for this patient includes: ICH / Stroke, acute coronary syndrome, Infection - UTI/Pneumonia/soft tissue infection leading to encephalopathy, encephalopathy due to electrolyte abnormality or drug interactions or toxins or metabolic conditions like adrenal insufficiency, hyperglycemia, paraneoplastic process, Hypercapnia / COPD, Hypoxia  Based on initial assessment, higher suspicion for polypharmacy, metabolic encephalopathy, infection, brain bleed, hypercapnia and stroke  Initial history indicated last known normal Thursday.  It appears that patient was seen this morning, and although she was weaker, there was bigger degree of change noted after last known normal of 2 PM.  Plan is to get basic labs and CT scan of the brain.  Patient noted to be hypotensive as well.  1 L of IV fluid ordered.  I requested we put in a Foley catheter and a code medical has been called.  Reassessment: I have independently  reviewed the following labs: Initial lactic acid is 4, blood gas reveals no evidence of respiratory acidosis or hypercapnia.  Patient's creatinine is also close to baseline of 1.3.  UA concerning, many bacteria noted along with pyuria.  I have independently reviewed the following imaging: X-ray of the chest, she has left sided opacity in the lower lung field.  Patient was also reassessed at 6:15 PM.  Patient now opens her eye to noxious stimuli, but still unable to communicate.  She has also spiked a low-grade fever. Higher concerns for infection being the cause at this time.  We have given her IV Rocephin and we will add doxycycline.  Final Clinical Impression(s) / ED Diagnoses Final diagnoses:  Septic shock (HCC)    Rx / DC Orders ED Discharge Orders     None

## 2023-06-13 NOTE — ED Notes (Signed)
Pt CBG was 77

## 2023-06-13 NOTE — ED Notes (Signed)
Rt forearm IV infiltrated with levophed running. Pharmacy notified. IV removed.

## 2023-06-13 NOTE — Sepsis Progress Note (Signed)
Sepsis protocol is being followed by eLink.

## 2023-06-13 NOTE — H&P (Signed)
NAME:  [name], MRN:  [id number], DOB:  January 20, 1941, LOS: 0 ADMISSION DATE:  06/13/2023, CONSULTATION DATE:  1/25 REFERRING MD:  Dr. Rhunette Croft, CHIEF COMPLAINT:  resp failure; shock   History of Present Illness:  Patient is a 83 year old female with pertinent PMH DMT2, HTN, chronic pain syndrome on morphine, RA on chronic steroids, ILD, bronchiectasis, chronic respiratory failure on O2, CKD 3, chronic diastolic HF, PE/DVT on Eliquis, hypothyroidism presents to [facility] ED on 1/25 with AMS.  Patient lives with husband who has dementia.  Patient's LKN 1/23.  On 1/25 family found patient to be lethargic in the morning and symptoms worsened.  EMS called and GCS 4 given 1 mg of Narcan.  GCS improved to 8.  Gave another 1 mg of Narcan.  On arrival GCS still 8 and has a gag reflex.  Noted to be hypotensive 76/46 and tachycardia 120s.  Tmax 100.6 F and WBC 12.3.  Given IV fluids and cultures obtained.  Started on Rocephin.  Found to be positive for flu A and RSV.  CXR with diffuse patchy LLL opacity suspicious for aspiration and minimal right base opacity.  Sats 93% on 3L [place].  VBG 7.4, 47, 22, 29.  CBG 109.  CT head no acute abnormality.  Ethanol and salicylate level WNL.  Acetaminophen level 12.  UDS positive for opiates.  UA with small leukocytes.  Despite IV fluids patient remains hypotensive and was started on levo.  PCCM consulted for ICU admission.   Pertinent  Medical History   Past Medical History:  Diagnosis Date   Acute on chronic diastolic (congestive) heart failure (HCC)    Anemia    iron deficiency anemia - secondary to blood loss ( chronic)    Anxiety    Arthritis    endstage changes bilateral knees/bilateral ankles.    Asthma    Carotid artery occlusion    Chronic fatigue    Chronic kidney disease    Closed left hip fracture (HCC)    Clotting disorder (HCC)    pt denies this   Contusion of left knee    due to fall 1/14.   COPD (chronic obstructive pulmonary disease) (HCC)     pulmonary fibrosis   Depression, reactive    Diabetes mellitus    type II    Diastolic dysfunction    Difficulty in walking    Family history of heart disease    Generalized muscle weakness    Gout    High cholesterol    History of falling    Hypertension    Hypothyroidism    Interstitial lung disease (HCC)    Meningioma of left sphenoid wing involving cavernous sinus (HCC) 02/17/2012   Continue diplopia, left eye pain and left headaches.     Morbid obesity (HCC)    Neuromuscular disorder (HCC)    diabetic neuropathy    Normal coronary arteries    cardiac catheterization performed  10/31/14   Pneumonia    RA (rheumatoid arthritis) (HCC)    has been off methotreaxte since 10/13.   Spinal stenosis of lumbar region    Thyroid disease    Unspecified lack of coordination    URI (upper respiratory infection)      Significant Hospital Events: Including procedures, antibiotic start and stop dates in addition to other pertinent events   1/25 admitted to [facility] with AMS, shock on levo; PCCM consulted  Interim History / Subjective:  See above  Objective   Blood pressure (!) 66/29, pulse [name])  107, temperature (!) 100.6 F (38.1 C), resp. rate (!) 23, SpO2 97%.        Intake/Output Summary (Last 24 hours) at 06/13/2023 2001 Last data filed at 06/13/2023 1920 Gross per 24 hour  Intake 2100 ml  Output --  Net 2100 ml   There were no vitals filed for this visit.  Examination: General:  elderly female in NAD HEENT: MM pink/dry; [place] in place Neuro: lethargic but arousable; able to state name and follow some commands CV: s1s2, tachy 110s, no m/r/g PULM:  dim BS bilaterally; [place] 3L [place] sats 95% GI: soft, bsx4 active  Extremities: warm/dry, no edema  Skin: no rashes or lesions appreciated   Resolved Hospital Problem list     Assessment & Plan:   Septic shock secondary to flu a/rsv pneumonia: also small leukocytes in UA (UTI?) Plan: -Levo for MAP goal greater than 65 -will  need cvl -Given IV fluids -Trend LA and trop -Continue Tamiflu, Rocephin, azithromycin -Obtain cortisol and start on stress dose steroids -Follow cultures  Chronic respiratory failure with hypoxia: 3L [place] at home Influenza A/RSV pneumonia Hx of rheumatoid arthritis/ ILD (interstitial lung disease)/  Pulmonary fibrosis -followed by Dr. Marchelle Gearing Plan: -cont 3L [place] and wean for sats >92% -Tamiflu  -Start on Rocephin/azithromycin for possible CAP -PCT, MRSA PCR, urine Legionella/strep -IV steroids -duoneb prn -pulm toilet -hold etanercept, plaquenil, leflunomide  Acute encephalopathy: history of chronic pain syndrome on morphine and some improvement in GCS with Narcan; also w/ septic shock and pna -CT head no acute abnormality Plan: -Limit sedating meds -Check ammonia and TSH -Treat for sepsis as above -hold flexeril, cymbalta, gabapentin, morphine   T2DM Plan: -SSI and CBG monitoring -A1c  Chronic diastolic CHF HTN HLD Pulmonary HTN Last 2D echo in 2023 showed an EF of 55% with grade 1 diastolic dysfunction Plan: -Hold home antihypertensives while in shock -Daily weights; strict I/os -statin when able to take po   AKI on CKD stage IIIa  Plan: -Trend BMP / urinary output -Replace electrolytes as indicated -Avoid nephrotoxic agents, ensure adequate renal perfusion  History of DVT/PE: -Currently not on AC Plan: -Telemetry monitoring  Chronic anemia Plan: -trend cbc  Hypothyroidism Plan: -tsh -resume synthroid when able to take po  Hx of dementia? Plan: -resume aricept when able to take po  Best Practice (right click and "Reselect all SmartList Selections" daily)   Diet/type: NPO DVT prophylaxis prophylactic heparin  Pressure ulcer(s): N/A GI prophylaxis: N/A Lines: N/A; will need CVL Foley:  Yes, and it is still needed Code Status:  full code Last date of multidisciplinary goals of care discussion [1/25 daughter at bedside. State she has living  will and would want everything done to save her life. She would not want to be on ventilator long term.]  Labs   CBC: Recent Labs  Lab 06/13/23 1714 06/13/23 1734 06/13/23 1740  WBC 12.3*  --   --   NEUTROABS 7.9*  --   --   HGB 10.9* 11.9* 12.2  HCT 36.3 35.0* 36.0  MCV 101.7*  --   --   PLT 148*  --   --     Basic Metabolic Panel: Recent Labs  Lab 06/13/23 1714 06/13/23 1734 06/13/23 1740  NA 139 139 138  K 3.6 3.5 3.8  CL 100  --  100  CO2 26  --   --   GLUCOSE 118*  --  109*  BUN 13  --  17  CREATININE 1.25*  --  1.30*  CALCIUM 8.2*  --   --    GFR: Estimated Creatinine Clearance: 39.7 mL/min (A) (by C-G formula based on SCr of 1.3 mg/dL (H)). Recent Labs  Lab 06/13/23 1714 06/13/23 1735 06/13/23 1939  WBC 12.3*  --   --   LATICACIDVEN  --  4.0* 2.1*    Liver Function Tests: Recent Labs  Lab 06/13/23 1714  AST 23  ALT 19  ALKPHOS 72  BILITOT 0.4  PROT 5.3*  ALBUMIN 2.8*   No results for input(s): "LIPASE", "AMYLASE" in the last 168 hours. No results for input(s): "AMMONIA" in the last 168 hours.  ABG    Component Value Date/Time   PHART 7.460 (H) 03/30/2021 1638   PCO2ART 46.6 03/30/2021 1638   PO2ART 70 (L) 03/30/2021 1638   HCO3 29.7 (H) 06/13/2023 1734   TCO2 28 06/13/2023 1740   O2SAT 36 06/13/2023 1734     Coagulation Profile: No results for input(s): "INR", "PROTIME" in the last 168 hours.  Cardiac Enzymes: Recent Labs  Lab 06/13/23 1714  CKTOTAL 38    HbA1C: Hgb A1c MFr Bld  Date/Time Value Ref Range Status  12/16/2022 09:43 AM 5.5 4.8 - 5.6 % Final    Comment:    (NOTE)         Prediabetes: 5.7 - 6.4         Diabetes: >6.4         Glycemic control for adults with diabetes: <7.0   09/03/2022 10:49 AM 5.8 4.6 - 6.5 % Final    Comment:    Glycemic Control Guidelines for People with Diabetes:Non Diabetic:  <6%Goal of Therapy: <7%Additional Action Suggested:  >8%     CBG: Recent Labs  Lab 06/13/23 1821  GLUCAP 77     Review of Systems:   Patient is encephalopathic; therefore, history has been obtained from chart review.    Past Medical History:  She,  has a past medical history of Acute on chronic diastolic (congestive) heart failure (HCC), Anemia, Anxiety, Arthritis, Asthma, Carotid artery occlusion, Chronic fatigue, Chronic kidney disease, Closed left hip fracture (HCC), Clotting disorder (HCC), Contusion of left knee, COPD (chronic obstructive pulmonary disease) (HCC), Depression, reactive, Diabetes mellitus, Diastolic dysfunction, Difficulty in walking, Family history of heart disease, Generalized muscle weakness, Gout, High cholesterol, History of falling, Hypertension, Hypothyroidism, Interstitial lung disease (HCC), 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   Surgical History:   Past Surgical History:  Procedure Laterality Date   BRAIN SURGERY     Gamma knife 10/13. Needs repeat spring  '14   CARDIAC CATHETERIZATION N/A 10/31/2014   Procedure: Right/Left Heart Cath and Coronary Angiography;  Surgeon: Corky Crafts, MD;  Location: [facility] INVASIVE CV LAB;  Service: Cardiovascular;  Laterality: N/A;   COLONOSCOPY WITH PROPOFOL N/A 04/14/2022   Procedure: COLONOSCOPY WITH PROPOFOL;  Surgeon: Beverley Fiedler, MD;  Location: [facility] ENDOSCOPY;  Service: Gastroenterology;  Laterality: N/A;   CYST EXCISION     2 on face   CYST REMOVAL NECK     ESOPHAGOGASTRODUODENOSCOPY (EGD) WITH PROPOFOL N/A 09/14/2014   Procedure: ESOPHAGOGASTRODUODENOSCOPY (EGD) WITH PROPOFOL;  Surgeon: Louis Meckel, MD;  Location: [facility] ENDOSCOPY;  Service: Endoscopy;  Laterality: N/A;   INCISION AND DRAINAGE HIP Left 01/16/2017   Procedure: IRRIGATION AND  DEBRIDEMENT LEFT  HIP;  Surgeon: Kathryne Hitch, MD;  Location: [facility] ORS;  Service: Orthopedics;  Laterality: Left;   INTRAMEDULLARY (IM) NAIL INTERTROCHANTERIC Left 11/29/2016   Procedure: INTRAMEDULLARY (IM) NAIL INTERTROCHANTRIC;  Surgeon: Kathryne Hitch, MD;  Location: [facility] OR;  Service: Orthopedics;  Laterality: Left;   OVARY SURGERY     RIGHT HEART CATH N/A 08/12/2021   Procedure: RIGHT HEART CATH;  Surgeon: Runell Gess, MD;  Location: [facility] INVASIVE CV LAB;  Service: Cardiovascular;  Laterality: N/A;   SHOULDER SURGERY Left    TONSILLECTOMY  age 31   VAGINAL HYSTERECTOMY     VIDEO BRONCHOSCOPY Bilateral 05/31/2013   Procedure: VIDEO BRONCHOSCOPY WITHOUT FLUORO;  Surgeon: Kalman Shan, MD;  Location: [facility] ENDOSCOPY;  Service: Cardiopulmonary;  Laterality: Bilateral;   video bronscoscopy  05/19/1998   lung     Social History:   reports that she has never smoked. She has never used smokeless tobacco. She reports that she does not drink alcohol and does not use drugs.   Family History:  Her family history includes Breast cancer in her sister and sister; Diabetes in her brother, mother, and sister; Esophageal cancer in her brother; Fibroids in her daughter; Gout in her brother; Heart attack in her brother and mother; Heart disease in her brother; Heart murmur in her son; Hypertension in her brother, daughter, daughter, father, and son; Kidney cancer in her brother; Kidney failure in her brother; Lung cancer in her father; Prostate cancer in her brother; Rheum arthritis in her maternal uncle; Stomach cancer in her brother; Uterine cancer in her daughter. There is no history of Alzheimer's disease or Dementia.   Allergies Allergies  Allergen Reactions   Codeine Swelling and Other (See Comments)    Facial swelling, Chest pain, and swelling in legs    Infliximab Anaphylaxis    (REMICADE) "sent me into shock"    Lisinopril Swelling and Rash    Face and neck swelling     Ofev [Nintedanib] Diarrhea     Home Medications  Prior to Admission medications   Medication Sig Start Date End Date Taking? Authorizing Provider  acetaminophen (TYLENOL) 500 MG tablet Take 1,000 mg by mouth as needed for moderate pain.    [provider]  albuterol (PROVENTIL) (2.5 MG/3ML) 0.083% nebulizer solution Take 3 mLs (2.5 mg total) by nebulization every 6 (six) hours as needed for wheezing. Patient taking differently: Take 2.5 mg by nebulization 2 (two) times daily as needed for wheezing or shortness of breath. 05/03/21   Medina-Vargas, Monina C, NP  albuterol (VENTOLIN HFA) 108 (90 Base) MCG/ACT inhaler Inhale 2 puffs into the lungs as needed for wheezing or shortness of breath.    [provider]  allopurinol (ZYLOPRIM) 300 MG tablet Take 1 tablet (300 mg total) by mouth daily. 05/03/21   Medina-Vargas, Monina C, NP  Artificial Tear Ointment (DRY EYES OP) Place 2 drops into both eyes as needed (dry eyes).    [provider]  atorvastatin (LIPITOR) 10 MG tablet Take 1 tablet (10 mg total) by mouth daily. Patient taking differently: Take 10 mg by mouth at bedtime. 08/14/22   [name], MD  azithromycin (ZITHROMAX) 250 MG tablet Take 1 tablet (250 mg total) by mouth every Monday, Wednesday, and Friday. Take every Monday, Wednesday, Friday. 06/03/23 06/02/24  Kalman Shan, MD  Calcium Carb-Cholecalciferol (CALCIUM + D3 PO) Take 2 tablets by mouth daily.    [provider]  Camphor-Menthol-Methyl Sal (SALONPAS EX) Apply 1 patch topically as  needed (shoulder and leg pain).    [provider]  Cholecalciferol (VITAMIN D3) 50 MCG (2000 UT) capsule Take 2,000 Units by mouth daily.    [provider]  colchicine 0.6 MG tablet Take 1 tablet (0.6 mg total) by mouth daily as needed (gout flares). 05/03/21   Medina-Vargas, Monina C, NP  cyclobenzaprine (FLEXERIL) 5 MG tablet Take 1 tablet (5 mg total) by mouth at bedtime as needed for  muscle spasms. 11/27/22   Jones Bales, NP  diclofenac Sodium (VOLTAREN) 1 % GEL Apply 2 g topically 3 (three) times daily. Apply to both hands and knees Patient taking differently: Apply 1 Application topically in the morning, at noon, and at bedtime. 05/03/21   Medina-Vargas, Monina C, NP  donepezil (ARICEPT) 10 MG tablet Take 1 tablet (10 mg total) by mouth at bedtime. 09/08/22   Anson Fret, MD  doxycycline (VIBRA-TABS) 100 MG tablet Take 1 tablet (100 mg total) by mouth 2 (two) times daily. 04/09/23   Kalman Shan, MD  DULoxetine (CYMBALTA) 60 MG capsule Take 1 capsule (60 mg total) by mouth daily. 11/27/22   Jones Bales, NP  etanercept (ENBREL SURECLICK) 50 MG/ML injection Inject 50 mg into the skin once a week.    [provider]  fluticasone (FLONASE) 50 MCG/ACT nasal spray Place 1 spray into both nostrils daily. Patient taking differently: Place 2 sprays into both nostrils daily. 11/28/22   [name], MD  furosemide (LASIX) 80 MG tablet Take 1 tablet (80 mg total) by mouth daily. 05/15/23   Runell Gess, MD  gabapentin (NEURONTIN) 600 MG tablet Take 2 tablets (1,200 mg total) by mouth 3 (three) times daily. 05/25/23   Jones Bales, NP  guaiFENesin (MUCINEX) 600 MG 12 hr tablet Take 2 tablets (1,200 mg total) by mouth 2 (two) times daily. 05/03/21   Medina-Vargas, Monina C, NP  hydrALAZINE (APRESOLINE) 25 MG tablet TAKE 1 TABLET(25 MG) BY MOUTH TWICE DAILY 02/13/23   Runell Gess, MD  hydroxychloroquine (PLAQUENIL) 200 MG tablet Take 1 tablet (200 mg total) by mouth daily. 05/03/21   Medina-Vargas, Monina C, NP  leflunomide (ARAVA) 20 MG tablet Take 1 tablet (20 mg total) by mouth daily. 05/03/21 08/09/23  Medina-Vargas, Monina C, NP  levothyroxine (SYNTHROID) 50 MCG tablet Take 1 tablet (50 mcg total) by mouth daily. 11/28/22   [name], MD  lidocaine (LIDODERM) 5 % Place 1 patch onto the skin daily. Remove & Discard patch within 12 hours or as  directed by MD 12/24/22   [name], MD  linaclotide [facility]) 72 MCG capsule Take 1 capsule (72 mcg total) by mouth daily before breakfast. 08/14/22   [name], MD  Menthol, Topical Analgesic, (BIOFREEZE ROLL-ON EX) Apply 1 Application topically as needed (pain).    [provider]  metolazone (ZAROXOLYN) 2.5 MG tablet TAKE 1 TABLET BY MOUTH DAILY AS NEEDED FOR 2 DAYS FOR WEIGHT ABOVE 255 POUNDS 05/03/21   Medina-Vargas, Monina C, NP  metoprolol succinate (TOPROL-XL) 100 MG 24 hr tablet TAKE 1 TABLET BY MOUTH DAILY ALONG WITH 25 MG TABLET 03/04/23   Runell Gess, MD  metoprolol succinate (TOPROL-XL) 25 MG 24 hr tablet Take 25 mg (1 tab) along with a 100 mg tab for a total daily dose of 125 mg. Patient taking differently: Take 25 mg by mouth daily. 11/28/22   [name], MD  morphine (MS CONTIN) 15 MG 12 hr tablet Take 1 tablet (  15 mg total) by mouth every 12 (twelve) hours. 06/10/23   Jones Bales, NP  morphine (MSIR) 15 MG tablet Take 1 tablet (15 mg total) by mouth 2 (two) times daily. 05/25/23   Jones Bales, NP  Multiple Vitamin (MULTIVITAMIN WITH MINERALS) TABS Take 1 tablet by mouth daily.     [provider]  olmesartan (BENICAR) 40 MG tablet Take 1 tablet (40 mg total) by mouth daily. 11/28/22   [name], MD  ondansetron (ZOFRAN-ODT) 4 MG disintegrating tablet Take 1 tablet (4 mg total) by mouth every 8 (eight) hours as needed for nausea or vomiting. 06/12/23   [name], MD  OXYGEN Inhale 3 L into the lungs See admin instructions. 3 L at bedtime, and 3 L during the day as needed for exertion    [provider]  pantoprazole (PROTONIX) 40 MG tablet Take 1 tablet (40 mg total) by mouth 2 (two) times daily. take 1 tablet by mouth once daily MINUTES BEFORE 1ST MEAL OF THE DAY 06/12/23   [name], MD  Potassium Chloride ER 20 MEQ TBCR Take 1 tablet (20 mEq total) by mouth daily. 11/28/22   [name], MD  predniSONE  (DELTASONE) 10 MG tablet Take 1 tablet (10 mg total) by mouth daily with breakfast. 05/22/21   [name], MD  Semaglutide,0.25 or 0.5MG /DOS, (OZEMPIC, 0.25 OR 0.5 MG/DOSE,) 2 MG/1.5ML SOPN Inject 0.5 mg into the skin once a week. 08/19/22   [name], MD  senna (SENOKOT) 8.6 MG tablet Take 1 tablet (8.6 mg total) by mouth daily. 06/12/23   [name], MD     Critical care time: 45 minutes     JD Daryel [facility] 06/13/2023, 8:01 PM  Please see [email or web address] for pager details.  From 7A-7P if no response, please call [phone]. After hours, please call ELink [phone].

## 2023-06-13 NOTE — ED Notes (Signed)
Per Dr. Pia Mau, writer instructed to increase levophed as needed past maximum threshold in MAR to ensure MAP > 65 until central line is placed.

## 2023-06-13 NOTE — Procedures (Signed)
Central Venous Catheter Insertion Procedure Note  [name]  [id number]  [birth year]  Date:06/13/23  Time:10:39 PM   Provider Performing:Shantell Belongia Allen Norris   Procedure: Insertion of Non-tunneled Central Venous [phone]) with US guidance ([id number])   Indication(s) Medication administration and Difficult access  Consent Risks of the procedure as well as the alternatives and risks of each were explained to the patient and/or caregiver.  Consent for the procedure was obtained and is signed in the bedside chart  Anesthesia Topical only with 1% lidocaine   Timeout Verified patient identification, verified procedure, site/side was marked, verified correct patient position, special equipment/implants available, medications/allergies/relevant history reviewed, required imaging and test results available.  Sterile Technique Maximal sterile technique including full sterile barrier drape, hand hygiene, sterile gown, sterile gloves, mask, hair covering, sterile ultrasound probe cover (if used).  Procedure Description Area of catheter insertion was cleaned with chlorhexidine and draped in sterile fashion.  With real-time ultrasound guidance a central venous catheter was placed into the left internal jugular vein. Nonpulsatile blood flow and easy flushing noted in all ports.  The catheter was sutured in place and sterile dressing applied.  Complications/Tolerance None; patient tolerated the procedure well. Chest X-ray is ordered to verify placement for internal jugular or subclavian cannulation.   Chest x-ray is not ordered for femoral cannulation.  EBL Minimal  Specimen(s) None

## 2023-06-14 DIAGNOSIS — A419 Sepsis, unspecified organism: Secondary | ICD-10-CM | POA: Diagnosis not present

## 2023-06-14 DIAGNOSIS — I10 Essential (primary) hypertension: Secondary | ICD-10-CM

## 2023-06-14 DIAGNOSIS — R6521 Severe sepsis with septic shock: Secondary | ICD-10-CM | POA: Diagnosis not present

## 2023-06-14 DIAGNOSIS — J9611 Chronic respiratory failure with hypoxia: Secondary | ICD-10-CM | POA: Diagnosis not present

## 2023-06-14 LAB — BLOOD CULTURE ID PANEL (REFLEXED) - BCID2

## 2023-06-14 LAB — BASIC METABOLIC PANEL
Anion gap: 8 (ref 5–15)
BUN: 12 mg/dL (ref 8–23)
CO2: 26 mmol/L (ref 22–32)
Calcium: 8 mg/dL — ABNORMAL LOW (ref 8.9–10.3)
Chloride: 102 mmol/L (ref 98–111)
Creatinine, Ser: 1.12 mg/dL — ABNORMAL HIGH (ref 0.44–1.00)
GFR, Estimated: 49 mL/min — ABNORMAL LOW (ref >60)
Glucose, Bld: 163 mg/dL — ABNORMAL HIGH (ref 70–99)
Potassium: 3.5 mmol/L (ref 3.5–5.1)
Sodium: 136 mmol/L (ref 135–145)

## 2023-06-14 LAB — AMMONIA: Ammonia: 12 umol/L (ref 9–35)

## 2023-06-14 LAB — LACTIC ACID, PLASMA
Lactic Acid, Venous: 1 mmol/L (ref 0.5–1.9)
Lactic Acid, Venous: 1.1 mmol/L (ref 0.5–1.9)
Lactic Acid, Venous: 1.9 mmol/L (ref 0.5–1.9)

## 2023-06-14 LAB — CBC
HCT: 35.3 % — ABNORMAL LOW (ref 36.0–46.0)
Hemoglobin: 11 g/dL — ABNORMAL LOW (ref 12.0–15.0)
MCH: 30.6 pg (ref 26.0–34.0)
MCHC: 31.2 g/dL (ref 30.0–36.0)
MCV: 98.1 fL (ref 80.0–100.0)
Platelets: 139 10*3/uL — ABNORMAL LOW (ref 150–400)
RBC: 3.6 MIL/uL — ABNORMAL LOW (ref 3.87–5.11)
RDW: 14.7 % (ref 11.5–15.5)
WBC: 13.6 10*3/uL — ABNORMAL HIGH (ref 4.0–10.5)
nRBC: 0 % (ref 0.0–0.2)

## 2023-06-14 LAB — TSH: TSH: 0.55 u[IU]/mL (ref 0.350–4.500)

## 2023-06-14 LAB — GLUCOSE, CAPILLARY
Glucose-Capillary: 139 mg/dL — ABNORMAL HIGH (ref 70–99)
Glucose-Capillary: 123 mg/dL — ABNORMAL HIGH (ref 70–99)
Glucose-Capillary: 120 mg/dL — ABNORMAL HIGH (ref 70–99)
Glucose-Capillary: 119 mg/dL — ABNORMAL HIGH (ref 70–99)
Glucose-Capillary: 114 mg/dL — ABNORMAL HIGH (ref 70–99)
Glucose-Capillary: 111 mg/dL — ABNORMAL HIGH (ref 70–99)

## 2023-06-14 LAB — PROTIME-INR
INR: 1.1 (ref 0.8–1.2)
Prothrombin Time: 14 s (ref 11.4–15.2)

## 2023-06-14 LAB — PROCALCITONIN: Procalcitonin: 0.2 ng/mL

## 2023-06-14 LAB — MRSA NEXT GEN BY PCR, NASAL: MRSA by PCR Next Gen: NOT DETECTED

## 2023-06-14 LAB — FIBRINOGEN: Fibrinogen: 496 mg/dL — ABNORMAL HIGH (ref 210–475)

## 2023-06-14 LAB — MAGNESIUM: Magnesium: 1.7 mg/dL (ref 1.7–2.4)

## 2023-06-14 LAB — APTT: aPTT: 32 s (ref 24–36)

## 2023-06-14 LAB — STREP PNEUMONIAE URINARY ANTIGEN: Strep Pneumo Urinary Antigen: NEGATIVE

## 2023-06-14 LAB — CORTISOL: Cortisol, Plasma: 80.3 ug/dL

## 2023-06-14 LAB — HEMOGLOBIN A1C
Hgb A1c MFr Bld: 5.6 % (ref 4.8–5.6)
Mean Plasma Glucose: 114.02 mg/dL

## 2023-06-14 LAB — PHOSPHORUS: Phosphorus: 3.2 mg/dL (ref 2.5–4.6)

## 2023-06-14 MED ORDER — OXYCODONE HCL 5 MG PO TABS
5.0000 mg | ORAL_TABLET | ORAL | Status: DC | PRN
  Administered 2023-06-14 – 2023-06-15 (×5): 5 mg via ORAL
  Filled 2023-06-14 (×5): qty 1

## 2023-06-14 MED ORDER — PREDNISONE 5 MG PO TABS
10.0000 mg | ORAL_TABLET | Freq: Every day | ORAL | Status: DC
  Administered 2023-06-16 – 2023-06-17 (×2): 10 mg via ORAL
  Filled 2023-06-14: qty 1
  Filled 2023-06-14: qty 2

## 2023-06-14 MED ORDER — METOPROLOL TARTRATE 5 MG/5ML IV SOLN
5.0000 mg | Freq: Four times a day (QID) | INTRAVENOUS | Status: DC
  Administered 2023-06-14 (×2): 5 mg via INTRAVENOUS
  Filled 2023-06-14 (×2): qty 5

## 2023-06-14 MED ORDER — IPRATROPIUM-ALBUTEROL 0.5-2.5 (3) MG/3ML IN SOLN
3.0000 mL | Freq: Three times a day (TID) | RESPIRATORY_TRACT | Status: DC
  Administered 2023-06-14: 3 mL via RESPIRATORY_TRACT
  Filled 2023-06-14: qty 3

## 2023-06-14 MED ORDER — OXYCODONE HCL 5 MG PO TABS
5.0000 mg | ORAL_TABLET | Freq: Three times a day (TID) | ORAL | Status: DC | PRN
  Administered 2023-06-14: 5 mg via ORAL
  Filled 2023-06-14 (×2): qty 1

## 2023-06-14 MED ORDER — HYDRALAZINE HCL 25 MG PO TABS
25.0000 mg | ORAL_TABLET | Freq: Three times a day (TID) | ORAL | Status: DC
  Administered 2023-06-14 – 2023-06-16 (×7): 25 mg via ORAL
  Filled 2023-06-14 (×8): qty 1

## 2023-06-14 MED ORDER — HYDROCORTISONE SOD SUC (PF) 100 MG IJ SOLR
50.0000 mg | Freq: Four times a day (QID) | INTRAMUSCULAR | Status: AC
  Administered 2023-06-14 – 2023-06-15 (×6): 50 mg via INTRAVENOUS
  Filled 2023-06-14 (×6): qty 2

## 2023-06-14 MED ORDER — POLYETHYLENE GLYCOL 3350 17 G PO PACK
17.0000 g | PACK | Freq: Two times a day (BID) | ORAL | Status: DC
  Administered 2023-06-14: 17 g via ORAL
  Filled 2023-06-14: qty 1

## 2023-06-14 MED ORDER — IPRATROPIUM-ALBUTEROL 0.5-2.5 (3) MG/3ML IN SOLN
3.0000 mL | Freq: Four times a day (QID) | RESPIRATORY_TRACT | Status: DC | PRN
  Administered 2023-06-17 – 2023-06-19 (×2): 3 mL via RESPIRATORY_TRACT
  Filled 2023-06-14 (×2): qty 3

## 2023-06-14 MED ORDER — SENNOSIDES-DOCUSATE SODIUM 8.6-50 MG PO TABS
2.0000 | ORAL_TABLET | Freq: Two times a day (BID) | ORAL | Status: DC
  Administered 2023-06-14 – 2023-06-18 (×9): 2 via ORAL
  Filled 2023-06-14 (×9): qty 2

## 2023-06-14 MED ORDER — OXYCODONE HCL 5 MG PO TABS: 5.0000 mg | ORAL_TABLET | Freq: Four times a day (QID) | ORAL | Status: DC | PRN

## 2023-06-14 MED ORDER — HYDRALAZINE HCL 20 MG/ML IJ SOLN
10.0000 mg | INTRAMUSCULAR | Status: DC | PRN
  Administered 2023-06-14 – 2023-06-17 (×5): 10 mg via INTRAVENOUS
  Filled 2023-06-14 (×6): qty 1

## 2023-06-14 MED ORDER — POLYETHYLENE GLYCOL 3350 17 G PO PACK
17.0000 g | PACK | Freq: Two times a day (BID) | ORAL | Status: DC
  Administered 2023-06-14 – 2023-06-17 (×6): 17 g via ORAL
  Filled 2023-06-14 (×8): qty 1

## 2023-06-14 MED ORDER — METOPROLOL TARTRATE 25 MG PO TABS
25.0000 mg | ORAL_TABLET | Freq: Two times a day (BID) | ORAL | Status: DC
  Administered 2023-06-14: 25 mg via ORAL
  Filled 2023-06-14: qty 1

## 2023-06-14 MED ORDER — POTASSIUM CHLORIDE 10 MEQ/50ML IV SOLN
10.0000 meq | INTRAVENOUS | Status: AC
  Administered 2023-06-14 (×4): 10 meq via INTRAVENOUS
  Filled 2023-06-14 (×4): qty 50

## 2023-06-14 MED ORDER — ONDANSETRON HCL 4 MG/2ML IJ SOLN
4.0000 mg | Freq: Four times a day (QID) | INTRAMUSCULAR | Status: DC | PRN
  Administered 2023-06-14 – 2023-06-20 (×5): 4 mg via INTRAVENOUS
  Filled 2023-06-14 (×5): qty 2

## 2023-06-14 MED ORDER — CHLORHEXIDINE GLUCONATE CLOTH 2 % EX PADS
6.0000 | MEDICATED_PAD | Freq: Every day | CUTANEOUS | Status: DC
  Administered 2023-06-13 – 2023-06-26 (×6): 6 via TOPICAL

## 2023-06-14 MED ORDER — METOPROLOL TARTRATE 50 MG PO TABS
50.0000 mg | ORAL_TABLET | Freq: Two times a day (BID) | ORAL | Status: DC
Start: 2023-06-14 — End: 2023-06-20
  Administered 2023-06-14 – 2023-06-19 (×11): 50 mg via ORAL
  Filled 2023-06-14 (×2): qty 2
  Filled 2023-06-14 (×4): qty 1
  Filled 2023-06-14: qty 4
  Filled 2023-06-14 (×2): qty 1
  Filled 2023-06-14: qty 2
  Filled 2023-06-14: qty 1

## 2023-06-14 MED ORDER — LEVOTHYROXINE SODIUM 50 MCG PO TABS
50.0000 ug | ORAL_TABLET | Freq: Every day | ORAL | Status: DC
  Administered 2023-06-14 – 2023-06-26 (×13): 50 ug via ORAL
  Filled 2023-06-14 (×3): qty 2
  Filled 2023-06-14 (×3): qty 1
  Filled 2023-06-14 (×3): qty 2
  Filled 2023-06-14: qty 1
  Filled 2023-06-14: qty 2
  Filled 2023-06-14: qty 1
  Filled 2023-06-14 (×2): qty 2

## 2023-06-14 MED ORDER — ORAL CARE MOUTH RINSE: 15.0000 mL | OROMUCOSAL | Status: DC | PRN

## 2023-06-14 MED ORDER — MAGNESIUM SULFATE 2 GM/50ML IV SOLN
2.0000 g | Freq: Once | INTRAVENOUS | Status: AC
Start: 2023-06-14 — End: 2023-06-14
  Administered 2023-06-14: 2 g via INTRAVENOUS
  Filled 2023-06-14: qty 50

## 2023-06-14 NOTE — Plan of Care (Signed)

## 2023-06-14 NOTE — Progress Notes (Signed)
PHARMACY - PHYSICIAN COMMUNICATION CRITICAL VALUE ALERT - BLOOD CULTURE IDENTIFICATION (BCID)  Tanesha F Davia is an 83 y.o. female who presented to [facility] on 06/13/2023 with a chief complaint of resp failure/shock  Assessment:  1 of 4 gpc > staph spp no res (include suspected source if known)  Name of physician (or Provider) Contacted: [place]  Current antibiotics:  CTX 1/25 >> (1/29) Azith 1/25 >> (1/27) Tamiflu 1/25 >>  Changes to prescribed antibiotics recommended:  Patient is on recommended antibiotics - No changes needed  No results found. However, due to the size of the patient record, not all encounters were searched. Please check Results Review for a complete set of results.  Knute Neu 06/14/2023  2:39 PM

## 2023-06-14 NOTE — Progress Notes (Addendum)
eLink Physician-Brief Progress Note Patient Name: [name] DOB: 01-09-1941 MRN: [id number]   Date of Service  06/14/2023  HPI/Events of Note  Patient has been weaned off of vasopressors.  Blood pressure is currently 172/61 and has remained there for an extended period.  She is usually on hydralazine and metoprolol as an outpatient.  Unable to take p.o.  eICU Interventions  Will start her on metoprolol 5 mg IV every 6 hours with hold parameters.  Discussed with bedside RN.     Intervention Category Intermediate Interventions: Hypertension - evaluation and management  [name] 06/14/2023, 1:51 AM ----------------------------------------------- Addendum: Still hypotensive despite receiving metoprolol.  Blood pressure 170s to 180s systolic.  Will add hydralazine 10 mg every 4 hours as needed for systolic blood pressure greater than 160.  Bedside nurse updated. 3:20 AM

## 2023-06-14 NOTE — Progress Notes (Signed)
eLink Physician-Brief Progress Note Patient Name: [name] DOB: [birth year] MRN: [id number]   Date of Service  06/14/2023  HPI/Events of Note  Patient complaining of abdominal discomfort.  Has had 2 small bowel movements today. She also is asking for more pain medication.  eICU Interventions  Chart reviewed.  Pertinent labs and imaging reviewed.  Video assessment of patient done.  RN elicited some tenderness in the left lower quadrant.  No rebound noted.  No peritonism noted. On chronic opiates.  Patient's daughter who is at bedside states that patient has a stricture in the lower part of her colon diagnosed by GI for this chronic abdominal pain.  She was started on MiraLAX today by daytime intensivist.  Will ensure patient gets her nighttime dose and continue twice daily. Will increase frequency of as needed oxycodone. Bedside RN present for entire interaction.     Intervention Category Minor Interventions: Other:  [name] 06/14/2023, 8:45 PM

## 2023-06-14 NOTE — Progress Notes (Signed)
NAME:  [name], MRN:  [id number], DOB:  [birth year], LOS: 1 ADMISSION DATE:  06/13/2023, CONSULTATION DATE:  1/25 REFERRING MD:  Dr. Rhunette Croft, CHIEF COMPLAINT:  resp failure; shock   History of Present Illness:  Patient is a 83 year old female with pertinent PMH DMT2, HTN, chronic pain syndrome on morphine, RA on chronic steroids, ILD, bronchiectasis, chronic respiratory failure on O2, CKD 3, chronic diastolic HF, PE/DVT on Eliquis, hypothyroidism presents to [facility] ED on 1/25 with AMS.  Patient lives with husband who has dementia.  Patient's LKN 1/23.  On 1/25 family found patient to be lethargic in the morning and symptoms worsened.  EMS called and GCS 4 given 1 mg of Narcan.  GCS improved to 8.  Gave another 1 mg of Narcan.  On arrival GCS still 8 and has a gag reflex.  Noted to be hypotensive 76/46 and tachycardia 120s.  Tmax 100.6 F and WBC 12.3.  Given IV fluids and cultures obtained.  Started on Rocephin.  Found to be positive for flu A and RSV.  CXR with diffuse patchy LLL opacity suspicious for aspiration and minimal right base opacity.  Sats 93% on 3L [place].  VBG 7.4, 47, 22, 29.  CBG 109.  CT head no acute abnormality.  Ethanol and salicylate level WNL.  Acetaminophen level 12.  UDS positive for opiates.  UA with small leukocytes.  Despite IV fluids patient remains hypotensive and was started on levo.  PCCM consulted for ICU admission.   Pertinent  Medical History   Past Medical History:  Diagnosis Date   Acute on chronic diastolic (congestive) heart failure (HCC)    Anemia    iron deficiency anemia - secondary to blood loss ( chronic)    Anxiety    Arthritis    endstage changes bilateral knees/bilateral ankles.    Asthma    Carotid artery occlusion    Chronic fatigue    Chronic kidney disease    Closed left hip fracture (HCC)    Clotting disorder (HCC)    pt denies this   Contusion of left knee    due to fall 1/14.   COPD (chronic obstructive pulmonary disease) (HCC)     pulmonary fibrosis   Depression, reactive    Diabetes mellitus    type II    Diastolic dysfunction    Difficulty in walking    Family history of heart disease    Generalized muscle weakness    Gout    High cholesterol    History of falling    Hypertension    Hypothyroidism    Interstitial lung disease (HCC)    Meningioma of left sphenoid wing involving cavernous sinus (HCC) 02/17/2012   Continue diplopia, left eye pain and left headaches.     Morbid obesity (HCC)    Neuromuscular disorder (HCC)    diabetic neuropathy    Normal coronary arteries    cardiac catheterization performed  10/31/14   Pneumonia    RA (rheumatoid arthritis) (HCC)    has been off methotreaxte since 10/13.   Spinal stenosis of lumbar region    Thyroid disease    Unspecified lack of coordination    URI (upper respiratory infection)      Significant Hospital Events: Including procedures, antibiotic start and stop dates in addition to other pertinent events   1/25 admitted to [facility] with AMS, shock on levo; PCCM consulted  Interim History / Subjective:  Weaned off pressors. Complains of chronic belly pain she feels due  to constipation.  Objective   Blood pressure 127/81, pulse (!) 104, temperature 99.9 F (37.7 C), resp. rate 18, weight 106.4 kg, SpO2 95%. CVP:  [6 mmHg-9 mmHg] 6 mmHg      Intake/Output Summary (Last 24 hours) at 06/14/2023 1018 Last data filed at 06/14/2023 1610 Gross per 24 hour  Intake 3983.84 ml  Output 660 ml  Net 3323.84 ml   Filed Weights   06/14/23 0322  Weight: 106.4 kg    Examination: General:  elderly female in NAD HEENT: MM pink/dry; [place] in place Neuro: Alert, communicative, follows commands, no focal deficits CV: s1s2, tachy 110s, no m/r/g PULM:  dim BS bilaterally; [place] 3L [place] sats high 90s GI: soft, tender palpation left lower quadrant primarily but diffusely throughout Extremities: warm/dry, no edema  Skin: no rashes or lesions appreciated   Resolved Hospital  Problem list     Assessment & Plan:   Septic shock secondary to flu a/rsv pneumonia: Possible concomitant bacterial fluid but seems less likely. Plan: -Weaned off norepinephrine -Encourage p.o. intake -Continue Tamiflu (plan 5 days total), Rocephin (plan 5 days), azithromycin (plan 3 days) -Plan 48-hour stress dose steroids and resume home prednisone dose -Follow cultures  Chronic respiratory failure with hypoxia: 3L  at home Hx of rheumatoid arthritis/ ILD (interstitial lung disease)/  Pulmonary fibrosis -followed by Dr. Marchelle Gearing Plan: -cont 3L  and wean for sats >92% -hold etanercept, plaquenil, leflunomide in setting of viral infection  Acute encephalopathy due to severe sepsis: history of chronic pain syndrome on morphine and some improvement in GCS with Narcan; also w/ septic shock and pna-most likely etiology -CT head no acute abnormality Plan: -Limit sedating meds, will start oxycodone given chronic pain -hold flexeril, cymbalta, gabapentin, morphine   T2DM Plan: -SSI and CBG monitoring -A1c  Chronic diastolic CHF HTN Pulmonary HTN Last 2D echo in 2023 showed an EF of 55% with grade 1 diastolic dysfunction Plan: -Resume home hydralazine at increased to 25 3 times daily from twice daily, start metoprolol tartrate 25 mg twice daily (takes succinate 125 mg daily), hold home ARB -Daily weights; strict I/os  AKI on CKD stage IIIa creatinine has shown improvement with IV fluids Plan: -Shock resolved, encourage p.o. intake, consider additional IV fluids, status post adequate IV fluid resuscitation  History of DVT/PE: -Currently not on AC Plan: -Telemetry monitoring  Chronic anemia Plan: -trend cbc  Hypothyroidism Plan: -Home Synthroid resumed  Hx of dementia? Plan: -Consider resumption of Aricept in the future  Anticipate transfer out of the ICU today.  Best Practice (right click and "Reselect all SmartList Selections" daily)   Diet/type: Regular  consistency (see orders) DVT prophylaxis prophylactic heparin  Pressure ulcer(s): N/A GI prophylaxis: N/A Lines: yes and can remove Foley:  Yes, and it is still needed Code Status:  full code Last date of multidisciplinary goals of care discussion [1/25 daughter at bedside. State she has living will and would want everything done to save her life. She would not want to be on ventilator long term.]  Labs   CBC: Recent Labs  Lab 06/13/23 1714 06/13/23 1734 06/13/23 1740 06/14/23 0315  WBC 12.3*  --   --  13.6*  NEUTROABS 7.9*  --   --   --   HGB 10.9* 11.9* 12.2 11.0*  HCT 36.3 35.0* 36.0 35.3*  MCV 101.7*  --   --  98.1  PLT 148*  --   --  139*    Basic Metabolic Panel: Recent Labs  Lab 06/13/23  1714 06/13/23 1734 06/13/23 1740 06/14/23 0315  NA 139 139 138 136  K 3.6 3.5 3.8 3.5  CL 100  --  100 102  CO2 26  --   --  26  GLUCOSE 118*  --  109* 163*  BUN 13  --  17 12  CREATININE 1.25*  --  1.30* 1.12*  CALCIUM 8.2*  --   --  8.0*  MG  --   --   --  1.7  PHOS  --   --   --  3.2   GFR: Estimated Creatinine Clearance: 46.9 mL/min (A) (by C-G formula based on SCr of 1.12 mg/dL (H)). Recent Labs  Lab 06/13/23 1714 06/13/23 1735 06/13/23 1939 06/14/23 0023 06/14/23 0139 06/14/23 0315 06/14/23 0456  PROCALCITON  --   --   --  0.20  --   --   --   WBC 12.3*  --   --   --   --  13.6*  --   LATICACIDVEN  --    < > 2.1* 1.9 1.1  --  1.0   < > = values in this interval not displayed.    Liver Function Tests: Recent Labs  Lab 06/13/23 1714  AST 23  ALT 19  ALKPHOS 72  BILITOT 0.4  PROT 5.3*  ALBUMIN 2.8*   No results for input(s): "LIPASE", "AMYLASE" in the last 168 hours. Recent Labs  Lab 06/14/23 0023  AMMONIA 12    ABG    Component Value Date/Time   PHART 7.460 (H) 03/30/2021 1638   PCO2ART 46.6 03/30/2021 1638   PO2ART 70 (L) 03/30/2021 1638   HCO3 29.7 (H) 06/13/2023 1734   TCO2 28 06/13/2023 1740   O2SAT 36 06/13/2023 1734      Coagulation Profile: Recent Labs  Lab 06/14/23 0023  INR 1.1    Cardiac Enzymes: Recent Labs  Lab 06/13/23 1714  CKTOTAL 38    HbA1C: Hgb A1c MFr Bld  Date/Time Value Ref Range Status  06/14/2023 03:16 AM 5.6 4.8 - 5.6 % Final    Comment:    (NOTE) Pre diabetes:          5.7%-6.4%  Diabetes:              >6.4%  Glycemic control for   <7.0% adults with diabetes   12/16/2022 09:43 AM 5.5 4.8 - 5.6 % Final    Comment:    (NOTE)         Prediabetes: 5.7 - 6.4         Diabetes: >6.4         Glycemic control for adults with diabetes: <7.0     CBG: Recent Labs  Lab 06/13/23 1821 06/13/23 2300 06/14/23 0328 06/14/23 0824  GLUCAP 77 167* 139* 123*    Review of Systems:   N/a   Past Medical History:  She,  has a past medical history of Acute on chronic diastolic (congestive) heart failure (HCC), Anemia, Anxiety, Arthritis, Asthma, Carotid artery occlusion, Chronic fatigue, Chronic kidney disease, Closed left hip fracture (HCC), Clotting disorder (HCC), Contusion of left knee, COPD (chronic obstructive pulmonary disease) (HCC), Depression, reactive, Diabetes mellitus, Diastolic dysfunction, Difficulty in walking, Family history of heart disease, Generalized muscle weakness, Gout, High cholesterol, History of falling, Hypertension, Hypothyroidism, Interstitial lung disease (HCC), Meningioma of left sphenoid wing involving cavernous sinus (HCC) (02/17/2012), Morbid obesity (HCC), Neuromuscular disorder (HCC), Normal coronary arteries, Pneumonia, RA (rheumatoid arthritis) (HCC), Spinal stenosis of lumbar region, Thyroid disease,  Unspecified lack of coordination, and URI (upper respiratory infection).   Surgical History:   Past Surgical History:  Procedure Laterality Date   BRAIN SURGERY     Gamma knife 10/13. Needs repeat spring  '14   CARDIAC CATHETERIZATION N/A 10/31/2014   Procedure: Right/Left Heart Cath and Coronary Angiography;  Surgeon: Corky Crafts,  MD;  Location: [facility] INVASIVE CV LAB;  Service: Cardiovascular;  Laterality: N/A;   COLONOSCOPY WITH PROPOFOL N/A 04/14/2022   Procedure: COLONOSCOPY WITH PROPOFOL;  Surgeon: Beverley Fiedler, MD;  Location: [facility] ENDOSCOPY;  Service: Gastroenterology;  Laterality: N/A;   CYST EXCISION     2 on face   CYST REMOVAL NECK     ESOPHAGOGASTRODUODENOSCOPY (EGD) WITH PROPOFOL N/A 09/14/2014   Procedure: ESOPHAGOGASTRODUODENOSCOPY (EGD) WITH PROPOFOL;  Surgeon: Louis Meckel, MD;  Location: [facility] ENDOSCOPY;  Service: Endoscopy;  Laterality: N/A;   INCISION AND DRAINAGE HIP Left 01/16/2017   Procedure: IRRIGATION AND DEBRIDEMENT LEFT HIP;  Surgeon: Kathryne Hitch, MD;  Location: [facility] ORS;  Service: Orthopedics;  Laterality: Left;   INTRAMEDULLARY (IM) NAIL INTERTROCHANTERIC Left 11/29/2016   Procedure: INTRAMEDULLARY (IM) NAIL INTERTROCHANTRIC;  Surgeon: Kathryne Hitch, MD;  Location: [facility] OR;  Service: Orthopedics;  Laterality: Left;   OVARY SURGERY     RIGHT HEART CATH N/A 08/12/2021   Procedure: RIGHT HEART CATH;  Surgeon: Runell Gess, MD;  Location: [facility] INVASIVE CV LAB;  Service: Cardiovascular;  Laterality: N/A;   SHOULDER SURGERY Left    TONSILLECTOMY  age 68   VAGINAL HYSTERECTOMY     VIDEO BRONCHOSCOPY Bilateral 05/31/2013   Procedure: VIDEO BRONCHOSCOPY WITHOUT FLUORO;  Surgeon: Kalman Shan, MD;  Location: [facility] ENDOSCOPY;  Service: Cardiopulmonary;  Laterality: Bilateral;   video bronscoscopy  05/19/1998   lung     Social History:   reports that she has never smoked. She has never used smokeless tobacco. She reports that she does not drink alcohol and does not use drugs.   Family History:  Her family history includes Breast cancer in her sister and sister; Diabetes in her brother, mother, and sister; Esophageal cancer in her brother; Fibroids in her daughter; Gout in her brother; Heart attack in her brother and mother; Heart disease in her brother; Heart murmur in her son;  Hypertension in her brother, daughter, daughter, father, and son; Kidney cancer in her brother; Kidney failure in her brother; Lung cancer in her father; Prostate cancer in her brother; Rheum arthritis in her maternal uncle; Stomach cancer in her brother; Uterine cancer in her daughter. There is no history of Alzheimer's disease or Dementia.   Allergies Allergies  Allergen Reactions   Codeine Swelling and Other (See Comments)    Facial swelling, Chest pain, and swelling in legs    Infliximab Anaphylaxis    (REMICADE) "sent me into shock"    Lisinopril Swelling and Rash    Face and neck swelling    Ofev [Nintedanib] Diarrhea     Home Medications  Prior to Admission medications   Medication Sig Start Date End Date Taking? Authorizing Provider  acetaminophen (TYLENOL) 500 MG tablet Take 1,000 mg by mouth as needed for moderate pain.    [provider]  albuterol (PROVENTIL) (2.5 MG/3ML) 0.083% nebulizer solution Take 3 mLs (2.5 mg total) by nebulization every 6 (six) hours as needed for wheezing. Patient taking differently: Take 2.5 mg by nebulization 2 (two) times daily as needed for wheezing or shortness of breath. 05/03/21   Medina-Vargas, Margit Banda, NP  albuterol (VENTOLIN HFA) 108 (90 Base) MCG/ACT inhaler Inhale 2 puffs into the lungs as needed for wheezing or shortness of breath.    [provider]  allopurinol (ZYLOPRIM) 300 MG tablet Take 1 tablet (300 mg total) by mouth daily. 05/03/21   Medina-Vargas, Monina C, NP  Artificial Tear Ointment (DRY EYES OP) Place 2 drops into both eyes as needed (dry eyes).    [provider]  atorvastatin (LIPITOR) 10 MG tablet Take 1 tablet (10 mg total) by mouth daily. Patient taking differently: Take 10 mg by mouth at bedtime. 08/14/22   [name], MD  azithromycin (ZITHROMAX) 250 MG tablet Take 1 tablet (250 mg total) by mouth every Monday, Wednesday, and Friday. Take every Monday, Wednesday, Friday. 06/03/23  06/02/24  Kalman Shan, MD  Calcium Carb-Cholecalciferol (CALCIUM + D3 PO) Take 2 tablets by mouth daily.    [provider]  Camphor-Menthol-Methyl Sal (SALONPAS EX) Apply 1 patch topically as needed (shoulder and leg pain).    [provider]  Cholecalciferol (VITAMIN D3) 50 MCG (2000 UT) capsule Take 2,000 Units by mouth daily.    [provider]  colchicine 0.6 MG tablet Take 1 tablet (0.6 mg total) by mouth daily as needed (gout flares). 05/03/21   Medina-Vargas, Monina C, NP  cyclobenzaprine (FLEXERIL) 5 MG tablet Take 1 tablet (5 mg total) by mouth at bedtime as needed for muscle spasms. 11/27/22   Jones Bales, NP  diclofenac Sodium (VOLTAREN) 1 % GEL Apply 2 g topically 3 (three) times daily. Apply to both hands and knees Patient taking differently: Apply 1 Application topically in the morning, at noon, and at bedtime. 05/03/21   Medina-Vargas, Monina C, NP  donepezil (ARICEPT) 10 MG tablet Take 1 tablet (10 mg total) by mouth at bedtime. 09/08/22   Anson Fret, MD  doxycycline (VIBRA-TABS) 100 MG tablet Take 1 tablet (100 mg total) by mouth 2 (two) times daily. 04/09/23   Kalman Shan, MD  DULoxetine (CYMBALTA) 60 MG capsule Take 1 capsule (60 mg total) by mouth daily. 11/27/22   Jones Bales, NP  etanercept (ENBREL SURECLICK) 50 MG/ML injection Inject 50 mg into the skin once a week.    [provider]  fluticasone (FLONASE) 50 MCG/ACT nasal spray Place 1 spray into both nostrils daily. Patient taking differently: Place 2 sprays into both nostrils daily. 11/28/22   [name], MD  furosemide (LASIX) 80 MG tablet Take 1 tablet (80 mg total) by mouth daily. 05/15/23   Runell Gess, MD  gabapentin (NEURONTIN) 600 MG tablet Take 2 tablets (1,200 mg total) by mouth 3 (three) times daily. 05/25/23   Jones Bales, NP  guaiFENesin (MUCINEX) 600 MG 12 hr tablet Take 2 tablets (1,200 mg total) by mouth 2 (two) times daily. 05/03/21    Medina-Vargas, Monina C, NP  hydrALAZINE (APRESOLINE) 25 MG tablet TAKE 1 TABLET(25 MG) BY MOUTH TWICE DAILY 02/13/23   Runell Gess, MD  hydroxychloroquine (PLAQUENIL) 200 MG tablet Take 1 tablet (200 mg total) by mouth daily. 05/03/21   Medina-Vargas, Monina C, NP  leflunomide (ARAVA) 20 MG tablet Take 1 tablet (20 mg total) by mouth daily. 05/03/21 08/09/23  Medina-Vargas, Monina C, NP  levothyroxine (SYNTHROID) 50 MCG tablet Take 1 tablet (50 mcg total) by mouth daily. 11/28/22   [name], MD  lidocaine (LIDODERM) 5 % Place 1 patch onto the skin daily. Remove & Discard patch within 12 hours or as directed by  MD 12/24/22   [name], MD  linaclotide Karlene Einstein) 72 MCG capsule Take 1 capsule (72 mcg total) by mouth daily before breakfast. 08/14/22   [name], MD  Menthol, Topical Analgesic, (BIOFREEZE ROLL-ON EX) Apply 1 Application topically as needed (pain).    [provider]  metolazone (ZAROXOLYN) 2.5 MG tablet TAKE 1 TABLET BY MOUTH DAILY AS NEEDED FOR 2 DAYS FOR WEIGHT ABOVE 255 POUNDS 05/03/21   Medina-Vargas, Monina C, NP  metoprolol succinate (TOPROL-XL) 100 MG 24 hr tablet TAKE 1 TABLET BY MOUTH DAILY ALONG WITH 25 MG TABLET 03/04/23   Runell Gess, MD  metoprolol succinate (TOPROL-XL) 25 MG 24 hr tablet Take 25 mg (1 tab) along with a 100 mg tab for a total daily dose of 125 mg. Patient taking differently: Take 25 mg by mouth daily. 11/28/22   [name], MD  morphine (MS CONTIN) 15 MG 12 hr tablet Take 1 tablet (15 mg total) by mouth every 12 (twelve) hours. 06/10/23   Jones Bales, NP  morphine (MSIR) 15 MG tablet Take 1 tablet (15 mg total) by mouth 2 (two) times daily. 05/25/23   Jones Bales, NP  Multiple Vitamin (MULTIVITAMIN WITH MINERALS) TABS Take 1 tablet by mouth daily.     [provider]  olmesartan (BENICAR) 40 MG tablet Take 1 tablet (40 mg total) by mouth daily. 11/28/22   [name], MD  ondansetron (ZOFRAN-ODT)  4 MG disintegrating tablet Take 1 tablet (4 mg total) by mouth every 8 (eight) hours as needed for nausea or vomiting. 06/12/23   [name], MD  OXYGEN Inhale 3 L into the lungs See admin instructions. 3 L at bedtime, and 3 L during the day as needed for exertion    [provider]  pantoprazole (PROTONIX) 40 MG tablet Take 1 tablet (40 mg total) by mouth 2 (two) times daily. take 1 tablet by mouth once daily MINUTES BEFORE 1ST MEAL OF THE DAY 06/12/23   [name], MD  Potassium Chloride ER 20 MEQ TBCR Take 1 tablet (20 mEq total) by mouth daily. 11/28/22   [name], MD  predniSONE (DELTASONE) 10 MG tablet Take 1 tablet (10 mg total) by mouth daily with breakfast. 05/22/21   [name], MD  Semaglutide,0.25 or 0.5MG /DOS, (OZEMPIC, 0.25 OR 0.5 MG/DOSE,) 2 MG/1.5ML SOPN Inject 0.5 mg into the skin once a week. 08/19/22   [name], MD  senna (SENOKOT) 8.6 MG tablet Take 1 tablet (8.6 mg total) by mouth daily. 06/12/23   [name], MD     Critical care time:     CRITICAL CARE Performed by: Karren Burly   Total critical care time: 31 minutes  Critical care time was exclusive of separately billable procedures and treating other patients.  Critical care was necessary to treat or prevent imminent or life-threatening deterioration.  Critical care was time spent personally by me on the following activities: development of treatment plan with patient and/or surrogate as well as nursing, discussions with consultants, evaluation of patient's response to treatment, examination of patient, obtaining history from patient or surrogate, ordering and performing treatments and interventions, ordering and review of laboratory studies, ordering and review of radiographic studies, pulse oximetry and re-evaluation of patient's condition.   Karren Burly, MD [facility] 06/14/2023, 10:18 AM  Please see [email or web address] for pager  details. From 7A-7P if no response, please call [phone]. After hours,  please call ELink [phone].

## 2023-06-14 NOTE — Progress Notes (Signed)
[facility] ADULT ICU REPLACEMENT PROTOCOL   The patient does apply for the [facility] Adult ICU Electrolyte Replacment Protocol based on the criteria listed below:   1.Exclusion criteria: TCTS, ECMO, Dialysis, and Myasthenia Gravis patients 2. Is GFR >/= 30 ml/min? Yes.    Patient's GFR today is 49 3. Is SCr </= 2? Yes.   Patient's SCr is 1.12 mg/dL 4. Did SCr increase >/= 0.5 in 24 hours? No. 5.Pt's weight >40kg  Yes.   6. Abnormal electrolyte(s): K+ 3.5, Mag 1.7  7. Electrolytes replaced per protocol 8.  Call MD STAT for K+ </= 2.5, Phos </= 1, or Mag </= 1 Physician:  Dr. Cloyd Stagers        pt too somnolent for PO's despite order meds w/sips  [name] 06/14/2023 4:50 AM

## 2023-06-15 ENCOUNTER — Inpatient Hospital Stay (HOSPITAL_COMMUNITY): Payer: Medicare Other

## 2023-06-15 DIAGNOSIS — R578 Other shock: Secondary | ICD-10-CM

## 2023-06-15 DIAGNOSIS — A419 Sepsis, unspecified organism: Secondary | ICD-10-CM | POA: Diagnosis not present

## 2023-06-15 DIAGNOSIS — R6521 Severe sepsis with septic shock: Secondary | ICD-10-CM | POA: Diagnosis not present

## 2023-06-15 LAB — RESPIRATORY PANEL BY PCR
Adenovirus: NOT DETECTED
Bordetella Parapertussis: NOT DETECTED
Bordetella pertussis: NOT DETECTED
Chlamydophila pneumoniae: NOT DETECTED
Coronavirus 229E: NOT DETECTED
Coronavirus HKU1: NOT DETECTED
Coronavirus NL63: NOT DETECTED
Coronavirus OC43: NOT DETECTED
Influenza A H1 2009: DETECTED — AB
Influenza B: NOT DETECTED
Metapneumovirus: NOT DETECTED
Mycoplasma pneumoniae: NOT DETECTED
Parainfluenza Virus 1: NOT DETECTED
Parainfluenza Virus 2: NOT DETECTED
Parainfluenza Virus 3: NOT DETECTED
Parainfluenza Virus 4: NOT DETECTED
Respiratory Syncytial Virus: DETECTED — AB
Rhinovirus / Enterovirus: NOT DETECTED

## 2023-06-15 LAB — GLUCOSE, CAPILLARY
Glucose-Capillary: 100 mg/dL — ABNORMAL HIGH (ref 70–99)
Glucose-Capillary: 109 mg/dL — ABNORMAL HIGH (ref 70–99)
Glucose-Capillary: 112 mg/dL — ABNORMAL HIGH (ref 70–99)
Glucose-Capillary: 125 mg/dL — ABNORMAL HIGH (ref 70–99)
Glucose-Capillary: 136 mg/dL — ABNORMAL HIGH (ref 70–99)
Glucose-Capillary: 62 mg/dL — ABNORMAL LOW (ref 70–99)
Glucose-Capillary: 85 mg/dL (ref 70–99)
Glucose-Capillary: 95 mg/dL (ref 70–99)

## 2023-06-15 LAB — ECHOCARDIOGRAM COMPLETE
AR max vel: 2.04 cm2
AV Area VTI: 1.81 cm2
AV Area mean vel: 1.87 cm2
AV Mean grad: 5 mm[Hg]
AV Peak grad: 9.1 mm[Hg]
Ao pk vel: 1.51 m/s
Area-P 1/2: 5.54 cm2
S' Lateral: 2.7 cm
Weight: 3753.11 [oz_av]

## 2023-06-15 LAB — CALCIUM, IONIZED: Calcium, Ionized, Serum: 4.5 mg/dL (ref 4.5–5.6)

## 2023-06-15 LAB — LEGIONELLA PNEUMOPHILA SEROGP 1 UR AG: L. pneumophila Serogp 1 Ur Ag: NEGATIVE

## 2023-06-15 MED ORDER — MORPHINE SULFATE (PF) 2 MG/ML IV SOLN
4.0000 mg | INTRAVENOUS | Status: DC | PRN
  Administered 2023-06-15 – 2023-06-17 (×5): 4 mg via INTRAVENOUS
  Filled 2023-06-15 (×5): qty 2

## 2023-06-15 MED ORDER — INSULIN ASPART 100 UNIT/ML IJ SOLN
0.0000 [IU] | Freq: Three times a day (TID) | INTRAMUSCULAR | Status: DC
Start: 2023-06-16 — End: 2023-06-27
  Administered 2023-06-18 – 2023-06-26 (×12): 1 [IU] via SUBCUTANEOUS

## 2023-06-15 MED ORDER — DEXTROSE 50 % IV SOLN
INTRAVENOUS | Status: AC
  Administered 2023-06-15: 25 mL
  Filled 2023-06-15: qty 50

## 2023-06-15 MED ORDER — MORPHINE SULFATE ER 15 MG PO TBCR
15.0000 mg | EXTENDED_RELEASE_TABLET | Freq: Two times a day (BID) | ORAL | Status: DC
  Administered 2023-06-15 – 2023-06-26 (×21): 15 mg via ORAL
  Filled 2023-06-15 (×22): qty 1

## 2023-06-15 MED ORDER — OXYCODONE HCL 5 MG PO TABS
10.0000 mg | ORAL_TABLET | ORAL | Status: DC | PRN
  Administered 2023-06-15 – 2023-06-16 (×2): 10 mg via ORAL
  Filled 2023-06-15 (×2): qty 2

## 2023-06-15 NOTE — Progress Notes (Signed)
[facility] night cross cover note:   I was notified by RN that the patient is confused, agitated, pulling at equipment, prompting placement to bilateral mitts for patient's safety and interferes with medical care.  Additionally, the patient is refusing her evening oral medications which include hydralazine, metoprolol, stool softener, Tamiflu, as well as scheduled long-acting morphine.  I have added as needed IV morphine for pain for now, but will otherwise hold the evening scheduled doses of the remaining oral medications.    Newton Pigg, DO Hospitalist

## 2023-06-15 NOTE — Progress Notes (Signed)
*  PRELIMINARY RESULTS* Echocardiogram 2D Echocardiogram has been performed.  [name] 06/15/2023, 9:45 AM

## 2023-06-15 NOTE — TOC CM/SW Note (Signed)
Left secure VM requesting return call with patient's daughter,Surena, due to patient's confusion.

## 2023-06-15 NOTE — Plan of Care (Signed)

## 2023-06-15 NOTE — Progress Notes (Signed)
PROGRESS NOTE    [name]  ZOX:[id number] DOB: June 30, 1940 DOA: 06/13/2023 PCP: [name], MD   Brief Narrative:  This 83 year old female with PMH significant for DMT2, HTN, chronic pain syndrome on morphine, RA on chronic steroids, ILD, bronchiectasis, chronic respiratory failure on O2, CKD 3b, chronic diastolic HF, PE/DVT on Eliquis, hypothyroidism presents to [facility] ED on 1/25 with altered mental status. Patient was found to be lethargic in the morning by family and symptoms has worsened.  Patient was given Narcan twice with slight improvement. Noted to be hypotensive 76/46 and tachycardia 120s.  Tmax 100.6 F and WBC 12.3.  She was given IV antibiotics and IV fluids.  Blood cultures were obtained.  Found to be positive for flu A and RSV.  CXR with diffuse patchy LLL opacity suspicious for aspiration and minimal right base opacity.  Sats 93% on 3L [place].  CT head no acute abnormality.  Ethanol and salicylate level WNL.  Acetaminophen level 12.  UDS positive for opiates.  UA with small leukocytes.  Despite IV fluids patient remains hypotensive and was started on levophed.  Patient was admitted initially in the ICU. TRH pickup 06/15/2023.  She is off Levophed.  Assessment & Plan:   Principal Problem:   Septic shock (HCC)  Septic shock secondary to flu A / RSV pneumonia: Possible concomitant bacterial infection but seems less likely. Patient initially required Levophed support , now off Levophed. Encourage oral intake. Continue Tamiflu (plan 5 days total), Rocephin (plan 5 days), azithromycin (plan 3 days) Continue 48-hour stress dose steroids and resume home prednisone dose Blood cultures staph epidermidis likely contaminant, urine culture 100 K gram-negative rods. Blood Pressure improved, shock resolved.  Chronic hypoxic respiratory failure: History of rheumatoid arthritis / Interstitial lung disease: Continue supplemental oxygen which is at baseline 3 L/min She follows with Dr.  Marchelle Gearing. Hold etanercept, plaquenil, leflunomide in setting of viral infection.   Acute encephalopathy due to severe sepsis:  Patient has history of chronic pain syndrome on morphine and some improvement in GCS with Narcan;  Also w/ septic shock and  PNA - Most likely etiology CT head no acute abnormality Limit sedating meds, will start oxycodone given chronic pain. Hold flexeril, cymbalta, gabapentin, morphine   Diabetes mellitus type II SSI and CBG monitoring Hb A1c 5.6.  Well-controlled.   Chronic diastolic CHF/ HTN: Last 2D echo in 2023 showed LVEF of 55% with grade 1 diastolic dysfunction Continue hydralazine 25 mg 3 times daily, Continue metoprolol tartrate 25 mg twice daily (takes succinate 125 mg daily), Hold home ARB Daily weights; strict I/os   AKI on CKD stage IIIa: Serum creatinine improved with IV fluids. Shock Resolved.  Encourage oral intake.   History of DVT/PE: Currently not on [facility] Telemetry monitoring   Chronic anemia: H&H remains stable.  Monitor H&H.  No signs of obvious bleeding.   Hypothyroidism: Continue levothyroxine.   Hx of dementia: Consider resumption of Aricept in the future.    DVT prophylaxis: Heparin Code Status: Full code Family Communication: Daughter at bed side. Disposition Plan:    Status is: Inpatient Remains inpatient appropriate because: Severity of illness    Consultants:  None  Procedures: None  Antimicrobials:  Anti-infectives (From admission, onward)    Start     Dose/Rate Route Frequency Ordered Stop   06/14/23 1800  cefTRIAXone (ROCEPHIN) 2 g in sodium chloride 0.9 % 100 mL IVPB        2 g 200 mL/hr over 30 Minutes Intravenous Every  24 hours 06/13/23 2027 06/19/23 1759   06/14/23 1000  oseltamivir (TAMIFLU) capsule 30 mg       Placed in "Followed by" Linked Group   30 mg Oral 2 times daily 06/13/23 2034 06/19/23 0959   06/13/23 2200  oseltamivir (TAMIFLU) capsule 75 mg  Status:  Discontinued        75 mg  Oral 2 times daily 06/13/23 2027 06/13/23 2034   06/13/23 2045  oseltamivir (TAMIFLU) capsule 75 mg       Placed in "Followed by" Linked Group   75 mg Oral Once 06/13/23 2034     06/13/23 2030  azithromycin (ZITHROMAX) 500 mg in sodium chloride 0.9 % 250 mL IVPB        500 mg 250 mL/hr over 60 Minutes Intravenous Every 24 hours 06/13/23 2027 06/16/23 2029   06/13/23 1800  cefTRIAXone (ROCEPHIN) 2 g in sodium chloride 0.9 % 100 mL IVPB        2 g 200 mL/hr over 30 Minutes Intravenous Once 06/13/23 1757 06/13/23 1912      Subjective: Patient was seen and examined at bedside.  Overnight events noted.   Patient seems alert and oriented,  asks to resume her morphine. She is off Levophed, maintaining her blood pressure.  She remains on Tamiflu and antibiotics.  Objective: Vitals:   06/15/23 1000 06/15/23 1100 06/15/23 1119 06/15/23 1200  BP: 129/84 (!) 169/110  (!) 167/104  Pulse: 89 80  84  Resp: 20 19  (!) 24  Temp:   98.4 F (36.9 C)   TempSrc:   Oral   SpO2: 93% 94%  94%  Weight:        Intake/Output Summary (Last 24 hours) at 06/15/2023 1344 Last data filed at 06/15/2023 1159 Gross per 24 hour  Intake 385.58 ml  Output 1035 ml  Net -649.42 ml   Filed Weights   06/14/23 0322  Weight: 106.4 kg    Examination:  General exam: Appears calm and comfortable, deconditioned, not in any acute distress. Respiratory system: Clear to auscultation. Respiratory effort normal.  RR 15 Cardiovascular system: S1 & S2 heard, RRR. No JVD, murmurs, rubs, gallops or clicks. No pedal edema. Gastrointestinal system: Abdomen is non distended, soft and non tender.  Normal bowel sounds heard. Central nervous system: Alert and oriented x 3. No focal neurological deficits. Extremities: No edema, no cyanosis, no clubbing Skin: No rashes, lesions or ulcers Psychiatry: Judgement and insight appear normal. Mood & affect appropriate.     Data Reviewed: I have personally reviewed following labs and  imaging studies  CBC: Recent Labs  Lab 06/13/23 1714 06/13/23 1734 06/13/23 1740 06/14/23 0315  WBC 12.3*  --   --  13.6*  NEUTROABS 7.9*  --   --   --   HGB 10.9* 11.9* 12.2 11.0*  HCT 36.3 35.0* 36.0 35.3*  MCV 101.7*  --   --  98.1  PLT 148*  --   --  139*   Basic Metabolic Panel: Recent Labs  Lab 06/13/23 1714 06/13/23 1734 06/13/23 1740 06/14/23 0315  NA 139 139 138 136  K 3.6 3.5 3.8 3.5  CL 100  --  100 102  CO2 26  --   --  26  GLUCOSE 118*  --  109* 163*  BUN 13  --  17 12  CREATININE 1.25*  --  1.30* 1.12*  CALCIUM 8.2*  --   --  8.0*  MG  --   --   --  1.7  PHOS  --   --   --  3.2   GFR: Estimated Creatinine Clearance: 46.9 mL/min (A) (by C-G formula based on SCr of 1.12 mg/dL (H)). Liver Function Tests: Recent Labs  Lab 06/13/23 1714  AST 23  ALT 19  ALKPHOS 72  BILITOT 0.4  PROT 5.3*  ALBUMIN 2.8*   No results for input(s): "LIPASE", "AMYLASE" in the last 168 hours. Recent Labs  Lab 06/14/23 0023  AMMONIA 12   Coagulation Profile: Recent Labs  Lab 06/14/23 0023  INR 1.1   Cardiac Enzymes: Recent Labs  Lab 06/13/23 1714  CKTOTAL 38   BNP (last 3 results) No results for input(s): "PROBNP" in the last 8760 hours. HbA1C: Recent Labs    06/14/23 0316  HGBA1C 5.6   CBG: Recent Labs  Lab 06/14/23 1910 06/14/23 2311 06/15/23 0318 06/15/23 0728 06/15/23 1121  GLUCAP 114* 111* 109* 100* 85   Lipid Profile: No results for input(s): "CHOL", "HDL", "LDLCALC", "TRIG", "CHOLHDL", "LDLDIRECT" in the last 72 hours. Thyroid Function Tests: Recent Labs    06/14/23 0023  TSH 0.550   Anemia Panel: No results for input(s): "VITAMINB12", "FOLATE", "FERRITIN", "TIBC", "IRON", "RETICCTPCT" in the last 72 hours. Sepsis Labs: Recent Labs  Lab 06/13/23 1939 06/14/23 0023 06/14/23 0139 06/14/23 0456  PROCALCITON  --  0.20  --   --   LATICACIDVEN 2.1* 1.9 1.1 1.0    Recent Results (from the past 240 hours)  Resp panel by RT-PCR  (RSV, Flu A&B, Covid) Urine, Catheterized     Status: Abnormal   Collection Time: 06/13/23  5:14 PM   Specimen: Urine, Catheterized; Nasal Swab  Result Value Ref Range Status   SARS Coronavirus 2 by RT PCR NEGATIVE NEGATIVE Final   Influenza A by PCR POSITIVE (A) NEGATIVE Final   Influenza B by PCR NEGATIVE NEGATIVE Final    Comment: (NOTE) The Xpert Xpress SARS-CoV-2/FLU/RSV plus assay is intended as an aid in the diagnosis of influenza from Nasopharyngeal swab specimens and should not be used as a sole basis for treatment. Nasal washings and aspirates are unacceptable for Xpert Xpress SARS-CoV-2/FLU/RSV testing.  Fact Sheet for Patients: BloggerCourse.com  Fact Sheet for Healthcare Providers: SeriousBroker.it  This test is not yet approved or cleared by the [place] FDA and has been authorized for detection and/or diagnosis of SARS-CoV-2 by FDA under an Emergency Use Authorization (EUA). This EUA will remain in effect (meaning this test can be used) for the duration of the COVID-19 declaration under Section 564(b)(1) of the Act, 21 U.S.C. section 360bbb-3(b)(1), unless the authorization is terminated or revoked.     Resp Syncytial Virus by PCR POSITIVE (A) NEGATIVE Final    Comment: (NOTE) Fact Sheet for Patients: BloggerCourse.com  Fact Sheet for Healthcare Providers: SeriousBroker.it  This test is not yet approved or cleared by the [place] FDA and has been authorized for detection and/or diagnosis of SARS-CoV-2 by FDA under an Emergency Use Authorization (EUA). This EUA will remain in effect (meaning this test can be used) for the duration of the COVID-19 declaration under Section 564(b)(1) of the Act, 21 U.S.C. section 360bbb-3(b)(1), unless the authorization is terminated or revoked.  Performed at [facility], [address] [place],  [place] [address]   Urine Culture     Status: Abnormal (Preliminary result)   Collection Time: 06/13/23  5:14 PM   Specimen: Urine, Random  Result Value Ref Range Status   Specimen Description URINE, RANDOM  Final  Special Requests   Final    URINE, CATHETERIZED Performed at [facility], [address] [place], [place] [address]    Culture >=100,000 COLONIES/mL GRAM NEGATIVE RODS (A)  Final   Report Status PENDING  Incomplete  Blood culture (routine x 2)     Status: None (Preliminary result)   Collection Time: 06/13/23  5:27 PM   Specimen: BLOOD  Result Value Ref Range Status   Specimen Description BLOOD SITE NOT SPECIFIED  Final   Special Requests   Final    BOTTLES DRAWN AEROBIC AND ANAEROBIC Blood Culture results may not be optimal due to an inadequate volume of blood received in culture bottles   Culture  Setup Time   Final    GRAM POSITIVE COCCI AEROBIC BOTTLE ONLY CRITICAL RESULT CALLED TO, READ BACK BY AND VERIFIED WITH: PHARMD JENNIE ZHOU [id number] AT 1440, ADC Performed at [facility], [address], [place], [place] [address]    Culture GRAM POSITIVE COCCI  Final   Report Status PENDING  Incomplete  Blood culture (routine x 2)     Status: Abnormal (Preliminary result)   Collection Time: 06/13/23  5:27 PM   Specimen: BLOOD  Result Value Ref Range Status   Specimen Description BLOOD SITE NOT SPECIFIED  Final   Special Requests   Final    BOTTLES DRAWN AEROBIC AND ANAEROBIC Blood Culture results may not be optimal due to an inadequate volume of blood received in culture bottles   Culture  Setup Time   Final    GRAM POSITIVE COCCI AEROBIC BOTTLE ONLY CRITICAL VALUE NOTED.  VALUE IS CONSISTENT WITH PREVIOUSLY REPORTED AND CALLED VALUE.    Culture (A)  Final    STAPHYLOCOCCUS EPIDERMIDIS THE SIGNIFICANCE OF ISOLATING THIS ORGANISM FROM A SINGLE SET OF BLOOD CULTURES WHEN MULTIPLE SETS ARE DRAWN IS UNCERTAIN. PLEASE NOTIFY THE MICROBIOLOGY DEPARTMENT WITHIN ONE WEEK  IF SPECIATION AND SENSITIVITIES ARE REQUIRED. Performed at [facility], [address] [place], [place] [address]    Report Status PENDING  Incomplete  Blood Culture ID Panel (Reflexed)     Status: Abnormal   Collection Time: 06/13/23  5:27 PM  Result Value Ref Range Status   Enterococcus faecalis NOT DETECTED NOT DETECTED Final   Enterococcus Faecium NOT DETECTED NOT DETECTED Final   Listeria monocytogenes NOT DETECTED NOT DETECTED Final   Staphylococcus species DETECTED (A) NOT DETECTED Final    Comment: CRITICAL RESULT CALLED TO, READ BACK BY AND VERIFIED WITH: PHARMD JENNIE ZHOU [id number] AT 1440, ADC    Staphylococcus aureus (BCID) NOT DETECTED NOT DETECTED Final   Staphylococcus epidermidis NOT DETECTED NOT DETECTED Final   Staphylococcus lugdunensis NOT DETECTED NOT DETECTED Final   Streptococcus species NOT DETECTED NOT DETECTED Final   Streptococcus agalactiae NOT DETECTED NOT DETECTED Final   Streptococcus pneumoniae NOT DETECTED NOT DETECTED Final   Streptococcus pyogenes NOT DETECTED NOT DETECTED Final   A.calcoaceticus-baumannii NOT DETECTED NOT DETECTED Final   Bacteroides fragilis NOT DETECTED NOT DETECTED Final   Enterobacterales NOT DETECTED NOT DETECTED Final   Enterobacter cloacae complex NOT DETECTED NOT DETECTED Final   Escherichia coli NOT DETECTED NOT DETECTED Final   Klebsiella aerogenes NOT DETECTED NOT DETECTED Final   Klebsiella oxytoca NOT DETECTED NOT DETECTED Final   Klebsiella pneumoniae NOT DETECTED NOT DETECTED Final   Proteus species NOT DETECTED NOT DETECTED Final   Salmonella species NOT DETECTED NOT DETECTED Final   Serratia marcescens NOT DETECTED NOT DETECTED  Final   Haemophilus influenzae NOT DETECTED NOT DETECTED Final   Neisseria meningitidis NOT DETECTED NOT DETECTED Final   Pseudomonas aeruginosa NOT DETECTED NOT DETECTED Final   Stenotrophomonas maltophilia NOT DETECTED NOT DETECTED Final   Candida albicans NOT DETECTED NOT  DETECTED Final   Candida auris NOT DETECTED NOT DETECTED Final   Candida glabrata NOT DETECTED NOT DETECTED Final   Candida krusei NOT DETECTED NOT DETECTED Final   Candida parapsilosis NOT DETECTED NOT DETECTED Final   Candida tropicalis NOT DETECTED NOT DETECTED Final   Cryptococcus neoformans/gattii NOT DETECTED NOT DETECTED Final    Comment: Performed at [facility], [address] [place], [place] [address]  MRSA Next Gen by PCR, Nasal     Status: None   Collection Time: 06/13/23 10:12 PM   Specimen: Nasal Mucosa; Nasal Swab  Result Value Ref Range Status   MRSA by PCR Next Gen NOT DETECTED NOT DETECTED Final    Comment: (NOTE) The GeneXpert MRSA Assay (FDA approved for NASAL specimens only), is one component of a comprehensive MRSA colonization surveillance program. It is not intended to diagnose MRSA infection nor to guide or monitor treatment for MRSA infections. Test performance is not FDA approved in patients less than 46 years old. Performed at [facility], [address] [place], [place] [address]     Radiology Studies: ECHOCARDIOGRAM COMPLETE Result Date: 06/15/2023    ECHOCARDIOGRAM REPORT   Patient Name:   [name] Date of Exam: 06/15/2023 Medical Rec #:  [id number]         Height:       64.0 in Accession #:    [id number]        Weight:       234.6 lb Date of Birth:  Jun 07, 1940         BSA:          2.094 m Patient Age:    82 years          BP:           161/86 mmHg Patient Gender: F                 HR:           88 bpm. Exam Location:  Inpatient Procedure: 2D Echo, Cardiac Doppler and Color Doppler Indications:    Shock R57.9  History:        Patient has prior history of Echocardiogram examinations, most                 recent 11/06/2021. Risk Factors:Hypertension, Diabetes,                 Dyslipidemia and Non-Smoker.  Sonographer:    Dondra Prader RVT RCS Referring Phys: [id number] Patrici Ranks  Sonographer Comments: Image acquisition challenging due to  uncooperative patient. Patient asked for exam to end. Patient unable to lie still. IMPRESSIONS  1. Left ventricular ejection fraction, by estimation, is 60 to 65%. The left ventricle has normal function. The left ventricle has no regional wall motion abnormalities. Left ventricular diastolic parameters are consistent with Grade I diastolic dysfunction (impaired relaxation).  2. Right ventricular systolic function is normal. The right ventricular size is normal. Tricuspid regurgitation signal is inadequate for assessing PA pressure.  3. The mitral valve is normal in structure. No evidence of mitral valve regurgitation. No evidence of mitral stenosis.  4. The aortic valve is tricuspid. There is mild calcification of the aortic  valve. Aortic valve regurgitation is not visualized. No aortic stenosis is present.  5. IVC not visualized.  6. Exam truncated due to patient noncompliance. FINDINGS  Left Ventricle: Left ventricular ejection fraction, by estimation, is 60 to 65%. The left ventricle has normal function. The left ventricle has no regional wall motion abnormalities. The left ventricular internal cavity size was normal in size. There is  no left ventricular hypertrophy. Left ventricular diastolic parameters are consistent with Grade I diastolic dysfunction (impaired relaxation). Right Ventricle: The right ventricular size is normal. No increase in right ventricular wall thickness. Right ventricular systolic function is normal. Tricuspid regurgitation signal is inadequate for assessing PA pressure. Left Atrium: Left atrial size was normal in size. Right Atrium: Right atrial size was normal in size. Pericardium: There is no evidence of pericardial effusion. Mitral Valve: The mitral valve is normal in structure. No evidence of mitral valve regurgitation. No evidence of mitral valve stenosis. Tricuspid Valve: The tricuspid valve is normal in structure. Tricuspid valve regurgitation is not demonstrated. Aortic Valve:  The aortic valve is tricuspid. There is mild calcification of the aortic valve. Aortic valve regurgitation is not visualized. No aortic stenosis is present. Aortic valve mean gradient measures 5.0 mmHg. Aortic valve peak gradient measures 9.1 mmHg. Aortic valve area, by VTI measures 1.81 cm. Pulmonic Valve: The pulmonic valve was normal in structure. Pulmonic valve regurgitation is not visualized. Aorta: The aortic root is normal in size and structure. Venous: The inferior vena cava was not well visualized. IAS/Shunts: No atrial level shunt detected by color flow Doppler.  LEFT VENTRICLE PLAX 2D LVIDd:         4.20 cm   Diastology LVIDs:         2.70 cm   LV e' medial:    5.51 cm/s LV PW:         1.20 cm   LV E/e' medial:  14.1 LV IVS:        0.90 cm   LV e' lateral:   6.57 cm/s LVOT diam:     1.70 cm   LV E/e' lateral: 11.9 LV SV:         47 LV SV Index:   23 LVOT Area:     2.27 cm  RIGHT VENTRICLE RV S prime:     14.50 cm/s LEFT ATRIUM             Index        RIGHT ATRIUM          Index LA diam:        2.70 cm 1.29 cm/m   RA Area:     9.72 cm LA Vol (A2C):   40.9 ml 19.54 ml/m  RA Volume:   21.60 ml 10.32 ml/m LA Vol (A4C):   31.3 ml 14.93 ml/m LA Biplane Vol: 42.8 ml 20.44 ml/m  AORTIC VALVE AV Area (Vmax):    2.04 cm AV Area (Vmean):   1.87 cm AV Area (VTI):     1.81 cm AV Vmax:           151.00 cm/s AV Vmean:          102.000 cm/s AV VTI:            0.261 m AV Peak Grad:      9.1 mmHg AV Mean Grad:      5.0 mmHg LVOT Vmax:         136.00 cm/s LVOT Vmean:  83.900 cm/s LVOT VTI:          0.208 m LVOT/AV VTI ratio: 0.80  AORTA Ao Root diam: 2.90 cm Ao Asc diam:  3.40 cm MITRAL VALVE MV Area (PHT): 5.54 cm    SHUNTS MV Decel Time: 137 msec    Systemic VTI:  0.21 m MV E velocity: 77.90 cm/s  Systemic Diam: 1.70 cm MV A velocity: 81.80 cm/s MV E/A ratio:  0.95 Dalton McleanMD Electronically signed by Wilfred Lacy Signature Date/Time: 06/15/2023/10:38:49 AM    Final    DG Chest 1 View Result  Date: 06/13/2023 CLINICAL DATA:  Dyspnea EXAM: CHEST  1 VIEW COMPARISON:  Chest x-ray 06/13/2023 FINDINGS: There are patchy airspace opacities in the right mid and upper lung as well as in the left lung base. There is no pleural effusion or pneumothorax. The cardiomediastinal silhouette is enlarged unchanged. Left-sided central venous catheter tip projects over the brachiocephalic SVC junction. No acute osseous abnormality. IMPRESSION: Patchy airspace opacities in the right mid and upper lung as well as in the left lung base, concerning for multifocal pneumonia. Electronically Signed   By: Darliss Cheney M.D.   On: 06/13/2023 23:00   CT Head Wo Contrast Result Date: 06/13/2023 CLINICAL DATA:  Mental status change, unknown cause. Altered mental status. Septic. EXAM: CT HEAD WITHOUT CONTRAST TECHNIQUE: Contiguous axial images were obtained from the base of the skull through the vertex without intravenous contrast. RADIATION DOSE REDUCTION: This exam was performed according to the departmental dose-optimization program which includes automated exposure control, adjustment of the mA and/or kV according to patient size and/or use of iterative reconstruction technique. COMPARISON:  CT head without contrast 02/02/2022 FINDINGS: Brain: Mild atrophy and moderate white matter changes are stable. The ventricles are of normal size. Deep brain nuclei are within normal limits. No significant extraaxial fluid collection is present. The brainstem and cerebellum are within normal limits. Midline structures are within normal limits. Vascular: No hyperdense vessel or unexpected calcification. Skull: Calvarium is intact. No focal lytic or blastic lesions are present. No significant extracranial soft tissue lesion is present. Sinuses/Orbits: Extensive mucosal thickening and fluid is present in the right maxillary sinus. Mucosal thickening is present within right anterior ethmoid air cells. The paranasal sinuses and mastoid air cells  are otherwise clear. Bilateral lens replacements are noted. Globes and orbits are otherwise unremarkable. A right lens replacement is present. Globes and orbits are otherwise within normal limits. IMPRESSION: 1. No acute intracranial abnormality or significant interval change. 2. Stable atrophy and moderate white matter disease. This likely reflects the sequela of chronic microvascular ischemia. 3. Right maxillary and ethmoid sinus disease. Electronically Signed   By: Marin Roberts M.D.   On: 06/13/2023 19:21   DG Chest Port 1 View Result Date: 06/13/2023 CLINICAL DATA:  Drug overdose and lethargy EXAM: PORTABLE CHEST 1 VIEW COMPARISON:  Chest radiograph dated 02/02/2022 FINDINGS: Patient is rotated to the right. Mildly low lung volumes. Diffuse patchy left lower lung opacity. Minimal right basilar linear opacity. No focal consolidations. No pleural effusion or pneumothorax. Enlarged cardiomediastinal silhouette is likely projectional. No acute osseous abnormality. IMPRESSION: 1. Diffuse patchy left lower lung opacity, suspicious for aspiration. 2. Minimal right basilar linear opacity, likely atelectasis. Electronically Signed   By: Agustin Cree M.D.   On: 06/13/2023 18:55   Scheduled Meds:  Chlorhexidine Gluconate Cloth  6 each Topical Daily   heparin  5,000 Units Subcutaneous Q8H   hydrALAZINE  25 mg Oral Q8H   hydrocortisone sod  succinate (SOLU-CORTEF) inj  50 mg Intravenous Q6H   Followed by   Melene Muller ON 06/16/2023] predniSONE  10 mg Oral Q breakfast   insulin aspart  0-6 Units Subcutaneous Q4H   levothyroxine  50 mcg Oral Daily   metoprolol tartrate  50 mg Oral BID   oseltamivir  75 mg Oral Once   Followed by   oseltamivir  30 mg Oral BID   pantoprazole  40 mg Oral Daily   polyethylene glycol  17 g Oral BID   senna-docusate  2 tablet Oral BID   Continuous Infusions:  azithromycin Stopped (06/14/23 2056)   cefTRIAXone (ROCEPHIN)  IV Stopped (06/14/23 1738)   lactated ringers        LOS: 2 days    Time spent: 50 Mins    Willeen Niece, MD [facility]   If 7PM-7AM, please contact night-coverage

## 2023-06-16 DIAGNOSIS — R6521 Severe sepsis with septic shock: Secondary | ICD-10-CM | POA: Diagnosis not present

## 2023-06-16 DIAGNOSIS — A419 Sepsis, unspecified organism: Secondary | ICD-10-CM | POA: Diagnosis not present

## 2023-06-16 LAB — URINE CULTURE

## 2023-06-16 LAB — GLUCOSE, CAPILLARY
Glucose-Capillary: 79 mg/dL (ref 70–99)
Glucose-Capillary: 94 mg/dL (ref 70–99)
Glucose-Capillary: 107 mg/dL — ABNORMAL HIGH (ref 70–99)
Glucose-Capillary: 122 mg/dL — ABNORMAL HIGH (ref 70–99)
Glucose-Capillary: 107 mg/dL — ABNORMAL HIGH (ref 70–99)

## 2023-06-16 LAB — CULTURE, BLOOD (ROUTINE X 2)

## 2023-06-16 MED ORDER — PANTOPRAZOLE SODIUM 40 MG IV SOLR
40.0000 mg | INTRAVENOUS | Status: DC
  Administered 2023-06-16: 40 mg via INTRAVENOUS
  Filled 2023-06-16 (×2): qty 10

## 2023-06-16 MED ORDER — HYDRALAZINE HCL 50 MG PO TABS
50.0000 mg | ORAL_TABLET | Freq: Three times a day (TID) | ORAL | Status: DC
  Administered 2023-06-16 – 2023-06-20 (×12): 50 mg via ORAL
  Filled 2023-06-16 (×14): qty 1

## 2023-06-16 MED ORDER — OXYCODONE HCL 5 MG PO TABS
10.0000 mg | ORAL_TABLET | ORAL | Status: DC | PRN
  Administered 2023-06-18: 10 mg via ORAL
  Filled 2023-06-16: qty 2

## 2023-06-16 NOTE — Evaluation (Signed)
Physical Therapy Evaluation Patient Details Name: [name] MRN: [id number] DOB: 1941-01-29 Today's Date: 06/16/2023  History of Present Illness  83 year old female presents to [facility] ED on 1/25 with altered mental status. Acute encephalopathy due to severe sepsis, +RSV, + Flu. PMH significant for DMT2, HTN, chronic pain syndrome on morphine, RA on chronic steroids, ILD, bronchiectasis, chronic respiratory failure on O2, CKD 3b, chronic diastolic HF, PE/DVT on Eliquis, hypothyroidism.   Clinical Impression  Pt admitted with above diagnosis. Required up to mod assist to rise to EOB and stand. Somewhat of a limited evaluation as she became dizzy after standing a second time, likely pre-syncopal and needed max assist to return supine in bed safely. Symptoms improved quickly with BP in supine recorded at 120/96. SpO2 maintained 94% on RA throughout session. Son present and supportive, very involved in care but needs cues not to interfere with care being provided for pt safety. Pt complains of cervical pain pre-assessment, but states it improved when repositioned better in bed at end of visit. Suspect she may need some short term rehab at SNF once stable for d/c but if she improves functionally and family feel they can provide adequate care HHPT would be helpful to aid in restoration of strength and functional independence. Will monitor and update as she improves. Pt currently with functional limitations due to the deficits listed below (see PT Problem List). Pt will benefit from acute skilled PT to increase their independence and safety with mobility to allow discharge.           If plan is discharge home, recommend the following: Two people to help with walking and/or transfers;A lot of help with bathing/dressing/bathroom;Assistance with cooking/housework;Direct supervision/assist for medications management;Direct supervision/assist for financial management;Assist for transportation;Help with stairs  or ramp for entrance   Can travel by private vehicle   No    Equipment Recommendations None recommended by PT  Recommendations for Other Services       Functional Status Assessment Patient has had a recent decline in their functional status and demonstrates the ability to make significant improvements in function in a reasonable and predictable amount of time.     Precautions / Restrictions        Mobility  Bed Mobility Overal bed mobility: Needs Assistance Bed Mobility: Rolling, Sidelying to Sit, Sit to Supine Rolling: Min assist, Used rails Sidelying to sit: Mod assist, HOB elevated, Used rails   Sit to supine: Max assist   General bed mobility comments: Min assist to roll, max cues for technique and to grasp rail as needed. Mod assist to bring LEs out of bed and support trunk into seated position. Max assist for trunk and LEs back into bed.    Transfers Overall transfer level: Needs assistance Equipment used: Rolling walker (2 wheels), 1 person hand held assist Transfers: Sit to/from Stand Sit to Stand: Mod assist           General transfer comment: Mod assist for boost to stand with hand held support followed by RW due to trunk flexion first attempt. Pt follows simple cues to rise. Became dizzy and did not attempt pivot to [facility] which was initial request/goal; appeared pre-syncopal and quickly returned to supine. She felt much better after lying down. RN notified.    Ambulation/Gait               General Gait Details: Deferred due to dizziness, appeared pre-syncopal  Stairs  Wheelchair Mobility     Tilt Bed    Modified Rankin (Stroke Patients Only)       Balance Overall balance assessment: Needs assistance Sitting-balance support: Bilateral upper extremity supported, Feet supported Sitting balance-Leahy Scale: Poor     Standing balance support: Single extremity supported Standing balance-Leahy Scale: Poor                                Pertinent Vitals/Pain Pain Assessment Pain Assessment: Faces Faces Pain Scale: Hurts even more Pain Location: posterior neck Pain Descriptors / Indicators: Aching Pain Intervention(s): Monitored during session, Repositioned    Home Living Family/patient expects to be discharged to:: Private residence Living Arrangements: Spouse/significant other;Children Available Help at Discharge: Family;Available 24 hours/day Type of Home: House Home Access: Stairs to enter Entrance Stairs-Rails: None Entrance Stairs-Number of Steps: 1   Home Layout: Two level;Able to live on main level with bedroom/bathroom Home Equipment: Rollator (4 wheels);Wheelchair - Forensic psychologist (2 wheels);Shower seat Additional Comments: Son reports, 24/7 assist available from husband and his brother who lives with patient. Prior notes reveal pt has an aide M-F 3hr    Prior Function Prior Level of Function : Independent/Modified Independent             Mobility Comments: uses rollator in home and wheelchair out of home (family pushes her). Son reports pt rarely out of the house now ADLs Comments: Reports she was independent with ADLs PTA but does very little cooking.     Extremity/Trunk Assessment   Upper Extremity Assessment Upper Extremity Assessment: Defer to OT evaluation    Lower Extremity Assessment Lower Extremity Assessment: Generalized weakness;Difficult to assess due to impaired cognition       Communication   Communication Communication: Difficulty communicating thoughts/reduced clarity of speech Cueing Techniques: Verbal cues;Tactile cues  Cognition Arousal: Alert Behavior During Therapy: Flat affect Overall Cognitive Status: Difficult to assess                                 General Comments: Minimally conversant, not answering questions consistently.        General Comments General comments (skin integrity, edema, etc.): Pre activity BP  131/91 SpO2 94% on RA. Developed dizziness, became less responsive and pale after standing. Returned to supine and BP recorded at 120/96 (symptoms resolved after lying flat.)    Exercises General Exercises - Lower Extremity Ankle Circles/Pumps: AAROM, Both, 10 reps, Supine Quad Sets: Strengthening, Both, 10 reps, Supine Gluteal Sets: Strengthening, Both, 10 reps, Supine   Assessment/Plan    PT Assessment Patient needs continued PT services  PT Problem List Decreased strength;Decreased activity tolerance;Decreased balance;Decreased mobility;Decreased cognition;Decreased knowledge of use of DME;Decreased safety awareness;Decreased knowledge of precautions;Cardiopulmonary status limiting activity;Obesity;Pain       PT Treatment Interventions DME instruction;Gait training;Stair training;Functional mobility training;Therapeutic activities;Therapeutic exercise;Balance training;Neuromuscular re-education;Cognitive remediation;Patient/family education;Wheelchair mobility training;Modalities    PT Goals (Current goals can be found in the Care Plan section)  Acute Rehab PT Goals Patient Stated Goal: none stated PT Goal Formulation: With patient/family Time For Goal Achievement: 06/30/23 Potential to Achieve Goals: Fair    Frequency Min 1X/week     Co-evaluation               AM-PAC PT "6 Clicks" Mobility  Outcome Measure Help needed turning from your back to your side while in a flat bed without  using bedrails?: A Little Help needed moving from lying on your back to sitting on the side of a flat bed without using bedrails?: A Lot Help needed moving to and from a bed to a chair (including a wheelchair)?: A Lot Help needed standing up from a chair using your arms (e.g., wheelchair or bedside chair)?: A Lot Help needed to walk in hospital room?: Total Help needed climbing 3-5 steps with a railing? : Total 6 Click Score: 11    End of Session Equipment Utilized During Treatment: Gait  belt Activity Tolerance: Patient limited by fatigue;Treatment limited secondary to medical complications (Comment) (likely orthostatic after standing but unable to obtain reading due to quickly returning pt to bed) Patient left: in bed;with call bell/phone within reach;with bed alarm set;with family/visitor present Nurse Communication: Mobility status PT Visit Diagnosis: Unsteadiness on feet (R26.81);Muscle weakness (generalized) (M62.81);Difficulty in walking, not elsewhere classified (R26.2);Other symptoms and signs involving the nervous system (R29.898);Dizziness and giddiness (R42);Pain Pain - part of body:  (posterior neck)    Time: 1610-9604 PT Time Calculation (min) (ACUTE ONLY): 33 min   Charges:   PT Evaluation $PT Eval Moderate Complexity: 1 Mod PT Treatments $Therapeutic Activity: 8-22 mins PT General Charges $$ ACUTE PT VISIT: 1 Visit         Kathlyn Sacramento, PT, DPT [facility]  Rehabilitation Services Physical Therapist Office: [phone] Website: [email or web address]   [name] 06/16/2023, 1:41 PM

## 2023-06-16 NOTE — Progress Notes (Signed)
PT Cancellation Note  Patient Details Name: [name] MRN: [id number] DOB: May 24, 1940   Cancelled Treatment:    Reason Eval/Treat Not Completed: Other (comment)  Family reports pt had a difficult restless night and is just waking up this morning. Requested PT follow up for assessment later today. Will follow-up as schedule allows.   Kathlyn Sacramento, PT, DPT [facility]  Rehabilitation Services Physical Therapist Office: [phone] Website: [email or web address]  [name] 06/16/2023, 9:23 AM

## 2023-06-16 NOTE — TOC Initial Note (Signed)
Transition of Care [facility]) - Initial/Assessment Note    Patient Details  Name: [name] MRN: [id number] Date of Birth: August 29, 1940  Transition of Care [facility]) CM/SW Contact:    Marliss Coots, LCSW Phone Number: 06/16/2023, 3:57 PM  Clinical Narrative:                  3:58 PM CSW attempted to call patient's daughter (patient disoriented x3), Azucena Kuba, regarding therapy recommendation of patient discharge to SNF but there was no response and a voicemail was left.  Expected Discharge Plan: Skilled Nursing Facility Barriers to Discharge: Continued Medical Work up   Patient Goals and CMS Choice            Expected Discharge Plan and Services In-house Referral: Clinical Social Work     Living arrangements for the past 2 months: Single Family Home                                      Prior Living Arrangements/Services Living arrangements for the past 2 months: Single Family Home Lives with:: Spouse, Adult Children Patient language and need for interpreter reviewed:: Yes        Need for Family Participation in Patient Care: Yes (Comment) Care giver support system in place?: Yes (comment)   Criminal Activity/Legal Involvement Pertinent to Current Situation/Hospitalization: No - Comment as needed  Activities of Daily Living      Permission Sought/Granted Permission sought to share information with : Family Supports Permission granted to share information with : No (Contact information on chart)  Share Information with NAME: Ronalee Belts     Permission granted to share info w Relationship: Daughter  Permission granted to share info w Contact Information: [phone]  Emotional Assessment   Attitude/Demeanor/Rapport: Unable to Assess Affect (typically observed): Unable to Assess Orientation: : Oriented to Self Alcohol / Substance Use: Not Applicable Psych Involvement: No (comment)  Admission diagnosis:  Influenza A [J10.1] RSV infection  [B33.8] Septic shock (HCC) [A41.9, R65.21] Acute on chronic respiratory failure with hypoxia (HCC) [J96.21] Patient Active Problem List   Diagnosis Date Noted   Septic shock (HCC) 06/13/2023   Lower GI bleed 12/16/2022   BRBPR (bright red blood per rectum) 12/15/2022   [name] 09/08/2022   Abnormal CT scan, sigmoid colon 04/14/2022   Colon stricture (HCC) 04/14/2022   Diverticulosis of colon without hemorrhage 04/14/2022   Pulmonary embolism (HCC) 11/15/2021   Tachycardia 10/22/2021   Pulmonary HTN (HCC)    Unspecified protein-calorie malnutrition (HCC) 04/05/2021   Pressure injury of skin 03/31/2021   UTI (urinary tract infection) 03/31/2021   Acute encephalopathy 03/30/2021   Thrombocytopenia (HCC) 03/30/2021   Diabetic neuropathy (HCC) 05/24/2019   Hypokalemia 04/21/2019   Anticoagulated 06/25/2018   Chronic pain 06/25/2018   CRI (chronic renal insufficiency), stage 3 (moderate) (HCC) 06/25/2018   Drug-induced constipation 06/18/2018   DVT (deep venous thrombosis) (HCC) 06/03/2018   Primary osteoarthritis of both knees 05/26/2018   Body mass index (BMI) of 50-59.9 in adult (HCC) 05/26/2018   Acquired hypothyroidism 05/26/2018   Chronic bilateral low back pain without sciatica 02/17/2018   Astigmatism with presbyopia, bilateral 03/26/2017   Cortical age-related cataract of both eyes 03/26/2017   Family history of glaucoma 03/26/2017   Long term current use of oral hypoglycemic drug 03/26/2017   Nuclear sclerotic cataract of both eyes 03/26/2017   Pain in  left hip 02/18/2017   Closed nondisplaced intertrochanteric fracture of left femur with delayed healing 01/14/2017   Gout 11/29/2016   Depression 11/29/2016   Closed left hip fracture (HCC) 11/29/2016   GERD (gastroesophageal reflux disease) 11/29/2016   Closed intertrochanteric fracture of hip, left, initial encounter (HCC) 11/29/2016   Physical deconditioning 07/31/2015    Pulmonary fibrosis, postinflammatory (HCC) 07/31/2015   Bronchiectasis without complication (HCC) 07/31/2015   Tendinitis of left rotator cuff 06/13/2015   Chronic diastolic CHF (congestive heart failure) (HCC) 04/21/2015   Acute kidney injury superimposed on chronic kidney disease (HCC) 04/17/2015   Chronic respiratory failure with hypoxia (HCC) 02/09/2015   Dyspnea and respiratory abnormality 01/03/2015   Normal coronary arteries 11/02/2014   Morbid obesity (HCC) 10/30/2014   Dysphagia 08/21/2014   Chronic fatigue 06/21/2014   DOE (dyspnea on exertion) 05/24/2014   Primary osteoarthritis of left knee 04/26/2014   High risk medication use 04/17/2014   Aphasia 02/12/2014   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) 01/18/2014   Lumbar facet arthropathy 01/18/2014   Bilateral edema of lower extremity 05/30/2013   Chronic cough 05/24/2013   ILD (interstitial lung disease) (HCC) 04/10/2013   Spinal stenosis of lumbar region with radiculopathy 07/12/2012   Inability to walk 07/12/2012   Meningioma of left sphenoid wing involving cavernous sinus (HCC) 02/17/2012   Chest pain of uncertain etiology 01/28/2012   Rheumatoid arthritis (HCC) 01/28/2012   Primary osteoarthritis of right knee 01/28/2012   Dyslipidemia    Thyroid disease    Essential hypertension    PCP:  [name], MD Pharmacy:   [facility] - [place], [place] - [address] [facility] [address] [place] [address] Phone: [phone] Fax: [phone]  [id number] - [place], [place] - [address] AT [facility] OF [address] [facility] [address] [place] [address] Phone: [phone] Fax: [phone]  [id number] [place], [place] - [address] AT [facility] [address] & [facility] [address] [place] [address] Phone: [phone] Fax: [phone]     Social Drivers of Health (SDOH) Social History: SDOH Screenings    Food Insecurity: No Food Insecurity (06/11/2023)  Housing: Unknown (06/11/2023)  Transportation Needs: No Transportation Needs (12/24/2022)  Utilities: Not At Risk (12/17/2022)  Recent Concern: Utilities - At Risk (12/16/2022)  Alcohol Screen: Low Risk  (10/27/2022)  Depression (PHQ2-9): High Risk (06/12/2023)  Financial Resource Strain: Low Risk  (06/11/2023)  Physical Activity: Inactive (06/11/2023)  Social Connections: Socially Integrated (06/11/2023)  Stress: No Stress Concern Present (10/27/2022)  Tobacco Use: Low Risk  (06/12/2023)   SDOH Interventions:     Readmission Risk Interventions    12/16/2022   11:09 AM  Readmission Risk Prevention Plan  Transportation Screening Complete  PCP or Specialist Appt within 5-7 Days Complete  Home Care Screening Complete  Medication Review (RN CM) Complete

## 2023-06-16 NOTE — Progress Notes (Signed)
PROGRESS NOTE    [name]  NUU:[id number] DOB: 07/19/1940 DOA: 06/13/2023 PCP: [name], MD   Brief Narrative:  This 83 year old female with PMH significant for DMT2, HTN, chronic pain syndrome on morphine, RA on chronic steroids, ILD, bronchiectasis, chronic respiratory failure on O2, CKD 3b, chronic diastolic HF, PE/DVT on Eliquis, hypothyroidism presents to [facility] ED on 1/25 with altered mental status. Patient was found to be lethargic in the morning by family and symptoms has worsened.  Patient was given Narcan twice with slight improvement. Noted to be hypotensive 76/46 and tachycardia 120s.  Tmax 100.6 F and WBC 12.3.  She was given IV antibiotics and IV fluids.  Blood cultures were obtained.  Found to be positive for flu A and RSV.  CXR with diffuse patchy LLL opacity suspicious for aspiration and minimal right base opacity.  Sats 93% on 3L [place].  CT head no acute abnormality.  Ethanol and salicylate level WNL.  Acetaminophen level 12.  UDS positive for opiates.  UA with small leukocytes.  Despite IV fluids patient remains hypotensive and was started on levophed.  Patient was admitted initially in the ICU. TRH pickup 06/15/2023.  She is off Levophed.  Assessment & Plan:   Principal Problem:   Septic shock (HCC)  Septic shock secondary to flu A / RSV pneumonia: Possible concomitant bacterial infection but seems less likely. Patient initially required Levophed support , now off Levophed. Encourage oral intake. Continue Tamiflu (plan 5 days total), Rocephin (plan 5 days), azithromycin (plan 3 days) Continue 48-hour stress dose steroids and resume home prednisone dose. Blood cultures staph epidermidis likely contaminant, urine culture 100 K gram-negative rods. Blood Pressure improved, shock resolved.  Chronic hypoxic respiratory failure: History of rheumatoid arthritis / Interstitial lung disease: Continue supplemental oxygen which is at baseline 3 L/min. She follows with  Dr. Marchelle Gearing. Hold etanercept, plaquenil, leflunomide in setting of viral infection.   Acute encephalopathy due to severe sepsis:  Patient has history of chronic pain syndrome on morphine and some improvement in GCS with Narcan;  Also w/ septic shock and PNA - Most likely etiology CT head>  No acute abnormality found. Limit sedating meds, Continue oxycodone given chronic pain. Hold flexeril, cymbalta, gabapentin, morphine.   Diabetes mellitus type II SSI and CBG monitoring Hb A1c 5.6.  Well-controlled.   Chronic diastolic CHF/ HTN: Last 2D echo in 2023 showed LVEF of 55% with grade 1 diastolic dysfunction Continue hydralazine 25 mg 3 times daily, Continue metoprolol tartrate 25 mg twice daily (takes succinate 125 mg daily), Hold home ARB Daily weights; strict I/os   AKI on CKD stage IIIa: Serum creatinine improved with IV fluids. Shock Resolved.  Encourage oral intake.   History of DVT/PE: Currently not on AC. Telemetry monitoring   Chronic anemia: H&H remains stable.  Monitor H&H.  No signs of obvious bleeding.   Hypothyroidism: Continue levothyroxine.   Hx of dementia: Consider resumption of Aricept in the future.    DVT prophylaxis: Heparin Code Status: Full code Family Communication: Daughter at bed side. Disposition Plan:    Status is: Inpatient Remains inpatient appropriate because: Severity of illness    Consultants:  None  Procedures: None  Antimicrobials:  Anti-infectives (From admission, onward)    Start     Dose/Rate Route Frequency Ordered Stop   06/14/23 1800  cefTRIAXone (ROCEPHIN) 2 g in sodium chloride 0.9 % 100 mL IVPB        2 g 200 mL/hr over 30 Minutes Intravenous Every  24 hours 06/13/23 2027 06/19/23 1759   06/14/23 1000  oseltamivir (TAMIFLU) capsule 30 mg       Placed in "Followed by" Linked Group   30 mg Oral 2 times daily 06/13/23 2034 06/19/23 0959   06/13/23 2200  oseltamivir (TAMIFLU) capsule 75 mg  Status:  Discontinued         75 mg Oral 2 times daily 06/13/23 2027 06/13/23 2034   06/13/23 2045  oseltamivir (TAMIFLU) capsule 75 mg       Placed in "Followed by" Linked Group   75 mg Oral Once 06/13/23 2034     06/13/23 2030  azithromycin (ZITHROMAX) 500 mg in sodium chloride 0.9 % 250 mL IVPB        500 mg 250 mL/hr over 60 Minutes Intravenous Every 24 hours 06/13/23 2027 06/16/23 0700   06/13/23 1800  cefTRIAXone (ROCEPHIN) 2 g in sodium chloride 0.9 % 100 mL IVPB        2 g 200 mL/hr over 30 Minutes Intravenous Once 06/13/23 1757 06/13/23 1912      Subjective: Patient was seen and examined at bedside.  Overnight events noted.   Patient reports feeling much improved. She has not slept last night so now trying to sleep. She is off Levophed, maintaining her blood pressure.  She remains on Tamiflu and antibiotics.  Objective: Vitals:   06/16/23 1015 06/16/23 1100 06/16/23 1130 06/16/23 1337  BP: (!) 156/90 (!) 152/99  (!) 166/115  Pulse: (!) 127 (!) 101    Resp: 17 (!) 23    Temp:   98.6 F (37 C)   TempSrc:   Oral   SpO2: 95% 94%    Weight:        Intake/Output Summary (Last 24 hours) at 06/16/2023 1343 Last data filed at 06/16/2023 0945 Gross per 24 hour  Intake 356 ml  Output 1050 ml  Net -694 ml   Filed Weights   06/14/23 0322 06/16/23 0500  Weight: 106.4 kg 103.8 kg    Examination:  General exam: Appears calm and comfortable, deconditioned, not in any acute distress. Respiratory system: CTA bilaterally. Respiratory effort normal.  RR 14 Cardiovascular system: S1 & S2 heard, RRR. No JVD, murmurs, rubs, gallops or clicks. No pedal edema. Gastrointestinal system: Abdomen is non distended, soft and non tender.  Normal bowel sounds heard. Central nervous system: Alert and oriented x 3. No focal neurological deficits. Extremities: No edema, no cyanosis, no clubbing Skin: No rashes, lesions or ulcers Psychiatry: Judgement and insight appear normal. Mood & affect appropriate.     Data  Reviewed: I have personally reviewed following labs and imaging studies  CBC: Recent Labs  Lab 06/13/23 1714 06/13/23 1734 06/13/23 1740 06/14/23 0315  WBC 12.3*  --   --  13.6*  NEUTROABS 7.9*  --   --   --   HGB 10.9* 11.9* 12.2 11.0*  HCT 36.3 35.0* 36.0 35.3*  MCV 101.7*  --   --  98.1  PLT 148*  --   --  139*   Basic Metabolic Panel: Recent Labs  Lab 06/13/23 1714 06/13/23 1734 06/13/23 1740 06/14/23 0315  NA 139 139 138 136  K 3.6 3.5 3.8 3.5  CL 100  --  100 102  CO2 26  --   --  26  GLUCOSE 118*  --  109* 163*  BUN 13  --  17 12  CREATININE 1.25*  --  1.30* 1.12*  CALCIUM 8.2*  --   --  8.0*  MG  --   --   --  1.7  PHOS  --   --   --  3.2   GFR: Estimated Creatinine Clearance: 46.2 mL/min (A) (by C-G formula based on SCr of 1.12 mg/dL (H)). Liver Function Tests: Recent Labs  Lab 06/13/23 1714  AST 23  ALT 19  ALKPHOS 72  BILITOT 0.4  PROT 5.3*  ALBUMIN 2.8*   No results for input(s): "LIPASE", "AMYLASE" in the last 168 hours. Recent Labs  Lab 06/14/23 0023  AMMONIA 12   Coagulation Profile: Recent Labs  Lab 06/14/23 0023  INR 1.1   Cardiac Enzymes: Recent Labs  Lab 06/13/23 1714  CKTOTAL 38   BNP (last 3 results) No results for input(s): "PROBNP" in the last 8760 hours. HbA1C: Recent Labs    06/14/23 0316  HGBA1C 5.6   CBG: Recent Labs  Lab 06/15/23 2250 06/15/23 2337 06/16/23 0335 06/16/23 0738 06/16/23 1132  GLUCAP 62* 125* 79 94 107*   Lipid Profile: No results for input(s): "CHOL", "HDL", "LDLCALC", "TRIG", "CHOLHDL", "LDLDIRECT" in the last 72 hours. Thyroid Function Tests: Recent Labs    06/14/23 0023  TSH 0.550   Anemia Panel: No results for input(s): "VITAMINB12", "FOLATE", "FERRITIN", "TIBC", "IRON", "RETICCTPCT" in the last 72 hours. Sepsis Labs: Recent Labs  Lab 06/13/23 1939 06/14/23 0023 06/14/23 0139 06/14/23 0456  PROCALCITON  --  0.20  --   --   LATICACIDVEN 2.1* 1.9 1.1 1.0    Recent  Results (from the past 240 hours)  Resp panel by RT-PCR (RSV, Flu A&B, Covid) Urine, Catheterized     Status: Abnormal   Collection Time: 06/13/23  5:14 PM   Specimen: Urine, Catheterized; Nasal Swab  Result Value Ref Range Status   SARS Coronavirus 2 by RT PCR NEGATIVE NEGATIVE Final   Influenza A by PCR POSITIVE (A) NEGATIVE Final   Influenza B by PCR NEGATIVE NEGATIVE Final    Comment: (NOTE) The Xpert Xpress SARS-CoV-2/FLU/RSV plus assay is intended as an aid in the diagnosis of influenza from Nasopharyngeal swab specimens and should not be used as a sole basis for treatment. Nasal washings and aspirates are unacceptable for Xpert Xpress SARS-CoV-2/FLU/RSV testing.  Fact Sheet for Patients: BloggerCourse.com  Fact Sheet for Healthcare Providers: SeriousBroker.it  This test is not yet approved or cleared by the [place] FDA and has been authorized for detection and/or diagnosis of SARS-CoV-2 by FDA under an Emergency Use Authorization (EUA). This EUA will remain in effect (meaning this test can be used) for the duration of the COVID-19 declaration under Section 564(b)(1) of the Act, 21 U.S.C. section 360bbb-3(b)(1), unless the authorization is terminated or revoked.     Resp Syncytial Virus by PCR POSITIVE (A) NEGATIVE Final    Comment: (NOTE) Fact Sheet for Patients: BloggerCourse.com  Fact Sheet for Healthcare Providers: SeriousBroker.it  This test is not yet approved or cleared by the [place] FDA and has been authorized for detection and/or diagnosis of SARS-CoV-2 by FDA under an Emergency Use Authorization (EUA). This EUA will remain in effect (meaning this test can be used) for the duration of the COVID-19 declaration under Section 564(b)(1) of the Act, 21 U.S.C. section 360bbb-3(b)(1), unless the authorization is terminated or revoked.  Performed at [facility], [address], [place], [place] [address]   Urine Culture     Status: Abnormal   Collection Time: 06/13/23  5:14 PM   Specimen: Urine, Random  Result Value Ref  Range Status   Specimen Description URINE, RANDOM  Final   Special Requests   Final    URINE, CATHETERIZED Performed at [facility], [address] [place], [place] [address]    Culture (A)  Final    >=100,000 COLONIES/mL KLEBSIELLA PNEUMONIAE >=100,000 COLONIES/mL ESCHERICHIA COLI    Report Status 06/16/2023 FINAL  Final   Organism ID, Bacteria KLEBSIELLA PNEUMONIAE (A)  Final   Organism ID, Bacteria ESCHERICHIA COLI (A)  Final      Susceptibility   Escherichia coli - MIC*    AMPICILLIN >=32 RESISTANT Resistant     CEFAZOLIN <=4 SENSITIVE Sensitive     CEFEPIME <=0.12 SENSITIVE Sensitive     CEFTRIAXONE <=0.25 SENSITIVE Sensitive     CIPROFLOXACIN 0.5 INTERMEDIATE Intermediate     GENTAMICIN <=1 SENSITIVE Sensitive     IMIPENEM <=0.25 SENSITIVE Sensitive     NITROFURANTOIN <=16 SENSITIVE Sensitive     TRIMETH/SULFA >=320 RESISTANT Resistant     AMPICILLIN/SULBACTAM <=2 SENSITIVE Sensitive     PIP/TAZO <=4 SENSITIVE Sensitive ug/mL    * >=100,000 COLONIES/mL ESCHERICHIA COLI   Klebsiella pneumoniae - MIC*    AMPICILLIN >=32 RESISTANT Resistant     CEFAZOLIN <=4 SENSITIVE Sensitive     CEFEPIME <=0.12 SENSITIVE Sensitive     CEFTRIAXONE <=0.25 SENSITIVE Sensitive     CIPROFLOXACIN <=0.25 SENSITIVE Sensitive     GENTAMICIN <=1 SENSITIVE Sensitive     IMIPENEM <=0.25 SENSITIVE Sensitive     NITROFURANTOIN 128 RESISTANT Resistant     TRIMETH/SULFA <=20 SENSITIVE Sensitive     AMPICILLIN/SULBACTAM 8 SENSITIVE Sensitive     PIP/TAZO 8 SENSITIVE Sensitive ug/mL    * >=100,000 COLONIES/mL KLEBSIELLA PNEUMONIAE  Blood culture (routine x 2)     Status: Abnormal   Collection Time: 06/13/23  5:27 PM   Specimen: BLOOD  Result Value Ref Range Status   Specimen Description BLOOD SITE NOT SPECIFIED   Final   Special Requests   Final    BOTTLES DRAWN AEROBIC AND ANAEROBIC Blood Culture results may not be optimal due to an inadequate volume of blood received in culture bottles   Culture  Setup Time   Final    GRAM POSITIVE COCCI AEROBIC BOTTLE ONLY CRITICAL RESULT CALLED TO, READ BACK BY AND VERIFIED WITH: PHARMD JENNIE ZHOU [id number] AT 1440, ADC    Culture (A)  Final    STAPHYLOCOCCUS WARNERI THE SIGNIFICANCE OF ISOLATING THIS ORGANISM FROM A SINGLE SET OF BLOOD CULTURES WHEN MULTIPLE SETS ARE DRAWN IS UNCERTAIN. PLEASE NOTIFY THE MICROBIOLOGY DEPARTMENT WITHIN ONE WEEK IF SPECIATION AND SENSITIVITIES ARE REQUIRED. Performed at [facility], [address] [place], [place] [address]    Report Status 06/16/2023 FINAL  Final  Blood culture (routine x 2)     Status: Abnormal   Collection Time: 06/13/23  5:27 PM   Specimen: BLOOD  Result Value Ref Range Status   Specimen Description BLOOD SITE NOT SPECIFIED  Final   Special Requests   Final    BOTTLES DRAWN AEROBIC AND ANAEROBIC Blood Culture results may not be optimal due to an inadequate volume of blood received in culture bottles   Culture  Setup Time   Final    GRAM POSITIVE COCCI AEROBIC BOTTLE ONLY CRITICAL VALUE NOTED.  VALUE IS CONSISTENT WITH PREVIOUSLY REPORTED AND CALLED VALUE.    Culture (A)  Final    STAPHYLOCOCCUS EPIDERMIDIS THE SIGNIFICANCE OF ISOLATING THIS ORGANISM FROM A SINGLE SET OF BLOOD CULTURES WHEN MULTIPLE  SETS ARE DRAWN IS UNCERTAIN. PLEASE NOTIFY THE MICROBIOLOGY DEPARTMENT WITHIN ONE WEEK IF SPECIATION AND SENSITIVITIES ARE REQUIRED. Performed at [facility], [address] [place], [place] [address]    Report Status 06/16/2023 FINAL  Final  Blood Culture ID Panel (Reflexed)     Status: Abnormal   Collection Time: 06/13/23  5:27 PM  Result Value Ref Range Status   Enterococcus faecalis NOT DETECTED NOT DETECTED Final   Enterococcus Faecium NOT DETECTED NOT DETECTED Final   Listeria  monocytogenes NOT DETECTED NOT DETECTED Final   Staphylococcus species DETECTED (A) NOT DETECTED Final    Comment: CRITICAL RESULT CALLED TO, READ BACK BY AND VERIFIED WITH: PHARMD JENNIE ZHOU [id number] AT 1440, ADC    Staphylococcus aureus (BCID) NOT DETECTED NOT DETECTED Final   Staphylococcus epidermidis NOT DETECTED NOT DETECTED Final   Staphylococcus lugdunensis NOT DETECTED NOT DETECTED Final   Streptococcus species NOT DETECTED NOT DETECTED Final   Streptococcus agalactiae NOT DETECTED NOT DETECTED Final   Streptococcus pneumoniae NOT DETECTED NOT DETECTED Final   Streptococcus pyogenes NOT DETECTED NOT DETECTED Final   A.calcoaceticus-baumannii NOT DETECTED NOT DETECTED Final   Bacteroides fragilis NOT DETECTED NOT DETECTED Final   Enterobacterales NOT DETECTED NOT DETECTED Final   Enterobacter cloacae complex NOT DETECTED NOT DETECTED Final   Escherichia coli NOT DETECTED NOT DETECTED Final   Klebsiella aerogenes NOT DETECTED NOT DETECTED Final   Klebsiella oxytoca NOT DETECTED NOT DETECTED Final   Klebsiella pneumoniae NOT DETECTED NOT DETECTED Final   Proteus species NOT DETECTED NOT DETECTED Final   Salmonella species NOT DETECTED NOT DETECTED Final   Serratia marcescens NOT DETECTED NOT DETECTED Final   Haemophilus influenzae NOT DETECTED NOT DETECTED Final   Neisseria meningitidis NOT DETECTED NOT DETECTED Final   Pseudomonas aeruginosa NOT DETECTED NOT DETECTED Final   Stenotrophomonas maltophilia NOT DETECTED NOT DETECTED Final   Candida albicans NOT DETECTED NOT DETECTED Final   Candida auris NOT DETECTED NOT DETECTED Final   Candida glabrata NOT DETECTED NOT DETECTED Final   Candida krusei NOT DETECTED NOT DETECTED Final   Candida parapsilosis NOT DETECTED NOT DETECTED Final   Candida tropicalis NOT DETECTED NOT DETECTED Final   Cryptococcus neoformans/gattii NOT DETECTED NOT DETECTED Final    Comment: Performed at [facility], [address], [place],  [place] [address]  MRSA Next Gen by PCR, Nasal     Status: None   Collection Time: 06/13/23 10:12 PM   Specimen: Nasal Mucosa; Nasal Swab  Result Value Ref Range Status   MRSA by PCR Next Gen NOT DETECTED NOT DETECTED Final    Comment: (NOTE) The GeneXpert MRSA Assay (FDA approved for NASAL specimens only), is one component of a comprehensive MRSA colonization surveillance program. It is not intended to diagnose MRSA infection nor to guide or monitor treatment for MRSA infections. Test performance is not FDA approved in patients less than 64 years old. Performed at [facility], [address], [place], [place] [address]   Respiratory (~20 pathogens) panel by PCR     Status: Abnormal   Collection Time: 06/15/23 10:28 AM   Specimen: Nasopharyngeal Swab; Respiratory  Result Value Ref Range Status   Adenovirus NOT DETECTED NOT DETECTED Final   Coronavirus 229E NOT DETECTED NOT DETECTED Final    Comment: (NOTE) The Coronavirus on the Respiratory Panel, DOES NOT test for the novel  Coronavirus (2019 nCoV)    Coronavirus HKU1 NOT DETECTED NOT DETECTED Final  Coronavirus NL63 NOT DETECTED NOT DETECTED Final   Coronavirus OC43 NOT DETECTED NOT DETECTED Final   Metapneumovirus NOT DETECTED NOT DETECTED Final   Rhinovirus / Enterovirus NOT DETECTED NOT DETECTED Final   Influenza A H1 2009 DETECTED (A) NOT DETECTED Final   Influenza B NOT DETECTED NOT DETECTED Final   Parainfluenza Virus 1 NOT DETECTED NOT DETECTED Final   Parainfluenza Virus 2 NOT DETECTED NOT DETECTED Final   Parainfluenza Virus 3 NOT DETECTED NOT DETECTED Final   Parainfluenza Virus 4 NOT DETECTED NOT DETECTED Final   Respiratory Syncytial Virus DETECTED (A) NOT DETECTED Final   Bordetella pertussis NOT DETECTED NOT DETECTED Final   Bordetella Parapertussis NOT DETECTED NOT DETECTED Final   Chlamydophila pneumoniae NOT DETECTED NOT DETECTED Final   Mycoplasma pneumoniae NOT DETECTED NOT DETECTED Final    Comment:  Performed at [facility], [address] [place], [place] [address]    Radiology Studies: ECHOCARDIOGRAM COMPLETE Result Date: 06/15/2023    ECHOCARDIOGRAM REPORT   Patient Name:   [name] Date of Exam: 06/15/2023 Medical Rec #:  [id number]         Height:       64.0 in Accession #:    [id number]        Weight:       234.6 lb Date of Birth:  15-May-1941         BSA:          2.094 m Patient Age:    82 years          BP:           161/86 mmHg Patient Gender: F                 HR:           88 bpm. Exam Location:  Inpatient Procedure: 2D Echo, Cardiac Doppler and Color Doppler Indications:    Shock R57.9  History:        Patient has prior history of Echocardiogram examinations, most                 recent 11/06/2021. Risk Factors:Hypertension, Diabetes,                 Dyslipidemia and Non-Smoker.  Sonographer:    Dondra Prader RVT RCS Referring Phys: [id number] Patrici Ranks  Sonographer Comments: Image acquisition challenging due to uncooperative patient. Patient asked for exam to end. Patient unable to lie still. IMPRESSIONS  1. Left ventricular ejection fraction, by estimation, is 60 to 65%. The left ventricle has normal function. The left ventricle has no regional wall motion abnormalities. Left ventricular diastolic parameters are consistent with Grade I diastolic dysfunction (impaired relaxation).  2. Right ventricular systolic function is normal. The right ventricular size is normal. Tricuspid regurgitation signal is inadequate for assessing PA pressure.  3. The mitral valve is normal in structure. No evidence of mitral valve regurgitation. No evidence of mitral stenosis.  4. The aortic valve is tricuspid. There is mild calcification of the aortic valve. Aortic valve regurgitation is not visualized. No aortic stenosis is present.  5. IVC not visualized.  6. Exam truncated due to patient noncompliance. FINDINGS  Left Ventricle: Left ventricular ejection fraction, by estimation, is 60 to 65%. The  left ventricle has normal function. The left ventricle has no regional wall motion abnormalities. The left ventricular internal cavity size was normal in size. There is  no left ventricular hypertrophy. Left ventricular diastolic  parameters are consistent with Grade I diastolic dysfunction (impaired relaxation). Right Ventricle: The right ventricular size is normal. No increase in right ventricular wall thickness. Right ventricular systolic function is normal. Tricuspid regurgitation signal is inadequate for assessing PA pressure. Left Atrium: Left atrial size was normal in size. Right Atrium: Right atrial size was normal in size. Pericardium: There is no evidence of pericardial effusion. Mitral Valve: The mitral valve is normal in structure. No evidence of mitral valve regurgitation. No evidence of mitral valve stenosis. Tricuspid Valve: The tricuspid valve is normal in structure. Tricuspid valve regurgitation is not demonstrated. Aortic Valve: The aortic valve is tricuspid. There is mild calcification of the aortic valve. Aortic valve regurgitation is not visualized. No aortic stenosis is present. Aortic valve mean gradient measures 5.0 mmHg. Aortic valve peak gradient measures 9.1 mmHg. Aortic valve area, by VTI measures 1.81 cm. Pulmonic Valve: The pulmonic valve was normal in structure. Pulmonic valve regurgitation is not visualized. Aorta: The aortic root is normal in size and structure. Venous: The inferior vena cava was not well visualized. IAS/Shunts: No atrial level shunt detected by color flow Doppler.  LEFT VENTRICLE PLAX 2D LVIDd:         4.20 cm   Diastology LVIDs:         2.70 cm   LV e' medial:    5.51 cm/s LV PW:         1.20 cm   LV E/e' medial:  14.1 LV IVS:        0.90 cm   LV e' lateral:   6.57 cm/s LVOT diam:     1.70 cm   LV E/e' lateral: 11.9 LV SV:         47 LV SV Index:   23 LVOT Area:     2.27 cm  RIGHT VENTRICLE RV S prime:     14.50 cm/s LEFT ATRIUM             Index        RIGHT  ATRIUM          Index LA diam:        2.70 cm 1.29 cm/m   RA Area:     9.72 cm LA Vol (A2C):   40.9 ml 19.54 ml/m  RA Volume:   21.60 ml 10.32 ml/m LA Vol (A4C):   31.3 ml 14.93 ml/m LA Biplane Vol: 42.8 ml 20.44 ml/m  AORTIC VALVE AV Area (Vmax):    2.04 cm AV Area (Vmean):   1.87 cm AV Area (VTI):     1.81 cm AV Vmax:           151.00 cm/s AV Vmean:          102.000 cm/s AV VTI:            0.261 m AV Peak Grad:      9.1 mmHg AV Mean Grad:      5.0 mmHg LVOT Vmax:         136.00 cm/s LVOT Vmean:        83.900 cm/s LVOT VTI:          0.208 m LVOT/AV VTI ratio: 0.80  AORTA Ao Root diam: 2.90 cm Ao Asc diam:  3.40 cm MITRAL VALVE MV Area (PHT): 5.54 cm    SHUNTS MV Decel Time: 137 msec    Systemic VTI:  0.21 m MV E velocity: 77.90 cm/s  Systemic Diam: 1.70 cm MV A velocity: 81.80 cm/s MV E/A ratio:  0.95 Dalton McleanMD Electronically signed by Wilfred Lacy Signature Date/Time: 06/15/2023/10:38:49 AM    Final    Scheduled Meds:  Chlorhexidine Gluconate Cloth  6 each Topical Daily   heparin  5,000 Units Subcutaneous Q8H   hydrALAZINE  25 mg Oral Q8H   insulin aspart  0-6 Units Subcutaneous TID WC   levothyroxine  50 mcg Oral Daily   metoprolol tartrate  50 mg Oral BID   morphine  15 mg Oral Q12H   oseltamivir  75 mg Oral Once   Followed by   oseltamivir  30 mg Oral BID   pantoprazole (PROTONIX) IV  40 mg Intravenous Q24H   polyethylene glycol  17 g Oral BID   predniSONE  10 mg Oral Q breakfast   senna-docusate  2 tablet Oral BID   Continuous Infusions:  cefTRIAXone (ROCEPHIN)  IV Stopped (06/15/23 1817)   lactated ringers       LOS: 3 days    Time spent: 35 Mins    Willeen Niece, MD [facility]   If 7PM-7AM, please contact night-coverage

## 2023-06-17 ENCOUNTER — Inpatient Hospital Stay (HOSPITAL_COMMUNITY): Payer: Medicare Other

## 2023-06-17 ENCOUNTER — Encounter (HOSPITAL_COMMUNITY): Payer: Self-pay | Admitting: Pulmonary Disease

## 2023-06-17 ENCOUNTER — Other Ambulatory Visit: Payer: Self-pay

## 2023-06-17 DIAGNOSIS — R6521 Severe sepsis with septic shock: Secondary | ICD-10-CM | POA: Diagnosis not present

## 2023-06-17 DIAGNOSIS — A419 Sepsis, unspecified organism: Secondary | ICD-10-CM | POA: Diagnosis not present

## 2023-06-17 LAB — CBC
HCT: 32.1 % — ABNORMAL LOW (ref 36.0–46.0)
Hemoglobin: 10.2 g/dL — ABNORMAL LOW (ref 12.0–15.0)
MCH: 30.1 pg (ref 26.0–34.0)
MCHC: 31.8 g/dL (ref 30.0–36.0)
MCV: 94.7 fL (ref 80.0–100.0)
Platelets: 123 10*3/uL — ABNORMAL LOW (ref 150–400)
RBC: 3.39 MIL/uL — ABNORMAL LOW (ref 3.87–5.11)
RDW: 14.6 % (ref 11.5–15.5)
WBC: 9.7 10*3/uL (ref 4.0–10.5)
nRBC: 0 % (ref 0.0–0.2)

## 2023-06-17 LAB — BASIC METABOLIC PANEL
Anion gap: 8 (ref 5–15)
BUN: 8 mg/dL (ref 8–23)
CO2: 25 mmol/L (ref 22–32)
Calcium: 7.6 mg/dL — ABNORMAL LOW (ref 8.9–10.3)
Chloride: 103 mmol/L (ref 98–111)
Creatinine, Ser: 0.63 mg/dL (ref 0.44–1.00)
GFR, Estimated: >60 mL/min (ref >60)
Glucose, Bld: 93 mg/dL (ref 70–99)
Potassium: 2.8 mmol/L — ABNORMAL LOW (ref 3.5–5.1)
Sodium: 136 mmol/L (ref 135–145)

## 2023-06-17 LAB — GLUCOSE, CAPILLARY
Glucose-Capillary: 91 mg/dL (ref 70–99)
Glucose-Capillary: 146 mg/dL — ABNORMAL HIGH (ref 70–99)
Glucose-Capillary: 124 mg/dL — ABNORMAL HIGH (ref 70–99)

## 2023-06-17 LAB — PROCALCITONIN: Procalcitonin: 0.15 ng/mL

## 2023-06-17 LAB — BRAIN NATRIURETIC PEPTIDE: B Natriuretic Peptide: 66.3 pg/mL (ref 0.0–100.0)

## 2023-06-17 LAB — MAGNESIUM: Magnesium: 1.8 mg/dL (ref 1.7–2.4)

## 2023-06-17 LAB — C-REACTIVE PROTEIN: CRP: 10.2 mg/dL — ABNORMAL HIGH (ref <1.0)

## 2023-06-17 MED ORDER — GUAIFENESIN-DM 100-10 MG/5ML PO SYRP
15.0000 mL | ORAL_SOLUTION | Freq: Four times a day (QID) | ORAL | Status: DC
  Administered 2023-06-17 – 2023-06-18 (×5): 15 mL via ORAL
  Filled 2023-06-17 (×6): qty 15

## 2023-06-17 MED ORDER — POTASSIUM CHLORIDE CRYS ER 20 MEQ PO TBCR
40.0000 meq | EXTENDED_RELEASE_TABLET | Freq: Two times a day (BID) | ORAL | Status: AC
  Administered 2023-06-17: 40 meq via ORAL
  Filled 2023-06-17: qty 2

## 2023-06-17 MED ORDER — AZITHROMYCIN 500 MG PO TABS
250.0000 mg | ORAL_TABLET | ORAL | Status: DC
  Administered 2023-06-17 – 2023-06-19 (×2): 250 mg via ORAL
  Filled 2023-06-17 (×2): qty 1

## 2023-06-17 MED ORDER — AMLODIPINE BESYLATE 10 MG PO TABS
10.0000 mg | ORAL_TABLET | Freq: Every day | ORAL | Status: DC
  Administered 2023-06-17 – 2023-06-20 (×4): 10 mg via ORAL
  Filled 2023-06-17 (×4): qty 1

## 2023-06-17 MED ORDER — DONEPEZIL HCL 10 MG PO TABS
10.0000 mg | ORAL_TABLET | Freq: Every day | ORAL | Status: DC
  Administered 2023-06-17 – 2023-06-25 (×9): 10 mg via ORAL
  Filled 2023-06-17 (×9): qty 1

## 2023-06-17 MED ORDER — POTASSIUM CHLORIDE 10 MEQ/100ML IV SOLN
10.0000 meq | INTRAVENOUS | Status: AC
Start: 2023-06-17 — End: 2023-06-17
  Administered 2023-06-17 (×4): 10 meq via INTRAVENOUS
  Filled 2023-06-17 (×4): qty 100

## 2023-06-17 MED ORDER — PANTOPRAZOLE SODIUM 40 MG PO TBEC
40.0000 mg | DELAYED_RELEASE_TABLET | Freq: Every day | ORAL | Status: DC
  Administered 2023-06-17 – 2023-06-26 (×10): 40 mg via ORAL
  Filled 2023-06-17 (×11): qty 1

## 2023-06-17 MED ORDER — ALLOPURINOL 300 MG PO TABS
300.0000 mg | ORAL_TABLET | Freq: Every day | ORAL | Status: DC
  Administered 2023-06-17 – 2023-06-26 (×10): 300 mg via ORAL
  Filled 2023-06-17 (×10): qty 1

## 2023-06-17 MED ORDER — PREDNISONE 20 MG PO TABS
20.0000 mg | ORAL_TABLET | Freq: Every day | ORAL | Status: DC
  Administered 2023-06-18 – 2023-06-26 (×9): 20 mg via ORAL
  Filled 2023-06-17 (×9): qty 1

## 2023-06-17 MED ORDER — HYDRALAZINE HCL 20 MG/ML IJ SOLN
10.0000 mg | Freq: Four times a day (QID) | INTRAMUSCULAR | Status: DC | PRN
  Filled 2023-06-17 (×2): qty 1

## 2023-06-17 MED ORDER — FLUTICASONE PROPIONATE 50 MCG/ACT NA SUSP
2.0000 | Freq: Every day | NASAL | Status: DC | PRN
  Administered 2023-06-24: 2 via NASAL
  Filled 2023-06-17: qty 16

## 2023-06-17 MED ORDER — POTASSIUM CHLORIDE CRYS ER 20 MEQ PO TBCR: 40.0000 meq | EXTENDED_RELEASE_TABLET | Freq: Two times a day (BID) | ORAL | Status: DC

## 2023-06-17 MED ORDER — OSELTAMIVIR PHOSPHATE 75 MG PO CAPS
75.0000 mg | ORAL_CAPSULE | Freq: Two times a day (BID) | ORAL | Status: AC
  Administered 2023-06-17 – 2023-06-18 (×3): 75 mg via ORAL
  Filled 2023-06-17 (×4): qty 1

## 2023-06-17 MED ORDER — IPRATROPIUM-ALBUTEROL 0.5-2.5 (3) MG/3ML IN SOLN
3.0000 mL | Freq: Four times a day (QID) | RESPIRATORY_TRACT | Status: DC
  Administered 2023-06-17 – 2023-06-18 (×6): 3 mL via RESPIRATORY_TRACT
  Filled 2023-06-17 (×7): qty 3

## 2023-06-17 MED ORDER — ATORVASTATIN CALCIUM 10 MG PO TABS
10.0000 mg | ORAL_TABLET | Freq: Every day | ORAL | Status: DC
  Administered 2023-06-17 – 2023-06-20 (×4): 10 mg via ORAL
  Filled 2023-06-17 (×4): qty 1

## 2023-06-17 MED ORDER — DOCUSATE SODIUM 100 MG PO CAPS
200.0000 mg | ORAL_CAPSULE | Freq: Two times a day (BID) | ORAL | Status: DC
  Administered 2023-06-17 – 2023-06-18 (×2): 200 mg via ORAL
  Filled 2023-06-17 (×4): qty 2

## 2023-06-17 MED ORDER — DM-GUAIFENESIN ER 30-600 MG PO TB12
1.0000 | ORAL_TABLET | Freq: Two times a day (BID) | ORAL | Status: DC
  Filled 2023-06-17: qty 1

## 2023-06-17 MED ORDER — MORPHINE SULFATE (PF) 2 MG/ML IV SOLN
2.0000 mg | INTRAVENOUS | Status: DC | PRN
  Administered 2023-06-17 – 2023-06-18 (×2): 2 mg via INTRAVENOUS
  Filled 2023-06-17 (×3): qty 1

## 2023-06-17 MED ORDER — ENSURE ENLIVE PO LIQD
237.0000 mL | Freq: Two times a day (BID) | ORAL | Status: DC
  Administered 2023-06-17 – 2023-06-26 (×10): 237 mL via ORAL

## 2023-06-17 NOTE — Plan of Care (Signed)

## 2023-06-17 NOTE — Progress Notes (Signed)
TR[facility] night cross cover note:   I was notified by RN that the patient's potassium level this morning is 2.8.  There is an existing order for potassium chloride 40 meq p.o. twice daily, although the patient's RN conveys that the patient is on somnolent  side this morning, and recommends IV route of administration.  Subsequently I ordered potassium chloride 40 meq IV over 4 hours, and I will retime the existing PO potassium order for next to occur later this AM.      Newton Pigg, DO Hospitalist

## 2023-06-17 NOTE — Progress Notes (Signed)
NSG 0730:  Pt incontinent of a very large amount of urine at shift change.  Pt bathed, incontinent care provided, new sacral dressing applied.  Complete bed linen change, gown change, and new purewick placed.

## 2023-06-17 NOTE — Progress Notes (Signed)
PROGRESS NOTE                                                                                                                                                                                                             Patient Demographics:    [name], is a 83 y.o. female, DOB - 02-Aug-1940, UEA:[id number]  Outpatient Primary MD for the patient is [name], MD    LOS - 4  Admit date - 06/13/2023    Chief Complaint  Patient presents with   Drug Overdose       Brief Narrative (HPI from H&P)   83 year old female with PMH significant for DMT2, HTN, chronic pain syndrome on morphine, RA on chronic steroids, ILD 3 L nasal cannula oxygen chronically, bronchiectasis, chronic respiratory failure on O2, CKD 3b, chronic diastolic HF, PE/DVT on Eliquis, hypothyroidism presents to [facility] ED on 1/25 with altered mental status, for suggestive of sepsis due to RSV pneumonia she was admitted to ICU on pressors stabilized and transferred to my care on 06/17/2023.   Subjective:    [name] today has, No headache, No chest pain, No abdominal pain - No Nausea, No new weakness tingling or numbness, no SOB   Assessment  & Plan :    Septic shock secondary to flu A / RSV pneumonia: Possible concomitant bacterial infection but seems less likely.  [name] admitted to ICU required Levophed, now stabilized sepsis pathophysiology has resolved, finishing her Tamiflu course, also finishing 5 days of Rocephin, chronically on azithromycin at baseline which will be continued.  Blood cultures with 2 different staph species in 2 different sets likely contamination.  Urine culture noted sensitive to Rocephin which should suffice.  Overall much better, advance activity, PT OT, titrate down oxygen.  Encouraged to use I-S and flutter valve for pulmonary toiletry.   Chronic hypoxic respiratory failure: ILD.  History of rheumatoid arthritis.   Chronically on 3 L nasal cannula oxygen. History of rheumatoid arthritis / Interstitial lung disease: She follows with Dr. Marchelle Gearing. Hold etanercept, plaquenil, leflunomide in setting of viral infection.  New home oral steroid at slightly higher dose due to acute stress, supportive care and monitor.   Acute encephalopathy due to severe sepsis:  CT head nonacute, no headache or focal deficits.  With supportive care mentation has improved.  Continue to hold Flexeril,  Cymbalta and gabapentin.  Continue oxycodone and chronic morphine for now with caution.  Narcan on board.   Chronic diastolic CHF/ HTN: Last 2D echo in 2023 showed LVEF of 55% with grade 1 diastolic dysfunction Continue hydralazine 25 mg 3 times daily, continue beta-blocker, as needed hydralazine IV for blood pressure control.  See below.    AKI on CKD stage IIIa: Serum creatinine improved with IV fluids. Shock Resolved.  Encourage oral intake.  Resume home diuretics, ARB on 06/18/2023 if stable.   History of DVT/PE: Currently not on AC. Telemetry monitoring   Chronic anemia: H&H remains stable.  Monitor H&H.  No signs of obvious bleeding.   Hypothyroidism: Continue levothyroxine.   Hypertension.  Currently on combination of beta-blocker, hydralazine.  Will add low-dose Norvasc and monitor.  As needed hydralazine IV also added.    Hx of dementia: Aricept resumed.   Diabetes mellitus type II SSI and CBG monitoring Hb A1c 5.6.  Well-controlled.  Lab Results  Component Value Date   HGBA1C 5.6 06/14/2023   CBG (last 3)  Recent Labs    06/16/23 1613 06/16/23 2155 06/17/23 0745  GLUCAP 122* 107* 91        Condition - Extremely Guarded  Family Communication  :  daughter bedside 06/17/23  Code Status :  Full  Consults  :  PCCM  PUD Prophylaxis : PPI   Procedures  :     TTE -  1. Left ventricular ejection fraction, by estimation, is 60 to 65%. The left ventricle has normal function. The left ventricle has  no regional wall motion abnormalities. Left ventricular diastolic parameters are consistent with Grade I diastolic dysfunction (impaired relaxation).  2. Right ventricular systolic function is normal. The right ventricular size is normal. Tricuspid regurgitation signal is inadequate for assessing PA pressure.  3. The mitral valve is normal in structure. No evidence of mitral valve regurgitation. No evidence of mitral stenosis.  4. The aortic valve is tricuspid. There is mild calcification of the aortic valve. Aortic valve regurgitation is not visualized. No aortic stenosis is present.  5. IVC not visualized.  6. Exam truncated due to patient noncompliance  CT Head - Non acute      Disposition Plan  :    Status is: Inpatient  DVT Prophylaxis  :    heparin injection 5,000 Units Start: 06/13/23 2200    Lab Results  Component Value Date   PLT 123 (L) 06/17/2023    Diet :  Diet Order             DIET SOFT Room service appropriate? Yes; Fluid consistency: Thin  Diet effective now                    Inpatient Medications  Scheduled Meds:  allopurinol  300 mg Oral Daily   atorvastatin  10 mg Oral QHS   azithromycin  250 mg Oral Q M,W,F   Chlorhexidine Gluconate Cloth  6 each Topical Daily   docusate sodium  200 mg Oral BID   donepezil  10 mg Oral QHS   heparin  5,000 Units Subcutaneous Q8H   hydrALAZINE  50 mg Oral Q8H   insulin aspart  0-6 Units Subcutaneous TID WC   levothyroxine  50 mcg Oral Daily   metoprolol tartrate  50 mg Oral BID   morphine  15 mg Oral Q12H   oseltamivir  75 mg Oral Once   Followed by   oseltamivir  30 mg Oral BID  pantoprazole (PROTONIX) IV  40 mg Intravenous Q24H   polyethylene glycol  17 g Oral BID   potassium chloride  40 mEq Oral BID   [START ON 06/18/2023] predniSONE  20 mg Oral Q breakfast   senna-docusate  2 tablet Oral BID   Continuous Infusions:  cefTRIAXone (ROCEPHIN)  IV 200 mL/hr at 06/16/23 2000   lactated ringers      potassium chloride 10 mEq (06/17/23 0650)   PRN Meds:.acetaminophen **OR** acetaminophen, fluticasone, hydrALAZINE, ipratropium-albuterol, morphine injection, naLOXone (NARCAN)  injection, ondansetron (ZOFRAN) IV, mouth rinse, oxyCODONE     Objective:   Vitals:   06/17/23 0402 06/17/23 0427 06/17/23 0500 06/17/23 0800  BP: (!) 157/93 (!) 161/87  (!) 170/89  Pulse: 98 (!) 101  (!) 109  Resp: 16 17  17   Temp:  98.7 F (37.1 C)  99.4 F (37.4 C)  TempSrc:  Oral  Oral  SpO2: 98% 97%  98%  Weight:   104.9 kg   Height:        Wt Readings from Last 3 Encounters:  06/17/23 104.9 kg  06/12/23 103.1 kg  06/02/23 105.9 kg     Intake/Output Summary (Last 24 hours) at 06/17/2023 0951 Last data filed at 06/16/2023 2100 Gross per 24 hour  Intake 100.07 ml  Output --  Net 100.07 ml     Physical Exam  Awake Alert, No new F.N deficits, Normal affect [place].AT,PERRAL Supple Neck, No JVD,   Symmetrical Chest wall movement, Good air movement bilaterally, CTAB RRR,No Gallops,Rubs or new Murmurs,  +ve B.Sounds, Abd Soft, No tenderness,   No Cyanosis, Clubbing or edema     RN pressure injury documentation: Pressure Injury 03/30/21 Ischial tuberosity Right;Left;Posterior Stage 1 -  Intact skin with non-blanchable redness of a localized area usually over a bony prominence. (Active)  03/30/21 1800  Location: Ischial tuberosity  Location Orientation: Right;Left;Posterior  Staging: Stage 1 -  Intact skin with non-blanchable redness of a localized area usually over a bony prominence.  Wound Description (Comments):   Present on Admission: Yes      Data Review:    Recent Labs  Lab 06/13/23 1714 06/13/23 1734 06/13/23 1740 06/14/23 0315 06/17/23 0424  WBC 12.3*  --   --  13.6* 9.7  HGB 10.9* 11.9* 12.2 11.0* 10.2*  HCT 36.3 35.0* 36.0 35.3* 32.1*  PLT 148*  --   --  139* 123*  MCV 101.7*  --   --  98.1 94.7  MCH 30.5  --   --  30.6 30.1  MCHC 30.0  --   --  31.2 31.8  RDW 14.7   --   --  14.7 14.6  LYMPHSABS 2.8  --   --   --   --   MONOABS 1.2*  --   --   --   --   EOSABS 0.1  --   --   --   --   BASOSABS 0.0  --   --   --   --     Recent Labs  Lab 06/13/23 1714 06/13/23 1734 06/13/23 1735 06/13/23 1740 06/13/23 1939 06/14/23 0023 06/14/23 0139 06/14/23 0315 06/14/23 0316 06/14/23 0456 06/17/23 0424  NA 139 139  --  138  --   --   --  136  --   --  136  K 3.6 3.5  --  3.8  --   --   --  3.5  --   --  2.8*  CL 100  --   --  100  --   --   --  102  --   --  103  CO2 26  --   --   --   --   --   --  26  --   --  25  ANIONGAP 13  --   --   --   --   --   --  8  --   --  8  GLUCOSE 118*  --   --  109*  --   --   --  163*  --   --  93  BUN 13  --   --  17  --   --   --  12  --   --  8  CREATININE 1.25*  --   --  1.30*  --   --   --  1.12*  --   --  0.63  AST 23  --   --   --   --   --   --   --   --   --   --   ALT 19  --   --   --   --   --   --   --   --   --   --   ALKPHOS 72  --   --   --   --   --   --   --   --   --   --   BILITOT 0.4  --   --   --   --   --   --   --   --   --   --   ALBUMIN 2.8*  --   --   --   --   --   --   --   --   --   --   CRP  --   --   --   --   --   --   --   --   --   --  10.2*  PROCALCITON  --   --   --   --   --  0.20  --   --   --   --  0.15  LATICACIDVEN  --   --  4.0*  --  2.1* 1.9 1.1  --   --  1.0  --   INR  --   --   --   --   --  1.1  --   --   --   --   --   TSH  --   --   --   --   --  0.550  --   --   --   --   --   HGBA1C  --   --   --   --   --   --   --   --  5.6  --   --   AMMONIA  --   --   --   --   --  12  --   --   --   --   --   BNP  --   --   --   --   --   --   --   --   --   --  66.3  MG  --   --   --   --   --   --   --  1.7  --   --  1.8  PHOS  --   --   --   --   --   --   --  3.2  --   --   --   CALCIUM 8.2*  --   --   --   --   --   --  8.0*  --   --  7.6*      Recent Labs  Lab 06/13/23 1714 06/13/23 1735 06/13/23 1939 06/14/23 0023 06/14/23 0139 06/14/23 0315 06/14/23 0316  06/14/23 0456 06/17/23 0424  CRP  --   --   --   --   --   --   --   --  10.2*  PROCALCITON  --   --   --  0.20  --   --   --   --  0.15  LATICACIDVEN  --  4.0* 2.1* 1.9 1.1  --   --  1.0  --   INR  --   --   --  1.1  --   --   --   --   --   TSH  --   --   --  0.550  --   --   --   --   --   HGBA1C  --   --   --   --   --   --  5.6  --   --   AMMONIA  --   --   --  12  --   --   --   --   --   BNP  --   --   --   --   --   --   --   --  66.3  MG  --   --   --   --   --  1.7  --   --  1.8  CALCIUM 8.2*  --   --   --   --  8.0*  --   --  7.6*    --------------------------------------------------------------------------------------------------------------- Lab Results  Component Value Date   CHOL 146 09/29/2022   HDL 42 09/29/2022   LDLCALC 76 09/29/2022   LDLDIRECT 59.0 09/26/2020   TRIG 162 (H) 09/29/2022   CHOLHDL 3.5 09/29/2022    Lab Results  Component Value Date   HGBA1C 5.6 06/14/2023    Radiology Reports DG Abd Portable 1V Result Date: 06/17/2023 CLINICAL DATA:  Shortness of breath and abdominal pain EXAM: PORTABLE ABDOMEN - 1 VIEW COMPARISON:  12/15/2022 FINDINGS: Diffuse gaseous distension of colon measuring at least 7.5 cm at the distal transverse segment, proximal colon possibly measuring up to 10 cm in diameter. No evidence of pneumatosis or small bowel obstruction. No concerning intra-abdominal mass effect or calcification. IMPRESSION: Gas distended colon, usually ileus. Electronically Signed   By: Tiburcio Pea M.D.   On: 06/17/2023 07:00   DG Chest Port 1 View Result Date: 06/17/2023 CLINICAL DATA:  Shortness of breath EXAM: PORTABLE CHEST 1 VIEW COMPARISON:  Four days ago FINDINGS: Indistinct density in the lower lungs which is stable. There is chronic lung disease by prior chest CT at the bases. Heart size is stable when allowing for significant distortion from rotation. Trace pleural fluid is possible on the right. No pneumothorax. IMPRESSION: Stable bilateral  pulmonary opacity at least partially related to chronic lung disease by prior chest CT, with possible superimposed pneumonia. Electronically Signed   By: Tiburcio Pea M.D.   On: 06/17/2023 06:58   ECHOCARDIOGRAM COMPLETE  Result Date: 06/15/2023    ECHOCARDIOGRAM REPORT   Patient Name:   CYDNIE DEASON Date of Exam: 06/15/2023 Medical Rec #:  [id number]         Height:       64.0 in Accession #:    [id number]        Weight:       234.6 lb Date of Birth:  [birth year]         BSA:          2.094 m Patient Age:    82 years          BP:           161/86 mmHg Patient Gender: F                 HR:           88 bpm. Exam Location:  Inpatient Procedure: 2D Echo, Cardiac Doppler and Color Doppler Indications:    Shock R57.9  History:        Patient has prior history of Echocardiogram examinations, most                 recent 11/06/2021. Risk Factors:Hypertension, Diabetes,                 Dyslipidemia and Non-Smoker.  Sonographer:    Dondra Prader RVT RCS Referring Phys: [id number] Patrici Ranks  Sonographer Comments: Image acquisition challenging due to uncooperative patient. Patient asked for exam to end. Patient unable to lie still. IMPRESSIONS  1. Left ventricular ejection fraction, by estimation, is 60 to 65%. The left ventricle has normal function. The left ventricle has no regional wall motion abnormalities. Left ventricular diastolic parameters are consistent with Grade I diastolic dysfunction (impaired relaxation).  2. Right ventricular systolic function is normal. The right ventricular size is normal. Tricuspid regurgitation signal is inadequate for assessing PA pressure.  3. The mitral valve is normal in structure. No evidence of mitral valve regurgitation. No evidence of mitral stenosis.  4. The aortic valve is tricuspid. There is mild calcification of the aortic valve. Aortic valve regurgitation is not visualized. No aortic stenosis is present.  5. IVC not visualized.  6. Exam truncated due to patient  noncompliance. FINDINGS  Left Ventricle: Left ventricular ejection fraction, by estimation, is 60 to 65%. The left ventricle has normal function. The left ventricle has no regional wall motion abnormalities. The left ventricular internal cavity size was normal in size. There is  no left ventricular hypertrophy. Left ventricular diastolic parameters are consistent with Grade I diastolic dysfunction (impaired relaxation). Right Ventricle: The right ventricular size is normal. No increase in right ventricular wall thickness. Right ventricular systolic function is normal. Tricuspid regurgitation signal is inadequate for assessing PA pressure. Left Atrium: Left atrial size was normal in size. Right Atrium: Right atrial size was normal in size. Pericardium: There is no evidence of pericardial effusion. Mitral Valve: The mitral valve is normal in structure. No evidence of mitral valve regurgitation. No evidence of mitral valve stenosis. Tricuspid Valve: The tricuspid valve is normal in structure. Tricuspid valve regurgitation is not demonstrated. Aortic Valve: The aortic valve is tricuspid. There is mild calcification of the aortic valve. Aortic valve regurgitation is not visualized. No aortic stenosis is present. Aortic valve mean gradient measures 5.0 mmHg. Aortic valve peak gradient measures 9.1 mmHg. Aortic valve area, by VTI measures 1.81 cm. Pulmonic Valve: The pulmonic valve was normal in structure. Pulmonic valve regurgitation is not visualized.  Aorta: The aortic root is normal in size and structure. Venous: The inferior vena cava was not well visualized. IAS/Shunts: No atrial level shunt detected by color flow Doppler.  LEFT VENTRICLE PLAX 2D LVIDd:         4.20 cm   Diastology LVIDs:         2.70 cm   LV e' medial:    5.51 cm/s LV PW:         1.20 cm   LV E/e' medial:  14.1 LV IVS:        0.90 cm   LV e' lateral:   6.57 cm/s LVOT diam:     1.70 cm   LV E/e' lateral: 11.9 LV SV:         47 LV SV Index:   23 LVOT  Area:     2.27 cm  RIGHT VENTRICLE RV S prime:     14.50 cm/s LEFT ATRIUM             Index        RIGHT ATRIUM          Index LA diam:        2.70 cm 1.29 cm/m   RA Area:     9.72 cm LA Vol (A2C):   40.9 ml 19.54 ml/m  RA Volume:   21.60 ml 10.32 ml/m LA Vol (A4C):   31.3 ml 14.93 ml/m LA Biplane Vol: 42.8 ml 20.44 ml/m  AORTIC VALVE AV Area (Vmax):    2.04 cm AV Area (Vmean):   1.87 cm AV Area (VTI):     1.81 cm AV Vmax:           151.00 cm/s AV Vmean:          102.000 cm/s AV VTI:            0.261 m AV Peak Grad:      9.1 mmHg AV Mean Grad:      5.0 mmHg LVOT Vmax:         136.00 cm/s LVOT Vmean:        83.900 cm/s LVOT VTI:          0.208 m LVOT/AV VTI ratio: 0.80  AORTA Ao Root diam: 2.90 cm Ao Asc diam:  3.40 cm MITRAL VALVE MV Area (PHT): 5.54 cm    SHUNTS MV Decel Time: 137 msec    Systemic VTI:  0.21 m MV E velocity: 77.90 cm/s  Systemic Diam: 1.70 cm MV A velocity: 81.80 cm/s MV E/A ratio:  0.95 Dalton McleanMD Electronically signed by Wilfred Lacy Signature Date/Time: 06/15/2023/10:38:49 AM    Final    DG Chest 1 View Result Date: 06/13/2023 CLINICAL DATA:  Dyspnea EXAM: CHEST  1 VIEW COMPARISON:  Chest x-ray 06/13/2023 FINDINGS: There are patchy airspace opacities in the right mid and upper lung as well as in the left lung base. There is no pleural effusion or pneumothorax. The cardiomediastinal silhouette is enlarged unchanged. Left-sided central venous catheter tip projects over the brachiocephalic SVC junction. No acute osseous abnormality. IMPRESSION: Patchy airspace opacities in the right mid and upper lung as well as in the left lung base, concerning for multifocal pneumonia. Electronically Signed   By: Darliss Cheney M.D.   On: 06/13/2023 23:00   CT Head Wo Contrast Result Date: 06/13/2023 CLINICAL DATA:  Mental status change, unknown cause. Altered mental status. Septic. EXAM: CT HEAD WITHOUT CONTRAST TECHNIQUE: Contiguous axial images were obtained from the base of the skull  through the vertex without intravenous contrast. RADIATION DOSE REDUCTION: This exam was performed according to the departmental dose-optimization program which includes automated exposure control, adjustment of the mA and/or kV according to patient size and/or use of iterative reconstruction technique. COMPARISON:  CT head without contrast 02/02/2022 FINDINGS: Brain: Mild atrophy and moderate white matter changes are stable. The ventricles are of normal size. Deep brain nuclei are within normal limits. No significant extraaxial fluid collection is present. The brainstem and cerebellum are within normal limits. Midline structures are within normal limits. Vascular: No hyperdense vessel or unexpected calcification. Skull: Calvarium is intact. No focal lytic or blastic lesions are present. No significant extracranial soft tissue lesion is present. Sinuses/Orbits: Extensive mucosal thickening and fluid is present in the right maxillary sinus. Mucosal thickening is present within right anterior ethmoid air cells. The paranasal sinuses and mastoid air cells are otherwise clear. Bilateral lens replacements are noted. Globes and orbits are otherwise unremarkable. A right lens replacement is present. Globes and orbits are otherwise within normal limits. IMPRESSION: 1. No acute intracranial abnormality or significant interval change. 2. Stable atrophy and moderate white matter disease. This likely reflects the sequela of chronic microvascular ischemia. 3. Right maxillary and ethmoid sinus disease. Electronically Signed   By: Marin Roberts M.D.   On: 06/13/2023 19:21   DG Chest Port 1 View Result Date: 06/13/2023 CLINICAL DATA:  Drug overdose and lethargy EXAM: PORTABLE CHEST 1 VIEW COMPARISON:  Chest radiograph dated 02/02/2022 FINDINGS: Patient is rotated to the right. Mildly low lung volumes. Diffuse patchy left lower lung opacity. Minimal right basilar linear opacity. No focal consolidations. No pleural effusion  or pneumothorax. Enlarged cardiomediastinal silhouette is likely projectional. No acute osseous abnormality. IMPRESSION: 1. Diffuse patchy left lower lung opacity, suspicious for aspiration. 2. Minimal right basilar linear opacity, likely atelectasis. Electronically Signed   By: Agustin Cree M.D.   On: 06/13/2023 18:55      Signature  -   Susa Raring M.D on 06/17/2023 at 9:51 AM   -  To page go to [email or web address]

## 2023-06-17 NOTE — Consult Note (Signed)
[facility] Liaison Consult Note    06/17/2023  [name] 1941/04/28 [id number]  Insurance: Medicare ACO REACH   Primary Care Provider: [name], MD with [facility], this provider is listed for the transition of care follow up appointments  and [facility] calls   [facility] rounding note, came to unit, patient is on Droplet Precautions with confusion per staff and notes, PPE preserved for unit staff. No family currently at bedside on rounds.   The patient was screened for high risk for readmission hospitalization with noted high risk score for unplanned readmission risk  2 hospital admissions in 6 months.  The patient was assessed for potential Community Care Coordination service needs for post hospital transition for care coordination. Review of patient's electronic medical record reveals patient is being recommended for a skilled nursing facility level of care for post hospital transition.   Plan: [facility] Liaison will continue to follow progress and disposition to asess for post hospital community care coordination/management needs.  Referral request for community care coordination: If patient goes to a [facility] affiliated facility then can have [facility] RN [facility] for follow up with facility for returning to community needs, if applicable.   [facility] Community Care, Population Health does not replace or interfere with any arrangements made by the Inpatient Transition of Care team.   For questions contact:   Charlesetta Shanks, RN, BSN, CCM [place]  [facility], [facility] Liaison Direct Dial: [phone] or secure chat Email: [email or web address]

## 2023-06-17 NOTE — Plan of Care (Signed)
Problem: Education: Goal: Knowledge of General Education information will improve Description: Including pain rating scale, medication(s)/side effects and non-pharmacologic comfort measures Outcome: Progressing   Problem: Clinical Measurements: Goal: Will remain free from infection Outcome: Progressing Goal: Diagnostic test results will improve Outcome: Progressing Goal: Respiratory complications will improve Outcome: Progressing Goal: Cardiovascular complication will be avoided Outcome: Progressing   Problem: Activity: Goal: Risk for activity intolerance will decrease Outcome: Progressing   Problem: Nutrition: Goal: Adequate nutrition will be maintained Outcome: Progressing   Problem: Coping: Goal: Level of anxiety will decrease Outcome: Progressing   Problem: Elimination: Goal: Will not experience complications related to bowel motility Outcome: Progressing Goal: Will not experience complications related to urinary retention Outcome: Progressing   Problem: Pain Managment: Goal: General experience of comfort will improve and/or be controlled Outcome: Progressing   Problem: Safety: Goal: Ability to remain free from injury will improve Outcome: Progressing   Problem: Skin Integrity: Goal: Risk for impaired skin integrity will decrease Outcome: Progressing

## 2023-06-17 NOTE — Progress Notes (Cosign Needed)
At 0900 pt was very lethargic and AxO x0. Pt has improved since then and is AxO to person and place. Pt is responding to commands clearly. Pt was able to tolerate medications given crushed. Pt Vte prophylaxis was administered  After gag reflex was checked to be present an attempt was made to swallow a small tablet with applesauce. The pt was unable to do so. Pt will continue to take po meds crushed with applesauce  Pt got a two full linen changes along with peri care and tolerated it well. Pt remains incontinent with a purewick in place. Pt did not get out of bed. Bed alarm remains on. Daughter stayed over night until 1300. Son in present at the bedside now.

## 2023-06-18 ENCOUNTER — Other Ambulatory Visit: Payer: Self-pay | Admitting: Family Medicine

## 2023-06-18 DIAGNOSIS — R6521 Severe sepsis with septic shock: Secondary | ICD-10-CM | POA: Diagnosis not present

## 2023-06-18 DIAGNOSIS — A419 Sepsis, unspecified organism: Secondary | ICD-10-CM | POA: Diagnosis not present

## 2023-06-18 DIAGNOSIS — K219 Gastro-esophageal reflux disease without esophagitis: Secondary | ICD-10-CM

## 2023-06-18 LAB — CBC WITH DIFFERENTIAL/PLATELET
Abs Immature Granulocytes: 0.07 10*3/uL (ref 0.00–0.07)
Basophils Absolute: 0 10*3/uL (ref 0.0–0.1)
Basophils Relative: 0 %
Eosinophils Absolute: 0.1 10*3/uL (ref 0.0–0.5)
Eosinophils Relative: 1 %
HCT: 32 % — ABNORMAL LOW (ref 36.0–46.0)
Hemoglobin: 10.2 g/dL — ABNORMAL LOW (ref 12.0–15.0)
Immature Granulocytes: 1 %
Lymphocytes Relative: 16 %
Lymphs Abs: 1.4 10*3/uL (ref 0.7–4.0)
MCH: 30.8 pg (ref 26.0–34.0)
MCHC: 31.9 g/dL (ref 30.0–36.0)
MCV: 96.7 fL (ref 80.0–100.0)
Monocytes Absolute: 0.9 10*3/uL (ref 0.1–1.0)
Monocytes Relative: 11 %
Neutro Abs: 5.9 10*3/uL (ref 1.7–7.7)
Neutrophils Relative %: 71 %
Platelets: 122 10*3/uL — ABNORMAL LOW (ref 150–400)
RBC: 3.31 MIL/uL — ABNORMAL LOW (ref 3.87–5.11)
RDW: 14.6 % (ref 11.5–15.5)
WBC: 8.3 10*3/uL (ref 4.0–10.5)
nRBC: 0 % (ref 0.0–0.2)

## 2023-06-18 LAB — COMPREHENSIVE METABOLIC PANEL
ALT: 23 U/L (ref 0–44)
AST: 26 U/L (ref 15–41)
Albumin: 2.2 g/dL — ABNORMAL LOW (ref 3.5–5.0)
Alkaline Phosphatase: 55 U/L (ref 38–126)
Anion gap: 7 (ref 5–15)
BUN: 8 mg/dL (ref 8–23)
CO2: 27 mmol/L (ref 22–32)
Calcium: 7.7 mg/dL — ABNORMAL LOW (ref 8.9–10.3)
Chloride: 103 mmol/L (ref 98–111)
Creatinine, Ser: 0.59 mg/dL (ref 0.44–1.00)
GFR, Estimated: >60 mL/min (ref >60)
Glucose, Bld: 87 mg/dL (ref 70–99)
Potassium: 3.4 mmol/L — ABNORMAL LOW (ref 3.5–5.1)
Sodium: 137 mmol/L (ref 135–145)
Total Bilirubin: 0.8 mg/dL (ref 0.0–1.2)
Total Protein: 4.6 g/dL — ABNORMAL LOW (ref 6.5–8.1)

## 2023-06-18 LAB — MAGNESIUM: Magnesium: 1.7 mg/dL (ref 1.7–2.4)

## 2023-06-18 LAB — GLUCOSE, CAPILLARY
Glucose-Capillary: 84 mg/dL (ref 70–99)
Glucose-Capillary: 83 mg/dL (ref 70–99)
Glucose-Capillary: 170 mg/dL — ABNORMAL HIGH (ref 70–99)

## 2023-06-18 LAB — PHOSPHORUS: Phosphorus: 1.6 mg/dL — ABNORMAL LOW (ref 2.5–4.6)

## 2023-06-18 LAB — BRAIN NATRIURETIC PEPTIDE: B Natriuretic Peptide: 52.7 pg/mL (ref 0.0–100.0)

## 2023-06-18 LAB — PROCALCITONIN: Procalcitonin: 0.13 ng/mL

## 2023-06-18 MED ORDER — POLYETHYLENE GLYCOL 3350 17 G PO PACK: 17.0000 g | PACK | Freq: Every day | ORAL | Status: DC | PRN

## 2023-06-18 MED ORDER — DEXTROSE 5 % IV SOLN: 30.0000 mmol | Freq: Once | INTRAVENOUS | Status: DC

## 2023-06-18 MED ORDER — FUROSEMIDE 40 MG PO TABS
60.0000 mg | ORAL_TABLET | Freq: Every day | ORAL | Status: DC
  Administered 2023-06-18 – 2023-06-20 (×3): 60 mg via ORAL
  Filled 2023-06-18 (×3): qty 1

## 2023-06-18 MED ORDER — POTASSIUM & SODIUM PHOSPHATES 280-160-250 MG PO PACK
2.0000 | PACK | Freq: Three times a day (TID) | ORAL | Status: AC
  Administered 2023-06-18 – 2023-06-19 (×5): 2 via ORAL
  Filled 2023-06-18 (×5): qty 2

## 2023-06-18 MED ORDER — POTASSIUM CHLORIDE CRYS ER 20 MEQ PO TBCR
20.0000 meq | EXTENDED_RELEASE_TABLET | Freq: Once | ORAL | Status: AC
  Administered 2023-06-18: 20 meq via ORAL
  Filled 2023-06-18: qty 1

## 2023-06-18 MED ORDER — SPIRONOLACTONE 25 MG PO TABS
50.0000 mg | ORAL_TABLET | Freq: Every day | ORAL | Status: DC
  Administered 2023-06-18 – 2023-06-20 (×3): 50 mg via ORAL
  Filled 2023-06-18 (×4): qty 2

## 2023-06-18 NOTE — Progress Notes (Signed)
Physical Therapy Treatment Patient Details Name: [name] MRN: [id number] DOB: 1940-08-23 Today's Date: 06/18/2023   History of Present Illness 83 year old female presents to [facility] ED on 1/25 with altered mental status. Acute encephalopathy due to severe sepsis, +RSV, + Flu. PMH significant for DMT2, HTN, chronic pain syndrome on morphine, RA on chronic steroids, ILD, bronchiectasis, chronic respiratory failure on O2, CKD 3b, chronic diastolic HF, PE/DVT on Eliquis, hypothyroidism.    PT Comments  Patient did not tolerate up at EOB due to c/o light headed/pre-syncopal, though BP was stable.  Attempted second time, though needing even more support for sitting EOB.  Patient assisted up to chair position and encouraged for improved upright tolerance to stay up.  Son present and supportive though giving lots of reasons she cannot participate.  PT will continue to follow.      If plan is discharge home, recommend the following: Two people to help with walking and/or transfers;A lot of help with bathing/dressing/bathroom;Assistance with cooking/housework;Direct supervision/assist for medications management;Direct supervision/assist for financial management;Assist for transportation;Help with stairs or ramp for entrance   Can travel by private vehicle        Equipment Recommendations  None recommended by PT    Recommendations for Other Services       Precautions / Restrictions Precautions Precautions: Fall     Mobility  Bed Mobility Overal bed mobility: Needs Assistance Bed Mobility: Rolling, Sidelying to Sit, Sit to Supine Rolling: Min assist, Used rails Sidelying to sit: Mod assist, HOB elevated, Used rails   Sit to supine: Mod assist, +2 for safety/equipment   General bed mobility comments: assist to use rail and flex opposite leg to roll and assist for leg off EOB and to lift trunk; to supine assist for legs and repositioning once supine; up to EOB second attempt to see if  tolerated better, though still requesting to lie down so up in chair position in bed    Transfers                   General transfer comment: NT due to pt symptomatic on EOB    Ambulation/Gait                   Stairs             Wheelchair Mobility     Tilt Bed    Modified Rankin (Stroke Patients Only)       Balance Overall balance assessment: Needs assistance   Sitting balance-Leahy Scale: Poor Sitting balance - Comments: leaning back on EOB mod to min A to sit                                    Cognition Arousal: Alert Behavior During Therapy: Anxious Overall Cognitive Status: Difficult to assess                                 General Comments: initially conversive and appropriate then with mobility quieter, moaning and limited communication/childlike        Exercises      General Comments General comments (skin integrity, edema, etc.): BP supine 112/67; sitting EOB 121/72 though not able to tolerate      Pertinent Vitals/Pain Pain Assessment Pain Assessment: Faces Faces Pain Scale: Hurts even more Pain Location: legs with movement Pain Descriptors / Indicators: Aching,  Discomfort, Grimacing, Moaning Pain Intervention(s): Repositioned, Monitored during session, Limited activity within patient's tolerance    Home Living                          Prior Function            PT Goals (current goals can now be found in the care plan section) Progress towards PT goals: Not progressing toward goals - comment    Frequency    Min 1X/week      PT Plan      Co-evaluation              AM-PAC PT "6 Clicks" Mobility   Outcome Measure  Help needed turning from your back to your side while in a flat bed without using bedrails?: A Little Help needed moving from lying on your back to sitting on the side of a flat bed without using bedrails?: A Lot Help needed moving to and from a bed  to a chair (including a wheelchair)?: Total Help needed standing up from a chair using your arms (e.g., wheelchair or bedside chair)?: Total Help needed to walk in hospital room?: Total Help needed climbing 3-5 steps with a railing? : Total 6 Click Score: 9    End of Session Equipment Utilized During Treatment: Gait belt;Oxygen Activity Tolerance: Other (comment) Patient left: in bed;with call bell/phone within reach;with family/visitor present (in chair position)   PT Visit Diagnosis: Difficulty in walking, not elsewhere classified (R26.2);Muscle weakness (generalized) (M62.81)     Time: 4098-1191 PT Time Calculation (min) (ACUTE ONLY): 21 min  Charges:    $Therapeutic Activity: 8-22 mins PT General Charges $$ ACUTE PT VISIT: 1 Visit                     Sheran Lawless, PT Acute Rehabilitation Services [phone] 06/18/2023    Elray Mcgregor 06/18/2023, 4:27 PM

## 2023-06-18 NOTE — TOC Progression Note (Addendum)
Transition of Care [facility]) - Progression Note    Patient Details  Name: [name] MRN: [id number] Date of Birth: 08-16-1940  [facility] CM/SW Contact  Camerin Ladouceur Reeves Forth, Student-Social Work Phone Number: 06/18/2023, 9:18 AM  Clinical Narrative:    MSW Intern spoke with pt daughter about SNF placement following pt discharge. Daughter stated that was still an option for them but the family had not discussed it further and would prefer home health over SNF if possible.   Expected Discharge Plan: Skilled Nursing Facility Barriers to Discharge: Continued Medical Work up  Expected Discharge Plan and Services In-house Referral: Clinical Social Work     Living arrangements for the past 2 months: Single Family Home                                       Social Determinants of Health (SDOH) Interventions SDOH Screenings   Food Insecurity: No Food Insecurity (06/16/2023)  Housing: Low Risk  (06/16/2023)  Transportation Needs: No Transportation Needs (06/16/2023)  Utilities: Not At Risk (06/16/2023)  Alcohol Screen: Low Risk  (10/27/2022)  Depression (PHQ2-9): High Risk (06/12/2023)  Financial Resource Strain: Low Risk  (06/11/2023)  Physical Activity: Inactive (06/11/2023)  Social Connections: Socially Integrated (06/16/2023)  Stress: No Stress Concern Present (10/27/2022)  Tobacco Use: Low Risk  (06/17/2023)    Readmission Risk Interventions    12/16/2022   11:09 AM  Readmission Risk Prevention Plan  Transportation Screening Complete  PCP or Specialist Appt within 5-7 Days Complete  Home Care Screening Complete  Medication Review (RN CM) Complete

## 2023-06-18 NOTE — Plan of Care (Signed)
  Problem: Education: Goal: Knowledge of General Education information will improve Description: Including pain rating scale, medication(s)/side effects and non-pharmacologic comfort measures Outcome: Progressing   Problem: Health Behavior/Discharge Planning: Goal: Ability to manage health-related needs will improve Outcome: Progressing   Problem: Clinical Measurements: Goal: Ability to maintain clinical measurements within normal limits will improve Outcome: Progressing Goal: Will remain free from infection Outcome: Progressing Goal: Diagnostic test results will improve Outcome: Progressing Goal: Respiratory complications will improve Outcome: Progressing Goal: Cardiovascular complication will be avoided Outcome: Progressing   Problem: Activity: Goal: Risk for activity intolerance will decrease Outcome: Progressing   Problem: Nutrition: Goal: Adequate nutrition will be maintained Outcome: Progressing   Problem: Coping: Goal: Level of anxiety will decrease Outcome: Progressing   Problem: Pain Managment: Goal: General experience of comfort will improve and/or be controlled Outcome: Progressing   Problem: Safety: Goal: Ability to remain free from injury will improve Outcome: Progressing

## 2023-06-18 NOTE — Progress Notes (Signed)
PROGRESS NOTE                                                                                                                                                                                                             Patient Demographics:    [name], is a 83 y.o. female, DOB - 1941/03/01, ZHY:[id number]  Outpatient Primary MD for the patient is [name], MD    LOS - 5  Admit date - 06/13/2023    Chief Complaint  Patient presents with   Drug Overdose       Brief Narrative (HPI from H&P)   83 year old female with PMH significant for DMT2, HTN, chronic pain syndrome on morphine, RA on chronic steroids, ILD 3 L nasal cannula oxygen chronically, bronchiectasis, chronic respiratory failure on O2, CKD 3b, chronic diastolic HF, PE/DVT on Eliquis, hypothyroidism presents to [facility] ED on 1/25 with altered mental status, for suggestive of sepsis due to RSV pneumonia she was admitted to ICU on pressors stabilized and transferred to my care on 06/17/2023.   Subjective:   Patient in bed, appears comfortable, denies any headache, no fever, no chest pain or pressure, no shortness of breath , no abdominal pain. No new focal weakness.   Assessment  & Plan :    Septic shock secondary to flu A / RSV pneumonia: Possible concomitant bacterial infection but seems less likely.  [name] admitted to ICU required Levophed, now stabilized sepsis pathophysiology has resolved, finishing her Tamiflu course, also finishing 5 days of Rocephin, chronically on azithromycin at baseline which will be continued.  Blood cultures with 2 different staph species in 2 different sets likely contamination.  Urine culture noted sensitive to Rocephin which should suffice.  Overall much better, advance activity, PT OT, titrate down oxygen.  Encouraged to use I-S and flutter valve for pulmonary toiletry.  Clinically improving.   Chronic hypoxic respiratory  failure: ILD.  History of rheumatoid arthritis.  Chronically on 3 L nasal cannula oxygen. History of rheumatoid arthritis / Interstitial lung disease: She follows with Dr. Marchelle Gearing. Hold etanercept, plaquenil, leflunomide in setting of viral infection.  New home oral steroid at slightly higher dose due to acute stress, supportive care and monitor.   Acute encephalopathy due to severe sepsis:  CT head nonacute, no headache or focal deficits.  With supportive care mentation has  improved.  Continue to hold Flexeril, Cymbalta and gabapentin.  Continue oxycodone and chronic morphine for now with caution.  Narcan on board.   Chronic diastolic CHF/ HTN: Last 2D echo in 2023 showed LVEF of 55% with grade 1 diastolic dysfunction Continue hydralazine 25 mg 3 times daily, continue beta-blocker, as needed hydralazine IV for blood pressure control.  See below.    AKI on CKD stage IIIa: Serum creatinine improved with IV fluids. Shock Resolved.  Encourage oral intake, resuming diuretics on 06/18/2023, ARB on 06/19/2023.   History of DVT/PE: Currently not on AC. Telemetry monitoring   Chronic anemia: H&H remains stable.  Monitor H&H.  No signs of obvious bleeding.   Hypothyroidism: Continue levothyroxine.   Hypertension.  Currently on combination of beta-blocker, hydralazine.  Will add low-dose Norvasc and monitor.  As needed hydralazine IV also added.    Hx of dementia: Aricept resumed.   Hypokalemia and hypophosphatemia.  Replaced.    Diabetes mellitus type II SSI and CBG monitoring Hb A1c 5.6.  Well-controlled.  Lab Results  Component Value Date   HGBA1C 5.6 06/14/2023   CBG (last 3)  Recent Labs    06/17/23 1147 06/17/23 1734 06/18/23 0746  GLUCAP 146* 124* 84        Condition - Extremely Guarded  Family Communication  :  daughter bedside 06/17/23, 06/18/2023  Code Status :  Full  Consults  :  PCCM  PUD Prophylaxis : PPI   Procedures  :     TTE -  1. Left ventricular  ejection fraction, by estimation, is 60 to 65%. The left ventricle has normal function. The left ventricle has no regional wall motion abnormalities. Left ventricular diastolic parameters are consistent with Grade I diastolic dysfunction (impaired relaxation).  2. Right ventricular systolic function is normal. The right ventricular size is normal. Tricuspid regurgitation signal is inadequate for assessing PA pressure.  3. The mitral valve is normal in structure. No evidence of mitral valve regurgitation. No evidence of mitral stenosis.  4. The aortic valve is tricuspid. There is mild calcification of the aortic valve. Aortic valve regurgitation is not visualized. No aortic stenosis is present.  5. IVC not visualized.  6. Exam truncated due to patient noncompliance  CT Head - Non acute      Disposition Plan  :    Status is: Inpatient  DVT Prophylaxis  :    heparin injection 5,000 Units Start: 06/13/23 2200    Lab Results  Component Value Date   PLT 122 (L) 06/18/2023    Diet :  Diet Order             DIET SOFT Room service appropriate? Yes; Fluid consistency: Thin  Diet effective now                    Inpatient Medications  Scheduled Meds:  allopurinol  300 mg Oral Daily   amLODipine  10 mg Oral Daily   atorvastatin  10 mg Oral QHS   azithromycin  250 mg Oral Q M,W,F   Chlorhexidine Gluconate Cloth  6 each Topical Daily   docusate sodium  200 mg Oral BID   donepezil  10 mg Oral QHS   feeding supplement  237 mL Oral BID BM   furosemide  60 mg Oral Daily   guaiFENesin-dextromethorphan  15 mL Oral QID   heparin  5,000 Units Subcutaneous Q8H   hydrALAZINE  50 mg Oral Q8H   insulin aspart  0-6 Units Subcutaneous  TID WC   ipratropium-albuterol  3 mL Nebulization Q6H   levothyroxine  50 mcg Oral Daily   metoprolol tartrate  50 mg Oral BID   morphine  15 mg Oral Q12H   oseltamivir  75 mg Oral Once   Followed by   oseltamivir  75 mg Oral BID   pantoprazole  40 mg Oral  Daily   polyethylene glycol  17 g Oral BID   potassium chloride  40 mEq Oral BID   potassium chloride  40 mEq Oral Once   predniSONE  20 mg Oral Q breakfast   senna-docusate  2 tablet Oral BID   spironolactone  50 mg Oral Daily   Continuous Infusions:  cefTRIAXone (ROCEPHIN)  IV Stopped (06/17/23 1836)   lactated ringers     potassium PHOSPHATE IVPB (in mmol)     PRN Meds:.acetaminophen **OR** acetaminophen, fluticasone, hydrALAZINE, ipratropium-albuterol, morphine injection, naLOXone (NARCAN)  injection, ondansetron (ZOFRAN) IV, mouth rinse, oxyCODONE     Objective:   Vitals:   06/18/23 0500 06/18/23 0638 06/18/23 0745 06/18/23 0835  BP:  122/70 131/82   Pulse:  97 99   Resp:  16 18   Temp:  98.6 F (37 C) 98.2 F (36.8 C)   TempSrc:  Oral Oral   SpO2:  94% 94% 96%  Weight: 105.2 kg     Height:        Wt Readings from Last 3 Encounters:  06/18/23 105.2 kg  06/12/23 103.1 kg  06/02/23 105.9 kg     Intake/Output Summary (Last 24 hours) at 06/18/2023 0936 Last data filed at 06/17/2023 2357 Gross per 24 hour  Intake 420 ml  Output 200 ml  Net 220 ml     Physical Exam  Awake Alert, No new F.N deficits, Normal affect [place].AT,PERRAL Supple Neck, No JVD,   Symmetrical Chest wall movement, Good air movement bilaterally, CTAB RRR,No Gallops,Rubs or new Murmurs,  +ve B.Sounds, Abd Soft, No tenderness,   No Cyanosis, Clubbing or edema     RN pressure injury documentation: Pressure Injury 03/30/21 Ischial tuberosity Right;Left;Posterior Stage 1 -  Intact skin with non-blanchable redness of a localized area usually over a bony prominence. (Active)  03/30/21 1800  Location: Ischial tuberosity  Location Orientation: Right;Left;Posterior  Staging: Stage 1 -  Intact skin with non-blanchable redness of a localized area usually over a bony prominence.  Wound Description (Comments):   Present on Admission: Yes      Data Review:    Recent Labs  Lab 06/13/23 1714  06/13/23 1734 06/13/23 1740 06/14/23 0315 06/17/23 0424 06/18/23 0450  WBC 12.3*  --   --  13.6* 9.7 8.3  HGB 10.9* 11.9* 12.2 11.0* 10.2* 10.2*  HCT 36.3 35.0* 36.0 35.3* 32.1* 32.0*  PLT 148*  --   --  139* 123* 122*  MCV 101.7*  --   --  98.1 94.7 96.7  MCH 30.5  --   --  30.6 30.1 30.8  MCHC 30.0  --   --  31.2 31.8 31.9  RDW 14.7  --   --  14.7 14.6 14.6  LYMPHSABS 2.8  --   --   --   --  1.4  MONOABS 1.2*  --   --   --   --  0.9  EOSABS 0.1  --   --   --   --  0.1  BASOSABS 0.0  --   --   --   --  0.0    Recent Labs  Lab 06/13/23 1714 06/13/23 1734 06/13/23 1735 06/13/23 1740 06/13/23 1939 06/14/23 0023 06/14/23 0139 06/14/23 0315 06/14/23 0316 06/14/23 0456 06/17/23 0424 06/18/23 0450  NA 139 139  --  138  --   --   --  136  --   --  136 137  K 3.6 3.5  --  3.8  --   --   --  3.5  --   --  2.8* 3.4*  CL 100  --   --  100  --   --   --  102  --   --  103 103  CO2 26  --   --   --   --   --   --  26  --   --  25 27  ANIONGAP 13  --   --   --   --   --   --  8  --   --  8 7  GLUCOSE 118*  --   --  109*  --   --   --  163*  --   --  93 87  BUN 13  --   --  17  --   --   --  12  --   --  8 8  CREATININE 1.25*  --   --  1.30*  --   --   --  1.12*  --   --  0.63 0.59  AST 23  --   --   --   --   --   --   --   --   --   --  26  ALT 19  --   --   --   --   --   --   --   --   --   --  23  ALKPHOS 72  --   --   --   --   --   --   --   --   --   --  55  BILITOT 0.4  --   --   --   --   --   --   --   --   --   --  0.8  ALBUMIN 2.8*  --   --   --   --   --   --   --   --   --   --  2.2*  CRP  --   --   --   --   --   --   --   --   --   --  10.2*  --   PROCALCITON  --   --   --   --   --  0.20  --   --   --   --  0.15 0.13  LATICACIDVEN  --   --  4.0*  --  2.1* 1.9 1.1  --   --  1.0  --   --   INR  --   --   --   --   --  1.1  --   --   --   --   --   --   TSH  --   --   --   --   --  0.550  --   --   --   --   --   --   HGBA1C  --   --   --   --   --   --   --   --  5.6  --   --   --   AMMONIA  --   --   --   --   --  12  --   --   --   --   --   --   BNP  --   --   --   --   --   --   --   --   --   --  66.3 52.7  MG  --   --   --   --   --   --   --  1.7  --   --  1.8 1.7  PHOS  --   --   --   --   --   --   --  3.2  --   --   --  1.6*  CALCIUM 8.2*  --   --   --   --   --   --  8.0*  --   --  7.6* 7.7*      Recent Labs  Lab 06/13/23 1714 06/13/23 1735 06/13/23 1939 06/14/23 0023 06/14/23 0139 06/14/23 0315 06/14/23 0316 06/14/23 0456 06/17/23 0424 06/18/23 0450  CRP  --   --   --   --   --   --   --   --  10.2*  --   PROCALCITON  --   --   --  0.20  --   --   --   --  0.15 0.13  LATICACIDVEN  --  4.0* 2.1* 1.9 1.1  --   --  1.0  --   --   INR  --   --   --  1.1  --   --   --   --   --   --   TSH  --   --   --  0.550  --   --   --   --   --   --   HGBA1C  --   --   --   --   --   --  5.6  --   --   --   AMMONIA  --   --   --  12  --   --   --   --   --   --   BNP  --   --   --   --   --   --   --   --  66.3 52.7  MG  --   --   --   --   --  1.7  --   --  1.8 1.7  CALCIUM 8.2*  --   --   --   --  8.0*  --   --  7.6* 7.7*    --------------------------------------------------------------------------------------------------------------- Lab Results  Component Value Date   CHOL 146 09/29/2022   HDL 42 09/29/2022   LDLCALC 76 09/29/2022   LDLDIRECT 59.0 09/26/2020   TRIG 162 (H) 09/29/2022   CHOLHDL 3.5 09/29/2022    Lab Results  Component Value Date   HGBA1C 5.6 06/14/2023    Radiology Reports DG Abd Portable 1V Result Date: 06/17/2023 CLINICAL DATA:  Shortness of breath and abdominal pain EXAM: PORTABLE ABDOMEN - 1 VIEW COMPARISON:  12/15/2022 FINDINGS: Diffuse gaseous distension of colon measuring at least 7.5 cm at the distal transverse segment, proximal colon possibly measuring up to 10 cm in diameter. No evidence of pneumatosis or small bowel obstruction.  No concerning intra-abdominal mass effect or calcification. IMPRESSION:  Gas distended colon, usually ileus. Electronically Signed   By: Tiburcio Pea M.D.   On: 06/17/2023 07:00   DG Chest Port 1 View Result Date: 06/17/2023 CLINICAL DATA:  Shortness of breath EXAM: PORTABLE CHEST 1 VIEW COMPARISON:  Four days ago FINDINGS: Indistinct density in the lower lungs which is stable. There is chronic lung disease by prior chest CT at the bases. Heart size is stable when allowing for significant distortion from rotation. Trace pleural fluid is possible on the right. No pneumothorax. IMPRESSION: Stable bilateral pulmonary opacity at least partially related to chronic lung disease by prior chest CT, with possible superimposed pneumonia. Electronically Signed   By: Tiburcio Pea M.D.   On: 06/17/2023 06:58   ECHOCARDIOGRAM COMPLETE Result Date: 06/15/2023    ECHOCARDIOGRAM REPORT   Patient Name:   RENI HAUSNER Date of Exam: 06/15/2023 Medical Rec #:  [id number]         Height:       64.0 in Accession #:    [id number]        Weight:       234.6 lb Date of Birth:  February 06, 1941         BSA:          2.094 m Patient Age:    82 years          BP:           161/86 mmHg Patient Gender: F                 HR:           88 bpm. Exam Location:  Inpatient Procedure: 2D Echo, Cardiac Doppler and Color Doppler Indications:    Shock R57.9  History:        Patient has prior history of Echocardiogram examinations, most                 recent 11/06/2021. Risk Factors:Hypertension, Diabetes,                 Dyslipidemia and Non-Smoker.  Sonographer:    Dondra Prader RVT RCS Referring Phys: [id number] Patrici Ranks  Sonographer Comments: Image acquisition challenging due to uncooperative patient. Patient asked for exam to end. Patient unable to lie still. IMPRESSIONS  1. Left ventricular ejection fraction, by estimation, is 60 to 65%. The left ventricle has normal function. The left ventricle has no regional wall motion abnormalities. Left ventricular diastolic parameters are consistent with Grade I  diastolic dysfunction (impaired relaxation).  2. Right ventricular systolic function is normal. The right ventricular size is normal. Tricuspid regurgitation signal is inadequate for assessing PA pressure.  3. The mitral valve is normal in structure. No evidence of mitral valve regurgitation. No evidence of mitral stenosis.  4. The aortic valve is tricuspid. There is mild calcification of the aortic valve. Aortic valve regurgitation is not visualized. No aortic stenosis is present.  5. IVC not visualized.  6. Exam truncated due to patient noncompliance. FINDINGS  Left Ventricle: Left ventricular ejection fraction, by estimation, is 60 to 65%. The left ventricle has normal function. The left ventricle has no regional wall motion abnormalities. The left ventricular internal cavity size was normal in size. There is  no left ventricular hypertrophy. Left ventricular diastolic parameters are consistent with Grade I diastolic dysfunction (impaired relaxation). Right Ventricle: The right ventricular size is normal. No increase in right ventricular wall thickness. Right ventricular systolic function  is normal. Tricuspid regurgitation signal is inadequate for assessing PA pressure. Left Atrium: Left atrial size was normal in size. Right Atrium: Right atrial size was normal in size. Pericardium: There is no evidence of pericardial effusion. Mitral Valve: The mitral valve is normal in structure. No evidence of mitral valve regurgitation. No evidence of mitral valve stenosis. Tricuspid Valve: The tricuspid valve is normal in structure. Tricuspid valve regurgitation is not demonstrated. Aortic Valve: The aortic valve is tricuspid. There is mild calcification of the aortic valve. Aortic valve regurgitation is not visualized. No aortic stenosis is present. Aortic valve mean gradient measures 5.0 mmHg. Aortic valve peak gradient measures 9.1 mmHg. Aortic valve area, by VTI measures 1.81 cm. Pulmonic Valve: The pulmonic valve was  normal in structure. Pulmonic valve regurgitation is not visualized. Aorta: The aortic root is normal in size and structure. Venous: The inferior vena cava was not well visualized. IAS/Shunts: No atrial level shunt detected by color flow Doppler.  LEFT VENTRICLE PLAX 2D LVIDd:         4.20 cm   Diastology LVIDs:         2.70 cm   LV e' medial:    5.51 cm/s LV PW:         1.20 cm   LV E/e' medial:  14.1 LV IVS:        0.90 cm   LV e' lateral:   6.57 cm/s LVOT diam:     1.70 cm   LV E/e' lateral: 11.9 LV SV:         47 LV SV Index:   23 LVOT Area:     2.27 cm  RIGHT VENTRICLE RV S prime:     14.50 cm/s LEFT ATRIUM             Index        RIGHT ATRIUM          Index LA diam:        2.70 cm 1.29 cm/m   RA Area:     9.72 cm LA Vol (A2C):   40.9 ml 19.54 ml/m  RA Volume:   21.60 ml 10.32 ml/m LA Vol (A4C):   31.3 ml 14.93 ml/m LA Biplane Vol: 42.8 ml 20.44 ml/m  AORTIC VALVE AV Area (Vmax):    2.04 cm AV Area (Vmean):   1.87 cm AV Area (VTI):     1.81 cm AV Vmax:           151.00 cm/s AV Vmean:          102.000 cm/s AV VTI:            0.261 m AV Peak Grad:      9.1 mmHg AV Mean Grad:      5.0 mmHg LVOT Vmax:         136.00 cm/s LVOT Vmean:        83.900 cm/s LVOT VTI:          0.208 m LVOT/AV VTI ratio: 0.80  AORTA Ao Root diam: 2.90 cm Ao Asc diam:  3.40 cm MITRAL VALVE MV Area (PHT): 5.54 cm    SHUNTS MV Decel Time: 137 msec    Systemic VTI:  0.21 m MV E velocity: 77.90 cm/s  Systemic Diam: 1.70 cm MV A velocity: 81.80 cm/s MV E/A ratio:  0.95 Dalton McleanMD Electronically signed by Wilfred Lacy Signature Date/Time: 06/15/2023/10:38:49 AM    Final       Signature  -   Bess Harvest  Thedore Mins M.D on 06/18/2023 at 9:36 AM   -  To page go to [email or web address]

## 2023-06-18 NOTE — TOC Progression Note (Signed)
Transition of Care [facility]) - Progression Note    Patient Details  Name: [name] MRN: [id number] Date of Birth: [birth year]  [facility] CM/SW Contact  Zoey Gilkeson Reeves Forth, Student-Social Work Phone Number: 06/18/2023, 2:57 PM  Clinical Narrative:    MSW Intern spoke with pt daughter, Azucena Kuba over the phone about SNF placement following pt discharge. Azucena Kuba stated she is still waiting to hear back from one of her siblings before they make their final decisions. MSW Intern provided Azucena Kuba with a call back number and informed her a different social worker would be covering the unit 1/31. Azucena Kuba said she would call back as soon as she heard from her sibling.    Expected Discharge Plan: Skilled Nursing Facility Barriers to Discharge: Continued Medical Work up  Expected Discharge Plan and Services In-house Referral: Clinical Social Work     Living arrangements for the past 2 months: Single Family Home                                       Social Determinants of Health (SDOH) Interventions SDOH Screenings   Food Insecurity: No Food Insecurity (06/16/2023)  Housing: Low Risk  (06/16/2023)  Transportation Needs: No Transportation Needs (06/16/2023)  Utilities: Not At Risk (06/16/2023)  Alcohol Screen: Low Risk  (10/27/2022)  Depression (PHQ2-9): High Risk (06/12/2023)  Financial Resource Strain: Low Risk  (06/11/2023)  Physical Activity: Inactive (06/11/2023)  Social Connections: Socially Integrated (06/16/2023)  Stress: No Stress Concern Present (10/27/2022)  Tobacco Use: Low Risk  (06/17/2023)    Readmission Risk Interventions    12/16/2022   11:09 AM  Readmission Risk Prevention Plan  Transportation Screening Complete  PCP or Specialist Appt within 5-7 Days Complete  Home Care Screening Complete  Medication Review (RN CM) Complete

## 2023-06-19 ENCOUNTER — Telehealth: Payer: Self-pay

## 2023-06-19 ENCOUNTER — Telehealth: Payer: Self-pay | Admitting: Internal Medicine

## 2023-06-19 DIAGNOSIS — A419 Sepsis, unspecified organism: Secondary | ICD-10-CM | POA: Diagnosis not present

## 2023-06-19 DIAGNOSIS — R6521 Severe sepsis with septic shock: Secondary | ICD-10-CM | POA: Diagnosis not present

## 2023-06-19 LAB — BRAIN NATRIURETIC PEPTIDE: B Natriuretic Peptide: 33.1 pg/mL (ref 0.0–100.0)

## 2023-06-19 LAB — COMPREHENSIVE METABOLIC PANEL
ALT: 25 U/L (ref 0–44)
AST: 25 U/L (ref 15–41)
Albumin: 2.2 g/dL — ABNORMAL LOW (ref 3.5–5.0)
Alkaline Phosphatase: 48 U/L (ref 38–126)
Anion gap: 10 (ref 5–15)
BUN: 8 mg/dL (ref 8–23)
CO2: 27 mmol/L (ref 22–32)
Calcium: 7.5 mg/dL — ABNORMAL LOW (ref 8.9–10.3)
Chloride: 101 mmol/L (ref 98–111)
Creatinine, Ser: 0.65 mg/dL (ref 0.44–1.00)
GFR, Estimated: >60 mL/min (ref >60)
Glucose, Bld: 89 mg/dL (ref 70–99)
Potassium: 3.4 mmol/L — ABNORMAL LOW (ref 3.5–5.1)
Sodium: 138 mmol/L (ref 135–145)
Total Bilirubin: 0.5 mg/dL (ref 0.0–1.2)
Total Protein: 4.7 g/dL — ABNORMAL LOW (ref 6.5–8.1)

## 2023-06-19 LAB — CBC WITH DIFFERENTIAL/PLATELET
Abs Immature Granulocytes: 0.18 10*3/uL — ABNORMAL HIGH (ref 0.00–0.07)
Basophils Absolute: 0 10*3/uL (ref 0.0–0.1)
Basophils Relative: 0 %
Eosinophils Absolute: 0.1 10*3/uL (ref 0.0–0.5)
Eosinophils Relative: 1 %
HCT: 31.2 % — ABNORMAL LOW (ref 36.0–46.0)
Hemoglobin: 9.9 g/dL — ABNORMAL LOW (ref 12.0–15.0)
Immature Granulocytes: 3 %
Lymphocytes Relative: 24 %
Lymphs Abs: 1.6 10*3/uL (ref 0.7–4.0)
MCH: 30.4 pg (ref 26.0–34.0)
MCHC: 31.7 g/dL (ref 30.0–36.0)
MCV: 95.7 fL (ref 80.0–100.0)
Monocytes Absolute: 0.9 10*3/uL (ref 0.1–1.0)
Monocytes Relative: 12 %
Neutro Abs: 4.2 10*3/uL (ref 1.7–7.7)
Neutrophils Relative %: 60 %
Platelets: 156 10*3/uL (ref 150–400)
RBC: 3.26 MIL/uL — ABNORMAL LOW (ref 3.87–5.11)
RDW: 14.7 % (ref 11.5–15.5)
WBC: 7 10*3/uL (ref 4.0–10.5)
nRBC: 0 % (ref 0.0–0.2)

## 2023-06-19 LAB — GLUCOSE, CAPILLARY
Glucose-Capillary: 102 mg/dL — ABNORMAL HIGH (ref 70–99)
Glucose-Capillary: 126 mg/dL — ABNORMAL HIGH (ref 70–99)
Glucose-Capillary: 188 mg/dL — ABNORMAL HIGH (ref 70–99)
Glucose-Capillary: 128 mg/dL — ABNORMAL HIGH (ref 70–99)

## 2023-06-19 LAB — PROCALCITONIN: Procalcitonin: 0.11 ng/mL

## 2023-06-19 LAB — PHOSPHORUS: Phosphorus: 2 mg/dL — ABNORMAL LOW (ref 2.5–4.6)

## 2023-06-19 LAB — MAGNESIUM: Magnesium: 1.7 mg/dL (ref 1.7–2.4)

## 2023-06-19 MED ORDER — POTASSIUM CHLORIDE CRYS ER 20 MEQ PO TBCR
20.0000 meq | EXTENDED_RELEASE_TABLET | Freq: Once | ORAL | Status: AC
  Administered 2023-06-19: 20 meq via ORAL
  Filled 2023-06-19: qty 1

## 2023-06-19 MED ORDER — OXYCODONE HCL 5 MG PO TABS
5.0000 mg | ORAL_TABLET | ORAL | Status: DC | PRN
  Administered 2023-06-20 – 2023-06-26 (×5): 5 mg via ORAL
  Filled 2023-06-19 (×5): qty 1

## 2023-06-19 MED ORDER — DOCUSATE SODIUM 100 MG PO CAPS
200.0000 mg | ORAL_CAPSULE | Freq: Two times a day (BID) | ORAL | Status: DC
  Administered 2023-06-22 – 2023-06-25 (×3): 200 mg via ORAL
  Filled 2023-06-19 (×10): qty 2

## 2023-06-19 MED ORDER — POTASSIUM PHOSPHATES 15 MMOLE/5ML IV SOLN
30.0000 mmol | Freq: Once | INTRAVENOUS | Status: AC
  Administered 2023-06-19: 30 mmol via INTRAVENOUS
  Filled 2023-06-19: qty 10

## 2023-06-19 MED ORDER — LOPERAMIDE HCL 2 MG PO CAPS
2.0000 mg | ORAL_CAPSULE | Freq: Once | ORAL | Status: AC
  Administered 2023-06-19: 2 mg via ORAL
  Filled 2023-06-19: qty 1

## 2023-06-19 MED ORDER — LOSARTAN POTASSIUM 50 MG PO TABS
25.0000 mg | ORAL_TABLET | Freq: Every day | ORAL | Status: DC
  Administered 2023-06-19 – 2023-06-20 (×2): 25 mg via ORAL
  Filled 2023-06-19 (×2): qty 1

## 2023-06-19 NOTE — Telephone Encounter (Signed)
Daughter [name] called to clarify medications for patient. I called daughter back and expressed her concerns about how the patient medications was being administered while in the hospital. She states patient has been in the hospital since 06/13/23 and they are not giving her the Morphine and Oxycodone/APAP on her normal schedule and not giving her Gabapentin at all. Daughter also states that she has RSV and flu and they have been giving her Robitussin instead of Mucinex. Daughter feels she needs Mucinex as she has a wet not dry cough.  Daughter presides to tell me that paramedics gave patient narcan when they arrived at the patient home because she was not responding to them. I told daughter that is probably why they not giving her pain medication like she normally takes it. I advised her to call the charge nurse to discuss her concerns.

## 2023-06-19 NOTE — Progress Notes (Signed)
PROGRESS NOTE                                                                                                                                                                                                             Patient Demographics:    [name], is a 83 y.o. female, DOB - Apr 02, 1941, WUJ:[id number]  Outpatient Primary MD for the patient is [name], MD    LOS - 6  Admit date - 06/13/2023    Chief Complaint  Patient presents with   Drug Overdose       Brief Narrative (HPI from H&P)   83 year old female with PMH significant for DMT2, HTN, chronic pain syndrome on morphine, RA on chronic steroids, ILD 3 L nasal cannula oxygen chronically, bronchiectasis, chronic respiratory failure on O2, CKD 3b, chronic diastolic HF, PE/DVT on Eliquis, hypothyroidism presents to [facility] ED on 1/25 with altered mental status, for suggestive of sepsis due to RSV pneumonia she was admitted to ICU on pressors stabilized and transferred to my care on 06/17/2023.   Subjective:   Patient in bed, appears comfortable, denies any headache, no fever, no chest pain or pressure, no shortness of breath , no abdominal pain. No new focal weakness.   Assessment  & Plan :   Septic shock secondary to flu A / RSV pneumonia:  Possible concomitant bacterial infection but seems less likely.  [name] admitted to ICU required Levophed, now stabilized sepsis pathophysiology has resolved, finishing her Tamiflu course, also finishing 5 days of Rocephin, chronically on azithromycin at baseline which will be continued.  Blood cultures with 2 different staph species in 2 different sets likely contamination.  Urine culture noted sensitive to Rocephin which should suffice.  Overall much better, advance activity, PT OT, titrate down oxygen.  Encouraged to use I-S and flutter valve for pulmonary toiletry.  Clinically improving.   Chronic hypoxic respiratory failure:  ILD.  History of rheumatoid arthritis.  Chronically on 3 L nasal cannula oxygen. History of rheumatoid arthritis / Interstitial lung disease:  She follows with Dr. Marchelle Gearing. Hold etanercept, plaquenil, leflunomide in setting of viral infection.  New home oral steroid at slightly higher dose due to acute stress, supportive care and monitor.   Acute encephalopathy due to severe sepsis: CT head nonacute, no headache or focal deficits.  With supportive care mentation has  improved.  Continue to hold Flexeril, Cymbalta and gabapentin.  Continue oxycodone and chronic morphine for now with caution.  Narcan on board.   Chronic diastolic CHF/ HTN: Last 2D echo in 2023 showed LVEF of 55% with grade 1 diastolic dysfunction, Continue hydralazine 25 mg 3 times daily, continue beta-blocker, as needed hydralazine IV for blood pressure control.  See below.    AKI on CKD stage IIIa: Serum creatinine improved with IV fluids. Shock Resolved.  Encourage oral intake, resuming diuretics on 06/18/2023, ARB on 06/19/2023.   History of DVT/PE: Currently not on AC. Telemetry monitoring   Chronic anemia: H&H remains stable.  Monitor H&H.  No signs of obvious bleeding.   Hypothyroidism: Continue levothyroxine.   Hypertension.  Currently on combination of beta-blocker, hydralazine.  Will add low-dose Norvasc and monitor.  As needed hydralazine IV also added.    Hx of dementia: Aricept resumed.   Hypokalemia and hypophosphatemia.  Replaced.    Diabetes mellitus type II - SSI and CBG monitoring, Hb A1c 5.6.  Well-controlled.  Lab Results  Component Value Date   HGBA1C 5.6 06/14/2023   CBG (last 3)  Recent Labs    06/18/23 1217 06/18/23 1720 06/19/23 0752  GLUCAP 83 170* 102*        Condition - Extremely Guarded  Family Communication  :  daughter bedside 06/17/23, 06/18/2023, family member updated at bedside on 06/19/2023.  Son Lyda Jester at [phone]  on 06/19/2023  Code Status :  Full  Consults  :  PCCM  PUD  Prophylaxis : PPI   Procedures  :     TTE -  1. Left ventricular ejection fraction, by estimation, is 60 to 65%. The left ventricle has normal function. The left ventricle has no regional wall motion abnormalities. Left ventricular diastolic parameters are consistent with Grade I diastolic dysfunction (impaired relaxation).  2. Right ventricular systolic function is normal. The right ventricular size is normal. Tricuspid regurgitation signal is inadequate for assessing PA pressure.  3. The mitral valve is normal in structure. No evidence of mitral valve regurgitation. No evidence of mitral stenosis.  4. The aortic valve is tricuspid. There is mild calcification of the aortic valve. Aortic valve regurgitation is not visualized. No aortic stenosis is present.  5. IVC not visualized.  6. Exam truncated due to patient noncompliance  CT Head - Non acute      Disposition Plan  :    Status is: Inpatient  DVT Prophylaxis  :    heparin injection 5,000 Units Start: 06/13/23 2200    Lab Results  Component Value Date   PLT 156 06/19/2023    Diet :  Diet Order             DIET SOFT Room service appropriate? Yes; Fluid consistency: Thin  Diet effective now                    Inpatient Medications  Scheduled Meds:  allopurinol  300 mg Oral Daily   amLODipine  10 mg Oral Daily   atorvastatin  10 mg Oral QHS   Chlorhexidine Gluconate Cloth  6 each Topical Daily   [START ON 06/20/2023] docusate sodium  200 mg Oral BID   donepezil  10 mg Oral QHS   feeding supplement  237 mL Oral BID BM   furosemide  60 mg Oral Daily   heparin  5,000 Units Subcutaneous Q8H   hydrALAZINE  50 mg Oral Q8H   insulin aspart  0-6 Units  Subcutaneous TID WC   ipratropium-albuterol  3 mL Nebulization Q6H   levothyroxine  50 mcg Oral Daily   loperamide  2 mg Oral Once   losartan  25 mg Oral Daily   metoprolol tartrate  50 mg Oral BID   morphine  15 mg Oral Q12H   oseltamivir  75 mg Oral Once   pantoprazole   40 mg Oral Daily   potassium chloride  20 mEq Oral Once   predniSONE  20 mg Oral Q breakfast   spironolactone  50 mg Oral Daily   Continuous Infusions:  lactated ringers     potassium PHOSPHATE IVPB (in mmol)     PRN Meds:.acetaminophen **OR** acetaminophen, fluticasone, hydrALAZINE, ipratropium-albuterol, naLOXone (NARCAN)  injection, ondansetron (ZOFRAN) IV, mouth rinse, oxyCODONE     Objective:   Vitals:   06/19/23 0500 06/19/23 0557 06/19/23 0750 06/19/23 0800  BP:  (!) 148/71 97/70 139/78  Pulse:  93 (!) 102 (!) 101  Resp:  18 20 15   Temp:  98.6 F (37 C) 99.3 F (37.4 C)   TempSrc:  Oral Oral   SpO2:  96% 97% 98%  Weight: 102.3 kg     Height:        Wt Readings from Last 3 Encounters:  06/19/23 102.3 kg  06/12/23 103.1 kg  06/02/23 105.9 kg     Intake/Output Summary (Last 24 hours) at 06/19/2023 0954 Last data filed at 06/18/2023 1802 Gross per 24 hour  Intake 240 ml  Output 400 ml  Net -160 ml     Physical Exam  Awake Alert, No new F.N deficits, Normal affect [place].AT,PERRAL Supple Neck, No JVD,   Symmetrical Chest wall movement, Good air movement bilaterally, CTAB RRR,No Gallops,Rubs or new Murmurs,  +ve B.Sounds, Abd Soft, No tenderness,   No Cyanosis, Clubbing or edema     RN pressure injury documentation: Pressure Injury 03/30/21 Ischial tuberosity Right;Left;Posterior Stage 1 -  Intact skin with non-blanchable redness of a localized area usually over a bony prominence. (Active)  03/30/21 1800  Location: Ischial tuberosity  Location Orientation: Right;Left;Posterior  Staging: Stage 1 -  Intact skin with non-blanchable redness of a localized area usually over a bony prominence.  Wound Description (Comments):   Present on Admission: Yes      Data Review:    Recent Labs  Lab 06/13/23 1714 06/13/23 1734 06/13/23 1740 06/14/23 0315 06/17/23 0424 06/18/23 0450 06/19/23 0535  WBC 12.3*  --   --  13.6* 9.7 8.3 7.0  HGB 10.9*   < > 12.2  11.0* 10.2* 10.2* 9.9*  HCT 36.3   < > 36.0 35.3* 32.1* 32.0* 31.2*  PLT 148*  --   --  139* 123* 122* 156  MCV 101.7*  --   --  98.1 94.7 96.7 95.7  MCH 30.5  --   --  30.6 30.1 30.8 30.4  MCHC 30.0  --   --  31.2 31.8 31.9 31.7  RDW 14.7  --   --  14.7 14.6 14.6 14.7  LYMPHSABS 2.8  --   --   --   --  1.4 1.6  MONOABS 1.2*  --   --   --   --  0.9 0.9  EOSABS 0.1  --   --   --   --  0.1 0.1  BASOSABS 0.0  --   --   --   --  0.0 0.0   < > = values in this interval not displayed.  Recent Labs  Lab 06/13/23 1714 06/13/23 1734 06/13/23 1735 06/13/23 1740 06/13/23 1939 06/14/23 0023 06/14/23 0139 06/14/23 0315 06/14/23 0316 06/14/23 0456 06/17/23 0424 06/18/23 0450 06/19/23 0535  NA 139   < >  --  138  --   --   --  136  --   --  136 137 138  K 3.6   < >  --  3.8  --   --   --  3.5  --   --  2.8* 3.4* 3.4*  CL 100  --   --  100  --   --   --  102  --   --  103 103 101  CO2 26  --   --   --   --   --   --  26  --   --  25 27 27   ANIONGAP 13  --   --   --   --   --   --  8  --   --  8 7 10   GLUCOSE 118*  --   --  109*  --   --   --  163*  --   --  93 87 89  BUN 13  --   --  17  --   --   --  12  --   --  8 8 8   CREATININE 1.25*  --   --  1.30*  --   --   --  1.12*  --   --  0.63 0.59 0.65  AST 23  --   --   --   --   --   --   --   --   --   --  26 25  ALT 19  --   --   --   --   --   --   --   --   --   --  23 25  ALKPHOS 72  --   --   --   --   --   --   --   --   --   --  55 48  BILITOT 0.4  --   --   --   --   --   --   --   --   --   --  0.8 0.5  ALBUMIN 2.8*  --   --   --   --   --   --   --   --   --   --  2.2* 2.2*  CRP  --   --   --   --   --   --   --   --   --   --  10.2*  --   --   PROCALCITON  --   --   --   --   --  0.20  --   --   --   --  0.15 0.13 0.11  LATICACIDVEN  --   --  4.0*  --  2.1* 1.9 1.1  --   --  1.0  --   --   --   INR  --   --   --   --   --  1.1  --   --   --   --   --   --   --   TSH  --   --   --   --   --  0.550  --   --   --   --   --   --    --   HGBA1C  --   --   --   --   --   --   --   --  5.6  --   --   --   --   AMMONIA  --   --   --   --   --  12  --   --   --   --   --   --   --   BNP  --   --   --   --   --   --   --   --   --   --  66.3 52.7 33.1  MG  --   --   --   --   --   --   --  1.7  --   --  1.8 1.7 1.7  PHOS  --   --   --   --   --   --   --  3.2  --   --   --  1.6* 2.0*  CALCIUM 8.2*  --   --   --   --   --   --  8.0*  --   --  7.6* 7.7* 7.5*   < > = values in this interval not displayed.      Recent Labs  Lab 06/13/23 1714 06/13/23 1735 06/13/23 1939 06/14/23 0023 06/14/23 0139 06/14/23 0315 06/14/23 0316 06/14/23 0456 06/17/23 0424 06/18/23 0450 06/19/23 0535  CRP  --   --   --   --   --   --   --   --  10.2*  --   --   PROCALCITON  --   --   --  0.20  --   --   --   --  0.15 0.13 0.11  LATICACIDVEN  --  4.0* 2.1* 1.9 1.1  --   --  1.0  --   --   --   INR  --   --   --  1.1  --   --   --   --   --   --   --   TSH  --   --   --  0.550  --   --   --   --   --   --   --   HGBA1C  --   --   --   --   --   --  5.6  --   --   --   --   AMMONIA  --   --   --  12  --   --   --   --   --   --   --   BNP  --   --   --   --   --   --   --   --  66.3 52.7 33.1  MG  --   --   --   --   --  1.7  --   --  1.8 1.7 1.7  CALCIUM 8.2*  --   --   --   --  8.0*  --   --  7.6* 7.7* 7.5*    --------------------------------------------------------------------------------------------------------------- Lab Results  Component Value Date   CHOL 146 09/29/2022   HDL 42 09/29/2022   LDLCALC 76 09/29/2022  LDLDIRECT 59.0 09/26/2020   TRIG 162 (H) 09/29/2022   CHOLHDL 3.5 09/29/2022    Lab Results  Component Value Date   HGBA1C 5.6 06/14/2023    Radiology Reports DG Abd Portable 1V Result Date: 06/17/2023 CLINICAL DATA:  Shortness of breath and abdominal pain EXAM: PORTABLE ABDOMEN - 1 VIEW COMPARISON:  12/15/2022 FINDINGS: Diffuse gaseous distension of colon measuring at least 7.5 cm at the distal  transverse segment, proximal colon possibly measuring up to 10 cm in diameter. No evidence of pneumatosis or small bowel obstruction. No concerning intra-abdominal mass effect or calcification. IMPRESSION: Gas distended colon, usually ileus. Electronically Signed   By: Tiburcio Pea M.D.   On: 06/17/2023 07:00   DG Chest Port 1 View Result Date: 06/17/2023 CLINICAL DATA:  Shortness of breath EXAM: PORTABLE CHEST 1 VIEW COMPARISON:  Four days ago FINDINGS: Indistinct density in the lower lungs which is stable. There is chronic lung disease by prior chest CT at the bases. Heart size is stable when allowing for significant distortion from rotation. Trace pleural fluid is possible on the right. No pneumothorax. IMPRESSION: Stable bilateral pulmonary opacity at least partially related to chronic lung disease by prior chest CT, with possible superimposed pneumonia. Electronically Signed   By: Tiburcio Pea M.D.   On: 06/17/2023 06:58      Signature  -   Susa Raring M.D on 06/19/2023 at 9:54 AM   -  To page go to [email or web address]

## 2023-06-19 NOTE — Plan of Care (Signed)

## 2023-06-19 NOTE — NC FL2 (Signed)
[place] MEDICAID FL2 LEVEL OF CARE FORM     IDENTIFICATION  Patient Name: [name] Birthdate: 1940/10/06 Sex: female Admission Date (Current Location): 06/13/2023  [facility] and [place] Number:  Producer, television/film/video and Address:  [facility], [address], [place], [place] [address]      Provider Number: [id number]  Attending Physician Name and Address:  Leroy Sea, MD  Relative Name and Phone Number:       Current Level of Care: Hospital Recommended Level of Care: Skilled Nursing Facility Prior Approval Number:    Date Approved/Denied:   PASRR Number: [id number]  Discharge Plan: SNF    Current Diagnoses: Patient Active Problem List   Diagnosis Date Noted   Septic shock (HCC) 06/13/2023   Lower GI bleed 12/16/2022   BRBPR (bright red blood per rectum) 12/15/2022   [name] 09/08/2022   Abnormal CT scan, sigmoid colon 04/14/2022   Colon stricture (HCC) 04/14/2022   Diverticulosis of colon without hemorrhage 04/14/2022   Pulmonary embolism (HCC) 11/15/2021   Tachycardia 10/22/2021   Pulmonary HTN (HCC)    Unspecified protein-calorie malnutrition (HCC) 04/05/2021   Pressure injury of skin 03/31/2021   UTI (urinary tract infection) 03/31/2021   Acute encephalopathy 03/30/2021   Thrombocytopenia (HCC) 03/30/2021   Diabetic neuropathy (HCC) 05/24/2019   Hypokalemia 04/21/2019   Anticoagulated 06/25/2018   Chronic pain 06/25/2018   CRI (chronic renal insufficiency), stage 3 (moderate) (HCC) 06/25/2018   Drug-induced constipation 06/18/2018   DVT (deep venous thrombosis) (HCC) 06/03/2018   Primary osteoarthritis of both knees 05/26/2018   Body mass index (BMI) of 50-59.9 in adult (HCC) 05/26/2018   Acquired hypothyroidism 05/26/2018   Chronic bilateral low back pain without sciatica 02/17/2018   Astigmatism with presbyopia, bilateral 03/26/2017   Cortical age-related cataract of both  eyes 03/26/2017   Family history of glaucoma 03/26/2017   Long term current use of oral hypoglycemic drug 03/26/2017   Nuclear sclerotic cataract of both eyes 03/26/2017   Pain in left hip 02/18/2017   Closed nondisplaced intertrochanteric fracture of left femur with delayed healing 01/14/2017   Gout 11/29/2016   Depression 11/29/2016   Closed left hip fracture (HCC) 11/29/2016   GERD (gastroesophageal reflux disease) 11/29/2016   Closed intertrochanteric fracture of hip, left, initial encounter (HCC) 11/29/2016   Physical deconditioning 07/31/2015   Pulmonary fibrosis, postinflammatory (HCC) 07/31/2015   Bronchiectasis without complication (HCC) 07/31/2015   Tendinitis of left rotator cuff 06/13/2015   Chronic diastolic CHF (congestive heart failure) (HCC) 04/21/2015   Acute kidney injury superimposed on chronic kidney disease (HCC) 04/17/2015   Chronic respiratory failure with hypoxia (HCC) 02/09/2015   Dyspnea and respiratory abnormality 01/03/2015   Normal coronary arteries 11/02/2014   Morbid obesity (HCC) 10/30/2014   Dysphagia 08/21/2014   Chronic fatigue 06/21/2014   DOE (dyspnea on exertion) 05/24/2014   Primary osteoarthritis of left knee 04/26/2014   High risk medication use 04/17/2014   Aphasia 02/12/2014   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) 01/18/2014   Lumbar facet arthropathy 01/18/2014   Bilateral edema of lower extremity 05/30/2013   Chronic cough 05/24/2013   ILD (interstitial lung disease) (HCC) 04/10/2013   Spinal stenosis of lumbar region with radiculopathy 07/12/2012   Inability to walk 07/12/2012   Meningioma of left sphenoid wing involving cavernous sinus (HCC) 02/17/2012   Chest pain of uncertain etiology 01/28/2012   Rheumatoid arthritis (HCC) 01/28/2012   Primary  osteoarthritis of right knee 01/28/2012   Dyslipidemia    Thyroid disease    Essential hypertension     Orientation RESPIRATION BLADDER Height & Weight     Self  O2  (3L [place]) Incontinent Weight: 225 lb 8.5 oz (102.3 kg) Height:  5\' 3"  (160 cm)  BEHAVIORAL SYMPTOMS/MOOD NEUROLOGICAL BOWEL NUTRITION STATUS      Incontinent    AMBULATORY STATUS COMMUNICATION OF NEEDS Skin   Extensive Assist Verbally Normal                       Personal Care Assistance Level of Assistance  Bathing, Feeding, Dressing Bathing Assistance: Maximum assistance Feeding assistance: Limited assistance Dressing Assistance: Maximum assistance     Functional Limitations Info  Sight, Hearing, Speech Sight Info: Adequate Hearing Info: Adequate Speech Info: Adequate    SPECIAL CARE FACTORS FREQUENCY  PT (By licensed PT), OT (By licensed OT)                    Contractures Contractures Info: Not present    Additional Factors Info  Code Status Code Status Info: FULL CODE             Current Medications (06/19/2023):  This is the current hospital active medication list Current Facility-Administered Medications  Medication Dose Route Frequency Provider Last Rate Last Admin   acetaminophen (TYLENOL) suppository 650 mg  650 mg Rectal Q4H PRN Hunsucker, Lesia Sago, MD       Or   acetaminophen (TYLENOL) tablet 650 mg  650 mg Oral Q4H PRN Hunsucker, Lesia Sago, MD   650 mg at 06/16/23 2015   allopurinol (ZYLOPRIM) tablet 300 mg  300 mg Oral Daily Leroy Sea, MD   300 mg at 06/19/23 0933   amLODipine (NORVASC) tablet 10 mg  10 mg Oral Daily Leroy Sea, MD   10 mg at 06/19/23 0933   atorvastatin (LIPITOR) tablet 10 mg  10 mg Oral QHS Leroy Sea, MD   10 mg at 06/18/23 2152   Chlorhexidine Gluconate Cloth 2 % PADS 6 each  6 each Topical Daily Hunsucker, Lesia Sago, MD   6 each at 06/19/23 0934   [START ON 06/20/2023] docusate sodium (COLACE) capsule 200 mg  200 mg Oral BID Leroy Sea, MD       donepezil (ARICEPT) tablet 10 mg  10 mg Oral QHS Leroy Sea, MD   10 mg at 06/18/23 2151   feeding supplement (ENSURE ENLIVE / ENSURE PLUS) liquid  237 mL  237 mL Oral BID BM Leroy Sea, MD   237 mL at 06/18/23 1509   fluticasone (FLONASE) 50 MCG/ACT nasal spray 2 spray  2 spray Each Nare Daily PRN Leroy Sea, MD       furosemide (LASIX) tablet 60 mg  60 mg Oral Daily Leroy Sea, MD   60 mg at 06/19/23 0933   heparin injection 5,000 Units  5,000 Units Subcutaneous Q8H Hunsucker, Lesia Sago, MD   5,000 Units at 06/19/23 0600   hydrALAZINE (APRESOLINE) injection 10 mg  10 mg Intravenous Q6H PRN Leroy Sea, MD       hydrALAZINE (APRESOLINE) tablet 50 mg  50 mg Oral Q8H Khatri, Pardeep, MD   50 mg at 06/19/23 0600   insulin aspart (novoLOG) injection 0-6 Units  0-6 Units Subcutaneous TID WC Hunsucker, Lesia Sago, MD   1 Units at 06/18/23 1742   ipratropium-albuterol (DUONEB) 0.5-2.5 (3) MG/3ML  nebulizer solution 3 mL  3 mL Nebulization Q6H PRN Hunsucker, Lesia Sago, MD   3 mL at 06/17/23 0932   ipratropium-albuterol (DUONEB) 0.5-2.5 (3) MG/3ML nebulizer solution 3 mL  3 mL Nebulization Q6H Leroy Sea, MD   3 mL at 06/18/23 1940   lactated ringers bolus 1,000 mL  1,000 mL Intravenous Once Hunsucker, Lesia Sago, MD       levothyroxine (SYNTHROID) tablet 50 mcg  50 mcg Oral Daily Hunsucker, Lesia Sago, MD   50 mcg at 06/19/23 1610   losartan (COZAAR) tablet 25 mg  25 mg Oral Daily Leroy Sea, MD   25 mg at 06/19/23 1045   metoprolol tartrate (LOPRESSOR) tablet 50 mg  50 mg Oral BID Hunsucker, Lesia Sago, MD   50 mg at 06/19/23 0934   morphine (MS CONTIN) 12 hr tablet 15 mg  15 mg Oral Q12H Khatri, Pardeep, MD   15 mg at 06/19/23 0933   naloxone [facility]) injection 2 mg  2 mg Intravenous PRN Hunsucker, Lesia Sago, MD   2 mg at 06/13/23 1655   ondansetron [facility]) injection 4 mg  4 mg Intravenous Q6H PRN Hunsucker, Lesia Sago, MD   4 mg at 06/18/23 1109   Oral care mouth rinse  15 mL Mouth Rinse PRN Hunsucker, Lesia Sago, MD       oseltamivir (TAMIFLU) capsule 75 mg  75 mg Oral Once Hunsucker, Lesia Sago, MD        oxyCODONE (Oxy IR/ROXICODONE) immediate release tablet 5 mg  5 mg Oral Q4H PRN Leroy Sea, MD       pantoprazole (PROTONIX) EC tablet 40 mg  40 mg Oral Daily Leroy Sea, MD   40 mg at 06/19/23 0934   potassium PHOSPHATE 30 mmol in dextrose 5 % 500 mL infusion  30 mmol Intravenous Once Leroy Sea, MD 85 mL/hr at 06/19/23 1049 30 mmol at 06/19/23 1049   predniSONE (DELTASONE) tablet 20 mg  20 mg Oral Q breakfast Leroy Sea, MD   20 mg at 06/19/23 0935   spironolactone (ALDACTONE) tablet 50 mg  50 mg Oral Daily Leroy Sea, MD   50 mg at 06/19/23 9604     Discharge Medications: Please see discharge summary for a list of discharge medications.  Relevant Imaging Results:  Relevant Lab Results:   Additional Information SSN: [id number]  Deatra [place], [place]

## 2023-06-19 NOTE — Telephone Encounter (Signed)
Routing to MR as [employer]

## 2023-06-19 NOTE — Telephone Encounter (Signed)
Patient is in the hospital and daughter would like for Dr.Ramaswamy to visit since the patient is still having complications. She has been in the hospital since January 25th. [facility] Room [phone].

## 2023-06-19 NOTE — TOC Progression Note (Signed)
Transition of Care [facility]) - Progression Note    Patient Details  Name: [name] MRN: [id number] Date of Birth: [birth year]  Transition of Care [facility]) CM/SW Contact  Dellie Burns [place], [place] Phone Number: 06/19/2023, 11:51 AM  Clinical Narrative: Spoke with pt's dtr Azucena Kuba re PT recommendation for SNF. Explained SNF placement process and answered questions. Dtr is agreeable to SNF for STR but is also requesting pt be considered for CIR if appropriate. Will begin SNF search and f/u with offers as available.   Dellie Burns, MSW, LCSW [phone] (coverage)      Expected Discharge Plan: Skilled Nursing Facility Barriers to Discharge: Continued Medical Work up  Expected Discharge Plan and Services In-house Referral: Clinical Social Work     Living arrangements for the past 2 months: Single Family Home                                       Social Determinants of Health (SDOH) Interventions SDOH Screenings   Food Insecurity: No Food Insecurity (06/16/2023)  Housing: Low Risk  (06/16/2023)  Transportation Needs: No Transportation Needs (06/16/2023)  Utilities: Not At Risk (06/16/2023)  Alcohol Screen: Low Risk  (10/27/2022)  Depression (PHQ2-9): High Risk (06/12/2023)  Financial Resource Strain: Low Risk  (06/11/2023)  Physical Activity: Inactive (06/11/2023)  Social Connections: Socially Integrated (06/16/2023)  Stress: No Stress Concern Present (10/27/2022)  Tobacco Use: Low Risk  (06/17/2023)    Readmission Risk Interventions    12/16/2022   11:09 AM  Readmission Risk Prevention Plan  Transportation Screening Complete  PCP or Specialist Appt within 5-7 Days Complete  Home Care Screening Complete  Medication Review (RN CM) Complete

## 2023-06-20 ENCOUNTER — Inpatient Hospital Stay (HOSPITAL_COMMUNITY): Payer: Medicare Other

## 2023-06-20 DIAGNOSIS — A419 Sepsis, unspecified organism: Secondary | ICD-10-CM | POA: Diagnosis not present

## 2023-06-20 DIAGNOSIS — R6521 Severe sepsis with septic shock: Secondary | ICD-10-CM | POA: Diagnosis not present

## 2023-06-20 LAB — COMPREHENSIVE METABOLIC PANEL
ALT: 26 U/L (ref 0–44)
AST: 41 U/L (ref 15–41)
Albumin: 2.4 g/dL — ABNORMAL LOW (ref 3.5–5.0)
Alkaline Phosphatase: 52 U/L (ref 38–126)
Anion gap: 17 — ABNORMAL HIGH (ref 5–15)
BUN: 9 mg/dL (ref 8–23)
CO2: 23 mmol/L (ref 22–32)
Calcium: 7.7 mg/dL — ABNORMAL LOW (ref 8.9–10.3)
Chloride: 99 mmol/L (ref 98–111)
Creatinine, Ser: 0.76 mg/dL (ref 0.44–1.00)
GFR, Estimated: >60 mL/min (ref >60)
Glucose, Bld: 82 mg/dL (ref 70–99)
Potassium: 4 mmol/L (ref 3.5–5.1)
Sodium: 139 mmol/L (ref 135–145)
Total Bilirubin: 1.1 mg/dL (ref 0.0–1.2)
Total Protein: 5 g/dL — ABNORMAL LOW (ref 6.5–8.1)

## 2023-06-20 LAB — CBC WITH DIFFERENTIAL/PLATELET
Abs Immature Granulocytes: 0.16 10*3/uL — ABNORMAL HIGH (ref 0.00–0.07)
Basophils Absolute: 0 10*3/uL (ref 0.0–0.1)
Basophils Relative: 0 %
Eosinophils Absolute: 0.1 10*3/uL (ref 0.0–0.5)
Eosinophils Relative: 1 %
HCT: 33.8 % — ABNORMAL LOW (ref 36.0–46.0)
Hemoglobin: 10.5 g/dL — ABNORMAL LOW (ref 12.0–15.0)
Immature Granulocytes: 2 %
Lymphocytes Relative: 30 %
Lymphs Abs: 2.4 10*3/uL (ref 0.7–4.0)
MCH: 29.7 pg (ref 26.0–34.0)
MCHC: 31.1 g/dL (ref 30.0–36.0)
MCV: 95.5 fL (ref 80.0–100.0)
Monocytes Absolute: 1 10*3/uL (ref 0.1–1.0)
Monocytes Relative: 12 %
Neutro Abs: 4.4 10*3/uL (ref 1.7–7.7)
Neutrophils Relative %: 55 %
Platelets: 237 10*3/uL (ref 150–400)
RBC: 3.54 MIL/uL — ABNORMAL LOW (ref 3.87–5.11)
RDW: 14.7 % (ref 11.5–15.5)
WBC: 8 10*3/uL (ref 4.0–10.5)
nRBC: 0 % (ref 0.0–0.2)

## 2023-06-20 LAB — GLUCOSE, CAPILLARY
Glucose-Capillary: 122 mg/dL — ABNORMAL HIGH (ref 70–99)
Glucose-Capillary: 132 mg/dL — ABNORMAL HIGH (ref 70–99)
Glucose-Capillary: 190 mg/dL — ABNORMAL HIGH (ref 70–99)
Glucose-Capillary: 104 mg/dL — ABNORMAL HIGH (ref 70–99)

## 2023-06-20 LAB — PROCALCITONIN: Procalcitonin: <0.1 ng/mL

## 2023-06-20 LAB — PHOSPHORUS: Phosphorus: 2.4 mg/dL — ABNORMAL LOW (ref 2.5–4.6)

## 2023-06-20 LAB — MAGNESIUM: Magnesium: 1.8 mg/dL (ref 1.7–2.4)

## 2023-06-20 LAB — LIPASE, BLOOD: Lipase: 44 U/L (ref 11–51)

## 2023-06-20 LAB — BRAIN NATRIURETIC PEPTIDE: B Natriuretic Peptide: 27.4 pg/mL (ref 0.0–100.0)

## 2023-06-20 MED ORDER — METOPROLOL TARTRATE 100 MG PO TABS
100.0000 mg | ORAL_TABLET | Freq: Two times a day (BID) | ORAL | Status: DC
  Administered 2023-06-20 (×2): 100 mg via ORAL
  Filled 2023-06-20 (×2): qty 1

## 2023-06-20 MED ORDER — HYDROMORPHONE HCL 1 MG/ML IJ SOLN
1.0000 mg | Freq: Once | INTRAMUSCULAR | Status: AC
  Administered 2023-06-20: 1 mg via INTRAVENOUS
  Filled 2023-06-20: qty 1

## 2023-06-20 NOTE — Progress Notes (Signed)
   06/20/23 1238  Mobility  Activity Transferred from bed to chair  Level of Assistance +2 (takes two people) (MaxA)  Assistive Device Stedy  Activity Response Tolerated fair  Mobility Referral Yes  Mobility visit 1 Mobility  Mobility Specialist Start Time (ACUTE ONLY) 1208  Mobility Specialist Stop Time (ACUTE ONLY) 1238  Mobility Specialist Time Calculation (min) (ACUTE ONLY) 30 min   Mobility Specialist: Progress Note  Pre-Mobility:      HR 101,SpO2 95% 3L Post-Mobility:    HR 103, SpO2 96% 3L  RN requested assistance - Pt agreeable to mobility session - received in bed. C/o BLE soreness and abd pain rated 8/10 - RN aware and present throughout session.  Returned to chair with all needs met - call bell within reach. Pt appeared very weak post mobility. Chair alarm on.   Barnie Mort, BS Mobility Specialist Please contact via SecureChat or  Rehab office at [phone].[name]

## 2023-06-20 NOTE — Progress Notes (Addendum)
Patient is complaining about abdominal pain.  She is having total 3 bowel movement today.  Denies any nausea and vomiting.  Per chart review patient is on chronic pain medications include continued 15 mg twice daily and oxycodone as needed.  Even with this pain is not improving.  Obtaining x-ray abdomen and checking lipase.   Tereasa Coop, MD [facility] 06/20/2023, 4:22 AM

## 2023-06-20 NOTE — Plan of Care (Signed)

## 2023-06-20 NOTE — Plan of Care (Signed)
  Problem: Health Behavior/Discharge Planning: Goal: Ability to manage health-related needs will improve Outcome: Progressing   Problem: Clinical Measurements: Goal: Will remain free from infection Outcome: Progressing   Problem: Activity: Goal: Risk for activity intolerance will decrease Outcome: Progressing   Problem: Elimination: Goal: Will not experience complications related to bowel motility Outcome: Progressing   Problem: Pain Managment: Goal: General experience of comfort will improve and/or be controlled Outcome: Progressing   Problem: Skin Integrity: Goal: Risk for impaired skin integrity will decrease Outcome: Progressing

## 2023-06-20 NOTE — Evaluation (Signed)
Clinical/Bedside Swallow Evaluation Patient Details  Name: [name] MRN: [id number] Date of Birth: [birth year]  Today's Date: 06/20/2023 Time: SLP Start Time (ACUTE ONLY): 1147 SLP Stop Time (ACUTE ONLY): 1157 SLP Time Calculation (min) (ACUTE ONLY): 10 min  Past Medical History:  Past Medical History:  Diagnosis Date   Acute on chronic diastolic (congestive) heart failure (HCC)    Anemia    iron deficiency anemia - secondary to blood loss ( chronic)    Anxiety    Arthritis    endstage changes bilateral knees/bilateral ankles.    Asthma    Carotid artery occlusion    Chronic fatigue    Chronic kidney disease    Closed left hip fracture (HCC)    Clotting disorder (HCC)    pt denies this   Contusion of left knee    due to fall 1/14.   COPD (chronic obstructive pulmonary disease) (HCC)    pulmonary fibrosis   Depression, reactive    Diabetes mellitus    type II    Diastolic dysfunction    Difficulty in walking    Family history of heart disease    Generalized muscle weakness    Gout    High cholesterol    History of falling    Hypertension    Hypothyroidism    Interstitial lung disease (HCC)    Meningioma of left sphenoid wing involving cavernous sinus (HCC) 02/17/2012   Continue diplopia, left eye pain and left headaches.     Morbid obesity (HCC)    Neuromuscular disorder (HCC)    diabetic neuropathy    Normal coronary arteries    cardiac catheterization performed  10/31/14   Pneumonia    RA (rheumatoid arthritis) (HCC)    has been off methotreaxte since 10/13.   Spinal stenosis of lumbar region    Thyroid disease    Unspecified lack of coordination    URI (upper respiratory infection)    Past Surgical History:  Past Surgical History:  Procedure Laterality Date   BRAIN SURGERY     Gamma knife 10/13. Needs repeat spring  '14   CARDIAC CATHETERIZATION N/A 10/31/2014   Procedure: Right/Left Heart Cath and Coronary Angiography;  Surgeon: Corky Crafts, MD;  Location: [facility] INVASIVE CV LAB;  Service: Cardiovascular;  Laterality: N/A;   COLONOSCOPY WITH PROPOFOL N/A 04/14/2022   Procedure: COLONOSCOPY WITH PROPOFOL;  Surgeon: Beverley Fiedler, MD;  Location: [facility] ENDOSCOPY;  Service: Gastroenterology;  Laterality: N/A;   CYST EXCISION     2 on face   CYST REMOVAL NECK     ESOPHAGOGASTRODUODENOSCOPY (EGD) WITH PROPOFOL N/A 09/14/2014   Procedure: ESOPHAGOGASTRODUODENOSCOPY (EGD) WITH PROPOFOL;  Surgeon: Louis Meckel, MD;  Location: [facility] ENDOSCOPY;  Service: Endoscopy;  Laterality: N/A;   INCISION AND DRAINAGE HIP Left 01/16/2017   Procedure: IRRIGATION AND DEBRIDEMENT LEFT HIP;  Surgeon: Kathryne Hitch, MD;  Location: [facility] ORS;  Service: Orthopedics;  Laterality: Left;   INTRAMEDULLARY (IM) NAIL INTERTROCHANTERIC Left 11/29/2016   Procedure: INTRAMEDULLARY (IM) NAIL INTERTROCHANTRIC;  Surgeon: Kathryne Hitch, MD;  Location: [facility] OR;  Service: Orthopedics;  Laterality: Left;   OVARY SURGERY     RIGHT HEART CATH N/A 08/12/2021   Procedure: RIGHT HEART CATH;  Surgeon: Runell Gess, MD;  Location: [facility] INVASIVE CV LAB;  Service: Cardiovascular;  Laterality: N/A;   SHOULDER SURGERY Left    TONSILLECTOMY  age 34   VAGINAL HYSTERECTOMY     VIDEO BRONCHOSCOPY Bilateral 05/31/2013   Procedure:  VIDEO BRONCHOSCOPY WITHOUT FLUORO;  Surgeon: Kalman Shan, MD;  Location: [facility] ENDOSCOPY;  Service: Cardiopulmonary;  Laterality: Bilateral;   video bronscoscopy  05/19/1998   lung   HPI:  Patient is a 83 year old female with pertinent PMH DMT2, HTN, chronic pain syndrome on morphine, RA on chronic steroids, ILD, bronchiectasis, chronic respiratory failure on O2, CKD 3, chronic diastolic HF, PE/DVT on Eliquis, hypothyroidism presents to [facility] ED on 1/25 with AMS found to have septic shock secondary to flu A RSV PNA.    Assessment / Plan / Recommendation  Clinical Impression  Pt presents with a mild oral dysphagia /grossly functional  swallow. Note upper dentures at baseline with missing lower dentition noted in molar regions bilaterally. Pt with hx of reduced oral intake since acute illness, still notes that presently. She was agreeable for small amounts of ice chips, thin liquids via cup and straw, puree, and small soft solid. No overt s/sx of aspiration noted with any POs. Pt with prolonged mastication of solids orally would likely benefit from softer solids with lack of full lower dentition. Continue dysphagia 3 (mechanical soft) and thin liquids. No further ST needs identified. SLP Visit Diagnosis: Dysphagia, unspecified (R13.10);Dysphagia, oral phase (R13.11)    Aspiration Risk  Mild aspiration risk    Diet Recommendation   Thin;Dysphagia 3 (mechanical soft)  Medication Administration: Whole meds with liquid    Other  Recommendations Oral Care Recommendations: Oral care BID    Recommendations for follow up therapy are one component of a multi-disciplinary discharge planning process, led by the attending physician.  Recommendations may be updated based on patient status, additional functional criteria and insurance authorization.  Follow up Recommendations Follow physician's recommendations for discharge plan and follow up therapies      Assistance Recommended at Discharge    Functional Status Assessment Patient has not had a recent decline in their functional status  Frequency and Duration            Prognosis Prognosis for improved oropharyngeal function: Good Barriers to Reach Goals: Time post onset      Swallow Study   General Date of Onset: 06/18/23 HPI: Patient is a 83 year old female with pertinent PMH DMT2, HTN, chronic pain syndrome on morphine, RA on chronic steroids, ILD, bronchiectasis, chronic respiratory failure on O2, CKD 3, chronic diastolic HF, PE/DVT on Eliquis, hypothyroidism presents to [facility] ED on 1/25 with AMS found to have septic shock secondary to flu A RSV PNA. Type of Study: Bedside  Swallow Evaluation Previous Swallow Assessment: none on file Diet Prior to this Study: Dysphagia 3 (mechanical soft);Thin liquids (Level 0) Temperature Spikes Noted: No Respiratory Status: Nasal cannula History of Recent Intubation: No Behavior/Cognition: Alert;Pleasant mood;Requires cueing Oral Cavity Assessment: Within Functional Limits Oral Care Completed by SLP: No Oral Cavity - Dentition: Dentures, top;Missing dentition Vision: Functional for self-feeding Self-Feeding Abilities: Needs set up Patient Positioning: Upright in bed Baseline Vocal Quality: Low vocal intensity Volitional Swallow: Able to elicit    Oral/Motor/Sensory Function Overall Oral Motor/Sensory Function: Generalized oral weakness   Ice Chips Ice chips: Within functional limits   Thin Liquid Thin Liquid: Within functional limits Presentation: Cup;Straw    Nectar Thick Nectar Thick Liquid: Not tested   Honey Thick Honey Thick Liquid: Not tested   Puree Puree: Within functional limits   Solid     Solid: Impaired Presentation: Spoon Oral Phase Functional Implications: Prolonged oral transit Pharyngeal Phase Impairments: Suspected delayed Swallow;Multiple swallows     Donovin Kraemer H. MA, CCC-SLP Acute Rehabilitation  Services   06/20/2023,12:01 PM

## 2023-06-20 NOTE — Progress Notes (Signed)
PROGRESS NOTE                                                                                                                                                                                                             Patient Demographics:    [name], is a 83 y.o. female, DOB - [birth year], ZOX:[id number]  Outpatient Primary MD for the patient is [name], MD    LOS - 7  Admit date - 06/13/2023    Chief Complaint  Patient presents with   Drug Overdose       Brief Narrative (HPI from H&P)   83 year old female with PMH significant for DMT2, HTN, chronic pain syndrome on morphine, RA on chronic steroids, ILD 3 L nasal cannula oxygen chronically, bronchiectasis, chronic respiratory failure on O2, CKD 3b, chronic diastolic HF, PE/DVT on Eliquis, hypothyroidism presents to [facility] ED on 1/25 with altered mental status, for suggestive of sepsis due to RSV pneumonia she was admitted to ICU on pressors stabilized and transferred to my care on 06/17/2023.   Subjective:  Patient in bed, appears comfortable, denies any headache, no fever, no chest pain or pressure, no shortness of breath , no abdominal pain. No focal weakness.   Assessment  & Plan :   Septic shock secondary to flu A / RSV pneumonia:  Possible concomitant bacterial infection but seems less likely.  [name] admitted to ICU required Levophed, now stabilized sepsis pathophysiology has resolved, finishing her Tamiflu course, also finishing 5 days of Rocephin, chronically on azithromycin at baseline which will be continued.  Blood cultures with 2 different staph species in 2 different sets likely contamination.  Urine culture noted sensitive to Rocephin which should suffice.  Overall much better, advance activity, PT OT, titrate down oxygen.  Encouraged to use I-S and flutter valve for pulmonary toiletry.  Clinically improving.   Chronic hypoxic respiratory failure: ILD.   History of rheumatoid arthritis.  Chronically on 3 L nasal cannula oxygen. History of rheumatoid arthritis / Interstitial lung disease:  She follows with Dr. Marchelle Gearing. Hold etanercept, plaquenil, leflunomide in setting of viral infection.  New home oral steroid at slightly higher dose due to acute stress, supportive care and monitor.   Acute encephalopathy due to severe sepsis: CT head nonacute, no headache or focal deficits.  With supportive care mentation has improved.  Continue to hold Flexeril, Cymbalta and gabapentin.  Continue oxycodone and chronic morphine for now with caution.  Narcan on board.   Chronic abdominal pain with chronic high-dose narcotic use.  Stable exam, appears to be in no distress, frequently gets overly sedated, discussed with patient's daughter and son in detail, narcotic dose gradually reduced.  Avoid narcotic overuse.  Chronically on as needed Narcan as well.    Chronic diastolic CHF/ HTN: Last 2D echo in 2023 showed LVEF of 55% with grade 1 diastolic dysfunction, Continue hydralazine 25 mg 3 times daily, continue beta-blocker, as needed hydralazine IV for blood pressure control.  See below.    AKI on CKD stage IIIa: Serum creatinine improved with IV fluids. Shock Resolved.  Encourage oral intake, resuming diuretics on 06/18/2023, ARB on 06/19/2023.   History of DVT/PE: Currently not on AC. Telemetry monitoring   Chronic anemia: H&H remains stable.  Monitor H&H.  No signs of obvious bleeding.   Hypothyroidism: Continue levothyroxine.   Hypertension.  Currently on combination of beta-blocker, hydralazine.  Will add low-dose Norvasc and monitor.  As needed hydralazine IV also added.    Hx of dementia: Aricept resumed.   Hypokalemia and hypophosphatemia.  Replaced.    Diabetes mellitus type II - SSI and CBG monitoring, Hb A1c 5.6.  Well-controlled.  Lab Results  Component Value Date   HGBA1C 5.6 06/14/2023   CBG (last 3)  Recent Labs    06/19/23 1605  06/19/23 2008 06/20/23 0906  GLUCAP 188* 128* 122*        Condition - Extremely Guarded  Family Communication  :  daughter bedside 06/17/23, 06/18/2023, family member updated at bedside on 06/19/2023.  Son Lyda Jester at [phone]  on 06/19/2023, daughter bedside 06/20/2023  Code Status :  Full  Consults  :  PCCM  PUD Prophylaxis : PPI   Procedures  :     TTE -  1. Left ventricular ejection fraction, by estimation, is 60 to 65%. The left ventricle has normal function. The left ventricle has no regional wall motion abnormalities. Left ventricular diastolic parameters are consistent with Grade I diastolic dysfunction (impaired relaxation).  2. Right ventricular systolic function is normal. The right ventricular size is normal. Tricuspid regurgitation signal is inadequate for assessing PA pressure.  3. The mitral valve is normal in structure. No evidence of mitral valve regurgitation. No evidence of mitral stenosis.  4. The aortic valve is tricuspid. There is mild calcification of the aortic valve. Aortic valve regurgitation is not visualized. No aortic stenosis is present.  5. IVC not visualized.  6. Exam truncated due to patient noncompliance  CT Head - Non acute      Disposition Plan  :    Status is: Inpatient  DVT Prophylaxis  :    heparin injection 5,000 Units Start: 06/13/23 2200    Lab Results  Component Value Date   PLT 237 06/20/2023    Diet :  Diet Order             DIET SOFT Room service appropriate? Yes; Fluid consistency: Thin  Diet effective now                    Inpatient Medications  Scheduled Meds:  allopurinol  300 mg Oral Daily   amLODipine  10 mg Oral Daily   atorvastatin  10 mg Oral QHS   Chlorhexidine Gluconate Cloth  6 each Topical Daily   docusate sodium  200 mg Oral BID   donepezil  10 mg Oral QHS   feeding supplement  237 mL Oral BID BM   furosemide  60 mg Oral Daily   heparin  5,000 Units Subcutaneous Q8H   hydrALAZINE  50 mg Oral Q8H    insulin aspart  0-6 Units Subcutaneous TID WC   levothyroxine  50 mcg Oral Daily   losartan  25 mg Oral Daily   metoprolol tartrate  100 mg Oral BID   morphine  15 mg Oral Q12H   oseltamivir  75 mg Oral Once   pantoprazole  40 mg Oral Daily   predniSONE  20 mg Oral Q breakfast   spironolactone  50 mg Oral Daily   Continuous Infusions:  lactated ringers     PRN Meds:.acetaminophen **OR** acetaminophen, fluticasone, hydrALAZINE, ipratropium-albuterol, naLOXone (NARCAN)  injection, ondansetron (ZOFRAN) IV, mouth rinse, oxyCODONE     Objective:   Vitals:   06/20/23 0447 06/20/23 0500 06/20/23 0635 06/20/23 0820  BP: 138/85  123/75 128/82  Pulse:   (!) 120   Resp:      Temp:      TempSrc:      SpO2:      Weight:  102 kg    Height:        Wt Readings from Last 3 Encounters:  06/20/23 102 kg  06/12/23 103.1 kg  06/02/23 105.9 kg     Intake/Output Summary (Last 24 hours) at 06/20/2023 1015 Last data filed at 06/20/2023 0332 Gross per 24 hour  Intake --  Output 1100 ml  Net -1100 ml     Physical Exam  Awake Alert, No new F.N deficits, Normal affect [place].AT,PERRAL Supple Neck, No JVD,   Symmetrical Chest wall movement, Good air movement bilaterally, CTAB RRR,No Gallops,Rubs or new Murmurs,  +ve B.Sounds, Abd Soft, No tenderness,   No Cyanosis, Clubbing or edema     RN pressure injury documentation: Pressure Injury 03/30/21 Ischial tuberosity Right;Left;Posterior Stage 1 -  Intact skin with non-blanchable redness of a localized area usually over a bony prominence. (Active)  03/30/21 1800  Location: Ischial tuberosity  Location Orientation: Right;Left;Posterior  Staging: Stage 1 -  Intact skin with non-blanchable redness of a localized area usually over a bony prominence.  Wound Description (Comments):   Present on Admission: Yes      Data Review:    Recent Labs  Lab 06/13/23 1714 06/13/23 1734 06/14/23 0315 06/17/23 0424 06/18/23 0450 06/19/23 0535  06/20/23 0531  WBC 12.3*  --  13.6* 9.7 8.3 7.0 8.0  HGB 10.9*   < > 11.0* 10.2* 10.2* 9.9* 10.5*  HCT 36.3   < > 35.3* 32.1* 32.0* 31.2* 33.8*  PLT 148*  --  139* 123* 122* 156 237  MCV 101.7*  --  98.1 94.7 96.7 95.7 95.5  MCH 30.5  --  30.6 30.1 30.8 30.4 29.7  MCHC 30.0  --  31.2 31.8 31.9 31.7 31.1  RDW 14.7  --  14.7 14.6 14.6 14.7 14.7  LYMPHSABS 2.8  --   --   --  1.4 1.6 2.4  MONOABS 1.2*  --   --   --  0.9 0.9 1.0  EOSABS 0.1  --   --   --  0.1 0.1 0.1  BASOSABS 0.0  --   --   --  0.0 0.0 0.0   < > = values in this interval not displayed.    Recent Labs  Lab 06/13/23 1714 06/13/23 1734 06/13/23 1735 06/13/23 1740 06/13/23 1939 06/14/23 0023 06/14/23 0139  06/14/23 0315 06/14/23 0316 06/14/23 0456 06/17/23 0424 06/18/23 0450 06/19/23 0535 06/20/23 0531  NA 139   < >  --    < >  --   --   --  136  --   --  136 137 138 139  K 3.6   < >  --    < >  --   --   --  3.5  --   --  2.8* 3.4* 3.4* 4.0  CL 100  --   --    < >  --   --   --  102  --   --  103 103 101 99  CO2 26  --   --   --   --   --   --  26  --   --  25 27 27 23   ANIONGAP 13  --   --   --   --   --   --  8  --   --  8 7 10  17*  GLUCOSE 118*  --   --    < >  --   --   --  163*  --   --  93 87 89 82  BUN 13  --   --    < >  --   --   --  12  --   --  8 8 8 9   CREATININE 1.25*  --   --    < >  --   --   --  1.12*  --   --  0.63 0.59 0.65 0.76  AST 23  --   --   --   --   --   --   --   --   --   --  26 25 41  ALT 19  --   --   --   --   --   --   --   --   --   --  23 25 26   ALKPHOS 72  --   --   --   --   --   --   --   --   --   --  55 48 52  BILITOT 0.4  --   --   --   --   --   --   --   --   --   --  0.8 0.5 1.1  ALBUMIN 2.8*  --   --   --   --   --   --   --   --   --   --  2.2* 2.2* 2.4*  CRP  --   --   --   --   --   --   --   --   --   --  10.2*  --   --   --   PROCALCITON  --   --   --   --   --  0.20  --   --   --   --  0.15 0.13 0.11 <0.10  LATICACIDVEN  --   --  4.0*  --  2.1* 1.9 1.1  --   --   1.0  --   --   --   --   INR  --   --   --   --   --  1.1  --   --   --   --   --   --   --   --  TSH  --   --   --   --   --  0.550  --   --   --   --   --   --   --   --   HGBA1C  --   --   --   --   --   --   --   --  5.6  --   --   --   --   --   AMMONIA  --   --   --   --   --  12  --   --   --   --   --   --   --   --   BNP  --   --   --   --   --   --   --   --   --   --  66.3 52.7 33.1 27.4  MG  --   --   --   --   --   --   --  1.7  --   --  1.8 1.7 1.7 1.8  PHOS  --   --   --   --   --   --   --  3.2  --   --   --  1.6* 2.0* 2.4*  CALCIUM 8.2*  --   --   --   --   --   --  8.0*  --   --  7.6* 7.7* 7.5* 7.7*   < > = values in this interval not displayed.      Recent Labs  Lab 06/13/23 1735 06/13/23 1939 06/14/23 0023 06/14/23 0139 06/14/23 0315 06/14/23 4782 06/14/23 0456 06/17/23 0424 06/18/23 0450 06/19/23 0535 06/20/23 0531  CRP  --   --   --   --   --   --   --  10.2*  --   --   --   PROCALCITON  --   --  0.20  --   --   --   --  0.15 0.13 0.11 <0.10  LATICACIDVEN 4.0* 2.1* 1.9 1.1  --   --  1.0  --   --   --   --   INR  --   --  1.1  --   --   --   --   --   --   --   --   TSH  --   --  0.550  --   --   --   --   --   --   --   --   HGBA1C  --   --   --   --   --  5.6  --   --   --   --   --   AMMONIA  --   --  12  --   --   --   --   --   --   --   --   BNP  --   --   --   --   --   --   --  66.3 52.7 33.1 27.4  MG  --   --   --   --  1.7  --   --  1.8 1.7 1.7 1.8  CALCIUM  --   --   --   --  8.0*  --   --  7.6* 7.7* 7.5* 7.7*    ---------------------------------------------------------------------------------------------------------------  Lab Results  Component Value Date   CHOL 146 09/29/2022   HDL 42 09/29/2022   LDLCALC 76 09/29/2022   LDLDIRECT 59.0 09/26/2020   TRIG 162 (H) 09/29/2022   CHOLHDL 3.5 09/29/2022    Lab Results  Component Value Date   HGBA1C 5.6 06/14/2023    Radiology Reports DG Abd Portable 1V Result Date:  06/17/2023 CLINICAL DATA:  Shortness of breath and abdominal pain EXAM: PORTABLE ABDOMEN - 1 VIEW COMPARISON:  12/15/2022 FINDINGS: Diffuse gaseous distension of colon measuring at least 7.5 cm at the distal transverse segment, proximal colon possibly measuring up to 10 cm in diameter. No evidence of pneumatosis or small bowel obstruction. No concerning intra-abdominal mass effect or calcification. IMPRESSION: Gas distended colon, usually ileus. Electronically Signed   By: Tiburcio Pea M.D.   On: 06/17/2023 07:00   DG Chest Port 1 View Result Date: 06/17/2023 CLINICAL DATA:  Shortness of breath EXAM: PORTABLE CHEST 1 VIEW COMPARISON:  Four days ago FINDINGS: Indistinct density in the lower lungs which is stable. There is chronic lung disease by prior chest CT at the bases. Heart size is stable when allowing for significant distortion from rotation. Trace pleural fluid is possible on the right. No pneumothorax. IMPRESSION: Stable bilateral pulmonary opacity at least partially related to chronic lung disease by prior chest CT, with possible superimposed pneumonia. Electronically Signed   By: Tiburcio Pea M.D.   On: 06/17/2023 06:58      Signature  -   Susa Raring M.D on 06/20/2023 at 10:15 AM   -  To page go to [email or web address]

## 2023-06-21 DIAGNOSIS — A419 Sepsis, unspecified organism: Secondary | ICD-10-CM | POA: Diagnosis not present

## 2023-06-21 DIAGNOSIS — R6521 Severe sepsis with septic shock: Secondary | ICD-10-CM | POA: Diagnosis not present

## 2023-06-21 LAB — CBC WITH DIFFERENTIAL/PLATELET
Abs Immature Granulocytes: 0.11 10*3/uL — ABNORMAL HIGH (ref 0.00–0.07)
Basophils Absolute: 0 10*3/uL (ref 0.0–0.1)
Basophils Relative: 0 %
Eosinophils Absolute: 0.1 10*3/uL (ref 0.0–0.5)
Eosinophils Relative: 1 %
HCT: 33.4 % — ABNORMAL LOW (ref 36.0–46.0)
Hemoglobin: 10.3 g/dL — ABNORMAL LOW (ref 12.0–15.0)
Immature Granulocytes: 1 %
Lymphocytes Relative: 28 %
Lymphs Abs: 2.6 10*3/uL (ref 0.7–4.0)
MCH: 29.6 pg (ref 26.0–34.0)
MCHC: 30.8 g/dL (ref 30.0–36.0)
MCV: 96 fL (ref 80.0–100.0)
Monocytes Absolute: 0.9 10*3/uL (ref 0.1–1.0)
Monocytes Relative: 10 %
Neutro Abs: 5.4 10*3/uL (ref 1.7–7.7)
Neutrophils Relative %: 60 %
Platelets: 260 10*3/uL (ref 150–400)
RBC: 3.48 MIL/uL — ABNORMAL LOW (ref 3.87–5.11)
RDW: 14.8 % (ref 11.5–15.5)
WBC: 9.1 10*3/uL (ref 4.0–10.5)
nRBC: 0 % (ref 0.0–0.2)

## 2023-06-21 LAB — GLUCOSE, CAPILLARY
Glucose-Capillary: 88 mg/dL (ref 70–99)
Glucose-Capillary: 143 mg/dL — ABNORMAL HIGH (ref 70–99)
Glucose-Capillary: 200 mg/dL — ABNORMAL HIGH (ref 70–99)
Glucose-Capillary: 162 mg/dL — ABNORMAL HIGH (ref 70–99)

## 2023-06-21 LAB — COMPREHENSIVE METABOLIC PANEL
ALT: 26 U/L (ref 0–44)
AST: 23 U/L (ref 15–41)
Albumin: 2.4 g/dL — ABNORMAL LOW (ref 3.5–5.0)
Alkaline Phosphatase: 47 U/L (ref 38–126)
Anion gap: 12 (ref 5–15)
BUN: 10 mg/dL (ref 8–23)
CO2: 28 mmol/L (ref 22–32)
Calcium: 7.9 mg/dL — ABNORMAL LOW (ref 8.9–10.3)
Chloride: 98 mmol/L (ref 98–111)
Creatinine, Ser: 0.69 mg/dL (ref 0.44–1.00)
GFR, Estimated: >60 mL/min (ref >60)
Glucose, Bld: 98 mg/dL (ref 70–99)
Potassium: 3.2 mmol/L — ABNORMAL LOW (ref 3.5–5.1)
Sodium: 138 mmol/L (ref 135–145)
Total Bilirubin: 0.6 mg/dL (ref 0.0–1.2)
Total Protein: 4.9 g/dL — ABNORMAL LOW (ref 6.5–8.1)

## 2023-06-21 LAB — PHOSPHORUS: Phosphorus: 2.1 mg/dL — ABNORMAL LOW (ref 2.5–4.6)

## 2023-06-21 LAB — BRAIN NATRIURETIC PEPTIDE: B Natriuretic Peptide: 16.4 pg/mL (ref 0.0–100.0)

## 2023-06-21 LAB — MAGNESIUM: Magnesium: 1.6 mg/dL — ABNORMAL LOW (ref 1.7–2.4)

## 2023-06-21 LAB — PROCALCITONIN: Procalcitonin: <0.1 ng/mL

## 2023-06-21 MED ORDER — FUROSEMIDE 40 MG PO TABS
60.0000 mg | ORAL_TABLET | Freq: Every day | ORAL | Status: DC
  Administered 2023-06-22: 60 mg via ORAL
  Filled 2023-06-21: qty 1

## 2023-06-21 MED ORDER — LOSARTAN POTASSIUM 50 MG PO TABS
25.0000 mg | ORAL_TABLET | Freq: Every day | ORAL | Status: DC
Start: 2023-06-22 — End: 2023-06-23
  Administered 2023-06-22: 25 mg via ORAL
  Filled 2023-06-21: qty 1

## 2023-06-21 MED ORDER — LACTATED RINGERS IV BOLUS: 500.0000 mL | Freq: Once | INTRAVENOUS | Status: DC

## 2023-06-21 MED ORDER — MAGNESIUM SULFATE 4 GM/100ML IV SOLN
4.0000 g | Freq: Once | INTRAVENOUS | Status: AC
  Administered 2023-06-21: 4 g via INTRAVENOUS
  Filled 2023-06-21: qty 100

## 2023-06-21 MED ORDER — LACTATED RINGERS IV BOLUS
1000.0000 mL | Freq: Once | INTRAVENOUS | Status: AC
  Administered 2023-06-21: 1000 mL via INTRAVENOUS

## 2023-06-21 MED ORDER — METOPROLOL TARTRATE 50 MG PO TABS
50.0000 mg | ORAL_TABLET | Freq: Two times a day (BID) | ORAL | Status: DC
Start: 2023-06-21 — End: 2023-06-21

## 2023-06-21 MED ORDER — METOPROLOL TARTRATE 50 MG PO TABS
50.0000 mg | ORAL_TABLET | Freq: Two times a day (BID) | ORAL | Status: DC
  Administered 2023-06-21 – 2023-06-26 (×11): 50 mg via ORAL
  Filled 2023-06-21 (×11): qty 1

## 2023-06-21 MED ORDER — SPIRONOLACTONE 25 MG PO TABS
50.0000 mg | ORAL_TABLET | Freq: Every day | ORAL | Status: DC
Start: 2023-06-22 — End: 2023-06-23
  Administered 2023-06-22: 50 mg via ORAL
  Filled 2023-06-21: qty 2

## 2023-06-21 MED ORDER — POTASSIUM CHLORIDE CRYS ER 20 MEQ PO TBCR
40.0000 meq | EXTENDED_RELEASE_TABLET | Freq: Two times a day (BID) | ORAL | Status: AC
  Administered 2023-06-21 (×2): 40 meq via ORAL
  Filled 2023-06-21 (×2): qty 2

## 2023-06-21 MED ORDER — HYDRALAZINE HCL 25 MG PO TABS: 25.0000 mg | ORAL_TABLET | Freq: Three times a day (TID) | ORAL | Status: DC

## 2023-06-21 NOTE — Plan of Care (Signed)
  Problem: Health Behavior/Discharge Planning: Goal: Ability to manage health-related needs will improve Outcome: Progressing   Problem: Clinical Measurements: Goal: Will remain free from infection Outcome: Progressing Goal: Respiratory complications will improve Outcome: Progressing   Problem: Activity: Goal: Risk for activity intolerance will decrease Outcome: Progressing   Problem: Elimination: Goal: Will not experience complications related to bowel motility Outcome: Progressing   Problem: Pain Managment: Goal: General experience of comfort will improve and/or be controlled Outcome: Progressing

## 2023-06-21 NOTE — Progress Notes (Signed)
PROGRESS NOTE                                                                                                                                                                                                             Patient Demographics:    [name], is a 83 y.o. female, DOB - 21-Jan-1941, YQM:[id number]  Outpatient Primary MD for the patient is [name], MD    LOS - 8  Admit date - 06/13/2023    Chief Complaint  Patient presents with   Drug Overdose       Brief Narrative (HPI from H&P)   83 year old female with PMH significant for DMT2, HTN, chronic pain syndrome on morphine, RA on chronic steroids, ILD 3 L nasal cannula oxygen chronically, bronchiectasis, chronic respiratory failure on O2, CKD 3b, chronic diastolic HF, PE/DVT on Eliquis, hypothyroidism presents to [facility] ED on 1/25 with altered mental status, for suggestive of sepsis due to RSV pneumonia she was admitted to ICU on pressors stabilized and transferred to my care on 06/17/2023.   Subjective:   Patient in bed, appears comfortable, denies any headache, no fever, no chest pain or pressure, no shortness of breath , no abdominal pain. No new focal weakness.    Assessment  & Plan :   Septic shock secondary to flu A / RSV pneumonia:  Possible concomitant bacterial infection but seems less likely.  [name] admitted to ICU required Levophed, now stabilized sepsis pathophysiology has resolved, finishing her Tamiflu course, also finishing 5 days of Rocephin, chronically on azithromycin at baseline which will be continued.  Blood cultures with 2 different staph species in 2 different sets likely contamination.  Urine culture noted sensitive to Rocephin which should suffice.  Overall much better, advance activity, PT OT, titrate down oxygen.  Encouraged to use I-S and flutter valve for pulmonary toiletry.  Clinically improving.   Chronic hypoxic respiratory  failure: ILD.  History of rheumatoid arthritis.  Chronically on 3 L nasal cannula oxygen. History of rheumatoid arthritis / Interstitial lung disease:  She follows with Dr. Marchelle Gearing. Hold etanercept, plaquenil, leflunomide in setting of viral infection.  New home oral steroid at slightly higher dose due to acute stress, supportive care and monitor.   Acute encephalopathy due to severe sepsis: CT head nonacute, no headache or focal deficits.  With supportive care mentation  has improved.  Continue to hold Flexeril, Cymbalta and gabapentin.  Continue oxycodone and chronic morphine for now with caution.  Narcan on board.   Chronic abdominal pain with chronic high-dose narcotic use.  Stable exam, appears to be in no distress, frequently gets overly sedated, discussed with patient's daughter and son in detail, narcotic dose gradually reduced.  Avoid narcotic overuse.  Chronically on as needed Narcan as well.    Chronic diastolic CHF/ HTN: Last 2D echo in 2023 showed LVEF of 55% with grade 1 diastolic dysfunction, Continue hydralazine 25 mg 3 times daily, continue beta-blocker, as needed hydralazine IV for blood pressure control.  See below.    AKI on CKD stage IIIa: Serum creatinine improved with IV fluids. Shock Resolved.  Encourage oral intake, resuming diuretics on 06/18/2023, ARB on 06/19/2023.   History of DVT/PE: Currently not on AC. Telemetry monitoring   Chronic anemia: H&H remains stable.  Monitor H&H.  No signs of obvious bleeding.   Hypothyroidism: Continue levothyroxine.   Hypertension.  Currently on combination of beta-blocker, hydralazine.  Will add low-dose Norvasc and monitor.  As needed hydralazine IV also added.  Medications adjusted on 06/21/2023 as technically mildly dehydrated, gentle IV fluids and monitor.  Hx of dementia: Aricept resumed.   Hypokalemia and hypophosphatemia.  Replaced.    Diabetes mellitus type II - SSI and CBG monitoring, Hb A1c 5.6.  Well-controlled.  Lab  Results  Component Value Date   HGBA1C 5.6 06/14/2023   CBG (last 3)  Recent Labs    06/20/23 1153 06/20/23 1651 06/20/23 2025  GLUCAP 132* 190* 104*        Condition - Extremely Guarded  Family Communication  :  daughter bedside 06/17/23, 06/18/2023, niece updated at bedside on 06/19/2023 and 06/21/2023.  Son Lyda Jester at [phone]  on 06/19/2023, daughter bedside 06/20/2023  Code Status :  Full  Consults  :  PCCM  PUD Prophylaxis : PPI   Procedures  :     TTE -  1. Left ventricular ejection fraction, by estimation, is 60 to 65%. The left ventricle has normal function. The left ventricle has no regional wall motion abnormalities. Left ventricular diastolic parameters are consistent with Grade I diastolic dysfunction (impaired relaxation).  2. Right ventricular systolic function is normal. The right ventricular size is normal. Tricuspid regurgitation signal is inadequate for assessing PA pressure.  3. The mitral valve is normal in structure. No evidence of mitral valve regurgitation. No evidence of mitral stenosis.  4. The aortic valve is tricuspid. There is mild calcification of the aortic valve. Aortic valve regurgitation is not visualized. No aortic stenosis is present.  5. IVC not visualized.  6. Exam truncated due to patient noncompliance  CT Head - Non acute      Disposition Plan  :    Status is: Inpatient  DVT Prophylaxis  :    heparin injection 5,000 Units Start: 06/13/23 2200    Lab Results  Component Value Date   PLT 260 06/21/2023    Diet :  Diet Order             DIET SOFT Room service appropriate? Yes with Assist; Fluid consistency: Thin  Diet effective now                    Inpatient Medications  Scheduled Meds:  allopurinol  300 mg Oral Daily   Chlorhexidine Gluconate Cloth  6 each Topical Daily   docusate sodium  200 mg Oral BID  donepezil  10 mg Oral QHS   feeding supplement  237 mL Oral BID BM   [START ON 06/22/2023] furosemide  60 mg Oral  Daily   heparin  5,000 Units Subcutaneous Q8H   insulin aspart  0-6 Units Subcutaneous TID WC   levothyroxine  50 mcg Oral Daily   [START ON 06/22/2023] losartan  25 mg Oral Daily   metoprolol tartrate  50 mg Oral BID   morphine  15 mg Oral Q12H   oseltamivir  75 mg Oral Once   pantoprazole  40 mg Oral Daily   predniSONE  20 mg Oral Q breakfast   [START ON 06/22/2023] spironolactone  50 mg Oral Daily   Continuous Infusions:   PRN Meds:.acetaminophen **OR** acetaminophen, fluticasone, hydrALAZINE, ipratropium-albuterol, naLOXone (NARCAN)  injection, ondansetron (ZOFRAN) IV, mouth rinse, oxyCODONE     Objective:   Vitals:   06/21/23 0500 06/21/23 0557 06/21/23 0601 06/21/23 0800  BP:  96/62 96/62 133/60  Pulse:  (!) 107  97  Resp:  20  16  Temp:  97.6 F (36.4 C)  98.1 F (36.7 C)  TempSrc:  Oral  Oral  SpO2:  96%  97%  Weight: 100 kg     Height:        Wt Readings from Last 3 Encounters:  06/21/23 100 kg  06/12/23 103.1 kg  06/02/23 105.9 kg     Intake/Output Summary (Last 24 hours) at 06/21/2023 0937 Last data filed at 06/21/2023 0530 Gross per 24 hour  Intake --  Output 1200 ml  Net -1200 ml     Physical Exam  Awake Alert, No new F.N deficits, Normal affect [place].AT,PERRAL Supple Neck, No JVD,   Symmetrical Chest wall movement, Good air movement bilaterally, CTAB RRR,No Gallops,Rubs or new Murmurs,  +ve B.Sounds, Abd Soft, No tenderness,   No Cyanosis, Clubbing or edema     RN pressure injury documentation: Pressure Injury 03/30/21 Ischial tuberosity Right;Left;Posterior Stage 1 -  Intact skin with non-blanchable redness of a localized area usually over a bony prominence. (Active)  03/30/21 1800  Location: Ischial tuberosity  Location Orientation: Right;Left;Posterior  Staging: Stage 1 -  Intact skin with non-blanchable redness of a localized area usually over a bony prominence.  Wound Description (Comments):   Present on Admission: Yes      Data Review:     Recent Labs  Lab 06/17/23 0424 06/18/23 0450 06/19/23 0535 06/20/23 0531 06/21/23 0843  WBC 9.7 8.3 7.0 8.0 9.1  HGB 10.2* 10.2* 9.9* 10.5* 10.3*  HCT 32.1* 32.0* 31.2* 33.8* 33.4*  PLT 123* 122* 156 237 260  MCV 94.7 96.7 95.7 95.5 96.0  MCH 30.1 30.8 30.4 29.7 29.6  MCHC 31.8 31.9 31.7 31.1 30.8  RDW 14.6 14.6 14.7 14.7 14.8  LYMPHSABS  --  1.4 1.6 2.4 2.6  MONOABS  --  0.9 0.9 1.0 0.9  EOSABS  --  0.1 0.1 0.1 0.1  BASOSABS  --  0.0 0.0 0.0 0.0    Recent Labs  Lab 06/17/23 0424 06/18/23 0450 06/19/23 0535 06/20/23 0531  NA 136 137 138 139  K 2.8* 3.4* 3.4* 4.0  CL 103 103 101 99  CO2 25 27 27 23   ANIONGAP 8 7 10  17*  GLUCOSE 93 87 89 82  BUN 8 8 8 9   CREATININE 0.63 0.59 0.65 0.76  AST  --  26 25 41  ALT  --  23 25 26   ALKPHOS  --  55 48 52  BILITOT  --  0.8 0.5 1.1  ALBUMIN  --  2.2* 2.2* 2.4*  CRP 10.2*  --   --   --   PROCALCITON 0.15 0.13 0.11 <0.10  BNP 66.3 52.7 33.1 27.4  MG 1.8 1.7 1.7 1.8  PHOS  --  1.6* 2.0* 2.4*  CALCIUM 7.6* 7.7* 7.5* 7.7*      Recent Labs  Lab 06/17/23 0424 06/18/23 0450 06/19/23 0535 06/20/23 0531  CRP 10.2*  --   --   --   PROCALCITON 0.15 0.13 0.11 <0.10  BNP 66.3 52.7 33.1 27.4  MG 1.8 1.7 1.7 1.8  CALCIUM 7.6* 7.7* 7.5* 7.7*    --------------------------------------------------------------------------------------------------------------- Lab Results  Component Value Date   CHOL 146 09/29/2022   HDL 42 09/29/2022   LDLCALC 76 09/29/2022   LDLDIRECT 59.0 09/26/2020   TRIG 162 (H) 09/29/2022   CHOLHDL 3.5 09/29/2022    Lab Results  Component Value Date   HGBA1C 5.6 06/14/2023    Radiology Reports DG Abd 1 View Result Date: 06/20/2023 CLINICAL DATA:  Abdominal pain EXAM: ABDOMEN - 1 VIEW COMPARISON:  Two days ago FINDINGS: Diffuse gaseous distension of colon measuring up to 9.5 cm in diameter proximally. No abnormal stool retention or visible pneumatosis. No evidence of small-bowel obstruction.  Reticulation at the lung bases is stable. IMPRESSION: Unchanged diffuse gaseous distension of the colon suggesting adynamic ileus. Electronically Signed   By: Tiburcio Pea M.D.   On: 06/20/2023 06:36      Signature  -   Susa Raring M.D on 06/21/2023 at 9:37 AM   -  To page go to [email or web address]

## 2023-06-21 NOTE — TOC Progression Note (Signed)
Transition of Care [facility]) - Progression Note    Patient Details  Name: [name] MRN: [id number] Date of Birth: [birth year]  [facility] Care [facility]) CM/SW Contact  Jimmy Picket, [place] Phone Number: 06/21/2023, 4:18 PM  Clinical Narrative:     CSW met with family at bedside. CSW re-explained SNF process and gave bed offers. Family did NOT want [facility] but will discuss others. Pt gave permission for CSW to follow up with son Lyda Jester or daughter [name].   Expected Discharge Plan: Skilled Nursing Facility Barriers to Discharge: Continued Medical Work up  Expected Discharge Plan and Services In-house Referral: Clinical Social Work     Living arrangements for the past 2 months: Single Family Home                                       Social Determinants of Health (SDOH) Interventions SDOH Screenings   Food Insecurity: No Food Insecurity (06/16/2023)  Housing: Low Risk  (06/16/2023)  Transportation Needs: No Transportation Needs (06/16/2023)  Utilities: Not At Risk (06/16/2023)  Alcohol Screen: Low Risk  (10/27/2022)  Depression (PHQ2-9): High Risk (06/12/2023)  Financial Resource Strain: Low Risk  (06/11/2023)  Physical Activity: Inactive (06/11/2023)  Social Connections: Socially Integrated (06/16/2023)  Stress: No Stress Concern Present (10/27/2022)  Tobacco Use: Low Risk  (06/17/2023)    Readmission Risk Interventions    12/16/2022   11:09 AM  Readmission Risk Prevention Plan  Transportation Screening Complete  PCP or Specialist Appt within 5-7 Days Complete  Home Care Screening Complete  Medication Review (RN CM) Complete

## 2023-06-22 ENCOUNTER — Inpatient Hospital Stay (HOSPITAL_COMMUNITY): Payer: Medicare Other

## 2023-06-22 DIAGNOSIS — A419 Sepsis, unspecified organism: Secondary | ICD-10-CM | POA: Diagnosis not present

## 2023-06-22 DIAGNOSIS — R6521 Severe sepsis with septic shock: Secondary | ICD-10-CM | POA: Diagnosis not present

## 2023-06-22 LAB — COMPREHENSIVE METABOLIC PANEL
ALT: 24 U/L (ref 0–44)
AST: 20 U/L (ref 15–41)
Albumin: 2.5 g/dL — ABNORMAL LOW (ref 3.5–5.0)
Alkaline Phosphatase: 49 U/L (ref 38–126)
Anion gap: 10 (ref 5–15)
BUN: 8 mg/dL (ref 8–23)
CO2: 28 mmol/L (ref 22–32)
Calcium: 8 mg/dL — ABNORMAL LOW (ref 8.9–10.3)
Chloride: 100 mmol/L (ref 98–111)
Creatinine, Ser: 0.61 mg/dL (ref 0.44–1.00)
GFR, Estimated: >60 mL/min (ref >60)
Glucose, Bld: 100 mg/dL — ABNORMAL HIGH (ref 70–99)
Potassium: 3.8 mmol/L (ref 3.5–5.1)
Sodium: 138 mmol/L (ref 135–145)
Total Bilirubin: 0.4 mg/dL (ref 0.0–1.2)
Total Protein: 5.2 g/dL — ABNORMAL LOW (ref 6.5–8.1)

## 2023-06-22 LAB — CBC WITH DIFFERENTIAL/PLATELET
Abs Immature Granulocytes: 0.09 10*3/uL — ABNORMAL HIGH (ref 0.00–0.07)
Basophils Absolute: 0 10*3/uL (ref 0.0–0.1)
Basophils Relative: 0 %
Eosinophils Absolute: 0.1 10*3/uL (ref 0.0–0.5)
Eosinophils Relative: 1 %
HCT: 35.8 % — ABNORMAL LOW (ref 36.0–46.0)
Hemoglobin: 11.1 g/dL — ABNORMAL LOW (ref 12.0–15.0)
Immature Granulocytes: 1 %
Lymphocytes Relative: 22 %
Lymphs Abs: 1.7 10*3/uL (ref 0.7–4.0)
MCH: 30.2 pg (ref 26.0–34.0)
MCHC: 31 g/dL (ref 30.0–36.0)
MCV: 97.3 fL (ref 80.0–100.0)
Monocytes Absolute: 0.8 10*3/uL (ref 0.1–1.0)
Monocytes Relative: 10 %
Neutro Abs: 5.3 10*3/uL (ref 1.7–7.7)
Neutrophils Relative %: 66 %
Platelets: 281 10*3/uL (ref 150–400)
RBC: 3.68 MIL/uL — ABNORMAL LOW (ref 3.87–5.11)
RDW: 14.6 % (ref 11.5–15.5)
WBC: 8 10*3/uL (ref 4.0–10.5)
nRBC: 0.3 % — ABNORMAL HIGH (ref 0.0–0.2)

## 2023-06-22 LAB — GLUCOSE, CAPILLARY
Glucose-Capillary: 102 mg/dL — ABNORMAL HIGH (ref 70–99)
Glucose-Capillary: 198 mg/dL — ABNORMAL HIGH (ref 70–99)
Glucose-Capillary: 151 mg/dL — ABNORMAL HIGH (ref 70–99)

## 2023-06-22 LAB — PROCALCITONIN: Procalcitonin: <0.1 ng/mL

## 2023-06-22 MED ORDER — POLYETHYLENE GLYCOL 3350 17 G PO PACK
17.0000 g | PACK | Freq: Two times a day (BID) | ORAL | Status: DC
Start: 2023-06-22 — End: 2023-06-27
  Administered 2023-06-22 – 2023-06-25 (×3): 17 g via ORAL
  Filled 2023-06-22 (×6): qty 1

## 2023-06-22 NOTE — NC FL2 (Signed)
[place] MEDICAID FL2 LEVEL OF CARE FORM     IDENTIFICATION  Patient Name: [name] Birthdate: 1941-04-30 Sex: female Admission Date (Current Location): 06/13/2023  [facility] and [place] Number:  Producer, television/film/video and Address:  [facility], [address], [place], [place] [address]      Provider Number: [id number]  Attending Physician Name and Address:  Leroy Sea, MD  Relative Name and Phone Number:       Current Level of Care: Hospital Recommended Level of Care: Skilled Nursing Facility Prior Approval Number:    Date Approved/Denied:   PASRR Number: [id number]  Discharge Plan: SNF    Current Diagnoses: Patient Active Problem List   Diagnosis Date Noted   Septic shock (HCC) 06/13/2023   Lower GI bleed 12/16/2022   BRBPR (bright red blood per rectum) 12/15/2022   [name] 09/08/2022   Abnormal CT scan, sigmoid colon 04/14/2022   Colon stricture (HCC) 04/14/2022   Diverticulosis of colon without hemorrhage 04/14/2022   Pulmonary embolism (HCC) 11/15/2021   Tachycardia 10/22/2021   Pulmonary HTN (HCC)    Unspecified protein-calorie malnutrition (HCC) 04/05/2021   Pressure injury of skin 03/31/2021   UTI (urinary tract infection) 03/31/2021   Acute encephalopathy 03/30/2021   Thrombocytopenia (HCC) 03/30/2021   Diabetic neuropathy (HCC) 05/24/2019   Hypokalemia 04/21/2019   Anticoagulated 06/25/2018   Chronic pain 06/25/2018   CRI (chronic renal insufficiency), stage 3 (moderate) (HCC) 06/25/2018   Drug-induced constipation 06/18/2018   DVT (deep venous thrombosis) (HCC) 06/03/2018   Primary osteoarthritis of both knees 05/26/2018   Body mass index (BMI) of 50-59.9 in adult (HCC) 05/26/2018   Acquired hypothyroidism 05/26/2018   Chronic bilateral low back pain without sciatica 02/17/2018   Astigmatism with presbyopia, bilateral 03/26/2017   Cortical age-related cataract of both  eyes 03/26/2017   Family history of glaucoma 03/26/2017   Long term current use of oral hypoglycemic drug 03/26/2017   Nuclear sclerotic cataract of both eyes 03/26/2017   Pain in left hip 02/18/2017   Closed nondisplaced intertrochanteric fracture of left femur with delayed healing 01/14/2017   Gout 11/29/2016   Depression 11/29/2016   Closed left hip fracture (HCC) 11/29/2016   GERD (gastroesophageal reflux disease) 11/29/2016   Closed intertrochanteric fracture of hip, left, initial encounter (HCC) 11/29/2016   Physical deconditioning 07/31/2015   Pulmonary fibrosis, postinflammatory (HCC) 07/31/2015   Bronchiectasis without complication (HCC) 07/31/2015   Tendinitis of left rotator cuff 06/13/2015   Chronic diastolic CHF (congestive heart failure) (HCC) 04/21/2015   Acute kidney injury superimposed on chronic kidney disease (HCC) 04/17/2015   Chronic respiratory failure with hypoxia (HCC) 02/09/2015   Dyspnea and respiratory abnormality 01/03/2015   Normal coronary arteries 11/02/2014   Morbid obesity (HCC) 10/30/2014   Dysphagia 08/21/2014   Chronic fatigue 06/21/2014   DOE (dyspnea on exertion) 05/24/2014   Primary osteoarthritis of left knee 04/26/2014   High risk medication use 04/17/2014   Aphasia 02/12/2014   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) 01/18/2014   Lumbar facet arthropathy 01/18/2014   Bilateral edema of lower extremity 05/30/2013   Chronic cough 05/24/2013   ILD (interstitial lung disease) (HCC) 04/10/2013   Spinal stenosis of lumbar region with radiculopathy 07/12/2012   Inability to walk 07/12/2012   Meningioma of left sphenoid wing involving cavernous sinus (HCC) 02/17/2012   Chest pain of uncertain etiology 01/28/2012   Rheumatoid arthritis (HCC) 01/28/2012   Primary  osteoarthritis of right knee 01/28/2012   Dyslipidemia    Thyroid disease    Essential hypertension     Orientation RESPIRATION BLADDER Height & Weight     Self,  Place  O2 (3L [place]) Incontinent, External catheter Weight: 220 lb 10.9 oz (100.1 kg) Height:  5\' 3"  (160 cm)  BEHAVIORAL SYMPTOMS/MOOD NEUROLOGICAL BOWEL NUTRITION STATUS      Incontinent Diet (see dc summary)  AMBULATORY STATUS COMMUNICATION OF NEEDS Skin   Extensive Assist Verbally Normal                       Personal Care Assistance Level of Assistance  Bathing, Feeding, Dressing Bathing Assistance: Maximum assistance Feeding assistance: Limited assistance Dressing Assistance: Maximum assistance     Functional Limitations Info  Sight, Hearing, Speech Sight Info: Adequate Hearing Info: Adequate Speech Info: Adequate    SPECIAL CARE FACTORS FREQUENCY  PT (By licensed PT), OT (By licensed OT)     PT Frequency: 5x/week OT Frequency: 5x/week            Contractures Contractures Info: Not present    Additional Factors Info  Code Status, Allergies, Isolation Precautions Code Status Info: Full Allergies Info: Codeine, Infliximab, Lisinopril, Ofev (Nintedanib)     Isolation Precautions Info: Flu+ 1/27     Current Medications (06/22/2023):  This is the current hospital active medication list Current Facility-Administered Medications  Medication Dose Route Frequency Provider Last Rate Last Admin   acetaminophen (TYLENOL) suppository 650 mg  650 mg Rectal Q4H PRN Hunsucker, Lesia Sago, MD       Or   acetaminophen (TYLENOL) tablet 650 mg  650 mg Oral Q4H PRN Hunsucker, Lesia Sago, MD   650 mg at 06/16/23 2015   allopurinol (ZYLOPRIM) tablet 300 mg  300 mg Oral Daily Leroy Sea, MD   300 mg at 06/22/23 8295   Chlorhexidine Gluconate Cloth 2 % PADS 6 each  6 each Topical Daily Hunsucker, Lesia Sago, MD   6 each at 06/19/23 6213   docusate sodium (COLACE) capsule 200 mg  200 mg Oral BID Leroy Sea, MD       donepezil (ARICEPT) tablet 10 mg  10 mg Oral QHS Leroy Sea, MD   10 mg at 06/21/23 2144   feeding supplement (ENSURE ENLIVE / ENSURE PLUS) liquid 237  mL  237 mL Oral BID BM Leroy Sea, MD   237 mL at 06/20/23 1729   fluticasone (FLONASE) 50 MCG/ACT nasal spray 2 spray  2 spray Each Nare Daily PRN Leroy Sea, MD       furosemide (LASIX) tablet 60 mg  60 mg Oral Daily Leroy Sea, MD   60 mg at 06/22/23 0816   heparin injection 5,000 Units  5,000 Units Subcutaneous Q8H Hunsucker, Lesia Sago, MD   5,000 Units at 06/22/23 0865   hydrALAZINE (APRESOLINE) injection 10 mg  10 mg Intravenous Q6H PRN Leroy Sea, MD       insulin aspart (novoLOG) injection 0-6 Units  0-6 Units Subcutaneous TID WC Hunsucker, Lesia Sago, MD   1 Units at 06/22/23 1327   ipratropium-albuterol (DUONEB) 0.5-2.5 (3) MG/3ML nebulizer solution 3 mL  3 mL Nebulization Q6H PRN Hunsucker, Lesia Sago, MD   3 mL at 06/19/23 1946   levothyroxine (SYNTHROID) tablet 50 mcg  50 mcg Oral Daily Hunsucker, Lesia Sago, MD   50 mcg at 06/22/23 7846   losartan (COZAAR) tablet 25 mg  25 mg  Oral Daily Leroy Sea, MD   25 mg at 06/22/23 0820   metoprolol tartrate (LOPRESSOR) tablet 50 mg  50 mg Oral BID Leroy Sea, MD   50 mg at 06/22/23 0820   morphine (MS CONTIN) 12 hr tablet 15 mg  15 mg Oral Q12H Willeen Niece, MD   15 mg at 06/22/23 0820   naloxone [facility]) injection 2 mg  2 mg Intravenous PRN Hunsucker, Lesia Sago, MD   2 mg at 06/13/23 1655   ondansetron [facility]) injection 4 mg  4 mg Intravenous Q6H PRN Hunsucker, Lesia Sago, MD   4 mg at 06/20/23 1246   Oral care mouth rinse  15 mL Mouth Rinse PRN Hunsucker, Lesia Sago, MD       oseltamivir (TAMIFLU) capsule 75 mg  75 mg Oral Once Hunsucker, Lesia Sago, MD       oxyCODONE (Oxy IR/ROXICODONE) immediate release tablet 5 mg  5 mg Oral Q4H PRN Janalyn Shy, Subrina, MD   5 mg at 06/22/23 0110   pantoprazole (PROTONIX) EC tablet 40 mg  40 mg Oral Daily Leroy Sea, MD   40 mg at 06/22/23 0820   polyethylene glycol (MIRALAX / GLYCOLAX) packet 17 g  17 g Oral BID Leroy Sea, MD       predniSONE (DELTASONE)  tablet 20 mg  20 mg Oral Q breakfast Leroy Sea, MD   20 mg at 06/22/23 1610   spironolactone (ALDACTONE) tablet 50 mg  50 mg Oral Daily Leroy Sea, MD   50 mg at 06/22/23 0820     Discharge Medications: Please see discharge summary for a list of discharge medications.  Relevant Imaging Results:  Relevant Lab Results:   Additional Information SSN: [id number]  Renne Crigler Yareli Carthen, LCSW

## 2023-06-22 NOTE — Progress Notes (Signed)
Physical Therapy Treatment Patient Details Name: [name] MRN: [id number] DOB: 04-28-1941 Today's Date: 06/22/2023   History of Present Illness 83 year old female presents to [facility] ED on 1/25 with altered mental status. Acute encephalopathy due to severe sepsis, +RSV, + Flu. PMH significant for DMT2, HTN, chronic pain syndrome on morphine, RA on chronic steroids, ILD, bronchiectasis, chronic respiratory failure on O2, CKD 3b, chronic diastolic HF, PE/DVT on Eliquis, hypothyroidism.    PT Comments  Pt assisted back to bed at request of nursing staff. Pt with similar presentation to earlier session. No change in DC/DME recs at this time. PT will continue to follow.     If plan is discharge home, recommend the following: Two people to help with walking and/or transfers;A lot of help with bathing/dressing/bathroom;Assistance with cooking/housework;Direct supervision/assist for medications management;Direct supervision/assist for financial management;Assist for transportation;Help with stairs or ramp for entrance   Can travel by private vehicle     No  Equipment Recommendations  None recommended by PT    Recommendations for Other Services       Precautions / Restrictions Precautions Precautions: Fall Restrictions Weight Bearing Restrictions Per Provider Order: No     Mobility  Bed Mobility Overal bed mobility: Needs Assistance Bed Mobility: Sit to Supine     Supine to sit: +2 for physical assistance, Max assist Sit to supine: +2 for physical assistance, Max assist   General bed mobility comments: +2 Max A via helicopter method.    Transfers Overall transfer level: Needs assistance Equipment used: Ambulation equipment used Transfers: Sit to/from Stand, Bed to chair/wheelchair/BSC Sit to Stand: +2 physical assistance, Max assist           General transfer comment: +2 Max A via stedy to chair. pt noted to fatigue very quickly. Transfer via Lift Equipment:  Stedy  Ambulation/Gait                   Stairs             Wheelchair Mobility     Tilt Bed    Modified Rankin (Stroke Patients Only)       Balance Overall balance assessment: Needs assistance Sitting-balance support: Bilateral upper extremity supported, Feet supported Sitting balance-Leahy Scale: Poor Sitting balance - Comments: leaning back on EOB mod to min A to sit   Standing balance support: Single extremity supported Standing balance-Leahy Scale: Poor Standing balance comment: Reliant on stedy                            Cognition Arousal: Alert Behavior During Therapy: Anxious Overall Cognitive Status: Difficult to assess                                 General Comments: initially conversive and appropriate then with mobility quieter, moaning and limited communication/childlike        Exercises      General Comments General comments (skin integrity, edema, etc.): VSS      Pertinent Vitals/Pain Pain Assessment Pain Assessment: Faces Faces Pain Scale: Hurts little more Pain Location: legs with movement Pain Descriptors / Indicators: Aching, Discomfort, Grimacing, Moaning Pain Intervention(s): Monitored during session    Home Living                          Prior Function  PT Goals (current goals can now be found in the care plan section) Progress towards PT goals: Progressing toward goals    Frequency    Min 1X/week      PT Plan      Co-evaluation              AM-PAC PT "6 Clicks" Mobility   Outcome Measure  Help needed turning from your back to your side while in a flat bed without using bedrails?: A Little Help needed moving from lying on your back to sitting on the side of a flat bed without using bedrails?: A Lot Help needed moving to and from a bed to a chair (including a wheelchair)?: Total Help needed standing up from a chair using your arms (e.g., wheelchair  or bedside chair)?: Total Help needed to walk in hospital room?: Total Help needed climbing 3-5 steps with a railing? : Total 6 Click Score: 9    End of Session Equipment Utilized During Treatment: Gait belt;Oxygen Activity Tolerance: Patient limited by fatigue;Patient tolerated treatment well Patient left: in chair;with call bell/phone within reach;with chair alarm set Nurse Communication: Mobility status PT Visit Diagnosis: Difficulty in walking, not elsewhere classified (R26.2);Muscle weakness (generalized) (M62.81)     Time: 1453-1510 PT Time Calculation (min) (ACUTE ONLY): 17 min  Charges:    $Therapeutic Activity: 8-22 mins PT General Charges $$ ACUTE PT VISIT: 1 Visit                     Shela Nevin, PT, DPT Acute Rehab Services [phone]    [name] 06/22/2023, 3:38 PM

## 2023-06-22 NOTE — TOC Progression Note (Addendum)
Transition of Care [facility]) - Progression Note    Patient Details  Name: [name] MRN: [id number] Date of Birth: [birth year]  Transition of Care [facility]) CM/SW Contact  Mearl Latin, LCSW Phone Number: 06/22/2023, 12:29 PM  Clinical Narrative:    12:29pm-CSW spoke with patient's son, Lyda Jester, to make him aware of medical stability likely tomorrow for SNF. Lyda Jester stated he will confer with his sister and call CSW back with top SNF choices.   2:20 PM-CSW received call from patient's daughter, [name], requesting to hear back from [facility] has declined). CSW awaiting response from [facility]. CSW answered questions about CIR and that patient was not recommended for that care level.   3:06 PM-[facility] still awaiting paperwork from [facility]. CSW contacted [facility] SW; awaiting response.    Expected Discharge Plan: Skilled Nursing Facility Barriers to Discharge: Continued Medical Work up  Expected Discharge Plan and Services In-house Referral: Clinical Social Work     Living arrangements for the past 2 months: Single Family Home                                       Social Determinants of Health (SDOH) Interventions SDOH Screenings   Food Insecurity: No Food Insecurity (06/16/2023)  Housing: Low Risk  (06/16/2023)  Transportation Needs: No Transportation Needs (06/16/2023)  Utilities: Not At Risk (06/16/2023)  Alcohol Screen: Low Risk  (10/27/2022)  Depression (PHQ2-9): High Risk (06/12/2023)  Financial Resource Strain: Low Risk  (06/11/2023)  Physical Activity: Inactive (06/11/2023)  Social Connections: Socially Integrated (06/16/2023)  Stress: No Stress Concern Present (10/27/2022)  Tobacco Use: Low Risk  (06/17/2023)    Readmission Risk Interventions    12/16/2022   11:09 AM  Readmission Risk Prevention Plan  Transportation Screening Complete  PCP or Specialist Appt within 5-7 Days Complete  Home Care Screening Complete  Medication Review (RN CM) Complete

## 2023-06-22 NOTE — Progress Notes (Signed)
Physical Therapy Treatment Patient Details Name: [name] MRN: [id number] DOB: 1940/09/22 Today's Date: 06/22/2023   History of Present Illness 83 year old female presents to [facility] ED on 1/25 with altered mental status. Acute encephalopathy due to severe sepsis, +RSV, + Flu. PMH significant for DMT2, HTN, chronic pain syndrome on morphine, RA on chronic steroids, ILD, bronchiectasis, chronic respiratory failure on O2, CKD 3b, chronic diastolic HF, PE/DVT on Eliquis, hypothyroidism.    PT Comments  Pt tolerated treatment well today. Pt able to transfer to chair via stedy with +2 Max A. No change in DC/DME recs at this time. PT will continue to follow.     If plan is discharge home, recommend the following: Two people to help with walking and/or transfers;A lot of help with bathing/dressing/bathroom;Assistance with cooking/housework;Direct supervision/assist for medications management;Direct supervision/assist for financial management;Assist for transportation;Help with stairs or ramp for entrance   Can travel by private vehicle     No  Equipment Recommendations  None recommended by PT    Recommendations for Other Services       Precautions / Restrictions Precautions Precautions: Fall Restrictions Weight Bearing Restrictions Per Provider Order: No     Mobility  Bed Mobility Overal bed mobility: Needs Assistance Bed Mobility: Supine to Sit     Supine to sit: +2 for physical assistance, Max assist     General bed mobility comments: +2 Max A via helicopter method.    Transfers Overall transfer level: Needs assistance Equipment used: Ambulation equipment used Transfers: Sit to/from Stand, Bed to chair/wheelchair/BSC Sit to Stand: +2 physical assistance, Max assist           General transfer comment: +2 Max A via stedy to chair. pt noted to fatigue very quickly. Transfer via Lift Equipment: Stedy  Ambulation/Gait                   Stairs              Wheelchair Mobility     Tilt Bed    Modified Rankin (Stroke Patients Only)       Balance Overall balance assessment: Needs assistance Sitting-balance support: Bilateral upper extremity supported, Feet supported Sitting balance-Leahy Scale: Poor Sitting balance - Comments: leaning back on EOB mod to min A to sit   Standing balance support: Single extremity supported Standing balance-Leahy Scale: Poor Standing balance comment: Reliant on stedy                            Cognition Arousal: Alert Behavior During Therapy: Anxious Overall Cognitive Status: Difficult to assess                                 General Comments: initially conversive and appropriate then with mobility quieter, moaning and limited communication/childlike        Exercises      General Comments General comments (skin integrity, edema, etc.): BP: 122/80 sitting in chair. Pt reported dizziness.      Pertinent Vitals/Pain Pain Assessment Pain Assessment: Faces Faces Pain Scale: Hurts little more Pain Location: legs with movement Pain Descriptors / Indicators: Aching, Discomfort, Grimacing, Moaning Pain Intervention(s): Monitored during session    Home Living                          Prior Function  PT Goals (current goals can now be found in the care plan section) Progress towards PT goals: Progressing toward goals    Frequency    Min 1X/week      PT Plan      Co-evaluation              AM-PAC PT "6 Clicks" Mobility   Outcome Measure  Help needed turning from your back to your side while in a flat bed without using bedrails?: A Little Help needed moving from lying on your back to sitting on the side of a flat bed without using bedrails?: A Lot Help needed moving to and from a bed to a chair (including a wheelchair)?: Total Help needed standing up from a chair using your arms (e.g., wheelchair or bedside chair)?:  Total Help needed to walk in hospital room?: Total Help needed climbing 3-5 steps with a railing? : Total 6 Click Score: 9    End of Session Equipment Utilized During Treatment: Gait belt;Oxygen Activity Tolerance: Patient limited by fatigue;Patient tolerated treatment well Patient left: in chair;with call bell/phone within reach;with chair alarm set Nurse Communication: Mobility status PT Visit Diagnosis: Difficulty in walking, not elsewhere classified (R26.2);Muscle weakness (generalized) (M62.81)     Time: 5409-8119 PT Time Calculation (min) (ACUTE ONLY): 18 min  Charges:    $Therapeutic Activity: 8-22 mins PT General Charges $$ ACUTE PT VISIT: 1 Visit                     Shela Nevin, PT, DPT Acute Rehab Services [phone]    [name] 06/22/2023, 2:46 PM

## 2023-06-22 NOTE — Progress Notes (Signed)
PROGRESS NOTE                                                                                                                                                                                                             Patient Demographics:    [name], is a 83 y.o. female, DOB - 1940/07/01, KVQ:[id number]  Outpatient Primary MD for the patient is [name], MD    LOS - 9  Admit date - 06/13/2023    Chief Complaint  Patient presents with   Drug Overdose       Brief Narrative (HPI from H&P)   83 year old female with PMH significant for DMT2, HTN, chronic pain syndrome on morphine, RA on chronic steroids, ILD 3 L nasal cannula oxygen chronically, bronchiectasis, chronic respiratory failure on O2, CKD 3b, chronic diastolic HF, PE/DVT on Eliquis, hypothyroidism presents to [facility] ED on 1/25 with altered mental status, for suggestive of sepsis due to RSV pneumonia she was admitted to ICU on pressors stabilized and transferred to my care on 06/17/2023.   Subjective:   P patient in bed denies any headache or chest pain, no shortness of breath,'s passing flatus, had a bowel movement this morning, abdomen slightly more distended than yesterday according to the patient, has chronic abdominal pain, no focal weakness.   Assessment  & Plan :   Septic shock secondary to flu A / RSV pneumonia:  Possible concomitant bacterial infection but seems less likely.  [name] admitted to ICU required Levophed, now stabilized sepsis pathophysiology has resolved, finishing her Tamiflu course, also finishing 5 days of Rocephin, chronically on azithromycin at baseline which will be continued.  Blood cultures with 2 different staph species in 2 different sets likely contamination.  Urine culture noted sensitive to Rocephin which should suffice.  Overall much better, advance activity, PT OT, titrate down oxygen.  Encouraged to use I-S and flutter valve  for pulmonary toiletry.  Clinically improving.   Chronic hypoxic respiratory failure: ILD.  History of rheumatoid arthritis.  Chronically on 3 L nasal cannula oxygen. History of rheumatoid arthritis / Interstitial lung disease:  She follows with Dr. Marchelle Gearing. Hold etanercept, plaquenil, leflunomide in setting of viral infection.  New home oral steroid at slightly higher dose due to acute stress, supportive care and monitor.   Acute encephalopathy due to severe sepsis: CT head  nonacute, no headache or focal deficits.  With supportive care mentation has improved.  Continue to hold Flexeril, Cymbalta and gabapentin.  Continue oxycodone and chronic morphine for now with caution.  Narcan on board.   Chronic abdominal pain with chronic high-dose narcotic use.  Stable exam, appears to be in no distress, frequently gets overly sedated, discussed with patient's daughter and son in detail, narcotic dose gradually reduced.  Avoid narcotic overuse.  Chronically on as needed Narcan as well.  Repeating KUB morning of 06/22/2023.  Chronic diastolic CHF/ HTN: Last 2D echo in 2023 showed LVEF of 55% with grade 1 diastolic dysfunction, Continue hydralazine 25 mg 3 times daily, continue beta-blocker, as needed hydralazine IV for blood pressure control.  See below.    AKI on CKD stage IIIa: Serum creatinine improved with IV fluids. Shock Resolved.  Encourage oral intake, resuming diuretics on 06/18/2023, ARB on 06/19/2023.   History of DVT/PE: Currently not on AC. Telemetry monitoring   Chronic anemia: H&H remains stable.  Monitor H&H.  No signs of obvious bleeding.   Hypothyroidism: Continue levothyroxine.   Hypertension.  Currently on combination of beta-blocker, hydralazine.  Will add low-dose Norvasc and monitor.  As needed hydralazine IV also added.  Medications adjusted on 06/21/2023 as technically mildly dehydrated, gentle IV fluids and monitor.  Hx of dementia: Aricept resumed.   Hypokalemia and  hypophosphatemia.  Replaced.    Diabetes mellitus type II - SSI and CBG monitoring, Hb A1c 5.6.  Well-controlled.  Lab Results  Component Value Date   HGBA1C 5.6 06/14/2023   CBG (last 3)  Recent Labs    06/21/23 1657 06/21/23 2026 06/22/23 0735  GLUCAP 200* 162* 102*        Condition - Extremely Guarded  Family Communication  :   Daughter bedside 06/17/23, 06/18/2023, 06/20/2023, 06/22/2023.    Niece updated at bedside on 06/19/2023 and 06/21/2023.    Son Lyda Jester at [phone]  on 06/19/2023,   Code Status :  Full  Consults  :  PCCM  PUD Prophylaxis : PPI   Procedures  :     TTE -  1. Left ventricular ejection fraction, by estimation, is 60 to 65%. The left ventricle has normal function. The left ventricle has no regional wall motion abnormalities. Left ventricular diastolic parameters are consistent with Grade I diastolic dysfunction (impaired relaxation).  2. Right ventricular systolic function is normal. The right ventricular size is normal. Tricuspid regurgitation signal is inadequate for assessing PA pressure.  3. The mitral valve is normal in structure. No evidence of mitral valve regurgitation. No evidence of mitral stenosis.  4. The aortic valve is tricuspid. There is mild calcification of the aortic valve. Aortic valve regurgitation is not visualized. No aortic stenosis is present.  5. IVC not visualized.  6. Exam truncated due to patient noncompliance  CT Head - Non acute      Disposition Plan  :    Status is: Inpatient  DVT Prophylaxis  :    heparin injection 5,000 Units Start: 06/13/23 2200    Lab Results  Component Value Date   PLT 281 06/22/2023    Diet :  Diet Order             DIET SOFT Room service appropriate? Yes with Assist; Fluid consistency: Thin  Diet effective now                    Inpatient Medications  Scheduled Meds:  allopurinol  300 mg Oral  Daily   Chlorhexidine Gluconate Cloth  6 each Topical Daily   docusate sodium   200 mg Oral BID   donepezil  10 mg Oral QHS   feeding supplement  237 mL Oral BID BM   furosemide  60 mg Oral Daily   heparin  5,000 Units Subcutaneous Q8H   insulin aspart  0-6 Units Subcutaneous TID WC   levothyroxine  50 mcg Oral Daily   losartan  25 mg Oral Daily   metoprolol tartrate  50 mg Oral BID   morphine  15 mg Oral Q12H   oseltamivir  75 mg Oral Once   pantoprazole  40 mg Oral Daily   polyethylene glycol  17 g Oral BID   predniSONE  20 mg Oral Q breakfast   spironolactone  50 mg Oral Daily   Continuous Infusions:   PRN Meds:.acetaminophen **OR** acetaminophen, fluticasone, hydrALAZINE, ipratropium-albuterol, naLOXone (NARCAN)  injection, ondansetron (ZOFRAN) IV, mouth rinse, oxyCODONE     Objective:   Vitals:   06/21/23 2337 06/22/23 0500 06/22/23 0516 06/22/23 0824  BP: 109/71  126/69 121/65  Pulse: 93  (!) 103   Resp: 18  16   Temp: 98.6 F (37 C)  97.9 F (36.6 C) 98.2 F (36.8 C)  TempSrc: Oral  Oral Oral  SpO2: 98%  97%   Weight:  100.1 kg    Height:        Wt Readings from Last 3 Encounters:  06/22/23 100.1 kg  06/12/23 103.1 kg  06/02/23 105.9 kg     Intake/Output Summary (Last 24 hours) at 06/22/2023 1007 Last data filed at 06/22/2023 0549 Gross per 24 hour  Intake --  Output 250 ml  Net -250 ml     Physical Exam  Awake Alert, No new F.N deficits, Normal affect [place].AT,PERRAL Supple Neck, No JVD,   Symmetrical Chest wall movement, Good air movement bilaterally, CTAB RRR,No Gallops,Rubs or new Murmurs,  +ve B.Sounds, Abd Soft with minimal distention, No tenderness,   No Cyanosis, Clubbing or edema     RN pressure injury documentation: Pressure Injury 03/30/21 Ischial tuberosity Right;Left;Posterior Stage 1 -  Intact skin with non-blanchable redness of a localized area usually over a bony prominence. (Active)  03/30/21 1800  Location: Ischial tuberosity  Location Orientation: Right;Left;Posterior  Staging: Stage 1 -  Intact skin with  non-blanchable redness of a localized area usually over a bony prominence.  Wound Description (Comments):   Present on Admission: Yes      Data Review:    Recent Labs  Lab 06/18/23 0450 06/19/23 0535 06/20/23 0531 06/21/23 0843 06/22/23 0440  WBC 8.3 7.0 8.0 9.1 8.0  HGB 10.2* 9.9* 10.5* 10.3* 11.1*  HCT 32.0* 31.2* 33.8* 33.4* 35.8*  PLT 122* 156 237 260 281  MCV 96.7 95.7 95.5 96.0 97.3  MCH 30.8 30.4 29.7 29.6 30.2  MCHC 31.9 31.7 31.1 30.8 31.0  RDW 14.6 14.7 14.7 14.8 14.6  LYMPHSABS 1.4 1.6 2.4 2.6 1.7  MONOABS 0.9 0.9 1.0 0.9 0.8  EOSABS 0.1 0.1 0.1 0.1 0.1  BASOSABS 0.0 0.0 0.0 0.0 0.0    Recent Labs  Lab 06/17/23 0424 06/18/23 0450 06/19/23 0535 06/20/23 0531 06/21/23 0843 06/22/23 0440  NA 136 137 138 139 138 138  K 2.8* 3.4* 3.4* 4.0 3.2* 3.8  CL 103 103 101 99 98 100  CO2 25 27 27 23 28 28   ANIONGAP 8 7 10  17* 12 10  GLUCOSE 93 87 89 82 98 100*  BUN 8  8 8 9 10 8   CREATININE 0.63 0.59 0.65 0.76 0.69 0.61  AST  --  26 25 41 23 20  ALT  --  23 25 26 26 24   ALKPHOS  --  55 48 52 47 49  BILITOT  --  0.8 0.5 1.1 0.6 0.4  ALBUMIN  --  2.2* 2.2* 2.4* 2.4* 2.5*  CRP 10.2*  --   --   --   --   --   PROCALCITON 0.15 0.13 0.11 <0.10 <0.10 <0.10  BNP 66.3 52.7 33.1 27.4 16.4  --   MG 1.8 1.7 1.7 1.8 1.6*  --   PHOS  --  1.6* 2.0* 2.4* 2.1*  --   CALCIUM 7.6* 7.7* 7.5* 7.7* 7.9* 8.0*      Recent Labs  Lab 06/17/23 0424 06/18/23 0450 06/19/23 0535 06/20/23 0531 06/21/23 0843 06/22/23 0440  CRP 10.2*  --   --   --   --   --   PROCALCITON 0.15 0.13 0.11 <0.10 <0.10 <0.10  BNP 66.3 52.7 33.1 27.4 16.4  --   MG 1.8 1.7 1.7 1.8 1.6*  --   CALCIUM 7.6* 7.7* 7.5* 7.7* 7.9* 8.0*    --------------------------------------------------------------------------------------------------------------- Lab Results  Component Value Date   CHOL 146 09/29/2022   HDL 42 09/29/2022   LDLCALC 76 09/29/2022   LDLDIRECT 59.0 09/26/2020   TRIG 162 (H) 09/29/2022    CHOLHDL 3.5 09/29/2022    Lab Results  Component Value Date   HGBA1C 5.6 06/14/2023    Radiology Reports DG Abd 1 View Result Date: 06/20/2023 CLINICAL DATA:  Abdominal pain EXAM: ABDOMEN - 1 VIEW COMPARISON:  Two days ago FINDINGS: Diffuse gaseous distension of colon measuring up to 9.5 cm in diameter proximally. No abnormal stool retention or visible pneumatosis. No evidence of small-bowel obstruction. Reticulation at the lung bases is stable. IMPRESSION: Unchanged diffuse gaseous distension of the colon suggesting adynamic ileus. Electronically Signed   By: Tiburcio Pea M.D.   On: 06/20/2023 06:36      Signature  -   Susa Raring M.D on 06/22/2023 at 10:07 AM   -  To page go to [email or web address]

## 2023-06-22 NOTE — Plan of Care (Signed)
  Problem: Health Behavior/Discharge Planning: Goal: Ability to manage health-related needs will improve Outcome: Progressing   Problem: Clinical Measurements: Goal: Will remain free from infection Outcome: Progressing Goal: Respiratory complications will improve Outcome: Progressing   Problem: Coping: Goal: Level of anxiety will decrease Outcome: Progressing   Problem: Pain Managment: Goal: General experience of comfort will improve and/or be controlled Outcome: Progressing   Problem: Skin Integrity: Goal: Risk for impaired skin integrity will decrease Outcome: Progressing

## 2023-06-23 ENCOUNTER — Inpatient Hospital Stay (HOSPITAL_COMMUNITY): Payer: Medicare Other

## 2023-06-23 DIAGNOSIS — R1084 Generalized abdominal pain: Secondary | ICD-10-CM

## 2023-06-23 DIAGNOSIS — R6521 Severe sepsis with septic shock: Secondary | ICD-10-CM | POA: Diagnosis not present

## 2023-06-23 DIAGNOSIS — A419 Sepsis, unspecified organism: Secondary | ICD-10-CM | POA: Diagnosis not present

## 2023-06-23 DIAGNOSIS — K567 Ileus, unspecified: Secondary | ICD-10-CM

## 2023-06-23 LAB — GLUCOSE, CAPILLARY
Glucose-Capillary: 107 mg/dL — ABNORMAL HIGH (ref 70–99)
Glucose-Capillary: 165 mg/dL — ABNORMAL HIGH (ref 70–99)
Glucose-Capillary: 175 mg/dL — ABNORMAL HIGH (ref 70–99)

## 2023-06-23 MED ORDER — FAMOTIDINE 20 MG PO TABS
20.0000 mg | ORAL_TABLET | Freq: Every day | ORAL | Status: DC
  Administered 2023-06-23 – 2023-06-25 (×3): 20 mg via ORAL
  Filled 2023-06-23 (×3): qty 1

## 2023-06-23 MED ORDER — PYRIDOSTIGMINE BROMIDE 60 MG PO TABS
60.0000 mg | ORAL_TABLET | Freq: Once | ORAL | Status: AC
  Administered 2023-06-23: 60 mg via ORAL
  Filled 2023-06-23: qty 1

## 2023-06-23 MED ORDER — SIMETHICONE 40 MG/0.6ML PO SUSP
80.0000 mg | Freq: Four times a day (QID) | ORAL | Status: DC | PRN
  Administered 2023-06-23: 80 mg via ORAL
  Filled 2023-06-23 (×3): qty 1.2

## 2023-06-23 MED ORDER — BISACODYL 10 MG RE SUPP
10.0000 mg | Freq: Once | RECTAL | Status: DC
  Filled 2023-06-23: qty 1

## 2023-06-23 MED ORDER — ALUM & MAG HYDROXIDE-SIMETH 200-200-20 MG/5ML PO SUSP
30.0000 mL | ORAL | Status: DC | PRN
  Administered 2023-06-23: 30 mL via ORAL
  Filled 2023-06-23: qty 30

## 2023-06-23 MED ORDER — LACTATED RINGERS IV SOLN: INTRAVENOUS | Status: AC

## 2023-06-23 MED ORDER — FUROSEMIDE 40 MG PO TABS
60.0000 mg | ORAL_TABLET | Freq: Every day | ORAL | Status: DC
  Administered 2023-06-24 – 2023-06-26 (×3): 60 mg via ORAL
  Filled 2023-06-23 (×3): qty 1

## 2023-06-23 MED ORDER — SPIRONOLACTONE 25 MG PO TABS
50.0000 mg | ORAL_TABLET | Freq: Every day | ORAL | Status: DC
  Administered 2023-06-24 – 2023-06-26 (×3): 50 mg via ORAL
  Filled 2023-06-23 (×3): qty 2

## 2023-06-23 NOTE — Progress Notes (Signed)
 Physical Therapy Treatment Patient Details Name: [name] MRN: [id number] DOB: 1940/12/13 Today's Date: 06/23/2023   History of Present Illness 83 year old female presents to [facility] ED on 1/25 with altered mental status. Acute encephalopathy due to severe sepsis, +RSV, + Flu. PMH significant for DMT2, HTN, chronic pain syndrome on morphine , RA on chronic steroids, ILD, bronchiectasis, chronic respiratory failure on O2, CKD 3b, chronic diastolic HF, PE/DVT on Eliquis , hypothyroidism.    PT Comments  Pt tolerated treatment well today. Pt with similar presentation to previous session requiring +2 Max A to transfer to chair via stedy. No change in DC/DME recs at this time. PT will continue to follow.     If plan is discharge home, recommend the following: Two people to help with walking and/or transfers;A lot of help with bathing/dressing/bathroom;Assistance with cooking/housework;Direct supervision/assist for medications management;Direct supervision/assist for financial management;Assist for transportation;Help with stairs or ramp for entrance   Can travel by private vehicle     No  Equipment Recommendations  None recommended by PT    Recommendations for Other Services       Precautions / Restrictions Precautions Precautions: Fall Restrictions Weight Bearing Restrictions Per Provider Order: No     Mobility  Bed Mobility Overal bed mobility: Needs Assistance Bed Mobility: Supine to Sit     Supine to sit: +2 for physical assistance, Max assist     General bed mobility comments: +2 Max A via helicopter method.    Transfers Overall transfer level: Needs assistance Equipment used: Ambulation equipment used Transfers: Sit to/from Stand, Bed to chair/wheelchair/BSC Sit to Stand: +2 physical assistance, Max assist           General transfer comment: +2 Max A via stedy to chair. pt noted to fatigue very quickly. Transfer via Lift Equipment: Stedy  Ambulation/Gait                    Stairs             Wheelchair Mobility     Tilt Bed    Modified Rankin (Stroke Patients Only)       Balance Overall balance assessment: Needs assistance Sitting-balance support: Bilateral upper extremity supported, Feet supported Sitting balance-Leahy Scale: Poor Sitting balance - Comments: leaning back on EOB mod to min A to sit   Standing balance support: Single extremity supported Standing balance-Leahy Scale: Poor Standing balance comment: Reliant on stedy                            Cognition Arousal: Alert Behavior During Therapy: Anxious Overall Cognitive Status: Difficult to assess                                 General Comments: initially conversive and appropriate then with mobility quieter, moaning and limited communication/childlike        Exercises      General Comments General comments (skin integrity, edema, etc.): Pt reported dizziness however BP: 132/80      Pertinent Vitals/Pain Pain Assessment Pain Assessment: Faces Faces Pain Scale: Hurts little more Pain Location: legs with movement Pain Descriptors / Indicators: Aching, Discomfort, Grimacing, Moaning Pain Intervention(s): Monitored during session    Home Living                          Prior Function  PT Goals (current goals can now be found in the care plan section) Progress towards PT goals: Progressing toward goals    Frequency    Min 1X/week      PT Plan      Co-evaluation              AM-PAC PT 6 Clicks Mobility   Outcome Measure  Help needed turning from your back to your side while in a flat bed without using bedrails?: A Little Help needed moving from lying on your back to sitting on the side of a flat bed without using bedrails?: A Lot Help needed moving to and from a bed to a chair (including a wheelchair)?: Total Help needed standing up from a chair using your arms (e.g.,  wheelchair or bedside chair)?: Total Help needed to walk in hospital room?: Total Help needed climbing 3-5 steps with a railing? : Total 6 Click Score: 9    End of Session Equipment Utilized During Treatment: Gait belt;Oxygen  Activity Tolerance: Patient limited by fatigue;Patient tolerated treatment well Patient left: in chair;with call bell/phone within reach;with chair alarm set Nurse Communication: Mobility status PT Visit Diagnosis: Difficulty in walking, not elsewhere classified (R26.2);Muscle weakness (generalized) (M62.81)     Time: 8874-8853 PT Time Calculation (min) (ACUTE ONLY): 21 min  Charges:    $Therapeutic Activity: 8-22 mins PT General Charges $$ ACUTE PT VISIT: 1 Visit                     Sylus Stgermain B, PT, DPT Acute Rehab Services [phone]    Devonn Giampietro 06/23/2023, 11:52 AM

## 2023-06-23 NOTE — Progress Notes (Signed)
   06/23/23 1318  Mobility  Activity Transferred from chair to bed  Level of Assistance +2 (takes two people) (ModA)  Assistive Device Stedy  Activity Response Tolerated fair  Mobility Referral Yes  Mobility visit 1 Mobility  Mobility Specialist Start Time (ACUTE ONLY) 1303  Mobility Specialist Stop Time (ACUTE ONLY) 1318  Mobility Specialist Time Calculation (min) (ACUTE ONLY) 15 min   Mobility Specialist: Progress Note Total Visits: 2   Pt agreeable to mobility session - received in bed. C/o BR urgency asking to use bedpan. MS assisted pt to bedpan requiring MaxA+2 to turn to the L. Left in bed on bedpan with all needs met - call bell within reach. Daughter present. RN and NT notified via secure chat.   Pt requested mobility session - received in bed. C/o wanting to get back to bed. PT with BLE soreness. TotalA for bed mobility, ModA+2 using stedy.  Returned to bed with all needs met - call bell within reach. NT present.   Virgle Boards, BS Mobility Specialist Please contact via SecureChat or  Rehab office at [phone].

## 2023-06-23 NOTE — Progress Notes (Addendum)
PROGRESS NOTE                                                                                                                                                                                                             Patient Demographics:    [name], is a 83 y.o. female, DOB - 14-Nov-1940, NGE:[id number]  Outpatient Primary MD for the patient is Joan Saint, MD    LOS - 10  Admit date - 06/13/2023    Chief Complaint  Patient presents with   Drug Overdose       Brief Narrative (HPI from H&P)   83 year old female with PMH significant for DMT2, HTN, chronic pain syndrome on morphine, RA on chronic steroids, ILD 3 L nasal cannula oxygen chronically, bronchiectasis, chronic respiratory failure on O2, CKD 3b, chronic diastolic HF, PE/DVT on Eliquis, hypothyroidism presents to [facility] ED on 1/25 with altered mental status, for suggestive of sepsis due to RSV pneumonia she was admitted to ICU on pressors stabilized and transferred to my care on 06/17/2023.   Subjective:   Patient in bed denies any headache or chest pain, no shortness of breath, no new weakness, last BM this morning, passing flatus but belly feels more distended, has chronic abdominal pain which is present, minimal nausea,   Assessment  & Plan :   Septic shock secondary to flu A / RSV pneumonia:  Possible concomitant bacterial infection but seems less likely.  [name] admitted to ICU required Levophed, now stabilized sepsis pathophysiology has resolved, finishing her Tamiflu course, also finishing 5 days of Rocephin, chronically on azithromycin at baseline which will be continued.  Blood cultures with 2 different staph species in 2 different sets likely contamination.  Urine culture noted sensitive to Rocephin which should suffice.  Overall much better, advance activity, PT OT, titrate down oxygen.  Encouraged to use I-S and flutter valve for pulmonary toiletry.   Clinically improving.   Chronic hypoxic respiratory failure: ILD.  History of rheumatoid arthritis.  Chronically on 3 L nasal cannula oxygen. History of rheumatoid arthritis / Interstitial lung disease:  She follows with Dr. Marchelle Mcdaniel. Hold etanercept, plaquenil, leflunomide in setting of viral infection.  New home oral steroid at slightly higher dose due to acute stress, supportive care and monitor.   Acute encephalopathy due to severe sepsis: CT head nonacute, no headache  or focal deficits.  With supportive care mentation has improved.  Continue to hold Flexeril, Cymbalta and gabapentin.  Continue oxycodone and chronic morphine for now with caution.  Narcan on board.   Chronic abdominal pain with chronic high-dose narcotic use.  Stable exam, appears to be in no distress, frequently gets overly sedated, discussed with patient's daughter and son in detail, narcotic dose gradually reduced.  Avoid narcotic overuse.  Chronically on as needed Narcan as well.  She has recurrent ileus and history of distal bowel stricture, having bowel movements, last BM on 06/23/2023 morning, passing flatus, ileus slightly worse, on bowel regimen will request GI to evaluate as well, will switch her diet  to full liquids.  Chronic diastolic CHF/ HTN: Last 2D echo in 2023 showed LVEF of 55% with grade 1 diastolic dysfunction, Continue hydralazine 25 mg 3 times daily, continue beta-blocker, as needed hydralazine IV for blood pressure control.  See below.    AKI on CKD stage IIIa: Serum creatinine improved with IV fluids. Shock Resolved.  Encourage oral intake, resuming diuretics on 06/18/2023, ARB on 06/19/2023.   History of DVT/PE: Currently not on AC. Telemetry monitoring   Chronic anemia: H&H remains stable.  Monitor H&H.  No signs of obvious bleeding.   Hypothyroidism: Continue levothyroxine.   Hypertension.  Currently on combination of beta-blocker, hydralazine.  Will add low-dose Norvasc and monitor.  As needed  hydralazine IV also added.  Medications adjusted on 06/21/2023 as technically mildly dehydrated, gentle IV fluids and monitor.  Hx of dementia: Aricept resumed.   Hypokalemia and hypophosphatemia.  Replaced.    Diabetes mellitus type II - SSI and CBG monitoring, Hb A1c 5.6.  Well-controlled.  Lab Results  Component Value Date   HGBA1C 5.6 06/14/2023   CBG (last 3)  Recent Labs    06/22/23 1307 06/22/23 1620 06/23/23 0927  GLUCAP 198* 151* 107*        Condition - Extremely Guarded  Family Communication  :   Daughter bedside 06/17/23, 06/18/2023, 06/20/2023, 06/22/2023, 06/23/23  Niece updated at bedside on 06/19/2023 and 06/21/2023.    Son [name] at [phone]  on 06/19/2023,   Code Status :  Full  Consults  :  PCCM  PUD Prophylaxis : PPI   Procedures  :     TTE -  1. Left ventricular ejection fraction, by estimation, is 60 to 65%. The left ventricle has normal function. The left ventricle has no regional wall motion abnormalities. Left ventricular diastolic parameters are consistent with Grade I diastolic dysfunction (impaired relaxation).  2. Right ventricular systolic function is normal. The right ventricular size is normal. Tricuspid regurgitation signal is inadequate for assessing PA pressure.  3. The mitral valve is normal in structure. No evidence of mitral valve regurgitation. No evidence of mitral stenosis.  4. The aortic valve is tricuspid. There is mild calcification of the aortic valve. Aortic valve regurgitation is not visualized. No aortic stenosis is present.  5. IVC not visualized.  6. Exam truncated due to patient noncompliance  CT Head - Non acute      Disposition Plan  :    Status is: Inpatient  DVT Prophylaxis  :    heparin injection 5,000 Units Start: 06/13/23 2200    Lab Results  Component Value Date   PLT 281 06/22/2023    Diet :  Diet Order             DIET SOFT Room service appropriate? Yes with Assist; Fluid consistency: Thin  Diet  effective now                    Inpatient Medications  Scheduled Meds:  allopurinol  300 mg Oral Daily   bisacodyl  10 mg Rectal Once   Chlorhexidine Gluconate Cloth  6 each Topical Daily   docusate sodium  200 mg Oral BID   donepezil  10 mg Oral QHS   feeding supplement  237 mL Oral BID BM   [START ON 06/24/2023] furosemide  60 mg Oral Daily   heparin  5,000 Units Subcutaneous Q8H   insulin aspart  0-6 Units Subcutaneous TID WC   levothyroxine  50 mcg Oral Daily   metoprolol tartrate  50 mg Oral BID   morphine  15 mg Oral Q12H   oseltamivir  75 mg Oral Once   pantoprazole  40 mg Oral Daily   polyethylene glycol  17 g Oral BID   predniSONE  20 mg Oral Q breakfast   [START ON 06/24/2023] spironolactone  50 mg Oral Daily   Continuous Infusions:  lactated ringers      PRN Meds:.acetaminophen **OR** acetaminophen, fluticasone, hydrALAZINE, ipratropium-albuterol, naLOXone (NARCAN)  injection, ondansetron (ZOFRAN) IV, mouth rinse, oxyCODONE, simethicone     Objective:   Vitals:   06/22/23 2358 06/23/23 0458 06/23/23 0500 06/23/23 0832  BP: 138/88 103/60  99/64  Pulse: (!) 111 (!) 110  (!) 123  Resp: 16 18  20   Temp: 98.3 F (36.8 C) 98 F (36.7 C)  98.1 F (36.7 C)  TempSrc: Oral Axillary  Oral  SpO2: 96% 98%  97%  Weight:   99.7 kg   Height:        Wt Readings from Last 3 Encounters:  06/23/23 99.7 kg  06/12/23 103.1 kg  06/02/23 105.9 kg     Intake/Output Summary (Last 24 hours) at 06/23/2023 1050 Last data filed at 06/23/2023 0502 Gross per 24 hour  Intake --  Output 300 ml  Net -300 ml     Physical Exam  Awake Alert, No new F.N deficits, Normal affect [place].AT,PERRAL Supple Neck, No JVD,   Symmetrical Chest wall movement, Good air movement bilaterally, CTAB RRR,No Gallops,Rubs or new Murmurs,  +ve B.Sounds, Abd with some distention, No tenderness,   No Cyanosis, Clubbing or edema     RN pressure injury documentation: Pressure Injury 03/30/21  Ischial tuberosity Right;Left;Posterior Stage 1 -  Intact skin with non-blanchable redness of a localized area usually over a bony prominence. (Active)  03/30/21 1800  Location: Ischial tuberosity  Location Orientation: Right;Left;Posterior  Staging: Stage 1 -  Intact skin with non-blanchable redness of a localized area usually over a bony prominence.  Wound Description (Comments):   Present on Admission: Yes      Data Review:    Recent Labs  Lab 06/18/23 0450 06/19/23 0535 06/20/23 0531 06/21/23 0843 06/22/23 0440  WBC 8.3 7.0 8.0 9.1 8.0  HGB 10.2* 9.9* 10.5* 10.3* 11.1*  HCT 32.0* 31.2* 33.8* 33.4* 35.8*  PLT 122* 156 237 260 281  MCV 96.7 95.7 95.5 96.0 97.3  MCH 30.8 30.4 29.7 29.6 30.2  MCHC 31.9 31.7 31.1 30.8 31.0  RDW 14.6 14.7 14.7 14.8 14.6  LYMPHSABS 1.4 1.6 2.4 2.6 1.7  MONOABS 0.9 0.9 1.0 0.9 0.8  EOSABS 0.1 0.1 0.1 0.1 0.1  BASOSABS 0.0 0.0 0.0 0.0 0.0    Recent Labs  Lab 06/17/23 0424 06/18/23 0450 06/19/23 0535 06/20/23 0531 06/21/23 0843 06/22/23 0440  NA 136 137 138 139  138 138  K 2.8* 3.4* 3.4* 4.0 3.2* 3.8  CL 103 103 101 99 98 100  CO2 25 27 27 23 28 28   ANIONGAP 8 7 10  17* 12 10  GLUCOSE 93 87 89 82 98 100*  BUN 8 8 8 9 10 8   CREATININE 0.63 0.59 0.65 0.76 0.69 0.61  AST  --  26 25 41 23 20  ALT  --  23 25 26 26 24   ALKPHOS  --  55 48 52 47 49  BILITOT  --  0.8 0.5 1.1 0.6 0.4  ALBUMIN  --  2.2* 2.2* 2.4* 2.4* 2.5*  CRP 10.2*  --   --   --   --   --   PROCALCITON 0.15 0.13 0.11 <0.10 <0.10 <0.10  BNP 66.3 52.7 33.1 27.4 16.4  --   MG 1.8 1.7 1.7 1.8 1.6*  --   PHOS  --  1.6* 2.0* 2.4* 2.1*  --   CALCIUM 7.6* 7.7* 7.5* 7.7* 7.9* 8.0*      Recent Labs  Lab 06/17/23 0424 06/18/23 0450 06/19/23 0535 06/20/23 0531 06/21/23 0843 06/22/23 0440  CRP 10.2*  --   --   --   --   --   PROCALCITON 0.15 0.13 0.11 <0.10 <0.10 <0.10  BNP 66.3 52.7 33.1 27.4 16.4  --   MG 1.8 1.7 1.7 1.8 1.6*  --   CALCIUM 7.6* 7.7* 7.5* 7.7* 7.9* 8.0*     --------------------------------------------------------------------------------------------------------------- Lab Results  Component Value Date   CHOL 146 09/29/2022   HDL 42 09/29/2022   LDLCALC 76 09/29/2022   LDLDIRECT 59.0 09/26/2020   TRIG 162 (H) 09/29/2022   CHOLHDL 3.5 09/29/2022    Lab Results  Component Value Date   HGBA1C 5.6 06/14/2023    Radiology Reports DG Abd Portable 1V Result Date: 06/23/2023 CLINICAL DATA:  83 year old female is constipated. EXAM: PORTABLE ABDOMEN - 1 VIEW COMPARISON:  06/22/2023 radiographs and earlier. FINDINGS: Portable AP supine view at 0752 hours. Gas Phil large and small bowel loops throughout the abdomen and pelvis redemonstrated, similar to multiple abdominal radiographs since 06/17/2023. No significant retained stool. Lung bases appear negative. Stable visualized osseous structures. Previous left femur ORIF. IMPRESSION: Ongoing gas distended small and large bowel loops. Differential considerations are ileus versus distal colonic obstruction (less likely given rectal gas on 06/20/2023 comparison. No significant retained stool. Electronically Signed   By: Odessa Fleming M.D.   On: 06/23/2023 08:45   DG Abd Portable 1V Result Date: 06/22/2023 CLINICAL DATA:  Constipation EXAM: PORTABLE ABDOMEN - 1 VIEW COMPARISON:  Two days ago FINDINGS: Generalized gaseous distension of colon and to a lesser extent affecting small bowel. No abnormal stool retention. No concerning mass effect or gas collection. The largest segment of gas dilated bowel is measured at the cecum, 11 cm and previously 9.4 cm. No clear volvulus pattern. IMPRESSION: Unchanged pattern of generalized gaseous distension of bowel. Greatest distension at the cecum where there is 11 cm in diameter, mildly progressed. Provided history of constipation, which is not seen radiographically. Electronically Signed   By: Tiburcio Pea M.D.   On: 06/22/2023 10:37   DG Abd 1 View Result Date:  06/20/2023 CLINICAL DATA:  Abdominal pain EXAM: ABDOMEN - 1 VIEW COMPARISON:  Two days ago FINDINGS: Diffuse gaseous distension of colon measuring up to 9.5 cm in diameter proximally. No abnormal stool retention or visible pneumatosis. No evidence of small-bowel obstruction. Reticulation at the lung bases is stable. IMPRESSION: Unchanged  diffuse gaseous distension of the colon suggesting adynamic ileus. Electronically Signed   By: Tiburcio Pea M.D.   On: 06/20/2023 06:36      Signature  -   Susa Raring M.D on 06/23/2023 at 10:50 AM   -  To page go to [email or web address]

## 2023-06-23 NOTE — Consult Note (Signed)
 Consultation Note   Referring Provider:  [facility] PCP: Mercer Clotilda SAUNDERS, MD Primary Gastroenterologist: Gustav Mcgee, MD        Reason for Consultation: Ileus  DOA: 06/13/2023         Hospital Day: 11   ASSESSMENT    Brief Narrative:  83 y.o. year old female with chronic GERD, diverticular disease complicated by sigmoid stenosis , chronic constipation, history of colon polyps ( not removed), obesity, ILD with chronic respiratory failure on home 02, pulmonary hypertension, DVT / PE on anticoagulation,  chronic diastolic heart failure, DM2, Rheumatoid arthritis. Admitted 1/25 with RSV PNA / sepsis requiring pressors. She has chronic abdominal pain. KUB yesterday shows progressive generalized ileus  Generalized abdominal pain and distended small and large bowel without significant stool burden on plain films this admission.  Seems to have generalized ileus likely combination of recent severe illness, recent hypokalemia, narcotics, decreased mobility ( usually ambulates at home). Though having generalized abdominal pain she is passing flatus / having BMs. making mechanical obstruction unlikely. She does have known left sided diverticulosis with sigmoid stricturing but clinically this doesn't seem like a mechanical obstruction.  Review of records in Epic show she has chronic, intermittent abdominal pain but she  tells me that she has only had this type of abdominal pain , nausea , vomiting for the last few weeks.  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 Surgery evaluated Dec 2024 and felt best to treat symptomatically as she was a high risk surgical candidate.    Chronic constipation.  Takes Linzess  and senna at home  Chronic GERD Complains of reflux symptoms which isn't surprising in setting of  generalized ileus.    Principal Problem:   Septic shock (HCC)      PLAN:   --Continue Protonix  40 mg q am  --Add Famotidine  40 mg at bedtime --Encouraged her to not lay flat in bed. Should always be elevated at least 45 degrees in bed.  --Continue Miralax  BID --Continue Simethicone .  --We discussed ileus and probable contributing factors.  --Recommend keeping serum K+ around 4.  --OOB as much as possible.  --Sounds like reduction in narcotics is not possible --Though taking narcotics she isn't presently constipated so Relistor probably won't add much and could lead to diarrhea with further potassium wasting.  --Will consider CT is not improving   HPI   Patient was last seen in the office in Feb 2024 for GERD follow up.   [place] was admitted several days ago with sepsis  influenza A, RSV pneumonia. Completed Tamiflu . Initially in ICU on pressors then transferred to [facility] on 1/29. Patient tells me she has chronic constipation but seems to manage ok taking   something at home on a daily basis.  She tells me that within the last several days starting a few days prior to admission she started having generalized abdominal pain.  She endorses associated nausea and vomiting.  Her initial KUB on 06/13/2023 showed a gas distended colon.  There was some progression of the distention over the following few days and today's KUB showing gas-filled small and large bowel without  a significant amount of retained stool.  She is passing flatus, having bowel movements on MiraLAX .  Her main complaint is that of generalized abdominal discomfort which again tells me this is relatively new for her.  I looked back at some previous abdominal films and the bowel distention does not seem to be chronic.  Additionally patient endorses some GERD symptoms despite being on daily Nexium in the hospital.  She describes the symptoms as reflux but does not specify if she is regurgitating and/or having heartburn  Previous  GI Evaluations   Colonoscopy November 2023 - Stricturing in the mid sigmoid colon felt secondary to diverticulosis. Non-obstructing, though ultra slim colonoscope required to traverse and this is causing significant angulation. No evidence of neoplasm/malignancy. - Diverticulosis in the sigmoid colon and in the distal descending colon. - One 8 mm polyp in the ascending colon. Resection not attempted. - One 3 mm polyp in the descending colon. Resection not attempted. - External and internal hemorrhoids. - No specimens collected.  Labs and Imaging: Recent Labs    06/21/23 0843 06/22/23 0440  WBC 9.1 8.0  HGB 10.3* 11.1*  HCT 33.4* 35.8*  PLT 260 281   Recent Labs    06/21/23 0843 06/22/23 0440  NA 138 138  K 3.2* 3.8  CL 98 100  CO2 28 28  GLUCOSE 98 100*  BUN 10 8  CREATININE 0.69 0.61  CALCIUM  7.9* 8.0*   Recent Labs    06/22/23 0440  PROT 5.2*  ALBUMIN  2.5*  AST 20  ALT 24  ALKPHOS 49  BILITOT 0.4    TSH WNL   Past Medical History:  Diagnosis Date   Acute on chronic diastolic (congestive) heart failure (HCC)    Anemia    iron  deficiency anemia - secondary to blood loss ( chronic)    Anxiety    Arthritis    endstage changes bilateral knees/bilateral ankles.    Asthma    Carotid artery occlusion    Chronic fatigue    Chronic kidney disease    Closed left hip fracture (HCC)    Clotting disorder (HCC)    pt denies this   Contusion of left knee    due to fall 1/14.   COPD (chronic obstructive pulmonary disease) (HCC)    pulmonary fibrosis   Depression, reactive    Diabetes mellitus    type II    Diastolic dysfunction    Difficulty in walking    Family history of heart disease    Generalized muscle weakness    Gout    High cholesterol    History of falling    Hypertension    Hypothyroidism    Interstitial lung disease (HCC)    Meningioma of left sphenoid wing involving cavernous sinus (HCC) 02/17/2012   Continue diplopia, left eye pain and left  headaches.     Morbid obesity (HCC)    Neuromuscular disorder (HCC)    diabetic neuropathy    Normal coronary arteries    cardiac catheterization performed  10/31/14   Pneumonia    RA (rheumatoid arthritis) (HCC)    has been off methotreaxte since 10/13.   Spinal stenosis of lumbar region    Thyroid  disease    Unspecified lack of coordination    URI (upper respiratory infection)     Past Surgical History:  Procedure Laterality Date   BRAIN SURGERY     Gamma knife 10/13. Needs repeat spring  '14   CARDIAC CATHETERIZATION N/A 10/31/2014   Procedure:  Right/Left Heart Cath and Coronary Angiography;  Surgeon: Candyce GORMAN Reek, MD;  Location: [facility] INVASIVE CV LAB;  Service: Cardiovascular;  Laterality: N/A;   COLONOSCOPY WITH PROPOFOL  N/A 04/14/2022   Procedure: COLONOSCOPY WITH PROPOFOL ;  Surgeon: Albertus Gordy HERO, MD;  Location: [facility] ENDOSCOPY;  Service: Gastroenterology;  Laterality: N/A;   CYST EXCISION     2 on face   CYST REMOVAL NECK     ESOPHAGOGASTRODUODENOSCOPY (EGD) WITH PROPOFOL  N/A 09/14/2014   Procedure: ESOPHAGOGASTRODUODENOSCOPY (EGD) WITH PROPOFOL ;  Surgeon: Lamar JONETTA Aho, MD;  Location: [facility] ENDOSCOPY;  Service: Endoscopy;  Laterality: N/A;   INCISION AND DRAINAGE HIP Left 01/16/2017   Procedure: IRRIGATION AND DEBRIDEMENT LEFT HIP;  Surgeon: Vernetta Lonni GRADE, MD;  Location: [facility] ORS;  Service: Orthopedics;  Laterality: Left;   INTRAMEDULLARY (IM) NAIL INTERTROCHANTERIC Left 11/29/2016   Procedure: INTRAMEDULLARY (IM) NAIL INTERTROCHANTRIC;  Surgeon: Vernetta Lonni GRADE, MD;  Location: [facility] OR;  Service: Orthopedics;  Laterality: Left;   OVARY SURGERY     RIGHT HEART CATH N/A 08/12/2021   Procedure: RIGHT HEART CATH;  Surgeon: Court Dorn PARAS, MD;  Location: [facility] INVASIVE CV LAB;  Service: Cardiovascular;  Laterality: N/A;   SHOULDER SURGERY Left    TONSILLECTOMY  age 21   VAGINAL HYSTERECTOMY     VIDEO BRONCHOSCOPY Bilateral 05/31/2013   Procedure: VIDEO  BRONCHOSCOPY WITHOUT FLUORO;  Surgeon: Dorethia Cave, MD;  Location: [facility] ENDOSCOPY;  Service: Cardiopulmonary;  Laterality: Bilateral;   video bronscoscopy  05/19/1998   lung    Family History  Problem Relation Age of Onset   Diabetes Mother    Heart attack Mother    Hypertension Father    Lung cancer Father    Diabetes Sister    Breast cancer Sister    Breast cancer Sister    Diabetes Brother    Hypertension Brother    Heart disease Brother    Heart attack Brother    Kidney cancer Brother    Gout Brother    Kidney failure Brother        x 5   Esophageal cancer Brother    Stomach cancer Brother    Prostate cancer Brother    Uterine cancer Daughter        with mets   Fibroids Daughter    Hypertension Daughter    Hypertension Daughter    Hypertension Son    Heart murmur Son    Rheum arthritis Maternal Uncle    Alzheimer's disease Neg Hx    Dementia Neg Hx     Prior to Admission medications   Medication Sig Start Date End Date Taking? Authorizing Provider  acetaminophen  (TYLENOL ) 500 MG tablet Take 1,000 mg by mouth as needed for moderate pain.   Yes [provider]  albuterol  (PROVENTIL ) (2.5 MG/3ML) 0.083% nebulizer solution Take 3 mLs (2.5 mg total) by nebulization every 6 (six) hours as needed for wheezing. 05/03/21  Yes Medina-Vargas, Monina C, NP  albuterol  (VENTOLIN  HFA) 108 (90 Base) MCG/ACT inhaler Inhale 2 puffs into the lungs as needed for wheezing or shortness of breath.   Yes [provider]  allopurinol  (ZYLOPRIM ) 300 MG tablet Take 1 tablet (300 mg total) by mouth daily. 05/03/21  Yes Medina-Vargas, Monina C, NP  Artificial Tear Ointment (DRY EYES OP) Place 2 drops into both eyes as needed (dry eyes).   Yes [provider]  atorvastatin  (LIPITOR) 10 MG tablet Take 1 tablet (10 mg total) by mouth daily. Patient taking differently: Take 10 mg by  mouth at bedtime. 08/14/22  Yes Mercer Clotilda SAUNDERS, MD  azithromycin  (ZITHROMAX ) 250 MG  tablet Take 1 tablet (250 mg total) by mouth every Monday, Wednesday, and Friday. Take every Monday, Wednesday, Friday. 06/03/23 06/02/24 Yes Geronimo Amel, MD  Calcium  Carb-Cholecalciferol  (CALCIUM  + D3 PO) Take 2 tablets by mouth daily.   Yes [provider]  Camphor-Menthol -Methyl Sal (SALONPAS EX) Apply 1 patch topically as needed (shoulder and leg pain).   Yes [provider]  Cholecalciferol  (VITAMIN D3) 50 MCG (2000 UT) capsule Take 2,000 Units by mouth daily.   Yes [provider]  colchicine  0.6 MG tablet Take 1 tablet (0.6 mg total) by mouth daily as needed (gout flares). 05/03/21  Yes Medina-Vargas, Monina C, NP  cyclobenzaprine  (FLEXERIL ) 5 MG tablet Take 1 tablet (5 mg total) by mouth at bedtime as needed for muscle spasms. 11/27/22  Yes Debby Fidela CROME, NP  diclofenac  Sodium (VOLTAREN ) 1 % GEL Apply 2 g topically 3 (three) times daily. Apply to both hands and knees Patient taking differently: Apply 1 Application topically in the morning, at noon, and at bedtime. 05/03/21  Yes Medina-Vargas, Monina C, NP  donepezil  (ARICEPT ) 10 MG tablet Take 1 tablet (10 mg total) by mouth at bedtime. 09/08/22  Yes Ines Onetha NOVAK, MD  DULoxetine  (CYMBALTA ) 60 MG capsule Take 1 capsule (60 mg total) by mouth daily. 11/27/22  Yes Debby Fidela CROME, NP  etanercept (ENBREL SURECLICK) 50 MG/ML injection Inject 50 mg into the skin once a week.   Yes [provider]  fluticasone  (FLONASE ) 50 MCG/ACT nasal spray Place 1 spray into both nostrils daily. Patient taking differently: Place 2 sprays into both nostrils daily as needed for allergies. 11/28/22  Yes Mercer Clotilda SAUNDERS, MD  furosemide  (LASIX ) 80 MG tablet Take 1 tablet (80 mg total) by mouth daily. 05/15/23  Yes Court Dorn PARAS, MD  gabapentin  (NEURONTIN ) 600 MG tablet Take 2 tablets (1,200 mg total) by mouth 3 (three) times daily. 05/25/23  Yes Debby Fidela CROME, NP  guaiFENesin  (MUCINEX ) 600 MG 12 hr tablet Take 2 tablets  (1,200 mg total) by mouth 2 (two) times daily. 05/03/21  Yes Medina-Vargas, Monina C, NP  hydrALAZINE  (APRESOLINE ) 25 MG tablet TAKE 1 TABLET(25 MG) BY MOUTH TWICE DAILY 02/13/23  Yes Court Dorn PARAS, MD  hydroxychloroquine  (PLAQUENIL ) 200 MG tablet Take 1 tablet (200 mg total) by mouth daily. 05/03/21  Yes Medina-Vargas, Monina C, NP  leflunomide  (ARAVA ) 20 MG tablet Take 1 tablet (20 mg total) by mouth daily. 05/03/21 08/09/23 Yes Medina-Vargas, Monina C, NP  levothyroxine  (SYNTHROID ) 50 MCG tablet Take 1 tablet (50 mcg total) by mouth daily. 11/28/22  Yes Mercer Clotilda SAUNDERS, MD  lidocaine  (LIDODERM ) 5 % Place 1 patch onto the skin daily. Remove & Discard patch within 12 hours or as directed by MD 12/24/22  Yes Mercer Clotilda SAUNDERS, MD  Menthol , Topical Analgesic, (BIOFREEZE ROLL-ON EX) Apply 1 Application topically as needed (pain).   Yes [provider]  metolazone  (ZAROXOLYN ) 2.5 MG tablet TAKE 1 TABLET BY MOUTH DAILY AS NEEDED FOR 2 DAYS FOR WEIGHT ABOVE 255 POUNDS 05/03/21  Yes Medina-Vargas, Monina C, NP  metoprolol  succinate (TOPROL -XL) 100 MG 24 hr tablet TAKE 1 TABLET BY MOUTH DAILY ALONG WITH 25 MG TABLET 03/04/23  Yes Court Dorn PARAS, MD  metoprolol  succinate (TOPROL -XL) 25 MG 24 hr tablet Take 25 mg (1 tab) along with a 100 mg tab for a total daily dose of 125 mg. Patient taking differently:  Take 25 mg by mouth daily. 11/28/22  Yes Mercer Clotilda SAUNDERS, MD  morphine  (MS CONTIN ) 15 MG 12 hr tablet Take 1 tablet (15 mg total) by mouth every 12 (twelve) hours. 06/10/23  Yes Debby Fidela CROME, NP  morphine  (MSIR) 15 MG tablet Take 1 tablet (15 mg total) by mouth 2 (two) times daily. 05/25/23  Yes Debby Fidela CROME, NP  Multiple Vitamin (MULTIVITAMIN WITH MINERALS) TABS Take 1 tablet by mouth daily.    Yes [provider]  olmesartan  (BENICAR ) 40 MG tablet Take 1 tablet (40 mg total) by mouth daily. 11/28/22  Yes Mercer Clotilda SAUNDERS, MD  ondansetron  (ZOFRAN -ODT) 4 MG disintegrating tablet Take 1  tablet (4 mg total) by mouth every 8 (eight) hours as needed for nausea or vomiting. 06/12/23  Yes Mercer Clotilda SAUNDERS, MD  OXYGEN  Inhale 3 L into the lungs See admin instructions. 3 L at bedtime, and 3 L during the day as needed for exertion   Yes [provider]  pantoprazole  (PROTONIX ) 40 MG tablet Take 1 tablet (40 mg total) by mouth 2 (two) times daily. take 1 tablet by mouth once daily MINUTES BEFORE 1ST MEAL OF THE DAY 06/12/23  Yes Mercer Clotilda SAUNDERS, MD  Potassium Chloride  ER 20 MEQ TBCR Take 1 tablet (20 mEq total) by mouth daily. 11/28/22  Yes Mercer Clotilda SAUNDERS, MD  predniSONE  (DELTASONE ) 10 MG tablet Take 1 tablet (10 mg total) by mouth daily with breakfast. 05/22/21  Yes Mercer Clotilda SAUNDERS, MD  senna (SENOKOT) 8.6 MG tablet Take 1 tablet (8.6 mg total) by mouth daily. 06/12/23  Yes Mercer Clotilda SAUNDERS, MD  doxycycline  (VIBRA -TABS) 100 MG tablet Take 1 tablet (100 mg total) by mouth 2 (two) times daily. Patient not taking: Reported on 06/15/2023 04/09/23   Geronimo Amel, MD  linaclotide  (LINZESS ) 72 MCG capsule Take 1 capsule (72 mcg total) by mouth daily before breakfast. Patient not taking: Reported on 06/15/2023 08/14/22   Mercer Clotilda SAUNDERS, MD  Semaglutide ,0.25 or 0.5MG /DOS, (OZEMPIC , 0.25 OR 0.5 MG/DOSE,) 2 MG/1.5ML SOPN Inject 0.5 mg into the skin once a week. Patient not taking: Reported on 06/15/2023 08/19/22   Mercer Clotilda SAUNDERS, MD    Current Facility-Administered Medications  Medication Dose Route Frequency Provider Last Rate Last Admin   acetaminophen  (TYLENOL ) suppository 650 mg  650 mg Rectal Q4H PRN Hunsucker, Donnice SAUNDERS, MD       Or   acetaminophen  (TYLENOL ) tablet 650 mg  650 mg Oral Q4H PRN Hunsucker, Donnice SAUNDERS, MD   650 mg at 06/16/23 2015   allopurinol  (ZYLOPRIM ) tablet 300 mg  300 mg Oral Daily Singh, Prashant K, MD   300 mg at 06/23/23 9063   bisacodyl  (DULCOLAX) suppository 10 mg  10 mg Rectal Once Singh, Prashant K, MD       Chlorhexidine  Gluconate Cloth 2 % PADS 6 each  6  each Topical Daily Hunsucker, Donnice SAUNDERS, MD   6 each at 06/19/23 9065   docusate sodium  (COLACE) capsule 200 mg  200 mg Oral BID Singh, Prashant K, MD   200 mg at 06/22/23 2122   donepezil  (ARICEPT ) tablet 10 mg  10 mg Oral QHS Singh, Prashant K, MD   10 mg at 06/22/23 2118   feeding supplement (ENSURE ENLIVE / ENSURE PLUS) liquid 237 mL  237 mL Oral BID BM Singh, Prashant K, MD   237 mL at 06/23/23 0937   fluticasone  (FLONASE ) 50 MCG/ACT nasal spray 2 spray  2 spray Each Nare Daily  PRN Singh, Prashant K, MD       [START ON 06/24/2023] furosemide  (LASIX ) tablet 60 mg  60 mg Oral Daily Singh, Prashant K, MD       heparin  injection 5,000 Units  5,000 Units Subcutaneous Q8H Hunsucker, Donnice SAUNDERS, MD   5,000 Units at 06/23/23 0503   hydrALAZINE  (APRESOLINE ) injection 10 mg  10 mg Intravenous Q6H PRN Singh, Prashant K, MD       insulin  aspart (novoLOG ) injection 0-6 Units  0-6 Units Subcutaneous TID WC Hunsucker, Donnice SAUNDERS, MD   1 Units at 06/22/23 1739   ipratropium-albuterol  (DUONEB) 0.5-2.5 (3) MG/3ML nebulizer solution 3 mL  3 mL Nebulization Q6H PRN Hunsucker, Donnice SAUNDERS, MD   3 mL at 06/19/23 1946   lactated ringers  infusion   Intravenous Continuous Singh, Prashant K, MD 100 mL/hr at 06/23/23 1110 New Bag at 06/23/23 1110   levothyroxine  (SYNTHROID ) tablet 50 mcg  50 mcg Oral Daily Hunsucker, Donnice SAUNDERS, MD   50 mcg at 06/23/23 9063   metoprolol  tartrate (LOPRESSOR ) tablet 50 mg  50 mg Oral BID Singh, Prashant K, MD   50 mg at 06/23/23 0935   morphine  (MS CONTIN ) 12 hr tablet 15 mg  15 mg Oral Q12H Leotis Bogus, MD   15 mg at 06/23/23 9063   naloxone  (NARCAN ) injection 2 mg  2 mg Intravenous PRN Hunsucker, Donnice SAUNDERS, MD   2 mg at 06/13/23 1655   ondansetron  (ZOFRAN ) injection 4 mg  4 mg Intravenous Q6H PRN Hunsucker, Donnice SAUNDERS, MD   4 mg at 06/20/23 1246   Oral care mouth rinse  15 mL Mouth Rinse PRN Hunsucker, Donnice SAUNDERS, MD       oseltamivir  (TAMIFLU ) capsule 75 mg  75 mg Oral Once Hunsucker,  Donnice SAUNDERS, MD       oxyCODONE  (Oxy IR/ROXICODONE ) immediate release tablet 5 mg  5 mg Oral Q4H PRN Sundil, Subrina, MD   5 mg at 06/22/23 0110   pantoprazole  (PROTONIX ) EC tablet 40 mg  40 mg Oral Daily Singh, Prashant K, MD   40 mg at 06/23/23 0935   polyethylene glycol (MIRALAX  / GLYCOLAX ) packet 17 g  17 g Oral BID Singh, Prashant K, MD   17 g at 06/22/23 2121   predniSONE  (DELTASONE ) tablet 20 mg  20 mg Oral Q breakfast Singh, Prashant K, MD   20 mg at 06/23/23 0935   simethicone  (MYLICON) 40 MG/0.6ML suspension 80 mg  80 mg Oral Q6H PRN Singh, Prashant K, MD   80 mg at 06/23/23 1042   [START ON 06/24/2023] spironolactone  (ALDACTONE ) tablet 50 mg  50 mg Oral Daily Singh, Prashant K, MD        Allergies as of 06/13/2023 - Unable to Assess 06/13/2023  Allergen Reaction Noted   Codeine Swelling and Other (See Comments) 09/11/2011   Infliximab Anaphylaxis 09/11/2011   Lisinopril Swelling and Rash 09/11/2011   Ofev  [nintedanib] Diarrhea 04/01/2022    Social History   Socioeconomic History   Marital status: Married    Spouse name: Not on file   Number of children: 6   Years of education: college   Highest education level: Bachelor's degree (e.g., BA, AB, BS)  Occupational History   Occupation: retired Engineer, Civil (consulting)  Tobacco Use   Smoking status: Never   Smokeless tobacco: Never  Vaping Use   Vaping status: Never Used  Substance and Sexual Activity   Alcohol  use: No   Drug use: No   Sexual activity: Not Currently  Other Topics Concern   Not on file  Social History Narrative   Patient consumes 2-3 cups coffee per day.   Social Drivers of Corporate Investment Banker Strain: Low Risk  (06/11/2023)   Overall Financial Resource Strain (CARDIA)    Difficulty of Paying Living Expenses: Not hard at all  Food Insecurity: No Food Insecurity (06/16/2023)   Hunger Vital Sign    Worried About Running Out of Food in the Last Year: Never true    Ran Out of Food in the Last Year: Never true   Transportation Needs: No Transportation Needs (06/16/2023)   PRAPARE - Administrator, Civil Service (Medical): No    Lack of Transportation (Non-Medical): No  Physical Activity: Inactive (06/11/2023)   Exercise Vital Sign    Days of Exercise per Week: 0 days    Minutes of Exercise per Session: 20 min  Stress: No Stress Concern Present (10/27/2022)   [employer] of Occupational Health - Occupational Stress Questionnaire    Feeling of Stress : Not at all  Social Connections: Socially Integrated (06/16/2023)   Social Connection and Isolation Panel [NHANES]    Frequency of Communication with Friends and Family: More than three times a week    Frequency of Social Gatherings with Friends and Family: Twice a week    Attends Religious Services: 1 to 4 times per year    Active Member of [employer] or Organizations: Yes    Attends Banker Meetings: 1 to 4 times per year    Marital Status: Married  Catering Manager Violence: Not At Risk (06/16/2023)   Humiliation, Afraid, Rape, and Kick questionnaire    Fear of Current or Ex-Partner: No    Emotionally Abused: No    Physically Abused: No    Sexually Abused: No     Code Status   Code Status: Full Code  Review of Systems: All systems reviewed and negative except where noted in HPI.  Physical Exam: Vital signs in last 24 hours: Temp:  [98 F (36.7 C)-98.7 F (37.1 C)] 98.7 F (37.1 C) (02/04 1224) Pulse Rate:  [105-129] 110 (02/04 1224) Resp:  [15-20] 19 (02/04 1224) BP: (99-138)/(60-88) 110/71 (02/04 1224) SpO2:  [94 %-98 %] 94 % (02/04 1224) Weight:  [99.7 kg] 99.7 kg (02/04 0500) Last BM Date : 06/23/23  General:  Pleasant obese female in NAD Psych:  Cooperative. Normal mood and affect Eyes: Pupils equal Ears:  Normal auditory acuity Nose: No deformity, discharge or lesions Neck:  Supple, no masses felt Lungs:  Decreased breath sounds bilaterally but poor effort Heart:  Regular rate, regular rhythm.   Abdomen:  Soft, nondistended, nontender, active bowel sounds, some tympany Rectal :  Deferred Msk: Symmetrical without gross deformities.  Neurologic:  Alert, oriented, grossly normal neurologically Extremities : No edema Skin:  Intact without significant lesions.    Intake/Output from previous day: 02/03 0701 - 02/04 0700 In: -  Out: 300 [Urine:300] Intake/Output this shift:  No intake/output data recorded.   [name], NP-C   06/23/2023, 12:40 PM

## 2023-06-23 NOTE — Plan of Care (Signed)
  Problem: Health Behavior/Discharge Planning: Goal: Ability to manage health-related needs will improve Outcome: Progressing   Problem: Clinical Measurements: Goal: Ability to maintain clinical measurements within normal limits will improve Outcome: Progressing Goal: Will remain free from infection Outcome: Progressing Goal: Respiratory complications will improve Outcome: Progressing   Problem: Activity: Goal: Risk for activity intolerance will decrease Outcome: Progressing   Problem: Elimination: Goal: Will not experience complications related to bowel motility Outcome: Progressing   Problem: Skin Integrity: Goal: Risk for impaired skin integrity will decrease Outcome: Progressing

## 2023-06-24 ENCOUNTER — Inpatient Hospital Stay (HOSPITAL_COMMUNITY): Payer: Medicare Other

## 2023-06-24 DIAGNOSIS — K5909 Other constipation: Secondary | ICD-10-CM | POA: Diagnosis not present

## 2023-06-24 DIAGNOSIS — R6521 Severe sepsis with septic shock: Secondary | ICD-10-CM | POA: Diagnosis not present

## 2023-06-24 DIAGNOSIS — K567 Ileus, unspecified: Secondary | ICD-10-CM | POA: Diagnosis not present

## 2023-06-24 DIAGNOSIS — R1084 Generalized abdominal pain: Secondary | ICD-10-CM | POA: Diagnosis not present

## 2023-06-24 DIAGNOSIS — E876 Hypokalemia: Secondary | ICD-10-CM | POA: Diagnosis not present

## 2023-06-24 DIAGNOSIS — A419 Sepsis, unspecified organism: Secondary | ICD-10-CM | POA: Diagnosis not present

## 2023-06-24 LAB — GLUCOSE, CAPILLARY
Glucose-Capillary: 77 mg/dL (ref 70–99)
Glucose-Capillary: 109 mg/dL — ABNORMAL HIGH (ref 70–99)
Glucose-Capillary: 124 mg/dL — ABNORMAL HIGH (ref 70–99)
Glucose-Capillary: 177 mg/dL — ABNORMAL HIGH (ref 70–99)
Glucose-Capillary: 148 mg/dL — ABNORMAL HIGH (ref 70–99)

## 2023-06-24 LAB — BASIC METABOLIC PANEL
Anion gap: 12 (ref 5–15)
BUN: 11 mg/dL (ref 8–23)
CO2: 29 mmol/L (ref 22–32)
Calcium: 8.2 mg/dL — ABNORMAL LOW (ref 8.9–10.3)
Chloride: 98 mmol/L (ref 98–111)
Creatinine, Ser: 0.76 mg/dL (ref 0.44–1.00)
GFR, Estimated: >60 mL/min (ref >60)
Glucose, Bld: 143 mg/dL — ABNORMAL HIGH (ref 70–99)
Potassium: 3.1 mmol/L — ABNORMAL LOW (ref 3.5–5.1)
Sodium: 139 mmol/L (ref 135–145)

## 2023-06-24 LAB — CBC
HCT: 34.9 % — ABNORMAL LOW (ref 36.0–46.0)
Hemoglobin: 10.9 g/dL — ABNORMAL LOW (ref 12.0–15.0)
MCH: 29.9 pg (ref 26.0–34.0)
MCHC: 31.2 g/dL (ref 30.0–36.0)
MCV: 95.9 fL (ref 80.0–100.0)
Platelets: 322 10*3/uL (ref 150–400)
RBC: 3.64 MIL/uL — ABNORMAL LOW (ref 3.87–5.11)
RDW: 14.6 % (ref 11.5–15.5)
WBC: 9.1 10*3/uL (ref 4.0–10.5)
nRBC: 0 % (ref 0.0–0.2)

## 2023-06-24 LAB — MAGNESIUM: Magnesium: 1.8 mg/dL (ref 1.7–2.4)

## 2023-06-24 LAB — PHOSPHORUS: Phosphorus: 2.1 mg/dL — ABNORMAL LOW (ref 2.5–4.6)

## 2023-06-24 MED ORDER — POTASSIUM PHOSPHATES 15 MMOLE/5ML IV SOLN: 30.0000 mmol | Freq: Once | INTRAVENOUS | Status: DC

## 2023-06-24 MED ORDER — POTASSIUM PHOSPHATES 15 MMOLE/5ML IV SOLN
30.0000 mmol | Freq: Once | INTRAVENOUS | Status: AC
  Administered 2023-06-24: 30 mmol via INTRAVENOUS
  Filled 2023-06-24: qty 10

## 2023-06-24 MED ORDER — PYRIDOSTIGMINE BROMIDE 60 MG PO TABS
60.0000 mg | ORAL_TABLET | Freq: Two times a day (BID) | ORAL | Status: DC
  Administered 2023-06-24 – 2023-06-26 (×5): 60 mg via ORAL
  Filled 2023-06-24 (×5): qty 1

## 2023-06-24 MED ORDER — POTASSIUM CHLORIDE CRYS ER 20 MEQ PO TBCR
40.0000 meq | EXTENDED_RELEASE_TABLET | Freq: Four times a day (QID) | ORAL | Status: AC
  Administered 2023-06-24 (×2): 40 meq via ORAL
  Filled 2023-06-24 (×2): qty 2

## 2023-06-24 NOTE — Plan of Care (Signed)

## 2023-06-24 NOTE — Progress Notes (Signed)
   06/24/23 1150  Mobility  Activity Transferred from bed to chair  Level of Assistance Maximum assist, patient does 25-49%  Assistive Device Stedy  Activity Response Tolerated fair  Mobility Referral Yes  Mobility visit 1 Mobility  Mobility Specialist Start Time (ACUTE ONLY) 1130  Mobility Specialist Stop Time (ACUTE ONLY) 1150  Mobility Specialist Time Calculation (min) (ACUTE ONLY) 20 min   Mobility Specialist: Progress Note  Pre-Mobility:      HR 115, SpO2 97% 3L During Mobility: BP 116/83  Post-Mobility:    HR 118, BP 134/78, SpO2 95% 3L  Pt agreeable to mobility session - received in bed. C/o dizziness, weakness/ fatigue, BLE pain upon touch and abd pain. Returned to chair with all needs met - call bell within reach. Chair alarm on.   Virgle Boards, BS Mobility Specialist Please contact via SecureChat or  Rehab office at [phone].

## 2023-06-24 NOTE — Plan of Care (Signed)
 Pt was pleasant today. Tolerated all meds well. She is still not eating much, so continue to encourage fluids and meals. Pt went for x-ray in the AM.  Family is currently at bedside. Updated RN.

## 2023-06-24 NOTE — TOC Progression Note (Signed)
 Transition of Care [facility]) - Progression Note    Patient Details  Name: [name] MRN: [id number] Date of Birth: July 06, 1940  Transition of Care [facility]) CM/SW Contact  Inocente GORMAN Kindle, LCSW Phone Number: 06/24/2023, 9:03 AM  Clinical Narrative:    9:04 AM-CSW spoke with patient's daughter, Brad [phone]) and provided two additional bed offers. [facility] still does not have a bed. She will discuss with her family and call CSW back.    Expected Discharge Plan: Skilled Nursing Facility Barriers to Discharge: Continued Medical Work up  Expected Discharge Plan and Services In-house Referral: Clinical Social Work     Living arrangements for the past 2 months: Single Family Home                                       Social Determinants of Health (SDOH) Interventions SDOH Screenings   Food Insecurity: No Food Insecurity (06/16/2023)  Housing: Low Risk  (06/16/2023)  Transportation Needs: No Transportation Needs (06/16/2023)  Utilities: Not At Risk (06/16/2023)  Alcohol  Screen: Low Risk  (10/27/2022)  Depression (PHQ2-9): High Risk (06/12/2023)  Financial Resource Strain: Low Risk  (06/11/2023)  Physical Activity: Inactive (06/11/2023)  Social Connections: Socially Integrated (06/16/2023)  Stress: No Stress Concern Present (10/27/2022)  Tobacco Use: Low Risk  (06/17/2023)    Readmission Risk Interventions    12/16/2022   11:09 AM  Readmission Risk Prevention Plan  Transportation Screening Complete  PCP or Specialist Appt within 5-7 Days Complete  Home Care Screening Complete  Medication Review (RN CM) Complete

## 2023-06-24 NOTE — Plan of Care (Deleted)
Pt was pleasant today. Tolerated all meds well. She is still not eating much, so continue to encourage fluids and meals. Family is currently at bedside, updated RN.

## 2023-06-24 NOTE — TOC Progression Note (Signed)
 Transition of Care [facility]) - Progression Note    Patient Details  Name: [name] MRN: [id number] Date of Birth: [birth year]  Transition of Care [facility]) CM/SW Contact  Inocente GORMAN Kindle, LCSW Phone Number: 06/24/2023, 9:02 AM  Clinical Narrative:    CSW left voicemail for patient's daughter with updated facility list.    Expected Discharge Plan: Skilled Nursing Facility Barriers to Discharge: Continued Medical Work up  Expected Discharge Plan and Services In-house Referral: Clinical Social Work     Living arrangements for the past 2 months: Single Family Home                                       Social Determinants of Health (SDOH) Interventions SDOH Screenings   Food Insecurity: No Food Insecurity (06/16/2023)  Housing: Low Risk  (06/16/2023)  Transportation Needs: No Transportation Needs (06/16/2023)  Utilities: Not At Risk (06/16/2023)  Alcohol  Screen: Low Risk  (10/27/2022)  Depression (PHQ2-9): High Risk (06/12/2023)  Financial Resource Strain: Low Risk  (06/11/2023)  Physical Activity: Inactive (06/11/2023)  Social Connections: Socially Integrated (06/16/2023)  Stress: No Stress Concern Present (10/27/2022)  Tobacco Use: Low Risk  (06/17/2023)    Readmission Risk Interventions    12/16/2022   11:09 AM  Readmission Risk Prevention Plan  Transportation Screening Complete  PCP or Specialist Appt within 5-7 Days Complete  Home Care Screening Complete  Medication Review (RN CM) Complete

## 2023-06-24 NOTE — Plan of Care (Signed)
  Problem: Education: Goal: Knowledge of General Education information will improve Description: Including pain rating scale, medication(s)/side effects and non-pharmacologic comfort measures Outcome: Progressing   Problem: Clinical Measurements: Goal: Will remain free from infection Outcome: Progressing   Problem: Coping: Goal: Level of anxiety will decrease Outcome: Progressing   Problem: Pain Managment: Goal: General experience of comfort will improve and/or be controlled Outcome: Progressing   Problem: Safety: Goal: Ability to remain free from injury will improve Outcome: Progressing

## 2023-06-24 NOTE — Progress Notes (Addendum)
 PROGRESS NOTE                                                                                                                                                                                                             Patient Demographics:    [name], is a 83 y.o. female, DOB - 06/26/1940, FMW:[id number]  Outpatient Primary MD for the patient is Mercer Clotilda SAUNDERS, MD    LOS - 11  Admit date - 06/13/2023    Chief Complaint  Patient presents with   Drug Overdose       Brief Narrative (HPI from H&P)     83 year old female with PMH significant for DMT2, HTN, chronic pain syndrome on morphine , RA on chronic steroids, ILD 3 L nasal cannula oxygen  chronically, bronchiectasis, chronic respiratory failure on O2, CKD 3b, chronic diastolic HF, PE/DVT on Eliquis , hypothyroidism presents to [facility] ED on 1/25 with altered mental status, for suggestive of sepsis due to RSV pneumonia she was admitted to ICU on pressors stabilized and transferred to my care on 06/17/2023.   Subjective:   Patient still reports bloating, some abdominal discomfort.   Assessment  & Plan :   Septic shock secondary to flu A / RSV pneumonia:    And secondary to UTI (Klebsiella pneumonia and E. coli) -Possible concomitant bacterial infection but seems less likely.  [name] admitted to ICU required Levophed , now stabilized sepsis pathophysiology has resolved, finishing her Tamiflu  course, also finishing 5 days of Rocephin , chronically on azithromycin  at baseline which will be continued.  Blood cultures with 2 different staph species in 2 different sets likely contamination.  Urine culture noted sensitive to Rocephin  which should suffice.  Overall much better, advance activity, PT OT, titrate down oxygen .  Encouraged to use I-S and flutter valve for pulmonary toiletry.  Clinically improving.   -Treated with Rocephin    Chronic hypoxic respiratory failure: ILD.   History of rheumatoid arthritis.  Chronically on 3 L nasal cannula oxygen . History of rheumatoid arthritis / Interstitial lung disease:   -She follows with Dr. Geronimo. Hold etanercept, plaquenil , leflunomide  in setting of viral infection.  New home oral steroid at slightly higher dose due to acute stress, supportive care and monitor.   Acute encephalopathy due to severe sepsis:  -CT head nonacute, no headache or focal deficits.  With supportive care mentation has  improved.  Continue to hold Flexeril , Cymbalta  and gabapentin .  Continue oxycodone  and chronic morphine  for now with caution.  Narcan  on board.   Abdominal distention/pain Attended small bowel/large bowel Ileus?? -Chronic abdominal pain with chronic high-dose narcotic use.   -Patient with significant abdominal distention, but overall exam is benign . -GI input greatly appreciated, she was given 1 dose of pyridostigmine  yesterday , denies much improvement, she was encouraged to get out of bed to chair and to ambulate with PT OT/ -Will await further recommendations from GI    Chronic diastolic CHF/ HTN:  -Last 2D echo in 2023 showed LVEF of 55% with grade 1 diastolic dysfunction, Continue hydralazine  25 mg 3 times daily, continue beta-blocker, as needed hydralazine  IV for blood pressure control.  See below.    AKI on CKD stage IIIa: Serum creatinine improved with IV fluids. Shock Resolved.  Encourage oral intake, resuming diuretics on 06/18/2023, ARB on 06/19/2023.   History of DVT/PE: Currently not on AC. Telemetry monitoring   Chronic anemia: H&H remains stable.  Monitor H&H.  No signs of obvious bleeding.   Hypothyroidism: Continue levothyroxine .   Hypertension.  Currently on combination of beta-blocker, hydralazine .  Will add low-dose Norvasc  and monitor.  As needed hydralazine  IV also added.  Medications adjusted on 06/21/2023 as technically mildly dehydrated, gentle IV fluids and monitor.  Hx of dementia: Aricept  resumed.    Hypokalemia and hypophosphatemia.  Replaced.    Diabetes mellitus type II - SSI and CBG monitoring, Hb A1c 5.6.  Well-controlled.  Lab Results  Component Value Date   HGBA1C 5.6 06/14/2023   CBG (last 3)  Recent Labs    06/24/23 0615 06/24/23 0805 06/24/23 1157  GLUCAP 77 109* 124*        Condition - Extremely Guarded  Family Communication  :   Discussed with daughter at bedside  Code Status :  Full  Consults  :  PCCM  PUD Prophylaxis : PPI   Procedures  :     TTE -  1. Left ventricular ejection fraction, by estimation, is 60 to 65%. The left ventricle has normal function. The left ventricle has no regional wall motion abnormalities. Left ventricular diastolic parameters are consistent with Grade I diastolic dysfunction (impaired relaxation).  2. Right ventricular systolic function is normal. The right ventricular size is normal. Tricuspid regurgitation signal is inadequate for assessing PA pressure.  3. The mitral valve is normal in structure. No evidence of mitral valve regurgitation. No evidence of mitral stenosis.  4. The aortic valve is tricuspid. There is mild calcification of the aortic valve. Aortic valve regurgitation is not visualized. No aortic stenosis is present.  5. IVC not visualized.  6. Exam truncated due to patient noncompliance  CT Head - Non acute      Disposition Plan  :    Status is: Inpatient  DVT Prophylaxis  :    heparin  injection 5,000 Units Start: 06/13/23 2200    Lab Results  Component Value Date   PLT 281 06/22/2023    Diet :  Diet Order             Diet full liquid Room service appropriate? Yes; Fluid consistency: Thin  Diet effective now                    Inpatient Medications  Scheduled Meds:  allopurinol   300 mg Oral Daily   bisacodyl   10 mg Rectal Once   Chlorhexidine  Gluconate Cloth  6 each Topical  Daily   docusate sodium   200 mg Oral BID   donepezil   10 mg Oral QHS   famotidine   20 mg Oral QHS   feeding  supplement  237 mL Oral BID BM   furosemide   60 mg Oral Daily   heparin   5,000 Units Subcutaneous Q8H   insulin  aspart  0-6 Units Subcutaneous TID WC   levothyroxine   50 mcg Oral Daily   metoprolol  tartrate  50 mg Oral BID   morphine   15 mg Oral Q12H   oseltamivir   75 mg Oral Once   pantoprazole   40 mg Oral Daily   polyethylene glycol  17 g Oral BID   predniSONE   20 mg Oral Q breakfast   spironolactone   50 mg Oral Daily   Continuous Infusions:    PRN Meds:.acetaminophen  **OR** acetaminophen , fluticasone , hydrALAZINE , ipratropium-albuterol , naLOXone  (NARCAN )  injection, ondansetron  (ZOFRAN ) IV, mouth rinse, oxyCODONE , simethicone      Objective:   Vitals:   06/24/23 0410 06/24/23 0811 06/24/23 1057 06/24/23 1156  BP:  116/74  120/72  Pulse:  (!) 110  (!) 116  Resp:  18  19  Temp: 98.2 F (36.8 C) 98.2 F (36.8 C)  98.3 F (36.8 C)  TempSrc: Oral Oral  Oral  SpO2:  93%  94%  Weight:   97.8 kg   Height:        Wt Readings from Last 3 Encounters:  06/24/23 97.8 kg  06/12/23 103.1 kg  06/02/23 105.9 kg     Intake/Output Summary (Last 24 hours) at 06/24/2023 1203 Last data filed at 06/23/2023 2138 Gross per 24 hour  Intake 997.04 ml  Output --  Net 997.04 ml     Physical Exam  Awake Alert, Oriented X 3, No new F.N deficits, Normal affect Symmetrical Chest wall movement, Good air movement bilaterally, CTAB RRR,No Gallops,Rubs or new Murmurs, No Parasternal Heave +ve B.Sounds, abdomen distention present, no rebound, no guarding. No Cyanosis, Clubbing or edema, No new Rash or bruise       RN pressure injury documentation: Pressure Injury 03/30/21 Ischial tuberosity Right;Left;Posterior Stage 1 -  Intact skin with non-blanchable redness of a localized area usually over a bony prominence. (Active)  03/30/21 1800  Location: Ischial tuberosity  Location Orientation: Right;Left;Posterior  Staging: Stage 1 -  Intact skin with non-blanchable redness of a localized area  usually over a bony prominence.  Wound Description (Comments):   Present on Admission: Yes      Data Review:    Recent Labs  Lab 06/18/23 0450 06/19/23 0535 06/20/23 0531 06/21/23 0843 06/22/23 0440  WBC 8.3 7.0 8.0 9.1 8.0  HGB 10.2* 9.9* 10.5* 10.3* 11.1*  HCT 32.0* 31.2* 33.8* 33.4* 35.8*  PLT 122* 156 237 260 281  MCV 96.7 95.7 95.5 96.0 97.3  MCH 30.8 30.4 29.7 29.6 30.2  MCHC 31.9 31.7 31.1 30.8 31.0  RDW 14.6 14.7 14.7 14.8 14.6  LYMPHSABS 1.4 1.6 2.4 2.6 1.7  MONOABS 0.9 0.9 1.0 0.9 0.8  EOSABS 0.1 0.1 0.1 0.1 0.1  BASOSABS 0.0 0.0 0.0 0.0 0.0    Recent Labs  Lab 06/18/23 0450 06/19/23 0535 06/20/23 0531 06/21/23 0843 06/22/23 0440  NA 137 138 139 138 138  K 3.4* 3.4* 4.0 3.2* 3.8  CL 103 101 99 98 100  CO2 27 27 23 28 28   ANIONGAP 7 10 17* 12 10  GLUCOSE 87 89 82 98 100*  BUN 8 8 9 10 8   CREATININE 0.59 0.65 0.76 0.69 0.61  AST 26 25 41 23 20  ALT 23 25 26 26 24   ALKPHOS 55 48 52 47 49  BILITOT 0.8 0.5 1.1 0.6 0.4  ALBUMIN  2.2* 2.2* 2.4* 2.4* 2.5*  PROCALCITON 0.13 0.11 <0.10 <0.10 <0.10  BNP 52.7 33.1 27.4 16.4  --   MG 1.7 1.7 1.8 1.6*  --   PHOS 1.6* 2.0* 2.4* 2.1*  --   CALCIUM  7.7* 7.5* 7.7* 7.9* 8.0*      Recent Labs  Lab 06/18/23 0450 06/19/23 0535 06/20/23 0531 06/21/23 0843 06/22/23 0440  PROCALCITON 0.13 0.11 <0.10 <0.10 <0.10  BNP 52.7 33.1 27.4 16.4  --   MG 1.7 1.7 1.8 1.6*  --   CALCIUM  7.7* 7.5* 7.7* 7.9* 8.0*    --------------------------------------------------------------------------------------------------------------- Lab Results  Component Value Date   CHOL 146 09/29/2022   HDL 42 09/29/2022   LDLCALC 76 09/29/2022   LDLDIRECT 59.0 09/26/2020   TRIG 162 (H) 09/29/2022   CHOLHDL 3.5 09/29/2022    Lab Results  Component Value Date   HGBA1C 5.6 06/14/2023    Radiology Reports DG Abd Portable 1V Result Date: 06/23/2023 CLINICAL DATA:  83 year old female is constipated. EXAM: PORTABLE ABDOMEN - 1 VIEW  COMPARISON:  06/22/2023 radiographs and earlier. FINDINGS: Portable AP supine view at 0752 hours. Gas Phil large and small bowel loops throughout the abdomen and pelvis redemonstrated, similar to multiple abdominal radiographs since 06/17/2023. No significant retained stool. Lung bases appear negative. Stable visualized osseous structures. Previous left femur ORIF. IMPRESSION: Ongoing gas distended small and large bowel loops. Differential considerations are ileus versus distal colonic obstruction (less likely given rectal gas on 06/20/2023 comparison. No significant retained stool. Electronically Signed   By: VEAR Hurst M.D.   On: 06/23/2023 08:45   DG Abd Portable 1V Result Date: 06/22/2023 CLINICAL DATA:  Constipation EXAM: PORTABLE ABDOMEN - 1 VIEW COMPARISON:  Two days ago FINDINGS: Generalized gaseous distension of colon and to a lesser extent affecting small bowel. No abnormal stool retention. No concerning mass effect or gas collection. The largest segment of gas dilated bowel is measured at the cecum, 11 cm and previously 9.4 cm. No clear volvulus pattern. IMPRESSION: Unchanged pattern of generalized gaseous distension of bowel. Greatest distension at the cecum where there is 11 cm in diameter, mildly progressed. Provided history of constipation, which is not seen radiographically. Electronically Signed   By: Dorn Roulette M.D.   On: 06/22/2023 10:37      Signature  -   Brayton Lye M.D on 06/24/2023 at 12:03 PM   -  To page go to [email or web address]

## 2023-06-24 NOTE — Progress Notes (Signed)
 Daily Progress Note  DOA: 06/13/2023 Hospital Day: 12   Chief Complaint:   ASSESSMENT    Brief Narrative:  [name] is a 83 y.o. 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 Admitted 1/25 with RSV PNA / sepsis requiring pressors. She has chronic abdominal pain. KUB yesterday shows progressive generalized ileus    Generalized abdominal pain and distended small and large bowel without significant stool burden on plain films  Suspect generalized ileus likely combination of recent severe illness, recent hypokalemia, narcotics, decreased mobility ( usually ambulates at home).  Today;  Gave dose of Mestinon  yesterday. She says abdominal pain is slightly better today. Having BMs / no nausea.   Hypokalemia K+ 3.1.    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 Surgery evaluated Dec 2024 and felt best to treat symptomatically as she was a high risk surgical candidate.     Chronic constipation.  Takes Linzess  and senna at home. Getting BID Miralax  inpatient   Chronic GERD Complains of reflux symptoms which isn't surprising in setting of generalized ileus.    Principal Problem:   Septic shock (HCC) Active Problems:   Ileus (HCC)   PLAN   --She hasn't been taking the miralax  ( last dose was evening of 2/3). Documented as being refused. Recommend she continue  twice daily MiraLAX  --Had only one dose of simethicone  in last few day ( ordered prn). Will change to BID dosing.  --Awaiting KUB results but clinically seems improved.  --Mestinon  60 mg BID.   --Needs to correct hypokalemia, potassium repletion in progress.   Subjective   No significant abdominal pain today Says she had a BM this am ( not yet documented). Her left leg is hurting. No nausea.    Objective    Recent Labs    06/22/23 0440 06/24/23 1239  WBC 8.0 9.1  HGB 11.1* 10.9*  HCT 35.8* 34.9*  PLT 281 322   BMET Recent Labs    06/22/23 0440 06/24/23 1239  NA 138 139  K 3.8 3.1*  CL 100 98  CO2 28 29  GLUCOSE 100* 143*  BUN 8 11  CREATININE 0.61 0.76  CALCIUM  8.0* 8.2*   LFT Recent Labs    06/22/23 0440  PROT 5.2*  ALBUMIN  2.5*  AST 20  ALT 24  ALKPHOS 49  BILITOT 0.4   PT/INR No results for input(s): LABPROT, INR in the last 72 hours.   Imaging:  DG Abd Portable 1V CLINICAL DATA:  83 year old female is constipated.  EXAM: PORTABLE ABDOMEN - 1 VIEW  COMPARISON:  06/22/2023 radiographs and earlier.  FINDINGS: Portable AP supine view at 0752 hours. Gas Phil large and small bowel loops throughout the abdomen and pelvis redemonstrated, similar to multiple abdominal radiographs since 06/17/2023. No significant retained stool.  Lung bases appear negative. Stable visualized osseous structures. Previous left femur ORIF.  IMPRESSION: Ongoing gas distended small and large bowel loops. Differential considerations are ileus versus distal colonic obstruction (less likely given rectal gas on 06/20/2023 comparison. No significant retained stool.  Electronically Signed   By: VEAR Hurst M.D.   On: 06/23/2023 08:45  Scheduled inpatient medications:   allopurinol   300 mg Oral Daily   bisacodyl   10 mg Rectal Once   Chlorhexidine  Gluconate Cloth  6 each Topical Daily   docusate sodium   200 mg Oral BID   donepezil   10 mg Oral QHS   famotidine   20 mg Oral QHS   feeding supplement  237 mL Oral BID BM   furosemide   60 mg Oral Daily   heparin   5,000 Units Subcutaneous Q8H   insulin  aspart  0-6 Units Subcutaneous TID WC   levothyroxine    50 mcg Oral Daily   metoprolol  tartrate  50 mg Oral BID   morphine   15 mg Oral Q12H   oseltamivir   75 mg Oral Once   pantoprazole   40 mg Oral Daily   polyethylene glycol  17 g Oral BID   potassium chloride   40 mEq Oral Q6H   predniSONE   20 mg Oral Q breakfast   pyridostigmine   60 mg Oral BID   spironolactone   50 mg Oral Daily   Continuous inpatient infusions:   potassium PHOSPHATE  30 mmol in dextrose  5 % 250 mL infusion     PRN inpatient medications: acetaminophen  **OR** acetaminophen , fluticasone , hydrALAZINE , ipratropium-albuterol , naLOXone  (NARCAN )  injection, ondansetron  (ZOFRAN ) IV, mouth rinse, oxyCODONE , simethicone   Vital signs in last 24 hours: Temp:  [98.2 F (36.8 C)-99 F (37.2 C)] 98.3 F (36.8 C) (02/05 1156) Pulse Rate:  [109-118] 117 (02/05 1200) Resp:  [18-21] 21 (02/05 1200) BP: (116-127)/(68-82) 121/75 (02/05 1200) SpO2:  [93 %-98 %] 98 % (02/05 1339) Weight:  [97.8 kg] 97.8 kg (02/05 1057) Last BM Date : 06/24/23  Intake/Output Summary (Last 24 hours) at 06/24/2023 1415 Last data filed at 06/23/2023 2138 Gross per 24 hour  Intake 997.04 ml  Output --  Net 997.04 ml    Intake/Output from previous day: 02/04 0701 - 02/05 0700 In: 997 [I.V.:997] Out: -  Intake/Output this shift: No intake/output data recorded.   Physical Exam:  General: Alert female in NAD in bedside chair Heart:  Regular rate and rhythm.  Pulmonary: Normal respiratory effort Abdomen: Soft, mildly distended with tympany, nontender. Normal bowel sounds. Extremities: No lower extremity edema  Neurologic: Alert and oriented Psych: Pleasant. Cooperative.  Normal     LOS: 11 days   [name] ,NP 06/24/2023, 2:15 PM

## 2023-06-25 DIAGNOSIS — R1084 Generalized abdominal pain: Secondary | ICD-10-CM | POA: Diagnosis not present

## 2023-06-25 DIAGNOSIS — K567 Ileus, unspecified: Secondary | ICD-10-CM | POA: Diagnosis not present

## 2023-06-25 DIAGNOSIS — E876 Hypokalemia: Secondary | ICD-10-CM | POA: Diagnosis not present

## 2023-06-25 DIAGNOSIS — A419 Sepsis, unspecified organism: Secondary | ICD-10-CM | POA: Diagnosis not present

## 2023-06-25 DIAGNOSIS — K5909 Other constipation: Secondary | ICD-10-CM | POA: Diagnosis not present

## 2023-06-25 DIAGNOSIS — R6521 Severe sepsis with septic shock: Secondary | ICD-10-CM | POA: Diagnosis not present

## 2023-06-25 LAB — GLUCOSE, CAPILLARY
Glucose-Capillary: 90 mg/dL (ref 70–99)
Glucose-Capillary: 159 mg/dL — ABNORMAL HIGH (ref 70–99)
Glucose-Capillary: 173 mg/dL — ABNORMAL HIGH (ref 70–99)
Glucose-Capillary: 135 mg/dL — ABNORMAL HIGH (ref 70–99)

## 2023-06-25 LAB — CBC
HCT: 32.4 % — ABNORMAL LOW (ref 36.0–46.0)
Hemoglobin: 10.1 g/dL — ABNORMAL LOW (ref 12.0–15.0)
MCH: 30.2 pg (ref 26.0–34.0)
MCHC: 31.2 g/dL (ref 30.0–36.0)
MCV: 97 fL (ref 80.0–100.0)
Platelets: 305 10*3/uL (ref 150–400)
RBC: 3.34 MIL/uL — ABNORMAL LOW (ref 3.87–5.11)
RDW: 14.7 % (ref 11.5–15.5)
WBC: 7.3 10*3/uL (ref 4.0–10.5)
nRBC: 0 % (ref 0.0–0.2)

## 2023-06-25 LAB — PHOSPHORUS: Phosphorus: 2.6 mg/dL (ref 2.5–4.6)

## 2023-06-25 LAB — BASIC METABOLIC PANEL
Anion gap: 12 (ref 5–15)
BUN: 11 mg/dL (ref 8–23)
CO2: 26 mmol/L (ref 22–32)
Calcium: 8 mg/dL — ABNORMAL LOW (ref 8.9–10.3)
Chloride: 104 mmol/L (ref 98–111)
Creatinine, Ser: 0.9 mg/dL (ref 0.44–1.00)
GFR, Estimated: >60 mL/min (ref >60)
Glucose, Bld: 98 mg/dL (ref 70–99)
Potassium: 3.2 mmol/L — ABNORMAL LOW (ref 3.5–5.1)
Sodium: 142 mmol/L (ref 135–145)

## 2023-06-25 LAB — MAGNESIUM: Magnesium: 1.7 mg/dL (ref 1.7–2.4)

## 2023-06-25 MED ORDER — SIMETHICONE 80 MG PO CHEW: 80.0000 mg | CHEWABLE_TABLET | Freq: Four times a day (QID) | ORAL | Status: DC | PRN

## 2023-06-25 NOTE — Progress Notes (Signed)
 Physical Therapy Treatment Patient Details Name: [name] MRN: [id number] DOB: Jun 21, 1940 Today's Date: 06/25/2023   History of Present Illness 83 year old female presents to [facility] ED on 1/25 with altered mental status. Acute encephalopathy due to severe sepsis, +RSV, + Flu. PMH significant for DMT2, HTN, chronic pain syndrome on morphine , RA on chronic steroids, ILD, bronchiectasis, chronic respiratory failure on O2, CKD 3b, chronic diastolic HF, PE/DVT on Eliquis , hypothyroidism.    PT Comments  Pt seen for second session at request on RN and MD. Pt demonstrated continued improvement with only needing Min A for bed mobility and +2 Min A to stand in stedy. Anticipate that pt can begin to move away from stedy in near future. No change in DC/DME recs at this time. PT will continue to follow.     If plan is discharge home, recommend the following: Two people to help with walking and/or transfers;A lot of help with bathing/dressing/bathroom;Assistance with cooking/housework;Direct supervision/assist for medications management;Direct supervision/assist for financial management;Assist for transportation;Help with stairs or ramp for entrance   Can travel by private vehicle     No  Equipment Recommendations  None recommended by PT    Recommendations for Other Services       Precautions / Restrictions Precautions Precautions: Fall Restrictions Weight Bearing Restrictions Per Provider Order: No     Mobility  Bed Mobility Overal bed mobility: Needs Assistance Bed Mobility: Supine to Sit Rolling: Mod assist   Supine to sit: Min assist Sit to supine: +2 for physical assistance, Min assist   General bed mobility comments: Pt able to sit EOB mostly on her own. Min A for trunk elevation.    Transfers Overall transfer level: Needs assistance Equipment used: Ambulation equipment used Transfers: Sit to/from Stand, Bed to chair/wheelchair/BSC Sit to Stand: +2 physical assistance, Min  assist           General transfer comment: Pt able to stand twice in stedy with +2 Min A. Transfer via Lift Equipment: Stedy  Ambulation/Gait                   Stairs             Wheelchair Mobility     Tilt Bed    Modified Rankin (Stroke Patients Only)       Balance Overall balance assessment: Needs assistance Sitting-balance support: Bilateral upper extremity supported, Feet supported Sitting balance-Leahy Scale: Fair     Standing balance support: Bilateral upper extremity supported, During functional activity Standing balance-Leahy Scale: Poor Standing balance comment: Reliant on stedy                            Cognition Arousal: Alert Behavior During Therapy: WFL for tasks assessed/performed Overall Cognitive Status: Within Functional Limits for tasks assessed                                 General Comments: Appeared to be [facility]        Exercises      General Comments General comments (skin integrity, edema, etc.): VSS      Pertinent Vitals/Pain Pain Assessment Pain Assessment: No/denies pain    Home Living                          Prior Function  PT Goals (current goals can now be found in the care plan section) Progress towards PT goals: Progressing toward goals    Frequency    Min 1X/week      PT Plan      Co-evaluation              AM-PAC PT 6 Clicks Mobility   Outcome Measure  Help needed turning from your back to your side while in a flat bed without using bedrails?: A Little Help needed moving from lying on your back to sitting on the side of a flat bed without using bedrails?: A Lot Help needed moving to and from a bed to a chair (including a wheelchair)?: Total Help needed standing up from a chair using your arms (e.g., wheelchair or bedside chair)?: Total Help needed to walk in hospital room?: Total Help needed climbing 3-5 steps with a railing? :  Total 6 Click Score: 9    End of Session Equipment Utilized During Treatment: Oxygen  Activity Tolerance: Other (comment) (Limited by bowel incontinence) Patient left: with call bell/phone within reach;in bed;with bed alarm set Nurse Communication: Mobility status PT Visit Diagnosis: Difficulty in walking, not elsewhere classified (R26.2);Muscle weakness (generalized) (M62.81)     Time: 8487-8475 PT Time Calculation (min) (ACUTE ONLY): 12 min  Charges:    $Therapeutic Activity: 8-22 mins PT General Charges $$ ACUTE PT VISIT: 1 Visit                     Sueellen NOVAK, PT, DPT Acute Rehab Services [phone]    Ahmani Prehn 06/25/2023, 4:02 PM

## 2023-06-25 NOTE — Telephone Encounter (Signed)
 Sorr I  could not go see her.  But I was working in the office and then I had to work the weekend at another hospital and then I had to go for a meeting.  I did speak to Dr. Dennise and he said she was doing better.  Please ensure there is office visit follow-up.  She had both influenza and RSV.  Therefore she needs to be seen within a few weeks at least by nurse practitioner.  This because ILD could be getting worse.

## 2023-06-25 NOTE — Plan of Care (Signed)

## 2023-06-25 NOTE — Progress Notes (Signed)
 PROGRESS NOTE                                                                                                                                                                                                             Patient Demographics:    [name], is a 83 y.o. female, DOB - 1941-03-28, FMW:[id number]  Outpatient Primary MD for the patient is Mercer Clotilda SAUNDERS, MD    LOS - 12  Admit date - 06/13/2023    Chief Complaint  Patient presents with   Drug Overdose       Brief Narrative (HPI from H&P)     83 year old female with PMH significant for DMT2, HTN, chronic pain syndrome on morphine , RA on chronic steroids, ILD 3 L nasal cannula oxygen  chronically, bronchiectasis, chronic respiratory failure on O2, CKD 3b, chronic diastolic HF, PE/DVT on Eliquis , hypothyroidism presents to [facility] ED on 1/25 with altered mental status, for suggestive of sepsis due to RSV pneumonia she was admitted to ICU on pressors stabilized and transferred to my care on 06/17/2023.   Subjective:   Send had few BMs overnight, loose, but reports bloating and abdominal pain much improved   Assessment  & Plan :   Septic shock secondary to flu A / RSV pneumonia:    And secondary to UTI (Klebsiella pneumonia and E. coli) -Possible concomitant bacterial infection but seems less likely.  [name] admitted to ICU required Levophed , now stabilized sepsis pathophysiology has resolved, finishing her Tamiflu  course, also finishing 5 days of Rocephin , chronically on azithromycin  at baseline which will be continued.  Blood cultures with 2 different staph species in 2 different sets likely contamination.  Urine culture noted sensitive to Rocephin  which should suffice.  Overall much better, advance activity, PT OT, titrate down oxygen .  Encouraged to use I-S and flutter valve for pulmonary toiletry.  Clinically improving.    Chronic hypoxic respiratory failure: ILD.   History of rheumatoid arthritis.  Chronically on 3 L nasal cannula oxygen . History of rheumatoid arthritis / Interstitial lung disease:   -She follows with Dr. Geronimo. Hold etanercept, plaquenil , leflunomide  in setting of viral infection.  New home oral steroid at slightly higher dose due to acute stress, supportive care and monitor.   Acute encephalopathy due to severe sepsis:  -CT head nonacute, no headache or focal deficits.  With supportive  care mentation has improved.  Continue to hold Flexeril , Cymbalta  and gabapentin .  Continue oxycodone  and chronic morphine  for now with caution.  Narcan  on board.   Abdominal distention/pain Attended small bowel/large bowel Ileus?? -Chronic abdominal pain with chronic high-dose narcotic use.   -Patient with significant abdominal distention, but overall exam is benign . -GI input greatly appreciated, appears to be improving on pyridostigmine  trial yesterday, so plan to continue x 48 hours, continue with scheduled laxatives and simethicone    Chronic diastolic CHF/ HTN:  -Last 2D echo in 2023 showed LVEF of 55% with grade 1 diastolic dysfunction, Continue hydralazine  25 mg 3 times daily, continue beta-blocker, as needed hydralazine  IV for blood pressure control.  See below.    AKI on CKD stage IIIa: Serum creatinine improved with IV fluids. Shock Resolved.  Encourage oral intake, resuming diuretics on 06/18/2023, ARB on 06/19/2023.   History of DVT/PE: Currently not on AC. Telemetry monitoring   Chronic anemia: H&H remains stable.  Monitor H&H.  No signs of obvious bleeding.   Hypothyroidism: Continue levothyroxine .   Hypertension.  Currently on combination of beta-blocker, hydralazine .  Will add low-dose Norvasc  and monitor.  As needed hydralazine  IV also added.  Medications adjusted on 06/21/2023 as technically mildly dehydrated, gentle IV fluids and monitor.  Hx of dementia: Aricept  resumed.   Hypokalemia and hypophosphatemia.  Replaced.     Diabetes mellitus type II - SSI and CBG monitoring, Hb A1c 5.6.  Well-controlled.  Lab Results  Component Value Date   HGBA1C 5.6 06/14/2023   CBG (last 3)  Recent Labs    06/24/23 1601 06/24/23 2011 06/25/23 1104  GLUCAP 177* 148* 90        Condition - Extremely Guarded  Family Communication  :   Discussed with daughter at bedside  Code Status :  Full  Consults  :  PCCM  PUD Prophylaxis : PPI   Procedures  :     TTE -  1. Left ventricular ejection fraction, by estimation, is 60 to 65%. The left ventricle has normal function. The left ventricle has no regional wall motion abnormalities. Left ventricular diastolic parameters are consistent with Grade I diastolic dysfunction (impaired relaxation).  2. Right ventricular systolic function is normal. The right ventricular size is normal. Tricuspid regurgitation signal is inadequate for assessing PA pressure.  3. The mitral valve is normal in structure. No evidence of mitral valve regurgitation. No evidence of mitral stenosis.  4. The aortic valve is tricuspid. There is mild calcification of the aortic valve. Aortic valve regurgitation is not visualized. No aortic stenosis is present.  5. IVC not visualized.  6. Exam truncated due to patient noncompliance  CT Head - Non acute      Disposition Plan  :    Status is: Inpatient  DVT Prophylaxis  :    heparin  injection 5,000 Units Start: 06/13/23 2200    Lab Results  Component Value Date   PLT 305 06/25/2023    Diet :  Diet Order             Diet full liquid Room service appropriate? Yes; Fluid consistency: Thin  Diet effective now                    Inpatient Medications  Scheduled Meds:  allopurinol   300 mg Oral Daily   bisacodyl   10 mg Rectal Once   Chlorhexidine  Gluconate Cloth  6 each Topical Daily   docusate sodium   200 mg Oral BID  donepezil   10 mg Oral QHS   famotidine   20 mg Oral QHS   feeding supplement  237 mL Oral BID BM   furosemide   60  mg Oral Daily   heparin   5,000 Units Subcutaneous Q8H   insulin  aspart  0-6 Units Subcutaneous TID WC   levothyroxine   50 mcg Oral Daily   metoprolol  tartrate  50 mg Oral BID   morphine   15 mg Oral Q12H   oseltamivir   75 mg Oral Once   pantoprazole   40 mg Oral Daily   polyethylene glycol  17 g Oral BID   predniSONE   20 mg Oral Q breakfast   pyridostigmine   60 mg Oral BID   spironolactone   50 mg Oral Daily   Continuous Infusions:    PRN Meds:.acetaminophen  **OR** acetaminophen , fluticasone , hydrALAZINE , ipratropium-albuterol , naLOXone  (NARCAN )  injection, ondansetron  (ZOFRAN ) IV, mouth rinse, oxyCODONE , simethicone      Objective:   Vitals:   06/25/23 0430 06/25/23 0440 06/25/23 0450 06/25/23 0853  BP:    112/66  Pulse: 93 99 99 (!) 117  Resp:    14  Temp:      TempSrc:      SpO2: 96% 96% 98% 94%  Weight:      Height:        Wt Readings from Last 3 Encounters:  06/25/23 98.7 kg  06/12/23 103.1 kg  06/02/23 105.9 kg     Intake/Output Summary (Last 24 hours) at 06/25/2023 1357 Last data filed at 06/25/2023 0300 Gross per 24 hour  Intake 300 ml  Output 850 ml  Net -550 ml     Physical Exam  Awake Alert, Oriented X 3, No new F.N deficits, Normal affect Symmetrical Chest wall movement, Good air movement bilaterally, CTAB RRR,No Gallops,Rubs or new Murmurs, No Parasternal Heave +ve B.Sounds, diminished soft today, no distention. No Cyanosis, Clubbing or edema, No new Rash or bruise        RN pressure injury documentation: Pressure Injury 03/30/21 Ischial tuberosity Right;Left;Posterior Stage 1 -  Intact skin with non-blanchable redness of a localized area usually over a bony prominence. (Active)  03/30/21 1800  Location: Ischial tuberosity  Location Orientation: Right;Left;Posterior  Staging: Stage 1 -  Intact skin with non-blanchable redness of a localized area usually over a bony prominence.  Wound Description (Comments):   Present on Admission: Yes       Data Review:    Recent Labs  Lab 06/19/23 0535 06/20/23 0531 06/21/23 0843 06/22/23 0440 06/24/23 1239 06/25/23 0904  WBC 7.0 8.0 9.1 8.0 9.1 7.3  HGB 9.9* 10.5* 10.3* 11.1* 10.9* 10.1*  HCT 31.2* 33.8* 33.4* 35.8* 34.9* 32.4*  PLT 156 237 260 281 322 305  MCV 95.7 95.5 96.0 97.3 95.9 97.0  MCH 30.4 29.7 29.6 30.2 29.9 30.2  MCHC 31.7 31.1 30.8 31.0 31.2 31.2  RDW 14.7 14.7 14.8 14.6 14.6 14.7  LYMPHSABS 1.6 2.4 2.6 1.7  --   --   MONOABS 0.9 1.0 0.9 0.8  --   --   EOSABS 0.1 0.1 0.1 0.1  --   --   BASOSABS 0.0 0.0 0.0 0.0  --   --     Recent Labs  Lab 06/19/23 0535 06/20/23 0531 06/21/23 0843 06/22/23 0440 06/24/23 1239 06/25/23 0904  NA 138 139 138 138 139 142  K 3.4* 4.0 3.2* 3.8 3.1* 3.2*  CL 101 99 98 100 98 104  CO2 27 23 28 28 29 26   ANIONGAP 10 17* 12 10 12  12  GLUCOSE 89 82 98 100* 143* 98  BUN 8 9 10 8 11 11   CREATININE 0.65 0.76 0.69 0.61 0.76 0.90  AST 25 41 23 20  --   --   ALT 25 26 26 24   --   --   ALKPHOS 48 52 47 49  --   --   BILITOT 0.5 1.1 0.6 0.4  --   --   ALBUMIN  2.2* 2.4* 2.4* 2.5*  --   --   PROCALCITON 0.11 <0.10 <0.10 <0.10  --   --   BNP 33.1 27.4 16.4  --   --   --   MG 1.7 1.8 1.6*  --  1.8 1.7  PHOS 2.0* 2.4* 2.1*  --  2.1* 2.6  CALCIUM  7.5* 7.7* 7.9* 8.0* 8.2* 8.0*      Recent Labs  Lab 06/19/23 0535 06/20/23 0531 06/21/23 0843 06/22/23 0440 06/24/23 1239 06/25/23 0904  PROCALCITON 0.11 <0.10 <0.10 <0.10  --   --   BNP 33.1 27.4 16.4  --   --   --   MG 1.7 1.8 1.6*  --  1.8 1.7  CALCIUM  7.5* 7.7* 7.9* 8.0* 8.2* 8.0*    --------------------------------------------------------------------------------------------------------------- Lab Results  Component Value Date   CHOL 146 09/29/2022   HDL 42 09/29/2022   LDLCALC 76 09/29/2022   LDLDIRECT 59.0 09/26/2020   TRIG 162 (H) 09/29/2022   CHOLHDL 3.5 09/29/2022    Lab Results  Component Value Date   HGBA1C 5.6 06/14/2023    Radiology Reports DG Abd 1  View Result Date: 06/24/2023 CLINICAL DATA:  Ileus. EXAM: ABDOMEN - 1 VIEW COMPARISON:  Abdominal radiograph dated 06/23/2023. FINDINGS: Dilated small and large bowel as seen on the prior radiograph. No free air identified. Degenerative changes of the spine. No acute osseous pathology. IMPRESSION: Persistent air distention of the small and large bowel. Electronically Signed   By: Vanetta Chou M.D.   On: 06/24/2023 14:18   DG Abd Portable 1V Result Date: 06/23/2023 CLINICAL DATA:  83 year old female is constipated. EXAM: PORTABLE ABDOMEN - 1 VIEW COMPARISON:  06/22/2023 radiographs and earlier. FINDINGS: Portable AP supine view at 0752 hours. Gas Phil large and small bowel loops throughout the abdomen and pelvis redemonstrated, similar to multiple abdominal radiographs since 06/17/2023. No significant retained stool. Lung bases appear negative. Stable visualized osseous structures. Previous left femur ORIF. IMPRESSION: Ongoing gas distended small and large bowel loops. Differential considerations are ileus versus distal colonic obstruction (less likely given rectal gas on 06/20/2023 comparison. No significant retained stool. Electronically Signed   By: VEAR Hurst M.D.   On: 06/23/2023 08:45   DG Abd Portable 1V Result Date: 06/22/2023 CLINICAL DATA:  Constipation EXAM: PORTABLE ABDOMEN - 1 VIEW COMPARISON:  Two days ago FINDINGS: Generalized gaseous distension of colon and to a lesser extent affecting small bowel. No abnormal stool retention. No concerning mass effect or gas collection. The largest segment of gas dilated bowel is measured at the cecum, 11 cm and previously 9.4 cm. No clear volvulus pattern. IMPRESSION: Unchanged pattern of generalized gaseous distension of bowel. Greatest distension at the cecum where there is 11 cm in diameter, mildly progressed. Provided history of constipation, which is not seen radiographically. Electronically Signed   By: Dorn Roulette M.D.   On: 06/22/2023 10:37       Signature  -   Brayton Lye M.D on 06/25/2023 at 1:57 PM   -  To page go to [email or web address]

## 2023-06-25 NOTE — Progress Notes (Signed)
 Daily Progress Note  DOA: 06/13/2023 Hospital Day: 13   Chief Complaint:  ileus  ASSESSMENT    Brief Narrative:  [name] is a 83 y.o. 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 Admitted 1/25 with RSV PNA / sepsis requiring pressors. GI consulted 2/4 for abdominal pain / generalized ileus     Prolonged generalized ileus  Suspect combination of recent severe illness, hypokalemia, narcotics, decreased mobility ( usually ambulates at home).  Today;  Recurrent abdominal pain earlier this am after taking one of her medications. Reviewed MAR, she hasn't started any new medications no did she get PO potassium this am. Abdomen is soft, non-tender and she is having BMs.  Unclear why the recurring generalized abdominal pain.    Hypokalemia K+ 3.2.    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 Surgery evaluated Dec 2024 and felt best to treat symptomatically as she was a high risk surgical candidate.     Chronic constipation.  Takes Linzess  and senna at home. Getting colace and BID Miralax  inpatient. Skipping doses of miralax  but still having daily BMs   Principal Problem:   Septic shock (HCC) Active Problems:   Ileus (HCC)   PLAN   --Continue Miralax  but will decrease dose to once daily since she isn't taking it twice daily anyway. . --Continue colace --Continue BID Mestinon  for now --Repeat KUB tomorrow. Hopefully ileus resolving since she has improved clinically --Please continue to replete K+  Subjective   Had bad generalized abdominal pain  again this am. Says it occurred after taking one of her medications.She feels the improvement in abdominal distention . Having BMs   Objective    Recent Labs    06/24/23 1239 06/25/23 0904  WBC 9.1 7.3  HGB 10.9* 10.1*  HCT 34.9* 32.4*  PLT 322 305   BMET Recent Labs    06/24/23 1239 06/25/23 0904  NA 139 142  K 3.1* 3.2*  CL 98 104  CO2 29 26  GLUCOSE 143* 98  BUN 11 11  CREATININE 0.76 0.90  CALCIUM  8.2* 8.0*   LFT No results for input(s): PROT, ALBUMIN , AST, ALT, ALKPHOS, BILITOT, BILIDIR, IBILI in the last 72 hours. PT/INR No results for input(s): LABPROT, INR in the last 72 hours.   Imaging:  DG Abd 1 View CLINICAL DATA:  Ileus.  EXAM: ABDOMEN - 1 VIEW  COMPARISON:  Abdominal radiograph dated 06/23/2023.  FINDINGS: Dilated small and large bowel as seen on the prior radiograph. No free air identified. Degenerative changes of the spine. No acute osseous pathology.  IMPRESSION: Persistent air distention of the small and large bowel.  Electronically Signed   By: Vanetta Chou M.D.   On: 06/24/2023 14:18     Scheduled inpatient medications:   allopurinol   300 mg Oral Daily   bisacodyl   10 mg Rectal Once   Chlorhexidine  Gluconate Cloth  6 each Topical Daily   docusate sodium   200 mg Oral BID   donepezil   10 mg Oral QHS   famotidine   20 mg Oral QHS   feeding supplement  237 mL Oral BID BM   furosemide   60 mg Oral Daily   heparin   5,000 Units Subcutaneous Q8H   insulin  aspart  0-6 Units Subcutaneous TID WC   levothyroxine   50 mcg Oral Daily   metoprolol  tartrate  50 mg Oral BID   morphine   15 mg Oral Q12H   oseltamivir   75 mg Oral Once   pantoprazole   40 mg Oral Daily   polyethylene glycol  17 g Oral BID   predniSONE   20 mg Oral Q breakfast   pyridostigmine   60 mg Oral BID   spironolactone   50 mg Oral Daily   Continuous inpatient infusions:  PRN inpatient medications: acetaminophen  **OR** acetaminophen , fluticasone ,  hydrALAZINE , ipratropium-albuterol , naLOXone  (NARCAN )  injection, ondansetron  (ZOFRAN ) IV, mouth rinse, oxyCODONE , simethicone   Vital signs in last 24 hours: Temp:  [97.9 F (36.6 C)-98.6 F (37 C)] 97.9 F (36.6 C) (02/06 0420) Pulse Rate:  [88-124] 117 (02/06 0853) Resp:  [14-20] 14 (02/06 0853) BP: (112-155)/(66-85) 112/66 (02/06 0853) SpO2:  [93 %-99 %] 94 % (02/06 0853) Weight:  [98.7 kg] 98.7 kg (02/06 0302) Last BM Date : 06/24/23  Intake/Output Summary (Last 24 hours) at 06/25/2023 1355 Last data filed at 06/25/2023 0300 Gross per 24 hour  Intake 300 ml  Output 850 ml  Net -550 ml    Intake/Output from previous day: 02/05 0701 - 02/06 0700 In: 660 [P.O.:660] Out: 850 [Urine:750; Stool:100] Intake/Output this shift: No intake/output data recorded.   Physical Exam:  General: Alert female in NAD Heart:  Regular rate and rhythm.  Pulmonary: Normal respiratory effort Abdomen: Soft, nondistended, nontender. Normal bowel sounds. Extremities: No lower extremity edema  Neurologic: Alert and oriented Psych: Pleasant. Cooperative.    LOS: 12 days   [name] ,NP 06/25/2023, 1:55 PM

## 2023-06-25 NOTE — Progress Notes (Signed)
 Physical Therapy Treatment Patient Details Name: [name] MRN: [id number] DOB: Jan 09, 1941 Today's Date: 06/25/2023   History of Present Illness 83 year old female presents to [facility] ED on 1/25 with altered mental status. Acute encephalopathy due to severe sepsis, +RSV, + Flu. PMH significant for DMT2, HTN, chronic pain syndrome on morphine , RA on chronic steroids, ILD, bronchiectasis, chronic respiratory failure on O2, CKD 3b, chronic diastolic HF, PE/DVT on Eliquis , hypothyroidism.    PT Comments  Pt with fair tolerance to treatment today. Pt was able progress with bed mobility today only requiring +2 Min A however transfer to chair was limited by bowel incontinence. No change in DC/DME recs at this time. PT will continue to follow.     If plan is discharge home, recommend the following: Two people to help with walking and/or transfers;A lot of help with bathing/dressing/bathroom;Assistance with cooking/housework;Direct supervision/assist for medications management;Direct supervision/assist for financial management;Assist for transportation;Help with stairs or ramp for entrance   Can travel by private vehicle     No  Equipment Recommendations  None recommended by PT    Recommendations for Other Services       Precautions / Restrictions Precautions Precautions: Fall Restrictions Weight Bearing Restrictions Per Provider Order: No     Mobility  Bed Mobility Overal bed mobility: Needs Assistance Bed Mobility: Supine to Sit, Sit to Supine Rolling: Mod assist   Supine to sit: +2 for physical assistance, Min assist Sit to supine: +2 for physical assistance, Min assist   General bed mobility comments: Pt able to put forth more effort today with bed mobility. Transfer to chair deferred due to bowel incontinence.    Transfers                   General transfer comment: Deferred due to bowel continence    Ambulation/Gait                   Stairs              Wheelchair Mobility     Tilt Bed    Modified Rankin (Stroke Patients Only)       Balance                                            Cognition Arousal: Alert Behavior During Therapy: WFL for tasks assessed/performed Overall Cognitive Status: Within Functional Limits for tasks assessed                                 General Comments: Appeared to be [facility]        Exercises      General Comments General comments (skin integrity, edema, etc.): VSS      Pertinent Vitals/Pain Pain Assessment Pain Assessment: No/denies pain    Home Living                          Prior Function            PT Goals (current goals can now be found in the care plan section) Progress towards PT goals: Progressing toward goals    Frequency    Min 1X/week      PT Plan      Co-evaluation  AM-PAC PT 6 Clicks Mobility   Outcome Measure  Help needed turning from your back to your side while in a flat bed without using bedrails?: A Little Help needed moving from lying on your back to sitting on the side of a flat bed without using bedrails?: A Lot Help needed moving to and from a bed to a chair (including a wheelchair)?: Total Help needed standing up from a chair using your arms (e.g., wheelchair or bedside chair)?: Total Help needed to walk in hospital room?: Total Help needed climbing 3-5 steps with a railing? : Total 6 Click Score: 9    End of Session Equipment Utilized During Treatment: Oxygen  Activity Tolerance: Other (comment) (Limited by bowel incontinence) Patient left: with call bell/phone within reach;in bed;with bed alarm set Nurse Communication: Mobility status PT Visit Diagnosis: Difficulty in walking, not elsewhere classified (R26.2);Muscle weakness (generalized) (M62.81)     Time: 8799-8781 PT Time Calculation (min) (ACUTE ONLY): 18 min  Charges:    $Therapeutic Activity: 8-22 mins PT  General Charges $$ ACUTE PT VISIT: 1 Visit                     Sueellen NOVAK, PT, DPT Acute Rehab Services [phone]    Manvi Guilliams 06/25/2023, 3:07 PM

## 2023-06-25 NOTE — TOC Progression Note (Addendum)
 Transition of Care [facility]) - Progression Note    Patient Details  Name: [name] MRN: [id number] Date of Birth: Feb 14, 1941  [facility] CM/SW Contact  Inocente GORMAN Kindle, LCSW Phone Number: 06/25/2023, 12:25 PM  Clinical Narrative:    12:25pm-CSW checking with [facility] on bed availability for tomorrow.   2:32 PM-CSW left voicemail for patient's daughter, Brad. No response from [facility] today.  5:24 PM-[facility] will not have bed openings. CSW left another voicemail for Brad. Per [facility], daughter toured there today.  Expected Discharge Plan: Skilled Nursing Facility Barriers to Discharge: Continued Medical Work up  Expected Discharge Plan and Services In-house Referral: Clinical Social Work     Living arrangements for the past 2 months: Single Family Home                                       Social Determinants of Health (SDOH) Interventions SDOH Screenings   Food Insecurity: No Food Insecurity (06/16/2023)  Housing: Low Risk  (06/16/2023)  Transportation Needs: No Transportation Needs (06/16/2023)  Utilities: Not At Risk (06/16/2023)  Alcohol  Screen: Low Risk  (10/27/2022)  Depression (PHQ2-9): High Risk (06/12/2023)  Financial Resource Strain: Low Risk  (06/11/2023)  Physical Activity: Inactive (06/11/2023)  Social Connections: Socially Integrated (06/16/2023)  Stress: No Stress Concern Present (10/27/2022)  Tobacco Use: Low Risk  (06/17/2023)    Readmission Risk Interventions    12/16/2022   11:09 AM  Readmission Risk Prevention Plan  Transportation Screening Complete  PCP or Specialist Appt within 5-7 Days Complete  Home Care Screening Complete  Medication Review (RN CM) Complete

## 2023-06-26 ENCOUNTER — Inpatient Hospital Stay (HOSPITAL_COMMUNITY): Payer: Medicare Other

## 2023-06-26 DIAGNOSIS — J189 Pneumonia, unspecified organism: Secondary | ICD-10-CM | POA: Diagnosis not present

## 2023-06-26 DIAGNOSIS — M059 Rheumatoid arthritis with rheumatoid factor, unspecified: Secondary | ICD-10-CM | POA: Diagnosis not present

## 2023-06-26 DIAGNOSIS — F329 Major depressive disorder, single episode, unspecified: Secondary | ICD-10-CM | POA: Diagnosis not present

## 2023-06-26 DIAGNOSIS — J9611 Chronic respiratory failure with hypoxia: Secondary | ICD-10-CM | POA: Diagnosis not present

## 2023-06-26 DIAGNOSIS — R0600 Dyspnea, unspecified: Secondary | ICD-10-CM | POA: Diagnosis not present

## 2023-06-26 DIAGNOSIS — J121 Respiratory syncytial virus pneumonia: Secondary | ICD-10-CM | POA: Insufficient documentation

## 2023-06-26 DIAGNOSIS — M05741 Rheumatoid arthritis with rheumatoid factor of right hand without organ or systems involvement: Secondary | ICD-10-CM | POA: Diagnosis not present

## 2023-06-26 DIAGNOSIS — F0390 Unspecified dementia without behavioral disturbance: Secondary | ICD-10-CM | POA: Diagnosis not present

## 2023-06-26 DIAGNOSIS — G8929 Other chronic pain: Secondary | ICD-10-CM | POA: Diagnosis not present

## 2023-06-26 DIAGNOSIS — I5032 Chronic diastolic (congestive) heart failure: Secondary | ICD-10-CM | POA: Diagnosis not present

## 2023-06-26 DIAGNOSIS — R935 Abnormal findings on diagnostic imaging of other abdominal regions, including retroperitoneum: Secondary | ICD-10-CM | POA: Diagnosis not present

## 2023-06-26 DIAGNOSIS — E1140 Type 2 diabetes mellitus with diabetic neuropathy, unspecified: Secondary | ICD-10-CM | POA: Diagnosis present

## 2023-06-26 DIAGNOSIS — N281 Cyst of kidney, acquired: Secondary | ICD-10-CM | POA: Diagnosis not present

## 2023-06-26 DIAGNOSIS — K6389 Other specified diseases of intestine: Secondary | ICD-10-CM | POA: Diagnosis not present

## 2023-06-26 DIAGNOSIS — E039 Hypothyroidism, unspecified: Secondary | ICD-10-CM | POA: Diagnosis not present

## 2023-06-26 DIAGNOSIS — J849 Interstitial pulmonary disease, unspecified: Secondary | ICD-10-CM | POA: Diagnosis present

## 2023-06-26 DIAGNOSIS — J4489 Other specified chronic obstructive pulmonary disease: Secondary | ICD-10-CM | POA: Diagnosis present

## 2023-06-26 DIAGNOSIS — E66812 Obesity, class 2: Secondary | ICD-10-CM | POA: Diagnosis present

## 2023-06-26 DIAGNOSIS — M109 Gout, unspecified: Secondary | ICD-10-CM | POA: Diagnosis present

## 2023-06-26 DIAGNOSIS — K529 Noninfective gastroenteritis and colitis, unspecified: Secondary | ICD-10-CM | POA: Diagnosis not present

## 2023-06-26 DIAGNOSIS — M1A09X0 Idiopathic chronic gout, multiple sites, without tophus (tophi): Secondary | ICD-10-CM | POA: Diagnosis not present

## 2023-06-26 DIAGNOSIS — I10 Essential (primary) hypertension: Secondary | ICD-10-CM | POA: Diagnosis not present

## 2023-06-26 DIAGNOSIS — R0902 Hypoxemia: Secondary | ICD-10-CM | POA: Diagnosis not present

## 2023-06-26 DIAGNOSIS — K219 Gastro-esophageal reflux disease without esophagitis: Secondary | ICD-10-CM | POA: Diagnosis present

## 2023-06-26 DIAGNOSIS — Z1152 Encounter for screening for COVID-19: Secondary | ICD-10-CM | POA: Diagnosis not present

## 2023-06-26 DIAGNOSIS — J09X1 Influenza due to identified novel influenza A virus with pneumonia: Secondary | ICD-10-CM | POA: Diagnosis not present

## 2023-06-26 DIAGNOSIS — K567 Ileus, unspecified: Secondary | ICD-10-CM | POA: Diagnosis not present

## 2023-06-26 DIAGNOSIS — R000 Tachycardia, unspecified: Secondary | ICD-10-CM | POA: Diagnosis not present

## 2023-06-26 DIAGNOSIS — J84115 Respiratory bronchiolitis interstitial lung disease: Secondary | ICD-10-CM | POA: Diagnosis not present

## 2023-06-26 DIAGNOSIS — K5900 Constipation, unspecified: Secondary | ICD-10-CM | POA: Diagnosis not present

## 2023-06-26 DIAGNOSIS — R6521 Severe sepsis with septic shock: Secondary | ICD-10-CM | POA: Diagnosis not present

## 2023-06-26 DIAGNOSIS — R2681 Unsteadiness on feet: Secondary | ICD-10-CM | POA: Diagnosis not present

## 2023-06-26 DIAGNOSIS — J9811 Atelectasis: Secondary | ICD-10-CM | POA: Diagnosis present

## 2023-06-26 DIAGNOSIS — I444 Left anterior fascicular block: Secondary | ICD-10-CM | POA: Diagnosis present

## 2023-06-26 DIAGNOSIS — K56609 Unspecified intestinal obstruction, unspecified as to partial versus complete obstruction: Secondary | ICD-10-CM | POA: Diagnosis not present

## 2023-06-26 DIAGNOSIS — Z6835 Body mass index (BMI) 35.0-35.9, adult: Secondary | ICD-10-CM | POA: Diagnosis not present

## 2023-06-26 DIAGNOSIS — N1831 Chronic kidney disease, stage 3a: Secondary | ICD-10-CM | POA: Diagnosis not present

## 2023-06-26 DIAGNOSIS — B974 Respiratory syncytial virus as the cause of diseases classified elsewhere: Secondary | ICD-10-CM | POA: Diagnosis not present

## 2023-06-26 DIAGNOSIS — A419 Sepsis, unspecified organism: Secondary | ICD-10-CM | POA: Diagnosis not present

## 2023-06-26 DIAGNOSIS — D500 Iron deficiency anemia secondary to blood loss (chronic): Secondary | ICD-10-CM | POA: Diagnosis present

## 2023-06-26 DIAGNOSIS — J111 Influenza due to unidentified influenza virus with other respiratory manifestations: Secondary | ICD-10-CM | POA: Insufficient documentation

## 2023-06-26 DIAGNOSIS — J9601 Acute respiratory failure with hypoxia: Secondary | ICD-10-CM | POA: Diagnosis not present

## 2023-06-26 DIAGNOSIS — Z7952 Long term (current) use of systemic steroids: Secondary | ICD-10-CM | POA: Diagnosis not present

## 2023-06-26 DIAGNOSIS — N200 Calculus of kidney: Secondary | ICD-10-CM | POA: Diagnosis not present

## 2023-06-26 DIAGNOSIS — M069 Rheumatoid arthritis, unspecified: Secondary | ICD-10-CM | POA: Diagnosis present

## 2023-06-26 DIAGNOSIS — E119 Type 2 diabetes mellitus without complications: Secondary | ICD-10-CM | POA: Diagnosis not present

## 2023-06-26 DIAGNOSIS — M05742 Rheumatoid arthritis with rheumatoid factor of left hand without organ or systems involvement: Secondary | ICD-10-CM | POA: Diagnosis not present

## 2023-06-26 DIAGNOSIS — J8410 Pulmonary fibrosis, unspecified: Secondary | ICD-10-CM | POA: Diagnosis present

## 2023-06-26 DIAGNOSIS — Z86718 Personal history of other venous thrombosis and embolism: Secondary | ICD-10-CM | POA: Diagnosis not present

## 2023-06-26 DIAGNOSIS — Z7401 Bed confinement status: Secondary | ICD-10-CM | POA: Diagnosis not present

## 2023-06-26 DIAGNOSIS — E876 Hypokalemia: Secondary | ICD-10-CM | POA: Diagnosis not present

## 2023-06-26 DIAGNOSIS — E785 Hyperlipidemia, unspecified: Secondary | ICD-10-CM | POA: Diagnosis not present

## 2023-06-26 DIAGNOSIS — K5732 Diverticulitis of large intestine without perforation or abscess without bleeding: Secondary | ICD-10-CM | POA: Diagnosis present

## 2023-06-26 DIAGNOSIS — E860 Dehydration: Secondary | ICD-10-CM | POA: Diagnosis present

## 2023-06-26 DIAGNOSIS — R1084 Generalized abdominal pain: Secondary | ICD-10-CM | POA: Diagnosis not present

## 2023-06-26 DIAGNOSIS — K5909 Other constipation: Secondary | ICD-10-CM | POA: Diagnosis not present

## 2023-06-26 DIAGNOSIS — D631 Anemia in chronic kidney disease: Secondary | ICD-10-CM | POA: Diagnosis not present

## 2023-06-26 DIAGNOSIS — M6281 Muscle weakness (generalized): Secondary | ICD-10-CM | POA: Diagnosis not present

## 2023-06-26 DIAGNOSIS — I110 Hypertensive heart disease with heart failure: Secondary | ICD-10-CM | POA: Diagnosis present

## 2023-06-26 DIAGNOSIS — G9340 Encephalopathy, unspecified: Secondary | ICD-10-CM | POA: Diagnosis not present

## 2023-06-26 HISTORY — DX: Influenza due to unidentified influenza virus with other respiratory manifestations: J11.1

## 2023-06-26 LAB — CBC
HCT: 33.5 % — ABNORMAL LOW (ref 36.0–46.0)
Hemoglobin: 10.4 g/dL — ABNORMAL LOW (ref 12.0–15.0)
MCH: 30.2 pg (ref 26.0–34.0)
MCHC: 31 g/dL (ref 30.0–36.0)
MCV: 97.4 fL (ref 80.0–100.0)
Platelets: 306 10*3/uL (ref 150–400)
RBC: 3.44 MIL/uL — ABNORMAL LOW (ref 3.87–5.11)
RDW: 14.6 % (ref 11.5–15.5)
WBC: 6.9 10*3/uL (ref 4.0–10.5)
nRBC: 0.3 % — ABNORMAL HIGH (ref 0.0–0.2)

## 2023-06-26 LAB — BASIC METABOLIC PANEL
Anion gap: 13 (ref 5–15)
BUN: 9 mg/dL (ref 8–23)
CO2: 31 mmol/L (ref 22–32)
Calcium: 8.1 mg/dL — ABNORMAL LOW (ref 8.9–10.3)
Chloride: 99 mmol/L (ref 98–111)
Creatinine, Ser: 0.74 mg/dL (ref 0.44–1.00)
GFR, Estimated: >60 mL/min (ref >60)
Glucose, Bld: 96 mg/dL (ref 70–99)
Potassium: 3 mmol/L — ABNORMAL LOW (ref 3.5–5.1)
Sodium: 143 mmol/L (ref 135–145)

## 2023-06-26 LAB — GLUCOSE, CAPILLARY
Glucose-Capillary: 92 mg/dL (ref 70–99)
Glucose-Capillary: 193 mg/dL — ABNORMAL HIGH (ref 70–99)
Glucose-Capillary: 159 mg/dL — ABNORMAL HIGH (ref 70–99)

## 2023-06-26 LAB — MAGNESIUM: Magnesium: 1.6 mg/dL — ABNORMAL LOW (ref 1.7–2.4)

## 2023-06-26 LAB — PHOSPHORUS: Phosphorus: 2.3 mg/dL — ABNORMAL LOW (ref 2.5–4.6)

## 2023-06-26 MED ORDER — POTASSIUM CHLORIDE CRYS ER 20 MEQ PO TBCR
40.0000 meq | EXTENDED_RELEASE_TABLET | ORAL | Status: AC
  Administered 2023-06-26: 40 meq via ORAL
  Filled 2023-06-26: qty 2

## 2023-06-26 MED ORDER — SPIRONOLACTONE 50 MG PO TABS: 50.0000 mg | ORAL_TABLET | Freq: Every day | ORAL | Status: DC

## 2023-06-26 MED ORDER — MAGNESIUM SULFATE 2 GM/50ML IV SOLN
2.0000 g | Freq: Once | INTRAVENOUS | Status: AC
  Administered 2023-06-26: 2 g via INTRAVENOUS
  Filled 2023-06-26: qty 50

## 2023-06-26 MED ORDER — OXYCODONE HCL 5 MG PO TABS: 5.0000 mg | ORAL_TABLET | Freq: Four times a day (QID) | ORAL | 0 refills | Status: DC | PRN

## 2023-06-26 MED ORDER — POTASSIUM CHLORIDE 10 MEQ/100ML IV SOLN
10.0000 meq | INTRAVENOUS | Status: AC
  Administered 2023-06-26 (×3): 10 meq via INTRAVENOUS
  Filled 2023-06-26 (×3): qty 100

## 2023-06-26 MED ORDER — ACETAMINOPHEN 650 MG RE SUPP: 650.0000 mg | RECTAL | Status: DC | PRN

## 2023-06-26 MED ORDER — METOPROLOL TARTRATE 50 MG PO TABS: 50.0000 mg | ORAL_TABLET | Freq: Two times a day (BID) | ORAL | Status: DC

## 2023-06-26 MED ORDER — FUROSEMIDE 20 MG PO TABS: 60.0000 mg | ORAL_TABLET | Freq: Every day | ORAL | Status: DC

## 2023-06-26 MED ORDER — DOCUSATE SODIUM 100 MG PO CAPS: 200.0000 mg | ORAL_CAPSULE | Freq: Two times a day (BID) | ORAL | Status: DC

## 2023-06-26 MED ORDER — POLYETHYLENE GLYCOL 3350 17 G PO PACK: 17.0000 g | PACK | Freq: Two times a day (BID) | ORAL | Status: DC

## 2023-06-26 MED ORDER — POTASSIUM CHLORIDE ER 20 MEQ PO TBCR: EXTENDED_RELEASE_TABLET | ORAL | Status: DC

## 2023-06-26 MED ORDER — SIMETHICONE 80 MG PO CHEW: 80.0000 mg | CHEWABLE_TABLET | Freq: Four times a day (QID) | ORAL | Status: DC | PRN

## 2023-06-26 MED ORDER — MORPHINE SULFATE ER 15 MG PO TBCR: 15.0000 mg | EXTENDED_RELEASE_TABLET | Freq: Two times a day (BID) | ORAL | 0 refills | Status: DC

## 2023-06-26 MED ORDER — GABAPENTIN 600 MG PO TABS: 300.0000 mg | ORAL_TABLET | Freq: Two times a day (BID) | ORAL | Status: DC

## 2023-06-26 NOTE — Discharge Summary (Addendum)
 Physician Discharge Summary  [name] FMW:[id number] DOB: 08-15-1940 DOA: 06/13/2023  PCP: Mercer Clotilda SAUNDERS, MD  Admit date: 06/13/2023 Discharge date: 06/26/2023  Admitted From: (Home) Disposition:  (SNF)  Recommendations for Outpatient Follow-up:  Please keep encouraging incentive spirometry and flutter valve No recurrent ileus, please continue with current laxative regimen and as needed simethicone , only hold these meds in case she is having more than 3 BMs per day She is on 3 L nasal cannula at baseline Please repeat BMP in 3 days and replete potassium as needed, monitor frequently and adjust potassium supplement as needed   Diet recommendation: Heart Healthy   Brief/Interim Summary:  83 year old female with PMH significant for DMT2, HTN, chronic pain syndrome on morphine , RA on chronic steroids, ILD 3 L nasal cannula oxygen  chronically, bronchiectasis, chronic respiratory failure on O2, CKD 3b, chronic diastolic HF, PE/DVT on Eliquis , hypothyroidism presents to [facility] ED on 1/25 with altered mental status, for suggestive of sepsis due to RSV pneumonia she was admitted to ICU on pressors stabilized and transferred to receive care on 06/17/2023.  Hospital stay was prolonged due to development of ileus.  Septic shock secondary to flu A / RSV pneumonia a nd secondary to UTI (Klebsiella pneumonia and E. coli) -Possible concomitant bacterial infection but seems less likely. admitted to ICU required Levophed , now stabilized sepsis pathophysiology has resolved, finished her Tamiflu  course, and antibiotic course as well finishing her Tamiflu  course, also finishing 5 days of Rocephin , chronically on azithromycin  at baseline which will be continued.  Blood cultures with 2 different staph species in 2 different sets likely contamination.  Urine culture noted sensitive to Rocephin  which should suffice.  Overall much better, advance activity, PT OT, titrate down oxygen .  Encouraged to use I-S and  flutter valve for pulmonary toiletry.  Clinically improving.   Chronic hypoxic respiratory failure: ILD.  History of rheumatoid arthritis.  Chronically on 3 L nasal cannula oxygen . History of rheumatoid arthritis / Interstitial lung disease:   -She follows with Dr. Geronimo. Hold etanercept, plaquenil , leflunomide  in setting of viral infection.  As well she was on increased dose of steroids, much improved, respiratory status currently at baseline, so she is back to her home dose prednisone  10 mg oral daily, and resumed back on Plaquenil  and Arava  as discussed with Dr. Geronimo prior to discharge.    Acute encephalopathy due to severe sepsis:  -CT head nonacute, no headache or focal deficits.  With supportive care mentation has improved.  -As well polypharmacy can be contributing, she is on Flexeril , Cymbalta , gabapentin , MS IR, and MS Contin . -Gabapentin  dose was lowered, still has been discontinued. -She was kept on her home dose MS Contin , home dose MSIR was scheduled, so this has been changed to as needed oxycodone  during hospital stay and on discharge .    Abdominal distention/pain Distended small bowel/large bowel Recurrent ileus -Chronic abdominal pain with chronic high-dose narcotic use.   -Patient with significant abdominal distention, but overall exam is benign . -GI input greatly appreciated, appears to be improving on pyridostigmine  initially, so she finished 3 days of rivastigmine, no need to continue on discharge, abdominal exam has much improved, she denies any complaints, continue scheduled laxatives including MiraLAX  and Colace, only hold for diarrhea, continue with simethicone  as needed as well     Chronic diastolic CHF/ HTN:  -Last 2D echo in 2023 showed LVEF of 55% with grade 1 diastolic dysfunction, medications has been adjusted, continue with metoprolol  50 mg p.o. twice daily,  continue with home dose Lasix ,     AKI on CKD stage IIIa: Serum creatinine improved with IV  fluids. Shock Resolved.  Encourage oral intake, resuming diuretics on 06/18/2023,    History of DVT/PE: Currently not on AC. Telemetry monitoring   Chronic anemia: H&H remains stable.  Monitor H&H.  No signs of obvious bleeding.   Hypothyroidism: Continue levothyroxine .   Hypertension.  Occasions been adjusted, continue with metoprolol , started on low-dose Aldactone  in the setting of hypokalemia.      Hx of dementia: Aricept  resumed.   Hypokalemia, hypomagnesemia and hypophosphatemia.  Replaced.  History of significant supplements before discharge, continue with p.o. supplements at time of discharge.   Diabetes mellitus type II - SSI and CBG monitoring during hospital stay, Hb A1c 5.6.  Well-controlled.    Obesity class II Body mass index is 38.55 kg/m.   Discharge Diagnoses:  Principal Problem:   Septic shock (HCC) Active Problems:   ILD (interstitial lung disease) (HCC)   Chronic diastolic CHF (congestive heart failure) (HCC)   Acquired hypothyroidism   Acute encephalopathy   Ileus (HCC)   RSV (respiratory syncytial virus pneumonia)   Flu    Discharge Instructions  Discharge Instructions     Diet - low sodium heart healthy   Complete by: As directed    Discharge instructions   Complete by: As directed    Follow with Primary MD Mercer Clotilda SAUNDERS, MD/SNF physician  Get CBC, CMP, checked  by Primary MD next visit.    Activity: As tolerated with Full fall precautions use walker/cane & assistance as needed   Disposition snf   Diet: Heart Healthy   On your next visit with your primary care physician please Get Medicines reviewed and adjusted.   Please request your Prim.MD to go over all Hospital Tests and Procedure/Radiological results at the follow up, please get all Hospital records sent to your Prim MD by signing hospital release before you go home.   If you experience worsening of your admission symptoms, develop shortness of breath, life threatening  emergency, suicidal or homicidal thoughts you must seek medical attention immediately by calling 911 or calling your MD immediately  if symptoms less severe.  You Must read complete instructions/literature along with all the possible adverse reactions/side effects for all the Medicines you take and that have been prescribed to you. Take any new Medicines after you have completely understood and accpet all the possible adverse reactions/side effects.   Do not drive, operating heavy machinery, perform activities at heights, swimming or participation in water activities or provide baby sitting services if your were admitted for syncope or siezures until you have seen by Primary MD or a Neurologist and advised to do so again.  Do not drive when taking Pain medications.    Do not take more than prescribed Pain, Sleep and Anxiety Medications  Special Instructions: If you have smoked or chewed Tobacco  in the last 2 yrs please stop smoking, stop any regular Alcohol   and or any Recreational drug use.  Wear Seat belts while driving.   Please note  You were cared for by a hospitalist during your hospital stay. If you have any questions about your discharge medications or the care you received while you were in the hospital after you are discharged, you can call the unit and asked to speak with the hospitalist on call if the hospitalist that took care of you is not available. Once you are discharged, your primary care physician will  handle any further medical issues. Please note that NO REFILLS for any discharge medications will be authorized once you are discharged, as it is imperative that you return to your primary care physician (or establish a relationship with a primary care physician if you do not have one) for your aftercare needs so that they can reassess your need for medications and monitor your lab values.   Increase activity slowly   Complete by: As directed    No wound care   Complete by: As  directed       Allergies as of 06/26/2023       Reactions   Codeine Swelling, Other (See Comments)   Facial swelling, Chest pain, and swelling in legs   Infliximab Anaphylaxis   (REMICADE) sent me into shock   Lisinopril Swelling, Rash   Face and neck swelling   Ofev  [nintedanib] Diarrhea        Medication List     STOP taking these medications    acetaminophen  500 MG tablet Commonly known as: TYLENOL  Replaced by: acetaminophen  650 MG suppository   cyclobenzaprine  5 MG tablet Commonly known as: FLEXERIL    doxycycline  100 MG tablet Commonly known as: VIBRA -TABS   Enbrel SureClick 50 MG/ML injection Generic drug: etanercept   hydrALAZINE  25 MG tablet Commonly known as: APRESOLINE    linaclotide  72 MCG capsule Commonly known as: Linzess    metoprolol  succinate 100 MG 24 hr tablet Commonly known as: TOPROL -XL   metoprolol  succinate 25 MG 24 hr tablet Commonly known as: TOPROL -XL   olmesartan  40 MG tablet Commonly known as: BENICAR    Ozempic  (0.25 or 0.5 MG/DOSE) 2 MG/1.5ML Sopn Generic drug: Semaglutide (0.25 or 0.5MG /DOS)   senna 8.6 MG tablet Commonly known as: SENOKOT       TAKE these medications    acetaminophen  650 MG suppository Commonly known as: TYLENOL  Place 1 suppository (650 mg total) rectally every 4 (four) hours as needed for moderate pain (pain score 4-6) or fever. Replaces: acetaminophen  500 MG tablet   albuterol  108 (90 Base) MCG/ACT inhaler Commonly known as: VENTOLIN  HFA Inhale 2 puffs into the lungs as needed for wheezing or shortness of breath.   albuterol  (2.5 MG/3ML) 0.083% nebulizer solution Commonly known as: PROVENTIL  Take 3 mLs (2.5 mg total) by nebulization every 6 (six) hours as needed for wheezing.   allopurinol  300 MG tablet Commonly known as: ZYLOPRIM  Take 1 tablet (300 mg total) by mouth daily.   atorvastatin  10 MG tablet Commonly known as: LIPITOR Take 1 tablet (10 mg total) by mouth daily. What changed: when  to take this   azithromycin  250 MG tablet Commonly known as: ZITHROMAX  Take 1 tablet (250 mg total) by mouth every Monday, Wednesday, and Friday. Take every Monday, Wednesday, Friday.   BIOFREEZE ROLL-ON EX Apply 1 Application topically as needed (pain).   CALCIUM  + D3 PO Take 2 tablets by mouth daily.   colchicine  0.6 MG tablet Take 1 tablet (0.6 mg total) by mouth daily as needed (gout flares).   diclofenac  Sodium 1 % Gel Commonly known as: VOLTAREN  Apply 2 g topically 3 (three) times daily. Apply to both hands and knees What changed:  how much to take when to take this additional instructions   docusate sodium  100 MG capsule Commonly known as: COLACE Take 2 capsules (200 mg total) by mouth 2 (two) times daily. Hold for diarrhea.   donepezil  10 MG tablet Commonly known as: ARICEPT  Take 1 tablet (10 mg total) by mouth at bedtime.   DRY  EYES OP Place 2 drops into both eyes as needed (dry eyes).   DULoxetine  60 MG capsule Commonly known as: CYMBALTA  Take 1 capsule (60 mg total) by mouth daily.   fluticasone  50 MCG/ACT nasal spray Commonly known as: FLONASE  Place 1 spray into both nostrils daily. What changed:  how much to take when to take this reasons to take this   furosemide  20 MG tablet Commonly known as: LASIX  Take 3 tablets (60 mg total) by mouth daily. Start taking on: June 27, 2023 What changed:  medication strength how much to take   gabapentin  600 MG tablet Commonly known as: NEURONTIN  Take 0.5 tablets (300 mg total) by mouth 2 (two) times daily. What changed:  how much to take when to take this   guaiFENesin  600 MG 12 hr tablet Commonly known as: MUCINEX  Take 2 tablets (1,200 mg total) by mouth 2 (two) times daily.   hydroxychloroquine  200 MG tablet Commonly known as: PLAQUENIL  Take 1 tablet (200 mg total) by mouth daily.   leflunomide  20 MG tablet Commonly known as: ARAVA  Take 1 tablet (20 mg total) by mouth daily.   levothyroxine   50 MCG tablet Commonly known as: SYNTHROID  Take 1 tablet (50 mcg total) by mouth daily.   lidocaine  5 % Commonly known as: LIDODERM  Place 1 patch onto the skin daily. Remove & Discard patch within 12 hours or as directed by MD   metolazone  2.5 MG tablet Commonly known as: ZAROXOLYN  TAKE 1 TABLET BY MOUTH DAILY AS NEEDED FOR 2 DAYS FOR WEIGHT ABOVE 255 POUNDS   metoprolol  tartrate 50 MG tablet Commonly known as: LOPRESSOR  Take 1 tablet (50 mg total) by mouth 2 (two) times daily.   morphine  15 MG 12 hr tablet Commonly known as: MS CONTIN  Take 1 tablet (15 mg total) by mouth every 12 (twelve) hours. What changed: Another medication with the same name was removed. Continue taking this medication, and follow the directions you see here.   multivitamin with minerals Tabs tablet Take 1 tablet by mouth daily.   ondansetron  4 MG disintegrating tablet Commonly known as: ZOFRAN -ODT Take 1 tablet (4 mg total) by mouth every 8 (eight) hours as needed for nausea or vomiting.   oxyCODONE  5 MG immediate release tablet Commonly known as: Oxy IR/ROXICODONE  Take 1 tablet (5 mg total) by mouth every 6 (six) hours as needed for moderate pain (pain score 4-6).   OXYGEN  Inhale 3 L into the lungs See admin instructions. 3 L at bedtime, and 3 L during the day as needed for exertion   pantoprazole  40 MG tablet Commonly known as: PROTONIX  Take 1 tablet (40 mg total) by mouth 2 (two) times daily. What changed: additional instructions   polyethylene glycol 17 g packet Commonly known as: MIRALAX  / GLYCOLAX  Take 17 g by mouth 2 (two) times daily. Hold for diarrhea   Potassium Chloride  ER 20 MEQ Tbcr Please take 20 mg p.o. twice daily for first 3 days, then go back to 20 mg oral daily, monitor frequently and adjust as needed What changed:  how much to take how to take this when to take this additional instructions   predniSONE  10 MG tablet Commonly known as: DELTASONE  Take 1 tablet (10 mg  total) by mouth daily with breakfast.   SALONPAS EX Apply 1 patch topically as needed (shoulder and leg pain).   simethicone  80 MG chewable tablet Commonly known as: MYLICON Chew 1 tablet (80 mg total) by mouth every 6 (six) hours as needed for  flatulence.   spironolactone  50 MG tablet Commonly known as: ALDACTONE  Take 1 tablet (50 mg total) by mouth daily. Start taking on: June 27, 2023   Vitamin D3 50 MCG (2000 UT) capsule Take 2,000 Units by mouth daily.        Contact information for after-discharge care     Destination     [facility] .   Service: Skilled Nursing Contact information: [address] [place]  [address] [phone]                    Allergies  Allergen Reactions   Codeine Swelling and Other (See Comments)    Facial swelling, Chest pain, and swelling in legs    Infliximab Anaphylaxis    (REMICADE) sent me into shock    Lisinopril Swelling and Rash    Face and neck swelling    Ofev  [Nintedanib] Diarrhea    Consultations: PCCM Gastroenterology   Procedures/Studies: DG Abd Portable 1V Result Date: 06/26/2023 CLINICAL DATA:  Follow-up ileus EXAM: PORTABLE ABDOMEN - 1 VIEW COMPARISON:  06/24/2023 FINDINGS: Scattered large and small bowel gas is noted. The degree of colonic distention has improved when compared with the prior exam. No free air is seen. No abnormal mass is noted. Postsurgical changes in the left hip are again seen. IMPRESSION: Decrease in the degree of gaseous distension of the colon. No free air is seen. Electronically Signed   By: Oneil Devonshire M.D.   On: 06/26/2023 11:33   DG Abd 1 View Result Date: 06/24/2023 CLINICAL DATA:  Ileus. EXAM: ABDOMEN - 1 VIEW COMPARISON:  Abdominal radiograph dated 06/23/2023. FINDINGS: Dilated small and large bowel as seen on the prior radiograph. No free air identified. Degenerative changes of the spine. No acute osseous pathology. IMPRESSION: Persistent  air distention of the small and large bowel. Electronically Signed   By: Vanetta Chou M.D.   On: 06/24/2023 14:18   DG Abd Portable 1V Result Date: 06/23/2023 CLINICAL DATA:  83 year old female is constipated. EXAM: PORTABLE ABDOMEN - 1 VIEW COMPARISON:  06/22/2023 radiographs and earlier. FINDINGS: Portable AP supine view at 0752 hours. Gas Phil large and small bowel loops throughout the abdomen and pelvis redemonstrated, similar to multiple abdominal radiographs since 06/17/2023. No significant retained stool. Lung bases appear negative. Stable visualized osseous structures. Previous left femur ORIF. IMPRESSION: Ongoing gas distended small and large bowel loops. Differential considerations are ileus versus distal colonic obstruction (less likely given rectal gas on 06/20/2023 comparison. No significant retained stool. Electronically Signed   By: VEAR Hurst M.D.   On: 06/23/2023 08:45   DG Abd Portable 1V Result Date: 06/22/2023 CLINICAL DATA:  Constipation EXAM: PORTABLE ABDOMEN - 1 VIEW COMPARISON:  Two days ago FINDINGS: Generalized gaseous distension of colon and to a lesser extent affecting small bowel. No abnormal stool retention. No concerning mass effect or gas collection. The largest segment of gas dilated bowel is measured at the cecum, 11 cm and previously 9.4 cm. No clear volvulus pattern. IMPRESSION: Unchanged pattern of generalized gaseous distension of bowel. Greatest distension at the cecum where there is 11 cm in diameter, mildly progressed. Provided history of constipation, which is not seen radiographically. Electronically Signed   By: Dorn Roulette M.D.   On: 06/22/2023 10:37   DG Abd 1 View Result Date: 06/20/2023 CLINICAL DATA:  Abdominal pain EXAM: ABDOMEN - 1 VIEW COMPARISON:  Two days ago FINDINGS: Diffuse gaseous distension of colon measuring up to 9.5 cm in  diameter proximally. No abnormal stool retention or visible pneumatosis. No evidence of small-bowel obstruction.  Reticulation at the lung bases is stable. IMPRESSION: Unchanged diffuse gaseous distension of the colon suggesting adynamic ileus. Electronically Signed   By: Dorn Roulette M.D.   On: 06/20/2023 06:36   DG Abd Portable 1V Result Date: 06/17/2023 CLINICAL DATA:  Shortness of breath and abdominal pain EXAM: PORTABLE ABDOMEN - 1 VIEW COMPARISON:  12/15/2022 FINDINGS: Diffuse gaseous distension of colon measuring at least 7.5 cm at the distal transverse segment, proximal colon possibly measuring up to 10 cm in diameter. No evidence of pneumatosis or small bowel obstruction. No concerning intra-abdominal mass effect or calcification. IMPRESSION: Gas distended colon, usually ileus. Electronically Signed   By: Dorn Roulette M.D.   On: 06/17/2023 07:00   DG Chest Port 1 View Result Date: 06/17/2023 CLINICAL DATA:  Shortness of breath EXAM: PORTABLE CHEST 1 VIEW COMPARISON:  Four days ago FINDINGS: Indistinct density in the lower lungs which is stable. There is chronic lung disease by prior chest CT at the bases. Heart size is stable when allowing for significant distortion from rotation. Trace pleural fluid is possible on the right. No pneumothorax. IMPRESSION: Stable bilateral pulmonary opacity at least partially related to chronic lung disease by prior chest CT, with possible superimposed pneumonia. Electronically Signed   By: Dorn Roulette M.D.   On: 06/17/2023 06:58   ECHOCARDIOGRAM COMPLETE Result Date: 06/15/2023    ECHOCARDIOGRAM REPORT   Patient Name:   AALAYA YADAO Date of Exam: 06/15/2023 Medical Rec #:  [id number]         Height:       64.0 in Accession #:    [id number]        Weight:       234.6 lb Date of Birth:  05-27-1940         BSA:          2.094 m Patient Age:    82 years          BP:           161/86 mmHg Patient Gender: F                 HR:           88 bpm. Exam Location:  Inpatient Procedure: 2D Echo, Cardiac Doppler and Color Doppler Indications:    Shock R57.9  History:         Patient has prior history of Echocardiogram examinations, most                 recent 11/06/2021. Risk Factors:Hypertension, Diabetes,                 Dyslipidemia and Non-Smoker.  Sonographer:    Tillman Nora RVT RCS Referring Phys: [id number] MANCEL CHRISTELLA PLY  Sonographer Comments: Image acquisition challenging due to uncooperative patient. Patient asked for exam to end. Patient unable to lie still. IMPRESSIONS  1. Left ventricular ejection fraction, by estimation, is 60 to 65%. The left ventricle has normal function. The left ventricle has no regional wall motion abnormalities. Left ventricular diastolic parameters are consistent with Grade I diastolic dysfunction (impaired relaxation).  2. Right ventricular systolic function is normal. The right ventricular size is normal. Tricuspid regurgitation signal is inadequate for assessing PA pressure.  3. The mitral valve is normal in structure. No evidence of mitral valve regurgitation. No evidence of mitral stenosis.  4. The aortic valve is tricuspid. There is mild  calcification of the aortic valve. Aortic valve regurgitation is not visualized. No aortic stenosis is present.  5. IVC not visualized.  6. Exam truncated due to patient noncompliance. FINDINGS  Left Ventricle: Left ventricular ejection fraction, by estimation, is 60 to 65%. The left ventricle has normal function. The left ventricle has no regional wall motion abnormalities. The left ventricular internal cavity size was normal in size. There is  no left ventricular hypertrophy. Left ventricular diastolic parameters are consistent with Grade I diastolic dysfunction (impaired relaxation). Right Ventricle: The right ventricular size is normal. No increase in right ventricular wall thickness. Right ventricular systolic function is normal. Tricuspid regurgitation signal is inadequate for assessing PA pressure. Left Atrium: Left atrial size was normal in size. Right Atrium: Right atrial size was normal in size.  Pericardium: There is no evidence of pericardial effusion. Mitral Valve: The mitral valve is normal in structure. No evidence of mitral valve regurgitation. No evidence of mitral valve stenosis. Tricuspid Valve: The tricuspid valve is normal in structure. Tricuspid valve regurgitation is not demonstrated. Aortic Valve: The aortic valve is tricuspid. There is mild calcification of the aortic valve. Aortic valve regurgitation is not visualized. No aortic stenosis is present. Aortic valve mean gradient measures 5.0 mmHg. Aortic valve peak gradient measures 9.1 mmHg. Aortic valve area, by VTI measures 1.81 cm. Pulmonic Valve: The pulmonic valve was normal in structure. Pulmonic valve regurgitation is not visualized. Aorta: The aortic root is normal in size and structure. Venous: The inferior vena cava was not well visualized. IAS/Shunts: No atrial level shunt detected by color flow Doppler.  LEFT VENTRICLE PLAX 2D LVIDd:         4.20 cm   Diastology LVIDs:         2.70 cm   LV e' medial:    5.51 cm/s LV PW:         1.20 cm   LV E/e' medial:  14.1 LV IVS:        0.90 cm   LV e' lateral:   6.57 cm/s LVOT diam:     1.70 cm   LV E/e' lateral: 11.9 LV SV:         47 LV SV Index:   23 LVOT Area:     2.27 cm  RIGHT VENTRICLE RV S prime:     14.50 cm/s LEFT ATRIUM             Index        RIGHT ATRIUM          Index LA diam:        2.70 cm 1.29 cm/m   RA Area:     9.72 cm LA Vol (A2C):   40.9 ml 19.54 ml/m  RA Volume:   21.60 ml 10.32 ml/m LA Vol (A4C):   31.3 ml 14.93 ml/m LA Biplane Vol: 42.8 ml 20.44 ml/m  AORTIC VALVE AV Area (Vmax):    2.04 cm AV Area (Vmean):   1.87 cm AV Area (VTI):     1.81 cm AV Vmax:           151.00 cm/s AV Vmean:          102.000 cm/s AV VTI:            0.261 m AV Peak Grad:      9.1 mmHg AV Mean Grad:      5.0 mmHg LVOT Vmax:         136.00 cm/s LVOT Vmean:  83.900 cm/s LVOT VTI:          0.208 m LVOT/AV VTI ratio: 0.80  AORTA Ao Root diam: 2.90 cm Ao Asc diam:  3.40 cm MITRAL  VALVE MV Area (PHT): 5.54 cm    SHUNTS MV Decel Time: 137 msec    Systemic VTI:  0.21 m MV E velocity: 77.90 cm/s  Systemic Diam: 1.70 cm MV A velocity: 81.80 cm/s MV E/A ratio:  0.95 Dalton McleanMD Electronically signed by Ezra Kanner Signature Date/Time: 06/15/2023/10:38:49 AM    Final    DG Chest 1 View Result Date: 06/13/2023 CLINICAL DATA:  Dyspnea EXAM: CHEST  1 VIEW COMPARISON:  Chest x-ray 06/13/2023 FINDINGS: There are patchy airspace opacities in the right mid and upper lung as well as in the left lung base. There is no pleural effusion or pneumothorax. The cardiomediastinal silhouette is enlarged unchanged. Left-sided central venous catheter tip projects over the brachiocephalic SVC junction. No acute osseous abnormality. IMPRESSION: Patchy airspace opacities in the right mid and upper lung as well as in the left lung base, concerning for multifocal pneumonia. Electronically Signed   By: Greig Pique M.D.   On: 06/13/2023 23:00   CT Head Wo Contrast Result Date: 06/13/2023 CLINICAL DATA:  Mental status change, unknown cause. Altered mental status. Septic. EXAM: CT HEAD WITHOUT CONTRAST TECHNIQUE: Contiguous axial images were obtained from the base of the skull through the vertex without intravenous contrast. RADIATION DOSE REDUCTION: This exam was performed according to the departmental dose-optimization program which includes automated exposure control, adjustment of the mA and/or kV according to patient size and/or use of iterative reconstruction technique. COMPARISON:  CT head without contrast 02/02/2022 FINDINGS: Brain: Mild atrophy and moderate white matter changes are stable. The ventricles are of normal size. Deep brain nuclei are within normal limits. No significant extraaxial fluid collection is present. The brainstem and cerebellum are within normal limits. Midline structures are within normal limits. Vascular: No hyperdense vessel or unexpected calcification. Skull: Calvarium is  intact. No focal lytic or blastic lesions are present. No significant extracranial soft tissue lesion is present. Sinuses/Orbits: Extensive mucosal thickening and fluid is present in the right maxillary sinus. Mucosal thickening is present within right anterior ethmoid air cells. The paranasal sinuses and mastoid air cells are otherwise clear. Bilateral lens replacements are noted. Globes and orbits are otherwise unremarkable. A right lens replacement is present. Globes and orbits are otherwise within normal limits. IMPRESSION: 1. No acute intracranial abnormality or significant interval change. 2. Stable atrophy and moderate white matter disease. This likely reflects the sequela of chronic microvascular ischemia. 3. Right maxillary and ethmoid sinus disease. Electronically Signed   By: Lonni Necessary M.D.   On: 06/13/2023 19:21   DG Chest Port 1 View Result Date: 06/13/2023 CLINICAL DATA:  Drug overdose and lethargy EXAM: PORTABLE CHEST 1 VIEW COMPARISON:  Chest radiograph dated 02/02/2022 FINDINGS: Patient is rotated to the right. Mildly low lung volumes. Diffuse patchy left lower lung opacity. Minimal right basilar linear opacity. No focal consolidations. No pleural effusion or pneumothorax. Enlarged cardiomediastinal silhouette is likely projectional. No acute osseous abnormality. IMPRESSION: 1. Diffuse patchy left lower lung opacity, suspicious for aspiration. 2. Minimal right basilar linear opacity, likely atelectasis. Electronically Signed   By: Limin  Xu M.D.   On: 06/13/2023 18:55   (Echo, Carotid, EGD, Colonoscopy, ERCP)    Subjective: No significant events overnight, she denies any complaints today, had a good night sleep, had 3 BMs yesterday, denies any GI complaints  Discharge Exam: Vitals:   06/26/23 1000 06/26/23 1100  BP:    Pulse: 90 93  Resp: 12 13  Temp:    SpO2: 97% 96%   Vitals:   06/26/23 0800 06/26/23 0900 06/26/23 1000 06/26/23 1100  BP:      Pulse: (!) 105 (!)  208 90 93  Resp: 16 (!) 26 12 13   Temp:      TempSrc:      SpO2: 95% 94% 97% 96%  Weight:      Height:        General: Pt is alert, awake, not in acute distress, frail Cardiovascular: RRR, S1/S2 +, no rubs, no gallops Respiratory: CTA bilaterally, no wheezing, no rhonchi Abdominal: Soft, NT, ND, bowel sounds + Extremities: no edema, no cyanosis    The results of significant diagnostics from this hospitalization (including imaging, microbiology, ancillary and laboratory) are listed below for reference.     Microbiology: No results found for this or any previous visit (from the past 240 hours).   Labs: BNP (last 3 results) Recent Labs    06/19/23 0535 06/20/23 0531 06/21/23 0843  BNP 33.1 27.4 16.4   Basic Metabolic Panel: Recent Labs  Lab 06/20/23 0531 06/21/23 0843 06/22/23 0440 06/24/23 1239 06/25/23 0904 06/26/23 0527  NA 139 138 138 139 142 143  K 4.0 3.2* 3.8 3.1* 3.2* 3.0*  CL 99 98 100 98 104 99  CO2 23 28 28 29 26 31   GLUCOSE 82 98 100* 143* 98 96  BUN 9 10 8 11 11 9   CREATININE 0.76 0.69 0.61 0.76 0.90 0.74  CALCIUM  7.7* 7.9* 8.0* 8.2* 8.0* 8.1*  MG 1.8 1.6*  --  1.8 1.7 1.6*  PHOS 2.4* 2.1*  --  2.1* 2.6 2.3*   Liver Function Tests: Recent Labs  Lab 06/20/23 0531 06/21/23 0843 06/22/23 0440  AST 41 23 20  ALT 26 26 24   ALKPHOS 52 47 49  BILITOT 1.1 0.6 0.4  PROT 5.0* 4.9* 5.2*  ALBUMIN  2.4* 2.4* 2.5*   Recent Labs  Lab 06/20/23 0531  LIPASE 44   No results for input(s): AMMONIA in the last 168 hours. CBC: Recent Labs  Lab 06/20/23 0531 06/21/23 0843 06/22/23 0440 06/24/23 1239 06/25/23 0904 06/26/23 0527  WBC 8.0 9.1 8.0 9.1 7.3 6.9  NEUTROABS 4.4 5.4 5.3  --   --   --   HGB 10.5* 10.3* 11.1* 10.9* 10.1* 10.4*  HCT 33.8* 33.4* 35.8* 34.9* 32.4* 33.5*  MCV 95.5 96.0 97.3 95.9 97.0 97.4  PLT 237 260 281 322 305 306   Cardiac Enzymes: No results for input(s): CKTOTAL, CKMB, CKMBINDEX, TROPONINI in the last 168  hours. BNP: Invalid input(s): POCBNP CBG: Recent Labs  Lab 06/25/23 1104 06/25/23 1525 06/25/23 1740 06/25/23 2137 06/26/23 0611  GLUCAP 90 159* 173* 135* 92   D-Dimer No results for input(s): DDIMER in the last 72 hours. Hgb A1c No results for input(s): HGBA1C in the last 72 hours. Lipid Profile No results for input(s): CHOL, HDL, LDLCALC, TRIG, CHOLHDL, LDLDIRECT in the last 72 hours. Thyroid  function studies No results for input(s): TSH, T4TOTAL, T3FREE, THYROIDAB in the last 72 hours.  Invalid input(s): FREET3 Anemia work up No results for input(s): VITAMINB12, FOLATE, FERRITIN, TIBC, IRON , RETICCTPCT in the last 72 hours. Urinalysis    Component Value Date/Time   COLORURINE AMBER (A) 06/13/2023 1714   APPEARANCEUR CLOUDY (A) 06/13/2023 1714   LABSPEC 1.024 06/13/2023 1714   PHURINE 5.0 06/13/2023 1714  GLUCOSEU NEGATIVE 06/13/2023 1714   HGBUR NEGATIVE 06/13/2023 1714   BILIRUBINUR MODERATE (A) 06/13/2023 1714   BILIRUBINUR 1+ 01/30/2021 1450   KETONESUR NEGATIVE 06/13/2023 1714   PROTEINUR 100 (A) 06/13/2023 1714   UROBILINOGEN negative (A) 01/30/2021 1450   UROBILINOGEN 0.2 11/28/2019 1424   NITRITE NEGATIVE 06/13/2023 1714   LEUKOCYTESUR SMALL (A) 06/13/2023 1714   Sepsis Labs Recent Labs  Lab 06/22/23 0440 06/24/23 1239 06/25/23 0904 06/26/23 0527  WBC 8.0 9.1 7.3 6.9   Microbiology No results found for this or any previous visit (from the past 240 hours).   Time coordinating discharge: Over 30 minutes  SIGNED:   Brayton Lye, MD  [facility] 06/26/2023, 11:56 AM Pager   If 7PM-7AM, please contact night-coverage [email or web address]

## 2023-06-26 NOTE — Plan of Care (Signed)

## 2023-06-26 NOTE — TOC Transition Note (Signed)
 Transition of Care [facility]) - Discharge Note   Patient Details  Name: [name] MRN: [id number] Date of Birth: 06/07/1940  [facility]) CM/SW Contact:  Inocente GORMAN Kindle, LCSW Phone Number: 06/26/2023, 2:33 PM   Clinical Narrative:    Patient will DC to: [facility]  Anticipated DC date: 06/26/23 Family notified: Daughter, Product Manager by: ROME   Per MD patient ready for DC to [facility]. RN to call report prior to discharge [phone] room 4B). RN, patient, patient's family, and facility notified of DC. Discharge Summary and FL2 sent to facility. DC packet on chart including signed script. Ambulance transport requested for patient.   CSW will sign off for now as social work intervention is no longer needed. Please consult us  again if new needs arise.       Barriers to Discharge: Continued Medical Work up   Patient Goals and CMS Choice            Discharge Placement                       Discharge Plan and Services Additional resources added to the After Visit Summary for   In-house Referral: Clinical Social Work                                   Social Drivers of Health (SDOH) Interventions SDOH Screenings   Food Insecurity: No Food Insecurity (06/16/2023)  Housing: Low Risk  (06/16/2023)  Transportation Needs: No Transportation Needs (06/16/2023)  Utilities: Not At Risk (06/16/2023)  Alcohol  Screen: Low Risk  (10/27/2022)  Depression (PHQ2-9): High Risk (06/12/2023)  Financial Resource Strain: Low Risk  (06/11/2023)  Physical Activity: Inactive (06/11/2023)  Social Connections: Socially Integrated (06/16/2023)  Stress: No Stress Concern Present (10/27/2022)  Tobacco Use: Low Risk  (06/17/2023)     Readmission Risk Interventions    12/16/2022   11:09 AM  Readmission Risk Prevention Plan  Transportation Screening Complete  PCP or Specialist Appt within 5-7 Days Complete  Home Care Screening Complete  Medication  Review (RN CM) Complete

## 2023-06-26 NOTE — TOC Progression Note (Signed)
 Transition of Care [facility]) - Progression Note    Patient Details  Name: [name] MRN: [id number] Date of Birth: 01/09/1941  Transition of Care [facility] CM/SW Contact  Inocente GORMAN Kindle, LCSW Phone Number: 06/26/2023, 8:55 AM  Clinical Narrative:    CSW received call from patient's daughter, Brad [phone]). Family has selected [facility] and is aware it will be a semi-private room until a private becomes available. Nina requests [facility] for transport.     Expected Discharge Plan: Skilled Nursing Facility Barriers to Discharge: Continued Medical Work up  Expected Discharge Plan and Services In-house Referral: Clinical Social Work     Living arrangements for the past 2 months: Single Family Home                                       Social Determinants of Health (SDOH) Interventions SDOH Screenings   Food Insecurity: No Food Insecurity (06/16/2023)  Housing: Low Risk  (06/16/2023)  Transportation Needs: No Transportation Needs (06/16/2023)  Utilities: Not At Risk (06/16/2023)  Alcohol  Screen: Low Risk  (10/27/2022)  Depression (PHQ2-9): High Risk (06/12/2023)  Financial Resource Strain: Low Risk  (06/11/2023)  Physical Activity: Inactive (06/11/2023)  Social Connections: Socially Integrated (06/16/2023)  Stress: No Stress Concern Present (10/27/2022)  Tobacco Use: Low Risk  (06/17/2023)    Readmission Risk Interventions    12/16/2022   11:09 AM  Readmission Risk Prevention Plan  Transportation Screening Complete  PCP or Specialist Appt within 5-7 Days Complete  Home Care Screening Complete  Medication Review (RN CM) Complete

## 2023-06-26 NOTE — Telephone Encounter (Signed)
 First opening with NP is 07/29/2023. Is this okay or are you willing to see this patient in 15 minutes? You have a 15 minute opening on 07/01/2023

## 2023-06-26 NOTE — Discharge Instructions (Signed)
 Follow with Primary MD Mercer Clotilda SAUNDERS, MD/SNF physician  Get CBC, CMP, checked  by Primary MD next visit.    Activity: As tolerated with Full fall precautions use walker/cane & assistance as needed   Disposition snf   Diet: Heart Healthy   On your next visit with your primary care physician please Get Medicines reviewed and adjusted.   Please request your Prim.MD to go over all Hospital Tests and Procedure/Radiological results at the follow up, please get all Hospital records sent to your Prim MD by signing hospital release before you go home.   If you experience worsening of your admission symptoms, develop shortness of breath, life threatening emergency, suicidal or homicidal thoughts you must seek medical attention immediately by calling 911 or calling your MD immediately  if symptoms less severe.  You Must read complete instructions/literature along with all the possible adverse reactions/side effects for all the Medicines you take and that have been prescribed to you. Take any new Medicines after you have completely understood and accpet all the possible adverse reactions/side effects.   Do not drive, operating heavy machinery, perform activities at heights, swimming or participation in water activities or provide baby sitting services if your were admitted for syncope or siezures until you have seen by Primary MD or a Neurologist and advised to do so again.  Do not drive when taking Pain medications.    Do not take more than prescribed Pain, Sleep and Anxiety Medications  Special Instructions: If you have smoked or chewed Tobacco  in the last 2 yrs please stop smoking, stop any regular Alcohol   and or any Recreational drug use.  Wear Seat belts while driving.   Please note  You were cared for by a hospitalist during your hospital stay. If you have any questions about your discharge medications or the care you received while you were in the hospital after you are  discharged, you can call the unit and asked to speak with the hospitalist on call if the hospitalist that took care of you is not available. Once you are discharged, your primary care physician will handle any further medical issues. Please note that NO REFILLS for any discharge medications will be authorized once you are discharged, as it is imperative that you return to your primary care physician (or establish a relationship with a primary care physician if you do not have one) for your aftercare needs so that they can reassess your need for medications and monitor your lab values.

## 2023-06-26 NOTE — Progress Notes (Addendum)
 Daily Progress Note  DOA: 06/13/2023 Hospital Day: 14   Chief Complaint:  ileus  ASSESSMENT    Brief Narrative:  [name] is a 83 y.o. 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 Admitted 1/25 with RSV PNA / sepsis requiring pressors. GI consulted 2/4 for abdominal pain / generalized ileus     Prolonged generalized ileus  Suspect combination of recent severe illness, hypokalemia, narcotics, decreased mobility ( usually ambulates at home).  Today;  Not as much abdominal discomfort today. Having BMs.    Hypokalemia, persistent K+ 3.0.    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 Surgery evaluated Dec 2024 and felt best to treat symptomatically as she was a high risk surgical candidate.     Chronic constipation.  Takes Linzess  and senna at home. Getting colace and BID Miralax  inpatient. Skipping doses of miralax  but still having daily BMs   Principal Problem:   Septic shock (HCC) Active Problems:   Ileus (HCC)   PLAN   --For discharge home --Continue Miralax , can take only once daily if having daily BMs, otherwise needs it BID and can continue Colace. Another option is  she can resume her home Linzess  and Senna. Either option is okay as long as it works and having a daily BM. --Will d/c Mestinon  since being discharged today.  --Needs ongoing K + replacement  Subjective   Had bad generalized abdominal pain again this am. Says it occurred after taking one of her medications.She feels the improvement in abdominal distention . Having  BMs   Objective    Recent Labs    06/24/23 1239 06/25/23 0904 06/26/23 0527  WBC 9.1 7.3 6.9  HGB 10.9* 10.1* 10.4*  HCT 34.9* 32.4* 33.5*  PLT 322 305 306   BMET Recent Labs    06/24/23 1239 06/25/23 0904 06/26/23 0527  NA 139 142 143  K 3.1* 3.2* 3.0*  CL 98 104 99  CO2 29 26 31   GLUCOSE 143* 98 96  BUN 11 11 9   CREATININE 0.76 0.90 0.74  CALCIUM  8.2* 8.0* 8.1*   LFT No results for input(s): PROT, ALBUMIN , AST, ALT, ALKPHOS, BILITOT, BILIDIR, IBILI in the last 72 hours. PT/INR No results for input(s): LABPROT, INR in the last 72 hours.   Imaging:  DG Abd 1 View CLINICAL DATA:  Ileus.  EXAM: ABDOMEN - 1 VIEW  COMPARISON:  Abdominal radiograph dated 06/23/2023.  FINDINGS: Dilated small and large bowel as seen on the prior radiograph. No free air identified. Degenerative changes of the spine. No acute osseous pathology.  IMPRESSION: Persistent air distention of the small and large bowel.  Electronically Signed   By: Vanetta Chou M.D.   On: 06/24/2023 14:18     Scheduled inpatient medications:   allopurinol   300 mg Oral Daily   bisacodyl   10 mg Rectal Once   Chlorhexidine  Gluconate Cloth  6 each Topical Daily   docusate sodium   200 mg Oral BID   donepezil   10 mg Oral QHS   famotidine   20 mg Oral QHS   feeding supplement  237 mL Oral BID BM   furosemide   60 mg Oral Daily   heparin   5,000 Units Subcutaneous Q8H   insulin  aspart  0-6 Units Subcutaneous TID WC   levothyroxine   50 mcg Oral Daily   metoprolol  tartrate  50 mg Oral BID   morphine   15 mg Oral Q12H   oseltamivir   75 mg Oral Once   pantoprazole   40 mg Oral Daily   polyethylene glycol  17 g Oral BID   potassium chloride   40 mEq Oral Q4H   predniSONE   20 mg Oral Q breakfast   pyridostigmine   60 mg Oral BID   spironolactone   50 mg Oral Daily   Continuous inpatient infusions:   potassium chloride  10 mEq (06/26/23 1107)   PRN inpatient medications:  acetaminophen  **OR** acetaminophen , fluticasone , hydrALAZINE , ipratropium-albuterol , naLOXone  (NARCAN )  injection, ondansetron  (ZOFRAN ) IV, mouth rinse, oxyCODONE , simethicone   Vital signs in last 24 hours: Temp:  [97.9 F (36.6 C)-99.1 F (37.3 C)] 97.9 F (36.6 C) (02/07 0400) Pulse Rate:  [106] 106 (02/06 2000) Resp:  [18-19] 19 (02/06 2000) BP: (121-137)/(75-83) 137/75 (02/07 0352) SpO2:  [93 %] 93 % (02/06 2000) Weight:  [98.7 kg] 98.7 kg (02/07 0418) Last BM Date : 06/25/23  Intake/Output Summary (Last 24 hours) at 06/26/2023 1109 Last data filed at 06/25/2023 1845 Gross per 24 hour  Intake --  Output 700 ml  Net -700 ml    Intake/Output from previous day: 02/06 0701 - 02/07 0700 In: -  Out: 700 [Urine:700] Intake/Output this shift: No intake/output data recorded.   Physical Exam:  General: Alert female in NAD Heart:  Regular rate and rhythm.  Pulmonary: Normal respiratory effort Abdomen: Soft, nondistended, nontender. Normal bowel sounds. Extremities: No lower extremity edema  Neurologic: Alert and oriented Psych: Pleasant. Cooperative.    LOS: 13 days   [name] ,NP 06/26/2023, 11:09 AM   I have taken history, reviewed the chart and examined the patient. I performed a substantive portion of this encounter, including complete performance of at least one of the key components, in conjunction with the APP. I agree with the Advanced Practitioner's note, impression and recommendations.    [name] reports feeling better today.  Abdominal pain is improved.  KUB documents decrease in gaseous distention of the colon without free air.  Calcium , magnesium  and phosphorus have been low and these are being repleted.  Exam she is resting quietly in bed no distress. Soft, nontender nondistended with normoactive bowel sounds.  As she is nearing discharge and ileus is resolving we will discontinue Mestinon .  She can continue bowel regimen in the outpatient setting as  outlined above.

## 2023-06-26 NOTE — Telephone Encounter (Signed)
 Routing to front desk for schedule.

## 2023-06-29 DIAGNOSIS — E119 Type 2 diabetes mellitus without complications: Secondary | ICD-10-CM | POA: Diagnosis not present

## 2023-06-29 DIAGNOSIS — F0390 Unspecified dementia without behavioral disturbance: Secondary | ICD-10-CM | POA: Diagnosis not present

## 2023-06-29 DIAGNOSIS — J9601 Acute respiratory failure with hypoxia: Secondary | ICD-10-CM | POA: Diagnosis not present

## 2023-06-29 DIAGNOSIS — E785 Hyperlipidemia, unspecified: Secondary | ICD-10-CM | POA: Diagnosis not present

## 2023-06-29 DIAGNOSIS — E039 Hypothyroidism, unspecified: Secondary | ICD-10-CM | POA: Diagnosis not present

## 2023-06-29 DIAGNOSIS — G9340 Encephalopathy, unspecified: Secondary | ICD-10-CM | POA: Diagnosis not present

## 2023-06-29 DIAGNOSIS — K5900 Constipation, unspecified: Secondary | ICD-10-CM | POA: Diagnosis not present

## 2023-06-29 DIAGNOSIS — N1831 Chronic kidney disease, stage 3a: Secondary | ICD-10-CM | POA: Diagnosis not present

## 2023-06-29 DIAGNOSIS — I5032 Chronic diastolic (congestive) heart failure: Secondary | ICD-10-CM | POA: Diagnosis not present

## 2023-06-30 ENCOUNTER — Emergency Department: Payer: Medicare Other

## 2023-06-30 ENCOUNTER — Other Ambulatory Visit: Payer: Self-pay

## 2023-06-30 ENCOUNTER — Inpatient Hospital Stay
Admission: EM | Admit: 2023-06-30 | Discharge: 2023-07-10 | DRG: 872 | Disposition: A | Discharge status: Skilled Nursing Facility | Payer: Medicare Other | Source: Ambulatory Visit | Attending: Internal Medicine | Admitting: Internal Medicine

## 2023-06-30 DIAGNOSIS — R197 Diarrhea, unspecified: Secondary | ICD-10-CM | POA: Diagnosis not present

## 2023-06-30 DIAGNOSIS — Z9981 Dependence on supplemental oxygen: Secondary | ICD-10-CM | POA: Diagnosis not present

## 2023-06-30 DIAGNOSIS — J8410 Pulmonary fibrosis, unspecified: Secondary | ICD-10-CM | POA: Diagnosis present

## 2023-06-30 DIAGNOSIS — R1031 Right lower quadrant pain: Secondary | ICD-10-CM | POA: Diagnosis not present

## 2023-06-30 DIAGNOSIS — R0989 Other specified symptoms and signs involving the circulatory and respiratory systems: Secondary | ICD-10-CM | POA: Diagnosis not present

## 2023-06-30 DIAGNOSIS — E860 Dehydration: Secondary | ICD-10-CM | POA: Diagnosis present

## 2023-06-30 DIAGNOSIS — E039 Hypothyroidism, unspecified: Secondary | ICD-10-CM | POA: Diagnosis not present

## 2023-06-30 DIAGNOSIS — I10 Essential (primary) hypertension: Secondary | ICD-10-CM | POA: Diagnosis not present

## 2023-06-30 DIAGNOSIS — Z741 Need for assistance with personal care: Secondary | ICD-10-CM | POA: Diagnosis not present

## 2023-06-30 DIAGNOSIS — E1140 Type 2 diabetes mellitus with diabetic neuropathy, unspecified: Secondary | ICD-10-CM | POA: Diagnosis present

## 2023-06-30 DIAGNOSIS — E785 Hyperlipidemia, unspecified: Secondary | ICD-10-CM | POA: Diagnosis present

## 2023-06-30 DIAGNOSIS — Z800 Family history of malignant neoplasm of digestive organs: Secondary | ICD-10-CM

## 2023-06-30 DIAGNOSIS — Z8249 Family history of ischemic heart disease and other diseases of the circulatory system: Secondary | ICD-10-CM

## 2023-06-30 DIAGNOSIS — Z86711 Personal history of pulmonary embolism: Secondary | ICD-10-CM

## 2023-06-30 DIAGNOSIS — Z801 Family history of malignant neoplasm of trachea, bronchus and lung: Secondary | ICD-10-CM

## 2023-06-30 DIAGNOSIS — Z7952 Long term (current) use of systemic steroids: Secondary | ICD-10-CM

## 2023-06-30 DIAGNOSIS — K219 Gastro-esophageal reflux disease without esophagitis: Secondary | ICD-10-CM | POA: Diagnosis not present

## 2023-06-30 DIAGNOSIS — G629 Polyneuropathy, unspecified: Secondary | ICD-10-CM | POA: Diagnosis not present

## 2023-06-30 DIAGNOSIS — Z833 Family history of diabetes mellitus: Secondary | ICD-10-CM

## 2023-06-30 DIAGNOSIS — G8929 Other chronic pain: Secondary | ICD-10-CM | POA: Diagnosis not present

## 2023-06-30 DIAGNOSIS — I2510 Atherosclerotic heart disease of native coronary artery without angina pectoris: Secondary | ICD-10-CM | POA: Diagnosis not present

## 2023-06-30 DIAGNOSIS — M109 Gout, unspecified: Secondary | ICD-10-CM | POA: Diagnosis present

## 2023-06-30 DIAGNOSIS — J4489 Other specified chronic obstructive pulmonary disease: Secondary | ICD-10-CM | POA: Diagnosis present

## 2023-06-30 DIAGNOSIS — E7800 Pure hypercholesterolemia, unspecified: Secondary | ICD-10-CM | POA: Diagnosis present

## 2023-06-30 DIAGNOSIS — R2681 Unsteadiness on feet: Secondary | ICD-10-CM | POA: Diagnosis not present

## 2023-06-30 DIAGNOSIS — J9611 Chronic respiratory failure with hypoxia: Secondary | ICD-10-CM | POA: Diagnosis present

## 2023-06-30 DIAGNOSIS — I739 Peripheral vascular disease, unspecified: Secondary | ICD-10-CM | POA: Diagnosis not present

## 2023-06-30 DIAGNOSIS — I444 Left anterior fascicular block: Secondary | ICD-10-CM | POA: Diagnosis present

## 2023-06-30 DIAGNOSIS — Z6835 Body mass index (BMI) 35.0-35.9, adult: Secondary | ICD-10-CM

## 2023-06-30 DIAGNOSIS — K567 Ileus, unspecified: Secondary | ICD-10-CM | POA: Diagnosis not present

## 2023-06-30 DIAGNOSIS — Z885 Allergy status to narcotic agent status: Secondary | ICD-10-CM

## 2023-06-30 DIAGNOSIS — Z8261 Family history of arthritis: Secondary | ICD-10-CM

## 2023-06-30 DIAGNOSIS — J84115 Respiratory bronchiolitis interstitial lung disease: Secondary | ICD-10-CM | POA: Diagnosis not present

## 2023-06-30 DIAGNOSIS — Z86011 Personal history of benign neoplasm of the brain: Secondary | ICD-10-CM

## 2023-06-30 DIAGNOSIS — Z8349 Family history of other endocrine, nutritional and metabolic diseases: Secondary | ICD-10-CM

## 2023-06-30 DIAGNOSIS — K56609 Unspecified intestinal obstruction, unspecified as to partial versus complete obstruction: Secondary | ICD-10-CM | POA: Diagnosis not present

## 2023-06-30 DIAGNOSIS — E66812 Obesity, class 2: Secondary | ICD-10-CM | POA: Diagnosis present

## 2023-06-30 DIAGNOSIS — E876 Hypokalemia: Secondary | ICD-10-CM | POA: Diagnosis not present

## 2023-06-30 DIAGNOSIS — N200 Calculus of kidney: Secondary | ICD-10-CM | POA: Diagnosis present

## 2023-06-30 DIAGNOSIS — I5032 Chronic diastolic (congestive) heart failure: Secondary | ICD-10-CM | POA: Diagnosis present

## 2023-06-30 DIAGNOSIS — M1A09X0 Idiopathic chronic gout, multiple sites, without tophus (tophi): Secondary | ICD-10-CM | POA: Diagnosis not present

## 2023-06-30 DIAGNOSIS — R935 Abnormal findings on diagnostic imaging of other abdominal regions, including retroperitoneum: Secondary | ICD-10-CM | POA: Diagnosis not present

## 2023-06-30 DIAGNOSIS — Z79890 Hormone replacement therapy (postmenopausal): Secondary | ICD-10-CM

## 2023-06-30 DIAGNOSIS — F419 Anxiety disorder, unspecified: Secondary | ICD-10-CM | POA: Diagnosis not present

## 2023-06-30 DIAGNOSIS — K56699 Other intestinal obstruction unspecified as to partial versus complete obstruction: Secondary | ICD-10-CM | POA: Diagnosis not present

## 2023-06-30 DIAGNOSIS — A419 Sepsis, unspecified organism: Secondary | ICD-10-CM | POA: Diagnosis not present

## 2023-06-30 DIAGNOSIS — D500 Iron deficiency anemia secondary to blood loss (chronic): Secondary | ICD-10-CM | POA: Diagnosis present

## 2023-06-30 DIAGNOSIS — R279 Unspecified lack of coordination: Secondary | ICD-10-CM | POA: Diagnosis not present

## 2023-06-30 DIAGNOSIS — M6281 Muscle weakness (generalized): Secondary | ICD-10-CM | POA: Diagnosis not present

## 2023-06-30 DIAGNOSIS — Z86718 Personal history of other venous thrombosis and embolism: Secondary | ICD-10-CM

## 2023-06-30 DIAGNOSIS — K5732 Diverticulitis of large intestine without perforation or abscess without bleeding: Secondary | ICD-10-CM | POA: Diagnosis present

## 2023-06-30 DIAGNOSIS — K529 Noninfective gastroenteritis and colitis, unspecified: Secondary | ICD-10-CM | POA: Diagnosis not present

## 2023-06-30 DIAGNOSIS — Z9181 History of falling: Secondary | ICD-10-CM

## 2023-06-30 DIAGNOSIS — J849 Interstitial pulmonary disease, unspecified: Secondary | ICD-10-CM | POA: Diagnosis present

## 2023-06-30 DIAGNOSIS — M6259 Muscle wasting and atrophy, not elsewhere classified, multiple sites: Secondary | ICD-10-CM | POA: Diagnosis not present

## 2023-06-30 DIAGNOSIS — J9811 Atelectasis: Secondary | ICD-10-CM | POA: Diagnosis present

## 2023-06-30 DIAGNOSIS — K5289 Other specified noninfective gastroenteritis and colitis: Secondary | ICD-10-CM | POA: Diagnosis not present

## 2023-06-30 DIAGNOSIS — Z8051 Family history of malignant neoplasm of kidney: Secondary | ICD-10-CM

## 2023-06-30 DIAGNOSIS — E119 Type 2 diabetes mellitus without complications: Secondary | ICD-10-CM | POA: Diagnosis not present

## 2023-06-30 DIAGNOSIS — R000 Tachycardia, unspecified: Secondary | ICD-10-CM | POA: Diagnosis not present

## 2023-06-30 DIAGNOSIS — G9340 Encephalopathy, unspecified: Secondary | ICD-10-CM | POA: Diagnosis not present

## 2023-06-30 DIAGNOSIS — I110 Hypertensive heart disease with heart failure: Secondary | ICD-10-CM | POA: Diagnosis present

## 2023-06-30 DIAGNOSIS — Z743 Need for continuous supervision: Secondary | ICD-10-CM | POA: Diagnosis not present

## 2023-06-30 DIAGNOSIS — Z8042 Family history of malignant neoplasm of prostate: Secondary | ICD-10-CM

## 2023-06-30 DIAGNOSIS — Z841 Family history of disorders of kidney and ureter: Secondary | ICD-10-CM

## 2023-06-30 DIAGNOSIS — Z8049 Family history of malignant neoplasm of other genital organs: Secondary | ICD-10-CM

## 2023-06-30 DIAGNOSIS — R278 Other lack of coordination: Secondary | ICD-10-CM | POA: Diagnosis not present

## 2023-06-30 DIAGNOSIS — M05741 Rheumatoid arthritis with rheumatoid factor of right hand without organ or systems involvement: Secondary | ICD-10-CM | POA: Diagnosis not present

## 2023-06-30 DIAGNOSIS — M069 Rheumatoid arthritis, unspecified: Secondary | ICD-10-CM | POA: Diagnosis present

## 2023-06-30 DIAGNOSIS — Z803 Family history of malignant neoplasm of breast: Secondary | ICD-10-CM

## 2023-06-30 DIAGNOSIS — H2513 Age-related nuclear cataract, bilateral: Secondary | ICD-10-CM | POA: Diagnosis not present

## 2023-06-30 DIAGNOSIS — Z7901 Long term (current) use of anticoagulants: Secondary | ICD-10-CM

## 2023-06-30 DIAGNOSIS — N281 Cyst of kidney, acquired: Secondary | ICD-10-CM | POA: Diagnosis not present

## 2023-06-30 DIAGNOSIS — K5730 Diverticulosis of large intestine without perforation or abscess without bleeding: Secondary | ICD-10-CM | POA: Diagnosis not present

## 2023-06-30 DIAGNOSIS — R918 Other nonspecific abnormal finding of lung field: Secondary | ICD-10-CM | POA: Diagnosis not present

## 2023-06-30 DIAGNOSIS — M05742 Rheumatoid arthritis with rheumatoid factor of left hand without organ or systems involvement: Secondary | ICD-10-CM | POA: Diagnosis not present

## 2023-06-30 DIAGNOSIS — G894 Chronic pain syndrome: Secondary | ICD-10-CM | POA: Diagnosis present

## 2023-06-30 DIAGNOSIS — D649 Anemia, unspecified: Secondary | ICD-10-CM | POA: Diagnosis not present

## 2023-06-30 DIAGNOSIS — Z1152 Encounter for screening for COVID-19: Secondary | ICD-10-CM

## 2023-06-30 DIAGNOSIS — G3184 Mild cognitive impairment, so stated: Secondary | ICD-10-CM | POA: Diagnosis not present

## 2023-06-30 DIAGNOSIS — E1169 Type 2 diabetes mellitus with other specified complication: Secondary | ICD-10-CM | POA: Diagnosis not present

## 2023-06-30 DIAGNOSIS — H04129 Dry eye syndrome of unspecified lacrimal gland: Secondary | ICD-10-CM | POA: Diagnosis not present

## 2023-06-30 DIAGNOSIS — K6389 Other specified diseases of intestine: Secondary | ICD-10-CM | POA: Diagnosis not present

## 2023-06-30 DIAGNOSIS — Z87892 Personal history of anaphylaxis: Secondary | ICD-10-CM

## 2023-06-30 DIAGNOSIS — Z90710 Acquired absence of both cervix and uterus: Secondary | ICD-10-CM

## 2023-06-30 DIAGNOSIS — M170 Bilateral primary osteoarthritis of knee: Secondary | ICD-10-CM | POA: Diagnosis not present

## 2023-06-30 DIAGNOSIS — Z888 Allergy status to other drugs, medicaments and biological substances status: Secondary | ICD-10-CM

## 2023-06-30 DIAGNOSIS — Z79899 Other long term (current) drug therapy: Secondary | ICD-10-CM

## 2023-06-30 HISTORY — DX: Atherosclerotic heart disease of native coronary artery without angina pectoris: I25.10

## 2023-06-30 LAB — COMPREHENSIVE METABOLIC PANEL
ALT: 19 U/L (ref 0–44)
AST: 18 U/L (ref 15–41)
Albumin: 3.5 g/dL (ref 3.5–5.0)
Alkaline Phosphatase: 54 U/L (ref 38–126)
Anion gap: 14 (ref 5–15)
BUN: 25 mg/dL — ABNORMAL HIGH (ref 8–23)
CO2: 30 mmol/L (ref 22–32)
Calcium: 8.7 mg/dL — ABNORMAL LOW (ref 8.9–10.3)
Chloride: 97 mmol/L — ABNORMAL LOW (ref 98–111)
Creatinine, Ser: 1.15 mg/dL — ABNORMAL HIGH (ref 0.44–1.00)
GFR, Estimated: 48 mL/min — ABNORMAL LOW (ref >60)
Glucose, Bld: 151 mg/dL — ABNORMAL HIGH (ref 70–99)
Potassium: 3.9 mmol/L (ref 3.5–5.1)
Sodium: 141 mmol/L (ref 135–145)
Total Bilirubin: 1 mg/dL (ref 0.0–1.2)
Total Protein: 6.5 g/dL (ref 6.5–8.1)

## 2023-06-30 LAB — GASTROINTESTINAL PANEL BY PCR, STOOL (REPLACES STOOL CULTURE)

## 2023-06-30 LAB — CBC WITH DIFFERENTIAL/PLATELET
Abs Immature Granulocytes: 0.09 10*3/uL — ABNORMAL HIGH (ref 0.00–0.07)
Basophils Absolute: 0.1 10*3/uL (ref 0.0–0.1)
Basophils Relative: 1 %
Eosinophils Absolute: 0.1 10*3/uL (ref 0.0–0.5)
Eosinophils Relative: 1 %
HCT: 44.2 % (ref 36.0–46.0)
Hemoglobin: 13.2 g/dL (ref 12.0–15.0)
Immature Granulocytes: 1 %
Lymphocytes Relative: 25 %
Lymphs Abs: 2.7 10*3/uL (ref 0.7–4.0)
MCH: 29.9 pg (ref 26.0–34.0)
MCHC: 29.9 g/dL — ABNORMAL LOW (ref 30.0–36.0)
MCV: 100.2 fL — ABNORMAL HIGH (ref 80.0–100.0)
Monocytes Absolute: 1.4 10*3/uL — ABNORMAL HIGH (ref 0.1–1.0)
Monocytes Relative: 12 %
Neutro Abs: 6.6 10*3/uL (ref 1.7–7.7)
Neutrophils Relative %: 60 %
Platelets: 310 10*3/uL (ref 150–400)
RBC: 4.41 MIL/uL (ref 3.87–5.11)
RDW: 15.4 % (ref 11.5–15.5)
WBC: 11 10*3/uL — ABNORMAL HIGH (ref 4.0–10.5)
nRBC: 0 % (ref 0.0–0.2)

## 2023-06-30 LAB — C DIFFICILE QUICK SCREEN W PCR REFLEX
C Diff antigen: NEGATIVE
C Diff interpretation: NOT DETECTED
C Diff toxin: NEGATIVE

## 2023-06-30 LAB — LACTIC ACID, PLASMA
Lactic Acid, Venous: 1.8 mmol/L (ref 0.5–1.9)
Lactic Acid, Venous: 1.6 mmol/L (ref 0.5–1.9)

## 2023-06-30 LAB — LIPASE, BLOOD: Lipase: 35 U/L (ref 11–51)

## 2023-06-30 LAB — OCCULT BLOOD X 1 CARD TO LAB, STOOL: Fecal Occult Bld: NEGATIVE

## 2023-06-30 MED ORDER — GUAIFENESIN ER 600 MG PO TB12
600.0000 mg | ORAL_TABLET | Freq: Two times a day (BID) | ORAL | Status: DC
  Administered 2023-06-30 – 2023-07-10 (×20): 600 mg via ORAL
  Filled 2023-06-30 (×20): qty 1

## 2023-06-30 MED ORDER — ACETAMINOPHEN 325 MG PO TABS
650.0000 mg | ORAL_TABLET | Freq: Four times a day (QID) | ORAL | Status: DC | PRN
  Administered 2023-07-01 – 2023-07-06 (×3): 650 mg via ORAL
  Filled 2023-06-30 (×3): qty 2

## 2023-06-30 MED ORDER — TRAZODONE HCL 50 MG PO TABS
25.0000 mg | ORAL_TABLET | Freq: Every evening | ORAL | Status: DC | PRN
  Filled 2023-06-30: qty 1

## 2023-06-30 MED ORDER — PANTOPRAZOLE SODIUM 40 MG IV SOLR
40.0000 mg | Freq: Once | INTRAVENOUS | Status: AC
  Administered 2023-06-30: 40 mg via INTRAVENOUS
  Filled 2023-06-30: qty 10

## 2023-06-30 MED ORDER — ALBUTEROL SULFATE (2.5 MG/3ML) 0.083% IN NEBU: 3.0000 mL | INHALATION_SOLUTION | RESPIRATORY_TRACT | Status: DC | PRN

## 2023-06-30 MED ORDER — DULOXETINE HCL 30 MG PO CPEP
60.0000 mg | ORAL_CAPSULE | Freq: Every day | ORAL | Status: DC
  Administered 2023-07-01 – 2023-07-10 (×10): 60 mg via ORAL
  Filled 2023-06-30 (×10): qty 2

## 2023-06-30 MED ORDER — PANTOPRAZOLE SODIUM 40 MG PO TBEC
40.0000 mg | DELAYED_RELEASE_TABLET | Freq: Two times a day (BID) | ORAL | Status: DC
Start: 2023-07-01 — End: 2023-07-03
  Administered 2023-07-01 – 2023-07-03 (×5): 40 mg via ORAL
  Filled 2023-06-30 (×5): qty 1

## 2023-06-30 MED ORDER — GUAIFENESIN 100 MG/5ML PO LIQD
15.0000 mL | Freq: Once | ORAL | Status: AC
  Administered 2023-06-30: 15 mL via ORAL
  Filled 2023-06-30: qty 20

## 2023-06-30 MED ORDER — ONDANSETRON HCL 4 MG/2ML IJ SOLN
4.0000 mg | Freq: Four times a day (QID) | INTRAMUSCULAR | Status: DC | PRN
Start: 2023-06-30 — End: 2023-07-10
  Administered 2023-07-02 – 2023-07-09 (×3): 4 mg via INTRAVENOUS
  Filled 2023-06-30 (×4): qty 2

## 2023-06-30 MED ORDER — SPIRONOLACTONE 25 MG PO TABS: 50.0000 mg | ORAL_TABLET | Freq: Every day | ORAL | Status: DC

## 2023-06-30 MED ORDER — MORPHINE SULFATE ER 15 MG PO TBCR
15.0000 mg | EXTENDED_RELEASE_TABLET | Freq: Two times a day (BID) | ORAL | Status: DC
  Administered 2023-07-01 – 2023-07-10 (×19): 15 mg via ORAL
  Filled 2023-06-30 (×20): qty 1

## 2023-06-30 MED ORDER — GABAPENTIN 300 MG PO CAPS
300.0000 mg | ORAL_CAPSULE | Freq: Two times a day (BID) | ORAL | Status: DC
  Administered 2023-07-01 – 2023-07-10 (×18): 300 mg via ORAL
  Filled 2023-06-30 (×19): qty 1

## 2023-06-30 MED ORDER — HYDROXYCHLOROQUINE SULFATE 200 MG PO TABS
200.0000 mg | ORAL_TABLET | Freq: Every day | ORAL | Status: DC
  Administered 2023-07-02 – 2023-07-10 (×9): 200 mg via ORAL
  Filled 2023-06-30 (×9): qty 1

## 2023-06-30 MED ORDER — LEFLUNOMIDE 10 MG PO TABS
20.0000 mg | ORAL_TABLET | Freq: Every day | ORAL | Status: DC
  Administered 2023-07-01 – 2023-07-10 (×10): 20 mg via ORAL
  Filled 2023-06-30 (×11): qty 2

## 2023-06-30 MED ORDER — METOLAZONE 2.5 MG PO TABS: 2.5000 mg | ORAL_TABLET | ORAL | Status: DC

## 2023-06-30 MED ORDER — METOPROLOL TARTRATE 50 MG PO TABS
50.0000 mg | ORAL_TABLET | Freq: Two times a day (BID) | ORAL | Status: DC
  Administered 2023-07-01 – 2023-07-02 (×3): 50 mg via ORAL
  Filled 2023-06-30 (×2): qty 1
  Filled 2023-06-30: qty 2

## 2023-06-30 MED ORDER — LACTATED RINGERS IV SOLN
150.0000 mL/h | INTRAVENOUS | Status: AC
  Administered 2023-06-30: 150 mL/h via INTRAVENOUS

## 2023-06-30 MED ORDER — ACETAMINOPHEN 650 MG RE SUPP: 650.0000 mg | Freq: Four times a day (QID) | RECTAL | Status: DC | PRN

## 2023-06-30 MED ORDER — OXYCODONE HCL 5 MG PO TABS
5.0000 mg | ORAL_TABLET | Freq: Four times a day (QID) | ORAL | Status: DC | PRN
  Administered 2023-07-01 – 2023-07-06 (×3): 5 mg via ORAL
  Filled 2023-06-30 (×3): qty 1

## 2023-06-30 MED ORDER — SODIUM CHLORIDE 0.9 % IV BOLUS
1000.0000 mL | Freq: Once | INTRAVENOUS | Status: AC
  Administered 2023-06-30: 1000 mL via INTRAVENOUS

## 2023-06-30 MED ORDER — FUROSEMIDE 40 MG PO TABS: 60.0000 mg | ORAL_TABLET | Freq: Every day | ORAL | Status: DC

## 2023-06-30 MED ORDER — ATORVASTATIN CALCIUM 20 MG PO TABS: 10.0000 mg | ORAL_TABLET | Freq: Every day | ORAL | Status: DC

## 2023-06-30 MED ORDER — LEVOTHYROXINE SODIUM 50 MCG PO TABS
50.0000 ug | ORAL_TABLET | Freq: Every day | ORAL | Status: DC
Start: 2023-07-02 — End: 2023-07-10
  Administered 2023-07-02 – 2023-07-10 (×9): 50 ug via ORAL
  Filled 2023-06-30 (×9): qty 1

## 2023-06-30 MED ORDER — ENOXAPARIN SODIUM 60 MG/0.6ML IJ SOSY
0.5000 mg/kg | PREFILLED_SYRINGE | INTRAMUSCULAR | Status: DC
  Administered 2023-07-01 – 2023-07-10 (×10): 45 mg via SUBCUTANEOUS
  Filled 2023-06-30 (×10): qty 0.6

## 2023-06-30 MED ORDER — ALLOPURINOL 300 MG PO TABS
300.0000 mg | ORAL_TABLET | Freq: Every day | ORAL | Status: DC
Start: 2023-07-01 — End: 2023-07-03
  Administered 2023-07-01 – 2023-07-03 (×3): 300 mg via ORAL
  Filled 2023-06-30 (×4): qty 1

## 2023-06-30 MED ORDER — DONEPEZIL HCL 5 MG PO TABS
10.0000 mg | ORAL_TABLET | Freq: Every day | ORAL | Status: DC
  Administered 2023-07-01 – 2023-07-09 (×9): 10 mg via ORAL
  Filled 2023-06-30 (×10): qty 2

## 2023-06-30 MED ORDER — FENTANYL CITRATE PF 50 MCG/ML IJ SOSY
50.0000 ug | PREFILLED_SYRINGE | Freq: Once | INTRAMUSCULAR | Status: AC
  Administered 2023-06-30: 50 ug via INTRAVENOUS
  Filled 2023-06-30: qty 1

## 2023-06-30 MED ORDER — ONDANSETRON HCL 4 MG/2ML IJ SOLN
4.0000 mg | Freq: Once | INTRAMUSCULAR | Status: AC
  Administered 2023-06-30: 4 mg via INTRAVENOUS
  Filled 2023-06-30: qty 2

## 2023-06-30 MED ORDER — SODIUM CHLORIDE 0.9 % IV SOLN
2.0000 g | INTRAVENOUS | Status: DC
  Administered 2023-06-30 – 2023-07-02 (×3): 2 g via INTRAVENOUS
  Filled 2023-06-30 (×3): qty 20

## 2023-06-30 MED ORDER — IOHEXOL 300 MG/ML  SOLN
100.0000 mL | Freq: Once | INTRAMUSCULAR | Status: AC | PRN
Start: 2023-06-30 — End: 2023-06-30
  Administered 2023-06-30: 100 mL via INTRAVENOUS

## 2023-06-30 MED ORDER — PIPERACILLIN-TAZOBACTAM 3.375 G IVPB 30 MIN
3.3750 g | Freq: Once | INTRAVENOUS | Status: AC
  Administered 2023-06-30: 3.375 g via INTRAVENOUS
  Filled 2023-06-30: qty 50

## 2023-06-30 MED ORDER — ONDANSETRON HCL 4 MG PO TABS
4.0000 mg | ORAL_TABLET | Freq: Four times a day (QID) | ORAL | Status: DC | PRN
  Administered 2023-07-03 – 2023-07-09 (×5): 4 mg via ORAL
  Filled 2023-06-30 (×5): qty 1

## 2023-06-30 MED ORDER — METRONIDAZOLE 500 MG/100ML IV SOLN
500.0000 mg | Freq: Two times a day (BID) | INTRAVENOUS | Status: DC
  Administered 2023-06-30 – 2023-07-02 (×5): 500 mg via INTRAVENOUS
  Filled 2023-06-30 (×6): qty 100

## 2023-06-30 NOTE — ED Provider Notes (Signed)
[facility] Provider Note    Event Date/Time   First MD Initiated Contact with Patient 06/30/23 1650     (approximate)   History   Chief Complaint: Abdominal Pain   HPI  [name] is a 83 y.o. female with a history of hypertension, COPD, diabetes, CKD, CHF, morbid obesity, chronic decubitus wounds with rectal tube for hygiene who comes to the ED complaining of generalized abdominal pain, severe, gradual onset this morning and constant and worsening.  Reports decreased rectal tube stool output.  Also has nausea and a feeling of upper abdominal fullness like she may vomit.  She has had decreased appetite and oral intake for several days.  Denies chest pain or shortness of breath or fever.          Physical Exam   Triage Vital Signs: ED Triage Vitals  Encounter Vitals Group     BP 06/30/23 1531 (!) 131/92     Systolic BP Percentile --      Diastolic BP Percentile --      Pulse Rate 06/30/23 1531 (!) 132     Resp 06/30/23 1531 20     Temp 06/30/23 1531 97.9 F (36.6 C)     Temp Source 06/30/23 1531 Oral     SpO2 06/30/23 1531 95 %     Weight 06/30/23 1533 200 lb (90.7 kg)     Height 06/30/23 1533 5\' 3"  (1.6 m)     Head Circumference --      Peak Flow --      Pain Score 06/30/23 1529 7     Pain Loc --      Pain Education --      Exclude from Growth Chart --     Most recent vital signs: Vitals:   06/30/23 2015 06/30/23 2100  BP: (!) 159/92 114/77  Pulse: (!) 124 (!) 131  Resp: 16 16  Temp:    SpO2: 99% 96%    General: Awake, no distress.  CV:  Good peripheral perfusion.  Regular rhythm, tachycardia heart rate 135 Resp:  Normal effort.  Clear to auscultation bilaterally Abd:  Moderately distended.  Soft with diffuse tenderness, worse and left lower quadrant.  Tympany to percussion diffusely.  No signs of peritonitis. Other:  1+ pitting edema bilateral lower extremities, symmetric calf circumference.   ED Results / Procedures  / Treatments   Labs (all labs ordered are listed, but only abnormal results are displayed) Labs Reviewed  COMPREHENSIVE METABOLIC PANEL - Abnormal; Notable for the following components:      Result Value   Chloride 97 (*)    Glucose, Bld 151 (*)    BUN 25 (*)    Creatinine, Ser 1.15 (*)    Calcium 8.7 (*)    GFR, Estimated 48 (*)    All other components within normal limits  CBC WITH DIFFERENTIAL/PLATELET - Abnormal; Notable for the following components:   WBC 11.0 (*)    MCV 100.2 (*)    MCHC 29.9 (*)    Monocytes Absolute 1.4 (*)    Abs Immature Granulocytes 0.09 (*)    All other components within normal limits  C DIFFICILE QUICK SCREEN W PCR REFLEX    GASTROINTESTINAL PANEL BY PCR, STOOL (REPLACES STOOL CULTURE)  CULTURE, BLOOD (ROUTINE X 2)  CULTURE, BLOOD (ROUTINE X 2)  LIPASE, BLOOD  OCCULT BLOOD X 1 CARD TO LAB, STOOL  LACTIC ACID, PLASMA  LACTIC ACID, PLASMA  URINALYSIS, ROUTINE W REFLEX MICROSCOPIC  EKG Interpreted by me Sinus tachycardia rate 127, left axis, normal intervals.  LVH.  Normal ST segments and T waves.   RADIOLOGY CT abdomen pelvis interpreted by me, negative for free air.  There is diffuse distended bowel.  Radiology report reviewed noting signs of enterocolitis, no bowel obstruction.  No perforation   PROCEDURES:  Procedures   MEDICATIONS ORDERED IN ED: Medications  piperacillin-tazobactam (ZOSYN) IVPB 3.375 g (3.375 g Intravenous New Bag/Given 06/30/23 2115)  sodium chloride 0.9 % bolus 1,000 mL (0 mLs Intravenous Stopped 06/30/23 1908)  ondansetron (ZOFRAN) injection 4 mg (4 mg Intravenous Given 06/30/23 1715)  fentaNYL (SUBLIMAZE) injection 50 mcg (50 mcg Intravenous Given 06/30/23 1719)  pantoprazole (PROTONIX) injection 40 mg (40 mg Intravenous Given 06/30/23 1717)  iohexol (OMNIPAQUE) 300 MG/ML solution 100 mL (100 mLs Intravenous Contrast Given 06/30/23 1821)  guaiFENesin (ROBITUSSIN) 100 MG/5ML liquid 15 mL (15 mLs Oral Given 06/30/23  2115)     IMPRESSION / MDM / ASSESSMENT AND PLAN / ED COURSE  I reviewed the triage vital signs and the nursing notes.  DDx: Rectal tube obstruction, small bowel obstruction, colon perforation, diverticulitis, volvulus, AKI, electrolyte derangement, GI bleed  Patient's presentation is most consistent with acute presentation with potential threat to life or bodily function.  Patient presents with abdominal pain, distention, tenderness.  Will need CT scan to further evaluate for potential surgical pathology.  Will give IV fluids for dehydration and tachycardia, IV fentanyl and Zofran and Protonix for relief.  She is tachycardic but other vital signs are unremarkable, not septic on arrival.   Clinical Course as of 06/30/23 2128  Tue Jun 30, 2023  2054 Labs reassuring. CT reveals enterocolitis. Review of records notes recent generalized ileus, which seems to be recurring now. Still tachycardic. Will need to admit for abx, bowel rest, GI management [PS]  2126 Case d/w hospitalist [PS]    Clinical Course User Index [PS] Sharman Cheek, MD     FINAL CLINICAL IMPRESSION(S) / ED DIAGNOSES   Final diagnoses:  Ileus (HCC)  Colitis     Rx / DC Orders   ED Discharge Orders     None        Note:  This document was prepared using Dragon voice recognition software and may include unintentional dictation errors.   Sharman Cheek, MD 06/30/23 2128

## 2023-06-30 NOTE — H&P (Signed)
[place]   PATIENT NAME: [name]    MR#:  [id number]  DATE OF BIRTH:  10/01/1940  DATE OF ADMISSION:  06/30/2023  PRIMARY CARE PHYSICIAN: [name], MD   Patient is coming from: Home  REQUESTING/REFERRING PHYSICIAN: Alfonse Flavors, MD  CHIEF COMPLAINT:   Chief Complaint  Patient presents with   Abdominal Pain    HISTORY OF PRESENT ILLNESS:  [name] is a 83 y.o. female with medical history significant for anxiety, asthma, PAD, COPD, coronary artery disease, gout, hypothyroidism, and hypertension, who presented to the emergency room with acute onset of generalized abdominal pain with associated distention.  The patient has a chronic rectal tube due to sigmoid stricture and has been noticing diminished output.  She had a watery bowel movement in the ER with no melena or bright red bleeding per rectum.  She is to diarrhea over the last few weeks.  She also admits to dry cough without wheezing or dyspnea.  No fever or chills.  No nausea or vomiting.  No dysuria, oliguria or hematuria or flank pain.  No other bleeding diathesis.  ED Course: When she came to the ER, BP was 131/92 with a heart rate of 132 and respiratory rate of 20 and later 26 and temperature 97.1 with pulse oximetry of 95% on 3 L O2 by nasal cannula.  Labs revealed a BUN of 25 with a creatinine of 1.15, blood glucose 151 and chloride of 97 with otherwise unremarkable CMP.  CBC showed WBCs of 11 and was otherwise unremarkable. EKG as reviewed by me : EKG showed sinus tachycardia with a rate of 127 with left anterior fascicular block and LVH by voltage criteria with T wave inversion laterally. Imaging: CT of the abdomen pelvis with contrast revealed the following: 1. Wall thickening and mucosal enhancement of small bowel in the right lower quadrant suspicious for enteritis. Mild air distension of the colon with areas of wall thickening and mucosal enhancement consistent with colitis.  Caliber change in the pelvis at the level of the distal sigmoid colon where there is tethered appearance of the sigmoid colon to a thickened bladder and the left ovary as seen on prior exams. Findings raise concern for possible colon stricture and mild upstream obstruction. Concern previously raised for colovesical fistula, no gas in the bladder on today's study. 2. Nonobstructing right kidney stones. 3. Bronchial wall thickening and mild mucous plugging in the right lower lobe with peribronchovascular hazy density which could be inflammatory. 4. Aortic atherosclerosis.  The patient was given 50 mcg of IV fentanyl, 15 mL p.o. Robitussin, 4 mg of IV Zofran, 40 mg of IV Protonix, 1 L bolus of IV normal saline and 3.375 g of IV Zosyn.  She will be admitted to a medical observation bed for further evaluation and management. PAST MEDICAL HISTORY:   Past Medical History:  Diagnosis Date   Acute on chronic diastolic (congestive) heart failure (HCC)    Anemia    iron deficiency anemia - secondary to blood loss ( chronic)    Anxiety    Arthritis    endstage changes bilateral knees/bilateral ankles.    Asthma    Carotid artery occlusion    Chronic fatigue    Chronic kidney disease    Closed left hip fracture (HCC)    Clotting disorder (HCC)    pt denies this   Contusion of left knee    due to fall 1/14.   COPD (chronic obstructive  pulmonary disease) (HCC)    pulmonary fibrosis   Coronary artery disease    Depression, reactive    Diabetes mellitus    type II    Diastolic dysfunction    Difficulty in walking    Family history of heart disease    Generalized muscle weakness    Gout    High cholesterol    History of falling    Hypertension    Hypothyroidism    Interstitial lung disease (HCC)    Meningioma of left sphenoid wing involving cavernous sinus (HCC) 02/17/2012   Continue diplopia, left eye pain and left headaches.     Morbid obesity (HCC)    Neuromuscular disorder (HCC)     diabetic neuropathy    Normal coronary arteries    cardiac catheterization performed  10/31/14   Pneumonia    RA (rheumatoid arthritis) (HCC)    has been off methotreaxte since 10/13.   Spinal stenosis of lumbar region    Thyroid disease    Unspecified lack of coordination    URI (upper respiratory infection)     PAST SURGICAL HISTORY:   Past Surgical History:  Procedure Laterality Date   BRAIN SURGERY     Gamma knife 10/13. Needs repeat spring  '14   CARDIAC CATHETERIZATION N/A 10/31/2014   Procedure: Right/Left Heart Cath and Coronary Angiography;  Surgeon: Corky Crafts, MD;  Location: [facility] INVASIVE CV LAB;  Service: Cardiovascular;  Laterality: N/A;   COLONOSCOPY WITH PROPOFOL N/A 04/14/2022   Procedure: COLONOSCOPY WITH PROPOFOL;  Surgeon: Beverley Fiedler, MD;  Location: [facility] ENDOSCOPY;  Service: Gastroenterology;  Laterality: N/A;   CYST EXCISION     2 on face   CYST REMOVAL NECK     ESOPHAGOGASTRODUODENOSCOPY (EGD) WITH PROPOFOL N/A 09/14/2014   Procedure: ESOPHAGOGASTRODUODENOSCOPY (EGD) WITH PROPOFOL;  Surgeon: Louis Meckel, MD;  Location: [facility] ENDOSCOPY;  Service: Endoscopy;  Laterality: N/A;   INCISION AND DRAINAGE HIP Left 01/16/2017   Procedure: IRRIGATION AND DEBRIDEMENT LEFT HIP;  Surgeon: Kathryne Hitch, MD;  Location: [facility] ORS;  Service: Orthopedics;  Laterality: Left;   INTRAMEDULLARY (IM) NAIL INTERTROCHANTERIC Left 11/29/2016   Procedure: INTRAMEDULLARY (IM) NAIL INTERTROCHANTRIC;  Surgeon: Kathryne Hitch, MD;  Location: [facility] OR;  Service: Orthopedics;  Laterality: Left;   OVARY SURGERY     RIGHT HEART CATH N/A 08/12/2021   Procedure: RIGHT HEART CATH;  Surgeon: Runell Gess, MD;  Location: [facility] INVASIVE CV LAB;  Service: Cardiovascular;  Laterality: N/A;   SHOULDER SURGERY Left    TONSILLECTOMY  age 48   VAGINAL HYSTERECTOMY     VIDEO BRONCHOSCOPY Bilateral 05/31/2013   Procedure: VIDEO BRONCHOSCOPY WITHOUT FLUORO;  Surgeon: Kalman Shan, MD;  Location: [facility] ENDOSCOPY;  Service: Cardiopulmonary;  Laterality: Bilateral;   video bronscoscopy  05/19/1998   lung    SOCIAL HISTORY:   Social History   Tobacco Use   Smoking status: Never   Smokeless tobacco: Never  Substance Use Topics   Alcohol use: No    FAMILY HISTORY:   Family History  Problem Relation Age of Onset   Diabetes Mother    Heart attack Mother    Hypertension Father    Lung cancer Father    Diabetes Sister    Breast cancer Sister    Breast cancer Sister    Diabetes Brother    Hypertension Brother    Heart disease Brother    Heart attack Brother    Kidney cancer Brother    Gout  Brother    Kidney failure Brother        x 5   Esophageal cancer Brother    Stomach cancer Brother    Prostate cancer Brother    Uterine cancer Daughter        with mets   Fibroids Daughter    Hypertension Daughter    Hypertension Daughter    Hypertension Son    Heart murmur Son    Rheum arthritis Maternal Uncle    Alzheimer's disease Neg Hx    Dementia Neg Hx     DRUG ALLERGIES:   Allergies  Allergen Reactions   Codeine Swelling and Other (See Comments)    Facial swelling, Chest pain, and swelling in legs    Infliximab Anaphylaxis    (REMICADE) "sent me into shock"    Lisinopril Swelling and Rash    Face and neck swelling    Ofev [Nintedanib] Diarrhea    REVIEW OF SYSTEMS:   ROS As per history of present illness. All pertinent systems were reviewed above. Constitutional, HEENT, cardiovascular, respiratory, GI, GU, musculoskeletal, neuro, psychiatric, endocrine, integumentary and hematologic systems were reviewed and are otherwise negative/unremarkable except for positive findings mentioned above in the HPI.   MEDICATIONS AT HOME:   Prior to Admission medications   Medication Sig Start Date End Date Taking? Authorizing Provider  acetaminophen (TYLENOL) 650 MG suppository Place 1 suppository (650 mg total) rectally every 4 (four) hours  as needed for moderate pain (pain score 4-6) or fever. 06/26/23   Elgergawy, Leana Roe, MD  albuterol (PROVENTIL) (2.5 MG/3ML) 0.083% nebulizer solution Take 3 mLs (2.5 mg total) by nebulization every 6 (six) hours as needed for wheezing. 05/03/21   Medina-Vargas, Monina C, NP  albuterol (VENTOLIN HFA) 108 (90 Base) MCG/ACT inhaler Inhale 2 puffs into the lungs as needed for wheezing or shortness of breath.    [provider]  allopurinol (ZYLOPRIM) 300 MG tablet Take 1 tablet (300 mg total) by mouth daily. 05/03/21   Medina-Vargas, Monina C, NP  Artificial Tear Ointment (DRY EYES OP) Place 2 drops into both eyes as needed (dry eyes).    [provider]  atorvastatin (LIPITOR) 10 MG tablet Take 1 tablet (10 mg total) by mouth daily. Patient taking differently: Take 10 mg by mouth at bedtime. 08/14/22   [name], MD  azithromycin (ZITHROMAX) 250 MG tablet Take 1 tablet (250 mg total) by mouth every Monday, Wednesday, and Friday. Take every Monday, Wednesday, Friday. 06/03/23 06/02/24  Kalman Shan, MD  Calcium Carb-Cholecalciferol (CALCIUM + D3 PO) Take 2 tablets by mouth daily.    [provider]  Camphor-Menthol-Methyl Sal (SALONPAS EX) Apply 1 patch topically as needed (shoulder and leg pain).    [provider]  Cholecalciferol (VITAMIN D3) 50 MCG (2000 UT) capsule Take 2,000 Units by mouth daily.    [provider]  colchicine 0.6 MG tablet Take 1 tablet (0.6 mg total) by mouth daily as needed (gout flares). 05/03/21   Medina-Vargas, Monina C, NP  diclofenac Sodium (VOLTAREN) 1 % GEL Apply 2 g topically 3 (three) times daily. Apply to both hands and knees Patient taking differently: Apply 1 Application topically in the morning, at noon, and at bedtime. 05/03/21   Medina-Vargas, Monina C, NP  docusate sodium (COLACE) 100 MG capsule Take 2 capsules (200 mg total) by mouth 2 (two) times daily. Hold for diarrhea. 06/26/23   Elgergawy, Leana Roe, MD   donepezil (ARICEPT) 10 MG tablet Take 1 tablet (10  mg total) by mouth at bedtime. 09/08/22   Anson Fret, MD  DULoxetine (CYMBALTA) 60 MG capsule Take 1 capsule (60 mg total) by mouth daily. 11/27/22   Jones Bales, NP  fluticasone (FLONASE) 50 MCG/ACT nasal spray Place 1 spray into both nostrils daily. Patient taking differently: Place 2 sprays into both nostrils daily as needed for allergies. 11/28/22   [name], MD  furosemide (LASIX) 20 MG tablet Take 3 tablets (60 mg total) by mouth daily. 06/27/23   Elgergawy, Leana Roe, MD  gabapentin (NEURONTIN) 600 MG tablet Take 0.5 tablets (300 mg total) by mouth 2 (two) times daily. 06/26/23   Elgergawy, Leana Roe, MD  guaiFENesin (MUCINEX) 600 MG 12 hr tablet Take 2 tablets (1,200 mg total) by mouth 2 (two) times daily. 05/03/21   Medina-Vargas, Monina C, NP  hydroxychloroquine (PLAQUENIL) 200 MG tablet Take 1 tablet (200 mg total) by mouth daily. 05/03/21   Medina-Vargas, Monina C, NP  leflunomide (ARAVA) 20 MG tablet Take 1 tablet (20 mg total) by mouth daily. 05/03/21 08/09/23  Medina-Vargas, Monina C, NP  levothyroxine (SYNTHROID) 50 MCG tablet Take 1 tablet (50 mcg total) by mouth daily. 11/28/22   [name], MD  lidocaine (LIDODERM) 5 % Place 1 patch onto the skin daily. Remove & Discard patch within 12 hours or as directed by MD 12/24/22   [name], MD  Menthol, Topical Analgesic, (BIOFREEZE ROLL-ON EX) Apply 1 Application topically as needed (pain).    [provider]  metolazone (ZAROXOLYN) 2.5 MG tablet TAKE 1 TABLET BY MOUTH DAILY AS NEEDED FOR 2 DAYS FOR WEIGHT ABOVE 255 POUNDS 05/03/21   Medina-Vargas, Monina C, NP  metoprolol tartrate (LOPRESSOR) 50 MG tablet Take 1 tablet (50 mg total) by mouth 2 (two) times daily. 06/26/23   Elgergawy, Leana Roe, MD  morphine (MS CONTIN) 15 MG 12 hr tablet Take 1 tablet (15 mg total) by mouth every 12 (twelve) hours. 06/26/23   Elgergawy, Leana Roe, MD  Multiple Vitamin  (MULTIVITAMIN WITH MINERALS) TABS Take 1 tablet by mouth daily.     [provider]  ondansetron (ZOFRAN-ODT) 4 MG disintegrating tablet Take 1 tablet (4 mg total) by mouth every 8 (eight) hours as needed for nausea or vomiting. 06/12/23   [name], MD  oxyCODONE (OXY IR/ROXICODONE) 5 MG immediate release tablet Take 1 tablet (5 mg total) by mouth every 6 (six) hours as needed for moderate pain (pain score 4-6). 06/26/23   Elgergawy, Leana Roe, MD  OXYGEN Inhale 3 L into the lungs See admin instructions. 3 L at bedtime, and 3 L during the day as needed for exertion    [provider]  pantoprazole (PROTONIX) 40 MG tablet Take 1 tablet (40 mg total) by mouth 2 (two) times daily. 06/23/23   [name], MD  polyethylene glycol (MIRALAX / GLYCOLAX) 17 g packet Take 17 g by mouth 2 (two) times daily. Hold for diarrhea 06/26/23   Elgergawy, Leana Roe, MD  Potassium Chloride ER 20 MEQ TBCR Please take 20 mg p.o. twice daily for first 3 days, then go back to 20 mg oral daily, monitor frequently and adjust as needed 06/26/23   Elgergawy, Leana Roe, MD  predniSONE (DELTASONE) 10 MG tablet Take 1 tablet (10 mg total) by mouth daily with breakfast. 05/22/21   [name], MD  simethicone (MYLICON) 80 MG chewable tablet Chew 1 tablet (80 mg total) by mouth every 6 (six) hours as  needed for flatulence. 06/26/23   Elgergawy, Leana Roe, MD  spironolactone (ALDACTONE) 50 MG tablet Take 1 tablet (50 mg total) by mouth daily. 06/27/23   Elgergawy, Leana Roe, MD      VITAL SIGNS:  Blood pressure 113/75, pulse (!) 132, temperature 99.5 F (37.5 C), temperature source Oral, resp. rate 18, height 5\' 3"  (1.6 m), weight 90.7 kg, SpO2 98%.  PHYSICAL EXAMINATION:  Physical Exam  GENERAL:  83 y.o.-year-old patient lying in the bed with no acute distress.  EYES: Pupils equal, round, reactive to light and accommodation. No scleral icterus. Extraocular muscles intact.  HEENT: Head atraumatic, normocephalic.  Oropharynx and nasopharynx clear.  NECK:  Supple, no jugular venous distention. No thyroid enlargement, no tenderness.  LUNGS: Normal breath sounds bilaterally, no wheezing, rales,rhonchi or crepitation. No use of accessory muscles of respiration.  CARDIOVASCULAR: Regular rate and rhythm, S1, S2 normal. No murmurs, rubs, or gallops.  ABDOMEN: Soft, distended, nontender. Bowel sounds are slightly diminished. No organomegaly or mass.  EXTREMITIES: No pedal edema, cyanosis, or clubbing.  NEUROLOGIC: Cranial nerves II through XII are intact. Muscle strength 5/5 in all extremities. Sensation intact. Gait not checked.  PSYCHIATRIC: The patient is alert and oriented x 3.  Normal affect and good eye contact. SKIN: No obvious rash, lesion, or ulcer.   LABORATORY PANEL:   CBC Recent Labs  Lab 06/30/23 1711  WBC 11.0*  HGB 13.2  HCT 44.2  PLT 310   ------------------------------------------------------------------------------------------------------------------  Chemistries  Recent Labs  Lab 06/26/23 0527 06/30/23 1711  NA 143 141  K 3.0* 3.9  CL 99 97*  CO2 31 30  GLUCOSE 96 151*  BUN 9 25*  CREATININE 0.74 1.15*  CALCIUM 8.1* 8.7*  MG 1.6*  --   AST  --  18  ALT  --  19  ALKPHOS  --  54  BILITOT  --  1.0   ------------------------------------------------------------------------------------------------------------------  Cardiac Enzymes No results for input(s): "TROPONINI" in the last 168 hours. ------------------------------------------------------------------------------------------------------------------  RADIOLOGY:  CT ABDOMEN PELVIS W CONTRAST Result Date: 06/30/2023 CLINICAL DATA:  Abdomen pain left lower quadrant pain EXAM: CT ABDOMEN AND PELVIS WITH CONTRAST TECHNIQUE: Multidetector CT imaging of the abdomen and pelvis was performed using the standard protocol following bolus administration of intravenous contrast. RADIATION DOSE REDUCTION: This exam was performed  according to the departmental dose-optimization program which includes automated exposure control, adjustment of the mA and/or kV according to patient size and/or use of iterative reconstruction technique. CONTRAST:  OMNIPAQUE IOHEXOL 300 MG/ML  SOLN COMPARISON:  Radiograph 06/26/2023, CT 12/15/2022, 03/01/2022 FINDINGS: Lower chest: Lung bases demonstrate bronchial wall thickening and mild mucous plugging in the right lower lobe. Peribronchovascular hazy density could be inflammatory. Hepatobiliary: No focal liver abnormality is seen. No gallstones, gallbladder wall thickening, or biliary dilatation. Pancreas: Unremarkable. No pancreatic ductal dilatation or surrounding inflammatory changes. Spleen: Normal in size without focal abnormality. Adrenals/Urinary Tract: Adrenal glands are within normal limits. Kidneys show no hydronephrosis. Multiple renal cysts for which no imaging follow-up is recommended. Small nonobstructing right kidney stones. Decompressed urinary bladder with diffuse wall thickening Stomach/Bowel: Stomach nonenlarged. Thickened small bowel loops. In the right lower quadrant with mucosal enhancement. Fluid within the colon. Air distension of cecum. Wall thickening and mucosal enhancement of the: With some inflammation surrounding the descending colon. Caliber change of the colon in the pelvis in the region of the thickened bladder and left ovary is seen on prior exams, series 2, image 69 through 74. No frank gas  within the urinary bladder at this time. Vascular/Lymphatic: Aortic atherosclerosis. No enlarged abdominal or pelvic lymph nodes. Reproductive: Hysterectomy. Left ovary tethered to a loop of sigmoid colon and bladder. Other: Negative for pelvic effusion or free air. Musculoskeletal: Multilevel degenerative changes. No acute osseous abnormality IMPRESSION: 1. Wall thickening and mucosal enhancement of small bowel in the right lower quadrant suspicious for enteritis. Mild air distension  of the colon with areas of wall thickening and mucosal enhancement consistent with colitis. Caliber change in the pelvis at the level of the distal sigmoid colon where there is tethered appearance of the sigmoid colon to a thickened bladder and the left ovary as seen on prior exams. Findings raise concern for possible colon stricture and mild upstream obstruction. Concern previously raised for colovesical fistula, no gas in the bladder on today's study. 2. Nonobstructing right kidney stones. 3. Bronchial wall thickening and mild mucous plugging in the right lower lobe with peribronchovascular hazy density which could be inflammatory. 4. Aortic atherosclerosis. Aortic Atherosclerosis (ICD10-I70.0). Electronically Signed   By: Jasmine Pang M.D.   On: 06/30/2023 19:19      IMPRESSION AND PLAN:  Assessment and Plan: * Enterocolitis - The patient will be admitted to a med-surgical bed. - We will continue hydration with IV lactated ringer. - Will keep n.p.o. except for medications for now. - Two-view abdomen x-ray will be followed in AM. - She will be placed on IV Rocephin and Flagyl.   Sepsis due to undetermined organism [facility]) - This manifested by tachycardia and tachypnea. - We will place her on IV Rocephin and Flagyl. - We will follow stool studies including pathogens and C. difficile. - We will continue hydration as mentioned above. - This is like secondary to #1.  Hypothyroidism - Continue Synthroid.  Essential hypertension - We will continue Lopressor.  Dyslipidemia - We will continue statin therapy.  Peripheral neuropathy - We will continue Neurontin.  GERD without esophagitis - We will continue PPI Therapy.  Gout - We will continue allopurinol. - Colchicine will be held off for diarrhea for now. - The patient has no current acute attack.  Rheumatoid arthritis (HCC) - We will continue her Plaquenil and leflunomide.   DVT prophylaxis: Lovenox.  Advanced Care Planning:   Code Status: full code.  Family Communication:  The plan of care was discussed in details with the patient (and family). I answered all questions. The patient agreed to proceed with the above mentioned plan. Further management will depend upon hospital course. Disposition Plan: Back to previous home environment Consults called: none.  All the records are reviewed and case discussed with ED provider.  Status is: Observation  I certify that at the time of admission, it is my clinical judgment that the patient will require hospital care extending less than 2 midnights.                            Dispo: The patient is from: Home              Anticipated d/c is to: Home              Patient currently is not medically stable to d/c.              Difficult to place patient: No  Hannah Beat M.D on 06/30/2023 at 11:05 PM  [facility]   From 7 PM-7 AM, contact night-coverage [email or web address]  CC: Primary care physician; Abbe Amsterdam  R, MD

## 2023-06-30 NOTE — ED Notes (Signed)
CB within reach. Family at bedside. Pt denies any needs at this time. Encouraged to alert staff to any needs.

## 2023-06-30 NOTE — ED Triage Notes (Signed)
Pt to ED [facility] from [facility] for  LLQ pain since this morning that is pressure-like and feels like needs to have BM. Per daughter, pt has area to LLQ where intestine is becoming stenotic since about 2 months and this started after she was on OzympicPt has rectal tube in place since about 2 weeks. Intermittent nausea, no vomiting. No urinary sx.   93% on 3L, 98.4, CBG 183, normal EKG, 130/80. HR 130s, RR 20 EMS VS:   Wears 3L chronic oxygen. Pt is alert, oriented

## 2023-06-30 NOTE — Assessment & Plan Note (Signed)
-  We will continue Neurontin. ?

## 2023-06-30 NOTE — Assessment & Plan Note (Addendum)
Having bowel movement and no vomiting. - We will continue hydration with IV lactated ringer. - Starting on clear liquid -Continue with ceftriaxone and Flagyl for now

## 2023-06-30 NOTE — Assessment & Plan Note (Signed)
-  We will continue PPI Therapy.

## 2023-06-30 NOTE — Telephone Encounter (Signed)
Please do a Friday evening video visit in february

## 2023-06-30 NOTE — ED Provider Triage Note (Addendum)
Emergency Medicine Provider Triage Evaluation Note  Mckaylie YAJAHIRA TISON , a 83 y.o. female  was evaluated in triage.  Pt complains of LLQ abdominal pain, per daughter patient has a part of her bowel that is closing up due to being on ozempic.  Patient has a rectal tube in place.  Review of Systems  Positive: LLQ abdominal pain, nausea Negative: Urinary symptoms, vomiting  Physical Exam  There were no vitals taken for this visit. Gen:   Awake, no distress   Resp:  Normal effort  MSK:   Moves extremities without difficulty  Other:    Medical Decision Making  Medically screening exam initiated at 3:25 PM.  Appropriate orders placed.  Lainy F Richner was informed that the remainder of the evaluation will be completed by another provider, this initial triage assessment does not replace that evaluation, and the importance of remaining in the ED until their evaluation is complete.     Cameron Ali, PA-C 06/30/23 1529    Cameron Ali, PA-C 06/30/23 1530

## 2023-06-30 NOTE — Assessment & Plan Note (Addendum)
Continue Synthroid

## 2023-06-30 NOTE — Assessment & Plan Note (Signed)
-  We will continue statin therapy.

## 2023-06-30 NOTE — ED Notes (Signed)
Report given to [place], Charity fundraiser. Pt to be moved to room 38

## 2023-06-30 NOTE — Assessment & Plan Note (Signed)
-   We will continue allopurinol. - Colchicine will be held off for diarrhea for now. - The patient has no current acute attack.

## 2023-06-30 NOTE — ED Notes (Signed)
Awaiting approval of zosyn from pharmacy

## 2023-06-30 NOTE — Assessment & Plan Note (Addendum)
-   This manifested by tachycardia and tachypnea. - We will place her on IV Rocephin and Flagyl. - We will follow stool studies including pathogens and C. difficile. - We will continue hydration as mentioned above. - This is like secondary to #1.

## 2023-06-30 NOTE — Assessment & Plan Note (Signed)
-   We will continue her Plaquenil and leflunomide.

## 2023-06-30 NOTE — Assessment & Plan Note (Signed)
-   We will continue Lopressor. -Holding home diuretic as she appears dry and normal BNP

## 2023-07-01 ENCOUNTER — Encounter: Payer: Self-pay | Admitting: Family Medicine

## 2023-07-01 ENCOUNTER — Observation Stay: Payer: Medicare Other

## 2023-07-01 DIAGNOSIS — Z6835 Body mass index (BMI) 35.0-35.9, adult: Secondary | ICD-10-CM | POA: Diagnosis not present

## 2023-07-01 DIAGNOSIS — M1A09X0 Idiopathic chronic gout, multiple sites, without tophus (tophi): Secondary | ICD-10-CM

## 2023-07-01 DIAGNOSIS — J9611 Chronic respiratory failure with hypoxia: Secondary | ICD-10-CM | POA: Diagnosis present

## 2023-07-01 DIAGNOSIS — G629 Polyneuropathy, unspecified: Secondary | ICD-10-CM | POA: Diagnosis not present

## 2023-07-01 DIAGNOSIS — J8410 Pulmonary fibrosis, unspecified: Secondary | ICD-10-CM | POA: Diagnosis present

## 2023-07-01 DIAGNOSIS — D500 Iron deficiency anemia secondary to blood loss (chronic): Secondary | ICD-10-CM | POA: Diagnosis present

## 2023-07-01 DIAGNOSIS — M069 Rheumatoid arthritis, unspecified: Secondary | ICD-10-CM | POA: Diagnosis present

## 2023-07-01 DIAGNOSIS — K219 Gastro-esophageal reflux disease without esophagitis: Secondary | ICD-10-CM

## 2023-07-01 DIAGNOSIS — N281 Cyst of kidney, acquired: Secondary | ICD-10-CM | POA: Diagnosis not present

## 2023-07-01 DIAGNOSIS — M05742 Rheumatoid arthritis with rheumatoid factor of left hand without organ or systems involvement: Secondary | ICD-10-CM

## 2023-07-01 DIAGNOSIS — E039 Hypothyroidism, unspecified: Secondary | ICD-10-CM | POA: Diagnosis present

## 2023-07-01 DIAGNOSIS — K5289 Other specified noninfective gastroenteritis and colitis: Secondary | ICD-10-CM | POA: Diagnosis not present

## 2023-07-01 DIAGNOSIS — M170 Bilateral primary osteoarthritis of knee: Secondary | ICD-10-CM | POA: Diagnosis not present

## 2023-07-01 DIAGNOSIS — H04129 Dry eye syndrome of unspecified lacrimal gland: Secondary | ICD-10-CM | POA: Diagnosis not present

## 2023-07-01 DIAGNOSIS — J849 Interstitial pulmonary disease, unspecified: Secondary | ICD-10-CM | POA: Diagnosis present

## 2023-07-01 DIAGNOSIS — R197 Diarrhea, unspecified: Secondary | ICD-10-CM | POA: Diagnosis not present

## 2023-07-01 DIAGNOSIS — K529 Noninfective gastroenteritis and colitis, unspecified: Secondary | ICD-10-CM | POA: Diagnosis present

## 2023-07-01 DIAGNOSIS — I5032 Chronic diastolic (congestive) heart failure: Secondary | ICD-10-CM | POA: Diagnosis present

## 2023-07-01 DIAGNOSIS — Z741 Need for assistance with personal care: Secondary | ICD-10-CM | POA: Diagnosis not present

## 2023-07-01 DIAGNOSIS — E785 Hyperlipidemia, unspecified: Secondary | ICD-10-CM | POA: Diagnosis not present

## 2023-07-01 DIAGNOSIS — R918 Other nonspecific abnormal finding of lung field: Secondary | ICD-10-CM | POA: Diagnosis not present

## 2023-07-01 DIAGNOSIS — K56609 Unspecified intestinal obstruction, unspecified as to partial versus complete obstruction: Secondary | ICD-10-CM | POA: Diagnosis not present

## 2023-07-01 DIAGNOSIS — J4489 Other specified chronic obstructive pulmonary disease: Secondary | ICD-10-CM | POA: Diagnosis present

## 2023-07-01 DIAGNOSIS — K5730 Diverticulosis of large intestine without perforation or abscess without bleeding: Secondary | ICD-10-CM | POA: Diagnosis not present

## 2023-07-01 DIAGNOSIS — D649 Anemia, unspecified: Secondary | ICD-10-CM | POA: Diagnosis not present

## 2023-07-01 DIAGNOSIS — G3184 Mild cognitive impairment, so stated: Secondary | ICD-10-CM | POA: Diagnosis not present

## 2023-07-01 DIAGNOSIS — J9811 Atelectasis: Secondary | ICD-10-CM | POA: Diagnosis present

## 2023-07-01 DIAGNOSIS — M05741 Rheumatoid arthritis with rheumatoid factor of right hand without organ or systems involvement: Secondary | ICD-10-CM | POA: Diagnosis not present

## 2023-07-01 DIAGNOSIS — F419 Anxiety disorder, unspecified: Secondary | ICD-10-CM | POA: Diagnosis not present

## 2023-07-01 DIAGNOSIS — R1031 Right lower quadrant pain: Secondary | ICD-10-CM | POA: Diagnosis not present

## 2023-07-01 DIAGNOSIS — Z9981 Dependence on supplemental oxygen: Secondary | ICD-10-CM | POA: Diagnosis not present

## 2023-07-01 DIAGNOSIS — E66812 Obesity, class 2: Secondary | ICD-10-CM | POA: Diagnosis present

## 2023-07-01 DIAGNOSIS — Z743 Need for continuous supervision: Secondary | ICD-10-CM | POA: Diagnosis not present

## 2023-07-01 DIAGNOSIS — K56699 Other intestinal obstruction unspecified as to partial versus complete obstruction: Secondary | ICD-10-CM | POA: Diagnosis not present

## 2023-07-01 DIAGNOSIS — I444 Left anterior fascicular block: Secondary | ICD-10-CM | POA: Diagnosis present

## 2023-07-01 DIAGNOSIS — Z7952 Long term (current) use of systemic steroids: Secondary | ICD-10-CM | POA: Diagnosis not present

## 2023-07-01 DIAGNOSIS — E876 Hypokalemia: Secondary | ICD-10-CM | POA: Diagnosis not present

## 2023-07-01 DIAGNOSIS — I110 Hypertensive heart disease with heart failure: Secondary | ICD-10-CM | POA: Diagnosis present

## 2023-07-01 DIAGNOSIS — E1140 Type 2 diabetes mellitus with diabetic neuropathy, unspecified: Secondary | ICD-10-CM | POA: Diagnosis present

## 2023-07-01 DIAGNOSIS — M6259 Muscle wasting and atrophy, not elsewhere classified, multiple sites: Secondary | ICD-10-CM | POA: Diagnosis not present

## 2023-07-01 DIAGNOSIS — A419 Sepsis, unspecified organism: Secondary | ICD-10-CM | POA: Diagnosis present

## 2023-07-01 DIAGNOSIS — N200 Calculus of kidney: Secondary | ICD-10-CM | POA: Diagnosis present

## 2023-07-01 DIAGNOSIS — H2513 Age-related nuclear cataract, bilateral: Secondary | ICD-10-CM | POA: Diagnosis not present

## 2023-07-01 DIAGNOSIS — E1169 Type 2 diabetes mellitus with other specified complication: Secondary | ICD-10-CM | POA: Diagnosis not present

## 2023-07-01 DIAGNOSIS — R278 Other lack of coordination: Secondary | ICD-10-CM | POA: Diagnosis not present

## 2023-07-01 DIAGNOSIS — R279 Unspecified lack of coordination: Secondary | ICD-10-CM | POA: Diagnosis not present

## 2023-07-01 DIAGNOSIS — Z1152 Encounter for screening for COVID-19: Secondary | ICD-10-CM | POA: Diagnosis not present

## 2023-07-01 DIAGNOSIS — E860 Dehydration: Secondary | ICD-10-CM | POA: Diagnosis present

## 2023-07-01 DIAGNOSIS — M6281 Muscle weakness (generalized): Secondary | ICD-10-CM | POA: Diagnosis not present

## 2023-07-01 DIAGNOSIS — M109 Gout, unspecified: Secondary | ICD-10-CM | POA: Diagnosis present

## 2023-07-01 DIAGNOSIS — I10 Essential (primary) hypertension: Secondary | ICD-10-CM | POA: Diagnosis not present

## 2023-07-01 DIAGNOSIS — I739 Peripheral vascular disease, unspecified: Secondary | ICD-10-CM | POA: Diagnosis not present

## 2023-07-01 DIAGNOSIS — G8929 Other chronic pain: Secondary | ICD-10-CM | POA: Diagnosis not present

## 2023-07-01 DIAGNOSIS — K5732 Diverticulitis of large intestine without perforation or abscess without bleeding: Secondary | ICD-10-CM | POA: Diagnosis present

## 2023-07-01 DIAGNOSIS — I2510 Atherosclerotic heart disease of native coronary artery without angina pectoris: Secondary | ICD-10-CM | POA: Diagnosis not present

## 2023-07-01 DIAGNOSIS — R0989 Other specified symptoms and signs involving the circulatory and respiratory systems: Secondary | ICD-10-CM | POA: Diagnosis not present

## 2023-07-01 LAB — BASIC METABOLIC PANEL
Anion gap: 12 (ref 5–15)
BUN: 22 mg/dL (ref 8–23)
CO2: 26 mmol/L (ref 22–32)
Calcium: 8 mg/dL — ABNORMAL LOW (ref 8.9–10.3)
Chloride: 104 mmol/L (ref 98–111)
Creatinine, Ser: 1.09 mg/dL — ABNORMAL HIGH (ref 0.44–1.00)
GFR, Estimated: 51 mL/min — ABNORMAL LOW (ref >60)
Glucose, Bld: 109 mg/dL — ABNORMAL HIGH (ref 70–99)
Potassium: 3.5 mmol/L (ref 3.5–5.1)
Sodium: 142 mmol/L (ref 135–145)

## 2023-07-01 LAB — BRAIN NATRIURETIC PEPTIDE: B Natriuretic Peptide: 74.1 pg/mL (ref 0.0–100.0)

## 2023-07-01 LAB — CBC
HCT: 38.6 % (ref 36.0–46.0)
Hemoglobin: 11.6 g/dL — ABNORMAL LOW (ref 12.0–15.0)
MCH: 30.3 pg (ref 26.0–34.0)
MCHC: 30.1 g/dL (ref 30.0–36.0)
MCV: 100.8 fL — ABNORMAL HIGH (ref 80.0–100.0)
Platelets: 238 10*3/uL (ref 150–400)
RBC: 3.83 MIL/uL — ABNORMAL LOW (ref 3.87–5.11)
RDW: 15.5 % (ref 11.5–15.5)
WBC: 9.1 10*3/uL (ref 4.0–10.5)
nRBC: 0 % (ref 0.0–0.2)

## 2023-07-01 LAB — RESP PANEL BY RT-PCR (RSV, FLU A&B, COVID)  RVPGX2
Influenza A by PCR: NEGATIVE
Influenza B by PCR: NEGATIVE
Resp Syncytial Virus by PCR: NEGATIVE
SARS Coronavirus 2 by RT PCR: NEGATIVE

## 2023-07-01 LAB — PROTIME-INR
INR: 1.1 (ref 0.8–1.2)
Prothrombin Time: 13.9 s (ref 11.4–15.2)

## 2023-07-01 MED ORDER — PREDNISONE 10 MG PO TABS
10.0000 mg | ORAL_TABLET | Freq: Every day | ORAL | Status: DC
  Administered 2023-07-02 – 2023-07-10 (×9): 10 mg via ORAL
  Filled 2023-07-01 (×9): qty 1

## 2023-07-01 MED ORDER — SIMETHICONE 80 MG PO CHEW: 80.0000 mg | CHEWABLE_TABLET | Freq: Four times a day (QID) | ORAL | Status: DC | PRN

## 2023-07-01 MED ORDER — ATORVASTATIN CALCIUM 10 MG PO TABS
10.0000 mg | ORAL_TABLET | Freq: Every day | ORAL | Status: DC
  Administered 2023-07-01 – 2023-07-09 (×9): 10 mg via ORAL
  Filled 2023-07-01 (×10): qty 1

## 2023-07-01 MED ORDER — BENZONATATE 100 MG PO CAPS
100.0000 mg | ORAL_CAPSULE | Freq: Two times a day (BID) | ORAL | Status: DC | PRN
  Administered 2023-07-01 – 2023-07-07 (×4): 100 mg via ORAL
  Filled 2023-07-01 (×4): qty 1

## 2023-07-01 NOTE — Hospital Course (Addendum)
Taken from H&P.  [name] is a 83 y.o. female with medical history significant for anxiety, asthma, PAD, COPD, coronary artery disease, gout, hypothyroidism, and hypertension, who presented to the emergency room with acute onset of generalized abdominal pain with associated distention.  The patient has a chronic rectal tube due to sigmoid stricture and has been noticing diminished output.  She had a watery bowel movement in the ER with no melena or bright red bleeding per rectum.  She is to diarrhea over the last few weeks.   Patient had a recent prolonged hospitalization at [facility] when she was admitted with concern of septic shock requiring pressors secondary to RSV, influenza A, urine cultures were positive for Klebsiella and E. coli and she received appropriate management.  Her hospital stay was also complicated with developing ileus which she was recovered before discharge.  Under sedation she had tachycardia and mild tachypnea, rest of the vitals within normal limits, labs pertinent for BUN 25 creatinine 1.15, WBC 11. EKG with sinus tachycardia, left anterior fascicular block, LVH and T wave inversion laterally. CT abdomen and pelvis some wall thickening and mucosal enhancement concerning for right lower quadrant enteritis and mild wall thickening and enhancement of the colon consistent with colitis. Incidental finding of nonobstructing right kidney stones. Bronchial wall thickening and mild mucous plugging in the right lower lobe with peribronchiolar hazy density with could be inflammatory.  Patient was started on ceftriaxone and Flagyl  2/12: Remained mildly tachycardic, leukocytosis resolved but all cell line decreased, creatinine with some improvement to 1.09, GFR improved to 51, baseline GFR more than 60, patient meeting the criteria for AKI.  BNP 74 Chest x-ray with streaky bibasilar opacities, may represent atelectasis versus pneumonia.  C. difficile and GI pathogen panel  negative Respiratory panel for COVID, influenza and RSV negative.  Patient has positive influenza A and RSV 2 weeks ago. Holding diuretic which include Lasix, spironolactone and metolazone today. Giving some IV fluid

## 2023-07-01 NOTE — ED Notes (Signed)
Lab called to recollect PT

## 2023-07-01 NOTE — ED Notes (Signed)
Cleaned pt up, new linens and brief placed. X1 liquid stool

## 2023-07-01 NOTE — Plan of Care (Signed)
Problem: Respiratory: Goal: Ability to maintain adequate ventilation will improve Outcome: Progressing   Problem: Clinical Measurements: Goal: Will remain free from infection Outcome: Progressing

## 2023-07-01 NOTE — Assessment & Plan Note (Signed)
With history of ILD.  Seems stable on her baseline oxygen requirement of 3 L. -Continue with home dose of prednisone 10 mg daily

## 2023-07-01 NOTE — ED Notes (Signed)
PT linens changed and pt cleaned up, put into a gown. New brief placed. X1 urination, x1 stool.

## 2023-07-01 NOTE — ED Notes (Signed)
Hospitalist notified of pt tachycardia. See orders,

## 2023-07-01 NOTE — ED Notes (Signed)
This RN has notified pharmacy x2 aboout pt medications from last night not being verified. Gave a family member to call that is willing to verify Azucena Kuba) pt daughter.

## 2023-07-01 NOTE — Assessment & Plan Note (Signed)
Last 2D echo in 2023 showed LVEF of 55% with grade 1 diastolic dysfunction, BNP within normal limit at 74 and clinically appears dry due to poor po intake. -Holding home diuretics which include Lasix, metolazone and spironolactone. -Monitor volume status closely as patient is getting IV fluid

## 2023-07-01 NOTE — Progress Notes (Signed)
PT Cancellation Note  Patient Details Name: [name] MRN: [id number] DOB: 06/08/1940   Cancelled Treatment:    Reason Eval/Treat Not Completed: Medical issues which prohibited therapy Orders received, chart reviewed.  Pt has been having ongoing issues with sustained tachycardia, per discussion with RN it is probably best to hold off today until we can get heart rate under control.  Will maintain on caseload and attempt to see when appropriate.    Malachi Pro 07/01/2023, 3:04 PM

## 2023-07-01 NOTE — ED Notes (Addendum)
Pt requesting something for cough, hospitalist messaged and notified about pt home medications still not verified by pharmacy.

## 2023-07-01 NOTE — Progress Notes (Signed)
Progress Note   Patient: [name]:[id number] DOB: 11-18-1940 DOA: 06/30/2023     0 DOS: the patient was seen and examined on 07/01/2023   Brief hospital course: Taken from H&P.  Lynnett F Scarpati is a 83 y.o. female with medical history significant for anxiety, asthma, PAD, COPD, coronary artery disease, gout, hypothyroidism, and hypertension, who presented to the emergency room with acute onset of generalized abdominal pain with associated distention.  The patient has a chronic rectal tube due to sigmoid stricture and has been noticing diminished output.  She had a watery bowel movement in the ER with no melena or bright red bleeding per rectum.  She is to diarrhea over the last few weeks.   Patient had a recent prolonged hospitalization at [facility] when she was admitted with concern of septic shock requiring pressors secondary to RSV, influenza A, urine cultures were positive for Klebsiella and E. coli and she received appropriate management.  Her hospital stay was also complicated with developing ileus which she was recovered before discharge.  Under sedation she had tachycardia and mild tachypnea, rest of the vitals within normal limits, labs pertinent for BUN 25 creatinine 1.15, WBC 11. EKG with sinus tachycardia, left anterior fascicular block, LVH and T wave inversion laterally. CT abdomen and pelvis some wall thickening and mucosal enhancement concerning for right lower quadrant enteritis and mild wall thickening and enhancement of the colon consistent with colitis. Incidental finding of nonobstructing right kidney stones. Bronchial wall thickening and mild mucous plugging in the right lower lobe with peribronchiolar hazy density with could be inflammatory.  Patient was started on ceftriaxone and Flagyl  2/12: Remained mildly tachycardic, leukocytosis resolved but all cell line decreased, creatinine with some improvement to 1.09, GFR improved to 51, baseline GFR more than  60, patient meeting the criteria for AKI.  BNP 74 Chest x-ray with streaky bibasilar opacities, may represent atelectasis versus pneumonia.  C. difficile and GI pathogen panel negative Respiratory panel for COVID, influenza and RSV negative.  Patient has positive influenza A and RSV 2 weeks ago. Holding diuretic which include Lasix, spironolactone and metolazone today. Giving some IV fluid    Assessment and Plan: * Enterocolitis Having bowel movement and no vomiting. - We will continue hydration with IV lactated ringer. - Starting on clear liquid -Continue with ceftriaxone and Flagyl for now  Sepsis due to undetermined organism (HCC) - This manifested by tachycardia and tachypnea, likely with concern of enterocolitis.  C. difficile and GI pathogen panel negative.  Preliminary blood cultures negative in 24-hour. -Continue with Rocephin and Flagyl -Continue with supportive care .  Hypothyroidism - Continue Synthroid.  Essential hypertension - We will continue Lopressor. -Holding home diuretic as she appears dry and normal BNP  Dyslipidemia - We will continue statin therapy.  Peripheral neuropathy - We will continue Neurontin.  GERD without esophagitis - We will continue PPI Therapy.  Gout - We will continue allopurinol. - Colchicine will be held off for diarrhea for now. - The patient has no current acute attack.  Chronic heart failure with preserved ejection fraction (HFpEF) (HCC) Last 2D echo in 2023 showed LVEF of 55% with grade 1 diastolic dysfunction, BNP within normal limit at 74 and clinically appears dry due to poor po intake. -Holding home diuretics which include Lasix, metolazone and spironolactone. -Monitor volume status closely as patient is getting IV fluid  Chronic respiratory failure with hypoxia (HCC) With history of ILD.  Seems stable on her baseline oxygen requirement  of 3 L. -Continue with home dose of prednisone 10 mg daily  Rheumatoid arthritis  (HCC) - We will continue her Plaquenil and leflunomide.      Subjective: Patient continued to have abdominal pain.  Appetite remained poor but no nausea or vomiting.  Had 3 loose BM since morning.  Physical Exam: Vitals:   07/01/23 0900 07/01/23 1000 07/01/23 1200 07/01/23 1201  BP: 123/87 116/77 111/69   Pulse: (!) 128 (!) 134 (!) 124   Resp: 19 (!) 21 15   Temp:    98.3 F (36.8 C)  TempSrc:      SpO2:  100%    Weight:      Height:       General.  Obese lady, in no acute distress. Pulmonary.  Lungs clear bilaterally, normal respiratory effort. CV.  Regular rate and rhythm, no JVD, rub or murmur. Abdomen.  Soft, nontender, nondistended, BS positive. CNS.  Alert and oriented .  No focal neurologic deficit. Extremities.  No edema, no cyanosis, pulses intact and symmetrical.  Data Reviewed: Prior data reviewed  Family Communication: Son at bedside  Disposition: Status is: Inpatient Remains inpatient appropriate because: Severity of illness  Planned Discharge Destination: Home with Home Health  DVT prophylaxis.  Lovenox Time spent: 50 minutes  This record has been created using Conservation officer, historic buildings. Errors have been sought and corrected,but may not always be located. Such creation errors do not reflect on the standard of care.   Author: Arnetha Courser, MD 07/01/2023 1:28 PM  For on call review [email or web address].

## 2023-07-02 DIAGNOSIS — A419 Sepsis, unspecified organism: Secondary | ICD-10-CM | POA: Diagnosis not present

## 2023-07-02 DIAGNOSIS — K529 Noninfective gastroenteritis and colitis, unspecified: Secondary | ICD-10-CM | POA: Diagnosis not present

## 2023-07-02 DIAGNOSIS — E039 Hypothyroidism, unspecified: Secondary | ICD-10-CM | POA: Diagnosis not present

## 2023-07-02 DIAGNOSIS — I10 Essential (primary) hypertension: Secondary | ICD-10-CM | POA: Diagnosis not present

## 2023-07-02 LAB — CBC
HCT: 36 % (ref 36.0–46.0)
Hemoglobin: 11 g/dL — ABNORMAL LOW (ref 12.0–15.0)
MCH: 30.3 pg (ref 26.0–34.0)
MCHC: 30.6 g/dL (ref 30.0–36.0)
MCV: 99.2 fL (ref 80.0–100.0)
Platelets: 214 10*3/uL (ref 150–400)
RBC: 3.63 MIL/uL — ABNORMAL LOW (ref 3.87–5.11)
RDW: 15.4 % (ref 11.5–15.5)
WBC: 7.1 10*3/uL (ref 4.0–10.5)
nRBC: 0 % (ref 0.0–0.2)

## 2023-07-02 LAB — BASIC METABOLIC PANEL
Anion gap: 9 (ref 5–15)
BUN: 18 mg/dL (ref 8–23)
CO2: 28 mmol/L (ref 22–32)
Calcium: 8.1 mg/dL — ABNORMAL LOW (ref 8.9–10.3)
Chloride: 104 mmol/L (ref 98–111)
Creatinine, Ser: 0.94 mg/dL (ref 0.44–1.00)
GFR, Estimated: >60 mL/min (ref >60)
Glucose, Bld: 120 mg/dL — ABNORMAL HIGH (ref 70–99)
Potassium: 2.9 mmol/L — ABNORMAL LOW (ref 3.5–5.1)
Sodium: 141 mmol/L (ref 135–145)

## 2023-07-02 LAB — MAGNESIUM: Magnesium: 1.6 mg/dL — ABNORMAL LOW (ref 1.7–2.4)

## 2023-07-02 LAB — CORTISOL-AM, BLOOD: Cortisol - AM: 16.7 ug/dL (ref 6.7–22.6)

## 2023-07-02 MED ORDER — POTASSIUM CHLORIDE 20 MEQ PO PACK
40.0000 meq | PACK | ORAL | Status: AC
  Administered 2023-07-02 (×2): 40 meq via ORAL
  Filled 2023-07-02 (×2): qty 2

## 2023-07-02 MED ORDER — LIDOCAINE 5 % EX PTCH
1.0000 | MEDICATED_PATCH | CUTANEOUS | Status: DC
  Administered 2023-07-02 – 2023-07-09 (×8): 1 via TRANSDERMAL
  Filled 2023-07-02 (×8): qty 1

## 2023-07-02 MED ORDER — MAGNESIUM SULFATE 4 GM/100ML IV SOLN
4.0000 g | Freq: Once | INTRAVENOUS | Status: AC
  Administered 2023-07-02: 4 g via INTRAVENOUS
  Filled 2023-07-02: qty 100

## 2023-07-02 MED ORDER — VITAMIN D 25 MCG (1000 UNIT) PO TABS
2000.0000 [IU] | ORAL_TABLET | Freq: Every day | ORAL | Status: DC
  Administered 2023-07-02 – 2023-07-10 (×9): 2000 [IU] via ORAL
  Filled 2023-07-02 (×9): qty 2

## 2023-07-02 MED ORDER — AZITHROMYCIN 250 MG PO TABS
250.0000 mg | ORAL_TABLET | ORAL | Status: DC
  Administered 2023-07-03 – 2023-07-10 (×4): 250 mg via ORAL
  Filled 2023-07-02 (×4): qty 1

## 2023-07-02 MED ORDER — DIPHENOXYLATE-ATROPINE 2.5-0.025 MG PO TABS: 1.0000 | ORAL_TABLET | Freq: Four times a day (QID) | ORAL | Status: DC | PRN

## 2023-07-02 MED ORDER — METOPROLOL SUCCINATE ER 50 MG PO TB24
125.0000 mg | ORAL_TABLET | Freq: Every day | ORAL | Status: DC
  Administered 2023-07-02 – 2023-07-03 (×2): 125 mg via ORAL
  Filled 2023-07-02 (×2): qty 1

## 2023-07-02 MED ORDER — LACTATED RINGERS IV SOLN: INTRAVENOUS | Status: AC

## 2023-07-02 NOTE — Evaluation (Signed)
Occupational Therapy Evaluation Patient Details Name: [name] MRN: [id number] DOB: [birth year] Today's Date: 07/02/2023   History of Present Illness   Pt is an 83 year old female admitted with entercolitis, sepsis  PMH significant for anxiety, asthma, PAD, COPD, coronary artery disease, gout, hypothyroidism, and hypertension; of note pt has  recent prolonged hospitalization at [facility] when she was admitted with concern of septic shock requiring pressors secondary to RSV, influenza A, urine cultures were positive for Klebsiella and E. Coli. Her hospital stay was also complicated with developing ileus which she was recovered before discharge.  From [facility]     Clinical Impressions Chart reviewed, pt greeted in bed, agreeable to OT evaluation. Pt is alert and oriented x4. PTA pt reports she was a rehab after hospitalization a few weeks ago, prior to that was amb household distances with rollator, performed ADLs with MOD I, assist for IADLs. Pt presents with deficits in strength, endurance, activity tolerance balance, affecting safe and optimal ADL completion. MOD A required for bed mobility, STS with MOD A with RW, MAX A for SPT to chair with RW. TOTAL A required for peri care in bed with pt rolling with MOD A. Pt is performing ADL below baseline, will benefit from acute OT to address deficits and to facilitate optimal ADL performance. Pt is left in bedside chair, all needs met. OT will follow.      If plan is discharge home, recommend the following:   A lot of help with walking and/or transfers;A lot of help with bathing/dressing/bathroom     Functional Status Assessment   Patient has had a recent decline in their functional status and demonstrates the ability to make significant improvements in function in a reasonable and predictable amount of time.     Equipment Recommendations   Other (comment) (defer)     Recommendations for Other Services          Precautions/Restrictions   Precautions Precautions: Fall     Mobility Bed Mobility Overal bed mobility: Needs Assistance Bed Mobility: Supine to Sit, Rolling Rolling: Mod assist, Max assist, Used rails   Supine to sit: Min assist          Transfers Overall transfer level: Needs assistance Equipment used: Rolling walker (2 wheels) Transfers: Sit to/from Stand Sit to Stand: Mod assist, From elevated surface                  Balance Overall balance assessment: Needs assistance Sitting-balance support: Feet supported Sitting balance-Leahy Scale: Fair     Standing balance support: Bilateral upper extremity supported, During functional activity Standing balance-Leahy Scale: Poor                             ADL either performed or assessed with clinical judgement   ADL Overall ADL's : Needs assistance/impaired     Grooming: Wash/dry face;Sitting;Set up           Upper Body Dressing : Minimal assistance;Sitting   Lower Body Dressing: Maximal assistance;Sitting/lateral leans Lower Body Dressing Details (indicate cue type and reason): socks Toilet Transfer: Maximal assistance;Rolling walker (2 wheels) Toilet Transfer Details (indicate cue type and reason): simulated to bedside chair SPT Toileting- Clothing Manipulation and Hygiene: Total assistance;Bed level Toileting - Clothing Manipulation Details (indicate cue type and reason): BM on bedpan             Vision Patient Visual Report: No change from baseline  Perception         Praxis         Pertinent Vitals/Pain Pain Assessment Pain Assessment: No/denies pain     Extremity/Trunk Assessment Upper Extremity Assessment Upper Extremity Assessment: Generalized weakness   Lower Extremity Assessment Lower Extremity Assessment: Generalized weakness       Communication Communication Communication: No apparent difficulties   Cognition Arousal: Alert Behavior During  Therapy: WFL for tasks assessed/performed                                         Cueing  General Comments   Cueing Techniques: Verbal cues;Tactile cues      Exercises Other Exercises Other Exercises: edu re: role of OT, role of rehab, discharge recommendations   Shoulder Instructions      Home Living Family/patient expects to be discharged to:: Skilled nursing facility Living Arrangements: Spouse/significant other Available Help at Discharge: Family;Available 24 hours/day Type of Home: House Home Access: Stairs to enter [employer] of Steps: 1 Entrance Stairs-Rails: None Home Layout: Two level;Able to live on main level with bedroom/bathroom     Bathroom Shower/Tub: Producer, television/film/video: Handicapped height Bathroom Accessibility: Yes   Home Equipment: Rollator (4 wheels);Wheelchair - Forensic psychologist (2 wheels);Shower seat   Additional Comments:per chart, 24/7 assist from family, lives with husband and son lives in house as well      Prior Functioning/Environment Prior Level of Function : Independent/Modified Independent             Mobility Comments: rollator in the house, wheelchair community distances ADLs Comments: MOD I for ADL, assist for IADLs prior to previous admission; now requires assist for all ADL/IADL    OT Problem List: Decreased strength;Decreased activity tolerance;Impaired balance (sitting and/or standing);Decreased knowledge of use of DME or AE;Decreased safety awareness   OT Treatment/Interventions: DME and/or AE instruction;Therapeutic activities;Self-care/ADL training;Balance training;Therapeutic exercise;Energy conservation;Patient/family education      OT Goals(Current goals can be found in the care plan section)   Acute Rehab OT Goals Patient Stated Goal: rehab OT Goal Formulation: With patient Time For Goal Achievement: 07/16/23 Potential to Achieve Goals: Good ADL Goals Pt Will  Perform Grooming: with modified independence;sitting Pt Will Perform Lower Body Dressing: with mod assist;sitting/lateral leans;sit to/from stand Pt Will Transfer to Toilet: with min assist;ambulating Pt Will Perform Toileting - Clothing Manipulation and hygiene: with mod assist;sitting/lateral leans;sit to/from stand   OT Frequency:  Min 1X/week    Co-evaluation              AM-PAC OT "6 Clicks" Daily Activity     Outcome Measure Help from another person eating meals?: A Little Help from another person taking care of personal grooming?: A Little Help from another person toileting, which includes using toliet, bedpan, or urinal?: Total Help from another person bathing (including washing, rinsing, drying)?: A Lot Help from another person to put on and taking off regular upper body clothing?: A Lot Help from another person to put on and taking off regular lower body clothing?: A Lot 6 Click Score: 13   End of Session Equipment Utilized During Treatment: Rolling walker (2 wheels);Oxygen Nurse Communication: Mobility status  Activity Tolerance: Patient tolerated treatment well Patient left: in chair;with call bell/phone within reach;with chair alarm set  OT Visit Diagnosis: Other abnormalities of gait and mobility (R26.89);Muscle weakness (generalized) (M62.81);Unsteadiness on feet (R26.81)  Time: 1610-9604 OT Time Calculation (min): 28 min Charges:  OT General Charges $OT Visit: 1 Visit OT Evaluation $OT Eval Moderate Complexity: 1 Mod  Oleta Mouse, OTD OTR/L  07/02/23, 12:37 PM

## 2023-07-02 NOTE — Evaluation (Signed)
Physical Therapy Evaluation Patient Details Name: [name] MRN: [id number] DOB: June 16, 1940 Today's Date: 07/02/2023  History of Present Illness  Pt is an 83 year old female admitted with entercolitis, sepsis  PMH significant for anxiety, asthma, PAD, COPD, coronary artery disease, gout, hypothyroidism, and hypertension; of note pt has  recent prolonged hospitalization at [facility] when she was admitted with concern of septic shock requiring pressors secondary to RSV, influenza A, urine cultures were positive for Klebsiella and E. Coli. Her hospital stay was also complicated with developing ileus which she was recovered before discharge.  From [facility]  Clinical Impression  Pt in recliner on arrival, needing to have another BM.  She initially struggled with attempts at standing, however with cuing for sequencing and set up she was able to rise from both the recliner and later the [facility] with only minA.  She was very quick to fatigue with all standing efforts and despite cuing would lean forward with forearms on the walker.  Her HR was in the 90s most of the time with breaching 100 with standing efforts, 2L O2 t/o session with SpO2 in the mid/high 90s.  She did have some lightheadedness with getting to BSC (BP down to 94/64).  Pt struggled with any prolonged standing efforts but did show good effort.  She will benefit from continued PT to address functional limitations.     If plan is discharge home, recommend the following: Two people to help with walking and/or transfers;A lot of help with bathing/dressing/bathroom;Assistance with cooking/housework;Direct supervision/assist for medications management;Direct supervision/assist for financial management;Assist for transportation;Help with stairs or ramp for entrance   Can travel by private vehicle   No    Equipment Recommendations None recommended by PT  Recommendations for Other Services       Functional Status Assessment Patient  has had a recent decline in their functional status and demonstrates the ability to make significant improvements in function in a reasonable and predictable amount of time.     Precautions / Restrictions Precautions Precautions: Fall Restrictions Weight Bearing Restrictions Per Provider Order: No      Mobility  Bed Mobility Overal bed mobility: Needs Assistance Bed Mobility: Sit to Supine       Sit to supine: Max assist        Transfers Overall transfer level: Needs assistance Equipment used: Rolling walker (2 wheels) Transfers: Sit to/from Stand Sit to Stand: Min assist           General transfer comment: She struggled with self selected rising attempts but with cuing for UE use/set up/weight shift she was able to rise w/ only light assist from both the recliner and bed side commode.  Pt very quick to fatigue with minimal activity, leaning forearms on walker and needing very close cuing/assist to insure safe transitions/steps    Ambulation/Gait               General Gait Details: Pt very quick to fatigue with static standing and minimal turning efforts, not safe/appropriate for ambulation this date  Stairs            Wheelchair Mobility     Tilt Bed    Modified Rankin (Stroke Patients Only)       Balance Overall balance assessment: Needs assistance Sitting-balance support: Bilateral upper extremity supported, Feet supported Sitting balance-Leahy Scale: Fair     Standing balance support: Bilateral upper extremity supported, During functional activity Standing balance-Leahy Scale: Poor Standing balance comment: tending to lean  on walker with forearms, unable to maintain upright                             Pertinent Vitals/Pain Pain Assessment Pain Assessment: 0-10 Pain Score: 3  Pain Location: abdominal pain    Home Living Family/patient expects to be discharged to:: Skilled nursing facility Living Arrangements:  Spouse/significant other Available Help at Discharge: Family;Available 24 hours/day Type of Home: House Home Access: Stairs to enter Entrance Stairs-Rails: None Entrance Stairs-Number of Steps: 1   Home Layout: Two level;Able to live on main level with bedroom/bathroom Home Equipment: Rollator (4 wheels);Wheelchair - Forensic psychologist (2 wheels);Shower seat Additional Comments: per prior documentation 24/7 assist available from husband and brother-in-law who lives with patient. aide M-F 3hr    Prior Function Prior Level of Function : Independent/Modified Independent             Mobility Comments: rollator in the house, wheelchair community distances ADLs Comments: MOD I for ADL, assist for IADLs prior to previous admission; now requires assist for all ADL/IADL     Extremity/Trunk Assessment   Upper Extremity Assessment Upper Extremity Assessment: Generalized weakness    Lower Extremity Assessment Lower Extremity Assessment: Generalized weakness       Communication   Communication Communication: No apparent difficulties    Cognition Arousal: Alert Behavior During Therapy: WFL for tasks assessed/performed   PT - Cognitive impairments: No apparent impairments                         Following commands: Intact (needing some extra reinforcement)       Cueing Cueing Techniques: Verbal cues, Tactile cues     General Comments General comments (skin integrity, edema, etc.): HR 110 bpm at rest, up to 116 bpm with mobility; spo2 >90% on 2 L via [place] throughout    Exercises     Assessment/Plan    PT Assessment Patient needs continued PT services  PT Problem List Decreased strength;Decreased activity tolerance;Decreased balance;Decreased mobility;Decreased cognition;Decreased knowledge of use of DME;Decreased safety awareness;Decreased knowledge of precautions;Cardiopulmonary status limiting activity;Obesity;Pain       PT Treatment Interventions DME  instruction;Gait training;Stair training;Functional mobility training;Therapeutic activities;Therapeutic exercise;Balance training;Neuromuscular re-education;Cognitive remediation;Patient/family education;Wheelchair mobility training;Modalities    PT Goals (Current goals can be found in the Care Plan section)  Acute Rehab PT Goals Patient Stated Goal: get stronger PT Goal Formulation: With patient/family Time For Goal Achievement: 07/15/23 Potential to Achieve Goals: Fair    Frequency Min 1X/week     Co-evaluation               AM-PAC PT "6 Clicks" Mobility  Outcome Measure Help needed turning from your back to your side while in a flat bed without using bedrails?: A Lot Help needed moving from lying on your back to sitting on the side of a flat bed without using bedrails?: A Lot Help needed moving to and from a bed to a chair (including a wheelchair)?: A Lot Help needed standing up from a chair using your arms (e.g., wheelchair or bedside chair)?: A Lot Help needed to walk in hospital room?: Total Help needed climbing 3-5 steps with a railing? : Total 6 Click Score: 10    End of Session Equipment Utilized During Treatment: Oxygen Activity Tolerance: Patient limited by fatigue Patient left: with bed alarm set;with nursing/sitter in room;in bed Nurse Communication: Mobility status PT Visit Diagnosis: Difficulty in walking,  not elsewhere classified (R26.2);Muscle weakness (generalized) (M62.81)    Time: 0981-1914 PT Time Calculation (min) (ACUTE ONLY): 31 min   Charges:   PT Evaluation $PT Eval Low Complexity: 1 Low PT Treatments $Therapeutic Activity: 8-22 mins PT General Charges $$ ACUTE PT VISIT: 1 Visit         Malachi Pro, DPT 07/02/2023, 12:49 PM

## 2023-07-02 NOTE — Assessment & Plan Note (Signed)
Having diarrhea and no vomiting. - We will continue hydration with IV lactated ringer for another day as she is still appears dry - Advancing diet to full liquid -Continue with ceftriaxone and Flagyl for now -Supportive care

## 2023-07-02 NOTE — Plan of Care (Signed)
Patient alert with moments of confusion. Patient continues to have tachycardia with heart rate 110's-130's. Patient remained asymptomatic. On call provider contacted for suggestions. EKG obtained. No further orders written/given. Will continue to monitor.   Problem: Fluid Volume: Goal: Hemodynamic stability will improve Outcome: Not Progressing   Problem: Clinical Measurements: Goal: Diagnostic test results will improve Outcome: Not Progressing   Problem: Clinical Measurements: Goal: Signs and symptoms of infection will decrease Outcome: Not Progressing   Problem: Respiratory: Goal: Ability to maintain adequate ventilation will improve Outcome: Not Progressing   Problem: Clinical Measurements: Goal: Ability to maintain clinical measurements within normal limits will improve Outcome: Not Progressing   Problem: Clinical Measurements: Goal: Diagnostic test results will improve Outcome: Not Progressing

## 2023-07-02 NOTE — Progress Notes (Signed)
Progress Note   Patient: [name]:[id number] DOB: 06-Nov-1940 DOA: 06/30/2023     1 DOS: the patient was seen and examined on 07/02/2023   Brief hospital course: Taken from H&P.  Shaneque F Stricker is a 83 y.o. female with medical history significant for anxiety, asthma, PAD, COPD, coronary artery disease, gout, hypothyroidism, and hypertension, who presented to the emergency room with acute onset of generalized abdominal pain with associated distention.  The patient has a chronic rectal tube due to sigmoid stricture and has been noticing diminished output.  She had a watery bowel movement in the ER with no melena or bright red bleeding per rectum.  She is to diarrhea over the last few weeks.   Patient had a recent prolonged hospitalization at [facility] when she was admitted with concern of septic shock requiring pressors secondary to RSV, influenza A, urine cultures were positive for Klebsiella and E. coli and she received appropriate management.  Her hospital stay was also complicated with developing ileus which she was recovered before discharge.  Under sedation she had tachycardia and mild tachypnea, rest of the vitals within normal limits, labs pertinent for BUN 25 creatinine 1.15, WBC 11. EKG with sinus tachycardia, left anterior fascicular block, LVH and T wave inversion laterally. CT abdomen and pelvis some wall thickening and mucosal enhancement concerning for right lower quadrant enteritis and mild wall thickening and enhancement of the colon consistent with colitis. Incidental finding of nonobstructing right kidney stones. Bronchial wall thickening and mild mucous plugging in the right lower lobe with peribronchiolar hazy density with could be inflammatory.  Patient was started on ceftriaxone and Flagyl  2/12: Remained mildly tachycardic, leukocytosis resolved but all cell line decreased, creatinine with some improvement to 1.09, GFR improved to 51, baseline GFR more than  60, patient meeting the criteria for AKI.  BNP 74 Chest x-ray with streaky bibasilar opacities, may represent atelectasis versus pneumonia.  C. difficile and GI pathogen panel negative Respiratory panel for COVID, influenza and RSV negative.  Patient has positive influenza A and RSV 2 weeks ago. Holding diuretic which include Lasix, spironolactone and metolazone today. Giving some IV fluid.  2/13: Vital stable, labs with hypokalemia 2.9 which is being repleted, magnesium at 1.6-being repleted, AKI resolved. Continue to have abdominal pain and diarrhea-Lomotil added. PT is recommending SNF   Assessment and Plan: * Enterocolitis Having diarrhea and no vomiting. - We will continue hydration with IV lactated ringer for another day as she is still appears dry - Advancing diet to full liquid -Continue with ceftriaxone and Flagyl for now -Supportive care  Sepsis due to undetermined organism [facility]) - This manifested by tachycardia and tachypnea, likely with concern of enterocolitis.  C. difficile and GI pathogen panel negative.  Preliminary blood cultures negative in 24-hour. -Continue with Rocephin and Flagyl -Continue with supportive care .  Hypothyroidism - Continue Synthroid.  Essential hypertension - We will continue Lopressor. -Holding home diuretic as she appears dry and normal BNP  Dyslipidemia - We will continue statin therapy.  Peripheral neuropathy - We will continue Neurontin.  GERD without esophagitis - We will continue PPI Therapy.  Gout - We will continue allopurinol. - Colchicine will be held off for diarrhea for now. - The patient has no current acute attack.  Chronic heart failure with preserved ejection fraction (HFpEF) (HCC) Last 2D echo in 2023 showed LVEF of 55% with grade 1 diastolic dysfunction, BNP within normal limit at 74 and clinically appears dry due to poor  po intake. -Holding home diuretics which include Lasix, metolazone and  spironolactone. -Monitor volume status closely as patient is getting IV fluid  Chronic respiratory failure with hypoxia (HCC) With history of ILD.  Seems stable on her baseline oxygen requirement of 3 L. -Continue with home dose of prednisone 10 mg daily  Rheumatoid arthritis (HCC) - We will continue her Plaquenil and leflunomide.   Subjective: Patient was tolerating clear liquid diet, no nausea or vomiting.  Continue to have crampy abdominal pain and had 3 loose bowel movement since morning.  Physical Exam: Vitals:   07/01/23 2138 07/01/23 2330 07/02/23 0345 07/02/23 0823  BP:  112/74 122/79 130/76  Pulse: (!) 131 (!) 119 (!) 120 (!) 103  Resp:  18 18 16   Temp:   98 F (36.7 C) 98.5 F (36.9 C)  TempSrc:      SpO2:  100% 98% 98%  Weight:      Height:       General.  Obese elderly lady, in no acute distress. Pulmonary.  Lungs clear bilaterally, normal respiratory effort. CV.  Regular rate and rhythm, no JVD, rub or murmur. Abdomen.  Soft, nontender, nondistended, BS positive. CNS.  Alert and oriented .  No focal neurologic deficit. Extremities.  No edema, no cyanosis, pulses intact and symmetrical.  Data Reviewed: Prior data reviewed  Family Communication: Talked with daughter on phone.  Disposition: Status is: Inpatient Remains inpatient appropriate because: Severity of illness  Planned Discharge Destination: Home with Home Health  DVT prophylaxis.  Lovenox Time spent: 45 minutes  This record has been created using Conservation officer, historic buildings. Errors have been sought and corrected,but may not always be located. Such creation errors do not reflect on the standard of care.   Author: Arnetha Courser, MD 07/02/2023 2:48 PM  For on call review [email or web address].

## 2023-07-02 NOTE — Plan of Care (Signed)
Problem: Fluid Volume: Goal: Hemodynamic stability will improve Outcome: Progressing   Problem: Clinical Measurements: Goal: Diagnostic test results will improve Outcome: Progressing Goal: Signs and symptoms of infection will decrease Outcome: Progressing   Problem: Respiratory: Goal: Ability to maintain adequate ventilation will improve Outcome: Progressing   Problem: Education: Goal: Knowledge of General Education information will improve Description: Including pain rating scale, medication(s)/side effects and non-pharmacologic comfort measures Outcome: Progressing   Problem: Health Behavior/Discharge Planning: Goal: Ability to manage health-related needs will improve Outcome: Progressing   Problem: Clinical Measurements: Goal: Ability to maintain clinical measurements within normal limits will improve Outcome: Progressing Goal: Will remain free from infection Outcome: Progressing Goal: Diagnostic test results will improve Outcome: Progressing Goal: Respiratory complications will improve Outcome: Progressing Goal: Cardiovascular complication will be avoided Outcome: Progressing   Problem: Activity: Goal: Risk for activity intolerance will decrease Outcome: Progressing   Problem: Nutrition: Goal: Adequate nutrition will be maintained Outcome: Progressing   Problem: Coping: Goal: Level of anxiety will decrease Outcome: Progressing   Problem: Elimination: Goal: Will not experience complications related to bowel motility Outcome: Progressing Goal: Will not experience complications related to urinary retention Outcome: Progressing   Problem: Pain Managment: Goal: General experience of comfort will improve and/or be controlled Outcome: Progressing   Problem: Safety: Goal: Ability to remain free from injury will improve Outcome: Progressing   Problem: Skin Integrity: Goal: Risk for impaired skin integrity will decrease Outcome: Progressing

## 2023-07-03 DIAGNOSIS — K529 Noninfective gastroenteritis and colitis, unspecified: Secondary | ICD-10-CM | POA: Diagnosis not present

## 2023-07-03 LAB — CBC
HCT: 32.7 % — ABNORMAL LOW (ref 36.0–46.0)
Hemoglobin: 10.2 g/dL — ABNORMAL LOW (ref 12.0–15.0)
MCH: 30.4 pg (ref 26.0–34.0)
MCHC: 31.2 g/dL (ref 30.0–36.0)
MCV: 97.3 fL (ref 80.0–100.0)
Platelets: 185 10*3/uL (ref 150–400)
RBC: 3.36 MIL/uL — ABNORMAL LOW (ref 3.87–5.11)
RDW: 15.4 % (ref 11.5–15.5)
WBC: 5.2 10*3/uL (ref 4.0–10.5)
nRBC: 0 % (ref 0.0–0.2)

## 2023-07-03 LAB — BASIC METABOLIC PANEL
Anion gap: 6 (ref 5–15)
BUN: 15 mg/dL (ref 8–23)
CO2: 28 mmol/L (ref 22–32)
Calcium: 7.9 mg/dL — ABNORMAL LOW (ref 8.9–10.3)
Chloride: 106 mmol/L (ref 98–111)
Creatinine, Ser: 0.73 mg/dL (ref 0.44–1.00)
GFR, Estimated: >60 mL/min (ref >60)
Glucose, Bld: 99 mg/dL (ref 70–99)
Potassium: 3.1 mmol/L — ABNORMAL LOW (ref 3.5–5.1)
Sodium: 140 mmol/L (ref 135–145)

## 2023-07-03 LAB — MAGNESIUM: Magnesium: 2.3 mg/dL (ref 1.7–2.4)

## 2023-07-03 LAB — TSH: TSH: 1.371 u[IU]/mL (ref 0.350–4.500)

## 2023-07-03 MED ORDER — POTASSIUM CHLORIDE CRYS ER 20 MEQ PO TBCR
40.0000 meq | EXTENDED_RELEASE_TABLET | Freq: Once | ORAL | Status: AC
  Administered 2023-07-03: 40 meq via ORAL
  Filled 2023-07-03: qty 2

## 2023-07-03 MED ORDER — POTASSIUM CHLORIDE 20 MEQ PO PACK
40.0000 meq | PACK | ORAL | Status: DC
  Administered 2023-07-03: 40 meq via ORAL
  Filled 2023-07-03 (×2): qty 2

## 2023-07-03 NOTE — Consult Note (Signed)
[facility] Liaison Consult Note    07/03/2023  [name] [birth year] [id number]  Insurance: Medicare ACO REACH   Primary Care Provider: [name], MD, [place] [facility], this provider is listed for the transition of care follow up appointments  and [facility] calls   *Coverage review for [name], [facility] Liaison,  This [facility] Liaison screened the patient remotely at [facility].      The patient was screened for 7 and 30 day readmission hospitalization with noted extreme high risk score for unplanned readmission risk 2 hospital admissions in 6 months.  The patient was assessed for potential [facility] Coordination service needs for post hospital transition for care coordination. Review of patient's electronic medical record reveals patient is recently from ST SNF and readmitted for distended abdomin and chronic rectal tube note noted.  Plan: [facility] Liaison will continue to follow progress and disposition to asess for post hospital community care coordination/management needs.  Referral request for community care coordination: If patient goes to a [facility] SNF for ST rehab, then can have [facility] PAC RN to assess for return to community needs, if applicable.   [facility] Community Care, Population Health does not replace or interfere with any arrangements made by the Inpatient Transition of Care team.   For questions contact:   Joan Shanks, RN, BSN, CCM   [facility], [facility] Liaison Direct Dial: [phone] or secure chat Email: [email or web address]

## 2023-07-03 NOTE — Plan of Care (Signed)
Problem: Fluid Volume: Goal: Hemodynamic stability will improve Outcome: Progressing   Problem: Clinical Measurements: Goal: Diagnostic test results will improve Outcome: Progressing Goal: Signs and symptoms of infection will decrease Outcome: Progressing   Problem: Respiratory: Goal: Ability to maintain adequate ventilation will improve Outcome: Progressing   Problem: Education: Goal: Knowledge of General Education information will improve Description: Including pain rating scale, medication(s)/side effects and non-pharmacologic comfort measures Outcome: Progressing   Problem: Health Behavior/Discharge Planning: Goal: Ability to manage health-related needs will improve Outcome: Progressing   Problem: Clinical Measurements: Goal: Ability to maintain clinical measurements within normal limits will improve Outcome: Progressing Goal: Will remain free from infection Outcome: Progressing Goal: Diagnostic test results will improve Outcome: Progressing Goal: Respiratory complications will improve Outcome: Progressing Goal: Cardiovascular complication will be avoided Outcome: Progressing   Problem: Activity: Goal: Risk for activity intolerance will decrease Outcome: Progressing   Problem: Nutrition: Goal: Adequate nutrition will be maintained Outcome: Progressing   Problem: Coping: Goal: Level of anxiety will decrease Outcome: Progressing   Problem: Elimination: Goal: Will not experience complications related to bowel motility Outcome: Progressing Goal: Will not experience complications related to urinary retention Outcome: Progressing

## 2023-07-03 NOTE — Progress Notes (Signed)
Physical Therapy Treatment Patient Details Name: [name] MRN: [id number] DOB: Apr 02, 1941 Today's Date: 07/03/2023   History of Present Illness Pt is an 83 year old female admitted with entercolitis, sepsis  PMH significant for anxiety, asthma, PAD, COPD, coronary artery disease, gout, hypothyroidism, and hypertension; of note pt has  recent prolonged hospitalization at [facility] when she was admitted with concern of septic shock requiring pressors secondary to RSV, influenza A, urine cultures were positive for Klebsiella and E. Coli. Her hospital stay was also complicated with developing ileus which she was recovered before discharge.  From [facility]    PT Comments  Pt was sitting in recliner with RN at bedside about to assist pt back to bed." Ive been up for ~ 3 hours", per pt. Pt was agreeable to session but overall limited by fatigue. BP in sitting 133/88. No symptoms of dizziness throughout today's session. Recommend +2 assistance for further gait attempt. Pt only willing to take a few steps form recliner back to EOB. She did however fully participate in HEP/strengthening exercises once back in bed. Pt remains far form her baseline. DC recs remain appropriate. Acute PT will continue to follow per current POC.     If plan is discharge home, recommend the following: A lot of help with walking and/or transfers;A lot of help with bathing/dressing/bathroom;Assistance with cooking/housework;Direct supervision/assist for medications management;Assist for transportation;Direct supervision/assist for financial management;Help with stairs or ramp for entrance;Supervision due to cognitive status     Equipment Recommendations  None recommended by PT       Precautions / Restrictions Precautions Precautions: Fall Recall of Precautions/Restrictions: Intact Restrictions Weight Bearing Restrictions Per Provider Order: No     Mobility  Bed Mobility Overal bed mobility: Needs  Assistance Bed Mobility: Sit to Supine  Sit to supine: Mod assist, Max assist General bed mobility comments: pt required extensive assistance to progress BLEs into bed from EOB short sit    Transfers Overall transfer level: Needs assistance Equipment used: Rolling walker (2 wheels) Transfers: Sit to/from Stand Sit to Stand: Mod assist  General transfer comment: pt stood from recliner with mod assist of one. vcs for handplacement and improved fwd wt shift. once in standing. CGA for safety only    Ambulation/Gait Ambulation/Gait assistance: Contact guard assist Gait Distance (Feet): 3 Feet Assistive device: Rolling walker (2 wheels) Gait Pattern/deviations: Step-to pattern Gait velocity: decreased  General Gait Details: " I will try to walk next session I'm just too tired right now." Pt was easily able to take steps once in standing. self limiting distance wise   Balance Overall balance assessment: Needs assistance Sitting-balance support: Feet supported Sitting balance-Leahy Scale: Good Sitting balance - Comments: no LOB while seated EOB   Standing balance support: Bilateral upper extremity supported, During functional activity, Reliant on assistive device for balance Standing balance-Leahy Scale: Fair Standing balance comment: no LOB during minimal standing activity/time       Communication Communication Communication: No apparent difficulties  Cognition Arousal: Alert Behavior During Therapy: WFL for tasks assessed/performed   PT - Cognitive impairments: No apparent impairments    PT - Cognition Comments: pt is A and Agreeable. pleasant throughout session and motivated to improve to return to home environment Following commands: Intact      Cueing Cueing Techniques: Verbal cues, Tactile cues  Exercises General Exercises - Lower Extremity Ankle Circles/Pumps: AROM, 10 reps Quad Sets: AROM, 10 reps Gluteal Sets: AROM, 10 reps Short Arc Quad: AROM, 10 reps Heel  Slides: AROM, 10 reps Hip ABduction/ADduction: AROM, 10 reps    General Comments General comments (skin integrity, edema, etc.): author issued HEP handout for supine exercioses to promote strengthening and increased activity tolerance      Pertinent Vitals/Pain Pain Assessment Pain Assessment: No/denies pain Pain Score: 0-No pain     PT Goals (current goals can now be found in the care plan section) Acute Rehab PT Goals Patient Stated Goal: get stronger so I can go home from rehab quickly Progress towards PT goals: Progressing toward goals    Frequency    Min 1X/week       AM-PAC PT "6 Clicks" Mobility   Outcome Measure  Help needed turning from your back to your side while in a flat bed without using bedrails?: A Little Help needed moving from lying on your back to sitting on the side of a flat bed without using bedrails?: A Lot Help needed moving to and from a bed to a chair (including a wheelchair)?: A Lot Help needed standing up from a chair using your arms (e.g., wheelchair or bedside chair)?: A Lot Help needed to walk in hospital room?: A Lot Help needed climbing 3-5 steps with a railing? : Total 6 Click Score: 12    End of Session Equipment Utilized During Treatment: Oxygen (2L with sao2 > 98%. pt endorses wearing 3 L o2 at times at home but mostly only at night) Activity Tolerance: Patient tolerated treatment well;Patient limited by fatigue Patient left: in bed;with call bell/phone within reach;with bed alarm set;with nursing/sitter in room;with family/visitor present Nurse Communication: Mobility status PT Visit Diagnosis: Difficulty in walking, not elsewhere classified (R26.2);Muscle weakness (generalized) (M62.81)     Time: 1610-9604 PT Time Calculation (min) (ACUTE ONLY): 12 min  Charges:    $Therapeutic Activity: 8-22 mins PT General Charges $$ ACUTE PT VISIT: 1 Visit                     Jetta Lout PTA 07/03/23, 3:56 PM

## 2023-07-03 NOTE — Consult Note (Signed)
GI Inpatient Consult Note  Reason for Consult: Colitis, sigmoid colon stricture    Attending Requesting Consult: Dr. Shonna Chock  History of Present Illness: [name] is a 83 y.o. female seen for evaluation of enterocolitis at the request of hospitalist - Dr. Shonna Chock. Patient has a PMH of HTN, HLD, hypothyroidism, gout, chronic pain syndrome, CAD, HFpEF, bronchiectasis on chronic O2 supplementation, ILD, hx of DVT/PE on chronic anticoagulation, asthma, anxiety, rheumatoid arthritis on chronic steroids, GERD without esophagitis, and PAD. She presented to the [facility] ED 2/11 for chief complaint of acute onset of generalized abdominal pain with associated distention. She had recent prolonged hospitalization at [facility] where she was admitted for septic shock requiring pressors 2/2 RSV/Influenza A with UTI. Admission was c/b developing ileus thought to be 2/2 combination of recent severe illness, hypokalemia, chronic narcotics, and decreased mobility. GI was consulted at this time and recommended trial of oral pyridostigmine which did appear to be helpful. CT performed on 2/11 after admission to [facility] commented on wall thickening and mucosal enhancement concerning for RLQ enteritis and colitis. She was started on Ceftriaxone and Flagyl. GI PCR and c difficile testing was negative. GI consulted today for concerns of ongoing diarrhea and colitis.   Patient seen and examined this afternoon resting sitting up in bedside chair. She reports she has had one large "blowout" BM this morning. She continues to have intermittent, generalized abdominal pain described as cramping in nature. She is not currently in any abdominal pain. She denies any nausea or vomiting. She follows as outpatient by [facility]. Colonoscopy 03/2022 with Dr. Erick Blinks commented on diverticular stricture in mid-sigmoid colon, left sided diverticulosis, and two subcentimeter polyps not removed at time of procedure. She was referred to  General Surgery (Dr. Axel Filler) at [facility] 04/2022 and was told she should be managed conservatively and surgery was deferred. She has hx of chronic constipation and takes either Linzess, Miralax, or Senna to help her have bowel movements. Since her RSV/Influenza she has had a hard time with diarrhea. She denies any hematochezia or melena.    Summary of GI Procedures:  CSY 04/14/2022 - stricture in mid-sigmoid colon felt 2/2 diverticulosis was non-obstructing though ultra slim colonoscope required to traverse without evidence of neoplasm/malignancy, left-sided diverticulosis, one 8 mm polyp not removed from ascending colon, one 3 mm polyp not removed from descending colon  Past Medical History:  Past Medical History:  Diagnosis Date   Acute on chronic diastolic (congestive) heart failure (HCC)    Anemia    iron deficiency anemia - secondary to blood loss ( chronic)    Anxiety    Arthritis    endstage changes bilateral knees/bilateral ankles.    Asthma    Carotid artery occlusion    Chronic fatigue    Chronic kidney disease    Closed left hip fracture (HCC)    Clotting disorder (HCC)    pt denies this   Contusion of left knee    due to fall 1/14.   COPD (chronic obstructive pulmonary disease) (HCC)    pulmonary fibrosis   Coronary artery disease    Depression, reactive    Diabetes mellitus    type II    Diastolic dysfunction    Difficulty in walking    Family history of heart disease    Generalized muscle weakness    Gout    High cholesterol    History of falling    Hypertension  Hypothyroidism    Interstitial lung disease (HCC)    Meningioma of left sphenoid wing involving cavernous sinus (HCC) 02/17/2012   Continue diplopia, left eye pain and left headaches.     Morbid obesity (HCC)    Neuromuscular disorder (HCC)    diabetic neuropathy    Normal coronary arteries    cardiac catheterization performed  10/31/14   Pneumonia    RA (rheumatoid  arthritis) (HCC)    has been off methotreaxte since 10/13.   Spinal stenosis of lumbar region    Thyroid disease    Unspecified lack of coordination    URI (upper respiratory infection)     Problem List: Patient Active Problem List   Diagnosis Date Noted   Enterocolitis 06/30/2023   Sepsis due to undetermined organism (HCC) 06/30/2023   Hypothyroidism 06/30/2023   GERD without esophagitis 06/30/2023   Peripheral neuropathy 06/30/2023   RSV (respiratory syncytial virus pneumonia) 06/26/2023   Flu 06/26/2023   Ileus (HCC) 06/23/2023   Septic shock (HCC) 06/13/2023   Lower GI bleed 12/16/2022   BRBPR (bright red blood per rectum) 12/15/2022   [name] 09/08/2022   Abnormal CT scan, sigmoid colon 04/14/2022   Colon stricture (HCC) 04/14/2022   Diverticulosis of colon without hemorrhage 04/14/2022   Pulmonary embolism (HCC) 11/15/2021   Tachycardia 10/22/2021   Pulmonary HTN (HCC)    Unspecified protein-calorie malnutrition (HCC) 04/05/2021   Pressure injury of skin 03/31/2021   UTI (urinary tract infection) 03/31/2021   Acute encephalopathy 03/30/2021   Thrombocytopenia (HCC) 03/30/2021   Diabetic neuropathy (HCC) 05/24/2019   Hypokalemia 04/21/2019   Anticoagulated 06/25/2018   Chronic pain 06/25/2018   CRI (chronic renal insufficiency), stage 3 (moderate) (HCC) 06/25/2018   Drug-induced constipation 06/18/2018   DVT (deep venous thrombosis) (HCC) 06/03/2018   Primary osteoarthritis of both knees 05/26/2018   Body mass index (BMI) of 50-59.9 in adult (HCC) 05/26/2018   Acquired hypothyroidism 05/26/2018   Chronic bilateral low back pain without sciatica 02/17/2018   Astigmatism with presbyopia, bilateral 03/26/2017   Cortical age-related cataract of both eyes 03/26/2017   Family history of glaucoma 03/26/2017   Long term current use of oral hypoglycemic drug 03/26/2017   Nuclear sclerotic cataract of both eyes 03/26/2017   Pain in  left hip 02/18/2017   Closed nondisplaced intertrochanteric fracture of left femur with delayed healing 01/14/2017   Gout 11/29/2016   Depression 11/29/2016   Closed left hip fracture (HCC) 11/29/2016   GERD (gastroesophageal reflux disease) 11/29/2016   Closed intertrochanteric fracture of hip, left, initial encounter (HCC) 11/29/2016   Physical deconditioning 07/31/2015   Pulmonary fibrosis, postinflammatory (HCC) 07/31/2015   Bronchiectasis without complication (HCC) 07/31/2015   Tendinitis of left rotator cuff 06/13/2015   Chronic heart failure with preserved ejection fraction (HFpEF) (HCC) 04/21/2015   Acute kidney injury superimposed on chronic kidney disease (HCC) 04/17/2015   Chronic respiratory failure with hypoxia (HCC) 02/09/2015   Dyspnea and respiratory abnormality 01/03/2015   Normal coronary arteries 11/02/2014   Morbid obesity (HCC) 10/30/2014   Dysphagia 08/21/2014   Chronic fatigue 06/21/2014   DOE (dyspnea on exertion) 05/24/2014   Primary osteoarthritis of left knee 04/26/2014   High risk medication use 04/17/2014   Aphasia 02/12/2014   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) 01/18/2014   Lumbar facet arthropathy 01/18/2014   Bilateral edema of lower extremity 05/30/2013   Chronic cough 05/24/2013   ILD (interstitial lung disease) (HCC) 04/10/2013  Spinal stenosis of lumbar region with radiculopathy 07/12/2012   Inability to walk 07/12/2012   Meningioma of left sphenoid wing involving cavernous sinus (HCC) 02/17/2012   Chest pain of uncertain etiology 01/28/2012   Rheumatoid arthritis (HCC) 01/28/2012   Primary osteoarthritis of right knee 01/28/2012   Dyslipidemia    Thyroid disease    Essential hypertension     Past Surgical History: Past Surgical History:  Procedure Laterality Date   BRAIN SURGERY     Gamma knife 10/13. Needs repeat spring  '14   CARDIAC CATHETERIZATION N/A 10/31/2014   Procedure: Right/Left Heart Cath and Coronary  Angiography;  Surgeon: Corky Crafts, MD;  Location: [facility] INVASIVE CV LAB;  Service: Cardiovascular;  Laterality: N/A;   COLONOSCOPY WITH PROPOFOL N/A 04/14/2022   Procedure: COLONOSCOPY WITH PROPOFOL;  Surgeon: Beverley Fiedler, MD;  Location: [facility] ENDOSCOPY;  Service: Gastroenterology;  Laterality: N/A;   CYST EXCISION     2 on face   CYST REMOVAL NECK     ESOPHAGOGASTRODUODENOSCOPY (EGD) WITH PROPOFOL N/A 09/14/2014   Procedure: ESOPHAGOGASTRODUODENOSCOPY (EGD) WITH PROPOFOL;  Surgeon: Louis Meckel, MD;  Location: [facility] ENDOSCOPY;  Service: Endoscopy;  Laterality: N/A;   INCISION AND DRAINAGE HIP Left 01/16/2017   Procedure: IRRIGATION AND DEBRIDEMENT LEFT HIP;  Surgeon: Kathryne Hitch, MD;  Location: [facility] ORS;  Service: Orthopedics;  Laterality: Left;   INTRAMEDULLARY (IM) NAIL INTERTROCHANTERIC Left 11/29/2016   Procedure: INTRAMEDULLARY (IM) NAIL INTERTROCHANTRIC;  Surgeon: Kathryne Hitch, MD;  Location: [facility] OR;  Service: Orthopedics;  Laterality: Left;   OVARY SURGERY     RIGHT HEART CATH N/A 08/12/2021   Procedure: RIGHT HEART CATH;  Surgeon: Runell Gess, MD;  Location: [facility] INVASIVE CV LAB;  Service: Cardiovascular;  Laterality: N/A;   SHOULDER SURGERY Left    TONSILLECTOMY  age 72   VAGINAL HYSTERECTOMY     VIDEO BRONCHOSCOPY Bilateral 05/31/2013   Procedure: VIDEO BRONCHOSCOPY WITHOUT FLUORO;  Surgeon: Kalman Shan, MD;  Location: [facility] ENDOSCOPY;  Service: Cardiopulmonary;  Laterality: Bilateral;   video bronscoscopy  05/19/1998   lung    Allergies: Allergies  Allergen Reactions   Codeine Swelling and Other (See Comments)    Facial swelling, Chest pain, and swelling in legs    Infliximab Anaphylaxis    (REMICADE) "sent me into shock"    Lisinopril Swelling and Rash    Face and neck swelling    Ofev [Nintedanib] Diarrhea    Home Medications: Medications Prior to Admission  Medication Sig Dispense Refill Last Dose/Taking   acetaminophen (TYLENOL)  650 MG suppository Place 1 suppository (650 mg total) rectally every 4 (four) hours as needed for moderate pain (pain score 4-6) or fever.   Taking As Needed   albuterol (PROVENTIL) (2.5 MG/3ML) 0.083% nebulizer solution Take 3 mLs (2.5 mg total) by nebulization every 6 (six) hours as needed for wheezing. 75 mL 0 Taking As Needed   albuterol (VENTOLIN HFA) 108 (90 Base) MCG/ACT inhaler Inhale 2 puffs into the lungs as needed for wheezing or shortness of breath.   Taking As Needed   allopurinol (ZYLOPRIM) 300 MG tablet Take 1 tablet (300 mg total) by mouth daily. 30 tablet 0 06/30/2023 at  9:00 AM   Artificial Tear Ointment (DRY EYES OP) Place 2 drops into both eyes as needed (dry eyes).   Taking As Needed   atorvastatin (LIPITOR) 10 MG tablet Take 1 tablet (10 mg total) by mouth daily. (Patient taking differently: Take 10 mg by mouth at  bedtime.) 90 tablet 3 06/29/2023   azithromycin (ZITHROMAX) 250 MG tablet Take 1 tablet (250 mg total) by mouth every Monday, Wednesday, and Friday. Take every Monday, Wednesday, Friday. 12 tablet 11 06/29/2023   Calcium Carb-Cholecalciferol (CALCIUM + D3 PO) Take 2 tablets by mouth daily.   06/30/2023 at  8:00 AM   Camphor-Menthol-Methyl Sal (SALONPAS EX) Apply 1 patch topically as needed (shoulder and leg pain).   Taking As Needed   Cholecalciferol (VITAMIN D3) 50 MCG (2000 UT) capsule Take 2,000 Units by mouth daily.   06/30/2023 at  9:00 AM   colchicine 0.6 MG tablet Take 1 tablet (0.6 mg total) by mouth daily as needed (gout flares). 30 tablet 0 Taking As Needed   diclofenac Sodium (VOLTAREN) 1 % GEL Apply 2 g topically 3 (three) times daily. Apply to both hands and knees (Patient taking differently: Apply 1 Application topically in the morning, at noon, and at bedtime.) 100 g 1 06/30/2023   docusate sodium (COLACE) 100 MG capsule Take 2 capsules (200 mg total) by mouth 2 (two) times daily. Hold for diarrhea.   06/30/2023 Morning   donepezil (ARICEPT) 10 MG tablet Take 1  tablet (10 mg total) by mouth at bedtime. 90 tablet 4 06/29/2023   DULoxetine (CYMBALTA) 60 MG capsule Take 1 capsule (60 mg total) by mouth daily. 90 capsule 2 06/29/2023   fluticasone (FLONASE) 50 MCG/ACT nasal spray Place 1 spray into both nostrils daily. 16 g 3 06/30/2023   furosemide (LASIX) 20 MG tablet Take 3 tablets (60 mg total) by mouth daily. (Patient taking differently: Take 80 mg by mouth daily.)   06/30/2023   gabapentin (NEURONTIN) 600 MG tablet Take 0.5 tablets (300 mg total) by mouth 2 (two) times daily. (Patient taking differently: Take 600 mg by mouth 3 (three) times daily.)   06/30/2023   guaiFENesin (MUCINEX) 600 MG 12 hr tablet Take 2 tablets (1,200 mg total) by mouth 2 (two) times daily. 120 tablet 0 06/30/2023   hydrALAZINE (APRESOLINE) 25 MG tablet Take 25 mg by mouth 2 (two) times daily.   Taking   hydroxychloroquine (PLAQUENIL) 200 MG tablet Take 1 tablet (200 mg total) by mouth daily. 30 tablet 0 06/30/2023   leflunomide (ARAVA) 20 MG tablet Take 1 tablet (20 mg total) by mouth daily. 30 tablet 0 06/30/2023 at  9:00 AM   levothyroxine (SYNTHROID) 50 MCG tablet Take 1 tablet (50 mcg total) by mouth daily. 90 tablet 3 06/30/2023   lidocaine (LIDODERM) 5 % Place 1 patch onto the skin daily. Remove & Discard patch within 12 hours or as directed by MD (Patient taking differently: Place 1 patch onto the skin as directed. Remove & Discard patch within 12 hours or as directed by MD Every 24 hours PRN per MAR) 60 patch 11 Taking Differently   linaclotide (LINZESS) 72 MCG capsule Take 72 mcg by mouth daily before breakfast.   Taking   metolazone (ZAROXOLYN) 2.5 MG tablet TAKE 1 TABLET BY MOUTH DAILY AS NEEDED FOR 2 DAYS FOR WEIGHT ABOVE 255 POUNDS 30 tablet 0 Taking   metoprolol succinate (TOPROL-XL) 100 MG 24 hr tablet Take 100 mg by mouth daily. Take with or immediately following a meal.   Taking   metoprolol succinate (TOPROL-XL) 25 MG 24 hr tablet Take 25 mg by mouth daily. Take with the  100 mg tablet for a total of 125 mg daily   Taking   morphine (MS CONTIN) 15 MG 12 hr tablet Take 1 tablet (15  mg total) by mouth every 12 (twelve) hours. 10 tablet 0 06/30/2023   Multiple Vitamin (MULTIVITAMIN WITH MINERALS) TABS Take 1 tablet by mouth daily.    06/30/2023 at  8:00 AM   olmesartan (BENICAR) 40 MG tablet Take 40 mg by mouth daily.   Taking   ondansetron (ZOFRAN-ODT) 4 MG disintegrating tablet Take 1 tablet (4 mg total) by mouth every 8 (eight) hours as needed for nausea or vomiting. 30 tablet 3 Taking As Needed   oxyCODONE (OXY IR/ROXICODONE) 5 MG immediate release tablet Take 1 tablet (5 mg total) by mouth every 6 (six) hours as needed for moderate pain (pain score 4-6). 10 tablet 0 Taking As Needed   pantoprazole (PROTONIX) 40 MG tablet Take 1 tablet (40 mg total) by mouth 2 (two) times daily. 120 tablet 4 06/30/2023   polyethylene glycol (MIRALAX / GLYCOLAX) 17 g packet Take 17 g by mouth 2 (two) times daily. Hold for diarrhea   06/30/2023   Potassium Chloride ER 20 MEQ TBCR Please take 20 mg p.o. twice daily for first 3 days, then go back to 20 mg oral daily, monitor frequently and adjust as needed (Patient taking differently: Take 20 mEq by mouth daily. Please take 20 mg p.o. twice daily for first 3 days, then go back to 20 mg oral daily, monitor frequently and adjust as needed)   06/29/2023   predniSONE (DELTASONE) 10 MG tablet Take 1 tablet (10 mg total) by mouth daily with breakfast. 90 tablet 1 06/30/2023 at  8:00 AM   Semaglutide,0.25 or 0.5MG /DOS, (OZEMPIC, 0.25 OR 0.5 MG/DOSE,) 2 MG/1.5ML SOPN Inject 0.5 mg into the skin once a week.   Taking   simethicone (MYLICON) 80 MG chewable tablet Chew 1 tablet (80 mg total) by mouth every 6 (six) hours as needed for flatulence.   Taking As Needed   OXYGEN Inhale 3 L into the lungs See admin instructions. 3 L at bedtime, and 3 L during the day as needed for exertion      Home medication reconciliation was completed with the patient.    Scheduled Inpatient Medications:    allopurinol  300 mg Oral Daily   atorvastatin  10 mg Oral QHS   azithromycin  250 mg Oral Q M,W,F   cholecalciferol  2,000 Units Oral Daily   donepezil  10 mg Oral QHS   DULoxetine  60 mg Oral Daily   enoxaparin (LOVENOX) injection  0.5 mg/kg Subcutaneous Q24H   gabapentin  300 mg Oral BID   guaiFENesin  600 mg Oral BID   hydroxychloroquine  200 mg Oral Daily   leflunomide  20 mg Oral Daily   levothyroxine  50 mcg Oral Q0600   lidocaine  1 patch Transdermal Q24H   metoprolol succinate  125 mg Oral Daily   morphine  15 mg Oral Q12H   pantoprazole  40 mg Oral BID   potassium chloride  40 mEq Oral Q4H   predniSONE  10 mg Oral Q breakfast    Continuous Inpatient Infusions:    PRN Inpatient Medications:  acetaminophen **OR** acetaminophen, albuterol, benzonatate, diphenoxylate-atropine, ondansetron **OR** ondansetron (ZOFRAN) IV, oxyCODONE, simethicone, traZODone  Family History: family history includes Breast cancer in her sister and sister; Diabetes in her brother, mother, and sister; Esophageal cancer in her brother; Fibroids in her daughter; Gout in her brother; Heart attack in her brother and mother; Heart disease in her brother; Heart murmur in her son; Hypertension in her brother, daughter, daughter, father, and son; Kidney cancer  in her brother; Kidney failure in her brother; Lung cancer in her father; Prostate cancer in her brother; Rheum arthritis in her maternal uncle; Stomach cancer in her brother; Uterine cancer in her daughter.  The patient's family history is negative for inflammatory bowel disorders, GI malignancy, or solid organ transplantation.  Social History:   reports that she has never smoked. She has never used smokeless tobacco. She reports that she does not drink alcohol and does not use drugs. The patient denies ETOH, tobacco, or drug use.   Review of Systems: Constitutional: Weight is stable.  Eyes: No changes in  vision. ENT: No oral lesions, sore throat.  GI: see HPI.  Heme/Lymph: No easy bruising.  CV: No chest pain.  GU: No hematuria.  Integumentary: No rashes.  Neuro: No headaches.  Psych: No depression/anxiety.  Endocrine: No heat/cold intolerance.  Allergic/Immunologic: No urticaria.  Resp: No cough, SOB.  Musculoskeletal: No joint swelling.    Physical Examination: BP 117/65 (BP Location: Left Arm)   Pulse 80   Temp 98 F (36.7 C)   Resp 18   Ht 5\' 3"  (1.6 m)   Wt 90.7 kg   SpO2 99%   BMI 35.43 kg/m  Gen: NAD, alert and oriented x 4 HEENT: PEERLA, EOMI, Neck: supple, no JVD or thyromegaly Chest: CTA bilaterally, no wheezes, crackles, or other adventitious sounds CV: RRR, no m/g/c/r Abd: soft, NT, ND, +BS in all four quadrants; no HSM, guarding, ridigity, or rebound tenderness Ext: no edema, well perfused with 2+ pulses, Skin: no rash or lesions noted Lymph: no LAD  Data: Lab Results  Component Value Date   WBC 5.2 07/03/2023   HGB 10.2 (L) 07/03/2023   HCT 32.7 (L) 07/03/2023   MCV 97.3 07/03/2023   PLT 185 07/03/2023   Recent Labs  Lab 07/01/23 0534 07/02/23 0530 07/03/23 0527  HGB 11.6* 11.0* 10.2*   Lab Results  Component Value Date   NA 140 07/03/2023   K 3.1 (L) 07/03/2023   CL 106 07/03/2023   CO2 28 07/03/2023   BUN 15 07/03/2023   CREATININE 0.73 07/03/2023   GLU 106 08/01/2020   Lab Results  Component Value Date   ALT 19 06/30/2023   AST 18 06/30/2023   ALKPHOS 54 06/30/2023   BILITOT 1.0 06/30/2023   Recent Labs  Lab 07/01/23 0640  INR 1.1   CT abd/pelvis with contrast 06/30/2023: IMPRESSION: 1. Wall thickening and mucosal enhancement of small bowel in the right lower quadrant suspicious for enteritis. Mild air distension of the colon with areas of wall thickening and mucosal enhancement consistent with colitis. Caliber change in the pelvis at the level of the distal sigmoid colon where there is tethered appearance of the sigmoid  colon to a thickened bladder and the left ovary as seen on prior exams. Findings raise concern for possible colon stricture and mild upstream obstruction. Concern previously raised for colovesical fistula, no gas in the bladder on today's study. 2. Nonobstructing right kidney stones. 3. Bronchial wall thickening and mild mucous plugging in the right lower lobe with peribronchovascular hazy density which could be inflammatory. 4. Aortic atherosclerosis.  Assessment/Plan:  83 y/o AA female with a PMH of HTN, HLD, hypothyroidism, gout, chronic pain syndrome, CAD, HFpEF, bronchiectasis on chronic O2 supplementation, ILD, hx of DVT/PE on chronic anticoagulation, asthma, anxiety, rheumatoid arthritis on chronic steroids, GERD without esophagitis, and PAD presented to the [facility] ED 2/11 for chief complaint of acute onset generalized abdominal pain  Colitis  Diverticular stricture of sigmoid colon  Diarrhea - likely multifactorial in setting of post-infectious IBS, radiographic evidence of colitis, medication side effects, functional, etc  Hx of RSV/Influenza - admission last month  Hx of ileus - hospital admission last month   Chronic respiratory failure with hypoxia on chronic O2 supplementation  Hx of DVT/PE on chronic anticoagulation  Rheumatoid arthritis on chronic steroids  Chronic constipation  Chronic pain syndrome  Recommendations:  - Continue supportive care with IV fluid hydration, pain control, and antiemetics per primary team - Replete electrolytes as necessary per primary team - Discontinue antibiotics since infectious work-up has been negative  - No plans for any endoluminal evaluation at this time - Colonic stricture at mid-sigmoid colon was found 03/2022 and she has been evaluated by General Surgery at [facility] 04/2022 and deemed not to be a great surgical candidate due to multiple medical comorbidities. She is high-risk for surgery.  - Continue to  monitor bowel habits. Lomotil is OK PRN for diarrhea. She has baseline hx of chronic constipation. Hold laxatives while having diarrhea.  - GI will sign off and follow peripherally. Please message Dr. Norma Fredrickson or myself if there are concerns or questions.  - She should follow-up outpatient with primary GI upon discharge at [facility]  Thank you for the consult. P  Gilda Crease, PA-C [facility] [phone]

## 2023-07-03 NOTE — Plan of Care (Signed)
Patient more awake and alert this shift. One bowel movement overnight. No distress noted. No complaints voiced. Patient repositioned frequently, all needs attended to. Will continue to monitor.   Problem: Fluid Volume: Goal: Hemodynamic stability will improve Outcome: Progressing   Problem: Clinical Measurements: Goal: Diagnostic test results will improve Outcome: Progressing   Problem: Respiratory: Goal: Ability to maintain adequate ventilation will improve Outcome: Progressing   Problem: Coping: Goal: Level of anxiety will decrease Outcome: Progressing   Problem: Pain Managment: Goal: General experience of comfort will improve and/or be controlled Outcome: Progressing   Problem: Elimination: Goal: Will not experience complications related to bowel motility Outcome: Progressing

## 2023-07-03 NOTE — Progress Notes (Addendum)
Progress Note   Patient: [name] [id number]:[id number] DOB: 26-Oct-1940 DOA: 06/30/2023     2 DOS: the patient was seen and examined on 07/03/2023   Brief hospital course: Taken from H&P.  [name] is a 83 y.o. female with medical history significant for anxiety, asthma, PAD, COPD, coronary artery disease, gout, hypothyroidism, and hypertension, who presented to the emergency room with acute onset of generalized abdominal pain with associated distention.  The patient has a chronic rectal tube due to sigmoid stricture and has been noticing diminished output.  She had a watery bowel movement in the ER with no melena or bright red bleeding per rectum.  She is to diarrhea over the last few weeks.   Patient had a recent prolonged hospitalization at [facility] when she was admitted with concern of septic shock requiring pressors secondary to RSV, influenza A, urine cultures were positive for Klebsiella and E. coli and she received appropriate management.  Her hospital stay was also complicated with developing ileus which she was recovered before discharge.  Under sedation she had tachycardia and mild tachypnea, rest of the vitals within normal limits, labs pertinent for BUN 25 creatinine 1.15, WBC 11. EKG with sinus tachycardia, left anterior fascicular block, LVH and T wave inversion laterally. CT abdomen and pelvis some wall thickening and mucosal enhancement concerning for right lower quadrant enteritis and mild wall thickening and enhancement of the colon consistent with colitis. Incidental finding of nonobstructing right kidney stones. Bronchial wall thickening and mild mucous plugging in the right lower lobe with peribronchiolar hazy density with could be inflammatory.  Patient was started on ceftriaxone and Flagyl  2/12: Remained mildly tachycardic, leukocytosis resolved but all cell line decreased, creatinine with some improvement to 1.09, GFR improved to 51, baseline GFR more than  60, patient meeting the criteria for AKI.  BNP 74 Chest x-ray with streaky bibasilar opacities, may represent atelectasis versus pneumonia.  C. difficile and GI pathogen panel negative Respiratory panel for COVID, influenza and RSV negative.  Patient has positive influenza A and RSV 2 weeks ago. Holding diuretic which include Lasix, spironolactone and metolazone today. Giving some IV fluid.  2/13: Vital stable, labs with hypokalemia 2.9 which is being repleted, magnesium at 1.6-being repleted, AKI resolved. Continue to have abdominal pain and diarrhea-Lomotil added. PT is recommending SNF   Assessment and Plan:  Diarrhea Having diarrhea and no vomiting. Ct showing possible ileitis/colitis, ongoing sigmoid stricture - advance diet - hold abx, not clear this is an infection - will touch base w/ GI  Hypothyroidism - Continue Synthroid. - f/u tsh  Essential hypertension - We will continue Lopressor. -Holding home diuretic as she appears dry and normal BNP  Dyslipidemia - We will continue statin therapy.   Hypokalemia - replete  Peripheral neuropathy - We will continue Neurontin.  GERD without esophagitis - hold ppi as this can cause diarrhea  Gout - hold allopurinol as this can cause diarrhea - Colchicine will be held off for diarrhea for now. - The patient has no current acute attack.  Chronic heart failure with preserved ejection fraction (HFpEF) (HCC) Last 2D echo in 2023 showed LVEF of 55% with grade 1 diastolic dysfunction, BNP within normal limit at 74 and clinically appears dry due to poor po intake. -Holding home diuretics which include Lasix, metolazone and spironolactone. -Monitor volume status closely as patient is getting IV fluid  Chronic respiratory failure with hypoxia (HCC) With history of ILD.  Seems stable on her baseline oxygen  requirement of 3 L. -Continue with home dose of prednisone 10 mg daily  Rheumatoid arthritis (HCC) - We will continue her  Plaquenil and leflunomide.   Subjective: Patient was tolerating clear liquid diet, no nausea or vomiting.  Abd pain earlier this morning none now. Large loose bm just now  Physical Exam: Vitals:   07/02/23 0823 07/02/23 1607 07/03/23 0000 07/03/23 0908  BP: 130/76 128/86 134/82 117/65  Pulse: (!) 103 94 92 80  Resp: 16 16 17 18   Temp: 98.5 F (36.9 C) 98.3 F (36.8 C) 98.2 F (36.8 C) 98 F (36.7 C)  TempSrc:   Oral   SpO2: 98% 99% 98% 99%  Weight:      Height:       General.  Obese elderly lady, in no acute distress. Pulmonary.  Lungs clear bilaterally, normal respiratory effort. CV.  Regular rate and rhythm, no JVD, rub or murmur. Abdomen.  Soft, nontender, nondistended, BS positive. CNS.  Alert and oriented .  No focal neurologic deficit. Extremities.  No edema, no cyanosis, pulses intact and symmetrical.  Data Reviewed: Prior data reviewed  Family Communication: son updated telephonically 2/14  Disposition: Status is: Inpatient Remains inpatient appropriate because: Severity of illness  Planned Discharge Destination: snf  DVT prophylaxis.  Lovenox    Author: Silvano Bilis, MD 07/03/2023 12:32 PM  For on call review [email or web address].

## 2023-07-04 DIAGNOSIS — K529 Noninfective gastroenteritis and colitis, unspecified: Secondary | ICD-10-CM | POA: Diagnosis not present

## 2023-07-04 LAB — BASIC METABOLIC PANEL
Anion gap: 6 (ref 5–15)
BUN: 11 mg/dL (ref 8–23)
CO2: 28 mmol/L (ref 22–32)
Calcium: 8 mg/dL — ABNORMAL LOW (ref 8.9–10.3)
Chloride: 105 mmol/L (ref 98–111)
Creatinine, Ser: 0.76 mg/dL (ref 0.44–1.00)
GFR, Estimated: >60 mL/min (ref >60)
Glucose, Bld: 91 mg/dL (ref 70–99)
Potassium: 3.7 mmol/L (ref 3.5–5.1)
Sodium: 139 mmol/L (ref 135–145)

## 2023-07-04 MED ORDER — DIPHENOXYLATE-ATROPINE 2.5-0.025 MG PO TABS
2.0000 | ORAL_TABLET | Freq: Every day | ORAL | Status: DC
Start: 2023-07-04 — End: 2023-07-04

## 2023-07-04 MED ORDER — METOPROLOL SUCCINATE ER 50 MG PO TB24
50.0000 mg | ORAL_TABLET | Freq: Every day | ORAL | Status: DC
  Administered 2023-07-04 – 2023-07-10 (×7): 50 mg via ORAL
  Filled 2023-07-04 (×7): qty 1

## 2023-07-04 MED ORDER — LOPERAMIDE HCL 2 MG PO CAPS
2.0000 mg | ORAL_CAPSULE | Freq: Every day | ORAL | Status: DC
  Administered 2023-07-04 – 2023-07-06 (×3): 2 mg via ORAL
  Filled 2023-07-04 (×3): qty 1

## 2023-07-04 NOTE — Plan of Care (Signed)
  Problem: Fluid Volume: Goal: Hemodynamic stability will improve Outcome: Progressing   Problem: Clinical Measurements: Goal: Diagnostic test results will improve Outcome: Progressing Goal: Signs and symptoms of infection will decrease Outcome: Progressing   Problem: Respiratory: Goal: Ability to maintain adequate ventilation will improve Outcome: Progressing   Problem: Education: Goal: Knowledge of General Education information will improve Description: Including pain rating scale, medication(s)/side effects and non-pharmacologic comfort measures Outcome: Progressing   Problem: Health Behavior/Discharge Planning: Goal: Ability to manage health-related needs will improve Outcome: Progressing   Problem: Clinical Measurements: Goal: Ability to maintain clinical measurements within normal limits will improve Outcome: Progressing Goal: Will remain free from infection Outcome: Progressing Goal: Diagnostic test results will improve Outcome: Progressing Goal: Respiratory complications will improve Outcome: Progressing Goal: Cardiovascular complication will be avoided Outcome: Progressing   Problem: Nutrition: Goal: Adequate nutrition will be maintained Outcome: Progressing   Problem: Activity: Goal: Risk for activity intolerance will decrease Outcome: Progressing   Problem: Coping: Goal: Level of anxiety will decrease Outcome: Progressing   Problem: Elimination: Goal: Will not experience complications related to bowel motility Outcome: Progressing Goal: Will not experience complications related to urinary retention Outcome: Progressing   Problem: Pain Managment: Goal: General experience of comfort will improve and/or be controlled Outcome: Progressing   Problem: Safety: Goal: Ability to remain free from injury will improve Outcome: Progressing   Problem: Skin Integrity: Goal: Risk for impaired skin integrity will decrease Outcome: Progressing

## 2023-07-04 NOTE — Progress Notes (Addendum)
Progress Note   Patient: [name] [id number] DOB: May 10, 1941 DOA: 06/30/2023     3 DOS: the patient was seen and examined on 07/04/2023   Brief hospital course: Taken from H&P.  [name] is a 83 y.o. female with medical history significant for anxiety, asthma, PAD, COPD, coronary artery disease, gout, hypothyroidism, and hypertension, who presented to the emergency room with acute onset of generalized abdominal pain with associated distention.  The patient has a chronic rectal tube due to sigmoid stricture and has been noticing diminished output.  She had a watery bowel movement in the ER with no melena or bright red bleeding per rectum.  She is to diarrhea over the last few weeks.   Patient had a recent prolonged hospitalization at [facility] when she was admitted with concern of septic shock requiring pressors secondary to RSV, influenza A, urine cultures were positive for Klebsiella and E. coli and she received appropriate management.  Her hospital stay was also complicated with developing ileus which she was recovered before discharge.  Under sedation she had tachycardia and mild tachypnea, rest of the vitals within normal limits, labs pertinent for BUN 25 creatinine 1.15, WBC 11. EKG with sinus tachycardia, left anterior fascicular block, LVH and T wave inversion laterally. CT abdomen and pelvis some wall thickening and mucosal enhancement concerning for right lower quadrant enteritis and mild wall thickening and enhancement of the colon consistent with colitis. Incidental finding of nonobstructing right kidney stones. Bronchial wall thickening and mild mucous plugging in the right lower lobe with peribronchiolar hazy density with could be inflammatory.  Patient was started on ceftriaxone and Flagyl  2/12: Remained mildly tachycardic, leukocytosis resolved but all cell line decreased, creatinine with some improvement to 1.09, GFR improved to 51, baseline GFR more than  60, patient meeting the criteria for AKI.  BNP 74 Chest x-ray with streaky bibasilar opacities, may represent atelectasis versus pneumonia.  C. difficile and GI pathogen panel negative Respiratory panel for COVID, influenza and RSV negative.  Patient has positive influenza A and RSV 2 weeks ago. Holding diuretic which include Lasix, spironolactone and metolazone today. Giving some IV fluid.  2/13: Vital stable, labs with hypokalemia 2.9 which is being repleted, magnesium at 1.6-being repleted, AKI resolved. Continue to have abdominal pain and diarrhea-Lomotil added. PT is recommending SNF   Assessment and Plan:  Diarrhea Having diarrhea and no vomiting. Ct showing possible ileitis/colitis, ongoing sigmoid stricture. GI has evaluated, says infection ruled out, can stop abx. Advises symptomatic treatment for the time being - hasn't been receiving prn lomotil so will schedule that  Hypothyroidism Tsh wnl - Continue Synthroid.  Essential hypertension Controlled, low normal today - We will continue Lopressor but will decrease dose from 125 to 50 daily. -Holding home diuretic as she appears dry and normal BNP  Dyslipidemia - We will continue statin therapy.   Hypokalemia - replete  Peripheral neuropathy - We will continue Neurontin.  GERD without esophagitis - hold ppi as this can cause diarrhea  Gout - hold allopurinol as this can cause diarrhea - Colchicine will be held off for diarrhea for now. - The patient has no current acute attack.  Chronic heart failure with preserved ejection fraction (HFpEF) (HCC) Last 2D echo in 2023 showed LVEF of 55% with grade 1 diastolic dysfunction, BNP within normal limit at 74 and clinically appears dry due to poor po intake. -Holding home diuretics which include Lasix, metolazone and spironolactone. -Monitor volume status closely as patient is  getting IV fluid  Chronic respiratory failure with hypoxia (HCC) With history of ILD.  Seems  stable on her baseline oxygen requirement of 3 L. -Continue with home dose of prednisone 10 mg daily. This can contribute to diarrhea  Rheumatoid arthritis (HCC) - We will continue her Plaquenil and leflunomide.   Subjective: intermittent stomach cramps overnight now resolved, loose bm this morning, tolerating some diet  Physical Exam: Vitals:   07/03/23 0000 07/03/23 0908 07/03/23 1610 07/03/23 2347  BP: 134/82 117/65 102/64 (!) 101/53  Pulse: 92 80 84 81  Resp: 17 18 16 16   Temp: 98.2 F (36.8 C) 98 F (36.7 C) 97.8 F (36.6 C) 98 F (36.7 C)  TempSrc: Oral     SpO2: 98% 99% 99% 99%  Weight:      Height:       General.  Obese elderly lady, in no acute distress. Pulmonary.  Lungs clear bilaterally, normal respiratory effort. CV.  Regular rate and rhythm, no JVD, rub or murmur. Abdomen.  Soft, nontender, nondistended, BS positive. CNS.  Alert and oriented .  No focal neurologic deficit. Extremities.  No edema, no cyanosis, pulses intact and symmetrical.  Data Reviewed: Prior data reviewed  Family Communication: son updated telephonically 2/14. Daughter updated @ bedside 2/15  Disposition: Status is: Inpatient Remains inpatient appropriate because: Severity of illness  Planned Discharge Destination: snf  DVT prophylaxis.  Lovenox    Author: Silvano Bilis, MD 07/04/2023 9:26 AM  For on call review [email or web address].

## 2023-07-04 NOTE — Plan of Care (Signed)
   Problem: Fluid Volume: Goal: Hemodynamic stability will improve Outcome: Progressing   Problem: Clinical Measurements: Goal: Diagnostic test results will improve Outcome: Progressing Goal: Signs and symptoms of infection will decrease Outcome: Progressing   Problem: Respiratory: Goal: Ability to maintain adequate ventilation will improve Outcome: Progressing   Problem: Education: Goal: Knowledge of General Education information will improve Description: Including pain rating scale, medication(s)/side effects and non-pharmacologic comfort measures Outcome: Progressing   Problem: Health Behavior/Discharge Planning: Goal: Ability to manage health-related needs will improve Outcome: Progressing   Problem: Clinical Measurements: Goal: Ability to maintain clinical measurements within normal limits will improve Outcome: Progressing Goal: Will remain free from infection Outcome: Progressing Goal: Diagnostic test results will improve Outcome: Progressing Goal: Respiratory complications will improve Outcome: Progressing Goal: Cardiovascular complication will be avoided Outcome: Progressing   Problem: Activity: Goal: Risk for activity intolerance will decrease Outcome: Progressing   Problem: Nutrition: Goal: Adequate nutrition will be maintained Outcome: Progressing   Problem: Coping: Goal: Level of anxiety will decrease Outcome: Progressing   Problem: Elimination: Goal: Will not experience complications related to bowel motility Outcome: Progressing Goal: Will not experience complications related to urinary retention Outcome: Progressing   Problem: Pain Managment: Goal: General experience of comfort will improve and/or be controlled Outcome: Progressing   Problem: Safety: Goal: Ability to remain free from injury will improve Outcome: Progressing   Problem: Skin Integrity: Goal: Risk for impaired skin integrity will decrease Outcome: Progressing

## 2023-07-05 ENCOUNTER — Inpatient Hospital Stay: Payer: Medicare Other

## 2023-07-05 DIAGNOSIS — K529 Noninfective gastroenteritis and colitis, unspecified: Secondary | ICD-10-CM | POA: Diagnosis not present

## 2023-07-05 LAB — BASIC METABOLIC PANEL
Anion gap: 7 (ref 5–15)
BUN: 8 mg/dL (ref 8–23)
CO2: 28 mmol/L (ref 22–32)
Calcium: 7.9 mg/dL — ABNORMAL LOW (ref 8.9–10.3)
Chloride: 105 mmol/L (ref 98–111)
Creatinine, Ser: 0.66 mg/dL (ref 0.44–1.00)
GFR, Estimated: >60 mL/min (ref >60)
Glucose, Bld: 88 mg/dL (ref 70–99)
Potassium: 3.5 mmol/L (ref 3.5–5.1)
Sodium: 140 mmol/L (ref 135–145)

## 2023-07-05 LAB — CULTURE, BLOOD (ROUTINE X 2)
Culture: NO GROWTH
Culture: NO GROWTH
Special Requests: ADEQUATE

## 2023-07-05 MED ORDER — IOHEXOL 300 MG/ML  SOLN
100.0000 mL | Freq: Once | INTRAMUSCULAR | Status: AC | PRN
  Administered 2023-07-05: 100 mL via INTRAVENOUS

## 2023-07-05 MED ORDER — POTASSIUM CHLORIDE CRYS ER 20 MEQ PO TBCR
40.0000 meq | EXTENDED_RELEASE_TABLET | Freq: Once | ORAL | Status: AC
  Administered 2023-07-05: 40 meq via ORAL
  Filled 2023-07-05: qty 2

## 2023-07-05 NOTE — Progress Notes (Signed)
Progress Note   Patient: [name] [id number] DOB: Sep 18, 1940 DOA: 06/30/2023     4 DOS: the patient was seen and examined on 07/05/2023   Brief hospital course: Taken from H&P.  Ellysa F Duce is a 83 y.o. female with medical history significant for anxiety, asthma, PAD, COPD, coronary artery disease, gout, hypothyroidism, and hypertension, who presented to the emergency room with acute onset of generalized abdominal pain with associated distention.  The patient has a chronic rectal tube due to sigmoid stricture and has been noticing diminished output.  She had a watery bowel movement in the ER with no melena or bright red bleeding per rectum.  She is to diarrhea over the last few weeks.   Patient had a recent prolonged hospitalization at [facility] when she was admitted with concern of septic shock requiring pressors secondary to RSV, influenza A, urine cultures were positive for Klebsiella and E. coli and she received appropriate management.  Her hospital stay was also complicated with developing ileus which she was recovered before discharge.  Under sedation she had tachycardia and mild tachypnea, rest of the vitals within normal limits, labs pertinent for BUN 25 creatinine 1.15, WBC 11. EKG with sinus tachycardia, left anterior fascicular block, LVH and T wave inversion laterally. CT abdomen and pelvis some wall thickening and mucosal enhancement concerning for right lower quadrant enteritis and mild wall thickening and enhancement of the colon consistent with colitis. Incidental finding of nonobstructing right kidney stones. Bronchial wall thickening and mild mucous plugging in the right lower lobe with peribronchiolar hazy density with could be inflammatory.  Patient was started on ceftriaxone and Flagyl  2/12: Remained mildly tachycardic, leukocytosis resolved but all cell line decreased, creatinine with some improvement to 1.09, GFR improved to 51, baseline GFR more than  60, patient meeting the criteria for AKI.  BNP 74 Chest x-ray with streaky bibasilar opacities, may represent atelectasis versus pneumonia.  C. difficile and GI pathogen panel negative Respiratory panel for COVID, influenza and RSV negative.  Patient has positive influenza A and RSV 2 weeks ago. Holding diuretic which include Lasix, spironolactone and metolazone today. Giving some IV fluid.  2/13: Vital stable, labs with hypokalemia 2.9 which is being repleted, magnesium at 1.6-being repleted, AKI resolved. Continue to have abdominal pain and diarrhea-Lomotil added. PT is recommending SNF   Assessment and Plan:  Diarrhea Having diarrhea and no vomiting. Ct showing possible ileitis/colitis, ongoing sigmoid stricture. GI has evaluated, says infection ruled out, can stop abx. Advises symptomatic treatment for the time being. Started loperamide yesterday, patient reports improvement, one bm yesterday, loose  Abd pain Ongoing today worse in rlq - will repeat ct  Hypothyroidism Tsh wnl - Continue Synthroid.  Essential hypertension Controlled, low normal today - We will continue Lopressor but will decrease dose from 125 to 50 daily. -Holding home diuretic as she appears dry and normal BNP  Dyslipidemia - We will continue statin therapy.   Hypokalemia - replete  Peripheral neuropathy - We will continue Neurontin.  GERD without esophagitis - hold ppi as this can cause diarrhea  Gout - hold allopurinol as this can cause diarrhea - Colchicine will be held off for diarrhea for now. - The patient has no current acute attack.  Chronic heart failure with preserved ejection fraction (HFpEF) (HCC) Last 2D echo in 2023 showed LVEF of 55% with grade 1 diastolic dysfunction, BNP within normal limit at 74 and clinically appears dry due to poor po intake. -Holding home diuretics which  include Lasix, metolazone and spironolactone. -Monitor volume status closely as patient is getting IV  fluid  Chronic respiratory failure with hypoxia (HCC) With history of ILD.  Seems stable on her baseline oxygen requirement of 3 L. -Continue with home dose of prednisone 10 mg daily. This can contribute to diarrhea  Rheumatoid arthritis (HCC) - We will continue her Plaquenil and leflunomide.   Subjective: ongoing pain rlq. Diarrhea improved  Physical Exam: Vitals:   07/04/23 1052 07/04/23 1628 07/05/23 0108 07/05/23 0728  BP: 110/69 120/71 (!) 127/52 114/65  Pulse: 92 87 87 89  Resp: 18 18 18 16   Temp: 98.1 F (36.7 C) 98 F (36.7 C) 98.3 F (36.8 C) 98.1 F (36.7 C)  TempSrc:  Oral    SpO2: 100% 98% 96% 99%  Weight:      Height:       General.  Obese elderly lady, in no acute distress. Pulmonary.  Lungs clear bilaterally, normal respiratory effort. CV.  Regular rate and rhythm, no JVD, rub or murmur. Abdomen.  Soft, mild diffuse ttp worse rlq, no rebound or guarding CNS.  Alert and oriented .  No focal neurologic deficit. Extremities.  No edema, no cyanosis, pulses intact and symmetrical.  Data Reviewed: Prior data reviewed  Family Communication: son updated telephonically 2/14. Daughter updated @ bedside 2/15. All three children telephoned today, no answer  Disposition: Status is: Inpatient Remains inpatient appropriate because: pending snf placement  Planned Discharge Destination: snf  DVT prophylaxis.  Lovenox    Author: Silvano Bilis, MD 07/05/2023 11:41 AM  For on call review [email or web address]

## 2023-07-06 DIAGNOSIS — K56609 Unspecified intestinal obstruction, unspecified as to partial versus complete obstruction: Secondary | ICD-10-CM

## 2023-07-06 DIAGNOSIS — K529 Noninfective gastroenteritis and colitis, unspecified: Secondary | ICD-10-CM | POA: Diagnosis not present

## 2023-07-06 NOTE — [place] FL2 (Signed)
[place] MEDICAID FL2 LEVEL OF CARE FORM     IDENTIFICATION  Patient Name: [name] Birthdate: [birth year] Sex: female Admission Date (Current Location): 06/30/2023  [place] and [place] Number:  Chiropodist and Address:  [facility], [address], [place], [place] [address]      Provider Number: [id number]  Attending Physician Name and Address:  Kathrynn Running, MD  Relative Name and Phone Number:  Daughter  Emergency Contact  [phone]  [address] [facility]  [place] [address]    Current Level of Care: Hospital Recommended Level of Care: Skilled Nursing Facility Prior Approval Number:    Date Approved/Denied:   PASRR Number: [id number]  Discharge Plan: SNF    Current Diagnoses: Patient Active Problem List   Diagnosis Date Noted   Enterocolitis 06/30/2023   Sepsis due to undetermined organism (HCC) 06/30/2023   Hypothyroidism 06/30/2023   GERD without esophagitis 06/30/2023   Peripheral neuropathy 06/30/2023   RSV (respiratory syncytial virus pneumonia) 06/26/2023   Flu 06/26/2023   Ileus (HCC) 06/23/2023   Septic shock (HCC) 06/13/2023   Lower GI bleed 12/16/2022   BRBPR (bright red blood per rectum) 12/15/2022   [name] 09/08/2022   Abnormal CT scan, sigmoid colon 04/14/2022   Colon stricture (HCC) 04/14/2022   Diverticulosis of colon without hemorrhage 04/14/2022   Pulmonary embolism (HCC) 11/15/2021   Tachycardia 10/22/2021   Pulmonary HTN (HCC)    Unspecified protein-calorie malnutrition (HCC) 04/05/2021   Pressure injury of skin 03/31/2021   UTI (urinary tract infection) 03/31/2021   Acute encephalopathy 03/30/2021   Thrombocytopenia (HCC) 03/30/2021   Diabetic neuropathy (HCC) 05/24/2019   Hypokalemia 04/21/2019   Anticoagulated 06/25/2018   Chronic pain 06/25/2018   CRI (chronic renal insufficiency), stage 3 (moderate) (HCC) 06/25/2018   Drug-induced constipation  06/18/2018   DVT (deep venous thrombosis) (HCC) 06/03/2018   Primary osteoarthritis of both knees 05/26/2018   Body mass index (BMI) of 50-59.9 in adult (HCC) 05/26/2018   Acquired hypothyroidism 05/26/2018   Chronic bilateral low back pain without sciatica 02/17/2018   Astigmatism with presbyopia, bilateral 03/26/2017   Cortical age-related cataract of both eyes 03/26/2017   Family history of glaucoma 03/26/2017   Long term current use of oral hypoglycemic drug 03/26/2017   Nuclear sclerotic cataract of both eyes 03/26/2017   Pain in left hip 02/18/2017   Closed nondisplaced intertrochanteric fracture of left femur with delayed healing 01/14/2017   Gout 11/29/2016   Depression 11/29/2016   Closed left hip fracture (HCC) 11/29/2016   GERD (gastroesophageal reflux disease) 11/29/2016   Closed intertrochanteric fracture of hip, left, initial encounter (HCC) 11/29/2016   Physical deconditioning 07/31/2015   Pulmonary fibrosis, postinflammatory (HCC) 07/31/2015   Bronchiectasis without complication (HCC) 07/31/2015   Tendinitis of left rotator cuff 06/13/2015   Chronic heart failure with preserved ejection fraction (HFpEF) (HCC) 04/21/2015   Acute kidney injury superimposed on chronic kidney disease (HCC) 04/17/2015   Chronic respiratory failure with hypoxia (HCC) 02/09/2015   Dyspnea and respiratory abnormality 01/03/2015   Normal coronary arteries 11/02/2014   Morbid obesity (HCC) 10/30/2014   Dysphagia 08/21/2014   Chronic fatigue 06/21/2014   DOE (dyspnea on exertion) 05/24/2014   Primary osteoarthritis of left knee 04/26/2014   High risk medication use 04/17/2014   Aphasia 02/12/2014   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) 01/18/2014   Lumbar facet arthropathy 01/18/2014   Bilateral edema of lower extremity 05/30/2013  Chronic cough 05/24/2013   ILD (interstitial lung disease) (HCC) 04/10/2013   Spinal stenosis of lumbar region with radiculopathy 07/12/2012    Inability to walk 07/12/2012   Meningioma of left sphenoid wing involving cavernous sinus (HCC) 02/17/2012   Chest pain of uncertain etiology 01/28/2012   Rheumatoid arthritis (HCC) 01/28/2012   Primary osteoarthritis of right knee 01/28/2012   Dyslipidemia    Thyroid disease    Essential hypertension     Orientation RESPIRATION BLADDER Height & Weight     Self, Place  O2 Incontinent Weight: 90.7 kg Height:  5\' 3"  (160 cm)  BEHAVIORAL SYMPTOMS/MOOD NEUROLOGICAL BOWEL NUTRITION STATUS      Incontinent Diet (see DC summary)  AMBULATORY STATUS COMMUNICATION OF NEEDS Skin   Extensive Assist Verbally Normal                       Personal Care Assistance Level of Assistance  Bathing, Feeding, Dressing Bathing Assistance: Maximum assistance Feeding assistance: Limited assistance Dressing Assistance: Maximum assistance     Functional Limitations Info  Sight, Hearing, Speech Sight Info: Adequate Hearing Info: Adequate Speech Info: Adequate    SPECIAL CARE FACTORS FREQUENCY  PT (By licensed PT), OT (By licensed OT)     PT Frequency: 5 times per week OT Frequency: 5 times per week            Contractures Contractures Info: Not present    Additional Factors Info  Code Status, Allergies Code Status Info: full code Allergies Info: Codeine, Infliximab, Lisinopril, Ofev (Nintedanib)           Current Medications (07/06/2023):  This is the current hospital active medication list Current Facility-Administered Medications  Medication Dose Route Frequency Provider Last Rate Last Admin   acetaminophen (TYLENOL) tablet 650 mg  650 mg Oral Q6H PRN Mansy, Jan A, MD   650 mg at 07/05/23 1247   Or   acetaminophen (TYLENOL) suppository 650 mg  650 mg Rectal Q6H PRN Mansy, Jan A, MD       albuterol (PROVENTIL) (2.5 MG/3ML) 0.083% nebulizer solution 3 mL  3 mL Inhalation PRN Mansy, Jan A, MD       atorvastatin (LIPITOR) tablet 10 mg  10 mg Oral QHS Prince Solian F, RPH    10 mg at 07/05/23 2316   azithromycin (ZITHROMAX) tablet 250 mg  250 mg Oral Q M,W,F Arnetha Courser, MD   250 mg at 07/06/23 1000   benzonatate (TESSALON) capsule 100 mg  100 mg Oral BID PRN Arnetha Courser, MD   100 mg at 07/06/23 0959   cholecalciferol (VITAMIN D3) 25 MCG (1000 UNIT) tablet 2,000 Units  2,000 Units Oral Daily Arnetha Courser, MD   2,000 Units at 07/06/23 0959   donepezil (ARICEPT) tablet 10 mg  10 mg Oral QHS Mansy, Jan A, MD   10 mg at 07/05/23 2316   DULoxetine (CYMBALTA) DR capsule 60 mg  60 mg Oral Daily Mansy, Jan A, MD   60 mg at 07/06/23 1000   enoxaparin (LOVENOX) injection 45 mg  0.5 mg/kg Subcutaneous Q24H Mansy, Jan A, MD   45 mg at 07/06/23 0959   gabapentin (NEURONTIN) capsule 300 mg  300 mg Oral BID Mansy, Jan A, MD   300 mg at 07/06/23 0959   guaiFENesin (MUCINEX) 12 hr tablet 600 mg  600 mg Oral BID Mansy, Jan A, MD   600 mg at 07/06/23 1000   hydroxychloroquine (PLAQUENIL) tablet 200 mg  200 mg Oral  Daily Mansy, Jan A, MD   200 mg at 07/06/23 1000   leflunomide (ARAVA) tablet 20 mg  20 mg Oral Daily Mansy, Jan A, MD   20 mg at 07/06/23 1000   levothyroxine (SYNTHROID) tablet 50 mcg  50 mcg Oral Q0600 Mansy, Jan A, MD   50 mcg at 07/06/23 0510   lidocaine (LIDODERM) 5 % 1 patch  1 patch Transdermal Q24H Arnetha Courser, MD   1 patch at 07/05/23 1753   loperamide (IMODIUM) capsule 2 mg  2 mg Oral Daily Kathrynn Running, MD   2 mg at 07/06/23 4696   metoprolol succinate (TOPROL-XL) 24 hr tablet 50 mg  50 mg Oral Daily Kathrynn Running, MD   50 mg at 07/06/23 0959   morphine (MS CONTIN) 12 hr tablet 15 mg  15 mg Oral Q12H Mansy, Jan A, MD   15 mg at 07/06/23 1000   ondansetron (ZOFRAN) tablet 4 mg  4 mg Oral Q6H PRN Mansy, Jan A, MD   4 mg at 07/05/23 1250   Or   ondansetron [facility]) injection 4 mg  4 mg Intravenous Q6H PRN Mansy, Jan A, MD   4 mg at 07/02/23 1107   oxyCODONE (Oxy IR/ROXICODONE) immediate release tablet 5 mg  5 mg Oral Q6H PRN Mansy, Jan A, MD   5 mg  at 07/05/23 1250   predniSONE (DELTASONE) tablet 10 mg  10 mg Oral Q breakfast Arnetha Courser, MD   10 mg at 07/06/23 0959   simethicone (MYLICON) chewable tablet 80 mg  80 mg Oral Q6H PRN Arnetha Courser, MD       traZODone (DESYREL) tablet 25 mg  25 mg Oral QHS PRN Mansy, Vernetta Honey, MD         Discharge Medications: Please see discharge summary for a list of discharge medications.  Relevant Imaging Results:  Relevant Lab Results:   Additional Information SSN: [id number]  [name], RN

## 2023-07-06 NOTE — Progress Notes (Signed)
Progress Note   Patient: [name] WGN:[id number] DOB: February 12, 1941 DOA: 06/30/2023     5 DOS: the patient was seen and examined on 07/06/2023   Brief hospital course: Taken from H&P.  [name] is a 83 y.o. female with medical history significant for anxiety, asthma, PAD, COPD, coronary artery disease, gout, hypothyroidism, and hypertension, who presented to the emergency room with acute onset of generalized abdominal pain with associated distention.  The patient has a chronic rectal tube due to sigmoid stricture and has been noticing diminished output.  She had a watery bowel movement in the ER with no melena or bright red bleeding per rectum.  She is to diarrhea over the last few weeks.   Patient had a recent prolonged hospitalization at [facility] when she was admitted with concern of septic shock requiring pressors secondary to RSV, influenza A, urine cultures were positive for Klebsiella and E. coli and she received appropriate management.  Her hospital stay was also complicated with developing ileus which she was recovered before discharge.  Under sedation she had tachycardia and mild tachypnea, rest of the vitals within normal limits, labs pertinent for BUN 25 creatinine 1.15, WBC 11. EKG with sinus tachycardia, left anterior fascicular block, LVH and T wave inversion laterally. CT abdomen and pelvis some wall thickening and mucosal enhancement concerning for right lower quadrant enteritis and mild wall thickening and enhancement of the colon consistent with colitis. Incidental finding of nonobstructing right kidney stones. Bronchial wall thickening and mild mucous plugging in the right lower lobe with peribronchiolar hazy density with could be inflammatory.  Patient was started on ceftriaxone and Flagyl  2/12: Remained mildly tachycardic, leukocytosis resolved but all cell line decreased, creatinine with some improvement to 1.09, GFR improved to 51, baseline GFR more than  60, patient meeting the criteria for AKI.  BNP 74 Chest x-ray with streaky bibasilar opacities, may represent atelectasis versus pneumonia.  C. difficile and GI pathogen panel negative Respiratory panel for COVID, influenza and RSV negative.  Patient has positive influenza A and RSV 2 weeks ago. Holding diuretic which include Lasix, spironolactone and metolazone today. Giving some IV fluid.  2/13: Vital stable, labs with hypokalemia 2.9 which is being repleted, magnesium at 1.6-being repleted, AKI resolved. Continue to have abdominal pain and diarrhea-Lomotil added. PT is recommending SNF   Assessment and Plan:  Diarrhea Abdominal pain Sigmoid stricture Colitis Having diarrhea and no vomiting. Ct showing possible ileitis/colitis, ongoing sigmoid stricture. GI has evaluated, says infection ruled out, can stop abx. Advises symptomatic treatment for the time being. Started loperamide yesterday, patient reports improvement, one bm today. Repeat ct shows ongoing stricture. Saw gen surg in 2023, have asked them to eval today  Hypothyroidism Tsh wnl - Continue Synthroid.  Essential hypertension Controlled, low normal today - We will continue Lopressor but have decreased dose from 125 to 50 daily. -Holding home diuretic as she appears dry and normal BNP  Dyslipidemia - We will continue statin therapy.   Hypokalemia - replete  Peripheral neuropathy - We will continue Neurontin.  GERD without esophagitis - hold ppi as this can cause diarrhea  Gout - hold allopurinol as this can cause diarrhea - Colchicine will be held off for diarrhea for now. - The patient has no current acute attack.  Chronic heart failure with preserved ejection fraction (HFpEF) (HCC) Last 2D echo in 2023 showed LVEF of 55% with grade 1 diastolic dysfunction, BNP within normal limit at 74 and clinically appears dry due  to poor po intake. -Holding home diuretics which include Lasix, metolazone and  spironolactone. -Monitor volume status closely as patient is getting IV fluid  Chronic respiratory failure with hypoxia (HCC) With history of ILD.  Seems stable on her baseline oxygen requirement of 3 L. -Continue with home dose of prednisone 10 mg daily. This can contribute to diarrhea  Rheumatoid arthritis (HCC) - We will continue her Plaquenil and leflunomide.   Subjective: ongoing lower abd pain, one bm today  Physical Exam: Vitals:   07/05/23 1612 07/05/23 1800 07/05/23 2315 07/06/23 0800  BP: 121/73  130/76 (!) 141/84  Pulse: (!) 112 89 92 94  Resp: 16  16 19   Temp: 97.7 F (36.5 C)  98.8 F (37.1 C) 98.3 F (36.8 C)  TempSrc:      SpO2: 99% 98% 100% 100%  Weight:      Height:       General.  Obese elderly lady, in no acute distress. Pulmonary.  Lungs clear bilaterally, normal respiratory effort. CV.  Regular rate and rhythm, no JVD, rub or murmur. Abdomen.  Soft, mild diffuse ttp worse lower quadrants, no rebound or guarding CNS.  Alert and oriented .  No focal neurologic deficit. Extremities.  No edema, no cyanosis, pulses intact and symmetrical.  Data Reviewed: Prior data reviewed  Family Communication: son curtis updated telephonically 2/17  Disposition: Status is: Inpatient Remains inpatient appropriate because: pending snf placement  Planned Discharge Destination: snf  DVT prophylaxis.  Lovenox    Author: Silvano Bilis, MD 07/06/2023 1:12 PM  For on call review [email or web address].

## 2023-07-06 NOTE — Care Management Important Message (Signed)
Important Message  Patient Details  Name: [name] MRN: [id number] Date of Birth: [birth year]   Important Message Given:  Yes - Medicare IM     Cristela Blue, CMA 07/06/2023, 10:06 AM

## 2023-07-06 NOTE — Plan of Care (Signed)
   Problem: Fluid Volume: Goal: Hemodynamic stability will improve Outcome: Progressing

## 2023-07-06 NOTE — TOC Initial Note (Signed)
Transition of Care [facility]) - Initial/Assessment Note    Patient Details  Name: [name] MRN: [id number] Date of Birth: March 23, 1941  Transition of [facility]) CM/SW Contact:    [name], RN Phone Number: 07/06/2023, 2:19 PM  Clinical Narrative:                 Spoke with Patient's [name] and she provided the phone number and fax number for [facility] in [place] She requested that I fax the Fl2 to this facility to fax number [phone], phone number is [phone] Faxed the Fl2 and clinical documents requesting them to accept the patient for STR, PASSR number obtained   Expected Discharge Plan: Skilled Nursing Facility Barriers to Discharge: SNF Pending bed offer   Patient Goals and CMS Choice            Expected Discharge Plan and Services   Discharge Planning Services: CM Consult   Living arrangements for the past 2 months: Single Family Home                                      Prior Living Arrangements/Services Living arrangements for the past 2 months: Single Family Home Lives with:: Spouse              Current home services: DME    Activities of Daily Living   ADL Screening (condition at time of admission) Independently performs ADLs?: No Does the patient have a NEW difficulty with bathing/dressing/toileting/self-feeding that is expected to last >3 days?: Yes (Initiates electronic notice to provider for possible OT consult) Does the patient have a NEW difficulty with getting in/out of bed, walking, or climbing stairs that is expected to last >3 days?: Yes (Initiates electronic notice to provider for possible PT consult) Does the patient have a NEW difficulty with communication that is expected to last >3 days?: No Is the patient deaf or have difficulty hearing?: No Does the patient have difficulty seeing, even when wearing glasses/contacts?: No Does the patient have difficulty concentrating, remembering, or making  decisions?: Yes  Permission Sought/Granted                  Emotional Assessment              Admission diagnosis:  Enterocolitis [K52.9] Colitis [K52.9] Ileus (HCC) [K56.7] Patient Active Problem List   Diagnosis Date Noted   Enterocolitis 06/30/2023   Sepsis due to undetermined organism (HCC) 06/30/2023   Hypothyroidism 06/30/2023   GERD without esophagitis 06/30/2023   Peripheral neuropathy 06/30/2023   RSV (respiratory syncytial virus pneumonia) 06/26/2023   Flu 06/26/2023   Ileus (HCC) 06/23/2023   Septic shock (HCC) 06/13/2023   Lower GI bleed 12/16/2022   BRBPR (bright red blood per rectum) 12/15/2022   [name] 09/08/2022   Abnormal CT scan, sigmoid colon 04/14/2022   Colon stricture (HCC) 04/14/2022   Diverticulosis of colon without hemorrhage 04/14/2022   Pulmonary embolism (HCC) 11/15/2021   Tachycardia 10/22/2021   Pulmonary HTN (HCC)    Unspecified protein-calorie malnutrition (HCC) 04/05/2021   Pressure injury of skin 03/31/2021   UTI (urinary tract infection) 03/31/2021   Acute encephalopathy 03/30/2021   Thrombocytopenia (HCC) 03/30/2021   Diabetic neuropathy (HCC) 05/24/2019   Hypokalemia 04/21/2019   Anticoagulated 06/25/2018   Chronic pain 06/25/2018   CRI (chronic renal insufficiency), stage 3 (moderate) (HCC) 06/25/2018  Drug-induced constipation 06/18/2018   DVT (deep venous thrombosis) (HCC) 06/03/2018   Primary osteoarthritis of both knees 05/26/2018   Body mass index (BMI) of 50-59.9 in adult [facility]) 05/26/2018   Acquired hypothyroidism 05/26/2018   Chronic bilateral low back pain without sciatica 02/17/2018   Astigmatism with presbyopia, bilateral 03/26/2017   Cortical age-related cataract of both eyes 03/26/2017   Family history of glaucoma 03/26/2017   Long term current use of oral hypoglycemic drug 03/26/2017   Nuclear sclerotic cataract of both eyes 03/26/2017   Pain in left hip  02/18/2017   Closed nondisplaced intertrochanteric fracture of left femur with delayed healing 01/14/2017   Gout 11/29/2016   Depression 11/29/2016   Closed left hip fracture (HCC) 11/29/2016   GERD (gastroesophageal reflux disease) 11/29/2016   Closed intertrochanteric fracture of hip, left, initial encounter (HCC) 11/29/2016   Physical deconditioning 07/31/2015   Pulmonary fibrosis, postinflammatory (HCC) 07/31/2015   Bronchiectasis without complication (HCC) 07/31/2015   Tendinitis of left rotator cuff 06/13/2015   Chronic heart failure with preserved ejection fraction (HFpEF) (HCC) 04/21/2015   Acute kidney injury superimposed on chronic kidney disease (HCC) 04/17/2015   Chronic respiratory failure with hypoxia (HCC) 02/09/2015   Dyspnea and respiratory abnormality 01/03/2015   Normal coronary arteries 11/02/2014   Morbid obesity (HCC) 10/30/2014   Dysphagia 08/21/2014   Chronic fatigue 06/21/2014   DOE (dyspnea on exertion) 05/24/2014   Primary osteoarthritis of left knee 04/26/2014   High risk medication use 04/17/2014   Aphasia 02/12/2014   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) 01/18/2014   Lumbar facet arthropathy 01/18/2014   Bilateral edema of lower extremity 05/30/2013   Chronic cough 05/24/2013   ILD (interstitial lung disease) (HCC) 04/10/2013   Spinal stenosis of lumbar region with radiculopathy 07/12/2012   Inability to walk 07/12/2012   Meningioma of left sphenoid wing involving cavernous sinus (HCC) 02/17/2012   Chest pain of uncertain etiology 01/28/2012   Rheumatoid arthritis (HCC) 01/28/2012   Primary osteoarthritis of right knee 01/28/2012   Dyslipidemia    Thyroid disease    Essential hypertension    PCP:  [name], MD Pharmacy:   [facility] - [place], [place] - [address] [facility] [address] [place] [address] [place] [address] Phone: [phone] Fax: [phone]  [id number] - [place], [place] - [address] AT [facility] [address] [facility] [address] [place] [address] Phone: [phone] Fax: [phone]     Social Drivers of Health (SDOH) Social History: SDOH Screenings   Food Insecurity: No Food Insecurity (07/01/2023)  Housing: Low Risk  (07/01/2023)  Transportation Needs: No Transportation Needs (07/01/2023)  Utilities: Not At Risk (07/01/2023)  Alcohol Screen: Low Risk  (10/27/2022)  Depression (PHQ2-9): High Risk (06/12/2023)  Financial Resource Strain: Low Risk  (06/11/2023)  Physical Activity: Inactive (06/11/2023)  Social Connections: Socially Integrated (07/01/2023)  Stress: No Stress Concern Present (10/27/2022)  Tobacco Use: Low Risk  (07/01/2023)   SDOH Interventions:     Readmission Risk Interventions    12/16/2022   11:09 AM  Readmission Risk Prevention Plan  Transportation Screening Complete  PCP or Specialist Appt within 5-7 Days Complete  Home Care Screening Complete  Medication Review (RN CM) Complete

## 2023-07-06 NOTE — Consult Note (Signed)
Patient ID: [name], female   DOB: 10-Sep-1940, 83 y.o.   MRN: [id number] CC: Sigmoid Stricture History of Present Illness [name] is a 83 y.o. female with past medical history significant for interstitial lung disease on 3 L oxygen, history of DVT and PE on anticoagulation, hypertension hyperlipidemia, congestive heart failure and rheumatoid arthritis on chronic steroids who is seen in consultation for sigmoid stricture.  The patient reports that in 2023 she started to develop intermittent abdominal pain.  She describes this pain as cramping in nature and in her lower abdomen.  As part of the workup for this she underwent a colonoscopy.  The colonoscopy was able to traverse to the appendiceal orifice but it was difficult because there was a sigmoid stricture.  They were able to get through the stricture but they had to use a very small scope.  At that time she was referred to a surgeon for the stricture.  It was recommended that she not undergo surgical intervention given her comorbidities.  She says since then she has had intermittent abdominal pain and diarrhea.  She takes MiraLAX every day and sometimes takes Linzess and senna.  Several weeks ago she was admitted to [facility] for septic shock secondary to hypovolemia.  At that time she had a rectal tube that was inserted and has had diarrhea via this.  The rectal tube has subsequently been removed and she continues to struggle with diarrhea.  She says that her cramping abdominal pain is intermittent in nature.  Past Medical History Past Medical History:  Diagnosis Date   Acute on chronic diastolic (congestive) heart failure (HCC)    Anemia    iron deficiency anemia - secondary to blood loss ( chronic)    Anxiety    Arthritis    endstage changes bilateral knees/bilateral ankles.    Asthma    Carotid artery occlusion    Chronic fatigue    Chronic kidney disease    Closed left hip fracture (HCC)    Clotting disorder (HCC)    pt  denies this   Contusion of left knee    due to fall 1/14.   COPD (chronic obstructive pulmonary disease) (HCC)    pulmonary fibrosis   Coronary artery disease    Depression, reactive    Diabetes mellitus    type II    Diastolic dysfunction    Difficulty in walking    Family history of heart disease    Generalized muscle weakness    Gout    High cholesterol    History of falling    Hypertension    Hypothyroidism    Interstitial lung disease (HCC)    Meningioma of left sphenoid wing involving cavernous sinus (HCC) 02/17/2012   Continue diplopia, left eye pain and left headaches.     Morbid obesity (HCC)    Neuromuscular disorder (HCC)    diabetic neuropathy    Normal coronary arteries    cardiac catheterization performed  10/31/14   Pneumonia    RA (rheumatoid arthritis) (HCC)    has been off methotreaxte since 10/13.   Spinal stenosis of lumbar region    Thyroid disease    Unspecified lack of coordination    URI (upper respiratory infection)        Past Surgical History:  Procedure Laterality Date   BRAIN SURGERY     Gamma knife 10/13. Needs repeat spring  '14   CARDIAC CATHETERIZATION N/A 10/31/2014   Procedure: Right/Left Heart Cath and  Coronary Angiography;  Surgeon: Corky Crafts, MD;  Location: [facility] INVASIVE CV LAB;  Service: Cardiovascular;  Laterality: N/A;   COLONOSCOPY WITH PROPOFOL N/A 04/14/2022   Procedure: COLONOSCOPY WITH PROPOFOL;  Surgeon: Beverley Fiedler, MD;  Location: [facility] ENDOSCOPY;  Service: Gastroenterology;  Laterality: N/A;   CYST EXCISION     2 on face   CYST REMOVAL NECK     ESOPHAGOGASTRODUODENOSCOPY (EGD) WITH PROPOFOL N/A 09/14/2014   Procedure: ESOPHAGOGASTRODUODENOSCOPY (EGD) WITH PROPOFOL;  Surgeon: Louis Meckel, MD;  Location: [facility] ENDOSCOPY;  Service: Endoscopy;  Laterality: N/A;   INCISION AND DRAINAGE HIP Left 01/16/2017   Procedure: IRRIGATION AND DEBRIDEMENT LEFT HIP;  Surgeon: Kathryne Hitch, MD;  Location: [facility] ORS;   Service: Orthopedics;  Laterality: Left;   INTRAMEDULLARY (IM) NAIL INTERTROCHANTERIC Left 11/29/2016   Procedure: INTRAMEDULLARY (IM) NAIL INTERTROCHANTRIC;  Surgeon: Kathryne Hitch, MD;  Location: [facility] OR;  Service: Orthopedics;  Laterality: Left;   OVARY SURGERY     RIGHT HEART CATH N/A 08/12/2021   Procedure: RIGHT HEART CATH;  Surgeon: Runell Gess, MD;  Location: [facility] INVASIVE CV LAB;  Service: Cardiovascular;  Laterality: N/A;   SHOULDER SURGERY Left    TONSILLECTOMY  age 64   VAGINAL HYSTERECTOMY     VIDEO BRONCHOSCOPY Bilateral 05/31/2013   Procedure: VIDEO BRONCHOSCOPY WITHOUT FLUORO;  Surgeon: Kalman Shan, MD;  Location: [facility] ENDOSCOPY;  Service: Cardiopulmonary;  Laterality: Bilateral;   video bronscoscopy  05/19/1998   lung    Allergies  Allergen Reactions   Codeine Swelling and Other (See Comments)    Facial swelling, Chest pain, and swelling in legs    Infliximab Anaphylaxis    (REMICADE) "sent me into shock"    Lisinopril Swelling and Rash    Face and neck swelling    Ofev [Nintedanib] Diarrhea    Current Facility-Administered Medications  Medication Dose Route Frequency Provider Last Rate Last Admin   acetaminophen (TYLENOL) tablet 650 mg  650 mg Oral Q6H PRN Mansy, Jan A, MD   650 mg at 07/05/23 1247   Or   acetaminophen (TYLENOL) suppository 650 mg  650 mg Rectal Q6H PRN Mansy, Jan A, MD       albuterol (PROVENTIL) (2.5 MG/3ML) 0.083% nebulizer solution 3 mL  3 mL Inhalation PRN Mansy, Jan A, MD       atorvastatin (LIPITOR) tablet 10 mg  10 mg Oral QHS Prince Solian F, RPH   10 mg at 07/05/23 2316   azithromycin (ZITHROMAX) tablet 250 mg  250 mg Oral Q M,W,F Arnetha Courser, MD   250 mg at 07/06/23 1000   benzonatate (TESSALON) capsule 100 mg  100 mg Oral BID PRN Arnetha Courser, MD   100 mg at 07/06/23 0959   cholecalciferol (VITAMIN D3) 25 MCG (1000 UNIT) tablet 2,000 Units  2,000 Units Oral Daily Arnetha Courser, MD   2,000 Units at 07/06/23 0959    donepezil (ARICEPT) tablet 10 mg  10 mg Oral QHS Mansy, Jan A, MD   10 mg at 07/05/23 2316   DULoxetine (CYMBALTA) DR capsule 60 mg  60 mg Oral Daily Mansy, Jan A, MD   60 mg at 07/06/23 1000   enoxaparin (LOVENOX) injection 45 mg  0.5 mg/kg Subcutaneous Q24H Mansy, Jan A, MD   45 mg at 07/06/23 0959   gabapentin (NEURONTIN) capsule 300 mg  300 mg Oral BID Mansy, Jan A, MD   300 mg at 07/06/23 0959   guaiFENesin (MUCINEX) 12 hr tablet 600 mg  600 mg Oral BID Mansy, Jan A, MD   600 mg at 07/06/23 1000   hydroxychloroquine (PLAQUENIL) tablet 200 mg  200 mg Oral Daily Mansy, Jan A, MD   200 mg at 07/06/23 1000   leflunomide (ARAVA) tablet 20 mg  20 mg Oral Daily Mansy, Jan A, MD   20 mg at 07/06/23 1000   levothyroxine (SYNTHROID) tablet 50 mcg  50 mcg Oral Q0600 Mansy, Jan A, MD   50 mcg at 07/06/23 0510   lidocaine (LIDODERM) 5 % 1 patch  1 patch Transdermal Q24H Arnetha Courser, MD   1 patch at 07/05/23 1753   loperamide (IMODIUM) capsule 2 mg  2 mg Oral Daily Kathrynn Running, MD   2 mg at 07/06/23 1610   metoprolol succinate (TOPROL-XL) 24 hr tablet 50 mg  50 mg Oral Daily Kathrynn Running, MD   50 mg at 07/06/23 9604   morphine (MS CONTIN) 12 hr tablet 15 mg  15 mg Oral Q12H Mansy, Jan A, MD   15 mg at 07/06/23 1000   ondansetron (ZOFRAN) tablet 4 mg  4 mg Oral Q6H PRN Mansy, Jan A, MD   4 mg at 07/05/23 1250   Or   ondansetron [facility]) injection 4 mg  4 mg Intravenous Q6H PRN Mansy, Jan A, MD   4 mg at 07/02/23 1107   oxyCODONE (Oxy IR/ROXICODONE) immediate release tablet 5 mg  5 mg Oral Q6H PRN Mansy, Jan A, MD   5 mg at 07/05/23 1250   predniSONE (DELTASONE) tablet 10 mg  10 mg Oral Q breakfast Arnetha Courser, MD   10 mg at 07/06/23 0959   simethicone (MYLICON) chewable tablet 80 mg  80 mg Oral Q6H PRN Arnetha Courser, MD       traZODone (DESYREL) tablet 25 mg  25 mg Oral QHS PRN Mansy, Vernetta Honey, MD        Family History Family History  Problem Relation Age of Onset   Diabetes Mother     Heart attack Mother    Hypertension Father    Lung cancer Father    Diabetes Sister    Breast cancer Sister    Breast cancer Sister    Diabetes Brother    Hypertension Brother    Heart disease Brother    Heart attack Brother    Kidney cancer Brother    Gout Brother    Kidney failure Brother        x 5   Esophageal cancer Brother    Stomach cancer Brother    Prostate cancer Brother    Uterine cancer Daughter        with mets   Fibroids Daughter    Hypertension Daughter    Hypertension Daughter    Hypertension Son    Heart murmur Son    Rheum arthritis Maternal Uncle    Alzheimer's disease Neg Hx    Dementia Neg Hx        Social History Social History   Tobacco Use   Smoking status: Never   Smokeless tobacco: Never  Vaping Use   Vaping status: Never Used  Substance Use Topics   Alcohol use: No   Drug use: No        ROS Full ROS of systems performed and is otherwise negative there than what is stated in the HPI  Physical Exam Blood pressure (!) 141/84, pulse 94, temperature 98.3 F (36.8 C), resp. rate 19, height 5\' 3"  (1.6 m), weight 90.7 kg,  SpO2 100%.  Alert and oriented x 3, able to move all extremities, PERRLA, on 3 L nasal cannula, regular rate and rhythm, abdomen is obese, nondistended and very minimal tenderness to deep palpation in the right lower quadrant.  She was seen with a tray of liquid diet and tolerated this well. Data Reviewed I have reviewed her colonoscopy report from November 2023 as well as the images that show a sigmoid stricture but it was able to be traversed.  I have also reviewed her CT scan.  She does have dilated and thickened colon that does seem to taper down into a normal caliber almost decompressed sigmoid and rectum.  There is no free air or pneumatosis.  Her labs are reassuring with normal renal function.  She also has a normal white count.  I have personally reviewed the patient's imaging and medical records.     Assessment/Plan    [name] is an 83 year old female with a myriad of medical problems including rheumatoid arthritis on chronic steroids, interstitial lung disease on home oxygen of 3 L nasal cannula, DVTs and PEs on anticoagulation who is seen in consultation for sigmoid stricture.  I had a long discussion with the patient about the likely etiology of her sigmoid stricture being diverticulosis and diverticulitis.  I also discussed with her that given that she continues to have bowel movements she is not clinically obstructed but there may be some degree of overflow diarrhea from the stricture.  On exam her pain is very minimal and she has no obvious large amount of distention although she is quite obese.  In order to help make an informed decision I discussed with her the this quick calculator.  Doing only a Hartman's procedure would put her at about 40% risk of death.  I discussed with her that given that she has no complete obstruction the best thing to do is to try to hold off surgery as much as possible.  I recommend continued MiraLAX to give her soft and even liquid stool.  I do not think that she requires any antibiotics as I think that the thickening of the colon is likely chronic in nature.  She is already tolerating a diet and she can advance on this as tolerated.  I did discuss that if she did become completely obstructed she would require a Hartman's procedure and need an ostomy for the rest of her life.  However again given the degree of risk I hope to avoid this.  All questions were asked and I left her with a copy of her Nischal calculator which is also provided below.  We will continue to see her while in house.    A total of 80 minutes was spent reviewing patient's chart, discussing patient's history performing physical exam and discussing treatment options with the patient.   Kandis Cocking 07/06/2023, 2:23 PM

## 2023-07-07 DIAGNOSIS — K56609 Unspecified intestinal obstruction, unspecified as to partial versus complete obstruction: Secondary | ICD-10-CM | POA: Diagnosis not present

## 2023-07-07 DIAGNOSIS — K529 Noninfective gastroenteritis and colitis, unspecified: Secondary | ICD-10-CM | POA: Diagnosis not present

## 2023-07-07 LAB — BASIC METABOLIC PANEL
Anion gap: 8 (ref 5–15)
BUN: 9 mg/dL (ref 8–23)
CO2: 26 mmol/L (ref 22–32)
Calcium: 8 mg/dL — ABNORMAL LOW (ref 8.9–10.3)
Chloride: 106 mmol/L (ref 98–111)
Creatinine, Ser: 0.66 mg/dL (ref 0.44–1.00)
GFR, Estimated: >60 mL/min (ref >60)
Glucose, Bld: 83 mg/dL (ref 70–99)
Potassium: 3.7 mmol/L (ref 3.5–5.1)
Sodium: 140 mmol/L (ref 135–145)

## 2023-07-07 MED ORDER — ALLOPURINOL 300 MG PO TABS
300.0000 mg | ORAL_TABLET | Freq: Every day | ORAL | Status: DC
  Administered 2023-07-08 – 2023-07-09 (×2): 300 mg via ORAL
  Filled 2023-07-07 (×3): qty 1

## 2023-07-07 MED ORDER — POLYETHYLENE GLYCOL 3350 17 G PO PACK
17.0000 g | PACK | Freq: Every day | ORAL | Status: DC
  Administered 2023-07-07 – 2023-07-09 (×3): 17 g via ORAL
  Filled 2023-07-07 (×4): qty 1

## 2023-07-07 NOTE — TOC Initial Note (Addendum)
Transition of Care [facility]) - Initial/Assessment Note    Patient Details  Name: [name] MRN: [id number] Date of Birth: August 29, 1940  [facility] CM/SW Contact:    [name], RN Phone Number: 07/07/2023, 9:33 AM  Clinical Narrative:                 Left a voice mail asking for a call back to admissions [name] [place] Waiting on a call back to confirm the request for the bed offer  Called daughter [name] and left a VM asking for a call back  Expected Discharge Plan: Skilled Nursing Facility Barriers to Discharge: SNF Pending bed offer   Patient Goals and CMS Choice            Expected Discharge Plan and Services   Discharge Planning Services: CM Consult   Living arrangements for the past 2 months: Single Family Home                                      Prior Living Arrangements/Services Living arrangements for the past 2 months: Single Family Home Lives with:: Spouse              Current home services: DME    Activities of Daily Living   ADL Screening (condition at time of admission) Independently performs ADLs?: No Does the patient have a NEW difficulty with bathing/dressing/toileting/self-feeding that is expected to last >3 days?: Yes (Initiates electronic notice to provider for possible OT consult) Does the patient have a NEW difficulty with getting in/out of bed, walking, or climbing stairs that is expected to last >3 days?: Yes (Initiates electronic notice to provider for possible PT consult) Does the patient have a NEW difficulty with communication that is expected to last >3 days?: No Is the patient deaf or have difficulty hearing?: No Does the patient have difficulty seeing, even when wearing glasses/contacts?: No Does the patient have difficulty concentrating, remembering, or making decisions?: Yes  Permission Sought/Granted                  Emotional Assessment               Admission diagnosis:  Enterocolitis [K52.9] Colitis [K52.9] Ileus (HCC) [K56.7] Patient Active Problem List   Diagnosis Date Noted   Enterocolitis 06/30/2023   Sepsis due to undetermined organism (HCC) 06/30/2023   Hypothyroidism 06/30/2023   GERD without esophagitis 06/30/2023   Peripheral neuropathy 06/30/2023   RSV (respiratory syncytial virus pneumonia) 06/26/2023   Flu 06/26/2023   Ileus (HCC) 06/23/2023   Septic shock (HCC) 06/13/2023   Lower GI bleed 12/16/2022   BRBPR (bright red blood per rectum) 12/15/2022   [name] 09/08/2022   Abnormal CT scan, sigmoid colon 04/14/2022   Colon stricture (HCC) 04/14/2022   Diverticulosis of colon without hemorrhage 04/14/2022   Pulmonary embolism (HCC) 11/15/2021   Tachycardia 10/22/2021   Pulmonary HTN (HCC)    Unspecified protein-calorie malnutrition (HCC) 04/05/2021   Pressure injury of skin 03/31/2021   UTI (urinary tract infection) 03/31/2021   Acute encephalopathy 03/30/2021   Thrombocytopenia (HCC) 03/30/2021   Diabetic neuropathy (HCC) 05/24/2019   Hypokalemia 04/21/2019   Anticoagulated 06/25/2018   Chronic pain 06/25/2018   CRI (chronic renal insufficiency), stage 3 (moderate) (HCC) 06/25/2018   Drug-induced constipation 06/18/2018   DVT (deep venous thrombosis) (HCC) 06/03/2018  Primary osteoarthritis of both knees 05/26/2018   Body mass index (BMI) of 50-59.9 in adult [facility]) 05/26/2018   Acquired hypothyroidism 05/26/2018   Chronic bilateral low back pain without sciatica 02/17/2018   Astigmatism with presbyopia, bilateral 03/26/2017   Cortical age-related cataract of both eyes 03/26/2017   Family history of glaucoma 03/26/2017   Long term current use of oral hypoglycemic drug 03/26/2017   Nuclear sclerotic cataract of both eyes 03/26/2017   Pain in left hip 02/18/2017   Closed nondisplaced intertrochanteric fracture of left femur with delayed healing 01/14/2017   Gout  11/29/2016   Depression 11/29/2016   Closed left hip fracture (HCC) 11/29/2016   GERD (gastroesophageal reflux disease) 11/29/2016   Closed intertrochanteric fracture of hip, left, initial encounter (HCC) 11/29/2016   Physical deconditioning 07/31/2015   Pulmonary fibrosis, postinflammatory (HCC) 07/31/2015   Bronchiectasis without complication (HCC) 07/31/2015   Tendinitis of left rotator cuff 06/13/2015   Chronic heart failure with preserved ejection fraction (HFpEF) (HCC) 04/21/2015   Acute kidney injury superimposed on chronic kidney disease (HCC) 04/17/2015   Chronic respiratory failure with hypoxia (HCC) 02/09/2015   Dyspnea and respiratory abnormality 01/03/2015   Normal coronary arteries 11/02/2014   Morbid obesity (HCC) 10/30/2014   Dysphagia 08/21/2014   Chronic fatigue 06/21/2014   DOE (dyspnea on exertion) 05/24/2014   Primary osteoarthritis of left knee 04/26/2014   High risk medication use 04/17/2014   Aphasia 02/12/2014   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) 01/18/2014   Lumbar facet arthropathy 01/18/2014   Bilateral edema of lower extremity 05/30/2013   Chronic cough 05/24/2013   ILD (interstitial lung disease) (HCC) 04/10/2013   Spinal stenosis of lumbar region with radiculopathy 07/12/2012   Inability to walk 07/12/2012   Meningioma of left sphenoid wing involving cavernous sinus (HCC) 02/17/2012   Chest pain of uncertain etiology 01/28/2012   Rheumatoid arthritis (HCC) 01/28/2012   Primary osteoarthritis of right knee 01/28/2012   Dyslipidemia    Thyroid disease    Essential hypertension    PCP:  [name], MD Pharmacy:   [facility] - [place], [place] - [address] [facility] [address] [place] [address] [place] [address] Phone: [phone] Fax: [phone]  [id number] - [place], [place] - [address] AT [facility] [address] [facility] [place] [address] Phone:  [phone] Fax: [phone]     Social Drivers of Health (SDOH) Social History: SDOH Screenings   Food Insecurity: No Food Insecurity (07/01/2023)  Housing: Low Risk  (07/01/2023)  Transportation Needs: No Transportation Needs (07/01/2023)  Utilities: Not At Risk (07/01/2023)  Alcohol Screen: Low Risk  (10/27/2022)  Depression (PHQ2-9): High Risk (06/12/2023)  Financial Resource Strain: Low Risk  (06/11/2023)  Physical Activity: Inactive (06/11/2023)  Social Connections: Socially Integrated (07/01/2023)  Stress: No Stress Concern Present (10/27/2022)  Tobacco Use: Low Risk  (07/01/2023)   SDOH Interventions:     Readmission Risk Interventions    12/16/2022   11:09 AM  Readmission Risk Prevention Plan  Transportation Screening Complete  PCP or Specialist Appt within 5-7 Days Complete  Home Care Screening Complete  Medication Review (RN CM) Complete

## 2023-07-07 NOTE — Progress Notes (Signed)
Physical Therapy Treatment Patient Details Name: [name] MRN: [id number] DOB: 01/29/1941 Today's Date: 07/07/2023   History of Present Illness Pt is an 83 year old female admitted with entercolitis, sepsis  PMH significant for anxiety, asthma, PAD, COPD, coronary artery disease, gout, hypothyroidism, and hypertension; of note pt has  recent prolonged hospitalization at [facility] when she was admitted with concern of septic shock requiring pressors secondary to RSV, influenza A, urine cultures were positive for Klebsiella and E. Coli. Her hospital stay was also complicated with developing ileus which she was recovered before discharge.  From [facility].    PT Comments  Patient alert, agreeable to PT, reported 7-8/10 LBP, with OT up in chair at start of session. She performed several seated therex with verbal cues (marching, LAQ). Sit <> stand with modA and RW, able to ambulate ~89ft with max encouragement due to pain. Exhibited significantly flexed trunk, minA for RW management and close chair follow. Pt declined further mobility due to pain despite encouragement. The patient would benefit from further skilled PT intervention to continue to progress towards goals.     If plan is discharge home, recommend the following: A lot of help with walking and/or transfers;A lot of help with bathing/dressing/bathroom;Assistance with cooking/housework;Direct supervision/assist for medications management;Assist for transportation;Direct supervision/assist for financial management;Help with stairs or ramp for entrance;Supervision due to cognitive status   Can travel by private vehicle     No  Equipment Recommendations  None recommended by PT    Recommendations for Other Services       Precautions / Restrictions Precautions Precautions: Fall Recall of Precautions/Restrictions: Intact Restrictions Weight Bearing Restrictions Per Provider Order: No     Mobility  Bed Mobility                General bed mobility comments: seated in recliner with OT upon PT entrance    Transfers Overall transfer level: Needs assistance Equipment used: Rolling walker (2 wheels) Transfers: Sit to/from Stand Sit to Stand: Mod assist           General transfer comment: modA from recliner, twice    Ambulation/Gait Ambulation/Gait assistance: Min assist Gait Distance (Feet): 5 Feet Assistive device: Rolling walker (2 wheels)         General Gait Details: minA to assist with RW guidance and management, pt with very flexed trunk due to back pain, and unable to ambulate further due to this pain   Stairs             Wheelchair Mobility     Tilt Bed    Modified Rankin (Stroke Patients Only)       Balance Overall balance assessment: Needs assistance Sitting-balance support: Feet supported Sitting balance-Leahy Scale: Good     Standing balance support: Bilateral upper extremity supported, During functional activity, Reliant on assistive device for balance Standing balance-Leahy Scale: Fair Standing balance comment: forward flexion due to reported back pain- pt reports this is chronic but poor overall upright positioning                            Communication Communication Communication: No apparent difficulties  Cognition Arousal: Alert Behavior During Therapy: WFL for tasks assessed/performed   PT - Cognitive impairments: No apparent impairments                                Cueing  Exercises      General Comments        Pertinent Vitals/Pain Pain Assessment Pain Assessment: 0-10 Pain Score: 8  Pain Location: back pain when attempting to amb Pain Descriptors / Indicators: Discomfort, Grimacing Pain Intervention(s): Limited activity within patient's tolerance, Monitored during session, Repositioned    Home Living                          Prior Function            PT Goals (current goals can  now be found in the care plan section) Progress towards PT goals: Progressing toward goals    Frequency    Min 1X/week      PT Plan      Co-evaluation PT/OT/SLP Co-Evaluation/Treatment: Yes Reason for Co-Treatment: For patient/therapist safety;To address functional/ADL transfers;Other (comment) PT goals addressed during session: Mobility/safety with mobility;Balance OT goals addressed during session: ADL's and self-care      AM-PAC PT "6 Clicks" Mobility   Outcome Measure  Help needed turning from your back to your side while in a flat bed without using bedrails?: A Little Help needed moving from lying on your back to sitting on the side of a flat bed without using bedrails?: A Little Help needed moving to and from a bed to a chair (including a wheelchair)?: A Little Help needed standing up from a chair using your arms (e.g., wheelchair or bedside chair)?: A Lot Help needed to walk in hospital room?: A Lot Help needed climbing 3-5 steps with a railing? : Total 6 Click Score: 14    End of Session Equipment Utilized During Treatment: Oxygen Activity Tolerance: Patient limited by pain Patient left: in chair;with call bell/phone within reach;with chair alarm set Nurse Communication: Mobility status PT Visit Diagnosis: Difficulty in walking, not elsewhere classified (R26.2);Muscle weakness (generalized) (M62.81)     Time: 1610-9604 PT Time Calculation (min) (ACUTE ONLY): 17 min  Charges:    $Therapeutic Activity: 8-22 mins PT General Charges $$ ACUTE PT VISIT: 1 Visit                     Olga Coaster PT, DPT 1:13 PM,07/07/23

## 2023-07-07 NOTE — Progress Notes (Signed)
[name] SURGICAL PROGRESS NOTE (cpt [phone])  Hospital Day(s): 6.   Interval History: Patient seen and examined, no acute events or new complaints overnight. Patient reports she is feeling better this morning. No abdominal pain. No fever, chills, nausea, emesis. No new labs this morning. Regular diet; tolerating. She is having bowel function.   Review of Systems:  Constitutional: denies fever, chills  HEENT: denies cough or congestion  Respiratory: denies any shortness of breath  Cardiovascular: denies chest pain or palpitations  Gastrointestinal: denies abdominal pain, N/V Genitourinary: denies burning with urination or urinary frequency   Vital signs in last 24 hours: [min-max] current  Temp:  [97.9 F (36.6 C)-98.3 F (36.8 C)] 97.9 F (36.6 C) (02/18 0000) Pulse Rate:  [88-94] 88 (02/18 0000) Resp:  [18-20] 18 (02/18 0000) BP: (124-141)/(76-84) 132/76 (02/18 0000) SpO2:  [100 %] 100 % (02/18 0000)     Height: 5\' 3"  (160 cm) Weight: 90.7 kg BMI (Calculated): 35.44   Intake/Output last 2 shifts:  02/17 0701 - 02/18 0700 In: 150 [P.O.:150] Out: -    Physical Exam:  Constitutional: alert, cooperative and no distress  HENT: normocephalic without obvious abnormality  Eyes: PERRL, EOM's grossly intact and symmetric  Respiratory: breathing non-labored at rest, on [place] Cardiovascular: regular rate and sinus rhythm  Gastrointestinal: obese, soft, non-tender, and non-distended, no rebound/guarding. She is certainly not peritonitic Musculoskeletal: no edema or wounds, motor and sensation grossly intact, NT    Labs:     Latest Ref Rng & Units 07/03/2023    5:27 AM 07/02/2023    5:30 AM 07/01/2023    5:34 AM  CBC  WBC 4.0 - 10.5 K/uL 5.2  7.1  9.1   Hemoglobin 12.0 - 15.0 g/dL 60.4  54.0  98.1   Hematocrit 36.0 - 46.0 % 32.7  36.0  38.6   Platelets 150 - 400 K/uL 185  214  238       Latest Ref Rng & Units 07/05/2023    4:34 AM 07/04/2023    4:49 AM 07/03/2023     5:27 AM  CMP  Glucose 70 - 99 mg/dL 88  91  99   BUN 8 - 23 mg/dL 8  11  15    Creatinine 0.44 - 1.00 mg/dL 1.91  4.78  2.95   Sodium 135 - 145 mmol/L 140  139  140   Potassium 3.5 - 5.1 mmol/L 3.5  3.7  3.1   Chloride 98 - 111 mmol/L 105  105  106   CO2 22 - 32 mmol/L 28  28  28    Calcium 8.9 - 10.3 mg/dL 7.9  8.0  7.9      Imaging studies: No new pertinent imaging studies   Assessment/Plan:  83 y.o. female with sigmoid stricture without evidence of complete obstruction at this time, complicated by pertinent comorbidities including rheumatoid arthritis on chronic steroids, interstitial lung disease on home oxygen of 3 L nasal cannula, DVTs and PEs on anticoagulation .   - Okay for diet as tolerated   - Again, no emergent surgical intervention at this time. She is at significant risk for perioperative morbidity and mortality. She understands that if she were to become obstructed we can consider Hartman's Procedure, although again this will be high risk and likely permenant ostomy.    - Monitor abdominal examination; on-going bowel function   - Pain control prn; antiemetics prn - Mobilize as tolerate; therapies following  - Further management per primary service   -  General surgery will sign off for now. Please call with questions/concerns or if she develops signs/symptoms of colonic obstruction  All of the above findings and recommendations were discussed with the patient, and the medical team, and all of patient's questions were answered to her expressed satisfaction.  -- Lynden Oxford, PA-C [facility] 07/07/2023, 7:06 AM M-F: 7am - 4pm

## 2023-07-07 NOTE — Progress Notes (Signed)
Occupational Therapy Treatment Patient Details Name: [name] MRN: [id number] DOB: 1940-05-29 Today's Date: 07/07/2023   History of present illness Pt is an 83 year old female admitted with entercolitis, sepsis  PMH significant for anxiety, asthma, PAD, COPD, coronary artery disease, gout, hypothyroidism, and hypertension; of note pt has  recent prolonged hospitalization at [facility] when she was admitted with concern of septic shock requiring pressors secondary to RSV, influenza A, urine cultures were positive for Klebsiella and E. Coli. Her hospital stay was also complicated with developing ileus which she was recovered before discharge.  From [facility]   OT comments  Chart reviewed to date, pt greeted in bed, agreeable to OT tx session targeting improving functional activity tolerance in preparation for ADL tasks. Co tx completed with PT to progress/optimize mobility portion of tx session. Increased time required for one step directions. Pt is making progress towards goals, MIN A required for bed mobility, improvements in STS with pt performing with MIN- MOD A multiple attempts for peri care as pt incontinent BM prior to OT session starting, short amb transfer to bedside chair with MIN A with RW, pt amb with RW with MIN A approx 8' with close chair follow and pt flexed significantly forward, reporting back pain and unable to stand upright. Nurse notified. Frequent vcs throughout for technique. VSS throughout on 2 L via [place]. Pt is making progress towards goals, discharge remains appropriate. OT will continue to follow.       If plan is discharge home, recommend the following:  A lot of help with bathing/dressing/bathroom   Equipment Recommendations  Other (comment) (defer to next venue of care)    Recommendations for Other Services      Precautions / Restrictions Precautions Precautions: Fall Recall of Precautions/Restrictions: Intact Restrictions Weight Bearing  Restrictions Per Provider Order: No       Mobility Bed Mobility Overal bed mobility: Needs Assistance Bed Mobility: Supine to Sit     Supine to sit: Min assist, HOB elevated          Transfers Overall transfer level: Needs assistance Equipment used: Rolling walker (2 wheels) Transfers: Sit to/from Stand Sit to Stand: Min assist, Mod assist (MIN A from raised bed height, MOD A for STS From chair +1 with RW)                 Balance Overall balance assessment: Needs assistance Sitting-balance support: Feet supported Sitting balance-Leahy Scale: Good     Standing balance support: Bilateral upper extremity supported, During functional activity, Reliant on assistive device for balance Standing balance-Leahy Scale: Fair Standing balance comment: forward flexion due to reported back pain- pt reports this is chronic but poor overall upright positioning on this date compared to previous attempts                           ADL either performed or assessed with clinical judgement   ADL Overall ADL's : Needs assistance/impaired     Grooming: Wash/dry face;Sitting;Set up       Lower Body Bathing: Maximal assistance;Sitting/lateral leans;Sit to/from stand           Toilet Transfer: Minimal assistance;Rolling walker (2 wheels) Toilet Transfer Details (indicate cue type and reason): simulated to bedside chair Toileting- Clothing Manipulation and Hygiene: Maximal assistance;Sit to/from stand Toileting - Clothing Manipulation Details (indicate cue type and reason): incontinent BM in bed     Functional mobility during ADLs: Minimal assistance;Rolling  walker (2 wheels) (approx 8' in room with +2 chair follow. Pt is significantly forward flexed, reporting chronic back pain is limiting her mobility on this date; This is not noted on evaluation with pt able to stand upright for tranfser on that date)      Extremity/Trunk Assessment              Vision Patient  Visual Report: No change from baseline     Perception     Praxis     Communication Communication Communication: No apparent difficulties   Cognition Arousal: Alert Behavior During Therapy: WFL for tasks assessed/performed Cognition: No family/caregiver present to determine baseline, Cognition impaired           Executive functioning impairment (select all impairments): Initiation, Sequencing, Problem solving OT - Cognition Comments: follows simple commands with increased time                 Following commands: Intact        Cueing   Cueing Techniques: Verbal cues, Tactile cues  Exercises Other Exercises Other Exercises: edu re: role of OT, role of rehab, discharge recommendations , safe ADL completion, importance of progressing mobility    Shoulder Instructions       General Comments      Pertinent Vitals/ Pain       Pain Assessment Pain Assessment: 0-10 Pain Score: 8  Pain Location: back pain when attempting to amb Pain Descriptors / Indicators: Discomfort, Grimacing Pain Intervention(s): Limited activity within patient's tolerance, Monitored during session, Repositioned  Home Living                                          Prior Functioning/Environment              Frequency  Min 1X/week        Progress Toward Goals  OT Goals(current goals can now be found in the care plan section)  Progress towards OT goals: Progressing toward goals  Acute Rehab OT Goals Time For Goal Achievement: 07/16/23  Plan      Co-evaluation    PT/OT/SLP Co-Evaluation/Treatment: Yes Reason for Co-Treatment: For patient/therapist safety;To address functional/ADL transfers;Other (comment) (optimize mobility)   OT goals addressed during session: ADL's and self-care      AM-PAC OT "6 Clicks" Daily Activity     Outcome Measure   Help from another person eating meals?: A Little Help from another person taking care of personal grooming?: A  Little Help from another person toileting, which includes using toliet, bedpan, or urinal?: A Lot Help from another person bathing (including washing, rinsing, drying)?: A Lot Help from another person to put on and taking off regular upper body clothing?: A Lot Help from another person to put on and taking off regular lower body clothing?: A Lot 6 Click Score: 14    End of Session Equipment Utilized During Treatment: Rolling walker (2 wheels);Oxygen  OT Visit Diagnosis: Other abnormalities of gait and mobility (R26.89);Muscle weakness (generalized) (M62.81);Unsteadiness on feet (R26.81)   Activity Tolerance Patient limited by pain   Patient Left in chair;with call bell/phone within reach;with chair alarm set   Nurse Communication Mobility status;Other (comment) (NT re status)        Time: 1610-9604 OT Time Calculation (min): 27 min  Charges: OT General Charges $OT Visit: 1 Visit OT Treatments $Self Care/Home Management : 8-22 mins  Tobi Bastos  Elina Streng, OTD OTR/L  07/07/23, 11:19 AM

## 2023-07-07 NOTE — Progress Notes (Signed)
Progress Note   Patient: [name] ZOX:[id number] DOB: Sep 08, 1940 DOA: 06/30/2023     6 DOS: the patient was seen and examined on 07/07/2023   Brief hospital course: Taken from H&P.  [name] is a 83 y.o. female with medical history significant for anxiety, asthma, PAD, COPD, coronary artery disease, gout, hypothyroidism, and hypertension, who presented to the emergency room with acute onset of generalized abdominal pain with associated distention.  The patient has a chronic rectal tube due to sigmoid stricture and has been noticing diminished output.  She had a watery bowel movement in the ER with no melena or bright red bleeding per rectum.  She is to diarrhea over the last few weeks.   Patient had a recent prolonged hospitalization at [facility] when she was admitted with concern of septic shock requiring pressors secondary to RSV, influenza A, urine cultures were positive for Klebsiella and E. coli and she received appropriate management.  Her hospital stay was also complicated with developing ileus which she was recovered before discharge.  Under sedation she had tachycardia and mild tachypnea, rest of the vitals within normal limits, labs pertinent for BUN 25 creatinine 1.15, WBC 11. EKG with sinus tachycardia, left anterior fascicular block, LVH and T wave inversion laterally. CT abdomen and pelvis some wall thickening and mucosal enhancement concerning for right lower quadrant enteritis and mild wall thickening and enhancement of the colon consistent with colitis. Incidental finding of nonobstructing right kidney stones. Bronchial wall thickening and mild mucous plugging in the right lower lobe with peribronchiolar hazy density with could be inflammatory.  Patient was started on ceftriaxone and Flagyl  2/12: Remained mildly tachycardic, leukocytosis resolved but all cell line decreased, creatinine with some improvement to 1.09, GFR improved to 51, baseline GFR more than  60, patient meeting the criteria for AKI.  BNP 74 Chest x-ray with streaky bibasilar opacities, may represent atelectasis versus pneumonia.  C. difficile and GI pathogen panel negative Respiratory panel for COVID, influenza and RSV negative.  Patient has positive influenza A and RSV 2 weeks ago. Holding diuretic which include Lasix, spironolactone and metolazone today. Giving some IV fluid.  2/13: Vital stable, labs with hypokalemia 2.9 which is being repleted, magnesium at 1.6-being repleted, AKI resolved. Continue to have abdominal pain and diarrhea-Lomotil added. PT is recommending SNF   Assessment and Plan:  Diarrhea Abdominal pain Sigmoid stricture Colitis  Ct showing possible ileitis/colitis, ongoing sigmoid stricture. GI has evaluated, says infection ruled out, can stop abx. Advises symptomatic treatment for the time being. Repeat ct shows ongoing stricture. Gen surg evaluated, thinks stricture likely 2/2 chronic inflammation from diverticulitis, advises stool softener to avoid obstruction, says risks of surgery are very high and would leave patient with a permanent colostomy, so avoiding that unless no other option. - will trial adding miralax today  Hypothyroidism Tsh wnl - Continue Synthroid.  Essential hypertension Controlled, low normal today - We will continue Lopressor but have decreased dose from 125 to 50 daily. -Holding home diuretic as she appears dry and normal BNP  Dyslipidemia - We will continue statin therapy.   Hypokalemia Wnl today  Peripheral neuropathy - We will continue Neurontin.  Gout - cont home allopurinol - Colchicine will be held off for diarrhea for now. - The patient has no current acute attack.  Chronic heart failure with preserved ejection fraction (HFpEF) (HCC) Last 2D echo in 2023 showed LVEF of 55% with grade 1 diastolic dysfunction, BNP within normal limit at 74  and clinically appears dry due to poor po intake. -Holding home  diuretics which include Lasix, metolazone and spironolactone. -Monitor volume status closely as patient is getting IV fluid  Chronic respiratory failure with hypoxia (HCC) With history of ILD.  Seems stable on her baseline oxygen requirement of 3 L. -Continue with home dose of prednisone 10 mg daily.    Rheumatoid arthritis (HCC) - We will continue her Plaquenil and leflunomide.   Subjective: ongoing intermittent lower abd pain, bm this morning  Physical Exam: Vitals:   07/06/23 0800 07/06/23 1602 07/07/23 0000 07/07/23 0709  BP: (!) 141/84 124/79 132/76 122/80  Pulse: 94 90 88 99  Resp: 19 20 18 16   Temp: 98.3 F (36.8 C) 98.3 F (36.8 C) 97.9 F (36.6 C) 97.8 F (36.6 C)  TempSrc:  Oral Oral Oral  SpO2: 100% 100% 100% 97%  Weight:      Height:       General.  Obese elderly lady, in no acute distress. Pulmonary.  Lungs clear bilaterally, normal respiratory effort. CV.  Regular rate and rhythm, no JVD, rub or murmur. Abdomen.  Soft, mild diffuse ttp worse lower quadrants, no rebound or guarding CNS.  Alert and oriented .  No focal neurologic deficit. Extremities.  No edema, no cyanosis, pulses intact and symmetrical.  Data Reviewed: Prior data reviewed  Family Communication: son curtis updated telephonically 2/18  Disposition: Status is: Inpatient Remains inpatient appropriate because: pending snf placement  Planned Discharge Destination: snf  DVT prophylaxis.  Lovenox    Author: Silvano Bilis, MD 07/07/2023 12:57 PM  For on call review [email or web address].

## 2023-07-08 ENCOUNTER — Other Ambulatory Visit: Payer: Medicare Other

## 2023-07-08 DIAGNOSIS — K529 Noninfective gastroenteritis and colitis, unspecified: Secondary | ICD-10-CM | POA: Diagnosis not present

## 2023-07-08 NOTE — TOC Progression Note (Addendum)
Transition of Care [facility]) - Progression Note    Patient Details  Name: [name] MRN: [id number] Date of Birth: 02-06-1941  [facility] CM/SW Contact  [name], RN Phone Number: 07/08/2023, 9:58 AM  Clinical Narrative:     Called [facility] in [place] and left a VM for the admissions director at [phone], accepted in the Hub I called daughter [name] at [phone] and left a general voice mail requesting a call back at earliest convenience   Expected Discharge Plan: Skilled Nursing Facility Barriers to Discharge: SNF Pending bed offer  Expected Discharge Plan and Services   Discharge Planning Services: CM Consult   Living arrangements for the past 2 months: Single Family Home                                       Social Determinants of Health (SDOH) Interventions SDOH Screenings   Food Insecurity: No Food Insecurity (07/01/2023)  Housing: Low Risk  (07/01/2023)  Transportation Needs: No Transportation Needs (07/01/2023)  Utilities: Not At Risk (07/01/2023)  Alcohol Screen: Low Risk  (10/27/2022)  Depression (PHQ2-9): High Risk (06/12/2023)  Financial Resource Strain: Low Risk  (06/11/2023)  Physical Activity: Inactive (06/11/2023)  Social Connections: Socially Integrated (07/01/2023)  Stress: No Stress Concern Present (10/27/2022)  Tobacco Use: Low Risk  (07/01/2023)    Readmission Risk Interventions    12/16/2022   11:09 AM  Readmission Risk Prevention Plan  Transportation Screening Complete  PCP or Specialist Appt within 5-7 Days Complete  Home Care Screening Complete  Medication Review (RN CM) Complete

## 2023-07-08 NOTE — Progress Notes (Addendum)
Mobility Specialist - Progress Note   07/08/23 1100  Mobility  Activity Stood at bedside;Transferred from bed to chair  Level of Assistance Minimal assist, patient does 75% or more  Assistive Device Front wheel walker  Distance Ambulated (ft) 2 ft  Range of Motion/Exercises Right leg;Left leg;Active  Activity Response Tolerated well  Mobility visit 1 Mobility     Pt lying in bed upon arrival, utilizing 2L. Pt agreeable to activity. Voiced pain in abdominal area and lower back 6/10. Completed bed mobility with minA. Participated in seated exercise once EOB. STS with minA. Attempted ambulation but deferred d/t pt tolerance and pain as pt is only able to take 2 steps to pivot towards recliner this date. Declined further mobility despite encouragement and chair follow. Very forward flexed posture in standing. RPE 8/10. Pt left in chair with alarm set, needs in reach. RN notified.   Filiberto Pinks Mobility Specialist 07/08/23, 11:19 AM

## 2023-07-08 NOTE — Plan of Care (Signed)
   Problem: Fluid Volume: Goal: Hemodynamic stability will improve Outcome: Progressing   Problem: Clinical Measurements: Goal: Diagnostic test results will improve Outcome: Progressing Goal: Signs and symptoms of infection will decrease Outcome: Progressing   Problem: Respiratory: Goal: Ability to maintain adequate ventilation will improve Outcome: Progressing   Problem: Education: Goal: Knowledge of General Education information will improve Description: Including pain rating scale, medication(s)/side effects and non-pharmacologic comfort measures Outcome: Progressing   Problem: Health Behavior/Discharge Planning: Goal: Ability to manage health-related needs will improve Outcome: Progressing   Problem: Clinical Measurements: Goal: Ability to maintain clinical measurements within normal limits will improve Outcome: Progressing Goal: Will remain free from infection Outcome: Progressing Goal: Diagnostic test results will improve Outcome: Progressing Goal: Respiratory complications will improve Outcome: Progressing Goal: Cardiovascular complication will be avoided Outcome: Progressing   Problem: Activity: Goal: Risk for activity intolerance will decrease Outcome: Progressing   Problem: Nutrition: Goal: Adequate nutrition will be maintained Outcome: Progressing   Problem: Coping: Goal: Level of anxiety will decrease Outcome: Progressing   Problem: Elimination: Goal: Will not experience complications related to bowel motility Outcome: Progressing Goal: Will not experience complications related to urinary retention Outcome: Progressing   Problem: Pain Managment: Goal: General experience of comfort will improve and/or be controlled Outcome: Progressing   Problem: Safety: Goal: Ability to remain free from injury will improve Outcome: Progressing   Problem: Skin Integrity: Goal: Risk for impaired skin integrity will decrease Outcome: Progressing

## 2023-07-08 NOTE — Progress Notes (Signed)
PROGRESS NOTE    [name]  NWG:[id number] DOB: 02-Aug-1940 DOA: 06/30/2023 PCP: [name], MD    Brief Narrative:     [name] is a 83 y.o. female with medical history significant for anxiety, asthma, PAD, COPD, coronary artery disease, gout, hypothyroidism, and hypertension, who presented to the emergency room with acute onset of generalized abdominal pain with associated distention.  The patient has a chronic rectal tube due to sigmoid stricture and has been noticing diminished output.  She had a watery bowel movement in the ER with no melena or bright red bleeding per rectum.  She is to diarrhea over the last few weeks.    Patient had a recent prolonged hospitalization at [facility] when she was admitted with concern of septic shock requiring pressors secondary to RSV, influenza A, urine cultures were positive for Klebsiella and E. coli and she received appropriate management.  Her hospital stay was also complicated with developing ileus which she was recovered before discharge.   Under sedation she had tachycardia and mild tachypnea, rest of the vitals within normal limits, labs pertinent for BUN 25 creatinine 1.15, WBC 11. EKG with sinus tachycardia, left anterior fascicular block, LVH and T wave inversion laterally. CT abdomen and pelvis some wall thickening and mucosal enhancement concerning for right lower quadrant enteritis and mild wall thickening and enhancement of the colon consistent with colitis. Incidental finding of nonobstructing right kidney stones. Bronchial wall thickening and mild mucous plugging in the right lower lobe with peribronchiolar hazy density with could be inflammatory.   Patient was started on ceftriaxone and Flagyl   2/12: Remained mildly tachycardic, leukocytosis resolved but all cell line decreased, creatinine with some improvement to 1.09, GFR improved to 51, baseline GFR more than 60, patient meeting the criteria for AKI.  BNP  74 Chest x-ray with streaky bibasilar opacities, may represent atelectasis versus pneumonia.  C. difficile and GI pathogen panel negative Respiratory panel for COVID, influenza and RSV negative.  Patient has positive influenza A and RSV 2 weeks ago. Holding diuretic which include Lasix, spironolactone and metolazone today. Giving some IV fluid.   2/13: Vital stable, labs with hypokalemia 2.9 which is being repleted, magnesium at 1.6-being repleted, AKI resolved. Continue to have abdominal pain and diarrhea-Lomotil added. PT is recommending SNF  2/19: Stable for discharge.  Per TOC bed will be available 2/20   Assessment & Plan:   Principal Problem:   Enterocolitis Active Problems:   Sepsis due to undetermined organism [facility])   Hypothyroidism   Essential hypertension   Dyslipidemia   Rheumatoid arthritis (HCC)   Chronic respiratory failure with hypoxia (HCC)   Chronic heart failure with preserved ejection fraction (HFpEF) (HCC)   Gout   GERD without esophagitis   Peripheral neuropathy Diarrhea Abdominal pain Sigmoid stricture Colitis  Ct showing possible ileitis/colitis, ongoing sigmoid stricture. GI has evaluated, says infection ruled out, can stop abx. Advises symptomatic treatment for the time being. Repeat ct shows ongoing stricture. Gen surg evaluated, thinks stricture likely 2/2 chronic inflammation from diverticulitis, advises stool softener to avoid obstruction, says risks of surgery are very high and would leave patient with a permanent colostomy, so avoiding that unless no other option. -Continue bowel regimen   Hypothyroidism Tsh wnl - Continue Synthroid.   Essential hypertension Controlled, low normal today - Continue Lopressor, reduce dose to 50 mg daily -Holding home diuretic as she appears dry and normal BNP restart on discharge   Dyslipidemia - We will  continue statin therapy.   Hypokalemia Wnl today   Peripheral neuropathy - We will continue  Neurontin.   Gout - cont home allopurinol - Colchicine will be held off for diarrhea for now. - The patient has no current acute attack.   Chronic heart failure with preserved ejection fraction (HFpEF) (HCC) Last 2D echo in 2023 showed LVEF of 55% with grade 1 diastolic dysfunction, BNP within normal limit at 74 and clinically appears dry due to poor po intake. -Holding home diuretics which include Lasix, metolazone and spironolactone. -Monitor volume status closely as patient is getting IV fluid   Chronic respiratory failure with hypoxia (HCC) With history of ILD.  Seems stable on her baseline oxygen requirement of 3 L. -Continue with home dose of prednisone 10 mg daily.     Rheumatoid arthritis (HCC) - We will continue her Plaquenil and leflunomide.   DVT prophylaxis: Lovenox Code Status: Full Family Communication:None Disposition Plan: Status is: Inpatient Remains inpatient appropriate because: Pending placement.  Medically ready.  Anticipate dc 2/20 to SNF   Level of care: Med-Surg  Consultants:  None  Procedures:  None  Antimicrobials: None    Subjective: Examined.  Resting in bed.  No distress.  Reports intermittent abdominal pain.  Objective: Vitals:   07/07/23 0709 07/07/23 1803 07/07/23 2342 07/08/23 0740  BP: 122/80 118/76 (!) 142/81 127/75  Pulse: 99 96 89 97  Resp: 16 18 16 15   Temp: 97.8 F (36.6 C) 98.9 F (37.2 C) 98.3 F (36.8 C) 98 F (36.7 C)  TempSrc: Oral Oral Oral Oral  SpO2: 97% 97% 100% 96%  Weight:      Height:        Intake/Output Summary (Last 24 hours) at 07/08/2023 1335 Last data filed at 07/08/2023 0300 Gross per 24 hour  Intake --  Output 600 ml  Net -600 ml   Filed Weights   06/30/23 1533  Weight: 90.7 kg    Examination:  General exam: Appears calm and comfortable  Respiratory system: Clear to auscultation. Respiratory effort normal. Cardiovascular system: S1 S2, RRR, no murmurs, no pedal edema Gastrointestinal  system: Soft, NT/ND, normal bowel sounds Central nervous system: Alert and oriented. No focal neurological deficits. Extremities: Symmetric 5 x 5 power. Skin: No rashes, lesions or ulcers Psychiatry: Judgement and insight appear normal. Mood & affect appropriate.     Data Reviewed: I have personally reviewed following labs and imaging studies  CBC: Recent Labs  Lab 07/02/23 0530 07/03/23 0527  WBC 7.1 5.2  HGB 11.0* 10.2*  HCT 36.0 32.7*  MCV 99.2 97.3  PLT 214 185   Basic Metabolic Panel: Recent Labs  Lab 07/02/23 0530 07/02/23 0912 07/03/23 0527 07/04/23 0449 07/05/23 0434 07/07/23 0630  NA 141  --  140 139 140 140  K 2.9*  --  3.1* 3.7 3.5 3.7  CL 104  --  106 105 105 106  CO2 28  --  28 28 28 26   GLUCOSE 120*  --  99 91 88 83  BUN 18  --  15 11 8 9   CREATININE 0.94  --  0.73 0.76 0.66 0.66  CALCIUM 8.1*  --  7.9* 8.0* 7.9* 8.0*  MG  --  1.6* 2.3  --   --   --    GFR: Estimated Creatinine Clearance: 58.9 mL/min (by C-G formula based on SCr of 0.66 mg/dL). Liver Function Tests: No results for input(s): "AST", "ALT", "ALKPHOS", "BILITOT", "PROT", "ALBUMIN" in the last 168 hours. No results  for input(s): "LIPASE", "AMYLASE" in the last 168 hours. No results for input(s): "AMMONIA" in the last 168 hours. Coagulation Profile: No results for input(s): "INR", "PROTIME" in the last 168 hours. Cardiac Enzymes: No results for input(s): "CKTOTAL", "CKMB", "CKMBINDEX", "TROPONINI" in the last 168 hours. BNP (last 3 results) No results for input(s): "PROBNP" in the last 8760 hours. HbA1C: No results for input(s): "HGBA1C" in the last 72 hours. CBG: No results for input(s): "GLUCAP" in the last 168 hours. Lipid Profile: No results for input(s): "CHOL", "HDL", "LDLCALC", "TRIG", "CHOLHDL", "LDLDIRECT" in the last 72 hours. Thyroid Function Tests: No results for input(s): "TSH", "T4TOTAL", "FREET4", "T3FREE", "THYROIDAB" in the last 72 hours. Anemia Panel: No results  for input(s): "VITAMINB12", "FOLATE", "FERRITIN", "TIBC", "IRON", "RETICCTPCT" in the last 72 hours. Sepsis Labs: No results for input(s): "PROCALCITON", "LATICACIDVEN" in the last 168 hours.  Recent Results (from the past 240 hours)  Blood Culture (routine x 2)     Status: None   Collection Time: 06/30/23  5:12 PM   Specimen: Right Antecubital; Blood  Result Value Ref Range Status   Specimen Description RIGHT ANTECUBITAL  Final   Special Requests   Final    BOTTLES DRAWN AEROBIC AND ANAEROBIC Blood Culture adequate volume   Culture   Final    NO GROWTH 5 DAYS Performed at [facility], [address] [place], [place] [address]    Report Status 07/05/2023 FINAL  Final  Blood Culture (routine x 2)     Status: None   Collection Time: 06/30/23  6:23 PM   Specimen: BLOOD LEFT ARM  Result Value Ref Range Status   Specimen Description BLOOD LEFT ARM  Final   Special Requests   Final    BOTTLES DRAWN AEROBIC AND ANAEROBIC Blood Culture results may not be optimal due to an inadequate volume of blood received in culture bottles   Culture   Final    NO GROWTH 5 DAYS Performed at [facility], [address] [place], [place] [address]    Report Status 07/05/2023 FINAL  Final  Gastrointestinal Panel by PCR , Stool     Status: None   Collection Time: 06/30/23  6:23 PM   Specimen: STOOL  Result Value Ref Range Status   Campylobacter species NOT DETECTED NOT DETECTED Final   Plesimonas shigelloides NOT DETECTED NOT DETECTED Final   Salmonella species NOT DETECTED NOT DETECTED Final   Yersinia enterocolitica NOT DETECTED NOT DETECTED Final   Vibrio species NOT DETECTED NOT DETECTED Final   Vibrio cholerae NOT DETECTED NOT DETECTED Final   Enteroaggregative E coli (EAEC) NOT DETECTED NOT DETECTED Final   Enteropathogenic E coli (EPEC) NOT DETECTED NOT DETECTED Final   Enterotoxigenic E coli (ETEC) NOT DETECTED NOT DETECTED Final   Shiga like toxin producing E coli  (STEC) NOT DETECTED NOT DETECTED Final   Shigella/Enteroinvasive E coli (EIEC) NOT DETECTED NOT DETECTED Final   Cryptosporidium NOT DETECTED NOT DETECTED Final   Cyclospora cayetanensis NOT DETECTED NOT DETECTED Final   Entamoeba histolytica NOT DETECTED NOT DETECTED Final   Giardia lamblia NOT DETECTED NOT DETECTED Final   Adenovirus F40/41 NOT DETECTED NOT DETECTED Final   Astrovirus NOT DETECTED NOT DETECTED Final   Norovirus GI/GII NOT DETECTED NOT DETECTED Final   Rotavirus A NOT DETECTED NOT DETECTED Final   Sapovirus (I, II, IV, and V) NOT DETECTED NOT DETECTED Final    Comment: Performed at [facility], [address] [place], [place] [address]  C Difficile Quick Screen w PCR reflex     Status: None   Collection Time: 06/30/23  6:25 PM   Specimen: STOOL  Result Value Ref Range Status   C Diff antigen NEGATIVE NEGATIVE Final   C Diff toxin NEGATIVE NEGATIVE Final   C Diff interpretation No C. difficile detected.  Final    Comment: Performed at [facility], [address] [place], [place] [address]  Resp panel by RT-PCR (RSV, Flu A&B, Covid) Anterior Nasal Swab     Status: None   Collection Time: 07/01/23  9:02 AM   Specimen: Anterior Nasal Swab  Result Value Ref Range Status   SARS Coronavirus 2 by RT PCR NEGATIVE NEGATIVE Final    Comment: (NOTE) SARS-CoV-2 target nucleic acids are NOT DETECTED.  The SARS-CoV-2 RNA is generally detectable in upper respiratory specimens during the acute phase of infection. The lowest concentration of SARS-CoV-2 viral copies this assay can detect is 138 copies/mL. A negative result does not preclude SARS-Cov-2 infection and should not be used as the sole basis for treatment or other patient management decisions. A negative result may occur with  improper specimen collection/handling, submission of specimen other than nasopharyngeal swab, presence of viral mutation(s) within the areas targeted by this assay, and  inadequate number of viral copies(<138 copies/mL). A negative result must be combined with clinical observations, patient history, and epidemiological information. The expected result is Negative.  Fact Sheet for Patients:  BloggerCourse.com  Fact Sheet for Healthcare Providers:  SeriousBroker.it  This test is no t yet approved or cleared by the [place] FDA and  has been authorized for detection and/or diagnosis of SARS-CoV-2 by FDA under an Emergency Use Authorization (EUA). This EUA will remain  in effect (meaning this test can be used) for the duration of the COVID-19 declaration under Section 564(b)(1) of the Act, 21 U.S.C.section 360bbb-3(b)(1), unless the authorization is terminated  or revoked sooner.       Influenza A by PCR NEGATIVE NEGATIVE Final   Influenza B by PCR NEGATIVE NEGATIVE Final    Comment: (NOTE) The Xpert Xpress SARS-CoV-2/FLU/RSV plus assay is intended as an aid in the diagnosis of influenza from Nasopharyngeal swab specimens and should not be used as a sole basis for treatment. Nasal washings and aspirates are unacceptable for Xpert Xpress SARS-CoV-2/FLU/RSV testing.  Fact Sheet for Patients: BloggerCourse.com  Fact Sheet for Healthcare Providers: SeriousBroker.it  This test is not yet approved or cleared by the [place] FDA and has been authorized for detection and/or diagnosis of SARS-CoV-2 by FDA under an Emergency Use Authorization (EUA). This EUA will remain in effect (meaning this test can be used) for the duration of the COVID-19 declaration under Section 564(b)(1) of the Act, 21 U.S.C. section 360bbb-3(b)(1), unless the authorization is terminated or revoked.     Resp Syncytial Virus by PCR NEGATIVE NEGATIVE Final    Comment: (NOTE) Fact Sheet for Patients: BloggerCourse.com  Fact Sheet for Healthcare  Providers: SeriousBroker.it  This test is not yet approved or cleared by the [place] FDA and has been authorized for detection and/or diagnosis of SARS-CoV-2 by FDA under an Emergency Use Authorization (EUA). This EUA will remain in effect (meaning this test can be used) for the duration of the COVID-19 declaration under Section 564(b)(1) of the Act, 21 U.S.C. section 360bbb-3(b)(1), unless the authorization is terminated or revoked.  Performed at [facility], [address] [place], [place] [address]  Radiology Studies: No results found.      Scheduled Meds:  allopurinol  300 mg Oral Daily   atorvastatin  10 mg Oral QHS   azithromycin  250 mg Oral Q M,W,F   cholecalciferol  2,000 Units Oral Daily   donepezil  10 mg Oral QHS   DULoxetine  60 mg Oral Daily   enoxaparin (LOVENOX) injection  0.5 mg/kg Subcutaneous Q24H   gabapentin  300 mg Oral BID   guaiFENesin  600 mg Oral BID   hydroxychloroquine  200 mg Oral Daily   leflunomide  20 mg Oral Daily   levothyroxine  50 mcg Oral Q0600   lidocaine  1 patch Transdermal Q24H   metoprolol succinate  50 mg Oral Daily   morphine  15 mg Oral Q12H   polyethylene glycol  17 g Oral Daily   predniSONE  10 mg Oral Q breakfast   Continuous Infusions:   LOS: 7 days       Tresa Moore, MD [facility]   If 7PM-7AM, please contact night-coverage  07/08/2023, 1:35 PM

## 2023-07-08 NOTE — TOC Progression Note (Signed)
Transition of Care [facility]) - Progression Note    Patient Details  Name: [name] MRN: [id number] Date of Birth: 04-29-1941  Transition of Care [facility]) CM/SW Contact  [name], RN Phone Number: 07/08/2023, 12:50 PM  Clinical Narrative:    [name] with Edwena Blow at [facility], they have confirmed that they can accept the patient tomorrow   Expected Discharge Plan: Skilled Nursing Facility Barriers to Discharge: SNF Pending bed offer  Expected Discharge Plan and Services   Discharge Planning Services: CM Consult   Living arrangements for the past 2 months: Single Family Home                                       Social Determinants of Health (SDOH) Interventions SDOH Screenings   Food Insecurity: No Food Insecurity (07/01/2023)  Housing: Low Risk  (07/01/2023)  Transportation Needs: No Transportation Needs (07/01/2023)  Utilities: Not At Risk (07/01/2023)  Alcohol Screen: Low Risk  (10/27/2022)  Depression (PHQ2-9): High Risk (06/12/2023)  Financial Resource Strain: Low Risk  (06/11/2023)  Physical Activity: Inactive (06/11/2023)  Social Connections: Socially Integrated (07/01/2023)  Stress: No Stress Concern Present (10/27/2022)  Tobacco Use: Low Risk  (07/01/2023)    Readmission Risk Interventions    07/08/2023   10:09 AM 12/16/2022   11:09 AM  Readmission Risk Prevention Plan  Transportation Screening Complete Complete  PCP or Specialist Appt within 5-7 Days  Complete  Home Care Screening  Complete  Medication Review (RN CM)  Complete  Medication Review (RN Care Manager) Referral to Pharmacy   PCP or Specialist appointment within 3-5 days of discharge Complete   HRI or Home Care Consult Complete   Palliative Care Screening Not Applicable   Skilled Nursing Facility Complete

## 2023-07-09 DIAGNOSIS — K529 Noninfective gastroenteritis and colitis, unspecified: Secondary | ICD-10-CM | POA: Diagnosis not present

## 2023-07-09 MED ORDER — OXYCODONE HCL 5 MG PO TABS: 5.0000 mg | ORAL_TABLET | Freq: Four times a day (QID) | ORAL | 0 refills | Status: DC | PRN

## 2023-07-09 MED ORDER — MORPHINE SULFATE ER 15 MG PO TBCR: 15.0000 mg | EXTENDED_RELEASE_TABLET | Freq: Two times a day (BID) | ORAL | 0 refills | Status: DC

## 2023-07-09 MED ORDER — DIPHENOXYLATE-ATROPINE 2.5-0.025 MG PO TABS: 1.0000 | ORAL_TABLET | Freq: Four times a day (QID) | ORAL | Status: DC | PRN

## 2023-07-09 MED ORDER — METOPROLOL SUCCINATE ER 50 MG PO TB24: 50.0000 mg | ORAL_TABLET | Freq: Every day | ORAL | Status: DC

## 2023-07-09 NOTE — TOC Progression Note (Signed)
Transition of Care [facility]) - Progression Note    Patient Details  Name: [name] MRN: [id number] Date of Birth: 05-23-1940  Transition of Care [facility]) CM/SW Contact  [name], RN Phone Number: 07/09/2023, 11:29 AM  Clinical Narrative:     [facility], they asked that we hold DC until tomorrow They have several covid patients that will come off precautions tomorrow, I notified the physician   Expected Discharge Plan: Skilled Nursing Facility Barriers to Discharge: SNF Pending bed offer  Expected Discharge Plan and Services   Discharge Planning Services: CM Consult   Living arrangements for the past 2 months: Single Family Home Expected Discharge Date: 07/09/23                                     Social Determinants of Health (SDOH) Interventions SDOH Screenings   Food Insecurity: No Food Insecurity (07/01/2023)  Housing: Low Risk  (07/01/2023)  Transportation Needs: No Transportation Needs (07/01/2023)  Utilities: Not At Risk (07/01/2023)  Alcohol Screen: Low Risk  (10/27/2022)  Depression (PHQ2-9): High Risk (06/12/2023)  Financial Resource Strain: Low Risk  (06/11/2023)  Physical Activity: Inactive (06/11/2023)  Social Connections: Socially Integrated (07/01/2023)  Stress: No Stress Concern Present (10/27/2022)  Tobacco Use: Low Risk  (07/01/2023)    Readmission Risk Interventions    07/08/2023   10:09 AM 12/16/2022   11:09 AM  Readmission Risk Prevention Plan  Transportation Screening Complete Complete  PCP or Specialist Appt within 5-7 Days  Complete  Home Care Screening  Complete  Medication Review (RN CM)  Complete  Medication Review (RN Care Manager) Referral to Pharmacy   PCP or Specialist appointment within 3-5 days of discharge Complete   HRI or Home Care Consult Complete   Palliative Care Screening Not Applicable   Skilled Nursing Facility Complete

## 2023-07-09 NOTE — Discharge Summary (Signed)
Physician Discharge Summary  [name] ZOX:[id number] DOB: 1941-02-16 DOA: 06/30/2023  PCP: [name], MD  Admit date: 06/30/2023 Discharge date: 07/10/2023  Admitted From: Home Disposition:  SNF  Recommendations for Outpatient Follow-up:  Follow up with PCP in 1-2 weeks   Home Health:No  Equipment/Devices:Oxygen 3L via [place]   Discharge Condition:Stable  CODE STATUS:FULL  Diet recommendation: Heart healthy  Brief/Interim Summary:  [name] is a 83 y.o. female with medical history significant for anxiety, asthma, PAD, COPD, coronary artery disease, gout, hypothyroidism, and hypertension, who presented to the emergency room with acute onset of generalized abdominal pain with associated distention.  The patient has a chronic rectal tube due to sigmoid stricture and has been noticing diminished output.  She had a watery bowel movement in the ER with no melena or bright red bleeding per rectum.  She is to diarrhea over the last few weeks.    Patient had a recent prolonged hospitalization at [facility] when she was admitted with concern of septic shock requiring pressors secondary to RSV, influenza A, urine cultures were positive for Klebsiella and E. coli and she received appropriate management.  Her hospital stay was also complicated with developing ileus which she was recovered before discharge.   Under sedation she had tachycardia and mild tachypnea, rest of the vitals within normal limits, labs pertinent for BUN 25 creatinine 1.15, WBC 11. EKG with sinus tachycardia, left anterior fascicular block, LVH and T wave inversion laterally. CT abdomen and pelvis some wall thickening and mucosal enhancement concerning for right lower quadrant enteritis and mild wall thickening and enhancement of the colon consistent with colitis. Incidental finding of nonobstructing right kidney stones. Bronchial wall thickening and mild mucous plugging in the right lower lobe with  peribronchiolar hazy density with could be inflammatory.   Patient was started on ceftriaxone and Flagyl   2/12: Remained mildly tachycardic, leukocytosis resolved but all cell line decreased, creatinine with some improvement to 1.09, GFR improved to 51, baseline GFR more than 60, patient meeting the criteria for AKI.  BNP 74 Chest x-ray with streaky bibasilar opacities, may represent atelectasis versus pneumonia.  C. difficile and GI pathogen panel negative Respiratory panel for COVID, influenza and RSV negative.  Patient has positive influenza A and RSV 2 weeks ago. Holding diuretic which include Lasix, spironolactone and metolazone today. Giving some IV fluid.   2/13: Vital stable, labs with hypokalemia 2.9 which is being repleted, magnesium at 1.6-being repleted, AKI resolved. Continue to have abdominal pain and diarrhea-Lomotil added. PT is recommending SNF      Discharge Diagnoses:  Principal Problem:   Enterocolitis Active Problems:   Sepsis due to undetermined organism (HCC)   Hypothyroidism   Essential hypertension   Dyslipidemia   Rheumatoid arthritis (HCC)   Chronic respiratory failure with hypoxia (HCC)   Chronic heart failure with preserved ejection fraction (HFpEF) (HCC)   Gout   GERD without esophagitis   Peripheral neuropathy  Diarrhea Abdominal pain Sigmoid stricture Colitis  Ct showing possible ileitis/colitis, ongoing sigmoid stricture. GI has evaluated, says infection ruled out, can stop abx. Advises symptomatic treatment for the time being. Repeat ct shows ongoing stricture. Gen surg evaluated, thinks stricture likely 2/2 chronic inflammation from diverticulitis, advises stool softener to avoid obstruction, says risks of surgery are very high and would leave patient with a permanent colostomy, so avoiding that unless no other option.  Diarrhea noted on dc.  Lomotil prn added   Hypothyroidism Tsh wnl - Continue  Synthroid.   Essential  hypertension Controlled, low normal today - We will continue Lopressor but have decreased dose from 125 to 50 daily. -Resume home diuretic and dc    Gout - cont home allopurinol - Colchicine will be held off for diarrhea for now.    Chronic heart failure with preserved ejection fraction (HFpEF) (HCC) Last 2D echo in 2023 showed LVEF of 55% with grade 1 diastolic dysfunction, BNP within normal limit at 74 and clinically appears dry due to poor po intake. -Resume home diuretic on dc   Chronic respiratory failure with hypoxia (HCC) With history of ILD.  Seems stable on her baseline oxygen requirement of 3 L. -Continue with home dose of prednisone 10 mg daily.     Rheumatoid arthritis (HCC) - We will continue her Plaquenil and leflunomide.  Discharge Instructions  Discharge Instructions     Diet - low sodium heart healthy   Complete by: As directed    Increase activity slowly   Complete by: As directed    No wound care   Complete by: As directed       Allergies as of 07/10/2023       Reactions   Codeine Swelling, Other (See Comments)   Facial swelling, Chest pain, and swelling in legs   Infliximab Anaphylaxis   (REMICADE) "sent me into shock"   Lisinopril Swelling, Rash   Face and neck swelling   Ofev [nintedanib] Diarrhea        Medication List     PAUSE taking these medications    colchicine 0.6 MG tablet Wait to take this until your doctor or other care provider tells you to start again. Take 1 tablet (0.6 mg total) by mouth daily as needed (gout flares).       STOP taking these medications    acetaminophen 650 MG suppository Commonly known as: TYLENOL       TAKE these medications    albuterol 108 (90 Base) MCG/ACT inhaler Commonly known as: VENTOLIN HFA Inhale 2 puffs into the lungs as needed for wheezing or shortness of breath. What changed: Another medication with the same name was removed. Continue taking this medication, and follow the  directions you see here.   allopurinol 300 MG tablet Commonly known as: ZYLOPRIM Take 1 tablet (300 mg total) by mouth daily.   atorvastatin 10 MG tablet Commonly known as: LIPITOR Take 1 tablet (10 mg total) by mouth daily. What changed: when to take this   azithromycin 250 MG tablet Commonly known as: ZITHROMAX Take 1 tablet (250 mg total) by mouth every Monday, Wednesday, and Friday. Take every Monday, Wednesday, Friday.   CALCIUM + D3 PO Take 2 tablets by mouth daily.   diclofenac Sodium 1 % Gel Commonly known as: VOLTAREN Apply 2 g topically 3 (three) times daily. Apply to both hands and knees What changed:  how much to take when to take this additional instructions   dicyclomine 20 MG tablet Commonly known as: BENTYL Take 1 tablet (20 mg total) by mouth 4 (four) times daily -  before meals and at bedtime.   diphenoxylate-atropine 2.5-0.025 MG tablet Commonly known as: LOMOTIL Take 1 tablet by mouth 4 (four) times daily as needed for diarrhea or loose stools.   docusate sodium 100 MG capsule Commonly known as: COLACE Take 2 capsules (200 mg total) by mouth 2 (two) times daily. Hold for diarrhea.   donepezil 10 MG tablet Commonly known as: ARICEPT Take 1 tablet (10 mg total) by  mouth at bedtime.   DRY EYES OP Place 2 drops into both eyes as needed (dry eyes).   DULoxetine 60 MG capsule Commonly known as: CYMBALTA Take 1 capsule (60 mg total) by mouth daily.   fluticasone 50 MCG/ACT nasal spray Commonly known as: FLONASE Place 1 spray into both nostrils daily.   furosemide 20 MG tablet Commonly known as: LASIX Take 3 tablets (60 mg total) by mouth daily. What changed: how much to take   gabapentin 600 MG tablet Commonly known as: NEURONTIN Take 0.5 tablets (300 mg total) by mouth 2 (two) times daily. What changed:  how much to take when to take this   guaiFENesin 600 MG 12 hr tablet Commonly known as: MUCINEX Take 2 tablets (1,200 mg total) by  mouth 2 (two) times daily.   hydrALAZINE 25 MG tablet Commonly known as: APRESOLINE Take 25 mg by mouth 2 (two) times daily.   hydroxychloroquine 200 MG tablet Commonly known as: PLAQUENIL Take 1 tablet (200 mg total) by mouth daily.   leflunomide 20 MG tablet Commonly known as: ARAVA Take 1 tablet (20 mg total) by mouth daily.   levothyroxine 50 MCG tablet Commonly known as: SYNTHROID Take 1 tablet (50 mcg total) by mouth daily.   lidocaine 5 % Commonly known as: LIDODERM Place 1 patch onto the skin daily. Remove & Discard patch within 12 hours or as directed by MD What changed:  when to take this additional instructions   Linzess 72 MCG capsule Generic drug: linaclotide Take 72 mcg by mouth daily before breakfast.   metolazone 2.5 MG tablet Commonly known as: ZAROXOLYN TAKE 1 TABLET BY MOUTH DAILY AS NEEDED FOR 2 DAYS FOR WEIGHT ABOVE 255 POUNDS   metoprolol succinate 50 MG 24 hr tablet Commonly known as: TOPROL-XL Take 1 tablet (50 mg total) by mouth daily. Take with or immediately following a meal. What changed:  medication strength how much to take Another medication with the same name was removed. Continue taking this medication, and follow the directions you see here.   morphine 15 MG 12 hr tablet Commonly known as: MS CONTIN Take 1 tablet (15 mg total) by mouth every 12 (twelve) hours. SNF use only What changed: additional instructions   multivitamin with minerals Tabs tablet Take 1 tablet by mouth daily.   olmesartan 40 MG tablet Commonly known as: BENICAR Take 40 mg by mouth daily.   ondansetron 4 MG disintegrating tablet Commonly known as: ZOFRAN-ODT Take 1 tablet (4 mg total) by mouth every 8 (eight) hours as needed for nausea or vomiting.   oxyCODONE 5 MG immediate release tablet Commonly known as: Oxy IR/ROXICODONE Take 1 tablet (5 mg total) by mouth every 6 (six) hours as needed for moderate pain (pain score 4-6). SNF use only What changed:  additional instructions   OXYGEN Inhale 3 L into the lungs See admin instructions. 3 L at bedtime, and 3 L during the day as needed for exertion   Ozempic (0.25 or 0.5 MG/DOSE) 2 MG/1.5ML Sopn Generic drug: Semaglutide(0.25 or 0.5MG /DOS) Inject 0.5 mg into the skin once a week.   pantoprazole 40 MG tablet Commonly known as: PROTONIX Take 1 tablet (40 mg total) by mouth 2 (two) times daily.   polyethylene glycol 17 g packet Commonly known as: MIRALAX / GLYCOLAX Take 17 g by mouth 2 (two) times daily. Hold for diarrhea   Potassium Chloride ER 20 MEQ Tbcr Please take 20 mg p.o. twice daily for first 3 days, then go  back to 20 mg oral daily, monitor frequently and adjust as needed What changed:  how much to take how to take this when to take this   predniSONE 10 MG tablet Commonly known as: DELTASONE Take 1 tablet (10 mg total) by mouth daily with breakfast.   SALONPAS EX Apply 1 patch topically as needed (shoulder and leg pain).   simethicone 80 MG chewable tablet Commonly known as: MYLICON Chew 1 tablet (80 mg total) by mouth every 6 (six) hours as needed for flatulence.   Vitamin D3 50 MCG (2000 UT) capsule Take 2,000 Units by mouth daily.        Contact information for after-discharge care     Destination     [facility] Preferred SNF .   Service: Skilled Nursing Contact information: [address] [place] [address] [phone]                    Allergies  Allergen Reactions   Codeine Swelling and Other (See Comments)    Facial swelling, Chest pain, and swelling in legs    Infliximab Anaphylaxis    (REMICADE) "sent me into shock"    Lisinopril Swelling and Rash    Face and neck swelling    Ofev [Nintedanib] Diarrhea    Consultations: None   Procedures/Studies: CT ABDOMEN PELVIS W CONTRAST Result Date: 07/05/2023 CLINICAL DATA:  Right lower quadrant abdominal pain. EXAM: CT  ABDOMEN AND PELVIS WITH CONTRAST TECHNIQUE: Multidetector CT imaging of the abdomen and pelvis was performed using the standard protocol following bolus administration of intravenous contrast. RADIATION DOSE REDUCTION: This exam was performed according to the departmental dose-optimization program which includes automated exposure control, adjustment of the mA and/or kV according to patient size and/or use of iterative reconstruction technique. CONTRAST:  OMNIPAQUE IOHEXOL 300 MG/ML  SOLN COMPARISON:  CT abdomen pelvis dated 06/30/2023. FINDINGS: Lower chest: There is bilateral bronchiectasis and reticulation. Hepatobiliary: No focal liver abnormality is seen. Layering density in the gallbladder may reflect sludge, small gallstones, or vicarious excretion of contrast. No gallbladder wall thickening or biliary dilatation. Pancreas: Unremarkable. No pancreatic ductal dilatation or surrounding inflammatory changes. Spleen: Normal in size without focal abnormality. Adrenals/Urinary Tract: Adrenal glands are unremarkable. Nonobstructive right renal calculi measure up to 4 mm in size. Bilateral renal cysts measure up to 6 cm on the right. No imaging follow-up is recommended for this finding. No hydronephrosis on either side. Bladder is unremarkable. Stomach/Bowel: Stomach is within normal limits. There is wall thickening of the transverse, descending, and proximal sigmoid colon, similar to prior exam. No significant surrounding inflammatory changes. There is caliber change to decompressed distal sigmoid colon in the pelvis which appears more pronounced than on prior exam. The sigmoid colon is in close proximity to the left ovary and the bladder and there appears to be some degree of tethering. The appendix is not identified and may be surgically absent. Vascular/Lymphatic: Aortic atherosclerosis. No enlarged abdominal or pelvic lymph nodes. Reproductive: Status post hysterectomy. Other: No abdominal wall hernia or  abnormality. No abdominopelvic ascites. Musculoskeletal: Severe degenerative changes are seen in the spine. Fixation hardware is seen in the proximal left femur. IMPRESSION: 1. Wall thickening of the transverse, descending, and proximal sigmoid colon, similar to prior exam. There is caliber change to decompressed distal sigmoid colon in the pelvis which appears more pronounced than on prior exam. There is apparent tethering of the sigmoid colon to the bladder and ovary which  may result in some degree of constriction of the sigmoid colon. Aortic Atherosclerosis (ICD10-I70.0). Electronically Signed   By: Romona Curls M.D.   On: 07/05/2023 17:57   DG Chest 2 View Result Date: 07/01/2023 CLINICAL DATA:  [id number] Sepsis [facility]) [id number] EXAM: CHEST - 2 VIEW COMPARISON:  06/17/2023, 10/30/2021 FINDINGS: The heart size and mediastinal contours are within normal limits. Low lung volumes. Streaky bibasilar opacities, similar in appearance to the previous study. Upper lung zones are clear. No pleural effusion or pneumothorax. The visualized skeletal structures are unremarkable. IMPRESSION: Low lung volumes with streaky bibasilar opacities, similar in appearance to the previous study. Findings may represent atelectasis versus pneumonia. Electronically Signed   By: Duanne Guess D.O.   On: 07/01/2023 11:20   CT ABDOMEN PELVIS W CONTRAST Result Date: 06/30/2023 CLINICAL DATA:  Abdomen pain left lower quadrant pain EXAM: CT ABDOMEN AND PELVIS WITH CONTRAST TECHNIQUE: Multidetector CT imaging of the abdomen and pelvis was performed using the standard protocol following bolus administration of intravenous contrast. RADIATION DOSE REDUCTION: This exam was performed according to the departmental dose-optimization program which includes automated exposure control, adjustment of the mA and/or kV according to patient size and/or use of iterative reconstruction technique. CONTRAST:  OMNIPAQUE IOHEXOL 300 MG/ML  SOLN  COMPARISON:  Radiograph 06/26/2023, CT 12/15/2022, 03/01/2022 FINDINGS: Lower chest: Lung bases demonstrate bronchial wall thickening and mild mucous plugging in the right lower lobe. Peribronchovascular hazy density could be inflammatory. Hepatobiliary: No focal liver abnormality is seen. No gallstones, gallbladder wall thickening, or biliary dilatation. Pancreas: Unremarkable. No pancreatic ductal dilatation or surrounding inflammatory changes. Spleen: Normal in size without focal abnormality. Adrenals/Urinary Tract: Adrenal glands are within normal limits. Kidneys show no hydronephrosis. Multiple renal cysts for which no imaging follow-up is recommended. Small nonobstructing right kidney stones. Decompressed urinary bladder with diffuse wall thickening Stomach/Bowel: Stomach nonenlarged. Thickened small bowel loops. In the right lower quadrant with mucosal enhancement. Fluid within the colon. Air distension of cecum. Wall thickening and mucosal enhancement of the: With some inflammation surrounding the descending colon. Caliber change of the colon in the pelvis in the region of the thickened bladder and left ovary is seen on prior exams, series 2, image 69 through 74. No frank gas within the urinary bladder at this time. Vascular/Lymphatic: Aortic atherosclerosis. No enlarged abdominal or pelvic lymph nodes. Reproductive: Hysterectomy. Left ovary tethered to a loop of sigmoid colon and bladder. Other: Negative for pelvic effusion or free air. Musculoskeletal: Multilevel degenerative changes. No acute osseous abnormality IMPRESSION: 1. Wall thickening and mucosal enhancement of small bowel in the right lower quadrant suspicious for enteritis. Mild air distension of the colon with areas of wall thickening and mucosal enhancement consistent with colitis. Caliber change in the pelvis at the level of the distal sigmoid colon where there is tethered appearance of the sigmoid colon to a thickened bladder and the left  ovary as seen on prior exams. Findings raise concern for possible colon stricture and mild upstream obstruction. Concern previously raised for colovesical fistula, no gas in the bladder on today's study. 2. Nonobstructing right kidney stones. 3. Bronchial wall thickening and mild mucous plugging in the right lower lobe with peribronchovascular hazy density which could be inflammatory. 4. Aortic atherosclerosis. Aortic Atherosclerosis (ICD10-I70.0). Electronically Signed   By: Jasmine Pang M.D.   On: 06/30/2023 19:19   DG Abd Portable 1V Result Date: 06/26/2023 CLINICAL DATA:  Follow-up ileus EXAM: PORTABLE ABDOMEN - 1 VIEW COMPARISON:  06/24/2023 FINDINGS: Scattered large and  small bowel gas is noted. The degree of colonic distention has improved when compared with the prior exam. No free air is seen. No abnormal mass is noted. Postsurgical changes in the left hip are again seen. IMPRESSION: Decrease in the degree of gaseous distension of the colon. No free air is seen. Electronically Signed   By: Alcide Clever M.D.   On: 06/26/2023 11:33   DG Abd 1 View Result Date: 06/24/2023 CLINICAL DATA:  Ileus. EXAM: ABDOMEN - 1 VIEW COMPARISON:  Abdominal radiograph dated 06/23/2023. FINDINGS: Dilated small and large bowel as seen on the prior radiograph. No free air identified. Degenerative changes of the spine. No acute osseous pathology. IMPRESSION: Persistent air distention of the small and large bowel. Electronically Signed   By: Elgie Collard M.D.   On: 06/24/2023 14:18   DG Abd Portable 1V Result Date: 06/23/2023 CLINICAL DATA:  83 year old female is constipated. EXAM: PORTABLE ABDOMEN - 1 VIEW COMPARISON:  06/22/2023 radiographs and earlier. FINDINGS: Portable AP supine view at 0752 hours. Gas Phil large and small bowel loops throughout the abdomen and pelvis redemonstrated, similar to multiple abdominal radiographs since 06/17/2023. No significant retained stool. Lung bases appear negative. Stable visualized  osseous structures. Previous left femur ORIF. IMPRESSION: Ongoing gas distended small and large bowel loops. Differential considerations are ileus versus distal colonic obstruction (less likely given rectal gas on 06/20/2023 comparison. No significant retained stool. Electronically Signed   By: Odessa Fleming M.D.   On: 06/23/2023 08:45   DG Abd Portable 1V Result Date: 06/22/2023 CLINICAL DATA:  Constipation EXAM: PORTABLE ABDOMEN - 1 VIEW COMPARISON:  Two days ago FINDINGS: Generalized gaseous distension of colon and to a lesser extent affecting small bowel. No abnormal stool retention. No concerning mass effect or gas collection. The largest segment of gas dilated bowel is measured at the cecum, 11 cm and previously 9.4 cm. No clear volvulus pattern. IMPRESSION: Unchanged pattern of generalized gaseous distension of bowel. Greatest distension at the cecum where there is 11 cm in diameter, mildly progressed. Provided history of constipation, which is not seen radiographically. Electronically Signed   By: Tiburcio Pea M.D.   On: 06/22/2023 10:37   DG Abd 1 View Result Date: 06/20/2023 CLINICAL DATA:  Abdominal pain EXAM: ABDOMEN - 1 VIEW COMPARISON:  Two days ago FINDINGS: Diffuse gaseous distension of colon measuring up to 9.5 cm in diameter proximally. No abnormal stool retention or visible pneumatosis. No evidence of small-bowel obstruction. Reticulation at the lung bases is stable. IMPRESSION: Unchanged diffuse gaseous distension of the colon suggesting adynamic ileus. Electronically Signed   By: Tiburcio Pea M.D.   On: 06/20/2023 06:36   DG Abd Portable 1V Result Date: 06/17/2023 CLINICAL DATA:  Shortness of breath and abdominal pain EXAM: PORTABLE ABDOMEN - 1 VIEW COMPARISON:  12/15/2022 FINDINGS: Diffuse gaseous distension of colon measuring at least 7.5 cm at the distal transverse segment, proximal colon possibly measuring up to 10 cm in diameter. No evidence of pneumatosis or small bowel obstruction.  No concerning intra-abdominal mass effect or calcification. IMPRESSION: Gas distended colon, usually ileus. Electronically Signed   By: Tiburcio Pea M.D.   On: 06/17/2023 07:00   DG Chest Port 1 View Result Date: 06/17/2023 CLINICAL DATA:  Shortness of breath EXAM: PORTABLE CHEST 1 VIEW COMPARISON:  Four days ago FINDINGS: Indistinct density in the lower lungs which is stable. There is chronic lung disease by prior chest CT at the bases. Heart size is stable when allowing for significant  distortion from rotation. Trace pleural fluid is possible on the right. No pneumothorax. IMPRESSION: Stable bilateral pulmonary opacity at least partially related to chronic lung disease by prior chest CT, with possible superimposed pneumonia. Electronically Signed   By: Tiburcio Pea M.D.   On: 06/17/2023 06:58   ECHOCARDIOGRAM COMPLETE Result Date: 06/15/2023    ECHOCARDIOGRAM REPORT   Patient Name:   MARIBELLE HOPPLE Date of Exam: 06/15/2023 Medical Rec #:  [id number]         Height:       64.0 in Accession #:    [id number]        Weight:       234.6 lb Date of Birth:  08-05-1940         BSA:          2.094 m Patient Age:    82 years          BP:           161/86 mmHg Patient Gender: F                 HR:           88 bpm. Exam Location:  Inpatient Procedure: 2D Echo, Cardiac Doppler and Color Doppler Indications:    Shock R57.9  History:        Patient has prior history of Echocardiogram examinations, most                 recent 11/06/2021. Risk Factors:Hypertension, Diabetes,                 Dyslipidemia and Non-Smoker.  Sonographer:    Dondra Prader RVT RCS Referring Phys: [id number] Patrici Ranks  Sonographer Comments: Image acquisition challenging due to uncooperative patient. Patient asked for exam to end. Patient unable to lie still. IMPRESSIONS  1. Left ventricular ejection fraction, by estimation, is 60 to 65%. The left ventricle has normal function. The left ventricle has no regional wall motion abnormalities.  Left ventricular diastolic parameters are consistent with Grade I diastolic dysfunction (impaired relaxation).  2. Right ventricular systolic function is normal. The right ventricular size is normal. Tricuspid regurgitation signal is inadequate for assessing PA pressure.  3. The mitral valve is normal in structure. No evidence of mitral valve regurgitation. No evidence of mitral stenosis.  4. The aortic valve is tricuspid. There is mild calcification of the aortic valve. Aortic valve regurgitation is not visualized. No aortic stenosis is present.  5. IVC not visualized.  6. Exam truncated due to patient noncompliance. FINDINGS  Left Ventricle: Left ventricular ejection fraction, by estimation, is 60 to 65%. The left ventricle has normal function. The left ventricle has no regional wall motion abnormalities. The left ventricular internal cavity size was normal in size. There is  no left ventricular hypertrophy. Left ventricular diastolic parameters are consistent with Grade I diastolic dysfunction (impaired relaxation). Right Ventricle: The right ventricular size is normal. No increase in right ventricular wall thickness. Right ventricular systolic function is normal. Tricuspid regurgitation signal is inadequate for assessing PA pressure. Left Atrium: Left atrial size was normal in size. Right Atrium: Right atrial size was normal in size. Pericardium: There is no evidence of pericardial effusion. Mitral Valve: The mitral valve is normal in structure. No evidence of mitral valve regurgitation. No evidence of mitral valve stenosis. Tricuspid Valve: The tricuspid valve is normal in structure. Tricuspid valve regurgitation is not demonstrated. Aortic Valve: The aortic valve is tricuspid. There is mild calcification  of the aortic valve. Aortic valve regurgitation is not visualized. No aortic stenosis is present. Aortic valve mean gradient measures 5.0 mmHg. Aortic valve peak gradient measures 9.1 mmHg. Aortic valve area,  by VTI measures 1.81 cm. Pulmonic Valve: The pulmonic valve was normal in structure. Pulmonic valve regurgitation is not visualized. Aorta: The aortic root is normal in size and structure. Venous: The inferior vena cava was not well visualized. IAS/Shunts: No atrial level shunt detected by color flow Doppler.  LEFT VENTRICLE PLAX 2D LVIDd:         4.20 cm   Diastology LVIDs:         2.70 cm   LV e' medial:    5.51 cm/s LV PW:         1.20 cm   LV E/e' medial:  14.1 LV IVS:        0.90 cm   LV e' lateral:   6.57 cm/s LVOT diam:     1.70 cm   LV E/e' lateral: 11.9 LV SV:         47 LV SV Index:   23 LVOT Area:     2.27 cm  RIGHT VENTRICLE RV S prime:     14.50 cm/s LEFT ATRIUM             Index        RIGHT ATRIUM          Index LA diam:        2.70 cm 1.29 cm/m   RA Area:     9.72 cm LA Vol (A2C):   40.9 ml 19.54 ml/m  RA Volume:   21.60 ml 10.32 ml/m LA Vol (A4C):   31.3 ml 14.93 ml/m LA Biplane Vol: 42.8 ml 20.44 ml/m  AORTIC VALVE AV Area (Vmax):    2.04 cm AV Area (Vmean):   1.87 cm AV Area (VTI):     1.81 cm AV Vmax:           151.00 cm/s AV Vmean:          102.000 cm/s AV VTI:            0.261 m AV Peak Grad:      9.1 mmHg AV Mean Grad:      5.0 mmHg LVOT Vmax:         136.00 cm/s LVOT Vmean:        83.900 cm/s LVOT VTI:          0.208 m LVOT/AV VTI ratio: 0.80  AORTA Ao Root diam: 2.90 cm Ao Asc diam:  3.40 cm MITRAL VALVE MV Area (PHT): 5.54 cm    SHUNTS MV Decel Time: 137 msec    Systemic VTI:  0.21 m MV E velocity: 77.90 cm/s  Systemic Diam: 1.70 cm MV A velocity: 81.80 cm/s MV E/A ratio:  0.95 Dalton McleanMD Electronically signed by Wilfred Lacy Signature Date/Time: 06/15/2023/10:38:49 AM    Final    DG Chest 1 View Result Date: 06/13/2023 CLINICAL DATA:  Dyspnea EXAM: CHEST  1 VIEW COMPARISON:  Chest x-ray 06/13/2023 FINDINGS: There are patchy airspace opacities in the right mid and upper lung as well as in the left lung base. There is no pleural effusion or pneumothorax. The  cardiomediastinal silhouette is enlarged unchanged. Left-sided central venous catheter tip projects over the brachiocephalic SVC junction. No acute osseous abnormality. IMPRESSION: Patchy airspace opacities in the right mid and upper lung as well as in the left lung base, concerning for multifocal  pneumonia. Electronically Signed   By: Darliss Cheney M.D.   On: 06/13/2023 23:00   CT Head Wo Contrast Result Date: 06/13/2023 CLINICAL DATA:  Mental status change, unknown cause. Altered mental status. Septic. EXAM: CT HEAD WITHOUT CONTRAST TECHNIQUE: Contiguous axial images were obtained from the base of the skull through the vertex without intravenous contrast. RADIATION DOSE REDUCTION: This exam was performed according to the departmental dose-optimization program which includes automated exposure control, adjustment of the mA and/or kV according to patient size and/or use of iterative reconstruction technique. COMPARISON:  CT head without contrast 02/02/2022 FINDINGS: Brain: Mild atrophy and moderate white matter changes are stable. The ventricles are of normal size. Deep brain nuclei are within normal limits. No significant extraaxial fluid collection is present. The brainstem and cerebellum are within normal limits. Midline structures are within normal limits. Vascular: No hyperdense vessel or unexpected calcification. Skull: Calvarium is intact. No focal lytic or blastic lesions are present. No significant extracranial soft tissue lesion is present. Sinuses/Orbits: Extensive mucosal thickening and fluid is present in the right maxillary sinus. Mucosal thickening is present within right anterior ethmoid air cells. The paranasal sinuses and mastoid air cells are otherwise clear. Bilateral lens replacements are noted. Globes and orbits are otherwise unremarkable. A right lens replacement is present. Globes and orbits are otherwise within normal limits. IMPRESSION: 1. No acute intracranial abnormality or significant  interval change. 2. Stable atrophy and moderate white matter disease. This likely reflects the sequela of chronic microvascular ischemia. 3. Right maxillary and ethmoid sinus disease. Electronically Signed   By: Marin Roberts M.D.   On: 06/13/2023 19:21   DG Chest Port 1 View Result Date: 06/13/2023 CLINICAL DATA:  Drug overdose and lethargy EXAM: PORTABLE CHEST 1 VIEW COMPARISON:  Chest radiograph dated 02/02/2022 FINDINGS: Patient is rotated to the right. Mildly low lung volumes. Diffuse patchy left lower lung opacity. Minimal right basilar linear opacity. No focal consolidations. No pleural effusion or pneumothorax. Enlarged cardiomediastinal silhouette is likely projectional. No acute osseous abnormality. IMPRESSION: 1. Diffuse patchy left lower lung opacity, suspicious for aspiration. 2. Minimal right basilar linear opacity, likely atelectasis. Electronically Signed   By: Agustin Cree M.D.   On: 06/13/2023 18:55      Subjective: Seen and examined on day of DC.  Endorsing diarrhea, otherwise stable.  Appropriate for dispo to SNF  Discharge Exam: Vitals:   07/09/23 2341 07/10/23 0750  BP: (!) 159/87 130/73  Pulse: 94   Resp: 17 16  Temp: 97.9 F (36.6 C) 98.1 F (36.7 C)  SpO2: 98% 98%   Vitals:   07/08/23 1632 07/08/23 2030 07/09/23 2341 07/10/23 0750  BP: (!) 140/79 133/73 (!) 159/87 130/73  Pulse: 91 94 94   Resp: 15 18 17 16   Temp: 98.1 F (36.7 C) 98.2 F (36.8 C) 97.9 F (36.6 C) 98.1 F (36.7 C)  TempSrc: Oral  Oral Oral  SpO2: 98% 96% 98% 98%  Weight:      Height:        General: Pt is alert, awake, not in acute distress Cardiovascular: RRR, S1/S2 +, no rubs, no gallops Respiratory: CTA bilaterally, no wheezing, no rhonchi Abdominal: Soft, NT, ND, bowel sounds + Extremities: no edema, no cyanosis    The results of significant diagnostics from this hospitalization (including imaging, microbiology, ancillary and laboratory) are listed below for reference.      Microbiology: Recent Results (from the past 240 hours)  Blood Culture (routine x 2)  Status: None   Collection Time: 06/30/23  5:12 PM   Specimen: Right Antecubital; Blood  Result Value Ref Range Status   Specimen Description RIGHT ANTECUBITAL  Final   Special Requests   Final    BOTTLES DRAWN AEROBIC AND ANAEROBIC Blood Culture adequate volume   Culture   Final    NO GROWTH 5 DAYS Performed at [facility], [address] [place], [place] [address]    Report Status 07/05/2023 FINAL  Final  Blood Culture (routine x 2)     Status: None   Collection Time: 06/30/23  6:23 PM   Specimen: BLOOD LEFT ARM  Result Value Ref Range Status   Specimen Description BLOOD LEFT ARM  Final   Special Requests   Final    BOTTLES DRAWN AEROBIC AND ANAEROBIC Blood Culture results may not be optimal due to an inadequate volume of blood received in culture bottles   Culture   Final    NO GROWTH 5 DAYS Performed at [facility], [address] [place], [place] [address]    Report Status 07/05/2023 FINAL  Final  Gastrointestinal Panel by PCR , Stool     Status: None   Collection Time: 06/30/23  6:23 PM   Specimen: STOOL  Result Value Ref Range Status   Campylobacter species NOT DETECTED NOT DETECTED Final   Plesimonas shigelloides NOT DETECTED NOT DETECTED Final   Salmonella species NOT DETECTED NOT DETECTED Final   Yersinia enterocolitica NOT DETECTED NOT DETECTED Final   Vibrio species NOT DETECTED NOT DETECTED Final   Vibrio cholerae NOT DETECTED NOT DETECTED Final   Enteroaggregative E coli (EAEC) NOT DETECTED NOT DETECTED Final   Enteropathogenic E coli (EPEC) NOT DETECTED NOT DETECTED Final   Enterotoxigenic E coli (ETEC) NOT DETECTED NOT DETECTED Final   Shiga like toxin producing E coli (STEC) NOT DETECTED NOT DETECTED Final   Shigella/Enteroinvasive E coli (EIEC) NOT DETECTED NOT DETECTED Final   Cryptosporidium NOT DETECTED NOT DETECTED Final   Cyclospora  cayetanensis NOT DETECTED NOT DETECTED Final   Entamoeba histolytica NOT DETECTED NOT DETECTED Final   Giardia lamblia NOT DETECTED NOT DETECTED Final   Adenovirus F40/41 NOT DETECTED NOT DETECTED Final   Astrovirus NOT DETECTED NOT DETECTED Final   Norovirus GI/GII NOT DETECTED NOT DETECTED Final   Rotavirus A NOT DETECTED NOT DETECTED Final   Sapovirus (I, II, IV, and V) NOT DETECTED NOT DETECTED Final    Comment: Performed at [facility], [address] [place], [place] [address]  C Difficile Quick Screen w PCR reflex     Status: None   Collection Time: 06/30/23  6:25 PM   Specimen: STOOL  Result Value Ref Range Status   C Diff antigen NEGATIVE NEGATIVE Final   C Diff toxin NEGATIVE NEGATIVE Final   C Diff interpretation No C. difficile detected.  Final    Comment: Performed at [facility], [address] [place], [place] [address]  Resp panel by RT-PCR (RSV, Flu A&B, Covid) Anterior Nasal Swab     Status: None   Collection Time: 07/01/23  9:02 AM   Specimen: Anterior Nasal Swab  Result Value Ref Range Status   SARS Coronavirus 2 by RT PCR NEGATIVE NEGATIVE Final    Comment: (NOTE) SARS-CoV-2 target nucleic acids are NOT DETECTED.  The SARS-CoV-2 RNA is generally detectable in upper respiratory specimens during the acute phase of infection. The lowest concentration of SARS-CoV-2 viral copies this assay can detect is 138  copies/mL. A negative result does not preclude SARS-Cov-2 infection and should not be used as the sole basis for treatment or other patient management decisions. A negative result may occur with  improper specimen collection/handling, submission of specimen other than nasopharyngeal swab, presence of viral mutation(s) within the areas targeted by this assay, and inadequate number of viral copies(<138 copies/mL). A negative result must be combined with clinical observations, patient history, and epidemiological information. The expected  result is Negative.  Fact Sheet for Patients:  BloggerCourse.com  Fact Sheet for Healthcare Providers:  SeriousBroker.it  This test is no t yet approved or cleared by the [place] FDA and  has been authorized for detection and/or diagnosis of SARS-CoV-2 by FDA under an Emergency Use Authorization (EUA). This EUA will remain  in effect (meaning this test can be used) for the duration of the COVID-19 declaration under Section 564(b)(1) of the Act, 21 U.S.C.section 360bbb-3(b)(1), unless the authorization is terminated  or revoked sooner.       Influenza A by PCR NEGATIVE NEGATIVE Final   Influenza B by PCR NEGATIVE NEGATIVE Final    Comment: (NOTE) The Xpert Xpress SARS-CoV-2/FLU/RSV plus assay is intended as an aid in the diagnosis of influenza from Nasopharyngeal swab specimens and should not be used as a sole basis for treatment. Nasal washings and aspirates are unacceptable for Xpert Xpress SARS-CoV-2/FLU/RSV testing.  Fact Sheet for Patients: BloggerCourse.com  Fact Sheet for Healthcare Providers: SeriousBroker.it  This test is not yet approved or cleared by the [place] FDA and has been authorized for detection and/or diagnosis of SARS-CoV-2 by FDA under an Emergency Use Authorization (EUA). This EUA will remain in effect (meaning this test can be used) for the duration of the COVID-19 declaration under Section 564(b)(1) of the Act, 21 U.S.C. section 360bbb-3(b)(1), unless the authorization is terminated or revoked.     Resp Syncytial Virus by PCR NEGATIVE NEGATIVE Final    Comment: (NOTE) Fact Sheet for Patients: BloggerCourse.com  Fact Sheet for Healthcare Providers: SeriousBroker.it  This test is not yet approved or cleared by the [place] FDA and has been authorized for detection and/or diagnosis of  SARS-CoV-2 by FDA under an Emergency Use Authorization (EUA). This EUA will remain in effect (meaning this test can be used) for the duration of the COVID-19 declaration under Section 564(b)(1) of the Act, 21 U.S.C. section 360bbb-3(b)(1), unless the authorization is terminated or revoked.  Performed at [facility], [address] [place], [place] [address]      Labs: BNP (last 3 results) Recent Labs    06/20/23 0531 06/21/23 0843 07/01/23 0534  BNP 27.4 16.4 74.1   Basic Metabolic Panel: Recent Labs  Lab 07/04/23 0449 07/05/23 0434 07/07/23 0630  NA 139 140 140  K 3.7 3.5 3.7  CL 105 105 106  CO2 28 28 26   GLUCOSE 91 88 83  BUN 11 8 9   CREATININE 0.76 0.66 0.66  CALCIUM 8.0* 7.9* 8.0*   Liver Function Tests: No results for input(s): "AST", "ALT", "ALKPHOS", "BILITOT", "PROT", "ALBUMIN" in the last 168 hours. No results for input(s): "LIPASE", "AMYLASE" in the last 168 hours. No results for input(s): "AMMONIA" in the last 168 hours. CBC: No results for input(s): "WBC", "NEUTROABS", "HGB", "HCT", "MCV", "PLT" in the last 168 hours.  Cardiac Enzymes: No results for input(s): "CKTOTAL", "CKMB", "CKMBINDEX", "TROPONINI" in the last 168 hours. BNP: Invalid input(s): "POCBNP" CBG: No results for input(s): "GLUCAP" in the last 168 hours. D-Dimer No results  for input(s): "DDIMER" in the last 72 hours. Hgb A1c No results for input(s): "HGBA1C" in the last 72 hours. Lipid Profile No results for input(s): "CHOL", "HDL", "LDLCALC", "TRIG", "CHOLHDL", "LDLDIRECT" in the last 72 hours. Thyroid function studies No results for input(s): "TSH", "T4TOTAL", "T3FREE", "THYROIDAB" in the last 72 hours.  Invalid input(s): "FREET3" Anemia work up No results for input(s): "VITAMINB12", "FOLATE", "FERRITIN", "TIBC", "IRON", "RETICCTPCT" in the last 72 hours. Urinalysis    Component Value Date/Time   COLORURINE AMBER (A) 06/13/2023 1714   APPEARANCEUR CLOUDY (A)  06/13/2023 1714   LABSPEC 1.024 06/13/2023 1714   PHURINE 5.0 06/13/2023 1714   GLUCOSEU NEGATIVE 06/13/2023 1714   HGBUR NEGATIVE 06/13/2023 1714   BILIRUBINUR MODERATE (A) 06/13/2023 1714   BILIRUBINUR 1+ 01/30/2021 1450   KETONESUR NEGATIVE 06/13/2023 1714   PROTEINUR 100 (A) 06/13/2023 1714   UROBILINOGEN negative (A) 01/30/2021 1450   UROBILINOGEN 0.2 11/28/2019 1424   NITRITE NEGATIVE 06/13/2023 1714   LEUKOCYTESUR SMALL (A) 06/13/2023 1714   Sepsis Labs No results for input(s): "WBC" in the last 168 hours.  Invalid input(s): "PROCALCITONIN", "LACTICIDVEN"  Microbiology Recent Results (from the past 240 hours)  Blood Culture (routine x 2)     Status: None   Collection Time: 06/30/23  5:12 PM   Specimen: Right Antecubital; Blood  Result Value Ref Range Status   Specimen Description RIGHT ANTECUBITAL  Final   Special Requests   Final    BOTTLES DRAWN AEROBIC AND ANAEROBIC Blood Culture adequate volume   Culture   Final    NO GROWTH 5 DAYS Performed at [facility], [address] [place], [place] [address]    Report Status 07/05/2023 FINAL  Final  Blood Culture (routine x 2)     Status: None   Collection Time: 06/30/23  6:23 PM   Specimen: BLOOD LEFT ARM  Result Value Ref Range Status   Specimen Description BLOOD LEFT ARM  Final   Special Requests   Final    BOTTLES DRAWN AEROBIC AND ANAEROBIC Blood Culture results may not be optimal due to an inadequate volume of blood received in culture bottles   Culture   Final    NO GROWTH 5 DAYS Performed at [facility], [address] [place], [place] [address]    Report Status 07/05/2023 FINAL  Final  Gastrointestinal Panel by PCR , Stool     Status: None   Collection Time: 06/30/23  6:23 PM   Specimen: STOOL  Result Value Ref Range Status   Campylobacter species NOT DETECTED NOT DETECTED Final   Plesimonas shigelloides NOT DETECTED NOT DETECTED Final   Salmonella species NOT DETECTED NOT DETECTED  Final   Yersinia enterocolitica NOT DETECTED NOT DETECTED Final   Vibrio species NOT DETECTED NOT DETECTED Final   Vibrio cholerae NOT DETECTED NOT DETECTED Final   Enteroaggregative E coli (EAEC) NOT DETECTED NOT DETECTED Final   Enteropathogenic E coli (EPEC) NOT DETECTED NOT DETECTED Final   Enterotoxigenic E coli (ETEC) NOT DETECTED NOT DETECTED Final   Shiga like toxin producing E coli (STEC) NOT DETECTED NOT DETECTED Final   Shigella/Enteroinvasive E coli (EIEC) NOT DETECTED NOT DETECTED Final   Cryptosporidium NOT DETECTED NOT DETECTED Final   Cyclospora cayetanensis NOT DETECTED NOT DETECTED Final   Entamoeba histolytica NOT DETECTED NOT DETECTED Final   Giardia lamblia NOT DETECTED NOT DETECTED Final   Adenovirus F40/41 NOT DETECTED NOT DETECTED Final   Astrovirus NOT DETECTED NOT DETECTED Final  Norovirus GI/GII NOT DETECTED NOT DETECTED Final   Rotavirus A NOT DETECTED NOT DETECTED Final   Sapovirus (I, II, IV, and V) NOT DETECTED NOT DETECTED Final    Comment: Performed at [facility], [address] [place], [place] [address]  C Difficile Quick Screen w PCR reflex     Status: None   Collection Time: 06/30/23  6:25 PM   Specimen: STOOL  Result Value Ref Range Status   C Diff antigen NEGATIVE NEGATIVE Final   C Diff toxin NEGATIVE NEGATIVE Final   C Diff interpretation No C. difficile detected.  Final    Comment: Performed at [facility], [address] [place], [place] [address]  Resp panel by RT-PCR (RSV, Flu A&B, Covid) Anterior Nasal Swab     Status: None   Collection Time: 07/01/23  9:02 AM   Specimen: Anterior Nasal Swab  Result Value Ref Range Status   SARS Coronavirus 2 by RT PCR NEGATIVE NEGATIVE Final    Comment: (NOTE) SARS-CoV-2 target nucleic acids are NOT DETECTED.  The SARS-CoV-2 RNA is generally detectable in upper respiratory specimens during the acute phase of infection. The lowest concentration of SARS-CoV-2 viral copies this  assay can detect is 138 copies/mL. A negative result does not preclude SARS-Cov-2 infection and should not be used as the sole basis for treatment or other patient management decisions. A negative result may occur with  improper specimen collection/handling, submission of specimen other than nasopharyngeal swab, presence of viral mutation(s) within the areas targeted by this assay, and inadequate number of viral copies(<138 copies/mL). A negative result must be combined with clinical observations, patient history, and epidemiological information. The expected result is Negative.  Fact Sheet for Patients:  BloggerCourse.com  Fact Sheet for Healthcare Providers:  SeriousBroker.it  This test is no t yet approved or cleared by the [place] FDA and  has been authorized for detection and/or diagnosis of SARS-CoV-2 by FDA under an Emergency Use Authorization (EUA). This EUA will remain  in effect (meaning this test can be used) for the duration of the COVID-19 declaration under Section 564(b)(1) of the Act, 21 U.S.C.section 360bbb-3(b)(1), unless the authorization is terminated  or revoked sooner.       Influenza A by PCR NEGATIVE NEGATIVE Final   Influenza B by PCR NEGATIVE NEGATIVE Final    Comment: (NOTE) The Xpert Xpress SARS-CoV-2/FLU/RSV plus assay is intended as an aid in the diagnosis of influenza from Nasopharyngeal swab specimens and should not be used as a sole basis for treatment. Nasal washings and aspirates are unacceptable for Xpert Xpress SARS-CoV-2/FLU/RSV testing.  Fact Sheet for Patients: BloggerCourse.com  Fact Sheet for Healthcare Providers: SeriousBroker.it  This test is not yet approved or cleared by the [place] FDA and has been authorized for detection and/or diagnosis of SARS-CoV-2 by FDA under an Emergency Use Authorization (EUA). This EUA will  remain in effect (meaning this test can be used) for the duration of the COVID-19 declaration under Section 564(b)(1) of the Act, 21 U.S.C. section 360bbb-3(b)(1), unless the authorization is terminated or revoked.     Resp Syncytial Virus by PCR NEGATIVE NEGATIVE Final    Comment: (NOTE) Fact Sheet for Patients: BloggerCourse.com  Fact Sheet for Healthcare Providers: SeriousBroker.it  This test is not yet approved or cleared by the [place] FDA and has been authorized for detection and/or diagnosis of SARS-CoV-2 by FDA under an Emergency Use Authorization (EUA). This EUA will remain in effect (meaning this test can  be used) for the duration of the COVID-19 declaration under Section 564(b)(1) of the Act, 21 U.S.C. section 360bbb-3(b)(1), unless the authorization is terminated or revoked.  Performed at [facility], [address], [place], [place] [address]      Time coordinating discharge: Over 30 minutes  SIGNED:   Tresa Moore, MD  [facility] 07/10/2023, 9:18 AM Pager   If 7PM-7AM, please contact night-coverage

## 2023-07-09 NOTE — Progress Notes (Signed)
Mobility Specialist - Progress Note   07/09/23 1500  Mobility  Activity Stood at bedside;Ambulated with assistance in room  Level of Assistance Standby assist, set-up cues, supervision of patient - no hands on  Assistive Device Front wheel walker  Distance Ambulated (ft) 4 ft  Activity Response Tolerated well  Mobility visit 1 Mobility     Pt lying in bed upon arrival, utilizing RA. Pt agreeable to activity with min encouragement. Voiced pain in back and abdomen. Completed bed mobility with modA. Dangled EOB ~ 10 minutes for bathing tasks. STS x3 with rest breaks taken in between bouts. Forward flexed posture. Pt took several lateral steps towards HOB before returning supine. Pt left in bed with alarm set, needs in reach.    [name] Mobility Specialist 07/09/23, 3:52 PM

## 2023-07-10 DIAGNOSIS — K56699 Other intestinal obstruction unspecified as to partial versus complete obstruction: Secondary | ICD-10-CM | POA: Diagnosis not present

## 2023-07-10 DIAGNOSIS — F419 Anxiety disorder, unspecified: Secondary | ICD-10-CM | POA: Diagnosis not present

## 2023-07-10 DIAGNOSIS — K529 Noninfective gastroenteritis and colitis, unspecified: Secondary | ICD-10-CM | POA: Diagnosis not present

## 2023-07-10 DIAGNOSIS — I2510 Atherosclerotic heart disease of native coronary artery without angina pectoris: Secondary | ICD-10-CM | POA: Diagnosis not present

## 2023-07-10 DIAGNOSIS — G8929 Other chronic pain: Secondary | ICD-10-CM | POA: Diagnosis not present

## 2023-07-10 DIAGNOSIS — I5032 Chronic diastolic (congestive) heart failure: Secondary | ICD-10-CM | POA: Diagnosis not present

## 2023-07-10 DIAGNOSIS — Z741 Need for assistance with personal care: Secondary | ICD-10-CM | POA: Diagnosis not present

## 2023-07-10 DIAGNOSIS — E039 Hypothyroidism, unspecified: Secondary | ICD-10-CM | POA: Diagnosis not present

## 2023-07-10 DIAGNOSIS — I110 Hypertensive heart disease with heart failure: Secondary | ICD-10-CM | POA: Diagnosis present

## 2023-07-10 DIAGNOSIS — M6281 Muscle weakness (generalized): Secondary | ICD-10-CM | POA: Diagnosis not present

## 2023-07-10 DIAGNOSIS — E2740 Unspecified adrenocortical insufficiency: Secondary | ICD-10-CM | POA: Diagnosis present

## 2023-07-10 DIAGNOSIS — D849 Immunodeficiency, unspecified: Secondary | ICD-10-CM | POA: Diagnosis present

## 2023-07-10 DIAGNOSIS — E785 Hyperlipidemia, unspecified: Secondary | ICD-10-CM | POA: Diagnosis not present

## 2023-07-10 DIAGNOSIS — K567 Ileus, unspecified: Secondary | ICD-10-CM | POA: Diagnosis not present

## 2023-07-10 DIAGNOSIS — I824Y2 Acute embolism and thrombosis of unspecified deep veins of left proximal lower extremity: Secondary | ICD-10-CM | POA: Diagnosis not present

## 2023-07-10 DIAGNOSIS — M1A09X0 Idiopathic chronic gout, multiple sites, without tophus (tophi): Secondary | ICD-10-CM | POA: Diagnosis not present

## 2023-07-10 DIAGNOSIS — G3184 Mild cognitive impairment, so stated: Secondary | ICD-10-CM | POA: Diagnosis present

## 2023-07-10 DIAGNOSIS — R2990 Unspecified symptoms and signs involving the nervous system: Secondary | ICD-10-CM | POA: Diagnosis not present

## 2023-07-10 DIAGNOSIS — J479 Bronchiectasis, uncomplicated: Secondary | ICD-10-CM | POA: Diagnosis not present

## 2023-07-10 DIAGNOSIS — R1032 Left lower quadrant pain: Secondary | ICD-10-CM | POA: Diagnosis not present

## 2023-07-10 DIAGNOSIS — R918 Other nonspecific abnormal finding of lung field: Secondary | ICD-10-CM | POA: Diagnosis not present

## 2023-07-10 DIAGNOSIS — R6520 Severe sepsis without septic shock: Secondary | ICD-10-CM | POA: Diagnosis not present

## 2023-07-10 DIAGNOSIS — M0510 Rheumatoid lung disease with rheumatoid arthritis of unspecified site: Secondary | ICD-10-CM | POA: Diagnosis present

## 2023-07-10 DIAGNOSIS — M170 Bilateral primary osteoarthritis of knee: Secondary | ICD-10-CM | POA: Diagnosis not present

## 2023-07-10 DIAGNOSIS — N281 Cyst of kidney, acquired: Secondary | ICD-10-CM | POA: Diagnosis not present

## 2023-07-10 DIAGNOSIS — R4182 Altered mental status, unspecified: Secondary | ICD-10-CM | POA: Diagnosis not present

## 2023-07-10 DIAGNOSIS — M069 Rheumatoid arthritis, unspecified: Secondary | ICD-10-CM | POA: Diagnosis not present

## 2023-07-10 DIAGNOSIS — I959 Hypotension, unspecified: Secondary | ICD-10-CM | POA: Diagnosis not present

## 2023-07-10 DIAGNOSIS — D500 Iron deficiency anemia secondary to blood loss (chronic): Secondary | ICD-10-CM | POA: Diagnosis present

## 2023-07-10 DIAGNOSIS — R278 Other lack of coordination: Secondary | ICD-10-CM | POA: Diagnosis not present

## 2023-07-10 DIAGNOSIS — J8410 Pulmonary fibrosis, unspecified: Secondary | ICD-10-CM | POA: Diagnosis present

## 2023-07-10 DIAGNOSIS — I471 Supraventricular tachycardia, unspecified: Secondary | ICD-10-CM | POA: Diagnosis present

## 2023-07-10 DIAGNOSIS — Z743 Need for continuous supervision: Secondary | ICD-10-CM | POA: Diagnosis not present

## 2023-07-10 DIAGNOSIS — F32A Depression, unspecified: Secondary | ICD-10-CM | POA: Diagnosis present

## 2023-07-10 DIAGNOSIS — I509 Heart failure, unspecified: Secondary | ICD-10-CM | POA: Diagnosis not present

## 2023-07-10 DIAGNOSIS — J9611 Chronic respiratory failure with hypoxia: Secondary | ICD-10-CM | POA: Diagnosis not present

## 2023-07-10 DIAGNOSIS — A419 Sepsis, unspecified organism: Secondary | ICD-10-CM | POA: Diagnosis not present

## 2023-07-10 DIAGNOSIS — Z9981 Dependence on supplemental oxygen: Secondary | ICD-10-CM | POA: Diagnosis not present

## 2023-07-10 DIAGNOSIS — Z6835 Body mass index (BMI) 35.0-35.9, adult: Secondary | ICD-10-CM | POA: Diagnosis not present

## 2023-07-10 DIAGNOSIS — R0902 Hypoxemia: Secondary | ICD-10-CM | POA: Diagnosis not present

## 2023-07-10 DIAGNOSIS — D649 Anemia, unspecified: Secondary | ICD-10-CM | POA: Diagnosis not present

## 2023-07-10 DIAGNOSIS — M6259 Muscle wasting and atrophy, not elsewhere classified, multiple sites: Secondary | ICD-10-CM | POA: Diagnosis not present

## 2023-07-10 DIAGNOSIS — E7800 Pure hypercholesterolemia, unspecified: Secondary | ICD-10-CM | POA: Diagnosis present

## 2023-07-10 DIAGNOSIS — K921 Melena: Secondary | ICD-10-CM | POA: Diagnosis not present

## 2023-07-10 DIAGNOSIS — R5381 Other malaise: Secondary | ICD-10-CM | POA: Diagnosis not present

## 2023-07-10 DIAGNOSIS — Z1152 Encounter for screening for COVID-19: Secondary | ICD-10-CM | POA: Diagnosis not present

## 2023-07-10 DIAGNOSIS — I739 Peripheral vascular disease, unspecified: Secondary | ICD-10-CM | POA: Diagnosis not present

## 2023-07-10 DIAGNOSIS — G629 Polyneuropathy, unspecified: Secondary | ICD-10-CM | POA: Diagnosis not present

## 2023-07-10 DIAGNOSIS — E872 Acidosis, unspecified: Secondary | ICD-10-CM | POA: Diagnosis present

## 2023-07-10 DIAGNOSIS — G894 Chronic pain syndrome: Secondary | ICD-10-CM | POA: Diagnosis not present

## 2023-07-10 DIAGNOSIS — K922 Gastrointestinal hemorrhage, unspecified: Secondary | ICD-10-CM | POA: Diagnosis not present

## 2023-07-10 DIAGNOSIS — M545 Low back pain, unspecified: Secondary | ICD-10-CM | POA: Diagnosis not present

## 2023-07-10 DIAGNOSIS — E1169 Type 2 diabetes mellitus with other specified complication: Secondary | ICD-10-CM | POA: Diagnosis not present

## 2023-07-10 DIAGNOSIS — E1142 Type 2 diabetes mellitus with diabetic polyneuropathy: Secondary | ICD-10-CM | POA: Diagnosis present

## 2023-07-10 DIAGNOSIS — H2513 Age-related nuclear cataract, bilateral: Secondary | ICD-10-CM | POA: Diagnosis not present

## 2023-07-10 DIAGNOSIS — R000 Tachycardia, unspecified: Secondary | ICD-10-CM | POA: Diagnosis not present

## 2023-07-10 DIAGNOSIS — R197 Diarrhea, unspecified: Secondary | ICD-10-CM | POA: Diagnosis not present

## 2023-07-10 DIAGNOSIS — M109 Gout, unspecified: Secondary | ICD-10-CM | POA: Diagnosis not present

## 2023-07-10 DIAGNOSIS — R100 Acute abdomen: Secondary | ICD-10-CM | POA: Diagnosis not present

## 2023-07-10 DIAGNOSIS — R279 Unspecified lack of coordination: Secondary | ICD-10-CM | POA: Diagnosis not present

## 2023-07-10 DIAGNOSIS — R6521 Severe sepsis with septic shock: Secondary | ICD-10-CM | POA: Diagnosis not present

## 2023-07-10 DIAGNOSIS — I6782 Cerebral ischemia: Secondary | ICD-10-CM | POA: Diagnosis not present

## 2023-07-10 DIAGNOSIS — I10 Essential (primary) hypertension: Secondary | ICD-10-CM | POA: Diagnosis not present

## 2023-07-10 DIAGNOSIS — E876 Hypokalemia: Secondary | ICD-10-CM | POA: Diagnosis present

## 2023-07-10 DIAGNOSIS — N183 Chronic kidney disease, stage 3 unspecified: Secondary | ICD-10-CM | POA: Diagnosis not present

## 2023-07-10 DIAGNOSIS — K56609 Unspecified intestinal obstruction, unspecified as to partial versus complete obstruction: Secondary | ICD-10-CM | POA: Diagnosis not present

## 2023-07-10 DIAGNOSIS — J168 Pneumonia due to other specified infectious organisms: Secondary | ICD-10-CM | POA: Diagnosis not present

## 2023-07-10 DIAGNOSIS — Z90710 Acquired absence of both cervix and uterus: Secondary | ICD-10-CM | POA: Diagnosis not present

## 2023-07-10 DIAGNOSIS — R531 Weakness: Secondary | ICD-10-CM | POA: Diagnosis not present

## 2023-07-10 DIAGNOSIS — J4489 Other specified chronic obstructive pulmonary disease: Secondary | ICD-10-CM | POA: Diagnosis present

## 2023-07-10 DIAGNOSIS — H04129 Dry eye syndrome of unspecified lacrimal gland: Secondary | ICD-10-CM | POA: Diagnosis not present

## 2023-07-10 DIAGNOSIS — K219 Gastro-esophageal reflux disease without esophagitis: Secondary | ICD-10-CM | POA: Diagnosis not present

## 2023-07-10 DIAGNOSIS — R109 Unspecified abdominal pain: Secondary | ICD-10-CM | POA: Diagnosis not present

## 2023-07-10 DIAGNOSIS — N179 Acute kidney failure, unspecified: Secondary | ICD-10-CM | POA: Diagnosis present

## 2023-07-10 DIAGNOSIS — R55 Syncope and collapse: Secondary | ICD-10-CM | POA: Diagnosis not present

## 2023-07-10 MED ORDER — DICYCLOMINE HCL 20 MG PO TABS
20.0000 mg | ORAL_TABLET | Freq: Three times a day (TID) | ORAL | Status: DC
  Filled 2023-07-10 (×2): qty 1

## 2023-07-10 MED ORDER — DICYCLOMINE HCL 20 MG PO TABS: 20.0000 mg | ORAL_TABLET | Freq: Three times a day (TID) | ORAL | Status: DC

## 2023-07-10 NOTE — Plan of Care (Signed)
   Problem: Fluid Volume: Goal: Hemodynamic stability will improve Outcome: Progressing   Problem: Clinical Measurements: Goal: Diagnostic test results will improve Outcome: Progressing Goal: Signs and symptoms of infection will decrease Outcome: Progressing   Problem: Respiratory: Goal: Ability to maintain adequate ventilation will improve Outcome: Progressing   Problem: Education: Goal: Knowledge of General Education information will improve Description: Including pain rating scale, medication(s)/side effects and non-pharmacologic comfort measures Outcome: Progressing   Problem: Health Behavior/Discharge Planning: Goal: Ability to manage health-related needs will improve Outcome: Progressing   Problem: Clinical Measurements: Goal: Ability to maintain clinical measurements within normal limits will improve Outcome: Progressing Goal: Will remain free from infection Outcome: Progressing Goal: Diagnostic test results will improve Outcome: Progressing Goal: Respiratory complications will improve Outcome: Progressing Goal: Cardiovascular complication will be avoided Outcome: Progressing   Problem: Activity: Goal: Risk for activity intolerance will decrease Outcome: Progressing   Problem: Nutrition: Goal: Adequate nutrition will be maintained Outcome: Progressing   Problem: Coping: Goal: Level of anxiety will decrease Outcome: Progressing   Problem: Elimination: Goal: Will not experience complications related to bowel motility Outcome: Progressing Goal: Will not experience complications related to urinary retention Outcome: Progressing   Problem: Pain Managment: Goal: General experience of comfort will improve and/or be controlled Outcome: Progressing   Problem: Safety: Goal: Ability to remain free from injury will improve Outcome: Progressing   Problem: Skin Integrity: Goal: Risk for impaired skin integrity will decrease Outcome: Progressing

## 2023-07-10 NOTE — Plan of Care (Signed)
  Problem: Fluid Volume: Goal: Hemodynamic stability will improve 07/10/2023 1233 by Emilio Aspen, RN Outcome: Adequate for Discharge 07/10/2023 1232 by Emilio Aspen, RN Outcome: Progressing   Problem: Clinical Measurements: Goal: Diagnostic test results will improve 07/10/2023 1233 by Emilio Aspen, RN Outcome: Adequate for Discharge 07/10/2023 1232 by Emilio Aspen, RN Outcome: Progressing Goal: Signs and symptoms of infection will decrease 07/10/2023 1233 by Emilio Aspen, RN Outcome: Adequate for Discharge 07/10/2023 1232 by Emilio Aspen, RN Outcome: Progressing   Problem: Respiratory: Goal: Ability to maintain adequate ventilation will improve 07/10/2023 1233 by Emilio Aspen, RN Outcome: Adequate for Discharge 07/10/2023 1232 by Emilio Aspen, RN Outcome: Progressing   Problem: Education: Goal: Knowledge of General Education information will improve Description: Including pain rating scale, medication(s)/side effects and non-pharmacologic comfort measures 07/10/2023 1233 by Emilio Aspen, RN Outcome: Adequate for Discharge 07/10/2023 1232 by Emilio Aspen, RN Outcome: Progressing   Problem: Health Behavior/Discharge Planning: Goal: Ability to manage health-related needs will improve 07/10/2023 1233 by Emilio Aspen, RN Outcome: Adequate for Discharge 07/10/2023 1232 by Emilio Aspen, RN Outcome: Progressing   Problem: Clinical Measurements: Goal: Ability to maintain clinical measurements within normal limits will improve 07/10/2023 1233 by Emilio Aspen, RN Outcome: Adequate for Discharge 07/10/2023 1232 by Emilio Aspen, RN Outcome: Progressing Goal: Will remain free from infection 07/10/2023 1233 by Emilio Aspen, RN Outcome: Adequate for Discharge 07/10/2023 1232 by Emilio Aspen, RN Outcome: Progressing Goal: Diagnostic test results will improve 07/10/2023 1233 by Emilio Aspen, RN Outcome: Adequate for  Discharge 07/10/2023 1232 by Emilio Aspen, RN Outcome: Progressing Goal: Respiratory complications will improve 07/10/2023 1233 by Emilio Aspen, RN Outcome: Adequate for Discharge 07/10/2023 1232 by Emilio Aspen, RN Outcome: Progressing Goal: Cardiovascular complication will be avoided 07/10/2023 1233 by Emilio Aspen, RN Outcome: Adequate for Discharge 07/10/2023 1232 by Emilio Aspen, RN Outcome: Progressing   Problem: Activity: Goal: Risk for activity intolerance will decrease 07/10/2023 1233 by Emilio Aspen, RN Outcome: Adequate for Discharge 07/10/2023 1232 by Emilio Aspen, RN Outcome: Progressing   Problem: Nutrition: Goal: Adequate nutrition will be maintained 07/10/2023 1233 by Emilio Aspen, RN Outcome: Adequate for Discharge 07/10/2023 1232 by Emilio Aspen, RN Outcome: Progressing   Problem: Coping: Goal: Level of anxiety will decrease 07/10/2023 1233 by Emilio Aspen, RN Outcome: Adequate for Discharge 07/10/2023 1232 by Emilio Aspen, RN Outcome: Progressing   Problem: Elimination: Goal: Will not experience complications related to bowel motility 07/10/2023 1233 by Emilio Aspen, RN Outcome: Adequate for Discharge 07/10/2023 1232 by Emilio Aspen, RN Outcome: Progressing Goal: Will not experience complications related to urinary retention 07/10/2023 1233 by Emilio Aspen, RN Outcome: Adequate for Discharge 07/10/2023 1232 by Emilio Aspen, RN Outcome: Progressing   Problem: Pain Managment: Goal: General experience of comfort will improve and/or be controlled 07/10/2023 1233 by Emilio Aspen, RN Outcome: Adequate for Discharge 07/10/2023 1232 by Emilio Aspen, RN Outcome: Progressing   Problem: Safety: Goal: Ability to remain free from injury will improve 07/10/2023 1233 by Emilio Aspen, RN Outcome: Adequate for Discharge 07/10/2023 1232 by Emilio Aspen, RN Outcome: Progressing   Problem: Skin  Integrity: Goal: Risk for impaired skin integrity will decrease 07/10/2023 1233 by Emilio Aspen, RN Outcome: Adequate for Discharge 07/10/2023 1232 by Emilio Aspen, RN Outcome: Progressing

## 2023-07-10 NOTE — Consult Note (Signed)
[facility] Liaison Consult Note    07/10/2023  [name] 23-Nov-1940 [id number]  UPDATE:  Pt discharged to [facility] today for the recommended SNF level of care.  Hospital liaison will collaborate with PAC-RN with [facility] to follow up accordingly for any post discharged needs for care management services.   [facility], Population Health does not replace or interfere with any arrangements made by the Inpatient Transition of Care team.  For questions contact:  Elliot Cousin, RN, BSN Hospital Liaison [place]   [facility], Population Health Office Hours MTWF  8:00 am-6:00 pm Direct Dial: [phone] mobile or secure chat [email or web address]

## 2023-07-10 NOTE — Plan of Care (Signed)
  Problem: Respiratory: Goal: Ability to maintain adequate ventilation will improve Outcome: Progressing   Problem: Education: Goal: Knowledge of General Education information will improve Description: Including pain rating scale, medication(s)/side effects and non-pharmacologic comfort measures Outcome: Progressing   Problem: Health Behavior/Discharge Planning: Goal: Ability to manage health-related needs will improve Outcome: Progressing   Problem: Coping: Goal: Level of anxiety will decrease Outcome: Progressing   Problem: Elimination: Goal: Will not experience complications related to bowel motility Outcome: Progressing Goal: Will not experience complications related to urinary retention Outcome: Progressing   Problem: Pain Managment: Goal: General experience of comfort will improve and/or be controlled Outcome: Progressing   Problem: Skin Integrity: Goal: Risk for impaired skin integrity will decrease Outcome: Progressing   Problem: Safety: Goal: Ability to remain free from injury will improve Outcome: Progressing

## 2023-07-10 NOTE — TOC Progression Note (Signed)
Transition of Care [facility]) - Progression Note    Patient Details  Name: [name] MRN: [id number] Date of Birth: 08/07/1940  [facility]) CM/SW Contact  [name], RN Phone Number: 07/10/2023, 10:45 AM  Clinical Narrative:     [name] with Lyda Jester the son and notified him that she will dc to [facility] today room 36 EMS called to transport 1st on list  Expected Discharge Plan: Skilled Nursing Facility Barriers to Discharge: SNF Pending bed offer  Expected Discharge Plan and Services   Discharge Planning Services: CM Consult   Living arrangements for the past 2 months: Single Family Home Expected Discharge Date: 07/09/23                                     Social Determinants of Health (SDOH) Interventions SDOH Screenings   Food Insecurity: No Food Insecurity (07/01/2023)  Housing: Low Risk  (07/01/2023)  Transportation Needs: No Transportation Needs (07/01/2023)  Utilities: Not At Risk (07/01/2023)  Alcohol Screen: Low Risk  (10/27/2022)  Depression (PHQ2-9): High Risk (06/12/2023)  Financial Resource Strain: Low Risk  (06/11/2023)  Physical Activity: Inactive (06/11/2023)  Social Connections: Socially Integrated (07/01/2023)  Stress: No Stress Concern Present (10/27/2022)  Tobacco Use: Low Risk  (07/01/2023)    Readmission Risk Interventions    07/08/2023   10:09 AM 12/16/2022   11:09 AM  Readmission Risk Prevention Plan  Transportation Screening Complete Complete  PCP or Specialist Appt within 5-7 Days  Complete  Home Care Screening  Complete  Medication Review (RN CM)  Complete  Medication Review (RN Care Manager) Referral to Pharmacy   PCP or Specialist appointment within 3-5 days of discharge Complete   HRI or Home Care Consult Complete   Palliative Care Screening Not Applicable   Skilled Nursing Facility Complete

## 2023-07-13 DIAGNOSIS — R5381 Other malaise: Secondary | ICD-10-CM | POA: Diagnosis not present

## 2023-07-13 DIAGNOSIS — K529 Noninfective gastroenteritis and colitis, unspecified: Secondary | ICD-10-CM | POA: Diagnosis not present

## 2023-07-13 DIAGNOSIS — I509 Heart failure, unspecified: Secondary | ICD-10-CM | POA: Diagnosis not present

## 2023-07-13 DIAGNOSIS — R100 Acute abdomen: Secondary | ICD-10-CM | POA: Diagnosis not present

## 2023-07-15 ENCOUNTER — Encounter (HOSPITAL_COMMUNITY): Payer: Self-pay | Admitting: Internal Medicine

## 2023-07-15 ENCOUNTER — Emergency Department (HOSPITAL_COMMUNITY): Payer: Medicare Other

## 2023-07-15 ENCOUNTER — Inpatient Hospital Stay (HOSPITAL_COMMUNITY)
Admission: EM | Admit: 2023-07-15 | Discharge: 2023-07-20 | DRG: 330 | Disposition: A | Discharge status: Rehabilitation Facility | Payer: Medicare Other | Source: Skilled Nursing Facility | Attending: Internal Medicine | Admitting: Internal Medicine

## 2023-07-15 DIAGNOSIS — E7800 Pure hypercholesterolemia, unspecified: Secondary | ICD-10-CM | POA: Diagnosis present

## 2023-07-15 DIAGNOSIS — K567 Ileus, unspecified: Secondary | ICD-10-CM | POA: Diagnosis not present

## 2023-07-15 DIAGNOSIS — Z8049 Family history of malignant neoplasm of other genital organs: Secondary | ICD-10-CM

## 2023-07-15 DIAGNOSIS — Z8051 Family history of malignant neoplasm of kidney: Secondary | ICD-10-CM

## 2023-07-15 DIAGNOSIS — Z801 Family history of malignant neoplasm of trachea, bronchus and lung: Secondary | ICD-10-CM

## 2023-07-15 DIAGNOSIS — Z79890 Hormone replacement therapy (postmenopausal): Secondary | ICD-10-CM

## 2023-07-15 DIAGNOSIS — I110 Hypertensive heart disease with heart failure: Secondary | ICD-10-CM | POA: Diagnosis present

## 2023-07-15 DIAGNOSIS — D500 Iron deficiency anemia secondary to blood loss (chronic): Secondary | ICD-10-CM | POA: Diagnosis present

## 2023-07-15 DIAGNOSIS — J189 Pneumonia, unspecified organism: Secondary | ICD-10-CM

## 2023-07-15 DIAGNOSIS — J8410 Pulmonary fibrosis, unspecified: Secondary | ICD-10-CM | POA: Diagnosis present

## 2023-07-15 DIAGNOSIS — F32A Depression, unspecified: Secondary | ICD-10-CM | POA: Diagnosis present

## 2023-07-15 DIAGNOSIS — R278 Other lack of coordination: Secondary | ICD-10-CM | POA: Diagnosis not present

## 2023-07-15 DIAGNOSIS — R2990 Unspecified symptoms and signs involving the nervous system: Secondary | ICD-10-CM | POA: Diagnosis not present

## 2023-07-15 DIAGNOSIS — Z79891 Long term (current) use of opiate analgesic: Secondary | ICD-10-CM

## 2023-07-15 DIAGNOSIS — R109 Unspecified abdominal pain: Secondary | ICD-10-CM | POA: Diagnosis present

## 2023-07-15 DIAGNOSIS — K921 Melena: Secondary | ICD-10-CM | POA: Diagnosis not present

## 2023-07-15 DIAGNOSIS — Z888 Allergy status to other drugs, medicaments and biological substances status: Secondary | ICD-10-CM

## 2023-07-15 DIAGNOSIS — K922 Gastrointestinal hemorrhage, unspecified: Secondary | ICD-10-CM | POA: Diagnosis present

## 2023-07-15 DIAGNOSIS — D849 Immunodeficiency, unspecified: Secondary | ICD-10-CM | POA: Diagnosis present

## 2023-07-15 DIAGNOSIS — N281 Cyst of kidney, acquired: Secondary | ICD-10-CM | POA: Diagnosis not present

## 2023-07-15 DIAGNOSIS — I6782 Cerebral ischemia: Secondary | ICD-10-CM | POA: Diagnosis not present

## 2023-07-15 DIAGNOSIS — K5669 Other partial intestinal obstruction: Secondary | ICD-10-CM | POA: Diagnosis not present

## 2023-07-15 DIAGNOSIS — M05742 Rheumatoid arthritis with rheumatoid factor of left hand without organ or systems involvement: Secondary | ICD-10-CM

## 2023-07-15 DIAGNOSIS — R262 Difficulty in walking, not elsewhere classified: Secondary | ICD-10-CM | POA: Diagnosis present

## 2023-07-15 DIAGNOSIS — Z794 Long term (current) use of insulin: Secondary | ICD-10-CM | POA: Diagnosis not present

## 2023-07-15 DIAGNOSIS — Z9981 Dependence on supplemental oxygen: Secondary | ICD-10-CM | POA: Diagnosis not present

## 2023-07-15 DIAGNOSIS — J9610 Chronic respiratory failure, unspecified whether with hypoxia or hypercapnia: Secondary | ICD-10-CM | POA: Diagnosis present

## 2023-07-15 DIAGNOSIS — K56609 Unspecified intestinal obstruction, unspecified as to partial versus complete obstruction: Secondary | ICD-10-CM | POA: Diagnosis not present

## 2023-07-15 DIAGNOSIS — E785 Hyperlipidemia, unspecified: Secondary | ICD-10-CM | POA: Diagnosis present

## 2023-07-15 DIAGNOSIS — R5381 Other malaise: Secondary | ICD-10-CM | POA: Diagnosis not present

## 2023-07-15 DIAGNOSIS — K5730 Diverticulosis of large intestine without perforation or abscess without bleeding: Secondary | ICD-10-CM | POA: Diagnosis present

## 2023-07-15 DIAGNOSIS — I444 Left anterior fascicular block: Secondary | ICD-10-CM | POA: Diagnosis present

## 2023-07-15 DIAGNOSIS — A419 Sepsis, unspecified organism: Secondary | ICD-10-CM | POA: Diagnosis not present

## 2023-07-15 DIAGNOSIS — I10 Essential (primary) hypertension: Secondary | ICD-10-CM | POA: Diagnosis not present

## 2023-07-15 DIAGNOSIS — M6259 Muscle wasting and atrophy, not elsewhere classified, multiple sites: Secondary | ICD-10-CM | POA: Diagnosis not present

## 2023-07-15 DIAGNOSIS — M545 Low back pain, unspecified: Secondary | ICD-10-CM | POA: Diagnosis present

## 2023-07-15 DIAGNOSIS — J4489 Other specified chronic obstructive pulmonary disease: Secondary | ICD-10-CM | POA: Diagnosis not present

## 2023-07-15 DIAGNOSIS — E1142 Type 2 diabetes mellitus with diabetic polyneuropathy: Secondary | ICD-10-CM | POA: Diagnosis not present

## 2023-07-15 DIAGNOSIS — F0394 Unspecified dementia, unspecified severity, with anxiety: Secondary | ICD-10-CM | POA: Diagnosis not present

## 2023-07-15 DIAGNOSIS — N183 Chronic kidney disease, stage 3 unspecified: Secondary | ICD-10-CM | POA: Diagnosis not present

## 2023-07-15 DIAGNOSIS — Z800 Family history of malignant neoplasm of digestive organs: Secondary | ICD-10-CM

## 2023-07-15 DIAGNOSIS — J479 Bronchiectasis, uncomplicated: Secondary | ICD-10-CM | POA: Diagnosis not present

## 2023-07-15 DIAGNOSIS — Z841 Family history of disorders of kidney and ureter: Secondary | ICD-10-CM

## 2023-07-15 DIAGNOSIS — K56699 Other intestinal obstruction unspecified as to partial versus complete obstruction: Secondary | ICD-10-CM | POA: Diagnosis not present

## 2023-07-15 DIAGNOSIS — Z79899 Other long term (current) drug therapy: Secondary | ICD-10-CM

## 2023-07-15 DIAGNOSIS — I471 Supraventricular tachycardia, unspecified: Secondary | ICD-10-CM | POA: Diagnosis present

## 2023-07-15 DIAGNOSIS — I5032 Chronic diastolic (congestive) heart failure: Secondary | ICD-10-CM | POA: Diagnosis present

## 2023-07-15 DIAGNOSIS — Z86711 Personal history of pulmonary embolism: Secondary | ICD-10-CM | POA: Diagnosis present

## 2023-07-15 DIAGNOSIS — M1A09X0 Idiopathic chronic gout, multiple sites, without tophus (tophi): Secondary | ICD-10-CM

## 2023-07-15 DIAGNOSIS — W19XXXA Unspecified fall, initial encounter: Secondary | ICD-10-CM | POA: Diagnosis present

## 2023-07-15 DIAGNOSIS — K6389 Other specified diseases of intestine: Secondary | ICD-10-CM | POA: Diagnosis not present

## 2023-07-15 DIAGNOSIS — E1140 Type 2 diabetes mellitus with diabetic neuropathy, unspecified: Secondary | ICD-10-CM | POA: Diagnosis present

## 2023-07-15 DIAGNOSIS — E039 Hypothyroidism, unspecified: Secondary | ICD-10-CM | POA: Diagnosis not present

## 2023-07-15 DIAGNOSIS — R197 Diarrhea, unspecified: Secondary | ICD-10-CM | POA: Diagnosis not present

## 2023-07-15 DIAGNOSIS — J849 Interstitial pulmonary disease, unspecified: Secondary | ICD-10-CM | POA: Diagnosis not present

## 2023-07-15 DIAGNOSIS — G8929 Other chronic pain: Secondary | ICD-10-CM | POA: Diagnosis present

## 2023-07-15 DIAGNOSIS — Z803 Family history of malignant neoplasm of breast: Secondary | ICD-10-CM

## 2023-07-15 DIAGNOSIS — I824Y2 Acute embolism and thrombosis of unspecified deep veins of left proximal lower extremity: Secondary | ICD-10-CM

## 2023-07-15 DIAGNOSIS — K529 Noninfective gastroenteritis and colitis, unspecified: Secondary | ICD-10-CM

## 2023-07-15 DIAGNOSIS — I130 Hypertensive heart and chronic kidney disease with heart failure and stage 1 through stage 4 chronic kidney disease, or unspecified chronic kidney disease: Secondary | ICD-10-CM | POA: Diagnosis not present

## 2023-07-15 DIAGNOSIS — E876 Hypokalemia: Secondary | ICD-10-CM | POA: Diagnosis present

## 2023-07-15 DIAGNOSIS — E872 Acidosis, unspecified: Secondary | ICD-10-CM | POA: Diagnosis present

## 2023-07-15 DIAGNOSIS — I2510 Atherosclerotic heart disease of native coronary artery without angina pectoris: Secondary | ICD-10-CM | POA: Diagnosis present

## 2023-07-15 DIAGNOSIS — Z7952 Long term (current) use of systemic steroids: Secondary | ICD-10-CM

## 2023-07-15 DIAGNOSIS — R5382 Chronic fatigue, unspecified: Secondary | ICD-10-CM | POA: Diagnosis present

## 2023-07-15 DIAGNOSIS — Z90710 Acquired absence of both cervix and uterus: Secondary | ICD-10-CM

## 2023-07-15 DIAGNOSIS — E1122 Type 2 diabetes mellitus with diabetic chronic kidney disease: Secondary | ICD-10-CM | POA: Diagnosis not present

## 2023-07-15 DIAGNOSIS — M19072 Primary osteoarthritis, left ankle and foot: Secondary | ICD-10-CM | POA: Diagnosis present

## 2023-07-15 DIAGNOSIS — I509 Heart failure, unspecified: Secondary | ICD-10-CM | POA: Diagnosis not present

## 2023-07-15 DIAGNOSIS — M05741 Rheumatoid arthritis with rheumatoid factor of right hand without organ or systems involvement: Secondary | ICD-10-CM

## 2023-07-15 DIAGNOSIS — R6521 Severe sepsis with septic shock: Principal | ICD-10-CM

## 2023-07-15 DIAGNOSIS — K219 Gastro-esophageal reflux disease without esophagitis: Secondary | ICD-10-CM | POA: Diagnosis present

## 2023-07-15 DIAGNOSIS — M19071 Primary osteoarthritis, right ankle and foot: Secondary | ICD-10-CM | POA: Diagnosis present

## 2023-07-15 DIAGNOSIS — Z8249 Family history of ischemic heart disease and other diseases of the circulatory system: Secondary | ICD-10-CM

## 2023-07-15 DIAGNOSIS — M6281 Muscle weakness (generalized): Secondary | ICD-10-CM | POA: Diagnosis not present

## 2023-07-15 DIAGNOSIS — M0510 Rheumatoid lung disease with rheumatoid arthritis of unspecified site: Secondary | ICD-10-CM | POA: Diagnosis present

## 2023-07-15 DIAGNOSIS — Z1152 Encounter for screening for COVID-19: Secondary | ICD-10-CM

## 2023-07-15 DIAGNOSIS — M069 Rheumatoid arthritis, unspecified: Secondary | ICD-10-CM | POA: Diagnosis present

## 2023-07-15 DIAGNOSIS — G3184 Mild cognitive impairment, so stated: Secondary | ICD-10-CM | POA: Diagnosis present

## 2023-07-15 DIAGNOSIS — R918 Other nonspecific abnormal finding of lung field: Secondary | ICD-10-CM | POA: Diagnosis not present

## 2023-07-15 DIAGNOSIS — M109 Gout, unspecified: Secondary | ICD-10-CM | POA: Diagnosis present

## 2023-07-15 DIAGNOSIS — J9611 Chronic respiratory failure with hypoxia: Secondary | ICD-10-CM | POA: Diagnosis present

## 2023-07-15 DIAGNOSIS — N179 Acute kidney failure, unspecified: Secondary | ICD-10-CM | POA: Diagnosis present

## 2023-07-15 DIAGNOSIS — R6520 Severe sepsis without septic shock: Secondary | ICD-10-CM | POA: Diagnosis not present

## 2023-07-15 DIAGNOSIS — I9589 Other hypotension: Secondary | ICD-10-CM | POA: Diagnosis present

## 2023-07-15 DIAGNOSIS — G894 Chronic pain syndrome: Secondary | ICD-10-CM | POA: Diagnosis present

## 2023-07-15 DIAGNOSIS — Z885 Allergy status to narcotic agent status: Secondary | ICD-10-CM

## 2023-07-15 DIAGNOSIS — E2740 Unspecified adrenocortical insufficiency: Secondary | ICD-10-CM | POA: Diagnosis not present

## 2023-07-15 DIAGNOSIS — R4182 Altered mental status, unspecified: Secondary | ICD-10-CM | POA: Diagnosis not present

## 2023-07-15 DIAGNOSIS — Z6835 Body mass index (BMI) 35.0-35.9, adult: Secondary | ICD-10-CM | POA: Diagnosis not present

## 2023-07-15 DIAGNOSIS — R0902 Hypoxemia: Secondary | ICD-10-CM | POA: Diagnosis not present

## 2023-07-15 DIAGNOSIS — R636 Underweight: Secondary | ICD-10-CM | POA: Diagnosis present

## 2023-07-15 DIAGNOSIS — R000 Tachycardia, unspecified: Secondary | ICD-10-CM | POA: Diagnosis not present

## 2023-07-15 DIAGNOSIS — F0393 Unspecified dementia, unspecified severity, with mood disturbance: Secondary | ICD-10-CM | POA: Diagnosis present

## 2023-07-15 DIAGNOSIS — I82409 Acute embolism and thrombosis of unspecified deep veins of unspecified lower extremity: Secondary | ICD-10-CM | POA: Diagnosis present

## 2023-07-15 DIAGNOSIS — Z833 Family history of diabetes mellitus: Secondary | ICD-10-CM

## 2023-07-15 DIAGNOSIS — Z7985 Long-term (current) use of injectable non-insulin antidiabetic drugs: Secondary | ICD-10-CM

## 2023-07-15 DIAGNOSIS — M170 Bilateral primary osteoarthritis of knee: Secondary | ICD-10-CM | POA: Diagnosis present

## 2023-07-15 DIAGNOSIS — F411 Generalized anxiety disorder: Secondary | ICD-10-CM | POA: Diagnosis not present

## 2023-07-15 DIAGNOSIS — E66812 Obesity, class 2: Secondary | ICD-10-CM | POA: Diagnosis present

## 2023-07-15 DIAGNOSIS — Z86011 Personal history of benign neoplasm of the brain: Secondary | ICD-10-CM

## 2023-07-15 DIAGNOSIS — Z741 Need for assistance with personal care: Secondary | ICD-10-CM | POA: Diagnosis not present

## 2023-07-15 DIAGNOSIS — R1032 Left lower quadrant pain: Secondary | ICD-10-CM | POA: Diagnosis not present

## 2023-07-15 DIAGNOSIS — Z9181 History of falling: Secondary | ICD-10-CM

## 2023-07-15 DIAGNOSIS — R531 Weakness: Secondary | ICD-10-CM | POA: Diagnosis not present

## 2023-07-15 DIAGNOSIS — J168 Pneumonia due to other specified infectious organisms: Secondary | ICD-10-CM | POA: Diagnosis not present

## 2023-07-15 DIAGNOSIS — D509 Iron deficiency anemia, unspecified: Secondary | ICD-10-CM | POA: Diagnosis not present

## 2023-07-15 DIAGNOSIS — E861 Hypovolemia: Secondary | ICD-10-CM | POA: Diagnosis present

## 2023-07-15 DIAGNOSIS — Z8042 Family history of malignant neoplasm of prostate: Secondary | ICD-10-CM

## 2023-07-15 DIAGNOSIS — Z933 Colostomy status: Secondary | ICD-10-CM | POA: Diagnosis not present

## 2023-07-15 LAB — I-STAT CHEM 8, ED
BUN: 18 mg/dL (ref 8–23)
Calcium, Ion: 0.94 mmol/L — ABNORMAL LOW (ref 1.15–1.40)
Chloride: 106 mmol/L (ref 98–111)
Creatinine, Ser: 1.1 mg/dL — ABNORMAL HIGH (ref 0.44–1.00)
Glucose, Bld: 104 mg/dL — ABNORMAL HIGH (ref 70–99)
HCT: 38 % (ref 36.0–46.0)
Hemoglobin: 12.9 g/dL (ref 12.0–15.0)
Potassium: 3.3 mmol/L — ABNORMAL LOW (ref 3.5–5.1)
Sodium: 140 mmol/L (ref 135–145)
TCO2: 24 mmol/L (ref 22–32)

## 2023-07-15 LAB — CBC WITH DIFFERENTIAL/PLATELET
Abs Immature Granulocytes: 0.05 10*3/uL (ref 0.00–0.07)
Basophils Absolute: 0.1 10*3/uL (ref 0.0–0.1)
Basophils Relative: 1 %
Eosinophils Absolute: 0.2 10*3/uL (ref 0.0–0.5)
Eosinophils Relative: 1 %
HCT: 38.7 % (ref 36.0–46.0)
Hemoglobin: 11.8 g/dL — ABNORMAL LOW (ref 12.0–15.0)
Immature Granulocytes: 0 %
Lymphocytes Relative: 53 %
Lymphs Abs: 6.3 10*3/uL — ABNORMAL HIGH (ref 0.7–4.0)
MCH: 30.4 pg (ref 26.0–34.0)
MCHC: 30.5 g/dL (ref 30.0–36.0)
MCV: 99.7 fL (ref 80.0–100.0)
Monocytes Absolute: 1.4 10*3/uL — ABNORMAL HIGH (ref 0.1–1.0)
Monocytes Relative: 12 %
Neutro Abs: 3.9 10*3/uL (ref 1.7–7.7)
Neutrophils Relative %: 33 %
Platelets: 207 10*3/uL (ref 150–400)
RBC: 3.88 MIL/uL (ref 3.87–5.11)
RDW: 16.6 % — ABNORMAL HIGH (ref 11.5–15.5)
WBC: 11.9 10*3/uL — ABNORMAL HIGH (ref 4.0–10.5)
nRBC: 0 % (ref 0.0–0.2)

## 2023-07-15 LAB — URINALYSIS, W/ REFLEX TO CULTURE (INFECTION SUSPECTED)
Bilirubin Urine: NEGATIVE
Glucose, UA: NEGATIVE mg/dL
Hgb urine dipstick: NEGATIVE
Ketones, ur: 5 mg/dL — AB
Leukocytes,Ua: NEGATIVE
Nitrite: NEGATIVE
Protein, ur: NEGATIVE mg/dL
Specific Gravity, Urine: >1.046 — ABNORMAL HIGH (ref 1.005–1.030)
pH: 5 (ref 5.0–8.0)

## 2023-07-15 LAB — COMPREHENSIVE METABOLIC PANEL
ALT: 11 U/L (ref 0–44)
AST: 15 U/L (ref 15–41)
Albumin: 2.5 g/dL — ABNORMAL LOW (ref 3.5–5.0)
Alkaline Phosphatase: 45 U/L (ref 38–126)
Anion gap: 14 (ref 5–15)
BUN: 17 mg/dL (ref 8–23)
CO2: 21 mmol/L — ABNORMAL LOW (ref 22–32)
Calcium: 8.3 mg/dL — ABNORMAL LOW (ref 8.9–10.3)
Chloride: 105 mmol/L (ref 98–111)
Creatinine, Ser: 1.09 mg/dL — ABNORMAL HIGH (ref 0.44–1.00)
GFR, Estimated: 51 mL/min — ABNORMAL LOW (ref >60)
Glucose, Bld: 100 mg/dL — ABNORMAL HIGH (ref 70–99)
Potassium: 3.4 mmol/L — ABNORMAL LOW (ref 3.5–5.1)
Sodium: 140 mmol/L (ref 135–145)
Total Bilirubin: 0.6 mg/dL (ref 0.0–1.2)
Total Protein: 5.2 g/dL — ABNORMAL LOW (ref 6.5–8.1)

## 2023-07-15 LAB — I-STAT CG4 LACTIC ACID, ED
Lactic Acid, Venous: 1.6 mmol/L (ref 0.5–1.9)
Lactic Acid, Venous: 3.1 mmol/L (ref 0.5–1.9)
Lactic Acid, Venous: 1.2 mmol/L (ref 0.5–1.9)

## 2023-07-15 LAB — LIPASE, BLOOD: Lipase: 20 U/L (ref 11–51)

## 2023-07-15 LAB — TYPE AND SCREEN
ABO/RH(D): O POS
Antibody Screen: NEGATIVE

## 2023-07-15 LAB — APTT: aPTT: 31 s (ref 24–36)

## 2023-07-15 LAB — RESP PANEL BY RT-PCR (RSV, FLU A&B, COVID)  RVPGX2
Influenza A by PCR: NEGATIVE
Influenza B by PCR: NEGATIVE
Resp Syncytial Virus by PCR: NEGATIVE
SARS Coronavirus 2 by RT PCR: NEGATIVE

## 2023-07-15 LAB — PROTIME-INR
INR: 1.2 (ref 0.8–1.2)
Prothrombin Time: 15.8 s — ABNORMAL HIGH (ref 11.4–15.2)

## 2023-07-15 LAB — POC OCCULT BLOOD, ED: Fecal Occult Bld: POSITIVE — AB

## 2023-07-15 MED ORDER — DICLOFENAC SODIUM 1 % EX GEL
2.0000 g | Freq: Three times a day (TID) | CUTANEOUS | Status: DC
  Administered 2023-07-15 – 2023-07-20 (×12): 2 g via TOPICAL
  Filled 2023-07-15: qty 100

## 2023-07-15 MED ORDER — ATORVASTATIN CALCIUM 10 MG PO TABS
10.0000 mg | ORAL_TABLET | Freq: Every day | ORAL | Status: DC
  Administered 2023-07-16 – 2023-07-20 (×5): 10 mg via ORAL
  Filled 2023-07-15 (×5): qty 1

## 2023-07-15 MED ORDER — LEVOTHYROXINE SODIUM 50 MCG PO TABS
50.0000 ug | ORAL_TABLET | Freq: Every day | ORAL | Status: DC
  Administered 2023-07-16 – 2023-07-20 (×5): 50 ug via ORAL
  Filled 2023-07-15: qty 2
  Filled 2023-07-15 (×4): qty 1

## 2023-07-15 MED ORDER — ALBUTEROL SULFATE HFA 108 (90 BASE) MCG/ACT IN AERS: 2.0000 | INHALATION_SPRAY | RESPIRATORY_TRACT | Status: DC | PRN

## 2023-07-15 MED ORDER — POTASSIUM CHLORIDE 20 MEQ PO PACK
40.0000 meq | PACK | Freq: Once | ORAL | Status: AC
  Administered 2023-07-15: 40 meq via ORAL
  Filled 2023-07-15: qty 2

## 2023-07-15 MED ORDER — ONDANSETRON HCL 4 MG/2ML IJ SOLN
4.0000 mg | Freq: Three times a day (TID) | INTRAMUSCULAR | Status: DC | PRN
  Administered 2023-07-15 – 2023-07-20 (×2): 4 mg via INTRAVENOUS
  Filled 2023-07-15 (×2): qty 2

## 2023-07-15 MED ORDER — DULOXETINE HCL 60 MG PO CPEP
60.0000 mg | ORAL_CAPSULE | Freq: Every day | ORAL | Status: DC
  Administered 2023-07-16 – 2023-07-20 (×5): 60 mg via ORAL
  Filled 2023-07-15: qty 1
  Filled 2023-07-15: qty 2
  Filled 2023-07-15 (×3): qty 1

## 2023-07-15 MED ORDER — LACTATED RINGERS IV BOLUS
1000.0000 mL | Freq: Once | INTRAVENOUS | Status: AC
  Administered 2023-07-15: 1000 mL via INTRAVENOUS

## 2023-07-15 MED ORDER — METRONIDAZOLE 500 MG/100ML IV SOLN
500.0000 mg | Freq: Once | INTRAVENOUS | Status: AC
  Administered 2023-07-15: 500 mg via INTRAVENOUS
  Filled 2023-07-15: qty 100

## 2023-07-15 MED ORDER — PREDNISONE 5 MG PO TABS: 10.0000 mg | ORAL_TABLET | Freq: Every day | ORAL | Status: DC

## 2023-07-15 MED ORDER — ACETAMINOPHEN 325 MG PO TABS
650.0000 mg | ORAL_TABLET | Freq: Four times a day (QID) | ORAL | Status: DC | PRN
  Administered 2023-07-16: 650 mg via ORAL
  Filled 2023-07-15: qty 2

## 2023-07-15 MED ORDER — VANCOMYCIN HCL IN DEXTROSE 1-5 GM/200ML-% IV SOLN: 1000.0000 mg | Freq: Once | INTRAVENOUS | Status: DC

## 2023-07-15 MED ORDER — PANTOPRAZOLE SODIUM 40 MG IV SOLR
40.0000 mg | Freq: Once | INTRAVENOUS | Status: AC
  Administered 2023-07-15: 40 mg via INTRAVENOUS
  Filled 2023-07-15: qty 10

## 2023-07-15 MED ORDER — GABAPENTIN 300 MG PO CAPS
300.0000 mg | ORAL_CAPSULE | Freq: Two times a day (BID) | ORAL | Status: DC
  Administered 2023-07-15 – 2023-07-20 (×10): 300 mg via ORAL
  Filled 2023-07-15 (×10): qty 1

## 2023-07-15 MED ORDER — SODIUM CHLORIDE 0.9 % IV SOLN
2.0000 g | Freq: Once | INTRAVENOUS | Status: AC
  Administered 2023-07-15: 2 g via INTRAVENOUS
  Filled 2023-07-15: qty 12.5

## 2023-07-15 MED ORDER — SODIUM CHLORIDE 0.9 % IV SOLN
2.0000 g | INTRAVENOUS | Status: DC
  Administered 2023-07-15 – 2023-07-17 (×3): 2 g via INTRAVENOUS
  Filled 2023-07-15 (×3): qty 20

## 2023-07-15 MED ORDER — OXYCODONE HCL 5 MG PO TABS: 5.0000 mg | ORAL_TABLET | Freq: Four times a day (QID) | ORAL | Status: DC | PRN

## 2023-07-15 MED ORDER — PANTOPRAZOLE SODIUM 40 MG IV SOLR
40.0000 mg | Freq: Two times a day (BID) | INTRAVENOUS | Status: DC
  Administered 2023-07-15 – 2023-07-18 (×6): 40 mg via INTRAVENOUS
  Filled 2023-07-15 (×6): qty 10

## 2023-07-15 MED ORDER — SODIUM CHLORIDE 0.9% FLUSH
3.0000 mL | Freq: Two times a day (BID) | INTRAVENOUS | Status: DC
  Administered 2023-07-15 – 2023-07-20 (×8): 3 mL via INTRAVENOUS

## 2023-07-15 MED ORDER — VANCOMYCIN HCL IN DEXTROSE 1-5 GM/200ML-% IV SOLN
1000.0000 mg | Freq: Once | INTRAVENOUS | Status: AC
  Administered 2023-07-15: 1000 mg via INTRAVENOUS
  Filled 2023-07-15: qty 200

## 2023-07-15 MED ORDER — METRONIDAZOLE 500 MG/100ML IV SOLN
500.0000 mg | Freq: Two times a day (BID) | INTRAVENOUS | Status: DC
  Administered 2023-07-16 – 2023-07-18 (×4): 500 mg via INTRAVENOUS
  Filled 2023-07-15 (×5): qty 100

## 2023-07-15 MED ORDER — IOHEXOL 350 MG/ML SOLN
75.0000 mL | Freq: Once | INTRAVENOUS | Status: AC | PRN
  Administered 2023-07-15: 75 mL via INTRAVENOUS

## 2023-07-15 MED ORDER — ACETAMINOPHEN 650 MG RE SUPP: 650.0000 mg | Freq: Four times a day (QID) | RECTAL | Status: DC | PRN

## 2023-07-15 MED ORDER — MORPHINE SULFATE ER 15 MG PO TBCR
15.0000 mg | EXTENDED_RELEASE_TABLET | Freq: Two times a day (BID) | ORAL | Status: DC
  Administered 2023-07-15 – 2023-07-20 (×10): 15 mg via ORAL
  Filled 2023-07-15 (×10): qty 1

## 2023-07-15 MED ORDER — LIDOCAINE 5 % EX PTCH
1.0000 | MEDICATED_PATCH | CUTANEOUS | Status: DC
  Administered 2023-07-17 – 2023-07-20 (×5): 1 via TRANSDERMAL
  Filled 2023-07-15 (×5): qty 1

## 2023-07-15 MED ORDER — INSULIN ASPART 100 UNIT/ML IJ SOLN: 0.0000 [IU] | Freq: Three times a day (TID) | INTRAMUSCULAR | Status: DC

## 2023-07-15 NOTE — ED Notes (Signed)
 This RN notified EDP of hypotension. EDP to bedside. Patient in trendelenburg

## 2023-07-15 NOTE — Sepsis Progress Note (Signed)
 Code Sepsis protocol being monitored by eLink.

## 2023-07-15 NOTE — ED Notes (Signed)
 Last lactic 1.2 2nd not needed

## 2023-07-15 NOTE — H&P (Signed)
 History and Physical   [name] ZOX:[id number] DOB: [birth year] DOA: 07/15/2023  PCP: [name], MD   Patient coming from: Home/SNF  Chief Complaint: Weber Cooks pain  HPI: [name] is a 83 y.o. female with medical history significant of hypertension, hyperlipidemia, diabetes, neuropathy, hypothyroidism, CKD, GERD, gout, ILD, bronchiectasis, chronic respiratory failure, chronic diastolic CHF, mild cognitive impairment, obesity, rheumatoid arthritis, GI bleed, DVT, PE, chronic pain presenting with weakness and abdominal pain.  Reports patient was initially sent from his facility due to some generalized weakness and concern for TIA.  Patient does not report any focal weakness and instead is more concerned about her abdominal pain.  For context, she was admitted with sepsis about a month ago also had viral respiratory illnesses at that time.  She was recently admitted to [facility] and discharged on 2/21 for ileitis/colitis and sigmoid stricture.  Ultimately infectious etiology was ruled out and patient was discharged home after found to be poor surgical candidate.  She reports ongoing black stools for about a month.  Reports having a bowel movement yesterday and passing some gas as well.  History of prior ileus.  Persistent pain at the left lower quadrant.  She denies fevers, chills, chest pain, shortness of breath, nausea, vomiting.   ED Course: Vital signs in the ED notable for initial hypotension has improved to the 110s systolic, heart rate in the 110s, on chronic 3 L.  Lab workup included CMP with potassium 3.4, bicarb 21, creatinine 1.09 which is mildly elevated from baseline, glucose 100, calcium 8.3, protein 5.2, almond 2.5.  CBC with leukocytosis 12.9, hemoglobin stable 11.8.  PT 15.8.  PTT and INR normal.  Lactic acid normal, 3.1, normal.  Lipase normal.  FOBT positive.  Rester panel for flu COVID RSV negative.  Type and screen performed in the ED.  Urinalysis with  ketones and rare bacteria only.  Blood cultures pending.  Chest x-ray showed some streaky opacities possibly representing atelectasis versus pneumonia in the setting of known ILD.  CT head showed no acute abnormality.  CT abdomen pelvis showed stable basilar scarring, dilated colon consistent with ileus.  Wall thickening at transverse colon possibly secondary to lack of distention versus infection/inflammation, abrupt caliber change of the sigmoid colon possibly secondary to stricture as noted previously.  Patient received vancomycin, cefepime, Flagyl in the ED.  Also received IV PPI and 3 L IV fluids.  Review of Systems: As per HPI otherwise all other systems reviewed and are negative.  Past Medical History:  Diagnosis Date   Acute encephalopathy 03/30/2021   11/12 - 04/03/2021 acute encephalopathy in the context of altered mental status and poor oral intake following increasing pain medicines and associated Klebsiella UTI.     Acute kidney injury superimposed on chronic kidney disease (HCC) 04/17/2015   11/12 - 04/03/2021 AKI with creatinine of 2.22 and GFR 22 indicating stage IV renal disease in the context of encephalopathy secondary to increased pain medications with decreased oral intake of fluids and nutrition.     Acute on chronic diastolic (congestive) heart failure (HCC)    Anemia    iron deficiency anemia - secondary to blood loss ( chronic)    Anxiety    Arthritis    endstage changes bilateral knees/bilateral ankles.    Asthma    Carotid artery occlusion    Chest pain of uncertain etiology 01/28/2012   Normal coronaries at cath     Chronic fatigue    Chronic kidney disease  Closed intertrochanteric fracture of hip, left, initial encounter (HCC) 11/29/2016   Closed left hip fracture (HCC)    Closed nondisplaced intertrochanteric fracture of left femur with delayed healing 01/14/2017   Clotting disorder (HCC)    pt denies this   Contusion of left knee    due to fall 1/14.    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     Morbid obesity (HCC)    Neuromuscular disorder (HCC)    diabetic neuropathy    Normal coronary arteries    cardiac catheterization performed  10/31/14   Pneumonia    RA (rheumatoid arthritis) (HCC)    has been off methotreaxte since 10/13.   Septic shock (HCC) 06/13/2023   Spinal stenosis of lumbar region    Thyroid disease    Unspecified lack of coordination    URI (upper respiratory infection)    UTI (urinary tract infection) 03/31/2021   11/12 - 04/03/2021 Klebsiella UTI associated with encephalopathy in the context of AKI.  Status post IV ceftriaxone followed by oral Keflex x48 hours.      Past Surgical History:  Procedure Laterality Date   BRAIN SURGERY     Gamma knife 10/13. Needs repeat spring  '14   CARDIAC CATHETERIZATION N/A 10/31/2014   Procedure: Right/Left Heart Cath and Coronary Angiography;  Surgeon: Corky Crafts, MD;  Location: [facility] INVASIVE CV LAB;  Service: Cardiovascular;  Laterality: N/A;   COLONOSCOPY WITH PROPOFOL N/A 04/14/2022   Procedure: COLONOSCOPY WITH PROPOFOL;  Surgeon: Beverley Fiedler, MD;  Location: [facility] ENDOSCOPY;  Service: Gastroenterology;  Laterality: N/A;   CYST EXCISION     2 on face   CYST REMOVAL NECK     ESOPHAGOGASTRODUODENOSCOPY (EGD) WITH PROPOFOL N/A 09/14/2014   Procedure: ESOPHAGOGASTRODUODENOSCOPY (EGD) WITH PROPOFOL;  Surgeon: Louis Meckel, MD;  Location: [facility] ENDOSCOPY;  Service:  Endoscopy;  Laterality: N/A;   INCISION AND DRAINAGE HIP Left 01/16/2017   Procedure: IRRIGATION AND DEBRIDEMENT LEFT HIP;  Surgeon: Kathryne Hitch, MD;  Location: [facility] ORS;  Service: Orthopedics;  Laterality: Left;   INTRAMEDULLARY (IM) NAIL INTERTROCHANTERIC Left 11/29/2016   Procedure: INTRAMEDULLARY (IM) NAIL INTERTROCHANTRIC;  Surgeon: Kathryne Hitch, MD;  Location: [facility] OR;  Service: Orthopedics;  Laterality: Left;   OVARY SURGERY     RIGHT HEART CATH N/A 08/12/2021   Procedure: RIGHT HEART CATH;  Surgeon: Runell Gess, MD;  Location: [facility] INVASIVE CV LAB;  Service: Cardiovascular;  Laterality: N/A;   SHOULDER SURGERY Left    TONSILLECTOMY  age 83   VAGINAL HYSTERECTOMY     VIDEO BRONCHOSCOPY Bilateral 05/31/2013   Procedure: VIDEO BRONCHOSCOPY WITHOUT FLUORO;  Surgeon: Kalman Shan, MD;  Location: [facility] ENDOSCOPY;  Service: Cardiopulmonary;  Laterality: Bilateral;   video bronscoscopy  05/19/1998   lung    Social History  reports that she has never smoked. She has never used smokeless  tobacco. She reports that she does not drink alcohol and does not use drugs.  Allergies  Allergen Reactions   Codeine Swelling and Other (See Comments)    Facial swelling, Chest pain, and swelling in legs    Infliximab Anaphylaxis    (REMICADE) "sent me into shock"    Lisinopril Swelling and Rash    Face and neck swelling    Ofev [Nintedanib] Diarrhea    Family History  Problem Relation Age of Onset   Diabetes Mother    Heart attack Mother    Hypertension Father    Lung cancer Father    Diabetes Sister    Breast cancer Sister    Breast cancer Sister    Diabetes Brother    Hypertension Brother    Heart disease Brother    Heart attack Brother    Kidney cancer Brother    Gout Brother    Kidney failure Brother        x 5   Esophageal cancer Brother    Stomach cancer Brother    Prostate cancer Brother    Uterine cancer Daughter        with mets   Fibroids  Daughter    Hypertension Daughter    Hypertension Daughter    Hypertension Son    Heart murmur Son    Rheum arthritis Maternal Uncle    Alzheimer's disease Neg Hx    Dementia Neg Hx   Reviewed on admission  Prior to Admission medications   Medication Sig Start Date End Date Taking? Authorizing Provider  albuterol (VENTOLIN HFA) 108 (90 Base) MCG/ACT inhaler Inhale 2 puffs into the lungs as needed for wheezing or shortness of breath.   Yes [provider]  Artificial Tear Ointment (DRY EYES OP) Place 2 drops into both eyes as needed (dry eyes).   Yes [provider]  atorvastatin (LIPITOR) 10 MG tablet Take 1 tablet (10 mg total) by mouth daily. 08/14/22  Yes [name], MD  azithromycin (ZITHROMAX) 250 MG tablet Take 1 tablet (250 mg total) by mouth every Monday, Wednesday, and Friday. Take every Monday, Wednesday, Friday. 06/03/23 06/02/24 Yes Kalman Shan, MD  Calcium Carb-Cholecalciferol (CALCIUM + D3 PO) Take 2 tablets by mouth daily.   Yes [provider]  Camphor-Menthol-Methyl Sal (SALONPAS EX) Apply 1 patch topically as needed (shoulder and leg pain).   Yes [provider]  Cholecalciferol (VITAMIN D3) 50 MCG (2000 UT) capsule Take 2,000 Units by mouth daily.   Yes [provider]  diclofenac Sodium (VOLTAREN) 1 % GEL Apply 2 g topically 3 (three) times daily. Apply to both hands and knees Patient taking differently: Apply 2 g topically in the morning, at noon, and at bedtime. Apply to both hands and knees. 05/03/21  Yes Medina-Vargas, Monina C, NP  dicyclomine (BENTYL) 20 MG tablet Take 1 tablet (20 mg total) by mouth 4 (four) times daily -  before meals and at bedtime. 07/10/23  Yes Sreenath, Sudheer B, MD  diphenoxylate-atropine (LOMOTIL) 2.5-0.025 MG tablet Take 1 tablet by mouth 4 (four) times daily as needed for diarrhea or loose stools. 07/09/23  Yes Sreenath, Sudheer B, MD  docusate sodium (COLACE) 100 MG capsule Take 2  capsules (200 mg total) by mouth 2 (two) times daily. Hold for diarrhea. 06/26/23  Yes Elgergawy, Leana Roe, MD  DULoxetine (CYMBALTA) 60 MG capsule Take 1 capsule (60 mg total) by mouth daily. 11/27/22  Yes Jones Bales, NP  ferrous sulfate 324 MG TBEC  Take 324 mg by mouth every evening.   Yes [provider]  fluticasone (FLONASE) 50 MCG/ACT nasal spray Place 1 spray into both nostrils daily. 11/28/22  Yes [name], MD  furosemide (LASIX) 20 MG tablet Take 3 tablets (60 mg total) by mouth daily. Patient taking differently: Take 20 mg by mouth daily. 06/27/23  Yes Elgergawy, Leana Roe, MD  gabapentin (NEURONTIN) 600 MG tablet Take 0.5 tablets (300 mg total) by mouth 2 (two) times daily. 06/26/23  Yes Elgergawy, Leana Roe, MD  guaiFENesin (MUCINEX) 600 MG 12 hr tablet Take 2 tablets (1,200 mg total) by mouth 2 (two) times daily. 05/03/21  Yes Medina-Vargas, Monina C, NP  hydrALAZINE (APRESOLINE) 25 MG tablet Take 25 mg by mouth 2 (two) times daily.   Yes [provider]  hydroxychloroquine (PLAQUENIL) 200 MG tablet Take 1 tablet (200 mg total) by mouth daily. 05/03/21  Yes Medina-Vargas, Monina C, NP  leflunomide (ARAVA) 20 MG tablet Take 1 tablet (20 mg total) by mouth daily. 05/03/21 08/09/23 Yes Medina-Vargas, Monina C, NP  levothyroxine (SYNTHROID) 50 MCG tablet Take 1 tablet (50 mcg total) by mouth daily. 11/28/22  Yes [name], MD  lidocaine (LIDODERM) 5 % Place 1 patch onto the skin daily. Remove & Discard patch within 12 hours or as directed by MD 12/24/22  Yes [name], MD  linaclotide (LINZESS) 72 MCG capsule Take 72 mcg by mouth daily before breakfast.   Yes [provider]  metolazone (ZAROXOLYN) 2.5 MG tablet TAKE 1 TABLET BY MOUTH DAILY AS NEEDED FOR 2 DAYS FOR WEIGHT ABOVE 255 POUNDS 05/03/21  Yes Medina-Vargas, Monina C, NP  metoprolol succinate (TOPROL-XL) 50 MG 24 hr tablet Take 1 tablet (50 mg total) by mouth daily. Take with or immediately  following a meal. 07/10/23  Yes Sreenath, Sudheer B, MD  morphine (MS CONTIN) 15 MG 12 hr tablet Take 1 tablet (15 mg total) by mouth every 12 (twelve) hours. SNF use only 07/09/23  Yes Sreenath, Sudheer B, MD  Multiple Vitamin (MULTIVITAMIN WITH MINERALS) TABS Take 1 tablet by mouth daily.    Yes [provider]  olmesartan (BENICAR) 40 MG tablet Take 40 mg by mouth daily.   Yes [provider]  ondansetron (ZOFRAN-ODT) 4 MG disintegrating tablet Take 1 tablet (4 mg total) by mouth every 8 (eight) hours as needed for nausea or vomiting. 06/12/23  Yes [name], MD  oxyCODONE (OXY IR/ROXICODONE) 5 MG immediate release tablet Take 1 tablet (5 mg total) by mouth every 6 (six) hours as needed for moderate pain (pain score 4-6). SNF use only 07/09/23  Yes Sreenath, Sudheer B, MD  pantoprazole (PROTONIX) 40 MG tablet Take 1 tablet (40 mg total) by mouth 2 (two) times daily. 06/23/23  Yes [name], MD  polyethylene glycol (MIRALAX / GLYCOLAX) 17 g packet Take 17 g by mouth 2 (two) times daily. Hold for diarrhea 06/26/23  Yes Elgergawy, Leana Roe, MD  Potassium Chloride ER 20 MEQ TBCR Please take 20 mg p.o. twice daily for first 3 days, then go back to 20 mg oral daily, monitor frequently and adjust as needed Patient taking differently: Take 20 mEq by mouth daily. Please take 20 mg p.o. twice daily for first 3 days, then go back to 20 mg oral daily, monitor frequently and adjust as needed 06/26/23  Yes Elgergawy, Leana Roe, MD  predniSONE (DELTASONE) 10 MG tablet Take 1 tablet (10 mg total) by mouth daily with breakfast.  05/22/21  Yes [name], MD  Semaglutide,0.25 or 0.5MG /DOS, (OZEMPIC, 0.25 OR 0.5 MG/DOSE,) 2 MG/1.5ML SOPN Inject 0.5 mg into the skin once a week.   Yes [provider]  simethicone (MYLICON) 80 MG chewable tablet Chew 1 tablet (80 mg total) by mouth every 6 (six) hours as needed for flatulence. 06/26/23  Yes Elgergawy, Leana Roe, MD  zinc oxide 20 % ointment  Apply 1 Application topically See admin instructions. Every shift. Apply to MASD to left buttocks and with each incontinent episode.   Yes [provider]  allopurinol (ZYLOPRIM) 300 MG tablet Take 1 tablet (300 mg total) by mouth daily. Patient not taking: Reported on 07/15/2023 05/03/21   Medina-Vargas, Monina C, NP  colchicine 0.6 MG tablet Take 1 tablet (0.6 mg total) by mouth daily as needed (gout flares). Patient not taking: Reported on 07/15/2023 05/03/21   Medina-Vargas, Monina C, NP  OXYGEN Inhale 3 L into the lungs See admin instructions. 3 L at bedtime, and 3 L during the day as needed for exertion    [provider]    Physical Exam: Vitals:   07/15/23 1630 07/15/23 1700 07/15/23 1730 07/15/23 1800  BP: (!) 147/116 130/69 130/69 116/62  Pulse: (!) 117 (!) 115 (!) 116 (!) 117  Resp: 18 19 17 15   Temp:      TempSrc:      SpO2: 100% 100% 100% 100%    Physical Exam Constitutional:      General: She is not in acute distress.    Appearance: Normal appearance. She is obese.  HENT:     Head: Normocephalic and atraumatic.     Mouth/Throat:     Mouth: Mucous membranes are moist.     Pharynx: Oropharynx is clear.  Eyes:     Extraocular Movements: Extraocular movements intact.     Pupils: Pupils are equal, round, and reactive to light.  Cardiovascular:     Rate and Rhythm: Regular rhythm. Tachycardia present.     Pulses: Normal pulses.     Heart sounds: Normal heart sounds.  Pulmonary:     Effort: Pulmonary effort is normal. No respiratory distress.     Breath sounds: Normal breath sounds.  Abdominal:     General: Bowel sounds are increased. There is no distension.     Palpations: Abdomen is soft.     Tenderness: There is no abdominal tenderness.  Musculoskeletal:        General: No swelling or deformity.  Skin:    General: Skin is warm and dry.  Neurological:     General: No focal deficit present.     Mental Status: Mental status is at baseline.     Labs on Admission: I have personally reviewed following labs and imaging studies  CBC: Recent Labs  Lab 07/15/23 1209 07/15/23 1215  WBC 11.9*  --   NEUTROABS 3.9  --   HGB 11.8* 12.9  HCT 38.7 38.0  MCV 99.7  --   PLT 207  --     Basic Metabolic Panel: Recent Labs  Lab 07/15/23 1209 07/15/23 1215  NA 140 140  K 3.4* 3.3*  CL 105 106  CO2 21*  --   GLUCOSE 100* 104*  BUN 17 18  CREATININE 1.09* 1.10*  CALCIUM 8.3*  --     GFR: CrCl cannot be calculated (Unknown ideal weight.).  Liver Function Tests: Recent Labs  Lab 07/15/23 1209  AST 15  ALT 11  ALKPHOS 45  BILITOT 0.6  PROT 5.2*  ALBUMIN 2.5*    Urine analysis:    Component Value Date/Time   COLORURINE YELLOW 07/15/2023 1616   APPEARANCEUR HAZY (A) 07/15/2023 1616   LABSPEC >1.046 (H) 07/15/2023 1616   PHURINE 5.0 07/15/2023 1616   GLUCOSEU NEGATIVE 07/15/2023 1616   HGBUR NEGATIVE 07/15/2023 1616   BILIRUBINUR NEGATIVE 07/15/2023 1616   BILIRUBINUR 1+ 01/30/2021 1450   KETONESUR 5 (A) 07/15/2023 1616   PROTEINUR NEGATIVE 07/15/2023 1616   UROBILINOGEN negative (A) 01/30/2021 1450   UROBILINOGEN 0.2 11/28/2019 1424   NITRITE NEGATIVE 07/15/2023 1616   LEUKOCYTESUR NEGATIVE 07/15/2023 1616    Radiological Exams on Admission: DG Chest Port 1 View Result Date: 07/15/2023 CLINICAL DATA:  Weakness. EXAM: PORTABLE CHEST 1 VIEW COMPARISON:  Chest radiograph dated 07/01/2023. FINDINGS: The heart size and mediastinal contours are within normal limits. Streaky bibasilar opacities, slightly increased at the right lung base, with background chronic interstitial changes. No sizable pleural effusion or pneumothorax. No acute osseous abnormality. IMPRESSION: Streaky bibasilar opacities, slightly increased on the right, could reflect atelectasis or infiltrate superimposed on chronic interstitial changes. Electronically Signed   By: Hart Robinsons M.D.   On: 07/15/2023 16:36   CT ABDOMEN PELVIS W  CONTRAST Result Date: 07/15/2023 CLINICAL DATA:  Left lower quadrant abdominal pain. EXAM: CT ABDOMEN AND PELVIS WITH CONTRAST TECHNIQUE: Multidetector CT imaging of the abdomen and pelvis was performed using the standard protocol following bolus administration of intravenous contrast. RADIATION DOSE REDUCTION: This exam was performed according to the departmental dose-optimization program which includes automated exposure control, adjustment of the mA and/or kV according to patient size and/or use of iterative reconstruction technique. CONTRAST:  75mL OMNIPAQUE IOHEXOL 350 MG/ML SOLN COMPARISON:  July 05, 2023. FINDINGS: Lower chest: Stable probable bibasilar scarring is noted. Hepatobiliary: No focal liver abnormality is seen. No gallstones, gallbladder wall thickening, or biliary dilatation. Pancreas: Unremarkable. No pancreatic ductal dilatation or surrounding inflammatory changes. Spleen: Normal in size without focal abnormality. Adrenals/Urinary Tract: Adrenal glands appear normal. Stable bilateral renal cysts are noted for which no further follow-up is required. Minimal nonobstructive right nephrolithiasis is noted. No hydronephrosis or renal obstruction is noted. Urinary bladder is decompressed. Stomach/Bowel: The stomach is unremarkable. No small bowel dilatation is noted. Stable colonic dilatation is noted. Mild to moderate wall thickening of proximal transverse colon is noted which may be due to lack of distension, but inflammatory or infectious colitis cannot be excluded. The appendix is not visualized. There is again noted abrupt caliber change involving the sigmoid colon which was present on prior exam, stricture cannot be excluded. Vascular/Lymphatic: Aortic atherosclerosis. No enlarged abdominal or pelvic lymph nodes. Reproductive: Status post hysterectomy. No adnexal masses. Other: No ascites or hernia is noted. Musculoskeletal: No acute osseous abnormality is noted. IMPRESSION: Stable probable  bibasilar scarring is noted in the visualized lung bases. Minimal nonobstructive right nephrolithiasis. Continued dilatation of the colon is noted in most portions, most consistent with ileus. Mild to moderate wall thickening of proximal transverse colon is noted which may be due to lack of distension, but inflammatory or infectious colitis cannot be excluded. There is again noted abrupt caliber change involving the sigmoid colon which was present on prior exam, and stricture cannot be excluded. Aortic Atherosclerosis (ICD10-I70.0). Electronically Signed   By: Lupita Raider M.D.   On: 07/15/2023 15:36   CT Head Wo Contrast Result Date: 07/15/2023 CLINICAL DATA:  Provided history: Weakness. EXAM: CT HEAD WITHOUT CONTRAST TECHNIQUE: Contiguous axial images were obtained from the  base of the skull through the vertex without intravenous contrast. RADIATION DOSE REDUCTION: This exam was performed according to the departmental dose-optimization program which includes automated exposure control, adjustment of the mA and/or kV according to patient size and/or use of iterative reconstruction technique. COMPARISON:  Head CT 06/13/2023. FINDINGS: Brain: Cerebral atrophy. Patchy and ill-defined hypoattenuation within the cerebral white matter, nonspecific but compatible with moderate chronic small vessel ischemic disease. There is no acute intracranial hemorrhage. No demarcated cortical infarct. No extra-axial fluid collection. No evidence of an intracranial mass. No midline shift. Vascular: No hyperdense vessel. Atherosclerotic calcifications. Skull: No calvarial fracture or aggressive osseous lesion. Sinuses/Orbits: No mass or acute finding within the imaged orbits. Minimal mucosal thickening within the bilateral ethmoid and right maxillary sinuses. Other: Small-volume fluid within left mastoid air cells. IMPRESSION: 1. No evidence of an acute intracranial abnormality. 2. Moderate chronic small vessel ischemic changes  within the cerebral white matter. 3. Cerebral atrophy. 4. Small left mastoid effusion. Electronically Signed   By: Jackey Loge D.O.   On: 07/15/2023 14:46   EKG: Independently reviewed.  Sinus tachycardia 117 bpm.  Nonspecific T wave changes.  Low voltage multiple leads.  PAC noted.  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  Hemoglobin currently stable 11.8 but FOBT positive. > Does appear to be on p.o. iron unclear if this is contributing.  Will continue to trend hemoglobin. > GI consulted in the ED and will see the patient.  Received IV PPI and was typed and screened in the ED. - Monitor in progressive unit overnight - Appreciate GI recommendations and assistance - Recheck CBC this evening and continue to trend - Supportive care  Abdominal pain Ileus ?Colitis Leukocytosis > Patient presenting with persistent left lower quadrant pain.  Imaging showed evidence of ileus and transverse colon wall thickening possibly secondary to decompression versus inflammation/infection. > Initially started on broad-spectrum antibiotics in the ED due to hypotension.  Initial lactic acid was normal  repeat was elevated but and third was normal.  Suspect this was due to hemolysis. > Does have leukocytosis to 11.9 in the setting of immunosuppression's for rheumatoid arthritis and no other explanation for her leukocytosis. > Will continue with abdominal antibiotic coverage for now and put patient on bowel rest. - Monitoring on progressive as above - N.p.o. except sips with meds - Continue with ceftriaxone and azithromycin  AKI ?CKD (listed in chart baseline creatinine 0.7) Hypokalemia > Mild hypokalemia 3.4.  Creatinine 1.09 from baseline of 0.7. > Initially hypotensive, improved with 3 L in the ED.  Will watch carefully with history of CHF. - 20 mEq p.o. potassium - Check magnesium - Trend renal  function and electrolytes  Bronchiectasis ILD Chronic respiratory failure with hypoxia > On chronic 3 L. - Holding leflunomide and Plaquenil while evaluating for infection - Continue as needed albuterol  Hypertension - Holding antihypertensives in the setting of presenting with hypotension  Hyperlipidemia - Continue home atorvastatin  Diabetes - SSI  Hypothyroidism - Continue Synthroid  GERD - On IV PPI as above  Gout - Her allopurinol colchicine are currently being held outpatient  Chronic diastolic CHF > Last echo was in January with EF 60 to 65%, G1 DD, normal RV function. - Holding diuretics in the setting of presenting with hypotension  Neuropathy - Continue home gabapentin  Obesity - Noted  Rheumatoid arthritis - Holding leflunomide and Plaquenil as above - Can continue with prednisone  History of DVT and PE - Not on anticoagulation  Chronic pain - Continue scheduled long-acting morphine twice daily - Consider PRN breakthrough pain medication.  If needed.   DVT prophylaxis: SCDs Code Status:   Full Family Communication:  Updated at bedside  Disposition Plan:   Patient is from:  SNF  Anticipated DC to:  Same as above  Anticipated DC date:  1 to 5  days  Anticipated DC barriers: None  Consults called:  Gastroenterology consulted in the ED, plan as of now is to see in the morning of 2/27. Admission status:  Observation, progressive  Severity of Illness: The appropriate patient status for this patient is OBSERVATION. Observation status is judged to be reasonable and necessary in order to provide the required intensity of service to ensure the patient's safety. The patient's presenting symptoms, physical exam findings, and initial radiographic and laboratory data in the context of their medical condition is felt to place them at decreased risk for further clinical deterioration. Furthermore, it is anticipated that the patient will be medically stable for discharge from the hospital within 2 midnights of admission.    Synetta Fail MD [facility]  How to contact the [facility] Attending or Consulting provider 7A - 7P or covering provider during after hours 7P -7A, for this patient?   Check the care team in [facility] and look for a) attending/consulting [facility] provider listed and b) the [facility] team listed Log into [email or web address] and use [facility] universal password to access. If you do not have the password, please contact the hospital operator. Locate the [facility] provider you are looking for under [facility] and page to a number that you can be directly reached. If you still have difficulty reaching the provider, please page the [facility] (Director on Call) for the Hospitalists listed on amion for assistance.  07/15/2023, 7:21 PM

## 2023-07-15 NOTE — ED Provider Notes (Signed)
 [place] EMERGENCY DEPARTMENT AT [facility] Provider Note   CSN: [id number] Arrival date & time: 07/15/23  1149     History  Chief Complaint  Patient presents with   Weakness    [name] is a 83 y.o. female history of hypertension, rheumatoid arthritis, ILD, type 2 diabetes, CHF, GERD, DVT not currently anticoagulated presented for weakness.  Nursing home states they were concerned that patient was having a TIA and called EMS.  Patient states she has not recently hit her head and denies any focal weakness with me or chest pain or shortness of breath or vision changes or headaches but states that over the past month she has had black stools.  Patient denies history of GI bleeds but states that she has had recent lower GI problems and that they want to do a revision due to her ileus from earlier this month.  Patient denies any fevers but states she has pain in the left lower quadrant.  Home Medications Prior to Admission medications   Medication Sig Start Date End Date Taking? Authorizing Provider  albuterol (VENTOLIN HFA) 108 (90 Base) MCG/ACT inhaler Inhale 2 puffs into the lungs as needed for wheezing or shortness of breath.   Yes [provider]  Artificial Tear Ointment (DRY EYES OP) Place 2 drops into both eyes as needed (dry eyes).   Yes [provider]  atorvastatin (LIPITOR) 10 MG tablet Take 1 tablet (10 mg total) by mouth daily. 08/14/22  Yes [name], MD  azithromycin (ZITHROMAX) 250 MG tablet Take 1 tablet (250 mg total) by mouth every Monday, Wednesday, and Friday. Take every Monday, Wednesday, Friday. 06/03/23 06/02/24 Yes Kalman Shan, MD  Calcium Carb-Cholecalciferol (CALCIUM + D3 PO) Take 2 tablets by mouth daily.   Yes [provider]  Camphor-Menthol-Methyl Sal (SALONPAS EX) Apply 1 patch topically as needed (shoulder and leg pain).   Yes [provider]  Cholecalciferol (VITAMIN D3) 50 MCG (2000 UT)  capsule Take 2,000 Units by mouth daily.   Yes [provider]  diclofenac Sodium (VOLTAREN) 1 % GEL Apply 2 g topically 3 (three) times daily. Apply to both hands and knees Patient taking differently: Apply 2 g topically in the morning, at noon, and at bedtime. Apply to both hands and knees. 05/03/21  Yes Medina-Vargas, Monina C, NP  dicyclomine (BENTYL) 20 MG tablet Take 1 tablet (20 mg total) by mouth 4 (four) times daily -  before meals and at bedtime. 07/10/23  Yes Sreenath, Sudheer B, MD  diphenoxylate-atropine (LOMOTIL) 2.5-0.025 MG tablet Take 1 tablet by mouth 4 (four) times daily as needed for diarrhea or loose stools. 07/09/23  Yes Sreenath, Sudheer B, MD  docusate sodium (COLACE) 100 MG capsule Take 2 capsules (200 mg total) by mouth 2 (two) times daily. Hold for diarrhea. 06/26/23  Yes Elgergawy, Leana Roe, MD  DULoxetine (CYMBALTA) 60 MG capsule Take 1 capsule (60 mg total) by mouth daily. 11/27/22  Yes Jones Bales, NP  ferrous sulfate 324 MG TBEC Take 324 mg by mouth every evening.   Yes [provider]  fluticasone (FLONASE) 50 MCG/ACT nasal spray Place 1 spray into both nostrils daily. 11/28/22  Yes [name], MD  furosemide (LASIX) 20 MG tablet Take 3 tablets (60 mg total) by mouth daily. Patient taking differently: Take 20 mg by mouth daily. 06/27/23  Yes Elgergawy, Leana Roe, MD  gabapentin (NEURONTIN) 600 MG tablet Take 0.5 tablets (300 mg total) by mouth  2 (two) times daily. 06/26/23  Yes Elgergawy, Leana Roe, MD  guaiFENesin (MUCINEX) 600 MG 12 hr tablet Take 2 tablets (1,200 mg total) by mouth 2 (two) times daily. 05/03/21  Yes Medina-Vargas, Monina C, NP  hydrALAZINE (APRESOLINE) 25 MG tablet Take 25 mg by mouth 2 (two) times daily.   Yes [provider]  hydroxychloroquine (PLAQUENIL) 200 MG tablet Take 1 tablet (200 mg total) by mouth daily. 05/03/21  Yes Medina-Vargas, Monina C, NP  leflunomide (ARAVA) 20 MG tablet Take 1 tablet (20 mg total) by  mouth daily. 05/03/21 08/09/23 Yes Medina-Vargas, Monina C, NP  levothyroxine (SYNTHROID) 50 MCG tablet Take 1 tablet (50 mcg total) by mouth daily. 11/28/22  Yes [name], MD  lidocaine (LIDODERM) 5 % Place 1 patch onto the skin daily. Remove & Discard patch within 12 hours or as directed by MD 12/24/22  Yes [name], MD  linaclotide (LINZESS) 72 MCG capsule Take 72 mcg by mouth daily before breakfast.   Yes [provider]  metolazone (ZAROXOLYN) 2.5 MG tablet TAKE 1 TABLET BY MOUTH DAILY AS NEEDED FOR 2 DAYS FOR WEIGHT ABOVE 255 POUNDS 05/03/21  Yes Medina-Vargas, Monina C, NP  metoprolol succinate (TOPROL-XL) 50 MG 24 hr tablet Take 1 tablet (50 mg total) by mouth daily. Take with or immediately following a meal. 07/10/23  Yes Sreenath, Sudheer B, MD  morphine (MS CONTIN) 15 MG 12 hr tablet Take 1 tablet (15 mg total) by mouth every 12 (twelve) hours. SNF use only 07/09/23  Yes Sreenath, Sudheer B, MD  Multiple Vitamin (MULTIVITAMIN WITH MINERALS) TABS Take 1 tablet by mouth daily.    Yes [provider]  olmesartan (BENICAR) 40 MG tablet Take 40 mg by mouth daily.   Yes [provider]  ondansetron (ZOFRAN-ODT) 4 MG disintegrating tablet Take 1 tablet (4 mg total) by mouth every 8 (eight) hours as needed for nausea or vomiting. 06/12/23  Yes [name], MD  oxyCODONE (OXY IR/ROXICODONE) 5 MG immediate release tablet Take 1 tablet (5 mg total) by mouth every 6 (six) hours as needed for moderate pain (pain score 4-6). SNF use only 07/09/23  Yes Sreenath, Sudheer B, MD  pantoprazole (PROTONIX) 40 MG tablet Take 1 tablet (40 mg total) by mouth 2 (two) times daily. 06/23/23  Yes [name], MD  polyethylene glycol (MIRALAX / GLYCOLAX) 17 g packet Take 17 g by mouth 2 (two) times daily. Hold for diarrhea 06/26/23  Yes Elgergawy, Leana Roe, MD  Potassium Chloride ER 20 MEQ TBCR Please take 20 mg p.o. twice daily for first 3 days, then go back to 20 mg oral daily,  monitor frequently and adjust as needed Patient taking differently: Take 20 mEq by mouth daily. Please take 20 mg p.o. twice daily for first 3 days, then go back to 20 mg oral daily, monitor frequently and adjust as needed 06/26/23  Yes Elgergawy, Leana Roe, MD  predniSONE (DELTASONE) 10 MG tablet Take 1 tablet (10 mg total) by mouth daily with breakfast. 05/22/21  Yes [name], MD  Semaglutide,0.25 or 0.5MG /DOS, (OZEMPIC, 0.25 OR 0.5 MG/DOSE,) 2 MG/1.5ML SOPN Inject 0.5 mg into the skin once a week.   Yes [provider]  simethicone (MYLICON) 80 MG chewable tablet Chew 1 tablet (80 mg total) by mouth every 6 (six) hours as needed for flatulence. 06/26/23  Yes Elgergawy, Leana Roe, MD  zinc oxide 20 % ointment Apply 1 Application topically See admin instructions. Every  shift. Apply to MASD to left buttocks and with each incontinent episode.   Yes [provider]  allopurinol (ZYLOPRIM) 300 MG tablet Take 1 tablet (300 mg total) by mouth daily. Patient not taking: Reported on 07/15/2023 05/03/21   Medina-Vargas, Monina C, NP  colchicine 0.6 MG tablet Take 1 tablet (0.6 mg total) by mouth daily as needed (gout flares). Patient not taking: Reported on 07/15/2023 05/03/21   Medina-Vargas, Monina C, NP  OXYGEN Inhale 3 L into the lungs See admin instructions. 3 L at bedtime, and 3 L during the day as needed for exertion    [provider]      Allergies    Codeine, Infliximab, Lisinopril, and Ofev [nintedanib]    Review of Systems   Review of Systems  Neurological:  Positive for weakness.    Physical Exam Updated Vital Signs BP (!) 75/47   Pulse (!) 118   Resp 17   SpO2 98%  Physical Exam Vitals reviewed.  Constitutional:      General: She is in acute distress.  HENT:     Head: Normocephalic and atraumatic.  Eyes:     Extraocular Movements: Extraocular movements intact.     Conjunctiva/sclera: Conjunctivae normal.     Pupils: Pupils are equal, round, and  reactive to light.  Cardiovascular:     Rate and Rhythm: Regular rhythm. Tachycardia present.     Pulses: Normal pulses.     Heart sounds: Normal heart sounds.     Comments: 2+ bilateral radial/dorsalis pedis pulses with regular rate Pulmonary:     Effort: Pulmonary effort is normal. No respiratory distress.     Breath sounds: Normal breath sounds.  Abdominal:     Palpations: Abdomen is soft.     Tenderness: There is abdominal tenderness (Left lower quadrant). There is no guarding or rebound.  Musculoskeletal:        General: Normal range of motion.     Cervical back: Normal range of motion and neck supple.     Comments: 5 out of 5 bilateral grip/leg extension strength  Skin:    General: Skin is warm and dry.     Capillary Refill: Capillary refill takes 2 to 3 seconds.  Neurological:     General: No focal deficit present.     Mental Status: She is alert and oriented to person, place, and time.     Sensory: Sensation is intact.     Motor: Motor function is intact.     Coordination: Coordination is intact.     Comments: Sensation intact in all 4 limbs Visual acuity grossly intact Cranial nerves III through XII intact  Psychiatric:        Mood and Affect: Mood normal.    ED Results / Procedures / Treatments   Labs (all labs ordered are listed, but only abnormal results are displayed) Labs Reviewed  CBC WITH DIFFERENTIAL/PLATELET  COMPREHENSIVE METABOLIC PANEL  LIPASE, BLOOD  URINALYSIS, ROUTINE W REFLEX MICROSCOPIC  POC OCCULT BLOOD, ED  I-STAT CHEM 8, ED  I-STAT CG4 LACTIC ACID, ED  TYPE AND SCREEN    EKG None  Radiology No results found.  Procedures .Critical Care  Performed by: Netta Corrigan, PA-C Authorized by: Netta Corrigan, PA-C   Critical care provider statement:    Critical care time (minutes):  40   Critical care time was exclusive of:  Separately billable procedures and treating other patients   Critical care was necessary to treat or prevent  imminent or  life-threatening deterioration of the following conditions:  Shock   Critical care was time spent personally by me on the following activities:  Blood draw for specimens, development of treatment plan with patient or surrogate, evaluation of patient's response to treatment, examination of patient, obtaining history from patient or surrogate, review of old charts, re-evaluation of patient's condition, pulse oximetry, ordering and review of radiographic studies, ordering and review of laboratory studies and ordering and performing treatments and interventions   I assumed direction of critical care for this patient from another provider in my specialty: no       Medications Ordered in ED Medications  pantoprazole (PROTONIX) injection 40 mg (40 mg Intravenous Given 07/15/23 1223)  lactated ringers bolus 1,000 mL (1,000 mLs Intravenous New Bag/Given 07/15/23 1225)    ED Course/ Medical Decision Making/ A&P                                 Medical Decision Making Amount and/or Complexity of Data Reviewed Labs: ordered. Radiology: ordered.  Risk Prescription drug management.   [name] 83 y.o. presented today for GIB. Working DDx that I considered at this time includes, but not limited to, Esophagitis, Mallory Weiss/Boerhaave, Variceal bleeding, PUD/gastritis/ulcers, diverticular bleed, colon cancer, rectal bleed, internal/external hemorrhoids  R/o DDx: pending  Review of prior external notes: 06/30/2023 discharge summary  Unique Tests and My Independent Interpretation:  CBC: Unremarkable I-STAT Chem-8: Mild hypokalemia 3.3 EKG: Sinus tachycardia 124, no signs of right heart strain, no ST elevations or depressions CMP: Unremarkable Lipase: Unremarkable Type and screen: O+ Fecal occult: Positive Lactic acid: Negative CT head without contrast: No acute findings CT abdomen pelvis with contrast: Pending  Social Determinants of Health: none  Discussion with  Independent Historian: Daughter  Discussion of Management of Tests: None  Risk: High: hospitalization or escalation of hospital-level care  Risk Stratification Score: none  Staffed with [place], MD  Plan: On exam patient was in acute distress and noted to be hypotensive and tachycardic.  Patient had no abdominal tenderness or peritoneal signs but does appear pale on exam.  Patient is neurologically intact and unsure as to why she initially came in for concerns of TIA however patient states that she is more concerned about her left lower quadrant belly pain.  Patient does have significant abdominal history and was recently hospitalized for ileus and patient states they are in talks for a colon resection.  Patient had a bowel movement earlier today.  Will obtain labs and give fluids and get imaging as well.  Attending did rectal exam and says there is yellow stool.  Occult was positive.  Hemoglobin is stable at this time.  The rest of labs are reassuring however lactic acid did come back at 3.1 so we will give a little bit more fluid.  Still waiting on imaging but do feel patient will need to be admitted.  Patient signed out to [facility], PA-C.  Please review their note for the continuation of patient's care.  The plan at this point is follow-up on imaging and if there is a surgical issue reach out to general surgery.  At the end of patient's last visit 8.  She was having chronic inflammation from her diverticulitis which could be contributing to her symptoms now.  Patient did have bright red blood per rectum at that time as well.  This chart was dictated using voice recognition software.  Despite best efforts  to proofread,  errors can occur which can change the documentation meaning.         Final Clinical Impression(s) / ED Diagnoses Final diagnoses:  None    Rx / DC Orders ED Discharge Orders     None         Remi Deter 07/15/23 1517    Jacalyn Lefevre,  MD 07/15/23 [phone]

## 2023-07-15 NOTE — ED Provider Notes (Signed)
 83 year old multiple medical comorbidities here for evaluation of weakness, left lower quadrant pain, diarrhea and dark stool.patient with multiple recent admissions including ICU admission on Levophed from septic shock from UTI found to have RSV as well as influenza 1 month ago.  Had recent admission a few weeks ago at [facility] for ileus, colitis. Occult positive here, negative on prior admission, Dc 5 days ago. Got IVF, hypotension improved. Still tachy, elevated lactic acid, pending CT. Will need admission. Physical Exam  BP 116/62   Pulse (!) 117   Temp 99.6 F (37.6 C) (Oral)   Resp 15   SpO2 100%   Physical Exam Vitals and nursing note reviewed.  Constitutional:      General: She is not in acute distress.    Appearance: She is well-developed. She is ill-appearing and toxic-appearing.  HENT:     Head: Atraumatic.  Eyes:     Pupils: Pupils are equal, round, and reactive to light.  Cardiovascular:     Rate and Rhythm: Tachycardia present.     Pulses: Normal pulses.     Heart sounds: Normal heart sounds.  Pulmonary:     Effort: No respiratory distress.     Comments: Coarse lung sounds at bases bilaterally.  Speaks in full sentences without difficulty Abdominal:     General: There is no distension.     Palpations: Abdomen is soft.     Tenderness: There is abdominal tenderness. There is left CVA tenderness.     Comments: Diffuse tenderness to abdomen, worse to suprapubic and left lower quadrant  Musculoskeletal:        General: No deformity or signs of injury. Normal range of motion.     Cervical back: Normal range of motion.     Right lower leg: No edema.     Left lower leg: No edema.  Skin:    General: Skin is warm and dry.     Capillary Refill: Capillary refill takes less than 2 seconds.  Neurological:     General: No focal deficit present.     Mental Status: She is alert.  Psychiatric:        Mood and Affect: Mood normal.     Procedures  .Critical Care  Performed  by: Linwood Dibbles, PA-C Authorized by: Linwood Dibbles, PA-C   Critical care provider statement:    Critical care time (minutes):  35   Critical care was necessary to treat or prevent imminent or life-threatening deterioration of the following conditions:  Sepsis and dehydration   Critical care was time spent personally by me on the following activities:  Development of treatment plan with patient or surrogate, discussions with consultants, evaluation of patient's response to treatment, examination of patient, ordering and review of laboratory studies, ordering and review of radiographic studies, ordering and performing treatments and interventions, pulse oximetry, re-evaluation of patient's condition and review of old charts  Labs Reviewed  CBC WITH DIFFERENTIAL/PLATELET - Abnormal; Notable for the following components:      Result Value   WBC 11.9 (*)    Hemoglobin 11.8 (*)    RDW 16.6 (*)    Lymphs Abs 6.3 (*)    Monocytes Absolute 1.4 (*)    All other components within normal limits  COMPREHENSIVE METABOLIC PANEL - Abnormal; Notable for the following components:   Potassium 3.4 (*)    CO2 21 (*)    Glucose, Bld 100 (*)    Creatinine, Ser 1.09 (*)    Calcium 8.3 (*)  Total Protein 5.2 (*)    Albumin 2.5 (*)    GFR, Estimated 51 (*)    All other components within normal limits  PROTIME-INR - Abnormal; Notable for the following components:   Prothrombin Time 15.8 (*)    All other components within normal limits  URINALYSIS, W/ REFLEX TO CULTURE (INFECTION SUSPECTED) - Abnormal; Notable for the following components:   APPearance HAZY (*)    Specific Gravity, Urine >1.046 (*)    Ketones, ur 5 (*)    Bacteria, UA RARE (*)    All other components within normal limits  POC OCCULT BLOOD, ED - Abnormal; Notable for the following components:   Fecal Occult Bld POSITIVE (*)    All other components within normal limits  I-STAT CHEM 8, ED - Abnormal; Notable for the following  components:   Potassium 3.3 (*)    Creatinine, Ser 1.10 (*)    Glucose, Bld 104 (*)    Calcium, Ion 0.94 (*)    All other components within normal limits  I-STAT CG4 LACTIC ACID, ED - Abnormal; Notable for the following components:   Lactic Acid, Venous 3.1 (*)    All other components within normal limits  RESP PANEL BY RT-PCR (RSV, FLU A&B, COVID)  RVPGX2  CULTURE, BLOOD (ROUTINE X 2)  CULTURE, BLOOD (ROUTINE X 2)  LIPASE, BLOOD  APTT  CBC  CBC  COMPREHENSIVE METABOLIC PANEL  MAGNESIUM  I-STAT CG4 LACTIC ACID, ED  I-STAT CG4 LACTIC ACID, ED  I-STAT CG4 LACTIC ACID, ED  TYPE AND SCREEN   DG Chest Port 1 View Result Date: 07/15/2023 CLINICAL DATA:  Weakness. EXAM: PORTABLE CHEST 1 VIEW COMPARISON:  Chest radiograph dated 07/01/2023. FINDINGS: The heart size and mediastinal contours are within normal limits. Streaky bibasilar opacities, slightly increased at the right lung base, with background chronic interstitial changes. No sizable pleural effusion or pneumothorax. No acute osseous abnormality. IMPRESSION: Streaky bibasilar opacities, slightly increased on the right, could reflect atelectasis or infiltrate superimposed on chronic interstitial changes. Electronically Signed   By: Hart Robinsons M.D.   On: 07/15/2023 16:36   CT ABDOMEN PELVIS W CONTRAST Result Date: 07/15/2023 CLINICAL DATA:  Left lower quadrant abdominal pain. EXAM: CT ABDOMEN AND PELVIS WITH CONTRAST TECHNIQUE: Multidetector CT imaging of the abdomen and pelvis was performed using the standard protocol following bolus administration of intravenous contrast. RADIATION DOSE REDUCTION: This exam was performed according to the departmental dose-optimization program which includes automated exposure control, adjustment of the mA and/or kV according to patient size and/or use of iterative reconstruction technique. CONTRAST:  75mL OMNIPAQUE IOHEXOL 350 MG/ML SOLN COMPARISON:  July 05, 2023. FINDINGS: Lower chest: Stable  probable bibasilar scarring is noted. Hepatobiliary: No focal liver abnormality is seen. No gallstones, gallbladder wall thickening, or biliary dilatation. Pancreas: Unremarkable. No pancreatic ductal dilatation or surrounding inflammatory changes. Spleen: Normal in size without focal abnormality. Adrenals/Urinary Tract: Adrenal glands appear normal. Stable bilateral renal cysts are noted for which no further follow-up is required. Minimal nonobstructive right nephrolithiasis is noted. No hydronephrosis or renal obstruction is noted. Urinary bladder is decompressed. Stomach/Bowel: The stomach is unremarkable. No small bowel dilatation is noted. Stable colonic dilatation is noted. Mild to moderate wall thickening of proximal transverse colon is noted which may be due to lack of distension, but inflammatory or infectious colitis cannot be excluded. The appendix is not visualized. There is again noted abrupt caliber change involving the sigmoid colon which was present on prior exam, stricture cannot be excluded.  Vascular/Lymphatic: Aortic atherosclerosis. No enlarged abdominal or pelvic lymph nodes. Reproductive: Status post hysterectomy. No adnexal masses. Other: No ascites or hernia is noted. Musculoskeletal: No acute osseous abnormality is noted. IMPRESSION: Stable probable bibasilar scarring is noted in the visualized lung bases. Minimal nonobstructive right nephrolithiasis. Continued dilatation of the colon is noted in most portions, most consistent with ileus. Mild to moderate wall thickening of proximal transverse colon is noted which may be due to lack of distension, but inflammatory or infectious colitis cannot be excluded. There is again noted abrupt caliber change involving the sigmoid colon which was present on prior exam, and stricture cannot be excluded. Aortic Atherosclerosis (ICD10-I70.0). Electronically Signed   By: Lupita Raider M.D.   On: 07/15/2023 15:36   CT Head Wo Contrast Result Date:  07/15/2023 CLINICAL DATA:  Provided history: Weakness. EXAM: CT HEAD WITHOUT CONTRAST TECHNIQUE: Contiguous axial images were obtained from the base of the skull through the vertex without intravenous contrast. RADIATION DOSE REDUCTION: This exam was performed according to the departmental dose-optimization program which includes automated exposure control, adjustment of the mA and/or kV according to patient size and/or use of iterative reconstruction technique. COMPARISON:  Head CT 06/13/2023. FINDINGS: Brain: Cerebral atrophy. Patchy and ill-defined hypoattenuation within the cerebral white matter, nonspecific but compatible with moderate chronic small vessel ischemic disease. There is no acute intracranial hemorrhage. No demarcated cortical infarct. No extra-axial fluid collection. No evidence of an intracranial mass. No midline shift. Vascular: No hyperdense vessel. Atherosclerotic calcifications. Skull: No calvarial fracture or aggressive osseous lesion. Sinuses/Orbits: No mass or acute finding within the imaged orbits. Minimal mucosal thickening within the bilateral ethmoid and right maxillary sinuses. Other: Small-volume fluid within left mastoid air cells. IMPRESSION: 1. No evidence of an acute intracranial abnormality. 2. Moderate chronic small vessel ischemic changes within the cerebral white matter. 3. Cerebral atrophy. 4. Small left mastoid effusion. Electronically Signed   By: Jackey Loge D.O.   On: 07/15/2023 14:46   CT ABDOMEN PELVIS W CONTRAST Result Date: 07/05/2023 CLINICAL DATA:  Right lower quadrant abdominal pain. EXAM: CT ABDOMEN AND PELVIS WITH CONTRAST TECHNIQUE: Multidetector CT imaging of the abdomen and pelvis was performed using the standard protocol following bolus administration of intravenous contrast. RADIATION DOSE REDUCTION: This exam was performed according to the departmental dose-optimization program which includes automated exposure control, adjustment of the mA and/or kV  according to patient size and/or use of iterative reconstruction technique. CONTRAST:  OMNIPAQUE IOHEXOL 300 MG/ML  SOLN COMPARISON:  CT abdomen pelvis dated 06/30/2023. FINDINGS: Lower chest: There is bilateral bronchiectasis and reticulation. Hepatobiliary: No focal liver abnormality is seen. Layering density in the gallbladder may reflect sludge, small gallstones, or vicarious excretion of contrast. No gallbladder wall thickening or biliary dilatation. Pancreas: Unremarkable. No pancreatic ductal dilatation or surrounding inflammatory changes. Spleen: Normal in size without focal abnormality. Adrenals/Urinary Tract: Adrenal glands are unremarkable. Nonobstructive right renal calculi measure up to 4 mm in size. Bilateral renal cysts measure up to 6 cm on the right. No imaging follow-up is recommended for this finding. No hydronephrosis on either side. Bladder is unremarkable. Stomach/Bowel: Stomach is within normal limits. There is wall thickening of the transverse, descending, and proximal sigmoid colon, similar to prior exam. No significant surrounding inflammatory changes. There is caliber change to decompressed distal sigmoid colon in the pelvis which appears more pronounced than on prior exam. The sigmoid colon is in close proximity to the left ovary and the bladder and there appears to be  some degree of tethering. The appendix is not identified and may be surgically absent. Vascular/Lymphatic: Aortic atherosclerosis. No enlarged abdominal or pelvic lymph nodes. Reproductive: Status post hysterectomy. Other: No abdominal wall hernia or abnormality. No abdominopelvic ascites. Musculoskeletal: Severe degenerative changes are seen in the spine. Fixation hardware is seen in the proximal left femur. IMPRESSION: 1. Wall thickening of the transverse, descending, and proximal sigmoid colon, similar to prior exam. There is caliber change to decompressed distal sigmoid colon in the pelvis which appears more  pronounced than on prior exam. There is apparent tethering of the sigmoid colon to the bladder and ovary which may result in some degree of constriction of the sigmoid colon. Aortic Atherosclerosis (ICD10-I70.0). Electronically Signed   By: Romona Curls M.D.   On: 07/05/2023 17:57   DG Chest 2 View Result Date: 07/01/2023 CLINICAL DATA:  [id number] Sepsis [facility]) [id number] EXAM: CHEST - 2 VIEW COMPARISON:  06/17/2023, 10/30/2021 FINDINGS: The heart size and mediastinal contours are within normal limits. Low lung volumes. Streaky bibasilar opacities, similar in appearance to the previous study. Upper lung zones are clear. No pleural effusion or pneumothorax. The visualized skeletal structures are unremarkable. IMPRESSION: Low lung volumes with streaky bibasilar opacities, similar in appearance to the previous study. Findings may represent atelectasis versus pneumonia. Electronically Signed   By: Duanne Guess D.O.   On: 07/01/2023 11:20   CT ABDOMEN PELVIS W CONTRAST Result Date: 06/30/2023 CLINICAL DATA:  Abdomen pain left lower quadrant pain EXAM: CT ABDOMEN AND PELVIS WITH CONTRAST TECHNIQUE: Multidetector CT imaging of the abdomen and pelvis was performed using the standard protocol following bolus administration of intravenous contrast. RADIATION DOSE REDUCTION: This exam was performed according to the departmental dose-optimization program which includes automated exposure control, adjustment of the mA and/or kV according to patient size and/or use of iterative reconstruction technique. CONTRAST:  OMNIPAQUE IOHEXOL 300 MG/ML  SOLN COMPARISON:  Radiograph 06/26/2023, CT 12/15/2022, 03/01/2022 FINDINGS: Lower chest: Lung bases demonstrate bronchial wall thickening and mild mucous plugging in the right lower lobe. Peribronchovascular hazy density could be inflammatory. Hepatobiliary: No focal liver abnormality is seen. No gallstones, gallbladder wall thickening, or biliary dilatation. Pancreas:  Unremarkable. No pancreatic ductal dilatation or surrounding inflammatory changes. Spleen: Normal in size without focal abnormality. Adrenals/Urinary Tract: Adrenal glands are within normal limits. Kidneys show no hydronephrosis. Multiple renal cysts for which no imaging follow-up is recommended. Small nonobstructing right kidney stones. Decompressed urinary bladder with diffuse wall thickening Stomach/Bowel: Stomach nonenlarged. Thickened small bowel loops. In the right lower quadrant with mucosal enhancement. Fluid within the colon. Air distension of cecum. Wall thickening and mucosal enhancement of the: With some inflammation surrounding the descending colon. Caliber change of the colon in the pelvis in the region of the thickened bladder and left ovary is seen on prior exams, series 2, image 69 through 74. No frank gas within the urinary bladder at this time. Vascular/Lymphatic: Aortic atherosclerosis. No enlarged abdominal or pelvic lymph nodes. Reproductive: Hysterectomy. Left ovary tethered to a loop of sigmoid colon and bladder. Other: Negative for pelvic effusion or free air. Musculoskeletal: Multilevel degenerative changes. No acute osseous abnormality IMPRESSION: 1. Wall thickening and mucosal enhancement of small bowel in the right lower quadrant suspicious for enteritis. Mild air distension of the colon with areas of wall thickening and mucosal enhancement consistent with colitis. Caliber change in the pelvis at the level of the distal sigmoid colon where there is tethered appearance of the sigmoid colon to a thickened bladder and  the left ovary as seen on prior exams. Findings raise concern for possible colon stricture and mild upstream obstruction. Concern previously raised for colovesical fistula, no gas in the bladder on today's study. 2. Nonobstructing right kidney stones. 3. Bronchial wall thickening and mild mucous plugging in the right lower lobe with peribronchovascular hazy density which could  be inflammatory. 4. Aortic atherosclerosis. Aortic Atherosclerosis (ICD10-I70.0). Electronically Signed   By: Jasmine Pang M.D.   On: 06/30/2023 19:19   DG Abd Portable 1V Result Date: 06/26/2023 CLINICAL DATA:  Follow-up ileus EXAM: PORTABLE ABDOMEN - 1 VIEW COMPARISON:  06/24/2023 FINDINGS: Scattered large and small bowel gas is noted. The degree of colonic distention has improved when compared with the prior exam. No free air is seen. No abnormal mass is noted. Postsurgical changes in the left hip are again seen. IMPRESSION: Decrease in the degree of gaseous distension of the colon. No free air is seen. Electronically Signed   By: Alcide Clever M.D.   On: 06/26/2023 11:33   DG Abd 1 View Result Date: 06/24/2023 CLINICAL DATA:  Ileus. EXAM: ABDOMEN - 1 VIEW COMPARISON:  Abdominal radiograph dated 06/23/2023. FINDINGS: Dilated small and large bowel as seen on the prior radiograph. No free air identified. Degenerative changes of the spine. No acute osseous pathology. IMPRESSION: Persistent air distention of the small and large bowel. Electronically Signed   By: Elgie Collard M.D.   On: 06/24/2023 14:18   DG Abd Portable 1V Result Date: 06/23/2023 CLINICAL DATA:  83 year old female is constipated. EXAM: PORTABLE ABDOMEN - 1 VIEW COMPARISON:  06/22/2023 radiographs and earlier. FINDINGS: Portable AP supine view at 0752 hours. Gas Phil large and small bowel loops throughout the abdomen and pelvis redemonstrated, similar to multiple abdominal radiographs since 06/17/2023. No significant retained stool. Lung bases appear negative. Stable visualized osseous structures. Previous left femur ORIF. IMPRESSION: Ongoing gas distended small and large bowel loops. Differential considerations are ileus versus distal colonic obstruction (less likely given rectal gas on 06/20/2023 comparison. No significant retained stool. Electronically Signed   By: Odessa Fleming M.D.   On: 06/23/2023 08:45   DG Abd Portable 1V Result Date:  06/22/2023 CLINICAL DATA:  Constipation EXAM: PORTABLE ABDOMEN - 1 VIEW COMPARISON:  Two days ago FINDINGS: Generalized gaseous distension of colon and to a lesser extent affecting small bowel. No abnormal stool retention. No concerning mass effect or gas collection. The largest segment of gas dilated bowel is measured at the cecum, 11 cm and previously 9.4 cm. No clear volvulus pattern. IMPRESSION: Unchanged pattern of generalized gaseous distension of bowel. Greatest distension at the cecum where there is 11 cm in diameter, mildly progressed. Provided history of constipation, which is not seen radiographically. Electronically Signed   By: Tiburcio Pea M.D.   On: 06/22/2023 10:37   DG Abd 1 View Result Date: 06/20/2023 CLINICAL DATA:  Abdominal pain EXAM: ABDOMEN - 1 VIEW COMPARISON:  Two days ago FINDINGS: Diffuse gaseous distension of colon measuring up to 9.5 cm in diameter proximally. No abnormal stool retention or visible pneumatosis. No evidence of small-bowel obstruction. Reticulation at the lung bases is stable. IMPRESSION: Unchanged diffuse gaseous distension of the colon suggesting adynamic ileus. Electronically Signed   By: Tiburcio Pea M.D.   On: 06/20/2023 06:36   DG Abd Portable 1V Result Date: 06/17/2023 CLINICAL DATA:  Shortness of breath and abdominal pain EXAM: PORTABLE ABDOMEN - 1 VIEW COMPARISON:  12/15/2022 FINDINGS: Diffuse gaseous distension of colon measuring at least 7.5 cm  at the distal transverse segment, proximal colon possibly measuring up to 10 cm in diameter. No evidence of pneumatosis or small bowel obstruction. No concerning intra-abdominal mass effect or calcification. IMPRESSION: Gas distended colon, usually ileus. Electronically Signed   By: Tiburcio Pea M.D.   On: 06/17/2023 07:00   DG Chest Port 1 View Result Date: 06/17/2023 CLINICAL DATA:  Shortness of breath EXAM: PORTABLE CHEST 1 VIEW COMPARISON:  Four days ago FINDINGS: Indistinct density in the lower lungs  which is stable. There is chronic lung disease by prior chest CT at the bases. Heart size is stable when allowing for significant distortion from rotation. Trace pleural fluid is possible on the right. No pneumothorax. IMPRESSION: Stable bilateral pulmonary opacity at least partially related to chronic lung disease by prior chest CT, with possible superimposed pneumonia. Electronically Signed   By: Tiburcio Pea M.D.   On: 06/17/2023 06:58    ED Course / MDM   Clinical Course as of 07/15/23 1934  Wed Jul 15, 2023  1737 Repeat lactic 1.2 [BH]    Clinical Course User Index [BH] Sofhia Ulibarri A, PA-C   83 year old multiple medical comorbidities here for evaluation of weakness, left lower quadrant pain, diarrhea and dark stool.patient with multiple recent admissions including ICU admission on Levophed from septic shock from UTI found to have RSV as well as influenza 1 month ago.  Had recent admission a few weeks ago at [facility] for ileus, colitis. Occult positive here, negative on prior admission, Dc 5 days ago. Got IVF, hypotension improved. Still tachy, elevated lactic acid, pending CT. Will need admission.  Patient assessed at bedside.  Meet sepsis criteria given hypotension, tachycardia, had a temp of 100.0 as well as lactic acidosis and leukocytosis.  I added broad-spectrum antibiotics as well as remaining sepsis workup.  CT scan shows possible infectious versus inflammatory colitis as well as transition point possible stricture.  BP improved with IV fluids.  She received a 30/cc/kg bolus by previous provider.  She is still persistently tachycardic, reviewed EKG shows sinus tachycardia.  Labs and imaging personally viewed interpreted  Lactic 1.6--3.1--1.2 UA negative for infection Viral panel negative Occult positive Lipase 20 Metabolic panel creatinine 1.09 CBC leukocytosis 11.9 CT abdomen pelvis shows possible infectious versus inflammatory colitis with transition point Chest  x-ray shows possible pneumonia CT head without acute abnormality EKG sinus tachycardia  Patient reassessed.  Discussed plan at bedside.  Will admit for further management and workup.  Previous provider spoke with patient and family.  Patient is a full code.  Discussed with Dr. Alinda Money with medicine, will admit for workup and management  Messaged [facility] Dr. Doy Hutching via secure chat.  Patient reassessed, hemodynamically stable.  Was given sepsis IV fluids, broad-spectrum antibiotics.  Admitted for further management and workup.  Low suspicion for ischemic colitis as cause of her CT findings  The patient appears reasonably stabilized for admission considering the current resources, flow, and capabilities available in the ED at this time, and I doubt any other [facility] requiring further screening and/or treatment in the ED prior to admission.    Medical Decision Making Amount and/or Complexity of Data Reviewed External Data Reviewed: labs, radiology, ECG and notes. Labs: ordered. Decision-making details documented in ED Course. Radiology: ordered and independent interpretation performed. Decision-making details documented in ED Course. ECG/medicine tests: ordered and independent interpretation performed. Decision-making details documented in ED Course.  Risk OTC drugs. Prescription drug management. Parenteral controlled substances. Decision regarding hospitalization. Diagnosis or treatment significantly limited  by social determinants of health.         Aleysha Meckler A, PA-C 07/15/23 1934    Alvira Monday, MD 07/16/23 [id number]

## 2023-07-16 ENCOUNTER — Encounter (HOSPITAL_COMMUNITY): Payer: Self-pay | Admitting: Internal Medicine

## 2023-07-16 ENCOUNTER — Other Ambulatory Visit: Payer: Self-pay

## 2023-07-16 DIAGNOSIS — K219 Gastro-esophageal reflux disease without esophagitis: Secondary | ICD-10-CM | POA: Diagnosis not present

## 2023-07-16 DIAGNOSIS — J9611 Chronic respiratory failure with hypoxia: Secondary | ICD-10-CM | POA: Diagnosis present

## 2023-07-16 DIAGNOSIS — I509 Heart failure, unspecified: Secondary | ICD-10-CM | POA: Diagnosis not present

## 2023-07-16 DIAGNOSIS — Z9981 Dependence on supplemental oxygen: Secondary | ICD-10-CM | POA: Diagnosis not present

## 2023-07-16 DIAGNOSIS — E039 Hypothyroidism, unspecified: Secondary | ICD-10-CM | POA: Diagnosis present

## 2023-07-16 DIAGNOSIS — F411 Generalized anxiety disorder: Secondary | ICD-10-CM | POA: Diagnosis not present

## 2023-07-16 DIAGNOSIS — F32A Depression, unspecified: Secondary | ICD-10-CM | POA: Diagnosis present

## 2023-07-16 DIAGNOSIS — R1032 Left lower quadrant pain: Secondary | ICD-10-CM | POA: Diagnosis not present

## 2023-07-16 DIAGNOSIS — E785 Hyperlipidemia, unspecified: Secondary | ICD-10-CM | POA: Diagnosis not present

## 2023-07-16 DIAGNOSIS — K56699 Other intestinal obstruction unspecified as to partial versus complete obstruction: Secondary | ICD-10-CM

## 2023-07-16 DIAGNOSIS — K5669 Other partial intestinal obstruction: Secondary | ICD-10-CM | POA: Diagnosis not present

## 2023-07-16 DIAGNOSIS — I2510 Atherosclerotic heart disease of native coronary artery without angina pectoris: Secondary | ICD-10-CM | POA: Diagnosis not present

## 2023-07-16 DIAGNOSIS — J8410 Pulmonary fibrosis, unspecified: Secondary | ICD-10-CM | POA: Diagnosis present

## 2023-07-16 DIAGNOSIS — I10 Essential (primary) hypertension: Secondary | ICD-10-CM | POA: Diagnosis not present

## 2023-07-16 DIAGNOSIS — Z1152 Encounter for screening for COVID-19: Secondary | ICD-10-CM | POA: Diagnosis not present

## 2023-07-16 DIAGNOSIS — E1122 Type 2 diabetes mellitus with diabetic chronic kidney disease: Secondary | ICD-10-CM | POA: Diagnosis present

## 2023-07-16 DIAGNOSIS — K56609 Unspecified intestinal obstruction, unspecified as to partial versus complete obstruction: Secondary | ICD-10-CM | POA: Diagnosis not present

## 2023-07-16 DIAGNOSIS — D500 Iron deficiency anemia secondary to blood loss (chronic): Secondary | ICD-10-CM | POA: Diagnosis present

## 2023-07-16 DIAGNOSIS — Z933 Colostomy status: Secondary | ICD-10-CM | POA: Diagnosis not present

## 2023-07-16 DIAGNOSIS — I471 Supraventricular tachycardia, unspecified: Secondary | ICD-10-CM | POA: Diagnosis present

## 2023-07-16 DIAGNOSIS — D509 Iron deficiency anemia, unspecified: Secondary | ICD-10-CM | POA: Diagnosis present

## 2023-07-16 DIAGNOSIS — G3184 Mild cognitive impairment, so stated: Secondary | ICD-10-CM | POA: Diagnosis present

## 2023-07-16 DIAGNOSIS — E876 Hypokalemia: Secondary | ICD-10-CM | POA: Diagnosis present

## 2023-07-16 DIAGNOSIS — A419 Sepsis, unspecified organism: Secondary | ICD-10-CM | POA: Diagnosis present

## 2023-07-16 DIAGNOSIS — J4489 Other specified chronic obstructive pulmonary disease: Secondary | ICD-10-CM | POA: Diagnosis present

## 2023-07-16 DIAGNOSIS — R6521 Severe sepsis with septic shock: Secondary | ICD-10-CM | POA: Diagnosis present

## 2023-07-16 DIAGNOSIS — K6389 Other specified diseases of intestine: Secondary | ICD-10-CM | POA: Diagnosis not present

## 2023-07-16 DIAGNOSIS — R000 Tachycardia, unspecified: Secondary | ICD-10-CM | POA: Diagnosis not present

## 2023-07-16 DIAGNOSIS — E2740 Unspecified adrenocortical insufficiency: Secondary | ICD-10-CM | POA: Diagnosis present

## 2023-07-16 DIAGNOSIS — R109 Unspecified abdominal pain: Secondary | ICD-10-CM | POA: Diagnosis present

## 2023-07-16 DIAGNOSIS — I130 Hypertensive heart and chronic kidney disease with heart failure and stage 1 through stage 4 chronic kidney disease, or unspecified chronic kidney disease: Secondary | ICD-10-CM | POA: Diagnosis present

## 2023-07-16 DIAGNOSIS — J479 Bronchiectasis, uncomplicated: Secondary | ICD-10-CM | POA: Diagnosis present

## 2023-07-16 DIAGNOSIS — N179 Acute kidney failure, unspecified: Secondary | ICD-10-CM | POA: Diagnosis present

## 2023-07-16 DIAGNOSIS — J849 Interstitial pulmonary disease, unspecified: Secondary | ICD-10-CM | POA: Diagnosis not present

## 2023-07-16 DIAGNOSIS — D849 Immunodeficiency, unspecified: Secondary | ICD-10-CM | POA: Diagnosis present

## 2023-07-16 DIAGNOSIS — F0393 Unspecified dementia, unspecified severity, with mood disturbance: Secondary | ICD-10-CM | POA: Diagnosis present

## 2023-07-16 DIAGNOSIS — Z6835 Body mass index (BMI) 35.0-35.9, adult: Secondary | ICD-10-CM | POA: Diagnosis not present

## 2023-07-16 DIAGNOSIS — F0394 Unspecified dementia, unspecified severity, with anxiety: Secondary | ICD-10-CM | POA: Diagnosis not present

## 2023-07-16 DIAGNOSIS — Z794 Long term (current) use of insulin: Secondary | ICD-10-CM | POA: Diagnosis not present

## 2023-07-16 DIAGNOSIS — M0510 Rheumatoid lung disease with rheumatoid arthritis of unspecified site: Secondary | ICD-10-CM | POA: Diagnosis present

## 2023-07-16 DIAGNOSIS — E1142 Type 2 diabetes mellitus with diabetic polyneuropathy: Secondary | ICD-10-CM | POA: Diagnosis present

## 2023-07-16 DIAGNOSIS — W19XXXA Unspecified fall, initial encounter: Secondary | ICD-10-CM | POA: Diagnosis present

## 2023-07-16 DIAGNOSIS — K921 Melena: Secondary | ICD-10-CM | POA: Diagnosis not present

## 2023-07-16 DIAGNOSIS — N183 Chronic kidney disease, stage 3 unspecified: Secondary | ICD-10-CM | POA: Diagnosis present

## 2023-07-16 DIAGNOSIS — R5381 Other malaise: Secondary | ICD-10-CM | POA: Diagnosis not present

## 2023-07-16 DIAGNOSIS — J9610 Chronic respiratory failure, unspecified whether with hypoxia or hypercapnia: Secondary | ICD-10-CM | POA: Diagnosis present

## 2023-07-16 DIAGNOSIS — E872 Acidosis, unspecified: Secondary | ICD-10-CM | POA: Diagnosis present

## 2023-07-16 DIAGNOSIS — I5032 Chronic diastolic (congestive) heart failure: Secondary | ICD-10-CM | POA: Diagnosis present

## 2023-07-16 DIAGNOSIS — M069 Rheumatoid arthritis, unspecified: Secondary | ICD-10-CM | POA: Diagnosis not present

## 2023-07-16 DIAGNOSIS — E7800 Pure hypercholesterolemia, unspecified: Secondary | ICD-10-CM | POA: Diagnosis present

## 2023-07-16 DIAGNOSIS — I110 Hypertensive heart disease with heart failure: Secondary | ICD-10-CM | POA: Diagnosis present

## 2023-07-16 DIAGNOSIS — K529 Noninfective gastroenteritis and colitis, unspecified: Secondary | ICD-10-CM | POA: Diagnosis not present

## 2023-07-16 LAB — COMPREHENSIVE METABOLIC PANEL
ALT: 11 U/L (ref 0–44)
AST: 15 U/L (ref 15–41)
Albumin: 2.1 g/dL — ABNORMAL LOW (ref 3.5–5.0)
Alkaline Phosphatase: 37 U/L — ABNORMAL LOW (ref 38–126)
Anion gap: 11 (ref 5–15)
BUN: 15 mg/dL (ref 8–23)
CO2: 21 mmol/L — ABNORMAL LOW (ref 22–32)
Calcium: 8 mg/dL — ABNORMAL LOW (ref 8.9–10.3)
Chloride: 108 mmol/L (ref 98–111)
Creatinine, Ser: 0.88 mg/dL (ref 0.44–1.00)
GFR, Estimated: >60 mL/min (ref >60)
Glucose, Bld: 96 mg/dL (ref 70–99)
Potassium: 3.8 mmol/L (ref 3.5–5.1)
Sodium: 140 mmol/L (ref 135–145)
Total Bilirubin: 0.7 mg/dL (ref 0.0–1.2)
Total Protein: 4.5 g/dL — ABNORMAL LOW (ref 6.5–8.1)

## 2023-07-16 LAB — CBC
HCT: 36.7 % (ref 36.0–46.0)
HCT: 37.2 % (ref 36.0–46.0)
Hemoglobin: 11.1 g/dL — ABNORMAL LOW (ref 12.0–15.0)
Hemoglobin: 11.1 g/dL — ABNORMAL LOW (ref 12.0–15.0)
MCH: 29.9 pg (ref 26.0–34.0)
MCH: 30.1 pg (ref 26.0–34.0)
MCHC: 29.8 g/dL — ABNORMAL LOW (ref 30.0–36.0)
MCHC: 30.2 g/dL (ref 30.0–36.0)
MCV: 100.3 fL — ABNORMAL HIGH (ref 80.0–100.0)
MCV: 99.5 fL (ref 80.0–100.0)
Platelets: 196 10*3/uL (ref 150–400)
Platelets: 200 10*3/uL (ref 150–400)
RBC: 3.69 MIL/uL — ABNORMAL LOW (ref 3.87–5.11)
RBC: 3.71 MIL/uL — ABNORMAL LOW (ref 3.87–5.11)
RDW: 16.5 % — ABNORMAL HIGH (ref 11.5–15.5)
RDW: 16.5 % — ABNORMAL HIGH (ref 11.5–15.5)
WBC: 12.7 10*3/uL — ABNORMAL HIGH (ref 4.0–10.5)
WBC: 13.6 10*3/uL — ABNORMAL HIGH (ref 4.0–10.5)
nRBC: 0 % (ref 0.0–0.2)
nRBC: 0 % (ref 0.0–0.2)

## 2023-07-16 LAB — PHOSPHORUS: Phosphorus: 2.1 mg/dL — ABNORMAL LOW (ref 2.5–4.6)

## 2023-07-16 LAB — CBG MONITORING, ED: Glucose-Capillary: 99 mg/dL (ref 70–99)

## 2023-07-16 LAB — GLUCOSE, CAPILLARY
Glucose-Capillary: 85 mg/dL (ref 70–99)
Glucose-Capillary: 95 mg/dL (ref 70–99)

## 2023-07-16 LAB — LACTIC ACID, PLASMA: Lactic Acid, Venous: 1 mmol/L (ref 0.5–1.9)

## 2023-07-16 LAB — MAGNESIUM
Magnesium: 1.4 mg/dL — ABNORMAL LOW (ref 1.7–2.4)
Magnesium: 1.5 mg/dL — ABNORMAL LOW (ref 1.7–2.4)

## 2023-07-16 MED ORDER — MAGNESIUM SULFATE 4 GM/100ML IV SOLN
4.0000 g | Freq: Once | INTRAVENOUS | Status: DC
  Filled 2023-07-16: qty 100

## 2023-07-16 MED ORDER — LACTATED RINGERS IV SOLN: INTRAVENOUS | Status: AC

## 2023-07-16 MED ORDER — HYDROCORTISONE SOD SUC (PF) 100 MG IJ SOLR
100.0000 mg | Freq: Two times a day (BID) | INTRAMUSCULAR | Status: DC
  Administered 2023-07-16 – 2023-07-18 (×5): 100 mg via INTRAVENOUS
  Filled 2023-07-16 (×5): qty 2

## 2023-07-16 MED ORDER — MAGNESIUM SULFATE 2 GM/50ML IV SOLN
2.0000 g | Freq: Once | INTRAVENOUS | Status: AC
  Administered 2023-07-16: 2 g via INTRAVENOUS
  Filled 2023-07-16: qty 50

## 2023-07-16 MED ORDER — MIDODRINE HCL 5 MG PO TABS
5.0000 mg | ORAL_TABLET | Freq: Three times a day (TID) | ORAL | Status: DC
  Administered 2023-07-16 – 2023-07-18 (×5): 5 mg via ORAL
  Filled 2023-07-16 (×6): qty 1

## 2023-07-16 MED ORDER — LACTATED RINGERS IV BOLUS: 500.0000 mL | Freq: Once | INTRAVENOUS | Status: DC

## 2023-07-16 NOTE — Significant Event (Signed)
 Notified by RN earlier in shift of continued tachycardia up to 130s; no change made to abx which appeared appropriate on CTX / Flagyl. Started on mIVF since limited PO intake at 75 cc/hr. Avoiding additional bolus due to large volume fluid resuscitation 3L in the ED.   Later notified of worsening hypotension with BP 80/50s with MAP ~60. Remains tachycardic in 120s, RR in low 20s. Afebrile. On interview, c/o L sided abd pain. Ongoing diarrhea for past 4 weeks. On exam, regular and tachycardic, lungs with fine rales in the bases, abd with mild L hemiabd ttp without rebound, guarding, rigidity, trace peripheral edema.   A/p   Developing shock, likely mixed distributive (septic, ? AI) and hypovolemic  - Hold on additional bolus due to large volume resuscitation 3L in ED and currently on mIVF rate.  - Continue same antibiotics with Ceftriaxone and Flagyl for intraabdominal coverage.  - Trial of stress dose Hydrocortisone 100 mg IV q 12 hr, discontinued Prednisone while on Hydrocortisone which will need to be restarted once off stress dose steroids.  - Trial of Midodrine 5 mg TID to support BP  - If persistent hypotension despite above will need initiation of vasopressors and escalation to ICU level of care.   Discussed above with son at bedside, and bedside RN   Dolly Rias, MD  [facility]

## 2023-07-16 NOTE — ED Notes (Signed)
 Patient noted to be incontinent of stool. Patient cleaned and linens changed.

## 2023-07-16 NOTE — ED Notes (Signed)
 Pt vomiting after PO potassium, as patient's son reports this has happened before.Dr.Melvin secured chat and provided orders.

## 2023-07-16 NOTE — Progress Notes (Signed)
 PROGRESS NOTE    [name]  WUJ:[id number] DOB: 1940/08/22 DOA: 07/15/2023 PCP: [name], MD  Outpatient Specialists:     Brief Narrative:  Patient is an 83 year old female with past medical history significant for hypertension, hyperlipidemia, diabetes mellitus, neuro (, chronic kidney disease, ILD, diverticulosis, diastolic CHF, rheumatoid arthritis, GI bleed, DVT, PE, obesity and mild cognitive impairment.  Patient was admitted with melena and abdominal pain.  Patient is known to have sigmoid stricture.  Patient has been admitted twice recently with complications that likely related to the sigmoid stricture.  Hartman's procedure was considered at the point, but patient was thought to be very high risk.  Patient presents with worsening symptoms.  GI input is appreciated.  Surgical team has been consulted to consider diverting colostomy.  07/16/2023: Patient seen.  Not in any distress.  GI input is appreciated.  Awaiting surgical input.  No constitutional symptoms reported.  GI panel came back negative.  C. difficile came back negative.  Fecal occult blood was positive.  Assessment & Plan:   Principal Problem:   GI bleed Active Problems:   Dyslipidemia   Essential hypertension   Rheumatoid arthritis (HCC)   Inability to walk   ILD (interstitial lung disease) (HCC)   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)   Morbid obesity (HCC)   Chronic respiratory failure with hypoxia (HCC)   Chronic heart failure with preserved ejection fraction (HFpEF) (HCC)   Gout   Depression   Chronic bilateral low back pain without sciatica   Acquired hypothyroidism   DVT (deep venous thrombosis) (HCC)   Chronic pain   CRI (chronic renal insufficiency), stage 3 (moderate) (HCC)   Bronchiectasis without complication (HCC)   Diabetic neuropathy (HCC)   History of pulmonary embolus (PE)   [name]   GERD without esophagitis   Abdominal  pain   GI bleed Anemia -Fecal occult blood is positive. -History of sigmoid stricture. -Continue to monitor H/H. -Supportive care for now. -GI team has been consulted.      Abdominal pain Ileus ?Colitis Leukocytosis > Patient presenting with persistent left lower quadrant pain.  Imaging showed evidence of ileus and transverse colon wall thickening possibly secondary to decompression versus inflammation/infection. > Initially started on broad-spectrum antibiotics in the ED due to hypotension.  Initial lactic acid was normal repeat was elevated but and third was normal.  Suspect this was due to hemolysis. > Does have leukocytosis to 11.9 in the setting of immunosuppression's for rheumatoid arthritis and no other explanation for her leukocytosis. > Will continue with abdominal antibiotic coverage for now and put patient on bowel rest. - Monitoring on progressive as above - N.p.o. except sips with meds - Continue with ceftriaxone and azithromycin   AKI ?CKD (listed in chart baseline creatinine 0.7) Hypokalemia -AKI has resolved. -Likely prerenal. -Resolved with hydration. -Potassium has been repleted.  Potassium has improved from 3.3-3.8. -Repeat magnesium level. -Continue to monitor renal function and electrolytes.  Hypomagnesemia: -Repeat magnesium level of 1.4. -IV magnesium 4 g x 1 dose. -Will also check phosphorus level.   Bronchiectasis ILD Chronic respiratory failure with hypoxia > On chronic 3 L. - Holding leflunomide and Plaquenil while evaluating for infection - Continue as needed albuterol   Hypertension - Holding antihypertensives in the setting of presenting with hypotension 07/16/2023: Hypotension has resolved.  Monitor closely.   Hyperlipidemia - Continue home atorvastatin   Diabetes - SSI   Hypothyroidism - Continue Synthroid  GERD - On IV PPI as above   Gout - Her allopurinol colchicine are currently being held outpatient   Chronic  diastolic CHF -Last echo was in January with EF 60 to 65%, G1 DD, normal RV function. -Compensated. - Holding diuretics in the setting of presenting with hypotension   Neuropathy - Continue home gabapentin   Obesity - Noted   Rheumatoid arthritis - Holding leflunomide and Plaquenil as above - Can continue with prednisone   History of DVT and PE - Not on anticoagulation   Chronic pain - Continue scheduled long-acting morphine twice daily - Consider PRN breakthrough pain medication.  If needed.  Stenosis of the cecum: -Likely secondary to diverticular disease. -GI and surgery teams have been consulted. -Diversion colostomy has been broached.   DVT prophylaxis: SCD. Code Status: Full code. Family Communication:  Disposition Plan: Patient remains inpatient.  Multiple severe comorbidities.   Consultants:  GI. Surgery.  Procedures:  None.  Antimicrobials:  Patient is currently on IV Flagyl   Subjective: No new complaints.  Objective: Vitals:   07/16/23 0800 07/16/23 1030 07/16/23 1045 07/16/23 1205  BP: 131/78 137/88 (!) 160/98   Pulse: (!) 117 (!) 108 (!) 110   Resp: (!) 21 11 15    Temp:    99 F (37.2 C)  TempSrc:    Oral  SpO2: 100% 100% 100%     Intake/Output Summary (Last 24 hours) at 07/16/2023 1524 Last data filed at 07/16/2023 0446 Gross per 24 hour  Intake 3650 ml  Output --  Net 3650 ml   There were no vitals filed for this visit.  Examination:  General exam: Patient is morbidly obese.  Appears calm and comfortable  Respiratory system: Clear to auscultation.  Cardiovascular system: S1 & S2 hear Gastrointestinal system: Some gaseous distention of the abdomen.  Abdomen is obese, with left lower abdominal area tenderness. Central nervous system: Awake and alert.  Alert and oriented. No focal neurological deficits.     Data Reviewed: I have personally reviewed following labs and imaging studies  CBC: Recent Labs  Lab 07/15/23 1209  07/15/23 1215 07/15/23 2355 07/16/23 0337  WBC 11.9*  --  13.6* 12.7*  NEUTROABS 3.9  --   --   --   HGB 11.8* 12.9 11.1* 11.1*  HCT 38.7 38.0 37.2 36.7  MCV 99.7  --  100.3* 99.5  PLT 207  --  196 200   Basic Metabolic Panel: Recent Labs  Lab 07/15/23 1209 07/15/23 1215 07/15/23 2355 07/16/23 0337  NA 140 140  --  140  K 3.4* 3.3*  --  3.8  CL 105 106  --  108  CO2 21*  --   --  21*  GLUCOSE 100* 104*  --  96  BUN 17 18  --  15  CREATININE 1.09* 1.10*  --  0.88  CALCIUM 8.3*  --   --  8.0*  MG  --   --  1.5*  --    GFR: CrCl cannot be calculated (Unknown ideal weight.). Liver Function Tests: Recent Labs  Lab 07/15/23 1209 07/16/23 0337  AST 15 15  ALT 11 11  ALKPHOS 45 37*  BILITOT 0.6 0.7  PROT 5.2* 4.5*  ALBUMIN 2.5* 2.1*   Recent Labs  Lab 07/15/23 1209  LIPASE 20   No results for input(s): "AMMONIA" in the last 168 hours. Coagulation Profile: Recent Labs  Lab 07/15/23 1700  INR 1.2   Cardiac Enzymes: No results for input(s): "CKTOTAL", "CKMB", "  CKMBINDEX", "TROPONINI" in the last 168 hours. BNP (last 3 results) No results for input(s): "PROBNP" in the last 8760 hours. HbA1C: No results for input(s): "HGBA1C" in the last 72 hours. CBG: Recent Labs  Lab 07/16/23 0759  GLUCAP 99   Lipid Profile: No results for input(s): "CHOL", "HDL", "LDLCALC", "TRIG", "CHOLHDL", "LDLDIRECT" in the last 72 hours. Thyroid Function Tests: No results for input(s): "TSH", "T4TOTAL", "FREET4", "T3FREE", "THYROIDAB" in the last 72 hours. Anemia Panel: No results for input(s): "VITAMINB12", "FOLATE", "FERRITIN", "TIBC", "IRON", "RETICCTPCT" in the last 72 hours. Urine analysis:    Component Value Date/Time   COLORURINE YELLOW 07/15/2023 1616   APPEARANCEUR HAZY (A) 07/15/2023 1616   LABSPEC >1.046 (H) 07/15/2023 1616   PHURINE 5.0 07/15/2023 1616   GLUCOSEU NEGATIVE 07/15/2023 1616   HGBUR NEGATIVE 07/15/2023 1616   BILIRUBINUR NEGATIVE 07/15/2023 1616    BILIRUBINUR 1+ 01/30/2021 1450   KETONESUR 5 (A) 07/15/2023 1616   PROTEINUR NEGATIVE 07/15/2023 1616   UROBILINOGEN negative (A) 01/30/2021 1450   UROBILINOGEN 0.2 11/28/2019 1424   NITRITE NEGATIVE 07/15/2023 1616   LEUKOCYTESUR NEGATIVE 07/15/2023 1616   Sepsis Labs: @LABRCNTIP (procalcitonin:4,lacticidven:4)  ) Recent Results (from the past 240 hours)  Culture, blood (routine x 2)     Status: None (Preliminary result)   Collection Time: 07/15/23  3:00 PM   Specimen: BLOOD  Result Value Ref Range Status   Specimen Description BLOOD LEFT ANTECUBITAL  Final   Special Requests   Final    BOTTLES DRAWN AEROBIC AND ANAEROBIC Blood Culture results may not be optimal due to an inadequate volume of blood received in culture bottles   Culture   Final    NO GROWTH < 24 HOURS Performed at [facility], [address], [place], [place] [address]    Report Status PENDING  Incomplete  Culture, blood (routine x 2)     Status: None (Preliminary result)   Collection Time: 07/15/23  3:00 PM   Specimen: BLOOD  Result Value Ref Range Status   Specimen Description BLOOD RIGHT ANTECUBITAL  Final   Special Requests   Final    BOTTLES DRAWN AEROBIC AND ANAEROBIC Blood Culture results may not be optimal due to an inadequate volume of blood received in culture bottles   Culture   Final    NO GROWTH < 24 HOURS Performed at [facility], [address] [place], [place] [address]    Report Status PENDING  Incomplete  Resp panel by RT-PCR (RSV, Flu A&B, Covid) Anterior Nasal Swab     Status: None   Collection Time: 07/15/23  4:16 PM   Specimen: Anterior Nasal Swab  Result Value Ref Range Status   SARS Coronavirus 2 by RT PCR NEGATIVE NEGATIVE Final   Influenza A by PCR NEGATIVE NEGATIVE Final   Influenza B by PCR NEGATIVE NEGATIVE Final    Comment: (NOTE) The Xpert Xpress SARS-CoV-2/FLU/RSV plus assay is intended as an aid in the diagnosis of influenza from Nasopharyngeal swab  specimens and should not be used as a sole basis for treatment. Nasal washings and aspirates are unacceptable for Xpert Xpress SARS-CoV-2/FLU/RSV testing.  Fact Sheet for Patients: BloggerCourse.com  Fact Sheet for Healthcare Providers: SeriousBroker.it  This test is not yet approved or cleared by the [place] FDA and has been authorized for detection and/or diagnosis of SARS-CoV-2 by FDA under an Emergency Use Authorization (EUA). This EUA will remain in effect (meaning this test can be used) for the duration of  the COVID-19 declaration under Section 564(b)(1) of the Act, 21 U.S.C. section 360bbb-3(b)(1), unless the authorization is terminated or revoked.     Resp Syncytial Virus by PCR NEGATIVE NEGATIVE Final    Comment: (NOTE) Fact Sheet for Patients: BloggerCourse.com  Fact Sheet for Healthcare Providers: SeriousBroker.it  This test is not yet approved or cleared by the [place] FDA and has been authorized for detection and/or diagnosis of SARS-CoV-2 by FDA under an Emergency Use Authorization (EUA). This EUA will remain in effect (meaning this test can be used) for the duration of the COVID-19 declaration under Section 564(b)(1) of the Act, 21 U.S.C. section 360bbb-3(b)(1), unless the authorization is terminated or revoked.  Performed at [facility], [address], [place], [place] [address]          Radiology Studies: DG Chest Port 1 View Result Date: 07/15/2023 CLINICAL DATA:  Weakness. EXAM: PORTABLE CHEST 1 VIEW COMPARISON:  Chest radiograph dated 07/01/2023. FINDINGS: The heart size and mediastinal contours are within normal limits. Streaky bibasilar opacities, slightly increased at the right lung base, with background chronic interstitial changes. No sizable pleural effusion or pneumothorax. No acute osseous abnormality. IMPRESSION: Streaky  bibasilar opacities, slightly increased on the right, could reflect atelectasis or infiltrate superimposed on chronic interstitial changes. Electronically Signed   By: Hart Robinsons M.D.   On: 07/15/2023 16:36   CT ABDOMEN PELVIS W CONTRAST Result Date: 07/15/2023 CLINICAL DATA:  Left lower quadrant abdominal pain. EXAM: CT ABDOMEN AND PELVIS WITH CONTRAST TECHNIQUE: Multidetector CT imaging of the abdomen and pelvis was performed using the standard protocol following bolus administration of intravenous contrast. RADIATION DOSE REDUCTION: This exam was performed according to the departmental dose-optimization program which includes automated exposure control, adjustment of the mA and/or kV according to patient size and/or use of iterative reconstruction technique. CONTRAST:  75mL OMNIPAQUE IOHEXOL 350 MG/ML SOLN COMPARISON:  July 05, 2023. FINDINGS: Lower chest: Stable probable bibasilar scarring is noted. Hepatobiliary: No focal liver abnormality is seen. No gallstones, gallbladder wall thickening, or biliary dilatation. Pancreas: Unremarkable. No pancreatic ductal dilatation or surrounding inflammatory changes. Spleen: Normal in size without focal abnormality. Adrenals/Urinary Tract: Adrenal glands appear normal. Stable bilateral renal cysts are noted for which no further follow-up is required. Minimal nonobstructive right nephrolithiasis is noted. No hydronephrosis or renal obstruction is noted. Urinary bladder is decompressed. Stomach/Bowel: The stomach is unremarkable. No small bowel dilatation is noted. Stable colonic dilatation is noted. Mild to moderate wall thickening of proximal transverse colon is noted which may be due to lack of distension, but inflammatory or infectious colitis cannot be excluded. The appendix is not visualized. There is again noted abrupt caliber change involving the sigmoid colon which was present on prior exam, stricture cannot be excluded. Vascular/Lymphatic: Aortic  atherosclerosis. No enlarged abdominal or pelvic lymph nodes. Reproductive: Status post hysterectomy. No adnexal masses. Other: No ascites or hernia is noted. Musculoskeletal: No acute osseous abnormality is noted. IMPRESSION: Stable probable bibasilar scarring is noted in the visualized lung bases. Minimal nonobstructive right nephrolithiasis. Continued dilatation of the colon is noted in most portions, most consistent with ileus. Mild to moderate wall thickening of proximal transverse colon is noted which may be due to lack of distension, but inflammatory or infectious colitis cannot be excluded. There is again noted abrupt caliber change involving the sigmoid colon which was present on prior exam, and stricture cannot be excluded. Aortic Atherosclerosis (ICD10-I70.0). Electronically Signed   By: Zenda Alpers.D.  On: 07/15/2023 15:36   CT Head Wo Contrast Result Date: 07/15/2023 CLINICAL DATA:  Provided history: Weakness. EXAM: CT HEAD WITHOUT CONTRAST TECHNIQUE: Contiguous axial images were obtained from the base of the skull through the vertex without intravenous contrast. RADIATION DOSE REDUCTION: This exam was performed according to the departmental dose-optimization program which includes automated exposure control, adjustment of the mA and/or kV according to patient size and/or use of iterative reconstruction technique. COMPARISON:  Head CT 06/13/2023. FINDINGS: Brain: Cerebral atrophy. Patchy and ill-defined hypoattenuation within the cerebral white matter, nonspecific but compatible with moderate chronic small vessel ischemic disease. There is no acute intracranial hemorrhage. No demarcated cortical infarct. No extra-axial fluid collection. No evidence of an intracranial mass. No midline shift. Vascular: No hyperdense vessel. Atherosclerotic calcifications. Skull: No calvarial fracture or aggressive osseous lesion. Sinuses/Orbits: No mass or acute finding within the imaged orbits. Minimal mucosal  thickening within the bilateral ethmoid and right maxillary sinuses. Other: Small-volume fluid within left mastoid air cells. IMPRESSION: 1. No evidence of an acute intracranial abnormality. 2. Moderate chronic small vessel ischemic changes within the cerebral white matter. 3. Cerebral atrophy. 4. Small left mastoid effusion. Electronically Signed   By: Jackey Loge D.O.   On: 07/15/2023 14:46        Scheduled Meds:  atorvastatin  10 mg Oral Daily   diclofenac Sodium  2 g Topical TID   DULoxetine  60 mg Oral Daily   gabapentin  300 mg Oral BID   hydrocortisone sod succinate (SOLU-CORTEF) inj  100 mg Intravenous Q12H   insulin aspart  0-6 Units Subcutaneous TID WC   levothyroxine  50 mcg Oral Daily   lidocaine  1 patch Transdermal Q24H   midodrine  5 mg Oral TID WC   morphine  15 mg Oral Q12H   pantoprazole (PROTONIX) IV  40 mg Intravenous Q12H   sodium chloride flush  3 mL Intravenous Q12H   Continuous Infusions:  cefTRIAXone (ROCEPHIN)  IV Stopped (07/15/23 2349)   lactated ringers 75 mL/hr at 07/16/23 0332   metronidazole Stopped (07/16/23 0446)     LOS: 0 days    Time spent: 55 minutes.    [name], MD  [facility] Pager #: [phone] 7PM-7AM contact night coverage as above

## 2023-07-16 NOTE — Consult Note (Addendum)
 Consultation  Referring Provider:ERMD/Hemderly PA-C Primary Care Physician:  [name], MD Primary Gastroenterologist:  Dr.Pyrtle  Reason for Consultation: Achiness, left lower quadrant abdominal pain, diarrhea  HPI: [name] is a 83 y.o. female with multiple significant comorbidities, including but not limited to hypertension, diabetes mellitus with neuropathy, chronic kidney disease, chronic respiratory failure/bronchiectasis on 3 L nasal cannula chronically at home, rheumatoid arthritis, history of DVT/PE, on anticoagulation. Patient has had 2 very recent admissions, initially here from 1/25 through 06/26/2023 with a prolonged ileus, and was discharged on twice daily MiraLAX. She was readmitted from her rehab unit to [facility] from 2/11 through 07/10/2023 when she presented with  abdominal pain, and at that time by CT imaging was felt to have an acute enteritis and was started on IV antibiotics.  She was also noted on imaging to have significant sigmoid stricture.  She was seen by surgery/Dr. Maurine Minister, who gave consideration to [facility] procedure but felt that she had about 40% risk of death to her multiple comorbidities.  It was advised she continue on MiraLAX enough to give her even liquid stool. She was transferred to rehab on 07/10/2023, off antibiotics at that time. However per the discharge summary it was noted that they added Lomotil to her med list on discharge.  Antibiotics have been stopped.  Comes back to the ER yesterday with weakness and abdominal pain, also complaining of ongoing diarrhea usually having 2-3 bowel movements per day, she says her stools look dark but she had not noticed any blood.  She has had ongoing abdominal discomfort, not really necessarily worse than what she has been having over the past few weeks.  Appetite has been poor She did have a leukocytosis with WBC of 12.9, noted to be heme positive, respiratory panel negative Blood cultures  were done and are pending Chest x-ray showed some streaky opacities versus atelectasis versus pneumonia in the setting of her known bronchiectasis. CT of the abdomen pelvis shows a dilated colon consistent with an ileus, there is wall thickening of the transverse colon unclear whether this is due to lack of distention versus some infection/inflammation, then abrupt caliber change of the sigmoid colon likely secondary to stricture.  She has been started on vancomycin/cefepime and IV metronidazole. Early this morning she developed tachycardia to the 130s, and worsening hypotension with blood pressure 80 over 50s. She was stress dosed with hydrocortisone and started on midodrine 5 mg 3 times daily.  Blood pressure currently in the 150s systolic, she continues to feel poorly and general with weakness.  She says she has been having ongoing abdominal discomfort both in the left lower abdomen and some across the upper abdomen.  No nausea or vomiting.  Labs today show WBC 12.7/hemoglobin 11.1/hematocrit 36.7 Potassium 3.8 Creatinine 0.88 LFTs within normal limits. Lactate has normalized was 3.1 on arrival. Blood cultures pending  Last colonoscopy was done per Dr. Rhea Belton in November 2023 with finding of multiple diverticuli in the sigmoid and descending colon, where there was a stricture of 4 cm length in the mid sigmoid which was able to be traversed with an ultraslim scope. She was referred to surgery at that time, not clear whether she was actually seen by surgery or not but did not wind up having any surgical procedure.   Past Medical History:  Diagnosis Date   Acute encephalopathy 03/30/2021   11/12 - 04/03/2021 acute encephalopathy in the context of altered mental status and poor oral intake following increasing pain  medicines and associated Klebsiella UTI.     Acute kidney injury superimposed on chronic kidney disease (HCC) 04/17/2015   11/12 - 04/03/2021 AKI with creatinine of 2.22 and GFR  22 indicating stage IV renal disease in the context of encephalopathy secondary to increased pain medications with decreased oral intake of fluids and nutrition.     Acute on chronic diastolic (congestive) heart failure (HCC)    Anemia    iron deficiency anemia - secondary to blood loss ( chronic)    Anxiety    Arthritis    endstage changes bilateral knees/bilateral ankles.    Asthma    Carotid artery occlusion    Chest pain of uncertain etiology 01/28/2012   Normal coronaries at cath     Chronic fatigue    Chronic kidney disease    Closed intertrochanteric fracture of hip, left, initial encounter (HCC) 11/29/2016   Closed left hip fracture (HCC)    Closed nondisplaced intertrochanteric fracture of left femur with delayed healing 01/14/2017   Clotting disorder (HCC)    pt denies this   Contusion of left knee    due to fall 1/14.   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     Morbid obesity (HCC)    Neuromuscular disorder (HCC)    diabetic neuropathy    Normal coronary arteries    cardiac catheterization performed  10/31/14   Pneumonia    RA (rheumatoid arthritis) (HCC)    has been off methotreaxte since 10/13.   Septic shock (HCC) 06/13/2023   Spinal stenosis of lumbar region    Thyroid disease    Unspecified lack of coordination    URI (upper respiratory infection)    UTI (urinary tract infection) 03/31/2021   11/12 - 04/03/2021 Klebsiella UTI associated with encephalopathy  in the context of AKI.  Status post IV ceftriaxone followed by oral Keflex x48 hours.      Past Surgical History:  Procedure Laterality Date   BRAIN SURGERY     Gamma knife 10/13. Needs repeat spring  '14   CARDIAC CATHETERIZATION N/A 10/31/2014   Procedure: Right/Left Heart Cath and Coronary Angiography;  Surgeon: Corky Crafts, MD;  Location: [facility] INVASIVE CV LAB;  Service: Cardiovascular;  Laterality: N/A;   COLONOSCOPY WITH PROPOFOL N/A 04/14/2022   Procedure: COLONOSCOPY WITH PROPOFOL;  Surgeon: Beverley Fiedler, MD;  Location: [facility] ENDOSCOPY;  Service: Gastroenterology;  Laterality: N/A;   CYST EXCISION     2 on face   CYST REMOVAL NECK     ESOPHAGOGASTRODUODENOSCOPY (EGD) WITH PROPOFOL N/A 09/14/2014   Procedure: ESOPHAGOGASTRODUODENOSCOPY (EGD) WITH PROPOFOL;  Surgeon: Louis Meckel, MD;  Location: [facility] ENDOSCOPY;  Service: Endoscopy;  Laterality: N/A;   INCISION AND DRAINAGE HIP Left 01/16/2017   Procedure: IRRIGATION AND DEBRIDEMENT LEFT HIP;  Surgeon: Kathryne Hitch, MD;  Location: [facility] ORS;  Service: Orthopedics;  Laterality: Left;  INTRAMEDULLARY (IM) NAIL INTERTROCHANTERIC Left 11/29/2016   Procedure: INTRAMEDULLARY (IM) NAIL INTERTROCHANTRIC;  Surgeon: Kathryne Hitch, MD;  Location: [facility] OR;  Service: Orthopedics;  Laterality: Left;   OVARY SURGERY     RIGHT HEART CATH N/A 08/12/2021   Procedure: RIGHT HEART CATH;  Surgeon: Runell Gess, MD;  [facility] INVASIVE CV LAB;  Service: Cardiovascular;  Laterality: N/A;   SHOULDER SURGERY Left    TONSILLECTOMY  age 77   VAGINAL HYSTERECTOMY     VIDEO BRONCHOSCOPY Bilateral 05/31/2013   Procedure: VIDEO BRONCHOSCOPY WITHOUT FLUORO;  Surgeon: Kalman Shan, MD;  Location: [facility] ENDOSCOPY;  Service: Cardiopulmonary;  Laterality: Bilateral;   video bronscoscopy  05/19/1998   lung    Prior to Admission medications   Medication Sig Start Date End Date Taking? Authorizing Provider  albuterol (VENTOLIN HFA) 108  (90 Base) MCG/ACT inhaler Inhale 2 puffs into the lungs as needed for wheezing or shortness of breath.   Yes [provider]  Artificial Tear Ointment (DRY EYES OP) Place 2 drops into both eyes as needed (dry eyes).   Yes [provider]  atorvastatin (LIPITOR) 10 MG tablet Take 1 tablet (10 mg total) by mouth daily. 08/14/22  Yes [name], MD  azithromycin (ZITHROMAX) 250 MG tablet Take 1 tablet (250 mg total) by mouth every Monday, Wednesday, and Friday. Take every Monday, Wednesday, Friday. 06/03/23 06/02/24 Yes Kalman Shan, MD  Calcium Carb-Cholecalciferol (CALCIUM + D3 PO) Take 2 tablets by mouth daily.   Yes [provider]  Camphor-Menthol-Methyl Sal (SALONPAS EX) Apply 1 patch topically as needed (shoulder and leg pain).   Yes [provider]  Cholecalciferol (VITAMIN D3) 50 MCG (2000 UT) capsule Take 2,000 Units by mouth daily.   Yes [provider]  diclofenac Sodium (VOLTAREN) 1 % GEL Apply 2 g topically 3 (three) times daily. Apply to both hands and knees Patient taking differently: Apply 2 g topically in the morning, at noon, and at bedtime. Apply to both hands and knees. 05/03/21  Yes Medina-Vargas, Monina C, NP  dicyclomine (BENTYL) 20 MG tablet Take 1 tablet (20 mg total) by mouth 4 (four) times daily -  before meals and at bedtime. 07/10/23  Yes Sreenath, Sudheer B, MD  diphenoxylate-atropine (LOMOTIL) 2.5-0.025 MG tablet Take 1 tablet by mouth 4 (four) times daily as needed for diarrhea or loose stools. 07/09/23  Yes Sreenath, Sudheer B, MD  docusate sodium (COLACE) 100 MG capsule Take 2 capsules (200 mg total) by mouth 2 (two) times daily. Hold for diarrhea. 06/26/23  Yes Elgergawy, Leana Roe, MD  DULoxetine (CYMBALTA) 60 MG capsule Take 1 capsule (60 mg total) by mouth daily. 11/27/22  Yes Jones Bales, NP  ferrous sulfate 324 MG TBEC Take 324 mg by mouth every evening.   Yes [provider]  fluticasone (FLONASE) 50  MCG/ACT nasal spray Place 1 spray into both nostrils daily. 11/28/22  Yes [name], MD  furosemide (LASIX) 20 MG tablet Take 3 tablets (60 mg total) by mouth daily. Patient taking differently: Take 20 mg by mouth daily. 06/27/23  Yes Elgergawy, Leana Roe, MD  gabapentin (NEURONTIN) 600 MG tablet Take 0.5 tablets (300 mg total) by mouth 2 (two) times daily. 06/26/23  Yes Elgergawy, Leana Roe, MD  guaiFENesin (MUCINEX) 600 MG 12 hr tablet Take 2 tablets (1,200 mg total) by mouth 2 (two) times daily. 05/03/21  Yes Medina-Vargas, Monina C, NP  hydrALAZINE (APRESOLINE) 25 MG tablet Take 25 mg by  mouth 2 (two) times daily.   Yes [provider]  hydroxychloroquine (PLAQUENIL) 200 MG tablet Take 1 tablet (200 mg total) by mouth daily. 05/03/21  Yes Medina-Vargas, Monina C, NP  leflunomide (ARAVA) 20 MG tablet Take 1 tablet (20 mg total) by mouth daily. 05/03/21 08/09/23 Yes Medina-Vargas, Monina C, NP  levothyroxine (SYNTHROID) 50 MCG tablet Take 1 tablet (50 mcg total) by mouth daily. 11/28/22  Yes [name], MD  lidocaine (LIDODERM) 5 % Place 1 patch onto the skin daily. Remove & Discard patch within 12 hours or as directed by MD 12/24/22  Yes [name], MD  linaclotide (LINZESS) 72 MCG capsule Take 72 mcg by mouth daily before breakfast.   Yes [provider]  metolazone (ZAROXOLYN) 2.5 MG tablet TAKE 1 TABLET BY MOUTH DAILY AS NEEDED FOR 2 DAYS FOR WEIGHT ABOVE 255 POUNDS 05/03/21  Yes Medina-Vargas, Monina C, NP  metoprolol succinate (TOPROL-XL) 50 MG 24 hr tablet Take 1 tablet (50 mg total) by mouth daily. Take with or immediately following a meal. 07/10/23  Yes Sreenath, Sudheer B, MD  morphine (MS CONTIN) 15 MG 12 hr tablet Take 1 tablet (15 mg total) by mouth every 12 (twelve) hours. SNF use only 07/09/23  Yes Sreenath, Sudheer B, MD  Multiple Vitamin (MULTIVITAMIN WITH MINERALS) TABS Take 1 tablet by mouth daily.    Yes [provider]  olmesartan (BENICAR) 40 MG  tablet Take 40 mg by mouth daily.   Yes [provider]  ondansetron (ZOFRAN-ODT) 4 MG disintegrating tablet Take 1 tablet (4 mg total) by mouth every 8 (eight) hours as needed for nausea or vomiting. 06/12/23  Yes [name], MD  oxyCODONE (OXY IR/ROXICODONE) 5 MG immediate release tablet Take 1 tablet (5 mg total) by mouth every 6 (six) hours as needed for moderate pain (pain score 4-6). SNF use only 07/09/23  Yes Sreenath, Sudheer B, MD  pantoprazole (PROTONIX) 40 MG tablet Take 1 tablet (40 mg total) by mouth 2 (two) times daily. 06/23/23  Yes [name], MD  polyethylene glycol (MIRALAX / GLYCOLAX) 17 g packet Take 17 g by mouth 2 (two) times daily. Hold for diarrhea 06/26/23  Yes Elgergawy, Leana Roe, MD  Potassium Chloride ER 20 MEQ TBCR Please take 20 mg p.o. twice daily for first 3 days, then go back to 20 mg oral daily, monitor frequently and adjust as needed Patient taking differently: Take 20 mEq by mouth daily. Please take 20 mg p.o. twice daily for first 3 days, then go back to 20 mg oral daily, monitor frequently and adjust as needed 06/26/23  Yes Elgergawy, Leana Roe, MD  predniSONE (DELTASONE) 10 MG tablet Take 1 tablet (10 mg total) by mouth daily with breakfast. 05/22/21  Yes [name], MD  Semaglutide,0.25 or 0.5MG /DOS, (OZEMPIC, 0.25 OR 0.5 MG/DOSE,) 2 MG/1.5ML SOPN Inject 0.5 mg into the skin once a week.   Yes [provider]  simethicone (MYLICON) 80 MG chewable tablet Chew 1 tablet (80 mg total) by mouth every 6 (six) hours as needed for flatulence. 06/26/23  Yes Elgergawy, Leana Roe, MD  zinc oxide 20 % ointment Apply 1 Application topically See admin instructions. Every shift. Apply to MASD to left buttocks and with each incontinent episode.   Yes [provider]  allopurinol (ZYLOPRIM) 300 MG tablet Take 1 tablet (300 mg total) by mouth daily. Patient not taking: Reported on 07/15/2023 05/03/21   Medina-Vargas, Avanell Shackleton C, NP  colchicine 0.6  MG  tablet Take 1 tablet (0.6 mg total) by mouth daily as needed (gout flares). Patient not taking: Reported on 07/15/2023 05/03/21   Medina-Vargas, Monina C, NP  OXYGEN Inhale 3 L into the lungs See admin instructions. 3 L at bedtime, and 3 L during the day as needed for exertion    [provider]    Current Facility-Administered Medications  Medication Dose Route Frequency Provider Last Rate Last Admin   acetaminophen (TYLENOL) tablet 650 mg  650 mg Oral Q6H PRN Synetta Fail, MD   650 mg at 07/16/23 4540   Or   acetaminophen (TYLENOL) suppository 650 mg  650 mg Rectal Q6H PRN Synetta Fail, MD       albuterol (VENTOLIN HFA) 108 (90 Base) MCG/ACT inhaler 2 puff  2 puff Inhalation Q2H PRN Synetta Fail, MD       atorvastatin (LIPITOR) tablet 10 mg  10 mg Oral Daily Synetta Fail, MD   10 mg at 07/16/23 9811   cefTRIAXone (ROCEPHIN) 2 g in sodium chloride 0.9 % 100 mL IVPB  2 g Intravenous Q24H Synetta Fail, MD   Stopped at 07/15/23 2349   diclofenac Sodium (VOLTAREN) 1 % topical gel 2 g  2 g Topical TID Synetta Fail, MD   2 g at 07/16/23 9147   DULoxetine (CYMBALTA) DR capsule 60 mg  60 mg Oral Daily Synetta Fail, MD   60 mg at 07/16/23 8295   gabapentin (NEURONTIN) capsule 300 mg  300 mg Oral BID Synetta Fail, MD   300 mg at 07/16/23 6213   hydrocortisone sodium succinate (SOLU-CORTEF) 100 MG injection 100 mg  100 mg Intravenous Domenica Fail, MD   100 mg at 07/16/23 0556   insulin aspart (novoLOG) injection 0-6 Units  0-6 Units Subcutaneous TID WC Synetta Fail, MD       lactated ringers infusion   Intravenous Continuous Dolly Rias, MD 75 mL/hr at 07/16/23 0332 New Bag at 07/16/23 0332   levothyroxine (SYNTHROID) tablet 50 mcg  50 mcg Oral Daily Synetta Fail, MD   50 mcg at 07/16/23 0507   lidocaine (LIDODERM) 5 % 1 patch  1 patch Transdermal Q24H Synetta Fail, MD       metroNIDAZOLE (FLAGYL) IVPB 500  mg  500 mg Intravenous Q12H Synetta Fail, MD   Stopped at 07/16/23 0446   midodrine (PROAMATINE) tablet 5 mg  5 mg Oral TID WC Dolly Rias, MD   5 mg at 07/16/23 0556   morphine (MS CONTIN) 12 hr tablet 15 mg  15 mg Oral Q12H Synetta Fail, MD   15 mg at 07/16/23 0918   ondansetron (ZOFRAN) injection 4 mg  4 mg Intravenous Q8H PRN Synetta Fail, MD   4 mg at 07/15/23 2354   oxyCODONE (Oxy IR/ROXICODONE) immediate release tablet 5 mg  5 mg Oral Q6H PRN Synetta Fail, MD       pantoprazole (PROTONIX) injection 40 mg  40 mg Intravenous Q12H Synetta Fail, MD   40 mg at 07/16/23 0865   sodium chloride flush (NS) 0.9 % injection 3 mL  3 mL Intravenous Q12H Synetta Fail, MD   3 mL at 07/15/23 2304   Current Outpatient Medications  Medication Sig Dispense Refill   albuterol (VENTOLIN HFA) 108 (90 Base) MCG/ACT inhaler Inhale 2 puffs into the lungs as needed for wheezing or shortness of breath.     Artificial Tear  Ointment (DRY EYES OP) Place 2 drops into both eyes as needed (dry eyes).     atorvastatin (LIPITOR) 10 MG tablet Take 1 tablet (10 mg total) by mouth daily. 90 tablet 3   azithromycin (ZITHROMAX) 250 MG tablet Take 1 tablet (250 mg total) by mouth every Monday, Wednesday, and Friday. Take every Monday, Wednesday, Friday. 12 tablet 11   Calcium Carb-Cholecalciferol (CALCIUM + D3 PO) Take 2 tablets by mouth daily.     Camphor-Menthol-Methyl Sal (SALONPAS EX) Apply 1 patch topically as needed (shoulder and leg pain).     Cholecalciferol (VITAMIN D3) 50 MCG (2000 UT) capsule Take 2,000 Units by mouth daily.     diclofenac Sodium (VOLTAREN) 1 % GEL Apply 2 g topically 3 (three) times daily. Apply to both hands and knees (Patient taking differently: Apply 2 g topically in the morning, at noon, and at bedtime. Apply to both hands and knees.) 100 g 1   dicyclomine (BENTYL) 20 MG tablet Take 1 tablet (20 mg total) by mouth 4 (four) times daily -  before meals  and at bedtime.     diphenoxylate-atropine (LOMOTIL) 2.5-0.025 MG tablet Take 1 tablet by mouth 4 (four) times daily as needed for diarrhea or loose stools.     docusate sodium (COLACE) 100 MG capsule Take 2 capsules (200 mg total) by mouth 2 (two) times daily. Hold for diarrhea.     DULoxetine (CYMBALTA) 60 MG capsule Take 1 capsule (60 mg total) by mouth daily. 90 capsule 2   ferrous sulfate 324 MG TBEC Take 324 mg by mouth every evening.     fluticasone (FLONASE) 50 MCG/ACT nasal spray Place 1 spray into both nostrils daily. 16 g 3   furosemide (LASIX) 20 MG tablet Take 3 tablets (60 mg total) by mouth daily. (Patient taking differently: Take 20 mg by mouth daily.)     gabapentin (NEURONTIN) 600 MG tablet Take 0.5 tablets (300 mg total) by mouth 2 (two) times daily.     guaiFENesin (MUCINEX) 600 MG 12 hr tablet Take 2 tablets (1,200 mg total) by mouth 2 (two) times daily. 120 tablet 0   hydrALAZINE (APRESOLINE) 25 MG tablet Take 25 mg by mouth 2 (two) times daily.     hydroxychloroquine (PLAQUENIL) 200 MG tablet Take 1 tablet (200 mg total) by mouth daily. 30 tablet 0   leflunomide (ARAVA) 20 MG tablet Take 1 tablet (20 mg total) by mouth daily. 30 tablet 0   levothyroxine (SYNTHROID) 50 MCG tablet Take 1 tablet (50 mcg total) by mouth daily. 90 tablet 3   lidocaine (LIDODERM) 5 % Place 1 patch onto the skin daily. Remove & Discard patch within 12 hours or as directed by MD 60 patch 11   linaclotide (LINZESS) 72 MCG capsule Take 72 mcg by mouth daily before breakfast.     metolazone (ZAROXOLYN) 2.5 MG tablet TAKE 1 TABLET BY MOUTH DAILY AS NEEDED FOR 2 DAYS FOR WEIGHT ABOVE 255 POUNDS 30 tablet 0   metoprolol succinate (TOPROL-XL) 50 MG 24 hr tablet Take 1 tablet (50 mg total) by mouth daily. Take with or immediately following a meal.     morphine (MS CONTIN) 15 MG 12 hr tablet Take 1 tablet (15 mg total) by mouth every 12 (twelve) hours. SNF use only 10 tablet 0   Multiple Vitamin (MULTIVITAMIN  WITH MINERALS) TABS Take 1 tablet by mouth daily.      olmesartan (BENICAR) 40 MG tablet Take 40 mg by mouth daily.  ondansetron (ZOFRAN-ODT) 4 MG disintegrating tablet Take 1 tablet (4 mg total) by mouth every 8 (eight) hours as needed for nausea or vomiting. 30 tablet 3   oxyCODONE (OXY IR/ROXICODONE) 5 MG immediate release tablet Take 1 tablet (5 mg total) by mouth every 6 (six) hours as needed for moderate pain (pain score 4-6). SNF use only 10 tablet 0   pantoprazole (PROTONIX) 40 MG tablet Take 1 tablet (40 mg total) by mouth 2 (two) times daily. 120 tablet 4   polyethylene glycol (MIRALAX / GLYCOLAX) 17 g packet Take 17 g by mouth 2 (two) times daily. Hold for diarrhea     Potassium Chloride ER 20 MEQ TBCR Please take 20 mg p.o. twice daily for first 3 days, then go back to 20 mg oral daily, monitor frequently and adjust as needed (Patient taking differently: Take 20 mEq by mouth daily. Please take 20 mg p.o. twice daily for first 3 days, then go back to 20 mg oral daily, monitor frequently and adjust as needed)     predniSONE (DELTASONE) 10 MG tablet Take 1 tablet (10 mg total) by mouth daily with breakfast. 90 tablet 1   Semaglutide,0.25 or 0.5MG /DOS, (OZEMPIC, 0.25 OR 0.5 MG/DOSE,) 2 MG/1.5ML SOPN Inject 0.5 mg into the skin once a week.     simethicone (MYLICON) 80 MG chewable tablet Chew 1 tablet (80 mg total) by mouth every 6 (six) hours as needed for flatulence.     zinc oxide 20 % ointment Apply 1 Application topically See admin instructions. Every shift. Apply to MASD to left buttocks and with each incontinent episode.     allopurinol (ZYLOPRIM) 300 MG tablet Take 1 tablet (300 mg total) by mouth daily. (Patient not taking: Reported on 07/15/2023) 30 tablet 0   [Paused] colchicine 0.6 MG tablet Take 1 tablet (0.6 mg total) by mouth daily as needed (gout flares). (Patient not taking: Reported on 07/15/2023) 30 tablet 0   OXYGEN Inhale 3 L into the lungs See admin instructions. 3 L at  bedtime, and 3 L during the day as needed for exertion      Allergies as of 07/15/2023 - Review Complete 07/15/2023  Allergen Reaction Noted   Codeine Swelling and Other (See Comments) 09/11/2011   Infliximab Anaphylaxis 09/11/2011   Lisinopril Swelling and Rash 09/11/2011   Ofev [nintedanib] Diarrhea 04/01/2022    Family History  Problem Relation Age of Onset   Diabetes Mother    Heart attack Mother    Hypertension Father    Lung cancer Father    Diabetes Sister    Breast cancer Sister    Breast cancer Sister    Diabetes Brother    Hypertension Brother    Heart disease Brother    Heart attack Brother    Kidney cancer Brother    Gout Brother    Kidney failure Brother        x 5   Esophageal cancer Brother    Stomach cancer Brother    Prostate cancer Brother    Uterine cancer Daughter        with mets   Fibroids Daughter    Hypertension Daughter    Hypertension Daughter    Hypertension Son    Heart murmur Son    Rheum arthritis Maternal Uncle    Alzheimer's disease Neg Hx    Dementia Neg Hx     Social History   Socioeconomic History   Marital status: Married    Spouse name: Not on file  Number of children: 6   Years of education: college   Highest education level: Bachelor's degree (e.g., BA, AB, BS)  Occupational History   Occupation: retired Engineer, civil (consulting)  Tobacco Use   Smoking status: Never   Smokeless tobacco: Never  Vaping Use   Vaping status: Never Used  Substance and Sexual Activity   Alcohol use: No   Drug use: No   Sexual activity: Not Currently  Other Topics Concern   Not on file  Social History Narrative   Patient consumes 2-3 cups coffee per day.   Social Drivers of Corporate investment banker Strain: Low Risk  (06/11/2023)   Overall Financial Resource Strain (CARDIA)    Difficulty of Paying Living Expenses: Not hard at all  Food Insecurity: No Food Insecurity (07/01/2023)   Hunger Vital Sign    Worried About Running Out of Food in the Last  Year: Never true    Ran Out of Food in the Last Year: Never true  Transportation Needs: No Transportation Needs (07/01/2023)   PRAPARE - Administrator, Civil Service (Medical): No    Lack of Transportation (Non-Medical): No  Physical Activity: Inactive (06/11/2023)   Exercise Vital Sign    Days of Exercise per Week: 0 days    Minutes of Exercise per Session: 20 min  Stress: No Stress Concern Present (10/27/2022)   [employer] of Occupational Health - Occupational Stress Questionnaire    Feeling of Stress : Not at all  Social Connections: Socially Integrated (07/01/2023)   Social Connection and Isolation Panel [NHANES]    Frequency of Communication with Friends and Family: More than three times a week    Frequency of Social Gatherings with Friends and Family: Twice a week    Attends Religious Services: 1 to 4 times per year    Active Member of [employer] or Organizations: Yes    Attends Banker Meetings: 1 to 4 times per year    Marital Status: Married  Catering manager Violence: Not At Risk (07/01/2023)   Humiliation, Afraid, Rape, and Kick questionnaire    Fear of Current or Ex-Partner: No    Emotionally Abused: No    Physically Abused: No    Sexually Abused: No    Review of Systems: Pertinent positive and negative review of systems were noted in the above HPI section.  All other review of systems was otherwise negative.   Physical Exam: Vital signs in last 24 hours: Temp:  [97.6 F (36.4 C)-100 F (37.8 C)] 99.2 F (37.3 C) (02/27 0725) Pulse Rate:  [111-132] 117 (02/27 0800) Resp:  [14-33] 21 (02/27 0800) BP: (74-147)/(45-116) 131/78 (02/27 0800) SpO2:  [89 %-100 %] 100 % (02/27 0800)   General:   Alert,  Well-developed, obese, elderly African-American female acute and chronically ill-appearing, weak, on O2 at 3 L, pleasant and cooperative in NAD-daughter at bedside Head:  Normocephalic and atraumatic. Eyes:  Sclera clear, no icterus.   Conjunctiva  pink. Ears:  Normal auditory acuity. Nose:  No deformity, discharge,  or lesions. Mouth:  No deformity or lesions.   Neck:  Supple; no masses or thyromegaly. Lungs:  Clear throughout to auscultation.  Breath sounds bilaterally  Heart:  Regular rate and rhythm; no murmurs, clicks, rubs,  or gallops. Abdomen: Obese, bowel sounds quiet she has some tenderness in the left lower left mid quadrant and across the hypogastrium, no guarding or rebound no palpable mass or hepatosplenomegaly.   Rectal:  not Done, documented heme positive  Msk:  Symmetrical without gross deformities. . Pulses:  Normal pulses noted. Extremities:  Without clubbing or edema. Neurologic:  Alert and  oriented x4;  grossly normal neurologically. Skin:  Intact without significant lesions or rashes.. Psych:  Alert and cooperative. Normal mood and affect.  Intake/Output from previous day: 02/26 0701 - 02/27 0700 In: 3650 [IV Piggyback:3650] Out: -  Intake/Output this shift: No intake/output data recorded.  Lab Results: Recent Labs    07/15/23 1209 07/15/23 1215 07/15/23 2355 07/16/23 0337  WBC 11.9*  --  13.6* 12.7*  HGB 11.8* 12.9 11.1* 11.1*  HCT 38.7 38.0 37.2 36.7  PLT 207  --  196 200   BMET Recent Labs    07/15/23 1209 07/15/23 1215 07/16/23 0337  NA 140 140 140  K 3.4* 3.3* 3.8  CL 105 106 108  CO2 21*  --  21*  GLUCOSE 100* 104* 96  BUN 17 18 15   CREATININE 1.09* 1.10* 0.88  CALCIUM 8.3*  --  8.0*   LFT Recent Labs    07/16/23 0337  PROT 4.5*  ALBUMIN 2.1*  AST 15  ALT 11  ALKPHOS 37*  BILITOT 0.7   PT/INR Recent Labs    07/15/23 1700  LABPROT 15.8*  INR 1.2      IMPRESSION:  #67 83 year old African-American female admitted from rehab facility with complaints of weakness, and ongoing loose stools and abdominal pain.  She has had a complicated recent past history with 2 recent admissions, just discharged from [facility] on 07/10/2023 after hospitalization for the same  symptoms. He was diagnosed with a probable enteritis during that admission and treated with antibiotics She was evaluated by surgery for the known sigmoid stricture, and felt to be a very poor surgical candidate with 40% operative mortality risk.  Consideration was given to a Hartmann procedure.  Ultimately advised to stay on enough laxative/MiraLAX to produce loose to liquid stools daily  Discharge to rehab with addition of as needed Lomotil.  Workup here with CT imaging again shows a colonic ileus, with moderate wall thickening of the proximal transverse colon possibly due to lack of distention versus inflammatory process, and abrupt caliber change involving her sigmoid colon in the area of the known sigmoid stricture  She met sepsis criteria on admission and has now been started on broad-spectrum antibiotics, blood cultures pending Has required blood pressure support with midodrine and stress dose hydrocortisone of chronic steroid use  Patient is also on chronic oral narcotics, contributing to ileus.  I think she is functionally developing obstruction at the sigmoid stricture and suspect that her ongoing diarrhea is due to functional obstruction with inability of anything to pass other than liquids.  Will rule out C. difficile or other infectious etiologies of diarrhea given recent hospitalizations and IV antibiotics.  #2 chronic respiratory failure on chronic  O2 #3, rheumatoid arthritis, steroid-dependent #4.  Diabetes mellitus, #5.  Chronic kidney disease #6.  History of DVT/PE on anticoagulation #7 chronic narcotic dependence   PLAN: Clear liquid diet C Diff-Quik screening, GI path panel  Do Not give antidiarrheals Await blood cultures -She is going to be a Colonoscopy candidate as the stricture was very tight 2 years ago, barely able to get a slim scope through. She is a poor surgical candidate, but may ultimately need to be seen by surgery again for consideration of perhaps  diverting colostomy Keep potassium/magnesium and phosphorus within normal ranges. Minimize any extra narcotics. GI will follow with you    Amy  EsterwoodPA-C  07/16/2023, 10:44 AM  I have taken an interval history, thoroughly reviewed the chart and examined the patient. I agree with the Advanced Practitioner's note, impression and recommendations, and have recorded additional findings, impressions and recommendations below. I performed a substantive portion of this encounter (>50% time spent), including a complete performance of the medical decision making.  My additional thoughts are as follows:  I saw this patient in the ER with her daughter at the bedside. I also personally reviewed the images from the last 2 CT scans of the abdomen and pelvis  Medically complex patient with a worsening high-grade left colonic stricture that is known to be from diverticulosis (inpatient colonoscopy by Dr. Rhea Belton November 2023).  She is having pain and diarrhea from the same process.  I doubt her diarrhea is from C. difficile, though we will check it to be sure.  I think it is because she has such a high-grade stricture at only liquid stool is passing through that area.  Unfortunately, such a benign stricture is not amenable to a colonic stent due to the high risk of migration for benign colonic strictures.  I completely understand that she is a high risk surgical candidate, and that a resection of the area with a Hartmann pouch and end colostomy (as perhaps was considered during her recent hospitalization at [facility]), would be high morbidity procedure for her.  Perhaps a diverting loop colostomy would be considered and is probably a less morbid procedure.  It would would be permanent, but would relieve her obstruction. She understands and is willing to consider that because she understands her condition is worsening and she really cannot go on like this.  I had a conversation about her with Dr. Marcille Blanco  of the surgical service, and his team will see her in consultation tomorrow to discuss options.  Ruling out C. difficile, but otherwise no additional recommendations from the GI service.  Avoid antidiarrheals in this patient as they will worsen her obstruction.  Inpatient GI service signing off, but feel free to call as needed arises.  Charlie Pitter III Office:[phone]

## 2023-07-16 NOTE — Consult Note (Signed)
 Consult Note  [name] [birth year]  [id number].    Requesting MD: Dr. Myrtie Neither Chief Complaint/Reason for Consult: colonic stricture  HPI:  83 y.o. 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 This was at [facility] and she was evaluated by general surgery Dr. Maurine Minister and considered high risk for surgery.  She was discharged to SNF on bid miralax regimen. She has been having black stool for 1 month with 2-3 loose bowel movements per day. She has had persistent abdominal pain worst in the LLQ which is overall stable. She reports poor appetite.   Work up in ED significant for CT scan showing "dilatation of the colon is noted .Marland KitchenMarland KitchenMild to moderate wall thickening of proximal transverse colon is noted which may be due to lack of distension, but inflammatory or infectious colitis cannot be excluded. There is again noted abrupt caliber change involving the sigmoid colon which was present on prior exam, and stricture cannot be excluded"  GI was consulted. Last colonoscopy 2023 with findings of multiple diverticuli in the sigmoid and descending colon, where there was a stricture of 4 cm length in the mid sigmoid which was able to be traversed with an ultraslim scope.   She is not currently on anticoagulation. Prior abdominal surgeries include vaginal hysterectomy and bilateral ovarian surgery.  She has been off Ozempic for a month.   ROS: Reviewed and as above Positive for abdominal pain, distention, diarrhea  Family History  Problem Relation Age of Onset   Diabetes Mother     Heart attack Mother    Hypertension Father    Lung cancer Father    Diabetes Sister    Breast cancer Sister    Breast cancer Sister    Diabetes Brother    Hypertension Brother    Heart disease Brother    Heart attack Brother    Kidney cancer Brother    Gout Brother    Kidney failure Brother        x 5   Esophageal cancer Brother    Stomach cancer Brother    Prostate cancer Brother    Uterine cancer Daughter        with mets   Fibroids Daughter    Hypertension Daughter    Hypertension Daughter    Hypertension Son    Heart murmur Son    Rheum arthritis Maternal Uncle    Alzheimer's disease Neg Hx    Dementia Neg Hx     Past Medical History:  Diagnosis Date   Acute encephalopathy 03/30/2021   11/12 - 04/03/2021 acute encephalopathy in the context of altered mental status and poor oral intake following increasing pain medicines and associated Klebsiella UTI.     Acute kidney injury superimposed on chronic kidney disease (HCC) 04/17/2015   11/12 - 04/03/2021 AKI with creatinine of 2.22 and GFR 22 indicating stage IV renal disease in the context of encephalopathy secondary to increased pain medications with decreased oral intake of fluids and nutrition.     Acute on chronic diastolic (congestive) heart failure (HCC)    Anemia  iron deficiency anemia - secondary to blood loss ( chronic)    Anxiety    Arthritis    endstage changes bilateral knees/bilateral ankles.    Asthma    Carotid artery occlusion    Chest pain of uncertain etiology 01/28/2012   Normal coronaries at cath     Chronic fatigue    Chronic kidney disease    Closed intertrochanteric fracture of hip, left, initial encounter (HCC) 11/29/2016   Closed left hip fracture (HCC)    Closed nondisplaced intertrochanteric fracture of left femur with delayed healing 01/14/2017   Clotting disorder (HCC)    pt denies this   Contusion of left knee    due to fall 1/14.   COPD (chronic obstructive pulmonary disease)  (HCC)    pulmonary fibrosis   Coronary artery disease    Depression, reactive    Diabetes mellitus    type II    Diastolic dysfunction    Difficulty in walking    Family history of heart disease    Flu 06/26/2023   Generalized muscle weakness    Gout    High cholesterol    History of falling    Hypertension    Hypothyroidism    Interstitial lung disease (HCC)    Meningioma of left sphenoid wing involving cavernous sinus (HCC) 02/17/2012   Continue diplopia, left eye pain and left headaches.     Morbid obesity (HCC)    Neuromuscular disorder (HCC)    diabetic neuropathy    Normal coronary arteries    cardiac catheterization performed  10/31/14   Pneumonia    RA (rheumatoid arthritis) (HCC)    has been off methotreaxte since 10/13.   Septic shock (HCC) 06/13/2023   Spinal stenosis of lumbar region    Thyroid disease    Unspecified lack of coordination    URI (upper respiratory infection)    UTI (urinary tract infection) 03/31/2021   11/12 - 04/03/2021 Klebsiella UTI associated with encephalopathy in the context of AKI.  Status post IV ceftriaxone followed by oral Keflex x48 hours.      Past Surgical History:  Procedure Laterality Date   BRAIN SURGERY     Gamma knife 10/13. Needs repeat spring  '14   CARDIAC CATHETERIZATION N/A 10/31/2014   Procedure: Right/Left Heart Cath and Coronary Angiography;  Surgeon: Corky Crafts, MD;  Location: [facility] INVASIVE CV LAB;  Service: Cardiovascular;  Laterality: N/A;   COLONOSCOPY WITH PROPOFOL N/A 04/14/2022   Procedure: COLONOSCOPY WITH PROPOFOL;  Surgeon: Beverley Fiedler, MD;  Location: [facility] ENDOSCOPY;  Service: Gastroenterology;  Laterality: N/A;   CYST EXCISION     2 on face   CYST REMOVAL NECK     ESOPHAGOGASTRODUODENOSCOPY (EGD) WITH PROPOFOL N/A 09/14/2014   Procedure: ESOPHAGOGASTRODUODENOSCOPY (EGD) WITH PROPOFOL;  Surgeon: Louis Meckel, MD;  Location: [facility] ENDOSCOPY;  Service: Endoscopy;  Laterality: N/A;   INCISION AND  DRAINAGE HIP Left 01/16/2017   Procedure: IRRIGATION AND DEBRIDEMENT LEFT HIP;  Surgeon: Kathryne Hitch, MD;  Location: [facility] ORS;  Service: Orthopedics;  Laterality: Left;   INTRAMEDULLARY (IM) NAIL INTERTROCHANTERIC Left 11/29/2016   Procedure: INTRAMEDULLARY (IM) NAIL INTERTROCHANTRIC;  Surgeon: Kathryne Hitch, MD;  Location: [facility] OR;  Service: Orthopedics;  Laterality: Left;   OVARY SURGERY     RIGHT HEART CATH N/A 08/12/2021   Procedure: RIGHT HEART CATH;  Surgeon: Runell Gess, MD;  Location: [facility] INVASIVE CV LAB;  Service: Cardiovascular;  Laterality: N/A;   SHOULDER SURGERY Left  TONSILLECTOMY  age 22   VAGINAL HYSTERECTOMY     VIDEO BRONCHOSCOPY Bilateral 05/31/2013   Procedure: VIDEO BRONCHOSCOPY WITHOUT FLUORO;  Surgeon: Kalman Shan, MD;  Location: [facility] ENDOSCOPY;  Service: Cardiopulmonary;  Laterality: Bilateral;   video bronscoscopy  05/19/1998   lung    Social History:  reports that she has never smoked. She has never used smokeless tobacco. She reports that she does not drink alcohol and does not use drugs.  Allergies:  Allergies  Allergen Reactions   Codeine Swelling and Other (See Comments)    Facial swelling, Chest pain, and swelling in legs    Infliximab Anaphylaxis    (REMICADE) "sent me into shock"    Lisinopril Swelling and Rash    Face and neck swelling    Ofev [Nintedanib] Diarrhea    Prior to Admission medications   Medication Sig Start Date End Date Taking? Authorizing Provider  albuterol (VENTOLIN HFA) 108 (90 Base) MCG/ACT inhaler Inhale 2 puffs into the lungs as needed for wheezing or shortness of breath.   Yes [provider]  Artificial Tear Ointment (DRY EYES OP) Place 2 drops into both eyes as needed (dry eyes).   Yes [provider]  atorvastatin (LIPITOR) 10 MG tablet Take 1 tablet (10 mg total) by mouth daily. 08/14/22  Yes [name], MD  azithromycin (ZITHROMAX) 250 MG tablet Take 1 tablet (250 mg  total) by mouth every Monday, Wednesday, and Friday. Take every Monday, Wednesday, Friday. 06/03/23 06/02/24 Yes Kalman Shan, MD  Calcium Carb-Cholecalciferol (CALCIUM + D3 PO) Take 2 tablets by mouth daily.   Yes [provider]  Camphor-Menthol-Methyl Sal (SALONPAS EX) Apply 1 patch topically as needed (shoulder and leg pain).   Yes [provider]  Cholecalciferol (VITAMIN D3) 50 MCG (2000 UT) capsule Take 2,000 Units by mouth daily.   Yes [provider]  diclofenac Sodium (VOLTAREN) 1 % GEL Apply 2 g topically 3 (three) times daily. Apply to both hands and knees Patient taking differently: Apply 2 g topically in the morning, at noon, and at bedtime. Apply to both hands and knees. 05/03/21  Yes Medina-Vargas, Monina C, NP  dicyclomine (BENTYL) 20 MG tablet Take 1 tablet (20 mg total) by mouth 4 (four) times daily -  before meals and at bedtime. 07/10/23  Yes Sreenath, Sudheer B, MD  diphenoxylate-atropine (LOMOTIL) 2.5-0.025 MG tablet Take 1 tablet by mouth 4 (four) times daily as needed for diarrhea or loose stools. 07/09/23  Yes Sreenath, Sudheer B, MD  docusate sodium (COLACE) 100 MG capsule Take 2 capsules (200 mg total) by mouth 2 (two) times daily. Hold for diarrhea. 06/26/23  Yes Elgergawy, Leana Roe, MD  DULoxetine (CYMBALTA) 60 MG capsule Take 1 capsule (60 mg total) by mouth daily. 11/27/22  Yes Jones Bales, NP  ferrous sulfate 324 MG TBEC Take 324 mg by mouth every evening.   Yes [provider]  fluticasone (FLONASE) 50 MCG/ACT nasal spray Place 1 spray into both nostrils daily. 11/28/22  Yes [name], MD  furosemide (LASIX) 20 MG tablet Take 3 tablets (60 mg total) by mouth daily. Patient taking differently: Take 20 mg by mouth daily. 06/27/23  Yes Elgergawy, Leana Roe, MD  gabapentin (NEURONTIN) 600 MG tablet Take 0.5 tablets (300 mg total) by mouth 2 (two) times daily. 06/26/23  Yes Elgergawy, Leana Roe, MD  guaiFENesin (MUCINEX) 600 MG 12 hr  tablet Take 2 tablets (1,200 mg total) by mouth 2 (two) times daily. 05/03/21  Yes Medina-Vargas, Monina C, NP  hydrALAZINE (APRESOLINE) 25 MG tablet Take 25 mg by mouth 2 (two) times daily.   Yes [provider]  hydroxychloroquine (PLAQUENIL) 200 MG tablet Take 1 tablet (200 mg total) by mouth daily. 05/03/21  Yes Medina-Vargas, Monina C, NP  leflunomide (ARAVA) 20 MG tablet Take 1 tablet (20 mg total) by mouth daily. 05/03/21 08/09/23 Yes Medina-Vargas, Monina C, NP  levothyroxine (SYNTHROID) 50 MCG tablet Take 1 tablet (50 mcg total) by mouth daily. 11/28/22  Yes [name], MD  lidocaine (LIDODERM) 5 % Place 1 patch onto the skin daily. Remove & Discard patch within 12 hours or as directed by MD 12/24/22  Yes [name], MD  linaclotide (LINZESS) 72 MCG capsule Take 72 mcg by mouth daily before breakfast.   Yes [provider]  metolazone (ZAROXOLYN) 2.5 MG tablet TAKE 1 TABLET BY MOUTH DAILY AS NEEDED FOR 2 DAYS FOR WEIGHT ABOVE 255 POUNDS 05/03/21  Yes Medina-Vargas, Monina C, NP  metoprolol succinate (TOPROL-XL) 50 MG 24 hr tablet Take 1 tablet (50 mg total) by mouth daily. Take with or immediately following a meal. 07/10/23  Yes Sreenath, Sudheer B, MD  morphine (MS CONTIN) 15 MG 12 hr tablet Take 1 tablet (15 mg total) by mouth every 12 (twelve) hours. SNF use only 07/09/23  Yes Sreenath, Sudheer B, MD  Multiple Vitamin (MULTIVITAMIN WITH MINERALS) TABS Take 1 tablet by mouth daily.    Yes [provider]  olmesartan (BENICAR) 40 MG tablet Take 40 mg by mouth daily.   Yes [provider]  ondansetron (ZOFRAN-ODT) 4 MG disintegrating tablet Take 1 tablet (4 mg total) by mouth every 8 (eight) hours as needed for nausea or vomiting. 06/12/23  Yes [name], MD  oxyCODONE (OXY IR/ROXICODONE) 5 MG immediate release tablet Take 1 tablet (5 mg total) by mouth every 6 (six) hours as needed for moderate pain (pain score 4-6). SNF use only 07/09/23  Yes  Sreenath, Sudheer B, MD  pantoprazole (PROTONIX) 40 MG tablet Take 1 tablet (40 mg total) by mouth 2 (two) times daily. 06/23/23  Yes [name], MD  polyethylene glycol (MIRALAX / GLYCOLAX) 17 g packet Take 17 g by mouth 2 (two) times daily. Hold for diarrhea 06/26/23  Yes Elgergawy, Leana Roe, MD  Potassium Chloride ER 20 MEQ TBCR Please take 20 mg p.o. twice daily for first 3 days, then go back to 20 mg oral daily, monitor frequently and adjust as needed Patient taking differently: Take 20 mEq by mouth daily. Please take 20 mg p.o. twice daily for first 3 days, then go back to 20 mg oral daily, monitor frequently and adjust as needed 06/26/23  Yes Elgergawy, Leana Roe, MD  predniSONE (DELTASONE) 10 MG tablet Take 1 tablet (10 mg total) by mouth daily with breakfast. 05/22/21  Yes [name], MD  Semaglutide,0.25 or 0.5MG /DOS, (OZEMPIC, 0.25 OR 0.5 MG/DOSE,) 2 MG/1.5ML SOPN Inject 0.5 mg into the skin once a week.   Yes [provider]  simethicone (MYLICON) 80 MG chewable tablet Chew 1 tablet (80 mg total) by mouth every 6 (six) hours as needed for flatulence. 06/26/23  Yes Elgergawy, Leana Roe, MD  zinc oxide 20 % ointment Apply 1 Application topically See admin instructions. Every shift. Apply to MASD to left buttocks and with each incontinent episode.   Yes [provider]  allopurinol (ZYLOPRIM) 300 MG tablet Take 1 tablet (300 mg total) by mouth daily. Patient  not taking: Reported on 07/15/2023 05/03/21   Medina-Vargas, Monina C, NP  colchicine 0.6 MG tablet Take 1 tablet (0.6 mg total) by mouth daily as needed (gout flares). Patient not taking: Reported on 07/15/2023 05/03/21   Medina-Vargas, Monina C, NP  OXYGEN Inhale 3 L into the lungs See admin instructions. 3 L at bedtime, and 3 L during the day as needed for exertion    [provider]      Blood pressure (!) 160/98, pulse (!) 110, temperature 99 F (37.2 C), temperature source Oral, resp. rate 15, SpO2  100%. Physical Exam: General: pleasant, WD, female who is laying in bed in NAD HEENT: head is normocephalic, atraumatic.  Sclera are noninjected.  Pupils equal and round. EOMs intact.  Ears and nose without any masses or lesions.  Mouth is pink and moist Heart: regular, rate, and rhythm.  Normal s1,s2. No obvious murmurs, gallops, or rubs noted.  Palpable radial and pedal pulses bilaterally Lungs: CTAB, no wheezes, rhonchi, or rales noted.  Respiratory effort nonlabored Abd: distended, not rigid; diffuse mild tenderness secondary to distention MSK: all 4 extremities are symmetrical with no cyanosis, clubbing, or edema. Skin: warm and dry with no masses, lesions, or rashes Neuro: Cranial nerves 2-12 grossly intact, sensation is normal throughout Psych: A&Ox3 with an appropriate affect.    Results for orders placed or performed during the hospital encounter of 07/15/23 (from the past 48 hours)  Type and screen [facility]     Status: None   Collection Time: 07/15/23 12:00 PM  Result Value Ref Range   ABO/RH(D) O POS    Antibody Screen NEG    Sample Expiration      07/18/2023,2359 Performed at [facility], [address], [place], [place] [address]   CBC with Differential     Status: Abnormal   Collection Time: 07/15/23 12:09 PM  Result Value Ref Range   WBC 11.9 (H) 4.0 - 10.5 K/uL   RBC 3.88 3.87 - 5.11 MIL/uL   Hemoglobin 11.8 (L) 12.0 - 15.0 g/dL   HCT 60.4 54.0 - 98.1 %   MCV 99.7 80.0 - 100.0 fL   MCH 30.4 26.0 - 34.0 pg   MCHC 30.5 30.0 - 36.0 g/dL   RDW 19.1 (H) 47.8 - 29.5 %   Platelets 207 150 - 400 K/uL   nRBC 0.0 0.0 - 0.2 %   Neutrophils Relative % 33 %   Neutro Abs 3.9 1.7 - 7.7 K/uL   Lymphocytes Relative 53 %   Lymphs Abs 6.3 (H) 0.7 - 4.0 K/uL   Monocytes Relative 12 %   Monocytes Absolute 1.4 (H) 0.1 - 1.0 K/uL   Eosinophils Relative 1 %   Eosinophils Absolute 0.2 0.0 - 0.5 K/uL   Basophils Relative 1 %   Basophils Absolute 0.1 0.0 -  0.1 K/uL   Immature Granulocytes 0 %   Abs Immature Granulocytes 0.05 0.00 - 0.07 K/uL    Comment: Performed at [facility], [address] [place], [place] [address]  Comprehensive metabolic panel     Status: Abnormal   Collection Time: 07/15/23 12:09 PM  Result Value Ref Range   Sodium 140 135 - 145 mmol/L   Potassium 3.4 (L) 3.5 - 5.1 mmol/L   Chloride 105 98 - 111 mmol/L   CO2 21 (L) 22 - 32 mmol/L   Glucose, Bld 100 (H) 70 - 99 mg/dL    Comment: Glucose reference range applies only  to samples taken after fasting for at least 8 hours.   BUN 17 8 - 23 mg/dL   Creatinine, Ser 4.09 (H) 0.44 - 1.00 mg/dL   Calcium 8.3 (L) 8.9 - 10.3 mg/dL   Total Protein 5.2 (L) 6.5 - 8.1 g/dL   Albumin 2.5 (L) 3.5 - 5.0 g/dL   AST 15 15 - 41 U/L   ALT 11 0 - 44 U/L   Alkaline Phosphatase 45 38 - 126 U/L   Total Bilirubin 0.6 0.0 - 1.2 mg/dL   GFR, Estimated 51 (L) >60 mL/min    Comment: (NOTE) Calculated using the CKD-EPI Creatinine Equation (2021)    Anion gap 14 5 - 15    Comment: Performed at [facility], [address] [place], [place] [address]  Lipase, blood     Status: None   Collection Time: 07/15/23 12:09 PM  Result Value Ref Range   Lipase 20 11 - 51 U/L    Comment: Performed at [facility], [address] [place], [place] [address]  I-stat chem 8, ED     Status: Abnormal   Collection Time: 07/15/23 12:15 PM  Result Value Ref Range   Sodium 140 135 - 145 mmol/L   Potassium 3.3 (L) 3.5 - 5.1 mmol/L   Chloride 106 98 - 111 mmol/L   BUN 18 8 - 23 mg/dL   Creatinine, Ser 5.62 (H) 0.44 - 1.00 mg/dL   Glucose, Bld 130 (H) 70 - 99 mg/dL    Comment: Glucose reference range applies only to samples taken after fasting for at least 8 hours.   Calcium, Ion 0.94 (L) 1.15 - 1.40 mmol/L   TCO2 24 22 - 32 mmol/L   Hemoglobin 12.9 12.0 - 15.0 g/dL   HCT 86.5 78.4 - 69.6 %  I-Stat Lactic Acid     Status: None   Collection Time: 07/15/23 12:16 PM  Result Value Ref  Range   Lactic Acid, Venous 1.6 0.5 - 1.9 mmol/L  POC occult blood, ED Provider will collect     Status: Abnormal   Collection Time: 07/15/23 12:38 PM  Result Value Ref Range   Fecal Occult Bld POSITIVE (A) NEGATIVE  I-Stat Lactic Acid     Status: Abnormal   Collection Time: 07/15/23  2:44 PM  Result Value Ref Range   Lactic Acid, Venous 3.1 (HH) 0.5 - 1.9 mmol/L   Comment NOTIFIED PHYSICIAN   Culture, blood (routine x 2)     Status: None (Preliminary result)   Collection Time: 07/15/23  3:00 PM   Specimen: BLOOD  Result Value Ref Range   Specimen Description BLOOD LEFT ANTECUBITAL    Special Requests      BOTTLES DRAWN AEROBIC AND ANAEROBIC Blood Culture results may not be optimal due to an inadequate volume of blood received in culture bottles   Culture      NO GROWTH < 24 HOURS Performed at [facility], [address], [place], [place] [address]    Report Status PENDING   Culture, blood (routine x 2)     Status: None (Preliminary result)   Collection Time: 07/15/23  3:00 PM   Specimen: BLOOD  Result Value Ref Range   Specimen Description BLOOD RIGHT ANTECUBITAL    Special Requests      BOTTLES DRAWN AEROBIC AND ANAEROBIC Blood Culture results may not be optimal due to an inadequate volume of blood received in culture bottles   Culture  NO GROWTH < 24 HOURS Performed at [facility], [address] [place], [place] [address]    Report Status PENDING   Resp panel by RT-PCR (RSV, Flu A&B, Covid) Anterior Nasal Swab     Status: None   Collection Time: 07/15/23  4:16 PM   Specimen: Anterior Nasal Swab  Result Value Ref Range   SARS Coronavirus 2 by RT PCR NEGATIVE NEGATIVE   Influenza A by PCR NEGATIVE NEGATIVE   Influenza B by PCR NEGATIVE NEGATIVE    Comment: (NOTE) The Xpert Xpress SARS-CoV-2/FLU/RSV plus assay is intended as an aid in the diagnosis of influenza from Nasopharyngeal swab specimens and should not be used as a sole basis for treatment.  Nasal washings and aspirates are unacceptable for Xpert Xpress SARS-CoV-2/FLU/RSV testing.  Fact Sheet for Patients: BloggerCourse.com  Fact Sheet for Healthcare Providers: SeriousBroker.it  This test is not yet approved or cleared by the [place] FDA and has been authorized for detection and/or diagnosis of SARS-CoV-2 by FDA under an Emergency Use Authorization (EUA). This EUA will remain in effect (meaning this test can be used) for the duration of the COVID-19 declaration under Section 564(b)(1) of the Act, 21 U.S.C. section 360bbb-3(b)(1), unless the authorization is terminated or revoked.     Resp Syncytial Virus by PCR NEGATIVE NEGATIVE    Comment: (NOTE) Fact Sheet for Patients: BloggerCourse.com  Fact Sheet for Healthcare Providers: SeriousBroker.it  This test is not yet approved or cleared by the [place] FDA and has been authorized for detection and/or diagnosis of SARS-CoV-2 by FDA under an Emergency Use Authorization (EUA). This EUA will remain in effect (meaning this test can be used) for the duration of the COVID-19 declaration under Section 564(b)(1) of the Act, 21 U.S.C. section 360bbb-3(b)(1), unless the authorization is terminated or revoked.  Performed at [facility], [address], [place], [place] [address]   Urinalysis, w/ Reflex to Culture (Infection Suspected) -Urine, Catheterized     Status: Abnormal   Collection Time: 07/15/23  4:16 PM  Result Value Ref Range   Specimen Source URINE, CATHETERIZED    Color, Urine YELLOW YELLOW   APPearance HAZY (A) CLEAR   Specific Gravity, Urine >1.046 (H) 1.005 - 1.030   pH 5.0 5.0 - 8.0   Glucose, UA NEGATIVE NEGATIVE mg/dL   Hgb urine dipstick NEGATIVE NEGATIVE   Bilirubin Urine NEGATIVE NEGATIVE   Ketones, ur 5 (A) NEGATIVE mg/dL   Protein, ur NEGATIVE NEGATIVE mg/dL   Nitrite NEGATIVE  NEGATIVE   Leukocytes,Ua NEGATIVE NEGATIVE   RBC / HPF 0-5 0 - 5 RBC/hpf   WBC, UA 0-5 0 - 5 WBC/hpf    Comment:        Reflex urine culture not performed if WBC <=10, OR if Squamous epithelial cells >5. If Squamous epithelial cells >5 suggest recollection.    Bacteria, UA RARE (A) NONE SEEN   Squamous Epithelial / HPF 0-5 0 - 5 /HPF   Mucus PRESENT    Hyaline Casts, UA PRESENT     Comment: Performed at [facility], [address] [place], [place] [address]  Protime-INR     Status: Abnormal   Collection Time: 07/15/23  5:00 PM  Result Value Ref Range   Prothrombin Time 15.8 (H) 11.4 - 15.2 seconds   INR 1.2 0.8 - 1.2    Comment: (NOTE) INR goal varies based on device and disease states. Performed at [facility], [address], [place], [place] [address]   APTT     Status: None   Collection Time: 07/15/23  5:00 PM  Result Value Ref Range   aPTT 31 24 - 36 seconds    Comment: Performed at [facility], [address] [place], [place] [address]  I-Stat CG4 Lactic Acid     Status: None   Collection Time: 07/15/23  5:27 PM  Result Value Ref Range   Lactic Acid, Venous 1.2 0.5 - 1.9 mmol/L  CBC     Status: Abnormal   Collection Time: 07/15/23 11:55 PM  Result Value Ref Range   WBC 13.6 (H) 4.0 - 10.5 K/uL   RBC 3.71 (L) 3.87 - 5.11 MIL/uL   Hemoglobin 11.1 (L) 12.0 - 15.0 g/dL   HCT 62.1 30.8 - 65.7 %   MCV 100.3 (H) 80.0 - 100.0 fL   MCH 29.9 26.0 - 34.0 pg   MCHC 29.8 (L) 30.0 - 36.0 g/dL   RDW 84.6 (H) 96.2 - 95.2 %   Platelets 196 150 - 400 K/uL   nRBC 0.0 0.0 - 0.2 %    Comment: Performed at [facility], [address], [place], [place] [address]  Magnesium     Status: Abnormal   Collection Time: 07/15/23 11:55 PM  Result Value Ref Range   Magnesium 1.5 (L) 1.7 - 2.4 mg/dL    Comment: Performed at [facility], [address], [place], [place] [address]  CBC     Status: Abnormal   Collection Time: 07/16/23  3:37 AM  Result  Value Ref Range   WBC 12.7 (H) 4.0 - 10.5 K/uL   RBC 3.69 (L) 3.87 - 5.11 MIL/uL   Hemoglobin 11.1 (L) 12.0 - 15.0 g/dL   HCT 27.2 53.6 - 64.4 %   MCV 99.5 80.0 - 100.0 fL   MCH 30.1 26.0 - 34.0 pg   MCHC 30.2 30.0 - 36.0 g/dL   RDW 03.4 (H) 74.2 - 59.5 %   Platelets 200 150 - 400 K/uL   nRBC 0.0 0.0 - 0.2 %    Comment: Performed at [facility], [address] [place] [address] [place], [place] [address]  Comprehensive metabolic panel     Status: Abnormal   Collection Time: 07/16/23  3:37 AM  Result Value Ref Range   Sodium 140 135 - 145 mmol/L   Potassium 3.8 3.5 - 5.1 mmol/L   Chloride 108 98 - 111 mmol/L   CO2 21 (L) 22 - 32 mmol/L   Glucose, Bld 96 70 - 99 mg/dL    Comment: Glucose reference range applies only to samples taken after fasting for at least 8 hours.   BUN 15 8 - 23 mg/dL   Creatinine, Ser 6.43 0.44 - 1.00 mg/dL   Calcium 8.0 (L) 8.9 - 10.3 mg/dL   Total Protein 4.5 (L) 6.5 - 8.1 g/dL   Albumin 2.1 (L) 3.5 - 5.0 g/dL   AST 15 15 - 41 U/L   ALT 11 0 - 44 U/L   Alkaline Phosphatase 37 (L) 38 - 126 U/L   Total Bilirubin 0.7 0.0 - 1.2 mg/dL   GFR, Estimated >32 >95 mL/min    Comment: (NOTE) Calculated using the CKD-EPI Creatinine Equation (2021)    Anion gap 11 5 - 15    Comment: Performed at [facility], [address] [place], [place] [address]  Lactic acid, plasma     Status: None   Collection Time:  07/16/23  5:57 AM  Result Value Ref Range   Lactic Acid, Venous 1.0 0.5 - 1.9 mmol/L    Comment: Performed at [facility], [address], [place], [place] [address]  CBG monitoring, ED     Status: None   Collection Time: 07/16/23  7:59 AM  Result Value Ref Range   Glucose-Capillary 99 70 - 99 mg/dL    Comment: Glucose reference range applies only to samples taken after fasting for at least 8 hours.   *Note: Due to a large number of results and/or encounters for the requested time period, some results have not been displayed. A complete set of results can  be found in Results Review.   DG Chest Port 1 View Result Date: 07/15/2023 CLINICAL DATA:  Weakness. EXAM: PORTABLE CHEST 1 VIEW COMPARISON:  Chest radiograph dated 07/01/2023. FINDINGS: The heart size and mediastinal contours are within normal limits. Streaky bibasilar opacities, slightly increased at the right lung base, with background chronic interstitial changes. No sizable pleural effusion or pneumothorax. No acute osseous abnormality. IMPRESSION: Streaky bibasilar opacities, slightly increased on the right, could reflect atelectasis or infiltrate superimposed on chronic interstitial changes. Electronically Signed   By: Hart Robinsons M.D.   On: 07/15/2023 16:36   CT ABDOMEN PELVIS W CONTRAST Result Date: 07/15/2023 CLINICAL DATA:  Left lower quadrant abdominal pain. EXAM: CT ABDOMEN AND PELVIS WITH CONTRAST TECHNIQUE: Multidetector CT imaging of the abdomen and pelvis was performed using the standard protocol following bolus administration of intravenous contrast. RADIATION DOSE REDUCTION: This exam was performed according to the departmental dose-optimization program which includes automated exposure control, adjustment of the mA and/or kV according to patient size and/or use of iterative reconstruction technique. CONTRAST:  75mL OMNIPAQUE IOHEXOL 350 MG/ML SOLN COMPARISON:  July 05, 2023. FINDINGS: Lower chest: Stable probable bibasilar scarring is noted. Hepatobiliary: No focal liver abnormality is seen. No gallstones, gallbladder wall thickening, or biliary dilatation. Pancreas: Unremarkable. No pancreatic ductal dilatation or surrounding inflammatory changes. Spleen: Normal in size without focal abnormality. Adrenals/Urinary Tract: Adrenal glands appear normal. Stable bilateral renal cysts are noted for which no further follow-up is required. Minimal nonobstructive right nephrolithiasis is noted. No hydronephrosis or renal obstruction is noted. Urinary bladder is decompressed. Stomach/Bowel:  The stomach is unremarkable. No small bowel dilatation is noted. Stable colonic dilatation is noted. Mild to moderate wall thickening of proximal transverse colon is noted which may be due to lack of distension, but inflammatory or infectious colitis cannot be excluded. The appendix is not visualized. There is again noted abrupt caliber change involving the sigmoid colon which was present on prior exam, stricture cannot be excluded. Vascular/Lymphatic: Aortic atherosclerosis. No enlarged abdominal or pelvic lymph nodes. Reproductive: Status post hysterectomy. No adnexal masses. Other: No ascites or hernia is noted. Musculoskeletal: No acute osseous abnormality is noted. IMPRESSION: Stable probable bibasilar scarring is noted in the visualized lung bases. Minimal nonobstructive right nephrolithiasis. Continued dilatation of the colon is noted in most portions, most consistent with ileus. Mild to moderate wall thickening of proximal transverse colon is noted which may be due to lack of distension, but inflammatory or infectious colitis cannot be excluded. There is again noted abrupt caliber change involving the sigmoid colon which was present on prior exam, and stricture cannot be excluded. Aortic Atherosclerosis (ICD10-I70.0). Electronically Signed   By: Lupita Raider M.D.   On: 07/15/2023 15:36   CT Head Wo Contrast Result Date: 07/15/2023 CLINICAL DATA:  Provided history: Weakness. EXAM: CT  HEAD WITHOUT CONTRAST TECHNIQUE: Contiguous axial images were obtained from the base of the skull through the vertex without intravenous contrast. RADIATION DOSE REDUCTION: This exam was performed according to the departmental dose-optimization program which includes automated exposure control, adjustment of the mA and/or kV according to patient size and/or use of iterative reconstruction technique. COMPARISON:  Head CT 06/13/2023. FINDINGS: Brain: Cerebral atrophy. Patchy and ill-defined hypoattenuation within the cerebral  white matter, nonspecific but compatible with moderate chronic small vessel ischemic disease. There is no acute intracranial hemorrhage. No demarcated cortical infarct. No extra-axial fluid collection. No evidence of an intracranial mass. No midline shift. Vascular: No hyperdense vessel. Atherosclerotic calcifications. Skull: No calvarial fracture or aggressive osseous lesion. Sinuses/Orbits: No mass or acute finding within the imaged orbits. Minimal mucosal thickening within the bilateral ethmoid and right maxillary sinuses. Other: Small-volume fluid within left mastoid air cells. IMPRESSION: 1. No evidence of an acute intracranial abnormality. 2. Moderate chronic small vessel ischemic changes within the cerebral white matter. 3. Cerebral atrophy. 4. Small left mastoid effusion. Electronically Signed   By: Jackey Loge D.O.   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 She is at risk for perforation of her cecum, which is already very distended.  Despite the risks of surgery, I think she would benefit from a loop colostomy to divert stool and to decompress the distal colon above the stricture.  I explained to her the possibility of prolonged ventilation and wound healing issues due to her oxygen dependence and chronic steroids.  We discussed code status - she is still full code at this time.    She wants to think about it overnight and will let us know in the morning.  NPO after midnight in case she decides to proceed with surgery.   VTE: okay for chemical prophylaxis from surgical standpoint; no long-acting anticoagulation   Wilmon Arms. Corliss Skains, MD, [facility] Surgery  General Surgery   07/16/2023 4:42 PM

## 2023-07-16 NOTE — ED Notes (Signed)
 Secure chat sent to Dr.Segars regarding tachycardia. Pt currently resting, son at bedside

## 2023-07-16 NOTE — ED Notes (Signed)
 MD contacted due to patient blood pressure trending down. Patient's HR remains in 120's. MD at bedside to assess patient. See new orders.

## 2023-07-16 NOTE — ED Notes (Signed)
 Pt placed on hospital bed for comfort; resting at this time

## 2023-07-17 ENCOUNTER — Inpatient Hospital Stay (HOSPITAL_COMMUNITY): Payer: Medicare Other | Admitting: Anesthesiology

## 2023-07-17 ENCOUNTER — Encounter (HOSPITAL_COMMUNITY): Payer: Self-pay | Admitting: Internal Medicine

## 2023-07-17 ENCOUNTER — Other Ambulatory Visit: Payer: Self-pay

## 2023-07-17 ENCOUNTER — Telehealth: Payer: Medicare Other | Admitting: Adult Health

## 2023-07-17 ENCOUNTER — Encounter (HOSPITAL_COMMUNITY)
Admission: EM | Disposition: A | Discharge status: Rehabilitation Facility | Payer: Self-pay | Source: Skilled Nursing Facility | Attending: Internal Medicine

## 2023-07-17 DIAGNOSIS — K529 Noninfective gastroenteritis and colitis, unspecified: Secondary | ICD-10-CM | POA: Diagnosis not present

## 2023-07-17 DIAGNOSIS — I509 Heart failure, unspecified: Secondary | ICD-10-CM

## 2023-07-17 DIAGNOSIS — J9611 Chronic respiratory failure with hypoxia: Secondary | ICD-10-CM | POA: Diagnosis not present

## 2023-07-17 DIAGNOSIS — I2510 Atherosclerotic heart disease of native coronary artery without angina pectoris: Secondary | ICD-10-CM

## 2023-07-17 DIAGNOSIS — I110 Hypertensive heart disease with heart failure: Secondary | ICD-10-CM

## 2023-07-17 DIAGNOSIS — K56609 Unspecified intestinal obstruction, unspecified as to partial versus complete obstruction: Secondary | ICD-10-CM

## 2023-07-17 DIAGNOSIS — K219 Gastro-esophageal reflux disease without esophagitis: Secondary | ICD-10-CM | POA: Diagnosis not present

## 2023-07-17 DIAGNOSIS — E039 Hypothyroidism, unspecified: Secondary | ICD-10-CM | POA: Diagnosis not present

## 2023-07-17 HISTORY — PX: LAPAROSCOPIC LOOP COLOSTOMY: SHX6816

## 2023-07-17 LAB — BASIC METABOLIC PANEL
Anion gap: 12 (ref 5–15)
BUN: 14 mg/dL (ref 8–23)
CO2: 24 mmol/L (ref 22–32)
Calcium: 7.9 mg/dL — ABNORMAL LOW (ref 8.9–10.3)
Chloride: 105 mmol/L (ref 98–111)
Creatinine, Ser: 0.67 mg/dL (ref 0.44–1.00)
GFR, Estimated: >60 mL/min (ref >60)
Glucose, Bld: 73 mg/dL (ref 70–99)
Potassium: 3.2 mmol/L — ABNORMAL LOW (ref 3.5–5.1)
Sodium: 141 mmol/L (ref 135–145)

## 2023-07-17 LAB — CBC WITH DIFFERENTIAL/PLATELET
Abs Immature Granulocytes: 0.02 10*3/uL (ref 0.00–0.07)
Basophils Absolute: 0 10*3/uL (ref 0.0–0.1)
Basophils Relative: 0 %
Eosinophils Absolute: 0.2 10*3/uL (ref 0.0–0.5)
Eosinophils Relative: 2 %
HCT: 30.1 % — ABNORMAL LOW (ref 36.0–46.0)
Hemoglobin: 9.3 g/dL — ABNORMAL LOW (ref 12.0–15.0)
Immature Granulocytes: 0 %
Lymphocytes Relative: 60 %
Lymphs Abs: 4.4 10*3/uL — ABNORMAL HIGH (ref 0.7–4.0)
MCH: 30.2 pg (ref 26.0–34.0)
MCHC: 30.9 g/dL (ref 30.0–36.0)
MCV: 97.7 fL (ref 80.0–100.0)
Monocytes Absolute: 0.9 10*3/uL (ref 0.1–1.0)
Monocytes Relative: 11 %
Neutro Abs: 2 10*3/uL (ref 1.7–7.7)
Neutrophils Relative %: 27 %
Platelets: 168 10*3/uL (ref 150–400)
RBC: 3.08 MIL/uL — ABNORMAL LOW (ref 3.87–5.11)
RDW: 16.4 % — ABNORMAL HIGH (ref 11.5–15.5)
WBC: 7.4 10*3/uL (ref 4.0–10.5)
nRBC: 0 % (ref 0.0–0.2)

## 2023-07-17 LAB — RENAL FUNCTION PANEL
Albumin: 2.1 g/dL — ABNORMAL LOW (ref 3.5–5.0)
Anion gap: 9 (ref 5–15)
BUN: 13 mg/dL (ref 8–23)
CO2: 25 mmol/L (ref 22–32)
Calcium: 7.9 mg/dL — ABNORMAL LOW (ref 8.9–10.3)
Chloride: 107 mmol/L (ref 98–111)
Creatinine, Ser: 0.65 mg/dL (ref 0.44–1.00)
GFR, Estimated: >60 mL/min (ref >60)
Glucose, Bld: 77 mg/dL (ref 70–99)
Phosphorus: 2.1 mg/dL — ABNORMAL LOW (ref 2.5–4.6)
Potassium: 3.2 mmol/L — ABNORMAL LOW (ref 3.5–5.1)
Sodium: 141 mmol/L (ref 135–145)

## 2023-07-17 LAB — GLUCOSE, CAPILLARY
Glucose-Capillary: 64 mg/dL — ABNORMAL LOW (ref 70–99)
Glucose-Capillary: 110 mg/dL — ABNORMAL HIGH (ref 70–99)
Glucose-Capillary: 136 mg/dL — ABNORMAL HIGH (ref 70–99)
Glucose-Capillary: 155 mg/dL — ABNORMAL HIGH (ref 70–99)

## 2023-07-17 LAB — MAGNESIUM: Magnesium: 1.8 mg/dL (ref 1.7–2.4)

## 2023-07-17 SURGERY — CREATION, COLOSTOMY, LOOP, LAPAROSCOPIC
Anesthesia: General

## 2023-07-17 MED ORDER — LACTATED RINGERS IV SOLN: INTRAVENOUS | Status: DC

## 2023-07-17 MED ORDER — BOOST / RESOURCE BREEZE PO LIQD CUSTOM
1.0000 | Freq: Three times a day (TID) | ORAL | Status: DC
  Administered 2023-07-17 – 2023-07-20 (×7): 1 via ORAL

## 2023-07-17 MED ORDER — METHOCARBAMOL 500 MG PO TABS
500.0000 mg | ORAL_TABLET | Freq: Four times a day (QID) | ORAL | Status: DC | PRN
  Administered 2023-07-19 – 2023-07-20 (×2): 500 mg via ORAL
  Filled 2023-07-17 (×2): qty 1

## 2023-07-17 MED ORDER — LIDOCAINE 2% (20 MG/ML) 5 ML SYRINGE
INTRAMUSCULAR | Status: DC | PRN
  Administered 2023-07-17: 100 mg via INTRAVENOUS

## 2023-07-17 MED ORDER — CHLORHEXIDINE GLUCONATE 0.12 % MT SOLN: 15.0000 mL | Freq: Once | OROMUCOSAL | Status: AC

## 2023-07-17 MED ORDER — FENTANYL CITRATE (PF) 250 MCG/5ML IJ SOLN
INTRAMUSCULAR | Status: DC | PRN
Start: 2023-07-17 — End: 2023-07-17
  Administered 2023-07-17: 50 ug via INTRAVENOUS
  Administered 2023-07-17: 100 ug via INTRAVENOUS

## 2023-07-17 MED ORDER — ORAL CARE MOUTH RINSE: 15.0000 mL | Freq: Once | OROMUCOSAL | Status: AC

## 2023-07-17 MED ORDER — DEXAMETHASONE SODIUM PHOSPHATE 10 MG/ML IJ SOLN
INTRAMUSCULAR | Status: AC
Start: 2023-07-17 — End: ?
  Filled 2023-07-17: qty 1

## 2023-07-17 MED ORDER — ESMOLOL HCL 100 MG/10ML IV SOLN
INTRAVENOUS | Status: AC
  Filled 2023-07-17: qty 10

## 2023-07-17 MED ORDER — PROPOFOL 10 MG/ML IV BOLUS
INTRAVENOUS | Status: DC | PRN
  Administered 2023-07-17: 150 mg via INTRAVENOUS
  Administered 2023-07-17: 50 mg via INTRAVENOUS

## 2023-07-17 MED ORDER — FENTANYL CITRATE (PF) 250 MCG/5ML IJ SOLN
INTRAMUSCULAR | Status: AC
  Filled 2023-07-17: qty 5

## 2023-07-17 MED ORDER — CHLORHEXIDINE GLUCONATE 0.12 % MT SOLN
OROMUCOSAL | Status: AC
Start: 2023-07-17 — End: 2023-07-17
  Administered 2023-07-17: 15 mL via OROMUCOSAL
  Filled 2023-07-17: qty 15

## 2023-07-17 MED ORDER — DEXMEDETOMIDINE HCL IN NACL 80 MCG/20ML IV SOLN
INTRAVENOUS | Status: DC | PRN
  Administered 2023-07-17: 40 ug via INTRAVENOUS

## 2023-07-17 MED ORDER — MENTHOL 3 MG MT LOZG
1.0000 | LOZENGE | OROMUCOSAL | Status: DC | PRN
  Administered 2023-07-17 – 2023-07-18 (×3): 3 mg via ORAL
  Filled 2023-07-17 (×3): qty 9

## 2023-07-17 MED ORDER — ROCURONIUM BROMIDE 10 MG/ML (PF) SYRINGE
PREFILLED_SYRINGE | INTRAVENOUS | Status: DC | PRN
  Administered 2023-07-17: 10 mg via INTRAVENOUS
  Administered 2023-07-17: 30 mg via INTRAVENOUS
  Administered 2023-07-17: 10 mg via INTRAVENOUS

## 2023-07-17 MED ORDER — ALBUMIN HUMAN 5 % IV SOLN: INTRAVENOUS | Status: DC | PRN

## 2023-07-17 MED ORDER — PHENYLEPHRINE 80 MCG/ML (10ML) SYRINGE FOR IV PUSH (FOR BLOOD PRESSURE SUPPORT)
PREFILLED_SYRINGE | INTRAVENOUS | Status: DC | PRN
  Administered 2023-07-17: 160 ug via INTRAVENOUS
  Administered 2023-07-17: 240 ug via INTRAVENOUS

## 2023-07-17 MED ORDER — DEXAMETHASONE SODIUM PHOSPHATE 10 MG/ML IJ SOLN
INTRAMUSCULAR | Status: DC | PRN
  Administered 2023-07-17: 10 mg via INTRAVENOUS

## 2023-07-17 MED ORDER — SUCCINYLCHOLINE CHLORIDE 200 MG/10ML IV SOSY
PREFILLED_SYRINGE | INTRAVENOUS | Status: DC | PRN
  Administered 2023-07-17: 120 mg via INTRAVENOUS

## 2023-07-17 MED ORDER — DROPERIDOL 2.5 MG/ML IJ SOLN: 0.6250 mg | Freq: Once | INTRAMUSCULAR | Status: DC | PRN

## 2023-07-17 MED ORDER — DEXTROSE-SODIUM CHLORIDE 5-0.9 % IV SOLN
INTRAVENOUS | Status: AC
Start: 2023-07-17 — End: 2023-07-18

## 2023-07-17 MED ORDER — BUPIVACAINE-EPINEPHRINE (PF) 0.25% -1:200000 IJ SOLN
INTRAMUSCULAR | Status: AC
  Filled 2023-07-17: qty 30

## 2023-07-17 MED ORDER — ACETAMINOPHEN 650 MG RE SUPP: 650.0000 mg | Freq: Four times a day (QID) | RECTAL | Status: DC | PRN

## 2023-07-17 MED ORDER — OXYCODONE HCL 5 MG PO TABS
5.0000 mg | ORAL_TABLET | Freq: Four times a day (QID) | ORAL | Status: DC | PRN
  Administered 2023-07-18 – 2023-07-20 (×4): 5 mg via ORAL
  Filled 2023-07-17 (×4): qty 1

## 2023-07-17 MED ORDER — SUGAMMADEX SODIUM 200 MG/2ML IV SOLN
INTRAVENOUS | Status: DC | PRN
  Administered 2023-07-17: 100 mg via INTRAVENOUS

## 2023-07-17 MED ORDER — CALCIUM CHLORIDE 10 % IV SOLN
INTRAVENOUS | Status: DC | PRN
  Administered 2023-07-17: 500 mg via INTRAVENOUS

## 2023-07-17 MED ORDER — SODIUM CHLORIDE 0.9 % IR SOLN
Status: DC | PRN
  Administered 2023-07-17: 1000 mL

## 2023-07-17 MED ORDER — LACTATED RINGERS IV SOLN: INTRAVENOUS | Status: DC | PRN

## 2023-07-17 MED ORDER — ACETAMINOPHEN 500 MG PO TABS
1000.0000 mg | ORAL_TABLET | Freq: Four times a day (QID) | ORAL | Status: DC | PRN
  Administered 2023-07-18 – 2023-07-20 (×5): 1000 mg via ORAL
  Filled 2023-07-17 (×5): qty 2

## 2023-07-17 MED ORDER — HYDROMORPHONE HCL 1 MG/ML IJ SOLN
0.2500 mg | INTRAMUSCULAR | Status: DC | PRN
  Administered 2023-07-17 (×2): 0.5 mg via INTRAVENOUS

## 2023-07-17 MED ORDER — POTASSIUM CHLORIDE 10 MEQ/100ML IV SOLN
10.0000 meq | INTRAVENOUS | Status: AC
  Administered 2023-07-17: 10 meq via INTRAVENOUS
  Filled 2023-07-17 (×2): qty 100

## 2023-07-17 MED ORDER — PROPOFOL 10 MG/ML IV BOLUS
INTRAVENOUS | Status: AC
  Filled 2023-07-17: qty 20

## 2023-07-17 MED ORDER — ESMOLOL HCL 100 MG/10ML IV SOLN
INTRAVENOUS | Status: DC | PRN
Start: 2023-07-17 — End: 2023-07-17
  Administered 2023-07-17: 50 mg via INTRAVENOUS

## 2023-07-17 MED ORDER — DEXMEDETOMIDINE HCL IN NACL 80 MCG/20ML IV SOLN
INTRAVENOUS | Status: AC
  Filled 2023-07-17: qty 20

## 2023-07-17 MED ORDER — HYDROMORPHONE HCL 1 MG/ML IJ SOLN
INTRAMUSCULAR | Status: AC
  Filled 2023-07-17: qty 1

## 2023-07-17 MED ORDER — BUPIVACAINE-EPINEPHRINE (PF) 0.25% -1:200000 IJ SOLN
INTRAMUSCULAR | Status: DC | PRN
  Administered 2023-07-17: 7 mL via PERINEURAL

## 2023-07-17 MED ORDER — MIDAZOLAM HCL 2 MG/2ML IJ SOLN
INTRAMUSCULAR | Status: AC
  Filled 2023-07-17: qty 2

## 2023-07-17 MED ORDER — MORPHINE SULFATE (PF) 2 MG/ML IV SOLN
1.0000 mg | INTRAVENOUS | Status: DC | PRN
  Administered 2023-07-20: 1 mg via INTRAVENOUS
  Filled 2023-07-17: qty 1

## 2023-07-17 SURGICAL SUPPLY — 51 items
APPLIER CLIP ROT 10 11.4 M/L (STAPLE) ×1 IMPLANT
BAG COUNTER SPONGE SURGICOUNT (BAG) ×1 IMPLANT
CANISTER SUCT 3000ML PPV (MISCELLANEOUS) ×1 IMPLANT
CHLORAPREP W/TINT 26 (MISCELLANEOUS) ×1 IMPLANT
CLIP APPLIE ROT 10 11.4 M/L (STAPLE) IMPLANT
COVER MAYO STAND STRL (DRAPES) ×2 IMPLANT
COVER SURGICAL LIGHT HANDLE (MISCELLANEOUS) ×1 IMPLANT
DERMABOND ADVANCED .7 DNX12 (GAUZE/BANDAGES/DRESSINGS) IMPLANT
DRAPE HALF SHEET 40X57 (DRAPES) ×2 IMPLANT
DRAPE UTILITY XL STRL (DRAPES) ×1 IMPLANT
DRSG TEGADERM 2-3/8X2-3/4 SM (GAUZE/BANDAGES/DRESSINGS) IMPLANT
DRSG TEGADERM 4X4.75 (GAUZE/BANDAGES/DRESSINGS) IMPLANT
ELECT BLADE 6.5 EXT (BLADE) ×1 IMPLANT
ELECT CAUTERY BLADE 6.4 (BLADE) ×1 IMPLANT
ELECT REM PT RETURN 9FT ADLT (ELECTROSURGICAL) ×1 IMPLANT
ELECTRODE REM PT RTRN 9FT ADLT (ELECTROSURGICAL) ×1 IMPLANT
GAUZE SPONGE 2X2 STRL 8-PLY (GAUZE/BANDAGES/DRESSINGS) IMPLANT
GLOVE BIO SURGEON STRL SZ7 (GLOVE) ×2 IMPLANT
GLOVE BIOGEL PI IND STRL 7.5 (GLOVE) ×2 IMPLANT
GOWN STRL REUS W/ TWL LRG LVL3 (GOWN DISPOSABLE) ×6 IMPLANT
HANDLE SUCTION POOLE (INSTRUMENTS) IMPLANT
KIT BASIN OR (CUSTOM PROCEDURE TRAY) ×1 IMPLANT
KIT OSTOMY DRAINABLE 2.75 STR (WOUND CARE) IMPLANT
KIT TURNOVER KIT B (KITS) ×1 IMPLANT
NS IRRIG 1000ML POUR BTL (IV SOLUTION) ×2 IMPLANT
PAD ARMBOARD 7.5X6 YLW CONV (MISCELLANEOUS) ×2 IMPLANT
PENCIL BUTTON HOLSTER BLD 10FT (ELECTRODE) ×2 IMPLANT
SET TUBE SMOKE EVAC HIGH FLOW (TUBING) ×1 IMPLANT
SLEEVE Z-THREAD 5X100MM (TROCAR) ×1 IMPLANT
SPECIMEN JAR LARGE (MISCELLANEOUS) ×1 IMPLANT
SPIKE FLUID TRANSFER (MISCELLANEOUS) ×1 IMPLANT
STAPLER VISISTAT 35W (STAPLE) ×1 IMPLANT
STRIP CLOSURE SKIN 1/2X4 (GAUZE/BANDAGES/DRESSINGS) IMPLANT
SUCTION POOLE HANDLE (INSTRUMENTS) ×1 IMPLANT
SUT MNCRL AB 4-0 PS2 18 (SUTURE) IMPLANT
SUT SILK 2 0 SH CR/8 (SUTURE) ×1 IMPLANT
SUT SILK 2 0 TIES 10X30 (SUTURE) ×1 IMPLANT
SUT SILK 3 0 SH CR/8 (SUTURE) ×1 IMPLANT
SUT SILK 3 0 TIES 10X30 (SUTURE) ×1 IMPLANT
SUT VIC AB 3-0 SH 18 (SUTURE) IMPLANT
SUT VICRYL 0 UR6 27IN ABS (SUTURE) IMPLANT
SYR BULB IRRIG 60ML STRL (SYRINGE) ×2 IMPLANT
TOWEL GREEN STERILE (TOWEL DISPOSABLE) ×2 IMPLANT
TRAY FOLEY MTR SLVR 14FR STAT (SET/KITS/TRAYS/PACK) ×1 IMPLANT
TRAY LAPAROSCOPIC MC (CUSTOM PROCEDURE TRAY) ×1 IMPLANT
TROCAR BALLN 12MMX100 BLUNT (TROCAR) IMPLANT
TROCAR Z-THREAD OPTICAL 5X100M (TROCAR) ×1 IMPLANT
TUBE CONNECTING 12X1/4 (SUCTIONS) ×2 IMPLANT
WARMER LAPAROSCOPE (MISCELLANEOUS) ×1 IMPLANT
WATER STERILE IRR 1000ML POUR (IV SOLUTION) ×1 IMPLANT
YANKAUER SUCT BULB TIP NO VENT (SUCTIONS) ×2 IMPLANT

## 2023-07-17 NOTE — Anesthesia Postprocedure Evaluation (Signed)
 Anesthesia Post Note  Patient: [name]  Procedure(s) Performed: LAPAROSCOPIC DIVERTING LOOP COLOSTOMY     Patient location during evaluation: PACU Anesthesia Type: General Level of consciousness: awake and alert Pain management: pain level controlled Vital Signs Assessment: post-procedure vital signs reviewed and stable Respiratory status: spontaneous breathing, nonlabored ventilation and respiratory function stable Cardiovascular status: blood pressure returned to baseline Postop Assessment: no apparent nausea or vomiting Anesthetic complications: no   There were no known notable events for this encounter.  Last Vitals:  Vitals:   07/17/23 1600 07/17/23 1605  BP: (!) 140/79   Pulse: 95 95  Resp: 11 17  Temp: 36.6 C   SpO2: 98% 97%    Last Pain:  Vitals:   07/17/23 1545  TempSrc:   PainSc: 5                  Shanda Howells

## 2023-07-17 NOTE — Op Note (Signed)
 Pre-op diagnosis: Large bowel obstruction secondary to distal sigmoid stenosis Postop diagnosis: Same Procedure performed: Laparoscopic assisted diverting loop colostomy  Surgeon:Delmus Warwick K Caleesi Kohl Anesthesia: General Indications: This is a 83 year old female with multiple serious chronic medical issues who is on home oxygen and chronic steroids.  She has had multiple episodes of diverticulitis and has developed a very tight stricture in her sigmoid colon.  She was deemed to be high risk for resection.  However, she has become almost completely obstructed with a small amount of liquid diarrhea as her only GI output.  She has become very distended and uncomfortable.  She came to the emergency department yesterday.  We had a long discussion and I offered a laparoscopic assisted diverting loop colostomy.  The patient has consented to this procedure.  Description of procedure: The patient is brought to the operating room placed in the supine position on the operative room table.  After an adequate level general anesthesia was obtained, a Foley catheter was placed under sterile technique.  The patient's abdomen was prepped with ChloraPrep and draped sterile fashion.  Timeout was taken to ensure the proper patient and proper procedure.  We infiltrated the area below the umbilicus with quarter percent Marcaine.  We made a transverse incision.  Dissection was carried down the fascia with cautery.  We carefully incised the linea alba and entered the peritoneal cavity bluntly.  A stay suture of 0 Vicryl was placed around the fascial opening.  We inserted the Hassan cannula and secured it with a stay suture.  We insufflated CO2 maintaining a maximum pressure of 15 mmHg.  The laparoscope was inserted.  The patient has some very distended colon and some mildly distended small bowel.  We are able to find a clear area in the left upper quadrant.  I placed a 5 mm port here.  We placed another 5 mm port in the right upper  quadrant.  We moved the camera to the upper abdomen.  The transverse colon is mildly dilated.  The sigmoid colon is very redundant and the proximal portion of the sigmoid colon appears fairly dilated.  The transverse colon is quite mobile.  I grasped the distal transverse colon with a clamp and it was able to reach the anterior abdominal wall easily.  I placed a locking grasper through the left upper quadrant port site and grasped the transverse colon at the selected site.  We then released our insufflation.  I made a circular incision around our left upper quadrant port site.  We dissected down to the fascia which was incised in a cruciate fashion.  We opened the peritoneum and I attempted to exteriorize this loop of transverse colon.  The colon is very distended and I was unable to bring up the entire loop into the wound.  Therefore I made incision just open the front wall of the distended colon.  I made a small colotomy and we suctioned out the contents of the colon.  I then matured the colostomy with 3-0 Vicryl.  Upon completion of the maturation of the colostomy inserted a finger and I was able to probe both proximal and distal.  I had inserted the pull suction distally and evacuated approximately 500 cc of liquid stool with some decompression of the abdominal distention.  We attached an ostomy appliance.  Reinsufflated with CO2 and we inspected the colostomy laparoscopically.  There is no twist in the colon.  There is no active bleeding.  We released her insufflation and remove the  ports.  Both ports sites were closed with 4-0 Monocryl and sealed with Dermabond.  The patient is extubated and brought to the recovery room in stable condition.  All sponge, instrument, and needle counts are correct.  Wilmon Arms. Corliss Skains, MD, [facility] Surgery  General Surgery   07/17/2023 3:28 PM

## 2023-07-17 NOTE — TOC Initial Note (Addendum)
 Transition of Care [facility]) - Initial/Assessment Note    Patient Details  Name: [name] MRN: [id number] Date of Birth: [birth year]  Transition of Care [facility]) CM/SW Contact:    Mearl Latin, LCSW Phone Number: 07/17/2023, 3:30 PM  Clinical Narrative:                 Patient admitted from [facility] for rehab. CSW will continue to follow for medical stability.   Per [facility], family did not complete a bed hold and they gave patient's bed away but advised CSW to check back on Monday.  Expected Discharge Plan: Skilled Nursing Facility Barriers to Discharge: Continued Medical Work up   Patient Goals and CMS Choice            Expected Discharge Plan and Services In-house Referral: Clinical Social Work   Post Acute Care Choice: Skilled Nursing Facility Living arrangements for the past 2 months: Single Family Home                                      Prior Living Arrangements/Services Living arrangements for the past 2 months: Single Family Home Lives with:: Spouse Patient language and need for interpreter reviewed:: Yes Do you feel safe going back to the place where you live?: Yes      Need for Family Participation in Patient Care: Yes (Comment) Care giver support system in place?: Yes (comment) Current home services: DME Criminal Activity/Legal Involvement Pertinent to Current Situation/Hospitalization: No - Comment as needed  Activities of Daily Living   ADL Screening (condition at time of admission) Independently performs ADLs?: No Does the patient have a NEW difficulty with bathing/dressing/toileting/self-feeding that is expected to last >3 days?: No (needs assist) Does the patient have a NEW difficulty with getting in/out of bed, walking, or climbing stairs that is expected to last >3 days?: No (needs assist up with walker) Does the patient have a NEW difficulty with communication that is expected to last >3 days?: No Is the patient deaf  or have difficulty hearing?: No Does the patient have difficulty seeing, even when wearing glasses/contacts?: No Does the patient have difficulty concentrating, remembering, or making decisions?: Yes  Permission Sought/Granted Permission sought to share information with : Facility Medical sales representative, Family Supports Permission granted to share information with : Yes, Verbal Permission Granted     Permission granted to share info w AGENCY: SNF        Emotional Assessment Appearance:: Appears stated age     Orientation: : Oriented to Self, Oriented to Place, Oriented to  Time, Oriented to Situation Alcohol / Substance Use: Not Applicable Psych Involvement: No (comment)  Admission diagnosis:  Colitis [K52.9] Ileus (HCC) [K56.7] GI bleed [K92.2] Abdominal pain [R10.9] Septic shock (HCC) [A41.9, R65.21] Gastrointestinal hemorrhage with melena [K92.1] Pneumonia of both lower lobes due to infectious organism [J18.9] Chronic hypoxic respiratory failure (HCC) [J96.11] Patient Active Problem List   Diagnosis Date Noted   Abdominal pain 07/16/2023   GI bleed 07/15/2023   Enterocolitis 06/30/2023   Sepsis due to undetermined organism (HCC) 06/30/2023   GERD without esophagitis 06/30/2023   Peripheral neuropathy 06/30/2023   RSV (respiratory syncytial virus pneumonia) 06/26/2023   Ileus (HCC) 06/23/2023   Lower GI bleed 12/16/2022   BRBPR (bright red blood per rectum) 12/15/2022   [name] 09/08/2022   Abnormal CT scan, sigmoid colon 04/14/2022  Colon stricture (HCC) 04/14/2022   Diverticulosis of colon without hemorrhage 04/14/2022   History of pulmonary embolus (PE) 11/15/2021   Tachycardia 10/22/2021   Pulmonary HTN (HCC)    Unspecified protein-calorie malnutrition (HCC) 04/05/2021   Pressure injury of skin 03/31/2021   Thrombocytopenia (HCC) 03/30/2021   Diabetic neuropathy (HCC) 05/24/2019   Hypokalemia 04/21/2019    Anticoagulated 06/25/2018   Chronic pain 06/25/2018   CRI (chronic renal insufficiency), stage 3 (moderate) (HCC) 06/25/2018   Drug-induced constipation 06/18/2018   DVT (deep venous thrombosis) (HCC) 06/03/2018   Primary osteoarthritis of both knees 05/26/2018   Body mass index (BMI) of 50-59.9 in adult (HCC) 05/26/2018   Acquired hypothyroidism 05/26/2018   Chronic bilateral low back pain without sciatica 02/17/2018   Astigmatism with presbyopia, bilateral 03/26/2017   Cortical age-related cataract of both eyes 03/26/2017   Family history of glaucoma 03/26/2017   Long term current use of oral hypoglycemic drug 03/26/2017   Nuclear sclerotic cataract of both eyes 03/26/2017   Pain in left hip 02/18/2017   Gout 11/29/2016   Depression 11/29/2016   Physical deconditioning 07/31/2015   Pulmonary fibrosis, postinflammatory (HCC) 07/31/2015   Bronchiectasis without complication (HCC) 07/31/2015   Tendinitis of left rotator cuff 06/13/2015   Chronic heart failure with preserved ejection fraction (HFpEF) (HCC) 04/21/2015   Chronic respiratory failure with hypoxia (HCC) 02/09/2015   Dyspnea and respiratory abnormality 01/03/2015   Normal coronary arteries 11/02/2014   Morbid obesity (HCC) 10/30/2014   Dysphagia 08/21/2014   Chronic fatigue 06/21/2014   DOE (dyspnea on exertion) 05/24/2014   Primary osteoarthritis of left knee 04/26/2014   High risk medication use 04/17/2014   Aphasia 02/12/2014   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) 01/18/2014   Lumbar facet arthropathy 01/18/2014   Bilateral edema of lower extremity 05/30/2013   Chronic cough 05/24/2013   ILD (interstitial lung disease) (HCC) 04/10/2013   Spinal stenosis of lumbar region with radiculopathy 07/12/2012   Inability to walk 07/12/2012   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 [place] [place] [address] Phone: [phone] Fax: [phone]  [id number] - [place], [place] - [address] AT [facility] [address] [facility] [address] [place] [address] Phone: [phone] Fax: [phone]     Social Drivers of Health (SDOH) Social History: SDOH Screenings   Food Insecurity: No Food Insecurity (07/16/2023)  Housing: Low Risk  (07/16/2023)  Transportation Needs: No Transportation Needs (07/16/2023)  Utilities: Not At Risk (07/16/2023)  Alcohol Screen: Low Risk  (10/27/2022)  Depression (PHQ2-9): High Risk (06/12/2023)  Financial Resource Strain: Low Risk  (06/11/2023)  Physical Activity: Inactive (06/11/2023)  Social Connections: Socially Integrated (07/16/2023)  Stress: No Stress Concern Present (10/27/2022)  Tobacco Use: Low Risk  (07/17/2023)   SDOH Interventions:     Readmission Risk Interventions    07/17/2023    3:28 PM 07/08/2023   10:09 AM 12/16/2022   11:09 AM  Readmission Risk Prevention Plan  Transportation Screening Complete Complete Complete  PCP or Specialist Appt within 5-7 Days   Complete  Home Care Screening   Complete  Medication Review (RN CM)   Complete  Medication Review (RN  Care Manager) Complete Referral to Pharmacy   PCP or Specialist appointment within 3-5 days of discharge Complete Complete   HRI or Home Care Consult Complete Complete   SW Recovery Care/Counseling Consult Complete    Palliative Care Screening Not Applicable Not Applicable   Skilled Nursing Facility Complete Complete

## 2023-07-17 NOTE — Transfer of Care (Signed)
 Immediate Anesthesia Transfer of Care Note  Patient: [name]  Procedure(s) Performed: LAPAROSCOPIC DIVERTING LOOP COLOSTOMY  Patient Location: PACU  Anesthesia Type:General  Level of Consciousness: awake, alert , oriented, patient cooperative, and responds to stimulation  Airway & Oxygen Therapy: Patient Spontanous Breathing and Patient connected to nasal cannula oxygen  Post-op Assessment: Report given to RN and Post -op Vital signs reviewed and stable  Post vital signs: Reviewed and stable  Last Vitals:  Vitals Value Taken Time  BP 143/94 07/17/23 1522  Temp 36.6 C 07/17/23 1522  Pulse 94 07/17/23 1525  Resp 14 07/17/23 1525  SpO2 99 % 07/17/23 1525  Vitals shown include unfiled device data.  Last Pain:  Vitals:   07/17/23 1311  TempSrc:   PainSc: 0-No pain         Complications: No notable events documented.

## 2023-07-17 NOTE — Progress Notes (Addendum)
 PROGRESS NOTE        PATIENT DETAILS Name: [name] Age: 83 y.o. Sex: female Date of Birth: 27-Mar-1941 Admit Date: 07/15/2023 Admitting Physician [name], MD NGE:[id number], [name], MD  Brief Summary: Patient is a 83 y.o.  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  Significant events: 2/26>> admit to [facility]  Significant studies: 1/27>> echo: EF 60-65%, grade 1 diastolic dysfunction. 2/26>> CT head: No acute intracranial abnormality 2/26>> CT abdomen/pelvis: Dilatation of colon-abrupt change in caliber noted involving the sigmoid colon-likely stricture.  Significant microbiology data: 2/26>> COVID/influenza/RSV PCR: Negative 2/26>> blood culture: No growth  Procedures: None  Consults: GI CCS  Subjective: Lying comfortably in bed-denies any chest pain or shortness of breath.  Continues to have some LLQ pain.  She is still in discussion of-and wants to ask surgery team down some questions before consenting to diverting ostomy.  Objective: Vitals: Blood pressure 127/71, pulse (!) 109, temperature 98 F (36.7 C), temperature source Oral, resp. rate 16, SpO2 98%.   Exam: Gen Exam:Alert awake-not in any distress HEENT:atraumatic, normocephalic Chest: B/L clear to auscultation anteriorly CVS:S1S2 regular Abdomen:soft non tender, non distended Extremities:no edema Neurology: Non focal Skin: no rash  Pertinent Labs/Radiology:    Latest Ref Rng & Units 07/17/2023    5:35 AM 07/16/2023    3:37 AM 07/15/2023   11:55 PM  CBC  WBC 4.0 - 10.5 K/uL 7.4  12.7  13.6   Hemoglobin 12.0 - 15.0 g/dL 9.3  13.2  44.0   Hematocrit 36.0 - 46.0 % 30.1  36.7  37.2   Platelets 150 - 400 K/uL 168  200  196     Lab Results  Component Value Date   NA 140  07/16/2023   K 3.8 07/16/2023   CL 108 07/16/2023   CO2 21 (L) 07/16/2023      Assessment/Plan: Left lower quadrant abdominal pain-diarrhea due to symptomatic sigmoid colon stricture Not felt to have infectious colitis-diarrhea/abdominal pain is secondary to symptomatic sigmoid stricture in the setting of diverticular disease. Appreciate GI input-General Surgery following-with potentially plans for diverting loop ostomy later today. If diarrhea recurs-will send out stool studies-suspect we can stop antibiotics in the next day or so.  Hypotension Secondary to hypovolemia and adrenal insufficiency/crisis (on chronic steroids) BP now stable with stress dose steroids/midodrine Once loop ostomy done-will need to slowly taper down to stress dose hydrocortisone.  FOBT positive stools No active bleeding noted GI not recommending inpatient colonoscopy Hb stable-follow closely Outpatient GI follow plan posthospital discharge.  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  HTN All antihypertensives on hold-BP stable  HLD Statin  DM-2 (A1c 5.6 on 1/26) Hypoglycemic episode this morning-patient n.p.o. Gentle IV fluid hydration with D5 NS-until she is ready for OR.  History of VTE Currently not on any anticoagulation-will need to be started on pharmacological prophylaxis postoperatively.  Hypokalemia Replete/recheck  Hypothyroidism Cellulitis  Peripheral neuropathy Neurontin  Chronic pain syndrome On narcotics-MS Contin 15 mg every 12 hours-and as needed oxycodone.  Debility/deconditioning PT/OT  Class 2 Obesity/Underweight: Estimated body mass index is 35.43 kg/m as  calculated from the following:   Height as of 06/30/23: 5\' 3"  (1.6 m).   Weight as of 06/30/23: 90.7 kg.   Code status:   Code Status: Full Code   DVT  Prophylaxis: SCDs Start: 07/15/23 1914   Family Communication: None at bedside   Disposition Plan: Status is: Inpatient Remains inpatient appropriate because: Severity of illness   Planned Discharge Destination:Home health   Diet: Diet Order             Diet NPO time specified Except for: Sips with Meds  Diet effective midnight                     Antimicrobial agents: Anti-infectives (From admission, onward)    Start     Dose/Rate Route Frequency Ordered Stop   07/16/23 0400  metroNIDAZOLE (FLAGYL) IVPB 500 mg        500 mg 100 mL/hr over 60 Minutes Intravenous Every 12 hours 07/15/23 1918     07/15/23 2200  cefTRIAXone (ROCEPHIN) 2 g in sodium chloride 0.9 % 100 mL IVPB        2 g 200 mL/hr over 30 Minutes Intravenous Every 24 hours 07/15/23 1918     07/15/23 1545  vancomycin (VANCOCIN) IVPB 1000 mg/200 mL premix       Placed in "And" Linked Group   1,000 mg 200 mL/hr over 60 Minutes Intravenous  Once 07/15/23 1533 07/15/23 1805   07/15/23 1545  vancomycin (VANCOCIN) IVPB 1000 mg/200 mL premix       Placed in "And" Linked Group   1,000 mg 200 mL/hr over 60 Minutes Intravenous  Once 07/15/23 1533 07/15/23 1708   07/15/23 1530  ceFEPIme (MAXIPIME) 2 g in sodium chloride 0.9 % 100 mL IVPB        2 g 200 mL/hr over 30 Minutes Intravenous  Once 07/15/23 1524 07/15/23 1643   07/15/23 1530  metroNIDAZOLE (FLAGYL) IVPB 500 mg        500 mg 100 mL/hr over 60 Minutes Intravenous  Once 07/15/23 1524 07/15/23 1708   07/15/23 1530  vancomycin (VANCOCIN) IVPB 1000 mg/200 mL premix  Status:  Discontinued        1,000 mg 200 mL/hr over 60 Minutes Intravenous  Once 07/15/23 1524 07/15/23 1531        MEDICATIONS: Scheduled Meds:  atorvastatin  10 mg Oral Daily   diclofenac Sodium  2 g Topical TID   DULoxetine  60 mg Oral Daily   gabapentin  300 mg Oral BID   hydrocortisone sod succinate (SOLU-CORTEF) inj  100 mg Intravenous Q12H   insulin aspart  0-6 Units  Subcutaneous TID WC   levothyroxine  50 mcg Oral Daily   lidocaine  1 patch Transdermal Q24H   midodrine  5 mg Oral TID WC   morphine  15 mg Oral Q12H   pantoprazole (PROTONIX) IV  40 mg Intravenous Q12H   sodium chloride flush  3 mL Intravenous Q12H   Continuous Infusions:  cefTRIAXone (ROCEPHIN)  IV Stopped (07/16/23 2315)   magnesium sulfate bolus IVPB     metronidazole Stopped (07/17/23 0500)   PRN Meds:.acetaminophen **OR** acetaminophen, albuterol, ondansetron (ZOFRAN) IV, oxyCODONE   I have personally reviewed following labs and imaging studies  LABORATORY DATA: CBC: Recent Labs  Lab 07/15/23 1209 07/15/23 1215 07/15/23 2355 07/16/23 0337 07/17/23 0535  WBC 11.9*  --  13.6* 12.7* 7.4  NEUTROABS 3.9  --   --   --  2.0  HGB  11.8* 12.9 11.1* 11.1* 9.3*  HCT 38.7 38.0 37.2 36.7 30.1*  MCV 99.7  --  100.3* 99.5 97.7  PLT 207  --  196 200 168    Basic Metabolic Panel: Recent Labs  Lab 07/15/23 1209 07/15/23 1215 07/15/23 2355 07/16/23 0337 07/16/23 1659  NA 140 140  --  140  --   K 3.4* 3.3*  --  3.8  --   CL 105 106  --  108  --   CO2 21*  --   --  21*  --   GLUCOSE 100* 104*  --  96  --   BUN 17 18  --  15  --   CREATININE 1.09* 1.10*  --  0.88  --   CALCIUM 8.3*  --   --  8.0*  --   MG  --   --  1.5* 1.4*  --   PHOS  --   --   --   --  2.1*    GFR: CrCl cannot be calculated (Unknown ideal weight.).  Liver Function Tests: Recent Labs  Lab 07/15/23 1209 07/16/23 0337  AST 15 15  ALT 11 11  ALKPHOS 45 37*  BILITOT 0.6 0.7  PROT 5.2* 4.5*  ALBUMIN 2.5* 2.1*   Recent Labs  Lab 07/15/23 1209  LIPASE 20   No results for input(s): "AMMONIA" in the last 168 hours.  Coagulation Profile: Recent Labs  Lab 07/15/23 1700  INR 1.2    Cardiac Enzymes: No results for input(s): "CKTOTAL", "CKMB", "CKMBINDEX", "TROPONINI" in the last 168 hours.  BNP (last 3 results) No results for input(s): "PROBNP" in the last 8760 hours.  Lipid Profile: No  results for input(s): "CHOL", "HDL", "LDLCALC", "TRIG", "CHOLHDL", "LDLDIRECT" in the last 72 hours.  Thyroid Function Tests: No results for input(s): "TSH", "T4TOTAL", "FREET4", "T3FREE", "THYROIDAB" in the last 72 hours.  Anemia Panel: No results for input(s): "VITAMINB12", "FOLATE", "FERRITIN", "TIBC", "IRON", "RETICCTPCT" in the last 72 hours.  Urine analysis:    Component Value Date/Time   COLORURINE YELLOW 07/15/2023 1616   APPEARANCEUR HAZY (A) 07/15/2023 1616   LABSPEC >1.046 (H) 07/15/2023 1616   PHURINE 5.0 07/15/2023 1616   GLUCOSEU NEGATIVE 07/15/2023 1616   HGBUR NEGATIVE 07/15/2023 1616   BILIRUBINUR NEGATIVE 07/15/2023 1616   BILIRUBINUR 1+ 01/30/2021 1450   KETONESUR 5 (A) 07/15/2023 1616   PROTEINUR NEGATIVE 07/15/2023 1616   UROBILINOGEN negative (A) 01/30/2021 1450   UROBILINOGEN 0.2 11/28/2019 1424   NITRITE NEGATIVE 07/15/2023 1616   LEUKOCYTESUR NEGATIVE 07/15/2023 1616    Sepsis Labs: Lactic Acid, Venous    Component Value Date/Time   LATICACIDVEN 1.0 07/16/2023 0557    MICROBIOLOGY: Recent Results (from the past 240 hours)  Culture, blood (routine x 2)     Status: None (Preliminary result)   Collection Time: 07/15/23  3:00 PM   Specimen: BLOOD  Result Value Ref Range Status   Specimen Description BLOOD LEFT ANTECUBITAL  Final   Special Requests   Final    BOTTLES DRAWN AEROBIC AND ANAEROBIC Blood Culture results may not be optimal due to an inadequate volume of blood received in culture bottles   Culture   Final    NO GROWTH 2 DAYS Performed at [facility], [address] [place], [place] [address]    Report Status PENDING  Incomplete  Culture, blood (routine x 2)     Status: None (Preliminary result)   Collection Time: 07/15/23  3:00 PM  Specimen: BLOOD  Result Value Ref Range Status   Specimen Description BLOOD RIGHT ANTECUBITAL  Final   Special Requests   Final    BOTTLES DRAWN AEROBIC AND ANAEROBIC Blood Culture results may  not be optimal due to an inadequate volume of blood received in culture bottles   Culture   Final    NO GROWTH 2 DAYS Performed at [facility], [address] [place], [place] [address]    Report Status PENDING  Incomplete  Resp panel by RT-PCR (RSV, Flu A&B, Covid) Anterior Nasal Swab     Status: None   Collection Time: 07/15/23  4:16 PM   Specimen: Anterior Nasal Swab  Result Value Ref Range Status   SARS Coronavirus 2 by RT PCR NEGATIVE NEGATIVE Final   Influenza A by PCR NEGATIVE NEGATIVE Final   Influenza B by PCR NEGATIVE NEGATIVE Final    Comment: (NOTE) The Xpert Xpress SARS-CoV-2/FLU/RSV plus assay is intended as an aid in the diagnosis of influenza from Nasopharyngeal swab specimens and should not be used as a sole basis for treatment. Nasal washings and aspirates are unacceptable for Xpert Xpress SARS-CoV-2/FLU/RSV testing.  Fact Sheet for Patients: BloggerCourse.com  Fact Sheet for Healthcare Providers: SeriousBroker.it  This test is not yet approved or cleared by the [place] FDA and has been authorized for detection and/or diagnosis of SARS-CoV-2 by FDA under an Emergency Use Authorization (EUA). This EUA will remain in effect (meaning this test can be used) for the duration of the COVID-19 declaration under Section 564(b)(1) of the Act, 21 U.S.C. section 360bbb-3(b)(1), unless the authorization is terminated or revoked.     Resp Syncytial Virus by PCR NEGATIVE NEGATIVE Final    Comment: (NOTE) Fact Sheet for Patients: BloggerCourse.com  Fact Sheet for Healthcare Providers: SeriousBroker.it  This test is not yet approved or cleared by the [place] FDA and has been authorized for detection and/or diagnosis of SARS-CoV-2 by FDA under an Emergency Use Authorization (EUA). This EUA will remain in effect (meaning this test can be used) for the  duration of the COVID-19 declaration under Section 564(b)(1) of the Act, 21 U.S.C. section 360bbb-3(b)(1), unless the authorization is terminated or revoked.  Performed at [facility], [address], [place], [place] [address]     RADIOLOGY STUDIES/RESULTS: DG Chest Port 1 View Result Date: 07/15/2023 CLINICAL DATA:  Weakness. EXAM: PORTABLE CHEST 1 VIEW COMPARISON:  Chest radiograph dated 07/01/2023. FINDINGS: The heart size and mediastinal contours are within normal limits. Streaky bibasilar opacities, slightly increased at the right lung base, with background chronic interstitial changes. No sizable pleural effusion or pneumothorax. No acute osseous abnormality. IMPRESSION: Streaky bibasilar opacities, slightly increased on the right, could reflect atelectasis or infiltrate superimposed on chronic interstitial changes. Electronically Signed   By: Hart Robinsons M.D.   On: 07/15/2023 16:36   CT ABDOMEN PELVIS W CONTRAST Result Date: 07/15/2023 CLINICAL DATA:  Left lower quadrant abdominal pain. EXAM: CT ABDOMEN AND PELVIS WITH CONTRAST TECHNIQUE: Multidetector CT imaging of the abdomen and pelvis was performed using the standard protocol following bolus administration of intravenous contrast. RADIATION DOSE REDUCTION: This exam was performed according to the departmental dose-optimization program which includes automated exposure control, adjustment of the mA and/or kV according to patient size and/or use of iterative reconstruction technique. CONTRAST:  75mL OMNIPAQUE IOHEXOL 350 MG/ML SOLN COMPARISON:  July 05, 2023. FINDINGS: Lower chest: Stable probable bibasilar scarring is noted. Hepatobiliary: No focal liver abnormality is seen. No  gallstones, gallbladder wall thickening, or biliary dilatation. Pancreas: Unremarkable. No pancreatic ductal dilatation or surrounding inflammatory changes. Spleen: Normal in size without focal abnormality. Adrenals/Urinary Tract: Adrenal glands  appear normal. Stable bilateral renal cysts are noted for which no further follow-up is required. Minimal nonobstructive right nephrolithiasis is noted. No hydronephrosis or renal obstruction is noted. Urinary bladder is decompressed. Stomach/Bowel: The stomach is unremarkable. No small bowel dilatation is noted. Stable colonic dilatation is noted. Mild to moderate wall thickening of proximal transverse colon is noted which may be due to lack of distension, but inflammatory or infectious colitis cannot be excluded. The appendix is not visualized. There is again noted abrupt caliber change involving the sigmoid colon which was present on prior exam, stricture cannot be excluded. Vascular/Lymphatic: Aortic atherosclerosis. No enlarged abdominal or pelvic lymph nodes. Reproductive: Status post hysterectomy. No adnexal masses. Other: No ascites or hernia is noted. Musculoskeletal: No acute osseous abnormality is noted. IMPRESSION: Stable probable bibasilar scarring is noted in the visualized lung bases. Minimal nonobstructive right nephrolithiasis. Continued dilatation of the colon is noted in most portions, most consistent with ileus. Mild to moderate wall thickening of proximal transverse colon is noted which may be due to lack of distension, but inflammatory or infectious colitis cannot be excluded. There is again noted abrupt caliber change involving the sigmoid colon which was present on prior exam, and stricture cannot be excluded. Aortic Atherosclerosis (ICD10-I70.0). Electronically Signed   By: Lupita Raider M.D.   On: 07/15/2023 15:36   CT Head Wo Contrast Result Date: 07/15/2023 CLINICAL DATA:  Provided history: Weakness. EXAM: CT HEAD WITHOUT CONTRAST TECHNIQUE: Contiguous axial images were obtained from the base of the skull through the vertex without intravenous contrast. RADIATION DOSE REDUCTION: This exam was performed according to the departmental dose-optimization program which includes automated  exposure control, adjustment of the mA and/or kV according to patient size and/or use of iterative reconstruction technique. COMPARISON:  Head CT 06/13/2023. FINDINGS: Brain: Cerebral atrophy. Patchy and ill-defined hypoattenuation within the cerebral white matter, nonspecific but compatible with moderate chronic small vessel ischemic disease. There is no acute intracranial hemorrhage. No demarcated cortical infarct. No extra-axial fluid collection. No evidence of an intracranial mass. No midline shift. Vascular: No hyperdense vessel. Atherosclerotic calcifications. Skull: No calvarial fracture or aggressive osseous lesion. Sinuses/Orbits: No mass or acute finding within the imaged orbits. Minimal mucosal thickening within the bilateral ethmoid and right maxillary sinuses. Other: Small-volume fluid within left mastoid air cells. IMPRESSION: 1. No evidence of an acute intracranial abnormality. 2. Moderate chronic small vessel ischemic changes within the cerebral white matter. 3. Cerebral atrophy. 4. Small left mastoid effusion. Electronically Signed   By: Jackey Loge D.O.   On: 07/15/2023 14:46     LOS: 1 day   Jeoffrey Massed, MD  [facility]    To contact the attending provider between 7A-7P or the covering provider during after hours 7P-7A, please log into the web site [email or web address] and access using universal [place] password for that web site. If you do not have the password, please call the hospital operator.  07/17/2023, 10:12 AM

## 2023-07-17 NOTE — Anesthesia Preprocedure Evaluation (Addendum)
 Anesthesia Evaluation  Patient identified by MRN, date of birth, ID band Patient awake    Reviewed: Allergy & Precautions, NPO status , Patient's Chart, lab work & pertinent test results, reviewed documented beta blocker date and time , Unable to perform ROS - Chart review only  History of Anesthesia Complications Negative for: history of anesthetic complications  Airway Mallampati: II  TM Distance: >3 FB Neck ROM: Full    Dental  (+) Edentulous Upper, Missing,    Pulmonary shortness of breath and with exertion, asthma , pneumonia, COPD (3L QHS, PRN during day),  oxygen dependent  ILD    Pulmonary exam normal        Cardiovascular hypertension, Pt. on medications and Pt. on home beta blockers + CAD, +CHF and + DOE  Normal cardiovascular exam+ Valvular Problems/Murmurs MR   Echo 05/2023 1. Left ventricular ejection fraction, by estimation, is 60 to 65%. The left ventricle has normal function. The left ventricle has no regional wall motion abnormalities. Left ventricular diastolic parameters are consistent with Grade I diastolic dysfunction (impaired relaxation).   2. Right ventricular systolic function is normal. The right ventricular size is normal. Tricuspid regurgitation signal is inadequate for assessing PA pressure.   3. The mitral valve is normal in structure. No evidence of mitral valve regurgitation. No evidence of mitral stenosis.   4. The aortic valve is tricuspid. There is mild calcification of the aortic valve. Aortic valve regurgitation is not visualized. No aortic stenosis is present.   5. IVC not visualized.   6. Exam truncated due to patient noncompliance.     '23 TTE - EF 55 to 60%. There is mild left ventricular hypertrophy. Grade I diastolic dysfunction (impaired relaxation). Right ventricular systolic function is mildly reduced. Trivial MR    Neuro/Psych  PSYCHIATRIC DISORDERS Anxiety Depression      Neuromuscular disease    GI/Hepatic ,GERD  Medicated,,  Endo/Other  diabetes, Type 2Hypothyroidism   Obesity   Renal/GU CRFRenal disease     Musculoskeletal  (+) Arthritis , Rheumatoid disorders,  narcotic dependent Gout    Abdominal   Peds  Hematology  (+) Blood dyscrasia, anemia   Anesthesia Other Findings On GLP-1a   Reproductive/Obstetrics                             Anesthesia Physical Anesthesia Plan  ASA: 4 and emergent  Anesthesia Plan:    Post-op Pain Management: Ofirmev IV (intra-op)*   Induction: Intravenous, Rapid sequence and Cricoid pressure planned  PONV Risk Score and Plan: 4 or greater and Treatment may vary due to age or medical condition, Ondansetron and Dexamethasone  Airway Management Planned: Oral ETT  Additional Equipment:   Intra-op Plan:   Post-operative Plan: Possible Post-op intubation/ventilation  Informed Consent: I have reviewed the patients History and Physical, chart, labs and discussed the procedure including the risks, benefits and alternatives for the proposed anesthesia with the patient or authorized representative who has indicated his/her understanding and acceptance.     Dental advisory given  Plan Discussed with: CRNA  Anesthesia Plan Comments: (2 x PIV)       Anesthesia Quick Evaluation

## 2023-07-17 NOTE — Anesthesia Procedure Notes (Signed)
 Procedure Name: Intubation Date/Time: 07/17/2023 1:49 PM  Performed by: Flonnie Hailstone, CRNAPre-anesthesia Checklist: Patient identified, Emergency Drugs available, Suction available and Patient being monitored Patient Re-evaluated:Patient Re-evaluated prior to induction Oxygen Delivery Method: Circle system utilized Preoxygenation: Pre-oxygenation with 100% oxygen Induction Type: IV induction, Rapid sequence and Cricoid Pressure applied Ventilation: Mask ventilation without difficulty and Oral airway inserted - appropriate to patient size Laryngoscope Size: Glidescope and 3 Grade View: Grade I Tube type: Oral Tube size: 7.0 mm Number of attempts: 1 Airway Equipment and Method: Stylet, Rigid stylet and Video-laryngoscopy Placement Confirmation: ETT inserted through vocal cords under direct vision, positive ETCO2 and breath sounds checked- equal and bilateral Secured at: 23 cm Tube secured with: Tape Dental Injury: Teeth and Oropharynx as per pre-operative assessment  Comments: intubated by C. Katrianna Friesenhahn, CRNA; ebbs ; first attempt with mac 3, grade 3 view with cricoid pressure intubated esophagus; removed and gently masked; successful intubation with glidescope

## 2023-07-17 NOTE — Progress Notes (Signed)
 Progress Note     Subjective: Feeling okay this am. No bms overnight or this am and not passing flatus but not nauseas. Feels bloated. She would like to proceed with surgery. No worsening SHOB  Son and daughter at bedside  Objective: Vital signs in last 24 hours: Temp:  [98 F (36.7 C)-99 F (37.2 C)] 98 F (36.7 C) (02/28 0800) Pulse Rate:  [105-131] 109 (02/28 0800) Resp:  [11-16] 16 (02/28 0800) BP: (112-160)/(71-98) 127/71 (02/28 0800) SpO2:  [98 %-100 %] 98 % (02/28 0800) Last BM Date : 07/16/23  Intake/Output from previous day: 02/27 0701 - 02/28 0700 In: 1766 [I.V.:1566; IV Piggyback:200] Out: -  Intake/Output this shift: No intake/output data recorded.  PE: General: pleasant, WD, female who is laying in bed in NAD Lungs: Respiratory effort nonlabored on 3 lpm O2 Abd: soft, distended. Mild diffuse TTP without rebound or guarding MSK: all 4 extremities are symmetrical with no cyanosis, clubbing, or edema. Skin: warm and dry Psych: A&Ox3 with an appropriate affect.    Lab Results:  Recent Labs    07/16/23 0337 07/17/23 0535  WBC 12.7* 7.4  HGB 11.1* 9.3*  HCT 36.7 30.1*  PLT 200 168   BMET Recent Labs    07/15/23 1209 07/15/23 1215 07/16/23 0337  NA 140 140 140  K 3.4* 3.3* 3.8  CL 105 106 108  CO2 21*  --  21*  GLUCOSE 100* 104* 96  BUN 17 18 15   CREATININE 1.09* 1.10* 0.88  CALCIUM 8.3*  --  8.0*   PT/INR Recent Labs    07/15/23 1700  LABPROT 15.8*  INR 1.2   CMP     Component Value Date/Time   NA 140 07/16/2023 0337   NA 145 (H) 09/29/2022 0921   NA 139 02/17/2012 1337   K 3.8 07/16/2023 0337   K 4.8 02/17/2012 1337   CL 108 07/16/2023 0337   CL 103 02/17/2012 1337   CO2 21 (L) 07/16/2023 0337   CO2 25 02/17/2012 1337   GLUCOSE 96 07/16/2023 0337   GLUCOSE 112 (H) 02/17/2012 1337   BUN 15 07/16/2023 0337   BUN 25 09/29/2022 0921   BUN 32.0 (H) 02/17/2012 1337   CREATININE 0.88 07/16/2023 0337   CREATININE 1.57 (H)  09/17/2016 1438   CREATININE 1.7 (H) 02/17/2012 1337   CALCIUM 8.0 (L) 07/16/2023 0337   CALCIUM 8.9 02/17/2012 1337   PROT 4.5 (L) 07/16/2023 0337   PROT 5.4 (L) 09/29/2022 0921   PROT 6.5 02/17/2012 1337   ALBUMIN 2.1 (L) 07/16/2023 0337   ALBUMIN 3.7 (L) 09/29/2022 0921   ALBUMIN 3.3 (L) 02/17/2012 1337   AST 15 07/16/2023 0337   AST 16 02/17/2012 1337   ALT 11 07/16/2023 0337   ALT 26 02/17/2012 1337   ALKPHOS 37 (L) 07/16/2023 0337   ALKPHOS 59 02/17/2012 1337   BILITOT 0.7 07/16/2023 0337   BILITOT 0.2 09/29/2022 0921   BILITOT 0.70 02/17/2012 1337   GFRNONAA >60 07/16/2023 0337   GFRNONAA 32 (L) 09/17/2016 1438   GFRAA 49 (L) 11/10/2019 2052   GFRAA 37 (L) 09/17/2016 1438   Lipase     Component Value Date/Time   LIPASE 20 07/15/2023 1209       Studies/Results: DG Chest Port 1 View Result Date: 07/15/2023 CLINICAL DATA:  Weakness. EXAM: PORTABLE CHEST 1 VIEW COMPARISON:  Chest radiograph dated 07/01/2023. FINDINGS: The heart size and mediastinal contours are within normal limits. Streaky bibasilar opacities, slightly increased at the  right lung base, with background chronic interstitial changes. No sizable pleural effusion or pneumothorax. No acute osseous abnormality. IMPRESSION: Streaky bibasilar opacities, slightly increased on the right, could reflect atelectasis or infiltrate superimposed on chronic interstitial changes. Electronically Signed   By: Hart Robinsons M.D.   On: 07/15/2023 16:36   CT ABDOMEN PELVIS W CONTRAST Result Date: 07/15/2023 CLINICAL DATA:  Left lower quadrant abdominal pain. EXAM: CT ABDOMEN AND PELVIS WITH CONTRAST TECHNIQUE: Multidetector CT imaging of the abdomen and pelvis was performed using the standard protocol following bolus administration of intravenous contrast. RADIATION DOSE REDUCTION: This exam was performed according to the departmental dose-optimization program which includes automated exposure control, adjustment of the mA  and/or kV according to patient size and/or use of iterative reconstruction technique. CONTRAST:  75mL OMNIPAQUE IOHEXOL 350 MG/ML SOLN COMPARISON:  July 05, 2023. FINDINGS: Lower chest: Stable probable bibasilar scarring is noted. Hepatobiliary: No focal liver abnormality is seen. No gallstones, gallbladder wall thickening, or biliary dilatation. Pancreas: Unremarkable. No pancreatic ductal dilatation or surrounding inflammatory changes. Spleen: Normal in size without focal abnormality. Adrenals/Urinary Tract: Adrenal glands appear normal. Stable bilateral renal cysts are noted for which no further follow-up is required. Minimal nonobstructive right nephrolithiasis is noted. No hydronephrosis or renal obstruction is noted. Urinary bladder is decompressed. Stomach/Bowel: The stomach is unremarkable. No small bowel dilatation is noted. Stable colonic dilatation is noted. Mild to moderate wall thickening of proximal transverse colon is noted which may be due to lack of distension, but inflammatory or infectious colitis cannot be excluded. The appendix is not visualized. There is again noted abrupt caliber change involving the sigmoid colon which was present on prior exam, stricture cannot be excluded. Vascular/Lymphatic: Aortic atherosclerosis. No enlarged abdominal or pelvic lymph nodes. Reproductive: Status post hysterectomy. No adnexal masses. Other: No ascites or hernia is noted. Musculoskeletal: No acute osseous abnormality is noted. IMPRESSION: Stable probable bibasilar scarring is noted in the visualized lung bases. Minimal nonobstructive right nephrolithiasis. Continued dilatation of the colon is noted in most portions, most consistent with ileus. Mild to moderate wall thickening of proximal transverse colon is noted which may be due to lack of distension, but inflammatory or infectious colitis cannot be excluded. There is again noted abrupt caliber change involving the sigmoid colon which was present on  prior exam, and stricture cannot be excluded. Aortic Atherosclerosis (ICD10-I70.0). Electronically Signed   By: Lupita Raider M.D.   On: 07/15/2023 15:36   CT Head Wo Contrast Result Date: 07/15/2023 CLINICAL DATA:  Provided history: Weakness. EXAM: CT HEAD WITHOUT CONTRAST TECHNIQUE: Contiguous axial images were obtained from the base of the skull through the vertex without intravenous contrast. RADIATION DOSE REDUCTION: This exam was performed according to the departmental dose-optimization program which includes automated exposure control, adjustment of the mA and/or kV according to patient size and/or use of iterative reconstruction technique. COMPARISON:  Head CT 06/13/2023. FINDINGS: Brain: Cerebral atrophy. Patchy and ill-defined hypoattenuation within the cerebral white matter, nonspecific but compatible with moderate chronic small vessel ischemic disease. There is no acute intracranial hemorrhage. No demarcated cortical infarct. No extra-axial fluid collection. No evidence of an intracranial mass. No midline shift. Vascular: No hyperdense vessel. Atherosclerotic calcifications. Skull: No calvarial fracture or aggressive osseous lesion. Sinuses/Orbits: No mass or acute finding within the imaged orbits. Minimal mucosal thickening within the bilateral ethmoid and right maxillary sinuses. Other: Small-volume fluid within left mastoid air cells. IMPRESSION: 1. No evidence of an acute intracranial abnormality. 2. Moderate chronic small vessel  ischemic changes within the cerebral white matter. 3. Cerebral atrophy. 4. Small left mastoid effusion. Electronically Signed   By: Jackey Loge D.O.   On: 07/15/2023 14:46    Anti-infectives: Anti-infectives (From admission, onward)    Start     Dose/Rate Route Frequency Ordered Stop   07/16/23 0400  metroNIDAZOLE (FLAGYL) IVPB 500 mg        500 mg 100 mL/hr over 60 Minutes Intravenous Every 12 hours 07/15/23 1918     07/15/23 2200  cefTRIAXone (ROCEPHIN) 2 g  in sodium chloride 0.9 % 100 mL IVPB        2 g 200 mL/hr over 30 Minutes Intravenous Every 24 hours 07/15/23 1918     07/15/23 1545  vancomycin (VANCOCIN) IVPB 1000 mg/200 mL premix       Placed in "And" Linked Group   1,000 mg 200 mL/hr over 60 Minutes Intravenous  Once 07/15/23 1533 07/15/23 1805   07/15/23 1545  vancomycin (VANCOCIN) IVPB 1000 mg/200 mL premix       Placed in "And" Linked Group   1,000 mg 200 mL/hr over 60 Minutes Intravenous  Once 07/15/23 1533 07/15/23 1708   07/15/23 1530  ceFEPIme (MAXIPIME) 2 g in sodium chloride 0.9 % 100 mL IVPB        2 g 200 mL/hr over 30 Minutes Intravenous  Once 07/15/23 1524 07/15/23 1643   07/15/23 1530  metroNIDAZOLE (FLAGYL) IVPB 500 mg        500 mg 100 mL/hr over 60 Minutes Intravenous  Once 07/15/23 1524 07/15/23 1708   07/15/23 1530  vancomycin (VANCOCIN) IVPB 1000 mg/200 mL premix  Status:  Discontinued        1,000 mg 200 mL/hr over 60 Minutes Intravenous  Once 07/15/23 1524 07/15/23 1531        Assessment/Plan  Colonic obstruction secondary to diverticular stricture    - afebrile and WBC nml. Hgb 9.3 (11.1) - hypokalemia - repletion IV today - plan for OR today for diverting loop colostomy due to worsening obstruction  FEN: NPO for OR ID: [email or web address] VTE: will start post op  - Per primary -  T2DM HTN HLD Gout GERD RA - on plaquenil baseline, on prednisone Chronic pain - on long acting morphine bid at baseline Neuropathy - on gabapentin at baseline CKD with AKI Bronchiectasis/ILD/chronic resp failure - on 3 lpm baseline  I reviewed last 24 h vitals and pain scores, last 48 h intake and output, last 24 h labs and trends, and last 24 h imaging results.    LOS: 1 day   Eric Form, [facility] 07/17/2023, 9:50 AM Please see Amion for pager number during day hours 7:00am-4:30pm

## 2023-07-18 ENCOUNTER — Encounter (HOSPITAL_COMMUNITY): Payer: Self-pay | Admitting: Surgery

## 2023-07-18 DIAGNOSIS — K529 Noninfective gastroenteritis and colitis, unspecified: Secondary | ICD-10-CM | POA: Diagnosis not present

## 2023-07-18 DIAGNOSIS — E039 Hypothyroidism, unspecified: Secondary | ICD-10-CM | POA: Diagnosis not present

## 2023-07-18 DIAGNOSIS — J9611 Chronic respiratory failure with hypoxia: Secondary | ICD-10-CM | POA: Diagnosis not present

## 2023-07-18 DIAGNOSIS — K219 Gastro-esophageal reflux disease without esophagitis: Secondary | ICD-10-CM | POA: Diagnosis not present

## 2023-07-18 LAB — CBC
HCT: 29.3 % — ABNORMAL LOW (ref 36.0–46.0)
Hemoglobin: 9.3 g/dL — ABNORMAL LOW (ref 12.0–15.0)
MCH: 30.6 pg (ref 26.0–34.0)
MCHC: 31.7 g/dL (ref 30.0–36.0)
MCV: 96.4 fL (ref 80.0–100.0)
Platelets: 180 10*3/uL (ref 150–400)
RBC: 3.04 MIL/uL — ABNORMAL LOW (ref 3.87–5.11)
RDW: 16.1 % — ABNORMAL HIGH (ref 11.5–15.5)
WBC: 7.3 10*3/uL (ref 4.0–10.5)
nRBC: 0 % (ref 0.0–0.2)

## 2023-07-18 LAB — BASIC METABOLIC PANEL
Anion gap: 13 (ref 5–15)
BUN: 11 mg/dL (ref 8–23)
CO2: 21 mmol/L — ABNORMAL LOW (ref 22–32)
Calcium: 7.9 mg/dL — ABNORMAL LOW (ref 8.9–10.3)
Chloride: 107 mmol/L (ref 98–111)
Creatinine, Ser: 0.62 mg/dL (ref 0.44–1.00)
GFR, Estimated: >60 mL/min (ref >60)
Glucose, Bld: 136 mg/dL — ABNORMAL HIGH (ref 70–99)
Potassium: 2.9 mmol/L — ABNORMAL LOW (ref 3.5–5.1)
Sodium: 141 mmol/L (ref 135–145)

## 2023-07-18 LAB — GLUCOSE, CAPILLARY
Glucose-Capillary: 149 mg/dL — ABNORMAL HIGH (ref 70–99)
Glucose-Capillary: 140 mg/dL — ABNORMAL HIGH (ref 70–99)
Glucose-Capillary: 127 mg/dL — ABNORMAL HIGH (ref 70–99)
Glucose-Capillary: 128 mg/dL — ABNORMAL HIGH (ref 70–99)

## 2023-07-18 LAB — MAGNESIUM: Magnesium: 1.7 mg/dL (ref 1.7–2.4)

## 2023-07-18 MED ORDER — METOPROLOL SUCCINATE ER 25 MG PO TB24
25.0000 mg | ORAL_TABLET | Freq: Every day | ORAL | Status: DC
  Administered 2023-07-18: 25 mg via ORAL
  Filled 2023-07-18: qty 1

## 2023-07-18 MED ORDER — SALINE SPRAY 0.65 % NA SOLN
1.0000 | NASAL | Status: DC | PRN
  Administered 2023-07-18: 1 via NASAL
  Filled 2023-07-18: qty 44

## 2023-07-18 MED ORDER — POTASSIUM CHLORIDE 20 MEQ PO PACK
20.0000 meq | PACK | Freq: Two times a day (BID) | ORAL | Status: DC
  Administered 2023-07-18: 20 meq via ORAL
  Filled 2023-07-18: qty 1

## 2023-07-18 MED ORDER — PANTOPRAZOLE SODIUM 40 MG PO TBEC
40.0000 mg | DELAYED_RELEASE_TABLET | Freq: Two times a day (BID) | ORAL | Status: DC
  Administered 2023-07-18 – 2023-07-20 (×4): 40 mg via ORAL
  Filled 2023-07-18 (×4): qty 1

## 2023-07-18 MED ORDER — GUAIFENESIN ER 600 MG PO TB12
1200.0000 mg | ORAL_TABLET | Freq: Two times a day (BID) | ORAL | Status: DC
  Administered 2023-07-18 – 2023-07-20 (×5): 1200 mg via ORAL
  Filled 2023-07-18 (×5): qty 2

## 2023-07-18 MED ORDER — HYDROCORTISONE SOD SUC (PF) 100 MG IJ SOLR
50.0000 mg | Freq: Two times a day (BID) | INTRAMUSCULAR | Status: DC
  Administered 2023-07-18 – 2023-07-19 (×2): 50 mg via INTRAVENOUS
  Filled 2023-07-18 (×2): qty 2

## 2023-07-18 MED ORDER — ENOXAPARIN SODIUM 40 MG/0.4ML IJ SOSY
40.0000 mg | PREFILLED_SYRINGE | INTRAMUSCULAR | Status: DC
  Administered 2023-07-18 – 2023-07-20 (×3): 40 mg via SUBCUTANEOUS
  Filled 2023-07-18 (×3): qty 0.4

## 2023-07-18 MED ORDER — POTASSIUM CHLORIDE 10 MEQ/100ML IV SOLN
10.0000 meq | INTRAVENOUS | Status: AC
  Administered 2023-07-18 (×3): 10 meq via INTRAVENOUS
  Filled 2023-07-18 (×2): qty 100

## 2023-07-18 MED ORDER — MIDODRINE HCL 5 MG PO TABS
5.0000 mg | ORAL_TABLET | Freq: Two times a day (BID) | ORAL | Status: DC
  Filled 2023-07-18: qty 1

## 2023-07-18 NOTE — Progress Notes (Signed)
 PROGRESS NOTE        PATIENT DETAILS Name: [name] Age: 83 y.o. Sex: female Date of Birth: Oct 26, 1940 Admit Date: 07/15/2023 Admitting Physician [name], MD [id number], [name], MD  Brief Summary: Patient is a 83 y.o.  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  Significant events: 2/26>> admit to [facility]  Significant studies: 1/27>> echo: EF 60-65%, grade 1 diastolic dysfunction. 2/26>> CT head: No acute intracranial abnormality 2/26>> CT abdomen/pelvis: Dilatation of colon-abrupt change in caliber noted involving the sigmoid colon-likely stricture.  Significant microbiology data: 2/26>> COVID/influenza/RSV PCR: Negative 2/26>> blood culture: No growth  Procedures: None  Consults: GI CCS  Subjective: Lying comfortably in bed-liquid/bile colored output in ostomy.  Significantly less LLQ pain.  Objective: Vitals: Blood pressure (!) 180/94, pulse 94, temperature 98.4 F (36.9 C), temperature source Oral, resp. rate 12, height 5\' 3"  (1.6 m), weight 90.7 kg, SpO2 93%.   Exam: Gen Exam:Alert awake-not in any distress HEENT:atraumatic, normocephalic Chest: B/L clear to auscultation anteriorly CVS:S1S2 regular Abdomen:soft non tender, non distended-liquid colored stool in ostomy Extremities:no edema Neurology: Non focal Skin: no rash  Pertinent Labs/Radiology:    Latest Ref Rng & Units 07/18/2023    5:34 AM 07/17/2023    5:35 AM 07/16/2023    3:37 AM  CBC  WBC 4.0 - 10.5 K/uL 7.3  7.4  12.7   Hemoglobin 12.0 - 15.0 g/dL 9.3  9.3  62.1   Hematocrit 36.0 - 46.0 % 29.3  30.1  36.7   Platelets 150 - 400 K/uL 180  168  200     Lab Results  Component Value Date   NA 141 07/18/2023   K 2.9 (L) 07/18/2023   CL 107 07/18/2023   CO2 21  (L) 07/18/2023      Assessment/Plan: Left lower quadrant abdominal pain-diarrhea due to symptomatic sigmoid colon stricture S/p diverting loop ostomy on 2/28 Diet being advanced-General Surgery following Mobilize with PT/OT At this point-not felt to have infectious diarrhea-stopping all antibiotics  Hypotension Secondary to hypovolemia and adrenal insufficiency/crisis (on chronic steroids) BP stable--in fact on the higher side Change midodrine to twice daily dosing-add holding parameters Taper down hydrocortisone dosage.  FOBT positive stools No active bleeding noted GI not recommending inpatient colonoscopy Hb stable-follow closely Outpatient GI follow plan posthospital discharge.  ILD secondary to RA Bronchiectasis Chronic hypoxic respiratory failure 3L at baseline Leflunomide/Plaquenil on hold Started on stress dose hydrocortisone due to hypotension-see above regarding plans to taper.  HTN BP on the higher side Resume low-dose metoprolol Titrating down midodrine and stress dose steroids  HLD Statin  DM-2 (A1c 5.6 on 1/26) SSI-CBG stable.  Recent Labs    07/17/23 1522 07/17/23 2137 07/18/23 0755  GLUCAP 136* 155* 149*     History of VTE Currently not on any anticoagulation-will need to be started on pharmacological prophylaxis postoperatively.  Hypokalemia Replete/recheck  Hypothyroidism Cellulitis  Peripheral neuropathy Neurontin  Chronic pain syndrome On narcotics-MS Contin 15 mg every 12 hours-and as needed oxycodone.  Debility/deconditioning PT/OT  Class 2 Obesity/Underweight: Estimated body mass index is 35.42 kg/m as calculated from the following:   Height as of this encounter: 5\' 3"  (1.6 m).   Weight as  of this encounter: 90.7 kg.   Code status:   Code Status: Full Code   DVT Prophylaxis: enoxaparin (LOVENOX) injection 40 mg Start: 07/18/23 1030 SCDs Start: 07/15/23 1914   Family Communication: None at bedside   Disposition  Plan: Status is: Inpatient Remains inpatient appropriate because: Severity of illness   Planned Discharge Destination:Home health   Diet: Diet Order             Diet full liquid Room service appropriate? Yes; Fluid consistency: Thin  Diet effective now                     Antimicrobial agents: Anti-infectives (From admission, onward)    Start     Dose/Rate Route Frequency Ordered Stop   07/16/23 0400  metroNIDAZOLE (FLAGYL) IVPB 500 mg        500 mg 100 mL/hr over 60 Minutes Intravenous Every 12 hours 07/15/23 1918     07/15/23 2200  cefTRIAXone (ROCEPHIN) 2 g in sodium chloride 0.9 % 100 mL IVPB        2 g 200 mL/hr over 30 Minutes Intravenous Every 24 hours 07/15/23 1918     07/15/23 1545  vancomycin (VANCOCIN) IVPB 1000 mg/200 mL premix       Placed in "And" Linked Group   1,000 mg 200 mL/hr over 60 Minutes Intravenous  Once 07/15/23 1533 07/15/23 1805   07/15/23 1545  vancomycin (VANCOCIN) IVPB 1000 mg/200 mL premix       Placed in "And" Linked Group   1,000 mg 200 mL/hr over 60 Minutes Intravenous  Once 07/15/23 1533 07/15/23 1708   07/15/23 1530  ceFEPIme (MAXIPIME) 2 g in sodium chloride 0.9 % 100 mL IVPB        2 g 200 mL/hr over 30 Minutes Intravenous  Once 07/15/23 1524 07/15/23 1643   07/15/23 1530  metroNIDAZOLE (FLAGYL) IVPB 500 mg        500 mg 100 mL/hr over 60 Minutes Intravenous  Once 07/15/23 1524 07/15/23 1708   07/15/23 1530  vancomycin (VANCOCIN) IVPB 1000 mg/200 mL premix  Status:  Discontinued        1,000 mg 200 mL/hr over 60 Minutes Intravenous  Once 07/15/23 1524 07/15/23 1531        MEDICATIONS: Scheduled Meds:  atorvastatin  10 mg Oral Daily   diclofenac Sodium  2 g Topical TID   DULoxetine  60 mg Oral Daily   enoxaparin (LOVENOX) injection  40 mg Subcutaneous Q24H   feeding supplement  1 Container Oral TID BM   gabapentin  300 mg Oral BID   guaiFENesin  1,200 mg Oral BID   hydrocortisone sod succinate (SOLU-CORTEF) inj  100 mg  Intravenous Q12H   insulin aspart  0-6 Units Subcutaneous TID WC   levothyroxine  50 mcg Oral Daily   lidocaine  1 patch Transdermal Q24H   midodrine  5 mg Oral TID WC   morphine  15 mg Oral Q12H   pantoprazole (PROTONIX) IV  40 mg Intravenous Q12H   potassium chloride  20 mEq Oral BID   sodium chloride flush  3 mL Intravenous Q12H   Continuous Infusions:  cefTRIAXone (ROCEPHIN)  IV 2 g (07/17/23 2049)   magnesium sulfate bolus IVPB     metronidazole 500 mg (07/18/23 0432)   PRN Meds:.acetaminophen **OR** acetaminophen, albuterol, menthol-cetylpyridinium, methocarbamol, morphine injection, ondansetron (ZOFRAN) IV, oxyCODONE, sodium chloride   I have personally reviewed following labs and imaging studies  LABORATORY DATA: CBC: Recent  Labs  Lab 07/15/23 1209 07/15/23 1215 07/15/23 2355 07/16/23 0337 07/17/23 0535 07/18/23 0534  WBC 11.9*  --  13.6* 12.7* 7.4 7.3  NEUTROABS 3.9  --   --   --  2.0  --   HGB 11.8* 12.9 11.1* 11.1* 9.3* 9.3*  HCT 38.7 38.0 37.2 36.7 30.1* 29.3*  MCV 99.7  --  100.3* 99.5 97.7 96.4  PLT 207  --  196 200 168 180    Basic Metabolic Panel: Recent Labs  Lab 07/15/23 1209 07/15/23 1215 07/15/23 2355 07/16/23 0337 07/16/23 1659 07/17/23 0750 07/17/23 1037 07/18/23 0534  NA 140 140  --  140  --  141 141 141  K 3.4* 3.3*  --  3.8  --  3.2* 3.2* 2.9*  CL 105 106  --  108  --  107 105 107  CO2 21*  --   --  21*  --  25 24 21*  GLUCOSE 100* 104*  --  96  --  77 73 136*  BUN 17 18  --  15  --  13 14 11   CREATININE 1.09* 1.10*  --  0.88  --  0.65 0.67 0.62  CALCIUM 8.3*  --   --  8.0*  --  7.9* 7.9* 7.9*  MG  --   --  1.5* 1.4*  --   --  1.8 1.7  PHOS  --   --   --   --  2.1* 2.1*  --   --     GFR: Estimated Creatinine Clearance: 58.9 mL/min (by C-G formula based on SCr of 0.62 mg/dL).  Liver Function Tests: Recent Labs  Lab 07/15/23 1209 07/16/23 0337 07/17/23 0750  AST 15 15  --   ALT 11 11  --   ALKPHOS 45 37*  --   BILITOT 0.6  0.7  --   PROT 5.2* 4.5*  --   ALBUMIN 2.5* 2.1* 2.1*   Recent Labs  Lab 07/15/23 1209  LIPASE 20   No results for input(s): "AMMONIA" in the last 168 hours.  Coagulation Profile: Recent Labs  Lab 07/15/23 1700  INR 1.2    Cardiac Enzymes: No results for input(s): "CKTOTAL", "CKMB", "CKMBINDEX", "TROPONINI" in the last 168 hours.  BNP (last 3 results) No results for input(s): "PROBNP" in the last 8760 hours.  Lipid Profile: No results for input(s): "CHOL", "HDL", "LDLCALC", "TRIG", "CHOLHDL", "LDLDIRECT" in the last 72 hours.  Thyroid Function Tests: No results for input(s): "TSH", "T4TOTAL", "FREET4", "T3FREE", "THYROIDAB" in the last 72 hours.  Anemia Panel: No results for input(s): "VITAMINB12", "FOLATE", "FERRITIN", "TIBC", "IRON", "RETICCTPCT" in the last 72 hours.  Urine analysis:    Component Value Date/Time   COLORURINE YELLOW 07/15/2023 1616   APPEARANCEUR HAZY (A) 07/15/2023 1616   LABSPEC >1.046 (H) 07/15/2023 1616   PHURINE 5.0 07/15/2023 1616   GLUCOSEU NEGATIVE 07/15/2023 1616   HGBUR NEGATIVE 07/15/2023 1616   BILIRUBINUR NEGATIVE 07/15/2023 1616   BILIRUBINUR 1+ 01/30/2021 1450   KETONESUR 5 (A) 07/15/2023 1616   PROTEINUR NEGATIVE 07/15/2023 1616   UROBILINOGEN negative (A) 01/30/2021 1450   UROBILINOGEN 0.2 11/28/2019 1424   NITRITE NEGATIVE 07/15/2023 1616   LEUKOCYTESUR NEGATIVE 07/15/2023 1616    Sepsis Labs: Lactic Acid, Venous    Component Value Date/Time   LATICACIDVEN 1.0 07/16/2023 0557    MICROBIOLOGY: Recent Results (from the past 240 hours)  Culture, blood (routine x 2)     Status: None (Preliminary result)   Collection Time:  07/15/23  3:00 PM   Specimen: BLOOD  Result Value Ref Range Status   Specimen Description BLOOD LEFT ANTECUBITAL  Final   Special Requests   Final    BOTTLES DRAWN AEROBIC AND ANAEROBIC Blood Culture results may not be optimal due to an inadequate volume of blood received in culture bottles    Culture   Final    NO GROWTH 3 DAYS Performed at [facility], [address] [place], [place] [address]    Report Status PENDING  Incomplete  Culture, blood (routine x 2)     Status: None (Preliminary result)   Collection Time: 07/15/23  3:00 PM   Specimen: BLOOD  Result Value Ref Range Status   Specimen Description BLOOD RIGHT ANTECUBITAL  Final   Special Requests   Final    BOTTLES DRAWN AEROBIC AND ANAEROBIC Blood Culture results may not be optimal due to an inadequate volume of blood received in culture bottles   Culture   Final    NO GROWTH 3 DAYS Performed at [facility], [address] [place], [place] [address]    Report Status PENDING  Incomplete  Resp panel by RT-PCR (RSV, Flu A&B, Covid) Anterior Nasal Swab     Status: None   Collection Time: 07/15/23  4:16 PM   Specimen: Anterior Nasal Swab  Result Value Ref Range Status   SARS Coronavirus 2 by RT PCR NEGATIVE NEGATIVE Final   Influenza A by PCR NEGATIVE NEGATIVE Final   Influenza B by PCR NEGATIVE NEGATIVE Final    Comment: (NOTE) The Xpert Xpress SARS-CoV-2/FLU/RSV plus assay is intended as an aid in the diagnosis of influenza from Nasopharyngeal swab specimens and should not be used as a sole basis for treatment. Nasal washings and aspirates are unacceptable for Xpert Xpress SARS-CoV-2/FLU/RSV testing.  Fact Sheet for Patients: BloggerCourse.com  Fact Sheet for Healthcare Providers: SeriousBroker.it  This test is not yet approved or cleared by the [place] FDA and has been authorized for detection and/or diagnosis of SARS-CoV-2 by FDA under an Emergency Use Authorization (EUA). This EUA will remain in effect (meaning this test can be used) for the duration of the COVID-19 declaration under Section 564(b)(1) of the Act, 21 U.S.C. section 360bbb-3(b)(1), unless the authorization is terminated or revoked.     Resp Syncytial Virus by PCR  NEGATIVE NEGATIVE Final    Comment: (NOTE) Fact Sheet for Patients: BloggerCourse.com  Fact Sheet for Healthcare Providers: SeriousBroker.it  This test is not yet approved or cleared by the [place] FDA and has been authorized for detection and/or diagnosis of SARS-CoV-2 by FDA under an Emergency Use Authorization (EUA). This EUA will remain in effect (meaning this test can be used) for the duration of the COVID-19 declaration under Section 564(b)(1) of the Act, 21 U.S.C. section 360bbb-3(b)(1), unless the authorization is terminated or revoked.  Performed at [facility], [address] [place], [place] [address]     RADIOLOGY STUDIES/RESULTS: No results found.    LOS: 2 days   Jeoffrey Massed, MD  [facility]    To contact the attending provider between 7A-7P or the covering provider during after hours 7P-7A, please log into the web site [email or web address] and access using universal [place] password for that web site. If you do not have the password, please call the hospital operator.  07/18/2023, 12:32 PM

## 2023-07-18 NOTE — Evaluation (Signed)
 Physical Therapy Evaluation Patient Details Name: [name] MRN: [id number] DOB: 1940/10/22 Today's Date: 07/18/2023  History of Present Illness  83 y.o.  female presented 2/26 with abdominal pain and diarrhea, symptomatic sigmoid colon stricture  S/p diverting loop ostomy on 2/28.   Hx of chronic hypoxic respiratory failure on home O2-3 L, RA on chronic steroids, VTE on anticoagulation, HTN, DM-2, chronic sigmoid stricture related to diverticular diseas.   Clinical Impression  Pt admitted with above diagnosis. Doing quite a bit better than last admission. Able to perform bed mobility and transfer with min assist. Pre-gait activities and lateral steps along bed today with min assist + RW for support. Does demonstrate significant LE weakness and HR increased to 150s briefly before sitting down to recover back to low 100s. SpO2 97% on 1L. I think with her added medical complexity and progress/potential, CIR would be a very helpful transition for pt before returning home. Pt motivated and family would very much like her to participate in more rehab than she was receiving PTA, despite the noted progress. Pt currently with functional limitations due to the deficits listed below (see PT Problem List). Pt will benefit from acute skilled PT to increase their independence and safety with mobility to allow discharge.     Unsure of the dynamics of transitioning to CIR after being admitted from SNF but would appreciate coordinators taking a look to see if this is possible. Pt would greatly benefit if so.      If plan is discharge home, recommend the following: A lot of help with walking and/or transfers;Assistance with cooking/housework;Assist for transportation;Help with stairs or ramp for entrance;A little help with bathing/dressing/bathroom   Can travel by private vehicle   No (Likely soon)    Equipment Recommendations None recommended by PT  Recommendations for Other Services  Rehab consult     Functional Status Assessment Patient has had a recent decline in their functional status and demonstrates the ability to make significant improvements in function in a reasonable and predictable amount of time.     Precautions / Restrictions Precautions Precautions: Fall Recall of Precautions/Restrictions: Intact Restrictions Weight Bearing Restrictions Per Provider Order: No      Mobility  Bed Mobility Overal bed mobility: Needs Assistance Bed Mobility: Rolling, Sidelying to Sit Rolling: Min assist Sidelying to sit: HOB elevated, Used rails, Min assist       General bed mobility comments: Min assist to roll and rise, educated on technique for ease and comfort. Slow to bring LEs to EOB, minor assist to bring the rest of the way off of the bed and trunk support to rise which again was slow and effortful.    Transfers Overall transfer level: Needs assistance Equipment used: Rolling walker (2 wheels) Transfers: Sit to/from Stand Sit to Stand: Min assist           General transfer comment: Min assist for boost to stand x2 from bed with cues for hand placement and technique. Rocks lightly for momentum. Stable with RW for support to help stabilize, back unable to fully extended (pt reports chronic from spinal stenosis.)    Ambulation/Gait Ambulation/Gait assistance: Min assist Gait Distance (Feet): 3 Feet Assistive device: Rolling walker (2 wheels) Gait Pattern/deviations: Step-to pattern Gait velocity: decreased   Pre-gait activities: Weight shift, static march, upright posture in RW with BIL UE support. at [facility] level General Gait Details: Cues for upright posture, able to march in place and progress with lateral steps along bed. HR did  jump to 150s briefly but recovers to low 100s with seated rest. Lt knee a little unstable but no overt buckling and when pt able to extend arms on RW as instructed, stabilizes quite well.  Stairs            Wheelchair Mobility      Tilt Bed    Modified Rankin (Stroke Patients Only)       Balance Overall balance assessment: Needs assistance Sitting-balance support: Feet supported Sitting balance-Leahy Scale: Good     Standing balance support: Bilateral upper extremity supported, During functional activity, Reliant on assistive device for balance Standing balance-Leahy Scale: Poor Standing balance comment: forward flexion due to reported spinal stenosis                             Pertinent Vitals/Pain Pain Assessment Pain Assessment: No/denies pain Pain Intervention(s): Monitored during session    Home Living Family/patient expects to be discharged to:: Inpatient rehab Living Arrangements: Spouse/significant other Available Help at Discharge: Family;Available 24 hours/day (aides m-f 9-1) Type of Home: House Home Access: Stairs to enter Entrance Stairs-Rails: None Entrance Stairs-Number of Steps: 1   Home Layout: Two level;Able to live on main level with bedroom/bathroom Home Equipment: Rollator (4 wheels);Wheelchair - Forensic psychologist (2 wheels);Shower seat Additional Comments: 24/7 assist available from husband and brother-in-law who lives with patient. aide M-F    Prior Function Prior Level of Function : Independent/Modified Independent             Mobility Comments: rollator in the house, wheelchair community distances ADLs Comments: MOD I for ADL, assist for IADLs prior to previous admission; now requires assist for all ADL/IADL     Extremity/Trunk Assessment   Upper Extremity Assessment Upper Extremity Assessment: Defer to OT evaluation    Lower Extremity Assessment Lower Extremity Assessment: Generalized weakness       Communication   Communication Communication: No apparent difficulties    Cognition Arousal: Alert Behavior During Therapy: WFL for tasks assessed/performed   PT - Cognitive impairments: No apparent impairments                          Following commands: Intact       Cueing Cueing Techniques: Verbal cues, Tactile cues     General Comments General comments (skin integrity, edema, etc.): Family present supportive. HR 177/105* SpO2 97% on 1L supplemental O2.    Exercises General Exercises - Lower Extremity Ankle Circles/Pumps: AROM, 10 reps, Both, Supine Quad Sets: 10 reps, Both, Supine, Strengthening Gluteal Sets: 10 reps, Strengthening, Both, Supine Straight Leg Raises: Strengthening, Both, 5 reps, Supine   Assessment/Plan    PT Assessment Patient needs continued PT services  PT Problem List Decreased strength;Decreased activity tolerance;Decreased balance;Decreased mobility;Decreased knowledge of use of DME;Decreased knowledge of precautions;Cardiopulmonary status limiting activity;Obesity       PT Treatment Interventions DME instruction;Gait training;Stair training;Functional mobility training;Therapeutic activities;Therapeutic exercise;Balance training;Neuromuscular re-education;Cognitive remediation;Patient/family education;Wheelchair mobility training;Modalities    PT Goals (Current goals can be found in the Care Plan section)  Acute Rehab PT Goals Patient Stated Goal: Get well, increase therapy, go home PT Goal Formulation: With patient/family Time For Goal Achievement: 08/01/23 Potential to Achieve Goals: Good    Frequency Min 1X/week     Co-evaluation               AM-PAC PT "6 Clicks" Mobility  Outcome Measure Help needed turning from  your back to your side while in a flat bed without using bedrails?: A Little Help needed moving from lying on your back to sitting on the side of a flat bed without using bedrails?: A Little Help needed moving to and from a bed to a chair (including a wheelchair)?: A Little Help needed standing up from a chair using your arms (e.g., wheelchair or bedside chair)?: A Little Help needed to walk in hospital room?: A Lot Help needed climbing 3-5 steps  with a railing? : Total 6 Click Score: 15    End of Session Equipment Utilized During Treatment: Oxygen Activity Tolerance: Patient tolerated treatment well Patient left: with call bell/phone within reach;in bed;with bed alarm set;with family/visitor present   PT Visit Diagnosis: Difficulty in walking, not elsewhere classified (R26.2);Muscle weakness (generalized) (M62.81);Unsteadiness on feet (R26.81);History of falling (Z91.81)    Time: 4098-1191 PT Time Calculation (min) (ACUTE ONLY): 41 min   Charges:   PT Evaluation $PT Eval Low Complexity: 1 Low PT Treatments $Therapeutic Exercise: 8-22 mins $Therapeutic Activity: 8-22 mins PT General Charges $$ ACUTE PT VISIT: 1 Visit         Kathlyn Sacramento, PT, DPT [facility]  Rehabilitation Services Physical Therapist Office: [phone] Website: [email or web address]   [name] 07/18/2023, 1:07 PM

## 2023-07-18 NOTE — Progress Notes (Signed)
 Inpatient Rehab Admissions Coordinator:  ? ?Per therapy recommendations,  patient was screened for CIR candidacy by Megan Salon, MS, CCC-SLP. At this time, Pt. Appears to be a a potential candidate for CIR. I will place   order for rehab consult per protocol for full assessment. Please contact me any with questions. ? ?Megan Salon, MS, CCC-SLP ?Rehab Admissions Coordinator  ?[phone] (celll) ?[phone] (office) ? ?

## 2023-07-18 NOTE — Progress Notes (Addendum)
 Progress Note  1 Day Post-Op  Subjective: She has had significant output from colostomy as well as started to have loose stool per rectum overnight. No nausea or abdominal pain with CLD  Objective: Vital signs in last 24 hours: Temp:  [97.8 F (36.6 C)-98.6 F (37 C)] 98.4 F (36.9 C) (03/01 0801) Pulse Rate:  [87-106] 94 (03/01 0801) Resp:  [11-18] 12 (03/01 0801) BP: (134-180)/(79-99) 180/94 (03/01 0801) SpO2:  [93 %-99 %] 93 % (03/01 0801) Weight:  [90.7 kg] 90.7 kg (02/28 1303) Last BM Date : 07/18/23  Intake/Output from previous day: 02/28 0701 - 03/01 0700 In: 1300 [I.V.:800; IV Piggyback:500] Out: 1175 [Urine:350; Stool:800; Blood:25] Intake/Output this shift: No intake/output data recorded.  PE: General: pleasant, WD, female who is laying in bed in NAD Lungs: Respiratory effort nonlabored on 3 lpm O2 Abd: soft, distended. Mild TTP over incisions without rebound or guarding. Colostomy with stoma pink and liquid stool in bag MSK: all 4 extremities are symmetrical with no cyanosis, clubbing, or edema. Skin: warm and dry Psych: A&Ox3 with an appropriate affect.    Lab Results:  Recent Labs    07/17/23 0535 07/18/23 0534  WBC 7.4 7.3  HGB 9.3* 9.3*  HCT 30.1* 29.3*  PLT 168 180   BMET Recent Labs    07/17/23 1037 07/18/23 0534  NA 141 141  K 3.2* 2.9*  CL 105 107  CO2 24 21*  GLUCOSE 73 136*  BUN 14 11  CREATININE 0.67 0.62  CALCIUM 7.9* 7.9*   PT/INR Recent Labs    07/15/23 1700  LABPROT 15.8*  INR 1.2   CMP     Component Value Date/Time   NA 141 07/18/2023 0534   NA 145 (H) 09/29/2022 0921   NA 139 02/17/2012 1337   K 2.9 (L) 07/18/2023 0534   K 4.8 02/17/2012 1337   CL 107 07/18/2023 0534   CL 103 02/17/2012 1337   CO2 21 (L) 07/18/2023 0534   CO2 25 02/17/2012 1337   GLUCOSE 136 (H) 07/18/2023 0534   GLUCOSE 112 (H) 02/17/2012 1337   BUN 11 07/18/2023 0534   BUN 25 09/29/2022 0921   BUN 32.0 (H) 02/17/2012 1337   CREATININE  0.62 07/18/2023 0534   CREATININE 1.57 (H) 09/17/2016 1438   CREATININE 1.7 (H) 02/17/2012 1337   CALCIUM 7.9 (L) 07/18/2023 0534   CALCIUM 8.9 02/17/2012 1337   PROT 4.5 (L) 07/16/2023 0337   PROT 5.4 (L) 09/29/2022 0921   PROT 6.5 02/17/2012 1337   ALBUMIN 2.1 (L) 07/17/2023 0750   ALBUMIN 3.7 (L) 09/29/2022 0921   ALBUMIN 3.3 (L) 02/17/2012 1337   AST 15 07/16/2023 0337   AST 16 02/17/2012 1337   ALT 11 07/16/2023 0337   ALT 26 02/17/2012 1337   ALKPHOS 37 (L) 07/16/2023 0337   ALKPHOS 59 02/17/2012 1337   BILITOT 0.7 07/16/2023 0337   BILITOT 0.2 09/29/2022 0921   BILITOT 0.70 02/17/2012 1337   GFRNONAA >60 07/18/2023 0534   GFRNONAA 32 (L) 09/17/2016 1438   GFRAA 49 (L) 11/10/2019 2052   GFRAA 37 (L) 09/17/2016 1438   Lipase     Component Value Date/Time   LIPASE 20 07/15/2023 1209       Studies/Results: No results found.   Anti-infectives: Anti-infectives (From admission, onward)    Start     Dose/Rate Route Frequency Ordered Stop   07/16/23 0400  metroNIDAZOLE (FLAGYL) IVPB 500 mg        500 mg  100 mL/hr over 60 Minutes Intravenous Every 12 hours 07/15/23 1918     07/15/23 2200  cefTRIAXone (ROCEPHIN) 2 g in sodium chloride 0.9 % 100 mL IVPB        2 g 200 mL/hr over 30 Minutes Intravenous Every 24 hours 07/15/23 1918     07/15/23 1545  vancomycin (VANCOCIN) IVPB 1000 mg/200 mL premix       Placed in "And" Linked Group   1,000 mg 200 mL/hr over 60 Minutes Intravenous  Once 07/15/23 1533 07/15/23 1805   07/15/23 1545  vancomycin (VANCOCIN) IVPB 1000 mg/200 mL premix       Placed in "And" Linked Group   1,000 mg 200 mL/hr over 60 Minutes Intravenous  Once 07/15/23 1533 07/15/23 1708   07/15/23 1530  ceFEPIme (MAXIPIME) 2 g in sodium chloride 0.9 % 100 mL IVPB        2 g 200 mL/hr over 30 Minutes Intravenous  Once 07/15/23 1524 07/15/23 1643   07/15/23 1530  metroNIDAZOLE (FLAGYL) IVPB 500 mg        500 mg 100 mL/hr over 60 Minutes Intravenous  Once  07/15/23 1524 07/15/23 1708   07/15/23 1530  vancomycin (VANCOCIN) IVPB 1000 mg/200 mL premix  Status:  Discontinued        1,000 mg 200 mL/hr over 60 Minutes Intravenous  Once 07/15/23 1524 07/15/23 1531        Assessment/Plan  Colonic obstruction secondary to diverticular stricture   POD1 lap assisted diverting loop colostomy Dr. Corliss Skains 2/28 - 800 ml out of colostomy so far as well as Bms per rectum - WOC RN consult for new ostomy - afebrile and WBC nml. Hgb 9.3 - continue electrolyte repletion, IV 10 meq ordered. Will order po K as well. mag pending - advance to FLD  FEN: FLD, IVF per primary ID: rocephin/flagyl - can stop VTE: lovenox start  - Per primary -  T2DM HTN HLD Gout GERD RA - on plaquenil baseline, on prednisone Chronic pain - on long acting morphine bid at baseline Neuropathy - on gabapentin at baseline CKD with AKI Bronchiectasis/ILD/chronic resp failure - on 3 lpm baseline  I reviewed last 24 h vitals and pain scores, last 48 h intake and output, last 24 h labs and trends, and last 24 h imaging results.    LOS: 2 days   Eric Form, [facility] 07/18/2023, 9:28 AM Please see Amion for pager number during day hours 7:00am-4:30pm

## 2023-07-18 NOTE — Progress Notes (Signed)
 Inpatient Rehab Admissions Coordinator:    I met with pt. To discuss potential CIR admit. She is interested, states she lives with her son who works 3rd shift. He is home with her during the day and she thinks her other children could come stay nights while he works. I will reach out to family to confirm and pursue for admit pending bed availability.   Megan Salon, MS, CCC-SLP Rehab Admissions Coordinator  [phone] (celll) [phone] (office)

## 2023-07-18 NOTE — Evaluation (Signed)
 Occupational Therapy Evaluation Patient Details Name: [name] MRN: [id number] DOB: Oct 07, 1940 Today's Date: 07/18/2023   History of Present Illness   83 y.o.  female presented 2/26 with abdominal pain and diarrhea, symptomatic sigmoid colon stricture  S/p diverting loop ostomy on 2/28.   Hx of chronic hypoxic respiratory failure on home O2-3 L, RA on chronic steroids, VTE on anticoagulation, HTN, DM-2, chronic sigmoid stricture related to diverticular diseas.     Clinical Impressions Pt admitted for above, PTA pt reports being grossly Mod I for ADLs/iADLs, using rollator in the house prior to her previous admission but now needs assist with all ADLs. Pt currently needing significant assist for bed mobility and Mod A to transfer secondary to poor stability and activity tolerance on her feet. Pt needs Total A to setup assist for ADLs, she is pleasant and determined to get stronger. OT to continue following pt acutely to address listed deficits and help transition to next level of care. Patient has the potential to reach Mod I and demos the ability to tolerate 3 hours of therapy. Pt would benefit from an intensive rehab program to help maximize functional independence.      If plan is discharge home, recommend the following:   A lot of help with bathing/dressing/bathroom;A lot of help with walking and/or transfers;Assistance with cooking/housework     Functional Status Assessment   Patient has had a recent decline in their functional status and demonstrates the ability to make significant improvements in function in a reasonable and predictable amount of time.     Equipment Recommendations   None recommended by OT (Pt reports having Rec DME')     Recommendations for Other Services         Precautions/Restrictions   Precautions Precautions: Fall Recall of Precautions/Restrictions: Intact Precaution/Restrictions Comments: ostomy Restrictions Weight Bearing  Restrictions Per Provider Order: No     Mobility Bed Mobility Overal bed mobility: Needs Assistance Bed Mobility: Supine to Sit     Supine to sit: Mod assist     General bed mobility comments: Mod A to assist with bringing BLEs to EOB    Transfers Overall transfer level: Needs assistance Equipment used: Rolling walker (2 wheels) Transfers: Sit to/from Stand, Bed to chair/wheelchair/BSC Sit to Stand: Min assist Stand pivot transfers: Mod assist         General transfer comment: Mod A to pivot to recliner, poor balance control needs close pivots, pt wants to sit prematurely. Forward flexed posture      Balance Overall balance assessment: Needs assistance Sitting-balance support: Feet supported Sitting balance-Leahy Scale: Good     Standing balance support: Bilateral upper extremity supported, During functional activity, Reliant on assistive device for balance Standing balance-Leahy Scale: Poor Standing balance comment: forward flexion due to reported spinal stenosis                           ADL either performed or assessed with clinical judgement   ADL Overall ADL's : Needs assistance/impaired Eating/Feeding: Independent;Bed level   Grooming: Sitting;Wash/dry face;Set up   Upper Body Bathing: Sitting;Minimal assistance   Lower Body Bathing: Sitting/lateral leans;Maximal assistance   Upper Body Dressing : Sitting;Minimal assistance   Lower Body Dressing: Total assistance;Sitting/lateral leans   Toilet Transfer: Rolling walker (2 wheels);Stand-pivot;Moderate assistance Toilet Transfer Details (indicate cue type and reason): sim with recliner Toileting- Clothing Manipulation and Hygiene: Sit to/from stand;Total assistance       Functional mobility during  ADLs: Moderate assistance;Rolling walker (2 wheels) (pivot to recliner')       Vision         Perception         Praxis         Pertinent Vitals/Pain Pain Assessment Pain Assessment:  No/denies pain     Extremity/Trunk Assessment Upper Extremity Assessment Upper Extremity Assessment: Generalized weakness   Lower Extremity Assessment Lower Extremity Assessment: Generalized weakness   Cervical / Trunk Assessment Cervical / Trunk Assessment: Other exceptions Cervical / Trunk Exceptions: increased body habitus   Communication Communication Communication: No apparent difficulties   Cognition Arousal: Alert Behavior During Therapy: WFL for tasks assessed/performed Cognition: No apparent impairments                               Following commands: Intact       Cueing  General Comments   Cueing Techniques: Verbal cues;Tactile cues      Exercises     Shoulder Instructions      Home Living Family/patient expects to be discharged to:: Inpatient rehab Living Arrangements: Spouse/significant other Available Help at Discharge: Family;Available 24 hours/day (aides m-f 9-1) Type of Home: House Home Access: Stairs to enter [employer] of Steps: 1 Entrance Stairs-Rails: None Home Layout: Two level;Able to live on main level with bedroom/bathroom     Bathroom Shower/Tub: Producer, television/film/video: Handicapped height     Home Equipment: Rollator (4 wheels);Wheelchair - Forensic psychologist (2 wheels);Shower seat;BSC/3in1   Additional Comments: 24/7 assist available from husband and brother-in-law who lives with patient. aide M-F      Prior Functioning/Environment Prior Level of Function : Independent/Modified Independent             Mobility Comments: rollator in the house, wheelchair community distances ADLs Comments: MOD I for ADL, assist for IADLs prior to previous admission; now requires assist for all ADL/IADL    OT Problem List: Decreased strength;Obesity;Decreased activity tolerance;Impaired balance (sitting and/or standing)   OT Treatment/Interventions: DME and/or AE instruction;Therapeutic  activities;Self-care/ADL training;Balance training;Therapeutic exercise;Energy conservation;Patient/family education      OT Goals(Current goals can be found in the care plan section)   Acute Rehab OT Goals Patient Stated Goal: To get stronger at rehab OT Goal Formulation: With patient Time For Goal Achievement: 08/01/23 Potential to Achieve Goals: Good ADL Goals Pt Will Perform Grooming: standing;with supervision Pt Will Perform Lower Body Dressing: with adaptive equipment;with supervision;sitting/lateral leans Pt Will Transfer to Toilet: with contact guard assist;stand pivot transfer;bedside commode Pt Will Perform Toileting - Clothing Manipulation and hygiene: sit to/from stand;with min assist   OT Frequency:  Min 2X/week    Co-evaluation              AM-PAC OT "6 Clicks" Daily Activity     Outcome Measure Help from another person eating meals?: None Help from another person taking care of personal grooming?: A Little Help from another person toileting, which includes using toliet, bedpan, or urinal?: A Lot Help from another person bathing (including washing, rinsing, drying)?: A Lot Help from another person to put on and taking off regular upper body clothing?: A Little Help from another person to put on and taking off regular lower body clothing?: Total 6 Click Score: 15   End of Session Equipment Utilized During Treatment: Rolling walker (2 wheels);Oxygen;Gait belt Nurse Communication: Mobility status (+2 for safety with close stand pivot transfer)  Activity Tolerance: Patient  tolerated treatment well Patient left: in chair;with call bell/phone within reach  OT Visit Diagnosis: Other abnormalities of gait and mobility (R26.89);Muscle weakness (generalized) (M62.81);Unsteadiness on feet (R26.81)                Time: 1610-9604 OT Time Calculation (min): 17 min Charges:  OT General Charges $OT Visit: 1 Visit OT Evaluation $OT Eval Moderate Complexity: 1  Mod  07/18/2023  AB, OTR/L  Acute Rehabilitation Services  Office: [phone]   Tristan Schroeder 07/18/2023, 4:39 PM

## 2023-07-19 DIAGNOSIS — K529 Noninfective gastroenteritis and colitis, unspecified: Secondary | ICD-10-CM | POA: Diagnosis not present

## 2023-07-19 DIAGNOSIS — J9611 Chronic respiratory failure with hypoxia: Secondary | ICD-10-CM | POA: Diagnosis not present

## 2023-07-19 DIAGNOSIS — E039 Hypothyroidism, unspecified: Secondary | ICD-10-CM | POA: Diagnosis not present

## 2023-07-19 DIAGNOSIS — K219 Gastro-esophageal reflux disease without esophagitis: Secondary | ICD-10-CM | POA: Diagnosis not present

## 2023-07-19 LAB — BASIC METABOLIC PANEL
Anion gap: 11 (ref 5–15)
BUN: 8 mg/dL (ref 8–23)
CO2: 23 mmol/L (ref 22–32)
Calcium: 7.7 mg/dL — ABNORMAL LOW (ref 8.9–10.3)
Chloride: 106 mmol/L (ref 98–111)
Creatinine, Ser: 0.6 mg/dL (ref 0.44–1.00)
GFR, Estimated: >60 mL/min (ref >60)
Glucose, Bld: 101 mg/dL — ABNORMAL HIGH (ref 70–99)
Potassium: 2.7 mmol/L — CL (ref 3.5–5.1)
Sodium: 140 mmol/L (ref 135–145)

## 2023-07-19 LAB — CBC
HCT: 31.2 % — ABNORMAL LOW (ref 36.0–46.0)
Hemoglobin: 9.9 g/dL — ABNORMAL LOW (ref 12.0–15.0)
MCH: 30.2 pg (ref 26.0–34.0)
MCHC: 31.7 g/dL (ref 30.0–36.0)
MCV: 95.1 fL (ref 80.0–100.0)
Platelets: 181 10*3/uL (ref 150–400)
RBC: 3.28 MIL/uL — ABNORMAL LOW (ref 3.87–5.11)
RDW: 15.8 % — ABNORMAL HIGH (ref 11.5–15.5)
WBC: 6.9 10*3/uL (ref 4.0–10.5)
nRBC: 0 % (ref 0.0–0.2)

## 2023-07-19 LAB — GLUCOSE, CAPILLARY
Glucose-Capillary: 91 mg/dL (ref 70–99)
Glucose-Capillary: 98 mg/dL (ref 70–99)
Glucose-Capillary: 117 mg/dL — ABNORMAL HIGH (ref 70–99)
Glucose-Capillary: 106 mg/dL — ABNORMAL HIGH (ref 70–99)

## 2023-07-19 MED ORDER — MAGNESIUM SULFATE 2 GM/50ML IV SOLN
2.0000 g | Freq: Once | INTRAVENOUS | Status: AC
  Administered 2023-07-19: 2 g via INTRAVENOUS
  Filled 2023-07-19: qty 50

## 2023-07-19 MED ORDER — POTASSIUM CHLORIDE 20 MEQ PO PACK
40.0000 meq | PACK | Freq: Once | ORAL | Status: AC
  Administered 2023-07-19: 40 meq via ORAL
  Filled 2023-07-19: qty 2

## 2023-07-19 MED ORDER — POTASSIUM CHLORIDE 10 MEQ/100ML IV SOLN
10.0000 meq | INTRAVENOUS | Status: AC
Start: 2023-07-19 — End: 2023-07-19
  Administered 2023-07-19 (×4): 10 meq via INTRAVENOUS
  Filled 2023-07-19 (×4): qty 100

## 2023-07-19 MED ORDER — PREDNISONE 20 MG PO TABS
20.0000 mg | ORAL_TABLET | Freq: Once | ORAL | Status: DC
  Filled 2023-07-19: qty 1

## 2023-07-19 MED ORDER — PREDNISONE 5 MG PO TABS
10.0000 mg | ORAL_TABLET | Freq: Every day | ORAL | Status: DC
  Administered 2023-07-19 – 2023-07-20 (×2): 10 mg via ORAL
  Filled 2023-07-19: qty 2

## 2023-07-19 MED ORDER — METOPROLOL SUCCINATE ER 50 MG PO TB24
50.0000 mg | ORAL_TABLET | Freq: Every day | ORAL | Status: DC
  Administered 2023-07-19 – 2023-07-20 (×2): 50 mg via ORAL
  Filled 2023-07-19 (×2): qty 1

## 2023-07-19 NOTE — Progress Notes (Signed)
 Dr. Lazarus Salines was made aware of the following:  Patient had some bursts of unsustained SVT with highest heart rate of 172. Patient current heart rate is 107 and she is stable.   He responded that he will review strips.

## 2023-07-19 NOTE — Progress Notes (Signed)
 2 Days Post-Op   Subjective/Chief Complaint: Patient feels less distended Still not much of an appetite No nausea or vomiting Continues to have gas and stool in colostomy bag   Objective: Vital signs in last 24 hours: Temp:  [97.6 F (36.4 C)-97.9 F (36.6 C)] 97.7 F (36.5 C) (03/02 0400) Pulse Rate:  [82-100] 100 (03/02 0400) Resp:  [16-18] 16 (03/02 0400) BP: (153-160)/(90-95) 153/90 (03/02 0400) SpO2:  [97 %-99 %] 99 % (03/02 0400) Last BM Date : 07/18/23  Intake/Output from previous day: 03/01 0701 - 03/02 0700 In: 200 [IV Piggyback:200] Out: 625 [Urine:500; Stool:125] Intake/Output this shift: No intake/output data recorded.  WDWN in NAD Abd - obese, soft, mildly distended Ostomy pink; brown liquid stool in bag Other incisions c/d/I - Dermabond intact  Lab Results:  Recent Labs    07/18/23 0534 07/19/23 0531  WBC 7.3 6.9  HGB 9.3* 9.9*  HCT 29.3* 31.2*  PLT 180 181   BMET Recent Labs    07/18/23 0534 07/19/23 0531  NA 141 140  K 2.9* 2.7*  CL 107 106  CO2 21* 23  GLUCOSE 136* 101*  BUN 11 8  CREATININE 0.62 0.60  CALCIUM 7.9* 7.7*     Anti-infectives: Anti-infectives (From admission, onward)    Start     Dose/Rate Route Frequency Ordered Stop   07/16/23 0400  metroNIDAZOLE (FLAGYL) IVPB 500 mg  Status:  Discontinued        500 mg 100 mL/hr over 60 Minutes Intravenous Every 12 hours 07/15/23 1918 07/18/23 1234   07/15/23 2200  cefTRIAXone (ROCEPHIN) 2 g in sodium chloride 0.9 % 100 mL IVPB  Status:  Discontinued        2 g 200 mL/hr over 30 Minutes Intravenous Every 24 hours 07/15/23 1918 07/18/23 1234   07/15/23 1545  vancomycin (VANCOCIN) IVPB 1000 mg/200 mL premix       Placed in "And" Linked Group   1,000 mg 200 mL/hr over 60 Minutes Intravenous  Once 07/15/23 1533 07/15/23 1805   07/15/23 1545  vancomycin (VANCOCIN) IVPB 1000 mg/200 mL premix       Placed in "And" Linked Group   1,000 mg 200 mL/hr over 60 Minutes Intravenous  Once  07/15/23 1533 07/15/23 1708   07/15/23 1530  ceFEPIme (MAXIPIME) 2 g in sodium chloride 0.9 % 100 mL IVPB        2 g 200 mL/hr over 30 Minutes Intravenous  Once 07/15/23 1524 07/15/23 1643   07/15/23 1530  metroNIDAZOLE (FLAGYL) IVPB 500 mg        500 mg 100 mL/hr over 60 Minutes Intravenous  Once 07/15/23 1524 07/15/23 1708   07/15/23 1530  vancomycin (VANCOCIN) IVPB 1000 mg/200 mL premix  Status:  Discontinued        1,000 mg 200 mL/hr over 60 Minutes Intravenous  Once 07/15/23 1524 07/15/23 1531       Assessment/Plan: Colonic obstruction secondary to diverticular stricture   POD 2 lap assisted diverting loop colostomy Dr. Corliss Skains 2/28 - Good ostomy output as well as Bms per rectum - WOC RN consult for new ostomy - afebrile and WBC nml. Hgb 9.9 - continue electrolyte repletion, IV 10 meq ordered. Will order po K as well.  - Continue FLD per patient request   FEN: FLD, IVF per primary ID: rocephin/flagyl - can stop VTE: lovenox start   - Per primary -  T2DM HTN HLD Gout GERD RA - on plaquenil baseline, on prednisone Chronic pain -  on long acting morphine bid at baseline Neuropathy - on gabapentin at baseline CKD with AKI Bronchiectasis/ILD/chronic resp failure - on 3 lpm baseline    LOS: 3 days    Wynona Luna 07/19/2023

## 2023-07-19 NOTE — Plan of Care (Signed)
  Problem: Education: Goal: Ability to describe self-care measures that may prevent or decrease complications (Diabetes Survival Skills Education) will improve Outcome: Progressing Goal: Individualized Educational Video(s) Outcome: Progressing   Problem: Coping: Goal: Ability to adjust to condition or change in health will improve Outcome: Progressing   Problem: Fluid Volume: Goal: Ability to maintain a balanced intake and output will improve Outcome: Progressing   Problem: Health Behavior/Discharge Planning: Goal: Ability to identify and utilize available resources and services will improve Outcome: Progressing Goal: Ability to manage health-related needs will improve Outcome: Progressing   Problem: Nutritional: Goal: Maintenance of adequate nutrition will improve Outcome: Progressing Goal: Progress toward achieving an optimal weight will improve Outcome: Progressing   Problem: Skin Integrity: Goal: Risk for impaired skin integrity will decrease Outcome: Progressing

## 2023-07-19 NOTE — PMR Pre-admission (Signed)
 PMR Admission Coordinator Pre-Admission Assessment  Patient: [name] is an 83 y.o., female MRN: [id number] DOB: June 11, 1940 Height: 5\' 3"  (160 cm) Weight: 90.7 kg  Insurance Information HMO:     PPO:      PCP:      IPA:      80/20: yes     OTHER:  PRIMARY:  Medicare A and B      Policy#: [id number] Subscriber:  CM Name:       Phone#:      Fax#:  Pre-Cert#: verified Health and safety inspector:  Benefits:  Phone #:      Name:  Eff. Date: part a and  Part B 11/17/2006   Deduct: $1632      Out of Pocket Max: n/a      Life Max: n/a CIR: 100%      SNF: 20 full days Outpatient:      Co-Pay:  Home Health: 100%      Co-Pay:  DME:      Co-Pay:  Providers:  [employer]      Policy#: [id number]      Phone#:   Financial Counselor:       Phone#:   The "Data Collection Information Summary" for patients in Inpatient Rehabilitation Facilities with attached "Privacy Act Statement-Health Care Records" was provided and verbally reviewed with: Patient  Emergency Contact Information Contact Information     Name Relation Home Work Mobile   [place] Daughter   [phone]   Irven [place] [phone]  [phone]   Aline August Daughter [phone]  [phone]   [place], Wenk Spouse [phone]  [phone]   Coral Spikes Daughter   [phone]      Other Contacts   None on File     Current Medical History  Patient Admitting Diagnosis: Colon Stricture History of Present Illness:[name] is a 83 year old right-handed female with history significant for chronic sigmoid stricture related to diverticular disease, hypertension, hyperlipidemia, diabetes mellitus with peripheral neuropathy, hypothyroidism, CKD stage III, gout, ILD, chronic respiratory failure/COPD on home oxygen with 3 L at baseline, chronic diastolic congestive heart failure, rheumatoid arthritis with chronic steroids, history of GI bleed, DVT/PE, chronic pain maintained on MS Contin 15 mg every 12  hours as well as oxycodone as needed, iron deficiency anemia, obesity with BMI 35.42, 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  Per chart review patient lives with spouse and son.  She has an aide Monday through Friday 9-1.  Two-level home bed and bath main level with one-step to entry.  Modified independent for ADLs and rollator for mobility.  Patient with recent admission for sepsis 06/30/2023 - 07/10/2023 as well as viral respiratory illness and discharged for short time to skilled nursing facility as well as recent admission 06/13/2023 - 06/26/2023 for septic shock secondary to flu A/RSV pneumonia/UTI.  Presented 07/15/2023 with abdominal pain and diarrhea with black stools as well as hypotension.  She was given IV fluids with improvement in blood pressure.  Admission chemistries unremarkable except WBC 11,900, hemoglobin 11.8, potassium 3.4, CO2 21, creatinine 1.09, lipase of 20, lactic acid 3.1, blood cultures no growth to date, urinalysis negative nitrite.  Guaiac positive and noted low-grade fever.  Code sepsis was initiated.  Cranial CT scan negative.  CT of the abdomen pelvis showed dilatation of colon-abrupt change in caliber noted involving the sigmoid colon-likely stricture.  General surgery consulted for large bowel obstruction secondary to distal sigmoid stenosis undergoing laparoscopic  assisted diverting loop colostomy 07/17/2023 per Dr.Tsuei.  Initially maintained on broad-spectrum antibiotics.  Blood pressure remained monitored initially managed with stress dose IV hydrocortisone/midodrine with significant improvement and midodrine has been discontinued transition hydrocortisone to prednisone.  Patient's hemoglobin and hematocrit remained stable.  Rheumatoid arthritis currently with Plaquenil/Leflunomide on hold.  Patient's prednisone has been resumed.  She did have a run of SVT and Toprol has been adjusted.  Subcutaneous Lovenox for DVT prophylaxis.   She is currently on a full liquid diet advanced as tolerated.  Therapy evaluations completed due to patient's decreased functional mobility was admitted for a comprehensive rehab program .          Patient's medical record from [facility]  has been reviewed by the rehabilitation admission coordinator and physician.  Past Medical History  Past Medical History:  Diagnosis Date   Acute encephalopathy 03/30/2021   11/12 - 04/03/2021 acute encephalopathy in the context of altered mental status and poor oral intake following increasing pain medicines and associated Klebsiella UTI.     Acute kidney injury superimposed on chronic kidney disease (HCC) 04/17/2015   11/12 - 04/03/2021 AKI with creatinine of 2.22 and GFR 22 indicating stage IV renal disease in the context of encephalopathy secondary to increased pain medications with decreased oral intake of fluids and nutrition.     Acute on chronic diastolic (congestive) heart failure (HCC)    Anemia    iron deficiency anemia - secondary to blood loss ( chronic)    Anxiety    Arthritis    endstage changes bilateral knees/bilateral ankles.    Asthma    Carotid artery occlusion    Chest pain of uncertain etiology 01/28/2012   Normal coronaries at cath     Chronic fatigue    Chronic kidney disease    Closed intertrochanteric fracture of hip, left, initial encounter (HCC) 11/29/2016   Closed left hip fracture (HCC)    Closed nondisplaced intertrochanteric fracture of left femur with delayed healing 01/14/2017   Clotting disorder (HCC)    pt denies this   Contusion of left knee    due to fall 1/14.   COPD (chronic obstructive pulmonary disease) (HCC)    pulmonary fibrosis   Coronary artery disease    Depression, reactive    Diabetes mellitus    type II    Diastolic dysfunction    Difficulty in walking    Family history of heart disease    Flu 06/26/2023   Generalized muscle weakness    Gout    High cholesterol     History of falling    Hypertension    Hypothyroidism    Interstitial lung disease (HCC)    Meningioma of left sphenoid wing involving cavernous sinus (HCC) 02/17/2012   Continue diplopia, left eye pain and left headaches.     Morbid obesity (HCC)    Neuromuscular disorder (HCC)    diabetic neuropathy    Normal coronary arteries    cardiac catheterization performed  10/31/14   Pneumonia    RA (rheumatoid arthritis) (HCC)    has been off methotreaxte since 10/13.   Septic shock (HCC) 06/13/2023   Spinal stenosis of lumbar region    Thyroid disease    Unspecified lack of coordination    URI (upper respiratory infection)    UTI (urinary tract infection) 03/31/2021   11/12 - 04/03/2021 Klebsiella UTI associated with encephalopathy in the context of AKI.  Status post IV ceftriaxone followed by oral Keflex x48  hours.      Has the patient had major surgery during 100 days prior to admission? Yes  Family History   family history includes Breast cancer in her sister and sister; Diabetes in her brother, mother, and sister; Esophageal cancer in her brother; Fibroids in her daughter; Gout in her brother; Heart attack in her brother and mother; Heart disease in her brother; Heart murmur in her son; Hypertension in her brother, daughter, daughter, father, and son; Kidney cancer in her brother; Kidney failure in her brother; Lung cancer in her father; Prostate cancer in her brother; Rheum arthritis in her maternal uncle; Stomach cancer in her brother; Uterine cancer in her daughter.  Current Medications  Current Facility-Administered Medications:    acetaminophen (TYLENOL) tablet 1,000 mg, 1,000 mg, Oral, Q6H PRN, 1,000 mg at 07/19/23 0558 **OR** acetaminophen (TYLENOL) suppository 650 mg, 650 mg, Rectal, Q6H PRN, Kabrich, Martha H, PA-C   albuterol (VENTOLIN HFA) 108 (90 Base) MCG/ACT inhaler 2 puff, 2 puff, Inhalation, Q2H PRN, Kabrich, Martha H, PA-C   atorvastatin (LIPITOR) tablet 10 mg, 10 mg,  Oral, Daily, Eric Form, PA-C, 10 mg at 07/19/23 1610   diclofenac Sodium (VOLTAREN) 1 % topical gel 2 g, 2 g, Topical, TID, Kabrich, Martha H, PA-C, 2 g at 07/19/23 0944   DULoxetine (CYMBALTA) DR capsule 60 mg, 60 mg, Oral, Daily, Eric Form, PA-C, 60 mg at 07/19/23 0938   enoxaparin (LOVENOX) injection 40 mg, 40 mg, Subcutaneous, Q24H, Eric Form, PA-C, 40 mg at 07/19/23 0941   feeding supplement (BOOST / RESOURCE BREEZE) liquid 1 Container, 1 Container, Oral, TID BM, Eric Form, PA-C, 1 Container at 07/19/23 0944   gabapentin (NEURONTIN) capsule 300 mg, 300 mg, Oral, BID, Eric Form, PA-C, 300 mg at 07/19/23 9604   guaiFENesin (MUCINEX) 12 hr tablet 1,200 mg, 1,200 mg, Oral, BID, Eric Form, PA-C, 1,200 mg at 07/19/23 5409   insulin aspart (novoLOG) injection 0-6 Units, 0-6 Units, Subcutaneous, TID WC, Kabrich, Martha H, PA-C   levothyroxine (SYNTHROID) tablet 50 mcg, 50 mcg, Oral, Daily, Eric Form, PA-C, 50 mcg at 07/19/23 0558   lidocaine (LIDODERM) 5 % 1 patch, 1 patch, Transdermal, Q24H, Eric Form, PA-C, 1 patch at 07/19/23 8119   magnesium sulfate IVPB 2 g 50 mL, 2 g, Intravenous, Once, Ghimire, Werner Lean, MD, Last Rate: 50 mL/hr at 07/19/23 1305, 2 g at 07/19/23 1305   menthol-cetylpyridinium (CEPACOL) lozenge 3 mg, 1 lozenge, Oral, PRN, Dolly Rias, MD, 3 mg at 07/18/23 2119   methocarbamol (ROBAXIN) tablet 500 mg, 500 mg, Oral, Q6H PRN, Eric Form, PA-C   metoprolol succinate (TOPROL-XL) 24 hr tablet 50 mg, 50 mg, Oral, Daily, Ghimire, Werner Lean, MD, 50 mg at 07/19/23 0936   morphine (MS CONTIN) 12 hr tablet 15 mg, 15 mg, Oral, Q12H, Eric Form, PA-C, 15 mg at 07/19/23 0940   morphine (PF) 2 MG/ML injection 1 mg, 1 mg, Intravenous, Q3H PRN, Eric Form, PA-C   ondansetron [facility]) injection 4 mg, 4 mg, Intravenous, Q8H PRN, Eric Form, PA-C, 4 mg at 07/15/23 2354   oxyCODONE (Oxy IR/ROXICODONE)  immediate release tablet 5 mg, 5 mg, Oral, Q6H PRN, Eric Form, PA-C, 5 mg at 07/18/23 1643   pantoprazole (PROTONIX) EC tablet 40 mg, 40 mg, Oral, BID, Ghimire, Werner Lean, MD, 40 mg at 07/19/23 0940   [START ON 07/20/2023] predniSONE (DELTASONE) tablet 10 mg, 10 mg, Oral, Q breakfast, Ghimire, [employer]  M, MD, 10 mg at 07/19/23 1610   predniSONE (DELTASONE) tablet 20 mg, 20 mg, Oral, Once, Ghimire, Werner Lean, MD   sodium chloride (OCEAN) 0.65 % nasal spray 1 spray, 1 spray, Each Nare, PRN, Dolly Rias, MD, 1 spray at 07/18/23 9604   sodium chloride flush (NS) 0.9 % injection 3 mL, 3 mL, Intravenous, Q12H, Eric Form, PA-C, 3 mL at 07/18/23 2119  Patients Current Diet:  Diet Order             Diet full liquid Room service appropriate? Yes; Fluid consistency: Thin  Diet effective now                   Precautions / Restrictions Precautions Precautions: Fall Precaution/Restrictions Comments: ostomy Restrictions Weight Bearing Restrictions Per Provider Order: No   Has the patient had 2 or more falls or a fall with injury in the past year? Yes  Prior Activity Level Community (5-7x/wk): Pt. active in the community PTA  Prior Functional Level Self Care: Did the patient need help bathing, dressing, using the toilet or eating? Independent  Indoor Mobility: Did the patient need assistance with walking from room to room (with or without device)? Independent  Stairs: Did the patient need assistance with internal or external stairs (with or without device)? Independent  Functional Cognition: Did the patient need help planning regular tasks such as shopping or remembering to take medications? Independent  Patient Information Are you of Hispanic, Latino/a,or Spanish origin?: A. No, not of Hispanic, Latino/a, or Spanish origin What is your race?: B. Black or African American Do you need or want an interpreter to communicate with a doctor or health care staff?: 0.  No  Patient's Response To:  Health Literacy and Transportation Is the patient able to respond to health literacy and transportation needs?: Yes Health Literacy - How often do you need to have someone help you when you read instructions, pamphlets, or other written material from your doctor or pharmacy?: Never In the past 12 months, has lack of transportation kept you from medical appointments or from getting medications?: No In the past 12 months, has lack of transportation kept you from meetings, work, or from getting things needed for daily living?: No  Home Assistive Devices / Equipment Home Equipment: Occupational hygienist (4 wheels), Wheelchair - manual, Agricultural consultant (2 wheels), Shower seat, BSC/3in1  Prior Device Use: Indicate devices/aids used by the patient prior to current illness, exacerbation or injury? None of the above  Current Functional Level Cognition  Overall Cognitive Status: Within Functional Limits for tasks assessed Difficult to assess due to: Impaired communication Orientation Level: Oriented X4 General Comments: Appeared to be [facility]    Extremity Assessment (includes Sensation/Coordination)  Upper Extremity Assessment: Generalized weakness  Lower Extremity Assessment: Generalized weakness    ADLs  Overall ADL's : Needs assistance/impaired Eating/Feeding: Independent, Bed level Grooming: Sitting, Wash/dry face, Set up Upper Body Bathing: Sitting, Minimal assistance Lower Body Bathing: Sitting/lateral leans, Maximal assistance Upper Body Dressing : Sitting, Minimal assistance Lower Body Dressing: Total assistance, Sitting/lateral leans Toilet Transfer: Rolling walker (2 wheels), Stand-pivot, Moderate assistance Toilet Transfer Details (indicate cue type and reason): sim with recliner Toileting- Clothing Manipulation and Hygiene: Sit to/from stand, Total assistance Functional mobility during ADLs: Moderate assistance, Rolling walker (2 wheels) (pivot to recliner')     Mobility  Overal bed mobility: Needs Assistance Bed Mobility: Supine to Sit Rolling: Min assist Sidelying to sit: HOB elevated, Used rails, Min assist Supine to sit: Mod assist  General bed mobility comments: Mod A to assist with bringing BLEs to EOB    Transfers  Overall transfer level: Needs assistance Equipment used: Rolling walker (2 wheels) Transfers: Sit to/from Stand, Bed to chair/wheelchair/BSC Sit to Stand: Min assist Bed to/from chair/wheelchair/BSC transfer type:: Stand pivot Stand pivot transfers: Mod assist General transfer comment: Mod A to pivot to recliner, poor balance control needs close pivots, pt wants to sit prematurely. Forward flexed posture    Ambulation / Gait / Stairs / Wheelchair Mobility  Ambulation/Gait Ambulation/Gait assistance: Editor, commissioning (Feet): 3 Feet Assistive device: Rolling walker (2 wheels) Gait Pattern/deviations: Step-to pattern General Gait Details: Cues for upright posture, able to march in place and progress with lateral steps along bed. HR did jump to 150s briefly but recovers to low 100s with seated rest. Lt knee a little unstable but no overt buckling and when pt able to extend arms on RW as instructed, stabilizes quite well. Gait velocity: decreased Pre-gait activities: Weight shift, static march, upright posture in RW with BIL UE support. at [facility] level    Posture / Balance Balance Overall balance assessment: Needs assistance Sitting-balance support: Feet supported Sitting balance-Leahy Scale: Good Standing balance support: Bilateral upper extremity supported, During functional activity, Reliant on assistive device for balance Standing balance-Leahy Scale: Poor Standing balance comment: forward flexion due to reported spinal stenosis    Special needs/care consideration Special service needs none    Previous Home Environment (from acute therapy documentation) Living Arrangements: Spouse/significant other Available Help  at Discharge: Family, Available 24 hours/day (aides m-f 9-1) Type of Home: House Home Layout: Two level, Able to live on main level with bedroom/bathroom Home Access: Stairs to enter Entrance Stairs-Rails: None Entrance Stairs-Number of Steps: 1 Bathroom Shower/Tub: Health visitor: Handicapped height Bathroom Accessibility: Yes Home Care Services: No Additional Comments: 24/7 assist available from husband and brother-in-law who lives with patient. aide M-F  Discharge Living Setting Plans for Discharge Living Setting: Patient's home Type of Home at Discharge: House Discharge Home Layout: One level Discharge Home Access: Stairs to enter Entrance Stairs-Rails: None Entrance Stairs-Number of Steps: 1 Discharge Bathroom Shower/Tub: Walk-in shower Discharge Bathroom Toilet: Handicapped height Discharge Bathroom Accessibility: Yes How Accessible: Other (comment), Accessible via walker Does the patient have any problems obtaining your medications?: No  Social/Family/Support Systems Patient Roles: Spouse, Other (Comment) Contact Information: [phone] Anticipated Caregiver: 24/7 Anticipated Caregiver's Contact Information: Pt. and husband live with her husband live with son Lyda Jester. He works 3rd shift. husband can provide 24/7 supervision and other family may stay at nights. Ability/Limitations of Caregiver: Min-mod A Caregiver Availability: 24/7 Discharge Plan Discussed with Primary Caregiver: Yes Is Caregiver In Agreement with Plan?: Yes Does Caregiver/Family have Issues with Lodging/Transportation while Pt is in Rehab?: Yes  Goals Patient/Family Goal for Rehab: PT/OT Min A to Supervision Expected length of stay: 12-14 days Pt/Family Agrees to Admission and willing to participate: Yes Program Orientation Provided & Reviewed with Pt/Caregiver Including Roles  & Responsibilities: Yes  Decrease burden of Care through IP rehab admission: not anticipated  Possible  need for SNF placement upon discharge: not anticipated   Patient Condition: I have reviewed medical records from [facility] , spoken with CM, and patient and family member. I met with patient at the bedside for inpatient rehabilitation assessment.  Patient will benefit from ongoing PT, OT, and SLP, can actively participate in 3 hours of therapy a day 5 days of the week, and can make measurable gains  during the admission.  Patient will also benefit from the coordinated team approach during an Inpatient Acute Rehabilitation admission.  The patient will receive intensive therapy as well as Rehabilitation physician, nursing, social worker, and care management interventions.  Due to safety, skin/wound care, disease management, medication administration, pain management, and patient education the patient requires 24 hour a day rehabilitation nursing.  The patient is currently min A-max A  with mobility and basic ADLs.  Discharge setting and therapy post discharge at home with home health is anticipated.  Patient has agreed to participate in the Acute Inpatient Rehabilitation Program and will admit today.  Preadmission Screen Completed By:  Jeronimo Greaves, 07/19/2023 1:28 PM ______________________________________________________________________   Discussed status with Dr. Skip Estimable  on 07/20/23 at 900 and received approval for admission today.  Admission Coordinator:  Jeronimo Greaves, CCC-SLP, time 930/Date 07/20/23   Assessment/Plan: Diagnosis:  septic shock/debility due to symptomatic chronic sigmoid colon stricture  Does the need for close, 24 hr/day Medical supervision in concert with the patient's rehab needs make it unreasonable for this patient to be served in a less intensive setting? Yes Co-Morbidities requiring supervision/potential complications: Rheumatoid arthritis, DM type 2, COPD, Chronic Diastolic heart failure, CKD III, iron deficiency anemia, obesity, history of meningioma,  chronic respiratory failure, hypothyroidism, hyperlipidemia Due to bladder management, bowel management, safety, skin/wound care, disease management, medication administration, pain management, and patient education, does the patient require 24 hr/day rehab nursing? Yes Does the patient require coordinated care of a physician, rehab nurse, PT, OT, and SLP to address physical and functional deficits in the context of the above medical diagnosis(es)? Yes Addressing deficits in the following areas: balance, endurance, locomotion, strength, transferring, bowel/bladder control, bathing, dressing, feeding, grooming, toileting, cognition, speech, language, swallowing, and psychosocial support Can the patient actively participate in an intensive therapy program of at least 3 hrs of therapy 5 days a week? Yes The potential for patient to make measurable gains while on inpatient rehab is excellent Anticipated functional outcomes upon discharge from inpatient rehab: supervision PT, supervision OT, supervision SLP Estimated rehab length of stay to reach the above functional goals is: 12-14 Anticipated discharge destination: Home 10. Overall Rehab/Functional Prognosis: excellent   MD Signature: Fanny Dance

## 2023-07-19 NOTE — Plan of Care (Signed)
 Patient is alert and oriented. Patient colostomy is draining well. Patient denies any pain other than the occasional headaches from IV steroid medication. Patient stable at this time.

## 2023-07-19 NOTE — Progress Notes (Addendum)
 Inpatient Rehab Admissions Coordinator:    I spoke with Pt.'s son regarding CIR and support at d/c. He confirms that while Pt. Was recent at SNF doing rehab, she and her husband live with Him Lyda Jester). He states that he does want Pt. To come to CIR and then d/c home with him. Pt.'s spouse can provide supervision only, but Lyda Jester is home during the days as he works night shift. He states he will speak with other family about coming to stay nights with Pt. I will follow for potential admit pending insurance auth.   Megan Salon, MS, CCC-SLP Rehab Admissions Coordinator  [phone] (celll) [phone] (office)

## 2023-07-19 NOTE — Progress Notes (Addendum)
 PROGRESS NOTE        PATIENT DETAILS Name: [name] Age: 83 y.o. Sex: female Date of Birth: February 08, 1941 Admit Date: 07/15/2023 Admitting Physician [name], MD FAO:[id number], [name], MD  Brief Summary: Patient is a 83 y.o.  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  Significant events: 2/26>> admit to [facility]  Significant studies: 1/27>> echo: EF 60-65%, grade 1 diastolic dysfunction. 2/26>> CT head: No acute intracranial abnormality 2/26>> CT abdomen/pelvis: Dilatation of colon-abrupt change in caliber noted involving the sigmoid colon-likely stricture.  Significant microbiology data: 2/26>> COVID/influenza/RSV PCR: Negative 2/26>> blood culture: No growth  Procedures: None  Consults: GI CCS  Subjective: Lying comfortably in bed-no major issues overnight.  Denies any chest pain or shortness of breath.  Objective: Vitals: Blood pressure (!) 163/103, pulse 98, temperature 98 F (36.7 C), temperature source Oral, resp. rate 15, height 5\' 3"  (1.6 m), weight 90.7 kg, SpO2 99%.   Exam: Gen Exam:Alert awake-not in any distress HEENT:atraumatic, normocephalic Chest: B/L clear to auscultation anteriorly CVS:S1S2 regular Abdomen:soft non tender, non distended-watery stool in ostomy. Extremities:no edema Neurology: Non focal-but with generalized weakness. Skin: no rash  Pertinent Labs/Radiology:    Latest Ref Rng & Units 07/19/2023    5:31 AM 07/18/2023    5:34 AM 07/17/2023    5:35 AM  CBC  WBC 4.0 - 10.5 K/uL 6.9  7.3  7.4   Hemoglobin 12.0 - 15.0 g/dL 9.9  9.3  9.3   Hematocrit 36.0 - 46.0 % 31.2  29.3  30.1   Platelets 150 - 400 K/uL 181  180  168     Lab Results  Component Value Date   NA 140 07/19/2023   K 2.7 (LL) 07/19/2023   CL  106 07/19/2023   CO2 23 07/19/2023      Assessment/Plan: Left lower quadrant abdominal pain-diarrhea due to symptomatic sigmoid colon stricture S/p diverting loop ostomy on 2/28 Diet being advanced-General Surgery following Mobilize with PT/OT At this point-not felt to have infectious diarrhea-has all antibiotics discontinued on 3/1  Hypotension Secondary to hypovolemia and adrenal insufficiency/crisis (on chronic steroids) Managed with stress dose IV hydrocortisone/midodrine-with significant improvement. BP now on the higher side-stop midodrine-transition hydrocortisone to prednisone  FOBT positive stools No active bleeding noted GI not recommending inpatient colonoscopy Hb stable-follow closely Outpatient GI follow plan posthospital discharge.  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  HTN BP remains on the higher side Increase metoprolol to 50 mg daily Stop midodrine Off stress dose steroids Restart other antihypertensives based on how she does over the next day or so.  Run of SVT overnight Telemetry monitoring Beta-blocker dosage increased.  HLD Statin  DM-2 (A1c 5.6 on 1/26) SSI-CBG stable.  Recent Labs    07/18/23 1622 07/18/23 2148 07/19/23 0813  GLUCAP 127* 128* 91     History of VTE SQ Lovenox at prophylactic dose.    mobilize as much as possible.  Hypokalemia Continue to replete/recheck  Hypothyroidism Levothyroxine   Peripheral neuropathy Neurontin  Chronic pain syndrome On narcotics-MS Contin 15 mg every 12 hours-and as needed oxycodone.  Debility/deconditioning  PT/OT  Class 2 Obesity/Underweight: Estimated body mass index is 35.42 kg/m as calculated from the following:   Height as of this encounter: 5\' 3"  (1.6 m).   Weight as of this encounter: 90.7 kg.   Code status:   Code  Status: Full Code   DVT Prophylaxis: enoxaparin (LOVENOX) injection 40 mg Start: 07/18/23 1030 SCDs Start: 07/15/23 1914   Family Communication: Daughter at bedside.   Disposition Plan: Status is: Inpatient Remains inpatient appropriate because: Severity of illness   Planned Discharge Destination:Home health   Diet: Diet Order             Diet full liquid Room service appropriate? Yes; Fluid consistency: Thin  Diet effective now                     Antimicrobial agents: Anti-infectives (From admission, onward)    Start     Dose/Rate Route Frequency Ordered Stop   07/16/23 0400  metroNIDAZOLE (FLAGYL) IVPB 500 mg  Status:  Discontinued        500 mg 100 mL/hr over 60 Minutes Intravenous Every 12 hours 07/15/23 1918 07/18/23 1234   07/15/23 2200  cefTRIAXone (ROCEPHIN) 2 g in sodium chloride 0.9 % 100 mL IVPB  Status:  Discontinued        2 g 200 mL/hr over 30 Minutes Intravenous Every 24 hours 07/15/23 1918 07/18/23 1234   07/15/23 1545  vancomycin (VANCOCIN) IVPB 1000 mg/200 mL premix       Placed in "And" Linked Group   1,000 mg 200 mL/hr over 60 Minutes Intravenous  Once 07/15/23 1533 07/15/23 1805   07/15/23 1545  vancomycin (VANCOCIN) IVPB 1000 mg/200 mL premix       Placed in "And" Linked Group   1,000 mg 200 mL/hr over 60 Minutes Intravenous  Once 07/15/23 1533 07/15/23 1708   07/15/23 1530  ceFEPIme (MAXIPIME) 2 g in sodium chloride 0.9 % 100 mL IVPB        2 g 200 mL/hr over 30 Minutes Intravenous  Once 07/15/23 1524 07/15/23 1643   07/15/23 1530  metroNIDAZOLE (FLAGYL) IVPB 500 mg        500 mg 100 mL/hr over 60 Minutes Intravenous  Once 07/15/23 1524 07/15/23 1708   07/15/23 1530  vancomycin (VANCOCIN) IVPB 1000 mg/200 mL premix  Status:  Discontinued        1,000 mg 200 mL/hr over 60 Minutes Intravenous  Once 07/15/23 1524 07/15/23 1531        MEDICATIONS: Scheduled Meds:  atorvastatin  10 mg Oral Daily   diclofenac Sodium  2 g Topical TID    DULoxetine  60 mg Oral Daily   enoxaparin (LOVENOX) injection  40 mg Subcutaneous Q24H   feeding supplement  1 Container Oral TID BM   gabapentin  300 mg Oral BID   guaiFENesin  1,200 mg Oral BID   insulin aspart  0-6 Units Subcutaneous TID WC   levothyroxine  50 mcg Oral Daily   lidocaine  1 patch Transdermal Q24H   metoprolol succinate  50 mg Oral Daily   morphine  15 mg Oral Q12H   pantoprazole  40 mg Oral BID   [START ON 07/20/2023] predniSONE  10 mg Oral Q breakfast   predniSONE  20 mg Oral Once   sodium chloride flush  3 mL Intravenous Q12H   Continuous Infusions:  magnesium sulfate bolus IVPB     potassium chloride 10 mEq (07/19/23 0959)   PRN Meds:.acetaminophen **OR**  acetaminophen, albuterol, menthol-cetylpyridinium, methocarbamol, morphine injection, ondansetron (ZOFRAN) IV, oxyCODONE, sodium chloride   I have personally reviewed following labs and imaging studies  LABORATORY DATA: CBC: Recent Labs  Lab 07/15/23 1209 07/15/23 1215 07/15/23 2355 07/16/23 0337 07/17/23 0535 07/18/23 0534 07/19/23 0531  WBC 11.9*  --  13.6* 12.7* 7.4 7.3 6.9  NEUTROABS 3.9  --   --   --  2.0  --   --   HGB 11.8*   < > 11.1* 11.1* 9.3* 9.3* 9.9*  HCT 38.7   < > 37.2 36.7 30.1* 29.3* 31.2*  MCV 99.7  --  100.3* 99.5 97.7 96.4 95.1  PLT 207  --  196 200 168 180 181   < > = values in this interval not displayed.    Basic Metabolic Panel: Recent Labs  Lab 07/15/23 2355 07/16/23 0337 07/16/23 1659 07/17/23 0750 07/17/23 1037 07/18/23 0534 07/19/23 0531  NA  --  140  --  141 141 141 140  K  --  3.8  --  3.2* 3.2* 2.9* 2.7*  CL  --  108  --  107 105 107 106  CO2  --  21*  --  25 24 21* 23  GLUCOSE  --  96  --  77 73 136* 101*  BUN  --  15  --  13 14 11 8   CREATININE  --  0.88  --  0.65 0.67 0.62 0.60  CALCIUM  --  8.0*  --  7.9* 7.9* 7.9* 7.7*  MG 1.5* 1.4*  --   --  1.8 1.7  --   PHOS  --   --  2.1* 2.1*  --   --   --     GFR: Estimated Creatinine Clearance: 58.9  mL/min (by C-G formula based on SCr of 0.6 mg/dL).  Liver Function Tests: Recent Labs  Lab 07/15/23 1209 07/16/23 0337 07/17/23 0750  AST 15 15  --   ALT 11 11  --   ALKPHOS 45 37*  --   BILITOT 0.6 0.7  --   PROT 5.2* 4.5*  --   ALBUMIN 2.5* 2.1* 2.1*   Recent Labs  Lab 07/15/23 1209  LIPASE 20   No results for input(s): "AMMONIA" in the last 168 hours.  Coagulation Profile: Recent Labs  Lab 07/15/23 1700  INR 1.2    Cardiac Enzymes: No results for input(s): "CKTOTAL", "CKMB", "CKMBINDEX", "TROPONINI" in the last 168 hours.  BNP (last 3 results) No results for input(s): "PROBNP" in the last 8760 hours.  Lipid Profile: No results for input(s): "CHOL", "HDL", "LDLCALC", "TRIG", "CHOLHDL", "LDLDIRECT" in the last 72 hours.  Thyroid Function Tests: No results for input(s): "TSH", "T4TOTAL", "FREET4", "T3FREE", "THYROIDAB" in the last 72 hours.  Anemia Panel: No results for input(s): "VITAMINB12", "FOLATE", "FERRITIN", "TIBC", "IRON", "RETICCTPCT" in the last 72 hours.  Urine analysis:    Component Value Date/Time   COLORURINE YELLOW 07/15/2023 1616   APPEARANCEUR HAZY (A) 07/15/2023 1616   LABSPEC >1.046 (H) 07/15/2023 1616   PHURINE 5.0 07/15/2023 1616   GLUCOSEU NEGATIVE 07/15/2023 1616   HGBUR NEGATIVE 07/15/2023 1616   BILIRUBINUR NEGATIVE 07/15/2023 1616   BILIRUBINUR 1+ 01/30/2021 1450   KETONESUR 5 (A) 07/15/2023 1616   PROTEINUR NEGATIVE 07/15/2023 1616   UROBILINOGEN negative (A) 01/30/2021 1450   UROBILINOGEN 0.2 11/28/2019 1424   NITRITE NEGATIVE 07/15/2023 1616   LEUKOCYTESUR NEGATIVE 07/15/2023 1616    Sepsis Labs: Lactic Acid, Venous    Component Value Date/Time  LATICACIDVEN 1.0 07/16/2023 0557    MICROBIOLOGY: Recent Results (from the past 240 hours)  Culture, blood (routine x 2)     Status: None (Preliminary result)   Collection Time: 07/15/23  3:00 PM   Specimen: BLOOD  Result Value Ref Range Status   Specimen Description  BLOOD LEFT ANTECUBITAL  Final   Special Requests   Final    BOTTLES DRAWN AEROBIC AND ANAEROBIC Blood Culture results may not be optimal due to an inadequate volume of blood received in culture bottles   Culture   Final    NO GROWTH 4 DAYS Performed at [facility], [address], [place], [place] [address]    Report Status PENDING  Incomplete  Culture, blood (routine x 2)     Status: None (Preliminary result)   Collection Time: 07/15/23  3:00 PM   Specimen: BLOOD  Result Value Ref Range Status   Specimen Description BLOOD RIGHT ANTECUBITAL  Final   Special Requests   Final    BOTTLES DRAWN AEROBIC AND ANAEROBIC Blood Culture results may not be optimal due to an inadequate volume of blood received in culture bottles   Culture   Final    NO GROWTH 4 DAYS Performed at [facility], [address] [place], [place] [address]    Report Status PENDING  Incomplete  Resp panel by RT-PCR (RSV, Flu A&B, Covid) Anterior Nasal Swab     Status: None   Collection Time: 07/15/23  4:16 PM   Specimen: Anterior Nasal Swab  Result Value Ref Range Status   SARS Coronavirus 2 by RT PCR NEGATIVE NEGATIVE Final   Influenza A by PCR NEGATIVE NEGATIVE Final   Influenza B by PCR NEGATIVE NEGATIVE Final    Comment: (NOTE) The Xpert Xpress SARS-CoV-2/FLU/RSV plus assay is intended as an aid in the diagnosis of influenza from Nasopharyngeal swab specimens and should not be used as a sole basis for treatment. Nasal washings and aspirates are unacceptable for Xpert Xpress SARS-CoV-2/FLU/RSV testing.  Fact Sheet for Patients: BloggerCourse.com  Fact Sheet for Healthcare Providers: SeriousBroker.it  This test is not yet approved or cleared by the [place] FDA and has been authorized for detection and/or diagnosis of SARS-CoV-2 by FDA under an Emergency Use Authorization (EUA). This EUA will remain in effect (meaning this test can be used)  for the duration of the COVID-19 declaration under Section 564(b)(1) of the Act, 21 U.S.C. section 360bbb-3(b)(1), unless the authorization is terminated or revoked.     Resp Syncytial Virus by PCR NEGATIVE NEGATIVE Final    Comment: (NOTE) Fact Sheet for Patients: BloggerCourse.com  Fact Sheet for Healthcare Providers: SeriousBroker.it  This test is not yet approved or cleared by the [place] FDA and has been authorized for detection and/or diagnosis of SARS-CoV-2 by FDA under an Emergency Use Authorization (EUA). This EUA will remain in effect (meaning this test can be used) for the duration of the COVID-19 declaration under Section 564(b)(1) of the Act, 21 U.S.C. section 360bbb-3(b)(1), unless the authorization is terminated or revoked.  Performed at [facility], [address], [place], [place] [address]     RADIOLOGY STUDIES/RESULTS: No results found.    LOS: 3 days   Jeoffrey Massed, MD  [facility]    To contact the attending provider between 7A-7P or the covering provider during after hours 7P-7A, please log into the web site [email or web address] and access using universal [place] password for that web site. If you  do not have the password, please call the hospital operator.  07/19/2023, 10:43 AM

## 2023-07-20 ENCOUNTER — Encounter (HOSPITAL_COMMUNITY): Payer: Self-pay | Admitting: Physical Medicine and Rehabilitation

## 2023-07-20 ENCOUNTER — Other Ambulatory Visit: Payer: Self-pay

## 2023-07-20 ENCOUNTER — Inpatient Hospital Stay (HOSPITAL_COMMUNITY)
Admission: AD | Admit: 2023-07-20 | Discharge: 2023-08-04 | DRG: 945 | Disposition: A | Discharge status: Skilled Nursing Facility | Source: Intra-hospital | Attending: Physical Medicine and Rehabilitation | Admitting: Physical Medicine and Rehabilitation

## 2023-07-20 DIAGNOSIS — M6281 Muscle weakness (generalized): Secondary | ICD-10-CM | POA: Diagnosis not present

## 2023-07-20 DIAGNOSIS — E1122 Type 2 diabetes mellitus with diabetic chronic kidney disease: Secondary | ICD-10-CM | POA: Diagnosis present

## 2023-07-20 DIAGNOSIS — E2740 Unspecified adrenocortical insufficiency: Secondary | ICD-10-CM | POA: Diagnosis present

## 2023-07-20 DIAGNOSIS — E039 Hypothyroidism, unspecified: Secondary | ICD-10-CM | POA: Diagnosis present

## 2023-07-20 DIAGNOSIS — J4489 Other specified chronic obstructive pulmonary disease: Secondary | ICD-10-CM | POA: Diagnosis present

## 2023-07-20 DIAGNOSIS — J9610 Chronic respiratory failure, unspecified whether with hypoxia or hypercapnia: Secondary | ICD-10-CM | POA: Diagnosis present

## 2023-07-20 DIAGNOSIS — Z8049 Family history of malignant neoplasm of other genital organs: Secondary | ICD-10-CM

## 2023-07-20 DIAGNOSIS — Z8249 Family history of ischemic heart disease and other diseases of the circulatory system: Secondary | ICD-10-CM

## 2023-07-20 DIAGNOSIS — M069 Rheumatoid arthritis, unspecified: Secondary | ICD-10-CM | POA: Diagnosis not present

## 2023-07-20 DIAGNOSIS — R41841 Cognitive communication deficit: Secondary | ICD-10-CM | POA: Diagnosis not present

## 2023-07-20 DIAGNOSIS — Z8042 Family history of malignant neoplasm of prostate: Secondary | ICD-10-CM

## 2023-07-20 DIAGNOSIS — Z86711 Personal history of pulmonary embolism: Secondary | ICD-10-CM | POA: Diagnosis not present

## 2023-07-20 DIAGNOSIS — E7800 Pure hypercholesterolemia, unspecified: Secondary | ICD-10-CM | POA: Diagnosis present

## 2023-07-20 DIAGNOSIS — Z794 Long term (current) use of insulin: Secondary | ICD-10-CM | POA: Diagnosis not present

## 2023-07-20 DIAGNOSIS — N183 Chronic kidney disease, stage 3 unspecified: Secondary | ICD-10-CM | POA: Diagnosis present

## 2023-07-20 DIAGNOSIS — Z933 Colostomy status: Secondary | ICD-10-CM

## 2023-07-20 DIAGNOSIS — J849 Interstitial pulmonary disease, unspecified: Secondary | ICD-10-CM

## 2023-07-20 DIAGNOSIS — K529 Noninfective gastroenteritis and colitis, unspecified: Secondary | ICD-10-CM | POA: Diagnosis not present

## 2023-07-20 DIAGNOSIS — K56699 Other intestinal obstruction unspecified as to partial versus complete obstruction: Secondary | ICD-10-CM | POA: Diagnosis present

## 2023-07-20 DIAGNOSIS — Z741 Need for assistance with personal care: Secondary | ICD-10-CM | POA: Diagnosis not present

## 2023-07-20 DIAGNOSIS — W19XXXA Unspecified fall, initial encounter: Secondary | ICD-10-CM | POA: Diagnosis present

## 2023-07-20 DIAGNOSIS — R6521 Severe sepsis with septic shock: Secondary | ICD-10-CM

## 2023-07-20 DIAGNOSIS — M0510 Rheumatoid lung disease with rheumatoid arthritis of unspecified site: Secondary | ICD-10-CM | POA: Diagnosis present

## 2023-07-20 DIAGNOSIS — I471 Supraventricular tachycardia, unspecified: Secondary | ICD-10-CM | POA: Diagnosis present

## 2023-07-20 DIAGNOSIS — K921 Melena: Secondary | ICD-10-CM

## 2023-07-20 DIAGNOSIS — R5383 Other fatigue: Secondary | ICD-10-CM | POA: Diagnosis present

## 2023-07-20 DIAGNOSIS — M6282 Rhabdomyolysis: Secondary | ICD-10-CM | POA: Diagnosis not present

## 2023-07-20 DIAGNOSIS — E876 Hypokalemia: Secondary | ICD-10-CM | POA: Diagnosis not present

## 2023-07-20 DIAGNOSIS — Z6835 Body mass index (BMI) 35.0-35.9, adult: Secondary | ICD-10-CM | POA: Diagnosis not present

## 2023-07-20 DIAGNOSIS — D696 Thrombocytopenia, unspecified: Secondary | ICD-10-CM | POA: Diagnosis not present

## 2023-07-20 DIAGNOSIS — E1142 Type 2 diabetes mellitus with diabetic polyneuropathy: Secondary | ICD-10-CM

## 2023-07-20 DIAGNOSIS — I10 Essential (primary) hypertension: Secondary | ICD-10-CM | POA: Diagnosis not present

## 2023-07-20 DIAGNOSIS — J9611 Chronic respiratory failure with hypoxia: Secondary | ICD-10-CM | POA: Diagnosis not present

## 2023-07-20 DIAGNOSIS — Z6838 Body mass index (BMI) 38.0-38.9, adult: Secondary | ICD-10-CM

## 2023-07-20 DIAGNOSIS — I130 Hypertensive heart and chronic kidney disease with heart failure and stage 1 through stage 4 chronic kidney disease, or unspecified chronic kidney disease: Secondary | ICD-10-CM | POA: Diagnosis present

## 2023-07-20 DIAGNOSIS — F32A Depression, unspecified: Secondary | ICD-10-CM | POA: Diagnosis present

## 2023-07-20 DIAGNOSIS — Z9981 Dependence on supplemental oxygen: Secondary | ICD-10-CM | POA: Diagnosis not present

## 2023-07-20 DIAGNOSIS — L309 Dermatitis, unspecified: Secondary | ICD-10-CM | POA: Diagnosis present

## 2023-07-20 DIAGNOSIS — E612 Magnesium deficiency: Secondary | ICD-10-CM | POA: Diagnosis present

## 2023-07-20 DIAGNOSIS — A419 Sepsis, unspecified organism: Secondary | ICD-10-CM | POA: Diagnosis present

## 2023-07-20 DIAGNOSIS — I5032 Chronic diastolic (congestive) heart failure: Secondary | ICD-10-CM | POA: Diagnosis present

## 2023-07-20 DIAGNOSIS — R2689 Other abnormalities of gait and mobility: Secondary | ICD-10-CM | POA: Diagnosis not present

## 2023-07-20 DIAGNOSIS — R000 Tachycardia, unspecified: Secondary | ICD-10-CM | POA: Diagnosis not present

## 2023-07-20 DIAGNOSIS — E785 Hyperlipidemia, unspecified: Secondary | ICD-10-CM | POA: Diagnosis not present

## 2023-07-20 DIAGNOSIS — Z79899 Other long term (current) drug therapy: Secondary | ICD-10-CM

## 2023-07-20 DIAGNOSIS — I272 Pulmonary hypertension, unspecified: Secondary | ICD-10-CM | POA: Diagnosis not present

## 2023-07-20 DIAGNOSIS — Z86011 Personal history of benign neoplasm of the brain: Secondary | ICD-10-CM

## 2023-07-20 DIAGNOSIS — Z803 Family history of malignant neoplasm of breast: Secondary | ICD-10-CM

## 2023-07-20 DIAGNOSIS — R600 Localized edema: Secondary | ICD-10-CM | POA: Diagnosis present

## 2023-07-20 DIAGNOSIS — K649 Unspecified hemorrhoids: Secondary | ICD-10-CM | POA: Diagnosis present

## 2023-07-20 DIAGNOSIS — Z800 Family history of malignant neoplasm of digestive organs: Secondary | ICD-10-CM

## 2023-07-20 DIAGNOSIS — D509 Iron deficiency anemia, unspecified: Secondary | ICD-10-CM | POA: Diagnosis present

## 2023-07-20 DIAGNOSIS — M6259 Muscle wasting and atrophy, not elsewhere classified, multiple sites: Secondary | ICD-10-CM | POA: Diagnosis not present

## 2023-07-20 DIAGNOSIS — F0394 Unspecified dementia, unspecified severity, with anxiety: Secondary | ICD-10-CM | POA: Diagnosis present

## 2023-07-20 DIAGNOSIS — Z7952 Long term (current) use of systemic steroids: Secondary | ICD-10-CM

## 2023-07-20 DIAGNOSIS — I493 Ventricular premature depolarization: Secondary | ICD-10-CM | POA: Diagnosis present

## 2023-07-20 DIAGNOSIS — Z8051 Family history of malignant neoplasm of kidney: Secondary | ICD-10-CM

## 2023-07-20 DIAGNOSIS — Z9181 History of falling: Secondary | ICD-10-CM

## 2023-07-20 DIAGNOSIS — E46 Unspecified protein-calorie malnutrition: Secondary | ICD-10-CM | POA: Diagnosis not present

## 2023-07-20 DIAGNOSIS — G3184 Mild cognitive impairment, so stated: Secondary | ICD-10-CM | POA: Diagnosis not present

## 2023-07-20 DIAGNOSIS — Z90710 Acquired absence of both cervix and uterus: Secondary | ICD-10-CM

## 2023-07-20 DIAGNOSIS — F411 Generalized anxiety disorder: Secondary | ICD-10-CM

## 2023-07-20 DIAGNOSIS — G629 Polyneuropathy, unspecified: Secondary | ICD-10-CM | POA: Diagnosis not present

## 2023-07-20 DIAGNOSIS — R1319 Other dysphagia: Secondary | ICD-10-CM | POA: Diagnosis not present

## 2023-07-20 DIAGNOSIS — Z743 Need for continuous supervision: Secondary | ICD-10-CM | POA: Diagnosis not present

## 2023-07-20 DIAGNOSIS — Z86718 Personal history of other venous thrombosis and embolism: Secondary | ICD-10-CM

## 2023-07-20 DIAGNOSIS — F0393 Unspecified dementia, unspecified severity, with mood disturbance: Secondary | ICD-10-CM | POA: Diagnosis present

## 2023-07-20 DIAGNOSIS — Z79890 Hormone replacement therapy (postmenopausal): Secondary | ICD-10-CM

## 2023-07-20 DIAGNOSIS — Z801 Family history of malignant neoplasm of trachea, bronchus and lung: Secondary | ICD-10-CM

## 2023-07-20 DIAGNOSIS — J8410 Pulmonary fibrosis, unspecified: Secondary | ICD-10-CM | POA: Diagnosis not present

## 2023-07-20 DIAGNOSIS — F419 Anxiety disorder, unspecified: Secondary | ICD-10-CM | POA: Diagnosis not present

## 2023-07-20 DIAGNOSIS — G894 Chronic pain syndrome: Secondary | ICD-10-CM | POA: Diagnosis present

## 2023-07-20 DIAGNOSIS — Z888 Allergy status to other drugs, medicaments and biological substances status: Secondary | ICD-10-CM

## 2023-07-20 DIAGNOSIS — K219 Gastro-esophageal reflux disease without esophagitis: Secondary | ICD-10-CM

## 2023-07-20 DIAGNOSIS — R5381 Other malaise: Principal | ICD-10-CM | POA: Diagnosis present

## 2023-07-20 DIAGNOSIS — I2510 Atherosclerotic heart disease of native coronary artery without angina pectoris: Secondary | ICD-10-CM | POA: Diagnosis present

## 2023-07-20 DIAGNOSIS — Z841 Family history of disorders of kidney and ureter: Secondary | ICD-10-CM

## 2023-07-20 DIAGNOSIS — Z833 Family history of diabetes mellitus: Secondary | ICD-10-CM

## 2023-07-20 LAB — BASIC METABOLIC PANEL
Anion gap: 6 (ref 5–15)
BUN: 8 mg/dL (ref 8–23)
CO2: 26 mmol/L (ref 22–32)
Calcium: 7.8 mg/dL — ABNORMAL LOW (ref 8.9–10.3)
Chloride: 109 mmol/L (ref 98–111)
Creatinine, Ser: 0.66 mg/dL (ref 0.44–1.00)
GFR, Estimated: >60 mL/min (ref >60)
Glucose, Bld: 109 mg/dL — ABNORMAL HIGH (ref 70–99)
Potassium: 4.3 mmol/L (ref 3.5–5.1)
Sodium: 141 mmol/L (ref 135–145)

## 2023-07-20 LAB — CBC
HCT: 34.9 % — ABNORMAL LOW (ref 36.0–46.0)
Hemoglobin: 10.7 g/dL — ABNORMAL LOW (ref 12.0–15.0)
MCH: 29.8 pg (ref 26.0–34.0)
MCHC: 30.7 g/dL (ref 30.0–36.0)
MCV: 97.2 fL (ref 80.0–100.0)
Platelets: 170 10*3/uL (ref 150–400)
RBC: 3.59 MIL/uL — ABNORMAL LOW (ref 3.87–5.11)
RDW: 15.9 % — ABNORMAL HIGH (ref 11.5–15.5)
WBC: 7.6 10*3/uL (ref 4.0–10.5)
nRBC: 0 % (ref 0.0–0.2)

## 2023-07-20 LAB — CULTURE, BLOOD (ROUTINE X 2)
Culture: NO GROWTH
Culture: NO GROWTH

## 2023-07-20 LAB — GLUCOSE, CAPILLARY
Glucose-Capillary: 80 mg/dL (ref 70–99)
Glucose-Capillary: 99 mg/dL (ref 70–99)
Glucose-Capillary: 99 mg/dL (ref 70–99)

## 2023-07-20 LAB — MAGNESIUM: Magnesium: 1.8 mg/dL (ref 1.7–2.4)

## 2023-07-20 LAB — CREATININE, SERUM
Creatinine, Ser: 0.61 mg/dL (ref 0.44–1.00)
GFR, Estimated: >60 mL/min (ref >60)

## 2023-07-20 MED ORDER — AZITHROMYCIN 250 MG PO TABS
250.0000 mg | ORAL_TABLET | ORAL | Status: DC
  Administered 2023-07-20 – 2023-08-03 (×7): 250 mg via ORAL
  Filled 2023-07-20 (×7): qty 1

## 2023-07-20 MED ORDER — HYDROXYCHLOROQUINE SULFATE 200 MG PO TABS: 200.0000 mg | ORAL_TABLET | Freq: Every day | ORAL | Status: DC

## 2023-07-20 MED ORDER — OXYCODONE HCL 5 MG PO TABS: 5.0000 mg | ORAL_TABLET | Freq: Four times a day (QID) | ORAL | Status: DC | PRN

## 2023-07-20 MED ORDER — ENOXAPARIN SODIUM 40 MG/0.4ML IJ SOSY: 40.0000 mg | PREFILLED_SYRINGE | INTRAMUSCULAR | Status: DC

## 2023-07-20 MED ORDER — INSULIN ASPART 100 UNIT/ML IJ SOLN: 0.0000 [IU] | Freq: Three times a day (TID) | INTRAMUSCULAR | Status: DC

## 2023-07-20 MED ORDER — FUROSEMIDE 20 MG PO TABS: 20.0000 mg | ORAL_TABLET | Freq: Every day | ORAL | Status: DC

## 2023-07-20 MED ORDER — PANTOPRAZOLE SODIUM 40 MG PO TBEC
40.0000 mg | DELAYED_RELEASE_TABLET | Freq: Two times a day (BID) | ORAL | Status: DC
  Administered 2023-07-20 – 2023-08-04 (×30): 40 mg via ORAL
  Filled 2023-07-20 (×30): qty 1

## 2023-07-20 MED ORDER — POTASSIUM CHLORIDE 20 MEQ PO PACK
40.0000 meq | PACK | Freq: Once | ORAL | Status: AC
  Administered 2023-07-20: 40 meq via ORAL
  Filled 2023-07-20: qty 2

## 2023-07-20 MED ORDER — DICLOFENAC SODIUM 1 % EX GEL
2.0000 g | Freq: Three times a day (TID) | CUTANEOUS | Status: DC
  Administered 2023-07-20 – 2023-08-04 (×43): 2 g via TOPICAL
  Filled 2023-07-20: qty 100

## 2023-07-20 MED ORDER — ATORVASTATIN CALCIUM 10 MG PO TABS
10.0000 mg | ORAL_TABLET | Freq: Every day | ORAL | Status: DC
  Administered 2023-07-21 – 2023-08-04 (×15): 10 mg via ORAL
  Filled 2023-07-20 (×15): qty 1

## 2023-07-20 MED ORDER — LEVOTHYROXINE SODIUM 50 MCG PO TABS
50.0000 ug | ORAL_TABLET | Freq: Every day | ORAL | Status: DC
  Administered 2023-07-21 – 2023-08-04 (×15): 50 ug via ORAL
  Filled 2023-07-20 (×15): qty 1

## 2023-07-20 MED ORDER — METOPROLOL SUCCINATE ER 50 MG PO TB24
50.0000 mg | ORAL_TABLET | Freq: Every day | ORAL | Status: DC
  Administered 2023-07-21 – 2023-07-22 (×2): 50 mg via ORAL
  Filled 2023-07-20 (×2): qty 1

## 2023-07-20 MED ORDER — LIDOCAINE 5 % EX PTCH
1.0000 | MEDICATED_PATCH | CUTANEOUS | Status: DC
  Administered 2023-07-21 – 2023-08-04 (×15): 1 via TRANSDERMAL
  Filled 2023-07-20 (×15): qty 1

## 2023-07-20 MED ORDER — ALBUTEROL SULFATE (2.5 MG/3ML) 0.083% IN NEBU
3.0000 mL | INHALATION_SOLUTION | RESPIRATORY_TRACT | Status: DC | PRN
Start: 2023-07-20 — End: 2023-08-04

## 2023-07-20 MED ORDER — MAGNESIUM SULFATE 2 GM/50ML IV SOLN
2.0000 g | Freq: Once | INTRAVENOUS | Status: AC
  Administered 2023-07-20: 2 g via INTRAVENOUS
  Filled 2023-07-20: qty 50

## 2023-07-20 MED ORDER — PREDNISONE 5 MG PO TABS
10.0000 mg | ORAL_TABLET | Freq: Every day | ORAL | Status: DC
  Administered 2023-07-21 – 2023-08-04 (×15): 10 mg via ORAL
  Filled 2023-07-20 (×15): qty 2

## 2023-07-20 MED ORDER — DULOXETINE HCL 60 MG PO CPEP
60.0000 mg | ORAL_CAPSULE | Freq: Every day | ORAL | Status: DC
  Administered 2023-07-21 – 2023-08-04 (×15): 60 mg via ORAL
  Filled 2023-07-20 (×15): qty 1

## 2023-07-20 MED ORDER — MORPHINE SULFATE ER 15 MG PO TBCR
15.0000 mg | EXTENDED_RELEASE_TABLET | Freq: Two times a day (BID) | ORAL | Status: DC
  Administered 2023-07-20 – 2023-08-04 (×30): 15 mg via ORAL
  Filled 2023-07-20 (×30): qty 1

## 2023-07-20 MED ORDER — GABAPENTIN 300 MG PO CAPS
300.0000 mg | ORAL_CAPSULE | Freq: Two times a day (BID) | ORAL | Status: DC
  Administered 2023-07-20 – 2023-08-04 (×30): 300 mg via ORAL
  Filled 2023-07-20 (×30): qty 1

## 2023-07-20 MED ORDER — ENOXAPARIN SODIUM 40 MG/0.4ML IJ SOSY
40.0000 mg | PREFILLED_SYRINGE | INTRAMUSCULAR | Status: DC
Start: 2023-07-21 — End: 2023-08-04
  Administered 2023-07-21 – 2023-08-04 (×15): 40 mg via SUBCUTANEOUS
  Filled 2023-07-20 (×15): qty 0.4

## 2023-07-20 MED ORDER — METHOCARBAMOL 500 MG PO TABS
500.0000 mg | ORAL_TABLET | Freq: Four times a day (QID) | ORAL | Status: DC | PRN
  Administered 2023-07-21 – 2023-07-29 (×6): 500 mg via ORAL
  Filled 2023-07-20 (×8): qty 1

## 2023-07-20 MED ORDER — LEFLUNOMIDE 20 MG PO TABS: 20.0000 mg | ORAL_TABLET | Freq: Every day | ORAL | Status: DC

## 2023-07-20 MED ORDER — BOOST / RESOURCE BREEZE PO LIQD CUSTOM: 1.0000 | Freq: Three times a day (TID) | ORAL | Status: DC

## 2023-07-20 MED ORDER — ACETAMINOPHEN 500 MG PO TABS
1000.0000 mg | ORAL_TABLET | Freq: Four times a day (QID) | ORAL | Status: DC | PRN
  Administered 2023-07-21 – 2023-07-28 (×6): 1000 mg via ORAL
  Filled 2023-07-20 (×7): qty 2

## 2023-07-20 MED ORDER — ACETAMINOPHEN 650 MG RE SUPP: 650.0000 mg | Freq: Four times a day (QID) | RECTAL | Status: DC | PRN

## 2023-07-20 MED ORDER — GUAIFENESIN ER 600 MG PO TB12
1200.0000 mg | ORAL_TABLET | Freq: Two times a day (BID) | ORAL | Status: DC
  Administered 2023-07-20 – 2023-08-04 (×30): 1200 mg via ORAL
  Filled 2023-07-20 (×30): qty 2

## 2023-07-20 NOTE — Consult Note (Signed)
 WOC team received consult for new diverting loop colostomy placed by Dr. Corliss Skains 07/17/2023.  WOC team will follow for ostomy education and support.   Thank you,    Priscella Mann MSN, RN-BC, [employer] [phone]

## 2023-07-20 NOTE — TOC Progression Note (Signed)
 Transition of Care [facility]) - Progression Note    Patient Details  Name: [name] MRN: [id number] Date of Birth: 1940-10-17  Transition of [facility]) CM/SW Contact  Mearl Latin, LCSW Phone Number: 07/20/2023, 8:48 AM  Clinical Narrative:    CSW continuing to follow for CIR determination.    Expected Discharge Plan: IP Rehab Facility Barriers to Discharge: Continued Medical Work up  Expected Discharge Plan and Services In-house Referral: Clinical Social Work   Post Acute Care Choice: Skilled Nursing Facility Living arrangements for the past 2 months: Single Family Home                                       Social Determinants of Health (SDOH) Interventions SDOH Screenings   Food Insecurity: No Food Insecurity (07/16/2023)  Housing: Low Risk  (07/16/2023)  Transportation Needs: No Transportation Needs (07/16/2023)  Utilities: Not At Risk (07/16/2023)  Alcohol Screen: Low Risk  (10/27/2022)  Depression (PHQ2-9): High Risk (06/12/2023)  Financial Resource Strain: Low Risk  (06/11/2023)  Physical Activity: Inactive (06/11/2023)  Social Connections: Socially Integrated (07/16/2023)  Stress: No Stress Concern Present (10/27/2022)  Tobacco Use: Low Risk  (07/17/2023)    Readmission Risk Interventions    07/17/2023    3:28 PM 07/08/2023   10:09 AM 12/16/2022   11:09 AM  Readmission Risk Prevention Plan  Transportation Screening Complete Complete Complete  PCP or Specialist Appt within 5-7 Days   Complete  Home Care Screening   Complete  Medication Review (RN CM)   Complete  Medication Review (RN Care Manager) Complete Referral to Pharmacy   PCP or Specialist appointment within 3-5 days of discharge Complete Complete   HRI or Home Care Consult Complete Complete   SW Recovery Care/Counseling Consult Complete    Palliative Care Screening Not Applicable Not Applicable   Skilled Nursing Facility Complete Complete

## 2023-07-20 NOTE — Progress Notes (Addendum)
 Inpatient Rehab Admissions Coordinator:    I have a CIR bed for this Pt. Today. RN may call report to [phone]  Pt.to admit to CIR for an estimated 16-18 days with the goal of reaching supervision level and returning home with assist from her children.   Megan Salon, MS, CCC-SLP Rehab Admissions Coordinator  [phone] (celll) [phone] (office)

## 2023-07-20 NOTE — Progress Notes (Signed)
 Attempt x1 to call report - was left on hold for an extended period of  time.

## 2023-07-20 NOTE — H&P (Addendum)
 Physical Medicine and Rehabilitation Admission H&P  Chief Complaint  Patient presents with   Weakness   : HPI: [name] is a 83 year old right-handed female with history significant for chronic sigmoid stricture related to diverticular disease, hypertension, hyperlipidemia, diabetes mellitus with peripheral neuropathy, hypothyroidism, CKD stage III, gout, ILD, chronic respiratory failure/COPD on home oxygen with 3 L at baseline, chronic diastolic congestive heart failure, rheumatoid arthritis with chronic steroids, history of GI bleed, DVT/PE, chronic pain maintained on MS Contin 15 mg every 12 hours as well as oxycodone as needed, iron deficiency anemia, obesity with BMI 35.42, 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  Per chart review patient lives with spouse and son.  She has an aide Monday through Friday 9-1.  Two-level home bed and bath main level with one-step to entry.  Modified independent for ADLs and rollator for mobility.  Patient with recent admission for sepsis 06/30/2023 - 07/10/2023 as well as viral respiratory illness and discharged for short time to skilled nursing facility as well as recent admission 06/13/2023 - 06/26/2023 for septic shock secondary to flu A/RSV pneumonia/UTI.  Presented 07/15/2023 with abdominal pain and diarrhea with black stools as well as hypotension.  She was given IV fluids with improvement in blood pressure.  Admission chemistries unremarkable except WBC 11,900, hemoglobin 11.8, potassium 3.4, CO2 21, creatinine 1.09, lipase of 20, lactic acid 3.1, blood cultures no growth to date, urinalysis negative nitrite.  Guaiac positive and noted low-grade fever.  Code sepsis was initiated.  Cranial CT scan negative.  CT of the abdomen pelvis showed dilatation of colon-abrupt change in caliber noted involving the sigmoid colon-likely stricture.  General surgery consulted for large bowel obstruction secondary to  distal sigmoid stenosis undergoing laparoscopic assisted diverting loop colostomy 07/17/2023 per Dr.Tsuei.  Initially maintained on broad-spectrum antibiotics.  Blood pressure remained monitored initially managed with stress dose IV hydrocortisone/midodrine with significant improvement and midodrine has been discontinued transition hydrocortisone to prednisone.  Patient's hemoglobin and hematocrit remained stable.  Rheumatoid arthritis currently with Plaquenil/Leflunomide on hold.  She reports chronic joint pain particularly in her shoulders, hands and feet.  She did not sleep well yesterday due to headache.  She says prednisone sometimes contributes to her headaches.  Patient's prednisone has been resumed.  Headache has resolved today.  She did have a run of SVT and Toprol has been adjusted.  Subcutaneous Lovenox for DVT prophylaxis.  She is currently on a soft  diet advanced as tolerated.  Therapy evaluations completed due to patient's decreased functional mobility was admitted for a comprehensive rehab program  Review of Systems  Constitutional:  Positive for fever. Negative for chills.  HENT:  Negative for hearing loss.   Eyes:  Negative for blurred vision and double vision.  Respiratory:  Positive for shortness of breath. Negative for wheezing.   Cardiovascular:  Positive for chest pain (Intermittant, not currently having this), palpitations and leg swelling.  Gastrointestinal:  Positive for blood in stool, diarrhea, nausea and vomiting.  Genitourinary:  Positive for urgency. Negative for dysuria, flank pain and hematuria.  Musculoskeletal:  Positive for back pain, joint pain and myalgias.  Skin:  Negative for rash.  Neurological:  Positive for weakness and headaches. Negative for sensory change and speech change.  Psychiatric/Behavioral:         Anxiety  All other systems reviewed and are negative.  Past Medical History:  Diagnosis Date   Acute encephalopathy 03/30/2021   11/12 -  04/03/2021  acute encephalopathy in the context of altered mental status and poor oral intake following increasing pain medicines and associated Klebsiella UTI.     Acute kidney injury superimposed on chronic kidney disease (HCC) 04/17/2015   11/12 - 04/03/2021 AKI with creatinine of 2.22 and GFR 22 indicating stage IV renal disease in the context of encephalopathy secondary to increased pain medications with decreased oral intake of fluids and nutrition.     Acute on chronic diastolic (congestive) heart failure (HCC)    Anemia    iron deficiency anemia - secondary to blood loss ( chronic)    Anxiety    Arthritis    endstage changes bilateral knees/bilateral ankles.    Asthma    Carotid artery occlusion    Chest pain of uncertain etiology 01/28/2012   Normal coronaries at cath     Chronic fatigue    Chronic kidney disease    Closed intertrochanteric fracture of hip, left, initial encounter (HCC) 11/29/2016   Closed left hip fracture (HCC)    Closed nondisplaced intertrochanteric fracture of left femur with delayed healing 01/14/2017   Clotting disorder (HCC)    pt denies this   Contusion of left knee    due to fall 1/14.   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     Morbid obesity (HCC)    Neuromuscular disorder (HCC)    diabetic neuropathy    Normal coronary arteries    cardiac catheterization performed  10/31/14   Pneumonia    RA (rheumatoid arthritis) (HCC)    has been off methotreaxte  since 10/13.   Septic shock (HCC) 06/13/2023   Spinal stenosis of lumbar region    Thyroid disease    Unspecified lack of coordination    URI (upper respiratory infection)    UTI (urinary tract infection) 03/31/2021   11/12 - 04/03/2021 Klebsiella UTI associated with encephalopathy in the context of AKI.  Status post IV ceftriaxone followed by oral Keflex x48 hours.     Past Surgical History:  Procedure Laterality Date   BRAIN SURGERY     Gamma knife 10/13. Needs repeat spring  '14   CARDIAC CATHETERIZATION N/A 10/31/2014   Procedure: Right/Left Heart Cath and Coronary Angiography;  Surgeon: Corky Crafts, MD;  Location[facility] INVASIVE CV LAB;  Service: Cardiovascular;  Laterality: N/A;   COLONOSCOPY WITH PROPOFOL N/A 04/14/2022   Procedure: COLONOSCOPY WITH PROPOFOL;  Surgeon: Beverley Fiedler, MD;  Location: [facility] ENDOSCOPY;  Service: Gastroenterology;  Laterality: N/A;   CYST EXCISION     2 on face   CYST REMOVAL NECK     ESOPHAGOGASTRODUODENOSCOPY (EGD) WITH PROPOFOL N/A 09/14/2014   Procedure: ESOPHAGOGASTRODUODENOSCOPY (EGD) WITH PROPOFOL;  Surgeon: Louis Meckel, MD;  Location: [facility] ENDOSCOPY;  Service: Endoscopy;  Laterality: N/A;   INCISION AND DRAINAGE HIP Left 01/16/2017   Procedure: IRRIGATION AND DEBRIDEMENT LEFT HIP;  Surgeon: Kathryne Hitch, MD;  Location: [facility] ORS;  Service: Orthopedics;  Laterality: Left;   INTRAMEDULLARY (IM) NAIL INTERTROCHANTERIC Left 11/29/2016   Procedure: INTRAMEDULLARY (IM) NAIL INTERTROCHANTRIC;  Surgeon: Kathryne Hitch, MD;  Location: [facility] OR;  Service: Orthopedics;  Laterality: Left;   LAPAROSCOPIC LOOP COLOSTOMY N/A 07/17/2023   Procedure: LAPAROSCOPIC DIVERTING LOOP COLOSTOMY;  Surgeon: Manus Rudd, MD;  Location: [facility] OR;  Service: General;  Laterality: N/A;   OVARY SURGERY     RIGHT HEART CATH N/A 08/12/2021   Procedure: RIGHT HEART CATH;  Surgeon: Runell Gess, MD;  Location: [facility] INVASIVE CV LAB;  Service: Cardiovascular;   Laterality: N/A;   SHOULDER SURGERY Left    TONSILLECTOMY  age 89   VAGINAL HYSTERECTOMY     VIDEO BRONCHOSCOPY Bilateral 05/31/2013   Procedure: VIDEO BRONCHOSCOPY WITHOUT FLUORO;  Surgeon: Kalman Shan, MD;  Location: [facility] ENDOSCOPY;  Service: Cardiopulmonary;  Laterality: Bilateral;   video bronscoscopy  05/19/1998   lung   Family History  Problem Relation Age of Onset   Diabetes Mother    Heart attack Mother    Hypertension Father    Lung cancer Father    Diabetes Sister    Breast cancer Sister    Breast cancer Sister    Diabetes Brother    Hypertension Brother    Heart disease Brother    Heart attack Brother    Kidney cancer Brother    Gout Brother    Kidney failure Brother        x 5   Esophageal cancer Brother    Stomach cancer Brother    Prostate cancer Brother    Uterine cancer Daughter        with mets   Fibroids Daughter    Hypertension Daughter    Hypertension Daughter    Hypertension Son    Heart murmur Son    Rheum arthritis Maternal Uncle    Alzheimer's disease Neg Hx    Dementia Neg Hx    Social History:  reports that she has never smoked. She has never used smokeless tobacco. She reports that she does not drink alcohol and does not use drugs. Allergies:  Allergies  Allergen Reactions   Codeine Swelling and Other (See Comments)    Facial swelling, Chest pain, and swelling in legs    Infliximab Anaphylaxis    (REMICADE) "sent me into shock"    Lisinopril Swelling and Rash    Face and neck swelling    Ofev [Nintedanib] Diarrhea   Medications Prior to Admission  Medication Sig Dispense Refill   albuterol (VENTOLIN HFA) 108 (90 Base) MCG/ACT inhaler Inhale 2 puffs into the lungs as needed for wheezing or shortness of breath.     Artificial Tear Ointment (DRY EYES OP) Place 2 drops into both eyes as needed (dry eyes).     atorvastatin (LIPITOR) 10 MG tablet Take 1 tablet (10 mg total) by mouth daily. 90 tablet 3   azithromycin (ZITHROMAX) 250  MG tablet Take 1 tablet (250 mg total) by mouth every Monday, Wednesday, and Friday. Take every Monday, Wednesday, Friday. 12 tablet 11   Calcium Carb-Cholecalciferol (CALCIUM + D3 PO) Take 2 tablets by mouth daily.     Camphor-Menthol-Methyl Sal (SALONPAS EX) Apply 1 patch topically as needed (shoulder and leg pain).     Cholecalciferol (VITAMIN D3) 50 MCG (2000 UT) capsule Take 2,000 Units by mouth daily.     diclofenac Sodium (VOLTAREN) 1 % GEL Apply 2 g topically 3 (  three) times daily. Apply to both hands and knees (Patient taking differently: Apply 2 g topically in the morning, at noon, and at bedtime. Apply to both hands and knees.) 100 g 1   dicyclomine (BENTYL) 20 MG tablet Take 1 tablet (20 mg total) by mouth 4 (four) times daily -  before meals and at bedtime.     diphenoxylate-atropine (LOMOTIL) 2.5-0.025 MG tablet Take 1 tablet by mouth 4 (four) times daily as needed for diarrhea or loose stools.     DULoxetine (CYMBALTA) 60 MG capsule Take 1 capsule (60 mg total) by mouth daily. 90 capsule 2   [START ON 07/21/2023] enoxaparin (LOVENOX) 40 MG/0.4ML injection Inject 0.4 mLs (40 mg total) into the skin daily.     ferrous sulfate 324 MG TBEC Take 324 mg by mouth every evening.     fluticasone (FLONASE) 50 MCG/ACT nasal spray Place 1 spray into both nostrils daily. 16 g 3   furosemide (LASIX) 20 MG tablet Take 1 tablet (20 mg total) by mouth daily.     gabapentin (NEURONTIN) 600 MG tablet Take 0.5 tablets (300 mg total) by mouth 2 (two) times daily.     guaiFENesin (MUCINEX) 600 MG 12 hr tablet Take 2 tablets (1,200 mg total) by mouth 2 (two) times daily. 120 tablet 0   [START ON 07/22/2023] hydroxychloroquine (PLAQUENIL) 200 MG tablet Take 1 tablet (200 mg total) by mouth daily.     [START ON 07/22/2023] leflunomide (ARAVA) 20 MG tablet Take 1 tablet (20 mg total) by mouth daily.     levothyroxine (SYNTHROID) 50 MCG tablet Take 1 tablet (50 mcg total) by mouth daily. 90 tablet 3   lidocaine  (LIDODERM) 5 % Place 1 patch onto the skin daily. Remove & Discard patch within 12 hours or as directed by MD 60 patch 11   linaclotide (LINZESS) 72 MCG capsule Take 72 mcg by mouth daily before breakfast.     metolazone (ZAROXOLYN) 2.5 MG tablet TAKE 1 TABLET BY MOUTH DAILY AS NEEDED FOR 2 DAYS FOR WEIGHT ABOVE 255 POUNDS 30 tablet 0   metoprolol succinate (TOPROL-XL) 50 MG 24 hr tablet Take 1 tablet (50 mg total) by mouth daily. Take with or immediately following a meal.     morphine (MS CONTIN) 15 MG 12 hr tablet Take 1 tablet (15 mg total) by mouth every 12 (twelve) hours. SNF use only 10 tablet 0   Multiple Vitamin (MULTIVITAMIN WITH MINERALS) TABS Take 1 tablet by mouth daily.      ondansetron (ZOFRAN-ODT) 4 MG disintegrating tablet Take 1 tablet (4 mg total) by mouth every 8 (eight) hours as needed for nausea or vomiting. 30 tablet 3   oxyCODONE (OXY IR/ROXICODONE) 5 MG immediate release tablet Take 1 tablet (5 mg total) by mouth every 6 (six) hours as needed for moderate pain (pain score 4-6). SNF use only 10 tablet 0   OXYGEN Inhale 3 L into the lungs See admin instructions. 3 L at bedtime, and 3 L during the day as needed for exertion     pantoprazole (PROTONIX) 40 MG tablet Take 1 tablet (40 mg total) by mouth 2 (two) times daily. 120 tablet 4   polyethylene glycol (MIRALAX / GLYCOLAX) 17 g packet Take 17 g by mouth 2 (two) times daily. Hold for diarrhea     Potassium Chloride ER 20 MEQ TBCR Please take 20 mg p.o. twice daily for first 3 days, then go back to 20 mg oral daily, monitor frequently and adjust  as needed (Patient taking differently: Take 20 mEq by mouth daily. Please take 20 mg p.o. twice daily for first 3 days, then go back to 20 mg oral daily, monitor frequently and adjust as needed)     predniSONE (DELTASONE) 10 MG tablet Take 1 tablet (10 mg total) by mouth daily with breakfast. 90 tablet 1   simethicone (MYLICON) 80 MG chewable tablet Chew 1 tablet (80 mg total) by mouth  every 6 (six) hours as needed for flatulence.     zinc oxide 20 % ointment Apply 1 Application topically See admin instructions. Every shift. Apply to MASD to left buttocks and with each incontinent episode.        Home: Home Living Family/patient expects to be discharged to:: Inpatient rehab Living Arrangements: Spouse/significant other Available Help at Discharge: Family, Available 24 hours/day (aides m-f 9-1) Type of Home: House Home Access: Stairs to enter [employer] of Steps: 1 Entrance Stairs-Rails: None Home Layout: Two level, Able to live on main level with bedroom/bathroom Bathroom Shower/Tub: Health visitor: Handicapped height Bathroom Accessibility: Yes Home Equipment: Rollator (4 wheels), Wheelchair - manual, Agricultural consultant (2 wheels), Shower seat, BSC/3in1 Additional Comments: 24/7 assist available from husband and brother-in-law who lives with patient. aide M-F   Functional History: Prior Function Prior Level of Function : Independent/Modified Independent Mobility Comments: rollator in the house, wheelchair community distances ADLs Comments: MOD I for ADL, assist for IADLs prior to previous admission; now requires assist for all ADL/IADL   Functional Status:  Mobility: Bed Mobility Overal bed mobility: Needs Assistance Bed Mobility: Supine to Sit Rolling: Min assist Sidelying to sit: HOB elevated, Used rails, Min assist Supine to sit: Mod assist General bed mobility comments: Mod A to assist with bringing BLEs to EOB Transfers Overall transfer level: Needs assistance Equipment used: Rolling walker (2 wheels) Transfers: Sit to/from Stand, Bed to chair/wheelchair/BSC Sit to Stand: Min assist Bed to/from chair/wheelchair/BSC transfer type:: Stand pivot Stand pivot transfers: Mod assist General transfer comment: Mod A to pivot to recliner, poor balance control needs close pivots, pt wants to sit prematurely. Forward flexed  posture Ambulation/Gait Ambulation/Gait assistance: Min assist Gait Distance (Feet): 3 Feet Assistive device: Rolling walker (2 wheels) Gait Pattern/deviations: Step-to pattern General Gait Details: Cues for upright posture, able to march in place and progress with lateral steps along bed. HR did jump to 150s briefly but recovers to low 100s with seated rest. Lt knee a little unstable but no overt buckling and when pt able to extend arms on RW as instructed, stabilizes quite well. Gait velocity: decreased Pre-gait activities: Weight shift, static march, upright posture in RW with BIL UE support. at [facility] level   ADL: ADL Overall ADL's : Needs assistance/impaired Eating/Feeding: Independent, Bed level Grooming: Sitting, Wash/dry face, Set up Upper Body Bathing: Sitting, Minimal assistance Lower Body Bathing: Sitting/lateral leans, Maximal assistance Upper Body Dressing : Sitting, Minimal assistance Lower Body Dressing: Total assistance, Sitting/lateral leans Toilet Transfer: Rolling walker (2 wheels), Stand-pivot, Moderate assistance Toilet Transfer Details (indicate cue type and reason): sim with recliner Toileting- Clothing Manipulation and Hygiene: Sit to/from stand, Total assistance Functional mobility during ADLs: Moderate assistance, Rolling walker (2 wheels) (pivot to recliner')   Cognition: Cognition Overall Cognitive Status: Within Functional Limits for tasks assessed Orientation Level: Oriented X4 Cognition Arousal: Alert Behavior During Therapy: WFL for tasks assessed/performed Overall Cognitive Status: Within Functional Limits for tasks assessed General Comments: Appeared to be [facility] Difficult to assess due to: Impaired communication  Physical Exam: Blood pressure (!) 145/93, pulse 94, temperature 97.9 F (36.6 C), temperature source Oral, resp. rate 15, height 5\' 3"  (1.6 m), weight 90.7 kg, SpO2 100%.   General: No apparent distress, sitting in bedside chair HEENT:  Head is normocephalic, atraumatic, sclera anicteric, oral mucosa pink and moist, + nasal cannula 2 L O2 Neck: Supple without JVD or lymphadenopathy Heart: Reg rate and rhythm. No murmurs rubs or gallops Chest: CTA bilaterally without wheezes, rales, or rhonchi; no distress Abdomen: Soft, non-tender, mildly-distended, bowel sounds positive.  Ostomy with brown liquid stool Abdominal incision CDI Extremities: + Lower extremity edema bilaterally Psych: Pt's affect is appropriate. Pt is cooperative Skin: Clean and intact without signs of breakdown Neuro:    Mental Status: AAOx3, memory grossly intact, fund of knowledge appropriate Speech/Languate: Follows commands CRANIAL NERVES: Cranial nerves II through XII grossly intact   MOTOR: RUE: 3/5 Deltoid, 4/5 Biceps, 4/5 Triceps,4/5 Grip LUE: 3/5 Deltoid, 4/5 Biceps, 4/5 Triceps, 4/5 Grip RLE: HF 4-/5, KE 4/5, ADF 4/5, APF 4/5 LLE: HF 4-/5, KE 4/5, ADF 4/5, APF 4/5  SENSORY: Normal to touch all 4 extremities  Coordination: Normal finger to nose, no tremor, no dysmetria  MSK: Bilateral shoulder pain with ROM-chronic    Results for orders placed or performed during the hospital encounter of 07/15/23 (from the past 48 hours)  Glucose, capillary     Status: Abnormal   Collection Time: 07/18/23  4:22 PM  Result Value Ref Range   Glucose-Capillary 127 (H) 70 - 99 mg/dL    Comment: Glucose reference range applies only to samples taken after fasting for at least 8 hours.  Glucose, capillary     Status: Abnormal   Collection Time: 07/18/23  9:48 PM  Result Value Ref Range   Glucose-Capillary 128 (H) 70 - 99 mg/dL    Comment: Glucose reference range applies only to samples taken after fasting for at least 8 hours.  CBC     Status: Abnormal   Collection Time: 07/19/23  5:31 AM  Result Value Ref Range   WBC 6.9 4.0 - 10.5 K/uL   RBC 3.28 (L) 3.87 - 5.11 MIL/uL   Hemoglobin 9.9 (L) 12.0 - 15.0 g/dL   HCT 16.1 (L) 09.6 - 04.5 %   MCV 95.1  80.0 - 100.0 fL   MCH 30.2 26.0 - 34.0 pg   MCHC 31.7 30.0 - 36.0 g/dL   RDW 40.9 (H) 81.1 - 91.4 %   Platelets 181 150 - 400 K/uL   nRBC 0.0 0.0 - 0.2 %    Comment: Performed at [facility], [address] [place], [place] [address]  Basic metabolic panel     Status: Abnormal   Collection Time: 07/19/23  5:31 AM  Result Value Ref Range   Sodium 140 135 - 145 mmol/L   Potassium 2.7 (LL) 3.5 - 5.1 mmol/L    Comment: CRITICAL RESULT CALLED TO, READ BACK BY AND VERIFIED WITH B. Cassells RN ,  @0717 , 07/19/23, Dabdee,T.   Chloride 106 98 - 111 mmol/L   CO2 23 22 - 32 mmol/L   Glucose, Bld 101 (H) 70 - 99 mg/dL    Comment: Glucose reference range applies only to samples taken after fasting for at least 8 hours.   BUN 8 8 - 23 mg/dL   Creatinine, Ser 6.21 0.44 - 1.00 mg/dL   Calcium 7.7 (L) 8.9 - 10.3 mg/dL   GFR, Estimated >30 >86 mL/min    Comment: (NOTE) Calculated  using the CKD-EPI Creatinine Equation (2021)    Anion gap 11 5 - 15    Comment: Performed at [facility], [address] [place], [place] [address]  Glucose, capillary     Status: None   Collection Time: 07/19/23  8:13 AM  Result Value Ref Range   Glucose-Capillary 91 70 - 99 mg/dL    Comment: Glucose reference range applies only to samples taken after fasting for at least 8 hours.  Glucose, capillary     Status: None   Collection Time: 07/19/23 11:27 AM  Result Value Ref Range   Glucose-Capillary 98 70 - 99 mg/dL    Comment: Glucose reference range applies only to samples taken after fasting for at least 8 hours.  Glucose, capillary     Status: Abnormal   Collection Time: 07/19/23  3:54 PM  Result Value Ref Range   Glucose-Capillary 117 (H) 70 - 99 mg/dL    Comment: Glucose reference range applies only to samples taken after fasting for at least 8 hours.  Glucose, capillary     Status: Abnormal   Collection Time: 07/19/23  9:45 PM  Result Value Ref Range   Glucose-Capillary 106 (H) 70 - 99 mg/dL     Comment: Glucose reference range applies only to samples taken after fasting for at least 8 hours.  Basic metabolic panel     Status: Abnormal   Collection Time: 07/20/23  4:44 AM  Result Value Ref Range   Sodium 141 135 - 145 mmol/L   Potassium 4.3 3.5 - 5.1 mmol/L   Chloride 109 98 - 111 mmol/L   CO2 26 22 - 32 mmol/L   Glucose, Bld 109 (H) 70 - 99 mg/dL    Comment: Glucose reference range applies only to samples taken after fasting for at least 8 hours.   BUN 8 8 - 23 mg/dL   Creatinine, Ser 6.04 0.44 - 1.00 mg/dL   Calcium 7.8 (L) 8.9 - 10.3 mg/dL   GFR, Estimated >54 >09 mL/min    Comment: (NOTE) Calculated using the CKD-EPI Creatinine Equation (2021)    Anion gap 6 5 - 15    Comment: Performed at [facility], [address], [place], [place] [address]  Magnesium     Status: None   Collection Time: 07/20/23  4:44 AM  Result Value Ref Range   Magnesium 1.8 1.7 - 2.4 mg/dL    Comment: Performed at [facility], [address], [place], [place] [address]  Glucose, capillary     Status: None   Collection Time: 07/20/23  8:35 AM  Result Value Ref Range   Glucose-Capillary 80 70 - 99 mg/dL    Comment: Glucose reference range applies only to samples taken after fasting for at least 8 hours.  Glucose, capillary     Status: None   Collection Time: 07/20/23 12:06 PM  Result Value Ref Range   Glucose-Capillary 99 70 - 99 mg/dL    Comment: Glucose reference range applies only to samples taken after fasting for at least 8 hours.   *Note: Due to a large number of results and/or encounters for the requested time period, some results have not been displayed. A complete set of results can be found in Results Review.   No results found.   Blood pressure (!) 145/93, pulse 94, temperature 97.9 F (36.6 C), temperature source Oral, resp. rate 15, height 5\' 3"  (1.6 m), weight 90.7 kg, SpO2 100%.   Medical Problem  List and Plan: 1. Functional deficits secondary to septic  shock/debility due to symptomatic chronic sigmoid colon stricture.  Status post diverting loop ostomy 2/28  -patient may shower if ostomy covered  -ELOS/Goals: 12-14, Min A to sup PT/OT  -Admit to CIR 2.  Antithrombotics: -DVT/anticoagulation:  Pharmaceutical: Lovenox  -antiplatelet therapy: N/A 3. Pain Management/chronic pain: Neurontin 300 mg twice daily, Voltaren gel 2 g 3 times daily, Lidoderm patch as directed, MS Contin 15 mg every 12 hours, oxycodone as needed, Robaxin as needed 4. Mood/Behavior/Sleep: Cymbalta 60 mg daily  -antipsychotic agents: N/A 5. Neuropsych/cognition: This patient is capable of making decisions on her own behalf. 6. Skin/Wound Care: Routine skin checks 7. Fluids/Electrolytes/Nutrition: Routine in and outs with follow-up chemistries  Hypokalemia- Recheck labs 8.  Hypotension.  Resolved.  Midodrine discontinued.  Toprol-XL 50 mg daily.  Home Benicar/hydralazine on hold resumed as needed.   9.  Rheumatoid arthritis.  Chronic prednisone resume.Leflunomide/Plaquenil currently on hold.  Can be resumed in the next couple of days 10.  Hyperlipidemia.  Lipitor 11.  Diabetes mellitus type 2 with peripheral neuropathy.  Hemoglobin A1c 5.6.  SSI.   -CBGs well controlled 12.  Hypothyroidism.  Synthroid 13.  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  Chronic oxygen.  3 L at baseline prior to admission.  Prophylactic Zithromax resumed 14.  Chronic diastolic congestive heart failure.  Monitor for any signs of fluid overload. Daily weights 15.  CKD stage III.  Follow-up chemistries. Last Cr 0.66 3/3 16.  Iron deficiency anemia.  Follow-up CBC 17.  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  Status post gamma knife 10/13.  Follow-up outpatient 18.  Obesity.  BMI 35.42.  Dietary follow-up 19. HLD. Continue stain   Charlton Amor, PA-C 07/20/2023  I have personally performed a face to face diagnostic evaluation of this patient and  formulated the key components of the plan.  Additionally, I have personally reviewed laboratory data, imaging studies, as well as relevant notes and concur with the physician assistant's documentation above.  The patient's status has not changed from the original H&P.  Any changes in documentation from the acute care chart have been noted above.  Fanny Dance, MD, [place]

## 2023-07-20 NOTE — Progress Notes (Signed)
 PMR Admission Coordinator Pre-Admission Assessment   Patient: [name] is an 83 y.o., female MRN: [id number] DOB: 1941/04/14 Height: 5\' 3"  (160 cm) Weight: 90.7 kg   Insurance Information HMO:     PPO:      PCP:      IPA:      80/20: yes     OTHER:  PRIMARY:  Medicare A and B      Policy#: [id number] Subscriber:  CM Name:       Phone#:      Fax#:  Pre-Cert#: verified Health and safety inspector:  Benefits:  Phone #:      Name:  Eff. Date: part a and  Part B 11/17/2006   Deduct: $1632      Out of Pocket Max: n/a      Life Max: n/a CIR: 100%      SNF: 20 full days Outpatient:      Co-Pay:  Home Health: 100%      Co-Pay:  DME:      Co-Pay:  Providers:  [employer]      Policy#: [id number]      Phone#:    Financial Counselor:       Phone#:    The "Data Collection Information Summary" for patients in Inpatient Rehabilitation Facilities with attached "Privacy Act Statement-Health Care Records" was provided and verbally reviewed with: Patient   Emergency Contact Information Contact Information       Name Relation Home Work Mobile    [place] Daughter     [phone]    Irven [place] [phone]   [phone]    Aline August Daughter [phone]   [phone]    [place], Patron Spouse [phone]   [phone]    Coral Spikes Daughter     [phone]         Other Contacts   None on File        Current Medical History  Patient Admitting Diagnosis: Colon Stricture History of Present Illness:[name] is a 83 year old right-handed female with history significant for chronic sigmoid stricture related to diverticular disease, hypertension, hyperlipidemia, diabetes mellitus with peripheral neuropathy, hypothyroidism, CKD stage III, gout, ILD, chronic respiratory failure/COPD on home oxygen with 3 L at baseline, chronic diastolic congestive heart failure, rheumatoid arthritis with chronic steroids, history of GI bleed, DVT/PE, chronic pain maintained on  MS Contin 15 mg every 12 hours as well as oxycodone as needed, iron deficiency anemia, obesity with BMI 35.42, 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  Per chart review patient lives with spouse and son.  She has an aide Monday through Friday 9-1.  Two-level home bed and bath main level with one-step to entry.  Modified independent for ADLs and rollator for mobility.  Patient with recent admission for sepsis 06/30/2023 - 07/10/2023 as well as viral respiratory illness and discharged for short time to skilled nursing facility as well as recent admission 06/13/2023 - 06/26/2023 for septic shock secondary to flu A/RSV pneumonia/UTI.  Presented 07/15/2023 with abdominal pain and diarrhea with black stools as well as hypotension.  She was given IV fluids with improvement in blood pressure.  Admission chemistries unremarkable except WBC 11,900, hemoglobin 11.8, potassium 3.4, CO2 21, creatinine 1.09, lipase of 20, lactic acid 3.1, blood cultures no growth to date, urinalysis negative nitrite.  Guaiac positive and noted low-grade fever.  Code sepsis was initiated.  Cranial CT scan negative.  CT of the abdomen pelvis showed dilatation of  colon-abrupt change in caliber noted involving the sigmoid colon-likely stricture.  General surgery consulted for large bowel obstruction secondary to distal sigmoid stenosis undergoing laparoscopic assisted diverting loop colostomy 07/17/2023 per Dr.Tsuei.  Initially maintained on broad-spectrum antibiotics.  Blood pressure remained monitored initially managed with stress dose IV hydrocortisone/midodrine with significant improvement and midodrine has been discontinued transition hydrocortisone to prednisone.  Patient's hemoglobin and hematocrit remained stable.  Rheumatoid arthritis currently with Plaquenil/Leflunomide on hold.  Patient's prednisone has been resumed.  She did have a run of SVT and Toprol has been adjusted.  Subcutaneous  Lovenox for DVT prophylaxis.  She is currently on a full liquid diet advanced as tolerated.  Therapy evaluations completed due to patient's decreased functional mobility was admitted for a comprehensive rehab program .         Patient's medical record from [facility]  has been reviewed by the rehabilitation admission coordinator and physician.   Past Medical History      Past Medical History:  Diagnosis Date   Acute encephalopathy 03/30/2021    11/12 - 04/03/2021 acute encephalopathy in the context of altered mental status and poor oral intake following increasing pain medicines and associated Klebsiella UTI.     Acute kidney injury superimposed on chronic kidney disease (HCC) 04/17/2015    11/12 - 04/03/2021 AKI with creatinine of 2.22 and GFR 22 indicating stage IV renal disease in the context of encephalopathy secondary to increased pain medications with decreased oral intake of fluids and nutrition.     Acute on chronic diastolic (congestive) heart failure (HCC)     Anemia      iron deficiency anemia - secondary to blood loss ( chronic)    Anxiety     Arthritis      endstage changes bilateral knees/bilateral ankles.    Asthma     Carotid artery occlusion     Chest pain of uncertain etiology 01/28/2012    Normal coronaries at cath     Chronic fatigue     Chronic kidney disease     Closed intertrochanteric fracture of hip, left, initial encounter (HCC) 11/29/2016   Closed left hip fracture (HCC)     Closed nondisplaced intertrochanteric fracture of left femur with delayed healing 01/14/2017   Clotting disorder (HCC)      pt denies this   Contusion of left knee      due to fall 1/14.   COPD (chronic obstructive pulmonary disease) (HCC)      pulmonary fibrosis   Coronary artery disease     Depression, reactive     Diabetes mellitus      type II    Diastolic dysfunction     Difficulty in walking     Family history of heart disease     Flu 06/26/2023    Generalized muscle weakness     Gout     High cholesterol     History of falling     Hypertension     Hypothyroidism     Interstitial lung disease (HCC)     Meningioma of left sphenoid wing involving cavernous sinus (HCC) 02/17/2012    Continue diplopia, left eye pain and left headaches.     Morbid obesity (HCC)     Neuromuscular disorder (HCC)      diabetic neuropathy    Normal coronary arteries      cardiac catheterization performed  10/31/14   Pneumonia     RA (rheumatoid arthritis) (HCC)  has been off methotreaxte since 10/13.   Septic shock (HCC) 06/13/2023   Spinal stenosis of lumbar region     Thyroid disease     Unspecified lack of coordination     URI (upper respiratory infection)     UTI (urinary tract infection) 03/31/2021    11/12 - 04/03/2021 Klebsiella UTI associated with encephalopathy in the context of AKI.  Status post IV ceftriaxone followed by oral Keflex x48 hours.            Has the patient had major surgery during 100 days prior to admission? Yes   Family History   family history includes Breast cancer in her sister and sister; Diabetes in her brother, mother, and sister; Esophageal cancer in her brother; Fibroids in her daughter; Gout in her brother; Heart attack in her brother and mother; Heart disease in her brother; Heart murmur in her son; Hypertension in her brother, daughter, daughter, father, and son; Kidney cancer in her brother; Kidney failure in her brother; Lung cancer in her father; Prostate cancer in her brother; Rheum arthritis in her maternal uncle; Stomach cancer in her brother; Uterine cancer in her daughter.   Current Medications  Current Medications    Current Facility-Administered Medications:    acetaminophen (TYLENOL) tablet 1,000 mg, 1,000 mg, Oral, Q6H PRN, 1,000 mg at 07/19/23 0558 **OR** acetaminophen (TYLENOL) suppository 650 mg, 650 mg, Rectal, Q6H PRN, Kabrich, Martha H, PA-C   albuterol (VENTOLIN HFA) 108 (90 Base) MCG/ACT  inhaler 2 puff, 2 puff, Inhalation, Q2H PRN, Kabrich, Martha H, PA-C   atorvastatin (LIPITOR) tablet 10 mg, 10 mg, Oral, Daily, Eric Form, PA-C, 10 mg at 07/19/23 9147   diclofenac Sodium (VOLTAREN) 1 % topical gel 2 g, 2 g, Topical, TID, Kabrich, Martha H, PA-C, 2 g at 07/19/23 0944   DULoxetine (CYMBALTA) DR capsule 60 mg, 60 mg, Oral, Daily, Eric Form, PA-C, 60 mg at 07/19/23 0938   enoxaparin (LOVENOX) injection 40 mg, 40 mg, Subcutaneous, Q24H, Eric Form, PA-C, 40 mg at 07/19/23 0941   feeding supplement (BOOST / RESOURCE BREEZE) liquid 1 Container, 1 Container, Oral, TID BM, Eric Form, PA-C, 1 Container at 07/19/23 0944   gabapentin (NEURONTIN) capsule 300 mg, 300 mg, Oral, BID, Eric Form, PA-C, 300 mg at 07/19/23 8295   guaiFENesin (MUCINEX) 12 hr tablet 1,200 mg, 1,200 mg, Oral, BID, Eric Form, PA-C, 1,200 mg at 07/19/23 6213   insulin aspart (novoLOG) injection 0-6 Units, 0-6 Units, Subcutaneous, TID WC, Kabrich, Martha H, PA-C   levothyroxine (SYNTHROID) tablet 50 mcg, 50 mcg, Oral, Daily, Eric Form, PA-C, 50 mcg at 07/19/23 0558   lidocaine (LIDODERM) 5 % 1 patch, 1 patch, Transdermal, Q24H, Eric Form, PA-C, 1 patch at 07/19/23 0865   magnesium sulfate IVPB 2 g 50 mL, 2 g, Intravenous, Once, Ghimire, Werner Lean, MD, Last Rate: 50 mL/hr at 07/19/23 1305, 2 g at 07/19/23 1305   menthol-cetylpyridinium (CEPACOL) lozenge 3 mg, 1 lozenge, Oral, PRN, Dolly Rias, MD, 3 mg at 07/18/23 2119   methocarbamol (ROBAXIN) tablet 500 mg, 500 mg, Oral, Q6H PRN, Eric Form, PA-C   metoprolol succinate (TOPROL-XL) 24 hr tablet 50 mg, 50 mg, Oral, Daily, Ghimire, Shanker M, MD, 50 mg at 07/19/23 0936   morphine (MS CONTIN) 12 hr tablet 15 mg, 15 mg, Oral, Q12H, Eric Form, PA-C, 15 mg at 07/19/23 0940   morphine (PF) 2 MG/ML injection 1 mg, 1 mg, Intravenous, Q3H  PRN, Eric Form, PA-C   ondansetron [facility]) injection 4  mg, 4 mg, Intravenous, Q8H PRN, Eric Form, PA-C, 4 mg at 07/15/23 2354   oxyCODONE (Oxy IR/ROXICODONE) immediate release tablet 5 mg, 5 mg, Oral, Q6H PRN, Eric Form, PA-C, 5 mg at 07/18/23 1643   pantoprazole (PROTONIX) EC tablet 40 mg, 40 mg, Oral, BID, Ghimire, Werner Lean, MD, 40 mg at 07/19/23 0940   [START ON 07/20/2023] predniSONE (DELTASONE) tablet 10 mg, 10 mg, Oral, Q breakfast, Ghimire, Shanker M, MD, 10 mg at 07/19/23 7253   predniSONE (DELTASONE) tablet 20 mg, 20 mg, Oral, Once, Ghimire, Werner Lean, MD   sodium chloride (OCEAN) 0.65 % nasal spray 1 spray, 1 spray, Each Nare, PRN, Dolly Rias, MD, 1 spray at 07/18/23 6644   sodium chloride flush (NS) 0.9 % injection 3 mL, 3 mL, Intravenous, Q12H, Eric Form, PA-C, 3 mL at 07/18/23 2119     Patients Current Diet:  Diet Order                  Diet full liquid Room service appropriate? Yes; Fluid consistency: Thin  Diet effective now                         Precautions / Restrictions Precautions Precautions: Fall Precaution/Restrictions Comments: ostomy Restrictions Weight Bearing Restrictions Per Provider Order: No    Has the patient had 2 or more falls or a fall with injury in the past year? Yes   Prior Activity Level Community (5-7x/wk): Pt. active in the community PTA   Prior Functional Level Self Care: Did the patient need help bathing, dressing, using the toilet or eating? Independent   Indoor Mobility: Did the patient need assistance with walking from room to room (with or without device)? Independent   Stairs: Did the patient need assistance with internal or external stairs (with or without device)? Independent   Functional Cognition: Did the patient need help planning regular tasks such as shopping or remembering to take medications? Independent   Patient Information Are you of Hispanic, Latino/a,or Spanish origin?: A. No, not of Hispanic, Latino/a, or Spanish origin What is your  race?: B. Black or African American Do you need or want an interpreter to communicate with a doctor or health care staff?: 0. No   Patient's Response To:  Health Literacy and Transportation Is the patient able to respond to health literacy and transportation needs?: Yes Health Literacy - How often do you need to have someone help you when you read instructions, pamphlets, or other written material from your doctor or pharmacy?: Never In the past 12 months, has lack of transportation kept you from medical appointments or from getting medications?: No In the past 12 months, has lack of transportation kept you from meetings, work, or from getting things needed for daily living?: No   Home Assistive Devices / Equipment Home Equipment: Occupational hygienist (4 wheels), Wheelchair - manual, Agricultural consultant (2 wheels), Shower seat, BSC/3in1   Prior Device Use: Indicate devices/aids used by the patient prior to current illness, exacerbation or injury? None of the above   Current Functional Level Cognition   Overall Cognitive Status: Within Functional Limits for tasks assessed Difficult to assess due to: Impaired communication Orientation Level: Oriented X4 General Comments: Appeared to be [facility]    Extremity Assessment (includes Sensation/Coordination)   Upper Extremity Assessment: Generalized weakness  Lower Extremity Assessment: Generalized weakness     ADLs  Overall ADL's : Needs assistance/impaired Eating/Feeding: Independent, Bed level Grooming: Sitting, Wash/dry face, Set up Upper Body Bathing: Sitting, Minimal assistance Lower Body Bathing: Sitting/lateral leans, Maximal assistance Upper Body Dressing : Sitting, Minimal assistance Lower Body Dressing: Total assistance, Sitting/lateral leans Toilet Transfer: Rolling walker (2 wheels), Stand-pivot, Moderate assistance Toilet Transfer Details (indicate cue type and reason): sim with recliner Toileting- Clothing Manipulation and Hygiene: Sit to/from  stand, Total assistance Functional mobility during ADLs: Moderate assistance, Rolling walker (2 wheels) (pivot to recliner')     Mobility   Overal bed mobility: Needs Assistance Bed Mobility: Supine to Sit Rolling: Min assist Sidelying to sit: HOB elevated, Used rails, Min assist Supine to sit: Mod assist General bed mobility comments: Mod A to assist with bringing BLEs to EOB     Transfers   Overall transfer level: Needs assistance Equipment used: Rolling walker (2 wheels) Transfers: Sit to/from Stand, Bed to chair/wheelchair/BSC Sit to Stand: Min assist Bed to/from chair/wheelchair/BSC transfer type:: Stand pivot Stand pivot transfers: Mod assist General transfer comment: Mod A to pivot to recliner, poor balance control needs close pivots, pt wants to sit prematurely. Forward flexed posture     Ambulation / Gait / Stairs / Wheelchair Mobility   Ambulation/Gait Ambulation/Gait assistance: Editor, commissioning (Feet): 3 Feet Assistive device: Rolling walker (2 wheels) Gait Pattern/deviations: Step-to pattern General Gait Details: Cues for upright posture, able to march in place and progress with lateral steps along bed. HR did jump to 150s briefly but recovers to low 100s with seated rest. Lt knee a little unstable but no overt buckling and when pt able to extend arms on RW as instructed, stabilizes quite well. Gait velocity: decreased Pre-gait activities: Weight shift, static march, upright posture in RW with BIL UE support. at [facility] level     Posture / Balance Balance Overall balance assessment: Needs assistance Sitting-balance support: Feet supported Sitting balance-Leahy Scale: Good Standing balance support: Bilateral upper extremity supported, During functional activity, Reliant on assistive device for balance Standing balance-Leahy Scale: Poor Standing balance comment: forward flexion due to reported spinal stenosis     Special needs/care consideration Special service  needs none     Previous Home Environment (from acute therapy documentation) Living Arrangements: Spouse/significant other Available Help at Discharge: Family, Available 24 hours/day (aides m-f 9-1) Type of Home: House Home Layout: Two level, Able to live on main level with bedroom/bathroom Home Access: Stairs to enter Entrance Stairs-Rails: None Entrance Stairs-Number of Steps: 1 Bathroom Shower/Tub: Health visitor: Handicapped height Bathroom Accessibility: Yes Home Care Services: No Additional Comments: 24/7 assist available from husband and brother-in-law who lives with patient. aide M-F   Discharge Living Setting Plans for Discharge Living Setting: Patient's home Type of Home at Discharge: House Discharge Home Layout: One level Discharge Home Access: Stairs to enter Entrance Stairs-Rails: None Entrance Stairs-Number of Steps: 1 Discharge Bathroom Shower/Tub: Walk-in shower Discharge Bathroom Toilet: Handicapped height Discharge Bathroom Accessibility: Yes How Accessible: Other (comment), Accessible via walker Does the patient have any problems obtaining your medications?: No   Social/Family/Support Systems Patient Roles: Spouse, Other (Comment) Contact Information: [phone] Anticipated Caregiver: 24/7 Anticipated Caregiver's Contact Information: Pt. and husband live with her husband live with son Lyda Jester. He works 3rd shift. husband can provide 24/7 supervision and other family may stay at nights. Ability/Limitations of Caregiver: Min-mod A Caregiver Availability: 24/7 Discharge Plan Discussed with Primary Caregiver: Yes Is Caregiver In Agreement with Plan?: Yes Does Caregiver/Family have Issues with Lodging/Transportation while Pt  is in Rehab?: Yes   Goals Patient/Family Goal for Rehab: PT/OT Min A to Supervision Expected length of stay: 12-14 days Pt/Family Agrees to Admission and willing to participate: Yes Program Orientation Provided & Reviewed  with Pt/Caregiver Including Roles  & Responsibilities: Yes   Decrease burden of Care through IP rehab admission: not anticipated   Possible need for SNF placement upon discharge: not anticipated    Patient Condition: I have reviewed medical records from [facility] , spoken with CM, and patient and family member. I met with patient at the bedside for inpatient rehabilitation assessment.  Patient will benefit from ongoing PT, OT, and SLP, can actively participate in 3 hours of therapy a day 5 days of the week, and can make measurable gains during the admission.  Patient will also benefit from the coordinated team approach during an Inpatient Acute Rehabilitation admission.  The patient will receive intensive therapy as well as Rehabilitation physician, nursing, social worker, and care management interventions.  Due to safety, skin/wound care, disease management, medication administration, pain management, and patient education the patient requires 24 hour a day rehabilitation nursing.  The patient is currently min A-max A  with mobility and basic ADLs.  Discharge setting and therapy post discharge at home with home health is anticipated.  Patient has agreed to participate in the Acute Inpatient Rehabilitation Program and will admit today.   Preadmission Screen Completed By:  Jeronimo Greaves, 07/19/2023 1:28 PM ______________________________________________________________________   Discussed status with Dr. Skip Estimable  on 07/20/23 at 900 and received approval for admission today.   Admission Coordinator:  Jeronimo Greaves, CCC-SLP, time 930/Date 07/20/23    Assessment/Plan: Diagnosis:  septic shock/debility due to symptomatic chronic sigmoid colon stricture  Does the need for close, 24 hr/day Medical supervision in concert with the patient's rehab needs make it unreasonable for this patient to be served in a less intensive setting? Yes Co-Morbidities requiring supervision/potential  complications: Rheumatoid arthritis, DM type 2, COPD, Chronic Diastolic heart failure, CKD III, iron deficiency anemia, obesity, history of meningioma, chronic respiratory failure, hypothyroidism, hyperlipidemia Due to bladder management, bowel management, safety, skin/wound care, disease management, medication administration, pain management, and patient education, does the patient require 24 hr/day rehab nursing? Yes Does the patient require coordinated care of a physician, rehab nurse, PT, OT, and SLP to address physical and functional deficits in the context of the above medical diagnosis(es)? Yes Addressing deficits in the following areas: balance, endurance, locomotion, strength, transferring, bowel/bladder control, bathing, dressing, feeding, grooming, toileting, cognition, speech, language, swallowing, and psychosocial support Can the patient actively participate in an intensive therapy program of at least 3 hrs of therapy 5 days a week? Yes The potential for patient to make measurable gains while on inpatient rehab is excellent Anticipated functional outcomes upon discharge from inpatient rehab: supervision PT, supervision OT, supervision SLP Estimated rehab length of stay to reach the above functional goals is: 12-14 Anticipated discharge destination: Home 10. Overall Rehab/Functional Prognosis: excellent     MD Signature: Fanny Dance

## 2023-07-20 NOTE — H&P (Signed)
 Physical Medicine and Rehabilitation Admission H&P  Chief Complaint  Patient presents with   Weakness   : HPI: [name] is a 83 year old right-handed female with history significant for chronic sigmoid stricture related to diverticular disease, hypertension, hyperlipidemia, diabetes mellitus with peripheral neuropathy, hypothyroidism, CKD stage III, gout, ILD, chronic respiratory failure/COPD on home oxygen with 3 L at baseline, chronic diastolic congestive heart failure, rheumatoid arthritis with chronic steroids, history of GI bleed, DVT/PE, chronic pain maintained on MS Contin 15 mg every 12 hours as well as oxycodone as needed, iron deficiency anemia, obesity with BMI 35.42, 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  Per chart review patient lives with spouse and son.  She has an aide Monday through Friday 9-1.  Two-level home bed and bath main level with one-step to entry.  Modified independent for ADLs and rollator for mobility.  Patient with recent admission for sepsis 06/30/2023 - 07/10/2023 as well as viral respiratory illness and discharged for short time to skilled nursing facility as well as recent admission 06/13/2023 - 06/26/2023 for septic shock secondary to flu A/RSV pneumonia/UTI.  Presented 07/15/2023 with abdominal pain and diarrhea with black stools as well as hypotension.  She was given IV fluids with improvement in blood pressure.  Admission chemistries unremarkable except WBC 11,900, hemoglobin 11.8, potassium 3.4, CO2 21, creatinine 1.09, lipase of 20, lactic acid 3.1, blood cultures no growth to date, urinalysis negative nitrite.  Guaiac positive and noted low-grade fever.  Code sepsis was initiated.  Cranial CT scan negative.  CT of the abdomen pelvis showed dilatation of colon-abrupt change in caliber noted involving the sigmoid colon-likely stricture.  General surgery consulted for large bowel obstruction secondary to  distal sigmoid stenosis undergoing laparoscopic assisted diverting loop colostomy 07/17/2023 per Dr.Tsuei.  Initially maintained on broad-spectrum antibiotics.  Blood pressure remained monitored initially managed with stress dose IV hydrocortisone/midodrine with significant improvement and midodrine has been discontinued transition hydrocortisone to prednisone.  Patient's hemoglobin and hematocrit remained stable.  Rheumatoid arthritis currently with Plaquenil/Leflunomide on hold.  She reports chronic joint pain particularly in her shoulders, hands and feet.  She did not sleep well yesterday due to headache.  She says prednisone sometimes contributes to her headaches.  Patient's prednisone has been resumed.  Headache has resolved today.  She did have a run of SVT and Toprol has been adjusted.  Subcutaneous Lovenox for DVT prophylaxis.  She is currently on a soft  diet advanced as tolerated.  Therapy evaluations completed due to patient's decreased functional mobility was admitted for a comprehensive rehab program  Review of Systems  Constitutional:  Positive for fever. Negative for chills.  HENT:  Negative for hearing loss.   Eyes:  Negative for blurred vision and double vision.  Respiratory:  Positive for shortness of breath. Negative for wheezing.   Cardiovascular:  Positive for chest pain (Intermittant, not currently having this), palpitations and leg swelling.  Gastrointestinal:  Positive for blood in stool, diarrhea, nausea and vomiting.  Genitourinary:  Positive for urgency. Negative for dysuria, flank pain and hematuria.  Musculoskeletal:  Positive for back pain, joint pain and myalgias.  Skin:  Negative for rash.  Neurological:  Positive for weakness and headaches. Negative for sensory change and speech change.  Psychiatric/Behavioral:         Anxiety  All other systems reviewed and are negative.  Past Medical History:  Diagnosis Date   Acute encephalopathy 03/30/2021   11/12 -  04/03/2021  acute encephalopathy in the context of altered mental status and poor oral intake following increasing pain medicines and associated Klebsiella UTI.     Acute kidney injury superimposed on chronic kidney disease (HCC) 04/17/2015   11/12 - 04/03/2021 AKI with creatinine of 2.22 and GFR 22 indicating stage IV renal disease in the context of encephalopathy secondary to increased pain medications with decreased oral intake of fluids and nutrition.     Acute on chronic diastolic (congestive) heart failure (HCC)    Anemia    iron deficiency anemia - secondary to blood loss ( chronic)    Anxiety    Arthritis    endstage changes bilateral knees/bilateral ankles.    Asthma    Carotid artery occlusion    Chest pain of uncertain etiology 01/28/2012   Normal coronaries at cath     Chronic fatigue    Chronic kidney disease    Closed intertrochanteric fracture of hip, left, initial encounter (HCC) 11/29/2016   Closed left hip fracture (HCC)    Closed nondisplaced intertrochanteric fracture of left femur with delayed healing 01/14/2017   Clotting disorder (HCC)    pt denies this   Contusion of left knee    due to fall 1/14.   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     Morbid obesity (HCC)    Neuromuscular disorder (HCC)    diabetic neuropathy    Normal coronary arteries    cardiac catheterization performed  10/31/14   Pneumonia    RA (rheumatoid arthritis) (HCC)    has been off methotreaxte  since 10/13.   Septic shock (HCC) 06/13/2023   Spinal stenosis of lumbar region    Thyroid disease    Unspecified lack of coordination    URI (upper respiratory infection)    UTI (urinary tract infection) 03/31/2021   11/12 - 04/03/2021 Klebsiella UTI associated with encephalopathy in the context of AKI.  Status post IV ceftriaxone followed by oral Keflex x48 hours.     Past Surgical History:  Procedure Laterality Date   BRAIN SURGERY     Gamma knife 10/13. Needs repeat spring  '14   CARDIAC CATHETERIZATION N/A 10/31/2014   Procedure: Right/Left Heart Cath and Coronary Angiography;  Surgeon: Corky Crafts, MD;  Location: [facility] INVASIVE CV LAB;  Service: Cardiovascular;  Laterality: N/A;   COLONOSCOPY WITH PROPOFOL N/A 04/14/2022   Procedure: COLONOSCOPY WITH PROPOFOL;  Surgeon: Beverley Fiedler, MD;  Location: [facility] ENDOSCOPY;  Service: Gastroenterology;  Laterality: N/A;   CYST EXCISION     2 on face   CYST REMOVAL NECK     ESOPHAGOGASTRODUODENOSCOPY (EGD) WITH PROPOFOL N/A 09/14/2014   Procedure: ESOPHAGOGASTRODUODENOSCOPY (EGD) WITH PROPOFOL;  Surgeon: Louis Meckel, MD;  Location: [facility] ENDOSCOPY;  Service: Endoscopy;  Laterality: N/A;   INCISION AND DRAINAGE HIP Left 01/16/2017   Procedure: IRRIGATION AND DEBRIDEMENT LEFT HIP;  Surgeon: Kathryne Hitch, MD;  Location: [facility] ORS;  Service: Orthopedics;  Laterality: Left;   INTRAMEDULLARY (IM) NAIL INTERTROCHANTERIC Left 11/29/2016   Procedure: INTRAMEDULLARY (IM) NAIL INTERTROCHANTRIC;  Surgeon: Kathryne Hitch, MD;  Location: [facility] OR;  Service: Orthopedics;  Laterality: Left;   LAPAROSCOPIC LOOP COLOSTOMY N/A 07/17/2023   Procedure: LAPAROSCOPIC DIVERTING LOOP COLOSTOMY;  Surgeon: Manus Rudd, MD;  Location: [facility] OR;  Service: General;  Laterality: N/A;   OVARY SURGERY     RIGHT HEART CATH N/A 08/12/2021   Procedure: RIGHT HEART CATH;  Surgeon: Runell Gess, MD;  Location: [facility] INVASIVE CV LAB;  Service: Cardiovascular;   Laterality: N/A;   SHOULDER SURGERY Left    TONSILLECTOMY  age 17   VAGINAL HYSTERECTOMY     VIDEO BRONCHOSCOPY Bilateral 05/31/2013   Procedure: VIDEO BRONCHOSCOPY WITHOUT FLUORO;  Surgeon: Kalman Shan, MD;  Location: [facility] ENDOSCOPY;  Service: Cardiopulmonary;  Laterality: Bilateral;   video bronscoscopy  05/19/1998   lung   Family History  Problem Relation Age of Onset   Diabetes Mother    Heart attack Mother    Hypertension Father    Lung cancer Father    Diabetes Sister    Breast cancer Sister    Breast cancer Sister    Diabetes Brother    Hypertension Brother    Heart disease Brother    Heart attack Brother    Kidney cancer Brother    Gout Brother    Kidney failure Brother        x 5   Esophageal cancer Brother    Stomach cancer Brother    Prostate cancer Brother    Uterine cancer Daughter        with mets   Fibroids Daughter    Hypertension Daughter    Hypertension Daughter    Hypertension Son    Heart murmur Son    Rheum arthritis Maternal Uncle    Alzheimer's disease Neg Hx    Dementia Neg Hx    Social History:  reports that she has never smoked. She has never used smokeless tobacco. She reports that she does not drink alcohol and does not use drugs. Allergies:  Allergies  Allergen Reactions   Codeine Swelling and Other (See Comments)    Facial swelling, Chest pain, and swelling in legs    Infliximab Anaphylaxis    (REMICADE) "sent me into shock"    Lisinopril Swelling and Rash    Face and neck swelling    Ofev [Nintedanib] Diarrhea   Medications Prior to Admission  Medication Sig Dispense Refill   albuterol (VENTOLIN HFA) 108 (90 Base) MCG/ACT inhaler Inhale 2 puffs into the lungs as needed for wheezing or shortness of breath.     Artificial Tear Ointment (DRY EYES OP) Place 2 drops into both eyes as needed (dry eyes).     atorvastatin (LIPITOR) 10 MG tablet Take 1 tablet (10 mg total) by mouth daily. 90 tablet 3   azithromycin (ZITHROMAX) 250  MG tablet Take 1 tablet (250 mg total) by mouth every Monday, Wednesday, and Friday. Take every Monday, Wednesday, Friday. 12 tablet 11   Calcium Carb-Cholecalciferol (CALCIUM + D3 PO) Take 2 tablets by mouth daily.     Camphor-Menthol-Methyl Sal (SALONPAS EX) Apply 1 patch topically as needed (shoulder and leg pain).     Cholecalciferol (VITAMIN D3) 50 MCG (2000 UT) capsule Take 2,000 Units by mouth daily.     diclofenac Sodium (VOLTAREN) 1 % GEL Apply 2 g topically 3 (  three) times daily. Apply to both hands and knees (Patient taking differently: Apply 2 g topically in the morning, at noon, and at bedtime. Apply to both hands and knees.) 100 g 1   dicyclomine (BENTYL) 20 MG tablet Take 1 tablet (20 mg total) by mouth 4 (four) times daily -  before meals and at bedtime.     diphenoxylate-atropine (LOMOTIL) 2.5-0.025 MG tablet Take 1 tablet by mouth 4 (four) times daily as needed for diarrhea or loose stools.     DULoxetine (CYMBALTA) 60 MG capsule Take 1 capsule (60 mg total) by mouth daily. 90 capsule 2   [START ON 07/21/2023] enoxaparin (LOVENOX) 40 MG/0.4ML injection Inject 0.4 mLs (40 mg total) into the skin daily.     ferrous sulfate 324 MG TBEC Take 324 mg by mouth every evening.     fluticasone (FLONASE) 50 MCG/ACT nasal spray Place 1 spray into both nostrils daily. 16 g 3   furosemide (LASIX) 20 MG tablet Take 1 tablet (20 mg total) by mouth daily.     gabapentin (NEURONTIN) 600 MG tablet Take 0.5 tablets (300 mg total) by mouth 2 (two) times daily.     guaiFENesin (MUCINEX) 600 MG 12 hr tablet Take 2 tablets (1,200 mg total) by mouth 2 (two) times daily. 120 tablet 0   [START ON 07/22/2023] hydroxychloroquine (PLAQUENIL) 200 MG tablet Take 1 tablet (200 mg total) by mouth daily.     [START ON 07/22/2023] leflunomide (ARAVA) 20 MG tablet Take 1 tablet (20 mg total) by mouth daily.     levothyroxine (SYNTHROID) 50 MCG tablet Take 1 tablet (50 mcg total) by mouth daily. 90 tablet 3   lidocaine  (LIDODERM) 5 % Place 1 patch onto the skin daily. Remove & Discard patch within 12 hours or as directed by MD 60 patch 11   linaclotide (LINZESS) 72 MCG capsule Take 72 mcg by mouth daily before breakfast.     metolazone (ZAROXOLYN) 2.5 MG tablet TAKE 1 TABLET BY MOUTH DAILY AS NEEDED FOR 2 DAYS FOR WEIGHT ABOVE 255 POUNDS 30 tablet 0   metoprolol succinate (TOPROL-XL) 50 MG 24 hr tablet Take 1 tablet (50 mg total) by mouth daily. Take with or immediately following a meal.     morphine (MS CONTIN) 15 MG 12 hr tablet Take 1 tablet (15 mg total) by mouth every 12 (twelve) hours. SNF use only 10 tablet 0   Multiple Vitamin (MULTIVITAMIN WITH MINERALS) TABS Take 1 tablet by mouth daily.      ondansetron (ZOFRAN-ODT) 4 MG disintegrating tablet Take 1 tablet (4 mg total) by mouth every 8 (eight) hours as needed for nausea or vomiting. 30 tablet 3   oxyCODONE (OXY IR/ROXICODONE) 5 MG immediate release tablet Take 1 tablet (5 mg total) by mouth every 6 (six) hours as needed for moderate pain (pain score 4-6). SNF use only 10 tablet 0   OXYGEN Inhale 3 L into the lungs See admin instructions. 3 L at bedtime, and 3 L during the day as needed for exertion     pantoprazole (PROTONIX) 40 MG tablet Take 1 tablet (40 mg total) by mouth 2 (two) times daily. 120 tablet 4   polyethylene glycol (MIRALAX / GLYCOLAX) 17 g packet Take 17 g by mouth 2 (two) times daily. Hold for diarrhea     Potassium Chloride ER 20 MEQ TBCR Please take 20 mg p.o. twice daily for first 3 days, then go back to 20 mg oral daily, monitor frequently and adjust  as needed (Patient taking differently: Take 20 mEq by mouth daily. Please take 20 mg p.o. twice daily for first 3 days, then go back to 20 mg oral daily, monitor frequently and adjust as needed)     predniSONE (DELTASONE) 10 MG tablet Take 1 tablet (10 mg total) by mouth daily with breakfast. 90 tablet 1   simethicone (MYLICON) 80 MG chewable tablet Chew 1 tablet (80 mg total) by mouth  every 6 (six) hours as needed for flatulence.     zinc oxide 20 % ointment Apply 1 Application topically See admin instructions. Every shift. Apply to MASD to left buttocks and with each incontinent episode.        Home: Home Living Family/patient expects to be discharged to:: Inpatient rehab Living Arrangements: Spouse/significant other Available Help at Discharge: Family, Available 24 hours/day (aides m-f 9-1) Type of Home: House Home Access: Stairs to enter [employer] of Steps: 1 Entrance Stairs-Rails: None Home Layout: Two level, Able to live on main level with bedroom/bathroom Bathroom Shower/Tub: Health visitor: Handicapped height Bathroom Accessibility: Yes Home Equipment: Rollator (4 wheels), Wheelchair - manual, Agricultural consultant (2 wheels), Shower seat, BSC/3in1 Additional Comments: 24/7 assist available from husband and brother-in-law who lives with patient. aide M-F   Functional History: Prior Function Prior Level of Function : Independent/Modified Independent Mobility Comments: rollator in the house, wheelchair community distances ADLs Comments: MOD I for ADL, assist for IADLs prior to previous admission; now requires assist for all ADL/IADL   Functional Status:  Mobility: Bed Mobility Overal bed mobility: Needs Assistance Bed Mobility: Supine to Sit Rolling: Min assist Sidelying to sit: HOB elevated, Used rails, Min assist Supine to sit: Mod assist General bed mobility comments: Mod A to assist with bringing BLEs to EOB Transfers Overall transfer level: Needs assistance Equipment used: Rolling walker (2 wheels) Transfers: Sit to/from Stand, Bed to chair/wheelchair/BSC Sit to Stand: Min assist Bed to/from chair/wheelchair/BSC transfer type:: Stand pivot Stand pivot transfers: Mod assist General transfer comment: Mod A to pivot to recliner, poor balance control needs close pivots, pt wants to sit prematurely. Forward flexed  posture Ambulation/Gait Ambulation/Gait assistance: Min assist Gait Distance (Feet): 3 Feet Assistive device: Rolling walker (2 wheels) Gait Pattern/deviations: Step-to pattern General Gait Details: Cues for upright posture, able to march in place and progress with lateral steps along bed. HR did jump to 150s briefly but recovers to low 100s with seated rest. Lt knee a little unstable but no overt buckling and when pt able to extend arms on RW as instructed, stabilizes quite well. Gait velocity: decreased Pre-gait activities: Weight shift, static march, upright posture in RW with BIL UE support. at [facility] level   ADL: ADL Overall ADL's : Needs assistance/impaired Eating/Feeding: Independent, Bed level Grooming: Sitting, Wash/dry face, Set up Upper Body Bathing: Sitting, Minimal assistance Lower Body Bathing: Sitting/lateral leans, Maximal assistance Upper Body Dressing : Sitting, Minimal assistance Lower Body Dressing: Total assistance, Sitting/lateral leans Toilet Transfer: Rolling walker (2 wheels), Stand-pivot, Moderate assistance Toilet Transfer Details (indicate cue type and reason): sim with recliner Toileting- Clothing Manipulation and Hygiene: Sit to/from stand, Total assistance Functional mobility during ADLs: Moderate assistance, Rolling walker (2 wheels) (pivot to recliner')   Cognition: Cognition Overall Cognitive Status: Within Functional Limits for tasks assessed Orientation Level: Oriented X4 Cognition Arousal: Alert Behavior During Therapy: WFL for tasks assessed/performed Overall Cognitive Status: Within Functional Limits for tasks assessed General Comments: Appeared to be [facility] Difficult to assess due to: Impaired communication Cognition  Arousal: Alert Behavior During Therapy: WFL for tasks assessed/performed Overall Cognitive Status: Within Functional Limits for tasks assessed General Comments: Appeared to be [facility] Difficult to assess due to: Impaired  communication  Physical Exam: Blood pressure (!) 145/93, pulse 94, temperature 97.9 F (36.6 C), temperature source Oral, resp. rate 15, height 5\' 3"  (1.6 m), weight 90.7 kg, SpO2 100%.   General: No apparent distress, sitting in bedside chair HEENT: Head is normocephalic, atraumatic, sclera anicteric, oral mucosa pink and moist, + nasal cannula 2 L O2 Neck: Supple without JVD or lymphadenopathy Heart: Reg rate and rhythm. No murmurs rubs or gallops Chest: CTA bilaterally without wheezes, rales, or rhonchi; no distress Abdomen: Soft, non-tender, mildly-distended, bowel sounds positive.  Ostomy with brown liquid stool Abdominal incision CDI Extremities: + Lower extremity edema bilaterally Psych: Pt's affect is appropriate. Pt is cooperative Skin: Clean and intact without signs of breakdown Neuro:    Mental Status: AAOx3, memory grossly intact, fund of knowledge appropriate Speech/Languate: Follows commands CRANIAL NERVES: Cranial nerves II through XII grossly intact   MOTOR: RUE: 3/5 Deltoid, 4/5 Biceps, 4/5 Triceps,4/5 Grip LUE: 3/5 Deltoid, 4/5 Biceps, 4/5 Triceps, 4/5 Grip RLE: HF 4-/5, KE 4/5, ADF 4/5, APF 4/5 LLE: HF 4-/5, KE 4/5, ADF 4/5, APF 4/5  SENSORY: Normal to touch all 4 extremities  Coordination: Normal finger to nose, no tremor, no dysmetria  MSK: Bilateral shoulder pain with ROM-chronic    Results for orders placed or performed during the hospital encounter of 07/15/23 (from the past 48 hours)  CBC     Status: Abnormal   Collection Time: 07/19/23  5:31 AM  Result Value Ref Range   WBC 6.9 4.0 - 10.5 K/uL   RBC 3.28 (L) 3.87 - 5.11 MIL/uL   Hemoglobin 9.9 (L) 12.0 - 15.0 g/dL   HCT 30.8 (L) 65.7 - 84.6 %   MCV 95.1 80.0 - 100.0 fL   MCH 30.2 26.0 - 34.0 pg   MCHC 31.7 30.0 - 36.0 g/dL   RDW 96.2 (H) 95.2 - 84.1 %   Platelets 181 150 - 400 K/uL   nRBC 0.0 0.0 - 0.2 %    Comment: Performed at [facility], [address], [place], [place]  [address]  Basic metabolic panel     Status: Abnormal   Collection Time: 07/19/23  5:31 AM  Result Value Ref Range   Sodium 140 135 - 145 mmol/L   Potassium 2.7 (LL) 3.5 - 5.1 mmol/L    Comment: CRITICAL RESULT CALLED TO, READ BACK BY AND VERIFIED WITH B. Cassells RN ,  @0717 , 07/19/23, Dabdee,T.   Chloride 106 98 - 111 mmol/L   CO2 23 22 - 32 mmol/L   Glucose, Bld 101 (H) 70 - 99 mg/dL    Comment: Glucose reference range applies only to samples taken after fasting for at least 8 hours.   BUN 8 8 - 23 mg/dL   Creatinine, Ser 1.02 0.44 - 1.00 mg/dL   Calcium 7.7 (L) 8.9 - 10.3 mg/dL   GFR, Estimated >72 >53 mL/min    Comment: (NOTE) Calculated using the CKD-EPI Creatinine Equation (2021)    Anion gap 11 5 - 15    Comment: Performed at [facility], [address] [place], [place] [address]  Glucose, capillary     Status: None   Collection Time: 07/19/23  8:13 AM  Result Value Ref Range   Glucose-Capillary 91 70 - 99 mg/dL    Comment: Glucose reference  range applies only to samples taken after fasting for at least 8 hours.  Glucose, capillary     Status: None   Collection Time: 07/19/23 11:27 AM  Result Value Ref Range   Glucose-Capillary 98 70 - 99 mg/dL    Comment: Glucose reference range applies only to samples taken after fasting for at least 8 hours.  Glucose, capillary     Status: Abnormal   Collection Time: 07/19/23  3:54 PM  Result Value Ref Range   Glucose-Capillary 117 (H) 70 - 99 mg/dL    Comment: Glucose reference range applies only to samples taken after fasting for at least 8 hours.  Glucose, capillary     Status: Abnormal   Collection Time: 07/19/23  9:45 PM  Result Value Ref Range   Glucose-Capillary 106 (H) 70 - 99 mg/dL    Comment: Glucose reference range applies only to samples taken after fasting for at least 8 hours.  Basic metabolic panel     Status: Abnormal   Collection Time: 07/20/23  4:44 AM  Result Value Ref Range   Sodium 141 135 - 145 mmol/L    Potassium 4.3 3.5 - 5.1 mmol/L   Chloride 109 98 - 111 mmol/L   CO2 26 22 - 32 mmol/L   Glucose, Bld 109 (H) 70 - 99 mg/dL    Comment: Glucose reference range applies only to samples taken after fasting for at least 8 hours.   BUN 8 8 - 23 mg/dL   Creatinine, Ser 9.14 0.44 - 1.00 mg/dL   Calcium 7.8 (L) 8.9 - 10.3 mg/dL   GFR, Estimated >78 >29 mL/min    Comment: (NOTE) Calculated using the CKD-EPI Creatinine Equation (2021)    Anion gap 6 5 - 15    Comment: Performed at [facility], [address] [place], [place] [address]  Magnesium     Status: None   Collection Time: 07/20/23  4:44 AM  Result Value Ref Range   Magnesium 1.8 1.7 - 2.4 mg/dL    Comment: Performed at [facility], [address] [place], [place] [address]  Glucose, capillary     Status: None   Collection Time: 07/20/23  8:35 AM  Result Value Ref Range   Glucose-Capillary 80 70 - 99 mg/dL    Comment: Glucose reference range applies only to samples taken after fasting for at least 8 hours.  Glucose, capillary     Status: None   Collection Time: 07/20/23 12:06 PM  Result Value Ref Range   Glucose-Capillary 99 70 - 99 mg/dL    Comment: Glucose reference range applies only to samples taken after fasting for at least 8 hours.   *Note: Due to a large number of results and/or encounters for the requested time period, some results have not been displayed. A complete set of results can be found in Results Review.   No results found.   Blood pressure (!) 145/93, pulse 94, temperature 97.9 F (36.6 C), temperature source Oral, resp. rate 15, height 5\' 3"  (1.6 m), weight 90.7 kg, SpO2 100%.   Medical Problem List and Plan: 1. Functional deficits secondary to septic shock/debility due to symptomatic chronic sigmoid colon stricture.  Status post diverting loop ostomy 2/28  -patient may shower if ostomy covered  -ELOS/Goals: 12-14, Min A to sup PT/OT  -Admit to CIR 2.   Antithrombotics: -DVT/anticoagulation:  Pharmaceutical: Lovenox  -antiplatelet therapy: N/A 3. Pain Management/chronic pain: Neurontin 300 mg twice daily, Voltaren gel 2 g  3 times daily, Lidoderm patch as directed, MS Contin 15 mg every 12 hours, oxycodone as needed, Robaxin as needed 4. Mood/Behavior/Sleep: Cymbalta 60 mg daily  -antipsychotic agents: N/A 5. Neuropsych/cognition: This patient is capable of making decisions on her own behalf. 6. Skin/Wound Care: Routine skin checks 7. Fluids/Electrolytes/Nutrition: Routine in and outs with follow-up chemistries  Hypokalemia- Recheck labs 8.  Hypotension.  Resolved.  Midodrine discontinued.  Toprol-XL 50 mg daily.  Home Benicar/hydralazine on hold resumed as needed.   9.  Rheumatoid arthritis.  Chronic prednisone resume.Leflunomide/Plaquenil currently on hold.  Can be resumed in the next couple of days 10.  Hyperlipidemia.  Lipitor 11.  Diabetes mellitus type 2 with peripheral neuropathy.  Hemoglobin A1c 5.6.  SSI.   -CBGs well controlled 12.  Hypothyroidism.  Synthroid 13.  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  Chronic oxygen.  3 L at baseline prior to admission.  Prophylactic Zithromax resumed 14.  Chronic diastolic congestive heart failure.  Monitor for any signs of fluid overload. Daily weights 15.  CKD stage III.  Follow-up chemistries. Last Cr 0.66 3/3 16.  Iron deficiency anemia.  Follow-up CBC 17.  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  Status post gamma knife 10/13.  Follow-up outpatient 18.  Obesity.  BMI 35.42.  Dietary follow-up 19. HLD. Continue stain   Charlton Amor, PA-C 07/20/2023  I have personally performed a face to face diagnostic evaluation of this patient and formulated the key components of the plan.  Additionally, I have personally reviewed laboratory data, imaging studies, as well as relevant notes and concur with the physician assistant's documentation above.  The  patient's status has not changed from the original H&P.  Any changes in documentation from the acute care chart have been noted above.  Fanny Dance, MD, [place] [facility]

## 2023-07-20 NOTE — TOC Transition Note (Signed)
 Transition of Care [facility]) - Discharge Note   Patient Details  Name: MARTINIQUE PIZZIMENTI MRN: [id number] Date of Birth: [birth year]  [facility] CM/SW Contact:  Mearl Latin, LCSW Phone Number: 07/20/2023, 10:52 AM   Clinical Narrative:    Patient has been accepted by [facility]. No further TOC needs identified.    Final next level of care: IP Rehab Facility Barriers to Discharge: Barriers Resolved   Patient Goals and CMS Choice Patient states their goals for this hospitalization and ongoing recovery are:: rehab CMS Medicare.gov Compare Post Acute Care list provided to:: Patient Represenative (must comment) Choice offered to / list presented to : Adult Children      Discharge Placement                    Patient and family notified of of transfer: 07/20/23  Discharge Plan and Services Additional resources added to the After Visit Summary for   In-house Referral: Clinical Social Work   Post Acute Care Choice: Skilled Nursing Facility                               Social Drivers of Health (SDOH) Interventions SDOH Screenings   Food Insecurity: No Food Insecurity (07/16/2023)  Housing: Low Risk  (07/16/2023)  Transportation Needs: No Transportation Needs (07/16/2023)  Utilities: Not At Risk (07/16/2023)  Alcohol Screen: Low Risk  (10/27/2022)  Depression (PHQ2-9): High Risk (06/12/2023)  Financial Resource Strain: Low Risk  (06/11/2023)  Physical Activity: Inactive (06/11/2023)  Social Connections: Socially Integrated (07/16/2023)  Stress: No Stress Concern Present (10/27/2022)  Tobacco Use: Low Risk  (07/17/2023)     Readmission Risk Interventions    07/17/2023    3:28 PM 07/08/2023   10:09 AM 12/16/2022   11:09 AM  Readmission Risk Prevention Plan  Transportation Screening Complete Complete Complete  PCP or Specialist Appt within 5-7 Days   Complete  Home Care Screening   Complete  Medication Review (RN CM)   Complete  Medication Review (RN Care  Manager) Complete Referral to Pharmacy   PCP or Specialist appointment within 3-5 days of discharge Complete Complete   HRI or Home Care Consult Complete Complete   SW Recovery Care/Counseling Consult Complete    Palliative Care Screening Not Applicable Not Applicable   Skilled Nursing Facility Complete Complete

## 2023-07-20 NOTE — Progress Notes (Signed)
 Second attempt to call report - told to "call back later"

## 2023-07-20 NOTE — Progress Notes (Signed)
 3 Days Post-Op   Subjective/Chief Complaint: Tol liquids, minimal nausea, has output in bag   Objective: Vital signs in last 24 hours: Temp:  [97.6 F (36.4 C)-97.9 F (36.6 C)] 97.8 F (36.6 C) (03/03 0833) Pulse Rate:  [89-100] 94 (03/03 0833) Resp:  [12-20] 19 (03/03 0833) BP: (116-165)/(72-103) 139/72 (03/03 0833) SpO2:  [98 %-100 %] 98 % (03/03 0833) Last BM Date : 07/19/23  Intake/Output from previous day: 03/02 0701 - 03/03 0700 In: 240 [P.O.:240] Out: 650 [Stool:650] Intake/Output this shift: No intake/output data recorded.  Ab soft nondistended approp tender, incisions clean   Lab Results:  Recent Labs    07/18/23 0534 07/19/23 0531  WBC 7.3 6.9  HGB 9.3* 9.9*  HCT 29.3* 31.2*  PLT 180 181   BMET Recent Labs    07/19/23 0531 07/20/23 0444  NA 140 141  K 2.7* 4.3  CL 106 109  CO2 23 26  GLUCOSE 101* 109*  BUN 8 8  CREATININE 0.60 0.66  CALCIUM 7.7* 7.8*   PT/INR No results for input(s): "LABPROT", "INR" in the last 72 hours. ABG No results for input(s): "PHART", "HCO3" in the last 72 hours.  Invalid input(s): "PCO2", "PO2"  Studies/Results: No results found.  Anti-infectives: Anti-infectives (From admission, onward)    Start     Dose/Rate Route Frequency Ordered Stop   07/16/23 0400  metroNIDAZOLE (FLAGYL) IVPB 500 mg  Status:  Discontinued        500 mg 100 mL/hr over 60 Minutes Intravenous Every 12 hours 07/15/23 1918 07/18/23 1234   07/15/23 2200  cefTRIAXone (ROCEPHIN) 2 g in sodium chloride 0.9 % 100 mL IVPB  Status:  Discontinued        2 g 200 mL/hr over 30 Minutes Intravenous Every 24 hours 07/15/23 1918 07/18/23 1234   07/15/23 1545  vancomycin (VANCOCIN) IVPB 1000 mg/200 mL premix       Placed in "And" Linked Group   1,000 mg 200 mL/hr over 60 Minutes Intravenous  Once 07/15/23 1533 07/15/23 1805   07/15/23 1545  vancomycin (VANCOCIN) IVPB 1000 mg/200 mL premix       Placed in "And" Linked Group   1,000 mg 200 mL/hr over  60 Minutes Intravenous  Once 07/15/23 1533 07/15/23 1708   07/15/23 1530  ceFEPIme (MAXIPIME) 2 g in sodium chloride 0.9 % 100 mL IVPB        2 g 200 mL/hr over 30 Minutes Intravenous  Once 07/15/23 1524 07/15/23 1643   07/15/23 1530  metroNIDAZOLE (FLAGYL) IVPB 500 mg        500 mg 100 mL/hr over 60 Minutes Intravenous  Once 07/15/23 1524 07/15/23 1708   07/15/23 1530  vancomycin (VANCOCIN) IVPB 1000 mg/200 mL premix  Status:  Discontinued        1,000 mg 200 mL/hr over 60 Minutes Intravenous  Once 07/15/23 1524 07/15/23 1531       Assessment/Plan: 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 Corliss Skains  - WOC RN consult for new ostomy - soft diet with supplements   WUJ:WJXB diet IVF per primary ID: rocephin/flagyl - can stop VTE: lovenox start  dispo once tol diet can be discharged from my standpoint  - Per primary -  T2DM HTN HLD Gout GERD RA - on plaquenil baseline, on prednisone Chronic pain - on long acting morphine bid at baseline Neuropathy - on gabapentin at baseline CKD with AKI Bronchiectasis/ILD/chronic resp failure -  on 3 lpm baseline     Emelia Loron 07/20/2023

## 2023-07-20 NOTE — Consult Note (Signed)
 WOC Nurse ostomy consult note Stoma type/location: Diverting loop colostomy on upper quadrant. Stomal assessment/size: red and viable on the skin level, edema. 32 mm x 38 mm Peristomal assessment: intact Treatment options for stomal/peristomal skin: bed nurse ask for help, the bag leaks several times. Applied 2" barrier [phone] Output 20 ml liquid bloody. Ostomy pouching: 2 pc. # [id number] and [id number].  As alternative: 1pc. #[id number]. Pt want to try the one piece pouch. Ordered supplies.  Education provided:  Provide educational ostomy care for the patient.   - The pouch need to be change twice per week or if is leaking. - Clean the skin with soup and water, or just water. Keep in mind that any product can stay at the skin before apply the next pouch to have a better seal.   - Cut the barrier with the size and shape as the ostomy has. In this case, oval 32 mm x 38 mm - Take the protect plastic off the barrier. That can be use as a model for the next pouch system. - The ostomy will change the size to a smaller one on the next weeks, keep measuring at least once a week to make sure if the size is correct. - Applied the ring surrounding the cut on the barrier.  - Apply on the skin, stretching a little the bottom of the ostomy site to apply correctly. - The warmed skin will interact with the barrier, making it sealed.   - To empty the pouch, need to be several times per day, when becomes 1/3 full. - Teach how to open and close the lock in roll system.  Enrolled patient in [employer] DC program: No  We will decide on the next visit is the 1 ou 2 system pouch will be better for her. She has arthritis on her right hand, difficulty the ostomy care.  WOC team will follow on THURSDAY.  Please reconsult if further assistance is needed. Thank-you,  Denyse Amass BSN, RN, [employer], WOC  (Pager: [phone])

## 2023-07-20 NOTE — Progress Notes (Signed)
 Report given to [place] LPN on 4W at this time

## 2023-07-20 NOTE — Discharge Summary (Signed)
 PATIENT DETAILS Name: [name] Age: 83 y.o. Sex: female Date of Birth: 1940/05/23 MRN: [id number]. Admitting Physician: [name], MD WUJ:[id number], [name], MD  Admit Date: 07/15/2023 Discharge date: 07/20/2023  Recommendations for Outpatient Follow-up:  Follow up with PCP in 1-2 weeks Please obtain CMP/CBC in one week Please ensure follow-up with surgery.  Admitted From:  Home  Disposition: CIR   Discharge Condition: good  CODE STATUS:   Code Status: Full Code   Diet recommendation:  Diet Order             DIET SOFT Fluid consistency: Thin  Diet effective now           Diet - low sodium heart healthy           Diet Carb Modified                    Brief Summary: Patient is a 83 y.o.  female with history of chronic hypoxic respiratory failure on home O2-3 L, RA on chronic steroids, VTE on anticoagulation, HTN, DM-2, chronic sigmoid stricture related to diverticular disease-presented with abdominal pain and diarrhea-which was felt to be due to symptomatic sigmoid stricture-after evaluation by cardiology/general surgery-patient underwent diverting ostomy.   Significant events: 2/26>> admit to [facility]   Significant studies: 1/27>> echo: EF 60-65%, grade 1 diastolic dysfunction. 2/26>> CT head: No acute intracranial abnormality 2/26>> CT abdomen/pelvis: Dilatation of colon-abrupt change in caliber noted involving the sigmoid colon-likely stricture.   Significant microbiology data: 2/26>> COVID/influenza/RSV PCR: Negative 2/26>> blood culture: No growth   Procedures: None   Consults: GI [facility]  Brief Hospital Course: Left lower quadrant abdominal pain-diarrhea due to symptomatic sigmoid colon stricture S/p diverting loop ostomy on 2/28 Diet being advanced-General Surgery following Mobilize with PT/OT At this point-not felt to have infectious diarrhea-has all antibiotics discontinued on 3/1 Discussed with general surgery-okay to be  discharged to CIR today.   Hypotension Secondary to hypovolemia and adrenal insufficiency/crisis (on chronic steroids) Managed with stress dose IV hydrocortisone/midodrine-with significant improvement. BP now stable-midodrine has been discontinued-hydrocortisone has been transition back to usual dosing of prednisone.   FOBT positive stools No active bleeding noted GI not recommending inpatient colonoscopy Hb stable-follow closely Outpatient GI follow plan posthospital discharge.   ILD secondary to RA Bronchiectasis Chronic hypoxic respiratory failure 3L at baseline Leflunomide/Plaquenil on hold and should be okay to resume in the next few days. No longer on stress dose of hydrocortisone-transitioned to prednisone. Resume prophylactic dose Zithromax.   HTN BP stable Continue midodrine Resume Benicar/hydralazine in the next few days as BP continues to rebound.  Run of SVT Now stable Beta-blocker.   HLD Statin   DM-2 (A1c 5.6 on 1/26) SSI-CBG stable.  History of VTE SQ Lovenox at prophylactic dose.   Mobilize as much as possible.   Hypokalemia Repleted.  Hypothyroidism Levothyroxine   Peripheral neuropathy Neurontin   Chronic pain syndrome On narcotics-MS Contin 15 mg every 12 hours-and as needed oxycodone.   Debility/deconditioning PT/OT-CIR on discharge   Class 2 Obesity/Underweight: Estimated body mass index is 35.42 kg/m as calculated from the following:   Height as of this encounter: 5\' 3"  (1.6 m).   Weight as of this encounter: 90.7 kg  Discharge Diagnoses:  Principal Problem:   GI bleed Active Problems:   Essential hypertension   Dyslipidemia   Rheumatoid arthritis (HCC)   Inability to walk   ILD (interstitial lung disease) (HCC)   Type 2 diabetes mellitus with  sensory neuropathy (HCC)   Morbid obesity (HCC)   Chronic respiratory failure with hypoxia (HCC)   Chronic heart failure with preserved ejection fraction (HFpEF) (HCC)   Gout    Depression   Chronic bilateral low back pain without sciatica   Acquired hypothyroidism   DVT (deep venous thrombosis) (HCC)   Chronic pain   CRI (chronic renal insufficiency), stage 3 (moderate) (HCC)   Bronchiectasis without complication (HCC)   Diabetic neuropathy (HCC)   History of pulmonary embolus (PE)   [name]   GERD without esophagitis   Abdominal pain   Discharge Instructions:  Activity:  As tolerated with Full fall precautions use walker/cane & assistance as needed   Discharge Instructions     Call MD for:  difficulty breathing, headache or visual disturbances   Complete by: As directed    Call MD for:  redness, tenderness, or signs of infection (pain, swelling, redness, odor or green/yellow discharge around incision site)   Complete by: As directed    Call MD for:  severe uncontrolled pain   Complete by: As directed    Diet - low sodium heart healthy   Complete by: As directed    Diet Carb Modified   Complete by: As directed    Discharge instructions   Complete by: As directed    Follow with Primary MD  [name], MD in 1-2 weeks  Please get a complete blood count and chemistry panel checked by your Primary MD at your next visit, and again as instructed by your Primary MD.  Get Medicines reviewed and adjusted: Please take all your medications with you for your next visit with your Primary MD  Laboratory/radiological data: Please request your Primary MD to go over all hospital tests and procedure/radiological results at the follow up, please ask your Primary MD to get all Hospital records sent to his/her office.  In some cases, they will be blood work, cultures and biopsy results pending at the time of your discharge. Please request that your primary care M.D. follows up on these results.  Also Note the following: If you experience worsening of your admission symptoms, develop shortness of breath, life  threatening emergency, suicidal or homicidal thoughts you must seek medical attention immediately by calling 911 or calling your MD immediately  if symptoms less severe.  You must read complete instructions/literature along with all the possible adverse reactions/side effects for all the Medicines you take and that have been prescribed to you. Take any new Medicines after you have completely understood and accpet all the possible adverse reactions/side effects.   Do not drive when taking Pain medications or sleeping medications (Benzodaizepines)  Do not take more than prescribed Pain, Sleep and Anxiety Medications. It is not advisable to combine anxiety,sleep and pain medications without talking with your primary care practitioner  Special Instructions: If you have smoked or chewed Tobacco  in the last 2 yrs please stop smoking, stop any regular Alcohol  and or any Recreational drug use.  Wear Seat belts while driving.  Please note: You were cared for by a hospitalist during your hospital stay. Once you are discharged, your primary care physician will handle any further medical issues. Please note that NO REFILLS for any discharge medications will be authorized once you are discharged, as it is imperative that you return to your primary care physician (or establish a relationship with a primary care physician if you do not have one) for your post  hospital discharge needs so that they can reassess your need for medications and monitor your lab values.   Increase activity slowly   Complete by: As directed       Allergies as of 07/20/2023       Reactions   Codeine Swelling, Other (See Comments)   Facial swelling, Chest pain, and swelling in legs   Infliximab Anaphylaxis   (REMICADE) "sent me into shock"   Lisinopril Swelling, Rash   Face and neck swelling   Ofev [nintedanib] Diarrhea        Medication List     STOP taking these medications    allopurinol 300 MG tablet Commonly known  as: ZYLOPRIM   colchicine 0.6 MG tablet   docusate sodium 100 MG capsule Commonly known as: COLACE   hydrALAZINE 25 MG tablet Commonly known as: APRESOLINE   olmesartan 40 MG tablet Commonly known as: BENICAR   Ozempic (0.25 or 0.5 MG/DOSE) 2 MG/1.5ML Sopn Generic drug: Semaglutide(0.25 or 0.5MG /DOS)       TAKE these medications    albuterol 108 (90 Base) MCG/ACT inhaler Commonly known as: VENTOLIN HFA Inhale 2 puffs into the lungs as needed for wheezing or shortness of breath.   atorvastatin 10 MG tablet Commonly known as: LIPITOR Take 1 tablet (10 mg total) by mouth daily.   azithromycin 250 MG tablet Commonly known as: ZITHROMAX Take 1 tablet (250 mg total) by mouth every Monday, Wednesday, and Friday. Take every Monday, Wednesday, Friday.   CALCIUM + D3 PO Take 2 tablets by mouth daily.   diclofenac Sodium 1 % Gel Commonly known as: VOLTAREN Apply 2 g topically 3 (three) times daily. Apply to both hands and knees What changed:  when to take this additional instructions   dicyclomine 20 MG tablet Commonly known as: BENTYL Take 1 tablet (20 mg total) by mouth 4 (four) times daily -  before meals and at bedtime.   diphenoxylate-atropine 2.5-0.025 MG tablet Commonly known as: LOMOTIL Take 1 tablet by mouth 4 (four) times daily as needed for diarrhea or loose stools.   DRY EYES OP Place 2 drops into both eyes as needed (dry eyes).   DULoxetine 60 MG capsule Commonly known as: CYMBALTA Take 1 capsule (60 mg total) by mouth daily.   enoxaparin 40 MG/0.4ML injection Commonly known as: LOVENOX Inject 0.4 mLs (40 mg total) into the skin daily. Start taking on: July 21, 2023   ferrous sulfate 324 MG Tbec Take 324 mg by mouth every evening.   fluticasone 50 MCG/ACT nasal spray Commonly known as: FLONASE Place 1 spray into both nostrils daily.   furosemide 20 MG tablet Commonly known as: LASIX Take 1 tablet (20 mg total) by mouth daily.   gabapentin  600 MG tablet Commonly known as: NEURONTIN Take 0.5 tablets (300 mg total) by mouth 2 (two) times daily.   guaiFENesin 600 MG 12 hr tablet Commonly known as: MUCINEX Take 2 tablets (1,200 mg total) by mouth 2 (two) times daily.   hydroxychloroquine 200 MG tablet Commonly known as: PLAQUENIL Take 1 tablet (200 mg total) by mouth daily. Start taking on: July 22, 2023 What changed: These instructions start on July 22, 2023. If you are unsure what to do until then, ask your doctor or other care provider.   leflunomide 20 MG tablet Commonly known as: ARAVA Take 1 tablet (20 mg total) by mouth daily. Start taking on: July 22, 2023 What changed: These instructions start on July 22, 2023. If you are  unsure what to do until then, ask your doctor or other care provider.   levothyroxine 50 MCG tablet Commonly known as: SYNTHROID Take 1 tablet (50 mcg total) by mouth daily.   lidocaine 5 % Commonly known as: LIDODERM Place 1 patch onto the skin daily. Remove & Discard patch within 12 hours or as directed by MD   Linzess 72 MCG capsule Generic drug: linaclotide Take 72 mcg by mouth daily before breakfast.   metolazone 2.5 MG tablet Commonly known as: ZAROXOLYN TAKE 1 TABLET BY MOUTH DAILY AS NEEDED FOR 2 DAYS FOR WEIGHT ABOVE 255 POUNDS   metoprolol succinate 50 MG 24 hr tablet Commonly known as: TOPROL-XL Take 1 tablet (50 mg total) by mouth daily. Take with or immediately following a meal.   morphine 15 MG 12 hr tablet Commonly known as: MS CONTIN Take 1 tablet (15 mg total) by mouth every 12 (twelve) hours. SNF use only   multivitamin with minerals Tabs tablet Take 1 tablet by mouth daily.   ondansetron 4 MG disintegrating tablet Commonly known as: ZOFRAN-ODT Take 1 tablet (4 mg total) by mouth every 8 (eight) hours as needed for nausea or vomiting.   oxyCODONE 5 MG immediate release tablet Commonly known as: Oxy IR/ROXICODONE Take 1 tablet (5 mg total) by mouth every 6  (six) hours as needed for moderate pain (pain score 4-6). SNF use only   OXYGEN Inhale 3 L into the lungs See admin instructions. 3 L at bedtime, and 3 L during the day as needed for exertion   pantoprazole 40 MG tablet Commonly known as: PROTONIX Take 1 tablet (40 mg total) by mouth 2 (two) times daily.   polyethylene glycol 17 g packet Commonly known as: MIRALAX / GLYCOLAX Take 17 g by mouth 2 (two) times daily. Hold for diarrhea   Potassium Chloride ER 20 MEQ Tbcr Please take 20 mg p.o. twice daily for first 3 days, then go back to 20 mg oral daily, monitor frequently and adjust as needed What changed:  how much to take how to take this when to take this   predniSONE 10 MG tablet Commonly known as: DELTASONE Take 1 tablet (10 mg total) by mouth daily with breakfast.   SALONPAS EX Apply 1 patch topically as needed (shoulder and leg pain).   simethicone 80 MG chewable tablet Commonly known as: MYLICON Chew 1 tablet (80 mg total) by mouth every 6 (six) hours as needed for flatulence.   Vitamin D3 50 MCG (2000 UT) capsule Take 2,000 Units by mouth daily.   zinc oxide 20 % ointment Apply 1 Application topically See admin instructions. Every shift. Apply to MASD to left buttocks and with each incontinent episode.        Allergies  Allergen Reactions   Codeine Swelling and Other (See Comments)    Facial swelling, Chest pain, and swelling in legs    Infliximab Anaphylaxis    (REMICADE) "sent me into shock"    Lisinopril Swelling and Rash    Face and neck swelling    Ofev [Nintedanib] Diarrhea     Other Procedures/Studies: DG Chest Port 1 View Result Date: 07/15/2023 CLINICAL DATA:  Weakness. EXAM: PORTABLE CHEST 1 VIEW COMPARISON:  Chest radiograph dated 07/01/2023. FINDINGS: The heart size and mediastinal contours are within normal limits. Streaky bibasilar opacities, slightly increased at the right lung base, with background chronic interstitial changes. No  sizable pleural effusion or pneumothorax. No acute osseous abnormality. IMPRESSION: Streaky bibasilar opacities, slightly increased on  the right, could reflect atelectasis or infiltrate superimposed on chronic interstitial changes. Electronically Signed   By: Hart Robinsons M.D.   On: 07/15/2023 16:36   CT ABDOMEN PELVIS W CONTRAST Result Date: 07/15/2023 CLINICAL DATA:  Left lower quadrant abdominal pain. EXAM: CT ABDOMEN AND PELVIS WITH CONTRAST TECHNIQUE: Multidetector CT imaging of the abdomen and pelvis was performed using the standard protocol following bolus administration of intravenous contrast. RADIATION DOSE REDUCTION: This exam was performed according to the departmental dose-optimization program which includes automated exposure control, adjustment of the mA and/or kV according to patient size and/or use of iterative reconstruction technique. CONTRAST:  75mL OMNIPAQUE IOHEXOL 350 MG/ML SOLN COMPARISON:  July 05, 2023. FINDINGS: Lower chest: Stable probable bibasilar scarring is noted. Hepatobiliary: No focal liver abnormality is seen. No gallstones, gallbladder wall thickening, or biliary dilatation. Pancreas: Unremarkable. No pancreatic ductal dilatation or surrounding inflammatory changes. Spleen: Normal in size without focal abnormality. Adrenals/Urinary Tract: Adrenal glands appear normal. Stable bilateral renal cysts are noted for which no further follow-up is required. Minimal nonobstructive right nephrolithiasis is noted. No hydronephrosis or renal obstruction is noted. Urinary bladder is decompressed. Stomach/Bowel: The stomach is unremarkable. No small bowel dilatation is noted. Stable colonic dilatation is noted. Mild to moderate wall thickening of proximal transverse colon is noted which may be due to lack of distension, but inflammatory or infectious colitis cannot be excluded. The appendix is not visualized. There is again noted abrupt caliber change involving the sigmoid colon  which was present on prior exam, stricture cannot be excluded. Vascular/Lymphatic: Aortic atherosclerosis. No enlarged abdominal or pelvic lymph nodes. Reproductive: Status post hysterectomy. No adnexal masses. Other: No ascites or hernia is noted. Musculoskeletal: No acute osseous abnormality is noted. IMPRESSION: Stable probable bibasilar scarring is noted in the visualized lung bases. Minimal nonobstructive right nephrolithiasis. Continued dilatation of the colon is noted in most portions, most consistent with ileus. Mild to moderate wall thickening of proximal transverse colon is noted which may be due to lack of distension, but inflammatory or infectious colitis cannot be excluded. There is again noted abrupt caliber change involving the sigmoid colon which was present on prior exam, and stricture cannot be excluded. Aortic Atherosclerosis (ICD10-I70.0). Electronically Signed   By: Lupita Raider M.D.   On: 07/15/2023 15:36   CT Head Wo Contrast Result Date: 07/15/2023 CLINICAL DATA:  Provided history: Weakness. EXAM: CT HEAD WITHOUT CONTRAST TECHNIQUE: Contiguous axial images were obtained from the base of the skull through the vertex without intravenous contrast. RADIATION DOSE REDUCTION: This exam was performed according to the departmental dose-optimization program which includes automated exposure control, adjustment of the mA and/or kV according to patient size and/or use of iterative reconstruction technique. COMPARISON:  Head CT 06/13/2023. FINDINGS: Brain: Cerebral atrophy. Patchy and ill-defined hypoattenuation within the cerebral white matter, nonspecific but compatible with moderate chronic small vessel ischemic disease. There is no acute intracranial hemorrhage. No demarcated cortical infarct. No extra-axial fluid collection. No evidence of an intracranial mass. No midline shift. Vascular: No hyperdense vessel. Atherosclerotic calcifications. Skull: No calvarial fracture or aggressive osseous  lesion. Sinuses/Orbits: No mass or acute finding within the imaged orbits. Minimal mucosal thickening within the bilateral ethmoid and right maxillary sinuses. Other: Small-volume fluid within left mastoid air cells. IMPRESSION: 1. No evidence of an acute intracranial abnormality. 2. Moderate chronic small vessel ischemic changes within the cerebral white matter. 3. Cerebral atrophy. 4. Small left mastoid effusion. Electronically Signed   By: Jackey Loge D.O.  On: 07/15/2023 14:46   CT ABDOMEN PELVIS W CONTRAST Result Date: 07/05/2023 CLINICAL DATA:  Right lower quadrant abdominal pain. EXAM: CT ABDOMEN AND PELVIS WITH CONTRAST TECHNIQUE: Multidetector CT imaging of the abdomen and pelvis was performed using the standard protocol following bolus administration of intravenous contrast. RADIATION DOSE REDUCTION: This exam was performed according to the departmental dose-optimization program which includes automated exposure control, adjustment of the mA and/or kV according to patient size and/or use of iterative reconstruction technique. CONTRAST:  OMNIPAQUE IOHEXOL 300 MG/ML  SOLN COMPARISON:  CT abdomen pelvis dated 06/30/2023. FINDINGS: Lower chest: There is bilateral bronchiectasis and reticulation. Hepatobiliary: No focal liver abnormality is seen. Layering density in the gallbladder may reflect sludge, small gallstones, or vicarious excretion of contrast. No gallbladder wall thickening or biliary dilatation. Pancreas: Unremarkable. No pancreatic ductal dilatation or surrounding inflammatory changes. Spleen: Normal in size without focal abnormality. Adrenals/Urinary Tract: Adrenal glands are unremarkable. Nonobstructive right renal calculi measure up to 4 mm in size. Bilateral renal cysts measure up to 6 cm on the right. No imaging follow-up is recommended for this finding. No hydronephrosis on either side. Bladder is unremarkable. Stomach/Bowel: Stomach is within normal limits. There is wall  thickening of the transverse, descending, and proximal sigmoid colon, similar to prior exam. No significant surrounding inflammatory changes. There is caliber change to decompressed distal sigmoid colon in the pelvis which appears more pronounced than on prior exam. The sigmoid colon is in close proximity to the left ovary and the bladder and there appears to be some degree of tethering. The appendix is not identified and may be surgically absent. Vascular/Lymphatic: Aortic atherosclerosis. No enlarged abdominal or pelvic lymph nodes. Reproductive: Status post hysterectomy. Other: No abdominal wall hernia or abnormality. No abdominopelvic ascites. Musculoskeletal: Severe degenerative changes are seen in the spine. Fixation hardware is seen in the proximal left femur. IMPRESSION: 1. Wall thickening of the transverse, descending, and proximal sigmoid colon, similar to prior exam. There is caliber change to decompressed distal sigmoid colon in the pelvis which appears more pronounced than on prior exam. There is apparent tethering of the sigmoid colon to the bladder and ovary which may result in some degree of constriction of the sigmoid colon. Aortic Atherosclerosis (ICD10-I70.0). Electronically Signed   By: Romona Curls M.D.   On: 07/05/2023 17:57   DG Chest 2 View Result Date: 07/01/2023 CLINICAL DATA:  [id number] Sepsis [facility]) [id number] EXAM: CHEST - 2 VIEW COMPARISON:  06/17/2023, 10/30/2021 FINDINGS: The heart size and mediastinal contours are within normal limits. Low lung volumes. Streaky bibasilar opacities, similar in appearance to the previous study. Upper lung zones are clear. No pleural effusion or pneumothorax. The visualized skeletal structures are unremarkable. IMPRESSION: Low lung volumes with streaky bibasilar opacities, similar in appearance to the previous study. Findings may represent atelectasis versus pneumonia. Electronically Signed   By: Duanne Guess D.O.   On: 07/01/2023 11:20   CT ABDOMEN  PELVIS W CONTRAST Result Date: 06/30/2023 CLINICAL DATA:  Abdomen pain left lower quadrant pain EXAM: CT ABDOMEN AND PELVIS WITH CONTRAST TECHNIQUE: Multidetector CT imaging of the abdomen and pelvis was performed using the standard protocol following bolus administration of intravenous contrast. RADIATION DOSE REDUCTION: This exam was performed according to the departmental dose-optimization program which includes automated exposure control, adjustment of the mA and/or kV according to patient size and/or use of iterative reconstruction technique. CONTRAST:  OMNIPAQUE IOHEXOL 300 MG/ML  SOLN COMPARISON:  Radiograph 06/26/2023, CT 12/15/2022, 03/01/2022 FINDINGS: Lower chest:  Lung bases demonstrate bronchial wall thickening and mild mucous plugging in the right lower lobe. Peribronchovascular hazy density could be inflammatory. Hepatobiliary: No focal liver abnormality is seen. No gallstones, gallbladder wall thickening, or biliary dilatation. Pancreas: Unremarkable. No pancreatic ductal dilatation or surrounding inflammatory changes. Spleen: Normal in size without focal abnormality. Adrenals/Urinary Tract: Adrenal glands are within normal limits. Kidneys show no hydronephrosis. Multiple renal cysts for which no imaging follow-up is recommended. Small nonobstructing right kidney stones. Decompressed urinary bladder with diffuse wall thickening Stomach/Bowel: Stomach nonenlarged. Thickened small bowel loops. In the right lower quadrant with mucosal enhancement. Fluid within the colon. Air distension of cecum. Wall thickening and mucosal enhancement of the: With some inflammation surrounding the descending colon. Caliber change of the colon in the pelvis in the region of the thickened bladder and left ovary is seen on prior exams, series 2, image 69 through 74. No frank gas within the urinary bladder at this time. Vascular/Lymphatic: Aortic atherosclerosis. No enlarged abdominal or pelvic lymph nodes.  Reproductive: Hysterectomy. Left ovary tethered to a loop of sigmoid colon and bladder. Other: Negative for pelvic effusion or free air. Musculoskeletal: Multilevel degenerative changes. No acute osseous abnormality IMPRESSION: 1. Wall thickening and mucosal enhancement of small bowel in the right lower quadrant suspicious for enteritis. Mild air distension of the colon with areas of wall thickening and mucosal enhancement consistent with colitis. Caliber change in the pelvis at the level of the distal sigmoid colon where there is tethered appearance of the sigmoid colon to a thickened bladder and the left ovary as seen on prior exams. Findings raise concern for possible colon stricture and mild upstream obstruction. Concern previously raised for colovesical fistula, no gas in the bladder on today's study. 2. Nonobstructing right kidney stones. 3. Bronchial wall thickening and mild mucous plugging in the right lower lobe with peribronchovascular hazy density which could be inflammatory. 4. Aortic atherosclerosis. Aortic Atherosclerosis (ICD10-I70.0). Electronically Signed   By: Jasmine Pang M.D.   On: 06/30/2023 19:19   DG Abd Portable 1V Result Date: 06/26/2023 CLINICAL DATA:  Follow-up ileus EXAM: PORTABLE ABDOMEN - 1 VIEW COMPARISON:  06/24/2023 FINDINGS: Scattered large and small bowel gas is noted. The degree of colonic distention has improved when compared with the prior exam. No free air is seen. No abnormal mass is noted. Postsurgical changes in the left hip are again seen. IMPRESSION: Decrease in the degree of gaseous distension of the colon. No free air is seen. Electronically Signed   By: Alcide Clever M.D.   On: 06/26/2023 11:33   DG Abd 1 View Result Date: 06/24/2023 CLINICAL DATA:  Ileus. EXAM: ABDOMEN - 1 VIEW COMPARISON:  Abdominal radiograph dated 06/23/2023. FINDINGS: Dilated small and large bowel as seen on the prior radiograph. No free air identified. Degenerative changes of the spine. No  acute osseous pathology. IMPRESSION: Persistent air distention of the small and large bowel. Electronically Signed   By: Elgie Collard M.D.   On: 06/24/2023 14:18   DG Abd Portable 1V Result Date: 06/23/2023 CLINICAL DATA:  83 year old female is constipated. EXAM: PORTABLE ABDOMEN - 1 VIEW COMPARISON:  06/22/2023 radiographs and earlier. FINDINGS: Portable AP supine view at 0752 hours. Gas Phil large and small bowel loops throughout the abdomen and pelvis redemonstrated, similar to multiple abdominal radiographs since 06/17/2023. No significant retained stool. Lung bases appear negative. Stable visualized osseous structures. Previous left femur ORIF. IMPRESSION: Ongoing gas distended small and large bowel loops. Differential considerations are ileus versus distal colonic  obstruction (less likely given rectal gas on 06/20/2023 comparison. No significant retained stool. Electronically Signed   By: Odessa Fleming M.D.   On: 06/23/2023 08:45   DG Abd Portable 1V Result Date: 06/22/2023 CLINICAL DATA:  Constipation EXAM: PORTABLE ABDOMEN - 1 VIEW COMPARISON:  Two days ago FINDINGS: Generalized gaseous distension of colon and to a lesser extent affecting small bowel. No abnormal stool retention. No concerning mass effect or gas collection. The largest segment of gas dilated bowel is measured at the cecum, 11 cm and previously 9.4 cm. No clear volvulus pattern. IMPRESSION: Unchanged pattern of generalized gaseous distension of bowel. Greatest distension at the cecum where there is 11 cm in diameter, mildly progressed. Provided history of constipation, which is not seen radiographically. Electronically Signed   By: Tiburcio Pea M.D.   On: 06/22/2023 10:37     TODAY-DAY OF DISCHARGE:  Subjective:   [name] today has no headache,no chest abdominal pain,no new weakness tingling or numbness, feels much better wants to go home today.  Objective:   Blood pressure 139/72, pulse 94, temperature 97.8 F (36.6  C), temperature source Oral, resp. rate 19, height 5\' 3"  (1.6 m), weight 90.7 kg, SpO2 98%.  Intake/Output Summary (Last 24 hours) at 07/20/2023 0941 Last data filed at 07/20/2023 0650 Gross per 24 hour  Intake 240 ml  Output 650 ml  Net -410 ml   Filed Weights   07/17/23 1303  Weight: 90.7 kg    Exam: Awake Alert, Oriented *3, No new F.N deficits, Normal affect [place].AT,PERRAL Supple Neck,No JVD, No cervical lymphadenopathy appriciated.  Symmetrical Chest wall movement, Good air movement bilaterally, CTAB RRR,No Gallops,Rubs or new Murmurs, No Parasternal Heave +ve B.Sounds, Abd Soft, Non tender, No organomegaly appriciated, No rebound -guarding or rigidity. No Cyanosis, Clubbing or edema, No new Rash or bruise   PERTINENT RADIOLOGIC STUDIES: No results found.   PERTINENT LAB RESULTS: CBC: Recent Labs    07/18/23 0534 07/19/23 0531  WBC 7.3 6.9  HGB 9.3* 9.9*  HCT 29.3* 31.2*  PLT 180 181   CMET CMP     Component Value Date/Time   NA 141 07/20/2023 0444   NA 145 (H) 09/29/2022 0921   NA 139 02/17/2012 1337   K 4.3 07/20/2023 0444   K 4.8 02/17/2012 1337   CL 109 07/20/2023 0444   CL 103 02/17/2012 1337   CO2 26 07/20/2023 0444   CO2 25 02/17/2012 1337   GLUCOSE 109 (H) 07/20/2023 0444   GLUCOSE 112 (H) 02/17/2012 1337   BUN 8 07/20/2023 0444   BUN 25 09/29/2022 0921   BUN 32.0 (H) 02/17/2012 1337   CREATININE 0.66 07/20/2023 0444   CREATININE 1.57 (H) 09/17/2016 1438   CREATININE 1.7 (H) 02/17/2012 1337   CALCIUM 7.8 (L) 07/20/2023 0444   CALCIUM 8.9 02/17/2012 1337   PROT 4.5 (L) 07/16/2023 0337   PROT 5.4 (L) 09/29/2022 0921   PROT 6.5 02/17/2012 1337   ALBUMIN 2.1 (L) 07/17/2023 0750   ALBUMIN 3.7 (L) 09/29/2022 0921   ALBUMIN 3.3 (L) 02/17/2012 1337   AST 15 07/16/2023 0337   AST 16 02/17/2012 1337   ALT 11 07/16/2023 0337   ALT 26 02/17/2012 1337   ALKPHOS 37 (L) 07/16/2023 0337   ALKPHOS 59 02/17/2012 1337   BILITOT 0.7 07/16/2023 0337    BILITOT 0.2 09/29/2022 0921   BILITOT 0.70 02/17/2012 1337   GFR 54.32 (L) 11/28/2022 1050   EGFR 61 09/29/2022 0921   GFRNONAA >60  07/20/2023 0444   GFRNONAA 32 (L) 09/17/2016 1438    GFR Estimated Creatinine Clearance: 58.9 mL/min (by C-G formula based on SCr of 0.66 mg/dL). No results for input(s): "LIPASE", "AMYLASE" in the last 72 hours. No results for input(s): "CKTOTAL", "CKMB", "CKMBINDEX", "TROPONINI" in the last 72 hours. Invalid input(s): "POCBNP" No results for input(s): "DDIMER" in the last 72 hours. No results for input(s): "HGBA1C" in the last 72 hours. No results for input(s): "CHOL", "HDL", "LDLCALC", "TRIG", "CHOLHDL", "LDLDIRECT" in the last 72 hours. No results for input(s): "TSH", "T4TOTAL", "T3FREE", "THYROIDAB" in the last 72 hours.  Invalid input(s): "FREET3" No results for input(s): "VITAMINB12", "FOLATE", "FERRITIN", "TIBC", "IRON", "RETICCTPCT" in the last 72 hours. Coags: No results for input(s): "INR" in the last 72 hours.  Invalid input(s): "PT" Microbiology: Recent Results (from the past 240 hours)  Culture, blood (routine x 2)     Status: None (Preliminary result)   Collection Time: 07/15/23  3:00 PM   Specimen: BLOOD  Result Value Ref Range Status   Specimen Description BLOOD LEFT ANTECUBITAL  Final   Special Requests   Final    BOTTLES DRAWN AEROBIC AND ANAEROBIC Blood Culture results may not be optimal due to an inadequate volume of blood received in culture bottles   Culture   Final    NO GROWTH 4 DAYS Performed at [facility], [address], [place], [place] [address]    Report Status PENDING  Incomplete  Culture, blood (routine x 2)     Status: None (Preliminary result)   Collection Time: 07/15/23  3:00 PM   Specimen: BLOOD  Result Value Ref Range Status   Specimen Description BLOOD RIGHT ANTECUBITAL  Final   Special Requests   Final    BOTTLES DRAWN AEROBIC AND ANAEROBIC Blood Culture results may not be optimal due to an  inadequate volume of blood received in culture bottles   Culture   Final    NO GROWTH 4 DAYS Performed at [facility], [address] [place], [place] [address]    Report Status PENDING  Incomplete  Resp panel by RT-PCR (RSV, Flu A&B, Covid) Anterior Nasal Swab     Status: None   Collection Time: 07/15/23  4:16 PM   Specimen: Anterior Nasal Swab  Result Value Ref Range Status   SARS Coronavirus 2 by RT PCR NEGATIVE NEGATIVE Final   Influenza A by PCR NEGATIVE NEGATIVE Final   Influenza B by PCR NEGATIVE NEGATIVE Final    Comment: (NOTE) The Xpert Xpress SARS-CoV-2/FLU/RSV plus assay is intended as an aid in the diagnosis of influenza from Nasopharyngeal swab specimens and should not be used as a sole basis for treatment. Nasal washings and aspirates are unacceptable for Xpert Xpress SARS-CoV-2/FLU/RSV testing.  Fact Sheet for Patients: BloggerCourse.com  Fact Sheet for Healthcare Providers: SeriousBroker.it  This test is not yet approved or cleared by the [place] FDA and has been authorized for detection and/or diagnosis of SARS-CoV-2 by FDA under an Emergency Use Authorization (EUA). This EUA will remain in effect (meaning this test can be used) for the duration of the COVID-19 declaration under Section 564(b)(1) of the Act, 21 U.S.C. section 360bbb-3(b)(1), unless the authorization is terminated or revoked.     Resp Syncytial Virus by PCR NEGATIVE NEGATIVE Final    Comment: (NOTE) Fact Sheet for Patients: BloggerCourse.com  Fact Sheet for Healthcare Providers: SeriousBroker.it  This test is not yet approved or cleared by the [place] and  has been authorized for detection and/or diagnosis of SARS-CoV-2 by FDA under an Emergency Use Authorization (EUA). This EUA will remain in effect (meaning this test can be used) for the duration of the COVID-19  declaration under Section 564(b)(1) of the Act, 21 U.S.C. section 360bbb-3(b)(1), unless the authorization is terminated or revoked.  Performed at [facility], [address], [place], [place] [address]     FURTHER DISCHARGE INSTRUCTIONS:  Get Medicines reviewed and adjusted: Please take all your medications with you for your next visit with your Primary MD  Laboratory/radiological data: Please request your Primary MD to go over all hospital tests and procedure/radiological results at the follow up, please ask your Primary MD to get all Hospital records sent to his/her office.  In some cases, they will be blood work, cultures and biopsy results pending at the time of your discharge. Please request that your primary care M.D. goes through all the records of your hospital data and follows up on these results.  Also Note the following: If you experience worsening of your admission symptoms, develop shortness of breath, life threatening emergency, suicidal or homicidal thoughts you must seek medical attention immediately by calling 911 or calling your MD immediately  if symptoms less severe.  You must read complete instructions/literature along with all the possible adverse reactions/side effects for all the Medicines you take and that have been prescribed to you. Take any new Medicines after you have completely understood and accpet all the possible adverse reactions/side effects.   Do not drive when taking Pain medications or sleeping medications (Benzodaizepines)  Do not take more than prescribed Pain, Sleep and Anxiety Medications. It is not advisable to combine anxiety,sleep and pain medications without talking with your primary care practitioner  Special Instructions: If you have smoked or chewed Tobacco  in the last 2 yrs please stop smoking, stop any regular Alcohol  and or any Recreational drug use.  Wear Seat belts while driving.  Please note: You were cared for by a  hospitalist during your hospital stay. Once you are discharged, your primary care physician will handle any further medical issues. Please note that NO REFILLS for any discharge medications will be authorized once you are discharged, as it is imperative that you return to your primary care physician (or establish a relationship with a primary care physician if you do not have one) for your post hospital discharge needs so that they can reassess your need for medications and monitor your lab values.  Total Time spent coordinating discharge including counseling, education and face to face time equals greater than 30 minutes.  SignedJeoffrey Massed 07/20/2023 9:41 AM

## 2023-07-20 NOTE — Consult Note (Signed)
 [facility] Liaison Consult Note   07/20/2023  Danicia LUWANNA BROSSMAN 1940-09-22 [id number]  Insurance: Medicare ACO REACH  Primary Care Provider: [name], MD with [facility] [place], this provider is listed for the transition of care follow up appointments  and [facility] calls   [facility] Liaison met patient at bedside at [facility].  Patient up in recliner. Daughter at bedside. Explained VBCI follow up.  Endorses PCP and insurance.    The patient was screened for 30 day readmission hospitalization with noted extreme risk score for unplanned readmission risk 3 hospital admissions in 6 months.  The patient was assessed for potential [facility] Coordination service needs for post hospital transition for care coordination. Review of patient's electronic medical record reveals patient is to transition to [facility].   Plan: [facility] Liaison will continue to follow progress and disposition to asess for post hospital community care coordination/management needs.  Referral request for community care coordination: NONE currently, will follow disposition from CIR when appropriate.   VBCI Community Care, Population Health does not replace or interfere with any arrangements made by the Inpatient Transition of Care team.   For questions contact:   Charlesetta Shanks, RN, BSN, CCM [place]  [facility] Liaison Direct Dial: [phone] or secure chat Email: [email or web address]

## 2023-07-21 DIAGNOSIS — R5381 Other malaise: Secondary | ICD-10-CM | POA: Diagnosis not present

## 2023-07-21 LAB — CBC WITH DIFFERENTIAL/PLATELET
Abs Immature Granulocytes: 0.1 10*3/uL — ABNORMAL HIGH (ref 0.00–0.07)
Basophils Absolute: 0 10*3/uL (ref 0.0–0.1)
Basophils Relative: 0 %
Eosinophils Absolute: 0.1 10*3/uL (ref 0.0–0.5)
Eosinophils Relative: 2 %
HCT: 30.8 % — ABNORMAL LOW (ref 36.0–46.0)
Hemoglobin: 9.7 g/dL — ABNORMAL LOW (ref 12.0–15.0)
Immature Granulocytes: 1 %
Lymphocytes Relative: 32 %
Lymphs Abs: 2.3 10*3/uL (ref 0.7–4.0)
MCH: 30 pg (ref 26.0–34.0)
MCHC: 31.5 g/dL (ref 30.0–36.0)
MCV: 95.4 fL (ref 80.0–100.0)
Monocytes Absolute: 0.6 10*3/uL (ref 0.1–1.0)
Monocytes Relative: 9 %
Neutro Abs: 4.1 10*3/uL (ref 1.7–7.7)
Neutrophils Relative %: 56 %
Platelets: 158 10*3/uL (ref 150–400)
RBC: 3.23 MIL/uL — ABNORMAL LOW (ref 3.87–5.11)
RDW: 16.1 % — ABNORMAL HIGH (ref 11.5–15.5)
WBC: 7.3 10*3/uL (ref 4.0–10.5)
nRBC: 0.3 % — ABNORMAL HIGH (ref 0.0–0.2)

## 2023-07-21 LAB — GLUCOSE, CAPILLARY
Glucose-Capillary: 72 mg/dL (ref 70–99)
Glucose-Capillary: 91 mg/dL (ref 70–99)
Glucose-Capillary: 164 mg/dL — ABNORMAL HIGH (ref 70–99)
Glucose-Capillary: 119 mg/dL — ABNORMAL HIGH (ref 70–99)

## 2023-07-21 LAB — COMPREHENSIVE METABOLIC PANEL
ALT: 12 U/L (ref 0–44)
AST: 18 U/L (ref 15–41)
Albumin: 2.2 g/dL — ABNORMAL LOW (ref 3.5–5.0)
Alkaline Phosphatase: 51 U/L (ref 38–126)
Anion gap: 9 (ref 5–15)
BUN: 7 mg/dL — ABNORMAL LOW (ref 8–23)
CO2: 25 mmol/L (ref 22–32)
Calcium: 7.8 mg/dL — ABNORMAL LOW (ref 8.9–10.3)
Chloride: 109 mmol/L (ref 98–111)
Creatinine, Ser: 0.56 mg/dL (ref 0.44–1.00)
GFR, Estimated: >60 mL/min (ref >60)
Glucose, Bld: 96 mg/dL (ref 70–99)
Potassium: 3.3 mmol/L — ABNORMAL LOW (ref 3.5–5.1)
Sodium: 143 mmol/L (ref 135–145)
Total Bilirubin: 0.4 mg/dL (ref 0.0–1.2)
Total Protein: 4.1 g/dL — ABNORMAL LOW (ref 6.5–8.1)

## 2023-07-21 MED ORDER — MORPHINE SULFATE 15 MG PO TABS
15.0000 mg | ORAL_TABLET | Freq: Two times a day (BID) | ORAL | Status: DC | PRN
  Administered 2023-07-21 – 2023-07-29 (×6): 15 mg via ORAL
  Filled 2023-07-21 (×7): qty 1

## 2023-07-21 MED ORDER — WITCH HAZEL-GLYCERIN EX PADS: MEDICATED_PAD | CUTANEOUS | Status: DC | PRN

## 2023-07-21 NOTE — Discharge Instructions (Signed)
 Inpatient Rehab Discharge Instructions  [name] Discharge date and time: No discharge date for patient encounter.   Activities/Precautions/ Functional Status: Activity: activity as tolerated Diet: Soft Wound Care: Routine skin checks/colostomy care Functional status:  ___ No restrictions     ___ Walk up steps independently ___ 24/7 supervision/assistance   ___ Walk up steps with assistance ___ Intermittent supervision/assistance  ___ Bathe/dress independently ___ Walk with walker     _x__ Bathe/dress with assistance ___ Walk Independently    ___ Shower independently ___ Walk with assistance    ___ Shower with assistance ___ No alcohol     ___ Return to work/school ________  Special Instructions: No driving smoking or alcohol   My questions have been answered and I understand these instructions. I will adhere to these goals and the provided educational materials after my discharge from the hospital.  Patient/Caregiver Signature _______________________________ Date __________  Clinician Signature _______________________________________ Date __________  Please bring this form and your medication list with you to all your follow-up doctor's appointments.

## 2023-07-21 NOTE — Plan of Care (Signed)
  Problem: RH Balance Goal: LTG Patient will maintain dynamic standing with ADLs (OT) Description: LTG:  Patient will maintain dynamic standing balance with assist during activities of daily living (OT)  Flowsheets (Taken 07/21/2023 1213) LTG: Pt will maintain dynamic standing balance during ADLs with: Supervision/Verbal cueing   Problem: Sit to Stand Goal: LTG:  Patient will perform sit to stand in prep for activites of daily living with assistance level (OT) Description: LTG:  Patient will perform sit to stand in prep for activites of daily living with assistance level (OT) Flowsheets (Taken 07/21/2023 1213) LTG: PT will perform sit to stand in prep for activites of daily living with assistance level: Supervision/Verbal cueing   Problem: RH Bathing Goal: LTG Patient will bathe all body parts with assist levels (OT) Description: LTG: Patient will bathe all body parts with assist levels (OT) Flowsheets (Taken 07/21/2023 1213) LTG: Pt will perform bathing with assistance level/cueing: Supervision/Verbal cueing   Problem: RH Dressing Goal: LTG Patient will perform upper body dressing (OT) Description: LTG Patient will perform upper body dressing with assist, with/without cues (OT). Flowsheets (Taken 07/21/2023 1213) LTG: Pt will perform upper body dressing with assistance level of: Set up assist Goal: LTG Patient will perform lower body dressing w/assist (OT) Description: LTG: Patient will perform lower body dressing with assist, with/without cues in positioning using equipment (OT) Flowsheets (Taken 07/21/2023 1213) LTG: Pt will perform lower body dressing with assistance level of: Supervision/Verbal cueing   Problem: RH Toileting Goal: LTG Patient will perform toileting task (3/3 steps) with assistance level (OT) Description: LTG: Patient will perform toileting task (3/3 steps) with assistance level (OT)  Flowsheets (Taken 07/21/2023 1213) LTG: Pt will perform toileting task (3/3 steps) with  assistance level: Supervision/Verbal cueing   Problem: RH Toilet Transfers Goal: LTG Patient will perform toilet transfers w/assist (OT) Description: LTG: Patient will perform toilet transfers with assist, with/without cues using equipment (OT) Flowsheets (Taken 07/21/2023 1213) LTG: Pt will perform toilet transfers with assistance level of: Supervision/Verbal cueing   Problem: RH Tub/Shower Transfers Goal: LTG Patient will perform tub/shower transfers w/assist (OT) Description: LTG: Patient will perform tub/shower transfers with assist, with/without cues using equipment (OT) Flowsheets (Taken 07/21/2023 1213) LTG: Pt will perform tub/shower stall transfers with assistance level of: Contact Guard/Touching assist

## 2023-07-21 NOTE — Progress Notes (Signed)
 Inpatient Rehabilitation  Patient information reviewed and entered into eRehab system by Cheri Rous, OTR/L, Rehab Quality Coordinator.   Information including medical coding, functional ability and quality indicators will be reviewed and updated through discharge.

## 2023-07-21 NOTE — Progress Notes (Signed)
 Patient ID: [name], female   DOB: 05-12-1941, 83 y.o.   MRN: [id number] Met with the patient to review medical situation, rehab process, team conference and plan of care. Patient noted she was exhausted after the first therapy session with a little pain in left shoulder. Reported slept ok, just did not have much reserve, sedentary/sick for the past 4 months PTA. Reviewed WOC following colostomy care and trouble managing ostomy with RA and shoulder pain.  Reviewed medications, timing of meds and dietary modification recommendations. Continue to follow along to address educational needs to facilitate preparation for discharge. Pamelia Hoit

## 2023-07-21 NOTE — Progress Notes (Signed)
 PROGRESS NOTE   Subjective/Complaints: Tucks ordered for hemorrhoidal discomfort Patient used Morphine immediate release at home and felt this was more helpful than the oxycodone  ROD: +chronic pain   Objective:   No results found. Recent Labs    07/19/23 0531 07/20/23 1817  WBC 6.9 7.6  HGB 9.9* 10.7*  HCT 31.2* 34.9*  PLT 181 170   Recent Labs    07/19/23 0531 07/20/23 0444 07/20/23 1817  NA 140 141  --   K 2.7* 4.3  --   CL 106 109  --   CO2 23 26  --   GLUCOSE 101* 109*  --   BUN 8 8  --   CREATININE 0.60 0.66 0.61  CALCIUM 7.7* 7.8*  --     Intake/Output Summary (Last 24 hours) at 07/21/2023 0959 Last data filed at 07/21/2023 0453 Gross per 24 hour  Intake --  Output 200 ml  Net -200 ml        Physical Exam: Vital Signs Blood pressure (!) 157/96, pulse 98, temperature 97.9 F (36.6 C), temperature source Oral, resp. rate 17, SpO2 100%. Gen: no distress, normal appearing HEENT: oral mucosa pink and moist, NCAT Cardio: Reg rate Chest: normal effort, normal rate of breathing Abd: soft, non-distended Ext: no edema Psych: pleasant, normal affect Skin: intact Mental Status: AAOx3, memory grossly intact, fund of knowledge appropriate Speech/Languate: Follows commands CRANIAL NERVES: Cranial nerves II through XII grossly intact     MOTOR: RUE: 3/5 Deltoid, 4/5 Biceps, 4/5 Triceps,4/5 Grip LUE: 3/5 Deltoid, 4/5 Biceps, 4/5 Triceps, 4/5 Grip RLE: HF 4-/5, KE 4/5, ADF 4/5, APF 4/5 LLE: HF 4-/5, KE 4/5, ADF 4/5, APF 4/5   SENSORY: Normal to touch all 4 extremities   Coordination: Normal finger to nose, no tremor, no dysmetria  Assessment/Plan: 1. Functional deficits which require 3+ hours per day of interdisciplinary therapy in a comprehensive inpatient rehab setting. Physiatrist is providing close team supervision and 24 hour management of active medical problems listed below. Physiatrist and  rehab team continue to assess barriers to discharge/monitor patient progress toward functional and medical goals  Care Tool:  Bathing              Bathing assist       Upper Body Dressing/Undressing Upper body dressing        Upper body assist      Lower Body Dressing/Undressing Lower body dressing            Lower body assist       Toileting Toileting    Toileting assist       Transfers Chair/bed transfer  Transfers assist  Chair/bed transfer activity did not occur: Safety/medical concerns (fatigue, pain)        Locomotion Ambulation   Ambulation assist   Ambulation activity did not occur: Safety/medical concerns (fatigue, pain)          Walk 10 feet activity   Assist  Walk 10 feet activity did not occur: Safety/medical concerns (fatigue, pain)        Walk 50 feet activity   Assist Walk 50 feet with 2 turns activity did not occur: Safety/medical concerns (fatigue, pain)  Walk 150 feet activity   Assist Walk 150 feet activity did not occur: Safety/medical concerns (fatigue, pain)         Walk 10 feet on uneven surface  activity   Assist Walk 10 feet on uneven surfaces activity did not occur: Safety/medical concerns (fatigue, pain)         Wheelchair     Assist Is the patient using a wheelchair?: Yes Type of Wheelchair: Manual Wheelchair activity did not occur: Safety/medical concerns (fatigue, pain)         Wheelchair 50 feet with 2 turns activity    Assist    Wheelchair 50 feet with 2 turns activity did not occur: Safety/medical concerns (fatigue, pain)       Wheelchair 150 feet activity     Assist  Wheelchair 150 feet activity did not occur: Safety/medical concerns (fatigue, pain)       Blood pressure (!) 157/96, pulse 98, temperature 97.9 F (36.6 C), temperature source Oral, resp. rate 17, SpO2 100%.  Medical Problem List and Plan: 1. Functional deficits secondary to septic  shock/debility due to symptomatic chronic sigmoid colon stricture.  Status post diverting loop ostomy 2/28             -patient may shower if ostomy covered             -ELOS/Goals: 12-14, Min A to sup PT/OT             -Grounds pass ordered 2.  Antithrombotics: -DVT/anticoagulation:  Pharmaceutical: Lovenox             -antiplatelet therapy: N/A 3. Pain Management/chronic pain: Neurontin 300 mg twice daily, Voltaren gel 2 g 3 times daily, Lidoderm patch as directed, MS Contin 15 mg every 12 hours, oxycodone changed to MRIR BID prn, Robaxin as needed  4. Mood/Behavior/Sleep: Cymbalta 60 mg daily             -antipsychotic agents: N/A 5. Neuropsych/cognition: This patient is capable of making decisions on her own behalf. 6. Skin/Wound Care: Routine skin checks 7. Fluids/Electrolytes/Nutrition: Routine in and outs with follow-up chemistries             Hypokalemia- Recheck labs 8.  Hypotension.  Resolved.  Midodrine discontinued.  Toprol-XL 50 mg daily.  Home Benicar/hydralazine on hold resumed as needed.    9.  Rheumatoid arthritis.  Chronic prednisone resume.Leflunomide/Plaquenil currently on hold- recommended discussion of resumption with her outpatient PCP to minimize immune suppression post-operatively  10.  Hyperlipidemia.  Lipitor 11.  Diabetes mellitus type 2 with peripheral neuropathy.  Hemoglobin A1c 5.6.  SSI.              -CBGs well controlled 12.  Hypothyroidism.  Synthroid 13.  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  Chronic oxygen.  3 L at baseline prior to admission.  Prophylactic Zithromax resumed 14.  Chronic diastolic congestive heart failure.  Monitor for any signs of fluid overload. Daily weights 15.  CKD stage III.  Follow-up chemistries. Last Cr 0.66 3/3 16.  Iron deficiency anemia.  Follow-up CBC 17.  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  Status post gamma knife 10/13.  Follow-up outpatient 18.  Obesity.  BMI 35.42.   Dietary follow-up 19. HLD. Continue stain  20. Hemorrhoid: tucks ordered    LOS: 1 days A FACE TO FACE EVALUATION WAS PERFORMED  [name] P [name] 07/21/2023, 9:59 AM

## 2023-07-21 NOTE — Evaluation (Signed)
 Occupational Therapy Assessment and Plan  Patient Details  Name: ANAGHA LOSEKE MRN: [id number] Date of Birth: 1940-08-05  OT Diagnosis: acute pain, cognitive deficits, lumbago (low back pain), muscle weakness (generalized), and pain in joint Rehab Potential: Rehab Potential (ACUTE ONLY): Good ELOS: 2 weeks   Today's Date: 07/21/2023 OT Individual Time: 0950-1100 OT Individual Time Calculation (min): 70 min     Hospital Problem: Principal Problem:   Debility Active Problems:   Hypothyroidism   Septic shock (HCC)   Type 2 diabetes mellitus with diabetic polyneuropathy, without long-term current use of insulin (HCC)   Iron deficiency anemia   Hyperlipidemia   Chronic diastolic congestive heart failure (HCC)   Past Medical History:  Past Medical History:  Diagnosis Date   Acute encephalopathy 03/30/2021   11/12 - 04/03/2021 acute encephalopathy in the context of altered mental status and poor oral intake following increasing pain medicines and associated Klebsiella UTI.     Acute kidney injury superimposed on chronic kidney disease (HCC) 04/17/2015   11/12 - 04/03/2021 AKI with creatinine of 2.22 and GFR 22 indicating stage IV renal disease in the context of encephalopathy secondary to increased pain medications with decreased oral intake of fluids and nutrition.     Acute on chronic diastolic (congestive) heart failure (HCC)    Anemia    iron deficiency anemia - secondary to blood loss ( chronic)    Anxiety    Arthritis    endstage changes bilateral knees/bilateral ankles.    Asthma    Carotid artery occlusion    Chest pain of uncertain etiology 01/28/2012   Normal coronaries at cath     Chronic fatigue    Chronic kidney disease    Closed intertrochanteric fracture of hip, left, initial encounter (HCC) 11/29/2016   Closed left hip fracture (HCC)    Closed nondisplaced intertrochanteric fracture of left femur with delayed healing 01/14/2017   Clotting disorder (HCC)    pt  denies this   Contusion of left knee    due to fall 1/14.   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     Morbid obesity (HCC)    Neuromuscular disorder (HCC)    diabetic neuropathy    Normal coronary arteries    cardiac catheterization performed  10/31/14   Pneumonia    RA (rheumatoid arthritis) (HCC)    has been off methotreaxte since 10/13.   Septic shock (HCC) 06/13/2023   Spinal stenosis of lumbar region    Thyroid disease    Unspecified lack of coordination    URI (upper respiratory infection)    UTI (urinary tract infection) 03/31/2021   11/12 - 04/03/2021 Klebsiella UTI associated with encephalopathy in the context of AKI.  Status post IV ceftriaxone followed by oral Keflex x48 hours.     Past Surgical History:  Past Surgical History:  Procedure Laterality Date   BRAIN  SURGERY     Gamma knife 10/13. Needs repeat spring  '14   CARDIAC CATHETERIZATION N/A 10/31/2014   Procedure: Right/Left Heart Cath and Coronary Angiography;  Surgeon: Corky Crafts, MD;  Location: [facility] INVASIVE CV LAB;  Service: Cardiovascular;  Laterality: N/A;   COLONOSCOPY WITH PROPOFOL N/A 04/14/2022   Procedure: COLONOSCOPY WITH PROPOFOL;  Surgeon: Beverley Fiedler, MD;  Location: [facility] ENDOSCOPY;  Service: Gastroenterology;  Laterality: N/A;   CYST EXCISION     2 on face   CYST REMOVAL NECK     ESOPHAGOGASTRODUODENOSCOPY (EGD) WITH PROPOFOL N/A 09/14/2014   Procedure: ESOPHAGOGASTRODUODENOSCOPY (EGD)  WITH PROPOFOL;  Surgeon: Louis Meckel, MD;  Location: [facility] ENDOSCOPY;  Service: Endoscopy;  Laterality: N/A;   INCISION AND DRAINAGE HIP Left 01/16/2017   Procedure: IRRIGATION AND DEBRIDEMENT LEFT HIP;  Surgeon: Kathryne Hitch, MD;  Location: [facility] ORS;  Service: Orthopedics;  Laterality: Left;   INTRAMEDULLARY (IM) NAIL INTERTROCHANTERIC Left 11/29/2016   Procedure: INTRAMEDULLARY (IM) NAIL INTERTROCHANTRIC;  Surgeon: Kathryne Hitch, MD;  Location: [facility] OR;  Service: Orthopedics;  Laterality: Left;   LAPAROSCOPIC LOOP COLOSTOMY N/A 07/17/2023   Procedure: LAPAROSCOPIC DIVERTING LOOP COLOSTOMY;  Surgeon: Manus Rudd, MD;  Location: [facility] OR;  Service: General;  Laterality: N/A;   OVARY SURGERY     RIGHT HEART CATH N/A 08/12/2021   Procedure: RIGHT HEART CATH;  Surgeon: Runell Gess, MD;  Location[facility] INVASIVE CV LAB;  Service: Cardiovascular;  Laterality: N/A;   SHOULDER SURGERY Left    TONSILLECTOMY  age 26   VAGINAL HYSTERECTOMY     VIDEO BRONCHOSCOPY Bilateral 05/31/2013   Procedure: VIDEO BRONCHOSCOPY WITHOUT FLUORO;  Surgeon: Kalman Shan, MD;  Location: [facility] ENDOSCOPY;  Service: Cardiopulmonary;  Laterality: Bilateral;   video bronscoscopy  05/19/1998   lung    Assessment & Plan Clinical Impression: Ezma Rehm is a 83 year old right-handed female with history significant for chronic sigmoid stricture related to diverticular disease, hypertension, hyperlipidemia, diabetes mellitus with peripheral neuropathy, hypothyroidism, CKD stage III, gout, ILD, chronic respiratory failure/COPD on home oxygen with 3 L at baseline, chronic diastolic congestive heart failure, rheumatoid arthritis with chronic steroids, history of GI bleed, DVT/PE, chronic pain maintained on MS Contin 15 mg every 12 hours as well as oxycodone as needed, iron deficiency anemia, obesity with BMI 35.42, 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  Per chart review patient lives with spouse and son. She has an aide Monday through Friday 9-1. Two-level home bed and bath main level with one-step to entry. Modified independent for ADLs and rollator for mobility. Patient with recent admission for sepsis 06/30/2023 - 07/10/2023 as well as viral respiratory illness and discharged for short time to skilled nursing facility as well as recent admission 06/13/2023 - 06/26/2023 for septic shock secondary to flu A/RSV pneumonia/UTI. Presented 07/15/2023 with abdominal pain and diarrhea with black stools as well as hypotension. She was given IV fluids with improvement in blood pressure. Admission chemistries unremarkable except WBC 11,900, hemoglobin 11.8, potassium 3.4, CO2 21, creatinine 1.09, lipase of 20, lactic acid 3.1, blood cultures no growth to date, urinalysis negative nitrite. Guaiac positive and noted low-grade fever. Code sepsis was initiated. Cranial CT scan negative. CT of the abdomen pelvis showed dilatation of colon-abrupt change in caliber noted involving the sigmoid colon-likely stricture. General surgery consulted for large bowel obstruction secondary to distal sigmoid stenosis undergoing laparoscopic assisted diverting loop colostomy 07/17/2023 per Dr.Tsuei. Initially maintained on broad-spectrum  antibiotics. Blood pressure remained monitored initially managed with stress dose IV hydrocortisone/midodrine with significant improvement and midodrine has been discontinued transition hydrocortisone to prednisone. Patient's hemoglobin and hematocrit remained stable. Rheumatoid arthritis currently with Plaquenil/Leflunomide on hold. She reports chronic joint pain particularly in her shoulders, hands and feet. She did not sleep well yesterday due to headache. She says prednisone sometimes contributes to her headaches. Patient's prednisone has been resumed. Headache has resolved today. She did have a run of SVT and Toprol has been adjusted. Subcutaneous Lovenox for  DVT prophylaxis. She is currently on a soft diet advanced as tolerated. Therapy evaluations completed due to patient's decreased functional mobility was admitted for a comprehensive rehab program. Patient transferred to CIR on 07/20/2023 .    Patient currently requires max with basic self-care skills secondary to muscle weakness, decreased cardiorespiratoy endurance and decreased oxygen support, decreased coordination, decreased awareness, decreased problem solving, decreased safety awareness, and decreased memory, and decreased standing balance.  Prior to hospitalization, patient could complete self-care with mod I.  Patient will benefit from skilled intervention to decrease level of assist with basic self-care skills and increase independence with basic self-care skills prior to discharge home with care partner.  Anticipate patient will require 24 hour supervision and follow up home health.  OT - End of Session Activity Tolerance: Tolerates < 10 min activity, no significant change in vital signs Endurance Deficit: Yes Endurance Deficit Description: dizziness with positional changes and heavy breathing OT Assessment Rehab Potential (ACUTE ONLY): Good OT Barriers to Discharge: Home environment access/layout;Incontinence;Weight OT Patient demonstrates impairments in the following area(s): Balance;Cognition;Edema;Motor;Endurance;Pain;Skin Integrity;Safety OT Basic ADL's Functional Problem(s): Bathing;Toileting;Dressing;Grooming OT Transfers Functional Problem(s): Toilet;Tub/Shower OT Additional Impairment(s): Fuctional Use of Upper Extremity OT Plan OT Intensity: Minimum of 1-2 x/day, 45 to 90 minutes OT Frequency: 5 out of 7 days OT Duration/Estimated Length of Stay: 2 weeks OT Treatment/Interventions: Balance/vestibular training;Discharge planning;Pain management;Self Care/advanced ADL retraining;UE/LE Coordination activities;Cognitive remediation/compensation;Disease  mangement/prevention;Functional mobility training;Patient/family education;Skin care/wound managment;Therapeutic Exercise;DME/adaptive equipment instruction;Neuromuscular re-education;Wheelchair propulsion/positioning;UE/LE Strength taining/ROM;Therapeutic Activities OT Self Feeding Anticipated Outcome(s): Independent OT Basic Self-Care Anticipated Outcome(s): Supervision OT Toileting Anticipated Outcome(s): Supervision OT Bathroom Transfers Anticipated Outcome(s): Supervision/CGA OT Recommendation Recommendations for Other Services: Therapeutic Recreation consult Therapeutic Recreation Interventions: Pet therapy Patient destination: Home Follow Up Recommendations: Home health OT;24 hour supervision/assistance Equipment Recommended: To be determined   OT Evaluation Precautions/Restrictions  Precautions Precautions: Fall Precaution/Restrictions Comments: ostomy, monitor O2 and HR Restrictions Weight Bearing Restrictions Per Provider Order: No Home Living/Prior Functioning Home Living Living Arrangements: Spouse/significant other Available Help at Discharge: Family, Available 24 hours/day (prior to admission pt had aide Mon-Fri from 9-1. Aide would assist with cooking, cleaning, and laundry. Son works 3rd shift and husband limited with physical assist 2/2 age) Type of Home: House Home Access: Stairs to enter, Level entry (has 1 STE to front and garage door but back door is level entry with sliding dor) Entrance Stairs-Number of Steps: 1 Entrance Stairs-Rails: None Home Layout: Two level, Able to live on main level with bedroom/bathroom (son lives upstairs) Alternate Level Stairs-Number of Steps: flight Bathroom Shower/Tub: Walk-in shower, Curtain, Other (comment) Bathroom Toilet: Handicapped height Bathroom Accessibility: Yes Additional Comments: Has shower chair and grab bars in shower. Has rollator, thinks she has RW in garage, transport chair, [facility]  Lives With: Spouse, Son IADL  History Homemaking Responsibilities: No Leisure and Hobbies: Reading, computer, listening to christian music Prior Function Level of Independence: Requires assistive device for independence, Independent with basic ADLs  Able to Take  Stairs?: No Driving: No Vision Baseline Vision/History: 1 Wears glasses Ability to See in Adequate Light: 0 Adequate Patient Visual Report: No change from baseline Vision Assessment?: No apparent visual deficits Perception  Perception: Within Functional Limits Praxis Praxis: WFL Cognition Cognition Overall Cognitive Status: Within Functional Limits for tasks assessed Arousal/Alertness: Awake/alert Orientation Level: Person;Place;Situation Person: Oriented Place: Oriented Situation: Oriented Memory: Impaired Awareness: Impaired Problem Solving: Impaired Safety/Judgment: Appears intact Brief Interview for Mental Status (BIMS) Repetition of Three Words (First Attempt): 3 Temporal Orientation: Year: Correct Temporal Orientation: Month: Accurate within 5 days Temporal Orientation: Day: Correct Recall: "Sock": No, could not recall Recall: "Blue": Yes, no cue required Recall: "Bed": No, could not recall BIMS Summary Score: 11 Sensation Sensation Light Touch: Impaired by gross assessment Hot/Cold: Not tested Proprioception: Appears Intact Stereognosis: Not tested Additional Comments: hx of neuropathy and RA in joints. Pt reports numbness and tingling in BLEs. Decreased light touch on LLE>RLE Coordination Gross Motor Movements are Fluid and Coordinated: No Fine Motor Movements are Fluid and Coordinated: Yes Coordination and Movement Description: global weakness/deconditioning, peripheral neuropathy Finger Nose Finger Test: WFL bilaterally Motor  Motor Motor: Abnormal postural alignment and control Motor - Skilled Clinical Observations: global weakness/deconditioning, peripheral neuropathy  Trunk/Postural Assessment  Cervical  Assessment Cervical Assessment: Exceptions to [facility] (forward head) Thoracic Assessment Thoracic Assessment: Exceptions to [facility] (kyphosis) Lumbar Assessment Lumbar Assessment: Exceptions to [facility] (posterior pelvic tilt) Postural Control Postural Control: Deficits on evaluation Protective Responses: delayed  Balance Balance Balance Assessed: Yes Static Sitting Balance Static Sitting - Balance Support: Feet supported;Bilateral upper extremity supported Static Sitting - Level of Assistance: 5: Stand by assistance (supervision) Dynamic Sitting Balance Dynamic Sitting - Balance Support: Feet supported;No upper extremity supported Dynamic Sitting - Level of Assistance: 5: Stand by assistance (supervision) Static Standing Balance Static Standing - Balance Support: Bilateral upper extremity supported;During functional activity (rollator) Static Standing - Level of Assistance: 4: Min assist Extremity/Trunk Assessment RUE Assessment RUE Assessment: Exceptions to [facility] Active Range of Motion (AROM) Comments: WFL General Strength Comments: 4-/5 grossly LUE Assessment LUE Assessment: Exceptions to [facility] Active Range of Motion (AROM) Comments: shoulder flexion limited to 35 degrees 2/2 RA related pain, WFL distally General Strength Comments: 2+/5 proximally, 4-/5 distally  Care Tool Care Tool Self Care Eating   Eating Assist Level: Set up assist    Oral Care    Oral Care Assist Level: Supervision/Verbal cueing    Bathing   Body parts bathed by patient: Left arm;Chest;Abdomen;Face Body parts bathed by helper: Right arm;Front perineal area;Buttocks;Right upper leg;Left upper leg;Right lower leg;Left lower leg   Assist Level: Maximal Assistance - Patient 24 - 49%    Upper Body Dressing(including orthotics)   What is the patient wearing?: Pull over shirt   Assist Level: Moderate Assistance - Patient 50 - 74%    Lower Body Dressing (excluding footwear)   What is the patient wearing?:  Incontinence brief;Pants Assist for lower body dressing: Total Assistance - Patient < 25%    Putting on/Taking off footwear   What is the patient wearing?: Non-skid slipper socks Assist for footwear: Dependent - Patient 0%       Care Tool Toileting Toileting activity   Assist for toileting: Total Assistance - Patient < 25%     Care Tool Bed Mobility Roll left and right activity   Roll left and right assist level: Moderate Assistance - Patient 50 - 74%    Sit to lying activity   Sit to lying assist level: Maximal Assistance -  Patient 25 - 49%    Lying to sitting on side of bed activity   Lying to sitting on side of bed assist level: the ability to move from lying on the back to sitting on the side of the bed with no back support.: Moderate Assistance - Patient 50 - 74%     Care Tool Transfers Sit to stand transfer   Sit to stand assist level: Moderate Assistance - Patient 50 - 74%    Chair/bed transfer Chair/bed transfer activity did not occur: Safety/medical concerns (fatigue, pain) Chair/bed transfer assist level: Minimal Assistance - Patient > 75%     Toilet transfer Toilet transfer activity did not occur: Safety/medical concerns (fatigue, pain) Assist Level: Minimal Assistance - Patient > 75%     Care Tool Cognition  Expression of Ideas and Wants Expression of Ideas and Wants: 4. Without difficulty (complex and basic) - expresses complex messages without difficulty and with speech that is clear and easy to understand  Understanding Verbal and Non-Verbal Content Understanding Verbal and Non-Verbal Content: 3. Usually understands - understands most conversations, but misses some part/intent of message. Requires cues at times to understand   Memory/Recall Ability Memory/Recall Ability : Current season;That he or she is in a hospital/hospital unit;Staff names and faces   Refer to Care Plan for Long Term Goals  SHORT TERM GOAL WEEK 1 OT Short Term Goal 1 (Week 1): Pt will  complete sit > stand in prep for ADL with CGA using LRAD OT Short Term Goal 2 (Week 1): Pt will complete toilet transfer with CGA using LRAD OT Short Term Goal 3 (Week 1): Pt will complete 1/3 toileting steps with CGA for balance OT Short Term Goal 4 (Week 1): Pt will thread LB clothing with supervision using AE PRN  Recommendations for other services: Therapeutic Recreation  Pet therapy   Skilled Therapeutic Intervention Patient received upright in bed upon therapy arrival and agreeable to participate in OT evaluation. Education provided on OT purpose, therapy schedule, goals for therapy, and safety policy while in rehab. L shoulder and low back pain reported with mobility; pre-medicated. OT offered rest breaks, repositioning for pain reduction.  Patient demonstrates significant deconditioning, as well as core, dynamic standing balance, functional endurance, GM coordination, LUE AROM deficits resulting in difficulty completing BADL tasks without increased physical assist. Cues needed throughout for problem solving, sequencing and awareness for safety. Pt will benefit from skilled OT services to focus on mentioned deficits. See below for ADL and functional transfer performance. ADLs completed at bed level, EOB and mod A sit > stand and min A stand pivot transfer with RW > w/c, however pt with completely flexed trunk with elbows on RW, with inability to stand upright due to reported baseline low back pain. Pt remained seated in w/c at conclusion of session with belt alarm on and all needs met at end of session.  Vitals BP 134/88 (sitting EOB) with dizziness reported; 3L O2 with 98% sat   ADL ADL Eating: Set up Where Assessed-Eating: Wheelchair Grooming: Supervision/safety Where Assessed-Grooming: Sitting at sink Upper Body Bathing: Minimal assistance Where Assessed-Upper Body Bathing: Edge of bed Lower Body Bathing: Maximal assistance Where Assessed-Lower Body Bathing: Bed level Upper Body  Dressing: Moderate assistance Where Assessed-Upper Body Dressing: Edge of bed Lower Body Dressing: Dependent Where Assessed-Lower Body Dressing: Bed level Toileting: Dependent Where Assessed-Toileting: Bed level Toilet Transfer: Minimal assistance Toilet Transfer Method: Stand pivot Toilet Transfer Equipment: Other (comment);Bedside commode (RW) Tub/Shower Transfer: Unable to assess Tub/Shower  Transfer Method: Unable to assess Film/video editor: Unable to assess [employer] Method: Unable to assess Mobility  Bed Mobility Bed Mobility: Rolling Right;Rolling Left;Sit to Supine;Supine to Sit Rolling Right: Moderate Assistance - Patient 50-74% Rolling Left: Moderate Assistance - Patient 50-74% Supine to Sit: Moderate Assistance - Patient 50-74% Sit to Supine: Maximal Assistance - Patient 25-49% Transfers Sit to Stand: Moderate Assistance - Patient 50-74% Stand to Sit: Contact Guard/Touching assist   Discharge Criteria: Patient will be discharged from OT if patient refuses treatment 3 consecutive times without medical reason, if treatment goals not met, if there is a change in medical status, if patient makes no progress towards goals or if patient is discharged from hospital.  The above assessment, treatment plan, treatment alternatives and goals were discussed and mutually agreed upon: by patient  Melvyn Novas, MS, OTR/L  07/21/2023, 12:21 PM

## 2023-07-21 NOTE — Evaluation (Signed)
 Physical Therapy Assessment and Plan  Patient Details  Name: [name] MRN: [id number] Date of Birth: 02-18-1941  PT Diagnosis: Abnormal posture, Abnormality of gait, Difficulty walking, Edema, Impaired sensation, Muscle spasms, Muscle weakness, and Pain in all joints from RA Rehab Potential: Good ELOS: 2 weeks   Today's Date: 07/21/2023 PT Individual Time: 4098-1191 PT Individual Time Calculation (min): 69 min    Hospital Problem: Principal Problem:   Debility Active Problems:   Hypothyroidism   Septic shock (HCC)   Type 2 diabetes mellitus with diabetic polyneuropathy, without long-term current use of insulin (HCC)   Iron deficiency anemia   Hyperlipidemia   Chronic diastolic congestive heart failure (HCC)   Past Medical History:  Past Medical History:  Diagnosis Date   Acute encephalopathy 03/30/2021   11/12 - 04/03/2021 acute encephalopathy in the context of altered mental status and poor oral intake following increasing pain medicines and associated Klebsiella UTI.     Acute kidney injury superimposed on chronic kidney disease (HCC) 04/17/2015   11/12 - 04/03/2021 AKI with creatinine of 2.22 and GFR 22 indicating stage IV renal disease in the context of encephalopathy secondary to increased pain medications with decreased oral intake of fluids and nutrition.     Acute on chronic diastolic (congestive) heart failure (HCC)    Anemia    iron deficiency anemia - secondary to blood loss ( chronic)    Anxiety    Arthritis    endstage changes bilateral knees/bilateral ankles.    Asthma    Carotid artery occlusion    Chest pain of uncertain etiology 01/28/2012   Normal coronaries at cath     Chronic fatigue    Chronic kidney disease    Closed intertrochanteric fracture of hip, left, initial encounter (HCC) 11/29/2016   Closed left hip fracture (HCC)    Closed nondisplaced intertrochanteric fracture of left femur with delayed healing 01/14/2017   Clotting disorder  (HCC)    pt denies this   Contusion of left knee    due to fall 1/14.   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     Morbid obesity (HCC)    Neuromuscular disorder (HCC)    diabetic neuropathy    Normal coronary arteries    cardiac catheterization performed  10/31/14   Pneumonia    RA (rheumatoid arthritis) (HCC)    has been off methotreaxte since 10/13.   Septic shock (HCC) 06/13/2023   Spinal stenosis of lumbar region    Thyroid disease    Unspecified lack of coordination    URI (upper respiratory infection)    UTI (urinary tract infection) 03/31/2021   11/12 - 04/03/2021 Klebsiella UTI associated with encephalopathy in the context of AKI.  Status post IV ceftriaxone followed by oral Keflex x48 hours.     Past Surgical History:  Past Surgical History:  Procedure Laterality Date  BRAIN SURGERY     Gamma knife 10/13. Needs repeat spring  '14   CARDIAC CATHETERIZATION N/A 10/31/2014   Procedure: Right/Left Heart Cath and Coronary Angiography;  Surgeon: Corky Crafts, MD;  Location: [facility] INVASIVE CV LAB;  Service: Cardiovascular;  Laterality: N/A;   COLONOSCOPY WITH PROPOFOL N/A 04/14/2022   Procedure: COLONOSCOPY WITH PROPOFOL;  Surgeon: Beverley Fiedler, MD;  Location: [facility] ENDOSCOPY;  Service: Gastroenterology;  Laterality: N/A;   CYST EXCISION     2 on face   CYST REMOVAL NECK     ESOPHAGOGASTRODUODENOSCOPY (EGD) WITH PROPOFOL N/A 09/14/2014   Procedure:  ESOPHAGOGASTRODUODENOSCOPY (EGD) WITH PROPOFOL;  Surgeon: Louis Meckel, MD;  Location: [facility] ENDOSCOPY;  Service: Endoscopy;  Laterality: N/A;   INCISION AND DRAINAGE HIP Left 01/16/2017   Procedure: IRRIGATION AND DEBRIDEMENT LEFT HIP;  Surgeon: Kathryne Hitch, MD;  Location: [facility] ORS;  Service: Orthopedics;  Laterality: Left;   INTRAMEDULLARY (IM) NAIL INTERTROCHANTERIC Left 11/29/2016   Procedure: INTRAMEDULLARY (IM) NAIL INTERTROCHANTRIC;  Surgeon: Kathryne Hitch, MD;  Location: [facility] OR;  Service: Orthopedics;  Laterality: Left;   LAPAROSCOPIC LOOP COLOSTOMY N/A 07/17/2023   Procedure: LAPAROSCOPIC DIVERTING LOOP COLOSTOMY;  Surgeon: Manus Rudd, MD;  Location: [facility] OR;  Service: General;  Laterality: N/A;   OVARY SURGERY     RIGHT HEART CATH N/A 08/12/2021   Procedure: RIGHT HEART CATH;  Surgeon: Runell Gess, MD;  Location: [facility] INVASIVE CV LAB;  Service: Cardiovascular;  Laterality: N/A;   SHOULDER SURGERY Left    TONSILLECTOMY  age 36   VAGINAL HYSTERECTOMY     VIDEO BRONCHOSCOPY Bilateral 05/31/2013   Procedure: VIDEO BRONCHOSCOPY WITHOUT FLUORO;  Surgeon: Kalman Shan, MD;  Location: [facility] ENDOSCOPY;  Service: Cardiopulmonary;  Laterality: Bilateral;   video bronscoscopy  05/19/1998   lung    Assessment & Plan Clinical Impression: Patient is a 83 y.o. year old female with history significant for chronic sigmoid stricture related to diverticular disease, hypertension, hyperlipidemia, diabetes mellitus with peripheral neuropathy, hypothyroidism, CKD stage III, gout, ILD, chronic respiratory failure/COPD on home oxygen with 3 L at baseline, chronic diastolic congestive heart failure, rheumatoid arthritis with chronic steroids, history of GI bleed, DVT/PE, chronic pain maintained on MS Contin 15 mg every 12 hours as well as oxycodone as needed, iron deficiency anemia, obesity with BMI 35.42, meningioma of the left sphenoid wing involving cavernous sinus status post gamma  knife 10/13. Per chart review patient lives with spouse and son. She has an aide Monday through Friday 9-1. Two-level home bed and bath main level with one-step to entry. Modified independent for ADLs and rollator for mobility. Patient with recent admission for sepsis 06/30/2023 - 07/10/2023 as well as viral respiratory illness and discharged for short time to skilled nursing facility as well as recent admission 06/13/2023 - 06/26/2023 for septic shock secondary to flu A/RSV pneumonia/UTI. Presented 07/15/2023 with abdominal pain and diarrhea with black stools as well as hypotension. She was given IV fluids with improvement in blood pressure. Admission chemistries unremarkable except WBC 11,900, hemoglobin 11.8, potassium 3.4, CO2 21, creatinine 1.09, lipase of 20, lactic acid 3.1, blood cultures no growth to date, urinalysis negative nitrite. Guaiac positive and noted low-grade fever. Code sepsis was initiated. Cranial CT scan negative. CT of the abdomen pelvis showed dilatation of colon-abrupt change in caliber noted involving the sigmoid colon-likely stricture. General surgery consulted for large bowel obstruction secondary to distal sigmoid stenosis undergoing laparoscopic assisted diverting loop colostomy 07/17/2023 per Dr.Tsuei. Initially maintained on  broad-spectrum antibiotics. Blood pressure remained monitored initially managed with stress dose IV hydrocortisone/midodrine with significant improvement and midodrine has been discontinued transition hydrocortisone to prednisone. Patient's hemoglobin and hematocrit remained stable. Rheumatoid arthritis currently with Plaquenil/Leflunomide on hold. She reports chronic joint pain particularly in her shoulders, hands and feet. She did not sleep well yesterday due to headache. She says prednisone sometimes contributes to her headaches. Patient's prednisone has been resumed. Headache has resolved today. She did have a run of SVT and Toprol has been adjusted. Subcutaneous  Lovenox for DVT prophylaxis. She is currently on a soft diet advanced as tolerated. Therapy evaluations completed due to patient's decreased functional mobility was admitted for a comprehensive rehab program   Patient currently requires mod with mobility secondary to muscle weakness, decreased cardiorespiratoy endurance and decreased oxygen support, and decreased standing balance, decreased postural control, and decreased balance strategies.  Prior to hospitalization, patient was modified independent  with mobility and lived with Spouse, Son in a House home.  Home access is 1Stairs to enter, Level entry (has 1 STE to front and garage door but back door is level entry with sliding dor).  Patient will benefit from skilled PT intervention to maximize safe functional mobility, minimize fall risk, and decrease caregiver burden for planned discharge home with 24 hour supervision.  Anticipate patient will benefit from follow up HH at discharge.  PT - End of Session Activity Tolerance: Tolerates 30+ min activity with multiple rests Endurance Deficit: Yes Endurance Deficit Description: pt required frequent rest breaks during session PT Assessment Rehab Potential (ACUTE/IP ONLY): Good PT Barriers to Discharge: Decreased caregiver support;Weight;Other (comments) PT Barriers to Discharge Comments: ostomy bag, global weakness/deconditioning, spouse is unable to physically assist. PT Patient demonstrates impairments in the following area(s): Balance;Edema;Endurance;Motor;Pain;Nutrition;Sensory;Skin Integrity PT Transfers Functional Problem(s): Bed Mobility;Bed to Chair;Car;Furniture PT Locomotion Functional Problem(s): Ambulation;Wheelchair Mobility;Stairs PT Plan PT Intensity: Minimum of 1-2 x/day ,45 to 90 minutes PT Frequency: 5 out of 7 days PT Duration Estimated Length of Stay: 2 weeks PT Treatment/Interventions: Ambulation/gait training;Discharge planning;Functional mobility training;Psychosocial  support;Therapeutic Activities;Balance/vestibular training;Disease management/prevention;Neuromuscular re-education;Skin care/wound management;Therapeutic Exercise;Wheelchair propulsion/positioning;Cognitive remediation/compensation;DME/adaptive equipment instruction;Pain management;Splinting/orthotics;UE/LE Strength taining/ROM;Community reintegration;Functional electrical stimulation;Patient/family education;Stair training;UE/LE Coordination activities PT Transfers Anticipated Outcome(s): supervision with LRAD PT Locomotion Anticipated Outcome(s): supervision with LRAD PT Recommendation Follow Up Recommendations: Home health PT Patient destination: Home Equipment Recommended: To be determined   PT Evaluation Precautions/Restrictions Precautions Precautions: Fall Precaution/Restrictions Comments: ostomy, monitor O2 and HR Restrictions Weight Bearing Restrictions Per Provider Order: No Pain Interference Pain Interference Pain Effect on Sleep: 4. Almost constantly Pain Interference with Therapy Activities: 2. Occasionally Pain Interference with Day-to-Day Activities: 2. Occasionally Home Living/Prior Functioning Home Living Available Help at Discharge: Family;Available 24 hours/day (prior to admission pt had aide Mon-Fri from 9-1. Aide would assist with cooking, cleaning, and laundry. Son works 3rd shift and husband limited with physical assist 2/2 age) Type of Home: House Home Access: Stairs to enter;Level entry (has 1 STE to front and garage door but back door is level entry with sliding dor) Entrance Stairs-Number of Steps: 1 Entrance Stairs-Rails: None Home Layout: Two level;Able to live on main level with bedroom/bathroom (son lives upstairs) Alternate Level Stairs-Number of Steps: flight Bathroom Shower/Tub: Walk-in shower;Curtain;Other (comment) Bathroom Toilet: Handicapped height Bathroom Accessibility: Yes Additional Comments: Has shower chair and grab bars in shower. Has  rollator, thinks she has RW in garage, transport chair, [facility]  Lives With: Spouse;Son Prior Function Level of Independence: Requires assistive device for independence;Independent with basic ADLs  Able to Take Stairs?: No Driving: No Vision/Perception  Vision - History Ability to See in Adequate Light: 0 Adequate Perception Perception: Within Functional Limits Praxis Praxis: WFL  Cognition Overall Cognitive Status: Within Functional Limits for tasks assessed Arousal/Alertness: Awake/alert Orientation Level: Oriented X4 Memory: Impaired Awareness: Impaired Problem Solving: Impaired Safety/Judgment: Appears intact Sensation Sensation Light Touch: Impaired by gross assessment Hot/Cold: Not tested Proprioception: Appears Intact Stereognosis: Not tested Additional Comments: hx of neuropathy and RA in joints. Pt reports numbness and tingling in BLEs. Decreased light touch on LLE>RLE Coordination Gross Motor Movements are Fluid and Coordinated: No Fine Motor Movements are Fluid and Coordinated: Yes Coordination and Movement Description: global weakness/deconditioning, peripheral neuropathy Finger Nose Finger Test: WFL bilaterally Motor  Motor Motor: Abnormal postural alignment and control Motor - Skilled Clinical Observations: global weakness/deconditioning, peripheral neuropathy  Trunk/Postural Assessment  Cervical Assessment Cervical Assessment: Exceptions to [facility] (forward head) Thoracic Assessment Thoracic Assessment: Exceptions to [facility] (kyphosis) Lumbar Assessment Lumbar Assessment: Exceptions to [facility] (posterior pelvic tilt) Postural Control Postural Control: Deficits on evaluation Protective Responses: delayed  Balance Balance Balance Assessed: Yes Static Sitting Balance Static Sitting - Balance Support: Feet supported;Bilateral upper extremity supported Static Sitting - Level of Assistance: 5: Stand by assistance (supervision) Dynamic Sitting Balance Dynamic Sitting -  Balance Support: Feet supported;No upper extremity supported Dynamic Sitting - Level of Assistance: 5: Stand by assistance (supervision) Static Standing Balance Static Standing - Balance Support: Bilateral upper extremity supported;During functional activity (rollator) Static Standing - Level of Assistance: 4: Min assist Extremity Assessment  RLE Assessment RLE Assessment: Not tested General Strength Comments: grossly 3+/5 LLE Assessment LLE Assessment: Not tested General Strength Comments: grossly 3+/5  Care Tool Care Tool Bed Mobility Roll left and right activity   Roll left and right assist level: Moderate Assistance - Patient 50 - 74%    Sit to lying activity   Sit to lying assist level: Maximal Assistance - Patient 25 - 49%    Lying to sitting on side of bed activity   Lying to sitting on side of bed assist level: the ability to move from lying on the back to sitting on the side of the bed with no back support.: Moderate Assistance - Patient 50 - 74%     Care Tool Transfers Sit to stand transfer   Sit to stand assist level: Moderate Assistance - Patient 50 - 74%    Chair/bed transfer Chair/bed transfer activity did not occur: Safety/medical concerns (fatigue, pain) Chair/bed transfer assist level: Minimal Assistance - Patient > 75%    Car transfer Car transfer activity did not occur: Safety/medical concerns (fatigue, pain)        Care Tool Locomotion Ambulation Ambulation activity did not occur: Safety/medical concerns (fatigue, pain)        Walk 10 feet activity Walk 10 feet activity did not occur: Safety/medical concerns (fatigue, pain)       Walk 50 feet with 2 turns activity Walk 50 feet with 2 turns activity did not occur: Safety/medical concerns (fatigue, pain)      Walk 150 feet activity Walk 150 feet activity did not occur: Safety/medical concerns (fatigue, pain)      Walk 10 feet on uneven surfaces activity Walk 10 feet on uneven surfaces activity did  not occur: Safety/medical concerns (fatigue, pain)      Stairs Stair activity did not occur: Safety/medical concerns (fatigue, pain)        Walk up/down 1 step activity Walk up/down 1 step or curb (drop  down) activity did not occur: Safety/medical concerns (fatigue, pain)      Walk up/down 4 steps activity Walk up/down 4 steps activity did not occur: Safety/medical concerns (fatigue, pain)      Walk up/down 12 steps activity Walk up/down 12 steps activity did not occur: Safety/medical concerns (fatigue, pain)      Pick up small objects from floor Pick up small object from the floor (from standing position) activity did not occur: Safety/medical concerns (fatigue, pain)      Wheelchair Is the patient using a wheelchair?: Yes Type of Wheelchair: Manual Wheelchair activity did not occur: Safety/medical concerns (fatigue, pain)      Wheel 50 feet with 2 turns activity Wheelchair 50 feet with 2 turns activity did not occur: Safety/medical concerns (fatigue, pain)    Wheel 150 feet activity Wheelchair 150 feet activity did not occur: Safety/medical concerns (fatigue, pain)      Refer to Care Plan for Long Term Goals  SHORT TERM GOAL WEEK 1 PT Short Term Goal 1 (Week 1): pt will perform bed mobility with min A consistantly PT Short Term Goal 2 (Week 1): pt will transfer bed<>chair with LRAD and CGA PT Short Term Goal 3 (Week 1): pt will ambulate 50ft with LRAD and CGA  Recommendations for other services: None   Skilled Therapeutic Intervention Evaluation completed (see details above and below) with education on PT POC and goals and individual treatment initiated with focus on functional mobility/transfers, generalized strengthening and endurance, and dynamic standing balance/coordination. Pt on 3L O2 with SPO2 96% and HR 110bpm at rest. Received pt semi-reclined in bed, pt educated on PT evaluation, CIR policies, and therapy schedule and agreeable. Pt reported pain 5/10 in L shoulder  from RA - RN notified and present to administer medications. Pt reported not doing well in the mornings and requesting time for medication to take effect.   Provided pt with 22x18 manual WC with bilateral elevating legrests, bariatric rollator, and oxygen tank. Donned RA gloves and pt transferred semi-reclined<>sitting R EOB with HOB elevated and use of bedrails and mod A for BLE management and trunk control - with increased time. Pt sat EOB and took medicine and required extensive rest break. Stood from elevated EOB with bari rollator and and heavy min/light mod A x 2 trials - pt demonstrating poor rollator safety awareness, attempting to pull up on front frame of rollator and grabbing onto seat and frame for support rather than handles. Pt unable to come completely upright. Returned to sitting EOB, then transferred into supine with max A for BLE management and required +2 to scoot to [facility]. Concluded session with pt semi-reclined in bed, needs within reach, and bed alarm on. Safety plan updated.   Mobility Bed Mobility Bed Mobility: Rolling Right;Rolling Left;Sit to Supine;Supine to Sit Rolling Right: Moderate Assistance - Patient 50-74% Rolling Left: Moderate Assistance - Patient 50-74% Supine to Sit: Moderate Assistance - Patient 50-74% Sit to Supine: Maximal Assistance - Patient 25-49% Transfers Transfers: Sit to Stand;Stand to Sit;Stand Pivot Transfers Sit to Stand: Moderate Assistance - Patient 50-74% Stand to Sit: Contact Guard/Touching assist Transfer (Assistive device): Rollator Locomotion  Gait Ambulation: No Gait Gait: No Stairs / Additional Locomotion Stairs: No Wheelchair Mobility Wheelchair Mobility: No   Discharge Criteria: Patient will be discharged from PT if patient refuses treatment 3 consecutive times without medical reason, if treatment goals not met, if there is a change in medical status, if patient makes no progress towards goals or if patient is  discharged from  hospital.  The above assessment, treatment plan, treatment alternatives and goals were discussed and mutually agreed upon: by patient  Huntley Dec PT, DPT 07/21/2023, 12:18 PM

## 2023-07-21 NOTE — Discharge Summary (Addendum)
 Physician Discharge Summary  Patient ID: [name] MRN: [id number] DOB/AGE: 24-Feb-1941 83 y.o.  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 EXAM: PORTABLE CHEST 1 VIEW COMPARISON:  Chest radiograph dated 07/01/2023. FINDINGS: The heart size and mediastinal contours are within normal limits. Streaky bibasilar opacities, slightly increased at the right lung base, with background chronic interstitial changes. No sizable pleural effusion or pneumothorax. No acute osseous abnormality. IMPRESSION: Streaky bibasilar opacities, slightly increased on the right, could reflect atelectasis or infiltrate superimposed on chronic interstitial changes. Electronically Signed   By: Hart Robinsons M.D.   On: 07/15/2023 16:36   CT ABDOMEN PELVIS W CONTRAST Result Date: 07/15/2023 CLINICAL DATA:  Left lower quadrant abdominal pain. EXAM: CT ABDOMEN AND PELVIS WITH CONTRAST TECHNIQUE: Multidetector CT imaging of the abdomen and pelvis was performed using the standard protocol following bolus administration of intravenous contrast. RADIATION DOSE REDUCTION: This exam was performed according to the departmental dose-optimization program which includes automated exposure control, adjustment of the mA and/or kV according to patient size and/or use of iterative reconstruction  technique. CONTRAST:  75mL OMNIPAQUE IOHEXOL 350 MG/ML SOLN COMPARISON:  July 05, 2023. FINDINGS: Lower chest: Stable probable bibasilar scarring is noted. Hepatobiliary: No focal liver abnormality is seen. No gallstones, gallbladder wall thickening, or biliary dilatation. Pancreas: Unremarkable. No pancreatic ductal dilatation or surrounding inflammatory changes. Spleen: Normal in size without focal abnormality. Adrenals/Urinary Tract: Adrenal glands appear normal. Stable bilateral renal cysts are noted for which no further follow-up is required. Minimal nonobstructive right nephrolithiasis is noted. No hydronephrosis or renal obstruction is noted. Urinary bladder is decompressed. Stomach/Bowel: The stomach is unremarkable. No small bowel dilatation is noted. Stable colonic dilatation is noted. Mild to moderate wall thickening of proximal transverse colon is noted which may be due to lack of distension, but inflammatory or infectious colitis cannot be excluded. The appendix is not visualized. There is again noted abrupt caliber change involving the sigmoid colon which was present on prior exam, stricture cannot be excluded. Vascular/Lymphatic: Aortic atherosclerosis. No enlarged abdominal or pelvic lymph nodes. Reproductive: Status post hysterectomy. No adnexal masses. Other: No ascites or hernia is noted. Musculoskeletal: No acute osseous abnormality is noted. IMPRESSION: Stable probable bibasilar scarring is noted in the visualized lung bases. Minimal nonobstructive right nephrolithiasis. Continued dilatation of the colon is noted in most portions, most consistent with ileus. Mild to moderate wall thickening of proximal transverse colon is noted which may be due to lack of distension, but inflammatory or infectious colitis cannot be excluded. There is again noted abrupt caliber change involving the sigmoid colon which was present on prior exam, and stricture cannot be excluded. Aortic Atherosclerosis  (ICD10-I70.0). Electronically Signed   By: Lupita Raider M.D.   On: 07/15/2023 15:36   CT Head Wo Contrast Result Date: 07/15/2023 CLINICAL DATA:  Provided history: Weakness. EXAM: CT HEAD WITHOUT CONTRAST TECHNIQUE: Contiguous axial images were obtained from the base of  the skull through the vertex without intravenous contrast. RADIATION DOSE REDUCTION: This exam was performed according to the departmental dose-optimization program which includes automated exposure control, adjustment of the mA and/or kV according to patient size and/or use of iterative reconstruction technique. COMPARISON:  Head CT 06/13/2023. FINDINGS: Brain: Cerebral atrophy. Patchy and ill-defined hypoattenuation within the cerebral white matter, nonspecific but compatible with moderate chronic small vessel ischemic disease. There is no acute intracranial hemorrhage. No demarcated cortical infarct. No extra-axial fluid collection. No evidence of an intracranial mass. No midline shift. Vascular: No hyperdense vessel. Atherosclerotic calcifications. Skull: No calvarial fracture or aggressive osseous lesion. Sinuses/Orbits: No mass or acute finding within the imaged orbits. Minimal mucosal thickening within the bilateral ethmoid and right maxillary sinuses. Other: Small-volume fluid within left mastoid air cells. IMPRESSION: 1. No evidence of an acute intracranial abnormality. 2. Moderate chronic small vessel ischemic changes within the cerebral white matter. 3. Cerebral atrophy. 4. Small left mastoid effusion. Electronically Signed   By: Jackey Loge D.O.   On: 07/15/2023 14:46   CT ABDOMEN PELVIS W CONTRAST Result Date: 07/05/2023 CLINICAL DATA:  Right lower quadrant abdominal pain. EXAM: CT ABDOMEN AND PELVIS WITH CONTRAST TECHNIQUE: Multidetector CT imaging of the abdomen and pelvis was performed using the standard protocol following bolus administration of intravenous contrast. RADIATION DOSE REDUCTION: This exam was performed  according to the departmental dose-optimization program which includes automated exposure control, adjustment of the mA and/or kV according to patient size and/or use of iterative reconstruction technique. CONTRAST:  OMNIPAQUE IOHEXOL 300 MG/ML  SOLN COMPARISON:  CT abdomen pelvis dated 06/30/2023. FINDINGS: Lower chest: There is bilateral bronchiectasis and reticulation. Hepatobiliary: No focal liver abnormality is seen. Layering density in the gallbladder may reflect sludge, small gallstones, or vicarious excretion of contrast. No gallbladder wall thickening or biliary dilatation. Pancreas: Unremarkable. No pancreatic ductal dilatation or surrounding inflammatory changes. Spleen: Normal in size without focal abnormality. Adrenals/Urinary Tract: Adrenal glands are unremarkable. Nonobstructive right renal calculi measure up to 4 mm in size. Bilateral renal cysts measure up to 6 cm on the right. No imaging follow-up is recommended for this finding. No hydronephrosis on either side. Bladder is unremarkable. Stomach/Bowel: Stomach is within normal limits. There is wall thickening of the transverse, descending, and proximal sigmoid colon, similar to prior exam. No significant surrounding inflammatory changes. There is caliber change to decompressed distal sigmoid colon in the pelvis which appears more pronounced than on prior exam. The sigmoid colon is in close proximity to the left ovary and the bladder and there appears to be some degree of tethering. The appendix is not identified and may be surgically absent. Vascular/Lymphatic: Aortic atherosclerosis. No enlarged abdominal or pelvic lymph nodes. Reproductive: Status post hysterectomy. Other: No abdominal wall hernia or abnormality. No abdominopelvic ascites. Musculoskeletal: Severe degenerative changes are seen in the spine. Fixation hardware is seen in the proximal left femur. IMPRESSION: 1. Wall thickening of the transverse, descending, and proximal sigmoid  colon, similar to prior exam. There is caliber change to decompressed distal sigmoid colon in the pelvis which appears more pronounced than on prior exam. There is apparent tethering of the sigmoid colon to the bladder and ovary which may result in some degree of constriction of the sigmoid colon. Aortic Atherosclerosis (ICD10-I70.0). Electronically Signed   By: Romona Curls M.D.   On: 07/05/2023 17:57    Labs:  Basic Metabolic Panel: Recent Labs  Lab 07/29/23 1831 07/31/23 1509 08/03/23 0546 08/03/23 1214  NA  --  143 140  --   K  --  4.3 4.3  --   CL  --  106 109  --   CO2  --  25 26  --   GLUCOSE  --  162* 90  --   BUN  --  14 13  --   CREATININE  --  0.99 0.82  --   CALCIUM  --  8.5* 8.4*  --   MG 1.6* 2.1  --  2.0    CBC: Recent Labs  Lab 08/03/23 0546  WBC 8.4  NEUTROABS 1.6*  HGB 11.2*  HCT 37.2  MCV 98.7  PLT 182    CBG: No results for input(s): "GLUCAP" in the last 168 hours.  Family history.  Mother with diabetes father with hypertension.  Denies any dementia or Alzheimer's disease  Brief HPI:   [name] is a 83 y.o. right-handed female with history significant for chronic sigmoid stricture related to diverticular disease, hypertension, hyperlipidemia, diabetes mellitus peripheral neuropathy, hypothyroidism, CKD stage III, gout, ILD, chronic respiratory failure/COPD on home oxygen 3 L at baseline, chronic diastolic congestive heart failure, rheumatoid arthritis on chronic steroids, history of GI bleed, DVT/PE, chronic pain maintained on MS Contin 15 mg every 12 hours as well as oxycodone as needed, iron deficiency anemia, obesity with BMI of 35.42, 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  Per chart review lives with spouse and son.  She has an aide Monday through Friday from 9-1.  Two-level home.  Modified independent with ADLs and rollator for mobility.  Patient with recent admission for sepsis 06/30/2023  - 07/10/2023 as well as viral respiratory illness and discharged for short time to skilled nursing facility as well as recent admission 06/13/2023 - 06/26/2023 for septic shock secondary to flu A/RSV pneumonia/UTI.  Presented 07/15/2023 with abdominal pain and diarrhea with black stools as well as hypertension.  She was given IV fluids and improvement in blood pressure.  Admission chemistries unremarkable except WBC 11,900 potassium 3.4 hemoglobin 11.8 creatinine 1.09 lipase 20 lactic acid 3.1 blood cultures no growth to date urinalysis negative nitrite.  Guaiac positive and noted low-grade fever.  Code sepsis was initiated.  Cranial CT scan negative.  CT of the abdomen pelvis showed dilation of colon abrupt change in caliber noted involving the sigmoid colon likely stricture.  General surgery consulted for large bowel obstruction secondary to distal sigmoid stenosis undergoing laparoscopic assisted diverting loop colostomy 07/17/2023 per Dr.Tseui.  Initially maintained on broad-spectrum antibiotics.  Blood pressure remained monitored initially managed with stress dose IV hydrocortisone/midodrine with significant improvement midodrine had been discontinued transition hydrocortisone to prednisone.  Patient's hemoglobin and hematocrit remained stable.  Rheumatoid arthritis currently with Plaquenil/leflunomide on hold.  She reports chronic joint pain particularly in her shoulders hands and feet.  She did not sleep well due to headaches.  Patient's prednisone has been resumed.  She did have a run of SVT and Toprol and been adjusted.  Subcutaneous Lovenox for DVT prophylaxis.  Diet has been advanced to mechanical soft.  Therapy evaluations completed due to patient's decreased functional mobility was admitted for a comprehensive rehab program.   Hospital Course: Loranda F Vazguez was admitted to rehab 07/20/2023 for inpatient therapies to consist of PT, ST and OT at least three hours five days a week. Past admission  physiatrist, therapy team and rehab RN have worked together to provide customized collaborative inpatient rehab.  Pertaining to patient's debility related to  septic shock due to symptomatic chronic sigmoid colon stricture status post diverting loop ostomy 2/28 follow-up per general surgery with teaching ongoing for ostomy care.  Lovenox for DVT prophylaxis no bleeding episodes.  Chronic pain syndrome with Neurontin scheduled 300 mg twice daily with Voltaren gel and Lidoderm patch with MS Contin 15 mg every 12 hours and MSIR as well as Robaxin as needed.  Mood stabilization with Cymbalta emotional support provided she was attending full therapies.  Hypotension resolved midodrine discontinued maintained on Lopressor titrated per cardiology services as well as Aldactone with Lasix 40 mg daily. Rheumatoid arthritis chronic prednisone resumed as well as plans to resume back her Plaquenil and leflunomide and follow-up outpatient.  Lipitor ongoing for hyperlipidemia.  Blood sugars overall controlled hemoglobin A1c 5.6.  Synthroid ongoing for hypothyroidism.  Chronic respiratory failure/COPD/ILD secondary to RA chronic oxygen therapy 3 L at baseline prophylactic Zithromax resumed .  Chronic diastolic congestive heart failure monitoring for any signs of fluid overload and continued diuresis.  Iron deficiency anemia hemoglobin hematocrit mains stable.  Obesity BMI 35.42 with dietary follow-up.   Blood pressures were monitored on TID basis and controlled and monitored  Diabetes has been monitored with ac/hs CBG checks and SSI was use prn for tighter BS control.    Rehab course: During patient's stay in rehab weekly team conferences were held to monitor patient's progress, set goals and discuss barriers to discharge. At admission, patient required minimal assist 3 feet rolling walker moderate assist stand pivot transfers  Physical exam.  Blood pressure 145/93 pulse 94 temperature 97.9 respirations 15 oxygen saturation  is 100% room air Constitutional.  No acute distress HEENT Head.  Normocephalic and atraumatic Eyes.  Pupils round and reactive to light no discharge without nystagmus Neck.  Supple nontender no JVD without thyromegaly Cardiac regular rate and rhythm without any extra sounds or murmur heard Abdomen.  Soft nontender positive bowel sounds with ostomy in place Neurologic.  Alert and oriented x 3 Motor.  Right upper extremity 3/5 deltoid 4/5 bicep 4/5 tricep 4/5 grip Left upper extremity 3/5 deltoid 4/5 bicep 4/5 tricep 4/5 grip Right lower extremity hip flexors 4 -/5 knee extension 4/5 ADF 4/5 APF 4/5 Left lower extremity hip flexors 4 -/5 KE 4/5 ADF 4/5 APF 4/5  He/She  has had improvement in activity tolerance, balance, postural control as well as ability to compensate for deficits. He/She has had improvement in functional use RUE/LUE  and RLE/LLE as well as improvement in awareness.  Working with energy conservation techniques.  Patient supine to sit edge of bed with max assist and verbal cues to utilize bilateral upper extremities to assist with truncal elevation.  Patient stood from edge of bed x 3 rolling walker to attempt to increase standing tolerance.  Stand pivot transfers without rolling walker moderate/heavy moderate assist with verbal cues.  During ADLs sit to stand x 2 is moderate assist.  She needed mod max assist for lower body ADLs and toileting.  Family did not feel they could provide the necessary care at home and recommendations for skilled nursing facility.       Disposition:  Discharge disposition: 03-Skilled Nursing Facility        Diet: Soft  Special Instructions: No driving smoking or alcohol  Routine ostomy care  2 -3L oxygen nasal cannula keeping oxygen saturations greater than 92%  Scheduled out patient Dr Carlis Abbott for Qutenza for diabetic peripheral neuropathy  Medications at discharge. 1.  Tylenol as needed 2.  Lipitor 10  mg p.o. daily 3.  Voltaren  gel 2 g 3 times daily 4.  Cymbalta 60 mg daily 5.  Neurontin 300 mg p.o. twice daily 6.  Mucinex 1200 mg p.o. twice daily 7.  Synthroid 50 mcg p.o. daily 8.  Lidoderm patch changes directed 9.  Robaxin 500 mg p.o. every 6 hours as needed muscle spasms 10.  Lopressor 50 mg twice daily as well as 12.5 mg twice daily for a total of 62.5 mg twice daily 11.  MS Contin 15 mg p.o. every 12 hours 12.  MSIR 15 mg BID as needed severe pain 13.  Protonix 40 mg p.o. twice daily 14.  Prednisone 10 mg p.o. nightly 15.  Aldactone 12.5 mg daily 16.  Vitamin D 2000 units p.o. daily 17.  Plaquenil 200 mg p.o. daily 18.Arava 20 mg p.o. daily 19.  Multivitamin daily 20.  Albuterol inhaler 2 puffs as needed shortness of breath 21.  Zithromax 250 mg Monday Wednesday and Friday 22.  Klor-Con 40 mill equivalents twice daily 23.  Voltaren gel 2 g 3 times daily 24.  Lasix 40 mg p.o. daily 25.  Magnesium gluconate 250 mg p.o. nightly 26.  Nitroglycerin as needed chest pain   30-35 minutes were spent completing discharge summary and discharge planning    Follow-up Information     Raulkar, Drema Pry, MD Follow up.   Specialty: Physical Medicine and Rehabilitation Why: No formal follow-up needed Contact information: [address] [place] [address] [phone]         Manus Rudd, MD Follow up.   Specialty: General Surgery Why: Call for appointment Contact information: [address] [place] [address] [phone]         Runell Gess, MD Follow up.   Specialties: Cardiology, Radiology Why: call for appointment Contact information: [address] [place] [address] [phone]                 Signed: Charlton Amor 08/04/2023, 9:45 AM

## 2023-07-21 NOTE — Progress Notes (Signed)
 Inpatient Rehabilitation Admission Medication Review by a Pharmacist  A complete drug regimen review was completed for this patient to identify any potential clinically significant medication issues.  High Risk Drug Classes Is patient taking? Indication by Medication  Antipsychotic No   Anticoagulant Yes Lovenox: VTE ppx  Antibiotic Yes Azithromycin: Chronic respiratory failure/COPD ILD   Opioid Yes Morphine, oxycodone: pain  Antiplatelet No   Hypoglycemics/insulin Yes SSI: DM2  Vasoactive Medication Yes Toprol: SVT  Chemotherapy No   Other Yes Lipitor: HLD Tylenol, Voltaren gel, Lidoderm: pain Duloxetine: mood/behavior/sleep Gabapentin: pain Mucinex: mucus thinning Levothyroxine: hypothyroid Protonix: GERD Prednisone: RA Albuterol: SOB/wheezing Robaxin: muscle spasms     Type of Medication Issue Identified Description of Issue Recommendation(s)  Drug Interaction(s) (clinically significant)     Duplicate Therapy     Allergy     No Medication Administration End Date     Incorrect Dose     Additional Drug Therapy Needed     Significant med changes from prior encounter (inform family/care partners about these prior to discharge). Plaquenil/Leflunomide on hold: RA Benicar/hydralazine on hold: HTN Other prior to inpatient admission meds not currently ordered: metolazone, Lasix, KCl, simethicone, calcium, Bentyl, lomotil, Linzess, Ferrous sulfate, MVI, Zofran, Flonase, Miralax, Ozempic  Restart or discontinue as appropriate. Communicate medication changes with patient/family at discharge  Other       Clinically significant medication issues were identified that warrant physician communication and completion of prescribed/recommended actions by midnight of the next day:  No   Time spent performing this drug regimen review (minutes): 30  Thank you for allowing pharmacy to be a part of this patient's care.   Signe Colt, PharmD 07/21/2023 8:47 AM  **Pharmacist phone  directory can be found on [email or web address] listed under [facility]**

## 2023-07-21 NOTE — Progress Notes (Addendum)
 Patient ID: [name], female   DOB: 04/07/1941, 83 y.o.   MRN: [id number]  [id number] spoke with pt son Lyda Jester to introduce self, explain role, and discuss discharge process. He confirms pt will return to his home, however, the family is still working on establishing 24/7 care. States his father is relatively in good health but unable to provide physical support as he is 82 yrs old. SW informed will follow-up with updates after team conference.  *SW made attempts to meet with pt but she was not in room. SW will follow-up to complete assessment.   Cecile Sheerer, MSW, LCSW Office: [phone] Cell: [phone] Fax: [phone]

## 2023-07-21 NOTE — Progress Notes (Signed)
 Physical Therapy Session Note  Patient Details  Name: [name] MRN: [id number] Date of Birth: 02-Jul-1940  Today's Date: 07/21/2023 PT Individual Time: 1301-1357 PT Individual Time Calculation (min): 56 min   Short Term Goals: Week 1:  PT Short Term Goal 1 (Week 1): pt will perform bed mobility with min A consistantly PT Short Term Goal 2 (Week 1): pt will transfer bed<>chair with LRAD and CGA PT Short Term Goal 3 (Week 1): pt will ambulate 56ft with LRAD and CGA  Skilled Therapeutic Interventions/Progress Updates:   Received pt sitting in [facility] with daughter at bedside. Pt agreeable to PT treatment and reported pain 5/10 in bilateral legs, L hip, and back (premedicated) - repositioning, rest breaks, and distraction done to reduce pain levels. Session with emphasis on functional mobility/transfers, generalized strengthening and endurance, dynamic standing balance/coordination, and ambulation. Pt on RA with SPO2 98% at rest, dropping to 92% with mobility. Donned gloves (for improved grip) and pt transported to/from room in [facility] dependently for energy conservation purposes. Stood from [facility] with RW x 2 trials with min A - cues for hand placement on RW. Pt ambulated 54ft x 1 and 81ft x 1 with RW and min A with close WC follow. Pt with extremely flexed trunk on trial 1 (almost bent over at 90 degrees), improved with trial 2 but unable to come completely upright and reported L knee was going to "give out". Attempted to find youth RW but unable to locate one.   Pt declined any further standing/ambulation due to pain - performed seated BLE strengthening on Kinetron at 20 cm/sec for 1 minute x 3 trials with emphasis on glute/quad strength - pt limited by increased pain in L knee. Transitioned to the following seated exercises with emphasis on LE strength/ROM: -LAQ 2x10 bilaterally -hip flexion 2x10 bilaterally  -hip adduction isometrics 2x10 Returned to room and pt requested to remain up. Concluded session  with pt sitting in WC, needs within reach, and seatbelt alarm on. Pt left on RA with SPO2 96% - RN notified.  Therapy Documentation Precautions:  Restrictions Weight Bearing Restrictions Per Provider Order: No  Therapy/Group: Individual Therapy Marlana Salvage Zaunegger Blima Rich PT, DPT 07/21/2023, 7:06 AM

## 2023-07-22 DIAGNOSIS — R5381 Other malaise: Secondary | ICD-10-CM | POA: Diagnosis not present

## 2023-07-22 LAB — CBC WITH DIFFERENTIAL/PLATELET
Abs Immature Granulocytes: 0.09 10*3/uL — ABNORMAL HIGH (ref 0.00–0.07)
Basophils Absolute: 0 10*3/uL (ref 0.0–0.1)
Basophils Relative: 0 %
Eosinophils Absolute: 0.1 10*3/uL (ref 0.0–0.5)
Eosinophils Relative: 1 %
HCT: 32.9 % — ABNORMAL LOW (ref 36.0–46.0)
Hemoglobin: 10.2 g/dL — ABNORMAL LOW (ref 12.0–15.0)
Immature Granulocytes: 1 %
Lymphocytes Relative: 27 %
Lymphs Abs: 2.5 10*3/uL (ref 0.7–4.0)
MCH: 30 pg (ref 26.0–34.0)
MCHC: 31 g/dL (ref 30.0–36.0)
MCV: 96.8 fL (ref 80.0–100.0)
Monocytes Absolute: 0.5 10*3/uL (ref 0.1–1.0)
Monocytes Relative: 5 %
Neutro Abs: 6.2 10*3/uL (ref 1.7–7.7)
Neutrophils Relative %: 66 %
Platelets: 154 10*3/uL (ref 150–400)
RBC: 3.4 MIL/uL — ABNORMAL LOW (ref 3.87–5.11)
RDW: 16.3 % — ABNORMAL HIGH (ref 11.5–15.5)
WBC: 9.3 10*3/uL (ref 4.0–10.5)
nRBC: 0 % (ref 0.0–0.2)

## 2023-07-22 MED ORDER — CYANOCOBALAMIN 1000 MCG/ML IJ SOLN
1000.0000 ug | Freq: Once | INTRAMUSCULAR | Status: AC
  Administered 2023-07-22: 1000 ug via INTRAMUSCULAR
  Filled 2023-07-22: qty 1

## 2023-07-22 MED ORDER — HYDROXYCHLOROQUINE SULFATE 200 MG PO TABS
200.0000 mg | ORAL_TABLET | Freq: Every day | ORAL | Status: DC
  Administered 2023-07-22 – 2023-08-04 (×14): 200 mg via ORAL
  Filled 2023-07-22 (×14): qty 1

## 2023-07-22 MED ORDER — MAGNESIUM GLUCONATE 500 MG PO TABS
250.0000 mg | ORAL_TABLET | Freq: Every day | ORAL | Status: DC
  Administered 2023-07-22 – 2023-08-03 (×13): 250 mg via ORAL
  Filled 2023-07-22 (×13): qty 1

## 2023-07-22 NOTE — Progress Notes (Signed)
 Patient ID: [name], female   DOB: 10-01-1940, 83 y.o.   MRN: [id number]  SW met with pt in room to provide updates form team conference, and d/c date 3/18. Pt realizes that she will need help when she goes home,and is hopeful  she will have more improvement so she can do more for herself.   1546-SW discussed above updates with pt son Lyda Jester. He will be working on finding additional supports for pt at home. States he is going out of town on 3/20 and will discuss with his siblings. States prior to her admission she was not doing as well, and transfers and feeling like dead weight is not new. He is aware updates next week can vary, and pt more will make progress however will know more about how much physical assistance she will require. Fam edu scheduled for next Friday 1pm-4pm.   Cecile Sheerer, MSW, LCSW Office: [phone] Cell: [phone] Fax: [phone]

## 2023-07-22 NOTE — Progress Notes (Signed)
 Physical Therapy Session Note  Patient Details  Name: [name] MRN: [id number] Date of Birth: [birth year]  Today's Date: 07/22/2023 PT Individual Time: 0731-0815 PT Individual Time Calculation (min): 44 min  Today's Date: 07/22/2023 PT Missed Time: 16 Minutes Missed Time Reason: Other (Comment) (EKG)  Short Term Goals: Week 1:  PT Short Term Goal 1 (Week 1): pt will perform bed mobility with min A consistantly PT Short Term Goal 2 (Week 1): pt will transfer bed<>chair with LRAD and CGA PT Short Term Goal 3 (Week 1): pt will ambulate 70ft with LRAD and CGA  Skilled Therapeutic Interventions/Progress Updates:   Received pt semi-reclined in bed, pt agreeable to PT treatment, and reported generalized pain but did not specify (premedicated). Pt transferred semi-reclined<>sitting R EOB with HOB elevated and use of bedrails with more than adequate time and increased effort. Pt able to reach R sidelying without assist, but required mod A for trunk control to sit upright EOB. Of note, pt continues to remain extremely slow to mobilize and gets easily distracted. Donned pants sitting EOB with max A for time management purposes, then pt reported urge to void.   Removed supplemental O2 (cleared by MD and pt initially on 2L) and SPO2 remained >95% throughout session on RA. Pt required x2 attempts and min A to stand from EOB with RW. Attempted stand<>pivot into WC with RW, however pt flexed over RW at 90 degrees and unable to come upright - assisted with pivoting hips back to bed for safety. Noted pt's HR 180bpm after attempting transfer - RN called to bedside and PA, Dan, notified. Pt's HR decreasing to 119bpm with prolonged seated rest and BP: 146/100. PA requesting pt return to bed and assess. Pt transferred into supine with max A for BLE management and required +2 assist to scoot to [facility]. MD notified and EKG performed. Pt left semi-reclined in bed with PA and 2 RN's at bedside performing EKG. 16 minutes  missed of skilled physical therapy.   Therapy Documentation Precautions:  Precautions Precautions: Fall Precaution/Restrictions Comments: ostomy, monitor O2 and HR Restrictions Weight Bearing Restrictions Per Provider Order: No  Therapy/Group: Individual Therapy Marlana Salvage Zaunegger Blima Rich PT, DPT 07/22/2023, 6:49 AM

## 2023-07-22 NOTE — Progress Notes (Signed)
   07/22/23 0800  Assess: MEWS Score  BP (!) 158/98  MAP (mmHg) 114  Pulse Rate (!) 119  SpO2 97 %  O2 Device Room Air  Assess: MEWS Score  MEWS Temp 0  MEWS Systolic 0  MEWS Pulse 2  MEWS RR 0  MEWS LOC 0  MEWS Score 2  MEWS Score Color Yellow  Assess: if the MEWS score is Yellow or Red  Were vital signs accurate and taken at a resting state? Yes  Does the patient meet 2 or more of the SIRS criteria? No  MEWS guidelines implemented  Yes, yellow  Treat  MEWS Interventions Considered administering scheduled or prn medications/treatments as ordered  Take Vital Signs  Increase Vital Sign Frequency  Yellow: Q2hr x1, continue Q4hrs until patient remains green for 12hrs  Escalate  MEWS: Escalate Yellow: Discuss with charge nurse and consider notifying provider and/or RRT  Notify: Charge Nurse/RN  Name of Charge Nurse/RN Notified Otilio Carpen, RN  Provider Notification  Provider Name/Title Deatra Ina, PA/Dr. Carlis Abbott  Date Provider Notified 07/22/23  Time Provider Notified 0800  Method of Notification Face-to-face;Rounds;Call  Notification Reason Change in status  Provider response At bedside  Date of Provider Response 07/22/23  Time of Provider Response 0800  Assess: SIRS CRITERIA  SIRS Temperature  0  SIRS Respirations  0  SIRS Pulse 1  SIRS WBC 0  SIRS Score Sum  1

## 2023-07-22 NOTE — Progress Notes (Signed)
 Informed by therapist- patient's HR 180 standing, 170, and now 119. Notified  D. Angiulli, PA and Dr. Carlis Abbott. New orders placed. MEWS protocol implemented.    Tilden Dome, LPN

## 2023-07-22 NOTE — Progress Notes (Signed)
 Occupational Therapy Session Note  Patient Details  Name: [name] MRN: [id number] Date of Birth: [birth year]  Today's Date: 07/22/2023 OT Individual Time: 4098-1191 & 1420-1530 OT Individual Time Calculation (min): 70 min & 70 min   Short Term Goals: Week 1:  OT Short Term Goal 1 (Week 1): Pt will complete sit > stand in prep for ADL with CGA using LRAD OT Short Term Goal 2 (Week 1): Pt will complete toilet transfer with CGA using LRAD OT Short Term Goal 3 (Week 1): Pt will complete 1/3 toileting steps with CGA for balance OT Short Term Goal 4 (Week 1): Pt will thread LB clothing with supervision using AE PRN  Skilled Therapeutic Interventions/Progress Updates:  Session 1 Skilled OT intervention completed with focus on activity tolerance, functional transfers. Pt received upright in bed, agreeable to session. Lower back pain reported; pre-medicated. OT applied hot packs to lower back, and offered rest breaks, repositioning throughout for pain reduction. MD notified of continued back pain and request for heating pad.  Per care team, pt with tachycardia with PT this AM however EKG completed, and per PA, pt cleared for therapy with plan to adjust HR meds but anticipate pt is very deconditioned and needs adequate rest and adjustments during activity.  Pt reported that her hips got stuck in standard BSC during toileting yesterday. OT retrieved bariatric BSC and swapped with standard for future functional toileting.   Pt required significant time between all tasks, verbalizing fatigue and rest accommodated due to tachycardia during session; see below. Transitioned > EOB with supervision with HOB at 90 degrees. Able to sit EOB with supervision for several mins while discussing HR trends with debility and energy conservation strategies. Per MD, pt may need to have O2 for comfort 2/2 pulmonary fibrosis, however plan to check in with pulmonology.   Min A sit > stand and CGA stand pivot with RW  > w/c. Transported dependently in w/c <> gym. Seated in w/c, pt completed the following BUE exercises to promote BUE strength/endurance needed for independence with functional transfers and BADLs: (With 3 lb dumbbell) -2x10 bicep curls, each arm  Back in room, pt remained seated in w/c, with belt alarm on/activated, and with all needs in reach at end of session.  Vitals -Received on RA, SPO2 92%; HR 104 bpm at rest -desat to 88% and HR increased to 108 bpm with transition to EOB but increased to 92% sat -HR 160 bpm after transfer to w/c but with several mins of rest did lower to 110 bpm   Session 2 Skilled OT intervention completed with focus on activity tolerance, blocked sit > stands, functional transfers, standing tolerance. Pt received upright in bed, agreeable to session. Lower back pain reported with mobility; pre-medicated. OT offered rest breaks, repositioning throughout for pain reduction.  Pt very deconditioned, requiring frequent rest breaks, and slow transitions to accommodate activity tolerance throughout. Transitioned to EOB with supervision however mod cues for technique and HOB elevated. Min A sit > stand, CGA stand pivot with RW > w/c. Transported dependently in w/c <> gym.  Min A sit > stand using RW then initial CGA stand pivot increasing to min A with RW as pt "felt back giving out" and prematurely sat, requiring OT to position hips safely on mat.  Blocked practice with sit <> stands. Pt required CGA/min A using RW x 6 however pt with poor standing tolerance, with about 5-7 sec tolerance per trial prior to onset of back pain and readiness  to sit 2/2 fatigue. Extended seated breaks needed for fatigue.  Min A sit > stand and min A stand pivot using RW > w/c. Education provided on timed toileting especially on lasix meds as back of pt's pants noted to be soiled and pt reports she struggled with incontinence at baseline. Back in room, OT prepped pt for stedy transfer, then direct  care handoff to NT at end of session due to OT time constraint. All immediate needs met at end of session.  Vitals -Received on RA, SPO2 92%; HR 109 bpm at rest -O2 sat increased to 97% with activity, HR to 120 bpm but recovered to 110 bpm with extended rest   Therapy Documentation Precautions:  Precautions Precautions: Fall Precaution/Restrictions Comments: ostomy, monitor O2 and HR Restrictions Weight Bearing Restrictions Per Provider Order: No    Therapy/Group: Individual Therapy  Melvyn Novas, MS, OTR/L  07/22/2023, 3:40 PM

## 2023-07-22 NOTE — Care Management (Signed)
 [facility] Individual Statement of Services  Patient Name:  [name]  Date:  07/22/2023  Welcome to the [facility]patient [facility].  Our goal is to provide you with an individualized program based on your diagnosis and situation, designed to meet your specific needs.  With this comprehensive rehabilitation program, you will be expected to participate in at least 3 hours of rehabilitation therapies Monday-Friday, with modified therapy programming on the weekends.  Your rehabilitation program will include the following services:  Physical Therapy (PT), Occupational Therapy (OT), 24 hour per day rehabilitation nursing, Therapeutic Recreaction (TR), Psychology, Neuropsychology, Care Coordinator, Rehabilitation Medicine, Nutrition Services, Pharmacy Services, and Other  Weekly team conferences will be held on Wednesdays to discuss your progress.  Your Inpatient Rehabilitation Care Coordinator will talk with you frequently to get your input and to update you on team discussions.  Team conferences with you and your family in attendance may also be held.  Expected length of stay: 14 days    Overall anticipated outcome: Supervision  Depending on your progress and recovery, your program may change. Your Inpatient Rehabilitation Care Coordinator will coordinate services and will keep you informed of any changes. Your Inpatient Rehabilitation Care Coordinator's name and contact numbers are listed  below.  The following services may also be recommended but are not provided by the Inpatient Rehabilitation Center:  Driving Evaluations Home Health Rehabiltiation Services Outpatient Rehabilitation Services Vocational Rehabilitation   Arrangements will be made to provide these services after discharge if needed.  Arrangements include referral to agencies that provide these services.  Your insurance has been verified to be:  Medicare A/B  Your primary doctor is:  Abbe Amsterdam  Pertinent information will be shared with your doctor and your insurance company.  Inpatient Rehabilitation Care Coordinator:  Susie Cassette [phone] or ([phone]  Information discussed with and copy given to patient by: Gretchen Short, 07/22/2023, 9:47 AM

## 2023-07-22 NOTE — Plan of Care (Signed)
  Problem: RH BOWEL ELIMINATION Goal: RH STG MANAGE BOWEL WITH ASSISTANCE Description: STG Manage Bowel with min Assistance. Outcome: Progressing   Problem: RH BLADDER ELIMINATION Goal: RH STG MANAGE BLADDER WITH ASSISTANCE Description: STG Manage Bladder With toileting Assistance Outcome: Progressing   Problem: RH SKIN INTEGRITY Goal: RH STG ABLE TO PERFORM INCISION/WOUND CARE W/ASSISTANCE Description: STG Able To Perform Incision/Wound Care With min Assistance. Outcome: Progressing   Problem: RH SAFETY Goal: RH STG ADHERE TO SAFETY PRECAUTIONS W/ASSISTANCE/DEVICE Description: STG Adhere to Safety Precautions With cues Assistance/Device. Outcome: Progressing   Problem: RH PAIN MANAGEMENT Goal: RH STG PAIN MANAGED AT OR BELOW PT'S PAIN GOAL Description: < 4 with prns Outcome: Progressing   Problem: RH KNOWLEDGE DEFICIT GENERAL Goal: RH STG INCREASE KNOWLEDGE OF SELF CARE AFTER HOSPITALIZATION Description: Patient and spouse will be able to manage care at discharge using educational resources for medications and skin care and dietary modification independently Outcome: Progressing

## 2023-07-22 NOTE — Progress Notes (Addendum)
 PROGRESS NOTE   Subjective/Complaints: No new complaints this morning Satting well off room air Hgb decreased yesterday, will repeat today  ROD: +chronic pain, +fatigue   Objective:   No results found. Recent Labs    07/20/23 1817 07/21/23 1014  WBC 7.6 7.3  HGB 10.7* 9.7*  HCT 34.9* 30.8*  PLT 170 158   Recent Labs    07/20/23 0444 07/20/23 1817 07/21/23 1014  NA 141  --  143  K 4.3  --  3.3*  CL 109  --  109  CO2 26  --  25  GLUCOSE 109*  --  96  BUN 8  --  7*  CREATININE 0.66 0.61 0.56  CALCIUM 7.8*  --  7.8*    Intake/Output Summary (Last 24 hours) at 07/22/2023 1252 Last data filed at 07/22/2023 0803 Gross per 24 hour  Intake 480 ml  Output 125 ml  Net 355 ml        Physical Exam: Vital Signs Blood pressure (P) 131/79, pulse (!) (P) 108, temperature (P) 98 F (36.7 C), temperature source (P) Oral, resp. rate (P) 16, weight 89.8 kg, SpO2 (P) 94%. Gen: no distress, normal appearing HEENT: oral mucosa pink and moist, NCAT Cardio: Reg rate Chest: normal effort, normal rate of breathing Abd: soft, non-distended Ext: no edema Psych: pleasant, normal affect Skin: intact Mental Status: AAOx3, memory grossly intact, fund of knowledge appropriate Speech/Languate: Follows commands CRANIAL NERVES: Cranial nerves II through XII grossly intact     MOTOR: RUE: 3/5 Deltoid, 4/5 Biceps, 4/5 Triceps,4/5 Grip LUE: 3/5 Deltoid, 4/5 Biceps, 4/5 Triceps, 4/5 Grip RLE: HF 4-/5, KE 4/5, ADF 4/5, APF 4/5 LLE: HF 4-/5, KE 4/5, ADF 4/5, APF 4/5   SENSORY: Normal to touch all 4 extremities   Coordination: Normal finger to nose, no tremor, no dysmetria, stable 3/5  Assessment/Plan: 1. Functional deficits which require 3+ hours per day of interdisciplinary therapy in a comprehensive inpatient rehab setting. Physiatrist is providing close team supervision and 24 hour management of active medical problems listed  below. Physiatrist and rehab team continue to assess barriers to discharge/monitor patient progress toward functional and medical goals  Care Tool:  Bathing    Body parts bathed by patient: Left arm, Chest, Abdomen, Face   Body parts bathed by helper: Right arm, Front perineal area, Buttocks, Right upper leg, Left upper leg, Right lower leg, Left lower leg     Bathing assist Assist Level: Maximal Assistance - Patient 24 - 49%     Upper Body Dressing/Undressing Upper body dressing   What is the patient wearing?: Pull over shirt    Upper body assist Assist Level: Moderate Assistance - Patient 50 - 74%    Lower Body Dressing/Undressing Lower body dressing      What is the patient wearing?: Incontinence brief, Pants     Lower body assist Assist for lower body dressing: Total Assistance - Patient < 25%     Toileting Toileting    Toileting assist Assist for toileting: Total Assistance - Patient < 25%     Transfers Chair/bed transfer  Transfers assist  Chair/bed transfer activity did not occur: Safety/medical concerns (fatigue, pain)  Chair/bed transfer assist level: Minimal Assistance - Patient > 75%     Locomotion Ambulation   Ambulation assist   Ambulation activity did not occur: Safety/medical concerns (fatigue, pain)          Walk 10 feet activity   Assist  Walk 10 feet activity did not occur: Safety/medical concerns (fatigue, pain)        Walk 50 feet activity   Assist Walk 50 feet with 2 turns activity did not occur: Safety/medical concerns (fatigue, pain)         Walk 150 feet activity   Assist Walk 150 feet activity did not occur: Safety/medical concerns (fatigue, pain)         Walk 10 feet on uneven surface  activity   Assist Walk 10 feet on uneven surfaces activity did not occur: Safety/medical concerns (fatigue, pain)         Wheelchair     Assist Is the patient using a wheelchair?: Yes Type of Wheelchair:  Manual Wheelchair activity did not occur: Safety/medical concerns (fatigue, pain)         Wheelchair 50 feet with 2 turns activity    Assist    Wheelchair 50 feet with 2 turns activity did not occur: Safety/medical concerns (fatigue, pain)       Wheelchair 150 feet activity     Assist  Wheelchair 150 feet activity did not occur: Safety/medical concerns (fatigue, pain)       Blood pressure (P) 131/79, pulse (!) (P) 108, temperature (P) 98 F (36.7 C), temperature source (P) Oral, resp. rate (P) 16, weight 89.8 kg, SpO2 (P) 94%.  Medical Problem List and Plan: 1. Functional deficits secondary to septic shock/debility due to symptomatic chronic sigmoid colon stricture.  Status post diverting loop ostomy 2/28             -patient may shower if ostomy covered             -ELOS/Goals: 12-14, Min A to sup PT/OT             -Grounds pass ordered  2.  Antithrombotics: -DVT/anticoagulation:  Pharmaceutical: Lovenox             -antiplatelet therapy: N/A  3. Pain Management/chronic pain: Neurontin 300 mg twice daily, Voltaren gel 2 g 3 times daily, Lidoderm patch as directed, MS Contin 15 mg every 12 hours, oxycodone changed to MRIR BID prn, Robaxin as needed  4. Mood/Behavior/Sleep: Cymbalta 60 mg daily             -antipsychotic agents: N/A 5. Neuropsych/cognition: This patient is capable of making decisions on her own behalf.  6. Skin/Wound Care: Routine skin checks  7. Fluids/Electrolytes/Nutrition: Routine in and outs with follow-up chemistries             Hypokalemia- Recheck labs 8.  Hypotension.  Resolved.  Midodrine discontinued.  Toprol-XL 50 mg daily.  Home Benicar/hydralazine on hold resumed as needed.    9.  Rheumatoid arthritis.  Chronic prednisone resume.Leflunomide/Plaquenil currently on hold- recommended discussion of resumption with her outpatient PCP to minimize immune suppression post-operatively  10.  Hyperlipidemia.  Lipitor  11.  Diabetes mellitus  type 2 with peripheral neuropathy.  Hemoglobin A1c 5.6.  SSI.              -CBGs well controlled  12.  Hypothyroidism.  Synthroid  13.  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  Chronic oxygen.  3 L at baseline prior to admission.  Prophylactic  Zithromax resumed  14.  Chronic diastolic congestive heart failure.  Monitor for any signs of fluid overload. Daily weights  15.  CKD stage III.  Follow-up chemistries. Last Cr 0.66 3/3  16.  Iron deficiency anemia.  Follow-up CBC  17.  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  Status post gamma knife 10/13.  Follow-up outpatient  18.  Obesity.  BMI 35.42.  Dietary follow-up, d/c feeding supplement  19. HLD. continue stain  20. Hemorrhoid: tucks ordered, continue  21. Fatigue: B12 injection ordered 3/5  22. Tachycardia: consulted cardiology and no changes recommended    LOS: 2 days A FACE TO FACE EVALUATION WAS PERFORMED  Drema Pry Sereniti Wan 07/22/2023, 12:52 PM

## 2023-07-23 DIAGNOSIS — I5032 Chronic diastolic (congestive) heart failure: Secondary | ICD-10-CM | POA: Diagnosis not present

## 2023-07-23 DIAGNOSIS — I10 Essential (primary) hypertension: Secondary | ICD-10-CM | POA: Diagnosis not present

## 2023-07-23 DIAGNOSIS — R5381 Other malaise: Secondary | ICD-10-CM | POA: Diagnosis not present

## 2023-07-23 DIAGNOSIS — R000 Tachycardia, unspecified: Secondary | ICD-10-CM | POA: Diagnosis not present

## 2023-07-23 LAB — TROPONIN I (HIGH SENSITIVITY)
Troponin I (High Sensitivity): 32 ng/L — ABNORMAL HIGH (ref <18)
Troponin I (High Sensitivity): 28 ng/L — ABNORMAL HIGH (ref <18)

## 2023-07-23 LAB — BASIC METABOLIC PANEL
Anion gap: 3 — ABNORMAL LOW (ref 5–15)
Anion gap: 10 (ref 5–15)
BUN: 5 mg/dL — ABNORMAL LOW (ref 8–23)
BUN: <5 mg/dL — ABNORMAL LOW (ref 8–23)
CO2: 27 mmol/L (ref 22–32)
CO2: 25 mmol/L (ref 22–32)
Calcium: 7.1 mg/dL — ABNORMAL LOW (ref 8.9–10.3)
Calcium: 7.6 mg/dL — ABNORMAL LOW (ref 8.9–10.3)
Chloride: 110 mmol/L (ref 98–111)
Chloride: 107 mmol/L (ref 98–111)
Creatinine, Ser: 0.59 mg/dL (ref 0.44–1.00)
Creatinine, Ser: 0.58 mg/dL (ref 0.44–1.00)
GFR, Estimated: >60 mL/min (ref >60)
GFR, Estimated: >60 mL/min (ref >60)
Glucose, Bld: 89 mg/dL (ref 70–99)
Glucose, Bld: 163 mg/dL — ABNORMAL HIGH (ref 70–99)
Potassium: 2.8 mmol/L — ABNORMAL LOW (ref 3.5–5.1)
Potassium: 3.4 mmol/L — ABNORMAL LOW (ref 3.5–5.1)
Sodium: 140 mmol/L (ref 135–145)
Sodium: 142 mmol/L (ref 135–145)

## 2023-07-23 MED ORDER — HYDRALAZINE HCL 10 MG PO TABS
10.0000 mg | ORAL_TABLET | Freq: Four times a day (QID) | ORAL | Status: DC | PRN
  Administered 2023-07-23 – 2023-07-28 (×2): 10 mg via ORAL
  Filled 2023-07-23 (×3): qty 1

## 2023-07-23 MED ORDER — FUROSEMIDE 20 MG PO TABS
20.0000 mg | ORAL_TABLET | Freq: Once | ORAL | Status: AC
  Administered 2023-07-23: 20 mg via ORAL
  Filled 2023-07-23: qty 1

## 2023-07-23 MED ORDER — SPIRONOLACTONE 12.5 MG HALF TABLET
12.5000 mg | ORAL_TABLET | Freq: Every day | ORAL | Status: DC
  Administered 2023-07-23 – 2023-08-04 (×13): 12.5 mg via ORAL
  Filled 2023-07-23 (×14): qty 1

## 2023-07-23 MED ORDER — POTASSIUM CHLORIDE 10 MEQ/100ML IV SOLN
10.0000 meq | INTRAVENOUS | Status: AC
  Administered 2023-07-23 (×2): 10 meq via INTRAVENOUS
  Filled 2023-07-23 (×3): qty 100

## 2023-07-23 MED ORDER — METOPROLOL TARTRATE 50 MG PO TABS
50.0000 mg | ORAL_TABLET | Freq: Two times a day (BID) | ORAL | Status: DC
  Administered 2023-07-23 – 2023-07-28 (×10): 50 mg via ORAL
  Filled 2023-07-23 (×10): qty 1

## 2023-07-23 MED ORDER — FUROSEMIDE 40 MG PO TABS
40.0000 mg | ORAL_TABLET | Freq: Every day | ORAL | Status: DC
  Administered 2023-07-24 – 2023-07-29 (×6): 40 mg via ORAL
  Filled 2023-07-23 (×6): qty 1

## 2023-07-23 MED ORDER — POTASSIUM CHLORIDE CRYS ER 20 MEQ PO TBCR: 40.0000 meq | EXTENDED_RELEASE_TABLET | Freq: Two times a day (BID) | ORAL | Status: DC

## 2023-07-23 MED ORDER — NITROGLYCERIN 0.4 MG SL SUBL
0.4000 mg | SUBLINGUAL_TABLET | SUBLINGUAL | Status: DC | PRN
  Administered 2023-07-23: 0.4 mg via SUBLINGUAL
  Filled 2023-07-23: qty 1

## 2023-07-23 MED ORDER — POTASSIUM CHLORIDE CRYS ER 20 MEQ PO TBCR
40.0000 meq | EXTENDED_RELEASE_TABLET | Freq: Two times a day (BID) | ORAL | Status: DC
  Administered 2023-07-23 – 2023-08-04 (×25): 40 meq via ORAL
  Filled 2023-07-23 (×26): qty 2

## 2023-07-23 MED ORDER — POTASSIUM CHLORIDE CRYS ER 20 MEQ PO TBCR: 20.0000 meq | EXTENDED_RELEASE_TABLET | Freq: Three times a day (TID) | ORAL | Status: DC

## 2023-07-23 MED ORDER — POTASSIUM CHLORIDE 10 MEQ/100ML IV SOLN
10.0000 meq | INTRAVENOUS | Status: AC
  Filled 2023-07-23 (×3): qty 100

## 2023-07-23 MED ORDER — METOPROLOL SUCCINATE ER 25 MG PO TB24
75.0000 mg | ORAL_TABLET | Freq: Every day | ORAL | Status: DC
  Administered 2023-07-23: 75 mg via ORAL
  Filled 2023-07-23: qty 3

## 2023-07-23 MED ORDER — HYDROCERIN EX CREA
TOPICAL_CREAM | Freq: Two times a day (BID) | CUTANEOUS | Status: DC
  Administered 2023-08-03: 1 via TOPICAL
  Filled 2023-07-23 (×3): qty 113

## 2023-07-23 MED ORDER — CLONIDINE HCL 0.1 MG PO TABS: 0.1000 mg | ORAL_TABLET | Freq: Four times a day (QID) | ORAL | Status: DC | PRN

## 2023-07-23 NOTE — Plan of Care (Signed)
  Problem: Consults Goal: RH GENERAL PATIENT EDUCATION Description: See Patient Education module for education specifics. Outcome: Progressing   Problem: RH BOWEL ELIMINATION Goal: RH STG MANAGE BOWEL WITH ASSISTANCE Description: STG Manage Bowel with min Assistance. Outcome: Progressing Flowsheets (Taken 07/23/2023 1758) STG: Pt will manage bowels with assistance: Other (Comment) Note: Colostomy Bag   Problem: RH BLADDER ELIMINATION Goal: RH STG MANAGE BLADDER WITH ASSISTANCE Description: STG Manage Bladder With toileting Assistance Outcome: Progressing   Problem: RH SAFETY Goal: RH STG ADHERE TO SAFETY PRECAUTIONS W/ASSISTANCE/DEVICE Description: STG Adhere to Safety Precautions With cues Assistance/Device. Outcome: Progressing   Problem: RH PAIN MANAGEMENT Goal: RH STG PAIN MANAGED AT OR BELOW PT'S PAIN GOAL Description: < 4 with prns Outcome: Progressing

## 2023-07-23 NOTE — Progress Notes (Signed)
   07/23/23 0032  Assess: MEWS Score  Temp 98.2 F (36.8 C)  BP (!) 166/113  MAP (mmHg) 129  Pulse Rate (!) 131  Resp 18  SpO2 100 %  O2 Device Nasal Cannula  O2 Flow Rate (L/min) 2 L/min  Assess: MEWS Score  MEWS Temp 0  MEWS Systolic 0  MEWS Pulse 3  MEWS RR 0  MEWS LOC 0  MEWS Score 3  MEWS Score Color Yellow  Assess: if the MEWS score is Yellow or Red  Were vital signs accurate and taken at a resting state? Yes  Does the patient meet 2 or more of the SIRS criteria? No  MEWS guidelines implemented  No, previously yellow, continue vital signs every 4 hours  Notify: Charge Nurse/RN  Name of Charge Nurse/RN Notified jamie  Provider Notification  Provider Name/Title Marissa Nestle (PA)  Date Provider Notified 07/23/23  Time Provider Notified 0104  Method of Notification Call  Notification Reason Other (Comment) (Tarchycardia)  Provider response No new orders  Date of Provider Response 07/23/23  Time of Provider Response 0105  Assess: SIRS CRITERIA  SIRS Temperature  0  SIRS Respirations  0  SIRS Pulse 1  SIRS WBC 0  SIRS Score Sum  1

## 2023-07-23 NOTE — Progress Notes (Signed)
 Occupational Therapy Session Note  Patient Details  Name: [name] MRN: [id number] Date of Birth: 1940/06/04  Today's Date: 07/23/2023 OT Individual Time: 0920-1000 OT Individual Time Calculation (min): 40 min    Short Term Goals: Week 1:  OT Short Term Goal 1 (Week 1): Pt will complete sit > stand in prep for ADL with CGA using LRAD OT Short Term Goal 2 (Week 1): Pt will complete toilet transfer with CGA using LRAD OT Short Term Goal 3 (Week 1): Pt will complete 1/3 toileting steps with CGA for balance OT Short Term Goal 4 (Week 1): Pt will thread LB clothing with supervision using AE PRN  Skilled Therapeutic Interventions/Progress Updates:  Skilled OT intervention completed with focus on ADL retraining and activity tolerance. Pt received upright in bed. Pt endorsed that she was feeling unwell and pt with continued yellow MEWS but per MD, cleared for therapy and pt agreeable to ADLs at bed level.  HR noted to be 126 bpm at rest, with elevation to 160 bpm with simple bed mobility and positional changes though O2 remaining > 92% on RA. Team aware.  Current brief wasn't soiled, however was torn and unable to be fastened and also on disposable chucks from home that were damp. Advised pt to change the brief for functional one and for cloth chuck for skin integrity. Pt required intermittent min A to roll R<> L, with cues to bend knee prior to initiating roll to allow gravity to assist. Overall pt needed max A for threading of brief and pants. With [facility] elevated fully, pt still had difficulty achieving long sitting with bed rails for removal of shirt, but able to do with short durations for assist to doff the shirt. Bathed UB with supervision, and re-donned shirt with max A in same long sitting position. Utilized trendelenburg position with max A to boost > HOB with cues for using BLE to assist with pushing.   Pt remained upright in bed, with bed alarm on/activated, and with all needs in reach  at end of session.   Therapy Documentation Precautions:  Precautions Precautions: Fall Precaution/Restrictions Comments: ostomy, monitor O2 and HR Restrictions Weight Bearing Restrictions Per Provider Order: No    Therapy/Group: Individual Therapy  Melvyn Novas, MS, OTR/L  07/23/2023, 12:33 PM

## 2023-07-23 NOTE — Progress Notes (Signed)
 PROGRESS NOTE   Subjective/Complaints: Complained of chest pain last night, greatly improved this morning, troponin ordered, EKG ordered- shows sinus tachycardia with PVCs which patient has history of   ROD: +chronic pain, +fatigue, +lower extremity edema   Objective:   No results found. Recent Labs    07/21/23 1014 07/22/23 1343  WBC 7.3 9.3  HGB 9.7* 10.2*  HCT 30.8* 32.9*  PLT 158 154   Recent Labs    07/21/23 1014 07/23/23 0509  NA 143 140  K 3.3* 2.8*  CL 109 110  CO2 25 27  GLUCOSE 96 89  BUN 7* 5*  CREATININE 0.56 0.59  CALCIUM 7.8* 7.1*    Intake/Output Summary (Last 24 hours) at 07/23/2023 1048 Last data filed at 07/23/2023 0745 Gross per 24 hour  Intake 948 ml  Output --  Net 948 ml        Physical Exam: Vital Signs Blood pressure (!) 159/94, pulse (!) 106, temperature 97.8 F (36.6 C), temperature source Oral, resp. rate 20, weight 88.9 kg, SpO2 95%. Gen: no distress, normal appearing HEENT: oral mucosa pink and moist, NCAT Cardio: Sinus tachycardia Chest: normal effort, normal rate of breathing Abd: soft, non-distended Ext: no edema Psych: pleasant, normal affect Skin: intact Mental Status: AAOx3, memory grossly intact, fund of knowledge appropriate Speech/Languate: Follows commands CRANIAL NERVES: Cranial nerves II through XII grossly intact     MOTOR: RUE: 3/5 Deltoid, 4/5 Biceps, 4/5 Triceps,4/5 Grip LUE: 3/5 Deltoid, 4/5 Biceps, 4/5 Triceps, 4/5 Grip RLE: HF 4-/5, KE 4/5, ADF 4/5, APF 4/5 LLE: HF 4-/5, KE 4/5, ADF 4/5, APF 4/5   SENSORY: Normal to touch all 4 extremities   Coordination: Normal finger to nose, no tremor, no dysmetria, stable 3/5  Assessment/Plan: 1. Functional deficits which require 3+ hours per day of interdisciplinary therapy in a comprehensive inpatient rehab setting. Physiatrist is providing close team supervision and 24 hour management of active medical  problems listed below. Physiatrist and rehab team continue to assess barriers to discharge/monitor patient progress toward functional and medical goals  Care Tool:  Bathing    Body parts bathed by patient: Left arm, Chest, Abdomen, Face   Body parts bathed by helper: Right arm, Front perineal area, Buttocks, Right upper leg, Left upper leg, Right lower leg, Left lower leg     Bathing assist Assist Level: Maximal Assistance - Patient 24 - 49%     Upper Body Dressing/Undressing Upper body dressing   What is the patient wearing?: Pull over shirt    Upper body assist Assist Level: Moderate Assistance - Patient 50 - 74%    Lower Body Dressing/Undressing Lower body dressing      What is the patient wearing?: Incontinence brief, Pants     Lower body assist Assist for lower body dressing: Total Assistance - Patient < 25%     Toileting Toileting    Toileting assist Assist for toileting: Total Assistance - Patient < 25%     Transfers Chair/bed transfer  Transfers assist  Chair/bed transfer activity did not occur: Safety/medical concerns (fatigue, pain)  Chair/bed transfer assist level: Minimal Assistance - Patient > 75%     Locomotion Ambulation  Ambulation assist   Ambulation activity did not occur: Safety/medical concerns (fatigue, pain)          Walk 10 feet activity   Assist  Walk 10 feet activity did not occur: Safety/medical concerns (fatigue, pain)        Walk 50 feet activity   Assist Walk 50 feet with 2 turns activity did not occur: Safety/medical concerns (fatigue, pain)         Walk 150 feet activity   Assist Walk 150 feet activity did not occur: Safety/medical concerns (fatigue, pain)         Walk 10 feet on uneven surface  activity   Assist Walk 10 feet on uneven surfaces activity did not occur: Safety/medical concerns (fatigue, pain)         Wheelchair     Assist Is the patient using a wheelchair?: Yes Type of  Wheelchair: Manual Wheelchair activity did not occur: Safety/medical concerns (fatigue, pain)         Wheelchair 50 feet with 2 turns activity    Assist    Wheelchair 50 feet with 2 turns activity did not occur: Safety/medical concerns (fatigue, pain)       Wheelchair 150 feet activity     Assist  Wheelchair 150 feet activity did not occur: Safety/medical concerns (fatigue, pain)       Blood pressure (!) 159/94, pulse (!) 106, temperature 97.8 F (36.6 C), temperature source Oral, resp. rate 20, weight 88.9 kg, SpO2 95%.  Medical Problem List and Plan: 1. Functional deficits secondary to septic shock/debility due to symptomatic chronic sigmoid colon stricture.  Status post diverting loop ostomy 2/28             -patient may shower if ostomy covered             -ELOS/Goals: 12-14, Min A to sup PT/OT             -Grounds pass ordered  2.  Antithrombotics: -DVT/anticoagulation:  Pharmaceutical: Lovenox             -antiplatelet therapy: N/A  3. Pain Management/chronic pain: Neurontin 300 mg twice daily, Voltaren gel 2 g 3 times daily, Lidoderm patch as directed, MS Contin 15 mg every 12 hours, oxycodone changed to MRIR BID prn, Robaxin as needed  4. Mood/Behavior/Sleep: Cymbalta 60 mg daily             -antipsychotic agents: N/A 5. Neuropsych/cognition: This patient is capable of making decisions on her own behalf.  6. Skin/Wound Care: Routine skin checks  7. Fluids/Electrolytes/Nutrition: Routine in and outs with follow-up chemistries             Hypokalemia- Recheck labs 8.  Hypotension.  Resolved.  Midodrine discontinued.  Toprol-XL 50 mg daily.  Home Benicar/hydralazine on hold resumed as needed.    9.  Rheumatoid arthritis.  Chronic prednisone resume.Leflunomide/Plaquenil currently on hold- recommended discussion of resumption with her outpatient PCP to minimize immune suppression post-operatively  10.  Hyperlipidemia.  Lipitor  11.  Diabetes mellitus type  2 with peripheral neuropathy.  Hemoglobin A1c 5.6.  SSI.              -CBGs well controlled  12.  Hypothyroidism.  Synthroid  13.  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  Chronic oxygen.  3 L at baseline prior to admission.  Prophylactic Zithromax resumed  14.  Chronic diastolic congestive heart failure.  Monitor for any signs of fluid overload. Daily weights  15.  CKD stage III.  Follow-up chemistries. Last Cr 0.66 3/3  16.  Iron deficiency anemia.  Follow-up CBC  17.  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  Status post gamma knife 10/13.  Follow-up outpatient  18.  Obesity.  BMI 35.42.  Dietary follow-up, d/c feeding supplement  19. HLD. continue stain  20. Hemorrhoid: tucks ordered, continue  21. Fatigue: B12 injection ordered 3/5  22. Tachycardia: consulted cardiology and no changes recommended, lopressor increased to 75mg    23. Chest pain: troponin ordered  24. Lower extremity edema: lasix ordered  25. HTN: lasix ordered    LOS: 3 days A FACE TO FACE EVALUATION WAS PERFORMED  Drema Pry Loriene Taunton 07/23/2023, 10:48 AM

## 2023-07-23 NOTE — Progress Notes (Signed)
 Notified on call provider Pam Love (PA) about pt HR at 131, bp166/113. New orders received. Stat BMP, 12-lead EKG placed and prn Hydralazine 10mg  q6 for SBP>180 or DBP>100

## 2023-07-23 NOTE — Progress Notes (Signed)
 Dear Doctor:  This patient has been identified as a candidate for PICC for the following reason (s): poor veins/ multiple venipunctures. If you agree, please write an order for the indicated device. For any questions contact the Vascular Access Team at [phone] if no answer, please leave a message.  Thank you for supporting the early vascular access assessment program.

## 2023-07-23 NOTE — Patient Care Conference (Signed)
 Inpatient RehabilitationTeam Conference and Plan of Care Update Date: 07/22/2023   Time: 11:34 AM    Patient Name: [name]      Medical Record Number: [id number]  Date of Birth: 03-18-1941 Sex: Female         Room/Bed: 4W20C/4W20C-01 Payor Info: Payor: MEDICARE / Plan: MEDICARE PART A AND B / Product Type: *No Product type* /    Admit Date/Time:  07/20/2023  3:52 PM  Primary Diagnosis:  Debility  Hospital Problems: Principal Problem:   Debility Active Problems:   Hypothyroidism   Septic shock (HCC)   Type 2 diabetes mellitus with diabetic polyneuropathy, without long-term current use of insulin (HCC)   Iron deficiency anemia   Hyperlipidemia   Chronic diastolic congestive heart failure [facility])    Expected Discharge Date: Expected Discharge Date: 08/04/23  Team Members Present: Physician leading conference: Dr. Sula Soda Social Worker Present: Cecile Sheerer, LCSWA Nurse Present: Chana Bode, RN PT Present: Blima Rich, PT OT Present: Candee Furbish, OT PPS Coordinator present : Fae Pippin, SLP     Current Status/Progress Goal Weekly Team Focus  Bowel/Bladder   Pt with ostomy bag. Continent with bladder. Last emptied 07/21/23   Will remain continent and be able to provide ostomy care   Assist with toileting needs and provide education on ostomy care    Swallow/Nutrition/ Hydration               ADL's   Mod A UB, Total A LB and toileting   CGA/supervision   Barriers- global deconditioning, O2 dependence, LUE weakness, low back pain limiting erect posture    Mobility   bed mobility mod/max A, transfers with rollator heavy min/mod A   supervision  barriers: pain, fatigue, supplemental O2, global weakness/deconditioning    Communication                Safety/Cognition/ Behavioral Observations               Pain   Verbalizes back pain on a scale of 8/10. Pain well managed with current scheduled/prn pain medications   Will verbalizes  pain level <3   Assess pt for pain qshift/prn    Skin   Surgical incision (ostomy bag)   Will maintain skin intergrity with no breakdown  Assess skin for breakdown qshift/prn      Discharge Planning:  Pt will d/c to home with her son; son works 3rd shift. Pt husband able to provide supervision only. Family is working 24/7 care for when his mother is released. SW will confirm there are no barriers to discharge.   Team Discussion: Patient admitted with debility post colonic obstruction with diverting loop ostomy placement with history of pulmonary fibrosis on chronic supplemental oxygen and RA. Patient limited by poor balance, debility - weakness, safety awareness when using a walker.  Patient on target to meet rehab goals: yes, currently needs mod assist for upper body care and total assist for lower body care and toileting. Needs max assist for sit - supine and mod assist to sit up with min assist for ambulating with a RW. Goals for discharge set for CGA overall.  *See Care Plan and progress notes for long and short-term goals.   Revisions to Treatment Plan:  N/a   Teaching Needs: Safety, medications, transfers, toileting, etc.   Current Barriers to Discharge: Decreased caregiver support  Possible Resolutions to Barriers: Family education HH follow up services     Medical Summary Current Status: pulmoary fibrosis, morbid  obesity, rheuamtoid arthritis, tachycardia  Barriers to Discharge: Medical stability  Barriers to Discharge Comments: pulmonary fibrosis, morbid obesity, rheumatoid arthritis, tachycardia Possible Resolutions to [employer] Focus: O2 weaned to room air, provide dietary education, restart plaquenil, continue to montor HR, magnesium supplement started, cardiology consulted   Continued Need for Acute Rehabilitation Level of Care: The patient requires daily medical management by a physician with specialized training in physical medicine and rehabilitation  for the following reasons: Direction of a multidisciplinary physical rehabilitation program to maximize functional independence : Yes Medical management of patient stability for increased activity during participation in an intensive rehabilitation regime.: Yes Analysis of laboratory values and/or radiology reports with any subsequent need for medication adjustment and/or medical intervention. : Yes   I attest that I was present, lead the team conference, and concur with the assessment and plan of the team.   Chana Bode B 07/23/2023, 9:26 AM

## 2023-07-23 NOTE — IPOC Note (Signed)
 Overall Plan of Care [facility]) Patient Details Name: [name] MRN: [id number] DOB: 1941-04-01  Admitting Diagnosis: Debility  Hospital Problems: Principal Problem:   Debility Active Problems:   Hypothyroidism   Septic shock (HCC)   Type 2 diabetes mellitus with diabetic polyneuropathy, without long-term current use of insulin (HCC)   Iron deficiency anemia   Hyperlipidemia   Chronic diastolic congestive heart failure (HCC)     Functional Problem List: Nursing Bowel, Bladder, Safety, Pain, Endurance, Medication Management  PT Balance, Edema, Endurance, Motor, Pain, Nutrition, Sensory, Skin Integrity  OT Balance, Cognition, Edema, Motor, Endurance, Pain, Skin Integrity, Safety  SLP    TR         Basic ADL's: OT Bathing, Toileting, Dressing, Grooming     Advanced  ADL's: OT       Transfers: PT Bed Mobility, Bed to Chair, Car, Occupational psychologist, Research scientist (life sciences): PT Ambulation, Psychologist, prison and probation services, Stairs     Additional Impairments: OT Fuctional Use of Upper Extremity  SLP        TR      Anticipated Outcomes Item Anticipated Outcome  Self Feeding Independent  Swallowing      Basic self-care  Supervision  Toileting  Supervision   Bathroom Transfers Supervision/CGA  Bowel/Bladder  manage bowel w min assist and bladder w toileting  Transfers  supervision with LRAD  Locomotion  supervision with LRAD  Communication     Cognition     Pain  Pain < 4 with prns  Safety/Judgment  manage w cues   Therapy Plan: PT Intensity: Minimum of 1-2 x/day ,45 to 90 minutes PT Frequency: 5 out of 7 days PT Duration Estimated Length of Stay: 2 weeks OT Intensity: Minimum of 1-2 x/day, 45 to 90 minutes OT Frequency: 5 out of 7 days OT Duration/Estimated Length of Stay: 2 weeks     Team Interventions: Nursing Interventions Patient/Family Education, Pain Management, Medication Management, Bladder Management, Bowel Management, Discharge Planning,  Disease Management/Prevention  PT interventions Ambulation/gait training, Discharge planning, Functional mobility training, Psychosocial support, Therapeutic Activities, Balance/vestibular training, Disease management/prevention, Neuromuscular re-education, Skin care/wound management, Therapeutic Exercise, Wheelchair propulsion/positioning, Cognitive remediation/compensation, DME/adaptive equipment instruction, Pain management, Splinting/orthotics, UE/LE Strength taining/ROM, Community reintegration, Development worker, international aid stimulation, Patient/family education, Museum/gallery curator, UE/LE Coordination activities  OT Interventions Warden/ranger, Discharge planning, Pain management, Self Care/advanced ADL retraining, UE/LE Coordination activities, Cognitive remediation/compensation, Disease mangement/prevention, Functional mobility training, Patient/family education, Skin care/wound managment, Therapeutic Exercise, DME/adaptive equipment instruction, Neuromuscular re-education, Wheelchair propulsion/positioning, UE/LE Strength taining/ROM, Therapeutic Activities  SLP Interventions    TR Interventions    SW/CM Interventions     Barriers to Discharge MD  Medical stability  Nursing Decreased caregiver support, Other (comments) (new ostomy; chronic O2 3L/M [place]) 2 level main B+B 1 ste no rail w spouse and son who works 3rd shift; aide M-F 9a-1p; chronic O2 3L/M [place]  PT Decreased caregiver support, Weight, Other (comments) ostomy bag, global weakness/deconditioning, spouse is unable to physically assist.  OT Home environment access/layout, Incontinence, Weight    SLP      SW       Team Discharge Planning: Destination: PT-Home ,OT- Home , SLP-  Projected Follow-up: PT-Home health PT, OT-  Home health OT, 24 hour supervision/assistance, SLP-  Projected Equipment Needs: PT-To be determined, OT- To be determined, SLP-  Equipment Details: PT- , OT-  Patient/family involved in discharge planning: PT-  Patient,  OT-Patient, SLP-   MD ELOS: 12-14 days Medical Rehab Prognosis:  Excellent Assessment: The patient has been admitted for CIR therapies with the diagnosis of debility 2/2 septic shock. The team will be addressing functional mobility, strength, stamina, balance, safety, adaptive techniques and equipment, self-care, bowel and bladder mgt, patient and caregiver education. Goals have been set at [facility]. Anticipated discharge destination is home.        See Team Conference Notes for weekly updates to the plan of care

## 2023-07-23 NOTE — Consult Note (Addendum)
 Cardiology Consultation   Patient ID: [name] MRN: [id number]; DOB: [birth year]  Admit date: 07/20/2023 Date of Consult: 07/23/2023  PCP:  Joan Saint, MD   [facility] Providers Cardiologist:  Joan Batty, MD    Patient Profile:   [name] is a 83 y.o. female with a history of mild non-obstructive CAD on cardiac catheterization in 10/2014, chronic diastolic CHF, interstitial lung disease on home O2, prior PE/ DVT not anticoagulation given prior GI bleed, hypertension, hyperlipidemia, type 2 diabetes mellitus, hypothyroidism, CKD stage II, sigmoid colon stricture s/p diverting loop colostomy on 07/17/2023, chronic anemia, rheumatoid arthritis, and dementia who is being seen 07/23/2023 for the evaluation of tachycardia at the request of [name].  History of Present Illness:   [name] is a 83 year old female with the above history who is followed by [name]. Patient has a long history of dyspnea on exertion which is likely multifactorial due to CHF and interstitial lung disease. She underwent a right/ left cardiac catheterization in 10/2014 for further evaluation of chest pressure and dyspnea on exertion which showed mild CAD with no significant obstructive disease and mild pulmonary hypertension. Cardiac output was 7.6 L and cardiac index was 3.3 at that time. Repeat RHC in 07/2021 showed normal cardiac output with moderately elevated right atrial pressures and mildly elevated pulmonary artery pressures. (PCWP 22). She has a history of syncope in the past felt to be due to overmedication and dehydration from poor PO intake.   Patient was last seen by [name] in 04/2023 at which time she had significant lower extremity and was up 20 lbs but she had not been taking her diuretics regularly. She was counseled on the importance of medication compliance.  She has had multiple admissions over the last couple of months. She was admitted from 06/13/2023 to  06/26/2023 for septic shock secondary to influenza A/ RSV pneumonia and UTI. Hospitalization was complicated by acute encephalopathy, recurrent ileus treated with Pyridostigmine, and AKI. She was readmitted from 06/30/2023 to 07/10/2023 for enterocolitis and sigmoid stricture. She was evaluated by GI and General Surgery. Stricture was felt to be secondary to chronic inflammation from diverticulitis. Conservative therapy was recommended. She was admitted again from 07/15/2023 for left lower quadrant pain and persistent diarrhea and ultimately underwent a diverting loop colostomy on 2/28. FOBT was positive but she was not noted to have any active bleeding. Inpatient colonoscopy was not recommended. Hospitalization was complicated by hypotension secondary to hypovolemia and adrenal insufficiency/ crisis treated with stress dose steroid and Midodrine. He was also noted to have a run of SVT during admission. She was discharged to CIR on 07/20/2023.  Cardiology was consulted today for further evaluation/ management of tachycardia. Per review of chart, patient has been tachycardia with rates in the 100s to 130s. EKG reveal sinus tachycardia dating back to 04/2023. She states she can feel her heart beating fast. However, her bigger concern is chest pain. She reports chest pain and left shoulder pain that she states has really been going on since all of her GI issues started. She does state it seem to be worse when working with PT and does state she had improvement with sublingual Nitroglycerin this morning. However, when I ask her to point to where it hurts, she point to more of her epigastric area with radiation to her left upper quadrant. Pain to epigastric are is reproducible with palpation. She also reports intermittent dyspnea and worsening lower extremity edema since  most of her cardiac medications have been held. She has chronic stable orthopnea. No PND. She notes occasional mild lightheadedness but no syncope.   Past  Medical History:  Diagnosis Date   Acute encephalopathy 03/30/2021   11/12 - 04/03/2021 acute encephalopathy in the context of altered mental status and poor oral intake following increasing pain medicines and associated Klebsiella UTI.     Acute kidney injury superimposed on chronic kidney disease (HCC) 04/17/2015   11/12 - 04/03/2021 AKI with creatinine of 2.22 and GFR 22 indicating stage IV renal disease in the context of encephalopathy secondary to increased pain medications with decreased oral intake of fluids and nutrition.     Acute on chronic diastolic (congestive) heart failure (HCC)    Anemia    iron deficiency anemia - secondary to blood loss ( chronic)    Anxiety    Arthritis    endstage changes bilateral knees/bilateral ankles.    Asthma    Carotid artery occlusion    Chest pain of uncertain etiology 01/28/2012   Normal coronaries at cath     Chronic fatigue    Chronic kidney disease    Closed intertrochanteric fracture of hip, left, initial encounter (HCC) 11/29/2016   Closed left hip fracture (HCC)    Closed nondisplaced intertrochanteric fracture of left femur with delayed healing 01/14/2017   Clotting disorder (HCC)    pt denies this   Contusion of left knee    due to fall 1/14.   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     Morbid obesity (HCC)    Neuromuscular disorder (HCC)    diabetic neuropathy    Normal coronary arteries    cardiac catheterization  performed  10/31/14   Pneumonia    RA (rheumatoid arthritis) (HCC)    has been off methotreaxte since 10/13.   Septic shock (HCC) 06/13/2023   Spinal stenosis of lumbar region    Thyroid disease    Unspecified lack of coordination    URI (upper respiratory infection)    UTI (urinary tract infection) 03/31/2021   11/12 - 04/03/2021 Klebsiella UTI associated with encephalopathy in the context of AKI.  Status post IV ceftriaxone followed by oral Keflex x48 hours.      Past Surgical History:  Procedure Laterality Date   BRAIN SURGERY     Gamma knife 10/13. Needs repeat spring  '14   CARDIAC CATHETERIZATION N/A 10/31/2014   Procedure: Right/Left Heart Cath and Coronary Angiography;  Surgeon: Corky Crafts, MD;  Location[facility] INVAS[facility] CV LAB;  Service: Cardiovascular;  Laterality: N/A;   COLONOSCOPY WITH PROPOFOL N/A 04/14/2022   Procedure: COLONOSCOPY WITH PROPOFOL;  Surgeon: Beverley Fiedler, MD;  Location: [facility] ENDOSCOPY;  Service: Gastroenterology;  Laterality: N/A;   CYST EXCISION     2 on face   CYST REMOVAL NECK     ESOPHAGOGASTRODUODENOSCOPY (EGD) WITH PROPOFOL  N/A 09/14/2014   Procedure: ESOPHAGOGASTRODUODENOSCOPY (EGD) WITH PROPOFOL;  Surgeon: Louis Meckel, MD;  Location: [facility] ENDOSCOPY;  Service: Endoscopy;  Laterality: N/A;   INCISION AND DRAINAGE HIP Left 01/16/2017   Procedure: IRRIGATION AND DEBRIDEMENT LEFT HIP;  Surgeon: Kathryne Hitch, MD;  Location: [facility] ORS;  Service: Orthopedics;  Laterality: Left;   INTRAMEDULLARY (IM) NAIL INTERTROCHANTERIC Left 11/29/2016   Procedure: INTRAMEDULLARY (IM) NAIL INTERTROCHANTRIC;  Surgeon: Kathryne Hitch, MD;  Location: [facility] OR;  Service: Orthopedics;  Laterality: Left;   LAPAROSCOPIC LOOP COLOSTOMY N/A 07/17/2023   Procedure: LAPAROSCOPIC DIVERTING LOOP COLOSTOMY;  Surgeon: Manus Rudd, MD;  Location: [facility] OR;  Service: General;  Laterality: N/A;   OVARY SURGERY     RIGHT HEART CATH N/A 08/12/2021   Procedure: RIGHT  HEART CATH;  Surgeon: Runell Gess, MD;  Location: [facility] INVASIVE CV LAB;  Service: Cardiovascular;  Laterality: N/A;   SHOULDER SURGERY Left    TONSILLECTOMY  age 8   VAGINAL HYSTERECTOMY     VIDEO BRONCHOSCOPY Bilateral 05/31/2013   Procedure: VIDEO BRONCHOSCOPY WITHOUT FLUORO;  Surgeon: Kalman Shan, MD;  Location: [facility] ENDOSCOPY;  Service: Cardiopulmonary;  Laterality: Bilateral;   video bronscoscopy  05/19/1998   lung     Home Medications:  Prior to Admission medications   Medication Sig Start Date End Date Taking? Authorizing Provider  albuterol (VENTOLIN HFA) 108 (90 Base) MCG/ACT inhaler Inhale 2 puffs into the lungs as needed for wheezing or shortness of breath.    [provider]  Artificial Tear Ointment (DRY EYES OP) Place 2 drops into both eyes as needed (dry eyes).    [provider]  atorvastatin (LIPITOR) 10 MG tablet Take 1 tablet (10 mg total) by mouth daily. 08/14/22   Joan Saint, MD  azithromycin (ZITHROMAX) 250 MG tablet Take 1 tablet (250 mg total) by mouth every Monday, Wednesday, and Friday. Take every Monday, Wednesday, Friday. 06/03/23 06/02/24  Kalman Shan, MD  Calcium Carb-Cholecalciferol (CALCIUM + D3 PO) Take 2 tablets by mouth daily.    [provider]  Camphor-Menthol-Methyl Sal (SALONPAS EX) Apply 1 patch topically as needed (shoulder and leg pain).    [provider]  Cholecalciferol (VITAMIN D3) 50 MCG (2000 UT) capsule Take 2,000 Units by mouth daily.    [provider]  diclofenac Sodium (VOLTAREN) 1 % GEL Apply 2 g topically 3 (three) times daily. Apply to both hands and knees Patient taking differently: Apply 2 g topically in the morning, at noon, and at bedtime. Apply to both hands and knees. 05/03/21   Medina-Vargas, Monina C, NP  dicyclomine (BENTYL) 20 MG tablet Take 1 tablet (20 mg total) by mouth 4 (four) times daily -  before meals and at bedtime. 07/10/23   Tresa Moore, MD   diphenoxylate-atropine (LOMOTIL) 2.5-0.025 MG tablet Take 1 tablet by mouth 4 (four) times daily as needed for diarrhea or loose stools. 07/09/23   Tresa Moore, MD  DULoxetine (CYMBALTA) 60 MG capsule Take 1 capsule (60 mg total) by mouth daily. 11/27/22   Jones Bales, NP  enoxaparin (LOVENOX) 40 MG/0.4ML injection Inject 0.4 mLs (40 mg total) into the skin daily. 07/21/23   Ghimire, Werner Lean, MD  ferrous sulfate 324 MG TBEC Take 324 mg by mouth every evening.    [provider]  fluticasone (FLONASE) 50 MCG/ACT nasal spray Place 1 spray into both nostrils daily. 11/28/22   Joan Saint, MD  furosemide (LASIX) 20 MG tablet Take  1 tablet (20 mg total) by mouth daily. 07/20/23   Ghimire, Werner Lean, MD  gabapentin (NEURONTIN) 600 MG tablet Take 0.5 tablets (300 mg total) by mouth 2 (two) times daily. 06/26/23   Elgergawy, Leana Roe, MD  guaiFENesin (MUCINEX) 600 MG 12 hr tablet Take 2 tablets (1,200 mg total) by mouth 2 (two) times daily. 05/03/21   Medina-Vargas, Monina C, NP  hydroxychloroquine (PLAQUENIL) 200 MG tablet Take 1 tablet (200 mg total) by mouth daily. 07/22/23   Ghimire, Werner Lean, MD  leflunomide (ARAVA) 20 MG tablet Take 1 tablet (20 mg total) by mouth daily. 07/22/23 08/21/23  Ghimire, Werner Lean, MD  levothyroxine (SYNTHROID) 50 MCG tablet Take 1 tablet (50 mcg total) by mouth daily. 11/28/22   Joan Saint, MD  lidocaine (LIDODERM) 5 % Place 1 patch onto the skin daily. Remove & Discard patch within 12 hours or as directed by MD 12/24/22   Joan Saint, MD  linaclotide (LINZESS) 72 MCG capsule Take 72 mcg by mouth daily before breakfast.    [provider]  metolazone (ZAROXOLYN) 2.5 MG tablet TAKE 1 TABLET BY MOUTH DAILY AS NEEDED FOR 2 DAYS FOR WEIGHT ABOVE 255 POUNDS 05/03/21   Medina-Vargas, Monina C, NP  metoprolol succinate (TOPROL-XL) 50 MG 24 hr tablet Take 1 tablet (50 mg total) by mouth daily. Take with or immediately following a meal. 07/10/23    Sreenath, Beverly Gust B, MD  morphine (MS CONTIN) 15 MG 12 hr tablet Take 1 tablet (15 mg total) by mouth every 12 (twelve) hours. SNF use only 07/09/23   Lolita Patella B, MD  Multiple Vitamin (MULTIVITAMIN WITH MINERALS) TABS Take 1 tablet by mouth daily.     [provider]  ondansetron (ZOFRAN-ODT) 4 MG disintegrating tablet Take 1 tablet (4 mg total) by mouth every 8 (eight) hours as needed for nausea or vomiting. 06/12/23   Joan Saint, MD  oxyCODONE (OXY IR/ROXICODONE) 5 MG immediate release tablet Take 1 tablet (5 mg total) by mouth every 6 (six) hours as needed for moderate pain (pain score 4-6). SNF use only 07/09/23   Lolita Patella B, MD  OXYGEN Inhale 3 L into the lungs See admin instructions. 3 L at bedtime, and 3 L during the day as needed for exertion    [provider]  pantoprazole (PROTONIX) 40 MG tablet Take 1 tablet (40 mg total) by mouth 2 (two) times daily. 06/23/23   Joan Saint, MD  polyethylene glycol (MIRALAX / GLYCOLAX) 17 g packet Take 17 g by mouth 2 (two) times daily. Hold for diarrhea 06/26/23   Elgergawy, Leana Roe, MD  Potassium Chloride ER 20 MEQ TBCR Please take 20 mg p.o. twice daily for first 3 days, then go back to 20 mg oral daily, monitor frequently and adjust as needed Patient taking differently: Take 20 mEq by mouth daily. Please take 20 mg p.o. twice daily for first 3 days, then go back to 20 mg oral daily, monitor frequently and adjust as needed 06/26/23   Elgergawy, Leana Roe, MD  predniSONE (DELTASONE) 10 MG tablet Take 1 tablet (10 mg total) by mouth daily with breakfast. 05/22/21   Joan Saint, MD  simethicone (MYLICON) 80 MG chewable tablet Chew 1 tablet (80 mg total) by mouth every 6 (six) hours as needed for flatulence. 06/26/23   Elgergawy, Leana Roe, MD  zinc oxide 20 % ointment Apply 1 Application topically See admin instructions. Every shift. Apply to MASD to left buttocks  and with each incontinent episode.    [provider]    Inpatient Medications: Scheduled Meds:  atorvastatin  10 mg Oral Daily   azithromycin  250 mg Oral Q M,W,F-2000   diclofenac Sodium  2 g Topical TID   DULoxetine  60 mg Oral Daily   enoxaparin (LOVENOX) injection  40 mg Subcutaneous Q24H   [START ON 07/24/2023] furosemide  40 mg Oral Daily   gabapentin  300 mg Oral BID   guaiFENesin  1,200 mg Oral BID   hydrocerin   Topical BID   hydroxychloroquine  200 mg Oral Daily   levothyroxine  50 mcg Oral Daily   lidocaine  1 patch Transdermal Q24H   magnesium gluconate  250 mg Oral QHS   metoprolol succinate  75 mg Oral Daily   morphine  15 mg Oral Q12H   pantoprazole  40 mg Oral BID   potassium chloride  40 mEq Oral BID   predniSONE  10 mg Oral Q breakfast   Continuous Infusions:  potassium chloride 10 mEq (07/23/23 1312)   PRN Meds: acetaminophen **OR** acetaminophen, albuterol, hydrALAZINE, methocarbamol, morphine, nitroGLYCERIN, witch hazel-glycerin  Allergies:    Allergies  Allergen Reactions   Codeine Swelling and Other (See Comments)    Facial swelling, Chest pain, and swelling in legs    Infliximab Anaphylaxis    (REMICADE) "sent me into shock"    Lisinopril Swelling and Rash    Face and neck swelling    Ofev [Nintedanib] Diarrhea    Social History:   Social History   Socioeconomic History   Marital status: Married    Spouse name: Not on file   Number of children: 6   Years of education: college   Highest education level: Bachelor's degree (e.g., BA, AB, BS)  Occupational History   Occupation: retired Engineer, civil (consulting)  Tobacco Use   Smoking status: Never   Smokeless tobacco: Never  Vaping Use   Vaping status: Never Used  Substance and Sexual Activity   Alcohol use: No   Drug use: No   Sexual activity: Not Currently  Other Topics Concern   Not on file  Social History Narrative   Patient consumes 2-3 cups coffee per day.   Social Drivers of Corporate investment banker Strain: Low Risk   (06/11/2023)   Overall Financial Resource Strain (CARDIA)    Difficulty of Paying Living Expenses: Not hard at all  Food Insecurity: No Food Insecurity (07/16/2023)   Hunger Vital Sign    Worried About Running Out of Food in the Last Year: Never true    Ran Out of Food in the Last Year: Never true  Transportation Needs: No Transportation Needs (07/16/2023)   PRAPARE - Administrator, Civil Service (Medical): No    Lack of Transportation (Non-Medical): No  Physical Activity: Inactive (06/11/2023)   Exercise Vital Sign    Days of Exercise per Week: 0 days    Minutes of Exercise per Session: 20 min  Stress: No Stress Concern Present (10/27/2022)   [employer] of Occupational Health - Occupational Stress Questionnaire    Feeling of Stress : Not at all  Social Connections: Socially Integrated (07/16/2023)   Social Connection and Isolation Panel [NHANES]    Frequency of Communication with Friends and Family: More than three times a week    Frequency of Social Gatherings with Friends and Family: More than three times a week    Attends Religious Services: More than 4 times per year  Active Member of Clubs or Organizations: Yes    Attends Banker Meetings: More than 4 times per year    Marital Status: Married  Catering manager Violence: Not At Risk (07/16/2023)   Humiliation, Afraid, Rape, and Kick questionnaire    Fear of Current or Ex-Partner: No    Emotionally Abused: No    Physically Abused: No    Sexually Abused: No    Family History:    Family History  Problem Relation Age of Onset   Diabetes Mother    Heart attack Mother    Hypertension Father    Lung cancer Father    Diabetes Sister    Breast cancer Sister    Breast cancer Sister    Diabetes Brother    Hypertension Brother    Heart disease Brother    Heart attack Brother    Kidney cancer Brother    Gout Brother    Kidney failure Brother        x 5   Esophageal cancer Brother    Stomach  cancer Brother    Prostate cancer Brother    Uterine cancer Daughter        with mets   Fibroids Daughter    Hypertension Daughter    Hypertension Daughter    Hypertension Son    Heart murmur Son    Rheum arthritis Maternal Uncle    Alzheimer's disease Neg Hx    Dementia Neg Hx      ROS:  Please see the history of present illness.     Physical Exam/Data:   Vitals:   07/23/23 1011 07/23/23 1217 07/23/23 1328 07/23/23 1331  BP: (!) 159/94 (!) 146/91 137/79   Pulse: (!) 106 95 (!) 134 (!) 139  Resp:  14 16   Temp:  98.4 F (36.9 C)    TempSrc:  Oral    SpO2:  96% 95%   Weight:        Intake/Output Summary (Last 24 hours) at 07/23/2023 1404 Last data filed at 07/23/2023 1212 Gross per 24 hour  Intake 712 ml  Output 350 ml  Net 362 ml      07/23/2023    8:12 AM 07/23/2023    5:01 AM 07/22/2023    6:38 AM  Last 3 Weights  Weight (lbs) 218 lb 4.1 oz 195 lb 15.8 oz 197 lb 15.6 oz  Weight (kg) 99 kg 88.9 kg 89.8 kg     Body mass index is 38.66 kg/m.  General: 83 y.o. female resting comfortably in no acute distress. HEENT: Normocephalic and atraumatic. Sclera clear.  Neck: Supple.[name] No JVD. Heart: Tachycardic with normal rhythm.  No murmurs, gallops, or rubs.  Lungs: No increased work of breathing. Clear to ausculation bilaterally. No significant wheezes, rhonchi, or rales.  Abdomen: Soft, non-distended, and non-tender to palpation.  Extremities: 1+ pitting edema of bilateral lower extremities (worse around ankles). Skin: Warm and dry. Neuro: Alert and oriented x3. No focal deficits. Psych: Normal affect. Responds appropriately.   EKG:  The EKG from today was personally reviewed and demonstrates:  Sinus tachycardia, rate 103 bpm, with nonspecific ST/ T wave changes. Telemetry:  Not on telemetry in CIR.  Relevant CV Studies:  Right Cardiac Catheterization 08/12/2021: Hemodynamics:  1: Right atrial pressure-20/18 2: Right ventricular pressure-37/17, mean 22 3: Pulmonary  artery pressure-43/19, mean 30 4: Pulmonary wedge pressure-A-wave 23, V wave 20, mean 22 5: Cardiac output-4.97 L/min with an index of 2.43 L/min/m 6: PVR-1.81    Impression:  Ms. Beane has moderately elevated right atrial pressures and mildly elevated pulmonary artery pressures.  Her PA saturations were 58%.  The sheath was removed and a bandage was placed over the right antecubital puncture site.  The patient left the lab in stable condition.  She will be discharged home later this morning and to follow-up with Dr. Marchelle Gearing and myself. _______________  Echocardiogram 06/15/2023: Impressions: 1. Left ventricular ejection fraction, by estimation, is 60 to 65%. The  left ventricle has normal function. The left ventricle has no regional  wall motion abnormalities. Left ventricular diastolic parameters are  consistent with Grade I diastolic  dysfunction (impaired relaxation).   2. Right ventricular systolic function is normal. The right ventricular  size is normal. Tricuspid regurgitation signal is inadequate for assessing  PA pressure.   3. The mitral valve is normal in structure. No evidence of mitral valve  regurgitation. No evidence of mitral stenosis.   4. The aortic valve is tricuspid. There is mild calcification of the  aortic valve. Aortic valve regurgitation is not visualized. No aortic  stenosis is present.   5. IVC not visualized.   6. Exam truncated due to patient noncompliance.    Laboratory Data:  High Sensitivity Troponin:   Recent Labs  Lab 07/23/23 0907 07/23/23 1159  TROPONINIHS 32* 28*     Chemistry Recent Labs  Lab 07/17/23 1037 07/18/23 0534 07/19/23 0531 07/20/23 0444 07/20/23 1817 07/21/23 1014 07/23/23 0509  NA 141 141   < > 141  --  143 140  K 3.2* 2.9*   < > 4.3  --  3.3* 2.8*  CL 105 107   < > 109  --  109 110  CO2 24 21*   < > 26  --  25 27  GLUCOSE 73 136*   < > 109*  --  96 89  BUN 14 11   < > 8  --  7* 5*  CREATININE 0.67 0.62   < >  0.66 0.61 0.56 0.59  CALCIUM 7.9* 7.9*   < > 7.8*  --  7.8* 7.1*  MG 1.8 1.7  --  1.8  --   --   --   GFRNONAA >60 >60   < > >60 >60 >60 >60  ANIONGAP 12 13   < > 6  --  9 3*   < > = values in this interval not displayed.    Recent Labs  Lab 07/17/23 0750 07/21/23 1014  PROT  --  4.1*  ALBUMIN 2.1* 2.2*  AST  --  18  ALT  --  12  ALKPHOS  --  51  BILITOT  --  0.4   Lipids No results for input(s): "CHOL", "TRIG", "HDL", "LABVLDL", "LDLCALC", "CHOLHDL" in the last 168 hours.  Hematology Recent Labs  Lab 07/20/23 1817 07/21/23 1014 07/22/23 1343  WBC 7.6 7.3 9.3  RBC 3.59* 3.23* 3.40*  HGB 10.7* 9.7* 10.2*  HCT 34.9* 30.8* 32.9*  MCV 97.2 95.4 96.8  MCH 29.8 30.0 30.0  MCHC 30.7 31.5 31.0  RDW 15.9* 16.1* 16.3*  PLT 170 158 154   Thyroid No results for input(s): "TSH", "FREET4" in the last 168 hours.  BNPNo results for input(s): "BNP", "PROBNP" in the last 168 hours.  DDimer No results for input(s): "DDIMER" in the last 168 hours.   Radiology/Studies:  No results found.   Assessment and Plan:   Sinus Tachycardia Patient currently in CIR after multiple admission over the last couple of months.  Cardiology consulted today for tachycardia. EKG shows sinus tachycardia, rate 103 bpm, with non-specific T wave changes.  - Tachycardic with rates in the 100s to 130s. There is no telemetry while in CIR. Every EKG since 04/2023 has shown sinus tachycardia. There was mention of an episode of SVT during most recent inpatient but I am unable to finding any strips from then. - Potassium 2.8 today. Being repleted by primary team. - TSH normal in 05/2023 and 06/2023. - Will check Magnesium. - Currently on Toprol-XL 75mg  daily (previously on 125mg  daily at home based on last office visit note in 04/2023). Will stop Toprol-XL and switch to Lopressor 50mg  twice daily. Can transition back to Toprol-XL prior to discharge once better rate control as been achieved.  Chest Pain Elevated  Troponin Non-Obstructive CAD Patient has history of atypical chest pain per chart review. LHC in 2016 showed  non-obstructive CAD. She reports chest pain with radiation to shoulder that has been intermittent for last month or so with all her GI issues. She does think it is worse when working with PT and did have improvement with sublingual Nitro earlier today. EKG shows non-specific T wave changes. High-sensitivity troponin minimally elevated and flat at 32 >> 28 not consistent with ACS.  - She reports she is still having a little chest pain right now. However, when asked to point to this area, she points to more of her epigastric area. Pain is reproducible with palpation.  - I have a low suspicion that epigastric/ chest pain is cardiac in nature right now. It did improve some with Nitro this morning but the fact that it is reproducible on exam suggests it is not cardiac. Troponin elevation is not consistent with ACS.  If she continues to have pain as she recovers from GI surgery, could consider ischemic evaluation at that time.    Chronic HFpEF Last Echo in 05/2023 showed LVEF 60-65% with normal wall motion and grade 1 diastolic dysfunction, normal RV function, and no significant valvular disease.  - She does have some lower extremity edema on exam but otherwise does not appear significantly volume overloaded. Weight is 218 lbs today but I don't think this is accurate as she weighed 195 lbs and 197 lbs the prior 2 days. She is down from 240 lbs in the office in 04/2023.  - She was restarted on PO Lasix 40mg  daily today. Agree with this. She was previously on 80mg  daily back in 04/2023; however, she had issues with hypotension during most recent inpatient admission so will start slow.  - Consider adding Spironolactone 12.5mg  daily.   Hypertension BP elevated. She did have some hypotension during most recent admission (on inpatient side) which was felt to be due to hypovolemia and adrenal insufficiency  given chronic steroid use. She was treated with stress dose steroids and a couple of days of Midodrine. However, she has not had any hypotension in several days now.  - Per last office visit in 04/2023, antihypertensive regiment included Olmesartan 40mg  daily, Toprol-XL 125mg  daily, and Hydralazine 25mg  twice daily. However, multiple changes to her medications have been made during recent hospitalization. She was discharged to CIR only on Toprol-XL 50mg  daily.  - Will stop Toprol-XL and switch to Lopressor 50mg  twice daily. Can switch back to Toprol-XL once we have achieved better rate control.  - Considering adding Spironolactone 12.5mg  daily to help with lower extremity edema and hypokalemia.  - Of note, she does have a history of syncope felt to be due  to overmedication and dehydration in setting of poor intake so I would add antihypertensives slowly.   Hypokalemia Potassium 2.8 >> 3.4 today. - Being repleted by primary team. - Continue to monitor closely.   Otherwise, per primary team.  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 However, functional status is primarily limited by severe deconditioning after multiple admissions recently.   For questions or updates, please contact [facility] Please consult [email or web address] for contact info under    Signed, Corrin Parker, PA-C  07/23/2023 2:04 PM  Personally seen and examined. Agree with above.  83 year old currently in rehab following diverting loop colostomy chronic anemia memory impairment being seen for the evaluation of tachycardia at the request of [name].  She was previously on higher  dose metoprolol which at 1 point was stopped secondary to adrenal crisis.  Currently is comfortable in bed.  Pleasant.  Heart rate has ranged from 10 6-1 39 with what appears to be sinus tachycardia on EKG.  While auscultating her, she was approximately in the 120 range.  No chest pain.  Troponins minimally elevated 32 and 28 flat.  Not ACS.  Potassium 3.2/2.9.  Hemoglobin 10.2.  Assessment and plan  Sinus tachycardia - Appears to be sinus tachycardia.  We will go ahead and continue encourage potassium repletion, TSH is normal, checking magnesium, currently on Toprol-XL which was just recently increased to 75 mg a day but previously on 125 mg daily based upon last office note December 2024 reviewed. - We will go ahead and stop the Toprol-XL and apply Lopressor 50 mg twice a day to help quicken titration.  Closely monitor blood pressure as previously she did have evidence of hypotension in the past.  Hypertension/chronic diastolic heart failure - We will go ahead and add spironolactone 12.5 mg a day which also should help with her hypokalemia. -Recent EF 65%.  No need for repeat echocardiogram. -Agree with restarting Lasix 40 mg a day.  Previously on 80.  Nonobstructive coronary artery disease/chest pain/epigastric pain - Mostly seems to be epigastric in origin reproducible on palpation.  She has had GI surgery etc.  Troponin not ACS.  We will follow tomorrow.  Donato Schultz, MD

## 2023-07-23 NOTE — Progress Notes (Signed)
 Inpatient Rehabilitation Care Coordinator Assessment and Plan Patient Details  Name: [name] MRN: [id number] Date of Birth: 1940/07/30  Today's Date: 07/23/2023  Hospital Problems: Principal Problem:   Debility Active Problems:   Hypothyroidism   Septic shock (HCC)   Type 2 diabetes mellitus with diabetic polyneuropathy, without long-term current use of insulin (HCC)   Iron deficiency anemia   Hyperlipidemia   Chronic diastolic congestive heart failure (HCC)  Past Medical History:  Past Medical History:  Diagnosis Date   Acute encephalopathy 03/30/2021   11/12 - 04/03/2021 acute encephalopathy in the context of altered mental status and poor oral intake following increasing pain medicines and associated Klebsiella UTI.     Acute kidney injury superimposed on chronic kidney disease (HCC) 04/17/2015   11/12 - 04/03/2021 AKI with creatinine of 2.22 and GFR 22 indicating stage IV renal disease in the context of encephalopathy secondary to increased pain medications with decreased oral intake of fluids and nutrition.     Acute on chronic diastolic (congestive) heart failure (HCC)    Anemia    iron deficiency anemia - secondary to blood loss ( chronic)    Anxiety    Arthritis    endstage changes bilateral knees/bilateral ankles.    Asthma    Carotid artery occlusion    Chest pain of uncertain etiology 01/28/2012   Normal coronaries at cath     Chronic fatigue    Chronic kidney disease    Closed intertrochanteric fracture of hip, left, initial encounter (HCC) 11/29/2016   Closed left hip fracture (HCC)    Closed nondisplaced intertrochanteric fracture of left femur with delayed healing 01/14/2017   Clotting disorder (HCC)    pt denies this   Contusion of left knee    due to fall 1/14.   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     Morbid obesity (HCC)    Neuromuscular disorder (HCC)    diabetic neuropathy    Normal coronary arteries    cardiac catheterization performed  10/31/14   Pneumonia    RA (rheumatoid arthritis) (HCC)    has been off methotreaxte since 10/13.   Septic shock (HCC) 06/13/2023   Spinal stenosis of lumbar region    Thyroid disease    Unspecified lack of coordination    URI (upper respiratory infection)    UTI (urinary tract infection) 03/31/2021   11/12 - 04/03/2021 Klebsiella UTI associated with encephalopathy in the context of AKI.  Status post IV ceftriaxone followed by oral Keflex x48 hours.     Past Surgical History:  Past Surgical History:  Procedure Laterality Date   BRAIN SURGERY     Gamma knife 10/13. Needs repeat spring  '14   CARDIAC CATHETERIZATION N/A 10/31/2014   Procedure: Right/Left Heart Cath and Coronary Angiography;  Surgeon: Corky Crafts, MD;  Location: [facility] INVASIVE CV LAB;  Service: Cardiovascular;  Laterality: N/A;   COLONOSCOPY WITH PROPOFOL N/A 04/14/2022   Procedure: COLONOSCOPY WITH PROPOFOL;  Surgeon: Beverley Fiedler, MD;  Location: [facility] ENDOSCOPY;  Service: Gastroenterology;  Laterality: N/A;   CYST EXCISION     2 on face   CYST REMOVAL NECK     ESOPHAGOGASTRODUODENOSCOPY (EGD) WITH PROPOFOL N/A 09/14/2014   Procedure: ESOPHAGOGASTRODUODENOSCOPY (EGD) WITH PROPOFOL;  Surgeon: Louis Meckel, MD;  Location: [facility] ENDOSCOPY;  Service: Endoscopy;  Laterality: N/A;   INCISION AND DRAINAGE HIP Left 01/16/2017   Procedure: IRRIGATION AND DEBRIDEMENT LEFT HIP;  Surgeon: Kathryne Hitch, MD;   Location: [facility] ORS;  Service: Orthopedics;  Laterality: Left;   INTRAMEDULLARY (IM) NAIL INTERTROCHANTERIC Left 11/29/2016   Procedure: INTRAMEDULLARY (IM) NAIL INTERTROCHANTRIC;  Surgeon: Kathryne Hitch, MD;  Location: [facility] OR;  Service: Orthopedics;  Laterality: Left;   LAPAROSCOPIC LOOP COLOSTOMY N/A 07/17/2023   Procedure: LAPAROSCOPIC DIVERTING LOOP COLOSTOMY;  Surgeon: Manus Rudd, MD;  Location: [facility] OR;  Service: General;  Laterality: N/A;   OVARY SURGERY     RIGHT HEART CATH N/A 08/12/2021   Procedure: RIGHT HEART CATH;  Surgeon: Runell Gess, MD;  Location: [facility] INVASIVE CV LAB;  Service: Cardiovascular;  Laterality: N/A;   SHOULDER SURGERY Left    TONSILLECTOMY  age 47   VAGINAL HYSTERECTOMY     VIDEO BRONCHOSCOPY Bilateral 05/31/2013   Procedure: VIDEO BRONCHOSCOPY WITHOUT FLUORO;  Surgeon: Kalman Shan, MD;  Location: [facility] ENDOSCOPY;  Service: Cardiopulmonary;  Laterality: Bilateral;   video bronscoscopy  05/19/1998   lung   Social History:  reports that she has never smoked. She has never used smokeless tobacco. She reports that she does not drink alcohol and does not use drugs.  Family / Support Systems Marital Status: Married How Long?: 12 years (second marriage) Patient Roles: Spouse, Parent Spouse/Significant Other: samuel (husband) Children: 6 children; 5 living. Lyda Jester (lives in the home), Everlean Alstrom (works 3rd shift), Casimer Bilis (works 2nd shift), Gala Murdoch (works remote-days), and Ambulance person (lives in [place]). Mick Sell (deceased). Other Supports: son curtis Anticipated Caregiver: Son curtis; and supervision from husbnad Ability/Limitations of Caregiver: Pt lives with her husband, and their son Lyda Jester moved into thehome to help assist. He workds 3rd shift. Family is working on a plan of care. Pt husband currenlty has an aide to assist. Caregiver Availability: Other (Comment) (TBD) Family Dynamics: Pt lives with her husband, and their son Lyda Jester moved into thehome to help  assist.  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 Reports has documents and will need to locate.   Abuse/Neglect Abuse/Neglect Assessment Can Be Completed: Yes Physical Abuse: Denies Verbal Abuse: Denies Sexual Abuse: Denies Exploitation of patient/patient's resources: Denies Self-Neglect: Denies  Patient response to: Social Isolation - How often do you feel lonely or isolated from those around you?: Never  Emotional Status Pt's affect, behavior and adjustment status: Pt in good spirits at time of visit Recent Psychosocial Issues: Pt reports she is adjusting to having an ostomy, and wants to learn how to manage. Psychiatric History: Denies Substance Abuse History: Denies  Patient / Family Perceptions, Expectations & Goals Pt/Family understanding of  illness & functional limitations: pt and family have a general understnading of pt care needs Premorbid pt/family roles/activities: Independent Anticipated changes in roles/activities/participation: Assistance with ADLs/IADLs Pt/family expectations/goals: Pt goal is to work on being able to come back around as she was before she got sick where she was unable to do anything for her self, and not walking.  Community Resources [employer]: None Premorbid Home Care/DME Agencies: None Transportation available at discharge: TBD Is the patient able to respond to transportation needs?: Yes In the past 12 months, has lack of transportation kept you from medical appointments or from  getting medications?: No In the past 12 months, has lack of transportation kept you from meetings, work, or from getting things needed for daily living?: No Resource referrals recommended: Neuropsychology  Discharge Planning Living Arrangements: Spouse/significant other, Children Support Systems: Spouse/significant other, Children Type of Residence: Private residence Insurance Resources: [employer] Financial Resources: Restaurant manager, fast food Screen Referred: No Living Expenses: Own Money Management: Patient Does the patient have any problems obtaining your medications?: No Home Management: Patient reports the aide helps with preparing meals (breakfast and lunch), and their son Lyda Jester helps at times as well. Reports their son Lyda Jester does the laundry, and aide helps as well. Patient/Family Preliminary Plans: TBD Care Coordinator Barriers to Discharge: Decreased caregiver support, Lack of/limited family support, Other (comments) Care Coordinator Barriers to Discharge Comments: new ostomy Care Coordinator Anticipated Follow Up Needs: HH/OP Expected length of stay: D/c 3/18  Clinical Impression SW met with pt at bed side. Pt is not a veteran, however, gets VA benefits from her first husband. She states she is eligible for medication through [facility], and gets widows benefits. She was encouraged to contact local VA to see if she is eligible for Aide and Attendance program for her as his widow. DME- rollator (in room)w/c (purchased privately), and shower chair with back.   Nalani Andreen A Toriana Sponsel 07/23/2023, 2:05 PM

## 2023-07-23 NOTE — Progress Notes (Signed)
 Has had issues with IV and first run infusing. Did get 40 mg lasix this morning and daily dose resumed. Will likely need extra doses of potassium for adequate supplementation as was very low this am. She has had persistent issue with low potassium for the past week and recent colon surgery which will likely affect absorption. Repeat BMET this afternoon  as well as 40 meq bid oral doses. Will check BMET daily for a few days to make sure she is adequately supplemented.

## 2023-07-23 NOTE — Plan of Care (Signed)
  Problem: RH BOWEL ELIMINATION Goal: RH STG MANAGE BOWEL WITH ASSISTANCE Description: STG Manage Bowel with min Assistance. Outcome: Progressing   Problem: RH BLADDER ELIMINATION Goal: RH STG MANAGE BLADDER WITH ASSISTANCE Description: STG Manage Bladder With toileting Assistance Outcome: Progressing   Problem: RH SKIN INTEGRITY Goal: RH STG ABLE TO PERFORM INCISION/WOUND CARE W/ASSISTANCE Description: STG Able To Perform Incision/Wound Care With min Assistance. Outcome: Progressing   Problem: RH SAFETY Goal: RH STG ADHERE TO SAFETY PRECAUTIONS W/ASSISTANCE/DEVICE Description: STG Adhere to Safety Precautions With cues Assistance/Device. Outcome: Progressing

## 2023-07-23 NOTE — Progress Notes (Signed)
 BMET done. Potasium level 2.8. Dan A. (PA) notified

## 2023-07-23 NOTE — Progress Notes (Signed)
 Physical Therapy Session Note  Patient Details  Name: [name] MRN: [id number] Date of Birth: 10-Nov-1940  Today's Date: 07/23/2023 PT Individual Time: 1045-1150 PT Individual Time Calculation (min): 65 min  and Today's Date: 07/23/2023 PT Missed Time: 30 Minutes Missed Time Reason: Patient unwilling to participate  Short Term Goals: Week 1:  PT Short Term Goal 1 (Week 1): pt will perform bed mobility with min A consistantly PT Short Term Goal 2 (Week 1): pt will transfer bed<>chair with LRAD and CGA PT Short Term Goal 3 (Week 1): pt will ambulate 54ft with LRAD and CGA  Skilled Therapeutic Interventions/Progress Updates:    Pt seen at scheduled time with nsg present in the room d/t chest pain, who administered nitroglycerin. After discussion with nsg about pt's current condition, pt agreeable to bed level therapy at this time.   Pt performed the following exercises to promote LE strength and endurance:  -ankle pumps x 20 -hip internal and external rotation  -heel slides (<20 deg on LLE, <30 deg on RLE) x20  -small range SLR x 10  At this time, vitals taken and medication administered by nsg: BP= 170/123, HR =99  Extended rest break d/t high BP and to allow medication time to work. After ~5 min, BP=145/92 (107), HR=96 and pt reports improvement in chest pain.   Repeated exercises as listed above for strength and mobility and to ensure vitals stable. BP=138/87(102), HR=94  During rest breaks, discussed home set up and d/c plans. Pt states she is open to going to a facility if needed but would like to go home. Pt with questions about activity levels as home. Discussed concept of progressive overload for strength gains. Pt states she has exercise routine, but has not changed it. Discussed ways of increasing intensity once she is feeling better (adding more walking, performing supine/sitting exercises after her self care routine instead of later in the day), and that follow up  therapies will help her navigate return to PLOF.   Pt missed x 10 min at end of session d/t extended time for lab draw, will attempt to make up as able. Pt was left with all needs in reach and alarm active.     Therapy Documentation Precautions:  Precautions Precautions: Fall Precaution/Restrictions Comments: ostomy, monitor O2 and HR Restrictions Weight Bearing Restrictions Per Provider Order: No General: PT Amount of Missed Time (min): 30 Minutes PT Missed Treatment Reason: Patient unwilling to participate    Therapy/Group: Individual Therapy  Juluis Rainier 07/23/2023, 11:05 AM

## 2023-07-23 NOTE — Progress Notes (Signed)
 Checked with Provider about PO Potassium dose with IV infusion Potassium running agreed to continue to give medication. Explained to patient if any symptoms of N/V, shortness of breath, or heart palpations/heart flutter to Pharmacist, hospital or staff know. No chest pain endorsed by patient at this time.

## 2023-07-23 NOTE — Progress Notes (Signed)
 Physical Therapy Session Note  Patient Details  Name: [name] MRN: [id number] Date of Birth: 29-Dec-1940  Today's Date: 07/23/2023 PT Individual Time: 1400-1423 PT Individual Time Calculation (min): 23 min  Today's Date: 07/23/2023 PT Missed Time: 37 Minutes Missed Time Reason: Patient unwilling to participate  Short Term Goals: Week 1:  PT Short Term Goal 1 (Week 1): pt will perform bed mobility with min A consistantly PT Short Term Goal 2 (Week 1): pt will transfer bed<>chair with LRAD and CGA PT Short Term Goal 3 (Week 1): pt will ambulate 52ft with LRAD and CGA  Skilled Therapeutic Interventions/Progress Updates:   Received pt semi-reclined in bed with daughter at bedside. Pt reported chest pain from this morning was "better" but c/o headache - RN notified and present to administer medications. Assessed vitals: supine at rest - HR 133bpm, SPO2 96%, and BP 132/88. Pt reporting fatigue and explaining events from today with no sleep last night. Pt declining OOB mobility due to fatigue, offered bed level exercises, however pt politely refused. Suggested scooting up to [facility] as pt sliding and slouching down. Pt unable to use BUE to pull on headboard due to weakness/pain and "too weak" to use legs - required +2 to scoot to [facility]. Pt requesting to attempt therapy again tomorrow. 37 minutes missed of skilled physical therapy due to unwillingness to participate.   Therapy Documentation Precautions:  Precautions Precautions: Fall Precaution/Restrictions Comments: ostomy, monitor O2 and HR Restrictions Weight Bearing Restrictions Per Provider Order: No  Therapy/Group: Individual Therapy Marlana Salvage Zaunegger Blima Rich PT, DPT 07/23/2023, 6:52 AM

## 2023-07-23 NOTE — Progress Notes (Signed)
 Holding Potassium till 1400 recheck vitals at that time. Provider informed made aware of current symptoms endorsed by patient.

## 2023-07-23 NOTE — Progress Notes (Signed)
 Patient continues to have intermittent tachycardia with HR upto 130 on recent check per nurse. Note patient was hypokalemic on last check of BMET.  Stat BMET ordered to rule out low K as cause of tachycardia.   EKG ordered and reported to be showing HR 102-103 but has not transmitted yet. Also note that BP now trending up after post op issues with hypotension. . Requested recheck of vitals and hydralazine prn added to help manage BP. Daily weights ordered as was no lasix and prn Zaroxolyn  at home. [name]

## 2023-07-23 NOTE — Progress Notes (Signed)
 After attending to patient's ADLS this morning endorsed dull aching pressure in her lower mid chest/diaphragm area. Endorsing pain as a "5". Provider informed and made aware. Given PRN medication for chest pain and Provider ordering an additional one time dose of Lasix 20 mg given as well. Vitals rechecked and improving. Patient endorsing pain is less and dissipating. Made aware about Lasix can lower potassium levels as patient mentioned being on 80 mg prior to hospitlization. Explained Provider ordered IV Potassium medication give at 1200 due to loss of IV access, Pharmacy contacted times adjusted.

## 2023-07-23 NOTE — Progress Notes (Signed)
 Physical Therapy Session Note  Patient Details  Name: [name] MRN: [id number] Date of Birth: Sep 02, 1940  Today's Date: 07/23/2023 PT Missed Time: 30 Minutes Missed Time Reason: Patient unwilling to participate  Pt resting in bed - reports feeling exhausted from getting dressed this morning. She politely refuses PT session despite encouragement. Left in bed with all needs met. Missed 30 minutes.    Therapy/Group: Individual Therapy  [name] [name] 07/23/2023, 8:02 AM

## 2023-07-24 DIAGNOSIS — F411 Generalized anxiety disorder: Secondary | ICD-10-CM | POA: Diagnosis not present

## 2023-07-24 DIAGNOSIS — I5032 Chronic diastolic (congestive) heart failure: Secondary | ICD-10-CM | POA: Diagnosis not present

## 2023-07-24 DIAGNOSIS — R5381 Other malaise: Secondary | ICD-10-CM | POA: Diagnosis not present

## 2023-07-24 DIAGNOSIS — I10 Essential (primary) hypertension: Secondary | ICD-10-CM | POA: Diagnosis not present

## 2023-07-24 DIAGNOSIS — R000 Tachycardia, unspecified: Secondary | ICD-10-CM | POA: Diagnosis not present

## 2023-07-24 LAB — BASIC METABOLIC PANEL
Anion gap: 1 — ABNORMAL LOW (ref 5–15)
BUN: 5 mg/dL — ABNORMAL LOW (ref 8–23)
CO2: 32 mmol/L (ref 22–32)
Calcium: 7.4 mg/dL — ABNORMAL LOW (ref 8.9–10.3)
Chloride: 109 mmol/L (ref 98–111)
Creatinine, Ser: 0.58 mg/dL (ref 0.44–1.00)
GFR, Estimated: >60 mL/min (ref >60)
Glucose, Bld: 90 mg/dL (ref 70–99)
Potassium: 3.2 mmol/L — ABNORMAL LOW (ref 3.5–5.1)
Sodium: 142 mmol/L (ref 135–145)

## 2023-07-24 LAB — MAGNESIUM: Magnesium: 1.5 mg/dL — ABNORMAL LOW (ref 1.7–2.4)

## 2023-07-24 LAB — GLUCOSE, CAPILLARY
Glucose-Capillary: 105 mg/dL — ABNORMAL HIGH (ref 70–99)
Glucose-Capillary: 149 mg/dL — ABNORMAL HIGH (ref 70–99)

## 2023-07-24 MED ORDER — LIDOCAINE 5 % EX PTCH
1.0000 | MEDICATED_PATCH | CUTANEOUS | Status: DC
  Administered 2023-07-24 – 2023-08-03 (×11): 1 via TRANSDERMAL
  Filled 2023-07-24 (×11): qty 1

## 2023-07-24 MED ORDER — MAGNESIUM SULFATE 2 GM/50ML IV SOLN
2.0000 g | Freq: Once | INTRAVENOUS | Status: AC
  Administered 2023-07-24: 2 g via INTRAVENOUS
  Filled 2023-07-24: qty 50

## 2023-07-24 NOTE — Progress Notes (Addendum)
 Patient Name: [name] Date of Encounter: 07/24/2023 [address] [facility] Cardiologist: Nanetta Batty, MD   Interval Summary  .    Feels very good today without any complaints of chest pain.  Started on Lasix yesterday with significant improvement in swelling.  Vital Signs .    Vitals:   07/24/23 0215 07/24/23 0504 07/24/23 0550 07/24/23 0757  BP: 118/81  (!) 145/79 (!) 148/91  Pulse: 87  87 (!) 102  Resp: 18  17   Temp: (!) 97.3 F (36.3 C)  (!) 97 F (36.1 C)   TempSrc: Oral     SpO2: 97%  100% 97%  Weight:  97.3 kg      Intake/Output Summary (Last 24 hours) at 07/24/2023 1018 Last data filed at 07/24/2023 0813 Gross per 24 hour  Intake 671.92 ml  Output 300 ml  Net 371.92 ml      07/24/2023    5:04 AM 07/23/2023    8:12 AM 07/23/2023    5:01 AM  Last 3 Weights  Weight (lbs) 214 lb 8.1 oz 218 lb 4.1 oz 195 lb 15.8 oz  Weight (kg) 97.3 kg 99 kg 88.9 kg      Telemetry/ECG    Not on telemetry and rehab.  EKG with sinus tachycardia no acute ST-T wave changes.- Personally Reviewed  CV Studies    Right Cardiac Catheterization 08/12/2021: Hemodynamics:   1: Right atrial pressure-20/18 2: Right ventricular pressure-37/17, mean 22 3: Pulmonary artery pressure-43/19, mean 30 4: Pulmonary wedge pressure-A-wave 23, V wave 20, mean 22 5: Cardiac output-4.97 L/min with an index of 2.43 L/min/m 6: PVR-1.81    Impression: Ms. Inabinet has moderately elevated right atrial pressures and mildly elevated pulmonary artery pressures.  Her PA saturations were 58%.  The sheath was removed and a bandage was placed over the right antecubital puncture site.  The patient left the lab in stable condition.  She will be discharged home later this morning and to follow-up with Dr. Marchelle Gearing and myself. _______________   Echocardiogram 06/15/2023: Impressions: 1. Left ventricular ejection fraction, by estimation, is 60 to 65%. The  left ventricle has normal function. The left  ventricle has no regional  wall motion abnormalities. Left ventricular diastolic parameters are  consistent with Grade I diastolic  dysfunction (impaired relaxation).   2. Right ventricular systolic function is normal. The right ventricular  size is normal. Tricuspid regurgitation signal is inadequate for assessing  PA pressure.   3. The mitral valve is normal in structure. No evidence of mitral valve  regurgitation. No evidence of mitral stenosis.   4. The aortic valve is tricuspid. There is mild calcification of the  aortic valve. Aortic valve regurgitation is not visualized. No aortic  stenosis is present.   5. IVC not visualized.   6. Exam truncated due to patient noncompliance.   Physical Exam .   GEN: No acute distress.   Neck: No JVD Cardiac: RRR, no murmurs, rubs, or gallops.  Respiratory: Clear to auscultation bilaterally. GI: Soft, nontender, non-distended  MS: No edema  Patient Profile    [name] is a 83 y.o. female has hx of  mild non-obstructive CAD on cardiac catheterization in 10/2014, chronic diastolic CHF, interstitial lung disease on home O2, prior PE/ DVT not anticoagulation given prior GI bleed, hypertension, hyperlipidemia, type 2 diabetes mellitus, hypothyroidism, CKD stage II, sigmoid colon stricture s/p diverting loop colostomy on 07/17/2023, chronic anemia, rheumatoid arthritis, and dementia.  Cardiology asked to see due to  tachycardia  Assessment & Plan .     Sinus tachycardia Not on telemetry but does not feel tachycardic based off radial pulse.  No evidence of SVT here, reported but not verified.  Also in the setting of a potassium of 2.8 that is being repleted.  Normal TSH. We switch Toprol-XL to Lopressor 50 mg twice daily, seems to have achieved adequate rate control once achieved adequate rate control can switch back to Toprol-XL.  Atypical chest pain with elevated troponins Nonobstructive CAD Left heart catheterization in 2016 indicating  nonobstructive disease.  She is having GI related stomach pain that has since resolved today.  Also previously reproducible with palpation.  EKG without any acute ischemic changes.  Troponins 32-28.  Not in the pattern of ACS.  Do not recommend ischemic evaluation at this time, after her GI related issues if she has more convincing pain can consider outpatient ischemic evaluation at that time.  Chronic HFpEF Had some mild lower extremity edema restarted on p.o. Lasix 40 mg.  Has done well with this but also has had issues with hypotension so we are titrating back medications slowly.  Overall looks to be euvolemic. Started on spironolactone 12.5 mg.  Blood pressure has been between 140s to 160s.  Suspect she can tolerate full dose.  If BP remains to be elevated would change to full dose. On Lasix 40 mg daily  Hypokalemia Potassium 3.2 today. Getting supplemented with potassium 40 mEq twice daily.  Will check a magnesium.  Hypertension She is elevated here but has had history of hypotension at her last admission which was felt related to hypovolemia and adrenal sufficiency with chronic use of steroids.  Adding back therapy conservatively.  Otherwise, per primary team.  - Sigmoid colon stricture s/p diverting loop colostomy on 2/28 - Interstitial lung disease on home O2 - History of PE/DVT: not on anticoagulation given prior GI bleed - Hyperlipidemia - Type 2 diabetes mellitus - Hypothyroidism - CKD stage II - Anemia - Dementia     For questions or updates, please contact [facility] Please consult [email or web address] for contact info under        Signed, Abagail Kitchens, PA-C    Personally seen and examined. Agree with above.  82 and rehab currently.  We were consulted for sinus tachycardia heart rate has improved with Lopressor 50 mg twice a day.  It is reasonable to transition her over to Toprol-XL 100 mg daily. Heart regular rate and rhythm with occasional  ectopy. Nonobstructive CAD normal ejection fraction hypokalemia potassium 3.2 being supplemented.  Pleasant.  Remembers me from yesterday.  Donato Schultz, MD

## 2023-07-24 NOTE — Progress Notes (Signed)
 PROGRESS NOTE   Subjective/Complaints: No new complaints this morning Worked with neuropsych today Did much better with therapy today Weight decreased today  ROD: +chronic pain, +fatigue, +lower extremity edema- improved   Objective:   No results found. Recent Labs    07/22/23 1343  WBC 9.3  HGB 10.2*  HCT 32.9*  PLT 154   Recent Labs    07/23/23 1348 07/24/23 0630  NA 142 142  K 3.4* 3.2*  CL 107 109  CO2 25 32  GLUCOSE 163* 90  BUN <5* 5*  CREATININE 0.58 0.58  CALCIUM 7.6* 7.4*    Intake/Output Summary (Last 24 hours) at 07/24/2023 1642 Last data filed at 07/24/2023 1340 Gross per 24 hour  Intake 671.92 ml  Output 300 ml  Net 371.92 ml        Physical Exam: Vital Signs Blood pressure 135/86, pulse 95, temperature 97.7 F (36.5 C), temperature source Oral, resp. rate 17, weight 97.3 kg, SpO2 99%. Gen: no distress, normal appearing HEENT: oral mucosa pink and moist, NCAT Cardio: Sinus tachycardia Chest: normal effort, normal rate of breathing Abd: soft, non-distended Ext: no edema Psych: pleasant, normal affect Skin: intact Mental Status: AAOx3, memory grossly intact, fund of knowledge appropriate Speech/Languate: Follows commands CRANIAL NERVES: Cranial nerves II through XII grossly intact     MOTOR: RUE: 3/5 Deltoid, 4/5 Biceps, 4/5 Triceps,4/5 Grip LUE: 3/5 Deltoid, 4/5 Biceps, 4/5 Triceps, 4/5 Grip RLE: HF 4-/5, KE 4/5, ADF 4/5, APF 4/5 LLE: HF 4-/5, KE 4/5, ADF 4/5, APF 4/5   SENSORY: Normal to touch all 4 extremities   Coordination: Normal finger to nose, no tremor, no dysmetria, stable 3/7  Assessment/Plan: 1. Functional deficits which require 3+ hours per day of interdisciplinary therapy in a comprehensive inpatient rehab setting. Physiatrist is providing close team supervision and 24 hour management of active medical problems listed below. Physiatrist and rehab team continue to  assess barriers to discharge/monitor patient progress toward functional and medical goals  Care Tool:  Bathing    Body parts bathed by patient: Left arm, Chest, Abdomen, Face   Body parts bathed by helper: Right arm, Front perineal area, Buttocks, Right upper leg, Left upper leg, Right lower leg, Left lower leg     Bathing assist Assist Level: Maximal Assistance - Patient 24 - 49%     Upper Body Dressing/Undressing Upper body dressing   What is the patient wearing?: Pull over shirt    Upper body assist Assist Level: Moderate Assistance - Patient 50 - 74%    Lower Body Dressing/Undressing Lower body dressing      What is the patient wearing?: Incontinence brief, Pants     Lower body assist Assist for lower body dressing: Total Assistance - Patient < 25%     Toileting Toileting    Toileting assist Assist for toileting: Maximal Assistance - Patient 25 - 49%     Transfers Chair/bed transfer  Transfers assist  Chair/bed transfer activity did not occur: Safety/medical concerns (fatigue, pain)  Chair/bed transfer assist level: Minimal Assistance - Patient > 75%     Locomotion Ambulation   Ambulation assist   Ambulation activity did not occur: Safety/medical concerns (fatigue,  pain)  Assist level: Minimal Assistance - Patient > 75% Assistive device: Walker-rolling Max distance: 24ft   Walk 10 feet activity   Assist  Walk 10 feet activity did not occur: Safety/medical concerns (fatigue, pain)        Walk 50 feet activity   Assist Walk 50 feet with 2 turns activity did not occur: Safety/medical concerns (fatigue, pain)         Walk 150 feet activity   Assist Walk 150 feet activity did not occur: Safety/medical concerns (fatigue, pain)         Walk 10 feet on uneven surface  activity   Assist Walk 10 feet on uneven surfaces activity did not occur: Safety/medical concerns (fatigue, pain)         Wheelchair     Assist Is the patient  using a wheelchair?: Yes Type of Wheelchair: Manual Wheelchair activity did not occur: Safety/medical concerns (fatigue, pain)         Wheelchair 50 feet with 2 turns activity    Assist    Wheelchair 50 feet with 2 turns activity did not occur: Safety/medical concerns (fatigue, pain)       Wheelchair 150 feet activity     Assist  Wheelchair 150 feet activity did not occur: Safety/medical concerns (fatigue, pain)       Blood pressure 135/86, pulse 95, temperature 97.7 F (36.5 C), temperature source Oral, resp. rate 17, weight 97.3 kg, SpO2 99%.  Medical Problem List and Plan: 1. Functional deficits secondary to septic shock/debility due to symptomatic chronic sigmoid colon stricture.  Status post diverting loop ostomy 2/28             -patient may shower if ostomy covered             -ELOS/Goals: 12-14, Min A to sup PT/OT             -Grounds pass ordered  2.  Antithrombotics: -DVT/anticoagulation:  Pharmaceutical: Lovenox             -antiplatelet therapy: N/A  3. Pain Management/chronic pain: Neurontin 300 mg twice daily, Voltaren gel 2 g 3 times daily, Lidoderm patch as directed, MS Contin 15 mg every 12 hours, oxycodone changed to MRIR BID prn, Robaxin as needed  4. Mood/Behavior/Sleep: Cymbalta 60 mg daily             -antipsychotic agents: N/A 5. Neuropsych/cognition: This patient is capable of making decisions on her own behalf.  6. Skin/Wound Care: Routine skin checks  7. Fluids/Electrolytes/Nutrition: Routine in and outs with follow-up chemistries             Hypokalemia- Recheck labs 8.  Hypotension.  Resolved.  Midodrine discontinued.  Toprol-XL 50 mg daily.  Home Benicar/hydralazine on hold resumed as needed.    9.  Rheumatoid arthritis.  Chronic prednisone resume.Leflunomide/Plaquenil currently on hold- recommended discussion of resumption with her outpatient PCP to minimize immune suppression post-operatively  10.  Hyperlipidemia.  Lipitor  11.   Diabetes mellitus type 2 with peripheral neuropathy.  Hemoglobin A1c 5.6.  SSI.              -CBGs well controlled  12.  Hypothyroidism.  Synthroid  13.  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  Chronic oxygen.  3 L at baseline prior to admission.  Prophylactic Zithromax resumed  14.  Chronic diastolic congestive heart failure.  Monitor for any signs of fluid overload. Daily weights  15.  CKD stage III.  Follow-up chemistries.  Last Cr 0.66 3/3  16.  Iron deficiency anemia.  Follow-up CBC  17.  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  Status post gamma knife 10/13.  Follow-up outpatient  18.  Obesity.  BMI 35.42.  Dietary follow-up, d/c feeding supplement  19. HLD. continue stain  20. Hemorrhoid: tucks ordered, continue  21. Fatigue: B12 injection ordered 3/5  22. Tachycardia: consulted cardiology and no changes recommended, lopressor changed to 50mg  BID  23. Chest pain: troponin ordered, discussed with cardiology and felt to be more epigastric  24. Lower extremity edema: lasix ordered, continue 40mg  daily  25. HTN: lasix ordered, continue 40mg  daily    LOS: 4 days A FACE TO FACE EVALUATION WAS PERFORMED  Drema Pry Darrie Macmillan 07/24/2023, 4:42 PM

## 2023-07-24 NOTE — Plan of Care (Signed)
  Problem: Consults Goal: RH GENERAL PATIENT EDUCATION Description: See Patient Education module for education specifics. Outcome: Progressing   Problem: RH BOWEL ELIMINATION Goal: RH STG MANAGE BOWEL WITH ASSISTANCE Description: STG Manage Bowel with min Assistance. Outcome: Progressing   Problem: RH BLADDER ELIMINATION Goal: RH STG MANAGE BLADDER WITH ASSISTANCE Description: STG Manage Bladder With toileting Assistance Outcome: Progressing   Problem: RH SKIN INTEGRITY Goal: RH STG ABLE TO PERFORM INCISION/WOUND CARE W/ASSISTANCE Description: STG Able To Perform Incision/Wound Care With min Assistance. Outcome: Progressing

## 2023-07-24 NOTE — Progress Notes (Signed)
 Occupational Therapy Session Note  Patient Details  Name: [name] MRN: [id number] Date of Birth: 04-12-1941  Today's Date: 07/24/2023 OT Individual Time: 1105-1200 & 1332-1415 OT Individual Time Calculation (min): 55 min & 43 min   Short Term Goals: Week 1:  OT Short Term Goal 1 (Week 1): Pt will complete sit > stand in prep for ADL with CGA using LRAD OT Short Term Goal 2 (Week 1): Pt will complete toilet transfer with CGA using LRAD OT Short Term Goal 3 (Week 1): Pt will complete 1/3 toileting steps with CGA for balance OT Short Term Goal 4 (Week 1): Pt will thread LB clothing with supervision using AE PRN  Skilled Therapeutic Interventions/Progress Updates:  Session 1 Skilled OT intervention completed with focus on toileting needs, functional transfers, activity tolerance. Pt received seated in w/c, agreeable to session. Lower back pain reported; nurse notified of pain med request and ostomy care needed. OT offered rest breaks, repositioning throughout for pain reduction.  Pt verbalized the urge to use bathroom and staff was unable to attend to her. Pt unable to recognize any incontinence. Transported dependently in w/c <> toilet. Utilized grab bar for transfer, with min A sit > stand and stand pivot with cues for completing the transfer vs prematurely sitting. Pt consistently needs cues to back up prior to sitting on all surfaces. Max A to lower pants. Pt heavily incontinent of urinary void, and small BM. Dependent for posterior pericare, while seated and for donning of new brief and pants however pt able to complete x2 stands with min A using grab bar. Min A stand pivot > w/c. Seated at sink pt was set up A for hand hygiene.   Nurse notified of recommendation for pt to be timed toileted due to frequent incontinence. Transported dependently in w/c <> gym. Completed 2x10 bicep curls on each arm. Then back in room, nursing made request to put pt into recliner for elevation/edema  management of BLE. Required min A sit > stand and stand pivot with RW > recliner. BLE elevated. Pt remained seated in recliner, with chair alarm on/activated, and with all needs in reach at end of session.  Vitals -SPO2 97% on RA, HR 68 bpm after activity  Session 2 Skilled OT intervention completed with focus on toileting needs, functional sit > stands, activity tolerance. Pt received seated in recliner, agreeable to session. Lower back pain reported; pre-medicated. OT offered rest breaks, repositioning throughout for pain reduction.  OT prompted pt about toileting due to timed toileting sheet indicating time and pt reported she hadn't gone yet. Utilized stedy for time and energy conservation. Required heavy min A to stand in stedy, then dependent transfer > BSC over toilet. Stood with CGA from perched position for max A doffing of pants. Pt continent of urinary void only. Pt with difficulty wiping due to body habitus, reporting she uses toileting wand at baseline. OT suggested pericare in stance for greater access, however after min A sit > stand in stedy, pt still required max A for pericare and redonning of LB clothing due to onset of back pain and overall fatigue. Cues needed to stand erect and tuck her pelvis vs hanging over stedy with trunk on elbows. Dependent transfer in stedy > w/c and CGA stand from perched position. Set up A for hand hygiene at sink.  Pt self-propelled in w/c about 70 ft with supervision using BUE, however with insufficient leverage with cues needed to bring hands further back on rims. Anticipate  size of w/c challenges pt's ability to propel as well and may benefit from smaller w/c.   Seated in w/c, pt was able to sort and match simple designed cards to assess overall cognitive, with 1 error, with min A needed to correct.   Back in room, pt remained seated in w/c, with chair alarm on/activated, and with all needs in reach at end of session.  Vitals -SPO2 97% on RA, HR  100 bpm after activity  Therapy Documentation Precautions:  Precautions Precautions: Fall Precaution/Restrictions Comments: ostomy, monitor O2 and HR Restrictions Weight Bearing Restrictions Per Provider Order: No    Therapy/Group: Individual Therapy  Melvyn Novas, MS, OTR/L  07/24/2023, 2:22 PM

## 2023-07-24 NOTE — Consult Note (Signed)
 Neuropsychological Consultation Comprehensive Inpatient Rehab   Patient:   [name]   DOB:   10/22/1940  MR Number:  [id number]  Location:  [facility] [address] CENTER A [address] [place] [address] Dept: [phone] Loc: [phone]           Date of Service:   07/24/2023  Start Time:   10 AM End Time:   11 AM  Provider/Observer:  Arley Phenix, Psy.D.       Clinical Neuropsychologist       Billing Code/Service: [phone]  Reason for Service:    [name] is an 83 year old female referred for neuropsychological consultation during her ongoing admission to the comprehensive inpatient rehabilitation unit due to anxiety and coping issues related to extended hospital stay with significant medical illness recently.  Patient has significant past medical history including chronic sigmoid stricture related to diverticulitis, hypertension, hyperlipidemia, diabetes with peripheral neuropathy, hypothyroidism, chronic kidney disease stage III, gout, chronic respiratory failure/COPD, chronic diastolic congestive heart failure, rheumatoid arthritis with chronic steroids, history of GI bleed, DVT/PE, chronic pain, iron deficiency, meningioma.  Patient has had recent admissions for both septic shock secondary to flu A/RSV pneumonia/UTI and again presentation in the end of February due to sepsis.  Her cranial CT was negative she did demonstrate moderate small vessel disease and global atrophy on neuroimaging.  Once patient was stabilized she was referred and admitted to the comprehensive inpatient rehabilitation unit due to decreased functional mobility with extended hospital stay.  Patient has been followed previously through physical medicine due to severe chronic pain and has been treated with SSRI medications for mood including depression and anxiety type symptoms.  During today's clinical visit, the patient was pleasant and  generally oriented but slowed information processing speed was noted.  Patient was able to describe some of the medical issues she is dealt with for more than a month with extended hospital stay.  Patient notes that she continues to be very weak and clearly is debilitated with significant deconditioning.  Patient notes that she has made improvements in strength and mobility during CIR and continues to be motivated with hopes to be able to return home and maintain her own ADLs.  Patient has a long history of anxiety and acknowledges exacerbation of her anxiety with fear of her medical status.  The primary focus today was on building coping skills and strategies around dealing with her significant physical decline and medical illness and maintaining motivation and effort to continue in rehab efforts.  HPI for the current admission:    HPI: [name] is a 83 year old right-handed female with history significant for chronic sigmoid stricture related to diverticular disease, hypertension, hyperlipidemia, diabetes mellitus with peripheral neuropathy, hypothyroidism, CKD stage III, gout, ILD, chronic respiratory failure/COPD on home oxygen with 3 L at baseline, chronic diastolic congestive heart failure, rheumatoid arthritis with chronic steroids, history of GI bleed, DVT/PE, chronic pain maintained on MS Contin 15 mg every 12 hours as well as oxycodone as needed, iron deficiency anemia, obesity with BMI 35.42, 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 Per chart review patient lives with spouse and son. She has an aide Monday through Friday 9-1. Two-level home bed and bath main level with one-step to entry. Modified independent for ADLs and rollator for mobility. Patient with recent admission for sepsis 06/30/2023 - 07/10/2023 as well as viral respiratory illness and discharged for short  time to skilled nursing facility as well as recent admission 06/13/2023 -  06/26/2023 for septic shock secondary to flu A/RSV pneumonia/UTI. Presented 07/15/2023 with abdominal pain and diarrhea with black stools as well as hypotension. She was given IV fluids with improvement in blood pressure. Admission chemistries unremarkable except WBC 11,900, hemoglobin 11.8, potassium 3.4, CO2 21, creatinine 1.09, lipase of 20, lactic acid 3.1, blood cultures no growth to date, urinalysis negative nitrite. Guaiac positive and noted low-grade fever. Code sepsis was initiated. Cranial CT scan negative. CT of the abdomen pelvis showed dilatation of colon-abrupt change in caliber noted involving the sigmoid colon-likely stricture. General surgery consulted for large bowel obstruction secondary to distal sigmoid stenosis undergoing laparoscopic assisted diverting loop colostomy 07/17/2023 per Dr.Tsuei. Initially maintained on broad-spectrum antibiotics. Blood pressure remained monitored initially managed with stress dose IV hydrocortisone/midodrine with significant improvement and midodrine has been discontinued transition hydrocortisone to prednisone. Patient's hemoglobin and hematocrit remained stable. Rheumatoid arthritis currently with Plaquenil/Leflunomide on hold. She reports chronic joint pain particularly in her shoulders, hands and feet. She did not sleep well yesterday due to headache. She says prednisone sometimes contributes to her headaches. Patient's prednisone has been resumed. Headache has resolved today. She did have a run of SVT and Toprol has been adjusted. Subcutaneous Lovenox for DVT prophylaxis. She is currently on a soft diet advanced as tolerated. Therapy evaluations completed due to patient's decreased functional mobility was admitted for a comprehensive rehab program   Medical History:   Past Medical History:  Diagnosis Date   Acute encephalopathy 03/30/2021   11/12 - 04/03/2021 acute encephalopathy in the context of altered mental status and poor oral intake following  increasing pain medicines and associated Klebsiella UTI.     Acute kidney injury superimposed on chronic kidney disease (HCC) 04/17/2015   11/12 - 04/03/2021 AKI with creatinine of 2.22 and GFR 22 indicating stage IV renal disease in the context of encephalopathy secondary to increased pain medications with decreased oral intake of fluids and nutrition.     Acute on chronic diastolic (congestive) heart failure (HCC)    Anemia    iron deficiency anemia - secondary to blood loss ( chronic)    Anxiety    Arthritis    endstage changes bilateral knees/bilateral ankles.    Asthma    Carotid artery occlusion    Chest pain of uncertain etiology 01/28/2012   Normal coronaries at cath     Chronic fatigue    Chronic kidney disease    Closed intertrochanteric fracture of hip, left, initial encounter (HCC) 11/29/2016   Closed left hip fracture (HCC)    Closed nondisplaced intertrochanteric fracture of left femur with delayed healing 01/14/2017   Clotting disorder (HCC)    pt denies this   Contusion of left knee    due to fall 1/14.   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  Morbid obesity (HCC)    Neuromuscular disorder (HCC)    diabetic neuropathy    Normal coronary arteries    cardiac catheterization performed  10/31/14   Pneumonia    RA (rheumatoid arthritis) (HCC)    has been off methotreaxte since 10/13.   Septic shock (HCC) 06/13/2023   Spinal stenosis of lumbar region    Thyroid  disease    Unspecified lack of coordination    URI (upper respiratory infection)    UTI (urinary tract infection) 03/31/2021   11/12 - 04/03/2021 Klebsiella UTI associated with encephalopathy in the context of AKI.  Status post IV ceftriaxone followed by oral Keflex x48 hours.           Patient Active Problem List   Diagnosis Date Noted   Anxiety state 07/24/2023   Septic shock (HCC) 07/20/2023   Type 2 diabetes mellitus with diabetic polyneuropathy, without long-term current use of insulin (HCC) 07/20/2023   Iron deficiency anemia 07/20/2023   Hyperlipidemia 07/20/2023   Chronic diastolic congestive heart failure (HCC) 07/20/2023   Abdominal pain 07/16/2023   GI bleed 07/15/2023   Enterocolitis 06/30/2023   Sepsis due to undetermined organism (HCC) 06/30/2023   Hypothyroidism 06/30/2023   GERD without esophagitis 06/30/2023   Peripheral neuropathy 06/30/2023   RSV (respiratory syncytial virus pneumonia) 06/26/2023   Ileus (HCC) 06/23/2023   Lower GI bleed 12/16/2022   BRBPR (bright red blood per rectum) 12/15/2022   [name] 09/08/2022   Abnormal CT scan, sigmoid colon 04/14/2022   Colon stricture (HCC) 04/14/2022   Diverticulosis of colon without hemorrhage 04/14/2022   History of pulmonary embolus (PE) 11/15/2021   Tachycardia 10/22/2021   Pulmonary HTN (HCC)    Unspecified protein-calorie malnutrition (HCC) 04/05/2021   Pressure injury of skin 03/31/2021   Thrombocytopenia (HCC) 03/30/2021   Diabetic neuropathy (HCC) 05/24/2019   Hypokalemia 04/21/2019   Anticoagulated 06/25/2018   Chronic pain 06/25/2018   CRI (chronic renal insufficiency), stage 3 (moderate) (HCC) 06/25/2018   Drug-induced constipation 06/18/2018   DVT (deep venous thrombosis) (HCC) 06/03/2018   Primary osteoarthritis of both knees 05/26/2018   Body mass index (BMI) of 50-59.9 in adult (HCC) 05/26/2018   Acquired hypothyroidism 05/26/2018   Chronic bilateral  low back pain without sciatica 02/17/2018   Astigmatism with presbyopia, bilateral 03/26/2017   Cortical age-related cataract of both eyes 03/26/2017   Family history of glaucoma 03/26/2017   Long term current use of oral hypoglycemic drug 03/26/2017   Nuclear sclerotic cataract of both eyes 03/26/2017   Pain in left hip 02/18/2017   Gout 11/29/2016   Depression 11/29/2016   Physical deconditioning 07/31/2015   Pulmonary fibrosis, postinflammatory (HCC) 07/31/2015   Bronchiectasis without complication (HCC) 07/31/2015   Tendinitis of left rotator cuff 06/13/2015   Chronic heart failure with preserved ejection fraction (HFpEF) (HCC) 04/21/2015   Chronic respiratory failure with hypoxia (HCC) 02/09/2015   Dyspnea and respiratory abnormality 01/03/2015   Normal coronary arteries 11/02/2014   Morbid obesity (HCC) 10/30/2014   Dysphagia 08/21/2014   Debility 06/21/2014   DOE (dyspnea on exertion) 05/24/2014   Primary osteoarthritis of left knee 04/26/2014   High risk medication use 04/17/2014   Aphasia 02/12/2014   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) 01/18/2014   Lumbar facet arthropathy 01/18/2014   Bilateral edema of lower extremity 05/30/2013   Chronic cough 05/24/2013   ILD (interstitial lung disease) (HCC) 04/10/2013   Spinal stenosis of lumbar region with radiculopathy  07/12/2012   Inability to walk 07/12/2012   Meningioma of left sphenoid wing involving cavernous sinus (HCC) 02/17/2012   Rheumatoid arthritis (HCC) 01/28/2012   Primary osteoarthritis of right knee 01/28/2012   Dyslipidemia    Essential hypertension     Behavioral Observation/Mental Status:   Darnell F Ortego  presents as a 83 y.o.-year-old Right handed African American Female who appeared her stated age. her dress was Appropriate and she was Well Groomed and her manners were Appropriate to the situation.  her participation was indicative of Appropriate and Redirectable behaviors.  There  were physical disabilities noted.  she displayed an appropriate level of cooperation and motivation.    Interactions:    Active Appropriate  Attention:   abnormal and attention span appeared shorter than expected for age  Memory:   abnormal; remote memory intact, recent memory impaired  Visuo-spatial:   not examined  Speech (Volume):  low  Speech:   normal; normal  Thought Process:  Coherent and Relevant  Coherent  Though Content:  WNL; not suicidal and not homicidal  Orientation:   person, place, and situation  Judgment:   Fair  Planning:   Poor  Affect:    Anxious  Mood:    Anxious  Insight:   Fair  Intelligence:   normal  Psychiatric History:  Patient had already been started on SSRI medications prior to her most recent serious medical illness that was being maintained by outpatient physical medicine previously.  Family Med/Psych History:  Family History  Problem Relation Age of Onset   Diabetes Mother    Heart attack Mother    Hypertension Father    Lung cancer Father    Diabetes Sister    Breast cancer Sister    Breast cancer Sister    Diabetes Brother    Hypertension Brother    Heart disease Brother    Heart attack Brother    Kidney cancer Brother    Gout Brother    Kidney failure Brother        x 5   Esophageal cancer Brother    Stomach cancer Brother    Prostate cancer Brother    Uterine cancer Daughter        with mets   Fibroids Daughter    Hypertension Daughter    Hypertension Daughter    Hypertension Son    Heart murmur Son    Rheum arthritis Maternal Uncle    Alzheimer's disease Neg Hx    Dementia Neg Hx     Impression/DX:   Deshanae F Canion is an 83 year old female referred for neuropsychological consultation during her ongoing admission to the comprehensive inpatient rehabilitation unit due to anxiety and coping issues related to extended hospital stay with significant medical illness recently.  Patient has significant past medical  history including chronic sigmoid stricture related to diverticulitis, hypertension, hyperlipidemia, diabetes with peripheral neuropathy, hypothyroidism, chronic kidney disease stage III, gout, chronic respiratory failure/COPD, chronic diastolic congestive heart failure, rheumatoid arthritis with chronic steroids, history of GI bleed, DVT/PE, chronic pain, iron deficiency, meningioma.  Patient has had recent admissions for both septic shock secondary to flu A/RSV pneumonia/UTI and again presentation in the end of February due to sepsis.  Her cranial CT was negative she did demonstrate moderate small vessel disease and global atrophy on neuroimaging.  Once patient was stabilized she was referred and admitted to the comprehensive inpatient rehabilitation unit due to decreased functional mobility with extended hospital stay.  Patient has been followed previously through physical  medicine due to severe chronic pain and has been treated with SSRI medications for mood including depression and anxiety type symptoms.  During today's clinical visit, the patient was pleasant and generally oriented but slowed information processing speed was noted.  Patient was able to describe some of the medical issues she is dealt with for more than a month with extended hospital stay.  Patient notes that she continues to be very weak and clearly is debilitated with significant deconditioning.  Patient notes that she has made improvements in strength and mobility during CIR and continues to be motivated with hopes to be able to return home and maintain her own ADLs.  Patient has a long history of anxiety and acknowledges exacerbation of her anxiety with fear of her medical status.  The primary focus today was on building coping skills and strategies around dealing with her significant physical decline and medical illness and maintaining motivation and effort to continue in rehab efforts.  Disposition/Plan:  Today we worked on coping and  adjustment issues.  Patient denied that mood change had risen to the level that would keep her from actively participating in therapeutic efforts and we worked on strategies for continued motivation and continued efforts with therapies.          Electronically Signed   _______________________ Arley Phenix, Psy.D. Clinical Neuropsychologist

## 2023-07-24 NOTE — Progress Notes (Signed)
 Physical Therapy Session Note  Patient Details  Name: [name] MRN: [id number] Date of Birth: 03-02-1941  Today's Date: 07/24/2023 PT Individual Time: [id number] and [id number] PT Individual Time Calculation (min): 43 min and 39 min  Short Term Goals: Week 1:  PT Short Term Goal 1 (Week 1): pt will perform bed mobility with min A consistantly PT Short Term Goal 2 (Week 1): pt will transfer bed<>chair with LRAD and CGA PT Short Term Goal 3 (Week 1): pt will ambulate 55ft with LRAD and CGA  Skilled Therapeutic Interventions/Progress Updates:   Treatment Session 1 Received pt semi-reclined in bed and reporting feeling much better today. Pt agreeable to PT treatment and denied any pain initially, but reported increased R hip pain with mobility - RN notified. Pt initially on 2L O2 with SPO2 99%; decreased to RA and SPO2 remained >95% throughout session. NT arrived to check blood sugar, then donned non-skid socks dependently and pt transferred semi-reclined<>sitting R EOB with HOB elevated and use of bedrails with supervision and increased time/effort - noted increased difficulty transitioning from R sidelying<>sitting completely upright. Donned pants sitting EOB with max A, then stood from EOB with RW and min A and required max A to pull pants over hips. Transferred into WC via stand<>pivot with RW and min A - pt still lacking hip and trunk extension but more upright than previous days. Sat in WC at sink and brushed teeth and washed face with setup assist while therapist built up legrests with towels for BLE support. MD arrived for morning rounds and concluded session with pt sitting in [facility], needs within reach, and seatbelt alarm on.  Pt remains deconditioned with poor activity tolerance, requiring frequent rest breaks.   Vitals: Supine at rest: HR 101bpm, SPO2 99%, and BP: 145/88 Sitting EOB: HR 123bpm decreasing to 108bpm Sitting in WC after transfer: HR 114bpm decreasing to 107bpm, SPO2 97%,  and BP 163/90 (trial 1) and 157/82 (trial 2)  Treatment Session 2 Received pt sitting in WC stating "oh, I thought I was done for today". Referenced schedule and pt agreeable to PT treatment and reported pain in low back and R hip with standing only. Session with emphasis on functional mobility/transfers, generalized strengthening and endurance, and gait training. Pt transported to/from room in [facility] dependently for time management purposes. Pt stood with RW and min A x 5 trials with cues for hand placement. In standing, worked on upright posture/gaze and hip/trunk extension. Pt with difficulty coming and staying completely upright due to pain in R lower back/hip - RN notified and ordering k-pad. Pt ambulated 27ft with RW and min A - limited by pain. Pt ambulating with trunk flexed to almost 90 degrees and unable to come upright. Returned to room and concluded session with pt sitting in WC, needs within reach, and seatbelt alarm on. Daughter present at bedside.   Vitals: Sitting in WC: HR 93bpm, SPO2 97%, and BP: 125/81  Therapy Documentation Precautions:  Precautions Precautions: Fall Precaution/Restrictions Comments: ostomy, monitor O2 and HR Restrictions Weight Bearing Restrictions Per Provider Order: No  Therapy/Group: Individual Therapy Marlana Salvage Zaunegger Blima Rich PT, DPT 07/24/2023, 6:51 AM

## 2023-07-25 DIAGNOSIS — R5381 Other malaise: Secondary | ICD-10-CM | POA: Diagnosis not present

## 2023-07-25 LAB — BASIC METABOLIC PANEL
Anion gap: 5 (ref 5–15)
BUN: 6 mg/dL — ABNORMAL LOW (ref 8–23)
CO2: 29 mmol/L (ref 22–32)
Calcium: 7.7 mg/dL — ABNORMAL LOW (ref 8.9–10.3)
Chloride: 108 mmol/L (ref 98–111)
Creatinine, Ser: 0.67 mg/dL (ref 0.44–1.00)
GFR, Estimated: >60 mL/min (ref >60)
Glucose, Bld: 89 mg/dL (ref 70–99)
Potassium: 3.8 mmol/L (ref 3.5–5.1)
Sodium: 142 mmol/L (ref 135–145)

## 2023-07-25 NOTE — Progress Notes (Signed)
 PROGRESS NOTE   Subjective/Complaints:  Pt reports hasn't gotten her AM meds- - explained can be here up to 9am LBM yesterday. With ostomy- no teacher cam eyesterday.    Pain better yesterday and today-    ROD: +chronic pain, +fatigue, +lower extremity edema- improved  Pt denies SOB, abd pain, CP, N/V/C/D, and vision changes   Objective:   No results found. Recent Labs    07/22/23 1343  WBC 9.3  HGB 10.2*  HCT 32.9*  PLT 154   Recent Labs    07/24/23 0630 07/25/23 0612  NA 142 142  K 3.2* 3.8  CL 109 108  CO2 32 29  GLUCOSE 90 89  BUN 5* 6*  CREATININE 0.58 0.67  CALCIUM 7.4* 7.7*    Intake/Output Summary (Last 24 hours) at 07/25/2023 1011 Last data filed at 07/25/2023 0750 Gross per 24 hour  Intake 758 ml  Output 500 ml  Net 258 ml        Physical Exam: Vital Signs Blood pressure (!) 149/92, pulse 94, temperature 98.4 F (36.9 C), resp. rate 18, weight 97.3 kg, SpO2 100%.   General: awake, alert, appropriate, sitting up in bed;  NAD HENT: conjugate gaze; oropharynx moist CV: regular rate and rhythm; no JVD Pulmonary: CTA B/L; no W/R/R- good air movement GI: soft, NT, ND, (+)BS Psychiatric: appropriate- interactive Neurological: Ox3  Mental Status: AAOx3, memory grossly intact, fund of knowledge appropriate Speech/Languate: Follows commands CRANIAL NERVES: Cranial nerves II through XII grossly intact     MOTOR: RUE: 3/5 Deltoid, 4/5 Biceps, 4/5 Triceps,4/5 Grip LUE: 3/5 Deltoid, 4/5 Biceps, 4/5 Triceps, 4/5 Grip RLE: HF 4-/5, KE 4/5, ADF 4/5, APF 4/5 LLE: HF 4-/5, KE 4/5, ADF 4/5, APF 4/5   SENSORY: Normal to touch all 4 extremities   Coordination: Normal finger to nose, no tremor, no dysmetria, stable 3/7  Assessment/Plan: 1. Functional deficits which require 3+ hours per day of interdisciplinary therapy in a comprehensive inpatient rehab setting. Physiatrist is providing close  team supervision and 24 hour management of active medical problems listed below. Physiatrist and rehab team continue to assess barriers to discharge/monitor patient progress toward functional and medical goals  Care Tool:  Bathing    Body parts bathed by patient: Left arm, Chest, Abdomen, Face   Body parts bathed by helper: Right arm, Front perineal area, Buttocks, Right upper leg, Left upper leg, Right lower leg, Left lower leg     Bathing assist Assist Level: Maximal Assistance - Patient 24 - 49%     Upper Body Dressing/Undressing Upper body dressing   What is the patient wearing?: Pull over shirt    Upper body assist Assist Level: Moderate Assistance - Patient 50 - 74%    Lower Body Dressing/Undressing Lower body dressing      What is the patient wearing?: Incontinence brief, Pants     Lower body assist Assist for lower body dressing: Total Assistance - Patient < 25%     Toileting Toileting    Toileting assist Assist for toileting: Maximal Assistance - Patient 25 - 49%     Transfers Chair/bed transfer  Transfers assist  Chair/bed transfer activity did not occur: Safety/medical  concerns (fatigue, pain)  Chair/bed transfer assist level: Minimal Assistance - Patient > 75%     Locomotion Ambulation   Ambulation assist   Ambulation activity did not occur: Safety/medical concerns (fatigue, pain)  Assist level: Minimal Assistance - Patient > 75% Assistive device: Walker-rolling Max distance: 65ft   Walk 10 feet activity   Assist  Walk 10 feet activity did not occur: Safety/medical concerns (fatigue, pain)        Walk 50 feet activity   Assist Walk 50 feet with 2 turns activity did not occur: Safety/medical concerns (fatigue, pain)         Walk 150 feet activity   Assist Walk 150 feet activity did not occur: Safety/medical concerns (fatigue, pain)         Walk 10 feet on uneven surface  activity   Assist Walk 10 feet on uneven  surfaces activity did not occur: Safety/medical concerns (fatigue, pain)         Wheelchair     Assist Is the patient using a wheelchair?: Yes Type of Wheelchair: Manual Wheelchair activity did not occur: Safety/medical concerns (fatigue, pain)         Wheelchair 50 feet with 2 turns activity    Assist    Wheelchair 50 feet with 2 turns activity did not occur: Safety/medical concerns (fatigue, pain)       Wheelchair 150 feet activity     Assist  Wheelchair 150 feet activity did not occur: Safety/medical concerns (fatigue, pain)       Blood pressure (!) 149/92, pulse 94, temperature 98.4 F (36.9 C), resp. rate 18, weight 97.3 kg, SpO2 100%.  Medical Problem List and Plan: 1. Functional deficits secondary to septic shock/debility due to symptomatic chronic sigmoid colon stricture.  Status post diverting loop ostomy 2/28             -patient may shower if ostomy covered             -ELOS/Goals: 12-14, Min A to sup PT/OT             -Grounds pass ordered  Con't CIR PT and OT 2.  Antithrombotics: -DVT/anticoagulation:  Pharmaceutical: Lovenox             -antiplatelet therapy: N/A  3. Pain Management/chronic pain: Neurontin 300 mg twice daily, Voltaren gel 2 g 3 times daily, Lidoderm patch as directed, MS Contin 15 mg every 12 hours, oxycodone changed to MRIR BID prn, Robaxin as needed  3/8- pain doing much better last 2 days- con't regimen 4. Mood/Behavior/Sleep: Cymbalta 60 mg daily             -antipsychotic agents: N/A 5. Neuropsych/cognition: This patient is capable of making decisions on her own behalf.  6. Skin/Wound Care: Routine skin checks  7. Fluids/Electrolytes/Nutrition: Routine in and outs with follow-up chemistries             Hypokalemia- Recheck labs  3/8- K+ up to 3.8 8.  Hypotension.  Resolved.  Midodrine discontinued.  Toprol-XL 50 mg daily.  Home Benicar/hydralazine on hold resumed as needed.    9.  Rheumatoid arthritis.  Chronic  prednisone resume.Leflunomide/Plaquenil currently on hold- recommended discussion of resumption with her outpatient PCP to minimize immune suppression post-operatively  10.  Hyperlipidemia.  Lipitor  11.  Diabetes mellitus type 2 with peripheral neuropathy.  Hemoglobin A1c 5.6.  SSI.              -CBGs well controlled  3/8- Cbgs adequate control-  con't regimen 12.  Hypothyroidism.  Synthroid  13.  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  Chronic oxygen.  3 L at baseline prior to admission.  Prophylactic Zithromax resumed  14.  Chronic diastolic congestive heart failure.  Monitor for any signs of fluid overload. Daily weights  3/8- no weight today- yesterday 97.3 kg which was down 15.  CKD stage III.  Follow-up chemistries. Last Cr 0.66 3/3  3/8- Cr 0.66  16.  Iron deficiency anemia.  Follow-up CBC  17.  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  Status post gamma knife 10/13.  Follow-up outpatient  18.  Morbid Obesity.  BMI 35.42.  Dietary follow-up, d/c feeding supplement  3/8- BMI 38 19. HLD. continue stain  20. Hemorrhoid: tucks ordered, continue  21. Fatigue: B12 injection ordered 3/5  22. Tachycardia: consulted cardiology and no changes recommended, lopressor changed to 50mg  BID  23. Chest pain: troponin ordered, discussed with cardiology and felt to be more epigastric  24. Lower extremity edema: lasix ordered, continue 40mg  daily  25. HTN: lasix ordered, continue 40mg  daily   3/8- BP 149/92 this AM, but otherwise has been running 120s systolic- con't regimen  26. Colostomy-   3/8- pt has diverting loop colostomy- needs Ostomy education- placed consult   I spent a total of  38  minutes on total care today- >50% coordination of care- due to   Education of pt about ostomy; d/w pt about pain and review of chart- as well as d/w nursing   LOS: 5 days A FACE TO FACE EVALUATION WAS PERFORMED  Val Farnam 07/25/2023, 10:11 AM

## 2023-07-25 NOTE — Plan of Care (Signed)
  Problem: Consults Goal: RH GENERAL PATIENT EDUCATION Description: See Patient Education module for education specifics. Outcome: Progressing   Problem: RH BOWEL ELIMINATION Goal: RH STG MANAGE BOWEL WITH ASSISTANCE Description: STG Manage Bowel with min Assistance. Outcome: Progressing   Problem: RH BLADDER ELIMINATION Goal: RH STG MANAGE BLADDER WITH ASSISTANCE Description: STG Manage Bladder With toileting Assistance Outcome: Progressing   Problem: RH SKIN INTEGRITY Goal: RH STG ABLE TO PERFORM INCISION/WOUND CARE W/ASSISTANCE Description: STG Able To Perform Incision/Wound Care With min Assistance. Outcome: Progressing   Problem: RH SKIN INTEGRITY Goal: RH STG ABLE TO PERFORM INCISION/WOUND CARE W/ASSISTANCE Description: STG Able To Perform Incision/Wound Care With min Assistance. Outcome: Progressing   Problem: RH SAFETY Goal: RH STG ADHERE TO SAFETY PRECAUTIONS W/ASSISTANCE/DEVICE Description: STG Adhere to Safety Precautions With cues Assistance/Device. Outcome: Progressing   Problem: RH PAIN MANAGEMENT Goal: RH STG PAIN MANAGED AT OR BELOW PT'S PAIN GOAL Description: < 4 with prns Outcome: Progressing   Problem: RH KNOWLEDGE DEFICIT GENERAL Goal: RH STG INCREASE KNOWLEDGE OF SELF CARE AFTER HOSPITALIZATION Description: Patient and spouse will be able to manage care at discharge using educational resources for medications and skin care and dietary modification independently Outcome: Progressing

## 2023-07-25 NOTE — Progress Notes (Signed)
 Patient Name: [name] Date of Encounter: 07/25/2023 [facility] Cardiologist: Nanetta Batty, MD   Interval Summary  .    Feels very good today without any complaints of chest pain.    Vital Signs .    Vitals:   07/24/23 1421 07/24/23 1938 07/25/23 0540 07/25/23 0954  BP: 135/86 (!) 152/90 125/74 (!) 149/92  Pulse: 95 93 91 94  Resp: 17 18 18    Temp: 97.7 F (36.5 C) 98.4 F (36.9 C) 98.4 F (36.9 C)   TempSrc: Oral     SpO2: 99% 94% 100%   Weight:        Intake/Output Summary (Last 24 hours) at 07/25/2023 1054 Last data filed at 07/25/2023 1014 Gross per 24 hour  Intake 758 ml  Output 650 ml  Net 108 ml      07/24/2023    5:04 AM 07/23/2023    8:12 AM 07/23/2023    5:01 AM  Last 3 Weights  Weight (lbs) 214 lb 8.1 oz 218 lb 4.1 oz 195 lb 15.8 oz  Weight (kg) 97.3 kg 99 kg 88.9 kg      Telemetry/ECG    Not on telemetry.  EKG with sinus tachycardia no acute ST-T wave changes.- Personally Reviewed  CV Studies    Right Cardiac Catheterization 08/12/2021: Hemodynamics:   1: Right atrial pressure-20/18 2: Right ventricular pressure-37/17, mean 22 3: Pulmonary artery pressure-43/19, mean 30 4: Pulmonary wedge pressure-A-wave 23, V wave 20, mean 22 5: Cardiac output-4.97 L/min with an index of 2.43 L/min/m 6: PVR-1.81    Impression: Ms. Brenner has moderately elevated right atrial pressures and mildly elevated pulmonary artery pressures.  Her PA saturations were 58%.  The sheath was removed and a bandage was placed over the right antecubital puncture site.  The patient left the lab in stable condition.  She will be discharged home later this morning and to follow-up with Dr. Marchelle Gearing and myself. _______________   Echocardiogram 06/15/2023: Impressions: 1. Left ventricular ejection fraction, by estimation, is 60 to 65%. The  left ventricle has normal function. The left ventricle has no regional  wall motion abnormalities. Left ventricular diastolic  parameters are  consistent with Grade I diastolic  dysfunction (impaired relaxation).   2. Right ventricular systolic function is normal. The right ventricular  size is normal. Tricuspid regurgitation signal is inadequate for assessing  PA pressure.   3. The mitral valve is normal in structure. No evidence of mitral valve  regurgitation. No evidence of mitral stenosis.   4. The aortic valve is tricuspid. There is mild calcification of the  aortic valve. Aortic valve regurgitation is not visualized. No aortic  stenosis is present.   5. IVC not visualized.   6. Exam truncated due to patient noncompliance.   Physical Exam .   GEN: No acute distress.   Neck: No JVD Cardiac: RRR, no murmurs, rubs, or gallops.  Respiratory: Clear to auscultation bilaterally. GI: Soft, nontender, non-distended  MS: No edema  Patient Profile    [name] is a 83 y.o. female has hx of  mild non-obstructive CAD on cardiac catheterization in 10/2014, chronic diastolic CHF, interstitial lung disease on home O2, prior PE/ DVT not anticoagulation given prior GI bleed, hypertension, hyperlipidemia, type 2 diabetes mellitus, hypothyroidism, CKD stage II, sigmoid colon stricture s/p diverting loop colostomy on 07/17/2023, chronic anemia, rheumatoid arthritis, and dementia.  Cardiology asked to see due to tachycardia  Assessment & Plan .     Sinus  tachycardia Not on telemetry but does not feel tachycardic based off radial pulse.  No evidence of SVT.  Normal TSH. Continue Lopressor 50 mg twice daily; can switch back to Toprol-XL on DC. Sinus tachycardia is a normal response to physiologic stress, pain, etc. Treat the cause of tachycardia when possible.   Atypical chest pain with elevated troponins Nonobstructive CAD Left heart catheterization in 2016 indicating nonobstructive disease.  She is having GI related stomach pain that has since resolved today.  Also previously reproducible with palpation.  EKG without  any acute ischemic changes.  Troponins 32-28.  Not in the pattern of ACS.  Do not recommend ischemic evaluation at this time.  Chronic HFpEF Had some mild lower extremity edema restarted on p.o. Lasix 40 mg.  Has done well with this but also has had issues with hypotension so we are titrating back medications slowly.  Overall looks to be euvolemic. Started on spironolactone 12.5 mg.  Blood pressure has been between 140s to 160s.  Suspect she can tolerate full dose.  If BP remains to be elevated would change to full dose. On Lasix 40 mg daily  Hypokalemia Potassium 3.8 today. Getting supplemented with potassium 40 mEq twice daily.    Hypertension She is elevated here but has had history of hypotension at her last admission which was felt related to hypovolemia and adrenal sufficiency with chronic use of steroids.  Adding back therapy conservatively.  Otherwise, per primary team.  - Sigmoid colon stricture s/p diverting loop colostomy on 2/28 - Interstitial lung disease on home O2 - History of PE/DVT: not on anticoagulation given prior GI bleed - Hyperlipidemia - Type 2 diabetes mellitus - Hypothyroidism - CKD stage II - Anemia - Dementia   Cardiology will sign off.  For questions or updates, please contact [facility] Please consult [email or web address] for contact info under        Signed, [name] Small, MD

## 2023-07-25 NOTE — Consult Note (Signed)
 WOC Nurse ostomy follow up With move to rehab, patient had fallen off follow up list for our team.  Teaching will resume Monday, 07/27/23. Will follow.  Mike Gip MSN, RN, FNP-BC CWON Wound, Ostomy, Continence Nurse Outpatient [facility] [phone] Pager [phone]

## 2023-07-26 DIAGNOSIS — I10 Essential (primary) hypertension: Secondary | ICD-10-CM

## 2023-07-26 DIAGNOSIS — R5381 Other malaise: Secondary | ICD-10-CM | POA: Diagnosis not present

## 2023-07-26 LAB — BASIC METABOLIC PANEL
Anion gap: 7 (ref 5–15)
BUN: 6 mg/dL — ABNORMAL LOW (ref 8–23)
CO2: 30 mmol/L (ref 22–32)
Calcium: 7.9 mg/dL — ABNORMAL LOW (ref 8.9–10.3)
Chloride: 106 mmol/L (ref 98–111)
Creatinine, Ser: 0.64 mg/dL (ref 0.44–1.00)
GFR, Estimated: >60 mL/min (ref >60)
Glucose, Bld: 98 mg/dL (ref 70–99)
Potassium: 3.8 mmol/L (ref 3.5–5.1)
Sodium: 143 mmol/L (ref 135–145)

## 2023-07-26 NOTE — Progress Notes (Signed)
 PROGRESS NOTE   Subjective/Complaints:  Pt doing well, slept well, pain managed, ostomy output has been pretty consistent but no stool documentation since yesterday morning. Urinating fine. No other complaints or concerns today.    ROS: +chronic pain, +fatigue, +lower extremity edema- improved  Pt denies SOB, abd pain, CP, N/V/C/D, and vision changes   Objective:   No results found. No results for input(s): "WBC", "HGB", "HCT", "PLT" in the last 72 hours.  Recent Labs    07/25/23 0612 07/26/23 0759  NA 142 143  K 3.8 3.8  CL 108 106  CO2 29 30  GLUCOSE 89 98  BUN 6* 6*  CREATININE 0.67 0.64  CALCIUM 7.7* 7.9*    Intake/Output Summary (Last 24 hours) at 07/26/2023 1135 Last data filed at 07/26/2023 0845 Gross per 24 hour  Intake 118 ml  Output --  Net 118 ml        Physical Exam: Vital Signs Blood pressure (!) 150/83, pulse 84, temperature 98 F (36.7 C), resp. rate 18, weight 96.2 kg, SpO2 100%.   General: awake, alert, appropriate, sitting up in w/c;  NAD HENT: conjugate gaze; oropharynx moist CV: regular rate and rhythm; no JVD Pulmonary: CTA B/L; no W/R/R- good air movement GI: soft, NT, ND, (+)BS, ostomy L abdomen Psychiatric: appropriate- interactive  PRIOR EXAMS: Neurological: Ox3  Mental Status: AAOx3, memory grossly intact, fund of knowledge appropriate Speech/Languate: Follows commands CRANIAL NERVES: Cranial nerves II through XII grossly intact     MOTOR: RUE: 3/5 Deltoid, 4/5 Biceps, 4/5 Triceps,4/5 Grip LUE: 3/5 Deltoid, 4/5 Biceps, 4/5 Triceps, 4/5 Grip RLE: HF 4-/5, KE 4/5, ADF 4/5, APF 4/5 LLE: HF 4-/5, KE 4/5, ADF 4/5, APF 4/5   SENSORY: Normal to touch all 4 extremities   Coordination: Normal finger to nose, no tremor, no dysmetria, stable 3/7  Assessment/Plan: 1. Functional deficits which require 3+ hours per day of interdisciplinary therapy in a comprehensive inpatient  rehab setting. Physiatrist is providing close team supervision and 24 hour management of active medical problems listed below. Physiatrist and rehab team continue to assess barriers to discharge/monitor patient progress toward functional and medical goals  Care Tool:  Bathing    Body parts bathed by patient: Left arm, Chest, Abdomen, Face   Body parts bathed by helper: Right arm, Front perineal area, Buttocks, Right upper leg, Left upper leg, Right lower leg, Left lower leg     Bathing assist Assist Level: Maximal Assistance - Patient 24 - 49%     Upper Body Dressing/Undressing Upper body dressing   What is the patient wearing?: Pull over shirt    Upper body assist Assist Level: Moderate Assistance - Patient 50 - 74%    Lower Body Dressing/Undressing Lower body dressing      What is the patient wearing?: Incontinence brief, Pants     Lower body assist Assist for lower body dressing: Total Assistance - Patient < 25%     Toileting Toileting    Toileting assist Assist for toileting: Maximal Assistance - Patient 25 - 49%     Transfers Chair/bed transfer  Transfers assist  Chair/bed transfer activity did not occur: Safety/medical concerns (fatigue, pain)  Chair/bed  transfer assist level: Minimal Assistance - Patient > 75%     Locomotion Ambulation   Ambulation assist   Ambulation activity did not occur: Safety/medical concerns (fatigue, pain)  Assist level: Minimal Assistance - Patient > 75% Assistive device: Walker-rolling Max distance: 60ft   Walk 10 feet activity   Assist  Walk 10 feet activity did not occur: Safety/medical concerns (fatigue, pain)        Walk 50 feet activity   Assist Walk 50 feet with 2 turns activity did not occur: Safety/medical concerns (fatigue, pain)         Walk 150 feet activity   Assist Walk 150 feet activity did not occur: Safety/medical concerns (fatigue, pain)         Walk 10 feet on uneven surface   activity   Assist Walk 10 feet on uneven surfaces activity did not occur: Safety/medical concerns (fatigue, pain)         Wheelchair     Assist Is the patient using a wheelchair?: Yes Type of Wheelchair: Manual Wheelchair activity did not occur: Safety/medical concerns (fatigue, pain)         Wheelchair 50 feet with 2 turns activity    Assist    Wheelchair 50 feet with 2 turns activity did not occur: Safety/medical concerns (fatigue, pain)       Wheelchair 150 feet activity     Assist  Wheelchair 150 feet activity did not occur: Safety/medical concerns (fatigue, pain)       Blood pressure (!) 150/83, pulse 84, temperature 98 F (36.7 C), resp. rate 18, weight 96.2 kg, SpO2 100%.  Medical Problem List and Plan: 1. Functional deficits secondary to septic shock/debility due to symptomatic chronic sigmoid colon stricture.  Status post diverting loop ostomy 2/28             -patient may shower if ostomy covered             -ELOS/Goals: 12-14, Min A to sup PT/OT             -Grounds pass ordered  Con't CIR PT and OT 2.  Antithrombotics: -DVT/anticoagulation:  Pharmaceutical: Lovenox 40mg  daily             -antiplatelet therapy: N/A  3. Pain Management/chronic pain: Neurontin 300 mg twice daily, Voltaren gel 2 g 3 times daily, Lidoderm patch as directed, MS Contin 15 mg every 12 hours, oxycodone changed to MRIR BID prn, Robaxin as needed  3/8- pain doing much better last 2 days- con't regimen 4. Mood/Behavior/Sleep: Cymbalta 60 mg daily             -antipsychotic agents: N/A 5. Neuropsych/cognition: This patient is capable of making decisions on her own behalf.  6. Skin/Wound Care: Routine skin checks  7. Fluids/Electrolytes/Nutrition: Routine in and outs with follow-up chemistries             Hypokalemia- Recheck labs  3/8- K+ up to 3.8  -07/26/23 labs today stable, monitor weekly  8.  Hypotension.  Resolved.  Midodrine discontinued.  Toprol-XL 50 mg  daily.  Home Benicar/hydralazine on hold resumed as needed.   -07/26/23 BPs a bit higher today but have been sporadically normal; monitor trend but may need to restart meds Vitals:   07/23/23 2246 07/24/23 0215 07/24/23 0550 07/24/23 0757  BP: (!) 157/83 118/81 (!) 145/79 (!) 148/91   07/24/23 1421 07/24/23 1938 07/25/23 0540 07/25/23 0954  BP: 135/86 (!) 152/90 125/74 (!) 149/92   07/25/23 1540 07/25/23 2045  07/25/23 2048 07/26/23 0448  BP: (!) 142/92 (!) 164/87 (!) 164/98 (!) 150/83     9.  Rheumatoid arthritis.  Chronic prednisone 10mg  daily resumed. Leflunomide/Plaquenil currently on hold- recommended discussion of resumption with her outpatient PCP to minimize immune suppression post-operatively  10.  Hyperlipidemia.  Lipitor 10mg  daily  11.  Diabetes mellitus type 2 with peripheral neuropathy.  Hemoglobin A1c 5.6.  SSI.              -CBGs well controlled  3/8- Cbgs adequate control- con't regimen -07/26/23 CBGs not being done, but had good control before, SSI is also d/c'd so I'll let weekday team decide if this is needed still.   CBG (last 3)  Recent Labs    07/24/23 0822 07/24/23 1650  GLUCAP 105* 149*    12.  Hypothyroidism.  Synthroid daily  13.  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  Chronic oxygen.  3 L at baseline prior to admission.  Prophylactic Zithromax resumed  14.  Chronic diastolic congestive heart failure.  Lasix 40mg  daily. Monitor for any signs of fluid overload. Daily weights  3/8- no weight today- yesterday 97.3 kg which was down  -07/26/23 wt stable/down, monitor Filed Weights   07/24/23 0504 07/25/23 0717 07/26/23 0449  Weight: 97.3 kg 96.3 kg 96.2 kg    15.  CKD stage III.  Follow-up chemistries. Last Cr 0.66 3/3  3/8- Cr 0.66   -07/26/23 Cr 0.64, go to weekly labs  16.  Iron deficiency anemia.  Follow-up CBC Monday   17.  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  Status post gamma knife 10/13.   Follow-up outpatient  18.  Morbid Obesity.  BMI 35.42.  Dietary follow-up, d/c feeding supplement  3/8- BMI 38 19. HLD. continue atorvastatin 10mg  daily  20. Hemorrhoid: tucks ordered, continue  21. Fatigue: B12 injection ordered 3/5  22. Tachycardia: consulted cardiology and no changes recommended, lopressor changed to 50mg  BID-- improved  23. Chest pain: troponin ordered, discussed with cardiology and felt to be more epigastric  24. Lower extremity edema: lasix ordered, continue 40mg  daily  25. HTN: lasix ordered, continue 40mg  daily 3/8- BP 149/92 this AM, but otherwise has been running 120s systolic- con't regimen -07/26/23 SEE #8 ABOVE  26. Colostomy-  3/8- pt has diverting loop colostomy- needs Ostomy education- placed consult     LOS: 6 days A FACE TO FACE EVALUATION WAS PERFORMED  [address] 07/26/2023, 11:35 AM

## 2023-07-26 NOTE — Progress Notes (Signed)
 Physical Therapy Session Note  Patient Details  Name: [name] MRN: [id number] Date of Birth: 12-Jul-1940  Today's Date: 07/26/2023 PT Individual Time: 1300-1343 PT Individual Time Calculation (min): 43 min   Short Term Goals: Week 1:  PT Short Term Goal 1 (Week 1): pt will perform bed mobility with min A consistantly PT Short Term Goal 2 (Week 1): pt will transfer bed<>chair with LRAD and CGA PT Short Term Goal 3 (Week 1): pt will ambulate 17ft with LRAD and CGA  Skilled Therapeutic Interventions/Progress Updates:      Therapy Documentation Precautions:  Precautions Precautions: Fall Precaution/Restrictions Comments: ostomy, monitor O2 and HR Restrictions Weight Bearing Restrictions Per Provider Order: No  Pt agreeable to PT session with emphasis on transfers, bed mobility and general LE strength and conditioning training. Pt without reports of pain.   Pt requested assistance with toileting, max A x 1 with sit<>stand with STEDY from recliner, incontinent of bladder and dependent for peri-care and doffing/donning pants. Max A x 2 with sit<>stand STEDY from toilet seat and mod A sit to lying. CGA/min A rolling in bilateral directions pericare.   PT notified nurse ostomy bag full and pt agreeable to bed level therapeutic exercise consisting of R LE SLR x 30 and L LE SAQ x 30 with intermittent rest breaks for fatigue.   Pt left semi-reclined in bed, all needs in reach, alarm on.     Therapy/Group: Individual Therapy  [name] [name] PT, DPT  07/26/2023, 3:31 PM

## 2023-07-26 NOTE — Progress Notes (Signed)
 Physical Therapy Session Note  Patient Details  Name: [name] MRN: [id number] Date of Birth: August 08, 1940  Today's Date: 07/26/2023 PT Individual Time: 0830-0925 PT Individual Time Calculation (min): 55 min   Short Term Goals: Week 1:  PT Short Term Goal 1 (Week 1): pt will perform bed mobility with min A consistantly PT Short Term Goal 2 (Week 1): pt will transfer bed<>chair with LRAD and CGA PT Short Term Goal 3 (Week 1): pt will ambulate 22ft with LRAD and CGA  Skilled Therapeutic Interventions/Progress Updates:    Chart reviewed and pt agreeable to therapy. Pt received seated EOB with NT completing STEDY transfer to [facility] and with 5/10 c/o pain in R hip. Session focused on functional transfers and amb to promote safe home access. Pt initiated session with sit to stand practice using maxA + RW. Pt only able to complete partial stand. Pt completed 4 other attempts and given VC for positioning to optimize biomechanics. Pt noted to increase partial standing height with each attempt, but did not complete full upright stand. Pt then completed stand in STEDY with maxA. In stanidng, pt completed multiple rounds of stands with minA and lateral weight shifts and mini squats using minA. Pt required multiple rest between standing exercise rounds. At end of session, pt was left seated in [facility] with alarm engaged, nurse call bell and all needs in reach.     Therapy Documentation Precautions:  Precautions Precautions: Fall Precaution/Restrictions Comments: ostomy, monitor O2 and HR Restrictions Weight Bearing Restrictions Per Provider Order: No General:     Therapy/Group: Individual Therapy  Dionne Milo, PT, DPT 07/26/2023, 9:26 AM

## 2023-07-26 NOTE — Progress Notes (Signed)
 Occupational Therapy Session Note  Patient Details  Name: [name] MRN: [id number] Date of Birth: 13-Aug-1940  Today's Date: 07/26/2023 OT Individual Time: 1015-1100 OT Individual Time Calculation (min): 45 min    Short Term Goals: Week 1:  OT Short Term Goal 1 (Week 1): Pt will complete sit > stand in prep for ADL with CGA using LRAD OT Short Term Goal 2 (Week 1): Pt will complete toilet transfer with CGA using LRAD OT Short Term Goal 3 (Week 1): Pt will complete 1/3 toileting steps with CGA for balance OT Short Term Goal 4 (Week 1): Pt will thread LB clothing with supervision using AE PRN  Skilled Therapeutic Interventions/Progress Updates:     (1st) OT Treatment Session:  Patient seated at w/c LOF at the time of arrival.  Patient indicated that she rested well last night. Patient reported no pain response at the time of treatment. Patient in agreement with completing OT session with focus on sit to stand, core strengthening , and UB/ LB task performance. Patient instructed in relaxation breathing to improve compliance. The pt was able to demonstrate effective carryover for relaxation breathing. The pt was able to complete a simulated task in UB dressing using medium grade theraband 3 x at [facility] secondary to challenges with manipulation and bilateral hand coordination. The pt went on to complete sit to stand 4x using the RW and placement of one arm of the w/c  and the other arm on the RW for additional balance.  The pt required ModA for coming from sit to stand. The pt was instructed to take rest breaks as needed, she required 1 rest break with each attempt.  The pt was encouraged to incorporate relaxation breathing for compliance. The pt finished the session by completing a ball toss exercise 3x by tossing a ball over handed to the instructor retrieving the ball while seated at chair LOF, with 1 rest break following the exercise.   At the end of the session ,the patient remained at w/c LOF  with her chair alarm in place, and  call light and bedside table within reach.  All additional needs of the pt were addressed prior to exiting the room     (2nd OT Treatment Session: The pt was returned to bed LOF after lunch with  2L of 02 in place  The pt reported no pain response at the time of treatment and was in agreement with completing UB exercise if she could complete it  at bed LOF.  The pt went on to 2 sets of 10 using a 1lb dowel for  completing  shld flexion, horizontal abduction, shld rotation, and lg circles with rest breaks as needed. The pt required  1 rest break with each exercise.  The pt was encouraged to incorporate relaxation breathing for greater compliance. The pt went on to complete long sitting at bed LOF by incorporating the bed rails without back support   4X  for a count of 10 with rest breaks as needed. The pt required 1 rest break following each exercise. At the end of the session, the call light and bedside table were both placed within reach with all additional needs addressed.  Therapy Documentation Precautions:  Precautions Precautions: Fall Precaution/Restrictions Comments: ostomy, monitor O2 and HR Restrictions Weight Bearing Restrictions Per Provider Order: No   Therapy/Group: Individual Therapy  Lavona Mound 07/26/2023, 4:13 PM

## 2023-07-27 ENCOUNTER — Encounter: Payer: Medicare Other | Admitting: Registered Nurse

## 2023-07-27 DIAGNOSIS — R5381 Other malaise: Secondary | ICD-10-CM | POA: Diagnosis not present

## 2023-07-27 LAB — BASIC METABOLIC PANEL
Anion gap: 4 — ABNORMAL LOW (ref 5–15)
BUN: 7 mg/dL — ABNORMAL LOW (ref 8–23)
CO2: 31 mmol/L (ref 22–32)
Calcium: 7.8 mg/dL — ABNORMAL LOW (ref 8.9–10.3)
Chloride: 107 mmol/L (ref 98–111)
Creatinine, Ser: 0.65 mg/dL (ref 0.44–1.00)
GFR, Estimated: >60 mL/min (ref >60)
Glucose, Bld: 93 mg/dL (ref 70–99)
Potassium: 3.8 mmol/L (ref 3.5–5.1)
Sodium: 142 mmol/L (ref 135–145)

## 2023-07-27 LAB — CBC WITH DIFFERENTIAL/PLATELET
Abs Immature Granulocytes: 0.02 10*3/uL (ref 0.00–0.07)
Basophils Absolute: 0.1 10*3/uL (ref 0.0–0.1)
Basophils Relative: 1 %
Eosinophils Absolute: 0.2 10*3/uL (ref 0.0–0.5)
Eosinophils Relative: 2 %
HCT: 30.9 % — ABNORMAL LOW (ref 36.0–46.0)
Hemoglobin: 9.4 g/dL — ABNORMAL LOW (ref 12.0–15.0)
Immature Granulocytes: 0 %
Lymphocytes Relative: 67 %
Lymphs Abs: 5.1 10*3/uL — ABNORMAL HIGH (ref 0.7–4.0)
MCH: 29.7 pg (ref 26.0–34.0)
MCHC: 30.4 g/dL (ref 30.0–36.0)
MCV: 97.8 fL (ref 80.0–100.0)
Monocytes Absolute: 0.7 10*3/uL (ref 0.1–1.0)
Monocytes Relative: 10 %
Neutro Abs: 1.5 10*3/uL — ABNORMAL LOW (ref 1.7–7.7)
Neutrophils Relative %: 20 %
Platelets: 198 10*3/uL (ref 150–400)
RBC: 3.16 MIL/uL — ABNORMAL LOW (ref 3.87–5.11)
RDW: 16.9 % — ABNORMAL HIGH (ref 11.5–15.5)
WBC: 7.5 10*3/uL (ref 4.0–10.5)
nRBC: 0 % (ref 0.0–0.2)

## 2023-07-27 NOTE — Consult Note (Signed)
 WOC Nurse ostomy consult note Stoma type/location: LUQ colostomy Stomal assessment/size: oval 1 cm x 2 cm  Peristomal assessment: denuded skin along bottom half  irritant dermatitis Treatment options for stomal/peristomal skin: stoma powder and skin prep to irritated skin  barrier ring and switch to 1 piece convex pouch.  Adding ostomy belt as well.  Output liquid brown stool Ostomy pouching: 1pc./ flexible convex with barrier ring and belt.   Education provided: Performed pouch change with patient and 2 daughters, 1 son in attendance. Discussed twice weekly pouch changes. Emptying when 1/3 full. Showering with or without pouch.  Patient able to open and roll closed pouch. Agrees to assist staff with emptying Pattern left at bedside for oval stoma.  Will change again Wednesday with family in attendance at 1100.  Wil order supplies.  Pouch (LAWSON # [id number]) barrier ring (LAWSON # [id number]) POwder (LAWSON # 6 ) Skin prep (LAWSON # [id number]) Ostomy belt (LAWSON # 621)  Enrolled patient in [employer] DC program: NO  Will today.  have not received kit.   Will follow.  Mike Gip MSN, RN, FNP-BC CWON Wound, Ostomy, Continence Nurse Outpatient [facility] [phone] Pager [phone]

## 2023-07-27 NOTE — Progress Notes (Signed)
 Physical Therapy Session Note  Patient Details  Name: [name] MRN: [id number] Date of Birth: 09/25/1940  Today's Date: 07/27/2023 PT Individual Time: 0850-0931 PT Individual Time Calculation (min): 41 min   Short Term Goals: Week 1:  PT Short Term Goal 1 (Week 1): pt will perform bed mobility with min A consistantly PT Short Term Goal 2 (Week 1): pt will transfer bed<>chair with LRAD and CGA PT Short Term Goal 3 (Week 1): pt will ambulate 61ft with LRAD and CGA  Skilled Therapeutic Interventions/Progress Updates:    Pt presents in room in recliner with nursing staff present for medication administration. Pt reports pain in R torso and bilateral shoulders. Pt requires significant time to take medication as pt reporting being short of breath following transfer. Session focused on therapeutic activities for positioning and tolerance to upright, transfer training, as well as therapeutic exercise for BLE muscle fiber recruitment and activity tolerance.  Therapist retrieves cushion for [facility] for improved positioning and improved tolerance to upright while seated in WC. Pt vitals assessed to be WNL: BP 120/71, HR 92. Pt completes stedy transfer with heavy mod assist. Pt transferred to [facility] via stedy, able to stand from elevated seated position with supervision to transition to sitting in WC.  Pt transported to day room, set up on kinetron. Pt completes interval training 30 sec work/rest for total 6 minutes at workload 80 cm/sec, completed for BLE strengthening and activity tolerance, pt educated during rest breaks on interval training to build overall endurance with pt verbalizing understanding.  Pt returns to room and remains seated in [facility] handoff to nursing staff at end of session.    Therapy Documentation Precautions:  Precautions Precautions: Fall Precaution/Restrictions Comments: ostomy, monitor O2 and HR Restrictions Weight Bearing Restrictions Per Provider Order:  No  Therapy/Group: Individual Therapy  Edwin Cap PT, DPT 07/27/2023, 9:33 AM

## 2023-07-27 NOTE — Progress Notes (Signed)
 Physical Therapy Session Note  Patient Details  Name: [name] MRN: [id number] Date of Birth: 09-28-1940  Today's Date: 07/27/2023 PT Individual Time: 5366-4403 PT Individual Time Calculation (min): 71 min   Short Term Goals: Week 1:  PT Short Term Goal 1 (Week 1): pt will perform bed mobility with min A consistantly PT Short Term Goal 2 (Week 1): pt will transfer bed<>chair with LRAD and CGA PT Short Term Goal 3 (Week 1): pt will ambulate 32ft with LRAD and CGA  Skilled Therapeutic Interventions/Progress Updates:   Received pt semi-reclined in bed, pt agreeable to PT treatment, and reported pain in R hip, low back, and surrounding ostomy site (unrated and premedicated). Session with emphasis on functional mobility/transfers, generalized strengthening and endurance, simulated car transfers, toileting, and ambulation. Pt transferred semi-reclined<>R sidelying without assist, but required min A to transition from R sidelying<>sitting upright on EOB with increased time/effort and cues for technique - discussed recommendation for hospital bed at discharge and pt in agreement. Donned shoes with max A, then stood from EOB with RW and min A and performed stand<>pivot transfer into WC with RW and min A (noted improvements in trunk extension).   Pt transported to/from room in [facility] dependently for time management purposes. Stood from [facility] with RW and min A and started to ambulate; limited by pt feeling like L ankle was "giving in" - pulled WC behind pt to sit. Took seated rest break, then stood again with RW and min A and started to ambulate, this time limited by pain in L knee - pt immediately sitting down. Requested L knee sleeve from MD - pt declining any additional attempts at ambulating. Of note, pt with increased trunk flexion with continued stands (most likely due to pain and fatigue).   In ortho gym, pt performed simulated car transfer with RW and mod A overall. Pt able to stand from [facility] and  pivot into car seat with min A, but required mod A for BLE management in/out of car, and mod A to stand from car seat. Pt reports not spending much time in the car and that either her son or daughter would be able to assist her.  Pt then reported urge to void - returned to room and performed stand<>pivot to/from toilet with bedside commode over top using RW and min A. Pt required total A for clothing management and able to void. Performed hygiene management without assist and pt reported discomfort in lower stomach. Upon inspecting, noted sweat with maceration under pannus - baby powder applied and requested inter-dry fabric from MD. Pt required seated rest break on commode after donning clothing and before transferring back to WC. Sat in WC at sink and performed hand hygiene withy set up assist.   Pt then performed the following seated exercises with emphasis on BLE strength/ROM: -active assisted heel slides on towel x30 bilaterally -knee extension 2x12 bilaterally Concluded session with pt sitting in WC, needs within reach, and seatbelt alarm on.   Therapy Documentation Precautions:  Precautions Precautions: Fall Precaution/Restrictions Comments: ostomy, monitor O2 and HR Restrictions Weight Bearing Restrictions Per Provider Order: No  Therapy/Group: Individual Therapy Marlana Salvage Zaunegger Blima Rich PT, DPT 07/27/2023, 6:51 AM

## 2023-07-27 NOTE — Plan of Care (Signed)
  Problem: Consults Goal: RH GENERAL PATIENT EDUCATION Description: See Patient Education module for education specifics. Outcome: Progressing   Problem: RH BOWEL ELIMINATION Goal: RH STG MANAGE BOWEL WITH ASSISTANCE Description: STG Manage Bowel with min Assistance. Outcome: Progressing   Problem: RH BLADDER ELIMINATION Goal: RH STG MANAGE BLADDER WITH ASSISTANCE Description: STG Manage Bladder With toileting Assistance Outcome: Progressing   Problem: RH SKIN INTEGRITY Goal: RH STG ABLE TO PERFORM INCISION/WOUND CARE W/ASSISTANCE Description: STG Able To Perform Incision/Wound Care With min Assistance. Outcome: Progressing   Problem: RH SAFETY Goal: RH STG ADHERE TO SAFETY PRECAUTIONS W/ASSISTANCE/DEVICE Description: STG Adhere to Safety Precautions With cues Assistance/Device. Outcome: Progressing

## 2023-07-27 NOTE — Progress Notes (Signed)
 PROGRESS NOTE   Subjective/Complaints: Weight decreased by 0.6kg Has some shortness of breath this morning- off O2, about to start PT, discussed she can use O2 if needed   ROS: +chronic pain, +fatigue, +lower extremity edema- improved, +shortness of breath  Pt denies SOB, abd pain, CP, N/V/C/D, and vision changes   Objective:   No results found. Recent Labs    07/27/23 0611  WBC 7.5  HGB 9.4*  HCT 30.9*  PLT 198    Recent Labs    07/26/23 0759 07/27/23 0611  NA 143 142  K 3.8 3.8  CL 106 107  CO2 30 31  GLUCOSE 98 93  BUN 6* 7*  CREATININE 0.64 0.65  CALCIUM 7.9* 7.8*    Intake/Output Summary (Last 24 hours) at 07/27/2023 1610 Last data filed at 07/26/2023 1701 Gross per 24 hour  Intake 118.5 ml  Output --  Net 118.5 ml        Physical Exam: Vital Signs Blood pressure 120/71, pulse 93, temperature 97.7 F (36.5 C), resp. rate 17, weight 91.8 kg, SpO2 98%.   General: awake, alert, appropriate, sitting up in w/c;  NAD HENT: conjugate gaze; oropharynx moist CV: regular rate and rhythm; no JVD Pulmonary: CTA B/L; no W/R/R- good air movement GI: soft, NT, ND, (+)BS, ostomy L abdomen Psychiatric: appropriate- interactive  PRIOR EXAMS: Neurological: Ox3  Mental Status: AAOx3, memory grossly intact, fund of knowledge appropriate Speech/Languate: Follows commands CRANIAL NERVES: Cranial nerves II through XII grossly intact     MOTOR: RUE: 3/5 Deltoid, 4/5 Biceps, 4/5 Triceps,4/5 Grip LUE: 3/5 Deltoid, 4/5 Biceps, 4/5 Triceps, 4/5 Grip RLE: HF 4-/5, KE 4/5, ADF 4/5, APF 4/5 LLE: HF 4-/5, KE 4/5, ADF 4/5, APF 4/5   SENSORY: Normal to touch all 4 extremities   Coordination: Normal finger to nose, no tremor, no dysmetria, stable 3/10  Assessment/Plan: 1. Functional deficits which require 3+ hours per day of interdisciplinary therapy in a comprehensive inpatient rehab setting. Physiatrist is  providing close team supervision and 24 hour management of active medical problems listed below. Physiatrist and rehab team continue to assess barriers to discharge/monitor patient progress toward functional and medical goals  Care Tool:  Bathing    Body parts bathed by patient: Left arm, Chest, Abdomen, Face   Body parts bathed by helper: Right arm, Front perineal area, Buttocks, Right upper leg, Left upper leg, Right lower leg, Left lower leg     Bathing assist Assist Level: Maximal Assistance - Patient 24 - 49%     Upper Body Dressing/Undressing Upper body dressing   What is the patient wearing?: Pull over shirt    Upper body assist Assist Level: Moderate Assistance - Patient 50 - 74%    Lower Body Dressing/Undressing Lower body dressing      What is the patient wearing?: Incontinence brief, Pants     Lower body assist Assist for lower body dressing: Total Assistance - Patient < 25%     Toileting Toileting    Toileting assist Assist for toileting: Maximal Assistance - Patient 25 - 49%     Transfers Chair/bed transfer  Transfers assist  Chair/bed transfer activity did not occur: Safety/medical concerns (  fatigue, pain)  Chair/bed transfer assist level: Minimal Assistance - Patient > 75%     Locomotion Ambulation   Ambulation assist   Ambulation activity did not occur: Safety/medical concerns (fatigue, pain)  Assist level: Minimal Assistance - Patient > 75% Assistive device: Walker-rolling Max distance: 52ft   Walk 10 feet activity   Assist  Walk 10 feet activity did not occur: Safety/medical concerns (fatigue, pain)        Walk 50 feet activity   Assist Walk 50 feet with 2 turns activity did not occur: Safety/medical concerns (fatigue, pain)         Walk 150 feet activity   Assist Walk 150 feet activity did not occur: Safety/medical concerns (fatigue, pain)         Walk 10 feet on uneven surface  activity   Assist Walk 10 feet  on uneven surfaces activity did not occur: Safety/medical concerns (fatigue, pain)         Wheelchair     Assist Is the patient using a wheelchair?: Yes Type of Wheelchair: Manual Wheelchair activity did not occur: Safety/medical concerns (fatigue, pain)         Wheelchair 50 feet with 2 turns activity    Assist    Wheelchair 50 feet with 2 turns activity did not occur: Safety/medical concerns (fatigue, pain)       Wheelchair 150 feet activity     Assist  Wheelchair 150 feet activity did not occur: Safety/medical concerns (fatigue, pain)       Blood pressure 120/71, pulse 93, temperature 97.7 F (36.5 C), resp. rate 17, weight 91.8 kg, SpO2 98%.  Medical Problem List and Plan: 1. Functional deficits secondary to septic shock/debility due to symptomatic chronic sigmoid colon stricture.  Status post diverting loop ostomy 2/28             -patient may shower if ostomy covered             -ELOS/Goals: 12-14, Min A to sup PT/OT             -Grounds pass ordered  Con't CIR PT and OT 2.  Antithrombotics: -DVT/anticoagulation:  Pharmaceutical: Lovenox 40mg  daily             -antiplatelet therapy: N/A  3. Pain Management/chronic pain: Neurontin 300 mg twice daily, Voltaren gel 2 g 3 times daily, Lidoderm patch as directed, MS Contin 15 mg every 12 hours, oxycodone changed to MRIR BID prn, Robaxin as needed  3/8- pain doing much better last 2 days- con't regimen 4. Mood/Behavior/Sleep: Cymbalta 60 mg daily             -antipsychotic agents: N/A 5. Neuropsych/cognition: This patient is capable of making decisions on her own behalf.  6. Skin/Wound Care: Routine skin checks  7. Fluids/Electrolytes/Nutrition: Routine in and outs with follow-up chemistries             Hypokalemia- Recheck labs  3/8- K+ up to 3.8  -07/26/23 labs today stable, monitor weekly  8.  Hypotension.  Resolved.  Midodrine discontinued.  Toprol-XL 50 mg daily.  Home Benicar/hydralazine on hold  resumed as needed.   -07/26/23 BPs a bit higher today but have been sporadically normal; monitor trend but may need to restart meds Vitals:   07/24/23 0757 07/24/23 1421 07/24/23 1938 07/25/23 0540  BP: (!) 148/91 135/86 (!) 152/90 125/74   07/25/23 0954 07/25/23 1540 07/25/23 2045 07/25/23 2048  BP: (!) 149/92 (!) 142/92 (!) 164/87 (!) 164/98  07/26/23 0448 07/26/23 1932 07/27/23 0420 07/27/23 0900  BP: (!) 150/83 121/73 132/74 120/71     9.  Rheumatoid arthritis.  Chronic prednisone 10mg  daily resumed. Leflunomide/Plaquenil currently on hold- recommended discussion of resumption with her outpatient PCP to minimize immune suppression post-operatively  10.  Hyperlipidemia.  Lipitor 10mg  daily  11.  Diabetes mellitus type 2 with peripheral neuropathy.  Hemoglobin A1c 5.6.  SSI.              -CBGs well controlled  3/8- Cbgs adequate control- con't regimen -07/26/23 CBGs not being done, but had good control before, SSI is also d/c'd so I'll let weekday team decide if this is needed still.   CBG (last 3)  Recent Labs    07/24/23 1650  GLUCAP 149*    12.  Hypothyroidism.  Synthroid daily  13.  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  Chronic oxygen.  3 L at baseline prior to admission.  Prophylactic Zithromax resumed  14.  Chronic diastolic congestive heart failure.  Lasix 40mg  daily. Monitor for any signs of fluid overload. Daily weights  3/8- no weight today- yesterday 97.3 kg which was down  -07/26/23 wt stable/down, monitor Filed Weights   07/25/23 0717 07/26/23 0449 07/27/23 0500  Weight: 96.3 kg 96.2 kg 91.8 kg    15.  CKD stage III.  Follow-up chemistries. Last Cr 0.66 3/3  3/8- Cr 0.66   -07/26/23 Cr 0.64, go to weekly labs  16.  Iron deficiency anemia.  Follow-up CBC Monday   17.  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  Status post gamma knife 10/13.  Follow-up outpatient  18.  Morbid Obesity.  BMI 35.42.  Dietary follow-up,  d/c feeding supplement  3/8- BMI 38 19. HLD. continue atorvastatin 10mg  daily  20. Hemorrhoid: tucks ordered, continue  21. Fatigue: B12 injection ordered 3/5  22. Tachycardia: continue lopressor changed to 50mg  BID, HR reviewed and has improved  23. Chest pain: discussed that this has resolved  24. Lower extremity edema: lasix ordered, continue 40mg  daily  25. HTN: lasix ordered, continue 40mg  daily Weight reviewed and is decreased, continue this dose since having some shortness of breath  26. Colostomy- has diverting loop colostomy- needs Ostomy education- placed consult     LOS: 7 days A FACE TO FACE EVALUATION WAS PERFORMED  Drema Pry Freedom Lopezperez 07/27/2023, 9:37 AM

## 2023-07-27 NOTE — Progress Notes (Signed)
 Occupational Therapy Weekly Progress Note  Patient Details  Name: [name] MRN: [id number] Date of Birth: January 06, 1941  Beginning of progress report period: July 21, 2023 End of progress report period: July 27, 2023   Patient has met 1 of 4 short term goals.  Pt is making slow but effortful progress towards LTGs. She is able to bathe at an overall mod A level, dress with mod/max A and requires max assist for toileting tasks. Pt continues to demonstrate BUE ROM/strength deficits, as well as functional endurance, dynamic standing balance, cognitive (awareness, problem solving, initiation) and GM coordination deficits resulting in difficulty completing BADL tasks without increased physical assist. Recently pt has been limited by elevated HR, however has been titrated to RA from 3L and has tolerated the progression well with use of O2 only at night. Pt will benefit from continued skilled OT services to focus on mentioned deficits. Family ed not initiated as of date and will be needed in prep for DC.   Patient continues to demonstrate the following deficits: muscle weakness, decreased cardiorespiratoy endurance, decreased coordination, decreased initiation and decreased problem solving, and decreased standing balance and therefore will continue to benefit from skilled OT intervention to enhance overall performance with BADL and Reduce care partner burden.  Patient not progressing toward long term goals.  See goal revision..  Plan of care revisions: downgraded to CGA for transfers, min A for ADLs.  OT Short Term Goals Week 1:  OT Short Term Goal 1 (Week 1): Pt will complete sit > stand in prep for ADL with CGA using LRAD OT Short Term Goal 1 - Progress (Week 1): Not met OT Short Term Goal 2 (Week 1): Pt will complete toilet transfer with CGA using LRAD OT Short Term Goal 2 - Progress (Week 1): Met OT Short Term Goal 3 (Week 1): Pt will complete 1/3 toileting steps with CGA for balance OT  Short Term Goal 3 - Progress (Week 1): Not met OT Short Term Goal 4 (Week 1): Pt will thread LB clothing with supervision using AE PRN OT Short Term Goal 4 - Progress (Week 1): Not met Week 2:  OT Short Term Goal 1 (Week 2): Pt will complete sit > stand in prep for ADL with CGA using LRAD OT Short Term Goal 2 (Week 2): Pt will complete 1/3 toileting steps with CGA for balance OT Short Term Goal 3 (Week 2): Pt will thread LB clothing with supervision using AE PRN    Melvyn Novas, MS, OTR/L  07/27/2023, 9:21 AM

## 2023-07-27 NOTE — Progress Notes (Signed)
 Occupational Therapy Session Note  Patient Details  Name: [name] MRN: [id number] Date of Birth: [birth year]  Today's Date: 07/27/2023 OT Individual Time: 1315-1345 OT Individual Time Calculation (min): 30 min  and Today's Date: 07/27/2023 OT Missed Time: 15 Minutes Missed Time Reason: Nursing care   Short Term Goals: Week 2:  OT Short Term Goal 1 (Week 2): Pt will complete sit > stand in prep for ADL with CGA using LRAD OT Short Term Goal 2 (Week 2): Pt will complete 1/3 toileting steps with CGA for balance OT Short Term Goal 3 (Week 2): Pt will thread LB clothing with supervision using AE PRN  Skilled Therapeutic Interventions/Progress Updates:    Pt missed 15 mins skilled OT services at beginning of session 2/2 nursing care. OT intervention with focus on sit<>stand and BUE therex for strengthing and general conditioning. Sit<>stand X 2 with mod A. Pt able to stand for ~15 secs. BUE therex with .5kg ball-chest presses 3x8 with rest breaks. Pt tossed small ball to OTA 4x10. Pt SOB with all activity after each set. O2 sats>92% throughout session. Extended rest breaks. Pt returned to room and remained in w/c with all needs within reach. Belt alarm activated. Nursing present. Daughter present.   Therapy Documentation Precautions:  Precautions Precautions: Fall Precaution/Restrictions Comments: ostomy, monitor O2 and HR Restrictions Weight Bearing Restrictions Per Provider Order: No General: General OT Amount of Missed Time: 15 Minutes Pain: Pt reports 6/10 generalized pain and increased Lt shoulder discomfort with activity; rest emotional support   Therapy/Group: Individual Therapy  Rich Brave 07/27/2023, 2:50 PM

## 2023-07-27 NOTE — Progress Notes (Signed)
 Orthopedic Tech Progress Note Patient Details:  [name] 10-31-1940 [id number]  Ortho Devices Type of Ortho Device: Knee Sleeve Ortho Device/Splint Location: LLE Ortho Device/Splint Interventions: Ordered  left in room on bedside table while family there   Post Interventions Patient Tolerated: Well Instructions Provided: Care of device  Donald Pore 07/27/2023, 11:32 AM

## 2023-07-27 NOTE — Progress Notes (Signed)
 Occupational Therapy Session Note  Patient Details  Name: [name] MRN: [id number] Date of Birth: 04/01/1941  Today's Date: 07/27/2023 OT Individual Time: 7829-5621 OT Individual Time Calculation (min): 40 min    Short Term Goals: Week 1:  OT Short Term Goal 1 (Week 1): Pt will complete sit > stand in prep for ADL with CGA using LRAD OT Short Term Goal 2 (Week 1): Pt will complete toilet transfer with CGA using LRAD OT Short Term Goal 3 (Week 1): Pt will complete 1/3 toileting steps with CGA for balance OT Short Term Goal 4 (Week 1): Pt will thread LB clothing with supervision using AE PRN  Skilled Therapeutic Interventions/Progress Updates:  Skilled OT intervention completed with focus on ADL retraining. Pt received upright in bed, agreeable to session. R hip pain reported; nursing present for meds. OT offered rest breaks and repositioning throughout for pain reduction.  Pt requested to wash up, and inquired if bathing was okay to do with nursing prior to therapies. Educated about OT purpose, however encouraged her that bathing could be done with nursing staff as well, with focus more on functional mobility/endurance with OT if pt's desire as this is her greatest barrier.   Discussed bed level bathing for LB/periareas to increase access 2/2 body habitus. Pt able to roll R<>L with min A using bed rails with cues for bending each knee to allow gravity to assist with the roll. Able to wash upper thighs, abdominal skin folds, and periareas with rolling technique however did need mod A to maintain side lying for posterior pericare. Donned brief max A, then pants with mod A.   Transitioned > L side of bed with min A for trunk elevation only via HHA. Able to doff shirt/bra with mod A, wash UB with set up A, then mod A for bra and shirt as nursing present for wound assessment. SPO2 88% on RA however did improve to 92% with pursed lip breathing, HR 105 bpm. Direct care handoff to nursing at  end of session with all needs met.    Therapy Documentation Precautions:  Precautions Precautions: Fall Precaution/Restrictions Comments: ostomy, monitor O2 and HR Restrictions Weight Bearing Restrictions Per Provider Order: No    Therapy/Group: Individual Therapy  Melvyn Novas, MS, OTR/L  07/27/2023, 8:48 AM

## 2023-07-28 DIAGNOSIS — R5381 Other malaise: Secondary | ICD-10-CM | POA: Diagnosis not present

## 2023-07-28 MED ORDER — METOPROLOL TARTRATE 50 MG PO TABS
62.5000 mg | ORAL_TABLET | Freq: Two times a day (BID) | ORAL | Status: DC
  Administered 2023-07-28 – 2023-08-04 (×14): 62.5 mg via ORAL
  Filled 2023-07-28 (×15): qty 1

## 2023-07-28 MED ORDER — LEFLUNOMIDE 10 MG PO TABS
20.0000 mg | ORAL_TABLET | Freq: Every day | ORAL | Status: DC
  Administered 2023-07-28 – 2023-08-04 (×8): 20 mg via ORAL
  Filled 2023-07-28 (×8): qty 2

## 2023-07-28 MED ORDER — ACETAMINOPHEN 500 MG PO TABS
1000.0000 mg | ORAL_TABLET | Freq: Three times a day (TID) | ORAL | Status: DC
Start: 2023-07-28 — End: 2023-08-04
  Administered 2023-07-28 – 2023-08-04 (×20): 1000 mg via ORAL
  Filled 2023-07-28 (×21): qty 2

## 2023-07-28 MED ORDER — ACETAMINOPHEN 650 MG RE SUPP
650.0000 mg | Freq: Three times a day (TID) | RECTAL | Status: DC
  Filled 2023-07-28 (×4): qty 1

## 2023-07-28 NOTE — Progress Notes (Signed)
 Physical Therapy Session Note  Patient Details  Name: [name] MRN: [id number] Date of Birth: January 03, 1941  Today's Date: 07/28/2023 PT Individual Time: 1400-1455 PT Individual Time Calculation (min): 55 min   Short Term Goals: Week 1:  PT Short Term Goal 1 (Week 1): pt will perform bed mobility with min A consistantly PT Short Term Goal 2 (Week 1): pt will transfer bed<>chair with LRAD and CGA PT Short Term Goal 3 (Week 1): pt will ambulate 24ft with LRAD and CGA  Skilled Therapeutic Interventions/Progress Updates:   Received pt sitting in WC, pt agreeable to PT treatment, and denied any pain at rest but severely limited by low back and L hip pain with standing and ambulating - RN and MD notified. Session with emphasis on functional mobility/transfers, generalized strengthening and endurance, dynamic standing balance/coordination, and gait training. Removed regular socks and slippers and donned non-skid socks dependently.  Pt transported to/from room in [facility] dependently for time management purposes. Pt performed all transfers with RW and min A throughout session. Pt ambulated 85ft x 2 and 98ft x 1 trial with RW and min A. Pt continues to be limited by increased low back pain limiting ambulation distance. Pt begins mostly upright, but after a few steps demonstrates severe trunk flexion, resting elbows onto RW and begins to sit.   Discussed pt's goals and current limitations. Pt reports she wants to be able to stand and walk short distances but is unsure if she can deal with current level of back pain. Discussed pain medication schedule and informed pt that if her goal is to ambulate, she will need to work through some of this discomfort. Pt then performed seated knee extension with 0.5lb ankle weights 2x20 bilaterally and hip flexion with 0.5lb ankle weight 2x20 bilaterally with emphasis on LE strength/ROM. Of note, pt counting incorrectly (over-counting) and with little awareness - encouraged  pt to count out loud, but pt still counting incorrectly.  Pt with flat affect this afternoon, difficulty with word finding, and appeared to have little motivation - treatment team made aware and discussed scheduling family education. Returned to room and concluded session with pt sitting in WC, needs within reach, and seatbelt alarm on.  Therapy Documentation Precautions:  Precautions Precautions: Fall Precaution/Restrictions Comments: ostomy, monitor O2 and HR Restrictions Weight Bearing Restrictions Per Provider Order: No  Therapy/Group: Individual Therapy Marlana Salvage Zaunegger Blima Rich PT, DPT 07/28/2023, 6:52 AM

## 2023-07-28 NOTE — Progress Notes (Signed)
 Occupational Therapy Session Note  Patient Details  Name: [name] MRN: [id number] Date of Birth: 1940-06-04  Today's Date: 07/28/2023 OT Individual Time: 0905-1000 OT Individual Time Calculation (min): 55 min    Short Term Goals: Week 2:  OT Short Term Goal 1 (Week 2): Pt will complete sit > stand in prep for ADL with CGA using LRAD OT Short Term Goal 2 (Week 2): Pt will complete 1/3 toileting steps with CGA for balance OT Short Term Goal 3 (Week 2): Pt will thread LB clothing with supervision using AE PRN  Skilled Therapeutic Interventions/Progress Updates:  Skilled OT intervention completed with focus on pain management, activity tolerance, cognition. Pt received seated in w/c. Upon greeting pt, pt was noted to have slurred, incomplete speech. Pt was alert and responsive, however was only able to accurately communicate her name, and unable verbalize place, time, situation. Vitals obtained: BP 138/99, HR 117 bpm, SPO2 95%.  MD and nurse present. Pt with continued slurred speech, but after time, neuro exam, pt had improvement in speech and verbalized her frustration with lack of sleep due to inability to receive pain meds when called for. Per MD- anticipate pt is frustrated and has caused dementia related word confusion. Nurse administered meds.  Pt declined self-care needs, however requested to take it easy due to severity of back pain. Transported dependently in w/c <> gym. Seated at table, pt participated in sliding pegboard design to assess sequencing and problem solving skills needed for independence with BADLs. Pt was able to complete 1 row without assist, however increased to max A for rest of task with pt frequently requiring redirection for purpose of task. Pt noticeably fatigued and easily distracted.  Back in room, pt remained seated in w/c, with chair alarm on/activated, and with all needs in reach at end of session.   Therapy Documentation Precautions:   Precautions Precautions: Fall Precaution/Restrictions Comments: ostomy, monitor O2 and HR Restrictions Weight Bearing Restrictions Per Provider Order: No    Therapy/Group: Individual Therapy  Melvyn Novas, MS, OTR/L  07/28/2023, 11:45 AM

## 2023-07-28 NOTE — Progress Notes (Addendum)
 PROGRESS NOTE   Subjective/Complaints: Upset that she asked for pain medications last night and she was not given any, discussed with Alcario Drought and she will replay to night time that prn morphine can be given   ROS: +chronic pain, +fatigue, +lower extremity edema- improved, +shortness of breath  Pt denies SOB, abd pain, CP, N/V/C/D, and vision changes   Objective:   No results found. Recent Labs    07/27/23 0611  WBC 7.5  HGB 9.4*  HCT 30.9*  PLT 198    Recent Labs    07/26/23 0759 07/27/23 0611  NA 143 142  K 3.8 3.8  CL 106 107  CO2 30 31  GLUCOSE 98 93  BUN 6* 7*  CREATININE 0.64 0.65  CALCIUM 7.9* 7.8*    Intake/Output Summary (Last 24 hours) at 07/28/2023 1338 Last data filed at 07/28/2023 1305 Gross per 24 hour  Intake 240 ml  Output 675 ml  Net -435 ml        Physical Exam: Vital Signs Blood pressure (!) 147/85, pulse (!) 109, temperature 98.1 F (36.7 C), temperature source Oral, resp. rate 17, weight 90.4 kg, SpO2 94%.   General: awake, alert, appropriate, sitting up in w/c;  NAD HENT: conjugate gaze; oropharynx moist CV: regular rate and rhythm; no JVD Pulmonary: CTA B/L; no W/R/R- good air movement GI: soft, NT, ND, (+)BS, ostomy L abdomen Psychiatric: appropriate- interactive  PRIOR EXAMS: Neurological: Ox3  Mental Status: AAOx3, memory grossly intact, fund of knowledge appropriate Speech/Languate: Follows commands CRANIAL NERVES: Cranial nerves II through XII grossly intact     MOTOR: RUE: 3/5 Deltoid, 4/5 Biceps, 4/5 Triceps,4/5 Grip LUE: 3/5 Deltoid, 4/5 Biceps, 4/5 Triceps, 4/5 Grip RLE: HF 4-/5, KE 4/5, ADF 4/5, APF 4/5 LLE: HF 4-/5, KE 4/5, ADF 4/5, APF 4/5   SENSORY: Normal to touch all 4 extremities   Coordination: Normal finger to nose, no tremor, no dysmetria, stable 3/11  Assessment/Plan: 1. Functional deficits which require 3+ hours per day of interdisciplinary  therapy in a comprehensive inpatient rehab setting. Physiatrist is providing close team supervision and 24 hour management of active medical problems listed below. Physiatrist and rehab team continue to assess barriers to discharge/monitor patient progress toward functional and medical goals  Care Tool:  Bathing    Body parts bathed by patient: Left arm, Chest, Abdomen, Face   Body parts bathed by helper: Right arm, Front perineal area, Buttocks, Right upper leg, Left upper leg, Right lower leg, Left lower leg     Bathing assist Assist Level: Maximal Assistance - Patient 24 - 49%     Upper Body Dressing/Undressing Upper body dressing   What is the patient wearing?: Pull over shirt    Upper body assist Assist Level: Moderate Assistance - Patient 50 - 74%    Lower Body Dressing/Undressing Lower body dressing      What is the patient wearing?: Incontinence brief, Pants     Lower body assist Assist for lower body dressing: Total Assistance - Patient < 25%     Toileting Toileting    Toileting assist Assist for toileting: Maximal Assistance - Patient 25 - 49%     Transfers Chair/bed  transfer  Transfers assist  Chair/bed transfer activity did not occur: Safety/medical concerns (fatigue, pain)  Chair/bed transfer assist level: Minimal Assistance - Patient > 75%     Locomotion Ambulation   Ambulation assist   Ambulation activity did not occur: Safety/medical concerns (fatigue, pain)  Assist level: Minimal Assistance - Patient > 75% Assistive device: Walker-rolling Max distance: 36ft   Walk 10 feet activity   Assist  Walk 10 feet activity did not occur: Safety/medical concerns (fatigue, pain)        Walk 50 feet activity   Assist Walk 50 feet with 2 turns activity did not occur: Safety/medical concerns (fatigue, pain)         Walk 150 feet activity   Assist Walk 150 feet activity did not occur: Safety/medical concerns (fatigue, pain)          Walk 10 feet on uneven surface  activity   Assist Walk 10 feet on uneven surfaces activity did not occur: Safety/medical concerns (fatigue, pain)         Wheelchair     Assist Is the patient using a wheelchair?: Yes Type of Wheelchair: Manual Wheelchair activity did not occur: Safety/medical concerns (fatigue, pain)         Wheelchair 50 feet with 2 turns activity    Assist    Wheelchair 50 feet with 2 turns activity did not occur: Safety/medical concerns (fatigue, pain)       Wheelchair 150 feet activity     Assist  Wheelchair 150 feet activity did not occur: Safety/medical concerns (fatigue, pain)       Blood pressure (!) 147/85, pulse (!) 109, temperature 98.1 F (36.7 C), temperature source Oral, resp. rate 17, weight 90.4 kg, SpO2 94%.  Medical Problem List and Plan: 1. Functional deficits secondary to septic shock/debility due to symptomatic chronic sigmoid colon stricture.  Status post diverting loop ostomy 2/28             -patient may shower if ostomy covered             -ELOS/Goals: 12-14, Min A to sup PT/OT             -Grounds pass ordered  Con't CIR PT and OT 2.  Antithrombotics: -DVT/anticoagulation:  Pharmaceutical: Lovenox 40mg  daily             -antiplatelet therapy: N/A  3. Pain Management/chronic pain: Neurontin 300 mg twice daily, Voltaren gel 2 g 3 times daily, Lidoderm patch as directed, MS Contin 15 mg every 12 hours, oxycodone changed to MRIR BID prn, Robaxin as needed  3/8- pain doing much better last 2 days- con't regimen 4. Mood/Behavior/Sleep: Cymbalta 60 mg daily             -antipsychotic agents: N/A 5. Neuropsych/cognition: This patient is capable of making decisions on her own behalf.  6. Skin/Wound Care: Routine skin checks  7. Fluids/Electrolytes/Nutrition: Routine in and outs with follow-up chemistries             Hypokalemia- Recheck labs  3/8- K+ up to 3.8  -07/26/23 labs today stable, monitor weekly  8.   Hypotension.  Resolved.  Midodrine discontinued.  Toprol-XL 50 mg daily.  Home Benicar/hydralazine on hold resumed as needed.   -07/26/23 BPs a bit higher today but have been sporadically normal; monitor trend but may need to restart meds Vitals:   07/25/23 0540 07/25/23 0954 07/25/23 1540 07/25/23 2045  BP: 125/74 (!) 149/92 (!) 142/92 (!) 164/87   07/25/23  2048 07/26/23 0448 07/26/23 1932 07/27/23 0420  BP: (!) 164/98 (!) 150/83 121/73 132/74   07/27/23 0900 07/27/23 1420 07/27/23 2038 07/28/23 0508  BP: 120/71 (!) 140/77 (!) 145/86 (!) 147/85     9.  Rheumatoid arthritis.  Chronic prednisone 10mg  daily resumed. Leflunomide/Plaquenil both restarted  10.  Hyperlipidemia.  Lipitor 10mg  daily  11.  Diabetes mellitus type 2 with peripheral neuropathy.  Hemoglobin A1c 5.6.  SSI.              -CBGs well controlled  3/8- Cbgs adequate control- con't regimen -07/26/23 CBGs not being done, but had good control before, SSI is also d/c'd so I'll let weekday team decide if this is needed still.   CBG (last 3)  No results for input(s): "GLUCAP" in the last 72 hours.   12.  Hypothyroidism.  Synthroid daily  13.  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  Chronic oxygen.  3 L at baseline prior to admission.  Prophylactic Zithromax resumed  14.  Chronic diastolic congestive heart failure.  Lasix 40mg  daily. Monitor for any signs of fluid overload. Daily weights  3/8- no weight today- yesterday 97.3 kg which was down  -07/26/23 wt stable/down, monitor Filed Weights   07/27/23 0500 07/27/23 0900 07/28/23 0508  Weight: 91.8 kg 91.1 kg 90.4 kg    15.  CKD stage III.  Follow-up chemistries. Last Cr 0.66 3/3  3/8- Cr 0.66   -07/26/23 Cr 0.64, go to weekly labs  16.  Iron deficiency anemia.  Follow-up CBC Monday   17.  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  Status post gamma knife 10/13.  Follow-up outpatient  18.  Morbid Obesity.  BMI 35.42.  Dietary  follow-up, d/c feeding supplement  3/8- BMI 38 19. HLD. Continue atorvastatin 10mg  daily  20. Hemorrhoid: tucks ordered, continue  21. Fatigue: B12 injection ordered 3/5    23. Chest pain: discussed that this has resolved  24. Lower extremity edema: lasix ordered, conitnue 40mg  daily  25. HTN: lasix ordered, continue 40mg  daily Weight reviewed and is decreased, continue this dose since having some shortness of breath -increase lopressor to 62.5mg  BID  26. Colostomy- has diverting loop colostomy- needs Ostomy education- placed consult  27. Tachycardia: increase lopressor to 62.5mg  BID     LOS: 8 days A FACE TO FACE EVALUATION WAS PERFORMED  Clint Bolder P Menachem Urbanek 07/28/2023, 1:38 PM

## 2023-07-28 NOTE — Progress Notes (Signed)
 Physical Therapy Weekly Progress Note  Patient Details  Name: [name] MRN: [id number] Date of Birth: [birth year]  Beginning of progress report period: July 21, 2023 End of progress report period: July 28, 2023  Patient has met 0 of 3 short term goals. Pt demonstrates slower than anticipated progress towards long term goals. Pt is currently able to transfer semi-reclined<>sitting EOB with supervision/min A with heavy reliance on bed features, and requires max A for BLE management for sit<>supine. Pt is able to perform transfers with RW and min A, but is only able to ambulate up to 73ft with RW and min A. In standing, pt demonstrates extremely flexed trunk (worsens with fatigue) with difficulty achieving trunk and hip extension. Pt is limited by pain, global weakness/deconditioning, and decreased balance strategies. Anticipate pt will require hands on 24/7 assist upon discharge. Need to schedule family education.  Patient continues to demonstrate the following deficits muscle weakness, decreased cardiorespiratoy endurance and decreased oxygen support, decreased problem solving, decreased safety awareness, and decreased memory, and decreased standing balance, decreased postural control, and decreased balance strategies and therefore will continue to benefit from skilled PT intervention to increase functional independence with mobility.  Patient progressing toward long term goals..  Continue plan of care.  PT Short Term Goals Week 1:  PT Short Term Goal 1 (Week 1): pt will perform bed mobility with min A consistantly PT Short Term Goal 1 - Progress (Week 1): Progressing toward goal PT Short Term Goal 2 (Week 1): pt will transfer bed<>chair with LRAD and CGA PT Short Term Goal 2 - Progress (Week 1): Progressing toward goal PT Short Term Goal 3 (Week 1): pt will ambulate 69ft with LRAD and CGA PT Short Term Goal 3 - Progress (Week 1): Progressing toward goal Week 2:  PT Short Term Goal 1  (Week 2): STG=LTG due to LOS  Skilled Therapeutic Interventions/Progress Updates:  Ambulation/gait training;Discharge planning;Functional mobility training;Psychosocial support;Therapeutic Activities;Balance/vestibular training;Disease management/prevention;Neuromuscular re-education;Skin care/wound management;Therapeutic Exercise;Wheelchair propulsion/positioning;Cognitive remediation/compensation;DME/adaptive equipment instruction;Pain management;Splinting/orthotics;UE/LE Strength taining/ROM;Community reintegration;Functional electrical stimulation;Patient/family education;Stair training;UE/LE Coordination activities   Therapy Documentation Precautions:  Precautions Precautions: Fall Precaution/Restrictions Comments: ostomy, monitor O2 and HR Restrictions Weight Bearing Restrictions Per Provider Order: No  Therapy/Group: Individual Therapy Marlana Salvage Zaunegger Blima Rich PT, DPT 07/28/2023, 7:16 AM

## 2023-07-28 NOTE — Progress Notes (Signed)
 Physical Therapy Session Note  Patient Details  Name: [name] MRN: [id number] Date of Birth: [birth year]  Today's Date: 07/28/2023 PT Individual Time: 0807-0850 PT Individual Time Calculation (min): 43 min   Short Term Goals: Week 1:  PT Short Term Goal 1 (Week 1): pt will perform bed mobility with min A consistantly PT Short Term Goal 1 - Progress (Week 1): Progressing toward goal PT Short Term Goal 2 (Week 1): pt will transfer bed<>chair with LRAD and CGA PT Short Term Goal 2 - Progress (Week 1): Progressing toward goal PT Short Term Goal 3 (Week 1): pt will ambulate 55ft with LRAD and CGA PT Short Term Goal 3 - Progress (Week 1): Progressing toward goal Week 2:  PT Short Term Goal 1 (Week 2): STG=LTG due to LOS  Skilled Therapeutic Interventions/Progress Updates:  Patient supine in bed on entrance to room. Patient alert and agreeable to PT session.   Patient with no pain complaint at start of session. Reports intermittent back pain and educated pt on importance of better positioning in bed as pt currently positioned with low back into flexion. Encouraged pt to spend more time OOB.   Therapeutic Activity: Bed Mobility: Pt required encouragement to initiate bringing BLE out of side of bed. Is able to perform with light MinA. Then with vc she is able to prop to R elbow and initiate push up to seated position but lacks trunk strength to complete requiring Min/ ModA to complete. Is able to perform forward scoot to EOB requiring multiple attempts. Clothing picked out by pt and initiated donning of pants, socks, and shoes all requiring MaxA. Is able to doff gown with supervision and requires MinA to don t-shirt. Rises to stand to RW to complete donning pants.   Guided in NMR for coordinating correct positioning for rise to stand and push with BUE to complete. Is able to complete with light CGA to RW x3 from elevated bed surface.   Requires seated rest break prior to stand pivot to w/c  requiring minA and Mod A to control descent to sit. Reminded pt to move with control in rise to stand as well as to bring hands back to w/c armrests to assist with controlling descent to sit in order to prevent further injury.   Stood at sink to perform washing of hands and face as well as to brush teeth. Is unable to maintain standing throughout and requires at least one hand hold to sink throughout. Sits to brush teeth.   Patient seated upright at end of session with brakes locked, belt alarm set, and all needs within reach. RN notified to pt's need of morning meds including pain medication.   Therapy Documentation Precautions:  Precautions Precautions: Fall Precaution/Restrictions Comments: ostomy, monitor O2 and HR Restrictions Weight Bearing Restrictions Per Provider Order: No  Pain: Pain increased with time in standing d/t chronic LBP as well as poor positioning in bed. Requested pain medication from RN and addressed with mobility.   Therapy/Group: Individual Therapy  Loel Dubonnet PT, DPT, CSRS 07/28/2023, 4:45 PM

## 2023-07-28 NOTE — Progress Notes (Signed)
 Physical Therapy Session Note  Patient Details  Name: [name] MRN: [id number] Date of Birth: 07-05-1940  Today's Date: 07/28/2023 PT Individual Time: 1304-1330 PT Individual Time Calculation (min): 26 min   Short Term Goals: Week 1:  PT Short Term Goal 1 (Week 1): pt will perform bed mobility with min A consistantly PT Short Term Goal 1 - Progress (Week 1): Progressing toward goal PT Short Term Goal 2 (Week 1): pt will transfer bed<>chair with LRAD and CGA PT Short Term Goal 2 - Progress (Week 1): Progressing toward goal PT Short Term Goal 3 (Week 1): pt will ambulate 40ft with LRAD and CGA PT Short Term Goal 3 - Progress (Week 1): Progressing toward goal  Skilled Therapeutic Interventions/Progress Updates:    pt received in bed and agreeable to therapy. Pt reports pain "better than it was." Recd lidocaine patch to R flank from nsg during session.   Pt requesting to use bathroom. Min a bed mobility for BLE and trunk elevation. Stedy transfer to toilet with mod a. Pt found to be incontinent of bladder with fully soaked brief, followed by continent bladder void. Documented in flow sheets. Tot a for pericare and brief/clothing change. Pt performed Sit to stand with min-mod a in stedy throughout.   Pt then participated in 2 x 5 Sit to stand in stedy for BLE strength and endurance, fluctuating from min-mod a. Pt remained in w/c at end of session, was left with all needs in reach and alarm active.   Therapy Documentation Precautions:  Precautions Precautions: Fall Precaution/Restrictions Comments: ostomy, monitor O2 and HR Restrictions Weight Bearing Restrictions Per Provider Order: No General:       Therapy/Group: Individual Therapy  Juluis Rainier 07/28/2023, 1:27 PM

## 2023-07-28 NOTE — Plan of Care (Signed)
  Problem: Consults Goal: RH GENERAL PATIENT EDUCATION Description: See Patient Education module for education specifics. Outcome: Progressing   Problem: RH BOWEL ELIMINATION Goal: RH STG MANAGE BOWEL WITH ASSISTANCE Description: STG Manage Bowel with min Assistance. Outcome: Progressing   Problem: RH BLADDER ELIMINATION Goal: RH STG MANAGE BLADDER WITH ASSISTANCE Description: STG Manage Bladder With toileting Assistance Outcome: Progressing   Problem: RH SKIN INTEGRITY Goal: RH STG ABLE TO PERFORM INCISION/WOUND CARE W/ASSISTANCE Description: STG Able To Perform Incision/Wound Care With min Assistance. Outcome: Progressing   Problem: RH SAFETY Goal: RH STG ADHERE TO SAFETY PRECAUTIONS W/ASSISTANCE/DEVICE Description: STG Adhere to Safety Precautions With cues Assistance/Device. Outcome: Progressing   Problem: Bowel/Gastric/Urinary: Goal: Gastrointestinal status for postoperative course will improve Description: Patient and spouse will be able to manage care at discharge using educational resources for medications and skin care and dietary modification independently Outcome: Progressing   Problem: Coping: Goal: Coping ability will improve Description: Managing anxiety with cymbalta Outcome: Progressing   Problem: Fluid Volume: Goal: Ability to achieve a balanced intake and output will improve Outcome: Progressing

## 2023-07-28 NOTE — Plan of Care (Signed)
  Problem: Consults Goal: RH GENERAL PATIENT EDUCATION Description: See Patient Education module for education specifics. Outcome: Progressing   Problem: RH BOWEL ELIMINATION Goal: RH STG MANAGE BOWEL WITH ASSISTANCE Description: STG Manage Bowel with min Assistance. Outcome: Progressing   Problem: RH BLADDER ELIMINATION Goal: RH STG MANAGE BLADDER WITH ASSISTANCE Description: STG Manage Bladder With toileting Assistance Outcome: Progressing   Problem: RH SKIN INTEGRITY Goal: RH STG ABLE TO PERFORM INCISION/WOUND CARE W/ASSISTANCE Description: STG Able To Perform Incision/Wound Care With min Assistance. Outcome: Progressing   Problem: RH SAFETY Goal: RH STG ADHERE TO SAFETY PRECAUTIONS W/ASSISTANCE/DEVICE Description: STG Adhere to Safety Precautions With cues Assistance/Device. Outcome: Progressing   Problem: RH PAIN MANAGEMENT Goal: RH STG PAIN MANAGED AT OR BELOW PT'S PAIN GOAL Description: < 4 with prns Outcome: Progressing   Problem: RH KNOWLEDGE DEFICIT GENERAL Goal: RH STG INCREASE KNOWLEDGE OF SELF CARE AFTER HOSPITALIZATION Description: Patient and spouse will be able to manage care at discharge using educational resources for medications and skin care and dietary modification independently Outcome: Progressing   Problem: Education: Goal: Knowledge of ostomy care will improve Description: Patient and spouse will be able to manage care at discharge using educational resources for medications and skin care and dietary modification independently Outcome: Progressing Goal: Understanding of discharge needs will improve Description: Patient and spouse will be able to manage care at discharge using educational resources for medications and skin care and dietary modification independently Outcome: Progressing   Problem: Bowel/Gastric/Urinary: Goal: Gastrointestinal status for postoperative course will improve Description: Patient and spouse will be able to manage care at  discharge using educational resources for medications and skin care and dietary modification independently Outcome: Progressing   Problem: Coping: Goal: Coping ability will improve Description: Managing anxiety with cymbalta Outcome: Progressing   Problem: Fluid Volume: Goal: Ability to achieve a balanced intake and output will improve Outcome: Progressing   Problem: Health Behavior/Discharge Planning: Goal: Ability to manage health-related needs will improve Outcome: Progressing   Problem: Nutrition: Goal: Will attain and maintain optimal nutritional status will improve Outcome: Progressing

## 2023-07-29 DIAGNOSIS — R5381 Other malaise: Secondary | ICD-10-CM | POA: Diagnosis not present

## 2023-07-29 LAB — MAGNESIUM: Magnesium: 1.6 mg/dL — ABNORMAL LOW (ref 1.7–2.4)

## 2023-07-29 MED ORDER — FUROSEMIDE 20 MG PO TABS
60.0000 mg | ORAL_TABLET | Freq: Every day | ORAL | Status: DC
  Administered 2023-07-30: 60 mg via ORAL
  Filled 2023-07-29: qty 1

## 2023-07-29 NOTE — Progress Notes (Signed)
 Physical Therapy Session Note  Patient Details  Name: [name] MRN: [id number] Date of Birth: 1941-03-17  Today's Date: 07/29/2023 PT Individual Time: 0807-0904 PT Individual Time Calculation (min): 57 min   Short Term Goals: Week 2:  PT Short Term Goal 1 (Week 2): STG=LTG due to LOS  Skilled Therapeutic Interventions/Progress Updates: Patient supine in bed with nsg present on entrance to room. Patient alert and agreeable to PT session.   Patient reported 4/10 low back pain (chronic) with nsg providing pain medication. Pt stated pain increases when bending forward (specifically during sit to stands). PTA building pt rapport at beginning of session with pt reporting biggest personal goal is to walk again. Pt provided history of debility, and expressed somber mood regarding CLOF as this was something pt had to previously go through years ago. PTA provided active listening and encouragement. Pt reported that R hip pain is located around lateral aspect of hip/glutes. PTA palpated area and noted to locate trigger points that caused R LE to twitch. Pt performed supine<R and L sidelying x 2 while PTA assisted pt in donning personal pants with VC for sequence. Pt supine<sit EOB with maxA and VC to utilize B UE to assist with truncal elevation accordingly. Pt stood from EOB x 3 to RW to attempt to increase standing tolerance, and to transfer to [facility] but required seated rest due to reports of low back pain. Pt provided with cues to engage core to promote neutral spine with education and demonstration provided. Pt reported slight improvement in decreased pain, but pain still present. Pt required minA to stand (bed slightly elevated to avoid agitating pt's low back pain). Pt stand pivot transfer without RW and with PTA in front with mod/heavy modA and VC for pt to advance B LE's during pivot, while reaching back to WC arm rest to control descent. Pt required maxA to posteriorly scoot in WC. B LE's off of  floor after posteriorly scooting (not all the way back in seat). PTA provided heavy duty leg rests so that pt can utilize LE to assist in posteriorly scooting (still maxA with pt ultimately requiring maxA to scoot one hip posteriorly at a time).  Manual Therapy: Palpation of R glute performed with trigger points noted. Education and rationale provided with pt agreeing to participate in intervention. - Trigger point release to stated (including STM to glute medius attachment at greater trochanter - pt reported some tenderness/soreness)  with soft tissue mobilization to follow throughout. Increased time required to adjust to pt's tolerance to trigger point release.  Patient sitting in WC at end of session with brakes locked, belt alarm set, and all needs within reach.      Therapy Documentation Precautions:  Precautions Precautions: Fall Precaution/Restrictions Comments: ostomy, monitor O2 and HR Restrictions Weight Bearing Restrictions Per Provider Order: No  Therapy/Group: Individual Therapy  Johnanthony Wilden PTA 07/29/2023, 11:13 AM

## 2023-07-29 NOTE — Progress Notes (Addendum)
 PROGRESS NOTE   Subjective/Complaints: In a much better mood today and speech is also better Appreciate SW discussing with patient plan for SNF   ROS: +chronic pain- better controlled today, +fatigue, +lower extremity edema- improved, +shortness of breath  Pt denies SOB, abd pain, CP, N/V/C/D, and vision changes   Objective:   No results found. Recent Labs    07/27/23 0611  WBC 7.5  HGB 9.4*  HCT 30.9*  PLT 198    Recent Labs    07/27/23 0611  NA 142  K 3.8  CL 107  CO2 31  GLUCOSE 93  BUN 7*  CREATININE 0.65  CALCIUM 7.8*    Intake/Output Summary (Last 24 hours) at 07/29/2023 1536 Last data filed at 07/29/2023 1252 Gross per 24 hour  Intake 420 ml  Output --  Net 420 ml        Physical Exam: Vital Signs Blood pressure (!) 139/92, pulse 98, temperature 98.7 F (37.1 C), resp. rate 18, weight 98.2 kg, SpO2 97%.   General: awake, alert, appropriate, sitting up in w/c;  NAD HENT: conjugate gaze; oropharynx moist CV: regular rate and rhythm; no JVD Pulmonary: CTA B/L; no W/R/R- good air movement GI: soft, NT, ND, (+)BS, ostomy L abdomen Psychiatric: appropriate- interactive  PRIOR EXAMS: Neurological: Ox3  Mental Status: AAOx3, memory grossly intact, fund of knowledge appropriate Speech/Languate: Follows commands CRANIAL NERVES: Cranial nerves II through XII grossly intact     MOTOR: RUE: 3/5 Deltoid, 4/5 Biceps, 4/5 Triceps,4/5 Grip LUE: 3/5 Deltoid, 4/5 Biceps, 4/5 Triceps, 4/5 Grip RLE: HF 4-/5, KE 4/5, ADF 4/5, APF 4/5 LLE: HF 4-/5, KE 4/5, ADF 4/5, APF 4/5   SENSORY: Normal to touch all 4 extremities   Coordination: Normal finger to nose, no tremor, no dysmetria, stable 3/12  Assessment/Plan: 1. Functional deficits which require 3+ hours per day of interdisciplinary therapy in a comprehensive inpatient rehab setting. Physiatrist is providing close team supervision and 24 hour  management of active medical problems listed below. Physiatrist and rehab team continue to assess barriers to discharge/monitor patient progress toward functional and medical goals  Care Tool:  Bathing    Body parts bathed by patient: Left arm, Chest, Abdomen, Face   Body parts bathed by helper: Right arm, Front perineal area, Buttocks, Right upper leg, Left upper leg, Right lower leg, Left lower leg     Bathing assist Assist Level: Maximal Assistance - Patient 24 - 49%     Upper Body Dressing/Undressing Upper body dressing   What is the patient wearing?: Pull over shirt    Upper body assist Assist Level: Moderate Assistance - Patient 50 - 74%    Lower Body Dressing/Undressing Lower body dressing      What is the patient wearing?: Incontinence brief, Pants     Lower body assist Assist for lower body dressing: Total Assistance - Patient < 25%     Toileting Toileting    Toileting assist Assist for toileting: Maximal Assistance - Patient 25 - 49%     Transfers Chair/bed transfer  Transfers assist  Chair/bed transfer activity did not occur: Safety/medical concerns (fatigue, pain)  Chair/bed transfer assist level: Minimal Assistance - Patient >  75%     Locomotion Ambulation   Ambulation assist   Ambulation activity did not occur: Safety/medical concerns (fatigue, pain)  Assist level: Minimal Assistance - Patient > 75% Assistive device: Walker-rolling Max distance: 64ft   Walk 10 feet activity   Assist  Walk 10 feet activity did not occur: Safety/medical concerns (fatigue, pain)        Walk 50 feet activity   Assist Walk 50 feet with 2 turns activity did not occur: Safety/medical concerns (fatigue, pain)         Walk 150 feet activity   Assist Walk 150 feet activity did not occur: Safety/medical concerns (fatigue, pain)         Walk 10 feet on uneven surface  activity   Assist Walk 10 feet on uneven surfaces activity did not occur:  Safety/medical concerns (fatigue, pain)         Wheelchair     Assist Is the patient using a wheelchair?: Yes Type of Wheelchair: Manual Wheelchair activity did not occur: Safety/medical concerns (fatigue, pain)         Wheelchair 50 feet with 2 turns activity    Assist    Wheelchair 50 feet with 2 turns activity did not occur: Safety/medical concerns (fatigue, pain)       Wheelchair 150 feet activity     Assist  Wheelchair 150 feet activity did not occur: Safety/medical concerns (fatigue, pain)       Blood pressure (!) 139/92, pulse 98, temperature 98.7 F (37.1 C), resp. rate 18, weight 98.2 kg, SpO2 97%.  Medical Problem List and Plan: 1. Functional deficits secondary to septic shock/debility due to symptomatic chronic sigmoid colon stricture.  Status post diverting loop ostomy 2/28             -patient may shower if ostomy covered             -ELOS/Goals: 12-14, Min A to sup PT/OT             -Grounds pass ordered  Con't CIR PT and OT 2.  Antithrombotics: -DVT/anticoagulation:  Pharmaceutical: Lovenox 40mg  daily             -antiplatelet therapy: N/A  3. Pain Management/chronic pain: Neurontin 300 mg twice daily, Voltaren gel 2 g 3 times daily, Lidoderm patch as directed, MS Contin 15 mg every 12 hours, oxycodone changed to MRIR BID prn, Robaxin as needed  3/8- pain doing much better last 2 days- con't regimen 4. Mood/Behavior/Sleep: Cymbalta 60 mg daily             -antipsychotic agents: N/A 5. Neuropsych/cognition: This patient is capable of making decisions on her own behalf.  6. Skin/Wound Care: Routine skin checks  7. Fluids/Electrolytes/Nutrition: Routine in and outs with follow-up chemistries             Hypokalemia- Recheck labs  3/8- K+ up to 3.8  -07/26/23 labs today stable, monitor weekly  8.  Hypotension.  Resolved.  Midodrine discontinued.  Toprol-XL 50 mg daily.  Home Benicar/hydralazine on hold resumed as needed.   -07/26/23 BPs a bit  higher today but have been sporadically normal; monitor trend but may need to restart meds Vitals:   07/26/23 1932 07/27/23 0420 07/27/23 0900 07/27/23 1420  BP: 121/73 132/74 120/71 (!) 140/77   07/27/23 2038 07/28/23 0508 07/28/23 1552 07/28/23 1928  BP: (!) 145/86 (!) 147/85 123/84 132/75   07/28/23 2349 07/28/23 2357 07/29/23 0455 07/29/23 1500  BP: (!) 152/100 (!) 174/103 [name])  146/80 (!) 139/92     9.  Rheumatoid arthritis.  Chronic prednisone 10mg  daily resumed. Leflunomide/Plaquenil both restarted  10.  Hyperlipidemia.  Lipitor 10mg  daily  11.  Diabetes mellitus type 2 with peripheral neuropathy.  Hemoglobin A1c 5.6.  SSI.              -CBGs well controlled  3/8- Cbgs adequate control- con't regimen -07/26/23 CBGs not being done, but had good control before, SSI is also d/c'd so I'll let weekday team decide if this is needed still.   CBG (last 3)  No results for input(s): "GLUCAP" in the last 72 hours.   12.  Hypothyroidism.  Synthroid daily  13.  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  Chronic oxygen.  3 L at baseline prior to admission.  Prophylactic Zithromax resumed  14.  Chronic diastolic congestive heart failure.  Lasix 40mg  daily. Monitor for any signs of fluid overload. Daily weights  3/8- no weight today- yesterday 97.3 kg which was down  -07/26/23 wt stable/down, monitor Filed Weights   07/28/23 0508 07/28/23 1848 07/29/23 0500  Weight: 90.4 kg 90 kg 98.2 kg    15.  CKD stage III.  Follow-up chemistries. Last Cr 0.66 3/3  3/8- Cr 0.66   -07/26/23 Cr 0.64, go to weekly labs  16.  Iron deficiency anemia.  Follow-up CBC Monday   17.  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  Status post gamma knife 10/13.  Follow-up outpatient  18.  Morbid Obesity.  BMI 35.42.  Dietary follow-up, d/c feeding supplement  3/8- BMI 38 19. HLD. Continue atorvastatin 10mg  daily  20. Hemorrhoid: tucks ordered, continue  21. Fatigue: B12  injection ordered 3/5    23. Chest pain: discussed that this has resolved  24. Lower extremity edema: weight increased, increase lasix to 60mg  for tomorrow  25. HTN: lasix ordered, continue 40mg  daily Weight reviewed and is decreased, continue this dose since having some shortness of breath -increase lopressor to 62.5mg  BID, magnesium level ordered 3/12  26. Colostomy- has diverting loop colostomy- continue colostomy education  27. Tachycardia: increase lopressor to 62.5mg  BID, magnesium level ordered 3/12     LOS: 9 days A FACE TO FACE EVALUATION WAS PERFORMED  Drema Pry Regis Wiland 07/29/2023, 3:36 PM

## 2023-07-29 NOTE — Progress Notes (Signed)
 Occupational Therapy Session Note  Patient Details  Name: [name] MRN: [id number] Date of Birth: 1940-11-19  Today's Date: 07/29/2023 OT Individual Time: 1320-1415 OT Individual Time Calculation (min): 55 min    Short Term Goals: Week 2:  OT Short Term Goal 1 (Week 2): Pt will complete sit > stand in prep for ADL with CGA using LRAD OT Short Term Goal 2 (Week 2): Pt will complete 1/3 toileting steps with CGA for balance OT Short Term Goal 3 (Week 2): Pt will thread LB clothing with supervision using AE PRN  Skilled Therapeutic Interventions/Progress Updates:  Skilled OT intervention completed with focus on cognition, sit > stands. Pt received seated in w/c, agreeable to session. Lower back pain reported; pre-medicated. OT offered rest breaks and repositioning throughout for pain reduction. Pt remained on RA with SPO2 WNL.  Following care team conference, pt's team discussed pt's regression in cognitive status with request for OT to re-complete the BIMS assessment. Pt originally scored 11/15 1 week ago, and as of date score 8/15.  Pt participated in the following activities in sitting to address BUE AROM and cognitive strategies needed for independence and safety with BADL management: -Motor speed dot test- 3.48 sec, no difficulty, however results indicative of slower motor processing speed -Bell cancellation test- 5 min; 6 misses, with greater than 10 errors. Pt with poor recall of activity instructions and forgot midway during task  Pt became very defensive about her cognitive skills when OT inquired if she felt like memory or day to day cognitive tasks were more challenging for her, stating "I just don't like doing activities that don't interest me." Pt demonstrated significantly impaired cognition and poor insight to deficits.  Transitioned to blocked practice sit <> stands. Pt required multimodal cueing, including hand over hand guidance for hand placement with RW, then able to  stand x5 with min A using mirror for visual feedback. Required mod A to boost hips back into w/c.  Back in room, pt remained seated in w/c, with belt alarm on/activated, and with all needs in reach at end of session.   Therapy Documentation Precautions:  Precautions Precautions: Fall Precaution/Restrictions Comments: ostomy, monitor O2 and HR Restrictions Weight Bearing Restrictions Per Provider Order: No    Therapy/Group: Individual Therapy  Melvyn Novas, MS, OTR/L  07/29/2023, 2:32 PM

## 2023-07-29 NOTE — [place] FL2 (Signed)
 [place] MEDICAID FL2 LEVEL OF CARE FORM     IDENTIFICATION  Patient Name: [name] Birthdate: [birth year] Sex: female Admission Date (Current Location): 07/20/2023  [facility] and [place] Number:  Producer, television/film/video and Address:  The . [facility], [address], [place], [place] [address]      Provider Number: [id number]  Attending Physician Name and Address:  Horton Chin, MD  Relative Name and Phone Number:  Jerl Santos #[phone]    Current Level of Care: Hospital Recommended Level of Care: Skilled Nursing Facility Prior Approval Number:    Date Approved/Denied:   PASRR Number: [id number]  Discharge Plan: SNF    Current Diagnoses: Patient Active Problem List   Diagnosis Date Noted   Anxiety state 07/24/2023   Septic shock (HCC) 07/20/2023   Type 2 diabetes mellitus with diabetic polyneuropathy, without long-term current use of insulin (HCC) 07/20/2023   Iron deficiency anemia 07/20/2023   Hyperlipidemia 07/20/2023   Chronic diastolic congestive heart failure (HCC) 07/20/2023   Abdominal pain 07/16/2023   GI bleed 07/15/2023   Enterocolitis 06/30/2023   Sepsis due to undetermined organism (HCC) 06/30/2023   Hypothyroidism 06/30/2023   GERD without esophagitis 06/30/2023   Peripheral neuropathy 06/30/2023   RSV (respiratory syncytial virus pneumonia) 06/26/2023   Ileus (HCC) 06/23/2023   Lower GI bleed 12/16/2022   BRBPR (bright red blood per rectum) 12/15/2022   [name] 09/08/2022   Abnormal CT scan, sigmoid colon 04/14/2022   Colon stricture (HCC) 04/14/2022   Diverticulosis of colon without hemorrhage 04/14/2022   History of pulmonary embolus (PE) 11/15/2021   Tachycardia 10/22/2021   Pulmonary HTN (HCC)    Unspecified protein-calorie malnutrition (HCC) 04/05/2021   Pressure injury of skin 03/31/2021   Thrombocytopenia (HCC) 03/30/2021   Diabetic neuropathy (HCC) 05/24/2019    Hypokalemia 04/21/2019   Anticoagulated 06/25/2018   Chronic pain 06/25/2018   CRI (chronic renal insufficiency), stage 3 (moderate) (HCC) 06/25/2018   Drug-induced constipation 06/18/2018   DVT (deep venous thrombosis) (HCC) 06/03/2018   Primary osteoarthritis of both knees 05/26/2018   Body mass index (BMI) of 50-59.9 in adult (HCC) 05/26/2018   Acquired hypothyroidism 05/26/2018   Chronic bilateral low back pain without sciatica 02/17/2018   Astigmatism with presbyopia, bilateral 03/26/2017   Cortical age-related cataract of both eyes 03/26/2017   Family history of glaucoma 03/26/2017   Long term current use of oral hypoglycemic drug 03/26/2017   Nuclear sclerotic cataract of both eyes 03/26/2017   Pain in left hip 02/18/2017   Gout 11/29/2016   Depression 11/29/2016   Physical deconditioning 07/31/2015   Pulmonary fibrosis, postinflammatory (HCC) 07/31/2015   Bronchiectasis without complication (HCC) 07/31/2015   Tendinitis of left rotator cuff 06/13/2015   Chronic heart failure with preserved ejection fraction (HFpEF) (HCC) 04/21/2015   Chronic respiratory failure with hypoxia (HCC) 02/09/2015   Dyspnea and respiratory abnormality 01/03/2015   Normal coronary arteries 11/02/2014   Morbid obesity (HCC) 10/30/2014   Dysphagia 08/21/2014   Debility 06/21/2014   DOE (dyspnea on exertion) 05/24/2014   Primary osteoarthritis of left knee 04/26/2014   High risk medication use 04/17/2014   Aphasia 02/12/2014   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) 01/18/2014   Lumbar facet arthropathy 01/18/2014   Bilateral edema of lower extremity 05/30/2013   Chronic cough 05/24/2013   ILD (interstitial lung disease) (HCC) 04/10/2013   Spinal stenosis of lumbar region with radiculopathy 07/12/2012   Inability  to walk 07/12/2012   Meningioma of left sphenoid wing involving cavernous sinus (HCC) 02/17/2012   Rheumatoid arthritis (HCC) 01/28/2012   Primary osteoarthritis of  right knee 01/28/2012   Dyslipidemia    Essential hypertension     Orientation RESPIRATION BLADDER Height & Weight     Self, Time, Situation, Place  Normal Continent (timed tolieting) Weight: 216 lb 7.9 oz (98.2 kg) Height:     BEHAVIORAL SYMPTOMS/MOOD NEUROLOGICAL BOWEL NUTRITION STATUS      Colostomy Diet  AMBULATORY STATUS COMMUNICATION OF NEEDS Skin   Limited Assist Verbally Normal                       Personal Care Assistance Level of Assistance  Bathing, Feeding, Dressing Bathing Assistance: Limited assistance Feeding assistance: Limited assistance Dressing Assistance: Limited assistance     Functional Limitations Info  Sight, Hearing, Speech Sight Info: Adequate Hearing Info: Adequate Speech Info: Adequate    SPECIAL CARE FACTORS FREQUENCY  PT (By licensed PT), OT (By licensed OT), Speech therapy     PT Frequency: 5xs per week OT Frequency: 5xs per week            Contractures Contractures Info: Not present    Additional Factors Info  Code Status, Allergies Code Status Info: Full Allergies Info: See discharge instructions           Current Medications (07/29/2023):  This is the current hospital active medication list Current Facility-Administered Medications  Medication Dose Route Frequency Provider Last Rate Last Admin   acetaminophen (TYLENOL) tablet 1,000 mg  1,000 mg Oral TID Raulkar, Drema Pry, MD   1,000 mg at 07/29/23 0757   Or   acetaminophen (TYLENOL) suppository 650 mg  650 mg Rectal TID Raulkar, Drema Pry, MD       albuterol (PROVENTIL) (2.5 MG/3ML) 0.083% nebulizer solution 3 mL  3 mL Inhalation Q2H PRN Angiulli, [name], PA-C       atorvastatin (LIPITOR) tablet 10 mg  10 mg Oral Daily Charlton Amor, PA-C   10 mg at 07/29/23 0758   azithromycin (ZITHROMAX) tablet 250 mg  250 mg Oral Q M,W,F-2000 AngiulliMcarthur Rossetti, PA-C   250 mg at 07/27/23 2128   diclofenac Sodium (VOLTAREN) 1 % topical gel 2 g  2 g Topical TID Charlton Amor, PA-C   2 g at 07/29/23 1314   DULoxetine (CYMBALTA) DR capsule 60 mg  60 mg Oral Daily Charlton Amor, PA-C   60 mg at 07/29/23 0757   enoxaparin (LOVENOX) injection 40 mg  40 mg Subcutaneous Q24H Charlton Amor, PA-C   40 mg at 07/29/23 0757   [START ON 07/30/2023] furosemide (LASIX) tablet 60 mg  60 mg Oral Daily Raulkar, Drema Pry, MD       gabapentin (NEURONTIN) capsule 300 mg  300 mg Oral BID Charlton Amor, PA-C   300 mg at 07/29/23 0757   guaiFENesin (MUCINEX) 12 hr tablet 1,200 mg  1,200 mg Oral BID Charlton Amor, PA-C   1,200 mg at 07/29/23 2536   hydrALAZINE (APRESOLINE) tablet 10 mg  10 mg Oral Q6H PRN Jacquelynn Cree, PA-C   10 mg at 07/28/23 2358   hydrocerin (EUCERIN) cream   Topical BID Horton Chin, MD   Given at 07/29/23 0756   hydroxychloroquine (PLAQUENIL) tablet 200 mg  200 mg Oral Daily Horton Chin, MD   200 mg at 07/29/23 0807   leflunomide (ARAVA) tablet  20 mg  20 mg Oral Daily Raulkar, Drema Pry, MD   20 mg at 07/29/23 0807   levothyroxine (SYNTHROID) tablet 50 mcg  50 mcg Oral Daily Charlton Amor, PA-C   50 mcg at 07/29/23 0603   lidocaine (LIDODERM) 5 % 1 patch  1 patch Transdermal Q24H Charlton Amor, PA-C   1 patch at 07/29/23 0758   lidocaine (LIDODERM) 5 % 1 patch  1 patch Transdermal Q24H Horton Chin, MD   1 patch at 07/29/23 1541   magnesium gluconate (MAGONATE) tablet 250 mg  250 mg Oral QHS Raulkar, Drema Pry, MD   250 mg at 07/28/23 2354   methocarbamol (ROBAXIN) tablet 500 mg  500 mg Oral Q6H PRN Charlton Amor, PA-C   500 mg at 07/29/23 0757   metoprolol tartrate (LOPRESSOR) tablet 62.5 mg  62.5 mg Oral BID Horton Chin, MD   62.5 mg at 07/29/23 0757   morphine (MS CONTIN) 12 hr tablet 15 mg  15 mg Oral Q12H AngiulliMcarthur Rossetti, PA-C   15 mg at 07/29/23 0603   morphine (MSIR) tablet 15 mg  15 mg Oral BID PRN Horton Chin, MD   15 mg at 07/29/23 1314   nitroGLYCERIN (NITROSTAT) SL tablet 0.4 mg   0.4 mg Sublingual Q5 min PRN Raulkar, Drema Pry, MD   0.4 mg at 07/23/23 1102   pantoprazole (PROTONIX) EC tablet 40 mg  40 mg Oral BID Charlton Amor, PA-C   40 mg at 07/29/23 0757   potassium chloride SA (KLOR-CON M) CR tablet 40 mEq  40 mEq Oral BID Jacquelynn Cree, PA-C   40 mEq at 07/29/23 0757   predniSONE (DELTASONE) tablet 10 mg  10 mg Oral Q breakfast Charlton Amor, PA-C   10 mg at 07/29/23 0981   spironolactone (ALDACTONE) tablet 12.5 mg  12.5 mg Oral Daily Corrin Parker, PA-C   12.5 mg at 07/29/23 1914   witch hazel-glycerin (TUCKS) pad   Topical PRN Charlton Amor, PA-C         Discharge Medications: Please see discharge summary for a list of discharge medications.  Relevant Imaging Results:  Relevant Lab Results:   Additional Information [id number]  Gretchen Short, LCSW

## 2023-07-29 NOTE — Progress Notes (Addendum)
 Physical Therapy Session Note  Patient Details  Name: [name] MRN: [id number] Date of Birth: 1941/02/25  Today's Date: 07/29/2023 PT Individual Time: 3474-2595 PT Individual Time Calculation (min): 56 min  Today's Date: 07/29/2023 PT Missed Time: 19 Minutes Missed Time Reason: Other (Comment) (ostomy care)  Short Term Goals: Week 1:  PT Short Term Goal 1 (Week 1): pt will perform bed mobility with min A consistantly PT Short Term Goal 1 - Progress (Week 1): Progressing toward goal PT Short Term Goal 2 (Week 1): pt will transfer bed<>chair with LRAD and CGA PT Short Term Goal 2 - Progress (Week 1): Progressing toward goal PT Short Term Goal 3 (Week 1): pt will ambulate 23ft with LRAD and CGA PT Short Term Goal 3 - Progress (Week 1): Progressing toward goal Week 2:  PT Short Term Goal 1 (Week 2): STG=LTG due to LOS  Skilled Therapeutic Interventions/Progress Updates:   Received pt sitting in recliner, pt agreeable to PT treatment, and reported fatigue and pain in R hip/low back - Voltaren gel applied to affected area. Session with emphasis on functional mobility/transfers, generalized strengthening and endurance, dynamic standing balance/coordination, and gait training. Stood from [employer] with RW and light mod A x 2 trials, then performed stand<>pivot transfer into WC with RW and min A - cues to back up completely to [facility] prior to sitting.   Pt transported to/from room in [facility] dependently for time management and energy conservation purposes. Stood from [facility] with RW and min A and attempted to ambulate - pt only able to ambulate 52ft prior to stopping and stooping over RW due to fatigue. Pt then transferred on/off Nustep via stand<>pivot with RW and min A with cues for hand placement and backing up completely prior to sitting. Required max A for BLE management onto Nustep footplates and worked on BUE/BLE strengthening on Nustep at workload 2 for 8 minutes for a total of 345 steps with emphasis  on cardiovascular endurance. Pt took 1 rest break - HR 86bpm and SPO2 94%.  [name] arrived for ostomy care education meeting at 71. RN notfied and contacted ostomy care NP. Stood from [facility] with RW and min A and ambulated additional 81ft with RW and min A with maximal encouragement, but again limited by fatigue. Pt required cues for upright posture to get elbows off of RW handles, but pt unable to come fully upright. Ostomy NP arrived and pt transported back to room. Concluded session with pt sitting in WC, needs within reach, and seatbelt alarm on. 19 minutes missed of skilled physical therapy due to ostomy care education.   Therapy Documentation Precautions:  Precautions Precautions: Fall Precaution/Restrictions Comments: ostomy, monitor O2 and HR Restrictions Weight Bearing Restrictions Per Provider Order: No  Therapy/Group: Individual Therapy Marlana Salvage Zaunegger Blima Rich PT, DPT 07/29/2023, 6:50 AM

## 2023-07-29 NOTE — Plan of Care (Signed)
  Problem: RH Bed Mobility Goal: LTG Patient will perform bed mobility with assist (PT) Description: LTG: Patient will perform bed mobility with assistance, with/without cues (PT). Flowsheets (Taken 07/29/2023 0710) LTG: Pt will perform bed mobility with assistance level of: (downgraded due to pain and global weakness/deconditioning) Supervision/Verbal cueing Note: downgraded due to pain and global weakness/deconditioning    Problem: RH Car Transfers Goal: LTG Patient will perform car transfers with assist (PT) Description: LTG: Patient will perform car transfers with assistance (PT). Flowsheets (Taken 07/29/2023 0710) LTG: Pt will perform car transfers with assist:: (downgraded due to pain and global weakness/deconditioning) Minimal Assistance - Patient > 75% Note: downgraded due to pain and global weakness/deconditioning    Problem: RH Ambulation Goal: LTG Patient will ambulate in controlled environment (PT) Description: LTG: Patient will ambulate in a controlled environment, # of feet with assistance (PT). Flowsheets (Taken 07/29/2023 0710) LTG: Pt will ambulate in controlled environ  assist needed:: (downgraded due to pain and global weakness/deconditioning) Contact Guard/Touching assist LTG: Ambulation distance in controlled environment: 13ft with LRAD Note: downgraded due to pain and global weakness/deconditioning  Goal: LTG Patient will ambulate in home environment (PT) Description: LTG: Patient will ambulate in home environment, # of feet with assistance (PT). Flowsheets (Taken 07/29/2023 0710) LTG: Pt will ambulate in home environ  assist needed:: (downgraded due to pain and global weakness/deconditioning) Contact Guard/Touching assist LTG: Ambulation distance in home environment: 43ft with LRAD Note: downgraded due to pain and global weakness/deconditioning    Problem: RH Wheelchair Mobility Goal: LTG Patient will propel w/c in controlled environment (PT) Description: LTG: Patient  will propel wheelchair in controlled environment, # of feet with assist (PT) Flowsheets (Taken 07/29/2023 0710) LTG: Pt will propel w/c in controlled environ  assist needed:: (downgraded due to pain and global weakness/deconditioning) Supervision/Verbal cueing LTG: Propel w/c distance in controlled environment: 26ft Note: downgraded due to pain and global weakness/deconditioning  Goal: LTG Patient will propel w/c in home environment (PT) Description: LTG: Patient will propel wheelchair in home environment, # of feet with assistance (PT). Flowsheets (Taken 07/29/2023 0710) LTG: Pt will propel w/c in home environ  assist needed:: (downgraded due to pain and global weakness/deconditioning) Supervision/Verbal cueing LTG: Propel w/c distance in home environment: 26ft Note: downgraded due to pain and global weakness/deconditioning

## 2023-07-29 NOTE — Progress Notes (Signed)
 Patient ID: ELVENIA GODDEN, female   DOB: 05-10-1941, 83 y.o.   MRN: [id number]   1228-SW spoke with pt son Lyda Jester to provide updates from team conference,and SN recommendation based on limited gains made and continued physical support needed. He is aware SW will discuss with his mother. Family edu in place for Friday 1pm-4pm.   *SW met with pt in room and had extensive conversation about updates from team conference, and d/c recs for SNF. Pt is tearful, however, understands, and in agreement with placement. Told her we will cancel family edu since she will go to placement, and will bring her  a SNF list for the family to review. She asked SW to send out a referral.   1444-SW spoke with pt son Lyda Jester on above. He is aware list will be in room.   NCPASRR# [id number] H  SW sent out SNF referral.   SW left SNF list in the room.   Cecile Sheerer, MSW, LCSW Office: [phone] Cell: [phone] Fax: [phone]

## 2023-07-29 NOTE — Consult Note (Signed)
 WOC Nurse ostomy follow up Stoma type/location: LUQ colostomy Stomal assessment/size: oval 1 cm x 2 cm   Flush stoma with stomal separation at 3 o'clock.  Covered with powder and barrier ring. See photo.  Peristomal assessment: Improved irritant dermatitis, nearly resolved.  Has had no leaks for 3 days.   Treatment options for stomal/peristomal skin: Stoma powder and skin prep  barrier ring  1 piece convex pouch with belt. Output liquid brown stool Ostomy pouching: 1pc. Convex with barrier ring and belt  Education provided: We perform pouch change with son present who will be helping his mom at discharge. She has been working with staff to empty and says she is "trying"  Son is feeling more confident now that there are no leaks.  All supplies in the room if pouch leaks and needs to be changed.  Provided another folder and clinic info as they are unsure if they got one or where it is.   Enrolled patient in [place] Secure Start Discharge program: Yes Will follow.  Plan to see MOnday at 1100 for ongoing education.  Son will try and be here.  Will work with patient if not.  Mike Gip MSN, RN, FNP-BC CWON Wound, Ostomy, Continence Nurse Outpatient [facility] [phone] Pager [phone]

## 2023-07-30 DIAGNOSIS — R5381 Other malaise: Secondary | ICD-10-CM | POA: Diagnosis not present

## 2023-07-30 MED ORDER — MAGNESIUM SULFATE 2 GM/50ML IV SOLN
2.0000 g | Freq: Once | INTRAVENOUS | Status: AC
  Administered 2023-07-30: 2 g via INTRAVENOUS
  Filled 2023-07-30 (×2): qty 50

## 2023-07-30 MED ORDER — FUROSEMIDE 40 MG PO TABS
40.0000 mg | ORAL_TABLET | Freq: Every day | ORAL | Status: DC
  Administered 2023-07-31 – 2023-08-04 (×5): 40 mg via ORAL
  Filled 2023-07-30 (×5): qty 1

## 2023-07-30 MED ORDER — SODIUM CHLORIDE 0.9% FLUSH: 10.0000 mL | INTRAVENOUS | Status: DC | PRN

## 2023-07-30 MED ORDER — SODIUM CHLORIDE 0.9% FLUSH
10.0000 mL | Freq: Two times a day (BID) | INTRAVENOUS | Status: DC
  Administered 2023-07-31 – 2023-08-04 (×9): 10 mL

## 2023-07-30 NOTE — Progress Notes (Signed)
 PROGRESS NOTE   Subjective/Complaints: No new complaints this morning Discussed that 60mg  dose of lasix will start today, weight has decreased so will decrease back to 40 tomorrow   ROS: +chronic pain- better controlled today, +fatigue, +lower extremity edema- improved, +shortness of breath  Pt denies SOB, abd pain, CP, N/V/C/D, and vision changes   Objective:   No results found. No results for input(s): "WBC", "HGB", "HCT", "PLT" in the last 72 hours.   No results for input(s): "NA", "K", "CL", "CO2", "GLUCOSE", "BUN", "CREATININE", "CALCIUM" in the last 72 hours.   Intake/Output Summary (Last 24 hours) at 07/30/2023 1155 Last data filed at 07/30/2023 1016 Gross per 24 hour  Intake 420 ml  Output 200 ml  Net 220 ml        Physical Exam: Vital Signs Blood pressure (!) 158/82, pulse 89, temperature 98 F (36.7 C), resp. rate 18, weight 90.5 kg, SpO2 (!) 89%.   General: awake, alert, appropriate, sitting up in w/c;  NAD HENT: conjugate gaze; oropharynx moist CV: regular rate and rhythm; no JVD Pulmonary: CTA B/L; no W/R/R- good air movement GI: soft, NT, ND, (+)BS, ostomy L abdomen Psychiatric: appropriate- interactive  PRIOR EXAMS: Neurological: Ox3  Mental Status: AAOx3, memory grossly intact, fund of knowledge appropriate Speech/Languate: Follows commands CRANIAL NERVES: Cranial nerves II through XII grossly intact     MOTOR: RUE: 3/5 Deltoid, 4/5 Biceps, 4/5 Triceps,4/5 Grip LUE: 3/5 Deltoid, 4/5 Biceps, 4/5 Triceps, 4/5 Grip RLE: HF 4-/5, KE 4/5, ADF 4/5, APF 4/5 LLE: HF 4-/5, KE 4/5, ADF 4/5, APF 4/5   SENSORY: Normal to touch all 4 extremities   Coordination: Normal finger to nose, no tremor, no dysmetria, stable 3/13  Assessment/Plan: 1. Functional deficits which require 3+ hours per day of interdisciplinary therapy in a comprehensive inpatient rehab setting. Physiatrist is providing close  team supervision and 24 hour management of active medical problems listed below. Physiatrist and rehab team continue to assess barriers to discharge/monitor patient progress toward functional and medical goals  Care Tool:  Bathing    Body parts bathed by patient: Left arm, Chest, Abdomen, Face   Body parts bathed by helper: Right arm, Front perineal area, Buttocks, Right upper leg, Left upper leg, Right lower leg, Left lower leg     Bathing assist Assist Level: Maximal Assistance - Patient 24 - 49%     Upper Body Dressing/Undressing Upper body dressing   What is the patient wearing?: Pull over shirt    Upper body assist Assist Level: Moderate Assistance - Patient 50 - 74%    Lower Body Dressing/Undressing Lower body dressing      What is the patient wearing?: Incontinence brief, Pants     Lower body assist Assist for lower body dressing: Total Assistance - Patient < 25%     Toileting Toileting    Toileting assist Assist for toileting: Maximal Assistance - Patient 25 - 49%     Transfers Chair/bed transfer  Transfers assist  Chair/bed transfer activity did not occur: Safety/medical concerns (fatigue, pain)  Chair/bed transfer assist level: Minimal Assistance - Patient > 75%     Locomotion Ambulation   Ambulation assist   Ambulation  activity did not occur: Safety/medical concerns (fatigue, pain)  Assist level: Minimal Assistance - Patient > 75% Assistive device: Walker-rolling Max distance: 80ft   Walk 10 feet activity   Assist  Walk 10 feet activity did not occur: Safety/medical concerns (fatigue, pain)        Walk 50 feet activity   Assist Walk 50 feet with 2 turns activity did not occur: Safety/medical concerns (fatigue, pain)         Walk 150 feet activity   Assist Walk 150 feet activity did not occur: Safety/medical concerns (fatigue, pain)         Walk 10 feet on uneven surface  activity   Assist Walk 10 feet on uneven  surfaces activity did not occur: Safety/medical concerns (fatigue, pain)         Wheelchair     Assist Is the patient using a wheelchair?: Yes Type of Wheelchair: Manual Wheelchair activity did not occur: Safety/medical concerns (fatigue, pain)         Wheelchair 50 feet with 2 turns activity    Assist    Wheelchair 50 feet with 2 turns activity did not occur: Safety/medical concerns (fatigue, pain)       Wheelchair 150 feet activity     Assist  Wheelchair 150 feet activity did not occur: Safety/medical concerns (fatigue, pain)       Blood pressure (!) 158/82, pulse 89, temperature 98 F (36.7 C), resp. rate 18, weight 90.5 kg, SpO2 (!) 89%.  Medical Problem List and Plan: 1. Functional deficits secondary to septic shock/debility due to symptomatic chronic sigmoid colon stricture.  Status post diverting loop ostomy 2/28             -patient may shower if ostomy covered             -ELOS/Goals: 12-14, Min A to sup PT/OT             -Grounds pass ordered  Con't CIR PT and OT 2.  Antithrombotics: -DVT/anticoagulation:  Pharmaceutical: Lovenox 40mg  daily             -antiplatelet therapy: N/A  3. Pain Management/chronic pain: Neurontin 300 mg twice daily, Voltaren gel 2 g 3 times daily, Lidoderm patch as directed, MS Contin 15 mg every 12 hours, oxycodone changed to MRIR BID prn, Robaxin as needed  3/8- pain doing much better last 2 days- con't regimen 4. Mood/Behavior/Sleep: Cymbalta 60 mg daily             -antipsychotic agents: N/A 5. Neuropsych/cognition: This patient is capable of making decisions on her own behalf.  6. Skin/Wound Care: Routine skin checks  7. Fluids/Electrolytes/Nutrition: Routine in and outs with follow-up chemistries             Hypokalemia- Recheck labs  3/8- K+ up to 3.8  -07/26/23 labs today stable, monitor weekly  8.  Hypotension.  Resolved.  Midodrine discontinued.  Toprol-XL 50 mg daily.  Home Benicar/hydralazine on hold  resumed as needed.   -07/26/23 BPs a bit higher today but have been sporadically normal; monitor trend but may need to restart meds Vitals:   07/27/23 1420 07/27/23 2038 07/28/23 0508 07/28/23 1552  BP: (!) 140/77 (!) 145/86 (!) 147/85 123/84   07/28/23 1928 07/28/23 2349 07/28/23 2357 07/29/23 0455  BP: 132/75 (!) 152/100 (!) 174/103 (!) 146/80   07/29/23 1500 07/29/23 2007 07/29/23 2254 07/30/23 0531  BP: (!) 139/92 (!) 144/95 (!) 151/86 (!) 158/82     9.  Rheumatoid arthritis.  Chronic prednisone 10mg  daily resumed. Leflunomide/Plaquenil both restarted  10.  Hyperlipidemia.  Lipitor 10mg  daily  11.  Diabetes mellitus type 2 with peripheral neuropathy.  Hemoglobin A1c 5.6.  SSI.              -CBGs well controlled  3/8- Cbgs adequate control- con't regimen -07/26/23 CBGs not being done, but had good control before, SSI is also d/c'd so I'll let weekday team decide if this is needed still.   CBG (last 3)  No results for input(s): "GLUCAP" in the last 72 hours.   12.  Hypothyroidism.  Synthroid daily  13.  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  Chronic oxygen.  3 L at baseline prior to admission.  Prophylactic Zithromax resumed  14.  Chronic diastolic congestive heart failure.  Lasix 40mg  daily. Monitor for any signs of fluid overload. Daily weights  3/8- no weight today- yesterday 97.3 kg which was down  -07/26/23 wt stable/down, monitor Filed Weights   07/28/23 1848 07/29/23 0500 07/30/23 0500  Weight: 90 kg 98.2 kg 90.5 kg    15.  CKD stage III.  Follow-up chemistries. Last Cr 0.66 3/3  3/8- Cr 0.66   -07/26/23 Cr 0.64, go to weekly labs  16.  Iron deficiency anemia.  Follow-up CBC Monday   17.  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  Status post gamma knife 10/13.  Follow-up outpatient  18.  Morbid Obesity.  BMI 35.34:  d/c feeding supplement  19. HLD. continue atorvastatin 10mg  daily  20. Hemorrhoid: tucks ordered,  continue  21. Fatigue: B12 injection ordered 3/5  23. Chest pain: discussed that this has resolved  24. Lower extremity edema: weight decreased, decrease lasix to 40mg  daily  25. HTN: lasix ordered, continue 40mg  daily Weight reviewed and is decreased, continue this dose since having some shortness of breath -increase lopressor to 62.5mg  BID, magnesium level ordered 3/12  26. Colostomy- has diverting loop colostomy- continue colostomy education  27. Tachycardia: increase lopressor to 62.5mg  BID, supplement magnesium on 3/13  28. Magnesium deficiency: IV magnesium ordered 3/13     LOS: 10 days A FACE TO FACE EVALUATION WAS PERFORMED  Clint Bolder P Kresta Templeman 07/30/2023, 11:55 AM

## 2023-07-30 NOTE — Plan of Care (Signed)
  Problem: Consults Goal: RH GENERAL PATIENT EDUCATION Description: See Patient Education module for education specifics. Outcome: Progressing   Problem: RH BOWEL ELIMINATION Goal: RH STG MANAGE BOWEL WITH ASSISTANCE Description: STG Manage Bowel with min Assistance. Outcome: Progressing   Problem: RH BLADDER ELIMINATION Goal: RH STG MANAGE BLADDER WITH ASSISTANCE Description: STG Manage Bladder With toileting Assistance Outcome: Progressing   Problem: RH SKIN INTEGRITY Goal: RH STG ABLE TO PERFORM INCISION/WOUND CARE W/ASSISTANCE Description: STG Able To Perform Incision/Wound Care With min Assistance. Outcome: Progressing   Problem: RH SAFETY Goal: RH STG ADHERE TO SAFETY PRECAUTIONS W/ASSISTANCE/DEVICE Description: STG Adhere to Safety Precautions With cues Assistance/Device. Outcome: Progressing   Problem: RH PAIN MANAGEMENT Goal: RH STG PAIN MANAGED AT OR BELOW PT'S PAIN GOAL Description: < 4 with prns Outcome: Progressing

## 2023-07-30 NOTE — Progress Notes (Signed)
 Occupational Therapy Session Note  Patient Details  Name: [name] MRN: [id number] Date of Birth: 1940/10/09  Today's Date: 07/30/2023 OT Individual Time: 1400-1445 OT Individual Time Calculation (min): 45 min    Short Term Goals: Week 1:  OT Short Term Goal 1 (Week 1): Pt will complete sit > stand in prep for ADL with CGA using LRAD OT Short Term Goal 1 - Progress (Week 1): Not met OT Short Term Goal 2 (Week 1): Pt will complete toilet transfer with CGA using LRAD OT Short Term Goal 2 - Progress (Week 1): Met OT Short Term Goal 3 (Week 1): Pt will complete 1/3 toileting steps with CGA for balance OT Short Term Goal 3 - Progress (Week 1): Not met OT Short Term Goal 4 (Week 1): Pt will thread LB clothing with supervision using AE PRN OT Short Term Goal 4 - Progress (Week 1): Not met Week 2:  OT Short Term Goal 1 (Week 2): Pt will complete sit > stand in prep for ADL with CGA using LRAD OT Short Term Goal 2 (Week 2): Pt will complete 1/3 toileting steps with CGA for balance OT Short Term Goal 3 (Week 2): Pt will thread LB clothing with supervision using AE PRN  Skilled Therapeutic Interventions/Progress Updates:      Therapy Documentation Precautions:  Precautions Precautions: Fall Precaution/Restrictions Comments: ostomy, monitor O2 and HR Restrictions Weight Bearing Restrictions Per Provider Order: No General: "I'll go to dance group" Pt seated in W/C upon OT arrival, agreeable to OT. Daughter present for session.  Pain: no pain reported  Exercises: Pt completed the following exercise circuit in order to improve functional activity, strength and endurance to prepare for ADLs such as bathing. Pt completed the following exercises in seated position with no noted LOB/SOB and 3x10 repetitions on each exercise: -bicep curls -triceps extensions -shoulder abduction -forward punches -shoulder flexion   Other Treatments: Pt participated in individual BUE and BLE dance  exercises in social setting including reaching out of BOS, BUE and BLE ROM in all planes while seated in W/C in order to increase generalized strength/endurance to complete ADLs such as bathing with increased independence. Pt no noted LOB/SOB while participating. Pt required min VC for direction.     Pt seated in W/C at end of session with daughter at dance group, techs aware.    Therapy/Group: Individual Therapy  Velia Meyer, OTD, OTR/L 07/30/2023, 2:50 PM

## 2023-07-30 NOTE — Progress Notes (Signed)
 Physical Therapy Session Note  Patient Details  Name: [name] MRN: [id number] Date of Birth: 1941-02-09  Today's Date: 07/30/2023 PT Individual Time: 1030-1141 PT Individual Time Calculation (min): 71 min   Short Term Goals: Week 1:  PT Short Term Goal 1 (Week 1): pt will perform bed mobility with min A consistantly PT Short Term Goal 1 - Progress (Week 1): Progressing toward goal PT Short Term Goal 2 (Week 1): pt will transfer bed<>chair with LRAD and CGA PT Short Term Goal 2 - Progress (Week 1): Progressing toward goal PT Short Term Goal 3 (Week 1): pt will ambulate 108ft with LRAD and CGA PT Short Term Goal 3 - Progress (Week 1): Progressing toward goal Week 2:  PT Short Term Goal 1 (Week 2): STG=LTG due to LOS  Skilled Therapeutic Interventions/Progress Updates:   Received pt sitting in [facility] on phone with son Lyda Jester. Pt tearful regarding new plan to D/C to SNF but understands why. Reinforced to pt that she has made progress but still requires 24/7 assist and since her children and husband are unable to provide recommended assist, the safest option is to D/C to SNF. Pt agreeable to PT treatment and denied any pain during session. Session with emphasis on functional mobility/transfers, generalized strengthening and endurance, and dynamic standing balance/coordination.  Went through sensation, MMT, and pain interference questionnaire as discharge is now pending SNF placement. Pt transported to/from room in [facility] dependently. Pt declined ambulating today but agreeable to working on standing activity. Stood from [facility] with RW and min A x 3 trials (cues for 1UE on RW and 1UE on WC armrest) and worked on dynamic standing balance, upright posture, standing tolerance, and problem solving tossing horseshoes with RUE. Pt required mod cues for sequencing to play game and demonstrated severely flexed trunk in standing with inability to correct. Pt unable to remain standing >~20 seconds - denying any  pain, just reporting fatigue with activity.   Stood with RW and min A x 2 trials and required x 2 attempts to pick up object from floor using reacher with CGA for balance with cues for hand placement on RW and how to pick up object. Pt performed seated BLE strengthening on Kinetron at 30 cm/sec for 1 minute x 4 trials with BUE support and emphasis on glute/quad strength. Pt then performed the following seated exercises with emphasis on UE/LE strength and ROM: -bicep curls with 3lb dumbbell 2x10 bilaterally -horizontal chest press with 1lb dowel 2x10 -lateral trunk rotations 2x10 bilaterally with unweighted ball Pt instructed to "stop at 10" but continuing to count past 10 despite cues. Returned to room and concluded session with pt sitting in WC, needs within reach, and seatbelt alarm on.   Therapy Documentation Precautions:  Precautions Precautions: Fall Precaution/Restrictions Comments: ostomy, monitor O2 and HR Restrictions Weight Bearing Restrictions Per Provider Order: No  Therapy/Group: Individual Therapy Marlana Salvage Zaunegger Blima Rich PT, DPT 07/30/2023, 7:13 AM

## 2023-07-30 NOTE — Group Note (Signed)
 Patient Details Name: GENEVA BARRERO MRN: [id number] DOB: [birth year] Today's Date: 07/30/2023  Time Calculation: OT Group Time Calculation OT Group Start Time: 1500 OT Group Stop Time: 1530 OT Group Time Calculation (min): 30 min      Group Description: Dance Group: Pt participated in dance group with an emphasis on social interaction, motor planning, increasing overall activity tolerance and bimanual tasks. All songs were selected by group members. Dance moves included AROM of BUE/BLE gross motor movements with an emphasis on building functional endurance.    Individual level documentation: Patient completed group from sitting level. Patientt needed supervision to complete various dance moves with OT providing visual model.  Patient needed min modifications during group.  Pain:  0/10  Precautions:  Falls   Clide Deutscher 07/30/2023, 4:00 PM

## 2023-07-30 NOTE — Patient Care Conference (Cosign Needed)
 Inpatient RehabilitationTeam Conference and Plan of Care Update Date: 07/29/2023   Time: 11:45 AM    Patient Name: [name]      Medical Record Number: [id number]  Date of Birth: [birth year] Sex: Female         Room/Bed: 4W20C/4W20C-01 Payor Info: Payor: MEDICARE / Plan: MEDICARE PART A AND B / Product Type: *No Product type* /    Admit Date/Time:  07/20/2023  3:52 PM  Primary Diagnosis:  Debility  Hospital Problems: Principal Problem:   Debility Active Problems:   Hypothyroidism   Septic shock (HCC)   Type 2 diabetes mellitus with diabetic polyneuropathy, without long-term current use of insulin (HCC)   Iron deficiency anemia   Hyperlipidemia   Chronic diastolic congestive heart failure (HCC)   Anxiety state    Expected Discharge Date: Expected Discharge Date:  (SNF pending bed offer)  Team Members Present: Physician leading conference: Dr. Sula Soda Social Worker Present: Cecile Sheerer, LCSWA Nurse Present: Chana Bode, RN PT Present: Blima Rich, PT OT Present: Candee Furbish, OT PPS Coordinator present : Fae Pippin, SLP     Current Status/Progress Goal Weekly Team Focus  Bowel/Bladder   Intermittent continence with bladder. Colostomy on LLQ.   Pt to verbalize need to void at all times.   Assist with toileting as needed as well as with ostomy care and continue education    Swallow/Nutrition/ Hydration               ADL's   Min/mod A UB, Mod/max A LB and max A toileting   Goals downgraded to min A for ADLs, CGA for mobility   Barriers- global deconditioning, O2 dependence, LUE weakness, low back pain limiting erect posture    Mobility   rolling supervision, supine<>sit supervision/min A, sit<>supine max A, transfers with RW min A, gait 7ft with RW min A. Severe flexed trunk in standing (worsens with fatgue).   supervision  barriers: pain, fatigue, deconditioning, prior HR/BP issues, cognitive impairments, unsure if family can provide  24/7 assist.    Communication                Safety/Cognition/ Behavioral Observations               Pain   Pt received scheduled medications, did not request any additional pain medications throught hs.   Pain <3/10   Assess Qshift and prn    Skin   Colostomy bad in place with surgical incision OA. Localized rash on right foot. MASD on buttocks   Promote healing and prevention of skin breakdown.  Assess Qshift and prn      Discharge Planning:  Pt will d/c to home with her son; son works 3rd shift. Pt husband able to provide supervision only. Family is working 24/7 care for when his mother is released. Fam edu on Friay 1pm-4pm.  SW will confirm there are no barriers to discharge.   Team Discussion: Patient post colostomy with debility, global deconditioning with gross body pain, fatigue with cognitive deficits. Patient on target to meet rehab goals: no, currently appears to have plateaued at min assist overall. Needs supervision - min assist to sit edge of the bed and min assist for sit - supine. Neds min assist to ambulate up to 5' using a RW and then bends down forward due to pain and fatigue and cannot correct stance.   *See Care Plan and progress notes for long and short-term goals.   Revisions to Treatment Plan:  SLP  eval and treat order added Toileting protocol   Teaching Needs: Safety, medications, skin care/ostomy care, dietary modifications, transfers, toileting, etc.   Current Barriers to Discharge: Decreased caregiver support, Home enviroment access/layout, Wound care, and new ostomy  Possible Resolutions to Barriers: Family education SNF placement recommended     Medical Summary Current Status: obesity, pain, edema, hypertension, hypokalemia  Barriers to Discharge: Medical stability  Barriers to Discharge Comments: obesity, pain, edema, hypertension, hypokalemia Possible Resolutions to [employer] Focus: provide dietary education, lasix  restarted, monitor daily weights, monitor BP TID, monitor potassium, received oral and IV supplementation, continue scheduled morphine and prn   Continued Need for Acute Rehabilitation Level of Care: The patient requires daily medical management by a physician with specialized training in physical medicine and rehabilitation for the following reasons: Direction of a multidisciplinary physical rehabilitation program to maximize functional independence : Yes Medical management of patient stability for increased activity during participation in an intensive rehabilitation regime.: Yes Analysis of laboratory values and/or radiology reports with any subsequent need for medication adjustment and/or medical intervention. : Yes   I attest that I was present, lead the team conference, and concur with the assessment and plan of the team.   Chana Bode B 07/30/2023, 2:12 PM

## 2023-07-30 NOTE — Progress Notes (Signed)
 Physical Therapy Session Note  Patient Details  Name: [name] MRN: [id number] Date of Birth: [birth year]  Today's Date: 07/30/2023 PT Individual Time: 0820-0925 PT Individual Time Calculation (min): 65 min   Short Term Goals: Week 2:  PT Short Term Goal 1 (Week 2): STG=LTG due to LOS  Skilled Therapeutic Interventions/Progress Updates:    Chart reviewed and pt agreeable to therapy. Pt received semi-reclined in bed with no c/o pain. Session focused on functional transfers and pre-gait activities to promote return to amb and home access. Pt initiated session with roll to R using CGA + bedrail and side to sit using minA + bedrail + HOB elevated to 15deg. Pt then completed sit to stand in STEDY using CGA and STEDY + S for balance during transfer to toilet.  Pt required S for pericare. Pt returned to sit EOB using STERDY. Pt then completed multiple practices of sit to stand at different heights. Pt stood using MinA + RW + elevated bed. Pt required rest break for activity tolerance. Pt continued sit to stand practice with same assist level. Pt required VC for sequencing. Bed lowered by 2 inches and pt able to complete sit to stand using minA + RW. Bed again lowered to WC height. Pt required minA + RW to complete sit to stand. In standing, pt completed lateral weight shifts using CGA for balance. Pt then transferred to [facility] using CGA + STEDY. In chair, pt completed grooming tasks of teeth brushing with S. Pt then completed another sit to stand in WC using minA + RW. Pt noted feeling very weak and stated she had not eaten breakfast yet. Pt politely indicating that she does not have energy to continue session. Pt missed 10 mins of therapy 2/2 breakfast. At end of session, pt was left seated in [facility] with alarm engaged, nurse call bell and all needs in reach.    Therapy Documentation Precautions:  Precautions Precautions: Fall Precaution/Restrictions Comments: ostomy, monitor O2 and  HR Restrictions Weight Bearing Restrictions Per Provider Order: No General: PT Amount of Missed Time (min): 10 Minutes PT Missed Treatment Reason: Patient fatigue;Other (Comment) (breakfast)    Therapy/Group: Individual Therapy  Dionne Milo, PT, DPT 07/30/2023, 9:35 AM

## 2023-07-31 DIAGNOSIS — R5381 Other malaise: Secondary | ICD-10-CM | POA: Diagnosis not present

## 2023-07-31 LAB — BASIC METABOLIC PANEL
Anion gap: 12 (ref 5–15)
BUN: 14 mg/dL (ref 8–23)
CO2: 25 mmol/L (ref 22–32)
Calcium: 8.5 mg/dL — ABNORMAL LOW (ref 8.9–10.3)
Chloride: 106 mmol/L (ref 98–111)
Creatinine, Ser: 0.99 mg/dL (ref 0.44–1.00)
GFR, Estimated: 57 mL/min — ABNORMAL LOW (ref >60)
Glucose, Bld: 162 mg/dL — ABNORMAL HIGH (ref 70–99)
Potassium: 4.3 mmol/L (ref 3.5–5.1)
Sodium: 143 mmol/L (ref 135–145)

## 2023-07-31 LAB — MAGNESIUM: Magnesium: 2.1 mg/dL (ref 1.7–2.4)

## 2023-07-31 NOTE — Progress Notes (Signed)
 Occupational Therapy Session Note  Patient Details  Name: [name] MRN: [id number] Date of Birth: 12/09/1940  Today's Date: 07/31/2023 OT Individual Time: 1050-1200 OT Individual Time Calculation (min): 70 min    Short Term Goals: Week 2:  OT Short Term Goal 1 (Week 2): Pt will complete sit > stand in prep for ADL with CGA using LRAD OT Short Term Goal 2 (Week 2): Pt will complete 1/3 toileting steps with CGA for balance OT Short Term Goal 3 (Week 2): Pt will thread LB clothing with supervision using AE PRN  Skilled Therapeutic Interventions/Progress Updates:  Skilled OT intervention completed with focus on ADL retraining, functional endurance, and mobility within a shower context. Pt received seated in recliner, agreeable to session. Lower back pain reported; pre-medicated. OT offered rest breaks, repositioning and moist heat via shower for pain relief.  Pt completed all sit > stands with CGA/min A and dependent transfers in stedy for time and energy conservation. Cues needed for erect posture for clearance of stedy flaps.  Stedy transfer > TTB in shower. Per nurse, okay to leave ostomy bag uncovered, just avoid direct spray of water however waterproof cover applied to IV prior to shower. Pt was able to bathe all parts with min A, at the seated level using lateral leans and posterior leans for pericare assist. Utilized long handled sponge to access BLE. Cues needed for thoroughness of rinsing. Stedy transfer > EOB. Assisted pt with application of lotion to BLE, and powder underneath breast and abdominal skin folds. Ostomy noted to be intact without broken seal however nurse notified of post shower status for monitoring. Required max A for donning ostomy belt, mod A for bra and shirt management due to lack of BUE ROM. Threaded LB clothing with total A and stood in stedy for donning over hips with max A. Dependent for socks.  Required mod A for sit > supine for BLE, then trendelenburg  position for boosting > HOB with pt assisting using bed rails and BLE. Pt remained semi upright in bed, with bed alarm on/activated, and with all needs in reach at end of session.   Therapy Documentation Precautions:  Precautions Precautions: Fall Precaution/Restrictions Comments: ostomy, monitor O2 and HR Restrictions Weight Bearing Restrictions Per Provider Order: No    Therapy/Group: Individual Therapy  Melvyn Novas, MS, OTR/L  07/31/2023, 12:21 PM

## 2023-07-31 NOTE — Progress Notes (Signed)
 Physical Therapy Session Note  Patient Details  Name: [name] MRN: [id number] Date of Birth: 06-16-1940  Today's Date: 07/31/2023 PT Individual Time: 7829-5621 and 1400-1455 PT Individual Time Calculation (min): 93 min and 55 min  Short Term Goals: Week 1:  PT Short Term Goal 1 (Week 1): pt will perform bed mobility with min A consistantly PT Short Term Goal 1 - Progress (Week 1): Progressing toward goal PT Short Term Goal 2 (Week 1): pt will transfer bed<>chair with LRAD and CGA PT Short Term Goal 2 - Progress (Week 1): Progressing toward goal PT Short Term Goal 3 (Week 1): pt will ambulate 42ft with LRAD and CGA PT Short Term Goal 3 - Progress (Week 1): Progressing toward goal Week 2:  PT Short Term Goal 1 (Week 2): STG=LTG due to LOS  Skilled Therapeutic Interventions/Progress Updates:   Treatment Session 1 Received pt supine in bed with RN and NT at bedside changing pt. Pt agreeable to PT treatment and denied any pain during session. Session with emphasis on functional mobility/transfers, dressing, generalized strengthening and endurance, dynamic standing balance/coordination, and gait training. Pt required frequent rest breaks throughout session due to fatigue and deconditioning. Pt transferred semi-reclined<>sitting R EOB with HOB elevated and use of bedrails with supervision and increased time and effort to scoot to EOB. Donned pants and non-skid socks with max A. RN notified and present to disconnect IV, then doffed dirty shirt and donned clean one with mod A.     Pt performed all transfers with RW and min A throughout session. Pt transported to/from room in [facility] dependently for time management purposes. Set pt up for simulated car transfer but noted IV coming out. Transported to nurses station and RN secured IV and contacted IV team to adjust. Pt performed the following exercises with emphasis on LE strength/ROM: -heel slides on towel x20 bilaterally -hip adduction ball  squeezes 2x10 with 3 second isometric hold -knee extension with 1.5lb ankle weight 2x15 bilaterally  -hip flexion 2x15 bilaterally  Pt ambulated 70ft x 1 and 4ft x 1 with RW and min A with close WC follow - max cues for upright posture/gaze but pt unable to achieve fully upright position. Stood x 5 trials with RW and min A and tossed beanbags using RUE with min A for balance - emphasis on upright posture and standing tolerance. Cued provided to avoid resting elbows on RW for support. Returned to room and transferred into recliner with RW and min A. Concluded session with pt sitting in recliner on [place], needs within reach, and seatbelt alarm on.   Treatment Session 2 Received pt semi-reclined in bed, pt agreeable to PT treatment, and denied any pain during session. Session with emphasis on functional mobility/transfers, generalized strengthening and endurance, dynamic standing balance/coordination, simulated car transfers, and gait training. Pt transferred semi-reclined<>sitting R EOB with HOB elevated and use of bedrails with supervision and increased time/effort. Noted pants wet - stood from EOB with RW and min A x 2 trials and removed soiled pants (brief dry) and donned clean ones sitting with max A. Pt required max A to pull pants over hips, then transferred into WC via stand<>pivot with RW and min A. Pt transported outside to entrance of [facility] in WC dependently for change in environmental stimuli and to uplift mood.   Pt transferred to/from bench with RW and min A and performed the following exercises with emphasis on LE/UE strength/ROM: -knee extensions 2x20 bilaterally -hip flexion 2x20 bilaterally -hamstring curls with  yellow TB 2x20 bilaterally -hip abduction with yellow TB 2x20 -heel raises 2x20 bilaterally -rows with yellow TB 2x20 Encouraged ambulating outside, however pt politely refused stating "not right now, I'm enjoying just relaxing outside". Encouraged standing exercises, but again pt  declining. Picked flower for pt and pt smiling smelling and touching flower. Pt appreciate of time outside stating "this is so nice". Returned to room and concluded session with pt sitting in WC, needs within reach, and seatbelt alarm on.   Therapy Documentation Precautions:  Precautions Precautions: Fall Precaution/Restrictions Comments: ostomy, monitor O2 and HR Restrictions Weight Bearing Restrictions Per Provider Order: No  Therapy/Group: Individual Therapy Marlana Salvage Zaunegger Blima Rich PT, DPT 07/31/2023, 6:49 AM

## 2023-07-31 NOTE — Progress Notes (Signed)
 PROGRESS NOTE   Subjective/Complaints: No new complaints this morning Weight is stable, continue lasix 40, recheck magnesium and potassium today Vitals improved   ROS: +chronic pain- better controlled today, +fatigue, +lower extremity edema- improved, +shortness of breath- stable  Pt denies SOB, abd pain, CP, N/V/C/D, and vision changes   Objective:   No results found. No results for input(s): "WBC", "HGB", "HCT", "PLT" in the last 72 hours.   No results for input(s): "NA", "K", "CL", "CO2", "GLUCOSE", "BUN", "CREATININE", "CALCIUM" in the last 72 hours.   Intake/Output Summary (Last 24 hours) at 07/31/2023 1359 Last data filed at 07/31/2023 1346 Gross per 24 hour  Intake --  Output 325 ml  Net -325 ml        Physical Exam: Vital Signs Blood pressure (!) 143/81, pulse 85, temperature (!) 97.5 F (36.4 C), resp. rate 16, weight 90.9 kg, SpO2 98%.   General: awake, alert, appropriate, sitting up in w/c;  NAD HENT: conjugate gaze; oropharynx moist CV: regular rate and rhythm; no JVD Pulmonary: CTA B/L; no W/R/R- good air movement GI: soft, NT, ND, (+)BS, ostomy L abdomen Psychiatric: appropriate- interactive Neurological: Ox3  Mental Status: AAOx3, memory grossly intact, fund of knowledge appropriate Speech/Languate: Follows commands CRANIAL NERVES: Cranial nerves II through XII grossly intact     MOTOR: RUE: 3/5 Deltoid, 4/5 Biceps, 4/5 Triceps,4/5 Grip LUE: 4/5 Deltoid, 4/5 Biceps, 4/5 Triceps, 4/5 Grip RLE: HF 4-/5, KE 4/5, ADF 4/5, APF 4/5 LLE: HF 4-/5, KE 4/5, ADF 4/5, APF 4/5, improved 3/14   SENSORY: Normal to touch all 4 extremities   Coordination: Normal finger to nose, no tremor, no dysmetria  Assessment/Plan: 1. Functional deficits which require 3+ hours per day of interdisciplinary therapy in a comprehensive inpatient rehab setting. Physiatrist is providing close team supervision and 24  hour management of active medical problems listed below. Physiatrist and rehab team continue to assess barriers to discharge/monitor patient progress toward functional and medical goals  Care Tool:  Bathing    Body parts bathed by patient: Right arm, Left arm, Chest, Abdomen, Right upper leg, Left upper leg, Right lower leg, Left lower leg, Face   Body parts bathed by helper: Front perineal area, Buttocks     Bathing assist Assist Level: Minimal Assistance - Patient > 75%     Upper Body Dressing/Undressing Upper body dressing   What is the patient wearing?: Pull over shirt, Bra    Upper body assist Assist Level: Moderate Assistance - Patient 50 - 74%    Lower Body Dressing/Undressing Lower body dressing      What is the patient wearing?: Incontinence brief, Pants     Lower body assist Assist for lower body dressing: Maximal Assistance - Patient 25 - 49%     Toileting Toileting    Toileting assist Assist for toileting: Maximal Assistance - Patient 25 - 49%     Transfers Chair/bed transfer  Transfers assist  Chair/bed transfer activity did not occur: Safety/medical concerns (fatigue, pain)  Chair/bed transfer assist level: Minimal Assistance - Patient > 75%     Locomotion Ambulation   Ambulation assist   Ambulation activity did not occur: Safety/medical concerns (fatigue, pain)  Assist level: Minimal Assistance - Patient > 75% Assistive device: Walker-rolling Max distance: 31ft   Walk 10 feet activity   Assist  Walk 10 feet activity did not occur: Safety/medical concerns (fatigue, pain)        Walk 50 feet activity   Assist Walk 50 feet with 2 turns activity did not occur: Safety/medical concerns (fatigue, pain)         Walk 150 feet activity   Assist Walk 150 feet activity did not occur: Safety/medical concerns (fatigue, pain)         Walk 10 feet on uneven surface  activity   Assist Walk 10 feet on uneven surfaces activity did  not occur: Safety/medical concerns (fatigue, pain)         Wheelchair     Assist Is the patient using a wheelchair?: Yes Type of Wheelchair: Manual Wheelchair activity did not occur: Safety/medical concerns (fatigue, pain)         Wheelchair 50 feet with 2 turns activity    Assist    Wheelchair 50 feet with 2 turns activity did not occur: Safety/medical concerns (fatigue, pain)       Wheelchair 150 feet activity     Assist  Wheelchair 150 feet activity did not occur: Safety/medical concerns (fatigue, pain)       Blood pressure (!) 143/81, pulse 85, temperature (!) 97.5 F (36.4 C), resp. rate 16, weight 90.9 kg, SpO2 98%.  Medical Problem List and Plan: 1. Functional deficits secondary to septic shock/debility due to symptomatic chronic sigmoid colon stricture.  Status post diverting loop ostomy 2/28             -patient may shower if ostomy covered             -ELOS/Goals: 12-14, Min A to sup PT/OT             -Grounds pass ordered  Con't CIR PT and OT 2.  Antithrombotics: -DVT/anticoagulation:  Pharmaceutical: Lovenox 40mg  daily             -antiplatelet therapy: N/A  3. Pain Management/chronic pain: Neurontin 300 mg twice daily, Voltaren gel 2 g 3 times daily, Lidoderm patch as directed, MS Contin 15 mg every 12 hours, oxycodone changed to MRIR BID prn, Robaxin as needed  3/8- pain doing much better last 2 days- con't regimen 4. Mood/Behavior/Sleep: Cymbalta 60 mg daily             -antipsychotic agents: N/A 5. Neuropsych/cognition: This patient is capable of making decisions on her own behalf.  6. Skin/Wound Care: Routine skin checks  7. Fluids/Electrolytes/Nutrition: Routine in and outs with follow-up chemistries             Hypokalemia- Recheck labs  3/8- K+ up to 3.8  -07/26/23 labs today stable, monitor weekly  8.  Hypotension.  Resolved.  Midodrine discontinued.  Toprol-XL 50 mg daily.  Home Benicar/hydralazine on hold resumed as needed.    -07/26/23 BPs a bit higher today but have been sporadically normal; monitor trend but may need to restart meds Vitals:   07/28/23 1928 07/28/23 2349 07/28/23 2357 07/29/23 0455  BP: 132/75 (!) 152/100 (!) 174/103 (!) 146/80   07/29/23 1500 07/29/23 2007 07/29/23 2254 07/30/23 0531  BP: (!) 139/92 (!) 144/95 (!) 151/86 (!) 158/82   07/30/23 1325 07/30/23 2156 07/31/23 0106 07/31/23 0618  BP: 132/75 (!) 154/90 (!) 154/93 (!) 143/81     9.  Rheumatoid arthritis.  Chronic prednisone 10mg  daily resumed. Leflunomide/Plaquenil  both restarted  10.  Hyperlipidemia.  Lipitor 10mg  daily  11.  Diabetes mellitus type 2 with peripheral neuropathy.  Hemoglobin A1c 5.6.  SSI.              -CBGs well controlled  3/8- Cbgs adequate control- con't regimen -07/26/23 CBGs not being done, but had good control before, SSI is also d/c'd so I'll let weekday team decide if this is needed still.   CBG (last 3)  No results for input(s): "GLUCAP" in the last 72 hours.   12.  Hypothyroidism.  Synthroid daily  13.  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  Chronic oxygen.  3 L at baseline prior to admission.  Prophylactic Zithromax resumed  14.  Chronic diastolic congestive heart failure.  Lasix 40mg  daily. Monitor for any signs of fluid overload. Daily weights  3/8- no weight today- yesterday 97.3 kg which was down  -07/26/23 wt stable/down, monitor Filed Weights   07/29/23 0500 07/30/23 0500 07/31/23 0621  Weight: 98.2 kg 90.5 kg 90.9 kg    15.  CKD stage III.  Follow-up chemistries. Last Cr 0.66 3/3  3/8- Cr 0.66   -07/26/23 Cr 0.64, go to weekly labs  16.  Iron deficiency anemia.  Follow-up CBC Monday   17.  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  Status post gamma knife 10/13.  Follow-up outpatient  18.  Morbid Obesity.  BMI 35.34:  d/c feeding supplement  19. HLD. continue atorvastatin 10mg  daily  20. Hemorrhoid: tucks ordered, continue  21. Fatigue:  B12 injection ordered 3/5  23. Chest pain: discussed that this has resolved  24. Lower extremity edema: weight stable, continue lasix 40mg  daily  25. HTN: lasix ordered, continue 40mg  daily Weight reviewed and is decreased, continue this dose since having some shortness of breath -increase lopressor to 62.5mg  BID, recheck magnesium and potassium and supplement as needed  26. Colostomy- has diverting loop colostomy- continue colostomy education  27. Tachycardia: increase lopressor to 62.5mg  BID, supplement magnesium on 3/13, resolved, continue this dose  28. Magnesium deficiency: IV magnesium ordered 3/13, lab recheck ordered 3/14     LOS: 11 days A FACE TO FACE EVALUATION WAS PERFORMED  Clint Bolder P Keili Hasten 07/31/2023, 1:59 PM

## 2023-08-01 DIAGNOSIS — R5381 Other malaise: Secondary | ICD-10-CM | POA: Diagnosis not present

## 2023-08-01 DIAGNOSIS — I10 Essential (primary) hypertension: Secondary | ICD-10-CM | POA: Diagnosis not present

## 2023-08-01 NOTE — Progress Notes (Signed)
   08/01/23 1849  Provider Notification  Provider Name/Title mercedes  Date Provider Notified 08/01/23  Time Provider Notified 1906  Method of Notification Call  Notification Reason Fall  Provider response No new orders  Date of Provider Response 08/01/23  Time of Provider Response 1906  Follow Up  Family notified Yes - comment  Time family notified 1930  Progress note created (see row info) Yes  Adult Fall Risk Assessment  Risk Factor Category (scoring not indicated) Not Applicable  Vitals  Temp 97.7 F (36.5 C)  BP (!) 157/103  MAP (mmHg) 118  BP Location Left Arm  BP Method Automatic  Patient Position (if appropriate) Sitting  Pulse Rate (!) 105  Pulse Rate Source Monitor  Resp 16  Oxygen Therapy  SpO2 96 %  O2 Device Room Air  Pain Assessment  Pain Scale 0-10  Pain Score 0  Neurological  Neuro (WDL) WDL  Musculoskeletal  Musculoskeletal (WDL) X  Integumentary  Integumentary (WDL) X  Skin Color Appropriate for ethnicity  Ecchymosis Location Abdomen  Ecchymosis Location Orientation Right;Left  Erythema/Redness Location Buttocks  Erythema/Redness Location Orientation Bilateral

## 2023-08-01 NOTE — Progress Notes (Signed)
 PROGRESS NOTE   Subjective/Complaints:  Pt doing well, slept well, pain well managed, LBM yesterday per pt (ostomy output looks good per documentation), urinating ok, denies any other complaints or concerns today.    ROS: +chronic pain- better controlled today, +fatigue, +lower extremity edema- improved, +shortness of breath- stable  Pt denies SOB, abd pain, CP, N/V/C/D, and vision changes   Objective:   No results found. No results for input(s): "WBC", "HGB", "HCT", "PLT" in the last 72 hours.   Recent Labs    07/31/23 1509  NA 143  K 4.3  CL 106  CO2 25  GLUCOSE 162*  BUN 14  CREATININE 0.99  CALCIUM 8.5*     Intake/Output Summary (Last 24 hours) at 08/01/2023 1155 Last data filed at 08/01/2023 0844 Gross per 24 hour  Intake 724 ml  Output 475 ml  Net 249 ml        Physical Exam: Vital Signs Blood pressure (!) 140/79, pulse 91, temperature 97.7 F (36.5 C), temperature source Oral, resp. rate 18, weight 88.8 kg, SpO2 91%.   General: awake, alert, appropriate, resting in bed;  NAD HENT: conjugate gaze; oropharynx moist CV: regular rate and rhythm; no JVD Pulmonary: CTA B/L; no W/R/R- good air movement GI: soft, NT, ND, (+)BS, ostomy L abdomen Psychiatric: appropriate- interactive  PRIOR EXAMS: Neurological: Ox3  Mental Status: AAOx3, memory grossly intact, fund of knowledge appropriate Speech/Languate: Follows commands CRANIAL NERVES: Cranial nerves II through XII grossly intact     MOTOR: RUE: 3/5 Deltoid, 4/5 Biceps, 4/5 Triceps,4/5 Grip LUE: 4/5 Deltoid, 4/5 Biceps, 4/5 Triceps, 4/5 Grip RLE: HF 4-/5, KE 4/5, ADF 4/5, APF 4/5 LLE: HF 4-/5, KE 4/5, ADF 4/5, APF 4/5, improved 3/14   SENSORY: Normal to touch all 4 extremities   Coordination: Normal finger to nose, no tremor, no dysmetria  Assessment/Plan: 1. Functional deficits which require 3+ hours per day of interdisciplinary therapy  in a comprehensive inpatient rehab setting. Physiatrist is providing close team supervision and 24 hour management of active medical problems listed below. Physiatrist and rehab team continue to assess barriers to discharge/monitor patient progress toward functional and medical goals  Care Tool:  Bathing    Body parts bathed by patient: Right arm, Left arm, Chest, Abdomen, Right upper leg, Left upper leg, Right lower leg, Left lower leg, Face   Body parts bathed by helper: Front perineal area, Buttocks     Bathing assist Assist Level: Minimal Assistance - Patient > 75%     Upper Body Dressing/Undressing Upper body dressing   What is the patient wearing?: Pull over shirt, Bra    Upper body assist Assist Level: Moderate Assistance - Patient 50 - 74%    Lower Body Dressing/Undressing Lower body dressing      What is the patient wearing?: Incontinence brief, Pants     Lower body assist Assist for lower body dressing: Maximal Assistance - Patient 25 - 49%     Toileting Toileting    Toileting assist Assist for toileting: Maximal Assistance - Patient 25 - 49%     Transfers Chair/bed transfer  Transfers assist  Chair/bed transfer activity did not occur: Safety/medical concerns (fatigue, pain)  Chair/bed transfer assist level: Minimal Assistance - Patient > 75%     Locomotion Ambulation   Ambulation assist   Ambulation activity did not occur: Safety/medical concerns (fatigue, pain)  Assist level: Minimal Assistance - Patient > 75% Assistive device: Walker-rolling Max distance: 33ft   Walk 10 feet activity   Assist  Walk 10 feet activity did not occur: Safety/medical concerns (fatigue, pain)        Walk 50 feet activity   Assist Walk 50 feet with 2 turns activity did not occur: Safety/medical concerns (fatigue, pain)         Walk 150 feet activity   Assist Walk 150 feet activity did not occur: Safety/medical concerns (fatigue, pain)          Walk 10 feet on uneven surface  activity   Assist Walk 10 feet on uneven surfaces activity did not occur: Safety/medical concerns (fatigue, pain)         Wheelchair     Assist Is the patient using a wheelchair?: Yes Type of Wheelchair: Manual Wheelchair activity did not occur: Safety/medical concerns (fatigue, pain)         Wheelchair 50 feet with 2 turns activity    Assist    Wheelchair 50 feet with 2 turns activity did not occur: Safety/medical concerns (fatigue, pain)       Wheelchair 150 feet activity     Assist  Wheelchair 150 feet activity did not occur: Safety/medical concerns (fatigue, pain)       Blood pressure (!) 140/79, pulse 91, temperature 97.7 F (36.5 C), temperature source Oral, resp. rate 18, weight 88.8 kg, SpO2 91%.  Medical Problem List and Plan: 1. Functional deficits secondary to septic shock/debility due to symptomatic chronic sigmoid colon stricture.  Status post diverting loop ostomy 2/28             -patient may shower if ostomy covered             -ELOS/Goals: 12-14, Min A to sup PT/OT             -Grounds pass ordered  Con't CIR PT and OT 2.  Antithrombotics: -DVT/anticoagulation:  Pharmaceutical: Lovenox 40mg  daily             -antiplatelet therapy: N/A  3. Pain Management/chronic pain: Neurontin 300 mg twice daily, Voltaren gel 2 g 3 times daily, Lidoderm patch as directed, MS Contin 15 mg every 12 hours, oxycodone changed to MRIR BID prn, Robaxin as needed  3/8- pain doing much better last 2 days- con't regimen 4. Mood/Behavior/Sleep: Cymbalta 60 mg daily             -antipsychotic agents: N/A 5. Neuropsych/cognition: This patient is capable of making decisions on her own behalf.  6. Skin/Wound Care: Routine skin checks  7. Fluids/Electrolytes/Nutrition: Routine in and outs with follow-up chemistries             Hypokalemia- Recheck labs  3/8- K+ up to 3.8  -07/26/23 labs today stable, monitor weekly  8.   Hypotension-resolved/now HTN.  Midodrine discontinued.  Toprol-XL 50 mg daily, spironolactone 12.5mg  daily.  Home Benicar/hydralazine on hold resumed as needed.   -07/26/23 BPs a bit higher today but have been sporadically normal; monitor trend but may need to restart meds -08/01/23 BPs a bit up but overall stable for last couple days; Metoprolol now at 62.5mg  BID, just changed a couple days ago; monitor for now Vitals:   07/29/23 0455 07/29/23 1500 07/29/23 2007 07/29/23 2254  BP: (!) 146/80 (!) 139/92 (!) 144/95 (!) 151/86   07/30/23 0531 07/30/23 1325 07/30/23 2156 07/31/23 0106  BP: (!) 158/82 132/75 (!) 154/90 (!) 154/93   07/31/23 0618 07/31/23 1945 08/01/23 0532 08/01/23 1001  BP: (!) 143/81 (!) 142/87 (!) 156/87 (!) 140/79     9.  Rheumatoid arthritis.  Chronic prednisone 10mg  daily resumed. Leflunomide/Plaquenil both restarted  10.  Hyperlipidemia.  Lipitor 10mg  daily  11.  Diabetes mellitus type 2 with peripheral neuropathy.  Hemoglobin A1c 5.6.  SSI.              -CBGs well controlled  3/8- Cbgs adequate control- con't regimen -07/26/23 CBGs not being done, but had good control before, SSI is also d/c'd so I'll let weekday team decide if this is needed still. D/c'd     12.  Hypothyroidism.  Synthroid daily  13.  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  Chronic oxygen.  3 L at baseline prior to admission.  Prophylactic Zithromax resumed  14.  Chronic diastolic congestive heart failure.  Lasix 40mg  daily. Monitor for any signs of fluid overload. Daily weights  3/8- no weight today- yesterday 97.3 kg which was down  -08/01/23 wt stable/down, monitor Filed Weights   07/30/23 0500 07/31/23 0621 08/01/23 0500  Weight: 90.5 kg 90.9 kg 88.8 kg    15.  CKD stage III.  Follow-up chemistries. Last Cr 0.66 3/3  3/8- Cr 0.66   -07/26/23 Cr 0.64, go to weekly labs-- stable 3/14  16.  Iron deficiency anemia.  Follow-up CBC Monday   17.  History of meningioma left  sphenoid wing involving cavernous sinus.  Status post gamma knife 10/13.  Follow-up outpatient  18.  Morbid Obesity.  BMI 35.34:  d/c feeding supplement  19. HLD. continue atorvastatin 10mg  daily  20. Hemorrhoid: tucks ordered, continue  21. Fatigue: B12 injection ordered 3/5  23. Chest pain: discussed that this has resolved  24. Lower extremity edema: weight stable, continue lasix 40mg  daily  25. HTN: lasix ordered, continue 40mg  daily--see #8 above Weight reviewed and is decreased, continue this dose since having some shortness of breath -increase lopressor to 62.5mg  BID, recheck magnesium and potassium and supplement as needed  26. Colostomy- has diverting loop colostomy- continue colostomy education  27. Tachycardia: increase lopressor to 62.5mg  BID, supplement magnesium on 3/13, resolved, continue this dose  28. Magnesium deficiency: IV magnesium ordered 3/13, lab recheck ordered 3/14-- WNL     LOS: 12 days A FACE TO FACE EVALUATION WAS PERFORMED  [address] 08/01/2023, 11:55 AM

## 2023-08-01 NOTE — Progress Notes (Signed)
 Occupational Therapy Session Note  Patient Details  Name: [name] MRN: [id number] Date of Birth: 1940-09-23  Today's Date: 08/01/2023 OT Individual Time: 6644-0347 OT Individual Time Calculation (min): 38 min  and Today's Date: 08/01/2023 OT Missed Time: 7 Minutes Missed Time Reason: Other (comment) (irritability)   Short Term Goals: Week 2:  OT Short Term Goal 1 (Week 2): Pt will complete sit > stand in prep for ADL with CGA using LRAD OT Short Term Goal 2 (Week 2): Pt will complete 1/3 toileting steps with CGA for balance OT Short Term Goal 3 (Week 2): Pt will thread LB clothing with supervision using AE PRN  Skilled Therapeutic Interventions/Progress Updates:  Skilled OT intervention completed with focus on ADL retraining, activity tolerance. Pt received upright in bed, agreeable to session. No pain reported.  Pt transitioned to EOB with supervision and + time with heavy reliance on bed rails. CGA sit > stand from slightly elevated bed using RW, then CGA stand pivot with RW > w/c with RW. Upon transfer, OT noted that pt's back of pants were saturated. OT inquired about pt's recent bathroom opportunity. Pt declined need to toilet but did feel wetness on back of pants. Pt agreeable to change pants. Stood with CGA using RW, then max A to lower pants, and upon lowering of pants, brief noted to be heavily incontinent. OT encouraged change of brief and pants to maintain skin integrity. Pt became defensive, stating "these briefs do not work for me, mine at home never gave me a problem" and "I refuse to go to the bathroom every 30 mins like you told me to do." OT provided gentle re-education that timed toileting is suggested every 2-4 hours not 30 mins, and if she prefers her personal briefs we could trial however pt became very dismissive.  Stood with CGA with RW, with pt using both elbows on RW with minimal correction during LB changing at max A level. OT offered other self-care  opportunities including brushing teeth as pt reported she hadn't completed all day, however pt declined. Transported <> gym.   Initiated clip activity at table top for gentle reaching however pt without warning started telling therapist that she didn't appreciate OT making her change her LB clothing, or recommending teeth brushing. OT attempted to provide gentle re-orientation and education on OT purpose for self-care offer and for changing of wet clothing to prevent skin breakdown, however pt then started becoming increasingly irritable, yelling at OT to "shut up." OT terminated session due to inability to redirect pt, therefore pt missed 7 mins of OT intervention secondary to irritability; OT will make up missed time as able.  Back in room, pt remained seated in w/c, with belt alarm on/activated, and with all needs in reach at end of session.   Therapy Documentation Precautions:  Precautions Precautions: Fall Precaution/Restrictions Comments: ostomy, monitor O2 and HR Restrictions Weight Bearing Restrictions Per Provider Order: No    Therapy/Group: Individual Therapy  Melvyn Novas, MS, OTR/L  08/01/2023, 3:02 PM

## 2023-08-02 DIAGNOSIS — R5381 Other malaise: Secondary | ICD-10-CM | POA: Diagnosis not present

## 2023-08-02 DIAGNOSIS — I10 Essential (primary) hypertension: Secondary | ICD-10-CM | POA: Diagnosis not present

## 2023-08-02 NOTE — Plan of Care (Signed)
  Problem: RH BLADDER ELIMINATION Goal: RH STG MANAGE BLADDER WITH ASSISTANCE Description: STG Manage Bladder With toileting Assistance Outcome: Progressing   Problem: RH SKIN INTEGRITY Goal: RH STG ABLE TO PERFORM INCISION/WOUND CARE W/ASSISTANCE Description: STG Able To Perform Incision/Wound Care With min Assistance. Outcome: Progressing   Problem: RH SAFETY Goal: RH STG ADHERE TO SAFETY PRECAUTIONS W/ASSISTANCE/DEVICE Description: STG Adhere to Safety Precautions With cues Assistance/Device. Outcome: Progressing   Problem: RH PAIN MANAGEMENT Goal: RH STG PAIN MANAGED AT OR BELOW PT'S PAIN GOAL Description: < 4 with prns Outcome: Progressing

## 2023-08-02 NOTE — Progress Notes (Signed)
 PROGRESS NOTE   Subjective/Complaints:  Pt doing well again, slept ok, pain well managed, ostomy output normal per pt, urinating ok, denies any other complaints or concerns today. Had an assisted fall yesterday, no injuries, was assisted down.    ROS: +chronic pain- better controlled today, +fatigue, +lower extremity edema- improved, +shortness of breath- stable  Pt denies SOB, abd pain, CP, N/V/C/D, and vision changes   Objective:   No results found. No results for input(s): "WBC", "HGB", "HCT", "PLT" in the last 72 hours.   Recent Labs    07/31/23 1509  NA 143  K 4.3  CL 106  CO2 25  GLUCOSE 162*  BUN 14  CREATININE 0.99  CALCIUM 8.5*     Intake/Output Summary (Last 24 hours) at 08/02/2023 0945 Last data filed at 08/01/2023 1833 Gross per 24 hour  Intake 177 ml  Output 425 ml  Net -248 ml        Physical Exam: Vital Signs Blood pressure (!) 141/83, pulse 88, temperature (!) 97.5 F (36.4 C), temperature source Oral, resp. rate 18, weight 87.7 kg, SpO2 99%.   General: asleep but easily awoken, alert, appropriate, resting in bed;  NAD HENT: conjugate gaze; oropharynx moist CV: regular rate and rhythm; no JVD Pulmonary: CTA B/L; no W/R/R- good air movement GI: soft, NT, ND, (+)BS, ostomy L abdomen with minimal liquidy stool present Psychiatric: appropriate- interactive  PRIOR EXAMS: Neurological: Ox3  Mental Status: AAOx3, memory grossly intact, fund of knowledge appropriate Speech/Languate: Follows commands CRANIAL NERVES: Cranial nerves II through XII grossly intact     MOTOR: RUE: 3/5 Deltoid, 4/5 Biceps, 4/5 Triceps,4/5 Grip LUE: 4/5 Deltoid, 4/5 Biceps, 4/5 Triceps, 4/5 Grip RLE: HF 4-/5, KE 4/5, ADF 4/5, APF 4/5 LLE: HF 4-/5, KE 4/5, ADF 4/5, APF 4/5, improved 3/14   SENSORY: Normal to touch all 4 extremities   Coordination: Normal finger to nose, no tremor, no  dysmetria  Assessment/Plan: 1. Functional deficits which require 3+ hours per day of interdisciplinary therapy in a comprehensive inpatient rehab setting. Physiatrist is providing close team supervision and 24 hour management of active medical problems listed below. Physiatrist and rehab team continue to assess barriers to discharge/monitor patient progress toward functional and medical goals  Care Tool:  Bathing    Body parts bathed by patient: Right arm, Left arm, Chest, Abdomen, Right upper leg, Left upper leg, Right lower leg, Left lower leg, Face   Body parts bathed by helper: Front perineal area, Buttocks     Bathing assist Assist Level: Minimal Assistance - Patient > 75%     Upper Body Dressing/Undressing Upper body dressing   What is the patient wearing?: Pull over shirt, Bra    Upper body assist Assist Level: Moderate Assistance - Patient 50 - 74%    Lower Body Dressing/Undressing Lower body dressing      What is the patient wearing?: Incontinence brief, Pants     Lower body assist Assist for lower body dressing: Maximal Assistance - Patient 25 - 49%     Toileting Toileting    Toileting assist Assist for toileting: Maximal Assistance - Patient 25 - 49%     Transfers Chair/bed  transfer  Transfers assist  Chair/bed transfer activity did not occur: Safety/medical concerns (fatigue, pain)  Chair/bed transfer assist level: Minimal Assistance - Patient > 75%     Locomotion Ambulation   Ambulation assist   Ambulation activity did not occur: Safety/medical concerns (fatigue, pain)  Assist level: Minimal Assistance - Patient > 75% Assistive device: Walker-rolling Max distance: 73ft   Walk 10 feet activity   Assist  Walk 10 feet activity did not occur: Safety/medical concerns (fatigue, pain)        Walk 50 feet activity   Assist Walk 50 feet with 2 turns activity did not occur: Safety/medical concerns (fatigue, pain)         Walk 150 feet  activity   Assist Walk 150 feet activity did not occur: Safety/medical concerns (fatigue, pain)         Walk 10 feet on uneven surface  activity   Assist Walk 10 feet on uneven surfaces activity did not occur: Safety/medical concerns (fatigue, pain)         Wheelchair     Assist Is the patient using a wheelchair?: Yes Type of Wheelchair: Manual Wheelchair activity did not occur: Safety/medical concerns (fatigue, pain)         Wheelchair 50 feet with 2 turns activity    Assist    Wheelchair 50 feet with 2 turns activity did not occur: Safety/medical concerns (fatigue, pain)       Wheelchair 150 feet activity     Assist  Wheelchair 150 feet activity did not occur: Safety/medical concerns (fatigue, pain)       Blood pressure (!) 141/83, pulse 88, temperature (!) 97.5 F (36.4 C), temperature source Oral, resp. rate 18, weight 87.7 kg, SpO2 99%.  Medical Problem List and Plan: 1. Functional deficits secondary to septic shock/debility due to symptomatic chronic sigmoid colon stricture.  Status post diverting loop ostomy 2/28             -patient may shower if ostomy covered             -ELOS/Goals: 12-14, Min A to sup PT/OT             -Grounds pass ordered  Con't CIR PT and OT 2.  Antithrombotics: -DVT/anticoagulation:  Pharmaceutical: Lovenox 40mg  daily             -antiplatelet therapy: N/A  3. Pain Management/chronic pain: Neurontin 300 mg twice daily, Voltaren gel 2 g 3 times daily, Lidoderm patch as directed, MS Contin 15 mg every 12 hours, oxycodone changed to MRIR BID prn, Robaxin as needed  3/8- pain doing much better last 2 days- con't regimen 4. Mood/Behavior/Sleep: Cymbalta 60 mg daily             -antipsychotic agents: N/A 5. Neuropsych/cognition: This patient is capable of making decisions on her own behalf.  6. Skin/Wound Care: Routine skin checks  7. Fluids/Electrolytes/Nutrition: Routine in and outs with follow-up chemistries              Hypokalemia- Recheck labs  3/8- K+ up to 3.8  -08/01/23 labs yesterday stable, monitor weekly  8.  Hypotension-resolved/now HTN.  Midodrine discontinued.  Toprol-XL 50 mg daily, spironolactone 12.5mg  daily.  Home Benicar/hydralazine on hold resumed as needed.   -07/26/23 BPs a bit higher today but have been sporadically normal; monitor trend but may need to restart meds -08/01/23 BPs a bit up but overall stable for last couple days; Metoprolol now at 62.5mg  BID, just changed a couple  days ago; monitor for now -08/02/23 BP stable, continue monitoring Vitals:   07/30/23 1325 07/30/23 2156 07/31/23 0106 07/31/23 0618  BP: 132/75 (!) 154/90 (!) 154/93 (!) 143/81   07/31/23 1945 08/01/23 0532 08/01/23 1001 08/01/23 1536  BP: (!) 142/87 (!) 156/87 (!) 140/79 (!) 148/94   08/01/23 1849 08/01/23 1944 08/01/23 2053 08/02/23 0440  BP: (!) 157/103 (!) 141/89 (!) 141/89 (!) 141/83     9.  Rheumatoid arthritis.  Chronic prednisone 10mg  daily resumed. Leflunomide/Plaquenil both restarted  10.  Hyperlipidemia.  Lipitor 10mg  daily  11.  Diabetes mellitus type 2 with peripheral neuropathy.  Hemoglobin A1c 5.6.  SSI.              -CBGs well controlled  3/8- Cbgs adequate control- con't regimen -07/26/23 CBGs not being done, but had good control before, SSI is also d/c'd so I'll let weekday team decide if this is needed still. D/c'd     12.  Hypothyroidism.  Synthroid daily  13.  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  Chronic oxygen.  3 L at baseline prior to admission.  Prophylactic Zithromax resumed  14.  Chronic diastolic congestive heart failure.  Lasix 40mg  daily. Monitor for any signs of fluid overload. Daily weights  3/8- no weight today- yesterday 97.3 kg which was down  -3/15-16/25 wt stable/down, monitor Filed Weights   07/31/23 0621 08/01/23 0500 08/02/23 0448  Weight: 90.9 kg 88.8 kg 87.7 kg    15.  CKD stage III.  Follow-up chemistries. Last Cr 0.66 3/3  3/8-  Cr 0.66   -07/26/23 Cr 0.64, go to weekly labs-- stable 3/14  16.  Iron deficiency anemia.  Follow-up CBC Monday   17.  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  Status post gamma knife 10/13.  Follow-up outpatient  18.  Morbid Obesity.  BMI 35.34:  d/c feeding supplement  19. HLD. continue atorvastatin 10mg  daily  20. Hemorrhoid: tucks ordered, continue  21. Fatigue: B12 injection ordered 3/5  23. Chest pain: discussed that this has resolved  24. Lower extremity edema: weight stable, continue lasix 40mg  daily  25. HTN: lasix ordered, continue 40mg  daily--see #8 above Weight reviewed and is decreased, continue this dose since having some shortness of breath -increase lopressor to 62.5mg  BID, recheck magnesium and potassium and supplement as needed  26. Colostomy- has diverting loop colostomy- continue colostomy education  27. Tachycardia: increase lopressor to 62.5mg  BID, supplement magnesium on 3/13, resolved, continue this dose  28. Magnesium deficiency: IV magnesium ordered 3/13, lab recheck ordered 3/14-- WNL     LOS: 13 days A FACE TO FACE EVALUATION WAS PERFORMED  [address] 08/02/2023, 9:45 AM

## 2023-08-03 DIAGNOSIS — R5381 Other malaise: Secondary | ICD-10-CM | POA: Diagnosis not present

## 2023-08-03 LAB — CBC WITH DIFFERENTIAL/PLATELET
Abs Immature Granulocytes: 0.01 10*3/uL (ref 0.00–0.07)
Basophils Absolute: 0.1 10*3/uL (ref 0.0–0.1)
Basophils Relative: 1 %
Eosinophils Absolute: 0.2 10*3/uL (ref 0.0–0.5)
Eosinophils Relative: 3 %
HCT: 37.2 % (ref 36.0–46.0)
Hemoglobin: 11.2 g/dL — ABNORMAL LOW (ref 12.0–15.0)
Immature Granulocytes: 0 %
Lymphocytes Relative: 68 %
Lymphs Abs: 5.7 10*3/uL — ABNORMAL HIGH (ref 0.7–4.0)
MCH: 29.7 pg (ref 26.0–34.0)
MCHC: 30.1 g/dL (ref 30.0–36.0)
MCV: 98.7 fL (ref 80.0–100.0)
Monocytes Absolute: 0.7 10*3/uL (ref 0.1–1.0)
Monocytes Relative: 9 %
Neutro Abs: 1.6 10*3/uL — ABNORMAL LOW (ref 1.7–7.7)
Neutrophils Relative %: 19 %
Platelets: 182 10*3/uL (ref 150–400)
RBC: 3.77 MIL/uL — ABNORMAL LOW (ref 3.87–5.11)
RDW: 16.8 % — ABNORMAL HIGH (ref 11.5–15.5)
WBC: 8.4 10*3/uL (ref 4.0–10.5)
nRBC: 0 % (ref 0.0–0.2)

## 2023-08-03 LAB — BASIC METABOLIC PANEL
Anion gap: 5 (ref 5–15)
BUN: 13 mg/dL (ref 8–23)
CO2: 26 mmol/L (ref 22–32)
Calcium: 8.4 mg/dL — ABNORMAL LOW (ref 8.9–10.3)
Chloride: 109 mmol/L (ref 98–111)
Creatinine, Ser: 0.82 mg/dL (ref 0.44–1.00)
GFR, Estimated: >60 mL/min (ref >60)
Glucose, Bld: 90 mg/dL (ref 70–99)
Potassium: 4.3 mmol/L (ref 3.5–5.1)
Sodium: 140 mmol/L (ref 135–145)

## 2023-08-03 LAB — MAGNESIUM: Magnesium: 2 mg/dL (ref 1.7–2.4)

## 2023-08-03 MED ORDER — ONDANSETRON HCL 4 MG PO TABS
4.0000 mg | ORAL_TABLET | Freq: Three times a day (TID) | ORAL | Status: DC | PRN
  Administered 2023-08-03: 4 mg via ORAL
  Filled 2023-08-03: qty 1

## 2023-08-03 NOTE — Progress Notes (Signed)
 Physical Therapy Session Note  Patient Details  Name: [name] MRN: [id number] Date of Birth: [birth year]  Today's Date: 08/03/2023 PT Individual Time: 0807-0850 PT Individual Time Calculation (min): 43 min   Short Term Goals: Week 1:  PT Short Term Goal 1 (Week 1): pt will perform bed mobility with min A consistantly PT Short Term Goal 1 - Progress (Week 1): Progressing toward goal PT Short Term Goal 2 (Week 1): pt will transfer bed<>chair with LRAD and CGA PT Short Term Goal 2 - Progress (Week 1): Progressing toward goal PT Short Term Goal 3 (Week 1): pt will ambulate 68ft with LRAD and CGA PT Short Term Goal 3 - Progress (Week 1): Progressing toward goal Week 2:  PT Short Term Goal 1 (Week 2): STG=LTG due to LOS  Skilled Therapeutic Interventions/Progress Updates:  Patient supine in bed on entrance to room. Patient alert and agreeable to PT session. Relates fatigue d/t having JUST returned from toileting in bathroom.   Patient with no pain complaint at start of session. But does say that with fatigue comes LBP.   Therapeutic Activity: Bed Mobility: Pt performed supine > sit with improved strength and coordination from previous session with pt last week. She is able to bring BLE off EOB and initiate forward scoot to EOB with Bil feet on floor. Requires CGA/ MinA at most. Loses balance to R side with reach outside of BOS. Required Min/ ModA to maintain balance. VC/ tc required for coordinated breathing, increasing trunk and head/ shoulder lean for improved weight shift. Performed dressing while seated EOB and requires modA to manage arms and head for donning new shirt, MaxA for pants. 2 standing bouts required to fully don pants.  Transfers: Pt performed sit<>stand transfers throughout session with momentum build and push from seat after vc. Requires several attempts from bed surface with CGA and Bil extensor push of knees into EOB to assist. Provided vc/ tc for forward lean with  head/ shoulders for appropriate weight shift throughout session. One stand pivot performed with .  Gait Training:  Pt encouraged to ambulate further distance by placing w/c out 10 ft for visual cue of distance required to reach. Pt reaches 9 ft using RW with overall CGA but increasing to light MinA after 5 ft d/t pain, energy depetion, and fatigue. Also c/o increase in back pain. No LE weakness noted by pt. W/c repositioned for pt but able to complete stand pivot with Min/ ModA to control descent to sit. Demonstrated flexed posture throughout ending with Bil forearms on RW hand grips. Provided vc/ tc for upright posture, hold of strength in Bil knees during stance phase.   Pt very fatigued at end of ambulation. Pt repositioned in w/c near bed in order to don O2 per pt's request.   - First vitals taken: pulse 120, BP 97/43 (60), and SpO2 100%.  - Pt resting , provided with water and cues for intermittent deep breaths, and vitals then: pulse 111, BP 132/104 (113), SpO2 100% with pt feeling better.   Patient seated upright in w/c at end of session with brakes locked, belt alarm set, and all needs within reach.   Therapy Documentation Precautions:  Precautions Precautions: Fall Precaution/Restrictions Comments: ostomy, monitor O2 and HR Restrictions Weight Bearing Restrictions Per Provider Order: No  Pain: Pt relates pain in back during ambulation that she says affects her strength. Related pt's request for pain medication to RN. After seated rest, relates 6/10 pain.   Therapy/Group: Individual Therapy  Loel Dubonnet PT, DPT, CSRS 08/03/2023, 10:20 AM

## 2023-08-03 NOTE — Progress Notes (Signed)
 PROGRESS NOTE   Subjective/Complaints: Feeling much better and stronger Discussed that Hgb has increased  Felt nauseous later in the day, Zofran ordered   ROS: +chronic pain- better controlled today, +fatigue, +lower extremity edema- improved, +shortness of breath- stable, +nausea   Objective:   No results found. Recent Labs    08/03/23 0546  WBC 8.4  HGB 11.2*  HCT 37.2  PLT 182     Recent Labs    07/31/23 1509 08/03/23 0546  NA 143 140  K 4.3 4.3  CL 106 109  CO2 25 26  GLUCOSE 162* 90  BUN 14 13  CREATININE 0.99 0.82  CALCIUM 8.5* 8.4*     Intake/Output Summary (Last 24 hours) at 08/03/2023 1027 Last data filed at 08/03/2023 0800 Gross per 24 hour  Intake 328.5 ml  Output 250 ml  Net 78.5 ml        Physical Exam: Vital Signs Blood pressure (!) 150/92, pulse 88, temperature 97.9 F (36.6 C), resp. rate 17, weight 86.1 kg, SpO2 99%.   General: asleep but easily awoken, alert, appropriate, resting in bed;  NAD HENT: conjugate gaze; oropharynx moist CV: regular rate and rhythm; no JVD Pulmonary: CTA B/L; no W/R/R- good air movement GI: soft, NT, ND, (+)BS, ostomy L abdomen with minimal liquidy stool present Psychiatric: appropriate- interactive  PRIOR EXAMS: Neurological: Ox3  Mental Status: AAOx3, memory grossly intact, fund of knowledge appropriate Speech/Languate: Follows commands CRANIAL NERVES: Cranial nerves II through XII grossly intact     MOTOR: RUE: 3/5 Deltoid, 4/5 Biceps, 4/5 Triceps,4/5 Grip LUE: 4/5 Deltoid, 4/5 Biceps, 4/5 Triceps, 4/5 Grip RLE: HF 4-/5, KE 4/5, ADF 4/5, APF 4/5 LLE: HF 4-/5, KE 4/5, ADF 4/5, APF 4/5, stable 3/17   SENSORY: Normal to touch all 4 extremities   Coordination: Normal finger to nose, no tremor, no dysmetria  Assessment/Plan: 1. Functional deficits which require 3+ hours per day of interdisciplinary therapy in a comprehensive inpatient  rehab setting. Physiatrist is providing close team supervision and 24 hour management of active medical problems listed below. Physiatrist and rehab team continue to assess barriers to discharge/monitor patient progress toward functional and medical goals  Care Tool:  Bathing    Body parts bathed by patient: Right arm, Left arm, Chest, Abdomen, Right upper leg, Left upper leg, Right lower leg, Left lower leg, Face   Body parts bathed by helper: Front perineal area, Buttocks     Bathing assist Assist Level: Minimal Assistance - Patient > 75%     Upper Body Dressing/Undressing Upper body dressing   What is the patient wearing?: Pull over shirt, Bra    Upper body assist Assist Level: Moderate Assistance - Patient 50 - 74%    Lower Body Dressing/Undressing Lower body dressing      What is the patient wearing?: Incontinence brief, Pants     Lower body assist Assist for lower body dressing: Maximal Assistance - Patient 25 - 49%     Toileting Toileting    Toileting assist Assist for toileting: Maximal Assistance - Patient 25 - 49%     Transfers Chair/bed transfer  Transfers assist  Chair/bed transfer activity did not occur: Safety/medical  concerns (fatigue, pain)  Chair/bed transfer assist level: Minimal Assistance - Patient > 75%     Locomotion Ambulation   Ambulation assist   Ambulation activity did not occur: Safety/medical concerns (fatigue, pain)  Assist level: Minimal Assistance - Patient > 75% Assistive device: Walker-rolling Max distance: 61ft   Walk 10 feet activity   Assist  Walk 10 feet activity did not occur: Safety/medical concerns (fatigue, pain)        Walk 50 feet activity   Assist Walk 50 feet with 2 turns activity did not occur: Safety/medical concerns (fatigue, pain)         Walk 150 feet activity   Assist Walk 150 feet activity did not occur: Safety/medical concerns (fatigue, pain)         Walk 10 feet on uneven surface   activity   Assist Walk 10 feet on uneven surfaces activity did not occur: Safety/medical concerns (fatigue, pain)         Wheelchair     Assist Is the patient using a wheelchair?: Yes Type of Wheelchair: Manual Wheelchair activity did not occur: Safety/medical concerns (fatigue, pain)         Wheelchair 50 feet with 2 turns activity    Assist    Wheelchair 50 feet with 2 turns activity did not occur: Safety/medical concerns (fatigue, pain)       Wheelchair 150 feet activity     Assist  Wheelchair 150 feet activity did not occur: Safety/medical concerns (fatigue, pain)       Blood pressure (!) 150/92, pulse 88, temperature 97.9 F (36.6 C), resp. rate 17, weight 86.1 kg, SpO2 99%.  Medical Problem List and Plan: 1. Functional deficits secondary to septic shock/debility due to symptomatic chronic sigmoid colon stricture.  Status post diverting loop ostomy 2/28             -patient may shower if ostomy covered             -ELOS/Goals: 12-14, Min A to sup PT/OT             -Grounds pass ordered  Con't CIR PT and OT 2.  Antithrombotics: -DVT/anticoagulation:  Pharmaceutical: Lovenox 40mg  daily             -antiplatelet therapy: N/A  3. Pain Management/chronic pain: Neurontin 300 mg twice daily, Voltaren gel 2 g 3 times daily, Lidoderm patch as directed, MS Contin 15 mg every 12 hours, oxycodone changed to MRIR BID prn, Robaxin as needed  3/8- pain doing much better last 2 days- con't regimen 4. Mood/Behavior/Sleep: Cymbalta 60 mg daily             -antipsychotic agents: N/A 5. Neuropsych/cognition: This patient is capable of making decisions on her own behalf.  6. Skin/Wound Care: Routine skin checks  7. Fluids/Electrolytes/Nutrition: Routine in and outs with follow-up chemistries             Hypokalemia- Recheck labs  3/8- K+ up to 3.8  -08/01/23 labs yesterday stable, monitor weekly  8.  Hypotension-resolved/now HTN.  Midodrine discontinued.   Toprol-XL 50 mg daily, spironolactone 12.5mg  daily.  Home Benicar/hydralazine on hold resumed as needed.   -07/26/23 BPs a bit higher today but have been sporadically normal; monitor trend but may need to restart meds -08/01/23 BPs a bit up but overall stable for last couple days; Metoprolol now at 62.5mg  BID, just changed a couple days ago; monitor for now -08/02/23 BP stable, continue monitoring Vitals:   07/31/23 1945 08/01/23  0532 08/01/23 1001 08/01/23 1536  BP: (!) 142/87 (!) 156/87 (!) 140/79 (!) 148/94   08/01/23 1849 08/01/23 1944 08/01/23 2053 08/02/23 0440  BP: (!) 157/103 (!) 141/89 (!) 141/89 (!) 141/83   08/02/23 1342 08/02/23 1939 08/02/23 2051 08/03/23 0404  BP: 124/78 (!) 158/89 (!) 158/89 (!) 150/92     9.  Rheumatoid arthritis.  Chronic prednisone 10mg  daily resumed. Leflunomide/Plaquenil both restarted  10.  Hyperlipidemia.  Lipitor 10mg  daily  11.  Diabetes mellitus type 2 with peripheral neuropathy.  Hemoglobin A1c 5.6.  SSI.              -CBGs well controlled  3/8- Cbgs adequate control- con't regimen -07/26/23 CBGs not being done, but had good control before, SSI is also d/c'd so I'll let weekday team decide if this is needed still. D/c'd     12.  Hypothyroidism.  Continue Synthroid daily  13.  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  Chronic oxygen.  3 L at baseline prior to admission.  Prophylactic Zithromax resumed  14.  Chronic diastolic congestive heart failure.  Lasix 40mg  daily. Monitor for any signs of fluid overload. Daily weights  Weight is decreased, will continue current dose given continued HTN Filed Weights   08/01/23 0500 08/02/23 0448 08/03/23 0542  Weight: 88.8 kg 87.7 kg 86.1 kg    15.  CKD stage III.  Follow-up chemistries. Last Cr 0.66 3/3  3/8- Cr 0.66   -07/26/23 Cr 0.64, go to weekly labs-- stable 3/14  16.  Iron deficiency anemia.  Follow-up CBC Monday   17.  History of meningioma left sphenoid wing involving  cavernous sinus.  Status post gamma knife 10/13.  Follow-up outpatient  18.  Morbid Obesity.  BMI 35.34:  d/c feeding supplement  19. HLD. continue atorvastatin 10mg  daily  20. Hemorrhoid: tucks ordered, continue  21. Fatigue: B12 injection ordered 3/5  23. Chest pain: discussed that this has resolved  24. Lower extremity edema: weight stable, continue lasix 40mg  daily  25. HTN: lasix ordered, continue 40mg  daily--see #8 above Weight reviewed and is decreased, continue this dose since having some shortness of breath -increase lopressor to 62.5mg  BID, recheck magnesium and potassium and supplement as needed  26. Colostomy- has diverting loop colostomy- conitnue colostomy education  27. Tachycardia: increase lopressor to 62.5mg  BID, supplement magnesium on 3/13, resolved, continue this dose given continued HTN  28. Magnesium deficiency: IV magnesium ordered 3/13, lab recheck ordered 3/14-- WNL, magnesium level added on 3/17     LOS: 14 days A FACE TO FACE EVALUATION WAS PERFORMED  Clint Bolder P Tiearra Colwell 08/03/2023, 10:27 AM

## 2023-08-03 NOTE — Progress Notes (Signed)
 Occupational Therapy Session Note  Patient Details  Name: [name] MRN: [id number] Date of Birth: 11-Jun-1940  Today's Date: 08/03/2023 OT Individual Time: 2130-8657 OT Individual Time Calculation (min): 55 min    Short Term Goals: Week 2:  OT Short Term Goal 1 (Week 2): Pt will complete sit > stand in prep for ADL with CGA using LRAD OT Short Term Goal 2 (Week 2): Pt will complete 1/3 toileting steps with CGA for balance OT Short Term Goal 3 (Week 2): Pt will thread LB clothing with supervision using AE PRN  Skilled Therapeutic Interventions/Progress Updates:  Skilled OT intervention completed with focus on activity tolerance, BUE reaching. Pt received seated perched in stedy with NT finishing toileting needs. Pt initially requesting to return to bed, but was agreeable to "light therapy" in w/c. Nausea reported however no pain. Nurse notified of med request for nausea. OT offered rest breaks and repositioning throughout for pain reduction.   Pt c/o nausea and SOB. SPO2 checked- 94% on RA, HR 130 bpm following toileting and only lowered to 125 bpm after extended rest and throughout session. MD/nurse notified of status.  RLE leg rest noted to be twisting and unsupportive of leg. OT retrieved new one and switched out for proper positioning in w/c. Transported dependently in w/c > gym.   Seated at table top, pt completed the following BUE gentle reaching and grasping activities to promote improved BUE use for BADLs: -placing/retrieving clips from tower. Increased time needed due to difficulty at shoulder, but able to place at lower levels appropriately  Pt remained seated in w/c in gym in prep for therapy dance group, with all needs in reach at end of session.   Therapy Documentation Precautions:  Precautions Precautions: Fall Precaution/Restrictions Comments: ostomy, monitor O2 and HR Restrictions Weight Bearing Restrictions Per Provider Order: No    Therapy/Group:  Individual Therapy  Melvyn Novas, MS, OTR/L  08/03/2023, 2:04 PM

## 2023-08-03 NOTE — Progress Notes (Signed)
 Patient ID: EURA MCCAUSLIN, female   DOB: 12-30-1940, 83 y.o.   MRN: [id number]  SW met with pt in room who reports she would like [facility]. SW will follow-up once there are more updates.   SW spoke with [facility]/Admissions with [facility] to discuss referral. Reports bed offer extended. SW informed pt and her son Lyda Jester, and will have transportation set up.  SW arranged ambulance transport with [facility] to [place] for tomorrow, pick up at 12:30pm.    Cecile Sheerer, MSW, LCSW Office: [phone] Cell: [phone] Fax: [phone]

## 2023-08-03 NOTE — Consult Note (Signed)
 WOC Nurse ostomy follow up I did two attempts to consult the patient today with no success. The pt was schedule by 11:00, but she was not available. Tried again 11:45. Schedule with the bed nurse WOC visit tomorrow morning at 08:00.  Thank-you,  Denyse Amass BSN, RN, [employer], WOC  (Pager: [phone])

## 2023-08-03 NOTE — Progress Notes (Signed)
 Physical Therapy Session Note  Patient Details  Name: [name] MRN: [id number] Date of Birth: 1941/03/21  Today's Date: 08/03/2023 PT Individual Time: 1415-1430 PT Individual Time Calculation (min): 15 min  Today's Date: 08/03/2023 PT Missed Time: 30 Minutes Missed Time Reason: Patient unwilling to participate;Patient ill (Comment)  Short Term Goals: Week 1:  PT Short Term Goal 1 (Week 1): pt will perform bed mobility with min A consistantly PT Short Term Goal 1 - Progress (Week 1): Progressing toward goal PT Short Term Goal 2 (Week 1): pt will transfer bed<>chair with LRAD and CGA PT Short Term Goal 2 - Progress (Week 1): Progressing toward goal PT Short Term Goal 3 (Week 1): pt will ambulate 39ft with LRAD and CGA PT Short Term Goal 3 - Progress (Week 1): Progressing toward goal Week 2:  PT Short Term Goal 1 (Week 2): STG=LTG due to LOS  Skilled Therapeutic Interventions/Progress Updates:   Received pt asleep. Upon wakening, pt denied any pain but reported "not feeling well". Upon further questioning pt reported feeling weak and nauseous this morning and feeling "not right" this afternoon. BP:121/75, SPO2 95% on RA, and HR 91bpm. Per chart review, pt with fall on 3/15. Asked pt to state events leading up to fall and pt reported stand<>pivoting from the [facility] to the bed and didn't back up to bed enough and upon sitting, missed the bed and was lowered to the floor, denying any injury. Pt politely declined participation in therapy this afternoon, requesting to "rest", despite encouragement. Concluded session with pt semi-reclined in bed, needs within reach, and bed alarm on. 30 minutes missed of skilled physical therapy due to feeling ill and unwillingness to participate.   Therapy Documentation Precautions:  Precautions Precautions: Fall Precaution/Restrictions Comments: ostomy, monitor O2 and HR Restrictions Weight Bearing Restrictions Per Provider Order: No  Therapy/Group:  Individual Therapy Marlana Salvage Zaunegger Blima Rich PT, DPT 08/03/2023, 6:46 AM

## 2023-08-03 NOTE — Group Note (Signed)
 Patient Details Name: [name] MRN: [id number] DOB: 06-20-1940 Today's Date: 08/03/2023  Time Calculation: OT Group Time Calculation OT Group Start Time: 1100 OT Group Stop Time: 1200 OT Group Time Calculation (min): 60 min      Group Description: Dance Group: Pt participated in dance group with an emphasis on social interaction, motor planning, increasing overall activity tolerance and bimanual tasks. All songs were selected by group members. Dance moves included AROM of BUE/BLE gross motor movements with an emphasis on building functional endurance.    Individual level documentation: Patient completed group from sitting level. Patientt needed supervision to complete various dance moves with cues for motivation and OT providing visual model.  Patient needed min modifications during group.  Pain: Pain Assessment Pain Scale: 0-10 Pain Score: 6  Pain Location: Back Pain Intervention(s): Medication (See eMAR)  Precautions:  Falls   Clide Deutscher 08/03/2023, 12:17 PM

## 2023-08-03 NOTE — Plan of Care (Signed)
  Problem: Consults Goal: RH GENERAL PATIENT EDUCATION Description: See Patient Education module for education specifics. Outcome: Progressing   Problem: RH BOWEL ELIMINATION Goal: RH STG MANAGE BOWEL WITH ASSISTANCE Description: STG Manage Bowel with min Assistance. Outcome: Progressing   Problem: RH BLADDER ELIMINATION Goal: RH STG MANAGE BLADDER WITH ASSISTANCE Description: STG Manage Bladder With toileting Assistance Outcome: Progressing   Problem: RH SKIN INTEGRITY Goal: RH STG ABLE TO PERFORM INCISION/WOUND CARE W/ASSISTANCE Description: STG Able To Perform Incision/Wound Care With min Assistance. Outcome: Progressing

## 2023-08-04 ENCOUNTER — Other Ambulatory Visit (HOSPITAL_COMMUNITY): Payer: Self-pay

## 2023-08-04 DIAGNOSIS — J9611 Chronic respiratory failure with hypoxia: Secondary | ICD-10-CM | POA: Diagnosis not present

## 2023-08-04 DIAGNOSIS — A419 Sepsis, unspecified organism: Secondary | ICD-10-CM | POA: Diagnosis not present

## 2023-08-04 DIAGNOSIS — R7989 Other specified abnormal findings of blood chemistry: Secondary | ICD-10-CM | POA: Diagnosis not present

## 2023-08-04 DIAGNOSIS — R918 Other nonspecific abnormal finding of lung field: Secondary | ICD-10-CM | POA: Diagnosis not present

## 2023-08-04 DIAGNOSIS — Z9981 Dependence on supplemental oxygen: Secondary | ICD-10-CM | POA: Diagnosis not present

## 2023-08-04 DIAGNOSIS — M069 Rheumatoid arthritis, unspecified: Secondary | ICD-10-CM | POA: Diagnosis not present

## 2023-08-04 DIAGNOSIS — R609 Edema, unspecified: Secondary | ICD-10-CM | POA: Diagnosis not present

## 2023-08-04 DIAGNOSIS — F32A Depression, unspecified: Secondary | ICD-10-CM | POA: Diagnosis not present

## 2023-08-04 DIAGNOSIS — R41841 Cognitive communication deficit: Secondary | ICD-10-CM | POA: Diagnosis not present

## 2023-08-04 DIAGNOSIS — K529 Noninfective gastroenteritis and colitis, unspecified: Secondary | ICD-10-CM | POA: Diagnosis not present

## 2023-08-04 DIAGNOSIS — D849 Immunodeficiency, unspecified: Secondary | ICD-10-CM | POA: Diagnosis not present

## 2023-08-04 DIAGNOSIS — M6282 Rhabdomyolysis: Secondary | ICD-10-CM | POA: Diagnosis not present

## 2023-08-04 DIAGNOSIS — Z7401 Bed confinement status: Secondary | ICD-10-CM | POA: Diagnosis not present

## 2023-08-04 DIAGNOSIS — R531 Weakness: Secondary | ICD-10-CM | POA: Diagnosis not present

## 2023-08-04 DIAGNOSIS — E46 Unspecified protein-calorie malnutrition: Secondary | ICD-10-CM | POA: Diagnosis not present

## 2023-08-04 DIAGNOSIS — I517 Cardiomegaly: Secondary | ICD-10-CM | POA: Diagnosis not present

## 2023-08-04 DIAGNOSIS — N182 Chronic kidney disease, stage 2 (mild): Secondary | ICD-10-CM | POA: Diagnosis not present

## 2023-08-04 DIAGNOSIS — G629 Polyneuropathy, unspecified: Secondary | ICD-10-CM | POA: Diagnosis not present

## 2023-08-04 DIAGNOSIS — Z741 Need for assistance with personal care: Secondary | ICD-10-CM | POA: Diagnosis not present

## 2023-08-04 DIAGNOSIS — M545 Low back pain, unspecified: Secondary | ICD-10-CM | POA: Diagnosis not present

## 2023-08-04 DIAGNOSIS — R6521 Severe sepsis with septic shock: Secondary | ICD-10-CM | POA: Diagnosis not present

## 2023-08-04 DIAGNOSIS — I77819 Aortic ectasia, unspecified site: Secondary | ICD-10-CM | POA: Diagnosis not present

## 2023-08-04 DIAGNOSIS — M6281 Muscle weakness (generalized): Secondary | ICD-10-CM | POA: Diagnosis not present

## 2023-08-04 DIAGNOSIS — G3184 Mild cognitive impairment, so stated: Secondary | ICD-10-CM | POA: Diagnosis not present

## 2023-08-04 DIAGNOSIS — I272 Pulmonary hypertension, unspecified: Secondary | ICD-10-CM | POA: Diagnosis not present

## 2023-08-04 DIAGNOSIS — L89121 Pressure ulcer of left upper back, stage 1: Secondary | ICD-10-CM | POA: Diagnosis not present

## 2023-08-04 DIAGNOSIS — I5032 Chronic diastolic (congestive) heart failure: Secondary | ICD-10-CM | POA: Diagnosis not present

## 2023-08-04 DIAGNOSIS — R0789 Other chest pain: Secondary | ICD-10-CM | POA: Diagnosis not present

## 2023-08-04 DIAGNOSIS — Z933 Colostomy status: Secondary | ICD-10-CM | POA: Diagnosis not present

## 2023-08-04 DIAGNOSIS — Z743 Need for continuous supervision: Secondary | ICD-10-CM | POA: Diagnosis not present

## 2023-08-04 DIAGNOSIS — E1142 Type 2 diabetes mellitus with diabetic polyneuropathy: Secondary | ICD-10-CM | POA: Diagnosis not present

## 2023-08-04 DIAGNOSIS — J440 Chronic obstructive pulmonary disease with acute lower respiratory infection: Secondary | ICD-10-CM | POA: Diagnosis not present

## 2023-08-04 DIAGNOSIS — J8410 Pulmonary fibrosis, unspecified: Secondary | ICD-10-CM | POA: Diagnosis not present

## 2023-08-04 DIAGNOSIS — F4323 Adjustment disorder with mixed anxiety and depressed mood: Secondary | ICD-10-CM | POA: Diagnosis not present

## 2023-08-04 DIAGNOSIS — E038 Other specified hypothyroidism: Secondary | ICD-10-CM | POA: Diagnosis not present

## 2023-08-04 DIAGNOSIS — G8929 Other chronic pain: Secondary | ICD-10-CM | POA: Diagnosis not present

## 2023-08-04 DIAGNOSIS — E119 Type 2 diabetes mellitus without complications: Secondary | ICD-10-CM | POA: Diagnosis not present

## 2023-08-04 DIAGNOSIS — R000 Tachycardia, unspecified: Secondary | ICD-10-CM | POA: Diagnosis not present

## 2023-08-04 DIAGNOSIS — K56699 Other intestinal obstruction unspecified as to partial versus complete obstruction: Secondary | ICD-10-CM | POA: Diagnosis not present

## 2023-08-04 DIAGNOSIS — D696 Thrombocytopenia, unspecified: Secondary | ICD-10-CM | POA: Diagnosis not present

## 2023-08-04 DIAGNOSIS — R079 Chest pain, unspecified: Secondary | ICD-10-CM | POA: Diagnosis not present

## 2023-08-04 DIAGNOSIS — N179 Acute kidney failure, unspecified: Secondary | ICD-10-CM | POA: Diagnosis not present

## 2023-08-04 DIAGNOSIS — J449 Chronic obstructive pulmonary disease, unspecified: Secondary | ICD-10-CM | POA: Diagnosis not present

## 2023-08-04 DIAGNOSIS — E7800 Pure hypercholesterolemia, unspecified: Secondary | ICD-10-CM | POA: Diagnosis not present

## 2023-08-04 DIAGNOSIS — F320 Major depressive disorder, single episode, mild: Secondary | ICD-10-CM | POA: Diagnosis not present

## 2023-08-04 DIAGNOSIS — M6259 Muscle wasting and atrophy, not elsewhere classified, multiple sites: Secondary | ICD-10-CM | POA: Diagnosis not present

## 2023-08-04 DIAGNOSIS — R5381 Other malaise: Secondary | ICD-10-CM | POA: Diagnosis not present

## 2023-08-04 DIAGNOSIS — J189 Pneumonia, unspecified organism: Secondary | ICD-10-CM | POA: Diagnosis not present

## 2023-08-04 DIAGNOSIS — R1319 Other dysphagia: Secondary | ICD-10-CM | POA: Diagnosis not present

## 2023-08-04 DIAGNOSIS — I10 Essential (primary) hypertension: Secondary | ICD-10-CM | POA: Diagnosis not present

## 2023-08-04 DIAGNOSIS — R0609 Other forms of dyspnea: Secondary | ICD-10-CM | POA: Diagnosis not present

## 2023-08-04 DIAGNOSIS — I130 Hypertensive heart and chronic kidney disease with heart failure and stage 1 through stage 4 chronic kidney disease, or unspecified chronic kidney disease: Secondary | ICD-10-CM | POA: Diagnosis not present

## 2023-08-04 DIAGNOSIS — K219 Gastro-esophageal reflux disease without esophagitis: Secondary | ICD-10-CM | POA: Diagnosis not present

## 2023-08-04 DIAGNOSIS — I2489 Other forms of acute ischemic heart disease: Secondary | ICD-10-CM | POA: Diagnosis not present

## 2023-08-04 DIAGNOSIS — I2510 Atherosclerotic heart disease of native coronary artery without angina pectoris: Secondary | ICD-10-CM | POA: Diagnosis not present

## 2023-08-04 DIAGNOSIS — D509 Iron deficiency anemia, unspecified: Secondary | ICD-10-CM | POA: Diagnosis not present

## 2023-08-04 DIAGNOSIS — L89153 Pressure ulcer of sacral region, stage 3: Secondary | ICD-10-CM | POA: Diagnosis not present

## 2023-08-04 DIAGNOSIS — E039 Hypothyroidism, unspecified: Secondary | ICD-10-CM | POA: Diagnosis not present

## 2023-08-04 DIAGNOSIS — Z79890 Hormone replacement therapy (postmenopausal): Secondary | ICD-10-CM | POA: Diagnosis not present

## 2023-08-04 DIAGNOSIS — E785 Hyperlipidemia, unspecified: Secondary | ICD-10-CM | POA: Diagnosis not present

## 2023-08-04 DIAGNOSIS — E1140 Type 2 diabetes mellitus with diabetic neuropathy, unspecified: Secondary | ICD-10-CM | POA: Diagnosis not present

## 2023-08-04 DIAGNOSIS — Z1152 Encounter for screening for COVID-19: Secondary | ICD-10-CM | POA: Diagnosis not present

## 2023-08-04 DIAGNOSIS — R2689 Other abnormalities of gait and mobility: Secondary | ICD-10-CM | POA: Diagnosis not present

## 2023-08-04 DIAGNOSIS — Z433 Encounter for attention to colostomy: Secondary | ICD-10-CM | POA: Diagnosis not present

## 2023-08-04 DIAGNOSIS — Z66 Do not resuscitate: Secondary | ICD-10-CM | POA: Diagnosis not present

## 2023-08-04 DIAGNOSIS — R296 Repeated falls: Secondary | ICD-10-CM | POA: Diagnosis not present

## 2023-08-04 DIAGNOSIS — N183 Chronic kidney disease, stage 3 unspecified: Secondary | ICD-10-CM | POA: Diagnosis not present

## 2023-08-04 DIAGNOSIS — I25119 Atherosclerotic heart disease of native coronary artery with unspecified angina pectoris: Secondary | ICD-10-CM | POA: Diagnosis not present

## 2023-08-04 DIAGNOSIS — Z86711 Personal history of pulmonary embolism: Secondary | ICD-10-CM | POA: Diagnosis not present

## 2023-08-04 DIAGNOSIS — E1122 Type 2 diabetes mellitus with diabetic chronic kidney disease: Secondary | ICD-10-CM | POA: Diagnosis not present

## 2023-08-04 DIAGNOSIS — F419 Anxiety disorder, unspecified: Secondary | ICD-10-CM | POA: Diagnosis not present

## 2023-08-04 MED ORDER — METOPROLOL TARTRATE 25 MG PO TABS: 12.5000 mg | ORAL_TABLET | Freq: Two times a day (BID) | ORAL | Status: DC

## 2023-08-04 MED ORDER — METHOCARBAMOL 500 MG PO TABS: 500.0000 mg | ORAL_TABLET | Freq: Four times a day (QID) | ORAL | Status: DC | PRN

## 2023-08-04 MED ORDER — ACETAMINOPHEN 650 MG RE SUPP
650.0000 mg | Freq: Three times a day (TID) | RECTAL | 0 refills | Status: DC
  Filled 2023-08-04: qty 12, 4d supply, fill #0

## 2023-08-04 MED ORDER — GABAPENTIN 300 MG PO CAPS: 300.0000 mg | ORAL_CAPSULE | Freq: Two times a day (BID) | ORAL | Status: DC

## 2023-08-04 MED ORDER — POTASSIUM CHLORIDE CRYS ER 20 MEQ PO TBCR: 40.0000 meq | EXTENDED_RELEASE_TABLET | Freq: Two times a day (BID) | ORAL | Status: DC

## 2023-08-04 MED ORDER — METOPROLOL TARTRATE 12.5 MG HALF TABLET: 12.5000 mg | ORAL_TABLET | Freq: Two times a day (BID) | ORAL | Status: DC

## 2023-08-04 MED ORDER — NITROGLYCERIN 0.4 MG SL SUBL: 0.4000 mg | SUBLINGUAL_TABLET | SUBLINGUAL | Status: AC | PRN

## 2023-08-04 MED ORDER — MORPHINE SULFATE ER 15 MG PO TBCR: 15.0000 mg | EXTENDED_RELEASE_TABLET | Freq: Two times a day (BID) | ORAL | 0 refills | Status: DC

## 2023-08-04 MED ORDER — MORPHINE SULFATE 15 MG PO TABS: 15.0000 mg | ORAL_TABLET | Freq: Two times a day (BID) | ORAL | 0 refills | Status: DC | PRN

## 2023-08-04 MED ORDER — METOPROLOL TARTRATE 50 MG PO TABS: 50.0000 mg | ORAL_TABLET | Freq: Two times a day (BID) | ORAL | Status: DC

## 2023-08-04 MED ORDER — MAGNESIUM GLUCONATE 500 MG PO TABS
250.0000 mg | ORAL_TABLET | Freq: Every day | ORAL | Status: DC
Start: 2023-08-04 — End: 2023-08-12

## 2023-08-04 MED ORDER — DULOXETINE HCL 60 MG PO CPEP: 60.0000 mg | ORAL_CAPSULE | Freq: Every day | ORAL | 0 refills | Status: DC

## 2023-08-04 MED ORDER — LIDOCAINE 5 % EX PTCH: 1.0000 | MEDICATED_PATCH | CUTANEOUS | 0 refills | Status: DC

## 2023-08-04 MED ORDER — AZITHROMYCIN 250 MG PO TABS: ORAL_TABLET | ORAL | Status: DC

## 2023-08-04 MED ORDER — SPIRONOLACTONE 25 MG PO TABS: 12.5000 mg | ORAL_TABLET | Freq: Every day | ORAL | Status: DC

## 2023-08-04 MED ORDER — FUROSEMIDE 40 MG PO TABS: 40.0000 mg | ORAL_TABLET | Freq: Every day | ORAL | Status: DC

## 2023-08-04 MED ORDER — GUAIFENESIN ER 600 MG PO TB12
1200.0000 mg | ORAL_TABLET | Freq: Two times a day (BID) | ORAL | Status: AC
Start: 2023-08-04 — End: ?

## 2023-08-04 MED ORDER — METOPROLOL TARTRATE 75 MG PO TABS
62.5000 mg | ORAL_TABLET | Freq: Two times a day (BID) | ORAL | Status: DC
Start: 2023-08-04 — End: 2023-08-04

## 2023-08-04 MED ORDER — DICLOFENAC SODIUM 1 % EX GEL: 2.0000 g | Freq: Three times a day (TID) | CUTANEOUS | Status: AC

## 2023-08-04 NOTE — Progress Notes (Signed)
 Occupational Therapy Session Note  Patient Details  Name: [name] MRN: [id number] Date of Birth: Jan 07, 1941  Today's Date: 08/04/2023 OT Individual Time: 1105-1200 OT Individual Time Calculation (min): 55 min    Short Term Goals: Week 2:  OT Short Term Goal 1 (Week 2): Pt will complete sit > stand in prep for ADL with CGA using LRAD OT Short Term Goal 2 (Week 2): Pt will complete 1/3 toileting steps with CGA for balance OT Short Term Goal 3 (Week 2): Pt will thread LB clothing with supervision using AE PRN  Skilled Therapeutic Interventions/Progress Updates:  Skilled OT intervention completed with focus on activity tolerance, dynamic standing balance, blocked sit > stands, BUE AROM. Pt received seated in w/c, agreeable to session. Bilateral shoulder and low back pain reported; pre-medicated. OT offered rest breaks and repositioning throughout for pain reduction.  Pt remained on RA, with SPO2 97% and HR at 100 bpm at rest. Pt required extensive rest breaks seated due to global deconditioning and gross body RA joint pain.   Pt declined self-care needs. Transported dependently in w/c <> gym. Completed BIMS cognitive assessment with slight increase from 8 to 12/15 as compared to in the afternoons and anticipate pt may have some sundowning tendencies related to dementia diagnosis. Updated pt that she will be transferring to SNF today  Pt participated in the following dynamic standing balance and endurance tasks to promote independence and safety during BADLs and functional mobility: -cornhole toss activity. CGA/min A sit > stand with RW, and Min A needed for balance. No formal LOB however pt consistently sat prematurely with cues needed for safe reaching back to w/c prior to sitting and for alerting therapist before plopping back down. Pt unable to remain standing for 1 duration on each hand, requiring intermittent seated breaks before continuing. Completed 1 round seated with board  extremely close with improved ability due to bilateral hand and shoulder ROM deficits  Back in room, pt remained seated in w/c, with belt alarm on/activated, and with all needs in reach at end of session.   Therapy Documentation Precautions:  Precautions Precautions: Fall Precaution/Restrictions Comments: ostomy, monitor O2 and HR Restrictions Weight Bearing Restrictions Per Provider Order: No    Therapy/Group: Individual Therapy  Melvyn Novas, MS, OTR/L  08/04/2023, 12:05 PM

## 2023-08-04 NOTE — Progress Notes (Signed)
 Occupational Therapy Discharge Summary  Patient Details  Name: [name] MRN: [id number] Date of Birth: July 10, 1940  Date of Discharge from OT service:August 04, 2023   Patient has met 0 of 8 long term goals due to improved activity tolerance, improved balance, and postural control.  Patient to discharge at overall Min-Mod A level.  Patient's care partner unavailable to provide the necessary physical and cognitive assistance at discharge. Pt to DC to SNF setting.  Reasons goals not met: Goals not met due to cognitive deficits affecting carryover, along with dynamic standing balance/GM coordination deficits and high pain levels in major joints (back, shoulders, knees), requiring increased assist  Recommendation:  Patient will benefit from ongoing skilled OT services in skilled nursing facility setting to continue to advance functional skills in the area of BADL and Reduce care partner burden.  Equipment: No equipment provided  Reasons for discharge: lack of progress toward goals and discharge from hospital  Patient/family agrees with progress made and goals achieved: Yes  OT Discharge Precautions/Restrictions  Precautions Precautions: Fall Precaution/Restrictions Comments: ostomy, monitor O2 and HR Restrictions Weight Bearing Restrictions Per Provider Order: No ADL ADL Equipment Provided: Long-handled sponge Eating: Set up Where Assessed-Eating: Wheelchair Grooming: Setup Where Assessed-Grooming: Sitting at sink Upper Body Bathing: Supervision/safety Where Assessed-Upper Body Bathing: Shower Lower Body Bathing: Minimal assistance Where Assessed-Lower Body Bathing: Shower Upper Body Dressing: Minimal assistance Where Assessed-Upper Body Dressing: Wheelchair Lower Body Dressing: Moderate assistance Where Assessed-Lower Body Dressing: Sitting at sink, Standing at sink Toileting: Maximal assistance Where Assessed-Toileting: Teacher, adult education: Archivist Method: Surveyor, minerals: Raised toilet seat, Grab bars, Other (comment) (RW) Tub/Shower Transfer: Unable to assess Tub/Shower Transfer Method: Unable to assess Film/video editor: Unable to assess Film/video editor Method: Unable to assess Vision Baseline Vision/History: 1 Wears glasses Patient Visual Report: No change from baseline Vision Assessment?: No apparent visual deficits Perception  Perception: Within Functional Limits Praxis Praxis: WFL Cognition Cognition Overall Cognitive Status: History of cognitive impairments - at baseline Arousal/Alertness: Awake/alert Orientation Level: Person;Place;Situation Person: Oriented Place: Oriented Situation: Oriented Memory: Impaired Awareness: Impaired Problem Solving: Impaired Safety/Judgment: Appears intact Comments: dementia at baseline Brief Interview for Mental Status (BIMS) Repetition of Three Words (First Attempt): 3 Temporal Orientation: Year: Missed by 1 year Temporal Orientation: Month: Missed by 6 days to 1 month Temporal Orientation: Day: Incorrect Recall: "Sock": Yes, no cue required Recall: "Blue": Yes, no cue required Recall: "Bed": Yes, no cue required BIMS Summary Score: 12 Sensation Sensation Light Touch: Impaired by gross assessment Hot/Cold: Not tested Proprioception: Appears Intact Stereognosis: Not tested Additional Comments: pt reports numbness/tingling/burning in BLE "sometimes". Pt reports sensation L vs R feels "different" but unable to explain. Pt alos with difficulty with word finding naming anatomical parts of LEs as therapist assessed light touch. Coordination Gross Motor Movements are Fluid and Coordinated: No Fine Motor Movements are Fluid and Coordinated: No Coordination and Movement Description: global weakness/deconditioning, peripheral neuropathy, RA, and dementia Finger Nose Finger Test: impaired RUE>LUE due to RA Heel Shin  Test: decreased strength and ROM Motor  Motor Motor: Within Functional Limits Motor - Skilled Clinical Observations: global weakness/deconditioning, peripheral neuropathy Mobility  Bed Mobility Bed Mobility: Rolling Right;Rolling Left;Sit to Supine;Supine to Sit Rolling Right: Independent with assistive device Rolling Left: Independent with assistive device Supine to Sit: Independent with assistive device Sit to Supine: Moderate Assistance - Patient 50-74% Transfers Sit to Stand: Contact Guard/Touching assist Stand to Sit: Supervision/Verbal cueing  Trunk/Postural Assessment  Cervical Assessment Cervical Assessment: Exceptions to [facility] (forward head) Thoracic Assessment Thoracic Assessment: Exceptions to [facility] (kyphosis) Lumbar Assessment Lumbar Assessment: Exceptions to [facility] (posterior pelvic tilt) Postural Control Postural Control: Deficits on evaluation Trunk Control: flexed trunk in standing, worsening with fatigue Protective Responses: delayed  Balance Balance Balance Assessed: Yes Static Sitting Balance Static Sitting - Balance Support: Feet supported;Bilateral upper extremity supported Static Sitting - Level of Assistance: 5: Stand by assistance (supervision) Dynamic Sitting Balance Dynamic Sitting - Balance Support: Feet supported;No upper extremity supported Dynamic Sitting - Level of Assistance: 5: Stand by assistance (supervision) Static Standing Balance Static Standing - Balance Support: Bilateral upper extremity supported;During functional activity Static Standing - Level of Assistance: 5: Stand by assistance (CGA) Dynamic Standing Balance Dynamic Standing - Balance Support: Bilateral upper extremity supported;During functional activity Dynamic Standing - Level of Assistance: 5: Stand by assistance (CGA) Dynamic Standing - Comments: with transfers Extremity/Trunk Assessment RUE Assessment RUE Assessment: Exceptions to [facility] Active Range of Motion (AROM) Comments:  limited to 90 degrees shoulder flexion, WFL distally General Strength Comments: 3-/5 grossly LUE Assessment LUE Assessment: Exceptions to [facility] Active Range of Motion (AROM) Comments: shoulder flexion limited to 50 degrees 2/2 RA related pain, WFL distally General Strength Comments: 2+/5 proximally   Elvi Leventhal E Latoiya Maradiaga, MS, OTR/L  08/04/2023, 12:24 PM

## 2023-08-04 NOTE — Progress Notes (Signed)
 Inpatient Rehabilitation  Medication Review by a Pharmacist  A complete drug regimen review was completed for this patient to identify any potential clinically significant medication issues.   High Risk Drug Classes Is patient taking? Indication by Medication  Antipsychotic No   Anticoagulant No   Antibiotic Yes Azithromycin: Chronic respiratory failure/COPD ILD   Opioid Yes Morphine: pain  Antiplatelet No   Hypoglycemics/insulin No   Vasoactive Medication Yes Metoprolol tartrate: SVT, HTN Spironolactone, lasix- HTN, CHF Nitroglycerin SL - prn chest pain  Chemotherapy No   Other Yes Lipitor: HLD Tylenol, gabapentin, Voltaren gel, Lidoderm: pain Duloxetine: neuropathic pain, mood/behavior Hydroxychloroquine, leflunamide, prednisone-  rheumatoid arthritis Mucinex: mucus thinning, expectorant Levothyroxine: hypothyroid  Magnesium gluconate, Potassium, Vitamin D,MVI - supplement Protonix: GERD Albuterol: SOB/wheezing Robaxin: muscle spasms     Type of Medication Issue Identified Description of Issue Recommendation(s)  Drug Interaction(s) (clinically significant)     Duplicate Therapy     Allergy     No Medication Administration End Date     Incorrect Dose     Additional Drug Therapy Needed     Significant med changes from prior encounter (inform family/care partners about these prior to discharge). Prior to inpatient acute admission meds discontinued: metolazone, simethicone, calcium, Bentyl, lomotil, Linzess, Ferrous sulfate,  Zofran, Flonase, and Miralax.   Acute care admission discharge orders discontinued PTA medication:  allopurinol, colchicine, hydralazine, olmesartan, and  ozempic.     Communicate medication changes with patient/family at discharge.   Other      Clinically significant medication issues were identified that warrant physician communication and completion of prescribed/recommended actions by midnight of the next day:  No  Name of provider  notified for urgent issues identified:   Provider Method of Notification:    Pharmacist comments:   Time spent performing this drug regimen review (minutes): 30   Thank you for allowing pharmacy to be part of this patients care team. Noah Delaine, RPh Clinical Pharmacist  08/04/2023 10:51 AM

## 2023-08-04 NOTE — Progress Notes (Signed)
 PROGRESS NOTE   Subjective/Complaints: On commode, no new issues Has peripheral neuropathy, A1c was 7.4 in 2020, has since improved, discussed Qutenza   ROS: +chronic pain- better controlled today, +fatigue, +lower extremity edema- improved, +shortness of breath- stable, +nausea, +peripheral neuropathy   Objective:   No results found. Recent Labs    08/03/23 0546  WBC 8.4  HGB 11.2*  HCT 37.2  PLT 182     Recent Labs    08/03/23 0546  NA 140  K 4.3  CL 109  CO2 26  GLUCOSE 90  BUN 13  CREATININE 0.82  CALCIUM 8.4*     Intake/Output Summary (Last 24 hours) at 08/04/2023 0944 Last data filed at 08/04/2023 0700 Gross per 24 hour  Intake 250 ml  Output 125 ml  Net 125 ml        Physical Exam: Vital Signs Blood pressure (!) 137/92, pulse 88, temperature (!) 97.4 F (36.3 C), resp. rate 17, weight 86.8 kg, SpO2 100%.   General: asleep but easily awoken, alert, appropriate, resting in bed;  NAD HENT: conjugate gaze; oropharynx moist CV: regular rate and rhythm; no JVD Pulmonary: CTA B/L; no W/R/R- good air movement GI: soft, NT, ND, (+)BS, ostomy L abdomen with minimal liquidy stool present Psychiatric: appropriate- interactive Neurological: Ox3  Mental Status: AAOx3, memory grossly intact, fund of knowledge appropriate Speech/Languate: Follows commands CRANIAL NERVES: Cranial nerves II through XII grossly intact     MOTOR: RUE: 3/5 Deltoid, 4/5 Biceps, 4/5 Triceps,4/5 Grip LUE: 4/5 Deltoid, 4/5 Biceps, 4/5 Triceps, 4/5 Grip RLE: HF 4-/5, KE 4/5, ADF 4/5, APF 4/5 LLE: HF 4-/5, KE 4/5, ADF 4/5, APF 4/5, stable 3/17   SENSORY: Decreased sensation bilateral feet   Coordination: Normal finger to nose, no tremor, no dysmetria  Assessment/Plan: 1. Functional deficits which require 3+ hours per day of interdisciplinary therapy in a comprehensive inpatient rehab setting. Physiatrist is providing  close team supervision and 24 hour management of active medical problems listed below. Physiatrist and rehab team continue to assess barriers to discharge/monitor patient progress toward functional and medical goals  Care Tool:  Bathing    Body parts bathed by patient: Right arm, Left arm, Chest, Abdomen, Right upper leg, Left upper leg, Right lower leg, Left lower leg, Face   Body parts bathed by helper: Front perineal area, Buttocks     Bathing assist Assist Level: Minimal Assistance - Patient > 75%     Upper Body Dressing/Undressing Upper body dressing   What is the patient wearing?: Pull over shirt    Upper body assist Assist Level: Minimal Assistance - Patient > 75%    Lower Body Dressing/Undressing Lower body dressing      What is the patient wearing?: Incontinence brief, Pants     Lower body assist Assist for lower body dressing: Maximal Assistance - Patient 25 - 49%     Toileting Toileting    Toileting assist Assist for toileting: Maximal Assistance - Patient 25 - 49%     Transfers Chair/bed transfer  Transfers assist  Chair/bed transfer activity did not occur: Safety/medical concerns (fatigue, pain)  Chair/bed transfer assist level: Minimal Assistance - Patient > 75%  Locomotion Ambulation   Ambulation assist   Ambulation activity did not occur: Safety/medical concerns (fatigue, pain)  Assist level: Minimal Assistance - Patient > 75% Assistive device: Walker-rolling Max distance: 55ft   Walk 10 feet activity   Assist  Walk 10 feet activity did not occur: Safety/medical concerns (fatigue, pain)        Walk 50 feet activity   Assist Walk 50 feet with 2 turns activity did not occur: Safety/medical concerns (fatigue, pain)         Walk 150 feet activity   Assist Walk 150 feet activity did not occur: Safety/medical concerns (fatigue, pain)         Walk 10 feet on uneven surface  activity   Assist Walk 10 feet on uneven  surfaces activity did not occur: Safety/medical concerns (fatigue, pain)         Wheelchair     Assist Is the patient using a wheelchair?: Yes Type of Wheelchair: Manual Wheelchair activity did not occur: Safety/medical concerns (fatigue, pain)         Wheelchair 50 feet with 2 turns activity    Assist    Wheelchair 50 feet with 2 turns activity did not occur: Safety/medical concerns (fatigue, pain)       Wheelchair 150 feet activity     Assist  Wheelchair 150 feet activity did not occur: Safety/medical concerns (fatigue, pain)       Blood pressure (!) 137/92, pulse 88, temperature (!) 97.4 F (36.3 C), resp. rate 17, weight 86.8 kg, SpO2 100%.  Medical Problem List and Plan: 1. Functional deficits secondary to septic shock/debility due to symptomatic chronic sigmoid colon stricture.  Status post diverting loop ostomy 2/28             -patient may shower if ostomy covered             -ELOS/Goals: 12-14, Min A to sup PT/OT             -Grounds pass ordered  D/c to SNF 2.  Antithrombotics: -DVT/anticoagulation:  Pharmaceutical: continue Lovenox 40mg  daily             -antiplatelet therapy: N/A  3. Pain Management/chronic pain: Neurontin 300 mg twice daily, Voltaren gel 2 g 3 times daily, Lidoderm patch as directed, MS Contin 15 mg every 12 hours, oxycodone changed to MRIR BID prn, Robaxin as needed  3/8- pain doing much better last 2 days- con't regimen  Will schedule outpatient Qutenza for diabetic peripheral neuropathy  4. Depression: continue Cymbalta 60 mg daily             -antipsychotic agents: N/A 5. Neuropsych/cognition: This patient is capable of making decisions on her own behalf.  6. Skin/Wound Care: Routine skin checks  7. Fluids/Electrolytes/Nutrition: Routine in and outs with follow-up chemistries             Hypokalemia- Recheck labs  3/8- K+ up to 3.8  -08/01/23 labs yesterday stable, monitor weekly  8.  Hypotension-resolved/now HTN.   Midodrine discontinued.  Toprol-XL 50 mg daily, spironolactone 12.5mg  daily.  Home Benicar/hydralazine on hold resumed as needed.   -07/26/23 BPs a bit higher today but have been sporadically normal; monitor trend but may need to restart meds -08/01/23 BPs a bit up but overall stable for last couple days; Metoprolol now at 62.5mg  BID, just changed a couple days ago; monitor for now -08/02/23 BP stable, continue monitoring Vitals:   08/01/23 1849 08/01/23 1944 08/01/23 2053 08/02/23 0440  BP: [name])  157/103 (!) 141/89 (!) 141/89 (!) 141/83   08/02/23 1342 08/02/23 1939 08/02/23 2051 08/03/23 0404  BP: 124/78 (!) 158/89 (!) 158/89 (!) 150/92   08/03/23 1503 08/03/23 2013 08/03/23 2123 08/04/23 0449  BP: 124/85 136/84 123/82 (!) 137/92     9.  Rheumatoid arthritis.  Chronic prednisone 10mg  daily resumed. Leflunomide/Plaquenil both restarted  10.  Hyperlipidemia.  Lipitor 10mg  daily  11.  Diabetes mellitus type 2 with peripheral neuropathy.  Hemoglobin A1c 5.6.  SSI.              -CBGs well controlled  3/8- Cbgs adequate control- con't regimen -07/26/23 CBGs not being done, but had good control before, SSI is also d/c'd so I'll let weekday team decide if this is needed still. D/c'd     12.  Hypothyroidism.  Continue Synthroid daily  13.  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  Chronic oxygen.  3 L at baseline prior to admission.  Prophylactic Zithromax resumed  14.  Chronic diastolic congestive heart failure.  Lasix 40mg  daily. Monitor for any signs of fluid overload. Daily weights  Weight is decreased, will continue current dose given continued HTN Filed Weights   08/02/23 0448 08/03/23 0542 08/04/23 0427  Weight: 87.7 kg 86.1 kg 86.8 kg    15.  CKD stage III.  Follow-up chemistries. Last Cr 0.66 3/3  3/8- Cr 0.66   -07/26/23 Cr 0.64, go to weekly labs-- stable 3/14  16.  Iron deficiency anemia.  Follow-up CBC Monday   17.  History of meningioma left sphenoid wing  involving cavernous sinus.  Status post gamma knife 10/13.  Follow-up outpatient  18.  Morbid Obesity.  BMI 35.34:  d/c feeding supplement  19. HLD. continue atorvastatin 10mg  daily  20. Hemorrhoid: tucks ordered, continue  21. Fatigue: B12 injection ordered 3/5  23. Chest pain: discussed that this has resolved  24. Lower extremity edema: weight stable, continue lasix 40mg  daily  25. HTN: lasix ordered, continue 40mg  daily--see #8 above Weight reviewed and is decreased, continue this dose since having some shortness of breath -increase lopressor to 62.5mg  BID, recheck magnesium and potassium and supplement as needed  26. Colostomy- has diverting loop colostomy- conitnue colostomy education  27. Tachycardia: increase lopressor to 62.5mg  BID, supplement magnesium on 3/13, resolved, continue this dose given continued HTN  28. Magnesium deficiency: IV magnesium ordered 3/13, lab recheck ordered 3/14-- WNL, magnesium level added on 3/17 and continues to be stable   >30 minutes spent in discharge of patient including review of medications and follow-up appointments, physical examination, and in answering all patient's questions      LOS: 15 days A FACE TO FACE EVALUATION WAS PERFORMED  Drema Pry Ganesh Deeg 08/04/2023, 9:44 AM

## 2023-08-04 NOTE — Plan of Care (Signed)
  Problem: Consults Goal: RH GENERAL PATIENT EDUCATION Description: See Patient Education module for education specifics. Outcome: Progressing   Problem: RH BOWEL ELIMINATION Goal: RH STG MANAGE BOWEL WITH ASSISTANCE Description: STG Manage Bowel with min Assistance. Outcome: Progressing   Problem: RH BLADDER ELIMINATION Goal: RH STG MANAGE BLADDER WITH ASSISTANCE Description: STG Manage Bladder With toileting Assistance Outcome: Progressing   Problem: RH SKIN INTEGRITY Goal: RH STG ABLE TO PERFORM INCISION/WOUND CARE W/ASSISTANCE Description: STG Able To Perform Incision/Wound Care With min Assistance. Outcome: Progressing   Problem: RH SAFETY Goal: RH STG ADHERE TO SAFETY PRECAUTIONS W/ASSISTANCE/DEVICE Description: STG Adhere to Safety Precautions With cues Assistance/Device. Outcome: Progressing

## 2023-08-04 NOTE — Progress Notes (Signed)
 Physical Therapy Discharge Summary  Patient Details  Name: [name] MRN: [id number] Date of Birth: 10-24-1940  Date of Discharge from PT service:August 04, 2023  Patient has met 1 of 9 long term goals due to improved activity tolerance, improved balance, and increased strength. Patient to discharge at a wheelchair level Min Assist using RW. Patient's care partner unavailable to provide the necessary physical and cognitive assistance at discharge, therefore plan for pt to discharge to SNF.   Reasons goals not met: Goals not met due to global weakness/deconditioning, fatigue, intermittent pain, baseline dementia, decreased activity tolerance, and decreased motivation for therapy.   Recommendation:  Patient will benefit from ongoing skilled PT services in skilled nursing facility setting to continue to advance safe functional mobility, address ongoing impairments in transfers, generalized strengthening and endurance, dynamic standing balance/coordination, gait training, and to minimize fall risk.  Equipment: No equipment provided - to be provided at SNF  Reasons for discharge: lack of progress toward goals, treatment goals met, and discharge from hospital  Patient/family agrees with progress made and goals achieved: Yes  PT Discharge Precautions/Restrictions Precautions Precautions: Fall Precaution/Restrictions Comments: ostomy, monitor O2 and HR Restrictions Weight Bearing Restrictions Per Provider Order: No Pain Interference Pain Interference Pain Effect on Sleep: 1. Rarely or not at all (unsure of accurracy of answers) Pain Interference with Therapy Activities: 4. Almost constantly (unsure of accurracy of answers) Pain Interference with Day-to-Day Activities: 2. Occasionally (unsure of accurracy of answers) Cognition Overall Cognitive Status: History of cognitive impairments - at baseline Arousal/Alertness: Awake/alert Orientation Level: Oriented X4 Memory:  Impaired Awareness: Impaired Problem Solving: Impaired Safety/Judgment: Appears intact Comments: dementia at baseline Sensation Sensation Light Touch: Impaired by gross assessment Hot/Cold: Not tested Proprioception: Appears Intact Stereognosis: Not tested Additional Comments: pt reports numbness/tingling/burning in BLE "sometimes". Pt reports sensation L vs R feels "different" but unable to explain. Pt alos with difficulty with word finding naming anatomical parts of LEs as therapist assessed light touch. Coordination Gross Motor Movements are Fluid and Coordinated: No Fine Motor Movements are Fluid and Coordinated: No Coordination and Movement Description: global weakness/deconditioning, peripheral neuropathy, RA, and dementia Finger Nose Finger Test: impaired RUE>LUE due to RA Heel Shin Test: decreased strength and ROM Motor  Motor Motor: Within Functional Limits Motor - Skilled Clinical Observations: global weakness/deconditioning, peripheral neuropathy  Mobility Bed Mobility Bed Mobility: Rolling Right;Rolling Left;Sit to Supine;Supine to Sit Rolling Right: Independent with assistive device Rolling Left: Independent with assistive device Supine to Sit: Independent with assistive device Sit to Supine: Moderate Assistance - Patient 50-74% Transfers Transfers: Sit to Stand;Stand to Sit;Stand Pivot Transfers Sit to Stand: Contact Guard/Touching assist Stand to Sit: Supervision/Verbal cueing Stand Pivot Transfers: Contact Guard/Touching assist Transfer (Assistive device): Rolling walker Locomotion  Gait Ambulation: Yes Gait Assistance: Minimal Assistance - Patient > 75% Gait Distance (Feet): 9 Feet Assistive device: Rolling walker Gait Assistance Details: Verbal cues for safe use of DME/AE;Verbal cues for gait pattern;Verbal cues for sequencing;Verbal cues for technique;Verbal cues for precautions/safety Gait Assistance Details: verbal cues for hand placement on RW and  upright posture/gaze Gait Gait: Yes Gait Pattern: Impaired Gait Pattern: Step-to pattern;Decreased step length - right;Decreased step length - left;Decreased stride length;Decreased dorsiflexion - right;Decreased dorsiflexion - left;Poor foot clearance - right;Trunk flexed;Poor foot clearance - left;Narrow base of support;Antalgic Gait velocity: decreased Stairs / Additional Locomotion Stairs: No Corporate treasurer: Yes Wheelchair Assistance: Minimal assistance - Patient >75% Wheelchair Propulsion: Both upper extremities Wheelchair Parts Management: Supervision/cueing Distance: 31ft  Trunk/Postural  Assessment  Cervical Assessment Cervical Assessment: Exceptions to [facility] (forward head) Thoracic Assessment Thoracic Assessment: Exceptions to [facility] (kyphosis) Lumbar Assessment Lumbar Assessment: Exceptions to [facility] (posterior pelvic tilt) Postural Control Postural Control: Deficits on evaluation Trunk Control: flexed trunk in standing, worsening with fatigue Protective Responses: delayed  Balance Balance Balance Assessed: Yes Static Sitting Balance Static Sitting - Balance Support: Feet supported;Bilateral upper extremity supported Static Sitting - Level of Assistance: 5: Stand by assistance (supervision) Dynamic Sitting Balance Dynamic Sitting - Balance Support: Feet supported;No upper extremity supported Dynamic Sitting - Level of Assistance: 5: Stand by assistance (supervision) Static Standing Balance Static Standing - Balance Support: Bilateral upper extremity supported;During functional activity Static Standing - Level of Assistance: 5: Stand by assistance (CGA) Dynamic Standing Balance Dynamic Standing - Balance Support: Bilateral upper extremity supported;During functional activity Dynamic Standing - Level of Assistance: 5: Stand by assistance (CGA) Dynamic Standing - Comments: with transfers Extremity Assessment  RLE Assessment RLE Assessment: Exceptions  to [facility] General Strength Comments: tested sitting in WC RLE Strength Right Hip Flexion: 3-/5 Right Hip ABduction: 3-/5 Right Hip ADduction: 3-/5 Right Knee Flexion: 3/5 Right Knee Extension: 3+/5 Right Ankle Dorsiflexion: 3+/5 Right Ankle Plantar Flexion: 4-/5 LLE Assessment LLE Assessment: Exceptions to [facility] General Strength Comments: tested sitting in WC LLE Strength Left Hip Flexion: 3-/5 Left Hip ABduction: 3-/5 Left Hip ADduction: 3-/5 Left Knee Flexion: 3/5 Left Knee Extension: 3+/5 Left Ankle Dorsiflexion: 3+/5 Left Ankle Plantar Flexion: 4-/5   Marlana Salvage Zaunegger Blima Rich PT, DPT 08/04/2023, 7:27 AM

## 2023-08-04 NOTE — Progress Notes (Signed)
 Physical Therapy Note  Patient Details  Name: [name] MRN: [id number] Date of Birth: 24-Nov-1940 Today's Date: 08/04/2023    Pt presents supine in bed.  Pt being D/C'd to SNF soon and refuses therapy at this time.   Lucio Edward 08/04/2023, 2:31 PM

## 2023-08-04 NOTE — Consult Note (Signed)
 WOC Nurse ostomy follow up Stoma type/location: LUQ colostomy Stomal assessment/size: oval 38 mm x 30 mm  Stoma at the level of the skin with full separation on the edges. Covered with powder and barrier ring. Peristomal assessment: Intact. Treatment options for stomal/peristomal skin: 2" ring, barrier film pad. Output 80 ml on the bag at 0800. Liquid brown stool Ostomy pouching: 1pc. [id number], ring [id number], barrier pad [id number] and belt #621. Education provided: Performed the pouch change with no hand on, the pt prefers to look stead. Revised all steps to change her pouch and protect her skin.  - The pouch need to be change twice per week or if is leaking. - Clean the skin with soup and water, or just water. Keep in mind that any product can stay at the skin before apply the next pouch to have a better seal.   - Cut the barrier with the size and shape as the ostomy has. In this case, oval 30 x 38 mm - Take the protect plastic off the barrier. That can be use as a model for the next pouch system. Apply powder on the separation, apply barrier pad on the skin surrounding. The ring will be on the waffle, around the cut.   - Apply on the skin, stretching a little the bottom of the ostomy site to apply correctly. - The warmed skin will interact with the barrier, making it sealed.   - To empty the pouch, need to be several times per day, when becomes 1/3 full. - Teach how to open and close the lock in roll system. - A bag with filter will provide a release the gas without odor, does not need to protect against the water.   Enrolled patient in [employer] Discharge program: Yes  WOC team will follow further to provide education instructions.  Please reconsult if further assistance is needed. Thank-you,  Denyse Amass BSN, RN, [employer], WOC  (Pager: [phone])

## 2023-08-04 NOTE — Plan of Care (Signed)
  Problem: RH Balance Goal: LTG Patient will maintain dynamic standing with ADLs (OT) Description: LTG:  Patient will maintain dynamic standing balance with assist during activities of daily living (OT)  Outcome: Not Met (add Reason) Flowsheets (Taken 08/04/2023 1208) LTG: Pt will maintain dynamic standing balance during ADLs with: (not met due to cognitive deficits limiting carryover, high pain levels, and dynamic standing balance deficits) --   Problem: Sit to Stand Goal: LTG:  Patient will perform sit to stand in prep for activites of daily living with assistance level (OT) Description: LTG:  Patient will perform sit to stand in prep for activites of daily living with assistance level (OT) Outcome: Not Met (add Reason) Flowsheets (Taken 08/04/2023 1208) LTG: PT will perform sit to stand in prep for activites of daily living with assistance level: (not met due to cognitive deficits limiting carryover, high pain levels, and dynamic standing balance deficits) --   Problem: RH Bathing Goal: LTG Patient will bathe all body parts with assist levels (OT) Description: LTG: Patient will bathe all body parts with assist levels (OT) Outcome: Not Met (add Reason) Flowsheets (Taken 08/04/2023 1208) LTG: Pt will perform bathing with assistance level/cueing: (not met due to cognitive deficits limiting carryover, high pain levels, and dynamic standing balance deficits) --   Problem: RH Dressing Goal: LTG Patient will perform upper body dressing (OT) Description: LTG Patient will perform upper body dressing with assist, with/without cues (OT). Outcome: Not Met (add Reason) Flowsheets (Taken 08/04/2023 1208) LTG: Pt will perform upper body dressing with assistance level of: (not met due to cognitive deficits limiting carryover, high pain levels, and dynamic standing balance deficits) -- Goal: LTG Patient will perform lower body dressing w/assist (OT) Description: LTG: Patient will perform lower body  dressing with assist, with/without cues in positioning using equipment (OT) Outcome: Not Met (add Reason) Flowsheets (Taken 08/04/2023 1208) LTG: Pt will perform lower body dressing with assistance level of: (not met due to cognitive deficits limiting carryover, high pain levels, and dynamic standing balance deficits) --   Problem: RH Toileting Goal: LTG Patient will perform toileting task (3/3 steps) with assistance level (OT) Description: LTG: Patient will perform toileting task (3/3 steps) with assistance level (OT)  Outcome: Not Met (add Reason) Flowsheets (Taken 08/04/2023 1208) LTG: Pt will perform toileting task (3/3 steps) with assistance level: (not met due to cognitive deficits limiting carryover, high pain levels, and dynamic standing balance deficits) --   Problem: RH Toilet Transfers Goal: LTG Patient will perform toilet transfers w/assist (OT) Description: LTG: Patient will perform toilet transfers with assist, with/without cues using equipment (OT) Outcome: Not Met (add Reason) Flowsheets (Taken 08/04/2023 1208) LTG: Pt will perform toilet transfers with assistance level of: (not met due to cognitive deficits limiting carryover, high pain levels, and dynamic standing balance deficits) --   Problem: RH Tub/Shower Transfers Goal: LTG Patient will perform tub/shower transfers w/assist (OT) Description: LTG: Patient will perform tub/shower transfers with assist, with/without cues using equipment (OT) Outcome: Not Met (add Reason) Flowsheets (Taken 08/04/2023 1208) LTG: Pt will perform tub/shower stall transfers with assistance level of: (not met due to cognitive deficits limiting carryover, high pain levels, and dynamic standing balance deficits) --

## 2023-08-04 NOTE — Progress Notes (Signed)
 Physical Therapy Session Note  Patient Details  Name: [name] MRN: [id number] Date of Birth: 04/03/1941  Today's Date: 08/04/2023 PT Individual Time: 8295-6213 and 1300-1353 PT Individual Time Calculation (min): 26 min and 53 min  Short Term Goals: Week 1:  PT Short Term Goal 1 (Week 1): pt will perform bed mobility with min A consistantly PT Short Term Goal 1 - Progress (Week 1): Progressing toward goal PT Short Term Goal 2 (Week 1): pt will transfer bed<>chair with LRAD and CGA PT Short Term Goal 2 - Progress (Week 1): Progressing toward goal PT Short Term Goal 3 (Week 1): pt will ambulate 78ft with LRAD and CGA PT Short Term Goal 3 - Progress (Week 1): Progressing toward goal Week 2:  PT Short Term Goal 1 (Week 2): STG=LTG due to LOS  Skilled Therapeutic Interventions/Progress Updates:   Treatment Session 1 Received pt semi-reclined in bed reporting feeling "much better" than yesterday. Pt agreeable to PT treatment and did not state pain level during session. Session with emphasis on bed mobility, dressing, functional mobility/transfers, and generalized strengthening and endurance. Pt transferred semi-reclined<>sitting R EOB with HOB elevated and use of bedrails with supervision and increased time. Doffed dirty shirt and donned clean one with mod A. Donned pants sitting EOB with max A, then stood from EOB with RW and min A x 2 trials and dependent to pull pants over hips. Performed stand<>pivot bed<>WC with RW and CGA/light min A. Pt sat in WC at sink and brushed teeth with set up assist. RN arrived to administer medications and concluded session with pt sitting in WC, needs within reach, and seatbelt alarm on.   Treatment Session 2 Received pt on [place] with NT returning to bed - with maximal encouragement pt agreed to transfer to [facility] instead of bed. Stood in [place] with CGA and sat in [facility] with close supervision. Pt reported feeling "hot" after coming out of bathroom. Set pt up at  sink and provided cool washcloth for forehead while pt performed hand hygiene.   Session with emphasis on functional mobility/transfers, generalized strengthening and endurance, and gait training. Pt transported to/from room in [facility] dependently for time management purposes. Stood from [facility] with RW and CGA/min A and ambulated 66ft with RW and min A - limited by fatigue and pt with continued tendency to sit suddenly requiring close WC follow. Pt also tends to rest elbows on RW while walking and with poor carry over with cues for correction. Transferred onto mat via stand<>pivot with RW and min A. Stood from [facility] with RW and CGA x 3 trials and tossed horseshoes using RUE with emphasis on dynamic standing balance and upright posture, however pt still with tendency to flex over RW.  Worked on blocked practice sit<>stands with RW and CGA/light min A 2x5 with emphasis on quad strength - max cues for upright posture/gaze. Of note, on second set pt avoiding coming into full stand and just lifting buttocks from mat, then plopping back down. Upon transferring back to [facility], pt failing to stand tall, resting elbows on RW despite cues, then attempting to sit too early, ultimately requiring mod A from therapist to safely pivot hips into WC. Returned to room and pt requested to lie down. Transferred WC<>bed via stand<>pivot with RW and min A and sit<>supine with mod A for BLE management. Concluded session with pt semi-reclined in bed, needs within reach, and bed alarm on.   Therapy Documentation Precautions:  Precautions Precautions: Fall Precaution/Restrictions Comments: ostomy,  monitor O2 and HR Restrictions Weight Bearing Restrictions Per Provider Order: No  Therapy/Group: Individual Therapy Marlana Salvage Zaunegger Blima Rich PT, DPT 08/04/2023, 6:54 AM

## 2023-08-04 NOTE — Progress Notes (Signed)
 Patient ID: [name], female   DOB: 06/14/1940, 83 y.o.   MRN: [id number]  SW received updaes from attending, pt wanted tp meet with SW.   SW met with pt in room, and she reports she confirms she prefers [facility]. SW will follow-up once there are updates. After SW checked [facility], pt was declined placement. SW  waiting on updates from admissions coord to discuss changes.  5188- SW called pt son Lyda Jester to inform on aboec, and discussed possibly discharge to [facility], and will discuss with his mother. SW me tiwth pt in room to inform on above, and will work on getting an answer.    *SW met with pt in room to inform on still no updates at this time from Admissions team. Pt amenable to discharge to [place] since still no updates from [facility].   [facility] willing to accept pt. Ambulance transport scheduled with [facility] for pick up at 3pm. SW informed pt son Lyda Jester.  Medical team aware of discharge.  Cecile Sheerer, MSW, LCSW Office: [phone] Cell: [phone] Fax: [phone]

## 2023-08-05 DIAGNOSIS — M6281 Muscle weakness (generalized): Secondary | ICD-10-CM | POA: Diagnosis not present

## 2023-08-05 DIAGNOSIS — M069 Rheumatoid arthritis, unspecified: Secondary | ICD-10-CM | POA: Diagnosis not present

## 2023-08-05 DIAGNOSIS — J449 Chronic obstructive pulmonary disease, unspecified: Secondary | ICD-10-CM | POA: Diagnosis not present

## 2023-08-05 DIAGNOSIS — R2689 Other abnormalities of gait and mobility: Secondary | ICD-10-CM | POA: Diagnosis not present

## 2023-08-05 DIAGNOSIS — I5032 Chronic diastolic (congestive) heart failure: Secondary | ICD-10-CM | POA: Diagnosis not present

## 2023-08-05 DIAGNOSIS — Z741 Need for assistance with personal care: Secondary | ICD-10-CM | POA: Diagnosis not present

## 2023-08-05 DIAGNOSIS — G8929 Other chronic pain: Secondary | ICD-10-CM | POA: Diagnosis not present

## 2023-08-05 DIAGNOSIS — J9611 Chronic respiratory failure with hypoxia: Secondary | ICD-10-CM | POA: Diagnosis not present

## 2023-08-05 DIAGNOSIS — E039 Hypothyroidism, unspecified: Secondary | ICD-10-CM | POA: Diagnosis not present

## 2023-08-05 DIAGNOSIS — F32A Depression, unspecified: Secondary | ICD-10-CM | POA: Diagnosis not present

## 2023-08-06 DIAGNOSIS — J8410 Pulmonary fibrosis, unspecified: Secondary | ICD-10-CM | POA: Diagnosis not present

## 2023-08-06 DIAGNOSIS — M069 Rheumatoid arthritis, unspecified: Secondary | ICD-10-CM | POA: Diagnosis not present

## 2023-08-06 DIAGNOSIS — I5032 Chronic diastolic (congestive) heart failure: Secondary | ICD-10-CM | POA: Diagnosis not present

## 2023-08-06 DIAGNOSIS — A419 Sepsis, unspecified organism: Secondary | ICD-10-CM | POA: Diagnosis not present

## 2023-08-06 DIAGNOSIS — K56699 Other intestinal obstruction unspecified as to partial versus complete obstruction: Secondary | ICD-10-CM | POA: Diagnosis not present

## 2023-08-06 DIAGNOSIS — E119 Type 2 diabetes mellitus without complications: Secondary | ICD-10-CM | POA: Diagnosis not present

## 2023-08-06 DIAGNOSIS — E46 Unspecified protein-calorie malnutrition: Secondary | ICD-10-CM | POA: Diagnosis not present

## 2023-08-06 DIAGNOSIS — E039 Hypothyroidism, unspecified: Secondary | ICD-10-CM | POA: Diagnosis not present

## 2023-08-06 NOTE — Progress Notes (Signed)
 Inpatient Rehabilitation Care Coordinator Discharge Note   Patient Details  Name: JERZI TIGERT MRN: [id number] Date of Birth: 1940/06/18   Discharge location: D/c to SNF- [facility]  Length of Stay: 14 days  Discharge activity level: Mod to Max Asst  Home/community participation: Limited  Patient response EX:BMWUXL Literacy - How often do you need to have someone help you when you read instructions, pamphlets, or other written material from your doctor or pharmacy?: Rarely  Patient response KG:MWNUUV Isolation - How often do you feel lonely or isolated from those around you?: Never  Services provided included: RD, MD, PT, OT, TR, Pharmacy, SW, Neuropsych, CM, RN  Financial Services:  Field seismologist Utilized: Medicare    Choices offered to/list presented to: Patient  Follow-up services arranged:              Patient response to transportation need: Is the patient able to respond to transportation needs?: Yes In the past 12 months, has lack of transportation kept you from medical appointments or from getting medications?: No In the past 12 months, has lack of transportation kept you from meetings, work, or from getting things needed for daily living?: No   Patient/Family verbalized understanding of follow-up arrangements:  Yes  Individual responsible for coordination of the follow-up plan: contact pt or pt so Lyda Jester  Confirmed correct DME delivered: Gretchen Short 08/06/2023    Comments (or additional information):  Summary of Stay    Date/Time Discharge Planning CSW  07/30/23 1322 Pt will d/c to home with her son; son works 3rd shift. Pt husband able to provide supervision only. Family is working 24/7 care for when his mother is released. Fam edu on Friay 1pm-4pm.  SW will confirm there are no barriers to discharge. AAC  07/21/23 0908 Pt will d/c to home with her son; son works 3rd shift. Pt husband able to provide supervision only. Family is working  24/7 care for when his mother is released. SW will confirm there are no barriers to discharge. AAC       Shamon Cothran A Lula Olszewski

## 2023-08-12 ENCOUNTER — Other Ambulatory Visit: Payer: Self-pay

## 2023-08-12 ENCOUNTER — Inpatient Hospital Stay (HOSPITAL_COMMUNITY)

## 2023-08-12 ENCOUNTER — Inpatient Hospital Stay (HOSPITAL_COMMUNITY)
Admission: EM | Admit: 2023-08-12 | Discharge: 2023-08-14 | DRG: 193 | Disposition: A | Discharge status: Skilled Nursing Facility | Source: Skilled Nursing Facility | Attending: Internal Medicine | Admitting: Internal Medicine

## 2023-08-12 ENCOUNTER — Encounter (HOSPITAL_COMMUNITY): Payer: Self-pay

## 2023-08-12 ENCOUNTER — Emergency Department (HOSPITAL_COMMUNITY)

## 2023-08-12 DIAGNOSIS — Z9981 Dependence on supplemental oxygen: Secondary | ICD-10-CM

## 2023-08-12 DIAGNOSIS — J440 Chronic obstructive pulmonary disease with acute lower respiratory infection: Secondary | ICD-10-CM | POA: Diagnosis present

## 2023-08-12 DIAGNOSIS — E1122 Type 2 diabetes mellitus with diabetic chronic kidney disease: Secondary | ICD-10-CM | POA: Diagnosis not present

## 2023-08-12 DIAGNOSIS — N179 Acute kidney failure, unspecified: Secondary | ICD-10-CM | POA: Diagnosis present

## 2023-08-12 DIAGNOSIS — E039 Hypothyroidism, unspecified: Secondary | ICD-10-CM | POA: Diagnosis not present

## 2023-08-12 DIAGNOSIS — Z66 Do not resuscitate: Secondary | ICD-10-CM | POA: Diagnosis present

## 2023-08-12 DIAGNOSIS — Z885 Allergy status to narcotic agent status: Secondary | ICD-10-CM

## 2023-08-12 DIAGNOSIS — E1142 Type 2 diabetes mellitus with diabetic polyneuropathy: Secondary | ICD-10-CM | POA: Diagnosis present

## 2023-08-12 DIAGNOSIS — I5032 Chronic diastolic (congestive) heart failure: Secondary | ICD-10-CM | POA: Diagnosis not present

## 2023-08-12 DIAGNOSIS — M6281 Muscle weakness (generalized): Secondary | ICD-10-CM | POA: Diagnosis not present

## 2023-08-12 DIAGNOSIS — I2510 Atherosclerotic heart disease of native coronary artery without angina pectoris: Secondary | ICD-10-CM | POA: Diagnosis not present

## 2023-08-12 DIAGNOSIS — Z1152 Encounter for screening for COVID-19: Secondary | ICD-10-CM

## 2023-08-12 DIAGNOSIS — D509 Iron deficiency anemia, unspecified: Secondary | ICD-10-CM | POA: Diagnosis present

## 2023-08-12 DIAGNOSIS — Z741 Need for assistance with personal care: Secondary | ICD-10-CM | POA: Diagnosis not present

## 2023-08-12 DIAGNOSIS — R000 Tachycardia, unspecified: Secondary | ICD-10-CM | POA: Diagnosis not present

## 2023-08-12 DIAGNOSIS — Z7401 Bed confinement status: Secondary | ICD-10-CM | POA: Diagnosis not present

## 2023-08-12 DIAGNOSIS — G8929 Other chronic pain: Secondary | ICD-10-CM | POA: Diagnosis not present

## 2023-08-12 DIAGNOSIS — Z86011 Personal history of benign neoplasm of the brain: Secondary | ICD-10-CM

## 2023-08-12 DIAGNOSIS — Z888 Allergy status to other drugs, medicaments and biological substances status: Secondary | ICD-10-CM

## 2023-08-12 DIAGNOSIS — I130 Hypertensive heart and chronic kidney disease with heart failure and stage 1 through stage 4 chronic kidney disease, or unspecified chronic kidney disease: Secondary | ICD-10-CM | POA: Diagnosis not present

## 2023-08-12 DIAGNOSIS — E785 Hyperlipidemia, unspecified: Secondary | ICD-10-CM | POA: Diagnosis not present

## 2023-08-12 DIAGNOSIS — Z7952 Long term (current) use of systemic steroids: Secondary | ICD-10-CM

## 2023-08-12 DIAGNOSIS — R531 Weakness: Secondary | ICD-10-CM | POA: Diagnosis not present

## 2023-08-12 DIAGNOSIS — Z841 Family history of disorders of kidney and ureter: Secondary | ICD-10-CM

## 2023-08-12 DIAGNOSIS — Z833 Family history of diabetes mellitus: Secondary | ICD-10-CM

## 2023-08-12 DIAGNOSIS — R609 Edema, unspecified: Secondary | ICD-10-CM

## 2023-08-12 DIAGNOSIS — E038 Other specified hypothyroidism: Secondary | ICD-10-CM

## 2023-08-12 DIAGNOSIS — Z933 Colostomy status: Secondary | ICD-10-CM

## 2023-08-12 DIAGNOSIS — L89121 Pressure ulcer of left upper back, stage 1: Secondary | ICD-10-CM | POA: Diagnosis not present

## 2023-08-12 DIAGNOSIS — R079 Chest pain, unspecified: Secondary | ICD-10-CM | POA: Diagnosis not present

## 2023-08-12 DIAGNOSIS — J449 Chronic obstructive pulmonary disease, unspecified: Secondary | ICD-10-CM | POA: Diagnosis not present

## 2023-08-12 DIAGNOSIS — Z800 Family history of malignant neoplasm of digestive organs: Secondary | ICD-10-CM

## 2023-08-12 DIAGNOSIS — Z803 Family history of malignant neoplasm of breast: Secondary | ICD-10-CM

## 2023-08-12 DIAGNOSIS — I517 Cardiomegaly: Secondary | ICD-10-CM | POA: Diagnosis not present

## 2023-08-12 DIAGNOSIS — Z79890 Hormone replacement therapy (postmenopausal): Secondary | ICD-10-CM

## 2023-08-12 DIAGNOSIS — Z801 Family history of malignant neoplasm of trachea, bronchus and lung: Secondary | ICD-10-CM

## 2023-08-12 DIAGNOSIS — Z86711 Personal history of pulmonary embolism: Secondary | ICD-10-CM

## 2023-08-12 DIAGNOSIS — F32A Depression, unspecified: Secondary | ICD-10-CM | POA: Diagnosis not present

## 2023-08-12 DIAGNOSIS — Z90710 Acquired absence of both cervix and uterus: Secondary | ICD-10-CM

## 2023-08-12 DIAGNOSIS — J189 Pneumonia, unspecified organism: Principal | ICD-10-CM | POA: Diagnosis present

## 2023-08-12 DIAGNOSIS — L89153 Pressure ulcer of sacral region, stage 3: Secondary | ICD-10-CM | POA: Diagnosis present

## 2023-08-12 DIAGNOSIS — Z8049 Family history of malignant neoplasm of other genital organs: Secondary | ICD-10-CM

## 2023-08-12 DIAGNOSIS — Z6833 Body mass index (BMI) 33.0-33.9, adult: Secondary | ICD-10-CM

## 2023-08-12 DIAGNOSIS — J9611 Chronic respiratory failure with hypoxia: Secondary | ICD-10-CM | POA: Diagnosis not present

## 2023-08-12 DIAGNOSIS — Z8051 Family history of malignant neoplasm of kidney: Secondary | ICD-10-CM

## 2023-08-12 DIAGNOSIS — E7800 Pure hypercholesterolemia, unspecified: Secondary | ICD-10-CM | POA: Diagnosis present

## 2023-08-12 DIAGNOSIS — A419 Sepsis, unspecified organism: Secondary | ICD-10-CM | POA: Diagnosis present

## 2023-08-12 DIAGNOSIS — N183 Chronic kidney disease, stage 3 unspecified: Secondary | ICD-10-CM | POA: Diagnosis present

## 2023-08-12 DIAGNOSIS — Z8042 Family history of malignant neoplasm of prostate: Secondary | ICD-10-CM

## 2023-08-12 DIAGNOSIS — Z8249 Family history of ischemic heart disease and other diseases of the circulatory system: Secondary | ICD-10-CM

## 2023-08-12 DIAGNOSIS — R0789 Other chest pain: Secondary | ICD-10-CM | POA: Diagnosis not present

## 2023-08-12 DIAGNOSIS — R7989 Other specified abnormal findings of blood chemistry: Secondary | ICD-10-CM | POA: Diagnosis not present

## 2023-08-12 DIAGNOSIS — R0609 Other forms of dyspnea: Secondary | ICD-10-CM | POA: Diagnosis present

## 2023-08-12 DIAGNOSIS — R6520 Severe sepsis without septic shock: Secondary | ICD-10-CM | POA: Diagnosis present

## 2023-08-12 DIAGNOSIS — R918 Other nonspecific abnormal finding of lung field: Secondary | ICD-10-CM | POA: Diagnosis not present

## 2023-08-12 DIAGNOSIS — M069 Rheumatoid arthritis, unspecified: Secondary | ICD-10-CM | POA: Diagnosis present

## 2023-08-12 DIAGNOSIS — I10 Essential (primary) hypertension: Secondary | ICD-10-CM | POA: Diagnosis not present

## 2023-08-12 DIAGNOSIS — E66811 Obesity, class 1: Secondary | ICD-10-CM | POA: Diagnosis present

## 2023-08-12 DIAGNOSIS — I2489 Other forms of acute ischemic heart disease: Secondary | ICD-10-CM | POA: Diagnosis not present

## 2023-08-12 DIAGNOSIS — I77819 Aortic ectasia, unspecified site: Secondary | ICD-10-CM | POA: Diagnosis not present

## 2023-08-12 DIAGNOSIS — R2689 Other abnormalities of gait and mobility: Secondary | ICD-10-CM | POA: Diagnosis not present

## 2023-08-12 DIAGNOSIS — I444 Left anterior fascicular block: Secondary | ICD-10-CM | POA: Diagnosis present

## 2023-08-12 DIAGNOSIS — Z79899 Other long term (current) drug therapy: Secondary | ICD-10-CM

## 2023-08-12 LAB — CBC
HCT: 48.5 % — ABNORMAL HIGH (ref 36.0–46.0)
HCT: 41.5 % (ref 36.0–46.0)
Hemoglobin: 14.4 g/dL (ref 12.0–15.0)
Hemoglobin: 12.4 g/dL (ref 12.0–15.0)
MCH: 29 pg (ref 26.0–34.0)
MCH: 29.3 pg (ref 26.0–34.0)
MCHC: 29.7 g/dL — ABNORMAL LOW (ref 30.0–36.0)
MCHC: 29.9 g/dL — ABNORMAL LOW (ref 30.0–36.0)
MCV: 97.8 fL (ref 80.0–100.0)
MCV: 98.1 fL (ref 80.0–100.0)
Platelets: 183 10*3/uL (ref 150–400)
Platelets: 189 10*3/uL (ref 150–400)
RBC: 4.96 MIL/uL (ref 3.87–5.11)
RBC: 4.23 MIL/uL (ref 3.87–5.11)
RDW: 16.6 % — ABNORMAL HIGH (ref 11.5–15.5)
RDW: 16.3 % — ABNORMAL HIGH (ref 11.5–15.5)
WBC: 11.4 10*3/uL — ABNORMAL HIGH (ref 4.0–10.5)
WBC: 13.2 10*3/uL — ABNORMAL HIGH (ref 4.0–10.5)
nRBC: 0.2 % (ref 0.0–0.2)
nRBC: 0 % (ref 0.0–0.2)

## 2023-08-12 LAB — RESP PANEL BY RT-PCR (RSV, FLU A&B, COVID)  RVPGX2
Influenza A by PCR: NEGATIVE
Influenza B by PCR: NEGATIVE
Resp Syncytial Virus by PCR: NEGATIVE
SARS Coronavirus 2 by RT PCR: NEGATIVE

## 2023-08-12 LAB — CBG MONITORING, ED: Glucose-Capillary: 118 mg/dL — ABNORMAL HIGH (ref 70–99)

## 2023-08-12 LAB — LIPASE, BLOOD: Lipase: 38 U/L (ref 11–51)

## 2023-08-12 LAB — COMPREHENSIVE METABOLIC PANEL
ALT: 22 U/L (ref 0–44)
AST: 24 U/L (ref 15–41)
Albumin: 3.2 g/dL — ABNORMAL LOW (ref 3.5–5.0)
Alkaline Phosphatase: 84 U/L (ref 38–126)
Anion gap: 13 (ref 5–15)
BUN: 31 mg/dL — ABNORMAL HIGH (ref 8–23)
CO2: 20 mmol/L — ABNORMAL LOW (ref 22–32)
Calcium: 9.1 mg/dL (ref 8.9–10.3)
Chloride: 103 mmol/L (ref 98–111)
Creatinine, Ser: 1.64 mg/dL — ABNORMAL HIGH (ref 0.44–1.00)
GFR, Estimated: 31 mL/min — ABNORMAL LOW (ref >60)
Glucose, Bld: 127 mg/dL — ABNORMAL HIGH (ref 70–99)
Potassium: 4.9 mmol/L (ref 3.5–5.1)
Sodium: 136 mmol/L (ref 135–145)
Total Bilirubin: 0.9 mg/dL (ref 0.0–1.2)
Total Protein: 6.2 g/dL — ABNORMAL LOW (ref 6.5–8.1)

## 2023-08-12 LAB — CREATININE, SERUM
Creatinine, Ser: 1.49 mg/dL — ABNORMAL HIGH (ref 0.44–1.00)
GFR, Estimated: 35 mL/min — ABNORMAL LOW (ref >60)

## 2023-08-12 LAB — SAMPLE TO BLOOD BANK

## 2023-08-12 LAB — I-STAT CG4 LACTIC ACID, ED: Lactic Acid, Venous: 1.6 mmol/L (ref 0.5–1.9)

## 2023-08-12 LAB — TROPONIN I (HIGH SENSITIVITY)
Troponin I (High Sensitivity): 26 ng/L — ABNORMAL HIGH (ref <18)
Troponin I (High Sensitivity): 37 ng/L — ABNORMAL HIGH (ref <18)

## 2023-08-12 LAB — TSH: TSH: 1.229 u[IU]/mL (ref 0.350–4.500)

## 2023-08-12 LAB — GLUCOSE, CAPILLARY: Glucose-Capillary: 139 mg/dL — ABNORMAL HIGH (ref 70–99)

## 2023-08-12 LAB — D-DIMER, QUANTITATIVE: D-Dimer, Quant: 1.44 ug{FEU}/mL — ABNORMAL HIGH (ref 0.00–0.50)

## 2023-08-12 MED ORDER — HEPARIN BOLUS VIA INFUSION
4000.0000 [IU] | Freq: Once | INTRAVENOUS | Status: AC
  Administered 2023-08-12: 4000 [IU] via INTRAVENOUS
  Filled 2023-08-12: qty 4000

## 2023-08-12 MED ORDER — ENOXAPARIN SODIUM 30 MG/0.3ML IJ SOSY: 30.0000 mg | PREFILLED_SYRINGE | INTRAMUSCULAR | Status: DC

## 2023-08-12 MED ORDER — SODIUM CHLORIDE 0.9 % IV SOLN
2.0000 g | INTRAVENOUS | Status: DC
  Administered 2023-08-12 – 2023-08-13 (×2): 2 g via INTRAVENOUS
  Filled 2023-08-12 (×3): qty 20

## 2023-08-12 MED ORDER — ASPIRIN 81 MG PO CHEW: 324.0000 mg | CHEWABLE_TABLET | Freq: Once | ORAL | Status: DC

## 2023-08-12 MED ORDER — ATORVASTATIN CALCIUM 10 MG PO TABS
10.0000 mg | ORAL_TABLET | Freq: Every day | ORAL | Status: DC
  Administered 2023-08-12 – 2023-08-14 (×3): 10 mg via ORAL
  Filled 2023-08-12 (×3): qty 1

## 2023-08-12 MED ORDER — GABAPENTIN 300 MG PO CAPS
300.0000 mg | ORAL_CAPSULE | Freq: Two times a day (BID) | ORAL | Status: DC
  Administered 2023-08-12 – 2023-08-14 (×4): 300 mg via ORAL
  Filled 2023-08-12 (×4): qty 1

## 2023-08-12 MED ORDER — HEPARIN (PORCINE) 25000 UT/250ML-% IV SOLN
1300.0000 [IU]/h | INTRAVENOUS | Status: DC
  Administered 2023-08-12: 1150 [IU]/h via INTRAVENOUS
  Administered 2023-08-13: 1300 [IU]/h via INTRAVENOUS
  Filled 2023-08-12 (×2): qty 250

## 2023-08-12 MED ORDER — INSULIN ASPART 100 UNIT/ML IJ SOLN
0.0000 [IU] | Freq: Three times a day (TID) | INTRAMUSCULAR | Status: DC
  Administered 2023-08-13: 2 [IU] via SUBCUTANEOUS

## 2023-08-12 MED ORDER — MORPHINE SULFATE ER 15 MG PO TBCR
15.0000 mg | EXTENDED_RELEASE_TABLET | Freq: Two times a day (BID) | ORAL | Status: DC
  Administered 2023-08-12 – 2023-08-14 (×4): 15 mg via ORAL
  Filled 2023-08-12 (×4): qty 1

## 2023-08-12 MED ORDER — POLYETHYLENE GLYCOL 3350 17 G PO PACK: 17.0000 g | PACK | Freq: Every day | ORAL | Status: DC | PRN

## 2023-08-12 MED ORDER — VITAMIN D 25 MCG (1000 UNIT) PO TABS
2000.0000 [IU] | ORAL_TABLET | Freq: Every day | ORAL | Status: DC
  Administered 2023-08-12 – 2023-08-14 (×3): 2000 [IU] via ORAL
  Filled 2023-08-12 (×3): qty 2

## 2023-08-12 MED ORDER — PREDNISONE 10 MG PO TABS
10.0000 mg | ORAL_TABLET | Freq: Every day | ORAL | Status: DC
  Administered 2023-08-13 – 2023-08-14 (×2): 10 mg via ORAL
  Filled 2023-08-12 (×2): qty 2

## 2023-08-12 MED ORDER — NITROGLYCERIN 0.4 MG SL SUBL: 0.4000 mg | SUBLINGUAL_TABLET | SUBLINGUAL | Status: DC | PRN

## 2023-08-12 MED ORDER — LEVOTHYROXINE SODIUM 50 MCG PO TABS
50.0000 ug | ORAL_TABLET | Freq: Every day | ORAL | Status: DC
  Administered 2023-08-13 – 2023-08-14 (×2): 50 ug via ORAL
  Filled 2023-08-12 (×2): qty 1

## 2023-08-12 MED ORDER — METOPROLOL TARTRATE 50 MG PO TABS
50.0000 mg | ORAL_TABLET | Freq: Two times a day (BID) | ORAL | Status: DC
  Administered 2023-08-12 – 2023-08-14 (×4): 50 mg via ORAL
  Filled 2023-08-12: qty 1
  Filled 2023-08-12: qty 2
  Filled 2023-08-12 (×2): qty 1

## 2023-08-12 MED ORDER — ACETAMINOPHEN 650 MG RE SUPP: 650.0000 mg | Freq: Four times a day (QID) | RECTAL | Status: DC | PRN

## 2023-08-12 MED ORDER — ACETAMINOPHEN 325 MG PO TABS: 650.0000 mg | ORAL_TABLET | Freq: Four times a day (QID) | ORAL | Status: DC | PRN

## 2023-08-12 MED ORDER — SODIUM CHLORIDE 0.9 % IV BOLUS
500.0000 mL | Freq: Once | INTRAVENOUS | Status: AC
  Administered 2023-08-12: 500 mL via INTRAVENOUS

## 2023-08-12 MED ORDER — FENTANYL CITRATE PF 50 MCG/ML IJ SOSY
50.0000 ug | PREFILLED_SYRINGE | Freq: Once | INTRAMUSCULAR | Status: AC
  Administered 2023-08-12: 50 ug via INTRAVENOUS
  Filled 2023-08-12: qty 1

## 2023-08-12 MED ORDER — PANTOPRAZOLE SODIUM 40 MG PO TBEC
40.0000 mg | DELAYED_RELEASE_TABLET | Freq: Two times a day (BID) | ORAL | Status: DC
  Administered 2023-08-12 – 2023-08-14 (×4): 40 mg via ORAL
  Filled 2023-08-12 (×4): qty 1

## 2023-08-12 MED ORDER — ONDANSETRON HCL 4 MG/2ML IJ SOLN
4.0000 mg | Freq: Once | INTRAMUSCULAR | Status: AC
  Administered 2023-08-12: 4 mg via INTRAVENOUS
  Filled 2023-08-12: qty 2

## 2023-08-12 MED ORDER — LACTATED RINGERS IV SOLN
INTRAVENOUS | Status: AC
Start: 2023-08-12 — End: 2023-08-13

## 2023-08-12 MED ORDER — METHOCARBAMOL 500 MG PO TABS: 500.0000 mg | ORAL_TABLET | Freq: Four times a day (QID) | ORAL | Status: DC | PRN

## 2023-08-12 MED ORDER — SODIUM CHLORIDE 0.9 % IV SOLN
500.0000 mg | INTRAVENOUS | Status: DC
  Administered 2023-08-12 – 2023-08-13 (×2): 500 mg via INTRAVENOUS
  Filled 2023-08-12 (×2): qty 5

## 2023-08-12 MED ORDER — HYDROXYCHLOROQUINE SULFATE 200 MG PO TABS
200.0000 mg | ORAL_TABLET | Freq: Every day | ORAL | Status: DC
  Administered 2023-08-12 – 2023-08-14 (×3): 200 mg via ORAL
  Filled 2023-08-12 (×3): qty 1

## 2023-08-12 MED ORDER — LEFLUNOMIDE 10 MG PO TABS
20.0000 mg | ORAL_TABLET | Freq: Every day | ORAL | Status: DC
  Administered 2023-08-12 – 2023-08-14 (×3): 20 mg via ORAL
  Filled 2023-08-12 (×3): qty 2

## 2023-08-12 NOTE — ED Provider Notes (Signed)
 [place] EMERGENCY DEPARTMENT AT [facility] Provider Note   CSN: [id number] Arrival date & time: 08/12/23  1246     History  Chief Complaint  Patient presents with   Chest Pain   Weakness    [name] is a 83 y.o. female.   Chest Pain Associated symptoms: weakness   Weakness Associated symptoms: chest pain      Patient has a history of hypertension hypercholesterolemia rheumatoid arthritis, congestive heart failure, obesity, coronary artery disease, and recent hospitalizations for sepsis, sigmoid stricture, and recent surgery for a diverting colostomy in February of this year.  Patient presents to the ED with complaints of chest pain that started today.  Patient states its on the left side of her chest.  Been ongoing all morning.  She denies any fevers or chills.  No recent cough.  Patient is currently at [facility] rehab.  She was sent to the ED for further evaluation  Home Medications Prior to Admission medications   Medication Sig Start Date End Date Taking? Authorizing Provider  acetaminophen (TYLENOL) 650 MG suppository Place 1 suppository (650 mg total) rectally 3 (three) times daily. 08/04/23   Angiulli, [name], PA-C  albuterol (VENTOLIN HFA) 108 (90 Base) MCG/ACT inhaler Inhale 2 puffs into the lungs as needed for wheezing or shortness of breath.    [provider]  atorvastatin (LIPITOR) 10 MG tablet Take 1 tablet (10 mg total) by mouth daily. 08/14/22   [name], MD  azithromycin (ZITHROMAX) 250 MG tablet One tab m-wed -friday 08/05/23   Angiulli, [name], PA-C  Cholecalciferol (VITAMIN D3) 50 MCG (2000 UT) capsule Take 2,000 Units by mouth daily.    [provider]  diclofenac Sodium (VOLTAREN) 1 % GEL Apply 2 g topically 3 (three) times daily. 08/04/23   Angiulli, [name], PA-C  DULoxetine (CYMBALTA) 60 MG capsule Take 1 capsule (60 mg total) by mouth daily. 08/05/23   Angiulli, [name], PA-C  furosemide (LASIX) 40 MG  tablet Take 1 tablet (40 mg total) by mouth daily. 08/05/23   Angiulli, [name], PA-C  gabapentin (NEURONTIN) 300 MG capsule Take 1 capsule (300 mg total) by mouth 2 (two) times daily. 08/04/23   Angiulli, [name], PA-C  guaiFENesin (MUCINEX) 600 MG 12 hr tablet Take 2 tablets (1,200 mg total) by mouth 2 (two) times daily. 08/04/23   Angiulli, [name], PA-C  hydroxychloroquine (PLAQUENIL) 200 MG tablet Take 1 tablet (200 mg total) by mouth daily. 07/22/23   Ghimire, Werner Lean, MD  leflunomide (ARAVA) 20 MG tablet Take 1 tablet (20 mg total) by mouth daily. 07/22/23 08/21/23  Ghimire, Werner Lean, MD  levothyroxine (SYNTHROID) 50 MCG tablet Take 1 tablet (50 mcg total) by mouth daily. 11/28/22   [name], MD  lidocaine (LIDODERM) 5 % Place 1 patch onto the skin daily. Remove & Discard patch within 12 hours or as directed by MD 08/04/23   Angiulli, [name], PA-C  magnesium gluconate (MAGONATE) 500 MG tablet Take 0.5 tablets (250 mg total) by mouth at bedtime. 08/04/23   Angiulli, [name], PA-C  methocarbamol (ROBAXIN) 500 MG tablet Take 1 tablet (500 mg total) by mouth every 6 (six) hours as needed for muscle spasms (pain). 08/04/23   Angiulli, [name], PA-C  metoprolol tartrate (LOPRESSOR) 25 MG tablet Take 0.5 tablets (12.5 mg total) by mouth 2 (two) times daily. 08/04/23   Angiulli, [name], PA-C  metoprolol tartrate (LOPRESSOR) 50 MG tablet Take 1  tablet (50 mg total) by mouth 2 (two) times daily. 08/04/23   Angiulli, [name], PA-C  morphine (MS CONTIN) 15 MG 12 hr tablet Take 1 tablet (15 mg total) by mouth every 12 (twelve) hours. 08/04/23   Angiulli, [name], PA-C  morphine (MSIR) 15 MG tablet Take 1 tablet (15 mg total) by mouth 2 (two) times daily as needed for severe pain (pain score 7-10). 08/04/23   Angiulli, [name], PA-C  Multiple Vitamin (MULTIVITAMIN WITH MINERALS) TABS Take 1 tablet by mouth daily.     [provider]  nitroGLYCERIN (NITROSTAT) 0.4 MG SL tablet Place 1 tablet  (0.4 mg total) under the tongue every 5 (five) minutes as needed for chest pain. 08/04/23   Angiulli, [name], PA-C  OXYGEN Inhale 3 L into the lungs See admin instructions. 3 L at bedtime, and 3 L during the day as needed for exertion    [provider]  pantoprazole (PROTONIX) 40 MG tablet Take 1 tablet (40 mg total) by mouth 2 (two) times daily. 06/23/23   [name], MD  potassium chloride SA (KLOR-CON M) 20 MEQ tablet Take 2 tablets (40 mEq total) by mouth 2 (two) times daily. 08/04/23   Angiulli, [name], PA-C  predniSONE (DELTASONE) 10 MG tablet Take 1 tablet (10 mg total) by mouth daily with breakfast. 05/22/21   [name], MD  spironolactone (ALDACTONE) 25 MG tablet Take 0.5 tablets (12.5 mg total) by mouth daily. 08/05/23   Angiulli, [name], PA-C      Allergies    Codeine, Infliximab, Lisinopril, and Ofev [nintedanib]    Review of Systems   Review of Systems  Cardiovascular:  Positive for chest pain.  Neurological:  Positive for weakness.    Physical Exam Updated Vital Signs BP (!) 136/91   Pulse (!) 122   Temp 98.5 F (36.9 C) (Oral)   Resp 11   Ht 1.6 m (5\' 3" )   Wt 86.8 kg   SpO2 100%   BMI 33.90 kg/m  Physical Exam Vitals and nursing note reviewed.  Constitutional:      Appearance: She is ill-appearing.  HENT:     Head: Normocephalic and atraumatic.     Right Ear: External ear normal.     Left Ear: External ear normal.  Eyes:     General: No scleral icterus.       Right eye: No discharge.        Left eye: No discharge.     Conjunctiva/sclera: Conjunctivae normal.  Neck:     Trachea: No tracheal deviation.  Cardiovascular:     Rate and Rhythm: Regular rhythm. Tachycardia present.  Pulmonary:     Effort: Pulmonary effort is normal. No respiratory distress.     Breath sounds: Normal breath sounds. No stridor. No wheezing or rales.  Chest:     Chest wall: No mass or tenderness.  Abdominal:     General: Bowel sounds are normal. There is  no distension.     Palpations: Abdomen is soft. There is no mass.     Tenderness: There is no abdominal tenderness. There is no guarding or rebound.  Musculoskeletal:        General: No tenderness or deformity.     Cervical back: Neck supple.     Comments: Extremities are warm and well-perfused  Skin:    General: Skin is warm and dry.     Findings: No rash.  Neurological:     General: No  focal deficit present.     Mental Status: She is alert.     Cranial Nerves: No cranial nerve deficit, dysarthria or facial asymmetry.     Sensory: No sensory deficit.     Motor: No abnormal muscle tone or seizure activity.     Coordination: Coordination normal.  Psychiatric:        Mood and Affect: Mood normal.     ED Results / Procedures / Treatments   Labs (all labs ordered are listed, but only abnormal results are displayed) Labs Reviewed  CBC - Abnormal; Notable for the following components:      Result Value   WBC 11.4 (*)    HCT 48.5 (*)    MCHC 29.7 (*)    RDW 16.6 (*)    All other components within normal limits  COMPREHENSIVE METABOLIC PANEL - Abnormal; Notable for the following components:   CO2 20 (*)    Glucose, Bld 127 (*)    BUN 31 (*)    Creatinine, Ser 1.64 (*)    Total Protein 6.2 (*)    Albumin 3.2 (*)    GFR, Estimated 31 (*)    All other components within normal limits  D-DIMER, QUANTITATIVE (NOT AT [facility]) - Abnormal; Notable for the following components:   D-Dimer, Quant 1.44 (*)    All other components within normal limits  CBG MONITORING, ED - Abnormal; Notable for the following components:   Glucose-Capillary 118 (*)    All other components within normal limits  TROPONIN I (HIGH SENSITIVITY) - Abnormal; Notable for the following components:   Troponin I (High Sensitivity) 26 (*)    All other components within normal limits  RESP PANEL BY RT-PCR (RSV, FLU A&B, COVID)  RVPGX2  LIPASE, BLOOD  I-STAT CG4 LACTIC ACID, ED  I-STAT CG4 LACTIC ACID, ED  SAMPLE TO  BLOOD BANK  TROPONIN I (HIGH SENSITIVITY)    EKG EKG Interpretation Date/Time:  Wednesday August 12 2023 12:56:09 EDT Ventricular Rate:  131 PR Interval:  131 QRS Duration:  83 QT Interval:  291 QTC Calculation: 430 R Axis:   -50  Text Interpretation: Sinus tachycardia Atrial premature complexes Left anterior fascicular block Abnormal R-wave progression, early transition Left ventricular hypertrophy Since last tracing rate faster Confirmed by Linwood Dibbles [phone]) on 08/12/2023 1:01:58 PM  Radiology DG Chest Portable 1 View Result Date: 08/12/2023 CLINICAL DATA:  Chest pain EXAM: PORTABLE CHEST 1 VIEW COMPARISON:  Chest x-ray 07/15/2023 FINDINGS: The aorta is ectatic. The heart is enlarged. There are mild strandy and hazy opacities in the bilateral lower lobes, left greater than right. The costophrenic angles are clear. There is no pneumothorax or acute fracture. IMPRESSION: 1. Mild strandy and hazy opacities in the bilateral lower lobes, left greater than right, which may represent atelectasis or infection. 2. Cardiomegaly. Electronically Signed   By: Darliss Cheney M.D.   On: 08/12/2023 14:50    Procedures Procedures    Medications Ordered in ED Medications  ondansetron (ZOFRAN) injection 4 mg (4 mg Intravenous Given 08/12/23 1337)  fentaNYL (SUBLIMAZE) injection 50 mcg (50 mcg Intravenous Given 08/12/23 1337)  sodium chloride 0.9 % bolus 500 mL (500 mLs Intravenous New Bag/Given 08/12/23 1336)    ED Course/ Medical Decision Making/ A&P Clinical Course as of 08/12/23 1529  Wed Aug 12, 2023  1452 D-dimer, quantitative(!) D-dimer elevated at 1.44 [JK]  1452 CBC(!) CBC shows elevated white count 11.4 [JK]  1452 Troponin I (High Sensitivity)(!) Initial troponin elevated at 26, this  is similar to previous values [JK]  1452 Chest x-ray shows hazy opacities in the bilateral lower lobes left greater than right, possible atelectasis or infection [JK]  1525 Discussed with Dr Isidoro Donning. [JK]     Clinical Course User Index [JK] Linwood Dibbles, MD                                 Medical Decision Making Differential diagnosis includes but not limited to NSTEMI pneumonia pneumothorax PE ACS  Problems Addressed: Chest pain, unspecified type: acute illness or injury that poses a threat to life or bodily functions Tachycardia: acute illness or injury that poses a threat to life or bodily functions  Amount and/or Complexity of Data Reviewed Labs: ordered. Decision-making details documented in ED Course. Radiology: ordered.  Risk OTC drugs. Prescription drug management. Decision regarding hospitalization.   Patient presented to ED for evaluation of chest pain.  She had been in the hospital recently for sepsis and bowel surgery.  Patient reports chest pain today in the left side of her chest.  Patient noted to be tachycardic on arrival.  Initial troponin elevated at 26 but this is similar to previous values.  Chest x-ray suggested the possibility of atelectasis or pneumonia.  Patient does not have lactic acidosis.  She has not been having any respiratory symptoms lower suspicion for pneumonia.  D-dimer elevated at 1.44.  Concerning for the possibility of pulmonary embolism causing her chest pain and tachycardia.  Patient's creatinine is elevated.  She does not have an adequate IV for CT angio.  Will order VQ scan.  Will order empiric heparin for now. I will consult the medical service for admission and further evaluation  Case discussed with Dr Isidoro Donning        Final Clinical Impression(s) / ED Diagnoses Final diagnoses:  Chest pain, unspecified type  Tachycardia    Rx / DC Orders ED Discharge Orders     None         Linwood Dibbles, MD 08/12/23 1530

## 2023-08-12 NOTE — H&P (Addendum)
 History and Physical  Patient: [name]:[id number] DOB: 1940-12-12 DOA: 08/12/2023 DOS: the patient was seen and examined on 08/12/2023 Patient coming from: [facility]   Chief Complaint:  Chief Complaint  Patient presents with   Chest Pain   Weakness   HPI: [name] is a 83 y.o. female with PMH significant of chronic diastolic CHF, hypothyroidism, diabetes mellitus type 2, iron deficient anemia, hyperlipidemia, CKD stage III, RA on chronic steroids, chronic respiratory failure, COPD, on home O2 3 L at baseline, chronic pain, obesity presented from [facility]F due to tachycardia, chest pain, shortness of breath.  Patient had been in CIR 07/20/23-08/04/2023, prior to that she was admitted for sepsis 06/30/2023-07/10/2023, had sigmoid stricture and surgery for diverting colostomy.  History was obtained from the patient and her son at the bedside.  Patient reported that she had midsternal chest pain radiating to the left side this morning, associated with some shortness of breath, worse with exertion.  Denies any fever chills, cough or any productive phlegm. Patient has been not very ambulatory since the prior admission.  Also reported left leg pain and some swelling  ED course:  In ED, patient was noted to have temp of 97.9 F, respiratory rate 11, pulse 122, BP 136/91, O2 sats 100%  COVID, flu, RSV negative Chest x-ray showed mild opacities in the bilateral lower lobes left greater than right, may represent atelectasis or infection, cardiomegaly  D-dimer 1.44  Labs showed sodium 136, BUN 31, creatinine 1.6 WBCs 11.4, hemoglobin 14.4, hematocrit 48.5 Review of Systems: As mentioned in the history of present illness. All other systems reviewed and are negative. Past Medical History:  Diagnosis Date   Acute encephalopathy 03/30/2021   11/12 - 04/03/2021 acute encephalopathy in the context of altered mental status and poor oral intake following increasing pain medicines and  associated Klebsiella UTI.     Acute kidney injury superimposed on chronic kidney disease (HCC) 04/17/2015   11/12 - 04/03/2021 AKI with creatinine of 2.22 and GFR 22 indicating stage IV renal disease in the context of encephalopathy secondary to increased pain medications with decreased oral intake of fluids and nutrition.     Acute on chronic diastolic (congestive) heart failure (HCC)    Anemia    iron deficiency anemia - secondary to blood loss ( chronic)    Anxiety    Arthritis    endstage changes bilateral knees/bilateral ankles.    Asthma    Carotid artery occlusion    Chest pain of uncertain etiology 01/28/2012   Normal coronaries at cath     Chronic fatigue    Chronic kidney disease    Closed intertrochanteric fracture of hip, left, initial encounter (HCC) 11/29/2016   Closed left hip fracture (HCC)    Closed nondisplaced intertrochanteric fracture of left femur with delayed healing 01/14/2017   Clotting disorder (HCC)    pt denies this   Contusion of left knee    due to fall 1/14.   COPD (chronic obstructive pulmonary disease) (HCC)    pulmonary fibrosis   Coronary artery disease    Depression, reactive    Diabetes mellitus    type II    Diastolic dysfunction    Difficulty in walking    Family history of heart disease    Flu 06/26/2023   Generalized muscle weakness    Gout    High cholesterol    History of falling    Hypertension    Hypothyroidism    Interstitial lung  disease (HCC)    Meningioma of left sphenoid wing involving cavernous sinus (HCC) 02/17/2012   Continue diplopia, left eye pain and left headaches.     Morbid obesity (HCC)    Neuromuscular disorder (HCC)    diabetic neuropathy    Normal coronary arteries    cardiac catheterization performed  10/31/14   Pneumonia    RA (rheumatoid arthritis) (HCC)    has been off methotreaxte since 10/13.   Septic shock (HCC) 06/13/2023   Spinal stenosis of lumbar region    Thyroid disease    Unspecified lack  of coordination    URI (upper respiratory infection)    UTI (urinary tract infection) 03/31/2021   11/12 - 04/03/2021 Klebsiella UTI associated with encephalopathy in the context of AKI.  Status post IV ceftriaxone followed by oral Keflex x48 hours.     Past Surgical History:  Procedure Laterality Date   BRAIN SURGERY     Gamma knife 10/13. Needs repeat spring  '14   CARDIAC CATHETERIZATION N/A 10/31/2014   Procedure: Right/Left Heart Cath and Coronary Angiography;  Surgeon: Corky Crafts, MD;  Location: [facility] INVASIVE CV LAB;  Service: Cardiovascular;  Laterality: N/A;   COLONOSCOPY WITH PROPOFOL N/A 04/14/2022   Procedure: COLONOSCOPY WITH PROPOFOL;  Surgeon: Beverley Fiedler, MD;  Location: [facility] ENDOSCOPY;  Service: Gastroenterology;  Laterality: N/A;   CYST EXCISION     2 on face   CYST REMOVAL NECK     ESOPHAGOGASTRODUODENOSCOPY (EGD) WITH PROPOFOL N/A 09/14/2014   Procedure: ESOPHAGOGASTRODUODENOSCOPY (EGD) WITH PROPOFOL;  Surgeon: Louis Meckel, MD;  Location: [facility] ENDOSCOPY;  Service: Endoscopy;  Laterality: N/A;   INCISION AND DRAINAGE HIP Left 01/16/2017   Procedure: IRRIGATION AND DEBRIDEMENT LEFT HIP;  Surgeon: Kathryne Hitch, MD;  Location: [facility] ORS;  Service: Orthopedics;  Laterality: Left;   INTRAMEDULLARY (IM) NAIL INTERTROCHANTERIC Left 11/29/2016   Procedure: INTRAMEDULLARY (IM) NAIL INTERTROCHANTRIC;  Surgeon: Kathryne Hitch, MD;  Location: [facility] OR;  Service: Orthopedics;  Laterality: Left;   LAPAROSCOPIC LOOP COLOSTOMY N/A 07/17/2023   Procedure: LAPAROSCOPIC DIVERTING LOOP COLOSTOMY;  Surgeon: Manus Rudd, MD;  Location: [facility] OR;  Service: General;  Laterality: N/A;   OVARY SURGERY     RIGHT HEART CATH N/A 08/12/2021   Procedure: RIGHT HEART CATH;  Surgeon: Runell Gess, MD;  Location: [facility] INVASIVE CV LAB;  Service: Cardiovascular;  Laterality: N/A;   SHOULDER SURGERY Left    TONSILLECTOMY  age 85   VAGINAL HYSTERECTOMY     VIDEO BRONCHOSCOPY  Bilateral 05/31/2013   Procedure: VIDEO BRONCHOSCOPY WITHOUT FLUORO;  Surgeon: Kalman Shan, MD;  Location: [facility] ENDOSCOPY;  Service: Cardiopulmonary;  Laterality: Bilateral;   video bronscoscopy  05/19/1998   lung   Social History:  reports that she has never smoked. She has never used smokeless tobacco. She reports that she does not drink alcohol and does not use drugs. Allergies  Allergen Reactions   Codeine Swelling and Other (See Comments)    Facial swelling, Chest pain, and swelling in legs    Infliximab Anaphylaxis    (REMICADE) "sent me into shock"    Lisinopril Swelling and Rash    Face and neck swelling    Ofev [Nintedanib] Diarrhea   Family History  Problem Relation Age of Onset   Diabetes Mother    Heart attack Mother    Hypertension Father    Lung cancer Father    Diabetes Sister    Breast cancer Sister    Breast cancer Sister  Diabetes Brother    Hypertension Brother    Heart disease Brother    Heart attack Brother    Kidney cancer Brother    Gout Brother    Kidney failure Brother        x 5   Esophageal cancer Brother    Stomach cancer Brother    Prostate cancer Brother    Uterine cancer Daughter        with mets   Fibroids Daughter    Hypertension Daughter    Hypertension Daughter    Hypertension Son    Heart murmur Son    Rheum arthritis Maternal Uncle    Alzheimer's disease Neg Hx    Dementia Neg Hx    Prior to Admission medications   Medication Sig Start Date End Date Taking? Authorizing Provider  acetaminophen (TYLENOL) 650 MG suppository Place 1 suppository (650 mg total) rectally 3 (three) times daily. 08/04/23   Angiulli, [name], PA-C  albuterol (VENTOLIN HFA) 108 (90 Base) MCG/ACT inhaler Inhale 2 puffs into the lungs as needed for wheezing or shortness of breath.    [provider]  atorvastatin (LIPITOR) 10 MG tablet Take 1 tablet (10 mg total) by mouth daily. 08/14/22   [name], MD  azithromycin (ZITHROMAX) 250 MG  tablet One tab m-wed -friday 08/05/23   Angiulli, [name], PA-C  Cholecalciferol (VITAMIN D3) 50 MCG (2000 UT) capsule Take 2,000 Units by mouth daily.    [provider]  diclofenac Sodium (VOLTAREN) 1 % GEL Apply 2 g topically 3 (three) times daily. 08/04/23   Angiulli, [name], PA-C  DULoxetine (CYMBALTA) 60 MG capsule Take 1 capsule (60 mg total) by mouth daily. 08/05/23   Angiulli, [name], PA-C  furosemide (LASIX) 40 MG tablet Take 1 tablet (40 mg total) by mouth daily. 08/05/23   Angiulli, [name], PA-C  gabapentin (NEURONTIN) 300 MG capsule Take 1 capsule (300 mg total) by mouth 2 (two) times daily. 08/04/23   Angiulli, [name], PA-C  guaiFENesin (MUCINEX) 600 MG 12 hr tablet Take 2 tablets (1,200 mg total) by mouth 2 (two) times daily. 08/04/23   Angiulli, [name], PA-C  hydroxychloroquine (PLAQUENIL) 200 MG tablet Take 1 tablet (200 mg total) by mouth daily. 07/22/23   Ghimire, Werner Lean, MD  leflunomide (ARAVA) 20 MG tablet Take 1 tablet (20 mg total) by mouth daily. 07/22/23 08/21/23  Ghimire, Werner Lean, MD  levothyroxine (SYNTHROID) 50 MCG tablet Take 1 tablet (50 mcg total) by mouth daily. 11/28/22   [name], MD  lidocaine (LIDODERM) 5 % Place 1 patch onto the skin daily. Remove & Discard patch within 12 hours or as directed by MD 08/04/23   Angiulli, [name], PA-C  magnesium gluconate (MAGONATE) 500 MG tablet Take 0.5 tablets (250 mg total) by mouth at bedtime. 08/04/23   Angiulli, [name], PA-C  methocarbamol (ROBAXIN) 500 MG tablet Take 1 tablet (500 mg total) by mouth every 6 (six) hours as needed for muscle spasms (pain). 08/04/23   Angiulli, [name], PA-C  metoprolol tartrate (LOPRESSOR) 25 MG tablet Take 0.5 tablets (12.5 mg total) by mouth 2 (two) times daily. 08/04/23   Angiulli, [name], PA-C  metoprolol tartrate (LOPRESSOR) 50 MG tablet Take 1 tablet (50 mg total) by mouth 2 (two) times daily. 08/04/23   Angiulli, [name], PA-C  morphine (MS CONTIN) 15 MG 12 hr  tablet Take 1 tablet (15 mg total) by mouth every 12 (twelve) hours. 08/04/23  Angiulli, [name], PA-C  morphine (MSIR) 15 MG tablet Take 1 tablet (15 mg total) by mouth 2 (two) times daily as needed for severe pain (pain score 7-10). 08/04/23   Angiulli, [name], PA-C  Multiple Vitamin (MULTIVITAMIN WITH MINERALS) TABS Take 1 tablet by mouth daily.     [provider]  nitroGLYCERIN (NITROSTAT) 0.4 MG SL tablet Place 1 tablet (0.4 mg total) under the tongue every 5 (five) minutes as needed for chest pain. 08/04/23   Angiulli, [name], PA-C  OXYGEN Inhale 3 L into the lungs See admin instructions. 3 L at bedtime, and 3 L during the day as needed for exertion    [provider]  pantoprazole (PROTONIX) 40 MG tablet Take 1 tablet (40 mg total) by mouth 2 (two) times daily. 06/23/23   [name], MD  potassium chloride SA (KLOR-CON M) 20 MEQ tablet Take 2 tablets (40 mEq total) by mouth 2 (two) times daily. 08/04/23   Angiulli, [name], PA-C  predniSONE (DELTASONE) 10 MG tablet Take 1 tablet (10 mg total) by mouth daily with breakfast. 05/22/21   [name], MD  spironolactone (ALDACTONE) 25 MG tablet Take 0.5 tablets (12.5 mg total) by mouth daily. 08/05/23   Charlton Amor, PA-C   Physical Exam: Vitals:   08/12/23 1430 08/12/23 1500 08/12/23 1530 08/12/23 1533  BP: (!) 134/90 (!) 136/91 137/86   Pulse: (!) 119 (!) 122 (!) 125   Resp: 13 11 (!) 21   Temp:    97.9 F (36.6 C)  TempSrc:      SpO2: 100% 100% 100%   Weight:      Height:         General: Alert, awake, oriented x3, NAD. Eyes: pink conjunctiva, anicteric sclera, PERLA HEENT: normocephalic, atraumatic, oropharynx clear Neck: supple, no masses or lymphadenopathy, no JVD CVS: Regular rate and rhythm, no murmurs, rubs or gallops.  Tachycardia Resp : Clear to auscultation bilaterally, no wheezing, rales or rhonchi. GI : Soft, nontender, nondistended, positive bowel sounds. No hepatomegaly.  Ext: trace  lower extremity edema  Musculoskeletal: No clubbing or cyanosis, positive pedal pulses. No contracture. ROM intact  Neuro: Grossly intact, no focal neurological deficits, strength 5/5 upper and lower extremities bilaterally Psych: alert and oriented x 3, normal mood and affect Skin: no rashes or lesions, warm and dry   Data Reviewed: I have reviewed ED notes, Vitals, Lab results and outpatient records.   Recent Labs  Lab 08/12/23 1303  NA 136  K 4.9  CL 103  CO2 20*  GLUCOSE 127*  BUN 31*  CREATININE 1.64*  CALCIUM 9.1   Recent Labs  Lab 08/12/23 1303  WBC 11.4*  HGB 14.4  HCT 48.5*  MCV 97.8  PLT 183    Assessment and Plan Principal Problem:   Chest pain with tachycardia, DOE -Multifactorial, has history of chronic diastolic CHF, chronic respiratory failure with hypoxia, COPD, morbid obesity with possible obesity hypoventilation.  Has prior history of PE.,  Sedentary lifestyle not very ambulatory since previous admission.  Chest x-ray shows possible pneumonia. -D-dimer slightly elevated however high probability risk, left leg pain/swelling, obtain venous Dopplers lower extremities, VQ scan.  Unable to do CTA chest due to acute kidney injury. -Started on IV heparin until PE/DVT ruled out. -Previous echo in 05/2023 had shown preserved EF 60 to 65%, G1 DD  Active Problems: ?  Bilateral pneumonia, SIRS -Patient meets SIRS criteria with the tachycardia, mild leukocytosis, chest x-ray with  possible pneumonia -Chest x-ray showed mild opacities in the bilateral lower lobes left greater than right, may represent atelectasis or infection, cardiomegaly.  Patient denies any aspiration issues or difficulty swallowing. -For now placed on IV Rocephin, Zithromax -RSV, COVID, flu negative    Hypothyroidism -Resume Synthroid, obtain TSH    Essential hypertension -BP currently stable, resume metoprolol 50 mg twice daily    Dyslipidemia -Resume statin    Rheumatoid arthritis  (HCC) -Resume Plaquenil, Arava, prednisone    AKI (acute kidney injury) (HCC) -Hold furosemide, Aldactone    Chronic heart failure with preserved ejection fraction (HFpEF) (HCC) -Clinically appears dry, hold Lasix, spironolactone -Will place on gentle hydration x 1L    Type 2 diabetes mellitus with diabetic polyneuropathy, without long-term current use of insulin (HCC) -Placed on sliding scale insulin while inpatient    Iron deficiency anemia -H&H stable, closely monitor with heparin drip   History of sigmoid stricture s/p colostomy in feb 2025 - Colostomy care per wound care nursing. No acute issues per patient.   Advance Care Planning:   Code Status: DNR,  dicussed with the patient and son at the bedside.  Consults: None Family Communication: Son at the bedside Severity of Illness:      The appropriate patient status for this patient is INPATIENT. Inpatient status is judged to be reasonable and necessary in order to provide the required intensity of service to ensure the patient's safety. The patient's presenting symptoms, physical exam findings, and initial radiographic and laboratory data in the context of their chronic comorbidities is felt to place them at high risk for further clinical deterioration. Furthermore, it is not anticipated that the patient will be medically stable for discharge from the hospital within 2 midnights of admission.   * I certify that at the point of admission it is my clinical judgment that the patient will require inpatient hospital care spanning beyond 2 midnights from the point of admission due to high intensity of service, high risk for further deterioration and high frequency of surveillance required.*    Author: Thad Ranger, MD 08/12/2023 4:43 PM For on call review [email or web address].

## 2023-08-12 NOTE — ED Notes (Signed)
 Unable to pull from her a-c iv

## 2023-08-12 NOTE — ED Triage Notes (Signed)
Pt is lethargic ?

## 2023-08-12 NOTE — Progress Notes (Signed)
 PHARMACY - ANTICOAGULATION CONSULT NOTE  Pharmacy Consult for heparin  Indication:  suspected PE   Allergies  Allergen Reactions   Codeine Swelling and Other (See Comments)    Facial swelling, Chest pain, and swelling in legs    Infliximab Anaphylaxis    (REMICADE) "sent me into shock"    Lisinopril Swelling and Rash    Face and neck swelling    Ofev [Nintedanib] Diarrhea    Patient Measurements: Height: 5\' 3"  (160 cm) Weight: 86.8 kg (191 lb 5.8 oz) IBW/kg (Calculated) : 52.4 HEPARIN DW (KG): 71.9  Vital Signs: Temp: 98.5 F (36.9 C) (03/26 1256) Temp Source: Oral (03/26 1256) BP: 136/91 (03/26 1500) Pulse Rate: 122 (03/26 1500)  Labs: Recent Labs    08/12/23 1303  HGB 14.4  HCT 48.5*  PLT 183  CREATININE 1.64*  TROPONINIHS 26*    Estimated Creatinine Clearance: 28.1 mL/min (A) (by C-G formula based on SCr of 1.64 mg/dL (H)).   Medical History: Past Medical History:  Diagnosis Date   Acute encephalopathy 03/30/2021   11/12 - 04/03/2021 acute encephalopathy in the context of altered mental status and poor oral intake following increasing pain medicines and associated Klebsiella UTI.     Acute kidney injury superimposed on chronic kidney disease (HCC) 04/17/2015   11/12 - 04/03/2021 AKI with creatinine of 2.22 and GFR 22 indicating stage IV renal disease in the context of encephalopathy secondary to increased pain medications with decreased oral intake of fluids and nutrition.     Acute on chronic diastolic (congestive) heart failure (HCC)    Anemia    iron deficiency anemia - secondary to blood loss ( chronic)    Anxiety    Arthritis    endstage changes bilateral knees/bilateral ankles.    Asthma    Carotid artery occlusion    Chest pain of uncertain etiology 01/28/2012   Normal coronaries at cath     Chronic fatigue    Chronic kidney disease    Closed intertrochanteric fracture of hip, left, initial encounter (HCC) 11/29/2016   Closed left hip  fracture (HCC)    Closed nondisplaced intertrochanteric fracture of left femur with delayed healing 01/14/2017   Clotting disorder (HCC)    pt denies this   Contusion of left knee    due to fall 1/14.   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     Morbid obesity (HCC)    Neuromuscular disorder (HCC)    diabetic neuropathy    Normal coronary arteries    cardiac catheterization performed  10/31/14   Pneumonia    RA (rheumatoid arthritis) (HCC)    has been off methotreaxte since 10/13.   Septic shock (HCC) 06/13/2023   Spinal stenosis of lumbar region    Thyroid disease    Unspecified lack of coordination    URI (upper respiratory infection)    UTI (urinary  tract infection) 03/31/2021   11/12 - 04/03/2021 Klebsiella UTI associated with encephalopathy in the context of AKI.  Status post IV ceftriaxone followed by oral Keflex x48 hours.     Assessment: Patient presenting with chest pain and weakness. Concern for PE, not confirmed by imaging. Not on anticoagulation PTA. HgB 14.4 and PLTs 183. Pharmacy consulted to dose heparin gtt. V/Q scan ordered. Noted hx of GIB.    Goal of Therapy:  Heparin level 0.3-0.7 units/ml Monitor platelets by anticoagulation protocol: Yes   Plan:  Give 4000 units bolus x 1 Start heparin infusion at 1150 units/hr Check anti-Xa level in 8 hours and daily while on heparin Continue to monitor H&H and platelets  Estill Batten, PharmD, BCCCP   08/12/2023,3:25 PM

## 2023-08-12 NOTE — Progress Notes (Signed)
 Venous duplex lower ext  has been completed. Refer to [facility] under chart review to view preliminary results.   08/12/2023  5:53 PM Blayne Garlick, Gerarda Gunther

## 2023-08-12 NOTE — ED Triage Notes (Signed)
 Pt coming in from [place] . Pt reports chest pain and weakness started this morning. Pt reports that she got up and out of bed and started not feeling well.  Ems gave 324 Asprin  Ems vitals  130 hr  99% on 3L (baseline 3L) 184 CBG  126/84

## 2023-08-12 NOTE — Progress Notes (Signed)
 Patient ID: BLYSS LUGAR, female   DOB: Nov 08, 1940, 83 y.o.   MRN: [id number]  Patient arrived via stretcher. Connected to wall monitor and skin assessment done.  BP (!) 128/92   Pulse (!) 122   Temp 97.9 F (36.6 C)   Resp 18   Ht 5\' 3"  (1.6 m)   Wt 86.8 kg   SpO2 98%   BMI 33.90 kg/m    No c/o pain. Resting at this time. Son to order dinner tray.  Lidia Collum, RN

## 2023-08-13 ENCOUNTER — Ambulatory Visit: Payer: Medicare Other | Admitting: Nurse Practitioner

## 2023-08-13 ENCOUNTER — Inpatient Hospital Stay (HOSPITAL_COMMUNITY)

## 2023-08-13 ENCOUNTER — Encounter (HOSPITAL_COMMUNITY): Payer: Self-pay | Admitting: Internal Medicine

## 2023-08-13 DIAGNOSIS — R000 Tachycardia, unspecified: Secondary | ICD-10-CM | POA: Diagnosis not present

## 2023-08-13 DIAGNOSIS — E785 Hyperlipidemia, unspecified: Secondary | ICD-10-CM

## 2023-08-13 DIAGNOSIS — E039 Hypothyroidism, unspecified: Secondary | ICD-10-CM | POA: Diagnosis not present

## 2023-08-13 DIAGNOSIS — R079 Chest pain, unspecified: Secondary | ICD-10-CM | POA: Diagnosis not present

## 2023-08-13 LAB — CBC
HCT: 37.4 % (ref 36.0–46.0)
Hemoglobin: 11.4 g/dL — ABNORMAL LOW (ref 12.0–15.0)
MCH: 29.7 pg (ref 26.0–34.0)
MCHC: 30.5 g/dL (ref 30.0–36.0)
MCV: 97.4 fL (ref 80.0–100.0)
Platelets: 187 10*3/uL (ref 150–400)
RBC: 3.84 MIL/uL — ABNORMAL LOW (ref 3.87–5.11)
RDW: 16.6 % — ABNORMAL HIGH (ref 11.5–15.5)
WBC: 11.4 10*3/uL — ABNORMAL HIGH (ref 4.0–10.5)
nRBC: 0 % (ref 0.0–0.2)

## 2023-08-13 LAB — RENAL FUNCTION PANEL
Albumin: 2.7 g/dL — ABNORMAL LOW (ref 3.5–5.0)
Anion gap: 11 (ref 5–15)
BUN: 23 mg/dL (ref 8–23)
CO2: 19 mmol/L — ABNORMAL LOW (ref 22–32)
Calcium: 8.2 mg/dL — ABNORMAL LOW (ref 8.9–10.3)
Chloride: 108 mmol/L (ref 98–111)
Creatinine, Ser: 1.38 mg/dL — ABNORMAL HIGH (ref 0.44–1.00)
GFR, Estimated: 38 mL/min — ABNORMAL LOW (ref >60)
Glucose, Bld: 100 mg/dL — ABNORMAL HIGH (ref 70–99)
Phosphorus: 3.1 mg/dL (ref 2.5–4.6)
Potassium: 3.9 mmol/L (ref 3.5–5.1)
Sodium: 138 mmol/L (ref 135–145)

## 2023-08-13 LAB — GLUCOSE, CAPILLARY
Glucose-Capillary: 97 mg/dL (ref 70–99)
Glucose-Capillary: 119 mg/dL — ABNORMAL HIGH (ref 70–99)
Glucose-Capillary: 194 mg/dL — ABNORMAL HIGH (ref 70–99)
Glucose-Capillary: 120 mg/dL — ABNORMAL HIGH (ref 70–99)

## 2023-08-13 LAB — C DIFFICILE QUICK SCREEN W PCR REFLEX
C Diff antigen: NEGATIVE
C Diff interpretation: NOT DETECTED
C Diff toxin: NEGATIVE

## 2023-08-13 LAB — HEPARIN LEVEL (UNFRACTIONATED): Heparin Unfractionated: 0.29 [IU]/mL — ABNORMAL LOW (ref 0.30–0.70)

## 2023-08-13 MED ORDER — HEPARIN SODIUM (PORCINE) 5000 UNIT/ML IJ SOLN
5000.0000 [IU] | Freq: Three times a day (TID) | INTRAMUSCULAR | Status: DC
Start: 2023-08-13 — End: 2023-08-14
  Administered 2023-08-13 – 2023-08-14 (×3): 5000 [IU] via SUBCUTANEOUS
  Filled 2023-08-13 (×3): qty 1

## 2023-08-13 MED ORDER — HEPARIN BOLUS VIA INFUSION
1000.0000 [IU] | Freq: Once | INTRAVENOUS | Status: AC
Start: 2023-08-13 — End: 2023-08-13
  Administered 2023-08-13: 1000 [IU] via INTRAVENOUS
  Filled 2023-08-13: qty 1000

## 2023-08-13 MED ORDER — ORAL CARE MOUTH RINSE: 15.0000 mL | OROMUCOSAL | Status: DC | PRN

## 2023-08-13 MED ORDER — IOHEXOL 350 MG/ML SOLN
75.0000 mL | Freq: Once | INTRAVENOUS | Status: AC | PRN
Start: 2023-08-13 — End: 2023-08-13
  Administered 2023-08-13: 75 mL via INTRAVENOUS

## 2023-08-13 MED ORDER — MENTHOL 3 MG MT LOZG
1.0000 | LOZENGE | OROMUCOSAL | Status: DC | PRN
  Administered 2023-08-13: 3 mg via ORAL
  Filled 2023-08-13: qty 9

## 2023-08-13 NOTE — Progress Notes (Signed)
 PHARMACY - ANTICOAGULATION CONSULT NOTE  Pharmacy Consult for heparin  Indication:  suspected PE   Patient Measurements: Height: 5\' 3"  (160 cm) Weight: 86.8 kg (191 lb 5.8 oz) IBW/kg (Calculated) : 52.4 HEPARIN DW (KG): 71.9  Vital Signs: Temp: 99 F (37.2 C) (03/26 2210) Temp Source: Axillary (03/26 2210) BP: 108/77 (03/26 2210) Pulse Rate: 99 (03/27 0000)  Labs: Recent Labs    08/12/23 1303 08/12/23 1727 08/13/23 0000  HGB 14.4 12.4  --   HCT 48.5* 41.5  --   PLT 183 189  --   HEPARINUNFRC  --   --  0.29*  CREATININE 1.64* 1.49*  --   TROPONINIHS 26* 37*  --     Estimated Creatinine Clearance: 30.9 mL/min (A) (by C-G formula based on SCr of 1.49 mg/dL (H)).  Assessment: Patient presenting with chest pain and weakness. Concern for PE, not confirmed by imaging. Not on anticoagulation PTA. HgB 14.4 and PLTs 183. Pharmacy consulted to dose heparin gtt. V/Q scan ordered. Noted hx of GIB  AM heparin level slightly below goal on 1150 units/hr. Per RN, no signs/symptoms pf bleeding or issues running the infusion continuously.  Goal of Therapy:  Heparin level 0.3-0.7 units/ml Monitor platelets by anticoagulation protocol: Yes   Plan:  Small heparin bolus of 1000 units x 1 Start heparin infusion at 1300 units/hr Check anti-Xa level in 8 hours and daily while on heparin Continue to monitor H&H and platelets  Arabella Merles, PharmD. Clinical Pharmacist 08/13/2023 12:42 AM

## 2023-08-13 NOTE — Consult Note (Signed)
 [facility] Liaison Consult Note   08/13/2023  [name] February 11, 1941 [id number]  Insurance:  Medicare ACO REACH  Primary Care Provider: [name], MD with [facility], this provider is listed for the transition of care follow up appointments  and [facility] calls   RN Hospital Liaison [facility] Liaison Assessment Location:[id number]}    The patient was screened for 30 day readmission hospitalization with noted extreme high risk score for unplanned readmission risk 5 hospital admissions in 6 months.  The patient was assessed for potential [facility] Coordination service needs for post hospital transition for care coordination. Review of patient's electronic medical record reveals patient is admitted from [place] .   Plan: [facility] Liaison will continue to follow progress and disposition to assess for post hospital community care coordination/management needs.  Referral request for community care coordination: ***   [facility] Community Care, Population Health does not replace or interfere with any arrangements made by the Inpatient Transition of Care team.   For questions contact:   Charlesetta Shanks, RN, BSN, CCM [place]  [facility], [facility] Liaison Direct Dial: [phone] or secure chat Email: [email or web address]

## 2023-08-13 NOTE — Plan of Care (Signed)
  Problem: Clinical Measurements: Goal: Respiratory complications will improve Outcome: Progressing Goal: Cardiovascular complication will be avoided Outcome: Progressing   Problem: Nutrition: Goal: Adequate nutrition will be maintained Outcome: Progressing   Problem: Safety: Goal: Ability to remain free from injury will improve Outcome: Progressing   Problem: Respiratory: Goal: Ability to maintain adequate ventilation will improve Outcome: Progressing Goal: Ability to maintain a clear airway will improve Outcome: Progressing   Problem: Metabolic: Goal: Ability to maintain appropriate glucose levels will improve Outcome: Progressing   Problem: Nutritional: Goal: Maintenance of adequate nutrition will improve Outcome: Progressing

## 2023-08-13 NOTE — Consult Note (Addendum)
 WOC Nurse ostomy consult note Stoma type/location:  LUQ colostomy. The pt is follow by the ostomy clinic. According to the pt, she is having leaking daily. Stomal assessment/size: 30 mm x 45 mm Peristomal assessment: MASD periostomy at 12 and 6 o'clock position.  Treatment options for stomal/peristomal skin:  Powder #6, Barrier pad [id number], belt #621 and ring [id number] Output the bed nurse just cleaned the bag. Ostomy pouching: 1pc. #[id number] Education provided: Clean the skin with saline, apply powder on the lesion, remove the excess with gauze, apply barrier pad gently, promoting a thin layer above the powder. Cut the barrier as the size and shape as the ostomy (30 mm x 45 mm) Remove the plastic protection, Apply the ring surrounding the cut on the barrier, apply on the skin. Remove the tape protection. Close the lock in roll system on the ostomy bag. Empty when is 1/3 full. Enrolled patient in [employer] DC program: Yes.  The WOC team will check tomorrow if the leaking episodes stops.  Ordered supplies today.  Please reconsult if further assistance is needed. Thank-you,  Denyse Amass BSN, RN, [employer], WOC  (Pager: [phone])

## 2023-08-13 NOTE — Progress Notes (Signed)
 [facility]                                                                              [name], is a 83 y.o. female, DOB - 13-Dec-1940, ZOX:[id number] Admit date - 08/12/2023    Outpatient Primary MD for the patient is [name], MD  LOS - 1  days  Chief Complaint  Patient presents with   Chest Pain   Weakness       Brief summary   Patient is a 83 year old female chronic diastolic CHF, hypothyroidism, DM type 2, iron deficiency anemia, HLP, CKD stage III, RA on chronic steroids, chronic respiratory failure on 3 L O2 via [place] at baseline, COPD, chronic pain, obesity presented from [facility] due to tachycardia, chest pain, shortness of breath.  Patient had been in CIR 07/20/23-08/04/2023, prior to that she was admitted for sepsis 06/30/2023-07/10/2023, had sigmoid stricture and surgery for diverting colostomy.   Patient reported that she had midsternal chest pain radiating to the left side this morning, associated with some shortness of breath, worse with exertion.  Denied any fever chills, cough or any productive phlegm. Patient has been not very ambulatory since the prior admission.  Also reported left leg pain and some swelling In ED, patient was noted to have temp of 97.9 F, respiratory rate 11, pulse 122, BP 136/91, O2 sats 100%  COVID, flu, RSV negative Chest x-ray showed mild opacities in the bilateral lower lobes left greater than right, may represent atelectasis or infection, cardiomegaly  Assessment & Plan    Principal Problem:   Chest pain with tachycardia, DOE -Multifactorial, has history of chronic diastolic CHF, chronic respiratory failure with hypoxia, COPD, morbid obesity with possible obesity hypoventilation.  Has prior history of PE.,  Sedentary lifestyle not very ambulatory since previous admission.  Chest x-ray shows possible pneumonia. -D-dimer elevated, bilateral lower extremity venous Dopplers negative for DVT, CTA chest pending -For  now continue IV heparin drip until PE rule out - Previous echo in 05/2023 had shown preserved EF 60 to 65%, G1 DD   Active Problems:  Bilateral pneumonia, SIRS -Patient meets SIRS criteria with the tachycardia, mild leukocytosis, chest x-ray with possible pneumonia -Chest x-ray showed mild opacities in the bilateral lower lobes left greater than right, may represent atelectasis or infection, cardiomegaly.  Patient denies any aspiration issues or difficulty swallowing. -Continue IV Rocephin, Zithromax -RSV, COVID, flu negative  Mildly elevated troponins -Likely due to demand ischemia from #1, troponins 26-> 37 however presented with chest pain and tachycardia.  Tachycardia still persisting.  Chest pain improved.  EKG showed sinus tachycardia with atrial premature complexes, LAFB, LVH, no acute ST elevation or depression - will repeat 2D echo     Hypothyroidism -Resume Synthroid, TSH 1.2     Essential hypertension, tachycardia -BP currently stable, continue metoprolol 50 mg twice daily     Dyslipidemia -Resume statin     Rheumatoid arthritis (HCC) -Resume Plaquenil, Arava, prednisone     AKI (acute kidney injury) (HCC) -Creatinine 1.6 on admission -Continue to hold furosemide, spironolactone - creatinine improving, 1.38 today.     Chronic heart failure with  preserved ejection fraction (HFpEF) (HCC) -Clinically appeared dry, hold Lasix, spironolactone -Received gentle hydration     Type 2 diabetes mellitus with diabetic polyneuropathy, without long-term current use of insulin (HCC) -Placed on sliding scale insulin while inpatient CBG (last 3)  Recent Labs    08/12/23 1336 08/12/23 2102 08/13/23 0723  GLUCAP 118* 139* 97        Iron deficiency anemia -H&H stable, closely monitor with heparin drip    History of sigmoid stricture s/p colostomy in feb 2025 - Colostomy care per wound care nursing. No acute issues per patient.   Pressure injury documentation Sacrum stage  II, POA Left thigh posterior stage I, POA Care per nursing  Obesity class I Estimated body mass index is 33.9 kg/m as calculated from the following:   Height as of this encounter: 5\' 3"  (1.6 m).   Weight as of this encounter: 86.8 kg.  Code Status: DNR DVT Prophylaxis:  Place TED hose Start: 08/12/23 1653   Level of Care: Level of care: Telemetry Cardiac Family Communication: Updated patient Disposition Plan:      Remains inpatient appropriate: Hopefully DC to SNF in 24 to 48 hours if improving   Procedures:    Consultants:     Antimicrobials:   Anti-infectives (From admission, onward)    Start     Dose/Rate Route Frequency Ordered Stop   08/12/23 1930  hydroxychloroquine (PLAQUENIL) tablet 200 mg        200 mg Oral Daily 08/12/23 1838     08/12/23 1700  cefTRIAXone (ROCEPHIN) 2 g in sodium chloride 0.9 % 100 mL IVPB        2 g 200 mL/hr over 30 Minutes Intravenous Every 24 hours 08/12/23 1652 08/17/23 1659   08/12/23 1700  azithromycin (ZITHROMAX) 500 mg in sodium chloride 0.9 % 250 mL IVPB        500 mg 250 mL/hr over 60 Minutes Intravenous Every 24 hours 08/12/23 1652 08/17/23 1659          Medications  atorvastatin  10 mg Oral Daily   cholecalciferol  2,000 Units Oral Daily   gabapentin  300 mg Oral BID   hydroxychloroquine  200 mg Oral Daily   insulin aspart  0-9 Units Subcutaneous TID WC   leflunomide  20 mg Oral Daily   levothyroxine  50 mcg Oral Q0600   metoprolol tartrate  50 mg Oral BID   morphine  15 mg Oral Q12H   pantoprazole  40 mg Oral BID   predniSONE  10 mg Oral Q breakfast      Subjective:   Aryia Buerkle was seen and examined today.  No chest pain however having persistent tachycardia.  Now nausea vomiting, abdominal pain, fevers.  Still feeling weak.  No acute events overnight.    Objective:   Vitals:   08/13/23 0130 08/13/23 0406 08/13/23 0725 08/13/23 0803  BP:  (!) 131/91 109/73   Pulse: (!) 101 (!) 109 (!) 110 (!) 108   Resp: 18 15 16    Temp:  99.2 F (37.3 C) 97.6 F (36.4 C)   TempSrc:  Axillary Oral   SpO2: 100% 100% 98%   Weight:      Height:        Intake/Output Summary (Last 24 hours) at 08/13/2023 1003 Last data filed at 08/13/2023 0640 Gross per 24 hour  Intake 1476.03 ml  Output 400 ml  Net 1076.03 ml     Wt Readings from Last 3 Encounters:  08/12/23 86.8  kg  08/04/23 86.8 kg  07/17/23 90.7 kg     Exam General: Alert and oriented x 3, NAD Cardiovascular: S1 S2 auscultated,  RRR Respiratory: Diminished breath sounds bases, no wheezing Gastrointestinal: Soft, nontender, nondistended, + bowel sounds Ext: trace pedal edema bilaterally Neuro: Strength 5/5 upper and lower extremities bilaterally Psych: Normal affect     Data Reviewed:  I have personally reviewed following labs    CBC Lab Results  Component Value Date   WBC 11.4 (H) 08/13/2023   RBC 3.84 (L) 08/13/2023   HGB 11.4 (L) 08/13/2023   HCT 37.4 08/13/2023   MCV 97.4 08/13/2023   MCH 29.7 08/13/2023   PLT 187 08/13/2023   MCHC 30.5 08/13/2023   RDW 16.6 (H) 08/13/2023   LYMPHSABS 5.7 (H) 08/03/2023   MONOABS 0.7 08/03/2023   EOSABS 0.2 08/03/2023   BASOSABS 0.1 08/03/2023     Last metabolic panel Lab Results  Component Value Date   NA 138 08/13/2023   K 3.9 08/13/2023   CL 108 08/13/2023   CO2 19 (L) 08/13/2023   BUN 23 08/13/2023   CREATININE 1.38 (H) 08/13/2023   GLUCOSE 100 (H) 08/13/2023   GFRNONAA 38 (L) 08/13/2023   GFRAA 49 (L) 11/10/2019   CALCIUM 8.2 (L) 08/13/2023   PHOS 3.1 08/13/2023   PROT 6.2 (L) 08/12/2023   ALBUMIN 2.7 (L) 08/13/2023   LABGLOB 1.7 09/29/2022   AGRATIO 2.2 09/29/2022   BILITOT 0.9 08/12/2023   ALKPHOS 84 08/12/2023   AST 24 08/12/2023   ALT 22 08/12/2023   ANIONGAP 11 08/13/2023    CBG (last 3)  Recent Labs    08/12/23 1336 08/12/23 2102 08/13/23 0723  GLUCAP 118* 139* 97      Coagulation Profile: No results for input(s): "INR", "PROTIME" in the  last 168 hours.   Radiology Studies: I have personally reviewed the imaging studies  VAS [place] LOWER EXTREMITY VENOUS (DVT) Result Date: 08/12/2023  Lower Venous DVT Study Patient Name:  PERSIS GRAFFIUS  Date of Exam:   08/12/2023 Medical Rec #: [id number]          Accession #:    [id number] Date of Birth: 10-26-1940          Patient Gender: F Patient Age:   34 years Exam Location:  [facility] Procedure:      VAS [place] LOWER EXTREMITY VENOUS (DVT) Referring Phys: Taurus Willis --------------------------------------------------------------------------------  Indications: Swelling, Edema, Pain, and SOB.  Risk Factors: Obesity. Limitations: Body habitus and poor ultrasound/tissue interface. Comparison Study: 12/17/22 Right lower ext. Performing Technologist: [place] Sink Sturdivant-Jones RDMS, RVT  Examination Guidelines: A complete evaluation includes B-mode imaging, spectral Doppler, color Doppler, and power Doppler as needed of all accessible portions of each vessel. Bilateral testing is considered an integral part of a complete examination. Limited examinations for reoccurring indications may be performed as noted. The reflux portion of the exam is performed with the patient in reverse Trendelenburg.  +---------+---------------+---------+-----------+----------+-----------------+ RIGHT    CompressibilityPhasicitySpontaneityPropertiesThrombus Aging    +---------+---------------+---------+-----------+----------+-----------------+ CFV      Full           Yes      Yes                                    +---------+---------------+---------+-----------+----------+-----------------+ SFJ      Full                                                           +---------+---------------+---------+-----------+----------+-----------------+  FV Prox  Full                                                           +---------+---------------+---------+-----------+----------+-----------------+ FV Mid   Full                                                            +---------+---------------+---------+-----------+----------+-----------------+ FV DistalFull                                                           +---------+---------------+---------+-----------+----------+-----------------+ PFV      Full                                                           +---------+---------------+---------+-----------+----------+-----------------+ POP      Full                                                           +---------+---------------+---------+-----------+----------+-----------------+ PTV                                                   poorly visualized +---------+---------------+---------+-----------+----------+-----------------+ PERO                                                  poorly visualized +---------+---------------+---------+-----------+----------+-----------------+   +---------+---------------+---------+-----------+----------+-----------------+ LEFT     CompressibilityPhasicitySpontaneityPropertiesThrombus Aging    +---------+---------------+---------+-----------+----------+-----------------+ CFV      Full           Yes      Yes                                    +---------+---------------+---------+-----------+----------+-----------------+ SFJ      Full                                                           +---------+---------------+---------+-----------+----------+-----------------+ FV Prox  Full                                                           +---------+---------------+---------+-----------+----------+-----------------+  FV Mid   Full                                                           +---------+---------------+---------+-----------+----------+-----------------+ FV DistalFull                                                            +---------+---------------+---------+-----------+----------+-----------------+ PFV      Full                                                           +---------+---------------+---------+-----------+----------+-----------------+ POP      Full           Yes      Yes                                    +---------+---------------+---------+-----------+----------+-----------------+ PTV                                                   poorly visualized +---------+---------------+---------+-----------+----------+-----------------+ PERO                                                  poorly visualized +---------+---------------+---------+-----------+----------+-----------------+     Summary: BILATERAL: -No evidence of popliteal cyst, bilaterally. RIGHT: - There is no evidence of deep vein thrombosis in the lower extremity. However, portions of this examination were limited- see technologist comments above.  LEFT: - There is no evidence of deep vein thrombosis in the lower extremity. However, portions of this examination were limited- see technologist comments above.  *See table(s) above for measurements and observations. Electronically signed by Coral Else MD on 08/12/2023 at 10:00:59 PM.    Final    DG Chest Portable 1 View Result Date: 08/12/2023 CLINICAL DATA:  Chest pain EXAM: PORTABLE CHEST 1 VIEW COMPARISON:  Chest x-ray 07/15/2023 FINDINGS: The aorta is ectatic. The heart is enlarged. There are mild strandy and hazy opacities in the bilateral lower lobes, left greater than right. The costophrenic angles are clear. There is no pneumothorax or acute fracture. IMPRESSION: 1. Mild strandy and hazy opacities in the bilateral lower lobes, left greater than right, which may represent atelectasis or infection. 2. Cardiomegaly. Electronically Signed   By: Darliss Cheney M.D.   On: 08/12/2023 14:50       Crystallee Werden M.D. [facility] 08/13/2023, 10:03 AM  Available via Epic  secure chat 7am-7pm After 7 pm, please refer to night coverage provider listed on amion.

## 2023-08-14 ENCOUNTER — Inpatient Hospital Stay (HOSPITAL_COMMUNITY)

## 2023-08-14 DIAGNOSIS — E1142 Type 2 diabetes mellitus with diabetic polyneuropathy: Secondary | ICD-10-CM

## 2023-08-14 DIAGNOSIS — R079 Chest pain, unspecified: Secondary | ICD-10-CM | POA: Diagnosis not present

## 2023-08-14 DIAGNOSIS — I5032 Chronic diastolic (congestive) heart failure: Secondary | ICD-10-CM

## 2023-08-14 DIAGNOSIS — R7989 Other specified abnormal findings of blood chemistry: Secondary | ICD-10-CM | POA: Diagnosis not present

## 2023-08-14 DIAGNOSIS — R000 Tachycardia, unspecified: Secondary | ICD-10-CM | POA: Diagnosis not present

## 2023-08-14 LAB — RENAL FUNCTION PANEL
Albumin: 2.6 g/dL — ABNORMAL LOW (ref 3.5–5.0)
Anion gap: 6 (ref 5–15)
BUN: 20 mg/dL (ref 8–23)
CO2: 24 mmol/L (ref 22–32)
Calcium: 8 mg/dL — ABNORMAL LOW (ref 8.9–10.3)
Chloride: 108 mmol/L (ref 98–111)
Creatinine, Ser: 1.07 mg/dL — ABNORMAL HIGH (ref 0.44–1.00)
GFR, Estimated: 52 mL/min — ABNORMAL LOW (ref >60)
Glucose, Bld: 98 mg/dL (ref 70–99)
Phosphorus: 2.8 mg/dL (ref 2.5–4.6)
Potassium: 4.2 mmol/L (ref 3.5–5.1)
Sodium: 138 mmol/L (ref 135–145)

## 2023-08-14 LAB — ECHOCARDIOGRAM COMPLETE
AR max vel: 2.11 cm2
AV Area VTI: 1.92 cm2
AV Area mean vel: 1.88 cm2
AV Mean grad: 2 mmHg
AV Peak grad: 2.9 mmHg
Ao pk vel: 0.85 m/s
Area-P 1/2: 3.56 cm2
Calc EF: 65.3 %
Height: 63 in
MV VTI: 1.83 cm2
S' Lateral: 2.7 cm
Single Plane A2C EF: 60.9 %
Single Plane A4C EF: 67.2 %
Weight: 3061.75 [oz_av]

## 2023-08-14 LAB — CBC
HCT: 34.2 % — ABNORMAL LOW (ref 36.0–46.0)
Hemoglobin: 10.4 g/dL — ABNORMAL LOW (ref 12.0–15.0)
MCH: 29.5 pg (ref 26.0–34.0)
MCHC: 30.4 g/dL (ref 30.0–36.0)
MCV: 97.2 fL (ref 80.0–100.0)
Platelets: 186 10*3/uL (ref 150–400)
RBC: 3.52 MIL/uL — ABNORMAL LOW (ref 3.87–5.11)
RDW: 16.5 % — ABNORMAL HIGH (ref 11.5–15.5)
WBC: 9.4 10*3/uL (ref 4.0–10.5)
nRBC: 0 % (ref 0.0–0.2)

## 2023-08-14 LAB — GLUCOSE, CAPILLARY
Glucose-Capillary: 80 mg/dL (ref 70–99)
Glucose-Capillary: 101 mg/dL — ABNORMAL HIGH (ref 70–99)

## 2023-08-14 MED ORDER — MORPHINE SULFATE ER 15 MG PO TBCR: 15.0000 mg | EXTENDED_RELEASE_TABLET | Freq: Two times a day (BID) | ORAL | 0 refills | Status: DC

## 2023-08-14 MED ORDER — AZITHROMYCIN 500 MG PO TABS: 500.0000 mg | ORAL_TABLET | Freq: Every day | ORAL | Status: AC

## 2023-08-14 MED ORDER — MEDIHONEY WOUND/BURN DRESSING EX PSTE: 1.0000 | PASTE | Freq: Every day | CUTANEOUS | Status: AC

## 2023-08-14 MED ORDER — MAGNESIUM OXIDE 250 MG PO TABS: 250.0000 mg | ORAL_TABLET | Freq: Every day | ORAL | Status: DC

## 2023-08-14 MED ORDER — AZITHROMYCIN 250 MG PO TABS: 500.0000 mg | ORAL_TABLET | Freq: Every day | ORAL | Status: DC

## 2023-08-14 MED ORDER — MEDIHONEY WOUND/BURN DRESSING EX PSTE
1.0000 | PASTE | Freq: Every day | CUTANEOUS | Status: DC
  Administered 2023-08-14: 1 via TOPICAL
  Filled 2023-08-14: qty 44

## 2023-08-14 MED ORDER — PERFLUTREN LIPID MICROSPHERE
1.0000 mL | INTRAVENOUS | Status: DC | PRN
  Administered 2023-08-14: 3 mL via INTRAVENOUS

## 2023-08-14 MED ORDER — CEFPODOXIME PROXETIL 200 MG PO TABS: 200.0000 mg | ORAL_TABLET | Freq: Two times a day (BID) | ORAL | Status: AC

## 2023-08-14 NOTE — Progress Notes (Signed)
PHARMACIST - PHYSICIAN COMMUNICATION  CONCERNING: Antibiotic IV to Oral Route Change Policy  RECOMMENDATION: This patient is receiving Azithromycin by the intravenous route.  Based on criteria approved by the Pharmacy and Therapeutics Committee, the antibiotic(s) is/are being converted to the equivalent oral dose form(s).   DESCRIPTION: These criteria include:  Patient being treated for a respiratory tract infection, urinary tract infection, cellulitis or clostridium difficile associated diarrhea if on metronidazole  The patient is not neutropenic and does not exhibit a GI malabsorption state  The patient is eating (either orally or via tube) and/or has been taking other orally administered medications for a least 24 hours  The patient is improving clinically and has a Tmax < 100.5  If you have questions about this conversion, please contact the Pharmacy Department  []   [phone] )  [facility] []   [phone] )  [facility] [x]   [phone] )  [facility] []   [phone] )  [facility] []   [phone] )  [place], PharmD Clinical Pharmacist **Pharmacist phone directory can now be found on [email or web address] ([email or web address]).  Listed under [place].

## 2023-08-14 NOTE — Evaluation (Signed)
 Physical Therapy Brief Evaluation and Discharge Note Patient Details Name: [name] MRN: [id number] DOB: January 19, 1941 Today's Date: 08/14/2023   History of Present Illness  83 yo female presenting to [facility] on 08/13/23 from [facility]  due to tachycardia, chest pain, shortness of breath. Chest x-ray showed mild opacities in the bilateral lower lobes left greater than right, may represent atelectasis or infection, cardiomegaly.PMH Recent CIR  steay 3/3-3/18 discharged to [facility] due to failure to progress HTN, hypothyroidism, history of RA, COPD, DM, high cholesterol, CHF  Clinical Impression  Pt working with PT/OT at [facility]. Pt requires Mod A for bed mobility and maxAx2 for stand step transfer bed to recliner. PT recommends return to [facility] and resumption of therapy services       PT Assessment All further PT needs can be met in the next venue of care  Assistance Needed at Discharge  Frequent or constant Supervision/Assistance    Equipment Recommendations None recommended by PT     Precautions/Restrictions Precautions Precautions: Fall Precaution/Restrictions Comments: ostomy, monitor O2 and HR Restrictions Other Position/Activity Restrictions: No        Mobility  Bed Mobility   Supine/Sidelying to sit: Mod assist   General bed mobility comments: modA for pad scoot to EOB and pt to pull up against therapist to come  Transfers Overall transfer level: Needs assistance Equipment used: 2 person hand held assist Transfers: Sit to/from Stand, Bed to chair/wheelchair/BSC Sit to Stand: Mod assist, +2 physical assistance   Step pivot transfers: Max assist, +2 physical assistance       General transfer comment: modAx2 to come to standing from elevated surface with RW unable to pivot provided 2 person assist and pt requires maxAx2 for pivot to chair           Balance Overall balance assessment: Needs assistance Sitting-balance support: Bilateral upper  extremity supported, Feet supported Sitting balance-Leahy Scale: Poor     Standing balance support: Bilateral upper extremity supported, Reliant on assistive device for balance Standing balance-Leahy Scale: Zero            Pertinent Vitals/Pain PT - Brief Vital Signs All Vital Signs Stable: No Exception to Vital Signs Stable: SpO2 on RA at rest 96%O2, placed on 3L O2 via [place] for mobility, able to maintain SpO2 92%O2 with mobilization, returned to her base line 2L O2 via  at end of session. HR max with moving 110 bpm Pain Assessment Pain Assessment: Faces Faces Pain Scale: Hurts a little bit Pain Location: sacral wound with mobility Pain Descriptors / Indicators: Grimacing, Guarding Pain Intervention(s): Limited activity within patient's tolerance, Monitored during session, Repositioned     Home Living Family/patient expects to be discharged to:: Skilled nursing facility             Additional Comments: back to [facility] today    Prior Function Level of Independence: Needs assistance      UE/LE Assessment   UE ROM/Strength/Tone/Coordination: Generalized weakness    LE ROM/Strength/Tone/Coordination: Generalized weakness      Communication   Communication Communication: Other (comment);Impaired (raspy voice) Factors Affecting Communication: Reduced clarity of speech     Cognition Overall Cognitive Status: Appears within functional limits for tasks assessed/performed              Assessment/Plan    PT Problem List Decreased strength;Decreased activity tolerance;Decreased balance;Decreased mobility;Decreased coordination;Cardiopulmonary status limiting activity;Pain       PT Visit Diagnosis Unsteadiness on feet (R26.81);Muscle weakness (generalized) (M62.81);Difficulty in walking, not elsewhere  classified (R26.2)      AMPAC 6 Clicks Help needed turning from your back to your side while in a flat bed without using bedrails?: A Lot Help needed moving  from lying on your back to sitting on the side of a flat bed without using bedrails?: Total Help needed moving to and from a bed to a chair (including a wheelchair)?: Total Help needed standing up from a chair using your arms (e.g., wheelchair or bedside chair)?: Total Help needed to walk in hospital room?: Total Help needed climbing 3-5 steps with a railing? : Total 6 Click Score: 7      End of Session Equipment Utilized During Treatment: Oxygen Activity Tolerance: Patient limited by fatigue Patient left: with call bell/phone within reach;in chair;with chair alarm set Nurse Communication: Mobility status PT Visit Diagnosis: Unsteadiness on feet (R26.81);Muscle weakness (generalized) (M62.81);Difficulty in walking, not elsewhere classified (R26.2)     Time: 4782-9562 PT Time Calculation (min) (ACUTE ONLY): 36 min  Charges:   PT Evaluation $PT Eval Moderate Complexity: 1 Mod     Arvle Grabe B. Beverely Risen PT, DPT Acute Rehabilitation Services Please use secure chat or  Call Office [phone]   Elon Alas [facility]  08/14/2023, 12:21 PM

## 2023-08-14 NOTE — Consult Note (Signed)
 WOC Nurse Consult Note: Reason for Consult: Requested to assess sacrum wound. Performed remotely. Wound type: Sacrum wound stage 3 Pressure Injury POA: Yes Measurement: 1 cm x 1 cm Wound bed: 100% yellow, hypergranulation. Drainage (amount, consistency, odor) Minimum amount, serous. Periwound: intact, scars for old PI. Dressing procedure/placement/frequency: Apply Medihoney daily, cover with foam dressing (change every 3 days or PRN)  WOC team will not plan to follow further for this wound.   Please reconsult if further assistance is needed. Thank-you,  Denyse Amass BSN, RN, [employer], WOC  (Pager: [phone])

## 2023-08-14 NOTE — TOC Transition Note (Signed)
 Transition of Care [facility]) - Discharge Note   Patient Details  Name: [name] MRN: [id number] Date of Birth: 27-Aug-1940  Transition of Care [facility]) CM/SW Contact:  Erin Sons, LCSW Phone Number: 08/14/2023, 1:17 PM   Clinical Narrative:      Per MD patient ready for DC to [facility]. RN, patient, patient's family, and facility notified of DC. Discharge Summary and FL2 sent to facility. RN to call report prior to discharge [phone]). DC packet on chart. Ambulance transport requested for patient.   CSW will sign off for now as social work intervention is no longer needed. Please consult [place] again if new needs arise.     Barriers to Discharge: No Barriers Identified      Discharge Placement              Patient chooses bed at: [facility] Patient to be transferred to facility by: [facility] Name of family member notified: Pt states she notified her son Patient and family notified of of transfer: 08/14/23  Discharge Plan and Services Additional resources added to the After Visit Summary for                                       Social Drivers of Health (SDOH) Interventions SDOH Screenings   Food Insecurity: No Food Insecurity (08/12/2023)  Housing: Low Risk  (08/12/2023)  Transportation Needs: No Transportation Needs (08/12/2023)  Utilities: Not At Risk (08/12/2023)  Alcohol Screen: Low Risk  (10/27/2022)  Depression (PHQ2-9): High Risk (06/12/2023)  Financial Resource Strain: Low Risk  (06/11/2023)  Physical Activity: Inactive (06/11/2023)  Social Connections: Socially Integrated (08/12/2023)  Stress: No Stress Concern Present (10/27/2022)  Tobacco Use: Low Risk  (08/12/2023)     Readmission Risk Interventions    07/17/2023    3:28 PM 07/08/2023   10:09 AM 12/16/2022   11:09 AM  Readmission Risk Prevention Plan  Transportation Screening Complete Complete Complete  PCP or Specialist Appt within 5-7 Days   Complete  Home Care Screening    Complete  Medication Review (RN CM)   Complete  Medication Review (RN Care Manager) Complete Referral to Pharmacy   PCP or Specialist appointment within 3-5 days of discharge Complete Complete   HRI or Home Care Consult Complete Complete   SW Recovery Care/Counseling Consult Complete    Palliative Care Screening Not Applicable Not Applicable   Skilled Nursing Facility Complete Complete

## 2023-08-14 NOTE — Evaluation (Signed)
 Occupational Therapy Evaluation and Discharge Patient Details Name: [name] MRN: [id number] DOB: 1940-12-23 Today's Date: 08/14/2023   History of Present Illness   83 y.o.  female presented chest pain, tachycardia and SOB due to PNA. Hx of recent colostomy, chronic hypoxic respiratory failure on home O2-3 L, RA on chronic steroids, VTE on anticoagulation, HTN, DM-2, chronic sigmoid stricture related to diverticular diseas.     Clinical Impressions Pt has been working in therapy at [facility] and starting to take some steps. Pt requires assist for bathing, dressing and toileting. Pt presents with generalized weakness, poor sitting balance and zero standing balance with flexed posture. She requires +2 max assist to transfer bed to chair with B UE support. Pt is dependent on 2-3 L of O2 during exertion to maintain O2 sats >90%. Patient will benefit from continued inpatient follow up therapy, <3 hours/day.      If plan is discharge home, recommend the following:   Two people to help with walking and/or transfers;Two people to help with bathing/dressing/bathroom;Assistance with cooking/housework;Assist for transportation;Help with stairs or ramp for entrance;Direct supervision/assist for medications management;Direct supervision/assist for financial management     Functional Status Assessment         Equipment Recommendations   Other (comment) (defer)     Recommendations for Other Services         Precautions/Restrictions   Precautions Precautions: Fall Precaution/Restrictions Comments: ostomy, monitor O2 and HR Restrictions Weight Bearing Restrictions Per Provider Order: No     Mobility Bed Mobility Overal bed mobility: Needs Assistance Bed Mobility: Supine to Sit     Supine to sit: HOB elevated, Mod assist     General bed mobility comments: assist for hip to EOB and to raise trunk    Transfers Overall transfer level: Needs assistance Equipment  used: Rolling walker (2 wheels), 2 person hand held assist Transfers: Sit to/from Stand, Bed to chair/wheelchair/BSC Sit to Stand: +2 physical assistance, Mod assist     Step pivot transfers: Max assist, +2 physical assistance            Balance Overall balance assessment: Needs assistance Sitting-balance support: Bilateral upper extremity supported, Feet supported Sitting balance-Leahy Scale: Poor   Postural control: Right lateral lean Standing balance support: Bilateral upper extremity supported, Reliant on assistive device for balance Standing balance-Leahy Scale: Zero                             ADL either performed or assessed with clinical judgement   ADL Overall ADL's : Needs assistance/impaired Eating/Feeding: Independent;Sitting   Grooming: Set up;Sitting   Upper Body Bathing: Maximal assistance;Sitting   Lower Body Bathing: +2 for physical assistance;Total assistance;Sit to/from stand   Upper Body Dressing : Moderate assistance;Sitting   Lower Body Dressing: Total assistance;+2 for physical assistance;Sit to/from stand   Toilet Transfer: +2 for physical assistance;Maximal assistance;Stand-pivot   Toileting- Clothing Manipulation and Hygiene: +2 for physical assistance;Total assistance;Sit to/from stand               Vision   Additional Comments: wears glasses     Perception         Praxis         Pertinent Vitals/Pain Pain Assessment Pain Assessment: Faces Faces Pain Scale: Hurts a little bit Pain Location: generalized Pain Descriptors / Indicators: Discomfort Pain Intervention(s): Monitored during session, Repositioned     Extremity/Trunk Assessment Upper Extremity Assessment Upper Extremity Assessment: Generalized weakness (reports  she frequently drops items)   Lower Extremity Assessment Lower Extremity Assessment: Defer to PT evaluation       Communication Communication Communication: No apparent  difficulties Factors Affecting Communication: Reduced clarity of speech   Cognition Arousal: Alert Behavior During Therapy: WFL for tasks assessed/performed Cognition: Cognition impaired       Memory impairment (select all impairments): Short-term memory                       Following commands: Intact       Cueing  General Comments   Cueing Techniques: Verbal cues;Gestural cues      Exercises     Shoulder Instructions      Home Living Family/patient expects to be discharged to:: Skilled nursing facility                                 Additional Comments: back to [facility] today      Prior Functioning/Environment Prior Level of Function : Needs assist             Mobility Comments: had started taking steps in PT ADLs Comments: Assisted for bathing,dressing and toileting    OT Problem List:     OT Treatment/Interventions:        OT Goals(Current goals can be found in the care plan section)       OT Frequency:       Co-evaluation PT/OT/SLP Co-Evaluation/Treatment: Yes            AM-PAC OT "6 Clicks" Daily Activity     Outcome Measure Help from another person eating meals?: None Help from another person taking care of personal grooming?: A Little Help from another person toileting, which includes using toliet, bedpan, or urinal?: Total Help from another person bathing (including washing, rinsing, drying)?: A Lot Help from another person to put on and taking off regular upper body clothing?: A Lot Help from another person to put on and taking off regular lower body clothing?: Total 6 Click Score: 13   End of Session Equipment Utilized During Treatment: Oxygen;Rolling walker (2 wheels);Gait belt  Activity Tolerance: Patient tolerated treatment well Patient left: in chair;with call bell/phone within reach;with chair alarm set  OT Visit Diagnosis: Unsteadiness on feet (R26.81);Muscle weakness (generalized) (M62.81)                 Time: 1610-9604 OT Time Calculation (min): 37 min Charges:  OT General Charges $OT Visit: 1 Visit OT Evaluation $OT Eval Moderate Complexity: 1 Mod  Berna Spare, OTR/L Acute Rehabilitation Services Office: [phone]  Evern Bio 08/14/2023, 12:23 PM

## 2023-08-14 NOTE — Progress Notes (Signed)
  Echocardiogram 2D Echocardiogram has been performed.  Ocie Doyne RDCS 08/14/2023, 1:53 PM

## 2023-08-14 NOTE — Consult Note (Signed)
 WOC Nurse ostomy follow up Visited to check if the ostomy bag is appropriated seal. According to the pt, the bag is not leaking since yesterday. All the supplies order are in her room. Talked to her daughter by phone about her health regarding to her ostomy and wound care.  Confirmed that all the instructions must to be on discharge paper. Offer further instructions to the SNF if they have doubts about ostomy care.   WOC team will not plan to follow further.  Please reconsult if further assistance is needed. Thank-you,  Denyse Amass BSN, RN, [employer], WOC  (Pager: [phone])

## 2023-08-14 NOTE — NC FL2 (Signed)
  MEDICAID FL2 LEVEL OF CARE FORM     IDENTIFICATION  Patient Name: [name] Birthdate: 05/28/1940 Sex: female Admission Date (Current Location): 08/12/2023  [facility] and [place] Number:  Producer, television/film/video and Address:  [facility], [address] [place], [place] [address]      Provider Number: [id number]  Attending Physician Name and Address:  Cathren Harsh, MD  Relative Name and Phone Number:  Lyda Jester (son) [phone]    Current Level of Care: Hospital Recommended Level of Care: Skilled Nursing Facility Prior Approval Number:    Date Approved/Denied:   PASRR Number: [id number]  Discharge Plan: SNF    Current Diagnoses: Patient Active Problem List   Diagnosis Date Noted   Sepsis (HCC) 08/12/2023   Anxiety state 07/24/2023   Septic shock (HCC) 07/20/2023   Type 2 diabetes mellitus with diabetic polyneuropathy, without long-term current use of insulin (HCC) 07/20/2023   Iron deficiency anemia 07/20/2023   Hyperlipidemia 07/20/2023   Chronic diastolic congestive heart failure (HCC) 07/20/2023   Abdominal pain 07/16/2023   GI bleed 07/15/2023   Enterocolitis 06/30/2023   Sepsis due to undetermined organism (HCC) 06/30/2023   Hypothyroidism 06/30/2023   GERD without esophagitis 06/30/2023   Peripheral neuropathy 06/30/2023   RSV (respiratory syncytial virus pneumonia) 06/26/2023   Ileus (HCC) 06/23/2023   Lower GI bleed 12/16/2022   BRBPR (bright red blood per rectum) 12/15/2022   [name] 09/08/2022   Abnormal CT scan, sigmoid colon 04/14/2022   Colon stricture (HCC) 04/14/2022   Diverticulosis of colon without hemorrhage 04/14/2022   History of pulmonary embolus (PE) 11/15/2021   Tachycardia 10/22/2021   Pulmonary HTN (HCC)    Unspecified protein-calorie malnutrition (HCC) 04/05/2021   Pressure injury of skin 03/31/2021   Thrombocytopenia (HCC) 03/30/2021   Diabetic  neuropathy (HCC) 05/24/2019   Hypokalemia 04/21/2019   Anticoagulated 06/25/2018   Chronic pain 06/25/2018   CRI (chronic renal insufficiency), stage 3 (moderate) (HCC) 06/25/2018   Drug-induced constipation 06/18/2018   DVT (deep venous thrombosis) (HCC) 06/03/2018   Chest pain 06/02/2018   Primary osteoarthritis of both knees 05/26/2018   Body mass index (BMI) of 50-59.9 in adult (HCC) 05/26/2018   Acquired hypothyroidism 05/26/2018   Chronic bilateral low back pain without sciatica 02/17/2018   Astigmatism with presbyopia, bilateral 03/26/2017   Cortical age-related cataract of both eyes 03/26/2017   Family history of glaucoma 03/26/2017   Long term current use of oral hypoglycemic drug 03/26/2017   Nuclear sclerotic cataract of both eyes 03/26/2017   Pain in left hip 02/18/2017   Gout 11/29/2016   Depression 11/29/2016   Physical deconditioning 07/31/2015   Pulmonary fibrosis, postinflammatory (HCC) 07/31/2015   Bronchiectasis without complication (HCC) 07/31/2015   Tendinitis of left rotator cuff 06/13/2015   Chronic heart failure with preserved ejection fraction (HFpEF) (HCC) 04/21/2015   AKI (acute kidney injury) (HCC)    Chronic respiratory failure with hypoxia (HCC) 02/09/2015   Dyspnea and respiratory abnormality 01/03/2015   Normal coronary arteries 11/02/2014   Morbid obesity (HCC) 10/30/2014   Dysphagia 08/21/2014   Debility 06/21/2014   DOE (dyspnea on exertion) 05/24/2014   Primary osteoarthritis of left knee 04/26/2014   High risk medication use 04/17/2014   Aphasia 02/12/2014   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) 01/18/2014   Lumbar facet arthropathy 01/18/2014   Bilateral edema of lower extremity 05/30/2013   Chronic cough 05/24/2013   ILD (  interstitial lung disease) (HCC) 04/10/2013   Spinal stenosis of lumbar region with radiculopathy 07/12/2012   Inability to walk 07/12/2012   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  (HCC) 02/17/2012   Rheumatoid arthritis (HCC) 01/28/2012   Primary osteoarthritis of right knee 01/28/2012   Dyslipidemia    Essential hypertension     Orientation RESPIRATION BLADDER Height & Weight     Self, Time, Situation, Place  O2 (3L nasal cannula) Incontinent Weight: 191 lb 5.8 oz (86.8 kg) Height:  5\' 3"  (160 cm)  BEHAVIORAL SYMPTOMS/MOOD NEUROLOGICAL BOWEL NUTRITION STATUS      Colostomy Diet (see d/c summar)  AMBULATORY STATUS COMMUNICATION OF NEEDS Skin   Limited Assist Verbally Other (Comment) (Ecchymosis,Leg,L,Lower,Erythema,Buttocks,Groin,Thigh,Bil.,Wound/Incision LDAs,PI sacrum,Bil.,upper,mid,Lower,stage 2,PI thigh,L,posterior,proximal,stage 1)                       Personal Care Assistance Level of Assistance  Bathing, Feeding, Dressing Bathing Assistance: Limited assistance Feeding assistance: Limited assistance Dressing Assistance: Limited assistance     Functional Limitations Info  Sight, Hearing, Speech Sight Info: Impaired Hearing Info: Impaired Speech Info: Adequate    SPECIAL CARE FACTORS FREQUENCY  OT (By licensed OT), PT (By licensed PT)     PT Frequency: 5x/week OT Frequency: 5x/week            Contractures Contractures Info: Not present    Additional Factors Info  Code Status, Allergies Code Status Info: DNR Allergies Info: Codeine,Infliximab,Lisinopril,Ofev (nintedanib) Psychotropic Info: gabapentin (NEURONTIN) capsule 300 mg 2 times daily Insulin Sliding Scale Info: insulin aspart (novoLOG) injection 0-9 Units 3 times daily with meals       Current Medications (08/14/2023):  This is the current hospital active medication list Current Facility-Administered Medications  Medication Dose Route Frequency Provider Last Rate Last Admin   acetaminophen (TYLENOL) tablet 650 mg  650 mg Oral Q6H PRN Rai, Ripudeep K, MD       Or   acetaminophen (TYLENOL) suppository 650 mg  650 mg Rectal Q6H PRN Rai, Ripudeep K, MD       atorvastatin  (LIPITOR) tablet 10 mg  10 mg Oral Daily Rai, Ripudeep K, MD   10 mg at 08/14/23 1013   azithromycin (ZITHROMAX) 500 mg in sodium chloride 0.9 % 250 mL IVPB  500 mg Intravenous Q24H Rai, Ripudeep K, MD 250 mL/hr at 08/13/23 1805 500 mg at 08/13/23 1805   cefTRIAXone (ROCEPHIN) 2 g in sodium chloride 0.9 % 100 mL IVPB  2 g Intravenous Q24H Rai, Ripudeep K, MD 200 mL/hr at 08/13/23 1705 2 g at 08/13/23 1705   cholecalciferol (VITAMIN D3) 25 MCG (1000 UNIT) tablet 2,000 Units  2,000 Units Oral Daily Rai, Ripudeep K, MD   2,000 Units at 08/13/23 1001   gabapentin (NEURONTIN) capsule 300 mg  300 mg Oral BID Rai, Ripudeep K, MD   300 mg at 08/14/23 1013   heparin injection 5,000 Units  5,000 Units Subcutaneous Q8H Rai, Ripudeep K, MD   5,000 Units at 08/14/23 8413   hydroxychloroquine (PLAQUENIL) tablet 200 mg  200 mg Oral Daily Rai, Ripudeep K, MD   200 mg at 08/14/23 1013   insulin aspart (novoLOG) injection 0-9 Units  0-9 Units Subcutaneous TID WC Rai, Ripudeep K, MD   2 Units at 08/13/23 1801   leflunomide (ARAVA) tablet 20 mg  20 mg Oral Daily Rai, Ripudeep K, MD   20 mg at 08/14/23 1013   leptospermum manuka honey (MEDIHONEY) paste 1 Application  1  Application Topical Daily Rai, Ripudeep K, MD       levothyroxine (SYNTHROID) tablet 50 mcg  50 mcg Oral Q0600 Rai, Ripudeep K, MD   50 mcg at 08/14/23 1610   menthol-cetylpyridinium (CEPACOL) lozenge 3 mg  1 lozenge Oral PRN Janalyn Shy, Subrina, MD   3 mg at 08/13/23 2232   methocarbamol (ROBAXIN) tablet 500 mg  500 mg Oral Q6H PRN Rai, Ripudeep K, MD       metoprolol tartrate (LOPRESSOR) tablet 50 mg  50 mg Oral BID Rai, Ripudeep K, MD   50 mg at 08/14/23 1013   morphine (MS CONTIN) 12 hr tablet 15 mg  15 mg Oral Q12H Rai, Ripudeep K, MD   15 mg at 08/14/23 1013   nitroGLYCERIN (NITROSTAT) SL tablet 0.4 mg  0.4 mg Sublingual Q5 min PRN Rai, Ripudeep K, MD       Oral care mouth rinse  15 mL Mouth Rinse PRN Rai, Ripudeep K, MD       pantoprazole (PROTONIX) EC  tablet 40 mg  40 mg Oral BID Rai, Ripudeep K, MD   40 mg at 08/14/23 1013   polyethylene glycol (MIRALAX / GLYCOLAX) packet 17 g  17 g Oral Daily PRN Rai, Ripudeep K, MD       predniSONE (DELTASONE) tablet 10 mg  10 mg Oral Q breakfast Rai, Ripudeep K, MD   10 mg at 08/14/23 0900     Discharge Medications: Please see discharge summary for a list of discharge medications.  Relevant Imaging Results:  Relevant Lab Results:   Additional Information [id number]  Erin Sons, LCSW

## 2023-08-14 NOTE — Plan of Care (Signed)
  Problem: Health Behavior/Discharge Planning: Goal: Ability to manage health-related needs will improve Outcome: Progressing   Problem: Clinical Measurements: Goal: Respiratory complications will improve Outcome: Progressing Goal: Cardiovascular complication will be avoided Outcome: Progressing   Problem: Safety: Goal: Ability to remain free from injury will improve Outcome: Progressing   Problem: Skin Integrity: Goal: Risk for impaired skin integrity will decrease Outcome: Progressing

## 2023-08-14 NOTE — Discharge Summary (Signed)
 Physician Discharge Summary   Patient: [name] MRN: [id number] DOB: April 06, 1941  Admit date:     08/12/2023  Discharge date: 08/14/23  Discharge Physician: Thad Ranger, MD    PCP: [name], MD   Recommendations at discharge:   Continue cefpodoxime 200 mg twice a day for 3 days Continue Zithromax 500 mg p.o. daily for 2 days.  After that patient can resume her prophylactic dose of her Zithromax per her outpatient schedule.    Discharge Diagnoses:    Chest pain with DOE, likely due to pneumonia  SIRS with bilateral pneumonia   Hypothyroidism   Essential hypertension   Dyslipidemia   Rheumatoid arthritis (HCC)   DOE (dyspnea on exertion)   Morbid obesity (HCC)   AKI (acute kidney injury) (HCC)   Chronic heart failure with preserved ejection fraction (HFpEF) (HCC)   Tachycardia   Type 2 diabetes mellitus with diabetic polyneuropathy, without long-term current use of insulin (HCC)   Iron deficiency anemia     Hospital Course:  Patient is a 83 year old female chronic diastolic CHF, hypothyroidism, DM type 2, iron deficiency anemia, HLP, CKD stage III, RA on chronic steroids, chronic respiratory failure on 3 L O2 via [place] at baseline, COPD, chronic pain, obesity presented from [facility] due to tachycardia, chest pain, shortness of breath.  Patient had been in CIR 07/20/23-08/04/2023, prior to that she was admitted for sepsis 06/30/2023-07/10/2023, had sigmoid stricture and surgery for diverting colostomy.   Patient reported that she had midsternal chest pain radiating to the left side this morning, associated with some shortness of breath, worse with exertion.  Denied any fever chills, cough or any productive phlegm. Patient has been not very ambulatory since the prior admission.  Also reported left leg pain and some swelling In ED, patient was noted to have temp of 97.9 F, respiratory rate 11, pulse 122, BP 136/91, O2 sats 100%  COVID, flu, RSV negative Chest x-ray  showed mild opacities in the bilateral lower lobes left greater than right, may represent atelectasis or infection, cardiomegaly   Assessment and Plan:   Chest pain with tachycardia, DOE -Multifactorial, has history of chronic diastolic CHF, chronic respiratory failure with hypoxia, COPD, morbid obesity with possible obesity hypoventilation.  Has prior history of PE.,  Sedentary lifestyle not very ambulatory since previous admission.  Chest x-ray shows possible pneumonia. -D-dimer was mildly elevated, bilateral lower extremity venous Dopplers negative for DVT, CTA chest showed no PE Initially placed on IV heparin drip, discontinued after PE, DVT ruled out Previous echo in 05/2023 had shown preserved EF 60 to 65%, G1 DD -Patient feels well, back to baseline     Bilateral pneumonia, SIRS -Patient meets SIRS criteria with the tachycardia, mild leukocytosis, chest x-ray with possible pneumonia -Chest x-ray showed mild opacities in the bilateral lower lobes left greater than right, may represent atelectasis or infection, cardiomegaly.  Patient denies any aspiration issues or difficulty swallowing. -Placed on IV Rocephin, Zithromax, transition to oral Zithromax and cefpodoxime for 3 more days. -RSV, COVID, flu negative   Mildly elevated troponins -Likely due to demand ischemia from #1, troponins 26-> 37 however presented with chest pain and tachycardia.  Tachycardia still persisting.  Chest pain improved.  EKG showed sinus tachycardia with atrial premature complexes, LAFB, LVH, no acute ST elevation or depression -Patient now feels back to baseline Outpatient follow-up with cardiology with Dr. Allyson Sabal recommended     Hypothyroidism -Resume Synthroid, TSH 1.2     Essential hypertension, tachycardia -BP  currently stable, continue metoprolol     Dyslipidemia -Resume statin     Rheumatoid arthritis (HCC) -Resume Plaquenil, Arava, prednisone     AKI (acute kidney injury) (HCC) -Creatinine 1.6  on admission Initially furosemide, Aldactone held. -Now improved, creatinine 1.0 at discharge, continue furosemide 40 mg daily     Chronic heart failure with preserved ejection fraction (HFpEF) (HCC) -Clinically appeared dry, held Lasix, spironolactone -Received gentle hydration -Patient can resume Lasix 40 mg daily.  Will continue to hold Aldactone. -Recommended outpatient follow-up with her cardiologist, Dr. Allyson Sabal     Type 2 diabetes mellitus with diabetic polyneuropathy, without long-term current use of insulin (HCC) -Patient was placed on sliding scale insulin while inpatient       Iron deficiency anemia -H&H stable, hemoglobin 10.4 at discharge.    History of sigmoid stricture s/p colostomy in feb 2025 - Colostomy care per wound care nursing. No acute issues per patient.    Pressure injury documentation Sacrum stage II, POA Left thigh posterior stage I, POA Care per nursing   Obesity class I Estimated body mass index is 33.9 kg/m as calculated from the following:   Height as of this encounter: 5\' 3"  (1.6 m).   Weight as of this encounter: 86.8 kg.     Pain control - [employer] Controlled Substance Reporting System database was reviewed. and patient was instructed, not to drive, operate heavy machinery, perform activities at heights, swimming or participation in water activities or provide baby-sitting services while on Pain, Sleep and Anxiety Medications; until their outpatient Physician has advised to do so again. Also recommended to not to take more than prescribed Pain, Sleep and Anxiety Medications.  Consultants: None Procedures performed: CT angiogram, Doppler ultrasound lower extremities Disposition: Skilled nursing facility Diet recommendation:  Discharge Diet Orders (From admission, onward)     Start     Ordered   08/14/23 0000  Diet - low sodium heart healthy        08/14/23 1149                  DISCHARGE MEDICATION: Allergies as of 08/14/2023        Reactions   Codeine Swelling, Other (See Comments)   Facial and leg swelling Chest pain   Remicade [infliximab] Anaphylaxis   "sent me into shock"   Zestril [lisinopril] Swelling, Rash   Face and neck swelling   Ofev [nintedanib] Diarrhea        Medication List     PAUSE taking these medications    azithromycin 250 MG tablet Wait to take this until: August 17, 2023 Morning Commonly known as: ZITHROMAX One tab m-wed -friday You also have another medication with the same name that you may need to continue taking.       STOP taking these medications    Magnesium Gluconate 250 MG Tabs   spironolactone 25 MG tablet Commonly known as: ALDACTONE       TAKE these medications    acetaminophen 500 MG tablet Commonly known as: TYLENOL Take 1,000 mg by mouth every 8 (eight) hours as needed (pain).   albuterol 108 (90 Base) MCG/ACT inhaler Commonly known as: VENTOLIN HFA Inhale 2 puffs into the lungs in the morning and at bedtime.   atorvastatin 10 MG tablet Commonly known as: LIPITOR Take 1 tablet (10 mg total) by mouth daily. What changed: when to take this   azithromycin 500 MG tablet Commonly known as: ZITHROMAX Take 1 tablet (500 mg total) by mouth  daily for 2 days. What changed: Another medication with the same name was paused. Ask your nurse or doctor if you should take this medication.   bisacodyl 10 MG suppository Commonly known as: DULCOLAX Place 10 mg rectally daily as needed (bowel management).   cefpodoxime 200 MG tablet Commonly known as: VANTIN Take 1 tablet (200 mg total) by mouth 2 (two) times daily for 3 days.   diclofenac Sodium 1 % Gel Commonly known as: VOLTAREN Apply 2 g topically 3 (three) times daily.   DULoxetine 60 MG capsule Commonly known as: CYMBALTA Take 1 capsule (60 mg total) by mouth daily.   Enema Enem Place 1 enema rectally daily as needed (constipation not relieved by bisacodyl suppository).   furosemide 40 MG  tablet Commonly known as: LASIX Take 1 tablet (40 mg total) by mouth daily.   gabapentin 300 MG capsule Commonly known as: NEURONTIN Take 1 capsule (300 mg total) by mouth 2 (two) times daily.   guaiFENesin 600 MG 12 hr tablet Commonly known as: MUCINEX Take 2 tablets (1,200 mg total) by mouth 2 (two) times daily.   hydroxychloroquine 200 MG tablet Commonly known as: PLAQUENIL Take 1 tablet (200 mg total) by mouth daily.   leflunomide 20 MG tablet Commonly known as: ARAVA Take 1 tablet (20 mg total) by mouth daily.   leptospermum manuka honey Pste paste Apply 1 Application topically daily. Interchangeable with TheraHoney Apply thin layer (3 mm) to wound. Start taking on: August 15, 2023   levothyroxine 50 MCG tablet Commonly known as: SYNTHROID Take 1 tablet (50 mcg total) by mouth daily. What changed: when to take this   lidocaine 5 % Commonly known as: LIDODERM Place 1 patch onto the skin daily. Remove & Discard patch within 12 hours or as directed by MD   magnesium hydroxide 400 MG/5ML suspension Commonly known as: MILK OF MAGNESIA Take 30 mLs by mouth daily as needed (no BM in 3 days).   Magnesium Oxide 250 MG Tabs Take 1 tablet (250 mg total) by mouth daily.   methocarbamol 500 MG tablet Commonly known as: ROBAXIN Take 1 tablet (500 mg total) by mouth every 6 (six) hours as needed for muscle spasms (pain).   METOPROLOL TARTRATE PO Take 62.5 mg by mouth every 12 (twelve) hours.   morphine 15 MG 12 hr tablet Commonly known as: MS CONTIN Take 1 tablet (15 mg total) by mouth every 12 (twelve) hours.   multivitamin tablet Take 1 tablet by mouth daily.   nitroGLYCERIN 0.4 MG SL tablet Commonly known as: NITROSTAT Place 1 tablet (0.4 mg total) under the tongue every 5 (five) minutes as needed for chest pain.   OXYGEN Inhale 3 L/min into the lungs at bedtime.   pantoprazole 40 MG tablet Commonly known as: PROTONIX Take 1 tablet (40 mg total) by mouth 2  (two) times daily. What changed: when to take this   polyethylene glycol powder 17 GM/SCOOP powder Commonly known as: GLYCOLAX/MIRALAX Take 17 g by mouth daily as needed (bowel management).   potassium chloride SA 20 MEQ tablet Commonly known as: KLOR-CON M Take 2 tablets (40 mEq total) by mouth 2 (two) times daily.   predniSONE 10 MG tablet Commonly known as: DELTASONE Take 1 tablet (10 mg total) by mouth daily with breakfast. What changed: when to take this   senna 8.6 MG Tabs tablet Commonly known as: SENOKOT Take 17.2 mg by mouth daily as needed (bowel management).   Vitamin D3 50 MCG (2000 UT)  Tabs Take 2,000 Units by mouth daily.   ZINC OXIDE EX Apply 1 application  topically See admin instructions. Apply topically to sacral area twice daily with morning/night shift and as needed with each incontinent episode.               Discharge Care Instructions  (From admission, onward)           Start     Ordered   08/14/23 0000  Discharge wound care:       Comments: Wound care   Sacrum: Apply daily a thin layer of Medihoney on the wound bed, cover with foam dressing (change every 3 days or PRN)   08/14/23 1149            Follow-up Information     [name], MD. Schedule an appointment as soon as possible for a visit in 2 week(s).   Specialty: Family Medicine Why: for hospital follow-up Contact information: [address] [place] [facility] [place] [address] [phone]         Runell Gess, MD. Schedule an appointment as soon as possible for a visit in 2 week(s).   Specialties: Cardiology, Radiology Why: for hospital follow-up Contact information: [address] [place] [address] [phone]                Discharge Exam: Ceasar Mons Weights   08/12/23 1258  Weight: 86.8 kg   S: Feels a lot better today, ready to go back to SNF.  No chest pain, shortness of breath, fevers or chills.  BP 113/69   Pulse (!) 102    Temp 98.4 F (36.9 C) (Oral)   Resp 11   Ht 5\' 3"  (1.6 m)   Wt 86.8 kg   SpO2 99%   BMI 33.90 kg/m   Physical Exam General: Alert and oriented x 3, NAD Cardiovascular: S1 S2 clear, RRR.  Respiratory: CTAB, no wheezing Gastrointestinal: Soft, nontender, nondistended, NBS Ext: no pedal edema bilaterally Neuro: no new deficits Psych: Normal affect    Condition at discharge: fair  The results of significant diagnostics from this hospitalization (including imaging, microbiology, ancillary and laboratory) are listed below for reference.   Imaging Studies: CT Angio Chest Pulmonary Embolism (PE) W or WO Contrast Result Date: 08/13/2023 CLINICAL DATA:  Positive D-dimer. Chest pain and shortness of breath. Concern for pulmonary embolism. EXAM: CT ANGIOGRAPHY CHEST WITH CONTRAST TECHNIQUE: Multidetector CT imaging of the chest was performed using the standard protocol during bolus administration of intravenous contrast. Multiplanar CT image reconstructions and MIPs were obtained to evaluate the vascular anatomy. RADIATION DOSE REDUCTION: This exam was performed according to the departmental dose-optimization program which includes automated exposure control, adjustment of the mA and/or kV according to patient size and/or use of iterative reconstruction technique. CONTRAST:  75mL OMNIPAQUE IOHEXOL 350 MG/ML SOLN COMPARISON:  Chest CT dated 10/30/2021. Chest radiograph dated 08/12/2023. FINDINGS: Cardiovascular: There is no cardiomegaly or pericardial effusion. Mild atherosclerotic calcification of the thoracic aorta. No aneurysmal dilatation or dissection. Evaluation of the pulmonary arteries is limited due to respiratory motion. No pulmonary artery embolus identified. Mediastinum/Nodes: No hilar or mediastinal adenopathy. The esophagus is grossly unremarkable. No mediastinal fluid collection. Lungs/Pleura: Background of chronic interstitial coarsening and bronchiectasis. Right suprahilar/upper lobe  streaky atelectasis/scarring. No focal consolidation, pleural effusion, or pneumothorax. The central airways are patent. Upper Abdomen: No acute abnormality. Musculoskeletal: Osteopenia with degenerative changes of the spine. No acute osseous pathology. Review of the MIP images confirms the above  findings. IMPRESSION: No acute intrathoracic pathology. No CT evidence of pulmonary artery embolus. Electronically Signed   By: Elgie Collard M.D.   On: 08/13/2023 11:16   VAS [place] LOWER EXTREMITY VENOUS (DVT) Result Date: 08/12/2023  Lower Venous DVT Study Patient Name:  RUTHMARY OCCHIPINTI  Date of Exam:   08/12/2023 Medical Rec #: [id number]          Accession #:    [id number] Date of Birth: June 19, 1940          Patient Gender: F Patient Age:   83 years Exam Location:  [facility] Procedure:      VAS [place] LOWER EXTREMITY VENOUS (DVT) Referring Phys: Gilberto Stanforth --------------------------------------------------------------------------------  Indications: Swelling, Edema, Pain, and SOB.  Risk Factors: Obesity. Limitations: Body habitus and poor ultrasound/tissue interface. Comparison Study: 12/17/22 Right lower ext. Performing Technologist: Miles Sink Sturdivant-Jones RDMS, RVT  Examination Guidelines: A complete evaluation includes B-mode imaging, spectral Doppler, color Doppler, and power Doppler as needed of all accessible portions of each vessel. Bilateral testing is considered an integral part of a complete examination. Limited examinations for reoccurring indications may be performed as noted. The reflux portion of the exam is performed with the patient in reverse Trendelenburg.  +---------+---------------+---------+-----------+----------+-----------------+ RIGHT    CompressibilityPhasicitySpontaneityPropertiesThrombus Aging    +---------+---------------+---------+-----------+----------+-----------------+ CFV      Full           Yes      Yes                                     +---------+---------------+---------+-----------+----------+-----------------+ SFJ      Full                                                           +---------+---------------+---------+-----------+----------+-----------------+ FV Prox  Full                                                           +---------+---------------+---------+-----------+----------+-----------------+ FV Mid   Full                                                           +---------+---------------+---------+-----------+----------+-----------------+ FV DistalFull                                                           +---------+---------------+---------+-----------+----------+-----------------+ PFV      Full                                                           +---------+---------------+---------+-----------+----------+-----------------+  POP      Full                                                           +---------+---------------+---------+-----------+----------+-----------------+ PTV                                                   poorly visualized +---------+---------------+---------+-----------+----------+-----------------+ PERO                                                  poorly visualized +---------+---------------+---------+-----------+----------+-----------------+   +---------+---------------+---------+-----------+----------+-----------------+ LEFT     CompressibilityPhasicitySpontaneityPropertiesThrombus Aging    +---------+---------------+---------+-----------+----------+-----------------+ CFV      Full           Yes      Yes                                    +---------+---------------+---------+-----------+----------+-----------------+ SFJ      Full                                                           +---------+---------------+---------+-----------+----------+-----------------+ FV Prox  Full                                                            +---------+---------------+---------+-----------+----------+-----------------+ FV Mid   Full                                                           +---------+---------------+---------+-----------+----------+-----------------+ FV DistalFull                                                           +---------+---------------+---------+-----------+----------+-----------------+ PFV      Full                                                           +---------+---------------+---------+-----------+----------+-----------------+ POP      Full           Yes      Yes                                    +---------+---------------+---------+-----------+----------+-----------------+  PTV                                                   poorly visualized +---------+---------------+---------+-----------+----------+-----------------+ PERO                                                  poorly visualized +---------+---------------+---------+-----------+----------+-----------------+     Summary: BILATERAL: -No evidence of popliteal cyst, bilaterally. RIGHT: - There is no evidence of deep vein thrombosis in the lower extremity. However, portions of this examination were limited- see technologist comments above.  LEFT: - There is no evidence of deep vein thrombosis in the lower extremity. However, portions of this examination were limited- see technologist comments above.  *See table(s) above for measurements and observations. Electronically signed by Coral Else MD on 08/12/2023 at 10:00:59 PM.    Final    DG Chest Portable 1 View Result Date: 08/12/2023 CLINICAL DATA:  Chest pain EXAM: PORTABLE CHEST 1 VIEW COMPARISON:  Chest x-ray 07/15/2023 FINDINGS: The aorta is ectatic. The heart is enlarged. There are mild strandy and hazy opacities in the bilateral lower lobes, left greater than right. The costophrenic angles are clear. There is no pneumothorax or  acute fracture. IMPRESSION: 1. Mild strandy and hazy opacities in the bilateral lower lobes, left greater than right, which may represent atelectasis or infection. 2. Cardiomegaly. Electronically Signed   By: Darliss Cheney M.D.   On: 08/12/2023 14:50   DG Chest Port 1 View Result Date: 07/15/2023 CLINICAL DATA:  Weakness. EXAM: PORTABLE CHEST 1 VIEW COMPARISON:  Chest radiograph dated 07/01/2023. FINDINGS: The heart size and mediastinal contours are within normal limits. Streaky bibasilar opacities, slightly increased at the right lung base, with background chronic interstitial changes. No sizable pleural effusion or pneumothorax. No acute osseous abnormality. IMPRESSION: Streaky bibasilar opacities, slightly increased on the right, could reflect atelectasis or infiltrate superimposed on chronic interstitial changes. Electronically Signed   By: Hart Robinsons M.D.   On: 07/15/2023 16:36   CT ABDOMEN PELVIS W CONTRAST Result Date: 07/15/2023 CLINICAL DATA:  Left lower quadrant abdominal pain. EXAM: CT ABDOMEN AND PELVIS WITH CONTRAST TECHNIQUE: Multidetector CT imaging of the abdomen and pelvis was performed using the standard protocol following bolus administration of intravenous contrast. RADIATION DOSE REDUCTION: This exam was performed according to the departmental dose-optimization program which includes automated exposure control, adjustment of the mA and/or kV according to patient size and/or use of iterative reconstruction technique. CONTRAST:  75mL OMNIPAQUE IOHEXOL 350 MG/ML SOLN COMPARISON:  July 05, 2023. FINDINGS: Lower chest: Stable probable bibasilar scarring is noted. Hepatobiliary: No focal liver abnormality is seen. No gallstones, gallbladder wall thickening, or biliary dilatation. Pancreas: Unremarkable. No pancreatic ductal dilatation or surrounding inflammatory changes. Spleen: Normal in size without focal abnormality. Adrenals/Urinary Tract: Adrenal glands appear normal. Stable  bilateral renal cysts are noted for which no further follow-up is required. Minimal nonobstructive right nephrolithiasis is noted. No hydronephrosis or renal obstruction is noted. Urinary bladder is decompressed. Stomach/Bowel: The stomach is unremarkable. No small bowel dilatation is noted. Stable colonic dilatation is noted. Mild to moderate wall thickening of proximal transverse colon is noted which may be due to lack of distension, but inflammatory or infectious colitis  cannot be excluded. The appendix is not visualized. There is again noted abrupt caliber change involving the sigmoid colon which was present on prior exam, stricture cannot be excluded. Vascular/Lymphatic: Aortic atherosclerosis. No enlarged abdominal or pelvic lymph nodes. Reproductive: Status post hysterectomy. No adnexal masses. Other: No ascites or hernia is noted. Musculoskeletal: No acute osseous abnormality is noted. IMPRESSION: Stable probable bibasilar scarring is noted in the visualized lung bases. Minimal nonobstructive right nephrolithiasis. Continued dilatation of the colon is noted in most portions, most consistent with ileus. Mild to moderate wall thickening of proximal transverse colon is noted which may be due to lack of distension, but inflammatory or infectious colitis cannot be excluded. There is again noted abrupt caliber change involving the sigmoid colon which was present on prior exam, and stricture cannot be excluded. Aortic Atherosclerosis (ICD10-I70.0). Electronically Signed   By: Lupita Raider M.D.   On: 07/15/2023 15:36   CT Head Wo Contrast Result Date: 07/15/2023 CLINICAL DATA:  Provided history: Weakness. EXAM: CT HEAD WITHOUT CONTRAST TECHNIQUE: Contiguous axial images were obtained from the base of the skull through the vertex without intravenous contrast. RADIATION DOSE REDUCTION: This exam was performed according to the departmental dose-optimization program which includes automated exposure control,  adjustment of the mA and/or kV according to patient size and/or use of iterative reconstruction technique. COMPARISON:  Head CT 06/13/2023. FINDINGS: Brain: Cerebral atrophy. Patchy and ill-defined hypoattenuation within the cerebral white matter, nonspecific but compatible with moderate chronic small vessel ischemic disease. There is no acute intracranial hemorrhage. No demarcated cortical infarct. No extra-axial fluid collection. No evidence of an intracranial mass. No midline shift. Vascular: No hyperdense vessel. Atherosclerotic calcifications. Skull: No calvarial fracture or aggressive osseous lesion. Sinuses/Orbits: No mass or acute finding within the imaged orbits. Minimal mucosal thickening within the bilateral ethmoid and right maxillary sinuses. Other: Small-volume fluid within left mastoid air cells. IMPRESSION: 1. No evidence of an acute intracranial abnormality. 2. Moderate chronic small vessel ischemic changes within the cerebral white matter. 3. Cerebral atrophy. 4. Small left mastoid effusion. Electronically Signed   By: Jackey Loge D.O.   On: 07/15/2023 14:46    Microbiology: Results for orders placed or performed during the hospital encounter of 08/12/23  Resp panel by RT-PCR (RSV, Flu A&B, Covid) Anterior Nasal Swab     Status: None   Collection Time: 08/12/23  3:26 PM   Specimen: Anterior Nasal Swab  Result Value Ref Range Status   SARS Coronavirus 2 by RT PCR NEGATIVE NEGATIVE Final   Influenza A by PCR NEGATIVE NEGATIVE Final   Influenza B by PCR NEGATIVE NEGATIVE Final    Comment: (NOTE) The Xpert Xpress SARS-CoV-2/FLU/RSV plus assay is intended as an aid in the diagnosis of influenza from Nasopharyngeal swab specimens and should not be used as a sole basis for treatment. Nasal washings and aspirates are unacceptable for Xpert Xpress SARS-CoV-2/FLU/RSV testing.  Fact Sheet for Patients: BloggerCourse.com  Fact Sheet for Healthcare  Providers: SeriousBroker.it  This test is not yet approved or cleared by the [place] FDA and has been authorized for detection and/or diagnosis of SARS-CoV-2 by FDA under an Emergency Use Authorization (EUA). This EUA will remain in effect (meaning this test can be used) for the duration of the COVID-19 declaration under Section 564(b)(1) of the Act, 21 U.S.C. section 360bbb-3(b)(1), unless the authorization is terminated or revoked.     Resp Syncytial Virus by PCR NEGATIVE NEGATIVE Final    Comment: (NOTE) Fact Sheet for Patients:  BloggerCourse.com  Fact Sheet for Healthcare Providers: SeriousBroker.it  This test is not yet approved or cleared by the [place] FDA and has been authorized for detection and/or diagnosis of SARS-CoV-2 by FDA under an Emergency Use Authorization (EUA). This EUA will remain in effect (meaning this test can be used) for the duration of the COVID-19 declaration under Section 564(b)(1) of the Act, 21 U.S.C. section 360bbb-3(b)(1), unless the authorization is terminated or revoked.  Performed at [facility], [address], [place], [place] [address]   C Difficile Quick Screen w PCR reflex     Status: None   Collection Time: 08/13/23  6:09 AM   Specimen: STOOL  Result Value Ref Range Status   C Diff antigen NEGATIVE NEGATIVE Final   C Diff toxin NEGATIVE NEGATIVE Final   C Diff interpretation No C. difficile detected.  Final    Comment: Performed at [facility], [address], [place], [place] [address]   *Note: Due to a large number of results and/or encounters for the requested time period, some results have not been displayed. A complete set of results can be found in Results Review.    Labs: CBC: Recent Labs  Lab 08/12/23 1303 08/12/23 1727 08/13/23 0501 08/14/23 0419  WBC 11.4* 13.2* 11.4* 9.4  HGB 14.4 12.4 11.4* 10.4*  HCT 48.5* 41.5  37.4 34.2*  MCV 97.8 98.1 97.4 97.2  PLT 183 189 187 186   Basic Metabolic Panel: Recent Labs  Lab 08/12/23 1303 08/12/23 1727 08/13/23 0501 08/14/23 0419  NA 136  --  138 138  K 4.9  --  3.9 4.2  CL 103  --  108 108  CO2 20*  --  19* 24  GLUCOSE 127*  --  100* 98  BUN 31*  --  23 20  CREATININE 1.64* 1.49* 1.38* 1.07*  CALCIUM 9.1  --  8.2* 8.0*  PHOS  --   --  3.1 2.8   Liver Function Tests: Recent Labs  Lab 08/12/23 1303 08/13/23 0501 08/14/23 0419  AST 24  --   --   ALT 22  --   --   ALKPHOS 84  --   --   BILITOT 0.9  --   --   PROT 6.2*  --   --   ALBUMIN 3.2* 2.7* 2.6*   CBG: Recent Labs  Lab 08/13/23 0723 08/13/23 1125 08/13/23 1609 08/13/23 2125 08/14/23 0724  GLUCAP 97 119* 194* 120* 80    Discharge time spent: greater than 30 minutes.  Signed: Thad Ranger, MD [facility] 08/14/2023

## 2023-08-16 LAB — LEGIONELLA PNEUMOPHILA SEROGP 1 UR AG: L. pneumophila Serogp 1 Ur Ag: NEGATIVE

## 2023-08-17 DIAGNOSIS — R6521 Severe sepsis with septic shock: Secondary | ICD-10-CM | POA: Diagnosis not present

## 2023-08-17 DIAGNOSIS — I5032 Chronic diastolic (congestive) heart failure: Secondary | ICD-10-CM | POA: Diagnosis not present

## 2023-08-17 DIAGNOSIS — M069 Rheumatoid arthritis, unspecified: Secondary | ICD-10-CM | POA: Diagnosis not present

## 2023-08-17 DIAGNOSIS — J449 Chronic obstructive pulmonary disease, unspecified: Secondary | ICD-10-CM | POA: Diagnosis not present

## 2023-08-17 DIAGNOSIS — E039 Hypothyroidism, unspecified: Secondary | ICD-10-CM | POA: Diagnosis not present

## 2023-08-17 DIAGNOSIS — Z933 Colostomy status: Secondary | ICD-10-CM | POA: Diagnosis not present

## 2023-08-18 DIAGNOSIS — J189 Pneumonia, unspecified organism: Secondary | ICD-10-CM | POA: Diagnosis not present

## 2023-08-18 DIAGNOSIS — N179 Acute kidney failure, unspecified: Secondary | ICD-10-CM | POA: Diagnosis not present

## 2023-08-18 DIAGNOSIS — M069 Rheumatoid arthritis, unspecified: Secondary | ICD-10-CM | POA: Diagnosis not present

## 2023-08-18 DIAGNOSIS — I5032 Chronic diastolic (congestive) heart failure: Secondary | ICD-10-CM | POA: Diagnosis not present

## 2023-08-18 DIAGNOSIS — Z933 Colostomy status: Secondary | ICD-10-CM | POA: Diagnosis not present

## 2023-08-18 DIAGNOSIS — R079 Chest pain, unspecified: Secondary | ICD-10-CM | POA: Diagnosis not present

## 2023-08-19 DIAGNOSIS — F32A Depression, unspecified: Secondary | ICD-10-CM | POA: Diagnosis not present

## 2023-08-19 DIAGNOSIS — J449 Chronic obstructive pulmonary disease, unspecified: Secondary | ICD-10-CM | POA: Diagnosis not present

## 2023-08-19 DIAGNOSIS — M6281 Muscle weakness (generalized): Secondary | ICD-10-CM | POA: Diagnosis not present

## 2023-08-19 DIAGNOSIS — D849 Immunodeficiency, unspecified: Secondary | ICD-10-CM | POA: Diagnosis not present

## 2023-08-19 DIAGNOSIS — R2689 Other abnormalities of gait and mobility: Secondary | ICD-10-CM | POA: Diagnosis not present

## 2023-08-19 DIAGNOSIS — M069 Rheumatoid arthritis, unspecified: Secondary | ICD-10-CM | POA: Diagnosis not present

## 2023-08-19 DIAGNOSIS — Z741 Need for assistance with personal care: Secondary | ICD-10-CM | POA: Diagnosis not present

## 2023-08-19 DIAGNOSIS — J9611 Chronic respiratory failure with hypoxia: Secondary | ICD-10-CM | POA: Diagnosis not present

## 2023-08-19 DIAGNOSIS — I5032 Chronic diastolic (congestive) heart failure: Secondary | ICD-10-CM | POA: Diagnosis not present

## 2023-08-20 DIAGNOSIS — F4323 Adjustment disorder with mixed anxiety and depressed mood: Secondary | ICD-10-CM | POA: Diagnosis not present

## 2023-08-24 DIAGNOSIS — R296 Repeated falls: Secondary | ICD-10-CM | POA: Diagnosis not present

## 2023-08-24 DIAGNOSIS — Z933 Colostomy status: Secondary | ICD-10-CM | POA: Diagnosis not present

## 2023-08-25 DIAGNOSIS — L89153 Pressure ulcer of sacral region, stage 3: Secondary | ICD-10-CM | POA: Diagnosis not present

## 2023-08-26 DIAGNOSIS — R2689 Other abnormalities of gait and mobility: Secondary | ICD-10-CM | POA: Diagnosis not present

## 2023-08-26 DIAGNOSIS — F32A Depression, unspecified: Secondary | ICD-10-CM | POA: Diagnosis not present

## 2023-08-26 DIAGNOSIS — M6281 Muscle weakness (generalized): Secondary | ICD-10-CM | POA: Diagnosis not present

## 2023-08-26 DIAGNOSIS — D849 Immunodeficiency, unspecified: Secondary | ICD-10-CM | POA: Diagnosis not present

## 2023-08-26 DIAGNOSIS — I5032 Chronic diastolic (congestive) heart failure: Secondary | ICD-10-CM | POA: Diagnosis not present

## 2023-08-26 DIAGNOSIS — J449 Chronic obstructive pulmonary disease, unspecified: Secondary | ICD-10-CM | POA: Diagnosis not present

## 2023-08-26 DIAGNOSIS — J9611 Chronic respiratory failure with hypoxia: Secondary | ICD-10-CM | POA: Diagnosis not present

## 2023-08-26 DIAGNOSIS — Z741 Need for assistance with personal care: Secondary | ICD-10-CM | POA: Diagnosis not present

## 2023-08-26 DIAGNOSIS — M069 Rheumatoid arthritis, unspecified: Secondary | ICD-10-CM | POA: Diagnosis not present

## 2023-08-27 DIAGNOSIS — F4323 Adjustment disorder with mixed anxiety and depressed mood: Secondary | ICD-10-CM | POA: Diagnosis not present

## 2023-08-27 DIAGNOSIS — M545 Low back pain, unspecified: Secondary | ICD-10-CM | POA: Diagnosis not present

## 2023-08-27 DIAGNOSIS — F419 Anxiety disorder, unspecified: Secondary | ICD-10-CM | POA: Diagnosis not present

## 2023-08-27 DIAGNOSIS — G8929 Other chronic pain: Secondary | ICD-10-CM | POA: Diagnosis not present

## 2023-08-31 ENCOUNTER — Other Ambulatory Visit: Payer: Medicare Other

## 2023-09-01 DIAGNOSIS — L89153 Pressure ulcer of sacral region, stage 3: Secondary | ICD-10-CM | POA: Diagnosis not present

## 2023-09-02 DIAGNOSIS — I5032 Chronic diastolic (congestive) heart failure: Secondary | ICD-10-CM | POA: Diagnosis not present

## 2023-09-02 DIAGNOSIS — J449 Chronic obstructive pulmonary disease, unspecified: Secondary | ICD-10-CM | POA: Diagnosis not present

## 2023-09-02 DIAGNOSIS — R2689 Other abnormalities of gait and mobility: Secondary | ICD-10-CM | POA: Diagnosis not present

## 2023-09-02 DIAGNOSIS — Z741 Need for assistance with personal care: Secondary | ICD-10-CM | POA: Diagnosis not present

## 2023-09-02 DIAGNOSIS — M069 Rheumatoid arthritis, unspecified: Secondary | ICD-10-CM | POA: Diagnosis not present

## 2023-09-02 DIAGNOSIS — J9611 Chronic respiratory failure with hypoxia: Secondary | ICD-10-CM | POA: Diagnosis not present

## 2023-09-02 DIAGNOSIS — M6281 Muscle weakness (generalized): Secondary | ICD-10-CM | POA: Diagnosis not present

## 2023-09-02 DIAGNOSIS — D849 Immunodeficiency, unspecified: Secondary | ICD-10-CM | POA: Diagnosis not present

## 2023-09-02 DIAGNOSIS — F32A Depression, unspecified: Secondary | ICD-10-CM | POA: Diagnosis not present

## 2023-09-03 DIAGNOSIS — M545 Low back pain, unspecified: Secondary | ICD-10-CM | POA: Diagnosis not present

## 2023-09-07 ENCOUNTER — Encounter: Payer: Self-pay | Admitting: Internal Medicine

## 2023-09-07 ENCOUNTER — Ambulatory Visit: Payer: Medicare Other | Admitting: Internal Medicine

## 2023-09-07 NOTE — Progress Notes (Unsigned)
 Cardiology Office Note    Date:  09/10/2023  ID:  Joan, Mcdaniel 03/09/1941, MRN [id number] PCP:  Viola Greulich, MD  Cardiologist:  Lauro Portal, MD  Electrophysiologist:  None   Chief Complaint: Hospital follow up for tachycardia   History of Present Illness: .    [name] is a 83 y.o. female with visit-pertinent history of mild nonobstructive CAD, chronic diastolic heart failure, chronic dyspnea on exertion, history of PE on Eliquis , hypertension, hyperlipidemia, type 2 diabetes, CKD and ILD.  Cardiac catheterization in 2016 showed mild nonobstructive CAD, mild pulmonary hypertension, patient with history of ILD.  She has a history of prior syncope thought to be related to overmedication dehydration.  RHC in 07/2021 revealed PASP of 43/19 mean of 30, right atrial pressure 20/18 and wedge pressure of 22 mean.  Echocardiogram in 10/2021 showed EF 55 to 60%, normal LV function, no RWMA, mild LVH, G1 DD, mildly reduced RV systolic function, no significant valvular abnormalities.  CT of the chest in 6/23 revealed multiple subsegmental pulmonary emboli in the right upper and middle lobes.  She was started on Eliquis .  Venous Doppler showed no evidence of DVT.  Patient was initially planned to continue Eliquis  indefinitely however in August 2024 was admitted with GI bleed and her Eliquis  was discontinued.  Patient was last seen in clinic on 05/15/2023 by Dr. Katheryne Pane,, she was noted to have 2-3+ pitting edema, reported that she had not been taking her furosemide  as directed.  Patient was admitted from 06/13/2023 to 06/26/2023 for septic shock secondary to influenza A/RSV pneumonia and UTI.  Hospitalization was complicated by acute encephalopathy, recurrent ileus treated with Pyridostigmine  and AKI.  She was readmitted from 06/30/2023 to 07/10/23 for enterocolitis and sigmoid stricture.  She was eval by GI and general surgery.  Stricture was felt to be secondary to chronic inflammation from  diverticulitis, conservative therapy was recommended.  Patient was again admitted from 07/15/2023 for left lower quadrant pain and persistent diarrhea and ultimately underwent a diverting loop colostomy on 2/28.  FOBT was positive but she had not noted having any significant bleeding.  Inpatient colonoscopy was not recommended.  Hospitalization was complicated by hypotension secondary to hypovolemia and adrenal insufficiency/crisis treated with stress dose steroid and midodrine .  She was also noted to have a run of SVT during admission.  She was discharged back to CIR on 07/20/2023.  On 07/23/2023 cardiology was consulted for evaluation and management of tachycardia.  Patient had been tachycardic with rates in the 100s to 130s.  EKG revealed sinus tachycardia dating back to 04/2023.  Patient reported that she can feel her heart beating faster.  She also reported chest pain with radiation to the shoulder that had been intermittent for the last month or so.  She did report that it worsened when working with PT and had improvement with sublingual nitroglycerin .  EKG showed nonspecific T wave changes, high since troponin was minimally elevated and flat at 32 >> 28, not consistent with ACS.  On exam chest pain was reproducible with palpation, felt to be related to GI given recent surgeries.  Echo on 05/2023 indicated LVEF 66 5% with normal wall motion and grade 1 DD, normal RV function and no significant valvular disease.  Patient was switched from Toprol -XL to Lopressor  50 mg twice daily.  Patient was discharged on 08/14/2023 in stable condition.  Today she presents for follow-up.  She reports that she is doing better, she continues to work with  physical therapy at her facility.  She is now able to get up from her bed and walk around more, notes she is unable to go longer distances as of yet due to deconditioning.  She notes when she pushes herself too hard she will become more short of breath with some slight chest  discomfort that quickly resolves with rest.  She denies any persistent chest pain, remains on, oxygen  3 L nasal cannula.  Patient denies any further episodes of tachycardia, reports that her blood pressure and her heart rate have been well-controlled.  She denies any palpitations, presyncope, syncope.  She reports that her lower extremity edema has completely resolved.  Patient reports that her lab work has routinely been checked at her rehab facility, defers lab work today. Patient reports aside from deconditioning she reports that she is overall been doing very well.  ROS: .   Today she denies lower extremity edema, fatigue, palpitations, melena, hematuria, hemoptysis, diaphoresis, weakness, presyncope, syncope, orthopnea, and PND.  All other systems are reviewed and otherwise negative. Studies Reviewed: Aaron Aas   EKG:  EKG is ordered today, personally reviewed, demonstrating  EKG Interpretation Date/Time:  Thursday September 10 2023 10:31:27 EDT Ventricular Rate:  79 PR Interval:  152 QRS Duration:  82 QT Interval:  382 QTC Calculation: 438 R Axis:   -34  Text Interpretation: Normal sinus rhythm Left axis deviation Minimal voltage criteria for LVH, may be normal variant ( R in aVL ) Confirmed by Jeannene Tschetter [phone]) on 09/10/2023 12:35:32 PM   CV Studies: Cardiac studies reviewed are outlined and summarized above. Otherwise please see EMR for full report. Cardiac Studies & Procedures   ______________________________________________________________________________________________ CARDIAC CATHETERIZATION  CARDIAC CATHETERIZATION 08/12/2021  Conclusion Images from the original result were not included. [name] is a 83 y.o. female   [id number] LOCATION:  FACILITY: [facility] PHYSICIAN: Lauro Portal, M.D. [birth year]   DATE OF PROCEDURE:  08/12/2021  DATE OF DISCHARGE:     Right heart cath    History obtained from chart review.[name] is a 83 y.o.  moderately overweight  married [place] American female mother of 6 children, grandmother of a grandchildren referred by Dr. Bertrum Brodie for cardiovascular evaluation because of progressive dyspnea on exertion and chest pain. I last saw her in the  office 07/11/2020.Aaron Aas  She is accompanied by her daughter Aisha Hove today..  She did see Humphrey Magnuson, PA-C in the office 11/16/2019.Aaron Aas  Her cardiovascular risk factor are  notable for a strong family history of heart disease with her mother and 4 brothers old with an ischemic heart disease. She has never had a heart attack or stroke. She does have interstitial lung disease as well as rheumatoid arthritis. She is on home oxygen . She also has a brain tumor/meningioma 2 with gamma knife therapy  She's had progressive disabling dyspnea as well as daily chest pain. She underwent cardiac catheterization 10/31/14 by Dr. Jacquelynn Matter revealing normal coronary arteries and normal LV function. She did have mild pulmonary hypertension. 2-D echo showed normal LV function with grade 1 diastolic dysfunction and mild pulmonary hypertension. Her symptoms really have not changed on by mouth diuretics. She was referred to [facility] for further evaluation and treatment of interstitial lung disease. Since beginning an antibiotic and an inhaled bronchodilator her shortness of breath is somewhat improved.  She had an episode of syncope prior to seeing Humphrey Magnuson thought to be related to overmedication and dehydration from poor p.o. intake.  Her meds were adjusted.  She was also hospitalized last month with sepsis related to a tooth infection with and was in the hospital for 10 days.  She had fluid overload and has since diuresed.  She gets occasional atypical chest pain.  Her blood pressure is mildly elevated when she takes it at home.  She is on furosemide  60 mg a day.  She had been seeing one of our our Pharm.D. for medication titration for hypertension which is under better control.    She had noticed  increasing weight gain lower extremity edema as well as some atypical chest pain and left flank pain.  She did see her nephrologist who put her on antibiotic for presumed UTI and increased her diuretic from furosemide  60 to 80 mg a day.  Her symptoms sound like she has an "upper urinary tract infection" which may have exacerbated her diastolic heart failure.  Her nephrologist is following her volume status and renal function closely.  Since I saw her 6 months ago she continues to do well.  She is on oral diuretics.  Her weight is down 10 pounds since I last saw her.  She denies chest pain or shortness of breath.  Her major issue is back and leg pain.  She has been hospitalized with a UTI in the past.  She saw Dr. Bertrum Brodie in February who requested a right heart cath to further characterize her pulmonary hypertension.   PROCEDURE DESCRIPTION:  The patient was brought to the second floor [facility]iac cath lab in the postabsorptive state.  She was not premedicated .  Her right antecubital fossa was prepped and shaved in usual sterile fashion. Xylocaine  1% was used for local anesthesia. A 5 French sheath was inserted into the right antecubital Vein using standard Seldinger technique.  A 5 French balloontipped Swan-Ganz catheter was then advanced through the right heart chambers obtaining sequential pressures and pulmonary artery blood samples for the determination of Fick cardiac output.   HEMODYNAMICS:  1: Right atrial pressure-20/18 2: Right ventricular pressure-37/17, mean 22 3: Pulmonary artery pressure-43/19, mean 30 4: Pulmonary wedge pressure-A-wave 23, V wave 20, mean 22 5: Cardiac output-4.97 L/min with an index of 2.43 L/min/m 6: PVR-1.81  Impression Ms. Hosie has moderately elevated right atrial pressures and mildly elevated pulmonary artery pressures.  Her PA saturations were 58%.  The sheath was removed and a bandage was placed over the right antecubital puncture site.  The  patient left the lab in stable condition.  She will be discharged home later this morning and to follow-up with Dr. Bertrum Brodie and myself.  Lauro Portal. MD, [facility] 08/12/2021 8:06 AM     ECHOCARDIOGRAM  ECHOCARDIOGRAM COMPLETE 08/14/2023  Narrative ECHOCARDIOGRAM REPORT    Patient Name:   TOMMYE LEHENBAUER Date of Exam: 08/14/2023 Medical Rec #:  [id number]         Height:       63.0 in Accession #:    [id number]        Weight:       191.4 lb Date of Birth:  10-07-1940         BSA:          1.898 m Patient Age:    82 years          BP:           113/69 mmHg Patient Gender: F                 HR:  80 bpm. Exam Location:  Inpatient  Procedure: 2D Echo, Cardiac Doppler, Color Doppler and Intracardiac Opacification Agent (Both Spectral and Color Flow Doppler were utilized during procedure).  Indications:    Elevated troponin  History:        Patient has prior history of Echocardiogram examinations, most recent 06/15/2023. Arrythmias:Tachycardia, Signs/Symptoms:Dyspnea and Chest Pain; Risk Factors:Hypertension, Diabetes, Dyslipidemia and Non-Smoker. DVT.  Sonographer:    Juanita Shaw Referring Phys: Hurman Maiden RAI   Sonographer Comments: Image acquisition challenging due to respiratory motion. IMPRESSIONS   1. Left ventricular ejection fraction, by estimation, is 55 to 60%. The left ventricle has normal function. The left ventricle has no regional wall motion abnormalities. Left ventricular diastolic parameters were normal. 2. Right ventricular systolic function is normal. The right ventricular size is normal. 3. The mitral valve is grossly normal. No evidence of mitral valve regurgitation. No evidence of mitral stenosis. 4. The aortic valve was not well visualized. Aortic valve regurgitation is not visualized. Aortic valve sclerosis is present, with no evidence of aortic valve stenosis. 5. The inferior vena cava is normal in size with greater than 50% respiratory  variability, suggesting right atrial pressure of 3 mmHg.  FINDINGS Left Ventricle: Left ventricular ejection fraction, by estimation, is 55 to 60%. The left ventricle has normal function. The left ventricle has no regional wall motion abnormalities. The left ventricular internal cavity size was normal in size. There is no left ventricular hypertrophy. Left ventricular diastolic parameters were normal.  Right Ventricle: The right ventricular size is normal. No increase in right ventricular wall thickness. Right ventricular systolic function is normal.  Left Atrium: Left atrial size was normal in size.  Right Atrium: Right atrial size was normal in size.  Pericardium: There is no evidence of pericardial effusion.  Mitral Valve: The mitral valve is grossly normal. No evidence of mitral valve regurgitation. No evidence of mitral valve stenosis. MV peak gradient, 1.7 mmHg. The mean mitral valve gradient is 1.0 mmHg.  Tricuspid Valve: The tricuspid valve is not well visualized. Tricuspid valve regurgitation is not demonstrated. No evidence of tricuspid stenosis.  Aortic Valve: The aortic valve was not well visualized. Aortic valve regurgitation is not visualized. Aortic valve sclerosis is present, with no evidence of aortic valve stenosis. Aortic valve mean gradient measures 2.0 mmHg. Aortic valve peak gradient measures 2.9 mmHg. Aortic valve area, by VTI measures 1.92 cm.  Pulmonic Valve: The pulmonic valve was not well visualized. Pulmonic valve regurgitation is not visualized. No evidence of pulmonic stenosis.  Aorta: The aortic root and ascending aorta are structurally normal, with no evidence of dilitation.  Venous: The inferior vena cava is normal in size with greater than 50% respiratory variability, suggesting right atrial pressure of 3 mmHg.  IAS/Shunts: The interatrial septum was not well visualized.   LEFT VENTRICLE PLAX 2D LVIDd:         4.30 cm     Diastology LVIDs:          2.70 cm     LV e' medial:    7.51 cm/s LV PW:         0.60 cm     LV E/e' medial:  8.2 LV IVS:        0.70 cm     LV e' lateral:   6.85 cm/s LVOT diam:     1.70 cm     LV E/e' lateral: 8.9 LV SV:         27 LV SV Index:  14 LVOT Area:     2.27 cm  LV Volumes (MOD) LV vol d, MOD A2C: 57.5 ml LV vol d, MOD A4C: 77.1 ml LV vol s, MOD A2C: 22.5 ml LV vol s, MOD A4C: 25.2 ml LV SV MOD A2C:     35.0 ml LV SV MOD A4C:     77.1 ml LV SV MOD BP:      43.9 ml  RIGHT VENTRICLE             IVC RV Basal diam:  2.80 cm     IVC diam: 1.00 cm RV Mid diam:    2.60 cm RV S prime:     10.60 cm/s TAPSE (M-mode): 2.2 cm  LEFT ATRIUM             Index       RIGHT ATRIUM          Index LA diam:        1.60 cm 0.84 cm/m  RA Area:     5.43 cm LA Vol (A2C):   8.0 ml  4.24 ml/m  RA Volume:   7.47 ml  3.94 ml/m LA Vol (A4C):   12.9 ml 6.80 ml/m LA Biplane Vol: 10.7 ml 5.64 ml/m AORTIC VALVE                    PULMONIC VALVE AV Area (Vmax):    2.11 cm     PV Vmax:       0.60 m/s AV Area (Vmean):   1.88 cm     PV Peak grad:  1.4 mmHg AV Area (VTI):     1.92 cm AV Vmax:           84.90 cm/s AV Vmean:          59.300 cm/s AV VTI:            0.138 m AV Peak Grad:      2.9 mmHg AV Mean Grad:      2.0 mmHg LVOT Vmax:         79.00 cm/s LVOT Vmean:        49.000 cm/s LVOT VTI:          0.117 m LVOT/AV VTI ratio: 0.85  AORTA Ao Root diam: 3.20 cm Ao Asc diam:  3.00 cm  MITRAL VALVE MV Area (PHT): 3.56 cm    SHUNTS MV Area VTI:   1.83 cm    Systemic VTI:  0.12 m MV Peak grad:  1.7 mmHg    Systemic Diam: 1.70 cm MV Mean grad:  1.0 mmHg MV Vmax:       0.65 m/s MV Vmean:      47.9 cm/s MV Decel Time: 213 msec MV E velocity: 61.30 cm/s MV A velocity: 60.80 cm/s MV E/A ratio:  1.01  Sunit Tolia Electronically signed by Olinda Bertrand Signature Date/Time: 08/14/2023/8:16:21 PM    Final           ______________________________________________________________________________________________       Current Reported Medications:.    Current Meds  Medication Sig   acetaminophen  (TYLENOL ) 500 MG tablet Take 1,000 mg by mouth every 8 (eight) hours as needed (pain).   albuterol  (VENTOLIN  HFA) 108 (90 Base) MCG/ACT inhaler Inhale 2 puffs into the lungs in the morning and at bedtime.   atorvastatin  (LIPITOR) 10 MG tablet Take 1 tablet (10 mg total) by mouth daily. (Patient taking differently: Take 10 mg by mouth at bedtime.)   azithromycin  (  ZITHROMAX ) 250 MG tablet One tab m-wed -friday   bisacodyl  (DULCOLAX) 10 MG suppository Place 10 mg rectally daily as needed (bowel management).   Cholecalciferol  (VITAMIN D3) 50 MCG (2000 UT) TABS Take 2,000 Units by mouth daily.   diclofenac  Sodium (VOLTAREN ) 1 % GEL Apply 2 g topically 3 (three) times daily.   DULoxetine  (CYMBALTA ) 60 MG capsule Take 1 capsule (60 mg total) by mouth daily.   furosemide  (LASIX ) 40 MG tablet Take 1 tablet (40 mg total) by mouth daily.   gabapentin  (NEURONTIN ) 300 MG capsule Take 1 capsule (300 mg total) by mouth 2 (two) times daily.   guaiFENesin  (MUCINEX ) 600 MG 12 hr tablet Take 2 tablets (1,200 mg total) by mouth 2 (two) times daily.   hydroxychloroquine  (PLAQUENIL ) 200 MG tablet Take 1 tablet (200 mg total) by mouth daily.   leflunomide  (ARAVA ) 20 MG tablet Take 1 tablet (20 mg total) by mouth daily.   leptospermum manuka honey (MEDIHONEY) PSTE paste Apply 1 Application topically daily. Interchangeable with TheraHoney Apply thin layer (3 mm) to wound.   levothyroxine  (SYNTHROID ) 50 MCG tablet Take 1 tablet (50 mcg total) by mouth daily. (Patient taking differently: Take 50 mcg by mouth daily before breakfast.)   lidocaine  (LIDODERM ) 5 % Place 1 patch onto the skin daily. Remove & Discard patch within 12 hours or as directed by MD   magnesium  hydroxide (MILK OF MAGNESIA) 400 MG/5ML suspension Take 30 mLs by mouth  daily as needed (no BM in 3 days).   Magnesium  Oxide 250 MG TABS Take 1 tablet (250 mg total) by mouth daily.   methocarbamol  (ROBAXIN ) 500 MG tablet Take 1 tablet (500 mg total) by mouth every 6 (six) hours as needed for muscle spasms (pain).   METOPROLOL  TARTRATE PO Take 62.5 mg by mouth every 12 (twelve) hours.   morphine  (MS CONTIN ) 15 MG 12 hr tablet Take 1 tablet (15 mg total) by mouth every 12 (twelve) hours.   Multiple Vitamin (MULTIVITAMIN) tablet Take 1 tablet by mouth daily.   nitroGLYCERIN  (NITROSTAT ) 0.4 MG SL tablet Place 1 tablet (0.4 mg total) under the tongue every 5 (five) minutes as needed for chest pain.   OXYGEN  Inhale 3 L/min into the lungs at bedtime.   pantoprazole  (PROTONIX ) 40 MG tablet Take 1 tablet (40 mg total) by mouth 2 (two) times daily. (Patient taking differently: Take 40 mg by mouth daily before breakfast.)   polyethylene glycol powder (GLYCOLAX /MIRALAX ) 17 GM/SCOOP powder Take 17 g by mouth daily as needed (bowel management).   potassium chloride  SA (KLOR-CON  M) 20 MEQ tablet Take 2 tablets (40 mEq total) by mouth 2 (two) times daily.   predniSONE  (DELTASONE ) 10 MG tablet Take 1 tablet (10 mg total) by mouth daily with breakfast. (Patient taking differently: Take 10 mg by mouth at bedtime.)   senna (SENOKOT) 8.6 MG TABS tablet Take 17.2 mg by mouth daily as needed (bowel management).   Physical Exam:    VS:  BP 130/74   Pulse 79   Ht 5\' 4"  (1.626 m)   Wt 188 lb (85.3 kg)   SpO2 100%   BMI 32.27 kg/m    Wt Readings from Last 3 Encounters:  09/10/23 188 lb (85.3 kg)  08/12/23 191 lb 5.8 oz (86.8 kg)  08/04/23 191 lb 5.8 oz (86.8 kg)    GEN: Well nourished, well developed in no acute distress NECK: No JVD; No carotid bruits CARDIAC: RRR, no murmurs, rubs, gallops RESPIRATORY:  Clear to auscultation  without rales, wheezing or rhonchi  ABDOMEN: Soft, non-tender, non-distended EXTREMITIES:  No edema; No acute deformity     Asessement and Plan:.     Mild nonobstructive CAD: Patient with history of atypical chest pain.  LHC in 2016 showed nonobstructive CAD.  During hospital admission reported chest pain that was felt to be epigastric in nature and intermittent for the last month or so with all GI issues.  Workup reassuring and not consistent with ACS, pain was reproducible with palpation.  EKG today indicates normal sinus rhythm at 79 bpm, no ischemic changes.  Patient reports that she is still struggling with deconditioning, if she pushes herself too hard she becomes increasingly short of breath with some slight chest discomfort that resolves with rest, patient notes that for similar to hospital presentation.  She notes that she does not have chest discomfort with regular activities or mild exertion.  Discussed with patient and through shared decision making we will not pursue any ischemic evaluation at this time.  Reviewed ED precautions.  Continue Lipitor 10 mg daily, Lasix  40 mg daily and metoprolol  tartrate.  Tachycardia: Patient noted to have heart rates between 110 bpm to 130 bpm while admitted.  Patient had improvement with increased doses of metoprolol .  Today she reports that her heart rate has returned to her baseline, EKG indicates sinus rhythm at 79 bpm.  Patient denies any palpitations or feeling of increased heart rates.  Continue metoprolol .  Chronic diastolic HF: Echo in 05/2023 showed LVEF 60 to 65% with normal wall motion and grade 1 diastolic dysfunction, normal RV function and no significant valvular disease. Today she appears euvolemic and well compensated on exam.  Continue Lasix  40 mg daily.  History of DVT/PE: Patient previously with PE and DVT in early 2024.  Eliquis  discontinued given recurrent GI bleeding.  Hypertension: Blood pressure today 130/74.  Continue metoprolol  and Lasix .  Hyperlipidemia: Last lipid profile on 09/29/2022 indicated total cholesterol 146, HDL 42, triglycerides 162 and LDL 76.  Patient deferred lab  work today, notes that it is routinely being checked at rehab and she plans to follow-up with her PCP.  Type 2 diabetes mellitus: Last hemoglobin A1c 5.6 on 06/14/2023.  Monitored and managed per PCP.  ILD: On oxygen , 3 L , followed by pulmonology.  She notes increased shortness of breath with increased exertion related to deconditioning.  Patient notes that she missed an appointment pulmonology last week, plans to follow-up.  CKD stage II: Last creatinine 1.07 on 08/14/2023.  Patient reports that her lab work has been routinely checked at her rehab facility and has been stable.   Disposition: F/u with Dr. Katheryne Pane in 3 months or sooner if needed.   Signed, Nicky Kras D Tranise Forrest, NP

## 2023-09-07 NOTE — Progress Notes (Deleted)
 OV 01/03/2015  Chief Complaint  Patient presents with   Follow-up    Pt c/o DOE, resolving cough that started over the weekend, chest discomfort when SOB and when lying down. Pt c/o increasing fatigue.    She has severe class 3 dyspnea with hypoxemia on account of BMI 50, diaast dsyfn, chronic pain, deconditioning and ILD due to RA on celcept/pred/bactrim    I personally last saw her in march 2-016. Since then has seen other provicers in this office and most recently in June 2016. In June 2016 admitted for acute CHF - cath showed mild pulm htn only with PA mean 24 and minimal CAD (reviewed chart of procedures June 2016). Dx as acute diast chf. Since then better. SHe had fu HRCT 11/29/14 that shows progression in ILD since march 2016 and now with UIP pattern (personally visualized image). She is finally accepting of fact that ILD is not makin cause of dyspnea and obesity is main problem. She is wondering about dc cellcept /pred/bactrim  so as to minimize polypharmacy and side effect profile    OV 02/09/2015  Chief Complaint  Patient presents with   Follow-up    Pt states her breathing is doing well the past few days. Pt recently went to [facility] ED for CP. Pt c/o prod cough with tan mucus. Pt denies CP/tightness    She has severe class 3 dyspnea with hypoxemia on account of BMI 50, diaast dsyfn, chronic pain, deconditioning and ILD due to RA. She is having progressive ILD. But given her miserable quality of life and polypharmacy and other side effects we decided to stop the CellCept  and Bactrim . We also advise prednisone  taper. This was all in the end of August 2016. But on 01/27/2015 she ended up in the emergency room with some respiratory difficulty. Her prednisone  was bumped up. She subsequently saw her rheumatologist Dr. Baker Bon was now tapering her prednisone  again. There is also some concerns of headaches and possibly temporal arteritis based on her description and apparently some kind of a biopsy  scheduled. She feels that since stopping her CellCept  and Bactrim  her cough is returned. Nevertheless weight and knee pain and back pain continued to be major issues for her along with polypharmacy. Her Polysorb was includes MS Contin   [name]    OV 02/21/2015  - a ACUTE VISIT  Chief Complaint  Patient presents with   Acute Visit    Pt c/o of chest tightness, productive cough with white/yellow mucus, and wheezing. Pt also c/o of feeling abdominal swelling/distention. Pt also c/o of constant fatigue. Pt states symptoms have been present x1 week.     Acute visit for this morbidly obese, rheumatoid arthritis patient with ILD who is now off CellCept   I saw her as recently as 02/09/2015. She reports that for the past 1 week she's having increased chest congestion, chest tightness, wheezing, shortness of breath, cough and yellow  sputum more than baseline. Symptoms are rated as moderate in severity. Relieved by inhaler. She has tried Mucinex  for 1 week with some initial partial relief but symptoms progressed. So she made an acute visit.  She continues to stay off CellCept . She is not on prednisone  10 mg per day. She tells me Dr. Baker Bon has held her prednisone  at the dose because of concerns of temporal arteritis but apparently now that his diagnoses being eliminated in the differential diagnosis. She will see Dr. Baker Bon in the next few weeks to decide on further  prednisone  taper.    OV 04/16/2015  Chief Complaint  Patient presents with   Follow-up    Pt states she felt much better after completing the abx and pred. Pt states her SOB is at basline, prod cough with yellow mucus at times, pt c/o chest tightness.     Morbidly obese female with rheumatoid arthritis with interstitial lung disease UIP pattern with associated deconditioning and diastolic dysfunction  She continues to remain off CellCept . Last seen in October 2016. At that time I discharged her from the office with  doxycycline  and prednisone  taper. After that early November 2016 she was given another course of Doxy and prednisone  burst. This did not help. Sputum culture at this time showed normal flora. She called again with the middle of November was given Levaquin  and prednisone  burst. She feels Levaquin  helped her immensely. At this point in time she is reporting progressive dyspnea and also worsening arthritis. She wants to go back on immunomodulators again. She has discussed Imuran with Dr. Baker Bon. She is due to see Dr. Baker Bon tomorrow. She also feels that she is unable to come off prednisone . She feels at a minimum she needs 10 mg of prednisone  per day. She will discuss this with Dr. D her rheumatologist tomorrow. She's also interested in a second opinion at [facility] interstitial lung disease clinic which I did discuss with her most recently. She continues to use oxygen .     OV 07/17/2015  Chief Complaint  Patient presents with   Follow-up    Pt states that she has improved. Pt c/o continued SOB with exertion, some ocugh and wheeze, and some intermittent chest tightness. Pt states that she does sleep a lot. Pt states that her heart rate remains high and that cardiology said it was due to her lungs. Pt is currently on Lasix , 80mg , but still seems to retain a lot of fluid and is not seeming to go to the bathroom as frequently.    Follow-up interstitial lung disease secondary to rheumatoid arthritis in the setting of morbid obesity, poor quality of life and heavy chronic pain opioid use and depressive symptoms  Last seen November 2016. Since then she has seen Dr. Baker Bon the rheumatology clinic. She was given instructions on Imuran which she has red but she has not decided whether she should take it or not. She subsequently has followed up with [facility] interstitial lung disease clinic. CT scan of the chest reviewed result shows possible UIP pattern 06/12/2015. She had autoimmune profile which was strongly positive for both rheumatoid  factor and cyclic citrulline peptide both markers for rheumatoid arthritis. She did see Dr. Craige Dixon B on 07/02/2015. It appears that the note is not complete but according to the patient was supposed to be a multidisciplinary conference and they will get back to her. It is possible a second immunomodulators agent and is being contemplated but patient is leery of this given prior issues with opportunistic infection not otherwise specified and admissions to the hospital. In the interim she is also seen cardiology Janr 2017 Dr. Katheryne Pane who did not feel dyspnea was cardiac related. I reviewed his note  OV 10/17/2015  Chief Complaint  Patient presents with   Follow-up    Pt reports she feels improved. Pt c/o continued SOB with exertion, some wheeze and occasional cough. Pt denies CP/tightness.     Follow-up interstitial lung disease secondary to rheumatoid arthritis in the setting of morbid obesity, poor quality of life and heavy chronic pain opioid use  and depressive symptoms   Since seeing me last in February 2017. She followed up at [facility] again pulmonary clinic in March 2017. I reviewed those notes. She saw Dr. Craige Dixon B on 07/31/2015. Due to the presence of bronchiectasis she's been started on  azithromycin  3 times a week. She says this has helped. In addition due to physical deconditioning and obesity she is getting home physical therapy is also help. She is interested in pulmonary rehabilitation although given obesity and back pain can be challenging. She nevertheless wants to try. She is on daily prednisone  10 mg a day. She met with Dr. Baker Bon in the rheumatology clinic and has been referred now to [facility] rheumatology clinic for second opinion for a second immunomodulators. She continues to be morbidly obese and has been losing weight. She is somewhat interested in visiting with a [facility] for weight loss.   OV 02/18/2016  Chief Complaint  Patient presents with   Follow-up     Pt states she did not go to pulm rehab d/t BIL knee pain. Pt denies change in SOB, significant cough, CP/tightness and f/c/s. Pt states she is exercising at home. pt states she is doing well overall.    Problem List: 1.  Extensive bronchiectasis - cylindrical vs. Traction - has been present and not significantly changed from 2014 to 2017                       - has history of hospitalization for what sounds like pneumonia vs. Bronchiectasis flare as far back as 1999; underwent bronchoscopy which made her feel "like a new woman" in 1999 2.  Pulmonary fibrosis - basilar predominant reticular opacities that have not change from 2014 to 2017 3.  Rheumatoid arthritis: RF 959 ; CCP >300                       - has been treated with methotrexate , biologics, etc in the past - none in the past several years                       - currently on 7.5/5mg  alter day 4.  Morbid obesity with deconditioning - has had several hospitalizations in recent months; very deconditioned at present time  - [facility] oct 2017     Followup for above  - She now follows at [facility] rheumatology and pulmonary clinics. Last seen by pulmonologist and rheumatologist at [facility] 01/28/2016. Review of the notes suggest that they have her on a slightly lower dose of prednisone  with intent to taper to 5 mg per day. He also have her on hydroxychloroquine . No further disease modifying agents are being considered for rheumatoid arthritis. In terms of her interstitial lung disease and bronchiectasis she's on conservative therapy along with Activella device. This is helping and she feels better. Today she came in walking with her walker. This the happiest I have f seen her. She's not lost any weight but she learned in the [employer] lifestyle clinic. She tried to go to pulmonary rehabilitation but could not because of knee pain. She is up-to-date with pneumonia vaccine. She's had a flu shot this  season.    OV 03/18/2016  Chief Complaint  Patient presents with   Acute Visit    Pt states breathing is not that bad today. Pt states she has cogestion build up, no tightness, SOB w/o excertion. Pt states breathing  has been better since last visit except the mucus. No cough feels the need to cough but can not.      follow-up bronchiectasis and immunosppessive thrapy. Visit 3 month olow-up. Body mssindex is stll very high but she says she has lost weight. Overal well. Last few days increasd symptoms of mucus, cough and subjective unwellness. No fever or colored sputum. But she feells she needs antibiotic and prednisone   OV 06/23/2016  Chief Complaint  Patient presents with   Follow-up    Pt states her SOB has been doing well but she has been more fatigued. Pt has an occ prod cough with light yellow mucus and chest tightness when SOB - resolves with rest.    Follow-up bronchiectasis with interstitial lung disease on immunosuppressive therapy due to rheumatoid arthritis. Last visit 01/17/2016. Since then she's lost 5 pounds of weight according to history. . She is here with her daughter. No acute bronchitis symptoms. Overall stable. The main issues that she's having increased joint pain and she is only on plaquenil . No fever or colored sputum.   OV 02/16/2017  Chief Complaint  Patient presents with   Follow-up    Pt states breathing has been okay. Stated she had a URI x1 month ago which she developed while in rehab after hip fx. Occ. SOB and occ. chest tightness. Denies any cough at this time.    Follow-up bronchiectasis with interstitial lung disease on immunosuppressive therapy due to rheumatoid arthritis. Last visit was 6 months ago approximately. Since then she has continued to lose weight through dietary control. She feels better. However in the summer 2018 she fell down due to orthostasis and fractured her left hip. This wasn't complicated by MRSA infection according to her history.  She is now undergoing physical therapy. Overall she's feeling well. She is very proud of her weight loss. Dyspnea is improved. She missed [facility] appointment for ILD clinic but she will make this again. She uses oxygen  with heavy exertion at physical therapy and at night.  OV 02/11/2018  Subjective:  Patient ID: [name], female , DOB: January 19, 1941 , age 11 y.o. , MRN: [id number] , ADDRESS: [address] [id number] [place] [address]   02/11/2018 -   Chief Complaint  Patient presents with   Follow-up    Pt states she has been doing okay since last visit. States she has had some pain in the mid region of her chest x3 weeks and has also been having problems with fatigue. States she has had some occ coughing. Her main complaints are the pain in her chest and the exhaustion.     HPI [name] 83 y.o. -follow-up ILD with bronchiectasis in the setting of rheumatoid arthritis and obesity and physical deconditioning  It is almost a year since I last saw her.  Since then her daughter died in [place]  with advanced sarcoma.  She is getting grief counseling from hospice.  She also has palliative care support at home.  She also in the last year has fallen and hurt her back.  She is on attending physical therapy which is helping her.  She says she is lost 30 pounds in the last 1 year.  Because of all this functional status is somewhat better but still she is extremely dyspneic.  In fact when she walked 185 feet x 3 laps on room air using a walker: She is only able to complete a lap and a half and stop because of extreme shortness of breath.  Her resting pulse ox is 98%.  Final pulse ox was 98%.  And her heart rate went from 107/min to 140/min.  She has not yet had a flu shot.  Of note she is on prednisone  15 mg/day which has helped her arthritis.  She is asking if it is okay to go back down to 10 mg/day from a lung standpoint.  She is also on a different arthritis immunomodulator which is helped her  rheumatoid arthritis pain.  She gets this treatment from [facility] rheumatology.  Pulmonary ILD medications also being managed at [facility].      OV 02/10/2019  Subjective:  Patient ID: [name], female , DOB: 28-Dec-1940 , age 36 y.o. , MRN: [id number] , ADDRESS: [address] [place] [address] [name]. -follow-up ILD with bronchiectasis in the setting of rheumatoid arthritis and obesity and physical deconditioning  Problem List - copy and paste from [facility] - DR Craige Dixon B: 1.  Extensive bronchiectasis - cylindrical vs. Traction - has been present and not significantly changed from 2014 to 2017                       - has history of hospitalization for what sounds like pneumonia vs. Bronchiectasis flare as far back as 1999; underwent bronchoscopy which made her feel "like a new woman" in 1999 2.  Pulmonary fibrosis - basilar predominant reticular opacities that have not change from 2014 to 2017 3.  Rheumatoid arthritis: RF 959 ; CCP >300                       - has been treated with methotrexate , biologics, etc in the past - none in the past several years                       - currently on 10 mg prednisone  daily 4.  Morbid obesity with deconditioning - has had several hospitalizations in recent months; very deconditioned at present time   02/10/2019 -   Chief Complaint  Patient presents with   Follow-up    Pt here for ILD follow up. Pt states her breathing is unchanged since last OV in 01/2018. Pt denies new symptoms. Pt denies CP/tightness and f/s/c.      HPI [name] 83 y.o. -returns for follow-up.  I personally not seen her in 1 year.  In the interim she is kept up with follow-up at [facility].  In January 2020 she suffered from left lower extremity DVT after having shortness of breath.  She was admitted at [facility] and started on Eliquis .  She still continues with Eliquis .  She tells me that Xeljanz  was  considered as the cause although she does have obesity and sedentary lifestyle and venous incompetency.  It appears this was her first DVT.  She has had a CT angiogram chest in 2005 that was negative for PE and a Doppler ultrasound in 2016 and 2018 that was negative for DVT.  She says a cardiologist here locally is managing her Eliquis  and plans to stop it after several months.  In terms of her rheumatoid arthritis she is now under the care of [facility] rheumatology.  They just having her on prednisone .  In July 2020 she also saw Dr. Geraldene Kleine pulmonary at [facility].  Apparently a walk test could not be performed.  She says she has lost weight.  Her main  complaint is exertional dyspnea.  She wants to do portable oxygen .  She is only on nighttime oxygen .  In the past she always gets dyspneic way before she even desaturates a little bit this is on account of her obesity and deconditioning.  Her son is here with with her today.  This the first time I am meeting him.  He tells me that her breathing at night is rough although not as bad as his dad sleep apnea.  Patient is not on CPAP.  He is also curious about qualifying oxygen  with walking.  Walking desaturation test for an 85 feet x 3 laps: She basically walked only half a lap.  Resting pulse ox was 97% with a heart rate of 102.  Final pulse ox was 96% with a heart rate of 122 and she stopped because of dyspnea.  She had a most recent CT chest there is resulted below and it is stable.   IMPRESSION: CT Chest high resolution 1. Mid and lower lung zone predominant bronchiectasis, peribronchovascular ground-glass and scattered subpleural reticular densities, grossly stable from 11/29/2014. Findings are nonspecific and can be seen with nonspecific interstitial pneumonitis. Usual interstitial pneumonitis is considered less likely but respiratory motion and expiratory phase imaging make assessment difficult. 2. Coronary artery calcification.     Electronically  Signed   By: Shearon Denis M.D.   On: 11/24/2018 13:04  ROS - per HPI  Results for OCIA, SIMEK" (MRN [id number]) as of 02/10/2019 14:47  Ref. Range 04/17/2015 18:02 04/18/2015 13:30  D-Dimer, Vinton Greig Latest Ref Range: 0.00 - 0.50 ug/mL-FEU 2.57 (H) 2.54 (H)   OV 06/21/2019  Subjective:  Patient ID: [name], female , DOB: Jun 16, 1940 , age 37 y.o. , MRN: [id number] , ADDRESS: [address] [place] [address]   06/21/2019 -   Chief Complaint  Patient presents with   Follow-up    Pt states she was recently admitted to hospital at [facility] for 10 days. Since then, pt has been  having some low O2 sats and issues with SOB. Pt states she is better now than she was.   Milta F Mcdaniel. -follow-up ILD with bronchiectasis in the setting of rheumatoid arthritis and obesity and physical deconditioning  Problem List - copy and paste from [facility] - DR Craige Dixon B: 1.  Extensive bronchiectasis - cylindrical vs. Traction - has been present and not significantly changed from 2014 to 2017                       - has history of hospitalization for what sounds like pneumonia vs. Bronchiectasis flare as far back as 1999; underwent bronchoscopy which made her feel "like a new woman" in 1999 2.  Pulmonary fibrosis - basilar predominant reticular opacities that have not change from 2014 to 2017 and in dec 2020 CT chest 3.  Rheumatoid arthritis: RF 959 ; CCP >300                       - has been treated with methotrexate , biologics, etc in the past - none in the past several years                       - currently on 10 mg prednisone  daily 4.  Morbid obesity with deconditioning - has had several hospitalizations in recent months; very deconditioned at present time   HPI [name] 83 y.o. -admitted  approximately 2 months ago early December 2020 with Klebsiella pansensitive urinary tract infection and bilateral lower extremity cellulitis.  Discharge on 2 L nasal  cannula.  Recommendations for home physical therapy were made.  Bilateral Doppler extremities were negative in the hospital.  Her CODE STATUS is full.  She returns for routine follow-up.  Her daughter is with her.  She continues to lose weight.  She has lost around 40 to 50 pounds.  She says she is on the medication for rheumatoid arthritis I do not know which one.  She says her cellulitis was quite bad and she is suffered from it.  But nevertheless with the weight loss her hypoxemia and overall joint pain is better.  She still has significant pedal edema and erythema in her lower extremities.  She is asking for some advice with that.  Otherwise no issues.  Using 2 L nasal cannula at night.  Walks around with a walker.  Symptom score is listed b       11/22/2020- Interim hx  with NP Patient presents today for 2 week follow-up PNA. She had a CXR that showed slight increase in opacity right lung base, questionable infiltrate. Treated with Augmentin  1 tab BID x 10 days. She is feeling a great deal better. She no longer has right sided pain. She still has some wheezing and cough with yellow mucus. She is taking muciex 1,200mg  twice a day. She is using her flutter valve 3-4 times a day. She has been using nebulizer twice a day. She takes 10mg  prednisone  daily. She is on Toprol  XL 75mg , she has not taken this yet today. She takes Azithromycin  MWF pulmonary with [facility]. She can resume this. Needs repeat CXR today.  TEST/EVENTS : High-resolution CT chest November 24, 2018 mid to lower lung bronchiectasis, groundglass reticular densities stable since 2016  OV 12/10/20 at [facility]\  Interval History:  Has been doing ok until about a month ago.  Had pneumonia on the right side a month ago. Even now, sometimes she feels that she has pain in her R chest - upper front chest. Even sometimes today, it feels kind of bad.   She was doing a lot of mucus production. They she started hurting badly on the right side.  Thought she was sleeping too much. Did not pay it any mind. Went to see the doctor and she told them about the pain. They did an xray and diagnosed with pneumonia. She did not have any fever or chills - but she says that she very seldom in her life has had temp >99. She took antibiotics. Follow up xray revealed clearing. She was put on prednisone  40 mg with taper. She still gets kind of hoarse and has mucus in her upper throat. She is scheduled now for surgery for cataracts on August 9th.   She has started getting joint pain back in her knees again.   She was out of azithromycin . She has had a hard time getting this from the [place]. Spoke with triage nurse. Needs a 90 day supply.   Using Acapella. She has been using her albuterol  nebulizer at home. She still has a lot of wheezing. This wheezing sometimes clears with coughing mucus, sometimes does not. Unsure if this gets better with the nebulizer. Happens more when she is laying down.   At the local pulmonologist, she needed oxygen  and now has oxygen  at home. Using [employer]. She is using 2L with (sometimes 3L). This is with laying down.  When walking and moving, she keeps it on 2L.   Mucus production is getting better. She is using the acapella three or four times per day. She is using the nebulizer twice per day.   Has R chest discomfort which feels a bit worse today than prior. She says that it feels like a strain and it affects her voice. When she talks, it is like it is pulling or straining something.   Has help now every day at home. Son has been on her case about that.   Is on 10 mg prednisone  - and has been on this dose for about a week.   Outside echo 06/04/2018: Study Conclusions   - Left ventricle: The cavity size was normal. There was mild focal    basal hypertrophy of the septum. Systolic function was normal.    The estimated ejection fraction was in the range of 60% to 65%.    Wall motion was normal; there were no regional wall motion     abnormalities.  - Right ventricle: The cavity size was mildly dilated. Wall    thickness was normal.  - Pulmonary arteries: Systolic pressure was mildly increased.   Echo pharm stress test with contrast 12/15/2018:  NORMAL STRESS TEST. NORMAL RESTING STUDY WITH NO WALL MOTION ABNORMALITIES AT REST AND PEAK STRESS. NO DOPPLER PERFORMED FOR VALVULAR REGURGITATION NO DOPPLER PERFORMED FOR VALVULAR STENOSIS NORMAL RESTING BP - APPROPRIATE RESPONSE  Cath done on 10/31/14:  Mild CAD without significant obstructive disease. Ectasia in all three  major proximal vessels.  Mild pulmonary hypertension. Aortic saturation 87%. Pulmonary artery  saturation 60%. Cardiac output 7.6 L. Cardiac index 3.3.  Continue medical therapy for lung disease. Continue preventative therapy  for her heart disease.Addendum by Lucendia Rusk, MD on 10/31/2014 12:01 PM  Mild CAD without significant obstructive disease. Ectasia in all three  major proximal vessels.  Mild pulmonary hypertension. Aortic saturation 87%. Pulmonary artery  saturation 60%. Cardiac output 7.6 L. Cardiac index 3.3.  Current Functional Status:  Oxygen : uses 2-3L of oxygen  at home PRN and with sleep.  Level of exertion that leads to dyspnea: Walking any distance makes her dyspneic.   Rehab: using the walker most of the time. When she goes out, she uses a wheelchair. Home PT was helpful. She had a problem getting in and out of vehicle and bed.  Laboratory Data/Test Results: Pulmonary Function Test (PFT) Latest Ref Rng & Units 07/02/2015 12/14/2017  FVC PRE L 1.78 1.58  FVC % PRE PRED % 63 57  FEV1 PRE L 1.49 1.33  FEV1 % PRE PRED % 70 65  FEV1/FVC PRE % 83.74 84.24  FEF25-75% PRE L/s 2.83 2.39  FEF25-75% % PRE PRED % 154 141  DLCO PRE ml/(min*mmHg) 11.28 11.41  DLCO % PRE PRED % 65 67   No PFTs today.  PFTs 12/14/17: FVC 1.58, 57 % predicted. FEV1 1.33, 65% predicted. FEV1/FVC 84.24. DLCO 11.41, 67% predicted. Compared to  06/2015, FVC is down 11.2%. DLCO is essentially stable.   Echocardiogram: 11/21/2015 - EF >55% with normal wall motion. Mildly enlarged RV with normal free wall. Mildly enlarged RA. Normal respiratory collapse of IVC. Peak RV pressure 41 mmHg.   EOTT today: Room air Spo2 96% at rest. HR at start of exercise was 122 / max HR was 150 during exercise = 106% predicted. Lowest SpO2 95% on room air. Walked 258 meters = 846 feet = 81% predicted. Total walk time = 5 minutes. Borg dyspnea scale =  7. Test ended early because of dyspnea, fatigue, and chest tightness.   Labs:  None today  Impression: Patient is a 83 y.o. with PMH significant for rheumatoid arthritis, bronchiectasis (with flare vs. Pneumonia in late 2016), pulmonary fibrosis (not progressed per CT from 2014 to 06/2015), morbid obesity, and deconditioning who comes to pulmonary clinic for follow up of lung disease and dyspnea. She has been maintained on an aggressive bronchiectasis regimen (Acapella, nebulizer, TIW azithromycin ) and she has done well. Unfortunately, she ran out of azithromycin , and in the setting of this, she developed increased cough and sputum and was treated for pneumonia.   Today, she feels like she is recovered from the PNA, but she is not all the way back to her baseline. She has some congestion in her throat and she is concerned about pain in her chest (which is reproducible with palpation over the sternum.  She has limited mobility at home. She uses furniture / counters to get around if she is not using her walker. She does some physical therapy.   Plan - from patient instructions:  I have renewed your azithromycin  - you have a year supply that will be sent to [facility] in [place], [place] [facility]). You should take this every Monday, Wednesday, Friday.   I am also going to write a prescription for a 1 week supply of DAILY azithromycin . So take it daily for 7 days, then switch to every Monday, Wednesday, Friday. The  prescription is written so that this can all be under a single prescription.   Continue to use your Acapella at least 3-4 times per day.   We are going to get another chest x ray today.   Regarding your nebulizer, we will renew albuterol  order from [facility].   Plan to take your nebulizer three times per day (along with Acapella use). Once your congestion is improved, you may back off to twice per day.   Try to use your voltaren  gel on your chest (nearly your sternum where it is sore) to see if this helps with your pain.   Return in about 6 months (around 06/12/2021).     OV 05/23/2021  Subjective:  Patient ID: [name], female , DOB: 03-Sep-1940 , age 60 y.o. , MRN: [id number] , ADDRESS: [address] [place] [address] PCP Viola Greulich, MD Patient Care Team: Viola Greulich, MD as PCP - General (Family Medicine) Avanell Leigh, MD as PCP - Cardiology (Cardiology) Duffy, Jamison Mccreedy, LCSW as Social Worker (Licensed Clinical Social Worker) Kidney, Waylan Haggard, Clydie Darter, MD as Consulting Physician (Rheumatology) [name] Paras, NP (Rheumatology) Odilia Bennett, MD as Referring Physician (Pulmonary Disease)  This Provider for this visit: Treatment Team:  Attending Provider: Maire Scot, MD    05/23/2021 -   Chief Complaint  Patient presents with   Follow-up    Pt states since last OV, she has been in and out of the hospital. States she also ended up with Covid and has had problems with a lot of coughing and phlegm build-up. Pt's O2 sats had been dropping and had to bump O2 up to 3L    Joan F Shipp. -follow-up ILD with bronchiectasis in the setting of rheumatoid arthritis and obesity and physical deconditioning  Problem List - copy and paste from [facility] - DR Craige Dixon B: 1.  Extensive bronchiectasis - cylindrical vs. Traction - has been present and not significantly changed from 2014 to 2017                       -  has history of  hospitalization for what sounds like pneumonia vs. Bronchiectasis flare as far back as 1999; underwent bronchoscopy which made her feel "like a new woman" in 1999 2.  Pulmonary fibrosis - basilar predominant reticular opacities that have not change from 2014 to 2017 and in dec 2020 CT chest 3.  Rheumatoid arthritis: RF 959 ; CCP >300                       - has been treated with methotrexate , biologics, etc in the past - none in the past several years                       - currently on 10 mg prednisone  daily 4.  Morbid obesity with deconditioning - has had several hospitalizations in recent months; very deconditioned at present time  - intentl wt loss +  HPI Joan F Seiler 83 y.o. -returns for follow-up.  I personally not seen her in 2 years almost.  In between she is followed up with nurse practitioner and also [facility].  She is here with her daughter to [place] who I am meeting for the first time.  They tell me that she has had 2 admissions for dehydration in 2022.  After that she went to [place] [facility] where in November 2022 she ended up with COVID and was given antiviral.  They were unhappy with the admission to [facility] rehab.  They do not want to go there again.  Apparently after that she is now needing 3 L of oxygen  while previously she is only needing 2 L of nasal cannula oxygen .  Last CT scan of the chest echocardiogram 2020 including review of the medical records at [facility] Last pulmonary function test with us  was 2015 while at [facility] it was in 2019.  She continues to have functional disability.  She uses a walker to get around and change her clothes.  She continues to be immunosuppressed with prednisone , Plaquenil  and also leflunomide  for rheumatoid arthritis.  She has chronic pain and is on opioids \\CT  Chest data  No results found.   OV 07/12/2021  Subjective:  Patient ID: [name], female , DOB: [birth year] , age 58 y.o. , MRN:  [id number] , ADDRESS: [address] [place] [address] PCP Viola Greulich, MD Patient Care Team: Viola Greulich, MD as PCP - General (Family Medicine) Avanell Leigh, MD as PCP - Cardiology (Cardiology) Duffy, Jamison Mccreedy, LCSW as Social Worker (Licensed Clinical Social Worker) Kidney, Waylan Haggard, Clydie Darter, MD as Consulting Physician (Rheumatology) [name] Paras, NP (Rheumatology) Odilia Bennett, MD as Referring Physician (Pulmonary Disease)  This Provider for this visit: Treatment Team:  Attending Provider: Maire Scot, MD    07/12/2021 -   Chief Complaint  Patient presents with   Follow-up    PFT performed today.   Pt states her breathing is about the same since last visit. States she is no longer coughing up as much mucus compared to last visit.    Problem List - copy and paste from [facility] - DR Craige Dixon B: 1.  Extensive bronchiectasis - cylindrical vs. Traction - has been present and not significantly changed from 2014 to 2017                       - has history of hospitalization for what sounds like pneumonia vs. Bronchiectasis flare as far back  as 1999; underwent bronchoscopy which made her feel "like a new woman" in 1999 2.  Pulmonary fibrosis - basilar predominant reticular opacities that have not change from 2014 to 2017 and in dec 2020 CT chest 3.  Rheumatoid arthritis: RF 959 ; CCP >300                       - has been treated with methotrexate , biologics, etc in the past - none in the past several years                       - currently on 10 mg prednisone  daily 4.  Morbid obesity with deconditioning - has had several hospitalizations in recent months; very deconditioned at present time  - intentl wt loss +  5. COvid - Nov 2022 associated with hospitalizations for dehydration   HPI [name] 83 y.o. -returns for follow-up.  She presents with her daughter Aisha Hove.  She is here to review results.  She does say that after her  COVID in 07 April 2021 her dyspnea is worse and then also oxygen  uses up to 3 L.  Compared to the last visit she is stable but definitely compared to a few years ago she is more short of breath and requiring more oxygen .  She also reports that since November 2022 her right fifth finger has more lateral deviation.  In her fourth finger seems to be in a fixed abnormality.  She did try a short course prednisone  with her primary care physician and it does not help.  She has rheumatology appointment pending with Dr. Arline Laity at [facility] next month.  Her test results show that her echo is essentially normal.  No comment about pulmonary hypertension.  Her pulmonary function test shows FVC declined compared to 2019 at [facility] and also 2015 with us .  This correlates with increased shortness of breath over the last few years increased subjective oxygen  need.  She did have a high-resolution CT scan of the chest that our radiologist feels there is progression and she has UIP pattern with emerging honeycombing.  I personally visualized various CT scans of the chest.  I am not so sure about progression but I did tell her that I would put this up for multidisciplinary case conference.  We discussed care for progressive interstitial lung disease in the setting of rheumatoid arthritis.  Did discuss about registry protocol and I gave her the consent form.  She is interested in this.  Also discussed about nintedanib antifibrotic.  Did discuss about diarrhea as a side effect and GI issues.  She says she is constipated she and her daughter feel with polypharmacy that she should easily be able to handle this 1 other medication.  They are interested in this.  They are willing to wait till multidisciplinary case conference is done.  We also discussed the fact with WHO group 3 pulmonary hypertension inhaled treprostinil as an option.  She is never had a right heart cath.  I have written to Dr. Lauro Portal her  cardiologist to consider right heart catheterization.  We will regroup in a couple of months.    MARCH 2023 ILD condrerence with Dr Gradie Lawless  "Auset Lennyn Gange DOB: [birth year] MRN:  [id number] Female, 83 y.o." Dr. Bertrum Brodie "   RA with ILD . Stable for years but then covid nov 2022 Rx as outpaitent. Since then o2 needs 2 -> 3L /PFT - slightly worse  FVC ? technique issue becase could not do dlco. CT reported as UIP and worse but MR not sure.   Question: is CT really worse? If so, she qualifies for registry study and ofev . "    "HRCT: 06/19/2021 HRCT: 11/24/2018 HRCT: 11/29/2014 HRCT: 07/20/2014 HRCT: 10/05/2013 HRCT: 04/08/2013" No lung biopsy    Concordant Between MDD and official read  On laest scan there is ILD and Tracheobroncholamacia and also diffuse Traction bronchiectasis without ILD. In LL there is bialteral LL HC., subpleural disease UIP that is unusual - suggestive of CTD.     Can approach as ILD     xxxxxxxxxxxxxxxx                   [facility] 08/12/21  HEMODYNAMICS:    1: Right atrial pressure-20/18 2: Right ventricular pressure-37/17, mean 22 3: Pulmonary artery pressure-43/19, mean 30 4: Pulmonary wedge pressure-A-wave 23, V wave 20, mean 22 - does not meet tyvaso criteria 5: Cardiac output-4.97 L/min with an index of 2.43 L/min/m 6: PVR-1.81 -0 does not meet tyvaso cirtiera     IMPRESSION: Ms. Maulden has moderately elevated right atrial pressures and mildly elevated pulmonary artery pressures.  Her PA saturations were 58%.  The sheath was removed and a bandage was placed over the right antecubital puncture site.  The patient left the lab in stable condition.  She will be discharged home later this morning and to follow-up with Dr. Bertrum Brodie and myself.   Lauro Portal. MD, [facility] 08/12/2021 8:06   OV 09/13/2021  Subjective:  Patient ID: [name], female , DOB: Jun 24, 1940 , age 89 y.o. , MRN: [id number] , ADDRESS: [address] [place]  [address] PCP Viola Greulich, MD Patient Care Team: Viola Greulich, MD as PCP - General (Family Medicine) Avanell Leigh, MD as PCP - Cardiology (Cardiology) Duffy, Jamison Mccreedy, LCSW as Social Worker (Licensed Clinical Social Worker) Kidney, Waylan Haggard, Clydie Darter, MD as Consulting Physician (Rheumatology) [name] Paras, NP (Rheumatology) Odilia Bennett, MD as Referring Physician (Pulmonary Disease)  This Provider for this visit: Treatment Team:  Attending Provider: Maire Scot, MD    09/13/2021 -   Chief Complaint  Patient presents with   Follow-up    Pt states she has been doing okay since last visit. States her breathing is about the same.   Problem List - copy and paste from [facility] - DR Craige Dixon B: 1.  Extensive bronchiectasis - cylindrical vs. Traction - has been present and not significantly changed from 2014 to 2017                       - has history of hospitalization for what sounds like pneumonia vs. Bronchiectasis flare as far back as 1999; underwent bronchoscopy which made her feel "like a new woman" in 1999 2.  Pulmonary fibrosis - basilar predominant reticular opacities that have not change from 2014 to 2017 and in dec 2020 CT chest 3.  Rheumatoid arthritis: RF 959 ; CCP >300                       - has been treated with methotrexate , biologics, etc in the past - none in the past several years                       - currently on 10 mg prednisone  daily 4.  Morbid obesity with deconditioning - has had several hospitalizations  in recent months; very deconditioned at present time  - intentl wt loss +  5. COvid - Nov 2022 associated with hospitalizations for dehydratio  HPI [name] 83 y.o. -returns for follow-up.  After the last visit we discussed the multidisciplinary case conference.  Independent radiologist concurred there was UIP and she has had slow progression.  She had right heart catheterization and there is no pulmonary  hypertension to qualify her for inhaled treprostinil based on WHO group 3.  Overall she is better from a RA perspective.  She is doing physical therapy.  No new complaints.  She is still following a strict diet she is lost some more weight.  We discussed nintedanib as an antifibrotic.  She wants to do this.  She has constipation.  Did discuss that sometimes she could have diverticulitis and abdominal . Which is very rare but have seen 1 or 2 patients in the setting of opioids do this especially with nintedanib.  She is accepting of the risk.  Discussed rare possibilities of bleeding and MI.  She is understanding.  She wants to start the medication.    OV 02/04/2022  Subjective:  Patient ID: [name], female , DOB: 01-05-1941 , age 77 y.o. , MRN: [id number] , ADDRESS: [address] [place] [address] PCP Viola Greulich, MD Patient Care Team: Viola Greulich, MD as PCP - General (Family Medicine) Avanell Leigh, MD as PCP - Cardiology (Cardiology) Duffy, Jamison Mccreedy, LCSW as Social Worker (Licensed Clinical Social Worker) [place], [facility] Nicholas Bari, MD as Consulting Physician (Rheumatology) [name] Paras, NP (Rheumatology) Odilia Bennett, MD as Referring Physician (Pulmonary Disease)  This Provider for this visit: Treatment Team:  Attending Provider: Maire Scot, MD    02/04/2022 -   Chief Complaint  Patient presents with   Follow-up    Follow up after PFT. Pt fell on Sunday and she fell a week before that as well.     HPI [name] 83 y.o. -returns for follow-up.  She says that few days ago she had slurred speech noticed by the family members well on the telephone and later when she went to the bathroom she fell patient sustained some injuries.  She went to ER all the imaging work-up as listed below.  Everything was reassuring.  The suspecting a TIA and she has been referred to a neurologist.  Her blood work 02/02/2022  was fairly unremarkable.  She has been on nintedanib since I last saw her she is only in the low-dose 100 mg twice daily because of her polypharmacy and overall poor functional status but even with that she is having significant diarrhea and fatigue.  All her symptom scores are worse.  The son also feels that her overall general wellbeing is decline in the last few weeks.  She definitely attributes the diarrhea to the nintedanib.  She is not sure about the fatigue but she believes it could be from the nintedanib.  There is no GI discomfort other than diarrhea.    CT Chest data - HRT FEb 2023  Narrative & Impression  CLINICAL DATA:  83 year old female with history of chest tightness and shortness of breath. Prior history of COVID infection in December 2022.   EXAM: CT CHEST WITHOUT CONTRAST   TECHNIQUE: Multidetector CT imaging of the chest was performed following the standard protocol without intravenous contrast. High resolution imaging of the lungs, as well as inspiratory and expiratory imaging, was performed.  RADIATION DOSE REDUCTION: This exam was performed according to the departmental dose-optimization program which includes automated exposure control, adjustment of the mA and/or kV according to patient size and/or use of iterative reconstruction technique.   COMPARISON:  Chest CT 01/16/2021.   FINDINGS: Cardiovascular: Heart size is normal. There is no significant pericardial fluid, thickening or pericardial calcification. There is aortic atherosclerosis, as well as atherosclerosis of the great vessels of the mediastinum and the coronary arteries, including calcified atherosclerotic plaque in the left anterior descending coronary artery.   Mediastinum/Nodes: No pathologically enlarged mediastinal or hilar lymph nodes. Esophagus is unremarkable in appearance. No axillary lymphadenopathy.   Lungs/Pleura: High-resolution images demonstrate some patchy areas of  ground-glass attenuation, septal thickening, subpleural reticulation, mild cylindrical traction bronchiectasis, peripheral bronchiolectasis and a few areas of apparent developing honeycombing. These findings have a definitive craniocaudal gradient and appear minimally progressive compared to the recent prior examination. Inspiratory and expiratory imaging demonstrates some mild air trapping indicative of mild small airways disease. No acute consolidative airspace disease. No pleural effusions. No definite suspicious appearing pulmonary nodules or masses are noted.   Upper Abdomen: Unremarkable.   Musculoskeletal: There are no aggressive appearing lytic or blastic lesions noted in the visualized portions of the skeleton.   IMPRESSION: 1. The appearance of the lungs is considered diagnostic of usual interstitial pneumonia (UIP) per current ATS guidelines, demonstrating mild progression compared to prior examinations. 2. Aortic atherosclerosis, in addition to left anterior descending coronary artery disease. Please note that although the presence of coronary artery calcium  documents the presence of coronary artery disease, the severity of this disease and any potential stenosis cannot be assessed on this non-gated CT examination. Assessment for potential risk factor modification, dietary therapy or pharmacologic therapy may be warranted, if clinically indicated.   Aortic Atherosclerosis (ICD10-I70.0).     Electronically Signed   By: Alexandria Angel M.D.   On: 06/21/2021 07:48    OV 04/01/2022  Subjective:  Patient ID: [name], female , DOB: October 21, 1940 , age 74 y.o. , MRN: [id number] , ADDRESS: [address] [place] [address] PCP Glory Larsen, MD Patient Care Team: Glory Larsen, MD as PCP - General (Neurology) Avanell Leigh, MD as PCP - Cardiology (Cardiology) Duffy, Jamison Mccreedy, LCSW as Social Worker (Licensed Clinical Social Worker) [place],  [facility] Nicholas Bari, MD as Consulting Physician (Rheumatology) [name] Paras, NP (Rheumatology) Odilia Bennett, MD as Referring Physician (Pulmonary Disease)  This Provider for this visit: Treatment Team:  Attending Provider: Maire Scot, MD    04/01/2022 -   Chief Complaint  Patient presents with   Follow-up    Pt states she has been doing okay since last visit.     HPI [name] 83 y.o. -after seeing me mid September 2023 we stopped nintedanib because of severe diarrhea.  The diarrhea resolved pretty much within a few days.  After that she was doing okay but was having intermittent nausea.  1 such episode was severe on 03/01/2022 approximately 3 weeks after stopping nintedanib] she ended up in the ER.  Was found to have: Adherent to the bladder and the ovary.  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  This was the sigmoid colon.  She also has intermittent left lower quadrant pain.  She was seen by Mai Schwalbe nurse practitioner 03/11/2022.  A colonoscopy has been scheduled.  03/21/2022 cardiology clearance was obtained.    Otherwise, from a shortness of breath perspective she is stable.  There is no changes here  From a fall perspective she is seen neurology 03/06/2022.  She saw Dr. Narciso Backers.  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  Also multifocal peripheral predominant bilateral chronic microhemorrhage may indicate cerebral amyloid angiopathy.  Mini-Mental scale was 22/30 Aricept  is being started.Today she is here with her daughter Spence Dux.  She was able to understand everything I was saying and giving a good history.  She gave all the details about her neurology visit GI visit all accurately.  Also the ER visit.  CT Chest data  OV 05/27/2022  Subjective:  Patient ID: [name], female , DOB: 10/23/1940 , age 9 y.o. , MRN: [id number] , ADDRESS: [address] [place] [address] PCP Viola Greulich,  MD Patient Care Team: Viola Greulich, MD as PCP - General (Family Medicine) Avanell Leigh, MD as PCP - Cardiology (Cardiology) Duffy, Jamison Mccreedy, LCSW as Social Worker (Licensed Clinical Social Worker) [place], [facility] Nicholas Bari, MD as Consulting Physician (Rheumatology) [name] Paras, NP (Rheumatology) Odilia Bennett, MD as Referring Physician (Pulmonary Disease)  This Provider for this visit: Treatment Team:  Attending Provider: Maire Scot, MD 05/27/2022 -   Chief Complaint  Patient presents with   Follow-up    Some SOB and pain on right breast area.  Not every day.     HPI [name] 83 y.o. -presents for follow-up with her daughter.  Patient is giving most of the history but daughter is corroborating certain key parts.  Since her last visit she visited Dr. Concha Deed freed in the interstitial lung disease clinic at [facility].  Her assessment was to hold off on pirfenidone given patient's current GI issue and very slow progression only.  I concur with this.  I reviewed her note.  That was the main question for this visit.  From a ILD perspective she continues to be stable compared to last visit.  There is no pulmonary function test this visit but symptom wise she is stable.  Her main issues are GI issues.  She has seen Dr. Shela Derby at [facility] surgery in [place] in December 2023.  I reviewed his note as well.  He has recommended very conservative line of management with MiraLAX .  The belief is that opioids with chronic constipation caused the diverticulitis.  We did do some vaccine counseling.  She has had a flu shot but she is yet to have her RSV vaccine and also shingles vaccine.  We discussed about this.  She has had a COVID vaccines      OV 12/22/2022  Subjective:  Patient ID: [name], female , DOB: 07-15-1940 , age 55 y.o. , MRN: [id number] , ADDRESS: [address] [place] [address] PCP  Viola Greulich, MD Patient Care Team: Viola Greulich, MD as PCP - General (Family Medicine) Avanell Leigh, MD as PCP - Cardiology (Cardiology) Duffy, Jamison Mccreedy, LCSW as Social Worker (Licensed Clinical Social Worker) Pa, [facility] Nicholas Bari, MD as Consulting Physician (Rheumatology) [name] Paras, NP (Rheumatology) Odilia Bennett, MD as Referring Physician (Pulmonary Disease) Carnell Christian, RPH (Inactive) (Pharmacist)  This Provider for this visit: Treatment Team:  Attending Provider: Maire Scot, MD    12/22/2022 -   Chief Complaint  Patient presents with   Follow-up    F/up on PFT     HPI [name] 82 y.o. -presents for follow-up.  She tells me that her shortness  of breath is stable.  Infective pulmonary function test shows recent stability.  There are no issues here.  Her antifibrotic's are on hold after nintedanib related GI issues and subsequent ongoing diverticular issues.  Most recently last week she was admitted for lower GI bleed.  Eliquis  was held but she was told to restart.  She has not restarted this because of fear of recurrent bleeding.  She is also having chronic back pain and she wants to have spinal injections.  She wants to know if it is okay for her to hold her Eliquis .  She has had PE 1 year ago but remotely she did have some DVT as well.  At this point in time Eliquis  on hold no active bleeding.  We did discuss the pros and cons of holding Eliquis .  Did indicate for the moment that we can hold.  This will allow her to get some control with her bleeding and help her anemia resolve and also get some spinal injections.  We can check D-dimer [she specifically asked for her D-dimer to be checked] I suspect it will still be elevated because of recent hospitalization and then recheck again in a few months.  If it is still elevated we can consider low-dose Eliquis .  She is agreeable with this plan.     OV  06/02/2023  Subjective:  Patient ID: [name], female , DOB: [birth year] , age 72 y.o. , MRN: [id number] , ADDRESS: [address] [place] [address] PCP Viola Greulich, MD Patient Care Team: Viola Greulich, MD as PCP - General (Family Medicine) Avanell Leigh, MD as PCP - Cardiology (Cardiology) Duffy, Jamison Mccreedy, LCSW as Social Worker (Licensed Clinical Social Worker) Pa, [facility] Nicholas Bari, MD as Consulting Physician (Rheumatology) [name] Paras, NP (Rheumatology) Odilia Bennett, MD as Referring Physician (Pulmonary Disease) Carnell Christian, [facility] (Pharmacist)  This Provider for this visit: Treatment Team:  Attending Provider: Maire Scot, MD   06/02/2023 -   Chief Complaint  Patient presents with   Follow-up    Doing better but still feeling fatigue, breathing is same. Pt also states she feels discomfort in her chest and congestion. Using nebs and inhalers      HPI [name] 82 y.o. -presents for follow-up.  Presents with her son.  He is an independent historian.  She tells me that overall she is stable but the chronic cough is slightly worse.  She is not on antifibrotic she is not on Eliquis .  She is not having any bleeding issues but she states she still has stomach issues.  She is asking for GI referral.  Review of the external medical records indicated that she is a Adult nurse GI patient last signed off by [facility] in the hospital July 2024.  She says she did not follow-up with them because the waiting time was 4 months.  I did indicate to her that all gastroenterology offices have a long waiting time.  Therefore she is okay seeing the established GI provider [facility].  I made a referral.  Regarding her cough she is open to having allergy panel testing done.  It has now been 2 years since her last CT scan of the chest and therefore she is willing to have repeat CT scan chest done.  For now the ILD appears to be  stable without any antifibrotic's.  She continues on immunosuppression for her rheumatoid arthritis with Humira, prednisone  and Plaquenil  through [facility].  She  is on 3 times a week azithromycin  originally given by [facility] for her chronic cough and mucus production.  I have refilled this today.   Of note despite being on Ozempic  she has gained 20 pounds of weight.  OV 09/07/2023  Subjective:  Patient ID: [name], female , DOB: 1941/01/31 , age 2 y.o. , MRN: [id number] , ADDRESS: [address] [place] [address] PCP Viola Greulich, MD Patient Care Team: Viola Greulich, MD as PCP - General (Family Medicine) Avanell Leigh, MD as PCP - Cardiology (Cardiology) Duffy, Jamison Mccreedy, LCSW as Social Worker (Licensed Clinical Social Worker) Pa, [facility] Nicholas Bari, MD as Consulting Physician (Rheumatology) [name] Paras, NP (Rheumatology) Odilia Bennett, MD as Referring Physician (Pulmonary Disease) Carnell Christian, [facility] (Pharmacist)  This Provider for this visit: Treatment Team:  Attending Provider: Maire Scot, MD     Problem List - copy and paste from [facility] - DR Craige Dixon B: 1.  Extensive bronchiectasis - cylindrical vs. Traction - has been present and not significantly changed from 2014 to 2017                       - has history of hospitalization for what sounds like pneumonia vs. Bronchiectasis flare as far back as 1999; underwent bronchoscopy which made her feel "like a new woman" in 1999 2.  Pulmonary fibrosis - basilar predominant reticular opacities that have not change from 2014 to 2017 and in dec 2020 CT chest 3.  Rheumatoid arthritis: RF 959 ; CCP >300                       - has been treated with methotrexate , biologics, etc in the past - none in the past several years                       - currently on 10 mg prednisone  daily    -Started nintedanib?  Approximately May 2023 but stopped  September 2023 because of significant diarrhea   -Joint ILD-Pro registry 04/01/2022 3b.  Normal right heart catheterization March 2023 4.  Morbid obesity with deconditioning - has had several hospitalizations in recent months; very deconditioned at present time  - intentl wt loss +  5. COvid - Nov 2022 associated with hospitalizations for dehydratin  6. 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 Augu 2024/July 2024: Admission for lower GI bleed rectal bleed nadir hemoglobin 9 g% December 16, 2022    9.9 in aug 2024  11.4 at [facility] Dec 2024  09/07/2023 -  No chief complaint on file.    HPI Kayleann F Mcdaniel 83 y.o. -    SYMPTOM SCALE - ILD 06/21/2019 Birth weight not on file Last Weight  Most recent update: 06/21/2019 11:53 AM    Weight  113.3 kg (249 lb 12.8 oz)             05/23/2021 224# (used to 300# some yearsaago) 07/12/2021 224# 02/04/2022 Ofev   04/01/2022 Off ofev  06/02/2023 Off ofeve  O2 use 2L nithg 3L 3L  Ra rest.  Nighttime oxygen  and daytime as needed Ra rest .night o2  Shortness of Breath 0 -> 5  scale with 5 being worst (score 6 If unable to do)       At rest 2 2 3 2  0 3  Simple tasks - showers, clothes change, eating, shaving 5 4 4 3 2 2   Household (dishes, doing bed, laundry) 4  5 4 2  0  Shopping 6  5 4 5  0  Walking level at own pace 3 4 5 5 4 3   Walking up Stairs 6  x 0 5 0  Total (30-36) Dyspnea Score 26 3 22 19 18 8   How bad is your cough? 3 3 2 5 1 2   How bad is your fatigue 0 0 4 5 4 3   How bad is nausea 0 0 0 0 0 to 4 4  How bad is vomiting?  0 0 0 0 0 2  How bad is diarrhea? 0 0 0 5 0 0  How bad is anxiety? 2 4 3 5 4  0  How bad is depression 2 3 3 5 4  0    CT Chest data from date: ****  - personally visualized and independently interpreted : *** - my  findings are: ***   PFT     Latest Ref Rng & Units 05/01/2023   12:39 PM 12/22/2022   10:33 AM 02/04/2022    3:06 PM 07/12/2021    2:29 PM 01/12/2014   12:59 PM 09/12/2013   10:02 AM 04/28/2013   10:07 AM  PFT Results  FVC-Pre L 1.70  1.49  1.47  1.29  1.59  1.78  1.73   FVC-Predicted Pre % 66  58  62  70  76  77  74   FVC-Post L     1.46  1.54  1.19   FVC-Predicted Post %     70  66  51   Pre FEV1/FVC % % 85  86  91  92  91  91  84   Post FEV1/FCV % %     93  89  97   FEV1-Pre L 1.44  1.29  1.34  1.18  1.45  1.63  1.46   FEV1-Predicted Pre % 75  67  76  83  90  90  81   FEV1-Post L     1.36  1.36  1.15   DLCO uncorrected ml/min/mmHg 13.36   11.92  -0.03  16.69  12.50    DLCO UNC% % 71   67  0  77  51    DLCO corrected ml/min/mmHg 13.36   11.89  -0.03      DLCO COR %Predicted % 71   67  0      DLVA Predicted % 93   95  29  95  91    TLC L     3.55  3.57  2.94   TLC % Predicted %     74  70  58   RV % Predicted %     62  66  62        LAB RESULTS last 96 hours No results found.       has a past medical history of Acute encephalopathy (03/30/2021), Acute kidney injury superimposed on chronic kidney disease (HCC) (04/17/2015), Acute on chronic diastolic (congestive) heart failure (HCC), Anemia, Anxiety, Arthritis, Asthma, Carotid artery occlusion, Chest pain of uncertain etiology (01/28/2012), Chronic fatigue, Chronic kidney disease, Closed intertrochanteric fracture of hip, left, initial encounter (HCC) (11/29/2016), Closed left hip fracture (HCC), Closed nondisplaced intertrochanteric fracture of  left femur with delayed healing (01/14/2017), Clotting disorder (HCC), Contusion of left knee, COPD (chronic obstructive pulmonary disease) (HCC), Coronary artery disease, Depression, reactive, Diabetes mellitus, Diastolic dysfunction, Difficulty in walking, Family history of heart disease, Flu (06/26/2023), Generalized muscle weakness, Gout, High cholesterol, History of falling,  Hypertension, Hypothyroidism, Interstitial lung disease (HCC), 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   reports that she has never smoked. She has never used smokeless tobacco.  Past Surgical History:  Procedure Laterality Date   BRAIN SURGERY     Gamma knife 10/13. Needs repeat spring  '14   CARDIAC CATHETERIZATION N/A 10/31/2014   Procedure: Right/Left Heart Cath and Coronary Angiography;  Surgeon: Lucendia Rusk, MD;  [facility]: [facility] INVASIVE CV LAB;  Service: Cardiovascular;  Laterality: N/A;   COLONOSCOPY WITH PROPOFOL  N/A 04/14/2022   Procedure: COLONOSCOPY WITH PROPOFOL ;  Surgeon: Nannette Babe, MD;  Location: [facility] ENDOSCOPY;  Service: Gastroenterology;  Laterality: N/A;   CYST EXCISION     2 on face   CYST REMOVAL NECK     ESOPHAGOGASTRODUODENOSCOPY (EGD) WITH PROPOFOL  N/A 09/14/2014   Procedure: ESOPHAGOGASTRODUODENOSCOPY (EGD) WITH PROPOFOL ;  Surgeon: Claudette Cue, MD;  Location: [facility] ENDOSCOPY;  Service: Endoscopy;  Laterality: N/A;   INCISION AND DRAINAGE HIP Left 01/16/2017   Procedure: IRRIGATION AND DEBRIDEMENT LEFT HIP;  Surgeon: Arnie Lao, MD;  Location: [facility] ORS;  Service: Orthopedics;  Laterality: Left;   INTRAMEDULLARY (IM) NAIL INTERTROCHANTERIC Left 11/29/2016   Procedure: INTRAMEDULLARY (IM) NAIL INTERTROCHANTRIC;  Surgeon: Arnie Lao, MD;  Location: [facility] OR;  Service: Orthopedics;  Laterality: Left;   LAPAROSCOPIC LOOP COLOSTOMY N/A 07/17/2023   Procedure: LAPAROSCOPIC DIVERTING LOOP COLOSTOMY;  Surgeon: Dareen Ebbing, MD;  Location: [facility] OR;  Service: General;  Laterality: N/A;   OVARY  SURGERY     RIGHT HEART CATH N/A 08/12/2021   Procedure: RIGHT HEART CATH;  Surgeon: Avanell Leigh, MD;  Location: [facility] INVASIVE CV LAB;  Service: Cardiovascular;  Laterality: N/A;   SHOULDER SURGERY Left    TONSILLECTOMY  age 61   VAGINAL HYSTERECTOMY     VIDEO BRONCHOSCOPY Bilateral 05/31/2013   Procedure: VIDEO BRONCHOSCOPY WITHOUT FLUORO;  Surgeon: Maire Scot, MD;  Location: [facility] ENDOSCOPY;  Service: Cardiopulmonary;  Laterality: Bilateral;   video bronscoscopy  05/19/1998   lung    Allergies  Allergen Reactions   Codeine Swelling and Other (See Comments)    Facial and leg swelling Chest pain    Remicade [Infliximab] Anaphylaxis    "sent me into shock"    Zestril [Lisinopril] Swelling and Rash    Face and neck swelling    Ofev  [Nintedanib] Diarrhea    Immunization History  Administered Date(s) Administered   Fluad Quad(high Dose 65+) 02/02/2019, 03/11/2021, 04/01/2022   Fluad Trivalent(High Dose 65+) 03/02/2023   Influenza Split 01/29/2012, 03/30/2013, 02/10/2014   Influenza, High Dose Seasonal PF 02/16/2017, 02/11/2018, 01/31/2019, 02/27/2020   Influenza,inj,Quad PF,6+ Mos 02/09/2015, 12/18/2015   Influenza-Unspecified 02/09/2015, 12/18/2015   PFIZER Comirnaty(Gray Top)Covid-19 Tri-Sucrose Vaccine 07/24/2019, 08/14/2019, 02/27/2020   PFIZER(Purple Top)SARS-COV-2 Vaccination 07/17/2019, 08/14/2019   PPD Test 04/23/2015   Pneumococcal Conjugate-13 08/21/2014   Pneumococcal Polysaccharide-23 02/17/2012   Tdap 12/27/2015    Family History  Problem Relation Age of Onset   Diabetes  Mother    Heart attack Mother    Hypertension Father    Lung cancer Father    Diabetes Sister    Breast cancer Sister    Breast cancer Sister    Diabetes Brother    Hypertension Brother    Heart disease Brother    Heart attack Brother    Kidney cancer Brother    Gout Brother    Kidney failure Brother        x 5   Esophageal cancer Brother    Stomach cancer Brother     Prostate cancer Brother    Uterine cancer Daughter        with mets   Fibroids Daughter    Hypertension Daughter    Hypertension Daughter    Hypertension Son    Heart murmur Son    Rheum arthritis Maternal Uncle    Alzheimer's disease Neg Hx    Dementia Neg Hx      Current Outpatient Medications:    acetaminophen  (TYLENOL ) 500 MG tablet, Take 1,000 mg by mouth every 8 (eight) hours as needed (pain)., Disp: , Rfl:    albuterol  (VENTOLIN  HFA) 108 (90 Base) MCG/ACT inhaler, Inhale 2 puffs into the lungs in the morning and at bedtime., Disp: , Rfl:    atorvastatin  (LIPITOR) 10 MG tablet, Take 1 tablet (10 mg total) by mouth daily. (Patient taking differently: Take 10 mg by mouth at bedtime.), Disp: 90 tablet, Rfl: 3   azithromycin  (ZITHROMAX ) 250 MG tablet, One tab m-wed -friday, Disp: , Rfl:    bisacodyl  (DULCOLAX) 10 MG suppository, Place 10 mg rectally daily as needed (bowel management)., Disp: , Rfl:    Cholecalciferol  (VITAMIN D3) 50 MCG (2000 UT) TABS, Take 2,000 Units by mouth daily., Disp: , Rfl:    diclofenac  Sodium (VOLTAREN ) 1 % GEL, Apply 2 g topically 3 (three) times daily., Disp: , Rfl:    DULoxetine  (CYMBALTA ) 60 MG capsule, Take 1 capsule (60 mg total) by mouth daily., Disp: 3 capsule, Rfl: 0   furosemide  (LASIX ) 40 MG tablet, Take 1 tablet (40 mg total) by mouth daily., Disp: , Rfl:    gabapentin  (NEURONTIN ) 300 MG capsule, Take 1 capsule (300 mg total) by mouth 2 (two) times daily., Disp: , Rfl:    guaiFENesin  (MUCINEX ) 600 MG 12 hr tablet, Take 2 tablets (1,200 mg total) by mouth 2 (two) times daily., Disp: , Rfl:    hydroxychloroquine  (PLAQUENIL ) 200 MG tablet, Take 1 tablet (200 mg total) by mouth daily., Disp: , Rfl:    leflunomide  (ARAVA ) 20 MG tablet, Take 1 tablet (20 mg total) by mouth daily., Disp: , Rfl:    leptospermum manuka honey (MEDIHONEY) PSTE paste, Apply 1 Application topically daily. Interchangeable with TheraHoney Apply thin layer (3 mm) to wound., Disp:  , Rfl:    levothyroxine  (SYNTHROID ) 50 MCG tablet, Take 1 tablet (50 mcg total) by mouth daily. (Patient taking differently: Take 50 mcg by mouth daily before breakfast.), Disp: 90 tablet, Rfl: 3   lidocaine  (LIDODERM ) 5 %, Place 1 patch onto the skin daily. Remove & Discard patch within 12 hours or as directed by MD, Disp: 5 patch, Rfl: 0   magnesium  hydroxide (MILK OF MAGNESIA) 400 MG/5ML suspension, Take 30 mLs by mouth daily as needed (no BM in 3 days)., Disp: , Rfl:    Magnesium  Oxide 250 MG TABS, Take 1 tablet (250 mg total) by mouth daily., Disp: , Rfl:    methocarbamol  (ROBAXIN ) 500 MG tablet, Take 1  tablet (500 mg total) by mouth every 6 (six) hours as needed for muscle spasms (pain)., Disp: , Rfl:    METOPROLOL  TARTRATE PO, Take 62.5 mg by mouth every 12 (twelve) hours., Disp: , Rfl:    morphine  (MS CONTIN ) 15 MG 12 hr tablet, Take 1 tablet (15 mg total) by mouth every 12 (twelve) hours., Disp: 15 tablet, Rfl: 0   Multiple Vitamin (MULTIVITAMIN) tablet, Take 1 tablet by mouth daily., Disp: , Rfl:    nitroGLYCERIN  (NITROSTAT ) 0.4 MG SL tablet, Place 1 tablet (0.4 mg total) under the tongue every 5 (five) minutes as needed for chest pain., Disp: , Rfl:    OXYGEN , Inhale 3 L/min into the lungs at bedtime., Disp: , Rfl:    pantoprazole  (PROTONIX ) 40 MG tablet, Take 1 tablet (40 mg total) by mouth 2 (two) times daily. (Patient taking differently: Take 40 mg by mouth daily before breakfast.), Disp: 120 tablet, Rfl: 4   polyethylene glycol powder (GLYCOLAX /MIRALAX ) 17 GM/SCOOP powder, Take 17 g by mouth daily as needed (bowel management)., Disp: , Rfl:    potassium chloride  SA (KLOR-CON  M) 20 MEQ tablet, Take 2 tablets (40 mEq total) by mouth 2 (two) times daily., Disp: , Rfl:    predniSONE  (DELTASONE ) 10 MG tablet, Take 1 tablet (10 mg total) by mouth daily with breakfast. (Patient taking differently: Take 10 mg by mouth at bedtime.), Disp: 90 tablet, Rfl: 1   senna (SENOKOT) 8.6 MG TABS tablet,  Take 17.2 mg by mouth daily as needed (bowel management)., Disp: , Rfl:    Sodium Phosphates  (ENEMA) ENEM, Place 1 enema rectally daily as needed (constipation not relieved by bisacodyl  suppository)., Disp: , Rfl:    ZINC  OXIDE EX, Apply 1 application  topically See admin instructions. Apply topically to sacral area twice daily with morning/night shift and as needed with each incontinent episode., Disp: , Rfl:       Objective:   There were no vitals filed for this visit.  Estimated body mass index is 33.9 kg/m as calculated from the following:   Height as of 08/12/23: 5\' 3"  (1.6 m).   Weight as of 08/12/23: 191 lb 5.8 oz (86.8 kg).  @WEIGHTCHANGE @  There were no vitals filed for this visit.   Physical Exam   General: No distress. *** O2 at rest: *** Cane present: *** Sitting in wheel chair: *** Frail: *** Obese: *** Neuro: Alert and Oriented x 3. GCS 15. Speech normal Psych: Pleasant Resp:  Barrel Chest - ***.  Wheeze - ***, Crackles - ***, No overt respiratory distress CVS: Normal heart sounds. Murmurs - *** Ext: Stigmata of Connective Tissue Disease - *** HEENT: Normal upper airway. PEERL +. No post nasal drip        Assessment:     No diagnosis found.     Plan:     Patient Instructions  ILD (interstitial lung disease) (HCC)  - clinically stable but cough worse  -worsening couhg - need to rule out allergies  Plan - check cbc with diff and RAST allergy panel 06/02/2023  = monitor purlse ox; if dropping go to ER - monitor off ofev  (due to prior hx with diarrhea) - HRCT supine n 3 months  - spiro and dlco in 3 months - RA medicines Humira, plaquenil  and prednisone  via [facility] - refill Azithromycin  3x/wekk  History of pulmonary embolism June 2023 History of Gl bleed summer 2024 with history of diverticulosos  Recent July 2024 Lower GI Bleed and resultant anemia ->  anemia has improved dec 2024 Compleed 13 months of eliquids for PE treatment ending August 2024  and stiopoed due to GI bleed  Plan  --No antifibrotic till  GI issues sorted out ad disease progresses further; (Ofev  to be listed as allergy) =check cbc and d-dimer  06/02/2023 - refer [facility] (last seen in hospital July 2024) - hold off eliquis  for now   Followup  - 3 mnths with Dr. Bertrum Brodie but after spiro/dlco  = 30 min visit; symptoms score at followup; can look at reepeat d-dime and decide on atleast low dose eliqusi at followup   FOLLOWUP No follow-ups on file.    SIGNATURE    Dr. Maire Scot, M.D., F.C.C.P,  Pulmonary and Critical Care Medicine Staff Physician, [facility] Director - Interstitial Lung Disease  Program  Pulmonary Fibrosis [facility] at [facility] [place], [place], [address]  Pager: [phone], If no answer or between  15:00h - 7:00h: call [phone] Telephone: [phone]  6:31 AM 09/07/2023   Moderate Complexity MDM OFFICE  2021 E/M guidelines, first released in 2021, with minor revisions added in 2023 and 2024 Must meet the requirements for 2 out of 3 dimensions to qualify.    Number and complexity of problems addressed Amount and/or complexity of data reviewed Risk of complications and/or morbidity  One or more chronic illness with mild exacerbation, OR progression, OR  side effects of treatment  Two or more stable chronic illnesses  One undiagnosed new problem with uncertain prognosis  One acute illness with systemic symptoms   One Acute complicated injury Must meet the requirements for 1 of 3 of the categories)  Category 1: Tests and documents, historian  Any combination of 3 of the following:  Assessment requiring an independent historian  Review of prior external note(s) from each unique source  Review of results of each unique test  Ordering of each unique test    Category 2: Interpretation of tests   Independent interpretation of a test performed by  another physician/other qualified health care professional (not separately reported)  Category 3: Discuss management/tests  Discussion of management or test interpretation with external physician/other qualified health care professional/appropriate source (not separately reported) Moderate risk of morbidity from additional diagnostic testing or treatment Examples only:  Prescription drug management  Decision regarding minor surgery with identfied patient or procedure risk factors  Decision regarding elective major surgery without identified patient or procedure risk factors  Diagnosis or treatment significantly limited by social determinants of health             HIGh Complexity  OFFICE   2021 E/M guidelines, first released in 2021, with minor revisions added in 2023. Must meet the requirements for 2 out of 3 dimensions to qualify.    Number and complexity of problems addressed Amount and/or complexity of data reviewed Risk of complications and/or morbidity  Severe exacerbation of chronic illness  Acute or chronic illnesses that may pose a threat to life or bodily function, e.g., multiple trauma, acute MI, pulmonary embolus, severe respiratory distress, progressive rheumatoid arthritis, psychiatric illness with potential threat to self or others, peritonitis, acute renal failure, abrupt change in neurological status Must meet the requirements for 2 of 3 of the categories)  Category 1: Tests and documents, historian  Any combination of 3 of the following:  Assessment requiring an independent historian  Review of prior external note(s) from each unique source  Review of results  of each unique test  Ordering of each unique test    Category 2: Interpretation of tests    Independent interpretation of a test performed by another physician/other qualified health care professional (not separately reported)  Category 3: Discuss management/tests  Discussion of management  or test interpretation with external physician/other qualified health care professional/appropriate source (not separately reported)  HIGH risk of morbidity from additional diagnostic testing or treatment Examples only:  Drug therapy requiring intensive monitoring for toxicity  Decision for elective major surgery with identified pateint or procedure risk factors  Decision regarding hospitalization or escalation of level of care  Decision for DNR or to de-escalate care   Parenteral controlled  substances            LEGEND - Independent interpretation involves the interpretation of a test for which there is a CPT code, and an interpretation or report is customary. When a review and interpretation of a test is performed and documented by the provider, but not separately reported (billed), then this would represent an independent interpretation. This report does not need to conform to the usual standards of a complete report of the test. This does not include interpretation of tests that do not have formal reports such as a complete blood count with differential and blood cultures. Examples would include reviewing a chest radiograph and documenting in the medical record an interpretation, but not separately reporting (billing) the interpretation of the chest radiograph.   An appropriate source includes professionals who are not health care professionals but may be involved in the management of the patient, such as a Clinical research associate, upper officer, case manager or teacher, and does not include discussion with family or informal caregivers.    - SDOH: SDOH are the conditions in the environments where people are born, live, learn, work, play, worship, and age that affect a wide range of health, functioning, and quality-of-life outcomes and risks. (e.g., housing, food insecurity, transportation, etc.). SDOH-related Z codes ranging from Z55-Z65 are the ICD-10-CM diagnosis codes used to document SDOH  data Z55 - Problems related to education and literacy Z56 - Problems related to employment and unemployment Z57 - Occupational exposure to risk factors Z58 - Problems related to physical environment Z59 - Problems related to housing and economic circumstances [phone] - Problems related to social environment [phone] - Problems related to upbringing [phone] - Other problems related to primary support group, including family circumstances Z56 - Problems related to certain psychosocial circumstances Z65 - Problems related to other psychosocial circumstances

## 2023-09-08 DIAGNOSIS — R296 Repeated falls: Secondary | ICD-10-CM | POA: Diagnosis not present

## 2023-09-08 DIAGNOSIS — L89153 Pressure ulcer of sacral region, stage 3: Secondary | ICD-10-CM | POA: Diagnosis not present

## 2023-09-09 DIAGNOSIS — M6281 Muscle weakness (generalized): Secondary | ICD-10-CM | POA: Diagnosis not present

## 2023-09-09 DIAGNOSIS — R296 Repeated falls: Secondary | ICD-10-CM | POA: Diagnosis not present

## 2023-09-09 DIAGNOSIS — D849 Immunodeficiency, unspecified: Secondary | ICD-10-CM | POA: Diagnosis not present

## 2023-09-09 DIAGNOSIS — I5032 Chronic diastolic (congestive) heart failure: Secondary | ICD-10-CM | POA: Diagnosis not present

## 2023-09-09 DIAGNOSIS — M069 Rheumatoid arthritis, unspecified: Secondary | ICD-10-CM | POA: Diagnosis not present

## 2023-09-09 DIAGNOSIS — J449 Chronic obstructive pulmonary disease, unspecified: Secondary | ICD-10-CM | POA: Diagnosis not present

## 2023-09-09 DIAGNOSIS — J9611 Chronic respiratory failure with hypoxia: Secondary | ICD-10-CM | POA: Diagnosis not present

## 2023-09-09 DIAGNOSIS — M545 Low back pain, unspecified: Secondary | ICD-10-CM | POA: Diagnosis not present

## 2023-09-09 DIAGNOSIS — F32A Depression, unspecified: Secondary | ICD-10-CM | POA: Diagnosis not present

## 2023-09-09 DIAGNOSIS — Z741 Need for assistance with personal care: Secondary | ICD-10-CM | POA: Diagnosis not present

## 2023-09-09 DIAGNOSIS — R2689 Other abnormalities of gait and mobility: Secondary | ICD-10-CM | POA: Diagnosis not present

## 2023-09-10 ENCOUNTER — Ambulatory Visit: Attending: Cardiology | Admitting: Cardiology

## 2023-09-10 ENCOUNTER — Encounter: Payer: Self-pay | Admitting: Cardiology

## 2023-09-10 VITALS — BP 130/74 | HR 79 | Ht 64.0 in | Wt 188.0 lb

## 2023-09-10 DIAGNOSIS — I10 Essential (primary) hypertension: Secondary | ICD-10-CM | POA: Diagnosis not present

## 2023-09-10 DIAGNOSIS — I2510 Atherosclerotic heart disease of native coronary artery without angina pectoris: Secondary | ICD-10-CM | POA: Diagnosis not present

## 2023-09-10 DIAGNOSIS — R000 Tachycardia, unspecified: Secondary | ICD-10-CM | POA: Diagnosis not present

## 2023-09-10 DIAGNOSIS — E1140 Type 2 diabetes mellitus with diabetic neuropathy, unspecified: Secondary | ICD-10-CM | POA: Diagnosis not present

## 2023-09-10 DIAGNOSIS — I5032 Chronic diastolic (congestive) heart failure: Secondary | ICD-10-CM | POA: Diagnosis not present

## 2023-09-10 DIAGNOSIS — R0609 Other forms of dyspnea: Secondary | ICD-10-CM | POA: Diagnosis not present

## 2023-09-10 DIAGNOSIS — N182 Chronic kidney disease, stage 2 (mild): Secondary | ICD-10-CM | POA: Insufficient documentation

## 2023-09-10 DIAGNOSIS — Z86711 Personal history of pulmonary embolism: Secondary | ICD-10-CM | POA: Insufficient documentation

## 2023-09-10 NOTE — Patient Instructions (Signed)
 Medication Instructions:  No changes *If you need a refill on your cardiac medications before your next appointment, please call your pharmacy*  Lab Work: No labs If you have labs (blood work) drawn today and your tests are completely normal, you will receive your results only by: MyChart Message (if you have MyChart) OR A paper copy in the mail If you have any lab test that is abnormal or we need to change your treatment, we will call you to review the results.  Testing/Procedures: No testing  Follow-Up: At [facility], you and your health needs are our priority.  As part of our continuing mission to provide you with exceptional heart care, our providers are all part of one team.  This team includes your primary Cardiologist (physician) and Advanced Practice Providers or APPs (Physician Assistants and Nurse Practitioners) who all work together to provide you with the care you need, when you need it.  Your next appointment:   2-3 month(s)  Provider:   Lauro Portal, MD    We recommend signing up for the patient portal called "MyChart".  Sign up information is provided on this After Visit Summary.  MyChart is used to connect with patients for Virtual Visits (Telemedicine).  Patients are able to view lab/test results, encounter notes, upcoming appointments, etc.  Non-urgent messages can be sent to your provider as well.   To learn more about what you can do with MyChart, go to [email or web address].   Other Instructions:   1st Floor: - Lobby - Registration  - Pharmacy  - Lab - Cafe  2nd Floor: - PV Lab - Diagnostic Testing (echo, CT, nuclear med)  3rd Floor: - Vacant  4th Floor: - TCTS (cardiothoracic surgery) - AFib Clinic - Structural Heart Clinic - Vascular Surgery  - Vascular Ultrasound  5th Floor: - [facility] (general and EP) - Clinical Pharmacy for coumadin, hypertension, lipid, weight-loss medications, and med management  appointments    Valet parking services will be available as well.

## 2023-09-11 DIAGNOSIS — I10 Essential (primary) hypertension: Secondary | ICD-10-CM | POA: Diagnosis not present

## 2023-09-11 DIAGNOSIS — I5032 Chronic diastolic (congestive) heart failure: Secondary | ICD-10-CM | POA: Diagnosis not present

## 2023-09-11 DIAGNOSIS — F419 Anxiety disorder, unspecified: Secondary | ICD-10-CM | POA: Diagnosis not present

## 2023-09-11 DIAGNOSIS — F4323 Adjustment disorder with mixed anxiety and depressed mood: Secondary | ICD-10-CM | POA: Diagnosis not present

## 2023-09-11 DIAGNOSIS — I25119 Atherosclerotic heart disease of native coronary artery with unspecified angina pectoris: Secondary | ICD-10-CM | POA: Diagnosis not present

## 2023-09-15 DIAGNOSIS — L89153 Pressure ulcer of sacral region, stage 3: Secondary | ICD-10-CM | POA: Diagnosis not present

## 2023-09-16 DIAGNOSIS — Z741 Need for assistance with personal care: Secondary | ICD-10-CM | POA: Diagnosis not present

## 2023-09-16 DIAGNOSIS — R2689 Other abnormalities of gait and mobility: Secondary | ICD-10-CM | POA: Diagnosis not present

## 2023-09-16 DIAGNOSIS — M069 Rheumatoid arthritis, unspecified: Secondary | ICD-10-CM | POA: Diagnosis not present

## 2023-09-16 DIAGNOSIS — D849 Immunodeficiency, unspecified: Secondary | ICD-10-CM | POA: Diagnosis not present

## 2023-09-16 DIAGNOSIS — F32A Depression, unspecified: Secondary | ICD-10-CM | POA: Diagnosis not present

## 2023-09-16 DIAGNOSIS — J449 Chronic obstructive pulmonary disease, unspecified: Secondary | ICD-10-CM | POA: Diagnosis not present

## 2023-09-16 DIAGNOSIS — J9611 Chronic respiratory failure with hypoxia: Secondary | ICD-10-CM | POA: Diagnosis not present

## 2023-09-16 DIAGNOSIS — M6281 Muscle weakness (generalized): Secondary | ICD-10-CM | POA: Diagnosis not present

## 2023-09-16 DIAGNOSIS — I5032 Chronic diastolic (congestive) heart failure: Secondary | ICD-10-CM | POA: Diagnosis not present

## 2023-09-17 DIAGNOSIS — Z433 Encounter for attention to colostomy: Secondary | ICD-10-CM | POA: Diagnosis not present

## 2023-09-21 ENCOUNTER — Encounter: Payer: Self-pay | Admitting: Cardiovascular Disease

## 2023-09-21 ENCOUNTER — Telehealth: Payer: Self-pay | Admitting: Cardiovascular Disease

## 2023-09-21 DIAGNOSIS — F320 Major depressive disorder, single episode, mild: Secondary | ICD-10-CM | POA: Diagnosis not present

## 2023-09-21 DIAGNOSIS — I5032 Chronic diastolic (congestive) heart failure: Secondary | ICD-10-CM | POA: Diagnosis not present

## 2023-09-21 DIAGNOSIS — M069 Rheumatoid arthritis, unspecified: Secondary | ICD-10-CM | POA: Diagnosis not present

## 2023-09-21 DIAGNOSIS — I25119 Atherosclerotic heart disease of native coronary artery with unspecified angina pectoris: Secondary | ICD-10-CM | POA: Diagnosis not present

## 2023-09-21 DIAGNOSIS — I10 Essential (primary) hypertension: Secondary | ICD-10-CM | POA: Diagnosis not present

## 2023-09-21 NOTE — Telephone Encounter (Signed)
-----   Message from [place] W sent at 09/10/2023 11:07 AM EDT ----- Regarding: 2-3 Mth F/U [name] Patiient needs 2-3 mth f/u with [name] At time of check out it wasn't allowing me to schedule

## 2023-09-21 NOTE — Telephone Encounter (Signed)
 LVM 3x to schedule, will send reminder letter

## 2023-09-22 ENCOUNTER — Encounter: Payer: Self-pay | Admitting: Podiatry

## 2023-09-22 ENCOUNTER — Ambulatory Visit (INDEPENDENT_AMBULATORY_CARE_PROVIDER_SITE_OTHER): Admitting: Podiatry

## 2023-09-22 DIAGNOSIS — B351 Tinea unguium: Secondary | ICD-10-CM | POA: Insufficient documentation

## 2023-09-22 DIAGNOSIS — M79674 Pain in right toe(s): Secondary | ICD-10-CM

## 2023-09-22 DIAGNOSIS — M79675 Pain in left toe(s): Secondary | ICD-10-CM

## 2023-09-22 DIAGNOSIS — E1140 Type 2 diabetes mellitus with diabetic neuropathy, unspecified: Secondary | ICD-10-CM

## 2023-09-22 DIAGNOSIS — L89153 Pressure ulcer of sacral region, stage 3: Secondary | ICD-10-CM | POA: Diagnosis not present

## 2023-09-22 NOTE — Progress Notes (Signed)
 This patient returns to my office for at risk foot care.  This patient requires this care by a professional since this patient will be at risk due to having type 2 diabetes and thrombocytopenia.  This patient is unable to cut nails herself since the patient cannot reach her nails.These nails are painful walking and wearing shoes.  This patient presents for at risk foot care today. She presents to the office with caregiver and in a wheelchair.  General Appearance  Alert, conversant and in no acute stress.  Vascular  Dorsalis pedis and posterior tibial  pulses are palpable  bilaterally.  Capillary return is within normal limits  bilaterally. Temperature is within normal limits  bilaterally.  Neurologic  Senn-Weinstein monofilament wire test within normal limits  bilaterally. Muscle power within normal limits bilaterally.  Nails Thick disfigured discolored nails with subungual debris  from hallux to fifth toes bilaterally. No evidence of bacterial infection or drainage bilaterally.  Orthopedic  No limitations of motion  feet .  No crepitus or effusions noted.  No bony pathology or digital deformities noted.  Skin  normotropic skin with no porokeratosis noted bilaterally.  No signs of infections or ulcers noted.     Onychomycosis  Pain in right toes  Pain in left toes  Consent was obtained for treatment procedures.   Mechanical debridement of nails 1-5  bilaterally performed with a nail nipper.  Filed with dremel without incident.    Return office visit     3 months                Told patient to return for periodic foot care and evaluation due to potential at risk complications.   Ruffin Cotton DPM

## 2023-09-23 DIAGNOSIS — J449 Chronic obstructive pulmonary disease, unspecified: Secondary | ICD-10-CM | POA: Diagnosis not present

## 2023-09-23 DIAGNOSIS — F32A Depression, unspecified: Secondary | ICD-10-CM | POA: Diagnosis not present

## 2023-09-23 DIAGNOSIS — R2689 Other abnormalities of gait and mobility: Secondary | ICD-10-CM | POA: Diagnosis not present

## 2023-09-23 DIAGNOSIS — M6281 Muscle weakness (generalized): Secondary | ICD-10-CM | POA: Diagnosis not present

## 2023-09-23 DIAGNOSIS — I5032 Chronic diastolic (congestive) heart failure: Secondary | ICD-10-CM | POA: Diagnosis not present

## 2023-09-23 DIAGNOSIS — D849 Immunodeficiency, unspecified: Secondary | ICD-10-CM | POA: Diagnosis not present

## 2023-09-23 DIAGNOSIS — M069 Rheumatoid arthritis, unspecified: Secondary | ICD-10-CM | POA: Diagnosis not present

## 2023-09-23 DIAGNOSIS — Z741 Need for assistance with personal care: Secondary | ICD-10-CM | POA: Diagnosis not present

## 2023-09-23 DIAGNOSIS — J9611 Chronic respiratory failure with hypoxia: Secondary | ICD-10-CM | POA: Diagnosis not present

## 2023-09-25 ENCOUNTER — Encounter: Attending: Physical Medicine and Rehabilitation | Admitting: Physical Medicine and Rehabilitation

## 2023-09-25 DIAGNOSIS — F419 Anxiety disorder, unspecified: Secondary | ICD-10-CM | POA: Diagnosis not present

## 2023-09-29 DIAGNOSIS — L89153 Pressure ulcer of sacral region, stage 3: Secondary | ICD-10-CM | POA: Diagnosis not present

## 2023-09-30 DIAGNOSIS — D849 Immunodeficiency, unspecified: Secondary | ICD-10-CM | POA: Diagnosis not present

## 2023-09-30 DIAGNOSIS — M069 Rheumatoid arthritis, unspecified: Secondary | ICD-10-CM | POA: Diagnosis not present

## 2023-09-30 DIAGNOSIS — F32A Depression, unspecified: Secondary | ICD-10-CM | POA: Diagnosis not present

## 2023-09-30 DIAGNOSIS — I10 Essential (primary) hypertension: Secondary | ICD-10-CM | POA: Diagnosis not present

## 2023-09-30 DIAGNOSIS — J9611 Chronic respiratory failure with hypoxia: Secondary | ICD-10-CM | POA: Diagnosis not present

## 2023-09-30 DIAGNOSIS — I5032 Chronic diastolic (congestive) heart failure: Secondary | ICD-10-CM | POA: Diagnosis not present

## 2023-09-30 DIAGNOSIS — I25119 Atherosclerotic heart disease of native coronary artery with unspecified angina pectoris: Secondary | ICD-10-CM | POA: Diagnosis not present

## 2023-09-30 DIAGNOSIS — R2689 Other abnormalities of gait and mobility: Secondary | ICD-10-CM | POA: Diagnosis not present

## 2023-09-30 DIAGNOSIS — M6281 Muscle weakness (generalized): Secondary | ICD-10-CM | POA: Diagnosis not present

## 2023-09-30 DIAGNOSIS — Z741 Need for assistance with personal care: Secondary | ICD-10-CM | POA: Diagnosis not present

## 2023-09-30 DIAGNOSIS — F320 Major depressive disorder, single episode, mild: Secondary | ICD-10-CM | POA: Diagnosis not present

## 2023-09-30 DIAGNOSIS — J449 Chronic obstructive pulmonary disease, unspecified: Secondary | ICD-10-CM | POA: Diagnosis not present

## 2023-10-05 DIAGNOSIS — I5032 Chronic diastolic (congestive) heart failure: Secondary | ICD-10-CM | POA: Diagnosis not present

## 2023-10-05 DIAGNOSIS — J8410 Pulmonary fibrosis, unspecified: Secondary | ICD-10-CM | POA: Diagnosis not present

## 2023-10-05 DIAGNOSIS — I25119 Atherosclerotic heart disease of native coronary artery with unspecified angina pectoris: Secondary | ICD-10-CM | POA: Diagnosis not present

## 2023-10-05 DIAGNOSIS — J189 Pneumonia, unspecified organism: Secondary | ICD-10-CM | POA: Diagnosis not present

## 2023-10-05 DIAGNOSIS — L89312 Pressure ulcer of right buttock, stage 2: Secondary | ICD-10-CM | POA: Diagnosis not present

## 2023-10-05 DIAGNOSIS — J849 Interstitial pulmonary disease, unspecified: Secondary | ICD-10-CM | POA: Diagnosis not present

## 2023-10-05 DIAGNOSIS — M069 Rheumatoid arthritis, unspecified: Secondary | ICD-10-CM | POA: Diagnosis not present

## 2023-10-05 DIAGNOSIS — N183 Chronic kidney disease, stage 3 unspecified: Secondary | ICD-10-CM | POA: Diagnosis not present

## 2023-10-05 DIAGNOSIS — G894 Chronic pain syndrome: Secondary | ICD-10-CM | POA: Diagnosis not present

## 2023-10-05 DIAGNOSIS — E039 Hypothyroidism, unspecified: Secondary | ICD-10-CM | POA: Diagnosis not present

## 2023-10-05 DIAGNOSIS — Z48815 Encounter for surgical aftercare following surgery on the digestive system: Secondary | ICD-10-CM | POA: Diagnosis not present

## 2023-10-05 DIAGNOSIS — I130 Hypertensive heart and chronic kidney disease with heart failure and stage 1 through stage 4 chronic kidney disease, or unspecified chronic kidney disease: Secondary | ICD-10-CM | POA: Diagnosis not present

## 2023-10-05 DIAGNOSIS — F320 Major depressive disorder, single episode, mild: Secondary | ICD-10-CM | POA: Diagnosis not present

## 2023-10-05 DIAGNOSIS — K56699 Other intestinal obstruction unspecified as to partial versus complete obstruction: Secondary | ICD-10-CM | POA: Diagnosis not present

## 2023-10-05 DIAGNOSIS — A419 Sepsis, unspecified organism: Secondary | ICD-10-CM | POA: Diagnosis not present

## 2023-10-05 DIAGNOSIS — E1122 Type 2 diabetes mellitus with diabetic chronic kidney disease: Secondary | ICD-10-CM | POA: Diagnosis not present

## 2023-10-05 DIAGNOSIS — L89322 Pressure ulcer of left buttock, stage 2: Secondary | ICD-10-CM | POA: Diagnosis not present

## 2023-10-05 DIAGNOSIS — M1990 Unspecified osteoarthritis, unspecified site: Secondary | ICD-10-CM | POA: Diagnosis not present

## 2023-10-05 DIAGNOSIS — J440 Chronic obstructive pulmonary disease with acute lower respiratory infection: Secondary | ICD-10-CM | POA: Diagnosis not present

## 2023-10-05 DIAGNOSIS — I471 Supraventricular tachycardia, unspecified: Secondary | ICD-10-CM | POA: Diagnosis not present

## 2023-10-05 DIAGNOSIS — J9610 Chronic respiratory failure, unspecified whether with hypoxia or hypercapnia: Secondary | ICD-10-CM | POA: Diagnosis not present

## 2023-10-05 DIAGNOSIS — M1030 Gout due to renal impairment, unspecified site: Secondary | ICD-10-CM | POA: Diagnosis not present

## 2023-10-05 DIAGNOSIS — D631 Anemia in chronic kidney disease: Secondary | ICD-10-CM | POA: Diagnosis not present

## 2023-10-05 DIAGNOSIS — E1142 Type 2 diabetes mellitus with diabetic polyneuropathy: Secondary | ICD-10-CM | POA: Diagnosis not present

## 2023-10-05 DIAGNOSIS — Z433 Encounter for attention to colostomy: Secondary | ICD-10-CM | POA: Diagnosis not present

## 2023-10-06 DIAGNOSIS — Z48815 Encounter for surgical aftercare following surgery on the digestive system: Secondary | ICD-10-CM | POA: Diagnosis not present

## 2023-10-06 DIAGNOSIS — I5032 Chronic diastolic (congestive) heart failure: Secondary | ICD-10-CM | POA: Diagnosis not present

## 2023-10-06 DIAGNOSIS — E1122 Type 2 diabetes mellitus with diabetic chronic kidney disease: Secondary | ICD-10-CM | POA: Diagnosis not present

## 2023-10-06 DIAGNOSIS — K56699 Other intestinal obstruction unspecified as to partial versus complete obstruction: Secondary | ICD-10-CM | POA: Diagnosis not present

## 2023-10-06 DIAGNOSIS — Z433 Encounter for attention to colostomy: Secondary | ICD-10-CM | POA: Diagnosis not present

## 2023-10-06 DIAGNOSIS — I130 Hypertensive heart and chronic kidney disease with heart failure and stage 1 through stage 4 chronic kidney disease, or unspecified chronic kidney disease: Secondary | ICD-10-CM | POA: Diagnosis not present

## 2023-10-08 DIAGNOSIS — Z433 Encounter for attention to colostomy: Secondary | ICD-10-CM | POA: Diagnosis not present

## 2023-10-08 DIAGNOSIS — I130 Hypertensive heart and chronic kidney disease with heart failure and stage 1 through stage 4 chronic kidney disease, or unspecified chronic kidney disease: Secondary | ICD-10-CM | POA: Diagnosis not present

## 2023-10-08 DIAGNOSIS — E1122 Type 2 diabetes mellitus with diabetic chronic kidney disease: Secondary | ICD-10-CM | POA: Diagnosis not present

## 2023-10-08 DIAGNOSIS — Z48815 Encounter for surgical aftercare following surgery on the digestive system: Secondary | ICD-10-CM | POA: Diagnosis not present

## 2023-10-08 DIAGNOSIS — K56699 Other intestinal obstruction unspecified as to partial versus complete obstruction: Secondary | ICD-10-CM | POA: Diagnosis not present

## 2023-10-08 DIAGNOSIS — I5032 Chronic diastolic (congestive) heart failure: Secondary | ICD-10-CM | POA: Diagnosis not present

## 2023-10-09 ENCOUNTER — Ambulatory Visit: Admitting: Family Medicine

## 2023-10-13 DIAGNOSIS — E1122 Type 2 diabetes mellitus with diabetic chronic kidney disease: Secondary | ICD-10-CM | POA: Diagnosis not present

## 2023-10-13 DIAGNOSIS — I130 Hypertensive heart and chronic kidney disease with heart failure and stage 1 through stage 4 chronic kidney disease, or unspecified chronic kidney disease: Secondary | ICD-10-CM | POA: Diagnosis not present

## 2023-10-13 DIAGNOSIS — K56699 Other intestinal obstruction unspecified as to partial versus complete obstruction: Secondary | ICD-10-CM | POA: Diagnosis not present

## 2023-10-13 DIAGNOSIS — Z433 Encounter for attention to colostomy: Secondary | ICD-10-CM | POA: Diagnosis not present

## 2023-10-13 DIAGNOSIS — I5032 Chronic diastolic (congestive) heart failure: Secondary | ICD-10-CM | POA: Diagnosis not present

## 2023-10-13 DIAGNOSIS — Z48815 Encounter for surgical aftercare following surgery on the digestive system: Secondary | ICD-10-CM | POA: Diagnosis not present

## 2023-10-14 ENCOUNTER — Encounter: Admitting: Internal Medicine

## 2023-10-14 DIAGNOSIS — Z006 Encounter for examination for normal comparison and control in clinical research program: Secondary | ICD-10-CM

## 2023-10-14 DIAGNOSIS — J849 Interstitial pulmonary disease, unspecified: Secondary | ICD-10-CM

## 2023-10-14 NOTE — Research (Signed)
 Title: Chronic Fibrosing Interstitial Lung Disease with Progressive Phenotype Prospective Outcomes (ILD-PRO) Registry   Protocol #: IPF-PRO-SUB, Clinical Trials # [id number], Sponsor: [employer] Protocol Amendment 6 Version dated 30Apr2024  IB: N/A   ICF: [facility] IR[employer] Approved Version 22May2024- Revised 29Jul2024(Applicable for the new ILD-PRO enrolment and extended IPF-PRO cohort) Lab Manual: v2.0.0 dated 06Dec2021 Objectives:  Describe current approaches to diagnosis and treatment of chronic fibrosing ILDs with progressive phenotype  Describe the natural history of chronic fibrosing ILDs with progressive phenotype  Assess quality of life from self-administered participant reported questionnaires for each disease group  Describe participant interactions with the healthcare system, describe treatment practices across multiple institutions for each disease group  Collect biological samples linked to well characterized chronic fibrosing ILDs with progressive phenotype to identify disease biomarkers  Collect data and biological samples that will support future research studies.                                            [facility] Clinical Research Coordinator note:   This visit for Subject [name] with DOB: 05/25/1940 on 10/14/2023 for the above protocol is Visit/Encounter # 3  and is for purpose of research.   The consent for this encounter is under Protocol Version: Protocol Amendment 6 Version dated 30Apr2024  IB: N/A   ICF: [employer] IR[employer] Approved Version 22May2024- Revised 29Jul2024(Applicable for the new ILD-PRO enrolment and extended IPF-PRO cohort) Lab Manual: v2.0.0 dated 06Dec2021 is 3currently IRB approved.   [name]  (Subject/LAR) expressed continued interest and consent in continuing as a study subject. Subject confirmed that there was  no  (NO/YES) change in contact information (e.g. address, telephone, email). Subject thanked  for participation in research and contribution to science.   The Subject was informed that the PI Maire Scot continues to have oversight of the subject's visits and course through relevant discussions, reviews, and also specifically of this visit by routing of this note to the PI.  Questionnaires and labs were collected by [facility]. Further details in the binder.    Signed by  Marylynn Soho  Clinical Research Coordinator  [facility]  [place], [place] 11:15 AM 10/14/2023

## 2023-10-15 ENCOUNTER — Ambulatory Visit (INDEPENDENT_AMBULATORY_CARE_PROVIDER_SITE_OTHER): Admitting: Family Medicine

## 2023-10-15 ENCOUNTER — Encounter: Payer: Self-pay | Admitting: Family Medicine

## 2023-10-15 VITALS — BP 128/76 | HR 95 | Temp 98.3°F | Ht 64.0 in | Wt 203.4 lb

## 2023-10-15 DIAGNOSIS — I5032 Chronic diastolic (congestive) heart failure: Secondary | ICD-10-CM | POA: Diagnosis not present

## 2023-10-15 DIAGNOSIS — E1140 Type 2 diabetes mellitus with diabetic neuropathy, unspecified: Secondary | ICD-10-CM | POA: Diagnosis not present

## 2023-10-15 DIAGNOSIS — N183 Chronic kidney disease, stage 3 unspecified: Secondary | ICD-10-CM

## 2023-10-15 DIAGNOSIS — I130 Hypertensive heart and chronic kidney disease with heart failure and stage 1 through stage 4 chronic kidney disease, or unspecified chronic kidney disease: Secondary | ICD-10-CM | POA: Diagnosis not present

## 2023-10-15 DIAGNOSIS — Z933 Colostomy status: Secondary | ICD-10-CM

## 2023-10-15 DIAGNOSIS — I10 Essential (primary) hypertension: Secondary | ICD-10-CM | POA: Diagnosis not present

## 2023-10-15 DIAGNOSIS — M05742 Rheumatoid arthritis with rheumatoid factor of left hand without organ or systems involvement: Secondary | ICD-10-CM

## 2023-10-15 DIAGNOSIS — K56699 Other intestinal obstruction unspecified as to partial versus complete obstruction: Secondary | ICD-10-CM | POA: Diagnosis not present

## 2023-10-15 DIAGNOSIS — Z433 Encounter for attention to colostomy: Secondary | ICD-10-CM | POA: Diagnosis not present

## 2023-10-15 DIAGNOSIS — J849 Interstitial pulmonary disease, unspecified: Secondary | ICD-10-CM | POA: Diagnosis not present

## 2023-10-15 DIAGNOSIS — E1122 Type 2 diabetes mellitus with diabetic chronic kidney disease: Secondary | ICD-10-CM | POA: Diagnosis not present

## 2023-10-15 DIAGNOSIS — M25552 Pain in left hip: Secondary | ICD-10-CM

## 2023-10-15 DIAGNOSIS — M05741 Rheumatoid arthritis with rheumatoid factor of right hand without organ or systems involvement: Secondary | ICD-10-CM | POA: Diagnosis not present

## 2023-10-15 DIAGNOSIS — Z48815 Encounter for surgical aftercare following surgery on the digestive system: Secondary | ICD-10-CM | POA: Diagnosis not present

## 2023-10-15 LAB — COMPREHENSIVE METABOLIC PANEL WITH GFR
ALT: 39 U/L — ABNORMAL HIGH (ref 0–35)
AST: 30 U/L (ref 0–37)
Albumin: 3.2 g/dL — ABNORMAL LOW (ref 3.5–5.2)
Alkaline Phosphatase: 101 U/L (ref 39–117)
BUN: 19 mg/dL (ref 6–23)
CO2: 26 meq/L (ref 19–32)
Calcium: 8.1 mg/dL — ABNORMAL LOW (ref 8.4–10.5)
Chloride: 106 meq/L (ref 96–112)
Creatinine, Ser: 0.94 mg/dL (ref 0.40–1.20)
GFR: 56.76 mL/min — ABNORMAL LOW (ref >60.00)
Glucose, Bld: 109 mg/dL — ABNORMAL HIGH (ref 70–99)
Potassium: 4.4 meq/L (ref 3.5–5.1)
Sodium: 140 meq/L (ref 135–145)
Total Bilirubin: 0.4 mg/dL (ref 0.2–1.2)
Total Protein: 5.3 g/dL — ABNORMAL LOW (ref 6.0–8.3)

## 2023-10-15 LAB — CBC WITH DIFFERENTIAL/PLATELET
Basophils Absolute: 0.1 10*3/uL (ref 0.0–0.1)
Basophils Relative: 0.8 % (ref 0.0–3.0)
Eosinophils Absolute: 0 10*3/uL (ref 0.0–0.7)
Eosinophils Relative: 0.3 % (ref 0.0–5.0)
HCT: 30.6 % — ABNORMAL LOW (ref 36.0–46.0)
Hemoglobin: 9.6 g/dL — ABNORMAL LOW (ref 12.0–15.0)
Lymphocytes Relative: 9.1 % — ABNORMAL LOW (ref 12.0–46.0)
Lymphs Abs: 1.2 10*3/uL (ref 0.7–4.0)
MCHC: 31.4 g/dL (ref 30.0–36.0)
MCV: 90.9 fl (ref 78.0–100.0)
Monocytes Absolute: 1.2 10*3/uL — ABNORMAL HIGH (ref 0.1–1.0)
Monocytes Relative: 8.7 % (ref 3.0–12.0)
Neutro Abs: 11 10*3/uL — ABNORMAL HIGH (ref 1.4–7.7)
Neutrophils Relative %: 81.1 % — ABNORMAL HIGH (ref 43.0–77.0)
Platelets: 153 10*3/uL (ref 150.0–400.0)
RBC: 3.37 Mil/uL — ABNORMAL LOW (ref 3.87–5.11)
RDW: 15.8 % — ABNORMAL HIGH (ref 11.5–15.5)
WBC: 13.6 10*3/uL — ABNORMAL HIGH (ref 4.0–10.5)

## 2023-10-15 LAB — GLUCOSE, POCT (MANUAL RESULT ENTRY): POC Glucose: 114 mg/dL — AB (ref 70–99)

## 2023-10-15 LAB — POCT GLYCOSYLATED HEMOGLOBIN (HGB A1C): Hemoglobin A1C: 6 % — AB (ref 4.0–5.6)

## 2023-10-15 MED ORDER — PREDNISONE 10 MG PO TABS
10.0000 mg | ORAL_TABLET | Freq: Every day | ORAL | 3 refills | Status: DC
Start: 2023-10-15 — End: 2023-12-23

## 2023-10-15 NOTE — Progress Notes (Signed)
 Established Patient Office Visit   Subjective  Patient ID: [name], female    DOB: 22-Jul-1940  Age: 83 y.o. MRN: [id number]  Chief Complaint  Patient presents with   Medical Management of Chronic Issues    Follow-up , Hospital stay 3/26-3/28 for chest pain,   Pt accompanied by her son.  Pt is a n 83 yo female seen for f/u.  Pt hospitalized 3/26-3/28/25.  She feels better overall but continues to experience significant fatigue and a sense of being 'out of it.' Her breathing has improved, and she has not needed to increase her oxygen  use. She occasionally experiences chest pressure, which has been ongoing for a couple of months. Lying down provides some relief. No current chest pain, minimal wheezing, and occasional chest tightness.  There is a reduction in leg swelling compared to previous episodes and less frequent pain. However, she describes soreness in her legs, particularly in one leg, and mentions a sore area in the back of the leg. No significant leg swelling and reduced pain frequency.  She has been working on regaining mobility and strength through physical therapy and occupational therapy. She mentions having an ostomy, which has been a significant change for her. She is trying to walk more and perform exercises, even while sitting, to regain lost mobility.  Regarding her medications, she was taken off spironolactone , her rheumatoid arthritis medication, and her blood sugar medication during a period of severe illness. She has not resumed these medications and is seeking guidance on whether they should be restarted. She has not been monitoring her blood sugar at home but is open to having it checked during the visit. Her blood pressure has stabilized after being previously uncontrolled during her illness. She has been monitoring her blood pressure at home.  She experiences hip pain, particularly at night, which she attributes to her sleeping position and the presence of a  rod and screws in her hip. She uses a heating pad and topical treatments for relief and sometimes places a pillow under her knees or to the side for comfort. She also experiences stomach discomfort when bending over.    Patient Active Problem List   Diagnosis Date Noted   Pain due to onychomycosis of toenails of both feet 09/22/2023   Sepsis (HCC) 08/12/2023   Anxiety state 07/24/2023   Septic shock (HCC) 07/20/2023   Type 2 diabetes mellitus with diabetic polyneuropathy, without long-term current use of insulin  (HCC) 07/20/2023   Iron  deficiency anemia 07/20/2023   Hyperlipidemia 07/20/2023   Chronic diastolic congestive heart failure (HCC) 07/20/2023   Abdominal pain 07/16/2023   GI bleed 07/15/2023   Enterocolitis 06/30/2023   Sepsis due to undetermined organism (HCC) 06/30/2023   Hypothyroidism 06/30/2023   GERD without esophagitis 06/30/2023   Peripheral neuropathy 06/30/2023   RSV (respiratory syncytial virus pneumonia) 06/26/2023   Ileus (HCC) 06/23/2023   Lower GI bleed 12/16/2022   BRBPR (bright red blood per rectum) 12/15/2022   [name] 09/08/2022   Abnormal CT scan, sigmoid colon 04/14/2022   Colon stricture (HCC) 04/14/2022   Diverticulosis of colon without hemorrhage 04/14/2022   History of pulmonary embolus (PE) 11/15/2021   Tachycardia 10/22/2021   Pulmonary HTN (HCC)    Unspecified protein-calorie malnutrition (HCC) 04/05/2021   Pressure injury of skin 03/31/2021   Thrombocytopenia (HCC) 03/30/2021   Diabetic neuropathy (HCC) 05/24/2019   Hypokalemia 04/21/2019   Anticoagulated 06/25/2018   Chronic pain 06/25/2018   CRI (chronic  renal insufficiency), stage 3 (moderate) (HCC) 06/25/2018   Drug-induced constipation 06/18/2018   DVT (deep venous thrombosis) (HCC) 06/03/2018   Chest pain 06/02/2018   Primary osteoarthritis of both knees 05/26/2018   Body mass index (BMI) of 50-59.9 in adult (HCC) 05/26/2018   Acquired  hypothyroidism 05/26/2018   Chronic bilateral low back pain without sciatica 02/17/2018   Astigmatism with presbyopia, bilateral 03/26/2017   Cortical age-related cataract of both eyes 03/26/2017   Family history of glaucoma 03/26/2017   Long term current use of oral hypoglycemic drug 03/26/2017   Nuclear sclerotic cataract of both eyes 03/26/2017   Pain in left hip 02/18/2017   Gout 11/29/2016   Depression 11/29/2016   Physical deconditioning 07/31/2015   Pulmonary fibrosis, postinflammatory (HCC) 07/31/2015   Bronchiectasis without complication (HCC) 07/31/2015   Tendinitis of left rotator cuff 06/13/2015   Chronic heart failure with preserved ejection fraction (HFpEF) (HCC) 04/21/2015   AKI (acute kidney injury) (HCC)    Chronic respiratory failure with hypoxia (HCC) 02/09/2015   Dyspnea and respiratory abnormality 01/03/2015   Normal coronary arteries 11/02/2014   Morbid obesity (HCC) 10/30/2014   Dysphagia 08/21/2014   Debility 06/21/2014   DOE (dyspnea on exertion) 05/24/2014   Primary osteoarthritis of left knee 04/26/2014   High risk medication use 04/17/2014   Aphasia 02/12/2014   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) 01/18/2014   Lumbar facet arthropathy 01/18/2014   Bilateral edema of lower extremity 05/30/2013   Chronic cough 05/24/2013   ILD (interstitial lung disease) (HCC) 04/10/2013   Spinal stenosis of lumbar region with radiculopathy 07/12/2012   Inability to walk 07/12/2012   Meningioma of left sphenoid wing involving cavernous sinus (HCC) 02/17/2012   Rheumatoid arthritis (HCC) 01/28/2012   Primary osteoarthritis of right knee 01/28/2012   Dyslipidemia    Essential hypertension    Past Medical History:  Diagnosis Date   Acute encephalopathy 03/30/2021   11/12 - 04/03/2021 acute encephalopathy in the context of altered mental status and poor oral intake following increasing pain medicines and associated Klebsiella UTI.     Acute kidney  injury superimposed on chronic kidney disease (HCC) 04/17/2015   11/12 - 04/03/2021 AKI with creatinine of 2.22 and GFR 22 indicating stage IV renal disease in the context of encephalopathy secondary to increased pain medications with decreased oral intake of fluids and nutrition.     Acute on chronic diastolic (congestive) heart failure (HCC)    Anemia    iron  deficiency anemia - secondary to blood loss ( chronic)    Anxiety    Arthritis    endstage changes bilateral knees/bilateral ankles.    Asthma    Carotid artery occlusion    Chest pain of uncertain etiology 01/28/2012   Normal coronaries at cath     Chronic fatigue    Chronic kidney disease    Closed intertrochanteric fracture of hip, left, initial encounter (HCC) 11/29/2016   Closed left hip fracture (HCC)    Closed nondisplaced intertrochanteric fracture of left femur with delayed healing 01/14/2017   Clotting disorder (HCC)    pt denies this   Contusion of left knee    due to fall 1/14.   COPD (chronic obstructive pulmonary disease) (HCC)    pulmonary fibrosis   Coronary artery disease    Depression, reactive    Diabetes mellitus    type II    Diastolic dysfunction    Difficulty in walking    Family history of heart disease  Flu 06/26/2023   Generalized muscle weakness    Gout    High cholesterol    History of falling    Hypertension    Hypothyroidism    Interstitial lung disease (HCC)    Meningioma of left sphenoid wing involving cavernous sinus (HCC) 02/17/2012   Continue diplopia, left eye pain and left headaches.     Morbid obesity (HCC)    Neuromuscular disorder (HCC)    diabetic neuropathy    Normal coronary arteries    cardiac catheterization performed  10/31/14   Pneumonia    RA (rheumatoid arthritis) (HCC)    has been off methotreaxte since 10/13.   Septic shock (HCC) 06/13/2023   Spinal stenosis of lumbar region    Thyroid  disease    Unspecified lack of coordination    URI (upper respiratory  infection)    UTI (urinary tract infection) 03/31/2021   11/12 - 04/03/2021 Klebsiella UTI associated with encephalopathy in the context of AKI.  Status post IV ceftriaxone  followed by oral Keflex  x48 hours.     Past Surgical History:  Procedure Laterality Date   BRAIN SURGERY     Gamma knife 10/13. Needs repeat spring  '14   CARDIAC CATHETERIZATION N/A 10/31/2014   Procedure: Right/Left Heart Cath and Coronary Angiography;  Surgeon: Lucendia Rusk, MD;  Location: [facility] INVASIVE CV LAB;  Service: Cardiovascular;  Laterality: N/A;   COLONOSCOPY WITH PROPOFOL  N/A 04/14/2022   Procedure: COLONOSCOPY WITH PROPOFOL ;  Surgeon: Nannette Babe, MD;  Location: [facility] ENDOSCOPY;  Service: Gastroenterology;  Laterality: N/A;   CYST EXCISION     2 on face   CYST REMOVAL NECK     ESOPHAGOGASTRODUODENOSCOPY (EGD) WITH PROPOFOL  N/A 09/14/2014   Procedure: ESOPHAGOGASTRODUODENOSCOPY (EGD) WITH PROPOFOL ;  Surgeon: Claudette Cue, MD;  Location: [facility] ENDOSCOPY;  Service: Endoscopy;  Laterality: N/A;   INCISION AND DRAINAGE HIP Left 01/16/2017   Procedure: IRRIGATION AND DEBRIDEMENT LEFT HIP;  Surgeon: Arnie Lao, MD;  Location: [facility] ORS;  Service: Orthopedics;  Laterality: Left;   INTRAMEDULLARY (IM) NAIL INTERTROCHANTERIC Left 11/29/2016   Procedure: INTRAMEDULLARY (IM) NAIL INTERTROCHANTRIC;  Surgeon: Arnie Lao, MD;  Location: [facility] OR;  Service: Orthopedics;  Laterality: Left;   LAPAROSCOPIC LOOP COLOSTOMY N/A 07/17/2023   Procedure: LAPAROSCOPIC DIVERTING LOOP COLOSTOMY;  Surgeon: Dareen Ebbing, MD;  Location: [facility] OR;  Service: General;  Laterality: N/A;   OVARY SURGERY     RIGHT HEART CATH N/A 08/12/2021   Procedure: RIGHT HEART CATH;  Surgeon: Avanell Leigh, MD;  Location: [facility] INVASIVE CV LAB;  Service: Cardiovascular;  Laterality: N/A;   SHOULDER SURGERY Left    TONSILLECTOMY  age 13   VAGINAL HYSTERECTOMY     VIDEO BRONCHOSCOPY Bilateral 05/31/2013   Procedure: VIDEO  BRONCHOSCOPY WITHOUT FLUORO;  Surgeon: Maire Scot, MD;  Location: [facility] ENDOSCOPY;  Service: Cardiopulmonary;  Laterality: Bilateral;   video bronscoscopy  05/19/1998   lung   Social History   Tobacco Use   Smoking status: Never   Smokeless tobacco: Never  Vaping Use   Vaping status: Never Used  Substance Use Topics   Alcohol  use: No   Drug use: No   Family History  Problem Relation Age of Onset   Diabetes Mother    Heart attack Mother    Hypertension Father    Lung cancer Father    Diabetes Sister    Breast cancer Sister    Breast cancer Sister    Diabetes Brother    Hypertension Brother  Heart disease Brother    Heart attack Brother    Kidney cancer Brother    Gout Brother    Kidney failure Brother        x 5   Esophageal cancer Brother    Stomach cancer Brother    Prostate cancer Brother    Uterine cancer Daughter        with mets   Fibroids Daughter    Hypertension Daughter    Hypertension Daughter    Hypertension Son    Heart murmur Son    Rheum arthritis Maternal Uncle    Alzheimer's disease Neg Hx    Dementia Neg Hx    Allergies  Allergen Reactions   Codeine Swelling and Other (See Comments)    Facial and leg swelling Chest pain    Remicade [Infliximab] Anaphylaxis    "sent me into shock"    Zestril [Lisinopril] Swelling and Rash    Face and neck swelling    Ofev  [Nintedanib] Diarrhea    ROS Negative unless stated above    Objective:      BP 128/76 (BP Location: Right Wrist, Patient Position: Sitting, Cuff Size: Normal)   Pulse 95   Temp 98.3 F (36.8 C) (Oral)   Ht 5\' 4"  (1.626 m)   Wt 203 lb 6.4 oz (92.3 kg)   SpO2 93%   BMI 34.91 kg/m  BP Readings from Last 3 Encounters:  10/15/23 128/76  09/10/23 130/74  08/14/23 113/69   Wt Readings from Last 3 Encounters:  10/15/23 203 lb 6.4 oz (92.3 kg)  09/10/23 188 lb (85.3 kg)  08/12/23 191 lb 5.8 oz (86.8 kg)      Physical Exam     10/15/2023    2:45 PM 06/12/2023    12:05 PM 06/12/2023   12:04 PM  Depression screen PHQ 2/9  Decreased Interest 2  2  Down, Depressed, Hopeless 1  1  PHQ - 2 Score 3  3  Altered sleeping 1  3  Tired, decreased energy   3  Change in appetite 1  3  Feeling bad or failure about yourself  0  1  Trouble concentrating 0  1  Moving slowly or fidgety/restless 0  3  Suicidal thoughts 0  0  PHQ-9 Score 5  17  Difficult doing work/chores Somewhat difficult Somewhat difficult       10/15/2023    2:46 PM 06/12/2023   12:04 PM 03/02/2023    1:24 PM 09/03/2022   10:11 AM  GAD 7 : Generalized Anxiety Score  Nervous, Anxious, on Edge 1 2 2 2   Control/stop worrying 1 3 1 1   Worry too much - different things 1 3 1 1   Trouble relaxing 1 3 3    Restless 0 1 1 1   Easily annoyed or irritable 1 3 1 1   Afraid - awful might happen 0 0 0 0  Total GAD 7 Score 5 15 9    Anxiety Difficulty Somewhat difficult Somewhat difficult       No results found for any visits on 10/15/23.    Assessment & Plan:   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) -     POCT glycosylated hemoglobin (Hb A1C) -     POCT glucose (manual entry)  Chronic diastolic CHF (congestive heart failure) (HCC) -     Comprehensive metabolic panel with GFR; Future  Essential hypertension -     Comprehensive metabolic panel with GFR; Future  CRI (chronic renal insufficiency), stage 3 (  moderate) (HCC) -     Comprehensive metabolic panel with GFR; Future  Rheumatoid arthritis involving both hands with positive rheumatoid factor (HCC) -     CBC with Differential/Platelet; Future -     predniSONE ; Take 1 tablet (10 mg total) by mouth daily with breakfast.  Dispense: 90 tablet; Refill: 3  Colostomy status (HCC) -     Comprehensive metabolic panel with GFR; Future  Pain in left hip -     CBC with Differential/Platelet; Future  ILD (interstitial lung disease) (HCC)  Ostomy status   She is adjusting to her new ostomy with the help of a nurse and support  staff. Continue collaboration with her for effective ostomy management.  HFpEF Nearly euvolemic.  Mild edema in bilateral LEs close to baseline.  Spironolactone  discontinued at discharge.  Continue other medications including Lasix  40 mg daily, metoprolol  tartrate 62.5 mg twice daily (taking a 50 mg tab and half a 25 mg tab (12.5 mg) to create total dose).  Chronic pain in hip with hardware   Her chronic hip pain, exacerbated by weather and certain positions, is managed with a heating pad and topical treatments. Continue these methods for pain relief and use a pillow for support while sleeping.  Rheumatoid arthritis   Previously on Prednisone  10 mg daily.  Appears to have been inadvertently d/c'd at hospital d/c.  Restart.  Rx sent to pharmacy.  Fatigue   Persistent fatigue is likely due to recent hospitalization and chronic health issues. She is undergoing therapy to regain strength. Encourage continuation of physical and occupational therapy and advise gradually increasing daily activity.  Chest pressure   She experiences intermittent chest pressure relieved by lying down, possibly related to Iron  def anemia.  Also consider h/o interstitial lung disease.  Follow-up with pulm and cards.  Diabetes mellitus   Semaglutide  discontinued during hospitalization, and current control needs assessment. Check blood sugar level via finger prick during the visit and order an A1c test to assess current diabetes control.  Restarting prednisone  will cause increase in bs.  Will have patient monitor blood sugar every morning and keep a log of readings.  Notify clinic for elevations >150 in AM.  ILD/pulmonary HTN Stable.  Continue 3 L O2 via [place].  Continue inhalers.  Continue follow-up with pulmonology here and at [facility]  No follow-ups on file.   Viola Greulich, MD

## 2023-10-15 NOTE — Patient Instructions (Addendum)
 Your current blood sugar is 114 and your hemoglobin A1C is 6%.  OK to continue monitoring blood sugar.  For elevations >160 in the mornings fasting, will start medication.

## 2023-10-16 DIAGNOSIS — I130 Hypertensive heart and chronic kidney disease with heart failure and stage 1 through stage 4 chronic kidney disease, or unspecified chronic kidney disease: Secondary | ICD-10-CM | POA: Diagnosis not present

## 2023-10-16 DIAGNOSIS — I5032 Chronic diastolic (congestive) heart failure: Secondary | ICD-10-CM | POA: Diagnosis not present

## 2023-10-16 DIAGNOSIS — K56699 Other intestinal obstruction unspecified as to partial versus complete obstruction: Secondary | ICD-10-CM | POA: Diagnosis not present

## 2023-10-16 DIAGNOSIS — Z433 Encounter for attention to colostomy: Secondary | ICD-10-CM | POA: Diagnosis not present

## 2023-10-16 DIAGNOSIS — Z48815 Encounter for surgical aftercare following surgery on the digestive system: Secondary | ICD-10-CM | POA: Diagnosis not present

## 2023-10-16 DIAGNOSIS — E1122 Type 2 diabetes mellitus with diabetic chronic kidney disease: Secondary | ICD-10-CM | POA: Diagnosis not present

## 2023-10-19 ENCOUNTER — Ambulatory Visit: Payer: Self-pay | Admitting: Family Medicine

## 2023-10-19 DIAGNOSIS — Z433 Encounter for attention to colostomy: Secondary | ICD-10-CM | POA: Diagnosis not present

## 2023-10-19 DIAGNOSIS — I130 Hypertensive heart and chronic kidney disease with heart failure and stage 1 through stage 4 chronic kidney disease, or unspecified chronic kidney disease: Secondary | ICD-10-CM | POA: Diagnosis not present

## 2023-10-19 DIAGNOSIS — E1122 Type 2 diabetes mellitus with diabetic chronic kidney disease: Secondary | ICD-10-CM | POA: Diagnosis not present

## 2023-10-19 DIAGNOSIS — Z48815 Encounter for surgical aftercare following surgery on the digestive system: Secondary | ICD-10-CM | POA: Diagnosis not present

## 2023-10-19 DIAGNOSIS — K56699 Other intestinal obstruction unspecified as to partial versus complete obstruction: Secondary | ICD-10-CM | POA: Diagnosis not present

## 2023-10-19 DIAGNOSIS — I5032 Chronic diastolic (congestive) heart failure: Secondary | ICD-10-CM | POA: Diagnosis not present

## 2023-10-22 DIAGNOSIS — Z433 Encounter for attention to colostomy: Secondary | ICD-10-CM | POA: Diagnosis not present

## 2023-10-22 DIAGNOSIS — E1122 Type 2 diabetes mellitus with diabetic chronic kidney disease: Secondary | ICD-10-CM | POA: Diagnosis not present

## 2023-10-22 DIAGNOSIS — Z48815 Encounter for surgical aftercare following surgery on the digestive system: Secondary | ICD-10-CM | POA: Diagnosis not present

## 2023-10-22 DIAGNOSIS — I130 Hypertensive heart and chronic kidney disease with heart failure and stage 1 through stage 4 chronic kidney disease, or unspecified chronic kidney disease: Secondary | ICD-10-CM | POA: Diagnosis not present

## 2023-10-22 DIAGNOSIS — K56699 Other intestinal obstruction unspecified as to partial versus complete obstruction: Secondary | ICD-10-CM | POA: Diagnosis not present

## 2023-10-22 DIAGNOSIS — I5032 Chronic diastolic (congestive) heart failure: Secondary | ICD-10-CM | POA: Diagnosis not present

## 2023-10-23 ENCOUNTER — Telehealth: Payer: Self-pay | Admitting: Gastroenterology

## 2023-10-23 NOTE — Telephone Encounter (Signed)
 Patient reports that she just recently got out of rehab after being in and out of the hospital since January of this year. About 1 month ago patient had surgery to have a colostomy bag placed. She reports that every time she eats she gets severe LLQ pain and cramping. Denies N/V. Reports her colostomy bag has been producing brown loose to mushy stools and will fill about 1/2 of the the bag at a time. Patient also reports that she is having lots of gas. Reports she is having to burp her bag at least 1-2 times per day. Patient states that her primary requested that she schedule a f/u visit with our office due to a drop in her HGB from 10.4 to 9.6. Follow up appointment scheduled for 6/18.

## 2023-10-23 NOTE — Telephone Encounter (Signed)
 Called and verified with patient, she denies any active bleeding into her colostomy bag.

## 2023-10-23 NOTE — Telephone Encounter (Signed)
 PT is calling to speak with a nurse regarding her symptoms. Her hemoglobin has dropped to 9.6. She is in severe pain after eating on her left side and has abdominal pain in general. She stated that she recently had a colostomy bag placed as well. Please advise.

## 2023-10-23 NOTE — Telephone Encounter (Signed)
 It looks like this is actually a patient of Dr. Leonia Raman. She saw her as a new patient in office in 2022 and she had another office visit with her in 2024. Dr. Bridgett Camps did a colonoscopy on her, but he sent results to Dr. Leonia Raman. The patient denied any active blood in her stool.

## 2023-10-27 DIAGNOSIS — K56699 Other intestinal obstruction unspecified as to partial versus complete obstruction: Secondary | ICD-10-CM | POA: Diagnosis not present

## 2023-10-27 DIAGNOSIS — I130 Hypertensive heart and chronic kidney disease with heart failure and stage 1 through stage 4 chronic kidney disease, or unspecified chronic kidney disease: Secondary | ICD-10-CM | POA: Diagnosis not present

## 2023-10-27 DIAGNOSIS — Z433 Encounter for attention to colostomy: Secondary | ICD-10-CM | POA: Diagnosis not present

## 2023-10-27 DIAGNOSIS — Z48815 Encounter for surgical aftercare following surgery on the digestive system: Secondary | ICD-10-CM | POA: Diagnosis not present

## 2023-10-27 DIAGNOSIS — E1122 Type 2 diabetes mellitus with diabetic chronic kidney disease: Secondary | ICD-10-CM | POA: Diagnosis not present

## 2023-10-27 DIAGNOSIS — I5032 Chronic diastolic (congestive) heart failure: Secondary | ICD-10-CM | POA: Diagnosis not present

## 2023-10-30 ENCOUNTER — Ambulatory Visit (INDEPENDENT_AMBULATORY_CARE_PROVIDER_SITE_OTHER): Payer: Medicare Other

## 2023-10-30 VITALS — Ht 64.0 in | Wt 209.0 lb

## 2023-10-30 DIAGNOSIS — Z48815 Encounter for surgical aftercare following surgery on the digestive system: Secondary | ICD-10-CM | POA: Diagnosis not present

## 2023-10-30 DIAGNOSIS — I130 Hypertensive heart and chronic kidney disease with heart failure and stage 1 through stage 4 chronic kidney disease, or unspecified chronic kidney disease: Secondary | ICD-10-CM | POA: Diagnosis not present

## 2023-10-30 DIAGNOSIS — K56699 Other intestinal obstruction unspecified as to partial versus complete obstruction: Secondary | ICD-10-CM | POA: Diagnosis not present

## 2023-10-30 DIAGNOSIS — E1122 Type 2 diabetes mellitus with diabetic chronic kidney disease: Secondary | ICD-10-CM | POA: Diagnosis not present

## 2023-10-30 DIAGNOSIS — I5032 Chronic diastolic (congestive) heart failure: Secondary | ICD-10-CM | POA: Diagnosis not present

## 2023-10-30 DIAGNOSIS — Z0000 Encounter for general adult medical examination without abnormal findings: Secondary | ICD-10-CM | POA: Diagnosis not present

## 2023-10-30 DIAGNOSIS — Z433 Encounter for attention to colostomy: Secondary | ICD-10-CM | POA: Diagnosis not present

## 2023-10-30 NOTE — Progress Notes (Signed)
 Subjective:   [name] is a 83 y.o. who presents for a Medicare Wellness preventive visit.  As a reminder, Annual Wellness Visits don't include a physical exam, and some assessments may be limited, especially if this visit is performed virtually. We may recommend an in-person follow-up visit with your provider if needed.  Visit Complete: Virtual I connected with  Hedy F Sloane on 10/30/23 by a audio enabled telemedicine application and verified that I am speaking with the correct person using two identifiers.  Patient Location: Home  Provider Location: Home Office  I discussed the limitations of evaluation and management by telemedicine. The patient expressed understanding and agreed to proceed.  Vital Signs: Because this visit was a virtual/telehealth visit, some criteria may be missing or patient reported. Any vitals not documented were not able to be obtained and vitals that have been documented are patient reported.    Persons Participating in Visit: Patient.  AWV Questionnaire: No: Patient Medicare AWV questionnaire was not completed prior to this visit.  Cardiac Risk Factors include: advanced age (>62men, >25 women);hypertension     Objective:    Today's Vitals   10/30/23 1612  Weight: 209 lb (94.8 kg)  Height: 5' 4 (1.626 m)   Body mass index is 35.87 kg/m.     10/30/2023    4:23 PM 08/12/2023    3:00 PM 08/12/2023    1:03 PM 07/20/2023    4:29 PM 07/20/2023    4:00 PM 07/17/2023    1:11 PM 07/16/2023   12:27 PM  Advanced Directives  Does Patient Have a Medical Advance Directive? Yes No No  No No No  Type of Estate agent of [place];Living will     Out of facility DNR (pink MOST or yellow form)   Does patient want to make changes to medical advance directive? No - Patient declined No - Patient declined   No - Patient declined    Copy of Healthcare Power of Attorney in Chart? Yes - validated most recent copy scanned in chart (See row  information)        Would patient like information on creating a medical advance directive?  No - Patient declined  No - Patient declined   No - Patient declined    Current Medications (verified) Outpatient Encounter Medications as of 10/30/2023  Medication Sig   acetaminophen  (TYLENOL ) 500 MG tablet Take 1,000 mg by mouth every 8 (eight) hours as needed (pain).   albuterol  (VENTOLIN  HFA) 108 (90 Base) MCG/ACT inhaler Inhale 2 puffs into the lungs in the morning and at bedtime.   atorvastatin  (LIPITOR) 10 MG tablet Take 1 tablet (10 mg total) by mouth daily. (Patient taking differently: Take 10 mg by mouth at bedtime.)   azithromycin  (ZITHROMAX ) 250 MG tablet One tab m-wed -friday   bisacodyl  (DULCOLAX) 10 MG suppository Place 10 mg rectally daily as needed (bowel management).   Cholecalciferol  (VITAMIN D3) 50 MCG (2000 UT) TABS Take 2,000 Units by mouth daily.   diclofenac  Sodium (VOLTAREN ) 1 % GEL Apply 2 g topically 3 (three) times daily.   DULoxetine  (CYMBALTA ) 60 MG capsule Take 1 capsule (60 mg total) by mouth daily.   furosemide  (LASIX ) 40 MG tablet Take 1 tablet (40 mg total) by mouth daily.   gabapentin  (NEURONTIN ) 300 MG capsule Take 1 capsule (300 mg total) by mouth 2 (two) times daily.   guaiFENesin  (MUCINEX ) 600 MG 12 hr tablet Take 2 tablets (1,200 mg total) by mouth 2 (two)  times daily.   hydroxychloroquine  (PLAQUENIL ) 200 MG tablet Take 1 tablet (200 mg total) by mouth daily.   leflunomide  (ARAVA ) 20 MG tablet Take 1 tablet (20 mg total) by mouth daily.   leptospermum manuka honey (MEDIHONEY) PSTE paste Apply 1 Application topically daily. Interchangeable with TheraHoney Apply thin layer (3 mm) to wound.   levothyroxine  (SYNTHROID ) 50 MCG tablet Take 1 tablet (50 mcg total) by mouth daily. (Patient taking differently: Take 50 mcg by mouth daily before breakfast.)   lidocaine  (LIDODERM ) 5 % Place 1 patch onto the skin daily. Remove & Discard patch within 12 hours or as directed  by MD   magnesium  hydroxide (MILK OF MAGNESIA) 400 MG/5ML suspension Take 30 mLs by mouth daily as needed (no BM in 3 days).   Magnesium  Oxide 250 MG TABS Take 1 tablet (250 mg total) by mouth daily.   methocarbamol  (ROBAXIN ) 500 MG tablet Take 1 tablet (500 mg total) by mouth every 6 (six) hours as needed for muscle spasms (pain).   METOPROLOL  TARTRATE PO Take 62.5 mg by mouth every 12 (twelve) hours.   morphine  (MS CONTIN ) 15 MG 12 hr tablet Take 1 tablet (15 mg total) by mouth every 12 (twelve) hours.   Multiple Vitamin (MULTIVITAMIN) tablet Take 1 tablet by mouth daily.   nitroGLYCERIN  (NITROSTAT ) 0.4 MG SL tablet Place 1 tablet (0.4 mg total) under the tongue every 5 (five) minutes as needed for chest pain.   OXYGEN  Inhale 3 L/min into the lungs at bedtime.   pantoprazole  (PROTONIX ) 40 MG tablet Take 1 tablet (40 mg total) by mouth 2 (two) times daily. (Patient taking differently: Take 40 mg by mouth daily before breakfast.)   polyethylene glycol powder (GLYCOLAX /MIRALAX ) 17 GM/SCOOP powder Take 17 g by mouth daily as needed (bowel management).   potassium chloride  SA (KLOR-CON  M) 20 MEQ tablet Take 2 tablets (40 mEq total) by mouth 2 (two) times daily.   predniSONE  (DELTASONE ) 10 MG tablet Take 1 tablet (10 mg total) by mouth daily with breakfast.   senna (SENOKOT) 8.6 MG TABS tablet Take 17.2 mg by mouth daily as needed (bowel management).   Sodium Phosphates  (ENEMA) ENEM Place 1 enema rectally daily as needed (constipation not relieved by bisacodyl  suppository).   ZINC  OXIDE EX Apply 1 application  topically See admin instructions. Apply topically to sacral area twice daily with morning/night shift and as needed with each incontinent episode.   No facility-administered encounter medications on file as of 10/30/2023.    Allergies (verified) Codeine, Remicade [infliximab], Zestril [lisinopril], and Ofev  [nintedanib]   History: Past Medical History:  Diagnosis Date   Acute  encephalopathy 03/30/2021   11/12 - 04/03/2021 acute encephalopathy in the context of altered mental status and poor oral intake following increasing pain medicines and associated Klebsiella UTI.     Acute kidney injury superimposed on chronic kidney disease (HCC) 04/17/2015   11/12 - 04/03/2021 AKI with creatinine of 2.22 and GFR 22 indicating stage IV renal disease in the context of encephalopathy secondary to increased pain medications with decreased oral intake of fluids and nutrition.     Acute on chronic diastolic (congestive) heart failure (HCC)    Anemia    iron  deficiency anemia - secondary to blood loss ( chronic)    Anxiety    Arthritis    endstage changes bilateral knees/bilateral ankles.    Asthma    Carotid artery occlusion    Chest pain of uncertain etiology 01/28/2012   Normal coronaries at cath  Chronic fatigue    Chronic kidney disease    Closed intertrochanteric fracture of hip, left, initial encounter (HCC) 11/29/2016   Closed left hip fracture (HCC)    Closed nondisplaced intertrochanteric fracture of left femur with delayed healing 01/14/2017   Clotting disorder (HCC)    pt denies this   Contusion of left knee    due to fall 1/14.   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     Morbid obesity (HCC)    Neuromuscular disorder (HCC)    diabetic neuropathy    Normal coronary arteries    cardiac catheterization performed  10/31/14   Pneumonia    RA (rheumatoid  arthritis) (HCC)    has been off methotreaxte since 10/13.   Septic shock (HCC) 06/13/2023   Spinal stenosis of lumbar region    Thyroid  disease    Unspecified lack of coordination    URI (upper respiratory infection)    UTI (urinary tract infection) 03/31/2021   11/12 - 04/03/2021 Klebsiella UTI associated with encephalopathy in the context of AKI.  Status post IV ceftriaxone  followed by oral Keflex  x48 hours.     Past Surgical History:  Procedure Laterality Date   BRAIN SURGERY     Gamma knife 10/13. Needs repeat spring  '14   CARDIAC CATHETERIZATION N/A 10/31/2014   Procedure: Right/Left Heart Cath and Coronary Angiography;  Surgeon: Lucendia Rusk, MD;  Location: [facility] INVASIVE CV LAB;  Service: Cardiovascular;  Laterality: N/A;   COLONOSCOPY WITH PROPOFOL  N/A 04/14/2022   Procedure: COLONOSCOPY WITH PROPOFOL ;  Surgeon: Nannette Babe, MD;  Location: [facility] ENDOSCOPY;  Service: Gastroenterology;  Laterality: N/A;   CYST EXCISION     2 on face   CYST REMOVAL NECK     ESOPHAGOGASTRODUODENOSCOPY (EGD) WITH PROPOFOL  N/A 09/14/2014   Procedure: ESOPHAGOGASTRODUODENOSCOPY (EGD) WITH PROPOFOL ;  Surgeon: Claudette Cue, MD;  Location: [facility] ENDOSCOPY;  Service: Endoscopy;  Laterality: N/A;   INCISION AND DRAINAGE HIP Left 01/16/2017   Procedure: IRRIGATION AND DEBRIDEMENT LEFT HIP;  Surgeon: Arnie Lao, MD;  Location: [facility] ORS;  Service: Orthopedics;  Laterality: Left;   INTRAMEDULLARY (IM) NAIL INTERTROCHANTERIC Left 11/29/2016   Procedure: INTRAMEDULLARY (IM) NAIL INTERTROCHANTRIC;  Surgeon: Arnie Lao, MD;  Location: [facility] OR;  Service: Orthopedics;  Laterality: Left;   LAPAROSCOPIC LOOP COLOSTOMY N/A 07/17/2023   Procedure: LAPAROSCOPIC DIVERTING LOOP COLOSTOMY;  Surgeon: Dareen Ebbing, MD;  Location: [facility] OR;  Service: General;  Laterality: N/A;   OVARY SURGERY     RIGHT HEART CATH N/A 08/12/2021   Procedure: RIGHT HEART CATH;  Surgeon: Avanell Leigh, MD;  Location:  [name] INVASIVE CV LAB;  Service: Cardiovascular;  Laterality: N/A;   SHOULDER SURGERY Left    TONSILLECTOMY  age 58   VAGINAL HYSTERECTOMY     VIDEO BRONCHOSCOPY Bilateral 05/31/2013   Procedure: VIDEO BRONCHOSCOPY WITHOUT FLUORO;  Surgeon:  Maire Scot, MD;  Location: [facility] ENDOSCOPY;  Service: Cardiopulmonary;  Laterality: Bilateral;   video bronscoscopy  05/19/1998   lung   Family History  Problem Relation Age of Onset   Diabetes Mother    Heart attack Mother    Hypertension Father    Lung cancer Father    Diabetes Sister    Breast cancer Sister    Breast cancer Sister    Diabetes Brother    Hypertension Brother    Heart disease Brother    Heart attack Brother    Kidney cancer Brother    Gout Brother    Kidney failure Brother        x 5   Esophageal cancer Brother    Stomach cancer Brother    Prostate cancer Brother    Uterine cancer Daughter        with mets   Fibroids Daughter    Hypertension Daughter    Hypertension Daughter    Hypertension Son    Heart murmur Son    Rheum arthritis Maternal Uncle    Alzheimer's disease Neg Hx    Dementia Neg Hx    Social History   Socioeconomic History   Marital status: Married    Spouse name: Not on file   Number of children: 6   Years of education: college   Highest education level: Bachelor's degree (e.g., BA, AB, BS)  Occupational History   Occupation: retired Engineer, civil (consulting)  Tobacco Use   Smoking status: Never   Smokeless tobacco: Never  Vaping Use   Vaping status: Never Used  Substance and Sexual Activity   Alcohol  use: No   Drug use: No   Sexual activity: Not Currently  Other Topics Concern   Not on file  Social History Narrative   Patient consumes 2-3 cups coffee per day.   Social Drivers of Corporate investment banker Strain: Low Risk  (10/30/2023)   Overall Financial Resource Strain (CARDIA)    Difficulty of Paying Living Expenses: Not hard at all  Food Insecurity: No Food Insecurity (10/30/2023)   Hunger  Vital Sign    Worried About Running Out of Food in the Last Year: Never true    Ran Out of Food in the Last Year: Never true  Transportation Needs: No Transportation Needs (10/30/2023)   PRAPARE - Administrator, Civil Service (Medical): No    Lack of Transportation (Non-Medical): No  Physical Activity: Insufficiently Active (10/30/2023)   Exercise Vital Sign    Days of Exercise per Week: 5 days    Minutes of Exercise per Session: 20 min  Stress: No Stress Concern Present (10/30/2023)   [employer] of Occupational Health - Occupational Stress Questionnaire    Feeling of Stress: Not at all  Social Connections: Socially Integrated (10/30/2023)   Social Connection and Isolation Panel    Frequency of Communication with Friends and Family: More than three times a week    Frequency of Social Gatherings with Friends and Family: More than three times a week    Attends Religious Services: More than 4 times per year    Active Member of [employer] or Organizations: Yes    Attends Engineer, structural: More than 4 times per year    Marital Status: Married    Tobacco Counseling Counseling given: Not Answered    Clinical Intake:  Pre-visit preparation completed: Yes  Pain : No/denies pain     BMI - recorded: 35.87 Nutritional Status: BMI > 30  Obese Nutritional Risks: None Diabetes: No  Lab Results  Component Value Date   HGBA1C 6.0 (A) 10/15/2023   HGBA1C 5.6 06/14/2023   HGBA1C 5.5 12/16/2022     How often do you need to have someone help you when you read instructions, pamphlets, or other written materials from your doctor or pharmacy?: 1 - Never  Interpreter Needed?: No  Information entered by :: Farris Hong LPN   Activities of Daily Living     10/30/2023    4:19 PM 08/12/2023    3:00 PM  In your present state of health, do you have any difficulty performing the following activities:  Hearing? 0 0  Vision? 0 0  Difficulty concentrating or making  decisions? 0 1  Walking or climbing stairs? 1   Comment Uses a Therapist, occupational or bathing? 0   Doing errands, shopping? 0 1  Preparing Food and eating ? N   Using the Toilet? N   In the past six months, have you accidently leaked urine? Y   Comment Wears breifs. Followed by medical attention   Do you have problems with loss of bowel control? Y   Comment Wears Ostomy Bag   Managing your Medications? Y   Comment Aide assist   Managing your Finances? N   Housekeeping or managing your Housekeeping? Y   Comment Aide assist     Patient Care Team: Viola Greulich, MD as PCP - General (Family Medicine) Avanell Leigh, MD as PCP - Cardiology (Cardiology) Duffy, Jamison Mccreedy, LCSW as Social Worker (Licensed Clinical Social Worker) [place], [facility] Nicholas Bari, MD as Consulting Physician (Rheumatology) [name] Paras, NP (Rheumatology) Odilia Bennett, MD as Referring Physician (Pulmonary Disease) Carnell Christian, [facility] (Pharmacist)  I have updated your Care Teams any recent Medical Services you may have received from other providers in the past year.     Assessment:   This is a routine wellness examination for Charnice.  Hearing/Vision screen Hearing Screening - Comments:: Denies hearing difficulties   Vision Screening - Comments:: Wears rx glasses - up to date with routine eye exams with  [facility]   Goals Addressed               This Visit's Progress     Increase physical activity (pt-stated)        Be able to stand better.       Depression Screen     10/30/2023    4:19 PM 10/15/2023    2:45 PM 06/12/2023   12:04 PM 05/25/2023   11:22 AM 03/25/2023   11:21 AM 03/02/2023    1:23 PM 01/28/2023   11:23 AM  PHQ 2/9 Scores  PHQ - 2 Score 0 3 3 0 0 0 0  PHQ- 9 Score 0 5 17   0     Fall Risk     10/30/2023    4:22 PM 10/15/2023    2:45 PM 05/25/2023   11:22 AM 03/25/2023   11:21 AM 03/02/2023    1:23 PM  Fall Risk    Falls in the past year? 1 1 0 0 0  Number falls in past yr: 0 0 0 0 0  Injury with Fall? 0 0 0 0 0  Risk for fall due to :     No Fall Risks  Follow up Falls evaluation completed Falls evaluation completed   Falls evaluation completed    MEDICARE RISK AT HOME:  Medicare Risk  at Home Any stairs in or around the home?: No If so, are there any without handrails?: No Home free of loose throw rugs in walkways, pet beds, electrical cords, etc?: Yes Adequate lighting in your home to reduce risk of falls?: Yes Life alert?: No Use of a cane, walker or w/c?: Yes Grab bars in the bathroom?: Yes Shower chair or bench in shower?: Yes Elevated toilet seat or a handicapped toilet?: Yes  TIMED UP AND GO:  Was the test performed?  No  Cognitive Function: 6CIT completed    09/08/2022   10:40 AM 03/06/2022    7:56 AM  MMSE - Mini Mental State Exam  Orientation to time 4 5  Orientation to Place 5 3  Registration 3 3  Attention/ Calculation 1 1  Recall 3 3  Language- name 2 objects 2 2  Language- repeat 1 0  Language- follow 3 step command 3 3  Language- read & follow direction 1 1  Write a sentence 1 1  Copy design 0 0  Total score 24 22        10/30/2023    4:23 PM 10/27/2022    1:52 PM 10/23/2021   12:02 PM  6CIT Screen  What Year? 0 points 0 points 0 points  What month? 0 points 0 points 0 points  What time? 0 points 0 points 0 points  Count back from 20 0 points 0 points 0 points  Months in reverse 0 points 0 points 0 points  Repeat phrase 0 points 0 points 0 points  Total Score 0 points 0 points 0 points    Immunizations Immunization History  Administered Date(s) Administered   Fluad Quad(high Dose 65+) 02/02/2019, 03/11/2021, 04/01/2022   Fluad Trivalent(High Dose 65+) 03/02/2023   Influenza Split 01/29/2012, 03/30/2013, 02/10/2014   Influenza, High Dose Seasonal PF 02/16/2017, 02/11/2018, 01/31/2019, 02/27/2020   Influenza,inj,Quad PF,6+ Mos 02/09/2015, 12/18/2015    Influenza-Unspecified 02/09/2015, 12/18/2015   PFIZER Comirnaty(Gray Top)Covid-19 Tri-Sucrose Vaccine 07/24/2019, 08/14/2019, 02/27/2020   PFIZER(Purple Top)SARS-COV-2 Vaccination 07/17/2019, 08/14/2019   PPD Test 04/23/2015   Pneumococcal Conjugate-13 08/21/2014   Pneumococcal Polysaccharide-23 02/17/2012   Tdap 12/27/2015    Screening Tests Health Maintenance  Topic Date Due   Zoster Vaccines- Shingrix (1 of 2) Never done   COVID-19 Vaccine (6 - 2024-25 season) 01/18/2023   Diabetic kidney evaluation - Urine ACR  11/28/2023   FOOT EXAM  03/01/2024   HEMOGLOBIN A1C  04/16/2024   OPHTHALMOLOGY EXAM  04/28/2024   Diabetic kidney evaluation - eGFR measurement  10/14/2024   Medicare Annual Wellness (AWV)  10/29/2024   DTaP/Tdap/Td (2 - Td or Tdap) 12/26/2025   Pneumococcal Vaccine: 50+ Years  Completed   DEXA SCAN  Completed   HPV VACCINES  Aged Out   Meningococcal B Vaccine  Aged Out   INFLUENZA VACCINE  Discontinued    Health Maintenance  Health Maintenance Due  Topic Date Due   Zoster Vaccines- Shingrix (1 of 2) Never done   COVID-19 Vaccine (6 - 2024-25 season) 01/18/2023   Diabetic kidney evaluation - Urine ACR  11/28/2023   Health Maintenance Items Addressed:   Additional Screening:  Vision Screening: Recommended annual ophthalmology exams for early detection of glaucoma and other disorders of the eye. Would you like a referral to an eye doctor? No    Dental Screening: Recommended annual dental exams for proper oral hygiene  Community Resource Referral / Chronic Care Management: CRR required this visit?  No   CCM required this  visit?  No   Plan:    I have personally reviewed and noted the following in the patient's chart:   Medical and social history Use of alcohol , tobacco or illicit drugs  Current medications and supplements including opioid prescriptions. Patient is not currently taking opioid prescriptions. Functional ability and status Nutritional  status Physical activity Advanced directives List of other physicians Hospitalizations, surgeries, and ER visits in previous 12 months Vitals Screenings to include cognitive, depression, and falls Referrals and appointments  In addition, I have reviewed and discussed with patient certain preventive protocols, quality metrics, and best practice recommendations. A written personalized care plan for preventive services as well as general preventive health recommendations were provided to patient.   Dewayne Ford, LPN   1/61/0960   After Visit Summary: (MyChart) Due to this being a telephonic visit, the after visit summary with patients personalized plan was offered to patient via MyChart   Notes: Nothing significant to report at this time.

## 2023-10-30 NOTE — Patient Instructions (Addendum)
 [name] , Thank you for taking time out of your busy schedule to complete your Annual Wellness Visit with me. I enjoyed our conversation and look forward to speaking with you again next year. I, as well as your care team,  appreciate your ongoing commitment to your health goals. Please review the following plan we discussed and let me know if I can assist you in the future. Your Game plan/ To Do List    Referrals: If you haven't heard from the office you've been referred to, please reach out to them at the phone provided.    Follow up Visits: Next Medicare AWV with our clinical staff: 11/04/24 @ 3:40p   Have you seen your provider in the last 6 months (3 months if uncontrolled diabetes)?  Next Office Visit with your provider: 01/15/24 @ 2:30p  Clinician Recommendations:  Aim for 30 minutes of exercise or brisk walking, 6-8 glasses of water, and 5 servings of fruits and vegetables each day.       This is a list of the screening recommended for you and due dates:  Health Maintenance  Topic Date Due   Zoster (Shingles) Vaccine (1 of 2) Never done   COVID-19 Vaccine (6 - 2024-25 season) 01/18/2023   Yearly kidney health urinalysis for diabetes  11/28/2023   Complete foot exam   03/01/2024   Hemoglobin A1C  04/16/2024   Eye exam for diabetics  04/28/2024   Yearly kidney function blood test for diabetes  10/14/2024   Medicare Annual Wellness Visit  10/29/2024   DTaP/Tdap/Td vaccine (2 - Td or Tdap) 12/26/2025   Pneumococcal Vaccine for age over 26  Completed   DEXA scan (bone density measurement)  Completed   HPV Vaccine  Aged Out   Meningitis B Vaccine  Aged Out   Flu Shot  Discontinued    Advanced directives: (In Chart) A copy of your advanced directives are scanned into your chart should your provider ever need it. Advance Care Planning is important because it:  [x]  Makes sure you receive the medical care that is consistent with your values, goals, and preferences  [x]  It  provides guidance to your family and loved ones and reduces their decisional burden about whether or not they are making the right decisions based on your wishes.  Follow the link provided in your after visit summary or read over the paperwork we have mailed to you to help you started getting your Advance Directives in place. If you need assistance in completing these, please reach out to us  so that we can help you!  See attachments for Preventive Care and Fall Prevention Tips.

## 2023-11-02 DIAGNOSIS — K56699 Other intestinal obstruction unspecified as to partial versus complete obstruction: Secondary | ICD-10-CM | POA: Diagnosis not present

## 2023-11-02 DIAGNOSIS — Z48815 Encounter for surgical aftercare following surgery on the digestive system: Secondary | ICD-10-CM | POA: Diagnosis not present

## 2023-11-02 DIAGNOSIS — E1122 Type 2 diabetes mellitus with diabetic chronic kidney disease: Secondary | ICD-10-CM | POA: Diagnosis not present

## 2023-11-02 DIAGNOSIS — I5032 Chronic diastolic (congestive) heart failure: Secondary | ICD-10-CM | POA: Diagnosis not present

## 2023-11-02 DIAGNOSIS — Z433 Encounter for attention to colostomy: Secondary | ICD-10-CM | POA: Diagnosis not present

## 2023-11-02 DIAGNOSIS — I130 Hypertensive heart and chronic kidney disease with heart failure and stage 1 through stage 4 chronic kidney disease, or unspecified chronic kidney disease: Secondary | ICD-10-CM | POA: Diagnosis not present

## 2023-11-04 ENCOUNTER — Encounter: Payer: Self-pay | Admitting: Gastroenterology

## 2023-11-04 ENCOUNTER — Ambulatory Visit (INDEPENDENT_AMBULATORY_CARE_PROVIDER_SITE_OTHER): Admitting: Gastroenterology

## 2023-11-04 ENCOUNTER — Other Ambulatory Visit (INDEPENDENT_AMBULATORY_CARE_PROVIDER_SITE_OTHER)

## 2023-11-04 VITALS — BP 120/82 | HR 88 | Ht 64.0 in | Wt 199.0 lb

## 2023-11-04 DIAGNOSIS — D649 Anemia, unspecified: Secondary | ICD-10-CM

## 2023-11-04 DIAGNOSIS — R1032 Left lower quadrant pain: Secondary | ICD-10-CM | POA: Diagnosis not present

## 2023-11-04 DIAGNOSIS — E039 Hypothyroidism, unspecified: Secondary | ICD-10-CM | POA: Diagnosis not present

## 2023-11-04 DIAGNOSIS — I5032 Chronic diastolic (congestive) heart failure: Secondary | ICD-10-CM | POA: Diagnosis not present

## 2023-11-04 DIAGNOSIS — L89312 Pressure ulcer of right buttock, stage 2: Secondary | ICD-10-CM | POA: Diagnosis not present

## 2023-11-04 DIAGNOSIS — I471 Supraventricular tachycardia, unspecified: Secondary | ICD-10-CM | POA: Diagnosis not present

## 2023-11-04 DIAGNOSIS — Z48815 Encounter for surgical aftercare following surgery on the digestive system: Secondary | ICD-10-CM | POA: Diagnosis not present

## 2023-11-04 DIAGNOSIS — I130 Hypertensive heart and chronic kidney disease with heart failure and stage 1 through stage 4 chronic kidney disease, or unspecified chronic kidney disease: Secondary | ICD-10-CM | POA: Diagnosis not present

## 2023-11-04 DIAGNOSIS — E1142 Type 2 diabetes mellitus with diabetic polyneuropathy: Secondary | ICD-10-CM | POA: Diagnosis not present

## 2023-11-04 DIAGNOSIS — J9610 Chronic respiratory failure, unspecified whether with hypoxia or hypercapnia: Secondary | ICD-10-CM | POA: Diagnosis not present

## 2023-11-04 DIAGNOSIS — J8410 Pulmonary fibrosis, unspecified: Secondary | ICD-10-CM | POA: Diagnosis not present

## 2023-11-04 DIAGNOSIS — A419 Sepsis, unspecified organism: Secondary | ICD-10-CM | POA: Diagnosis not present

## 2023-11-04 DIAGNOSIS — I25119 Atherosclerotic heart disease of native coronary artery with unspecified angina pectoris: Secondary | ICD-10-CM | POA: Diagnosis not present

## 2023-11-04 DIAGNOSIS — F320 Major depressive disorder, single episode, mild: Secondary | ICD-10-CM | POA: Diagnosis not present

## 2023-11-04 DIAGNOSIS — M1990 Unspecified osteoarthritis, unspecified site: Secondary | ICD-10-CM | POA: Diagnosis not present

## 2023-11-04 DIAGNOSIS — M1030 Gout due to renal impairment, unspecified site: Secondary | ICD-10-CM | POA: Diagnosis not present

## 2023-11-04 DIAGNOSIS — M069 Rheumatoid arthritis, unspecified: Secondary | ICD-10-CM | POA: Diagnosis not present

## 2023-11-04 DIAGNOSIS — G894 Chronic pain syndrome: Secondary | ICD-10-CM | POA: Diagnosis not present

## 2023-11-04 DIAGNOSIS — D631 Anemia in chronic kidney disease: Secondary | ICD-10-CM | POA: Diagnosis not present

## 2023-11-04 DIAGNOSIS — J189 Pneumonia, unspecified organism: Secondary | ICD-10-CM | POA: Diagnosis not present

## 2023-11-04 DIAGNOSIS — J849 Interstitial pulmonary disease, unspecified: Secondary | ICD-10-CM | POA: Diagnosis not present

## 2023-11-04 DIAGNOSIS — Z433 Encounter for attention to colostomy: Secondary | ICD-10-CM | POA: Diagnosis not present

## 2023-11-04 DIAGNOSIS — L89322 Pressure ulcer of left buttock, stage 2: Secondary | ICD-10-CM | POA: Diagnosis not present

## 2023-11-04 DIAGNOSIS — K56699 Other intestinal obstruction unspecified as to partial versus complete obstruction: Secondary | ICD-10-CM | POA: Diagnosis not present

## 2023-11-04 DIAGNOSIS — E1122 Type 2 diabetes mellitus with diabetic chronic kidney disease: Secondary | ICD-10-CM | POA: Diagnosis not present

## 2023-11-04 DIAGNOSIS — J440 Chronic obstructive pulmonary disease with acute lower respiratory infection: Secondary | ICD-10-CM | POA: Diagnosis not present

## 2023-11-04 DIAGNOSIS — N183 Chronic kidney disease, stage 3 unspecified: Secondary | ICD-10-CM | POA: Diagnosis not present

## 2023-11-04 LAB — IBC + FERRITIN
Ferritin: 75.6 ng/mL (ref 10.0–291.0)
Iron: 48 ug/dL (ref 42–145)
Saturation Ratios: 18.8 % — ABNORMAL LOW (ref 20.0–50.0)
TIBC: 254.8 ug/dL (ref 250.0–450.0)
Transferrin: 182 mg/dL — ABNORMAL LOW (ref 212.0–360.0)

## 2023-11-04 LAB — B12 AND FOLATE PANEL
Folate: 16.1 ng/mL (ref >5.9)
Vitamin B-12: 554 pg/mL (ref 211–911)

## 2023-11-04 MED ORDER — HYOSCYAMINE SULFATE 0.125 MG SL SUBL
0.1250 mg | SUBLINGUAL_TABLET | Freq: Three times a day (TID) | SUBLINGUAL | 1 refills | Status: AC | PRN
Start: 2023-11-04 — End: ?

## 2023-11-04 NOTE — Progress Notes (Signed)
 11/04/2023 [name] [id number] 1940/11/10   HISTORY OF PRESENT ILLNESS: ***         Past Medical History:  Diagnosis Date   Acute encephalopathy 03/30/2021   11/12 - 04/03/2021 acute encephalopathy in the context of altered mental status and poor oral intake following increasing pain medicines and associated Klebsiella UTI.     Acute kidney injury superimposed on chronic kidney disease (HCC) 04/17/2015   11/12 - 04/03/2021 AKI with creatinine of 2.22 and GFR 22 indicating stage IV renal disease in the context of encephalopathy secondary to increased pain medications with decreased oral intake of fluids and nutrition.     Acute on chronic diastolic (congestive) heart failure (HCC)    Anemia    iron  deficiency anemia - secondary to blood loss ( chronic)    Anxiety    Arthritis    endstage changes bilateral knees/bilateral ankles.    Asthma    Carotid artery occlusion    Chest pain of uncertain etiology 01/28/2012   Normal coronaries at cath     Chronic fatigue    Chronic kidney disease    Closed intertrochanteric fracture of hip, left, initial encounter (HCC) 11/29/2016   Closed left hip fracture (HCC)    Closed nondisplaced intertrochanteric fracture of left femur with delayed healing 01/14/2017   Clotting disorder (HCC)    pt denies this   Contusion of left knee    due to fall 1/14.   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     Morbid obesity (HCC)     Neuromuscular disorder (HCC)    diabetic neuropathy    Normal coronary arteries    cardiac catheterization performed  10/31/14   Pneumonia    RA (rheumatoid arthritis) (HCC)    has been off methotreaxte since 10/13.   Septic shock (HCC) 06/13/2023   Spinal stenosis of lumbar region    Thyroid  disease    Unspecified lack of coordination    URI (upper respiratory infection)    UTI (urinary tract infection) 03/31/2021   11/12 - 04/03/2021 Klebsiella UTI associated with encephalopathy in the context of AKI.  Status post IV ceftriaxone  followed by oral Keflex  x48 hours.     Past Surgical History:  Procedure Laterality Date   BRAIN SURGERY     Gamma knife 10/13. Needs repeat spring  '14   CARDIAC CATHETERIZATION N/A 10/31/2014   Procedure: Right/Left Heart Cath and Coronary Angiography;  Surgeon: Lucendia Rusk, MD;  Location: [facility] INVASIVE CV LAB;  Service: Cardiovascular;  Laterality: N/A;   COLONOSCOPY WITH PROPOFOL  N/A 04/14/2022   Procedure: COLONOSCOPY WITH PROPOFOL ;  Surgeon: Nannette Babe, MD;  Location: [facility] ENDOSCOPY;  Service: Gastroenterology;  Laterality: N/A;   CYST EXCISION     2 on face   CYST REMOVAL NECK     ESOPHAGOGASTRODUODENOSCOPY (EGD) WITH PROPOFOL  N/A 09/14/2014  Procedure: ESOPHAGOGASTRODUODENOSCOPY (EGD) WITH PROPOFOL ;  Surgeon: Claudette Cue, MD;  Location: [facility] ENDOSCOPY;  Service: Endoscopy;  Laterality: N/A;   INCISION AND DRAINAGE HIP Left 01/16/2017   Procedure: IRRIGATION AND DEBRIDEMENT LEFT HIP;  Surgeon: Arnie Lao, MD;  Location: [facility] ORS;  Service: Orthopedics;  Laterality: Left;   INTRAMEDULLARY (IM) NAIL INTERTROCHANTERIC Left 11/29/2016   Procedure: INTRAMEDULLARY (IM) NAIL INTERTROCHANTRIC;  Surgeon: Arnie Lao, MD;  Location: [facility] OR;  Service: Orthopedics;  Laterality: Left;   LAPAROSCOPIC LOOP COLOSTOMY N/A 07/17/2023   Procedure: LAPAROSCOPIC DIVERTING LOOP COLOSTOMY;  Surgeon: Dareen Ebbing, MD;  Location: [facility] OR;   Service: General;  Laterality: N/A;   OVARY SURGERY     RIGHT HEART CATH N/A 08/12/2021   Procedure: RIGHT HEART CATH;  Surgeon: Avanell Leigh, MD;  Location: [facility] INVASIVE CV LAB;  Service: Cardiovascular;  Laterality: N/A;   SHOULDER SURGERY Left    TONSILLECTOMY  age 83   VAGINAL HYSTERECTOMY     VIDEO BRONCHOSCOPY Bilateral 05/31/2013   Procedure: VIDEO BRONCHOSCOPY WITHOUT FLUORO;  Surgeon: Maire Scot, MD;  Location: [facility] ENDOSCOPY;  Service: Cardiopulmonary;  Laterality: Bilateral;   video bronscoscopy  05/19/1998   lung    reports that she has never smoked. She has never used smokeless tobacco. She reports that she does not drink alcohol  and does not use drugs. family history includes Breast cancer in her sister and sister; Diabetes in her brother, mother, and sister; Esophageal cancer in her brother; Fibroids in her daughter; Gout in her brother; Heart attack in her brother and mother; Heart disease in her brother; Heart murmur in her son; Hypertension in her brother, daughter, daughter, father, and son; Kidney cancer in her brother; Kidney failure in her brother; Lung cancer in her father; Prostate cancer in her brother; Rheum arthritis in her maternal uncle; Stomach cancer in her brother; Uterine cancer in her daughter. Allergies  Allergen Reactions   Codeine Swelling and Other (See Comments)    Facial and leg swelling Chest pain    Remicade [Infliximab] Anaphylaxis    sent me into shock    Zestril [Lisinopril] Swelling and Rash    Face and neck swelling    Ofev  [Nintedanib] Diarrhea      Outpatient Encounter Medications as of 11/04/2023  Medication Sig   acetaminophen  (TYLENOL ) 500 MG tablet Take 1,000 mg by mouth every 8 (eight) hours as needed (pain).   albuterol  (VENTOLIN  HFA) 108 (90 Base) MCG/ACT inhaler Inhale 2 puffs into the lungs in the morning and at bedtime.   atorvastatin  (LIPITOR) 10 MG tablet Take 1 tablet (10 mg total) by mouth daily.   azithromycin   (ZITHROMAX ) 250 MG tablet One tab m-wed -friday   bisacodyl  (DULCOLAX) 10 MG suppository Place 10 mg rectally daily as needed (bowel management).   Cholecalciferol  (VITAMIN D3) 50 MCG (2000 UT) TABS Take 2,000 Units by mouth daily.   diclofenac  Sodium (VOLTAREN ) 1 % GEL Apply 2 g topically 3 (three) times daily.   DULoxetine  (CYMBALTA ) 60 MG capsule Take 1 capsule (60 mg total) by mouth daily.   furosemide  (LASIX ) 40 MG tablet Take 1 tablet (40 mg total) by mouth daily.   gabapentin  (NEURONTIN ) 300 MG capsule Take 1 capsule (300 mg total) by mouth 2 (two) times daily.   guaiFENesin  (MUCINEX ) 600 MG 12 hr tablet Take 2 tablets (1,200 mg total) by mouth 2 (two) times daily.   hydroxychloroquine  (PLAQUENIL ) 200 MG tablet Take 1 tablet (200 mg total) by mouth daily.  leflunomide  (ARAVA ) 20 MG tablet Take 1 tablet (20 mg total) by mouth daily.   leptospermum manuka honey (MEDIHONEY) PSTE paste Apply 1 Application topically daily. Interchangeable with TheraHoney Apply thin layer (3 mm) to wound.   levothyroxine  (SYNTHROID ) 50 MCG tablet Take 1 tablet (50 mcg total) by mouth daily.   lidocaine  (LIDODERM ) 5 % Place 1 patch onto the skin daily. Remove & Discard patch within 12 hours or as directed by MD   magnesium  hydroxide (MILK OF MAGNESIA) 400 MG/5ML suspension Take 30 mLs by mouth daily as needed (no BM in 3 days).   Magnesium  Oxide 250 MG TABS Take 1 tablet (250 mg total) by mouth daily.   methocarbamol  (ROBAXIN ) 500 MG tablet Take 1 tablet (500 mg total) by mouth every 6 (six) hours as needed for muscle spasms (pain).   METOPROLOL  TARTRATE PO Take 62.5 mg by mouth every 12 (twelve) hours.   morphine  (MS CONTIN ) 15 MG 12 hr tablet Take 1 tablet (15 mg total) by mouth every 12 (twelve) hours.   Multiple Vitamin (MULTIVITAMIN) tablet Take 1 tablet by mouth daily.   OXYGEN  Inhale 3 L/min into the lungs at bedtime.   pantoprazole  (PROTONIX ) 40 MG tablet Take 1 tablet (40 mg total) by mouth 2 (two)  times daily.   polyethylene glycol powder (GLYCOLAX /MIRALAX ) 17 GM/SCOOP powder Take 17 g by mouth daily as needed (bowel management).   predniSONE  (DELTASONE ) 10 MG tablet Take 1 tablet (10 mg total) by mouth daily with breakfast.   senna (SENOKOT) 8.6 MG TABS tablet Take 17.2 mg by mouth daily as needed (bowel management).   ZINC  OXIDE EX Apply 1 application  topically See admin instructions. Apply topically to sacral area twice daily with morning/night shift and as needed with each incontinent episode.   nitroGLYCERIN  (NITROSTAT ) 0.4 MG SL tablet Place 1 tablet (0.4 mg total) under the tongue every 5 (five) minutes as needed for chest pain.   potassium chloride  SA (KLOR-CON  M) 20 MEQ tablet Take 2 tablets (40 mEq total) by mouth 2 (two) times daily.   Sodium Phosphates  (ENEMA) ENEM Place 1 enema rectally daily as needed (constipation not relieved by bisacodyl  suppository).   No facility-administered encounter medications on file as of 11/04/2023.     REVIEW OF SYSTEMS  : All other systems reviewed and negative except where noted in the History of Present Illness.   PHYSICAL EXAM: BP 120/82   Pulse 88   Ht 5' 4 (1.626 m)   Wt 199 lb (90.3 kg)   BMI 34.16 kg/m  General: Well developed white female in no acute distress Head: Normocephalic and atraumatic Eyes:  sclerae anicteric,conjunctive pink. Ears: Normal auditory acuity Neck: Supple, no masses.  Lungs: Clear throughout to auscultation Heart: Regular rate and rhythm Abdomen: Soft, nontender, non distended. No masses or hepatomegaly noted. Normal bowel sounds Rectal: *** Musculoskeletal: Symmetrical with no gross deformities  Skin: No lesions on visible extremities Extremities: No edema  Neurological: Alert oriented x 4, grossly nonfocal Cervical Nodes:  No significant cervical adenopathy Psychological:  Alert and cooperative. Normal mood and affect  ASSESSMENT AND PLAN:    CC:  Viola Greulich, MD

## 2023-11-04 NOTE — Patient Instructions (Signed)
 Your provider has requested that you go to the basement level for lab work before leaving today. Press B on the elevator. The lab is located at the first door on the left as you exit the elevator.  We have sent the following medications to your pharmacy for you to pick up at your convenience: Levsin 0.125 SL every 8 hours as needed for abdominal pain.   _______________________________________________________  If your blood pressure at your visit was 140/90 or greater, please contact your primary care physician to follow up on this.  _______________________________________________________  If you are age 55 or older, your body mass index should be between 23-30. Your Body mass index is 34.16 kg/m. If this is out of the aforementioned range listed, please consider follow up with your Primary Care Provider.  If you are age 82 or younger, your body mass index should be between 19-25. Your Body mass index is 34.16 kg/m. If this is out of the aformentioned range listed, please consider follow up with your Primary Care Provider.   ________________________________________________________  The [facility] providers would like to encourage you to use MYCHART to communicate with providers for non-urgent requests or questions.  Due to long hold times on the telephone, sending your provider a message by [facility] may be a faster and more efficient way to get a response.  Please allow 48 business hours for a response.  Please remember that this is for non-urgent requests.  _______________________________________________________

## 2023-11-05 ENCOUNTER — Ambulatory Visit: Payer: Self-pay | Admitting: Gastroenterology

## 2023-11-05 DIAGNOSIS — I5032 Chronic diastolic (congestive) heart failure: Secondary | ICD-10-CM | POA: Diagnosis not present

## 2023-11-05 DIAGNOSIS — Z433 Encounter for attention to colostomy: Secondary | ICD-10-CM | POA: Diagnosis not present

## 2023-11-05 DIAGNOSIS — K56699 Other intestinal obstruction unspecified as to partial versus complete obstruction: Secondary | ICD-10-CM | POA: Diagnosis not present

## 2023-11-05 DIAGNOSIS — E1122 Type 2 diabetes mellitus with diabetic chronic kidney disease: Secondary | ICD-10-CM | POA: Diagnosis not present

## 2023-11-05 DIAGNOSIS — Z48815 Encounter for surgical aftercare following surgery on the digestive system: Secondary | ICD-10-CM | POA: Diagnosis not present

## 2023-11-05 DIAGNOSIS — I130 Hypertensive heart and chronic kidney disease with heart failure and stage 1 through stage 4 chronic kidney disease, or unspecified chronic kidney disease: Secondary | ICD-10-CM | POA: Diagnosis not present

## 2023-11-06 ENCOUNTER — Encounter: Payer: Self-pay | Admitting: Gastroenterology

## 2023-11-06 DIAGNOSIS — R1032 Left lower quadrant pain: Secondary | ICD-10-CM | POA: Insufficient documentation

## 2023-11-06 DIAGNOSIS — D649 Anemia, unspecified: Secondary | ICD-10-CM | POA: Insufficient documentation

## 2023-11-09 ENCOUNTER — Encounter: Payer: Self-pay | Admitting: Registered Nurse

## 2023-11-09 ENCOUNTER — Encounter: Attending: Physical Medicine and Rehabilitation | Admitting: Registered Nurse

## 2023-11-09 VITALS — BP 138/85 | HR 93 | Ht 64.0 in

## 2023-11-09 DIAGNOSIS — M25511 Pain in right shoulder: Secondary | ICD-10-CM | POA: Insufficient documentation

## 2023-11-09 DIAGNOSIS — Z5181 Encounter for therapeutic drug level monitoring: Secondary | ICD-10-CM | POA: Diagnosis not present

## 2023-11-09 DIAGNOSIS — M25512 Pain in left shoulder: Secondary | ICD-10-CM | POA: Insufficient documentation

## 2023-11-09 DIAGNOSIS — G8929 Other chronic pain: Secondary | ICD-10-CM | POA: Insufficient documentation

## 2023-11-09 DIAGNOSIS — M170 Bilateral primary osteoarthritis of knee: Secondary | ICD-10-CM | POA: Insufficient documentation

## 2023-11-09 DIAGNOSIS — G894 Chronic pain syndrome: Secondary | ICD-10-CM | POA: Diagnosis not present

## 2023-11-09 DIAGNOSIS — M5416 Radiculopathy, lumbar region: Secondary | ICD-10-CM | POA: Diagnosis not present

## 2023-11-09 DIAGNOSIS — M7061 Trochanteric bursitis, right hip: Secondary | ICD-10-CM | POA: Diagnosis not present

## 2023-11-09 DIAGNOSIS — Z79891 Long term (current) use of opiate analgesic: Secondary | ICD-10-CM | POA: Diagnosis not present

## 2023-11-09 MED ORDER — LIDOCAINE 5 % EX PTCH: 1.0000 | MEDICATED_PATCH | CUTANEOUS | 1 refills | Status: AC

## 2023-11-09 NOTE — Progress Notes (Signed)
 Subjective:    Patient ID: [name], female    DOB: [birth year], 83 y.o.   MRN: [id number]  HPI: [name] is a 83 y.o. female who returns for follow up appointment for chronic pain and medication refill. She states her pain is located in her bilateral shoulders, lower back pain radiating into her righ hip and right lower extremity. She also reports bilateral knee pain R>L. She rates her pain 6. Her current exercise regime is receiving physical therapy, walking with walker .  [name] was admitted to hospital several times since last visit, discharge summary reviewed, will request discharge summary from [facility].   [name] Morphine  equivalent is 60.00 MME.        Pain Inventory Average Pain 7 Pain Right Now 6 My pain is intermittent, burning, dull, and tingling  In the last 24 hours, has pain interfered with the following? General activity 8 Relation with others 8 Enjoyment of life 8 What TIME of day is your pain at its worst? morning  Sleep (in general) Poor  Pain is worse with: walking, bending, sitting, standing, and some activites Pain improves with: rest, heat/ice, and medication Relief from Meds: 6  Family History  Problem Relation Age of Onset  . Diabetes Mother   . Heart attack Mother   . Hypertension Father   . Lung cancer Father   . Diabetes Sister   . Breast cancer Sister   . Breast cancer Sister   . Diabetes Brother   . Hypertension Brother   . Heart disease Brother   . Heart attack Brother   . Kidney cancer Brother   . Gout Brother   . Kidney failure Brother        x 5  . Esophageal cancer Brother   . Stomach cancer Brother   . Prostate cancer Brother   . Uterine cancer Daughter        with mets  . Fibroids Daughter   . Hypertension Daughter   . Hypertension Daughter   . Hypertension Son   . Heart murmur Son   . Rheum arthritis Maternal Uncle   . Alzheimer's disease Neg Hx   . Dementia Neg Hx    Social History    Socioeconomic History  . Marital status: Married    Spouse name: Not on file  . Number of children: 6  . Years of education: college  . Highest education level: Bachelor's degree (e.g., BA, AB, BS)  Occupational History  . Occupation: retired Engineer, civil (consulting)  Tobacco Use  . Smoking status: Never  . Smokeless tobacco: Never  Vaping Use  . Vaping status: Never Used  Substance and Sexual Activity  . Alcohol  use: No  . Drug use: No  . Sexual activity: Not Currently  Other Topics Concern  . Not on file  Social History Narrative   Patient consumes 2-3 cups coffee per day.   Social Drivers of Health   Financial Resource Strain: Low Risk  (10/30/2023)   Overall Financial Resource Strain (CARDIA)   . Difficulty of Paying Living Expenses: Not hard at all  Food Insecurity: No Food Insecurity (10/30/2023)   Hunger Vital Sign   . Worried About Programme researcher, broadcasting/film/video in the Last Year: Never true   . Ran Out of Food in the Last Year: Never true  Transportation Needs: No Transportation Needs (10/30/2023)   PRAPARE - Transportation   . Lack of Transportation (Medical): No   . Lack of Transportation (Non-Medical): No  Physical Activity: Insufficiently Active (10/30/2023)   Exercise Vital Sign   . Days of Exercise per Week: 5 days   . Minutes of Exercise per Session: 20 min  Stress: No Stress Concern Present (10/30/2023)   [employer] of Occupational Health - Occupational Stress Questionnaire   . Feeling of Stress: Not at all  Social Connections: Socially Integrated (10/30/2023)   Social Connection and Isolation Panel   . Frequency of Communication with Friends and Family: More than three times a week   . Frequency of Social Gatherings with Friends and Family: More than three times a week   . Attends Religious Services: More than 4 times per year   . Active Member of Clubs or Organizations: Yes   . Attends Banker Meetings: More than 4 times per year   . Marital Status: Married    Past Surgical History:  Procedure Laterality Date  . BRAIN SURGERY     Gamma knife 10/13. Needs repeat spring  '14  . CARDIAC CATHETERIZATION N/A 10/31/2014   Procedure: Right/Left Heart Cath and Coronary Angiography;  Surgeon: Candyce GORMAN Reek, MD;  Location: [facility] INVASIVE CV LAB;  Service: Cardiovascular;  Laterality: N/A;  . COLONOSCOPY WITH PROPOFOL  N/A 04/14/2022   Procedure: COLONOSCOPY WITH PROPOFOL ;  Surgeon: Albertus Gordy HERO, MD;  Location: [facility] ENDOSCOPY;  Service: Gastroenterology;  Laterality: N/A;  . CYST EXCISION     2 on face  . CYST REMOVAL NECK    . ESOPHAGOGASTRODUODENOSCOPY (EGD) WITH PROPOFOL  N/A 09/14/2014   Procedure: ESOPHAGOGASTRODUODENOSCOPY (EGD) WITH PROPOFOL ;  Surgeon: Lamar JONETTA Aho, MD;  Location: [facility] ENDOSCOPY;  Service: Endoscopy;  Laterality: N/A;  . INCISION AND DRAINAGE HIP Left 01/16/2017   Procedure: IRRIGATION AND DEBRIDEMENT LEFT HIP;  Surgeon: Vernetta Lonni GRADE, MD;  Location: [facility] ORS;  Service: Orthopedics;  Laterality: Left;  . INTRAMEDULLARY (IM) NAIL INTERTROCHANTERIC Left 11/29/2016   Procedure: INTRAMEDULLARY (IM) NAIL INTERTROCHANTRIC;  Surgeon: Vernetta Lonni GRADE, MD;  Location: [facility] OR;  Service: Orthopedics;  Laterality: Left;  . LAPAROSCOPIC LOOP COLOSTOMY N/A 07/17/2023   Procedure: LAPAROSCOPIC DIVERTING LOOP COLOSTOMY;  Surgeon: Belinda Cough, MD;  Location: [facility] OR;  Service: General;  Laterality: N/A;  . OVARY SURGERY    . RIGHT HEART CATH N/A 08/12/2021   Procedure: RIGHT HEART CATH;  Surgeon: Court Dorn PARAS, MD;  Location: [facility] INVASIVE CV LAB;  Service: Cardiovascular;  Laterality: N/A;  . SHOULDER SURGERY Left   . TONSILLECTOMY  age 30  . VAGINAL HYSTERECTOMY    . VIDEO BRONCHOSCOPY Bilateral 05/31/2013   Procedure: VIDEO BRONCHOSCOPY WITHOUT FLUORO;  Surgeon: Dorethia Cave, MD;  Location: [facility] ENDOSCOPY;  Service: Cardiopulmonary;  Laterality: Bilateral;  . video bronscoscopy  05/19/1998   lung   Past Surgical History:   Procedure Laterality Date  . BRAIN SURGERY     Gamma knife 10/13. Needs repeat spring  '14  . CARDIAC CATHETERIZATION N/A 10/31/2014   Procedure: Right/Left Heart Cath and Coronary Angiography;  Surgeon: Candyce GORMAN Reek, MD;  Location: [facility] INVASIVE CV LAB;  Service: Cardiovascular;  Laterality: N/A;  . COLONOSCOPY WITH PROPOFOL  N/A 04/14/2022   Procedure: COLONOSCOPY WITH PROPOFOL ;  Surgeon: Albertus Gordy HERO, MD;  [facility]OS[facility];  Service: Gastroenterology;  Laterality: N/A;  . CYST EXCISION     2 on face  . CYST REMOVAL NECK    . ESOPHAGOGASTRODUODENOSCOPY (EGD) WITH PROPOFOL  N/A 09/14/2014   Procedure: ESOPHAGOGASTRODUODENOSCOPY (EGD) WITH PROPOFOL ;  Surgeon: Lamar JONETTA Aho, MD;  Location: [facility]OS[facility];  Service: Endoscopy;  Laterality: N/A;  . INCISION AND DRAINAGE HIP Left 01/16/2017   Procedure: IRRIGATION AND DEBRIDEMENT LEFT HIP;  Surgeon: Vernetta Lonni GRADE, MD;  Location: [facility] ORS;  Service: Orthopedics;  Laterality: Left;  . INTRAMEDULLARY (IM) NAIL INTERTROCHANTERIC Left 11/29/2016   Procedure: INTRAMEDULLARY (IM) NAIL INTERTROCHANTRIC;  Surgeon: Vernetta Lonni GRADE, MD;  Location: [facility] OR;  Service: Orthopedics;  Laterality: Left;  . LAPAROSCOPIC LOOP COLOSTOMY N/A 07/17/2023   Procedure: LAPAROSCOPIC DIVERTING LOOP COLOSTOMY;  Surgeon: Belinda Cough, MD;  Location: [facility] OR;  Service: General;  Laterality: N/A;  . OVARY SURGERY    . RIGHT HEART CATH N/A 08/12/2021   Procedure: RIGHT HEART CATH;  Surgeon: Court Dorn PARAS, MD;  Location: [facility] INVASIVE CV LAB;  Service: Cardiovascular;  Laterality: N/A;  . SHOULDER SURGERY Left   . TONSILLECTOMY  age 67  . VAGINAL HYSTERECTOMY    . VIDEO BRONCHOSCOPY Bilateral 05/31/2013   Procedure: VIDEO BRONCHOSCOPY WITHOUT FLUORO;  Surgeon: Dorethia Cave, MD;  Location: [facility] ENDOSCOPY;  Service: Cardiopulmonary;  Laterality: Bilateral;  . video bronscoscopy  05/19/1998   lung   Past Medical History:  Diagnosis Date  . Acute  encephalopathy 03/30/2021   11/12 - 04/03/2021 acute encephalopathy in the context of altered mental status and poor oral intake following increasing pain medicines and associated Klebsiella UTI.    SABRA Acute kidney injury superimposed on chronic kidney disease (HCC) 04/17/2015   11/12 - 04/03/2021 AKI with creatinine of 2.22 and GFR 22 indicating stage IV renal disease in the context of encephalopathy secondary to increased pain medications with decreased oral intake of fluids and nutrition.    . Acute on chronic diastolic (congestive) heart failure (HCC)   . Anemia    iron  deficiency anemia - secondary to blood loss ( chronic)   . Anxiety   . Arthritis    endstage changes bilateral knees/bilateral ankles.   . Asthma   . Carotid artery occlusion   . Chest pain of uncertain etiology 01/28/2012   Normal coronaries at cath    . Chronic fatigue   . Chronic kidney disease   . Closed intertrochanteric fracture of hip, left, initial encounter (HCC) 11/29/2016  . Closed left hip fracture (HCC)   . Closed nondisplaced intertrochanteric fracture of left femur with delayed healing 01/14/2017  . Clotting disorder (HCC)    pt denies this  . Contusion of left knee    due to fall 1/14.  SABRA COPD (chronic obstructive pulmonary disease) (HCC)    pulmonary fibrosis  . Coronary artery disease   . Depression, reactive   . Diabetes mellitus    type II   . Diastolic dysfunction   . Difficulty in walking   . Family history of heart disease   . Flu 06/26/2023  . Generalized muscle weakness   . Gout   . High cholesterol   . History of falling   . Hypertension   . Hypothyroidism   . Interstitial lung disease (HCC)   . 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    . Morbid obesity (HCC)   . Neuromuscular disorder (HCC)    diabetic neuropathy   . Normal coronary arteries    cardiac catheterization performed   10/31/14  . Pneumonia   . RA (rheumatoid arthritis) (HCC)    has been off methotreaxte since 10/13.  . Septic shock (HCC) 06/13/2023  . Spinal stenosis of lumbar region   . Thyroid   disease   . Unspecified lack of coordination   . URI (upper respiratory infection)   . UTI (urinary tract infection) 03/31/2021   11/12 - 04/03/2021 Klebsiella UTI associated with encephalopathy in the context of AKI.  Status post IV ceftriaxone  followed by oral Keflex  x48 hours.     BP 138/85   Pulse 93   Ht 5' 4 (1.626 m)   SpO2 96% Comment: 3 liters of O2  BMI 34.16 kg/m   Opioid Risk Score:   Fall Risk Score:  `1  Depression screen [facility] 2/9     11/09/2023    1:56 PM 10/30/2023    4:19 PM 10/15/2023    2:45 PM 06/12/2023   12:05 PM 06/12/2023   12:04 PM 05/25/2023   11:22 AM 03/25/2023   11:21 AM  Depression screen PHQ 2/9  Decreased Interest 1 0 2  2 0 0  Down, Depressed, Hopeless 1 0 1  1 0 0  PHQ - 2 Score 2 0 3  3 0 0  Altered sleeping  0 1  3    Tired, decreased energy     3    Change in appetite  0 1  3    Feeling bad or failure about yourself   0 0  1    Trouble concentrating  0 0  1    Moving slowly or fidgety/restless  0 0  3    Suicidal thoughts  0 0  0    PHQ-9 Score  0 5  17    Difficult doing work/chores  Not difficult at all Somewhat difficult Somewhat difficult       Review of Systems  Musculoskeletal:  Positive for gait problem.       Pain in both shoulders, right knee, right arm,right hand, right let  All other systems reviewed and are negative.       Objective:   Physical Exam Vitals and nursing note reviewed.  Constitutional:      Appearance: Normal appearance.  Cardiovascular:     Rate and Rhythm: Normal rate and regular rhythm.     Pulses: Normal pulses.     Heart sounds: Normal heart sounds.  Pulmonary:     Comments: Continuous Oxygen  3 liters nasal cannula Abdominal:     General: Bowel sounds are normal.     Palpations: Abdomen is soft.     Comments:  Ostomy: Brown stool noted   Musculoskeletal:     Comments: Normal Muscle Bulk and Muscle Testing Reveals:  Upper Extremities: Right: Decreased ROM 45 Degrees  and Muscle Strength 5/5 Left Upper Extremity: Decreased ROM 20 Degrees and Muscle Strength 5/5 Bilateral AC Joint Tenderness  Thoracic and Lumbar Hypersensitivity Bilateral Greater Trochanter Tenderness: R>L  Lower Extremities: Full ROM and Muscle Strength 5/5 Bilateral Lower Extremities Flexion produces Pain into her Bilateral Patella Arrived in wheelchair     Skin:    General: Skin is warm and dry.  Neurological:     Mental Status: She is alert and oriented to person, place, and time.  Psychiatric:        Mood and Affect: Mood normal.        Behavior: Behavior normal.          Assessment & Plan:

## 2023-11-10 DIAGNOSIS — I130 Hypertensive heart and chronic kidney disease with heart failure and stage 1 through stage 4 chronic kidney disease, or unspecified chronic kidney disease: Secondary | ICD-10-CM | POA: Diagnosis not present

## 2023-11-10 DIAGNOSIS — Z48815 Encounter for surgical aftercare following surgery on the digestive system: Secondary | ICD-10-CM | POA: Diagnosis not present

## 2023-11-10 DIAGNOSIS — I5032 Chronic diastolic (congestive) heart failure: Secondary | ICD-10-CM | POA: Diagnosis not present

## 2023-11-10 DIAGNOSIS — Z433 Encounter for attention to colostomy: Secondary | ICD-10-CM | POA: Diagnosis not present

## 2023-11-10 DIAGNOSIS — E1122 Type 2 diabetes mellitus with diabetic chronic kidney disease: Secondary | ICD-10-CM | POA: Diagnosis not present

## 2023-11-10 DIAGNOSIS — K56699 Other intestinal obstruction unspecified as to partial versus complete obstruction: Secondary | ICD-10-CM | POA: Diagnosis not present

## 2023-11-11 DIAGNOSIS — Z433 Encounter for attention to colostomy: Secondary | ICD-10-CM | POA: Diagnosis not present

## 2023-11-11 DIAGNOSIS — Z48815 Encounter for surgical aftercare following surgery on the digestive system: Secondary | ICD-10-CM | POA: Diagnosis not present

## 2023-11-11 DIAGNOSIS — I5032 Chronic diastolic (congestive) heart failure: Secondary | ICD-10-CM | POA: Diagnosis not present

## 2023-11-11 DIAGNOSIS — I130 Hypertensive heart and chronic kidney disease with heart failure and stage 1 through stage 4 chronic kidney disease, or unspecified chronic kidney disease: Secondary | ICD-10-CM | POA: Diagnosis not present

## 2023-11-11 DIAGNOSIS — K56699 Other intestinal obstruction unspecified as to partial versus complete obstruction: Secondary | ICD-10-CM | POA: Diagnosis not present

## 2023-11-11 DIAGNOSIS — E1122 Type 2 diabetes mellitus with diabetic chronic kidney disease: Secondary | ICD-10-CM | POA: Diagnosis not present

## 2023-11-12 ENCOUNTER — Ambulatory Visit: Payer: Self-pay

## 2023-11-12 DIAGNOSIS — K56699 Other intestinal obstruction unspecified as to partial versus complete obstruction: Secondary | ICD-10-CM | POA: Diagnosis not present

## 2023-11-12 DIAGNOSIS — I5032 Chronic diastolic (congestive) heart failure: Secondary | ICD-10-CM | POA: Diagnosis not present

## 2023-11-12 DIAGNOSIS — Z433 Encounter for attention to colostomy: Secondary | ICD-10-CM | POA: Diagnosis not present

## 2023-11-12 DIAGNOSIS — E1122 Type 2 diabetes mellitus with diabetic chronic kidney disease: Secondary | ICD-10-CM | POA: Diagnosis not present

## 2023-11-12 DIAGNOSIS — I130 Hypertensive heart and chronic kidney disease with heart failure and stage 1 through stage 4 chronic kidney disease, or unspecified chronic kidney disease: Secondary | ICD-10-CM | POA: Diagnosis not present

## 2023-11-12 DIAGNOSIS — Z48815 Encounter for surgical aftercare following surgery on the digestive system: Secondary | ICD-10-CM | POA: Diagnosis not present

## 2023-11-12 LAB — DRUG TOX MONITOR 1 W/CONF, ORAL FLD

## 2023-11-12 LAB — DRUG TOX ALC METAB W/CON, ORAL FLD: Alcohol Metabolite: NEGATIVE ng/mL (ref <25)

## 2023-11-12 NOTE — Telephone Encounter (Signed)
 Spoke with patient's son Tod  regarding prior message. Tod stated patient had pulled her oxygen  off her face while patient was sleeping   went down to 77%. Patient was put on 4 l of oxygen  to bring oxygen  up between 89-94%. Patient was pur back down to 3l of oxygen  and staying between 89-94%. Patient is also having left arm pain that feels like muscle spasms had started 11/11/2023 and have taken pain medication and has not helped reduce the pain.  Dr.Ramaswamy can you please advise

## 2023-11-12 NOTE — Telephone Encounter (Signed)
 FYI Only or Action Required?: FYI only for provider.  Patient was last seen in primary care on 10/15/2023 by Mercer Clotilda SAUNDERS, MD. Called Nurse Triage reporting No chief complaint on file.. Symptoms began today. Interventions attempted: Nothing. Symptoms are: stable.  Triage Disposition: Call PCP Now Please see note.  Patient/caregiver understands and will follow disposition?: Yes       Copied from CRM [id number]. Topic: Clinical - Red Word Triage >> Nov 12, 2023  9:29 AM Viola FALCON wrote: Red Word that prompted transfer to Nurse Triage: Chauncey from [facility] is currently with patient, says patient oxygen  is decreasing Reason for Disposition  Oxygen  level (e.g., pulse oximetry) 91 to 94 percent  Answer Assessment - Initial Assessment Questions 1. MAIN CONCERN OR SYMPTOM: What's your main concern? (e.g., low oxygen  level, breathing difficulty) What question do you have?     ---Oxygen  level decreasing, despite being oxygen .     2. ONSET: When did the  start?      ------Today    3. OXYGEN  THERAPY:    - Do you currently use home oxygen ?    - If Yes, ask: What is your oxygen  source? (e.g., O2 tank, O2 concentrator).    - If Yes, ask: How do you get the oxygen ? (e.g., nasal prongs, face mask).    - If Yes, ask: How much oxygen  are you supposed to use? (e.g., 1-2 L [place])       ---- Yes, 02 tank. Typically on 3L.      5. OXYGEN  SATURATION MONITOR:     - Do you use an oxygen  saturation monitor (pulse oximeter) at home?    - If Yes, ask: Where do you place the probe? (e.g., fingertip, ear lobe)     ------ Finger pulse oxxymeter    6. OXYGEN  LEVEL: What is your reading (oxygen  level) today? What is your usual oxygen  saturation reading? (e.g., 95%)     ----- prior to call: 3L at 77%., then 97% 4L  ---- During the call : 3L 94-95% HR 95  7. VSS MONITORING: Do you monitor/measure your oxygen  level or vital signs? (e.g., yes, no, measurements are  automatically sent to provider/call center). Document CURRENT and NORMAL BASELINE values if available.     -  P: What is your pulse rate per minute?   -  RR: What is your respiratory rate per minute?      ------     8. BREATHING DIFFICULTY: Are you having any difficulty breathing? If Yes, ask: How bad is it?  (e.g., none, mild, moderate, severe)    - MILD: No SOB at rest, mild SOB with walking, speaks normally in sentences, able to lie down, no retractions, pulse < 100.    - MODERATE: SOB at rest, SOB with minimal exertion and prefers to sit, cannot lie down flat, speaks in phrases, mild retractions, audible wheezing, pulse 100-120.    - SEVERE: Very SOB at rest, speaks in single words, struggling to breathe, sitting hunched forward, retractions, pulse > 120       ---------- Mild    9. OTHER SYMPTOMS: Do you have any other symptoms? (e.g., fever, change in sputum)     --- SOB, with mild exertion  Protocols used: Oxygen  Monitoring and Hypoxia-A-AH  Reason for Disposition  Oxygen  level (e.g., pulse oximetry) 91 to 94 percent  Answer Assessment - Initial Assessment Questions 1. MAIN CONCERN OR SYMPTOM: What's your main concern? (e.g., low oxygen  level, breathing difficulty) What question  do you have?     ---Oxygen  level decreasing, despite being oxygen .     2. ONSET: When did the  start?      ------Today    3. OXYGEN  THERAPY:    - Do you currently use home oxygen ?    - If Yes, ask: What is your oxygen  source? (e.g., O2 tank, O2 concentrator).    - If Yes, ask: How do you get the oxygen ? (e.g., nasal prongs, face mask).    - If Yes, ask: How much oxygen  are you supposed to use? (e.g., 1-2 L [place])       ---- Yes, 02 tank. Typically on 3L.      5. OXYGEN  SATURATION MONITOR:     - Do you use an oxygen  saturation monitor (pulse oximeter) at home?    - If Yes, ask: Where do you place the probe? (e.g., fingertip, ear lobe)     ------ Finger pulse  oxxymeter    6. OXYGEN  LEVEL: What is your reading (oxygen  level) today? What is your usual oxygen  saturation reading? (e.g., 95%) ----- prior to call: 3L at 77%., then 97% 4L  ---- During the call : 3L 94-95% HR 95  7. VSS MONITORING: Do you monitor/measure your oxygen  level or vital signs? (e.g., yes, no, measurements are automatically sent to provider/call center). Document CURRENT and NORMAL BASELINE values if available.           -----Baseline 02: 93-98% on 3L.    8. BREATHING DIFFICULTY: Are you having any difficulty breathing? If Yes, ask: How bad is it?  (e.g., none, mild, moderate, severe)    - MILD: No SOB at rest, mild SOB with walking, speaks normally in sentences, able to lie down, no retractions, pulse < 100.    - MODERATE: SOB at rest, SOB with minimal exertion and prefers to sit, cannot lie down flat, speaks in phrases, mild retractions, audible wheezing, pulse 100-120.    - SEVERE: Very SOB at rest, speaks in single words, struggling to breathe, sitting hunched forward, retractions, pulse > 120       ---------- Mild: only with exertion    9. OTHER SYMPTOMS: Do you have any other symptoms? (e.g., fever, change in sputum)     --- SOB, with mild exertion      Additional information: PT found patient without her [place] on. Once placed on pt was stating 77% on 3L. This prompted the therapist to call. During the call, patient was titrated up to 4L and was stating 97%. Therapist reported that patient denied SOB, but he noted some labored breathing only with exertion.  Patient able to maintain 96% on 4L.  Patients son was also at bedside and called Pulm Nurse line. Pulm Nurses verbalized they will call the patient back in an attempt to schedule an office visit within 24hrs  No additional questions/concerns noted during the time of the call.  Protocols used: Oxygen  Monitoring and Hypoxia-A-AH  Answer Assessment - Initial Assessment Questions 1. MAIN CONCERN  OR SYMPTOM: What's your main concern? (e.g., low oxygen  level, breathing difficulty) What question do you have?     ---Oxygen  level decreasing, despite being oxygen .     2. ONSET: When did the  start?      ------Today    3. OXYGEN  THERAPY:    - Do you currently use home oxygen ?    - If Yes, ask: What is your oxygen  source? (e.g., O2 tank, O2 concentrator).    - If Yes,  ask: How do you get the oxygen ? (e.g., nasal prongs, face mask).    - If Yes, ask: How much oxygen  are you supposed to use? (e.g., 1-2 L [place])       ---- Yes, 02 tank. Typically on 3L.      5. OXYGEN  SATURATION MONITOR:     - Do you use an oxygen  saturation monitor (pulse oximeter) at home?    - If Yes, ask: Where do you place the probe? (e.g., fingertip, ear lobe)     ------ Finger pulse oxxymeter    6. OXYGEN  LEVEL: What is your reading (oxygen  level) today? What is your usual oxygen  saturation reading? (e.g., 95%) ----- prior to call: 3L at 77%., then 97% 4L  ---- During the call : 3L 94-95% HR 95  7. VSS MONITORING: Do you monitor/measure your oxygen  level or vital signs? (e.g., yes, no, measurements are automatically sent to provider/call center). Document CURRENT and NORMAL BASELINE values if available.           -----Baseline 02: 93-98% on 3L.    8. BREATHING DIFFICULTY: Are you having any difficulty breathing? If Yes, ask: How bad is it?  (e.g., none, mild, moderate, severe)    - MILD: No SOB at rest, mild SOB with walking, speaks normally in sentences, able to lie down, no retractions, pulse < 100.    - MODERATE: SOB at rest, SOB with minimal exertion and prefers to sit, cannot lie down flat, speaks in phrases, mild retractions, audible wheezing, pulse 100-120.    - SEVERE: Very SOB at rest, speaks in single words, struggling to breathe, sitting hunched forward, retractions, pulse > 120       ---------- Mild: only with exertion    9. OTHER SYMPTOMS: Do you have any  other symptoms? (e.g., fever, change in sputum)     --- SOB, with mild exertion      Additional information: PT found patient without her [place] on. Once placed on pt was stating 77% on 3L. This prompted the therapist to call. During the call, patient was titrated up to 4L and was stating 97%. Therapist reported that patient denied SOB, but he noted some labored breathing only with exertion.  Patient able to maintain 96% on 4L.  Patients son was also at bedside and called Pulm Nurse line. Pulm Nurses verbalized they will call the patient back in an attempt to schedule an office visit within 24hrs  No additional questions/concerns noted during the time of the call.  Protocols used: Oxygen  Monitoring and Hypoxia-A-AH  Reason for Disposition  [1] Fall in oxygen  level 4% or more (below known patient baseline, while awake and resting) AND [2] new or worse difficulty breathing  Answer Assessment - Initial Assessment Questions 1. MAIN CONCERN OR SYMPTOM: What's your main concern? (e.g., low oxygen  level, breathing difficulty) What question do you have?     ---Oxygen  level decreasing, despite being oxygen .     2. ONSET: When did the  start?      ------Today    3. OXYGEN  THERAPY:    - Do you currently use home oxygen ?    - If Yes, ask: What is your oxygen  source? (e.g., O2 tank, O2 concentrator).    - If Yes, ask: How do you get the oxygen ? (e.g., nasal prongs, face mask).    - If Yes, ask: How much oxygen  are you supposed to use? (e.g., 1-2 L [place])       ---- Yes, 02 tank. Typically  on 3L.      5. OXYGEN  SATURATION MONITOR:     - Do you use an oxygen  saturation monitor (pulse oximeter) at home?    - If Yes, ask: Where do you place the probe? (e.g., fingertip, ear lobe)     ------ Finger pulse oxxymeter    6. OXYGEN  LEVEL: What is your reading (oxygen  level) today? What is your usual oxygen  saturation reading? (e.g., 95%) ----- prior to call: 3L at 77%.,  then 97% 4L  ---- During the call : 3L 94-95% HR 95  7. VSS MONITORING: Do you monitor/measure your oxygen  level or vital signs? (e.g., yes, no, measurements are automatically sent to provider/call center). Document CURRENT and NORMAL BASELINE values if available.           -----Baseline 02: 93-98% on 3L.    8. BREATHING DIFFICULTY: Are you having any difficulty breathing? If Yes, ask: How bad is it?  (e.g., none, mild, moderate, severe)    - MILD: No SOB at rest, mild SOB with walking, speaks normally in sentences, able to lie down, no retractions, pulse < 100.    - MODERATE: SOB at rest, SOB with minimal exertion and prefers to sit, cannot lie down flat, speaks in phrases, mild retractions, audible wheezing, pulse 100-120.    - SEVERE: Very SOB at rest, speaks in single words, struggling to breathe, sitting hunched forward, retractions, pulse > 120       ---------- Mild: only with exertion    9. OTHER SYMPTOMS: Do you have any other symptoms? (e.g., fever, change in sputum)     --- SOB, with mild exertion      Additional information: PT found patient without her [place] on. Once placed on pt was stating 77% on 3L. This prompted the therapist to call. During the call, patient was titrated up to 4L and was stating 97%. Therapist reported that patient denied SOB, but he noted some labored breathing only with exertion.  Patient able to maintain 96% on 4L.  Patients son was also at bedside and called Pulm Nurse line. Pulm Nurses verbalized they will call the patient back in an attempt to schedule an office visit within 24hrs  No additional questions/concerns noted during the time of the call.  Protocols used: Oxygen  Monitoring and Hypoxia-A-AH  Answer Assessment - Initial Assessment Questions 1. MAIN CONCERN OR SYMPTOM: What's your main concern? (e.g., low oxygen  level, breathing difficulty) What question do you have?     ---Oxygen  level decreasing, despite being oxygen .      2. ONSET: When did the  start?      ------Today    3. OXYGEN  THERAPY:    - Do you currently use home oxygen ?    - If Yes, ask: What is your oxygen  source? (e.g., O2 tank, O2 concentrator).    - If Yes, ask: How do you get the oxygen ? (e.g., nasal prongs, face mask).    - If Yes, ask: How much oxygen  are you supposed to use? (e.g., 1-2 L [place])       ---- Yes, 02 tank. Typically on 3L.      5. OXYGEN  SATURATION MONITOR:     - Do you use an oxygen  saturation monitor (pulse oximeter) at home?    - If Yes, ask: Where do you place the probe? (e.g., fingertip, ear lobe)     ------ Finger pulse oxxymeter    6. OXYGEN  LEVEL: What is your reading (oxygen  level) today? What is your usual  oxygen  saturation reading? (e.g., 95%) ----- prior to call: 3L at 77%., then 97% 4L  ---- During the call : 3L 94-95% HR 95  7. VSS MONITORING: Do you monitor/measure your oxygen  level or vital signs? (e.g., yes, no, measurements are automatically sent to provider/call center). Document CURRENT and NORMAL BASELINE values if available.           -----Baseline 02: 93-98% on 3L.    8. BREATHING DIFFICULTY: Are you having any difficulty breathing? If Yes, ask: How bad is it?  (e.g., none, mild, moderate, severe)    - MILD: No SOB at rest, mild SOB with walking, speaks normally in sentences, able to lie down, no retractions, pulse < 100.    - MODERATE: SOB at rest, SOB with minimal exertion and prefers to sit, cannot lie down flat, speaks in phrases, mild retractions, audible wheezing, pulse 100-120.    - SEVERE: Very SOB at rest, speaks in single words, struggling to breathe, sitting hunched forward, retractions, pulse > 120       ---------- Mild: only with exertion    9. OTHER SYMPTOMS: Do you have any other symptoms? (e.g., fever, change in sputum)     --- SOB, with mild exertion      Additional information: PT found patient without her [place] on. Once placed on pt was  stating 77% on 3L. This prompted the therapist to call. During the call, patient was titrated up to 4L and was stating 97%. Therapist reported that patient denied SOB, but he noted some labored breathing only with exertion.  Patient able to maintain 96% on 4L.  Patients son was also at bedside and called Pulm Nurse line. Pulm Nurses verbalized they will call the patient back in an attempt to schedule an office visit within 24hrs  No additional questions/concerns noted during the time of the call.  Protocols used: Oxygen  Monitoring and Hypoxia-A-AH  Reason for Disposition  Oxygen  level (e.g., pulse oximetry) 91 to 94 percent  Answer Assessment - Initial Assessment Questions 1. MAIN CONCERN OR SYMPTOM: What's your main concern? (e.g., low oxygen  level, breathing difficulty) What question do you have?     ---Oxygen  level decreasing, despite being oxygen .     2. ONSET: When did the  start?      ------Today    3. OXYGEN  THERAPY:    - Do you currently use home oxygen ?    - If Yes, ask: What is your oxygen  source? (e.g., O2 tank, O2 concentrator).    - If Yes, ask: How do you get the oxygen ? (e.g., nasal prongs, face mask).    - If Yes, ask: How much oxygen  are you supposed to use? (e.g., 1-2 L [place])       ---- Yes, 02 tank. Typically on 3L.      5. OXYGEN  SATURATION MONITOR:     - Do you use an oxygen  saturation monitor (pulse oximeter) at home?    - If Yes, ask: Where do you place the probe? (e.g., fingertip, ear lobe)     ------ Finger pulse oxxymeter    6. OXYGEN  LEVEL: What is your reading (oxygen  level) today? What is your usual oxygen  saturation reading? (e.g., 95%) ----- prior to call: 3L at 77%., then 97% 4L  ---- During the call : 3L 94-95% HR 95  7. VSS MONITORING: Do you monitor/measure your oxygen  level or vital signs? (e.g., yes, no, measurements are automatically sent to provider/call center). Document CURRENT and NORMAL BASELINE values if  available.           -----Baseline 02: 93-98% on 3L.    8. BREATHING DIFFICULTY: Are you having any difficulty breathing? If Yes, ask: How bad is it?  (e.g., none, mild, moderate, severe)    - MILD: No SOB at rest, mild SOB with walking, speaks normally in sentences, able to lie down, no retractions, pulse < 100.    - MODERATE: SOB at rest, SOB with minimal exertion and prefers to sit, cannot lie down flat, speaks in phrases, mild retractions, audible wheezing, pulse 100-120.    - SEVERE: Very SOB at rest, speaks in single words, struggling to breathe, sitting hunched forward, retractions, pulse > 120       ---------- Mild: only with exertion    9. OTHER SYMPTOMS: Do you have any other symptoms? (e.g., fever, change in sputum)     --- SOB, with mild exertion      Additional information: PT found patient without her [place] on. Once placed on pt was stating 77% on 3L. This prompted the therapist to call. During the call, patient was titrated up to 4L and was stating 97%. Therapist reported that patient denied SOB, but he noted some labored breathing only with exertion.  Patient able to maintain 96% on 4L.  Patients son was also at bedside and called Pulm Nurse line. Pulm Nurses verbalized they will call the patient back in an attempt to schedule an office visit within 24hrs  No additional questions/concerns noted during the time of the call.  Protocols used: Oxygen  Monitoring and Hypoxia-A-AH  Reason for Disposition  Oxygen  level (e.g., pulse oximetry) 91 to 94 percent  Answer Assessment - Initial Assessment Questions 1. MAIN CONCERN OR SYMPTOM: What's your main concern? (e.g., low oxygen  level, breathing difficulty) What question do you have?     ---Oxygen  level decreasing, despite being oxygen .     2. ONSET: When did the  start?      ------Today    3. OXYGEN  THERAPY:    - Do you currently use home oxygen ?    - If Yes, ask: What is your oxygen  source?  (e.g., O2 tank, O2 concentrator).    - If Yes, ask: How do you get the oxygen ? (e.g., nasal prongs, face mask).    - If Yes, ask: How much oxygen  are you supposed to use? (e.g., 1-2 L [place])       ---- Yes, 02 tank. Typically on 3L.      5. OXYGEN  SATURATION MONITOR:     - Do you use an oxygen  saturation monitor (pulse oximeter) at home?    - If Yes, ask: Where do you place the probe? (e.g., fingertip, ear lobe)     ------ Finger pulse oxxymeter    6. OXYGEN  LEVEL: What is your reading (oxygen  level) today? What is your usual oxygen  saturation reading? (e.g., 95%) ----- prior to call: 3L at 77%., then 97% 4L  ---- During the call : 3L 94-95% HR 95  7. VSS MONITORING: Do you monitor/measure your oxygen  level or vital signs? (e.g., yes, no, measurements are automatically sent to provider/call center). Document CURRENT and NORMAL BASELINE values if available.           -----Baseline 02: 93-98% on 3L.    8. BREATHING DIFFICULTY: Are you having any difficulty breathing? If Yes, ask: How bad is it?  (e.g., none, mild, moderate, severe)    - MILD: No SOB at rest, mild SOB with walking, speaks normally  in sentences, able to lie down, no retractions, pulse < 100.    - MODERATE: SOB at rest, SOB with minimal exertion and prefers to sit, cannot lie down flat, speaks in phrases, mild retractions, audible wheezing, pulse 100-120.    - SEVERE: Very SOB at rest, speaks in single words, struggling to breathe, sitting hunched forward, retractions, pulse > 120       ---------- Mild: only with exertion    9. OTHER SYMPTOMS: Do you have any other symptoms? (e.g., fever, change in sputum)     --- SOB, with mild exertion      Additional information: PT found patient without her [place] on. Once placed on pt was stating 77% on 3L. This prompted the therapist to call. During the call, patient was titrated up to 4L and was stating 97%. Therapist reported that patient denied SOB, but  he noted some labored breathing only with exertion.   Per chart review, oxygen  order noted for just bedtime.  Per son, patient has been using her oxygen  during the day for a few months now. Unsure as to where the discrepancy is.   Patient able to maintain 96% on 4L during the call. Patients son was also at bedside and called Pulm Nurse line. Pulm Nurses verbalized they will call the patient back in an attempt to schedule an office visit within 24hrs  No additional questions/concerns noted during the time of the call.  Protocols used: Oxygen  Monitoring and Hypoxia-A-AH

## 2023-11-13 ENCOUNTER — Other Ambulatory Visit: Payer: Self-pay

## 2023-11-13 ENCOUNTER — Emergency Department (HOSPITAL_COMMUNITY)

## 2023-11-13 ENCOUNTER — Encounter (HOSPITAL_COMMUNITY): Payer: Self-pay | Admitting: Internal Medicine

## 2023-11-13 ENCOUNTER — Inpatient Hospital Stay (HOSPITAL_COMMUNITY)
Admission: EM | Admit: 2023-11-13 | Discharge: 2023-11-22 | DRG: 853 | Disposition: A | Discharge status: Skilled Nursing Facility | Attending: Internal Medicine | Admitting: Internal Medicine

## 2023-11-13 ENCOUNTER — Ambulatory Visit: Payer: Self-pay

## 2023-11-13 DIAGNOSIS — J8410 Pulmonary fibrosis, unspecified: Secondary | ICD-10-CM | POA: Diagnosis not present

## 2023-11-13 DIAGNOSIS — J440 Chronic obstructive pulmonary disease with acute lower respiratory infection: Secondary | ICD-10-CM | POA: Diagnosis present

## 2023-11-13 DIAGNOSIS — D509 Iron deficiency anemia, unspecified: Secondary | ICD-10-CM | POA: Diagnosis not present

## 2023-11-13 DIAGNOSIS — K219 Gastro-esophageal reflux disease without esophagitis: Secondary | ICD-10-CM | POA: Diagnosis present

## 2023-11-13 DIAGNOSIS — E785 Hyperlipidemia, unspecified: Secondary | ICD-10-CM | POA: Diagnosis present

## 2023-11-13 DIAGNOSIS — Z885 Allergy status to narcotic agent status: Secondary | ICD-10-CM

## 2023-11-13 DIAGNOSIS — J849 Interstitial pulmonary disease, unspecified: Secondary | ICD-10-CM | POA: Diagnosis not present

## 2023-11-13 DIAGNOSIS — M069 Rheumatoid arthritis, unspecified: Secondary | ICD-10-CM | POA: Diagnosis not present

## 2023-11-13 DIAGNOSIS — M7582 Other shoulder lesions, left shoulder: Secondary | ICD-10-CM | POA: Diagnosis not present

## 2023-11-13 DIAGNOSIS — M79602 Pain in left arm: Principal | ICD-10-CM

## 2023-11-13 DIAGNOSIS — E1122 Type 2 diabetes mellitus with diabetic chronic kidney disease: Secondary | ICD-10-CM | POA: Diagnosis not present

## 2023-11-13 DIAGNOSIS — Z794 Long term (current) use of insulin: Secondary | ICD-10-CM | POA: Diagnosis not present

## 2023-11-13 DIAGNOSIS — R5381 Other malaise: Secondary | ICD-10-CM | POA: Diagnosis not present

## 2023-11-13 DIAGNOSIS — M25412 Effusion, left shoulder: Secondary | ICD-10-CM | POA: Diagnosis not present

## 2023-11-13 DIAGNOSIS — Z86711 Personal history of pulmonary embolism: Secondary | ICD-10-CM

## 2023-11-13 DIAGNOSIS — L02414 Cutaneous abscess of left upper limb: Secondary | ICD-10-CM | POA: Diagnosis not present

## 2023-11-13 DIAGNOSIS — G894 Chronic pain syndrome: Secondary | ICD-10-CM | POA: Diagnosis present

## 2023-11-13 DIAGNOSIS — M7989 Other specified soft tissue disorders: Secondary | ICD-10-CM | POA: Diagnosis not present

## 2023-11-13 DIAGNOSIS — R2681 Unsteadiness on feet: Secondary | ICD-10-CM | POA: Diagnosis not present

## 2023-11-13 DIAGNOSIS — N189 Chronic kidney disease, unspecified: Secondary | ICD-10-CM | POA: Diagnosis present

## 2023-11-13 DIAGNOSIS — S46812A Strain of other muscles, fascia and tendons at shoulder and upper arm level, left arm, initial encounter: Secondary | ICD-10-CM | POA: Diagnosis not present

## 2023-11-13 DIAGNOSIS — Z933 Colostomy status: Secondary | ICD-10-CM

## 2023-11-13 DIAGNOSIS — E7849 Other hyperlipidemia: Secondary | ICD-10-CM

## 2023-11-13 DIAGNOSIS — E1142 Type 2 diabetes mellitus with diabetic polyneuropathy: Secondary | ICD-10-CM | POA: Diagnosis not present

## 2023-11-13 DIAGNOSIS — Z87892 Personal history of anaphylaxis: Secondary | ICD-10-CM

## 2023-11-13 DIAGNOSIS — I130 Hypertensive heart and chronic kidney disease with heart failure and stage 1 through stage 4 chronic kidney disease, or unspecified chronic kidney disease: Secondary | ICD-10-CM | POA: Diagnosis present

## 2023-11-13 DIAGNOSIS — M009 Pyogenic arthritis, unspecified: Secondary | ICD-10-CM | POA: Diagnosis not present

## 2023-11-13 DIAGNOSIS — Z9981 Dependence on supplemental oxygen: Secondary | ICD-10-CM

## 2023-11-13 DIAGNOSIS — R7989 Other specified abnormal findings of blood chemistry: Secondary | ICD-10-CM | POA: Insufficient documentation

## 2023-11-13 DIAGNOSIS — G3184 Mild cognitive impairment, so stated: Secondary | ICD-10-CM | POA: Diagnosis not present

## 2023-11-13 DIAGNOSIS — X58XXXA Exposure to other specified factors, initial encounter: Secondary | ICD-10-CM | POA: Diagnosis present

## 2023-11-13 DIAGNOSIS — Z90710 Acquired absence of both cervix and uterus: Secondary | ICD-10-CM

## 2023-11-13 DIAGNOSIS — M129 Arthropathy, unspecified: Secondary | ICD-10-CM | POA: Diagnosis not present

## 2023-11-13 DIAGNOSIS — E039 Hypothyroidism, unspecified: Secondary | ICD-10-CM | POA: Diagnosis present

## 2023-11-13 DIAGNOSIS — L89154 Pressure ulcer of sacral region, stage 4: Secondary | ICD-10-CM | POA: Diagnosis not present

## 2023-11-13 DIAGNOSIS — I5032 Chronic diastolic (congestive) heart failure: Secondary | ICD-10-CM | POA: Diagnosis not present

## 2023-11-13 DIAGNOSIS — M05741 Rheumatoid arthritis with rheumatoid factor of right hand without organ or systems involvement: Secondary | ICD-10-CM | POA: Diagnosis not present

## 2023-11-13 DIAGNOSIS — I959 Hypotension, unspecified: Secondary | ICD-10-CM | POA: Diagnosis present

## 2023-11-13 DIAGNOSIS — S42145A Nondisplaced fracture of glenoid cavity of scapula, left shoulder, initial encounter for closed fracture: Secondary | ICD-10-CM | POA: Diagnosis present

## 2023-11-13 DIAGNOSIS — G629 Polyneuropathy, unspecified: Secondary | ICD-10-CM | POA: Diagnosis not present

## 2023-11-13 DIAGNOSIS — Z86011 Personal history of benign neoplasm of the brain: Secondary | ICD-10-CM

## 2023-11-13 DIAGNOSIS — G8918 Other acute postprocedural pain: Secondary | ICD-10-CM | POA: Diagnosis not present

## 2023-11-13 DIAGNOSIS — I5041 Acute combined systolic (congestive) and diastolic (congestive) heart failure: Secondary | ICD-10-CM

## 2023-11-13 DIAGNOSIS — F32A Depression, unspecified: Secondary | ICD-10-CM | POA: Diagnosis present

## 2023-11-13 DIAGNOSIS — Z743 Need for continuous supervision: Secondary | ICD-10-CM | POA: Diagnosis not present

## 2023-11-13 DIAGNOSIS — R2689 Other abnormalities of gait and mobility: Secondary | ICD-10-CM | POA: Diagnosis not present

## 2023-11-13 DIAGNOSIS — A4102 Sepsis due to Methicillin resistant Staphylococcus aureus: Principal | ICD-10-CM | POA: Diagnosis present

## 2023-11-13 DIAGNOSIS — M86112 Other acute osteomyelitis, left shoulder: Secondary | ICD-10-CM | POA: Diagnosis not present

## 2023-11-13 DIAGNOSIS — M65912 Unspecified synovitis and tenosynovitis, left shoulder: Secondary | ICD-10-CM | POA: Diagnosis not present

## 2023-11-13 DIAGNOSIS — I700 Atherosclerosis of aorta: Secondary | ICD-10-CM | POA: Diagnosis present

## 2023-11-13 DIAGNOSIS — M60001 Infective myositis, unspecified left arm: Secondary | ICD-10-CM | POA: Diagnosis present

## 2023-11-13 DIAGNOSIS — Z96612 Presence of left artificial shoulder joint: Secondary | ICD-10-CM | POA: Diagnosis not present

## 2023-11-13 DIAGNOSIS — K5909 Other constipation: Secondary | ICD-10-CM | POA: Diagnosis present

## 2023-11-13 DIAGNOSIS — G8929 Other chronic pain: Secondary | ICD-10-CM | POA: Diagnosis not present

## 2023-11-13 DIAGNOSIS — R7881 Bacteremia: Secondary | ICD-10-CM | POA: Diagnosis not present

## 2023-11-13 DIAGNOSIS — D631 Anemia in chronic kidney disease: Secondary | ICD-10-CM | POA: Diagnosis present

## 2023-11-13 DIAGNOSIS — M00012 Staphylococcal arthritis, left shoulder: Secondary | ICD-10-CM | POA: Diagnosis present

## 2023-11-13 DIAGNOSIS — E119 Type 2 diabetes mellitus without complications: Secondary | ICD-10-CM | POA: Diagnosis not present

## 2023-11-13 DIAGNOSIS — I82602 Acute embolism and thrombosis of unspecified veins of left upper extremity: Secondary | ICD-10-CM | POA: Diagnosis not present

## 2023-11-13 DIAGNOSIS — I110 Hypertensive heart disease with heart failure: Secondary | ICD-10-CM | POA: Diagnosis not present

## 2023-11-13 DIAGNOSIS — Z79890 Hormone replacement therapy (postmenopausal): Secondary | ICD-10-CM

## 2023-11-13 DIAGNOSIS — R079 Chest pain, unspecified: Secondary | ICD-10-CM | POA: Diagnosis not present

## 2023-11-13 DIAGNOSIS — R41841 Cognitive communication deficit: Secondary | ICD-10-CM | POA: Diagnosis not present

## 2023-11-13 DIAGNOSIS — E66811 Obesity, class 1: Secondary | ICD-10-CM | POA: Diagnosis present

## 2023-11-13 DIAGNOSIS — D62 Acute posthemorrhagic anemia: Secondary | ICD-10-CM | POA: Diagnosis not present

## 2023-11-13 DIAGNOSIS — R0789 Other chest pain: Secondary | ICD-10-CM | POA: Diagnosis not present

## 2023-11-13 DIAGNOSIS — M869 Osteomyelitis, unspecified: Secondary | ICD-10-CM | POA: Diagnosis present

## 2023-11-13 DIAGNOSIS — B9562 Methicillin resistant Staphylococcus aureus infection as the cause of diseases classified elsewhere: Secondary | ICD-10-CM | POA: Diagnosis not present

## 2023-11-13 DIAGNOSIS — Z741 Need for assistance with personal care: Secondary | ICD-10-CM | POA: Diagnosis not present

## 2023-11-13 DIAGNOSIS — R6510 Systemic inflammatory response syndrome (SIRS) of non-infectious origin without acute organ dysfunction: Secondary | ICD-10-CM | POA: Insufficient documentation

## 2023-11-13 DIAGNOSIS — I2510 Atherosclerotic heart disease of native coronary artery without angina pectoris: Secondary | ICD-10-CM | POA: Diagnosis not present

## 2023-11-13 DIAGNOSIS — R918 Other nonspecific abnormal finding of lung field: Secondary | ICD-10-CM | POA: Diagnosis not present

## 2023-11-13 DIAGNOSIS — M05742 Rheumatoid arthritis with rheumatoid factor of left hand without organ or systems involvement: Secondary | ICD-10-CM | POA: Diagnosis not present

## 2023-11-13 DIAGNOSIS — J189 Pneumonia, unspecified organism: Secondary | ICD-10-CM | POA: Diagnosis not present

## 2023-11-13 DIAGNOSIS — S43432A Superior glenoid labrum lesion of left shoulder, initial encounter: Secondary | ICD-10-CM | POA: Diagnosis not present

## 2023-11-13 DIAGNOSIS — Z8719 Personal history of other diseases of the digestive system: Secondary | ICD-10-CM | POA: Diagnosis not present

## 2023-11-13 DIAGNOSIS — Z8679 Personal history of other diseases of the circulatory system: Secondary | ICD-10-CM

## 2023-11-13 DIAGNOSIS — R600 Localized edema: Secondary | ICD-10-CM | POA: Diagnosis not present

## 2023-11-13 DIAGNOSIS — I509 Heart failure, unspecified: Secondary | ICD-10-CM | POA: Diagnosis not present

## 2023-11-13 DIAGNOSIS — Z888 Allergy status to other drugs, medicaments and biological substances status: Secondary | ICD-10-CM

## 2023-11-13 DIAGNOSIS — R000 Tachycardia, unspecified: Secondary | ICD-10-CM | POA: Diagnosis present

## 2023-11-13 DIAGNOSIS — Z5181 Encounter for therapeutic drug level monitoring: Secondary | ICD-10-CM | POA: Diagnosis not present

## 2023-11-13 DIAGNOSIS — J9611 Chronic respiratory failure with hypoxia: Secondary | ICD-10-CM | POA: Diagnosis not present

## 2023-11-13 DIAGNOSIS — R609 Edema, unspecified: Secondary | ICD-10-CM | POA: Diagnosis not present

## 2023-11-13 DIAGNOSIS — I82622 Acute embolism and thrombosis of deep veins of left upper extremity: Secondary | ICD-10-CM | POA: Diagnosis not present

## 2023-11-13 DIAGNOSIS — K529 Noninfective gastroenteritis and colitis, unspecified: Secondary | ICD-10-CM | POA: Diagnosis not present

## 2023-11-13 DIAGNOSIS — D696 Thrombocytopenia, unspecified: Secondary | ICD-10-CM | POA: Diagnosis not present

## 2023-11-13 DIAGNOSIS — Z79899 Other long term (current) drug therapy: Secondary | ICD-10-CM

## 2023-11-13 DIAGNOSIS — F411 Generalized anxiety disorder: Secondary | ICD-10-CM | POA: Diagnosis not present

## 2023-11-13 DIAGNOSIS — Z8249 Family history of ischemic heart disease and other diseases of the circulatory system: Secondary | ICD-10-CM

## 2023-11-13 DIAGNOSIS — I272 Pulmonary hypertension, unspecified: Secondary | ICD-10-CM | POA: Diagnosis not present

## 2023-11-13 DIAGNOSIS — E1140 Type 2 diabetes mellitus with diabetic neuropathy, unspecified: Secondary | ICD-10-CM | POA: Diagnosis present

## 2023-11-13 DIAGNOSIS — M6281 Muscle weakness (generalized): Secondary | ICD-10-CM | POA: Diagnosis not present

## 2023-11-13 DIAGNOSIS — M25511 Pain in right shoulder: Secondary | ICD-10-CM | POA: Diagnosis not present

## 2023-11-13 DIAGNOSIS — Z6834 Body mass index (BMI) 34.0-34.9, adult: Secondary | ICD-10-CM | POA: Diagnosis not present

## 2023-11-13 DIAGNOSIS — L89626 Pressure-induced deep tissue damage of left heel: Secondary | ICD-10-CM | POA: Diagnosis present

## 2023-11-13 DIAGNOSIS — Z8739 Personal history of other diseases of the musculoskeletal system and connective tissue: Secondary | ICD-10-CM

## 2023-11-13 DIAGNOSIS — Z7952 Long term (current) use of systemic steroids: Secondary | ICD-10-CM

## 2023-11-13 DIAGNOSIS — Z471 Aftercare following joint replacement surgery: Secondary | ICD-10-CM | POA: Diagnosis not present

## 2023-11-13 DIAGNOSIS — F419 Anxiety disorder, unspecified: Secondary | ICD-10-CM | POA: Diagnosis not present

## 2023-11-13 DIAGNOSIS — E7800 Pure hypercholesterolemia, unspecified: Secondary | ICD-10-CM | POA: Diagnosis present

## 2023-11-13 LAB — CBC
HCT: 30.7 % — ABNORMAL LOW (ref 36.0–46.0)
Hemoglobin: 8.9 g/dL — ABNORMAL LOW (ref 12.0–15.0)
MCH: 28.6 pg (ref 26.0–34.0)
MCHC: 29 g/dL — ABNORMAL LOW (ref 30.0–36.0)
MCV: 98.7 fL (ref 80.0–100.0)
Platelets: 153 10*3/uL (ref 150–400)
RBC: 3.11 MIL/uL — ABNORMAL LOW (ref 3.87–5.11)
RDW: 15.8 % — ABNORMAL HIGH (ref 11.5–15.5)
WBC: 10.5 10*3/uL (ref 4.0–10.5)
nRBC: 0 % (ref 0.0–0.2)

## 2023-11-13 LAB — BASIC METABOLIC PANEL WITH GFR
Anion gap: 11 (ref 5–15)
BUN: 16 mg/dL (ref 8–23)
CO2: 28 mmol/L (ref 22–32)
Calcium: 8 mg/dL — ABNORMAL LOW (ref 8.9–10.3)
Chloride: 100 mmol/L (ref 98–111)
Creatinine, Ser: 0.95 mg/dL (ref 0.44–1.00)
GFR, Estimated: >60 mL/min (ref >60)
Glucose, Bld: 152 mg/dL — ABNORMAL HIGH (ref 70–99)
Potassium: 4.1 mmol/L (ref 3.5–5.1)
Sodium: 139 mmol/L (ref 135–145)

## 2023-11-13 LAB — CBG MONITORING, ED: Glucose-Capillary: 135 mg/dL — ABNORMAL HIGH (ref 70–99)

## 2023-11-13 LAB — TROPONIN I (HIGH SENSITIVITY)
Troponin I (High Sensitivity): 23 ng/L — ABNORMAL HIGH (ref <18)
Troponin I (High Sensitivity): 21 ng/L — ABNORMAL HIGH (ref <18)

## 2023-11-13 MED ORDER — GUAIFENESIN ER 600 MG PO TB12
1200.0000 mg | ORAL_TABLET | Freq: Two times a day (BID) | ORAL | Status: DC
  Administered 2023-11-13 – 2023-11-22 (×18): 1200 mg via ORAL
  Filled 2023-11-13 (×18): qty 2

## 2023-11-13 MED ORDER — ALBUTEROL SULFATE HFA 108 (90 BASE) MCG/ACT IN AERS: 2.0000 | INHALATION_SPRAY | Freq: Four times a day (QID) | RESPIRATORY_TRACT | Status: DC | PRN

## 2023-11-13 MED ORDER — INSULIN ASPART 100 UNIT/ML IJ SOLN: 0.0000 [IU] | Freq: Every day | INTRAMUSCULAR | Status: DC

## 2023-11-13 MED ORDER — METOPROLOL TARTRATE 25 MG PO TABS: 25.0000 mg | ORAL_TABLET | Freq: Every day | ORAL | Status: DC

## 2023-11-13 MED ORDER — ATORVASTATIN CALCIUM 10 MG PO TABS
10.0000 mg | ORAL_TABLET | Freq: Every day | ORAL | Status: DC
  Administered 2023-11-14 – 2023-11-22 (×9): 10 mg via ORAL
  Filled 2023-11-13 (×9): qty 1

## 2023-11-13 MED ORDER — SODIUM CHLORIDE 0.9% FLUSH
3.0000 mL | Freq: Two times a day (BID) | INTRAVENOUS | Status: DC
  Administered 2023-11-13 – 2023-11-21 (×11): 3 mL via INTRAVENOUS

## 2023-11-13 MED ORDER — ONDANSETRON HCL 4 MG/2ML IJ SOLN: 4.0000 mg | Freq: Four times a day (QID) | INTRAMUSCULAR | Status: DC | PRN

## 2023-11-13 MED ORDER — PREDNISONE 10 MG PO TABS
10.0000 mg | ORAL_TABLET | Freq: Every day | ORAL | Status: DC
  Administered 2023-11-14 – 2023-11-22 (×9): 10 mg via ORAL
  Filled 2023-11-13: qty 2
  Filled 2023-11-13 (×8): qty 1

## 2023-11-13 MED ORDER — GABAPENTIN 300 MG PO CAPS
300.0000 mg | ORAL_CAPSULE | Freq: Two times a day (BID) | ORAL | Status: DC
  Administered 2023-11-13 – 2023-11-22 (×17): 300 mg via ORAL
  Filled 2023-11-13 (×18): qty 1

## 2023-11-13 MED ORDER — VANCOMYCIN HCL 2000 MG/400ML IV SOLN
2000.0000 mg | Freq: Once | INTRAVENOUS | Status: AC
  Administered 2023-11-14: 2000 mg via INTRAVENOUS
  Filled 2023-11-13: qty 400

## 2023-11-13 MED ORDER — VITAMIN D3 50 MCG (2000 UT) PO TABS: 2000.0000 [IU] | ORAL_TABLET | Freq: Every day | ORAL | Status: DC

## 2023-11-13 MED ORDER — METHOCARBAMOL 500 MG PO TABS
500.0000 mg | ORAL_TABLET | Freq: Four times a day (QID) | ORAL | Status: DC | PRN
  Administered 2023-11-14 – 2023-11-20 (×10): 500 mg via ORAL
  Filled 2023-11-13 (×10): qty 1

## 2023-11-13 MED ORDER — PANTOPRAZOLE SODIUM 40 MG PO TBEC
40.0000 mg | DELAYED_RELEASE_TABLET | Freq: Two times a day (BID) | ORAL | Status: DC
  Administered 2023-11-13 – 2023-11-22 (×18): 40 mg via ORAL
  Filled 2023-11-13 (×18): qty 1

## 2023-11-13 MED ORDER — LACTATED RINGERS IV BOLUS
1000.0000 mL | Freq: Once | INTRAVENOUS | Status: AC
  Administered 2023-11-13: 1000 mL via INTRAVENOUS

## 2023-11-13 MED ORDER — SODIUM CHLORIDE 0.9% FLUSH: 3.0000 mL | INTRAVENOUS | Status: DC | PRN

## 2023-11-13 MED ORDER — METOPROLOL TARTRATE 25 MG PO TABS: 25.0000 mg | ORAL_TABLET | Freq: Two times a day (BID) | ORAL | Status: DC

## 2023-11-13 MED ORDER — ONDANSETRON HCL 4 MG/2ML IJ SOLN
4.0000 mg | Freq: Once | INTRAMUSCULAR | Status: AC
  Administered 2023-11-13: 4 mg via INTRAVENOUS
  Filled 2023-11-13: qty 2

## 2023-11-13 MED ORDER — METOPROLOL TARTRATE 25 MG PO TABS: 100.0000 mg | ORAL_TABLET | Freq: Every day | ORAL | Status: DC

## 2023-11-13 MED ORDER — INSULIN ASPART 100 UNIT/ML IJ SOLN
0.0000 [IU] | Freq: Three times a day (TID) | INTRAMUSCULAR | Status: DC
  Administered 2023-11-14: 1 [IU] via SUBCUTANEOUS
  Administered 2023-11-15 – 2023-11-18 (×2): 3 [IU] via SUBCUTANEOUS
  Administered 2023-11-19 (×2): 2 [IU] via SUBCUTANEOUS
  Administered 2023-11-20: 1 [IU] via SUBCUTANEOUS

## 2023-11-13 MED ORDER — ACETAMINOPHEN 325 MG PO TABS
650.0000 mg | ORAL_TABLET | Freq: Four times a day (QID) | ORAL | Status: DC | PRN
  Administered 2023-11-15 – 2023-11-22 (×11): 650 mg via ORAL
  Filled 2023-11-13 (×11): qty 2

## 2023-11-13 MED ORDER — VITAMIN D 25 MCG (1000 UNIT) PO TABS
1000.0000 [IU] | ORAL_TABLET | Freq: Every day | ORAL | Status: DC
  Administered 2023-11-14 – 2023-11-22 (×9): 1000 [IU] via ORAL
  Filled 2023-11-13 (×9): qty 1

## 2023-11-13 MED ORDER — METOPROLOL TARTRATE 25 MG PO TABS: 62.5000 mg | ORAL_TABLET | Freq: Two times a day (BID) | ORAL | Status: DC

## 2023-11-13 MED ORDER — POLYETHYLENE GLYCOL 3350 17 GM/SCOOP PO POWD: 17.0000 g | Freq: Every day | ORAL | Status: DC | PRN

## 2023-11-13 MED ORDER — HEPARIN SODIUM (PORCINE) 5000 UNIT/ML IJ SOLN
5000.0000 [IU] | Freq: Three times a day (TID) | INTRAMUSCULAR | Status: DC
  Administered 2023-11-13 – 2023-11-22 (×26): 5000 [IU] via SUBCUTANEOUS
  Filled 2023-11-13 (×26): qty 1

## 2023-11-13 MED ORDER — MORPHINE SULFATE ER 15 MG PO TBCR
15.0000 mg | EXTENDED_RELEASE_TABLET | Freq: Two times a day (BID) | ORAL | Status: DC
  Administered 2023-11-14 – 2023-11-22 (×17): 15 mg via ORAL
  Filled 2023-11-13 (×17): qty 1

## 2023-11-13 MED ORDER — MORPHINE SULFATE (PF) 4 MG/ML IV SOLN
4.0000 mg | Freq: Once | INTRAVENOUS | Status: AC
  Administered 2023-11-13: 4 mg via INTRAVENOUS
  Filled 2023-11-13: qty 1

## 2023-11-13 MED ORDER — LEVOTHYROXINE SODIUM 50 MCG PO TABS
50.0000 ug | ORAL_TABLET | Freq: Every day | ORAL | Status: DC
  Administered 2023-11-14 – 2023-11-22 (×8): 50 ug via ORAL
  Filled 2023-11-13 (×8): qty 1
  Filled 2023-11-13: qty 2

## 2023-11-13 MED ORDER — SODIUM CHLORIDE 0.9 % IV SOLN
2.0000 g | Freq: Two times a day (BID) | INTRAVENOUS | Status: DC
  Administered 2023-11-13 – 2023-11-14 (×3): 2 g via INTRAVENOUS
  Filled 2023-11-13 (×3): qty 12.5

## 2023-11-13 MED ORDER — VANCOMYCIN HCL 1750 MG/350ML IV SOLN
1750.0000 mg | INTRAVENOUS | Status: DC
  Administered 2023-11-15: 1750 mg via INTRAVENOUS
  Filled 2023-11-13: qty 350

## 2023-11-13 MED ORDER — SODIUM CHLORIDE 0.9 % IV SOLN
250.0000 mL | INTRAVENOUS | Status: AC | PRN
Start: 2023-11-13 — End: 2023-11-14

## 2023-11-13 MED ORDER — DULOXETINE HCL 60 MG PO CPEP
60.0000 mg | ORAL_CAPSULE | Freq: Every day | ORAL | Status: DC
  Administered 2023-11-14 – 2023-11-22 (×9): 60 mg via ORAL
  Filled 2023-11-13 (×7): qty 1
  Filled 2023-11-13: qty 2
  Filled 2023-11-13: qty 1

## 2023-11-13 MED ORDER — IPRATROPIUM-ALBUTEROL 0.5-2.5 (3) MG/3ML IN SOLN: 3.0000 mL | Freq: Four times a day (QID) | RESPIRATORY_TRACT | Status: DC | PRN

## 2023-11-13 MED ORDER — SENNA 8.6 MG PO TABS: 17.2000 mg | ORAL_TABLET | Freq: Every day | ORAL | Status: DC | PRN

## 2023-11-13 MED ORDER — IOHEXOL 350 MG/ML SOLN
150.0000 mL | Freq: Once | INTRAVENOUS | Status: AC | PRN
  Administered 2023-11-13: 150 mL via INTRAVENOUS

## 2023-11-13 MED ORDER — POLYETHYLENE GLYCOL 3350 17 G PO PACK: 17.0000 g | PACK | Freq: Every day | ORAL | Status: DC | PRN

## 2023-11-13 MED ORDER — FUROSEMIDE 40 MG PO TABS
40.0000 mg | ORAL_TABLET | Freq: Every day | ORAL | Status: DC
  Administered 2023-11-14 – 2023-11-22 (×9): 40 mg via ORAL
  Filled 2023-11-13 (×5): qty 1
  Filled 2023-11-13: qty 2
  Filled 2023-11-13 (×3): qty 1

## 2023-11-13 MED ORDER — BISACODYL 10 MG RE SUPP: 10.0000 mg | Freq: Every day | RECTAL | Status: DC | PRN

## 2023-11-13 MED ORDER — SODIUM CHLORIDE 0.9% FLUSH
3.0000 mL | Freq: Two times a day (BID) | INTRAVENOUS | Status: DC
  Administered 2023-11-13 – 2023-11-21 (×14): 3 mL via INTRAVENOUS

## 2023-11-13 MED ORDER — HYOSCYAMINE SULFATE 0.125 MG SL SUBL: 0.1250 mg | SUBLINGUAL_TABLET | Freq: Three times a day (TID) | SUBLINGUAL | Status: DC | PRN

## 2023-11-13 NOTE — Telephone Encounter (Signed)
 FYI Only or Action Required?: FYI only for provider.  Patient was last seen in primary care on 10/15/2023 by Mercer Clotilda SAUNDERS, MD. Called Nurse Triage reporting Arm Swelling. Symptoms began a few days. Interventions attempted: Rest, hydration, or home remedies. Symptoms are: gradually worsening.  Triage Disposition: Go to ED or PCP/Alternative with Approval  Patient/caregiver understands and will follow disposition?: Yes       [phone]   Patient's daughter's contact number         Copied from CRM #[id number]. Topic: Clinical - Red Word Triage >> Nov 13, 2023 11:15 AM Adelita E wrote: [facility] that prompted transfer to Nurse Triage: Daughter, Morna, called in stating patient's left arm is swollen, warm to touch, and muscle are contracting / cramping. Going on for 3 days. Reason for Disposition  Patient sounds very sick or weak to the triager    Daughter states that an ambulance might be needed due to the patient's mobility.  This RN offered to call an ambulance for the patient.  The daughter and patient had agreed that this was okay.  Answer Assessment - Initial Assessment Questions 1. ONSET: When did the pain start?     A few days ago 2. LOCATION: Where is the pain located?     Left upper arm 3. PAIN: How bad is the pain? (Scale 1-10; or mild, moderate, severe)   - MILD (1-3): Doesn't interfere with normal activities.   - MODERATE (4-7): Interferes with normal activities (e.g., work or school) or awakens from sleep.   - SEVERE (8-10): Excruciating pain, unable to do any normal activities, unable to hold a cup of water.     Extreme 4. WORK OR EXERCISE: Has there been any recent work or exercise that involved this part of the body?     Daily use 5. CAUSE: What do you think is causing the arm pain?     unsure 6. OTHER SYMPTOMS: Do you have any other symptoms? (e.g., neck pain, swelling, rash, fever, numbness, weakness)     Swelling, arm is hot to touch, neck  all the way down on left side hurts on that entire side x 2 days--patient states it started with her shoulder aching and there is a lot of swelling down to the elbow.    Daughter and patient are advised that the best recommendation at this time is that the patient goes to the Emergency Room--- Daughter states that the patient's speech is normal and she does not look like she has any facial droop per the patient's daughter.  95% on 4 liters of Oxygen  Left arm contracting---left arm swelling and pain Family had to increase patient's Oxygen  from 3 liters to help  Daughter states patient in extreme pain and heart rate was in the 130s.  [phone]   Patient's daughter's contact number   This RN offered to call an ambulance at this time.  11:35 AM--911 is called with patient's daughter on the phone 11:52 AM---Emergency personnel arrived at patient's home & patient's daughter advised they didn't need anymore assistance from RN Triage at this time.  Protocols used: Arm Pain-A-AH

## 2023-11-13 NOTE — ED Notes (Signed)
 Pt requesting to be moved upstairs to room. This RN explained to patient that we are waiting on bed placement on the floor unit before we can move from ER. Patient verbalized understanding. Pt given more warm blankets.

## 2023-11-13 NOTE — ED Notes (Signed)
Pt ostomy bag emptied

## 2023-11-13 NOTE — ED Triage Notes (Signed)
 Pt bib [facility] from home. Pt reports that she has had increased swelling in arm and arm becoming more painful. Pt normally  wears oxygen  at night only. Home nurse has patient on 4L [place] continuously since yesterday.  EMS vs  110/56 130's HR  99. Temp   650mg  tylenol  given   HX of blood clots.  Pt not on any blood thinners.  Pt has ostomy.

## 2023-11-13 NOTE — Progress Notes (Signed)
 Orthopedic Tech Progress Note Patient Details:  [name] May 21, 1940 [id number]  Ortho Devices Type of Ortho Device: Shoulder immobilizer Ortho Device/Splint Location: LUE Ortho Device/Splint Interventions: Ordered, Application   Post Interventions Patient Tolerated: Well Instructions Provided: Care of device   Aaren Atallah L [name] 11/13/2023, 11:37 PM

## 2023-11-13 NOTE — Consult Note (Signed)
 ORTHOPAEDIC CONSULTATION  REQUESTING PHYSICIAN: Sundil, Subrina, MD  Chief Complaint: left shoulder pain  HPI: [name] is a 83 y.o. female who complains of worsening pain in the left shoulder for several days. Denies any injury or inciting event prior to the start of the pain. Started to notice swelling in the axilla and biceps area. Became difficult to move the shoulder. Denies n/t in hand. Endorses occasional pain in the shoulder for years due to RA. Recently had her RA medicine stopped.   Imaging shows  - Nondisplaced fracture of the posterosuperior glenoid. - Large loculated peripherally enhancing fluid collection extending from the left glenohumeral joint into the proximal and mid biceps musculature. This measures approximately 5.3 x 3.6 x 17.5 cm. - Findings are concerning for septic arthritis and intramuscular abscess. - Question developing humeral head osteomyelitis.    Orthopedics was consulted for evaluation.    No known history of MI, CVA, DVT, PE.  Previously ambulatory with assistive device. The patient is living at home.    Past Medical History:  Diagnosis Date   Acute encephalopathy 03/30/2021   11/12 - 04/03/2021 acute encephalopathy in the context of altered mental status and poor oral intake following increasing pain medicines and associated Klebsiella UTI.     Acute kidney injury superimposed on chronic kidney disease (HCC) 04/17/2015   11/12 - 04/03/2021 AKI with creatinine of 2.22 and GFR 22 indicating stage IV renal disease in the context of encephalopathy secondary to increased pain medications with decreased oral intake of fluids and nutrition.     Acute on chronic diastolic (congestive) heart failure (HCC)    Anemia    iron  deficiency anemia - secondary to blood loss ( chronic)    Anxiety    Arthritis    endstage changes bilateral knees/bilateral ankles.    Asthma    Carotid artery occlusion    Chest pain of uncertain etiology 01/28/2012    Normal coronaries at cath     Chronic fatigue    Chronic kidney disease    Closed intertrochanteric fracture of hip, left, initial encounter (HCC) 11/29/2016   Closed left hip fracture (HCC)    Closed nondisplaced intertrochanteric fracture of left femur with delayed healing 01/14/2017   Clotting disorder (HCC)    pt denies this   Contusion of left knee    due to fall 1/14.   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     Morbid obesity (HCC)    Neuromuscular disorder (HCC)    diabetic neuropathy    Normal coronary arteries    cardiac catheterization performed  10/31/14   Pneumonia    RA (rheumatoid arthritis) (HCC)    has been off methotreaxte since 10/13.   Septic shock (HCC) 06/13/2023   Spinal stenosis of lumbar region    Thyroid  disease    Unspecified lack of coordination  URI (upper respiratory infection)    UTI (urinary tract infection) 03/31/2021   11/12 - 04/03/2021 Klebsiella UTI associated with encephalopathy in the context of AKI.  Status post IV ceftriaxone  followed by oral Keflex  x48 hours.     Past Surgical History:  Procedure Laterality Date   BRAIN SURGERY     Gamma knife 10/13. Needs repeat spring  '14   CARDIAC CATHETERIZATION N/A 10/31/2014   Procedure: Right/Left Heart Cath and Coronary Angiography;  Surgeon: Candyce GORMAN Reek, MD;  Location: [facility] INVASIVE CV LAB;  Service: Cardiovascular;  Laterality: N/A;   COLONOSCOPY WITH PROPOFOL  N/A 04/14/2022    Procedure: COLONOSCOPY WITH PROPOFOL ;  Surgeon: Albertus Gordy HERO, MD;  Location: [facility] ENDOSCOPY;  Service: Gastroenterology;  Laterality: N/A;   CYST EXCISION     2 on face   CYST REMOVAL NECK     ESOPHAGOGASTRODUODENOSCOPY (EGD) WITH PROPOFOL  N/A 09/14/2014   Procedure: ESOPHAGOGASTRODUODENOSCOPY (EGD) WITH PROPOFOL ;  Surgeon: Lamar JONETTA Aho, MD;  Location: [facility] ENDOSCOPY;  Service: Endoscopy;  Laterality: N/A;   INCISION AND DRAINAGE HIP Left 01/16/2017   Procedure: IRRIGATION AND DEBRIDEMENT LEFT HIP;  Surgeon: Vernetta Lonni GRADE, MD;  Location: [facility] ORS;  Service: Orthopedics;  Laterality: Left;   INTRAMEDULLARY (IM) NAIL INTERTROCHANTERIC Left 11/29/2016   Procedure: INTRAMEDULLARY (IM) NAIL INTERTROCHANTRIC;  Surgeon: Vernetta Lonni GRADE, MD;  Location: [facility] OR;  Service: Orthopedics;  Laterality: Left;   LAPAROSCOPIC LOOP COLOSTOMY N/A 07/17/2023   Procedure: LAPAROSCOPIC DIVERTING LOOP COLOSTOMY;  Surgeon: Belinda Cough, MD;  Location: [facility] OR;  Service: General;  Laterality: N/A;   OVARY SURGERY     RIGHT HEART CATH N/A 08/12/2021   Procedure: RIGHT HEART CATH;  Surgeon: Court Dorn PARAS, MD;  Location: [facility] INVASIVE CV LAB;  Service: Cardiovascular;  Laterality: N/A;   SHOULDER SURGERY Left    TONSILLECTOMY  age 63   VAGINAL HYSTERECTOMY     VIDEO BRONCHOSCOPY Bilateral 05/31/2013   Procedure: VIDEO BRONCHOSCOPY WITHOUT FLUORO;  Surgeon: Dorethia Cave, MD;  Location: [facility] ENDOSCOPY;  Service: Cardiopulmonary;  Laterality: Bilateral;   video bronscoscopy  05/19/1998   lung   Social History   Socioeconomic History   Marital status: Married    Spouse name: Not on file   Number of children: 6   Years of education: college   Highest education level: Bachelor's degree (e.g., BA, AB, BS)  Occupational History   Occupation: retired Engineer, civil (consulting)  Tobacco Use   Smoking status: Never   Smokeless tobacco: Never  Vaping Use   Vaping status: Never Used  Substance and Sexual Activity    Alcohol  use: No   Drug use: No   Sexual activity: Not Currently  Other Topics Concern   Not on file  Social History Narrative   Patient consumes 2-3 cups coffee per day.   Social Drivers of Corporate investment banker Strain: Low Risk  (10/30/2023)   Overall Financial Resource Strain (CARDIA)    Difficulty of Paying Living Expenses: Not hard at all  Food Insecurity: No Food Insecurity (10/30/2023)   Hunger Vital Sign    Worried About Running Out of Food in the Last Year: Never true    Ran Out of Food in the Last Year: Never true  Transportation Needs: No Transportation Needs (10/30/2023)   PRAPARE - Administrator, Civil Service (Medical): No    Lack of Transportation (Non-Medical): No  Physical Activity: Insufficiently Active (10/30/2023)   Exercise Vital Sign    Days of Exercise per Week:  5 days    Minutes of Exercise per Session: 20 min  Stress: No Stress Concern Present (10/30/2023)   [employer] of Occupational Health - Occupational Stress Questionnaire    Feeling of Stress: Not at all  Social Connections: Socially Integrated (10/30/2023)   Social Connection and Isolation Panel    Frequency of Communication with Friends and Family: More than three times a week    Frequency of Social Gatherings with Friends and Family: More than three times a week    Attends Religious Services: More than 4 times per year    Active Member of [employer] or Organizations: Yes    Attends Engineer, structural: More than 4 times per year    Marital Status: Married   Family History  Problem Relation Age of Onset   Diabetes Mother    Heart attack Mother    Hypertension Father    Lung cancer Father    Diabetes Sister    Breast cancer Sister    Breast cancer Sister    Diabetes Brother    Hypertension Brother    Heart disease Brother    Heart attack Brother    Kidney cancer Brother    Gout Brother    Kidney failure Brother        x 5   Esophageal cancer Brother     Stomach cancer Brother    Prostate cancer Brother    Uterine cancer Daughter        with mets   Fibroids Daughter    Hypertension Daughter    Hypertension Daughter    Hypertension Son    Heart murmur Son    Rheum arthritis Maternal Uncle    Alzheimer's disease Neg Hx    Dementia Neg Hx    Allergies  Allergen Reactions   Codeine Swelling and Other (See Comments)    Facial and leg swelling Chest pain    Remicade [Infliximab] Anaphylaxis    sent me into shock    Zestril [Lisinopril] Swelling and Rash    Face and neck swelling    Ofev  [Nintedanib] Diarrhea   Prior to Admission medications   Medication Sig Start Date End Date Taking? Authorizing Provider  acetaminophen  (TYLENOL ) 500 MG tablet Take 1,000 mg by mouth every 8 (eight) hours as needed (pain).   Yes [provider]  albuterol  (VENTOLIN  HFA) 108 (90 Base) MCG/ACT inhaler Inhale 2 puffs into the lungs in the morning and at bedtime.   Yes [provider]  atorvastatin  (LIPITOR) 10 MG tablet Take 1 tablet (10 mg total) by mouth daily. 08/14/22  Yes Mercer Clotilda SAUNDERS, MD  azithromycin  (ZITHROMAX ) 250 MG tablet One tab m-wed -friday 08/05/23  Yes Angiulli, Toribio PARAS, PA-C  bisacodyl  (DULCOLAX) 10 MG suppository Place 10 mg rectally daily as needed (bowel management).   Yes [provider]  Cholecalciferol  (VITAMIN D3) 50 MCG (2000 UT) TABS Take 2,000 Units by mouth daily.   Yes [provider]  diclofenac  Sodium (VOLTAREN ) 1 % GEL Apply 2 g topically 3 (three) times daily. 08/04/23  Yes Angiulli, Toribio PARAS, PA-C  DULoxetine  (CYMBALTA ) 60 MG capsule Take 1 capsule (60 mg total) by mouth daily. 08/05/23  Yes Angiulli, Toribio PARAS, PA-C  furosemide  (LASIX ) 40 MG tablet Take 1 tablet (40 mg total) by mouth daily. 08/05/23  Yes Angiulli, Toribio PARAS, PA-C  gabapentin  (NEURONTIN ) 300 MG capsule Take 1 capsule (300 mg total) by mouth 2 (two) times daily. Patient taking differently: Take 300 mg by mouth  3  (three) times daily. 08/04/23  Yes Angiulli, Toribio PARAS, PA-C  guaiFENesin  (MUCINEX ) 600 MG 12 hr tablet Take 2 tablets (1,200 mg total) by mouth 2 (two) times daily. 08/04/23  Yes Angiulli, Toribio PARAS, PA-C  hydroxychloroquine  (PLAQUENIL ) 200 MG tablet Take 1 tablet (200 mg total) by mouth daily. 07/22/23  Yes Ghimire, Donalda HERO, MD  hyoscyamine  (LEVSIN  SL) 0.125 MG SL tablet Place 1 tablet (0.125 mg total) under the tongue every 8 (eight) hours as needed. 11/04/23  Yes Zehr, Jessica D, PA-C  leflunomide  (ARAVA ) 20 MG tablet Take 1 tablet (20 mg total) by mouth daily. 07/22/23 11/13/23 Yes Ghimire, Donalda HERO, MD  levothyroxine  (SYNTHROID ) 50 MCG tablet Take 1 tablet (50 mcg total) by mouth daily. 11/28/22  Yes Mercer Clotilda SAUNDERS, MD  lidocaine  (LIDODERM ) 5 % Place 1 patch onto the skin daily. Remove & Discard patch within 12 hours or as directed by MD 11/09/23  Yes Debby Fidela CROME, NP  methocarbamol  (ROBAXIN ) 500 MG tablet Take 1 tablet (500 mg total) by mouth every 6 (six) hours as needed for muscle spasms (pain). 08/04/23  Yes Angiulli, Toribio PARAS, PA-C  metoprolol  tartrate (LOPRESSOR ) 100 MG tablet Take 100 mg by mouth daily. Takes along with 25 mg to total 125 mg   Yes [provider]  metoprolol  tartrate (LOPRESSOR ) 25 MG tablet Take 25 mg by mouth daily. Takes along with 100 mg to total 125 mg   Yes [provider]  morphine  (MS CONTIN ) 15 MG 12 hr tablet Take 1 tablet (15 mg total) by mouth every 12 (twelve) hours. 08/14/23  Yes Rai, Ripudeep K, MD  Multiple Vitamin (MULTIVITAMIN) tablet Take 1 tablet by mouth daily.   Yes [provider]  OXYGEN  Inhale 3 L/min into the lungs at bedtime. As needed during day time   Yes [provider]  pantoprazole  (PROTONIX ) 40 MG tablet Take 1 tablet (40 mg total) by mouth 2 (two) times daily. 06/23/23  Yes Mercer Clotilda SAUNDERS, MD  potassium chloride  SA (KLOR-CON  M) 20 MEQ tablet Take 2 tablets (40 mEq total) by mouth 2 (two) times daily. Patient  taking differently: Take 20 mEq by mouth daily. 08/04/23  Yes Angiulli, Toribio PARAS, PA-C  predniSONE  (DELTASONE ) 10 MG tablet Take 1 tablet (10 mg total) by mouth daily with breakfast. 10/15/23  Yes Mercer Clotilda SAUNDERS, MD  leptospermum manuka honey (MEDIHONEY) PSTE paste Apply 1 Application topically daily. Interchangeable with TheraHoney Apply thin layer (3 mm) to wound. Patient not taking: No sig reported 08/15/23   Rai, Nydia POUR, MD  Magnesium  Oxide 250 MG TABS Take 1 tablet (250 mg total) by mouth daily. Patient not taking: Reported on 11/13/2023 08/14/23   Rai, Nydia POUR, MD  nitroGLYCERIN  (NITROSTAT ) 0.4 MG SL tablet Place 1 tablet (0.4 mg total) under the tongue every 5 (five) minutes as needed for chest pain. Patient not taking: Reported on 11/13/2023 08/04/23   Pegge Toribio PARAS, PA-C   CT ANGIO UP EXTREM LEFT W &/OR WO CONTAST Result Date: 11/13/2023 CLINICAL DATA:  Abnormal ultrasound, blood clot in arm EXAM: CT ANGIOGRAPHY OF LEFT UPPER EXTREMITY TECHNIQUE: Multidetector CT imaging of the LEFT UPPER EXTREMITY was performed using the standard protocol during bolus administration of intravenous contrast. Multiplanar CT image reconstructions and MIPs were obtained to evaluate the vascular anatomy. RADIATION DOSE REDUCTION: This exam was performed according to the departmental dose-optimization program which includes automated exposure control, adjustment of the mA and/or kV according to patient size and/or  use of iterative reconstruction technique. CONTRAST:  OMNIPAQUE  IOHEXOL  350 MG/ML SOLN COMPARISON:  None Available. FINDINGS: VASCULAR Subclavian artery: Patent where visualized. No acute luminal abnormality. No aneurysm or ectasia. Axillary artery: Widely patent. No acute luminal abnormality. No aneurysm or ectasia. Brachial artery: Widely patent. No acute luminal abnormality. No aneurysm or ectasia. Radial and ulnar arteries: Not well visualized Veins: Not well opacified. Nonvascular:  Nondisplaced fracture of the posterosuperior glenoid large loculated peripherally enhancing fluid collection extending from the left glenohumeral joint into the proximal and mid biceps musculature. This measures approximately 5.3 x 3.6 x 17.5 cm. Adjacent intramuscular and subcutaneous edema throughout the left arm. There is loss of cortical definition about the portions of the superior and posterior humeral head (circa series 8/image 82 and 6/37). IMPRESSION: 1. Nondisplaced fracture of the posterosuperior glenoid. 2. Large loculated peripherally enhancing fluid collection extending from the left glenohumeral joint into the proximal and mid biceps musculature. This measures approximately 5.3 x 3.6 x 17.5 cm. Findings are concerning for septic arthritis and intramuscular abscess. 3. Question developing humeral head osteomyelitis. 4. The radial and ulnar arteries are not well visualized. The subclavian, axillary, and brachial arteries are widely patent. Electronically Signed   By: Norman Gatlin M.D.   On: 11/13/2023 20:09   CT Angio Chest PE W and/or Wo Contrast Result Date: 11/13/2023 CLINICAL DATA:  Blood clot in arm, arm pain. Evaluate for pulmonary embolism. EXAM: CT ANGIOGRAPHY CHEST WITH CONTRAST TECHNIQUE: Multidetector CT imaging of the chest was performed using the standard protocol during bolus administration of intravenous contrast. Multiplanar CT image reconstructions and MIPs were obtained to evaluate the vascular anatomy. RADIATION DOSE REDUCTION: This exam was performed according to the departmental dose-optimization program which includes automated exposure control, adjustment of the mA and/or kV according to patient size and/or use of iterative reconstruction technique. CONTRAST:  OMNIPAQUE  IOHEXOL  350 MG/ML SOLN COMPARISON:  CT chest 08/13/2023 FINDINGS: Cardiovascular: Negative for pulmonary embolism. Coronary artery and aortic atherosclerotic calcification. Normal caliber thoracic  aorta. No pericardial effusion. Mediastinum/Nodes: Trachea and esophagus are unremarkable. No thoracic adenopathy. Lungs/Pleura: Patchy bilateral ground-glass opacities. Bronchial wall thickening and mucous plugging in the lower lobes. Left greater than right lower lobe atelectasis or pneumonia. Trace left pleural effusion. No pneumothorax. Upper Abdomen: No acute abnormality. Musculoskeletal: Suspected nondisplaced fracture of the superior left glenoid fossa (series 9/image 67). Peripherally enhancing left glenohumeral joint effusion. If there is concern for septic arthritis fluid sampling is recommended. Review of the MIP images confirms the above findings. IMPRESSION: 1. Negative for pulmonary embolism. 2. Patchy bilateral ground-glass opacities with bronchial wall thickening and mucous plugging in the lower lobes. Findings are concerning for pneumonia. 3. Nondisplaced fracture of the superior left glenoid fossa. Peripherally enhancing left glenohumeral joint effusion. Septic arthritis is not excluded. Consider joint aspiration. 4. Aortic Atherosclerosis (ICD10-I70.0). Electronically Signed   By: Norman Gatlin M.D.   On: 11/13/2023 19:57   UE Venous Duplex ([facility] and [facility] ONLY) Result Date: 11/13/2023 UPPER VENOUS STUDY  Patient Name:  TANISHI NAULT  Date of Exam:   11/13/2023 Medical Rec #: [id number]          Accession #:    [id number] Date of Birth: [birth year]          Patient Gender: F Patient Age:   37 years Exam Location:  [facility] Procedure:      VAS US  UPPER EXTREMITY VENOUS DUPLEX Referring Phys: ANDREW TEE --------------------------------------------------------------------------------  Indications: Pain, and Swelling Performing  Technologist: Ezzie Potters RVT, RDMS  Examination Guidelines: A complete evaluation includes B-mode imaging, spectral Doppler, color Doppler, and power Doppler as needed of all accessible portions of each vessel. Bilateral testing is considered an integral part of a  complete examination. Limited examinations for reoccurring indications may be performed as noted.  Left Findings: +----------+------------+---------+-----------+----------+---------------------+ LEFT      CompressiblePhasicitySpontaneousProperties       Summary        +----------+------------+---------+-----------+----------+---------------------+ IJV           Full       Yes       Yes                                    +----------+------------+---------+-----------+----------+---------------------+ Subclavian    Full       Yes       Yes                                    +----------+------------+---------+-----------+----------+---------------------+ Axillary      Full       Yes       Yes                                    +----------+------------+---------+-----------+----------+---------------------+ Brachial                 Yes       Yes               not well visualized                                                            patent by                                                               color/doppler     +----------+------------+---------+-----------+----------+---------------------+ Radial        Full                                                        +----------+------------+---------+-----------+----------+---------------------+ Ulnar         Full                                   not well visualized  +----------+------------+---------+-----------+----------+---------------------+ Cephalic      Full                                                        +----------+------------+---------+-----------+----------+---------------------+  Basilic       Full       Yes       Yes                                    +----------+------------+---------+-----------+----------+---------------------+ Large non-vascularized heterogenous structure seen in left upper arm (11.5 x 4.1 x 5.10).  Summary:  Right: No evidence of thrombosis  in the subclavian.  Left: No evidence of deep vein thrombosis in the upper extremity. No evidence of superficial vein thrombosis in the upper extremity. Large non-vascularized heterogenous structure seen in left upper arm (11.5 x 4.1 x 5.10).  *See table(s) above for measurements and observations.    Preliminary     Positive ROS: All other systems have been reviewed and were otherwise negative with the exception of those mentioned in the HPI and as above.  Objective: Labs cbc Recent Labs    11/13/23 1301  WBC 10.5  HGB 8.9*  HCT 30.7*  PLT 153    Labs inflam No results for input(s): CRP in the last 72 hours.  Invalid input(s): ESR  Labs coag No results for input(s): INR, PTT in the last 72 hours.  Invalid input(s): PT  Recent Labs    11/13/23 1301  NA 139  K 4.1  CL 100  CO2 28  GLUCOSE 152*  BUN 16  CREATININE 0.95  CALCIUM  8.0*    Physical Exam: Vitals:   11/13/23 2000 11/13/23 2129  BP: (!) 144/98   Pulse: (!) 108   Resp: 12   Temp:  98.4 F (36.9 C)  SpO2: 100%    General: Alert, no acute distress.  Sitting up on stretcher, calm, feels very cold so covered in several blankets Mental status: Alert and Oriented x3 Neurologic: Speech Clear and organized, no gross focal findings or movement disorder appreciated. Respiratory: No cyanosis, no use of accessory musculature Cardiovascular: No pedal edema GI: Abdomen is soft and non-tender, non-distended. Skin: Warm and dry.  No lesions in the area of chief complaint. Extremities: Warm and well perfused w/o edema Psychiatric: Patient is competent for consent with normal mood and affect  MUSCULOSKELETAL:  LUE is TTP globally around shoulder and into axilla and biceps muscle belly. Minimal if any ROM tolerated at shoulder and elbow due to pain. Able to grip my fingers with the L hand. Diffuse edema throughout the LUE from the shoulder through the fingers. Slight skin discoloration seen near biceps muscle  belly region.   Other extremities are atraumatic with painless ROM and NVI.  Assessment / Plan: Principal Problem:   Septic arthritis (HCC) Active Problems:   Rheumatoid arthritis (HCC)   Debility   Chronic hypoxic respiratory failure (HCC)   Depression   Insulin  dependent type 2 diabetes mellitus (HCC)   Chronic chest pain   Chronic pain syndrome   Diabetic neuropathy (HCC)   Sinus tachycardia   History of pulmonary embolus (PE)   Hypothyroidism   Hyperlipidemia   Chronic diastolic CHF (congestive heart failure) (HCC)   GAD (generalized anxiety disorder)   SIRS (systemic inflammatory response syndrome) (HCC)   CAP (community acquired pneumonia)   Elevated troponin   History of GI bleed   History of CAD (coronary artery disease)   Stage IV pressure ulcer of sacral region (HCC)   GERD (gastroesophageal reflux disease)   Chronic constipation   Recommend IR consult for left glenohumeral joint aspiration and intramuscular abscess  aspiration. Send to lab and monitor specimen to determine next steps and if surgery is needed.    Weightbearing: WBAT LUE Orthopedic device(s): sling for comfort VTE prophylaxis: heparin   Pain control: PRN Contact information:  Evalene Chancy MD, [facility] PA-C  Gerard CHRISTELLA Large PA-C Office [phone] 11/13/2023 11:23 PM

## 2023-11-13 NOTE — ED Notes (Signed)
 Ortho tech at bedside

## 2023-11-13 NOTE — Progress Notes (Signed)
 LUE venous duplex has been completed.  Preliminary results given to Dr. Corinthia.   Results can be found under chart review under CV PROC. 11/13/2023 5:14 PM Fabiha Rougeau RVT, RDMS

## 2023-11-13 NOTE — ED Provider Notes (Signed)
 [name]   CSN: [id number] Arrival date & time: 11/13/23  1248     Patient presents with: blood clot in arm  and Arm Pain   [name] is a 83 y.o. female.   83 year old female with past medical history of diabetes, hypertension, and coronary artery disease presenting to the emergency department today with pain in her left arm.  The patient states has been going now for the past few days.  Is gradually worsened.  She reports the pain radiates all the way down her left arm.  She states that she has been having some left-sided chest pain as well.  She called her doctor and was sent in for further evaluation.  She states the pain is worse with movement or if she touches the area.  Denies any associated fevers or cough.   Arm Pain Associated symptoms include chest pain.       Prior to Admission medications   Medication Sig Start Date End Date Taking? Authorizing Provider  acetaminophen  (TYLENOL ) 500 MG tablet Take 1,000 mg by mouth every 8 (eight) hours as needed (pain).    [provider]  albuterol  (VENTOLIN  HFA) 108 (90 Base) MCG/ACT inhaler Inhale 2 puffs into the lungs in the morning and at bedtime.    [provider]  atorvastatin  (LIPITOR) 10 MG tablet Take 1 tablet (10 mg total) by mouth daily. 08/14/22   Mercer Clotilda SAUNDERS, MD  azithromycin  (ZITHROMAX ) 250 MG tablet One tab m-wed -friday 08/05/23   Angiulli, Toribio PARAS, PA-C  bisacodyl  (DULCOLAX) 10 MG suppository Place 10 mg rectally daily as needed (bowel management).    [provider]  Cholecalciferol  (VITAMIN D3) 50 MCG (2000 UT) TABS Take 2,000 Units by mouth daily.    [provider]  diclofenac  Sodium (VOLTAREN ) 1 % GEL Apply 2 g topically 3 (three) times daily. 08/04/23   Angiulli, Toribio PARAS, PA-C  DULoxetine  (CYMBALTA ) 60 MG capsule Take 1 capsule (60 mg total) by mouth daily. 08/05/23   Angiulli, Toribio PARAS, PA-C  furosemide   (LASIX ) 40 MG tablet Take 1 tablet (40 mg total) by mouth daily. 08/05/23   Angiulli, Toribio PARAS, PA-C  gabapentin  (NEURONTIN ) 300 MG capsule Take 1 capsule (300 mg total) by mouth 2 (two) times daily. 08/04/23   Angiulli, Toribio PARAS, PA-C  guaiFENesin  (MUCINEX ) 600 MG 12 hr tablet Take 2 tablets (1,200 mg total) by mouth 2 (two) times daily. 08/04/23   Angiulli, Toribio PARAS, PA-C  hydroxychloroquine  (PLAQUENIL ) 200 MG tablet Take 1 tablet (200 mg total) by mouth daily. 07/22/23   Ghimire, Donalda HERO, MD  hyoscyamine  (LEVSIN  SL) 0.125 MG SL tablet Place 1 tablet (0.125 mg total) under the tongue every 8 (eight) hours as needed. 11/04/23   Zehr, Jessica D, PA-C  leflunomide  (ARAVA ) 20 MG tablet Take 1 tablet (20 mg total) by mouth daily. 07/22/23 11/09/23  Ghimire, Donalda HERO, MD  leptospermum manuka honey (MEDIHONEY) PSTE paste Apply 1 Application topically daily. Interchangeable with TheraHoney Apply thin layer (3 mm) to wound. Patient not taking: Reported on 11/09/2023 08/15/23   Davia Nydia POUR, MD  levothyroxine  (SYNTHROID ) 50 MCG tablet Take 1 tablet (50 mcg total) by mouth daily. 11/28/22   Mercer Clotilda SAUNDERS, MD  lidocaine  (LIDODERM ) 5 % Place 1 patch onto the skin daily. Remove & Discard patch within 12 hours or as directed by MD 11/09/23   Debby Fidela CROME, NP  magnesium  hydroxide (MILK OF MAGNESIA)  400 MG/5ML suspension Take 30 mLs by mouth daily as needed (no BM in 3 days).    [provider]  Magnesium  Oxide 250 MG TABS Take 1 tablet (250 mg total) by mouth daily. 08/14/23   Rai, Nydia POUR, MD  methocarbamol  (ROBAXIN ) 500 MG tablet Take 1 tablet (500 mg total) by mouth every 6 (six) hours as needed for muscle spasms (pain). 08/04/23   Angiulli, Toribio PARAS, PA-C  METOPROLOL  TARTRATE PO Take 62.5 mg by mouth every 12 (twelve) hours.    [provider]  morphine  (MS CONTIN ) 15 MG 12 hr tablet Take 1 tablet (15 mg total) by mouth every 12 (twelve) hours. 08/14/23   Rai, Nydia POUR, MD  Multiple Vitamin  (MULTIVITAMIN) tablet Take 1 tablet by mouth daily.    [provider]  nitroGLYCERIN  (NITROSTAT ) 0.4 MG SL tablet Place 1 tablet (0.4 mg total) under the tongue every 5 (five) minutes as needed for chest pain. 08/04/23   Angiulli, Toribio PARAS, PA-C  OXYGEN  Inhale 3 L/min into the lungs at bedtime.    [provider]  pantoprazole  (PROTONIX ) 40 MG tablet Take 1 tablet (40 mg total) by mouth 2 (two) times daily. 06/23/23   Mercer Clotilda SAUNDERS, MD  polyethylene glycol powder (GLYCOLAX /MIRALAX ) 17 GM/SCOOP powder Take 17 g by mouth daily as needed (bowel management).    [provider]  potassium chloride  SA (KLOR-CON  M) 20 MEQ tablet Take 2 tablets (40 mEq total) by mouth 2 (two) times daily. 08/04/23   Angiulli, Toribio PARAS, PA-C  predniSONE  (DELTASONE ) 10 MG tablet Take 1 tablet (10 mg total) by mouth daily with breakfast. 10/15/23   Mercer Clotilda SAUNDERS, MD  senna (SENOKOT) 8.6 MG TABS tablet Take 17.2 mg by mouth daily as needed (bowel management).    [provider]  Sodium Phosphates  (ENEMA) ENEM Place 1 enema rectally daily as needed (constipation not relieved by bisacodyl  suppository). Patient not taking: Reported on 11/09/2023    [provider]  ZINC  OXIDE EX Apply 1 application  topically See admin instructions. Apply topically to sacral area twice daily with morning/night shift and as needed with each incontinent episode.    [provider]    Allergies: Codeine, Remicade [infliximab], Zestril [lisinopril], and Ofev  [nintedanib]    Review of Systems  Cardiovascular:  Positive for chest pain.  Musculoskeletal:        Left arm pain  All other systems reviewed and are negative.   Updated Vital Signs BP 114/75   Pulse (!) 114   Temp 99.1 F (37.3 C) (Oral)   Resp 12   Ht 5' 4 (1.626 m)   Wt 90.3 kg   SpO2 100%   BMI 34.16 kg/m   Physical Exam Vitals and nursing Mcdaniel reviewed.   Gen: Appears uncomfortable Eyes: PERRL, EOMI HEENT: no  oropharyngeal swelling Neck: trachea midline Resp: clear to auscultation bilaterally Card: Tachycardic, no murmurs, rubs, or gallops Abd: nontender, nondistended Extremities: no calf tenderness, no edema Vascular: 2+ radial pulses bilaterally, 2+ DP pulses bilaterally Neuro: No focal deficits Skin: no rashes Psyc: acting appropriately   (all labs ordered are listed, but only abnormal results are displayed) Labs Reviewed  BASIC METABOLIC PANEL WITH GFR - Abnormal; Notable for the following components:      Result Value   Glucose, Bld 152 (*)    Calcium  8.0 (*)    All other components within normal limits  CBC - Abnormal; Notable for the following components:   RBC  3.11 (*)    Hemoglobin 8.9 (*)    HCT 30.7 (*)    MCHC 29.0 (*)    RDW 15.8 (*)    All other components within normal limits  TROPONIN I (HIGH SENSITIVITY) - Abnormal; Notable for the following components:   Troponin I (High Sensitivity) 23 (*)    All other components within normal limits  TROPONIN I (HIGH SENSITIVITY) - Abnormal; Notable for the following components:   Troponin I (High Sensitivity) 21 (*)    All other components within normal limits    EKG: None  Radiology: No results found.   Procedures   Medications Ordered in the ED  lactated ringers  bolus 1,000 mL (has no administration in time range)  morphine  (PF) 4 MG/ML injection 4 mg (4 mg Intravenous Given 11/13/23 1457)  ondansetron  (ZOFRAN ) injection 4 mg (4 mg Intravenous Given 11/13/23 1456)  lactated ringers  bolus 1,000 mL (1,000 mLs Intravenous New Bag/Given 11/13/23 1500)                                    Medical Decision Making 83 year old female with past medical history of diabetes, hypertension, and coronary artery disease presenting to the emergency department today with chest pain left arm pain as well as an tachycardia.  Will further evaluate her here with basic labs as well as an ultrasound of her left upper extremity to evaluate  for DVT and CT angiogram to evaluate for pulmonary embolism given the significant tachycardia.  Will also obtain EKG and troponin to evaluate for ACS.  I will give the patient IV fluids here as well as morphine  and Zofran .  Will reevaluate for ultimate disposition.  The patient's labs are relatively reassuring.  She does have relatively stable anemia here.  Initial troponin was mildly elevated.  Will obtain a second troponin.  CT scan and ultrasound are pending at the time of signout.  Plan is for reassessment after imaging studies and workup is complete.  Amount and/or Complexity of Data Reviewed Labs: ordered. Radiology: ordered.  Risk Prescription drug management.        Final diagnoses:  Left arm pain  Tachycardia    ED Discharge Orders     None          Ula Prentice SAUNDERS, MD 11/13/23 [phone]

## 2023-11-13 NOTE — H&P (Addendum)
 History and Physical    [name] FMW:[id number] DOB: September 07, 1940 DOA: 11/13/2023  PCP: Mercer Clotilda SAUNDERS, MD   Patient coming from: Home   Chief Complaint:  Chief Complaint  Patient presents with   blood clot in arm    Arm Pain   ED TRIAGE note:Pt bib gcems from home. Pt reports that she has had increased swelling in arm and arm becoming more painful. Pt normally  wears oxygen  at night only. Home nurse has patient on 4L [place] continuously since yesterday.   EMS vs  110/56 130's HR  99. Temp    650mg  tylenol  given    HX of blood clots.  Pt not on any blood thinners.  Pt has ostomy.   HPI:  [name] is a 83 y.o. female with medical history significant of GI bleed secondary to sigmoid stricture s/p  colectomy with colostomy creation in February 2025, history of PE not on anticoagulation due to history of GI bleed, nonobstructive CAD, atypical chest pain, hypothyroidism, essential hypertension, dyslipidemia, rheumatoid arthritis, chronic sinus tachycardia, diastolic heart failure, insulin -dependent DM type II, COPD, chronic hypoxic respiratory failure 3 to 4 L oxygen  at baseline, sacral stage II pressure ulcer, left thigh pressure ulcer, morbid obesity, and a chronic physical deconditioning presented to emergency department complaining of left-sided upper extremity pain and swelling for last few days which has been progressively getting worse.  Reported left-sided upper arm pain radiating to forearm. Patient also complaining about left-sided chest pain as well.  Denies any fever, chills, abdominal pain nausea, vomiting and diarrhea.   ED Course:  At presentation to ED patient found to be tachycardic heart rate 123, tachypneic 22 borderline hypotensive and O2 sat 100% 4 L oxygen . Flat troponin 21 and 23. CBC showing no evidence of leukocytosis, stable H&H 8.9 and normal platelet count. BMP unremarkable. EKG shows sinus tachycardia heart rate 128 and abnormal R  progression/  Due to tachycardia CTA chest has been obtained which ruled out pulmonary embolism.  However CT chest showed concern for pneumonia.Nondisplaced fracture of the superior left glenoid fossa. Peripherally enhancing left glenohumeral joint effusion. Septic arthritis is not excluded. Consider joint aspiration. 4. Aortic Atherosclerosis (ICD10-I70.0).  Left upper extremity ultrasound did not showed any superficial or deep venous thrombosis however it showed nonvisualized heterogenous structure of the left upper arm (11.5 x 4.1 x 5.10).    CT angio left upper extremity: 1. Nondisplaced fracture of the posterosuperior glenoid. 2. Large loculated peripherally enhancing fluid collection extending from the left glenohumeral joint into the proximal and mid biceps musculature. This measures approximately 5.3 x 3.6 x 17.5 cm. Findings are concerning for septic arthritis and intramuscular abscess. 3. Question developing humeral head osteomyelitis. 4. The radial and ulnar arteries are not well visualized. The subclavian, axillary, and brachial arteries are widely patent.  ED physician consulted orthopedics who recommended admit under medicine service and recommended to consult IR tomorrow to tap the joint fluid of the left upper extremity for further evaluation.  Per orthopedics does not feel strongly about any IV antibiotic at this time given patient white blood cell count stable and afebrile.  Dr. Beverley will see the patient tomorrow for consult  In the ED patient has been given 2 L of LR bolus, morphine  4 mg and Zofran  4 mg.  Even the patient received 2 L of LR boluses still tachycardic.  Hospitalist has been consulted for further evaluation management of left upper extremity osteomyelitis, concern for septic arthritis of the glenohumeral  joint, intramuscular abscess and community-acquired  pneumonia.  Significant labs in the ED: Lab Orders         Culture, blood (Routine X 2) w Reflex  to ID Panel         Respiratory (~20 pathogens) panel by PCR         Expectorated Sputum Assessment w Gram Stain, Rflx to Resp Cult         Body fluid culture w Gram Stain         Aerobic/Anaerobic Culture w Gram Stain (surgical/deep wound)         Basic metabolic panel         CBC         Legionella Pneumophila Serogp 1 Ur Ag         Strep pneumoniae urinary antigen         Procalcitonin         Comprehensive metabolic panel         CBC         Body fluid cell count with differential         Sedimentation rate         C-reactive protein       Review of Systems:  Review of Systems  Constitutional:  Negative for chills, fever, malaise/fatigue and weight loss.  Respiratory:  Negative for cough, sputum production, shortness of breath and wheezing.   Cardiovascular:  Negative for chest pain, palpitations and leg swelling.  Gastrointestinal:  Negative for abdominal pain, diarrhea, heartburn, nausea and vomiting.  Musculoskeletal:  Positive for joint pain. Negative for back pain, falls, myalgias and neck pain.       Left-sided arm and forearm pain  Neurological:  Negative for dizziness, tingling, tremors and headaches.  Psychiatric/Behavioral:  The patient is not nervous/anxious.     Past Medical History:  Diagnosis Date   Acute encephalopathy 03/30/2021   11/12 - 04/03/2021 acute encephalopathy in the context of altered mental status and poor oral intake following increasing pain medicines and associated Klebsiella UTI.     Acute kidney injury superimposed on chronic kidney disease (HCC) 04/17/2015   11/12 - 04/03/2021 AKI with creatinine of 2.22 and GFR 22 indicating stage IV renal disease in the context of encephalopathy secondary to increased pain medications with decreased oral intake of fluids and nutrition.     Acute on chronic diastolic (congestive) heart failure (HCC)    Anemia    iron  deficiency anemia - secondary to blood loss ( chronic)    Anxiety    Arthritis     endstage changes bilateral knees/bilateral ankles.    Asthma    Carotid artery occlusion    Chest pain of uncertain etiology 01/28/2012   Normal coronaries at cath     Chronic fatigue    Chronic kidney disease    Closed intertrochanteric fracture of hip, left, initial encounter (HCC) 11/29/2016   Closed left hip fracture (HCC)    Closed nondisplaced intertrochanteric fracture of left femur with delayed healing 01/14/2017   Clotting disorder (HCC)    pt denies this   Contusion of left knee    due to fall 1/14.   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     Morbid obesity (HCC)    Neuromuscular disorder (HCC)    diabetic neuropathy    Normal coronary arteries    cardiac catheterization performed  10/31/14   Pneumonia    RA (rheumatoid arthritis) (HCC)    has been off methotreaxte since 10/13.   Septic shock (HCC) 06/13/2023   Spinal stenosis of lumbar region    Thyroid  disease    Unspecified lack of coordination    URI (upper respiratory infection)    UTI (urinary tract infection) 03/31/2021   11/12 - 04/03/2021 Klebsiella UTI associated with encephalopathy in the context of AKI.  Status post IV ceftriaxone  followed by oral Keflex  x48 hours.      Past Surgical History:  Procedure Laterality Date   BRAIN SURGERY     Gamma knife 10/13. Needs repeat spring  '14   CARDIAC CATHETERIZATION N/A 10/31/2014   Procedure: Right/Left Heart Cath and Coronary Angiography;  Surgeon:  Candyce GORMAN Reek, MD;  Location: [facility] INVASIVE CV LAB;  Service: Cardiovascular;  Laterality: N/A;   COLONOSCOPY WITH PROPOFOL  N/A 04/14/2022   Procedure: COLONOSCOPY WITH PROPOFOL ;  Surgeon: Albertus Gordy HERO, MD;  Location: [facility] ENDOSCOPY;  Service: Gastroenterology;  Laterality: N/A;   CYST EXCISION     2 on face   CYST REMOVAL NECK     ESOPHAGOGASTRODUODENOSCOPY (EGD) WITH PROPOFOL  N/A 09/14/2014   Procedure: ESOPHAGOGASTRODUODENOSCOPY (EGD) WITH PROPOFOL ;  Surgeon: Lamar JONETTA Aho, MD;  Location: [facility] ENDOSCOPY;  Service: Endoscopy;  Laterality: N/A;   INCISION AND DRAINAGE HIP Left 01/16/2017   Procedure: IRRIGATION AND DEBRIDEMENT LEFT HIP;  Surgeon: Vernetta Lonni GRADE, MD;  Location: [facility] ORS;  Service: Orthopedics;  Laterality: Left;   INTRAMEDULLARY (IM) NAIL INTERTROCHANTERIC Left 11/29/2016   Procedure: INTRAMEDULLARY (IM) NAIL INTERTROCHANTRIC;  Surgeon: Vernetta Lonni GRADE, MD;  Location: [facility] OR;  Service: Orthopedics;  Laterality: Left;   LAPAROSCOPIC LOOP COLOSTOMY N/A 07/17/2023   Procedure: LAPAROSCOPIC DIVERTING LOOP COLOSTOMY;  Surgeon: Belinda Cough, MD;  Location: [facility] OR;  Service: General;  Laterality: N/A;   OVARY SURGERY     RIGHT HEART CATH N/A 08/12/2021   Procedure: RIGHT HEART CATH;  Surgeon: Court Dorn PARAS, MD;  Location: [facility] INVASIVE CV LAB;  Service: Cardiovascular;  Laterality: N/A;   SHOULDER SURGERY Left    TONSILLECTOMY  age 28   VAGINAL HYSTERECTOMY     VIDEO BRONCHOSCOPY Bilateral 05/31/2013   Procedure: VIDEO BRONCHOSCOPY WITHOUT FLUORO;  Surgeon: Dorethia Cave, MD;  Location: [facility] END[facility]COPY;  Service: Cardiopulmonary;  Laterality: Bilateral;   video bronscoscopy  05/19/1998   lung     reports that she has never smoked. She has never used smokeless tobacco. She reports that she does not drink alcohol  and does not use drugs.  Allergies  Allergen Reactions   Codeine Swelling and Other (See Comments)    Facial and leg swelling Chest pain     Remicade [Infliximab] Anaphylaxis    sent me into shock    Zestril [Lisinopril] Swelling and Rash    Face and neck swelling    Ofev  [Nintedanib] Diarrhea    Family History  Problem Relation Age of Onset   Diabetes Mother  Heart attack Mother    Hypertension Father    Lung cancer Father    Diabetes Sister    Breast cancer Sister    Breast cancer Sister    Diabetes Brother    Hypertension Brother    Heart disease Brother    Heart attack Brother    Kidney cancer Brother    Gout Brother    Kidney failure Brother        x 5   Esophageal cancer Brother    Stomach cancer Brother    Prostate cancer Brother    Uterine cancer Daughter        with mets   Fibroids Daughter    Hypertension Daughter    Hypertension Daughter    Hypertension Son    Heart murmur Son    Rheum arthritis Maternal Uncle    Alzheimer's disease Neg Hx    Dementia Neg Hx     Prior to Admission medications   Medication Sig Start Date End Date Taking? Authorizing Provider  acetaminophen  (TYLENOL ) 500 MG tablet Take 1,000 mg by mouth every 8 (eight) hours as needed (pain).    [provider]  albuterol  (VENTOLIN  HFA) 108 (90 Base) MCG/ACT inhaler Inhale 2 puffs into the lungs in the morning and at bedtime.    [provider]  atorvastatin  (LIPITOR) 10 MG tablet Take 1 tablet (10 mg total) by mouth daily. 08/14/22   Mercer Clotilda SAUNDERS, MD  azithromycin  (ZITHROMAX ) 250 MG tablet One tab m-wed -friday 08/05/23   Angiulli, Toribio PARAS, PA-C  bisacodyl  (DULCOLAX) 10 MG suppository Place 10 mg rectally daily as needed (bowel management).    [provider]  Cholecalciferol  (VITAMIN D3) 50 MCG (2000 UT) TABS Take 2,000 Units by mouth daily.    [provider]  diclofenac  Sodium (VOLTAREN ) 1 % GEL Apply 2 g topically 3 (three) times daily. 08/04/23   Angiulli, Toribio PARAS, PA-C  DULoxetine  (CYMBALTA ) 60 MG capsule Take 1 capsule (60 mg total) by mouth daily. 08/05/23   Angiulli, Toribio PARAS,  PA-C  furosemide  (LASIX ) 40 MG tablet Take 1 tablet (40 mg total) by mouth daily. 08/05/23   Angiulli, Toribio PARAS, PA-C  gabapentin  (NEURONTIN ) 300 MG capsule Take 1 capsule (300 mg total) by mouth 2 (two) times daily. 08/04/23   Angiulli, Toribio PARAS, PA-C  guaiFENesin  (MUCINEX ) 600 MG 12 hr tablet Take 2 tablets (1,200 mg total) by mouth 2 (two) times daily. 08/04/23   Angiulli, Toribio PARAS, PA-C  hydroxychloroquine  (PLAQUENIL ) 200 MG tablet Take 1 tablet (200 mg total) by mouth daily. 07/22/23   Ghimire, Donalda HERO, MD  hyoscyamine  (LEVSIN  SL) 0.125 MG SL tablet Place 1 tablet (0.125 mg total) under the tongue every 8 (eight) hours as needed. 11/04/23   Zehr, Jessica D, PA-C  leflunomide  (ARAVA ) 20 MG tablet Take 1 tablet (20 mg total) by mouth daily. 07/22/23 11/09/23  Ghimire, Donalda HERO, MD  leptospermum manuka honey (MEDIHONEY) PSTE paste Apply 1 Application topically daily. Interchangeable with TheraHoney Apply thin layer (3 mm) to wound. Patient not taking: Reported on 11/09/2023 08/15/23   Rai, Nydia POUR, MD  levothyroxine  (SYNTHROID ) 50 MCG tablet Take 1 tablet (50 mcg total) by mouth daily. 11/28/22   Mercer Clotilda SAUNDERS, MD  lidocaine  (LIDODERM ) 5 % Place 1 patch onto the skin daily. Remove & Discard patch within 12 hours or as directed by MD 11/09/23   Debby Fidela CROME, NP  magnesium  hydroxide (MILK OF MAGNESIA) 400 MG/5ML suspension Take 30 mLs  by mouth daily as needed (no BM in 3 days).    [provider]  Magnesium  Oxide 250 MG TABS Take 1 tablet (250 mg total) by mouth daily. 08/14/23   Rai, Nydia POUR, MD  methocarbamol  (ROBAXIN ) 500 MG tablet Take 1 tablet (500 mg total) by mouth every 6 (six) hours as needed for muscle spasms (pain). 08/04/23   Angiulli, Toribio PARAS, PA-C  METOPROLOL  TARTRATE PO Take 62.5 mg by mouth every 12 (twelve) hours.    [provider]  morphine  (MS CONTIN ) 15 MG 12 hr tablet Take 1 tablet (15 mg total) by mouth every 12 (twelve) hours. 08/14/23   Rai, Nydia POUR, MD   Multiple Vitamin (MULTIVITAMIN) tablet Take 1 tablet by mouth daily.    [provider]  nitroGLYCERIN  (NITROSTAT ) 0.4 MG SL tablet Place 1 tablet (0.4 mg total) under the tongue every 5 (five) minutes as needed for chest pain. 08/04/23   Angiulli, Toribio PARAS, PA-C  OXYGEN  Inhale 3 L/min into the lungs at bedtime.    [provider]  pantoprazole  (PROTONIX ) 40 MG tablet Take 1 tablet (40 mg total) by mouth 2 (two) times daily. 06/23/23   Mercer Clotilda SAUNDERS, MD  polyethylene glycol powder (GLYCOLAX /MIRALAX ) 17 GM/SCOOP powder Take 17 g by mouth daily as needed (bowel management).    [provider]  potassium chloride  SA (KLOR-CON  M) 20 MEQ tablet Take 2 tablets (40 mEq total) by mouth 2 (two) times daily. 08/04/23   Angiulli, Toribio PARAS, PA-C  predniSONE  (DELTASONE ) 10 MG tablet Take 1 tablet (10 mg total) by mouth daily with breakfast. 10/15/23   Mercer Clotilda SAUNDERS, MD  senna (SENOKOT) 8.6 MG TABS tablet Take 17.2 mg by mouth daily as needed (bowel management).    [provider]  Sodium Phosphates  (ENEMA) ENEM Place 1 enema rectally daily as needed (constipation not relieved by bisacodyl  suppository). Patient not taking: Reported on 11/09/2023    [provider]  ZINC  OXIDE EX Apply 1 application  topically See admin instructions. Apply topically to sacral area twice daily with morning/night shift and as needed with each incontinent episode.    [provider]     Physical Exam: Vitals:   11/13/23 1708 11/13/23 1800 11/13/23 2000 11/13/23 2129  BP:  117/76 (!) 144/98   Pulse:  (!) 112 (!) 108   Resp:  16 12   Temp: 98.4 F (36.9 C)   98.4 F (36.9 C)  TempSrc: Oral   Oral  SpO2:  100% 100%   Weight:      Height:        Physical Exam Vitals reviewed.  Constitutional:      Appearance: She is obese. She is ill-appearing.  HENT:     Mouth/Throat:     Mouth: Mucous membranes are moist.   Eyes:     Pupils: Pupils are equal, round, and  reactive to light.    Cardiovascular:     Rate and Rhythm: Regular rhythm. Tachycardia present.     Pulses: Normal pulses.     Heart sounds: Normal heart sounds.     Comments: Left-sided upper chest wall reproducible pain Pulmonary:     Effort: Pulmonary effort is normal.     Breath sounds: Normal breath sounds. No wheezing.  Abdominal:     Palpations: Abdomen is soft.     Tenderness: There is no abdominal tenderness.     Hernia: No hernia is present.   Musculoskeletal:  General: Swelling and tenderness present.     Cervical back: Neck supple.     Comments: Left-sided upper extremity pain and swelling.  Limited joint movement of the shoulder secondary to pain   Skin:    General: Skin is warm.     Capillary Refill: Capillary refill takes less than 2 seconds.   Neurological:     Mental Status: She is alert and oriented to person, place, and time.   Psychiatric:        Mood and Affect: Mood normal.        Thought Content: Thought content normal.      Labs on Admission: I have personally reviewed following labs and imaging studies  CBC: Recent Labs  Lab 11/13/23 1301  WBC 10.5  HGB 8.9*  HCT 30.7*  MCV 98.7  PLT 153   Basic Metabolic Panel: Recent Labs  Lab 11/13/23 1301  NA 139  K 4.1  CL 100  CO2 28  GLUCOSE 152*  BUN 16  CREATININE 0.95  CALCIUM  8.0*   GFR: Estimated Creatinine Clearance: 50.5 mL/min (by C-G formula based on SCr of 0.95 mg/dL). Liver Function Tests: No results for input(s): AST, ALT, ALKPHOS, BILITOT, PROT, ALBUMIN  in the last 168 hours. No results for input(s): LIPASE, AMYLASE in the last 168 hours. No results for input(s): AMMONIA in the last 168 hours. Coagulation Profile: No results for input(s): INR, PROTIME in the last 168 hours. Cardiac Enzymes: Recent Labs  Lab 11/13/23 1301 11/13/23 1558  TROPONINIHS 23* 21*   BNP (last 3 results) Recent Labs    06/20/23 0531 06/21/23 0843 07/01/23 0534   BNP 27.4 16.4 74.1   HbA1C: No results for input(s): HGBA1C in the last 72 hours. CBG: No results for input(s): GLUCAP in the last 168 hours. Lipid Profile: No results for input(s): CHOL, HDL, LDLCALC, TRIG, CHOLHDL, LDLDIRECT in the last 72 hours. Thyroid  Function Tests: No results for input(s): TSH, T4TOTAL, FREET4, T3FREE, THYROIDAB in the last 72 hours. Anemia Panel: No results for input(s): VITAMINB12, FOLATE, FERRITIN, TIBC, IRON , RETICCTPCT in the last 72 hours. Urine analysis:    Component Value Date/Time   COLORURINE YELLOW 07/15/2023 1616   APPEARANCEUR HAZY (A) 07/15/2023 1616   LABSPEC >1.046 (H) 07/15/2023 1616   PHURINE 5.0 07/15/2023 1616   GLUCOSEU NEGATIVE 07/15/2023 1616   HGBUR NEGATIVE 07/15/2023 1616   BILIRUBINUR NEGATIVE 07/15/2023 1616   BILIRUBINUR 1+ 01/30/2021 1450   KETONESUR 5 (A) 07/15/2023 1616   PROTEINUR NEGATIVE 07/15/2023 1616   UROBILINOGEN negative (A) 01/30/2021 1450   UROBILINOGEN 0.2 11/28/2019 1424   NITRITE NEGATIVE 07/15/2023 1616   LEUKOCYTESUR NEGATIVE 07/15/2023 1616    Radiological Exams on Admission: I have personally reviewed images CT ANGIO UP EXTREM LEFT W &/OR WO CONTAST Result Date: 11/13/2023 CLINICAL DATA:  Abnormal ultrasound, blood clot in arm EXAM: CT ANGIOGRAPHY OF LEFT UPPER EXTREMITY TECHNIQUE: Multidetector CT imaging of the LEFT UPPER EXTREMITY was performed using the standard protocol during bolus administration of intravenous contrast. Multiplanar CT image reconstructions and MIPs were obtained to evaluate the vascular anatomy. RADIATION DOSE REDUCTION: This exam was performed according to the departmental dose-optimization program which includes automated exposure control, adjustment of the mA and/or kV according to patient size and/or use of iterative reconstruction technique. CONTRAST:  OMNIPAQUE  IOHEXOL  350 MG/ML SOLN COMPARISON:  None Available. FINDINGS: VASCULAR  Subclavian artery: Patent where visualized. No acute luminal abnormality. No aneurysm or ectasia. Axillary artery: Widely patent. No acute luminal abnormality. No  aneurysm or ectasia. Brachial artery: Widely patent. No acute luminal abnormality. No aneurysm or ectasia. Radial and ulnar arteries: Not well visualized Veins: Not well opacified. Nonvascular: Nondisplaced fracture of the posterosuperior glenoid large loculated peripherally enhancing fluid collection extending from the left glenohumeral joint into the proximal and mid biceps musculature. This measures approximately 5.3 x 3.6 x 17.5 cm. Adjacent intramuscular and subcutaneous edema throughout the left arm. There is loss of cortical definition about the portions of the superior and posterior humeral head (circa series 8/image 82 and 6/37). IMPRESSION: 1. Nondisplaced fracture of the posterosuperior glenoid. 2. Large loculated peripherally enhancing fluid collection extending from the left glenohumeral joint into the proximal and mid biceps musculature. This measures approximately 5.3 x 3.6 x 17.5 cm. Findings are concerning for septic arthritis and intramuscular abscess. 3. Question developing humeral head osteomyelitis. 4. The radial and ulnar arteries are not well visualized. The subclavian, axillary, and brachial arteries are widely patent. Electronically Signed   By: Norman Gatlin M.D.   On: 11/13/2023 20:09   CT Angio Chest PE W and/or Wo Contrast Result Date: 11/13/2023 CLINICAL DATA:  Blood clot in arm, arm pain. Evaluate for pulmonary embolism. EXAM: CT ANGIOGRAPHY CHEST WITH CONTRAST TECHNIQUE: Multidetector CT imaging of the chest was performed using the standard protocol during bolus administration of intravenous contrast. Multiplanar CT image reconstructions and MIPs were obtained to evaluate the vascular anatomy. RADIATION DOSE REDUCTION: This exam was performed according to the departmental dose-optimization program which includes  automated exposure control, adjustment of the mA and/or kV according to patient size and/or use of iterative reconstruction technique. CONTRAST:  OMNIPAQUE  IOHEXOL  350 MG/ML SOLN COMPARISON:  CT chest 08/13/2023 FINDINGS: Cardiovascular: Negative for pulmonary embolism. Coronary artery and aortic atherosclerotic calcification. Normal caliber thoracic aorta. No pericardial effusion. Mediastinum/Nodes: Trachea and esophagus are unremarkable. No thoracic adenopathy. Lungs/Pleura: Patchy bilateral ground-glass opacities. Bronchial wall thickening and mucous plugging in the lower lobes. Left greater than right lower lobe atelectasis or pneumonia. Trace left pleural effusion. No pneumothorax. Upper Abdomen: No acute abnormality. Musculoskeletal: Suspected nondisplaced fracture of the superior left glenoid fossa (series 9/image 67). Peripherally enhancing left glenohumeral joint effusion. If there is concern for septic arthritis fluid sampling is recommended. Review of the MIP images confirms the above findings. IMPRESSION: 1. Negative for pulmonary embolism. 2. Patchy bilateral ground-glass opacities with bronchial wall thickening and mucous plugging in the lower lobes. Findings are concerning for pneumonia. 3. Nondisplaced fracture of the superior left glenoid fossa. Peripherally enhancing left glenohumeral joint effusion. Septic arthritis is not excluded. Consider joint aspiration. 4. Aortic Atherosclerosis (ICD10-I70.0). Electronically Signed   By: Norman Gatlin M.D.   On: 11/13/2023 19:57   UE Venous Duplex ([facility] and [facility] ONLY) Result Date: 11/13/2023 UPPER VENOUS STUDY  Patient Name:  ZYNIA WOJTOWICZ  Date of Exam:   11/13/2023 Medical Rec #: [id number]          Accession #:    [id number] Date of Birth: [birth year]          Patient Gender: F Patient Age:   83 years Exam Location:  [facility] Procedure:      VAS US  UPPER EXTREMITY VENOUS DUPLEX Referring Phys: PRENTICE TEE  --------------------------------------------------------------------------------  Indications: Pain, and Swelling Performing Technologist: Ezzie Potters RVT, RDMS  Examination Guidelines: A complete evaluation includes B-mode imaging, spectral Doppler, color Doppler, and power Doppler as needed of all accessible portions of each vessel. Bilateral testing is considered an integral part of a complete examination.  Limited examinations for reoccurring indications may be performed as noted.  Left Findings: +----------+------------+---------+-----------+----------+---------------------+ LEFT      CompressiblePhasicitySpontaneousProperties       Summary        +----------+------------+---------+-----------+----------+---------------------+ IJV           Full       Yes       Yes                                    +----------+------------+---------+-----------+----------+---------------------+ Subclavian    Full       Yes       Yes                                    +----------+------------+---------+-----------+----------+---------------------+ Axillary      Full       Yes       Yes                                    +----------+------------+---------+-----------+----------+---------------------+ Brachial                 Yes       Yes               not well visualized                                                            patent by                                                               color/doppler     +----------+------------+---------+-----------+----------+---------------------+ Radial        Full                                                        +----------+------------+---------+-----------+----------+---------------------+ Ulnar         Full                                   not well visualized  +----------+------------+---------+-----------+----------+---------------------+ Cephalic      Full                                                         +----------+------------+---------+-----------+----------+---------------------+ Basilic       Full       Yes       Yes                                    +----------+------------+---------+-----------+----------+---------------------+  Large non-vascularized heterogenous structure seen in left upper arm (11.5 x 4.1 x 5.10).  Summary:  Right: No evidence of thrombosis in the subclavian.  Left: No evidence of deep vein thrombosis in the upper extremity. No evidence of superficial vein thrombosis in the upper extremity. Large non-vascularized heterogenous structure seen in left upper arm (11.5 x 4.1 x 5.10).  *See table(s) above for measurements and observations.    Preliminary      EKG: My personal interpretation of EKG shows: Sinus tachycardia heart rate 123.    Assessment/Plan: Principal Problem:   Septic arthritis (HCC) Active Problems:   Hyperlipidemia   CAP (community acquired pneumonia)   Hypothyroidism   Rheumatoid arthritis (HCC)   Debility   Chronic hypoxic respiratory failure (HCC)   Depression   Insulin  dependent type 2 diabetes mellitus (HCC)   Chronic chest pain   Chronic pain syndrome   Diabetic neuropathy (HCC)   Sinus tachycardia   History of pulmonary embolus (PE)   Chronic diastolic CHF (congestive heart failure) (HCC)   GAD (generalized anxiety disorder)   SIRS (systemic inflammatory response syndrome) (HCC)   Elevated troponin   History of GI bleed   History of CAD (coronary artery disease)   Stage IV pressure ulcer of sacral region (HCC)   GERD (gastroesophageal reflux disease)   Chronic constipation    Assessment and Plan: Septic arthritis Left glenohumeral joint abscess formation Left upper extremity intramuscular abscess SIRS Nondisplaced fracture of posterior superior glenoid -Present emergency department complaining of left-sided upper extremity pain and swelling which has been progressively getting worse. - At presentation to ED  patient is tachycardic tachypneic otherwise hemodynamically stable.  CBC no evidence of leukocytosis.  Left elbow extremity DVT ultrasound evidence of blood clot however it showed complex fluid collection - CT angio left upper extremity study showed nondisplaced fracture of the proximal posterior-superior glenoid.Large loculated peripherally enhancing fluid collection extending from the left glenohumeral joint into the proximal and mid biceps musculature. This measures approximately 5.3 x 3.6 x 17.5 cm. Findings are concerning for septic arthritis and intramuscular abscess. 3. Question developing humeral head osteomyelitis. 4. The radial and ulnar arteries are not well visualized. The subclavian, axillary, and brachial arteries are widely patent. -Given patient has left upper extremity abscess formation and septic arthritis ED physician consulted orthopedist Dr. Beverley who recommended IR consult for joint aspiration and intramuscular abscess aspiration.  Does not feel strongly about any IV antibiotic given patient is afebrile and no evidence of leukocytosis. - Given treating patient for community-acquired pneumonia and immunocompromised at the baseline starting broad-spectrum coverage with IV vancomycin  and cefepime . - Obtaining blood culture - Consulting IR for joint aspiration and intramuscular abscess aspiration.  Will need to send fluid study for cell count, Gram stain + culture and anaerobic anaerobic culture as well. -Checking ESR and CRP. -Applying left-sided shoulder sling for comfort. -Orthopedic surgery will see patient tomorrow for formal consult.  Community-acquired pneumonia Chest pain secondary to underlying pneumonia -Given patient presenting with tachycardia and history of previous PE CTA chest obtained to rule out PE.  CTA chest did not show evidence of PE however it showed concern for pneumonia. - Given patient is immunocompromised checking blood culture, sputum culture, respiratory  panel, procalcitonin level, urine Legionella and strep antigen test - Starting broad-spectrum antibiotic coverage with vancomycin  and cefepime . -Based on the culture results and procalcitonin level can decide further course of antibiotic.  History of chronic chest pain Reproducible left-sided chest wall pain History of chronic  chest pain.  Physical exam revealed left-sided upper quadrant around the midclavicular line reproducible pain and patient reporting that left-sided shoulder pain has been radiating to left-sided chest as well.   History of chronic sinus tachycardia Elevated troponin-secondary to demand ischemia -Patient has history of chronic chest pain, chronic dyspnea and physical deconditioning as well as sinus tachycardia. - In the ED patient found to have elevated troponin 21 and 23 without any delta change.  EKG showing sinus tachycardia heart rate 23 - Concern for chest discomfort and congestion in the setting of pneumonia.  At this time there is no concern for acute coronary syndrome. - At home patient takes Lopressor  62.5 mg twice daily.  Due to borderline soft blood pressure decreasing dose to 25 mg twice daily.  Can readjust the dose again based on the heart rate and blood pressure trend.    Chronic diastolic heart failure - Continue Lasix , 40 mg daily Lopressor  twice daily and patient reported not taking spironolactone  anymore.  Chronic Typical chest pain History of nonobstructive CAD -Normal heart cath 07/2021. -Physical exam showed left-sided upper chest wall reproducible chest pain. - Continue Lipitor and Lopressor .  COPD Chronic hypoxic respiratory failure 2 to 3 L oxygen  baseline -Stable.  Nasal cannula oxygen  at baseline.  Continue DuoNeb as needed.  hronic pain syndrome Peripheral neuropathy -Continue gabapentin   Insulin  dependent DM type II -Continue heart healthy carb modified and sliding scale insulin  coverage   Chronic pain syndrome Continue home  Contin 15 mg twice daily   Rheumatoid arthritis - The setting of pneumonia and and septic arthritis holding leflunomide  and Plaquenil  as both are immunosuppressant and is modifying agent.  Continue prednisone  10 mg daily   Hypothyroidism -Continue levothyroxine   History of pulmonary embolism-not on anticoagulation due to history of GI bleed -History of pulmonary embolism in 2024 not on Eliquis  given history of GI bleed in February 2025. -CTA chest ruled out PE at this time.  History of sigmoid colon stricture s/p colectomy and colostomy creation February 2025 -Continue colostomy care.  Stage II sacral ulcer - Consulting inpatient wound care for management of stage II sacral ulcer care and colostomy care  Hyperlipidemia - Continue Lipitor  Chronic constipation - Continue MiraLAX  and senna code  GERD - Continue Protonix   Generalized anxiety disorder - Continue Cymbalta    DVT prophylaxis:  SQ Heparin  Code Status:  Full Code Diet: Heart healthy carb modified diet Family Communication:   Family was present at bedside, at the time of interview. Opportunity was given to ask question and all questions were answered satisfactorily.  Disposition Plan: Need to follow-up with joint fluid study result for further antibiotic guidance.  Pending blood culture sputum culture results as well. Consults: Orthopedic surgery and interventional radiology Admission status:   Inpatient, Telemetry bed  Severity of Illness: The appropriate patient status for this patient is INPATIENT. Inpatient status is judged to be reasonable and necessary in order to provide the required intensity of service to ensure the patient's safety. The patient's presenting symptoms, physical exam findings, and initial radiographic and laboratory data in the context of their chronic comorbidities is felt to place them at high risk for further clinical deterioration. Furthermore, it is not anticipated that the patient will be  medically stable for discharge from the hospital within 2 midnights of admission.   * I certify that at the point of admission it is my clinical judgment that the patient will require inpatient hospital care spanning beyond 2 midnights from the point of  admission due to high intensity of service, high risk for further deterioration and high frequency of surveillance required.DEWAINE    Harrington Jobe, MD [facility]  How to contact the [facility] Attending or Consulting provider 7A - 7P or covering provider during after hours 7P -7A, for this patient.  Check the care team in [facility] and look for a) attending/consulting [facility] provider listed and b) the [facility] team listed Log into [email or web address] and use [facility] universal password to access. If you do not have the password, please contact the hospital operator. Locate the [facility] provider you are looking for under [facility] and page to a number that you can be directly reached. If you still have difficulty reaching the provider, please page the [facility] (Director on Call) for the Hospitalists listed on amion for assistance.  11/13/2023, 10:05 PM

## 2023-11-13 NOTE — Telephone Encounter (Signed)
 Patient is going to the ED by ambulance per [facility]

## 2023-11-13 NOTE — Telephone Encounter (Signed)
 1) ARm pain -> not sure what is going on there.  2) Pulling night o2 off - > no good soution. They can call DME company and see if the cannula can be taped

## 2023-11-13 NOTE — ED Notes (Signed)
 Pt assisted on bed pan. New sheet and bed pad placed.

## 2023-11-13 NOTE — ED Notes (Signed)
Phlebotomy at bedside to obtain blood cultures.

## 2023-11-13 NOTE — Telephone Encounter (Signed)
 I called and spoke with the pt's spouse, OK per DPR and notified of response from MR. She verbalized understanding. Nothing further needed.

## 2023-11-13 NOTE — Progress Notes (Signed)
 Pharmacy Antibiotic Note  [name] is a 83 y.o. female for which pharmacy has been consulted for cefepime  and vancomycin  dosing for Septic arthritis and pneumonia.  Patient with a history of DM, HTN, CAD. Patient presenting with pain in left arm. CT imaging c/f septic arthritis and pna.  SCr 0.95 WBC 10.5; T 98.4; HR 108; RR 12  Plan: Cefepime  2g q12hr  Vancomycin  2000 mg once then 1750 mg q48hr (eAUC 514.2) unless change in renal function Monitor WBC, fever, renal function, cultures De-escalate when able Levels at steady state  Height: 5' 4 (162.6 cm) Weight: 90.3 kg (199 lb) IBW/kg (Calculated) : 54.7  Temp (24hrs), Avg:98.8 F (37.1 C), Min:98.4 F (36.9 C), Max:99.1 F (37.3 C)  Recent Labs  Lab 11/13/23 1301  WBC 10.5  CREATININE 0.95    Estimated Creatinine Clearance: 50.5 mL/min (by C-G formula based on SCr of 0.95 mg/dL).    Allergies  Allergen Reactions   Codeine Swelling and Other (See Comments)    Facial and leg swelling Chest pain    Remicade [Infliximab] Anaphylaxis    sent me into shock    Zestril [Lisinopril] Swelling and Rash    Face and neck swelling    Ofev  [Nintedanib] Diarrhea   Microbiology results: Pending  Thank you for allowing pharmacy to be a part of this patient's care.  Dorn Buttner, PharmD, BCPS 11/13/2023 9:18 PM ED Clinical Pharmacist -  [phone]

## 2023-11-13 NOTE — ED Notes (Signed)
 Notified by CT that unable to do CT angio without 20g IV. EDP made aware. IV team consult to be placed.

## 2023-11-13 NOTE — ED Notes (Signed)
 Contacted CCMD to request that the heart be changed to red.

## 2023-11-13 NOTE — ED Provider Notes (Signed)
 Patient was signed out from the morning provider.  Please refer to their note for complete details on their current presentation.  83 year old female with a past medical history of CAD, rheumatoid arthritis, hypertension, diabetes, and obesity who presented for left arm swelling and pain over the last few days. Pain was noted to be reproducible with movement.  Good pulses and perfusion to the distal extremity.  No recent trauma or fevers.  States the left arm swelling and pain started spontaneously.  She is on a baseline 3 to 4 L of oxygen  at home.  No blood thinners. Tachycardia noted upon start of her workup.  IV fluids were provided and heart rate did respond.  Pending US  and CTA     Physical Exam  BP (!) 144/98   Pulse (!) 108   Temp 98.4 F (36.9 C) (Oral)   Resp 12   Ht 5' 4 (1.626 m)   Wt 90.3 kg   SpO2 100%   BMI 34.16 kg/m   Physical Exam Constitutional:      General: She is not in acute distress.    Appearance: She is obese.   Cardiovascular:     Rate and Rhythm: Tachycardia present.   Musculoskeletal:     Comments: Swelling to the left upper extremity. Good distal perfusion and pulses Normal sensation   Neurological:     General: No focal deficit present.     Mental Status: She is alert.     Procedures  Procedures  ED Course / MDM   Clinical Course as of 11/13/23 2047  Fri Nov 13, 2023  1713 negative for DVT, however US  found a large heterogenous structure in her upper arm (11.5 x 4.1 x 5.10 cm). [AL]  2035 Dr. Beverley with orthopedics consulted.  They recommended hospitalist admission.  They recommended IR tapped the arm tomorrow to evaluate for any purulent fluid.  They will see the patient tomorrow as well and determine a plan at that time.  It was agreed to not proceed with IV antibiotics at this time and we will defer to the hospitalist team [AL]    Clinical Course User Index [AL] Dalia Jollie, DO   Medical Decision Making Amount and/or Complexity  of Data Reviewed Labs: ordered. Radiology: ordered.  Risk Prescription drug management.   Ultrasound was negative for DVT but did note a large heterogeneous structure in the upper arm.  CTA of the chest was overall unremarkable.  CTA of the left upper extremity: Notes a nondisplaced fracture of the posterior superior glenoid, a large loculated fluid collection extending from the glenohumeral joint into the proximal and mid biceps.  This finding was noted to be concerning for septic arthritis or an intramuscular abscess by radiology.  They also reported concern for humeral head osteomyelitis.  Orthopedics was consulted and agrees with admission.  They will see the patient while she is inpatient.  Their recommendations were medicine admission with consult to IR placed tomorrow for a tap to further evaluate the fluid in her left upper extremity.  They do not feel strongly about IV antibiotics at this time.  She has no white blood cell count and has been afebrile.  She is still slightly tachycardic.  Troponin at her baseline.  Pain controlled with morphine .   Allyse Fregeau, DO 11/13/23 2047

## 2023-11-14 ENCOUNTER — Inpatient Hospital Stay (HOSPITAL_COMMUNITY)

## 2023-11-14 DIAGNOSIS — M05741 Rheumatoid arthritis with rheumatoid factor of right hand without organ or systems involvement: Secondary | ICD-10-CM

## 2023-11-14 DIAGNOSIS — Z8679 Personal history of other diseases of the circulatory system: Secondary | ICD-10-CM

## 2023-11-14 DIAGNOSIS — R7989 Other specified abnormal findings of blood chemistry: Secondary | ICD-10-CM

## 2023-11-14 DIAGNOSIS — M009 Pyogenic arthritis, unspecified: Secondary | ICD-10-CM | POA: Diagnosis not present

## 2023-11-14 DIAGNOSIS — E1142 Type 2 diabetes mellitus with diabetic polyneuropathy: Secondary | ICD-10-CM

## 2023-11-14 DIAGNOSIS — J189 Pneumonia, unspecified organism: Secondary | ICD-10-CM | POA: Diagnosis not present

## 2023-11-14 DIAGNOSIS — G8929 Other chronic pain: Secondary | ICD-10-CM

## 2023-11-14 DIAGNOSIS — M05742 Rheumatoid arthritis with rheumatoid factor of left hand without organ or systems involvement: Secondary | ICD-10-CM

## 2023-11-14 DIAGNOSIS — L89154 Pressure ulcer of sacral region, stage 4: Secondary | ICD-10-CM

## 2023-11-14 DIAGNOSIS — I5032 Chronic diastolic (congestive) heart failure: Secondary | ICD-10-CM | POA: Diagnosis not present

## 2023-11-14 LAB — RESPIRATORY PANEL BY PCR

## 2023-11-14 LAB — BLOOD CULTURE ID PANEL (REFLEXED) - BCID2

## 2023-11-14 LAB — COMPREHENSIVE METABOLIC PANEL WITH GFR
ALT: 22 U/L (ref 0–44)
AST: 19 U/L (ref 15–41)
Albumin: 2 g/dL — ABNORMAL LOW (ref 3.5–5.0)
Alkaline Phosphatase: 129 U/L — ABNORMAL HIGH (ref 38–126)
Anion gap: 10 (ref 5–15)
BUN: 14 mg/dL (ref 8–23)
CO2: 29 mmol/L (ref 22–32)
Calcium: 8.1 mg/dL — ABNORMAL LOW (ref 8.9–10.3)
Chloride: 103 mmol/L (ref 98–111)
Creatinine, Ser: 1 mg/dL (ref 0.44–1.00)
GFR, Estimated: 57 mL/min — ABNORMAL LOW (ref >60)
Glucose, Bld: 88 mg/dL (ref 70–99)
Potassium: 3.9 mmol/L (ref 3.5–5.1)
Sodium: 142 mmol/L (ref 135–145)
Total Bilirubin: 0.9 mg/dL (ref 0.0–1.2)
Total Protein: 5.4 g/dL — ABNORMAL LOW (ref 6.5–8.1)

## 2023-11-14 LAB — C-REACTIVE PROTEIN: CRP: 38.8 mg/dL — ABNORMAL HIGH (ref <1.0)

## 2023-11-14 LAB — PROCALCITONIN: Procalcitonin: 0.39 ng/mL

## 2023-11-14 LAB — SYNOVIAL CELL COUNT + DIFF, W/ CRYSTALS
Crystals, Fluid: NONE SEEN
Eosinophils-Synovial: 0 % (ref 0–1)
Lymphocytes-Synovial Fld: 3 % (ref 0–20)
Monocyte-Macrophage-Synovial Fluid: 1 % — ABNORMAL LOW (ref 50–90)
Neutrophil, Synovial: 96 % — ABNORMAL HIGH (ref 0–25)
WBC, Synovial: 78210 /mm3 — ABNORMAL HIGH (ref 0–200)

## 2023-11-14 LAB — CBG MONITORING, ED
Glucose-Capillary: 64 mg/dL — ABNORMAL LOW (ref 70–99)
Glucose-Capillary: 111 mg/dL — ABNORMAL HIGH (ref 70–99)
Glucose-Capillary: 96 mg/dL (ref 70–99)

## 2023-11-14 LAB — CBC
HCT: 29.7 % — ABNORMAL LOW (ref 36.0–46.0)
Hemoglobin: 8.5 g/dL — ABNORMAL LOW (ref 12.0–15.0)
MCH: 28.1 pg (ref 26.0–34.0)
MCHC: 28.6 g/dL — ABNORMAL LOW (ref 30.0–36.0)
MCV: 98 fL (ref 80.0–100.0)
Platelets: 164 10*3/uL (ref 150–400)
RBC: 3.03 MIL/uL — ABNORMAL LOW (ref 3.87–5.11)
RDW: 15.7 % — ABNORMAL HIGH (ref 11.5–15.5)
WBC: 11.2 10*3/uL — ABNORMAL HIGH (ref 4.0–10.5)
nRBC: 0 % (ref 0.0–0.2)

## 2023-11-14 LAB — LACTIC ACID, PLASMA
Lactic Acid, Venous: 2.2 mmol/L (ref 0.5–1.9)
Lactic Acid, Venous: 1.5 mmol/L (ref 0.5–1.9)

## 2023-11-14 LAB — SEDIMENTATION RATE: Sed Rate: 106 mm/h — ABNORMAL HIGH (ref 0–22)

## 2023-11-14 LAB — GLUCOSE, CAPILLARY
Glucose-Capillary: 130 mg/dL — ABNORMAL HIGH (ref 70–99)
Glucose-Capillary: 119 mg/dL — ABNORMAL HIGH (ref 70–99)

## 2023-11-14 MED ORDER — GADOBUTROL 1 MMOL/ML IV SOLN
9.0000 mL | Freq: Once | INTRAVENOUS | Status: AC | PRN
  Administered 2023-11-14: 9 mL via INTRAVENOUS

## 2023-11-14 MED ORDER — LIDOCAINE HCL (PF) 1 % IJ SOLN
5.0000 mL | Freq: Once | INTRAMUSCULAR | Status: AC
  Administered 2023-11-14: 4 mL via INTRADERMAL

## 2023-11-14 MED ORDER — LORAZEPAM 1 MG PO TABS: 1.0000 mg | ORAL_TABLET | Freq: Once | ORAL | Status: DC | PRN

## 2023-11-14 MED ORDER — METOPROLOL TARTRATE 25 MG PO TABS
25.0000 mg | ORAL_TABLET | Freq: Two times a day (BID) | ORAL | Status: DC
  Administered 2023-11-14 – 2023-11-15 (×4): 25 mg via ORAL
  Filled 2023-11-14 (×4): qty 1

## 2023-11-14 MED ORDER — GABAPENTIN 300 MG PO CAPS
300.0000 mg | ORAL_CAPSULE | Freq: Once | ORAL | Status: AC
  Administered 2023-11-14: 300 mg via ORAL
  Filled 2023-11-14: qty 1

## 2023-11-14 MED ORDER — LIDOCAINE HCL (PF) 1 % IJ SOLN: 8.0000 mL | Freq: Once | INTRAMUSCULAR | Status: DC

## 2023-11-14 MED ORDER — ACETAMINOPHEN 500 MG PO TABS
1000.0000 mg | ORAL_TABLET | Freq: Once | ORAL | Status: AC
  Administered 2023-11-14: 1000 mg via ORAL
  Filled 2023-11-14: qty 2

## 2023-11-14 MED ORDER — TRANEXAMIC ACID-NACL 1000-0.7 MG/100ML-% IV SOLN
1000.0000 mg | INTRAVENOUS | Status: AC
  Administered 2023-11-15: 1000 mg via INTRAVENOUS
  Filled 2023-11-14: qty 100

## 2023-11-14 MED ORDER — CEFAZOLIN SODIUM-DEXTROSE 2-4 GM/100ML-% IV SOLN
2.0000 g | INTRAVENOUS | Status: AC
  Administered 2023-11-15: 2 g via INTRAVENOUS

## 2023-11-14 MED ORDER — METRONIDAZOLE 500 MG/100ML IV SOLN
500.0000 mg | Freq: Two times a day (BID) | INTRAVENOUS | Status: DC
  Administered 2023-11-14 – 2023-11-15 (×2): 500 mg via INTRAVENOUS
  Filled 2023-11-14 (×2): qty 100

## 2023-11-14 NOTE — Procedures (Signed)
 Radiology Procedure Note  Risks and benefits of joint injection were discussed with the patient including, but not limited to bleeding, infection, damage to adjacent structures, and low yield.    All of the patient's questions were answered, patient is agreeable to proceed. Consent signed and in chart.  A timeout was performed with all members of the team prior to start of the procedure. Correct patient and correct procedure was confirmed. Allergies were reviewed.   PROCEDURE SUMMARY:  Successful fluoro guided left shoulder aspiration.  2.5 mL of  hazy yellow fluid was obtained. Fluid was sent for labs per request.   No immediate complications.  Patient tolerated well.   EBL = trace  Please see full dictation in imaging section of Epic for procedure details.  Haydon Kalmar H Krystal Teachey PA-C 11/14/2023 10:56 AM

## 2023-11-14 NOTE — Consult Note (Signed)
 WOC consulted for ostomy care; added orders for appropriate supplies needed. Patient has flush stoma needs to use soft convex, barrier rings and belt.   Orders updated.   Alysandra Lobue [facility], CNS, [employer] [phone]

## 2023-11-14 NOTE — Plan of Care (Signed)
  Problem: Education: Goal: Ability to describe self-care measures that may prevent or decrease complications (Diabetes Survival Skills Education) will improve Outcome: Progressing Goal: Individualized Educational Video(s) Outcome: Progressing   Problem: Coping: Goal: Ability to adjust to condition or change in health will improve Outcome: Progressing   Problem: Fluid Volume: Goal: Ability to maintain a balanced intake and output will improve Outcome: Progressing   Problem: Health Behavior/Discharge Planning: Goal: Ability to identify and utilize available resources and services will improve Outcome: Progressing Goal: Ability to manage health-related needs will improve Outcome: Progressing   Problem: Metabolic: Goal: Ability to maintain appropriate glucose levels will improve Outcome: Progressing   Problem: Nutritional: Goal: Maintenance of adequate nutrition will improve Outcome: Progressing Goal: Progress toward achieving an optimal weight will improve Outcome: Progressing   Problem: Skin Integrity: Goal: Risk for impaired skin integrity will decrease Outcome: Progressing   Problem: Tissue Perfusion: Goal: Adequacy of tissue perfusion will improve Outcome: Progressing   Problem: Education: Goal: Knowledge of General Education information will improve Description: Including pain rating scale, medication(s)/side effects and non-pharmacologic comfort measures Outcome: Progressing   Problem: Health Behavior/Discharge Planning: Goal: Ability to manage health-related needs will improve Outcome: Progressing   Problem: Clinical Measurements: Goal: Ability to maintain clinical measurements within normal limits will improve Outcome: Progressing Goal: Will remain free from infection Outcome: Progressing Goal: Diagnostic test results will improve Outcome: Progressing Goal: Respiratory complications will improve Outcome: Progressing Goal: Cardiovascular complication will  be avoided Outcome: Progressing   Problem: Activity: Goal: Risk for activity intolerance will decrease Outcome: Progressing   Problem: Nutrition: Goal: Adequate nutrition will be maintained Outcome: Progressing   Problem: Coping: Goal: Level of anxiety will decrease Outcome: Progressing   Problem: Elimination: Goal: Will not experience complications related to bowel motility Outcome: Progressing Goal: Will not experience complications related to urinary retention Outcome: Progressing   Problem: Pain Managment: Goal: General experience of comfort will improve and/or be controlled Outcome: Progressing   Problem: Safety: Goal: Ability to remain free from injury will improve Outcome: Progressing   Problem: Skin Integrity: Goal: Risk for impaired skin integrity will decrease Outcome: Progressing   Problem: Education: Goal: Knowledge of disease or condition will improve Outcome: Progressing Goal: Knowledge of the prescribed therapeutic regimen will improve Outcome: Progressing Goal: Individualized Educational Video(s) Outcome: Progressing   Problem: Activity: Goal: Ability to tolerate increased activity will improve Outcome: Progressing Goal: Will verbalize the importance of balancing activity with adequate rest periods Outcome: Progressing   Problem: Respiratory: Goal: Ability to maintain a clear airway will improve Outcome: Progressing Goal: Levels of oxygenation will improve Outcome: Progressing Goal: Ability to maintain adequate ventilation will improve Outcome: Progressing

## 2023-11-14 NOTE — ED Notes (Signed)
 Attending provider made aware of pt's heart rate staying between 120-125 bpm. Orders to give morning dose of lopressor  now.

## 2023-11-14 NOTE — ED Notes (Signed)
 Rn emptied pt ostomy bag

## 2023-11-14 NOTE — Progress Notes (Signed)
 PHARMACY - PHYSICIAN COMMUNICATION CRITICAL VALUE ALERT - BLOOD CULTURE IDENTIFICATION (BCID)  6/27 BCID: 1/2 bottles with MRSA  Patient currently on cefepime  and vancomycin . Will follow up Bcx sensitives.   Name of physician (or Provider) Contacted: Dr. Terry Hurst  Changes to prescribed antibiotics required: No changes at this time.  Leonor GORMAN Bash 11/14/2023  9:43 PM

## 2023-11-14 NOTE — Progress Notes (Signed)
 PROGRESS NOTE  [name] FMW:[id number] DOB: 1940-10-25   PCP: Mercer Clotilda SAUNDERS, MD  Patient is from: Home.  DOA: 11/13/2023 LOS: 1  Chief complaints Chief Complaint  Patient presents with   blood clot in arm    Arm Pain     Brief Narrative / Interim history: 83 year old F with PMH of GIB due to sigmoid stricture s/p colectomy and colostomy creation in 06/2023, PE not on anticoagulation due to GIB, nonobstructive CAD, chronic chest pain, COPD, chronic hypoxic RF on 3 L, HFpEF, IDDM-2, rheumatoid arthritis, stage II sacral pressure ulcer, morbid obesity, HTN, dyslipidemia, hypothyroidism and chronic sinus tachycardia brought to ED by EMS due to increased swelling and pain of left arm and left shoulder, as well as left-sided chest pain.  In ED, tachycardic to 120s.  Stable BP.  100% on 4 L.  BMP without significant finding.  WBC 10.5.  Hgb 8.9.  Pro-Cal 0.39.  Troponin 23 and 21.  EKG sinus tachycardia at 128 with abnormal R wave progression.  CT angio chest negative for PE but concerning for pneumonia, nondisplaced fracture of superior left glenoid fossa and peripherally enhancing left glenohumeral joint effusion concerning for septic arthritis.  LUE US  negative for DVT but concerning for heterogeneous structure of left upper arm measuring 11.5 x 4.1 x 5.10 cm.  CT angio LUE showed nondisplaced fracture of posterosuperior glenoid, large loculated peripherally enhancing fluid collection extending from left glenohumeral joint into proximal and mid biceps musculature measuring 5.3 x 3.6 x 7.5 cm concerning for septic arthritis and intramuscular abscess, questionable developing humeral head osteomyelitis.  Orthopedic surgery consulted and recommended IR drainage and Ortho evaluation the morning.  Blood cultures obtained.  Received 2 L of LR bolus, IV morphine , Zofran  and started on broad-spectrum antibiotics.    Subjective: Seen and examined earlier this morning.  No major events overnight  or this morning.  Continues to endorse left shoulder pain and arm pain.  Uncomfortable in ED bed.  Denies shortness of breath or chest pain.  Denies nausea, vomiting abdominal pain.  Objective: Vitals:   11/14/23 0600 11/14/23 0615 11/14/23 0915 11/14/23 0949  BP:  138/86 131/72 134/73  Pulse:  (!) 118 (!) 126 (!) 125  Resp: 16 17  18   Temp:    98.8 F (37.1 C)  TempSrc:    Oral  SpO2:  91% 96% 93%  Weight:      Height:        Examination:  GENERAL: No apparent distress.  Nontoxic. HEENT: MMM.  Vision and hearing grossly intact.  NECK: Supple.  No apparent JVD.  RESP:  No IWOB.  Fair aeration bilaterally. CVS:  RRR. Heart sounds normal.  ABD/GI/GU: BS+. Abd soft, NTND.  MSK/EXT:  Moves extremities.  Left arm in a sling.  Tenderness across superior anterior left shoulder as well as anterior left upper arm.  No erythema. SKIN: no apparent skin lesion or wound NEURO: AA.  Oriented appropriately.  No apparent focal neuro deficit. PSYCH: Calm. Normal affect.   Consultants:  Orthopedic surgery Interventional radiology  Procedures: None  Microbiology summarized: A-20 pathogen RVP nonreactive Blood cultures NGTD  Assessment and plan: Sepsis: Present on arrival.  Has tachycardia and tachypnea.  Source of infection as below. Left shoulder septic arthritis Left glenohumeral joint abscess formation Left upper extremity intramuscular abscess Nondisplaced fracture of posterior superior glenoid -Imaging as above.  CRP 39.  ESR 106.  Hemodynamically stable. -Continue IV cefepime  and vancomycin .  Add IV Flagyl . -IR aspiration  of abscess today.  -Follow Ortho recommendation-scheduled for hemiarthroplasty on 6/29. -Pain control and sling for comfort -Seems to be optimized from cardiopulmonary standpoint   Community-acquired pneumonia: Reported left-sided chest pain but no shortness of breath.  Stable on home oxygen . -Antibiotics as above  Left-sided chest pain: Likely due to  pneumonia and left shoulder infection.  Mildly elevated troponin without significant delta.  No acute ischemic finding on EKG.  Has underlying chronic chest pain as well. -Manage infection as above.   Elevated troponin/history of nonobstructive CAD: Very mild without significant delta.  Likely demand ischemia.  Normal LHC in 07/2021. - Continue Lipitor and Lopressor   Chronic HFpEF?: TTE in 07/2023 with LVEF of 55 to 60%, and normal diastolic parameters although G1DD in 05/2023.  Appears euvolemic but difficult to assess given body habitus. -Continue home Lasix  and metoprolol . -Monitor fluid status   Chronic COPD/chronic hypoxic RF on 3 L at baseline.  Stable.  Does not seem to be on breathing treatments at home. -Bronchodilators as needed - Continue low-dose prednisone  she takes for RA.    Chronic pain syndrome/neuropathy -Continue home MS Contin -, Robaxin  and gabapentin    IDDM-2 with hyperglycemia and level 1 hypoglycemia: A1c 6.0% in 09/2023. Recent Labs  Lab 11/13/23 2255 11/14/23 0807 11/14/23 0955  GLUCAP 135* 64* 111*  -Continue SSI-sensitive  Rheumatoid arthritis: On p.o. prednisone , Arava  and Plaquenil  at home. -Continue p.o. prednisone . -Hold Arava  and Plaquenil  in the setting of active infection   History of PE: Not on anticoagulation due to GI bleed.  CTA chest negative for PE.   History of sigmoid colon stricture s/p colectomy and colostomy creation February 2025 -Continue colostomy care.   Hyperlipidemia -Continue Lipitor   Chronic constipation -Continue MiraLAX  and senna code   GERD -Continue Protonix    Generalized anxiety disorder -Continue Cymbalta   Hypothyroidism -Continue levothyroxine   Class I obesity Body mass index is 34.16 kg/m.      Stage II sacral decubitus: Present on arrival Pressure Injury 08/12/23 Sacrum Bilateral;Upper;Mid;Lower Stage 2 -  Partial thickness loss of dermis presenting as a shallow open injury with a red, pink wound bed  without slough. (Active)  08/12/23 1913  Location: Sacrum  Location Orientation: Bilateral;Upper;Mid;Lower  Staging: Stage 2 -  Partial thickness loss of dermis presenting as a shallow open injury with a red, pink wound bed without slough.  Wound Description (Comments):   DO NOT USE:  Present on Admission: Yes     Pressure Injury 08/12/23 Thigh Left;Posterior;Proximal Stage 1 -  Intact skin with non-blanchable redness of a localized area usually over a bony prominence. Left Inner thigh and Groin, dark purple and red (Active)  08/12/23 1921  Location: Thigh  Location Orientation: Left;Posterior;Proximal  Staging: Stage 1 -  Intact skin with non-blanchable redness of a localized area usually over a bony prominence.  Wound Description (Comments): Left Inner thigh and Groin, dark purple and red  DO NOT USE:  Present on Admission: Yes   DVT prophylaxis:  heparin  injection 5,000 Units Start: 11/13/23 2200 SCDs Start: 11/13/23 2123 Place TED hose Start: 11/13/23 2123  Code Status: Full code Family Communication: None at bedside Level of care: Telemetry Cardiac Status is: Inpatient Remains inpatient appropriate because: Sepsis in the setting of left shoulder infection/abscess   Final disposition: To be determined   55 minutes with more than 50% spent in reviewing records, counseling patient/family and coordinating care.   Sch Meds:  Scheduled Meds:  atorvastatin   10 mg Oral Daily   cholecalciferol   1,000 Units Oral Daily   DULoxetine   60 mg Oral Daily   furosemide   40 mg Oral Daily   gabapentin   300 mg Oral BID   guaiFENesin   1,200 mg Oral BID   heparin   5,000 Units Subcutaneous Q8H   insulin  aspart  0-5 Units Subcutaneous QHS   insulin  aspart  0-9 Units Subcutaneous TID WC   levothyroxine   50 mcg Oral Q0600   metoprolol  tartrate  25 mg Oral BID   morphine   15 mg Oral Q12H   pantoprazole   40 mg Oral BID   predniSONE   10 mg Oral Q breakfast   sodium chloride  flush  3 mL  Intravenous Q12H   sodium chloride  flush  3 mL Intravenous Q12H   Continuous Infusions:  sodium chloride      ceFEPime  (MAXIPIME ) IV 2 g (11/14/23 0916)   [START ON 11/15/2023] vancomycin      PRN Meds:.sodium chloride , acetaminophen , bisacodyl , hyoscyamine , ipratropium-albuterol , LORazepam , methocarbamol , ondansetron  (ZOFRAN ) IV, polyethylene glycol, senna, sodium chloride  flush  Antimicrobials: Anti-infectives (From admission, onward)    Start     Dose/Rate Route Frequency Ordered Stop   11/15/23 2130  vancomycin  (VANCOREADY) IVPB 1750 mg/350 mL       Placed in Followed by Linked Group   1,750 mg 175 mL/hr over 120 Minutes Intravenous Every 48 hours 11/13/23 2124     11/13/23 2130  ceFEPIme  (MAXIPIME ) 2 g in sodium chloride  0.9 % 100 mL IVPB        2 g 200 mL/hr over 30 Minutes Intravenous Every 12 hours 11/13/23 2124     11/13/23 2130  vancomycin  (VANCOREADY) IVPB 2000 mg/400 mL       Placed in Followed by Linked Group   2,000 mg 200 mL/hr over 120 Minutes Intravenous  Once 11/13/23 2124 11/14/23 0236        I have personally reviewed the following labs and images: CBC: Recent Labs  Lab 11/13/23 1301 11/14/23 0512  WBC 10.5 11.2*  HGB 8.9* 8.5*  HCT 30.7* 29.7*  MCV 98.7 98.0  PLT 153 164   BMP &GFR Recent Labs  Lab 11/13/23 1301 11/14/23 0512  NA 139 142  K 4.1 3.9  CL 100 103  CO2 28 29  GLUCOSE 152* 88  BUN 16 14  CREATININE 0.95 1.00  CALCIUM  8.0* 8.1*   Estimated Creatinine Clearance: 48 mL/min (by C-G formula based on SCr of 1 mg/dL). Liver & Pancreas: Recent Labs  Lab 11/14/23 0512  AST 19  ALT 22  ALKPHOS 129*  BILITOT 0.9  PROT 5.4*  ALBUMIN  2.0*   No results for input(s): LIPASE, AMYLASE in the last 168 hours. No results for input(s): AMMONIA in the last 168 hours. Diabetic: No results for input(s): HGBA1C in the last 72 hours. Recent Labs  Lab 11/13/23 2255 11/14/23 0807 11/14/23 0955  GLUCAP 135* 64* 111*   Cardiac  Enzymes: No results for input(s): CKTOTAL, CKMB, CKMBINDEX, TROPONINI in the last 168 hours. No results for input(s): PROBNP in the last 8760 hours. Coagulation Profile: No results for input(s): INR, PROTIME in the last 168 hours. Thyroid  Function Tests: No results for input(s): TSH, T4TOTAL, FREET4, T3FREE, THYROIDAB in the last 72 hours. Lipid Profile: No results for input(s): CHOL, HDL, LDLCALC, TRIG, CHOLHDL, LDLDIRECT in the last 72 hours. Anemia Panel: No results for input(s): VITAMINB12, FOLATE, FERRITIN, TIBC, IRON , RETICCTPCT in the last 72 hours. Urine analysis:    Component Value Date/Time   COLORURINE YELLOW 07/15/2023 1616   APPEARANCEUR HAZY (  A) 07/15/2023 1616   LABSPEC >1.046 (H) 07/15/2023 1616   PHURINE 5.0 07/15/2023 1616   GLUCOSEU NEGATIVE 07/15/2023 1616   HGBUR NEGATIVE 07/15/2023 1616   BILIRUBINUR NEGATIVE 07/15/2023 1616   BILIRUBINUR 1+ 01/30/2021 1450   KETONESUR 5 (A) 07/15/2023 1616   PROTEINUR NEGATIVE 07/15/2023 1616   UROBILINOGEN negative (A) 01/30/2021 1450   UROBILINOGEN 0.2 11/28/2019 1424   NITRITE NEGATIVE 07/15/2023 1616   LEUKOCYTESUR NEGATIVE 07/15/2023 1616   Sepsis Labs: Invalid input(s): PROCALCITONIN, LACTICIDVEN  Microbiology: Recent Results (from the past 240 hours)  Culture, blood (Routine X 2) w Reflex to ID Panel     Status: None (Preliminary result)   Collection Time: 11/13/23 11:31 PM   Specimen: BLOOD RIGHT HAND  Result Value Ref Range Status   Specimen Description BLOOD RIGHT HAND  Final   Special Requests   Final    BOTTLES DRAWN AEROBIC ONLY Blood Culture results may not be optimal due to an inadequate volume of blood received in culture bottles   Culture   Final    NO GROWTH < 12 HOURS Performed at [facility], [address] [place], [place] [address]    Report Status PENDING  Incomplete  Culture, blood (Routine X 2) w Reflex to ID Panel     Status: None  (Preliminary result)   Collection Time: 11/13/23 11:48 PM   Specimen: BLOOD RIGHT FOREARM  Result Value Ref Range Status   Specimen Description BLOOD RIGHT FOREARM  Final   Special Requests   Final    BOTTLES DRAWN AEROBIC ONLY Blood Culture results may not be optimal due to an inadequate volume of blood received in culture bottles   Culture   Final    NO GROWTH < 12 HOURS Performed at [facility], [address], [place], [place] [address]    Report Status PENDING  Incomplete  Respiratory (~20 pathogens) panel by PCR     Status: None   Collection Time: 11/14/23  5:12 AM   Specimen: Nasopharyngeal Swab; Respiratory  Result Value Ref Range Status   Adenovirus NOT DETECTED NOT DETECTED Final   Coronavirus 229E NOT DETECTED NOT DETECTED Final    Comment: (NOTE) The Coronavirus on the Respiratory Panel, DOES NOT test for the novel  Coronavirus (2019 nCoV)    Coronavirus HKU1 NOT DETECTED NOT DETECTED Final   Coronavirus NL63 NOT DETECTED NOT DETECTED Final   Coronavirus OC43 NOT DETECTED NOT DETECTED Final   Metapneumovirus NOT DETECTED NOT DETECTED Final   Rhinovirus / Enterovirus NOT DETECTED NOT DETECTED Final   Influenza A NOT DETECTED NOT DETECTED Final   Influenza B NOT DETECTED NOT DETECTED Final   Parainfluenza Virus 1 NOT DETECTED NOT DETECTED Final   Parainfluenza Virus 2 NOT DETECTED NOT DETECTED Final   Parainfluenza Virus 3 NOT DETECTED NOT DETECTED Final   Parainfluenza Virus 4 NOT DETECTED NOT DETECTED Final   Respiratory Syncytial Virus NOT DETECTED NOT DETECTED Final   Bordetella pertussis NOT DETECTED NOT DETECTED Final   Bordetella Parapertussis NOT DETECTED NOT DETECTED Final   Chlamydophila pneumoniae NOT DETECTED NOT DETECTED Final   Mycoplasma pneumoniae NOT DETECTED NOT DETECTED Final    Comment: Performed at [facility], [address] [place], [place] [address]    Radiology Studies: CT ANGIO UP EXTREM LEFT W &/OR WO CONTAST Result Date:  11/13/2023 CLINICAL DATA:  Abnormal ultrasound, blood clot in arm EXAM: CT ANGIOGRAPHY OF LEFT UPPER EXTREMITY TECHNIQUE: Multidetector CT imaging  of the LEFT UPPER EXTREMITY was performed using the standard protocol during bolus administration of intravenous contrast. Multiplanar CT image reconstructions and MIPs were obtained to evaluate the vascular anatomy. RADIATION DOSE REDUCTION: This exam was performed according to the departmental dose-optimization program which includes automated exposure control, adjustment of the mA and/or kV according to patient size and/or use of iterative reconstruction technique. CONTRAST:  OMNIPAQUE  IOHEXOL  350 MG/ML SOLN COMPARISON:  None Available. FINDINGS: VASCULAR Subclavian artery: Patent where visualized. No acute luminal abnormality. No aneurysm or ectasia. Axillary artery: Widely patent. No acute luminal abnormality. No aneurysm or ectasia. Brachial artery: Widely patent. No acute luminal abnormality. No aneurysm or ectasia. Radial and ulnar arteries: Not well visualized Veins: Not well opacified. Nonvascular: Nondisplaced fracture of the posterosuperior glenoid large loculated peripherally enhancing fluid collection extending from the left glenohumeral joint into the proximal and mid biceps musculature. This measures approximately 5.3 x 3.6 x 17.5 cm. Adjacent intramuscular and subcutaneous edema throughout the left arm. There is loss of cortical definition about the portions of the superior and posterior humeral head (circa series 8/image 82 and 6/37). IMPRESSION: 1. Nondisplaced fracture of the posterosuperior glenoid. 2. Large loculated peripherally enhancing fluid collection extending from the left glenohumeral joint into the proximal and mid biceps musculature. This measures approximately 5.3 x 3.6 x 17.5 cm. Findings are concerning for septic arthritis and intramuscular abscess. 3. Question developing humeral head osteomyelitis. 4. The radial and ulnar arteries  are not well visualized. The subclavian, axillary, and brachial arteries are widely patent. Electronically Signed   By: Norman Gatlin M.D.   On: 11/13/2023 20:09   CT Angio Chest PE W and/or Wo Contrast Result Date: 11/13/2023 CLINICAL DATA:  Blood clot in arm, arm pain. Evaluate for pulmonary embolism. EXAM: CT ANGIOGRAPHY CHEST WITH CONTRAST TECHNIQUE: Multidetector CT imaging of the chest was performed using the standard protocol during bolus administration of intravenous contrast. Multiplanar CT image reconstructions and MIPs were obtained to evaluate the vascular anatomy. RADIATION DOSE REDUCTION: This exam was performed according to the departmental dose-optimization program which includes automated exposure control, adjustment of the mA and/or kV according to patient size and/or use of iterative reconstruction technique. CONTRAST:  OMNIPAQUE  IOHEXOL  350 MG/ML SOLN COMPARISON:  CT chest 08/13/2023 FINDINGS: Cardiovascular: Negative for pulmonary embolism. Coronary artery and aortic atherosclerotic calcification. Normal caliber thoracic aorta. No pericardial effusion. Mediastinum/Nodes: Trachea and esophagus are unremarkable. No thoracic adenopathy. Lungs/Pleura: Patchy bilateral ground-glass opacities. Bronchial wall thickening and mucous plugging in the lower lobes. Left greater than right lower lobe atelectasis or pneumonia. Trace left pleural effusion. No pneumothorax. Upper Abdomen: No acute abnormality. Musculoskeletal: Suspected nondisplaced fracture of the superior left glenoid fossa (series 9/image 67). Peripherally enhancing left glenohumeral joint effusion. If there is concern for septic arthritis fluid sampling is recommended. Review of the MIP images confirms the above findings. IMPRESSION: 1. Negative for pulmonary embolism. 2. Patchy bilateral ground-glass opacities with bronchial wall thickening and mucous plugging in the lower lobes. Findings are concerning for pneumonia. 3.  Nondisplaced fracture of the superior left glenoid fossa. Peripherally enhancing left glenohumeral joint effusion. Septic arthritis is not excluded. Consider joint aspiration. 4. Aortic Atherosclerosis (ICD10-I70.0). Electronically Signed   By: Norman Gatlin M.D.   On: 11/13/2023 19:57   UE Venous Duplex ([facility] and [facility] ONLY) Result Date: 11/13/2023 UPPER VENOUS STUDY  Patient Name:  AMRAN MALTER  Date of Exam:   11/13/2023 Medical Rec #: [id number]  Accession #:    [id number] Date of Birth: [birth year]          Patient Gender: F Patient Age:   15 years Exam Location:  [facility] Procedure:      VAS US  UPPER EXTREMITY VENOUS DUPLEX Referring Phys: ANDREW TEE --------------------------------------------------------------------------------  Indications: Pain, and Swelling Performing Technologist: Jody Hill RVT, RDMS  Examination Guidelines: A complete evaluation includes B-mode imaging, spectral Doppler, color Doppler, and power Doppler as needed of all accessible portions of each vessel. Bilateral testing is considered an integral part of a complete examination. Limited examinations for reoccurring indications may be performed as noted.  Left Findings: +----------+------------+---------+-----------+----------+---------------------+ LEFT      CompressiblePhasicitySpontaneousProperties       Summary        +----------+------------+---------+-----------+----------+---------------------+ IJV           Full       Yes       Yes                                    +----------+------------+---------+-----------+----------+---------------------+ Subclavian    Full       Yes       Yes                                    +----------+------------+---------+-----------+----------+---------------------+ Axillary      Full       Yes       Yes                                    +----------+------------+---------+-----------+----------+---------------------+ Brachial                 Yes        Yes               not well visualized                                                            patent by                                                               color/doppler     +----------+------------+---------+-----------+----------+---------------------+ Radial        Full                                                        +----------+------------+---------+-----------+----------+---------------------+ Ulnar         Full                                   not well visualized  +----------+------------+---------+-----------+----------+---------------------+ Cephalic  Full                                                        +----------+------------+---------+-----------+----------+---------------------+ Basilic       Full       Yes       Yes                                    +----------+------------+---------+-----------+----------+---------------------+ Large non-vascularized heterogenous structure seen in left upper arm (11.5 x 4.1 x 5.10).  Summary:  Right: No evidence of thrombosis in the subclavian.  Left: No evidence of deep vein thrombosis in the upper extremity. No evidence of superficial vein thrombosis in the upper extremity. Large non-vascularized heterogenous structure seen in left upper arm (11.5 x 4.1 x 5.10).  *See table(s) above for measurements and observations.    Preliminary       Sohrab Keelan T. Petr Bontempo [facility]  If 7PM-7AM, please contact night-coverage [email or web address] 11/14/2023, 10:56 AM

## 2023-11-14 NOTE — Progress Notes (Signed)
 Went to speak to the patient in the ED> She was currently in MRI. I spoke with the patient's son. Based on images there is concern for infection of the left shoulder. We are waiting results of aspiration and MRI to determine official treatment plan. If patient shows signs of infection we would recommend, left shoulder hemiarthroplasty. Keep NPO at midnight in anticipation for surgery tomorrow.  Aleck Stalling, PA-C 11/14/23

## 2023-11-14 NOTE — H&P (View-Only) (Signed)
 Went to speak to the patient in the ED> She was currently in MRI. I spoke with the patient's son. Based on images there is concern for infection of the left shoulder. We are waiting results of aspiration and MRI to determine official treatment plan. If patient shows signs of infection we would recommend, left shoulder hemiarthroplasty. Keep NPO at midnight in anticipation for surgery tomorrow.  Aleck Stalling, PA-C 11/14/23

## 2023-11-14 NOTE — Consult Note (Signed)
 Please note that the [facility] nursing team is utilizing a standardized work plan to manage patient consults. We are triaging consults and will try to see the patients within 48 hours. Wound photos in the patient's chart allow us  to consult on the patient in the most efficient and timely manner.    Requested images 6/28 Kyrah Schiro [facility] MSN,RN,CWOCN, CNS, [place] [phone]

## 2023-11-14 NOTE — Procedures (Signed)
 Vascular and Interventional Radiology Procedure Note  Patient: [name] DOB: 1941-03-02 Medical Record Number: [id number] Note Date/Time: 11/14/23 11:07 AM   Performing Physician: Thom Hall, MD Assistant(s): None  Diagnosis: LUE abscess  Procedure: ASPIRATION of a LEFT UPPER EXTREMITY INTRAMUSCULAR COLLECTION  Anesthesia: Local Anesthetic Complications: None Estimated Blood Loss: Minimal Specimens: Sent for Gram Stain, Aerobe Culture, and Anerobe Culture  Findings:  Successful Ultrasound-guided aspiration of a LUE IM abscess within the biceps, with 45 mL drawn Sample was submitted for microbiological analysis  See detailed procedure note with images in PACS. The patient tolerated the procedure well without incident or complication and was returned to Floor Bed in stable condition.    Thom Hall, MD [facility]   Pager. [phone] Clinic. [phone]

## 2023-11-15 ENCOUNTER — Inpatient Hospital Stay (HOSPITAL_COMMUNITY): Admitting: Registered Nurse

## 2023-11-15 ENCOUNTER — Encounter (HOSPITAL_COMMUNITY): Payer: Self-pay | Admitting: Internal Medicine

## 2023-11-15 ENCOUNTER — Encounter (HOSPITAL_COMMUNITY)
Admission: EM | Disposition: A | Discharge status: Skilled Nursing Facility | Payer: Self-pay | Source: Home / Self Care | Attending: Internal Medicine

## 2023-11-15 ENCOUNTER — Inpatient Hospital Stay (HOSPITAL_COMMUNITY)

## 2023-11-15 ENCOUNTER — Other Ambulatory Visit: Payer: Self-pay

## 2023-11-15 DIAGNOSIS — M00012 Staphylococcal arthritis, left shoulder: Secondary | ICD-10-CM | POA: Diagnosis not present

## 2023-11-15 DIAGNOSIS — B9562 Methicillin resistant Staphylococcus aureus infection as the cause of diseases classified elsewhere: Secondary | ICD-10-CM

## 2023-11-15 DIAGNOSIS — R6510 Systemic inflammatory response syndrome (SIRS) of non-infectious origin without acute organ dysfunction: Secondary | ICD-10-CM | POA: Diagnosis not present

## 2023-11-15 DIAGNOSIS — Z8679 Personal history of other diseases of the circulatory system: Secondary | ICD-10-CM | POA: Diagnosis not present

## 2023-11-15 DIAGNOSIS — I5032 Chronic diastolic (congestive) heart failure: Secondary | ICD-10-CM | POA: Diagnosis not present

## 2023-11-15 DIAGNOSIS — M069 Rheumatoid arthritis, unspecified: Secondary | ICD-10-CM

## 2023-11-15 DIAGNOSIS — R000 Tachycardia, unspecified: Secondary | ICD-10-CM | POA: Diagnosis not present

## 2023-11-15 DIAGNOSIS — R5381 Other malaise: Secondary | ICD-10-CM

## 2023-11-15 DIAGNOSIS — L02414 Cutaneous abscess of left upper limb: Secondary | ICD-10-CM | POA: Diagnosis not present

## 2023-11-15 DIAGNOSIS — R7881 Bacteremia: Secondary | ICD-10-CM | POA: Diagnosis not present

## 2023-11-15 DIAGNOSIS — I110 Hypertensive heart disease with heart failure: Secondary | ICD-10-CM

## 2023-11-15 DIAGNOSIS — I2510 Atherosclerotic heart disease of native coronary artery without angina pectoris: Secondary | ICD-10-CM | POA: Diagnosis not present

## 2023-11-15 DIAGNOSIS — M009 Pyogenic arthritis, unspecified: Secondary | ICD-10-CM | POA: Diagnosis not present

## 2023-11-15 DIAGNOSIS — M86112 Other acute osteomyelitis, left shoulder: Secondary | ICD-10-CM

## 2023-11-15 HISTORY — PX: SHOULDER HEMI-ARTHROPLASTY: SHX5049

## 2023-11-15 LAB — MRSA NEXT GEN BY PCR, NASAL: MRSA by PCR Next Gen: DETECTED — AB

## 2023-11-15 LAB — RENAL FUNCTION PANEL
Albumin: 1.7 g/dL — ABNORMAL LOW (ref 3.5–5.0)
Anion gap: 8 (ref 5–15)
BUN: 11 mg/dL (ref 8–23)
CO2: 27 mmol/L (ref 22–32)
Calcium: 7.5 mg/dL — ABNORMAL LOW (ref 8.9–10.3)
Chloride: 103 mmol/L (ref 98–111)
Creatinine, Ser: 0.94 mg/dL (ref 0.44–1.00)
GFR, Estimated: >60 mL/min (ref >60)
Glucose, Bld: 82 mg/dL (ref 70–99)
Phosphorus: 3.2 mg/dL (ref 2.5–4.6)
Potassium: 3.4 mmol/L — ABNORMAL LOW (ref 3.5–5.1)
Sodium: 138 mmol/L (ref 135–145)

## 2023-11-15 LAB — CBC
HCT: 25.8 % — ABNORMAL LOW (ref 36.0–46.0)
Hemoglobin: 7.5 g/dL — ABNORMAL LOW (ref 12.0–15.0)
MCH: 27.6 pg (ref 26.0–34.0)
MCHC: 29.1 g/dL — ABNORMAL LOW (ref 30.0–36.0)
MCV: 94.9 fL (ref 80.0–100.0)
Platelets: 160 10*3/uL (ref 150–400)
RBC: 2.72 MIL/uL — ABNORMAL LOW (ref 3.87–5.11)
RDW: 15.7 % — ABNORMAL HIGH (ref 11.5–15.5)
WBC: 7 10*3/uL (ref 4.0–10.5)
nRBC: 0 % (ref 0.0–0.2)

## 2023-11-15 LAB — GLUCOSE, CAPILLARY
Glucose-Capillary: 81 mg/dL (ref 70–99)
Glucose-Capillary: 98 mg/dL (ref 70–99)
Glucose-Capillary: 212 mg/dL — ABNORMAL HIGH (ref 70–99)
Glucose-Capillary: 150 mg/dL — ABNORMAL HIGH (ref 70–99)

## 2023-11-15 LAB — MAGNESIUM: Magnesium: 1.9 mg/dL (ref 1.7–2.4)

## 2023-11-15 SURGERY — HEMIARTHROPLASTY, SHOULDER
Anesthesia: Regional | Site: Shoulder | Laterality: Left

## 2023-11-15 MED ORDER — VANCOMYCIN HCL 1000 MG IV SOLR
INTRAVENOUS | Status: DC | PRN
  Administered 2023-11-15: 1000 mg

## 2023-11-15 MED ORDER — BUPIVACAINE LIPOSOME 1.3 % IJ SUSP
INTRAMUSCULAR | Status: DC | PRN
  Administered 2023-11-15: 10 mL via PERINEURAL

## 2023-11-15 MED ORDER — OXYCODONE HCL 5 MG/5ML PO SOLN: 5.0000 mg | Freq: Once | ORAL | Status: DC | PRN

## 2023-11-15 MED ORDER — ACETAMINOPHEN 10 MG/ML IV SOLN
INTRAVENOUS | Status: AC
  Filled 2023-11-15: qty 100

## 2023-11-15 MED ORDER — CHLORHEXIDINE GLUCONATE 0.12 % MT SOLN
OROMUCOSAL | Status: AC
  Filled 2023-11-15: qty 15

## 2023-11-15 MED ORDER — ACETAMINOPHEN 10 MG/ML IV SOLN
1000.0000 mg | Freq: Once | INTRAVENOUS | Status: DC | PRN
  Administered 2023-11-15: 1000 mg via INTRAVENOUS

## 2023-11-15 MED ORDER — ALBUMIN HUMAN 5 % IV SOLN: INTRAVENOUS | Status: DC | PRN

## 2023-11-15 MED ORDER — FENTANYL CITRATE (PF) 250 MCG/5ML IJ SOLN
INTRAMUSCULAR | Status: AC
  Filled 2023-11-15: qty 5

## 2023-11-15 MED ORDER — FENTANYL CITRATE (PF) 250 MCG/5ML IJ SOLN
INTRAMUSCULAR | Status: DC | PRN
  Administered 2023-11-15: 25 ug via INTRAVENOUS

## 2023-11-15 MED ORDER — SUGAMMADEX SODIUM 200 MG/2ML IV SOLN
INTRAVENOUS | Status: DC | PRN
  Administered 2023-11-15: 180.6 mg via INTRAVENOUS

## 2023-11-15 MED ORDER — PROPOFOL 10 MG/ML IV BOLUS
INTRAVENOUS | Status: AC
  Filled 2023-11-15: qty 20

## 2023-11-15 MED ORDER — OXYCODONE HCL 5 MG PO TABS: 5.0000 mg | ORAL_TABLET | Freq: Once | ORAL | Status: DC | PRN

## 2023-11-15 MED ORDER — DEXAMETHASONE SODIUM PHOSPHATE 10 MG/ML IJ SOLN
INTRAMUSCULAR | Status: DC | PRN
  Administered 2023-11-15: 5 mg via INTRAVENOUS

## 2023-11-15 MED ORDER — LACTATED RINGERS IV SOLN: INTRAVENOUS | Status: DC

## 2023-11-15 MED ORDER — PROPOFOL 500 MG/50ML IV EMUL
INTRAVENOUS | Status: DC | PRN
  Administered 2023-11-15: 150 ug/kg/min via INTRAVENOUS

## 2023-11-15 MED ORDER — FENTANYL CITRATE (PF) 100 MCG/2ML IJ SOLN: 25.0000 ug | INTRAMUSCULAR | Status: DC | PRN

## 2023-11-15 MED ORDER — VANCOMYCIN HCL 1000 MG IV SOLR
INTRAVENOUS | Status: AC
  Filled 2023-11-15: qty 20

## 2023-11-15 MED ORDER — PROPOFOL 10 MG/ML IV BOLUS
INTRAVENOUS | Status: DC | PRN
  Administered 2023-11-15: 110 mg via INTRAVENOUS

## 2023-11-15 MED ORDER — BUPIVACAINE-EPINEPHRINE (PF) 0.5% -1:200000 IJ SOLN
INTRAMUSCULAR | Status: DC | PRN
  Administered 2023-11-15: 15 mL via PERINEURAL

## 2023-11-15 MED ORDER — CEFAZOLIN SODIUM-DEXTROSE 2-4 GM/100ML-% IV SOLN
INTRAVENOUS | Status: AC
  Filled 2023-11-15: qty 100

## 2023-11-15 MED ORDER — BUPIVACAINE-EPINEPHRINE (PF) 0.25% -1:200000 IJ SOLN
INTRAMUSCULAR | Status: AC
  Filled 2023-11-15: qty 30

## 2023-11-15 MED ORDER — ONDANSETRON HCL 4 MG/2ML IJ SOLN
INTRAMUSCULAR | Status: DC | PRN
  Administered 2023-11-15: 4 mg via INTRAVENOUS

## 2023-11-15 MED ORDER — TRANEXAMIC ACID-NACL 1000-0.7 MG/100ML-% IV SOLN
INTRAVENOUS | Status: AC
  Filled 2023-11-15: qty 100

## 2023-11-15 MED ORDER — CHLORHEXIDINE GLUCONATE 0.12 % MT SOLN
15.0000 mL | Freq: Once | OROMUCOSAL | Status: AC
  Administered 2023-11-15: 15 mL via OROMUCOSAL

## 2023-11-15 MED ORDER — ROCURONIUM BROMIDE 10 MG/ML (PF) SYRINGE
PREFILLED_SYRINGE | INTRAVENOUS | Status: DC | PRN
  Administered 2023-11-15: 60 mg via INTRAVENOUS

## 2023-11-15 MED ORDER — ORAL CARE MOUTH RINSE: 15.0000 mL | Freq: Once | OROMUCOSAL | Status: AC

## 2023-11-15 MED ORDER — INSULIN ASPART 100 UNIT/ML IJ SOLN: 0.0000 [IU] | INTRAMUSCULAR | Status: DC | PRN

## 2023-11-15 MED ORDER — POTASSIUM CHLORIDE CRYS ER 20 MEQ PO TBCR
60.0000 meq | EXTENDED_RELEASE_TABLET | Freq: Once | ORAL | Status: AC
  Administered 2023-11-15: 60 meq via ORAL
  Filled 2023-11-15: qty 3

## 2023-11-15 MED ORDER — PHENYLEPHRINE HCL-NACL 20-0.9 MG/250ML-% IV SOLN
INTRAVENOUS | Status: DC | PRN
  Administered 2023-11-15: 50 ug/min via INTRAVENOUS

## 2023-11-15 SURGICAL SUPPLY — 59 items
BAG COUNTER SPONGE SURGICOUNT (BAG) ×1 IMPLANT
BENZOIN TINCTURE PRP APPL 2/3 (GAUZE/BANDAGES/DRESSINGS) ×1 IMPLANT
BLADE SAW SAG 29X58X.64 (BLADE) IMPLANT
BLADE SAW SGTL 73X25 THK (BLADE) IMPLANT
CEMENT BONE DEPUY (Cement) IMPLANT
CHLORAPREP W/TINT 26 (MISCELLANEOUS) ×2 IMPLANT
CNTNR URN SCR LID CUP LEK RST (MISCELLANEOUS) IMPLANT
COVER SURGICAL LIGHT HANDLE (MISCELLANEOUS) ×1 IMPLANT
DRAPE C-ARM 42X72 X-RAY (DRAPES) IMPLANT
DRAPE HALF SHEET 40X57 (DRAPES) ×1 IMPLANT
DRAPE INCISE IOBAN 66X45 STRL (DRAPES) ×2 IMPLANT
DRAPE SURG ORHT 6 SPLT 77X108 (DRAPES) ×2 IMPLANT
DRAPE SWITCH (DRAPES) ×1 IMPLANT
DRSG AQUACEL AG ADV 3.5X 6 (GAUZE/BANDAGES/DRESSINGS) ×1 IMPLANT
DRSG AQUACEL AG ADV 3.5X10 (GAUZE/BANDAGES/DRESSINGS) IMPLANT
DRSG TEGADERM 4X4.75 (GAUZE/BANDAGES/DRESSINGS) IMPLANT
ELECT CAUTERY BLADE 6.4 (BLADE) ×1 IMPLANT
ELECTRODE BLDE 4.0 EZ CLN MEGD (MISCELLANEOUS) ×1 IMPLANT
ELECTRODE REM PT RTRN 9FT ADLT (ELECTROSURGICAL) ×1 IMPLANT
GAUZE XEROFORM 5X9 LF (GAUZE/BANDAGES/DRESSINGS) IMPLANT
GLOVE BIO SURGEON STRL SZ 6.5 (GLOVE) ×1 IMPLANT
GLOVE BIOGEL PI IND STRL 8 (GLOVE) ×1 IMPLANT
GLOVE ECLIPSE 8.0 STRL XLNG CF (GLOVE) ×2 IMPLANT
GLOVE INDICATOR 6.5 STRL GRN (GLOVE) ×1 IMPLANT
GOWN STRL REUS W/ TWL LRG LVL3 (GOWN DISPOSABLE) ×1 IMPLANT
GUIDEPIN HUM 3X100 (PIN) IMPLANT
KIT BASIN OR (CUSTOM PROCEDURE TRAY) ×1 IMPLANT
KIT STABILIZATION SHOULDER (MISCELLANEOUS) ×1 IMPLANT
KIT TURNOVER KIT B (KITS) ×1 IMPLANT
MANIFOLD NEPTUNE II (INSTRUMENTS) ×1 IMPLANT
NDL HYPO 25GX1X1/2 BEV (NEEDLE) IMPLANT
NDL MAYO TROCAR (NEEDLE) ×1 IMPLANT
NEEDLE HYPO 25GX1X1/2 BEV (NEEDLE) IMPLANT
NEEDLE MAYO TROCAR (NEEDLE) ×1 IMPLANT
NS IRRIG 1000ML POUR BTL (IV SOLUTION) ×1 IMPLANT
PACK SHOULDER (CUSTOM PROCEDURE TRAY) ×1 IMPLANT
PAD ARMBOARD POSITIONER FOAM (MISCELLANEOUS) ×2 IMPLANT
RESTRAINT HEAD UNIVERSAL NS (MISCELLANEOUS) ×1 IMPLANT
SET HNDPC FAN SPRY TIP SCT (DISPOSABLE) IMPLANT
SLING ARM FOAM STRAP LRG (SOFTGOODS) IMPLANT
SPONGE T-LAP 18X18 ~~LOC~~+RFID (SPONGE) ×1 IMPLANT
STEM HUMERAL HEAD COUPLER (Orthopedic Implant) IMPLANT
STEM HUMERAL PLUS LONG 1+ (Orthopedic Implant) IMPLANT
STRIP CLOSURE SKIN 1/2X4 (GAUZE/BANDAGES/DRESSINGS) ×1 IMPLANT
SUCTION TUBE FRAZIER 10FR DISP (SUCTIONS) ×1 IMPLANT
SUT ETHIBOND 2 V 37 (SUTURE) ×1 IMPLANT
SUT ETHIBOND NAB CT1 #1 30IN (SUTURE) ×1 IMPLANT
SUT ETHILON 2 0 FS 18 (SUTURE) IMPLANT
SUT ETHILON 2 0 PSLX (SUTURE) IMPLANT
SUT MON AB 3-0 SH27 (SUTURE) ×1 IMPLANT
SUT PDS AB 1 CT1 36 (SUTURE) IMPLANT
SUT VIC AB 0 CT1 18XCR BRD 8 (SUTURE) ×1 IMPLANT
SUT VIC AB 2-0 CT1 TAPERPNT 27 (SUTURE) ×1 IMPLANT
SYSTEM HUMERAL PERFORM  50X22 (Orthopedic Implant) IMPLANT
TOWEL GREEN STERILE (TOWEL DISPOSABLE) ×1 IMPLANT
TOWER CARTRIDGE SMART MIX (DISPOSABLE) IMPLANT
TOWER SMARTMIX MINI (MISCELLANEOUS) IMPLANT
TRAY FOLEY W/BAG SLVR 14FR (SET/KITS/TRAYS/PACK) IMPLANT
WATER STERILE IRR 1000ML POUR (IV SOLUTION) ×1 IMPLANT

## 2023-11-15 NOTE — Anesthesia Postprocedure Evaluation (Signed)
 Anesthesia Post Note  Patient: [name]  Procedure(s) Performed: HEMIARTHROPLASTY, SHOULDER (Left: Shoulder)     Patient location during evaluation: Nursing Unit Anesthesia Type: Regional and General Level of consciousness: awake and patient cooperative Pain management: pain level controlled Vital Signs Assessment: post-procedure vital signs reviewed and stable Respiratory status: spontaneous breathing, nonlabored ventilation, respiratory function stable and patient connected to nasal cannula oxygen  Cardiovascular status: blood pressure returned to baseline and stable Postop Assessment: no apparent nausea or vomiting Anesthetic complications: no   No notable events documented.  Last Vitals:  Vitals:   11/15/23 1200 11/15/23 1300  BP: 136/80   Pulse: 99 87  Resp: 18 17  Temp: 36.7 C   SpO2: 100% 99%    Last Pain:  Vitals:   11/15/23 1200  TempSrc: Oral  PainSc: 0-No pain                 Johaan Ryser

## 2023-11-15 NOTE — Anesthesia Preprocedure Evaluation (Signed)
 Anesthesia Evaluation  Patient identified by MRN, date of birth, ID band Patient awake    Reviewed: Allergy & Precautions, NPO status , Patient's Chart, lab work & pertinent test results  History of Anesthesia Complications Negative for: history of anesthetic complications  Airway Mallampati: III  TM Distance: >3 FB Neck ROM: Full    Dental  (+) Edentulous Upper, Dental Advisory Given,    Pulmonary asthma , COPD,  oxygen  dependent   breath sounds clear to auscultation       Cardiovascular hypertension, Pt. on medications and Pt. on home beta blockers (-) angina + CAD, +CHF and + DOE  (-) Past MI and (-) Cardiac Stents  Rhythm:Regular   1. Left ventricular ejection fraction, by estimation, is 55 to 60%. The  left ventricle has normal function. The left ventricle has no regional  wall motion abnormalities. Left ventricular diastolic parameters were  normal.   2. Right ventricular systolic function is normal. The right ventricular  size is normal.   3. The mitral valve is grossly normal. No evidence of mitral valve  regurgitation. No evidence of mitral stenosis.   4. The aortic valve was not well visualized. Aortic valve regurgitation  is not visualized. Aortic valve sclerosis is present, with no evidence of  aortic valve stenosis.   5. The inferior vena cava is normal in size with greater than 50%  respiratory variability, suggesting right atrial pressure of 3 mmHg.     Neuro/Psych  PSYCHIATRIC DISORDERS Anxiety Depression     Neuromuscular disease    GI/Hepatic Neg liver ROS,GERD  ,,  Endo/Other  diabetesHypothyroidism    Renal/GU CRFRenal diseaseLab Results      Component                Value               Date                      NA                       138                 11/15/2023                K                        3.4 (L)             11/15/2023                CO2                      27                   11/15/2023                GLUCOSE                  82                  11/15/2023                BUN                      11  11/15/2023                CREATININE               0.94                11/15/2023                CALCIUM                   7.5 (L)             11/15/2023                GFR                      56.76 (L)           10/15/2023                EGFR                     61                  09/29/2022                GFRNONAA                 >60                 11/15/2023                Musculoskeletal  (+) Arthritis ,    Abdominal   Peds  Hematology  (+) Blood dyscrasia, anemia Lab Results      Component                Value               Date                      WBC                      7.0                 11/15/2023                HGB                      7.5 (L)             11/15/2023                HCT                      25.8 (L)            11/15/2023                MCV                      94.9                11/15/2023                PLT                      160                 11/15/2023  Anesthesia Other Findings   Reproductive/Obstetrics                              Anesthesia Physical Anesthesia Plan  ASA: 3  Anesthesia Plan: General and Regional   Post-op Pain Management: Regional block*   Induction: Intravenous  PONV Risk Score and Plan: 3 and Ondansetron , Propofol  infusion and TIVA  Airway Management Planned: Oral ETT  Additional Equipment: None  Intra-op Plan:   Post-operative Plan: Extubation in OR  Informed Consent: I have reviewed the patients History and Physical, chart, labs and discussed the procedure including the risks, benefits and alternatives for the proposed anesthesia with the patient or authorized representative who has indicated his/her understanding and acceptance.     Dental advisory given  Plan Discussed with: CRNA  Anesthesia Plan Comments:           Anesthesia Quick Evaluation

## 2023-11-15 NOTE — Consult Note (Addendum)
 [facility]                                                                                        Patient Identification: Patient Name: [name] MRN: [id number] Admit Date: 11/13/2023 12:48 PM Today's Date: 11/15/2023 Reason for consult: Staphylococcus bacteremia Requesting provider: Sharyon auto consult  Principal Problem:   Septic arthritis [facility]) Active Problems:   Rheumatoid arthritis (HCC)   Debility   Chronic hypoxic respiratory failure (HCC)   Depression   Insulin  dependent type 2 diabetes mellitus (HCC)   Chronic chest pain   Chronic pain syndrome   Diabetic neuropathy (HCC)   Sinus tachycardia   History of pulmonary embolus (PE)   Hypothyroidism   Hyperlipidemia   Chronic diastolic CHF (congestive heart failure) (HCC)   GAD (generalized anxiety disorder)   SIRS (systemic inflammatory response syndrome) (HCC)   CAP (community acquired pneumonia)   Elevated troponin   History of GI bleed   History of CAD (coronary artery disease)   Stage IV pressure ulcer of sacral region (HCC)   GERD (gastroesophageal reflux disease)   Chronic constipation   Antibiotics:  Vancomycin  6/27- Cefepime  6/27- Metronidazole  6/28-  Lines/Hardware:  Assessment # MRSA bacteremia 2/2  # Left shoulder septic arthritis/osteomyelitis/Intramuscular abscess - 6/28 aspiration of LUE IM abscess collection.  Gram stain with GPC, staph aureus  - 6/20 OR today. OR cx with GPC - CT with patchy bilateral ground-glass opacities with bronchial wall thickening and mucous plugging in the lower lobes. Findings are concerning for pneumonia.  # RA - on prednisone , leflunomide  and plaquenil  at home. Only on prednisone  due to active infection   # Stage 3 Sacral wound - WOC consulted.   Recommendations  - Continue vancomycin , DC cefepime  and metronidazole  - 2 sets of repeat blood cultures ordered as  well as TTE - Fu OR note and cultures  - Monitor for metastatic sites of infection - Postop care per orthopedics - Monitor CBC BMP and vancomycin  trough - Maintain contact precautions New ID team to start following from 6/30   Rest of the management as per the primary team. Please call with questions or concerns.  Thank you for the consult  __________________________________________________________________________________________________________ HPI and Hospital Course: 83 year old female with prior history of GIB 2/2 sigmoid strictures s/p colectomy with colostomy in February 2025, PE not on [facility] due to GIB, hypothyroidism, dyslipidemia, CHF, COPD/chronic hypoxic respiratory failure on 3 to 4 L oxygen  at baseline, sacral stage II ulcer/left thigh pressure ulcer, morbid obesity, CAD, HTN, DM2, RA (on Plaquenil , Arava  and prednisone  at home) who presented to the ED on 6/27 with left arm pain, swelling for few days plus left chest pain.  Denies any fever, chills, nausea, vomiting abdominal pain or diarrhea.   At ED afebrile,  tachycardic, tachypneic, soft BP Labs remarkable for troponin 23>21, CRP 38.8, ESR 106, lactic acid 2.2, WBC 10.5 Was given IVF, IV morphine  and Zofran  Imagings as below and findings have been reviewed   EDP consulted orthopedics who recommended IR consult for joint aspiration of left upper extremity for further evaluation   ROS: General- Denies  fever, chills, loss of appetite and loss of weight HEENT - Denies headache, blurry vision, neck pain, sinus pain Chest - Denies any chest pain, SOB or cough CVS- Denies any dizziness/lightheadedness, syncopal attacks, palpitations Abdomen- Denies any nausea, vomiting, abdominal pain, hematochezia and diarrhea Neuro - Denies any weakness, numbness, tingling sensation Psych - Denies any changes in mood irritability or depressive symptoms GU- Denies any burning, dysuria, hematuria or increased frequency of urination Skin -  denies any rashes/lesions MSK - left shoulder pain  Past Medical History:  Diagnosis Date   Acute encephalopathy 03/30/2021   11/12 - 04/03/2021 acute encephalopathy in the context of altered mental status and poor oral intake following increasing pain medicines and associated Klebsiella UTI.     Acute kidney injury superimposed on chronic kidney disease (HCC) 04/17/2015   11/12 - 04/03/2021 AKI with creatinine of 2.22 and GFR 22 indicating stage IV renal disease in the context of encephalopathy secondary to increased pain medications with decreased oral intake of fluids and nutrition.     Acute on chronic diastolic (congestive) heart failure (HCC)    Anemia    iron  deficiency anemia - secondary to blood loss ( chronic)    Anxiety    Arthritis    endstage changes bilateral knees/bilateral ankles.    Asthma    Carotid artery occlusion    Chest pain of uncertain etiology 01/28/2012   Normal coronaries at cath     Chronic fatigue    Chronic kidney disease    Closed intertrochanteric fracture of hip, left, initial encounter (HCC) 11/29/2016   Closed left hip fracture (HCC)    Closed nondisplaced intertrochanteric fracture of left femur with delayed healing 01/14/2017   Clotting disorder (HCC)    pt denies this   Contusion of left knee    due to fall 1/14.   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     Morbid obesity (HCC)    Neuromuscular disorder (HCC)    diabetic neuropathy     Normal coronary arteries    cardiac catheterization performed  10/31/14   Pneumonia    RA (rheumatoid arthritis) (HCC)    has been off methotreaxte since 10/13.   Septic shock (HCC) 06/13/2023   Spinal stenosis of lumbar region    Thyroid  disease    Unspecified lack of coordination    URI (upper respiratory infection)    UTI (urinary tract infection) 03/31/2021   11/12 - 04/03/2021 Klebsiella UTI associated with encephalopathy in the context of AKI.  Status post IV ceftriaxone  followed by oral Keflex  x48 hours.     Past Surgical History:  Procedure Laterality Date   BRAIN SURGERY     Gamma knife 10/13. Needs repeat spring  '14   CARDIAC CATHETERIZATION N/A 10/31/2014   Procedure: Right/Left Heart Cath and Coronary Angiography;  Surgeon: Candyce GORMAN Reek, MD;  Location: [facility] INVASIVE CV  LAB;  Service: Cardiovascular;  Laterality: N/A;   COLONOSCOPY WITH PROPOFOL  N/A 04/14/2022   Procedure: COLONOSCOPY WITH PROPOFOL ;  Surgeon: Albertus Gordy HERO, MD;  Location: [facility] ENDOSCOPY;  Service: Gastroenterology;  Laterality: N/A;   CYST EXCISION     2 on face   CYST REMOVAL NECK     ESOPHAGOGASTRODUODENOSCOPY (EGD) WITH PROPOFOL  N/A 09/14/2014   Procedure: ESOPHAGOGASTRODUODENOSCOPY (EGD) WITH PROPOFOL ;  Surgeon: Lamar JONETTA Aho, MD;  Location: [facility] ENDOSCOPY;  Service: Endoscopy;  Laterality: N/A;   INCISION AND DRAINAGE HIP Left 01/16/2017   Procedure: IRRIGATION AND DEBRIDEMENT LEFT HIP;  Surgeon: Vernetta Lonni GRADE, MD;  Location: [facility] ORS;  Service: Orthopedics;  Laterality: Left;   INTRAMEDULLARY (IM) NAIL INTERTROCHANTERIC Left 11/29/2016   Procedure: INTRAMEDULLARY (IM) NAIL INTERTROCHANTRIC;  Surgeon: Vernetta Lonni GRADE, MD;  Location: [facility] OR;  Service: Orthopedics;  Laterality: Left;   LAPAROSCOPIC LOOP COLOSTOMY N/A 07/17/2023   Procedure: LAPAROSCOPIC DIVERTING LOOP COLOSTOMY;  Surgeon: Belinda Cough, MD;  Location: [facility] OR;  Service: General;  Laterality: N/A;   OVARY SURGERY      RIGHT HEART CATH N/A 08/12/2021   Procedure: RIGHT HEART CATH;  Surgeon: Court Dorn PARAS, MD;  Location[facility] INVASIVE CV LAB;  Service: Cardiovascular;  Laterality: N/A;   SHOULDER SURGERY Left    TONSILLECTOMY  age 9   VAGINAL HYSTERECTOMY     VIDEO BRONCHOSCOPY Bilateral 05/31/2013   Procedure: VIDEO BRONCHOSCOPY WITHOUT FLUORO;  Surgeon: Dorethia Cave, MD;  Location: [facility] ENDOSCOPY;  Service: Cardiopulmonary;  Laterality: Bilateral;   video bronscoscopy  05/19/1998   lung    Scheduled Meds:  atorvastatin   10 mg Oral Daily   cholecalciferol   1,000 Units Oral Daily   DULoxetine   60 mg Oral Daily   furosemide   40 mg Oral Daily   gabapentin   300 mg Oral BID   guaiFENesin   1,200 mg Oral BID   heparin   5,000 Units Subcutaneous Q8H   insulin  aspart  0-5 Units Subcutaneous QHS   insulin  aspart  0-9 Units Subcutaneous TID WC   levothyroxine   50 mcg Oral Q0600   lidocaine  (PF)  8 mL Intradermal Once   metoprolol  tartrate  25 mg Oral BID   morphine   15 mg Oral Q12H   pantoprazole   40 mg Oral BID   predniSONE   10 mg Oral Q breakfast   sodium chloride  flush  3 mL Intravenous Q12H   sodium chloride  flush  3 mL Intravenous Q12H   Continuous Infusions:   ceFAZolin  (ANCEF ) IV     ceFEPime  (MAXIPIME ) IV Stopped (11/14/23 2155)   metronidazole  500 mg (11/15/23 0102)   tranexamic acid     vancomycin      PRN Meds:.acetaminophen , bisacodyl , hyoscyamine , ipratropium-albuterol , LORazepam , methocarbamol , ondansetron  (ZOFRAN ) IV, polyethylene glycol, senna, sodium chloride  flush  Allergies  Allergen Reactions   Codeine Swelling and Other (See Comments)    Facial and leg swelling Chest pain    Remicade [Infliximab] Anaphylaxis    sent me into shock    Zestril [Lisinopril] Swelling and Rash    Face and neck swelling    Ofev  [Nintedanib] Diarrhea   Social History   Socioeconomic History   Marital status: Married    Spouse name: Not on file   Number of children: 6   Years of  education: college   Highest education level: Bachelor's degree (e.g., BA, AB, BS)  Occupational History   Occupation: retired Engineer, civil (consulting)  Tobacco Use   Smoking status: Never   Smokeless tobacco: Never  Vaping Use  Vaping status: Never Used  Substance and Sexual Activity   Alcohol  use: No   Drug use: No   Sexual activity: Not Currently  Other Topics Concern   Not on file  Social History Narrative   Patient consumes 2-3 cups coffee per day.   Social Drivers of Corporate investment banker Strain: Low Risk  (10/30/2023)   Overall Financial Resource Strain (CARDIA)    Difficulty of Paying Living Expenses: Not hard at all  Food Insecurity: No Food Insecurity (11/14/2023)   Hunger Vital Sign    Worried About Running Out of Food in the Last Year: Never true    Ran Out of Food in the Last Year: Never true  Transportation Needs: No Transportation Needs (11/14/2023)   PRAPARE - Administrator, Civil Service (Medical): No    Lack of Transportation (Non-Medical): No  Physical Activity: Insufficiently Active (10/30/2023)   Exercise Vital Sign    Days of Exercise per Week: 5 days    Minutes of Exercise per Session: 20 min  Stress: No Stress Concern Present (10/30/2023)   [employer] of Occupational Health - Occupational Stress Questionnaire    Feeling of Stress: Not at all  Social Connections: Socially Integrated (11/14/2023)   Social Connection and Isolation Panel    Frequency of Communication with Friends and Family: More than three times a week    Frequency of Social Gatherings with Friends and Family: More than three times a week    Attends Religious Services: More than 4 times per year    Active Member of [employer] or Organizations: Yes    Attends Engineer, structural: More than 4 times per year    Marital Status: Married  Catering manager Violence: Not At Risk (11/14/2023)   Humiliation, Afraid, Rape, and Kick questionnaire    Fear of Current or Ex-Partner: No     Emotionally Abused: No    Physically Abused: No    Sexually Abused: No   Family History  Problem Relation Age of Onset   Diabetes Mother    Heart attack Mother    Hypertension Father    Lung cancer Father    Diabetes Sister    Breast cancer Sister    Breast cancer Sister    Diabetes Brother    Hypertension Brother    Heart disease Brother    Heart attack Brother    Kidney cancer Brother    Gout Brother    Kidney failure Brother        x 5   Esophageal cancer Brother    Stomach cancer Brother    Prostate cancer Brother    Uterine cancer Daughter        with mets   Fibroids Daughter    Hypertension Daughter    Hypertension Daughter    Hypertension Son    Heart murmur Son    Rheum arthritis Maternal Uncle    Alzheimer's disease Neg Hx    Dementia Neg Hx     Vitals BP 134/79 (BP Location: Right Wrist)   Pulse (!) 110   Temp 98.5 F (36.9 C) (Oral)   Resp 14   Ht 5' 4 (1.626 m)   Wt 90.3 kg   SpO2 97%   BMI 34.16 kg/m    Physical Exam Constitutional: Elderly female lying in the bed, seen after OR    Comments: HEENT WNL  Cardiovascular:     Rate and Rhythm: Normal rate and regular rhythm.  Heart sounds: S1 and S2  Pulmonary:     Effort: Pulmonary effort is normal.     Comments: Normal breath sounds  Abdominal:     Palpations: Abdomen is soft.     Tenderness: Nondistended and nontender, has a colostomy  Musculoskeletal:        General: No swelling or tenderness.  No signs of septic joint.  Left shoulder and left upper extremity in a postsurgical bandage C/D/I  Skin:    Comments: No rashes  Neurological:     General: Awake alert and oriented, grossly nonfocal  Psychiatric:        Mood and Affect: Mood normal.    Pertinent Microbiology Results for orders placed or performed during the hospital encounter of 11/13/23  Body fluid culture w Gram Stain     Status: None (Preliminary result)   Collection Time: 11/13/23  9:22 PM   Specimen:  Abscess; Body Fluid  Result Value Ref Range Status   Specimen Description ABSCESS  Final   Special Requests JOINT,LEFT SHOULDER  Final   Gram Stain   Final    MODERATE WBC PRESENT,BOTH PMN AND MONONUCLEAR RARE GRAM POSITIVE COCCI Performed at [facility], [address], [place], [place] [address]    Culture PENDING  Incomplete   Report Status PENDING  Incomplete  Anaerobic culture w Gram Stain     Status: None (Preliminary result)   Collection Time: 11/13/23  9:22 PM   Specimen: Abscess  Result Value Ref Range Status   Specimen Description ABSCESS  Final   Special Requests JOINT,LEFT SHOULDER  Final   Gram Stain   Final    MODERATE WBC PRESENT,BOTH PMN AND MONONUCLEAR RARE GRAM POSITIVE COCCI Performed at [facility], [address], [place], [place] [address]    Culture PENDING  Incomplete   Report Status PENDING  Incomplete  Culture, blood (Routine X 2) w Reflex to ID Panel     Status: None (Preliminary result)   Collection Time: 11/13/23 11:31 PM   Specimen: BLOOD RIGHT HAND  Result Value Ref Range Status   Specimen Description BLOOD RIGHT HAND  Final   Special Requests   Final    BOTTLES DRAWN AEROBIC ONLY Blood Culture results may not be optimal due to an inadequate volume of blood received in culture bottles   Culture  Setup Time   Final    GRAM POSITIVE COCCI AEROBIC BOTTLE ONLY CRITICAL RESULT CALLED TO, READ BACK BY AND VERIFIED WITH: PHARMD S WISE 11/14/2023 @ 2132 BY AB Performed at [facility], [address], [place], [place] [address]    Culture GRAM POSITIVE COCCI  Final   Report Status PENDING  Incomplete  Blood Culture ID Panel (Reflexed)     Status: Abnormal   Collection Time: 11/13/23 11:31 PM  Result Value Ref Range Status   Enterococcus faecalis NOT DETECTED NOT DETECTED Final   Enterococcus Faecium NOT DETECTED NOT DETECTED Final   Listeria monocytogenes NOT DETECTED NOT DETECTED Final   Staphylococcus species DETECTED (A) NOT  DETECTED Final    Comment: CRITICAL RESULT CALLED TO, READ BACK BY AND VERIFIED WITH: PHARMD S WISE 11/14/2023 @ 2132 BY AB    Staphylococcus aureus (BCID) DETECTED (A) NOT DETECTED Final    Comment: Methicillin (oxacillin)-resistant Staphylococcus aureus (MRSA). MRSA is predictably resistant to beta-lactam antibiotics (except ceftaroline). Preferred therapy is vancomycin  unless clinically contraindicated. Patient requires contact precautions if  hospitalized. CRITICAL RESULT CALLED TO, READ BACK BY AND  VERIFIED WITH: PHARMD S WISE 11/14/2023 @ 2132 BY AB    Staphylococcus epidermidis NOT DETECTED NOT DETECTED Final   Staphylococcus lugdunensis NOT DETECTED NOT DETECTED Final   Streptococcus species NOT DETECTED NOT DETECTED Final   Streptococcus agalactiae NOT DETECTED NOT DETECTED Final   Streptococcus pneumoniae NOT DETECTED NOT DETECTED Final   Streptococcus pyogenes NOT DETECTED NOT DETECTED Final   A.calcoaceticus-baumannii NOT DETECTED NOT DETECTED Final   Bacteroides fragilis NOT DETECTED NOT DETECTED Final   Enterobacterales NOT DETECTED NOT DETECTED Final   Enterobacter cloacae complex NOT DETECTED NOT DETECTED Final   Escherichia coli NOT DETECTED NOT DETECTED Final   Klebsiella aerogenes NOT DETECTED NOT DETECTED Final   Klebsiella oxytoca NOT DETECTED NOT DETECTED Final   Klebsiella pneumoniae NOT DETECTED NOT DETECTED Final   Proteus species NOT DETECTED NOT DETECTED Final   Salmonella species NOT DETECTED NOT DETECTED Final   Serratia marcescens NOT DETECTED NOT DETECTED Final   Haemophilus influenzae NOT DETECTED NOT DETECTED Final   Neisseria meningitidis NOT DETECTED NOT DETECTED Final   Pseudomonas aeruginosa NOT DETECTED NOT DETECTED Final   Stenotrophomonas maltophilia NOT DETECTED NOT DETECTED Final   Candida albicans NOT DETECTED NOT DETECTED Final   Candida auris NOT DETECTED NOT DETECTED Final   Candida glabrata NOT DETECTED NOT DETECTED Final   Candida krusei  NOT DETECTED NOT DETECTED Final   Candida parapsilosis NOT DETECTED NOT DETECTED Final   Candida tropicalis NOT DETECTED NOT DETECTED Final   Cryptococcus neoformans/gattii NOT DETECTED NOT DETECTED Final   Meth resistant mecA/C and MREJ DETECTED (A) NOT DETECTED Final    Comment: CRITICAL RESULT CALLED TO, READ BACK BY AND VERIFIED WITH: PHARMD S WISE 11/14/2023 @ 2132 BY AB Performed at [facility], [address], [place], [place] [address]   Culture, blood (Routine X 2) w Reflex to ID Panel     Status: None (Preliminary result)   Collection Time: 11/13/23 11:48 PM   Specimen: BLOOD RIGHT FOREARM  Result Value Ref Range Status   Specimen Description BLOOD RIGHT FOREARM  Final   Special Requests   Final    BOTTLES DRAWN AEROBIC ONLY Blood Culture results may not be optimal due to an inadequate volume of blood received in culture bottles   Culture  Setup Time   Final    GRAM POSITIVE COCCI IN CLUSTERS AEROBIC BOTTLE ONLY CRITICAL VALUE NOTED.  VALUE IS CONSISTENT WITH PREVIOUSLY REPORTED AND CALLED VALUE. Performed at [facility], [address], [place], [place] [address]    Culture GRAM POSITIVE COCCI  Final   Report Status PENDING  Incomplete  Respiratory (~20 pathogens) panel by PCR     Status: None   Collection Time: 11/14/23  5:12 AM   Specimen: Nasopharyngeal Swab; Respiratory  Result Value Ref Range Status   Adenovirus NOT DETECTED NOT DETECTED Final   Coronavirus 229E NOT DETECTED NOT DETECTED Final    Comment: (NOTE) The Coronavirus on the Respiratory Panel, DOES NOT test for the novel  Coronavirus (2019 nCoV)    Coronavirus HKU1 NOT DETECTED NOT DETECTED Final   Coronavirus NL63 NOT DETECTED NOT DETECTED Final   Coronavirus OC43 NOT DETECTED NOT DETECTED Final   Metapneumovirus NOT DETECTED NOT DETECTED Final   Rhinovirus / Enterovirus NOT DETECTED NOT DETECTED Final   Influenza A NOT DETECTED NOT DETECTED Final   Influenza B NOT DETECTED NOT DETECTED  Final   Parainfluenza Virus 1 NOT DETECTED NOT DETECTED Final  Parainfluenza Virus 2 NOT DETECTED NOT DETECTED Final   Parainfluenza Virus 3 NOT DETECTED NOT DETECTED Final   Parainfluenza Virus 4 NOT DETECTED NOT DETECTED Final   Respiratory Syncytial Virus NOT DETECTED NOT DETECTED Final   Bordetella pertussis NOT DETECTED NOT DETECTED Final   Bordetella Parapertussis NOT DETECTED NOT DETECTED Final   Chlamydophila pneumoniae NOT DETECTED NOT DETECTED Final   Mycoplasma pneumoniae NOT DETECTED NOT DETECTED Final    Comment: Performed at [facility], [address], [place], [place] [address]  Aerobic/Anaerobic Culture w Gram Stain (surgical/deep wound)     Status: None (Preliminary result)   Collection Time: 11/14/23 11:05 AM   Specimen: Abscess  Result Value Ref Range Status   Specimen Description ABSCESS  Final   Special Requests ABSCESS  Final   Gram Stain   Final    ABUNDANT WBC PRESENT,BOTH PMN AND MONONUCLEAR RARE GRAM POSITIVE COCCI Performed at [facility], [address], [place], [place] [address]    Culture PENDING  Incomplete   Report Status PENDING  Incomplete   *Note: Due to a large number of results and/or encounters for the requested time period, some results have not been displayed. A complete set of results can be found in Results Review.   Pertinent Lab seen by me:    Latest Ref Rng & Units 11/15/2023    2:41 AM 11/14/2023    5:12 AM 11/13/2023    1:01 PM  CBC  WBC 4.0 - 10.5 K/uL 7.0  11.2  10.5   Hemoglobin 12.0 - 15.0 g/dL 7.5  8.5  8.9   Hematocrit 36.0 - 46.0 % 25.8  29.7  30.7   Platelets 150 - 400 K/uL 160  164  153       Latest Ref Rng & Units 11/15/2023    2:41 AM 11/14/2023    5:12 AM 11/13/2023    1:01 PM  CMP  Glucose 70 - 99 mg/dL 82  88  847   BUN 8 - 23 mg/dL 11  14  16    Creatinine 0.44 - 1.00 mg/dL 9.05  8.99  9.04   Sodium 135 - 145 mmol/L 138  142  139   Potassium 3.5 - 5.1 mmol/L 3.4  3.9  4.1   Chloride 98 - 111  mmol/L 103  103  100   CO2 22 - 32 mmol/L 27  29  28    Calcium  8.9 - 10.3 mg/dL 7.5  8.1  8.0   Total Protein 6.5 - 8.1 g/dL  5.4    Total Bilirubin 0.0 - 1.2 mg/dL  0.9    Alkaline Phos 38 - 126 U/L  129    AST 15 - 41 U/L  19    ALT 0 - 44 U/L  22       Pertinent Imagings/Other Imagings Plain films and CT images have been personally visualized and interpreted; radiology reports have been reviewed. Decision making incorporated into the Impression / Recommendations.  UE Venous Duplex ([facility] and [facility] ONLY) Result Date: 11/14/2023 UPPER VENOUS STUDY  Patient Name:  VARETTA CHAVERS  Date of Exam:   11/13/2023 Medical Rec #: [id number]          Accession #:    [id number] Date of Birth: [birth year]          Patient Gender: F Patient Age:   78 years Exam Location:  [facility] Procedure:      VAS US  UPPER EXTREMITY VENOUS DUPLEX  Referring Phys: PRENTICE TEE --------------------------------------------------------------------------------  Indications: Pain, and Swelling Performing Technologist: Jody Hill RVT, RDMS  Examination Guidelines: A complete evaluation includes B-mode imaging, spectral Doppler, color Doppler, and power Doppler as needed of all accessible portions of each vessel. Bilateral testing is considered an integral part of a complete examination. Limited examinations for reoccurring indications may be performed as noted.  Left Findings: +----------+------------+---------+-----------+----------+---------------------+ LEFT      CompressiblePhasicitySpontaneousProperties       Summary        +----------+------------+---------+-----------+----------+---------------------+ IJV           Full       Yes       Yes                                    +----------+------------+---------+-----------+----------+---------------------+ Subclavian    Full       Yes       Yes                                    +----------+------------+---------+-----------+----------+---------------------+  Axillary      Full       Yes       Yes                                    +----------+------------+---------+-----------+----------+---------------------+ Brachial                 Yes       Yes               not well visualized                                                            patent by                                                               color/doppler     +----------+------------+---------+-----------+----------+---------------------+ Radial        Full                                                        +----------+------------+---------+-----------+----------+---------------------+ Ulnar         Full                                   not well visualized  +----------+------------+---------+-----------+----------+---------------------+ Cephalic      Full                                                        +----------+------------+---------+-----------+----------+---------------------+  Basilic       Full       Yes       Yes                                    +----------+------------+---------+-----------+----------+---------------------+ Large non-vascularized heterogenous structure seen in left upper arm (11.5 x 4.1 x 5.10).  Summary:  Right: No evidence of thrombosis in the subclavian.  Left: No evidence of deep vein thrombosis in the upper extremity. No evidence of superficial vein thrombosis in the upper extremity. Large non-vascularized heterogenous structure seen in left upper arm (11.5 x 4.1 x 5.10).  *See table(s) above for measurements and observations.  Diagnosing physician: Norman Serve Electronically signed by Norman Serve on 11/14/2023 at 3:46:41 PM.    Final    US  FNA SOFT TISSUE Result Date: 11/14/2023 INDICATION: 10220 Abscess [id number] EXAM: US -GUIDED DIAGNOSTIC ASPIRATION OF A LEFT UPPER EXTREMITY SUBCUTANEOUS FLUID COLLECTION COMPARISON:  CTA LEFT UPPER EXTREMITY, 11/13/2023. MR SHOULDER, 11/14/2023 MEDICATIONS: The  patient is currently admitted to the hospital and receiving intravenous antibiotics. The antibiotics were administered within an appropriate time frame prior to the initiation of the procedure. ANESTHESIA/SEDATION: Local anesthetic was administered. CONTRAST:  None COMPLICATIONS: None immediate. PROCEDURE: Informed written consent was obtained from the patient after a discussion of the risks, benefits and alternatives to treatment. Preprocedural ultrasound scanning demonstrated a large heterogeneously dense, anechoic subcutaneous fluid collection. A timeout was performed prior to the initiation of the procedure. The proximal LEFT upper extremity was prepped and draped in the usual sterile fashion. The overlying soft tissues were anesthetized with 1% lidocaine  with epinephrine . Under direct ultrasound guidance, a 18 gauge trocar needle was advanced into the abscess/fluid collection. Multiple ultrasound images were saved for procedural documentation purposes. Next, approximately 45 mL of purulent fluid was aspirated from the collection. A representative sample of aspirated fluid was capped and sent to the laboratory for analysis. The needle was removed and superficial hemostasis was achieved with manual compression. A dressing was placed. The patient tolerated the procedure well without immediate postprocedural complication. IMPRESSION: Successful US  guided of 45 mL of purulent fluid from the LEFT upper extremity subcutaneous fluid collection. A representative aspirated sample was sent to the laboratory as requested by the ordering clinical team. Thom Hall, MD [facility] Electronically Signed   By: Thom Hall M.D.   On: 11/14/2023 15:37   MR SHOULDER LEFT W WO CONTRAST Result Date: 11/14/2023 CLINICAL DATA:  Concern for septic arthritis of the left shoulder. EXAM: MRI OF THE LEFT SHOULDER WITHOUT AND WITH CONTRAST TECHNIQUE: Multiplanar, multisequence  MR imaging of the left shoulder was performed before and after the administration of intravenous contrast. CONTRAST:  9mL GADAVIST GADOBUTROL 1 MMOL/ML IV SOLN COMPARISON:  CTA of the left upper extremity dated 11/13/2023. FINDINGS: Glenohumeral Joint: Large complex glenohumeral joint effusion measuring approximately 8.6 cm AP by 4.5 cm TR with synovitis and thickened peripheral enhancement, concerning for septic arthritis. This complex fluid likely communicates with and decompresses into the biceps tendon sheath which demonstrates complex fluid distension and surrounding thick peripheral enhancement, concerning for infectious tenosynovitis, which extends distally beyond the field of view with surrounding intramuscular edema which could reflect myositis (series 3, image 18 and series 9, image 18). There is surrounding soft tissue edema and enhancement, concerning for cellulitis. Glenohumeral joint remains aligned. Rotator cuff: Moderate to severe fatty atrophy of the  supraspinatus and infraspinatus muscles and to a lesser degree the subscapularis muscle. High-grade to near complete retracted insertional tears of the supraspinatus and subscapularis tendons. Infraspinatus tendinosis with partial-thickness articular sided insertional tear. Teres minor tendon is intact. Biceps Long Head: Intraarticular biceps tendon is not clearly identified and likely torn and retracted. Acromioclavicular Joint: Mild-to-moderate arthropathy of the acromioclavicular joint. Fluid within the subacromial/subdeltoid bursa, likely secondary to glenohumeral effusion extending through full-thickness rotator cuff tears. Labrum: Diffuse labral degeneration with tearing. Bones: Large complex glenohumeral joint effusion, as described above, with periarticular edema and enhancement of the glenoid and humeral head, concerning for septic arthritis/osteomyelitis. No definite acute fracture identified. IMPRESSION: 1. Large complex glenohumeral joint  effusion with synovitis, thickened peripheral enhancement, and periarticular edema/enhancement of the glenoid and humeral head. These findings are concerning for septic arthritis/osteomyelitis. Edema and enhancement of the surrounding rotator cuff musculature is suspicious for myositis. The complex glenohumeral effusion likely communicates with and decompresses into the biceps tendon sheath which demonstrates complex fluid distension with surrounding thick peripheral enhancement, concerning for infectious tenosynovitis extending distally beyond the field of view with surrounding intramuscular edema of the biceps, concerning for myositis. 2. High-grade to near complete retracted insertional tears of the supraspinatus and subscapularis tendons. Infraspinatus tendinosis with partial-thickness articular sided insertional tear. Moderate to severe fatty atrophy of the supraspinatus and infraspinatus muscles and to a lesser degree the subscapularis muscle. 3. Intra-articular biceps tendon is not clearly identified and likely torn and retracted. 4. Diffuse labral degeneration with tearing. 5. Moderate glenohumeral joint arthritis. Electronically Signed   By: Harrietta Sherry M.D.   On: 11/14/2023 13:37   CT ANGIO UP EXTREM LEFT W &/OR WO CONTAST Result Date: 11/13/2023 CLINICAL DATA:  Abnormal ultrasound, blood clot in arm EXAM: CT ANGIOGRAPHY OF LEFT UPPER EXTREMITY TECHNIQUE: Multidetector CT imaging of the LEFT UPPER EXTREMITY was performed using the standard protocol during bolus administration of intravenous contrast. Multiplanar CT image reconstructions and MIPs were obtained to evaluate the vascular anatomy. RADIATION DOSE REDUCTION: This exam was performed according to the departmental dose-optimization program which includes automated exposure control, adjustment of the mA and/or kV according to patient size and/or use of iterative reconstruction technique. CONTRAST:  OMNIPAQUE  IOHEXOL  350 MG/ML SOLN  COMPARISON:  None Available. FINDINGS: VASCULAR Subclavian artery: Patent where visualized. No acute luminal abnormality. No aneurysm or ectasia. Axillary artery: Widely patent. No acute luminal abnormality. No aneurysm or ectasia. Brachial artery: Widely patent. No acute luminal abnormality. No aneurysm or ectasia. Radial and ulnar arteries: Not well visualized Veins: Not well opacified. Nonvascular: Nondisplaced fracture of the posterosuperior glenoid large loculated peripherally enhancing fluid collection extending from the left glenohumeral joint into the proximal and mid biceps musculature. This measures approximately 5.3 x 3.6 x 17.5 cm. Adjacent intramuscular and subcutaneous edema throughout the left arm. There is loss of cortical definition about the portions of the superior and posterior humeral head (circa series 8/image 82 and 6/37). IMPRESSION: 1. Nondisplaced fracture of the posterosuperior glenoid. 2. Large loculated peripherally enhancing fluid collection extending from the left glenohumeral joint into the proximal and mid biceps musculature. This measures approximately 5.3 x 3.6 x 17.5 cm. Findings are concerning for septic arthritis and intramuscular abscess. 3. Question developing humeral head osteomyelitis. 4. The radial and ulnar arteries are not well visualized. The subclavian, axillary, and brachial arteries are widely patent. Electronically Signed   By: Norman Gatlin M.D.   On: 11/13/2023 20:09   CT Angio Chest PE W and/or Wo Contrast  Result Date: 11/13/2023 CLINICAL DATA:  Blood clot in arm, arm pain. Evaluate for pulmonary embolism. EXAM: CT ANGIOGRAPHY CHEST WITH CONTRAST TECHNIQUE: Multidetector CT imaging of the chest was performed using the standard protocol during bolus administration of intravenous contrast. Multiplanar CT image reconstructions and MIPs were obtained to evaluate the vascular anatomy. RADIATION DOSE REDUCTION: This exam was performed according to the departmental  dose-optimization program which includes automated exposure control, adjustment of the mA and/or kV according to patient size and/or use of iterative reconstruction technique. CONTRAST:  OMNIPAQUE  IOHEXOL  350 MG/ML SOLN COMPARISON:  CT chest 08/13/2023 FINDINGS: Cardiovascular: Negative for pulmonary embolism. Coronary artery and aortic atherosclerotic calcification. Normal caliber thoracic aorta. No pericardial effusion. Mediastinum/Nodes: Trachea and esophagus are unremarkable. No thoracic adenopathy. Lungs/Pleura: Patchy bilateral ground-glass opacities. Bronchial wall thickening and mucous plugging in the lower lobes. Left greater than right lower lobe atelectasis or pneumonia. Trace left pleural effusion. No pneumothorax. Upper Abdomen: No acute abnormality. Musculoskeletal: Suspected nondisplaced fracture of the superior left glenoid fossa (series 9/image 67). Peripherally enhancing left glenohumeral joint effusion. If there is concern for septic arthritis fluid sampling is recommended. Review of the MIP images confirms the above findings. IMPRESSION: 1. Negative for pulmonary embolism. 2. Patchy bilateral ground-glass opacities with bronchial wall thickening and mucous plugging in the lower lobes. Findings are concerning for pneumonia. 3. Nondisplaced fracture of the superior left glenoid fossa. Peripherally enhancing left glenohumeral joint effusion. Septic arthritis is not excluded. Consider joint aspiration. 4. Aortic Atherosclerosis (ICD10-I70.0). Electronically Signed   By: Norman Gatlin M.D.   On: 11/13/2023 19:57    I spent 85 minutes involved in face-to-face and non-face-to-face activities for this patient on the day of the visit. Professional time spent includes the following activities: Preparing to see the patient (review of tests), Obtaining and reviewing separately obtained history (admission and ED record), Performing a medically appropriate examination and evaluation , Ordering  medications, referring and communicating with other health care professionals, Documenting clinical information in the EMR, Independently interpreting results (not separately reported), Communicating results to the patient, Counseling and educating the patient and Care coordination (not separately reported).  Electronically signed by:   Plan d/w requesting provider as well as ID pharm D  Of note, portions of this note may have been created with voice recognition software. While this note has been edited for accuracy, occasional wrong-word or 'sound-a-like' substitutions may have occurred due to the inherent limitations of voice recognition software.   Annalee Orem, MD Infectious Disease Physician [facility] Pager: [phone]  `

## 2023-11-15 NOTE — Transfer of Care (Signed)
 Immediate Anesthesia Transfer of Care Note  Patient: Aleina F [employer]  Procedure(s) Performed: HEMIARTHROPLASTY, SHOULDER (Left: Shoulder)  Patient Location: PACU  Anesthesia Type:General  Level of Consciousness: awake  Airway & Oxygen  Therapy: Patient Spontanous Breathing and Patient connected to nasal cannula oxygen   Post-op Assessment: Report given to RN and Post -op Vital signs reviewed and stable  Post vital signs: Reviewed and stable  Last Vitals:  Vitals Value Taken Time  BP 102/64 11/15/23 10:08  Temp    Pulse 104 11/15/23 10:11  Resp 17 11/15/23 10:11  SpO2 98 % 11/15/23 10:11  Vitals shown include unfiled device data.  Last Pain:  Vitals:   11/15/23 0750  TempSrc: Oral  PainSc:          Complications: No notable events documented.

## 2023-11-15 NOTE — Anesthesia Procedure Notes (Signed)
 Procedure Name: Intubation Date/Time: 11/15/2023 8:32 AM  Performed by: Virgil Ee, CRNAPre-anesthesia Checklist: Patient identified, Patient being monitored, Timeout performed, Emergency Drugs available and Suction available Patient Re-evaluated:Patient Re-evaluated prior to induction Oxygen  Delivery Method: Circle system utilized Preoxygenation: Pre-oxygenation with 100% oxygen  Induction Type: IV induction Ventilation: Mask ventilation without difficulty Laryngoscope Size: Mac and 4 Grade View: Grade I Tube type: Oral Tube size: 7.0 mm Number of attempts: 1 Airway Equipment and Method: Stylet Placement Confirmation: ETT inserted through vocal cords under direct vision, positive ETCO2 and breath sounds checked- equal and bilateral Secured at: 22 cm Tube secured with: Tape Dental Injury: Teeth and Oropharynx as per pre-operative assessment

## 2023-11-15 NOTE — Progress Notes (Signed)
 PT Cancellation Note  Patient Details Name: AVON MOLOCK MRN: [id number] DOB: [birth year]   Cancelled Treatment:    Reason Eval/Treat Not Completed: Patient at procedure or test/unavailable (Pt currently off floor for hemiarthroplasty. Will continue to follow up as able and appropriate.)  Dorothyann Maier, DPT, CLT  Acute Rehabilitation Services Office: [phone] (Secure chat preferred)   Dorothyann VEAR Maier 11/15/2023, 9:04 AM

## 2023-11-15 NOTE — Interval H&P Note (Signed)
 All questions answered. Plan for hemiarthroplasty and washout. Patient understands there may be more than one procedure.

## 2023-11-15 NOTE — Anesthesia Procedure Notes (Signed)
 Anesthesia Regional Block: Interscalene brachial plexus block   Pre-Anesthetic Checklist: , timeout performed,  Correct Patient, Correct Site, Correct Laterality,  Correct Procedure, Correct Position, site marked,  Risks and benefits discussed,  Surgical consent,  Pre-op evaluation,  At surgeon's request and post-op pain management  Laterality: Left and Upper  Prep: chloraprep       Needles:  Injection technique: Single-shot      Needle Length: 5cm  Needle Gauge: 22     Additional Needles: Arrow StimuQuik ECHO Echogenic Stimulating PNB Needle  Procedures:,,,, ultrasound used (permanent image in chart),,    Narrative:  Start time: 11/15/2023 7:48 AM End time: 11/15/2023 7:54 AM Injection made incrementally with aspirations every 5 mL.  Performed by: Personally  Anesthesiologist: Leopoldo Bruckner, MD

## 2023-11-15 NOTE — Progress Notes (Signed)
 OT Cancellation Note  Patient Details Name: [name] MRN: [id number] DOB: [birth year]   Cancelled Treatment:    Reason Eval/Treat Not Completed: Patient at procedure or test/ unavailable. Pt off unit for scheduled hemiarthroplasty. Will return post-op.  Lafreda Casebeer C, OT  Acute Rehabilitation Services Office [phone] Secure chat preferred   Adrianne GORMAN Savers 11/15/2023, 7:51 AM

## 2023-11-15 NOTE — Progress Notes (Addendum)
 PROGRESS NOTE  [name]:[id number] DOB: [birth year]   PCP: Mercer Clotilda SAUNDERS, MD  Patient is from: Home.  DOA: 11/13/2023 LOS: 2  Chief complaints Chief Complaint  Patient presents with   blood clot in arm    Arm Pain     Brief Narrative / Interim history: 83 year old F with PMH of GIB due to sigmoid stricture s/p colectomy and colostomy creation in 06/2023, PE not on anticoagulation due to GIB, nonobstructive CAD, chronic chest pain, COPD, chronic hypoxic RF on 3 L, HFpEF, IDDM-2, rheumatoid arthritis, stage II sacral pressure ulcer, morbid obesity, HTN, dyslipidemia, hypothyroidism and chronic sinus tachycardia brought to ED by EMS due to increased swelling and pain of left arm and left shoulder, as well as left-sided chest pain.  CT angio chest negative for PE but concerning for multifocal pneumonia, nondisplaced fracture of superior left glenoid fossa and septic arthritis in left shoulder.  CT angio LUE showed nondisplaced fracture of posterosuperior glenoid, large loculated peripherally enhancing fluid collection extending from left glenohumeral joint into proximal and mid biceps musculature measuring 5.3 x 3.6 x 7.5 cm concerning for septic arthritis and intramuscular abscess, questionable developing humeral head osteomyelitis.  Orthopedic surgery consulted and recommended IR drainage and Ortho evaluation the morning. Blood cultures obtained.  Started on IV fluid and for spectrum antibiotics.  Patient had diagnostic aspiration of LUE intramuscular abscess.  Blood and abscess culture with MRSA.  ID consulted.  She also underwent left shoulder hemiarthroplasty by Dr. Cristy on 6/29.  Subjective: Seen and examined earlier this afternoon after she returned from surgery.  Reports feeling better.  Patient's daughter at bedside.  Objective: Vitals:   11/15/23 1010 11/15/23 1015 11/15/23 1030 11/15/23 1045  BP: 102/64 124/78 (!) 142/82   Pulse: (!) 104 (!) 104 (!) 109 (!) 105   Resp: 20 18 14  (!) 24  Temp:    98 F (36.7 C)  TempSrc:      SpO2: 93% 99% 100% 98%  Weight:      Height:        Examination:  GENERAL: No apparent distress.  Nontoxic. HEENT: MMM.  Vision and hearing grossly intact.  NECK: Supple.  No apparent JVD.  RESP:  No IWOB.  Fair aeration bilaterally. CVS:  RRR. Heart sounds normal.  ABD/GI/GU: BS+. Abd soft, NTND.  MSK/EXT:  Moves extremities.  LUE in sling.  Dressing over anterior left shoulder.  Drain in place. SKIN: As above. NEURO: AA.  Oriented appropriately.  No apparent focal neuro deficit. PSYCH: Calm. Normal affect.   Consultants:  Orthopedic surgery Interventional radiology Infectious disease  Procedures: 6/28-aspiration of LUE intramuscular abscess 6/29-left shoulder hemiarthroplasty by Dr. Cristy  Microbiology summarized: 6/27-blood cultures with MRSA 6/28-A-20 pathogen RVP nonreactive 6/28-LLE abscess culture with moderate Staph aureus 6/29-blood cultures pending 6/29-surgical culture pending   Assessment and plan: Sepsis: Present on arrival.  Has tachycardia and tachypnea.  Source of infection as below. Left shoulder septic arthritis Left glenohumeral joint abscess formation Left upper extremity intramuscular abscess Nondisplaced fracture of posterior superior glenoid MRSA bacteremia-blood cultures as above. -Imaging as above.  CRP 39.  ESR 106.  Hemodynamically stable. -S/p IR aspiration of LUE intramuscular abscess by IR on 6/28 -S/p left shoulder hemiarthroplasty by Dr. Cristy -On IV vancomycin .  Cefepime  and Flagyl  discontinued. -Follow repeat blood culture and surgical tissue cultures -TTE to rule out endocarditis -IR aspiration of abscess today.  -Pain control per orthopedic surgery -Contact precaution   Community-acquired pneumonia: Reported left-sided chest pain  but no shortness of breath.  Stable on home oxygen .  CT angio chest concerning for multifocal pneumonia.  Could be septic  emboli. -Antibiotics as above -Pulmonary toilet  Left-sided chest pain: Likely due to pneumonia and left shoulder infection.  Mildly elevated troponin without significant delta.  No acute ischemic finding on EKG.  Has underlying chronic chest pain as well. -Manage infection as above.   Elevated troponin/history of nonobstructive CAD: Very mild without significant delta.  Likely demand ischemia.  Normal LHC in 07/2021. - Continue Lipitor and Lopressor   Chronic HFpEF?: TTE in 07/2023 with LVEF of 55 to 60%, and normal diastolic parameters although G1DD in 05/2023.  Appears euvolemic but difficult to assess given body habitus. -Continue home Lasix  and metoprolol . -Monitor fluid status and respiratory status -Follow echocardiogram   Chronic COPD/chronic hypoxic RF on 3 L at baseline.  Stable.  Does not seem to be on breathing treatments at home. -Bronchodilators as needed -Continue low-dose prednisone  she takes for RA.    Chronic pain syndrome/neuropathy -Continue home MS Contin -, Robaxin  and gabapentin    IDDM-2 with hyperglycemia and level 1 hypoglycemia: A1c 6.0% in 09/2023. Recent Labs  Lab 11/14/23 1332 11/14/23 1612 11/14/23 2103 11/15/23 0600 11/15/23 1019  GLUCAP 96 130* 119* 81 98  -Continue SSI-sensitive  Rheumatoid arthritis: On p.o. prednisone , Arava  and Plaquenil  at home. -Continue p.o. prednisone .  No signs of adrenal insufficiency. -Hold Arava  and Plaquenil  in the setting of active infection  Normocytic anemia: Hgb dropped about 1.5 g.  Patient denies melena or hematochezia.  Not on anticoagulation. Recent Labs    07/27/23 0611 08/03/23 0546 08/12/23 1303 08/12/23 1727 08/13/23 0501 08/14/23 0419 10/15/23 1455 11/13/23 1301 11/14/23 0512 11/15/23 0241  HGB 9.4* 11.2* 14.4 12.4 11.4* 10.4* 9.6* 8.9* 8.5* 7.5*  - Monitor H&H and transfuse for Hgb <7.0.  Verbally consented.   History of PE: Not on anticoagulation due to GI bleed.  CTA chest negative for PE.    History of sigmoid colon stricture s/p colectomy and colostomy creation February 2025 -Continue colostomy care.   Hyperlipidemia -Continue Lipitor   Chronic constipation -Continue MiraLAX  and senna code   GERD -Continue Protonix    Generalized anxiety disorder -Continue Cymbalta   Hypothyroidism -Continue levothyroxine   Class I obesity Body mass index is 34.14 kg/m.         DVT prophylaxis:  heparin  injection 5,000 Units Start: 11/13/23 2200 SCDs Start: 11/13/23 2123 Place TED hose Start: 11/13/23 2123  Code Status: Full code Family Communication: Updated patient's daughter at bedside Level of care: Telemetry Cardiac Status is: Inpatient Remains inpatient appropriate because: Sepsis in the setting of left shoulder infection/abscess and MRSA bacteremia   Final disposition: To be determined   55 minutes with more than 50% spent in reviewing records, counseling patient/family and coordinating care.   Sch Meds:  Scheduled Meds:  atorvastatin   10 mg Oral Daily   cholecalciferol   1,000 Units Oral Daily   DULoxetine   60 mg Oral Daily   furosemide   40 mg Oral Daily   gabapentin   300 mg Oral BID   guaiFENesin   1,200 mg Oral BID   heparin   5,000 Units Subcutaneous Q8H   insulin  aspart  0-5 Units Subcutaneous QHS   insulin  aspart  0-9 Units Subcutaneous TID WC   levothyroxine   50 mcg Oral Q0600   lidocaine  (PF)  8 mL Intradermal Once   metoprolol  tartrate  25 mg Oral BID   morphine   15 mg Oral Q12H   pantoprazole   40  mg Oral BID   predniSONE   10 mg Oral Q breakfast   sodium chloride  flush  3 mL Intravenous Q12H   sodium chloride  flush  3 mL Intravenous Q12H   Continuous Infusions:  vancomycin      PRN Meds:.acetaminophen , bisacodyl , hyoscyamine , ipratropium-albuterol , LORazepam , methocarbamol , ondansetron  (ZOFRAN ) IV, polyethylene glycol, senna, sodium chloride  flush  Antimicrobials: Anti-infectives (From admission, onward)    Start     Dose/Rate Route  Frequency Ordered Stop   11/15/23 2130  vancomycin  (VANCOREADY) IVPB 1750 mg/350 mL       Placed in Followed by Linked Group   1,750 mg 175 mL/hr over 120 Minutes Intravenous Every 48 hours 11/13/23 2124     11/15/23 0914  vancomycin  (VANCOCIN ) powder  Status:  Discontinued          As needed 11/15/23 0915 11/15/23 0958   11/15/23 0600  ceFAZolin  (ANCEF ) IVPB 2g/100 mL premix        2 g 200 mL/hr over 30 Minutes Intravenous On call to O.R. 11/14/23 1837 11/15/23 0904   11/14/23 1115  metroNIDAZOLE  (FLAGYL ) IVPB 500 mg  Status:  Discontinued        500 mg 100 mL/hr over 60 Minutes Intravenous Every 12 hours 11/14/23 1107 11/15/23 0656   11/13/23 2130  ceFEPIme  (MAXIPIME ) 2 g in sodium chloride  0.9 % 100 mL IVPB  Status:  Discontinued        2 g 200 mL/hr over 30 Minutes Intravenous Every 12 hours 11/13/23 2124 11/15/23 0656   11/13/23 2130  vancomycin  (VANCOREADY) IVPB 2000 mg/400 mL       Placed in Followed by Linked Group   2,000 mg 200 mL/hr over 120 Minutes Intravenous  Once 11/13/23 2124 11/14/23 0236        I have personally reviewed the following labs and images: CBC: Recent Labs  Lab 11/13/23 1301 11/14/23 0512 11/15/23 0241  WBC 10.5 11.2* 7.0  HGB 8.9* 8.5* 7.5*  HCT 30.7* 29.7* 25.8*  MCV 98.7 98.0 94.9  PLT 153 164 160   BMP &GFR Recent Labs  Lab 11/13/23 1301 11/14/23 0512 11/15/23 0241  NA 139 142 138  K 4.1 3.9 3.4*  CL 100 103 103  CO2 28 29 27   GLUCOSE 152* 88 82  BUN 16 14 11   CREATININE 0.95 1.00 0.94  CALCIUM  8.0* 8.1* 7.5*  MG  --   --  1.9  PHOS  --   --  3.2   Estimated Creatinine Clearance: 51.1 mL/min (by C-G formula based on SCr of 0.94 mg/dL). Liver & Pancreas: Recent Labs  Lab 11/14/23 0512 11/15/23 0241  AST 19  --   ALT 22  --   ALKPHOS 129*  --   BILITOT 0.9  --   PROT 5.4*  --   ALBUMIN  2.0* 1.7*   No results for input(s): LIPASE, AMYLASE in the last 168 hours. No results for input(s): AMMONIA in the last  168 hours. Diabetic: No results for input(s): HGBA1C in the last 72 hours. Recent Labs  Lab 11/14/23 1332 11/14/23 1612 11/14/23 2103 11/15/23 0600 11/15/23 1019  GLUCAP 96 130* 119* 81 98   Cardiac Enzymes: No results for input(s): CKTOTAL, CKMB, CKMBINDEX, TROPONINI in the last 168 hours. No results for input(s): PROBNP in the last 8760 hours. Coagulation Profile: No results for input(s): INR, PROTIME in the last 168 hours. Thyroid  Function Tests: No results for input(s): TSH, T4TOTAL, FREET4, T3FREE, THYROIDAB in the last 72 hours. Lipid Profile: No results for  input(s): CHOL, HDL, LDLCALC, TRIG, CHOLHDL, LDLDIRECT in the last 72 hours. Anemia Panel: No results for input(s): VITAMINB12, FOLATE, FERRITIN, TIBC, IRON , RETICCTPCT in the last 72 hours. Urine analysis:    Component Value Date/Time   COLORURINE YELLOW 07/15/2023 1616   APPEARANCEUR HAZY (A) 07/15/2023 1616   LABSPEC >1.046 (H) 07/15/2023 1616   PHURINE 5.0 07/15/2023 1616   GLUCOSEU NEGATIVE 07/15/2023 1616   HGBUR NEGATIVE 07/15/2023 1616   BILIRUBINUR NEGATIVE 07/15/2023 1616   BILIRUBINUR 1+ 01/30/2021 1450   KETONESUR 5 (A) 07/15/2023 1616   PROTEINUR NEGATIVE 07/15/2023 1616   UROBILINOGEN negative (A) 01/30/2021 1450   UROBILINOGEN 0.2 11/28/2019 1424   NITRITE NEGATIVE 07/15/2023 1616   LEUKOCYTESUR NEGATIVE 07/15/2023 1616   Sepsis Labs: Invalid input(s): PROCALCITONIN, LACTICIDVEN  Microbiology: Recent Results (from the past 240 hours)  Body fluid culture w Gram Stain     Status: None (Preliminary result)   Collection Time: 11/13/23  9:22 PM   Specimen: Abscess; Body Fluid  Result Value Ref Range Status   Specimen Description ABSCESS  Final   Special Requests JOINT,LEFT SHOULDER  Final   Gram Stain   Final    MODERATE WBC PRESENT,BOTH PMN AND MONONUCLEAR RARE GRAM POSITIVE COCCI    Culture   Final    MODERATE STAPHYLOCOCCUS  AUREUS SUSCEPTIBILITIES TO FOLLOW Performed at [facility], [address] [place], [place] [address]    Report Status PENDING  Incomplete  Anaerobic culture w Gram Stain     Status: None (Preliminary result)   Collection Time: 11/13/23  9:22 PM   Specimen: Abscess  Result Value Ref Range Status   Specimen Description ABSCESS  Final   Special Requests JOINT,LEFT SHOULDER  Final   Gram Stain   Final    MODERATE WBC PRESENT,BOTH PMN AND MONONUCLEAR RARE GRAM POSITIVE COCCI Performed at [facility], [address] [place], [place] [address]    Culture PENDING  Incomplete   Report Status PENDING  Incomplete  Culture, blood (Routine X 2) w Reflex to ID Panel     Status: Abnormal (Preliminary result)   Collection Time: 11/13/23 11:31 PM   Specimen: BLOOD RIGHT HAND  Result Value Ref Range Status   Specimen Description BLOOD RIGHT HAND  Final   Special Requests   Final    BOTTLES DRAWN AEROBIC ONLY Blood Culture results may not be optimal due to an inadequate volume of blood received in culture bottles   Culture  Setup Time   Final    GRAM POSITIVE COCCI AEROBIC BOTTLE ONLY CRITICAL RESULT CALLED TO, READ BACK BY AND VERIFIED WITH: PHARMD S WISE 11/14/2023 @ 2132 BY AB    Culture (A)  Final    STAPHYLOCOCCUS AUREUS SUSCEPTIBILITIES TO FOLLOW Performed at [facility], [address], [place], [place] [address]    Report Status PENDING  Incomplete  Blood Culture ID Panel (Reflexed)     Status: Abnormal   Collection Time: 11/13/23 11:31 PM  Result Value Ref Range Status   Enterococcus faecalis NOT DETECTED NOT DETECTED Final   Enterococcus Faecium NOT DETECTED NOT DETECTED Final   Listeria monocytogenes NOT DETECTED NOT DETECTED Final   Staphylococcus species DETECTED (A) NOT DETECTED Final    Comment: CRITICAL RESULT CALLED TO, READ BACK BY AND VERIFIED WITH: PHARMD S WISE 11/14/2023 @ 2132 BY AB    Staphylococcus aureus (BCID) DETECTED (A) NOT DETECTED  Final    Comment: Methicillin (oxacillin)-resistant Staphylococcus aureus (MRSA). MRSA  is predictably resistant to beta-lactam antibiotics (except ceftaroline). Preferred therapy is vancomycin  unless clinically contraindicated. Patient requires contact precautions if  hospitalized. CRITICAL RESULT CALLED TO, READ BACK BY AND VERIFIED WITH: PHARMD S WISE 11/14/2023 @ 2132 BY AB    Staphylococcus epidermidis NOT DETECTED NOT DETECTED Final   Staphylococcus lugdunensis NOT DETECTED NOT DETECTED Final   Streptococcus species NOT DETECTED NOT DETECTED Final   Streptococcus agalactiae NOT DETECTED NOT DETECTED Final   Streptococcus pneumoniae NOT DETECTED NOT DETECTED Final   Streptococcus pyogenes NOT DETECTED NOT DETECTED Final   A.calcoaceticus-baumannii NOT DETECTED NOT DETECTED Final   Bacteroides fragilis NOT DETECTED NOT DETECTED Final   Enterobacterales NOT DETECTED NOT DETECTED Final   Enterobacter cloacae complex NOT DETECTED NOT DETECTED Final   Escherichia coli NOT DETECTED NOT DETECTED Final   Klebsiella aerogenes NOT DETECTED NOT DETECTED Final   Klebsiella oxytoca NOT DETECTED NOT DETECTED Final   Klebsiella pneumoniae NOT DETECTED NOT DETECTED Final   Proteus species NOT DETECTED NOT DETECTED Final   Salmonella species NOT DETECTED NOT DETECTED Final   Serratia marcescens NOT DETECTED NOT DETECTED Final   Haemophilus influenzae NOT DETECTED NOT DETECTED Final   Neisseria meningitidis NOT DETECTED NOT DETECTED Final   Pseudomonas aeruginosa NOT DETECTED NOT DETECTED Final   Stenotrophomonas maltophilia NOT DETECTED NOT DETECTED Final   Candida albicans NOT DETECTED NOT DETECTED Final   Candida auris NOT DETECTED NOT DETECTED Final   Candida glabrata NOT DETECTED NOT DETECTED Final   Candida krusei NOT DETECTED NOT DETECTED Final   Candida parapsilosis NOT DETECTED NOT DETECTED Final   Candida tropicalis NOT DETECTED NOT DETECTED Final   Cryptococcus neoformans/gattii NOT  DETECTED NOT DETECTED Final   Meth resistant mecA/C and MREJ DETECTED (A) NOT DETECTED Final    Comment: CRITICAL RESULT CALLED TO, READ BACK BY AND VERIFIED WITH: PHARMD S WISE 11/14/2023 @ 2132 BY AB Performed at [facility], [address], [place], [place] [address]   Culture, blood (Routine X 2) w Reflex to ID Panel     Status: None (Preliminary result)   Collection Time: 11/13/23 11:48 PM   Specimen: BLOOD RIGHT FOREARM  Result Value Ref Range Status   Specimen Description BLOOD RIGHT FOREARM  Final   Special Requests   Final    BOTTLES DRAWN AEROBIC ONLY Blood Culture results may not be optimal due to an inadequate volume of blood received in culture bottles   Culture  Setup Time   Final    GRAM POSITIVE COCCI IN CLUSTERS AEROBIC BOTTLE ONLY CRITICAL VALUE NOTED.  VALUE IS CONSISTENT WITH PREVIOUSLY REPORTED AND CALLED VALUE. Performed at [facility], [address] [place], [place] [address]    Culture GRAM POSITIVE COCCI  Final   Report Status PENDING  Incomplete  Respiratory (~20 pathogens) panel by PCR     Status: None   Collection Time: 11/14/23  5:12 AM   Specimen: Nasopharyngeal Swab; Respiratory  Result Value Ref Range Status   Adenovirus NOT DETECTED NOT DETECTED Final   Coronavirus 229E NOT DETECTED NOT DETECTED Final    Comment: (NOTE) The Coronavirus on the Respiratory Panel, DOES NOT test for the novel  Coronavirus (2019 nCoV)    Coronavirus HKU1 NOT DETECTED NOT DETECTED Final   Coronavirus NL63 NOT DETECTED NOT DETECTED Final   Coronavirus OC43 NOT DETECTED NOT DETECTED Final   Metapneumovirus NOT DETECTED NOT DETECTED Final   Rhinovirus / Enterovirus NOT DETECTED NOT DETECTED Final  Influenza A NOT DETECTED NOT DETECTED Final   Influenza B NOT DETECTED NOT DETECTED Final   Parainfluenza Virus 1 NOT DETECTED NOT DETECTED Final   Parainfluenza Virus 2 NOT DETECTED NOT DETECTED Final   Parainfluenza Virus 3 NOT DETECTED NOT DETECTED Final    Parainfluenza Virus 4 NOT DETECTED NOT DETECTED Final   Respiratory Syncytial Virus NOT DETECTED NOT DETECTED Final   Bordetella pertussis NOT DETECTED NOT DETECTED Final   Bordetella Parapertussis NOT DETECTED NOT DETECTED Final   Chlamydophila pneumoniae NOT DETECTED NOT DETECTED Final   Mycoplasma pneumoniae NOT DETECTED NOT DETECTED Final    Comment: Performed at [facility], [address] [place], [place] [address]  Aerobic/Anaerobic Culture w Gram Stain (surgical/deep wound)     Status: None (Preliminary result)   Collection Time: 11/14/23 11:05 AM   Specimen: Abscess  Result Value Ref Range Status   Specimen Description ABSCESS  Final   Special Requests ABSCESS  Final   Gram Stain   Final    ABUNDANT WBC PRESENT,BOTH PMN AND MONONUCLEAR RARE GRAM POSITIVE COCCI    Culture   Final    TOO YOUNG TO READ Performed at [facility], [address] [place], [place] [address]    Report Status PENDING  Incomplete  MRSA Next Gen by PCR, Nasal     Status: Abnormal   Collection Time: 11/15/23  6:33 AM   Specimen: Nasal Mucosa; Nasal Swab  Result Value Ref Range Status   MRSA by PCR Next Gen DETECTED (A) NOT DETECTED Final    Comment: RESULT CALLED TO, READ BACK BY AND VERIFIED WITH: MERILEE HUA, RN  [phone] [id number] BY JINNY COMMON, [place] (NOTE) The GeneXpert MRSA Assay (FDA approved for NASAL specimens only), is one component of a comprehensive MRSA colonization surveillance program. It is not intended to diagnose MRSA infection nor to guide or monitor treatment for MRSA infections. Test performance is not FDA approved in patients less than 37 years old. Performed at [facility], [address] [place], [place] [address]     Radiology Studies: ARCOLA MURTIS CHEEKS NEEDLE [facility] ASPIRATION/INJECTION LOC Result Date: 11/15/2023 CLINICAL DATA:  10220 Abscess [id number] 83 year old female with left shoulder pain. Previous imaging showed fluid collection in the left shoulder and  left upper arm musculatures. Request for left shoulder aspiration. EXAM: FLUOROSCOPIC GUIDED LEFT SHOULDER ARTHROCENTESIS FLUOROSCOPY: Radiation Exposure Index and estimated peak skin dose (PSD); Reference air kerma (RAK), 1.9 mGy. Kerma-area product (KAP), 13.69 uGy*m. PROCEDURE: After a thorough discussion of risks and benefits of the procedure, written consent was obtained. The consent discussion included the risk of bleeding, infection, injury to nerves and adjacent blood vessels, low yield and allergic reaction. Preliminary localization was performed over the LEFT shoulder. The area was marked over superior medial anterior humeral head. After prep and drape in the usual sterile fashion, the skin and deeper subcutaneous tissues were anesthetized with 1% Lidocaine  without Epinephrine . Under fluoroscopic guidance, a 20 gauge 3.5 inch spinal needle was advanced into the joint over the superior medial anterior humeral head using an anterior approach. 2.5 mL of hazy yellow fluid was aspirated without difficulty. The fluid will be sent for laboratory. The needle removed, and a sterile dressing applied. The patient tolerated the procedure well and there were no complications. IMPRESSION: Successful fluoroscopically-guided LEFT shoulder arthrocentesis. Performed by: Aimee Han, PA-C Electronically Signed   By: Thom Hall M.D.   On: 11/15/2023 10:10   US  FNA SOFT TISSUE  Result Date: 11/14/2023 INDICATION: 10220 Abscess [id number] EXAM: US -GUIDED DIAGNOSTIC ASPIRATION OF A LEFT UPPER EXTREMITY SUBCUTANEOUS FLUID COLLECTION COMPARISON:  CTA LEFT UPPER EXTREMITY, 11/13/2023. MR SHOULDER, 11/14/2023 MEDICATIONS: The patient is currently admitted to the hospital and receiving intravenous antibiotics. The antibiotics were administered within an appropriate time frame prior to the initiation of the procedure. ANESTHESIA/SEDATION: Local anesthetic was administered. CONTRAST:  None COMPLICATIONS: None immediate. PROCEDURE:  Informed written consent was obtained from the patient after a discussion of the risks, benefits and alternatives to treatment. Preprocedural ultrasound scanning demonstrated a large heterogeneously dense, anechoic subcutaneous fluid collection. A timeout was performed prior to the initiation of the procedure. The proximal LEFT upper extremity was prepped and draped in the usual sterile fashion. The overlying soft tissues were anesthetized with 1% lidocaine  with epinephrine . Under direct ultrasound guidance, a 18 gauge trocar needle was advanced into the abscess/fluid collection. Multiple ultrasound images were saved for procedural documentation purposes. Next, approximately 45 mL of purulent fluid was aspirated from the collection. A representative sample of aspirated fluid was capped and sent to the laboratory for analysis. The needle was removed and superficial hemostasis was achieved with manual compression. A dressing was placed. The patient tolerated the procedure well without immediate postprocedural complication. IMPRESSION: Successful US  guided of 45 mL of purulent fluid from the LEFT upper extremity subcutaneous fluid collection. A representative aspirated sample was sent to the laboratory as requested by the ordering clinical team. Thom Hall, MD [facility] Electronically Signed   By: Thom Hall M.D.   On: 11/14/2023 15:37   MR SHOULDER LEFT W WO CONTRAST Result Date: 11/14/2023 CLINICAL DATA:  Concern for septic arthritis of the left shoulder. EXAM: MRI OF THE LEFT SHOULDER WITHOUT AND WITH CONTRAST TECHNIQUE: Multiplanar, multisequence MR imaging of the left shoulder was performed before and after the administration of intravenous contrast. CONTRAST:  9mL GADAVIST GADOBUTROL 1 MMOL/ML IV SOLN COMPARISON:  CTA of the left upper extremity dated 11/13/2023. FINDINGS: Glenohumeral Joint: Large complex glenohumeral joint effusion measuring  approximately 8.6 cm AP by 4.5 cm TR with synovitis and thickened peripheral enhancement, concerning for septic arthritis. This complex fluid likely communicates with and decompresses into the biceps tendon sheath which demonstrates complex fluid distension and surrounding thick peripheral enhancement, concerning for infectious tenosynovitis, which extends distally beyond the field of view with surrounding intramuscular edema which could reflect myositis (series 3, image 18 and series 9, image 18). There is surrounding soft tissue edema and enhancement, concerning for cellulitis. Glenohumeral joint remains aligned. Rotator cuff: Moderate to severe fatty atrophy of the supraspinatus and infraspinatus muscles and to a lesser degree the subscapularis muscle. High-grade to near complete retracted insertional tears of the supraspinatus and subscapularis tendons. Infraspinatus tendinosis with partial-thickness articular sided insertional tear. Teres minor tendon is intact. Biceps Long Head: Intraarticular biceps tendon is not clearly identified and likely torn and retracted. Acromioclavicular Joint: Mild-to-moderate arthropathy of the acromioclavicular joint. Fluid within the subacromial/subdeltoid bursa, likely secondary to glenohumeral effusion extending through full-thickness rotator cuff tears. Labrum: Diffuse labral degeneration with tearing. Bones: Large complex glenohumeral joint effusion, as described above, with periarticular edema and enhancement of the glenoid and humeral head, concerning for septic arthritis/osteomyelitis. No definite acute fracture identified. IMPRESSION: 1. Large complex glenohumeral joint effusion with synovitis, thickened peripheral enhancement, and periarticular edema/enhancement of the glenoid and humeral head. These findings are concerning for septic arthritis/osteomyelitis. Edema and enhancement of the surrounding rotator cuff musculature is suspicious for myositis.  The complex  glenohumeral effusion likely communicates with and decompresses into the biceps tendon sheath which demonstrates complex fluid distension with surrounding thick peripheral enhancement, concerning for infectious tenosynovitis extending distally beyond the field of view with surrounding intramuscular edema of the biceps, concerning for myositis. 2. High-grade to near complete retracted insertional tears of the supraspinatus and subscapularis tendons. Infraspinatus tendinosis with partial-thickness articular sided insertional tear. Moderate to severe fatty atrophy of the supraspinatus and infraspinatus muscles and to a lesser degree the subscapularis muscle. 3. Intra-articular biceps tendon is not clearly identified and likely torn and retracted. 4. Diffuse labral degeneration with tearing. 5. Moderate glenohumeral joint arthritis. Electronically Signed   By: Harrietta Sherry M.D.   On: 11/14/2023 13:37      Lakesia Dahle T. Akiba Melfi [facility]  If 7PM-7AM, please contact night-coverage [email or web address] 11/15/2023, 11:04 AM

## 2023-11-16 ENCOUNTER — Other Ambulatory Visit (HOSPITAL_COMMUNITY)

## 2023-11-16 ENCOUNTER — Inpatient Hospital Stay (HOSPITAL_COMMUNITY)

## 2023-11-16 DIAGNOSIS — Z8679 Personal history of other diseases of the circulatory system: Secondary | ICD-10-CM | POA: Diagnosis not present

## 2023-11-16 DIAGNOSIS — B9562 Methicillin resistant Staphylococcus aureus infection as the cause of diseases classified elsewhere: Secondary | ICD-10-CM | POA: Diagnosis not present

## 2023-11-16 DIAGNOSIS — R7881 Bacteremia: Secondary | ICD-10-CM | POA: Diagnosis not present

## 2023-11-16 DIAGNOSIS — M009 Pyogenic arthritis, unspecified: Secondary | ICD-10-CM | POA: Diagnosis not present

## 2023-11-16 DIAGNOSIS — Z5181 Encounter for therapeutic drug level monitoring: Secondary | ICD-10-CM

## 2023-11-16 DIAGNOSIS — M00012 Staphylococcal arthritis, left shoulder: Secondary | ICD-10-CM | POA: Diagnosis not present

## 2023-11-16 DIAGNOSIS — R000 Tachycardia, unspecified: Secondary | ICD-10-CM | POA: Diagnosis not present

## 2023-11-16 DIAGNOSIS — R6510 Systemic inflammatory response syndrome (SIRS) of non-infectious origin without acute organ dysfunction: Secondary | ICD-10-CM | POA: Diagnosis not present

## 2023-11-16 LAB — RENAL FUNCTION PANEL
Albumin: 1.8 g/dL — ABNORMAL LOW (ref 3.5–5.0)
Anion gap: 9 (ref 5–15)
BUN: 13 mg/dL (ref 8–23)
CO2: 27 mmol/L (ref 22–32)
Calcium: 7.6 mg/dL — ABNORMAL LOW (ref 8.9–10.3)
Chloride: 103 mmol/L (ref 98–111)
Creatinine, Ser: 0.95 mg/dL (ref 0.44–1.00)
GFR, Estimated: >60 mL/min (ref >60)
Glucose, Bld: 107 mg/dL — ABNORMAL HIGH (ref 70–99)
Phosphorus: 3.5 mg/dL (ref 2.5–4.6)
Potassium: 4.5 mmol/L (ref 3.5–5.1)
Sodium: 139 mmol/L (ref 135–145)

## 2023-11-16 LAB — CULTURE, BLOOD (ROUTINE X 2): Culture  Setup Time: NO GROWTH

## 2023-11-16 LAB — HEMOGLOBIN AND HEMATOCRIT, BLOOD
HCT: 23 % — ABNORMAL LOW (ref 36.0–46.0)
Hemoglobin: 6.8 g/dL — CL (ref 12.0–15.0)

## 2023-11-16 LAB — CBC
HCT: 23.1 % — ABNORMAL LOW (ref 36.0–46.0)
HCT: 28.6 % — ABNORMAL LOW (ref 36.0–46.0)
Hemoglobin: 6.9 g/dL — CL (ref 12.0–15.0)
Hemoglobin: 8.9 g/dL — ABNORMAL LOW (ref 12.0–15.0)
MCH: 27.9 pg (ref 26.0–34.0)
MCH: 29 pg (ref 26.0–34.0)
MCHC: 29.9 g/dL — ABNORMAL LOW (ref 30.0–36.0)
MCHC: 31.1 g/dL (ref 30.0–36.0)
MCV: 93.5 fL (ref 80.0–100.0)
MCV: 93.2 fL (ref 80.0–100.0)
Platelets: 149 10*3/uL — ABNORMAL LOW (ref 150–400)
Platelets: 180 10*3/uL (ref 150–400)
RBC: 2.47 MIL/uL — ABNORMAL LOW (ref 3.87–5.11)
RBC: 3.07 MIL/uL — ABNORMAL LOW (ref 3.87–5.11)
RDW: 15.4 % (ref 11.5–15.5)
RDW: 15.4 % (ref 11.5–15.5)
WBC: 6.6 10*3/uL (ref 4.0–10.5)
WBC: 8.7 10*3/uL (ref 4.0–10.5)
nRBC: 0 % (ref 0.0–0.2)
nRBC: 0 % (ref 0.0–0.2)

## 2023-11-16 LAB — BODY FLUID CULTURE W GRAM STAIN

## 2023-11-16 LAB — ECHOCARDIOGRAM COMPLETE
AR max vel: 2.25 cm2
AV Area VTI: 2.29 cm2
AV Area mean vel: 2.19 cm2
AV Mean grad: 3 mmHg
AV Peak grad: 4 mmHg
Ao pk vel: 1 m/s
Area-P 1/2: 3.99 cm2
Height: 64.016 in
S' Lateral: 2.3 cm
Weight: 3184.01 [oz_av]

## 2023-11-16 LAB — PREPARE RBC (CROSSMATCH)

## 2023-11-16 LAB — GLUCOSE, CAPILLARY
Glucose-Capillary: 107 mg/dL — ABNORMAL HIGH (ref 70–99)
Glucose-Capillary: 112 mg/dL — ABNORMAL HIGH (ref 70–99)
Glucose-Capillary: 111 mg/dL — ABNORMAL HIGH (ref 70–99)
Glucose-Capillary: 139 mg/dL — ABNORMAL HIGH (ref 70–99)

## 2023-11-16 LAB — MAGNESIUM: Magnesium: 2 mg/dL (ref 1.7–2.4)

## 2023-11-16 MED ORDER — HYDRALAZINE HCL 25 MG PO TABS
25.0000 mg | ORAL_TABLET | Freq: Four times a day (QID) | ORAL | Status: DC | PRN
  Administered 2023-11-16 – 2023-11-19 (×2): 25 mg via ORAL
  Filled 2023-11-16 (×2): qty 1

## 2023-11-16 MED ORDER — DAPTOMYCIN-SODIUM CHLORIDE 700-0.9 MG/100ML-% IV SOLN
8.0000 mg/kg | Freq: Every day | INTRAVENOUS | Status: DC
  Administered 2023-11-16 – 2023-11-22 (×7): 700 mg via INTRAVENOUS
  Filled 2023-11-16 (×7): qty 100

## 2023-11-16 MED ORDER — METOPROLOL TARTRATE 50 MG PO TABS
50.0000 mg | ORAL_TABLET | Freq: Two times a day (BID) | ORAL | Status: DC
  Administered 2023-11-16 – 2023-11-22 (×13): 50 mg via ORAL
  Filled 2023-11-16 (×13): qty 1

## 2023-11-16 MED ORDER — OXYCODONE HCL 5 MG PO TABS
5.0000 mg | ORAL_TABLET | ORAL | Status: DC | PRN
  Administered 2023-11-16 – 2023-11-17 (×2): 10 mg via ORAL
  Administered 2023-11-17 – 2023-11-20 (×4): 5 mg via ORAL
  Administered 2023-11-21: 10 mg via ORAL
  Administered 2023-11-21: 5 mg via ORAL
  Administered 2023-11-22: 10 mg via ORAL
  Filled 2023-11-16: qty 1
  Filled 2023-11-16 (×4): qty 2
  Filled 2023-11-16 (×5): qty 1

## 2023-11-16 MED ORDER — SODIUM CHLORIDE 0.9% IV SOLUTION: Freq: Once | INTRAVENOUS | Status: AC

## 2023-11-16 NOTE — Progress Notes (Signed)
   ORTHOPAEDIC PROGRESS NOTE  s/p Procedure(s): HEMIARTHROPLASTY, SHOULDER  SUBJECTIVE: Reports mild pain about operative site. Feels better than she did yesterday. Family member at bedside.     OBJECTIVE: PE: General: sitting up in hospital bed, NAD LUE: Dressing CDI and sling well fitting. Drain in place with minimal bloody drainage noted in the canister. Swelling of the left hand as to be expected. + Motor in  AIN, PIN, Ulnar distributions. Axillary nerve sensation preserved and symmetric per patient report.  Sensation intact in medial, radial, and ulnar distributions. Well perfused digits.    Vitals:   11/16/23 1128 11/16/23 1132  BP: (!) 181/110 (!) 178/115  Pulse: 83 81  Resp: 14 19  Temp: 98.7 F (37.1 C)   SpO2: 97% 100%     ASSESSMENT: [name] is a 83 y.o. female doing well postoperatively.  PLAN: Weightbearing: NWB LUE Insicional and dressing care: Reinforce dressings as needed Orthopedic device(s): Splint and hemovac Showering: Hold for now VTE prophylaxis: Lovenox  while inpatient Pain control: PRN pain medications, minimize narcotics as able Follow - up plan: 2 weeks in office Dispo: TBD. Pending medical stabilization.  Contact information:  Weekdays 8-5 Dr. Bonner Hair, Aleck Stalling PA-C, After hours and holidays please check [email or web address] for group call information for [facility]  Aleck Stalling, PA-C 11/16/23

## 2023-11-16 NOTE — Evaluation (Signed)
 Occupational Therapy Evaluation Patient Details Name: [name] MRN: [id number] DOB: Feb 13, 1941 Today's Date: 11/16/2023   History of Present Illness   83 y.o. female admitted 6/27 from home reporting increased swelling  and pain in L arm. Negative for DVT. CT showed nondisplaced fracture of the posterior superior glenoid, a large loculated fluid collection from the glenohumeral joint into the proximal and mid biceps. Hemiarthroplasty  6/29. PMH: DM, HTN, CAD, RA,  s/p  colectomy with colostomy creation, CHF, COPD     Clinical Impressions Patient is s/p L hemiarthopasty surgery resulting in functional limitations due to the deficits listed below (see OT problem list). Pt at this time requires (A) for all bed mobility transfers and adls. Pt reports baseline using w/c to get from room to room. Pt limited at this time due to L UE NWB. Pt has heel wound on L LE that will possibly limit ability to use a w/c with feet propelling it. Pt agreeable to community rehab to help decrease the burden of care on her family.  Patient will benefit from skilled OT acutely to increase independence and safety with ADLS to allow discharge skilled inpatient follow up therapy, <3 hours/day. .      If plan is discharge home, recommend the following:         Functional Status Assessment   Patient has had a recent decline in their functional status and demonstrates the ability to make significant improvements in function in a reasonable and predictable amount of time.     Equipment Recommendations   Hospital bed;Wheelchair cushion (measurements OT);Wheelchair (measurements OT);BSC/3in1     Recommendations for Other Services   PT consult     Precautions/Restrictions   Precautions Precautions: Fall;Shoulder Type of Shoulder Precautions: hemiarthoplasty Shoulder Interventions: At all times Recall of Precautions/Restrictions: Impaired Precaution/Restrictions Comments: hemovac drain, allowed  hand wrist and elbow movement Required Braces or Orthoses: Sling Restrictions Weight Bearing Restrictions Per Provider Order: Yes LUE Weight Bearing Per Provider Order: Non weight bearing     Mobility Bed Mobility Overal bed mobility: Needs Assistance Bed Mobility: Rolling, Supine to Sit, Sit to Supine Rolling: Mod assist   Supine to sit: +2 for physical assistance, Mod assist Sit to supine: Mod assist, +2 for physical assistance, +2 for safety/equipment   General bed mobility comments: pt educated on nwb L UE so exiting on the R side of the bed. pt initiates the task and (A) for trunk to static sit. pt CGA static sitting. pt needs tactile input and BLE (A) for return to supine due to shoulder safety    Transfers Overall transfer level: Needs assistance Equipment used: 2 person hand held assist Transfers: Sit to/from Stand Sit to Stand: +2 physical assistance, Min assist, From elevated surface           General transfer comment: stepping toward HOB mod (A) +2      Balance Overall balance assessment: Needs assistance Sitting-balance support: No upper extremity supported, Feet supported Sitting balance-Leahy Scale: Fair     Standing balance support: No upper extremity supported, During functional activity Standing balance-Leahy Scale: Poor                             ADL either performed or assessed with clinical judgement   ADL Overall ADL's : Needs assistance/impaired Eating/Feeding: Set up   Grooming: Minimal assistance   Upper Body Bathing: Moderate assistance;Bed level   Lower Body Bathing: Total assistance  General ADL Comments: pt noted to have wound on L heel with drainage. Dressing applied to heel and recommendation for pravalon boots     Vision Baseline Vision/History: 1 Wears glasses Vision Assessment?: No apparent visual deficits     Perception         Praxis         Pertinent Vitals/Pain Pain  Assessment Pain Assessment: No/denies pain     Extremity/Trunk Assessment Upper Extremity Assessment Upper Extremity Assessment: Right hand dominant;LUE deficits/detail LUE Deficits / Details: sling with drain in place, allowed hand wrist and elbow movement. pt with arm more laterally on body in sling due to body habitus and ostomy. pt needs cues to help with adducted to body and elbow 90 degrees   Lower Extremity Assessment Lower Extremity Assessment: Defer to PT evaluation   Cervical / Trunk Assessment Cervical / Trunk Assessment: Kyphotic   Communication Communication Communication: No apparent difficulties   Cognition Arousal: Alert Behavior During Therapy: WFL for tasks assessed/performed Cognition: Difficult to assess             OT - Cognition Comments: pt tangential at times about long standing medical hx but does verbalize that she was walking 10 ft prior to admission. pt verbalized skilled nursing placement and recovery. pt reports increased time to communicate and prior SLP treatment for cognition.                 Following commands: Intact       Cueing  General Comments   Cueing Techniques: Verbal cues;Gestural cues;Visual cues  VSS on 3L [place], pt has IV over R forearm access and did not take BP due to poor access point   Exercises Exercises: Other exercises Other Exercises Other Exercises: education on sling aligment and properly fit   Shoulder Instructions      Home Living Family/patient expects to be discharged to:: Private residence                             Home Equipment: Agricultural consultant (2 wheels);Hospital bed (air mattress)   Additional Comments: the patient report that she was at skilled but the chart reports from home. Need clarification from family on prior level of function. daughter in room did not correct statement regarding skilled and confirmed the patient has an air mattress      Prior Functioning/Environment Prior  Level of Function : Needs assist  Cognitive Assist : ADLs (cognitive);Mobility (cognitive)           Mobility Comments: pt reports walking the bathroom and also reports stand pivot to the w/c then once in the bathroom standing ADLs Comments: pt reports completing    OT Problem List: Decreased strength;Decreased activity tolerance;Impaired balance (sitting and/or standing);Decreased cognition;Decreased safety awareness;Decreased knowledge of use of DME or AE;Decreased knowledge of precautions;Cardiopulmonary status limiting activity;Obesity;Impaired UE functional use   OT Treatment/Interventions: Self-care/ADL training;Therapeutic exercise;Neuromuscular education;Energy conservation;DME and/or AE instruction;Manual therapy;Therapeutic activities;Balance training;Patient/family education      OT Goals(Current goals can be found in the care plan section)   Acute Rehab OT Goals Patient Stated Goal: to get better OT Goal Formulation: Patient unable to participate in goal setting Time For Goal Achievement: 11/30/23 Potential to Achieve Goals: Good   OT Frequency:  Min 2X/week    Co-evaluation PT/OT/SLP Co-Evaluation/Treatment: Yes Reason for Co-Treatment: Complexity of the patient's impairments (multi-system involvement);Necessary to address cognition/behavior during functional activity;For patient/therapist safety   OT goals addressed during  session: ADL's and self-care;Strengthening/ROM      AM-PAC OT 6 Clicks Daily Activity     Outcome Measure Help from another person eating meals?: A Little Help from another person taking care of personal grooming?: A Little Help from another person toileting, which includes using toliet, bedpan, or urinal?: A Lot Help from another person bathing (including washing, rinsing, drying)?: A Lot Help from another person to put on and taking off regular upper body clothing?: A Lot Help from another person to put on and taking off regular lower  body clothing?: Total 6 Click Score: 13   End of Session Nurse Communication: Mobility status;Precautions;Weight bearing status  Activity Tolerance: Patient tolerated treatment well Patient left: in bed;with call bell/phone within reach;with family/visitor present;with bed alarm set (bed placed in chair poisiton for pressure relief)  OT Visit Diagnosis: Unsteadiness on feet (R26.81);Muscle weakness (generalized) (M62.81)                Time: 8752-8678 OT Time Calculation (min): 34 min Charges:  OT General Charges $OT Visit: 1 Visit OT Evaluation $OT Eval Moderate Complexity: 1 Mod   Brynn, OTR/L  Acute Rehabilitation Services Office: [phone] .   Ely Molt 11/16/2023, 3:23 PM

## 2023-11-16 NOTE — Progress Notes (Signed)
 [facility]  Date of Admission:  11/13/2023     Reason for Follow Up: Septic arthritis (HCC)  Total days of antibiotics 4         ASSESSMENT:  [name] is postop day #1 from left shoulder hemiarthroplasty in the setting of septic arthritis of the left shoulder complicated by MRSA bacteremia.  Shoulder abscess culture growing Staphylococcus aureus with sensitivities pending.  Surgical specimens with no organisms seen on Gram stain and cultures incubated for better growth.  Blood cultures obtained on 11/15/2023 without growth to date.  TTE ordered and pending.  May need TEE.  Discussed plan of care to continue current dose of daptomycin with anticipated prolonged course of antibiotics with duration to be determined by evaluation for endocarditis.  Continue to monitor blood cultures for clearance of bacteremia and hold central line placement pending blood culture clearance.  Therapeutic drug monitoring of renal function and vancomycin  levels.  Contact precautions for MRSA.  Has concerns about being able to administer medication herself secondary to hand dexterity.  Disposition to be determined.  Postoperative wound care per orthopedics remaining medical and supportive care per internal medicine.    PLAN:  Continue current dose of daptomycin. Sensitivities for daptomycin pending as send out to [facility]. Monitor blood cultures for clearance of bacteremia and hold central line placement pending clearance. Await TTE and may need TEE to check for endocarditis. Therapeutic drug monitoring of CK levels while on daptomycin. Postoperative wound care per orthopedic surgery. Contact precautions for MRSA. Remaining medical and supportive care per internal medicine.  Principal Problem:   Septic arthritis (HCC) Active Problems:   Rheumatoid arthritis (HCC)   Debility   Chronic hypoxic respiratory failure (HCC)   Depression   Insulin  dependent type 2 diabetes mellitus  (HCC)   Chronic chest pain   Chronic pain syndrome   Diabetic neuropathy (HCC)   Sinus tachycardia   History of pulmonary embolus (PE)   Hypothyroidism   Hyperlipidemia   Chronic diastolic CHF (congestive heart failure) (HCC)   GAD (generalized anxiety disorder)   SIRS (systemic inflammatory response syndrome) (HCC)   CAP (community acquired pneumonia)   Elevated troponin   History of GI bleed   History of CAD (coronary artery disease)   Stage IV pressure ulcer of sacral region (HCC)   GERD (gastroesophageal reflux disease)   Chronic constipation   MRSA bacteremia    atorvastatin   10 mg Oral Daily   cholecalciferol   1,000 Units Oral Daily   DULoxetine   60 mg Oral Daily   furosemide   40 mg Oral Daily   gabapentin   300 mg Oral BID   guaiFENesin   1,200 mg Oral BID   heparin   5,000 Units Subcutaneous Q8H   insulin  aspart  0-5 Units Subcutaneous QHS   insulin  aspart  0-9 Units Subcutaneous TID WC   levothyroxine   50 mcg Oral Q0600   lidocaine  (PF)  8 mL Intradermal Once   metoprolol  tartrate  50 mg Oral BID   morphine   15 mg Oral Q12H   pantoprazole   40 mg Oral BID   predniSONE   10 mg Oral Q breakfast   sodium chloride  flush  3 mL Intravenous Q12H   sodium chloride  flush  3 mL Intravenous Q12H    SUBJECTIVE:  Afebrile overnight with no acute events.  Tolerating antibiotics with no adverse side effects.  Allergies  Allergen Reactions   Codeine Swelling and Other (See Comments)    Facial and leg swelling Chest  pain    Remicade [Infliximab] Anaphylaxis    sent me into shock    Zestril [Lisinopril] Swelling and Rash    Face and neck swelling    Ofev  [Nintedanib] Diarrhea     Review of Systems: Review of Systems  Constitutional:  Negative for chills, fever and weight loss.  Respiratory:  Negative for cough, shortness of breath and wheezing.   Cardiovascular:  Negative for chest pain and leg swelling.  Gastrointestinal:  Negative for abdominal pain,  constipation, diarrhea, nausea and vomiting.  Skin:  Negative for rash.      OBJECTIVE: Vitals:   11/16/23 0419 11/16/23 0752 11/16/23 1128 11/16/23 1132  BP: (!) 159/94 (!) 169/105 (!) 181/110 (!) 178/115  Pulse: 93 99 83 81  Resp: 19 19 14 19   Temp:  98.4 F (36.9 C) 98.7 F (37.1 C)   TempSrc:  Oral Oral   SpO2: 98% 98% 97% 100%  Weight:      Height:       Body mass index is 34.14 kg/m.  Physical Exam Constitutional:      General: She is not in acute distress.    Appearance: She is well-developed.   Cardiovascular:     Rate and Rhythm: Normal rate and regular rhythm.     Heart sounds: Normal heart sounds.  Pulmonary:     Effort: Pulmonary effort is normal.     Breath sounds: Normal breath sounds.   Musculoskeletal:     Comments: Surgical dressing in place and clean,dry and intact.   Skin:    General: Skin is warm and dry.   Neurological:     Mental Status: She is alert and oriented to person, place, and time.   Psychiatric:        Behavior: Behavior normal.        Thought Content: Thought content normal.        Judgment: Judgment normal.     Lab Results Lab Results  Component Value Date   WBC 6.6 11/16/2023   HGB 6.9 (LL) 11/16/2023   HCT 23.1 (L) 11/16/2023   MCV 93.5 11/16/2023   PLT 149 (L) 11/16/2023    Lab Results  Component Value Date   CREATININE 0.95 11/16/2023   BUN 13 11/16/2023   NA 139 11/16/2023   K 4.5 11/16/2023   CL 103 11/16/2023   CO2 27 11/16/2023    Lab Results  Component Value Date   ALT 22 11/14/2023   AST 19 11/14/2023   ALKPHOS 129 (H) 11/14/2023   BILITOT 0.9 11/14/2023     Microbiology: Recent Results (from the past 240 hours)  Body fluid culture w Gram Stain     Status: None   Collection Time: 11/13/23  9:22 PM   Specimen: Abscess; Body Fluid  Result Value Ref Range Status   Specimen Description ABSCESS  Final   Special Requests JOINT,LEFT SHOULDER  Final   Gram Stain   Final    MODERATE WBC  PRESENT,BOTH PMN AND MONONUCLEAR RARE GRAM POSITIVE COCCI Performed at [facility], [address], [place], [place] [address]    Culture   Final    MODERATE METHICILLIN RESISTANT STAPHYLOCOCCUS AUREUS   Report Status 11/16/2023 FINAL  Final   Organism ID, Bacteria METHICILLIN RESISTANT STAPHYLOCOCCUS AUREUS  Final      Susceptibility   Methicillin resistant staphylococcus aureus - MIC*    CIPROFLOXACIN >=8 RESISTANT Resistant     ERYTHROMYCIN >=8 RESISTANT Resistant     GENTAMICIN <=0.5  SENSITIVE Sensitive     OXACILLIN >=4 RESISTANT Resistant     TETRACYCLINE >=16 RESISTANT Resistant     VANCOMYCIN  1 SENSITIVE Sensitive     TRIMETH /SULFA  <=10 SENSITIVE Sensitive     CLINDAMYCIN >=8 RESISTANT Resistant     RIFAMPIN <=0.5 SENSITIVE Sensitive     Inducible Clindamycin NEGATIVE Sensitive     LINEZOLID 2 SENSITIVE Sensitive     * MODERATE METHICILLIN RESISTANT STAPHYLOCOCCUS AUREUS  Anaerobic culture w Gram Stain     Status: None (Preliminary result)   Collection Time: 11/13/23  9:22 PM   Specimen: Abscess  Result Value Ref Range Status   Specimen Description ABSCESS  Final   Special Requests JOINT,LEFT SHOULDER  Final   Gram Stain   Final    MODERATE WBC PRESENT,BOTH PMN AND MONONUCLEAR RARE GRAM POSITIVE COCCI Performed at [facility], [address], [place], [place] [address]    Culture PENDING  Incomplete   Report Status PENDING  Incomplete  Culture, blood (Routine X 2) w Reflex to ID Panel     Status: Abnormal (Preliminary result)   Collection Time: 11/13/23 11:31 PM   Specimen: BLOOD RIGHT HAND  Result Value Ref Range Status   Specimen Description BLOOD RIGHT HAND  Final   Special Requests   Final    BOTTLES DRAWN AEROBIC ONLY Blood Culture results may not be optimal due to an inadequate volume of blood received in culture bottles   Culture  Setup Time   Final    GRAM POSITIVE COCCI AEROBIC BOTTLE ONLY CRITICAL RESULT CALLED TO, READ BACK BY AND VERIFIED  WITH: PHARMD S WISE 11/14/2023 @ 2132 BY AB    Culture (A)  Final    METHICILLIN RESISTANT STAPHYLOCOCCUS AUREUS CULTURE REINCUBATED FOR BETTER GROWTH Performed at [facility], [address], [place], [place] [address]    Report Status PENDING  Incomplete   Organism ID, Bacteria METHICILLIN RESISTANT STAPHYLOCOCCUS AUREUS  Final      Susceptibility   Methicillin resistant staphylococcus aureus - MIC*    CIPROFLOXACIN >=8 RESISTANT Resistant     ERYTHROMYCIN >=8 RESISTANT Resistant     GENTAMICIN <=0.5 SENSITIVE Sensitive     OXACILLIN >=4 RESISTANT Resistant     TETRACYCLINE >=16 RESISTANT Resistant     VANCOMYCIN  1 SENSITIVE Sensitive     TRIMETH /SULFA  <=10 SENSITIVE Sensitive     CLINDAMYCIN >=8 RESISTANT Resistant     RIFAMPIN <=0.5 SENSITIVE Sensitive     Inducible Clindamycin NEGATIVE Sensitive     LINEZOLID 2 SENSITIVE Sensitive     * METHICILLIN RESISTANT STAPHYLOCOCCUS AUREUS  Blood Culture ID Panel (Reflexed)     Status: Abnormal   Collection Time: 11/13/23 11:31 PM  Result Value Ref Range Status   Enterococcus faecalis NOT DETECTED NOT DETECTED Final   Enterococcus Faecium NOT DETECTED NOT DETECTED Final   Listeria monocytogenes NOT DETECTED NOT DETECTED Final   Staphylococcus species DETECTED (A) NOT DETECTED Final    Comment: CRITICAL RESULT CALLED TO, READ BACK BY AND VERIFIED WITH: PHARMD S WISE 11/14/2023 @ 2132 BY AB    Staphylococcus aureus (BCID) DETECTED (A) NOT DETECTED Final    Comment: Methicillin (oxacillin)-resistant Staphylococcus aureus (MRSA). MRSA is predictably resistant to beta-lactam antibiotics (except ceftaroline). Preferred therapy is vancomycin  unless clinically contraindicated. Patient requires contact precautions if  hospitalized. CRITICAL RESULT CALLED TO, READ BACK BY AND VERIFIED WITH: PHARMD S WISE 11/14/2023 @ 2132 BY AB    Staphylococcus epidermidis NOT DETECTED NOT DETECTED  Final   Staphylococcus lugdunensis NOT DETECTED NOT  DETECTED Final   Streptococcus species NOT DETECTED NOT DETECTED Final   Streptococcus agalactiae NOT DETECTED NOT DETECTED Final   Streptococcus pneumoniae NOT DETECTED NOT DETECTED Final   Streptococcus pyogenes NOT DETECTED NOT DETECTED Final   A.calcoaceticus-baumannii NOT DETECTED NOT DETECTED Final   Bacteroides fragilis NOT DETECTED NOT DETECTED Final   Enterobacterales NOT DETECTED NOT DETECTED Final   Enterobacter cloacae complex NOT DETECTED NOT DETECTED Final   Escherichia coli NOT DETECTED NOT DETECTED Final   Klebsiella aerogenes NOT DETECTED NOT DETECTED Final   Klebsiella oxytoca NOT DETECTED NOT DETECTED Final   Klebsiella pneumoniae NOT DETECTED NOT DETECTED Final   Proteus species NOT DETECTED NOT DETECTED Final   Salmonella species NOT DETECTED NOT DETECTED Final   Serratia marcescens NOT DETECTED NOT DETECTED Final   Haemophilus influenzae NOT DETECTED NOT DETECTED Final   Neisseria meningitidis NOT DETECTED NOT DETECTED Final   Pseudomonas aeruginosa NOT DETECTED NOT DETECTED Final   Stenotrophomonas maltophilia NOT DETECTED NOT DETECTED Final   Candida albicans NOT DETECTED NOT DETECTED Final   Candida auris NOT DETECTED NOT DETECTED Final   Candida glabrata NOT DETECTED NOT DETECTED Final   Candida krusei NOT DETECTED NOT DETECTED Final   Candida parapsilosis NOT DETECTED NOT DETECTED Final   Candida tropicalis NOT DETECTED NOT DETECTED Final   Cryptococcus neoformans/gattii NOT DETECTED NOT DETECTED Final   Meth resistant mecA/C and MREJ DETECTED (A) NOT DETECTED Final    Comment: CRITICAL RESULT CALLED TO, READ BACK BY AND VERIFIED WITH: PHARMD S WISE 11/14/2023 @ 2132 BY AB Performed at [facility], [address], [place], [place] [address]   Culture, blood (Routine X 2) w Reflex to ID Panel     Status: Abnormal   Collection Time: 11/13/23 11:48 PM   Specimen: BLOOD RIGHT FOREARM  Result Value Ref Range Status   Specimen Description BLOOD RIGHT  FOREARM  Final   Special Requests   Final    BOTTLES DRAWN AEROBIC ONLY Blood Culture results may not be optimal due to an inadequate volume of blood received in culture bottles   Culture  Setup Time   Final    GRAM POSITIVE COCCI IN CLUSTERS AEROBIC BOTTLE ONLY CRITICAL VALUE NOTED.  VALUE IS CONSISTENT WITH PREVIOUSLY REPORTED AND CALLED VALUE.    Culture (A)  Final    STAPHYLOCOCCUS AUREUS SUSCEPTIBILITIES PERFORMED ON PREVIOUS CULTURE WITHIN THE LAST 5 DAYS. Performed at [facility], [address] [place], [place] [address]    Report Status 11/16/2023 FINAL  Final  Respiratory (~20 pathogens) panel by PCR     Status: None   Collection Time: 11/14/23  5:12 AM   Specimen: Nasopharyngeal Swab; Respiratory  Result Value Ref Range Status   Adenovirus NOT DETECTED NOT DETECTED Final   Coronavirus 229E NOT DETECTED NOT DETECTED Final    Comment: (NOTE) The Coronavirus on the Respiratory Panel, DOES NOT test for the novel  Coronavirus (2019 nCoV)    Coronavirus HKU1 NOT DETECTED NOT DETECTED Final   Coronavirus NL63 NOT DETECTED NOT DETECTED Final   Coronavirus OC43 NOT DETECTED NOT DETECTED Final   Metapneumovirus NOT DETECTED NOT DETECTED Final   Rhinovirus / Enterovirus NOT DETECTED NOT DETECTED Final   Influenza A NOT DETECTED NOT DETECTED Final   Influenza B NOT DETECTED NOT DETECTED Final   Parainfluenza Virus 1 NOT DETECTED NOT DETECTED Final   Parainfluenza Virus 2 NOT DETECTED NOT  DETECTED Final   Parainfluenza Virus 3 NOT DETECTED NOT DETECTED Final   Parainfluenza Virus 4 NOT DETECTED NOT DETECTED Final   Respiratory Syncytial Virus NOT DETECTED NOT DETECTED Final   Bordetella pertussis NOT DETECTED NOT DETECTED Final   Bordetella Parapertussis NOT DETECTED NOT DETECTED Final   Chlamydophila pneumoniae NOT DETECTED NOT DETECTED Final   Mycoplasma pneumoniae NOT DETECTED NOT DETECTED Final    Comment: Performed at [facility], [address]  [place], [place] [address]  Aerobic/Anaerobic Culture w Gram Stain (surgical/deep wound)     Status: None (Preliminary result)   Collection Time: 11/14/23 11:05 AM   Specimen: Abscess  Result Value Ref Range Status   Specimen Description ABSCESS  Final   Special Requests ABSCESS  Final   Gram Stain   Final    ABUNDANT WBC PRESENT,BOTH PMN AND MONONUCLEAR RARE GRAM POSITIVE COCCI    Culture   Final    MODERATE STAPHYLOCOCCUS AUREUS SUSCEPTIBILITIES TO FOLLOW Performed at [facility], [address] [place], [place] [address]    Report Status PENDING  Incomplete  MRSA Next Gen by PCR, Nasal     Status: Abnormal   Collection Time: 11/15/23  6:33 AM   Specimen: Nasal Mucosa; Nasal Swab  Result Value Ref Range Status   MRSA by PCR Next Gen DETECTED (A) NOT DETECTED Final    Comment: RESULT CALLED TO, READ BACK BY AND VERIFIED WITH: MERILEE HUA, RN  [phone] [id number] BY JINNY COMMON, [place] (NOTE) The GeneXpert MRSA Assay (FDA approved for NASAL specimens only), is one component of a comprehensive MRSA colonization surveillance program. It is not intended to diagnose MRSA infection nor to guide or monitor treatment for MRSA infections. Test performance is not FDA approved in patients less than 69 years old. Performed at [facility], [address], [place], [place] [address]   Aerobic/Anaerobic Culture w Gram Stain (surgical/deep wound)     Status: None (Preliminary result)   Collection Time: 11/15/23  9:27 AM   Specimen: Path Tissue  Result Value Ref Range Status   Specimen Description TISSUE  Final   Special Requests NONE  Final   Gram Stain NO WBC SEEN NO ORGANISMS SEEN   Final   Culture   Final    CULTURE REINCUBATED FOR BETTER GROWTH Performed at [facility], [address], [place], [place] [address]    Report Status PENDING  Incomplete  Aerobic/Anaerobic Culture w Gram Stain (surgical/deep wound)     Status: None (Preliminary result)   Collection Time: 11/15/23   9:28 AM   Specimen: Path Tissue  Result Value Ref Range Status   Specimen Description TISSUE  Final   Special Requests NONE  Final   Gram Stain NO WBC SEEN NO ORGANISMS SEEN   Final   Culture   Final    CULTURE REINCUBATED FOR BETTER GROWTH Performed at [facility], [address], [place], [place] [address]    Report Status PENDING  Incomplete  Culture, blood (Routine X 2) w Reflex to ID Panel     Status: None (Preliminary result)   Collection Time: 11/15/23  1:58 PM   Specimen: BLOOD RIGHT HAND  Result Value Ref Range Status   Specimen Description BLOOD RIGHT HAND  Final   Special Requests   Final    BOTTLES DRAWN AEROBIC AND ANAEROBIC Blood Culture adequate volume   Culture   Final    NO GROWTH < 24 HOURS  Performed at [facility], [address], [place], [place] [address]    Report Status PENDING  Incomplete  Culture, blood (Routine X 2) w Reflex to ID Panel     Status: None (Preliminary result)   Collection Time: 11/15/23  2:54 PM   Specimen: BLOOD RIGHT ARM  Result Value Ref Range Status   Specimen Description BLOOD RIGHT ARM  Final   Special Requests   Final    BOTTLES DRAWN AEROBIC AND ANAEROBIC Blood Culture results may not be optimal due to an inadequate volume of blood received in culture bottles   Culture   Final    NO GROWTH < 24 HOURS Performed at [facility], [address] [place], [place] [address]    Report Status PENDING  Incomplete    I have personally spent 30 minutes involved in face-to-face and non-face-to-face activities for this patient on the day of the visit. Professional time spent includes the following activities: preparing to see the patient (review of tests), performing a medically appropriate examination, ordering medications, communicating with other health care professionals, documenting clinical information in the EMR, communicating results and counseling patient and family regarding medication and plan of care, and care  coordination.   Greg Yianni Skilling, NP [facility]  11/16/2023  1:30 PM

## 2023-11-16 NOTE — Progress Notes (Signed)
 RUE assessed with US  for PIV placement.  Multiple noncompressible veins found in RFA and RUA cephalic is noncompressible.  Vessels suitable for PIV insertion are limited.  Spoke with primary RN about potential need for PICC line.  LUE is restricted.

## 2023-11-16 NOTE — TOC Initial Note (Signed)
 Transition of Care [facility]) - Initial/Assessment Note    Patient Details  Name: [name] MRN: [id number] Date of Birth: Sep 26, 1940  Transition of Care [facility]) CM/SW Contact:    Roxie KANDICE Stain, RN Phone Number: 11/16/2023, 2:42 PM  Clinical Narrative:                  Spoke to patient regarding transition needs.  Patient lives with son who works and has walker, home [employer], wheelchair, [facility], hospital bed. Patient is active with [facility] for home health.Notified Lynette of admission. Patient went to [facility] in March 2025 Awaiting for PT, OT recommendations.   Address, Phone number and PCP verified. TOC will continue to follow for needs.  Expected Discharge Plan: Skilled Nursing Facility Barriers to Discharge: Continued Medical Work up   Patient Goals and CMS Choice Patient states their goals for this hospitalization and ongoing recovery are:: unsure if she wants rehab or home health          Expected Discharge Plan and Services   Discharge Planning Services: CM Consult   Living arrangements for the past 2 months: Single Family Home                                      Prior Living Arrangements/Services Living arrangements for the past 2 months: Single Family Home Lives with:: Adult Children (son) Patient language and need for interpreter reviewed:: Yes        Need for Family Participation in Patient Care: Yes (Comment) Care giver support system in place?: Yes (comment) Current home services: DME (walker, home [employer], wheelchair, [facility], hospital bed) Criminal Activity/Legal Involvement Pertinent to Current Situation/Hospitalization: No - Comment as needed  Activities of Daily Living   ADL Screening (condition at time of admission) Independently performs ADLs?: No Does the patient have a NEW difficulty with bathing/dressing/toileting/self-feeding that is expected to last >3 days?: No Does the patient have a NEW difficulty with getting in/out of  bed, walking, or climbing stairs that is expected to last >3 days?: No Does the patient have a NEW difficulty with communication that is expected to last >3 days?: No Is the patient deaf or have difficulty hearing?: No Does the patient have difficulty seeing, even when wearing glasses/contacts?: No Does the patient have difficulty concentrating, remembering, or making decisions?: No  Permission Sought/Granted                  Emotional Assessment Appearance:: Appears stated age Attitude/Demeanor/Rapport: Engaged Affect (typically observed): Accepting Orientation: : Oriented to Self, Oriented to Place, Oriented to Situation Alcohol  / Substance Use: Not Applicable Psych Involvement: No (comment)  Admission diagnosis:  Septic arthritis (HCC) [M00.9] Tachycardia [R00.0] Left arm pain [M79.602] Patient Active Problem List   Diagnosis Date Noted   MRSA bacteremia 11/15/2023   Septic arthritis (HCC) 11/13/2023   SIRS (systemic inflammatory response syndrome) (HCC) 11/13/2023   CAP (community acquired pneumonia) 11/13/2023   Elevated troponin 11/13/2023   History of GI bleed 11/13/2023   History of CAD (coronary artery disease) 11/13/2023   Stage IV pressure ulcer of sacral region (HCC) 11/13/2023   GERD (gastroesophageal reflux disease) 11/13/2023   Chronic constipation 11/13/2023   Normocytic anemia 11/06/2023   LLQ abdominal pain 11/06/2023   Pain due to onychomycosis of toenails of both feet 09/22/2023   Sepsis (HCC) 08/12/2023   GAD (generalized anxiety disorder) 07/24/2023   Septic shock (HCC)  07/20/2023   Type 2 diabetes mellitus with diabetic polyneuropathy, without long-term current use of insulin  (HCC) 07/20/2023   Iron  deficiency anemia 07/20/2023   Hyperlipidemia 07/20/2023   Chronic diastolic CHF (congestive heart failure) (HCC) 07/20/2023   Abdominal pain 07/16/2023   GI bleed 07/15/2023   Enterocolitis 06/30/2023   Sepsis due to undetermined organism (HCC)  06/30/2023   Hypothyroidism 06/30/2023   GERD without esophagitis 06/30/2023   Peripheral neuropathy 06/30/2023   RSV (respiratory syncytial virus pneumonia) 06/26/2023   Ileus (HCC) 06/23/2023   Lower GI bleed 12/16/2022   BRBPR (bright red blood per rectum) 12/15/2022   [name] 09/08/2022   Abnormal CT scan, sigmoid colon 04/14/2022   Colon stricture (HCC) 04/14/2022   Diverticulosis of colon without hemorrhage 04/14/2022   History of pulmonary embolus (PE) 11/15/2021   Sinus tachycardia 10/22/2021   Pulmonary HTN (HCC)    Unspecified protein-calorie malnutrition (HCC) 04/05/2021   Pressure injury of skin 03/31/2021   Thrombocytopenia (HCC) 03/30/2021   Diabetic neuropathy (HCC) 05/24/2019   Hypokalemia 04/21/2019   Anticoagulated 06/25/2018   Chronic pain syndrome 06/25/2018   CRI (chronic renal insufficiency), stage 3 (moderate) (HCC) 06/25/2018   Drug-induced constipation 06/18/2018   DVT (deep venous thrombosis) (HCC) 06/03/2018   Chronic chest pain 06/02/2018   Primary osteoarthritis of both knees 05/26/2018   Body mass index (BMI) of 50-59.9 in adult (HCC) 05/26/2018   Acquired hypothyroidism 05/26/2018   Chronic bilateral low back pain without sciatica 02/17/2018   Astigmatism with presbyopia, bilateral 03/26/2017   Cortical age-related cataract of both eyes 03/26/2017   Family history of glaucoma 03/26/2017   Long term current use of oral hypoglycemic drug 03/26/2017   Nuclear sclerotic cataract of both eyes 03/26/2017   Pain in left hip 02/18/2017   Insulin  dependent type 2 diabetes mellitus (HCC) 01/16/2017   Gout 11/29/2016   Depression 11/29/2016   Physical deconditioning 07/31/2015   Pulmonary fibrosis, postinflammatory (HCC) 07/31/2015   Bronchiectasis without complication (HCC) 07/31/2015   Tendinitis of left rotator cuff 06/13/2015   Chronic heart failure with preserved ejection fraction (HFpEF) (HCC) 04/21/2015    AKI (acute kidney injury) (HCC)    Chronic hypoxic respiratory failure (HCC) 02/09/2015   Dyspnea and respiratory abnormality 01/03/2015   Normal coronary arteries 11/02/2014   Morbid obesity (HCC) 10/30/2014   Dysphagia 08/21/2014   Debility 06/21/2014   DOE (dyspnea on exertion) 05/24/2014   Primary osteoarthritis of left knee 04/26/2014   High risk medication use 04/17/2014   Aphasia 02/12/2014   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) 01/18/2014   Lumbar facet arthropathy 01/18/2014   Bilateral edema of lower extremity 05/30/2013   Chronic cough 05/24/2013   ILD (interstitial lung disease) (HCC) 04/10/2013   Spinal stenosis of lumbar region with radiculopathy 07/12/2012   Inability to walk 07/12/2012   Meningioma of left sphenoid wing involving cavernous sinus (HCC) 02/17/2012   Rheumatoid arthritis (HCC) 01/28/2012   Primary osteoarthritis of right knee 01/28/2012   Dyslipidemia    Essential hypertension    PCP:  Mercer Clotilda SAUNDERS, MD Pharmacy:   [facility] [phone] - [place], [place] - [address] AT [facility] OF [address] [place] [address] Phone: [phone] Fax: [phone]     Social Drivers of Health (SDOH) Social History: SDOH Screenings   Food Insecurity: No Food Insecurity (11/14/2023)  Housing: Low Risk  (11/14/2023)  Transportation Needs: No Transportation  Needs (11/14/2023)  Utilities: Not At Risk (11/14/2023)  Alcohol  Screen: Low Risk  (10/30/2023)  Depression (PHQ2-9): Low Risk  (11/09/2023)  Recent Concern: Depression (PHQ2-9) - Medium Risk (10/15/2023)  Financial Resource Strain: Low Risk  (10/30/2023)  Physical Activity: Insufficiently Active (10/30/2023)  Social Connections: Socially Integrated (11/14/2023)  Stress: No Stress Concern Present (10/30/2023)  Tobacco Use: Low Risk  (11/15/2023)  Health Literacy: Adequate Health Literacy (10/30/2023)   SDOH Interventions:     Readmission Risk  Interventions    07/17/2023    3:28 PM 07/08/2023   10:09 AM 12/16/2022   11:09 AM  Readmission Risk Prevention Plan  Transportation Screening Complete Complete Complete  PCP or Specialist Appt within 5-7 Days   Complete  Home Care Screening   Complete  Medication Review (RN CM)   Complete  Medication Review (RN Care Manager) Complete Referral to Pharmacy   PCP or Specialist appointment within 3-5 days of discharge Complete Complete   HRI or Home Care Consult Complete Complete   SW Recovery Care/Counseling Consult Complete    Palliative Care Screening Not Applicable Not Applicable   Skilled Nursing Facility Complete Complete

## 2023-11-16 NOTE — Progress Notes (Signed)
 PROGRESS NOTE  [name] FMW:[id number] DOB: 1940-07-16   PCP: Mercer Clotilda SAUNDERS, MD  Patient is from: Home.  DOA: 11/13/2023 LOS: 3  Chief complaints Chief Complaint  Patient presents with   blood clot in arm    Arm Pain     Brief Narrative / Interim history: 83 year old F with PMH of GIB due to sigmoid stricture s/p colectomy and colostomy creation in 06/2023, PE not on anticoagulation due to GIB, nonobstructive CAD, chronic chest pain, COPD, chronic hypoxic RF on 3 L, HFpEF, IDDM-2, rheumatoid arthritis, stage II sacral pressure ulcer, morbid obesity, HTN, dyslipidemia, hypothyroidism and chronic sinus tachycardia brought to ED by EMS due to increased swelling and pain of left arm and left shoulder, as well as left-sided chest pain.  CT angio chest negative for PE but concerning for multifocal pneumonia, nondisplaced fracture of superior left glenoid fossa and septic arthritis in left shoulder.  CT angio LUE showed nondisplaced fracture of posterosuperior glenoid, large loculated peripherally enhancing fluid collection extending from left glenohumeral joint into proximal and mid biceps musculature measuring 5.3 x 3.6 x 7.5 cm concerning for septic arthritis and intramuscular abscess, questionable developing humeral head osteomyelitis.  Orthopedic surgery consulted and recommended IR drainage and Ortho evaluation the morning. Blood cultures obtained.  Started on IV fluid and  broad-spectrum antibiotics  Patient had diagnostic aspiration of LUE intramuscular abscess.  Blood and abscess culture with MRSA.  ID consulted.  She also underwent left shoulder hemiarthroplasty by Dr. Cristy on 6/29.  Subjective: Seen and examined earlier this afternoon after she returned from surgery.  No complaints.  Denies pain in left shoulder or arm.  No chest pain or shortness of breath.  Objective: Vitals:   11/16/23 0419 11/16/23 0752 11/16/23 1128 11/16/23 1132  BP: (!) 159/94 (!) 169/105 (!)  181/110 (!) 178/115  Pulse: 93 99 83 81  Resp: 19 19 14 19   Temp:  98.4 F (36.9 C) 98.7 F (37.1 C)   TempSrc:  Oral Oral   SpO2: 98% 98% 97% 100%  Weight:      Height:        Examination:  GENERAL: No apparent distress.  Nontoxic. HEENT: MMM.  Vision and hearing grossly intact.  NECK: Supple.  No apparent JVD.  RESP:  No IWOB.  Fair aeration bilaterally. CVS:  RRR. Heart sounds normal.  ABD/GI/GU: BS+. Abd soft, NTND.  Colostomy in place MSK/EXT:  Moves extremities.  LUE in sling.  Dressing over anterior left shoulder.  Drain in place. SKIN: As above. NEURO: AA.  Oriented appropriately.  No apparent focal neuro deficit. PSYCH: Calm. Normal affect.   Consultants:  Orthopedic surgery Interventional radiology Infectious disease  Procedures: 6/28-aspiration of LUE intramuscular abscess 6/29-left shoulder hemiarthroplasty by Dr. Cristy  Microbiology summarized: 6/27-blood cultures with MRSA 6/28-A-20 pathogen RVP nonreactive 6/28-LLE abscess culture with moderate Staph aureus 6/29-repeat blood cultures NGTD. 6/29-surgical culture pending   Assessment and plan: Sepsis: Present on arrival.  Has tachycardia and tachypnea.  Source of infection as below. Left shoulder septic arthritis Left glenohumeral joint abscess formation Left upper extremity intramuscular abscess Nondisplaced fracture of posterior superior glenoid MRSA bacteremia-blood cultures as above. -Imaging as above.  CRP 39.  ESR 106.  Hemodynamically stable. -S/p IR aspiration of LUE intramuscular abscess by IR on 6/28 -S/p left shoulder hemiarthroplasty by Dr. Cristy -On IV vancomycin .  Cefepime  and Flagyl  discontinued. -Repeat blood culture from 6/29 NGTD. -Follow  surgical tissue cultures -TTE to rule out endocarditis -IR aspiration of  abscess today.  -Pain control per orthopedic surgery -Contact precaution   Community-acquired pneumonia: Reported left-sided chest pain but no shortness of breath.   Stable on home oxygen .  CT angio chest concerning for multifocal pneumonia.  Could be septic emboli. -Antibiotics as above -Pulmonary toilet  Left-sided chest pain: Likely due to pneumonia and left shoulder infection.  Mildly elevated troponin without significant delta.  No acute ischemic finding on EKG.  Has underlying chronic chest pain as well. -Manage infection as above.   Elevated troponin/history of nonobstructive CAD: Very mild without significant delta.  Likely demand ischemia.  Normal LHC in 07/2021. - Continue Lipitor and Lopressor   Chronic HFpEF?: TTE in 07/2023 with LVEF of 55 to 60%, and normal diastolic parameters although G1DD in 05/2023.  Appears euvolemic but difficult to assess given body habitus. -Continue home Lasix  and metoprolol . -Monitor fluid status and respiratory status -Follow echocardiogram   Chronic COPD/chronic hypoxic RF on 3 L at baseline.  Stable.  Does not seem to be on breathing treatments at home. -Bronchodilators as needed -Continue low-dose prednisone  she takes for RA.  Essential hypertension: BP elevated this morning -Increase metoprolol  -P.o. hydralazine  as needed   Chronic pain syndrome/neuropathy -Continue home MS Contin -, Robaxin  and gabapentin    IDDM-2 with hyperglycemia and level 1 hypoglycemia: A1c 6.0% in 09/2023. Recent Labs  Lab 11/15/23 1019 11/15/23 1628 11/15/23 2108 11/16/23 0627 11/16/23 1117  GLUCAP 98 212* 150* 107* 112*  -Continue SSI-sensitive  Rheumatoid arthritis: On p.o. prednisone , Arava  and Plaquenil  at home. -Continue p.o. prednisone .  No signs of adrenal insufficiency. -Hold Arava  and Plaquenil  in the setting of active infection  Normocytic anemia: denies melena or hematochezia.  Not on anticoagulation.  Unsure about surgical EBL.  Op note pending.  Transfused 1 unit this morning. Recent Labs    08/03/23 0546 08/12/23 1303 08/12/23 1727 08/13/23 0501 08/14/23 0419 10/15/23 1455 11/13/23 1301 11/14/23 0512  11/15/23 0241 11/16/23 0226  HGB 11.2* 14.4 12.4 11.4* 10.4* 9.6* 8.9* 8.5* 7.5* 6.9*  -Follow postop H&H -Monitor H&H.  Transfuse for Hgb <7.0.   History of PE: Not on anticoagulation due to GI bleed.  CTA chest negative for PE.   History of sigmoid colon stricture s/p colectomy and colostomy creation February 2025 -Continue colostomy care.   Hyperlipidemia -Continue Lipitor   Chronic constipation -Continue MiraLAX  and senna code   GERD -Continue Protonix    Generalized anxiety disorder -Continue Cymbalta   Hypothyroidism -Continue levothyroxine   Class I obesity Body mass index is 34.14 kg/m.         DVT prophylaxis:  heparin  injection 5,000 Units Start: 11/13/23 2200 SCDs Start: 11/13/23 2123 Place TED hose Start: 11/13/23 2123  Code Status: Full code Family Communication: None at bedside today. Level of care: Telemetry Cardiac Status is: Inpatient Remains inpatient appropriate because: Sepsis in the setting of left shoulder infection/abscess and MRSA bacteremia   Final disposition: To be determined   55 minutes with more than 50% spent in reviewing records, counseling patient/family and coordinating care.   Sch Meds:  Scheduled Meds:  atorvastatin   10 mg Oral Daily   cholecalciferol   1,000 Units Oral Daily   DULoxetine   60 mg Oral Daily   furosemide   40 mg Oral Daily   gabapentin   300 mg Oral BID   guaiFENesin   1,200 mg Oral BID   heparin   5,000 Units Subcutaneous Q8H   insulin  aspart  0-5 Units Subcutaneous QHS   insulin  aspart  0-9 Units Subcutaneous TID WC   levothyroxine   50 mcg Oral Q0600   lidocaine  (PF)  8 mL Intradermal Once   metoprolol  tartrate  50 mg Oral BID   morphine   15 mg Oral Q12H   pantoprazole   40 mg Oral BID   predniSONE   10 mg Oral Q breakfast   sodium chloride  flush  3 mL Intravenous Q12H   sodium chloride  flush  3 mL Intravenous Q12H   Continuous Infusions:  DAPTOmycin     PRN Meds:.acetaminophen , bisacodyl ,  hyoscyamine , ipratropium-albuterol , LORazepam , methocarbamol , ondansetron  (ZOFRAN ) IV, polyethylene glycol, senna, sodium chloride  flush  Antimicrobials: Anti-infectives (From admission, onward)    Start     Dose/Rate Route Frequency Ordered Stop   11/16/23 1400  DAPTOmycin (CUBICIN) IVPB 700 mg/189mL premix        8 mg/kg  90.3 kg 200 mL/hr over 30 Minutes Intravenous Daily 11/16/23 0927     11/15/23 2130  vancomycin  (VANCOREADY) IVPB 1750 mg/350 mL  Status:  Discontinued       Placed in Followed by Linked Group   1,750 mg 175 mL/hr over 120 Minutes Intravenous Every 48 hours 11/13/23 2124 11/16/23 0927   11/15/23 0914  vancomycin  (VANCOCIN ) powder  Status:  Discontinued          As needed 11/15/23 0915 11/15/23 0958   11/15/23 0600  ceFAZolin  (ANCEF ) IVPB 2g/100 mL premix        2 g 200 mL/hr over 30 Minutes Intravenous On call to O.R. 11/14/23 1837 11/15/23 0904   11/14/23 1115  metroNIDAZOLE  (FLAGYL ) IVPB 500 mg  Status:  Discontinued        500 mg 100 mL/hr over 60 Minutes Intravenous Every 12 hours 11/14/23 1107 11/15/23 0656   11/13/23 2130  ceFEPIme  (MAXIPIME ) 2 g in sodium chloride  0.9 % 100 mL IVPB  Status:  Discontinued        2 g 200 mL/hr over 30 Minutes Intravenous Every 12 hours 11/13/23 2124 11/15/23 0656   11/13/23 2130  vancomycin  (VANCOREADY) IVPB 2000 mg/400 mL       Placed in Followed by Linked Group   2,000 mg 200 mL/hr over 120 Minutes Intravenous  Once 11/13/23 2124 11/14/23 0236        I have personally reviewed the following labs and images: CBC: Recent Labs  Lab 11/13/23 1301 11/14/23 0512 11/15/23 0241 11/16/23 0226  WBC 10.5 11.2* 7.0 6.6  HGB 8.9* 8.5* 7.5* 6.9*  HCT 30.7* 29.7* 25.8* 23.1*  MCV 98.7 98.0 94.9 93.5  PLT 153 164 160 149*   BMP &GFR Recent Labs  Lab 11/13/23 1301 11/14/23 0512 11/15/23 0241 11/16/23 0226  NA 139 142 138 139  K 4.1 3.9 3.4* 4.5  CL 100 103 103 103  CO2 28 29 27 27   GLUCOSE 152* 88 82 107*  BUN  16 14 11 13   CREATININE 0.95 1.00 0.94 0.95  CALCIUM  8.0* 8.1* 7.5* 7.6*  MG  --   --  1.9 2.0  PHOS  --   --  3.2 3.5   Estimated Creatinine Clearance: 50.5 mL/min (by C-G formula based on SCr of 0.95 mg/dL). Liver & Pancreas: Recent Labs  Lab 11/14/23 0512 11/15/23 0241 11/16/23 0226  AST 19  --   --   ALT 22  --   --   ALKPHOS 129*  --   --   BILITOT 0.9  --   --   PROT 5.4*  --   --   ALBUMIN  2.0* 1.7* 1.8*   No results for input(s): LIPASE,  AMYLASE in the last 168 hours. No results for input(s): AMMONIA in the last 168 hours. Diabetic: No results for input(s): HGBA1C in the last 72 hours. Recent Labs  Lab 11/15/23 1019 11/15/23 1628 11/15/23 2108 11/16/23 0627 11/16/23 1117  GLUCAP 98 212* 150* 107* 112*   Cardiac Enzymes: No results for input(s): CKTOTAL, CKMB, CKMBINDEX, TROPONINI in the last 168 hours. No results for input(s): PROBNP in the last 8760 hours. Coagulation Profile: No results for input(s): INR, PROTIME in the last 168 hours. Thyroid  Function Tests: No results for input(s): TSH, T4TOTAL, FREET4, T3FREE, THYROIDAB in the last 72 hours. Lipid Profile: No results for input(s): CHOL, HDL, LDLCALC, TRIG, CHOLHDL, LDLDIRECT in the last 72 hours. Anemia Panel: No results for input(s): VITAMINB12, FOLATE, FERRITIN, TIBC, IRON , RETICCTPCT in the last 72 hours. Urine analysis:    Component Value Date/Time   COLORURINE YELLOW 07/15/2023 1616   APPEARANCEUR HAZY (A) 07/15/2023 1616   LABSPEC >1.046 (H) 07/15/2023 1616   PHURINE 5.0 07/15/2023 1616   GLUCOSEU NEGATIVE 07/15/2023 1616   HGBUR NEGATIVE 07/15/2023 1616   BILIRUBINUR NEGATIVE 07/15/2023 1616   BILIRUBINUR 1+ 01/30/2021 1450   KETONESUR 5 (A) 07/15/2023 1616   PROTEINUR NEGATIVE 07/15/2023 1616   UROBILINOGEN negative (A) 01/30/2021 1450   UROBILINOGEN 0.2 11/28/2019 1424   NITRITE NEGATIVE 07/15/2023 1616   LEUKOCYTESUR NEGATIVE  07/15/2023 1616   Sepsis Labs: Invalid input(s): PROCALCITONIN, LACTICIDVEN  Microbiology: Recent Results (from the past 240 hours)  Body fluid culture w Gram Stain     Status: None   Collection Time: 11/13/23  9:22 PM   Specimen: Abscess; Body Fluid  Result Value Ref Range Status   Specimen Description ABSCESS  Final   Special Requests JOINT,LEFT SHOULDER  Final   Gram Stain   Final    MODERATE WBC PRESENT,BOTH PMN AND MONONUCLEAR RARE GRAM POSITIVE COCCI Performed at [facility], [address] [place], [place] [address]    Culture   Final    MODERATE METHICILLIN RESISTANT STAPHYLOCOCCUS AUREUS   Report Status 11/16/2023 FINAL  Final   Organism ID, Bacteria METHICILLIN RESISTANT STAPHYLOCOCCUS AUREUS  Final      Susceptibility   Methicillin resistant staphylococcus aureus - MIC*    CIPROFLOXACIN >=8 RESISTANT Resistant     ERYTHROMYCIN >=8 RESISTANT Resistant     GENTAMICIN <=0.5 SENSITIVE Sensitive     OXACILLIN >=4 RESISTANT Resistant     TETRACYCLINE >=16 RESISTANT Resistant     VANCOMYCIN  1 SENSITIVE Sensitive     TRIMETH /SULFA  <=10 SENSITIVE Sensitive     CLINDAMYCIN >=8 RESISTANT Resistant     RIFAMPIN <=0.5 SENSITIVE Sensitive     Inducible Clindamycin NEGATIVE Sensitive     LINEZOLID 2 SENSITIVE Sensitive     * MODERATE METHICILLIN RESISTANT STAPHYLOCOCCUS AUREUS  Anaerobic culture w Gram Stain     Status: None (Preliminary result)   Collection Time: 11/13/23  9:22 PM   Specimen: Abscess  Result Value Ref Range Status   Specimen Description ABSCESS  Final   Special Requests JOINT,LEFT SHOULDER  Final   Gram Stain   Final    MODERATE WBC PRESENT,BOTH PMN AND MONONUCLEAR RARE GRAM POSITIVE COCCI Performed at [facility], [address] [place], [place] [address]    Culture PENDING  Incomplete   Report Status PENDING  Incomplete  Culture, blood (Routine X 2) w Reflex to ID Panel     Status: Abnormal (Preliminary result)   Collection Time:  11/13/23 11:31 PM  Specimen: BLOOD RIGHT HAND  Result Value Ref Range Status   Specimen Description BLOOD RIGHT HAND  Final   Special Requests   Final    BOTTLES DRAWN AEROBIC ONLY Blood Culture results may not be optimal due to an inadequate volume of blood received in culture bottles   Culture  Setup Time   Final    GRAM POSITIVE COCCI AEROBIC BOTTLE ONLY CRITICAL RESULT CALLED TO, READ BACK BY AND VERIFIED WITH: PHARMD S WISE 11/14/2023 @ 2132 BY AB    Culture (A)  Final    METHICILLIN RESISTANT STAPHYLOCOCCUS AUREUS CULTURE REINCUBATED FOR BETTER GROWTH Performed at [facility], [address] [place], [place] [address]    Report Status PENDING  Incomplete   Organism ID, Bacteria METHICILLIN RESISTANT STAPHYLOCOCCUS AUREUS  Final      Susceptibility   Methicillin resistant staphylococcus aureus - MIC*    CIPROFLOXACIN >=8 RESISTANT Resistant     ERYTHROMYCIN >=8 RESISTANT Resistant     GENTAMICIN <=0.5 SENSITIVE Sensitive     OXACILLIN >=4 RESISTANT Resistant     TETRACYCLINE >=16 RESISTANT Resistant     VANCOMYCIN  1 SENSITIVE Sensitive     TRIMETH /SULFA  <=10 SENSITIVE Sensitive     CLINDAMYCIN >=8 RESISTANT Resistant     RIFAMPIN <=0.5 SENSITIVE Sensitive     Inducible Clindamycin NEGATIVE Sensitive     LINEZOLID 2 SENSITIVE Sensitive     * METHICILLIN RESISTANT STAPHYLOCOCCUS AUREUS  Blood Culture ID Panel (Reflexed)     Status: Abnormal   Collection Time: 11/13/23 11:31 PM  Result Value Ref Range Status   Enterococcus faecalis NOT DETECTED NOT DETECTED Final   Enterococcus Faecium NOT DETECTED NOT DETECTED Final   Listeria monocytogenes NOT DETECTED NOT DETECTED Final   Staphylococcus species DETECTED (A) NOT DETECTED Final    Comment: CRITICAL RESULT CALLED TO, READ BACK BY AND VERIFIED WITH: PHARMD S WISE 11/14/2023 @ 2132 BY AB    Staphylococcus aureus (BCID) DETECTED (A) NOT DETECTED Final    Comment: Methicillin (oxacillin)-resistant Staphylococcus  aureus (MRSA). MRSA is predictably resistant to beta-lactam antibiotics (except ceftaroline). Preferred therapy is vancomycin  unless clinically contraindicated. Patient requires contact precautions if  hospitalized. CRITICAL RESULT CALLED TO, READ BACK BY AND VERIFIED WITH: PHARMD S WISE 11/14/2023 @ 2132 BY AB    Staphylococcus epidermidis NOT DETECTED NOT DETECTED Final   Staphylococcus lugdunensis NOT DETECTED NOT DETECTED Final   Streptococcus species NOT DETECTED NOT DETECTED Final   Streptococcus agalactiae NOT DETECTED NOT DETECTED Final   Streptococcus pneumoniae NOT DETECTED NOT DETECTED Final   Streptococcus pyogenes NOT DETECTED NOT DETECTED Final   A.calcoaceticus-baumannii NOT DETECTED NOT DETECTED Final   Bacteroides fragilis NOT DETECTED NOT DETECTED Final   Enterobacterales NOT DETECTED NOT DETECTED Final   Enterobacter cloacae complex NOT DETECTED NOT DETECTED Final   Escherichia coli NOT DETECTED NOT DETECTED Final   Klebsiella aerogenes NOT DETECTED NOT DETECTED Final   Klebsiella oxytoca NOT DETECTED NOT DETECTED Final   Klebsiella pneumoniae NOT DETECTED NOT DETECTED Final   Proteus species NOT DETECTED NOT DETECTED Final   Salmonella species NOT DETECTED NOT DETECTED Final   Serratia marcescens NOT DETECTED NOT DETECTED Final   Haemophilus influenzae NOT DETECTED NOT DETECTED Final   Neisseria meningitidis NOT DETECTED NOT DETECTED Final   Pseudomonas aeruginosa NOT DETECTED NOT DETECTED Final   Stenotrophomonas maltophilia NOT DETECTED NOT DETECTED Final   Candida albicans NOT DETECTED NOT DETECTED Final   Candida auris NOT DETECTED NOT DETECTED Final  Candida glabrata NOT DETECTED NOT DETECTED Final   Candida krusei NOT DETECTED NOT DETECTED Final   Candida parapsilosis NOT DETECTED NOT DETECTED Final   Candida tropicalis NOT DETECTED NOT DETECTED Final   Cryptococcus neoformans/gattii NOT DETECTED NOT DETECTED Final   Meth resistant mecA/C and MREJ DETECTED  (A) NOT DETECTED Final    Comment: CRITICAL RESULT CALLED TO, READ BACK BY AND VERIFIED WITH: PHARMD S WISE 11/14/2023 @ 2132 BY AB Performed at [facility], [address] [place], [place] [address]   Culture, blood (Routine X 2) w Reflex to ID Panel     Status: Abnormal   Collection Time: 11/13/23 11:48 PM   Specimen: BLOOD RIGHT FOREARM  Result Value Ref Range Status   Specimen Description BLOOD RIGHT FOREARM  Final   Special Requests   Final    BOTTLES DRAWN AEROBIC ONLY Blood Culture results may not be optimal due to an inadequate volume of blood received in culture bottles   Culture  Setup Time   Final    GRAM POSITIVE COCCI IN CLUSTERS AEROBIC BOTTLE ONLY CRITICAL VALUE NOTED.  VALUE IS CONSISTENT WITH PREVIOUSLY REPORTED AND CALLED VALUE.    Culture (A)  Final    STAPHYLOCOCCUS AUREUS SUSCEPTIBILITIES PERFORMED ON PREVIOUS CULTURE WITHIN THE LAST 5 DAYS. Performed at [facility], [address] [place], [place] [address]    Report Status 11/16/2023 FINAL  Final  Respiratory (~20 pathogens) panel by PCR     Status: None   Collection Time: 11/14/23  5:12 AM   Specimen: Nasopharyngeal Swab; Respiratory  Result Value Ref Range Status   Adenovirus NOT DETECTED NOT DETECTED Final   Coronavirus 229E NOT DETECTED NOT DETECTED Final    Comment: (NOTE) The Coronavirus on the Respiratory Panel, DOES NOT test for the novel  Coronavirus (2019 nCoV)    Coronavirus HKU1 NOT DETECTED NOT DETECTED Final   Coronavirus NL63 NOT DETECTED NOT DETECTED Final   Coronavirus OC43 NOT DETECTED NOT DETECTED Final   Metapneumovirus NOT DETECTED NOT DETECTED Final   Rhinovirus / Enterovirus NOT DETECTED NOT DETECTED Final   Influenza A NOT DETECTED NOT DETECTED Final   Influenza B NOT DETECTED NOT DETECTED Final   Parainfluenza Virus 1 NOT DETECTED NOT DETECTED Final   Parainfluenza Virus 2 NOT DETECTED NOT DETECTED Final   Parainfluenza Virus 3 NOT DETECTED NOT DETECTED Final    Parainfluenza Virus 4 NOT DETECTED NOT DETECTED Final   Respiratory Syncytial Virus NOT DETECTED NOT DETECTED Final   Bordetella pertussis NOT DETECTED NOT DETECTED Final   Bordetella Parapertussis NOT DETECTED NOT DETECTED Final   Chlamydophila pneumoniae NOT DETECTED NOT DETECTED Final   Mycoplasma pneumoniae NOT DETECTED NOT DETECTED Final    Comment: Performed at [facility], [address] [place], [place] [address]  Aerobic/Anaerobic Culture w Gram Stain (surgical/deep wound)     Status: None (Preliminary result)   Collection Time: 11/14/23 11:05 AM   Specimen: Abscess  Result Value Ref Range Status   Specimen Description ABSCESS  Final   Special Requests ABSCESS  Final   Gram Stain   Final    ABUNDANT WBC PRESENT,BOTH PMN AND MONONUCLEAR RARE GRAM POSITIVE COCCI    Culture   Final    MODERATE STAPHYLOCOCCUS AUREUS SUSCEPTIBILITIES TO FOLLOW Performed at [facility], [address] [place], [place] [address]    Report Status PENDING  Incomplete  MRSA Next Gen by PCR, Nasal  Status: Abnormal   Collection Time: 11/15/23  6:33 AM   Specimen: Nasal Mucosa; Nasal Swab  Result Value Ref Range Status   MRSA by PCR Next Gen DETECTED (A) NOT DETECTED Final    Comment: RESULT CALLED TO, READ BACK BY AND VERIFIED WITH: MERILEE HUA, RN  [phone] [id number] BY JINNY COMMON, [place] (NOTE) The GeneXpert MRSA Assay (FDA approved for NASAL specimens only), is one component of a comprehensive MRSA colonization surveillance program. It is not intended to diagnose MRSA infection nor to guide or monitor treatment for MRSA infections. Test performance is not FDA approved in patients less than 54 years old. Performed at [facility], [address], [place], [place] [address]   Aerobic/Anaerobic Culture w Gram Stain (surgical/deep wound)     Status: None (Preliminary result)   Collection Time: 11/15/23  9:27 AM   Specimen: Path Tissue  Result Value Ref Range Status   Specimen  Description TISSUE  Final   Special Requests NONE  Final   Gram Stain NO WBC SEEN NO ORGANISMS SEEN   Final   Culture   Final    CULTURE REINCUBATED FOR BETTER GROWTH Performed at [facility], [address] [place], [place] [address]    Report Status PENDING  Incomplete  Aerobic/Anaerobic Culture w Gram Stain (surgical/deep wound)     Status: None (Preliminary result)   Collection Time: 11/15/23  9:28 AM   Specimen: Path Tissue  Result Value Ref Range Status   Specimen Description TISSUE  Final   Special Requests NONE  Final   Gram Stain NO WBC SEEN NO ORGANISMS SEEN   Final   Culture   Final    CULTURE REINCUBATED FOR BETTER GROWTH Performed at [facility], [address], [place], [place] [address]    Report Status PENDING  Incomplete  Culture, blood (Routine X 2) w Reflex to ID Panel     Status: None (Preliminary result)   Collection Time: 11/15/23  1:58 PM   Specimen: BLOOD RIGHT HAND  Result Value Ref Range Status   Specimen Description BLOOD RIGHT HAND  Final   Special Requests   Final    BOTTLES DRAWN AEROBIC AND ANAEROBIC Blood Culture adequate volume   Culture   Final    NO GROWTH < 24 HOURS Performed at [facility], [address] [place], [place] [address]    Report Status PENDING  Incomplete  Culture, blood (Routine X 2) w Reflex to ID Panel     Status: None (Preliminary result)   Collection Time: 11/15/23  2:54 PM   Specimen: BLOOD RIGHT ARM  Result Value Ref Range Status   Specimen Description BLOOD RIGHT ARM  Final   Special Requests   Final    BOTTLES DRAWN AEROBIC AND ANAEROBIC Blood Culture results may not be optimal due to an inadequate volume of blood received in culture bottles   Culture   Final    NO GROWTH < 24 HOURS Performed at [facility], [address] [place], [place] [address]    Report Status PENDING  Incomplete    Radiology Studies: No results found.     Katlin Ciszewski T. Trenda Corliss [facility]  If  7PM-7AM, please contact night-coverage [email or web address] 11/16/2023, 1:31 PM

## 2023-11-16 NOTE — Plan of Care (Signed)
  Problem: Coping: Goal: Ability to adjust to condition or change in health will improve Outcome: Progressing   Problem: Fluid Volume: Goal: Ability to maintain a balanced intake and output will improve Outcome: Progressing   Problem: Metabolic: Goal: Ability to maintain appropriate glucose levels will improve Outcome: Progressing   Problem: Nutritional: Goal: Maintenance of adequate nutrition will improve Outcome: Progressing   Problem: Skin Integrity: Goal: Risk for impaired skin integrity will decrease Outcome: Progressing   

## 2023-11-16 NOTE — Op Note (Signed)
 Orthopaedic Surgery Operative Note (CSN: [id number])  [name]  Oct 11, 1940 Date of Surgery: 11/15/2023   Diagnoses:  left septic shoulder with abscess and osteomyelitis   Procedure: Left shoulder hemiarthroplasty with antibiotic cement Open synovectomy   Operative Finding Successful completion of planned procedure.  Patient a large abscess anteriorly about 8 x 10 cm in size.  There was no rotator cuff to speak of either subscap supra or infraspinatus.  A small amount of teres minor appeared to be hanging on.  There was significant infectious tissue and purulence throughout the joint as well as in the posterior aspect of the glenoid.  We palpated subcoracoid as well as in the subacromial space.  Antibiotic cement placed around our our stem.  Patient will not predictably need another surgery however she has continued symptoms we may have to repeat imaging and consider further washout.  There is a opportunity for reverse shoulder arthroplasty in the future if she is appropriate for this from medical standpoint and desires this.  Post-operative plan: The patient will be NWB in sling.  The patient will be will be admitted to observation due to medical complexity, monitoring and pain management.  DVT prophylaxis Lovenox  40 mg/day until mobilizing and then consider transition in clinic to alternative medicines.  Pain control with PRN pain medication preferring oral medicines.  Follow up plan will be scheduled in approximately 7 days for incision check and XR.  Physical therapy to start for lower extremities immobilization purposes.  Implants: Tornier perform humeral long stem size 1, 50x22 head  Post-Op Diagnosis: Same Surgeons:Primary: Cristy Bonner DASEN, MD Assistants:Caroline McBane, PA-C Location: [facility] OR ROOM 08 Anesthesia: General with Exparel  Interscalene Antibiotics: Ancef  2g preop, Vancomycin  1000mg  locally Tourniquet time: None Estimated Blood Loss: 100 Complications: None Specimens: 2  for culture hold for 2 weeks to rule out C acnes Implants: Implant Name Type Inv. Item Serial No. Manufacturer Lot No. LRB No. Used Action  CEMENT BONE DEPUY - [id number] Cement CEMENT BONE DEPUY  DEPUY ORTHOPAEDICS [id number] Left 1 Implanted  STEM HUMERAL PLUS LONG 1+ - [id number] Orthopedic Implant STEM HUMERAL PLUS LONG 1+  [employer] [id number] Left 1 Implanted  TORNIER PERFORM HUMERAL SYSTEM HUMERAL HEAD    STRYKER ORTH[employer] [id number] Left 1 Implanted  TORNIER PERFORM HUMERAL SYSTEM HUMERAL HEAD COUPLER    STRYKER ORTH[employer] [id number] Left 1 Implanted    Indications for Surgery:   [name] is a 83 y.o. female with septic shoulder with concern for osteomyelitis of the humeral head.  Benefits and risks of operative and nonoperative management were discussed prior to surgery with patient/guardian(s) and informed consent form was completed.  Infection and need for further surgery were discussed as was prosthetic stability and cuff issues.  We additionally specifically discussed risks of axillary nerve injury, infection, periprosthetic fracture, continued pain and longevity of implants prior to beginning procedure.      Procedure:   The patient was identified in the preoperative holding area where the surgical site was marked. Block placed by anesthesia with exparel .  The patient was taken to the OR where a procedural timeout was called and the above noted anesthesia was induced.  The patient was positioned beachchair on allen table with spider arm positioner.  Preoperative antibiotics were dosed.  The patient's left shoulder was prepped and draped in the usual sterile fashion.  A second preoperative timeout was called.       Standard deltopectoral approach was performed with a #10 blade. We dissected down to  the subcutaneous tissues and the cephalic vein was taken laterally with the deltoid. Clavipectoral fascia was incised in line with the incision. Deep retractors were placed.    There was significant purulent material tracking down the bicipital groove.  There was a large abscess anteriorly with the after mentioned size.  We excisionally debrided fascia, muscle, tendon.  We identified that the infection tracked throughout the joint.  The humeral head had softening and concern for osteomyelitis.  Perform an osteotomy using a 20 degree cutting guide from the [place] system.  We are able to send this off for specimen.  This allowed us  to expose the glenoid.  Synovectomy was performed of the entirety of the joint taking care to avoid the axillary nerve.  Placement retractors around the axillary nerve.  At this point we irrigated with 3 L normal saline and placed Irrisept solution and irrigated again.  Once there is no sign of unhealthy tissue and we were down to bleeding healthy tissue we sized and placed broaches for a size 1 long stem.  We mixed cement in the form antibiotic cement were able to place this around her stem.  Once this was complete we placed our final implants as above.  We had a small amount of capsule that was closed with a #5 FiberWire and closed incision multilayer fashion after placing a Hemovac drain.  Nylon suture was used in the skin.  Patient awoken taken back in stable condition.    Aleck Stalling, PA-C, present and scrubbed throughout the case, critical for completion in a timely fashion, and for retraction, instrumentation, closure.

## 2023-11-16 NOTE — Plan of Care (Signed)
  Problem: Nutritional: Goal: Maintenance of adequate nutrition will improve Outcome: Progressing Goal: Progress toward achieving an optimal weight will improve Outcome: Progressing   Problem: Nutrition: Goal: Adequate nutrition will be maintained Outcome: Progressing   Problem: Coping: Goal: Level of anxiety will decrease Outcome: Progressing   Problem: Elimination: Goal: Will not experience complications related to bowel motility Outcome: Progressing Goal: Will not experience complications related to urinary retention Outcome: Progressing   Problem: Safety: Goal: Ability to remain free from injury will improve Outcome: Progressing   Problem: Skin Integrity: Goal: Risk for impaired skin integrity will decrease Outcome: Progressing

## 2023-11-16 NOTE — Consult Note (Signed)
 WOC Nurse Consult Note: Reason for Consult: heel pressure injury and ostomy  Wound type: Deep Tissue Pressure Injury: left heel Pressure Injury POA: Yes Measurement: see nursing flow sheets Wound bed: dark purple non blanchable tissue Drainage (amount, consistency, odor) see nursing flowsheet Periwound: intact Dressing procedure/placement/frequency: Cleanse heel wound with saline, pat dry Cover with single layer of xeroform gauze, top with foam. Change every other day PRevalon boot to offload heel at all times.   Ostomy care orders provided 6/28  Re consult if needed, will not follow at this time. Thanks  Varshini Arrants [employer], RN,CWOCN, CNS, CWON-AP [phone])

## 2023-11-16 NOTE — Evaluation (Signed)
 Physical Therapy Evaluation Patient Details Name: [name] MRN: [id number] DOB: 1940/05/28 Today's Date: 11/16/2023  History of Present Illness  83 y.o. female admitted 6/27 from home reporting increased swelling  and pain in L arm. Negative for DVT. CT showed nondisplaced fracture of the posterior superior glenoid, a large loculated fluid collection from the glenohumeral joint into the proximal and mid biceps. S/p hemiarthroplasty  6/29. PMH: DM, HTN, CAD, RA,  s/p  colectomy with colostomy creation, CHF, COPD  Clinical Impression  Pt s/p L hemiarthroplasty with decreased functional use of LUE. Pt requiring moderate assist (+2) for bed mobility and transfers to standing. Able to take a couple lateral steps edge of bed with increased time and effort. Pt displays decreased skin integrity with unstageable pressure injury on left heel, generalized weakness, impaired standing balance, and decreased activity tolerance. Patient will benefit from continued inpatient follow up therapy, <3 hours/day to address deficits, maximize functional mobility and decrease caregiver burden.      If plan is discharge home, recommend the following: A lot of help with walking and/or transfers;A lot of help with bathing/dressing/bathroom   Can travel by private vehicle   No    Equipment Recommendations None recommended by PT (pt equipped)  Recommendations for Other Services       Functional Status Assessment Patient has had a recent decline in their functional status and demonstrates the ability to make significant improvements in function in a reasonable and predictable amount of time.     Precautions / Restrictions Precautions Precautions: Fall;Shoulder Type of Shoulder Precautions: hemiarthroplasty Shoulder Interventions: At all times Recall of Precautions/Restrictions: Impaired Precaution/Restrictions Comments: hemovac drain, allowed hand wrist and elbow movement Required Braces or Orthoses:  Sling Restrictions Weight Bearing Restrictions Per Provider Order: Yes LUE Weight Bearing Per Provider Order: Non weight bearing      Mobility  Bed Mobility Overal bed mobility: Needs Assistance Bed Mobility: Rolling, Supine to Sit, Sit to Supine Rolling: Mod assist   Supine to sit: +2 for physical assistance, Mod assist Sit to supine: Mod assist, +2 for physical assistance, +2 for safety/equipment   General bed mobility comments: pt educated on nwb L UE so exiting on the R side of the bed. pt initiates the task and (A) for trunk to static sit. pt CGA static sitting. pt needs tactile input and BLE (A) for return to supine due to shoulder safety    Transfers Overall transfer level: Needs assistance Equipment used: 2 person hand held assist Transfers: Sit to/from Stand Sit to Stand: +2 physical assistance, Min assist, From elevated surface           General transfer comment: stepping toward HOB mod (A) +2    Ambulation/Gait                  Stairs            Wheelchair Mobility     Tilt Bed    Modified Rankin (Stroke Patients Only)       Balance Overall balance assessment: Needs assistance Sitting-balance support: No upper extremity supported, Feet supported Sitting balance-Leahy Scale: Fair     Standing balance support: No upper extremity supported, During functional activity Standing balance-Leahy Scale: Poor                               Pertinent Vitals/Pain Pain Assessment Pain Assessment: No/denies pain    Home Living Family/patient expects to be  discharged to:: Private residence                 Home Equipment: Agricultural consultant (2 wheels);Hospital bed;BSC/3in1;Wheelchair - manual (air mattress) Additional Comments: the patient report that she was at skilled but the chart reports from home. Need clarification from family on prior level of function. daughter in room did not correct statement regarding skilled and  confirmed the patient has an air mattress    Prior Function Prior Level of Function : Needs assist  Cognitive Assist : ADLs (cognitive);Mobility (cognitive)           Mobility Comments: pt reports walking the bathroom and also reports stand pivot to the w/c then once in the bathroom standing ADLs Comments: pt reports completing     Extremity/Trunk Assessment   Upper Extremity Assessment Upper Extremity Assessment: Defer to OT evaluation LUE Deficits / Details: sling with drain in place, allowed hand wrist and elbow movement. pt with arm more laterally on body in sling due to body habitus and ostomy. pt needs cues to help with adducted to body and elbow 90 degrees    Lower Extremity Assessment Lower Extremity Assessment: Generalized weakness    Cervical / Trunk Assessment Cervical / Trunk Assessment: Kyphotic  Communication   Communication Communication: No apparent difficulties    Cognition Arousal: Alert Behavior During Therapy: WFL for tasks assessed/performed   PT - Cognitive impairments: Memory                       PT - Cognition Comments: pt tangential at times about long standing medical hx but does verbalize that she was walking 10 ft prior to admission. pt verbalized skilled nursing placement and recovery. pt reports increased time to communicate and prior SLP treatment for cognition. Following commands: Intact       Cueing Cueing Techniques: Verbal cues, Gestural cues, Visual cues     General Comments General comments (skin integrity, edema, etc.): VSS on 3L [place], pt has IV over R forearm access and did not take BP due to poor access point    Exercises Other Exercises Other Exercises: education on sling aligment and properly fit   Assessment/Plan    PT Assessment Patient needs continued PT services  PT Problem List Decreased strength;Decreased range of motion;Decreased balance;Decreased activity tolerance;Decreased mobility;Decreased  cognition       PT Treatment Interventions DME instruction;Gait training;Functional mobility training;Therapeutic activities;Therapeutic exercise;Balance training;Patient/family education    PT Goals (Current goals can be found in the Care Plan section)  Acute Rehab PT Goals Patient Stated Goal: to walk PT Goal Formulation: With patient Time For Goal Achievement: 11/30/23 Potential to Achieve Goals: Fair    Frequency Min 2X/week     Co-evaluation PT/OT/SLP Co-Evaluation/Treatment: Yes Reason for Co-Treatment: Complexity of the patient's impairments (multi-system involvement);Necessary to address cognition/behavior during functional activity;For patient/therapist safety PT goals addressed during session: Mobility/safety with mobility OT goals addressed during session: ADL's and self-care;Strengthening/ROM       AM-PAC PT 6 Clicks Mobility  Outcome Measure Help needed turning from your back to your side while in a flat bed without using bedrails?: A Lot Help needed moving from lying on your back to sitting on the side of a flat bed without using bedrails?: A Lot Help needed moving to and from a bed to a chair (including a wheelchair)?: A Lot Help needed standing up from a chair using your arms (e.g., wheelchair or bedside chair)?: A Lot Help needed to walk  in hospital room?: Total Help needed climbing 3-5 steps with a railing? : Total 6 Click Score: 10    End of Session Equipment Utilized During Treatment: Other (comment) (sling) Activity Tolerance: Patient tolerated treatment well Patient left: in bed;with call bell/phone within reach;with bed alarm set Nurse Communication: Mobility status PT Visit Diagnosis: Unsteadiness on feet (R26.81);Muscle weakness (generalized) (M62.81);Pain Pain - Right/Left: Left Pain - part of body: Shoulder    Time: 8755-8677 PT Time Calculation (min) (ACUTE ONLY): 38 min   Charges:   PT Evaluation $PT Eval Moderate Complexity: 1 Mod    PT General Charges $$ ACUTE PT VISIT: 1 Visit         Aleck Daring, PT, DPT Acute Rehabilitation Services Office [phone]   Alayne ONEIDA Daring 11/16/2023, 3:51 PM

## 2023-11-16 NOTE — Progress Notes (Signed)
 Hemoglobin of 6.9 was called by lab. Hospitalist notified. 1 unit of PRBCs ordered and transfused.

## 2023-11-17 ENCOUNTER — Encounter (HOSPITAL_COMMUNITY): Payer: Self-pay | Admitting: Orthopaedic Surgery

## 2023-11-17 DIAGNOSIS — R7881 Bacteremia: Secondary | ICD-10-CM | POA: Diagnosis not present

## 2023-11-17 DIAGNOSIS — M00012 Staphylococcal arthritis, left shoulder: Secondary | ICD-10-CM | POA: Diagnosis not present

## 2023-11-17 DIAGNOSIS — B9562 Methicillin resistant Staphylococcus aureus infection as the cause of diseases classified elsewhere: Secondary | ICD-10-CM | POA: Diagnosis not present

## 2023-11-17 LAB — BPAM RBC
Blood Product Expiration Date: 202507252359
ISSUE DATE / TIME: 202506300351
Unit Type and Rh: 5100

## 2023-11-17 LAB — CBC
HCT: 27.2 % — ABNORMAL LOW (ref 36.0–46.0)
Hemoglobin: 8.4 g/dL — ABNORMAL LOW (ref 12.0–15.0)
MCH: 28.7 pg (ref 26.0–34.0)
MCHC: 30.9 g/dL (ref 30.0–36.0)
MCV: 92.8 fL (ref 80.0–100.0)
Platelets: 194 10*3/uL (ref 150–400)
RBC: 2.93 MIL/uL — ABNORMAL LOW (ref 3.87–5.11)
RDW: 15.5 % (ref 11.5–15.5)
WBC: 7.9 10*3/uL (ref 4.0–10.5)
nRBC: 0 % (ref 0.0–0.2)

## 2023-11-17 LAB — TYPE AND SCREEN
ABO/RH(D): O POS
Antibody Screen: NEGATIVE
Unit division: 0

## 2023-11-17 LAB — RENAL FUNCTION PANEL
Albumin: 1.8 g/dL — ABNORMAL LOW (ref 3.5–5.0)
Anion gap: 10 (ref 5–15)
BUN: 13 mg/dL (ref 8–23)
CO2: 26 mmol/L (ref 22–32)
Calcium: 7.8 mg/dL — ABNORMAL LOW (ref 8.9–10.3)
Chloride: 103 mmol/L (ref 98–111)
Creatinine, Ser: 0.86 mg/dL (ref 0.44–1.00)
GFR, Estimated: >60 mL/min (ref >60)
Glucose, Bld: 89 mg/dL (ref 70–99)
Phosphorus: 2.2 mg/dL — ABNORMAL LOW (ref 2.5–4.6)
Potassium: 3.4 mmol/L — ABNORMAL LOW (ref 3.5–5.1)
Sodium: 139 mmol/L (ref 135–145)

## 2023-11-17 LAB — GLUCOSE, CAPILLARY
Glucose-Capillary: 80 mg/dL (ref 70–99)
Glucose-Capillary: 89 mg/dL (ref 70–99)
Glucose-Capillary: 117 mg/dL — ABNORMAL HIGH (ref 70–99)
Glucose-Capillary: 83 mg/dL (ref 70–99)

## 2023-11-17 LAB — MAGNESIUM: Magnesium: 1.9 mg/dL (ref 1.7–2.4)

## 2023-11-17 LAB — CK: Total CK: 39 U/L (ref 38–234)

## 2023-11-17 MED ORDER — OXYCODONE HCL 5 MG PO TABS: ORAL_TABLET | ORAL | 0 refills | Status: DC

## 2023-11-17 MED ORDER — APIXABAN 2.5 MG PO TABS: 2.5000 mg | ORAL_TABLET | Freq: Two times a day (BID) | ORAL | 0 refills | Status: DC

## 2023-11-17 MED ORDER — MELATONIN 5 MG PO TABS
5.0000 mg | ORAL_TABLET | Freq: Every evening | ORAL | Status: DC | PRN
  Administered 2023-11-17 – 2023-11-20 (×4): 5 mg via ORAL
  Filled 2023-11-17 (×5): qty 1

## 2023-11-17 NOTE — TOC Progression Note (Signed)
 Transition of Care [facility]) - Progression Note    Patient Details  Name: [name] MRN: [id number] Date of Birth: 12-Oct-1940  [facility] CM/SW Contact  Lauraine FORBES Saa, LCSW Phone Number: 11/17/2023, 2:41 PM  Clinical Narrative:     2:41 PM Per bedside RN, patient and patient's family expressed interest in patient discharging to SNF. CSW sent referral to SNFs within [facility].  Expected Discharge Plan: Skilled Nursing Facility Barriers to Discharge: Continued Medical Work up, SNF Pending bed offer  Expected Discharge Plan and Services In-house Referral: Clinical Social Work Discharge Planning Services: CM Consult Post Acute Care Choice: Skilled Nursing Facility Living arrangements for the past 2 months: Single Family Home                                       Social Determinants of Health (SDOH) Interventions SDOH Screenings   Food Insecurity: No Food Insecurity (11/14/2023)  Housing: Low Risk  (11/14/2023)  Transportation Needs: No Transportation Needs (11/14/2023)  Utilities: Not At Risk (11/14/2023)  Alcohol  Screen: Low Risk  (10/30/2023)  Depression (PHQ2-9): Low Risk  (11/09/2023)  Recent Concern: Depression (PHQ2-9) - Medium Risk (10/15/2023)  Financial Resource Strain: Low Risk  (10/30/2023)  Physical Activity: Insufficiently Active (10/30/2023)  Social Connections: Socially Integrated (11/14/2023)  Stress: No Stress Concern Present (10/30/2023)  Tobacco Use: Low Risk  (11/15/2023)  Health Literacy: Adequate Health Literacy (10/30/2023)    Readmission Risk Interventions    07/17/2023    3:28 PM 07/08/2023   10:09 AM 12/16/2022   11:09 AM  Readmission Risk Prevention Plan  Transportation Screening Complete Complete Complete  PCP or Specialist Appt within 5-7 Days   Complete  Home Care Screening   Complete  Medication Review (RN CM)   Complete  Medication Review (RN Care Manager) Complete Referral to Pharmacy   PCP or Specialist  appointment within 3-5 days of discharge Complete Complete   HRI or Home Care Consult Complete Complete   SW Recovery Care/Counseling Consult Complete    Palliative Care Screening Not Applicable Not Applicable   Skilled Nursing Facility Complete Complete

## 2023-11-17 NOTE — Progress Notes (Signed)
 [facility]  Date of Admission:  11/13/2023     Reason for Follow Up: Septic arthritis (HCC)  Total days of antibiotics 5         ASSESSMENT:  [name] is an 83 year old female presenting with left arm swelling and pain and found to have left shoulder septic arthritis with abscess complicated by MRSA bacteremia status post left shoulder hemiarthroplasty with antibiotic cement and open synovectomy.  Postop day #2 with appropriate surgical pain and blood cultures from 11/15/2023 without growth in 2 days.  Surgical specimens from 11/15/2023 remain incubated for better growth.  Discussed recommended plan of care to continue current dose of daptomycin.  Will need PICC line placement and 6 weeks of IV antimicrobial therapy and awaiting clearance of blood cultures for PICC line placement and final antibiotic recommendations.  Continue therapeutic drug monitoring of CK levels with most recent being normal at 39.  Postoperative wound care per orthopedics.  Again expresses concern about abilities to complete antibiotic therapy at home and may need rehabilitation/skilled nursing.  Remaining medical and supportive care per internal medicine.  PLAN:  Continue current dose of daptomycin. Therapeutic drug monitoring of CK levels. Monitor blood cultures for clearance of bacteremia and hold PICC line placement pending clearance. Monitor surgical specimens for any additional organisms Postoperative wound care per orthopedics. Remaining medical and supportive care per internal medicine.  Principal Problem:   Septic arthritis (HCC) Active Problems:   Rheumatoid arthritis (HCC)   Debility   Chronic hypoxic respiratory failure (HCC)   Depression   Insulin  dependent type 2 diabetes mellitus (HCC)   Chronic chest pain   Chronic pain syndrome   Diabetic neuropathy (HCC)   Sinus tachycardia   History of pulmonary embolus (PE)   Hypothyroidism   Hyperlipidemia   Chronic  diastolic CHF (congestive heart failure) (HCC)   GAD (generalized anxiety disorder)   SIRS (systemic inflammatory response syndrome) (HCC)   CAP (community acquired pneumonia)   Elevated troponin   History of GI bleed   History of CAD (coronary artery disease)   Stage IV pressure ulcer of sacral region (HCC)   GERD (gastroesophageal reflux disease)   Chronic constipation   MRSA bacteremia   Therapeutic drug monitoring    atorvastatin   10 mg Oral Daily   cholecalciferol   1,000 Units Oral Daily   DULoxetine   60 mg Oral Daily   furosemide   40 mg Oral Daily   gabapentin   300 mg Oral BID   guaiFENesin   1,200 mg Oral BID   heparin   5,000 Units Subcutaneous Q8H   insulin  aspart  0-5 Units Subcutaneous QHS   insulin  aspart  0-9 Units Subcutaneous TID WC   levothyroxine   50 mcg Oral Q0600   lidocaine  (PF)  8 mL Intradermal Once   metoprolol  tartrate  50 mg Oral BID   morphine   15 mg Oral Q12H   pantoprazole   40 mg Oral BID   predniSONE   10 mg Oral Q breakfast   sodium chloride  flush  3 mL Intravenous Q12H   sodium chloride  flush  3 mL Intravenous Q12H    SUBJECTIVE:  Afebrile overnight with no acute events.  Experiencing postsurgical pain that is adequately controlled.  Tolerating antibiotics with no adverse side effects.  Has questions about plan of care.   Allergies  Allergen Reactions   Codeine Swelling and Other (See Comments)    Facial and leg swelling Chest pain    Remicade [Infliximab] Anaphylaxis    sent  me into shock    Zestril [Lisinopril] Swelling and Rash    Face and neck swelling    Ofev  [Nintedanib] Diarrhea     Review of Systems: Review of Systems  Constitutional:  Negative for chills, fever and weight loss.  Respiratory:  Negative for cough, shortness of breath and wheezing.   Cardiovascular:  Negative for chest pain and leg swelling.  Gastrointestinal:  Negative for abdominal pain, constipation, diarrhea, nausea and vomiting.  Musculoskeletal:         Positive for left shoulder pain  Skin:  Negative for rash.      OBJECTIVE: Vitals:   11/17/23 0415 11/17/23 0723 11/17/23 0946 11/17/23 1145  BP: (!) 145/82 (!) 149/93 (!) 149/93 (!) 144/88  Pulse: 89 90 (!) 101 90  Resp: 18 (!) 29  16  Temp: 97.8 F (36.6 C) 98.5 F (36.9 C)  98.9 F (37.2 C)  TempSrc: Oral Oral  Oral  SpO2: 98% 98%  99%  Weight:      Height:       Body mass index is 34.14 kg/m.  Physical Exam Constitutional:      General: She is not in acute distress.    Appearance: She is well-developed.     Comments: Lying in bed with head of bed elevated; pleasant   Cardiovascular:     Rate and Rhythm: Normal rate and regular rhythm.     Heart sounds: Normal heart sounds.  Pulmonary:     Effort: Pulmonary effort is normal.     Breath sounds: Normal breath sounds.   Skin:    General: Skin is warm and dry.   Neurological:     Mental Status: She is alert.     Lab Results Lab Results  Component Value Date   WBC 7.9 11/17/2023   HGB 8.4 (L) 11/17/2023   HCT 27.2 (L) 11/17/2023   MCV 92.8 11/17/2023   PLT 194 11/17/2023    Lab Results  Component Value Date   CREATININE 0.86 11/17/2023   BUN 13 11/17/2023   NA 139 11/17/2023   K 3.4 (L) 11/17/2023   CL 103 11/17/2023   CO2 26 11/17/2023    Lab Results  Component Value Date   ALT 22 11/14/2023   AST 19 11/14/2023   ALKPHOS 129 (H) 11/14/2023   BILITOT 0.9 11/14/2023     Microbiology: Recent Results (from the past 240 hours)  Body fluid culture w Gram Stain     Status: None   Collection Time: 11/13/23  9:22 PM   Specimen: Abscess; Body Fluid  Result Value Ref Range Status   Specimen Description ABSCESS  Final   Special Requests JOINT,LEFT SHOULDER  Final   Gram Stain   Final    MODERATE WBC PRESENT,BOTH PMN AND MONONUCLEAR RARE GRAM POSITIVE COCCI Performed at [facility], [address], [place], [place] [address]    Culture   Final    MODERATE METHICILLIN RESISTANT  STAPHYLOCOCCUS AUREUS   Report Status 11/16/2023 FINAL  Final   Organism ID, Bacteria METHICILLIN RESISTANT STAPHYLOCOCCUS AUREUS  Final      Susceptibility   Methicillin resistant staphylococcus aureus - MIC*    CIPROFLOXACIN >=8 RESISTANT Resistant     ERYTHROMYCIN >=8 RESISTANT Resistant     GENTAMICIN <=0.5 SENSITIVE Sensitive     OXACILLIN >=4 RESISTANT Resistant     TETRACYCLINE >=16 RESISTANT Resistant     VANCOMYCIN  1 SENSITIVE Sensitive     TRIMETH /SULFA  <=10 SENSITIVE Sensitive  CLINDAMYCIN >=8 RESISTANT Resistant     RIFAMPIN <=0.5 SENSITIVE Sensitive     Inducible Clindamycin NEGATIVE Sensitive     LINEZOLID 2 SENSITIVE Sensitive     * MODERATE METHICILLIN RESISTANT STAPHYLOCOCCUS AUREUS  Anaerobic culture w Gram Stain     Status: None (Preliminary result)   Collection Time: 11/13/23  9:22 PM   Specimen: Abscess  Result Value Ref Range Status   Specimen Description ABSCESS  Final   Special Requests JOINT,LEFT SHOULDER  Final   Gram Stain   Final    MODERATE WBC PRESENT,BOTH PMN AND MONONUCLEAR RARE GRAM POSITIVE COCCI Performed at [facility], [address] [place], [place] [address]    Culture PENDING  Incomplete   Report Status PENDING  Incomplete  Culture, blood (Routine X 2) w Reflex to ID Panel     Status: Abnormal (Preliminary result)   Collection Time: 11/13/23 11:31 PM   Specimen: BLOOD RIGHT HAND  Result Value Ref Range Status   Specimen Description BLOOD RIGHT HAND  Final   Special Requests   Final    BOTTLES DRAWN AEROBIC ONLY Blood Culture results may not be optimal due to an inadequate volume of blood received in culture bottles   Culture  Setup Time   Final    GRAM POSITIVE COCCI AEROBIC BOTTLE ONLY CRITICAL RESULT CALLED TO, READ BACK BY AND VERIFIED WITH: PHARMD S WISE 11/14/2023 @ 2132 BY AB    Culture (A)  Final    METHICILLIN RESISTANT STAPHYLOCOCCUS AUREUS Sent to [facility] for further susceptibility testing. Performed at [facility], [address] [place], [place] [address]    Report Status PENDING  Incomplete   Organism ID, Bacteria METHICILLIN RESISTANT STAPHYLOCOCCUS AUREUS  Final      Susceptibility   Methicillin resistant staphylococcus aureus - MIC*    CIPROFLOXACIN >=8 RESISTANT Resistant     ERYTHROMYCIN >=8 RESISTANT Resistant     GENTAMICIN <=0.5 SENSITIVE Sensitive     OXACILLIN >=4 RESISTANT Resistant     TETRACYCLINE >=16 RESISTANT Resistant     VANCOMYCIN  1 SENSITIVE Sensitive     TRIMETH /SULFA  <=10 SENSITIVE Sensitive     CLINDAMYCIN >=8 RESISTANT Resistant     RIFAMPIN <=0.5 SENSITIVE Sensitive     Inducible Clindamycin NEGATIVE Sensitive     LINEZOLID 2 SENSITIVE Sensitive     * METHICILLIN RESISTANT STAPHYLOCOCCUS AUREUS  Blood Culture ID Panel (Reflexed)     Status: Abnormal   Collection Time: 11/13/23 11:31 PM  Result Value Ref Range Status   Enterococcus faecalis NOT DETECTED NOT DETECTED Final   Enterococcus Faecium NOT DETECTED NOT DETECTED Final   Listeria monocytogenes NOT DETECTED NOT DETECTED Final   Staphylococcus species DETECTED (A) NOT DETECTED Final    Comment: CRITICAL RESULT CALLED TO, READ BACK BY AND VERIFIED WITH: PHARMD S WISE 11/14/2023 @ 2132 BY AB    Staphylococcus aureus (BCID) DETECTED (A) NOT DETECTED Final    Comment: Methicillin (oxacillin)-resistant Staphylococcus aureus (MRSA). MRSA is predictably resistant to beta-lactam antibiotics (except ceftaroline). Preferred therapy is vancomycin  unless clinically contraindicated. Patient requires contact precautions if  hospitalized. CRITICAL RESULT CALLED TO, READ BACK BY AND VERIFIED WITH: PHARMD S WISE 11/14/2023 @ 2132 BY AB    Staphylococcus epidermidis NOT DETECTED NOT DETECTED Final   Staphylococcus lugdunensis NOT DETECTED NOT DETECTED Final   Streptococcus species NOT DETECTED NOT DETECTED Final   Streptococcus agalactiae NOT DETECTED NOT DETECTED Final   Streptococcus pneumoniae NOT DETECTED NOT  DETECTED Final   Streptococcus pyogenes NOT DETECTED NOT DETECTED Final   A.calcoaceticus-baumannii NOT DETECTED NOT DETECTED Final   Bacteroides fragilis NOT DETECTED NOT DETECTED Final   Enterobacterales NOT DETECTED NOT DETECTED Final   Enterobacter cloacae complex NOT DETECTED NOT DETECTED Final   Escherichia coli NOT DETECTED NOT DETECTED Final   Klebsiella aerogenes NOT DETECTED NOT DETECTED Final   Klebsiella oxytoca NOT DETECTED NOT DETECTED Final   Klebsiella pneumoniae NOT DETECTED NOT DETECTED Final   Proteus species NOT DETECTED NOT DETECTED Final   Salmonella species NOT DETECTED NOT DETECTED Final   Serratia marcescens NOT DETECTED NOT DETECTED Final   Haemophilus influenzae NOT DETECTED NOT DETECTED Final   Neisseria meningitidis NOT DETECTED NOT DETECTED Final   Pseudomonas aeruginosa NOT DETECTED NOT DETECTED Final   Stenotrophomonas maltophilia NOT DETECTED NOT DETECTED Final   Candida albicans NOT DETECTED NOT DETECTED Final   Candida auris NOT DETECTED NOT DETECTED Final   Candida glabrata NOT DETECTED NOT DETECTED Final   Candida krusei NOT DETECTED NOT DETECTED Final   Candida parapsilosis NOT DETECTED NOT DETECTED Final   Candida tropicalis NOT DETECTED NOT DETECTED Final   Cryptococcus neoformans/gattii NOT DETECTED NOT DETECTED Final   Meth resistant mecA/C and MREJ DETECTED (A) NOT DETECTED Final    Comment: CRITICAL RESULT CALLED TO, READ BACK BY AND VERIFIED WITH: PHARMD S WISE 11/14/2023 @ 2132 BY AB Performed at [facility], [address], [place], [place] [address]   Culture, blood (Routine X 2) w Reflex to ID Panel     Status: Abnormal   Collection Time: 11/13/23 11:48 PM   Specimen: BLOOD RIGHT FOREARM  Result Value Ref Range Status   Specimen Description BLOOD RIGHT FOREARM  Final   Special Requests   Final    BOTTLES DRAWN AEROBIC ONLY Blood Culture results may not be optimal due to an inadequate volume of blood received in culture bottles    Culture  Setup Time   Final    GRAM POSITIVE COCCI IN CLUSTERS AEROBIC BOTTLE ONLY CRITICAL VALUE NOTED.  VALUE IS CONSISTENT WITH PREVIOUSLY REPORTED AND CALLED VALUE.    Culture (A)  Final    STAPHYLOCOCCUS AUREUS SUSCEPTIBILITIES PERFORMED ON PREVIOUS CULTURE WITHIN THE LAST 5 DAYS. Performed at [facility], [address] [place], [place] [address]    Report Status 11/16/2023 FINAL  Final  Respiratory (~20 pathogens) panel by PCR     Status: None   Collection Time: 11/14/23  5:12 AM   Specimen: Nasopharyngeal Swab; Respiratory  Result Value Ref Range Status   Adenovirus NOT DETECTED NOT DETECTED Final   Coronavirus 229E NOT DETECTED NOT DETECTED Final    Comment: (NOTE) The Coronavirus on the Respiratory Panel, DOES NOT test for the novel  Coronavirus (2019 nCoV)    Coronavirus HKU1 NOT DETECTED NOT DETECTED Final   Coronavirus NL63 NOT DETECTED NOT DETECTED Final   Coronavirus OC43 NOT DETECTED NOT DETECTED Final   Metapneumovirus NOT DETECTED NOT DETECTED Final   Rhinovirus / Enterovirus NOT DETECTED NOT DETECTED Final   Influenza A NOT DETECTED NOT DETECTED Final   Influenza B NOT DETECTED NOT DETECTED Final   Parainfluenza Virus 1 NOT DETECTED NOT DETECTED Final   Parainfluenza Virus 2 NOT DETECTED NOT DETECTED Final   Parainfluenza Virus 3 NOT DETECTED NOT DETECTED Final   Parainfluenza Virus 4 NOT DETECTED NOT DETECTED Final   Respiratory Syncytial Virus NOT DETECTED NOT DETECTED Final   Bordetella pertussis  NOT DETECTED NOT DETECTED Final   Bordetella Parapertussis NOT DETECTED NOT DETECTED Final   Chlamydophila pneumoniae NOT DETECTED NOT DETECTED Final   Mycoplasma pneumoniae NOT DETECTED NOT DETECTED Final    Comment: Performed at [facility], [address], [place], [place] [address]  Aerobic/Anaerobic Culture w Gram Stain (surgical/deep wound)     Status: None (Preliminary result)   Collection Time: 11/14/23 11:05 AM   Specimen: Abscess   Result Value Ref Range Status   Specimen Description ABSCESS  Final   Special Requests ABSCESS  Final   Gram Stain   Final    ABUNDANT WBC PRESENT,BOTH PMN AND MONONUCLEAR RARE GRAM POSITIVE COCCI    Culture   Final    MODERATE STAPHYLOCOCCUS AUREUS SUSCEPTIBILITIES TO FOLLOW Performed at [facility], [address], [place], [place] [address]    Report Status PENDING  Incomplete  MRSA Next Gen by PCR, Nasal     Status: Abnormal   Collection Time: 11/15/23  6:33 AM   Specimen: Nasal Mucosa; Nasal Swab  Result Value Ref Range Status   MRSA by PCR Next Gen DETECTED (A) NOT DETECTED Final    Comment: RESULT CALLED TO, READ BACK BY AND VERIFIED WITH: MERILEE HUA, RN  [phone] [id number] BY JINNY COMMON, [place] (NOTE) The GeneXpert MRSA Assay (FDA approved for NASAL specimens only), is one component of a comprehensive MRSA colonization surveillance program. It is not intended to diagnose MRSA infection nor to guide or monitor treatment for MRSA infections. Test performance is not FDA approved in patients less than 46 years old. Performed at [facility], [address] [place], [place] [address]   Aerobic/Anaerobic Culture w Gram Stain (surgical/deep wound)     Status: None (Preliminary result)   Collection Time: 11/15/23  9:27 AM   Specimen: Path Tissue  Result Value Ref Range Status   Specimen Description TISSUE  Final   Special Requests NONE  Final   Gram Stain NO WBC SEEN NO ORGANISMS SEEN   Final   Culture   Final    CULTURE REINCUBATED FOR BETTER GROWTH Performed at [facility], [address] [place], [place] [address]    Report Status PENDING  Incomplete  Aerobic/Anaerobic Culture w Gram Stain (surgical/deep wound)     Status: None (Preliminary result)   Collection Time: 11/15/23  9:28 AM   Specimen: Path Tissue  Result Value Ref Range Status   Specimen Description TISSUE  Final   Special Requests NONE  Final   Gram Stain NO WBC SEEN NO ORGANISMS  SEEN   Final   Culture   Final    CULTURE REINCUBATED FOR BETTER GROWTH Performed at [facility], [address] [place], [place] [address]    Report Status PENDING  Incomplete  Culture, blood (Routine X 2) w Reflex to ID Panel     Status: None (Preliminary result)   Collection Time: 11/15/23  1:58 PM   Specimen: BLOOD RIGHT HAND  Result Value Ref Range Status   Specimen Description BLOOD RIGHT HAND  Final   Special Requests   Final    BOTTLES DRAWN AEROBIC AND ANAEROBIC Blood Culture adequate volume   Culture   Final    NO GROWTH 2 DAYS Performed at [facility], [address], [place], [place] [address]    Report Status PENDING  Incomplete  Culture, blood (Routine X 2) w Reflex to ID Panel     Status:  None (Preliminary result)   Collection Time: 11/15/23  2:54 PM   Specimen: BLOOD RIGHT ARM  Result Value Ref Range Status   Specimen Description BLOOD RIGHT ARM  Final   Special Requests   Final    BOTTLES DRAWN AEROBIC AND ANAEROBIC Blood Culture results may not be optimal due to an inadequate volume of blood received in culture bottles   Culture   Final    NO GROWTH 2 DAYS Performed at [facility], [address] [place], [place] [address]    Report Status PENDING  Incomplete   I have personally spent 28 minutes involved in face-to-face and non-face-to-face activities for this patient on the day of the visit. Professional time spent includes the following activities: preparing to see the patient (review of tests), performing a medically appropriate examination, ordering medications, communicating with other health care professionals, documenting clinical information in the EMR, communicating results and counseling patient regarding medication and plan of care, and care coordination.    Greg Khyler Eschmann, NP [facility]  11/17/2023  12:13 PM

## 2023-11-17 NOTE — NC FL2 (Signed)
 [place]  MEDICAID FL2 LEVEL OF CARE FORM     IDENTIFICATION  Patient Name: [name] Birthdate: Oct 09, 1940 Sex: female Admission Date (Current Location): 11/13/2023  [facility] and [place] Number:  Producer, television/film/video and Address:  [facility], [address] [place], [place] [address]      Provider Number: [id number]  Attending Physician Name and Address:  Luz Skelton, MD  Relative Name and Phone Number:  Morna Guess; Daughter; [phone]    Current Level of Care: Hospital Recommended Level of Care: Skilled Nursing Facility Prior Approval Number:    Date Approved/Denied:   PASRR Number: [id number]  Discharge Plan: SNF    Current Diagnoses: Patient Active Problem List   Diagnosis Date Noted   Therapeutic drug monitoring 11/16/2023   MRSA bacteremia 11/15/2023   Septic arthritis (HCC) 11/13/2023   SIRS (systemic inflammatory response syndrome) (HCC) 11/13/2023   CAP (community acquired pneumonia) 11/13/2023   Elevated troponin 11/13/2023   History of GI bleed 11/13/2023   History of CAD (coronary artery disease) 11/13/2023   Stage IV pressure ulcer of sacral region (HCC) 11/13/2023   GERD (gastroesophageal reflux disease) 11/13/2023   Chronic constipation 11/13/2023   Normocytic anemia 11/06/2023   LLQ abdominal pain 11/06/2023   Pain due to onychomycosis of toenails of both feet 09/22/2023   Sepsis (HCC) 08/12/2023   GAD (generalized anxiety disorder) 07/24/2023   Septic shock (HCC) 07/20/2023   Type 2 diabetes mellitus with diabetic polyneuropathy, without long-term current use of insulin  (HCC) 07/20/2023   Iron  deficiency anemia 07/20/2023   Hyperlipidemia 07/20/2023   Chronic diastolic CHF (congestive heart failure) (HCC) 07/20/2023   Abdominal pain 07/16/2023   GI bleed 07/15/2023   Enterocolitis 06/30/2023   Sepsis due to undetermined organism (HCC) 06/30/2023   Hypothyroidism 06/30/2023   GERD without esophagitis  06/30/2023   Peripheral neuropathy 06/30/2023   RSV (respiratory syncytial virus pneumonia) 06/26/2023   Ileus (HCC) 06/23/2023   Lower GI bleed 12/16/2022   BRBPR (bright red blood per rectum) 12/15/2022   [name] 09/08/2022   Abnormal CT scan, sigmoid colon 04/14/2022   Colon stricture (HCC) 04/14/2022   Diverticulosis of colon without hemorrhage 04/14/2022   History of pulmonary embolus (PE) 11/15/2021   Sinus tachycardia 10/22/2021   Pulmonary HTN (HCC)    Unspecified protein-calorie malnutrition (HCC) 04/05/2021   Pressure injury of skin 03/31/2021   Thrombocytopenia (HCC) 03/30/2021   Diabetic neuropathy (HCC) 05/24/2019   Hypokalemia 04/21/2019   Anticoagulated 06/25/2018   Chronic pain syndrome 06/25/2018   CRI (chronic renal insufficiency), stage 3 (moderate) (HCC) 06/25/2018   Drug-induced constipation 06/18/2018   DVT (deep venous thrombosis) (HCC) 06/03/2018   Chronic chest pain 06/02/2018   Primary osteoarthritis of both knees 05/26/2018   Body mass index (BMI) of 50-59.9 in adult (HCC) 05/26/2018   Acquired hypothyroidism 05/26/2018   Chronic bilateral low back pain without sciatica 02/17/2018   Astigmatism with presbyopia, bilateral 03/26/2017   Cortical age-related cataract of both eyes 03/26/2017   Family history of glaucoma 03/26/2017   Long term current use of oral hypoglycemic drug 03/26/2017   Nuclear sclerotic cataract of both eyes 03/26/2017   Pain in left hip 02/18/2017   Insulin  dependent type 2 diabetes mellitus (HCC) 01/16/2017   Gout 11/29/2016   Depression 11/29/2016   Physical deconditioning 07/31/2015   Pulmonary fibrosis, postinflammatory (HCC) 07/31/2015   Bronchiectasis without complication (HCC) 07/31/2015   Tendinitis of left rotator cuff  06/13/2015   Chronic heart failure with preserved ejection fraction (HFpEF) (HCC) 04/21/2015   AKI (acute kidney injury) (HCC)    Chronic hypoxic respiratory  failure (HCC) 02/09/2015   Dyspnea and respiratory abnormality 01/03/2015   Normal coronary arteries 11/02/2014   Morbid obesity (HCC) 10/30/2014   Dysphagia 08/21/2014   Debility 06/21/2014   DOE (dyspnea on exertion) 05/24/2014   Primary osteoarthritis of left knee 04/26/2014   High risk medication use 04/17/2014   Aphasia 02/12/2014   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) 01/18/2014   Lumbar facet arthropathy 01/18/2014   Bilateral edema of lower extremity 05/30/2013   Chronic cough 05/24/2013   ILD (interstitial lung disease) (HCC) 04/10/2013   Spinal stenosis of lumbar region with radiculopathy 07/12/2012   Inability to walk 07/12/2012   Meningioma of left sphenoid wing involving cavernous sinus (HCC) 02/17/2012   Rheumatoid arthritis (HCC) 01/28/2012   Primary osteoarthritis of right knee 01/28/2012   Dyslipidemia    Essential hypertension     Orientation RESPIRATION BLADDER Height & Weight     Self, Situation, Place  O2 (2L nasal cannula) Incontinent, External catheter Weight: 199 lb (90.3 kg) Height:  5' 4.02 (162.6 cm)  BEHAVIORAL SYMPTOMS/MOOD NEUROLOGICAL BOWEL NUTRITION STATUS      Incontinent, Colostomy Diet (Please see dischagre summary)  AMBULATORY STATUS COMMUNICATION OF NEEDS Skin   Extensive Assist Verbally PU Stage and Appropriate Care, Surgical wounds (Pressure Injury Heel L Unstageable - Full thickness tissue loss in which the base of the injury is covered by slough (yellow, tan, gray, green or brown) and/or eschar (tan, brown or black) in the wound bed; Wound Closed Surgical Incision Shoulder L)                       Personal Care Assistance Level of Assistance  Bathing, Dressing, Feeding Bathing Assistance: Maximum assistance Feeding assistance: Maximum assistance Dressing Assistance: Maximum assistance     Functional Limitations Info             SPECIAL CARE FACTORS FREQUENCY  PT (By licensed PT), OT (By licensed OT)      PT Frequency: 5x OT Frequency: 5x            Contractures Contractures Info: Not present    Additional Factors Info  Code Status, Allergies, Isolation Precautions, Psychotropic, Insulin  Sliding Scale Code Status Info: Full Code Allergies Info: Codeine; Remicade (Infliximab); Zestril (Lisinopril); Ofev  (Nintedanib) Psychotropic Info: Please see discharge summary Insulin  Sliding Scale Info: Please see discharge summary Isolation Precautions Info: Contact Precautions (MRSA)     Current Medications (11/17/2023):  This is the current hospital active medication list Current Facility-Administered Medications  Medication Dose Route Frequency Provider Last Rate Last Admin   acetaminophen  (TYLENOL ) tablet 650 mg  650 mg Oral Q6H PRN McBane, Caroline N, PA-C   650 mg at 11/17/23 0429   atorvastatin  (LIPITOR) tablet 10 mg  10 mg Oral Daily McBane, Caroline N, PA-C   10 mg at 11/17/23 9051   bisacodyl  (DULCOLAX) suppository 10 mg  10 mg Rectal Daily PRN McBane, Caroline N, PA-C       cholecalciferol  (VITAMIN D3) 25 MCG (1000 UNIT) tablet 1,000 Units  1,000 Units Oral Daily McBane, Caroline N, PA-C   1,000 Units at 11/17/23 0948   DAPTOmycin (CUBICIN) IVPB 700 mg/135mL premix  8 mg/kg Intravenous Q1400 Fleeta Rothman, Jomarie SAILOR, MD 200 mL/hr at 11/17/23 1432 700 mg at 11/17/23 1432   DULoxetine  (CYMBALTA ) DR capsule  60 mg  60 mg Oral Daily McBane, Caroline N, PA-C   60 mg at 11/17/23 9051   furosemide  (LASIX ) tablet 40 mg  40 mg Oral Daily McBane, Caroline N, PA-C   40 mg at 11/17/23 9051   gabapentin  (NEURONTIN ) capsule 300 mg  300 mg Oral BID McBane, Caroline N, PA-C   300 mg at 11/17/23 9051   guaiFENesin  (MUCINEX ) 12 hr tablet 1,200 mg  1,200 mg Oral BID McBane, Caroline N, PA-C   1,200 mg at 11/17/23 0946   heparin  injection 5,000 Units  5,000 Units Subcutaneous Q8H McBane, Caroline N, PA-C   5,000 Units at 11/17/23 1428   hydrALAZINE  (APRESOLINE ) tablet 25 mg  25 mg Oral Q6H PRN Gonfa, Taye T, MD    25 mg at 11/16/23 1520   hyoscyamine  (LEVSIN  SL) SL tablet 0.125 mg  0.125 mg Sublingual Q8H PRN McBane, Caroline N, PA-C       insulin  aspart (novoLOG ) injection 0-5 Units  0-5 Units Subcutaneous QHS McBane, Caroline N, PA-C       insulin  aspart (novoLOG ) injection 0-9 Units  0-9 Units Subcutaneous TID WC McBane, Caroline N, PA-C   3 Units at 11/15/23 1631   ipratropium-albuterol  (DUONEB) 0.5-2.5 (3) MG/3ML nebulizer solution 3 mL  3 mL Nebulization Q6H PRN McBane, Caroline N, PA-C       levothyroxine  (SYNTHROID ) tablet 50 mcg  50 mcg Oral Q0600 McBane, Caroline N, PA-C   50 mcg at 11/17/23 9386   lidocaine  (PF) (XYLOCAINE ) 1 % injection 8 mL  8 mL Intradermal Once McBane, Caroline N, PA-C       LORazepam  (ATIVAN ) tablet 1 mg  1 mg Oral Once PRN McBane, Caroline N, PA-C       melatonin tablet 5 mg  5 mg Oral QHS PRN Shona Laurence N, DO   5 mg at 11/17/23 0252   methocarbamol  (ROBAXIN ) tablet 500 mg  500 mg Oral Q6H PRN McBane, Caroline N, PA-C   500 mg at 11/16/23 2321   metoprolol  tartrate (LOPRESSOR ) tablet 50 mg  50 mg Oral BID Gonfa, Taye T, MD   50 mg at 11/17/23 0946   morphine  (MS CONTIN ) 12 hr tablet 15 mg  15 mg Oral Q12H McBane, Caroline N, PA-C   15 mg at 11/17/23 9051   ondansetron  (ZOFRAN ) injection 4 mg  4 mg Intravenous Q6H PRN McBane, Caroline N, PA-C       oxyCODONE  (Oxy IR/ROXICODONE ) immediate release tablet 5-10 mg  5-10 mg Oral Q4H PRN McBane, Caroline N, PA-C   10 mg at 11/17/23 1428   pantoprazole  (PROTONIX ) EC tablet 40 mg  40 mg Oral BID McBane, Caroline N, PA-C   40 mg at 11/17/23 9051   polyethylene glycol (MIRALAX  / GLYCOLAX ) packet 17 g  17 g Oral Daily PRN McBane, Caroline N, PA-C       predniSONE  (DELTASONE ) tablet 10 mg  10 mg Oral Q breakfast McBane, Caroline N, PA-C   10 mg at 11/17/23 9051   senna (SENOKOT) tablet 17.2 mg  17.2 mg Oral Daily PRN McBane, Caroline N, PA-C       sodium chloride  flush (NS) 0.9 % injection 3 mL  3 mL Intravenous Q12H McBane, Caroline  N, PA-C   3 mL at 11/16/23 2123   sodium chloride  flush (NS) 0.9 % injection 3 mL  3 mL Intravenous Q12H McBane, Caroline N, PA-C   3 mL at 11/17/23 0951   sodium chloride  flush (NS) 0.9 % injection 3  mL  3 mL Intravenous PRN McBane, Caroline N, PA-C         Discharge Medications: Please see discharge summary for a list of discharge medications.  Relevant Imaging Results:  Relevant Lab Results:   Additional Information [id number]  Lauraine FORBES Saa, LCSW

## 2023-11-17 NOTE — Plan of Care (Signed)
  Problem: Education: Goal: Ability to describe self-care measures that may prevent or decrease complications (Diabetes Survival Skills Education) will improve Outcome: Progressing Goal: Individualized Educational Video(s) Outcome: Progressing   Problem: Coping: Goal: Ability to adjust to condition or change in health will improve Outcome: Progressing   Problem: Fluid Volume: Goal: Ability to maintain a balanced intake and output will improve Outcome: Progressing   Problem: Health Behavior/Discharge Planning: Goal: Ability to identify and utilize available resources and services will improve Outcome: Progressing Goal: Ability to manage health-related needs will improve Outcome: Progressing   Problem: Metabolic: Goal: Ability to maintain appropriate glucose levels will improve Outcome: Progressing   Problem: Nutritional: Goal: Maintenance of adequate nutrition will improve Outcome: Progressing Goal: Progress toward achieving an optimal weight will improve Outcome: Progressing   Problem: Skin Integrity: Goal: Risk for impaired skin integrity will decrease Outcome: Progressing   Problem: Tissue Perfusion: Goal: Adequacy of tissue perfusion will improve Outcome: Progressing   Problem: Education: Goal: Knowledge of General Education information will improve Description: Including pain rating scale, medication(s)/side effects and non-pharmacologic comfort measures Outcome: Progressing   Problem: Health Behavior/Discharge Planning: Goal: Ability to manage health-related needs will improve Outcome: Progressing   Problem: Clinical Measurements: Goal: Ability to maintain clinical measurements within normal limits will improve Outcome: Progressing Goal: Will remain free from infection Outcome: Progressing Goal: Diagnostic test results will improve Outcome: Progressing Goal: Respiratory complications will improve Outcome: Progressing Goal: Cardiovascular complication will  be avoided Outcome: Progressing   Problem: Activity: Goal: Risk for activity intolerance will decrease Outcome: Progressing   Problem: Nutrition: Goal: Adequate nutrition will be maintained Outcome: Progressing   Problem: Coping: Goal: Level of anxiety will decrease Outcome: Progressing   Problem: Elimination: Goal: Will not experience complications related to bowel motility Outcome: Progressing Goal: Will not experience complications related to urinary retention Outcome: Progressing   Problem: Pain Managment: Goal: General experience of comfort will improve and/or be controlled Outcome: Progressing   Problem: Safety: Goal: Ability to remain free from injury will improve Outcome: Progressing   Problem: Skin Integrity: Goal: Risk for impaired skin integrity will decrease Outcome: Progressing   Problem: Education: Goal: Knowledge of disease or condition will improve Outcome: Progressing Goal: Knowledge of the prescribed therapeutic regimen will improve Outcome: Progressing Goal: Individualized Educational Video(s) Outcome: Progressing   Problem: Activity: Goal: Ability to tolerate increased activity will improve Outcome: Progressing Goal: Will verbalize the importance of balancing activity with adequate rest periods Outcome: Progressing   Problem: Respiratory: Goal: Ability to maintain a clear airway will improve Outcome: Progressing Goal: Levels of oxygenation will improve Outcome: Progressing Goal: Ability to maintain adequate ventilation will improve Outcome: Progressing

## 2023-11-17 NOTE — Progress Notes (Signed)
 Progress Note   Patient: [name] FMW:[id number] DOB: Jun 17, 1940 DOA: 11/13/2023     4 DOS: the patient was seen and examined on 11/17/2023   Brief hospital course: 83 year old F with PMH of GIB due to sigmoid stricture s/p colectomy and colostomy creation in 06/2023, PE not on anticoagulation due to GIB, nonobstructive CAD, chronic chest pain, COPD, chronic hypoxic RF on 3 L, HFpEF, IDDM-2, rheumatoid arthritis, stage II sacral pressure ulcer, morbid obesity, HTN, dyslipidemia, hypothyroidism and chronic sinus tachycardia brought to ED by EMS due to increased swelling and pain of left arm and left shoulder, as well as left-sided chest pain.   CT angio chest negative for PE but concerning for multifocal pneumonia, nondisplaced fracture of superior left glenoid fossa and septic arthritis in left shoulder.  CT angio LUE showed nondisplaced fracture of posterosuperior glenoid, large loculated peripherally enhancing fluid collection extending from left glenohumeral joint into proximal and mid biceps musculature measuring 5.3 x 3.6 x 7.5 cm concerning for septic arthritis and intramuscular abscess, questionable developing humeral head osteomyelitis.  Orthopedic surgery consulted and recommended IR drainage and Ortho evaluation the morning. Blood cultures obtained.  Started on IV fluid and  broad-spectrum antibiotics   Patient had diagnostic aspiration of LUE intramuscular abscess.  Blood and abscess culture with MRSA.  ID consulted.  She also underwent left shoulder hemiarthroplasty by Dr. Cristy on 6/29.  Assessment and Plan: Sepsis: Present on arrival.  Has tachycardia and tachypnea.  Source of infection as below. Left shoulder septic arthritis Left glenohumeral joint abscess formation Left upper extremity intramuscular abscess Nondisplaced fracture of posterior superior glenoid MRSA bacteremia-blood cultures as above. -Imaging as above.  CRP 39.  ESR 106.  Hemodynamically stable. -S/p IR  aspiration of LUE intramuscular abscess by IR on 6/28 -S/p left shoulder hemiarthroplasty by Dr. Cristy - Tylenol  650 mg q6 hr PRN  - IV daptomycin 700 mg daily (monitor CK)   Community-acquired pneumonia  - Mucinex  1200 mg PO bid  - Duoneb q6 hr PRN  - Antibx as above    Left-sided chest pain -Manage infection as above   Elevated troponin/history of nonobstructive CAD - Lipitor 10 mg PO daily    Chronic HFpEF - Lasix  40 mg PO daily    Chronic COPD/chronic hypoxic RF on 3 L at baseline - Duoneb q6 hr PRN  - Prednisone  10 mg PO daily    Essential hypertension - Lopressor  50 mg PO bid    Chronic pain syndrome/neuropathy - Cymbalta  60 mg PO daily  - MS Contin  15 mg PO q12  - Oxycodone  PRN - Gabapentin  300 mg PO bid    IDDM-2 with hyperglycemia and level 1 hypoglycemia: A1c 6.0% in 09/2023. - Novolog  SS ACHS   Rheumatoid arthritis - Prednisone  10 mg PO daily  -Hold Arava  and Plaquenil  in the setting of active infection   Normocytic anemia - Monitor H&H   History of PE - Not on anticoagulation due to GI bleed.  CTA chest negative for PE.   History of sigmoid colon stricture s/p colectomy and colostomy creation February 2025 -Continue colostomy care.   Hyperlipidemia - Lipitor as above    Chronic constipation -Continue MiraLAX  and senna code   GERD - Protonix  40 mg PO bid    Generalized anxiety disorder - Cymbalta  as above    Hypothyroidism - Synthroid  50 mcg PO daily    Class I obesity Body mass index is 34.14 kg/m.  Subjective: Pt seen and examined at the bedside. She  continues on IV daptomycin. Appreciate ID consult note. Monitor CK levels.  Physical Exam: Vitals:   11/17/23 0415 11/17/23 0723 11/17/23 0946 11/17/23 1145  BP: (!) 145/82 (!) 149/93 (!) 149/93 (!) 144/88  Pulse: 89 90 (!) 101 90  Resp: 18 (!) 29  16  Temp: 97.8 F (36.6 C) 98.5 F (36.9 C)  98.9 F (37.2 C)  TempSrc: Oral Oral  Oral  SpO2: 98% 98%  99%  Weight:      Height:        Physical Exam HENT:     Head: Normocephalic.     Mouth/Throat:     Mouth: Mucous membranes are moist.   Cardiovascular:     Rate and Rhythm: Normal rate.  Pulmonary:     Effort: Pulmonary effort is normal.  Abdominal:     Palpations: Abdomen is soft.   Musculoskeletal:     Comments: L arm sling    Skin:    General: Skin is warm.   Neurological:     Mental Status: She is alert. Mental status is at baseline.   Psychiatric:        Mood and Affect: Mood normal.      Disposition: Status is: Inpatient Remains inpatient appropriate because: IV daptomycin  Planned Discharge Destination: Barriers to discharge: IV antibx's    Time spent: 35 minutes  Author: Alexsus Papadopoulos , MD 11/17/2023 11:48 AM  For on call review [email or web address].

## 2023-11-17 NOTE — Progress Notes (Signed)
   ORTHOPAEDIC PROGRESS NOTE  s/p Procedure(s): HEMIARTHROPLASTY, SHOULDER  SUBJECTIVE: She had increasing pain last night but medications helped. She states during the day her pain is not as bad.     OBJECTIVE: PE: General: sitting up in hospital bed, NAD LUE: Dressing CDI and sling well fitting. Drain in place with minimal bloody drainage noted in the canister. Drain removed. 4x4s and tegaderm placed over drain site. Swelling of the left hand as to be expected. + Motor in  AIN, PIN, Ulnar distributions. Axillary nerve sensation preserved and symmetric per patient report. Difficult to appreciate deltoid motor function. Sensation intact in medial, radial, and ulnar distributions. Well perfused digits.    Vitals:   11/17/23 0415 11/17/23 0723  BP: (!) 145/82 (!) 149/93  Pulse: 89 90  Resp: 18 (!) 29  Temp: 97.8 F (36.6 C) 98.5 F (36.9 C)  SpO2: 98% 98%     ASSESSMENT: Reona F Melchior is a 83 y.o. female POD#2  PLAN: Weightbearing: NWB LUE Insicional and dressing care: Reinforce dressings as needed Hemovac removed today.  Orthopedic device(s): Sling.  Showering: Hold for now VTE prophylaxis: Heparin  while inpatient. Transition to eliquis  at discharge Pain control: PRN pain medications, minimize narcotics as able Follow - up plan: 2 weeks in office Dispo: TBD. Pending medical stabilization. Discharge medications for Eliquis  and Oxycodone  printed and placed in patient's chart.  Contact information:  Weekdays 8-5 Dr. Bonner Hair, Aleck Stalling PA-C, After hours and holidays please check [email or web address] for group call information for [facility]  Aleck Stalling, PA-C 11/17/23

## 2023-11-17 NOTE — Progress Notes (Signed)
 Occupational Therapy Treatment Patient Details Name: [name] MRN: [id number] DOB: 1941/01/01 Today's Date: 11/17/2023   History of present illness 83 y.o. female admitted 6/27 from home reporting increased swelling  and pain in L arm. Negative for DVT. CT showed nondisplaced fracture of the posterior superior glenoid, a large loculated fluid collection from the glenohumeral joint into the proximal and mid biceps. S/p L Hemiarthroplasty 6/29. PMH: DM, HTN, CAD, RA,  s/p  colectomy with colostomy creation, CHF, COPD   OT comments  Pt progressing with shoulder education and movement of wrist hand and elbow this session. Pt provided ice on shoulder and educated on icing for pain management. Sling adjusted during session and pillow placed for proper positioning but giving relief to patients neck. Pt states its like when your bra strap too tight. Pt demonstrates some scapula retraction for stretch as well. Recommendation for skilled inpatient follow up therapy, <3 hours/day.       If plan is discharge home, recommend the following:      Equipment Recommendations  Hospital bed;Wheelchair cushion (measurements OT);Wheelchair (measurements OT);BSC/3in1    Recommendations for Other Services PT consult    Precautions / Restrictions Precautions Precautions: Fall;Shoulder Type of Shoulder Precautions: hemiarthroplasty Shoulder Interventions: At all times Recall of Precautions/Restrictions: Impaired Precaution/Restrictions Comments: allowed hand wrist and elbow movement Required Braces or Orthoses: Sling Restrictions LUE Weight Bearing Per Provider Order: Non weight bearing       Mobility Bed Mobility                    Transfers                         Balance                                           ADL either performed or assessed with clinical judgement   ADL                                         General ADL  Comments: in chair on arrival and session focused on UE exercises    Extremity/Trunk Assessment Upper Extremity Assessment LUE Deficits / Details: educated on ice and provided on arm. educated on hand wrist elbow movement allowed with return Chartered loss adjuster Communication Communication: No apparent difficulties   Cognition Arousal: Alert Behavior During Therapy: WFL for tasks assessed/performed Cognition: Cognition impaired             OT - Cognition Comments: pt reports i havent been home in 5 months.                 Following commands: Intact        Cueing      Exercises Exercises: Shoulder Other Exercises Other Exercises: arom hand wrist elbow with MIN (A) 10 reps and thena gain supervision for return demo. Other Exercises: ice applied    Shoulder Instructions Shoulder Instructions Donning/doffing sling/immobilizer: Maximal assistance ROM for elbow, wrist and digits of operated UE: Supervision/safety (min (A) initially) Positioning of UE while sleeping: Minimal assistance  General Comments VSS on 2L    Pertinent Vitals/ Pain       Pain Assessment Pain Assessment: No/denies pain  Home Living                                          Prior Functioning/Environment              Frequency  Min 2X/week        Progress Toward Goals  OT Goals(current goals can now be found in the care plan section)  Progress towards OT goals: Progressing toward goals  Acute Rehab OT Goals Patient Stated Goal: to get better to go home to spouse OT Goal Formulation: Patient unable to participate in goal setting Time For Goal Achievement: 11/30/23 Potential to Achieve Goals: Good ADL Goals Pt/caregiver will Perform Home Exercise Program: Left upper extremity;With written HEP provided;With minimal assist Additional ADL Goal #1: pt will complete bed mobility min (A) as precursor  to adls. Additional ADL Goal #2: pt will complete bed to chair transfer min ()A  Plan      Co-evaluation                 AM-PAC OT 6 Clicks Daily Activity     Outcome Measure   Help from another person eating meals?: A Little Help from another person taking care of personal grooming?: A Little Help from another person toileting, which includes using toliet, bedpan, or urinal?: A Lot Help from another person bathing (including washing, rinsing, drying)?: A Lot Help from another person to put on and taking off regular upper body clothing?: A Lot Help from another person to put on and taking off regular lower body clothing?: Total 6 Click Score: 13    End of Session Equipment Utilized During Treatment: Oxygen   OT Visit Diagnosis: Unsteadiness on feet (R26.81);Muscle weakness (generalized) (M62.81)   Activity Tolerance Patient tolerated treatment well   Patient Left in chair;with call bell/phone within reach;with chair alarm set   Nurse Communication Mobility status;Precautions;Weight bearing status        Time: 1522-1540 OT Time Calculation (min): 18 min  Charges: OT General Charges $OT Visit: 1 Visit OT Treatments $Therapeutic Exercise: 8-22 mins   Brynn, OTR/L  Acute Rehabilitation Services Office: [phone] .   Ely Molt 11/17/2023, 3:43 PM

## 2023-11-17 NOTE — Plan of Care (Signed)
  Problem: Activity: Goal: Risk for activity intolerance will decrease Outcome: Progressing   Problem: Nutrition: Goal: Adequate nutrition will be maintained Outcome: Progressing   Problem: Coping: Goal: Level of anxiety will decrease Outcome: Progressing   Problem: Elimination: Goal: Will not experience complications related to bowel motility Outcome: Progressing Goal: Will not experience complications related to urinary retention Outcome: Progressing   Problem: Pain Managment: Goal: General experience of comfort will improve and/or be controlled Outcome: Progressing   Problem: Safety: Goal: Ability to remain free from injury will improve Outcome: Progressing

## 2023-11-17 NOTE — Progress Notes (Signed)
 Physical Therapy Treatment Patient Details Name: [name] MRN: [id number] DOB: June 04, 1940 Today's Date: 11/17/2023   History of Present Illness 83 y.o. female admitted 6/27 from home reporting increased swelling  and pain in L arm. Negative for DVT. CT showed nondisplaced fracture of the posterior superior glenoid, a large loculated fluid collection from the glenohumeral joint into the proximal and mid biceps. S/p L Hemiarthroplasty 6/29. PMH: DM, HTN, CAD, RA,  s/p  colectomy with colostomy creation, CHF, COPD    PT Comments  Pt making slow, steady progress. Able to get OOB to recliner with assist. Ostomy bag with leaking closure so pt with several times with extended standing to be cleaned up. Patient will benefit from continued inpatient follow up therapy, <3 hours/day.     If plan is discharge home, recommend the following: A lot of help with walking and/or transfers;A lot of help with bathing/dressing/bathroom   Can travel by private vehicle     No  Equipment Recommendations  None recommended by PT (pt equipped)    Recommendations for Other Services       Precautions / Restrictions Precautions Precautions: Fall;Shoulder Type of Shoulder Precautions: hemiarthroplasty Shoulder Interventions: At all times Recall of Precautions/Restrictions: Impaired Precaution/Restrictions Comments: allowed hand wrist and elbow movement Required Braces or Orthoses: Sling Restrictions Weight Bearing Restrictions Per Provider Order: Yes LUE Weight Bearing Per Provider Order: Non weight bearing     Mobility  Bed Mobility Overal bed mobility: Needs Assistance Bed Mobility: Supine to Sit     Supine to sit: Mod assist     General bed mobility comments: Assist to elevate trunk into sitting and bring hips to EOB.    Transfers Overall transfer level: Needs assistance Equipment used: 1 person hand held assist Transfers: Sit to/from Stand, Bed to chair/wheelchair/BSC Sit to Stand: Mod  assist, Min assist   Step pivot transfers: Min assist       General transfer comment: Face to face with pt using my elbow to pull up. Min assist from elevated bed and light mod assist from low recliner. Small pivotal steps bed to chair.    Ambulation/Gait                   Stairs             Wheelchair Mobility     Tilt Bed    Modified Rankin (Stroke Patients Only)       Balance Overall balance assessment: Needs assistance Sitting-balance support: No upper extremity supported, Feet supported Sitting balance-Leahy Scale: Fair     Standing balance support: During functional activity, Single extremity supported Standing balance-Leahy Scale: Poor Standing balance comment: UE support.                            Communication Communication Communication: No apparent difficulties  Cognition Arousal: Alert Behavior During Therapy: WFL for tasks assessed/performed   PT - Cognitive impairments: Memory                         Following commands: Intact      Cueing Cueing Techniques: Verbal cues, Gestural cues, Visual cues  Exercises      General Comments General comments (skin integrity, edema, etc.): VSS on 2L O2      Pertinent Vitals/Pain Pain Assessment Pain Assessment: Faces Faces Pain Scale: Hurts little more Pain Location: bil knees, rt shoulder Pain Descriptors / Indicators: Grimacing, Guarding,  Aching Pain Intervention(s): Limited activity within patient's tolerance, Monitored during session, Repositioned    Home Living                          Prior Function            PT Goals (current goals can now be found in the care plan section) Acute Rehab PT Goals Patient Stated Goal: to walk Progress towards PT goals: Progressing toward goals    Frequency    Min 2X/week      PT Plan      Co-evaluation              AM-PAC PT 6 Clicks Mobility   Outcome Measure  Help needed turning from  your back to your side while in a flat bed without using bedrails?: A Lot Help needed moving from lying on your back to sitting on the side of a flat bed without using bedrails?: A Lot Help needed moving to and from a bed to a chair (including a wheelchair)?: A Lot Help needed standing up from a chair using your arms (e.g., wheelchair or bedside chair)?: A Lot Help needed to walk in hospital room?: Total Help needed climbing 3-5 steps with a railing? : Total 6 Click Score: 10    End of Session Equipment Utilized During Treatment: Other (comment) (sling) Activity Tolerance: Patient tolerated treatment well Patient left: with call bell/phone within reach;in chair;with chair alarm set Nurse Communication: Mobility status PT Visit Diagnosis: Unsteadiness on feet (R26.81);Muscle weakness (generalized) (M62.81);Pain Pain - Right/Left: Left Pain - part of body: Shoulder     Time: 1258-1350 PT Time Calculation (min) (ACUTE ONLY): 52 min  Charges:    $Therapeutic Activity: 38-52 mins PT General Charges $$ ACUTE PT VISIT: 1 Visit                    [facility] PT Acute Rehabilitation Services Office [phone]    Rodgers ORN [facility] 11/17/2023, 2:27 PM

## 2023-11-17 NOTE — Plan of Care (Signed)
  Problem: Education: Goal: Ability to describe self-care measures that may prevent or decrease complications (Diabetes Survival Skills Education) will improve Outcome: Progressing Goal: Individualized Educational Video(s) Outcome: Progressing   Problem: Coping: Goal: Ability to adjust to condition or change in health will improve Outcome: Progressing   Problem: Fluid Volume: Goal: Ability to maintain a balanced intake and output will improve Outcome: Progressing   Problem: Health Behavior/Discharge Planning: Goal: Ability to identify and utilize available resources and services will improve Outcome: Progressing Goal: Ability to manage health-related needs will improve Outcome: Progressing   Problem: Metabolic: Goal: Ability to maintain appropriate glucose levels will improve Outcome: Progressing   Problem: Nutritional: Goal: Maintenance of adequate nutrition will improve Outcome: Progressing Goal: Progress toward achieving an optimal weight will improve Outcome: Progressing   Problem: Skin Integrity: Goal: Risk for impaired skin integrity will decrease Outcome: Progressing   Problem: Tissue Perfusion: Goal: Adequacy of tissue perfusion will improve Outcome: Progressing   Problem: Education: Goal: Knowledge of General Education information will improve Description: Including pain rating scale, medication(s)/side effects and non-pharmacologic comfort measures Outcome: Progressing   Problem: Health Behavior/Discharge Planning: Goal: Ability to manage health-related needs will improve Outcome: Progressing   Problem: Clinical Measurements: Goal: Ability to maintain clinical measurements within normal limits will improve Outcome: Progressing Goal: Will remain free from infection Outcome: Progressing Goal: Diagnostic test results will improve Outcome: Progressing Goal: Respiratory complications will improve Outcome: Progressing Goal: Cardiovascular complication will  be avoided Outcome: Progressing   Problem: Activity: Goal: Risk for activity intolerance will decrease Outcome: Progressing   Problem: Nutrition: Goal: Adequate nutrition will be maintained Outcome: Progressing   Problem: Coping: Goal: Level of anxiety will decrease Outcome: Progressing   Problem: Elimination: Goal: Will not experience complications related to bowel motility Outcome: Progressing Goal: Will not experience complications related to urinary retention Outcome: Progressing   Problem: Pain Managment: Goal: General experience of comfort will improve and/or be controlled Outcome: Progressing   Problem: Safety: Goal: Ability to remain free from injury will improve Outcome: Progressing   Problem: Respiratory: Goal: Ability to maintain a clear airway will improve Outcome: Progressing Goal: Levels of oxygenation will improve Outcome: Progressing Goal: Ability to maintain adequate ventilation will improve Outcome: Progressing   Problem: Activity: Goal: Ability to tolerate increased activity will improve Outcome: Progressing Goal: Will verbalize the importance of balancing activity with adequate rest periods Outcome: Progressing

## 2023-11-18 ENCOUNTER — Telehealth (HOSPITAL_COMMUNITY): Payer: Self-pay | Admitting: Pharmacy Technician

## 2023-11-18 ENCOUNTER — Other Ambulatory Visit: Payer: Self-pay

## 2023-11-18 ENCOUNTER — Other Ambulatory Visit (HOSPITAL_COMMUNITY): Payer: Self-pay

## 2023-11-18 DIAGNOSIS — B9562 Methicillin resistant Staphylococcus aureus infection as the cause of diseases classified elsewhere: Secondary | ICD-10-CM | POA: Diagnosis not present

## 2023-11-18 DIAGNOSIS — J849 Interstitial pulmonary disease, unspecified: Secondary | ICD-10-CM

## 2023-11-18 DIAGNOSIS — Z5181 Encounter for therapeutic drug level monitoring: Secondary | ICD-10-CM

## 2023-11-18 DIAGNOSIS — R7881 Bacteremia: Secondary | ICD-10-CM | POA: Diagnosis not present

## 2023-11-18 DIAGNOSIS — M00012 Staphylococcal arthritis, left shoulder: Secondary | ICD-10-CM | POA: Diagnosis not present

## 2023-11-18 DIAGNOSIS — J9611 Chronic respiratory failure with hypoxia: Secondary | ICD-10-CM | POA: Diagnosis not present

## 2023-11-18 LAB — COMPREHENSIVE METABOLIC PANEL WITH GFR
ALT: 15 U/L (ref 0–44)
AST: 17 U/L (ref 15–41)
Albumin: 1.9 g/dL — ABNORMAL LOW (ref 3.5–5.0)
Alkaline Phosphatase: 126 U/L (ref 38–126)
Anion gap: 9 (ref 5–15)
BUN: 12 mg/dL (ref 8–23)
CO2: 28 mmol/L (ref 22–32)
Calcium: 7.6 mg/dL — ABNORMAL LOW (ref 8.9–10.3)
Chloride: 102 mmol/L (ref 98–111)
Creatinine, Ser: 0.75 mg/dL (ref 0.44–1.00)
GFR, Estimated: >60 mL/min (ref >60)
Glucose, Bld: 84 mg/dL (ref 70–99)
Potassium: 3.3 mmol/L — ABNORMAL LOW (ref 3.5–5.1)
Sodium: 139 mmol/L (ref 135–145)
Total Bilirubin: 0.8 mg/dL (ref 0.0–1.2)
Total Protein: 4.9 g/dL — ABNORMAL LOW (ref 6.5–8.1)

## 2023-11-18 LAB — CBC
HCT: 27.8 % — ABNORMAL LOW (ref 36.0–46.0)
Hemoglobin: 8.6 g/dL — ABNORMAL LOW (ref 12.0–15.0)
MCH: 29.5 pg (ref 26.0–34.0)
MCHC: 30.9 g/dL (ref 30.0–36.0)
MCV: 95.2 fL (ref 80.0–100.0)
Platelets: 225 10*3/uL (ref 150–400)
RBC: 2.92 MIL/uL — ABNORMAL LOW (ref 3.87–5.11)
RDW: 15.4 % (ref 11.5–15.5)
WBC: 7.5 10*3/uL (ref 4.0–10.5)
nRBC: 0 % (ref 0.0–0.2)

## 2023-11-18 LAB — GLUCOSE, CAPILLARY
Glucose-Capillary: 86 mg/dL (ref 70–99)
Glucose-Capillary: 202 mg/dL — ABNORMAL HIGH (ref 70–99)
Glucose-Capillary: 119 mg/dL — ABNORMAL HIGH (ref 70–99)
Glucose-Capillary: 125 mg/dL — ABNORMAL HIGH (ref 70–99)

## 2023-11-18 LAB — MAGNESIUM: Magnesium: 1.9 mg/dL (ref 1.7–2.4)

## 2023-11-18 LAB — C-REACTIVE PROTEIN: CRP: 12.6 mg/dL — ABNORMAL HIGH (ref <1.0)

## 2023-11-18 LAB — PHOSPHORUS: Phosphorus: 2.4 mg/dL — ABNORMAL LOW (ref 2.5–4.6)

## 2023-11-18 NOTE — Plan of Care (Signed)
  Problem: Education: Goal: Ability to describe self-care measures that may prevent or decrease complications (Diabetes Survival Skills Education) will improve Outcome: Progressing Goal: Individualized Educational Video(s) Outcome: Progressing   Problem: Coping: Goal: Ability to adjust to condition or change in health will improve Outcome: Progressing   Problem: Fluid Volume: Goal: Ability to maintain a balanced intake and output will improve Outcome: Progressing   Problem: Health Behavior/Discharge Planning: Goal: Ability to identify and utilize available resources and services will improve Outcome: Progressing Goal: Ability to manage health-related needs will improve Outcome: Progressing   Problem: Metabolic: Goal: Ability to maintain appropriate glucose levels will improve Outcome: Progressing   Problem: Nutritional: Goal: Maintenance of adequate nutrition will improve Outcome: Progressing Goal: Progress toward achieving an optimal weight will improve Outcome: Progressing   Problem: Skin Integrity: Goal: Risk for impaired skin integrity will decrease Outcome: Progressing   Problem: Tissue Perfusion: Goal: Adequacy of tissue perfusion will improve Outcome: Progressing   Problem: Education: Goal: Knowledge of General Education information will improve Description: Including pain rating scale, medication(s)/side effects and non-pharmacologic comfort measures Outcome: Progressing   Problem: Health Behavior/Discharge Planning: Goal: Ability to manage health-related needs will improve Outcome: Progressing   Problem: Clinical Measurements: Goal: Ability to maintain clinical measurements within normal limits will improve Outcome: Progressing Goal: Will remain free from infection Outcome: Progressing Goal: Diagnostic test results will improve Outcome: Progressing Goal: Respiratory complications will improve Outcome: Progressing Goal: Cardiovascular complication will  be avoided Outcome: Progressing   Problem: Activity: Goal: Risk for activity intolerance will decrease Outcome: Progressing   Problem: Nutrition: Goal: Adequate nutrition will be maintained Outcome: Progressing   Problem: Coping: Goal: Level of anxiety will decrease Outcome: Progressing   Problem: Elimination: Goal: Will not experience complications related to bowel motility Outcome: Progressing Goal: Will not experience complications related to urinary retention Outcome: Progressing   Problem: Pain Managment: Goal: General experience of comfort will improve and/or be controlled Outcome: Progressing   Problem: Safety: Goal: Ability to remain free from injury will improve Outcome: Progressing   Problem: Skin Integrity: Goal: Risk for impaired skin integrity will decrease Outcome: Progressing   Problem: Education: Goal: Knowledge of disease or condition will improve Outcome: Progressing Goal: Knowledge of the prescribed therapeutic regimen will improve Outcome: Progressing Goal: Individualized Educational Video(s) Outcome: Progressing   Problem: Activity: Goal: Ability to tolerate increased activity will improve Outcome: Progressing Goal: Will verbalize the importance of balancing activity with adequate rest periods Outcome: Progressing   Problem: Respiratory: Goal: Ability to maintain a clear airway will improve Outcome: Progressing Goal: Levels of oxygenation will improve Outcome: Progressing Goal: Ability to maintain adequate ventilation will improve Outcome: Progressing

## 2023-11-18 NOTE — Progress Notes (Signed)
 PHARMACY CONSULT NOTE FOR:  OUTPATIENT  PARENTERAL ANTIBIOTIC THERAPY (OPAT)  Indication: MRSA Bacteremia/septic arthritis of shoulder Regimen: Daptomycin 700 mg every 24 hours End date: 12/27/23  IV antibiotic discharge orders are pended. To discharging provider:  please sign these orders via discharge navigator,  Select New Orders & click on the button choice - Manage This Unsigned Work.     Thank you for allowing pharmacy to be a part of this patient's care.  Damien Quiet, PharmD, BCPS, BCIDP Infectious Diseases Clinical Pharmacist Phone: [phone] 11/18/2023, 10:26 AM

## 2023-11-18 NOTE — TOC Progression Note (Signed)
 Transition of Care [facility]) - Progression Note    Patient Details  Name: [name] MRN: [id number] Date of Birth: 07/20/1940  [facility] CM/SW Contact  Lauraine FORBES Saa, LCSW Phone Number: 11/18/2023, 1:57 PM  Clinical Narrative:     1:57 PM CSW provided current SNF options ([facility], [address], [address], [place], [place], [place], [facility], [facility], [facility], [facility]) with their quarterly Medicare ratings.  Expected Discharge Plan: Skilled Nursing Facility Barriers to Discharge: Continued Medical Work up, SNF Pending bed offer  Expected Discharge Plan and Services In-house Referral: Clinical Social Work Discharge Planning Services: CM Consult Post Acute Care Choice: Skilled Nursing Facility Living arrangements for the past 2 months: Single Family Home                                       Social Determinants of Health (SDOH) Interventions SDOH Screenings   Food Insecurity: No Food Insecurity (11/14/2023)  Housing: Low Risk  (11/14/2023)  Transportation Needs: No Transportation Needs (11/14/2023)  Utilities: Not At Risk (11/14/2023)  Alcohol  Screen: Low Risk  (10/30/2023)  Depression (PHQ2-9): Low Risk  (11/09/2023)  Recent Concern: Depression (PHQ2-9) - Medium Risk (10/15/2023)  Financial Resource Strain: Low Risk  (10/30/2023)  Physical Activity: Insufficiently Active (10/30/2023)  Social Connections: Socially Integrated (11/14/2023)  Stress: No Stress Concern Present (10/30/2023)  Tobacco Use: Low Risk  (11/15/2023)  Health Literacy: Adequate Health Literacy (10/30/2023)    Readmission Risk Interventions    07/17/2023    3:28 PM 07/08/2023   10:09 AM 12/16/2022   11:09 AM  Readmission Risk Prevention Plan  Transportation Screening Complete Complete Complete  PCP or Specialist Appt within 5-7 Days   Complete  Home Care Screening   Complete  Medication Review (RN CM)   Complete  Medication Review (RN  Care Manager) Complete Referral to Pharmacy   PCP or Specialist appointment within 3-5 days of discharge Complete Complete   HRI or Home Care Consult Complete Complete   SW Recovery Care/Counseling Consult Complete    Palliative Care Screening Not Applicable Not Applicable   Skilled Nursing Facility Complete Complete

## 2023-11-18 NOTE — Plan of Care (Signed)
  Problem: Fluid Volume: Goal: Ability to maintain a balanced intake and output will improve Outcome: Progressing   Problem: Nutritional: Goal: Maintenance of adequate nutrition will improve Outcome: Progressing   Problem: Skin Integrity: Goal: Risk for impaired skin integrity will decrease Outcome: Progressing   Problem: Education: Goal: Knowledge of General Education information will improve Description: Including pain rating scale, medication(s)/side effects and non-pharmacologic comfort measures Outcome: Progressing   Problem: Clinical Measurements: Goal: Will remain free from infection Outcome: Progressing

## 2023-11-18 NOTE — Telephone Encounter (Addendum)
 Patient Product/process development scientist completed.    The patient is insured through [employer].     Ran test claim for Sivextro 200 mg and the current 30 day co-pay is $2,427.01.  Ran test claim for Nuzyra 150 mg and the current 30 day co-pay is $2,828.91.  This test claim was processed through [facility]- copay amounts may vary at other pharmacies due to pharmacy/plan contracts, or as the patient moves through the different stages of their insurance plan.     Reyes Sharps, CPHT Pharmacy Technician III Certified Patient Advocate [facility] Patient Advocate Team Direct Number: [phone]  Fax: [phone]

## 2023-11-18 NOTE — Progress Notes (Signed)
 [facility]  Date of Admission:  11/13/2023     Reason for Follow Up: Septic arthritis [facility])  Total days of antibiotics 6         ASSESSMENT:  [name] is an 83 year old female presenting with left arm swelling and pain and found to have left shoulder septic arthritis with abscess complicated by MRSA bacteremia status post left shoulder hemiarthroplasty with antibiotic cement and open synovectomy.  Postop day #3 with blood cultures from 11/15/2023 remaining without growth to date.  Surgical specimens concordant with bacteremia and showing MRSA.  Daptomycin sensitivities remain pending.  Will place PICC line.  Discussed plan of care for 6 weeks of IV antibiotics with daptomycin.  Will likely need oral antibiotics given hardware placement.  Counseled on signs/symptoms of eosinophilic pneumonia and when to contact provider.  Home health/OPAT orders.  Postoperative wound care per orthopedics.  Okay for discharge once PICC and home health arranged.  Follow-up in ID clinic.  Remaining medical and supportive care per internal medicine.  PLAN:  Continue current dose of daptomycin through 12/27/2023. Place PICC line. Monitor cultures until finalized. Home health/OPAT orders. Postoperative wound care per orthopedics. Contact precautions for MRSA. Follow-up in ID clinic and okay for discharge once home health and PICC line arranged. Remaining medical and supportive care per internal medicine.  Diagnosis:  MRSA left shoulder septic arthritis and MRSA bacteremia   Allergies  Allergen Reactions   Codeine Swelling and Other (See Comments)    Facial and leg swelling Chest pain    Remicade [Infliximab] Anaphylaxis    sent me into shock    Zestril [Lisinopril] Swelling and Rash    Face and neck swelling    Ofev  [Nintedanib] Diarrhea    OPAT Orders Discharge antibiotics to be given via PICC line Discharge antibiotics: Daptomycin 700 mg IV q 24  Per  pharmacy protocol Aim for Vancomycin  trough 15-20 or AUC 400-550 (unless otherwise indicated) Duration: 6 weeks  End Date: 12/27/23  [facility] Care Per Protocol:  Home health RN for IV administration and teaching; PICC line care and labs.    Labs weekly while on IV antibiotics: _X_ CBC with differential _X_ BMP __ CMP _X_ CRP _X_ ESR __ Vancomycin  trough _X_ CK  _X_ Please pull PIC at completion of IV antibiotics __ Please leave PIC in place until doctor has seen patient or been notified  Fax weekly labs to [phone]  Clinic Follow Up Appt:  12/09/23 at 10am with Dr. Fleeta Mcdaniel   Principal Problem:   Septic arthritis [facility]) Active Problems:   Rheumatoid arthritis (HCC)   Debility   Chronic hypoxic respiratory failure (HCC)   Depression   Insulin  dependent type 2 diabetes mellitus (HCC)   Chronic chest pain   Chronic pain syndrome   Diabetic neuropathy (HCC)   Sinus tachycardia   History of pulmonary embolus (PE)   Hypothyroidism   Hyperlipidemia   Chronic diastolic CHF (congestive heart failure) (HCC)   GAD (generalized anxiety disorder)   SIRS (systemic inflammatory response syndrome) (HCC)   CAP (community acquired pneumonia)   Elevated troponin   History of GI bleed   History of CAD (coronary artery disease)   Stage IV pressure ulcer of sacral region (HCC)   GERD (gastroesophageal reflux disease)   Chronic constipation   MRSA bacteremia   Therapeutic drug monitoring    atorvastatin   10 mg Oral Daily   cholecalciferol   1,000 Units Oral Daily   DULoxetine   60  mg Oral Daily   furosemide   40 mg Oral Daily   gabapentin   300 mg Oral BID   guaiFENesin   1,200 mg Oral BID   heparin   5,000 Units Subcutaneous Q8H   insulin  aspart  0-5 Units Subcutaneous QHS   insulin  aspart  0-9 Units Subcutaneous TID WC   levothyroxine   50 mcg Oral Q0600   lidocaine  (PF)  8 mL Intradermal Once   metoprolol  tartrate  50 mg Oral BID   morphine   15 mg Oral Q12H   pantoprazole    40 mg Oral BID   predniSONE   10 mg Oral Q breakfast   sodium chloride  flush  3 mL Intravenous Q12H   sodium chloride  flush  3 mL Intravenous Q12H    SUBJECTIVE:  Afebrile overnight with no acute events.  No new concerns/complaints.  Son visiting at bedside.  Tolerating antibiotics with no adverse side effects.  Allergies  Allergen Reactions   Codeine Swelling and Other (See Comments)    Facial and leg swelling Chest pain    Remicade [Infliximab] Anaphylaxis    sent me into shock    Zestril [Lisinopril] Swelling and Rash    Face and neck swelling    Ofev  [Nintedanib] Diarrhea     Review of Systems: Review of Systems  Constitutional:  Negative for chills, fever and weight loss.  Respiratory:  Negative for cough, shortness of breath and wheezing.   Cardiovascular:  Negative for chest pain and leg swelling.  Gastrointestinal:  Negative for abdominal pain, constipation, diarrhea, nausea and vomiting.  Skin:  Negative for rash.      OBJECTIVE: Vitals:   11/18/23 0342 11/18/23 0736 11/18/23 0817 11/18/23 1110  BP: 132/79 129/60 129/60 116/77  Pulse: 83 95 (!) 102 88  Resp: 10 14  (!) 24  Temp: 97.7 F (36.5 C) 98.8 F (37.1 C)  98.5 F (36.9 C)  TempSrc: Oral Oral  Oral  SpO2: 99% 98%    Weight:      Height:       Body mass index is 34.14 kg/m.  Physical Exam Constitutional:      General: She is not in acute distress.    Appearance: She is well-developed.  Cardiovascular:     Rate and Rhythm: Normal rate and regular rhythm.     Heart sounds: Normal heart sounds.  Pulmonary:     Effort: Pulmonary effort is normal.     Breath sounds: Normal breath sounds.  Skin:    General: Skin is warm and dry.  Neurological:     Mental Status: She is alert and oriented to person, place, and time.  Psychiatric:        Mood and Affect: Mood normal.     Lab Results Lab Results  Component Value Date   WBC 7.5 11/18/2023   HGB 8.6 (L) 11/18/2023   HCT 27.8 (L)  11/18/2023   MCV 95.2 11/18/2023   PLT 225 11/18/2023    Lab Results  Component Value Date   CREATININE 0.75 11/18/2023   BUN 12 11/18/2023   NA 139 11/18/2023   K 3.3 (L) 11/18/2023   CL 102 11/18/2023   CO2 28 11/18/2023    Lab Results  Component Value Date   ALT 15 11/18/2023   AST 17 11/18/2023   ALKPHOS 126 11/18/2023   BILITOT 0.8 11/18/2023     Microbiology: Recent Results (from the past 240 hours)  Body fluid culture w Gram Stain     Status: None   Collection  Time: 11/13/23  9:22 PM   Specimen: Abscess; Body Fluid  Result Value Ref Range Status   Specimen Description ABSCESS  Final   Special Requests JOINT,LEFT SHOULDER  Final   Gram Stain   Final    MODERATE WBC PRESENT,BOTH PMN AND MONONUCLEAR RARE GRAM POSITIVE COCCI Performed at [facility], [address] [place], [place] [address]    Culture   Final    MODERATE METHICILLIN RESISTANT STAPHYLOCOCCUS AUREUS   Report Status 11/16/2023 FINAL  Final   Organism ID, Bacteria METHICILLIN RESISTANT STAPHYLOCOCCUS AUREUS  Final      Susceptibility   Methicillin resistant staphylococcus aureus - MIC*    CIPROFLOXACIN >=8 RESISTANT Resistant     ERYTHROMYCIN >=8 RESISTANT Resistant     GENTAMICIN <=0.5 SENSITIVE Sensitive     OXACILLIN >=4 RESISTANT Resistant     TETRACYCLINE >=16 RESISTANT Resistant     VANCOMYCIN  1 SENSITIVE Sensitive     TRIMETH /SULFA  <=10 SENSITIVE Sensitive     CLINDAMYCIN >=8 RESISTANT Resistant     RIFAMPIN <=0.5 SENSITIVE Sensitive     Inducible Clindamycin NEGATIVE Sensitive     LINEZOLID 2 SENSITIVE Sensitive     * MODERATE METHICILLIN RESISTANT STAPHYLOCOCCUS AUREUS  Anaerobic culture w Gram Stain     Status: None (Preliminary result)   Collection Time: 11/13/23  9:22 PM   Specimen: Abscess  Result Value Ref Range Status   Specimen Description ABSCESS  Final   Special Requests JOINT,LEFT SHOULDER  Final   Gram Stain   Final    MODERATE WBC PRESENT,BOTH PMN AND  MONONUCLEAR RARE GRAM POSITIVE COCCI Performed at [facility], [address] [place], [place] [address]    Culture PENDING  Incomplete   Report Status PENDING  Incomplete  Culture, blood (Routine X 2) w Reflex to ID Panel     Status: Abnormal (Preliminary result)   Collection Time: 11/13/23 11:31 PM   Specimen: BLOOD RIGHT HAND  Result Value Ref Range Status   Specimen Description BLOOD RIGHT HAND  Final   Special Requests   Final    BOTTLES DRAWN AEROBIC ONLY Blood Culture results may not be optimal due to an inadequate volume of blood received in culture bottles   Culture  Setup Time   Final    GRAM POSITIVE COCCI AEROBIC BOTTLE ONLY CRITICAL RESULT CALLED TO, READ BACK BY AND VERIFIED WITH: PHARMD S WISE 11/14/2023 @ 2132 BY AB    Culture (A)  Final    METHICILLIN RESISTANT STAPHYLOCOCCUS AUREUS Sent to [facility] for further susceptibility testing. Performed at [facility], [address], [place], [place] [address]    Report Status PENDING  Incomplete   Organism ID, Bacteria METHICILLIN RESISTANT STAPHYLOCOCCUS AUREUS  Final      Susceptibility   Methicillin resistant staphylococcus aureus - MIC*    CIPROFLOXACIN >=8 RESISTANT Resistant     ERYTHROMYCIN >=8 RESISTANT Resistant     GENTAMICIN <=0.5 SENSITIVE Sensitive     OXACILLIN >=4 RESISTANT Resistant     TETRACYCLINE >=16 RESISTANT Resistant     VANCOMYCIN  1 SENSITIVE Sensitive     TRIMETH /SULFA  <=10 SENSITIVE Sensitive     CLINDAMYCIN >=8 RESISTANT Resistant     RIFAMPIN <=0.5 SENSITIVE Sensitive     Inducible Clindamycin NEGATIVE Sensitive     LINEZOLID 2 SENSITIVE Sensitive     * METHICILLIN RESISTANT STAPHYLOCOCCUS AUREUS  Blood Culture ID Panel (Reflexed)     Status: Abnormal   Collection Time: 11/13/23  11:31 PM  Result Value Ref Range Status   Enterococcus faecalis NOT DETECTED NOT DETECTED Final   Enterococcus Faecium NOT DETECTED NOT DETECTED Final   Listeria monocytogenes NOT DETECTED NOT  DETECTED Final   Staphylococcus species DETECTED (A) NOT DETECTED Final    Comment: CRITICAL RESULT CALLED TO, READ BACK BY AND VERIFIED WITH: PHARMD S WISE 11/14/2023 @ 2132 BY AB    Staphylococcus aureus (BCID) DETECTED (A) NOT DETECTED Final    Comment: Methicillin (oxacillin)-resistant Staphylococcus aureus (MRSA). MRSA is predictably resistant to beta-lactam antibiotics (except ceftaroline). Preferred therapy is vancomycin  unless clinically contraindicated. Patient requires contact precautions if  hospitalized. CRITICAL RESULT CALLED TO, READ BACK BY AND VERIFIED WITH: PHARMD S WISE 11/14/2023 @ 2132 BY AB    Staphylococcus epidermidis NOT DETECTED NOT DETECTED Final   Staphylococcus lugdunensis NOT DETECTED NOT DETECTED Final   Streptococcus species NOT DETECTED NOT DETECTED Final   Streptococcus agalactiae NOT DETECTED NOT DETECTED Final   Streptococcus pneumoniae NOT DETECTED NOT DETECTED Final   Streptococcus pyogenes NOT DETECTED NOT DETECTED Final   A.calcoaceticus-baumannii NOT DETECTED NOT DETECTED Final   Bacteroides fragilis NOT DETECTED NOT DETECTED Final   Enterobacterales NOT DETECTED NOT DETECTED Final   Enterobacter cloacae complex NOT DETECTED NOT DETECTED Final   Escherichia coli NOT DETECTED NOT DETECTED Final   Klebsiella aerogenes NOT DETECTED NOT DETECTED Final   Klebsiella oxytoca NOT DETECTED NOT DETECTED Final   Klebsiella pneumoniae NOT DETECTED NOT DETECTED Final   Proteus species NOT DETECTED NOT DETECTED Final   Salmonella species NOT DETECTED NOT DETECTED Final   Serratia marcescens NOT DETECTED NOT DETECTED Final   Haemophilus influenzae NOT DETECTED NOT DETECTED Final   Neisseria meningitidis NOT DETECTED NOT DETECTED Final   Pseudomonas aeruginosa NOT DETECTED NOT DETECTED Final   Stenotrophomonas maltophilia NOT DETECTED NOT DETECTED Final   Candida albicans NOT DETECTED NOT DETECTED Final   Candida auris NOT DETECTED NOT DETECTED Final   Candida  glabrata NOT DETECTED NOT DETECTED Final   Candida krusei NOT DETECTED NOT DETECTED Final   Candida parapsilosis NOT DETECTED NOT DETECTED Final   Candida tropicalis NOT DETECTED NOT DETECTED Final   Cryptococcus neoformans/gattii NOT DETECTED NOT DETECTED Final   Meth resistant mecA/C and MREJ DETECTED (A) NOT DETECTED Final    Comment: CRITICAL RESULT CALLED TO, READ BACK BY AND VERIFIED WITH: PHARMD S WISE 11/14/2023 @ 2132 BY AB Performed at [facility], [address], [place], [place] [address]   Culture, blood (Routine X 2) w Reflex to ID Panel     Status: Abnormal   Collection Time: 11/13/23 11:48 PM   Specimen: BLOOD RIGHT FOREARM  Result Value Ref Range Status   Specimen Description BLOOD RIGHT FOREARM  Final   Special Requests   Final    BOTTLES DRAWN AEROBIC ONLY Blood Culture results may not be optimal due to an inadequate volume of blood received in culture bottles   Culture  Setup Time   Final    GRAM POSITIVE COCCI IN CLUSTERS AEROBIC BOTTLE ONLY CRITICAL VALUE NOTED.  VALUE IS CONSISTENT WITH PREVIOUSLY REPORTED AND CALLED VALUE.    Culture (A)  Final    STAPHYLOCOCCUS AUREUS SUSCEPTIBILITIES PERFORMED ON PREVIOUS CULTURE WITHIN THE LAST 5 DAYS. Performed at [facility], [address] [place], [place] [address]    Report Status 11/16/2023 FINAL  Final  Respiratory (~20 pathogens) panel by PCR     Status: None   Collection Time:  11/14/23  5:12 AM   Specimen: Nasopharyngeal Swab; Respiratory  Result Value Ref Range Status   Adenovirus NOT DETECTED NOT DETECTED Final   Coronavirus 229E NOT DETECTED NOT DETECTED Final    Comment: (NOTE) The Coronavirus on the Respiratory Panel, DOES NOT test for the novel  Coronavirus (2019 nCoV)    Coronavirus HKU1 NOT DETECTED NOT DETECTED Final   Coronavirus NL63 NOT DETECTED NOT DETECTED Final   Coronavirus OC43 NOT DETECTED NOT DETECTED Final   Metapneumovirus NOT DETECTED NOT DETECTED Final   Rhinovirus /  Enterovirus NOT DETECTED NOT DETECTED Final   Influenza A NOT DETECTED NOT DETECTED Final   Influenza B NOT DETECTED NOT DETECTED Final   Parainfluenza Virus 1 NOT DETECTED NOT DETECTED Final   Parainfluenza Virus 2 NOT DETECTED NOT DETECTED Final   Parainfluenza Virus 3 NOT DETECTED NOT DETECTED Final   Parainfluenza Virus 4 NOT DETECTED NOT DETECTED Final   Respiratory Syncytial Virus NOT DETECTED NOT DETECTED Final   Bordetella pertussis NOT DETECTED NOT DETECTED Final   Bordetella Parapertussis NOT DETECTED NOT DETECTED Final   Chlamydophila pneumoniae NOT DETECTED NOT DETECTED Final   Mycoplasma pneumoniae NOT DETECTED NOT DETECTED Final    Comment: Performed at [facility], [address], [place], [place] [address]  Aerobic/Anaerobic Culture w Gram Stain (surgical/deep wound)     Status: None (Preliminary result)   Collection Time: 11/14/23 11:05 AM   Specimen: Abscess  Result Value Ref Range Status   Specimen Description ABSCESS  Final   Special Requests ABSCESS  Final   Gram Stain   Final    ABUNDANT WBC PRESENT,BOTH PMN AND MONONUCLEAR RARE GRAM POSITIVE COCCI Performed at [name] Lab, [address] [place], [place] [address]    Culture   Final    MODERATE METHICILLIN RESISTANT STAPHYLOCOCCUS AUREUS   Report Status PENDING  Incomplete   Organism ID, Bacteria METHICILLIN RESISTANT STAPHYLOCOCCUS AUREUS  Final      Susceptibility   Methicillin resistant staphylococcus aureus - MIC*    CIPROFLOXACIN >=8 RESISTANT Resistant     ERYTHROMYCIN >=8 RESISTANT Resistant     GENTAMICIN <=0.5 SENSITIVE Sensitive     OXACILLIN >=4 RESISTANT Resistant     TETRACYCLINE >=16 RESISTANT Resistant     VANCOMYCIN  1 SENSITIVE Sensitive     TRIMETH /SULFA  <=10 SENSITIVE Sensitive     CLINDAMYCIN >=8 RESISTANT Resistant     RIFAMPIN <=0.5 SENSITIVE Sensitive     Inducible Clindamycin NEGATIVE Sensitive     LINEZOLID 2 SENSITIVE Sensitive     * MODERATE METHICILLIN RESISTANT  STAPHYLOCOCCUS AUREUS  MRSA Next Gen by PCR, Nasal     Status: Abnormal   Collection Time: 11/15/23  6:33 AM   Specimen: Nasal Mucosa; Nasal Swab  Result Value Ref Range Status   MRSA by PCR Next Gen DETECTED (A) NOT DETECTED Final    Comment: RESULT CALLED TO, READ BACK BY AND VERIFIED WITH: MERILEE HUA, RN  [phone] [id number] BY JINNY COMMON, [place] (NOTE) The GeneXpert MRSA Assay (FDA approved for NASAL specimens only), is one component of a comprehensive MRSA colonization surveillance program. It is not intended to diagnose MRSA infection nor to guide or monitor treatment for MRSA infections. Test performance is not FDA approved in patients less than 82 years old. Performed at [facility], [address], [place], [place] [address]   Aerobic/Anaerobic Culture w Gram Stain (surgical/deep wound)     Status: None (Preliminary result)   Collection  Time: 11/15/23  9:27 AM   Specimen: Path Tissue  Result Value Ref Range Status   Specimen Description TISSUE  Final   Special Requests NONE  Final   Gram Stain   Final    NO WBC SEEN NO ORGANISMS SEEN Performed at [facility], [address], [place], [place] [address]    Culture   Final    FEW METHICILLIN RESISTANT STAPHYLOCOCCUS AUREUS NO ANAEROBES ISOLATED; CULTURE IN PROGRESS FOR 5 DAYS    Report Status PENDING  Incomplete   Organism ID, Bacteria METHICILLIN RESISTANT STAPHYLOCOCCUS AUREUS  Final      Susceptibility   Methicillin resistant staphylococcus aureus - MIC*    CIPROFLOXACIN >=8 RESISTANT Resistant     ERYTHROMYCIN >=8 RESISTANT Resistant     GENTAMICIN <=0.5 SENSITIVE Sensitive     OXACILLIN >=4 RESISTANT Resistant     TETRACYCLINE >=16 RESISTANT Resistant     VANCOMYCIN  1 SENSITIVE Sensitive     TRIMETH /SULFA  <=10 SENSITIVE Sensitive     CLINDAMYCIN >=8 RESISTANT Resistant     RIFAMPIN <=0.5 SENSITIVE Sensitive     Inducible Clindamycin NEGATIVE Sensitive     LINEZOLID 2 SENSITIVE Sensitive     * FEW  METHICILLIN RESISTANT STAPHYLOCOCCUS AUREUS  Aerobic/Anaerobic Culture w Gram Stain (surgical/deep wound)     Status: None (Preliminary result)   Collection Time: 11/15/23  9:28 AM   Specimen: Path Tissue  Result Value Ref Range Status   Specimen Description TISSUE  Final   Special Requests NONE  Final   Gram Stain   Final    NO WBC SEEN NO ORGANISMS SEEN Performed at [facility] Lab, [address], [place], [place] [address]    Culture FEW METHICILLIN RESISTANT STAPHYLOCOCCUS AUREUS  Final   Report Status PENDING  Incomplete   Organism ID, Bacteria METHICILLIN RESISTANT STAPHYLOCOCCUS AUREUS  Final      Susceptibility   Methicillin resistant staphylococcus aureus - MIC*    CIPROFLOXACIN >=8 RESISTANT Resistant     ERYTHROMYCIN >=8 RESISTANT Resistant     GENTAMICIN <=0.5 SENSITIVE Sensitive     OXACILLIN >=4 RESISTANT Resistant     TETRACYCLINE >=16 RESISTANT Resistant     VANCOMYCIN  1 SENSITIVE Sensitive     TRIMETH /SULFA  <=10 SENSITIVE Sensitive     CLINDAMYCIN >=8 RESISTANT Resistant     RIFAMPIN <=0.5 SENSITIVE Sensitive     Inducible Clindamycin NEGATIVE Sensitive     LINEZOLID 2 SENSITIVE Sensitive     * FEW METHICILLIN RESISTANT STAPHYLOCOCCUS AUREUS  Culture, blood (Routine X 2) w Reflex to ID Panel     Status: None (Preliminary result)   Collection Time: 11/15/23  1:58 PM   Specimen: BLOOD RIGHT HAND  Result Value Ref Range Status   Specimen Description BLOOD RIGHT HAND  Final   Special Requests   Final    BOTTLES DRAWN AEROBIC AND ANAEROBIC Blood Culture adequate volume   Culture   Final    NO GROWTH 3 DAYS Performed at [facility], [address], [place], [place] [address]    Report Status PENDING  Incomplete  Culture, blood (Routine X 2) w Reflex to ID Panel     Status: None (Preliminary result)   Collection Time: 11/15/23  2:54 PM   Specimen: BLOOD RIGHT ARM  Result Value Ref Range Status   Specimen Description BLOOD RIGHT ARM  Final   Special  Requests   Final    BOTTLES DRAWN AEROBIC AND ANAEROBIC Blood Culture results  may not be optimal due to an inadequate volume of blood received in culture bottles   Culture   Final    NO GROWTH 3 DAYS Performed at [facility], [address], [place], [place] [address]    Report Status PENDING  Incomplete  MIC (1 Drug)-blood culure; 11/13/2023; BLOOD; MRSA; Daptomycin     Status: Abnormal   Collection Time: 11/16/23  9:10 AM   Specimen: BLOOD  Result Value Ref Range Status   Min Inhibitory Conc (1 Drug) Preliminary report (A)  Final    Comment: (NOTE) Performed At: [facility] [address] [place], [place] [id number] Jennette Shorter MD Ey:[id number]    Source DAPTOMYCIN MRSA  Final    Comment: Performed at [facility] Lab, [address], [place], [place] [address]  MIC Result     Status: Abnormal   Collection Time: 11/16/23  9:10 AM  Result Value Ref Range Status   Result 1 (MIC) Comment (A)  Final    Comment: (NOTE) Methicillin - resistant Staphylococcus aureus Identification performed by account, not confirmed by this laboratory. DAPTOMYCIN Performed At: [facility] [address] [place], [place] [id number] Jennette Shorter MD Ey:[id number]    I have personally spent 30 minutes involved in face-to-face and non-face-to-face activities for this patient on the day of the visit. Professional time spent includes the following activities: preparing to see the patient (review of tests),  performing a medically appropriate examination, ordering medications, communicating with other health care professionals, documenting clinical information in the EMR, communicating results and counseling patient and family regarding medication and plan of care, and care coordination.    Greg Corliss Coggeshall, NP [facility]  11/18/2023  11:28 AM

## 2023-11-18 NOTE — Progress Notes (Signed)
 Progress Note   Patient: [name] FMW:[id number] DOB: 1940-09-28 DOA: 11/13/2023     5 DOS: the patient was seen and examined on 11/18/2023   Brief hospital course: 83 year old F with PMH of GIB due to sigmoid stricture s/p colectomy and colostomy creation in 06/2023, PE not on anticoagulation due to GIB, nonobstructive CAD, chronic chest pain, COPD, chronic hypoxic RF on 3 L, HFpEF, IDDM-2, rheumatoid arthritis, stage II sacral pressure ulcer, morbid obesity, HTN, dyslipidemia, hypothyroidism and chronic sinus tachycardia brought to ED by EMS due to increased swelling and pain of left arm and left shoulder, as well as left-sided chest pain.   CT angio chest negative for PE but concerning for multifocal pneumonia, nondisplaced fracture of superior left glenoid fossa and septic arthritis in left shoulder.  CT angio LUE showed nondisplaced fracture of posterosuperior glenoid, large loculated peripherally enhancing fluid collection extending from left glenohumeral joint into proximal and mid biceps musculature measuring 5.3 x 3.6 x 7.5 cm concerning for septic arthritis and intramuscular abscess, questionable developing humeral head osteomyelitis.  Orthopedic surgery consulted and recommended IR drainage and Ortho evaluation the morning. Blood cultures obtained.  Started on IV fluid and  broad-spectrum antibiotics   Patient had diagnostic aspiration of LUE intramuscular abscess.  Blood and abscess culture with MRSA.  ID consulted.  She also underwent left shoulder hemiarthroplasty by Dr. Cristy on 6/29.  Assessment and Plan: Left shoulder septic arthritis Left glenohumeral joint abscess formation Left upper extremity intramuscular abscess Nondisplaced fracture of posterior superior glenoid MRSA bacteremia-blood cultures as above. CRP 39.  ESR 106.  Hemodynamically stable. S/p IR aspiration of LUE intramuscular abscess by IR on 6/28 S/p left shoulder hemiarthroplasty by Dr. Cristy Continue  pain control, Tylenol  650 mg q6 hr PRN  ID recommendations appreciated IV daptomycin 700 mg daily with end date 12/27/23. PICC line ordered. SNF placement pending.   Community-acquired pneumonia  Continue Mucinex  1200 mg PO bid, Duoneb q6 hr PRN  Pulmonary toilet.   Elevated troponin/history of nonobstructive CAD Likely demand ischemia. Continue Lipitor 10 mg PO daily    Chronic HFpEF Continue Lasix  40 mg PO daily, metoprolol .   Chronic COPD/chronic hypoxic RF on 3 L at baseline Duoneb q6 hr PRN  Prednisone  10 mg PO daily    Essential hypertension Lopressor  50 mg PO bid    Chronic pain syndrome/neuropathy Continue Cymbalta  60 mg PO daily, MS Contin  15 mg PO q12, Oxycodone  PRN and Gabapentin  300 mg PO bid    IDDM-2 with hyperglycemia and level 1 hypoglycemia: A1c 6.0% in 09/2023. Accucheks, Novolog  SS ACHS    Rheumatoid arthritis On Prednisone  10 mg PO daily  Hold Arava  and Plaquenil  in the setting of active infection   Normocytic anemia Hb stable. Monitor H&H   History of PE Not on anticoagulation due to GI bleed.  CTA chest negative for PE.   History of sigmoid colon stricture s/p colectomy and colostomy creation February 2025 Continue colostomy care.   Hyperlipidemia Lipitor as above    Chronic constipation -Continue MiraLAX  and senna code   GERD Protonix  40 mg PO bid    Generalized anxiety disorder Cymbalta  as above    Hypothyroidism Synthroid  50 mcg PO daily    Class I obesity Body mass index is 34.14 kg/m. Diet, exercise and weight reduction advised.       Out of bed to chair. Incentive spirometry. Nursing supportive care. Fall, aspiration precautions. Diet:  Diet Orders (From admission, onward)     Start  Ordered   11/15/23 1055  Diet Carb Modified Fluid consistency: Thin; Room service appropriate? Yes  Diet effective now       Question Answer Comment  Diet-HS Snack? Nothing   Calorie Level Medium 1600-2000   Fluid consistency: Thin    Room service appropriate? Yes      11/15/23 1054           DVT prophylaxis: heparin  injection 5,000 Units Start: 11/13/23 2200 SCDs Start: 11/13/23 2123 Place TED hose Start: 11/13/23 2123  Level of care: Telemetry Cardiac   Code Status: Full Code  Subjective: Patient is seen and examined today morning. She is comfortable, denies any complaints. Son at bedside.   Physical Exam: Vitals:   11/18/23 0342 11/18/23 0736 11/18/23 0817 11/18/23 1110  BP: 132/79 129/60 129/60 116/77  Pulse: 83 95 (!) 102 88  Resp: 10 14  (!) 24  Temp: 97.7 F (36.5 C) 98.8 F (37.1 C)  98.5 F (36.9 C)  TempSrc: Oral Oral  Oral  SpO2: 99% 98%    Weight:      Height:        General - Elderly obese [place] female, no apparent distress HEENT - PERRLA, EOMI, atraumatic head, non tender sinuses. Lung - Clear, basal rales, rhonchi, wheezes. Heart - S1, S2 heard, no murmurs, rubs, trace pedal edema. Abdomen - Soft, non tender, bowel sounds good Neuro - Alert, awake and oriented x 3, non focal exam. Skin - Warm and dry. Left shoulder dressing, sling noted.  Data Reviewed:      Latest Ref Rng & Units 11/18/2023    4:51 AM 11/17/2023    7:51 AM 11/16/2023    2:57 PM  CBC  WBC 4.0 - 10.5 K/uL 7.5  7.9  8.7   Hemoglobin 12.0 - 15.0 g/dL 8.6  8.4  8.9   Hematocrit 36.0 - 46.0 % 27.8  27.2  28.6   Platelets 150 - 400 K/uL 225  194  180       Latest Ref Rng & Units 11/18/2023    4:51 AM 11/17/2023    7:51 AM 11/16/2023    2:26 AM  BMP  Glucose 70 - 99 mg/dL 84  89  892   BUN 8 - 23 mg/dL 12  13  13    Creatinine 0.44 - 1.00 mg/dL 9.24  9.13  9.04   Sodium 135 - 145 mmol/L 139  139  139   Potassium 3.5 - 5.1 mmol/L 3.3  3.4  4.5   Chloride 98 - 111 mmol/L 102  103  103   CO2 22 - 32 mmol/L 28  26  27    Calcium  8.9 - 10.3 mg/dL 7.6  7.8  7.6    US  EKG SITE RITE Result Date: 11/18/2023 If Site Rite image not attached, placement could not be confirmed due to current cardiac  rhythm.  ECHOCARDIOGRAM COMPLETE Result Date: 11/16/2023    ECHOCARDIOGRAM REPORT   Patient Name:   LORAINE BHULLAR Date of Exam: 11/16/2023 Medical Rec #:  [id number]         Height:       64.0 in Accession #:    [id number]        Weight:       199.0 lb Date of Birth:  1940-07-15         BSA:          1.952 m Patient Age:    63 years  BP:           178/115 mmHg Patient Gender: F                 HR:           85 bpm. Exam Location:  Inpatient Procedure: 2D Echo, Cardiac Doppler and Color Doppler (Both Spectral and Color            Flow Doppler were utilized during procedure). Indications:    Bacteremia  History:        Patient has prior history of Echocardiogram examinations, most                 recent 08/14/2023.  Sonographer:    Jayson Gaskins Referring Phys: [id number] TAYE T GONFA IMPRESSIONS  1. Left ventricular ejection fraction, by estimation, is 60 to 65%. The left ventricle has normal function. The left ventricle has no regional wall motion abnormalities. Left ventricular diastolic parameters are consistent with Grade I diastolic dysfunction (impaired relaxation).  2. Right ventricular systolic function is normal. The right ventricular size is normal.  3. The mitral valve is normal in structure. No evidence of mitral valve regurgitation. No evidence of mitral stenosis.  4. The aortic valve is normal in structure. Aortic valve regurgitation is not visualized. No aortic stenosis is present.  5. The inferior vena cava is normal in size with greater than 50% respiratory variability, suggesting right atrial pressure of 3 mmHg. FINDINGS  Left Ventricle: Left ventricular ejection fraction, by estimation, is 60 to 65%. The left ventricle has normal function. The left ventricle has no regional wall motion abnormalities. The left ventricular internal cavity size was normal in size. There is  no left ventricular hypertrophy. Left ventricular diastolic parameters are consistent with Grade I diastolic dysfunction  (impaired relaxation). Right Ventricle: The right ventricular size is normal. No increase in right ventricular wall thickness. Right ventricular systolic function is normal. Left Atrium: Left atrial size was normal in size. Right Atrium: Right atrial size was normal in size. Pericardium: There is no evidence of pericardial effusion. Mitral Valve: The mitral valve is normal in structure. No evidence of mitral valve regurgitation. No evidence of mitral valve stenosis. Tricuspid Valve: The tricuspid valve is grossly normal. Tricuspid valve regurgitation is not demonstrated. No evidence of tricuspid stenosis. Aortic Valve: The aortic valve is normal in structure. Aortic valve regurgitation is not visualized. No aortic stenosis is present. Aortic valve mean gradient measures 3.0 mmHg. Aortic valve peak gradient measures 4.0 mmHg. Aortic valve area, by VTI measures 2.29 cm. Pulmonic Valve: The pulmonic valve was not well visualized. Pulmonic valve regurgitation is not visualized. No evidence of pulmonic stenosis. Aorta: The aortic root is normal in size and structure. Venous: The inferior vena cava is normal in size with greater than 50% respiratory variability, suggesting right atrial pressure of 3 mmHg. IAS/Shunts: No atrial level shunt detected by color flow Doppler.  LEFT VENTRICLE PLAX 2D LVIDd:         4.10 cm   Diastology LVIDs:         2.30 cm   LV e' medial:    4.90 cm/s LV PW:         0.80 cm   LV E/e' medial:  12.1 LV IVS:        0.90 cm   LV e' lateral:   6.09 cm/s LVOT diam:     1.80 cm   LV E/e' lateral: 9.7 LV SV:  46 LV SV Index:   23 LVOT Area:     2.54 cm  RIGHT VENTRICLE RV S prime:     18.50 cm/s TAPSE (M-mode): 2.6 cm LEFT ATRIUM             Index        RIGHT ATRIUM           Index LA Vol (A2C):   40.0 ml 20.49 ml/m  RA Area:     12.70 cm LA Vol (A4C):   31.8 ml 16.29 ml/m  RA Volume:   30.70 ml  15.73 ml/m LA Biplane Vol: 35.9 ml 18.39 ml/m  AORTIC VALVE AV Area (Vmax):    2.25 cm AV  Area (Vmean):   2.19 cm AV Area (VTI):     2.29 cm AV Vmax:           100.00 cm/s AV Vmean:          76.800 cm/s AV VTI:            0.200 m AV Peak Grad:      4.0 mmHg AV Mean Grad:      3.0 mmHg LVOT Vmax:         88.60 cm/s LVOT Vmean:        66.100 cm/s LVOT VTI:          0.180 m LVOT/AV VTI ratio: 0.90  AORTA Ao Root diam: 3.00 cm MITRAL VALVE MV Area (PHT): 3.99 cm    SHUNTS MV Decel Time: 190 msec    Systemic VTI:  0.18 m MV E velocity: 59.20 cm/s  Systemic Diam: 1.80 cm MV A velocity: 97.50 cm/s MV E/A ratio:  0.61 Aditya Sabharwal Electronically signed by Ria Commander Signature Date/Time: 11/16/2023/5:03:59 PM    Final     Family Communication: Discussed with patient, she understand and agree. All questions answered.  Disposition: Status is: Inpatient Remains inpatient appropriate because: IV antibiotics, PICC Line  Planned Discharge Destination: Skilled nursing facility     Time spent: 42 minutes  Author: Concepcion Riser, MD 11/18/2023 1:43 PM Secure chat 7am to 7pm For on call review [email or web address].

## 2023-11-19 ENCOUNTER — Inpatient Hospital Stay: Admitting: Primary Care

## 2023-11-19 DIAGNOSIS — M00012 Staphylococcal arthritis, left shoulder: Secondary | ICD-10-CM | POA: Diagnosis not present

## 2023-11-19 DIAGNOSIS — K5909 Other constipation: Secondary | ICD-10-CM | POA: Diagnosis not present

## 2023-11-19 DIAGNOSIS — E119 Type 2 diabetes mellitus without complications: Secondary | ICD-10-CM | POA: Diagnosis not present

## 2023-11-19 DIAGNOSIS — I5032 Chronic diastolic (congestive) heart failure: Secondary | ICD-10-CM | POA: Diagnosis not present

## 2023-11-19 LAB — GLUCOSE, CAPILLARY
Glucose-Capillary: 73 mg/dL (ref 70–99)
Glucose-Capillary: 168 mg/dL — ABNORMAL HIGH (ref 70–99)
Glucose-Capillary: 156 mg/dL — ABNORMAL HIGH (ref 70–99)
Glucose-Capillary: 105 mg/dL — ABNORMAL HIGH (ref 70–99)

## 2023-11-19 LAB — ANAEROBIC CULTURE W GRAM STAIN

## 2023-11-19 LAB — MIC RESULT

## 2023-11-19 LAB — MINIMUM INHIBITORY CONC. (1 DRUG)

## 2023-11-19 LAB — AEROBIC/ANAEROBIC CULTURE W GRAM STAIN (SURGICAL/DEEP WOUND)

## 2023-11-19 MED ORDER — DICLOFENAC SODIUM 1 % EX GEL
2.0000 g | Freq: Three times a day (TID) | CUTANEOUS | Status: DC
  Administered 2023-11-19 – 2023-11-22 (×10): 2 g via TOPICAL
  Filled 2023-11-19 (×2): qty 100

## 2023-11-19 MED ORDER — SODIUM CHLORIDE 0.9% FLUSH: 10.0000 mL | INTRAVENOUS | Status: DC | PRN

## 2023-11-19 MED ORDER — CHLORHEXIDINE GLUCONATE CLOTH 2 % EX PADS
6.0000 | MEDICATED_PAD | Freq: Every day | CUTANEOUS | Status: DC
  Administered 2023-11-19 – 2023-11-22 (×4): 6 via TOPICAL

## 2023-11-19 MED ORDER — SODIUM CHLORIDE 0.9% FLUSH
10.0000 mL | Freq: Two times a day (BID) | INTRAVENOUS | Status: DC
  Administered 2023-11-19 – 2023-11-21 (×5): 10 mL

## 2023-11-19 NOTE — Progress Notes (Signed)
 Progress Note   Patient: [name] FMW:[id number] DOB: [birth year] DOA: 11/13/2023     6 DOS: the patient was seen and examined on 11/19/2023   Brief hospital course: 83 year old F with PMH of GIB due to sigmoid stricture s/p colectomy and colostomy creation in 06/2023, PE not on anticoagulation due to GIB, nonobstructive CAD, chronic chest pain, COPD, chronic hypoxic RF on 3 L, HFpEF, IDDM-2, rheumatoid arthritis, stage II sacral pressure ulcer, morbid obesity, HTN, dyslipidemia, hypothyroidism and chronic sinus tachycardia brought to ED by EMS due to increased swelling and pain of left arm and left shoulder, as well as left-sided chest pain.   CT angio chest negative for PE but concerning for multifocal pneumonia, nondisplaced fracture of superior left glenoid fossa and septic arthritis in left shoulder.  CT angio LUE showed nondisplaced fracture of posterosuperior glenoid, large loculated peripherally enhancing fluid collection extending from left glenohumeral joint into proximal and mid biceps musculature measuring 5.3 x 3.6 x 7.5 cm concerning for septic arthritis and intramuscular abscess, questionable developing humeral head osteomyelitis.  Orthopedic surgery consulted and recommended IR drainage and Ortho evaluation the morning. Blood cultures obtained.  Started on IV fluid and  broad-spectrum antibiotics   Patient had diagnostic aspiration of LUE intramuscular abscess.  Blood and abscess culture with MRSA.  ID consulted.  She also underwent left shoulder hemiarthroplasty by Dr. Cristy on 6/29. ID advised Daptomycin 700mg  daily until 12/27/23. PICC line placed 11/19/23.  Assessment and Plan: Left shoulder septic arthritis Left glenohumeral joint abscess formation Left upper extremity intramuscular abscess Nondisplaced fracture of posterior superior glenoid MRSA bacteremia-blood cultures as above. CRP 39.  ESR 106.  Hemodynamically stable. S/p IR aspiration of LUE intramuscular abscess by  IR on 6/28 S/p left shoulder hemiarthroplasty by Dr. Cristy Continue pain control, Tylenol  650 mg q6 hr PRN  ID recommendations appreciated IV daptomycin 700 mg daily with end date 12/27/23. PICC line placed 11/19/23. SNF placement pending.   Community-acquired pneumonia  Continue Mucinex  1200 mg PO bid, Duoneb q6 hr PRN  Pulmonary toilet.   Elevated troponin/history of nonobstructive CAD Likely demand ischemia. Continue Lipitor 10 mg PO daily    Chronic HFpEF Continue Lasix  40 mg PO daily, metoprolol .   Chronic COPD/chronic hypoxic RF on 3 L at baseline Duoneb q6 hr PRN  Prednisone  10 mg PO daily    Essential hypertension Lopressor  50 mg PO bid    Chronic pain syndrome/neuropathy Continue Cymbalta  60 mg PO daily, MS Contin  15 mg PO q12, Oxycodone  PRN and Gabapentin  300 mg PO bid    IDDM-2 with hyperglycemia and level 1 hypoglycemia: A1c 6.0% in 09/2023. Accucheks, Novolog  SS ACHS    Rheumatoid arthritis On Prednisone  10 mg PO daily  Hold Arava  and Plaquenil  in the setting of active infection   Normocytic anemia Hb stable. Monitor H&H   History of PE Not on anticoagulation due to GI bleed.  CTA chest negative for PE.   History of sigmoid colon stricture s/p colectomy and colostomy creation February 2025 Continue colostomy care.   Hyperlipidemia Lipitor as above    Chronic constipation Continue MiraLAX  and senna, colace.   GERD Protonix  40 mg PO bid    Generalized anxiety disorder Cymbalta  as above    Hypothyroidism Synthroid  50 mcg PO daily    Class I obesity Body mass index is 34.14 kg/m. Diet, exercise and weight reduction advised.       Out of bed to chair. Incentive spirometry. Nursing supportive care. Fall, aspiration precautions. Diet:  Diet Orders (From admission, onward)     Start     Ordered   11/15/23 1055  Diet Carb Modified Fluid consistency: Thin; Room service appropriate? Yes  Diet effective now       Question Answer Comment   Diet-HS Snack? Nothing   Calorie Level Medium 1600-2000   Fluid consistency: Thin   Room service appropriate? Yes      11/15/23 1054           DVT prophylaxis: heparin  injection 5,000 Units Start: 11/13/23 2200 SCDs Start: 11/13/23 2123 Place TED hose Start: 11/13/23 2123  Level of care: Telemetry Cardiac   Code Status: Full Code  Subjective: Patient is seen and examined today morning. She has left shoulder pain, asked RN to get her pain meds, also want her colostomy bag changed. Denies any complaints. Daughter at bedside.  Physical Exam: Vitals:   11/19/23 0310 11/19/23 0755 11/19/23 0827 11/19/23 1120  BP: 114/70 137/76 137/76 (!) 158/85  Pulse:   89   Resp: 13 17  14   Temp: 98.3 F (36.8 C) 98.4 F (36.9 C)  97.9 F (36.6 C)  TempSrc: Oral Oral  Oral  SpO2: 98% 95%  95%  Weight:      Height:        General - Elderly obese [place] female, distress due to pain HEENT - PERRLA, EOMI, atraumatic head, non tender sinuses. Lung - Clear, basal rales, rhonchi, wheezes. Heart - S1, S2 heard, no murmurs, rubs, trace pedal edema. Abdomen - Soft, non tender, bowel sounds good Neuro - Alert, awake and oriented x 3, non focal exam. Skin - Warm and dry. Left shoulder dressing, sling noted.  Data Reviewed:      Latest Ref Rng & Units 11/18/2023    4:51 AM 11/17/2023    7:51 AM 11/16/2023    2:57 PM  CBC  WBC 4.0 - 10.5 K/uL 7.5  7.9  8.7   Hemoglobin 12.0 - 15.0 g/dL 8.6  8.4  8.9   Hematocrit 36.0 - 46.0 % 27.8  27.2  28.6   Platelets 150 - 400 K/uL 225  194  180       Latest Ref Rng & Units 11/18/2023    4:51 AM 11/17/2023    7:51 AM 11/16/2023    2:26 AM  BMP  Glucose 70 - 99 mg/dL 84  89  892   BUN 8 - 23 mg/dL 12  13  13    Creatinine 0.44 - 1.00 mg/dL 9.24  9.13  9.04   Sodium 135 - 145 mmol/L 139  139  139   Potassium 3.5 - 5.1 mmol/L 3.3  3.4  4.5   Chloride 98 - 111 mmol/L 102  103  103   CO2 22 - 32 mmol/L 28  26  27    Calcium  8.9 - 10.3 mg/dL 7.6   7.8  7.6    US  EKG SITE RITE Result Date: 11/18/2023 If Site Rite image not attached, placement could not be confirmed due to current cardiac rhythm.   Family Communication: Discussed with patient, daughter at bedside. They understand and agree. All questions answered.  Disposition: Status is: Inpatient Remains inpatient appropriate because: long term IV antibiotics, PICC Line placed for SNF placement.  Planned Discharge Destination: Skilled nursing facility     Time spent: 40 minutes  Author: Concepcion Riser, MD 11/19/2023 3:29 PM Secure chat 7am to 7pm For on call review [email or web address].

## 2023-11-19 NOTE — Progress Notes (Signed)
 Peripherally Inserted Central Catheter Placement  The IV Nurse has discussed with the patient and/or persons authorized to consent for the patient, the purpose of this procedure and the potential benefits and risks involved with this procedure.  The benefits include less needle sticks, lab draws from the catheter, and the patient may be discharged home with the catheter. Risks include, but not limited to, infection, bleeding, blood clot (thrombus formation), and puncture of an artery; nerve damage and irregular heartbeat and possibility to perform a PICC exchange if needed/ordered by physician.  Alternatives to this procedure were also discussed.  Bard Power PICC patient education guide, fact sheet on infection prevention and patient information card has been provided to patient /or left at bedside.    PICC Placement Documentation  PICC Single Lumen 11/19/23 Right Brachial 36 cm 1 cm (Active)  Indication for Insertion or Continuance of Line Home intravenous therapies (PICC only) 11/19/23 0900  Exposed Catheter (cm) 1 cm 11/19/23 0900  Site Assessment Clean, Dry, Intact 11/19/23 0900  Line Status Flushed;Saline locked;Blood return noted 11/19/23 0900  Dressing Type Transparent;Securing device 11/19/23 0900  Dressing Status Antimicrobial disc/dressing in place;Clean, Dry, Intact 11/19/23 0900  Line Care Connections checked and tightened 11/19/23 0900  Line Adjustment (NICU/IV Team Only) No 11/19/23 0900  Dressing Intervention New dressing;Adhesive placed at insertion site (IV team only) 11/19/23 0900  Dressing Change Due 11/26/23 11/19/23 0900       Ethyl Priestly Renee 11/19/2023, 9:21 AM

## 2023-11-19 NOTE — Progress Notes (Signed)
 Physical Therapy Treatment Patient Details Name: [name] MRN: [id number] DOB: 1940/08/26 Today's Date: 11/19/2023   History of Present Illness 83 y.o. female admitted 6/27 from home reporting increased swelling  and pain in L arm. Negative for DVT. CT showed nondisplaced fracture of the posterior superior glenoid, a large loculated fluid collection from the glenohumeral joint into the proximal and mid biceps. S/p L Hemiarthroplasty 6/29. PMH: DM, HTN, CAD, RA,  s/p  colectomy with colostomy creation, CHF, COPD    PT Comments  Tolerated treatment well, focusing on bed mobility, transfer training, and LE exercises. Sling in place but adjusted for support. Up to mod assist for bed mobility and min assist to step pivot transfer to recliner. Reviewed LE exercises. VSS. Patient will continue to benefit from skilled physical therapy services to further improve independence with functional mobility.     If plan is discharge home, recommend the following: A lot of help with walking and/or transfers;A lot of help with bathing/dressing/bathroom;Assistance with cooking/housework;Assist for transportation;Help with stairs or ramp for entrance   Can travel by private vehicle     No  Equipment Recommendations  None recommended by PT (pt equipped)    Recommendations for Other Services       Precautions / Restrictions Precautions Precautions: Fall;Shoulder Type of Shoulder Precautions: hemiarthroplasty Shoulder Interventions: At all times Recall of Precautions/Restrictions: Impaired Precaution/Restrictions Comments: allowed hand wrist and elbow movement Required Braces or Orthoses: Sling Restrictions Weight Bearing Restrictions Per Provider Order: Yes LUE Weight Bearing Per Provider Order: Non weight bearing     Mobility  Bed Mobility Overal bed mobility: Needs Assistance Bed Mobility: Supine to Sit     Supine to sit: Mod assist     General bed mobility comments: Mod assist for  trunk support with VC for technique and moving LEs off bed. Needs some assist to scoot forward to EOB.    Transfers Overall transfer level: Needs assistance Equipment used: 1 person hand held assist Transfers: Sit to/from Stand, Bed to chair/wheelchair/BSC Sit to Stand: Min assist   Step pivot transfers: Min assist       General transfer comment: Face to face with pt using my elbow to pull up with RUE. LUE in sling. Min assist to boost, LEs weak and sits down shortly after standing. Practiced x3 with each duration tolerating a bit longer with cues for upright posture. Provided slight Rt knee support through therapist knee to block but progressed with step pivot transfer towards Rt with min asist for balance, and multimodal cues to sequence.    Ambulation/Gait               General Gait Details: may attempt next visit with +2 assist and hemiwalker.   Stairs             Wheelchair Mobility     Tilt Bed    Modified Rankin (Stroke Patients Only)       Balance Overall balance assessment: Needs assistance Sitting-balance support: No upper extremity supported, Feet supported Sitting balance-Leahy Scale: Fair     Standing balance support: During functional activity, Single extremity supported Standing balance-Leahy Scale: Poor Standing balance comment: UE support.                            Communication Communication Communication: No apparent difficulties  Cognition Arousal: Alert Behavior During Therapy: WFL for tasks assessed/performed   PT - Cognitive impairments: Memory  Following commands: Intact      Cueing Cueing Techniques: Verbal cues, Gestural cues  Exercises General Exercises - Lower Extremity Ankle Circles/Pumps: AROM, Both, 10 reps, Seated Gluteal Sets: Strengthening, Both, 10 reps, Seated Long Arc Quad: Strengthening, Both, 5 reps, Seated Other Exercises Other Exercises: Encouraged finger  and wrist movement while LUE supported under pillow in chair.    General Comments General comments (skin integrity, edema, etc.): VSS on 3L throughout.      Pertinent Vitals/Pain Pain Assessment Pain Assessment: Faces Faces Pain Scale: Hurts little more Pain Location: Lt shoulder Pain Descriptors / Indicators: Grimacing, Guarding, Aching Pain Intervention(s): Limited activity within patient's tolerance, Monitored during session, Repositioned    Home Living                          Prior Function            PT Goals (current goals can now be found in the care plan section) Acute Rehab PT Goals Patient Stated Goal: to walk PT Goal Formulation: With patient Time For Goal Achievement: 11/30/23 Potential to Achieve Goals: Fair Progress towards PT goals: Progressing toward goals    Frequency    Min 2X/week      PT Plan      Co-evaluation              AM-PAC PT 6 Clicks Mobility   Outcome Measure  Help needed turning from your back to your side while in a flat bed without using bedrails?: A Lot Help needed moving from lying on your back to sitting on the side of a flat bed without using bedrails?: A Lot Help needed moving to and from a bed to a chair (including a wheelchair)?: A Lot Help needed standing up from a chair using your arms (e.g., wheelchair or bedside chair)?: A Lot Help needed to walk in hospital room?: Total Help needed climbing 3-5 steps with a railing? : Total 6 Click Score: 10    End of Session Equipment Utilized During Treatment: Gait belt;Oxygen  Activity Tolerance: Patient tolerated treatment well Patient left: with call bell/phone within reach;in chair;with chair alarm set;with family/visitor present Nurse Communication: Mobility status (colostomy needs burped) PT Visit Diagnosis: Unsteadiness on feet (R26.81);Muscle weakness (generalized) (M62.81);Pain;Other abnormalities of gait and mobility (R26.89);Difficulty in walking, not  elsewhere classified (R26.2) Pain - Right/Left: Left Pain - part of body: Shoulder     Time: 8890-8862 PT Time Calculation (min) (ACUTE ONLY): 28 min  Charges:    $Therapeutic Activity: 23-37 mins PT General Charges $$ ACUTE PT VISIT: 1 Visit                     [name], PT, DPT [facility]  Rehabilitation Services Physical Therapist Office: [phone] Website: [email or web address]    Joan GORMAN Mcdaniel 11/19/2023, 12:54 PM

## 2023-11-19 NOTE — TOC Progression Note (Signed)
 Transition of Care [facility]) - Progression Note    Patient Details  Name: [name] MRN: [id number] Date of Birth: 1941-05-01  Transition of Care [facility]) CM/SW Contact  Lauraine FORBES Saa, LCSW Phone Number: 11/19/2023, 4:36 PM  Clinical Narrative:     4:36 PM CSW followed up with patient on SNF decision. Patient accepted bed offer at [facility]. CSW informed SNF of acceptance and inquired about bed availability. CSW informed SNF of PICC and IV antibiotic needs.  Expected Discharge Plan: Skilled Nursing Facility Barriers to Discharge: Continued Medical Work up, SNF Pending bed offer  Expected Discharge Plan and Services In-house Referral: Clinical Social Work Discharge Planning Services: CM Consult Post Acute Care Choice: Skilled Nursing Facility Living arrangements for the past 2 months: Single Family Home                                       Social Determinants of Health (SDOH) Interventions SDOH Screenings   Food Insecurity: No Food Insecurity (11/14/2023)  Housing: Low Risk  (11/14/2023)  Transportation Needs: No Transportation Needs (11/14/2023)  Utilities: Not At Risk (11/14/2023)  Alcohol  Screen: Low Risk  (10/30/2023)  Depression (PHQ2-9): Low Risk  (11/09/2023)  Recent Concern: Depression (PHQ2-9) - Medium Risk (10/15/2023)  Financial Resource Strain: Low Risk  (10/30/2023)  Physical Activity: Insufficiently Active (10/30/2023)  Social Connections: Socially Integrated (11/14/2023)  Stress: No Stress Concern Present (10/30/2023)  Tobacco Use: Low Risk  (11/15/2023)  Health Literacy: Adequate Health Literacy (10/30/2023)    Readmission Risk Interventions    07/17/2023    3:28 PM 07/08/2023   10:09 AM 12/16/2022   11:09 AM  Readmission Risk Prevention Plan  Transportation Screening Complete Complete Complete  PCP or Specialist Appt within 5-7 Days   Complete  Home Care Screening   Complete  Medication Review (RN CM)   Complete  Medication Review (RN Care  Manager) Complete Referral to Pharmacy   PCP or Specialist appointment within 3-5 days of discharge Complete Complete   HRI or Home Care Consult Complete Complete   SW Recovery Care/Counseling Consult Complete    Palliative Care Screening Not Applicable Not Applicable   Skilled Nursing Facility Complete Complete

## 2023-11-19 NOTE — Progress Notes (Signed)
 Occupational Therapy Treatment Patient Details Name: [name] MRN: [id number] DOB: November 08, 1940 Today's Date: 11/19/2023   History of present illness 83 y.o. female admitted 6/27 from home reporting increased swelling  and pain in L arm. Negative for DVT. CT showed nondisplaced fracture of the posterior superior glenoid, a large loculated fluid collection from the glenohumeral joint into the proximal and mid biceps. S/p L Hemiarthroplasty 6/29. PMH: DM, HTN, CAD, RA,  s/p  colectomy with colostomy creation, CHF, COPD   OT comments  Patient up in recliner and asking to return to supine. Patient able to stand from recliner with min assist and transfer to EOB with min assist with step pivot transfer. Patient performed sit to stand from EOB to place sacral pad with min assist and limited standing tolerance. Patient required mod assist to return to supine with assistance for BLEs. Patient positioned in bed to allow for LUE elbow, wrist, and hand AROM exercises and encouraged to perform to address stiffness and edema. Pillow positioned to allow for proper positioning while in bed. Daughter present and educated on positioning and exercises. Patient will benefit from continued inpatient follow up therapy, <3 hours/day. Acute OT to continue to follow to address established goals to facilitate DC to next venue of care.        If plan is discharge home, recommend the following:      Equipment Recommendations  Hospital bed;Wheelchair cushion (measurements OT);Wheelchair (measurements OT);BSC/3in1    Recommendations for Other Services      Precautions / Restrictions Precautions Precautions: Fall;Shoulder Type of Shoulder Precautions: hemiarthroplasty Shoulder Interventions: At all times Recall of Precautions/Restrictions: Impaired Precaution/Restrictions Comments: allowed hand wrist and elbow movement Required Braces or Orthoses: Sling Restrictions Weight Bearing Restrictions Per Provider Order:  Yes LUE Weight Bearing Per Provider Order: Non weight bearing       Mobility Bed Mobility Overal bed mobility: Needs Assistance Bed Mobility: Sit to Supine       Sit to supine: Mod assist   General bed mobility comments: assistance with trunk and BLEs    Transfers Overall transfer level: Needs assistance Equipment used: 1 person hand held assist Transfers: Sit to/from Stand, Bed to chair/wheelchair/BSC Sit to Stand: Min assist     Step pivot transfers: Min assist     General transfer comment: HHA with cues for hand placement and safety     Balance Overall balance assessment: Needs assistance Sitting-balance support: No upper extremity supported, Feet supported Sitting balance-Leahy Scale: Fair Sitting balance - Comments: EOB   Standing balance support: During functional activity, Single extremity supported Standing balance-Leahy Scale: Poor Standing balance comment: UE support.                           ADL either performed or assessed with clinical judgement   ADL Overall ADL's : Needs assistance/impaired                                       General ADL Comments: focused on transfer back to bed and UE exercises    Extremity/Trunk Assessment              Vision       Perception     Praxis     Communication Communication Communication: No apparent difficulties   Cognition Arousal: Alert Behavior During Therapy: WFL for tasks assessed/performed Cognition: Cognition impaired  Following commands: Intact        Cueing   Cueing Techniques: Verbal cues, Gestural cues  Exercises Other Exercises Other Exercises: instructions on LUE finger and wrist flexion/extension exercises    Shoulder Instructions Shoulder Instructions Donning/doffing sling/immobilizer: Maximal assistance ROM for elbow, wrist and digits of operated UE: Supervision/safety Positioning of UE while sleeping:  Minimal assistance     General Comments VSS on 3 liters    Pertinent Vitals/ Pain       Pain Assessment Pain Assessment: Faces Faces Pain Scale: Hurts even more Pain Location: Lt shoulder and hip Pain Descriptors / Indicators: Grimacing, Guarding, Aching Pain Intervention(s): Limited activity within patient's tolerance, Monitored during session, Repositioned, Patient requesting pain meds-RN notified  Home Living                                          Prior Functioning/Environment              Frequency  Min 2X/week        Progress Toward Goals  OT Goals(current goals can now be found in the care plan section)  Progress towards OT goals: Progressing toward goals  Acute Rehab OT Goals Patient Stated Goal: to get better OT Goal Formulation: With patient Time For Goal Achievement: 11/30/23 Potential to Achieve Goals: Good ADL Goals Pt/caregiver will Perform Home Exercise Program: Left upper extremity;With written HEP provided;With minimal assist Additional ADL Goal #1: pt will complete bed mobility min (A) as precursor to adls. Additional ADL Goal #2: pt will complete bed to chair transfer min ()A  Plan      Co-evaluation                 AM-PAC OT 6 Clicks Daily Activity     Outcome Measure   Help from another person eating meals?: A Little Help from another person taking care of personal grooming?: A Little Help from another person toileting, which includes using toliet, bedpan, or urinal?: A Lot Help from another person bathing (including washing, rinsing, drying)?: A Lot Help from another person to put on and taking off regular upper body clothing?: A Lot Help from another person to put on and taking off regular lower body clothing?: Total 6 Click Score: 13    End of Session Equipment Utilized During Treatment: Oxygen   OT Visit Diagnosis: Unsteadiness on feet (R26.81);Muscle weakness (generalized) (M62.81)   Activity  Tolerance Patient tolerated treatment well   Patient Left in bed;with call bell/phone within reach;with family/visitor present   Nurse Communication Mobility status;Patient requests pain meds;Precautions        Time: 8770-8743 OT Time Calculation (min): 27 min  Charges: OT General Charges $OT Visit: 1 Visit OT Treatments $Therapeutic Activity: 8-22 mins $Therapeutic Exercise: 8-22 mins  Dick Laine, OTA Acute Rehabilitation Services  Office [phone]   Jeb LITTIE Laine 11/19/2023, 2:30 PM

## 2023-11-20 DIAGNOSIS — K5909 Other constipation: Secondary | ICD-10-CM | POA: Diagnosis not present

## 2023-11-20 DIAGNOSIS — E119 Type 2 diabetes mellitus without complications: Secondary | ICD-10-CM | POA: Diagnosis not present

## 2023-11-20 DIAGNOSIS — I5032 Chronic diastolic (congestive) heart failure: Secondary | ICD-10-CM | POA: Diagnosis not present

## 2023-11-20 DIAGNOSIS — M00012 Staphylococcal arthritis, left shoulder: Secondary | ICD-10-CM | POA: Diagnosis not present

## 2023-11-20 LAB — CULTURE, BLOOD (ROUTINE X 2)
Culture: NO GROWTH
Culture: NO GROWTH
Special Requests: ADEQUATE

## 2023-11-20 LAB — AEROBIC/ANAEROBIC CULTURE W GRAM STAIN (SURGICAL/DEEP WOUND)
Gram Stain: NONE SEEN
Gram Stain: NONE SEEN

## 2023-11-20 LAB — GLUCOSE, CAPILLARY
Glucose-Capillary: 82 mg/dL (ref 70–99)
Glucose-Capillary: 124 mg/dL — ABNORMAL HIGH (ref 70–99)
Glucose-Capillary: 152 mg/dL — ABNORMAL HIGH (ref 70–99)
Glucose-Capillary: 156 mg/dL — ABNORMAL HIGH (ref 70–99)

## 2023-11-20 NOTE — Plan of Care (Signed)
   Problem: Coping: Goal: Ability to adjust to condition or change in health will improve Outcome: Progressing   Problem: Fluid Volume: Goal: Ability to maintain a balanced intake and output will improve Outcome: Progressing   Problem: Metabolic: Goal: Ability to maintain appropriate glucose levels will improve Outcome: Progressing   Problem: Nutritional: Goal: Maintenance of adequate nutrition will improve Outcome: Progressing

## 2023-11-20 NOTE — Progress Notes (Addendum)
 Progress Note   Patient: [name] FMW:[id number] DOB: [birth year] DOA: 11/13/2023     7 DOS: the patient was seen and examined on 11/20/2023   Brief hospital course: 83 year old F with PMH of GIB due to sigmoid stricture s/p colectomy and colostomy creation in 06/2023, PE not on anticoagulation due to GIB, nonobstructive CAD, chronic chest pain, COPD, chronic hypoxic RF on 3 L, HFpEF, IDDM-2, rheumatoid arthritis, stage II sacral pressure ulcer, morbid obesity, HTN, dyslipidemia, hypothyroidism and chronic sinus tachycardia brought to ED by EMS due to increased swelling and pain of left arm and left shoulder, as well as left-sided chest pain.   CT angio chest negative for PE but concerning for multifocal pneumonia, nondisplaced fracture of superior left glenoid fossa and septic arthritis in left shoulder.  CT angio LUE showed nondisplaced fracture of posterosuperior glenoid, large loculated peripherally enhancing fluid collection extending from left glenohumeral joint into proximal and mid biceps musculature measuring 5.3 x 3.6 x 7.5 cm concerning for septic arthritis and intramuscular abscess, questionable developing humeral head osteomyelitis.  Orthopedic surgery consulted and recommended IR drainage and Ortho evaluation the morning. Blood cultures obtained.  Started on IV fluid and  broad-spectrum antibiotics   Patient had diagnostic aspiration of LUE intramuscular abscess.  Blood and abscess culture with MRSA.  ID consulted.  She also underwent left shoulder hemiarthroplasty by Dr. Cristy on 6/29. ID advised Daptomycin  700mg  daily until 12/27/23. PICC line placed 11/19/23.  Assessment and Plan: Left shoulder septic arthritis Left glenohumeral joint abscess formation Left upper extremity intramuscular abscess Nondisplaced fracture of posterior superior glenoid MRSA bacteremia-blood cultures as above. CRP 39.  ESR 106.  Hemodynamically stable. S/p IR aspiration of LUE intramuscular abscess by  IR on 6/28 S/p left shoulder hemiarthroplasty by Dr. Cristy Continue pain control, Tylenol  650 mg q6 hr PRN  ID recommendations appreciated IV daptomycin  700 mg daily with end date 12/27/23. PICC line placed 11/19/23. SNF placement pending bed offer.   Community-acquired pneumonia  Continue Mucinex  1200 mg PO bid, Duoneb q6 hr PRN  Pulmonary toilet.   Elevated troponin/history of nonobstructive CAD Likely demand ischemia. Continue Lipitor 10 mg PO daily    Chronic HFpEF Continue Lasix  40 mg PO daily, metoprolol .   Chronic COPD/chronic hypoxic RF on 3 L at baseline Duoneb q6 hr PRN  Prednisone  10 mg PO daily    Essential hypertension Lopressor  50 mg PO bid    Chronic pain syndrome/neuropathy Continue Cymbalta  60 mg PO daily, MS Contin  15 mg PO q12, Oxycodone  PRN and Gabapentin  300 mg PO bid    IDDM-2 with hyperglycemia and level 1 hypoglycemia: A1c 6.0% in 09/2023. Sugars stable. Stopped accucheks, sliding scale.    Rheumatoid arthritis On Prednisone  10 mg PO daily  Hold Arava  and Plaquenil  in the setting of active infection   Normocytic anemia Hb stable. Monitor H&H   History of PE Not on anticoagulation due to GI bleed.  CTA chest negative for PE.   History of sigmoid colon stricture s/p colectomy and colostomy creation February 2025 Continue colostomy care.   Hyperlipidemia Lipitor as above    Chronic constipation Continue MiraLAX  and senna, colace.   GERD Protonix  40 mg PO bid    Generalized anxiety disorder Cymbalta  as above    Hypothyroidism Synthroid  50 mcg PO daily    Class I obesity Body mass index is 34.14 kg/m. Diet, exercise and weight reduction advised.       Out of bed to chair. Incentive spirometry. Nursing supportive care.  Fall, aspiration precautions. Diet:  Diet Orders (From admission, onward)     Start     Ordered   11/15/23 1055  Diet Carb Modified Fluid consistency: Thin; Room service appropriate? Yes  Diet effective now        Question Answer Comment  Diet-HS Snack? Nothing   Calorie Level Medium 1600-2000   Fluid consistency: Thin   Room service appropriate? Yes      11/15/23 1054           DVT prophylaxis: heparin  injection 5,000 Units Start: 11/13/23 2200 SCDs Start: 11/13/23 2123 Place TED hose Start: 11/13/23 2123  Level of care: Telemetry Cardiac   Code Status: Full Code  Subjective: Patient is seen and examined today morning. She Denies any complaints. Feels better. No family at bedside.  Physical Exam: Vitals:   11/20/23 0717 11/20/23 0811 11/20/23 1144 11/20/23 1504  BP: (!) 145/91 (!) 145/91 (!) 144/74 (!) 151/79  Pulse: 96 96 89 82  Resp: 20  14 12   Temp: 99.2 F (37.3 C)  98.8 F (37.1 C) 98.2 F (36.8 C)  TempSrc: Oral  Oral Oral  SpO2:      Weight:      Height:        General - Elderly obese African [place] female, no apparent distress HEENT - PERRLA, EOMI, atraumatic head, non tender sinuses. Lung - Clear, basal rales, rhonchi, wheezes. Heart - S1, S2 heard, no murmurs, rubs, trace pedal edema. Abdomen - Soft, non tender, colostomy bag noted. Neuro - Alert, awake and oriented x 3, non focal exam. Skin - Warm and dry. Left shoulder dressing, sling noted.  Data Reviewed:      Latest Ref Rng & Units 11/18/2023    4:51 AM 11/17/2023    7:51 AM 11/16/2023    2:57 PM  CBC  WBC 4.0 - 10.5 K/uL 7.5  7.9  8.7   Hemoglobin 12.0 - 15.0 g/dL 8.6  8.4  8.9   Hematocrit 36.0 - 46.0 % 27.8  27.2  28.6   Platelets 150 - 400 K/uL 225  194  180       Latest Ref Rng & Units 11/18/2023    4:51 AM 11/17/2023    7:51 AM 11/16/2023    2:26 AM  BMP  Glucose 70 - 99 mg/dL 84  89  892   BUN 8 - 23 mg/dL 12  13  13    Creatinine 0.44 - 1.00 mg/dL 9.24  9.13  9.04   Sodium 135 - 145 mmol/L 139  139  139   Potassium 3.5 - 5.1 mmol/L 3.3  3.4  4.5   Chloride 98 - 111 mmol/L 102  103  103   CO2 22 - 32 mmol/L 28  26  27    Calcium  8.9 - 10.3 mg/dL 7.6  7.8  7.6    No results  found.   Family Communication: Discussed with patient, daughter at bedside. They understand and agree. All questions answered.  Disposition: Status is: Inpatient Remains inpatient appropriate because: long term IV antibiotics, PICC Line placed for SNF placement.  Planned Discharge Destination: Skilled nursing facility     Time spent: 38 minutes  Author: Concepcion Riser, MD 11/20/2023 4:32 PM Secure chat 7am to 7pm For on call review [email or web address].

## 2023-11-20 NOTE — Plan of Care (Signed)
  Problem: Metabolic: Goal: Ability to maintain appropriate glucose levels will improve Outcome: Progressing   Problem: Skin Integrity: Goal: Risk for impaired skin integrity will decrease Outcome: Progressing   Problem: Education: Goal: Knowledge of General Education information will improve Description: Including pain rating scale, medication(s)/side effects and non-pharmacologic comfort measures Outcome: Progressing   Problem: Nutrition: Goal: Adequate nutrition will be maintained Outcome: Progressing   Problem: Pain Managment: Goal: General experience of comfort will improve and/or be controlled Outcome: Progressing

## 2023-11-21 DIAGNOSIS — K5909 Other constipation: Secondary | ICD-10-CM | POA: Diagnosis not present

## 2023-11-21 DIAGNOSIS — M00012 Staphylococcal arthritis, left shoulder: Secondary | ICD-10-CM | POA: Diagnosis not present

## 2023-11-21 DIAGNOSIS — E119 Type 2 diabetes mellitus without complications: Secondary | ICD-10-CM | POA: Diagnosis not present

## 2023-11-21 DIAGNOSIS — I5032 Chronic diastolic (congestive) heart failure: Secondary | ICD-10-CM | POA: Diagnosis not present

## 2023-11-21 LAB — GLUCOSE, CAPILLARY
Glucose-Capillary: 97 mg/dL (ref 70–99)
Glucose-Capillary: 94 mg/dL (ref 70–99)
Glucose-Capillary: 233 mg/dL — ABNORMAL HIGH (ref 70–99)
Glucose-Capillary: 109 mg/dL — ABNORMAL HIGH (ref 70–99)

## 2023-11-21 NOTE — Progress Notes (Signed)
 Progress Note   Patient: [name] FMW:[id number] DOB: 1941/04/29 DOA: 11/13/2023     8 DOS: the patient was seen and examined on 11/21/2023   Brief hospital course: 83 year old F with PMH of GIB due to sigmoid stricture s/p colectomy and colostomy creation in 06/2023, PE not on anticoagulation due to GIB, nonobstructive CAD, chronic chest pain, COPD, chronic hypoxic RF on 3 L, HFpEF, IDDM-2, rheumatoid arthritis, stage II sacral pressure ulcer, morbid obesity, HTN, dyslipidemia, hypothyroidism and chronic sinus tachycardia brought to ED by EMS due to increased swelling and pain of left arm and left shoulder, as well as left-sided chest pain.   CT angio chest negative for PE but concerning for multifocal pneumonia, nondisplaced fracture of superior left glenoid fossa and septic arthritis in left shoulder.  CT angio LUE showed nondisplaced fracture of posterosuperior glenoid, large loculated peripherally enhancing fluid collection extending from left glenohumeral joint into proximal and mid biceps musculature measuring 5.3 x 3.6 x 7.5 cm concerning for septic arthritis and intramuscular abscess, questionable developing humeral head osteomyelitis.  Orthopedic surgery consulted and recommended IR drainage and Ortho evaluation the morning. Blood cultures obtained.  Started on IV fluid and  broad-spectrum antibiotics   Patient had diagnostic aspiration of LUE intramuscular abscess.  Blood and abscess culture with MRSA.  ID consulted.  She also underwent left shoulder hemiarthroplasty by Dr. Cristy on 6/29. ID advised Daptomycin  700mg  daily until 12/27/23. PICC line placed 11/19/23.  Assessment and Plan: Left shoulder septic arthritis Left glenohumeral joint abscess formation Left upper extremity intramuscular abscess Nondisplaced fracture of posterior superior glenoid MRSA bacteremia-blood cultures as above. CRP 39.  ESR 106.  Hemodynamically stable. S/p IR aspiration of LUE intramuscular abscess by  IR on 6/28 S/p left shoulder hemiarthroplasty by Dr. Cristy Continue pain control, Tylenol  650 mg q6 hr PRN  ID recommendations appreciated IV daptomycin  700 mg daily with end date 12/27/23. PICC line placed 11/19/23. SNF placement pending bed offer. Patient asked about external catheter so she wont contaminate her sacral wounds, advised to talk to SNF facility staff.   Community-acquired pneumonia  Continue Mucinex  1200 mg PO bid, Duoneb q6 hr PRN  Pulmonary toilet.   Elevated troponin/history of nonobstructive CAD Likely demand ischemia. Continue Lipitor 10 mg PO daily    Chronic HFpEF Continue Lasix  40 mg PO daily, metoprolol .   Chronic COPD/chronic hypoxic RF on 3 L at baseline Duoneb q6 hr PRN  Prednisone  10 mg PO daily    Essential hypertension Lopressor  50 mg PO bid    Chronic pain syndrome/neuropathy Continue Cymbalta  60 mg PO daily, MS Contin  15 mg PO q12, Oxycodone  PRN and Gabapentin  300 mg PO bid    IDDM-2 with hyperglycemia and level 1 hypoglycemia: A1c 6.0% in 09/2023. Sugars stable. Stopped accucheks, sliding scale.    Rheumatoid arthritis On Prednisone  10 mg PO daily  Hold Arava  and Plaquenil  in the setting of active infection   Normocytic anemia Hb stable. Monitor H&H   History of PE Not on anticoagulation due to GI bleed.  CTA chest negative for PE.   History of sigmoid colon stricture s/p colectomy and colostomy creation February 2025 Continue colostomy care.   Hyperlipidemia Lipitor as above    Chronic constipation Continue MiraLAX  and senna, colace.   GERD Protonix  40 mg PO bid    Generalized anxiety disorder Cymbalta  as above    Hypothyroidism Synthroid  50 mcg PO daily    Class I obesity Body mass index is 34.14 kg/m. Diet, exercise and  weight reduction advised.       Out of bed to chair. Incentive spirometry. Nursing supportive care. Fall, aspiration precautions. Diet:  Diet Orders (From admission, onward)     Start      Ordered   11/15/23 1055  Diet Carb Modified Fluid consistency: Thin; Room service appropriate? Yes  Diet effective now       Question Answer Comment  Diet-HS Snack? Nothing   Calorie Level Medium 1600-2000   Fluid consistency: Thin   Room service appropriate? Yes      11/15/23 1054           DVT prophylaxis: heparin  injection 5,000 Units Start: 11/13/23 2200 SCDs Start: 11/13/23 2123 Place TED hose Start: 11/13/23 2123  Level of care: Telemetry Cardiac   Code Status: Full Code  Subjective: Patient is seen and examined today morning. She asked about external catheter upon discharge. Advised depends on facility policy. No family at bedside.  Physical Exam: Vitals:   11/21/23 0205 11/21/23 0431 11/21/23 0754 11/21/23 1031  BP: (!) 148/86 (!) 142/85  127/68  Pulse:  84 96   Resp: (!) 9 14    Temp:  98.2 F (36.8 C) 97.6 F (36.4 C) 98.2 F (36.8 C)  TempSrc:  Oral Oral Oral  SpO2: 99% 99% 94% 94%  Weight:      Height:        General - Elderly obese [place] female, no apparent distress HEENT - PERRLA, EOMI, atraumatic head, non tender sinuses. Lung - Clear, basal rales, rhonchi, wheezes. Heart - S1, S2 heard, no murmurs, rubs, trace pedal edema. Abdomen - Soft, non tender, colostomy bag noted. Neuro - Alert, awake and oriented x 3, non focal exam. Skin - Warm and dry. Left shoulder dressing, sling noted.  Data Reviewed:      Latest Ref Rng & Units 11/18/2023    4:51 AM 11/17/2023    7:51 AM 11/16/2023    2:57 PM  CBC  WBC 4.0 - 10.5 K/uL 7.5  7.9  8.7   Hemoglobin 12.0 - 15.0 g/dL 8.6  8.4  8.9   Hematocrit 36.0 - 46.0 % 27.8  27.2  28.6   Platelets 150 - 400 K/uL 225  194  180       Latest Ref Rng & Units 11/18/2023    4:51 AM 11/17/2023    7:51 AM 11/16/2023    2:26 AM  BMP  Glucose 70 - 99 mg/dL 84  89  892   BUN 8 - 23 mg/dL 12  13  13    Creatinine 0.44 - 1.00 mg/dL 9.24  9.13  9.04   Sodium 135 - 145 mmol/L 139  139  139   Potassium 3.5 - 5.1  mmol/L 3.3  3.4  4.5   Chloride 98 - 111 mmol/L 102  103  103   CO2 22 - 32 mmol/L 28  26  27    Calcium  8.9 - 10.3 mg/dL 7.6  7.8  7.6    No results found.   Family Communication: Discussed with patient, she understand and agree. All questions answered.  Disposition: Status is: Inpatient Remains inpatient appropriate because: long term IV antibiotics, PICC Line placed for SNF placement.  Planned Discharge Destination: Skilled nursing facility pending bed offer.     Time spent: 39 minutes  Author: Concepcion Riser, MD 11/21/2023 1:08 PM Secure chat 7am to 7pm For on call review [email or web address].

## 2023-11-21 NOTE — Plan of Care (Signed)
  Problem: Coping: Goal: Ability to adjust to condition or change in health will improve Outcome: Progressing   Problem: Skin Integrity: Goal: Risk for impaired skin integrity will decrease Outcome: Progressing   Problem: Activity: Goal: Risk for activity intolerance will decrease Outcome: Progressing   Problem: Pain Managment: Goal: General experience of comfort will improve and/or be controlled Outcome: Progressing

## 2023-11-21 NOTE — Plan of Care (Signed)
   Problem: Education: Goal: Ability to describe self-care measures that may prevent or decrease complications (Diabetes Survival Skills Education) will improve Outcome: Progressing   Problem: Coping: Goal: Ability to adjust to condition or change in health will improve Outcome: Progressing   Problem: Fluid Volume: Goal: Ability to maintain a balanced intake and output will improve Outcome: Progressing

## 2023-11-21 NOTE — Progress Notes (Signed)
 Occupational Therapy Treatment Patient Details Name: [name] MRN: [id number] DOB: Mar 26, 1941 Today's Date: 11/21/2023   History of present illness 83 y.o. female admitted 6/27 from home reporting increased swelling  and pain in L arm. Negative for DVT. CT showed nondisplaced fracture of the posterior superior glenoid, a large loculated fluid collection from the glenohumeral joint into the proximal and mid biceps. S/p L Hemiarthroplasty 6/29. PMH: DM, HTN, CAD, RA,  s/p  colectomy with colostomy creation, CHF, COPD   OT comments  Patient received in supine with LUE sling ill fitting. Patient tolerated AROM to LUE elbow, wrist, and hand with edema noted. Patient's sling readjusted for betting fitting and elbow positioned with pillow to address edema. Green foam provided to for finger flexion exercises. Patient will benefit from continued inpatient follow up therapy, <3 hours/day. Acute OT to continue to follow to address established goals to facilitate DC to next venue of care.        If plan is discharge home, recommend the following:      Equipment Recommendations  Hospital bed;Wheelchair cushion (measurements OT);Wheelchair (measurements OT);BSC/3in1    Recommendations for Other Services      Precautions / Restrictions Precautions Precautions: Fall;Shoulder Type of Shoulder Precautions: hemiarthroplasty Shoulder Interventions: At all times Recall of Precautions/Restrictions: Impaired Precaution/Restrictions Comments: allowed hand wrist and elbow movement Required Braces or Orthoses: Sling Restrictions Weight Bearing Restrictions Per Provider Order: Yes LUE Weight Bearing Per Provider Order: Non weight bearing       Mobility Bed Mobility                    Transfers                         Balance                                           ADL either performed or assessed with clinical judgement   ADL Overall ADL's : Needs  assistance/impaired                                       General ADL Comments: focuses on ROM to LUE elbow, wrist, and hand    Extremity/Trunk Assessment              Vision       Perception     Praxis     Communication Communication Communication: No apparent difficulties   Cognition Arousal: Alert Behavior During Therapy: WFL for tasks assessed/performed Cognition: No apparent impairments             OT - Cognition Comments: cognition appears to be improving. Patient able to recall therapist from previous session                 Following commands: Intact        Cueing   Cueing Techniques: Verbal cues, Gestural cues  Exercises Exercises: Other exercises Other Exercises Other Exercises: provided green sponge for hand exercises to LUE    Shoulder Instructions Shoulder Instructions Donning/doffing sling/immobilizer: Maximal assistance (replaced sling on LUE to allow for better support) ROM for elbow, wrist and digits of operated UE: Supervision/safety Positioning of UE while sleeping: Minimal assistance (Positioned LUE with pillows to address comfort and edema)  General Comments SpO2 94% on 2 liters.    Pertinent Vitals/ Pain       Pain Assessment Pain Assessment: Faces Faces Pain Scale: Hurts even more Pain Location: Lt shoulder Pain Descriptors / Indicators: Grimacing, Guarding, Aching Pain Intervention(s): Limited activity within patient's tolerance, Monitored during session, Repositioned, RN gave pain meds during session  Home Living                                          Prior Functioning/Environment              Frequency  Min 2X/week        Progress Toward Goals  OT Goals(current goals can now be found in the care plan section)  Progress towards OT goals: Progressing toward goals  Acute Rehab OT Goals Patient Stated Goal: to get better OT Goal Formulation: With patient Time For  Goal Achievement: 11/30/23 Potential to Achieve Goals: Good ADL Goals Pt/caregiver will Perform Home Exercise Program: Left upper extremity;With written HEP provided;With minimal assist Additional ADL Goal #1: pt will complete bed mobility min (A) as precursor to adls. Additional ADL Goal #2: pt will complete bed to chair transfer min ()A  Plan      Co-evaluation                 AM-PAC OT 6 Clicks Daily Activity     Outcome Measure   Help from another person eating meals?: A Little Help from another person taking care of personal grooming?: A Little Help from another person toileting, which includes using toliet, bedpan, or urinal?: A Lot Help from another person bathing (including washing, rinsing, drying)?: A Lot Help from another person to put on and taking off regular upper body clothing?: A Lot Help from another person to put on and taking off regular lower body clothing?: Total 6 Click Score: 13    End of Session Equipment Utilized During Treatment: Oxygen   OT Visit Diagnosis: Unsteadiness on feet (R26.81);Muscle weakness (generalized) (M62.81)   Activity Tolerance Patient tolerated treatment well   Patient Left in bed;with call bell/phone within reach;with family/visitor present   Nurse Communication Mobility status        Time: 1325-1345 OT Time Calculation (min): 20 min  Charges: OT General Charges $OT Visit: 1 Visit OT Treatments $Therapeutic Exercise: 8-22 mins  Dick Laine, OTA Acute Rehabilitation Services  Office [phone]   Jeb LITTIE Laine 11/21/2023, 2:14 PM

## 2023-11-22 DIAGNOSIS — I110 Hypertensive heart disease with heart failure: Secondary | ICD-10-CM | POA: Diagnosis present

## 2023-11-22 DIAGNOSIS — F4323 Adjustment disorder with mixed anxiety and depressed mood: Secondary | ICD-10-CM | POA: Diagnosis not present

## 2023-11-22 DIAGNOSIS — Z8709 Personal history of other diseases of the respiratory system: Secondary | ICD-10-CM | POA: Diagnosis not present

## 2023-11-22 DIAGNOSIS — I2510 Atherosclerotic heart disease of native coronary artery without angina pectoris: Secondary | ICD-10-CM | POA: Diagnosis not present

## 2023-11-22 DIAGNOSIS — M6281 Muscle weakness (generalized): Secondary | ICD-10-CM | POA: Diagnosis not present

## 2023-11-22 DIAGNOSIS — N179 Acute kidney failure, unspecified: Secondary | ICD-10-CM | POA: Diagnosis not present

## 2023-11-22 DIAGNOSIS — M25512 Pain in left shoulder: Secondary | ICD-10-CM | POA: Diagnosis not present

## 2023-11-22 DIAGNOSIS — E785 Hyperlipidemia, unspecified: Secondary | ICD-10-CM | POA: Diagnosis not present

## 2023-11-22 DIAGNOSIS — M00019 Staphylococcal arthritis, unspecified shoulder: Secondary | ICD-10-CM | POA: Diagnosis not present

## 2023-11-22 DIAGNOSIS — R6520 Severe sepsis without septic shock: Secondary | ICD-10-CM | POA: Diagnosis present

## 2023-11-22 DIAGNOSIS — R2689 Other abnormalities of gait and mobility: Secondary | ICD-10-CM | POA: Diagnosis not present

## 2023-11-22 DIAGNOSIS — J704 Drug-induced interstitial lung disorders, unspecified: Secondary | ICD-10-CM | POA: Diagnosis present

## 2023-11-22 DIAGNOSIS — K219 Gastro-esophageal reflux disease without esophagitis: Secondary | ICD-10-CM | POA: Diagnosis not present

## 2023-11-22 DIAGNOSIS — J9611 Chronic respiratory failure with hypoxia: Secondary | ICD-10-CM | POA: Diagnosis not present

## 2023-11-22 DIAGNOSIS — I509 Heart failure, unspecified: Secondary | ICD-10-CM | POA: Diagnosis not present

## 2023-11-22 DIAGNOSIS — E782 Mixed hyperlipidemia: Secondary | ICD-10-CM | POA: Diagnosis not present

## 2023-11-22 DIAGNOSIS — R799 Abnormal finding of blood chemistry, unspecified: Secondary | ICD-10-CM | POA: Diagnosis not present

## 2023-11-22 DIAGNOSIS — D849 Immunodeficiency, unspecified: Secondary | ICD-10-CM | POA: Diagnosis not present

## 2023-11-22 DIAGNOSIS — R41841 Cognitive communication deficit: Secondary | ICD-10-CM | POA: Diagnosis not present

## 2023-11-22 DIAGNOSIS — J449 Chronic obstructive pulmonary disease, unspecified: Secondary | ICD-10-CM | POA: Diagnosis not present

## 2023-11-22 DIAGNOSIS — D631 Anemia in chronic kidney disease: Secondary | ICD-10-CM | POA: Diagnosis present

## 2023-11-22 DIAGNOSIS — Z86711 Personal history of pulmonary embolism: Secondary | ICD-10-CM | POA: Diagnosis not present

## 2023-11-22 DIAGNOSIS — J80 Acute respiratory distress syndrome: Secondary | ICD-10-CM | POA: Diagnosis not present

## 2023-11-22 DIAGNOSIS — I272 Pulmonary hypertension, unspecified: Secondary | ICD-10-CM | POA: Diagnosis not present

## 2023-11-22 DIAGNOSIS — F419 Anxiety disorder, unspecified: Secondary | ICD-10-CM | POA: Diagnosis not present

## 2023-11-22 DIAGNOSIS — J690 Pneumonitis due to inhalation of food and vomit: Secondary | ICD-10-CM | POA: Diagnosis not present

## 2023-11-22 DIAGNOSIS — G894 Chronic pain syndrome: Secondary | ICD-10-CM | POA: Diagnosis not present

## 2023-11-22 DIAGNOSIS — E039 Hypothyroidism, unspecified: Secondary | ICD-10-CM | POA: Diagnosis not present

## 2023-11-22 DIAGNOSIS — R109 Unspecified abdominal pain: Secondary | ICD-10-CM | POA: Diagnosis not present

## 2023-11-22 DIAGNOSIS — E876 Hypokalemia: Secondary | ICD-10-CM | POA: Diagnosis not present

## 2023-11-22 DIAGNOSIS — R7881 Bacteremia: Secondary | ICD-10-CM | POA: Diagnosis not present

## 2023-11-22 DIAGNOSIS — M25511 Pain in right shoulder: Secondary | ICD-10-CM | POA: Diagnosis not present

## 2023-11-22 DIAGNOSIS — Z96612 Presence of left artificial shoulder joint: Secondary | ICD-10-CM | POA: Diagnosis not present

## 2023-11-22 DIAGNOSIS — J849 Interstitial pulmonary disease, unspecified: Secondary | ICD-10-CM | POA: Diagnosis not present

## 2023-11-22 DIAGNOSIS — Z933 Colostomy status: Secondary | ICD-10-CM | POA: Diagnosis not present

## 2023-11-22 DIAGNOSIS — M00012 Staphylococcal arthritis, left shoulder: Secondary | ICD-10-CM | POA: Diagnosis present

## 2023-11-22 DIAGNOSIS — R1032 Left lower quadrant pain: Secondary | ICD-10-CM | POA: Diagnosis not present

## 2023-11-22 DIAGNOSIS — L89623 Pressure ulcer of left heel, stage 3: Secondary | ICD-10-CM | POA: Diagnosis not present

## 2023-11-22 DIAGNOSIS — B9562 Methicillin resistant Staphylococcus aureus infection as the cause of diseases classified elsewhere: Secondary | ICD-10-CM | POA: Diagnosis not present

## 2023-11-22 DIAGNOSIS — T8459XD Infection and inflammatory reaction due to other internal joint prosthesis, subsequent encounter: Secondary | ICD-10-CM | POA: Diagnosis not present

## 2023-11-22 DIAGNOSIS — Z7401 Bed confinement status: Secondary | ICD-10-CM | POA: Diagnosis not present

## 2023-11-22 DIAGNOSIS — F411 Generalized anxiety disorder: Secondary | ICD-10-CM | POA: Diagnosis not present

## 2023-11-22 DIAGNOSIS — R0602 Shortness of breath: Secondary | ICD-10-CM | POA: Diagnosis not present

## 2023-11-22 DIAGNOSIS — I5032 Chronic diastolic (congestive) heart failure: Secondary | ICD-10-CM | POA: Diagnosis not present

## 2023-11-22 DIAGNOSIS — J9621 Acute and chronic respiratory failure with hypoxia: Secondary | ICD-10-CM | POA: Diagnosis not present

## 2023-11-22 DIAGNOSIS — E860 Dehydration: Secondary | ICD-10-CM | POA: Diagnosis present

## 2023-11-22 DIAGNOSIS — K529 Noninfective gastroenteritis and colitis, unspecified: Secondary | ICD-10-CM | POA: Diagnosis not present

## 2023-11-22 DIAGNOSIS — L89153 Pressure ulcer of sacral region, stage 3: Secondary | ICD-10-CM | POA: Diagnosis not present

## 2023-11-22 DIAGNOSIS — R2681 Unsteadiness on feet: Secondary | ICD-10-CM | POA: Diagnosis not present

## 2023-11-22 DIAGNOSIS — N281 Cyst of kidney, acquired: Secondary | ICD-10-CM | POA: Diagnosis not present

## 2023-11-22 DIAGNOSIS — N200 Calculus of kidney: Secondary | ICD-10-CM | POA: Diagnosis not present

## 2023-11-22 DIAGNOSIS — R634 Abnormal weight loss: Secondary | ICD-10-CM | POA: Diagnosis not present

## 2023-11-22 DIAGNOSIS — K5730 Diverticulosis of large intestine without perforation or abscess without bleeding: Secondary | ICD-10-CM | POA: Diagnosis not present

## 2023-11-22 DIAGNOSIS — L89152 Pressure ulcer of sacral region, stage 2: Secondary | ICD-10-CM | POA: Diagnosis present

## 2023-11-22 DIAGNOSIS — T50905A Adverse effect of unspecified drugs, medicaments and biological substances, initial encounter: Secondary | ICD-10-CM | POA: Diagnosis not present

## 2023-11-22 DIAGNOSIS — D696 Thrombocytopenia, unspecified: Secondary | ICD-10-CM | POA: Diagnosis not present

## 2023-11-22 DIAGNOSIS — L89154 Pressure ulcer of sacral region, stage 4: Secondary | ICD-10-CM | POA: Diagnosis not present

## 2023-11-22 DIAGNOSIS — G8929 Other chronic pain: Secondary | ICD-10-CM | POA: Diagnosis not present

## 2023-11-22 DIAGNOSIS — R000 Tachycardia, unspecified: Secondary | ICD-10-CM | POA: Diagnosis not present

## 2023-11-22 DIAGNOSIS — G629 Polyneuropathy, unspecified: Secondary | ICD-10-CM | POA: Diagnosis not present

## 2023-11-22 DIAGNOSIS — A419 Sepsis, unspecified organism: Secondary | ICD-10-CM | POA: Diagnosis not present

## 2023-11-22 DIAGNOSIS — M129 Arthropathy, unspecified: Secondary | ICD-10-CM | POA: Diagnosis not present

## 2023-11-22 DIAGNOSIS — Z9981 Dependence on supplemental oxygen: Secondary | ICD-10-CM | POA: Diagnosis not present

## 2023-11-22 DIAGNOSIS — R5381 Other malaise: Secondary | ICD-10-CM | POA: Diagnosis not present

## 2023-11-22 DIAGNOSIS — J029 Acute pharyngitis, unspecified: Secondary | ICD-10-CM | POA: Diagnosis not present

## 2023-11-22 DIAGNOSIS — M6259 Muscle wasting and atrophy, not elsewhere classified, multiple sites: Secondary | ICD-10-CM | POA: Diagnosis not present

## 2023-11-22 DIAGNOSIS — M009 Pyogenic arthritis, unspecified: Secondary | ICD-10-CM | POA: Diagnosis not present

## 2023-11-22 DIAGNOSIS — J189 Pneumonia, unspecified organism: Secondary | ICD-10-CM | POA: Diagnosis not present

## 2023-11-22 DIAGNOSIS — E1142 Type 2 diabetes mellitus with diabetic polyneuropathy: Secondary | ICD-10-CM | POA: Diagnosis not present

## 2023-11-22 DIAGNOSIS — R918 Other nonspecific abnormal finding of lung field: Secondary | ICD-10-CM | POA: Diagnosis not present

## 2023-11-22 DIAGNOSIS — Z452 Encounter for adjustment and management of vascular access device: Secondary | ICD-10-CM | POA: Diagnosis not present

## 2023-11-22 DIAGNOSIS — I10 Essential (primary) hypertension: Secondary | ICD-10-CM | POA: Diagnosis not present

## 2023-11-22 DIAGNOSIS — J9601 Acute respiratory failure with hypoxia: Secondary | ICD-10-CM | POA: Diagnosis not present

## 2023-11-22 DIAGNOSIS — Z1152 Encounter for screening for COVID-19: Secondary | ICD-10-CM | POA: Diagnosis not present

## 2023-11-22 DIAGNOSIS — D649 Anemia, unspecified: Secondary | ICD-10-CM | POA: Diagnosis not present

## 2023-11-22 DIAGNOSIS — E46 Unspecified protein-calorie malnutrition: Secondary | ICD-10-CM | POA: Diagnosis not present

## 2023-11-22 DIAGNOSIS — M0000 Staphylococcal arthritis, unspecified joint: Secondary | ICD-10-CM | POA: Diagnosis not present

## 2023-11-22 DIAGNOSIS — J811 Chronic pulmonary edema: Secondary | ICD-10-CM | POA: Diagnosis not present

## 2023-11-22 DIAGNOSIS — T847XXD Infection and inflammatory reaction due to other internal orthopedic prosthetic devices, implants and grafts, subsequent encounter: Secondary | ICD-10-CM | POA: Diagnosis not present

## 2023-11-22 DIAGNOSIS — Z471 Aftercare following joint replacement surgery: Secondary | ICD-10-CM | POA: Diagnosis not present

## 2023-11-22 DIAGNOSIS — J8410 Pulmonary fibrosis, unspecified: Secondary | ICD-10-CM | POA: Diagnosis not present

## 2023-11-22 DIAGNOSIS — D509 Iron deficiency anemia, unspecified: Secondary | ICD-10-CM | POA: Diagnosis not present

## 2023-11-22 DIAGNOSIS — M069 Rheumatoid arthritis, unspecified: Secondary | ICD-10-CM | POA: Diagnosis not present

## 2023-11-22 DIAGNOSIS — J8281 Chronic eosinophilic pneumonia: Secondary | ICD-10-CM | POA: Diagnosis present

## 2023-11-22 DIAGNOSIS — R0902 Hypoxemia: Secondary | ICD-10-CM | POA: Diagnosis not present

## 2023-11-22 DIAGNOSIS — A4102 Sepsis due to Methicillin resistant Staphylococcus aureus: Secondary | ICD-10-CM | POA: Diagnosis present

## 2023-11-22 DIAGNOSIS — Z741 Need for assistance with personal care: Secondary | ICD-10-CM | POA: Diagnosis not present

## 2023-11-22 DIAGNOSIS — R531 Weakness: Secondary | ICD-10-CM | POA: Diagnosis not present

## 2023-11-22 DIAGNOSIS — Z743 Need for continuous supervision: Secondary | ICD-10-CM | POA: Diagnosis not present

## 2023-11-22 DIAGNOSIS — D638 Anemia in other chronic diseases classified elsewhere: Secondary | ICD-10-CM | POA: Diagnosis not present

## 2023-11-22 DIAGNOSIS — G3184 Mild cognitive impairment, so stated: Secondary | ICD-10-CM | POA: Diagnosis not present

## 2023-11-22 DIAGNOSIS — T368X5A Adverse effect of other systemic antibiotics, initial encounter: Secondary | ICD-10-CM | POA: Diagnosis present

## 2023-11-22 LAB — GLUCOSE, CAPILLARY
Glucose-Capillary: 107 mg/dL — ABNORMAL HIGH (ref 70–99)
Glucose-Capillary: 84 mg/dL (ref 70–99)
Glucose-Capillary: 214 mg/dL — ABNORMAL HIGH (ref 70–99)

## 2023-11-22 MED ORDER — POTASSIUM CHLORIDE CRYS ER 20 MEQ PO TBCR: 40.0000 meq | EXTENDED_RELEASE_TABLET | Freq: Two times a day (BID) | ORAL | Status: DC

## 2023-11-22 MED ORDER — METOPROLOL TARTRATE 25 MG PO TABS: 50.0000 mg | ORAL_TABLET | Freq: Two times a day (BID) | ORAL | Status: DC

## 2023-11-22 MED ORDER — ENSURE PLUS HIGH PROTEIN PO LIQD
237.0000 mL | Freq: Two times a day (BID) | ORAL | Status: AC
Start: 2023-11-22 — End: ?

## 2023-11-22 MED ORDER — MORPHINE SULFATE ER 15 MG PO TBCR: 15.0000 mg | EXTENDED_RELEASE_TABLET | Freq: Two times a day (BID) | ORAL | 0 refills | Status: DC

## 2023-11-22 MED ORDER — DAPTOMYCIN IV (FOR PTA / DISCHARGE USE ONLY): 700.0000 mg | INTRAVENOUS | 0 refills | Status: DC

## 2023-11-22 MED ORDER — ENSURE PLUS HIGH PROTEIN PO LIQD: 237.0000 mL | Freq: Two times a day (BID) | ORAL | Status: DC

## 2023-11-22 NOTE — TOC Progression Note (Addendum)
 Transition of Care [facility]) - Progression Note    Patient Details  Name: [name] MRN: [id number] Date of Birth: January 26, 1941  [facility]) CM/SW Contact  Isaiah Public, LCSWA Phone Number: 11/22/2023, 9:39 AM  Clinical Narrative:     Patient has SNF bed at [facility]. CSW awaiting to hear back from [place] with [facility] on when facility can accept patient if medically stable. CSW will continue to follow.  Update- CSW spoke with [place] with [facility]. Myrene informed CSW that facility unable to accept patient today. CSW informed Myrene that patient has PICC and on IV antibiotics. Myrene informed CSW that facility can accept patient tomorrow. CSW informed MD.  Expected Discharge Plan: Skilled Nursing Facility Barriers to Discharge: Continued Medical Work up, SNF Pending bed offer  Expected Discharge Plan and Services In-house Referral: Clinical Social Work Discharge Planning Services: CM Consult Post Acute Care Choice: Skilled Nursing Facility Living arrangements for the past 2 months: Single Family Home                                       Social Determinants of Health (SDOH) Interventions SDOH Screenings   Food Insecurity: No Food Insecurity (11/14/2023)  Housing: Low Risk  (11/14/2023)  Transportation Needs: No Transportation Needs (11/14/2023)  Utilities: Not At Risk (11/14/2023)  Alcohol  Screen: Low Risk  (10/30/2023)  Depression (PHQ2-9): Low Risk  (11/09/2023)  Recent Concern: Depression (PHQ2-9) - Medium Risk (10/15/2023)  Financial Resource Strain: Low Risk  (10/30/2023)  Physical Activity: Insufficiently Active (10/30/2023)  Social Connections: Socially Integrated (11/14/2023)  Stress: No Stress Concern Present (10/30/2023)  Tobacco Use: Low Risk  (11/15/2023)  Health Literacy: Adequate Health Literacy (10/30/2023)    Readmission Risk Interventions    07/17/2023    3:28 PM 07/08/2023   10:09 AM 12/16/2022   11:09 AM  Readmission Risk  Prevention Plan  Transportation Screening Complete Complete Complete  PCP or Specialist Appt within 5-7 Days   Complete  Home Care Screening   Complete  Medication Review (RN CM)   Complete  Medication Review (RN Care Manager) Complete Referral to Pharmacy   PCP or Specialist appointment within 3-5 days of discharge Complete Complete   HRI or Home Care Consult Complete Complete   SW Recovery Care/Counseling Consult Complete    Palliative Care Screening Not Applicable Not Applicable   Skilled Nursing Facility Complete Complete

## 2023-11-22 NOTE — Progress Notes (Signed)
 Report given to [facility] rehab

## 2023-11-22 NOTE — Plan of Care (Signed)
   Problem: Education: Goal: Ability to describe self-care measures that may prevent or decrease complications (Diabetes Survival Skills Education) will improve Outcome: Progressing   Problem: Coping: Goal: Ability to adjust to condition or change in health will improve Outcome: Progressing   Problem: Fluid Volume: Goal: Ability to maintain a balanced intake and output will improve Outcome: Progressing

## 2023-11-22 NOTE — TOC Transition Note (Signed)
 Transition of Care [facility]) - Discharge Note   Patient Details  Name: [name] MRN: [id number] Date of Birth: 02-03-1941  Transition of Care [facility]) CM/SW Contact:  Isaiah Public, LCSWA Phone Number: 11/22/2023, 12:30 PM   Clinical Narrative:     Patient will DC to: [facility]   Anticipated DC date: 11/22/2023  Family notified: Morna  Transport by: [facility]  ?  Per MD patient ready for DC to [facility] . RN, patient, patient's family, and facility notified of DC. Discharge Summary sent to facility. RN given number for report [phone] RM#[id number]. DC packet on chart. Ambulance transport requested for patient.  CSW signing off.   Final next level of care: Skilled Nursing Facility Barriers to Discharge: No Barriers Identified   Patient Goals and CMS Choice Patient states their goals for this hospitalization and ongoing recovery are:: SNF   Choice offered to / list presented to : Patient      Discharge Placement              Patient chooses bed at: [facility] Patient to be transferred to facility by: [facility] Name of family member notified: Surena Patient and family notified of of transfer: 11/22/23  Discharge Plan and Services Additional resources added to the After Visit Summary for   In-house Referral: Clinical Social Work Discharge Planning Services: CM Consult Post Acute Care Choice: Skilled Nursing Facility                               Social Drivers of Health (SDOH) Interventions SDOH Screenings   Food Insecurity: No Food Insecurity (11/14/2023)  Housing: Low Risk  (11/14/2023)  Transportation Needs: No Transportation Needs (11/14/2023)  Utilities: Not At Risk (11/14/2023)  Alcohol  Screen: Low Risk  (10/30/2023)  Depression (PHQ2-9): Low Risk  (11/09/2023)  Recent Concern: Depression (PHQ2-9) - Medium Risk (10/15/2023)  Financial Resource Strain: Low Risk  (10/30/2023)  Physical Activity:  Insufficiently Active (10/30/2023)  Social Connections: Socially Integrated (11/14/2023)  Stress: No Stress Concern Present (10/30/2023)  Tobacco Use: Low Risk  (11/15/2023)  Health Literacy: Adequate Health Literacy (10/30/2023)     Readmission Risk Interventions    07/17/2023    3:28 PM 07/08/2023   10:09 AM 12/16/2022   11:09 AM  Readmission Risk Prevention Plan  Transportation Screening Complete Complete Complete  PCP or Specialist Appt within 5-7 Days   Complete  Home Care Screening   Complete  Medication Review (RN CM)   Complete  Medication Review (RN Care Manager) Complete Referral to Pharmacy   PCP or Specialist appointment within 3-5 days of discharge Complete Complete   HRI or Home Care Consult Complete Complete   SW Recovery Care/Counseling Consult Complete    Palliative Care Screening Not Applicable Not Applicable   Skilled Nursing Facility Complete Complete

## 2023-11-22 NOTE — Discharge Summary (Addendum)
 Physician Discharge Summary   Patient: [name] MRN: [id number] DOB: [birth year]  Admit date:     11/13/2023  Discharge date: 11/22/23  Discharge Physician: Concepcion Riser   PCP: Mercer Clotilda SAUNDERS, MD   Recommendations at discharge:   PCP follow up in 1 week. Follow H/H as she is on eliquis  for post op DVT prevention. ID and ortho follow up as scheduled. OPAT team for antibiotic management.  Discharge Diagnoses: Principal Problem:   Septic arthritis (HCC) Active Problems:   Hyperlipidemia   CAP (community acquired pneumonia)   Hypothyroidism   Rheumatoid arthritis (HCC)   Debility   Chronic hypoxic respiratory failure (HCC)   Depression   Insulin  dependent type 2 diabetes mellitus (HCC)   Chronic chest pain   Chronic pain syndrome   Diabetic neuropathy (HCC)   Sinus tachycardia   History of pulmonary embolus (PE)   Chronic diastolic CHF (congestive heart failure) (HCC)   GAD (generalized anxiety disorder)   SIRS (systemic inflammatory response syndrome) (HCC)   Elevated troponin   History of GI bleed   History of CAD (coronary artery disease)   Stage IV pressure ulcer of sacral region (HCC)   GERD (gastroesophageal reflux disease)   Chronic constipation   MRSA bacteremia   Therapeutic drug monitoring  Resolved Problems:   Acute combined systolic and diastolic congestive heart failure [facility])  Hospital Course: 83 year old F with PMH of GIB due to sigmoid stricture s/p colectomy and colostomy creation in 06/2023, PE not on anticoagulation due to GIB, nonobstructive CAD, chronic chest pain, COPD, chronic hypoxic RF on 3 L, HFpEF, IDDM-2, rheumatoid arthritis, stage II sacral pressure ulcer, morbid obesity, HTN, dyslipidemia, hypothyroidism and chronic sinus tachycardia brought to ED by EMS due to increased swelling and pain of left arm and left shoulder, as well as left-sided chest pain.   CT angio chest negative for PE but concerning for multifocal pneumonia,  nondisplaced fracture of superior left glenoid fossa and septic arthritis in left shoulder.  CT angio LUE showed nondisplaced fracture of posterosuperior glenoid, large loculated peripherally enhancing fluid collection extending from left glenohumeral joint into proximal and mid biceps musculature measuring 5.3 x 3.6 x 7.5 cm concerning for septic arthritis and intramuscular abscess, questionable developing humeral head osteomyelitis.  Orthopedic surgery consulted and recommended IR drainage and Ortho evaluation the morning. Blood cultures obtained.  Started on IV fluid and  broad-spectrum antibiotics   Patient had diagnostic aspiration of LUE intramuscular abscess.  Blood and abscess culture with MRSA.  ID consulted.  She also underwent left shoulder hemiarthroplasty by Dr. Cristy on 6/29. ID advised Daptomycin  700mg  daily until 12/27/23. PICC line placed 11/19/23. PT/ OT advised SNF placement.   Assessment and Plan: Left shoulder septic arthritis Left glenohumeral joint abscess formation Left upper extremity intramuscular abscess Nondisplaced fracture of posterior superior glenoid MRSA bacteremia S/p IR aspiration of LUE intramuscular abscess by IR on 6/28 S/p left shoulder hemiarthroplasty by Dr. Cristy 6/29. S/p 1 unit of PRBC post surgery. Hb remained stable. Pain control with Morphine , oxy as needed. ID recommended IV daptomycin  700 mg daily with end date 12/27/23. PICC line placed 11/19/23. OPAT team on board. SNF placement per TOC, bed available today. She will need PCP, ID, Ortho follow up as suggested.   Community-acquired pneumonia  She did get broad spectrum antibiotics. Duoneb q6 hr PRN  Pulmonary toilet.   Elevated troponin/history of nonobstructive CAD Likely demand ischemia. No chest pain. Continue Lipitor 10 mg PO daily  Chronic HFpEF Continue Lasix  40 mg PO daily, metoprolol .   Chronic COPD/chronic hypoxic RF on 3 L at baseline Duoneb q6 hr PRN  Home dose Prednisone  10 mg  PO daily continued.   Essential hypertension Lopressor  50 mg PO bid    Chronic pain syndrome/neuropathy Continue Cymbalta  60 mg PO daily, MS Contin  15 mg PO q12, Oxycodone  PRN and Gabapentin  300 mg PO bid    IDDM-2 with hyperglycemia and level 1 hypoglycemia: A1c 6.0% in 09/2023. Sugars stable. Stopped accucheks, sliding scale.  Outpatient PCP follow up.   Rheumatoid arthritis On Prednisone  10 mg PO daily  Hold Arava  and Plaquenil  in the setting of active infection   Normocytic anemia Hb stable. Monitor H&H as outpatient.   History of PE Not on anticoagulation due to GI bleed.   CTA chest negative for PE. Eliquis  2.5 bid for post of DVT per ortho for 4 weeks. Follow H/H as outpatient, risk of bleeding explained.   History of sigmoid colon stricture s/p colectomy and colostomy creation February 2025 Continue colostomy care.   Hyperlipidemia Lipitor as above    Chronic constipation Continue MiraLAX  and senna, colace.   GERD Protonix  40 mg PO bid    Generalized anxiety disorder Cymbalta  as above    Hypothyroidism Synthroid  50 mcg PO daily    Class I obesity Body mass index is 34.08 kg/m. Diet, exercise and weight reduction advised.        Consultants: Orthopedic surgery, ID Procedures performed: S/p IR aspiration of LUE intramuscular abscess on 6/28, s/p left shoulder hemiarthroplasty by Dr. Cristy 6/29.  Disposition: Nursing home Diet recommendation:  Discharge Diet Orders (From admission, onward)     Start     Ordered   11/22/23 0000  Diet - low sodium heart healthy        11/22/23 1154           Cardiac diet DISCHARGE MEDICATION: Allergies as of 11/22/2023       Reactions   Codeine Swelling, Other (See Comments)   Facial and leg swelling Chest pain   Remicade [infliximab] Anaphylaxis   sent me into shock   Zestril [lisinopril] Swelling, Rash   Face and neck swelling   Ofev  [nintedanib] Diarrhea        Medication List     STOP  taking these medications    azithromycin  250 MG tablet Commonly known as: ZITHROMAX    hydroxychloroquine  200 MG tablet Commonly known as: PLAQUENIL    leflunomide  20 MG tablet Commonly known as: ARAVA    Magnesium  Oxide 250 MG Tabs       TAKE these medications    acetaminophen  500 MG tablet Commonly known as: TYLENOL  Take 1,000 mg by mouth every 8 (eight) hours as needed (pain).   albuterol  108 (90 Base) MCG/ACT inhaler Commonly known as: VENTOLIN  HFA Inhale 2 puffs into the lungs in the morning and at bedtime.   apixaban  2.5 MG Tabs tablet Commonly known as: ELIQUIS  Take 1 tablet (2.5 mg total) by mouth 2 (two) times daily. For postoperative DVT prophylaxis   atorvastatin  10 MG tablet Commonly known as: LIPITOR Take 1 tablet (10 mg total) by mouth daily.   bisacodyl  10 MG suppository Commonly known as: DULCOLAX Place 10 mg rectally daily as needed (bowel management).   daptomycin  IVPB Commonly known as: CUBICIN  Inject 700 mg into the vein daily. Indication:  MRSA Bacteremia/septic arthritis of shoulder First Dose: Yes Last Day of Therapy:  12/27/23 Labs - Once weekly:  CBC/D, BMP, and CPK  Labs - Once weekly: ESR and CRP   diclofenac  Sodium 1 % Gel Commonly known as: VOLTAREN  Apply 2 g topically 3 (three) times daily.   DULoxetine  60 MG capsule Commonly known as: CYMBALTA  Take 1 capsule (60 mg total) by mouth daily.   feeding supplement Liqd Take 237 mLs by mouth 2 (two) times daily between meals.   furosemide  40 MG tablet Commonly known as: LASIX  Take 1 tablet (40 mg total) by mouth daily.   gabapentin  300 MG capsule Commonly known as: NEURONTIN  Take 1 capsule (300 mg total) by mouth 2 (two) times daily. What changed: when to take this   guaiFENesin  600 MG 12 hr tablet Commonly known as: MUCINEX  Take 2 tablets (1,200 mg total) by mouth 2 (two) times daily.   hyoscyamine  0.125 MG SL tablet Commonly known as: LEVSIN  SL Place 1 tablet (0.125 mg  total) under the tongue every 8 (eight) hours as needed.   leptospermum manuka honey Pste paste Apply 1 Application topically daily. Interchangeable with TheraHoney Apply thin layer (3 mm) to wound.   levothyroxine  50 MCG tablet Commonly known as: SYNTHROID  Take 1 tablet (50 mcg total) by mouth daily.   lidocaine  5 % Commonly known as: LIDODERM  Place 1 patch onto the skin daily. Remove & Discard patch within 12 hours or as directed by MD   methocarbamol  500 MG tablet Commonly known as: ROBAXIN  Take 1 tablet (500 mg total) by mouth every 6 (six) hours as needed for muscle spasms (pain).   metoprolol  tartrate 25 MG tablet Commonly known as: LOPRESSOR  Take 2 tablets (50 mg total) by mouth 2 (two) times daily. What changed:  how much to take when to take this additional instructions Another medication with the same name was removed. Continue taking this medication, and follow the directions you see here.   morphine  15 MG 12 hr tablet Commonly known as: MS CONTIN  Take 1 tablet (15 mg total) by mouth every 12 (twelve) hours.   multivitamin tablet Take 1 tablet by mouth daily.   nitroGLYCERIN  0.4 MG SL tablet Commonly known as: NITROSTAT  Place 1 tablet (0.4 mg total) under the tongue every 5 (five) minutes as needed for chest pain.   oxyCODONE  5 MG immediate release tablet Commonly known as: Oxy IR/ROXICODONE  Take 1 pills every 6 hrs as needed for severe breakthrough pain   OXYGEN  Inhale 3 L/min into the lungs at bedtime. As needed during day time   pantoprazole  40 MG tablet Commonly known as: PROTONIX  Take 1 tablet (40 mg total) by mouth 2 (two) times daily.   potassium chloride  SA 20 MEQ tablet Commonly known as: KLOR-CON  M Take 2 tablets (40 mEq total) by mouth 2 (two) times daily. What changed:  how much to take when to take this   predniSONE  10 MG tablet Commonly known as: DELTASONE  Take 1 tablet (10 mg total) by mouth daily with breakfast.   Vitamin D3 50  MCG (2000 UT) Tabs Take 2,000 Units by mouth daily.               Home Infusion Instuctions  (From admission, onward)           Start     Ordered   11/22/23 0000  Home infusion instructions       Question:  Instructions  Answer:  Flushing of vascular access device: 0.9% NaCl pre/post medication administration and prn patency; Heparin  100 u/ml, 5ml for implanted ports and Heparin  10u/ml, 5ml for all other central venous catheters.  11/22/23 1154              Discharge Care Instructions  (From admission, onward)           Start     Ordered   11/22/23 0000  Discharge wound care:       Comments: Cleanse heel wound with saline, pat dry Cover with single layer of xeroform, top with foam.  Change every other day   11/22/23 1154            Contact information for follow-up providers     Mercer Clotilda SAUNDERS, MD Follow up in 1 week(s).   Specialty: Family Medicine Contact information: [address] [place] [address] [phone]         Fleeta Rothman, Jomarie SAILOR, MD. Call in 1 week(s).   Specialty: Infectious Diseases Why: 12/09/23 as scheduled Contact information: [address] [place] [address] [phone]         Cristy Bonner DASEN, MD. Call in 1 week(s).   Specialty: Orthopedic Surgery Contact information: [address] [place] [address] [phone]              Contact information for after-discharge care     Destination     [facility], [place] .   Service: Skilled Nursing Contact information: [address] [place]  [address] [phone]                    Discharge Exam: Filed Weights   11/13/23 1255 11/15/23 0720 11/22/23 0530  Weight: 90.3 kg 90.3 kg 90.1 kg      11/22/2023   11:29 AM 11/22/2023    7:09 AM 11/22/2023    5:30 AM  Vitals with BMI  Weight   198 lbs 10 oz  BMI   34.08  Systolic 157 137   Diastolic 76 76   Pulse 90 84      General - Elderly obese African [place] female, no apparent distress HEENT - PERRLA, EOMI, atraumatic head, non tender sinuses. Lung - Clear, basal rales, rhonchi, wheezes. Heart - S1, S2 heard, no murmurs, rubs, trace pedal edema. Abdomen - Soft, non tender, colostomy bag noted. Neuro - Alert, awake and oriented x 3, non focal exam. Skin - Warm and dry. Left shoulder dressing, sling noted.  Condition at discharge: stable  The results of significant diagnostics from this hospitalization (including imaging, microbiology, ancillary and laboratory) are listed below for reference.   Imaging Studies: US  EKG SITE RITE Result Date: 11/18/2023 If Site Rite image not attached, placement could not be confirmed due to current cardiac rhythm.  ECHOCARDIOGRAM COMPLETE Result Date: 11/16/2023    ECHOCARDIOGRAM REPORT   Patient Name:   LIVIANA MILLS Date of Exam: 11/16/2023 Medical Rec #:  [id number]         Height:       64.0 in Accession #:    [id number]        Weight:       199.0 lb Date of Birth:  16-Dec-1940         BSA:          1.952 m Patient Age:    82 years          BP:           178/115 mmHg Patient Gender: F                 HR:  85 bpm. Exam Location:  Inpatient Procedure: 2D Echo, Cardiac Doppler and Color Doppler (Both Spectral and Color            Flow Doppler were utilized during procedure). Indications:    Bacteremia  History:        Patient has prior history of Echocardiogram examinations, most                 recent 08/14/2023.  Sonographer:    Jayson Gaskins Referring Phys: [id number] TAYE T GONFA IMPRESSIONS  1. Left ventricular ejection fraction, by estimation, is 60 to 65%. The left ventricle has normal function. The left ventricle has no regional wall motion abnormalities. Left ventricular diastolic parameters are consistent with Grade I diastolic dysfunction (impaired relaxation).  2. Right ventricular systolic function is normal. The right ventricular size is normal.  3. The  mitral valve is normal in structure. No evidence of mitral valve regurgitation. No evidence of mitral stenosis.  4. The aortic valve is normal in structure. Aortic valve regurgitation is not visualized. No aortic stenosis is present.  5. The inferior vena cava is normal in size with greater than 50% respiratory variability, suggesting right atrial pressure of 3 mmHg. FINDINGS  Left Ventricle: Left ventricular ejection fraction, by estimation, is 60 to 65%. The left ventricle has normal function. The left ventricle has no regional wall motion abnormalities. The left ventricular internal cavity size was normal in size. There is  no left ventricular hypertrophy. Left ventricular diastolic parameters are consistent with Grade I diastolic dysfunction (impaired relaxation). Right Ventricle: The right ventricular size is normal. No increase in right ventricular wall thickness. Right ventricular systolic function is normal. Left Atrium: Left atrial size was normal in size. Right Atrium: Right atrial size was normal in size. Pericardium: There is no evidence of pericardial effusion. Mitral Valve: The mitral valve is normal in structure. No evidence of mitral valve regurgitation. No evidence of mitral valve stenosis. Tricuspid Valve: The tricuspid valve is grossly normal. Tricuspid valve regurgitation is not demonstrated. No evidence of tricuspid stenosis. Aortic Valve: The aortic valve is normal in structure. Aortic valve regurgitation is not visualized. No aortic stenosis is present. Aortic valve mean gradient measures 3.0 mmHg. Aortic valve peak gradient measures 4.0 mmHg. Aortic valve area, by VTI measures 2.29 cm. Pulmonic Valve: The pulmonic valve was not well visualized. Pulmonic valve regurgitation is not visualized. No evidence of pulmonic stenosis. Aorta: The aortic root is normal in size and structure. Venous: The inferior vena cava is normal in size with greater than 50% respiratory variability, suggesting right  atrial pressure of 3 mmHg. IAS/Shunts: No atrial level shunt detected by color flow Doppler.  LEFT VENTRICLE PLAX 2D LVIDd:         4.10 cm   Diastology LVIDs:         2.30 cm   LV e' medial:    4.90 cm/s LV PW:         0.80 cm   LV E/e' medial:  12.1 LV IVS:        0.90 cm   LV e' lateral:   6.09 cm/s LVOT diam:     1.80 cm   LV E/e' lateral: 9.7 LV SV:         46 LV SV Index:   23 LVOT Area:     2.54 cm  RIGHT VENTRICLE RV S prime:     18.50 cm/s TAPSE (M-mode): 2.6 cm LEFT ATRIUM  Index        RIGHT ATRIUM           Index LA Vol (A2C):   40.0 ml 20.49 ml/m  RA Area:     12.70 cm LA Vol (A4C):   31.8 ml 16.29 ml/m  RA Volume:   30.70 ml  15.73 ml/m LA Biplane Vol: 35.9 ml 18.39 ml/m  AORTIC VALVE AV Area (Vmax):    2.25 cm AV Area (Vmean):   2.19 cm AV Area (VTI):     2.29 cm AV Vmax:           100.00 cm/s AV Vmean:          76.800 cm/s AV VTI:            0.200 m AV Peak Grad:      4.0 mmHg AV Mean Grad:      3.0 mmHg LVOT Vmax:         88.60 cm/s LVOT Vmean:        66.100 cm/s LVOT VTI:          0.180 m LVOT/AV VTI ratio: 0.90  AORTA Ao Root diam: 3.00 cm MITRAL VALVE MV Area (PHT): 3.99 cm    SHUNTS MV Decel Time: 190 msec    Systemic VTI:  0.18 m MV E velocity: 59.20 cm/s  Systemic Diam: 1.80 cm MV A velocity: 97.50 cm/s MV E/A ratio:  0.61 Aditya Sabharwal Electronically signed by Ria Commander Signature Date/Time: 11/16/2023/5:03:59 PM    Final    DG Shoulder Left Port Result Date: 11/15/2023 CLINICAL DATA:  Postop check from hemiarthroplasty. EXAM: LEFT SHOULDER COMPARISON:  Chest x-ray 08/12/2023 FINDINGS: Evidence of patient's recent left shoulder arthroplasty which is intact. Surgical drain present over the surgical site. Remainder of the exam is unremarkable. IMPRESSION: Expected changes post left shoulder arthroplasty. Electronically Signed   By: Toribio Agreste M.D.   On: 11/15/2023 11:16   DG FLUORO GUIDED NEEDLE PLC ASPIRATION/INJECTION LOC Result Date: 11/15/2023 CLINICAL  DATA:  10220 Abscess 563 83 year old female with left shoulder pain. Previous imaging showed fluid collection in the left shoulder and left upper arm musculatures. Request for left shoulder aspiration. EXAM: FLUOROSCOPIC GUIDED LEFT SHOULDER ARTHROCENTESIS FLUOROSCOPY: Radiation Exposure Index and estimated peak skin dose (PSD); Reference air kerma (RAK), 1.9 mGy. Kerma-area product (KAP), 13.69 uGy*m. PROCEDURE: After a thorough discussion of risks and benefits of the procedure, written consent was obtained. The consent discussion included the risk of bleeding, infection, injury to nerves and adjacent blood vessels, low yield and allergic reaction. Preliminary localization was performed over the LEFT shoulder. The area was marked over superior medial anterior humeral head. After prep and drape in the usual sterile fashion, the skin and deeper subcutaneous tissues were anesthetized with 1% Lidocaine  without Epinephrine . Under fluoroscopic guidance, a 20 gauge 3.5 inch spinal needle was advanced into the joint over the superior medial anterior humeral head using an anterior approach. 2.5 mL of hazy yellow fluid was aspirated without difficulty. The fluid will be sent for laboratory. The needle removed, and a sterile dressing applied. The patient tolerated the procedure well and there were no complications. IMPRESSION: Successful fluoroscopically-guided LEFT shoulder arthrocentesis. Performed by: Aimee Han, PA-C Electronically Signed   By: Thom Hall M.D.   On: 11/15/2023 10:10   UE Venous Duplex ([facility] and [facility] ONLY) Result Date: 11/14/2023 UPPER VENOUS STUDY  Patient Name:  CASIA CORTI  Date of Exam:   11/13/2023 Medical Rec #: [id number]  Accession #:    [id number] Date of Birth: 1941/03/10          Patient Gender: F Patient Age:   68 years Exam Location:  [facility] Procedure:      VAS US  UPPER EXTREMITY VENOUS DUPLEX Referring Phys: ANDREW TEE  --------------------------------------------------------------------------------  Indications: Pain, and Swelling Performing Technologist: Jody Hill RVT, RDMS  Examination Guidelines: A complete evaluation includes B-mode imaging, spectral Doppler, color Doppler, and power Doppler as needed of all accessible portions of each vessel. Bilateral testing is considered an integral part of a complete examination. Limited examinations for reoccurring indications may be performed as noted.  Left Findings: +----------+------------+---------+-----------+----------+---------------------+ LEFT      CompressiblePhasicitySpontaneousProperties       Summary        +----------+------------+---------+-----------+----------+---------------------+ IJV           Full       Yes       Yes                                    +----------+------------+---------+-----------+----------+---------------------+ Subclavian    Full       Yes       Yes                                    +----------+------------+---------+-----------+----------+---------------------+ Axillary      Full       Yes       Yes                                    +----------+------------+---------+-----------+----------+---------------------+ Brachial                 Yes       Yes               not well visualized                                                            patent by                                                               color/doppler     +----------+------------+---------+-----------+----------+---------------------+ Radial        Full                                                        +----------+------------+---------+-----------+----------+---------------------+ Ulnar         Full                                   not well visualized  +----------+------------+---------+-----------+----------+---------------------+ Cephalic  Full                                                         +----------+------------+---------+-----------+----------+---------------------+ Basilic       Full       Yes       Yes                                    +----------+------------+---------+-----------+----------+---------------------+ Large non-vascularized heterogenous structure seen in left upper arm (11.5 x 4.1 x 5.10).  Summary:  Right: No evidence of thrombosis in the subclavian.  Left: No evidence of deep vein thrombosis in the upper extremity. No evidence of superficial vein thrombosis in the upper extremity. Large non-vascularized heterogenous structure seen in left upper arm (11.5 x 4.1 x 5.10).  *See table(s) above for measurements and observations.  Diagnosing physician: Norman Serve Electronically signed by Norman Serve on 11/14/2023 at 3:46:41 PM.    Final    US  FNA SOFT TISSUE Result Date: 11/14/2023 INDICATION: 10220 Abscess [id number] EXAM: US -GUIDED DIAGNOSTIC ASPIRATION OF A LEFT UPPER EXTREMITY SUBCUTANEOUS FLUID COLLECTION COMPARISON:  CTA LEFT UPPER EXTREMITY, 11/13/2023. MR SHOULDER, 11/14/2023 MEDICATIONS: The patient is currently admitted to the hospital and receiving intravenous antibiotics. The antibiotics were administered within an appropriate time frame prior to the initiation of the procedure. ANESTHESIA/SEDATION: Local anesthetic was administered. CONTRAST:  None COMPLICATIONS: None immediate. PROCEDURE: Informed written consent was obtained from the patient after a discussion of the risks, benefits and alternatives to treatment. Preprocedural ultrasound scanning demonstrated a large heterogeneously dense, anechoic subcutaneous fluid collection. A timeout was performed prior to the initiation of the procedure. The proximal LEFT upper extremity was prepped and draped in the usual sterile fashion. The overlying soft tissues were anesthetized with 1% lidocaine  with epinephrine . Under direct ultrasound guidance, a 18 gauge trocar needle was advanced into the abscess/fluid  collection. Multiple ultrasound images were saved for procedural documentation purposes. Next, approximately 45 mL of purulent fluid was aspirated from the collection. A representative sample of aspirated fluid was capped and sent to the laboratory for analysis. The needle was removed and superficial hemostasis was achieved with manual compression. A dressing was placed. The patient tolerated the procedure well without immediate postprocedural complication. IMPRESSION: Successful US  guided of 45 mL of purulent fluid from the LEFT upper extremity subcutaneous fluid collection. A representative aspirated sample was sent to the laboratory as requested by the ordering clinical team. Thom Hall, MD [facility] Radiology Electronically Signed   By: Thom Hall M.D.   On: 11/14/2023 15:37   MR SHOULDER LEFT W WO CONTRAST Result Date: 11/14/2023 CLINICAL DATA:  Concern for septic arthritis of the left shoulder. EXAM: MRI OF THE LEFT SHOULDER WITHOUT AND WITH CONTRAST TECHNIQUE: Multiplanar, multisequence MR imaging of the left shoulder was performed before and after the administration of intravenous contrast. CONTRAST:  9mL GADAVIST  GADOBUTROL  1 MMOL/ML IV SOLN COMPARISON:  CTA of the left upper extremity dated 11/13/2023. FINDINGS: Glenohumeral Joint: Large complex glenohumeral joint effusion measuring approximately 8.6 cm AP by 4.5 cm TR with synovitis and thickened peripheral enhancement, concerning for septic arthritis. This complex fluid likely communicates with and decompresses into the biceps tendon sheath which demonstrates complex fluid distension and  surrounding thick peripheral enhancement, concerning for infectious tenosynovitis, which extends distally beyond the field of view with surrounding intramuscular edema which could reflect myositis (series 3, image 18 and series 9, image 18). There is surrounding soft tissue edema and enhancement, concerning for  cellulitis. Glenohumeral joint remains aligned. Rotator cuff: Moderate to severe fatty atrophy of the supraspinatus and infraspinatus muscles and to a lesser degree the subscapularis muscle. High-grade to near complete retracted insertional tears of the supraspinatus and subscapularis tendons. Infraspinatus tendinosis with partial-thickness articular sided insertional tear. Teres minor tendon is intact. Biceps Long Head: Intraarticular biceps tendon is not clearly identified and likely torn and retracted. Acromioclavicular Joint: Mild-to-moderate arthropathy of the acromioclavicular joint. Fluid within the subacromial/subdeltoid bursa, likely secondary to glenohumeral effusion extending through full-thickness rotator cuff tears. Labrum: Diffuse labral degeneration with tearing. Bones: Large complex glenohumeral joint effusion, as described above, with periarticular edema and enhancement of the glenoid and humeral head, concerning for septic arthritis/osteomyelitis. No definite acute fracture identified. IMPRESSION: 1. Large complex glenohumeral joint effusion with synovitis, thickened peripheral enhancement, and periarticular edema/enhancement of the glenoid and humeral head. These findings are concerning for septic arthritis/osteomyelitis. Edema and enhancement of the surrounding rotator cuff musculature is suspicious for myositis. The complex glenohumeral effusion likely communicates with and decompresses into the biceps tendon sheath which demonstrates complex fluid distension with surrounding thick peripheral enhancement, concerning for infectious tenosynovitis extending distally beyond the field of view with surrounding intramuscular edema of the biceps, concerning for myositis. 2. High-grade to near complete retracted insertional tears of the supraspinatus and subscapularis tendons. Infraspinatus tendinosis with partial-thickness articular sided insertional tear. Moderate to severe fatty atrophy of the  supraspinatus and infraspinatus muscles and to a lesser degree the subscapularis muscle. 3. Intra-articular biceps tendon is not clearly identified and likely torn and retracted. 4. Diffuse labral degeneration with tearing. 5. Moderate glenohumeral joint arthritis. Electronically Signed   By: Harrietta Sherry M.D.   On: 11/14/2023 13:37   CT ANGIO UP EXTREM LEFT W &/OR WO CONTAST Result Date: 11/13/2023 CLINICAL DATA:  Abnormal ultrasound, blood clot in arm EXAM: CT ANGIOGRAPHY OF LEFT UPPER EXTREMITY TECHNIQUE: Multidetector CT imaging of the LEFT UPPER EXTREMITY was performed using the standard protocol during bolus administration of intravenous contrast. Multiplanar CT image reconstructions and MIPs were obtained to evaluate the vascular anatomy. RADIATION DOSE REDUCTION: This exam was performed according to the departmental dose-optimization program which includes automated exposure control, adjustment of the mA and/or kV according to patient size and/or use of iterative reconstruction technique. CONTRAST:  OMNIPAQUE  IOHEXOL  350 MG/ML SOLN COMPARISON:  None Available. FINDINGS: VASCULAR Subclavian artery: Patent where visualized. No acute luminal abnormality. No aneurysm or ectasia. Axillary artery: Widely patent. No acute luminal abnormality. No aneurysm or ectasia. Brachial artery: Widely patent. No acute luminal abnormality. No aneurysm or ectasia. Radial and ulnar arteries: Not well visualized Veins: Not well opacified. Nonvascular: Nondisplaced fracture of the posterosuperior glenoid large loculated peripherally enhancing fluid collection extending from the left glenohumeral joint into the proximal and mid biceps musculature. This measures approximately 5.3 x 3.6 x 17.5 cm. Adjacent intramuscular and subcutaneous edema throughout the left arm. There is loss of cortical definition about the portions of the superior and posterior humeral head (circa series 8/image 82 and 6/37). IMPRESSION: 1.  Nondisplaced fracture of the posterosuperior glenoid. 2. Large loculated peripherally enhancing fluid collection extending from the left glenohumeral joint into the proximal and mid biceps musculature. This measures approximately 5.3 x 3.6 x 17.5 cm.  Findings are concerning for septic arthritis and intramuscular abscess. 3. Question developing humeral head osteomyelitis. 4. The radial and ulnar arteries are not well visualized. The subclavian, axillary, and brachial arteries are widely patent. Electronically Signed   By: Norman Gatlin M.D.   On: 11/13/2023 20:09   CT Angio Chest PE W and/or Wo Contrast Result Date: 11/13/2023 CLINICAL DATA:  Blood clot in arm, arm pain. Evaluate for pulmonary embolism. EXAM: CT ANGIOGRAPHY CHEST WITH CONTRAST TECHNIQUE: Multidetector CT imaging of the chest was performed using the standard protocol during bolus administration of intravenous contrast. Multiplanar CT image reconstructions and MIPs were obtained to evaluate the vascular anatomy. RADIATION DOSE REDUCTION: This exam was performed according to the departmental dose-optimization program which includes automated exposure control, adjustment of the mA and/or kV according to patient size and/or use of iterative reconstruction technique. CONTRAST:  OMNIPAQUE  IOHEXOL  350 MG/ML SOLN COMPARISON:  CT chest 08/13/2023 FINDINGS: Cardiovascular: Negative for pulmonary embolism. Coronary artery and aortic atherosclerotic calcification. Normal caliber thoracic aorta. No pericardial effusion. Mediastinum/Nodes: Trachea and esophagus are unremarkable. No thoracic adenopathy. Lungs/Pleura: Patchy bilateral ground-glass opacities. Bronchial wall thickening and mucous plugging in the lower lobes. Left greater than right lower lobe atelectasis or pneumonia. Trace left pleural effusion. No pneumothorax. Upper Abdomen: No acute abnormality. Musculoskeletal: Suspected nondisplaced fracture of the superior left glenoid fossa (series  9/image 67). Peripherally enhancing left glenohumeral joint effusion. If there is concern for septic arthritis fluid sampling is recommended. Review of the MIP images confirms the above findings. IMPRESSION: 1. Negative for pulmonary embolism. 2. Patchy bilateral ground-glass opacities with bronchial wall thickening and mucous plugging in the lower lobes. Findings are concerning for pneumonia. 3. Nondisplaced fracture of the superior left glenoid fossa. Peripherally enhancing left glenohumeral joint effusion. Septic arthritis is not excluded. Consider joint aspiration. 4. Aortic Atherosclerosis (ICD10-I70.0). Electronically Signed   By: Norman Gatlin M.D.   On: 11/13/2023 19:57    Microbiology: Results for orders placed or performed during the hospital encounter of 11/13/23  Body fluid culture w Gram Stain     Status: None   Collection Time: 11/13/23  9:22 PM   Specimen: Abscess; Body Fluid  Result Value Ref Range Status   Specimen Description ABSCESS  Final   Special Requests JOINT,LEFT SHOULDER  Final   Gram Stain   Final    MODERATE WBC PRESENT,BOTH PMN AND MONONUCLEAR RARE GRAM POSITIVE COCCI Performed at [facility], [address] [place], [place] [address]    Culture   Final    MODERATE METHICILLIN RESISTANT STAPHYLOCOCCUS AUREUS   Report Status 11/16/2023 FINAL  Final   Organism ID, Bacteria METHICILLIN RESISTANT STAPHYLOCOCCUS AUREUS  Final      Susceptibility   Methicillin resistant staphylococcus aureus - MIC*    CIPROFLOXACIN >=8 RESISTANT Resistant     ERYTHROMYCIN >=8 RESISTANT Resistant     GENTAMICIN <=0.5 SENSITIVE Sensitive     OXACILLIN >=4 RESISTANT Resistant     TETRACYCLINE >=16 RESISTANT Resistant     VANCOMYCIN  1 SENSITIVE Sensitive     TRIMETH /SULFA  <=10 SENSITIVE Sensitive     CLINDAMYCIN >=8 RESISTANT Resistant     RIFAMPIN <=0.5 SENSITIVE Sensitive     Inducible Clindamycin NEGATIVE Sensitive     LINEZOLID 2 SENSITIVE Sensitive     * MODERATE  METHICILLIN RESISTANT STAPHYLOCOCCUS AUREUS  Anaerobic culture w Gram Stain     Status: None   Collection Time: 11/13/23  9:22 PM   Specimen: Abscess  Result Value  Ref Range Status   Specimen Description ABSCESS  Final   Special Requests JOINT,LEFT SHOULDER  Final   Gram Stain   Final    MODERATE WBC PRESENT,BOTH PMN AND MONONUCLEAR RARE GRAM POSITIVE COCCI    Culture   Final    NO ANAEROBES ISOLATED Performed at [facility], [address], [place], [place] [address]    Report Status 11/19/2023 FINAL  Final  Culture, blood (Routine X 2) w Reflex to ID Panel     Status: Abnormal (Preliminary result)   Collection Time: 11/13/23 11:31 PM   Specimen: BLOOD RIGHT HAND  Result Value Ref Range Status   Specimen Description BLOOD RIGHT HAND  Final   Special Requests   Final    BOTTLES DRAWN AEROBIC ONLY Blood Culture results may not be optimal due to an inadequate volume of blood received in culture bottles   Culture  Setup Time   Final    GRAM POSITIVE COCCI AEROBIC BOTTLE ONLY CRITICAL RESULT CALLED TO, READ BACK BY AND VERIFIED WITH: PHARMD S WISE 11/14/2023 @ 2132 BY AB    Culture (A)  Final    METHICILLIN RESISTANT STAPHYLOCOCCUS AUREUS Sent to [facility] for further susceptibility testing. Performed at [facility], [address] [place], [place] [address]    Report Status PENDING  Incomplete   Organism ID, Bacteria METHICILLIN RESISTANT STAPHYLOCOCCUS AUREUS  Final      Susceptibility   Methicillin resistant staphylococcus aureus - MIC*    CIPROFLOXACIN >=8 RESISTANT Resistant     ERYTHROMYCIN >=8 RESISTANT Resistant     GENTAMICIN <=0.5 SENSITIVE Sensitive     OXACILLIN >=4 RESISTANT Resistant     TETRACYCLINE >=16 RESISTANT Resistant     VANCOMYCIN  1 SENSITIVE Sensitive     TRIMETH /SULFA  <=10 SENSITIVE Sensitive     CLINDAMYCIN >=8 RESISTANT Resistant     RIFAMPIN <=0.5 SENSITIVE Sensitive     Inducible Clindamycin NEGATIVE Sensitive     LINEZOLID 2  SENSITIVE Sensitive     * METHICILLIN RESISTANT STAPHYLOCOCCUS AUREUS  Blood Culture ID Panel (Reflexed)     Status: Abnormal   Collection Time: 11/13/23 11:31 PM  Result Value Ref Range Status   Enterococcus faecalis NOT DETECTED NOT DETECTED Final   Enterococcus Faecium NOT DETECTED NOT DETECTED Final   Listeria monocytogenes NOT DETECTED NOT DETECTED Final   Staphylococcus species DETECTED (A) NOT DETECTED Final    Comment: CRITICAL RESULT CALLED TO, READ BACK BY AND VERIFIED WITH: PHARMD S WISE 11/14/2023 @ 2132 BY AB    Staphylococcus aureus (BCID) DETECTED (A) NOT DETECTED Final    Comment: Methicillin (oxacillin)-resistant Staphylococcus aureus (MRSA). MRSA is predictably resistant to beta-lactam antibiotics (except ceftaroline). Preferred therapy is vancomycin  unless clinically contraindicated. Patient requires contact precautions if  hospitalized. CRITICAL RESULT CALLED TO, READ BACK BY AND VERIFIED WITH: PHARMD S WISE 11/14/2023 @ 2132 BY AB    Staphylococcus epidermidis NOT DETECTED NOT DETECTED Final   Staphylococcus lugdunensis NOT DETECTED NOT DETECTED Final   Streptococcus species NOT DETECTED NOT DETECTED Final   Streptococcus agalactiae NOT DETECTED NOT DETECTED Final   Streptococcus pneumoniae NOT DETECTED NOT DETECTED Final   Streptococcus pyogenes NOT DETECTED NOT DETECTED Final   A.calcoaceticus-baumannii NOT DETECTED NOT DETECTED Final   Bacteroides fragilis NOT DETECTED NOT DETECTED Final   Enterobacterales NOT DETECTED NOT DETECTED Final   Enterobacter cloacae complex NOT DETECTED NOT DETECTED Final   Escherichia coli NOT DETECTED NOT DETECTED Final   Klebsiella aerogenes NOT  DETECTED NOT DETECTED Final   Klebsiella oxytoca NOT DETECTED NOT DETECTED Final   Klebsiella pneumoniae NOT DETECTED NOT DETECTED Final   Proteus species NOT DETECTED NOT DETECTED Final   Salmonella species NOT DETECTED NOT DETECTED Final   Serratia marcescens NOT DETECTED NOT DETECTED  Final   Haemophilus influenzae NOT DETECTED NOT DETECTED Final   Neisseria meningitidis NOT DETECTED NOT DETECTED Final   Pseudomonas aeruginosa NOT DETECTED NOT DETECTED Final   Stenotrophomonas maltophilia NOT DETECTED NOT DETECTED Final   Candida albicans NOT DETECTED NOT DETECTED Final   Candida auris NOT DETECTED NOT DETECTED Final   Candida glabrata NOT DETECTED NOT DETECTED Final   Candida krusei NOT DETECTED NOT DETECTED Final   Candida parapsilosis NOT DETECTED NOT DETECTED Final   Candida tropicalis NOT DETECTED NOT DETECTED Final   Cryptococcus neoformans/gattii NOT DETECTED NOT DETECTED Final   Meth resistant mecA/C and MREJ DETECTED (A) NOT DETECTED Final    Comment: CRITICAL RESULT CALLED TO, READ BACK BY AND VERIFIED WITH: PHARMD S WISE 11/14/2023 @ 2132 BY AB Performed at [facility], [address] [place], [place] [address]   Culture, blood (Routine X 2) w Reflex to ID Panel     Status: Abnormal   Collection Time: 11/13/23 11:48 PM   Specimen: BLOOD RIGHT FOREARM  Result Value Ref Range Status   Specimen Description BLOOD RIGHT FOREARM  Final   Special Requests   Final    BOTTLES DRAWN AEROBIC ONLY Blood Culture results may not be optimal due to an inadequate volume of blood received in culture bottles   Culture  Setup Time   Final    GRAM POSITIVE COCCI IN CLUSTERS AEROBIC BOTTLE ONLY CRITICAL VALUE NOTED.  VALUE IS CONSISTENT WITH PREVIOUSLY REPORTED AND CALLED VALUE.    Culture (A)  Final    STAPHYLOCOCCUS AUREUS SUSCEPTIBILITIES PERFORMED ON PREVIOUS CULTURE WITHIN THE LAST 5 DAYS. Performed at [facility], [address] [place], [place] [address]    Report Status 11/16/2023 FINAL  Final  Respiratory (~20 pathogens) panel by PCR     Status: None   Collection Time: 11/14/23  5:12 AM   Specimen: Nasopharyngeal Swab; Respiratory  Result Value Ref Range Status   Adenovirus NOT DETECTED NOT DETECTED Final   Coronavirus 229E NOT DETECTED NOT  DETECTED Final    Comment: (NOTE) The Coronavirus on the Respiratory Panel, DOES NOT test for the novel  Coronavirus (2019 nCoV)    Coronavirus HKU1 NOT DETECTED NOT DETECTED Final   Coronavirus NL63 NOT DETECTED NOT DETECTED Final   Coronavirus OC43 NOT DETECTED NOT DETECTED Final   Metapneumovirus NOT DETECTED NOT DETECTED Final   Rhinovirus / Enterovirus NOT DETECTED NOT DETECTED Final   Influenza A NOT DETECTED NOT DETECTED Final   Influenza B NOT DETECTED NOT DETECTED Final   Parainfluenza Virus 1 NOT DETECTED NOT DETECTED Final   Parainfluenza Virus 2 NOT DETECTED NOT DETECTED Final   Parainfluenza Virus 3 NOT DETECTED NOT DETECTED Final   Parainfluenza Virus 4 NOT DETECTED NOT DETECTED Final   Respiratory Syncytial Virus NOT DETECTED NOT DETECTED Final   Bordetella pertussis NOT DETECTED NOT DETECTED Final   Bordetella Parapertussis NOT DETECTED NOT DETECTED Final   Chlamydophila pneumoniae NOT DETECTED NOT DETECTED Final   Mycoplasma pneumoniae NOT DETECTED NOT DETECTED Final    Comment: Performed at [facility], [address], [place], [place] [address]  Aerobic/Anaerobic Culture w Gram Stain (surgical/deep wound)  Status: None   Collection Time: 11/14/23 11:05 AM   Specimen: Abscess  Result Value Ref Range Status   Specimen Description ABSCESS  Final   Special Requests ABSCESS  Final   Gram Stain   Final    ABUNDANT WBC PRESENT,BOTH PMN AND MONONUCLEAR RARE GRAM POSITIVE COCCI    Culture   Final    MODERATE METHICILLIN RESISTANT STAPHYLOCOCCUS AUREUS NO ANAEROBES ISOLATED Performed at [facility], [address], [place], [place] [address]    Report Status 11/19/2023 FINAL  Final   Organism ID, Bacteria METHICILLIN RESISTANT STAPHYLOCOCCUS AUREUS  Final      Susceptibility   Methicillin resistant staphylococcus aureus - MIC*    CIPROFLOXACIN >=8 RESISTANT Resistant     ERYTHROMYCIN >=8 RESISTANT Resistant     GENTAMICIN <=0.5 SENSITIVE Sensitive      OXACILLIN >=4 RESISTANT Resistant     TETRACYCLINE >=16 RESISTANT Resistant     VANCOMYCIN  1 SENSITIVE Sensitive     TRIMETH /SULFA  <=10 SENSITIVE Sensitive     CLINDAMYCIN >=8 RESISTANT Resistant     RIFAMPIN <=0.5 SENSITIVE Sensitive     Inducible Clindamycin NEGATIVE Sensitive     LINEZOLID 2 SENSITIVE Sensitive     * MODERATE METHICILLIN RESISTANT STAPHYLOCOCCUS AUREUS  MRSA Next Gen by PCR, Nasal     Status: Abnormal   Collection Time: 11/15/23  6:33 AM   Specimen: Nasal Mucosa; Nasal Swab  Result Value Ref Range Status   MRSA by PCR Next Gen DETECTED (A) NOT DETECTED Final    Comment: RESULT CALLED TO, READ BACK BY AND VERIFIED WITH: MERILEE HUA, RN  [phone] BY JINNY COMMON, [place] (NOTE) The GeneXpert MRSA Assay (FDA approved for NASAL specimens only), is one component of a comprehensive MRSA colonization surveillance program. It is not intended to diagnose MRSA infection nor to guide or monitor treatment for MRSA infections. Test performance is not FDA approved in patients less than 19 years old. Performed at [facility], [address], [place], [place] [address]   Aerobic/Anaerobic Culture w Gram Stain (surgical/deep wound)     Status: None   Collection Time: 11/15/23  9:27 AM   Specimen: Path Tissue  Result Value Ref Range Status   Specimen Description TISSUE  Final   Special Requests NONE  Final   Gram Stain NO WBC SEEN NO ORGANISMS SEEN   Final   Culture   Final    FEW METHICILLIN RESISTANT STAPHYLOCOCCUS AUREUS NO ANAEROBES ISOLATED Performed at [facility], [address], [place], [place] [address]    Report Status 11/20/2023 FINAL  Final   Organism ID, Bacteria METHICILLIN RESISTANT STAPHYLOCOCCUS AUREUS  Final      Susceptibility   Methicillin resistant staphylococcus aureus - MIC*    CIPROFLOXACIN >=8 RESISTANT Resistant     ERYTHROMYCIN >=8 RESISTANT Resistant     GENTAMICIN <=0.5 SENSITIVE Sensitive     OXACILLIN >=4 RESISTANT  Resistant     TETRACYCLINE >=16 RESISTANT Resistant     VANCOMYCIN  1 SENSITIVE Sensitive     TRIMETH /SULFA  <=10 SENSITIVE Sensitive     CLINDAMYCIN >=8 RESISTANT Resistant     RIFAMPIN <=0.5 SENSITIVE Sensitive     Inducible Clindamycin NEGATIVE Sensitive     LINEZOLID 2 SENSITIVE Sensitive     * FEW METHICILLIN RESISTANT STAPHYLOCOCCUS AUREUS  Aerobic/Anaerobic Culture w Gram Stain (surgical/deep wound)     Status: None   Collection Time: 11/15/23  9:28 AM   Specimen: Path Tissue  Result Value Ref Range Status   Specimen Description TISSUE  Final   Special Requests NONE  Final   Gram Stain NO WBC SEEN NO ORGANISMS SEEN   Final   Culture   Final    FEW METHICILLIN RESISTANT STAPHYLOCOCCUS AUREUS NO ANAEROBES ISOLATED Performed at [facility], [address], [place], [place] [address]    Report Status 11/20/2023 FINAL  Final   Organism ID, Bacteria METHICILLIN RESISTANT STAPHYLOCOCCUS AUREUS  Final      Susceptibility   Methicillin resistant staphylococcus aureus - MIC*    CIPROFLOXACIN >=8 RESISTANT Resistant     ERYTHROMYCIN >=8 RESISTANT Resistant     GENTAMICIN <=0.5 SENSITIVE Sensitive     OXACILLIN >=4 RESISTANT Resistant     TETRACYCLINE >=16 RESISTANT Resistant     VANCOMYCIN  1 SENSITIVE Sensitive     TRIMETH /SULFA  <=10 SENSITIVE Sensitive     CLINDAMYCIN >=8 RESISTANT Resistant     RIFAMPIN <=0.5 SENSITIVE Sensitive     Inducible Clindamycin NEGATIVE Sensitive     LINEZOLID 2 SENSITIVE Sensitive     * FEW METHICILLIN RESISTANT STAPHYLOCOCCUS AUREUS  Culture, blood (Routine X 2) w Reflex to ID Panel     Status: None   Collection Time: 11/15/23  1:58 PM   Specimen: BLOOD RIGHT HAND  Result Value Ref Range Status   Specimen Description BLOOD RIGHT HAND  Final   Special Requests   Final    BOTTLES DRAWN AEROBIC AND ANAEROBIC Blood Culture adequate volume   Culture   Final    NO GROWTH 5 DAYS Performed at [facility], [address], [place],  [place] [address]    Report Status 11/20/2023 FINAL  Final  Culture, blood (Routine X 2) w Reflex to ID Panel     Status: None   Collection Time: 11/15/23  2:54 PM   Specimen: BLOOD RIGHT ARM  Result Value Ref Range Status   Specimen Description BLOOD RIGHT ARM  Final   Special Requests   Final    BOTTLES DRAWN AEROBIC AND ANAEROBIC Blood Culture results may not be optimal due to an inadequate volume of blood received in culture bottles   Culture   Final    NO GROWTH 5 DAYS Performed at [facility], [address], [place], [place] [address]    Report Status 11/20/2023 FINAL  Final  MIC (1 Drug)-blood culure; 11/13/2023; BLOOD; MRSA; Daptomycin      Status: Abnormal   Collection Time: 11/16/23  9:10 AM   Specimen: BLOOD  Result Value Ref Range Status   Min Inhibitory Conc (1 Drug) Final report (A)  Corrected    Comment: (NOTE) Performed At: [facility] [address] [place], [place] [id number] Jennette Shorter MD Ey:[phone] CORRECTED ON 07/03 AT 1436: PREVIOUSLY REPORTED AS Preliminary report    Source DAPTOMYCIN  MRSA  Final    Comment: Performed at [facility], [address] [place], [place] [address]  MIC Result     Status: Abnormal   Collection Time: 11/16/23  9:10 AM  Result Value Ref Range Status   Result 1 (MIC) Comment (A)  Final    Comment: (NOTE) Methicillin - resistant Staphylococcus aureus Identification performed by account, not confirmed by this laboratory. Testing performed by broth microdilution. DAPTOMYCIN   0.5 ug/mL  SUSCEPTIBLE Performed At: [facility] [address] [place], [place] [id number] Jennette Shorter MD Ey:[phone]    *Note: Due to a large number of results and/or encounters for the  requested time period, some results have not been displayed. A complete set of results can be found in Results Review.    Labs: CBC: Recent Labs  Lab 11/16/23 0221 11/16/23 0226 11/16/23 1457 11/17/23 0751 11/18/23 0451  WBC  --   6.6 8.7 7.9 7.5  HGB 6.8* 6.9* 8.9* 8.4* 8.6*  HCT 23.0* 23.1* 28.6* 27.2* 27.8*  MCV  --  93.5 93.2 92.8 95.2  PLT  --  149* 180 194 225   Basic Metabolic Panel: Recent Labs  Lab 11/16/23 0226 11/17/23 0751 11/18/23 0451  NA 139 139 139  K 4.5 3.4* 3.3*  CL 103 103 102  CO2 27 26 28   GLUCOSE 107* 89 84  BUN 13 13 12   CREATININE 0.95 0.86 0.75  CALCIUM  7.6* 7.8* 7.6*  MG 2.0 1.9 1.9  PHOS 3.5 2.2* 2.4*   Liver Function Tests: Recent Labs  Lab 11/16/23 0226 11/17/23 0751 11/18/23 0451  AST  --   --  17  ALT  --   --  15  ALKPHOS  --   --  126  BILITOT  --   --  0.8  PROT  --   --  4.9*  ALBUMIN  1.8* 1.8* 1.9*   CBG: Recent Labs  Lab 11/21/23 1110 11/21/23 1626 11/21/23 2212 11/22/23 0622 11/22/23 1133  GLUCAP 94 233* 109* 107* 84    Discharge time spent: 39 minutes.  Signed: Concepcion Riser, MD [facility] 11/22/2023

## 2023-11-23 ENCOUNTER — Ambulatory Visit (INDEPENDENT_AMBULATORY_CARE_PROVIDER_SITE_OTHER)

## 2023-11-23 DIAGNOSIS — E785 Hyperlipidemia, unspecified: Secondary | ICD-10-CM | POA: Diagnosis not present

## 2023-11-23 DIAGNOSIS — D649 Anemia, unspecified: Secondary | ICD-10-CM | POA: Diagnosis not present

## 2023-11-23 DIAGNOSIS — E039 Hypothyroidism, unspecified: Secondary | ICD-10-CM | POA: Diagnosis not present

## 2023-11-23 DIAGNOSIS — F411 Generalized anxiety disorder: Secondary | ICD-10-CM | POA: Diagnosis not present

## 2023-11-23 DIAGNOSIS — K219 Gastro-esophageal reflux disease without esophagitis: Secondary | ICD-10-CM | POA: Diagnosis not present

## 2023-11-23 DIAGNOSIS — G629 Polyneuropathy, unspecified: Secondary | ICD-10-CM | POA: Diagnosis not present

## 2023-11-23 DIAGNOSIS — R1032 Left lower quadrant pain: Secondary | ICD-10-CM | POA: Diagnosis not present

## 2023-11-23 DIAGNOSIS — I509 Heart failure, unspecified: Secondary | ICD-10-CM | POA: Diagnosis not present

## 2023-11-23 LAB — HEMOCCULT SLIDES (X 3 CARDS)
Fecal Occult Blood: NEGATIVE
OCCULT 1: NEGATIVE
OCCULT 2: NEGATIVE
OCCULT 3: NEGATIVE
OCCULT 4: NEGATIVE
OCCULT 5: NEGATIVE

## 2023-11-24 DIAGNOSIS — L89623 Pressure ulcer of left heel, stage 3: Secondary | ICD-10-CM | POA: Diagnosis not present

## 2023-11-24 DIAGNOSIS — L89153 Pressure ulcer of sacral region, stage 3: Secondary | ICD-10-CM | POA: Diagnosis not present

## 2023-11-24 LAB — CULTURE, BLOOD (ROUTINE X 2)

## 2023-11-25 DIAGNOSIS — Z96612 Presence of left artificial shoulder joint: Secondary | ICD-10-CM | POA: Diagnosis not present

## 2023-11-25 DIAGNOSIS — Z741 Need for assistance with personal care: Secondary | ICD-10-CM | POA: Diagnosis not present

## 2023-11-25 DIAGNOSIS — Z471 Aftercare following joint replacement surgery: Secondary | ICD-10-CM | POA: Diagnosis not present

## 2023-11-25 DIAGNOSIS — F419 Anxiety disorder, unspecified: Secondary | ICD-10-CM | POA: Diagnosis not present

## 2023-11-25 DIAGNOSIS — D849 Immunodeficiency, unspecified: Secondary | ICD-10-CM | POA: Diagnosis not present

## 2023-11-25 DIAGNOSIS — I5032 Chronic diastolic (congestive) heart failure: Secondary | ICD-10-CM | POA: Diagnosis not present

## 2023-11-25 DIAGNOSIS — R2689 Other abnormalities of gait and mobility: Secondary | ICD-10-CM | POA: Diagnosis not present

## 2023-11-25 DIAGNOSIS — J449 Chronic obstructive pulmonary disease, unspecified: Secondary | ICD-10-CM | POA: Diagnosis not present

## 2023-11-25 DIAGNOSIS — M069 Rheumatoid arthritis, unspecified: Secondary | ICD-10-CM | POA: Diagnosis not present

## 2023-11-25 DIAGNOSIS — M6281 Muscle weakness (generalized): Secondary | ICD-10-CM | POA: Diagnosis not present

## 2023-11-25 DIAGNOSIS — J9611 Chronic respiratory failure with hypoxia: Secondary | ICD-10-CM | POA: Diagnosis not present

## 2023-11-25 DIAGNOSIS — E46 Unspecified protein-calorie malnutrition: Secondary | ICD-10-CM | POA: Diagnosis not present

## 2023-11-25 DIAGNOSIS — M6259 Muscle wasting and atrophy, not elsewhere classified, multiple sites: Secondary | ICD-10-CM | POA: Diagnosis not present

## 2023-11-27 DIAGNOSIS — M25512 Pain in left shoulder: Secondary | ICD-10-CM | POA: Diagnosis not present

## 2023-11-27 DIAGNOSIS — M25511 Pain in right shoulder: Secondary | ICD-10-CM | POA: Diagnosis not present

## 2023-11-27 DIAGNOSIS — Z471 Aftercare following joint replacement surgery: Secondary | ICD-10-CM | POA: Diagnosis not present

## 2023-12-01 DIAGNOSIS — L89623 Pressure ulcer of left heel, stage 3: Secondary | ICD-10-CM | POA: Diagnosis not present

## 2023-12-01 DIAGNOSIS — L89153 Pressure ulcer of sacral region, stage 3: Secondary | ICD-10-CM | POA: Diagnosis not present

## 2023-12-02 DIAGNOSIS — D849 Immunodeficiency, unspecified: Secondary | ICD-10-CM | POA: Diagnosis not present

## 2023-12-02 DIAGNOSIS — Z741 Need for assistance with personal care: Secondary | ICD-10-CM | POA: Diagnosis not present

## 2023-12-02 DIAGNOSIS — E46 Unspecified protein-calorie malnutrition: Secondary | ICD-10-CM | POA: Diagnosis not present

## 2023-12-02 DIAGNOSIS — J449 Chronic obstructive pulmonary disease, unspecified: Secondary | ICD-10-CM | POA: Diagnosis not present

## 2023-12-02 DIAGNOSIS — M069 Rheumatoid arthritis, unspecified: Secondary | ICD-10-CM | POA: Diagnosis not present

## 2023-12-02 DIAGNOSIS — Z471 Aftercare following joint replacement surgery: Secondary | ICD-10-CM | POA: Diagnosis not present

## 2023-12-02 DIAGNOSIS — M6281 Muscle weakness (generalized): Secondary | ICD-10-CM | POA: Diagnosis not present

## 2023-12-02 DIAGNOSIS — J9611 Chronic respiratory failure with hypoxia: Secondary | ICD-10-CM | POA: Diagnosis not present

## 2023-12-02 DIAGNOSIS — I5032 Chronic diastolic (congestive) heart failure: Secondary | ICD-10-CM | POA: Diagnosis not present

## 2023-12-02 DIAGNOSIS — M6259 Muscle wasting and atrophy, not elsewhere classified, multiple sites: Secondary | ICD-10-CM | POA: Diagnosis not present

## 2023-12-02 DIAGNOSIS — F419 Anxiety disorder, unspecified: Secondary | ICD-10-CM | POA: Diagnosis not present

## 2023-12-02 DIAGNOSIS — R2689 Other abnormalities of gait and mobility: Secondary | ICD-10-CM | POA: Diagnosis not present

## 2023-12-03 ENCOUNTER — Inpatient Hospital Stay: Admitting: Family Medicine

## 2023-12-04 DIAGNOSIS — R634 Abnormal weight loss: Secondary | ICD-10-CM | POA: Diagnosis not present

## 2023-12-04 DIAGNOSIS — F4323 Adjustment disorder with mixed anxiety and depressed mood: Secondary | ICD-10-CM | POA: Diagnosis not present

## 2023-12-04 DIAGNOSIS — F419 Anxiety disorder, unspecified: Secondary | ICD-10-CM | POA: Diagnosis not present

## 2023-12-08 ENCOUNTER — Encounter: Payer: Self-pay | Admitting: Infectious Disease

## 2023-12-08 DIAGNOSIS — M25512 Pain in left shoulder: Secondary | ICD-10-CM | POA: Diagnosis not present

## 2023-12-08 DIAGNOSIS — L89623 Pressure ulcer of left heel, stage 3: Secondary | ICD-10-CM | POA: Diagnosis not present

## 2023-12-08 DIAGNOSIS — T847XXA Infection and inflammatory reaction due to other internal orthopedic prosthetic devices, implants and grafts, initial encounter: Secondary | ICD-10-CM | POA: Insufficient documentation

## 2023-12-08 DIAGNOSIS — Z471 Aftercare following joint replacement surgery: Secondary | ICD-10-CM | POA: Diagnosis not present

## 2023-12-08 NOTE — Progress Notes (Unsigned)
 Subjective:  Chief complaint: follow-up for MRSA bacteremia and septic shoulder   Patient ID: [name], female    DOB: 1941-04-02, 83 y.o.   MRN: [id number]  HPI  [name] is an 83 year old woman who was admitted to the hospital with MRSA bacteremia in the context of left septic shoulder with abscess status post left shoulder I&D with hemiarthroplasty placement of hardware and antibiotic cement and with open synovectomy.  She has been on daptomycin  with course planned to continue through December 27, 2023.  Past Medical History:  Diagnosis Date   Acute encephalopathy 03/30/2021   11/12 - 04/03/2021 acute encephalopathy in the context of altered mental status and poor oral intake following increasing pain medicines and associated Klebsiella UTI.     Acute kidney injury superimposed on chronic kidney disease (HCC) 04/17/2015   11/12 - 04/03/2021 AKI with creatinine of 2.22 and GFR 22 indicating stage IV renal disease in the context of encephalopathy secondary to increased pain medications with decreased oral intake of fluids and nutrition.     Acute on chronic diastolic (congestive) heart failure (HCC)    Anemia    iron  deficiency anemia - secondary to blood loss ( chronic)    Anxiety    Arthritis    endstage changes bilateral knees/bilateral ankles.    Asthma    Carotid artery occlusion    Chest pain of uncertain etiology 01/28/2012   Normal coronaries at cath     Chronic fatigue    Chronic kidney disease    Closed intertrochanteric fracture of hip, left, initial encounter (HCC) 11/29/2016   Closed left hip fracture (HCC)    Closed nondisplaced intertrochanteric fracture of left femur with delayed healing 01/14/2017   Clotting disorder (HCC)    pt denies this   Contusion of left knee    due to fall 1/14.   COPD (chronic obstructive pulmonary disease) (HCC)    pulmonary fibrosis   Coronary artery disease    Depression, reactive    Diabetes mellitus    type II     Diastolic dysfunction    Difficulty in walking    Family history of heart disease    Flu 06/26/2023   Generalized muscle weakness    Gout    High cholesterol    History of falling    Hypertension    Hypothyroidism    Interstitial lung disease (HCC)    Meningioma of left sphenoid wing involving cavernous sinus (HCC) 02/17/2012   Continue diplopia, left eye pain and left headaches.     Morbid obesity (HCC)    Neuromuscular disorder (HCC)    diabetic neuropathy    Normal coronary arteries    cardiac catheterization performed  10/31/14   Pneumonia    RA (rheumatoid arthritis) (HCC)    has been off methotreaxte since 10/13.   Septic shock (HCC) 06/13/2023   Spinal stenosis of lumbar region    Thyroid  disease    Unspecified lack of coordination    URI (upper respiratory infection)    UTI (urinary tract infection) 03/31/2021   11/12 - 04/03/2021 Klebsiella UTI associated with encephalopathy in the context of AKI.  Status post IV ceftriaxone  followed by oral Keflex  x48 hours.      Past Surgical History:  Procedure Laterality Date   BRAIN SURGERY     Gamma knife 10/13. Needs repeat spring  '14   CARDIAC CATHETERIZATION N/A 10/31/2014   Procedure: Right/Left Heart Cath and Coronary Angiography;  Surgeon: Candyce GORMAN Reek, MD;  Location: [facility];  Service: Cardiovascular;  Laterality: N/A;   COLONOSCOPY WITH PROPOFOL  N/A 04/14/2022   Procedure: COLONOSCOPY WITH PROPOFOL ;  Surgeon: Albertus Gordy HERO, MD;  Location: [facility];  Service: Gastroenterology;  Laterality: N/A;   CYST EXCISION     2 on face   CYST REMOVAL NECK     ESOPHAGOGASTRODUODENOSCOPY (EGD) WITH PROPOFOL  N/A 09/14/2014   Procedure: ESOPHAGOGASTRODUODENOSCOPY (EGD) WITH PROPOFOL ;  Surgeon: Lamar JONETTA Aho, MD;  Location: [facility];  Service: Endoscopy;  Laterality: N/A;   INCISION AND DRAINAGE HIP Left 01/16/2017   Procedure: IRRIGATION AND DEBRIDEMENT LEFT HIP;  Surgeon: Vernetta Lonni GRADE, MD;   Location: [facility];  Service: Orthopedics;  Laterality: Left;   INTRAMEDULLARY (IM) NAIL INTERTROCHANTERIC Left 11/29/2016   Procedure: INTRAMEDULLARY (IM) NAIL INTERTROCHANTRIC;  Surgeon: Vernetta Lonni GRADE, MD;  Location: [facility] OR;  Service: Orthopedics;  Laterality: Left;   LAPAROSCOPIC LOOP COLOSTOMY N/A 07/17/2023   Procedure: LAPAROSCOPIC DIVERTING LOOP COLOSTOMY;  Surgeon: Belinda Cough, MD;  Location: [facility] OR;  Service: General;  Laterality: N/A;   OVARY SURGERY     RIGHT HEART CATH N/A 08/12/2021   Procedure: RIGHT HEART CATH;  Surgeon: Court Dorn PARAS, MD;  Location: [facility] CV [facility];  Service: Cardiovascular;  Laterality: N/A;   SHOULDER HEMI-ARTHROPLASTY Left 11/15/2023   Procedure: HEMIARTHROPLASTY, SHOULDER;  Surgeon: Cristy Bonner DASEN, MD;  Location: [facility] OR;  Service: Orthopedics;  Laterality: Left;  with Block   SHOULDER SURGERY Left    TONSILLECTOMY  age 34   VAGINAL HYSTERECTOMY     VIDEO BRONCHOSCOPY Bilateral 05/31/2013   Procedure: VIDEO BRONCHOSCOPY WITHOUT FLUORO;  Surgeon: Dorethia Cave, MD;  Location: [facility] ENDOSCOPY;  Service: Cardiopulmonary;  Laterality: Bilateral;   video bronscoscopy  05/19/1998   lung    Family History  Problem Relation Age of Onset   Diabetes Mother    Heart attack Mother    Hypertension Father    Lung cancer Father    Diabetes Sister    Breast cancer Sister    Breast cancer Sister    Diabetes Brother    Hypertension Brother    Heart disease Brother    Heart attack Brother    Kidney cancer Brother    Gout Brother    Kidney failure Brother        x 5   Esophageal cancer Brother    Stomach cancer Brother    Prostate cancer Brother    Uterine cancer Daughter        with mets   Fibroids Daughter    Hypertension Daughter    Hypertension Daughter    Hypertension Son    Heart murmur Son    Rheum arthritis Maternal Uncle    Alzheimer's disease Neg Hx    Dementia Neg Hx       Social History   Socioeconomic History   Marital  status: Married    Spouse name: Not on file   Number of children: 6   Years of education: college   Highest education level: Bachelor's degree (e.g., BA, AB, BS)  Occupational History   Occupation: retired Engineer, civil (consulting)  Tobacco Use   Smoking status: Never   Smokeless tobacco: Never  Vaping Use   Vaping status: Never Used  Substance and Sexual Activity   Alcohol  use: No   Drug use: No   Sexual activity: Not Currently  Other Topics Concern   Not on file  Social History Narrative   Patient consumes 2-3 cups coffee per day.  Social Drivers of Corporate investment banker Strain: Low Risk  (10/30/2023)   Overall Financial Resource Strain (CARDIA)    Difficulty of Paying Living Expenses: Not hard at all  Food Insecurity: No Food Insecurity (11/14/2023)   Hunger Vital Sign    Worried About Running Out of Food in the Last Year: Never true    Ran Out of Food in the Last Year: Never true  Transportation Needs: No Transportation Needs (11/14/2023)   PRAPARE - Administrator, Civil Service (Medical): No    Lack of Transportation (Non-Medical): No  Physical Activity: Insufficiently Active (10/30/2023)   Exercise Vital Sign    Days of Exercise per Week: 5 days    Minutes of Exercise per Session: 20 min  Stress: No Stress Concern Present (10/30/2023)   [employer] of Occupational Health - Occupational Stress Questionnaire    Feeling of Stress: Not at all  Social Connections: Socially Integrated (11/14/2023)   Social Connection and Isolation Panel    Frequency of Communication with Friends and Family: More than three times a week    Frequency of Social Gatherings with Friends and Family: More than three times a week    Attends Religious Services: More than 4 times per year    Active Member of [employer] or Organizations: Yes    Attends Engineer, structural: More than 4 times per year    Marital Status: Married    Allergies  Allergen Reactions   Codeine Swelling and Other  (See Comments)    Facial and leg swelling Chest pain    Remicade [Infliximab] Anaphylaxis    sent me into shock    Zestril [Lisinopril] Swelling and Rash    Face and neck swelling    Ofev  [Nintedanib] Diarrhea     Current Outpatient Medications:    acetaminophen  (TYLENOL ) 500 MG tablet, Take 1,000 mg by mouth every 8 (eight) hours as needed (pain)., Disp: , Rfl:    albuterol  (VENTOLIN  HFA) 108 (90 Base) MCG/ACT inhaler, Inhale 2 puffs into the lungs in the morning and at bedtime., Disp: , Rfl:    apixaban  (ELIQUIS ) 2.5 MG TABS tablet, Take 1 tablet (2.5 mg total) by mouth 2 (two) times daily. For postoperative DVT prophylaxis, Disp: 60 tablet, Rfl: 0   atorvastatin  (LIPITOR) 10 MG tablet, Take 1 tablet (10 mg total) by mouth daily., Disp: 90 tablet, Rfl: 3   bisacodyl  (DULCOLAX) 10 MG suppository, Place 10 mg rectally daily as needed (bowel management)., Disp: , Rfl:    Cholecalciferol  (VITAMIN D3) 50 MCG (2000 UT) TABS, Take 2,000 Units by mouth daily., Disp: , Rfl:    daptomycin  (CUBICIN ) IVPB, Inject 700 mg into the vein daily. Indication:  MRSA Bacteremia/septic arthritis of shoulder First Dose: Yes Last Day of Therapy:  12/27/23 Labs - Once weekly:  CBC/D, BMP, and CPK Labs - Once weekly: ESR and CRP, Disp: 39 Units, Rfl: 0   diclofenac  Sodium (VOLTAREN ) 1 % GEL, Apply 2 g topically 3 (three) times daily., Disp: , Rfl:    DULoxetine  (CYMBALTA ) 60 MG capsule, Take 1 capsule (60 mg total) by mouth daily., Disp: 3 capsule, Rfl: 0   feeding supplement (ENSURE PLUS HIGH PROTEIN) LIQD, Take 237 mLs by mouth 2 (two) times daily between meals., Disp: , Rfl:    furosemide  (LASIX ) 40 MG tablet, Take 1 tablet (40 mg total) by mouth daily., Disp: , Rfl:    gabapentin  (NEURONTIN ) 300 MG capsule, Take 1 capsule (300 mg total) by  mouth 2 (two) times daily. (Patient taking differently: Take 300 mg by mouth 3 (three) times daily.), Disp: , Rfl:    guaiFENesin  (MUCINEX ) 600 MG 12 hr tablet, Take 2  tablets (1,200 mg total) by mouth 2 (two) times daily., Disp: , Rfl:    hyoscyamine  (LEVSIN  SL) 0.125 MG SL tablet, Place 1 tablet (0.125 mg total) under the tongue every 8 (eight) hours as needed., Disp: 30 tablet, Rfl: 1   leptospermum manuka honey (MEDIHONEY) PSTE paste, Apply 1 Application topically daily. Interchangeable with TheraHoney Apply thin layer (3 mm) to wound. (Patient not taking: No sig reported), Disp: , Rfl:    levothyroxine  (SYNTHROID ) 50 MCG tablet, Take 1 tablet (50 mcg total) by mouth daily., Disp: 90 tablet, Rfl: 3   lidocaine  (LIDODERM ) 5 %, Place 1 patch onto the skin daily. Remove & Discard patch within 12 hours or as directed by MD, Disp: 30 patch, Rfl: 1   methocarbamol  (ROBAXIN ) 500 MG tablet, Take 1 tablet (500 mg total) by mouth every 6 (six) hours as needed for muscle spasms (pain)., Disp: , Rfl:    metoprolol  tartrate (LOPRESSOR ) 25 MG tablet, Take 2 tablets (50 mg total) by mouth 2 (two) times daily., Disp: , Rfl:    morphine  (MS CONTIN ) 15 MG 12 hr tablet, Take 1 tablet (15 mg total) by mouth every 12 (twelve) hours., Disp: 15 tablet, Rfl: 0   Multiple Vitamin (MULTIVITAMIN) tablet, Take 1 tablet by mouth daily., Disp: , Rfl:    nitroGLYCERIN  (NITROSTAT ) 0.4 MG SL tablet, Place 1 tablet (0.4 mg total) under the tongue every 5 (five) minutes as needed for chest pain. (Patient not taking: Reported on 11/13/2023), Disp: , Rfl:    oxyCODONE  (OXY IR/ROXICODONE ) 5 MG immediate release tablet, Take 1 pills every 6 hrs as needed for severe breakthrough pain, Disp: 30 tablet, Rfl: 0   OXYGEN , Inhale 3 L/min into the lungs at bedtime. As needed during day time, Disp: , Rfl:    pantoprazole  (PROTONIX ) 40 MG tablet, Take 1 tablet (40 mg total) by mouth 2 (two) times daily., Disp: 120 tablet, Rfl: 4   potassium chloride  SA (KLOR-CON  M) 20 MEQ tablet, Take 2 tablets (40 mEq total) by mouth 2 (two) times daily., Disp: , Rfl:    predniSONE  (DELTASONE ) 10 MG tablet, Take 1 tablet (10  mg total) by mouth daily with breakfast., Disp: 90 tablet, Rfl: 3   Review of Systems     Objective:   Physical Exam        Assessment & Plan:

## 2023-12-09 ENCOUNTER — Other Ambulatory Visit: Payer: Self-pay

## 2023-12-09 ENCOUNTER — Encounter: Payer: Self-pay | Admitting: Infectious Disease

## 2023-12-09 ENCOUNTER — Ambulatory Visit: Admitting: Infectious Disease

## 2023-12-09 VITALS — BP 111/83 | HR 106 | Temp 97.5°F

## 2023-12-09 DIAGNOSIS — J9611 Chronic respiratory failure with hypoxia: Secondary | ICD-10-CM | POA: Diagnosis not present

## 2023-12-09 DIAGNOSIS — F419 Anxiety disorder, unspecified: Secondary | ICD-10-CM | POA: Diagnosis not present

## 2023-12-09 DIAGNOSIS — I509 Heart failure, unspecified: Secondary | ICD-10-CM | POA: Diagnosis not present

## 2023-12-09 DIAGNOSIS — E46 Unspecified protein-calorie malnutrition: Secondary | ICD-10-CM | POA: Diagnosis not present

## 2023-12-09 DIAGNOSIS — J449 Chronic obstructive pulmonary disease, unspecified: Secondary | ICD-10-CM | POA: Diagnosis not present

## 2023-12-09 DIAGNOSIS — M069 Rheumatoid arthritis, unspecified: Secondary | ICD-10-CM | POA: Diagnosis not present

## 2023-12-09 DIAGNOSIS — I5032 Chronic diastolic (congestive) heart failure: Secondary | ICD-10-CM | POA: Diagnosis not present

## 2023-12-09 DIAGNOSIS — T847XXD Infection and inflammatory reaction due to other internal orthopedic prosthetic devices, implants and grafts, subsequent encounter: Secondary | ICD-10-CM | POA: Diagnosis not present

## 2023-12-09 DIAGNOSIS — R7881 Bacteremia: Secondary | ICD-10-CM | POA: Diagnosis not present

## 2023-12-09 DIAGNOSIS — J849 Interstitial pulmonary disease, unspecified: Secondary | ICD-10-CM | POA: Diagnosis not present

## 2023-12-09 DIAGNOSIS — M6281 Muscle weakness (generalized): Secondary | ICD-10-CM | POA: Diagnosis not present

## 2023-12-09 DIAGNOSIS — E876 Hypokalemia: Secondary | ICD-10-CM

## 2023-12-09 DIAGNOSIS — Z741 Need for assistance with personal care: Secondary | ICD-10-CM | POA: Diagnosis not present

## 2023-12-09 DIAGNOSIS — B9562 Methicillin resistant Staphylococcus aureus infection as the cause of diseases classified elsewhere: Secondary | ICD-10-CM

## 2023-12-09 DIAGNOSIS — Z471 Aftercare following joint replacement surgery: Secondary | ICD-10-CM | POA: Diagnosis not present

## 2023-12-09 DIAGNOSIS — M00012 Staphylococcal arthritis, left shoulder: Secondary | ICD-10-CM | POA: Diagnosis not present

## 2023-12-09 DIAGNOSIS — R2689 Other abnormalities of gait and mobility: Secondary | ICD-10-CM | POA: Diagnosis not present

## 2023-12-09 DIAGNOSIS — D849 Immunodeficiency, unspecified: Secondary | ICD-10-CM | POA: Diagnosis not present

## 2023-12-09 DIAGNOSIS — M6259 Muscle wasting and atrophy, not elsewhere classified, multiple sites: Secondary | ICD-10-CM | POA: Diagnosis not present

## 2023-12-09 MED ORDER — SULFAMETHOXAZOLE-TRIMETHOPRIM 800-160 MG PO TABS
2.0000 | ORAL_TABLET | Freq: Two times a day (BID) | ORAL | 9 refills | Status: DC
Start: 2023-12-09 — End: 2023-12-09

## 2023-12-09 MED ORDER — SULFAMETHOXAZOLE-TRIMETHOPRIM 800-160 MG PO TABS: 2.0000 | ORAL_TABLET | Freq: Two times a day (BID) | ORAL | 9 refills | Status: DC

## 2023-12-10 DIAGNOSIS — Z471 Aftercare following joint replacement surgery: Secondary | ICD-10-CM | POA: Diagnosis not present

## 2023-12-10 DIAGNOSIS — M0000 Staphylococcal arthritis, unspecified joint: Secondary | ICD-10-CM | POA: Diagnosis not present

## 2023-12-14 DIAGNOSIS — R799 Abnormal finding of blood chemistry, unspecified: Secondary | ICD-10-CM | POA: Diagnosis not present

## 2023-12-14 DIAGNOSIS — L89623 Pressure ulcer of left heel, stage 3: Secondary | ICD-10-CM | POA: Diagnosis not present

## 2023-12-15 ENCOUNTER — Emergency Department (HOSPITAL_COMMUNITY)

## 2023-12-15 ENCOUNTER — Inpatient Hospital Stay (HOSPITAL_COMMUNITY)
Admission: EM | Admit: 2023-12-15 | Discharge: 2023-12-23 | DRG: 871 | Disposition: A | Discharge status: Other Acute Inpatient Hospital | Source: Other Acute Inpatient Hospital

## 2023-12-15 ENCOUNTER — Other Ambulatory Visit: Payer: Self-pay

## 2023-12-15 ENCOUNTER — Encounter (HOSPITAL_COMMUNITY): Payer: Self-pay

## 2023-12-15 DIAGNOSIS — I2510 Atherosclerotic heart disease of native coronary artery without angina pectoris: Secondary | ICD-10-CM | POA: Diagnosis present

## 2023-12-15 DIAGNOSIS — Z933 Colostomy status: Secondary | ICD-10-CM | POA: Diagnosis not present

## 2023-12-15 DIAGNOSIS — D849 Immunodeficiency, unspecified: Secondary | ICD-10-CM | POA: Diagnosis not present

## 2023-12-15 DIAGNOSIS — Z888 Allergy status to other drugs, medicaments and biological substances status: Secondary | ICD-10-CM

## 2023-12-15 DIAGNOSIS — E039 Hypothyroidism, unspecified: Secondary | ICD-10-CM | POA: Diagnosis present

## 2023-12-15 DIAGNOSIS — T8459XD Infection and inflammatory reaction due to other internal joint prosthesis, subsequent encounter: Secondary | ICD-10-CM

## 2023-12-15 DIAGNOSIS — I110 Hypertensive heart disease with heart failure: Secondary | ICD-10-CM | POA: Diagnosis present

## 2023-12-15 DIAGNOSIS — Z8709 Personal history of other diseases of the respiratory system: Secondary | ICD-10-CM

## 2023-12-15 DIAGNOSIS — Z800 Family history of malignant neoplasm of digestive organs: Secondary | ICD-10-CM

## 2023-12-15 DIAGNOSIS — J029 Acute pharyngitis, unspecified: Secondary | ICD-10-CM | POA: Diagnosis not present

## 2023-12-15 DIAGNOSIS — Z9981 Dependence on supplemental oxygen: Secondary | ICD-10-CM

## 2023-12-15 DIAGNOSIS — Y95 Nosocomial condition: Secondary | ICD-10-CM | POA: Diagnosis not present

## 2023-12-15 DIAGNOSIS — J849 Interstitial pulmonary disease, unspecified: Secondary | ICD-10-CM | POA: Diagnosis not present

## 2023-12-15 DIAGNOSIS — J704 Drug-induced interstitial lung disorders, unspecified: Secondary | ICD-10-CM | POA: Diagnosis present

## 2023-12-15 DIAGNOSIS — E782 Mixed hyperlipidemia: Secondary | ICD-10-CM

## 2023-12-15 DIAGNOSIS — D631 Anemia in chronic kidney disease: Secondary | ICD-10-CM | POA: Diagnosis present

## 2023-12-15 DIAGNOSIS — Z801 Family history of malignant neoplasm of trachea, bronchus and lung: Secondary | ICD-10-CM

## 2023-12-15 DIAGNOSIS — E119 Type 2 diabetes mellitus without complications: Secondary | ICD-10-CM

## 2023-12-15 DIAGNOSIS — Z8042 Family history of malignant neoplasm of prostate: Secondary | ICD-10-CM

## 2023-12-15 DIAGNOSIS — E860 Dehydration: Secondary | ICD-10-CM | POA: Diagnosis present

## 2023-12-15 DIAGNOSIS — R0602 Shortness of breath: Principal | ICD-10-CM | POA: Diagnosis present

## 2023-12-15 DIAGNOSIS — D638 Anemia in other chronic diseases classified elsewhere: Secondary | ICD-10-CM | POA: Diagnosis present

## 2023-12-15 DIAGNOSIS — Z9181 History of falling: Secondary | ICD-10-CM

## 2023-12-15 DIAGNOSIS — Z8249 Family history of ischemic heart disease and other diseases of the circulatory system: Secondary | ICD-10-CM

## 2023-12-15 DIAGNOSIS — A419 Sepsis, unspecified organism: Secondary | ICD-10-CM | POA: Diagnosis not present

## 2023-12-15 DIAGNOSIS — N281 Cyst of kidney, acquired: Secondary | ICD-10-CM | POA: Diagnosis not present

## 2023-12-15 DIAGNOSIS — M00012 Staphylococcal arthritis, left shoulder: Secondary | ICD-10-CM | POA: Diagnosis not present

## 2023-12-15 DIAGNOSIS — J80 Acute respiratory distress syndrome: Secondary | ICD-10-CM | POA: Diagnosis not present

## 2023-12-15 DIAGNOSIS — T368X5A Adverse effect of other systemic antibiotics, initial encounter: Secondary | ICD-10-CM | POA: Diagnosis present

## 2023-12-15 DIAGNOSIS — A4102 Sepsis due to Methicillin resistant Staphylococcus aureus: Principal | ICD-10-CM | POA: Diagnosis present

## 2023-12-15 DIAGNOSIS — K5730 Diverticulosis of large intestine without perforation or abscess without bleeding: Secondary | ICD-10-CM | POA: Diagnosis not present

## 2023-12-15 DIAGNOSIS — Z90710 Acquired absence of both cervix and uterus: Secondary | ICD-10-CM

## 2023-12-15 DIAGNOSIS — M19071 Primary osteoarthritis, right ankle and foot: Secondary | ICD-10-CM | POA: Diagnosis present

## 2023-12-15 DIAGNOSIS — J690 Pneumonitis due to inhalation of food and vomit: Secondary | ICD-10-CM | POA: Diagnosis not present

## 2023-12-15 DIAGNOSIS — Z9049 Acquired absence of other specified parts of digestive tract: Secondary | ICD-10-CM

## 2023-12-15 DIAGNOSIS — E1142 Type 2 diabetes mellitus with diabetic polyneuropathy: Secondary | ICD-10-CM | POA: Diagnosis present

## 2023-12-15 DIAGNOSIS — J449 Chronic obstructive pulmonary disease, unspecified: Secondary | ICD-10-CM | POA: Diagnosis present

## 2023-12-15 DIAGNOSIS — Z833 Family history of diabetes mellitus: Secondary | ICD-10-CM

## 2023-12-15 DIAGNOSIS — J9621 Acute and chronic respiratory failure with hypoxia: Secondary | ICD-10-CM | POA: Diagnosis not present

## 2023-12-15 DIAGNOSIS — Z841 Family history of disorders of kidney and ureter: Secondary | ICD-10-CM

## 2023-12-15 DIAGNOSIS — J9611 Chronic respiratory failure with hypoxia: Secondary | ICD-10-CM | POA: Diagnosis present

## 2023-12-15 DIAGNOSIS — J8281 Chronic eosinophilic pneumonia: Secondary | ICD-10-CM | POA: Diagnosis present

## 2023-12-15 DIAGNOSIS — Z8049 Family history of malignant neoplasm of other genital organs: Secondary | ICD-10-CM

## 2023-12-15 DIAGNOSIS — M19072 Primary osteoarthritis, left ankle and foot: Secondary | ICD-10-CM | POA: Diagnosis present

## 2023-12-15 DIAGNOSIS — M069 Rheumatoid arthritis, unspecified: Secondary | ICD-10-CM | POA: Diagnosis present

## 2023-12-15 DIAGNOSIS — I5032 Chronic diastolic (congestive) heart failure: Secondary | ICD-10-CM | POA: Diagnosis present

## 2023-12-15 DIAGNOSIS — Z452 Encounter for adjustment and management of vascular access device: Secondary | ICD-10-CM | POA: Diagnosis not present

## 2023-12-15 DIAGNOSIS — B9562 Methicillin resistant Staphylococcus aureus infection as the cause of diseases classified elsewhere: Secondary | ICD-10-CM | POA: Diagnosis present

## 2023-12-15 DIAGNOSIS — Z8051 Family history of malignant neoplasm of kidney: Secondary | ICD-10-CM

## 2023-12-15 DIAGNOSIS — M170 Bilateral primary osteoarthritis of knee: Secondary | ICD-10-CM | POA: Diagnosis present

## 2023-12-15 DIAGNOSIS — I10 Essential (primary) hypertension: Secondary | ICD-10-CM | POA: Diagnosis present

## 2023-12-15 DIAGNOSIS — J189 Pneumonia, unspecified organism: Secondary | ICD-10-CM | POA: Diagnosis not present

## 2023-12-15 DIAGNOSIS — R109 Unspecified abdominal pain: Secondary | ICD-10-CM | POA: Diagnosis not present

## 2023-12-15 DIAGNOSIS — Z885 Allergy status to narcotic agent status: Secondary | ICD-10-CM

## 2023-12-15 DIAGNOSIS — J9601 Acute respiratory failure with hypoxia: Secondary | ICD-10-CM | POA: Diagnosis not present

## 2023-12-15 DIAGNOSIS — R0902 Hypoxemia: Secondary | ICD-10-CM | POA: Diagnosis not present

## 2023-12-15 DIAGNOSIS — Z96612 Presence of left artificial shoulder joint: Secondary | ICD-10-CM | POA: Diagnosis not present

## 2023-12-15 DIAGNOSIS — L89152 Pressure ulcer of sacral region, stage 2: Secondary | ICD-10-CM | POA: Diagnosis present

## 2023-12-15 DIAGNOSIS — Z1152 Encounter for screening for COVID-19: Secondary | ICD-10-CM | POA: Diagnosis not present

## 2023-12-15 DIAGNOSIS — T50905A Adverse effect of unspecified drugs, medicaments and biological substances, initial encounter: Secondary | ICD-10-CM | POA: Diagnosis not present

## 2023-12-15 DIAGNOSIS — E785 Hyperlipidemia, unspecified: Secondary | ICD-10-CM | POA: Diagnosis present

## 2023-12-15 DIAGNOSIS — N200 Calculus of kidney: Secondary | ICD-10-CM | POA: Diagnosis not present

## 2023-12-15 DIAGNOSIS — M00019 Staphylococcal arthritis, unspecified shoulder: Secondary | ICD-10-CM | POA: Diagnosis not present

## 2023-12-15 DIAGNOSIS — R6520 Severe sepsis without septic shock: Secondary | ICD-10-CM | POA: Diagnosis not present

## 2023-12-15 DIAGNOSIS — Z803 Family history of malignant neoplasm of breast: Secondary | ICD-10-CM

## 2023-12-15 DIAGNOSIS — Z79899 Other long term (current) drug therapy: Secondary | ICD-10-CM

## 2023-12-15 DIAGNOSIS — R54 Age-related physical debility: Secondary | ICD-10-CM | POA: Diagnosis present

## 2023-12-15 DIAGNOSIS — E7800 Pure hypercholesterolemia, unspecified: Secondary | ICD-10-CM | POA: Diagnosis present

## 2023-12-15 DIAGNOSIS — M009 Pyogenic arthritis, unspecified: Secondary | ICD-10-CM | POA: Diagnosis present

## 2023-12-15 DIAGNOSIS — R000 Tachycardia, unspecified: Secondary | ICD-10-CM | POA: Diagnosis not present

## 2023-12-15 DIAGNOSIS — N179 Acute kidney failure, unspecified: Secondary | ICD-10-CM | POA: Diagnosis not present

## 2023-12-15 DIAGNOSIS — J811 Chronic pulmonary edema: Secondary | ICD-10-CM | POA: Diagnosis not present

## 2023-12-15 DIAGNOSIS — Z7901 Long term (current) use of anticoagulants: Secondary | ICD-10-CM

## 2023-12-15 DIAGNOSIS — J159 Unspecified bacterial pneumonia: Secondary | ICD-10-CM | POA: Diagnosis not present

## 2023-12-15 DIAGNOSIS — R918 Other nonspecific abnormal finding of lung field: Secondary | ICD-10-CM | POA: Diagnosis not present

## 2023-12-15 DIAGNOSIS — Z7952 Long term (current) use of systemic steroids: Secondary | ICD-10-CM

## 2023-12-15 DIAGNOSIS — Z79890 Hormone replacement therapy (postmenopausal): Secondary | ICD-10-CM

## 2023-12-15 DIAGNOSIS — R7989 Other specified abnormal findings of blood chemistry: Secondary | ICD-10-CM | POA: Diagnosis present

## 2023-12-15 LAB — COMPREHENSIVE METABOLIC PANEL WITH GFR
ALT: 27 U/L (ref 0–44)
AST: 25 U/L (ref 15–41)
Albumin: 2 g/dL — ABNORMAL LOW (ref 3.5–5.0)
Alkaline Phosphatase: 181 U/L — ABNORMAL HIGH (ref 38–126)
Anion gap: 13 (ref 5–15)
BUN: 25 mg/dL — ABNORMAL HIGH (ref 8–23)
CO2: 25 mmol/L (ref 22–32)
Calcium: 7.9 mg/dL — ABNORMAL LOW (ref 8.9–10.3)
Chloride: 99 mmol/L (ref 98–111)
Creatinine, Ser: 1.12 mg/dL — ABNORMAL HIGH (ref 0.44–1.00)
GFR, Estimated: 49 mL/min — ABNORMAL LOW (ref >60)
Glucose, Bld: 125 mg/dL — ABNORMAL HIGH (ref 70–99)
Potassium: 4.4 mmol/L (ref 3.5–5.1)
Sodium: 137 mmol/L (ref 135–145)
Total Bilirubin: 1 mg/dL (ref 0.0–1.2)
Total Protein: 5.9 g/dL — ABNORMAL LOW (ref 6.5–8.1)

## 2023-12-15 LAB — CBC WITH DIFFERENTIAL/PLATELET
Abs Immature Granulocytes: 0.15 K/uL — ABNORMAL HIGH (ref 0.00–0.07)
Basophils Absolute: 0.1 K/uL (ref 0.0–0.1)
Basophils Relative: 0 %
Eosinophils Absolute: 0.3 K/uL (ref 0.0–0.5)
Eosinophils Relative: 2 %
HCT: 32.3 % — ABNORMAL LOW (ref 36.0–46.0)
Hemoglobin: 9.8 g/dL — ABNORMAL LOW (ref 12.0–15.0)
Immature Granulocytes: 1 %
Lymphocytes Relative: 8 %
Lymphs Abs: 1.4 K/uL (ref 0.7–4.0)
MCH: 28.7 pg (ref 26.0–34.0)
MCHC: 30.3 g/dL (ref 30.0–36.0)
MCV: 94.7 fL (ref 80.0–100.0)
Monocytes Absolute: 0.9 K/uL (ref 0.1–1.0)
Monocytes Relative: 5 %
Neutro Abs: 13.5 K/uL — ABNORMAL HIGH (ref 1.7–7.7)
Neutrophils Relative %: 84 %
Platelets: 226 K/uL (ref 150–400)
RBC: 3.41 MIL/uL — ABNORMAL LOW (ref 3.87–5.11)
RDW: 16 % — ABNORMAL HIGH (ref 11.5–15.5)
WBC: 16.3 K/uL — ABNORMAL HIGH (ref 4.0–10.5)
nRBC: 0 % (ref 0.0–0.2)

## 2023-12-15 LAB — BRAIN NATRIURETIC PEPTIDE: B Natriuretic Peptide: 23.8 pg/mL (ref 0.0–100.0)

## 2023-12-15 LAB — RESP PANEL BY RT-PCR (RSV, FLU A&B, COVID)  RVPGX2
Influenza A by PCR: NEGATIVE
Influenza B by PCR: NEGATIVE
Resp Syncytial Virus by PCR: NEGATIVE
SARS Coronavirus 2 by RT PCR: NEGATIVE

## 2023-12-15 LAB — TROPONIN I (HIGH SENSITIVITY)
Troponin I (High Sensitivity): 26 ng/L — ABNORMAL HIGH (ref <18)
Troponin I (High Sensitivity): 26 ng/L — ABNORMAL HIGH (ref <18)

## 2023-12-15 LAB — GROUP A STREP BY PCR: Group A Strep by PCR: NOT DETECTED

## 2023-12-15 LAB — MAGNESIUM: Magnesium: 1.9 mg/dL (ref 1.7–2.4)

## 2023-12-15 MED ORDER — SODIUM CHLORIDE 0.9 % IV SOLN
1.0000 g | INTRAVENOUS | Status: DC
  Administered 2023-12-15 – 2023-12-16 (×2): 1 g via INTRAVENOUS
  Filled 2023-12-15 (×2): qty 10

## 2023-12-15 MED ORDER — ONDANSETRON HCL 4 MG/2ML IJ SOLN
4.0000 mg | Freq: Four times a day (QID) | INTRAMUSCULAR | Status: DC | PRN
  Administered 2023-12-16: 4 mg via INTRAVENOUS
  Filled 2023-12-15: qty 2

## 2023-12-15 MED ORDER — IPRATROPIUM-ALBUTEROL 0.5-2.5 (3) MG/3ML IN SOLN
3.0000 mL | Freq: Four times a day (QID) | RESPIRATORY_TRACT | Status: DC
  Administered 2023-12-15 – 2023-12-16 (×3): 3 mL via RESPIRATORY_TRACT
  Filled 2023-12-15 (×4): qty 3

## 2023-12-15 MED ORDER — GUAIFENESIN 100 MG/5ML PO LIQD
5.0000 mL | ORAL | Status: DC | PRN
  Administered 2023-12-17 – 2023-12-23 (×8): 5 mL via ORAL
  Filled 2023-12-15 (×8): qty 10

## 2023-12-15 MED ORDER — MELATONIN 3 MG PO TABS
3.0000 mg | ORAL_TABLET | Freq: Every evening | ORAL | Status: DC | PRN
  Administered 2023-12-16 – 2023-12-20 (×4): 3 mg via ORAL
  Filled 2023-12-15 (×4): qty 1

## 2023-12-15 MED ORDER — SODIUM CHLORIDE 0.9 % IV SOLN
500.0000 mg | INTRAVENOUS | Status: DC
  Administered 2023-12-15: 500 mg via INTRAVENOUS
  Filled 2023-12-15: qty 5

## 2023-12-15 MED ORDER — ACETAMINOPHEN 650 MG RE SUPP: 650.0000 mg | Freq: Four times a day (QID) | RECTAL | Status: DC | PRN

## 2023-12-15 MED ORDER — ALBUTEROL SULFATE (2.5 MG/3ML) 0.083% IN NEBU: 2.5000 mg | INHALATION_SOLUTION | RESPIRATORY_TRACT | Status: DC | PRN

## 2023-12-15 MED ORDER — IOHEXOL 350 MG/ML SOLN
75.0000 mL | Freq: Once | INTRAVENOUS | Status: AC | PRN
  Administered 2023-12-15: 75 mL via INTRAVENOUS

## 2023-12-15 MED ORDER — ACETAMINOPHEN 325 MG PO TABS
650.0000 mg | ORAL_TABLET | Freq: Four times a day (QID) | ORAL | Status: DC | PRN
  Administered 2023-12-16 – 2023-12-20 (×4): 650 mg via ORAL
  Filled 2023-12-15 (×5): qty 2

## 2023-12-15 NOTE — ED Provider Notes (Signed)
 See prior team's note for full HPI.  Reports to me at my evaluation that she has felt bad for the past several days, has had worsening shortness of breath that seemingly acutely worsened this morning.  Son at bedside notes that mother was tiring out quite quickly and appeared to be quite short of breath with minimal exertion today.  She is on antibiotics, but it does appear that she has pneumonia.  Is saturating well at rest, but when speaking in long sentences she did desaturate down into the high 80s.  Does not appear to be in respiratory distress.  Will admit for pneumonia.  Clinical Course as of 12/15/23 1848  Tue Dec 15, 2023  1521 Received signout; pending CT scans.  [TY]  1622 CT ABDOMEN PELVIS W CONTRAST I do not appreciate free air on my independent review of images. [TY]  1623 CT Angio Chest PE W and/or Wo Contrast No large saddle PE.  Does appear to have some infiltrates [TY]    Clinical Course User Index [TY] Neysa Caron PARAS, DO      Neysa Caron PARAS, [place] 12/15/23 1850

## 2023-12-15 NOTE — H&P (Signed)
 History and Physical      [name] FMW:[id number] DOB: 18-Jun-1940 DOA: 12/15/2023; DOS: 12/15/2023  PCP: Mercer Clotilda SAUNDERS, MD *** Patient coming from: home ***  I have personally briefly reviewed patient's old medical records in [facility]  Chief Complaint: ***  HPI: [name] is a 83 y.o. female with medical history significant for *** who is admitted to [facility] on 12/15/2023 with *** after presenting from home*** to [facility] ED complaining of ***.    ***       ***   ED Course:  Vital signs in the ED were notable for the following: ***  Labs were notable for the following: ***  Per my interpretation, EKG in ED demonstrated the following:  ***  Imaging in the ED, per corresponding formal radiology read, was notable for the following:  ***  While in the ED, the following were administered: ***  Subsequently, the patient was admitted  ***  ***red    Review of Systems: As per HPI otherwise 10 point review of systems negative.   Past Medical History:  Diagnosis Date   Acute encephalopathy 03/30/2021   11/12 - 04/03/2021 acute encephalopathy in the context of altered mental status and poor oral intake following increasing pain medicines and associated Klebsiella UTI.     Acute kidney injury superimposed on chronic kidney disease (HCC) 04/17/2015   11/12 - 04/03/2021 AKI with creatinine of 2.22 and GFR 22 indicating stage IV renal disease in the context of encephalopathy secondary to increased pain medications with decreased oral intake of fluids and nutrition.     Acute on chronic diastolic (congestive) heart failure (HCC)    Anemia    iron  deficiency anemia - secondary to blood loss ( chronic)    Anxiety    Arthritis    endstage changes bilateral knees/bilateral ankles.    Asthma    Carotid artery occlusion    Chest pain of uncertain etiology 01/28/2012   Normal coronaries at cath     Chronic fatigue    Chronic kidney disease     Closed intertrochanteric fracture of hip, left, initial encounter (HCC) 11/29/2016   Closed left hip fracture (HCC)    Closed nondisplaced intertrochanteric fracture of left femur with delayed healing 01/14/2017   Clotting disorder (HCC)    pt denies this   Contusion of left knee    due to fall 1/14.   COPD (chronic obstructive pulmonary disease) (HCC)    pulmonary fibrosis   Coronary artery disease    Depression, reactive    Diabetes mellitus    type II    Diastolic dysfunction    Difficulty in walking    Family history of heart disease    Flu 06/26/2023   Generalized muscle weakness    Gout    Hardware complicating wound infection (HCC) 12/08/2023   High cholesterol    History of falling    Hypertension    Hypothyroidism    Interstitial lung disease (HCC)    Meningioma of left sphenoid wing involving cavernous sinus (HCC) 02/17/2012   Continue diplopia, left eye pain and left headaches.     Morbid obesity (HCC)    Neuromuscular disorder (HCC)    diabetic neuropathy    Normal coronary arteries    cardiac catheterization performed  10/31/14   Pneumonia    RA (rheumatoid arthritis) (HCC)    has been off methotreaxte since 10/13.   Septic shock (HCC) 06/13/2023   Spinal stenosis of  lumbar region    Thyroid  disease    Unspecified lack of coordination    URI (upper respiratory infection)    UTI (urinary tract infection) 03/31/2021   11/12 - 04/03/2021 Klebsiella UTI associated with encephalopathy in the context of AKI.  Status post IV ceftriaxone  followed by oral Keflex  x48 hours.      Past Surgical History:  Procedure Laterality Date   BRAIN SURGERY     Gamma knife 10/13. Needs repeat spring  '14   CARDIAC CATHETERIZATION N/A 10/31/2014   Procedure: Right/Left Heart Cath and Coronary Angiography;  Surgeon: Candyce GORMAN Reek, MD;  Location: [facility] INVASIVE CV LAB;  Service: Cardiovascular;  Laterality: N/A;   COLONOSCOPY WITH PROPOFOL  N/A 04/14/2022   Procedure:  COLONOSCOPY WITH PROPOFOL ;  Surgeon: Albertus Gordy HERO, MD;  Location: [facility] ENDOSCOPY;  Service: Gastroenterology;  Laterality: N/A;   CYST EXCISION     2 on face   CYST REMOVAL NECK     ESOPHAGOGASTRODUODENOSCOPY (EGD) WITH PROPOFOL  N/A 09/14/2014   Procedure: ESOPHAGOGASTRODUODENOSCOPY (EGD) WITH PROPOFOL ;  Surgeon: Lamar JONETTA Aho, MD;  Location: [facility] ENDOSCOPY;  Service: Endoscopy;  Laterality: N/A;   INCISION AND DRAINAGE HIP Left 01/16/2017   Procedure: IRRIGATION AND DEBRIDEMENT LEFT HIP;  Surgeon: Vernetta Lonni GRADE, MD;  Location: [facility] ORS;  Service: Orthopedics;  Laterality: Left;   INTRAMEDULLARY (IM) NAIL INTERTROCHANTERIC Left 11/29/2016   Procedure: INTRAMEDULLARY (IM) NAIL INTERTROCHANTRIC;  Surgeon: Vernetta Lonni GRADE, MD;  Location: [facility] OR;  Service: Orthopedics;  Laterality: Left;   LAPAROSCOPIC LOOP COLOSTOMY N/A 07/17/2023   Procedure: LAPAROSCOPIC DIVERTING LOOP COLOSTOMY;  Surgeon: Belinda Cough, MD;  Location: [facility] OR;  Service: General;  Laterality: N/A;   OVARY SURGERY     RIGHT HEART CATH N/A 08/12/2021   Procedure: RIGHT HEART CATH;  Surgeon: Court Dorn PARAS, MD;  Location: [facility] INVASIVE CV LAB;  Service: Cardiovascular;  Laterality: N/A;   SHOULDER HEMI-ARTHROPLASTY Left 11/15/2023   Procedure: HEMIARTHROPLASTY, SHOULDER;  Surgeon: Cristy Bonner DASEN, MD;  Location: [facility] OR;  Service: Orthopedics;  Laterality: Left;  with Block   SHOULDER SURGERY Left    TONSILLECTOMY  age 66   VAGINAL HYSTERECTOMY     VIDEO BRONCHOSCOPY Bilateral 05/31/2013   Procedure: VIDEO BRONCHOSCOPY WITHOUT FLUORO;  Surgeon: Dorethia Cave, MD;  Location: [facility] ENDOSCOPY;  Service: Cardiopulmonary;  Laterality: Bilateral;   video bronscoscopy  05/19/1998   lung    Social History:  reports that she has never smoked. She has never used smokeless tobacco. She reports that she does not drink alcohol  and does not use drugs.   Allergies  Allergen Reactions   Codeine Swelling and Other (See Comments)     Facial and leg swelling Chest pain    Remicade [Infliximab] Anaphylaxis    sent me into shock    Zestril [Lisinopril] Swelling and Rash    Face and neck swelling    Ofev  [Nintedanib] Diarrhea    Family History  Problem Relation Age of Onset   Diabetes Mother    Heart attack Mother    Hypertension Father    Lung cancer Father    Diabetes Sister    Breast cancer Sister    Breast cancer Sister    Diabetes Brother    Hypertension Brother    Heart disease Brother    Heart attack Brother    Kidney cancer Brother    Gout Brother    Kidney failure Brother        x 5   Esophageal cancer Brother  Stomach cancer Brother    Prostate cancer Brother    Uterine cancer Daughter        with mets   Fibroids Daughter    Hypertension Daughter    Hypertension Daughter    Hypertension Son    Heart murmur Son    Rheum arthritis Maternal Uncle    Alzheimer's disease Neg Hx    Dementia Neg Hx     Family history reviewed and not pertinent ***   Prior to Admission medications   Medication Sig Start Date End Date Taking? Authorizing Provider  acetaminophen  (TYLENOL ) 500 MG tablet Take 1,000 mg by mouth every 8 (eight) hours as needed (pain).    [provider]  albuterol  (VENTOLIN  HFA) 108 (90 Base) MCG/ACT inhaler Inhale 2 puffs into the lungs in the morning and at bedtime.    [provider]  apixaban  (ELIQUIS ) 2.5 MG TABS tablet Take 1 tablet (2.5 mg total) by mouth 2 (two) times daily. For postoperative DVT prophylaxis 11/17/23 12/17/23  McBane, Caroline N, PA-C  atorvastatin  (LIPITOR) 10 MG tablet Take 1 tablet (10 mg total) by mouth daily. 08/14/22   Mercer Clotilda SAUNDERS, MD  bisacodyl  (DULCOLAX) 10 MG suppository Place 10 mg rectally daily as needed (bowel management).    [provider]  Cholecalciferol  (VITAMIN D3) 50 MCG (2000 UT) TABS Take 2,000 Units by mouth daily.    [provider]  daptomycin  (CUBICIN ) IVPB Inject 700 mg into the vein  daily. Indication:  MRSA Bacteremia/septic arthritis of shoulder First Dose: Yes Last Day of Therapy:  12/27/23 Labs - Once weekly:  CBC/D, BMP, and CPK Labs - Once weekly: ESR and CRP 11/22/23 12/31/23  Darci Pore, MD  diclofenac  Sodium (VOLTAREN ) 1 % GEL Apply 2 g topically 3 (three) times daily. 08/04/23   Angiulli, Toribio PARAS, PA-C  DULoxetine  (CYMBALTA ) 60 MG capsule Take 1 capsule (60 mg total) by mouth daily. 08/05/23   Angiulli, Toribio PARAS, PA-C  feeding supplement (ENSURE PLUS HIGH PROTEIN) LIQD Take 237 mLs by mouth 2 (two) times daily between meals. 11/22/23   Darci Pore, MD  furosemide  (LASIX ) 40 MG tablet Take 1 tablet (40 mg total) by mouth daily. 08/05/23   Angiulli, Toribio PARAS, PA-C  gabapentin  (NEURONTIN ) 300 MG capsule Take 1 capsule (300 mg total) by mouth 2 (two) times daily. 08/04/23   Angiulli, Toribio PARAS, PA-C  guaiFENesin  (MUCINEX ) 600 MG 12 hr tablet Take 2 tablets (1,200 mg total) by mouth 2 (two) times daily. 08/04/23   Angiulli, Toribio PARAS, PA-C  hyoscyamine  (LEVSIN  SL) 0.125 MG SL tablet Place 1 tablet (0.125 mg total) under the tongue every 8 (eight) hours as needed. 11/04/23   Zehr, Jessica D, PA-C  leptospermum manuka honey (MEDIHONEY) PSTE paste Apply 1 Application topically daily. Interchangeable with TheraHoney Apply thin layer (3 mm) to wound. Patient not taking: Reported on 12/09/2023 08/15/23   Rai, Nydia POUR, MD  levothyroxine  (SYNTHROID ) 50 MCG tablet Take 1 tablet (50 mcg total) by mouth daily. 11/28/22   Mercer Clotilda SAUNDERS, MD  lidocaine  (LIDODERM ) 5 % Place 1 patch onto the skin daily. Remove & Discard patch within 12 hours or as directed by MD 11/09/23   Debby Fidela CROME, NP  methocarbamol  (ROBAXIN ) 500 MG tablet Take 1 tablet (500 mg total) by mouth every 6 (six) hours as needed for muscle spasms (pain). 08/04/23   Angiulli, Toribio PARAS, PA-C  metoprolol  tartrate (LOPRESSOR ) 25 MG tablet Take 2 tablets (50 mg total) by mouth 2 (  two) times daily. 11/22/23   Darci Pore, MD  morphine  (MS CONTIN ) 15 MG 12 hr tablet Take 1 tablet (15 mg total) by mouth every 12 (twelve) hours. 11/22/23   Darci Pore, MD  Multiple Vitamin (MULTIVITAMIN) tablet Take 1 tablet by mouth daily.    [provider]  nitroGLYCERIN  (NITROSTAT ) 0.4 MG SL tablet Place 1 tablet (0.4 mg total) under the tongue every 5 (five) minutes as needed for chest pain. 08/04/23   Angiulli, Toribio PARAS, PA-C  oxyCODONE  (OXY IR/ROXICODONE ) 5 MG immediate release tablet Take 1 pills every 6 hrs as needed for severe breakthrough pain 11/17/23   McBane, Caroline N, PA-C  OXYGEN  Inhale 3 L/min into the lungs at bedtime. As needed during day time    [provider]  pantoprazole  (PROTONIX ) 40 MG tablet Take 1 tablet (40 mg total) by mouth 2 (two) times daily. 06/23/23   Mercer Clotilda SAUNDERS, MD  potassium chloride  SA (KLOR-CON  M) 20 MEQ tablet Take 2 tablets (40 mEq total) by mouth 2 (two) times daily. 11/22/23   Darci Pore, MD  predniSONE  (DELTASONE ) 10 MG tablet Take 1 tablet (10 mg total) by mouth daily with breakfast. 10/15/23   Mercer Clotilda SAUNDERS, MD  sulfamethoxazole -trimethoprim  (BACTRIM  DS) 800-160 MG tablet Take 2 tablets by mouth 2 (two) times daily. 12/09/23   Fleeta Kathie Jomarie LOISE, MD     Objective    Physical Exam: Vitals:   12/15/23 1400 12/15/23 1645 12/15/23 1700 12/15/23 1830  BP: (!) 144/93 (!) 117/98 (!) 137/105 (!) 151/97  Pulse: 88 94 98 95  Resp: 18 17 19 17   Temp:   98.3 F (36.8 C)   TempSrc:   Oral   SpO2: 95% 98% 90% 96%  Weight:      Height:        General: appears to be stated age; alert, oriented Skin: warm, dry, no rash Head:  AT/[place]:  Oral mucosa membranes appear moist, normal dentition Neck: supple; trachea midline Heart:  RRR; did not appreciate any M/R/G Lungs: CTAB, did not appreciate any wheezes, rales, or rhonchi Abdomen: + BS; soft, ND, NT Vascular: 2+ pedal pulses b/l; 2+ radial pulses b/l Extremities: no peripheral  edema, no muscle wasting Neuro: strength and sensation intact in upper and lower extremities b/l ***   *** Neuro: 5/5 strength of the proximal and distal flexors and extensors of the upper and lower extremities bilaterally; sensation intact in upper and lower extremities b/l; cranial nerves II through XII grossly intact; no pronator drift; no evidence suggestive of slurred speech, dysarthria, or facial droop; Normal muscle tone. No tremors.  *** Neuro: In the setting of the patient's current mental status and associated inability to follow instructions, unable to perform full neurologic exam at this time.  As such, assessment of strength, sensation, and cranial nerves is limited at this time. Patient noted to spontaneously move all 4 extremities. No tremors.  ***    Labs on Admission: I have personally reviewed following labs and imaging studies  CBC: Recent Labs  Lab 12/15/23 1240  WBC 16.3*  NEUTROABS 13.5*  HGB 9.8*  HCT 32.3*  MCV 94.7  PLT 226   Basic Metabolic Panel: Recent Labs  Lab 12/15/23 1240  NA 137  K 4.4  CL 99  CO2 25  GLUCOSE 125*  BUN 25*  CREATININE 1.12*  CALCIUM  7.9*   GFR: Estimated Creatinine Clearance: 40.6 mL/min (A) (by C-G formula based on SCr of 1.12 mg/dL (H)). Liver  Function Tests: Recent Labs  Lab 12/15/23 1240  AST 25  ALT 27  ALKPHOS 181*  BILITOT 1.0  PROT 5.9*  ALBUMIN  2.0*   No results for input(s): LIPASE, AMYLASE in the last 168 hours. No results for input(s): AMMONIA in the last 168 hours. Coagulation Profile: No results for input(s): INR, PROTIME in the last 168 hours. Cardiac Enzymes: No results for input(s): CKTOTAL, CKMB, CKMBINDEX, TROPONINI in the last 168 hours. BNP (last 3 results) No results for input(s): PROBNP in the last 8760 hours. HbA1C: No results for input(s): HGBA1C in the last 72 hours. CBG: No results for input(s): GLUCAP in the last 168 hours. Lipid Profile: No results  for input(s): CHOL, HDL, LDLCALC, TRIG, CHOLHDL, LDLDIRECT in the last 72 hours. Thyroid  Function Tests: No results for input(s): TSH, T4TOTAL, FREET4, T3FREE, THYROIDAB in the last 72 hours. Anemia Panel: No results for input(s): VITAMINB12, FOLATE, FERRITIN, TIBC, IRON , RETICCTPCT in the last 72 hours. Urine analysis:    Component Value Date/Time   COLORURINE YELLOW 07/15/2023 1616   APPEARANCEUR HAZY (A) 07/15/2023 1616   LABSPEC >1.046 (H) 07/15/2023 1616   PHURINE 5.0 07/15/2023 1616   GLUCOSEU NEGATIVE 07/15/2023 1616   HGBUR NEGATIVE 07/15/2023 1616   BILIRUBINUR NEGATIVE 07/15/2023 1616   BILIRUBINUR 1+ 01/30/2021 1450   KETONESUR 5 (A) 07/15/2023 1616   PROTEINUR NEGATIVE 07/15/2023 1616   UROBILINOGEN negative (A) 01/30/2021 1450   UROBILINOGEN 0.2 11/28/2019 1424   NITRITE NEGATIVE 07/15/2023 1616   LEUKOCYTESUR NEGATIVE 07/15/2023 1616    Radiological Exams on Admission: CT Angio Chest PE W and/or Wo Contrast Result Date: 12/15/2023 CLINICAL DATA:  Pulmonary embolism, high probability suspicion EXAM: CT ANGIOGRAPHY CHEST WITH CONTRAST TECHNIQUE: Multidetector CT imaging of the chest was performed using the standard protocol during bolus administration of intravenous contrast. Multiplanar CT image reconstructions and MIPs were obtained to evaluate the vascular anatomy. RADIATION DOSE REDUCTION: This exam was performed according to the departmental dose-optimization program which includes automated exposure control, adjustment of the mA and/or kV according to patient size and/or use of iterative reconstruction technique. CONTRAST:  75mL OMNIPAQUE  IOHEXOL  350 MG/ML SOLN COMPARISON:  Chest radiograph December 15, 2023 FINDINGS: Cardiovascular: Opacification of the pulmonary arteries to the segmental level with some overlying artifact. For example nonobstructive filling defects involving the subsegmental arteries of right lower lobe possibly artifactual  (3/81).No definite evidence of large pulmonary embolism. Normal heart size. No pericardial effusion. Atherosclerotic calcifications of coronary arteries. Mediastinum/Nodes: No enlarged mediastinal, hilar, or axillary lymph nodes. Thyroid  gland, trachea, and esophagus demonstrate no significant findings. Lungs/Pleura: Multifocal areas of ground-glass attenuation, interlobular septal thickening, bronchiectasis and bronchiolectasis predominantly involving the bilateral upper lobes. Trace left small pleural effusion/thickening. No honeycombing or otherwise architectural distortion. Upper Abdomen: Small splenule.  Hiatal hernia. Musculoskeletal: Multilevel degenerative changes of the spine. Left shoulder arthroplasty. Review of the MIP images confirms the above findings. IMPRESSION: No definite findings to pulmonary embolism. Faint linear filling defects superimposing the right lower lobe subsegmental pulmonary arteries likely artifactual. Diffuse crazy paving appearance of the lung fields which can be seen the setting of pulmonary edema, acute respiratory distress syndrome, multifocal infection/inflammation (pneumonia including COVID-19) and pulmonary alveolar proteinosis. Correlate with clinical findings. Atherosclerotic calcifications of coronary arteries.  Left shoulder Electronically Signed   By: Megan  Zare M.D.   On: 12/15/2023 16:45   CT ABDOMEN PELVIS W CONTRAST Result Date: 12/15/2023 CLINICAL DATA:  Abdominal pain. Midline abdominal lucency on same day chest radiograph EXAM: CT ABDOMEN AND  PELVIS WITH CONTRAST TECHNIQUE: Multidetector CT imaging of the abdomen and pelvis was performed using the standard protocol following bolus administration of intravenous contrast. RADIATION DOSE REDUCTION: This exam was performed according to the departmental dose-optimization program which includes automated exposure control, adjustment of the mA and/or kV according to patient size and/or use of iterative reconstruction  technique. CONTRAST:  75mL OMNIPAQUE  IOHEXOL  350 MG/ML SOLN COMPARISON:  Same day chest radiograph, CT abdomen and pelvis dated 07/15/2023 FINDINGS: Lower chest: Please see separately dictated CTA chest report for detailed findings. Hepatobiliary: No focal hepatic lesions. No intra or extrahepatic biliary ductal dilation. Normal gallbladder. Pancreas: No focal lesions or main ductal dilation. Spleen: Normal in size without focal abnormality. Adrenals/Urinary Tract: No adrenal nodules. No suspicious renal mass, calculi or hydronephrosis. Bilateral renal cysts. Punctate nonobstructing right renal stones. No hydronephrosis. Small volume layering hyperdensity within the urinary bladder, likely excreted contrast. Stomach/Bowel: Normal appearance of the stomach. Left lower quadrant colostomy. No evidence of bowel wall thickening, distention, or inflammatory changes. Colonic diverticulosis without acute diverticulitis. Appendix is not discretely seen. Vascular/Lymphatic: Aortic atherosclerosis. No enlarged abdominal or pelvic lymph nodes. Reproductive: No adnexal masses. Other: No free fluid, fluid collection, or free air. Musculoskeletal: No acute or abnormal lytic or blastic osseous lesions. Multilevel degenerative changes of the partially imaged thoracic and lumbar spine. Partially imaged proximal left femoral fixation hardware appears intact. Thinning of the subcutaneous soft tissues overlying the distal sacrococcygeal spine. IMPRESSION: 1. No acute intra-abdominal or intrapelvic abnormality. No pneumoperitoneum. 2. Thinning of the subcutaneous soft tissues overlying the distal sacrococcygeal spine, which may be related to decubitus ulcer. 3. Punctate nonobstructing right renal stones. 4. Colonic diverticulosis without acute diverticulitis. 5. Aortic Atherosclerosis (ICD10-I70.0). Electronically Signed   By: Limin  Xu M.D.   On: 12/15/2023 16:35   DG Chest Portable 1 View Result Date: 12/15/2023 CLINICAL DATA:   Shortness of breath EXAM: PORTABLE CHEST 1 VIEW COMPARISON:  X-ray 08/12/2023.  CTA 11/13/2023 FINDINGS: Overlapping cardiac leads. Left sided shoulder arthroplasty. Right-sided PICC catheter in place. Tip seen as far as the central SVC. Normal cardiopericardial silhouette. There is increasing interstitial and parenchymal opacity seen in both lungs compared to previous. Question tiny effusions. No pneumothorax. In addition there is some lucency beneath the diaphragm. This is of uncertain etiology. Some free air is not excluded. Please correlate with symptoms and history and if needed dedicated abdominal imaging when clinically appropriate Critical Value/emergent results were called by telephone at the time of interpretation on 12/15/2023 at 1:12 pm to provider ADAM CURATOLO , who verbally acknowledged these results. IMPRESSION: Increasing bilateral parenchymal interstitial opacities. Question tiny effusions. Lucency beneath the diaphragm in the midline. Uncertain etiology but some free air is difficult to completely exclude. Please correlate with clinical presentation and dedicated abdominal images with other x-ray or CT to confirm etiology. Electronically Signed   By: Ranell Bring M.D.   On: 12/15/2023 13:13      Assessment/Plan   Active Problems:   CAP (community acquired pneumonia)   ***            ***                  ***                   ***                  ***                  ***                  ***                   ***                  ***                  ***                  ***                  ***                 ***                ***  DVT prophylaxis: SCD's ***  Code Status: Full code*** Family Communication: none*** Disposition Plan: Per Rounding  Team Consults called: none***;  Admission status: ***     I SPENT GREATER THAN 75 *** MINUTES IN CLINICAL CARE TIME/MEDICAL DECISION-MAKING IN COMPLETING THIS ADMISSION.      Eva NOVAK Caretha Rumbaugh DO [facility]  From 7PM - 7AM   12/15/2023, 7:25 PM   ***

## 2023-12-15 NOTE — ED Notes (Signed)
 Called and placed PT on monitor with CCMD.

## 2023-12-15 NOTE — ED Triage Notes (Signed)
 Pt here for [facility] this morning, wears 3L all the time. 92% on 3L. Denies CP. C/O throat pain.

## 2023-12-15 NOTE — ED Provider Notes (Signed)
 [place] EMERGENCY DEPARTMENT AT [facility] Provider Note   CSN: [id number] Arrival date & time: 12/15/23  1135     Patient presents with: Shortness of Breath   [name] is a 83 y.o. female.   Patient is here with sore throat ongoing shortness of breath.  She is coming from rehab facility.  She is currently there after she had an infected shoulder and on IV antibiotics.  She denies any fever or chills.  Mild cough.  Her main issue is her sore throat.  She denies any chest pain weakness numbness tingling.  No nausea vomiting no abdominal pain.  Facility sent here for further evaluation.  She has chronic respiratory failure on 2 L of oxygen  at baseline.  She is on anticoagulation for blood clot history.  She feels like she has a viral process.  She has history of heart failure COPD rheumatoid arthritis.  The history is provided by the patient.       Prior to Admission medications   Medication Sig Start Date End Date Taking? Authorizing Provider  acetaminophen  (TYLENOL ) 500 MG tablet Take 1,000 mg by mouth every 8 (eight) hours as needed (pain).    [provider]  albuterol  (VENTOLIN  HFA) 108 (90 Base) MCG/ACT inhaler Inhale 2 puffs into the lungs in the morning and at bedtime.    [provider]  apixaban  (ELIQUIS ) 2.5 MG TABS tablet Take 1 tablet (2.5 mg total) by mouth 2 (two) times daily. For postoperative DVT prophylaxis 11/17/23 12/17/23  McBane, Caroline N, PA-C  atorvastatin  (LIPITOR) 10 MG tablet Take 1 tablet (10 mg total) by mouth daily. 08/14/22   Mercer Clotilda SAUNDERS, MD  bisacodyl  (DULCOLAX) 10 MG suppository Place 10 mg rectally daily as needed (bowel management).    [provider]  Cholecalciferol  (VITAMIN D3) 50 MCG (2000 UT) TABS Take 2,000 Units by mouth daily.    [provider]  daptomycin  (CUBICIN ) IVPB Inject 700 mg into the vein daily. Indication:  MRSA Bacteremia/septic arthritis of shoulder First Dose: Yes Last  Day of Therapy:  12/27/23 Labs - Once weekly:  CBC/D, BMP, and CPK Labs - Once weekly: ESR and CRP 11/22/23 12/31/23  Darci Pore, MD  diclofenac  Sodium (VOLTAREN ) 1 % GEL Apply 2 g topically 3 (three) times daily. 08/04/23   Angiulli, Toribio PARAS, PA-C  DULoxetine  (CYMBALTA ) 60 MG capsule Take 1 capsule (60 mg total) by mouth daily. 08/05/23   Angiulli, Toribio PARAS, PA-C  feeding supplement (ENSURE PLUS HIGH PROTEIN) LIQD Take 237 mLs by mouth 2 (two) times daily between meals. 11/22/23   Darci Pore, MD  furosemide  (LASIX ) 40 MG tablet Take 1 tablet (40 mg total) by mouth daily. 08/05/23   Angiulli, Toribio PARAS, PA-C  gabapentin  (NEURONTIN ) 300 MG capsule Take 1 capsule (300 mg total) by mouth 2 (two) times daily. 08/04/23   Angiulli, Toribio PARAS, PA-C  guaiFENesin  (MUCINEX ) 600 MG 12 hr tablet Take 2 tablets (1,200 mg total) by mouth 2 (two) times daily. 08/04/23   Angiulli, Toribio PARAS, PA-C  hyoscyamine  (LEVSIN  SL) 0.125 MG SL tablet Place 1 tablet (0.125 mg total) under the tongue every 8 (eight) hours as needed. 11/04/23   Zehr, Jessica D, PA-C  leptospermum manuka honey (MEDIHONEY) PSTE paste Apply 1 Application topically daily. Interchangeable with TheraHoney Apply thin layer (3 mm) to wound. Patient not taking: Reported on 12/09/2023 08/15/23   Rai, Nydia POUR, MD  levothyroxine  (SYNTHROID ) 50 MCG tablet Take 1 tablet (50 mcg  total) by mouth daily. 11/28/22   Mercer Clotilda SAUNDERS, MD  lidocaine  (LIDODERM ) 5 % Place 1 patch onto the skin daily. Remove & Discard patch within 12 hours or as directed by MD 11/09/23   Debby Fidela CROME, NP  methocarbamol  (ROBAXIN ) 500 MG tablet Take 1 tablet (500 mg total) by mouth every 6 (six) hours as needed for muscle spasms (pain). 08/04/23   Angiulli, Toribio PARAS, PA-C  metoprolol  tartrate (LOPRESSOR ) 25 MG tablet Take 2 tablets (50 mg total) by mouth 2 (two) times daily. 11/22/23   Darci Pore, MD  morphine  (MS CONTIN ) 15 MG 12 hr tablet Take 1 tablet (15 mg total)  by mouth every 12 (twelve) hours. 11/22/23   Darci Pore, MD  Multiple Vitamin (MULTIVITAMIN) tablet Take 1 tablet by mouth daily.    [provider]  nitroGLYCERIN  (NITROSTAT ) 0.4 MG SL tablet Place 1 tablet (0.4 mg total) under the tongue every 5 (five) minutes as needed for chest pain. 08/04/23   Angiulli, Toribio PARAS, PA-C  oxyCODONE  (OXY IR/ROXICODONE ) 5 MG immediate release tablet Take 1 pills every 6 hrs as needed for severe breakthrough pain 11/17/23   McBane, Caroline N, PA-C  OXYGEN  Inhale 3 L/min into the lungs at bedtime. As needed during day time    [provider]  pantoprazole  (PROTONIX ) 40 MG tablet Take 1 tablet (40 mg total) by mouth 2 (two) times daily. 06/23/23   Mercer Clotilda SAUNDERS, MD  potassium chloride  SA (KLOR-CON  M) 20 MEQ tablet Take 2 tablets (40 mEq total) by mouth 2 (two) times daily. 11/22/23   Darci Pore, MD  predniSONE  (DELTASONE ) 10 MG tablet Take 1 tablet (10 mg total) by mouth daily with breakfast. 10/15/23   Mercer Clotilda SAUNDERS, MD  sulfamethoxazole -trimethoprim  (BACTRIM  DS) 800-160 MG tablet Take 2 tablets by mouth 2 (two) times daily. 12/09/23   Fleeta Kathie Jomarie LOISE, MD    Allergies: Codeine, Remicade [infliximab], Zestril [lisinopril], and Ofev  [nintedanib]    Review of Systems  Updated Vital Signs BP (!) 144/93   Pulse 88   Temp 98.6 F (37 C)   Resp 18   Ht 5' 4 (1.626 m)   Wt 83.9 kg   SpO2 95%   BMI 31.76 kg/m   Physical Exam Vitals and nursing note reviewed.  Constitutional:      General: She is not in acute distress.    Appearance: She is well-developed.  HENT:     Head: Normocephalic and atraumatic.     Mouth/Throat:     Mouth: Mucous membranes are moist.  Eyes:     Extraocular Movements: Extraocular movements intact.     Conjunctiva/sclera: Conjunctivae normal.     Pupils: Pupils are equal, round, and reactive to light.  Cardiovascular:     Rate and Rhythm: Normal rate and regular rhythm.     Pulses: Normal  pulses.     Heart sounds: Normal heart sounds. No murmur heard. Pulmonary:     Effort: Pulmonary effort is normal. No respiratory distress.     Breath sounds: Normal breath sounds. No decreased breath sounds.  Abdominal:     Palpations: Abdomen is soft.     Tenderness: There is no abdominal tenderness.  Musculoskeletal:        General: No swelling.     Cervical back: Normal range of motion and neck supple.     Right lower leg: No edema.     Left lower leg: No edema.  Skin:    General: Skin  is warm and dry.     Capillary Refill: Capillary refill takes less than 2 seconds.  Neurological:     General: No focal deficit present.     Mental Status: She is alert.  Psychiatric:        Mood and Affect: Mood normal.     (all labs ordered are listed, but only abnormal results are displayed) Labs Reviewed  CBC WITH DIFFERENTIAL/PLATELET - Abnormal; Notable for the following components:      Result Value   WBC 16.3 (*)    RBC 3.41 (*)    Hemoglobin 9.8 (*)    HCT 32.3 (*)    RDW 16.0 (*)    Neutro Abs 13.5 (*)    Abs Immature Granulocytes 0.15 (*)    All other components within normal limits  RESP PANEL BY RT-PCR (RSV, FLU A&B, COVID)  [id number]  GROUP A STREP BY PCR  COMPREHENSIVE METABOLIC PANEL WITH GFR  BRAIN NATRIURETIC PEPTIDE  TROPONIN I (HIGH SENSITIVITY)  TROPONIN I (HIGH SENSITIVITY)    EKG: EKG Interpretation Date/Time:  Tuesday December 15 2023 11:59:27 EDT Ventricular Rate:  91 PR Interval:  145 QRS Duration:  90 QT Interval:  390 QTC Calculation: 480 R Axis:   -34  Text Interpretation: Sinus rhythm Left axis deviation Abnormal R-wave progression, early transition Confirmed by Ruthe Cornet [phone]) on 12/15/2023 12:05:19 PM  Radiology: [facility] Chest Portable 1 View Result Date: 12/15/2023 CLINICAL DATA:  Shortness of breath EXAM: PORTABLE CHEST 1 VIEW COMPARISON:  X-ray 08/12/2023.  CTA 11/13/2023 FINDINGS: Overlapping cardiac leads. Left sided shoulder arthroplasty.  Right-sided PICC catheter in place. Tip seen as far as the central SVC. Normal cardiopericardial silhouette. There is increasing interstitial and parenchymal opacity seen in both lungs compared to previous. Question tiny effusions. No pneumothorax. In addition there is some lucency beneath the diaphragm. This is of uncertain etiology. Some free air is not excluded. Please correlate with symptoms and history and if needed dedicated abdominal imaging when clinically appropriate Critical Value/emergent results were called by telephone at the time of interpretation on 12/15/2023 at 1:12 pm to provider Makalya Nave , who verbally acknowledged these results. IMPRESSION: Increasing bilateral parenchymal interstitial opacities. Question tiny effusions. Lucency beneath the diaphragm in the midline. Uncertain etiology but some free air is difficult to completely exclude. Please correlate with clinical presentation and dedicated abdominal images with other x-ray or CT to confirm etiology. Electronically Signed   By: Ranell Bring M.D.   On: 12/15/2023 13:13     Procedures   Medications Ordered in the ED - No data to display                                  Medical Decision Making Amount and/or Complexity of Data Reviewed Labs: ordered. Radiology: ordered.   [name] is here with sore throat shortness of breath viral symptoms.  She is on her stable 3 L of oxygen .  She is currently at rehab facility getting IV antibiotics/daptomycin  for left shoulder hardware infection.  She has been doing well.  She denies any fever.  She felt some viral symptoms here the last day or 2.  Facility sent her for evaluation.  She denies any fever chest pain weakness numbness tingling.  Nothing makes it worse or better.  She states her main concern is her sore throat.  On throat exam fairly unremarkable.  Maybe a little bit of erythema  but mostly looks dry.  She has a history of COPD on 3 L of oxygen , on IV daptomycin   currently history of rheumatoid arthritis asthma CKD.  Differential diagnosis could be viral process but will evaluate for ACS pneumonia strep throat volume overload.  Will get chest x-ray CBC CMP BNP troponin strep test COVID flu RSV test.  EKG shows sinus rhythm.  No ischemic changes.  Overall chest x-ray shows some increasing bilateral interstitial opacities.  Question tiny effusions.  Radiology called me on the phone and they thought maybe there was some lucency beneath the diaphragm in the midline.  Could be free air but could be artifact.  The recommended CT scan.  Will get a CT scan of her chest abdomen and pelvis.  She did spend some time off anticoagulation due to recent surgery.  Will recheck for PE volume overload intra-abdominal process.  Less far lab work with a white count of 16.  Hemoglobin is 9.8.  Patient still pending CMP BNP and troponin.   This chart was dictated using voice recognition software.  Despite best efforts to proofread,  errors can occur which can change the documentation meaning.      Final diagnoses:  SOB (shortness of breath)  Sore throat    ED Discharge Orders     None          Ruthe Cornet, DO 12/15/23 1448

## 2023-12-16 ENCOUNTER — Encounter (HOSPITAL_COMMUNITY): Payer: Self-pay | Admitting: Internal Medicine

## 2023-12-16 DIAGNOSIS — R000 Tachycardia, unspecified: Secondary | ICD-10-CM | POA: Diagnosis not present

## 2023-12-16 DIAGNOSIS — J189 Pneumonia, unspecified organism: Secondary | ICD-10-CM | POA: Diagnosis not present

## 2023-12-16 DIAGNOSIS — D638 Anemia in other chronic diseases classified elsewhere: Secondary | ICD-10-CM | POA: Diagnosis present

## 2023-12-16 DIAGNOSIS — Z8709 Personal history of other diseases of the respiratory system: Secondary | ICD-10-CM

## 2023-12-16 LAB — GLUCOSE, CAPILLARY
Glucose-Capillary: 91 mg/dL (ref 70–99)
Glucose-Capillary: 85 mg/dL (ref 70–99)
Glucose-Capillary: 63 mg/dL — ABNORMAL LOW (ref 70–99)
Glucose-Capillary: 144 mg/dL — ABNORMAL HIGH (ref 70–99)
Glucose-Capillary: 102 mg/dL — ABNORMAL HIGH (ref 70–99)

## 2023-12-16 LAB — COMPREHENSIVE METABOLIC PANEL WITH GFR
ALT: 26 U/L (ref 0–44)
AST: 21 U/L (ref 15–41)
Albumin: 2 g/dL — ABNORMAL LOW (ref 3.5–5.0)
Alkaline Phosphatase: 152 U/L — ABNORMAL HIGH (ref 38–126)
Anion gap: 10 (ref 5–15)
BUN: 25 mg/dL — ABNORMAL HIGH (ref 8–23)
CO2: 26 mmol/L (ref 22–32)
Calcium: 8.2 mg/dL — ABNORMAL LOW (ref 8.9–10.3)
Chloride: 102 mmol/L (ref 98–111)
Creatinine, Ser: 1 mg/dL (ref 0.44–1.00)
GFR, Estimated: 56 mL/min — ABNORMAL LOW (ref >60)
Glucose, Bld: 92 mg/dL (ref 70–99)
Potassium: 3.6 mmol/L (ref 3.5–5.1)
Sodium: 138 mmol/L (ref 135–145)
Total Bilirubin: 0.7 mg/dL (ref 0.0–1.2)
Total Protein: 5.1 g/dL — ABNORMAL LOW (ref 6.5–8.1)

## 2023-12-16 LAB — CREATININE, URINE, RANDOM: Creatinine, Urine: 106 mg/dL

## 2023-12-16 LAB — CBC WITH DIFFERENTIAL/PLATELET
Abs Immature Granulocytes: 0.07 K/uL (ref 0.00–0.07)
Basophils Absolute: 0.1 K/uL (ref 0.0–0.1)
Basophils Relative: 0 %
Eosinophils Absolute: 0.3 K/uL (ref 0.0–0.5)
Eosinophils Relative: 2 %
HCT: 30.3 % — ABNORMAL LOW (ref 36.0–46.0)
Hemoglobin: 9.5 g/dL — ABNORMAL LOW (ref 12.0–15.0)
Immature Granulocytes: 0 %
Lymphocytes Relative: 13 %
Lymphs Abs: 2.2 K/uL (ref 0.7–4.0)
MCH: 28.5 pg (ref 26.0–34.0)
MCHC: 31.4 g/dL (ref 30.0–36.0)
MCV: 91 fL (ref 80.0–100.0)
Monocytes Absolute: 1 K/uL (ref 0.1–1.0)
Monocytes Relative: 6 %
Neutro Abs: 13.6 K/uL — ABNORMAL HIGH (ref 1.7–7.7)
Neutrophils Relative %: 79 %
Platelets: 243 K/uL (ref 150–400)
RBC: 3.33 MIL/uL — ABNORMAL LOW (ref 3.87–5.11)
RDW: 15.9 % — ABNORMAL HIGH (ref 11.5–15.5)
WBC: 17.2 K/uL — ABNORMAL HIGH (ref 4.0–10.5)
nRBC: 0 % (ref 0.0–0.2)

## 2023-12-16 LAB — CBC
HCT: 29.3 % — ABNORMAL LOW (ref 36.0–46.0)
Hemoglobin: 9.1 g/dL — ABNORMAL LOW (ref 12.0–15.0)
MCH: 28.8 pg (ref 26.0–34.0)
MCHC: 31.1 g/dL (ref 30.0–36.0)
MCV: 92.7 fL (ref 80.0–100.0)
Platelets: 241 K/uL (ref 150–400)
RBC: 3.16 MIL/uL — ABNORMAL LOW (ref 3.87–5.11)
RDW: 15.9 % — ABNORMAL HIGH (ref 11.5–15.5)
WBC: 16.7 K/uL — ABNORMAL HIGH (ref 4.0–10.5)
nRBC: 0 % (ref 0.0–0.2)

## 2023-12-16 LAB — PATHOLOGIST SMEAR REVIEW

## 2023-12-16 LAB — URINALYSIS, COMPLETE (UACMP) WITH MICROSCOPIC: RBC / HPF: >50 RBC/hpf (ref 0–5)

## 2023-12-16 LAB — STREP PNEUMONIAE URINARY ANTIGEN: Strep Pneumo Urinary Antigen: NEGATIVE

## 2023-12-16 LAB — TSH: TSH: 0.87 u[IU]/mL (ref 0.350–4.500)

## 2023-12-16 LAB — MAGNESIUM: Magnesium: 1.9 mg/dL (ref 1.7–2.4)

## 2023-12-16 LAB — TROPONIN I (HIGH SENSITIVITY): Troponin I (High Sensitivity): 25 ng/L — ABNORMAL HIGH (ref <18)

## 2023-12-16 LAB — CK: Total CK: 184 U/L (ref 38–234)

## 2023-12-16 LAB — SODIUM, URINE, RANDOM: Sodium, Ur: 47 mmol/L

## 2023-12-16 LAB — PHOSPHORUS: Phosphorus: 3.1 mg/dL (ref 2.5–4.6)

## 2023-12-16 LAB — PROCALCITONIN: Procalcitonin: 0.42 ng/mL

## 2023-12-16 MED ORDER — DEXTROSE 50 % IV SOLN
12.5000 g | INTRAVENOUS | Status: AC
  Administered 2023-12-16: 12.5 g via INTRAVENOUS
  Filled 2023-12-16: qty 50

## 2023-12-16 MED ORDER — METOPROLOL TARTRATE 5 MG/5ML IV SOLN
5.0000 mg | Freq: Once | INTRAVENOUS | Status: AC
  Administered 2023-12-16: 5 mg via INTRAVENOUS
  Filled 2023-12-16: qty 5

## 2023-12-16 MED ORDER — METOPROLOL TARTRATE 5 MG/5ML IV SOLN
INTRAVENOUS | Status: AC
  Filled 2023-12-16: qty 5

## 2023-12-16 MED ORDER — APIXABAN 2.5 MG PO TABS
2.5000 mg | ORAL_TABLET | Freq: Two times a day (BID) | ORAL | Status: AC
  Administered 2023-12-16 – 2023-12-17 (×4): 2.5 mg via ORAL
  Filled 2023-12-16 (×4): qty 1

## 2023-12-16 MED ORDER — SODIUM CHLORIDE 0.9 % IV SOLN: INTRAVENOUS | Status: DC

## 2023-12-16 MED ORDER — INSULIN ASPART 100 UNIT/ML IJ SOLN: 0.0000 [IU] | Freq: Three times a day (TID) | INTRAMUSCULAR | Status: DC

## 2023-12-16 MED ORDER — OXYCODONE HCL 5 MG PO TABS
5.0000 mg | ORAL_TABLET | Freq: Four times a day (QID) | ORAL | Status: DC | PRN
  Administered 2023-12-16 – 2023-12-19 (×7): 5 mg via ORAL
  Filled 2023-12-16 (×7): qty 1

## 2023-12-16 MED ORDER — DAPTOMYCIN IV (FOR PTA / DISCHARGE USE ONLY): 700.0000 mg | INTRAVENOUS | Status: DC

## 2023-12-16 MED ORDER — ATORVASTATIN CALCIUM 10 MG PO TABS
10.0000 mg | ORAL_TABLET | Freq: Every day | ORAL | Status: DC
  Administered 2023-12-16 – 2023-12-23 (×8): 10 mg via ORAL
  Filled 2023-12-16 (×8): qty 1

## 2023-12-16 MED ORDER — ENOXAPARIN SODIUM 40 MG/0.4ML IJ SOSY
40.0000 mg | PREFILLED_SYRINGE | INTRAMUSCULAR | Status: DC
  Administered 2023-12-18 – 2023-12-23 (×6): 40 mg via SUBCUTANEOUS
  Filled 2023-12-16 (×7): qty 0.4

## 2023-12-16 MED ORDER — SODIUM CHLORIDE 0.9 % IV BOLUS
500.0000 mL | Freq: Once | INTRAVENOUS | Status: AC
  Administered 2023-12-16: 500 mL via INTRAVENOUS

## 2023-12-16 MED ORDER — INSULIN ASPART 100 UNIT/ML IJ SOLN: 0.0000 [IU] | Freq: Every day | INTRAMUSCULAR | Status: DC

## 2023-12-16 MED ORDER — CHLORHEXIDINE GLUCONATE CLOTH 2 % EX PADS
6.0000 | MEDICATED_PAD | Freq: Every day | CUTANEOUS | Status: DC
  Administered 2023-12-16 – 2023-12-22 (×5): 6 via TOPICAL

## 2023-12-16 MED ORDER — SODIUM CHLORIDE 0.9% FLUSH
10.0000 mL | Freq: Two times a day (BID) | INTRAVENOUS | Status: DC
  Administered 2023-12-16: 20 mL
  Administered 2023-12-16: 30 mL

## 2023-12-16 MED ORDER — LEVOTHYROXINE SODIUM 50 MCG PO TABS
50.0000 ug | ORAL_TABLET | Freq: Every day | ORAL | Status: DC
  Administered 2023-12-16 – 2023-12-23 (×7): 50 ug via ORAL
  Filled 2023-12-16 (×7): qty 1

## 2023-12-16 MED ORDER — SODIUM CHLORIDE 0.9% FLUSH: 10.0000 mL | INTRAVENOUS | Status: DC | PRN

## 2023-12-16 MED ORDER — DAPTOMYCIN-SODIUM CHLORIDE 700-0.9 MG/100ML-% IV SOLN
700.0000 mg | Freq: Every day | INTRAVENOUS | Status: DC
  Administered 2023-12-16: 700 mg via INTRAVENOUS
  Filled 2023-12-16 (×2): qty 100

## 2023-12-16 MED ORDER — PANTOPRAZOLE SODIUM 40 MG PO TBEC
40.0000 mg | DELAYED_RELEASE_TABLET | Freq: Two times a day (BID) | ORAL | Status: DC
  Administered 2023-12-16 – 2023-12-23 (×15): 40 mg via ORAL
  Filled 2023-12-16 (×15): qty 1

## 2023-12-16 MED ORDER — METOPROLOL TARTRATE 50 MG PO TABS
100.0000 mg | ORAL_TABLET | Freq: Two times a day (BID) | ORAL | Status: DC
  Administered 2023-12-16 – 2023-12-23 (×15): 100 mg via ORAL
  Filled 2023-12-16: qty 1
  Filled 2023-12-16 (×5): qty 2
  Filled 2023-12-16: qty 1
  Filled 2023-12-16 (×8): qty 2

## 2023-12-16 MED ORDER — AZITHROMYCIN 500 MG PO TABS
500.0000 mg | ORAL_TABLET | Freq: Every day | ORAL | Status: DC
  Administered 2023-12-16: 500 mg via ORAL
  Filled 2023-12-16: qty 1

## 2023-12-16 MED ORDER — METOPROLOL TARTRATE 50 MG PO TABS
50.0000 mg | ORAL_TABLET | Freq: Two times a day (BID) | ORAL | Status: DC
Start: 2023-12-16 — End: 2023-12-16
  Administered 2023-12-16: 50 mg via ORAL
  Filled 2023-12-16: qty 1

## 2023-12-16 MED ORDER — CLONIDINE HCL 0.1 MG PO TABS
0.1000 mg | ORAL_TABLET | Freq: Three times a day (TID) | ORAL | Status: DC
  Administered 2023-12-16 – 2023-12-23 (×20): 0.1 mg via ORAL
  Filled 2023-12-16 (×20): qty 1

## 2023-12-16 NOTE — Progress Notes (Signed)
 Her heart rate around 0625 suddenly went into the 140s. No palpatations. BP 165/88. Just gave IV metoprolol  5 mg. HR going down slowly.  EKG showed not in A Fib.  Informed physician.  Kristene Sotero BRAVO, RN

## 2023-12-16 NOTE — TOC Initial Note (Signed)
 Transition of Care [facility]) - Initial/Assessment Note    Patient Details  Name: [name] MRN: [id number] Date of Birth: August 27, 1940  Transition of Care [facility]) CM/SW Contact:    Montie LOISE Louder, LCSW Phone Number: 12/16/2023, 5:35 PM  Clinical Narrative:                  CSW met with patient at bedside. CSW introduced self and explained role. Patient states she is form [facility] - agreeable to returning  once stable.   TOC will continue to follow and assist with discharge planning.  Montie Louder, MSW, LCSW Clinical Social Worker    Expected Discharge Plan: Skilled Nursing Facility Barriers to Discharge: Continued Medical Work up   Patient Goals and CMS Choice            Expected Discharge Plan and Services                                              Prior Living Arrangements/Services                       Activities of Daily Living      Permission Sought/Granted                  Emotional Assessment Appearance:: Appears stated age Attitude/Demeanor/Rapport: Engaged Affect (typically observed): Accepting, Appropriate, Pleasant Orientation: : Oriented to Self, Oriented to Place, Oriented to  Time, Oriented to Situation Alcohol  / Substance Use: Not Applicable Psych Involvement: No (comment)  Admission diagnosis:  Sore throat [J02.9] SOB (shortness of breath) [R06.02] CAP (community acquired pneumonia) [J18.9] Patient Active Problem List   Diagnosis Date Noted   History of pulmonary fibrosis 12/16/2023   History of COPD 12/16/2023   Anemia of chronic disease 12/16/2023   Hardware complicating wound infection (HCC) 12/08/2023   Therapeutic drug monitoring 11/16/2023   MRSA bacteremia 11/15/2023   Septic arthritis (HCC) 11/13/2023   SIRS (systemic inflammatory response syndrome) (HCC) 11/13/2023   CAP (community acquired pneumonia) 11/13/2023   Troponin level elevated 11/13/2023   History of GI bleed 11/13/2023    History of CAD (coronary artery disease) 11/13/2023   Stage IV pressure ulcer of sacral region (HCC) 11/13/2023   GERD (gastroesophageal reflux disease) 11/13/2023   Chronic constipation 11/13/2023   Normocytic anemia 11/06/2023   LLQ abdominal pain 11/06/2023   Pain due to onychomycosis of toenails of both feet 09/22/2023   Severe sepsis (HCC) 08/12/2023   GAD (generalized anxiety disorder) 07/24/2023   Septic shock (HCC) 07/20/2023   Type 2 diabetes mellitus with diabetic polyneuropathy, without long-term current use of insulin  (HCC) 07/20/2023   Iron  deficiency anemia 07/20/2023   HLD (hyperlipidemia) 07/20/2023   Chronic diastolic CHF (congestive heart failure) (HCC) 07/20/2023   Abdominal pain 07/16/2023   GI bleed 07/15/2023   Enterocolitis 06/30/2023   Sepsis due to undetermined organism (HCC) 06/30/2023   Hypothyroidism 06/30/2023   GERD without esophagitis 06/30/2023   Peripheral neuropathy 06/30/2023   RSV (respiratory syncytial virus pneumonia) 06/26/2023   Ileus (HCC) 06/23/2023   Lower GI bleed 12/16/2022   BRBPR (bright red blood per rectum) 12/15/2022   [name] 09/08/2022   Abnormal CT scan, sigmoid colon 04/14/2022   Colon stricture (HCC) 04/14/2022   Diverticulosis of colon without hemorrhage 04/14/2022   History of pulmonary embolus (  PE) 11/15/2021   Sinus tachycardia 10/22/2021   Pulmonary HTN (HCC)    Unspecified protein-calorie malnutrition (HCC) 04/05/2021   Pressure injury of skin 03/31/2021   Thrombocytopenia (HCC) 03/30/2021   Diabetic neuropathy (HCC) 05/24/2019   Hypokalemia 04/21/2019   Anticoagulated 06/25/2018   Chronic pain syndrome 06/25/2018   CRI (chronic renal insufficiency), stage 3 (moderate) (HCC) 06/25/2018   Drug-induced constipation 06/18/2018   DVT (deep venous thrombosis) (HCC) 06/03/2018   Chronic chest pain 06/02/2018   Primary osteoarthritis of both knees 05/26/2018   Body mass index  (BMI) of 50-59.9 in adult (HCC) 05/26/2018   Acquired hypothyroidism 05/26/2018   Chronic bilateral low back pain without sciatica 02/17/2018   Astigmatism with presbyopia, bilateral 03/26/2017   Cortical age-related cataract of both eyes 03/26/2017   Family history of glaucoma 03/26/2017   Long term current use of oral hypoglycemic drug 03/26/2017   Nuclear sclerotic cataract of both eyes 03/26/2017   Pain in left hip 02/18/2017   DM2 (diabetes mellitus, type 2) (HCC) 01/16/2017   Gout 11/29/2016   Depression 11/29/2016   Physical deconditioning 07/31/2015   Pulmonary fibrosis, postinflammatory (HCC) 07/31/2015   Bronchiectasis without complication (HCC) 07/31/2015   Tendinitis of left rotator cuff 06/13/2015   Chronic heart failure with preserved ejection fraction (HFpEF) (HCC) 04/21/2015   AKI (acute kidney injury) (HCC)    Chronic hypoxic respiratory failure (HCC) 02/09/2015   SOB (shortness of breath) 01/03/2015   Normal coronary arteries 11/02/2014   Morbid obesity (HCC) 10/30/2014   Dysphagia 08/21/2014   Debility 06/21/2014   DOE (dyspnea on exertion) 05/24/2014   Primary osteoarthritis of left knee 04/26/2014   High risk medication use 04/17/2014   Aphasia 02/12/2014   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) 01/18/2014   Lumbar facet arthropathy 01/18/2014   Bilateral edema of lower extremity 05/30/2013   Chronic cough 05/24/2013   ILD (interstitial lung disease) (HCC) 04/10/2013   Spinal stenosis of lumbar region with radiculopathy 07/12/2012   Inability to walk 07/12/2012   Meningioma of left sphenoid wing involving cavernous sinus (HCC) 02/17/2012   Rheumatoid arthritis (HCC) 01/28/2012   Primary osteoarthritis of right knee 01/28/2012   Dyslipidemia    Essential hypertension    PCP:  Mercer Clotilda SAUNDERS, MD Pharmacy:   [facility] [phone] - [place], [place] - [address] AT [facility] [address] [facility] [address] [place] [address] Phone: [phone] Fax: [phone]  [facility] - [place], [place] - [address] [place] [address] Phone: [phone] Fax: [phone]     Social Drivers of Health (SDOH) Social History: SDOH Screenings   Food Insecurity: No Food Insecurity (12/16/2023)  Housing: Low Risk  (12/16/2023)  Transportation Needs: No Transportation Needs (12/16/2023)  Utilities: Not At Risk (12/16/2023)  Alcohol  Screen: Low Risk  (10/30/2023)  Depression (PHQ2-9): Low Risk  (11/09/2023)  Recent Concern: Depression (PHQ2-9) - Medium Risk (10/15/2023)  Financial Resource Strain: Low Risk  (10/30/2023)  Physical Activity: Insufficiently Active (10/30/2023)  Social Connections: Socially Integrated (12/16/2023)  Stress: No Stress Concern Present (10/30/2023)  Tobacco Use: Low Risk  (12/16/2023)  Health Literacy: Adequate Health Literacy (10/30/2023)   SDOH Interventions:     Readmission Risk Interventions    07/17/2023    3:28 PM 07/08/2023   10:09 AM 12/16/2022   11:09 AM  Readmission Risk Prevention Plan  Transportation Screening Complete Complete Complete  PCP or Specialist Appt within 5-7  Days   Complete  Home Care Screening   Complete  Medication Review (RN CM)   Complete  Medication Review (RN Care Manager) Complete Referral to Pharmacy   PCP or Specialist appointment within 3-5 days of discharge Complete Complete   HRI or Home Care Consult Complete Complete   SW Recovery Care/Counseling Consult Complete    Palliative Care Screening Not Applicable Not Applicable   Skilled Nursing Facility Complete Complete

## 2023-12-16 NOTE — Consult Note (Signed)
 WOC Nurse ostomy consult note Patient is known to [facility] team and is independent with ostomy care.   Stoma type/location: LUQ colostomy  Treatment options for stomal/peristomal skin: stoma powder and skin prep barrier ring and 1 piece convex pouch. Will implement orders and patient and bedside RN can change pouch when needed.   Ostomy pouching: 1pc.convex with barrier ring and belt Education provided: will order needed supplies.  Treatment options for stomal/peristomal skin:  Powder #6, skin prep [id number], belt #[id number] and ring [id number] 1 piece pouch [id number] Output the bed nurse just cleaned the bag. Ostomy pouching: 1pc. Convex with belt Procedure: Clean the skin with saline, apply powder on the skin around stoma, seal with skin prep  Cut the barrier as the size and shape as the ostomy (30 mm x 45 mm) Apply barrier ring and pouch. Secure pouch with belt.  Change as needed.  Will not follow at this time.  Please re-consult if needed.  Darice Cooley MSN, RN, FNP-BC CWON Wound, Ostomy, Continence Nurse Outpatient [facility] [phone] Pager [phone]

## 2023-12-16 NOTE — Plan of Care (Signed)

## 2023-12-16 NOTE — Progress Notes (Signed)
 Patient's HR remains in 130's post IV bolus and fluid for couple hours. Dr. Sheryll notified, pt's urine was too dark brown for lab to result, MD notified and ordered to recollect in the morning. Notified MD it seemed like patient had old vaginal bleeding. O2 sat 100% in 3l o2 ia [place]. Denies any pain, slight SOB. Order for one more dose of 5mg  metoprolol  received. Orders for new labs,  Plan of care continues.

## 2023-12-16 NOTE — Progress Notes (Signed)
 [name]  FMW:[id number] DOB: 18-Dec-1940 DOA: 12/15/2023 PCP: Mercer Clotilda SAUNDERS, MD    Brief Narrative:  83 year old with a history of pulmonary fibrosis with chronic hypoxic respiratory failure on 3 L nasal cannula oxygen , COPD, MRSA septic arthritis of the left shoulder June 2025, DM2, and chronic diastolic CHF who presented to the ER 7/29 with 2 to 3 days of progressive shortness of breath and was diagnosed with community acquired pneumonia.    Goals of Care:   Code Status: Full Code   DVT prophylaxis: enoxaparin  (LOVENOX ) injection 40 mg Start: 12/18/23 1000 apixaban  (ELIQUIS ) tablet 2.5 mg Start: 12/16/23 1000 SCDs Start: 12/15/23 1921 apixaban  (ELIQUIS ) tablet 2.5 mg   Interim Hx: Afebrile since admission.  Remains tachycardic with heart rates 115-140.  Blood pressure stable.  Oxygen  saturation 89-94% on 3 L nasal cannula.  Reports feeling mildly short of breath at time of evaluation.  Denies chest pain.  Feels weak in general.  Assessment & Plan:  Multifocal community-acquired pneumonia with sepsis Continue empiric antibiotic therapy and supportive care  Persistent sinus tachycardia Etiology unclear - is receiving her usual dose of metoprolol  - no evidence of pulmonary embolism on CTa chest 7/29  -discontinue as needed and scheduled albuterol  - monitor on tele - check TSH - BP is stable - recheck Hgb    Acute kidney injury Baseline creatinine 0.75 -creatinine 1.12 at time of presentation -monitor trend with volume expansion  Chronically elevated troponin Review of old labs suggest patient has a chronic troponin elevation of 21-35  Chronic hypoxic respiratory failure due to COPD and pulmonary fibrosis Requires 3 L nasal cannula support at home at all times -presently stable on usual home dose of oxygen   Chronic diastolic CHF EF 66% with grade 1 DD via TTE June 2025 -no gross volume overload on exam  DM2 CBG well-controlled at present  HTN Continue usual home  medical therapies  HLD Continue usual atorvastatin   Hypothyroidism Continue usual Synthroid   Anemia of chronic disease Hemoglobin stable at present  Family Communication: No family present at time of exam Disposition: Unclear at present PT Orders:  Follow Up Rec:   Objective: Blood pressure (!) 143/87, pulse (!) 127, temperature 98.3 F (36.8 C), temperature source Oral, resp. rate 19, height 5' 4 (1.626 m), weight 80.8 kg, SpO2 94%.  Intake/Output Summary (Last 24 hours) at 12/16/2023 0929 Last data filed at 12/16/2023 0326 Gross per 24 hour  Intake 485.45 ml  Output 150 ml  Net 335.45 ml   Filed Weights   12/15/23 1140 12/15/23 2333  Weight: 83.9 kg 80.8 kg    Examination: General: No acute respiratory distress Lungs: Fine crackles scattered throughout with no wheezing Cardiovascular: Persistent sinus tachycardia without murmur or rub Abdomen: Nontender, nondistended, soft, bowel sounds positive, no rebound, no ascites, no appreciable mass Extremities: No significant cyanosis, clubbing, or edema bilateral lower extremities  CBC: Recent Labs  Lab 12/15/23 1240 12/16/23 0002  WBC 16.3* 17.2*  NEUTROABS 13.5* 13.6*  HGB 9.8* 9.5*  HCT 32.3* 30.3*  MCV 94.7 91.0  PLT 226 243   Basic Metabolic Panel: Recent Labs  Lab 12/15/23 1240 12/15/23 1430 12/16/23 0002  NA 137  --  138  K 4.4  --  3.6  CL 99  --  102  CO2 25  --  26  GLUCOSE 125*  --  92  BUN 25*  --  25*  CREATININE 1.12*  --  1.00  CALCIUM  7.9*  --  8.2*  MG  --  1.9 1.9  PHOS  --   --  3.1   GFR: Estimated Creatinine Clearance: 44.6 mL/min (by C-G formula based on SCr of 1 mg/dL).   Scheduled Meds:  apixaban   2.5 mg Oral BID   atorvastatin   10 mg Oral Daily   Chlorhexidine  Gluconate Cloth  6 each Topical Daily   [START ON 12/18/2023] enoxaparin  (LOVENOX ) injection  40 mg Subcutaneous Q24H   insulin  aspart  0-5 Units Subcutaneous QHS   insulin  aspart  0-9 Units Subcutaneous TID WC    ipratropium-albuterol   3 mL Nebulization Q6H   levothyroxine   50 mcg Oral Daily   metoprolol  tartrate  50 mg Oral BID   pantoprazole   40 mg Oral BID   sodium chloride  flush  10-40 mL Intracatheter Q12H   Continuous Infusions:  sodium chloride  50 mL/hr at 12/16/23 0547   azithromycin  Stopped (12/15/23 2246)   cefTRIAXone  (ROCEPHIN )  IV Stopped (12/15/23 2246)   DAPTOmycin        LOS: 1 day   Reyes IVAR Moores, MD [facility] Office  [phone] Pager - Text Page per Tracey  If 7PM-7AM, please contact night-coverage per Amion 12/16/2023, 9:29 AM

## 2023-12-16 NOTE — Progress Notes (Signed)
 Patients HR remains in 127-129 post another dose of IV metoprolol . Blood sugar 63. Refused to eat or drink, took a sip of apple juice and coke, ended up giving iv dextrose . DR. McClung notified. Recheck blood sugar 144, instructed patient and family that she needs to eat to keep her sugar up. Son is bringing her food. See MAR for new orders.

## 2023-12-16 NOTE — Progress Notes (Signed)
 Notified Dr. Danton that Pt's Hr is still in 120-130's. Patient received 5 of metoprolol  around 0645, NS running at 85ml/hr, BP-143/87, no chest pain,8am troponin  just drawn, in 3l o2 slightly SOB, o2 sat 94%. Giving her scheduled metoprolol  now. MD ordered 5mg  iv metoprolol , ok to give PO and IV together per MD. Plan of care continues.

## 2023-12-16 NOTE — Progress Notes (Signed)
 Patient staying tachycardic, after po and IV metoprolol . Dr. Danton was on the floor,notified. Please see MAR for new orders. Plan of care continues.

## 2023-12-17 ENCOUNTER — Other Ambulatory Visit: Payer: Self-pay

## 2023-12-17 ENCOUNTER — Inpatient Hospital Stay (HOSPITAL_COMMUNITY)

## 2023-12-17 DIAGNOSIS — J9621 Acute and chronic respiratory failure with hypoxia: Secondary | ICD-10-CM | POA: Diagnosis not present

## 2023-12-17 DIAGNOSIS — J189 Pneumonia, unspecified organism: Secondary | ICD-10-CM | POA: Diagnosis not present

## 2023-12-17 LAB — RESPIRATORY PANEL BY PCR

## 2023-12-17 LAB — GLUCOSE, CAPILLARY
Glucose-Capillary: 110 mg/dL — ABNORMAL HIGH (ref 70–99)
Glucose-Capillary: 121 mg/dL — ABNORMAL HIGH (ref 70–99)
Glucose-Capillary: 128 mg/dL — ABNORMAL HIGH (ref 70–99)
Glucose-Capillary: 130 mg/dL — ABNORMAL HIGH (ref 70–99)
Glucose-Capillary: 131 mg/dL — ABNORMAL HIGH (ref 70–99)
Glucose-Capillary: 77 mg/dL (ref 70–99)
Glucose-Capillary: 87 mg/dL (ref 70–99)

## 2023-12-17 LAB — CBC
HCT: 32.1 % — ABNORMAL LOW (ref 36.0–46.0)
Hemoglobin: 10 g/dL — ABNORMAL LOW (ref 12.0–15.0)
MCH: 28.8 pg (ref 26.0–34.0)
MCHC: 31.2 g/dL (ref 30.0–36.0)
MCV: 92.5 fL (ref 80.0–100.0)
Platelets: 275 K/uL (ref 150–400)
RBC: 3.47 MIL/uL — ABNORMAL LOW (ref 3.87–5.11)
RDW: 16.1 % — ABNORMAL HIGH (ref 11.5–15.5)
WBC: 17.4 K/uL — ABNORMAL HIGH (ref 4.0–10.5)
nRBC: 0 % (ref 0.0–0.2)

## 2023-12-17 LAB — COMPREHENSIVE METABOLIC PANEL WITH GFR
ALT: 25 U/L (ref 0–44)
AST: 34 U/L (ref 15–41)
Albumin: 1.9 g/dL — ABNORMAL LOW (ref 3.5–5.0)
Alkaline Phosphatase: 177 U/L — ABNORMAL HIGH (ref 38–126)
Anion gap: 11 (ref 5–15)
BUN: 18 mg/dL (ref 8–23)
CO2: 25 mmol/L (ref 22–32)
Calcium: 8.1 mg/dL — ABNORMAL LOW (ref 8.9–10.3)
Chloride: 104 mmol/L (ref 98–111)
Creatinine, Ser: 0.86 mg/dL (ref 0.44–1.00)
GFR, Estimated: >60 mL/min (ref >60)
Glucose, Bld: 99 mg/dL (ref 70–99)
Potassium: 3.6 mmol/L (ref 3.5–5.1)
Sodium: 140 mmol/L (ref 135–145)
Total Bilirubin: 0.9 mg/dL (ref 0.0–1.2)
Total Protein: 5.8 g/dL — ABNORMAL LOW (ref 6.5–8.1)

## 2023-12-17 LAB — BLOOD GAS, VENOUS
Acid-Base Excess: 2.2 mmol/L — ABNORMAL HIGH (ref 0.0–2.0)
Bicarbonate: 27.8 mmol/L (ref 20.0–28.0)
O2 Saturation: 52.5 %
Patient temperature: 37
pCO2, Ven: 46 mmHg (ref 44–60)
pH, Ven: 7.39 (ref 7.25–7.43)
pO2, Ven: 32 mmHg (ref 32–45)

## 2023-12-17 LAB — PROCALCITONIN: Procalcitonin: 0.48 ng/mL

## 2023-12-17 LAB — TROPONIN I (HIGH SENSITIVITY): Troponin I (High Sensitivity): 27 ng/L — ABNORMAL HIGH (ref <18)

## 2023-12-17 LAB — BRAIN NATRIURETIC PEPTIDE: B Natriuretic Peptide: 59.1 pg/mL (ref 0.0–100.0)

## 2023-12-17 MED ORDER — PIPERACILLIN-TAZOBACTAM 3.375 G IVPB
3.3750 g | Freq: Three times a day (TID) | INTRAVENOUS | Status: AC
  Administered 2023-12-17 – 2023-12-21 (×15): 3.375 g via INTRAVENOUS
  Filled 2023-12-17 (×15): qty 50

## 2023-12-17 MED ORDER — IPRATROPIUM BROMIDE 0.02 % IN SOLN
0.5000 mg | Freq: Four times a day (QID) | RESPIRATORY_TRACT | Status: DC
  Administered 2023-12-17 – 2023-12-20 (×11): 0.5 mg via RESPIRATORY_TRACT
  Filled 2023-12-17 (×11): qty 2.5

## 2023-12-17 MED ORDER — MUPIROCIN 2 % EX OINT
1.0000 | TOPICAL_OINTMENT | Freq: Two times a day (BID) | CUTANEOUS | Status: AC
  Administered 2023-12-17 – 2023-12-21 (×10): 1 via NASAL
  Filled 2023-12-17 (×3): qty 22

## 2023-12-17 MED ORDER — MAGNESIUM SULFATE 2 GM/50ML IV SOLN
2.0000 g | Freq: Once | INTRAVENOUS | Status: AC
  Administered 2023-12-17: 2 g via INTRAVENOUS
  Filled 2023-12-17: qty 50

## 2023-12-17 MED ORDER — FUROSEMIDE 10 MG/ML IJ SOLN
40.0000 mg | Freq: Two times a day (BID) | INTRAMUSCULAR | Status: DC
  Administered 2023-12-17 – 2023-12-18 (×2): 40 mg via INTRAVENOUS
  Filled 2023-12-17 (×2): qty 4

## 2023-12-17 MED ORDER — MIDAZOLAM HCL 2 MG/2ML IJ SOLN
INTRAMUSCULAR | Status: AC
  Administered 2023-12-17: 1 mg via INTRAVENOUS
  Filled 2023-12-17: qty 2

## 2023-12-17 MED ORDER — CHLORHEXIDINE GLUCONATE CLOTH 2 % EX PADS
6.0000 | MEDICATED_PAD | Freq: Every day | CUTANEOUS | Status: AC
  Administered 2023-12-18 – 2023-12-20 (×3): 6 via TOPICAL

## 2023-12-17 MED ORDER — MIDAZOLAM HCL 2 MG/2ML IJ SOLN: 1.0000 mg | Freq: Once | INTRAMUSCULAR | Status: AC

## 2023-12-17 MED ORDER — VANCOMYCIN HCL 750 MG/150ML IV SOLN
750.0000 mg | INTRAVENOUS | Status: DC
  Administered 2023-12-18: 750 mg via INTRAVENOUS
  Filled 2023-12-17: qty 150

## 2023-12-17 MED ORDER — VANCOMYCIN HCL IN DEXTROSE 1-5 GM/200ML-% IV SOLN
1000.0000 mg | INTRAVENOUS | Status: DC
  Administered 2023-12-17: 1000 mg via INTRAVENOUS
  Filled 2023-12-17: qty 200

## 2023-12-17 MED ORDER — METHYLPREDNISOLONE SODIUM SUCC 125 MG IJ SOLR
125.0000 mg | Freq: Once | INTRAMUSCULAR | Status: AC
  Administered 2023-12-17: 125 mg via INTRAVENOUS
  Filled 2023-12-17: qty 2

## 2023-12-17 MED ORDER — PREDNISONE 20 MG PO TABS
40.0000 mg | ORAL_TABLET | Freq: Every day | ORAL | Status: DC
  Administered 2023-12-17 – 2023-12-19 (×3): 40 mg via ORAL
  Filled 2023-12-17 (×3): qty 2

## 2023-12-17 MED ORDER — LEVALBUTEROL HCL 1.25 MG/0.5ML IN NEBU
1.2500 mg | INHALATION_SOLUTION | Freq: Once | RESPIRATORY_TRACT | Status: AC
  Administered 2023-12-17: 1.25 mg via RESPIRATORY_TRACT
  Filled 2023-12-17: qty 0.5

## 2023-12-17 MED ORDER — POTASSIUM CHLORIDE 10 MEQ/100ML IV SOLN
10.0000 meq | INTRAVENOUS | Status: AC
  Administered 2023-12-17 (×2): 10 meq via INTRAVENOUS
  Filled 2023-12-17 (×2): qty 100

## 2023-12-17 MED ORDER — INSULIN ASPART 100 UNIT/ML IJ SOLN
0.0000 [IU] | INTRAMUSCULAR | Status: DC
  Administered 2023-12-17 – 2023-12-19 (×7): 1 [IU] via SUBCUTANEOUS
  Administered 2023-12-21: 5 [IU] via SUBCUTANEOUS
  Administered 2023-12-21: 1 [IU] via SUBCUTANEOUS
  Administered 2023-12-22 – 2023-12-23 (×7): 2 [IU] via SUBCUTANEOUS
  Administered 2023-12-23: 3 [IU] via SUBCUTANEOUS
  Administered 2023-12-23: 2 [IU] via SUBCUTANEOUS

## 2023-12-17 MED ORDER — FUROSEMIDE 10 MG/ML IJ SOLN
40.0000 mg | Freq: Once | INTRAMUSCULAR | Status: AC
  Administered 2023-12-17: 40 mg via INTRAVENOUS
  Filled 2023-12-17: qty 4

## 2023-12-17 MED ORDER — DOXYCYCLINE HYCLATE 100 MG PO TABS
100.0000 mg | ORAL_TABLET | Freq: Two times a day (BID) | ORAL | Status: AC
  Administered 2023-12-17 (×2): 100 mg via ORAL
  Filled 2023-12-17 (×2): qty 1

## 2023-12-17 NOTE — Consult Note (Addendum)
 NAME:  [name], MRN:  [id number], DOB:  05/04/1941, LOS: 2 ADMISSION DATE:  12/15/2023, CONSULTATION DATE:  12/17/2023 REFERRING MD: Franky Redia SAILOR, MD, CHIEF COMPLAINT: SOB and hypoxia   History of Present Illness:  A 83 y.o. female with ILD (?chronic aspiration), chronic hypoxic resp failure on HOT @ 3 L [place] for 20 yrs, restrictive lung disease (mild), septic left shoulder, chronic diastolic heart failure (June 2025, Echo EF 60%, G1DD), ?PE (past hx), HTN, dyslipidemia, hypothyroidism, rheumatoid arthritis, debility, chronic sinus tachycardia, chronic chest pain, and DM-2, who was admitted on 12/15/2023 with progressive SOB and dry cough for 3 days before admission. She was dx with sepsis and community-acquired pneumonia, after presenting from SNF rehab.  She underwent hemiarthroplasty of the left shoulder on 11/15/2023, complicated with septic arthritis, positive for MRSA. Infectious disease was consulted, and the patient was started on daptomycin  via PICC line 11/15/2023, with course of daptomycin  to conclude with final dose to occur on 12/27/2023.   She has no f/c/r, generalized myalgias, or LL edema.   Never smoked. No alcohol  or illicit drug use.  Hospitalist called PCCM team because of wheezing, hypoxia, and being placed on BiPAP 14/8/60%, back up rate 12. IVF was d/c and given IV Lasix  40 mgx1.    Pertinent  Medical History  ILD (?chronic aspiration), chronic hypoxic resp failure on HOT @ 3 L [place] for 20 yrs, restrictive lung disease (mild), septic left shoulder, chronic diastolic heart failure (June 2025, Echo EF 60%, G1DD), ?PE (past hx), HTN, dyslipidemia, hypothyroidism, rheumatoid arthritis, debility, chronic sinus tachycardia, chronic chest pain, DM-2  Significant Hospital Events: Including procedures, antibiotic start and stop dates in addition to other pertinent events   7/31: PCCM was consulted for worsening resp status   Interim History / Subjective:    Objective     Blood pressure (!) 157/88, pulse (!) 131, temperature (!) 101.1 F (38.4 C), temperature source Axillary, resp. rate 20, height 5' 4 (1.626 m), weight 80.8 kg, SpO2 93%.    Vent Mode: BIPAP;PCV FiO2 (%):  [32 %-60 %] 60 % Set Rate:  [12 bmp] 12 bmp PEEP:  [8 cmH20] 8 cmH20   Intake/Output Summary (Last 24 hours) at 12/17/2023 0520 Last data filed at 12/17/2023 0134 Gross per 24 hour  Intake 1103.14 ml  Output 550 ml  Net 553.14 ml   Filed Weights   12/15/23 1140 12/15/23 2333  Weight: 83.9 kg 80.8 kg    Examination: General: alert, oriented x4, and restless. On BiPAP. SpO2 89%  HENT: Pupils are not equal due to previous cataract surgery. Dry oral mucosa. No LNE or thyromegaly. No JVD Lungs: symmetrical air entry bilaterally. Diffuse dry crackles. Wheezing looks like upper airway (VC source) Cardiovascular: NL S1/S2. No m/g/r Abdomen: no distension or tenderness Extremities: no edema. Symmetrical  Neuro: nonfocal   Left shoulder dressing is clean  Stage 2 bedsore   Resolved problem list   Assessment and Plan  Acute on chronic hypoxic resp failure due to aspiration PNA. D.Dx: exacerbation of ILD, viral PNA, and Daptomycin  eosinophilic pneumonia -Transfer to ICU -HFNC -d/c BiPAP -NPO -ST eval -Aspiration precautions and HOB elevation -Viral PCR -Sputum Cx -U. Ags -d/c Daptomycin   -Vanco, Zosyn , Zithromax  -Steroids -Atrovent   -I/S and CPT -PCT -May bronchoscopy and BAL  Septic left shoulder/MRSA -Vanco  Chronic diastolic heart failure (June 2025, Echo EF 60%, G1DD), HTN, dyslipidemia Chronic sinus tachycardia -Continue BB -Resume home meds -On Eliquis  for postop DVT prophylaxis   Hypothyroidism -  Continue Levothyroxine   Rheumatoid arthritis and ILD -Steroids -Atrovent  -Avoid SABA due to tachycardia  DM-2 -HbA1c -ISS    Stage 2 bedsore -Monitor -Wound care   Best Practice (right click and Reselect all SmartList Selections daily)    Diet/type: NPO w/ oral meds DVT prophylaxis DOAC Pressure ulcer(s): present on admission  GI prophylaxis: PPI Lines: yes and it is still needed Foley:  N/A Code Status:  full code Last date of multidisciplinary goals of care discussion []   Labs   CBC: Recent Labs  Lab 12/15/23 1240 12/16/23 0002 12/16/23 1655 12/17/23 0103  WBC 16.3* 17.2* 16.7* 17.4*  NEUTROABS 13.5* 13.6*  --   --   HGB 9.8* 9.5* 9.1* 10.0*  HCT 32.3* 30.3* 29.3* 32.1*  MCV 94.7 91.0 92.7 92.5  PLT 226 243 241 275    Basic Metabolic Panel: Recent Labs  Lab 12/15/23 1240 12/15/23 1430 12/16/23 0002 12/17/23 0103  NA 137  --  138 140  K 4.4  --  3.6 3.6  CL 99  --  102 104  CO2 25  --  26 25  GLUCOSE 125*  --  92 99  BUN 25*  --  25* 18  CREATININE 1.12*  --  1.00 0.86  CALCIUM  7.9*  --  8.2* 8.1*  MG  --  1.9 1.9  --   PHOS  --   --  3.1  --    GFR: Estimated Creatinine Clearance: 51.8 mL/min (by C-G formula based on SCr of 0.86 mg/dL). Recent Labs  Lab 12/15/23 1240 12/16/23 0002 12/16/23 1655 12/17/23 0103  PROCALCITON  --  0.42  --   --   WBC 16.3* 17.2* 16.7* 17.4*    Liver Function Tests: Recent Labs  Lab 12/15/23 1240 12/16/23 0002 12/17/23 0103  AST 25 21 34  ALT 27 26 25   ALKPHOS 181* 152* 177*  BILITOT 1.0 0.7 0.9  PROT 5.9* 5.1* 5.8*  ALBUMIN  2.0* 2.0* 1.9*   No results for input(s): LIPASE, AMYLASE in the last 168 hours. No results for input(s): AMMONIA in the last 168 hours.  ABG    Component Value Date/Time   PHART 7.460 (H) 03/30/2021 1638   PCO2ART 46.6 03/30/2021 1638   PO2ART 70 (L) 03/30/2021 1638   HCO3 27.8 12/17/2023 0118   TCO2 24 07/15/2023 1215   O2SAT 52.5 12/17/2023 0118     Coagulation Profile: No results for input(s): INR, PROTIME in the last 168 hours.  Cardiac Enzymes: Recent Labs  Lab 12/16/23 0002  CKTOTAL 184    HbA1C: Hemoglobin A1C  Date/Time Value Ref Range Status  10/15/2023 02:48 PM 6.0 (A) 4.0 - 5.6 %  Final   Hgb A1c MFr Bld  Date/Time Value Ref Range Status  06/14/2023 03:16 AM 5.6 4.8 - 5.6 % Final    Comment:    (NOTE) Pre diabetes:          5.7%-6.4%  Diabetes:              >6.4%  Glycemic control for   <7.0% adults with diabetes   12/16/2022 09:43 AM 5.5 4.8 - 5.6 % Final    Comment:    (NOTE)         Prediabetes: 5.7 - 6.4         Diabetes: >6.4         Glycemic control for adults with diabetes: <7.0     CBG: Recent Labs  Lab 12/16/23 1244 12/16/23 1759 12/16/23 1936 12/16/23 2132  12/17/23 0100  GLUCAP 85 63* 144* 102* 87    Review of Systems:   Review of Systems  Constitutional:  Positive for malaise/fatigue. Negative for chills, diaphoresis, fever and weight loss.  HENT:  Negative for congestion, nosebleeds, sinus pain and sore throat.   Respiratory:  Positive for cough, shortness of breath and wheezing. Negative for hemoptysis, sputum production and stridor.   Cardiovascular:  Positive for chest pain and palpitations. Negative for orthopnea, claudication, leg swelling and PND.  Gastrointestinal:  Negative for abdominal pain, diarrhea, heartburn, nausea and vomiting.  Genitourinary:  Negative for dysuria.  Skin:  Negative for itching and rash.  Neurological:  Negative for focal weakness.     Past Medical History:  She,  has a past medical history of Acute encephalopathy (03/30/2021), Acute kidney injury superimposed on chronic kidney disease (HCC) (04/17/2015), Acute on chronic diastolic (congestive) heart failure (HCC), Anemia, Anxiety, Arthritis, Asthma, Carotid artery occlusion, Chest pain of uncertain etiology (01/28/2012), Chronic fatigue, Chronic kidney disease, Closed intertrochanteric fracture of hip, left, initial encounter (HCC) (11/29/2016), Closed left hip fracture (HCC), Closed nondisplaced intertrochanteric fracture of left femur with delayed healing (01/14/2017), Clotting disorder (HCC), Contusion of left knee, COPD (chronic obstructive  pulmonary disease) (HCC), Coronary artery disease, Depression, reactive, Diabetes mellitus, Diastolic dysfunction, Difficulty in walking, Family history of heart disease, Flu (06/26/2023), Generalized muscle weakness, Gout, Hardware complicating wound infection (HCC) (12/08/2023), High cholesterol, History of falling, Hypertension, Hypothyroidism, Interstitial lung disease (HCC), 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   Surgical History:   Past Surgical History:  Procedure Laterality Date   BRAIN SURGERY     Gamma knife 10/13. Needs repeat spring  '14   CARDIAC CATHETERIZATION N/A 10/31/2014   Procedure: Right/Left Heart Cath and Coronary Angiography;  Surgeon: Candyce GORMAN Reek, MD;  Location: [facility] INVASIVE CV LAB;  Service: Cardiovascular;  Laterality: N/A;   COLONOSCOPY WITH PROPOFOL  N/A 04/14/2022   Procedure: COLONOSCOPY WITH PROPOFOL ;  Surgeon: Albertus Gordy HERO, MD;  Location: [facility] ENDOSCOPY;  Service: Gastroenterology;  Laterality: N/A;   CYST EXCISION     2 on face   CYST REMOVAL NECK     ESOPHAGOGASTRODUODENOSCOPY (EGD) WITH PROPOFOL  N/A 09/14/2014   Procedure: ESOPHAGOGASTRODUODENOSCOPY (EGD) WITH PROPOFOL ;  Surgeon: Lamar JONETTA Aho, MD;  Location: [facility] ENDOSCOPY;  Service: Endoscopy;  Laterality: N/A;   INCISION AND DRAINAGE HIP Left 01/16/2017   Procedure: IRRIGATION AND DEBRIDEMENT LEFT HIP;  Surgeon: Vernetta Lonni GRADE, MD;  Location: [facility] ORS;  Service: Orthopedics;  Laterality: Left;   INTRAMEDULLARY (IM) NAIL INTERTROCHANTERIC Left 11/29/2016   Procedure: INTRAMEDULLARY (IM) NAIL INTERTROCHANTRIC;  Surgeon: Vernetta Lonni GRADE, MD;  Location: [facility] OR;  Service: Orthopedics;  Laterality: Left;   LAPAROSCOPIC LOOP COLOSTOMY N/A 07/17/2023   Procedure: LAPAROSCOPIC DIVERTING LOOP COLOSTOMY;  Surgeon: Belinda Cough, MD;  Location: [facility] OR;  Service: General;  Laterality: N/A;   OVARY SURGERY     RIGHT HEART CATH N/A 08/12/2021   Procedure: RIGHT HEART CATH;  Surgeon: Court Dorn PARAS, MD;  Location: [facility] INVASIVE CV LAB;  Service: Cardiovascular;  Laterality: N/A;   SHOULDER HEMI-ARTHROPLASTY Left 11/15/2023   Procedure: HEMIARTHROPLASTY, SHOULDER;  Surgeon: Cristy Bonner DASEN, MD;  Location: [facility] OR;  Service: Orthopedics;  Laterality: Left;  with Block  SHOULDER SURGERY Left    TONSILLECTOMY  age 83   VAGINAL HYSTERECTOMY     VIDEO BRONCHOSCOPY Bilateral 05/31/2013   Procedure: VIDEO BRONCHOSCOPY WITHOUT FLUORO;  Surgeon: Dorethia Cave, MD;  Location: [facility] ENDOSCOPY;  Service: Cardiopulmonary;  Laterality: Bilateral;   video bronscoscopy  05/19/1998   lung     Social History:   reports that she has never smoked. She has never used smokeless tobacco. She reports that she does not drink alcohol  and does not use drugs.   Family History:  Her family history includes Breast cancer in her sister and sister; Diabetes in her brother, mother, and sister; Esophageal cancer in her brother; Fibroids in her daughter; Gout in her brother; Heart attack in her brother and mother; Heart disease in her brother; Heart murmur in her son; Hypertension in her brother, daughter, daughter, father, and son; Kidney cancer in her brother; Kidney failure in her brother; Lung cancer in her father; Prostate cancer in her brother; Rheum arthritis in her maternal uncle; Stomach cancer in her brother; Uterine cancer in her daughter. There is no history of Alzheimer's disease or Dementia.   Allergies Allergies  Allergen Reactions   Codeine Swelling and Other (See Comments)    Facial and leg swelling Chest pain    Remicade [Infliximab]  Anaphylaxis    sent me into shock    Zestril [Lisinopril] Swelling and Rash    Face and neck swelling    Ofev  [Nintedanib] Diarrhea     Home Medications  Prior to Admission medications   Medication Sig Start Date End Date Taking? Authorizing Provider  acetaminophen  (TYLENOL ) 500 MG tablet Take 1,000 mg by mouth every 8 (eight) hours as needed (pain).   Yes [provider]  albuterol  (VENTOLIN  HFA) 108 (90 Base) MCG/ACT inhaler Inhale 2 puffs into the lungs in the morning and at bedtime.   Yes [provider]  apixaban  (ELIQUIS ) 2.5 MG TABS tablet Take 1 tablet (2.5 mg total) by mouth 2 (two) times daily. For postoperative DVT prophylaxis 11/17/23 12/17/23 Yes McBane, Caroline N, PA-C  bisacodyl  (DULCOLAX) 10 MG suppository Place 10 mg rectally daily as needed (bowel management).   Yes [provider]  Cholecalciferol  (VITAMIN D3) 50 MCG (2000 UT) TABS Take 2,000 Units by mouth daily.   Yes [provider]  daptomycin  (CUBICIN ) IVPB Inject 700 mg into the vein daily. Indication:  MRSA Bacteremia/septic arthritis of shoulder First Dose: Yes Last Day of Therapy:  12/27/23 Labs - Once weekly:  CBC/D, BMP, and CPK Labs - Once weekly: ESR and CRP Patient taking differently: Inject 700 mg into the vein daily. Indication:  MRSA Bacteremia/septic arthritis of shoulder First Dose: Yes Last Day of Therapy:  12/27/23 Labs - Once weekly:  CBC/D, BMP, and CPK Labs - Once weekly: ESR and CRP Start date 11/23/23 11/22/23 12/31/23 Yes Darci Pore, MD  diclofenac  Sodium (VOLTAREN ) 1 % GEL Apply 2 g topically 3 (three) times daily. 08/04/23  Yes Angiulli, Toribio PARAS, PA-C  DULoxetine  (CYMBALTA ) 60 MG capsule Take 1 capsule (60 mg total) by mouth daily. 08/05/23  Yes Angiulli, Toribio PARAS, PA-C  feeding supplement (ENSURE PLUS HIGH PROTEIN) LIQD Take 237 mLs by mouth 2 (two) times daily between meals. 11/22/23  Yes Darci Pore, MD  furosemide  (LASIX ) 40 MG tablet  Take 1 tablet (40 mg total) by mouth daily. 08/05/23  Yes Angiulli, Toribio PARAS, PA-C  gabapentin  (NEURONTIN ) 300 MG capsule Take 1 capsule (300 mg total) by mouth 2 (two) times  daily. 08/04/23  Yes Angiulli, Toribio PARAS, PA-C  guaiFENesin  (MUCINEX ) 600 MG 12 hr tablet Take 2 tablets (1,200 mg total) by mouth 2 (two) times daily. 08/04/23  Yes Angiulli, Toribio PARAS, PA-C  heparin  lock flush 100 UNIT/ML SOLN injection Inject 5 mLs into the vein daily. 11/23/23 12/27/23 Yes [provider]  hydrocortisone  (ANUSOL -HC) 2.5 % rectal cream Place 1 Application rectally 2 (two) times daily.   Yes [provider]  hyoscyamine  (LEVSIN  SL) 0.125 MG SL tablet Place 1 tablet (0.125 mg total) under the tongue every 8 (eight) hours as needed. Patient taking differently: Place 0.125 mg under the tongue every 8 (eight) hours as needed for cramping. 11/04/23  Yes Zehr, Jessica D, PA-C  levothyroxine  (SYNTHROID ) 50 MCG tablet Take 1 tablet (50 mcg total) by mouth daily. 11/28/22  Yes Mercer Clotilda SAUNDERS, MD  methocarbamol  (ROBAXIN ) 500 MG tablet Take 1 tablet (500 mg total) by mouth every 6 (six) hours as needed for muscle spasms (pain). 08/04/23  Yes Angiulli, Toribio PARAS, PA-C  metoprolol  tartrate (LOPRESSOR ) 25 MG tablet Take 2 tablets (50 mg total) by mouth 2 (two) times daily. 11/22/23  Yes Darci Pore, MD  morphine  (MS CONTIN ) 15 MG 12 hr tablet Take 1 tablet (15 mg total) by mouth every 12 (twelve) hours. 11/22/23  Yes Darci Pore, MD  Multiple Vitamin (MULTIVITAMIN) tablet Take 1 tablet by mouth daily.   Yes [provider]  nitroGLYCERIN  (NITROSTAT ) 0.4 MG SL tablet Place 1 tablet (0.4 mg total) under the tongue every 5 (five) minutes as needed for chest pain. 08/04/23  Yes Angiulli, Toribio PARAS, PA-C  oxyCODONE  (OXY IR/ROXICODONE ) 5 MG immediate release tablet Take 1 pills every 6 hrs as needed for severe breakthrough pain Patient taking differently: Take 5 mg by mouth every 6 (six) hours as  needed for moderate pain (pain score 4-6) or severe pain (pain score 7-10). 11/17/23  Yes McBane, Aleck SAILOR, PA-C  OXYGEN  Inhale 3 L/min into the lungs at bedtime. As needed during day time   Yes [provider]  pantoprazole  (PROTONIX ) 40 MG tablet Take 1 tablet (40 mg total) by mouth 2 (two) times daily. 06/23/23  Yes Mercer Clotilda SAUNDERS, MD  predniSONE  (DELTASONE ) 10 MG tablet Take 1 tablet (10 mg total) by mouth daily with breakfast. 10/15/23  Yes Mercer Clotilda SAUNDERS, MD  sulfamethoxazole -trimethoprim  (BACTRIM  DS) 800-160 MG tablet Take 2 tablets by mouth 2 (two) times daily. Patient taking differently: Take 2 tablets by mouth 2 (two) times daily. Start 12/09/23 End 12/10/23 12/09/23  Yes Fleeta Kathie Jomarie SAILOR, MD  atorvastatin  (LIPITOR) 10 MG tablet Take 1 tablet (10 mg total) by mouth daily. Patient not taking: Reported on 12/16/2023 08/14/22   Mercer Clotilda SAUNDERS, MD  leptospermum manuka honey (MEDIHONEY) PSTE paste Apply 1 Application topically daily. Interchangeable with TheraHoney Apply thin layer (3 mm) to wound. Patient not taking: Reported on 12/16/2023 08/15/23   Rai, Nydia POUR, MD  lidocaine  (LIDODERM ) 5 % Place 1 patch onto the skin daily. Remove & Discard patch within 12 hours or as directed by MD Patient not taking: Reported on 12/16/2023 11/09/23   Debby Fidela CROME, NP  potassium chloride  SA (KLOR-CON  M) 20 MEQ tablet Take 2 tablets (40 mEq total) by mouth 2 (two) times daily. Patient not taking: Reported on 12/16/2023 11/22/23   Darci Pore, MD     Critical care time: 56 min    Mancel Ply, MD, [facility]  [facility] [phone] or  if no answer before 7:00PM call [phone] For any issues after 7:00PM please call eLink [phone]

## 2023-12-17 NOTE — Progress Notes (Signed)
 Son Tod informed of Pt transfer to [facility].

## 2023-12-17 NOTE — Progress Notes (Signed)
 Peripherally Inserted Central Catheter Placement  The IV Nurse has discussed with the patient and/or persons authorized to consent for the patient, the purpose of this procedure and the potential benefits and risks involved with this procedure.  The benefits include less needle sticks, lab draws from the catheter, and the patient may be discharged home with the catheter. Risks include, but not limited to, infection, bleeding, blood clot (thrombus formation), and puncture of an artery; nerve damage and irregular heartbeat and possibility to perform a PICC exchange if needed/ordered by physician.  Alternatives to this procedure were also discussed.  Bard Power PICC patient education guide, fact sheet on infection prevention and patient information card has been provided to patient /or left at bedside.    PICC Placement Documentation  PICC Triple Lumen 12/17/23 Right Brachial 35 cm 0 cm (Active)  Indication for Insertion or Continuance of Line Chronic illness with exacerbations (CF, Sickle Cell, etc.) 12/17/23 1255  Exposed Catheter (cm) 0 cm 12/17/23 1255  Site Assessment Clean, Dry, Intact 12/17/23 1255  Lumen #1 Status Flushed;Saline locked;Blood return noted 12/17/23 1255  Lumen #2 Status Flushed;Saline locked;Blood return noted 12/17/23 1255  Lumen #3 Status Flushed;Saline locked;Blood return noted 12/17/23 1255  Dressing Type Transparent;Securing device 12/17/23 1255  Dressing Status Antimicrobial disc/dressing in place;Clean, Dry, Intact 12/17/23 1255  Line Care Connections checked and tightened 12/17/23 1255  Line Adjustment (NICU/IV Team Only) No 12/17/23 1255  Dressing Intervention New dressing;Adhesive placed at insertion site (IV team only) 12/17/23 1255  Dressing Change Due 12/24/23 12/17/23 1255    PICC Exchanged   Renaee Notice Albarece 12/17/2023, 1:14 PM

## 2023-12-17 NOTE — Progress Notes (Signed)
   Progress Note   Date: 12/17/2023  Patient Name: [name]        MRN#: [id number]   Clarification of the diagnosis of pressure ulcer(s):   Pressure injury coccyx stage 2, present on admission (at the time of the admission order)

## 2023-12-17 NOTE — Significant Event (Addendum)
 Patient was noticed to get acutely short of breath on exam patient is tachypneic and also having coarse crepitations and mild wheezing.  Blood pressure was 132/80 pulse 120/min temperature 99 F Reviewed patient's labs notes and medications.  HEENT anicteric no pallor. Chest bilateral coarse crepitation no wheezing. Heart S1-S2 heard.  Chest x-ray ordered shows diffuse airspace opacities.    Assessment and plan - Acute respiratory failure with hypoxia presently on BiPAP could be from possible fluid overload versus worsening of the pneumonia.  Patient at baseline has interstitial lung disease.  I discontinued the fluids give 1 dose of IV Lasix  40 mg and Xopenex .  Will check BNP troponins and basic labs.   Addendum - Consulted PCCM.  [name].

## 2023-12-17 NOTE — Progress Notes (Signed)
 Patient has now been placed on BiPap. Breathing easier. Sating at 93%.  Will continue to monitor.  Kristene Sotero BRAVO, RN

## 2023-12-17 NOTE — Progress Notes (Signed)
 NAME:  [name], MRN:  [id number], DOB:  11/14/1940, LOS: 2 ADMISSION DATE:  12/15/2023, CONSULTATION DATE:  12/17/23 REFERRING MD:  Dr Franky, CHIEF COMPLAINT:  acute hypoxic respiratory failure   History of Present Illness:  A 83 y.o. female with ILD (?chronic aspiration), chronic hypoxic resp failure on HOT @ 3 L [place] for 20 yrs, restrictive lung disease (mild), septic left shoulder, chronic diastolic heart failure (June 2025, Echo EF 60%, G1DD), ?PE (past hx), HTN, dyslipidemia, hypothyroidism, rheumatoid arthritis, debility, chronic sinus tachycardia, chronic chest pain, and DM-2, who was admitted on 12/15/2023 with progressive SOB and dry cough for 3 days before admission. She was dx with sepsis and community-acquired pneumonia, after presenting from SNF rehab.  She underwent hemiarthroplasty of the left shoulder on 11/15/2023, complicated with septic arthritis, positive for MRSA. Infectious disease was consulted, and the patient was started on daptomycin  via PICC line 11/15/2023, with course of daptomycin  to conclude with final dose to occur on 12/27/2023.   She has no f/c/r, generalized myalgias, or LL edema.   Never smoked. No alcohol  or illicit drug use.  Hospitalist called PCCM team because of wheezing, hypoxia, and being placed on BiPAP 14/8/60%, back up rate 12. IVF was d/c and given IV Lasix  40 mgx1.    Pertinent  Medical History  ILD (?chronic aspiration), chronic hypoxic resp failure on HOT @ 3 L [place] for 20 yrs, restrictive lung disease (mild), septic left shoulder, chronic diastolic heart failure (June 2025, Echo EF 60%, G1DD), ?PE (past hx), HTN, dyslipidemia, hypothyroidism, rheumatoid arthritis, debility, chronic sinus tachycardia, chronic chest pain, DM-2   Significant Hospital Events: Including procedures, antibiotic start and stop dates in addition to other pertinent events   7/29-admitted for shortness of breath 7/31-consulted for worsening respiratory status  Interim  History / Subjective:  Elderly lady, on BiPAP Admits to shortness of breath, no chest pain or chest discomfort  Objective    Blood pressure (!) 162/105, pulse (!) 142, temperature 98.2 F (36.8 C), temperature source Oral, resp. rate 20, height 5' 4 (1.626 m), weight 82.4 kg, SpO2 98%.    Vent Mode: BIPAP;PCV FiO2 (%):  [32 %-60 %] 60 % Set Rate:  [12 bmp] 12 bmp PEEP:  [6 cmH20-8 cmH20] 6 cmH20   Intake/Output Summary (Last 24 hours) at 12/17/2023 0806 Last data filed at 12/17/2023 9358 Gross per 24 hour  Intake 1153.14 ml  Output 1050 ml  Net 103.14 ml   Filed Weights   12/15/23 1140 12/15/23 2333 12/17/23 0750  Weight: 83.9 kg 80.8 kg 82.4 kg    Examination: General: Elderly, does not appear to be an extremis but does appear short of breath HENT: Pupils equal, BiPAP mask in place Lungs: Rales bilaterally Cardiovascular: S1-S2 appreciated Abdomen: Soft, bowel sounds appreciated Extremities: No clubbing, no edema Neuro: Nonfocal GU:  I reviewed last 24 h vitals and pain scores, last 48 h intake and output, last 24 h labs and trends, and last 24 h imaging results. 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 - Daptomycin  has been held - Continue BiPAP for work of breathing - Follow cultures, follow viral PCR - Continue bronchodilators - Risk with bronchoscopy is very high-may not be able to come off the ventilator - Empiric coverage for pneumonia Zosyn , Zithromax   Septic left shoulder MRSA - Was on daptomycin  to be completed 8/10 - Now on vancomycin   History of diastolic dysfunction -  Did receive Lasix  with good response with diuresis  Rheumatoid arthritis and interstitial lung disease Was chronically on prednisone  10 mg daily at home - Continue steroids  For thyroidism - Continue levothyroxine   Type 2 diabetes - Continue monitoring  Risk with attempting  bronchoscopy is that patient may not be able to come off the vent with a history of chronic respiratory failure, interstitial lung disease at baseline which may now be exacerbated by an eosinophilic pneumonitis -Potential trigger has been held-daptomycin   Best Practice (right click and Reselect all SmartList Selections daily)   Diet/type: NPO w/ oral meds DVT prophylaxis other Pressure ulcer(s): present on admission  GI prophylaxis: PPI Lines: yes and it is still needed Foley:  N/A Code Status:  full code Last date of multidisciplinary goals of care discussion [pending shortness of breath]  Labs   CBC: Recent Labs  Lab 12/15/23 1240 12/16/23 0002 12/16/23 1655 12/17/23 0103  WBC 16.3* 17.2* 16.7* 17.4*  NEUTROABS 13.5* 13.6*  --   --   HGB 9.8* 9.5* 9.1* 10.0*  HCT 32.3* 30.3* 29.3* 32.1*  MCV 94.7 91.0 92.7 92.5  PLT 226 243 241 275    Basic Metabolic Panel: Recent Labs  Lab 12/15/23 1240 12/15/23 1430 12/16/23 0002 12/17/23 0103  NA 137  --  138 140  K 4.4  --  3.6 3.6  CL 99  --  102 104  CO2 25  --  26 25  GLUCOSE 125*  --  92 99  BUN 25*  --  25* 18  CREATININE 1.12*  --  1.00 0.86  CALCIUM  7.9*  --  8.2* 8.1*  MG  --  1.9 1.9  --   PHOS  --   --  3.1  --    GFR: Estimated Creatinine Clearance: 52.4 mL/min (by C-G formula based on SCr of 0.86 mg/dL). Recent Labs  Lab 12/15/23 1240 12/16/23 0002 12/16/23 1655 12/17/23 0103  PROCALCITON  --  0.42  --   --   WBC 16.3* 17.2* 16.7* 17.4*    Liver Function Tests: Recent Labs  Lab 12/15/23 1240 12/16/23 0002 12/17/23 0103  AST 25 21 34  ALT 27 26 25   ALKPHOS 181* 152* 177*  BILITOT 1.0 0.7 0.9  PROT 5.9* 5.1* 5.8*  ALBUMIN  2.0* 2.0* 1.9*   No results for input(s): LIPASE, AMYLASE in the last 168 hours. No results for input(s): AMMONIA in the last 168 hours.  ABG    Component Value Date/Time   PHART 7.460 (H) 03/30/2021 1638   PCO2ART 46.6 03/30/2021 1638   PO2ART 70 (L) 03/30/2021  1638   HCO3 27.8 12/17/2023 0118   TCO2 24 07/15/2023 1215   O2SAT 52.5 12/17/2023 0118     Coagulation Profile: No results for input(s): INR, PROTIME in the last 168 hours.  Cardiac Enzymes: Recent Labs  Lab 12/16/23 0002  CKTOTAL 184    HbA1C: Hemoglobin A1C  Date/Time Value Ref Range Status  10/15/2023 02:48 PM 6.0 (A) 4.0 - 5.6 % Final   Hgb A1c MFr Bld  Date/Time Value Ref Range Status  06/14/2023 03:16 AM 5.6 4.8 - 5.6 % Final    Comment:    (NOTE) Pre diabetes:          5.7%-6.4%  Diabetes:              >6.4%  Glycemic control for   <7.0% adults with diabetes   12/16/2022 09:43 AM 5.5 4.8 - 5.6 % Final    Comment:    (  NOTE)         Prediabetes: 5.7 - 6.4         Diabetes: >6.4         Glycemic control for adults with diabetes: <7.0     CBG: Recent Labs  Lab 12/16/23 1759 12/16/23 1936 12/16/23 2132 12/17/23 0100 12/17/23 0603  GLUCAP 63* 144* 102* 87 77    Review of Systems:   Shortness of breath  Past Medical History:  She,  has a past medical history of Acute encephalopathy (03/30/2021), Acute kidney injury superimposed on chronic kidney disease (HCC) (04/17/2015), Acute on chronic diastolic (congestive) heart failure (HCC), Anemia, Anxiety, Arthritis, Asthma, Carotid artery occlusion, Chest pain of uncertain etiology (01/28/2012), Chronic fatigue, Chronic kidney disease, Closed intertrochanteric fracture of hip, left, initial encounter (HCC) (11/29/2016), Closed left hip fracture (HCC), Closed nondisplaced intertrochanteric fracture of left femur with delayed healing (01/14/2017), Clotting disorder (HCC), Contusion of left knee, COPD (chronic obstructive pulmonary disease) (HCC), Coronary artery disease, Depression, reactive, Diabetes mellitus, Diastolic dysfunction, Difficulty in walking, Family history of heart disease, Flu (06/26/2023), Generalized muscle weakness, Gout, Hardware complicating wound infection (HCC) (12/08/2023), High  cholesterol, History of falling, Hypertension, Hypothyroidism, Interstitial lung disease (HCC), 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   Surgical History:   Past Surgical History:  Procedure Laterality Date   BRAIN SURGERY     Gamma knife 10/13. Needs repeat spring  '14   CARDIAC CATHETERIZATION N/A 10/31/2014   Procedure: Right/Left Heart Cath and Coronary Angiography;  Surgeon: Candyce GORMAN Reek, MD;  Location: [facility] INVASIVE CV LAB;  Service: Cardiovascular;  Laterality: N/A;   COLONOSCOPY WITH PROPOFOL  N/A 04/14/2022   Procedure: COLONOSCOPY WITH PROPOFOL ;  Surgeon: Albertus Gordy HERO, MD;  Location: [facility] ENDOSCOPY;  Service: Gastroenterology;  Laterality: N/A;   CYST EXCISION     2 on face   CYST REMOVAL NECK     ESOPHAGOGASTRODUODENOSCOPY (EGD) WITH PROPOFOL  N/A 09/14/2014   Procedure: ESOPHAGOGASTRODUODENOSCOPY (EGD) WITH PROPOFOL ;  Surgeon: Lamar JONETTA Aho, MD;  Location: [facility] ENDOSCOPY;  Service: Endoscopy;  Laterality: N/A;   INCISION AND DRAINAGE HIP Left 01/16/2017   Procedure: IRRIGATION AND DEBRIDEMENT LEFT HIP;  Surgeon: Vernetta Lonni GRADE, MD;  Location: [facility] ORS;  Service: Orthopedics;  Laterality: Left;   INTRAMEDULLARY (IM) NAIL INTERTROCHANTERIC Left 11/29/2016   Procedure: INTRAMEDULLARY (IM) NAIL INTERTROCHANTRIC;  Surgeon: Vernetta Lonni GRADE, MD;  Location: [facility] OR;  Service: Orthopedics;  Laterality: Left;   LAPAROSCOPIC LOOP COLOSTOMY N/A 07/17/2023   Procedure: LAPAROSCOPIC DIVERTING LOOP COLOSTOMY;  Surgeon: Belinda Cough, MD;  Location: [facility] OR;  Service: General;  Laterality: N/A;   OVARY SURGERY     RIGHT HEART  CATH N/A 08/12/2021   Procedure: RIGHT HEART CATH;  Surgeon: Court Dorn PARAS, MD;  Location: [facility] INVASIVE CV LAB;  Service: Cardiovascular;  Laterality: N/A;   SHOULDER HEMI-ARTHROPLASTY Left 11/15/2023   Procedure: HEMIARTHROPLASTY, SHOULDER;  Surgeon: Cristy Bonner DASEN, MD;  Location: [facility] OR;  Service: Orthopedics;  Laterality: Left;  with Block   SHOULDER SURGERY Left    TONSILLECTOMY  age 89   VAGINAL HYSTERECTOMY     VIDEO BRONCHOSCOPY Bilateral 05/31/2013   Procedure: VIDEO BRONCHOSCOPY WITHOUT FLUORO;  Surgeon: Dorethia Cave, MD;  Location: [facility] ENDOSCOPY;  Service: Cardiopulmonary;  Laterality: Bilateral;   video bronscoscopy  05/19/1998   lung     Social History:   reports that she has never smoked. She has never used smokeless tobacco. She reports that she does not drink alcohol  and does not use drugs.   Family History:  Her family history includes Breast cancer in her sister and sister; Diabetes in her brother, mother, and sister; Esophageal cancer in her brother; Fibroids in her daughter; Gout in her brother; Heart attack in her brother and mother; Heart disease in her brother; Heart murmur in her son; Hypertension in her brother, daughter, daughter, father, and son; Kidney cancer in her brother; Kidney failure in her brother; Lung cancer in her father; Prostate cancer in her brother; Rheum arthritis in her maternal uncle; Stomach cancer in her brother; Uterine cancer in her daughter. There is no history of Alzheimer's disease or Dementia.   Allergies Allergies  Allergen Reactions   Codeine Swelling and Other (See Comments)    Facial and leg swelling Chest pain    Remicade [Infliximab] Anaphylaxis    sent me into shock    Zestril [Lisinopril] Swelling and Rash    Face and neck swelling    Ofev  [Nintedanib] Diarrhea    The patient is critically ill with multiple organ systems failure and requires high complexity decision making for assessment and support, frequent  evaluation and titration of therapies, application of advanced monitoring technologies and extensive interpretation of multiple databases. Critical Care Time devoted to patient care services described in this note independent of APP/resident time (if applicable)  is 30 minutes.   Jennet Epley MD [facility] Personal pager: See Amion If unanswered, please page CCM On-call: #[phone]

## 2023-12-17 NOTE — Progress Notes (Addendum)
 Pharmacy Antibiotic Note  [name] is a 83 y.o. female admitted on 12/15/2023 with SOB and PNA.  Patient has a history of septic arthritis on Cubicin  with planned end date of 12/27/23.  She requires BiPAP overnight and transferred to the ICU on 7/31.  Pharmacy has been consulted for vancomycin  dosing.  She is also on Zosyn  and azithromycin .  Renal function improving, Tmax 101.1, WBC 17.4.  Plan: Reduce vanc to 750mg  IV Q24H for AUC 420 using SCr 0.86 Zosyn  EID 3.375gm IV Q8H Azith 500mg  PO daily Monitor renal fxn, micro data, vanc levels as indicated  Height: 5' 4 (162.6 cm) Weight: 82.4 kg (181 lb 10.5 oz) IBW/kg (Calculated) : 54.7  Temp (24hrs), Avg:98.8 F (37.1 C), Min:98.1 F (36.7 C), Max:101.1 F (38.4 C)  Recent Labs  Lab 12/15/23 1240 12/16/23 0002 12/16/23 1655 12/17/23 0103  WBC 16.3* 17.2* 16.7* 17.4*  CREATININE 1.12* 1.00  --  0.86    Estimated Creatinine Clearance: 52.4 mL/min (by C-G formula based on SCr of 0.86 mg/dL).    Allergies  Allergen Reactions   Codeine Swelling and Other (See Comments)    Facial and leg swelling Chest pain    Remicade [Infliximab] Anaphylaxis    sent me into shock    Zestril [Lisinopril] Swelling and Rash    Face and neck swelling    Ofev  [Nintedanib] Diarrhea    Azith 7/29 >> 7/31 CRO 7/29 >> 7/31 Dapto PTA >> 7/31 Vanc 7/31 >> Zosyn  7/31 >>   7/30 CK = 184   7/29 Group A strep PCR - negative 7/30 BCx - NGTD 7/31 TA -   [name], PharmD, BCPS, BCCCP 12/17/2023, 8:20 AM

## 2023-12-17 NOTE — Progress Notes (Signed)
 The patient was seeming more confused than before and was moaning and moving around a lot. Noticed her respirations were at least 32 bpm and she was wheezing. Saw that her O2 sats were in the 70s on 3 L nasal cannula. Placed her on a non rebreather mask on 15 L. Here O2 sats are now 97%. BP 171/108. HR 125. Resp 32. Temp 99.8 axillary. While on the mask the patient was becoming calm and clarity started to improve along with her O2 sats. Informed the on call physician of what occurred. He said he would come assess the patient and ordered to stop her IV fluids.  The doctor also ordered a STAT chest x-ray.  Will continue to monitor.  Kristene Sotero BRAVO, RN

## 2023-12-17 NOTE — Progress Notes (Signed)
 RT transported pt on bipap from 4E02 to 3M03 without any complications. RN and Rapid at bedside.

## 2023-12-17 NOTE — Progress Notes (Signed)
 SLP Cancellation Note  Patient Details Name: [name] MRN: [id number] DOB: 06/28/1940   Cancelled treatment:       Reason Eval/Treat Not Completed: Patient's level of consciousness Patient awake but drowsy/lethargic and on supplemental oxygen  via HHFNC at 40L. SLP elevated HOB but patient declined even a small sip of water and had difficulty keeping her eyes open. SLP will follow for readiness. Per RN, she tolerated pills in applesauce.  Norleen IVAR Blase, MA, CCC-SLP Speech Therapy

## 2023-12-18 DIAGNOSIS — J9601 Acute respiratory failure with hypoxia: Secondary | ICD-10-CM

## 2023-12-18 DIAGNOSIS — J849 Interstitial pulmonary disease, unspecified: Secondary | ICD-10-CM

## 2023-12-18 DIAGNOSIS — A419 Sepsis, unspecified organism: Secondary | ICD-10-CM | POA: Diagnosis not present

## 2023-12-18 LAB — MRSA NEXT GEN BY PCR, NASAL: MRSA by PCR Next Gen: DETECTED — AB

## 2023-12-18 LAB — BASIC METABOLIC PANEL WITH GFR
Anion gap: 13 (ref 5–15)
BUN: 23 mg/dL (ref 8–23)
CO2: 23 mmol/L (ref 22–32)
Calcium: 8.3 mg/dL — ABNORMAL LOW (ref 8.9–10.3)
Chloride: 103 mmol/L (ref 98–111)
Creatinine, Ser: 1.18 mg/dL — ABNORMAL HIGH (ref 0.44–1.00)
GFR, Estimated: 46 mL/min — ABNORMAL LOW (ref >60)
Glucose, Bld: 138 mg/dL — ABNORMAL HIGH (ref 70–99)
Potassium: 3.3 mmol/L — ABNORMAL LOW (ref 3.5–5.1)
Sodium: 139 mmol/L (ref 135–145)

## 2023-12-18 LAB — GLUCOSE, CAPILLARY
Glucose-Capillary: 102 mg/dL — ABNORMAL HIGH (ref 70–99)
Glucose-Capillary: 106 mg/dL — ABNORMAL HIGH (ref 70–99)
Glucose-Capillary: 109 mg/dL — ABNORMAL HIGH (ref 70–99)
Glucose-Capillary: 120 mg/dL — ABNORMAL HIGH (ref 70–99)
Glucose-Capillary: 131 mg/dL — ABNORMAL HIGH (ref 70–99)
Glucose-Capillary: 142 mg/dL — ABNORMAL HIGH (ref 70–99)

## 2023-12-18 LAB — HEMOGLOBIN A1C
Hgb A1c MFr Bld: 5.5 % (ref 4.8–5.6)
Mean Plasma Glucose: 111 mg/dL

## 2023-12-18 MED ORDER — POTASSIUM CHLORIDE CRYS ER 20 MEQ PO TBCR
40.0000 meq | EXTENDED_RELEASE_TABLET | ORAL | Status: AC
  Administered 2023-12-18 (×2): 40 meq via ORAL
  Filled 2023-12-18 (×2): qty 2

## 2023-12-18 MED ORDER — VANCOMYCIN HCL 1250 MG/250ML IV SOLN
1250.0000 mg | INTRAVENOUS | Status: DC
  Administered 2023-12-19: 1250 mg via INTRAVENOUS
  Filled 2023-12-18: qty 250

## 2023-12-18 MED ORDER — GABAPENTIN 300 MG PO CAPS
300.0000 mg | ORAL_CAPSULE | Freq: Two times a day (BID) | ORAL | Status: DC
  Administered 2023-12-18 – 2023-12-23 (×11): 300 mg via ORAL
  Filled 2023-12-18 (×11): qty 1

## 2023-12-18 MED ORDER — DULOXETINE HCL 60 MG PO CPEP
60.0000 mg | ORAL_CAPSULE | Freq: Every day | ORAL | Status: DC
  Administered 2023-12-18 – 2023-12-23 (×6): 60 mg via ORAL
  Filled 2023-12-18 (×6): qty 1

## 2023-12-18 MED ORDER — WARFARIN - PHARMACIST DOSING INPATIENT: Freq: Every day | Status: DC

## 2023-12-18 MED ORDER — WARFARIN SODIUM 5 MG PO TABS: 5.0000 mg | ORAL_TABLET | Freq: Once | ORAL | Status: DC

## 2023-12-18 NOTE — Progress Notes (Signed)
 [facility] ADULT ICU REPLACEMENT PROTOCOL   The patient does apply for the [facility] Adult ICU Electrolyte Replacment Protocol based on the criteria listed below:   1.Exclusion criteria: TCTS, ECMO, Dialysis, and Myasthenia Gravis patients 2. Is GFR >/= 30 ml/min? Yes.    Patient's GFR today is >60 3. Is SCr </= 2? Yes.   Patient's SCr is 1.18 mg/dL 4. Did SCr increase >/= 0.5 in 24 hours? No. 5.Pt's weight >40kg  Yes.   6. Abnormal electrolyte(s):   K 3.3  7. Electrolytes replaced per protocol 8.  Call MD STAT for K+ </= 2.5, Phos </= 1, or Mag </= 1 Physician:  A. Haze Harlene SAUNDERS Haileyann Staiger 12/18/2023 5:57 AM

## 2023-12-18 NOTE — Evaluation (Signed)
 Clinical/Bedside Swallow Evaluation Patient Details  Name: PEARLE WANDLER MRN: [id number] Date of Birth: 1941-01-25  Today's Date: 12/18/2023 Time: SLP Start Time (ACUTE ONLY): 1119 SLP Stop Time (ACUTE ONLY): 1136 SLP Time Calculation (min) (ACUTE ONLY): 17 min  Past Medical History:  Past Medical History:  Diagnosis Date   Acute encephalopathy 03/30/2021   11/12 - 04/03/2021 acute encephalopathy in the context of altered mental status and poor oral intake following increasing pain medicines and associated Klebsiella UTI.     Acute kidney injury superimposed on chronic kidney disease (HCC) 04/17/2015   11/12 - 04/03/2021 AKI with creatinine of 2.22 and GFR 22 indicating stage IV renal disease in the context of encephalopathy secondary to increased pain medications with decreased oral intake of fluids and nutrition.     Acute on chronic diastolic (congestive) heart failure (HCC)    Anemia    iron  deficiency anemia - secondary to blood loss ( chronic)    Anxiety    Arthritis    endstage changes bilateral knees/bilateral ankles.    Asthma    Carotid artery occlusion    Chest pain of uncertain etiology 01/28/2012   Normal coronaries at cath     Chronic fatigue    Chronic kidney disease    Closed intertrochanteric fracture of hip, left, initial encounter (HCC) 11/29/2016   Closed left hip fracture (HCC)    Closed nondisplaced intertrochanteric fracture of left femur with delayed healing 01/14/2017   Clotting disorder (HCC)    pt denies this   Contusion of left knee    due to fall 1/14.   COPD (chronic obstructive pulmonary disease) (HCC)    pulmonary fibrosis   Coronary artery disease    Depression, reactive    Diabetes mellitus    type II    Diastolic dysfunction    Difficulty in walking    Family history of heart disease    Flu 06/26/2023   Generalized muscle weakness    Gout    Hardware complicating wound infection (HCC) 12/08/2023   High cholesterol    History of  falling    Hypertension    Hypothyroidism    Interstitial lung disease (HCC)    Meningioma of left sphenoid wing involving cavernous sinus (HCC) 02/17/2012   Continue diplopia, left eye pain and left headaches.     Morbid obesity (HCC)    Neuromuscular disorder (HCC)    diabetic neuropathy    Normal coronary arteries    cardiac catheterization performed  10/31/14   Pneumonia    RA (rheumatoid arthritis) (HCC)    has been off methotreaxte since 10/13.   Septic shock (HCC) 06/13/2023   Spinal stenosis of lumbar region    Thyroid  disease    Unspecified lack of coordination    URI (upper respiratory infection)    UTI (urinary tract infection) 03/31/2021   11/12 - 04/03/2021 Klebsiella UTI associated with encephalopathy in the context of AKI.  Status post IV ceftriaxone  followed by oral Keflex  x48 hours.     Past Surgical History:  Past Surgical History:  Procedure Laterality Date   BRAIN SURGERY     Gamma knife 10/13. Needs repeat spring  '14   CARDIAC CATHETERIZATION N/A 10/31/2014   Procedure: Right/Left Heart Cath and Coronary Angiography;  Surgeon: Candyce GORMAN Reek, MD;  Location: [facility] INVASIVE CV LAB;  Service: Cardiovascular;  Laterality: N/A;   COLONOSCOPY WITH PROPOFOL  N/A 04/14/2022   Procedure: COLONOSCOPY WITH PROPOFOL ;  Surgeon: Albertus Gordy HERO, MD;  Location: [facility];  Service: Gastroenterology;  Laterality: N/A;   CYST EXCISION     2 on face   CYST REMOVAL NECK     ESOPHAGOGASTRODUODENOSCOPY (EGD) WITH PROPOFOL  N/A 09/14/2014   Procedure: ESOPHAGOGASTRODUODENOSCOPY (EGD) WITH PROPOFOL ;  Surgeon: Lamar JONETTA Aho, MD;  Location: [facility];  Service: Endoscopy;  Laterality: N/A;   INCISION AND DRAINAGE HIP Left 01/16/2017   Procedure: IRRIGATION AND DEBRIDEMENT LEFT HIP;  Surgeon: Vernetta Lonni GRADE, MD;  Location: [facility] ORS;  Service: Orthopedics;  Laterality: Left;   INTRAMEDULLARY (IM) NAIL INTERTROCHANTERIC Left 11/29/2016   Procedure: INTRAMEDULLARY (IM)  NAIL INTERTROCHANTRIC;  Surgeon: Vernetta Lonni GRADE, MD;  Location: [facility] OR;  Service: Orthopedics;  Laterality: Left;   LAPAROSCOPIC LOOP COLOSTOMY N/A 07/17/2023   Procedure: LAPAROSCOPIC DIVERTING LOOP COLOSTOMY;  Surgeon: Belinda Cough, MD;  Location: [facility] OR;  Service: General;  Laterality: N/A;   OVARY SURGERY     RIGHT HEART CATH N/A 08/12/2021   Procedure: RIGHT HEART CATH;  Surgeon: Court Dorn PARAS, MD;  Location: [facility] INVASIVE CV LAB;  Service: Cardiovascular;  Laterality: N/A;   SHOULDER HEMI-ARTHROPLASTY Left 11/15/2023   Procedure: HEMIARTHROPLASTY, SHOULDER;  Surgeon: Cristy Bonner DASEN, MD;  Location: [facility] OR;  Service: Orthopedics;  Laterality: Left;  with Block   SHOULDER SURGERY Left    TONSILLECTOMY  age 83   VAGINAL HYSTERECTOMY     VIDEO BRONCHOSCOPY Bilateral 05/31/2013   Procedure: VIDEO BRONCHOSCOPY WITHOUT FLUORO;  Surgeon: Dorethia Cave, MD;  Location: [facility] ENDOSCOPY;  Service: Cardiopulmonary;  Laterality: Bilateral;   video bronscoscopy  05/19/1998   lung   HPI:  83 y.o. female with ILD (?chronic aspiration), chronic hypoxic resp failure on HOT @ 3 L [place] for 20 yrs, restrictive lung disease (mild), septic left shoulder, chronic diastolic heart failure, COPD, ?PE (past hx), HTN, dyslipidemia, hypothyroidism, rheumatoid arthritis, debility, chronic sinus tachycardia, chronic chest pain, and DM-2, who was admitted on 12/15/2023 with progressive SOB and dry cough for 3 days before admission. She was dx with sepsis and community-acquired pneumonia, after presenting from SNF rehab. [name], NP states she suspects pna may be from medication she was taking versus aspiration. BSE 06/20/2023 without overt s/s aspiration, recommended Dys 3/thin.    Assessment / Plan / Recommendation  Clinical Impression  Pt denies prior or currrent difficulty swallowing or esophageal impairments/GERD. She is alert and cognitively appropriate with functional oromotor abilities. Lower dentition is intact  with upper denture plate. RN stated pt took pills with thin without difficulty. Pt consumed multiple sips thin via straw without s/s aspiration. Consumed most of graham cracker with timely mastication and no residue. She had slightly increased work of breathing x 1 and stated she feels tired and weak. Factors that increase her risk for silent aspiration include COPD, ILD, recurrent pneumonias and she is currently on 40 L HFNC. Recommend FEES and pt is agreeable and able to provide consent. SLP recommended a regular diet, thin liquids, pills with thin and can continue until FEES. Pt was given menu to order foods she desires. SLP Visit Diagnosis: Dysphagia, unspecified (R13.10)    Aspiration Risk  Mild aspiration risk    Diet Recommendation Regular;Thin liquid    Liquid Administration via: Cup;Straw Medication Administration: Whole meds with liquid Supervision: Patient able to self feed Compensations: Slow rate;Small sips/bites;Other (Comment) (rest breaks if needed) Postural Changes: Seated upright at 90 degrees    Other  Recommendations Oral Care Recommendations: Oral care BID     Assistance Recommended at Discharge  Functional Status Assessment    Frequency and Duration            Prognosis        Swallow Study   General HPI: 83 y.o. female with ILD (?chronic aspiration), chronic hypoxic resp failure on HOT @ 3 L [place] for 20 yrs, restrictive lung disease (mild), septic left shoulder, chronic diastolic heart failure, COPD, ?PE (past hx), HTN, dyslipidemia, hypothyroidism, rheumatoid arthritis, debility, chronic sinus tachycardia, chronic chest pain, and DM-2, who was admitted on 12/15/2023 with progressive SOB and dry cough for 3 days before admission. She was dx with sepsis and community-acquired pneumonia, after presenting from SNF rehab. [name], NP states she suspects pna may be from medication she was taking versus aspiration. BSE 06/20/2023 without overt s/s aspiration, recommended  Dys 3/thin. Type of Study: Bedside Swallow Evaluation Previous Swallow Assessment:  (see HPI) Diet Prior to this Study: Dysphagia 1 (pureed);Thin liquids (Level 0) Temperature Spikes Noted: No Respiratory Status: Other (comment) (HFNC 40 L) History of Recent Intubation: No Behavior/Cognition: Alert;Pleasant mood;Cooperative Oral Cavity Assessment: Within Functional Limits Oral Care Completed by SLP: No Oral Cavity - Dentition: Dentures, top;Other (Comment) (natural lower) Vision: Functional for self-feeding Self-Feeding Abilities: Able to feed self Patient Positioning: Upright in bed Baseline Vocal Quality: Normal Volitional Cough: Strong Volitional Swallow: Able to elicit    Oral/Motor/Sensory Function Overall Oral Motor/Sensory Function: Within functional limits   Ice Chips Ice chips: Not tested   Thin Liquid Thin Liquid: Within functional limits Presentation: Straw;Cup    Nectar Thick Nectar Thick Liquid: Not tested   Honey Thick Honey Thick Liquid: Not tested   Puree Puree: Within functional limits   Solid     Solid: Within functional limits      [name] 12/18/2023,12:22 PM

## 2023-12-18 NOTE — Plan of Care (Signed)
  Problem: Education: Goal: Knowledge of General Education information will improve Description: Including pain rating scale, medication(s)/side effects and non-pharmacologic comfort measures Outcome: Progressing   Problem: Health Behavior/Discharge Planning: Goal: Ability to manage health-related needs will improve Outcome: Progressing   Problem: Clinical Measurements: Goal: Ability to maintain clinical measurements within normal limits will improve Outcome: Progressing Goal: Will remain free from infection Outcome: Progressing Goal: Diagnostic test results will improve Outcome: Progressing Goal: Respiratory complications will improve Outcome: Progressing Goal: Cardiovascular complication will be avoided Outcome: Progressing   Problem: Nutrition: Goal: Adequate nutrition will be maintained Outcome: Progressing   Problem: Coping: Goal: Level of anxiety will decrease Outcome: Progressing   Problem: Elimination: Goal: Will not experience complications related to bowel motility Outcome: Progressing   Problem: Pain Managment: Goal: General experience of comfort will improve and/or be controlled Outcome: Progressing   Problem: Safety: Goal: Ability to remain free from injury will improve Outcome: Progressing   Problem: Skin Integrity: Goal: Risk for impaired skin integrity will decrease Outcome: Progressing   Problem: Clinical Measurements: Goal: Ability to maintain a body temperature in the normal range will improve Outcome: Progressing   Problem: Respiratory: Goal: Ability to maintain adequate ventilation will improve Outcome: Progressing Goal: Ability to maintain a clear airway will improve Outcome: Progressing   Problem: Education: Goal: Ability to describe self-care measures that may prevent or decrease complications (Diabetes Survival Skills Education) will improve Outcome: Progressing   Problem: Coping: Goal: Ability to adjust to condition or change in  health will improve Outcome: Progressing   Problem: Health Behavior/Discharge Planning: Goal: Ability to identify and utilize available resources and services will improve Outcome: Progressing Goal: Ability to manage health-related needs will improve Outcome: Progressing   Problem: Metabolic: Goal: Ability to maintain appropriate glucose levels will improve Outcome: Progressing   Problem: Nutritional: Goal: Maintenance of adequate nutrition will improve Outcome: Progressing

## 2023-12-18 NOTE — Progress Notes (Signed)
 NAME:  [name], MRN:  [id number], DOB:  November 30, 1940, LOS: 3 ADMISSION DATE:  12/15/2023, CONSULTATION DATE:  12/17/23 REFERRING MD:  Dr. Franky, CHIEF COMPLAINT:  Acute Hypoxic Respiratory Failure   History of Present Illness:  A 83 y.o. female with ILD (?chronic aspiration), chronic hypoxic resp failure on HOT @ 3 L [place] for 20 yrs, restrictive lung disease (mild), septic left shoulder, chronic diastolic heart failure (June 2025, Echo EF 60%, G1DD), ?PE (past hx), HTN, dyslipidemia, hypothyroidism, rheumatoid arthritis, debility, chronic sinus tachycardia, chronic chest pain, and DM-2, who was admitted on 12/15/2023 with progressive SOB and dry cough for 3 days before admission. She was dx with sepsis and community-acquired pneumonia, after presenting from SNF rehab.  She underwent hemiarthroplasty of the left shoulder on 11/15/2023, complicated with septic arthritis, positive for MRSA. Infectious disease was consulted, and the patient was started on daptomycin  via PICC line 11/15/2023, with course of daptomycin  to conclude with final dose to occur on 12/27/2023.   She has no f/c/r, generalized myalgias, or LL edema.   Never smoked. No alcohol  or illicit drug use.  Hospitalist called PCCM team because of wheezing, hypoxia, and being placed on BiPAP 14/8/60%, back up rate 12. IVF was d/c and given IV Lasix  40 mgx1.    Pertinent  Medical History  ILD (?chronic aspiration), chronic hypoxic resp failure on HOT @ 3 L [place] for 20 yrs, restrictive lung disease (mild), septic left shoulder, chronic diastolic heart failure (June 2025, Echo EF 60%, G1DD), ?PE (past hx), HTN, dyslipidemia, hypothyroidism, rheumatoid arthritis, debility, chronic sinus tachycardia, chronic chest pain, DM-2, sigmoid colon stricture s/p colectomy and colostomy 06/2023  Significant Hospital Events: Including procedures, antibiotic start and stop dates in addition to other pertinent events   7/29: admitted for shortness of  breath 7/31: consulted for worsening respiratory status 8/1: remains on HHFNC  Interim History / Subjective:  Patient remains on HHFNC. No concerns overnight.   Objective    Blood pressure (!) 140/83, pulse 78, temperature 98.3 F (36.8 C), temperature source Oral, resp. rate 19, height 5' 4 (1.626 m), weight 81.5 kg, SpO2 95%.    Vent Mode: BIPAP;PCV FiO2 (%):  [60 %] 60 % Set Rate:  [12 bmp] 12 bmp PEEP:  [6 cmH20] 6 cmH20 Pressure Support:  [6 cmH20] 6 cmH20   Intake/Output Summary (Last 24 hours) at 12/18/2023 0720 Last data filed at 12/18/2023 0700 Gross per 24 hour  Intake 818.36 ml  Output 1525 ml  Net -706.64 ml   Filed Weights   12/15/23 2333 12/17/23 0750 12/18/23 0425  Weight: 80.8 kg 82.4 kg 81.5 kg    Examination: General: Awake and Alert in NAD HEENT: NCAT. Sclera anicteric. Cardiovascular: RRR. No M/R/G Respiratory: CTAB, normal WOB on RA. No wheezing, crackles, rhonchi, or diminished breath sounds. Abdomen: Soft, non-tender, non-distended. Bowel sounds normoactive Extremities: Able to move all extremities. No BLE edema. Skin: Warm and dry. Neuro: A&Ox3. No focal neurological deficits.  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 Transitioned to Vanc and Zosyn  on 7/31. -  Continue Vancomycin  and Zosyn    History of diastolic dysfunction - Continue Lasix  40 mg BID  Rheumatoid arthritis and interstitial lung disease Was chronically on prednisone  10 mg daily at home - Continue  steroids at Prednisone  40 mg daily   Hypothyroidism - Continue levothyroxine  50 mcg daily  Hypertension - Continue Clonidine  0.1 mg TID - Continue Metoprolol  tartrate 100 mg BID   T2DM - Continue monitoring   Risk with attempting bronchoscopy is that patient may not be able to come off the vent with a history of chronic respiratory failure, interstitial lung disease at baseline which may now be exacerbated by an eosinophilic pneumonitis - Potential trigger has been held - Adult nurse (right click and Reselect all SmartList Selections daily)   Diet/type: NPO w/ oral meds DVT prophylaxis other Pressure ulcer(s): present on admission  GI prophylaxis: PPI Lines: yes and it is still needed Foley:  N/A Code Status:  full code Last date of multidisciplinary goals of care discussion [pending SOB]  Labs   CBC: Recent Labs  Lab 12/15/23 1240 12/16/23 0002 12/16/23 1655 12/17/23 0103  WBC 16.3* 17.2* 16.7* 17.4*  NEUTROABS 13.5* 13.6*  --   --   HGB 9.8* 9.5* 9.1* 10.0*  HCT 32.3* 30.3* 29.3* 32.1*  MCV 94.7 91.0 92.7 92.5  PLT 226 243 241 275    Basic Metabolic Panel: Recent Labs  Lab 12/15/23 1240 12/15/23 1430 12/16/23 0002 12/17/23 0103 12/18/23 0412  NA 137  --  138 140 139  K 4.4  --  3.6 3.6 3.3*  CL 99  --  102 104 103  CO2 25  --  26 25 23   GLUCOSE 125*  --  92 99 138*  BUN 25*  --  25* 18 23  CREATININE 1.12*  --  1.00 0.86 1.18*  CALCIUM  7.9*  --  8.2* 8.1* 8.3*  MG  --  1.9 1.9  --   --   PHOS  --   --  3.1  --   --    GFR: Estimated Creatinine Clearance: 37.9 mL/min (A) (by C-G formula based on SCr of 1.18 mg/dL (H)). Recent Labs  Lab 12/15/23 1240 12/16/23 0002 12/16/23 1655 12/17/23 0103 12/17/23 0909  PROCALCITON  --  0.42  --   --  0.48  WBC 16.3* 17.2* 16.7* 17.4*  --     Liver Function Tests: Recent Labs  Lab 12/15/23 1240 12/16/23 0002 12/17/23 0103  AST 25 21 34  ALT 27 26 25   ALKPHOS 181* 152* 177*  BILITOT  1.0 0.7 0.9  PROT 5.9* 5.1* 5.8*  ALBUMIN  2.0* 2.0* 1.9*   No results for input(s): LIPASE, AMYLASE in the last 168 hours. No results for input(s): AMMONIA in the last 168 hours.  ABG    Component Value Date/Time   PHART 7.460 (H) 03/30/2021 1638   PCO2ART 46.6 03/30/2021 1638   PO2ART 70 (L) 03/30/2021 1638   HCO3 27.8 12/17/2023 0118   TCO2 24 07/15/2023 1215   O2SAT 52.5 12/17/2023 0118     Coagulation Profile: No results for input(s): INR, PROTIME in the last 168 hours.  Cardiac Enzymes: Recent Labs  Lab 12/16/23 0002  CKTOTAL 184    HbA1C: Hemoglobin A1C  Date/Time Value Ref Range Status  10/15/2023 02:48 PM 6.0 (A) 4.0 - 5.6 % Final   Hgb A1c MFr Bld  Date/Time Value Ref Range Status  06/14/2023 03:16 AM 5.6 4.8 - 5.6 % Final    Comment:    (  NOTE) Pre diabetes:          5.7%-6.4%  Diabetes:              >6.4%  Glycemic control for   <7.0% adults with diabetes   12/16/2022 09:43 AM 5.5 4.8 - 5.6 % Final    Comment:    (NOTE)         Prediabetes: 5.7 - 6.4         Diabetes: >6.4         Glycemic control for adults with diabetes: <7.0     CBG: Recent Labs  Lab 12/17/23 1115 12/17/23 1515 12/17/23 2043 12/17/23 2347 12/18/23 0330  GLUCAP 131* 121* 128* 110* 109*    Review of Systems:   No concerns  Past Medical History:  She,  has a past medical history of Acute encephalopathy (03/30/2021), Acute kidney injury superimposed on chronic kidney disease (HCC) (04/17/2015), Acute on chronic diastolic (congestive) heart failure (HCC), Anemia, Anxiety, Arthritis, Asthma, Carotid artery occlusion, Chest pain of uncertain etiology (01/28/2012), Chronic fatigue, Chronic kidney disease, Closed intertrochanteric fracture of hip, left, initial encounter (HCC) (11/29/2016), Closed left hip fracture (HCC), Closed nondisplaced intertrochanteric fracture of left femur with delayed healing (01/14/2017), Clotting disorder (HCC), Contusion of left knee,  COPD (chronic obstructive pulmonary disease) (HCC), Coronary artery disease, Depression, reactive, Diabetes mellitus, Diastolic dysfunction, Difficulty in walking, Family history of heart disease, Flu (06/26/2023), Generalized muscle weakness, Gout, Hardware complicating wound infection (HCC) (12/08/2023), High cholesterol, History of falling, Hypertension, Hypothyroidism, Interstitial lung disease (HCC), 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   Surgical History:   Past Surgical History:  Procedure Laterality Date   BRAIN SURGERY     Gamma knife 10/13. Needs repeat spring  '14   CARDIAC CATHETERIZATION N/A 10/31/2014   Procedure: Right/Left Heart Cath and Coronary Angiography;  Surgeon: Candyce GORMAN Reek, MD;  Location: [facility] INVASIVE CV LAB;  Service: Cardiovascular;  Laterality: N/A;   COLONOSCOPY WITH PROPOFOL  N/A 04/14/2022   Procedure: COLONOSCOPY WITH PROPOFOL ;  Surgeon: Albertus Gordy HERO, MD;  Location: [facility] ENDOSCOPY;  Service: Gastroenterology;  Laterality: N/A;   CYST EXCISION     2 on face   CYST REMOVAL NECK     ESOPHAGOGASTRODUODENOSCOPY (EGD) WITH PROPOFOL  N/A 09/14/2014   Procedure: ESOPHAGOGASTRODUODENOSCOPY (EGD) WITH PROPOFOL ;  Surgeon: Lamar JONETTA Aho, MD;  Location: [facility] ENDOSCOPY;  Service: Endoscopy;  Laterality: N/A;   INCISION AND DRAINAGE HIP Left 01/16/2017   Procedure: IRRIGATION AND DEBRIDEMENT LEFT HIP;  Surgeon: Vernetta Lonni GRADE, MD;  Location: [facility] ORS;  Service: Orthopedics;  Laterality: Left;   INTRAMEDULLARY (IM) NAIL INTERTROCHANTERIC Left 11/29/2016   Procedure: INTRAMEDULLARY (IM) NAIL INTERTROCHANTRIC;   Surgeon: Vernetta Lonni GRADE, MD;  Location: [facility] OR;  Service: Orthopedics;  Laterality: Left;   LAPAROSCOPIC LOOP COLOSTOMY N/A 07/17/2023   Procedure: LAPAROSCOPIC DIVERTING LOOP COLOSTOMY;  Surgeon: Belinda Cough, MD;  Location: [facility] OR;  Service: General;  Laterality: N/A;   OVARY SURGERY     RIGHT HEART CATH N/A 08/12/2021   Procedure: RIGHT HEART CATH;  Surgeon: Court Dorn PARAS, MD;  Location: [facility] INVASIVE CV LAB;  Service: Cardiovascular;  Laterality: N/A;   SHOULDER HEMI-ARTHROPLASTY Left 11/15/2023   Procedure: HEMIARTHROPLASTY, SHOULDER;  Surgeon: Cristy Bonner DASEN, MD;  Location: [facility] OR;  Service: Orthopedics;  Laterality: Left;  with Block   SHOULDER SURGERY Left    TONSILLECTOMY  age 63   VAGINAL HYSTERECTOMY     VIDEO BRONCHOSCOPY Bilateral 05/31/2013   Procedure: VIDEO BRONCHOSCOPY WITHOUT FLUORO;  Surgeon: Dorethia Cave, MD;  Location: [facility] ENDOSCOPY;  Service: Cardiopulmonary;  Laterality: Bilateral;   video bronscoscopy  05/19/1998   lung     Social History:   reports that she has never smoked. She has never used smokeless tobacco. She reports that she does not drink alcohol  and does not use drugs.   Family History:  Her family history includes Breast cancer in her sister and sister; Diabetes in her brother, mother, and sister; Esophageal cancer in her brother; Fibroids in her daughter; Gout in her brother; Heart attack in her brother and mother; Heart disease in her brother; Heart murmur in her son; Hypertension in her brother, daughter, daughter, father, and son; Kidney cancer in her brother; Kidney failure in her brother; Lung cancer in her father; Prostate cancer in her brother; Rheum arthritis in her maternal uncle; Stomach cancer in her brother; Uterine cancer in her daughter. There is no history of Alzheimer's disease or Dementia.   Allergies Allergies  Allergen Reactions   Codeine Swelling and Other (See Comments)    Facial and leg swelling Chest pain    Remicade  [Infliximab] Anaphylaxis    sent me into shock    Zestril [Lisinopril] Swelling and Rash    Face and neck swelling    Ofev  [Nintedanib] Diarrhea     Home Medications  Prior to Admission medications   Medication Sig Start Date End Date Taking? Authorizing Provider  acetaminophen  (TYLENOL ) 500 MG tablet Take 1,000 mg by mouth every 8 (eight) hours as needed (pain).   Yes [provider]  albuterol  (VENTOLIN  HFA) 108 (90 Base) MCG/ACT inhaler Inhale 2 puffs into the lungs in the morning and at bedtime.   Yes [provider]  bisacodyl  (DULCOLAX) 10 MG suppository Place 10 mg rectally daily as needed (bowel management).   Yes [provider]  Cholecalciferol  (VITAMIN D3) 50 MCG (2000 UT) TABS Take 2,000 Units by mouth daily.   Yes [provider]  daptomycin  (CUBICIN ) IVPB Inject 700 mg into the vein daily. Indication:  MRSA Bacteremia/septic arthritis of shoulder First Dose: Yes Last Day of Therapy:  12/27/23 Labs - Once weekly:  CBC/D, BMP, and CPK Labs - Once weekly: ESR and CRP Patient taking differently: Inject 700 mg into the vein daily. Indication:  MRSA Bacteremia/septic arthritis of shoulder First Dose: Yes Last Day of Therapy:  12/27/23 Labs - Once weekly:  CBC/D, BMP, and CPK Labs - Once weekly: ESR and CRP Start date 11/23/23 11/22/23 12/31/23 Yes Darci Pore, MD  diclofenac  Sodium (VOLTAREN ) 1 % GEL Apply 2 g topically 3 (three) times daily. 08/04/23  Yes Angiulli, Toribio PARAS, PA-C  DULoxetine  (CYMBALTA ) 60 MG capsule Take 1 capsule (60 mg total) by mouth daily. 08/05/23  Yes Angiulli, Toribio PARAS, PA-C  feeding supplement (ENSURE PLUS HIGH PROTEIN) LIQD Take 237 mLs by mouth 2 (two) times daily between meals. 11/22/23  Yes Darci Pore, MD  furosemide  (LASIX ) 40 MG tablet Take 1 tablet (40 mg total) by mouth daily. 08/05/23  Yes Angiulli, Toribio PARAS, PA-C  gabapentin  (NEURONTIN ) 300 MG capsule Take 1 capsule (300 mg total) by mouth 2  (two) times daily. 08/04/23  Yes Angiulli, Toribio PARAS, PA-C  guaiFENesin  (MUCINEX ) 600 MG 12 hr tablet  Take 2 tablets (1,200 mg total) by mouth 2 (two) times daily. 08/04/23  Yes Angiulli, Toribio PARAS, PA-C  heparin  lock flush 100 UNIT/ML SOLN injection Inject 5 mLs into the vein daily. 11/23/23 12/27/23 Yes [provider]  hydrocortisone  (ANUSOL -HC) 2.5 % rectal cream Place 1 Application rectally 2 (two) times daily.   Yes [provider]  hyoscyamine  (LEVSIN  SL) 0.125 MG SL tablet Place 1 tablet (0.125 mg total) under the tongue every 8 (eight) hours as needed. Patient taking differently: Place 0.125 mg under the tongue every 8 (eight) hours as needed for cramping. 11/04/23  Yes Zehr, Jessica D, PA-C  levothyroxine  (SYNTHROID ) 50 MCG tablet Take 1 tablet (50 mcg total) by mouth daily. 11/28/22  Yes Mercer Clotilda SAUNDERS, MD  methocarbamol  (ROBAXIN ) 500 MG tablet Take 1 tablet (500 mg total) by mouth every 6 (six) hours as needed for muscle spasms (pain). 08/04/23  Yes Angiulli, Toribio PARAS, PA-C  metoprolol  tartrate (LOPRESSOR ) 25 MG tablet Take 2 tablets (50 mg total) by mouth 2 (two) times daily. 11/22/23  Yes Darci Pore, MD  morphine  (MS CONTIN ) 15 MG 12 hr tablet Take 1 tablet (15 mg total) by mouth every 12 (twelve) hours. 11/22/23  Yes Darci Pore, MD  Multiple Vitamin (MULTIVITAMIN) tablet Take 1 tablet by mouth daily.   Yes [provider]  nitroGLYCERIN  (NITROSTAT ) 0.4 MG SL tablet Place 1 tablet (0.4 mg total) under the tongue every 5 (five) minutes as needed for chest pain. 08/04/23  Yes Angiulli, Toribio PARAS, PA-C  oxyCODONE  (OXY IR/ROXICODONE ) 5 MG immediate release tablet Take 1 pills every 6 hrs as needed for severe breakthrough pain Patient taking differently: Take 5 mg by mouth every 6 (six) hours as needed for moderate pain (pain score 4-6) or severe pain (pain score 7-10). 11/17/23  Yes McBane, Aleck SAILOR, PA-C  OXYGEN  Inhale 3 L/min into the lungs at bedtime. As  needed during day time   Yes [provider]  pantoprazole  (PROTONIX ) 40 MG tablet Take 1 tablet (40 mg total) by mouth 2 (two) times daily. 06/23/23  Yes Mercer Clotilda SAUNDERS, MD  predniSONE  (DELTASONE ) 10 MG tablet Take 1 tablet (10 mg total) by mouth daily with breakfast. 10/15/23  Yes Mercer Clotilda SAUNDERS, MD  sulfamethoxazole -trimethoprim  (BACTRIM  DS) 800-160 MG tablet Take 2 tablets by mouth 2 (two) times daily. Patient taking differently: Take 2 tablets by mouth 2 (two) times daily. Start 12/09/23 End 12/10/23 12/09/23  Yes Fleeta Kathie Jomarie SAILOR, MD  atorvastatin  (LIPITOR) 10 MG tablet Take 1 tablet (10 mg total) by mouth daily. Patient not taking: Reported on 12/16/2023 08/14/22   Mercer Clotilda SAUNDERS, MD  leptospermum manuka honey (MEDIHONEY) PSTE paste Apply 1 Application topically daily. Interchangeable with TheraHoney Apply thin layer (3 mm) to wound. Patient not taking: Reported on 12/16/2023 08/15/23   Rai, Nydia POUR, MD  lidocaine  (LIDODERM ) 5 % Place 1 patch onto the skin daily. Remove & Discard patch within 12 hours or as directed by MD Patient not taking: Reported on 12/16/2023 11/09/23   Debby Fidela CROME, NP  potassium chloride  SA (KLOR-CON  M) 20 MEQ tablet Take 2 tablets (40 mEq total) by mouth 2 (two) times daily. Patient not taking: Reported on 12/16/2023 11/22/23   Darci Pore, MD     Critical care time: 0 minutes

## 2023-12-18 NOTE — Progress Notes (Signed)
 Pharmacy Antibiotic Note  [name] is a 83 y.o. female admitted on 12/15/2023 with SOB and PNA.  Patient has a history of septic arthritis on Cubicin  with planned end date of 12/27/23.  She requires BiPAP overnight and transferred to the ICU on 7/31.  Pharmacy has been consulted for vancomycin  dosing.  She is also on Zosyn  and azithromycin .  Renal function now worsening, afebrile, WBC 17.4.  Plan: Change vanc to 1250mg  IV Q48H for AUC 499 using SCr 1.18 Continue Zosyn  EID 3.375gm IV Q8H Monitor renal fxn, micro data, vanc levels as indicated  Height: 5' 4 (162.6 cm) Weight: 81.5 kg (179 lb 10.8 oz) IBW/kg (Calculated) : 54.7  Temp (24hrs), Avg:98 F (36.7 C), Min:97.6 F (36.4 C), Max:98.8 F (37.1 C)  Recent Labs  Lab 12/15/23 1240 12/16/23 0002 12/16/23 1655 12/17/23 0103 12/18/23 0412  WBC 16.3* 17.2* 16.7* 17.4*  --   CREATININE 1.12* 1.00  --  0.86 1.18*    Estimated Creatinine Clearance: 37.9 mL/min (A) (by C-G formula based on SCr of 1.18 mg/dL (H)).    Allergies  Allergen Reactions   Codeine Swelling and Other (See Comments)    Facial and leg swelling Chest pain    Remicade [Infliximab] Anaphylaxis    sent me into shock    Zestril [Lisinopril] Swelling and Rash    Face and neck swelling    Ofev  [Nintedanib] Diarrhea    Azith 7/29 >> 7/30 Doxy x2 doses 7/31 CRO 7/29 >> 7/31 Dapto PTA >> 7/31 Vanc 7/31 >> Zosyn  7/31 >>   7/30 CK = 184   7/29 Group A strep PCR - negative 7/30 BCx - NGTD 7/31 TA -  7/31 resp panel PCR - negative  Daqwan Dougal D. Lendell, PharmD, BCPS, BCCCP 12/18/2023, 11:14 AM

## 2023-12-18 NOTE — Progress Notes (Signed)
 SLP Cancellation Note  Patient Details Name: [name] MRN: [id number] DOB: 09/02/1940   Cancelled treatment:        Attempted to see pt for FEES for further assessment of pharyngeal swallow function. Pt and daughter declined FEES at this time as pt had recently finished lunch and was too full for any further POs.  SLP will reattempt as schedule permits.   Anette FORBES Grippe, MA, CCC-SLP Acute Rehabilitation Services Office: [phone] 12/18/2023, 1:50 PM

## 2023-12-19 DIAGNOSIS — J849 Interstitial pulmonary disease, unspecified: Secondary | ICD-10-CM | POA: Diagnosis not present

## 2023-12-19 DIAGNOSIS — J9601 Acute respiratory failure with hypoxia: Secondary | ICD-10-CM | POA: Diagnosis not present

## 2023-12-19 DIAGNOSIS — A419 Sepsis, unspecified organism: Secondary | ICD-10-CM | POA: Diagnosis not present

## 2023-12-19 LAB — BASIC METABOLIC PANEL WITH GFR
Anion gap: 13 (ref 5–15)
BUN: 30 mg/dL — ABNORMAL HIGH (ref 8–23)
CO2: 23 mmol/L (ref 22–32)
Calcium: 8.1 mg/dL — ABNORMAL LOW (ref 8.9–10.3)
Chloride: 102 mmol/L (ref 98–111)
Creatinine, Ser: 1.3 mg/dL — ABNORMAL HIGH (ref 0.44–1.00)
GFR, Estimated: 41 mL/min — ABNORMAL LOW (ref >60)
Glucose, Bld: 102 mg/dL — ABNORMAL HIGH (ref 70–99)
Potassium: 3.5 mmol/L (ref 3.5–5.1)
Sodium: 138 mmol/L (ref 135–145)

## 2023-12-19 LAB — MAGNESIUM: Magnesium: 1.8 mg/dL (ref 1.7–2.4)

## 2023-12-19 LAB — GLUCOSE, CAPILLARY
Glucose-Capillary: 102 mg/dL — ABNORMAL HIGH (ref 70–99)
Glucose-Capillary: 126 mg/dL — ABNORMAL HIGH (ref 70–99)
Glucose-Capillary: 136 mg/dL — ABNORMAL HIGH (ref 70–99)
Glucose-Capillary: 90 mg/dL (ref 70–99)
Glucose-Capillary: 93 mg/dL (ref 70–99)

## 2023-12-19 MED ORDER — METHOCARBAMOL 500 MG PO TABS
500.0000 mg | ORAL_TABLET | Freq: Three times a day (TID) | ORAL | Status: DC | PRN
  Administered 2023-12-20 – 2023-12-23 (×2): 500 mg via ORAL
  Filled 2023-12-19 (×2): qty 1

## 2023-12-19 MED ORDER — MAGNESIUM SULFATE 2 GM/50ML IV SOLN
2.0000 g | Freq: Once | INTRAVENOUS | Status: AC
  Administered 2023-12-19: 2 g via INTRAVENOUS
  Filled 2023-12-19: qty 50

## 2023-12-19 MED ORDER — OXYCODONE HCL 5 MG PO TABS
5.0000 mg | ORAL_TABLET | Freq: Four times a day (QID) | ORAL | Status: DC | PRN
  Administered 2023-12-19 – 2023-12-20 (×4): 5 mg via ORAL
  Administered 2023-12-20: 10 mg via ORAL
  Administered 2023-12-20: 5 mg via ORAL
  Administered 2023-12-21: 10 mg via ORAL
  Filled 2023-12-19 (×2): qty 1
  Filled 2023-12-19 (×2): qty 2
  Filled 2023-12-19: qty 1
  Filled 2023-12-19: qty 2
  Filled 2023-12-19: qty 1

## 2023-12-19 MED ORDER — PREDNISONE 20 MG PO TABS
30.0000 mg | ORAL_TABLET | Freq: Every day | ORAL | Status: DC
  Administered 2023-12-20 – 2023-12-21 (×2): 30 mg via ORAL
  Filled 2023-12-19 (×2): qty 1

## 2023-12-19 MED ORDER — POTASSIUM CHLORIDE CRYS ER 20 MEQ PO TBCR
40.0000 meq | EXTENDED_RELEASE_TABLET | Freq: Once | ORAL | Status: AC
  Administered 2023-12-19: 40 meq via ORAL
  Filled 2023-12-19: qty 2

## 2023-12-19 NOTE — Progress Notes (Signed)
 [facility] ADULT ICU REPLACEMENT PROTOCOL   The patient does apply for the [facility] Adult ICU Electrolyte Replacment Protocol based on the criteria listed below:   1.Exclusion criteria: TCTS, ECMO, Dialysis, and Myasthenia Gravis patients 2. Is GFR >/= 30 ml/min? Yes.    Patient's GFR today is 41 3. Is SCr </= 2? Yes.   Patient's SCr is 1.3 mg/dL 4. Did SCr increase >/= 0.5 in 24 hours? No. 5.Pt's weight >40kg  Yes.   6. Abnormal electrolyte(s):   K 3.5, Mg 1.8  7. Electrolytes replaced per protocol 8.  Call MD STAT for K+ </= 2.5, Phos </= 1, or Mag </= 1 Physician:  A. Haze Blackbird R Rashard Ryle 12/19/2023 6:22 AM

## 2023-12-19 NOTE — Progress Notes (Signed)
 NAME:  [name], MRN:  [id number], DOB:  [birth year], LOS: 4 ADMISSION DATE:  12/15/2023, CONSULTATION DATE:  12/17/23 REFERRING MD:  Dr. Franky, CHIEF COMPLAINT:  Acute Hypoxic Respiratory Failure   History of Present Illness:  A 83 y.o. female with ILD (?chronic aspiration), chronic hypoxic resp failure on HOT @ 3 L [place] for 20 yrs, restrictive lung disease (mild), septic left shoulder, chronic diastolic heart failure (June 2025, Echo EF 60%, G1DD), ?PE (past hx), HTN, dyslipidemia, hypothyroidism, rheumatoid arthritis, debility, chronic sinus tachycardia, chronic chest pain, and DM-2, who was admitted on 12/15/2023 with progressive SOB and dry cough for 3 days before admission. She was dx with sepsis and community-acquired pneumonia, after presenting from SNF rehab.  She underwent hemiarthroplasty of the left shoulder on 11/15/2023, complicated with septic arthritis, positive for MRSA. Infectious disease was consulted, and the patient was started on daptomycin  via PICC line 11/15/2023, with course of daptomycin  to conclude with final dose to occur on 12/27/2023.   She has no f/c/r, generalized myalgias, or LL edema.   Never smoked. No alcohol  or illicit drug use.  Hospitalist called PCCM team because of wheezing, hypoxia, and being placed on BiPAP 14/8/60%, back up rate 12. IVF was d/c and given IV Lasix  40 mgx1.    Pertinent  Medical History  ILD (?chronic aspiration), chronic hypoxic resp failure on HOT @ 3 L [place] for 20 yrs, restrictive lung disease (mild), septic left shoulder, chronic diastolic heart failure (June 2025, Echo EF 60%, G1DD), ?PE (past hx), HTN, dyslipidemia, hypothyroidism, rheumatoid arthritis, debility, chronic sinus tachycardia, chronic chest pain, DM-2, sigmoid colon stricture s/p colectomy and colostomy 06/2023  Significant Hospital Events: Including procedures, antibiotic start and stop dates in addition to other pertinent events   7/29: admitted for shortness of  breath 7/31: consulted for worsening respiratory status 8/1: remains on HHFNC 8/2 no acute issues overnight remains on 35 L HHFNC  Interim History / Subjective:  Seen lying in bed on high flow nasal cannula reports uncontrolled pain at shoulder  Objective    Blood pressure 135/88, pulse 71, temperature 97.8 F (36.6 C), temperature source Oral, resp. rate 19, height 5' 4 (1.626 m), weight 82.1 kg, SpO2 96%.    FiO2 (%):  [60 %] 60 %   Intake/Output Summary (Last 24 hours) at 12/19/2023 0817 Last data filed at 12/19/2023 0600 Gross per 24 hour  Intake 378.33 ml  Output 1125 ml  Net -746.67 ml   Filed Weights   12/17/23 0750 12/18/23 0425 12/19/23 0500  Weight: 82.4 kg 81.5 kg 82.1 kg    Examination: General: Acute on chronic ill-appearing elderly female lying in bed on high flow nasal cannula in no acute distress HEENT: [place]/AT, MM pink/moist, PERRL,  Neuro: Oriented x 3, nonfocal CV: s1s2 regular rate and rhythm, no murmur, rubs, or gallops,  PULM: Diminished bilaterally, no increased work of breathing, tolerating high flow nasal cannula GI: soft, bowel sounds active in all 4 quadrants, non-tender, non-distended, tolerating TF Extremities: warm/dry, no edema  Skin: no rashes or lesions  Resolved problem list   Assessment and Plan   Acute hypoxemic respiratory failure -Concern for daptomycin  induced eosinophilic pneumonia. Daptomycin  remains on hold. Risk with attempting bronchoscopy is that patient may not be able to come off the vent with a history of chronic respiratory failure, interstitial lung disease at baseline which may now be exacerbated by an eosinophilic pneumonitis -Received 3 days total of atypical coverage - Viral panel negative; pending Legionella  and Strep pneumo urine antigens P: Continue empiric Zosyn  for empiric pneumonia coverage and vancomycin  for septic left shoulder Continues to require high flow nasal cannula at 35 L Will discuss with attending dose  and timing to taper steroids Given continued high flow nasal cannula risk for bronchoscopy remains elevated Encourage pulmonary hygiene Mobilize as able Aspiration precautions   Septic left shoulder MRSA -Daptomycin  (PTA-7/31). Transitioned to Vanc and Zosyn  on 7/31. P: Continue vancomycin  and Zosyn  as above   History of diastolic dysfunction P: Hold home diuretics given AKI as below Continuous telemetry Optimize electrolytes  Acute kidney injury - Creatinine elevated to 1.31 with GFR 41 as of 8/2 this is compared to creatinine 0.86 and GFR greater than 67/31 P: Hold diuretics as above Follow renal function  Management urine output Trend Bmet Avoid nephrotoxins Ensure adequate renal perfusion   Rheumatoid arthritis and interstitial lung disease -Was chronically on prednisone  10 mg daily at home P: Continue elevated dose of prednisone  40 mg daily for now   Hypothyroidism P: Continue home levothyroxine   Hypertension P: Continue home clonidine  and metoprolol  Continuous telemetry   T2DM P: Continue sensitive scale SSI CBG checks every ACHS  Best Practice (right click and Reselect all SmartList Selections daily)   Diet/type: NPO w/ oral meds DVT prophylaxis other Pressure ulcer(s): present on admission  GI prophylaxis: PPI Lines: yes and it is still needed Foley:  N/A Code Status:  full code Last date of multidisciplinary goals of care discussion: Continue to update patient family daily  Critical care time:   Dyesha Henault D. Harris, NP-C [facility] Personal contact information can be found on Amion  If no contact or response made please call [phone] 12/19/2023, 8:29 AM

## 2023-12-20 DIAGNOSIS — J9601 Acute respiratory failure with hypoxia: Secondary | ICD-10-CM | POA: Diagnosis not present

## 2023-12-20 DIAGNOSIS — J849 Interstitial pulmonary disease, unspecified: Secondary | ICD-10-CM | POA: Diagnosis not present

## 2023-12-20 DIAGNOSIS — A419 Sepsis, unspecified organism: Secondary | ICD-10-CM | POA: Diagnosis not present

## 2023-12-20 LAB — GLUCOSE, CAPILLARY
Glucose-Capillary: 89 mg/dL (ref 70–99)
Glucose-Capillary: 85 mg/dL (ref 70–99)
Glucose-Capillary: 74 mg/dL (ref 70–99)
Glucose-Capillary: 116 mg/dL — ABNORMAL HIGH (ref 70–99)
Glucose-Capillary: 121 mg/dL — ABNORMAL HIGH (ref 70–99)
Glucose-Capillary: 114 mg/dL — ABNORMAL HIGH (ref 70–99)

## 2023-12-20 LAB — BASIC METABOLIC PANEL WITH GFR
Anion gap: 12 (ref 5–15)
BUN: 28 mg/dL — ABNORMAL HIGH (ref 8–23)
CO2: 23 mmol/L (ref 22–32)
Calcium: 8.1 mg/dL — ABNORMAL LOW (ref 8.9–10.3)
Chloride: 101 mmol/L (ref 98–111)
Creatinine, Ser: 0.97 mg/dL (ref 0.44–1.00)
GFR, Estimated: 58 mL/min — ABNORMAL LOW (ref >60)
Glucose, Bld: 87 mg/dL (ref 70–99)
Potassium: 4.3 mmol/L (ref 3.5–5.1)
Sodium: 136 mmol/L (ref 135–145)

## 2023-12-20 LAB — CBC
HCT: 27 % — ABNORMAL LOW (ref 36.0–46.0)
Hemoglobin: 8.3 g/dL — ABNORMAL LOW (ref 12.0–15.0)
MCH: 28.4 pg (ref 26.0–34.0)
MCHC: 30.7 g/dL (ref 30.0–36.0)
MCV: 92.5 fL (ref 80.0–100.0)
Platelets: 271 K/uL (ref 150–400)
RBC: 2.92 MIL/uL — ABNORMAL LOW (ref 3.87–5.11)
RDW: 16.2 % — ABNORMAL HIGH (ref 11.5–15.5)
WBC: 6.2 K/uL (ref 4.0–10.5)
nRBC: 0 % (ref 0.0–0.2)

## 2023-12-20 LAB — MAGNESIUM: Magnesium: 2 mg/dL (ref 1.7–2.4)

## 2023-12-20 MED ORDER — FUROSEMIDE 40 MG PO TABS
20.0000 mg | ORAL_TABLET | Freq: Every day | ORAL | Status: DC
  Administered 2023-12-20 – 2023-12-23 (×4): 20 mg via ORAL
  Filled 2023-12-20 (×4): qty 1

## 2023-12-20 NOTE — Progress Notes (Addendum)
 NAME:  [name], MRN:  [id number], DOB:  07/25/1940, LOS: 5 ADMISSION DATE:  12/15/2023, CONSULTATION DATE:  12/17/23 REFERRING MD:  Dr. Franky, CHIEF COMPLAINT:  Acute Hypoxic Respiratory Failure   History of Present Illness:  A 83 y.o. female with ILD (?chronic aspiration), chronic hypoxic resp failure on HOT @ 3 L [place] for 20 yrs, restrictive lung disease (mild), septic left shoulder, chronic diastolic heart failure (June 2025, Echo EF 60%, G1DD), ?PE (past hx), HTN, dyslipidemia, hypothyroidism, rheumatoid arthritis, debility, chronic sinus tachycardia, chronic chest pain, and DM-2, who was admitted on 12/15/2023 with progressive SOB and dry cough for 3 days before admission. She was dx with sepsis and community-acquired pneumonia, after presenting from SNF rehab.  She underwent hemiarthroplasty of the left shoulder on 11/15/2023, complicated with septic arthritis, positive for MRSA. Infectious disease was consulted, and the patient was started on daptomycin  via PICC line 11/15/2023, with course of daptomycin  to conclude with final dose to occur on 12/27/2023.   She has no f/c/r, generalized myalgias, or LL edema.   Never smoked. No alcohol  or illicit drug use.  Hospitalist called PCCM team because of wheezing, hypoxia, and being placed on BiPAP 14/8/60%, back up rate 12. IVF was d/c and given IV Lasix  40 mgx1.    Pertinent  Medical History  ILD (?chronic aspiration), chronic hypoxic resp failure on HOT @ 3 L [place] for 20 yrs, restrictive lung disease (mild), septic left shoulder, chronic diastolic heart failure (June 2025, Echo EF 60%, G1DD), ?PE (past hx), HTN, dyslipidemia, hypothyroidism, rheumatoid arthritis, debility, chronic sinus tachycardia, chronic chest pain, DM-2, sigmoid colon stricture s/p colectomy and colostomy 06/2023  Significant Hospital Events: Including procedures, antibiotic start and stop dates in addition to other pertinent events   7/29: admitted for shortness of  breath 7/31: consulted for worsening respiratory status 8/1: remains on HHFNC 8/2 no acute issues overnight remains on 35 L HHFNC 8/3 remains on heated high flow nasal cannula FiO2 down to 59 but flow is back up to 40 L  Interim History / Subjective:  States that she feels better this a.m. with less fatigue  Objective    Blood pressure (!) 144/75, pulse 72, temperature 98.3 F (36.8 C), temperature source Oral, resp. rate 16, height 5' 4 (1.626 m), weight 82.1 kg, SpO2 97%.    FiO2 (%):  [59 %-63 %] 59 %   Intake/Output Summary (Last 24 hours) at 12/20/2023 9187 Last data filed at 12/20/2023 0524 Gross per 24 hour  Intake 599.17 ml  Output 550 ml  Net 49.17 ml   Filed Weights   12/17/23 0750 12/18/23 0425 12/19/23 0500  Weight: 82.4 kg 81.5 kg 82.1 kg    Examination: General: Acute on chronic ill appearing elderly female lying in bed on heated high flow nasal cannula in no acute distress  HEENT: [place]/AT, MM pink/moist, PERRL,  Neuro: Alert and oriented x 3, nonfocal CV: s1s2 regular rate and rhythm, no murmur, rubs, or gallops,  PULM: Diminished bilaterally, no increased work of breathing, no added breath sounds acute hyponatremia GI: soft, bowel sounds active in all 4 quadrants, non-tender, non-distended, tolerating oral diet Extremities: warm/dry, no edema  Skin: no rashes or lesions  Resolved problem list   Assessment and Plan   Acute hypoxemic respiratory failure -Concern for daptomycin  induced eosinophilic pneumonia. Daptomycin  remains on hold. Risk with attempting bronchoscopy is that patient may not be able to come off the vent with a history of chronic respiratory failure, interstitial lung  disease at baseline which may now be exacerbated by an eosinophilic pneumonitis -Received 3 days total of atypical coverage - Viral panel negative; pending Legionella and Strep pneumo urine antigens P: Remains on empiric Zosyn  for pneumonia coverage and vancomycin  for septic left  shoulder Continue 30 mg prednisone  daily Continue high flow nasal cannula, wean as able Remains too high risk for bronc Encourage pulmonary hygiene Mobilize as able Aspiration precautions   Septic left shoulder MRSA -Daptomycin  (PTA-7/31). Transitioned to Vanc and Zosyn  on 7/31. P: Continue vancomycin  as above   History of diastolic dysfunction P: Resume home diuretics at reduced dose Continuous telemetry Optimize electrolytes  Acute kidney injury -improved - Creatinine elevated to 1.31 with GFR 41 as of 8/2 this is compared to creatinine 0.86 and GFR greater than 67/31.  By 8/3 creatinine back down to 0.97 with GFR 58 P: Resume home diuretics at reduced dose Follow renal function Monitor urine output Trend [place] Avoid nephrotoxins Ensure adequate renal perfusion  Rheumatoid arthritis and interstitial lung disease -Was chronically on prednisone  10 mg daily at home P: Continue prednisone  30 mg daily, wean as able   Hypothyroidism P: Continue home levothyroxine   Hypertension P: Remains on home clonidine  and metoprolol    T2DM P: Continue sensitive scale SSI CBG checks ACHS CBG goal 140-180  Best Practice (right click and Reselect all SmartList Selections daily)   Diet/type: NPO w/ oral meds DVT prophylaxis other Pressure ulcer(s): present on admission  GI prophylaxis: PPI Lines: yes and it is still needed Foley:  N/A Code Status:  full code Last date of multidisciplinary goals of care discussion: Continue to update patient family daily  Critical care time:   Deliliah Spranger D. Harris, NP-C [facility] Personal contact information can be found on Amion  If no contact or response made please call [phone] 12/20/2023, 8:12 AM

## 2023-12-20 NOTE — Progress Notes (Signed)
 SLP Cancellation Note  Patient Details Name: [name] MRN: [id number] DOB: 20-Apr-1941   Cancelled treatment:       Reason Eval/Treat Not Completed: Patient declined (Pt was educated regarding FEES procedure and consent was signed. However, once the scope touched the mucosa of the left nare, pt reported that it was too painful and requested that it be discontinued. With encouragement, and given a break, pt was amenable to trying again; SLP was able to advance scope slightly further, but pt ultimately stated that the procedure was too painful and requested that it be discontinued. SLP offered to attempt the right nare, but pt declined stating that it is congested. Pt stated that she would be willing to complete an MBS, but understands that this cannot be conducted while she is on HFNC. SLP will follow briefly to assess improvement in her respiratory status with the intent to proceed with an MBS if she becomes a candidate.)  Lilliemae Fruge I. [place], MS, CCC-SLP Acute Rehabilitation Services Office number [phone]  Thea LILLETTE [place] 12/20/2023, 3:18 PM

## 2023-12-21 LAB — BASIC METABOLIC PANEL WITH GFR
Anion gap: 10 (ref 5–15)
BUN: 18 mg/dL (ref 8–23)
CO2: 24 mmol/L (ref 22–32)
Calcium: 7.9 mg/dL — ABNORMAL LOW (ref 8.9–10.3)
Chloride: 100 mmol/L (ref 98–111)
Creatinine, Ser: 0.79 mg/dL (ref 0.44–1.00)
GFR, Estimated: >60 mL/min (ref >60)
Glucose, Bld: 106 mg/dL — ABNORMAL HIGH (ref 70–99)
Potassium: 3.2 mmol/L — ABNORMAL LOW (ref 3.5–5.1)
Sodium: 134 mmol/L — ABNORMAL LOW (ref 135–145)

## 2023-12-21 LAB — CULTURE, BLOOD (ROUTINE X 2)
Culture: NO GROWTH
Culture: NO GROWTH
Special Requests: ADEQUATE
Special Requests: ADEQUATE

## 2023-12-21 LAB — GLUCOSE, CAPILLARY
Glucose-Capillary: 93 mg/dL (ref 70–99)
Glucose-Capillary: 75 mg/dL (ref 70–99)
Glucose-Capillary: 101 mg/dL — ABNORMAL HIGH (ref 70–99)
Glucose-Capillary: 174 mg/dL — ABNORMAL HIGH (ref 70–99)
Glucose-Capillary: 261 mg/dL — ABNORMAL HIGH (ref 70–99)
Glucose-Capillary: 148 mg/dL — ABNORMAL HIGH (ref 70–99)

## 2023-12-21 MED ORDER — VANCOMYCIN HCL IN DEXTROSE 1-5 GM/200ML-% IV SOLN
1000.0000 mg | INTRAVENOUS | Status: DC
  Administered 2023-12-21 – 2023-12-23 (×3): 1000 mg via INTRAVENOUS
  Filled 2023-12-21 (×3): qty 200

## 2023-12-21 MED ORDER — METHYLPREDNISOLONE SODIUM SUCC 125 MG IJ SOLR
125.0000 mg | Freq: Four times a day (QID) | INTRAMUSCULAR | Status: AC
  Administered 2023-12-21 – 2023-12-23 (×8): 125 mg via INTRAVENOUS
  Filled 2023-12-21 (×8): qty 2

## 2023-12-21 MED ORDER — MORPHINE SULFATE 15 MG PO TABS
15.0000 mg | ORAL_TABLET | ORAL | Status: DC | PRN
  Administered 2023-12-21 – 2023-12-23 (×3): 15 mg via ORAL
  Filled 2023-12-21 (×4): qty 1

## 2023-12-21 MED ORDER — POTASSIUM CHLORIDE 20 MEQ PO PACK
40.0000 meq | PACK | ORAL | Status: AC
  Administered 2023-12-21 (×2): 40 meq via ORAL
  Filled 2023-12-21 (×2): qty 2

## 2023-12-21 MED ORDER — MORPHINE SULFATE (PF) 2 MG/ML IV SOLN
2.0000 mg | INTRAVENOUS | Status: DC | PRN
  Administered 2023-12-21 – 2023-12-22 (×3): 2 mg via INTRAVENOUS
  Filled 2023-12-21 (×3): qty 1

## 2023-12-21 MED ORDER — SULFAMETHOXAZOLE-TRIMETHOPRIM 800-160 MG PO TABS
1.0000 | ORAL_TABLET | ORAL | Status: DC
  Administered 2023-12-21 – 2023-12-23 (×2): 1 via ORAL
  Filled 2023-12-21 (×3): qty 1

## 2023-12-21 NOTE — Plan of Care (Signed)
  Problem: Education: Goal: Knowledge of General Education information will improve Description: Including pain rating scale, medication(s)/side effects and non-pharmacologic comfort measures Outcome: Progressing   Problem: Clinical Measurements: Goal: Ability to maintain clinical measurements within normal limits will improve Outcome: Progressing   Problem: Clinical Measurements: Goal: Will remain free from infection Outcome: Progressing   Problem: Clinical Measurements: Goal: Diagnostic test results will improve Outcome: Progressing   Problem: Clinical Measurements: Goal: Respiratory complications will improve Outcome: Progressing   Problem: Clinical Measurements: Goal: Cardiovascular complication will be avoided Outcome: Progressing   Problem: Pain Managment: Goal: General experience of comfort will improve and/or be controlled Outcome: Progressing   Problem: Safety: Goal: Ability to remain free from injury will improve Outcome: Progressing   Problem: Skin Integrity: Goal: Risk for impaired skin integrity will decrease Outcome: Progressing   Problem: Respiratory: Goal: Ability to maintain adequate ventilation will improve Outcome: Progressing   Problem: Respiratory: Goal: Ability to maintain a clear airway will improve Outcome: Progressing   Problem: Tissue Perfusion: Goal: Adequacy of tissue perfusion will improve Outcome: Progressing   Plan of care, assessment, treatment, monitoring, and intervention (s) ongoing, see MAR see flowsheet

## 2023-12-21 NOTE — Progress Notes (Signed)
 Pharmacy Antibiotic Note  [name] is a 83 y.o. female admitted on 12/15/2023 with SOB and PNA.  Patient has a history of septic arthritis on Cubicin  with planned end date of 12/27/23.  She required BiPAP overnight and was transferred to the ICU on 7/31.  Pharmacy was been consulted for vancomycin  dosing.  She is also on Zosyn .  Renal function improved (Scr 0.97), afebrile, WBC 6.2.  Plan: Change vanc to 1000mg  IV Q24H for AUC 473 using SCr 0.97 Continue Zosyn  EID 3.375gm IV Q8H Monitor renal fxn, micro data, vanc levels as indicated  Height: 5' 4 (162.6 cm) Weight: 80.5 kg (177 lb 7.5 oz) IBW/kg (Calculated) : 54.7  Temp (24hrs), Avg:97.9 F (36.6 C), Min:97 F (36.1 C), Max:98.5 F (36.9 C)  Recent Labs  Lab 12/15/23 1240 12/16/23 0002 12/16/23 1655 12/17/23 0103 12/18/23 0412 12/19/23 0343 12/20/23 0410  WBC 16.3* 17.2* 16.7* 17.4*  --   --  6.2  CREATININE 1.12* 1.00  --  0.86 1.18* 1.30* 0.97    Estimated Creatinine Clearance: 45.9 mL/min (by C-G formula based on SCr of 0.97 mg/dL).    Allergies  Allergen Reactions   Codeine Swelling and Other (See Comments)    Facial and leg swelling Chest pain    Remicade [Infliximab] Anaphylaxis    sent me into shock    Zestril [Lisinopril] Swelling and Rash    Face and neck swelling    Ofev  [Nintedanib] Diarrhea    Azith 7/29 >> 7/30 Doxy x2 doses 7/31 CRO 7/29 >> 7/31 Dapto PTA >> 7/31 Vanc 7/31 >> Zosyn  7/31 >>   7/30 CK = 184   7/29 Group A strep PCR - negative 7/30 BCx - NGTD 7/31 TA -  7/31 resp panel PCR - negative  Rankin Sams, PharmD, BCPS, BCCCP Clinical Pharmacist

## 2023-12-21 NOTE — Progress Notes (Signed)
 NAME:  [name], MRN:  [id number], DOB:  [birth year], LOS: 6 ADMISSION DATE:  12/15/2023, CONSULTATION DATE:  12/17/23 REFERRING MD:  Dr. Franky, CHIEF COMPLAINT:  Acute Hypoxic Respiratory Failure   History of Present Illness:  A 83 y.o. female with ILD (?chronic aspiration), chronic hypoxic resp failure on HOT @ 3 L [place] for 20 yrs, restrictive lung disease (mild), septic left shoulder, chronic diastolic heart failure (June 2025, Echo EF 60%, G1DD), ?PE (past hx), HTN, dyslipidemia, hypothyroidism, rheumatoid arthritis, debility, chronic sinus tachycardia, chronic chest pain, and DM-2, who was admitted on 12/15/2023 with progressive SOB and dry cough for 3 days before admission. She was dx with sepsis and community-acquired pneumonia, after presenting from SNF rehab.  She underwent hemiarthroplasty of the left shoulder on 11/15/2023, complicated with septic arthritis, positive for MRSA. Infectious disease was consulted, and the patient was started on daptomycin  via PICC line 11/15/2023, with course of daptomycin  to conclude with final dose to occur on 12/27/2023.   She has no f/c/r, generalized myalgias, or LL edema.   Never smoked. No alcohol  or illicit drug use.  Hospitalist called PCCM team because of wheezing, hypoxia, and being placed on BiPAP 14/8/60%, back up rate 12. IVF was d/c and given IV Lasix  40 mgx1.  Pertinent  Medical History  ILD (?chronic aspiration), chronic hypoxic resp failure on HOT @ 3 L [place] for 20 yrs, restrictive lung disease (mild), septic left shoulder, chronic diastolic heart failure (June 2025, Echo EF 60%, G1DD), ?PE (past hx), HTN, dyslipidemia, hypothyroidism, rheumatoid arthritis, debility, chronic sinus tachycardia, chronic chest pain, DM-2, sigmoid colon stricture s/p colectomy and colostomy 06/2023   Significant Hospital Events: Including procedures, antibiotic start and stop dates in addition to other pertinent events   7/29: admitted for shortness of  breath 7/31: consulted for worsening respiratory status 8/1: remains on HHFNC 8/2 no acute issues overnight remains on 35 L HHFNC 8/3 remains on heated high flow nasal cannula FiO2 down to 59 but flow is back up to 40 L 8/4 remains on HHFNC, FiO2 down to 40  Interim History / Subjective:  Stable on HHFNC, but is expressing some chest discomfort related to her breathing.  Notable weakness on ambulation and even keeping her head up in bed.  Objective    Blood pressure 131/75, pulse 70, temperature 98.2 F (36.8 C), temperature source Oral, resp. rate (!) 23, height 5' 4 (1.626 m), weight 80.5 kg, SpO2 96%.    FiO2 (%):  [50 %-60 %] 50 %   Intake/Output Summary (Last 24 hours) at 12/21/2023 0738 Last data filed at 12/21/2023 0600 Gross per 24 hour  Intake 288.11 ml  Output 850 ml  Net -561.89 ml   Filed Weights   12/18/23 0425 12/19/23 0500 12/21/23 0554  Weight: 81.5 kg 82.1 kg 80.5 kg    Examination: General: Awake and Alert in NAD HEENT: NCAT. Sclera anicteric. No rhinorrhea. Cardiovascular: RRR. No M/R/G Respiratory: CTAB, normal WOB on HHFNC. No wheezing, crackles, rhonchi, or diminished breath sounds. Abdomen: Soft, non-tender, non-distended. Bowel sounds normoactive Extremities: Able to move all extremities. No BLE edema, no deformities or significant joint findings. Skin: Warm and dry. Neuro: A&Ox3. No focal neurological deficits.  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 Daptomycin  remains on hold. Risk with attempting bronchoscopy is that patient may not be able to come off the vent with a history of chronic respiratory failure, interstitial lung disease  at baseline which may now be exacerbated by an eosinophilic pneumonitis. Received 3 days total of atypical coverage Viral panel negative; pending Legionella and Strep pneumo urine antigens P: Remains on empiric Zosyn  for  pneumonia coverage and vancomycin  for septic left shoulder - Continue 30 mg prednisone  daily, likely will continue increased dose during acute phase - Continue HHNC, wean as able - Remains too high risk for bronc - Encourage incentive spirometry - Mobilize as able - PT/OT eval and treat - Aspiration precautions   Septic left shoulder MRSA Daptomycin  (PTA-7/31). Transitioned to Vanc (7/31-8/10) and Zosyn  on (7/31 - 8/4). P: - Continue vancomycin  as above  History of diastolic dysfunction P: - Resume home diuretics at reduced dose of Lasix  20 mg daily - Continuous telemetry - Optimize electrolytes   Acute kidney injury - improved Creatinine elevated to 1.31 with GFR 41 as of 8/2 this is compared to creatinine 0.86 and GFR greater than 67/31.  By 8/3 creatinine back down to 0.97 with GFR 58. P: - Continue diuresis w/ improved AKI - Monitor kidney function with BMP - Monitor urine output - Avoid nephrotoxins - Ensure adequate renal perfusion  Rheumatoid arthritis and interstitial lung disease Was chronically on prednisone  10 mg daily at home. P: - Continue prednisone  30 mg daily during acute illness, wean as able   Hypothyroidism P: - Continue home levothyroxine  50 mcg daily   Hypertension P: - Remains on home clonidine  0.1 mg TID and metoprolol  100 mg BID   T2DM CBGs stable. P: - Continue sensitive scale SSI - CBG checks ACHS - CBG goal 140-180  Best Practice (right click and Reselect all SmartList Selections daily)   Diet/type: Regular consistency (see orders) and NPO w/ oral meds DVT prophylaxis other Pressure ulcer(s): present on admission  GI prophylaxis: PPI Lines: yes and it is still needed Foley:  N/A and removal ordered  Code Status:  full code Last date of multidisciplinary goals of care discussion: Continue to update family daily  Labs   CBC: Recent Labs  Lab 12/15/23 1240 12/16/23 0002 12/16/23 1655 12/17/23 0103 12/20/23 0410  WBC 16.3*  17.2* 16.7* 17.4* 6.2  NEUTROABS 13.5* 13.6*  --   --   --   HGB 9.8* 9.5* 9.1* 10.0* 8.3*  HCT 32.3* 30.3* 29.3* 32.1* 27.0*  MCV 94.7 91.0 92.7 92.5 92.5  PLT 226 243 241 275 271    Basic Metabolic Panel: Recent Labs  Lab 12/15/23 1430 12/16/23 0002 12/17/23 0103 12/18/23 0412 12/19/23 0343 12/20/23 0410  NA  --  138 140 139 138 136  K  --  3.6 3.6 3.3* 3.5 4.3  CL  --  102 104 103 102 101  CO2  --  26 25 23 23 23   GLUCOSE  --  92 99 138* 102* 87  BUN  --  25* 18 23 30* 28*  CREATININE  --  1.00 0.86 1.18* 1.30* 0.97  CALCIUM   --  8.2* 8.1* 8.3* 8.1* 8.1*  MG 1.9 1.9  --   --  1.8 2.0  PHOS  --  3.1  --   --   --   --    GFR: Estimated Creatinine Clearance: 45.9 mL/min (by C-G formula based on SCr of 0.97 mg/dL). Recent Labs  Lab 12/16/23 0002 12/16/23 1655 12/17/23 0103 12/17/23 0909 12/20/23 0410  PROCALCITON 0.42  --   --  0.48  --   WBC 17.2* 16.7* 17.4*  --  6.2    Liver Function Tests:  Recent Labs  Lab 12/15/23 1240 12/16/23 0002 12/17/23 0103  AST 25 21 34  ALT 27 26 25   ALKPHOS 181* 152* 177*  BILITOT 1.0 0.7 0.9  PROT 5.9* 5.1* 5.8*  ALBUMIN  2.0* 2.0* 1.9*   No results for input(s): LIPASE, AMYLASE in the last 168 hours. No results for input(s): AMMONIA in the last 168 hours.  ABG    Component Value Date/Time   PHART 7.460 (H) 03/30/2021 1638   PCO2ART 46.6 03/30/2021 1638   PO2ART 70 (L) 03/30/2021 1638   HCO3 27.8 12/17/2023 0118   TCO2 24 07/15/2023 1215   O2SAT 52.5 12/17/2023 0118     Coagulation Profile: No results for input(s): INR, PROTIME in the last 168 hours.  Cardiac Enzymes: Recent Labs  Lab 12/16/23 0002  CKTOTAL 184    HbA1C: Hemoglobin A1C  Date/Time Value Ref Range Status  10/15/2023 02:48 PM 6.0 (A) 4.0 - 5.6 % Final   Hgb A1c MFr Bld  Date/Time Value Ref Range Status  12/18/2023 04:12 AM 5.5 4.8 - 5.6 % Final    Comment:    (NOTE)         Prediabetes: 5.7 - 6.4         Diabetes: >6.4          Glycemic control for adults with diabetes: <7.0   06/14/2023 03:16 AM 5.6 4.8 - 5.6 % Final    Comment:    (NOTE) Pre diabetes:          5.7%-6.4%  Diabetes:              >6.4%  Glycemic control for   <7.0% adults with diabetes     CBG: Recent Labs  Lab 12/20/23 1506 12/20/23 1910 12/20/23 2301 12/21/23 0304 12/21/23 0707  GLUCAP 116* 121* 114* 93 75    Review of Systems:   As above  Past Medical History:  She,  has a past medical history of Acute encephalopathy (03/30/2021), Acute kidney injury superimposed on chronic kidney disease (HCC) (04/17/2015), Acute on chronic diastolic (congestive) heart failure (HCC), Anemia, Anxiety, Arthritis, Asthma, Carotid artery occlusion, Chest pain of uncertain etiology (01/28/2012), Chronic fatigue, Chronic kidney disease, Closed intertrochanteric fracture of hip, left, initial encounter (HCC) (11/29/2016), Closed left hip fracture (HCC), Closed nondisplaced intertrochanteric fracture of left femur with delayed healing (01/14/2017), Clotting disorder (HCC), Contusion of left knee, COPD (chronic obstructive pulmonary disease) (HCC), Coronary artery disease, Depression, reactive, Diabetes mellitus, Diastolic dysfunction, Difficulty in walking, Family history of heart disease, Flu (06/26/2023), Generalized muscle weakness, Gout, Hardware complicating wound infection (HCC) (12/08/2023), High cholesterol, History of falling, Hypertension, Hypothyroidism, Interstitial lung disease (HCC), 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   Surgical History:   Past Surgical History:  Procedure Laterality  Date   BRAIN SURGERY     Gamma knife 10/13. Needs repeat spring  '14   CARDIAC CATHETERIZATION N/A 10/31/2014   Procedure: Right/Left Heart Cath and Coronary Angiography;  Surgeon: Candyce GORMAN Reek, MD;  Location: [facility] INVASIVE CV LAB;  Service: Cardiovascular;  Laterality: N/A;   COLONOSCOPY WITH PROPOFOL  N/A 04/14/2022   Procedure: COLONOSCOPY WITH PROPOFOL ;  Surgeon: Albertus Gordy HERO, MD;  Location: [facility] ENDOSCOPY;  Service: Gastroenterology;  Laterality: N/A;   CYST EXCISION  2 on face   CYST REMOVAL NECK     ESOPHAGOGASTRODUODENOSCOPY (EGD) WITH PROPOFOL  N/A 09/14/2014   Procedure: ESOPHAGOGASTRODUODENOSCOPY (EGD) WITH PROPOFOL ;  Surgeon: Lamar JONETTA Aho, MD;  Location: [facility] ENDOSCOPY;  Service: Endoscopy;  Laterality: N/A;   INCISION AND DRAINAGE HIP Left 01/16/2017   Procedure: IRRIGATION AND DEBRIDEMENT LEFT HIP;  Surgeon: Vernetta Lonni GRADE, MD;  Location: [facility] ORS;  Service: Orthopedics;  Laterality: Left;   INTRAMEDULLARY (IM) NAIL INTERTROCHANTERIC Left 11/29/2016   Procedure: INTRAMEDULLARY (IM) NAIL INTERTROCHANTRIC;  Surgeon: Vernetta Lonni GRADE, MD;  Location: [facility] OR;  Service: Orthopedics;  Laterality: Left;   LAPAROSCOPIC LOOP COLOSTOMY N/A 07/17/2023   Procedure: LAPAROSCOPIC DIVERTING LOOP COLOSTOMY;  Surgeon: Belinda Cough, MD;  Location: [facility] OR;  Service: General;  Laterality: N/A;   OVARY SURGERY     RIGHT HEART CATH N/A 08/12/2021   Procedure: RIGHT HEART CATH;  Surgeon: Court Dorn PARAS, MD;  Location: [facility] INVASIVE CV LAB;  Service: Cardiovascular;  Laterality: N/A;   SHOULDER HEMI-ARTHROPLASTY Left 11/15/2023   Procedure: HEMIARTHROPLASTY, SHOULDER;  Surgeon: Cristy Bonner DASEN, MD;  Location: [facility] OR;  Service: Orthopedics;  Laterality: Left;  with Block   SHOULDER SURGERY Left    TONSILLECTOMY  age 24   VAGINAL HYSTERECTOMY     VIDEO BRONCHOSCOPY Bilateral 05/31/2013   Procedure: VIDEO BRONCHOSCOPY WITHOUT FLUORO;  Surgeon: Dorethia Cave, MD;  Location: [facility]  ENDOSCOPY;  Service: Cardiopulmonary;  Laterality: Bilateral;   video bronscoscopy  05/19/1998   lung     Social History:   reports that she has never smoked. She has never used smokeless tobacco. She reports that she does not drink alcohol  and does not use drugs.   Family History:  Her family history includes Breast cancer in her sister and sister; Diabetes in her brother, mother, and sister; Esophageal cancer in her brother; Fibroids in her daughter; Gout in her brother; Heart attack in her brother and mother; Heart disease in her brother; Heart murmur in her son; Hypertension in her brother, daughter, daughter, father, and son; Kidney cancer in her brother; Kidney failure in her brother; Lung cancer in her father; Prostate cancer in her brother; Rheum arthritis in her maternal uncle; Stomach cancer in her brother; Uterine cancer in her daughter. There is no history of Alzheimer's disease or Dementia.   Allergies Allergies  Allergen Reactions   Codeine Swelling and Other (See Comments)    Facial and leg swelling Chest pain    Remicade [Infliximab] Anaphylaxis    sent me into shock    Zestril [Lisinopril] Swelling and Rash    Face and neck swelling    Ofev  [Nintedanib] Diarrhea     Home Medications  Prior to Admission medications   Medication Sig Start Date End Date Taking? Authorizing Provider  acetaminophen  (TYLENOL ) 500 MG tablet Take 1,000 mg by mouth every 8 (eight) hours as needed (pain).   Yes [provider]  albuterol  (VENTOLIN  HFA) 108 (90 Base) MCG/ACT inhaler Inhale 2 puffs into the lungs in the morning and at bedtime.   Yes [provider]  bisacodyl  (DULCOLAX) 10 MG suppository Place 10 mg rectally daily as needed (bowel management).   Yes [provider]  Cholecalciferol  (VITAMIN D3) 50 MCG (2000 UT) TABS Take 2,000 Units by mouth daily.   Yes [provider]  daptomycin  (CUBICIN ) IVPB Inject 700 mg into the vein daily.  Indication:  MRSA Bacteremia/septic arthritis of shoulder First Dose: Yes Last Day of Therapy:  12/27/23 Labs - Once  weekly:  CBC/D, BMP, and CPK Labs - Once weekly: ESR and CRP Patient taking differently: Inject 700 mg into the vein daily. Indication:  MRSA Bacteremia/septic arthritis of shoulder First Dose: Yes Last Day of Therapy:  12/27/23 Labs - Once weekly:  CBC/D, BMP, and CPK Labs - Once weekly: ESR and CRP Start date 11/23/23 11/22/23 12/31/23 Yes Darci Pore, MD  diclofenac  Sodium (VOLTAREN ) 1 % GEL Apply 2 g topically 3 (three) times daily. 08/04/23  Yes Angiulli, Toribio PARAS, PA-C  DULoxetine  (CYMBALTA ) 60 MG capsule Take 1 capsule (60 mg total) by mouth daily. 08/05/23  Yes Angiulli, Toribio PARAS, PA-C  feeding supplement (ENSURE PLUS HIGH PROTEIN) LIQD Take 237 mLs by mouth 2 (two) times daily between meals. 11/22/23  Yes Darci Pore, MD  furosemide  (LASIX ) 40 MG tablet Take 1 tablet (40 mg total) by mouth daily. 08/05/23  Yes Angiulli, Toribio PARAS, PA-C  gabapentin  (NEURONTIN ) 300 MG capsule Take 1 capsule (300 mg total) by mouth 2 (two) times daily. 08/04/23  Yes Angiulli, Toribio PARAS, PA-C  guaiFENesin  (MUCINEX ) 600 MG 12 hr tablet Take 2 tablets (1,200 mg total) by mouth 2 (two) times daily. 08/04/23  Yes Angiulli, Toribio PARAS, PA-C  heparin  lock flush 100 UNIT/ML SOLN injection Inject 5 mLs into the vein daily. 11/23/23 12/27/23 Yes [provider]  hydrocortisone  (ANUSOL -HC) 2.5 % rectal cream Place 1 Application rectally 2 (two) times daily.   Yes [provider]  hyoscyamine  (LEVSIN  SL) 0.125 MG SL tablet Place 1 tablet (0.125 mg total) under the tongue every 8 (eight) hours as needed. Patient taking differently: Place 0.125 mg under the tongue every 8 (eight) hours as needed for cramping. 11/04/23  Yes Zehr, Jessica D, PA-C  levothyroxine  (SYNTHROID ) 50 MCG tablet Take 1 tablet (50 mcg total) by mouth daily. 11/28/22  Yes Mercer Clotilda SAUNDERS, MD  methocarbamol  (ROBAXIN )  500 MG tablet Take 1 tablet (500 mg total) by mouth every 6 (six) hours as needed for muscle spasms (pain). 08/04/23  Yes Angiulli, Toribio PARAS, PA-C  metoprolol  tartrate (LOPRESSOR ) 25 MG tablet Take 2 tablets (50 mg total) by mouth 2 (two) times daily. 11/22/23  Yes Darci Pore, MD  morphine  (MS CONTIN ) 15 MG 12 hr tablet Take 1 tablet (15 mg total) by mouth every 12 (twelve) hours. 11/22/23  Yes Darci Pore, MD  Multiple Vitamin (MULTIVITAMIN) tablet Take 1 tablet by mouth daily.   Yes [provider]  nitroGLYCERIN  (NITROSTAT ) 0.4 MG SL tablet Place 1 tablet (0.4 mg total) under the tongue every 5 (five) minutes as needed for chest pain. 08/04/23  Yes Angiulli, Toribio PARAS, PA-C  oxyCODONE  (OXY IR/ROXICODONE ) 5 MG immediate release tablet Take 1 pills every 6 hrs as needed for severe breakthrough pain Patient taking differently: Take 5 mg by mouth every 6 (six) hours as needed for moderate pain (pain score 4-6) or severe pain (pain score 7-10). 11/17/23  Yes McBane, Aleck SAILOR, PA-C  OXYGEN  Inhale 3 L/min into the lungs at bedtime. As needed during day time   Yes [provider]  pantoprazole  (PROTONIX ) 40 MG tablet Take 1 tablet (40 mg total) by mouth 2 (two) times daily. 06/23/23  Yes Mercer Clotilda SAUNDERS, MD  predniSONE  (DELTASONE ) 10 MG tablet Take 1 tablet (10 mg total) by mouth daily with breakfast. 10/15/23  Yes Mercer Clotilda SAUNDERS, MD  sulfamethoxazole -trimethoprim  (BACTRIM  DS) 800-160 MG tablet Take 2 tablets by mouth 2 (two) times daily. Patient taking differently: Take 2 tablets by mouth 2 (  two) times daily. Start 12/09/23 End 12/10/23 12/09/23  Yes Fleeta Kathie Jomarie LOISE, MD  atorvastatin  (LIPITOR) 10 MG tablet Take 1 tablet (10 mg total) by mouth daily. Patient not taking: Reported on 12/16/2023 08/14/22   Mercer Clotilda SAUNDERS, MD  leptospermum manuka honey (MEDIHONEY) PSTE paste Apply 1 Application topically daily. Interchangeable with TheraHoney Apply thin layer (3 mm) to  wound. Patient not taking: Reported on 12/16/2023 08/15/23   Rai, Nydia POUR, MD  lidocaine  (LIDODERM ) 5 % Place 1 patch onto the skin daily. Remove & Discard patch within 12 hours or as directed by MD Patient not taking: Reported on 12/16/2023 11/09/23   Debby Fidela CROME, NP  potassium chloride  SA (KLOR-CON  M) 20 MEQ tablet Take 2 tablets (40 mEq total) by mouth 2 (two) times daily. Patient not taking: Reported on 12/16/2023 11/22/23   Darci Pore, MD     Critical care time: 15 minutes

## 2023-12-21 NOTE — Progress Notes (Signed)
 Critical care attending attestation note:   Patient seen and examined and relevant ancillary tests reviewed.  I agree with the assessment and plan of care as outlined by resident Dr Janna.    Summary of Assessment and Plan   71 F w pmh of of ILD on 3L O2, restrictive lung disease, DHF, RA who had MRSA septic arthritis which was treated with daptomycin  since 6/29. P/w sob. Diagnosed with suspected daptomycin  induced pneumonitis/AEP. Currently on HFNC.  Now down to 40% HFNC. Tenous respiratory status w drop on minimal ambulation.   Pertinent Physical Exam:  Lung clear. No rales seen.  HR RR.   Labs and Radiology reviewed.  A/P:  Neuro: NAI.  CVS: NAI.   Respiratory:  acute on chronic hypoxic resp failure: 3L baseline. Now on HFNC. 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 Likely AEP. Discussed the care with primary pulmonologist Dr Lorry. Suggested giving high dose steroids. Starting 125mg  solumedrol q6h x 48 hrs and then taper to 80mg  prednisone . Tapering by 10-20 mg every 2 weeks. Bactrim  ppx and PPI.  Suspected aspiration PNA: on abx.   Abdomen/Liver: currently unstable for MBS. Will get it when resp status better.    Renal: NAI.   Skin/Extremities: Septic arthritis. S/p daptomycin . Now on vancomycin . End date of 8/10.   RA. On prednisone  at home. Pred now as above.   Ppx: lovenox  and Ppi.  Lines: PICC 7/31.  Code status: Full.     CRITICAL CARE Performed by: Sammi JONETTA Fredericks.     Total critical care time: 40 minutes   Critical care time was exclusive of separately billable procedures and treating other patients.   Critical care was necessary to treat or prevent imminent or life-threatening deterioration.   Critical care was time spent personally by me on the following activities: development of treatment plan with patient and/or surrogate as well as nursing, discussions with consultants, evaluation of patient's response to treatment, examination of  patient, obtaining history from patient or surrogate, ordering and performing treatments and interventions, ordering and review of laboratory studies, ordering and review of radiographic studies, pulse oximetry, re-evaluation of patient's condition and participation in multidisciplinary rounds.  Sammi JONETTA Fredericks, MD Pulmonary, Critical Care and Sleep Attending.  Pager: [phone]  12/21/2023, 1:04 PM

## 2023-12-22 ENCOUNTER — Inpatient Hospital Stay (HOSPITAL_COMMUNITY)

## 2023-12-22 DIAGNOSIS — M00019 Staphylococcal arthritis, unspecified shoulder: Secondary | ICD-10-CM

## 2023-12-22 DIAGNOSIS — T50905A Adverse effect of unspecified drugs, medicaments and biological substances, initial encounter: Secondary | ICD-10-CM

## 2023-12-22 LAB — CBC WITH DIFFERENTIAL/PLATELET
Abs Immature Granulocytes: 0.15 K/uL — ABNORMAL HIGH (ref 0.00–0.07)
Basophils Absolute: 0 K/uL (ref 0.0–0.1)
Basophils Relative: 0 %
Eosinophils Absolute: 0 K/uL (ref 0.0–0.5)
Eosinophils Relative: 0 %
HCT: 30.8 % — ABNORMAL LOW (ref 36.0–46.0)
Hemoglobin: 9.4 g/dL — ABNORMAL LOW (ref 12.0–15.0)
Immature Granulocytes: 2 %
Lymphocytes Relative: 23 %
Lymphs Abs: 1.4 K/uL (ref 0.7–4.0)
MCH: 28.4 pg (ref 26.0–34.0)
MCHC: 30.5 g/dL (ref 30.0–36.0)
MCV: 93.1 fL (ref 80.0–100.0)
Monocytes Absolute: 0.3 K/uL (ref 0.1–1.0)
Monocytes Relative: 4 %
Neutro Abs: 4.5 K/uL (ref 1.7–7.7)
Neutrophils Relative %: 71 %
Platelets: 313 K/uL (ref 150–400)
RBC: 3.31 MIL/uL — ABNORMAL LOW (ref 3.87–5.11)
RDW: 15.9 % — ABNORMAL HIGH (ref 11.5–15.5)
WBC: 6.3 K/uL (ref 4.0–10.5)
nRBC: 0 % (ref 0.0–0.2)

## 2023-12-22 LAB — COMPREHENSIVE METABOLIC PANEL WITH GFR
ALT: 19 U/L (ref 0–44)
AST: 15 U/L (ref 15–41)
Albumin: 2 g/dL — ABNORMAL LOW (ref 3.5–5.0)
Alkaline Phosphatase: 108 U/L (ref 38–126)
Anion gap: 9 (ref 5–15)
BUN: 16 mg/dL (ref 8–23)
CO2: 24 mmol/L (ref 22–32)
Calcium: 8.2 mg/dL — ABNORMAL LOW (ref 8.9–10.3)
Chloride: 100 mmol/L (ref 98–111)
Creatinine, Ser: 0.71 mg/dL (ref 0.44–1.00)
GFR, Estimated: >60 mL/min (ref >60)
Glucose, Bld: 161 mg/dL — ABNORMAL HIGH (ref 70–99)
Potassium: 4.7 mmol/L (ref 3.5–5.1)
Sodium: 133 mmol/L — ABNORMAL LOW (ref 135–145)
Total Bilirubin: 0.4 mg/dL (ref 0.0–1.2)
Total Protein: 5.7 g/dL — ABNORMAL LOW (ref 6.5–8.1)

## 2023-12-22 LAB — GLUCOSE, CAPILLARY
Glucose-Capillary: 162 mg/dL — ABNORMAL HIGH (ref 70–99)
Glucose-Capillary: 163 mg/dL — ABNORMAL HIGH (ref 70–99)
Glucose-Capillary: 173 mg/dL — ABNORMAL HIGH (ref 70–99)

## 2023-12-22 MED ORDER — LOSARTAN POTASSIUM 50 MG PO TABS: 25.0000 mg | ORAL_TABLET | Freq: Every day | ORAL | Status: DC

## 2023-12-22 MED ORDER — AMLODIPINE BESYLATE 5 MG PO TABS
5.0000 mg | ORAL_TABLET | Freq: Every day | ORAL | Status: DC
  Administered 2023-12-22 – 2023-12-23 (×2): 5 mg via ORAL
  Filled 2023-12-22 (×2): qty 1

## 2023-12-22 MED ORDER — ORAL CARE MOUTH RINSE: 15.0000 mL | OROMUCOSAL | Status: DC | PRN

## 2023-12-22 MED ORDER — ORAL CARE MOUTH RINSE
15.0000 mL | OROMUCOSAL | Status: DC
  Administered 2023-12-22 (×2): 15 mL via OROMUCOSAL

## 2023-12-22 NOTE — Progress Notes (Signed)
 NAME:  [name], MRN:  [id number], DOB:  1940/06/07, LOS: 7 ADMISSION DATE:  12/15/2023, CONSULTATION DATE:  12/17/23 REFERRING MD:  Dr. Franky, CHIEF COMPLAINT:  Acute Hypoxic Respiratory Failure   History of Present Illness:  A 83 y.o. female with ILD (?chronic aspiration), chronic hypoxic resp failure on HOT @ 3 L [place] for 20 yrs, restrictive lung disease (mild), septic left shoulder, chronic diastolic heart failure (June 2025, Echo EF 60%, G1DD), ?PE (past hx), HTN, dyslipidemia, hypothyroidism, rheumatoid arthritis, debility, chronic sinus tachycardia, chronic chest pain, and DM-2, who was admitted on 12/15/2023 with progressive SOB and dry cough for 3 days before admission. She was dx with sepsis and community-acquired pneumonia, after presenting from SNF rehab.  She underwent hemiarthroplasty of the left shoulder on 11/15/2023, complicated with septic arthritis, positive for MRSA. Infectious disease was consulted, and the patient was started on daptomycin  via PICC line 11/15/2023, with course of daptomycin  to conclude with final dose to occur on 12/27/2023.   She has no f/c/r, generalized myalgias, or LL edema.   Never smoked. No alcohol  or illicit drug use.  Hospitalist called PCCM team because of wheezing, hypoxia, and being placed on BiPAP 14/8/60%, back up rate 12. IVF was d/c and given IV Lasix  40 mgx1.  Pertinent  Medical History  ILD (?chronic aspiration), chronic hypoxic resp failure on HOT @ 3 L [place] for 20 yrs, restrictive lung disease (mild), septic left shoulder, chronic diastolic heart failure (June 2025, Echo EF 60%, G1DD), ?PE (past hx), HTN, dyslipidemia, hypothyroidism, rheumatoid arthritis, debility, chronic sinus tachycardia, chronic chest pain, DM-2, sigmoid colon stricture s/p colectomy and colostomy 06/2023   Significant Hospital Events: Including procedures, antibiotic start and stop dates in addition to other pertinent events   7/29: admitted for shortness of  breath 7/31: consulted for worsening respiratory status 8/1: remains on HHFNC 8/2 no acute issues overnight remains on 35 L HHFNC 8/3 remains on heated high flow nasal cannula FiO2 down to 59 but flow is back up to 40 L 8/4 remains on HHFNC, FiO2 down to 40; started on high dose steroids and Bactrim  ppx 8/5 on HFNC; continue high dose steroids, good respiratory improvement, likely transfer out tomorrow  Interim History / Subjective:  Respiratory status seems improved today compared to yesterday. NAEON.  Objective    Blood pressure (!) 161/90, pulse 66, temperature 98.1 F (36.7 C), temperature source Oral, resp. rate 18, height 5' 4 (1.626 m), weight 80.5 kg, SpO2 (!) 89%.    FiO2 (%):  [35 %-60 %] 40 %   Intake/Output Summary (Last 24 hours) at 12/22/2023 0748 Last data filed at 12/21/2023 2200 Gross per 24 hour  Intake 295.43 ml  Output 950 ml  Net -654.57 ml   Filed Weights   12/18/23 0425 12/19/23 0500 12/21/23 0554  Weight: 81.5 kg 82.1 kg 80.5 kg   Examination: General: Awake and Alert in NAD HEENT: NCAT. Sclera anicteric. No rhinorrhea. Cardiovascular: RRR. No M/R/G Respiratory: CTAB, normal WOB on HHFNC. No wheezing, crackles, rhonchi, or diminished breath sounds. Abdomen: Soft, non-tender, non-distended. Bowel sounds normoactive Extremities: Able to move all extremities. Mild L shoulder pain. No BLE edema, no deformities or significant joint findings. Skin: Warm and dry. Neuro: A&Ox3. No focal neurological deficits.  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 Risk with attempting bronchoscopy is that patient may not be able to come off the vent with a history of  chronic respiratory failure, interstitial lung disease at baseline which may now be exacerbated by an eosinophilic pneumonitis. Received 3 days total of atypical coverage and completed pneumonia coverage with  Zosyn .  Viral panel negative; pending Legionella and Strep pneumo urine antigens P: Continuing vancomycin  for septic left shoulder. Respiratory status has improved since increasing the steroids and is slowly weaning on HF. Goal is to get her back to her baseline of 3L.  - Continue methylprednisolone  125 mg q6h for 8 doses, will wean slowly - Continue HFNC, wean as able - Remains too high risk for bronc - Encourage incentive spirometry - Mobilize as able - PT/OT eval and treat - Aspiration precautions   Septic left shoulder MRSA Daptomycin  (PTA-7/31). Transitioned to Vanc (7/31-8/10) and Zosyn  on (7/31 - 8/4). P: - Continue vancomycin  as above  History of diastolic dysfunction P: - Resume home diuretics at reduced dose of Lasix  20 mg daily - Continuous telemetry - Optimize electrolytes as needed  Rheumatoid arthritis and interstitial lung disease Was chronically on prednisone  10 mg daily at home. P: - Continue Solumedrol 125 mg q6h x 48h and taper to Prednisone  80 mg - Plan to taper 10-20 mg every 2 weeks - Bactrim  ppx 800-160 mg MWF and PPI BID - Continue antibiotics to cover for aspiration PNA   Hypothyroidism P: - Continue home levothyroxine  50 mcg daily   Hypertension BP elevated to 140-170s/90-100s. P: - Remains on home clonidine  0.1 mg TID and metoprolol  100 mg BID - Started Amlodipine  5 mg daily  - Contraindication to ACEi/ARBs with hx of swelling   T2DM CBGs stable. Monitor sugars closely with increased steroid dose. P: - Continue sensitive scale SSI - CBG checks ACHS - CBG goal 140-180  Best Practice (right click and Reselect all SmartList Selections daily)   Diet/type: Regular consistency (see orders) and NPO w/ oral meds DVT prophylaxis other Pressure ulcer(s): present on admission  GI prophylaxis: PPI Lines: yes and it is still needed Foley:  N/A and removal ordered  Code Status:  full code Last date of multidisciplinary goals of care  discussion: Continue to update family daily  Labs   CBC: Recent Labs  Lab 12/15/23 1240 12/16/23 0002 12/16/23 1655 12/17/23 0103 12/20/23 0410  WBC 16.3* 17.2* 16.7* 17.4* 6.2  NEUTROABS 13.5* 13.6*  --   --   --   HGB 9.8* 9.5* 9.1* 10.0* 8.3*  HCT 32.3* 30.3* 29.3* 32.1* 27.0*  MCV 94.7 91.0 92.7 92.5 92.5  PLT 226 243 241 275 271    Basic Metabolic Panel: Recent Labs  Lab 12/15/23 1430 12/16/23 0002 12/17/23 0103 12/18/23 0412 12/19/23 0343 12/20/23 0410 12/21/23 1114  NA  --  138 140 139 138 136 134*  K  --  3.6 3.6 3.3* 3.5 4.3 3.2*  CL  --  102 104 103 102 101 100  CO2  --  26 25 23 23 23 24   GLUCOSE  --  92 99 138* 102* 87 106*  BUN  --  25* 18 23 30* 28* 18  CREATININE  --  1.00 0.86 1.18* 1.30* 0.97 0.79  CALCIUM   --  8.2* 8.1* 8.3* 8.1* 8.1* 7.9*  MG 1.9 1.9  --   --  1.8 2.0  --   PHOS  --  3.1  --   --   --   --   --    GFR: Estimated Creatinine Clearance: 55.6 mL/min (by C-G formula based on SCr of 0.79 mg/dL). Recent Labs  Lab  12/16/23 0002 12/16/23 1655 12/17/23 0103 12/17/23 0909 12/20/23 0410  PROCALCITON 0.42  --   --  0.48  --   WBC 17.2* 16.7* 17.4*  --  6.2    Liver Function Tests: Recent Labs  Lab 12/15/23 1240 12/16/23 0002 12/17/23 0103  AST 25 21 34  ALT 27 26 25   ALKPHOS 181* 152* 177*  BILITOT 1.0 0.7 0.9  PROT 5.9* 5.1* 5.8*  ALBUMIN  2.0* 2.0* 1.9*   No results for input(s): LIPASE, AMYLASE in the last 168 hours. No results for input(s): AMMONIA in the last 168 hours.  ABG    Component Value Date/Time   PHART 7.460 (H) 03/30/2021 1638   PCO2ART 46.6 03/30/2021 1638   PO2ART 70 (L) 03/30/2021 1638   HCO3 27.8 12/17/2023 0118   TCO2 24 07/15/2023 1215   O2SAT 52.5 12/17/2023 0118     Coagulation Profile: No results for input(s): INR, PROTIME in the last 168 hours.  Cardiac Enzymes: Recent Labs  Lab 12/16/23 0002  CKTOTAL 184    HbA1C: Hemoglobin A1C  Date/Time Value Ref Range Status   10/15/2023 02:48 PM 6.0 (A) 4.0 - 5.6 % Final   Hgb A1c MFr Bld  Date/Time Value Ref Range Status  12/18/2023 04:12 AM 5.5 4.8 - 5.6 % Final    Comment:    (NOTE)         Prediabetes: 5.7 - 6.4         Diabetes: >6.4         Glycemic control for adults with diabetes: <7.0   06/14/2023 03:16 AM 5.6 4.8 - 5.6 % Final    Comment:    (NOTE) Pre diabetes:          5.7%-6.4%  Diabetes:              >6.4%  Glycemic control for   <7.0% adults with diabetes     CBG: Recent Labs  Lab 12/21/23 1100 12/21/23 1516 12/21/23 1918 12/21/23 2304 12/22/23 0307  GLUCAP 101* 174* 261* 148* 162*    Review of Systems:   As above  Past Medical History:  She,  has a past medical history of Acute encephalopathy (03/30/2021), Acute kidney injury superimposed on chronic kidney disease (HCC) (04/17/2015), Acute on chronic diastolic (congestive) heart failure (HCC), Anemia, Anxiety, Arthritis, Asthma, Carotid artery occlusion, Chest pain of uncertain etiology (01/28/2012), Chronic fatigue, Chronic kidney disease, Closed intertrochanteric fracture of hip, left, initial encounter (HCC) (11/29/2016), Closed left hip fracture (HCC), Closed nondisplaced intertrochanteric fracture of left femur with delayed healing (01/14/2017), Clotting disorder (HCC), Contusion of left knee, COPD (chronic obstructive pulmonary disease) (HCC), Coronary artery disease, Depression, reactive, Diabetes mellitus, Diastolic dysfunction, Difficulty in walking, Family history of heart disease, Flu (06/26/2023), Generalized muscle weakness, Gout, Hardware complicating wound infection (HCC) (12/08/2023), High cholesterol, History of falling, Hypertension, Hypothyroidism, Interstitial lung disease (HCC), 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   Surgical History:   Past Surgical History:  Procedure Laterality Date   BRAIN SURGERY     Gamma knife 10/13. Needs repeat spring  '14   CARDIAC CATHETERIZATION N/A 10/31/2014   Procedure: Right/Left Heart Cath and Coronary Angiography;  Surgeon: Candyce GORMAN Reek, MD;  Location: [facility] INVASIVE CV LAB;  Service: Cardiovascular;  Laterality: N/A;   COLONOSCOPY WITH PROPOFOL  N/A 04/14/2022   Procedure: COLONOSCOPY WITH PROPOFOL ;  Surgeon: Albertus Gordy HERO, MD;  Location: [facility] ENDOSCOPY;  Service: Gastroenterology;  Laterality: N/A;   CYST EXCISION     2 on face   CYST REMOVAL NECK     ESOPHAGOGASTRODUODENOSCOPY (EGD) WITH PROPOFOL  N/A 09/14/2014   Procedure: ESOPHAGOGASTRODUODENOSCOPY (EGD) WITH PROPOFOL ;  Surgeon: Lamar JONETTA Aho, MD;  Location: [facility] ENDOSCOPY;  Service: Endoscopy;  Laterality: N/A;   INCISION AND DRAINAGE HIP Left 01/16/2017   Procedure: IRRIGATION AND DEBRIDEMENT LEFT HIP;  Surgeon: Vernetta Lonni GRADE, MD;  Location: [facility] ORS;  Service: Orthopedics;  Laterality: Left;   INTRAMEDULLARY (IM) NAIL INTERTROCHANTERIC Left 11/29/2016   Procedure: INTRAMEDULLARY (IM) NAIL INTERTROCHANTRIC;  Surgeon: Vernetta Lonni GRADE, MD;  Location: [facility] OR;  Service: Orthopedics;  Laterality: Left;   LAPAROSCOPIC LOOP COLOSTOMY N/A 07/17/2023   Procedure: LAPAROSCOPIC DIVERTING LOOP COLOSTOMY;  Surgeon: Belinda Cough, MD;  Location: [facility] OR;  Service: General;  Laterality: N/A;   OVARY SURGERY     RIGHT HEART CATH N/A 08/12/2021   Procedure: RIGHT HEART CATH;  Surgeon: Court Dorn PARAS, MD;  Location: [facility] INVASIVE CV LAB;  Service: Cardiovascular;  Laterality: N/A;   SHOULDER HEMI-ARTHROPLASTY Left 11/15/2023   Procedure: HEMIARTHROPLASTY, SHOULDER;  Surgeon: Cristy Bonner DASEN, MD;  Location: [facility] OR;  Service: Orthopedics;  Laterality:  Left;  with Block   SHOULDER SURGERY Left    TONSILLECTOMY  age 7   VAGINAL HYSTERECTOMY     VIDEO BRONCHOSCOPY Bilateral 05/31/2013   Procedure: VIDEO BRONCHOSCOPY WITHOUT FLUORO;  Surgeon: Dorethia Cave, MD;  Location: [facility] ENDOSCOPY;  Service: Cardiopulmonary;  Laterality: Bilateral;   video bronscoscopy  05/19/1998   lung     Social History:   reports that she has never smoked. She has never used smokeless tobacco. She reports that she does not drink alcohol  and does not use drugs.   Family History:  Her family history includes Breast cancer in her sister and sister; Diabetes in her brother, mother, and sister; Esophageal cancer in her brother; Fibroids in her daughter; Gout in her brother; Heart attack in her brother and mother; Heart disease in her brother; Heart murmur in her son; Hypertension in her brother, daughter, daughter, father, and son; Kidney cancer in her brother; Kidney failure in her brother; Lung cancer in her father; Prostate cancer in her brother; Rheum arthritis in her maternal uncle; Stomach cancer in her brother; Uterine cancer in her daughter. There is no history of Alzheimer's disease or Dementia.   Allergies Allergies  Allergen Reactions   Codeine Swelling and Other (See Comments)    Facial and leg swelling Chest pain    Remicade [Infliximab] Anaphylaxis    sent me into shock    Zestril [Lisinopril] Swelling and Rash    Face and neck swelling    Ofev  [Nintedanib] Diarrhea     Home Medications  Prior to Admission medications   Medication Sig Start Date End Date Taking? Authorizing Provider  acetaminophen  (TYLENOL ) 500 MG tablet Take 1,000 mg by mouth every 8 (eight) hours as needed (pain).   Yes [provider]  albuterol  (VENTOLIN  HFA) 108 (90 Base) MCG/ACT inhaler Inhale 2 puffs into the lungs in the morning and at bedtime.   Yes [provider]  bisacodyl  (DULCOLAX) 10 MG suppository Place 10 mg rectally daily as needed (bowel  management).   Yes [provider]  Cholecalciferol  (VITAMIN D3) 50 MCG (2000 UT) TABS Take 2,000 Units  by mouth daily.   Yes [provider]  daptomycin  (CUBICIN ) IVPB Inject 700 mg into the vein daily. Indication:  MRSA Bacteremia/septic arthritis of shoulder First Dose: Yes Last Day of Therapy:  12/27/23 Labs - Once weekly:  CBC/D, BMP, and CPK Labs - Once weekly: ESR and CRP Patient taking differently: Inject 700 mg into the vein daily. Indication:  MRSA Bacteremia/septic arthritis of shoulder First Dose: Yes Last Day of Therapy:  12/27/23 Labs - Once weekly:  CBC/D, BMP, and CPK Labs - Once weekly: ESR and CRP Start date 11/23/23 11/22/23 12/31/23 Yes Darci Pore, MD  diclofenac  Sodium (VOLTAREN ) 1 % GEL Apply 2 g topically 3 (three) times daily. 08/04/23  Yes Angiulli, Toribio PARAS, PA-C  DULoxetine  (CYMBALTA ) 60 MG capsule Take 1 capsule (60 mg total) by mouth daily. 08/05/23  Yes Angiulli, Toribio PARAS, PA-C  feeding supplement (ENSURE PLUS HIGH PROTEIN) LIQD Take 237 mLs by mouth 2 (two) times daily between meals. 11/22/23  Yes Darci Pore, MD  furosemide  (LASIX ) 40 MG tablet Take 1 tablet (40 mg total) by mouth daily. 08/05/23  Yes Angiulli, Toribio PARAS, PA-C  gabapentin  (NEURONTIN ) 300 MG capsule Take 1 capsule (300 mg total) by mouth 2 (two) times daily. 08/04/23  Yes Angiulli, Toribio PARAS, PA-C  guaiFENesin  (MUCINEX ) 600 MG 12 hr tablet Take 2 tablets (1,200 mg total) by mouth 2 (two) times daily. 08/04/23  Yes Angiulli, Toribio PARAS, PA-C  heparin  lock flush 100 UNIT/ML SOLN injection Inject 5 mLs into the vein daily. 11/23/23 12/27/23 Yes [provider]  hydrocortisone  (ANUSOL -HC) 2.5 % rectal cream Place 1 Application rectally 2 (two) times daily.   Yes [provider]  hyoscyamine  (LEVSIN  SL) 0.125 MG SL tablet Place 1 tablet (0.125 mg total) under the tongue every 8 (eight) hours as needed. Patient taking differently: Place 0.125 mg under the tongue  every 8 (eight) hours as needed for cramping. 11/04/23  Yes Zehr, Jessica D, PA-C  levothyroxine  (SYNTHROID ) 50 MCG tablet Take 1 tablet (50 mcg total) by mouth daily. 11/28/22  Yes Mercer Clotilda SAUNDERS, MD  methocarbamol  (ROBAXIN ) 500 MG tablet Take 1 tablet (500 mg total) by mouth every 6 (six) hours as needed for muscle spasms (pain). 08/04/23  Yes Angiulli, Toribio PARAS, PA-C  metoprolol  tartrate (LOPRESSOR ) 25 MG tablet Take 2 tablets (50 mg total) by mouth 2 (two) times daily. 11/22/23  Yes Darci Pore, MD  morphine  (MS CONTIN ) 15 MG 12 hr tablet Take 1 tablet (15 mg total) by mouth every 12 (twelve) hours. 11/22/23  Yes Darci Pore, MD  Multiple Vitamin (MULTIVITAMIN) tablet Take 1 tablet by mouth daily.   Yes [provider]  nitroGLYCERIN  (NITROSTAT ) 0.4 MG SL tablet Place 1 tablet (0.4 mg total) under the tongue every 5 (five) minutes as needed for chest pain. 08/04/23  Yes Angiulli, Toribio PARAS, PA-C  oxyCODONE  (OXY IR/ROXICODONE ) 5 MG immediate release tablet Take 1 pills every 6 hrs as needed for severe breakthrough pain Patient taking differently: Take 5 mg by mouth every 6 (six) hours as needed for moderate pain (pain score 4-6) or severe pain (pain score 7-10). 11/17/23  Yes McBane, Aleck SAILOR, PA-C  OXYGEN  Inhale 3 L/min into the lungs at bedtime. As needed during day time   Yes [provider]  pantoprazole  (PROTONIX ) 40 MG tablet Take 1 tablet (40 mg total) by mouth 2 (two) times daily. 06/23/23  Yes Mercer Clotilda SAUNDERS, MD  predniSONE  (DELTASONE ) 10 MG tablet Take 1 tablet (10  mg total) by mouth daily with breakfast. 10/15/23  Yes Mercer Clotilda SAUNDERS, MD  sulfamethoxazole -trimethoprim  (BACTRIM  DS) 800-160 MG tablet Take 2 tablets by mouth 2 (two) times daily. Patient taking differently: Take 2 tablets by mouth 2 (two) times daily. Start 12/09/23 End 12/10/23 12/09/23  Yes Fleeta Kathie Jomarie LOISE, MD  atorvastatin  (LIPITOR) 10 MG tablet Take 1 tablet (10 mg total) by mouth  daily. Patient not taking: Reported on 12/16/2023 08/14/22   Mercer Clotilda SAUNDERS, MD  leptospermum manuka honey (MEDIHONEY) PSTE paste Apply 1 Application topically daily. Interchangeable with TheraHoney Apply thin layer (3 mm) to wound. Patient not taking: Reported on 12/16/2023 08/15/23   Rai, Nydia POUR, MD  lidocaine  (LIDODERM ) 5 % Place 1 patch onto the skin daily. Remove & Discard patch within 12 hours or as directed by MD Patient not taking: Reported on 12/16/2023 11/09/23   Debby Fidela CROME, NP  potassium chloride  SA (KLOR-CON  M) 20 MEQ tablet Take 2 tablets (40 mEq total) by mouth 2 (two) times daily. Patient not taking: Reported on 12/16/2023 11/22/23   Darci Pore, MD     Critical care time: 30 minutes

## 2023-12-22 NOTE — Progress Notes (Signed)
 Modified Barium Swallow Study  Patient Details  Name: [name] MRN: [id number] Date of Birth: [birth year]  Today's Date: 12/22/2023  Modified Barium Swallow completed.  Full report located under Chart Review in the Imaging Section.  History of Present Illness 83 y.o. female with ILD (?chronic aspiration), chronic hypoxic resp failure on HOT @ 3 L  for 20 yrs, restrictive lung disease (mild), septic left shoulder, chronic diastolic heart failure, COPD, ?PE (past hx), HTN, dyslipidemia, hypothyroidism, rheumatoid arthritis, debility, chronic sinus tachycardia, chronic chest pain, and DM-2, who was admitted on 12/15/2023 with progressive SOB and dry cough for 3 days before admission. She was dx with sepsis and community-acquired pneumonia, after presenting from SNF rehab. [name], NP states she suspects pna may be from medication she was taking versus aspiration. BSE 06/20/2023 without overt s/s aspiration, recommended Dys 3/thin.   Clinical Impression Pt exhibits a suspected chronic mild pharyngeal dysphagia due to anatomical difference. Pt appears to have substantial osteophytes along majority of cervical spine. This results in only partial epiglottic deflection resulting in laryngeal penetration into laryngeal vestibule with nectar and puree that was ejected during the swallow (PAS 2). There was penetration to level of vocal cords with thin after the swallow (PAS 5). A chin tuck was inconsistent to prevent penetration and a cue for hard cough was consistently effective to remove penetrates. Vallecular and pyriform sinsus residue present and increased with puree and solid. A verbal cue for volitional swallow minimally reduced residue. Barium pill stopped at pyriform sinuses and was transited with thin liquid. No esophageal residue when scanned. Recommend continue regular texture, thin liquids with chin tuck, hard cough after every other sip, 2 swallows intermittently and pills with applesauce whole  however crush if large. Factors that may increase risk of adverse event in presence of aspiration [name] 2021): Respiratory or GI disease    Swallow Evaluation Recommendations Recommendations: PO diet PO Diet Recommendation: Regular;Thin liquids (Level 0) Liquid Administration via: Cup;Straw Medication Administration: Whole meds with puree (crush large) Supervision: Patient able to self-feed;Full supervision/cueing for swallowing strategies Swallowing strategies  : Slow rate;Small bites/sips;Multiple dry swallows after each bite/sip;Hard cough after swallowing;Chin tuck (chin tuck with thin) Postural changes: Position pt fully upright for meals Oral care recommendations: Oral care BID (2x/day)      [name] 12/22/2023,2:36 PM

## 2023-12-22 NOTE — Evaluation (Signed)
 Occupational Therapy Evaluation Patient Details Name: [name] MRN: [id number] DOB: 06/29/1940 Today's Date: 12/22/2023   History of Present Illness   Pt is an 77 y.p. female presenting 7/29 from rehab facility with shortness of breath. Admitted for severe sepsis due to suspected CAP; AKI. 7/31; pt with worsening respiratory status; placed on HHFNC. PMH: DM, HTN, CAD, RA,  s/p  colectomy with colostomy creation, CHF, COPD, chronic hypoxic respiratory failure on 3L home O2, L shoulder hemiarthroplasty 11/15/2023 in which she subsequently developed septic arthritis; last dose of daptomycin  planned for 8/10.     Clinical Impressions PTA, pt recently at [facility] where she was receiving IV antibiotics for shoulder infection. Upon eval, pt presents with decreased activity tolerance, cardiopulmonary endurance, safety, strength, and functional independence. Pt very pleasant and enjoys performing self care tasks. Pt needing grossly set-up-max A for BADL at time of eval. Pt able to perform bed mobility with up to mod A this session. Educated regarding activity pacing in light of new O2 requirements. Will continue to follow. Patient will benefit from continued inpatient follow up therapy, <3 hours/day      If plan is discharge home, recommend the following:   Two people to help with walking and/or transfers;A lot of help with bathing/dressing/bathroom;Assistance with cooking/housework;Assist for transportation;Help with stairs or ramp for entrance     Functional Status Assessment   Patient has had a recent decline in their functional status and demonstrates the ability to make significant improvements in function in a reasonable and predictable amount of time.     Equipment Recommendations   Other (comment) (defer)     Recommendations for Other Services         Precautions/Restrictions   Precautions Precautions: Fall;Other (comment);Shoulder (watch SpO2) Type of Shoulder  Precautions: pt nor family able to speficy whether pt is NWB or if AROM is allowed. pt attempts to move shoulder naturally. May need to seek clarification regaridng ROM restrictions and WB restrictions. per son, 2 weeks after surgery was supposed to begin ROM, but that never started due to pt becoming septic Restrictions Other Position/Activity Restrictions: unclear whether WB precautions for LUE     Mobility Bed Mobility Overal bed mobility: Needs Assistance Bed Mobility: Rolling, Sidelying to Sit, Sit to Supine Rolling: Min assist Sidelying to sit: Mod assist   Sit to supine: Mod assist, +2 for physical assistance, +2 for safety/equipment   General bed mobility comments: to roll fully onto side and then elevate trunk    Transfers                   General transfer comment: deferred      Balance Overall balance assessment: Needs assistance Sitting-balance support: No upper extremity supported, Feet supported Sitting balance-Leahy Scale: Good Sitting balance - Comments: supervision statically                                   ADL either performed or assessed with clinical judgement   ADL Overall ADL's : Needs assistance/impaired Eating/Feeding: Set up;Sitting;Supervision/ safety   Grooming: Supervision/safety;Sitting   Upper Body Bathing: Sitting;Minimal assistance   Lower Body Bathing: Maximal assistance;Sitting/lateral leans   Upper Body Dressing : Sitting;Moderate assistance   Lower Body Dressing: Maximal assistance;Sitting/lateral leans                       Vision Patient Visual Report: No change from  baseline Additional Comments: denies changes; 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 pt able to read     Perception         Praxis         Pertinent Vitals/Pain Pain Assessment Pain Assessment: Faces Faces Pain Scale: Hurts little more Pain Location: L shoulder Pain Descriptors / Indicators: Sore Pain Intervention(s): Limited  activity within patient's tolerance, Monitored during session     Extremity/Trunk Assessment Upper Extremity Assessment Upper Extremity Assessment: Generalized weakness;RUE deficits/detail;LUE deficits/detail RUE Deficits / Details: pt reports hx of shoulder issues, uses functionall below level of shoulder LUE Deficits / Details: recent shoulder surgery   Lower Extremity Assessment Lower Extremity Assessment: Defer to PT evaluation   Cervical / Trunk Assessment Cervical / Trunk Assessment:  (corvature to spine resembling scoliosis)   Communication Communication Communication: No apparent difficulties   Cognition Arousal: Alert Behavior During Therapy: WFL for tasks assessed/performed Cognition: Cognition impaired     Awareness: Intellectual awareness intact, Online awareness impaired Memory impairment (select all impairments): Short-term memory Attention impairment (select first level of impairment): Sustained attention (distracted by external stim at times) Executive functioning impairment (select all impairments): Problem solving, Reasoning OT - Cognition Comments: not formally assessed, some difficulty providing a Most recent history                 Following commands: Intact (for one step commands)       Cueing  General Comments   Cueing Techniques: Verbal cues;Gestural cues;Tactile cues  VSS on 10 L HFNC   Exercises     Shoulder Instructions      Home Living Family/patient expects to be discharged to:: Private residence (pt/son did not state whether plan would be for rehab vs home; per son, only at rehab due to need for abx) Living Arrangements: Spouse/significant other;Children Available Help at Discharge: Family;Available 24 hours/day Type of Home: House Home Access: Stairs to enter;Level entry Entrance Stairs-Number of Steps: 1 Entrance Stairs-Rails: None Home Layout: Two level;Able to live on main level with bedroom/bathroom Alternate Level  Stairs-Number of Steps: flight   Bathroom Shower/Tub: Walk-in shower;Curtain;Other (comment)   Bathroom Toilet: Handicapped height Bathroom Accessibility: Yes How Accessible: Accessible via walker Home Equipment: Rolling Walker (2 wheels);Hospital bed;BSC/3in1;Wheelchair - manual;Shower seat;Grab bars - tub/shower;Hand held shower head;Grab bars - toilet (air mattress, lift chair)   Additional Comments: aide 5 days/week helping with morning routine, breakfast, medication; aide changes ostomy; sometimes comes back in in evening just to see if any needs      Prior Functioning/Environment Prior Level of Function : Needs assist             Mobility Comments: uses RW or cane within the home; most recently in bed most of the time at rehab ADLs Comments: at rehab has been having assist with all ADL; per pt and son, at home, aide 5 days/week helping with morning routine, breakfast, medication; aide changes ostomy; sometimes comes back in in evening just to see if any needs. pt spends most time in bedroom; lift chair; on 3L majority of time at home    OT Problem List: Decreased strength;Decreased activity tolerance;Impaired balance (sitting and/or standing);Decreased safety awareness;Decreased knowledge of use of DME or AE;Cardiopulmonary status limiting activity   OT Treatment/Interventions: Self-care/ADL training;Therapeutic exercise;DME and/or AE instruction;Therapeutic activities;Patient/family education;Balance training      OT Goals(Current goals can be found in the care plan section)   Acute Rehab OT Goals Patient Stated Goal: sit EOB OT Goal Formulation: With patient  Time For Goal Achievement: 01/05/24 Potential to Achieve Goals: Good   OT Frequency:  Min 1X/week    Co-evaluation              AM-PAC OT 6 Clicks Daily Activity     Outcome Measure Help from another person eating meals?: A Little Help from another person taking care of personal grooming?: A  Little Help from another person toileting, which includes using toliet, bedpan, or urinal?: A Lot Help from another person bathing (including washing, rinsing, drying)?: A Lot Help from another person to put on and taking off regular upper body clothing?: A Lot Help from another person to put on and taking off regular lower body clothing?: Total 6 Click Score: 13   End of Session Equipment Utilized During Treatment: Oxygen  Nurse Communication: Mobility status  Activity Tolerance: Patient tolerated treatment well Patient left: in bed;with call bell/phone within reach;with family/visitor present  OT Visit Diagnosis: Unsteadiness on feet (R26.81);Muscle weakness (generalized) (M62.81);Pain                Time: 8354-8272 OT Time Calculation (min): 42 min Charges:  OT General Charges $OT Visit: 1 Visit OT Evaluation $OT Eval Moderate Complexity: 1 Mod  Elma JONETTA Lebron FREDERICK, OTR/L [facility] Acute Rehabilitation Office: [phone]   Elma JONETTA Lebron 12/22/2023, 5:59 PM

## 2023-12-22 NOTE — Evaluation (Signed)
 Physical Therapy Evaluation Patient Details Name: [name] MRN: [id number] DOB: 1941/04/24 Today's Date: 12/22/2023  History of Present Illness  Pt is an 10 y.p. female presenting 7/29 from rehab facility with shortness of breath. Admitted for severe sepsis due to suspected CAP; AKI. 7/31; pt with worsening respiratory status; placed on HHFNC. PMH: DM, HTN, CAD, RA,  s/p  colectomy with colostomy creation, CHF, COPD, chronic hypoxic respiratory failure on 3L home O2, L shoulder hemiarthroplasty 11/15/2023 in which she subsequently developed septic arthritis; last dose of daptomycin  planned for 8/10.  Clinical Impression  Pt admitted with/for SOB with severe sepsis and CAP.  Pt has been struggling  against multiple problems/ailments for a months and has declined to a place where pt is needing mod/max assist for basic mobility.  Have not assessed standing or gait as of this evaluation..  Pt currently limited functionally due to the problems listed. ( See problems list.)   Pt will benefit from PT to maximize function and safety in order to get ready for next venue listed below.  Patient will benefit from continued inpatient follow up therapy, <3 hours/day          If plan is discharge home, recommend the following: A lot of help with walking and/or transfers;A lot of help with bathing/dressing/bathroom;Assistance with cooking/housework;Assist for transportation   Can travel by private vehicle        Equipment Recommendations None recommended by PT  Recommendations for Other Services       Functional Status Assessment Patient has had a recent decline in their functional status and demonstrates the ability to make significant improvements in function in a reasonable and predictable amount of time.     Precautions / Restrictions Precautions Precautions: Fall;Other (comment);Shoulder (watch SpO2) Type of Shoulder Precautions: pt nor family able to speficy whether pt is NWB or if AROM is  allowed. pt attempts to move shoulder naturally. May need to seek clarification regaridng ROM restrictions and WB restrictions. per son, 2 weeks after surgery was supposed to begin ROM, but that never started due to pt becoming septic Restrictions Other Position/Activity Restrictions: unclear whether WB precautions for LUE      Mobility  Bed Mobility Overal bed mobility: Needs Assistance Bed Mobility: Rolling, Sidelying to Sit, Sit to Supine Rolling: Min assist Sidelying to sit: Mod assist   Sit to supine: Mod assist, +2 for physical assistance, +2 for safety/equipment   General bed mobility comments: to roll fully onto side and then elevate trunk.  Assist from R to L, but with assist to decrease the amount of w/bearing assist L UE    Transfers Overall transfer level:  (deferred)                 General transfer comment: deferred    Ambulation/Gait                  Stairs            Wheelchair Mobility     Tilt Bed    Modified Rankin (Stroke Patients Only)       Balance Overall balance assessment: Needs assistance Sitting-balance support: No upper extremity supported, Feet supported Sitting balance-Leahy Scale: Good Sitting balance - Comments: supervision statically                                     Pertinent Vitals/Pain Pain Assessment Pain Assessment: Faces Faces  Pain Scale: Hurts little more Pain Location: L shoulder Pain Descriptors / Indicators: Sore Pain Intervention(s): Limited activity within patient's tolerance, Monitored during session, Patient requesting pain meds-RN notified    Home Living Family/patient expects to be discharged to:: Private residence (pt/son did not state whether plan would be for rehab vs home; per son, only at rehab due to need for abx) Living Arrangements: Spouse/significant other;Children Available Help at Discharge: Family;Available 24 hours/day Type of Home: House Home Access: Stairs to  enter;Level entry Entrance Stairs-Rails: None Entrance Stairs-Number of Steps: 1 Alternate Level Stairs-Number of Steps: flight Home Layout: Two level;Able to live on main level with bedroom/bathroom Home Equipment: Rolling Walker (2 wheels);Hospital bed;BSC/3in1;Wheelchair - manual;Shower seat;Grab bars - tub/shower;Hand held shower head;Grab bars - toilet (air mattress, lift chair) Additional Comments: aide 5 days/week helping with morning routine, breakfast, medication; aide changes ostomy; sometimes comes back in in evening just to see if any needs    Prior Function Prior Level of Function : Needs assist             Mobility Comments: uses RW or cane within the home; most recently in bed most of the time at rehab ADLs Comments: at rehab has been having assist with all ADL; per pt and son, at home, aide 5 days/week helping with morning routine, breakfast, medication; aide changes ostomy; sometimes comes back in in evening just to see if any needs. pt spends most time in bedroom; lift chair; on 3L majority of time at home     Extremity/Trunk Assessment   Upper Extremity Assessment Upper Extremity Assessment: Defer to OT evaluation RUE Deficits / Details: pt reports hx of shoulder issues, uses functionall below level of shoulder LUE Deficits / Details: recent shoulder surgery    Lower Extremity Assessment Lower Extremity Assessment: Generalized weakness    Cervical / Trunk Assessment Cervical / Trunk Assessment: Kyphotic (corvature to spine resembling scoliosis)  Communication   Communication Communication: No apparent difficulties    Cognition Arousal: Alert Behavior During Therapy: WFL for tasks assessed/performed                             Following commands: Intact (for one step commands)       Cueing Cueing Techniques: Verbal cues, Gestural cues, Tactile cues     General Comments General comments (skin integrity, edema, etc.): vss on 10 L HFNC     Exercises Other Exercises Other Exercises: Warm up ROM exercise to bil LE's with graded resistance and bi UE   Assessment/Plan    PT Assessment Patient needs continued PT services  PT Problem List Decreased strength;Decreased activity tolerance;Decreased balance;Decreased mobility;Decreased knowledge of use of DME;Cardiopulmonary status limiting activity;Pain       PT Treatment Interventions DME instruction;Gait training;Functional mobility training;Therapeutic activities;Balance training;Patient/family education    PT Goals (Current goals can be found in the Care Plan section)  Acute Rehab PT Goals Patient Stated Goal: home with assist to be mobile at home PT Goal Formulation: With patient Time For Goal Achievement: 01/05/24 Potential to Achieve Goals: Fair    Frequency Min 2X/week     Co-evaluation               AM-PAC PT 6 Clicks Mobility  Outcome Measure Help needed turning from your back to your side while in a flat bed without using bedrails?: A Lot Help needed moving from lying on your back to sitting on the side of a  flat bed without using bedrails?: A Lot Help needed moving to and from a bed to a chair (including a wheelchair)?: A Lot Help needed standing up from a chair using your arms (e.g., wheelchair or bedside chair)?: A Lot Help needed to walk in hospital room?: A Lot Help needed climbing 3-5 steps with a railing? : A Lot 6 Click Score: 12    End of Session Equipment Utilized During Treatment: Oxygen  Activity Tolerance: Patient tolerated treatment well;Patient limited by pain Patient left: in bed;with bed alarm set;with family/visitor present Nurse Communication: Mobility status PT Visit Diagnosis: Unsteadiness on feet (R26.81);Other abnormalities of gait and mobility (R26.89);Pain Pain - Right/Left: Left Pain - part of body: Arm;Shoulder    Time: 8354-8272 PT Time Calculation (min) (ACUTE ONLY): 42 min   Charges:   PT Evaluation $PT Eval  Moderate Complexity: 1 Mod PT Treatments $Therapeutic Activity: 8-22 mins PT General Charges $$ ACUTE PT VISIT: 1 Visit         12/22/2023  [place] HERO., PT Acute Rehabilitation Services [phone]  (office)  Vinie GAILS Janki Dike 12/22/2023, 7:21 PM

## 2023-12-22 NOTE — Plan of Care (Signed)
  Problem: Education: Goal: Knowledge of General Education information will improve Description: Including pain rating scale, medication(s)/side effects and non-pharmacologic comfort measures Outcome: Progressing   Problem: Clinical Measurements: Goal: Ability to maintain clinical measurements within normal limits will improve Outcome: Progressing   Problem: Clinical Measurements: Goal: Will remain free from infection Outcome: Progressing   Problem: Clinical Measurements: Goal: Diagnostic test results will improve Outcome: Progressing   Problem: Clinical Measurements: Goal: Respiratory complications will improve Outcome: Progressing   Problem: Clinical Measurements: Goal: Cardiovascular complication will be avoided Outcome: Progressing   Problem: Pain Managment: Goal: General experience of comfort will improve and/or be controlled Outcome: Progressing   Problem: Safety: Goal: Ability to remain free from injury will improve Outcome: Progressing   Problem: Skin Integrity: Goal: Risk for impaired skin integrity will decrease Outcome: Progressing   Problem: Clinical Measurements: Goal: Ability to maintain a body temperature in the normal range will improve Outcome: Progressing   Problem: Respiratory: Goal: Ability to maintain adequate ventilation will improve Outcome: Progressing   Problem: Respiratory: Goal: Ability to maintain a clear airway will improve Outcome: Progressing   Problem: Tissue Perfusion: Goal: Adequacy of tissue perfusion will improve Outcome: Progressing    Plan of care, assessment, treatment, monitoring, and intervention (s) ongoing, see MAR see flowsheet

## 2023-12-23 ENCOUNTER — Encounter (HOSPITAL_COMMUNITY): Payer: Self-pay | Admitting: Internal Medicine

## 2023-12-23 ENCOUNTER — Institutional Professional Consult (permissible substitution) (HOSPITAL_COMMUNITY)

## 2023-12-23 ENCOUNTER — Ambulatory Visit: Admitting: Podiatry

## 2023-12-23 ENCOUNTER — Institutional Professional Consult (permissible substitution)
Admission: EM | Admit: 2023-12-23 | Discharge: 2024-01-13 | Disposition: A | Discharge status: Home Health | Attending: Internal Medicine | Admitting: Internal Medicine

## 2023-12-23 ENCOUNTER — Telehealth: Payer: Self-pay

## 2023-12-23 DIAGNOSIS — R0602 Shortness of breath: Secondary | ICD-10-CM | POA: Diagnosis not present

## 2023-12-23 DIAGNOSIS — M79622 Pain in left upper arm: Secondary | ICD-10-CM | POA: Diagnosis not present

## 2023-12-23 DIAGNOSIS — I110 Hypertensive heart disease with heart failure: Secondary | ICD-10-CM | POA: Diagnosis not present

## 2023-12-23 DIAGNOSIS — M00019 Staphylococcal arthritis, unspecified shoulder: Secondary | ICD-10-CM | POA: Diagnosis not present

## 2023-12-23 DIAGNOSIS — R918 Other nonspecific abnormal finding of lung field: Secondary | ICD-10-CM | POA: Diagnosis not present

## 2023-12-23 DIAGNOSIS — E118 Type 2 diabetes mellitus with unspecified complications: Secondary | ICD-10-CM | POA: Diagnosis not present

## 2023-12-23 DIAGNOSIS — Z96612 Presence of left artificial shoulder joint: Secondary | ICD-10-CM | POA: Diagnosis not present

## 2023-12-23 DIAGNOSIS — Z9981 Dependence on supplemental oxygen: Secondary | ICD-10-CM | POA: Diagnosis not present

## 2023-12-23 DIAGNOSIS — J449 Chronic obstructive pulmonary disease, unspecified: Secondary | ICD-10-CM | POA: Diagnosis not present

## 2023-12-23 DIAGNOSIS — Z7401 Bed confinement status: Secondary | ICD-10-CM | POA: Diagnosis not present

## 2023-12-23 DIAGNOSIS — E785 Hyperlipidemia, unspecified: Secondary | ICD-10-CM | POA: Diagnosis not present

## 2023-12-23 DIAGNOSIS — I10 Essential (primary) hypertension: Secondary | ICD-10-CM | POA: Diagnosis not present

## 2023-12-23 DIAGNOSIS — J8410 Pulmonary fibrosis, unspecified: Secondary | ICD-10-CM | POA: Diagnosis not present

## 2023-12-23 DIAGNOSIS — Z452 Encounter for adjustment and management of vascular access device: Secondary | ICD-10-CM | POA: Diagnosis not present

## 2023-12-23 DIAGNOSIS — R079 Chest pain, unspecified: Secondary | ICD-10-CM | POA: Diagnosis not present

## 2023-12-23 DIAGNOSIS — T50905A Adverse effect of unspecified drugs, medicaments and biological substances, initial encounter: Secondary | ICD-10-CM | POA: Diagnosis not present

## 2023-12-23 DIAGNOSIS — G8929 Other chronic pain: Secondary | ICD-10-CM | POA: Diagnosis not present

## 2023-12-23 DIAGNOSIS — Y95 Nosocomial condition: Secondary | ICD-10-CM | POA: Diagnosis not present

## 2023-12-23 DIAGNOSIS — M00012 Staphylococcal arthritis, left shoulder: Secondary | ICD-10-CM | POA: Diagnosis not present

## 2023-12-23 DIAGNOSIS — R262 Difficulty in walking, not elsewhere classified: Secondary | ICD-10-CM | POA: Diagnosis not present

## 2023-12-23 DIAGNOSIS — I5032 Chronic diastolic (congestive) heart failure: Secondary | ICD-10-CM | POA: Diagnosis not present

## 2023-12-23 DIAGNOSIS — J9690 Respiratory failure, unspecified, unspecified whether with hypoxia or hypercapnia: Secondary | ICD-10-CM | POA: Diagnosis not present

## 2023-12-23 DIAGNOSIS — M7989 Other specified soft tissue disorders: Secondary | ICD-10-CM | POA: Diagnosis not present

## 2023-12-23 DIAGNOSIS — R6520 Severe sepsis without septic shock: Secondary | ICD-10-CM | POA: Diagnosis not present

## 2023-12-23 DIAGNOSIS — B9562 Methicillin resistant Staphylococcus aureus infection as the cause of diseases classified elsewhere: Secondary | ICD-10-CM | POA: Diagnosis not present

## 2023-12-23 DIAGNOSIS — J9621 Acute and chronic respiratory failure with hypoxia: Secondary | ICD-10-CM | POA: Diagnosis not present

## 2023-12-23 DIAGNOSIS — E039 Hypothyroidism, unspecified: Secondary | ICD-10-CM | POA: Diagnosis not present

## 2023-12-23 DIAGNOSIS — J849 Interstitial pulmonary disease, unspecified: Secondary | ICD-10-CM | POA: Diagnosis not present

## 2023-12-23 DIAGNOSIS — J159 Unspecified bacterial pneumonia: Secondary | ICD-10-CM | POA: Diagnosis not present

## 2023-12-23 DIAGNOSIS — R32 Unspecified urinary incontinence: Secondary | ICD-10-CM | POA: Diagnosis not present

## 2023-12-23 DIAGNOSIS — K649 Unspecified hemorrhoids: Secondary | ICD-10-CM | POA: Diagnosis not present

## 2023-12-23 LAB — GLUCOSE, CAPILLARY
Glucose-Capillary: 176 mg/dL — ABNORMAL HIGH (ref 70–99)
Glucose-Capillary: 184 mg/dL — ABNORMAL HIGH (ref 70–99)
Glucose-Capillary: 192 mg/dL — ABNORMAL HIGH (ref 70–99)
Glucose-Capillary: 175 mg/dL — ABNORMAL HIGH (ref 70–99)
Glucose-Capillary: 170 mg/dL — ABNORMAL HIGH (ref 70–99)
Glucose-Capillary: 171 mg/dL — ABNORMAL HIGH (ref 70–99)
Glucose-Capillary: 227 mg/dL — ABNORMAL HIGH (ref 70–99)

## 2023-12-23 LAB — CBC WITH DIFFERENTIAL/PLATELET
Abs Immature Granulocytes: 0.14 K/uL — ABNORMAL HIGH (ref 0.00–0.07)
Basophils Absolute: 0 K/uL (ref 0.0–0.1)
Basophils Relative: 0 %
Eosinophils Absolute: 0 K/uL (ref 0.0–0.5)
Eosinophils Relative: 0 %
HCT: 30.2 % — ABNORMAL LOW (ref 36.0–46.0)
Hemoglobin: 9.2 g/dL — ABNORMAL LOW (ref 12.0–15.0)
Immature Granulocytes: 2 %
Lymphocytes Relative: 23 %
Lymphs Abs: 1.5 K/uL (ref 0.7–4.0)
MCH: 28.2 pg (ref 26.0–34.0)
MCHC: 30.5 g/dL (ref 30.0–36.0)
MCV: 92.6 fL (ref 80.0–100.0)
Monocytes Absolute: 0.3 K/uL (ref 0.1–1.0)
Monocytes Relative: 4 %
Neutro Abs: 4.7 K/uL (ref 1.7–7.7)
Neutrophils Relative %: 71 %
Platelets: 344 K/uL (ref 150–400)
RBC: 3.26 MIL/uL — ABNORMAL LOW (ref 3.87–5.11)
RDW: 15.9 % — ABNORMAL HIGH (ref 11.5–15.5)
WBC: 6.7 K/uL (ref 4.0–10.5)
nRBC: 0 % (ref 0.0–0.2)

## 2023-12-23 LAB — COMPREHENSIVE METABOLIC PANEL WITH GFR
ALT: 18 U/L (ref 0–44)
AST: 14 U/L — ABNORMAL LOW (ref 15–41)
Albumin: 1.9 g/dL — ABNORMAL LOW (ref 3.5–5.0)
Alkaline Phosphatase: 95 U/L (ref 38–126)
Anion gap: 8 (ref 5–15)
BUN: 17 mg/dL (ref 8–23)
CO2: 26 mmol/L (ref 22–32)
Calcium: 8.3 mg/dL — ABNORMAL LOW (ref 8.9–10.3)
Chloride: 99 mmol/L (ref 98–111)
Creatinine, Ser: 0.62 mg/dL (ref 0.44–1.00)
GFR, Estimated: >60 mL/min (ref >60)
Glucose, Bld: 175 mg/dL — ABNORMAL HIGH (ref 70–99)
Potassium: 4.3 mmol/L (ref 3.5–5.1)
Sodium: 133 mmol/L — ABNORMAL LOW (ref 135–145)
Total Bilirubin: 0.3 mg/dL (ref 0.0–1.2)
Total Protein: 5.4 g/dL — ABNORMAL LOW (ref 6.5–8.1)

## 2023-12-23 MED ORDER — SULFAMETHOXAZOLE-TRIMETHOPRIM 800-160 MG PO TABS: 1.0000 | ORAL_TABLET | ORAL | Status: DC

## 2023-12-23 MED ORDER — AMLODIPINE BESYLATE 5 MG PO TABS: 5.0000 mg | ORAL_TABLET | Freq: Every day | ORAL | Status: DC

## 2023-12-23 MED ORDER — PREDNISONE 20 MG PO TABS: 80.0000 mg | ORAL_TABLET | Freq: Every day | ORAL | Status: AC

## 2023-12-23 MED ORDER — MORPHINE SULFATE 15 MG PO TABS: 15.0000 mg | ORAL_TABLET | ORAL | Status: DC | PRN

## 2023-12-23 MED ORDER — PREDNISONE 20 MG PO TABS: 80.0000 mg | ORAL_TABLET | Freq: Every day | ORAL | Status: DC

## 2023-12-23 MED ORDER — METHOCARBAMOL 500 MG PO TABS: 500.0000 mg | ORAL_TABLET | Freq: Three times a day (TID) | ORAL | Status: AC | PRN

## 2023-12-23 MED ORDER — INSULIN ASPART 100 UNIT/ML IJ SOLN: 0.0000 [IU] | INTRAMUSCULAR | Status: DC

## 2023-12-23 MED ORDER — VANCOMYCIN HCL IN DEXTROSE 1-5 GM/200ML-% IV SOLN: 1000.0000 mg | INTRAVENOUS | Status: AC

## 2023-12-23 MED ORDER — ENOXAPARIN SODIUM 40 MG/0.4ML IJ SOSY: 40.0000 mg | PREFILLED_SYRINGE | INTRAMUSCULAR | Status: AC

## 2023-12-23 MED ORDER — CLONIDINE HCL 0.1 MG PO TABS: 0.1000 mg | ORAL_TABLET | Freq: Three times a day (TID) | ORAL | Status: AC

## 2023-12-23 MED ORDER — FUROSEMIDE 20 MG PO TABS: 20.0000 mg | ORAL_TABLET | Freq: Every day | ORAL | Status: DC

## 2023-12-23 MED ORDER — MELATONIN 3 MG PO TABS: 3.0000 mg | ORAL_TABLET | Freq: Every evening | ORAL | Status: AC | PRN

## 2023-12-23 MED ORDER — METOPROLOL TARTRATE 100 MG PO TABS: 100.0000 mg | ORAL_TABLET | Freq: Two times a day (BID) | ORAL | Status: DC

## 2023-12-23 MED ORDER — ONDANSETRON HCL 4 MG/2ML IJ SOLN: 4.0000 mg | Freq: Four times a day (QID) | INTRAMUSCULAR | Status: AC | PRN

## 2023-12-23 MED ORDER — MORPHINE SULFATE (PF) 2 MG/ML IV SOLN: 2.0000 mg | INTRAVENOUS | Status: DC | PRN

## 2023-12-23 NOTE — Progress Notes (Signed)
 NAME:  [name], MRN:  [id number], DOB:  [birth year], LOS: 8 ADMISSION DATE:  12/15/2023, CONSULTATION DATE:  12/17/23 REFERRING MD:  Dr. Franky, CHIEF COMPLAINT:  Acute Hypoxic Respiratory Failure   History of Present Illness:  A 83 y.o. female with ILD (?chronic aspiration), chronic hypoxic resp failure on HOT @ 3 L [place] for 20 yrs, restrictive lung disease (mild), septic left shoulder, chronic diastolic heart failure (June 2025, Echo EF 60%, G1DD), ?PE (past hx), HTN, dyslipidemia, hypothyroidism, rheumatoid arthritis, debility, chronic sinus tachycardia, chronic chest pain, and DM-2, who was admitted on 12/15/2023 with progressive SOB and dry cough for 3 days before admission. She was dx with sepsis and community-acquired pneumonia, after presenting from SNF rehab.  She underwent hemiarthroplasty of the left shoulder on 11/15/2023, complicated with septic arthritis, positive for MRSA. Infectious disease was consulted, and the patient was started on daptomycin  via PICC line 11/15/2023, with course of daptomycin  to conclude with final dose to occur on 12/27/2023.   She has no f/c/r, generalized myalgias, or LL edema.   Never smoked. No alcohol  or illicit drug use.  Hospitalist called PCCM team because of wheezing, hypoxia, and being placed on BiPAP 14/8/60%, back up rate 12. IVF was d/c and given IV Lasix  40 mgx1.  Pertinent  Medical History  ILD (?chronic aspiration), chronic hypoxic resp failure on HOT @ 3 L [place] for 20 yrs, restrictive lung disease (mild), septic left shoulder, chronic diastolic heart failure (June 2025, Echo EF 60%, G1DD), ?PE (past hx), HTN, dyslipidemia, hypothyroidism, rheumatoid arthritis, debility, chronic sinus tachycardia, chronic chest pain, DM-2, sigmoid colon stricture s/p colectomy and colostomy 06/2023   Significant Hospital Events: Including procedures, antibiotic start and stop dates in addition to other pertinent events   7/29: admitted for shortness of  breath 7/31: consulted for worsening respiratory status 8/1: remains on HHFNC 8/2 no acute issues overnight remains on 35 L HHFNC 8/3 remains on heated high flow nasal cannula FiO2 down to 59 but flow is back up to 40 L 8/4 remains on HHFNC, FiO2 down to 40; started on high dose steroids and Bactrim  ppx 8/5 on HFNC; continue high dose steroids, good respiratory improvement, likely transfer out tomorrow 8/6 stable on HFNC; will complete high dose IV steroids today and switch to Prednisone  80 mg daily, likely LTACH today  Interim History / Subjective:  Respiratory status much improved. NAEON. No other concerns this morning.  Objective    Blood pressure (!) 160/98, pulse 70, temperature 97.6 F (36.4 C), temperature source Oral, resp. rate 14, height 5' 4 (1.626 m), weight 79.7 kg, SpO2 97%.        Intake/Output Summary (Last 24 hours) at 12/23/2023 0906 Last data filed at 12/23/2023 0548 Gross per 24 hour  Intake 630.17 ml  Output 600 ml  Net 30.17 ml   Filed Weights   12/21/23 0554 12/22/23 0833 12/23/23 0550  Weight: 80.5 kg 80.9 kg 79.7 kg   Examination: General: Awake and Alert in NAD HEENT: NCAT. Sclera anicteric. No rhinorrhea. Cardiovascular: RRR. No M/R/G Respiratory: CTAB, normal WOB on HHFNC. No wheezing, crackles, rhonchi, or diminished breath sounds. Abdomen: Soft, non-tender, non-distended. Bowel sounds normoactive Extremities: Able to move all extremities. No BLE edema, no deformities or significant joint findings. Skin: Warm and dry. Neuro: A&Ox3. No focal neurological deficits.  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 Risk with attempting bronchoscopy is that patient may not  be able to come off the vent with a history of chronic respiratory failure, interstitial lung disease at baseline which may now be exacerbated by an eosinophilic pneumonitis. Received 3 days  total of atypical coverage and completed pneumonia coverage with Zosyn .  Viral panel negative; pending Legionella and Strep pneumo urine antigens P: Continuing vancomycin  for septic left shoulder. Respiratory status has improved since increasing the steroids and is slowly weaning on HF. Goal is to get her back to her baseline of 3L.  - Continue methylprednisolone  125 mg q6h for 8 doses, to complete today, will wean slowly - Continue HFNC, wean as able - Remains too high risk for bronc - Encourage incentive spirometry - Mobilize as able - PT/OT eval and treat - Aspiration precautions   Septic left shoulder MRSA Daptomycin  (PTA-7/31). Transitioned to Vanc (7/31-8/10) and Zosyn  on (7/31 - 8/4). P: - Continue vancomycin  as above  History of diastolic dysfunction P: - Resume home diuretics at reduced dose of Lasix  20 mg daily - Continuous telemetry - Optimize electrolytes as needed  Rheumatoid arthritis and interstitial lung disease Was chronically on prednisone  10 mg daily at home. P: - Continue Solumedrol 125 mg q6h x 48h (8/4-8/6) and taper to Prednisone  80 mg - Plan to taper 10-20 mg every 2 weeks - Bactrim  ppx 800-160 mg MWF and PPI BID   Hypothyroidism P: - Continue home levothyroxine  50 mcg daily   Hypertension BP elevated to 130-150s/80-90s. Contraindication to ACEi/ARBs with hx of swelling. P: - Remains on home clonidine  0.1 mg TID and metoprolol  100 mg BID - Continue Amlodipine  5 mg daily    T2DM CBGs stable. Monitor sugars closely with increased steroid dose. P: - Continue sensitive scale SSI - CBG checks ACHS - CBG goal 140-180  Best Practice (right click and Reselect all SmartList Selections daily)   Diet/type: Regular consistency (see orders) DVT prophylaxis LMWH and SCDs Pressure ulcer(s): present on admission  GI prophylaxis: PPI Lines: yes and it is still needed Foley:  N/A and removal ordered  Code Status:  full code Last date of  multidisciplinary goals of care discussion: Continue to update family daily  Labs   CBC: Recent Labs  Lab 12/16/23 1655 12/17/23 0103 12/20/23 0410 12/22/23 2002 12/23/23 0358  WBC 16.7* 17.4* 6.2 6.3 6.7  NEUTROABS  --   --   --  4.5 4.7  HGB 9.1* 10.0* 8.3* 9.4* 9.2*  HCT 29.3* 32.1* 27.0* 30.8* 30.2*  MCV 92.7 92.5 92.5 93.1 92.6  PLT 241 275 271 313 344    Basic Metabolic Panel: Recent Labs  Lab 12/19/23 0343 12/20/23 0410 12/21/23 1114 12/22/23 0904 12/23/23 0358  NA 138 136 134* 133* 133*  K 3.5 4.3 3.2* 4.7 4.3  CL 102 101 100 100 99  CO2 23 23 24 24 26   GLUCOSE 102* 87 106* 161* 175*  BUN 30* 28* 18 16 17   CREATININE 1.30* 0.97 0.79 0.71 0.62  CALCIUM  8.1* 8.1* 7.9* 8.2* 8.3*  MG 1.8 2.0  --   --   --    GFR: Estimated Creatinine Clearance: 55.4 mL/min (by C-G formula based on SCr of 0.62 mg/dL). Recent Labs  Lab 12/17/23 0103 12/17/23 0909 12/20/23 0410 12/22/23 2002 12/23/23 0358  PROCALCITON  --  0.48  --   --   --   WBC 17.4*  --  6.2 6.3 6.7    Liver Function Tests: Recent Labs  Lab 12/17/23 0103 12/22/23 0904 12/23/23 0358  AST 34 15 14*  ALT 25 19 18   ALKPHOS 177* 108 95  BILITOT 0.9 0.4 0.3  PROT 5.8* 5.7* 5.4*  ALBUMIN  1.9* 2.0* 1.9*   No results for input(s): LIPASE, AMYLASE in the last 168 hours. No results for input(s): AMMONIA in the last 168 hours.  ABG    Component Value Date/Time   PHART 7.460 (H) 03/30/2021 1638   PCO2ART 46.6 03/30/2021 1638   PO2ART 70 (L) 03/30/2021 1638   HCO3 27.8 12/17/2023 0118   TCO2 24 07/15/2023 1215   O2SAT 52.5 12/17/2023 0118     Coagulation Profile: No results for input(s): INR, PROTIME in the last 168 hours.  Cardiac Enzymes: No results for input(s): CKTOTAL, CKMB, CKMBINDEX, TROPONINI in the last 168 hours.   HbA1C: Hemoglobin A1C  Date/Time Value Ref Range Status  10/15/2023 02:48 PM 6.0 (A) 4.0 - 5.6 % Final   Hgb A1c MFr Bld  Date/Time Value Ref  Range Status  12/18/2023 04:12 AM 5.5 4.8 - 5.6 % Final    Comment:    (NOTE)         Prediabetes: 5.7 - 6.4         Diabetes: >6.4         Glycemic control for adults with diabetes: <7.0   06/14/2023 03:16 AM 5.6 4.8 - 5.6 % Final    Comment:    (NOTE) Pre diabetes:          5.7%-6.4%  Diabetes:              >6.4%  Glycemic control for   <7.0% adults with diabetes     CBG: Recent Labs  Lab 12/22/23 0307 12/22/23 0747 12/22/23 2355 12/23/23 0327 12/23/23 0808  GLUCAP 162* 163* 173* 170* 171*    Review of Systems:   As above  Past Medical History:  She,  has a past medical history of Acute encephalopathy (03/30/2021), Acute kidney injury superimposed on chronic kidney disease (HCC) (04/17/2015), Acute on chronic diastolic (congestive) heart failure (HCC), Anemia, Anxiety, Arthritis, Asthma, Carotid artery occlusion, Chest pain of uncertain etiology (01/28/2012), Chronic fatigue, Chronic kidney disease, Closed intertrochanteric fracture of hip, left, initial encounter (HCC) (11/29/2016), Closed left hip fracture (HCC), Closed nondisplaced intertrochanteric fracture of left femur with delayed healing (01/14/2017), Clotting disorder (HCC), Contusion of left knee, COPD (chronic obstructive pulmonary disease) (HCC), Coronary artery disease, Depression, reactive, Diabetes mellitus, Diastolic dysfunction, Difficulty in walking, Family history of heart disease, Flu (06/26/2023), Generalized muscle weakness, Gout, Hardware complicating wound infection (HCC) (12/08/2023), High cholesterol, History of falling, Hypertension, Hypothyroidism, Interstitial lung disease (HCC), 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   Surgical History:   Past Surgical History:  Procedure Laterality Date   BRAIN SURGERY     Gamma knife 10/13. Needs repeat spring  '14   CARDIAC CATHETERIZATION N/A 10/31/2014   Procedure: Right/Left Heart Cath and Coronary Angiography;  Surgeon: Candyce GORMAN Reek, MD;  Location: [facility] INVASIVE CV LAB;  Service: Cardiovascular;  Laterality: N/A;   COLONOSCOPY WITH PROPOFOL  N/A 04/14/2022   Procedure: COLONOSCOPY WITH PROPOFOL ;  Surgeon: Albertus Gordy HERO, MD;  Location: [facility] ENDOSCOPY;  Service: Gastroenterology;  Laterality: N/A;   CYST EXCISION     2 on face   CYST REMOVAL NECK  ESOPHAGOGASTRODUODENOSCOPY (EGD) WITH PROPOFOL  N/A 09/14/2014   Procedure: ESOPHAGOGASTRODUODENOSCOPY (EGD) WITH PROPOFOL ;  Surgeon: Lamar JONETTA Aho, MD;  Location: [facility] ENDOSCOPY;  Service: Endoscopy;  Laterality: N/A;   INCISION AND DRAINAGE HIP Left 01/16/2017   Procedure: IRRIGATION AND DEBRIDEMENT LEFT HIP;  Surgeon: Vernetta Lonni GRADE, MD;  Location: [facility] ORS;  Service: Orthopedics;  Laterality: Left;   INTRAMEDULLARY (IM) NAIL INTERTROCHANTERIC Left 11/29/2016   Procedure: INTRAMEDULLARY (IM) NAIL INTERTROCHANTRIC;  Surgeon: Vernetta Lonni GRADE, MD;  Location: [facility] OR;  Service: Orthopedics;  Laterality: Left;   LAPAROSCOPIC LOOP COLOSTOMY N/A 07/17/2023   Procedure: LAPAROSCOPIC DIVERTING LOOP COLOSTOMY;  Surgeon: Belinda Cough, MD;  Location: [facility] OR;  Service: General;  Laterality: N/A;   OVARY SURGERY     RIGHT HEART CATH N/A 08/12/2021   Procedure: RIGHT HEART CATH;  Surgeon: Court Dorn PARAS, MD;  Location: [facility] INVASIVE CV LAB;  Service: Cardiovascular;  Laterality: N/A;   SHOULDER HEMI-ARTHROPLASTY Left 11/15/2023   Procedure: HEMIARTHROPLASTY, SHOULDER;  Surgeon: Cristy Bonner DASEN, MD;  Location: [facility] OR;  Service: Orthopedics;  Laterality: Left;  with Block   SHOULDER SURGERY Left    TONSILLECTOMY  age 42   VAGINAL  HYSTERECTOMY     VIDEO BRONCHOSCOPY Bilateral 05/31/2013   Procedure: VIDEO BRONCHOSCOPY WITHOUT FLUORO;  Surgeon: Dorethia Cave, MD;  Location: [facility] ENDOSCOPY;  Service: Cardiopulmonary;  Laterality: Bilateral;   video bronscoscopy  05/19/1998   lung     Social History:   reports that she has never smoked. She has never used smokeless tobacco. She reports that she does not drink alcohol  and does not use drugs.   Family History:  Her family history includes Breast cancer in her sister and sister; Diabetes in her brother, mother, and sister; Esophageal cancer in her brother; Fibroids in her daughter; Gout in her brother; Heart attack in her brother and mother; Heart disease in her brother; Heart murmur in her son; Hypertension in her brother, daughter, daughter, father, and son; Kidney cancer in her brother; Kidney failure in her brother; Lung cancer in her father; Prostate cancer in her brother; Rheum arthritis in her maternal uncle; Stomach cancer in her brother; Uterine cancer in her daughter. There is no history of Alzheimer's disease or Dementia.   Allergies Allergies  Allergen Reactions   Codeine Swelling and Other (See Comments)    Facial and leg swelling Chest pain    Remicade [Infliximab] Anaphylaxis    sent me into shock    Zestril [Lisinopril] Swelling and Rash    Face and neck swelling    Ofev  [Nintedanib] Diarrhea     Home Medications  Prior to Admission medications   Medication Sig Start Date End Date Taking? Authorizing Provider  acetaminophen  (TYLENOL ) 500 MG tablet Take 1,000 mg by mouth every 8 (eight) hours as needed (pain).   Yes [provider]  albuterol  (VENTOLIN  HFA) 108 (90 Base) MCG/ACT inhaler Inhale 2 puffs into the lungs in the morning and at bedtime.   Yes [provider]  bisacodyl  (DULCOLAX) 10 MG suppository Place 10 mg rectally daily as needed (bowel management).   Yes [provider]  Cholecalciferol  (VITAMIN D3) 50  MCG (2000 UT) TABS Take 2,000 Units by mouth daily.   Yes [provider]  daptomycin  (CUBICIN ) IVPB Inject 700 mg into the vein daily. Indication:  MRSA Bacteremia/septic arthritis of shoulder First Dose: Yes Last Day of Therapy:  12/27/23 Labs - Once weekly:  CBC/D, BMP, and CPK Labs - Once weekly: ESR and  CRP Patient taking differently: Inject 700 mg into the vein daily. Indication:  MRSA Bacteremia/septic arthritis of shoulder First Dose: Yes Last Day of Therapy:  12/27/23 Labs - Once weekly:  CBC/D, BMP, and CPK Labs - Once weekly: ESR and CRP Start date 11/23/23 11/22/23 12/31/23 Yes Darci Pore, MD  diclofenac  Sodium (VOLTAREN ) 1 % GEL Apply 2 g topically 3 (three) times daily. 08/04/23  Yes Angiulli, Toribio PARAS, PA-C  DULoxetine  (CYMBALTA ) 60 MG capsule Take 1 capsule (60 mg total) by mouth daily. 08/05/23  Yes Angiulli, Toribio PARAS, PA-C  feeding supplement (ENSURE PLUS HIGH PROTEIN) LIQD Take 237 mLs by mouth 2 (two) times daily between meals. 11/22/23  Yes Darci Pore, MD  furosemide  (LASIX ) 40 MG tablet Take 1 tablet (40 mg total) by mouth daily. 08/05/23  Yes Angiulli, Toribio PARAS, PA-C  gabapentin  (NEURONTIN ) 300 MG capsule Take 1 capsule (300 mg total) by mouth 2 (two) times daily. 08/04/23  Yes Angiulli, Toribio PARAS, PA-C  guaiFENesin  (MUCINEX ) 600 MG 12 hr tablet Take 2 tablets (1,200 mg total) by mouth 2 (two) times daily. 08/04/23  Yes Angiulli, Toribio PARAS, PA-C  heparin  lock flush 100 UNIT/ML SOLN injection Inject 5 mLs into the vein daily. 11/23/23 12/27/23 Yes [provider]  hydrocortisone  (ANUSOL -HC) 2.5 % rectal cream Place 1 Application rectally 2 (two) times daily.   Yes [provider]  hyoscyamine  (LEVSIN  SL) 0.125 MG SL tablet Place 1 tablet (0.125 mg total) under the tongue every 8 (eight) hours as needed. Patient taking differently: Place 0.125 mg under the tongue every 8 (eight) hours as needed for cramping. 11/04/23  Yes Zehr, Jessica D, PA-C   levothyroxine  (SYNTHROID ) 50 MCG tablet Take 1 tablet (50 mcg total) by mouth daily. 11/28/22  Yes Mercer Clotilda SAUNDERS, MD  methocarbamol  (ROBAXIN ) 500 MG tablet Take 1 tablet (500 mg total) by mouth every 6 (six) hours as needed for muscle spasms (pain). 08/04/23  Yes Angiulli, Toribio PARAS, PA-C  metoprolol  tartrate (LOPRESSOR ) 25 MG tablet Take 2 tablets (50 mg total) by mouth 2 (two) times daily. 11/22/23  Yes Darci Pore, MD  morphine  (MS CONTIN ) 15 MG 12 hr tablet Take 1 tablet (15 mg total) by mouth every 12 (twelve) hours. 11/22/23  Yes Darci Pore, MD  Multiple Vitamin (MULTIVITAMIN) tablet Take 1 tablet by mouth daily.   Yes [provider]  nitroGLYCERIN  (NITROSTAT ) 0.4 MG SL tablet Place 1 tablet (0.4 mg total) under the tongue every 5 (five) minutes as needed for chest pain. 08/04/23  Yes Angiulli, Toribio PARAS, PA-C  oxyCODONE  (OXY IR/ROXICODONE ) 5 MG immediate release tablet Take 1 pills every 6 hrs as needed for severe breakthrough pain Patient taking differently: Take 5 mg by mouth every 6 (six) hours as needed for moderate pain (pain score 4-6) or severe pain (pain score 7-10). 11/17/23  Yes McBane, Aleck SAILOR, PA-C  OXYGEN  Inhale 3 L/min into the lungs at bedtime. As needed during day time   Yes [provider]  pantoprazole  (PROTONIX ) 40 MG tablet Take 1 tablet (40 mg total) by mouth 2 (two) times daily. 06/23/23  Yes Mercer Clotilda SAUNDERS, MD  predniSONE  (DELTASONE ) 10 MG tablet Take 1 tablet (10 mg total) by mouth daily with breakfast. 10/15/23  Yes Mercer Clotilda SAUNDERS, MD  sulfamethoxazole -trimethoprim  (BACTRIM  DS) 800-160 MG tablet Take 2 tablets by mouth 2 (two) times daily. Patient taking differently: Take 2 tablets by mouth 2 (two) times daily. Start 12/09/23 End 12/10/23 12/09/23  Yes Fleeta Rothman,  Jomarie SAILOR, MD  atorvastatin  (LIPITOR) 10 MG tablet Take 1 tablet (10 mg total) by mouth daily. Patient not taking: Reported on 12/16/2023 08/14/22   Mercer Clotilda SAUNDERS, MD   leptospermum manuka honey (MEDIHONEY) PSTE paste Apply 1 Application topically daily. Interchangeable with TheraHoney Apply thin layer (3 mm) to wound. Patient not taking: Reported on 12/16/2023 08/15/23   Rai, Nydia POUR, MD  lidocaine  (LIDODERM ) 5 % Place 1 patch onto the skin daily. Remove & Discard patch within 12 hours or as directed by MD Patient not taking: Reported on 12/16/2023 11/09/23   Debby Fidela CROME, NP  potassium chloride  SA (KLOR-CON  M) 20 MEQ tablet Take 2 tablets (40 mEq total) by mouth 2 (two) times daily. Patient not taking: Reported on 12/16/2023 11/22/23   Darci Pore, MD     Critical care time:  0 minutes    Patient is stable to discharge from the ICU to transition to [facility] today.

## 2023-12-23 NOTE — TOC Transition Note (Signed)
 Transition of Care [facility]) - Discharge Note   Patient Details  Name: [name] MRN: [id number] Date of Birth: 1940-08-25  Transition of Care [facility]) CM/SW Contact:  Tom-Johnson, Shadeed Colberg Daphne, RN Phone Number: 12/23/2023, 1:30 PM   Clinical Narrative:     Patient is scheduled [facility].  Readmission Risk Assessment done. Son, Tod and daughter, Doneta notified of discharge.  In-hospital transport via bed at discharge.  No further TOC needs noted.        Final next level of care: Long Term Acute Care (LTAC) Barriers to Discharge: Barriers Resolved   Patient Goals and CMS Choice Patient states their goals for this hospitalization and ongoing recovery are:: To go to [facility] and return home CMS Medicare.gov Compare Post Acute Care list provided to:: Patient Choice offered to / list presented to : Patient, Adult Children Memory and [place])      Discharge Placement                Patient to be transferred to facility by: In-hospital transport via bed. Name of family member notified: Son Tod and daughter, Tonia    Discharge Plan and Services Additional resources added to the After Visit Summary for                  DME Arranged: N/A DME Agency: NA       HH Arranged: NA HH Agency: NA        Social Drivers of Health (SDOH) Interventions SDOH Screenings   Food Insecurity: No Food Insecurity (12/16/2023)  Housing: Low Risk  (12/16/2023)  Transportation Needs: No Transportation Needs (12/16/2023)  Utilities: Not At Risk (12/16/2023)  Alcohol  Screen: Low Risk  (10/30/2023)  Depression (PHQ2-9): Low Risk  (11/09/2023)  Recent Concern: Depression (PHQ2-9) - Medium Risk (10/15/2023)  Financial Resource Strain: Low Risk  (10/30/2023)  Physical Activity: Insufficiently Active (10/30/2023)  Social Connections: Socially Integrated (12/16/2023)  Stress: No Stress Concern Present (10/30/2023)  Tobacco Use: Low Risk  (12/16/2023)   Health Literacy: Adequate Health Literacy (10/30/2023)     Readmission Risk Interventions    12/23/2023    1:27 PM 07/17/2023    3:28 PM 07/08/2023   10:09 AM  Readmission Risk Prevention Plan  Transportation Screening Complete Complete Complete  Medication Review (RN Care Manager) Referral to Pharmacy Complete Referral to Pharmacy  PCP or Specialist appointment within 3-5 days of discharge Complete Complete Complete  HRI or Home Care Consult Complete Complete Complete  SW Recovery Care/Counseling Consult Complete Complete   Palliative Care Screening Not Applicable Not Applicable Not Applicable  Skilled Nursing Facility Complete Complete Complete

## 2023-12-23 NOTE — Telephone Encounter (Signed)
 She has an appointment with [facility] on November.

## 2023-12-23 NOTE — Progress Notes (Signed)
 Speech Language Pathology Treatment: Dysphagia  Patient Details Name: [name] MRN: [id number] DOB: Apr 26, 1941 Today's Date: 12/23/2023 Time: 1229-1251 SLP Time Calculation (min) (ACUTE ONLY): 22 min  Assessment / Plan / Recommendation Clinical Impression  Pt observed with part of lunch tray with son present (different son than SLP spoke with yesterday). Reviewed results of MBS and pt able to recall 2 of 3 compensatory strategies and needed moderate verbal/visual reminders to implement during meal (primarily hard cough after swallow and swallow twice intermittently). SLP initially recommended hard cough after every other swallow however now recommend after every sip for ease of pt recall). Mild cues to flex chin all the way to chest prior to swallow. She did have a subtle wet vocal quality at end of session and cued her to cough which cleared. Pt's dysphagia is chronic and she will likely have transient episodes of laryngeal intrusion despite strategies but mitigated with use of strategies and she needs continued practice and reiteration. Mastication was functional and she states she tries to eat soft foods due to GI issues and did complain that food sticks in her mouth but did not have significant residue. Although the chin tuck was intermittently effective it is still recommended however the hard cough after sips is most important. Pt is being discharged to [facility] today. Recommend continue regular texture, thin liquids, chin tuck with thin, hard cough after sips, swallow 2 times intermittently and will need full supervision until she is consistent with strategies.    HPI HPI: 83 y.o. female with ILD (?chronic aspiration), chronic hypoxic resp failure on HOT @ 3 L [place] for 20 yrs, restrictive lung disease (mild), septic left shoulder, chronic diastolic heart failure, COPD, ?PE (past hx), HTN, dyslipidemia, hypothyroidism, rheumatoid arthritis, debility, chronic sinus tachycardia,  chronic chest pain, and DM-2, who was admitted on 12/15/2023 with progressive SOB and dry cough for 3 days before admission. She was dx with sepsis and community-acquired pneumonia, after presenting from SNF rehab. [name], NP states she suspects pna may be from medication she was taking versus aspiration. BSE 06/20/2023 without overt s/s aspiration, recommended Dys 3/thin.      SLP Plan  Continue with current plan of care;Other (Comment) (Pt being discharged to [facility] today)          Recommendations  Diet recommendations: Regular;Thin liquid Liquids provided via: Cup;Straw Medication Administration: Other (Comment) (crush larger pills) Supervision: Patient able to self feed;Full supervision/cueing for compensatory strategies (initially full) Compensations: Slow rate;Small sips/bites;Chin tuck;Hard cough after swallow;Multiple dry swallows after each bite/sip Postural Changes and/or Swallow Maneuvers: Seated upright 90 degrees                  Oral care BID   Frequent or constant Supervision/Assistance Dysphagia, pharyngeal phase (R13.13)     Continue with current plan of care;Other (Comment) (Pt being discharged to [facility] today)     [name]  12/23/2023, 1:21 PM

## 2023-12-23 NOTE — Discharge Summary (Signed)
 Physician Discharge Summary  Patient ID: [name] MRN: [id number] DOB/AGE: 82-May-1942 82 y.o.  Admit date: 12/15/2023 Discharge date: 12/23/2023  Problem List Principal Problem:   CAP (community acquired pneumonia) Active Problems:   HLD (hyperlipidemia)   Septic arthritis (HCC)   Essential hypertension   SOB (shortness of breath)   Chronic hypoxic respiratory failure (HCC)   AKI (acute kidney injury) (HCC)   DM2 (diabetes mellitus, type 2) (HCC)   Acquired hypothyroidism   Chronic diastolic CHF (congestive heart failure) (HCC)   Severe sepsis (HCC)   Troponin level elevated   History of pulmonary fibrosis   History of COPD   Anemia of chronic disease  HPI: 83 y.o. female with ILD (?chronic aspiration), chronic hypoxic resp failure on HOT @ 3 L [place] for 20 yrs, restrictive lung disease (mild), septic left shoulder, chronic diastolic heart failure (June 2025, Echo EF 60%, G1DD), ?PE (past hx), HTN, dyslipidemia, hypothyroidism, rheumatoid arthritis, debility, chronic sinus tachycardia, chronic chest pain, and DM-2, who was admitted on 12/15/2023 with progressive SOB and dry cough for 3 days before admission. She was dx with sepsis and community-acquired pneumonia, after presenting from SNF rehab.   She underwent hemiarthroplasty of the left shoulder on 11/15/2023, complicated with septic arthritis, positive for MRSA. Infectious disease was consulted, and the patient was started on daptomycin  via PICC line 11/15/2023, with course of daptomycin  to conclude with final dose to occur on 12/27/2023.   She has no f/c/r, generalized myalgias, or LL edema.   Never smoked. No alcohol  or illicit drug use.  Hospitalist called PCCM team because of wheezing, hypoxia, and being placed on BiPAP 14/8/60%, back up rate 12. IVF was d/c and given IV Lasix  40 mgx1.  Hospital Course: Patient admitted for acute hypoxic respiratory failure likely due to daptomycin  induced eosinophilic pneumonitis due to  treatment of left shoulder septic arthritis.  Patient has history of interstitial lung disease and requires 3 L of oxygen  at home.  Received 3 days total of atypical coverage and completed pneumonia coverage with Zosyn .  Started on vancomycin  for MRSA coverage to continue to treat L septic shoulder s/p hemiarthroscopy on 11/15/2023.  Bronchoscopy was not performed due to high risk of possibly not being able to come off the vent with her history of chronic respiratory failure and interstitial lung disease.  Remained on HHFNC and weaned as able on her FiO2 down to HFNC 10L.  Started high-dose IV steroids while in the ICU which was weaned to prednisone  80 mg daily, plan is to continue to taper 10 -20 mg every 2 weeks back to her baseline home dose.  Bactrim  prophylaxis MWF was also provided due to immunosuppression with steroids.  Symptomatic improvement with high-dose steroids and antibiotics, patient is stable for LTACH.  Patient to follow-up with pulmonologist Dr. Geronimo outpatient.  Labs at discharge Lab Results  Component Value Date   CREATININE 0.62 12/23/2023   BUN 17 12/23/2023   NA 133 (L) 12/23/2023   K 4.3 12/23/2023   CL 99 12/23/2023   CO2 26 12/23/2023   Lab Results  Component Value Date   WBC 6.7 12/23/2023   HGB 9.2 (L) 12/23/2023   HCT 30.2 (L) 12/23/2023   MCV 92.6 12/23/2023   PLT 344 12/23/2023   Lab Results  Component Value Date   ALT 18 12/23/2023   AST 14 (L) 12/23/2023   ALKPHOS 95 12/23/2023   BILITOT 0.3 12/23/2023   Lab Results  Component Value Date   INR  1.2 07/15/2023   INR 1.1 07/01/2023   INR 1.1 06/14/2023    Current radiology studies CTA Chest w/ contrast No definite findings to pulmonary embolism. Faint linear filling defects superimposing the right lower lobe subsegmental pulmonary arteries likely artifactual.   Diffuse crazy paving appearance of the lung fields which can be seen the setting of pulmonary edema, acute respiratory distress  syndrome, multifocal infection/inflammation (pneumonia including COVID-19) and pulmonary alveolar proteinosis. Correlate with clinical findings.  CT Abdomen and Pelvis 1. No acute intra-abdominal or intrapelvic abnormality. No pneumoperitoneum. 2. Thinning of the subcutaneous soft tissues overlying the distal sacrococcygeal spine, which may be related to decubitus ulcer.  3. Punctate nonobstructing right renal stones. 4. Colonic diverticulosis without acute diverticulitis. 5. Aortic Atherosclerosis (ICD10-I70.0).  CXR *Redemonstration of diffuse alveolar and interstitial opacities throughout bilateral lungs with relative sparing of the right mid lung zone. No significant interval change except the right mid lung zone. Findings are nonspecific and differential diagnosis includes multi lobar pneumonia, pulmonary edema or ARDS. *Stable positioning of the right-sided PICC line.  Swallow Eval Recommendations: PO diet PO Diet Recommendation: Regular; Thin liquids (Level 0) Liquid Administration via: Cup; Straw Medication Administration: Whole meds with puree (crush large) Supervision: Patient able to self-feed; Full supervision/cueing for swallowing strategies Swallowing strategies  : Slow rate; Small bites/sips; Multiple dry swallows after each bite/sip; Hard cough after swallowing; Chin tuck (chin tuck with thin) Postural changes: Position pt fully upright for meals Oral care recommendations: Oral care BID (2x/day)   Disposition: LTACH and then Home     Allergies as of 12/23/2023       Reactions   Codeine Swelling, Other (See Comments)   Facial and leg swelling Chest pain   Remicade [infliximab] Anaphylaxis   sent me into shock   Zestril [lisinopril] Swelling, Rash   Face and neck swelling   Ofev  [nintedanib] Diarrhea        Medication List     PAUSE taking these medications    albuterol  108 (90 Base) MCG/ACT inhaler Wait to take this until your doctor or other care  provider tells you to start again. Commonly known as: VENTOLIN  HFA Inhale 2 puffs into the lungs in the morning and at bedtime.   bisacodyl  10 MG suppository Wait to take this until your doctor or other care provider tells you to start again. Commonly known as: DULCOLAX Place 10 mg rectally daily as needed (bowel management).   diclofenac  Sodium 1 % Gel Wait to take this until your doctor or other care provider tells you to start again. Commonly known as: VOLTAREN  Apply 2 g topically 3 (three) times daily.   feeding supplement Liqd Wait to take this until your doctor or other care provider tells you to start again. Take 237 mLs by mouth 2 (two) times daily between meals.   heparin  lock flush 100 UNIT/ML Soln injection Wait to take this until your doctor or other care provider tells you to start again. Inject 5 mLs into the vein daily.   hydrocortisone  2.5 % rectal cream Wait to take this until your doctor or other care provider tells you to start again. Commonly known as: ANUSOL -HC Place 1 Application rectally 2 (two) times daily.   hyoscyamine  0.125 MG SL tablet Wait to take this until your doctor or other care provider tells you to start again. Commonly known as: LEVSIN  SL Place 1 tablet (0.125 mg total) under the tongue every 8 (eight) hours as needed. What changed: reasons to take this  leptospermum manuka honey Pste paste Wait to take this until your doctor or other care provider tells you to start again. Apply 1 Application topically daily. Interchangeable with TheraHoney Apply thin layer (3 mm) to wound.   lidocaine  5 % Wait to take this until your doctor or other care provider tells you to start again. Commonly known as: LIDODERM  Place 1 patch onto the skin daily. Remove & Discard patch within 12 hours or as directed by MD   morphine  15 MG 12 hr tablet Wait to take this until your doctor or other care provider tells you to start again. Commonly known as: MS  CONTIN Take 1 tablet (15 mg total) by mouth every 12 (twelve) hours. You also have another medication with the same name that you may need to continue taking.   multivitamin tablet Wait to take this until your doctor or other care provider tells you to start again. Take 1 tablet by mouth daily.   oxyCODONE  5 MG immediate release tablet Wait to take this until your doctor or other care provider tells you to start again. Commonly known as: Oxy IR/ROXICODONE  Take 1 pills every 6 hrs as needed for severe breakthrough pain What changed:  how much to take how to take this when to take this reasons to take this additional instructions   Vitamin D3 50 MCG (2000 UT) Tabs Wait to take this until your doctor or other care provider tells you to start again. Take 2,000 Units by mouth daily.       STOP taking these medications    apixaban  2.5 MG Tabs tablet Commonly known as: ELIQUIS    daptomycin  IVPB Commonly known as: CUBICIN    potassium chloride  SA 20 MEQ tablet Commonly known as: KLOR-CON  M       TAKE these medications    acetaminophen  500 MG tablet Commonly known as: TYLENOL  Take 1,000 mg by mouth every 8 (eight) hours as needed (pain).   amLODipine  5 MG tablet Commonly known as: NORVASC  Take 1 tablet (5 mg total) by mouth daily. Start taking on: December 24, 2023   atorvastatin  10 MG tablet Commonly known as: LIPITOR Take 1 tablet (10 mg total) by mouth daily.   cloNIDine  0.1 MG tablet Commonly known as: CATAPRES  Take 1 tablet (0.1 mg total) by mouth 3 (three) times daily.   DULoxetine  60 MG capsule Commonly known as: CYMBALTA  Take 1 capsule (60 mg total) by mouth daily.   enoxaparin  40 MG/0.4ML injection Commonly known as: LOVENOX  Inject 0.4 mLs (40 mg total) into the skin daily. Start taking on: December 24, 2023   furosemide  20 MG tablet Commonly known as: LASIX  Take 1 tablet (20 mg total) by mouth daily. Start taking on: December 24, 2023 What changed:   medication strength how much to take   gabapentin  300 MG capsule Commonly known as: NEURONTIN  Take 1 capsule (300 mg total) by mouth 2 (two) times daily.   guaiFENesin  600 MG 12 hr tablet Commonly known as: MUCINEX  Take 2 tablets (1,200 mg total) by mouth 2 (two) times daily.   insulin  aspart 100 UNIT/ML injection Commonly known as: novoLOG  Inject 0-9 Units into the skin every 4 (four) hours.   levothyroxine  50 MCG tablet Commonly known as: SYNTHROID  Take 1 tablet (50 mcg total) by mouth daily.   melatonin 3 MG Tabs tablet Take 1 tablet (3 mg total) by mouth at bedtime as needed (insomnia).   methocarbamol  500 MG tablet Commonly known as: ROBAXIN  Take 1 tablet (500 mg total)  by mouth every 8 (eight) hours as needed for muscle spasms. What changed:  when to take this reasons to take this   metoprolol  tartrate 100 MG tablet Commonly known as: LOPRESSOR  Take 1 tablet (100 mg total) by mouth 2 (two) times daily. What changed:  medication strength how much to take   morphine  15 MG tablet Commonly known as: MSIR Take 1 tablet (15 mg total) by mouth every 4 (four) hours as needed for moderate pain (pain score 4-6). What changed: Another medication with the same name was paused. Ask your nurse or doctor if you should take this medication.   morphine  (PF) 2 MG/ML injection Inject 1 mL (2 mg total) into the vein every 4 (four) hours as needed. What changed: Another medication with the same name was paused. Ask your nurse or doctor if you should take this medication.   nitroGLYCERIN  0.4 MG SL tablet Commonly known as: NITROSTAT  Place 1 tablet (0.4 mg total) under the tongue every 5 (five) minutes as needed for chest pain.   ondansetron  4 MG/2ML Soln injection Commonly known as: ZOFRAN  Inject 2 mLs (4 mg total) into the vein every 6 (six) hours as needed for nausea or vomiting.   OXYGEN  Inhale 3 L/min into the lungs at bedtime. As needed during day time   pantoprazole  40  MG tablet Commonly known as: PROTONIX  Take 1 tablet (40 mg total) by mouth 2 (two) times daily.   predniSONE  20 MG tablet Commonly known as: DELTASONE  Take 4 tablets (80 mg total) by mouth daily with breakfast. Start taking on: December 24, 2023 What changed:  medication strength how much to take   sulfamethoxazole -trimethoprim  800-160 MG tablet Commonly known as: BACTRIM  DS Take 1 tablet by mouth 3 (three) times a week. Start taking on: December 25, 2023 What changed:  how much to take when to take this   vancomycin  1-5 GM/200ML-% Soln Commonly known as: VANCOCIN  Inject 200 mLs (1,000 mg total) into the vein daily for 4 days. Start taking on: December 24, 2023        Discharged Condition: stable  Time spent on discharge greater than 40 minutes.  Vital signs at Discharge. Temp:  [96.5 F (35.8 C)-97.7 F (36.5 C)] 97.4 F (36.3 C) (08/06 1144) Pulse Rate:  [62-77] 68 (08/06 1000) Resp:  [9-25] 20 (08/06 1000) BP: (127-168)/(76-104) 144/87 (08/06 1000) SpO2:  [96 %-100 %] 96 % (08/06 1000) Weight:  [79.7 kg] 79.7 kg (08/06 0550)  Signed: Kathrine Melena, DO 12/23/23 12:11 PM [facility], PGY-2

## 2023-12-24 DIAGNOSIS — G8929 Other chronic pain: Secondary | ICD-10-CM | POA: Diagnosis not present

## 2023-12-24 DIAGNOSIS — J9621 Acute and chronic respiratory failure with hypoxia: Secondary | ICD-10-CM | POA: Diagnosis not present

## 2023-12-24 DIAGNOSIS — I10 Essential (primary) hypertension: Secondary | ICD-10-CM | POA: Diagnosis not present

## 2023-12-24 LAB — COMPREHENSIVE METABOLIC PANEL WITH GFR
ALT: 15 U/L (ref 0–44)
AST: 11 U/L — ABNORMAL LOW (ref 15–41)
Albumin: 1.9 g/dL — ABNORMAL LOW (ref 3.5–5.0)
Alkaline Phosphatase: 81 U/L (ref 38–126)
Anion gap: 8 (ref 5–15)
BUN: 21 mg/dL (ref 8–23)
CO2: 26 mmol/L (ref 22–32)
Calcium: 8.3 mg/dL — ABNORMAL LOW (ref 8.9–10.3)
Chloride: 97 mmol/L — ABNORMAL LOW (ref 98–111)
Creatinine, Ser: 0.72 mg/dL (ref 0.44–1.00)
GFR, Estimated: >60 mL/min (ref >60)
Glucose, Bld: 105 mg/dL — ABNORMAL HIGH (ref 70–99)
Potassium: 4 mmol/L (ref 3.5–5.1)
Sodium: 131 mmol/L — ABNORMAL LOW (ref 135–145)
Total Bilirubin: 0.4 mg/dL (ref 0.0–1.2)
Total Protein: 5.1 g/dL — ABNORMAL LOW (ref 6.5–8.1)

## 2023-12-24 LAB — CBC WITH DIFFERENTIAL/PLATELET
Abs Immature Granulocytes: 0.11 K/uL — ABNORMAL HIGH (ref 0.00–0.07)
Basophils Absolute: 0 K/uL (ref 0.0–0.1)
Basophils Relative: 0 %
Eosinophils Absolute: 0 K/uL (ref 0.0–0.5)
Eosinophils Relative: 0 %
HCT: 30 % — ABNORMAL LOW (ref 36.0–46.0)
Hemoglobin: 9.5 g/dL — ABNORMAL LOW (ref 12.0–15.0)
Immature Granulocytes: 2 %
Lymphocytes Relative: 26 %
Lymphs Abs: 1.8 K/uL (ref 0.7–4.0)
MCH: 28.4 pg (ref 26.0–34.0)
MCHC: 31.7 g/dL (ref 30.0–36.0)
MCV: 89.8 fL (ref 80.0–100.0)
Monocytes Absolute: 0.5 K/uL (ref 0.1–1.0)
Monocytes Relative: 7 %
Neutro Abs: 4.7 K/uL (ref 1.7–7.7)
Neutrophils Relative %: 65 %
Platelets: 330 K/uL (ref 150–400)
RBC: 3.34 MIL/uL — ABNORMAL LOW (ref 3.87–5.11)
RDW: 16 % — ABNORMAL HIGH (ref 11.5–15.5)
WBC: 7.1 K/uL (ref 4.0–10.5)
nRBC: 0.3 % — ABNORMAL HIGH (ref 0.0–0.2)

## 2023-12-24 LAB — VANCOMYCIN, TROUGH: Vancomycin Tr: 16 ug/mL (ref 15–20)

## 2023-12-24 LAB — PHOSPHORUS: Phosphorus: 2 mg/dL — ABNORMAL LOW (ref 2.5–4.6)

## 2023-12-24 LAB — MAGNESIUM: Magnesium: 1.6 mg/dL — ABNORMAL LOW (ref 1.7–2.4)

## 2023-12-25 DIAGNOSIS — J159 Unspecified bacterial pneumonia: Secondary | ICD-10-CM

## 2023-12-25 DIAGNOSIS — I10 Essential (primary) hypertension: Secondary | ICD-10-CM | POA: Diagnosis not present

## 2023-12-25 DIAGNOSIS — Y95 Nosocomial condition: Secondary | ICD-10-CM

## 2023-12-25 DIAGNOSIS — J449 Chronic obstructive pulmonary disease, unspecified: Secondary | ICD-10-CM

## 2023-12-25 DIAGNOSIS — R6520 Severe sepsis without septic shock: Secondary | ICD-10-CM

## 2023-12-25 DIAGNOSIS — I5032 Chronic diastolic (congestive) heart failure: Secondary | ICD-10-CM

## 2023-12-25 DIAGNOSIS — J9621 Acute and chronic respiratory failure with hypoxia: Secondary | ICD-10-CM | POA: Diagnosis not present

## 2023-12-25 DIAGNOSIS — G8929 Other chronic pain: Secondary | ICD-10-CM | POA: Diagnosis not present

## 2023-12-25 LAB — VANCOMYCIN, TROUGH: Vancomycin Tr: 18 ug/mL (ref 15–20)

## 2023-12-25 LAB — GLUCOSE, CAPILLARY: Glucose-Capillary: 120 mg/dL — ABNORMAL HIGH (ref 70–99)

## 2023-12-26 DIAGNOSIS — J449 Chronic obstructive pulmonary disease, unspecified: Secondary | ICD-10-CM

## 2023-12-26 DIAGNOSIS — I10 Essential (primary) hypertension: Secondary | ICD-10-CM | POA: Diagnosis not present

## 2023-12-26 DIAGNOSIS — G8929 Other chronic pain: Secondary | ICD-10-CM | POA: Diagnosis not present

## 2023-12-26 DIAGNOSIS — R6520 Severe sepsis without septic shock: Secondary | ICD-10-CM

## 2023-12-26 DIAGNOSIS — I5032 Chronic diastolic (congestive) heart failure: Secondary | ICD-10-CM

## 2023-12-26 DIAGNOSIS — J9621 Acute and chronic respiratory failure with hypoxia: Secondary | ICD-10-CM | POA: Diagnosis not present

## 2023-12-26 DIAGNOSIS — J159 Unspecified bacterial pneumonia: Secondary | ICD-10-CM

## 2023-12-26 DIAGNOSIS — Y95 Nosocomial condition: Secondary | ICD-10-CM

## 2023-12-26 LAB — MAGNESIUM: Magnesium: 1.9 mg/dL (ref 1.7–2.4)

## 2023-12-27 DIAGNOSIS — I5032 Chronic diastolic (congestive) heart failure: Secondary | ICD-10-CM

## 2023-12-27 DIAGNOSIS — J159 Unspecified bacterial pneumonia: Secondary | ICD-10-CM

## 2023-12-27 DIAGNOSIS — J449 Chronic obstructive pulmonary disease, unspecified: Secondary | ICD-10-CM

## 2023-12-27 DIAGNOSIS — I10 Essential (primary) hypertension: Secondary | ICD-10-CM | POA: Diagnosis not present

## 2023-12-27 DIAGNOSIS — G8929 Other chronic pain: Secondary | ICD-10-CM | POA: Diagnosis not present

## 2023-12-27 DIAGNOSIS — J9621 Acute and chronic respiratory failure with hypoxia: Secondary | ICD-10-CM | POA: Diagnosis not present

## 2023-12-27 DIAGNOSIS — R6520 Severe sepsis without septic shock: Secondary | ICD-10-CM

## 2023-12-27 DIAGNOSIS — Y95 Nosocomial condition: Secondary | ICD-10-CM

## 2023-12-28 DIAGNOSIS — R6520 Severe sepsis without septic shock: Secondary | ICD-10-CM

## 2023-12-28 DIAGNOSIS — J9621 Acute and chronic respiratory failure with hypoxia: Secondary | ICD-10-CM | POA: Diagnosis not present

## 2023-12-28 DIAGNOSIS — I5032 Chronic diastolic (congestive) heart failure: Secondary | ICD-10-CM

## 2023-12-28 DIAGNOSIS — I10 Essential (primary) hypertension: Secondary | ICD-10-CM | POA: Diagnosis not present

## 2023-12-28 DIAGNOSIS — J159 Unspecified bacterial pneumonia: Secondary | ICD-10-CM

## 2023-12-28 DIAGNOSIS — J449 Chronic obstructive pulmonary disease, unspecified: Secondary | ICD-10-CM

## 2023-12-28 DIAGNOSIS — Y95 Nosocomial condition: Secondary | ICD-10-CM

## 2023-12-28 DIAGNOSIS — G8929 Other chronic pain: Secondary | ICD-10-CM | POA: Diagnosis not present

## 2023-12-29 DIAGNOSIS — G8929 Other chronic pain: Secondary | ICD-10-CM | POA: Diagnosis not present

## 2023-12-29 DIAGNOSIS — I10 Essential (primary) hypertension: Secondary | ICD-10-CM | POA: Diagnosis not present

## 2023-12-29 DIAGNOSIS — J9621 Acute and chronic respiratory failure with hypoxia: Secondary | ICD-10-CM | POA: Diagnosis not present

## 2023-12-29 NOTE — Progress Notes (Signed)
 PATIENT INFORMATION   Patient Name: [name]  Date of Birth: 10/28/1940  MRN: [id number]   Date of Admission: 12/23/2023  3:10 PM Length of Stay: 6 Length of Stay: 6   Primary Care Physician: Joan JONELLE Single, MD  Attending Physician:TANGHAM-TATA, ESTELA LABOR, NP    SUBJECTIVE   Chief Complaint:  Acute on chronic respiratory failure with hypoxia    Interval Summary: [name] is a 83 y.o. female  with past medical history of chronic hypoxic respiratory failure on 3 L nasal cannula at home, ILD, COPD, pulmonary fibrosis, septic left shoulder/rheumatoid arthritis, chronic diastolic heart failure, diabetes type 2, HLD, Essential HTN, Acquired hypothyroidism.  Patient  admitted to [name] from [name]  from 12/15/2023 to 12/23/2023.  Patient was recently discharged to SNF after undergoing a hemiarthroplasty of the left shoulder on 11/15/2023 due to septic arthritis which was + for MRSA.  Patient presented to [name] ED on 12/15/2023 with SOB from SNF rehab. Patient was diagnosis with severe sepsis & community-acquired pneumonia.  Several courses of antibiotics included Daptomycin , Zosyn , Vancomycin (Stop date of 8/10), Bactrim  prophylaxes MWF, were used during hospital stay. Patient was treated with high dose IV steroids & weaned down to PO prednisone  with plans to taper.  Patient was on HHF [place], weaned down to HF [name] 8 L [name] Patient to follow-up with Pulmonologist Dr. Geronimo outpatient.  Patient was deemed stable to transfer to [name] for continuation of care.    Subjective: Patient seen & examined. Reports better sleep overnight. Continues on 3 L [name]. Plan is to insert PIV & remove PICC due to completion of antibiotics.    OBJECTIVE   Objective  Vital Signs:  BP 125/71   Pulse 71   Temp 97 F (36.1 C)   Resp 16   Ht 5' 4 (1.626 m)   Wt 174 lb 3.2 oz (79 kg)   SpO2 100%   BMI 29.90 kg/m    I/O last 24 Hours:  Intake/Output Summary (Last 24  hours) at 12/29/2023 1415 Last data filed at 12/29/2023 1200 Gross per 24 hour  Intake 1030 ml  Output 0 ml  Net 1030 ml   FiO2 (%):  [33 %] 33 %    Physical Exam General: Patient seen and examined in no distress. HEENT:  Normocephalic atraumatic, PERRLA, EOMs intact, nares  patent, oropharynx is clear  Neck : Supple, no JVD noted.   Cardiovascular : S1-S2 WNL, no murmurs rubs or gallops, SR on tele Lungs: Diminished Abdomen: + BS X4 Quads, soft, Colostomy.  Extremities: no clubbing cyanosis  Neuro:  awake, alert and oriented   LABS:  None   IMAGING:  CXR 12/23/2023:  Persistent bilateral patchy lung opacities.  Minimal change from the prior examination.  Findings are nonspecific but concerning for multifocal pneumonia    ASSESSMENT AND PLAN   [name] is a 83 y.o. female  who was admitted on 12/23/2023 with Acute on chronic respiratory failure with hypoxia [J96.21] .    Assessment/Plan: Acute on chronic respiratory failure with hypoxia/Hx of COPD, ILD, pulmonary Fibrosis home O2 3L [place]:  Presented on HFNC at 8 L, with BiPAP p.r.n. Currently on 3L [name]. Continue on prednisone  taper.  RT/pulmonary Following.   Septic Arthritis: Continue on Bactrim  MWF. Vancomycin  1 gm completed  12/27/2023. Most recent WBC 7.1.    Chronic Diastolic CHF:  ECHO 10/2023 done @ OSH shows EF 60%.  Continues on Lasix  20 mg daily.  Acquired hypothyroidism:  Continue Synthroid  50 mcg daily.   Diabetes types 2:  Blood sugar checks a.c. HS with sliding scale insulin  0-12 units ACHS.   Essential hypertension:  Increased amlodipine  10 mg daily, clonidine  0.1 mg 3 times a day, decreased metoprolol  50 mg b.i.d..   Hyperlipidemia:  Continue on Lipitor 10 mg nightly.   Anemia of chronic disease: Monitor H/H and treat as indicated.   Acute Pain: Tylenol  Prn, decreased Oxycodone  10 mg to 5 mg Q4H PRN, Change Morphine  1 mg IVP Q6H PRN to MS contin  15 mg nightly, Continue on Cymbalta  60 mg daily. Added  Voltaren  gel to Bilateral knee.    Nutrition:  Patient continues on a regular diet thin liquids.  Registered dietitian/speech therapist Following.   Weakness:  PT OT following.   Prophylaxes: DVT-Lovenox  40 mg subQ daily.  GI Protonix  40 mg b.i.d.   Code Status:  Limited Resuscitation    Spent a total of 50 minutes on this encounter. This includes reviewing patient's extensive history, assessment and visit with the patient as well as documentation time. Time spent also includes IDT collaboration.    SIGNATURE   Electronically signed:  JONNIE ESTELA LABOR, NP  12/29/2023 at 2:15 PM EDT

## 2023-12-30 ENCOUNTER — Telehealth: Payer: Self-pay | Admitting: Family Medicine

## 2023-12-30 DIAGNOSIS — G8929 Other chronic pain: Secondary | ICD-10-CM | POA: Diagnosis not present

## 2023-12-30 DIAGNOSIS — I10 Essential (primary) hypertension: Secondary | ICD-10-CM | POA: Diagnosis not present

## 2023-12-30 DIAGNOSIS — J9621 Acute and chronic respiratory failure with hypoxia: Secondary | ICD-10-CM | POA: Diagnosis not present

## 2023-12-30 NOTE — Telephone Encounter (Signed)
 Patient's daughter dropped off document FMLA, to be filled out by provider. Patient requested to send it back via Fax within 7-days. Document is located in providers tray at front office.Please advise at Mobile [phone] (mobile)

## 2023-12-31 LAB — CBC
HCT: 30.1 % — ABNORMAL LOW (ref 36.0–46.0)
Hemoglobin: 9.6 g/dL — ABNORMAL LOW (ref 12.0–15.0)
MCH: 28.6 pg (ref 26.0–34.0)
MCHC: 31.9 g/dL (ref 30.0–36.0)
MCV: 89.6 fL (ref 80.0–100.0)
Platelets: 219 K/uL (ref 150–400)
RBC: 3.36 MIL/uL — ABNORMAL LOW (ref 3.87–5.11)
RDW: 16.5 % — ABNORMAL HIGH (ref 11.5–15.5)
WBC: 7.3 K/uL (ref 4.0–10.5)
nRBC: 0 % (ref 0.0–0.2)

## 2023-12-31 LAB — BASIC METABOLIC PANEL WITH GFR
Anion gap: 8 (ref 5–15)
BUN: 21 mg/dL (ref 8–23)
CO2: 28 mmol/L (ref 22–32)
Calcium: 8.2 mg/dL — ABNORMAL LOW (ref 8.9–10.3)
Chloride: 98 mmol/L (ref 98–111)
Creatinine, Ser: 0.63 mg/dL (ref 0.44–1.00)
GFR, Estimated: >60 mL/min (ref >60)
Glucose, Bld: 127 mg/dL — ABNORMAL HIGH (ref 70–99)
Potassium: 3.9 mmol/L (ref 3.5–5.1)
Sodium: 134 mmol/L — ABNORMAL LOW (ref 135–145)

## 2023-12-31 LAB — MAGNESIUM: Magnesium: 1.6 mg/dL — ABNORMAL LOW (ref 1.7–2.4)

## 2023-12-31 LAB — TSH: TSH: 0.782 u[IU]/mL (ref 0.350–4.500)

## 2023-12-31 NOTE — Telephone Encounter (Signed)
 As pt still in hospital, pt's daughter should have her providers there complete the form.

## 2024-01-01 LAB — MAGNESIUM: Magnesium: 1.8 mg/dL (ref 1.7–2.4)

## 2024-01-01 NOTE — Telephone Encounter (Signed)
 Called and left a detailed VM about FMLA paperwork

## 2024-01-02 LAB — COMPREHENSIVE METABOLIC PANEL WITH GFR
ALT: 17 U/L (ref 0–44)
AST: 16 U/L (ref 15–41)
Albumin: 2.2 g/dL — ABNORMAL LOW (ref 3.5–5.0)
Alkaline Phosphatase: 67 U/L (ref 38–126)
Anion gap: 9 (ref 5–15)
BUN: 20 mg/dL (ref 8–23)
CO2: 29 mmol/L (ref 22–32)
Calcium: 7.7 mg/dL — ABNORMAL LOW (ref 8.9–10.3)
Chloride: 96 mmol/L — ABNORMAL LOW (ref 98–111)
Creatinine, Ser: 0.59 mg/dL (ref 0.44–1.00)
GFR, Estimated: >60 mL/min (ref >60)
Glucose, Bld: 174 mg/dL — ABNORMAL HIGH (ref 70–99)
Potassium: 3.9 mmol/L (ref 3.5–5.1)
Sodium: 134 mmol/L — ABNORMAL LOW (ref 135–145)
Total Bilirubin: 0.4 mg/dL (ref 0.0–1.2)
Total Protein: 4.5 g/dL — ABNORMAL LOW (ref 6.5–8.1)

## 2024-01-02 LAB — CBC
HCT: 29 % — ABNORMAL LOW (ref 36.0–46.0)
Hemoglobin: 9.1 g/dL — ABNORMAL LOW (ref 12.0–15.0)
MCH: 28.4 pg (ref 26.0–34.0)
MCHC: 31.4 g/dL (ref 30.0–36.0)
MCV: 90.6 fL (ref 80.0–100.0)
Platelets: 152 K/uL (ref 150–400)
RBC: 3.2 MIL/uL — ABNORMAL LOW (ref 3.87–5.11)
RDW: 16.9 % — ABNORMAL HIGH (ref 11.5–15.5)
WBC: 6.6 K/uL (ref 4.0–10.5)
nRBC: 0 % (ref 0.0–0.2)

## 2024-01-02 LAB — MAGNESIUM: Magnesium: 1.7 mg/dL (ref 1.7–2.4)

## 2024-01-02 NOTE — Progress Notes (Deleted)
   Subjective:    Patient ID: [name], female    DOB: 06/20/1940, 83 y.o.   MRN: [id number]  HPI    Review of Systems     Objective:   Physical Exam        Assessment & Plan:

## 2024-01-02 NOTE — Progress Notes (Addendum)
 PATIENT INFORMATION   Patient Name: [name]  Date of Birth: 02-23-1941  MRN: [id number]   Date of Admission: 12/23/2023  3:10 PM Length of Stay: 10 Length of Stay: 10   Primary Care Physician: Clotilda JONELLE Single, MD  Attending Physician:KALOMBO, ROGUE SAILOR, NP    SUBJECTIVE   Chief Complaint:  Acute on chronic respiratory failure with hypoxia    Interval Summary: [name] is a 83 y.o. female  with past medical history of chronic hypoxic respiratory failure on 3 L nasal cannula at home, ILD, COPD, pulmonary fibrosis, septic left shoulder/rheumatoid arthritis, chronic diastolic heart failure, diabetes type 2, HLD, Essential HTN, Acquired hypothyroidism.   Patient  admitted to [facility] from [place] where she stayed from 12/15/2023 to 12/23/2023.  The admitting diagnosis was septic arthritis.  Patient was recently discharged to SNF after undergoing a hemiarthroplasty of the left shoulder on 11/15/2023 .  Patient was found to be  + for MRSA.  Patient presented to [name] ED on 12/15/2023 with SOB from SNF rehab. Patient was diagnosis with severe sepsis & community-acquired pneumonia.  Patient received several courses of antibiotics included Daptomycin , Zosyn , Vancomycin (Stop date of 8/10), Bactrim  prophylaxes MWF, were used during hospital stay. Patient was treated with high dose IV steroids & weaned down to PO prednisone  with plans to taper.  Patient was on HHF [place], weaned down to HF [place] 8 L [place] Patient to follow-up with Pulmonologist Dr. Geronimo outpatient.  On 12/23/2023, patient was deemed stable to transfer to [facility] for [facility] of care.    Subjective: Patient seen & examined.  Patient has no complaints today.  States she had a good night of sleep. OBJECTIVE   Objective  Vital Signs:  BP 117/67   Pulse 73   Temp 97 F (36.1 C) (Axillary)   Resp 17   Ht 5' 4 (1.626 m)   Wt 178 lb (80.7 kg)   SpO2 95%   BMI 30.55 kg/m    I/O last 24  Hours:  Intake/Output Summary (Last 24 hours) at 01/02/2024 0910 Last data filed at 01/02/2024 0851 Gross per 24 hour  Intake 600 ml  Output 1150 ml  Net -550 ml   FiO2 (%):  [32 %] 32 %    Physical Exam General: Patient seen and examined in no distress. HEENT:  Normocephalic atraumatic, PERRLA, EOMs intact, nares  patent, oropharynx is clear  Neck : Supple, no JVD noted.  No adenopathy.   Cardiovascular : S1-S2 WNL, no murmurs rubs or gallops, SR on tele Lungs:  Respirations even and unlabored.  Lung sounds are clear but diminished at bilateral bases.   Abdomen: + BS X4 Quads, soft, Colostomy.  Extremities:  No edema, no clubbing cyanosis  Neuro:  awake, alert and oriented.  Follows command.  Able to move all extremities.   LABS:   Latest Reference Range & Units 01/01/24 04:19  Magnesium  1.7 - 2.4 mg/dL 1.8    IMAGING:  CXR 05/21/7972:  Persistent bilateral patchy lung opacities.  Minimal change from the prior examination.  Findings are nonspecific but concerning for multifocal pneumonia    ASSESSMENT AND PLAN  Acute on chronic respiratory failure with hypoxia/Hx of COPD, ILD, pulmonary Fibrosis home O2 3L [place]:  Presented on HFNC at 8 L, with BiPAP p.r.n. Currently on 3L [place] [address] which is patient's baseline at home..  Maintaining good oxygen  saturation.  Continue on prednisone  taper.  RT/pulmonary Following.   Septic Arthritis:  Patient developed septic arthritis  after arthroplasty.  Continue on Bactrim  MWF for prophylaxis.  Completed Vancomycin  on  12/27/2023. Most recent WBC 7.3.    Chronic Diastolic CHF:  ECHO 10/2023 done @ OSH shows EF 60%.  Patient appears to be euvolemic.  Monitor volume status/weight.  Continues on Lasix  20 mg daily.   Acquired hypothyroidism:  We will obtain repeat TSH.  Continue Synthroid  50 mcg daily.   Diabetes types 2:  Blood sugar checks a.c. HS with sliding scale insulin  0-12 units ACHS.   Essential hypertension:  Blood pressure 108/54.  Continues with  amlodipine  10 mg daily, clonidine  0.1 mg 3 times a day,  metoprolol  50 mg b.i.d..  Monitor vital signs closely.  Hypomagnesemia.  Patient's latest magnesium  1.8.  Replace as needed per electrolyte replacement protocol.     Hyperlipidemia:  Continue on Lipitor 10 mg nightly.   Anemia of chronic disease: Monitor H/H and treat as indicated.   Acute Pain:  Pain seems to be fairly well controlled.  Continue Tylenol , oxycodone , MS Contin , and Voltaren  gel.     Nutrition:  Patient continues on a regular diet thin liquids.  Registered dietitian/speech therapist Following.   Weakness:  PT OT following.   Prophylaxes: DVT-Lovenox  40 mg subQ daily.  GI Protonix  40 mg b.i.d.   Code Status:  Limited Resuscitation    Spent a total of 50 minutes on this encounter. This includes reviewing patient's extensive history, assessment and visit with the patient as well as documentation time. Time spent also includes IDT collaboration.    SIGNATURE   Electronically signed:  RAINA ROGUE SAILOR, NP  01/02/2024 at 9:10 AM EDT

## 2024-01-03 NOTE — Progress Notes (Signed)
 Respiratory Progress Note Room #:5E05  Admit Date:12/23/2023  Pulmonologist:  Discharge Date:  DX/HX:CAP primary, SOB,AKI,COPD, Sever  Isolation:  Code Status:FULL   Airway Type? (Trach/ETT):  Size:  Cuffed/Cuffless?:  Placement at lip?:  High Risk Airway?:  Date of insertion:  Trach changed:  PMV started:  Cap started:  Decannulated/extubated:   Ventilator ID Number: Current ventilator settings:  Current weaning orders:  Date of first wean:  Date of liberation from vent:   Non-invasive Ventilation Current settings: BIPAP 14/8 40%  Home use? Yes/no:YES, 3 LNC  Frequency? HS/PRN/Continuous:PRN    Oxygen  Therapy Modality? Nasal Cannula/NRB/Trach Collar/etc.: Nasal Cannula  FiO2:  LPM: 3LPM   Treatments Drug: Dose: Frequency: Other:                        RT Progress Date Time Important Event or Update RT Initials  12/23/2023 1751 New admit, pt on 8lpm Oxymiser. Bipap ordered prn, pt  has not been using and does not use at home.  CMG  12/24/2023 0544 TITRATED TO 4/LPM [place] SAP  12/24/2023 1853 Pt weaned to home baseline 3lpm [place], BIPAP on stdby CMG  12/25/2023  0603 3/LPM [place]. DID NOT REQUIRE BIPAP SAP  12/25/23 1715 3 LPM N/C EA  12/26/2023 0459 3L [place] KN  12/26/2023 1900 3 LNC JM  12/27/2023 0542 3L [place] KN  12/27/2023 1828 3 LNC JM  12/28/2023 0448 3LNC KN  12/28/2023 1819 3 LNC, HOME O2 JM  12/29/2023 0125 3L [place]; BiPAP on standby at bedside DS  12/29/2023 1405 3L [place]; Bipap on standby (not needed) LN  12/30/23 1705 3 lpm [place] EA  12/31/2023 1809 3L [place] baseline setting HL  01/01/2024 0017 3L [employer] baseline LLP  01/01/2024 1813 3L [place] baseline LK   01/02/2024 0522 3L [place], tried on BIPAP; unable to tolerate OO  01/02/2024 0426 3L [place], refuses BIPAP last night OO  01/03/2024 1850 3L , no distress CD

## 2024-01-03 NOTE — Progress Notes (Signed)
 PATIENT INFORMATION   Patient Name: [name]  Date of Birth: 01/05/1941  MRN: [id number]   Date of Admission: 12/23/2023  3:10 PM Length of Stay: 11 Length of Stay: 11   Primary Care Physician: Clotilda JONELLE Single, MD  Attending Physician:KALOMBO, ROGUE SAILOR, NP    SUBJECTIVE   Chief Complaint:  Acute on chronic respiratory failure with hypoxia    Interval Summary: [name] is a 83 y.o. female  with past medical history of chronic hypoxic respiratory failure on 3 L nasal cannula at home, ILD, COPD, pulmonary fibrosis, septic left shoulder/rheumatoid arthritis, chronic diastolic heart failure, diabetes type 2, HLD, Essential HTN, Acquired hypothyroidism.   Patient  admitted to [facility] from [place] where she stayed from 12/15/2023 to 12/23/2023.  The admitting diagnosis was septic arthritis.  Patient was recently discharged to SNF after undergoing a hemiarthroplasty of the left shoulder on 11/15/2023 .  Patient was found to be  + for MRSA.  Patient presented to [name] ED on 12/15/2023 with SOB from SNF rehab. Patient was diagnosis with severe sepsis & community-acquired pneumonia.  Patient received several courses of antibiotics included Daptomycin , Zosyn , Vancomycin (Stop date of 8/10), Bactrim  prophylaxes MWF, were used during hospital stay. Patient was treated with high dose IV steroids & weaned down to PO prednisone  with plans to taper.  Patient was on HHF [place], weaned down to HF [place] 8 L [place] Patient to follow-up with Pulmonologist Dr. Geronimo outpatient.  On 12/23/2023, patient was deemed stable to transfer to [facility] for continuation of care.    Subjective: Patient seen & examined.  Patient has no complaints today.  Patient complained of chest pain last night but declines any pain this morning.   OBJECTIVE   Objective  Vital Signs:  BP (!) 152/79   Pulse 79   Temp 96.6 F (35.9 C) (Axillary)   Resp 18   Ht 5' 4 (1.626 m)   Wt 180 lb 1.6 oz (81.7 kg)    SpO2 98%   BMI 30.91 kg/m    I/O last 24 Hours:  Intake/Output Summary (Last 24 hours) at 01/03/2024 0948 Last data filed at 01/03/2024 9166 Gross per 24 hour  Intake 960 ml  Output 1450 ml  Net -490 ml   FiO2 (%):  [32 %] 32 %    Physical Exam General: Patient seen and examined in no distress. HEENT:  Normocephalic atraumatic, PERRLA, EOMs intact, nares  patent, oropharynx is clear  Neck : Supple, no JVD noted.  No adenopathy.   Cardiovascular : S1-S2 WNL, no murmurs rubs or gallops, SR on tele Lungs:  Respirations even and unlabored.  Lung sounds are clear but diminished at bilateral bases.   Abdomen: + BS X4 Quads, soft, Colostomy.  Extremities:  No edema, no clubbing cyanosis  Neuro:  awake, alert and oriented.  Follows command.  Able to move all extremities.   LABS:   Latest Reference Range & Units 01/02/24 23:08  Comprehensive metabolic panel with GFR  Rpt !  Sodium 135 - 145 mmol/L 134 (L)  Potassium 3.5 - 5.1 mmol/L 3.9  Chloride 98 - 111 mmol/L 96 (L)  CO2 22 - 32 mmol/L 29  Glucose 70 - 99 mg/dL 825 (H)  BUN 8 - 23 mg/dL 20  Creatinine 9.55 - 8.99 mg/dL 9.40  Calcium  8.9 - 10.3 mg/dL 7.7 (L)  Anion gap 5 - 15  9  Magnesium  1.7 - 2.4 mg/dL 1.7  Alkaline Phosphatase 38 - 126 U/L  67  Albumin  3.5 - 5.0 g/dL 2.2 (L)  AST 15 - 41 U/L 16  ALT 0 - 44 U/L 17  Total Protein 6.5 - 8.1 g/dL 4.5 (L)  Total Bilirubin 0.0 - 1.2 mg/dL 0.4  GFR, Estimated >39 mL/min >60  WBC 4.0 - 10.5 K/uL 6.6  RBC 3.87 - 5.11 MIL/uL 3.20 (L)  Hemoglobin 12.0 - 15.0 g/dL 9.1 (L)  HCT 63.9 - 53.9 % 29.0 (L)  MCV 80.0 - 100.0 fL 90.6  MCH 26.0 - 34.0 pg 28.4  MCHC 30.0 - 36.0 g/dL 68.5  RDW 88.4 - 84.4 % 16.9 (H)  Platelets 150 - 400 K/uL 152  nRBC 0.0 - 0.2 % 0.0  !: Data is abnormal (L): Data is abnormally low (H): Data is abnormally high Rpt: View report in Results Review for more information   IMAGING:  CXR 12/23/2023:  Persistent bilateral patchy lung opacities.  Minimal  change from the prior examination.  Findings are nonspecific but concerning for multifocal pneumonia    ASSESSMENT AND PLAN  Acute on chronic respiratory failure with hypoxia/Hx of COPD, ILD, pulmonary Fibrosis home O2 3L [place]:  Presented on HFNC at 8 L, with BiPAP p.r.n. Currently on 3L [place] which is patient's baseline at home..  Maintaining good oxygen  saturation.  Continue on prednisone  taper.  RT/pulmonary Following.   Septic Arthritis:  Patient developed septic arthritis after arthroplasty.  Continue on Bactrim  MWF for prophylaxis.  Completed Vancomycin  on  12/27/2023. Most recent WBC is within normal limits 6.6.    Chronic Diastolic CHF:  ECHO 10/2023 done @ OSH shows EF 60%.  Patient appears to be euvolemic.  Monitor volume status/weight.  Continues on Lasix  20 mg daily.   Acquired hypothyroidism:  We will obtain repeat TSH.  Continue Synthroid  50 mcg daily.   Diabetes types 2:  Continue Blood sugar checks a.c. HS with sliding scale insulin  0-12 units ACHS.   Essential hypertension:  Blood pressure 152/79.  Continues with amlodipine  10 mg daily, clonidine  0.1 mg 3 times a day,  metoprolol  50 mg b.i.d..  Monitor vital signs closely.  Chest pain, resolved.  Patient complained of chest pain the night of 01/02/2024.  EKG was unremarkable.  Patient denies any current chest pain.  Hypomagnesemia.  Patient's latest magnesium  1.7.  Replace as needed per electrolyte replacement protocol.     Hyperlipidemia:  Continue on Lipitor 10 mg nightly.   Anemia of chronic disease: Monitor H/H and treat as indicated.   Acute Pain:  Pain seems to be fairly well controlled.  Continue Tylenol , oxycodone , MS Contin , and Voltaren  gel.     Nutrition:  Patient continues on a regular diet thin liquids.  Registered dietitian/speech therapist Following.   Weakness:  PT OT following.   Prophylaxes: DVT-Lovenox  40 mg subQ daily.  GI Protonix  40 mg b.i.d.   Code Status:  Limited Resuscitation    Spent a total  of 50 minutes on this encounter. This includes reviewing patient's extensive history, assessment and visit with the patient as well as documentation time. Time spent also includes IDT collaboration.    SIGNATURE   Electronically signed:  RAINA ROGUE SAILOR, NP  01/03/2024 at 9:48 AM EDT

## 2024-01-03 NOTE — Progress Notes (Signed)
 Pt. Refuses to wear BIPAP unit. Can not tolerate.

## 2024-01-04 ENCOUNTER — Ambulatory Visit: Admitting: Infectious Disease

## 2024-01-04 DIAGNOSIS — B9562 Methicillin resistant Staphylococcus aureus infection as the cause of diseases classified elsewhere: Secondary | ICD-10-CM

## 2024-01-04 DIAGNOSIS — M05741 Rheumatoid arthritis with rheumatoid factor of right hand without organ or systems involvement: Secondary | ICD-10-CM

## 2024-01-04 DIAGNOSIS — E785 Hyperlipidemia, unspecified: Secondary | ICD-10-CM

## 2024-01-04 DIAGNOSIS — M00012 Staphylococcal arthritis, left shoulder: Secondary | ICD-10-CM

## 2024-01-04 LAB — CBC
HCT: 33.1 % — ABNORMAL LOW (ref 36.0–46.0)
Hemoglobin: 10.3 g/dL — ABNORMAL LOW (ref 12.0–15.0)
MCH: 28.9 pg (ref 26.0–34.0)
MCHC: 31.1 g/dL (ref 30.0–36.0)
MCV: 93 fL (ref 80.0–100.0)
Platelets: 132 K/uL — ABNORMAL LOW (ref 150–400)
RBC: 3.56 MIL/uL — ABNORMAL LOW (ref 3.87–5.11)
RDW: 16.9 % — ABNORMAL HIGH (ref 11.5–15.5)
WBC: 8.7 K/uL (ref 4.0–10.5)
nRBC: 0 % (ref 0.0–0.2)

## 2024-01-04 LAB — BASIC METABOLIC PANEL WITH GFR
Anion gap: 11 (ref 5–15)
BUN: 19 mg/dL (ref 8–23)
CO2: 29 mmol/L (ref 22–32)
Calcium: 8.3 mg/dL — ABNORMAL LOW (ref 8.9–10.3)
Chloride: 94 mmol/L — ABNORMAL LOW (ref 98–111)
Creatinine, Ser: 0.5 mg/dL (ref 0.44–1.00)
GFR, Estimated: >60 mL/min (ref >60)
Glucose, Bld: 98 mg/dL (ref 70–99)
Potassium: 3.2 mmol/L — ABNORMAL LOW (ref 3.5–5.1)
Sodium: 134 mmol/L — ABNORMAL LOW (ref 135–145)

## 2024-01-04 LAB — MAGNESIUM: Magnesium: 1.7 mg/dL (ref 1.7–2.4)

## 2024-01-04 NOTE — Progress Notes (Signed)
 Respiratory Progress Note Room #:5E05  Admit Date:12/23/2023  Pulmonologist:  Discharge Date:  DX/HX:CAP primary, SOB,AKI,COPD, Sever  Isolation:  Code Status:FULL   Airway Type? (Trach/ETT):  Size:  Cuffed/Cuffless?:  Placement at lip?:  High Risk Airway?:  Date of insertion:  Trach changed:  PMV started:  Cap started:  Decannulated/extubated:   Ventilator ID Number: Current ventilator settings:  Current weaning orders:  Date of first wean:  Date of liberation from vent:   Non-invasive Ventilation Current settings: BIPAP 14/8 40%  Home use? Yes/no:YES, 3 LNC  Frequency? HS/PRN/Continuous:PRN    Oxygen  Therapy Modality? Nasal Cannula/NRB/Trach Collar/etc.: Nasal Cannula  FiO2:  LPM: 3LPM   Treatments Drug: Dose: Frequency: Other:                        RT Progress Date Time Important Event or Update RT Initials  12/23/2023 1751 New admit, pt on 8lpm Oxymiser. Bipap ordered prn, pt  has not been using and does not use at home.  CMG  12/24/2023 0544 TITRATED TO 4/LPM [place] SAP  12/24/2023 1853 Pt weaned to home baseline 3lpm [place], BIPAP on stdby CMG  12/25/2023  0603 3/LPM [place]. DID NOT REQUIRE BIPAP SAP  12/25/23 1715 3 LPM N/C EA  12/26/2023 0459 3L [place] KN  12/26/2023 1900 3 LNC JM  12/27/2023 0542 3L [place] KN  12/27/2023 1828 3 LNC JM  12/28/2023 0448 3LNC KN  12/28/2023 1819 3 LNC, HOME O2 JM  12/29/2023 0125 3L [place]; BiPAP on standby at bedside DS  12/29/2023 1405 3L [place]; Bipap on standby (not needed) LN  12/30/23 1705 3 lpm [place] EA  12/31/2023 1809 3L [place] baseline setting HL  01/01/2024 0017 3L [place] baseline LLP  01/01/2024 1813 3L [place] baseline LK   01/02/2024 0522 3L [place], tried on BIPAP; unable to tolerate OO  01/02/2024 0426 3L [place], refuses BIPAP last night OO  01/03/2024 1850 3L [place], no distress CD  01/04/2024 0452 3L [place]. Refuses BIPAP OO

## 2024-01-05 LAB — MAGNESIUM: Magnesium: 1.5 mg/dL — ABNORMAL LOW (ref 1.7–2.4)

## 2024-01-05 LAB — POTASSIUM: Potassium: 3.4 mmol/L — ABNORMAL LOW (ref 3.5–5.1)

## 2024-01-05 NOTE — Progress Notes (Signed)
 PATIENT INFORMATION   Patient Name: [name]  Date of Birth: 09-05-1940  MRN: [id number]   Date of Admission: 12/23/2023  3:10 PM Length of Stay: 13 Length of Stay: 13   Primary Care Physician: Clotilda JONELLE Single, MD  Attending Physician:KALOMBO, ROGUE SAILOR, NP    SUBJECTIVE   Chief Complaint:  Acute on chronic respiratory failure with hypoxia    Interval Summary: [name] is a 83 y.o. female  with past medical history of chronic hypoxic respiratory failure on 3 L nasal cannula at home, ILD, COPD, pulmonary fibrosis, septic left shoulder/rheumatoid arthritis, chronic diastolic heart failure, diabetes type 2, HLD, Essential HTN, Acquired hypothyroidism.   Patient  admitted to [facility] from [place] where she stayed from 12/15/2023 to 12/23/2023.  The admitting diagnosis was septic arthritis.  Patient was recently discharged to SNF after undergoing a hemiarthroplasty of the left shoulder on 11/15/2023 .  Patient was found to be  + for MRSA.  Patient presented to [name] ED on 12/15/2023 with SOB from SNF rehab. Patient was diagnosis with severe sepsis & community-acquired pneumonia.  Patient received several courses of antibiotics included Daptomycin , Zosyn , Vancomycin (Stop date of 8/10), Bactrim  prophylaxes MWF, were used during hospital stay. Patient was treated with high dose IV steroids & weaned down to PO prednisone  with plans to taper.  Patient was on HHF [place], weaned down to HF [place] 8 L [place] Patient to follow-up with Pulmonologist Dr. Geronimo outpatient.  On 12/23/2023, patient was deemed stable to transfer [facility] for [facility] care.    Subjective: Patient seen & examined.  Patient has no complaints today.  OBJECTIVE   Objective  Vital Signs:  BP 114/49   Pulse 69   Temp 97.3 F (36.3 C) (Axillary)   Resp 14   Ht 5' 4 (1.626 m)   Wt 180 lb 1.6 oz (81.7 kg)   SpO2 98%   BMI 30.91 kg/m    I/O last 24 Hours:  Intake/Output Summary (Last  24 hours) at 01/05/2024 0746 Last data filed at 01/04/2024 2000 Gross per 24 hour  Intake 840 ml  Output 720 ml  Net 120 ml        Physical Exam General: Patient seen and examined in no distress. HEENT:  Normocephalic atraumatic, PERRLA, EOMs intact, nares  patent, oropharynx is clear  Neck : Supple, no JVD noted.  No adenopathy.   Cardiovascular : S1-S2 WNL, no murmurs rubs or gallops, SR on tele Lungs:  Respirations even and unlabored.  Lung sounds are clear but diminished at bilateral bases.   Abdomen: + BS X4 Quads, soft, Colostomy.  Extremities:  No edema, no clubbing cyanosis  Neuro:  awake, alert and oriented.  Follows command.  Able to move all extremities.   LABS:   Latest Reference Range & Units 01/04/24 12:41  Basic metabolic panel with GFR  Rpt !  Sodium 135 - 145 mmol/L 134 (L)  Potassium 3.5 - 5.1 mmol/L 3.2 (L)  Chloride 98 - 111 mmol/L 94 (L)  CO2 22 - 32 mmol/L 29  Glucose 70 - 99 mg/dL 98  BUN 8 - 23 mg/dL 19  Creatinine 9.55 - 8.99 mg/dL 9.49  Calcium  8.9 - 10.3 mg/dL 8.3 (L)  Anion gap 5 - 15  11  Magnesium  1.7 - 2.4 mg/dL 1.7  GFR, Estimated >39 mL/min >60  !: Data is abnormal (L): Data is abnormally low Rpt: View report in Results Review for more information   IMAGING:  CXR 12/23/2023:  Persistent bilateral patchy lung opacities.  Minimal change from the prior examination.  Findings are nonspecific but concerning for multifocal pneumonia    ASSESSMENT AND PLAN  Acute on chronic respiratory failure with hypoxia/Hx of COPD, ILD, pulmonary Fibrosis home O2 3L [place]:  Presented on HFNC at 8 L, with BiPAP p.r.n. Currently back to her baseline of 3L [place] . Maintaining good oxygen  saturation.  Continue on prednisone  taper.  RT/pulmonary Following.   Septic Arthritis:  Patient developed septic arthritis after arthroplasty.  Continue on Bactrim  MWF for prophylaxis.  Completed Vancomycin  on  12/27/2023. Most recent WBC is within normal limits 8.7.  Repeat labs  pending.   Chronic Diastolic CHF:  ECHO 10/2023 done @ OSH shows EF 60%.  Patient appears to be euvolemic.  Monitor volume status/weight.  Continues on Lasix  20 mg daily.   Acquired hypothyroidism:    Latest TSH 0.782.  Continue Synthroid  50 mcg daily.   Diabetes types 2:  Continue Blood sugar checks a.c. HS with sliding scale insulin  0-12 units ACHS.   Essential hypertension:  Blood pressure 114/49.  Continues with amlodipine  10 mg daily, clonidine  0.1 mg 3 times a day,  metoprolol  50 mg b.i.d..  Monitor vital signs closely.  Chest pain, resolved.  Patient complained of chest pain the night of 01/02/2024.  EKG was unremarkable.  Patient denies any current chest pain.  Electrolytes Imbalance.  Patient's latest magnesium  1.7.  Latest potassium 3.2.  Replace as needed per electrolyte replacement protocol.  Repeat labs pending.   Hyperlipidemia:  Continue on Lipitor 10 mg nightly.   Anemia of chronic disease: Monitor H/H and treat as indicated.   Acute Pain:  Pain seems to be fairly well controlled.  Continue Tylenol , oxycodone , MS Contin , and Voltaren  gel.     Nutrition:  Patient continues on a regular diet thin liquids.  Registered dietitian/speech therapist Following.   Weakness:  PT OT following.   Prophylaxes: DVT-Lovenox  40 mg subQ daily.  GI Protonix  40 mg b.i.d.   Code Status:  Limited Resuscitation    Spent a total of 50 minutes on this encounter. This includes reviewing patient's extensive history, assessment and visit with the patient as well as documentation time. Time spent also includes IDT collaboration.    SIGNATURE   Electronically signed:  RAINA ROGUE SAILOR, NP  01/05/2024 at 7:46 AM EDT

## 2024-01-06 LAB — MAGNESIUM: Magnesium: 1.7 mg/dL (ref 1.7–2.4)

## 2024-01-06 LAB — POTASSIUM: Potassium: 4.8 mmol/L (ref 3.5–5.1)

## 2024-01-08 ENCOUNTER — Institutional Professional Consult (permissible substitution) (HOSPITAL_BASED_OUTPATIENT_CLINIC_OR_DEPARTMENT_OTHER)

## 2024-01-08 DIAGNOSIS — M7989 Other specified soft tissue disorders: Secondary | ICD-10-CM | POA: Insufficient documentation

## 2024-01-08 DIAGNOSIS — M79622 Pain in left upper arm: Secondary | ICD-10-CM | POA: Insufficient documentation

## 2024-01-08 LAB — LACTIC ACID, PLASMA: Lactic Acid, Venous: 1.3 mmol/L (ref 0.5–1.9)

## 2024-01-08 NOTE — Progress Notes (Signed)
   ORTHOPAEDIC PROGRESS NOTE  s/p Left hemiarthroplasty for a chronic infection 11-15-23  SUBJECTIVE: Reports moderate pain about operative site. This has not worsened. Improved since before surgery. Having pain in elbow as well.   OBJECTIVE: PE: LUE: incision CDI. Tolerates passive ROM to about 90 degrees. TTP distal biceps. Warm well perfused hand.   There were no vitals filed for this visit.  Opiates Today (MME): Today's  total administered Morphine  Milligram Equivalents: 0 Opiates Yesterday (MME): Yesterday's total administered Morphine  Milligram Equivalents: 0 Opiates Used in the last two days:  Inpatient Morphine  Milligram Equivalents Per Day 8/15 - 8/22     No orders with morphine  equivalence       ASSESSMENT: [name] is a 83 y.o. female S/p above. [facility] Her son requested I see her in the hospital.   PLAN: Weightbearing:  - okay to work on ROM of left shoulder as patient tolerates - < 5 pound weight restriciton - no backwards extension or internal rotation Insicional and dressing care: N/A Orthopedic device(s): N/A VTE prophylaxis: per primary team Pain control: per primary team   I did discuss with patient's team that I would like to order doppler of her left upper extremity to evaluate for DVT. I am unable to place the order as we do not have privileges at [facility]   Patient would also benefit from continued PT/OT at discharge.    Follow - up plan: In office in 4 weeks Contact information:  Weekdays 8-5 Dr. Bonner Hair, Aleck Stalling PA-C, After hours and holidays please check [email or web address] for group call information for [facility]   Aleck Stalling, PA-C 01/08/24

## 2024-01-08 NOTE — Progress Notes (Signed)
 VASCULAR LAB    Left upper extremity venous duplex has been performed.  See CV proc for preliminary results.   Teonna Coonan, RVT 01/08/2024, 3:02 PM

## 2024-01-12 LAB — BASIC METABOLIC PANEL WITH GFR
Anion gap: 5 (ref 5–15)
BUN: 13 mg/dL (ref 8–23)
CO2: 30 mmol/L (ref 22–32)
Calcium: 7.7 mg/dL — ABNORMAL LOW (ref 8.9–10.3)
Chloride: 88 mmol/L — ABNORMAL LOW (ref 98–111)
Creatinine, Ser: 0.62 mg/dL (ref 0.44–1.00)
GFR, Estimated: >60 mL/min (ref >60)
Glucose, Bld: 108 mg/dL — ABNORMAL HIGH (ref 70–99)
Potassium: 4.2 mmol/L (ref 3.5–5.1)
Sodium: 123 mmol/L — ABNORMAL LOW (ref 135–145)

## 2024-01-12 LAB — CBC
HCT: 26.9 % — ABNORMAL LOW (ref 36.0–46.0)
Hemoglobin: 8.8 g/dL — ABNORMAL LOW (ref 12.0–15.0)
MCH: 28.9 pg (ref 26.0–34.0)
MCHC: 32.7 g/dL (ref 30.0–36.0)
MCV: 88.2 fL (ref 80.0–100.0)
Platelets: 104 K/uL — ABNORMAL LOW (ref 150–400)
RBC: 3.05 MIL/uL — ABNORMAL LOW (ref 3.87–5.11)
RDW: 16.6 % — ABNORMAL HIGH (ref 11.5–15.5)
WBC: 5 K/uL (ref 4.0–10.5)
nRBC: 0 % (ref 0.0–0.2)

## 2024-01-12 LAB — HEMOGLOBIN A1C
Hgb A1c MFr Bld: 5.7 % — ABNORMAL HIGH (ref 4.8–5.6)
Mean Plasma Glucose: 116.89 mg/dL

## 2024-01-12 LAB — MAGNESIUM: Magnesium: 1.7 mg/dL (ref 1.7–2.4)

## 2024-01-12 LAB — PHOSPHORUS: Phosphorus: 2.4 mg/dL — ABNORMAL LOW (ref 2.5–4.6)

## 2024-01-13 ENCOUNTER — Encounter: Attending: Physical Medicine and Rehabilitation | Admitting: Physical Medicine & Rehabilitation

## 2024-01-13 DIAGNOSIS — M25512 Pain in left shoulder: Secondary | ICD-10-CM | POA: Insufficient documentation

## 2024-01-13 DIAGNOSIS — G894 Chronic pain syndrome: Secondary | ICD-10-CM | POA: Insufficient documentation

## 2024-01-13 DIAGNOSIS — M25511 Pain in right shoulder: Secondary | ICD-10-CM | POA: Insufficient documentation

## 2024-01-13 DIAGNOSIS — M5416 Radiculopathy, lumbar region: Secondary | ICD-10-CM | POA: Insufficient documentation

## 2024-01-13 DIAGNOSIS — Z5181 Encounter for therapeutic drug level monitoring: Secondary | ICD-10-CM | POA: Insufficient documentation

## 2024-01-13 DIAGNOSIS — M7061 Trochanteric bursitis, right hip: Secondary | ICD-10-CM | POA: Insufficient documentation

## 2024-01-13 DIAGNOSIS — M170 Bilateral primary osteoarthritis of knee: Secondary | ICD-10-CM | POA: Insufficient documentation

## 2024-01-13 DIAGNOSIS — Z79891 Long term (current) use of opiate analgesic: Secondary | ICD-10-CM | POA: Insufficient documentation

## 2024-01-13 DIAGNOSIS — G8929 Other chronic pain: Secondary | ICD-10-CM | POA: Insufficient documentation

## 2024-01-13 LAB — BASIC METABOLIC PANEL WITH GFR
Anion gap: 10 (ref 5–15)
BUN: 13 mg/dL (ref 8–23)
CO2: 26 mmol/L (ref 22–32)
Calcium: 7.6 mg/dL — ABNORMAL LOW (ref 8.9–10.3)
Chloride: 87 mmol/L — ABNORMAL LOW (ref 98–111)
Creatinine, Ser: 0.61 mg/dL (ref 0.44–1.00)
GFR, Estimated: >60 mL/min (ref >60)
Glucose, Bld: 118 mg/dL — ABNORMAL HIGH (ref 70–99)
Potassium: 4.2 mmol/L (ref 3.5–5.1)
Sodium: 123 mmol/L — ABNORMAL LOW (ref 135–145)

## 2024-01-15 ENCOUNTER — Ambulatory Visit: Admitting: Family Medicine

## 2024-01-16 DIAGNOSIS — Z9181 History of falling: Secondary | ICD-10-CM | POA: Diagnosis not present

## 2024-01-16 DIAGNOSIS — R32 Unspecified urinary incontinence: Secondary | ICD-10-CM | POA: Diagnosis not present

## 2024-01-16 DIAGNOSIS — E119 Type 2 diabetes mellitus without complications: Secondary | ICD-10-CM | POA: Diagnosis not present

## 2024-01-16 DIAGNOSIS — L89622 Pressure ulcer of left heel, stage 2: Secondary | ICD-10-CM | POA: Diagnosis not present

## 2024-01-16 DIAGNOSIS — J9621 Acute and chronic respiratory failure with hypoxia: Secondary | ICD-10-CM | POA: Diagnosis not present

## 2024-01-16 DIAGNOSIS — N289 Disorder of kidney and ureter, unspecified: Secondary | ICD-10-CM | POA: Diagnosis not present

## 2024-01-16 DIAGNOSIS — L89322 Pressure ulcer of left buttock, stage 2: Secondary | ICD-10-CM | POA: Diagnosis not present

## 2024-01-16 DIAGNOSIS — D649 Anemia, unspecified: Secondary | ICD-10-CM | POA: Diagnosis not present

## 2024-01-16 DIAGNOSIS — I110 Hypertensive heart disease with heart failure: Secondary | ICD-10-CM | POA: Diagnosis not present

## 2024-01-16 DIAGNOSIS — Z79890 Hormone replacement therapy (postmenopausal): Secondary | ICD-10-CM | POA: Diagnosis not present

## 2024-01-16 DIAGNOSIS — T8459XA Infection and inflammatory reaction due to other internal joint prosthesis, initial encounter: Secondary | ICD-10-CM | POA: Diagnosis not present

## 2024-01-16 DIAGNOSIS — E039 Hypothyroidism, unspecified: Secondary | ICD-10-CM | POA: Diagnosis not present

## 2024-01-16 DIAGNOSIS — J8410 Pulmonary fibrosis, unspecified: Secondary | ICD-10-CM | POA: Diagnosis not present

## 2024-01-16 DIAGNOSIS — K649 Unspecified hemorrhoids: Secondary | ICD-10-CM | POA: Diagnosis not present

## 2024-01-16 DIAGNOSIS — Z9981 Dependence on supplemental oxygen: Secondary | ICD-10-CM | POA: Diagnosis not present

## 2024-01-16 DIAGNOSIS — B9562 Methicillin resistant Staphylococcus aureus infection as the cause of diseases classified elsewhere: Secondary | ICD-10-CM | POA: Diagnosis not present

## 2024-01-16 DIAGNOSIS — Z7952 Long term (current) use of systemic steroids: Secondary | ICD-10-CM | POA: Diagnosis not present

## 2024-01-16 DIAGNOSIS — J189 Pneumonia, unspecified organism: Secondary | ICD-10-CM | POA: Diagnosis not present

## 2024-01-16 DIAGNOSIS — M00012 Staphylococcal arthritis, left shoulder: Secondary | ICD-10-CM | POA: Diagnosis not present

## 2024-01-16 DIAGNOSIS — E785 Hyperlipidemia, unspecified: Secondary | ICD-10-CM | POA: Diagnosis not present

## 2024-01-16 DIAGNOSIS — Z933 Colostomy status: Secondary | ICD-10-CM | POA: Diagnosis not present

## 2024-01-16 DIAGNOSIS — Z79899 Other long term (current) drug therapy: Secondary | ICD-10-CM | POA: Diagnosis not present

## 2024-01-16 DIAGNOSIS — I5032 Chronic diastolic (congestive) heart failure: Secondary | ICD-10-CM | POA: Diagnosis not present

## 2024-01-16 DIAGNOSIS — J440 Chronic obstructive pulmonary disease with acute lower respiratory infection: Secondary | ICD-10-CM | POA: Diagnosis not present

## 2024-01-19 ENCOUNTER — Ambulatory Visit: Admitting: Primary Care

## 2024-01-19 DIAGNOSIS — R053 Chronic cough: Secondary | ICD-10-CM

## 2024-01-19 DIAGNOSIS — J849 Interstitial pulmonary disease, unspecified: Secondary | ICD-10-CM

## 2024-01-20 DIAGNOSIS — M00012 Staphylococcal arthritis, left shoulder: Secondary | ICD-10-CM | POA: Diagnosis not present

## 2024-01-20 DIAGNOSIS — J189 Pneumonia, unspecified organism: Secondary | ICD-10-CM | POA: Diagnosis not present

## 2024-01-20 DIAGNOSIS — B9562 Methicillin resistant Staphylococcus aureus infection as the cause of diseases classified elsewhere: Secondary | ICD-10-CM | POA: Diagnosis not present

## 2024-01-20 DIAGNOSIS — J440 Chronic obstructive pulmonary disease with acute lower respiratory infection: Secondary | ICD-10-CM | POA: Diagnosis not present

## 2024-01-20 DIAGNOSIS — T8459XA Infection and inflammatory reaction due to other internal joint prosthesis, initial encounter: Secondary | ICD-10-CM | POA: Diagnosis not present

## 2024-01-20 DIAGNOSIS — J9621 Acute and chronic respiratory failure with hypoxia: Secondary | ICD-10-CM | POA: Diagnosis not present

## 2024-01-25 ENCOUNTER — Inpatient Hospital Stay: Admitting: Family Medicine

## 2024-01-25 DIAGNOSIS — B9562 Methicillin resistant Staphylococcus aureus infection as the cause of diseases classified elsewhere: Secondary | ICD-10-CM | POA: Diagnosis not present

## 2024-01-25 DIAGNOSIS — T8459XA Infection and inflammatory reaction due to other internal joint prosthesis, initial encounter: Secondary | ICD-10-CM | POA: Diagnosis not present

## 2024-01-25 DIAGNOSIS — M00012 Staphylococcal arthritis, left shoulder: Secondary | ICD-10-CM | POA: Diagnosis not present

## 2024-01-25 DIAGNOSIS — J9621 Acute and chronic respiratory failure with hypoxia: Secondary | ICD-10-CM | POA: Diagnosis not present

## 2024-01-25 DIAGNOSIS — J440 Chronic obstructive pulmonary disease with acute lower respiratory infection: Secondary | ICD-10-CM | POA: Diagnosis not present

## 2024-01-25 DIAGNOSIS — J189 Pneumonia, unspecified organism: Secondary | ICD-10-CM | POA: Diagnosis not present

## 2024-01-27 DIAGNOSIS — J440 Chronic obstructive pulmonary disease with acute lower respiratory infection: Secondary | ICD-10-CM | POA: Diagnosis not present

## 2024-01-27 DIAGNOSIS — J189 Pneumonia, unspecified organism: Secondary | ICD-10-CM | POA: Diagnosis not present

## 2024-01-27 DIAGNOSIS — T8459XA Infection and inflammatory reaction due to other internal joint prosthesis, initial encounter: Secondary | ICD-10-CM | POA: Diagnosis not present

## 2024-01-27 DIAGNOSIS — J9621 Acute and chronic respiratory failure with hypoxia: Secondary | ICD-10-CM | POA: Diagnosis not present

## 2024-01-27 DIAGNOSIS — B9562 Methicillin resistant Staphylococcus aureus infection as the cause of diseases classified elsewhere: Secondary | ICD-10-CM | POA: Diagnosis not present

## 2024-01-27 DIAGNOSIS — M00012 Staphylococcal arthritis, left shoulder: Secondary | ICD-10-CM | POA: Diagnosis not present

## 2024-01-28 ENCOUNTER — Telehealth: Payer: Self-pay | Admitting: Registered Nurse

## 2024-01-28 ENCOUNTER — Telehealth: Payer: Self-pay

## 2024-01-28 ENCOUNTER — Encounter: Payer: Self-pay | Admitting: Family Medicine

## 2024-01-28 DIAGNOSIS — T8459XA Infection and inflammatory reaction due to other internal joint prosthesis, initial encounter: Secondary | ICD-10-CM | POA: Diagnosis not present

## 2024-01-28 DIAGNOSIS — B9562 Methicillin resistant Staphylococcus aureus infection as the cause of diseases classified elsewhere: Secondary | ICD-10-CM | POA: Diagnosis not present

## 2024-01-28 DIAGNOSIS — J9621 Acute and chronic respiratory failure with hypoxia: Secondary | ICD-10-CM | POA: Diagnosis not present

## 2024-01-28 DIAGNOSIS — M00012 Staphylococcal arthritis, left shoulder: Secondary | ICD-10-CM | POA: Diagnosis not present

## 2024-01-28 DIAGNOSIS — J440 Chronic obstructive pulmonary disease with acute lower respiratory infection: Secondary | ICD-10-CM | POA: Diagnosis not present

## 2024-01-28 DIAGNOSIS — J189 Pneumonia, unspecified organism: Secondary | ICD-10-CM | POA: Diagnosis not present

## 2024-01-28 NOTE — Telephone Encounter (Signed)
 Patient calling into the office requesting refills on Oxycodone  and Morphine .  Patient states that she only received 10-15 tablets when she got the Morphine  prescription filled and had to take it while admitted in the hospital.  Patient did seem slightly confused with dates and who last prescribed pain medication.  Patient did send a mychart message to the Provider.  I reviewed the message and did ask the patient about how she did take the Morphine .  Patient states that while in the hospital  they made me take my own medication   This message will be sent to Provider for review.

## 2024-01-28 NOTE — Telephone Encounter (Signed)
 Bari,  Call Ms. Perren daughter and ask her about her medication. According to the discharge summary she was suppose to only take the Morphine  at night.  She needs to have a scheduled appointment with our office prior to any medication refill.

## 2024-01-29 ENCOUNTER — Encounter: Payer: Self-pay | Admitting: Registered Nurse

## 2024-01-29 ENCOUNTER — Telehealth: Payer: Self-pay | Admitting: Family Medicine

## 2024-01-29 ENCOUNTER — Encounter: Attending: Physical Medicine and Rehabilitation | Admitting: Registered Nurse

## 2024-01-29 ENCOUNTER — Other Ambulatory Visit: Payer: Self-pay | Admitting: Family Medicine

## 2024-01-29 VITALS — BP 160/81 | HR 95 | Ht 64.0 in | Wt 194.0 lb

## 2024-01-29 DIAGNOSIS — M25512 Pain in left shoulder: Secondary | ICD-10-CM | POA: Insufficient documentation

## 2024-01-29 DIAGNOSIS — G894 Chronic pain syndrome: Secondary | ICD-10-CM | POA: Insufficient documentation

## 2024-01-29 DIAGNOSIS — Z5181 Encounter for therapeutic drug level monitoring: Secondary | ICD-10-CM | POA: Insufficient documentation

## 2024-01-29 DIAGNOSIS — M5416 Radiculopathy, lumbar region: Secondary | ICD-10-CM | POA: Insufficient documentation

## 2024-01-29 DIAGNOSIS — Z79891 Long term (current) use of opiate analgesic: Secondary | ICD-10-CM | POA: Diagnosis not present

## 2024-01-29 DIAGNOSIS — G8929 Other chronic pain: Secondary | ICD-10-CM | POA: Insufficient documentation

## 2024-01-29 DIAGNOSIS — R5381 Other malaise: Secondary | ICD-10-CM | POA: Diagnosis not present

## 2024-01-29 MED ORDER — OXYCODONE-ACETAMINOPHEN 5-325 MG PO TABS: 1.0000 | ORAL_TABLET | Freq: Two times a day (BID) | ORAL | 0 refills | Status: DC | PRN

## 2024-01-29 MED ORDER — MORPHINE SULFATE ER 15 MG PO TBCR: 15.0000 mg | EXTENDED_RELEASE_TABLET | Freq: Every day | ORAL | 0 refills | Status: DC

## 2024-01-29 MED ORDER — INSULIN ASPART 100 UNIT/ML IJ SOLN: 0.0000 [IU] | INTRAMUSCULAR | Status: AC

## 2024-01-29 NOTE — Telephone Encounter (Unsigned)
 Copied from CRM [id number]. Topic: Clinical - Medication Refill >> Jan 29, 2024  1:18 PM Suzen RAMAN wrote: Medication: [employer] Supplies  Has the patient contacted their pharmacy? Yes   This is the patient's preferred pharmacy:   [facility] [place], [place] - [address] [place] [address] Phone: [phone] Fax: [phone]  Is this the correct pharmacy for this prescription? Yes If no, delete pharmacy and type the correct one.   Has the prescription been filled recently? No  Is the patient out of the medication? No  Has the patient been seen for an appointment in the last year OR does the patient have an upcoming appointment? Yes  Can we respond through MyChart? Yes  Agent: Please be advised that Rx refills may take up to 3 business days. We ask that you follow-up with your pharmacy.

## 2024-01-29 NOTE — Telephone Encounter (Signed)
 Copied from CRM #[id number]. Topic: Clinical - Medication Refill >> Jan 29, 2024  1:16 PM Suzen RAMAN wrote: Medication: insulin  aspart (NOVOLOG ) 100 UNIT/ML injection  Has the patient contacted their pharmacy? Yes   This is the patient's preferred pharmacy:  [facility] [phone] - [place], [place] - [address] AT [facility] [address] [place] [address] Phone: [phone] Fax: [phone]  Is this the correct pharmacy for this prescription? Yes If no, delete pharmacy and type the correct one.   Has the prescription been filled recently? No  Is the patient out of the medication? No  Has the patient been seen for an appointment in the last year OR does the patient have an upcoming appointment? Yes  Can we respond through MyChart? Yes  Agent: Please be advised that Rx refills may take up to 3 business days. We ask that you follow-up with your pharmacy.

## 2024-01-29 NOTE — Patient Instructions (Signed)
 Send My Chart message in two weeks with update on Medication change.   Please F/U with Primary Care regarding her Maintenance Medication.   Son states he called yesterday and sent My-Chart message today to PCP.

## 2024-01-29 NOTE — Progress Notes (Signed)
 Subjective:    Patient ID: [name], female    DOB: 1941-03-24, 83 y.o.   MRN: [id number]  HPI: [name] is a 83 y.o. female who is scheduled  for virtual visit, I connected with [name] via Virtual visit  and verified that I am speaking with the correct person using two identifiers.  Location: Patient: In her home Provider: In the office   I discussed the limitations, risks, security and privacy concerns of performing an evaluation and management service by telephone and the availability of in person appointments. I also discussed with the patient that there may be a patient responsible charge related to this service. The patient expressed understanding and agreed to proceed.  She  states her pain is located in  her left shoulder, lower back radiating into herleft hip. She rates her pain 7. Her She is not following a exercise regimen, son reports she is non weight bearing. She has a scheduled appointment with physical therapy. [name] had several hospitalizations since last visit and rehabilitation visit, discussed with her son, she will have to come to office for visit in order to prescribe her analgesics, he verbalizes understanding. He reports [name] is bedridden, he will discussed with her insurance, e discussed PTAR and Palliative care, he verbalizes understanding. He will send a My chart message with his decision.   [name] Morphine  equivalent is 30.00 MME.         Pain Inventory Average Pain 7 Pain Right Now 7 My pain is intermittent and sharp  In the last 24 hours, has pain interfered with the following? General activity 10 Relation with others 9 Enjoyment of life 10 What TIME of day is your pain at its worst? morning , daytime, evening, and night Sleep (in general) Poor  Pain is worse with: some activites and in bed day in night  Pain improves with: nothing Relief from Meds: No pain medication on hand. Tylenol  is not  helping.  Family History  Problem Relation Age of Onset   Diabetes Mother    Heart attack Mother    Hypertension Father    Lung cancer Father    Diabetes Sister    Breast cancer Sister    Breast cancer Sister    Diabetes Brother    Hypertension Brother    Heart disease Brother    Heart attack Brother    Kidney cancer Brother    Gout Brother    Kidney failure Brother        x 5   Esophageal cancer Brother    Stomach cancer Brother    Prostate cancer Brother    Uterine cancer Daughter        with mets   Fibroids Daughter    Hypertension Daughter    Hypertension Daughter    Hypertension Son    Heart murmur Son    Rheum arthritis Maternal Uncle    Alzheimer's disease Neg Hx    Dementia Neg Hx    Social History   Socioeconomic History   Marital status: Married    Spouse name: Not on file   Number of children: 6   Years of education: college   Highest education level: Bachelor's degree (e.g., BA, AB, BS)  Occupational History   Occupation: retired Engineer, civil (consulting)  Tobacco Use   Smoking status: Never   Smokeless tobacco: Never  Vaping Use   Vaping status: Never Used  Substance and Sexual Activity   Alcohol  use: No  Drug use: No   Sexual activity: Not Currently  Other Topics Concern   Not on file  Social History Narrative   Patient consumes 2-3 cups coffee per day.   Social Drivers of Corporate investment banker Strain: Low Risk  (12/24/2023)   Received from [facility]   Overall Financial Resource Strain (CARDIA)    Difficulty of Paying Living Expenses: Not hard at all  Food Insecurity: No Food Insecurity (12/24/2023)   Received from [facility]   Hunger Vital Sign    Within the past 12 months, you worried that your food would run out before you got the money to buy more.: Never true    Within the past 12 months, the food you bought just didn't last and you didn't have money to get more.: Never true  Transportation Needs: No Transportation Needs (01/13/2024)    Received from [facility]   SM SDOH Transportation Source    Has lack of transportation kept you from medical appointments or from getting medications?: No    Has lack of transportation kept you from meetings, work, or from getting things needed for daily living?: No  Physical Activity: Insufficiently Active (10/30/2023)   Exercise Vital Sign    Days of Exercise per Week: 5 days    Minutes of Exercise per Session: 20 min  Stress: Stress Concern Present (01/13/2024)   Received from [facility]   [employer] of Occupational Health - Occupational Stress Questionnaire    Feeling of Stress : To some extent  Social Connections: Moderately Integrated (12/24/2023)   Received from [facility]   Social Connection and Isolation Panel    In a typical week, how many times do you talk on the phone with family, friends, or neighbors?: More than three times a week    How often do you get together with friends or relatives?: More than three times a week    How often do you attend church or religious services?: 1 to 4 times per year    Do you belong to any clubs or organizations such as church groups, unions, fraternal or athletic groups, or school groups?: No    How often do you attend meetings of the clubs or organizations you belong to?: Never    Are you married, widowed, divorced, separated, never married, or living with a partner?: Married   Past Surgical History:  Procedure Laterality Date   BRAIN SURGERY     Gamma knife 10/13. Needs repeat spring  '14   CARDIAC CATHETERIZATION N/A 10/31/2014   Procedure: Right/Left Heart Cath and Coronary Angiography;  Surgeon: Candyce GORMAN Reek, MD;  Location: [facility] INVASIVE CV LAB;  Service: Cardiovascular;  Laterality: N/A;   COLONOSCOPY WITH PROPOFOL  N/A 04/14/2022   Procedure: COLONOSCOPY WITH PROPOFOL ;  Surgeon: Albertus Gordy HERO, MD;  Location: [facility] ENDOSCOPY;  Service: Gastroenterology;  Laterality: N/A;   CYST EXCISION     2 on face   CYST REMOVAL  NECK     ESOPHAGOGASTRODUODENOSCOPY (EGD) WITH PROPOFOL  N/A 09/14/2014   Procedure: ESOPHAGOGASTRODUODENOSCOPY (EGD) WITH PROPOFOL ;  Surgeon: Lamar JONETTA Aho, MD;  Location: [facility] ENDOSCOPY;  Service: Endoscopy;  Laterality: N/A;   INCISION AND DRAINAGE HIP Left 01/16/2017   Procedure: IRRIGATION AND DEBRIDEMENT LEFT HIP;  Surgeon: Vernetta Lonni GRADE, MD;  Location: [facility] ORS;  Service: Orthopedics;  Laterality: Left;   INTRAMEDULLARY (IM) NAIL INTERTROCHANTERIC Left 11/29/2016   Procedure: INTRAMEDULLARY (IM) NAIL INTERTROCHANTRIC;  Surgeon: Vernetta Lonni GRADE, MD;  Location: [facility] OR;  Service:  Orthopedics;  Laterality: Left;   LAPAROSCOPIC LOOP COLOSTOMY N/A 07/17/2023   Procedure: LAPAROSCOPIC DIVERTING LOOP COLOSTOMY;  Surgeon: Belinda Cough, MD;  Location: [facility] OR;  Service: General;  Laterality: N/A;   OVARY SURGERY     RIGHT HEART CATH N/A 08/12/2021   Procedure: RIGHT HEART CATH;  Surgeon: Court Dorn PARAS, MD;  Location: [facility] INVASIVE CV LAB;  Service: Cardiovascular;  Laterality: N/A;   SHOULDER HEMI-ARTHROPLASTY Left 11/15/2023   Procedure: HEMIARTHROPLASTY, SHOULDER;  Surgeon: Cristy Bonner DASEN, MD;  Location: [facility] OR;  Service: Orthopedics;  Laterality: Left;  with Block   SHOULDER SURGERY Left    TONSILLECTOMY  age 63   VAGINAL HYSTERECTOMY     VIDEO BRONCHOSCOPY Bilateral 05/31/2013   Procedure: VIDEO BRONCHOSCOPY WITHOUT FLUORO;  Surgeon: Dorethia Cave, MD;  Location: [facility] ENDOSCOPY;  Service: Cardiopulmonary;  Laterality: Bilateral;   video bronscoscopy  05/19/1998   lung   Past Surgical History:  Procedure Laterality Date   BRAIN SURGERY     Gamma knife 10/13. Needs repeat spring  '14   CARDIAC CATHETERIZATION N/A 10/31/2014   Procedure: Right/Left Heart Cath and Coronary Angiography;  Surgeon: Candyce GORMAN Reek, MD;  Location: [facility] INVASIVE CV LAB;  Service: Cardiovascular;  Laterality: N/A;   COLONOSCOPY WITH PROPOFOL  N/A 04/14/2022   Procedure: COLONOSCOPY WITH PROPOFOL ;   Surgeon: Albertus Gordy HERO, MD;  Location: [facility];  Service: Gastroenterology;  Laterality: N/A;   CYST EXCISION     2 on face   CYST REMOVAL NECK     ESOPHAGOGASTRODUODENOSCOPY (EGD) WITH PROPOFOL  N/A 09/14/2014   Procedure: ESOPHAGOGASTRODUODENOSCOPY (EGD) WITH PROPOFOL ;  Surgeon: Lamar JONETTA Aho, MD;  Location: [facility];  Service: Endoscopy;  Laterality: N/A;   INCISION AND DRAINAGE HIP Left 01/16/2017   Procedure: IRRIGATION AND DEBRIDEMENT LEFT HIP;  Surgeon: Vernetta Lonni GRADE, MD;  Location: [facility];  Service: Orthopedics;  Laterality: Left;   INTRAMEDULLARY (IM) NAIL INTERTROCHANTERIC Left 11/29/2016   Procedure: INTRAMEDULLARY (IM) NAIL INTERTROCHANTRIC;  Surgeon: Vernetta Lonni GRADE, MD;  [facility]: [facility];  Service: Orthopedics;  Laterality: Left;   LAPAROSCOPIC LOOP COLOSTOMY N/A 07/17/2023   Procedure: LAPAROSCOPIC DIVERTING LOOP COLOSTOMY;  Surgeon: Belinda Cough, MD;  Location: [facility];  Service: General;  Laterality: N/A;   OVARY SURGERY     RIGHT HEART CATH N/A 08/12/2021   Procedure: RIGHT HEART CATH;  Surgeon: Court Dorn PARAS, MD;  Location: [facility] INVASIVE CV LAB;  Service: Cardiovascular;  Laterality: N/A;   SHOULDER HEMI-ARTHROPLASTY Left 11/15/2023   Procedure: HEMIARTHROPLASTY, SHOULDER;  Surgeon: Cristy Bonner DASEN, MD;  Location: [facility];  Service: Orthopedics;  Laterality: Left;  with Block   SHOULDER SURGERY Left    TONSILLECTOMY  age 83   VAGINAL HYSTERECTOMY     VIDEO BRONCHOSCOPY Bilateral 05/31/2013   Procedure: VIDEO BRONCHOSCOPY WITHOUT FLUORO;  Surgeon: Dorethia Cave, MD;  Location: [facility] ENDOSCOPY;  Service: Cardiopulmonary;  Laterality: Bilateral;   video bronscoscopy  05/19/1998   lung   Past Medical History:  Diagnosis Date   Acute encephalopathy 03/30/2021   11/12 - 04/03/2021 acute encephalopathy in the context of altered mental status and poor oral intake following increasing pain medicines and associated Klebsiella UTI.     Acute kidney injury  superimposed on chronic kidney disease (HCC) 04/17/2015   11/12 - 04/03/2021 AKI with creatinine of 2.22 and GFR 22 indicating stage IV renal disease in the context of encephalopathy secondary to increased pain medications with decreased oral intake of fluids and nutrition.  Acute on chronic diastolic (congestive) heart failure (HCC)    Anemia    iron  deficiency anemia - secondary to blood loss ( chronic)    Anxiety    Arthritis    endstage changes bilateral knees/bilateral ankles.    Asthma    Carotid artery occlusion    Chest pain of uncertain etiology 01/28/2012   Normal coronaries at cath     Chronic fatigue    Chronic kidney disease    Closed intertrochanteric fracture of hip, left, initial encounter (HCC) 11/29/2016   Closed left hip fracture (HCC)    Closed nondisplaced intertrochanteric fracture of left femur with delayed healing 01/14/2017   Clotting disorder (HCC)    pt denies this   Contusion of left knee    due to fall 1/14.   COPD (chronic obstructive pulmonary disease) (HCC)    pulmonary fibrosis   Coronary artery disease    Depression, reactive    Diabetes mellitus    type II    Diastolic dysfunction    Difficulty in walking    Family history of heart disease    Flu 06/26/2023   Generalized muscle weakness    Gout    Hardware complicating wound infection (HCC) 12/08/2023   High cholesterol    History of falling    Hypertension    Hypothyroidism    Interstitial lung disease (HCC)    Meningioma of left sphenoid wing involving cavernous sinus (HCC) 02/17/2012   Continue diplopia, left eye pain and left headaches.     Morbid obesity (HCC)    Neuromuscular disorder (HCC)    diabetic neuropathy    Normal coronary arteries    cardiac catheterization performed  10/31/14   Pneumonia    RA (rheumatoid arthritis) (HCC)    has been off methotreaxte since 10/13.   Septic shock (HCC) 06/13/2023   Spinal stenosis of lumbar region    Thyroid  disease    Unspecified  lack of coordination    URI (upper respiratory infection)    UTI (urinary tract infection) 03/31/2021   11/12 - 04/03/2021 Klebsiella UTI associated with encephalopathy in the context of AKI.  Status post IV ceftriaxone  followed by oral Keflex  x48 hours.     There were no vitals taken for this visit.  Opioid Risk Score:   Fall Risk Score:  `1  Depression screen [facility] 2/9     11/09/2023    1:56 PM 10/30/2023    4:19 PM 10/15/2023    2:45 PM 06/12/2023   12:05 PM 06/12/2023   12:04 PM 05/25/2023   11:22 AM 03/25/2023   11:21 AM  Depression screen PHQ 2/9  Decreased Interest 1 0 2  2 0 0  Down, Depressed, Hopeless 1 0 1  1 0 0  PHQ - 2 Score 2 0 3  3 0 0  Altered sleeping  0 1  3    Tired, decreased energy     3    Change in appetite  0 1  3    Feeling bad or failure about yourself   0 0  1    Trouble concentrating  0 0  1    Moving slowly or fidgety/restless  0 0  3    Suicidal thoughts  0 0  0    PHQ-9 Score  0 5  17    Difficult doing work/chores  Not difficult at all Somewhat difficult Somewhat difficult       Review of Systems  Musculoskeletal:  Positive  for back pain and gait problem.       Pain in : Left shoulder, left arm, right hand, pain in both knees  Skin:  Positive for wound.       Left foot, left buttock wounds  All other systems reviewed and are negative.      Objective:   Physical Exam Vitals and nursing note reviewed.  Musculoskeletal:     Comments: Virtual Visit: No Physical Assessment           Assessment & Plan:  1. Lumbar spinal stenosis with neurogenic claudication. Associated facet arthropathy/ Lumbar Radiculitis: Continue current medication regimen with Gabapentin  and HEP as tolerated. 01/29/2024 Refilled: MS Contin  15 mg one tablet at bedtime #30 and Oxycodone  5/ 325 mg one tablet twice a day as needed for pain #60. We will continue the opioid monitoring program, this consists of regular clinic visits, examinations, urine drug screen, pill counts  as well as use of [place]  Controlled Substance Reporting system. A 12 month History has been reviewed on the   Controlled Substance Reporting System 01/29/2024 2. Chronic Bilateral Thoracic Back Pain: No complaints today. Continue HEP as Tolerated. Continue to Monitor. 01/29/2024 3 Depression: Continue current medication Regime: Cymbalta  . Continue to monitor.01/29/2024 4. Diabetes mellitus type 2 with polyneuropathy: Continue current medication regime: Gabapentin . 01/29/2024 5. Rheumatoid arthritis and osteoarthritis. Continue current medication Regime.  Voltaren  Gel. Rheumatology Following. 01/29/2024 6. Interstitial lung disease: Pulmonology Following. 01/29/2024. 7. Bilateral Osteoarthritis Knee's:  Continue current medication regimen. Voltaren  Gel. 01/29/2024. 8. Muscle Spasm: Continue current medication regime : Flexeril . Continue to monitor.01/29/2024 9. Left  Greater Trochanteric Tenderness: Continue current treatment with Ice/Heat Therapy. 01/29/2024. 10. Polyarthralgia: Rheumatology Following: Continue to monitor.01/29/2024 11. Morbid Obesitity: Continue Healthy Diet Regime and HEP. Continue to monitor.  01/29/2024 12. Cervicalgia/Cervical Radiculitis: Continue to Alternate with Heat and Ice Therapy. Continue to monitor. 01/29/2024  13. Chronic Left Shoulder Pain: Continue HEP as Tolerated. Continue current medication regimen. Continue to monitor. 01/29/2024     F/U in 2 months  Virtual Visit Established Patient Location of Patient: In her Home  Location of Provider: In the Office

## 2024-01-29 NOTE — Telephone Encounter (Signed)
 Patient scheduled for mychart visit 01/29/24.  Okay per Fidela

## 2024-01-29 NOTE — Telephone Encounter (Signed)
 Called spoke with son regarding medication refills request.  Fidela spoke with son regarding patient needing appointment before refills can be considered.  Patient scheduled for mychart appointment today (01/29/24) with Fidela Ned, NP.

## 2024-01-29 NOTE — Telephone Encounter (Signed)
 Copied from CRM [id number]. Topic: Appointments - Scheduling Inquiry for Clinic >> Jan 29, 2024  1:19 PM Suzen RAMAN wrote: Reason for CRM: Patient son would like to know if patient can schedule a virtual hospital follow. Patient is bedridden and unable to come to an in person visit.  CB# [phone]

## 2024-02-01 DIAGNOSIS — J440 Chronic obstructive pulmonary disease with acute lower respiratory infection: Secondary | ICD-10-CM | POA: Diagnosis not present

## 2024-02-01 DIAGNOSIS — J189 Pneumonia, unspecified organism: Secondary | ICD-10-CM | POA: Diagnosis not present

## 2024-02-01 DIAGNOSIS — J9621 Acute and chronic respiratory failure with hypoxia: Secondary | ICD-10-CM | POA: Diagnosis not present

## 2024-02-01 DIAGNOSIS — M00012 Staphylococcal arthritis, left shoulder: Secondary | ICD-10-CM | POA: Diagnosis not present

## 2024-02-01 DIAGNOSIS — B9562 Methicillin resistant Staphylococcus aureus infection as the cause of diseases classified elsewhere: Secondary | ICD-10-CM | POA: Diagnosis not present

## 2024-02-01 DIAGNOSIS — T8459XA Infection and inflammatory reaction due to other internal joint prosthesis, initial encounter: Secondary | ICD-10-CM | POA: Diagnosis not present

## 2024-02-02 NOTE — Telephone Encounter (Signed)
 Pt is sch for 02-04-2024

## 2024-02-03 DIAGNOSIS — J9621 Acute and chronic respiratory failure with hypoxia: Secondary | ICD-10-CM | POA: Diagnosis not present

## 2024-02-03 DIAGNOSIS — T8459XA Infection and inflammatory reaction due to other internal joint prosthesis, initial encounter: Secondary | ICD-10-CM | POA: Diagnosis not present

## 2024-02-03 DIAGNOSIS — J189 Pneumonia, unspecified organism: Secondary | ICD-10-CM | POA: Diagnosis not present

## 2024-02-03 DIAGNOSIS — J440 Chronic obstructive pulmonary disease with acute lower respiratory infection: Secondary | ICD-10-CM | POA: Diagnosis not present

## 2024-02-03 DIAGNOSIS — B9562 Methicillin resistant Staphylococcus aureus infection as the cause of diseases classified elsewhere: Secondary | ICD-10-CM | POA: Diagnosis not present

## 2024-02-03 DIAGNOSIS — M00012 Staphylococcal arthritis, left shoulder: Secondary | ICD-10-CM | POA: Diagnosis not present

## 2024-02-04 ENCOUNTER — Telehealth (INDEPENDENT_AMBULATORY_CARE_PROVIDER_SITE_OTHER): Admitting: Family Medicine

## 2024-02-04 ENCOUNTER — Encounter: Payer: Self-pay | Admitting: Family Medicine

## 2024-02-04 DIAGNOSIS — L89899 Pressure ulcer of other site, unspecified stage: Secondary | ICD-10-CM

## 2024-02-04 DIAGNOSIS — I10 Essential (primary) hypertension: Secondary | ICD-10-CM

## 2024-02-04 DIAGNOSIS — L89309 Pressure ulcer of unspecified buttock, unspecified stage: Secondary | ICD-10-CM | POA: Diagnosis not present

## 2024-02-04 DIAGNOSIS — E039 Hypothyroidism, unspecified: Secondary | ICD-10-CM

## 2024-02-04 DIAGNOSIS — I5032 Chronic diastolic (congestive) heart failure: Secondary | ICD-10-CM | POA: Diagnosis not present

## 2024-02-04 DIAGNOSIS — Z939 Artificial opening status, unspecified: Secondary | ICD-10-CM

## 2024-02-04 DIAGNOSIS — E1165 Type 2 diabetes mellitus with hyperglycemia: Secondary | ICD-10-CM | POA: Diagnosis not present

## 2024-02-04 DIAGNOSIS — B9562 Methicillin resistant Staphylococcus aureus infection as the cause of diseases classified elsewhere: Secondary | ICD-10-CM | POA: Diagnosis not present

## 2024-02-04 DIAGNOSIS — M00012 Staphylococcal arthritis, left shoulder: Secondary | ICD-10-CM | POA: Diagnosis not present

## 2024-02-04 DIAGNOSIS — I272 Pulmonary hypertension, unspecified: Secondary | ICD-10-CM | POA: Diagnosis not present

## 2024-02-04 DIAGNOSIS — E782 Mixed hyperlipidemia: Secondary | ICD-10-CM | POA: Diagnosis not present

## 2024-02-04 DIAGNOSIS — N39498 Other specified urinary incontinence: Secondary | ICD-10-CM | POA: Diagnosis not present

## 2024-02-04 DIAGNOSIS — Z794 Long term (current) use of insulin: Secondary | ICD-10-CM

## 2024-02-04 DIAGNOSIS — J440 Chronic obstructive pulmonary disease with acute lower respiratory infection: Secondary | ICD-10-CM | POA: Diagnosis not present

## 2024-02-04 DIAGNOSIS — G894 Chronic pain syndrome: Secondary | ICD-10-CM | POA: Diagnosis not present

## 2024-02-04 DIAGNOSIS — J9621 Acute and chronic respiratory failure with hypoxia: Secondary | ICD-10-CM | POA: Diagnosis not present

## 2024-02-04 DIAGNOSIS — J189 Pneumonia, unspecified organism: Secondary | ICD-10-CM | POA: Diagnosis not present

## 2024-02-04 DIAGNOSIS — T8459XA Infection and inflammatory reaction due to other internal joint prosthesis, initial encounter: Secondary | ICD-10-CM | POA: Diagnosis not present

## 2024-02-04 MED ORDER — FUROSEMIDE 20 MG PO TABS: 20.0000 mg | ORAL_TABLET | Freq: Every day | ORAL | 3 refills | Status: AC

## 2024-02-04 MED ORDER — LANTUS SOLOSTAR 100 UNIT/ML ~~LOC~~ SOPN: 12.0000 [IU] | PEN_INJECTOR | Freq: Every day | SUBCUTANEOUS | 5 refills | Status: AC

## 2024-02-04 NOTE — Progress Notes (Addendum)
 Virtual Visit via Video Note  I connected with [name] on 02/04/24 at 10:30 AM EDT by a video enabled telemedicine application and verified that I am speaking with the correct person using two identifiers.  Location patient: home Location provider:work or home office Persons participating in the virtual visit: patient, provider  I discussed the limitations of evaluation and management by telemedicine and the availability of in person appointments. The patient expressed understanding and agreed to proceed. Chief Complaint  Patient presents with   Medical Management of Chronic Issues    Follow-up     HPI: Pt is an 83 yo female with pmh sig for HTN ,DM II, RA, ILD on 3L O2, chronic hypoxic resp failure, HFpEF, hypothyroidism, nonobstructive CAD, COPD, HLD, pressure ulcer who is seen for HFU/follow-up on chronic conditions.  Pt admitted 7/29-12/23/23 for acute on chronic respiratory failure.  Prior to this hospitalization, pt hospitalized 6/27-11/22/23 for CAP and septic arthritis of L shoulder treated with IR aspiration of intramuscular abscess 6/28 (MRSA+), s/p L shoulder hemiarthroplasty 6/29.  Daptomycin  via PICC until 12/27/2023 per ID.    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  Started on Solu-Medrol  IV x 2 d which decreased high flow O2 requirement.  Prednisone  taper started.  Bactrim  prophylaxis MWF while on prednisone .  Given Vanc and Zosyn .  Weaned to HFNC 10 L O2.  [facility]6-8/27/25.  Pt is at home.  Has bed sores on L leg and b/l heels.  Pt started sitting up yesterday in bed.  Now back down to 3L O2.  Appetite finally came back.  Not having any issues with swallowing.   Has an ostomy.  Placed 06/2023 2/2 GIB due to sigmoid stricture s/p colectomy and colostomy creation.  BS today was 176.  Was 276 yesterday.   Taking insulin .  Pt concerned she may be dismissed from pain management as it is currently  difficulty to leave the home and they are requesting she be seen in person for next OFV for refills.  Family inquires about a pure wick system and refills on meds since being d/c.    ROS: See pertinent positives and negatives per HPI.  Past Medical History:  Diagnosis Date   Acute encephalopathy 03/30/2021   11/12 - 04/03/2021 acute encephalopathy in the context of altered mental status and poor oral intake following increasing pain medicines and associated Klebsiella UTI.     Acute kidney injury superimposed on chronic kidney disease (HCC) 04/17/2015   11/12 - 04/03/2021 AKI with creatinine of 2.22 and GFR 22 indicating stage IV renal disease in the context of encephalopathy secondary to increased pain medications with decreased oral intake of fluids and nutrition.     Acute on chronic diastolic (congestive) heart failure (HCC)    Anemia    iron  deficiency anemia - secondary to blood loss ( chronic)    Anxiety    Arthritis    endstage changes bilateral knees/bilateral ankles.    Asthma    Carotid artery occlusion    Chest pain of uncertain etiology 01/28/2012   Normal coronaries at cath     Chronic fatigue    Chronic kidney disease    Closed intertrochanteric fracture of hip, left, initial encounter (HCC) 11/29/2016   Closed left hip fracture (HCC)    Closed nondisplaced intertrochanteric fracture of left femur with delayed healing 01/14/2017   Clotting disorder (HCC)    pt denies this   Contusion of  left knee    due to fall 1/14.   COPD (chronic obstructive pulmonary disease) (HCC)    pulmonary fibrosis   Coronary artery disease    Depression, reactive    Diabetes mellitus    type II    Diastolic dysfunction    Difficulty in walking    Family history of heart disease    Flu 06/26/2023   Generalized muscle weakness    Gout    Hardware complicating wound infection (HCC) 12/08/2023   High cholesterol    History of falling    Hypertension    Hypothyroidism     Interstitial lung disease (HCC)    Meningioma of left sphenoid wing involving cavernous sinus (HCC) 02/17/2012   Continue diplopia, left eye pain and left headaches.     Morbid obesity (HCC)    Neuromuscular disorder (HCC)    diabetic neuropathy    Normal coronary arteries    cardiac catheterization performed  10/31/14   Pneumonia    RA (rheumatoid arthritis) (HCC)    has been off methotreaxte since 10/13.   Septic shock (HCC) 06/13/2023   Spinal stenosis of lumbar region    Thyroid  disease    Unspecified lack of coordination    URI (upper respiratory infection)    UTI (urinary tract infection) 03/31/2021   11/12 - 04/03/2021 Klebsiella UTI associated with encephalopathy in the context of AKI.  Status post IV ceftriaxone  followed by oral Keflex  x48 hours.      Past Surgical History:  Procedure Laterality Date   BRAIN SURGERY     Gamma knife 10/13. Needs repeat spring  '14   CARDIAC CATHETERIZATION N/A 10/31/2014   Procedure: Right/Left Heart Cath and Coronary Angiography;  Surgeon: Candyce GORMAN Reek, MD;  Location: [facility] INVASIVE CV LAB;  Service: Cardiovascular;  Laterality: N/A;   COLONOSCOPY WITH PROPOFOL  N/A 04/14/2022   Procedure: COLONOSCOPY WITH PROPOFOL ;  Surgeon: Albertus Gordy HERO, MD;  Location: [facility];  Service: Gastroenterology;  Laterality: N/A;   CYST EXCISION     2 on face   CYST REMOVAL NECK     ESOPHAGOGASTRODUODENOSCOPY (EGD) WITH PROPOFOL  N/A 09/14/2014   Procedure: ESOPHAGOGASTRODUODENOSCOPY (EGD) WITH PROPOFOL ;  Surgeon: Lamar JONETTA Aho, MD;  Location: [facility];  Service: Endoscopy;  Laterality: N/A;   INCISION AND DRAINAGE HIP Left 01/16/2017   Procedure: IRRIGATION AND DEBRIDEMENT LEFT HIP;  Surgeon: Vernetta Lonni GRADE, MD;  Location: [facility] ORS;  Service: Orthopedics;  Laterality: Left;   INTRAMEDULLARY (IM) NAIL INTERTROCHANTERIC Left 11/29/2016   Procedure: INTRAMEDULLARY (IM) NAIL INTERTROCHANTRIC;  Surgeon: Vernetta Lonni GRADE, MD;  Location:  [facility] OR;  Service: Orthopedics;  Laterality: Left;   LAPAROSCOPIC LOOP COLOSTOMY N/A 07/17/2023   Procedure: LAPAROSCOPIC DIVERTING LOOP COLOSTOMY;  Surgeon: Belinda Cough, MD;  Location: [facility] OR;  Service: General;  Laterality: N/A;   OVARY SURGERY     RIGHT HEART CATH N/A 08/12/2021   Procedure: RIGHT HEART CATH;  Surgeon: Court Dorn PARAS, MD;  Location: [facility] INVASIVE CV LAB;  Service: Cardiovascular;  Laterality: N/A;   SHOULDER HEMI-ARTHROPLASTY Left 11/15/2023   Procedure: HEMIARTHROPLASTY, SHOULDER;  Surgeon: Cristy Bonner DASEN, MD;  Location: [facility] OR;  Service: Orthopedics;  Laterality: Left;  with Block   SHOULDER SURGERY Left    TONSILLECTOMY  age 47   VAGINAL HYSTERECTOMY     VIDEO BRONCHOSCOPY Bilateral 05/31/2013   Procedure: VIDEO BRONCHOSCOPY WITHOUT FLUORO;  Surgeon: Dorethia Cave, MD;  Location: [facility]COPY;  Service: Cardiopulmonary;  Laterality: Bilateral;   video bronscoscopy  05/19/1998   lung    Family History  Problem Relation Age of Onset   Diabetes Mother    Heart attack Mother    Hypertension Father    Lung cancer Father    Diabetes Sister    Breast cancer Sister    Breast cancer Sister    Diabetes Brother    Hypertension Brother    Heart disease Brother    Heart attack Brother    Kidney cancer Brother    Gout Brother    Kidney failure Brother        x 5   Esophageal cancer Brother    Stomach cancer Brother    Prostate cancer Brother    Uterine cancer Daughter        with mets   Fibroids Daughter    Hypertension Daughter    Hypertension Daughter    Hypertension Son    Heart murmur Son    Rheum arthritis Maternal Uncle    Alzheimer's disease Neg Hx    Dementia Neg Hx       Current Outpatient Medications:    acetaminophen  (TYLENOL ) 500 MG tablet, Take 1,000 mg by mouth every 8 (eight) hours as needed (pain)., Disp: , Rfl:    Adalimumab-bwwd (HADLIMA [place]), Inject into the skin., Disp: , Rfl:    [Paused] albuterol  (VENTOLIN  HFA) 108 (90 Base) MCG/ACT  inhaler, Inhale 2 puffs into the lungs in the morning and at bedtime., Disp: , Rfl:    amLODipine  (NORVASC ) 5 MG tablet, Take 1 tablet (5 mg total) by mouth daily., Disp: , Rfl:    atorvastatin  (LIPITOR) 10 MG tablet, Take 1 tablet (10 mg total) by mouth daily., Disp: 90 tablet, Rfl: 3   [Paused] bisacodyl  (DULCOLAX) 10 MG suppository, Place 10 mg rectally daily as needed (bowel management)., Disp: , Rfl:    [Paused] Cholecalciferol  (VITAMIN D3) 50 MCG (2000 UT) TABS, Take 2,000 Units by mouth daily., Disp: , Rfl:    cloNIDine  (CATAPRES ) 0.1 MG tablet, Take 1 tablet (0.1 mg total) by mouth 3 (three) times daily., Disp: , Rfl:    [Paused] diclofenac  Sodium (VOLTAREN ) 1 % GEL, Apply 2 g topically 3 (three) times daily., Disp: , Rfl:    DULoxetine  (CYMBALTA ) 60 MG capsule, Take 1 capsule (60 mg total) by mouth daily., Disp: 3 capsule, Rfl: 0   enoxaparin  (LOVENOX ) 40 MG/0.4ML injection, Inject 0.4 mLs (40 mg total) into the skin daily., Disp: , Rfl:    [Paused] feeding supplement (ENSURE PLUS HIGH PROTEIN) LIQD, Take 237 mLs by mouth 2 (two) times daily between meals., Disp: , Rfl:    furosemide  (LASIX ) 20 MG tablet, Take 1 tablet (20 mg total) by mouth daily., Disp: , Rfl:    gabapentin  (NEURONTIN ) 300 MG capsule, Take 1 capsule (300 mg total) by mouth 2 (two) times daily., Disp: , Rfl:    guaiFENesin  (MUCINEX ) 600 MG 12 hr tablet, Take 2 tablets (1,200 mg total) by mouth 2 (two) times daily., Disp: , Rfl:    [Paused] heparin  lock flush 100 UNIT/ML SOLN injection, Inject 5 mLs into the vein daily., Disp: , Rfl:    [Paused] hydrocortisone  (ANUSOL -HC) 2.5 % rectal cream, Place 1 Application rectally 2 (two) times daily., Disp: , Rfl:    [Paused] hyoscyamine  (LEVSIN  SL) 0.125 MG SL tablet, Place 1 tablet (0.125 mg total) under the tongue every 8 (eight) hours as needed., Disp: 30 tablet, Rfl: 1   insulin  aspart (NOVOLOG ) 100 UNIT/ML injection, Inject 0-9 Units into the skin  every 4 (four) hours., Disp: ,  Rfl:    [Paused] leptospermum manuka honey (MEDIHONEY) PSTE paste, Apply 1 Application topically daily. Interchangeable with TheraHoney Apply thin layer (3 mm) to wound., Disp: , Rfl:    levothyroxine  (SYNTHROID ) 50 MCG tablet, Take 1 tablet (50 mcg total) by mouth daily., Disp: 90 tablet, Rfl: 3   [Paused] lidocaine  (LIDODERM ) 5 %, Place 1 patch onto the skin daily. Remove & Discard patch within 12 hours or as directed by MD, Disp: 30 patch, Rfl: 1   MAGNESIUM  PO, , Disp: , Rfl:    melatonin 3 MG TABS tablet, Take 1 tablet (3 mg total) by mouth at bedtime as needed (insomnia)., Disp: , Rfl:    methocarbamol  (ROBAXIN ) 500 MG tablet, Take 1 tablet (500 mg total) by mouth every 8 (eight) hours as needed for muscle spasms., Disp: , Rfl:    metoprolol  tartrate (LOPRESSOR ) 100 MG tablet, Take 1 tablet (100 mg total) by mouth 2 (two) times daily., Disp: , Rfl:    morphine  (MS CONTIN ) 15 MG 12 hr tablet, Take 1 tablet (15 mg total) by mouth at bedtime., Disp: 30 tablet, Rfl: 0   morphine  (MSIR) 15 MG tablet, Take 1 tablet (15 mg total) by mouth every 4 (four) hours as needed for moderate pain (pain score 4-6)., Disp: , Rfl:    [Paused] Multiple Vitamin (MULTIVITAMIN) tablet, Take 1 tablet by mouth daily., Disp: , Rfl:    nitroGLYCERIN  (NITROSTAT ) 0.4 MG SL tablet, Place 1 tablet (0.4 mg total) under the tongue every 5 (five) minutes as needed for chest pain., Disp: , Rfl:    ondansetron  (ZOFRAN ) 4 MG/2ML SOLN injection, Inject 2 mLs (4 mg total) into the vein every 6 (six) hours as needed for nausea or vomiting., Disp: , Rfl:    [Paused] oxyCODONE  (OXY IR/ROXICODONE ) 5 MG immediate release tablet, Take 1 pills every 6 hrs as needed for severe breakthrough pain, Disp: 30 tablet, Rfl: 0   OXYGEN , Inhale 3 L/min into the lungs at bedtime. As needed during day time, Disp: , Rfl:    pantoprazole  (PROTONIX ) 40 MG tablet, Take 1 tablet (40 mg total) by mouth 2 (two) times daily., Disp: 120 tablet, Rfl: 4    predniSONE  (DELTASONE ) 20 MG tablet, Take 4 tablets (80 mg total) by mouth daily with breakfast., Disp: , Rfl:    sulfamethoxazole -trimethoprim  (BACTRIM  DS) 800-160 MG tablet, Take 1 tablet by mouth 3 (three) times a week., Disp: , Rfl:    traZODone  (DESYREL ) 50 MG tablet, 50 mg., Disp: , Rfl:    oxyCODONE -acetaminophen  (PERCOCET) 5-325 MG tablet, Take 1 tablet by mouth 2 (two) times daily as needed for moderate pain (pain score 4-6). (Patient not taking: Reported on 02/04/2024), Disp: 60 tablet, Rfl: 0  EXAM:  VITALS per patient if applicable: RR between 12-20 bpm on 3L O2 via [place].  GENERAL: alert, oriented, appears non toxic and in no acute distress.  Sitting up in hospital bed.  Seated  HEENT: atraumatic, conjunctiva clear, no obvious abnormalities on inspection of external nose and ears  NECK: normal movements of the head and neck  LUNGS: on inspection no signs of respiratory distress, breathing rate appears normal, no obvious gross SOB, gasping or wheezing  CV: no obvious cyanosis  MS: moves all visible extremities without noticeable abnormality  PSYCH/NEURO: pleasant and cooperative, no obvious depression or anxiety, speech and thought processing grossly intact  ASSESSMENT AND PLAN:  Discussed the following assessment and plan:  Type 2 diabetes mellitus  with hyperglycemia, with long-term current use of insulin  (HCC) - Plan: insulin  glargine (LANTUS  SOLOSTAR) 100 UNIT/ML Solostar Pen  History of creation of ostomy (HCC)  Chronic diastolic CHF (congestive heart failure) (HCC) - Plan: furosemide  (LASIX ) 20 MG tablet, amLODipine  (NORVASC ) 5 MG tablet, metoprolol  tartrate (LOPRESSOR ) 100 MG tablet  Essential hypertension - Plan: amLODipine  (NORVASC ) 5 MG tablet, metoprolol  tartrate (LOPRESSOR ) 100 MG tablet  Pulmonary HTN (HCC) -mild per [facility] 07/2021 -continue current meds -continue f/u with Cards and Pulm  Other urinary incontinence - Plan: For home use only DME Other see  comment  Mixed hyperlipidemia - Plan: atorvastatin  (LIPITOR) 10 MG tablet  Acquired hypothyroidism - Plan: levothyroxine  (SYNTHROID ) 50 MCG tablet  Chronic pain syndrome - Plan: DULoxetine  (CYMBALTA ) 60 MG capsule, gabapentin  (NEURONTIN ) 300 MG capsule  Pressure injury of skin of buttock, unspecified injury stage, unspecified laterality  Bilateral pressure ulcer of feet  Diabetes well controlled.  A1c 5.7% on 01/12/2024.  Continue insulin  and diabetic diet.  Increase protein to help with healing.  Lantus  refilled.  Will okay ostomy supplies as needed if requested by [facility] nursing.  Continue follow-up with GI.  HH wound care for pressure ulcers.  Discussed regular turning/repositioning in bed to take pressure off affected areas.  Monitor wounds regularly.  Notify clinic/home health for worsening wounds.  Continue working with HHPT.  Advised will likely take an extended period of time to recover given current state of debility.  Advised PureWick system may not be covered by insurance.  CHF stable.  Monitor LE edema.   Elevate LEs when sitting/Laying down.  Consider Ace bandages as difficult to use TED hose/compression socks.   Current medications refilled.  Advised on need to continue monitoring BP and adjust medicines accordingly.  Continue 3 L O2 via [place].  Continue follow-up with home, Dr. Geronimo.  Continue follow-up with rheumatology for RA.  Continue follow-up with pain management.  Advised to contact pain management in regards to current situation and difficulty making it to in person appointments.   Close follow-up 4-6 wks advised with this provider and specialist.  I discussed the assessment and treatment plan with the patient. The patient was provided an opportunity to ask questions and all were answered. The patient agreed with the plan and demonstrated an understanding of the instructions.   The patient was advised to call back or seek an in-person evaluation if the symptoms worsen or  if the condition fails to improve as anticipated.   Clotilda JONELLE Single, MD

## 2024-02-08 DIAGNOSIS — J9621 Acute and chronic respiratory failure with hypoxia: Secondary | ICD-10-CM | POA: Diagnosis not present

## 2024-02-08 DIAGNOSIS — T8459XA Infection and inflammatory reaction due to other internal joint prosthesis, initial encounter: Secondary | ICD-10-CM | POA: Diagnosis not present

## 2024-02-08 DIAGNOSIS — B9562 Methicillin resistant Staphylococcus aureus infection as the cause of diseases classified elsewhere: Secondary | ICD-10-CM | POA: Diagnosis not present

## 2024-02-08 DIAGNOSIS — J440 Chronic obstructive pulmonary disease with acute lower respiratory infection: Secondary | ICD-10-CM | POA: Diagnosis not present

## 2024-02-08 DIAGNOSIS — M00012 Staphylococcal arthritis, left shoulder: Secondary | ICD-10-CM | POA: Diagnosis not present

## 2024-02-08 DIAGNOSIS — J189 Pneumonia, unspecified organism: Secondary | ICD-10-CM | POA: Diagnosis not present

## 2024-02-09 DIAGNOSIS — J189 Pneumonia, unspecified organism: Secondary | ICD-10-CM | POA: Diagnosis not present

## 2024-02-09 DIAGNOSIS — M00012 Staphylococcal arthritis, left shoulder: Secondary | ICD-10-CM | POA: Diagnosis not present

## 2024-02-09 DIAGNOSIS — B9562 Methicillin resistant Staphylococcus aureus infection as the cause of diseases classified elsewhere: Secondary | ICD-10-CM | POA: Diagnosis not present

## 2024-02-09 DIAGNOSIS — T8459XA Infection and inflammatory reaction due to other internal joint prosthesis, initial encounter: Secondary | ICD-10-CM | POA: Diagnosis not present

## 2024-02-09 DIAGNOSIS — J9621 Acute and chronic respiratory failure with hypoxia: Secondary | ICD-10-CM | POA: Diagnosis not present

## 2024-02-09 DIAGNOSIS — J440 Chronic obstructive pulmonary disease with acute lower respiratory infection: Secondary | ICD-10-CM | POA: Diagnosis not present

## 2024-02-10 DIAGNOSIS — J189 Pneumonia, unspecified organism: Secondary | ICD-10-CM | POA: Diagnosis not present

## 2024-02-10 DIAGNOSIS — M00012 Staphylococcal arthritis, left shoulder: Secondary | ICD-10-CM | POA: Diagnosis not present

## 2024-02-10 DIAGNOSIS — J440 Chronic obstructive pulmonary disease with acute lower respiratory infection: Secondary | ICD-10-CM | POA: Diagnosis not present

## 2024-02-10 DIAGNOSIS — T8459XA Infection and inflammatory reaction due to other internal joint prosthesis, initial encounter: Secondary | ICD-10-CM | POA: Diagnosis not present

## 2024-02-10 DIAGNOSIS — J9621 Acute and chronic respiratory failure with hypoxia: Secondary | ICD-10-CM | POA: Diagnosis not present

## 2024-02-10 DIAGNOSIS — B9562 Methicillin resistant Staphylococcus aureus infection as the cause of diseases classified elsewhere: Secondary | ICD-10-CM | POA: Diagnosis not present

## 2024-02-11 DIAGNOSIS — M00012 Staphylococcal arthritis, left shoulder: Secondary | ICD-10-CM | POA: Diagnosis not present

## 2024-02-11 DIAGNOSIS — J9621 Acute and chronic respiratory failure with hypoxia: Secondary | ICD-10-CM | POA: Diagnosis not present

## 2024-02-11 DIAGNOSIS — T8459XA Infection and inflammatory reaction due to other internal joint prosthesis, initial encounter: Secondary | ICD-10-CM | POA: Diagnosis not present

## 2024-02-11 DIAGNOSIS — J189 Pneumonia, unspecified organism: Secondary | ICD-10-CM | POA: Diagnosis not present

## 2024-02-11 DIAGNOSIS — B9562 Methicillin resistant Staphylococcus aureus infection as the cause of diseases classified elsewhere: Secondary | ICD-10-CM | POA: Diagnosis not present

## 2024-02-11 DIAGNOSIS — J440 Chronic obstructive pulmonary disease with acute lower respiratory infection: Secondary | ICD-10-CM | POA: Diagnosis not present

## 2024-02-12 ENCOUNTER — Telehealth: Payer: Self-pay

## 2024-02-12 ENCOUNTER — Encounter: Payer: Self-pay | Admitting: Family Medicine

## 2024-02-12 DIAGNOSIS — B9562 Methicillin resistant Staphylococcus aureus infection as the cause of diseases classified elsewhere: Secondary | ICD-10-CM | POA: Diagnosis not present

## 2024-02-12 DIAGNOSIS — T8459XA Infection and inflammatory reaction due to other internal joint prosthesis, initial encounter: Secondary | ICD-10-CM | POA: Diagnosis not present

## 2024-02-12 DIAGNOSIS — J9621 Acute and chronic respiratory failure with hypoxia: Secondary | ICD-10-CM | POA: Diagnosis not present

## 2024-02-12 DIAGNOSIS — M00012 Staphylococcal arthritis, left shoulder: Secondary | ICD-10-CM | POA: Diagnosis not present

## 2024-02-12 DIAGNOSIS — J189 Pneumonia, unspecified organism: Secondary | ICD-10-CM | POA: Diagnosis not present

## 2024-02-12 DIAGNOSIS — J440 Chronic obstructive pulmonary disease with acute lower respiratory infection: Secondary | ICD-10-CM | POA: Diagnosis not present

## 2024-02-12 NOTE — Telephone Encounter (Signed)
 Copied from CRM #[id number]. Topic: Clinical - Medication Question >> Feb 12, 2024  1:26 PM Terri G wrote: Reason for CRM: Patient son [name] is calling regarding her prescriptions that were set up coming from [facility]. She will run out of them Saturday and the pharmacy stated that she does not have any refills in the system. They need authorization for them. Callback number [phone]

## 2024-02-15 DIAGNOSIS — J8410 Pulmonary fibrosis, unspecified: Secondary | ICD-10-CM | POA: Diagnosis not present

## 2024-02-15 DIAGNOSIS — Z79890 Hormone replacement therapy (postmenopausal): Secondary | ICD-10-CM | POA: Diagnosis not present

## 2024-02-15 DIAGNOSIS — I5032 Chronic diastolic (congestive) heart failure: Secondary | ICD-10-CM | POA: Diagnosis not present

## 2024-02-15 DIAGNOSIS — M00012 Staphylococcal arthritis, left shoulder: Secondary | ICD-10-CM | POA: Diagnosis not present

## 2024-02-15 DIAGNOSIS — E785 Hyperlipidemia, unspecified: Secondary | ICD-10-CM | POA: Diagnosis not present

## 2024-02-15 DIAGNOSIS — B9562 Methicillin resistant Staphylococcus aureus infection as the cause of diseases classified elsewhere: Secondary | ICD-10-CM | POA: Diagnosis not present

## 2024-02-15 DIAGNOSIS — J440 Chronic obstructive pulmonary disease with acute lower respiratory infection: Secondary | ICD-10-CM | POA: Diagnosis not present

## 2024-02-15 DIAGNOSIS — I110 Hypertensive heart disease with heart failure: Secondary | ICD-10-CM | POA: Diagnosis not present

## 2024-02-15 DIAGNOSIS — Z9181 History of falling: Secondary | ICD-10-CM | POA: Diagnosis not present

## 2024-02-15 DIAGNOSIS — E119 Type 2 diabetes mellitus without complications: Secondary | ICD-10-CM | POA: Diagnosis not present

## 2024-02-15 DIAGNOSIS — N289 Disorder of kidney and ureter, unspecified: Secondary | ICD-10-CM | POA: Diagnosis not present

## 2024-02-15 DIAGNOSIS — Z79899 Other long term (current) drug therapy: Secondary | ICD-10-CM | POA: Diagnosis not present

## 2024-02-15 DIAGNOSIS — Z933 Colostomy status: Secondary | ICD-10-CM | POA: Diagnosis not present

## 2024-02-15 DIAGNOSIS — D649 Anemia, unspecified: Secondary | ICD-10-CM | POA: Diagnosis not present

## 2024-02-15 DIAGNOSIS — E039 Hypothyroidism, unspecified: Secondary | ICD-10-CM | POA: Diagnosis not present

## 2024-02-15 DIAGNOSIS — L89622 Pressure ulcer of left heel, stage 2: Secondary | ICD-10-CM | POA: Diagnosis not present

## 2024-02-15 DIAGNOSIS — J9621 Acute and chronic respiratory failure with hypoxia: Secondary | ICD-10-CM | POA: Diagnosis not present

## 2024-02-15 DIAGNOSIS — T8459XA Infection and inflammatory reaction due to other internal joint prosthesis, initial encounter: Secondary | ICD-10-CM | POA: Diagnosis not present

## 2024-02-15 DIAGNOSIS — Z9981 Dependence on supplemental oxygen: Secondary | ICD-10-CM | POA: Diagnosis not present

## 2024-02-15 DIAGNOSIS — Z7952 Long term (current) use of systemic steroids: Secondary | ICD-10-CM | POA: Diagnosis not present

## 2024-02-15 DIAGNOSIS — K649 Unspecified hemorrhoids: Secondary | ICD-10-CM | POA: Diagnosis not present

## 2024-02-15 DIAGNOSIS — J189 Pneumonia, unspecified organism: Secondary | ICD-10-CM | POA: Diagnosis not present

## 2024-02-15 DIAGNOSIS — R32 Unspecified urinary incontinence: Secondary | ICD-10-CM | POA: Diagnosis not present

## 2024-02-15 DIAGNOSIS — L89322 Pressure ulcer of left buttock, stage 2: Secondary | ICD-10-CM | POA: Diagnosis not present

## 2024-02-15 MED ORDER — DULOXETINE HCL 60 MG PO CPEP: 60.0000 mg | ORAL_CAPSULE | Freq: Every day | ORAL | 1 refills | Status: AC

## 2024-02-15 MED ORDER — TRAZODONE HCL 50 MG PO TABS: 50.0000 mg | ORAL_TABLET | Freq: Every evening | ORAL | 1 refills | Status: AC | PRN

## 2024-02-15 MED ORDER — GABAPENTIN 300 MG PO CAPS
300.0000 mg | ORAL_CAPSULE | Freq: Two times a day (BID) | ORAL | 3 refills | Status: AC
Start: 2024-02-15 — End: ?

## 2024-02-15 MED ORDER — LEVOTHYROXINE SODIUM 50 MCG PO TABS: 50.0000 ug | ORAL_TABLET | Freq: Every day | ORAL | 3 refills | Status: AC

## 2024-02-15 MED ORDER — AMLODIPINE BESYLATE 5 MG PO TABS: 5.0000 mg | ORAL_TABLET | Freq: Every day | ORAL | 3 refills | Status: AC

## 2024-02-15 MED ORDER — ATORVASTATIN CALCIUM 10 MG PO TABS: 10.0000 mg | ORAL_TABLET | Freq: Every day | ORAL | 3 refills | Status: AC

## 2024-02-15 MED ORDER — METOPROLOL TARTRATE 100 MG PO TABS: 100.0000 mg | ORAL_TABLET | Freq: Two times a day (BID) | ORAL | 3 refills | Status: AC

## 2024-02-16 DIAGNOSIS — M00012 Staphylococcal arthritis, left shoulder: Secondary | ICD-10-CM | POA: Diagnosis not present

## 2024-02-16 DIAGNOSIS — J189 Pneumonia, unspecified organism: Secondary | ICD-10-CM | POA: Diagnosis not present

## 2024-02-16 DIAGNOSIS — B9562 Methicillin resistant Staphylococcus aureus infection as the cause of diseases classified elsewhere: Secondary | ICD-10-CM | POA: Diagnosis not present

## 2024-02-16 DIAGNOSIS — J9621 Acute and chronic respiratory failure with hypoxia: Secondary | ICD-10-CM | POA: Diagnosis not present

## 2024-02-16 DIAGNOSIS — J440 Chronic obstructive pulmonary disease with acute lower respiratory infection: Secondary | ICD-10-CM | POA: Diagnosis not present

## 2024-02-16 DIAGNOSIS — T8459XA Infection and inflammatory reaction due to other internal joint prosthesis, initial encounter: Secondary | ICD-10-CM | POA: Diagnosis not present

## 2024-02-17 NOTE — Telephone Encounter (Signed)
Matter already addressed

## 2024-02-19 DIAGNOSIS — J9621 Acute and chronic respiratory failure with hypoxia: Secondary | ICD-10-CM | POA: Diagnosis not present

## 2024-02-19 DIAGNOSIS — T8459XA Infection and inflammatory reaction due to other internal joint prosthesis, initial encounter: Secondary | ICD-10-CM | POA: Diagnosis not present

## 2024-02-19 DIAGNOSIS — M00012 Staphylococcal arthritis, left shoulder: Secondary | ICD-10-CM | POA: Diagnosis not present

## 2024-02-19 DIAGNOSIS — J189 Pneumonia, unspecified organism: Secondary | ICD-10-CM | POA: Diagnosis not present

## 2024-02-19 DIAGNOSIS — B9562 Methicillin resistant Staphylococcus aureus infection as the cause of diseases classified elsewhere: Secondary | ICD-10-CM | POA: Diagnosis not present

## 2024-02-19 DIAGNOSIS — J440 Chronic obstructive pulmonary disease with acute lower respiratory infection: Secondary | ICD-10-CM | POA: Diagnosis not present

## 2024-02-22 DIAGNOSIS — J189 Pneumonia, unspecified organism: Secondary | ICD-10-CM | POA: Diagnosis not present

## 2024-02-22 DIAGNOSIS — J9621 Acute and chronic respiratory failure with hypoxia: Secondary | ICD-10-CM | POA: Diagnosis not present

## 2024-02-22 DIAGNOSIS — T8459XA Infection and inflammatory reaction due to other internal joint prosthesis, initial encounter: Secondary | ICD-10-CM | POA: Diagnosis not present

## 2024-02-22 DIAGNOSIS — B9562 Methicillin resistant Staphylococcus aureus infection as the cause of diseases classified elsewhere: Secondary | ICD-10-CM | POA: Diagnosis not present

## 2024-02-22 DIAGNOSIS — M00012 Staphylococcal arthritis, left shoulder: Secondary | ICD-10-CM | POA: Diagnosis not present

## 2024-02-22 DIAGNOSIS — J440 Chronic obstructive pulmonary disease with acute lower respiratory infection: Secondary | ICD-10-CM | POA: Diagnosis not present

## 2024-02-24 DIAGNOSIS — J9621 Acute and chronic respiratory failure with hypoxia: Secondary | ICD-10-CM | POA: Diagnosis not present

## 2024-02-24 DIAGNOSIS — B9562 Methicillin resistant Staphylococcus aureus infection as the cause of diseases classified elsewhere: Secondary | ICD-10-CM | POA: Diagnosis not present

## 2024-02-24 DIAGNOSIS — J440 Chronic obstructive pulmonary disease with acute lower respiratory infection: Secondary | ICD-10-CM | POA: Diagnosis not present

## 2024-02-24 DIAGNOSIS — M00012 Staphylococcal arthritis, left shoulder: Secondary | ICD-10-CM | POA: Diagnosis not present

## 2024-02-24 DIAGNOSIS — J189 Pneumonia, unspecified organism: Secondary | ICD-10-CM | POA: Diagnosis not present

## 2024-02-24 DIAGNOSIS — T8459XA Infection and inflammatory reaction due to other internal joint prosthesis, initial encounter: Secondary | ICD-10-CM | POA: Diagnosis not present

## 2024-02-25 ENCOUNTER — Telehealth: Payer: Self-pay | Admitting: Registered Nurse

## 2024-02-25 DIAGNOSIS — J189 Pneumonia, unspecified organism: Secondary | ICD-10-CM | POA: Diagnosis not present

## 2024-02-25 DIAGNOSIS — G8929 Other chronic pain: Secondary | ICD-10-CM

## 2024-02-25 DIAGNOSIS — T8459XA Infection and inflammatory reaction due to other internal joint prosthesis, initial encounter: Secondary | ICD-10-CM | POA: Diagnosis not present

## 2024-02-25 DIAGNOSIS — G894 Chronic pain syndrome: Secondary | ICD-10-CM

## 2024-02-25 DIAGNOSIS — B9562 Methicillin resistant Staphylococcus aureus infection as the cause of diseases classified elsewhere: Secondary | ICD-10-CM | POA: Diagnosis not present

## 2024-02-25 DIAGNOSIS — M00012 Staphylococcal arthritis, left shoulder: Secondary | ICD-10-CM | POA: Diagnosis not present

## 2024-02-25 DIAGNOSIS — J9621 Acute and chronic respiratory failure with hypoxia: Secondary | ICD-10-CM | POA: Diagnosis not present

## 2024-02-25 DIAGNOSIS — J440 Chronic obstructive pulmonary disease with acute lower respiratory infection: Secondary | ICD-10-CM | POA: Diagnosis not present

## 2024-02-25 MED ORDER — MORPHINE SULFATE ER 15 MG PO TBCR: 15.0000 mg | EXTENDED_RELEASE_TABLET | Freq: Every day | ORAL | 0 refills | Status: DC

## 2024-02-25 MED ORDER — OXYCODONE-ACETAMINOPHEN 5-325 MG PO TABS: 1.0000 | ORAL_TABLET | Freq: Two times a day (BID) | ORAL | 0 refills | Status: DC | PRN

## 2024-02-25 NOTE — Telephone Encounter (Signed)
 PMP was Reviewed.  MS Contin  and Oxycodone  e-scribed today.  Son is aware via My-Chart

## 2024-02-26 DIAGNOSIS — J440 Chronic obstructive pulmonary disease with acute lower respiratory infection: Secondary | ICD-10-CM | POA: Diagnosis not present

## 2024-02-26 DIAGNOSIS — J189 Pneumonia, unspecified organism: Secondary | ICD-10-CM | POA: Diagnosis not present

## 2024-02-26 DIAGNOSIS — B9562 Methicillin resistant Staphylococcus aureus infection as the cause of diseases classified elsewhere: Secondary | ICD-10-CM | POA: Diagnosis not present

## 2024-02-26 DIAGNOSIS — J9621 Acute and chronic respiratory failure with hypoxia: Secondary | ICD-10-CM | POA: Diagnosis not present

## 2024-02-26 DIAGNOSIS — M00012 Staphylococcal arthritis, left shoulder: Secondary | ICD-10-CM | POA: Diagnosis not present

## 2024-02-26 DIAGNOSIS — T8459XA Infection and inflammatory reaction due to other internal joint prosthesis, initial encounter: Secondary | ICD-10-CM | POA: Diagnosis not present

## 2024-03-01 ENCOUNTER — Telehealth: Payer: Self-pay

## 2024-03-01 ENCOUNTER — Ambulatory Visit: Admitting: Primary Care

## 2024-03-01 DIAGNOSIS — J189 Pneumonia, unspecified organism: Secondary | ICD-10-CM | POA: Diagnosis not present

## 2024-03-01 DIAGNOSIS — J440 Chronic obstructive pulmonary disease with acute lower respiratory infection: Secondary | ICD-10-CM | POA: Diagnosis not present

## 2024-03-01 DIAGNOSIS — B9562 Methicillin resistant Staphylococcus aureus infection as the cause of diseases classified elsewhere: Secondary | ICD-10-CM | POA: Diagnosis not present

## 2024-03-01 DIAGNOSIS — J849 Interstitial pulmonary disease, unspecified: Secondary | ICD-10-CM

## 2024-03-01 DIAGNOSIS — J9621 Acute and chronic respiratory failure with hypoxia: Secondary | ICD-10-CM | POA: Diagnosis not present

## 2024-03-01 DIAGNOSIS — T8459XA Infection and inflammatory reaction due to other internal joint prosthesis, initial encounter: Secondary | ICD-10-CM | POA: Diagnosis not present

## 2024-03-01 DIAGNOSIS — M00012 Staphylococcal arthritis, left shoulder: Secondary | ICD-10-CM | POA: Diagnosis not present

## 2024-03-01 NOTE — Progress Notes (Deleted)
 @Patient  ID: [name], female    DOB: 06/25/1940, 83 y.o.   MRN: [id number]  No chief complaint on file.   Referring provider: Mercer Clotilda SAUNDERS, MD  HPI: Discussed the use of AI scribe software for clinical note transcription with the patient, who gave verbal consent to proceed.  History of Present Illness  PMH significant for chronic hypoxic respiratory failure on 3L oxygen , ILD, pulmonary fibrosis, COPD, chronic diastolic heart failure, HTN.   Patient has been admitted to the hospital several times over the last couple of months. Most recently admitted on 12/15/23-12/23/23 for septic arthritis. She had been recently discharged for SNF after undergoing hemiarthroplasty of the left shoulder on 11/15/23 and found to be positive for MRSA. She then went to Long term care facility from 12/23/23 for 21 days. She originally presented to [facility] on 7/29 with SOB from SNR and diagnosed with severe sepsis and CAP. She received several courses of antibiotics including daptomycin , zosyn  and vancomycin  which was stopped on 8/10. She was treated with high dose IV steroids and weaned to oral prednisone . She was on Bactrim  prophylaxis MWF during hospital stay. High flow oxygen  weaned 8L with BIPAP prn. She is on lasix  20mg  daily for chronic diastolic heart failure.        Allergies  Allergen Reactions   Codeine Swelling and Other (See Comments)    Facial and leg swelling Chest pain    Remicade [Infliximab] Anaphylaxis    sent me into shock    Zestril [Lisinopril] Swelling and Rash    Face and neck swelling    Ofev  [Nintedanib] Diarrhea    Immunization History  Administered Date(s) Administered   Fluad Quad(high Dose 65+) 02/02/2019, 03/11/2021, 04/01/2022   Fluad Trivalent(High Dose 65+) 03/02/2023   INFLUENZA, HIGH DOSE SEASONAL PF 02/16/2017, 02/11/2018, 01/31/2019, 02/27/2020   Influenza Split 01/29/2012, 03/30/2013, 02/10/2014   Influenza,inj,Quad PF,6+ Mos 02/09/2015, 12/18/2015    Influenza-Unspecified 02/09/2015, 12/18/2015   PFIZER Comirnaty(Gray Top)Covid-19 Tri-Sucrose Vaccine 07/24/2019, 08/14/2019, 02/27/2020   PFIZER(Purple Top)SARS-COV-2 Vaccination 07/17/2019, 08/14/2019   PPD Test 04/23/2015   Pneumococcal Conjugate-13 08/21/2014   Pneumococcal Polysaccharide-23 02/17/2012   Tdap 12/27/2015    Past Medical History:  Diagnosis Date   Acute encephalopathy 03/30/2021   11/12 - 04/03/2021 acute encephalopathy in the context of altered mental status and poor oral intake following increasing pain medicines and associated Klebsiella UTI.     Acute kidney injury superimposed on chronic kidney disease 04/17/2015   11/12 - 04/03/2021 AKI with creatinine of 2.22 and GFR 22 indicating stage IV renal disease in the context of encephalopathy secondary to increased pain medications with decreased oral intake of fluids and nutrition.     Acute on chronic diastolic (congestive) heart failure (HCC)    Anemia    iron  deficiency anemia - secondary to blood loss ( chronic)    Anxiety    Arthritis    endstage changes bilateral knees/bilateral ankles.    Asthma    Carotid artery occlusion    Chest pain of uncertain etiology 01/28/2012   Normal coronaries at cath     Chronic fatigue    Chronic kidney disease    Closed intertrochanteric fracture of hip, left, initial encounter (HCC) 11/29/2016   Closed left hip fracture (HCC)    Closed nondisplaced intertrochanteric fracture of left femur with delayed healing 01/14/2017   Clotting disorder    pt denies this   Contusion of left knee    due to fall 1/14.   COPD (  chronic obstructive pulmonary disease) (HCC)    pulmonary fibrosis   Coronary artery disease    Depression, reactive    Diabetes mellitus    type II    Diastolic dysfunction    Difficulty in walking    Family history of heart disease    Flu 06/26/2023   Generalized muscle weakness    Gout    Hardware complicating wound infection 12/08/2023   High  cholesterol    History of falling    Hypertension    Hypothyroidism    Interstitial lung disease (HCC)    Meningioma of left sphenoid wing involving cavernous sinus (HCC) 02/17/2012   Continue diplopia, left eye pain and left headaches.     Morbid obesity (HCC)    Neuromuscular disorder (HCC)    diabetic neuropathy    Normal coronary arteries    cardiac catheterization performed  10/31/14   Pneumonia    RA (rheumatoid arthritis) (HCC)    has been off methotreaxte since 10/13.   Septic shock (HCC) 06/13/2023   Spinal stenosis of lumbar region    Thyroid  disease    Unspecified lack of coordination    URI (upper respiratory infection)    UTI (urinary tract infection) 03/31/2021   11/12 - 04/03/2021 Klebsiella UTI associated with encephalopathy in the context of AKI.  Status post IV ceftriaxone  followed by oral Keflex  x48 hours.      Tobacco History: Social History   Tobacco Use  Smoking Status Never  Smokeless Tobacco Never   Counseling given: Not Answered   Outpatient Medications Prior to Visit  Medication Sig Dispense Refill   acetaminophen  (TYLENOL ) 500 MG tablet Take 1,000 mg by mouth every 8 (eight) hours as needed (pain).     Adalimumab-bwwd (HADLIMA [place]) Inject into the skin.     albuterol  (VENTOLIN  HFA) 108 (90 Base) MCG/ACT inhaler Inhale 2 puffs into the lungs in the morning and at bedtime.     amLODipine  (NORVASC ) 5 MG tablet Take 1 tablet (5 mg total) by mouth daily. 90 tablet 3   atorvastatin  (LIPITOR) 10 MG tablet Take 1 tablet (10 mg total) by mouth daily. 90 tablet 3   bisacodyl  (DULCOLAX) 10 MG suppository Place 10 mg rectally daily as needed (bowel management).     Cholecalciferol  (VITAMIN D3) 50 MCG (2000 UT) TABS Take 2,000 Units by mouth daily.     cloNIDine  (CATAPRES ) 0.1 MG tablet Take 1 tablet (0.1 mg total) by mouth 3 (three) times daily.     diclofenac  Sodium (VOLTAREN ) 1 % GEL Apply 2 g topically 3 (three) times daily.     DULoxetine  (CYMBALTA ) 60 MG  capsule Take 1 capsule (60 mg total) by mouth daily. 90 capsule 1   enoxaparin  (LOVENOX ) 40 MG/0.4ML injection Inject 0.4 mLs (40 mg total) into the skin daily.     feeding supplement (ENSURE PLUS HIGH PROTEIN) LIQD Take 237 mLs by mouth 2 (two) times daily between meals.     furosemide  (LASIX ) 20 MG tablet Take 1 tablet (20 mg total) by mouth daily. 90 tablet 3   gabapentin  (NEURONTIN ) 300 MG capsule Take 1 capsule (300 mg total) by mouth 2 (two) times daily. 180 capsule 3   guaiFENesin  (MUCINEX ) 600 MG 12 hr tablet Take 2 tablets (1,200 mg total) by mouth 2 (two) times daily.     heparin  lock flush 100 UNIT/ML SOLN injection Inject 5 mLs into the vein daily.     hydrocortisone  (ANUSOL -HC) 2.5 % rectal cream Place 1 Application rectally  2 (two) times daily.     hyoscyamine  (LEVSIN  SL) 0.125 MG SL tablet Place 1 tablet (0.125 mg total) under the tongue every 8 (eight) hours as needed. 30 tablet 1   insulin  aspart (NOVOLOG ) 100 UNIT/ML injection Inject 0-9 Units into the skin every 4 (four) hours.     insulin  glargine (LANTUS  SOLOSTAR) 100 UNIT/ML Solostar Pen Inject 12 Units into the skin at bedtime. 15 mL 5   leptospermum manuka honey (MEDIHONEY) PSTE paste Apply 1 Application topically daily. Interchangeable with TheraHoney Apply thin layer (3 mm) to wound.     levothyroxine  (SYNTHROID ) 50 MCG tablet Take 1 tablet (50 mcg total) by mouth daily. 90 tablet 3   lidocaine  (LIDODERM ) 5 % Place 1 patch onto the skin daily. Remove & Discard patch within 12 hours or as directed by MD 30 patch 1   MAGNESIUM  PO      melatonin 3 MG TABS tablet Take 1 tablet (3 mg total) by mouth at bedtime as needed (insomnia).     methocarbamol  (ROBAXIN ) 500 MG tablet Take 1 tablet (500 mg total) by mouth every 8 (eight) hours as needed for muscle spasms.     metoprolol  tartrate (LOPRESSOR ) 100 MG tablet Take 1 tablet (100 mg total) by mouth 2 (two) times daily. 180 tablet 3   morphine  (MS CONTIN ) 15 MG 12 hr tablet Take  1 tablet (15 mg total) by mouth at bedtime. 30 tablet 0   Multiple Vitamin (MULTIVITAMIN) tablet Take 1 tablet by mouth daily.     nitroGLYCERIN  (NITROSTAT ) 0.4 MG SL tablet Place 1 tablet (0.4 mg total) under the tongue every 5 (five) minutes as needed for chest pain.     ondansetron  (ZOFRAN ) 4 MG/2ML SOLN injection Inject 2 mLs (4 mg total) into the vein every 6 (six) hours as needed for nausea or vomiting.     oxyCODONE -acetaminophen  (PERCOCET) 5-325 MG tablet Take 1 tablet by mouth 2 (two) times daily as needed for moderate pain (pain score 4-6). 60 tablet 0   OXYGEN  Inhale 3 L/min into the lungs at bedtime. As needed during day time     pantoprazole  (PROTONIX ) 40 MG tablet Take 1 tablet (40 mg total) by mouth 2 (two) times daily. 120 tablet 4   predniSONE  (DELTASONE ) 20 MG tablet Take 4 tablets (80 mg total) by mouth daily with breakfast.     sulfamethoxazole -trimethoprim  (BACTRIM  DS) 800-160 MG tablet Take 1 tablet by mouth 3 (three) times a week.     traZODone  (DESYREL ) 50 MG tablet Take 1 tablet (50 mg total) by mouth at bedtime as needed for sleep. 90 tablet 1   No facility-administered medications prior to visit.      Review of Systems  Review of Systems   Physical Exam  There were no vitals taken for this visit. Physical Exam  ***  Lab Results:  CBC    Component Value Date/Time   WBC 5.0 01/12/2024 0537   RBC 3.05 (L) 01/12/2024 0537   HGB 8.8 (L) 01/12/2024 0537   HGB 12.2 10/29/2021 1115   HGB 12.4 02/17/2012 1337   HCT 26.9 (L) 01/12/2024 0537   HCT 38.1 10/29/2021 1115   HCT 37.2 02/17/2012 1337   PLT 104 (L) 01/12/2024 0537   PLT 144 (L) 10/29/2021 1115   MCV 88.2 01/12/2024 0537   MCV 96 10/29/2021 1115   MCV 94.1 02/17/2012 1337   MCH 28.9 01/12/2024 0537   MCHC 32.7 01/12/2024 0537   RDW 16.6 (H) 01/12/2024 0537  RDW 13.3 10/29/2021 1115   RDW 16.0 (H) 02/17/2012 1337   LYMPHSABS 1.8 12/24/2023 0412   LYMPHSABS 1.2 02/17/2012 1337   MONOABS 0.5  12/24/2023 0412   MONOABS 0.4 02/17/2012 1337   EOSABS 0.0 12/24/2023 0412   EOSABS 0.3 02/17/2012 1337   BASOSABS 0.0 12/24/2023 0412   BASOSABS 0.0 02/17/2012 1337    BMET    Component Value Date/Time   NA 123 (L) 01/13/2024 0343   NA 145 (H) 09/29/2022 0921   NA 139 02/17/2012 1337   K 4.2 01/13/2024 0343   K 4.8 02/17/2012 1337   CL 87 (L) 01/13/2024 0343   CL 103 02/17/2012 1337   CO2 26 01/13/2024 0343   CO2 25 02/17/2012 1337   GLUCOSE 118 (H) 01/13/2024 0343   GLUCOSE 112 (H) 02/17/2012 1337   BUN 13 01/13/2024 0343   BUN 25 09/29/2022 0921   BUN 32.0 (H) 02/17/2012 1337   CREATININE 0.61 01/13/2024 0343   CREATININE 1.57 (H) 09/17/2016 1438   CREATININE 1.7 (H) 02/17/2012 1337   CALCIUM  7.6 (L) 01/13/2024 0343   CALCIUM  8.9 02/17/2012 1337   GFRNONAA >60 01/13/2024 0343   GFRNONAA 32 (L) 09/17/2016 1438   GFRAA 49 (L) 11/10/2019 2052   GFRAA 37 (L) 09/17/2016 1438    BNP    Component Value Date/Time   BNP 59.1 12/17/2023 0103    ProBNP    Component Value Date/Time   PROBNP 10.0 09/25/2021 1607    Imaging: No results found.   Assessment & Plan:   No problem-specific Assessment & Plan notes found for this encounter.   1. ILD (interstitial lung disease) (HCC) (Primary)   Assessment and Plan Assessment & Plan        Almarie LELON Ferrari, NP 03/01/2024

## 2024-03-01 NOTE — Telephone Encounter (Signed)
 Copied from CRM [id number]. Topic: General - Other >> Mar 01, 2024  1:12 PM Rilla B wrote: Reason for CRM: Patient calling in to speak to Dr Geronimo or Almarie Ferrari. Patient states she is bed bound and since getting out of the hospital her meds have been increased. She states Dr Geronimo had her in palliative care and she needs to know how she can continue seeing him for care.  She can only leave the house in an ambulance.  Please call patient @ [phone].  ATC X1. LMTCB

## 2024-03-01 NOTE — Telephone Encounter (Signed)
 Pt returning your call.   Please call back thank you.

## 2024-03-02 ENCOUNTER — Telehealth: Payer: Self-pay

## 2024-03-02 DIAGNOSIS — M00012 Staphylococcal arthritis, left shoulder: Secondary | ICD-10-CM | POA: Diagnosis not present

## 2024-03-02 DIAGNOSIS — J440 Chronic obstructive pulmonary disease with acute lower respiratory infection: Secondary | ICD-10-CM | POA: Diagnosis not present

## 2024-03-02 DIAGNOSIS — J189 Pneumonia, unspecified organism: Secondary | ICD-10-CM | POA: Diagnosis not present

## 2024-03-02 DIAGNOSIS — B9562 Methicillin resistant Staphylococcus aureus infection as the cause of diseases classified elsewhere: Secondary | ICD-10-CM | POA: Diagnosis not present

## 2024-03-02 DIAGNOSIS — T8459XA Infection and inflammatory reaction due to other internal joint prosthesis, initial encounter: Secondary | ICD-10-CM | POA: Diagnosis not present

## 2024-03-02 DIAGNOSIS — J9621 Acute and chronic respiratory failure with hypoxia: Secondary | ICD-10-CM | POA: Diagnosis not present

## 2024-03-02 NOTE — Telephone Encounter (Signed)
 Copied from CRM [id number]. Topic: Clinical - Medication Question >> Mar 02, 2024  2:58 PM Mesmerise C wrote: Reason for CRM: Chauncey from [facility] severity 2 warning level reaction to both sulfamethoxazole -trimethoprim  (BACTRIM  DS) 800-160 MG table and hydroxychloroquine , Tod had questions about metoprolol  tartrate (LOPRESSOR ) 100 MG tablet and succinate patient was given would like a call back at [phone]

## 2024-03-02 NOTE — Telephone Encounter (Signed)
 I called the pt and there was no answer- LMTCB   When she calls back see if she could schedule a virtual ov with MR or Beth- if she is wanting to talkk with provider and is bed bound virtual visit would be okay to start with and then will see what is needed for her.

## 2024-03-03 ENCOUNTER — Telehealth: Payer: Self-pay

## 2024-03-03 NOTE — Telephone Encounter (Signed)
 Copied from CRM [id number]. Topic: General - Other >> Mar 01, 2024  1:05 PM [place] J wrote: Reason for CRM: [facility] received a palative care referral and they would like to let Dr. Mercer know that they will be following up.

## 2024-03-03 NOTE — Telephone Encounter (Signed)
 Called and spoke with patient, she already had an appointment for 10/21 at 8:30 am (in office), changed visit to video.  Ok per Dr. Geronimo.  Explained process of video visit to patient, she does have access to a smart phone.  Advised she can either us  mychart to do visit or click on link and it will take her to the visit.  She verbalized understanding.  Nothing further needed.

## 2024-03-07 NOTE — Progress Notes (Unsigned)
    NO SHOW 03/08/2024 Due to failied IT at home

## 2024-03-08 ENCOUNTER — Ambulatory Visit: Admitting: Internal Medicine

## 2024-03-08 DIAGNOSIS — J849 Interstitial pulmonary disease, unspecified: Secondary | ICD-10-CM

## 2024-03-08 NOTE — Patient Instructions (Signed)
 ILD (interstitial lung disease) (HCC)  - clinically stable but cough worse  -worsening couhg - need to rule out allergies  Plan - check cbc with diff and RAST allergy panel 06/02/2023  = monitor purlse ox; if dropping go to ER - monitor off ofev  (due to prior hx with diarrhea) - HRCT supine n 3 months  - spiro and dlco in 3 months - RA medicines Humira, plaquenil  and prednisone  via [facility] - refill Azithromycin  3x/wekk  History of pulmonary embolism June 2023 History of Gl bleed summer 2024 with history of diverticulosos  Recent July 2024 Lower GI Bleed and resultant anemia -> anemia has improved dec 2024 Compleed 13 months of eliquids for PE treatment ending August 2024 and stiopoed due to GI bleed  Plan  --No antifibrotic till  GI issues sorted out ad disease progresses further; (Ofev  to be listed as allergy) =check cbc and d-dimer  06/02/2023 - refer  GI (last seen in hospital July 2024) - hold off eliquis  for now   Followup  - 3 mnths with [name] but after spiro/dlco  = 30 min visit; symptoms score at followup; can look at reepeat d-dime and decide on atleast low dose eliqusi at followup

## 2024-03-09 ENCOUNTER — Telehealth (HOSPITAL_BASED_OUTPATIENT_CLINIC_OR_DEPARTMENT_OTHER): Payer: Self-pay | Admitting: Internal Medicine

## 2024-03-09 DIAGNOSIS — B9562 Methicillin resistant Staphylococcus aureus infection as the cause of diseases classified elsewhere: Secondary | ICD-10-CM | POA: Diagnosis not present

## 2024-03-09 DIAGNOSIS — J189 Pneumonia, unspecified organism: Secondary | ICD-10-CM | POA: Diagnosis not present

## 2024-03-09 DIAGNOSIS — J440 Chronic obstructive pulmonary disease with acute lower respiratory infection: Secondary | ICD-10-CM | POA: Diagnosis not present

## 2024-03-09 DIAGNOSIS — J9621 Acute and chronic respiratory failure with hypoxia: Secondary | ICD-10-CM | POA: Diagnosis not present

## 2024-03-09 DIAGNOSIS — T8459XA Infection and inflammatory reaction due to other internal joint prosthesis, initial encounter: Secondary | ICD-10-CM | POA: Diagnosis not present

## 2024-03-09 DIAGNOSIS — M00012 Staphylococcal arthritis, left shoulder: Secondary | ICD-10-CM | POA: Diagnosis not present

## 2024-03-09 NOTE — Telephone Encounter (Signed)
 Due to new medicare guidelines, patient is unable to have video visits. She was scheduled for an acute visit yesterday, but was unable to join via [place] due to these guidelines. Please advise.

## 2024-03-09 NOTE — Telephone Encounter (Signed)
 I called pt and she answered but could not hear me  She hung up  I called back approx 1 min later and there was no answer I left detailed msg informing her of the below regarding virtual visit and that she should schedule in person visit when she is able to come in

## 2024-03-10 DIAGNOSIS — J440 Chronic obstructive pulmonary disease with acute lower respiratory infection: Secondary | ICD-10-CM | POA: Diagnosis not present

## 2024-03-10 DIAGNOSIS — M00012 Staphylococcal arthritis, left shoulder: Secondary | ICD-10-CM | POA: Diagnosis not present

## 2024-03-10 DIAGNOSIS — B9562 Methicillin resistant Staphylococcus aureus infection as the cause of diseases classified elsewhere: Secondary | ICD-10-CM | POA: Diagnosis not present

## 2024-03-10 DIAGNOSIS — J189 Pneumonia, unspecified organism: Secondary | ICD-10-CM | POA: Diagnosis not present

## 2024-03-10 DIAGNOSIS — J9621 Acute and chronic respiratory failure with hypoxia: Secondary | ICD-10-CM | POA: Diagnosis not present

## 2024-03-10 DIAGNOSIS — T8459XA Infection and inflammatory reaction due to other internal joint prosthesis, initial encounter: Secondary | ICD-10-CM | POA: Diagnosis not present

## 2024-03-10 NOTE — Telephone Encounter (Signed)
 Sonny, this is not an option. There are NO video visits per Medicare guidelines. That's not a [facility] rule, and we cannot schedule them at all as it's a government mandate. Sounds like you're going to have to talk to the son.

## 2024-03-10 NOTE — Telephone Encounter (Unsigned)
 Copied from CRM [id number]. Topic: Clinical - Medication Question >> Mar 10, 2024  2:35 PM Rozanna MATSU wrote: Reason for CRM: Tod [id number]  pts son calling about video visit and advised him of the message in the chart about the medicare guidelines for video visit. He want to if she pays out of pocket for the video visit is possible she is not able to come in to the clinic.

## 2024-03-10 NOTE — Telephone Encounter (Signed)
 MR- pt is home bound and we can not do virtual ov due to new government guidelines. What do you recommend the pt do for care through our office. I guess she could come via EMS but I feel like this would be expensive. I am happy to call and talk to son, but I need to have some advice to give him and need your input please, thank you!

## 2024-03-11 DIAGNOSIS — J9621 Acute and chronic respiratory failure with hypoxia: Secondary | ICD-10-CM | POA: Diagnosis not present

## 2024-03-11 DIAGNOSIS — B9562 Methicillin resistant Staphylococcus aureus infection as the cause of diseases classified elsewhere: Secondary | ICD-10-CM | POA: Diagnosis not present

## 2024-03-11 DIAGNOSIS — J440 Chronic obstructive pulmonary disease with acute lower respiratory infection: Secondary | ICD-10-CM | POA: Diagnosis not present

## 2024-03-11 DIAGNOSIS — J189 Pneumonia, unspecified organism: Secondary | ICD-10-CM | POA: Diagnosis not present

## 2024-03-11 DIAGNOSIS — M00012 Staphylococcal arthritis, left shoulder: Secondary | ICD-10-CM | POA: Diagnosis not present

## 2024-03-11 DIAGNOSIS — T8459XA Infection and inflammatory reaction due to other internal joint prosthesis, initial encounter: Secondary | ICD-10-CM | POA: Diagnosis not present

## 2024-03-14 DIAGNOSIS — B9562 Methicillin resistant Staphylococcus aureus infection as the cause of diseases classified elsewhere: Secondary | ICD-10-CM | POA: Diagnosis not present

## 2024-03-14 DIAGNOSIS — J189 Pneumonia, unspecified organism: Secondary | ICD-10-CM | POA: Diagnosis not present

## 2024-03-14 DIAGNOSIS — T8459XA Infection and inflammatory reaction due to other internal joint prosthesis, initial encounter: Secondary | ICD-10-CM | POA: Diagnosis not present

## 2024-03-14 DIAGNOSIS — J440 Chronic obstructive pulmonary disease with acute lower respiratory infection: Secondary | ICD-10-CM | POA: Diagnosis not present

## 2024-03-14 DIAGNOSIS — M00012 Staphylococcal arthritis, left shoulder: Secondary | ICD-10-CM | POA: Diagnosis not present

## 2024-03-14 DIAGNOSIS — J9621 Acute and chronic respiratory failure with hypoxia: Secondary | ICD-10-CM | POA: Diagnosis not present

## 2024-03-14 NOTE — Telephone Encounter (Signed)
 Unable to see note from [facility].  Also unsure if they reached out the prescribing provider for those meds, the rheumatologist.

## 2024-03-14 NOTE — Telephone Encounter (Signed)
 They have to come via some form of medical transport to the office. Do not know what else to sayy. House call is not feasible unless someone in office does that

## 2024-03-15 DIAGNOSIS — B9562 Methicillin resistant Staphylococcus aureus infection as the cause of diseases classified elsewhere: Secondary | ICD-10-CM | POA: Diagnosis not present

## 2024-03-15 DIAGNOSIS — T8459XA Infection and inflammatory reaction due to other internal joint prosthesis, initial encounter: Secondary | ICD-10-CM | POA: Diagnosis not present

## 2024-03-15 DIAGNOSIS — M00012 Staphylococcal arthritis, left shoulder: Secondary | ICD-10-CM | POA: Diagnosis not present

## 2024-03-15 DIAGNOSIS — J440 Chronic obstructive pulmonary disease with acute lower respiratory infection: Secondary | ICD-10-CM | POA: Diagnosis not present

## 2024-03-15 DIAGNOSIS — J189 Pneumonia, unspecified organism: Secondary | ICD-10-CM | POA: Diagnosis not present

## 2024-03-15 DIAGNOSIS — J9621 Acute and chronic respiratory failure with hypoxia: Secondary | ICD-10-CM | POA: Diagnosis not present

## 2024-03-15 NOTE — Telephone Encounter (Signed)
 Called pt and there was no answer-LMTCB

## 2024-03-16 NOTE — Telephone Encounter (Signed)
 Pt never was seen. Pt is scheduled for Pulmonix in November 2025. Pt does not have any other upcoming appts.

## 2024-03-23 ENCOUNTER — Encounter: Attending: Physical Medicine and Rehabilitation | Admitting: Registered Nurse

## 2024-03-23 ENCOUNTER — Encounter: Payer: Self-pay | Admitting: Registered Nurse

## 2024-03-23 VITALS — BP 152/91 | HR 69 | Ht 64.0 in | Wt 194.0 lb

## 2024-03-23 DIAGNOSIS — M25512 Pain in left shoulder: Secondary | ICD-10-CM | POA: Diagnosis not present

## 2024-03-23 DIAGNOSIS — M25511 Pain in right shoulder: Secondary | ICD-10-CM | POA: Insufficient documentation

## 2024-03-23 DIAGNOSIS — M7061 Trochanteric bursitis, right hip: Secondary | ICD-10-CM | POA: Insufficient documentation

## 2024-03-23 DIAGNOSIS — G8929 Other chronic pain: Secondary | ICD-10-CM | POA: Insufficient documentation

## 2024-03-23 DIAGNOSIS — Z7401 Bed confinement status: Secondary | ICD-10-CM | POA: Diagnosis not present

## 2024-03-23 DIAGNOSIS — M5416 Radiculopathy, lumbar region: Secondary | ICD-10-CM | POA: Diagnosis not present

## 2024-03-23 DIAGNOSIS — R531 Weakness: Secondary | ICD-10-CM | POA: Diagnosis not present

## 2024-03-23 DIAGNOSIS — M170 Bilateral primary osteoarthritis of knee: Secondary | ICD-10-CM | POA: Diagnosis present

## 2024-03-23 DIAGNOSIS — R5381 Other malaise: Secondary | ICD-10-CM | POA: Insufficient documentation

## 2024-03-23 DIAGNOSIS — M7062 Trochanteric bursitis, left hip: Secondary | ICD-10-CM | POA: Diagnosis present

## 2024-03-23 DIAGNOSIS — Z79891 Long term (current) use of opiate analgesic: Secondary | ICD-10-CM | POA: Diagnosis present

## 2024-03-23 DIAGNOSIS — Z5181 Encounter for therapeutic drug level monitoring: Secondary | ICD-10-CM | POA: Insufficient documentation

## 2024-03-23 DIAGNOSIS — I10 Essential (primary) hypertension: Secondary | ICD-10-CM | POA: Diagnosis not present

## 2024-03-23 DIAGNOSIS — G894 Chronic pain syndrome: Secondary | ICD-10-CM | POA: Diagnosis not present

## 2024-03-23 DIAGNOSIS — R5383 Other fatigue: Secondary | ICD-10-CM | POA: Diagnosis not present

## 2024-03-23 NOTE — Progress Notes (Signed)
 Subjective:    Patient ID: [name], female    DOB: [birth year], 83 y.o.   MRN: [id number]  HPI: [name] is a 83 y.o. female who returns for follow up appointment for chronic pain and medication refill. She states her pain is located in her bilateral shoulder, lower back radiating into her right lower extremity, bilateral hips and bilateral knee pain. She rates her pain 4. She is not following a current exercise regimen , she is receiving physical therapy.  [name] Morphine  equivalent is 30.00 MME.   Oral Swab was Performed today.    Pain Inventory Average Pain 7 Pain Right Now 4 My pain is sharp, stabbing, and aching  In the last 24 hours, has pain interfered with the following? General activity 8 Relation with others 8 Enjoyment of life 8 What TIME of day is your pain at its worst? morning  Sleep (in general) Poor  Pain is worse with: walking, bending, sitting, inactivity, standing, and some activites Pain improves with: medication Relief from Meds: 10  Family History  Problem Relation Age of Onset   Diabetes Mother    Heart attack Mother    Hypertension Father    Lung cancer Father    Diabetes Sister    Breast cancer Sister    Breast cancer Sister    Diabetes Brother    Hypertension Brother    Heart disease Brother    Heart attack Brother    Kidney cancer Brother    Gout Brother    Kidney failure Brother        x 5   Esophageal cancer Brother    Stomach cancer Brother    Prostate cancer Brother    Uterine cancer Daughter        with mets   Fibroids Daughter    Hypertension Daughter    Hypertension Daughter    Hypertension Son    Heart murmur Son    Rheum arthritis Maternal Uncle    Alzheimer's disease Neg Hx    Dementia Neg Hx    Social History   Socioeconomic History   Marital status: Married    Spouse name: Not on file   Number of children: 6   Years of education: college   Highest education level: Bachelor's degree (e.g., BA,  AB, BS)  Occupational History   Occupation: retired Engineer, Civil (consulting)  Tobacco Use   Smoking status: Never   Smokeless tobacco: Never  Vaping Use   Vaping status: Never Used  Substance and Sexual Activity   Alcohol  use: No   Drug use: No   Sexual activity: Not Currently  Other Topics Concern   Not on file  Social History Narrative   Patient consumes 2-3 cups coffee per day.   Social Drivers of Corporate Investment Banker Strain: Low Risk  (12/24/2023)   Received from [facility]   Overall Financial Resource Strain (CARDIA)    Difficulty of Paying Living Expenses: Not hard at all  Food Insecurity: No Food Insecurity (12/24/2023)   Received from [facility]   Hunger Vital Sign    Within the past 12 months, you worried that your food would run out before you got the money to buy more.: Never true    Within the past 12 months, the food you bought just didn't last and you didn't have money to get more.: Never true  Transportation Needs: No Transportation Needs (01/13/2024)   Received from [facility]   [facility] SDOH Transportation Source  Has lack of transportation kept you from medical appointments or from getting medications?: No    Has lack of transportation kept you from meetings, work, or from getting things needed for daily living?: No  Physical Activity: Insufficiently Active (10/30/2023)   Exercise Vital Sign    Days of Exercise per Week: 5 days    Minutes of Exercise per Session: 20 min  Stress: Stress Concern Present (01/13/2024)   Received from [facility]   [employer] of Occupational Health - Occupational Stress Questionnaire    Feeling of Stress : To some extent  Social Connections: Moderately Integrated (12/24/2023)   Received from [facility]   Social Connection and Isolation Panel    In a typical week, how many times do you talk on the phone with family, friends, or neighbors?: More than three times a week    How often do you get together with friends or  relatives?: More than three times a week    How often do you attend church or religious services?: 1 to 4 times per year    Do you belong to any clubs or organizations such as church groups, unions, fraternal or athletic groups, or school groups?: No    How often do you attend meetings of the clubs or organizations you belong to?: Never    Are you married, widowed, divorced, separated, never married, or living with a partner?: Married   Past Surgical History:  Procedure Laterality Date   BRAIN SURGERY     Gamma knife 10/13. Needs repeat spring  '14   CARDIAC CATHETERIZATION N/A 10/31/2014   Procedure: Right/Left Heart Cath and Coronary Angiography;  Surgeon: Candyce GORMAN Reek, MD;  Location: [facility] INVASIVE CV LAB;  Service: Cardiovascular;  Laterality: N/A;   COLONOSCOPY WITH PROPOFOL  N/A 04/14/2022   Procedure: COLONOSCOPY WITH PROPOFOL ;  Surgeon: Albertus Gordy HERO, MD;  Location: [facility] ENDOSCOPY;  Service: Gastroenterology;  Laterality: N/A;   CYST EXCISION     2 on face   CYST REMOVAL NECK     ESOPHAGOGASTRODUODENOSCOPY (EGD) WITH PROPOFOL  N/A 09/14/2014   Procedure: ESOPHAGOGASTRODUODENOSCOPY (EGD) WITH PROPOFOL ;  Surgeon: Lamar JONETTA Aho, MD;  Location: [facility] ENDOSCOPY;  Service: Endoscopy;  Laterality: N/A;   INCISION AND DRAINAGE HIP Left 01/16/2017   Procedure: IRRIGATION AND DEBRIDEMENT LEFT HIP;  Surgeon: Vernetta Lonni GRADE, MD;  Location: [facility] ORS;  Service: Orthopedics;  Laterality: Left;   INTRAMEDULLARY (IM) NAIL INTERTROCHANTERIC Left 11/29/2016   Procedure: INTRAMEDULLARY (IM) NAIL INTERTROCHANTRIC;  Surgeon: Vernetta Lonni GRADE, MD;  Location: [facility] OR;  Service: Orthopedics;  Laterality: Left;   LAPAROSCOPIC LOOP COLOSTOMY N/A 07/17/2023   Procedure: LAPAROSCOPIC DIVERTING LOOP COLOSTOMY;  Surgeon: Belinda Cough, MD;  Location: [facility] OR;  Service: General;  Laterality: N/A;   OVARY SURGERY     RIGHT HEART CATH N/A 08/12/2021   Procedure: RIGHT HEART CATH;  Surgeon: Court Dorn PARAS, MD;  Location: [facility] INVASIVE CV LAB;  Service: Cardiovascular;  Laterality: N/A;   SHOULDER HEMI-ARTHROPLASTY Left 11/15/2023   Procedure: HEMIARTHROPLASTY, SHOULDER;  Surgeon: Cristy Bonner DASEN, MD;  Location: [facility] OR;  Service: Orthopedics;  Laterality: Left;  with Block   SHOULDER SURGERY Left    TONSILLECTOMY  age 42   VAGINAL HYSTERECTOMY     VIDEO BRONCHOSCOPY Bilateral 05/31/2013   Procedure: VIDEO BRONCHOSCOPY WITHOUT FLUORO;  Surgeon: Dorethia Cave, MD;  Location: [facility] ENDOSCOPY;  Service: Cardiopulmonary;  Laterality: Bilateral;   video bronscoscopy  05/19/1998   lung   Past Surgical History:  Procedure  Laterality Date   BRAIN SURGERY     Gamma knife 10/13. Needs repeat spring  '14   CARDIAC CATHETERIZATION N/A 10/31/2014   Procedure: Right/Left Heart Cath and Coronary Angiography;  Surgeon: Candyce GORMAN Reek, MD;  Location: [facility] INVASIVE CV LAB;  Service: Cardiovascular;  Laterality: N/A;   COLONOSCOPY WITH PROPOFOL  N/A 04/14/2022   Procedure: COLONOSCOPY WITH PROPOFOL ;  Surgeon: Albertus Gordy HERO, MD;  Location: [facility]OS[facility];  Service: Gastroenterology;  Laterality: N/A;   CYST EXCISION     2 on face   CYST REMOVAL NECK     ESOPHAGOGASTRODUODENOSCOPY (EGD) WITH PROPOFOL  N/A 09/14/2014   Procedure: ESOPHAGOGASTRODUODENOSCOPY (EGD) WITH PROPOFOL ;  Surgeon: Lamar JONETTA Aho, MD;  Location: [facility] ENDOS[facility];  Service: Endoscopy;  Laterality: N/A;   INCISION AND DRAINAGE HIP Left 01/16/2017   Procedure: IRRIGATION AND DEBRIDEMENT LEFT HIP;  Surgeon: Vernetta Lonni GRADE, MD;  Location: [facility] ORS;  Service: Orthopedics;  Laterality: Left;   INTRAMEDULLARY (IM) NAIL INTERTROCHANTERIC Left 11/29/2016   Procedure: INTRAMEDULLARY (IM) NAIL INTERTROCHANTRIC;  Surgeon: Vernetta Lonni GRADE, MD;  Location: [facility] OR;  Service: Orthopedics;  Laterality: Left;   LAPAROSCOPIC LOOP COLOSTOMY N/A 07/17/2023   Procedure: LAPAROSCOPIC DIVERTING LOOP COLOSTOMY;  Surgeon: Belinda Cough, MD;  Location: [facility] OR;   Service: General;  Laterality: N/A;   OVARY SURGERY     RIGHT HEART CATH N/A 08/12/2021   Procedure: RIGHT HEART CATH;  Surgeon: Court Dorn PARAS, MD;  Location: [facility] INVASIVE CV LAB;  Service: Cardiovascular;  Laterality: N/A;   SHOULDER HEMI-ARTHROPLASTY Left 11/15/2023   Procedure: HEMIARTHROPLASTY, SHOULDER;  Surgeon: Cristy Bonner DASEN, MD;  Location: [facility] OR;  Service: Orthopedics;  Laterality: Left;  with Block   SHOULDER SURGERY Left    TONSILLECTOMY  age 45   VAGINAL HYSTERECTOMY     VIDEO BRONCHOSCOPY Bilateral 05/31/2013   Procedure: VIDEO BRONCHOSCOPY WITHOUT FLUORO;  Surgeon: Dorethia Cave, MD;  Location: [facility] ENDOSCOPY;  Service: Cardiopulmonary;  Laterality: Bilateral;   video bronscoscopy  05/19/1998   lung   Past Medical History:  Diagnosis Date   Acute encephalopathy 03/30/2021   11/12 - 04/03/2021 acute encephalopathy in the context of altered mental status and poor oral intake following increasing pain medicines and associated Klebsiella UTI.     Acute kidney injury superimposed on chronic kidney disease 04/17/2015   11/12 - 04/03/2021 AKI with creatinine of 2.22 and GFR 22 indicating stage IV renal disease in the context of encephalopathy secondary to increased pain medications with decreased oral intake of fluids and nutrition.     Acute on chronic diastolic (congestive) heart failure (HCC)    Anemia    iron  deficiency anemia - secondary to blood loss ( chronic)    Anxiety    Arthritis    endstage changes bilateral knees/bilateral ankles.    Asthma    Carotid artery occlusion    Chest pain of uncertain etiology 01/28/2012   Normal coronaries at cath     Chronic fatigue    Chronic kidney disease    Closed intertrochanteric fracture of hip, left, initial encounter (HCC) 11/29/2016   Closed left hip fracture (HCC)    Closed nondisplaced intertrochanteric fracture of left femur with delayed healing 01/14/2017   Clotting disorder    pt denies this   Contusion of left  knee    due to fall 1/14.   COPD (chronic obstructive pulmonary disease) (HCC)    pulmonary fibrosis   Coronary artery disease    Depression, reactive    Diabetes mellitus  type II    Diastolic dysfunction    Difficulty in walking    Family history of heart disease    Flu 06/26/2023   Generalized muscle weakness    Gout    Hardware complicating wound infection 12/08/2023   High cholesterol    History of falling    Hypertension    Hypothyroidism    Interstitial lung disease (HCC)    Meningioma of left sphenoid wing involving cavernous sinus (HCC) 02/17/2012   Continue diplopia, left eye pain and left headaches.     Morbid obesity (HCC)    Neuromuscular disorder (HCC)    diabetic neuropathy    Normal coronary arteries    cardiac catheterization performed  10/31/14   Pneumonia    RA (rheumatoid arthritis) (HCC)    has been off methotreaxte since 10/13.   Septic shock (HCC) 06/13/2023   Spinal stenosis of lumbar region    Thyroid  disease    Unspecified lack of coordination    URI (upper respiratory infection)    UTI (urinary tract infection) 03/31/2021   11/12 - 04/03/2021 Klebsiella UTI associated with encephalopathy in the context of AKI.  Status post IV ceftriaxone  followed by oral Keflex  x48 hours.     BP (!) 160/100 (BP Location: Right Arm, Patient Position: Lying right side, Cuff Size: Large) Comment: 2nd BP reading  Pulse 69   Ht 5' 4 (1.626 m)   Wt 194 lb (88 kg)   SpO2 98%   BMI 33.30 kg/m   Opioid Risk Score:   Fall Risk Score:  `1  Depression screen [facility] 2/9     02/04/2024   10:40 AM 01/29/2024   10:07 AM 11/09/2023    1:56 PM 10/30/2023    4:19 PM 10/15/2023    2:45 PM 06/12/2023   12:05 PM 06/12/2023   12:04 PM  Depression screen PHQ 2/9  Decreased Interest 3 1 1  0 2  2  Down, Depressed, Hopeless 3 1 1  0 1  1  PHQ - 2 Score 6 2 2  0 3  3  Altered sleeping 2   0 1  3  Tired, decreased energy 3      3  Change in appetite 0   0 1  3  Feeling bad or  failure about yourself  0   0 0  1  Trouble concentrating 0   0 0  1  Moving slowly or fidgety/restless 3   0 0  3  Suicidal thoughts 0   0 0  0  PHQ-9 Score 14   0 5  17  Difficult doing work/chores    Not difficult at all Somewhat difficult Somewhat difficult       Review of Systems  Musculoskeletal:  Positive for arthralgias, back pain and myalgias.       Bilateral shoulder, knee pain, right hip pain, bilateral ankle pain, low back pain  All other systems reviewed and are negative.      Objective:   Physical Exam Vitals and nursing note reviewed.  Constitutional:      Appearance: Normal appearance.  Cardiovascular:     Rate and Rhythm: Normal rate and regular rhythm.     Pulses: Normal pulses.     Heart sounds: Normal heart sounds.  Pulmonary:     Effort: Pulmonary effort is normal.     Breath sounds: Normal breath sounds.  Musculoskeletal:     Comments: Normal Muscle Bulk and Muscle Testing Reveals:  Upper Extremities:Right: Decreased  ROM  45 Degrees and Muscle Strength 5/5 Left Upper Extremity: Decreased ROM 30 Degrees and Muscle Strength 4/5 Bilateral AC Joint Tenderness: L>R Thoracic and Lumbar Hypersensitivity Lower Extremities: Decreased ROM and Muscle Strength 4/5 Arrived via Stretcher     Skin:    General: Skin is warm and dry.  Neurological:     Mental Status: She is alert and oriented to person, place, and time.  Psychiatric:        Mood and Affect: Mood normal.        Behavior: Behavior normal.          Assessment & Plan:  . Lumbar spinal stenosis with neurogenic claudication. Associated facet arthropathy/ Lumbar Radiculitis: Continue current medication regimen with Gabapentin  and HEP as tolerated. 03/23/2024 Refilled: MS Contin  15 mg one tablet at bedtime #30 and Oxycodone  5/ 325 mg one tablet twice a day as needed for pain #60. We will continue the opioid monitoring program, this consists of regular clinic visits, examinations, urine drug screen,  pill counts as well as use of [place]  Controlled Substance Reporting system. A 12 month History has been reviewed on the [place]  Controlled Substance Reporting System 03/23/2024 2. Chronic Bilateral Thoracic Back Pain: No complaints today. Continue HEP as Tolerated. Continue to Monitor. 03/23/2024 3 Depression: Continue current medication Regime: Cymbalta  . Continue to monitor.03/23/2024 4. Diabetes mellitus type 2 with polyneuropathy: Continue current medication regime: Gabapentin . 03/23/2024 5. Rheumatoid arthritis and osteoarthritis. Continue current medication Regime.  Voltaren  Gel. Rheumatology Following. 03/23/2024 6. Interstitial lung disease: Pulmonology Following. 03/23/2024. 7. Bilateral Osteoarthritis Knee's:  Continue current medication regimen. Voltaren  Gel. 03/23/2024. 8. Muscle Spasm: Continue current medication regime : Flexeril . Continue to monitor.03/23/2024 9. Bilateral  Greater Trochanteric Tenderness: Continue current treatment with Ice/Heat Therapy. 03/23/2024. 10. Polyarthralgia: Rheumatology Following: Continue to monitor.03/23/2024 11. Morbid Obesitity: Continue Healthy Diet Regime and HEP. Continue to monitor.  03/23/2024 12. Cervicalgia/Cervical Radiculitis: No complaints today. Continue to Alternate with Heat and Ice Therapy. Continue to monitor. 03/23/2024  13. Chronic Left Shoulder Pain: Continue HEP as Tolerated. Continue current medication regimen. Continue to monitor. 03/23/2024     F/U in 2 months

## 2024-03-27 LAB — DRUG TOX MONITOR 1 W/CONF, ORAL FLD
Amphetamines: NEGATIVE ng/mL (ref <10)
Barbiturates: NEGATIVE ng/mL (ref <10)
Benzodiazepines: NEGATIVE ng/mL (ref <0.50)
Buprenorphine: NEGATIVE ng/mL (ref <0.10)
Cocaine: NEGATIVE ng/mL (ref <5.0)
Codeine: NEGATIVE ng/mL (ref <2.5)
Dihydrocodeine: NEGATIVE ng/mL (ref <2.5)
Fentanyl: NEGATIVE ng/mL (ref <0.10)
Heroin Metabolite: NEGATIVE ng/mL (ref <1.0)
Hydrocodone: NEGATIVE ng/mL (ref <2.5)
Hydromorphone: NEGATIVE ng/mL (ref <2.5)
MARIJUANA: NEGATIVE ng/mL (ref <2.5)
MDMA: NEGATIVE ng/mL (ref <10)
Meprobamate: NEGATIVE ng/mL (ref <2.5)
Methadone: NEGATIVE ng/mL (ref <5.0)
Morphine: NEGATIVE ng/mL (ref <2.5)
Nicotine Metabolite: NEGATIVE ng/mL (ref <5.0)
Norhydrocodone: NEGATIVE ng/mL (ref <2.5)
Noroxycodone: 6.7 ng/mL — ABNORMAL HIGH (ref <2.5)
Opiates: POSITIVE ng/mL — AB (ref <2.5)
Oxycodone: 124.8 ng/mL — ABNORMAL HIGH (ref <2.5)
Oxymorphone: NEGATIVE ng/mL (ref <2.5)
Phencyclidine: NEGATIVE ng/mL (ref <10)
Tapentadol: NEGATIVE ng/mL (ref <5.0)
Tramadol: NEGATIVE ng/mL (ref <5.0)
Zolpidem: NEGATIVE ng/mL (ref <5.0)

## 2024-03-27 LAB — DRUG TOX ALC METAB W/CON, ORAL FLD: Alcohol Metabolite: NEGATIVE ng/mL (ref <25)

## 2024-03-29 ENCOUNTER — Encounter

## 2024-03-30 ENCOUNTER — Telehealth: Payer: Self-pay | Admitting: Registered Nurse

## 2024-03-30 DIAGNOSIS — G8929 Other chronic pain: Secondary | ICD-10-CM

## 2024-03-30 DIAGNOSIS — G894 Chronic pain syndrome: Secondary | ICD-10-CM

## 2024-03-30 MED ORDER — OXYCODONE-ACETAMINOPHEN 5-325 MG PO TABS: 1.0000 | ORAL_TABLET | Freq: Two times a day (BID) | ORAL | 0 refills | Status: AC | PRN

## 2024-03-30 MED ORDER — OXYCODONE-ACETAMINOPHEN 5-325 MG PO TABS: 1.0000 | ORAL_TABLET | Freq: Two times a day (BID) | ORAL | 0 refills | Status: DC | PRN

## 2024-03-30 MED ORDER — MORPHINE SULFATE ER 15 MG PO TBCR: 15.0000 mg | EXTENDED_RELEASE_TABLET | Freq: Every day | ORAL | 0 refills | Status: AC

## 2024-03-30 MED ORDER — MORPHINE SULFATE ER 15 MG PO TBCR: 15.0000 mg | EXTENDED_RELEASE_TABLET | Freq: Every day | ORAL | 0 refills | Status: DC

## 2024-03-30 NOTE — Telephone Encounter (Signed)
 PDMP was Reviewed.  MS Contin  and Oxycodone  e-scribed to pharmacy.  Tod Ms. Coles son is aware via My-Chart.

## 2024-04-08 ENCOUNTER — Encounter: Payer: Self-pay | Admitting: Family Medicine

## 2024-04-08 ENCOUNTER — Telehealth (INDEPENDENT_AMBULATORY_CARE_PROVIDER_SITE_OTHER): Admitting: Family Medicine

## 2024-04-08 DIAGNOSIS — E1142 Type 2 diabetes mellitus with diabetic polyneuropathy: Secondary | ICD-10-CM

## 2024-04-08 DIAGNOSIS — J9611 Chronic respiratory failure with hypoxia: Secondary | ICD-10-CM | POA: Diagnosis not present

## 2024-04-08 DIAGNOSIS — M48061 Spinal stenosis, lumbar region without neurogenic claudication: Secondary | ICD-10-CM | POA: Diagnosis not present

## 2024-04-08 DIAGNOSIS — G894 Chronic pain syndrome: Secondary | ICD-10-CM

## 2024-04-08 DIAGNOSIS — M05742 Rheumatoid arthritis with rheumatoid factor of left hand without organ or systems involvement: Secondary | ICD-10-CM

## 2024-04-08 DIAGNOSIS — Z794 Long term (current) use of insulin: Secondary | ICD-10-CM

## 2024-04-08 DIAGNOSIS — J849 Interstitial pulmonary disease, unspecified: Secondary | ICD-10-CM | POA: Diagnosis not present

## 2024-04-08 DIAGNOSIS — M5416 Radiculopathy, lumbar region: Secondary | ICD-10-CM

## 2024-04-08 DIAGNOSIS — R5381 Other malaise: Secondary | ICD-10-CM | POA: Diagnosis not present

## 2024-04-08 DIAGNOSIS — L89322 Pressure ulcer of left buttock, stage 2: Secondary | ICD-10-CM

## 2024-04-08 DIAGNOSIS — M05741 Rheumatoid arthritis with rheumatoid factor of right hand without organ or systems involvement: Secondary | ICD-10-CM

## 2024-04-08 MED ORDER — PREDNISONE 20 MG PO TABS
20.0000 mg | ORAL_TABLET | Freq: Every day | ORAL | 1 refills | Status: AC
Start: 2024-04-08 — End: ?

## 2024-04-08 NOTE — Progress Notes (Signed)
 Virtual Visit via Video Note  I connected with [name]  on 04/08/24 at  4:00 PM EST by a video enabled telemedicine application and verified that I am speaking with the correct person using two identifiers.  Location patient: home Location provider:work or home office Persons participating in the virtual visit: patient, provider, son Tod Audio and video functioning for provider and pt.  I discussed the limitations of evaluation and management by telemedicine and the availability of in person appointments. The patient expressed understanding and agreed to proceed.  Chief Complaint  Patient presents with   Follow-up    Form completion for power wheel chair.  F/u on chronic conditions.    HPI: Pt is an 83 yo female seen for f/u on chronic conditions and for form completion.  Pt endorses pain in back and sides.  Meds helps some. Followed by pain management.  Denies constipation.  Having some bloating.  Appetite is good.  Forms for power wheel chair faxed to clinic from [facility].  Deatrice Journey contact at company.  [name] states trying to get pt a Mobile wheelchair with 6 wheels.  Pt's mobility limited due to decreased strength and chronic health conditions causing pain.  Had pressure ulcers on feet and buttocks due to this.  Working with PT to increase strength and mobility.  Difficult for pt to get to appts due to limited mobility.  Breathing is ok.  Currently on 3L O2 via [place].  Need refill on prednisone .  Currently getting 2 of the 10 mg tabs as ran out.  ROS: See pertinent positives and negatives per HPI.  Past Medical History:  Diagnosis Date   Acute encephalopathy 03/30/2021   11/12 - 04/03/2021 acute encephalopathy in the context of altered mental status and poor oral intake following increasing pain medicines and associated Klebsiella UTI.     Acute kidney injury superimposed on chronic kidney disease 04/17/2015   11/12 - 04/03/2021 AKI with creatinine of  2.22 and GFR 22 indicating stage IV renal disease in the context of encephalopathy secondary to increased pain medications with decreased oral intake of fluids and nutrition.     Acute on chronic diastolic (congestive) heart failure (HCC)    Anemia    iron  deficiency anemia - secondary to blood loss ( chronic)    Anxiety    Arthritis    endstage changes bilateral knees/bilateral ankles.    Asthma    Carotid artery occlusion    Chest pain of uncertain etiology 01/28/2012   Normal coronaries at cath     Chronic fatigue    Chronic kidney disease    Closed intertrochanteric fracture of hip, left, initial encounter (HCC) 11/29/2016   Closed left hip fracture (HCC)    Closed nondisplaced intertrochanteric fracture of left femur with delayed healing 01/14/2017   Clotting disorder    pt denies this   Contusion of left knee    due to fall 1/14.   COPD (chronic obstructive pulmonary disease) (HCC)    pulmonary fibrosis   Coronary artery disease    Depression, reactive    Diabetes mellitus    type II    Diastolic dysfunction    Difficulty in walking    Family history of heart disease    Flu 06/26/2023   Generalized muscle weakness    Gout    Hardware complicating wound infection 12/08/2023   High cholesterol    History of falling    Hypertension    Hypothyroidism  Interstitial lung disease (HCC)    Meningioma of left sphenoid wing involving cavernous sinus (HCC) 02/17/2012   Continue diplopia, left eye pain and left headaches.     Morbid obesity (HCC)    Neuromuscular disorder (HCC)    diabetic neuropathy    Normal coronary arteries    cardiac catheterization performed  10/31/14   Pneumonia    RA (rheumatoid arthritis) (HCC)    has been off methotreaxte since 10/13.   Septic shock (HCC) 06/13/2023   Spinal stenosis of lumbar region    Thyroid  disease    Unspecified lack of coordination    URI (upper respiratory infection)    UTI (urinary tract infection) 03/31/2021   11/12  - 04/03/2021 Klebsiella UTI associated with encephalopathy in the context of AKI.  Status post IV ceftriaxone  followed by oral Keflex  x48 hours.      Past Surgical History:  Procedure Laterality Date   BRAIN SURGERY     Gamma knife 10/13. Needs repeat spring  '14   CARDIAC CATHETERIZATION N/A 10/31/2014   Procedure: Right/Left Heart Cath and Coronary Angiography;  Surgeon: Candyce GORMAN Reek, MD;  Location: [facility] INVASIVE CV LAB;  Service: Cardiovascular;  Laterality: N/A;   COLONOSCOPY WITH PROPOFOL  N/A 04/14/2022   Procedure: COLONOSCOPY WITH PROPOFOL ;  Surgeon: Albertus Gordy HERO, MD;  Location: [facility] ENDOSCOPY;  Service: Gastroenterology;  Laterality: N/A;   CYST EXCISION     2 on face   CYST REMOVAL NECK     ESOPHAGOGASTRODUODENOSCOPY (EGD) WITH PROPOFOL  N/A 09/14/2014   Procedure: ESOPHAGOGASTRODUODENOSCOPY (EGD) WITH PROPOFOL ;  Surgeon: Lamar JONETTA Aho, MD;  Location: [facility] ENDOSCOPY;  Service: Endoscopy;  Laterality: N/A;   INCISION AND DRAINAGE HIP Left 01/16/2017   Procedure: IRRIGATION AND DEBRIDEMENT LEFT HIP;  Surgeon: Vernetta Lonni GRADE, MD;  Location: [facility] ORS;  Service: Orthopedics;  Laterality: Left;   INTRAMEDULLARY (IM) NAIL INTERTROCHANTERIC Left 11/29/2016   Procedure: INTRAMEDULLARY (IM) NAIL INTERTROCHANTRIC;  Surgeon: Vernetta Lonni GRADE, MD;  Location: [facility] OR;  Service: Orthopedics;  Laterality: Left;   LAPAROSCOPIC LOOP COLOSTOMY N/A 07/17/2023   Procedure: LAPAROSCOPIC DIVERTING LOOP COLOSTOMY;  Surgeon: Belinda Cough, MD;  Location: [facility] OR;  Service: General;  Laterality: N/A;   OVARY SURGERY     RIGHT HEART CATH N/A 08/12/2021   Procedure: RIGHT HEART CATH;  Surgeon: Court Dorn PARAS, MD;  Location: [facility] INVASIVE CV LAB;  Service: Cardiovascular;  Laterality: N/A;   SHOULDER HEMI-ARTHROPLASTY Left 11/15/2023   Procedure: HEMIARTHROPLASTY, SHOULDER;  Surgeon: Cristy Bonner DASEN, MD;  Location: [facility] OR;  Service: Orthopedics;  Laterality: Left;  with Block   SHOULDER SURGERY Left     TONSILLECTOMY  age 25   VAGINAL HYSTERECTOMY     VIDEO BRONCHOSCOPY Bilateral 05/31/2013   Procedure: VIDEO BRONCHOSCOPY WITHOUT FLUORO;  Surgeon: Dorethia Cave, MD;  Location: [facility] ENDOSCOPY;  Service: Cardiopulmonary;  Laterality: Bilateral;   video bronscoscopy  05/19/1998   lung    Family History  Problem Relation Age of Onset   Diabetes Mother    Heart attack Mother    Hypertension Father    Lung cancer Father    Diabetes Sister    Breast cancer Sister    Breast cancer Sister    Diabetes Brother    Hypertension Brother    Heart disease Brother    Heart attack Brother    Kidney cancer Brother    Gout Brother    Kidney failure Brother        x 5   Esophageal cancer  Brother    Stomach cancer Brother    Prostate cancer Brother    Uterine cancer Daughter        with mets   Fibroids Daughter    Hypertension Daughter    Hypertension Daughter    Hypertension Son    Heart murmur Son    Rheum arthritis Maternal Uncle    Alzheimer's disease Neg Hx    Dementia Neg Hx     Current Outpatient Medications:    acetaminophen  (TYLENOL ) 500 MG tablet, Take 1,000 mg by mouth every 8 (eight) hours as needed (pain)., Disp: , Rfl:    Adalimumab-bwwd (HADLIMA [place]), Inject into the skin., Disp: , Rfl:    [Paused] albuterol  (VENTOLIN  HFA) 108 (90 Base) MCG/ACT inhaler, Inhale 2 puffs into the lungs in the morning and at bedtime., Disp: , Rfl:    amLODipine  (NORVASC ) 5 MG tablet, Take 1 tablet (5 mg total) by mouth daily., Disp: 90 tablet, Rfl: 3   atorvastatin  (LIPITOR) 10 MG tablet, Take 1 tablet (10 mg total) by mouth daily., Disp: 90 tablet, Rfl: 3   azithromycin  (ZITHROMAX ) 250 MG tablet, Take by mouth daily. 3 times a week, Disp: , Rfl:    [Paused] Cholecalciferol  (VITAMIN D3) 50 MCG (2000 UT) TABS, Take 2,000 Units by mouth daily., Disp: , Rfl:    DULoxetine  (CYMBALTA ) 60 MG capsule, Take 1 capsule (60 mg total) by mouth daily., Disp: 90 capsule, Rfl: 1   [Paused] feeding  supplement (ENSURE PLUS HIGH PROTEIN) LIQD, Take 237 mLs by mouth 2 (two) times daily between meals., Disp: , Rfl:    furosemide  (LASIX ) 20 MG tablet, Take 1 tablet (20 mg total) by mouth daily., Disp: 90 tablet, Rfl: 3   gabapentin  (NEURONTIN ) 300 MG capsule, Take 1 capsule (300 mg total) by mouth 2 (two) times daily., Disp: 180 capsule, Rfl: 3   guaiFENesin  (MUCINEX ) 600 MG 12 hr tablet, Take 2 tablets (1,200 mg total) by mouth 2 (two) times daily., Disp: , Rfl:    [Paused] hydrocortisone  (ANUSOL -HC) 2.5 % rectal cream, Place 1 Application rectally 2 (two) times daily., Disp: , Rfl:    insulin  glargine (LANTUS  SOLOSTAR) 100 UNIT/ML Solostar Pen, Inject 12 Units into the skin at bedtime., Disp: 15 mL, Rfl: 5   leflunomide  (ARAVA ) 20 MG tablet, Take 20 mg by mouth daily., Disp: , Rfl:    levothyroxine  (SYNTHROID ) 50 MCG tablet, Take 1 tablet (50 mcg total) by mouth daily., Disp: 90 tablet, Rfl: 3   [Paused] lidocaine  (LIDODERM ) 5 %, Place 1 patch onto the skin daily. Remove & Discard patch within 12 hours or as directed by MD, Disp: 30 patch, Rfl: 1   MAGNESIUM  PO, , Disp: , Rfl:    methocarbamol  (ROBAXIN ) 500 MG tablet, Take 1 tablet (500 mg total) by mouth every 8 (eight) hours as needed for muscle spasms., Disp: , Rfl:    metoprolol  tartrate (LOPRESSOR ) 100 MG tablet, Take 1 tablet (100 mg total) by mouth 2 (two) times daily., Disp: 180 tablet, Rfl: 3   morphine  (MS CONTIN ) 15 MG 12 hr tablet, Take 1 tablet (15 mg total) by mouth at bedtime., Disp: 30 tablet, Rfl: 0   nitroGLYCERIN  (NITROSTAT ) 0.4 MG SL tablet, Place 1 tablet (0.4 mg total) under the tongue every 5 (five) minutes as needed for chest pain., Disp: , Rfl:    olmesartan  (BENICAR ) 40 MG tablet, Take 40 mg by mouth daily., Disp: , Rfl:    oxyCODONE -acetaminophen  (PERCOCET) 5-325 MG tablet, Take 1 tablet  by mouth 2 (two) times daily as needed for moderate pain (pain score 4-6)., Disp: 60 tablet, Rfl: 0   OXYGEN , Inhale 3 L/min into the  lungs at bedtime. As needed during day time, Disp: , Rfl:    pantoprazole  (PROTONIX ) 40 MG tablet, Take 1 tablet (40 mg total) by mouth 2 (two) times daily., Disp: 120 tablet, Rfl: 4   predniSONE  (DELTASONE ) 20 MG tablet, Take 4 tablets (80 mg total) by mouth daily with breakfast., Disp: , Rfl:    traZODone  (DESYREL ) 50 MG tablet, Take 1 tablet (50 mg total) by mouth at bedtime as needed for sleep., Disp: 90 tablet, Rfl: 1   [Paused] bisacodyl  (DULCOLAX) 10 MG suppository, Place 10 mg rectally daily as needed (bowel management). (Patient not taking: Reported on 04/08/2024), Disp: , Rfl:    cloNIDine  (CATAPRES ) 0.1 MG tablet, Take 1 tablet (0.1 mg total) by mouth 3 (three) times daily. (Patient not taking: Reported on 04/08/2024), Disp: , Rfl:    [Paused] diclofenac  Sodium (VOLTAREN ) 1 % GEL, Apply 2 g topically 3 (three) times daily., Disp: , Rfl:    enoxaparin  (LOVENOX ) 40 MG/0.4ML injection, Inject 0.4 mLs (40 mg total) into the skin daily. (Patient not taking: Reported on 04/08/2024), Disp: , Rfl:    [Paused] heparin  lock flush 100 UNIT/ML SOLN injection, Inject 5 mLs into the vein daily., Disp: , Rfl:    [Paused] hyoscyamine  (LEVSIN  SL) 0.125 MG SL tablet, Place 1 tablet (0.125 mg total) under the tongue every 8 (eight) hours as needed. (Patient not taking: Reported on 04/08/2024), Disp: 30 tablet, Rfl: 1   insulin  aspart (NOVOLOG ) 100 UNIT/ML injection, Inject 0-9 Units into the skin every 4 (four) hours. (Patient not taking: Reported on 04/08/2024), Disp: , Rfl:    [Paused] leptospermum manuka honey (MEDIHONEY) PSTE paste, Apply 1 Application topically daily. Interchangeable with TheraHoney Apply thin layer (3 mm) to wound. (Patient not taking: Reported on 04/08/2024), Disp: , Rfl:    melatonin 3 MG TABS tablet, Take 1 tablet (3 mg total) by mouth at bedtime as needed (insomnia)., Disp: , Rfl:    [Paused] Multiple Vitamin (MULTIVITAMIN) tablet, Take 1 tablet by mouth daily. (Patient not taking:  Reported on 04/08/2024), Disp: , Rfl:    ondansetron  (ZOFRAN ) 4 MG/2ML SOLN injection, Inject 2 mLs (4 mg total) into the vein every 6 (six) hours as needed for nausea or vomiting., Disp: , Rfl:    sulfamethoxazole -trimethoprim  (BACTRIM  DS) 800-160 MG tablet, Take 1 tablet by mouth 3 (three) times a week. (Patient not taking: Reported on 04/08/2024), Disp: , Rfl:   EXAM:  VITALS per patient if applicable: On 3 L O2 via [place].  GENERAL: alert, oriented, appears uncomfortable laying in hospital bed but well, nontoxic, and in no acute distress  HEENT: atraumatic, conjunctiva clear, no obvious abnormalities on inspection of external nose and ears  NECK: normal movements of the head and neck  LUNGS: On 3 L O2 via [place] inspection no signs of respiratory distress, breathing rate appears normal, no obvious gross SOB, gasping or wheezing  CV: no obvious cyanosis  MS: moves all visible extremities without noticeable abnormality  PSYCH/NEURO: pleasant and cooperative, no obvious depression or anxiety, speech and thought processing grossly intact  ASSESSMENT AND PLAN:  Discussed the following assessment and plan:  Debility  ILD (interstitial lung disease) (HCC) - Plan: predniSONE  (DELTASONE ) 20 MG tablet  Chronic pain syndrome  Spinal stenosis of lumbar region with radiculopathy  Rheumatoid arthritis involving both hands with positive rheumatoid  factor (HCC)  Chronic hypoxic respiratory failure (HCC)  Type 2 diabetes mellitus with diabetic polyneuropathy, with long-term current use of insulin  (HCC)  Stage II pressure ulcer of left buttock (HCC)  Power wheelchair for in home use advised given debility, chronic pain, spinal stenosis, history of pressure ulcer etc which severely limit patient's MRADLs.  Forms completed, to be faxed back.    Prednisone  refilled.  Sent to mail order pharmacy per request.  Dose increased to 20 mg after L shoulder surgery per family.  Unable to see increased  dose in chart.    Chronic resp failure and ILD stable.  Continue 3L O2 via [place] continuously.  Continue current meds per [facility] pulmonology.  ED precautions given for decreased pO2/increased O2 requirement.  Continue prednisone .  Chronic pain multifactorial including RA and spinal stenosis with radiculopathy.  Decreased mobility increasing pain.  Exercises at bedside encouraged.  Continue follow-up with PMNR for pain management.  DM II controlled.  Hgb A1C 5.7% on 01/12/24.  Continue lantus  12 units at bedtime.  SA insulin  held since last hospitalization.  Continue to monitor bs.  m  Frequent turns while in bed.  F/u prn in 3-4 months.  I discussed the assessment and treatment plan with the patient. The patient was provided an opportunity to ask questions and all were answered. The patient agreed with the plan and demonstrated an understanding of the instructions.   The patient was advised to call back or seek an in-person evaluation if the symptoms worsen or if the condition fails to improve as anticipated.   Clotilda JONELLE Single, MD

## 2024-04-22 ENCOUNTER — Telehealth: Payer: Self-pay

## 2024-04-22 NOTE — Telephone Encounter (Signed)
 Copied from CRM [id number]. Topic: General - Other >> Apr 22, 2024  1:13 PM Macario HERO wrote: Reason for CRM: Crystal from [employer] called said she received a fax from us  and the chart note was cut off; and just need us  to refax it back. Phone: [phone] -  Fax: [phone]

## 2024-04-22 NOTE — Telephone Encounter (Signed)
 re-faxed

## 2024-04-25 NOTE — Telephone Encounter (Signed)
 Resent

## 2024-04-25 NOTE — Telephone Encounter (Unsigned)
 Copied from CRM [id number]. Topic: General - Other >> Apr 25, 2024  8:34 AM Carlyon D wrote: Reason for CRM: Crystal from [facility] [employer] called said she received a fax from us  and the chart note was cut off; and just need us  to refax it back. She is asking if the chart notes can be faxed back separate as they keep getting cut off when the whole packet is being faxed only pg 1 of 7 is being received . Please fax chart notes separate  Phone: [phone] -  Fax: [phone]

## 2024-04-26 DIAGNOSIS — M25551 Pain in right hip: Secondary | ICD-10-CM | POA: Diagnosis not present

## 2024-04-26 DIAGNOSIS — M48061 Spinal stenosis, lumbar region without neurogenic claudication: Secondary | ICD-10-CM | POA: Diagnosis not present

## 2024-04-26 DIAGNOSIS — I130 Hypertensive heart and chronic kidney disease with heart failure and stage 1 through stage 4 chronic kidney disease, or unspecified chronic kidney disease: Secondary | ICD-10-CM | POA: Diagnosis not present

## 2024-04-26 DIAGNOSIS — J8417 Interstitial lung disease with progressive fibrotic phenotype in diseases classified elsewhere: Secondary | ICD-10-CM | POA: Diagnosis not present

## 2024-04-26 DIAGNOSIS — F419 Anxiety disorder, unspecified: Secondary | ICD-10-CM | POA: Diagnosis not present

## 2024-04-26 DIAGNOSIS — R54 Age-related physical debility: Secondary | ICD-10-CM | POA: Diagnosis not present

## 2024-04-26 DIAGNOSIS — I509 Heart failure, unspecified: Secondary | ICD-10-CM | POA: Diagnosis not present

## 2024-04-26 DIAGNOSIS — N1831 Chronic kidney disease, stage 3a: Secondary | ICD-10-CM | POA: Diagnosis not present

## 2024-04-26 DIAGNOSIS — R1031 Right lower quadrant pain: Secondary | ICD-10-CM | POA: Diagnosis not present

## 2024-04-26 DIAGNOSIS — E1140 Type 2 diabetes mellitus with diabetic neuropathy, unspecified: Secondary | ICD-10-CM | POA: Diagnosis not present

## 2024-04-26 DIAGNOSIS — K219 Gastro-esophageal reflux disease without esophagitis: Secondary | ICD-10-CM | POA: Diagnosis not present

## 2024-04-26 DIAGNOSIS — E1122 Type 2 diabetes mellitus with diabetic chronic kidney disease: Secondary | ICD-10-CM | POA: Diagnosis not present

## 2024-05-19 LAB — LAB REPORT - SCANNED: EGFR: 88

## 2024-05-23 ENCOUNTER — Encounter: Admitting: Registered Nurse

## 2024-05-23 ENCOUNTER — Emergency Department (HOSPITAL_COMMUNITY)
Admission: EM | Admit: 2024-05-23 | Discharge: 2024-05-23 | Disposition: A | Discharge status: Home / Self Care | Attending: Emergency Medicine | Admitting: Emergency Medicine

## 2024-05-23 ENCOUNTER — Encounter (HOSPITAL_COMMUNITY): Payer: Self-pay

## 2024-05-23 ENCOUNTER — Emergency Department (HOSPITAL_COMMUNITY)

## 2024-05-23 ENCOUNTER — Telehealth: Payer: Self-pay | Admitting: Registered Nurse

## 2024-05-23 DIAGNOSIS — Y92009 Unspecified place in unspecified non-institutional (private) residence as the place of occurrence of the external cause: Secondary | ICD-10-CM | POA: Diagnosis not present

## 2024-05-23 DIAGNOSIS — J449 Chronic obstructive pulmonary disease, unspecified: Secondary | ICD-10-CM | POA: Insufficient documentation

## 2024-05-23 DIAGNOSIS — I10 Essential (primary) hypertension: Secondary | ICD-10-CM | POA: Diagnosis not present

## 2024-05-23 DIAGNOSIS — W19XXXA Unspecified fall, initial encounter: Secondary | ICD-10-CM

## 2024-05-23 DIAGNOSIS — Z794 Long term (current) use of insulin: Secondary | ICD-10-CM | POA: Diagnosis not present

## 2024-05-23 DIAGNOSIS — W1830XA Fall on same level, unspecified, initial encounter: Secondary | ICD-10-CM | POA: Insufficient documentation

## 2024-05-23 DIAGNOSIS — M25551 Pain in right hip: Secondary | ICD-10-CM | POA: Insufficient documentation

## 2024-05-23 DIAGNOSIS — E119 Type 2 diabetes mellitus without complications: Secondary | ICD-10-CM | POA: Insufficient documentation

## 2024-05-23 DIAGNOSIS — N3000 Acute cystitis without hematuria: Secondary | ICD-10-CM | POA: Diagnosis not present

## 2024-05-23 DIAGNOSIS — M549 Dorsalgia, unspecified: Secondary | ICD-10-CM | POA: Diagnosis present

## 2024-05-23 DIAGNOSIS — R0781 Pleurodynia: Secondary | ICD-10-CM | POA: Insufficient documentation

## 2024-05-23 LAB — CBC WITH DIFFERENTIAL/PLATELET
Abs Immature Granulocytes: 0.12 K/uL — ABNORMAL HIGH (ref 0.00–0.07)
Basophils Absolute: 0.1 K/uL (ref 0.0–0.1)
Basophils Relative: 1 %
Eosinophils Absolute: 0.1 K/uL (ref 0.0–0.5)
Eosinophils Relative: 1 %
HCT: 37 % (ref 36.0–46.0)
Hemoglobin: 11.7 g/dL — ABNORMAL LOW (ref 12.0–15.0)
Immature Granulocytes: 1 %
Lymphocytes Relative: 35 %
Lymphs Abs: 3.6 K/uL (ref 0.7–4.0)
MCH: 30.8 pg (ref 26.0–34.0)
MCHC: 31.6 g/dL (ref 30.0–36.0)
MCV: 97.4 fL (ref 80.0–100.0)
Monocytes Absolute: 1 K/uL (ref 0.1–1.0)
Monocytes Relative: 10 %
Neutro Abs: 5.5 K/uL (ref 1.7–7.7)
Neutrophils Relative %: 52 %
Platelets: 157 K/uL (ref 150–400)
RBC: 3.8 MIL/uL — ABNORMAL LOW (ref 3.87–5.11)
RDW: 14.7 % (ref 11.5–15.5)
WBC: 10.4 K/uL (ref 4.0–10.5)
nRBC: 0 % (ref 0.0–0.2)

## 2024-05-23 LAB — URINALYSIS, ROUTINE W REFLEX MICROSCOPIC
Bilirubin Urine: NEGATIVE
Glucose, UA: NEGATIVE mg/dL
Hgb urine dipstick: NEGATIVE
Ketones, ur: NEGATIVE mg/dL
Nitrite: NEGATIVE
Protein, ur: 100 mg/dL — AB
Specific Gravity, Urine: 1.031 — ABNORMAL HIGH (ref 1.005–1.030)
pH: 5 (ref 5.0–8.0)

## 2024-05-23 LAB — COMPREHENSIVE METABOLIC PANEL WITH GFR
ALT: 32 U/L (ref 0–44)
AST: 27 U/L (ref 15–41)
Albumin: 3.4 g/dL — ABNORMAL LOW (ref 3.5–5.0)
Alkaline Phosphatase: 71 U/L (ref 38–126)
Anion gap: 8 (ref 5–15)
BUN: 14 mg/dL (ref 8–23)
CO2: 34 mmol/L — ABNORMAL HIGH (ref 22–32)
Calcium: 8.4 mg/dL — ABNORMAL LOW (ref 8.9–10.3)
Chloride: 98 mmol/L (ref 98–111)
Creatinine, Ser: 0.72 mg/dL (ref 0.44–1.00)
GFR, Estimated: >60 mL/min
Glucose, Bld: 124 mg/dL — ABNORMAL HIGH (ref 70–99)
Potassium: 3.7 mmol/L (ref 3.5–5.1)
Sodium: 139 mmol/L (ref 135–145)
Total Bilirubin: 0.5 mg/dL (ref 0.0–1.2)
Total Protein: 5.4 g/dL — ABNORMAL LOW (ref 6.5–8.1)

## 2024-05-23 MED ORDER — CEPHALEXIN 250 MG/5ML PO SUSR: 250.0000 mg | Freq: Four times a day (QID) | ORAL | 0 refills | Status: AC

## 2024-05-23 MED ORDER — MORPHINE SULFATE (PF) 4 MG/ML IV SOLN
4.0000 mg | Freq: Once | INTRAVENOUS | Status: AC
  Administered 2024-05-23: 4 mg via INTRAMUSCULAR
  Filled 2024-05-23: qty 1

## 2024-05-23 NOTE — Discharge Instructions (Addendum)
 It was a pleasure taking care of you today. You were seen in the Emergency Department for evaluation of rib and hip pain after a fall. Your work-up was reassuring. Your x-ray showed no evidence of acute fracture or dislocation.  However, your urinalysis is consistent with a urinary tract infection.  As discussed, I am prescribing you with a 5-day course of antibiotics.  You will need to take these antibiotics 4 times a day as written on the prescription.  It is important that you finish taking all the antibiotics, even if your symptoms improve.  With regard to your fall at home today, I recommend continue taking the morphine  you have been prescribed for pain. Refer to the attached documentation for further management of your symptoms. Follow up with your PCP if your symptoms continue.  Please return to the ER if you experience chest pain, trouble breathing, intractable nausea/vomiting or any other life threatening illnesses.

## 2024-05-23 NOTE — ED Notes (Signed)
 Ptar called

## 2024-05-23 NOTE — ED Provider Notes (Signed)
 " [facility] Provider Note   CSN: [id number] Arrival date & time: 05/23/24  9348     Patient presents with: Fall   [name] is a 84 y.o. female with past medical history of hypertension, rheumatoid arthritis, ILD, diabetes, spinal stenosis, chronic hypoxic respiratory failure, HFpEF, COPD, who presents emergency department for evaluation after a fall.  History provided primarily by patient's son, who is at bedside.  Son states that the patient has been primarily bedridden for the last 1 year and has very limited mobility at baseline.  He reports that over the last 2 weeks, patient has been complaining of right sided back pain, making her mobility even more limited.  This morning around 545, he did receive a call from his stepfather who said that the patient had fallen out of bed.  When the son arrived, the patient was found prone.  She denies hitting her head.  She is reporting right-sided rib pain as well as right-sided back/hip pain.  Patient does not take any blood thinners.  Patient does take 15 mg of extended release morphine  daily for management of chronic pain.  Her last dose was last night prior to bedtime.   Fall       Prior to Admission medications  Medication Sig Start Date End Date Taking? Authorizing Provider  cephALEXin  (KEFLEX ) 250 MG/5ML suspension Take 5 mLs (250 mg total) by mouth 4 (four) times daily for 5 days. 05/23/24 05/28/24 Yes Karysa Heft, Marry RAMAN, PA-C  acetaminophen  (TYLENOL ) 500 MG tablet Take 1,000 mg by mouth every 8 (eight) hours as needed (pain).    [provider]  Adalimumab-bwwd (HADLIMA [place]) Inject into the skin.    [provider]  [Paused] albuterol  (VENTOLIN  HFA) 108 (90 Base) MCG/ACT inhaler Inhale 2 puffs into the lungs in the morning and at bedtime. Wait to take this until your doctor or other care provider tells you to start again.    [provider]  amLODipine  (NORVASC ) 5  MG tablet Take 1 tablet (5 mg total) by mouth daily. 02/15/24   Mercer Clotilda SAUNDERS, MD  atorvastatin  (LIPITOR) 10 MG tablet Take 1 tablet (10 mg total) by mouth daily. 02/15/24   Mercer Clotilda SAUNDERS, MD  [Paused] bisacodyl  (DULCOLAX) 10 MG suppository Place 10 mg rectally daily as needed (bowel management). Patient not taking: Reported on 04/08/2024 Wait to take this until your doctor or other care provider tells you to start again.    [provider]  [Paused] Cholecalciferol  (VITAMIN D3) 50 MCG (2000 UT) TABS Take 2,000 Units by mouth daily. Wait to take this until your doctor or other care provider tells you to start again.    [provider]  cloNIDine  (CATAPRES ) 0.1 MG tablet Take 1 tablet (0.1 mg total) by mouth 3 (three) times daily. Patient not taking: Reported on 04/08/2024 12/23/23   Gomes, Adriana, DO  [Paused] diclofenac  Sodium (VOLTAREN ) 1 % GEL Apply 2 g topically 3 (three) times daily. Wait to take this until your doctor or other care provider tells you to start again. 08/04/23   Angiulli, Toribio PARAS, PA-C  DULoxetine  (CYMBALTA ) 60 MG capsule Take 1 capsule (60 mg total) by mouth daily. 02/15/24   Mercer Clotilda SAUNDERS, MD  enoxaparin  (LOVENOX ) 40 MG/0.4ML injection Inject 0.4 mLs (40 mg total) into the skin daily. Patient not taking: Reported on 04/08/2024 12/24/23   Gomes, Adriana, DO  [Paused] feeding supplement (ENSURE PLUS HIGH PROTEIN) LIQD Take 237  mLs by mouth 2 (two) times daily between meals. Wait to take this until your doctor or other care provider tells you to start again. 11/22/23   Darci Pore, MD  furosemide  (LASIX ) 20 MG tablet Take 1 tablet (20 mg total) by mouth daily. 02/04/24   Mercer Clotilda SAUNDERS, MD  gabapentin  (NEURONTIN ) 300 MG capsule Take 1 capsule (300 mg total) by mouth 2 (two) times daily. 02/15/24   Mercer Clotilda SAUNDERS, MD  guaiFENesin  (MUCINEX ) 600 MG 12 hr tablet Take 2 tablets (1,200 mg total) by mouth 2 (two) times daily. 08/04/23   Angiulli, Toribio PARAS,  PA-C  [Paused] heparin  lock flush 100 UNIT/ML SOLN injection Inject 5 mLs into the vein daily. Wait to take this until your doctor or other care provider tells you to start again. 11/23/23 03/23/24  [provider]  [Paused] hydrocortisone  (ANUSOL -HC) 2.5 % rectal cream Place 1 Application rectally 2 (two) times daily. Wait to take this until your doctor or other care provider tells you to start again.    [provider]  [Paused] hyoscyamine  (LEVSIN  SL) 0.125 MG SL tablet Place 1 tablet (0.125 mg total) under the tongue every 8 (eight) hours as needed. Patient not taking: Reported on 04/08/2024 Wait to take this until your doctor or other care provider tells you to start again. 11/04/23   Zehr, Jessica D, PA-C  insulin  aspart (NOVOLOG ) 100 UNIT/ML injection Inject 0-9 Units into the skin every 4 (four) hours. Patient not taking: Reported on 04/08/2024 01/29/24   Mercer Clotilda SAUNDERS, MD  insulin  glargine (LANTUS  SOLOSTAR) 100 UNIT/ML Solostar Pen Inject 12 Units into the skin at bedtime. 02/04/24   Mercer Clotilda SAUNDERS, MD  leflunomide  (ARAVA ) 20 MG tablet Take 20 mg by mouth daily.    [provider]  [Paused] leptospermum manuka honey (MEDIHONEY) PSTE paste Apply 1 Application topically daily. Interchangeable with TheraHoney Apply thin layer (3 mm) to wound. Patient not taking: Reported on 04/08/2024 Wait to take this until your doctor or other care provider tells you to start again. 08/15/23   Rai, Ripudeep MARLA, MD  levothyroxine  (SYNTHROID ) 50 MCG tablet Take 1 tablet (50 mcg total) by mouth daily. 02/15/24   Mercer Clotilda SAUNDERS, MD  [Paused] lidocaine  (LIDODERM ) 5 % Place 1 patch onto the skin daily. Remove & Discard patch within 12 hours or as directed by MD Wait to take this until your doctor or other care provider tells you to start again. 11/09/23   Debby Fidela CROME, NP  MAGNESIUM  PO     [provider]  melatonin 3 MG TABS tablet Take 1 tablet (3 mg total) by mouth at  bedtime as needed (insomnia). 12/23/23   Gomes, Adriana, DO  methocarbamol  (ROBAXIN ) 500 MG tablet Take 1 tablet (500 mg total) by mouth every 8 (eight) hours as needed for muscle spasms. 12/23/23   Gomes, Adriana, DO  metoprolol  tartrate (LOPRESSOR ) 100 MG tablet Take 1 tablet (100 mg total) by mouth 2 (two) times daily. 02/15/24   Mercer Clotilda SAUNDERS, MD  morphine  (MS CONTIN ) 15 MG 12 hr tablet Take 1 tablet (15 mg total) by mouth at bedtime. 03/30/24   Debby Fidela CROME, NP  [Paused] Multiple Vitamin (MULTIVITAMIN) tablet Take 1 tablet by mouth daily. Patient not taking: Reported on 04/08/2024 Wait to take this until your doctor or other care provider tells you to start again.    [provider]  nitroGLYCERIN  (NITROSTAT ) 0.4 MG SL tablet Place 1 tablet (0.4 mg total) under  the tongue every 5 (five) minutes as needed for chest pain. 08/04/23   Angiulli, Toribio PARAS, PA-C  olmesartan  (BENICAR ) 40 MG tablet Take 40 mg by mouth daily.    [provider]  ondansetron  (ZOFRAN ) 4 MG/2ML SOLN injection Inject 2 mLs (4 mg total) into the vein every 6 (six) hours as needed for nausea or vomiting. 12/23/23   Gomes, Adriana, DO  oxyCODONE -acetaminophen  (PERCOCET) 5-325 MG tablet Take 1 tablet by mouth 2 (two) times daily as needed for moderate pain (pain score 4-6). 03/30/24   Debby Fidela CROME, NP  OXYGEN  Inhale 3 L/min into the lungs at bedtime. As needed during day time    [provider]  pantoprazole  (PROTONIX ) 40 MG tablet Take 1 tablet (40 mg total) by mouth 2 (two) times daily. 06/23/23   Mercer Clotilda SAUNDERS, MD  predniSONE  (DELTASONE ) 20 MG tablet Take 4 tablets (80 mg total) by mouth daily with breakfast. 12/24/23   Janna Ferrier, DO  predniSONE  (DELTASONE ) 20 MG tablet Take 1 tablet (20 mg total) by mouth daily with breakfast. 04/08/24   Mercer Clotilda SAUNDERS, MD  traZODone  (DESYREL ) 50 MG tablet Take 1 tablet (50 mg total) by mouth at bedtime as needed for sleep. 02/15/24   Mercer Clotilda SAUNDERS, MD     Allergies: Codeine, Remicade [infliximab], Zestril [lisinopril], and Ofev  [nintedanib]    Review of Systems  Musculoskeletal:  Positive for arthralgias.    Updated Vital Signs BP 108/77   Pulse 88   Temp (!) 97.4 F (36.3 C) (Oral)   Resp 20   Ht 5' 4 (1.626 m)   Wt 88 kg   SpO2 100%   BMI 33.30 kg/m   Physical Exam Vitals and nursing note reviewed.  Constitutional:      Appearance: Normal appearance.  HENT:     Head: Normocephalic and atraumatic.     Mouth/Throat:     Mouth: Mucous membranes are moist.  Eyes:     General: No scleral icterus.       Right eye: No discharge.        Left eye: No discharge.     Conjunctiva/sclera: Conjunctivae normal.  Cardiovascular:     Rate and Rhythm: Normal rate and regular rhythm.     Pulses: Normal pulses.  Pulmonary:     Effort: Pulmonary effort is normal.     Breath sounds: Normal breath sounds.  Abdominal:     General: There is no distension.     Tenderness: There is no abdominal tenderness.  Musculoskeletal:        General: Tenderness present. No deformity.     Cervical back: Normal range of motion.     Comments: Patient is tender on right side of the ribs.  Patient also with pain to palpation on her right hip and right side of the back.  Skin:    General: Skin is warm and dry.     Capillary Refill: Capillary refill takes less than 2 seconds.  Neurological:     Mental Status: She is alert.     Motor: No weakness.  Psychiatric:        Mood and Affect: Mood normal.     (all labs ordered are listed, but only abnormal results are displayed) Labs Reviewed  URINALYSIS, ROUTINE W REFLEX MICROSCOPIC - Abnormal; Notable for the following components:      Result Value   Color, Urine AMBER (*)    APPearance CLOUDY (*)    Specific Gravity, Urine 1.031 (*)  Protein, ur 100 (*)    Leukocytes,Ua SMALL (*)    Bacteria, UA MANY (*)    All other components within normal limits  CBC WITH DIFFERENTIAL/PLATELET - Abnormal;  Notable for the following components:   RBC 3.80 (*)    Hemoglobin 11.7 (*)    Abs Immature Granulocytes 0.12 (*)    All other components within normal limits  COMPREHENSIVE METABOLIC PANEL WITH GFR - Abnormal; Notable for the following components:   CO2 34 (*)    Glucose, Bld 124 (*)    Calcium  8.4 (*)    Total Protein 5.4 (*)    Albumin  3.4 (*)    All other components within normal limits  URINE CULTURE    EKG: None  Radiology: DG Chest 1 View Result Date: 05/23/2024 CLINICAL DATA:  Patient was found down.  Status post fall. EXAM: CHEST  1 VIEW COMPARISON:  12/23/2023 FINDINGS: Interval improvement in lung aeration. Linear bands of chronic atelectasis or scarring are noted in the left mid lung. Slightly nodular component in these changes warrants continued follow-up. Right lung clear. The cardio pericardial silhouette is enlarged. Bones are demineralized. Degenerative changes noted right shoulder. Status post left shoulder replacement. IMPRESSION: Interval improvement in lung aeration with linear bands of chronic atelectasis or scarring in the left mid lung. Slightly nodular component in these changes warrants continued follow-up. No new acute cardiopulmonary findings. Electronically Signed   By: Camellia Candle M.D.   On: 05/23/2024 08:06   DG Pelvis 1-2 Views Result Date: 05/23/2024 EXAM: 1 or 2 VIEW(S) XRAY OF THE PELVIS 05/23/2024 07:42:45 AM COMPARISON: CT abdomen and pelvis 12/15/2023. CLINICAL HISTORY: 809823 Fall [id number] FINDINGS: BONES AND JOINTS: There is osteopenia. There is no AP evidence of pelvic fractures or diastasis. Both proximal femurs appear intact. There is moderate arthrosis at the hips with remote hip nailing on the left partially visible. There is enthesopathic change of the pelvis. Hypertrophic change of the superior acetabula. LOWER LUMBAR SPINE: Degenerative change lower lumbar spine. SOFT TISSUES: Multiple pelvic phleboliths. IMPRESSION: 1. No displaced pelvic fracture  or diastasis. 2. Osteopenia, degenerative and postsurgical changes . Electronically signed by: Francis Quam MD 05/23/2024 08:00 AM EST RP Workstation: [id number]     Procedures   Medications Ordered in the ED  morphine  (PF) 4 MG/ML injection 4 mg (4 mg Intramuscular Given 05/23/24 0723)                                   Medical Decision Making Amount and/or Complexity of Data Reviewed Labs: ordered. Radiology: ordered.  Risk Prescription drug management.   This patient presents to the ED for concern of right sided rib and back/flank pain after a fall, this involves an extensive number of treatment options, and is a complaint that carries with it a high risk of complications and morbidity.   Differential diagnosis includes: Rib fractures, contusion, pelvic fracture, hip dislocation, UTI, pyelonephritis, cauda equina syndrome  Co morbidities:  history of chronic back pain, primarily bedridden  Lab Tests:  I Ordered, and personally interpreted labs.  The pertinent results include:    - UA: Many bacteria, small leukocytes.  UA obtained via In-N-Out cath  Imaging Studies:  I ordered imaging studies including chest x-ray, pelvic x-ray I independently visualized and interpreted imaging which showed no acute abnormality I agree with the radiologist interpretation  Cardiac Monitoring/ECG:  The patient was maintained on a cardiac monitor.  I personally viewed and interpreted the cardiac monitored which showed an underlying rhythm of: Sinus rhythm  Medicines ordered and prescription drug management:  I ordered medication including  Medications  morphine  (PF) 4 MG/ML injection 4 mg (4 mg Intramuscular Given 05/23/24 0723)   for pain control Reevaluation of the patient after these medicines showed that the patient improved I have reviewed the patients home medicines and have made adjustments as needed  Test Considered:   none  Critical Interventions:   none  Consultations  Obtained: None  Problem List / ED Course:     ICD-10-CM   1. Fall in home, initial encounter  W19.XXXA    Y92.009     2. Acute cystitis without hematuria  N30.00       MDM: 84 year old female who presents emergency department for evaluation after a fall.  Patient with right-sided rib and right sided hip/back pain.  I gave patient 4 mg of morphine  for pain control and ordered a chest x-ray and pelvic x-ray.  X-ray shows no acute fracture or dislocation.  No bladder or bowel incontinence, so less likely cauda equina.  Additionally, because patient has been complaining of right-sided back pain for the last 2 weeks, I ordered a UA to rule out UTI as cause of patient's symptoms.  Nursing staff obtained an In-N-Out catheter as patient is unable to get on the bedpan without significant assistance.  Her UA was consistent with a mild UTI.  I did prescribe the patient with 5 days of Keflex  outpatient for symptom management.  I also advised both the patient and her son that she may continue taking the morphine  she has been otherwise prescribed for pain management.  Recommendation for patient to follow-up with her PCP if her symptoms get worse.  Patient and her son verbalized understanding to this.  Patient's vital signs are stable.  Patient is appropriate for discharge at this time.   Dispostion:  After consideration of the diagnostic results and the patients response to treatment, I feel that the patient would benefit from antibiotics and supportive care.    Final diagnoses:  Fall in home, initial encounter  Acute cystitis without hematuria    ED Discharge Orders          Ordered    cephALEXin  (KEFLEX ) 250 MG/5ML suspension  4 times daily        05/23/24 1144               [name] 05/23/24 1149    Freddi Hamilton, MD 05/23/24 1227  "

## 2024-05-23 NOTE — Telephone Encounter (Signed)
 Pt daughter called and states that pt was rushed to the hospital this morning

## 2024-05-23 NOTE — Progress Notes (Deleted)
 "  Subjective:    Patient ID: [name], female    DOB: 1940-11-04, 84 y.o.   MRN: [id number]  HPI  Pain Inventory Average Pain {NUMBERS; 0-10:5044} Pain Right Now {NUMBERS; 0-10:5044} My pain is {PAIN DESCRIPTION:21022940}  In the last 24 hours, has pain interfered with the following? General activity {NUMBERS; 0-10:5044} Relation with others {NUMBERS; 0-10:5044} Enjoyment of life {NUMBERS; 0-10:5044} What TIME of day is your pain at its worst? {time of day:24191} Sleep (in general) {BHH GOOD/FAIR/POOR:22877}  Pain is worse with: {ACTIVITIES:21022942} Pain improves with: {PAIN IMPROVES TPUY:78977056} Relief from Meds: {NUMBERS; 0-10:5044}  Family History  Problem Relation Age of Onset   Diabetes Mother    Heart attack Mother    Hypertension Father    Lung cancer Father    Diabetes Sister    Breast cancer Sister    Breast cancer Sister    Diabetes Brother    Hypertension Brother    Heart disease Brother    Heart attack Brother    Kidney cancer Brother    Gout Brother    Kidney failure Brother        x 5   Esophageal cancer Brother    Stomach cancer Brother    Prostate cancer Brother    Uterine cancer Daughter        with mets   Fibroids Daughter    Hypertension Daughter    Hypertension Daughter    Hypertension Son    Heart murmur Son    Rheum arthritis Maternal Uncle    Alzheimer's disease Neg Hx    Dementia Neg Hx    Social History   Socioeconomic History   Marital status: Married    Spouse name: Not on file   Number of children: 6   Years of education: college   Highest education level: Bachelor's degree (e.g., BA, AB, BS)  Occupational History   Occupation: retired Engineer, Civil (consulting)  Tobacco Use   Smoking status: Never   Smokeless tobacco: Never  Vaping Use   Vaping status: Never Used  Substance and Sexual Activity   Alcohol  use: No   Drug use: No   Sexual activity: Not Currently  Other Topics Concern   Not on file  Social History Narrative    Patient consumes 2-3 cups coffee per day.   Social Drivers of Health   Tobacco Use: Low Risk (04/08/2024)   Patient History    Smoking Tobacco Use: Never    Smokeless Tobacco Use: Never    Passive Exposure: Not on file  Financial Resource Strain: Low Risk  (12/24/2023)   Received from [facility]   Overall Financial Resource Strain (CARDIA)    Difficulty of Paying Living Expenses: Not hard at all  Food Insecurity: No Food Insecurity (12/24/2023)   Received from [facility]   Epic    Within the past 12 months, you worried that your food would run out before you got the money to buy more.: Never true    Within the past 12 months, the food you bought just didn't last and you didn't have money to get more.: Never true  Transportation Needs: No Transportation Needs (01/13/2024)   Received from [facility]   [facility] SDOH Transportation Source    Has lack of transportation kept you from medical appointments or from getting medications?: No    Has lack of transportation kept you from meetings, work, or from getting things needed for daily living?: No  Physical Activity: Insufficiently Active (10/30/2023)   Exercise Vital  Sign    Days of Exercise per Week: 5 days    Minutes of Exercise per Session: 20 min  Stress: Stress Concern Present (01/13/2024)   Received from [facility]   [facility] of Occupational Health - Occupational Stress Questionnaire    Feeling of Stress : To some extent  Social Connections: Moderately Integrated (12/24/2023)   Received from [facility]   Social Connection and Isolation Panel    In a typical week, how many times do you talk on the phone with family, friends, or neighbors?: More than three times a week    How often do you get together with friends or relatives?: More than three times a week    How often do you attend church or religious services?: 1 to 4 times per year    Do you belong to any clubs or organizations such as church groups, unions,  fraternal or athletic groups, or school groups?: No    How often do you attend meetings of the clubs or organizations you belong to?: Never    Are you married, widowed, divorced, separated, never married, or living with a partner?: Married  Depression (PHQ2-9): High Risk (02/04/2024)   Depression (PHQ2-9)    PHQ-2 Score: 14  Alcohol  Screen: Low Risk (10/30/2023)   Alcohol  Screen    Last Alcohol  Screening Score (AUDIT): 0  Housing: Low Risk  (12/24/2023)   Received from [facility]   Epic    In the last 12 months, was there a time when you were not able to pay the mortgage or rent on time?: No    In the past 12 months, how many times have you moved where you were living?: 0    At any time in the past 12 months, were you homeless or living in a shelter (including now)?: No  Utilities: Not At Risk (12/24/2023)   Received from [facility]   [facility] Utilities    Threatened with loss of utilities: No  Health Literacy: Adequate Health Literacy (10/30/2023)   B1300 Health Literacy    Frequency of need for help with medical instructions: Never   Past Surgical History:  Procedure Laterality Date   BRAIN SURGERY     Gamma knife 10/13. Needs repeat spring  '14   CARDIAC CATHETERIZATION N/A 10/31/2014   Procedure: Right/Left Heart Cath and Coronary Angiography;  Surgeon: Candyce GORMAN Reek, MD;  Location: [facility] INVASIVE CV LAB;  Service: Cardiovascular;  Laterality: N/A;   COLONOSCOPY WITH PROPOFOL  N/A 04/14/2022   Procedure: COLONOSCOPY WITH PROPOFOL ;  Surgeon: Albertus Gordy HERO, MD;  Location: [facility] ENDOSCOPY;  Service: Gastroenterology;  Laterality: N/A;   CYST EXCISION     2 on face   CYST REMOVAL NECK     ESOPHAGOGASTRODUODENOSCOPY (EGD) WITH PROPOFOL  N/A 09/14/2014   Procedure: ESOPHAGOGASTRODUODENOSCOPY (EGD) WITH PROPOFOL ;  Surgeon: Lamar JONETTA Aho, MD;  Location: [facility] ENDOSCOPY;  Service: Endoscopy;  Laterality: N/A;   INCISION AND DRAINAGE HIP Left 01/16/2017   Procedure: IRRIGATION AND DEBRIDEMENT  LEFT HIP;  Surgeon: Vernetta Lonni GRADE, MD;  Location: [facility] ORS;  Service: Orthopedics;  Laterality: Left;   INTRAMEDULLARY (IM) NAIL INTERTROCHANTERIC Left 11/29/2016   Procedure: INTRAMEDULLARY (IM) NAIL INTERTROCHANTRIC;  Surgeon: Vernetta Lonni GRADE, MD;  Location: [facility] OR;  Service: Orthopedics;  Laterality: Left;   LAPAROSCOPIC LOOP COLOSTOMY N/A 07/17/2023   Procedure: LAPAROSCOPIC DIVERTING LOOP COLOSTOMY;  Surgeon: Belinda Cough, MD;  Location: [facility] OR;  Service: General;  Laterality: N/A;   OVARY SURGERY  RIGHT HEART CATH N/A 08/12/2021   Procedure: RIGHT HEART CATH;  Surgeon: Court Dorn PARAS, MD;  Location[facility] INVASIVE CV LAB;  Service: Cardiovascular;  Laterality: N/A;   SHOULDER HEMI-ARTHROPLASTY Left 11/15/2023   Procedure: HEMIARTHROPLASTY, SHOULDER;  Surgeon: Cristy Bonner DASEN, MD;  Location: [facility] OR;  Service: Orthopedics;  Laterality: Left;  with Block   SHOULDER SURGERY Left    TONSILLECTOMY  age 72   VAGINAL HYSTERECTOMY     VIDEO BRONCHOSCOPY Bilateral 05/31/2013   Procedure: VIDEO BRONCHOSCOPY WITHOUT FLUORO;  Surgeon: Dorethia Cave, MD;  Location: [facility] ENDOSCOPY;  Service: Cardiopulmonary;  Laterality: Bilateral;   video bronscoscopy  05/19/1998   lung   Past Surgical History:  Procedure Laterality Date   BRAIN SURGERY     Gamma knife 10/13. Needs repeat spring  '14   CARDIAC CATHETERIZATION N/A 10/31/2014   Procedure: Right/Left Heart Cath and Coronary Angiography;  Surgeon: Candyce GORMAN Reek, MD;  Location: [facility] INVASIVE CV LAB;  Service: Cardiovascular;  Laterality: N/A;   COLONOSCOPY WITH PROPOFOL  N/A 04/14/2022   Procedure: COLONOSCOPY WITH PROPOFOL ;  Surgeon: Albertus Gordy HERO, MD;  Location: [facility] ENDOSCOPY;  Service: Gastroenterology;  Laterality: N/A;   CYST EXCISION     2 on face   CYST REMOVAL NECK     ESOPHAGOGASTRODUODENOSCOPY (EGD) WITH PROPOFOL  N/A 09/14/2014   Procedure: ESOPHAGOGASTRODUODENOSCOPY (EGD) WITH PROPOFOL ;  Surgeon: Lamar JONETTA Aho, MD;   Location: [facility] ENDOSCOPY;  Service: Endoscopy;  Laterality: N/A;   INCISION AND DRAINAGE HIP Left 01/16/2017   Procedure: IRRIGATION AND DEBRIDEMENT LEFT HIP;  Surgeon: Vernetta Lonni GRADE, MD;  Location: [facility] ORS;  Service: Orthopedics;  Laterality: Left;   INTRAMEDULLARY (IM) NAIL INTERTROCHANTERIC Left 11/29/2016   Procedure: INTRAMEDULLARY (IM) NAIL INTERTROCHANTRIC;  Surgeon: Vernetta Lonni GRADE, MD;  Location: [facility] OR;  Service: Orthopedics;  Laterality: Left;   LAPAROSCOPIC LOOP COLOSTOMY N/A 07/17/2023   Procedure: LAPAROSCOPIC DIVERTING LOOP COLOSTOMY;  Surgeon: Belinda Cough, MD;  Location: [facility] OR;  Service: General;  Laterality: N/A;   OVARY SURGERY     RIGHT HEART CATH N/A 08/12/2021   Procedure: RIGHT HEART CATH;  Surgeon: Court Dorn PARAS, MD;  Location: [facility] INVASIVE CV LAB;  Service: Cardiovascular;  Laterality: N/A;   SHOULDER HEMI-ARTHROPLASTY Left 11/15/2023   Procedure: HEMIARTHROPLASTY, SHOULDER;  Surgeon: Cristy Bonner DASEN, MD;  Location: [facility] OR;  Service: Orthopedics;  Laterality: Left;  with Block   SHOULDER SURGERY Left    TONSILLECTOMY  age 38   VAGINAL HYSTERECTOMY     VIDEO BRONCHOSCOPY Bilateral 05/31/2013   Procedure: VIDEO BRONCHOSCOPY WITHOUT FLUORO;  Surgeon: Dorethia Cave, MD;  Location: [facility] ENDOSCOPY;  Service: Cardiopulmonary;  Laterality: Bilateral;   video bronscoscopy  05/19/1998   lung   Past Medical History:  Diagnosis Date   Acute encephalopathy 03/30/2021   11/12 - 04/03/2021 acute encephalopathy in the context of altered mental status and poor oral intake following increasing pain medicines and associated Klebsiella UTI.     Acute kidney injury superimposed on chronic kidney disease 04/17/2015   11/12 - 04/03/2021 AKI with creatinine of 2.22 and GFR 22 indicating stage IV renal disease in the context of encephalopathy secondary to increased pain medications with decreased oral intake of fluids and nutrition.     Acute on chronic diastolic (congestive)  heart failure (HCC)    Anemia    iron  deficiency anemia - secondary to blood loss ( chronic)    Anxiety    Arthritis    endstage changes bilateral knees/bilateral ankles.  Asthma    Carotid artery occlusion    Chest pain of uncertain etiology 01/28/2012   Normal coronaries at cath     Chronic fatigue    Chronic kidney disease    Closed intertrochanteric fracture of hip, left, initial encounter (HCC) 11/29/2016   Closed left hip fracture (HCC)    Closed nondisplaced intertrochanteric fracture of left femur with delayed healing 01/14/2017   Clotting disorder    pt denies this   Contusion of left knee    due to fall 1/14.   COPD (chronic obstructive pulmonary disease) (HCC)    pulmonary fibrosis   Coronary artery disease    Depression, reactive    Diabetes mellitus    type II    Diastolic dysfunction    Difficulty in walking    Family history of heart disease    Flu 06/26/2023   Generalized muscle weakness    Gout    Hardware complicating wound infection 12/08/2023   High cholesterol    History of falling    Hypertension    Hypothyroidism    Interstitial lung disease (HCC)    Meningioma of left sphenoid wing involving cavernous sinus (HCC) 02/17/2012   Continue diplopia, left eye pain and left headaches.     Morbid obesity (HCC)    Neuromuscular disorder (HCC)    diabetic neuropathy    Normal coronary arteries    cardiac catheterization performed  10/31/14   Pneumonia    RA (rheumatoid arthritis) (HCC)    has been off methotreaxte since 10/13.   Septic shock (HCC) 06/13/2023   Spinal stenosis of lumbar region    Thyroid  disease    Unspecified lack of coordination    URI (upper respiratory infection)    UTI (urinary tract infection) 03/31/2021   11/12 - 04/03/2021 Klebsiella UTI associated with encephalopathy in the context of AKI.  Status post IV ceftriaxone  followed by oral Keflex  x48 hours.     There were no vitals taken for this visit.  Opioid Risk Score:    Fall Risk Score:  `1  Depression screen [facility] 2/9     02/04/2024   10:40 AM 01/29/2024   10:07 AM 11/09/2023    1:56 PM 10/30/2023    4:19 PM 10/15/2023    2:45 PM 06/12/2023   12:05 PM 06/12/2023   12:04 PM  Depression screen PHQ 2/9  Decreased Interest 3 1 1  0 2  2  Down, Depressed, Hopeless 3 1 1  0 1  1  PHQ - 2 Score 6 2 2  0 3  3  Altered sleeping 2   0 1  3  Tired, decreased energy 3      3  Change in appetite 0   0 1  3  Feeling bad or failure about yourself  0   0 0  1  Trouble concentrating 0   0 0  1  Moving slowly or fidgety/restless 3   0 0  3  Suicidal thoughts 0   0 0  0  PHQ-9 Score 14    0  5   17   Difficult doing work/chores    Not difficult at all Somewhat difficult Somewhat difficult      Data saved with a previous flowsheet row definition    Review of Systems     Objective:   Physical Exam        Assessment & Plan:    "

## 2024-05-23 NOTE — ED Notes (Signed)
 Patient transported to X-ray

## 2024-05-23 NOTE — ED Triage Notes (Signed)
 BIB EMS from home. Fell at home, unknown time, unknown how long she was on the floor. Denies hitting her head but she is complaining of left shoulder and left sided back pain. No other injuries.

## 2024-05-23 NOTE — ED Notes (Signed)
 Phlebotomy to collect outstanding labs.

## 2024-05-26 LAB — URINE CULTURE: Culture: 10000 — AB

## 2024-05-27 ENCOUNTER — Telehealth (HOSPITAL_BASED_OUTPATIENT_CLINIC_OR_DEPARTMENT_OTHER): Payer: Self-pay

## 2024-05-27 NOTE — Telephone Encounter (Signed)
 Post ED Visit - Positive Culture Follow-up  Culture report reviewed by antimicrobial stewardship pharmacist: [name] [x]  Elma Fail, Pharm.D. []  Venetia Gully, Pharm.D., BCPS AQ-ID []  Garrel Crews, Pharm.D., BCPS []  Almarie Lunger, [address].D., BCPS []  [place], [address].D., BCPS, AAHIVP []  Rosaline Bihari, Pharm.D., BCPS, AAHIVP []  Vernell Meier, PharmD, BCPS []  Latanya Hint, PharmD, BCPS []  Donald Medley, PharmD, BCPS []  Rocky Bold, PharmD []  Dorothyann Alert, PharmD, BCPS []  Morene Babe, PharmD  [facility] []  Rosaline Edison, PharmD []  Romona Bliss, PharmD []  Dolphus Roller, PharmD []  Veva Seip, Rph []  Vernell Daunt) Leonce, PharmD []  Eva Allis, PharmD []  Rosaline Millet, PharmD []  Iantha Batch, PharmD []  Arvin Gauss, PharmD []  Wanda Hasting, PharmD []  Ronal Rav, PharmD []  Rocky Slade, PharmD []  Bard Jeans, PharmD   Positive urine culture Treated with Cephalexin , organism sensitive to the same and no further patient follow-up is required at this time.  Ruth Camelia Elbe 05/27/2024, 9:06 AM

## 2024-11-04 ENCOUNTER — Ambulatory Visit
